# Patient Record
Sex: Male | Born: 1980 | Race: Black or African American | Hispanic: No | Marital: Single | Smoking: Former smoker
Health system: Southern US, Community
[De-identification: ages and names within clinical notes are randomized; demographics above are authoritative.]

## PROBLEM LIST (undated history)

## (undated) VITALS — BP 125/74 | HR 63 | Temp 98.1°F | Resp 18 | Ht 72.0 in | Wt 176.1 lb

## (undated) DIAGNOSIS — D72829 Elevated white blood cell count, unspecified: Secondary | ICD-10-CM

## (undated) DIAGNOSIS — R0902 Hypoxemia: Secondary | ICD-10-CM

## (undated) DIAGNOSIS — Z8619 Personal history of other infectious and parasitic diseases: Secondary | ICD-10-CM

## (undated) DIAGNOSIS — Z9114 Patient's other noncompliance with medication regimen: Secondary | ICD-10-CM

## (undated) DIAGNOSIS — F39 Unspecified mood [affective] disorder: Secondary | ICD-10-CM

## (undated) DIAGNOSIS — D5701 Hb-SS disease with acute chest syndrome: Secondary | ICD-10-CM

## (undated) DIAGNOSIS — I272 Pulmonary hypertension, unspecified: Secondary | ICD-10-CM

## (undated) DIAGNOSIS — E876 Hypokalemia: Secondary | ICD-10-CM

## (undated) DIAGNOSIS — F1290 Cannabis use, unspecified, uncomplicated: Secondary | ICD-10-CM

## (undated) DIAGNOSIS — D5700 Hb-SS disease with crisis, unspecified: Secondary | ICD-10-CM

## (undated) DIAGNOSIS — Z7722 Contact with and (suspected) exposure to environmental tobacco smoke (acute) (chronic): Secondary | ICD-10-CM

## (undated) DIAGNOSIS — Z87891 Personal history of nicotine dependence: Secondary | ICD-10-CM

## (undated) DIAGNOSIS — IMO0001 Reserved for inherently not codable concepts without codable children: Secondary | ICD-10-CM

## (undated) DIAGNOSIS — Q8901 Asplenia (congenital): Secondary | ICD-10-CM

## (undated) DIAGNOSIS — D730 Hyposplenism: Secondary | ICD-10-CM

## (undated) DIAGNOSIS — I10 Essential (primary) hypertension: Secondary | ICD-10-CM

## (undated) DIAGNOSIS — Z789 Other specified health status: Secondary | ICD-10-CM

## (undated) DIAGNOSIS — D571 Sickle-cell disease without crisis: Secondary | ICD-10-CM

## (undated) DIAGNOSIS — Z86711 Personal history of pulmonary embolism: Secondary | ICD-10-CM

## (undated) DIAGNOSIS — I82C11 Acute embolism and thrombosis of right internal jugular vein: Secondary | ICD-10-CM

## (undated) DIAGNOSIS — Z5189 Encounter for other specified aftercare: Secondary | ICD-10-CM

## (undated) DIAGNOSIS — D75839 Thrombocytosis, unspecified: Secondary | ICD-10-CM

## (undated) DIAGNOSIS — Z91148 Patient's other noncompliance with medication regimen for other reason: Secondary | ICD-10-CM

## (undated) DIAGNOSIS — I639 Cerebral infarction, unspecified: Secondary | ICD-10-CM

## (undated) DIAGNOSIS — I38 Endocarditis, valve unspecified: Secondary | ICD-10-CM

## (undated) DIAGNOSIS — D473 Essential (hemorrhagic) thrombocythemia: Secondary | ICD-10-CM

## (undated) DIAGNOSIS — Z7901 Long term (current) use of anticoagulants: Secondary | ICD-10-CM

## (undated) DIAGNOSIS — I248 Other forms of acute ischemic heart disease: Secondary | ICD-10-CM

## (undated) DIAGNOSIS — M8700 Idiopathic aseptic necrosis of unspecified bone: Secondary | ICD-10-CM

## (undated) HISTORY — PX: CHOLECYSTECTOMY: SHX55

## (undated) HISTORY — PX: UMBILICAL HERNIA REPAIR: SHX196

## (undated) HISTORY — DX: Other forms of acute ischemic heart disease: I24.8

## (undated) HISTORY — DX: Hb-SS disease with acute chest syndrome: D57.01

## (undated) HISTORY — PX: OTHER SURGICAL HISTORY: SHX169

---
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
## 1999-02-04 ENCOUNTER — Encounter: Payer: Self-pay | Admitting: Emergency Medicine

## 1999-02-05 ENCOUNTER — Encounter: Admission: RE | Admit: 1999-02-05 | Discharge: 1999-02-05 | Payer: Self-pay | Admitting: Family Medicine

## 1999-02-05 ENCOUNTER — Observation Stay (HOSPITAL_COMMUNITY): Admission: EM | Admit: 1999-02-05 | Discharge: 1999-02-05 | Payer: Self-pay | Admitting: Emergency Medicine

## 1999-02-08 ENCOUNTER — Encounter: Admission: RE | Admit: 1999-02-08 | Discharge: 1999-02-08 | Payer: Self-pay | Admitting: Family Medicine

## 1999-04-17 ENCOUNTER — Emergency Department (HOSPITAL_COMMUNITY): Admission: EM | Admit: 1999-04-17 | Discharge: 1999-04-17 | Payer: Self-pay | Admitting: *Deleted

## 1999-04-17 ENCOUNTER — Encounter: Payer: Self-pay | Admitting: *Deleted

## 1999-04-21 ENCOUNTER — Encounter: Admission: RE | Admit: 1999-04-21 | Discharge: 1999-04-21 | Payer: Self-pay | Admitting: Family Medicine

## 2000-04-09 ENCOUNTER — Emergency Department (HOSPITAL_COMMUNITY): Admission: EM | Admit: 2000-04-09 | Discharge: 2000-04-09 | Payer: Self-pay | Admitting: Emergency Medicine

## 2000-05-28 ENCOUNTER — Inpatient Hospital Stay (HOSPITAL_COMMUNITY): Admission: EM | Admit: 2000-05-28 | Discharge: 2000-05-30 | Payer: Self-pay

## 2002-04-28 ENCOUNTER — Encounter: Payer: Self-pay | Admitting: Emergency Medicine

## 2002-04-28 ENCOUNTER — Observation Stay (HOSPITAL_COMMUNITY): Admission: EM | Admit: 2002-04-28 | Discharge: 2002-04-29 | Payer: Self-pay

## 2002-05-09 ENCOUNTER — Encounter: Admission: RE | Admit: 2002-05-09 | Discharge: 2002-05-09 | Payer: Self-pay | Admitting: Family Medicine

## 2003-02-18 ENCOUNTER — Inpatient Hospital Stay (HOSPITAL_COMMUNITY): Admission: EM | Admit: 2003-02-18 | Discharge: 2003-02-19 | Payer: Self-pay

## 2003-03-08 ENCOUNTER — Emergency Department (HOSPITAL_COMMUNITY): Admission: AD | Admit: 2003-03-08 | Discharge: 2003-03-08 | Payer: Self-pay | Admitting: Internal Medicine

## 2003-06-16 ENCOUNTER — Emergency Department (HOSPITAL_COMMUNITY): Admission: EM | Admit: 2003-06-16 | Discharge: 2003-06-16 | Payer: Self-pay | Admitting: Emergency Medicine

## 2003-07-15 ENCOUNTER — Emergency Department (HOSPITAL_COMMUNITY): Admission: EM | Admit: 2003-07-15 | Discharge: 2003-07-15 | Payer: Self-pay | Admitting: Family Medicine

## 2003-07-17 ENCOUNTER — Emergency Department (HOSPITAL_COMMUNITY): Admission: EM | Admit: 2003-07-17 | Discharge: 2003-07-18 | Payer: Self-pay | Admitting: Emergency Medicine

## 2003-07-21 ENCOUNTER — Inpatient Hospital Stay (HOSPITAL_COMMUNITY): Admission: EM | Admit: 2003-07-21 | Discharge: 2003-07-22 | Payer: Self-pay | Admitting: *Deleted

## 2003-10-07 ENCOUNTER — Emergency Department (HOSPITAL_COMMUNITY): Admission: EM | Admit: 2003-10-07 | Discharge: 2003-10-08 | Payer: Self-pay | Admitting: Emergency Medicine

## 2003-10-09 ENCOUNTER — Emergency Department (HOSPITAL_COMMUNITY): Admission: EM | Admit: 2003-10-09 | Discharge: 2003-10-10 | Payer: Self-pay | Admitting: Emergency Medicine

## 2003-10-17 ENCOUNTER — Ambulatory Visit (HOSPITAL_COMMUNITY): Admission: RE | Admit: 2003-10-17 | Discharge: 2003-10-17 | Payer: Self-pay | Admitting: Emergency Medicine

## 2004-02-01 ENCOUNTER — Emergency Department (HOSPITAL_COMMUNITY): Admission: EM | Admit: 2004-02-01 | Discharge: 2004-02-02 | Payer: Self-pay | Admitting: Emergency Medicine

## 2004-02-07 ENCOUNTER — Emergency Department (HOSPITAL_COMMUNITY): Admission: EM | Admit: 2004-02-07 | Discharge: 2004-02-07 | Payer: Self-pay | Admitting: Emergency Medicine

## 2004-02-19 ENCOUNTER — Ambulatory Visit: Payer: Self-pay | Admitting: Family Medicine

## 2004-02-24 ENCOUNTER — Ambulatory Visit (HOSPITAL_COMMUNITY): Admission: RE | Admit: 2004-02-24 | Discharge: 2004-02-24 | Payer: Self-pay | Admitting: *Deleted

## 2004-03-18 ENCOUNTER — Inpatient Hospital Stay (HOSPITAL_COMMUNITY): Admission: EM | Admit: 2004-03-18 | Discharge: 2004-03-20 | Payer: Self-pay | Admitting: Emergency Medicine

## 2004-03-23 ENCOUNTER — Ambulatory Visit: Payer: Self-pay | Admitting: Family Medicine

## 2004-03-25 ENCOUNTER — Inpatient Hospital Stay (HOSPITAL_COMMUNITY): Admission: EM | Admit: 2004-03-25 | Discharge: 2004-03-30 | Payer: Self-pay | Admitting: Emergency Medicine

## 2004-04-28 ENCOUNTER — Emergency Department (HOSPITAL_COMMUNITY): Admission: EM | Admit: 2004-04-28 | Discharge: 2004-04-28 | Payer: Self-pay | Admitting: *Deleted

## 2004-04-28 ENCOUNTER — Emergency Department (HOSPITAL_COMMUNITY): Admission: EM | Admit: 2004-04-28 | Discharge: 2004-04-28 | Payer: Self-pay | Admitting: Emergency Medicine

## 2004-05-01 ENCOUNTER — Emergency Department (HOSPITAL_COMMUNITY): Admission: EM | Admit: 2004-05-01 | Discharge: 2004-05-01 | Payer: Self-pay | Admitting: Emergency Medicine

## 2004-05-26 ENCOUNTER — Ambulatory Visit: Payer: Self-pay | Admitting: Cardiology

## 2004-05-26 ENCOUNTER — Ambulatory Visit: Payer: Self-pay | Admitting: Internal Medicine

## 2004-05-26 ENCOUNTER — Encounter: Payer: Self-pay | Admitting: Cardiology

## 2004-05-26 ENCOUNTER — Inpatient Hospital Stay (HOSPITAL_COMMUNITY): Admission: EM | Admit: 2004-05-26 | Discharge: 2004-05-28 | Payer: Self-pay | Admitting: Emergency Medicine

## 2004-08-13 ENCOUNTER — Emergency Department (HOSPITAL_COMMUNITY): Admission: EM | Admit: 2004-08-13 | Discharge: 2004-08-13 | Payer: Self-pay | Admitting: Emergency Medicine

## 2004-08-18 ENCOUNTER — Inpatient Hospital Stay (HOSPITAL_COMMUNITY): Admission: AD | Admit: 2004-08-18 | Discharge: 2004-08-20 | Payer: Self-pay | Admitting: Family Medicine

## 2004-08-18 ENCOUNTER — Encounter: Payer: Self-pay | Admitting: Emergency Medicine

## 2004-09-23 ENCOUNTER — Encounter: Admission: RE | Admit: 2004-09-23 | Discharge: 2004-09-23 | Payer: Self-pay | Admitting: Orthopaedic Surgery

## 2004-09-29 ENCOUNTER — Ambulatory Visit: Payer: Self-pay | Admitting: Family Medicine

## 2004-10-05 ENCOUNTER — Encounter: Admission: RE | Admit: 2004-10-05 | Discharge: 2004-10-05 | Payer: Self-pay | Admitting: Orthopaedic Surgery

## 2004-10-24 ENCOUNTER — Emergency Department (HOSPITAL_COMMUNITY): Admission: EM | Admit: 2004-10-24 | Discharge: 2004-10-25 | Payer: Self-pay | Admitting: Emergency Medicine

## 2004-11-10 ENCOUNTER — Emergency Department (HOSPITAL_COMMUNITY): Admission: EM | Admit: 2004-11-10 | Discharge: 2004-11-10 | Payer: Self-pay | Admitting: Emergency Medicine

## 2004-11-20 ENCOUNTER — Emergency Department (HOSPITAL_COMMUNITY): Admission: EM | Admit: 2004-11-20 | Discharge: 2004-11-20 | Payer: Self-pay | Admitting: Emergency Medicine

## 2004-11-26 ENCOUNTER — Observation Stay (HOSPITAL_COMMUNITY): Admission: EM | Admit: 2004-11-26 | Discharge: 2004-11-27 | Payer: Self-pay | Admitting: Emergency Medicine

## 2004-11-28 ENCOUNTER — Inpatient Hospital Stay (HOSPITAL_COMMUNITY): Admission: EM | Admit: 2004-11-28 | Discharge: 2004-12-01 | Payer: Self-pay | Admitting: Emergency Medicine

## 2004-12-11 ENCOUNTER — Emergency Department (HOSPITAL_COMMUNITY): Admission: EM | Admit: 2004-12-11 | Discharge: 2004-12-11 | Payer: Self-pay | Admitting: Emergency Medicine

## 2004-12-28 ENCOUNTER — Emergency Department (HOSPITAL_COMMUNITY): Admission: EM | Admit: 2004-12-28 | Discharge: 2004-12-29 | Payer: Self-pay | Admitting: Emergency Medicine

## 2005-01-21 ENCOUNTER — Emergency Department (HOSPITAL_COMMUNITY): Admission: EM | Admit: 2005-01-21 | Discharge: 2005-01-21 | Payer: Self-pay | Admitting: Emergency Medicine

## 2005-02-11 ENCOUNTER — Emergency Department (HOSPITAL_COMMUNITY): Admission: EM | Admit: 2005-02-11 | Discharge: 2005-02-11 | Payer: Self-pay | Admitting: Emergency Medicine

## 2005-02-14 ENCOUNTER — Emergency Department (HOSPITAL_COMMUNITY): Admission: EM | Admit: 2005-02-14 | Discharge: 2005-02-14 | Payer: Self-pay | Admitting: Emergency Medicine

## 2005-03-11 ENCOUNTER — Emergency Department (HOSPITAL_COMMUNITY): Admission: EM | Admit: 2005-03-11 | Discharge: 2005-03-11 | Payer: Self-pay | Admitting: Emergency Medicine

## 2005-03-13 ENCOUNTER — Emergency Department (HOSPITAL_COMMUNITY): Admission: EM | Admit: 2005-03-13 | Discharge: 2005-03-13 | Payer: Self-pay | Admitting: Emergency Medicine

## 2005-03-16 ENCOUNTER — Emergency Department (HOSPITAL_COMMUNITY): Admission: EM | Admit: 2005-03-16 | Discharge: 2005-03-16 | Payer: Self-pay | Admitting: *Deleted

## 2005-03-28 ENCOUNTER — Emergency Department (HOSPITAL_COMMUNITY): Admission: EM | Admit: 2005-03-28 | Discharge: 2005-03-28 | Payer: Self-pay | Admitting: Emergency Medicine

## 2005-03-30 ENCOUNTER — Emergency Department (HOSPITAL_COMMUNITY): Admission: EM | Admit: 2005-03-30 | Discharge: 2005-03-30 | Payer: Self-pay | Admitting: Emergency Medicine

## 2005-04-15 ENCOUNTER — Emergency Department (HOSPITAL_COMMUNITY): Admission: EM | Admit: 2005-04-15 | Discharge: 2005-04-15 | Payer: Self-pay | Admitting: Emergency Medicine

## 2005-04-28 ENCOUNTER — Emergency Department (HOSPITAL_COMMUNITY): Admission: EM | Admit: 2005-04-28 | Discharge: 2005-04-28 | Payer: Self-pay | Admitting: Emergency Medicine

## 2005-05-16 ENCOUNTER — Emergency Department (HOSPITAL_COMMUNITY): Admission: EM | Admit: 2005-05-16 | Discharge: 2005-05-16 | Payer: Self-pay | Admitting: Emergency Medicine

## 2005-05-18 ENCOUNTER — Encounter: Admission: RE | Admit: 2005-05-18 | Discharge: 2005-05-18 | Payer: Self-pay | Admitting: Orthopaedic Surgery

## 2005-05-22 ENCOUNTER — Inpatient Hospital Stay (HOSPITAL_COMMUNITY): Admission: EM | Admit: 2005-05-22 | Discharge: 2005-05-28 | Payer: Self-pay | Admitting: Emergency Medicine

## 2005-05-26 ENCOUNTER — Encounter (INDEPENDENT_AMBULATORY_CARE_PROVIDER_SITE_OTHER): Payer: Self-pay | Admitting: *Deleted

## 2005-06-01 ENCOUNTER — Encounter: Payer: Self-pay | Admitting: Family Medicine

## 2005-06-11 ENCOUNTER — Emergency Department (HOSPITAL_COMMUNITY): Admission: EM | Admit: 2005-06-11 | Discharge: 2005-06-11 | Payer: Self-pay | Admitting: Emergency Medicine

## 2005-06-16 ENCOUNTER — Emergency Department (HOSPITAL_COMMUNITY): Admission: EM | Admit: 2005-06-16 | Discharge: 2005-06-16 | Payer: Self-pay | Admitting: Emergency Medicine

## 2005-06-30 ENCOUNTER — Emergency Department (HOSPITAL_COMMUNITY): Admission: EM | Admit: 2005-06-30 | Discharge: 2005-06-30 | Payer: Self-pay | Admitting: Emergency Medicine

## 2005-07-14 ENCOUNTER — Emergency Department (HOSPITAL_COMMUNITY): Admission: EM | Admit: 2005-07-14 | Discharge: 2005-07-14 | Payer: Self-pay | Admitting: Emergency Medicine

## 2005-08-02 ENCOUNTER — Emergency Department (HOSPITAL_COMMUNITY): Admission: EM | Admit: 2005-08-02 | Discharge: 2005-08-03 | Payer: Self-pay | Admitting: Emergency Medicine

## 2005-08-25 ENCOUNTER — Emergency Department (HOSPITAL_COMMUNITY): Admission: EM | Admit: 2005-08-25 | Discharge: 2005-08-25 | Payer: Self-pay | Admitting: Emergency Medicine

## 2005-08-30 ENCOUNTER — Emergency Department (HOSPITAL_COMMUNITY): Admission: EM | Admit: 2005-08-30 | Discharge: 2005-08-30 | Payer: Self-pay | Admitting: Emergency Medicine

## 2005-09-15 ENCOUNTER — Emergency Department (HOSPITAL_COMMUNITY): Admission: EM | Admit: 2005-09-15 | Discharge: 2005-09-15 | Payer: Self-pay | Admitting: Emergency Medicine

## 2005-09-28 ENCOUNTER — Emergency Department (HOSPITAL_COMMUNITY): Admission: EM | Admit: 2005-09-28 | Discharge: 2005-09-29 | Payer: Self-pay | Admitting: Emergency Medicine

## 2005-11-11 ENCOUNTER — Emergency Department (HOSPITAL_COMMUNITY): Admission: EM | Admit: 2005-11-11 | Discharge: 2005-11-12 | Payer: Self-pay | Admitting: Emergency Medicine

## 2005-12-05 ENCOUNTER — Emergency Department (HOSPITAL_COMMUNITY): Admission: EM | Admit: 2005-12-05 | Discharge: 2005-12-06 | Payer: Self-pay | Admitting: Emergency Medicine

## 2006-01-06 ENCOUNTER — Emergency Department (HOSPITAL_COMMUNITY): Admission: EM | Admit: 2006-01-06 | Discharge: 2006-01-06 | Payer: Self-pay | Admitting: Emergency Medicine

## 2006-01-07 ENCOUNTER — Emergency Department (HOSPITAL_COMMUNITY): Admission: EM | Admit: 2006-01-07 | Discharge: 2006-01-07 | Payer: Self-pay | Admitting: Emergency Medicine

## 2006-01-27 ENCOUNTER — Emergency Department (HOSPITAL_COMMUNITY): Admission: EM | Admit: 2006-01-27 | Discharge: 2006-01-28 | Payer: Self-pay | Admitting: Emergency Medicine

## 2006-02-24 ENCOUNTER — Emergency Department (HOSPITAL_COMMUNITY): Admission: EM | Admit: 2006-02-24 | Discharge: 2006-02-24 | Payer: Self-pay | Admitting: Emergency Medicine

## 2006-03-06 ENCOUNTER — Emergency Department (HOSPITAL_COMMUNITY): Admission: EM | Admit: 2006-03-06 | Discharge: 2006-03-06 | Payer: Self-pay | Admitting: Emergency Medicine

## 2006-03-19 ENCOUNTER — Emergency Department (HOSPITAL_COMMUNITY): Admission: EM | Admit: 2006-03-19 | Discharge: 2006-03-19 | Payer: Self-pay | Admitting: Emergency Medicine

## 2006-03-22 ENCOUNTER — Emergency Department (HOSPITAL_COMMUNITY): Admission: EM | Admit: 2006-03-22 | Discharge: 2006-03-22 | Payer: Self-pay | Admitting: Emergency Medicine

## 2006-04-24 ENCOUNTER — Emergency Department (HOSPITAL_COMMUNITY): Admission: EM | Admit: 2006-04-24 | Discharge: 2006-04-24 | Payer: Self-pay | Admitting: Emergency Medicine

## 2006-04-26 ENCOUNTER — Encounter: Admission: RE | Admit: 2006-04-26 | Discharge: 2006-04-26 | Payer: Self-pay | Admitting: Orthopedic Surgery

## 2006-04-29 ENCOUNTER — Emergency Department (HOSPITAL_COMMUNITY): Admission: EM | Admit: 2006-04-29 | Discharge: 2006-04-29 | Payer: Self-pay | Admitting: Emergency Medicine

## 2006-05-10 ENCOUNTER — Emergency Department (HOSPITAL_COMMUNITY): Admission: EM | Admit: 2006-05-10 | Discharge: 2006-05-11 | Payer: Self-pay | Admitting: Emergency Medicine

## 2006-05-18 ENCOUNTER — Emergency Department (HOSPITAL_COMMUNITY): Admission: EM | Admit: 2006-05-18 | Discharge: 2006-05-18 | Payer: Self-pay | Admitting: Emergency Medicine

## 2006-06-03 ENCOUNTER — Emergency Department (HOSPITAL_COMMUNITY): Admission: EM | Admit: 2006-06-03 | Discharge: 2006-06-03 | Payer: Self-pay | Admitting: Emergency Medicine

## 2006-06-11 ENCOUNTER — Emergency Department (HOSPITAL_COMMUNITY): Admission: EM | Admit: 2006-06-11 | Discharge: 2006-06-11 | Payer: Self-pay | Admitting: Emergency Medicine

## 2006-06-13 IMAGING — CR DG CHEST 2V
2 series · 2 of 2 positions shown · non-contrast
Comparison: none

CLINICAL DATA: Chest pain; sickle cell crisis
 CHEST TWO VIEWS 
 The heart is enlarged.  The vascularity is normal and the lungs are clear.  
 IMPRESSION
 No active disease.

[view not recorded (1 of 2)]
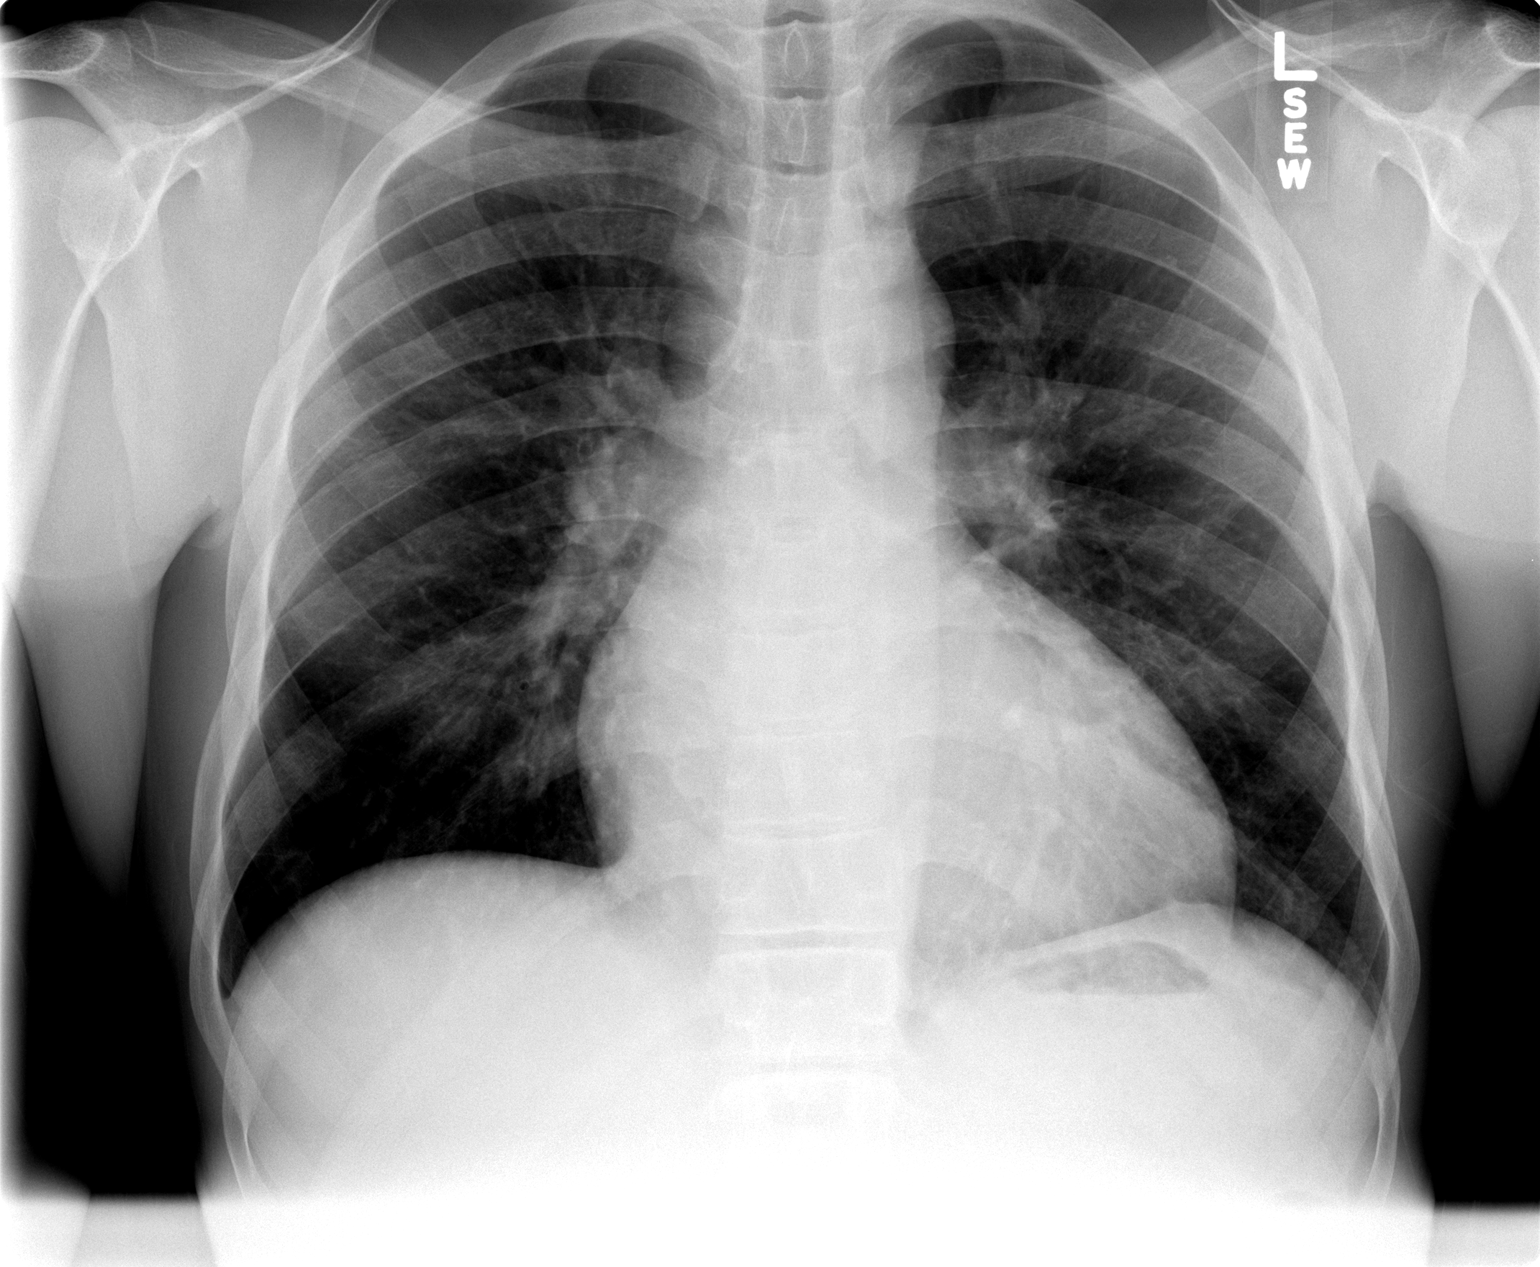

[view not recorded (2 of 2)]
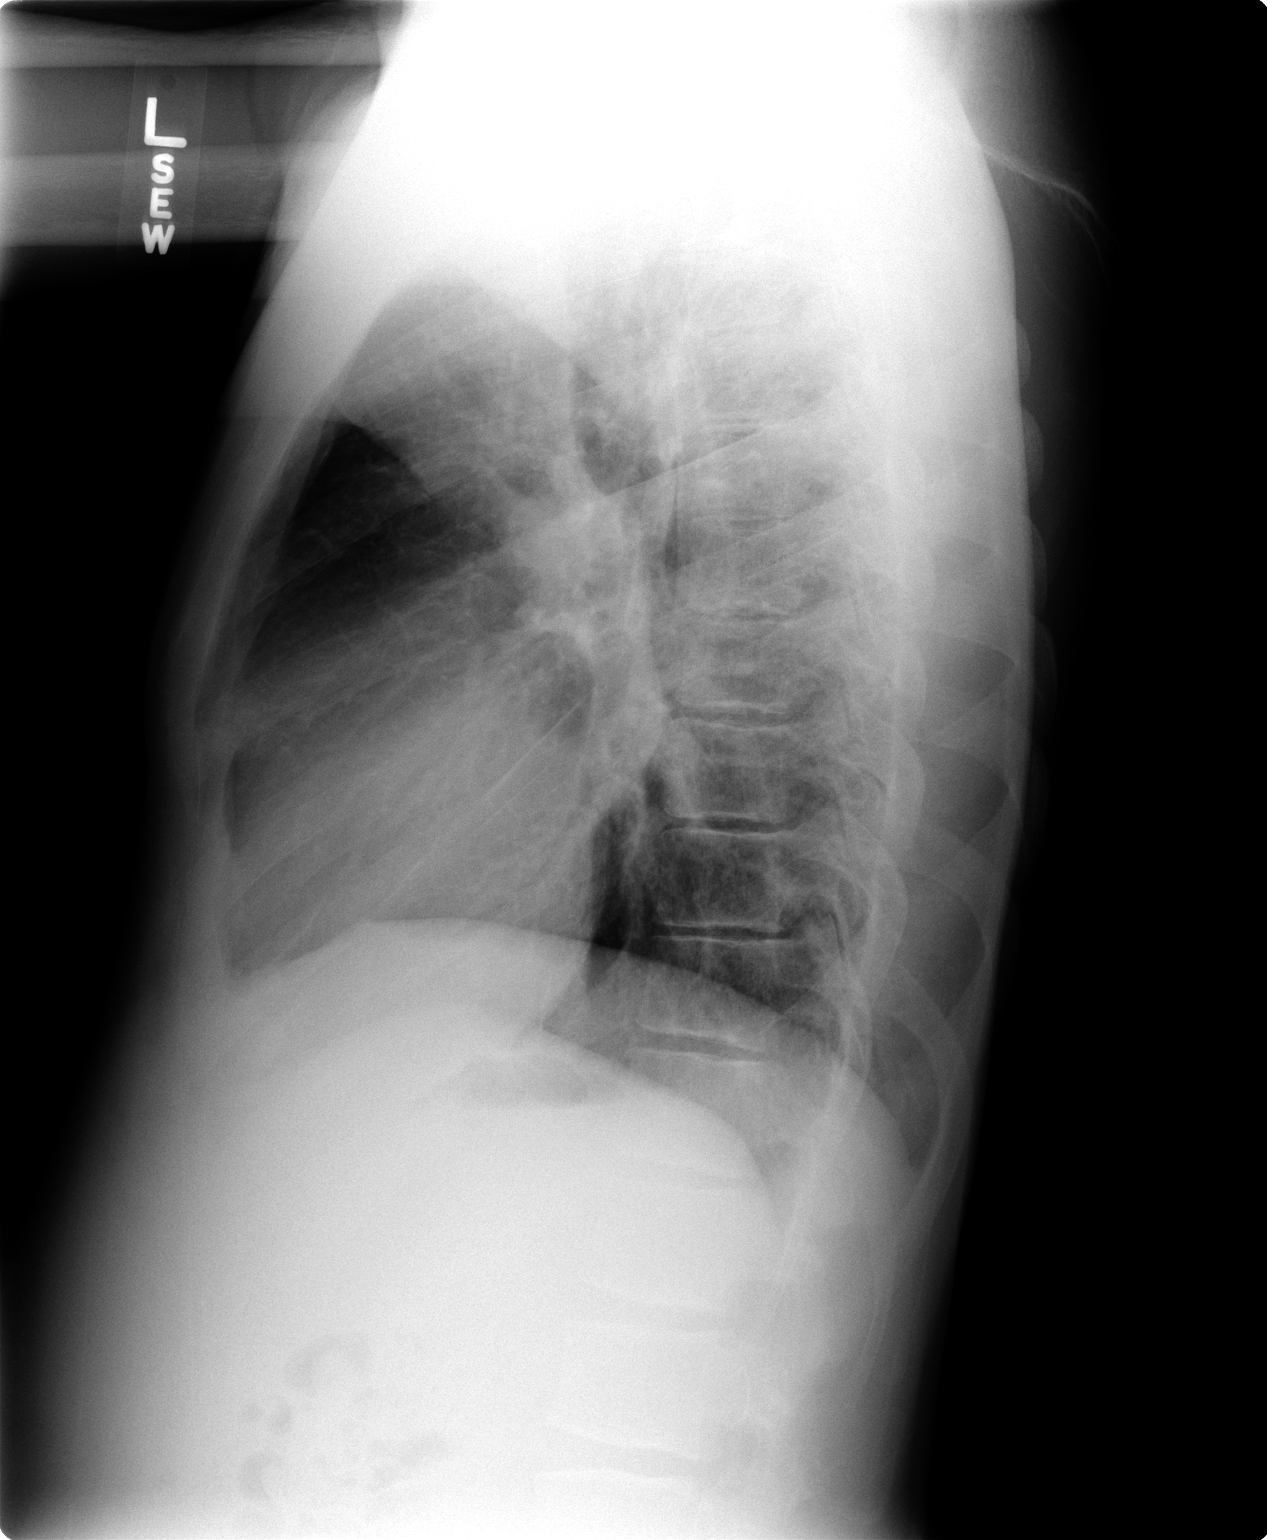

[2 of 2 positions shown; findings below may reference images not displayed]

## 2006-06-26 ENCOUNTER — Emergency Department (HOSPITAL_COMMUNITY): Admission: EM | Admit: 2006-06-26 | Discharge: 2006-06-26 | Payer: Self-pay | Admitting: *Deleted

## 2006-07-03 ENCOUNTER — Emergency Department (HOSPITAL_COMMUNITY): Admission: EM | Admit: 2006-07-03 | Discharge: 2006-07-03 | Payer: Self-pay | Admitting: Emergency Medicine

## 2006-07-14 ENCOUNTER — Emergency Department (HOSPITAL_COMMUNITY): Admission: EM | Admit: 2006-07-14 | Discharge: 2006-07-14 | Payer: Self-pay | Admitting: Emergency Medicine

## 2006-08-03 ENCOUNTER — Ambulatory Visit: Payer: Self-pay | Admitting: Family Medicine

## 2006-08-03 ENCOUNTER — Inpatient Hospital Stay (HOSPITAL_COMMUNITY): Admission: RE | Admit: 2006-08-03 | Discharge: 2006-08-06 | Payer: Self-pay | Admitting: Orthopaedic Surgery

## 2006-08-30 IMAGING — CR DG HIP COMPLETE 2+V*R*
3 series · 3 of 3 positions shown · non-contrast
Comparison: none

CLINICAL DATA: Right hip surgery.  Right hip pain.  No known injury. 
 COMPLETE RIGHT HIP

[view not recorded (1 of 3)]
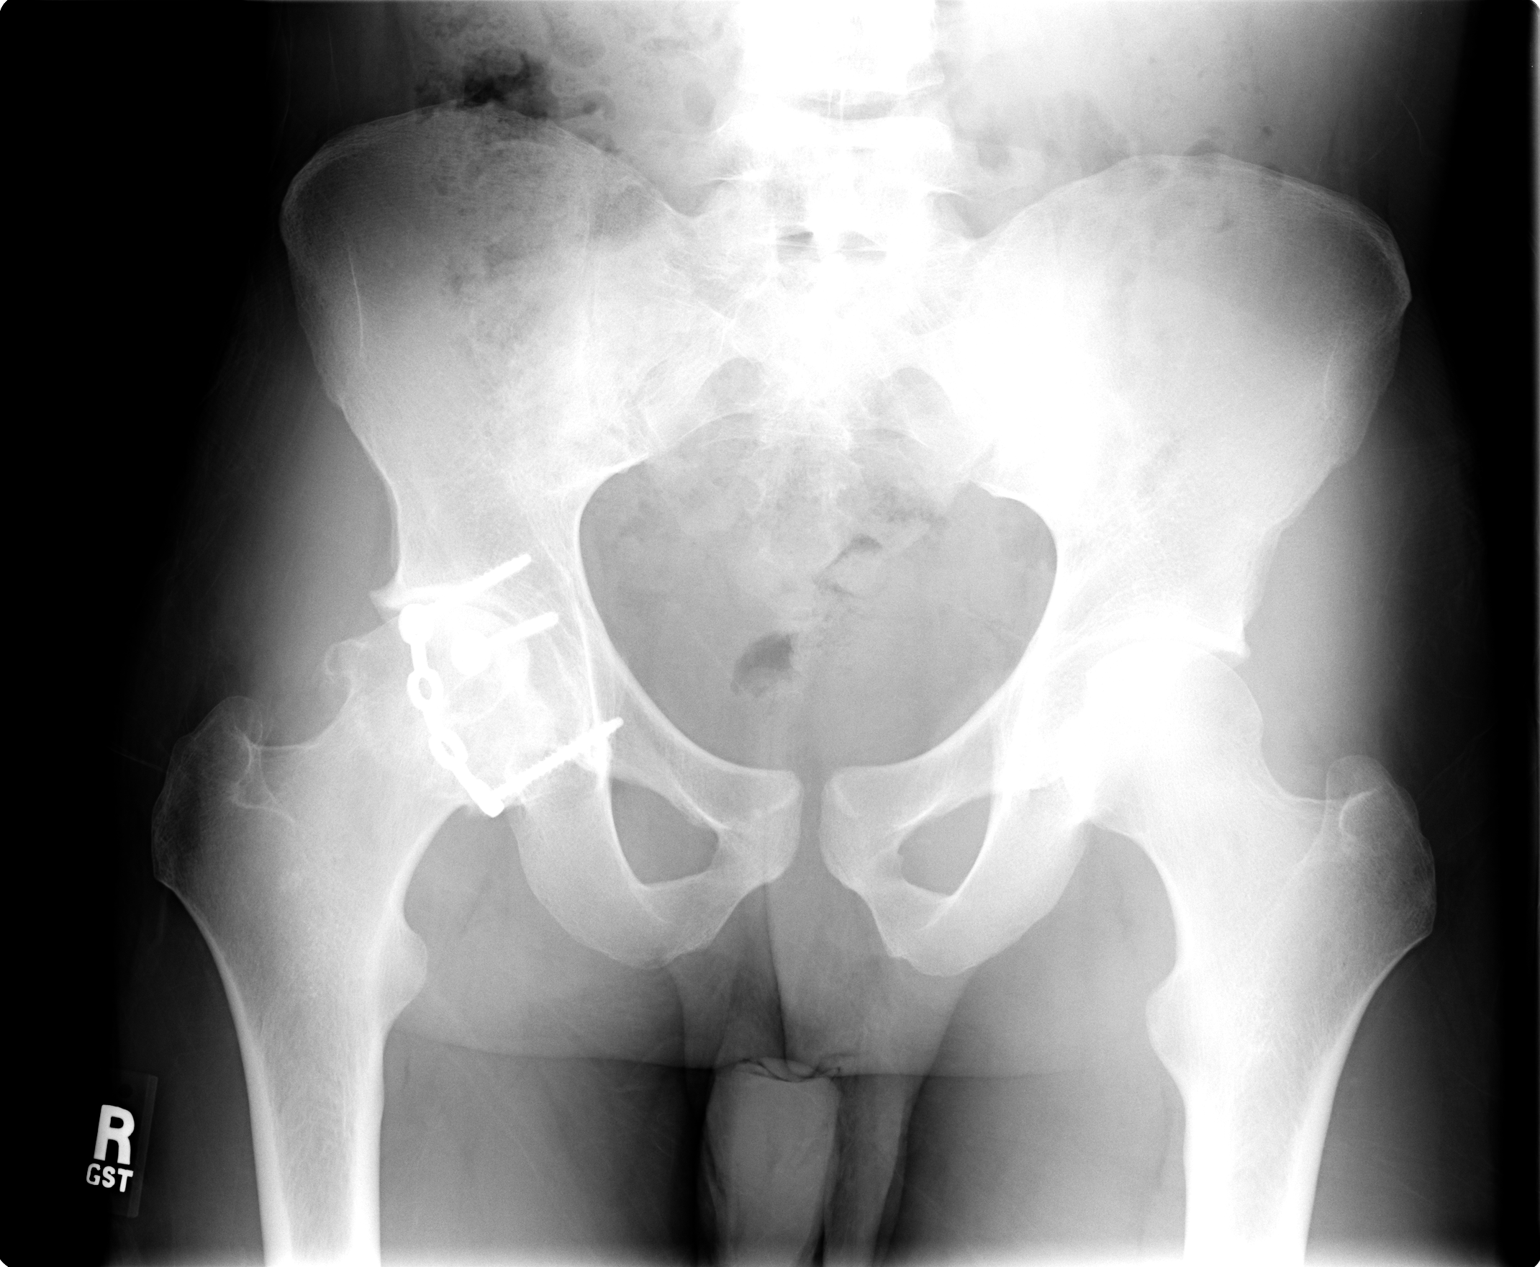

[view not recorded (2 of 3)]
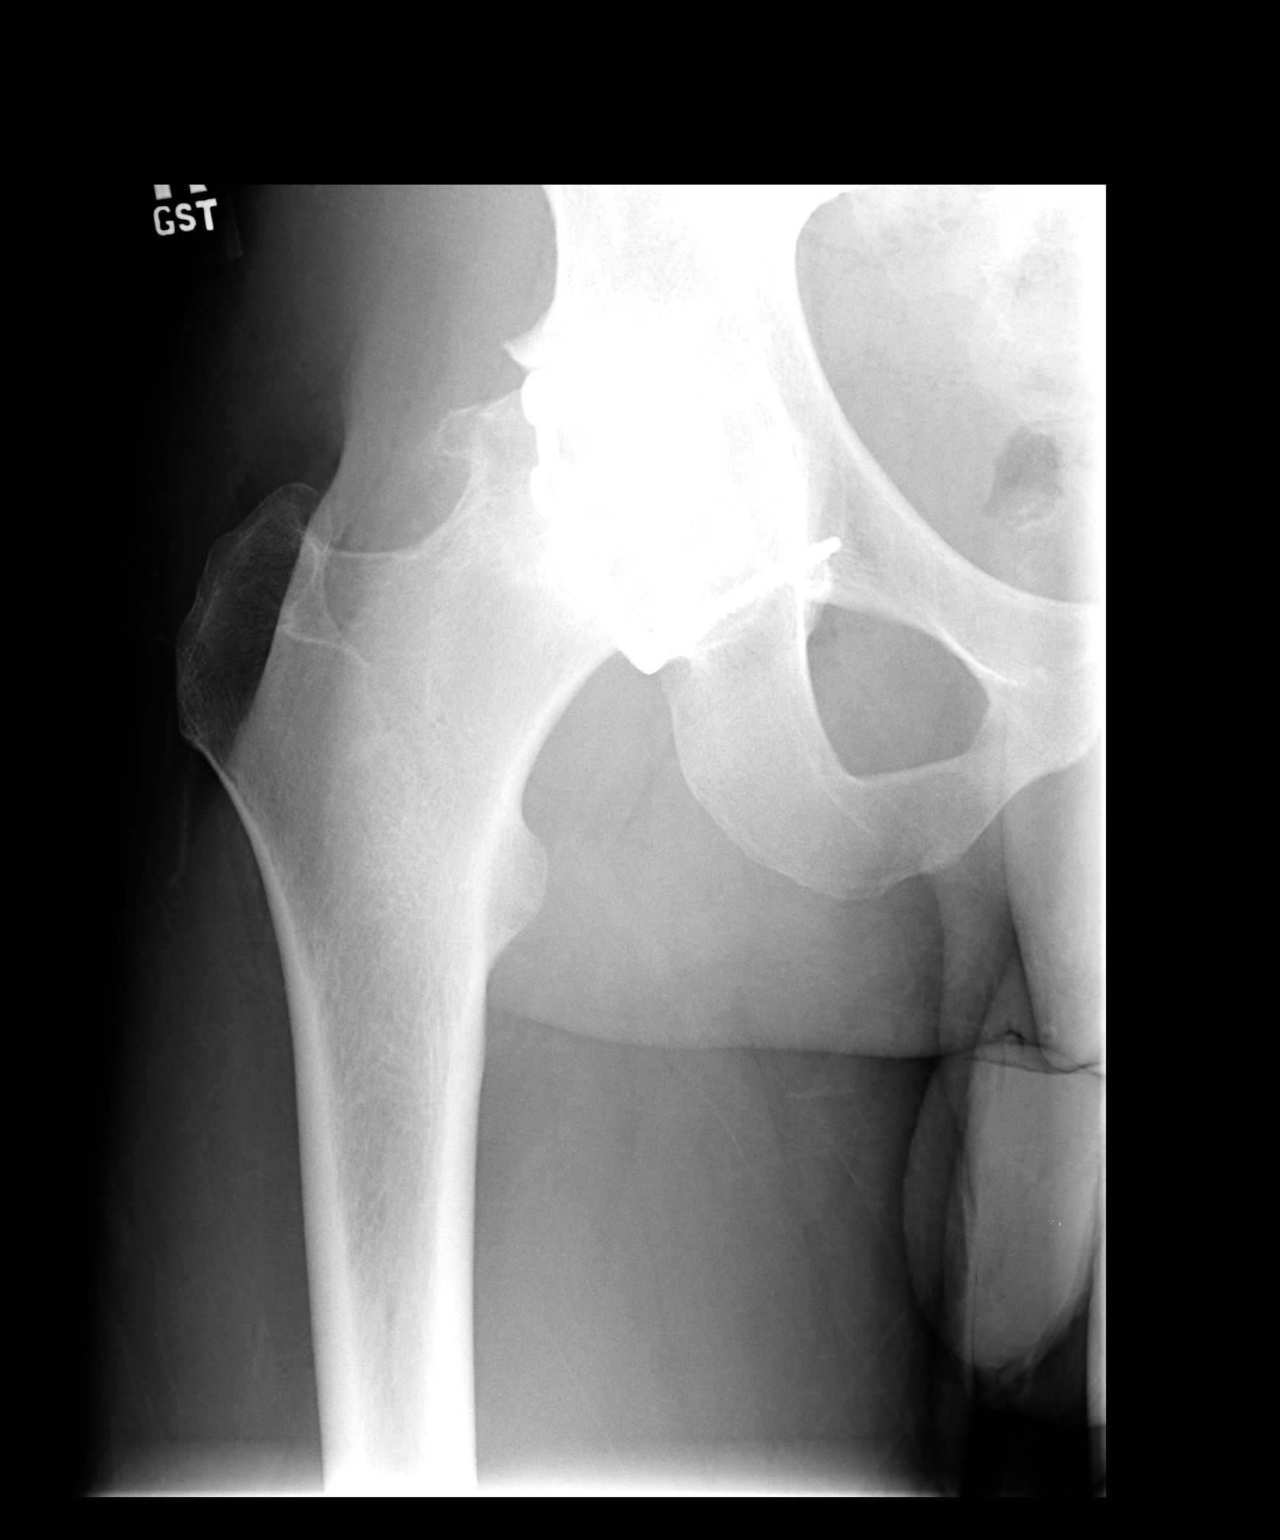

[view not recorded (3 of 3)]
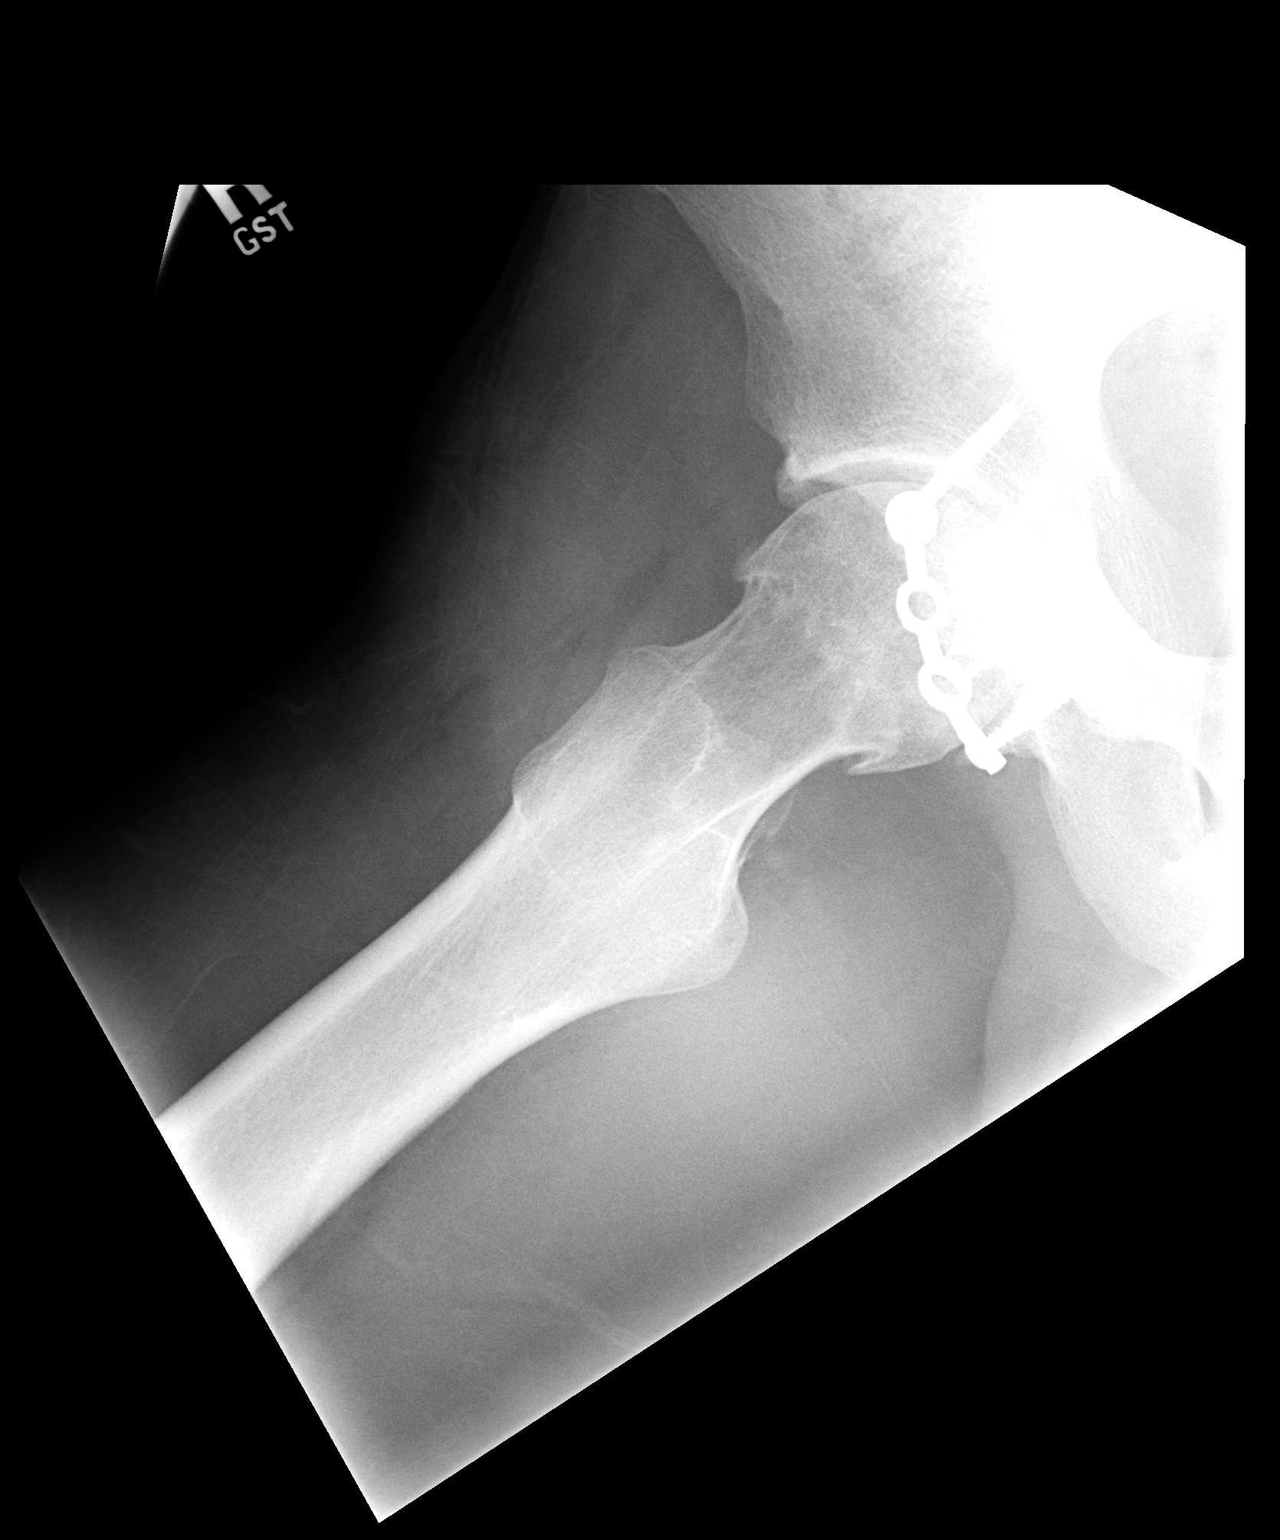

[3 of 3 positions shown; findings below may reference images not displayed]

FINDINGS: Internal fixation right acetabulum noted.  Moderate severe degenerative changes in the right hip present without definite acute abnormality. 
 IMPRESSION
 No definite acute abnormality. 
 Post-traumatic postoperative changes in the right hip with moderate to severe degenerative changes.

## 2006-09-09 IMAGING — CR DG HIP COMPLETE 2+V*R*
3 series · 3 of 3 positions shown · non-contrast
Comparison: none

CLINICAL DATA: Right hip pain for several days.  Prior fixation of fracture.
3 PHASE BONE SCAN
The patient was injected with 26.1 mCi 9c-99m MDP intravenously, and 3-phase bone scan of the pelvis and hips was obtained.  On the flow phase, no abnormality is seen.  On soft tissue phase, no abnormality is noted in either hip.  On the delayed images, there is minimal activity in the right hip, particularly in the inferior aspect of the right subcapital femur.  Plain films of the right hip will be obtained.
IMPRESSION
Minimal activity in the right hip may be related to prior fixation.  Plain films of the right hip to be obtained.
TWO VIEW RIGHT HIP
Two views of the right hip and a view of the pelvis were obtained.  Plate and screw fixation of the right acetabulum is noted with degenerative change in the right hip.  The degenerative change does account for the faintly increased activity in the right hip on 3 phase bone scan.
Degenerative change in right hip secondary to prior trauma and fixation does account for the faint increased activity on today?s bone scan.

[view not recorded (1 of 3)]
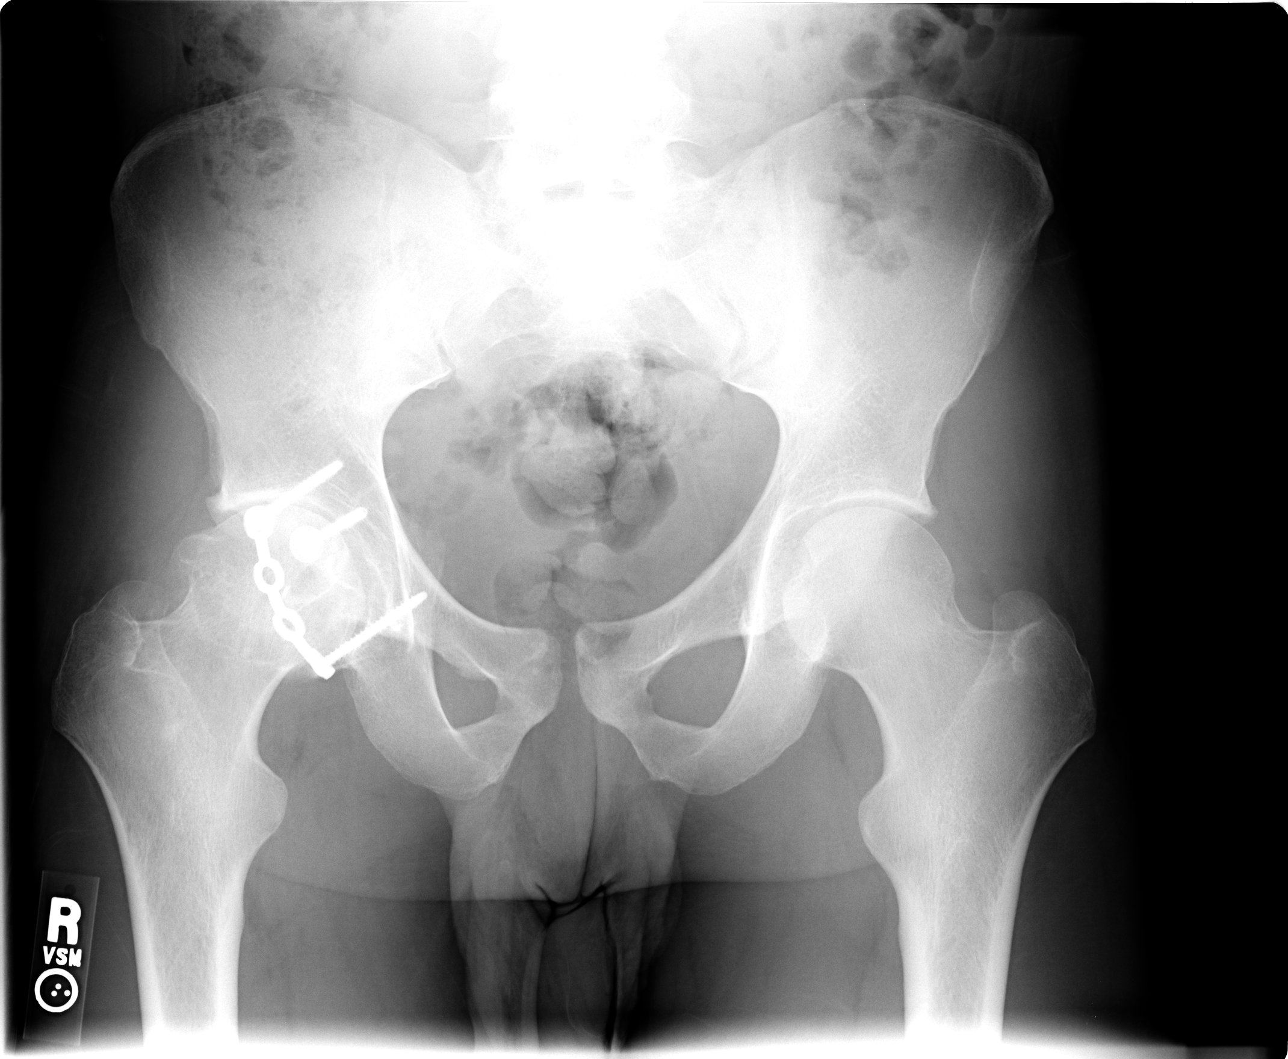

[view not recorded (2 of 3)]
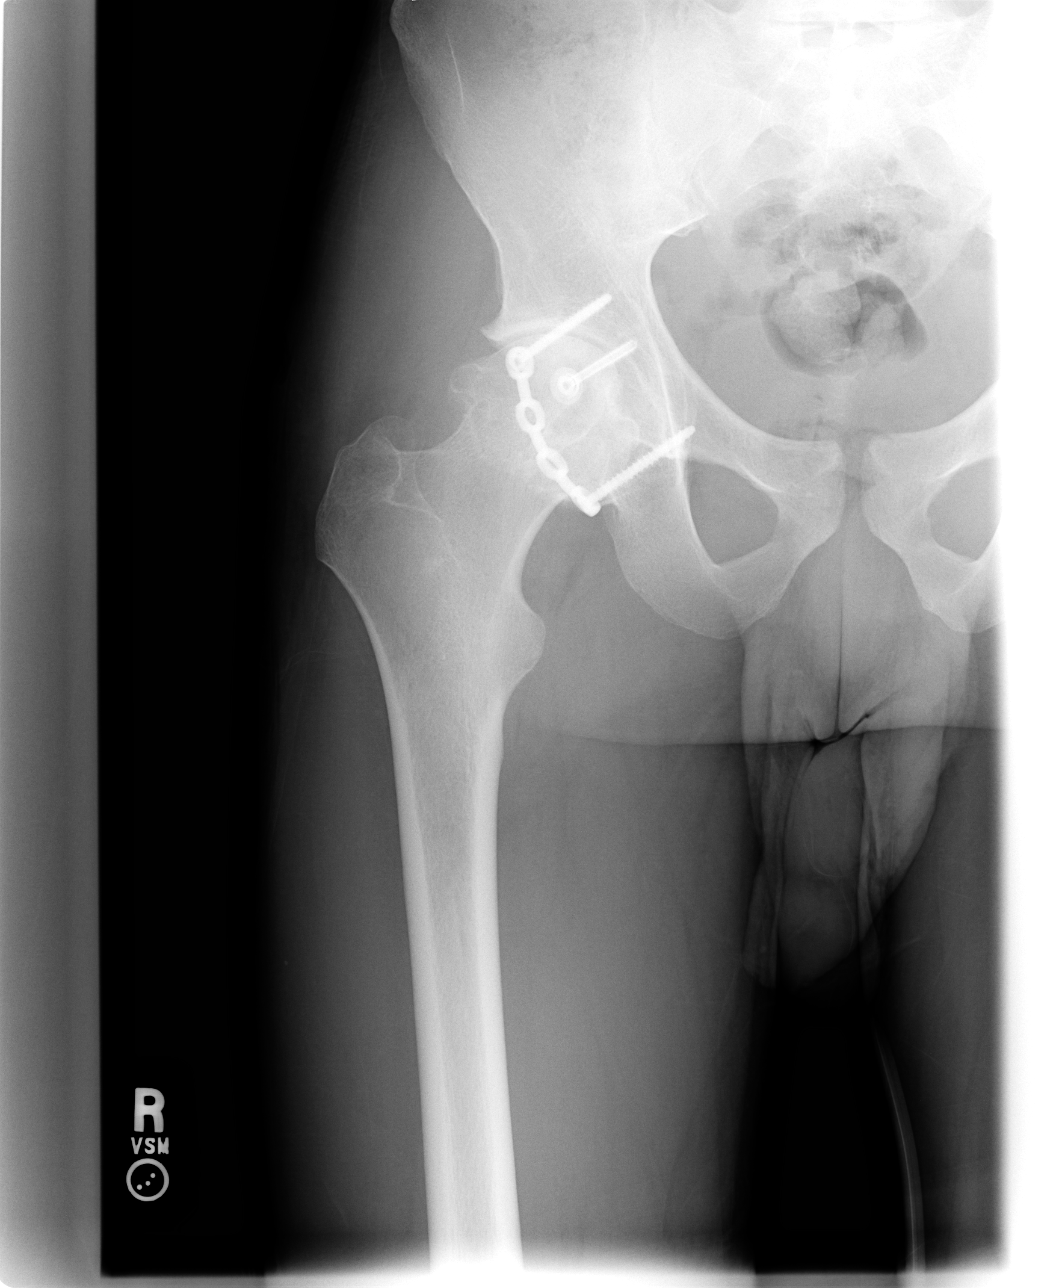

[view not recorded (3 of 3)]
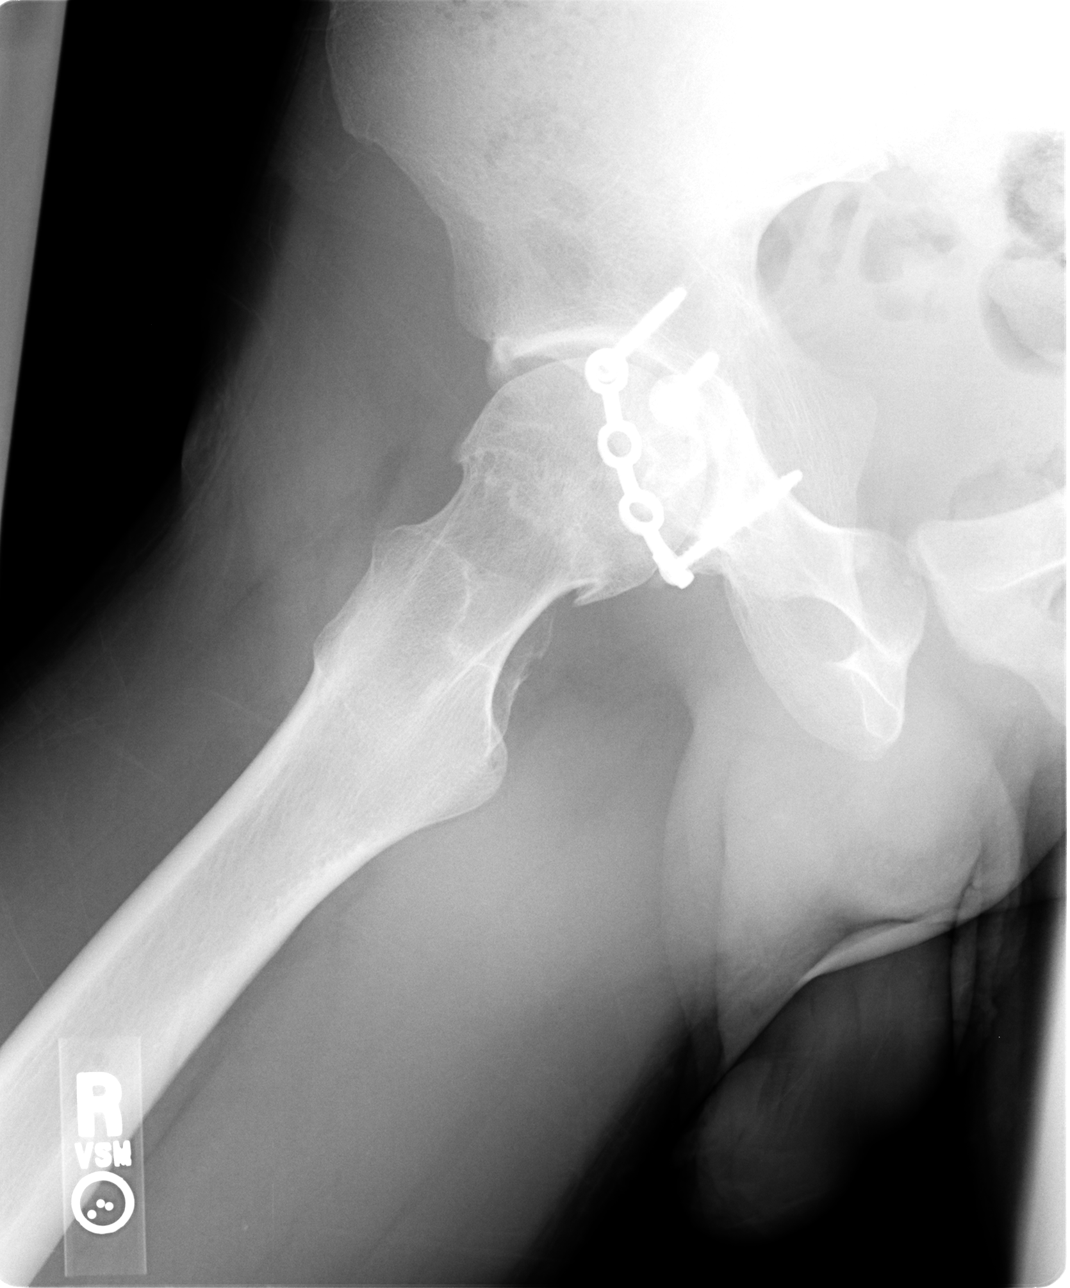

[3 of 3 positions shown; findings below may reference images not displayed]

## 2006-09-09 IMAGING — NM NM BONE 3 PHASE
2 series · 12 of 12 positions shown · non-contrast
Comparison: none

CLINICAL DATA: Right hip pain for several days.  Prior fixation of fracture.
3 PHASE BONE SCAN
The patient was injected with 26.1 mCi 9c-99m MDP intravenously, and 3-phase bone scan of the pelvis and hips was obtained.  On the flow phase, no abnormality is seen.  On soft tissue phase, no abnormality is noted in either hip.  On the delayed images, there is minimal activity in the right hip, particularly in the inferior aspect of the right subcapital femur.  Plain films of the right hip will be obtained.
IMPRESSION
Minimal activity in the right hip may be related to prior fixation.  Plain films of the right hip to be obtained.
TWO VIEW RIGHT HIP
Two views of the right hip and a view of the pelvis were obtained.  Plate and screw fixation of the right acetabulum is noted with degenerative change in the right hip.  The degenerative change does account for the faintly increased activity in the right hip on 3 phase bone scan.
Degenerative change in right hip secondary to prior trauma and fixation does account for the faint increased activity on today?s bone scan.

[Series 1: bf bone flow · 9.92mm/px · 6 of 24 frames shown (1 of 2)]
[frame 3/24]
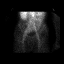
[frame 7/24]
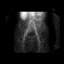
[frame 11/24]
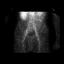
[frame 15/24]
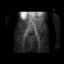
[frame 19/24]
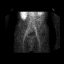
[frame 23/24]
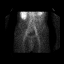

[Series 1: bf bone flow · 9.87mm/px · 6 of 24 frames shown (2 of 2)]
[frame 3/24]
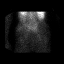
[frame 7/24]
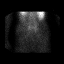
[frame 11/24]
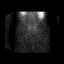
[frame 15/24]
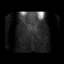
[frame 19/24]
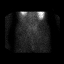
[frame 23/24]
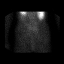

[12 of 12 positions shown; findings below may reference images not displayed]

## 2006-09-18 ENCOUNTER — Emergency Department (HOSPITAL_COMMUNITY): Admission: EM | Admit: 2006-09-18 | Discharge: 2006-09-18 | Payer: Self-pay | Admitting: Emergency Medicine

## 2006-10-03 ENCOUNTER — Emergency Department (HOSPITAL_COMMUNITY): Admission: EM | Admit: 2006-10-03 | Discharge: 2006-10-03 | Payer: Self-pay | Admitting: Emergency Medicine

## 2006-10-14 ENCOUNTER — Emergency Department (HOSPITAL_COMMUNITY): Admission: EM | Admit: 2006-10-14 | Discharge: 2006-10-14 | Payer: Self-pay | Admitting: Emergency Medicine

## 2006-11-06 ENCOUNTER — Emergency Department (HOSPITAL_COMMUNITY): Admission: EM | Admit: 2006-11-06 | Discharge: 2006-11-06 | Payer: Self-pay | Admitting: Emergency Medicine

## 2006-11-15 ENCOUNTER — Inpatient Hospital Stay (HOSPITAL_COMMUNITY): Admission: EM | Admit: 2006-11-15 | Discharge: 2006-11-15 | Payer: Self-pay | Admitting: Emergency Medicine

## 2006-12-11 ENCOUNTER — Emergency Department (HOSPITAL_COMMUNITY): Admission: EM | Admit: 2006-12-11 | Discharge: 2006-12-12 | Payer: Self-pay | Admitting: Emergency Medicine

## 2006-12-14 ENCOUNTER — Inpatient Hospital Stay (HOSPITAL_COMMUNITY): Admission: EM | Admit: 2006-12-14 | Discharge: 2006-12-21 | Payer: Self-pay | Admitting: Emergency Medicine

## 2006-12-26 IMAGING — CR DG HIP COMPLETE 2+V*R*
3 series · 3 of 3 positions shown · non-contrast
Comparison: none

HISTORY: Pain

RIGHT HIP 2 VIEWS:
Prior right acetabular surgery, stable since 10/17/2003.
Right hip joint space narrowing and spur formation femoral head.
No acute fracture or dislocation.
Mineralization normal.
No acute bone abnormality seen.

[view not recorded (1 of 3)]
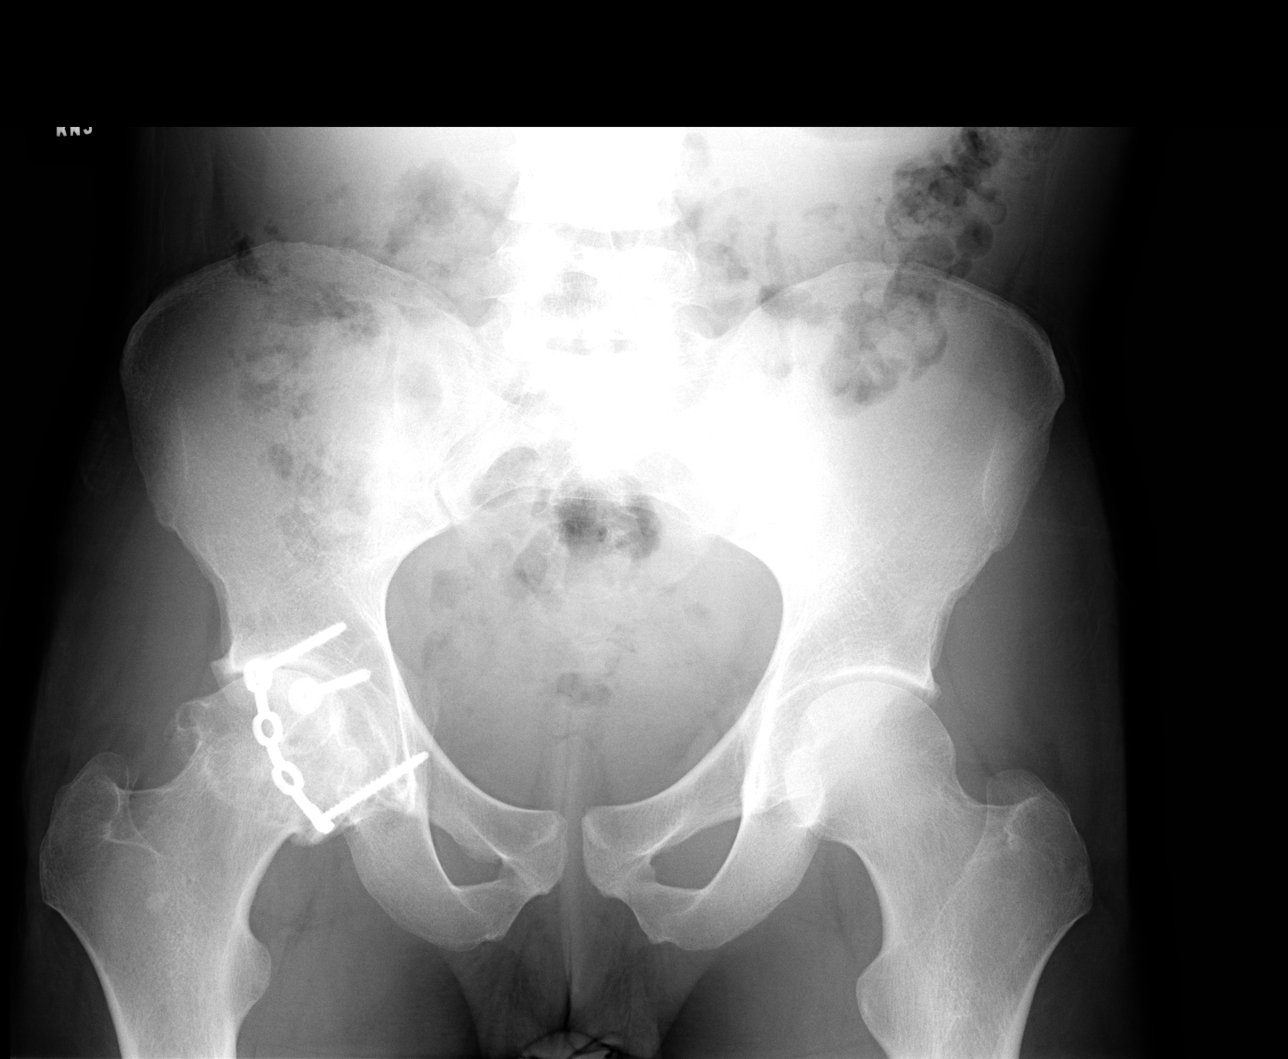

[view not recorded (2 of 3)]
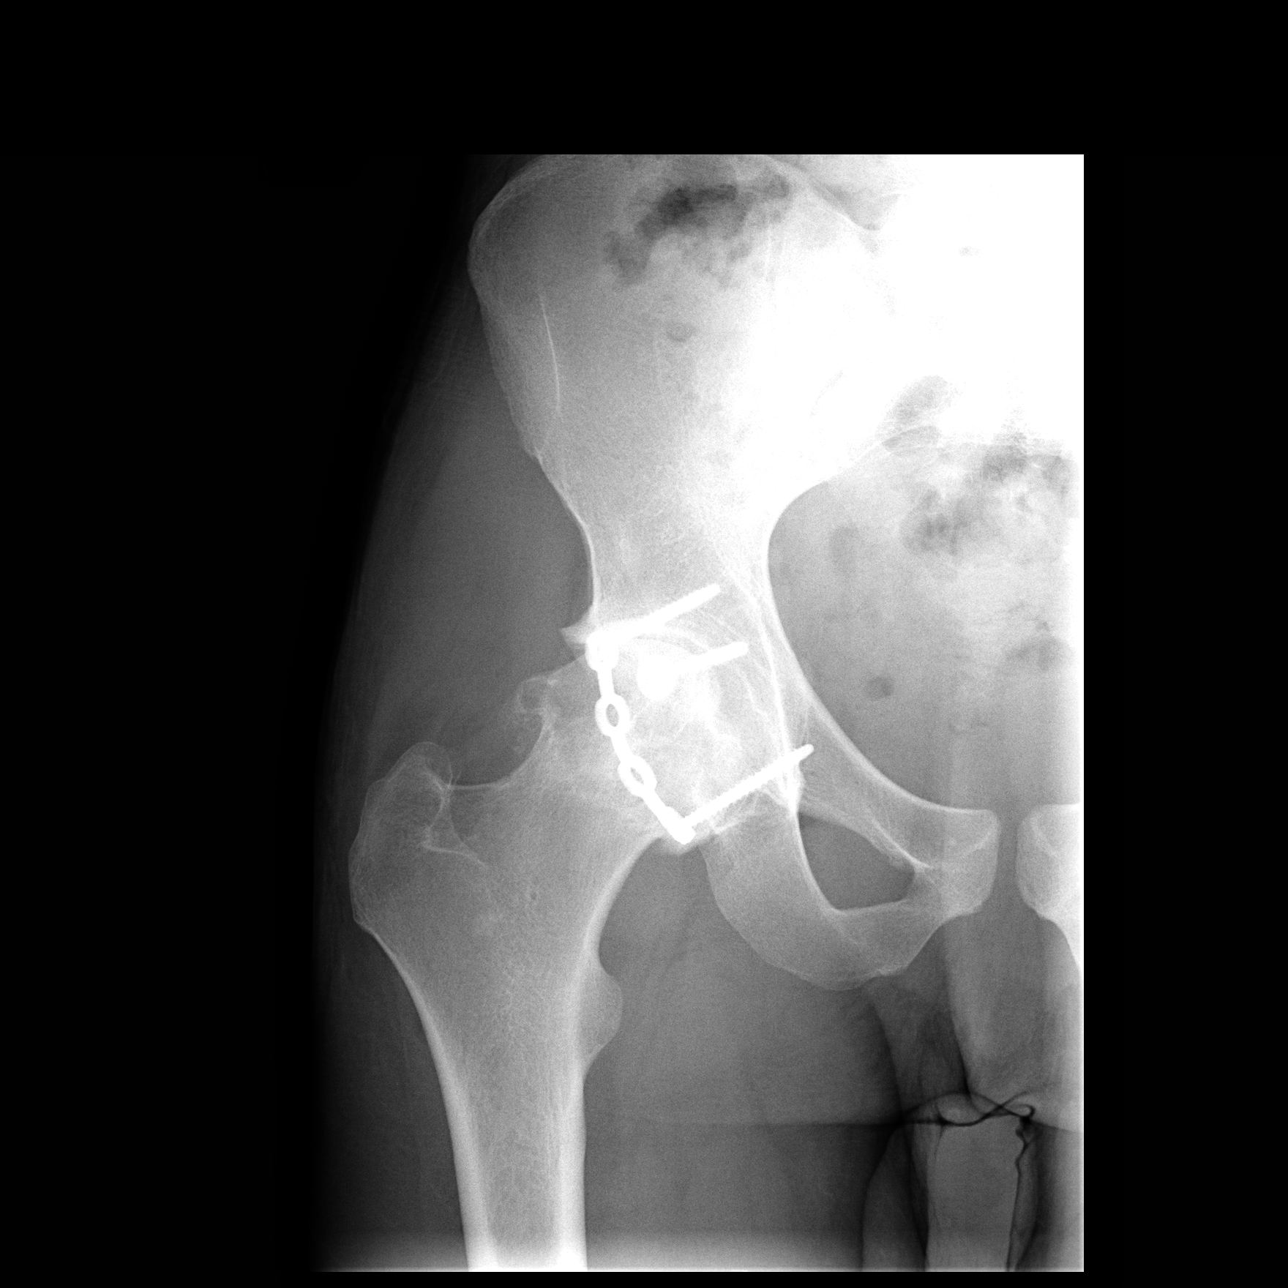

[view not recorded (3 of 3)]
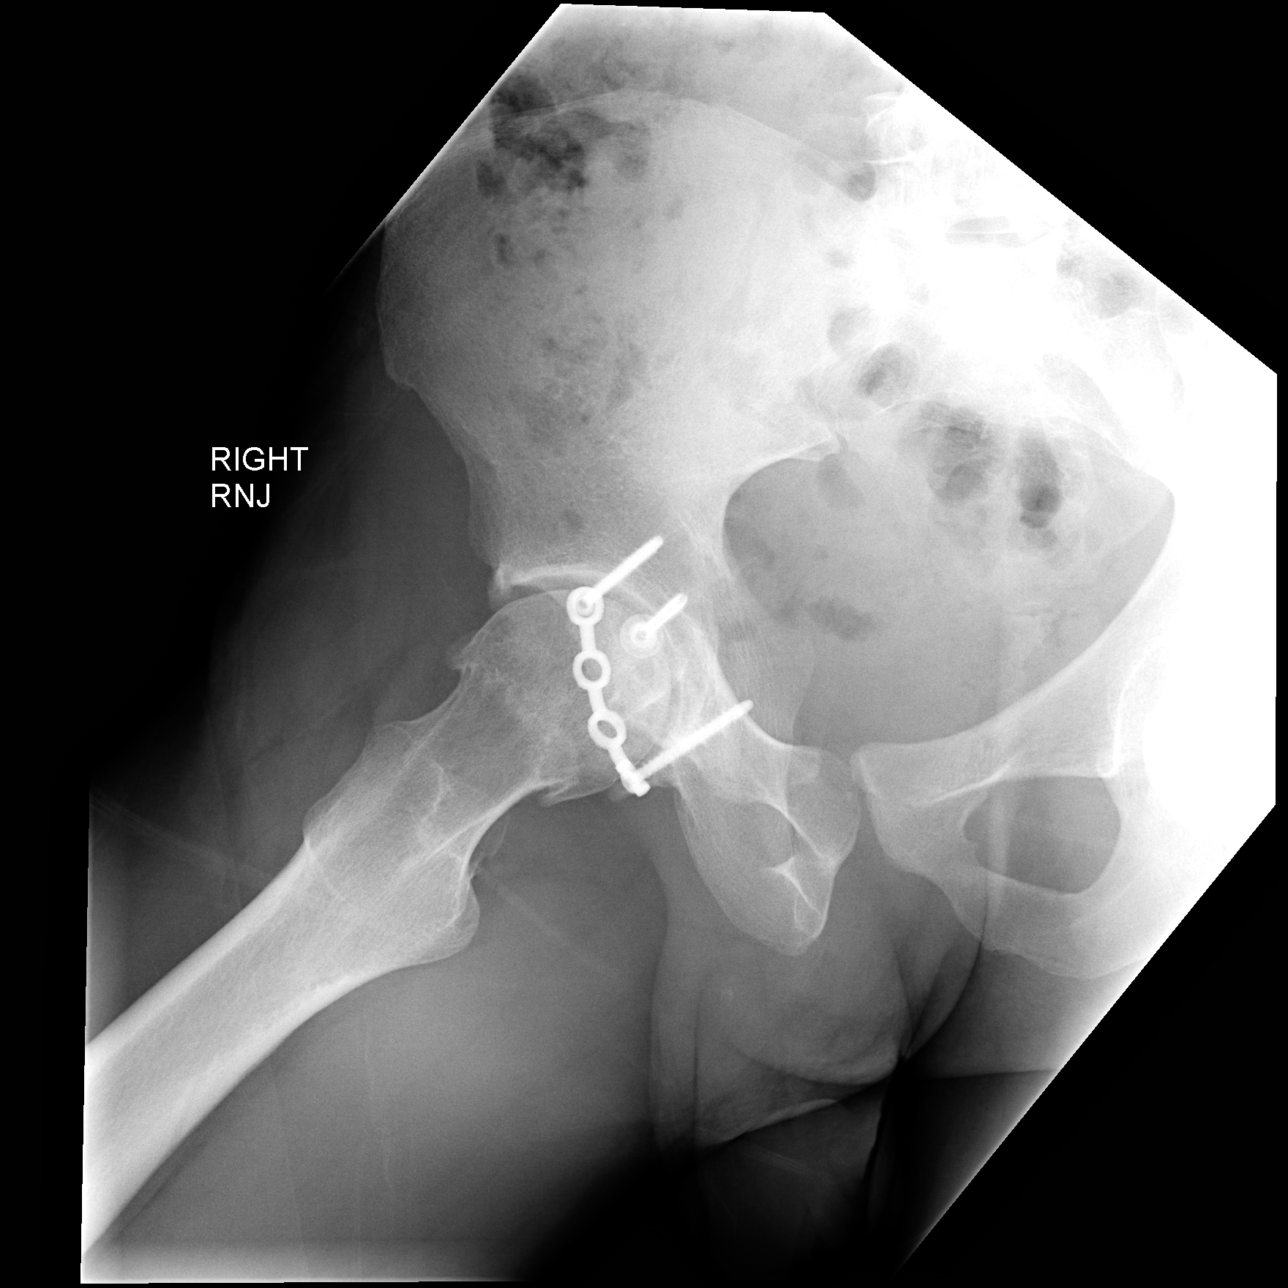

[3 of 3 positions shown; findings below may reference images not displayed]

IMPRESSION: Degenerative and postoperative changes right hip.
No acute abnormalities.

## 2006-12-31 IMAGING — CR DG CHEST 2V
2 series · 2 of 2 positions shown · non-contrast
Comparison: 07/21/2003.

CLINICAL DATA: Sickle cell crisis with mid chest pain and shortness of breath.

CHEST - 2 VIEW

[view not recorded (1 of 2)]
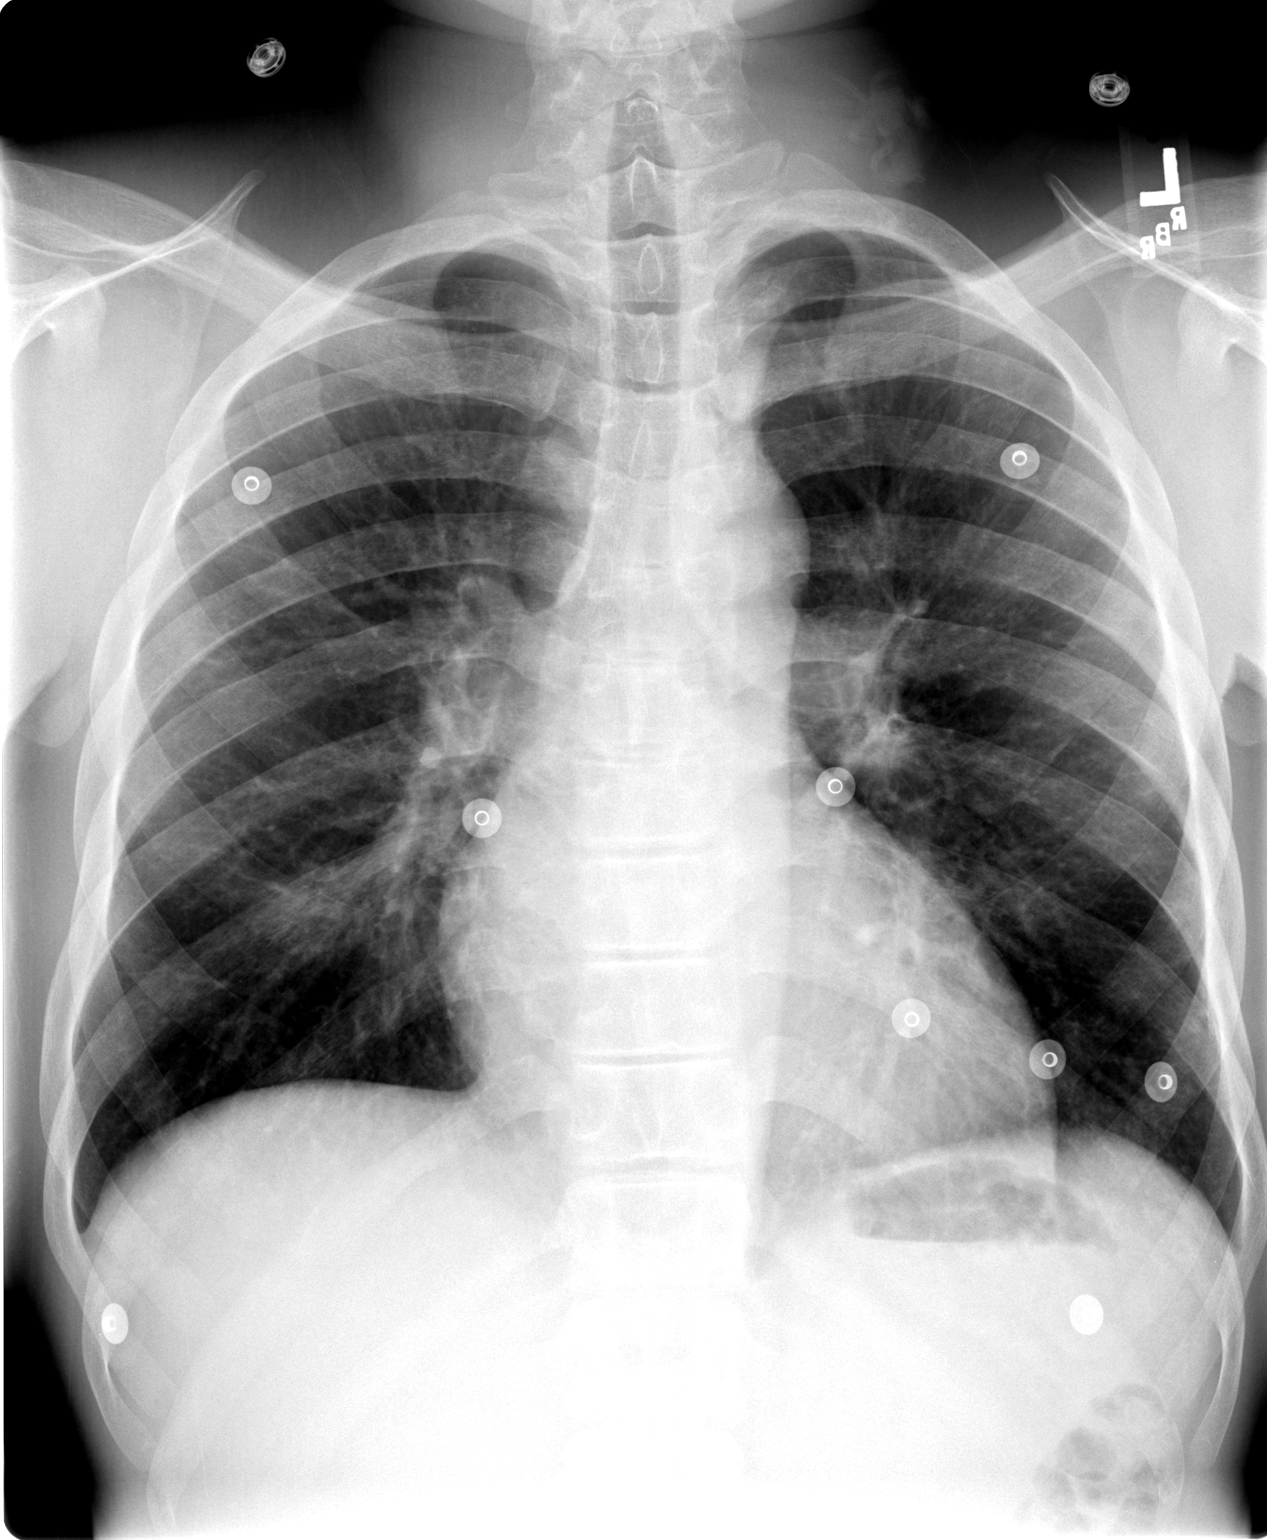

[view not recorded (2 of 2)]
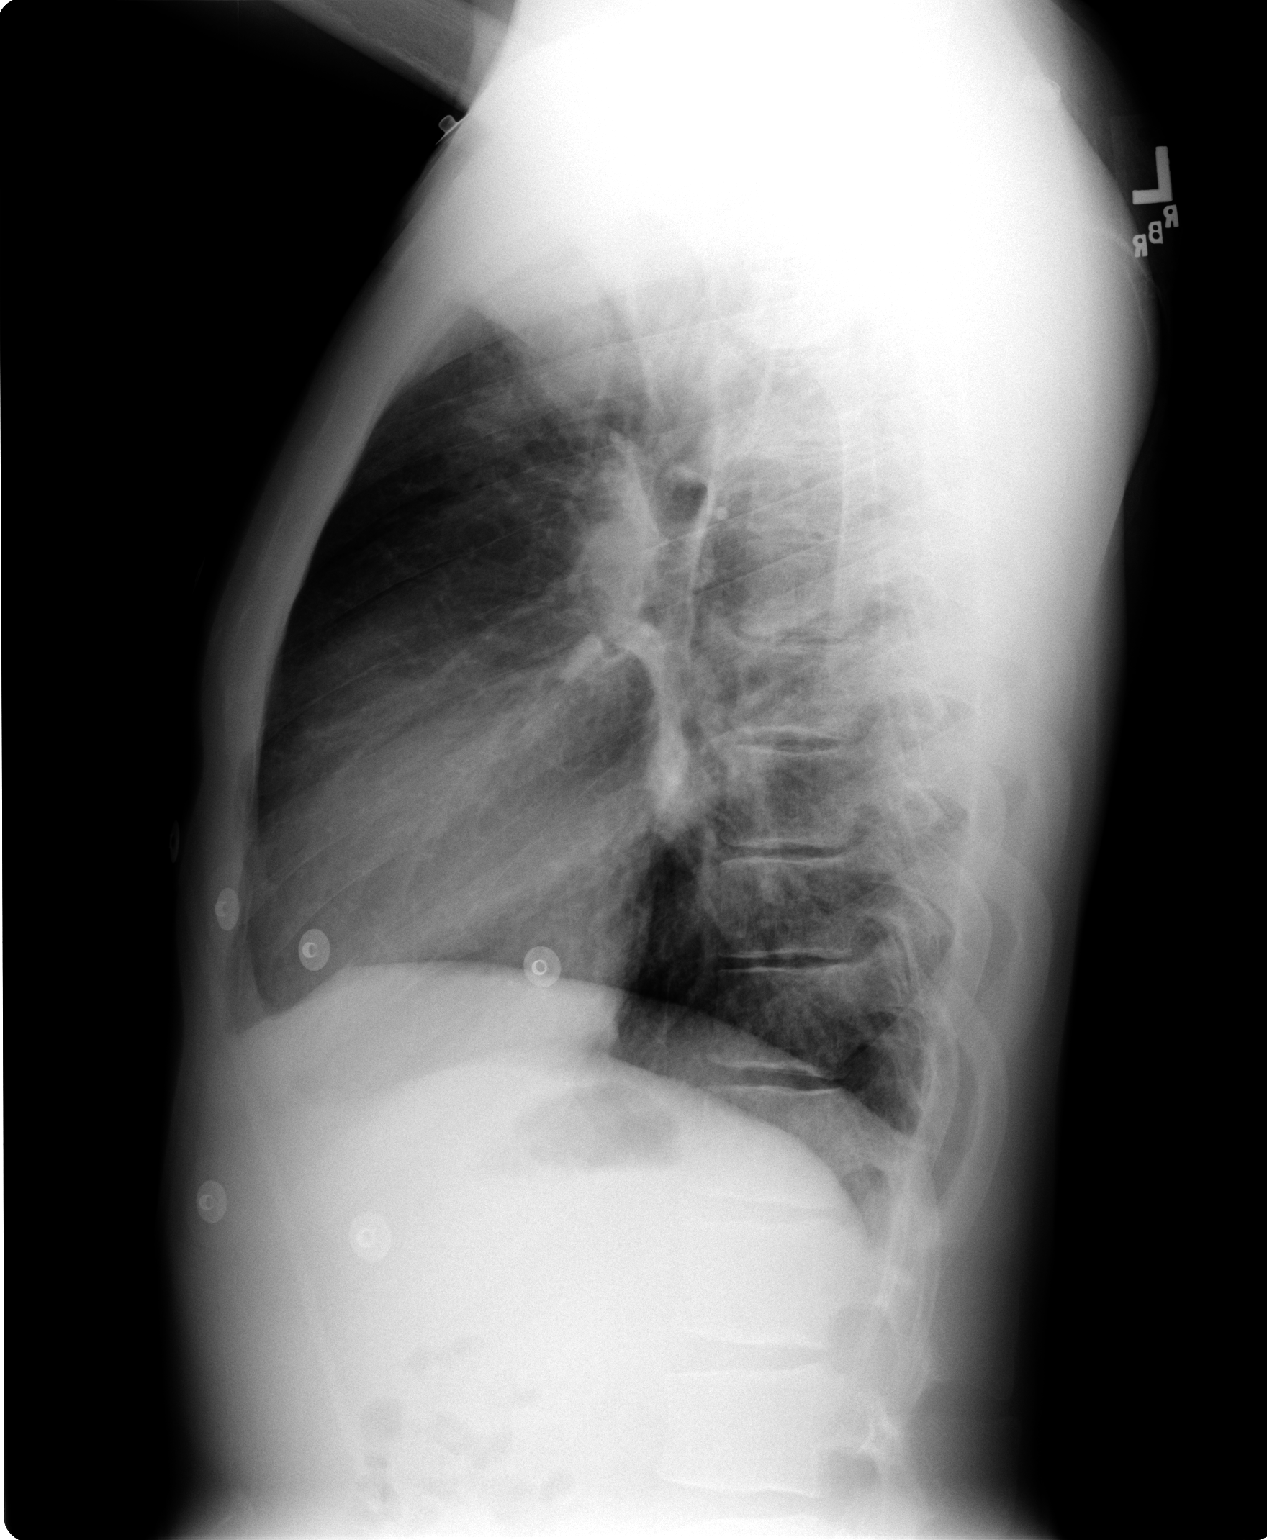

[2 of 2 positions shown; findings below may reference images not displayed]

FINDINGS: Borderline enlarged cardiac silhouette with improvement. Stable mild
diffuse peribronchial thickening. Unremarkable bones.

IMPRESSION

1. Borderline cardiomegaly with improvement.

2. Stable mild chronic bronchitic changes.

## 2007-01-14 ENCOUNTER — Emergency Department (HOSPITAL_COMMUNITY): Admission: EM | Admit: 2007-01-14 | Discharge: 2007-01-15 | Payer: Self-pay | Admitting: Emergency Medicine
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
## 2007-01-26 ENCOUNTER — Emergency Department (HOSPITAL_COMMUNITY): Admission: EM | Admit: 2007-01-26 | Discharge: 2007-01-26 | Payer: Self-pay | Admitting: Emergency Medicine

## 2007-02-03 ENCOUNTER — Emergency Department (HOSPITAL_COMMUNITY): Admission: EM | Admit: 2007-02-03 | Discharge: 2007-02-03 | Payer: Self-pay | Admitting: Emergency Medicine

## 2007-02-06 ENCOUNTER — Emergency Department (HOSPITAL_COMMUNITY): Admission: EM | Admit: 2007-02-06 | Discharge: 2007-02-06 | Payer: Self-pay | Admitting: Emergency Medicine

## 2007-02-08 IMAGING — CR DG CHEST 2V
2 series · 2 of 2 positions shown · non-contrast
Comparison: 02/07/04.

CLINICAL DATA: 23-year-old with left-sided chest pain.  Sickle cell disease. 
 CHEST ? TWO VIEWS:

[view not recorded (1 of 2)]
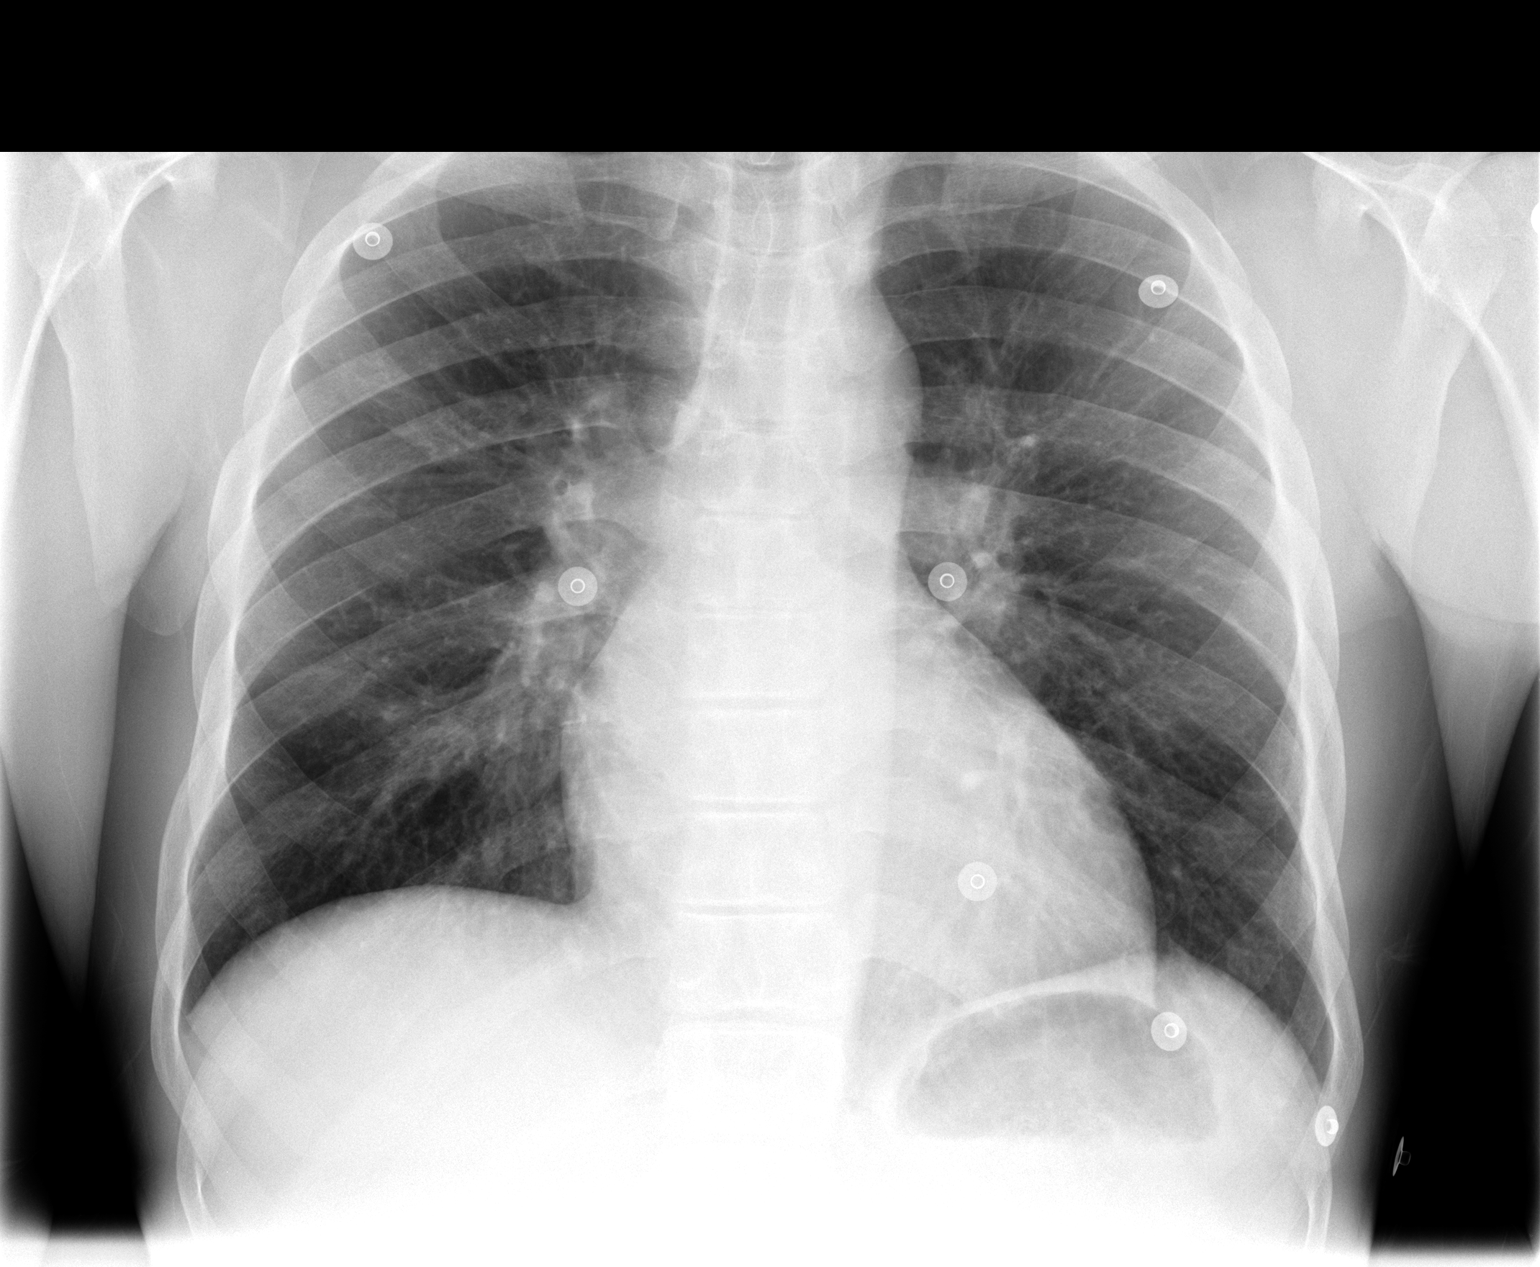

[view not recorded (2 of 2)]
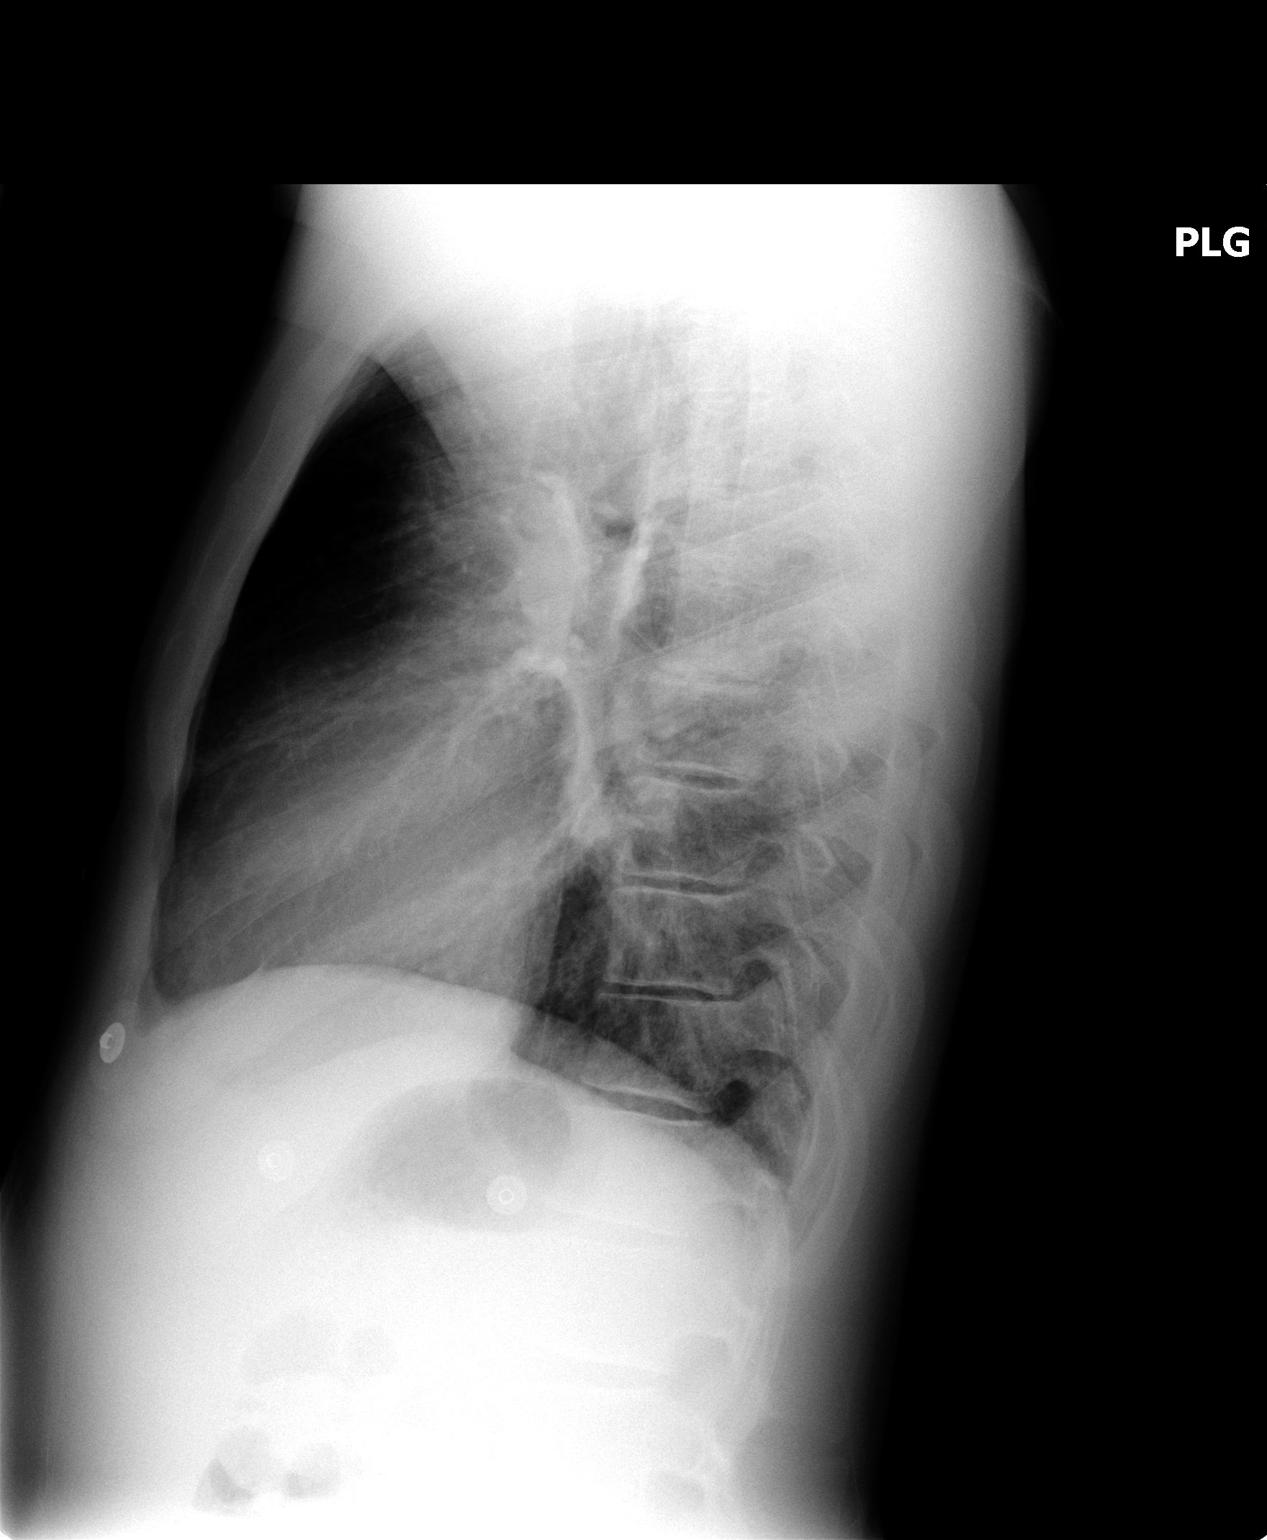

[2 of 2 positions shown; findings below may reference images not displayed]

There are mild bronchitic-type changes, which are stable.  No acute pulmonary findings.
IMPRESSION: Mild stable bronchitic changes.  No acute pulmonary infiltrates, edema or effusions.

## 2007-02-10 IMAGING — CR DG CHEST 1V PORT
1 series · 1 of 1 positions shown · non-contrast
Comparison: none

CLINICAL DATA: 23-year-old with chest pain. 
 PORTABLE CHEST:
 Single portable view of the chest compared to a two view chest from 03/17/04.  Cardiac silhouette, mediastinal and hilar contours are within normal limits.

[view not recorded]
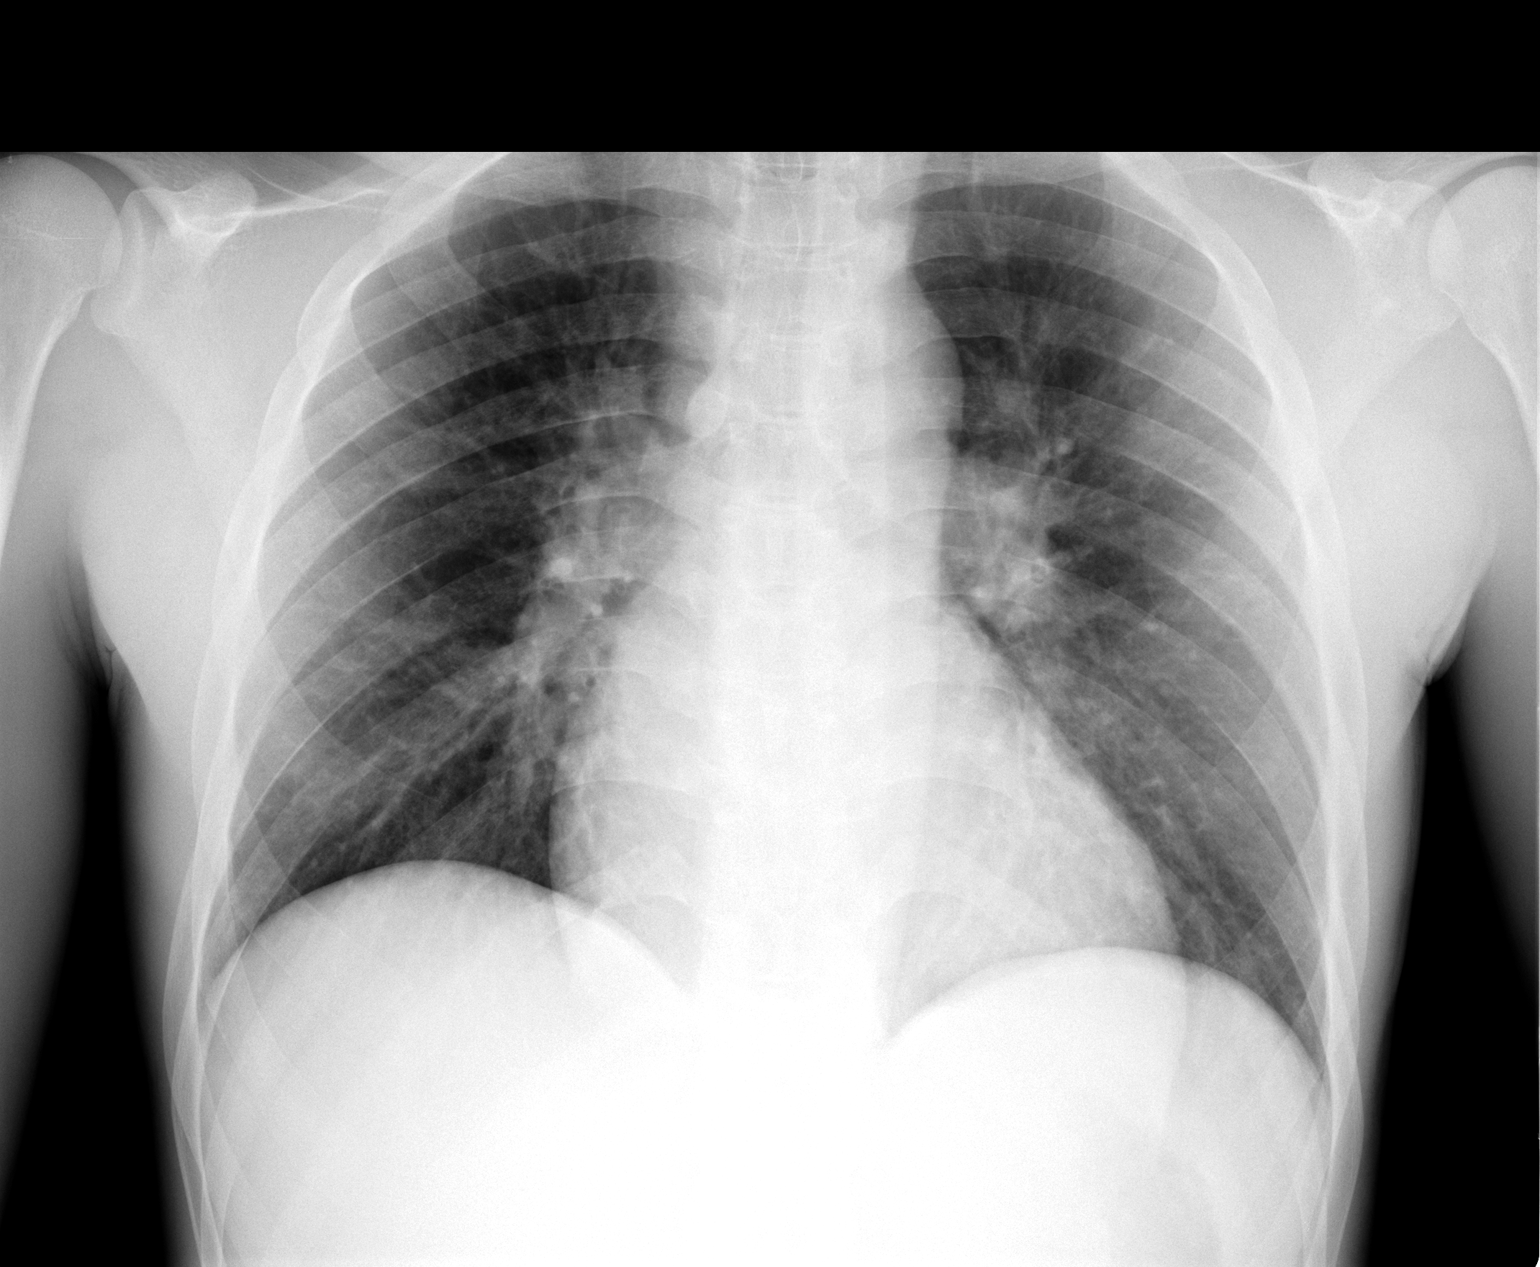

[1 of 1 positions shown; findings below may reference images not displayed]

IMPRESSION: No acute pulmonary findings.

## 2007-02-16 IMAGING — CR DG CHEST 2V
2 series · 2 of 2 positions shown · non-contrast
Comparison: 03/19/04.
 Heart size is upper normal.  There is no heart failure.  The lungs are clear.

CLINICAL DATA: Chest pain, cough.
 CHEST - 2 VIEW:

[view not recorded (1 of 2)]
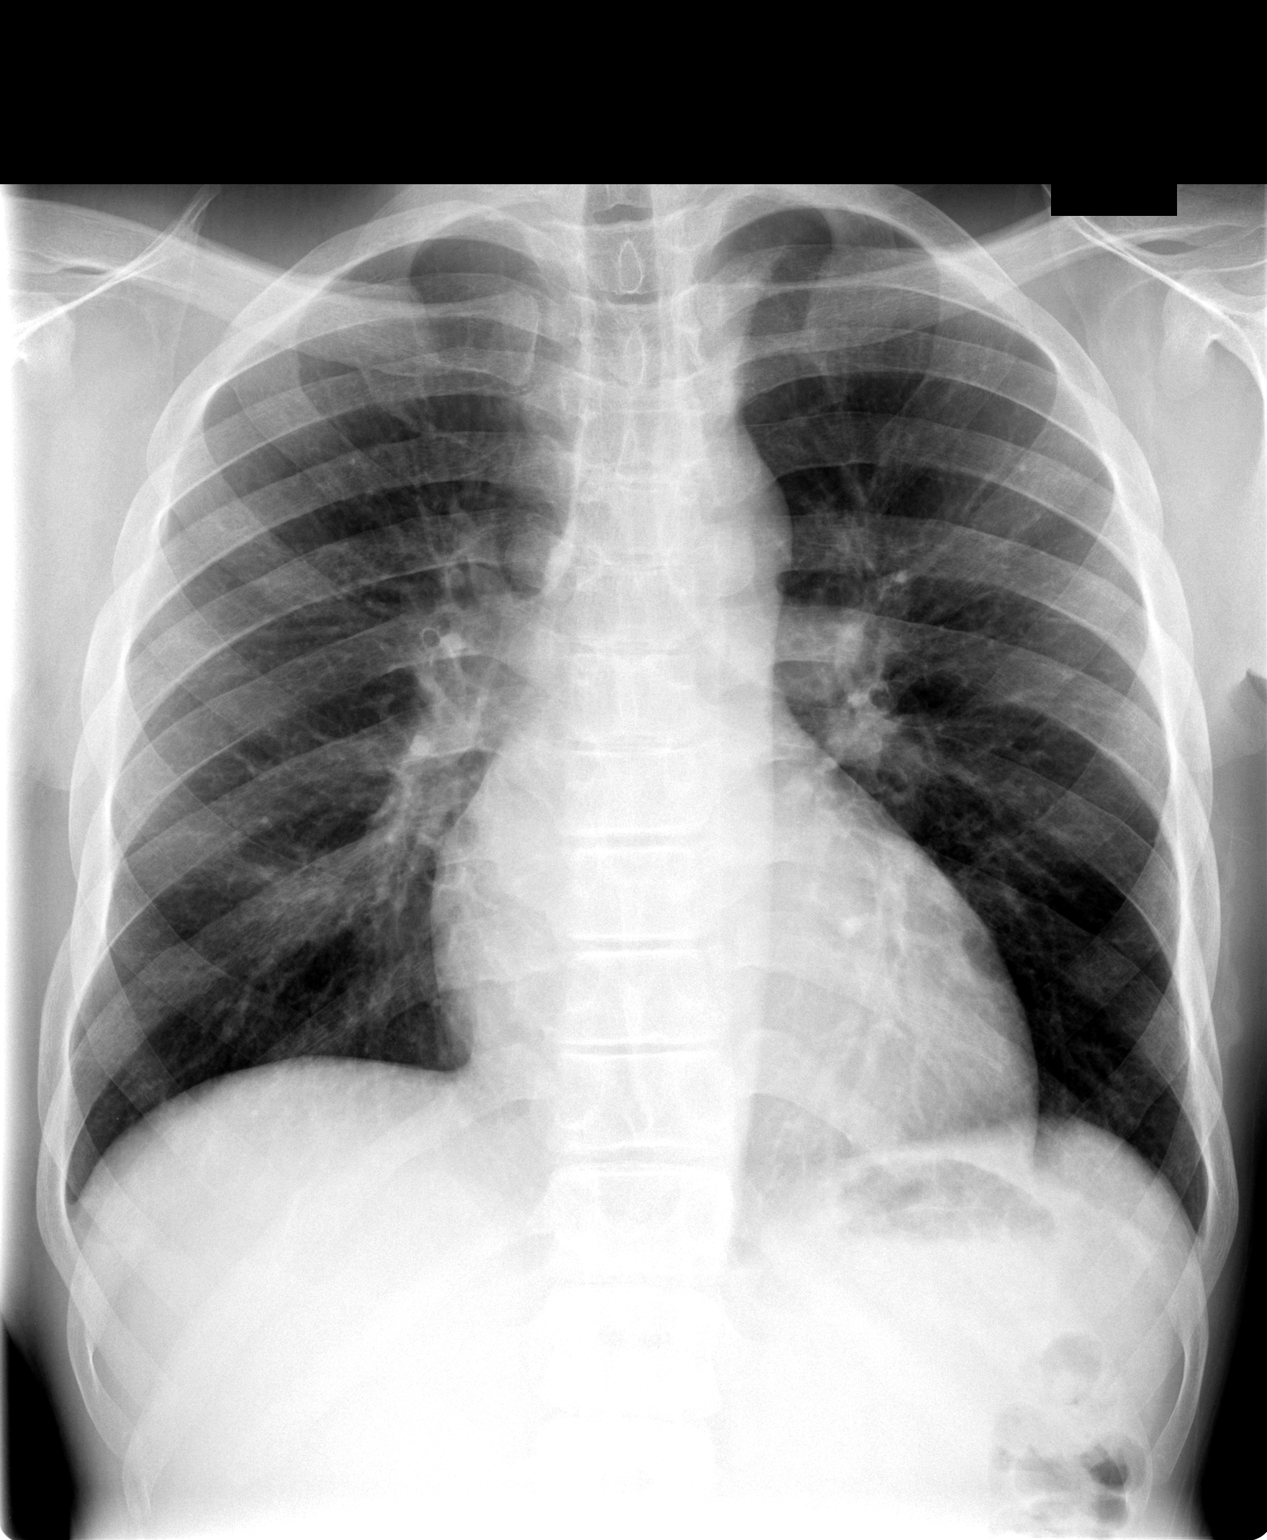

[view not recorded (2 of 2)]
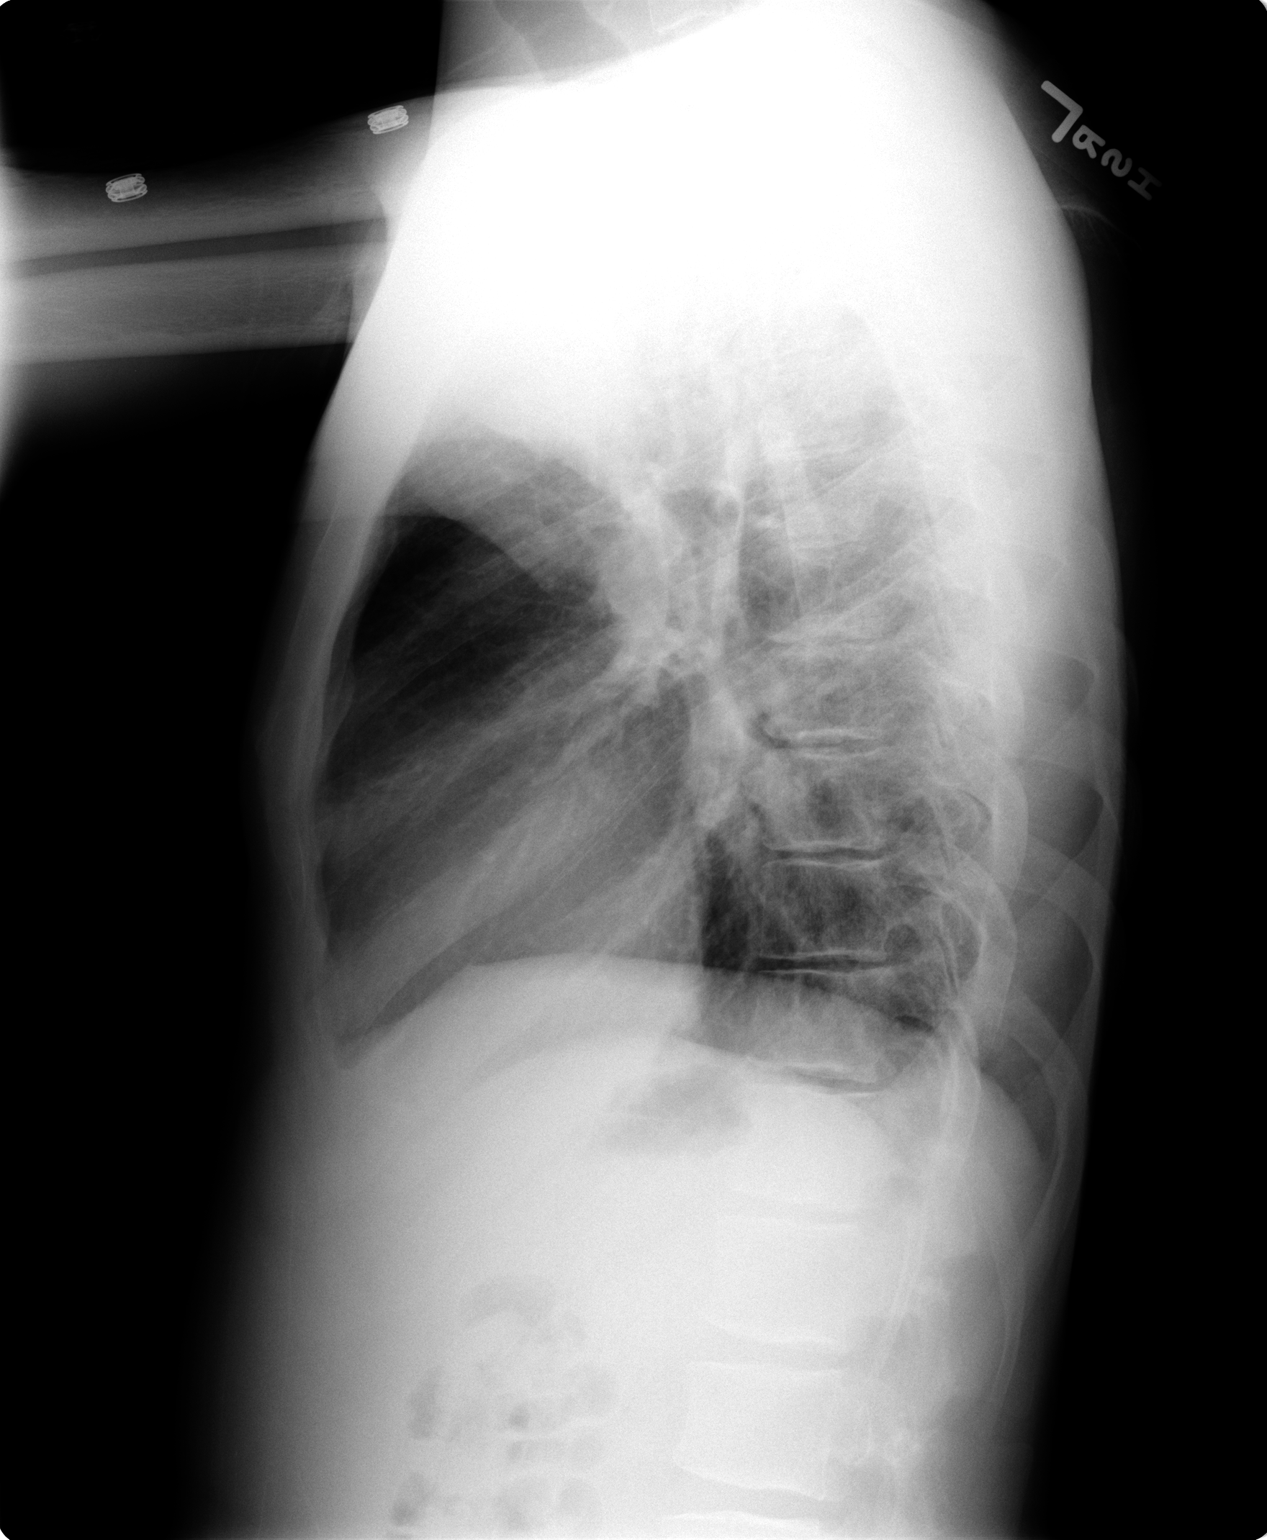

[2 of 2 positions shown; findings below may reference images not displayed]

IMPRESSION: No active disease.

## 2007-02-18 IMAGING — CR DG CHEST 1V PORT
1 series · 1 of 1 positions shown · non-contrast
Comparison: 03/25/04.

CLINICAL DATA: Sickle cell disease.  Bronchitis.  Chest pain.  
 PORTABLE CHEST ONE VIEW:

[view not recorded]
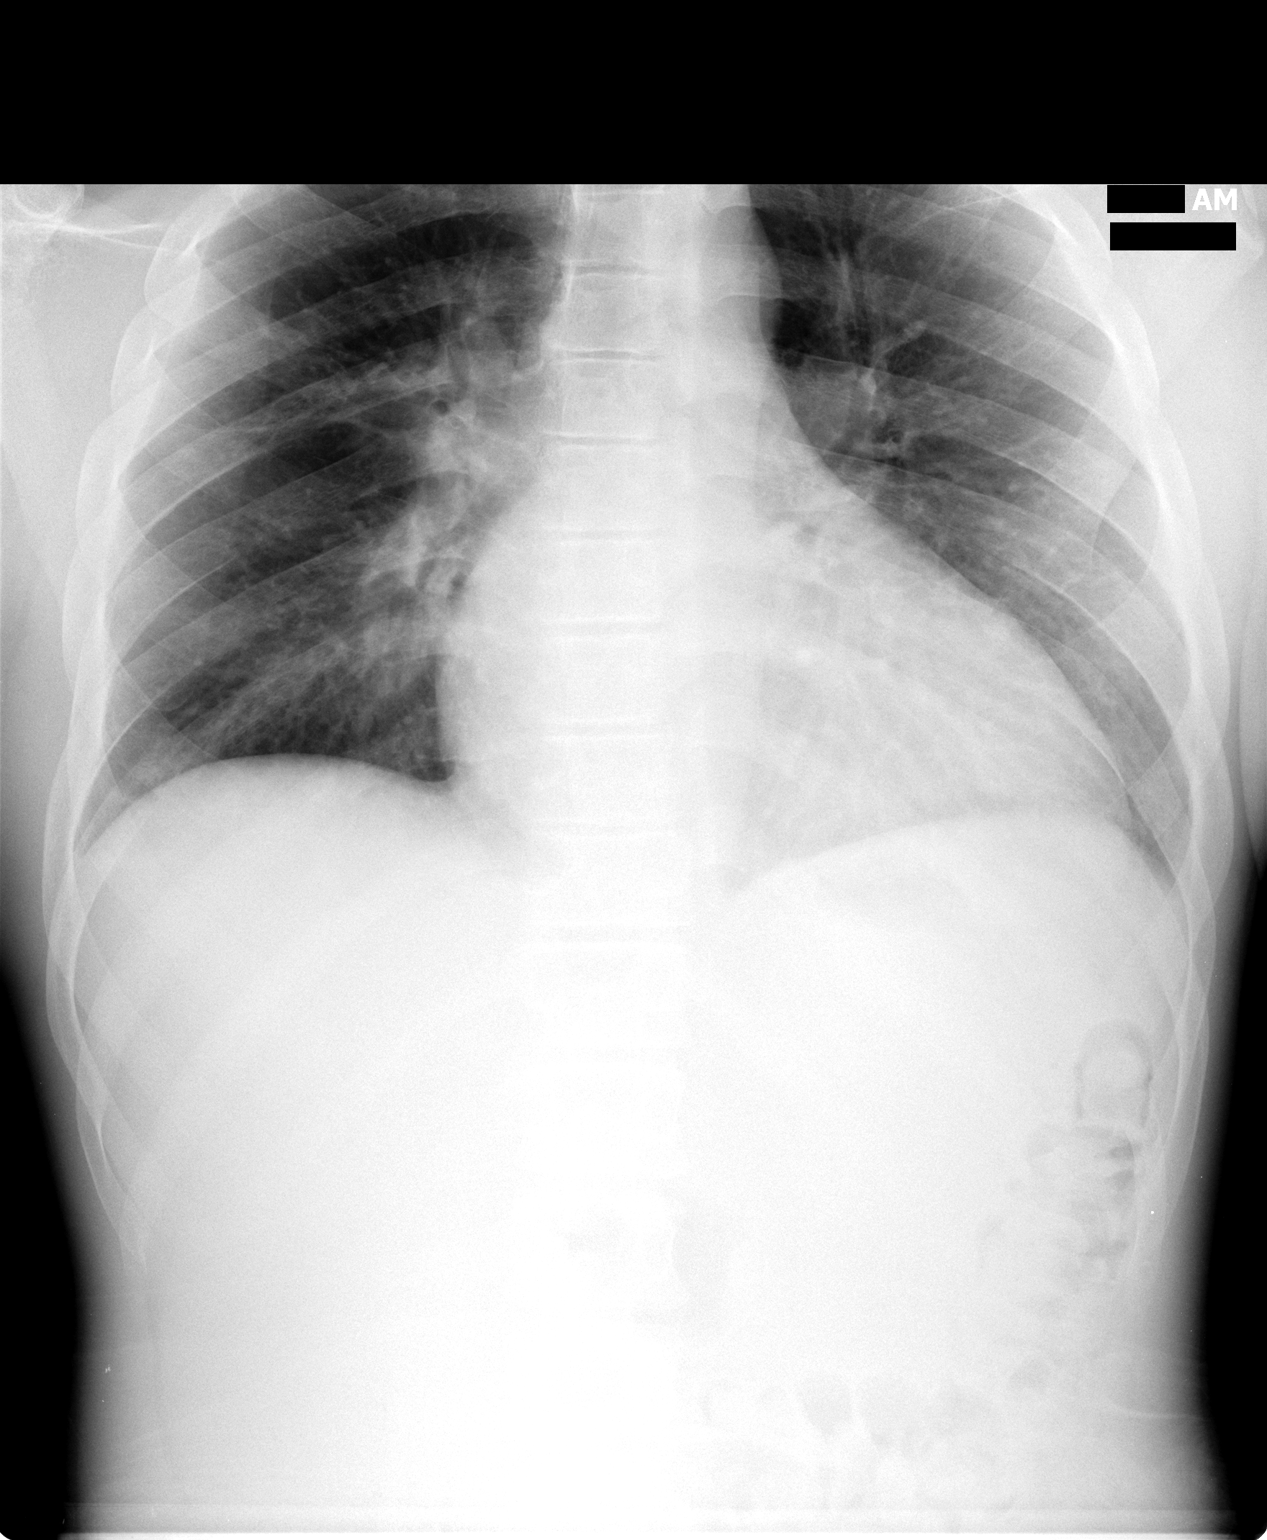

[1 of 1 positions shown; findings below may reference images not displayed]

FINDINGS: Bilateral peribronchial cuffing, which can be seen with lower respiratory tract viral infections or reactive airways disease.  No focal lung opacities are identified to suggest pneumonia at this point.  There are no pleural effusions.  Cardiomegaly.  Skeletal changes consistent with Sickle cell disease.
IMPRESSION: Peribronchial cuffing consistent with reactive airways disease or lower respiratory tract viral infection.

## 2007-03-08 ENCOUNTER — Inpatient Hospital Stay (HOSPITAL_COMMUNITY): Admission: EM | Admit: 2007-03-08 | Discharge: 2007-03-09 | Payer: Self-pay | Admitting: Emergency Medicine

## 2007-03-19 ENCOUNTER — Inpatient Hospital Stay (HOSPITAL_COMMUNITY): Admission: EM | Admit: 2007-03-19 | Discharge: 2007-03-23 | Payer: Self-pay | Admitting: Emergency Medicine

## 2007-03-22 IMAGING — CR DG CHEST 2V
2 series · 2 of 2 positions shown · non-contrast
Comparison: 03/27/04.

CLINICAL DATA: Shortness of breath.
 CHEST ? 2 VIEW:

[view not recorded (1 of 2)]
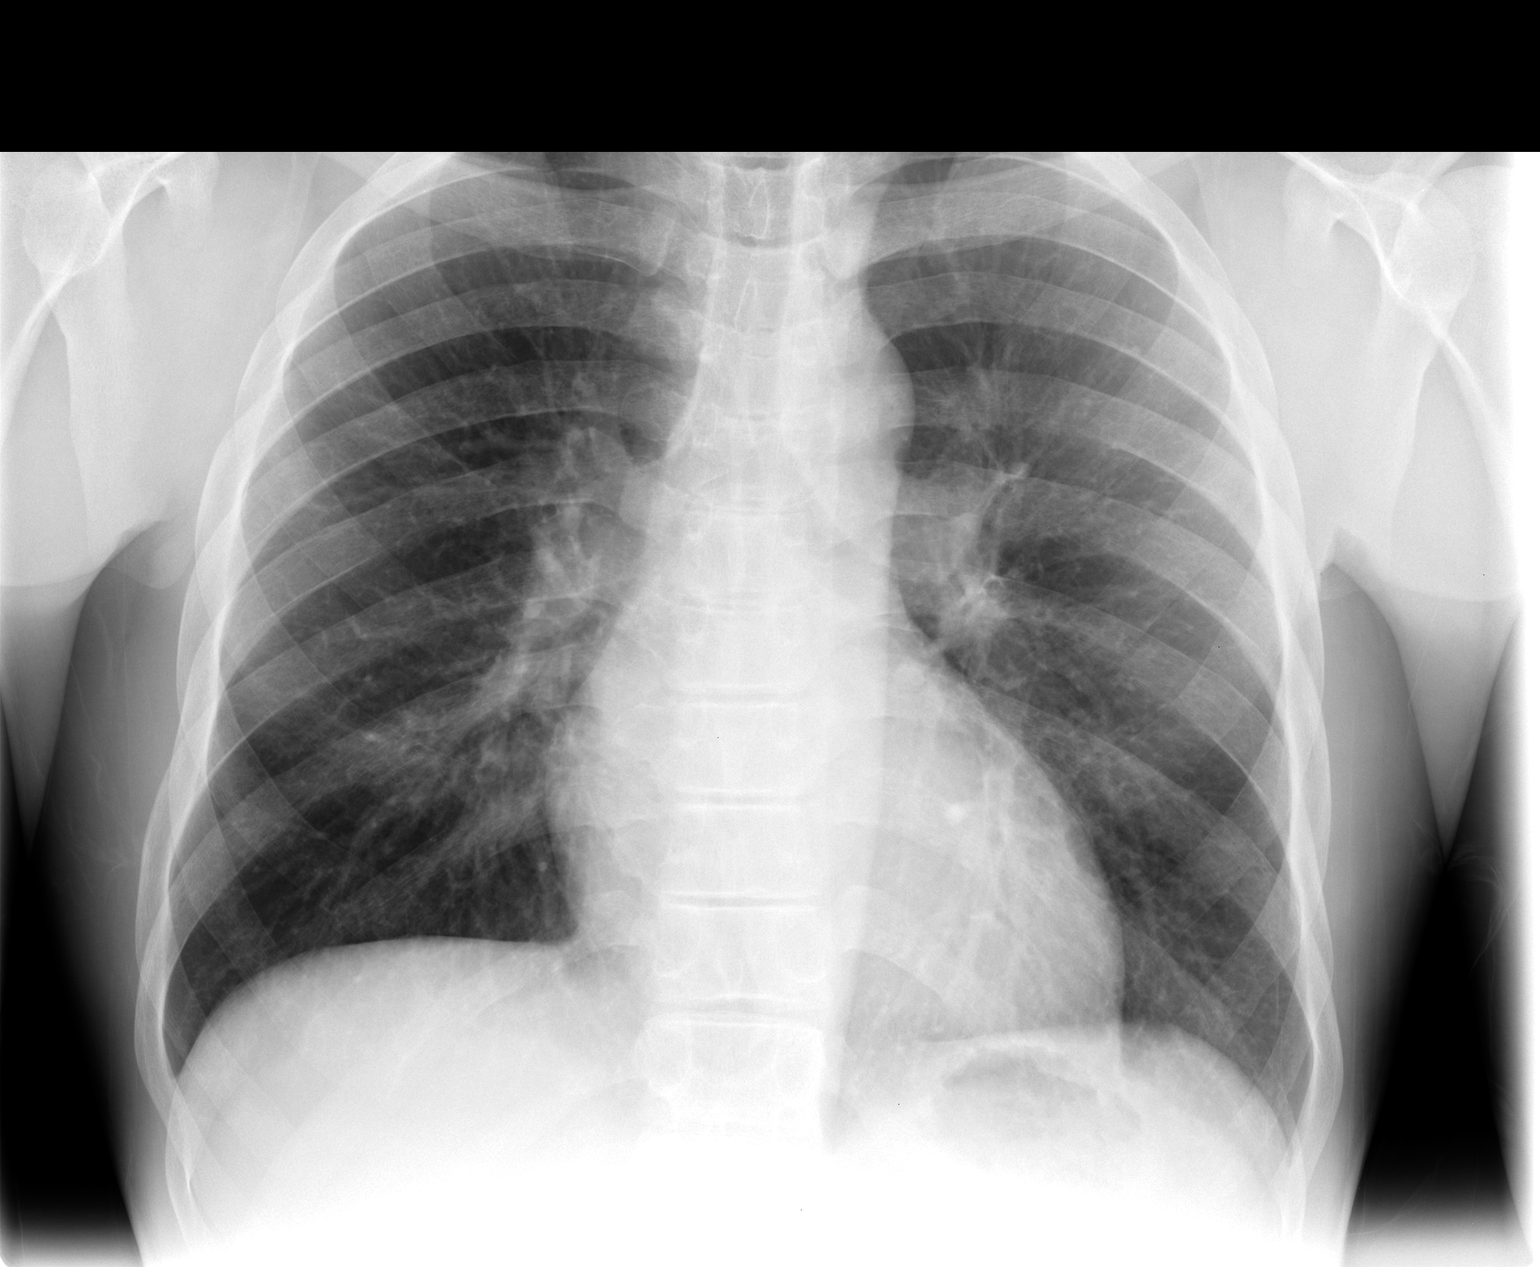

[view not recorded (2 of 2)]
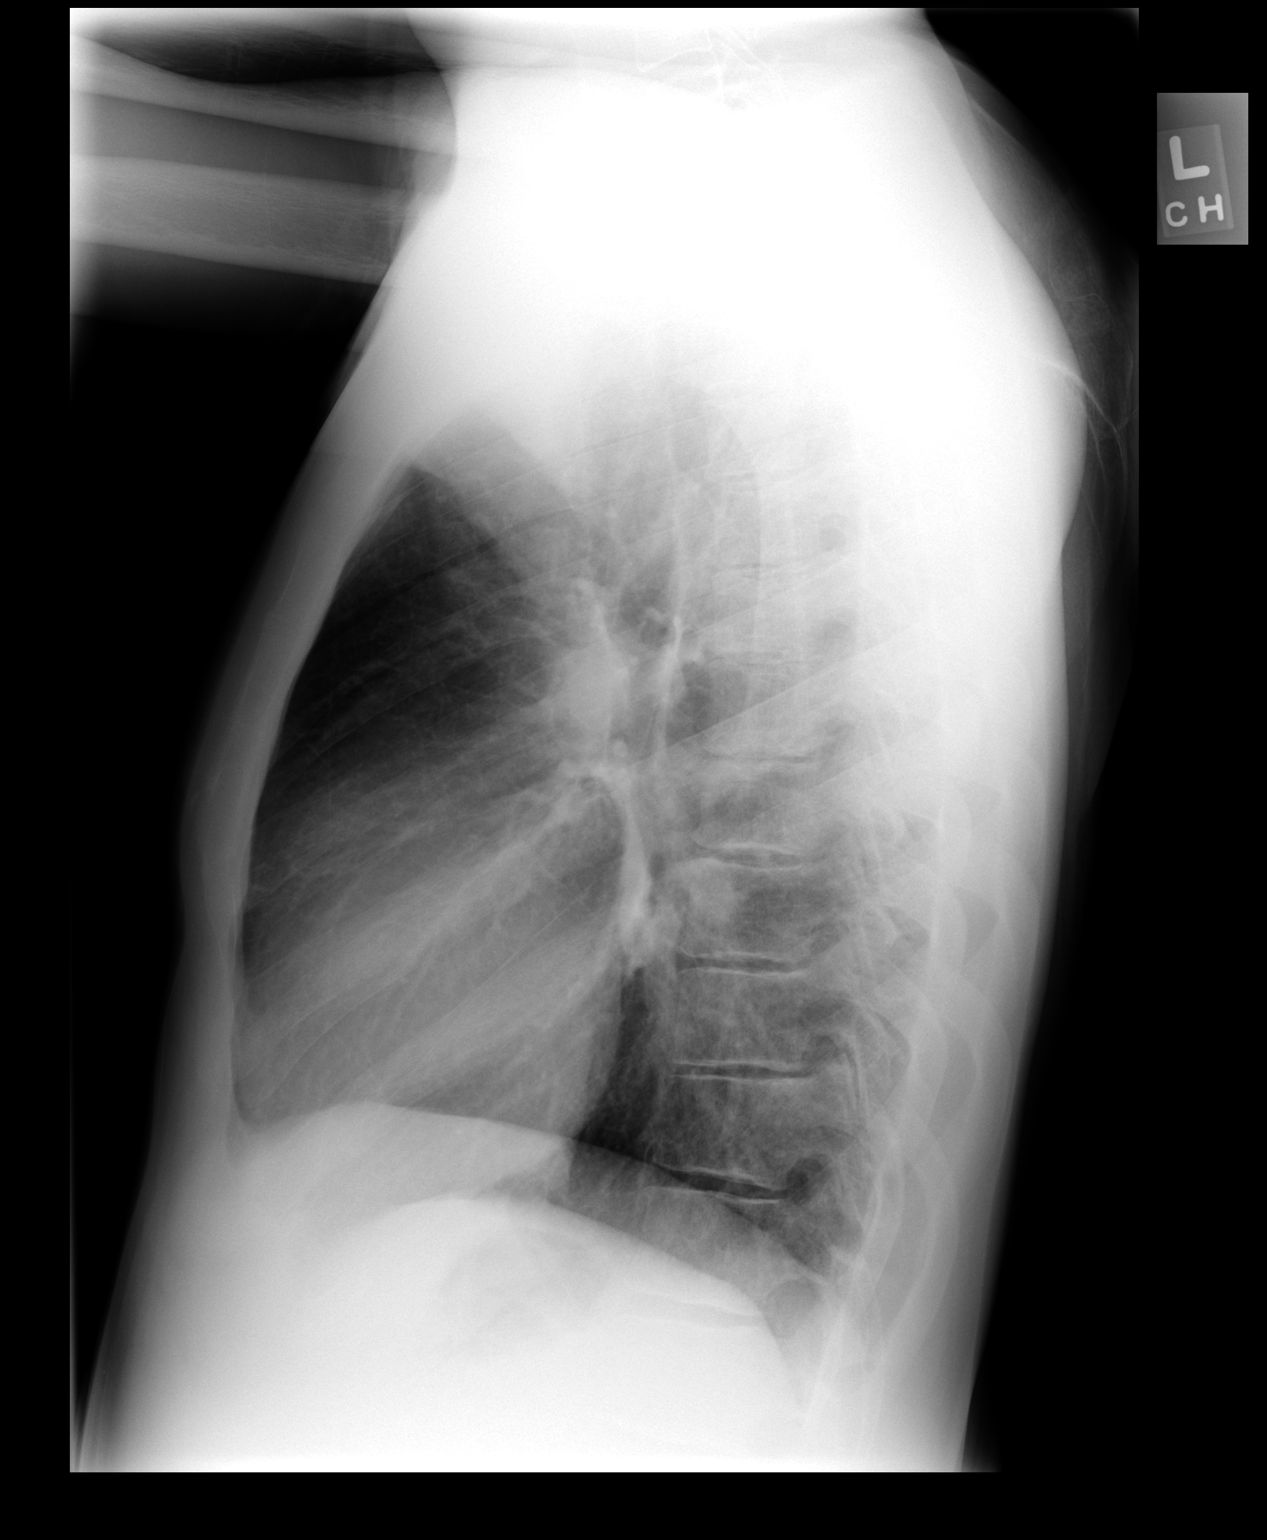

[2 of 2 positions shown; findings below may reference images not displayed]

The heart size and mediastinal contours are normal.  The lungs are clear.  The visualized skeleton is unremarkable.
IMPRESSION: No active disease.

## 2007-03-25 IMAGING — CR DG CHEST 2V
2 series · 2 of 2 positions shown · non-contrast
Comparison: 04/28/04 and 07/21/03.

CLINICAL DATA: Chest pain.  Sickle cell disease. 
 CHEST - 2 VIEWS:

[view not recorded (1 of 2)]
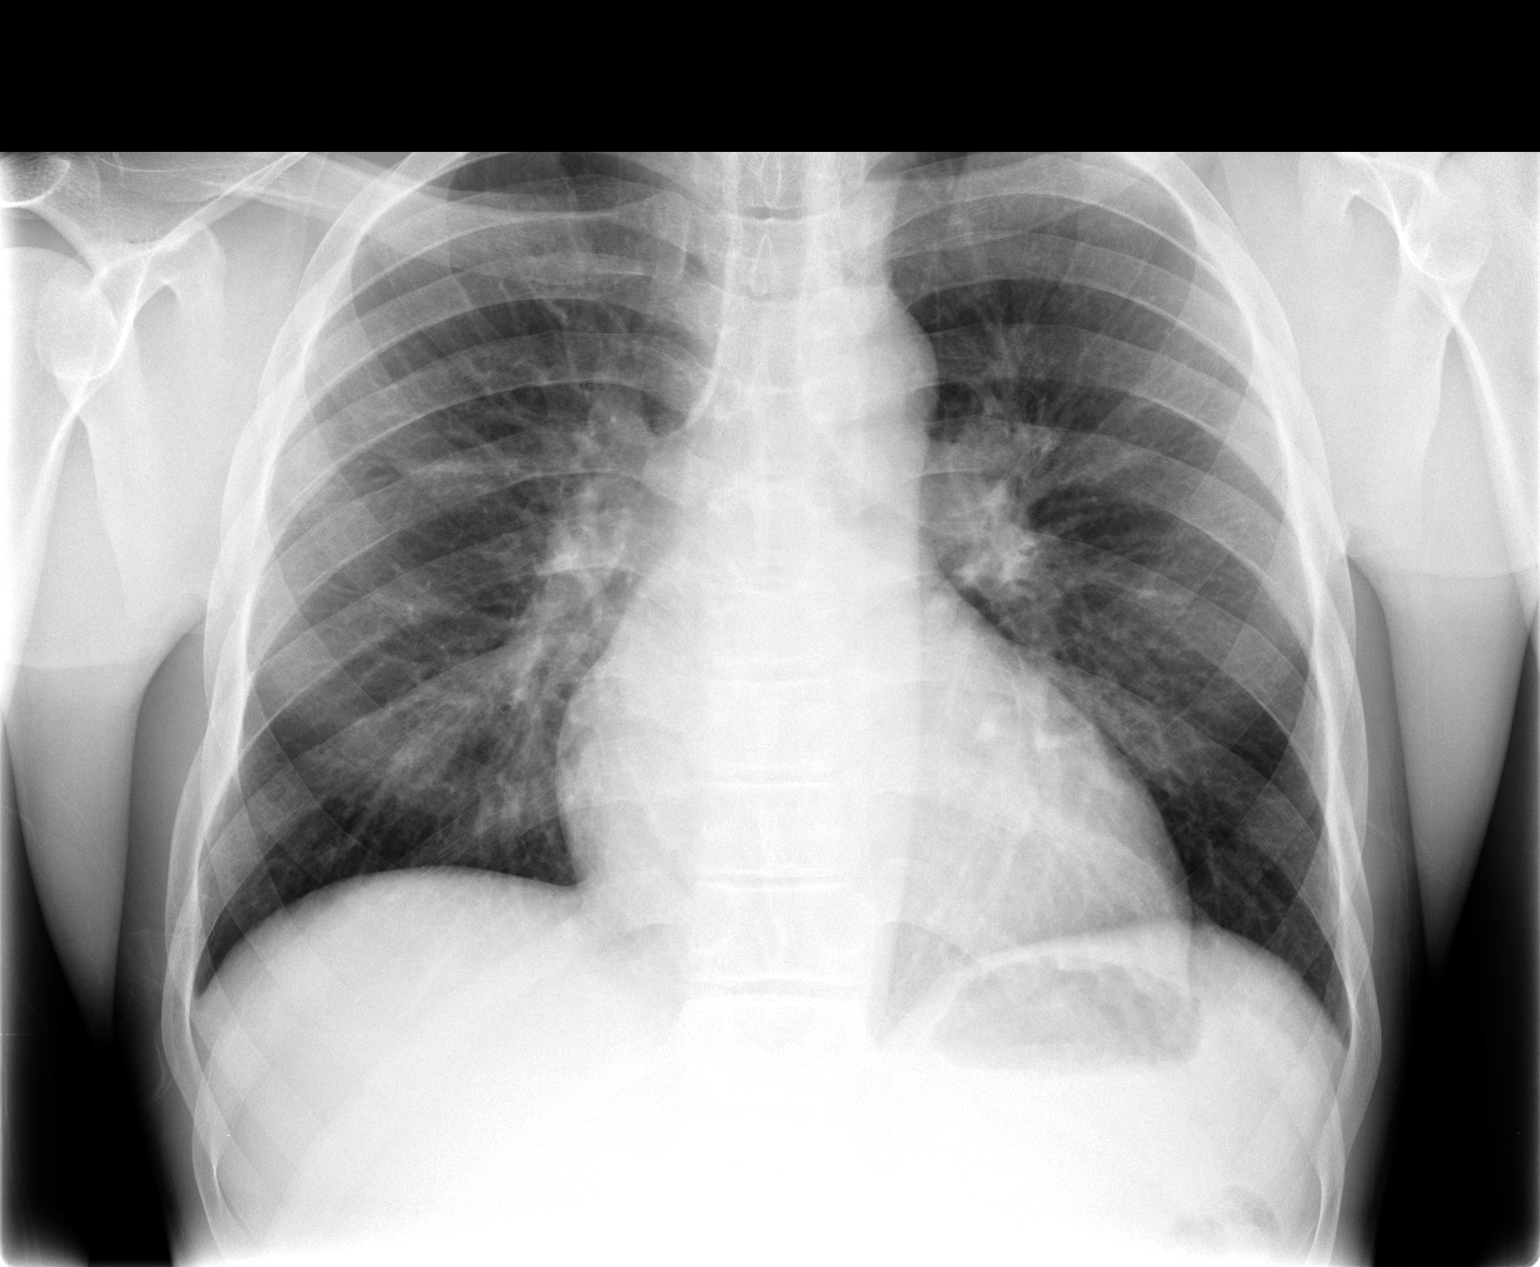

[view not recorded (2 of 2)]
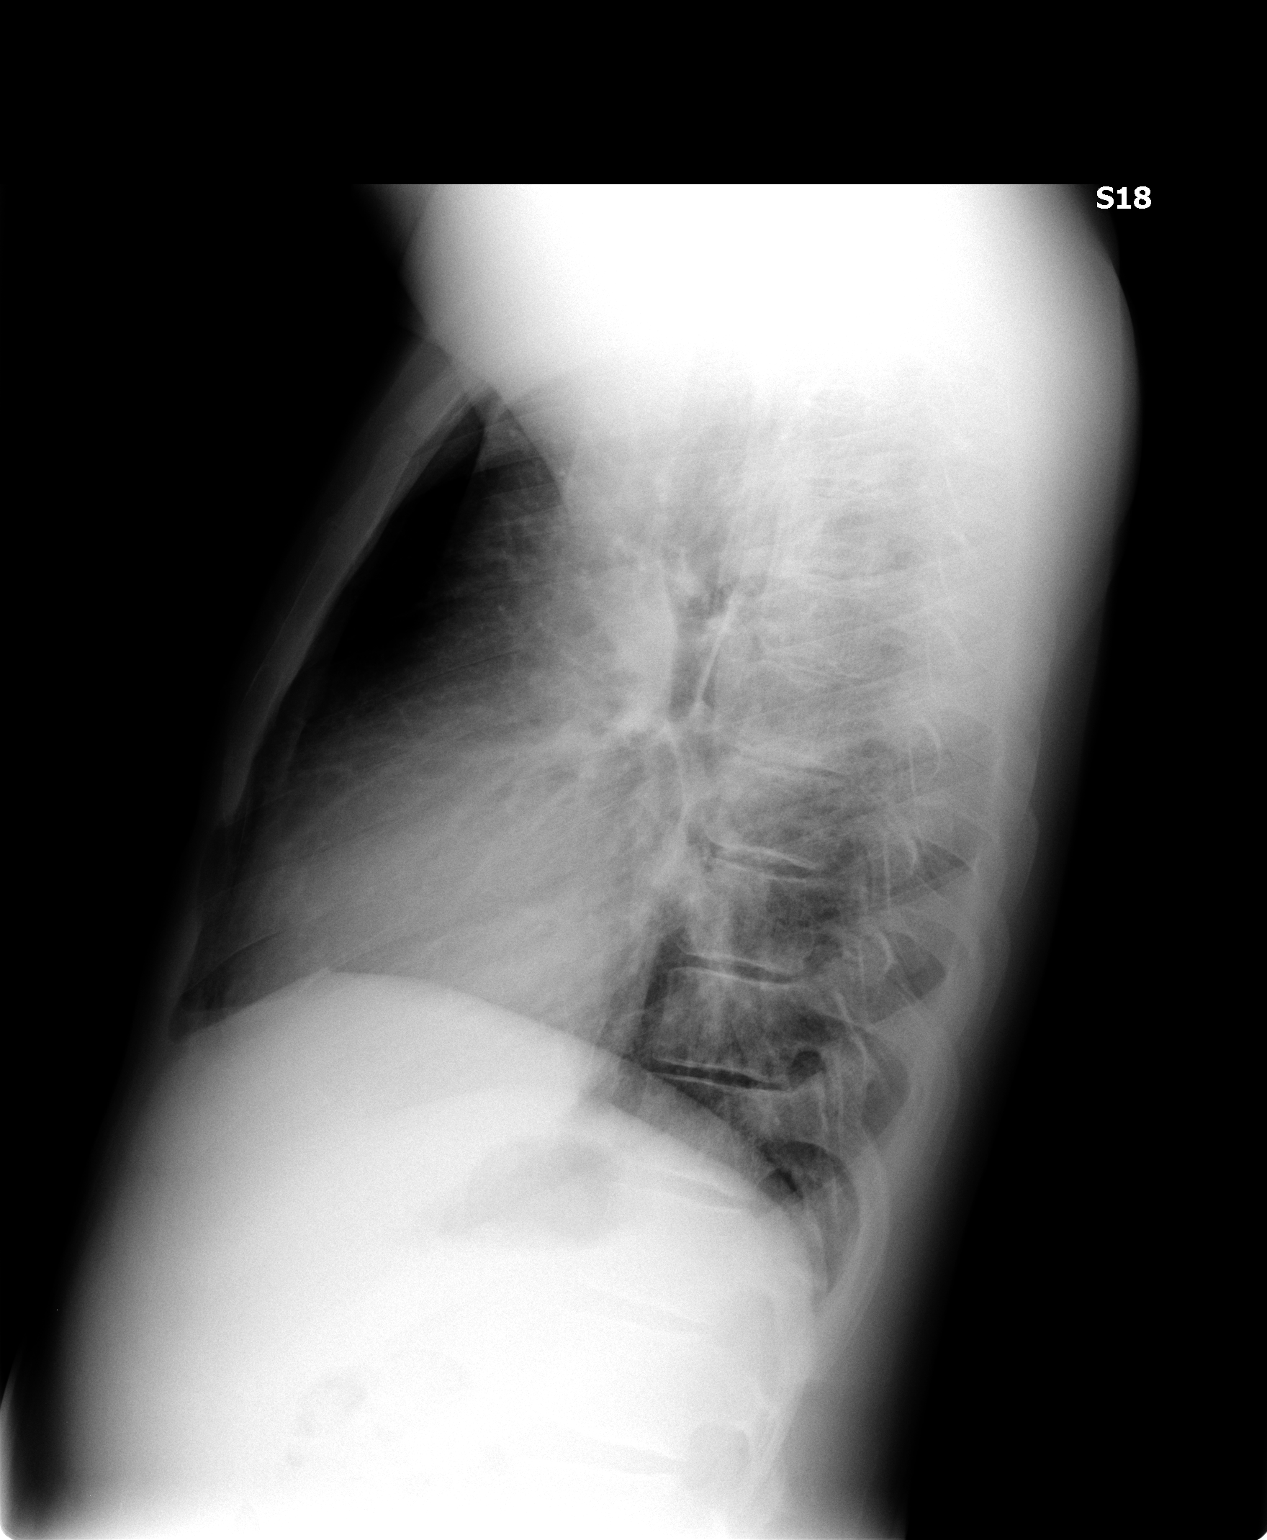

[2 of 2 positions shown; findings below may reference images not displayed]

FINDINGS: Cardiomegaly and mild peribronchial thickening are stable.  The lungs are otherwise clear.  No pleural effusions or pneumothorax.  The mediastinal silhouette is stable.  Visualized upper abdomen is unremarkable.
IMPRESSION: 1.  No evidence of acute cardiopulmonary disease.  
 2.  Stable cardiomegaly and mild peribronchial thickening.

## 2007-04-12 ENCOUNTER — Emergency Department (HOSPITAL_COMMUNITY): Admission: EM | Admit: 2007-04-12 | Discharge: 2007-04-12 | Payer: Self-pay | Admitting: Emergency Medicine

## 2007-04-16 ENCOUNTER — Emergency Department (HOSPITAL_COMMUNITY): Admission: EM | Admit: 2007-04-16 | Discharge: 2007-04-17 | Payer: Self-pay | Admitting: Emergency Medicine

## 2007-04-19 IMAGING — CR DG CHEST 2V
2 series · 2 of 2 positions shown · non-contrast
Comparison: 05/01/04.

CLINICAL DATA: Chest pain. 
 CHEST 2 VIEWS:

[w chest pa *]
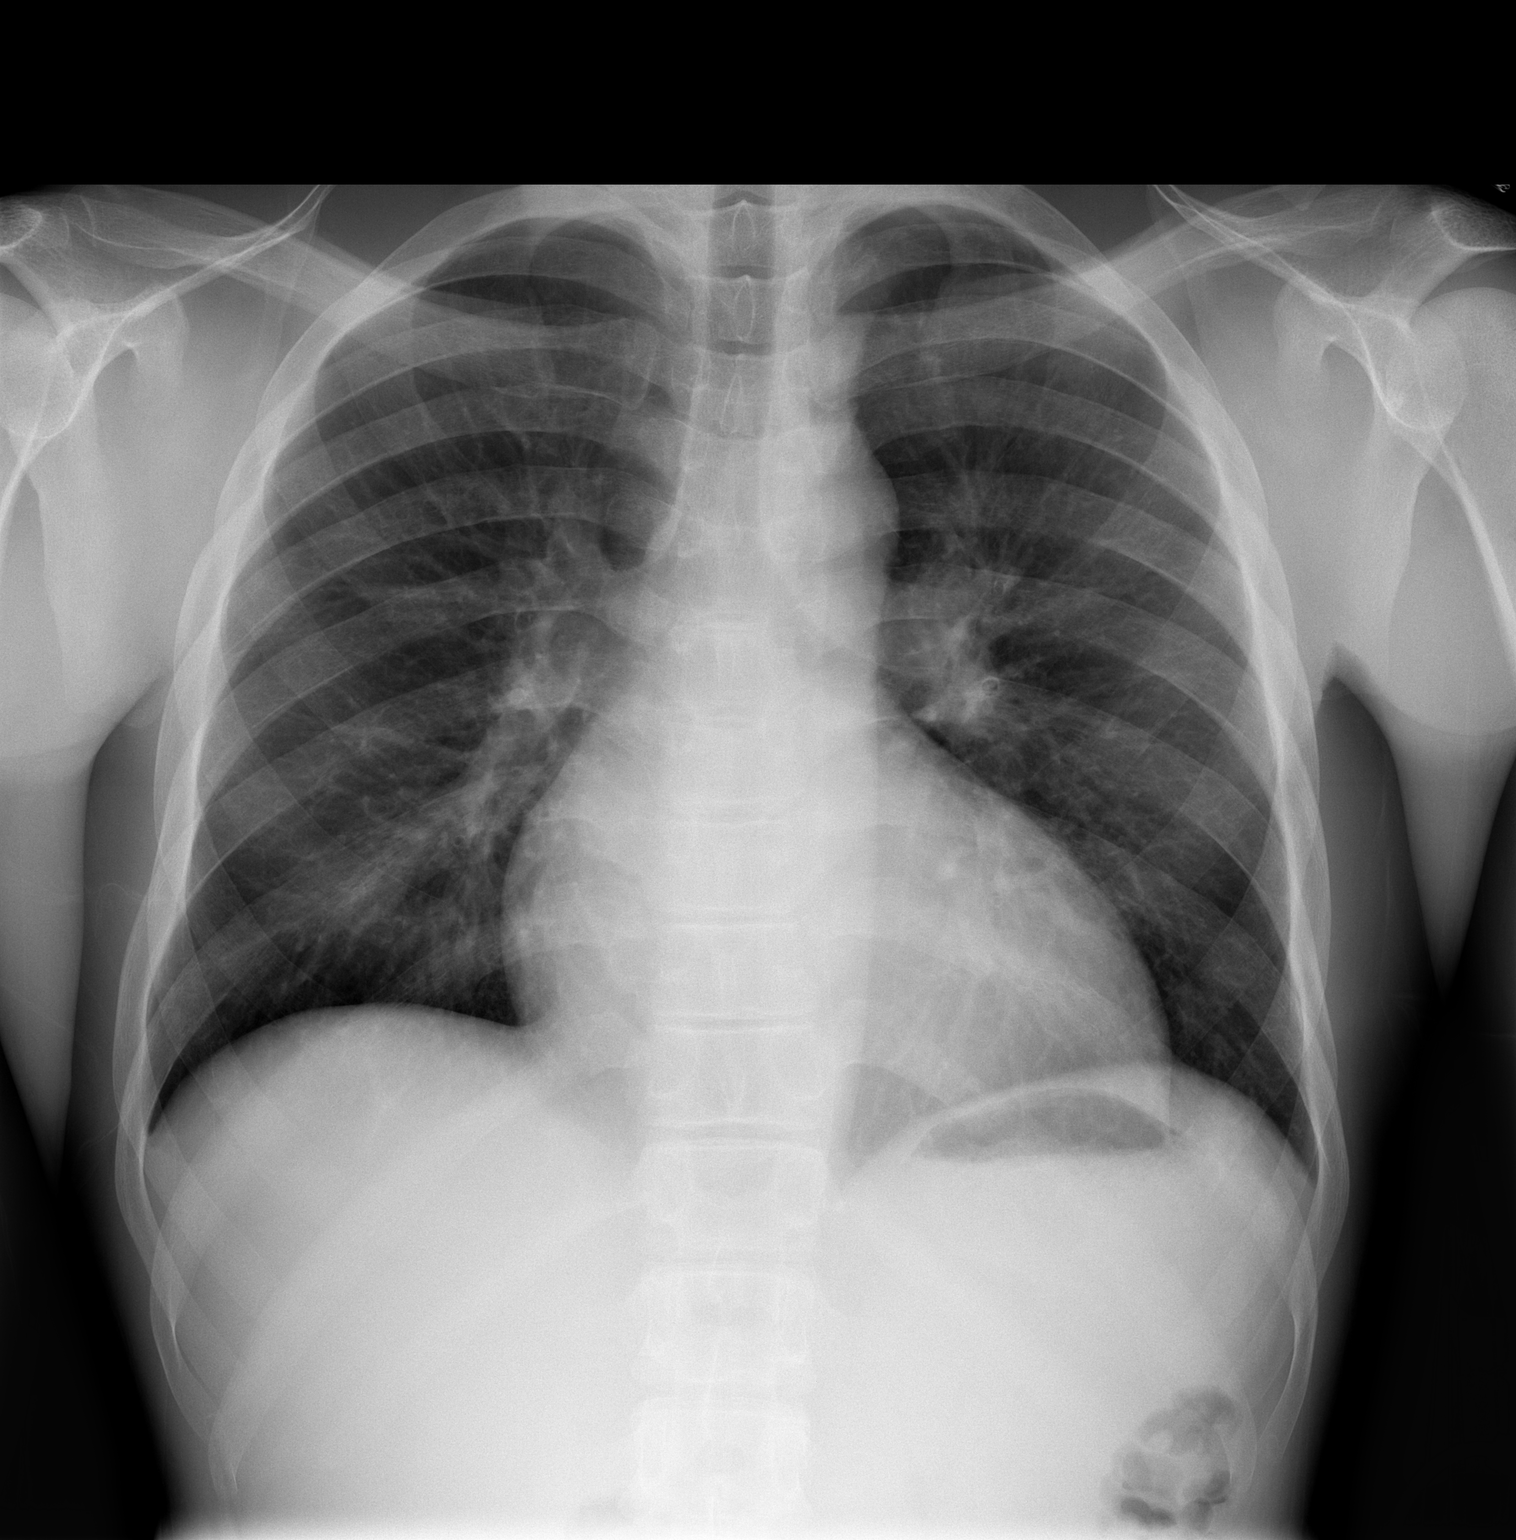

[w chest lat]
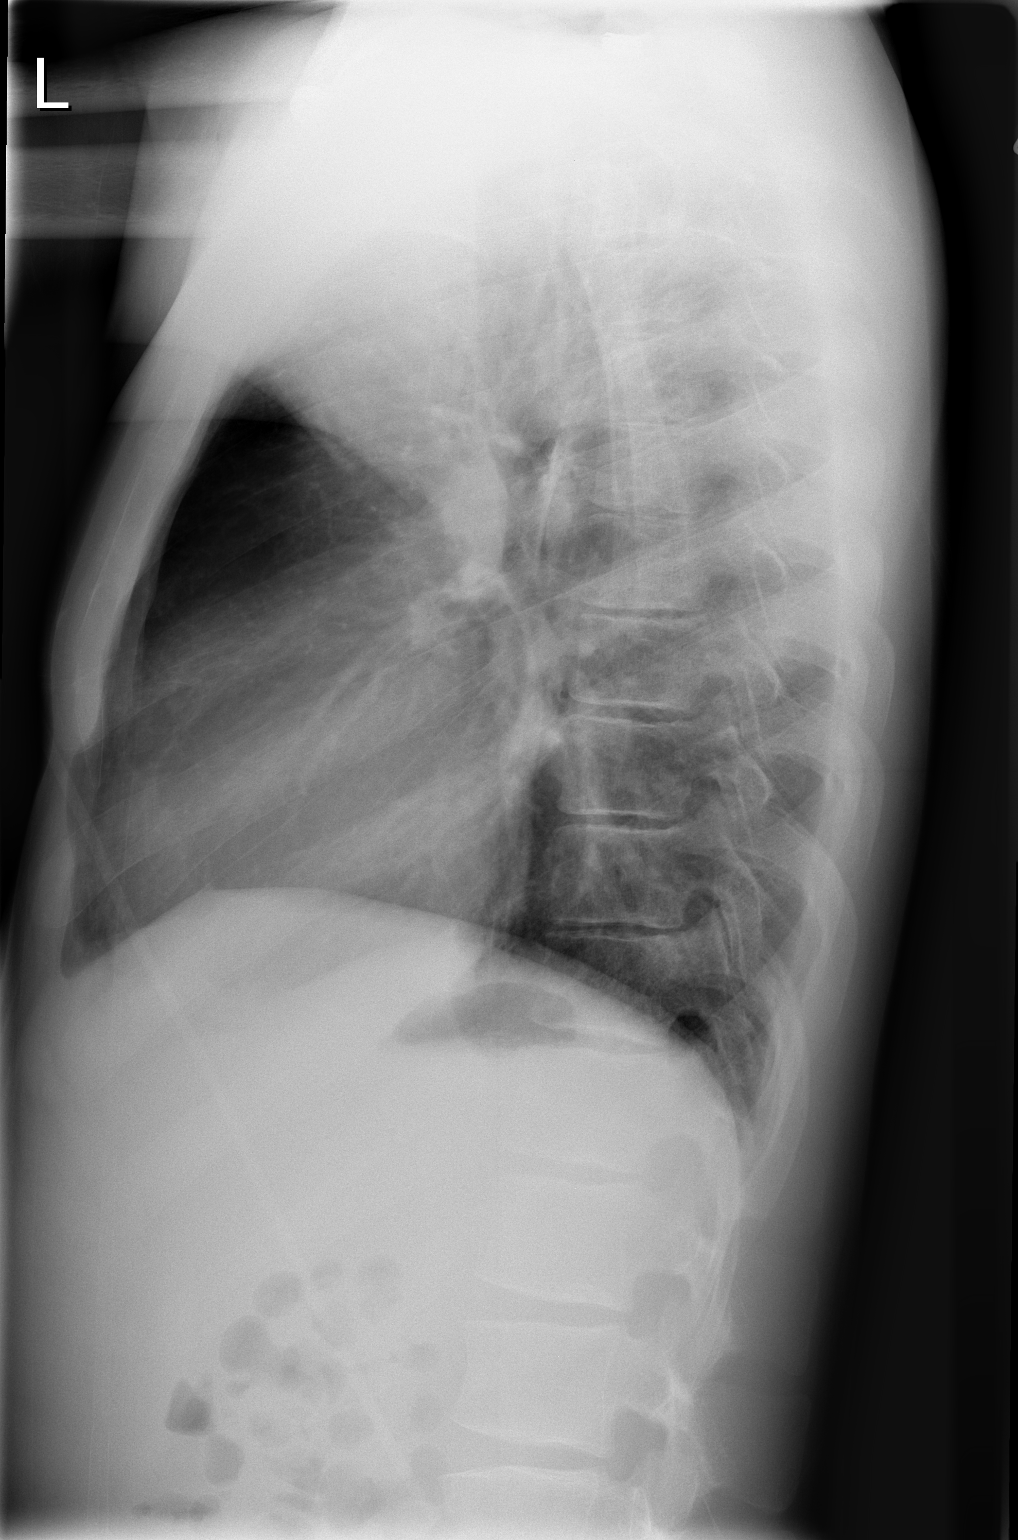

[2 of 2 positions shown; findings below may reference images not displayed]

The heart is mildly enlarged.  The pulmonary vasculature is within normal limits.  Peribronchial thickening is stable.  No focal consolidation is seen.  No pneumothoraces or effusions are seen.
IMPRESSION: Cardiomegaly without CHF.

## 2007-05-09 ENCOUNTER — Emergency Department (HOSPITAL_COMMUNITY): Admission: EM | Admit: 2007-05-09 | Discharge: 2007-05-09 | Payer: Self-pay | Admitting: Emergency Medicine

## 2007-05-10 ENCOUNTER — Inpatient Hospital Stay (HOSPITAL_COMMUNITY): Admission: EM | Admit: 2007-05-10 | Discharge: 2007-05-10 | Payer: Self-pay | Admitting: Emergency Medicine

## 2007-06-18 ENCOUNTER — Emergency Department (HOSPITAL_COMMUNITY): Admission: EM | Admit: 2007-06-18 | Discharge: 2007-06-19 | Payer: Self-pay | Admitting: Emergency Medicine

## 2007-07-02 ENCOUNTER — Emergency Department (HOSPITAL_COMMUNITY): Admission: EM | Admit: 2007-07-02 | Discharge: 2007-07-02 | Payer: Self-pay | Admitting: Emergency Medicine

## 2007-07-12 IMAGING — CR DG CHEST 2V
2 series · 2 of 2 positions shown · non-contrast
Comparison: 05/26/04

CLINICAL DATA: 24 year-old male with chest pain, sickle cell crisis.
 [id number] VIEWS:

[w chest pa]
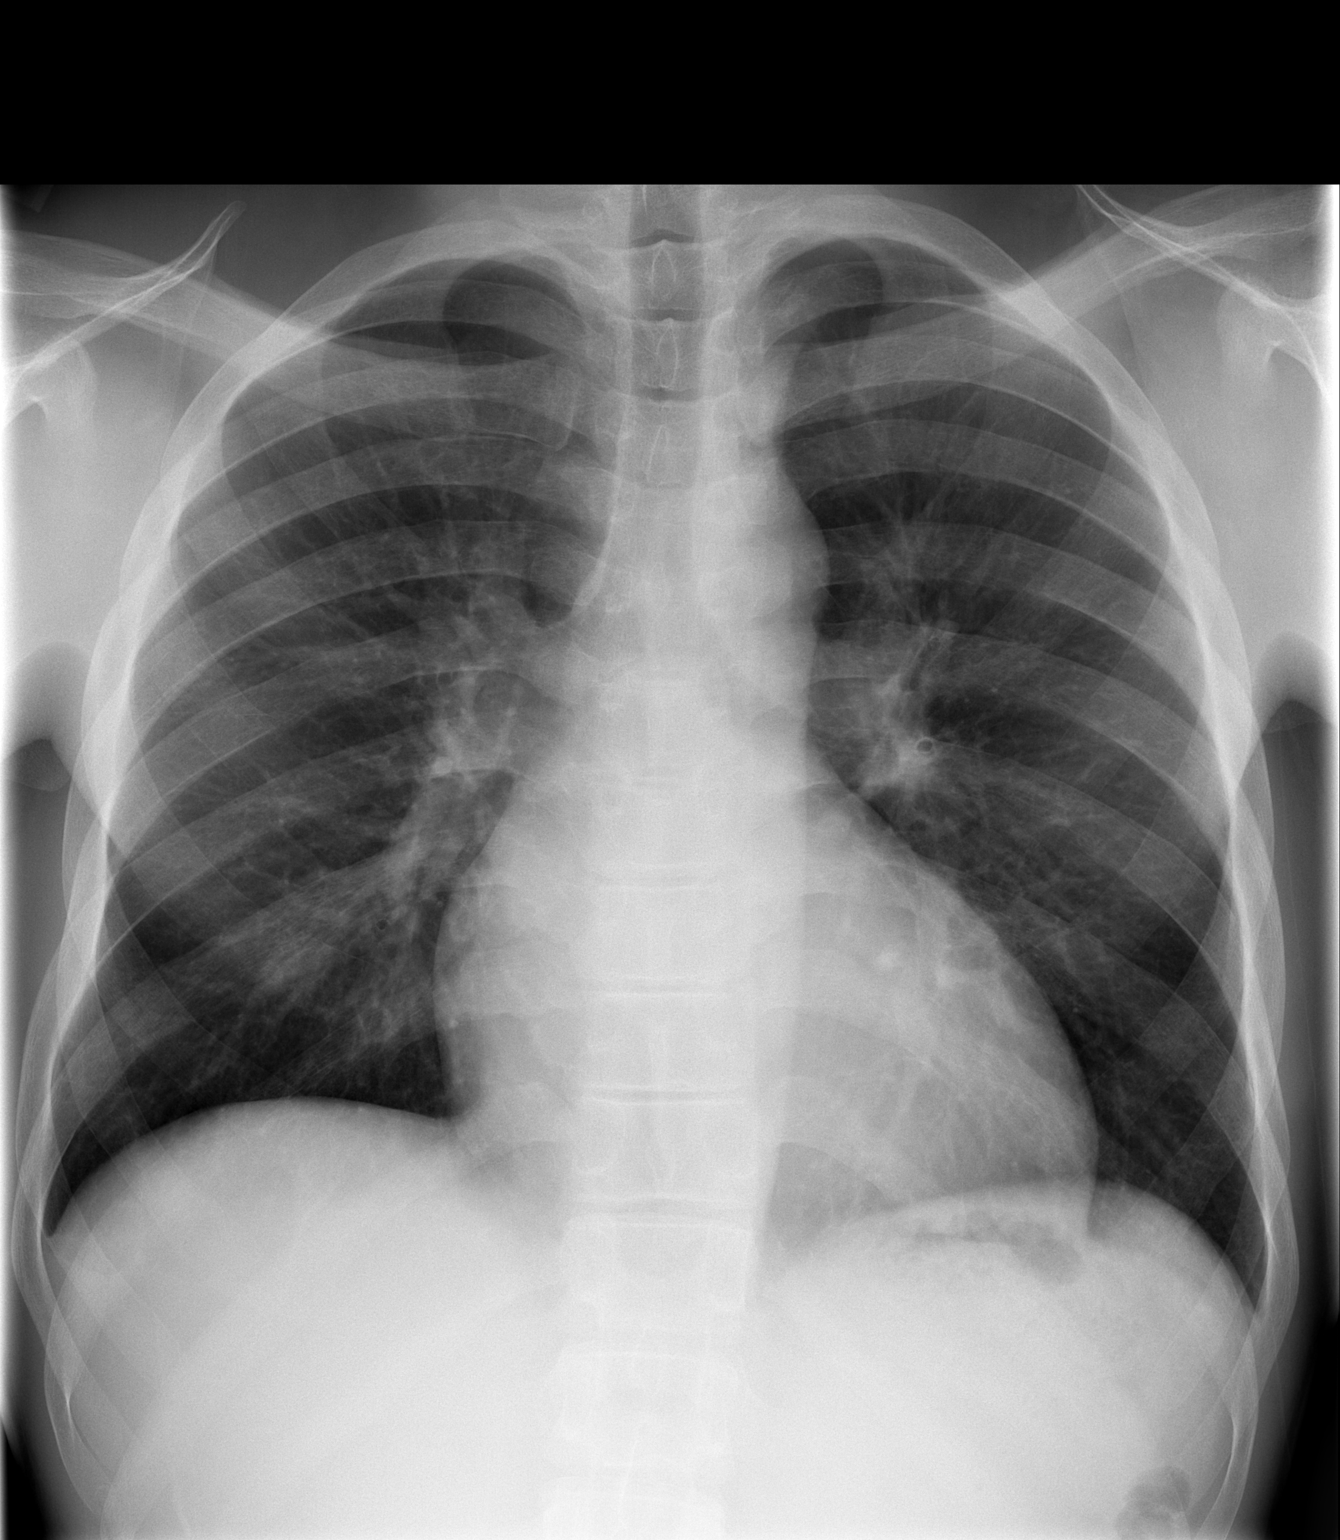

[w chest lat]
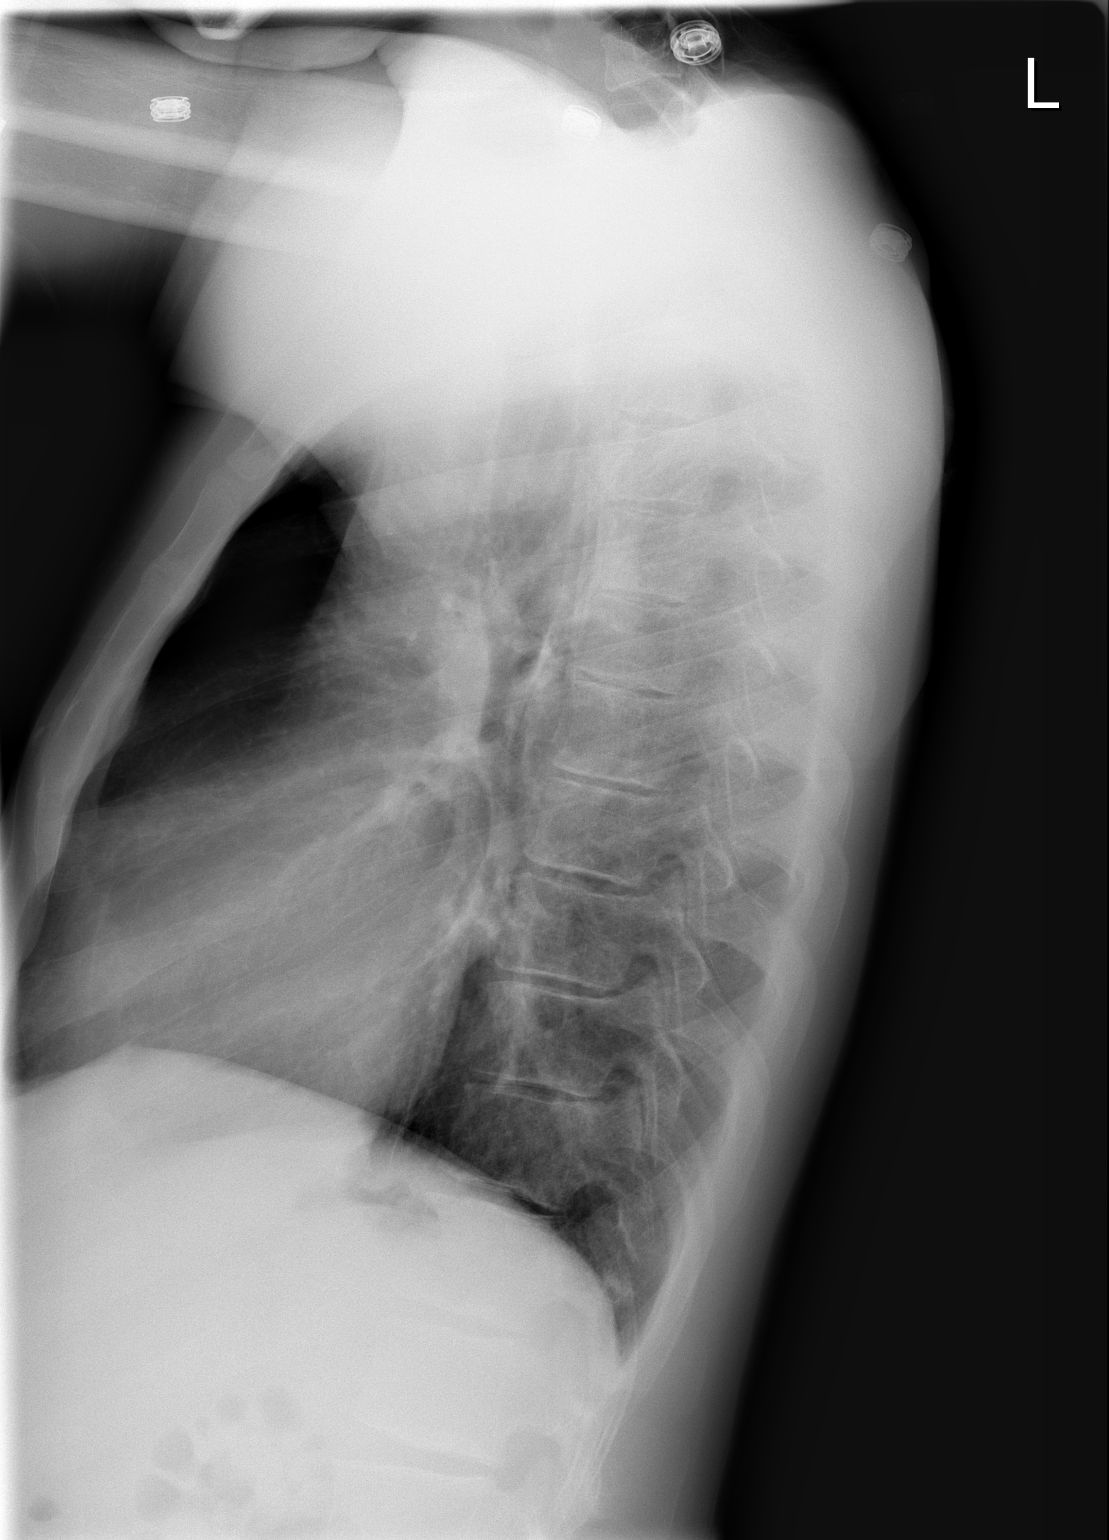

[2 of 2 positions shown; findings below may reference images not displayed]

FINDINGS: Mild cardiomegaly.  No acute consolidation, airspace disease, edema, effusion or pneumothorax.  Exam is stable.
IMPRESSION: Stable cardiomegaly.

## 2007-07-29 ENCOUNTER — Emergency Department (HOSPITAL_COMMUNITY): Admission: EM | Admit: 2007-07-29 | Discharge: 2007-07-29 | Payer: Self-pay | Admitting: Emergency Medicine

## 2007-08-03 ENCOUNTER — Emergency Department (HOSPITAL_COMMUNITY): Admission: EM | Admit: 2007-08-03 | Discharge: 2007-08-04 | Payer: Self-pay | Admitting: Emergency Medicine
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
## 2007-08-18 ENCOUNTER — Inpatient Hospital Stay (HOSPITAL_COMMUNITY): Admission: EM | Admit: 2007-08-18 | Discharge: 2007-08-21 | Payer: Self-pay | Admitting: Emergency Medicine

## 2007-09-17 IMAGING — CR DG CHEST 2V
2 series · 2 of 2 positions shown · non-contrast
Comparison: 08/18/2004

CLINICAL DATA: Sickle cell, chest and shoulder pain

RIGHT SHOULDER - 3  VIEW:

[w chest pa]
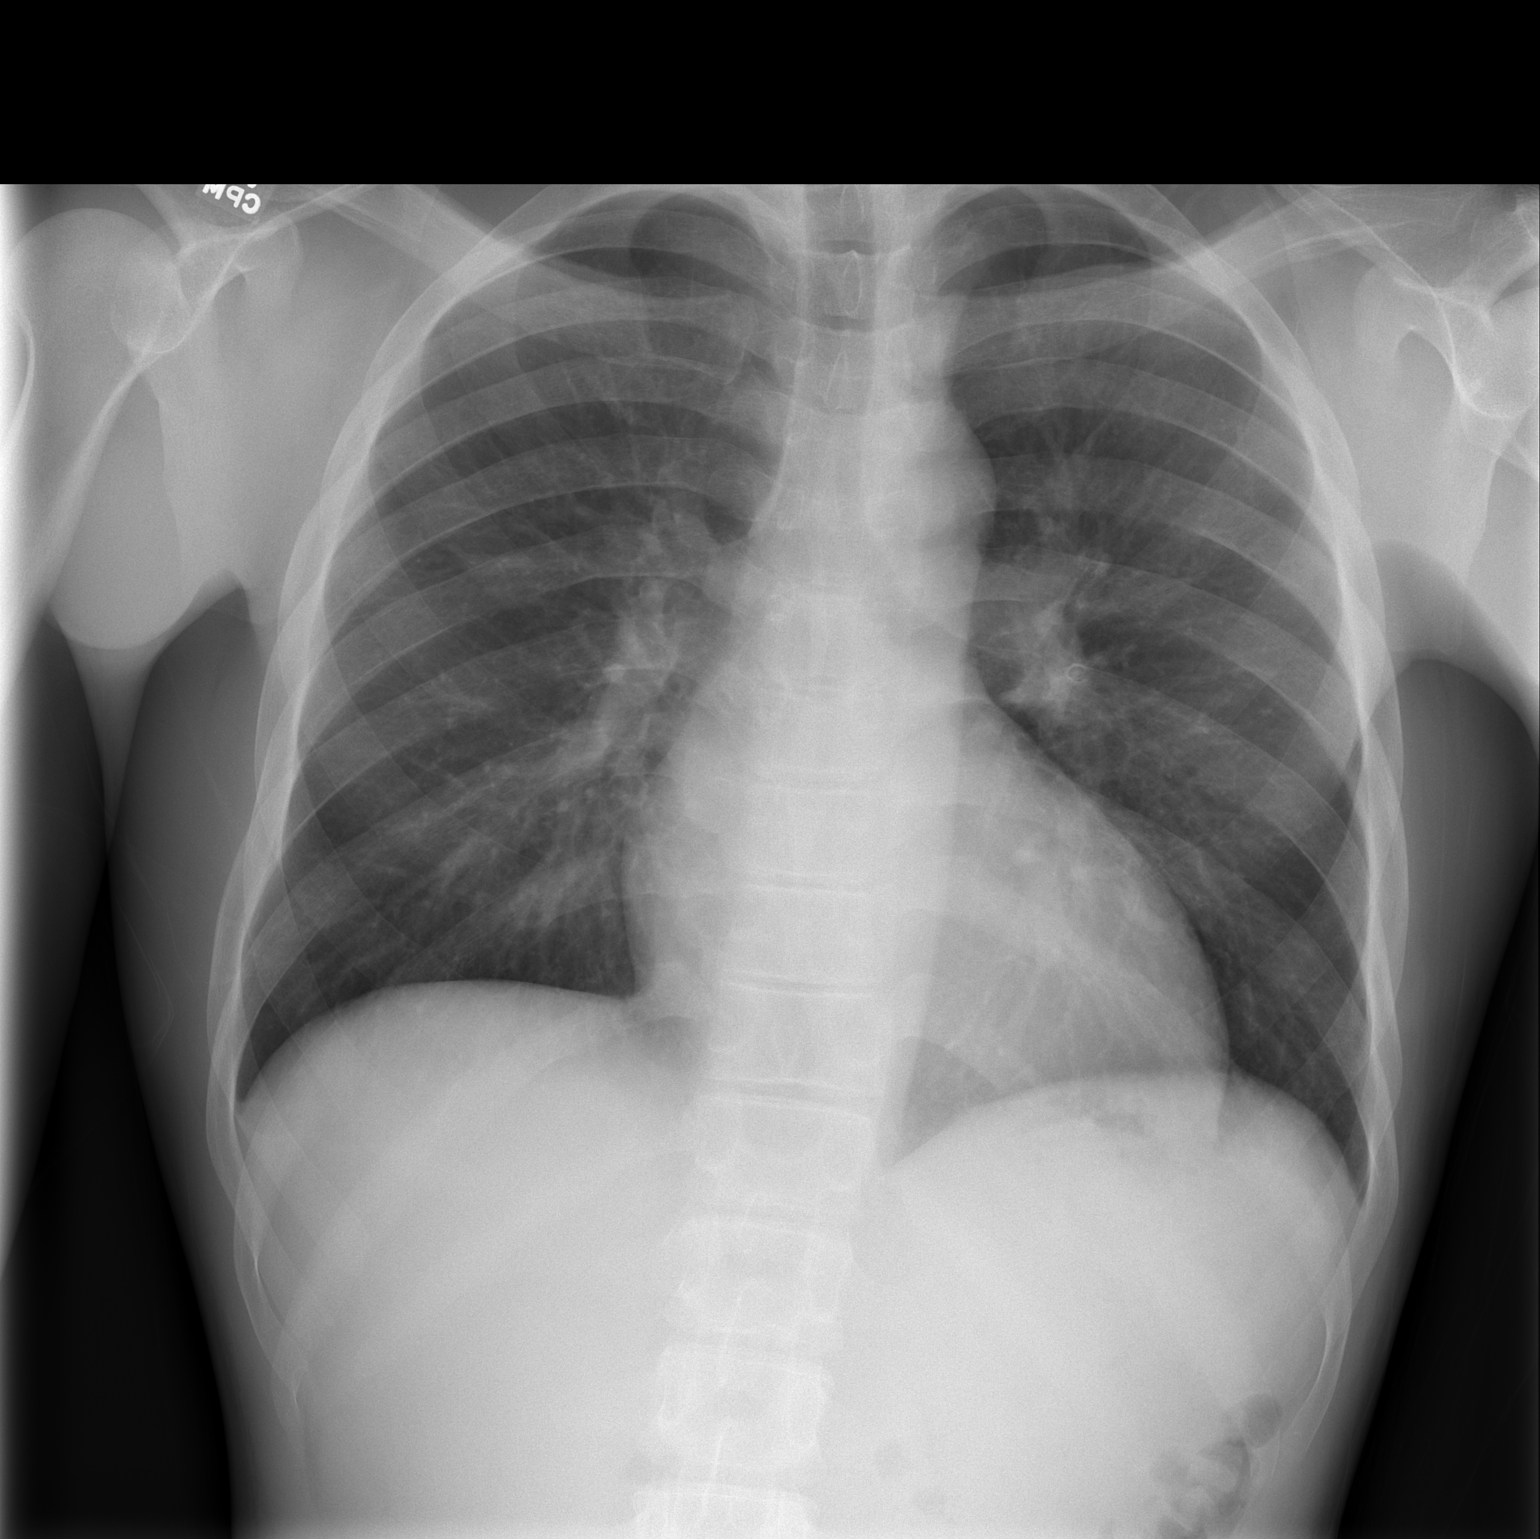

[w chest lat]
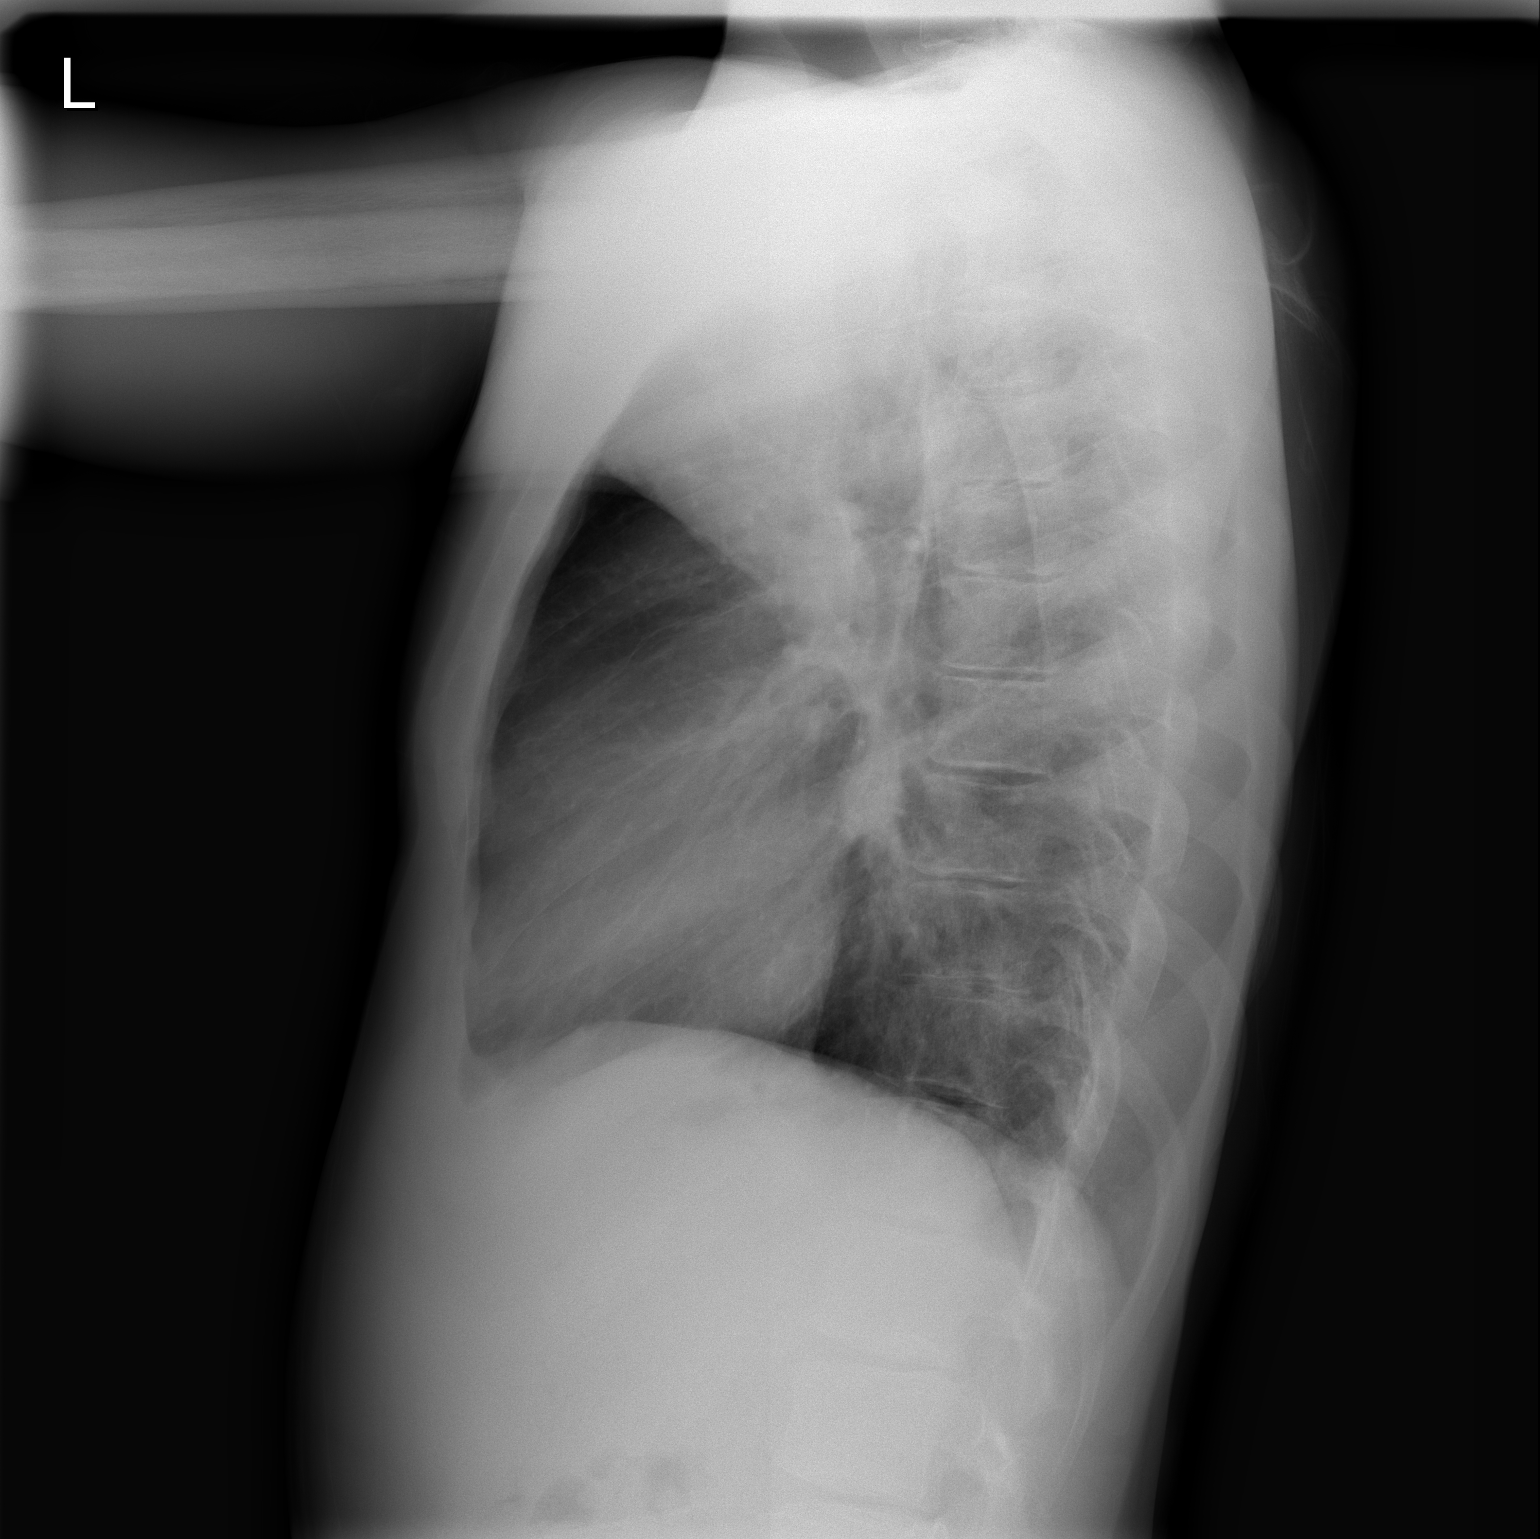

[2 of 2 positions shown; findings below may reference images not displayed]

FINDINGS: There is no evidence of fracture or dislocation.  There is no
evidence of arthropathy or other focal bone abnormality.  Soft tissues are
unremarkable.
IMPRESSION: Negative.

CHEST - 2 VIEW:
FINDINGS: Mild cardiomegaly, stable. No focal airspace opacities or effusions.
Visualized skeleton unremarkable.
IMPRESSION: Cardiomegaly. No active disease.

## 2007-09-17 IMAGING — CR DG SHOULDER 2+V*R*
3 series · 3 of 3 positions shown · non-contrast
Comparison: 08/18/2004

CLINICAL DATA: Sickle cell, chest and shoulder pain

RIGHT SHOULDER - 3  VIEW:

[w shoulder ap internal right *]
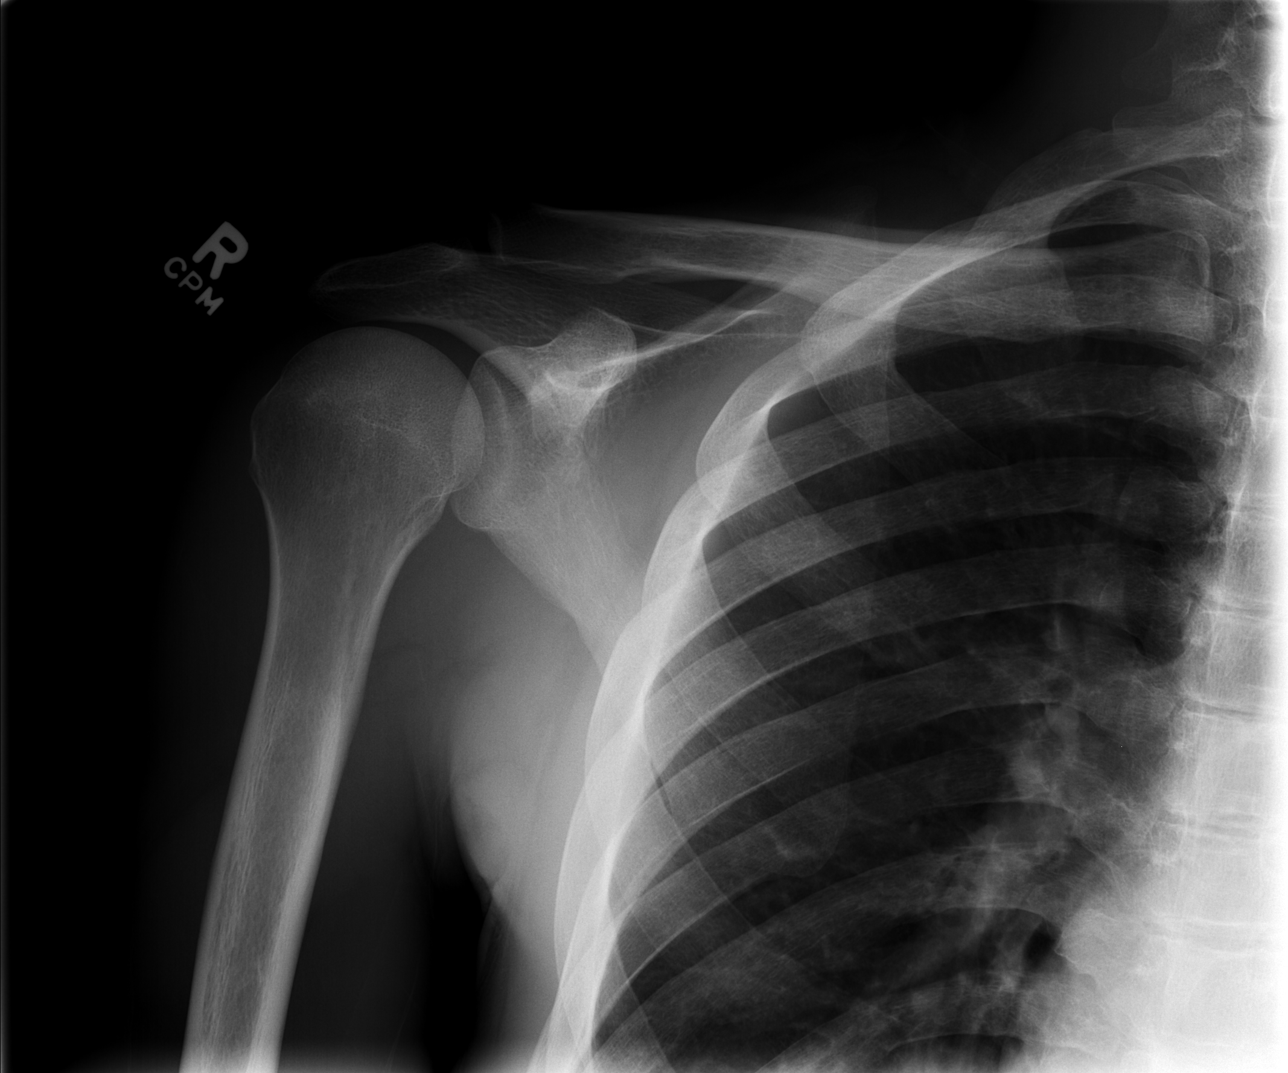

[w shoulder ap external right *]
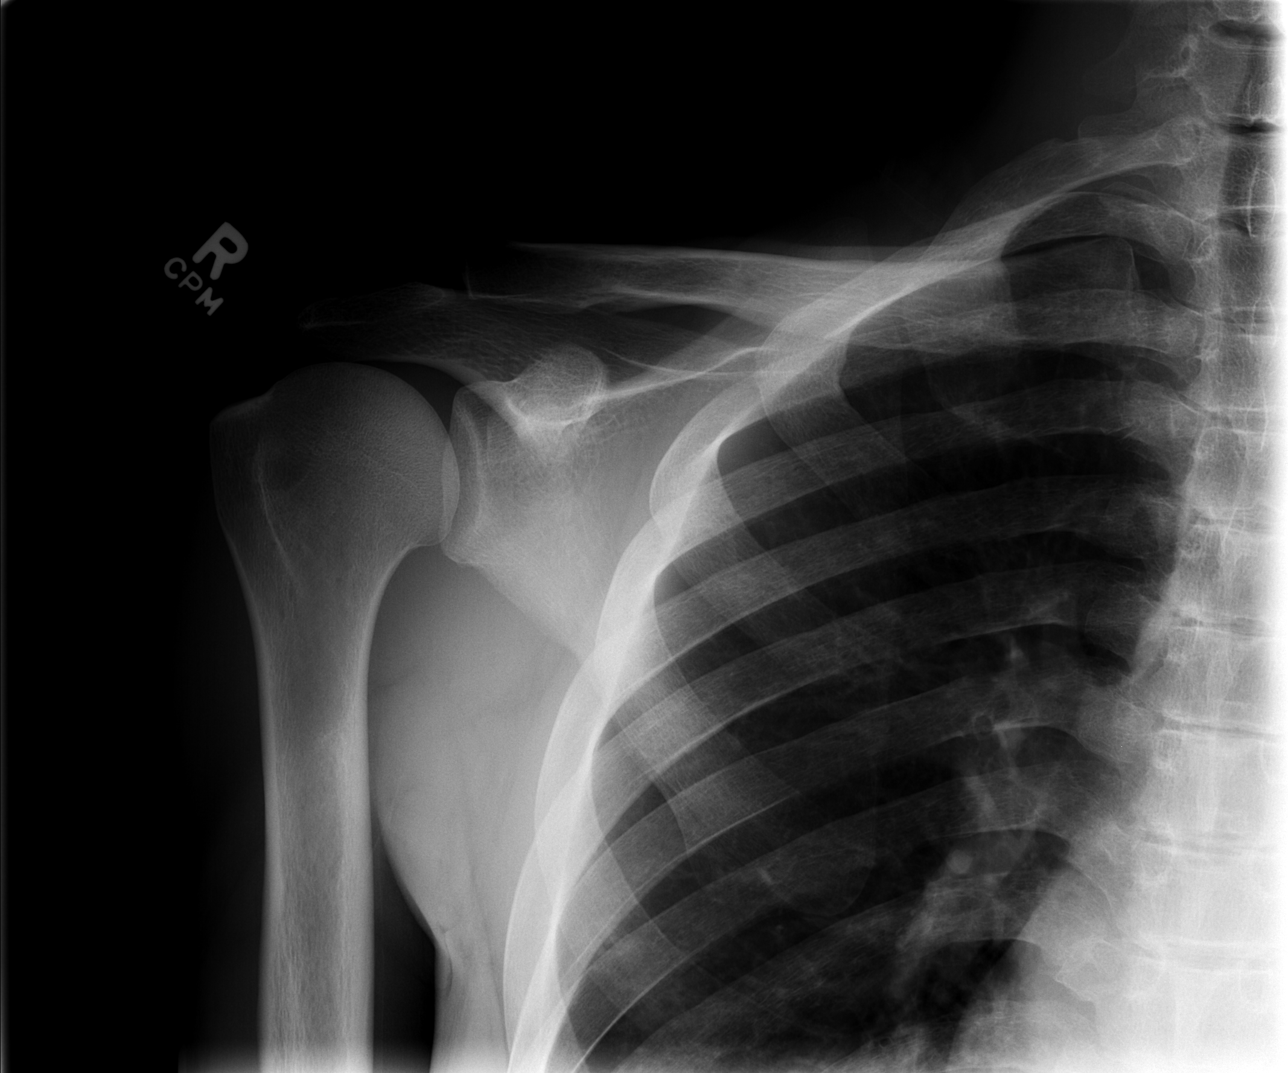

[w shoulder axillary right *]
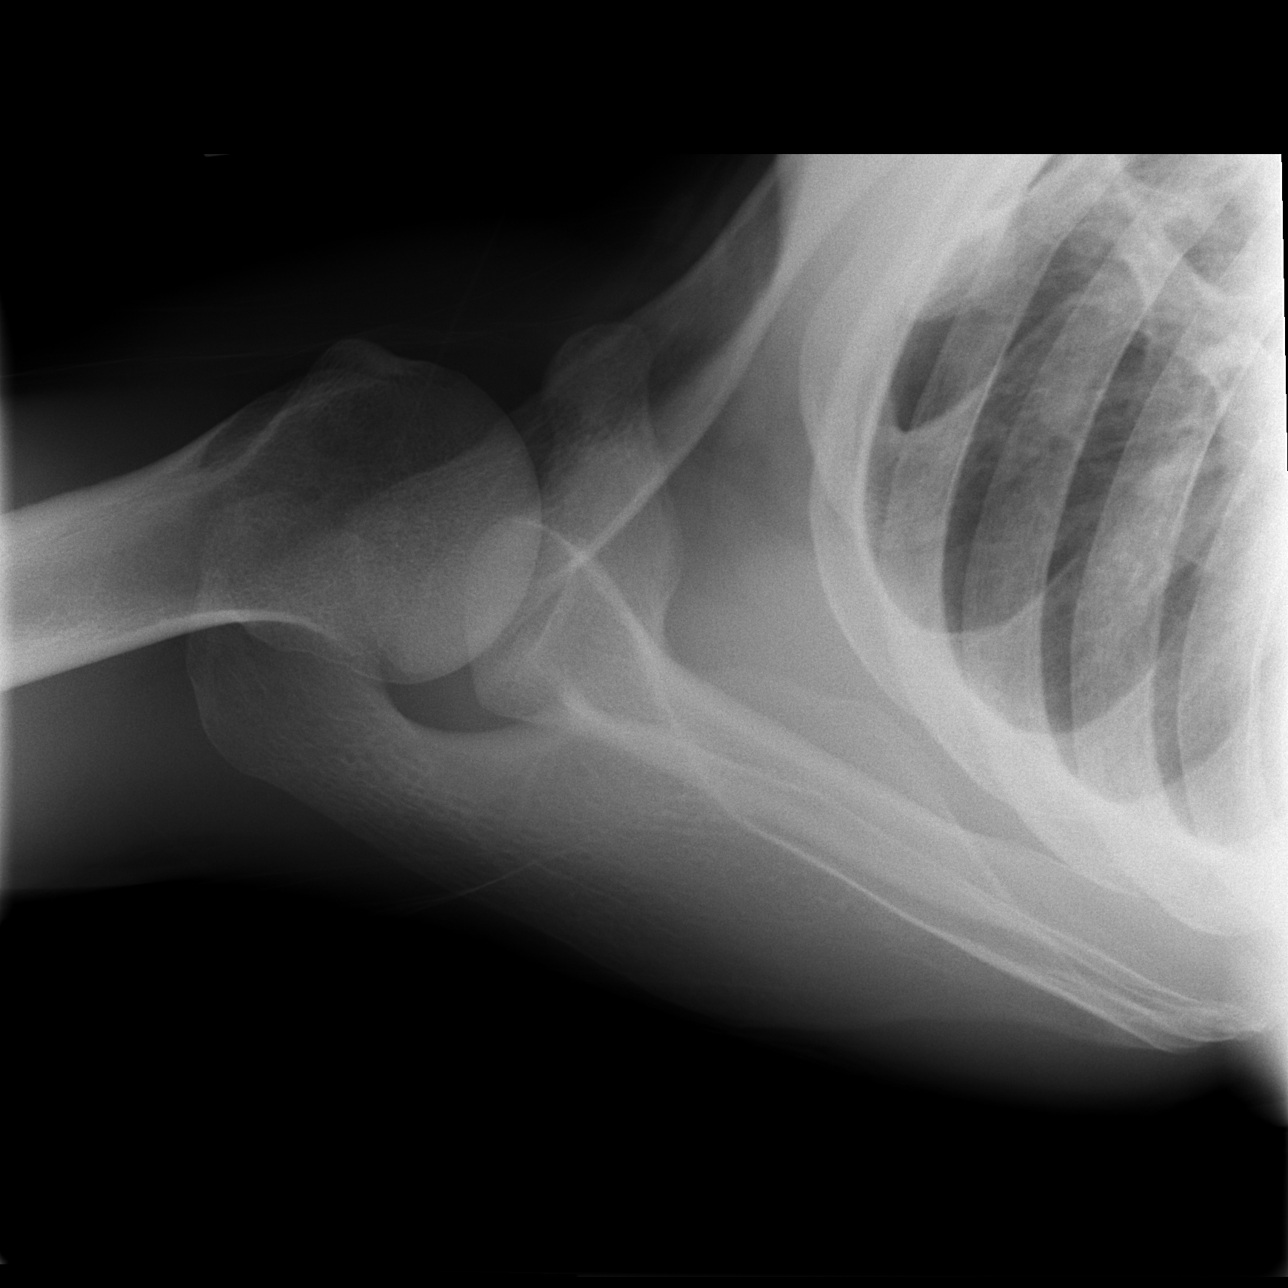

[3 of 3 positions shown; findings below may reference images not displayed]

FINDINGS: There is no evidence of fracture or dislocation.  There is no
evidence of arthropathy or other focal bone abnormality.  Soft tissues are
unremarkable.
IMPRESSION: Negative.

CHEST - 2 VIEW:
FINDINGS: Mild cardiomegaly, stable. No focal airspace opacities or effusions.
Visualized skeleton unremarkable.
IMPRESSION: Cardiomegaly. No active disease.

## 2007-09-22 ENCOUNTER — Emergency Department (HOSPITAL_COMMUNITY): Admission: EM | Admit: 2007-09-22 | Discharge: 2007-09-22 | Payer: Self-pay | Admitting: Emergency Medicine

## 2007-09-25 ENCOUNTER — Emergency Department (HOSPITAL_COMMUNITY): Admission: EM | Admit: 2007-09-25 | Discharge: 2007-09-25 | Payer: Self-pay | Admitting: Emergency Medicine

## 2007-09-25 ENCOUNTER — Inpatient Hospital Stay (HOSPITAL_COMMUNITY): Admission: EM | Admit: 2007-09-25 | Discharge: 2007-09-29 | Payer: Self-pay | Admitting: Emergency Medicine

## 2007-10-04 IMAGING — CR DG CHEST 2V
2 series · 2 of 2 positions shown · non-contrast
Comparison: none

CLINICAL DATA: Sickle cell crisis.  
 CHEST - 2 VIEW:
 Central bronchitic changes are unchanged.  The lungs are otherwise clear.  The heart is upper normal in size. No pneumothoraces or effusions are seen.

[view not recorded (1 of 2)]
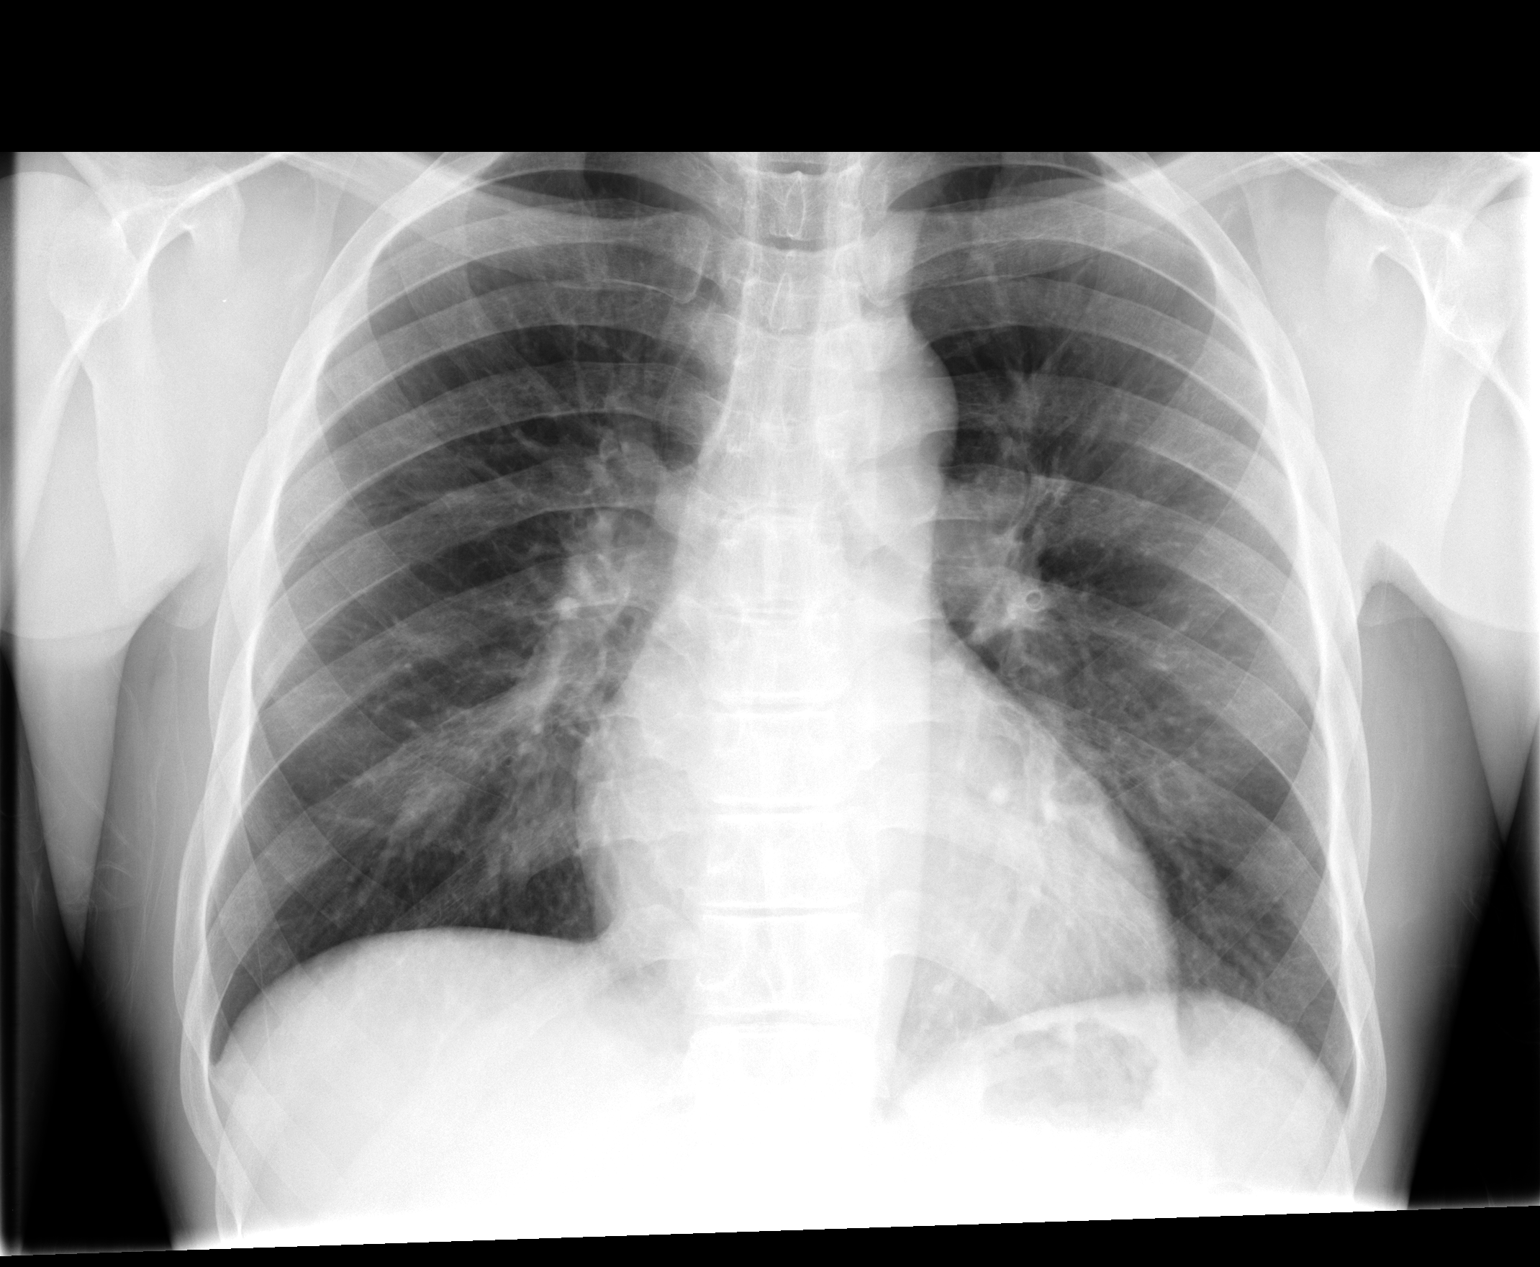

[view not recorded (2 of 2)]
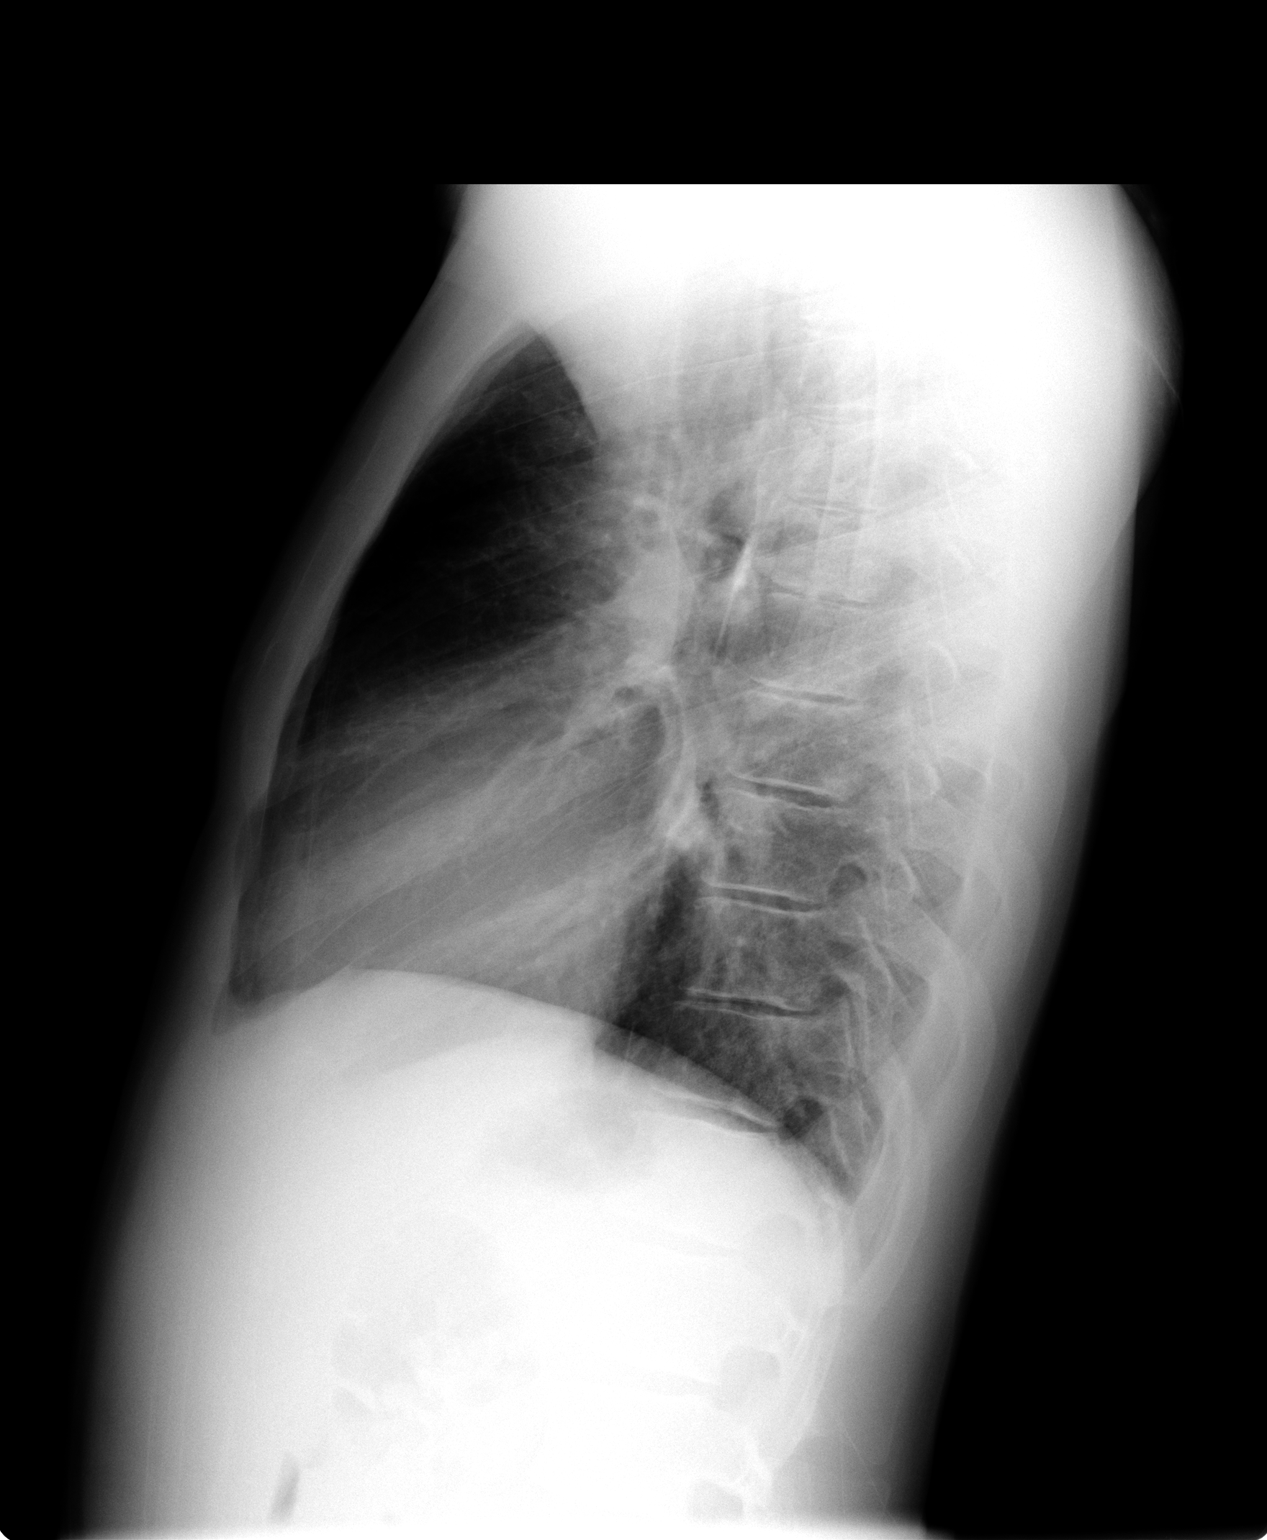

[2 of 2 positions shown; findings below may reference images not displayed]

IMPRESSION: Stable bronchitic changes.

## 2007-10-04 IMAGING — CT CT HEAD W/O CM
1 series · 16 of 30 positions shown, 20 images · IV contrast (agent unspecified)
Comparison: none

CLINICAL DATA: Sickle cell pain, crisis. Headache. 
 HEAD CT WITHOUT CONTRAST:
TECHNIQUE: Contiguous axial images were obtained from the base of the skull through the vertex according to standard protocol without contrast.
 There is no evidence of intracranial hemorrhage, brain edema, or mass effect.  No other intra-axial abnormalities are seen, and the ventricles are within normal limits.  No abnormal extra-axial fluid collections or masses are identified.  No skull abnormalities are noted.

[Series 2: head_seq 4.5 h42s st · axial · 0.43mm/px · z∈[-167,-23]mm · 16 of 36 slices shown, 20 images]
[im 2/36  brain]
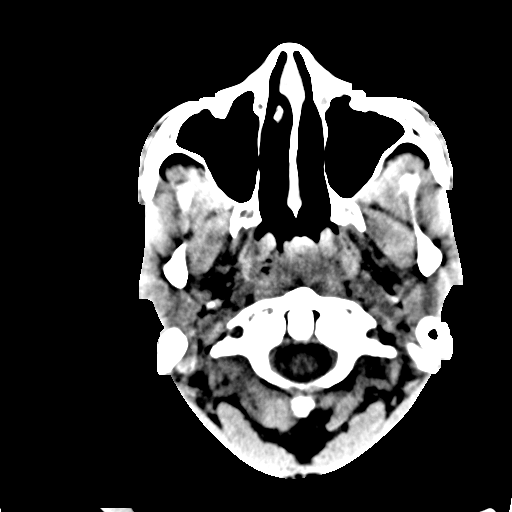
[im 2/36  bone]
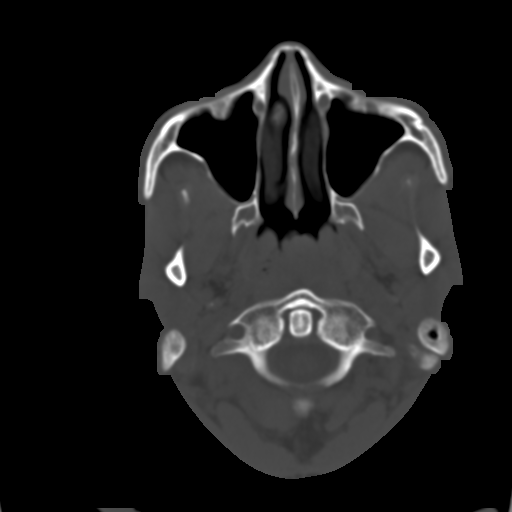
[im 4/36  brain]
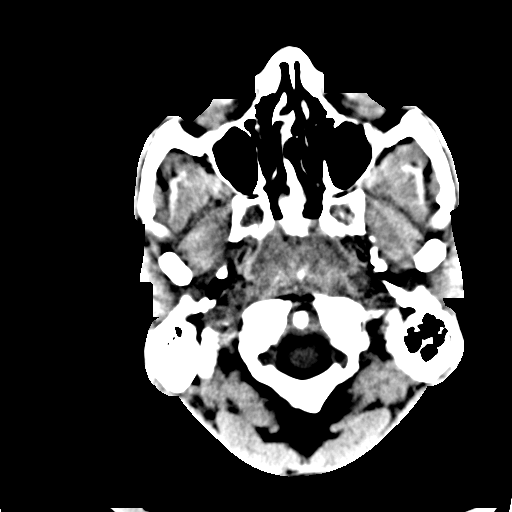
[im 7/36  brain]
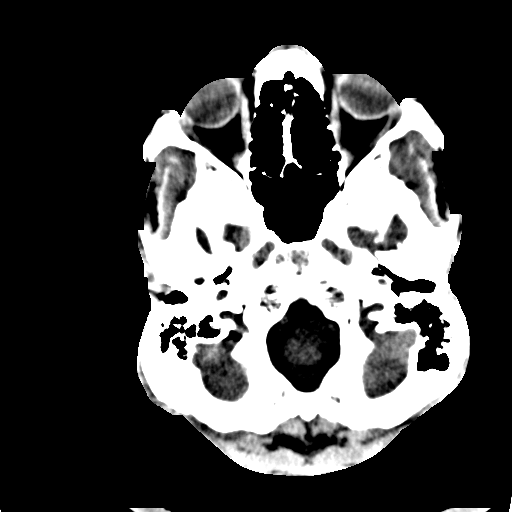
[im 9/36  brain]
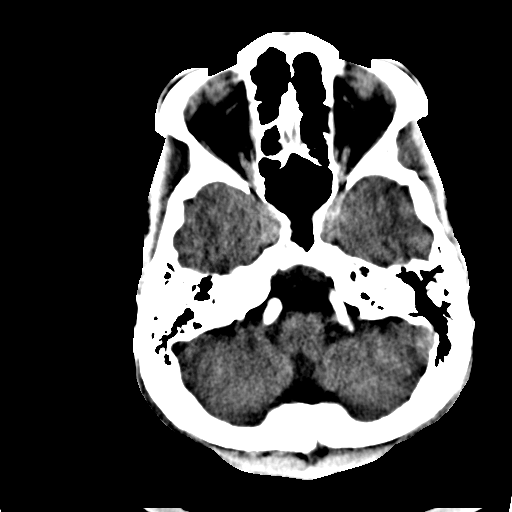
[im 10/36  brain]
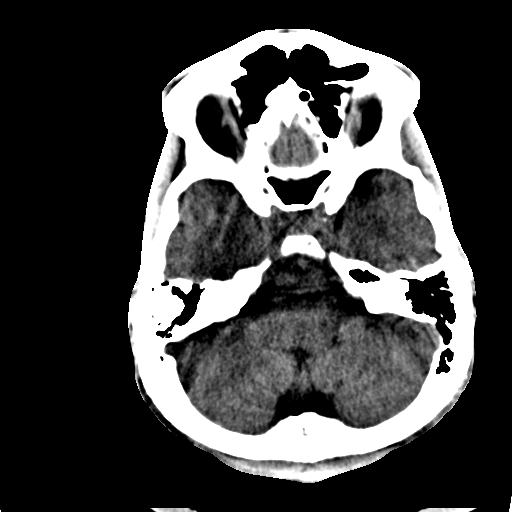
[im 10/36  bone]
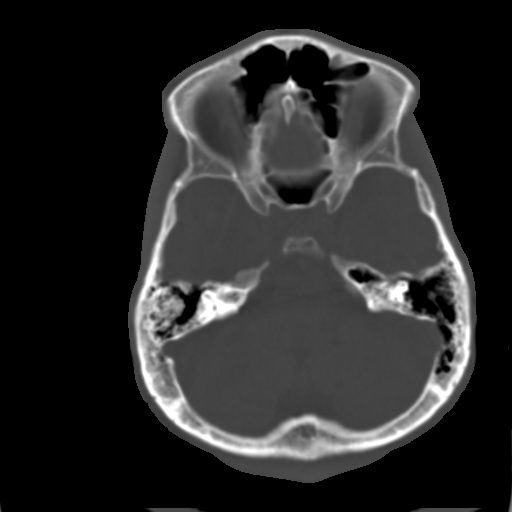
[im 13/36  brain]
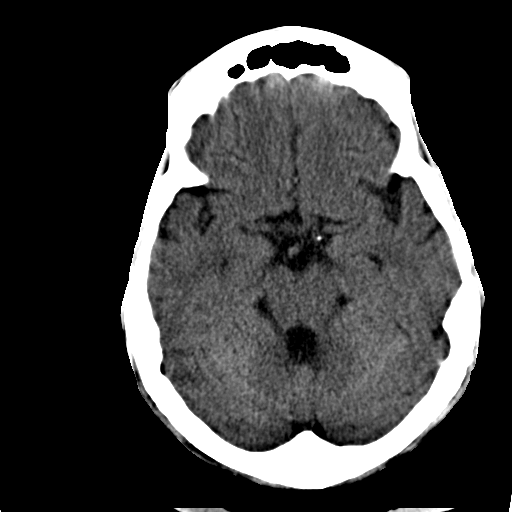
[im 15/36  brain]
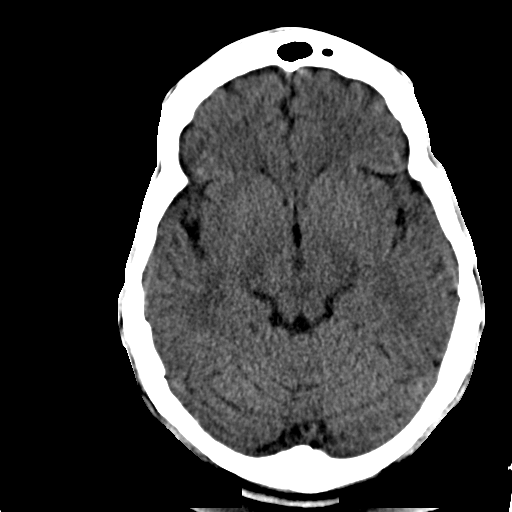
[im 17/36  brain]
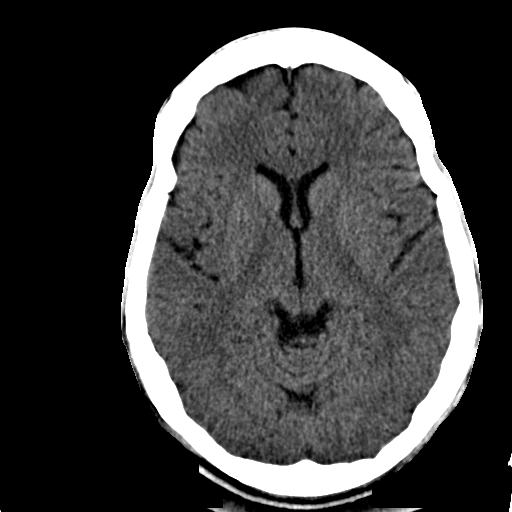
[im 19/36  brain]
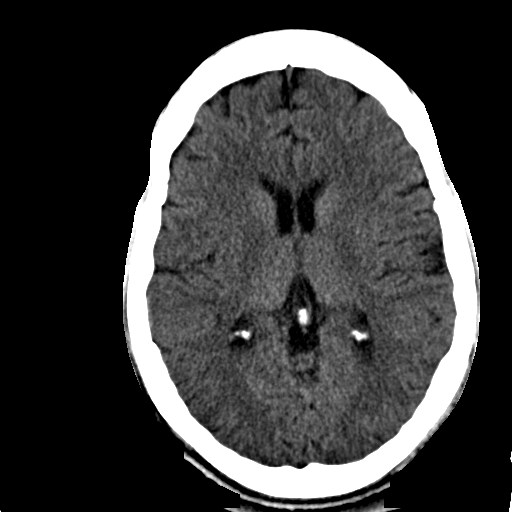
[im 19/36  bone]
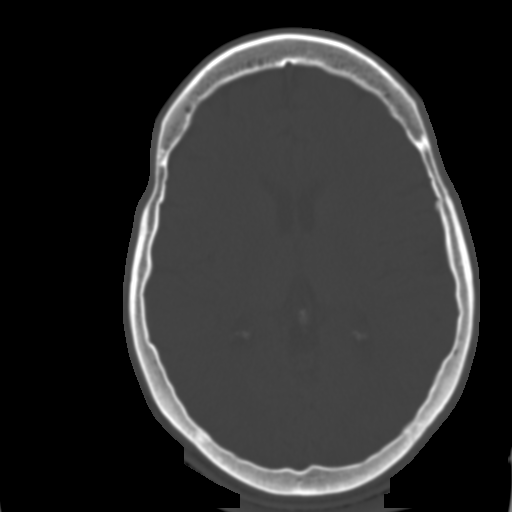
[im 21/36  brain]
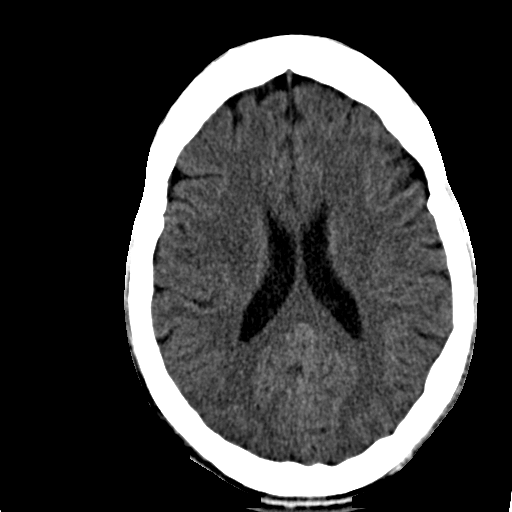
[im 23/36  brain]
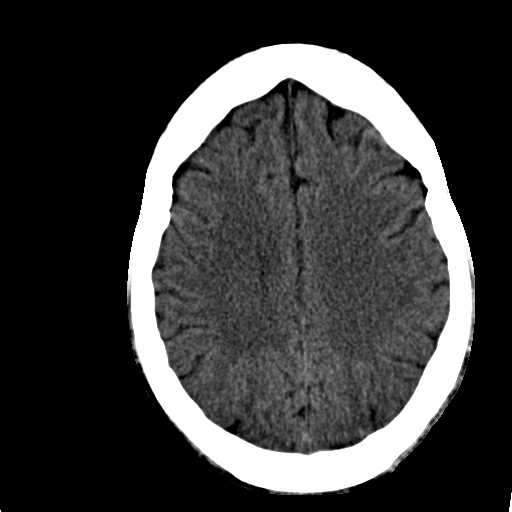
[im 26/36  brain]
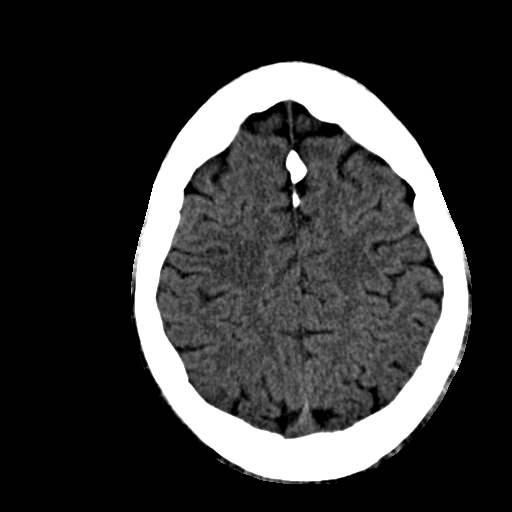
[im 27/36  brain]
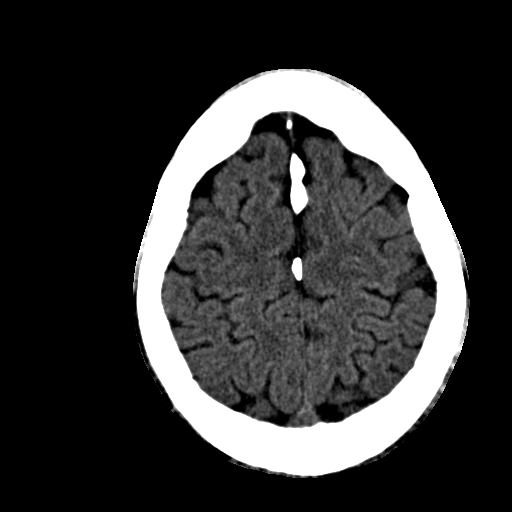
[im 27/36  bone]
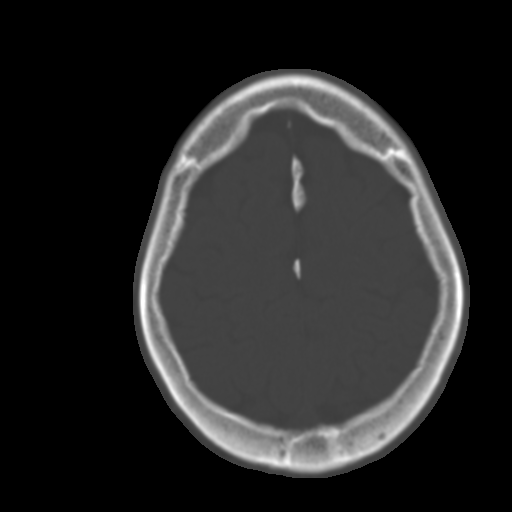
[im 29/36  brain]
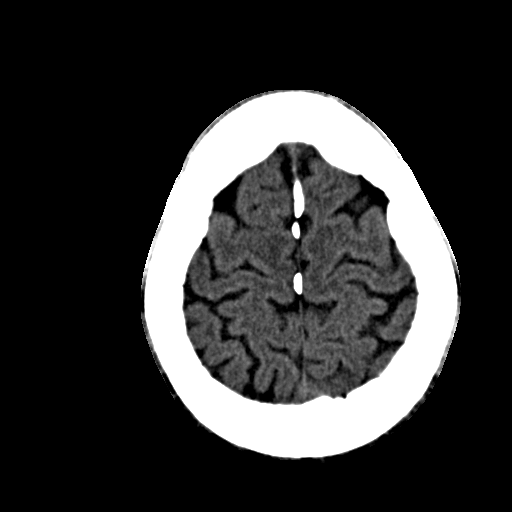
[im 32/36  brain]
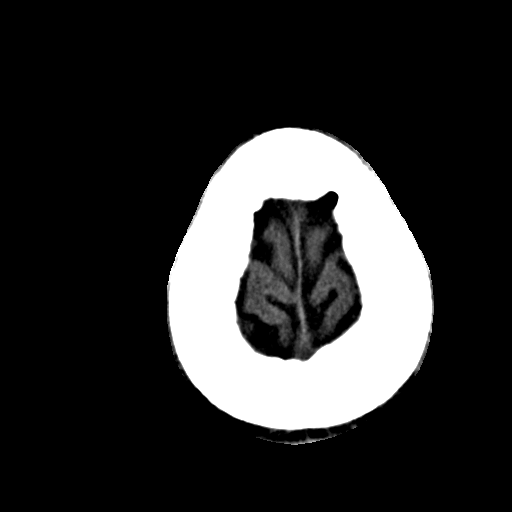
[im 34/36  brain]
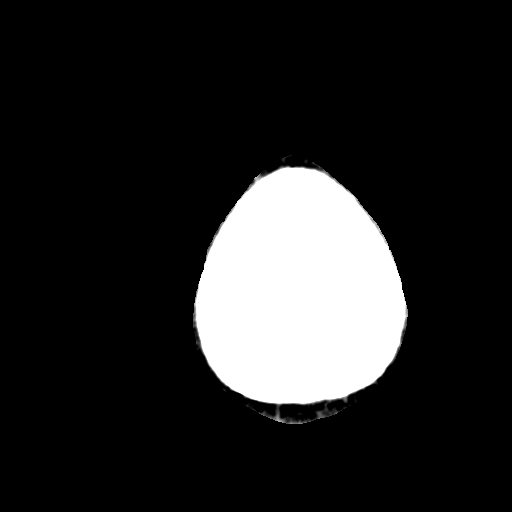

[16 of 30 positions shown; findings below may reference images not displayed]

IMPRESSION: Negative non-contrast head CT.

## 2007-10-14 IMAGING — CR DG CHEST 2V
2 series · 2 of 2 positions shown · non-contrast
Comparison: 11/10/04.

CLINICAL DATA: 24-year-old with sickle cell pain crisis.  Shortness of breath.
 CHEST ? TWO VIEW:

[view not recorded (1 of 2)]
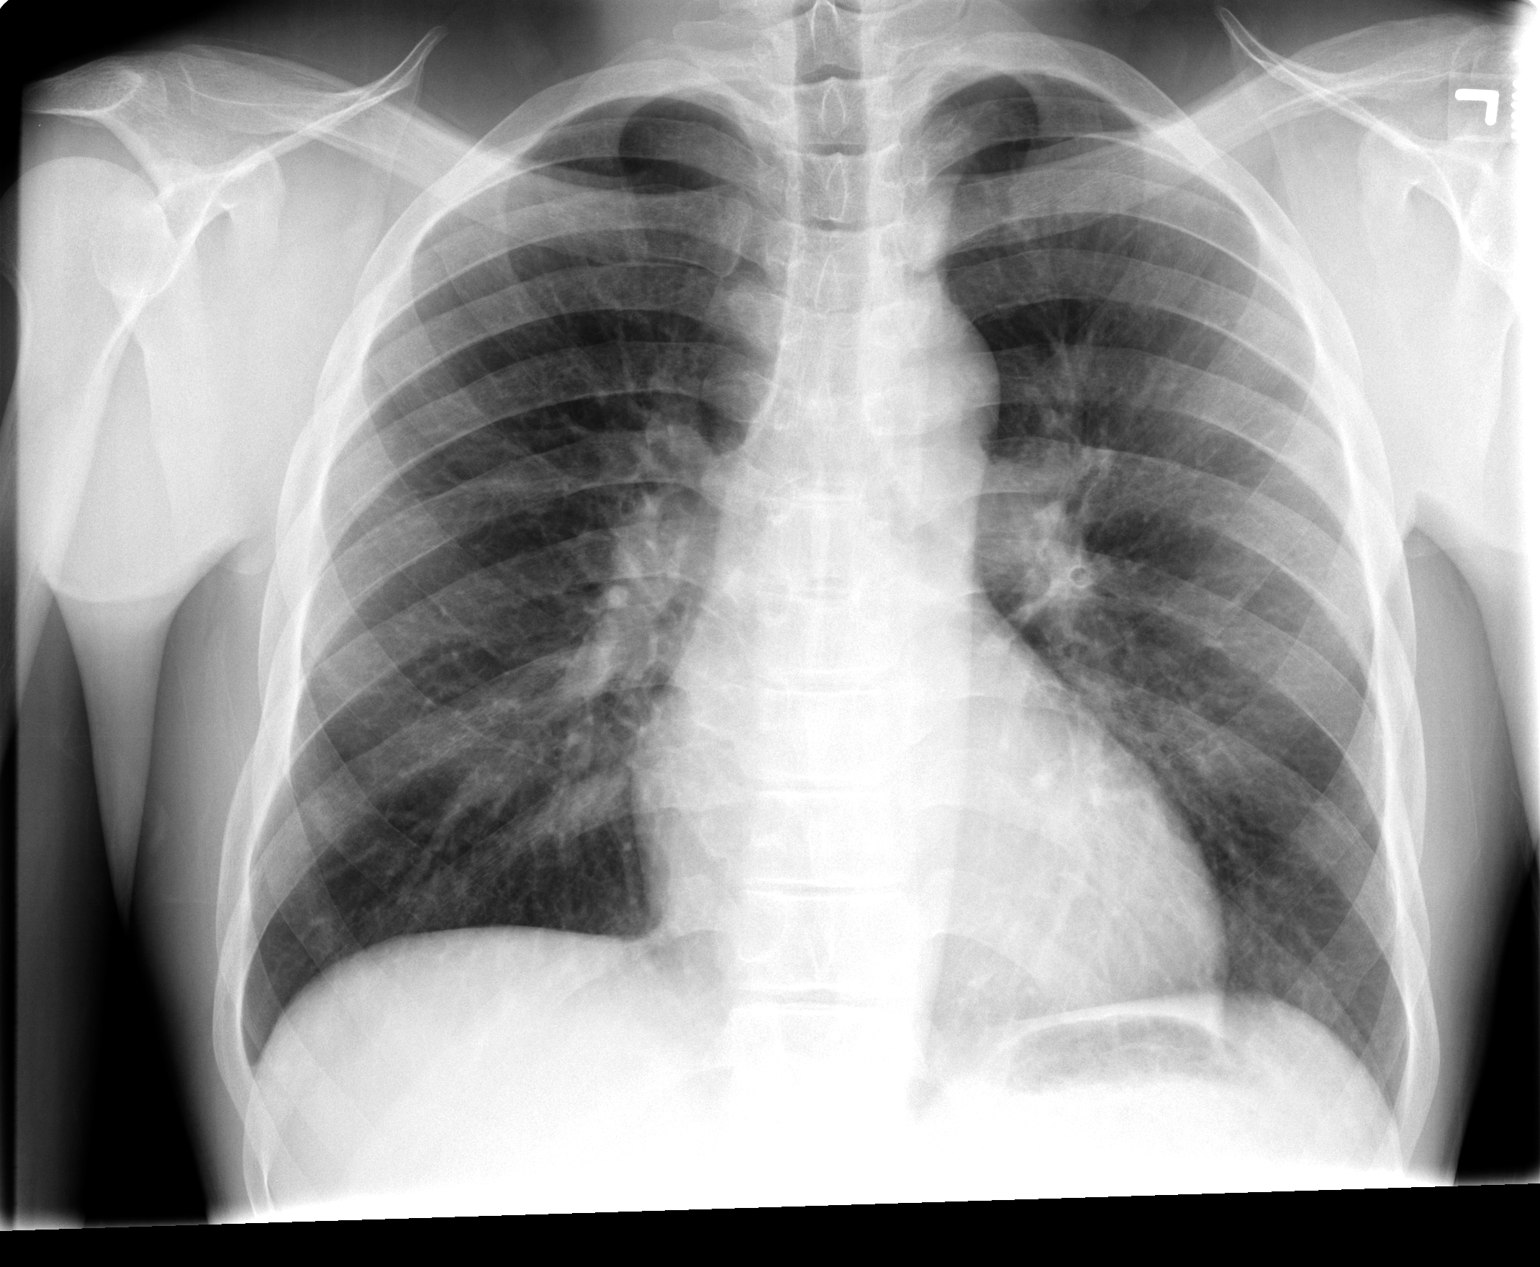

[view not recorded (2 of 2)]
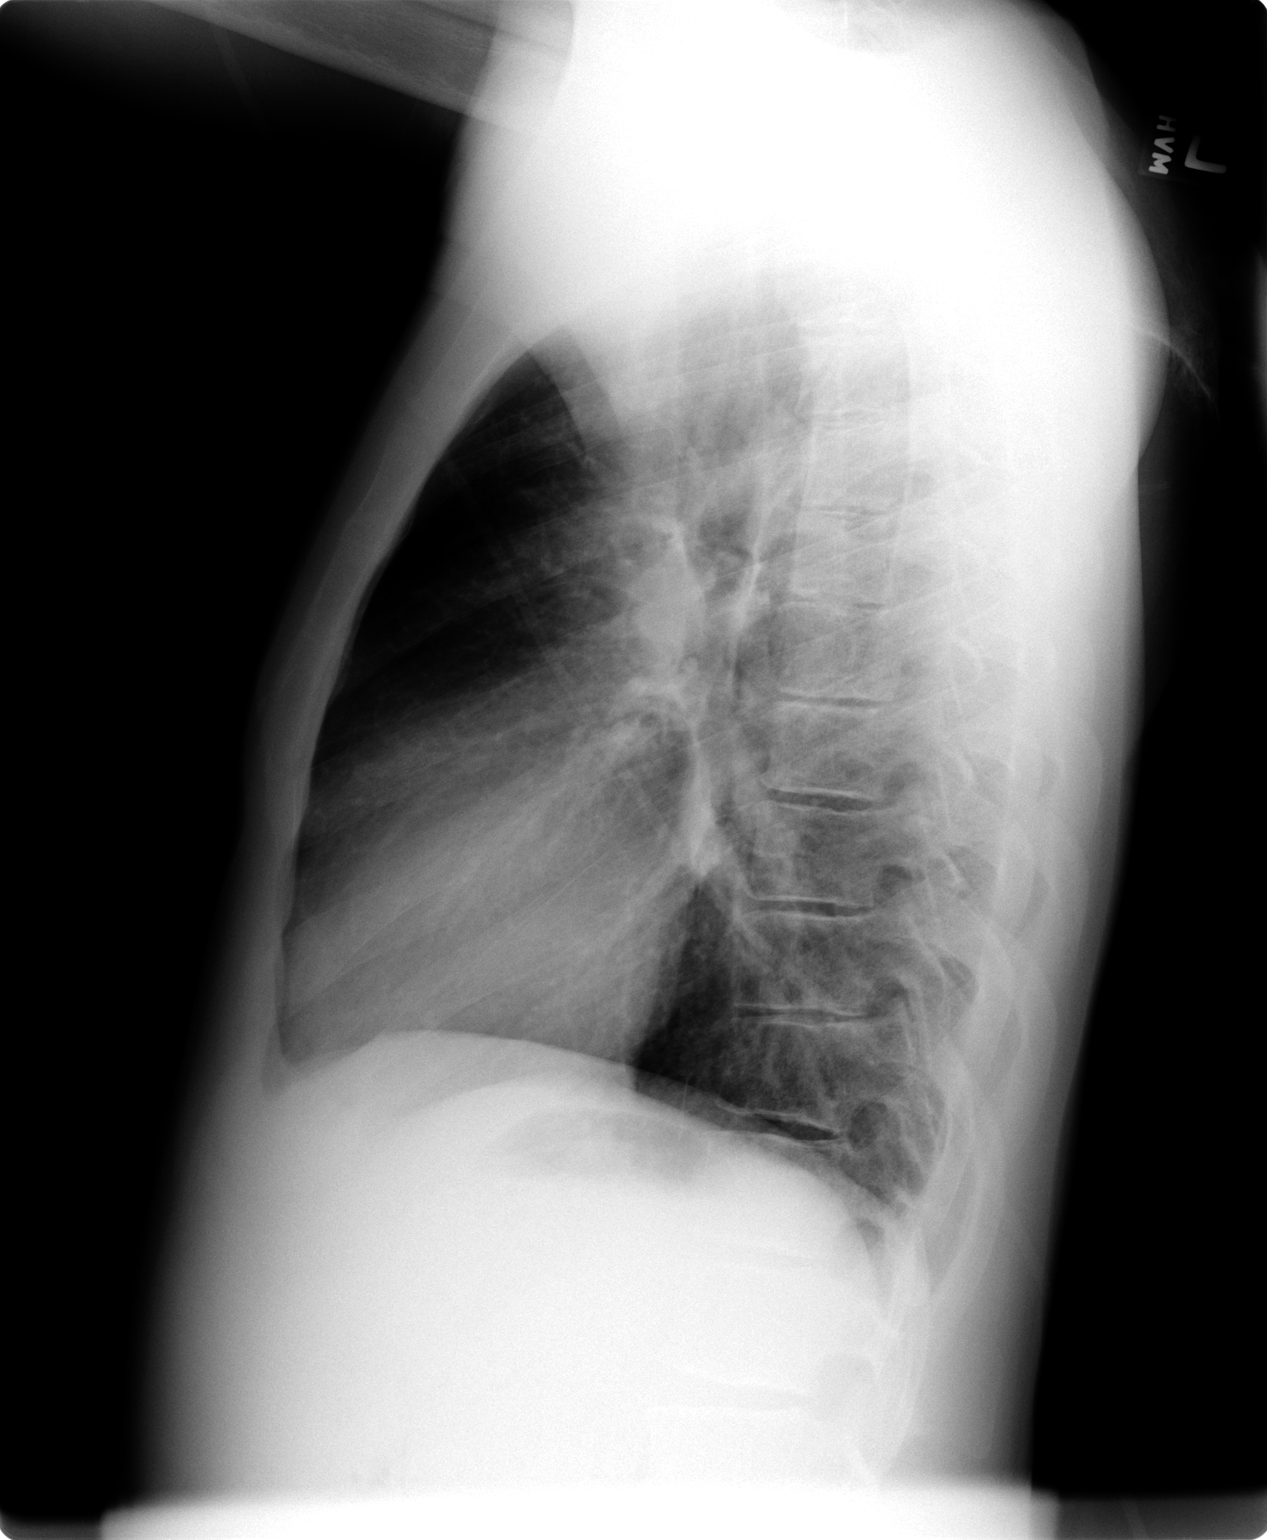

[2 of 2 positions shown; findings below may reference images not displayed]

Heart is normal in size.  Lungs are free of focal consolidations and pleural effusions.  Perihilar bronchitic changes are stable in appearance.
IMPRESSION: 1.  Bronchitic changes.
 2.  No focal pulmonary abnormality.

## 2007-10-20 IMAGING — CT CT ABDOMEN W/O CM
1 of 3 series · 15 of 32 positions shown, 19 images · IV contrast (agent unspecified)
Comparison: None. 
Contrast:  IV contrast was not given due to sickle cell crisis.

CLINICAL DATA: Sickle cell crisis.  Abdominal pain.  Rule out splenic infarct.
ABDOMEN CT WITHOUT CONTRAST:
TECHNIQUE: Multidetector CT imaging of the abdomen was performed following the standard protocol without IV contrast.
TECHNIQUE: Multidetector CT imaging of the pelvis was performed following the standard protocol without IV contrast.

[Series 4: abd_pel 2.0 b40f st · axial · 0.65mm/px · z∈[-446,-32]mm · 15 of 666 slices shown, 19 images]
[im 50/666  soft-tissue]
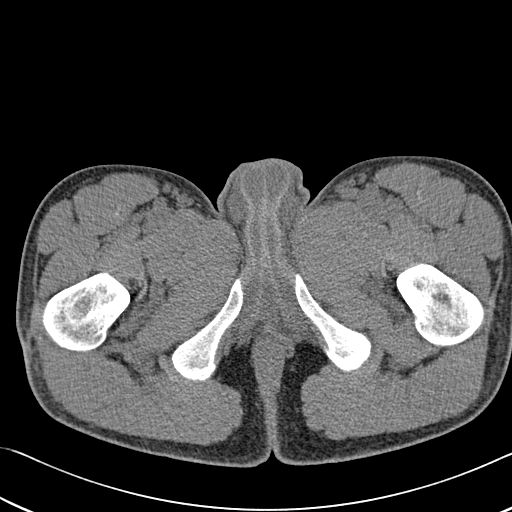
[im 50/666  bone]
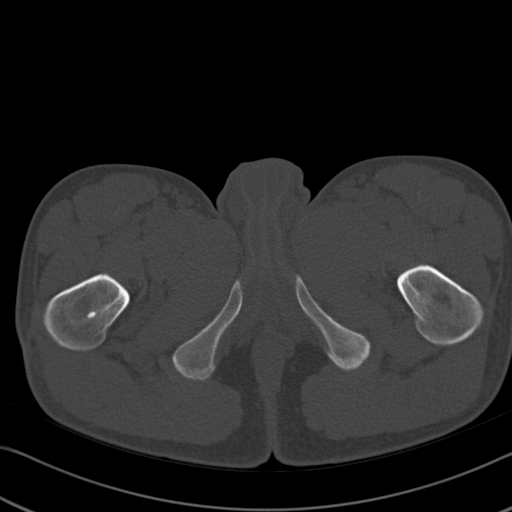
[im 99/666  soft-tissue]
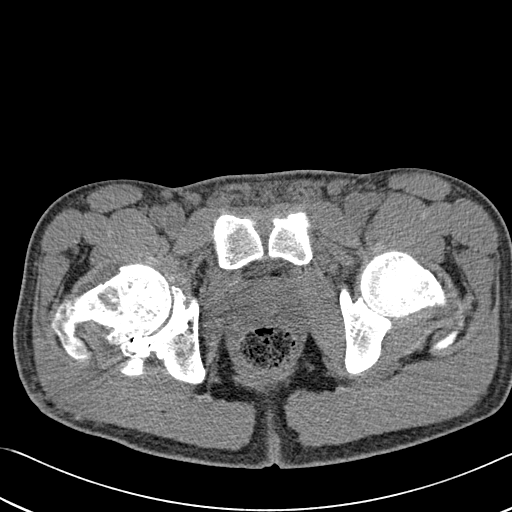
[im 148/666  soft-tissue]
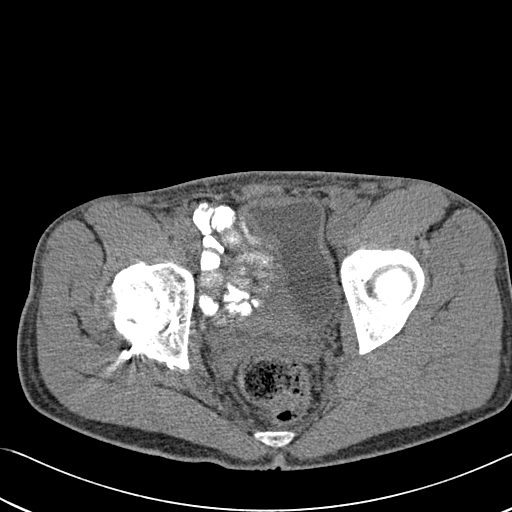
[im 198/666  soft-tissue]
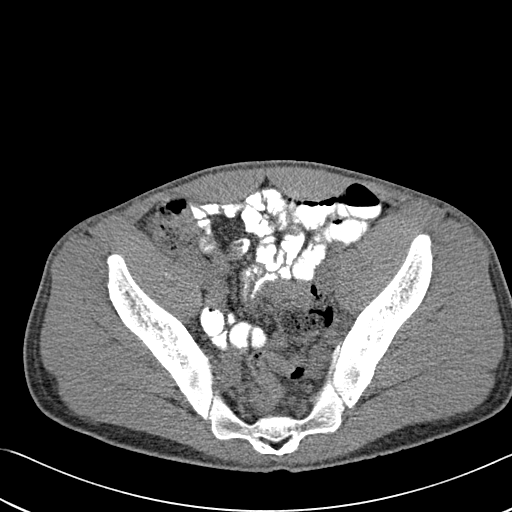
[im 247/666  soft-tissue]
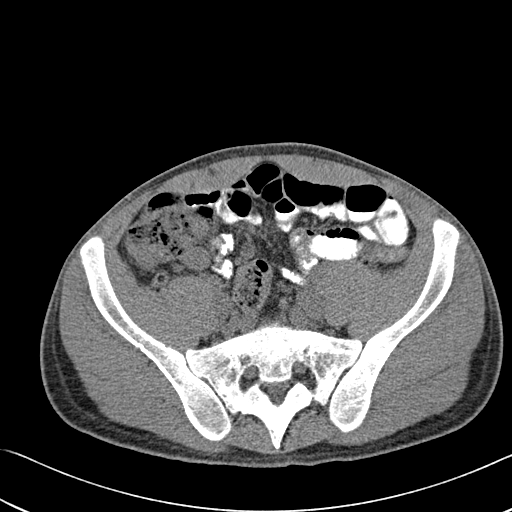
[im 296/666  soft-tissue]
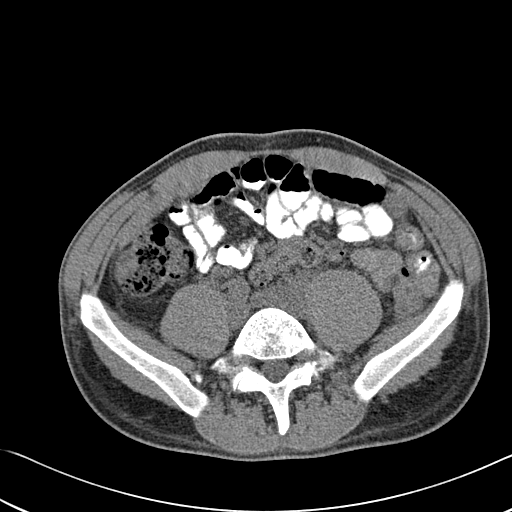
[im 345/666  soft-tissue]
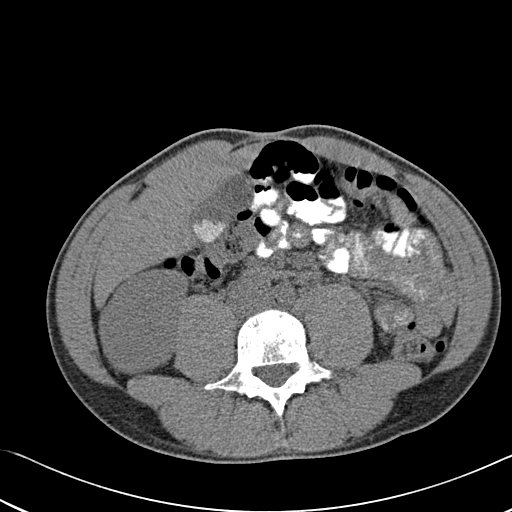
[im 395/666  soft-tissue]
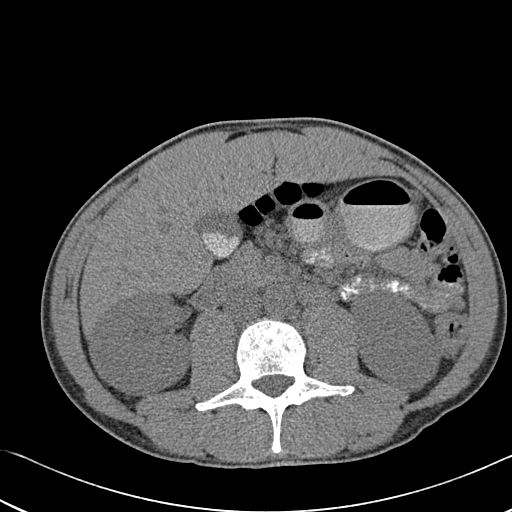
[im 444/666  soft-tissue]
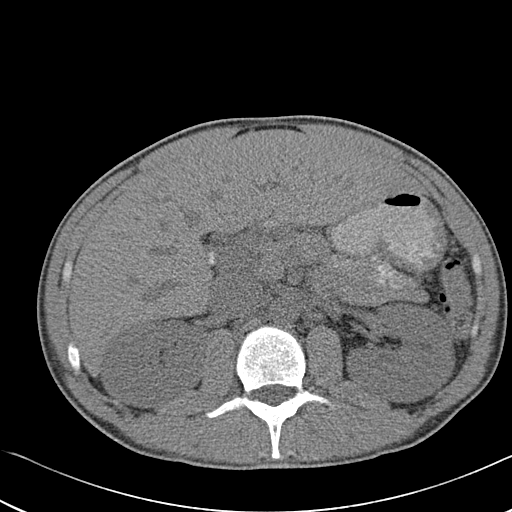
[im 444/666  bone]
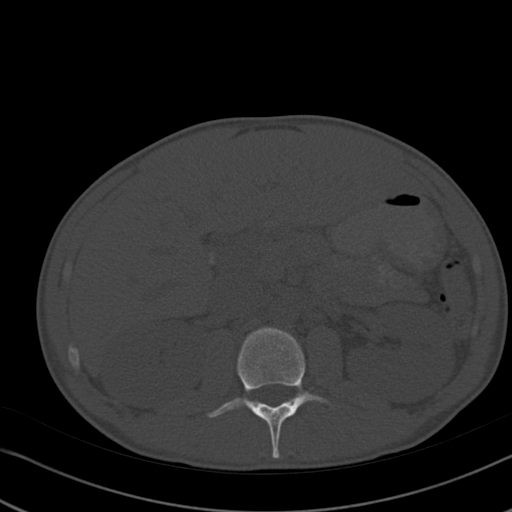
[im 493/666  soft-tissue]
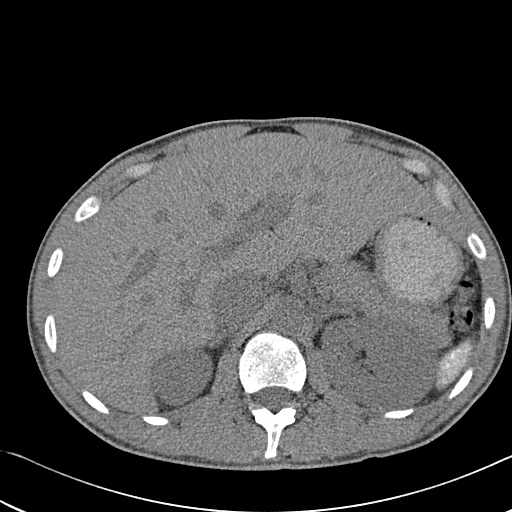
[im 542/666  soft-tissue]
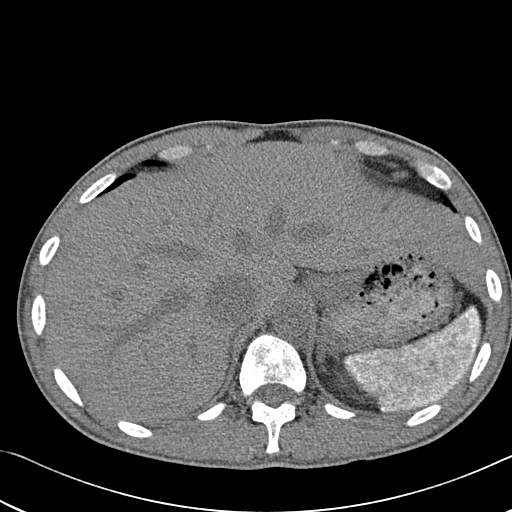
[im 567/666  lung]
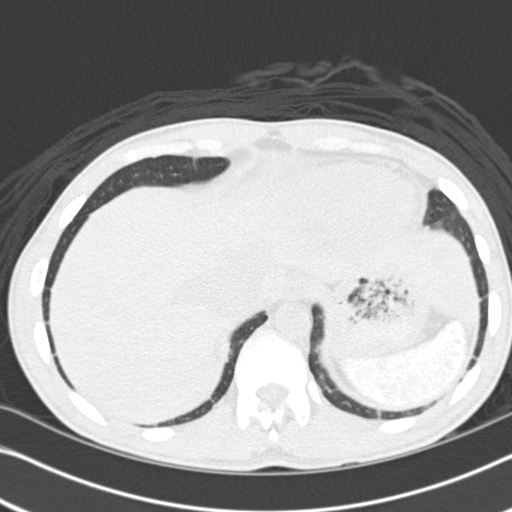
[im 592/666  soft-tissue]
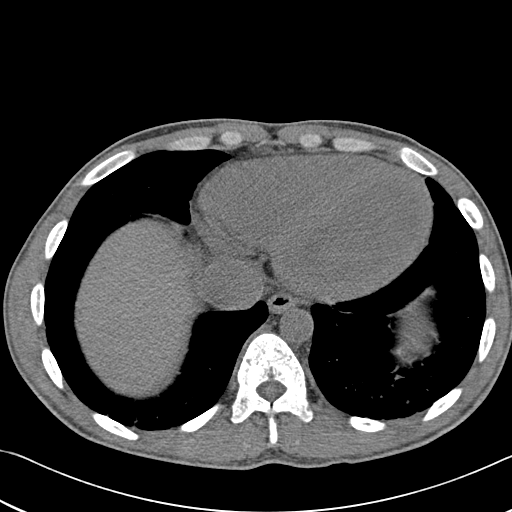
[im 592/666  lung]
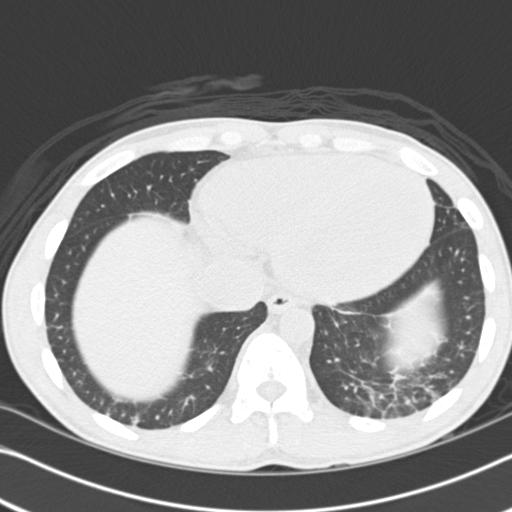
[im 616/666  lung]
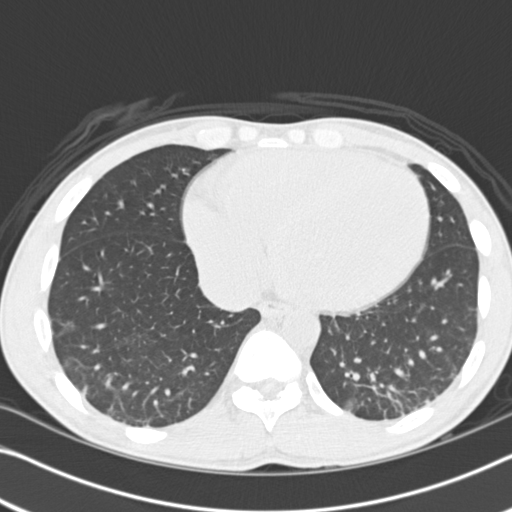
[im 641/666  soft-tissue]
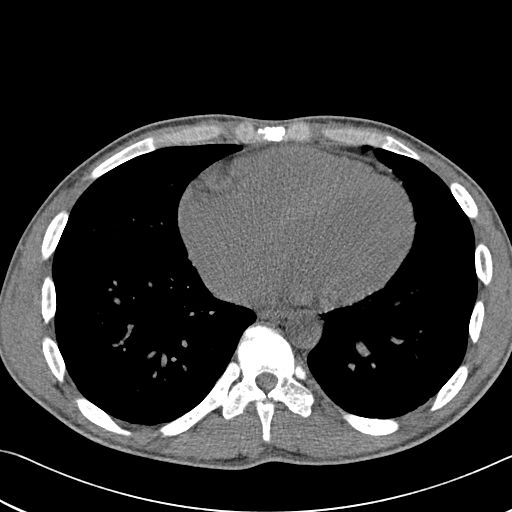
[im 641/666  lung]
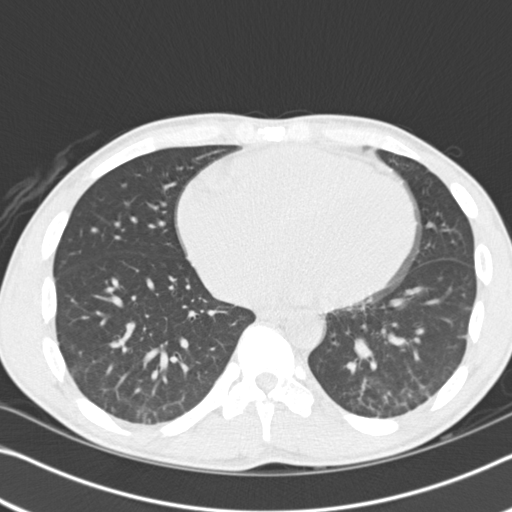

[15 of 32 positions shown; findings below may reference images not displayed]

FINDINGS: The heart is enlarged.  The spleen is hyperdense and may be partially calcified.  The spleen is normal in size and is mildly irregular.  No acute infarct is identified.  The liver is enlarged particularly in the left lobe which may be due to sickle cell disease or liver disease.  There are multiple gallstones present.  The kidneys are not obstructed.  There is no adenopathy.
IMPRESSION: 1.  Gallstones. 
2.  Hepatomegaly.
3.  No acute splenic infarct is identified.
PELVIS CT WITHOUT CONTRAST:
FINDINGS: There is no adenopathy or mass, and there is no free fluid.  The bowel is not dilated.  There are surgical screws in the posterior acetabulum on the right with secondary advanced degeneration in the right hip joint.
IMPRESSION: No acute abnormality.

## 2007-10-20 IMAGING — CR DG CHEST 2V
2 series · 2 of 2 positions shown · non-contrast
Comparison: 11/20/04.

CLINICAL DATA: Sickle cell crisis.  Pain in left lower lung region and upper abdomen. 
 CHEST - 2 VIEW:

[w chest pa]
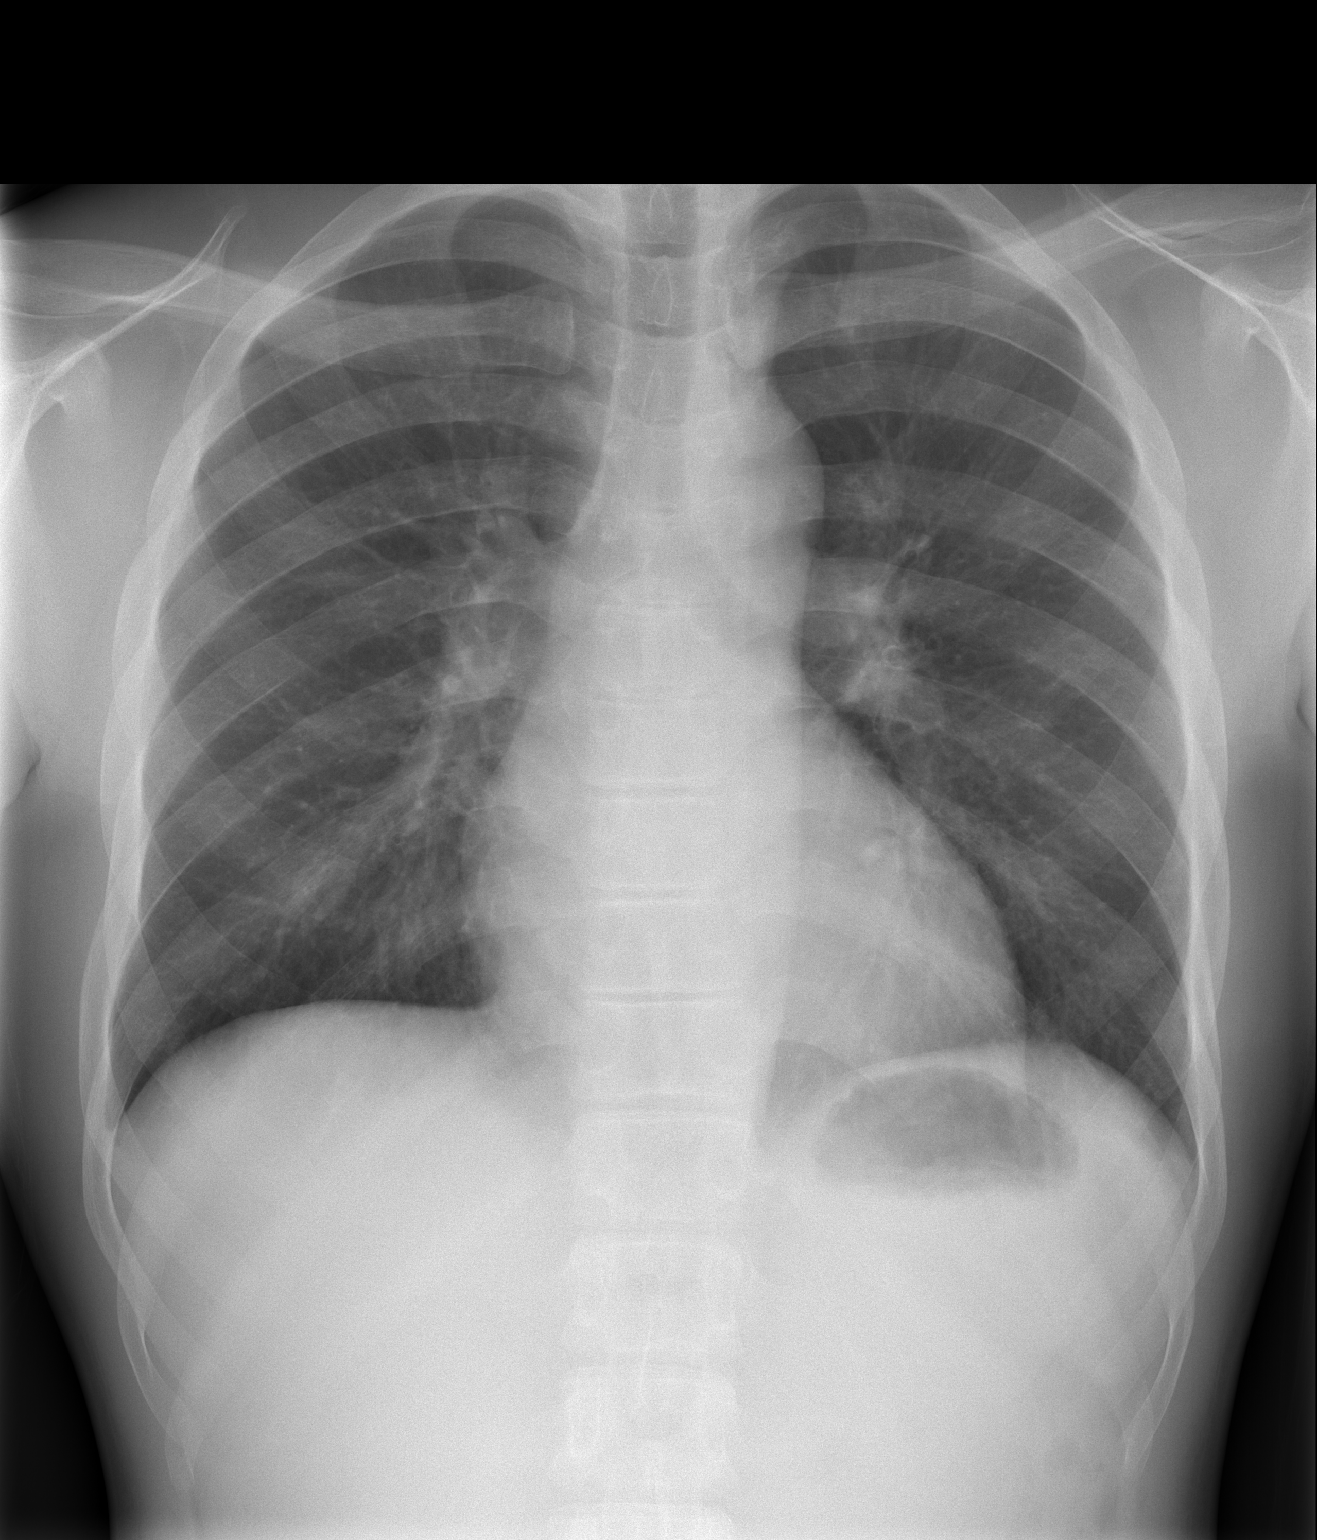

[w chest lat]
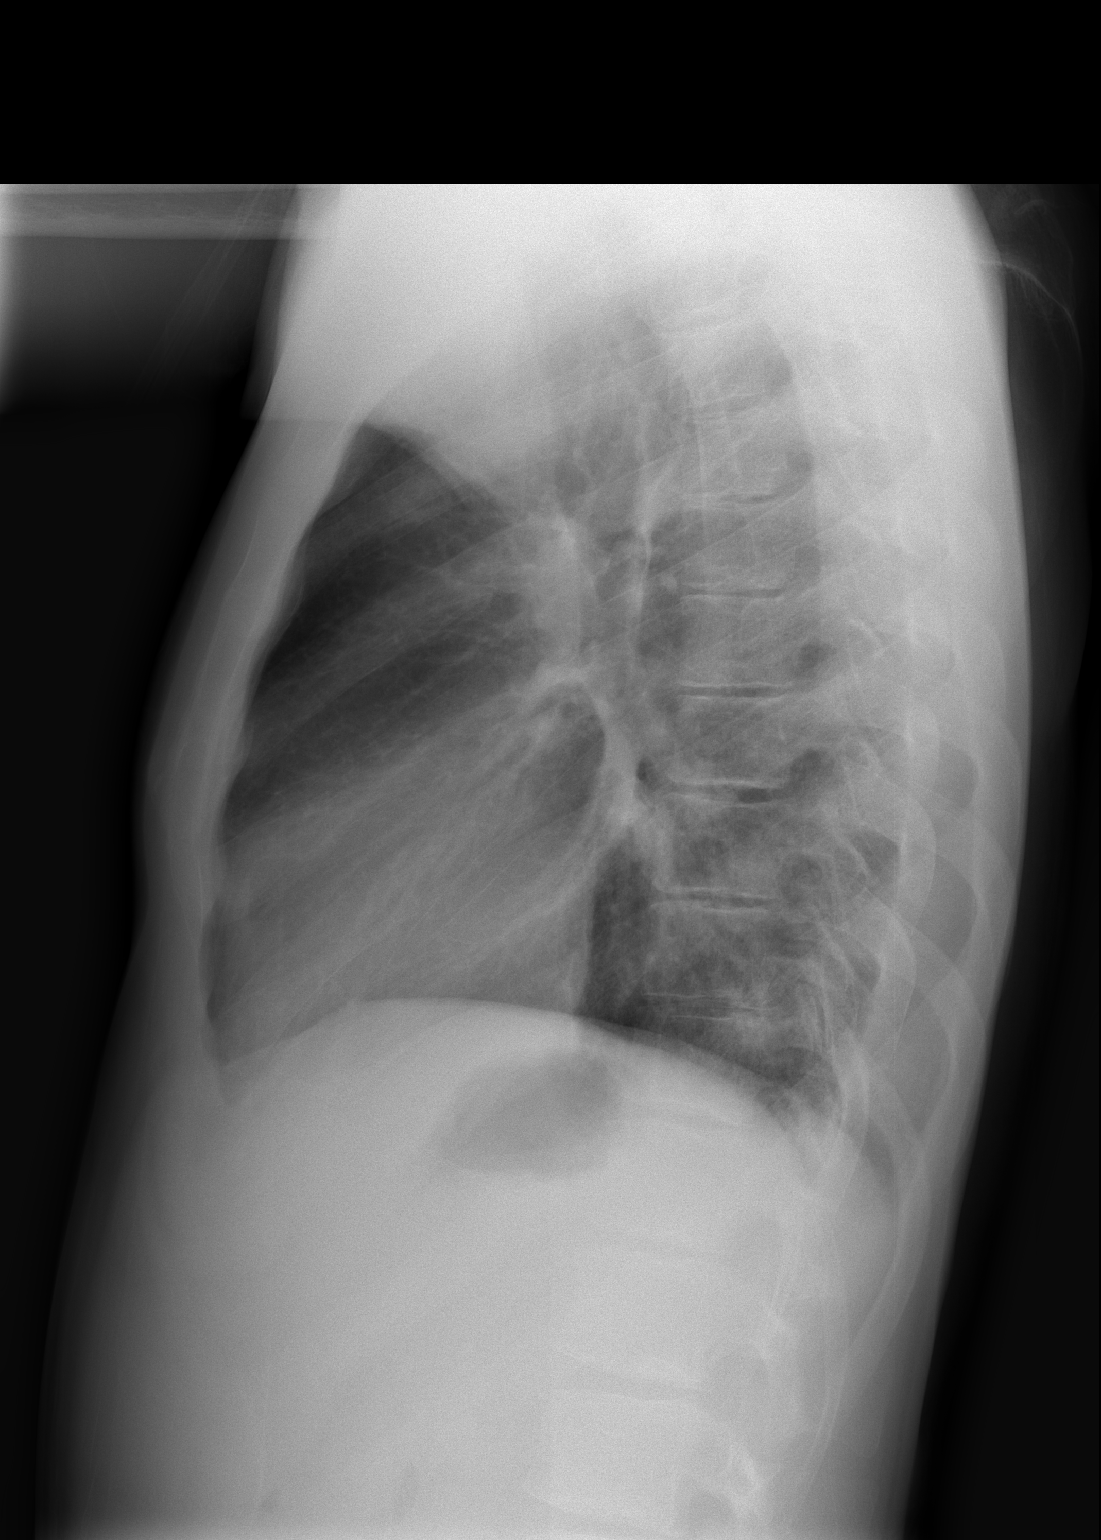

[2 of 2 positions shown; findings below may reference images not displayed]

FINDINGS: The heart size is mildly enlarged.  Skeletal changes of sickle cell disease are noted.  
 Mild bronchitic changes throughout both lungs with no focal infiltrate or pulmonary edema.
IMPRESSION: Mild bronchitic changes.   No focal infiltrate.

## 2007-10-21 IMAGING — CR DG CHEST 2V
2 series · 2 of 2 positions shown · non-contrast
Comparison: 11/26/04.

CLINICAL DATA: Questionable gallstones.  Pain.
 CHEST - 2 VIEW:

[view not recorded (1 of 2)]
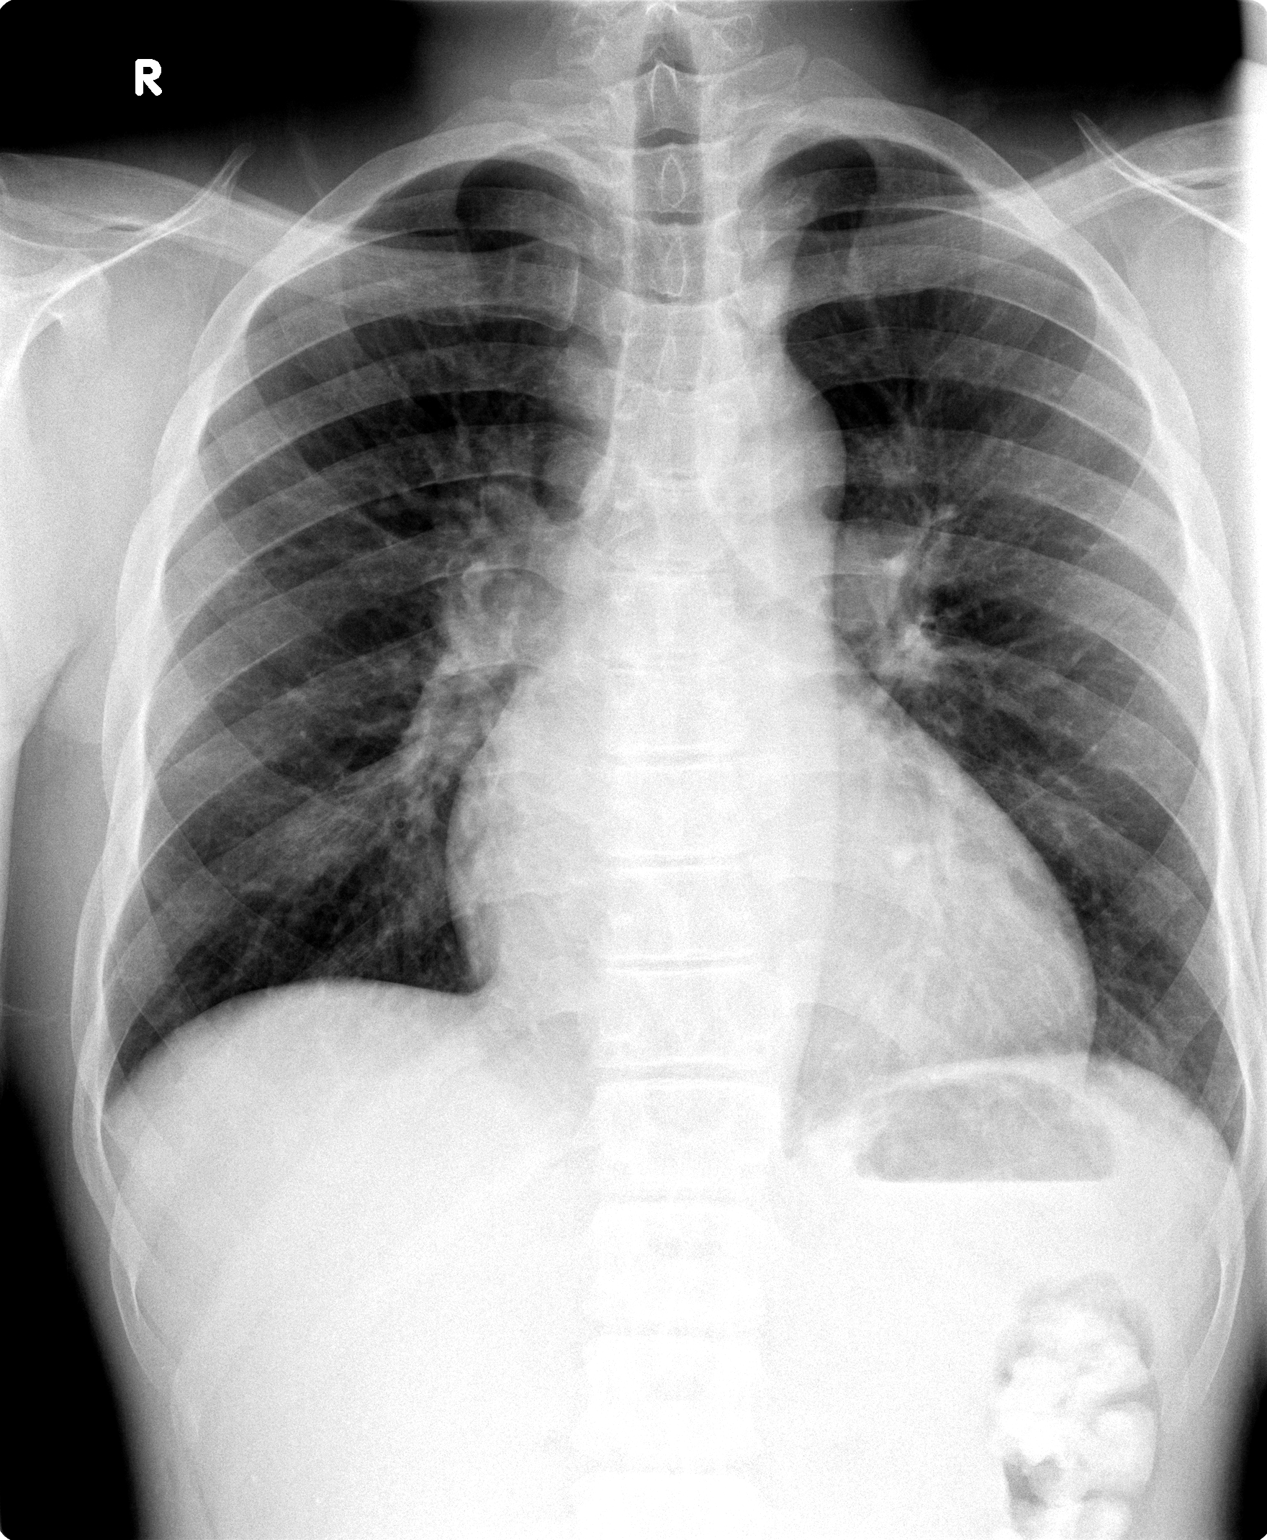

[view not recorded (2 of 2)]
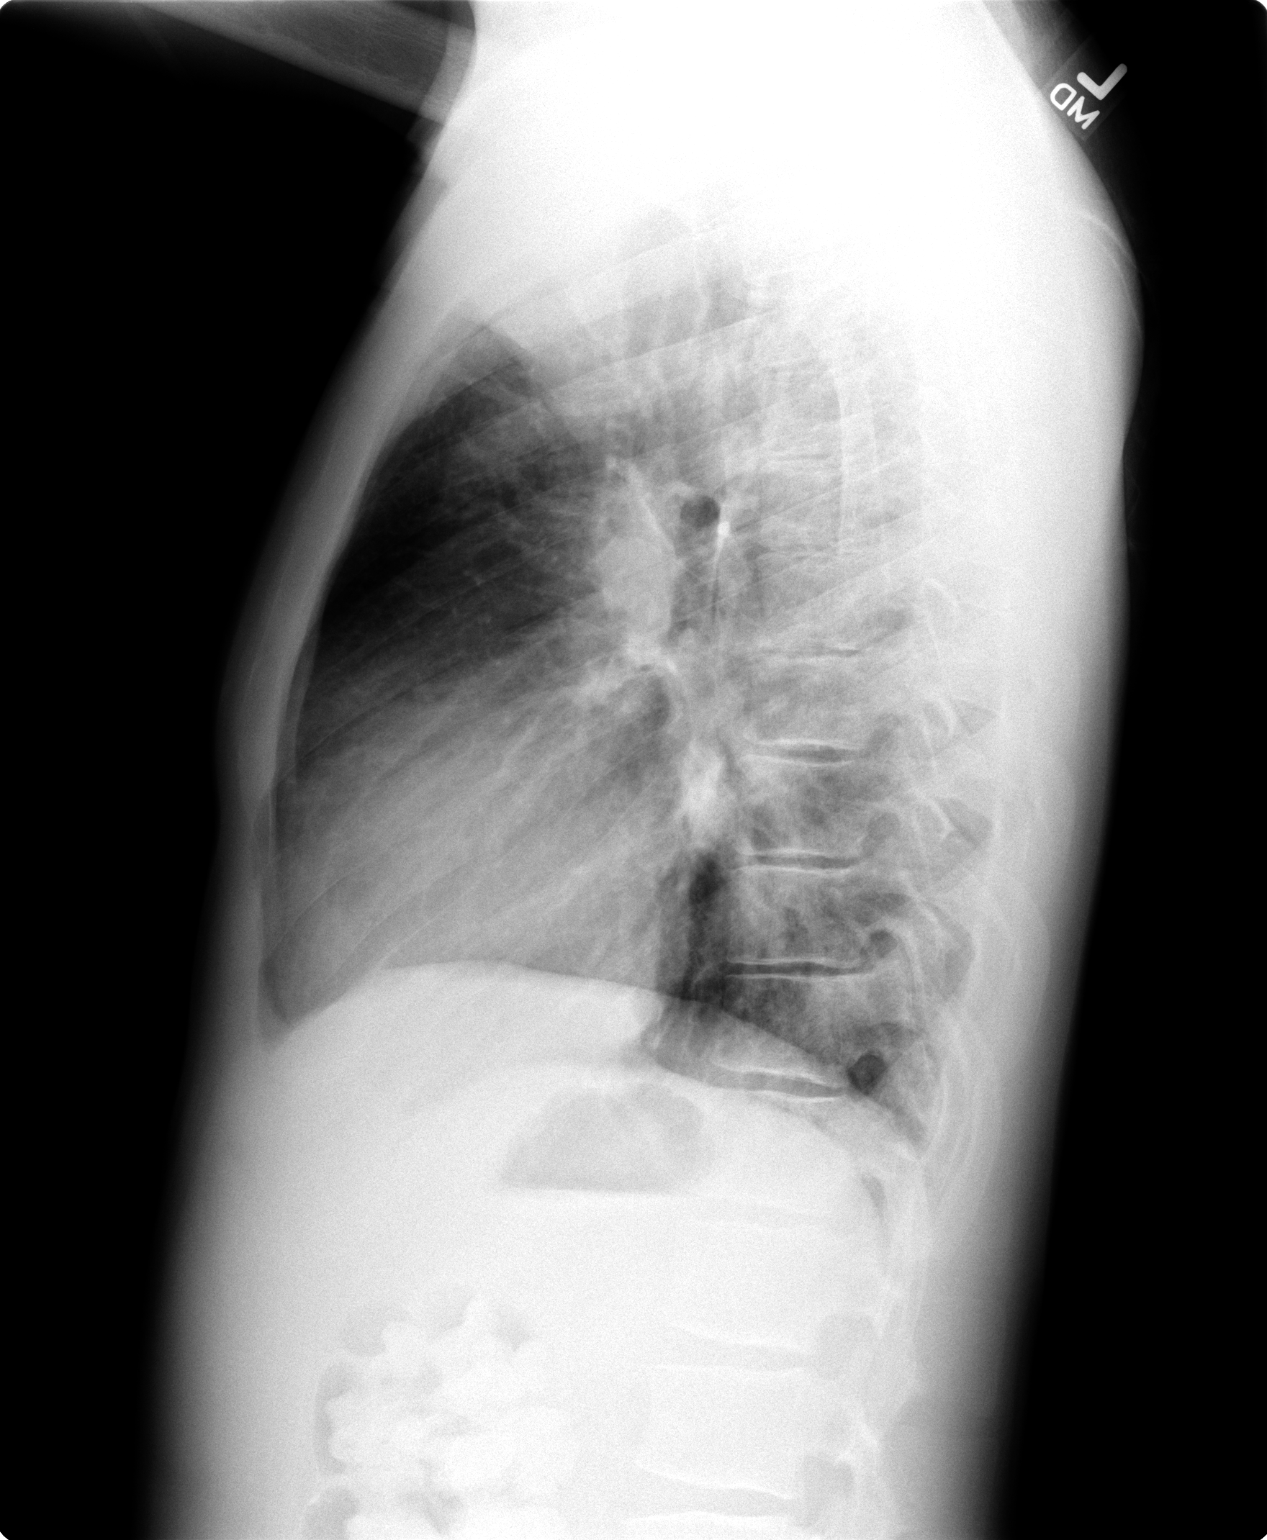

[2 of 2 positions shown; findings below may reference images not displayed]

FINDINGS: The heart is slightly enlarged.  Mild pulmonary vascular prominence.  No segmental infiltrate.  Mild peribronchial thickening, unchanged.  It is possible this represents mild bronchitis type changes.
IMPRESSION: 1.  Mild peribronchial thickening, possibly related to mild bronchitis type changes.
 2.  Mild cardiomegaly and pulmonary vascular prominence.
 3.  No segmental region of consolidation.

## 2007-10-23 IMAGING — US US ABDOMEN COMPLETE
1 series · 14 of 25 positions shown · non-contrast
Comparison: CT from 11/26/04.

CLINICAL DATA: Pain. Sickle cell disease.  Basilar occlusive crisis.
 ABDOMEN ULTRASOUND:
TECHNIQUE: Complete abdominal ultrasound examination was performed including evaluation of the liver, gallbladder, bile ducts, pancreas, kidneys, spleen, IVC, and abdominal aorta.

[Series 1: unknown · 0.30mm/px · 14 of 85 slices shown]
[im 1/85]
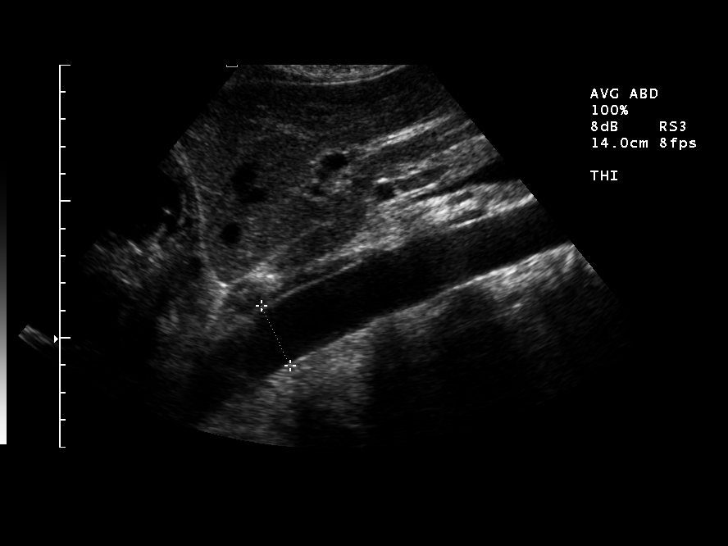
[im 8/85]
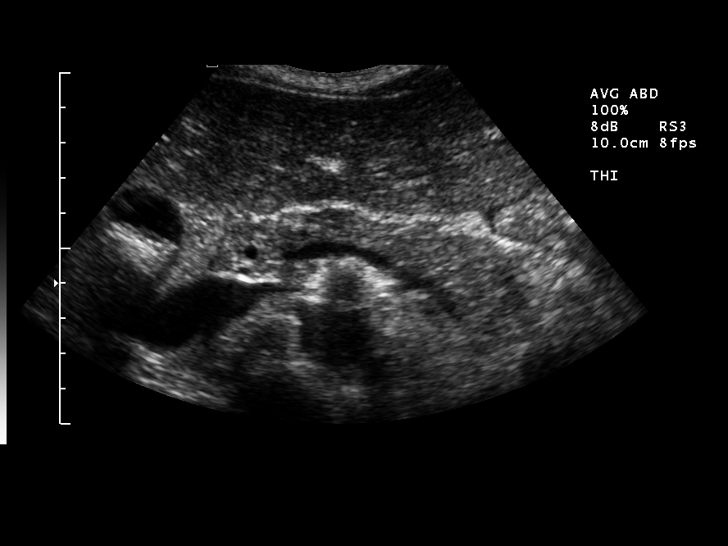
[im 15/85]
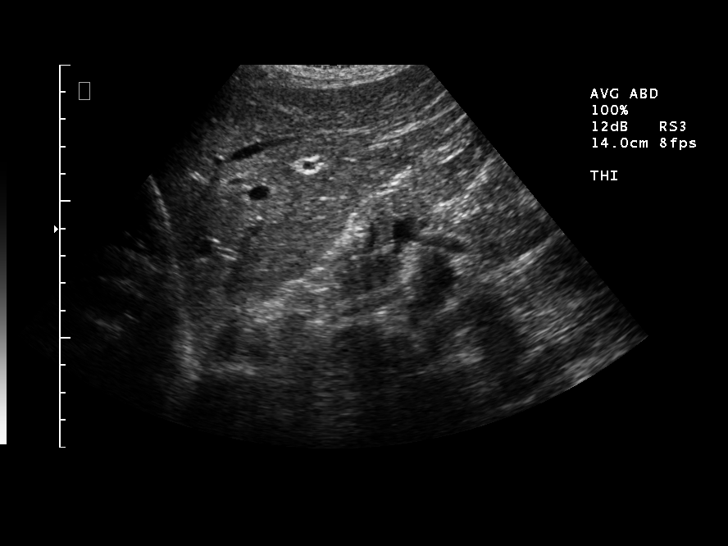
[im 22/85]
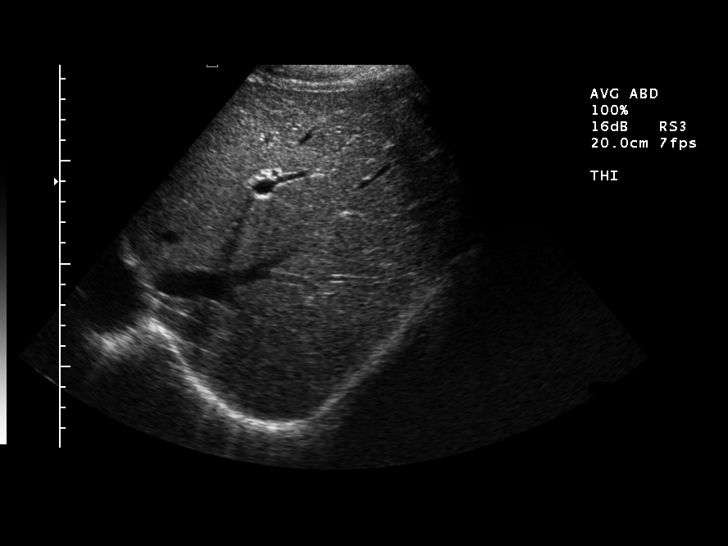
[im 29/85]
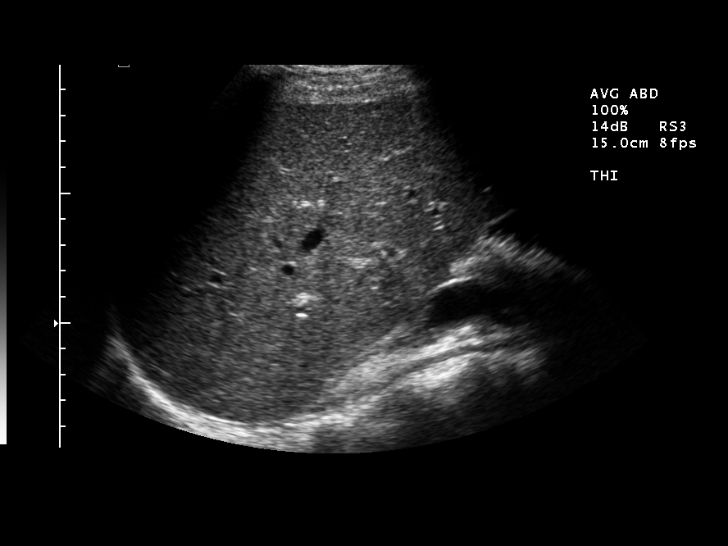
[im 32/85]
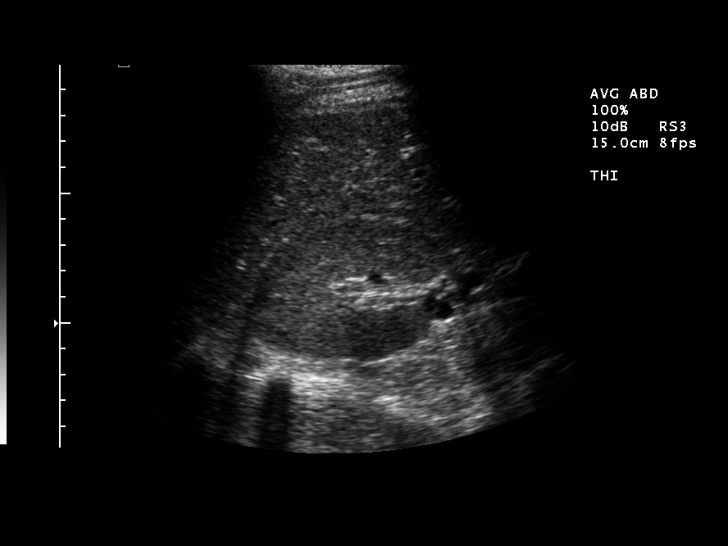
[im 39/85]
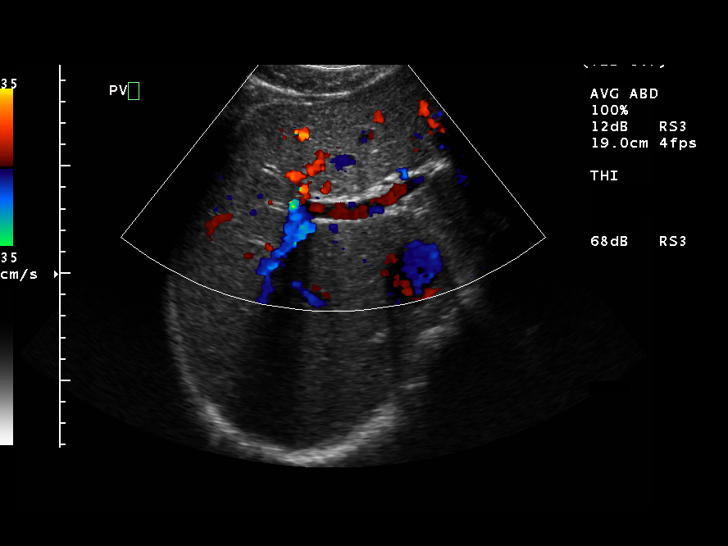
[im 46/85]
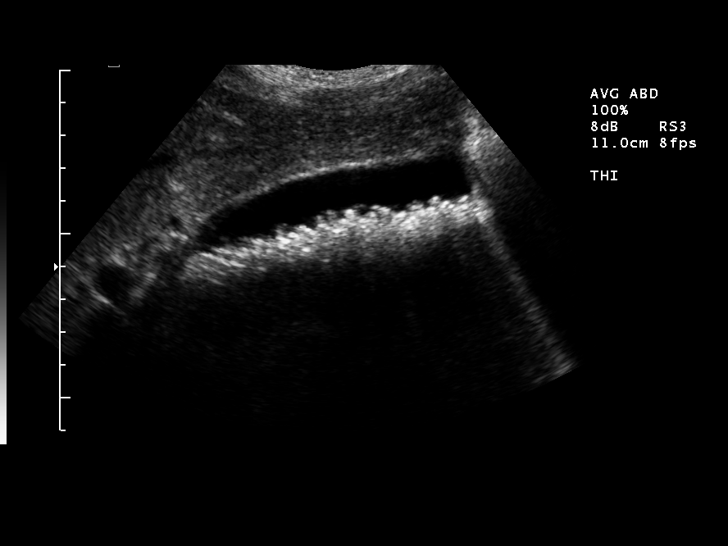
[im 53/85]
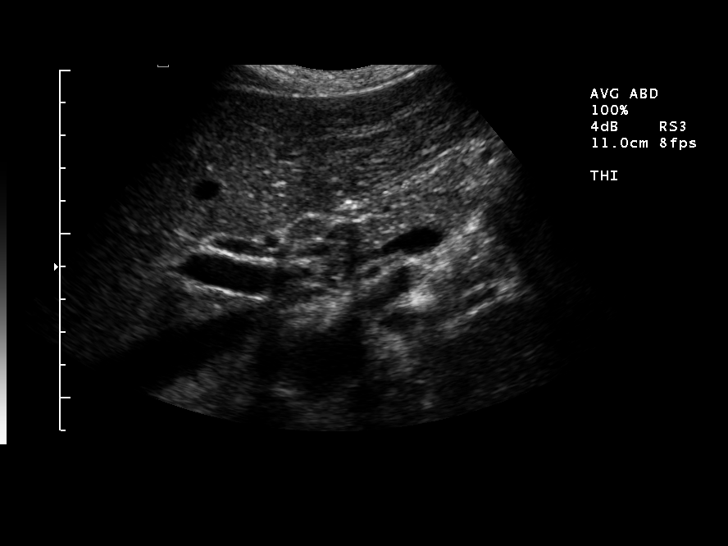
[im 57/85]
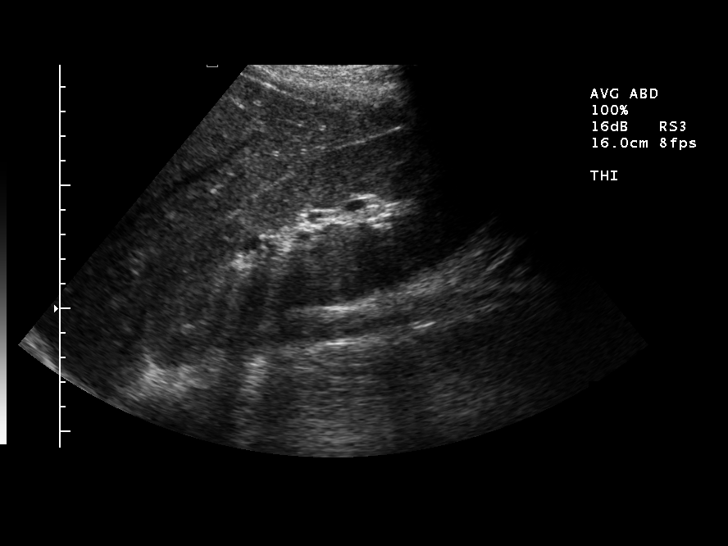
[im 64/85]
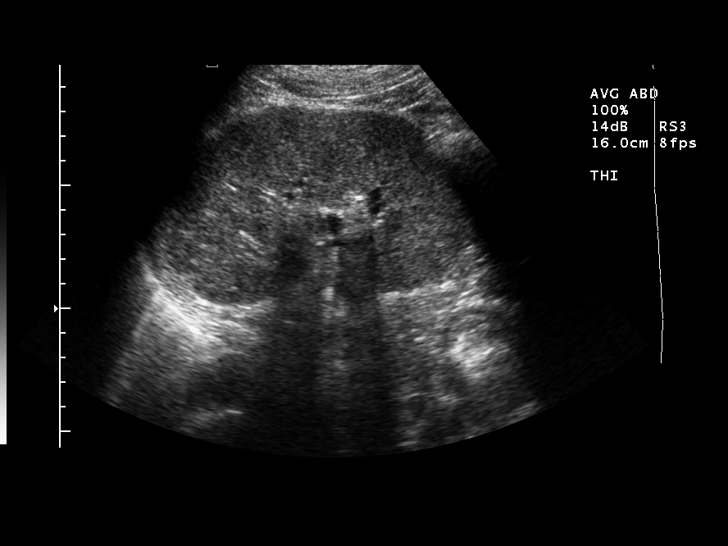
[im 71/85]
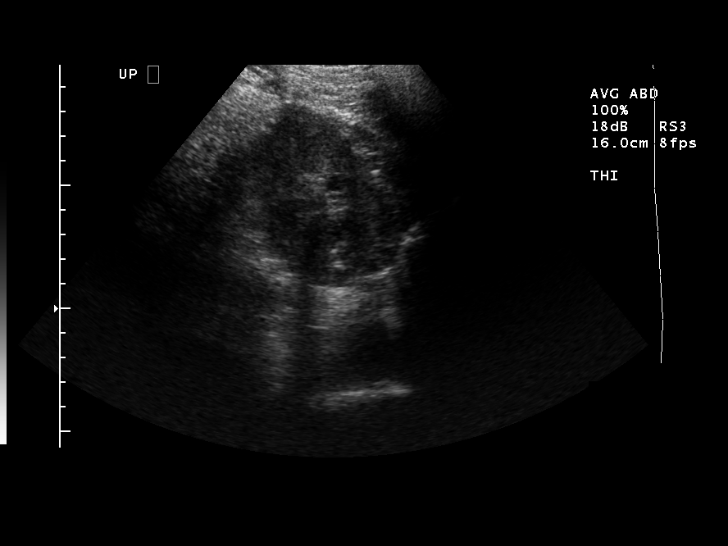
[im 78/85]
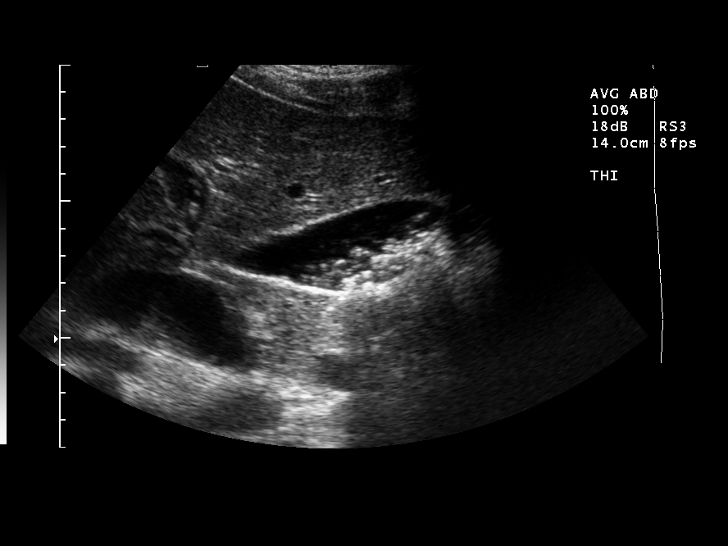
[im 85/85]
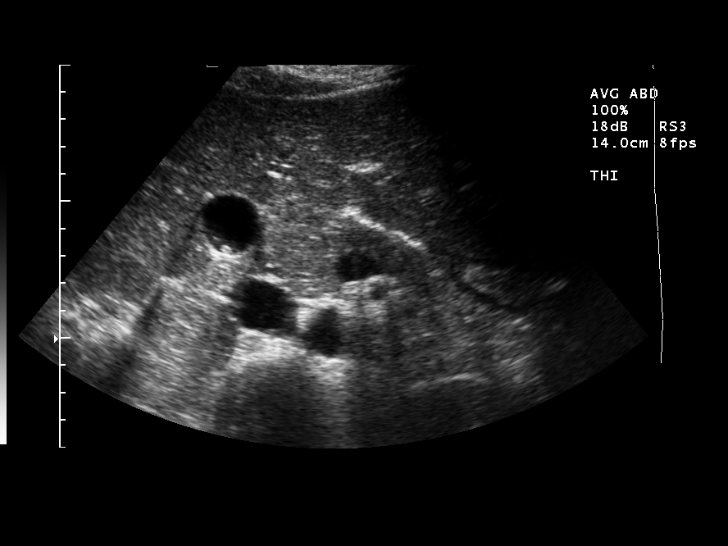

[14 of 25 positions shown; findings below may reference images not displayed]

FINDINGS: Gallbladder:  Gallbladder lumen shows numerous tiny echogenic stones.  No evidence for gallbladder wall thickening or pericholecystic fluid. The sonographer reports no sonographic Murphy?s sign.  Gallbladder wall thickness is measured at 2 mm. 
 Common bile duct:  Nondilated at 5 mm diameter. 
 Liver:  15 cm craniocaudal length is upper normal.  
 IVC:  Normal.
 Pancreas:  Normal.
 Spleen:  Diminutive and hyperechoic.  The spleen is seen to be diffusely calcified on the recent CT scan.
 Kidneys:  Unremarkable.
 Abdominal aorta:  No aneurysm.
IMPRESSION: 1.  Cholelithiasis without gallbladder wall thickening or pericholecystic fluid.  There is no biliary dilatation.
 2.  Small echogenic spleen in this patient with a history of sickle cell.

## 2007-11-04 IMAGING — CR DG CHEST 2V
2 series · 2 of 2 positions shown · non-contrast
Comparison: Two view chest x-ray 11/27/2004.

CLINICAL DATA: History of sickle cell disease, presenting with bilateral chest
pain

CHEST - 2 VIEW  12/11/2004:

[view not recorded (1 of 2)]
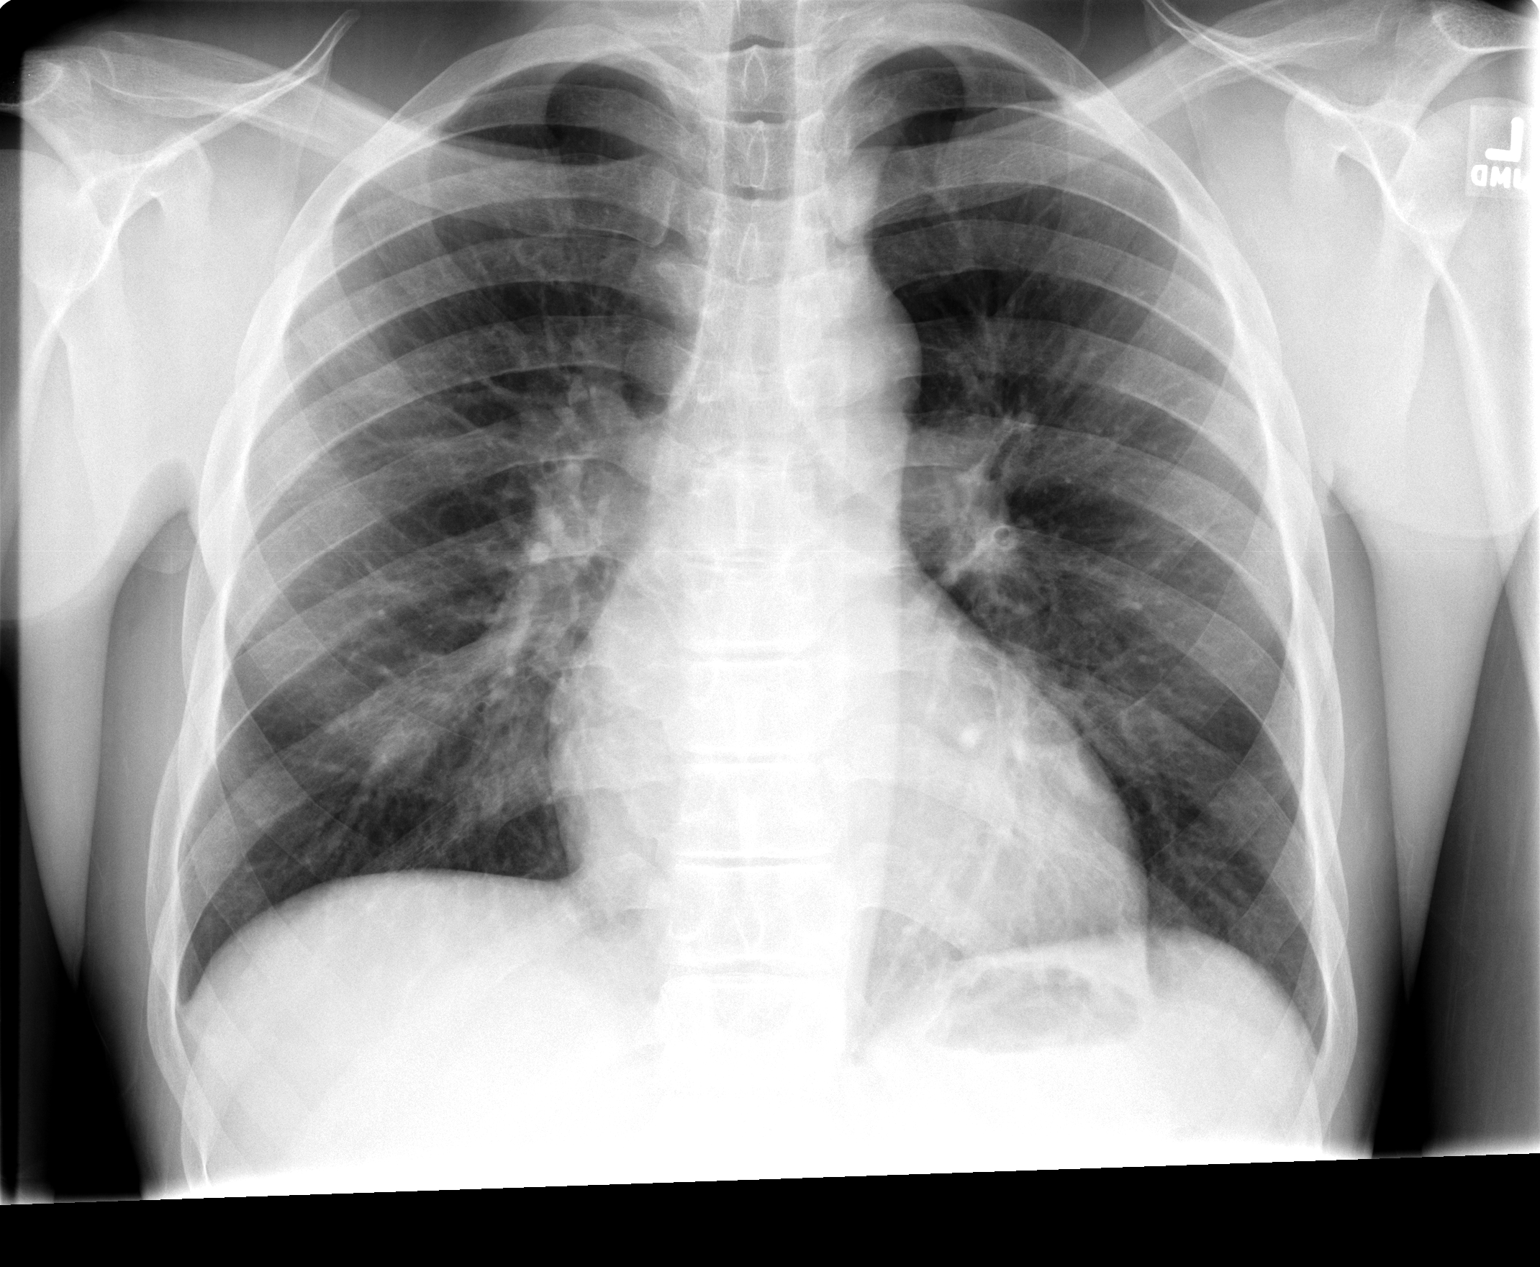

[view not recorded (2 of 2)]
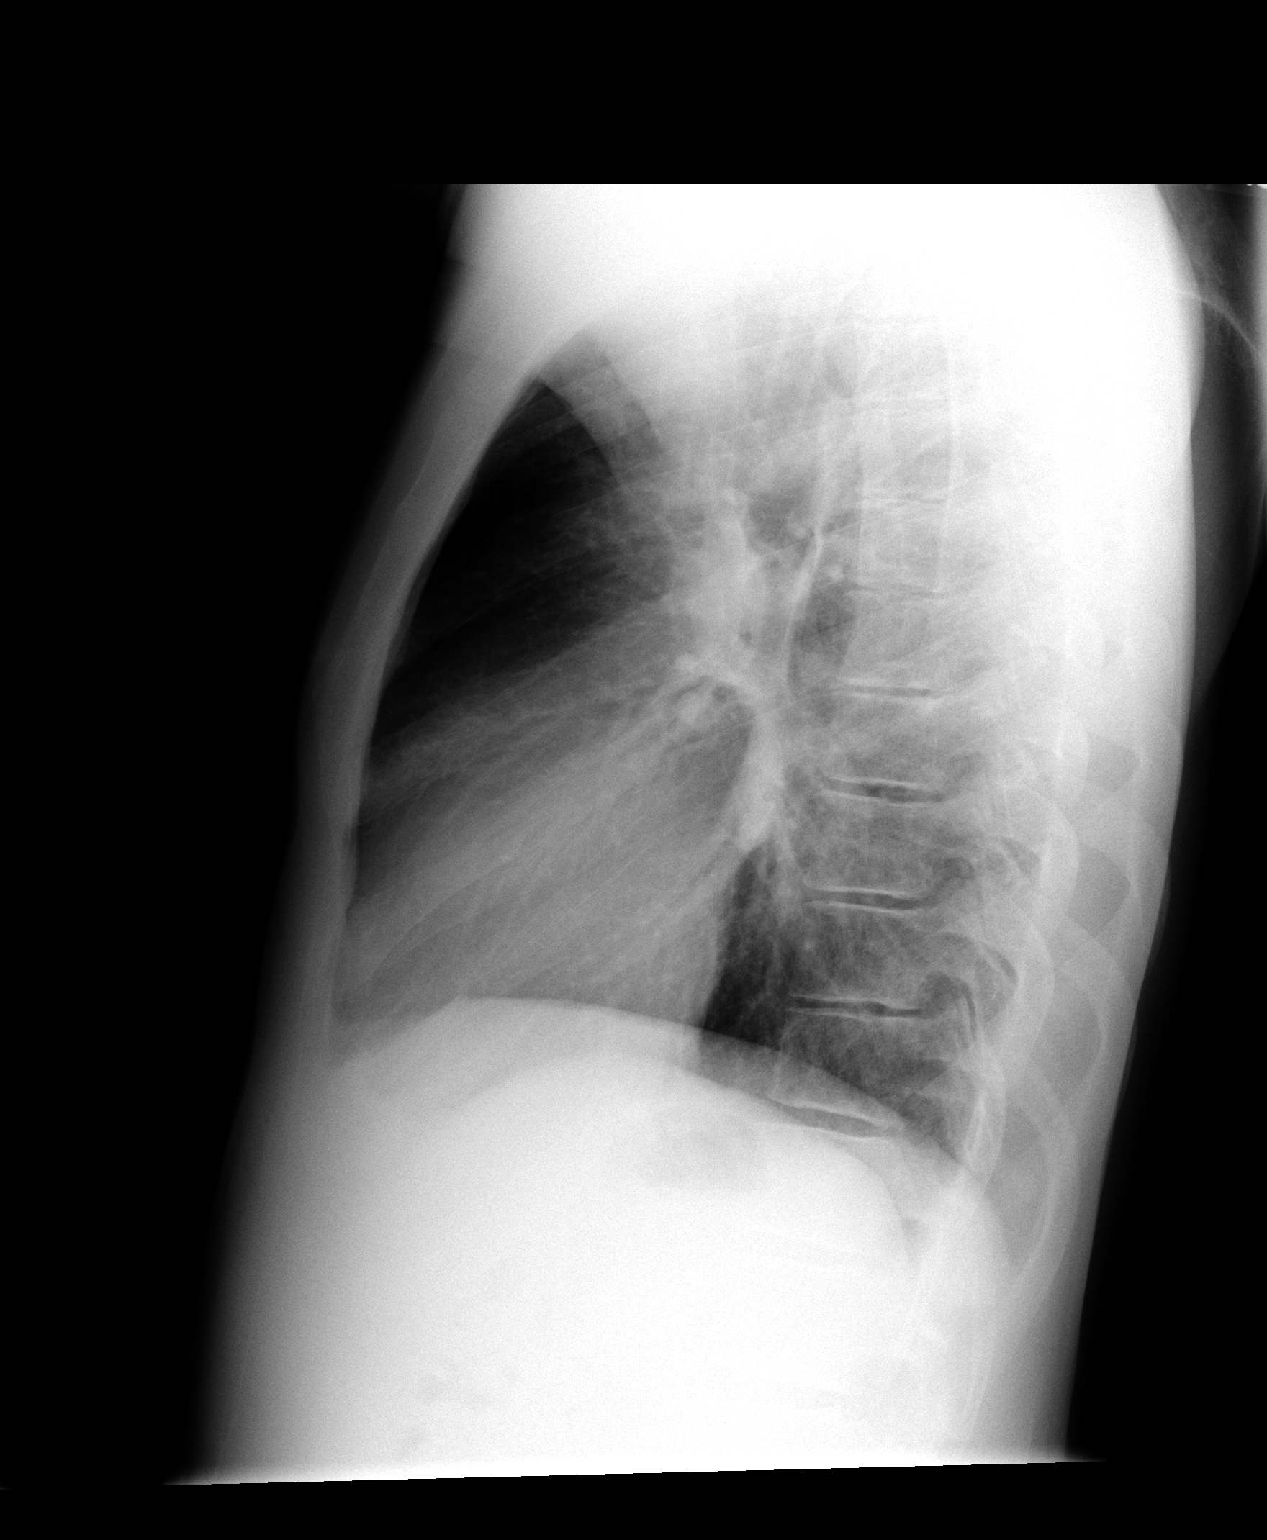

[2 of 2 positions shown; findings below may reference images not displayed]

FINDINGS: The heart size is upper normal and if anything, has decreased
slightly in size in the interval. Prominent central pulmonary arteries are
unchanged. Hilar and mediastinal contours are otherwise unremarkable. The lungs
appear clear. There are no pleural effusions. Degenerative changes are noted in
the thoracic spine.
IMPRESSION: Borderline heart size. No acute cardiopulmonary disease.

## 2007-11-12 ENCOUNTER — Encounter (INDEPENDENT_AMBULATORY_CARE_PROVIDER_SITE_OTHER): Payer: Self-pay | Admitting: Otolaryngology

## 2007-11-12 ENCOUNTER — Ambulatory Visit (HOSPITAL_BASED_OUTPATIENT_CLINIC_OR_DEPARTMENT_OTHER): Admission: RE | Admit: 2007-11-12 | Discharge: 2007-11-12 | Payer: Self-pay | Admitting: Otolaryngology

## 2007-11-14 ENCOUNTER — Inpatient Hospital Stay (HOSPITAL_COMMUNITY): Admission: EM | Admit: 2007-11-14 | Discharge: 2007-11-14 | Payer: Self-pay | Admitting: Emergency Medicine

## 2007-11-21 IMAGING — CR DG CHEST 2V
2 series · 2 of 2 positions shown · non-contrast
Comparison: 12/11/04.

CLINICAL DATA: 24-year-old male with sickle cell crisis.  Chest pain. 
 CHEST - 2 VIEW:

[w chest pa]
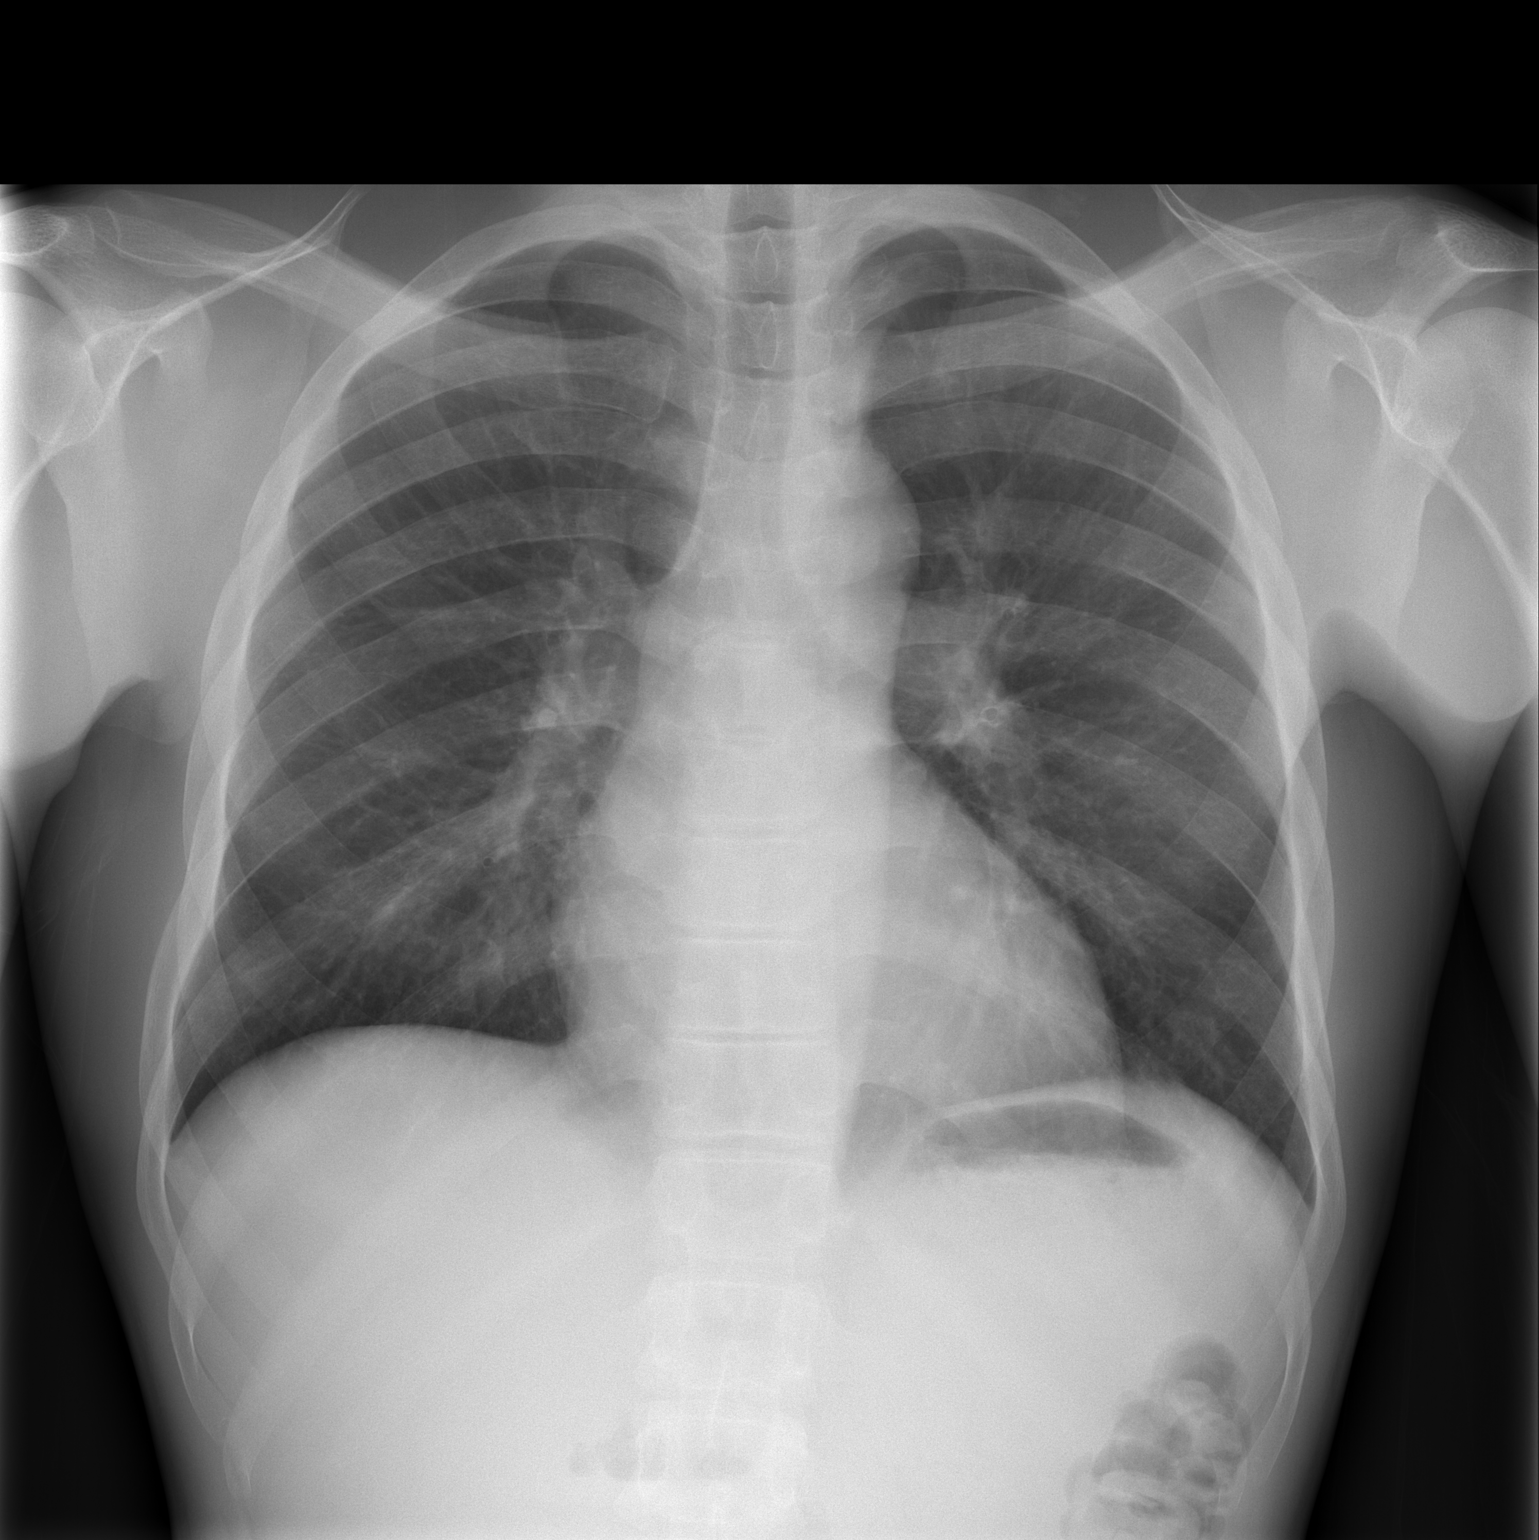

[w chest lat]
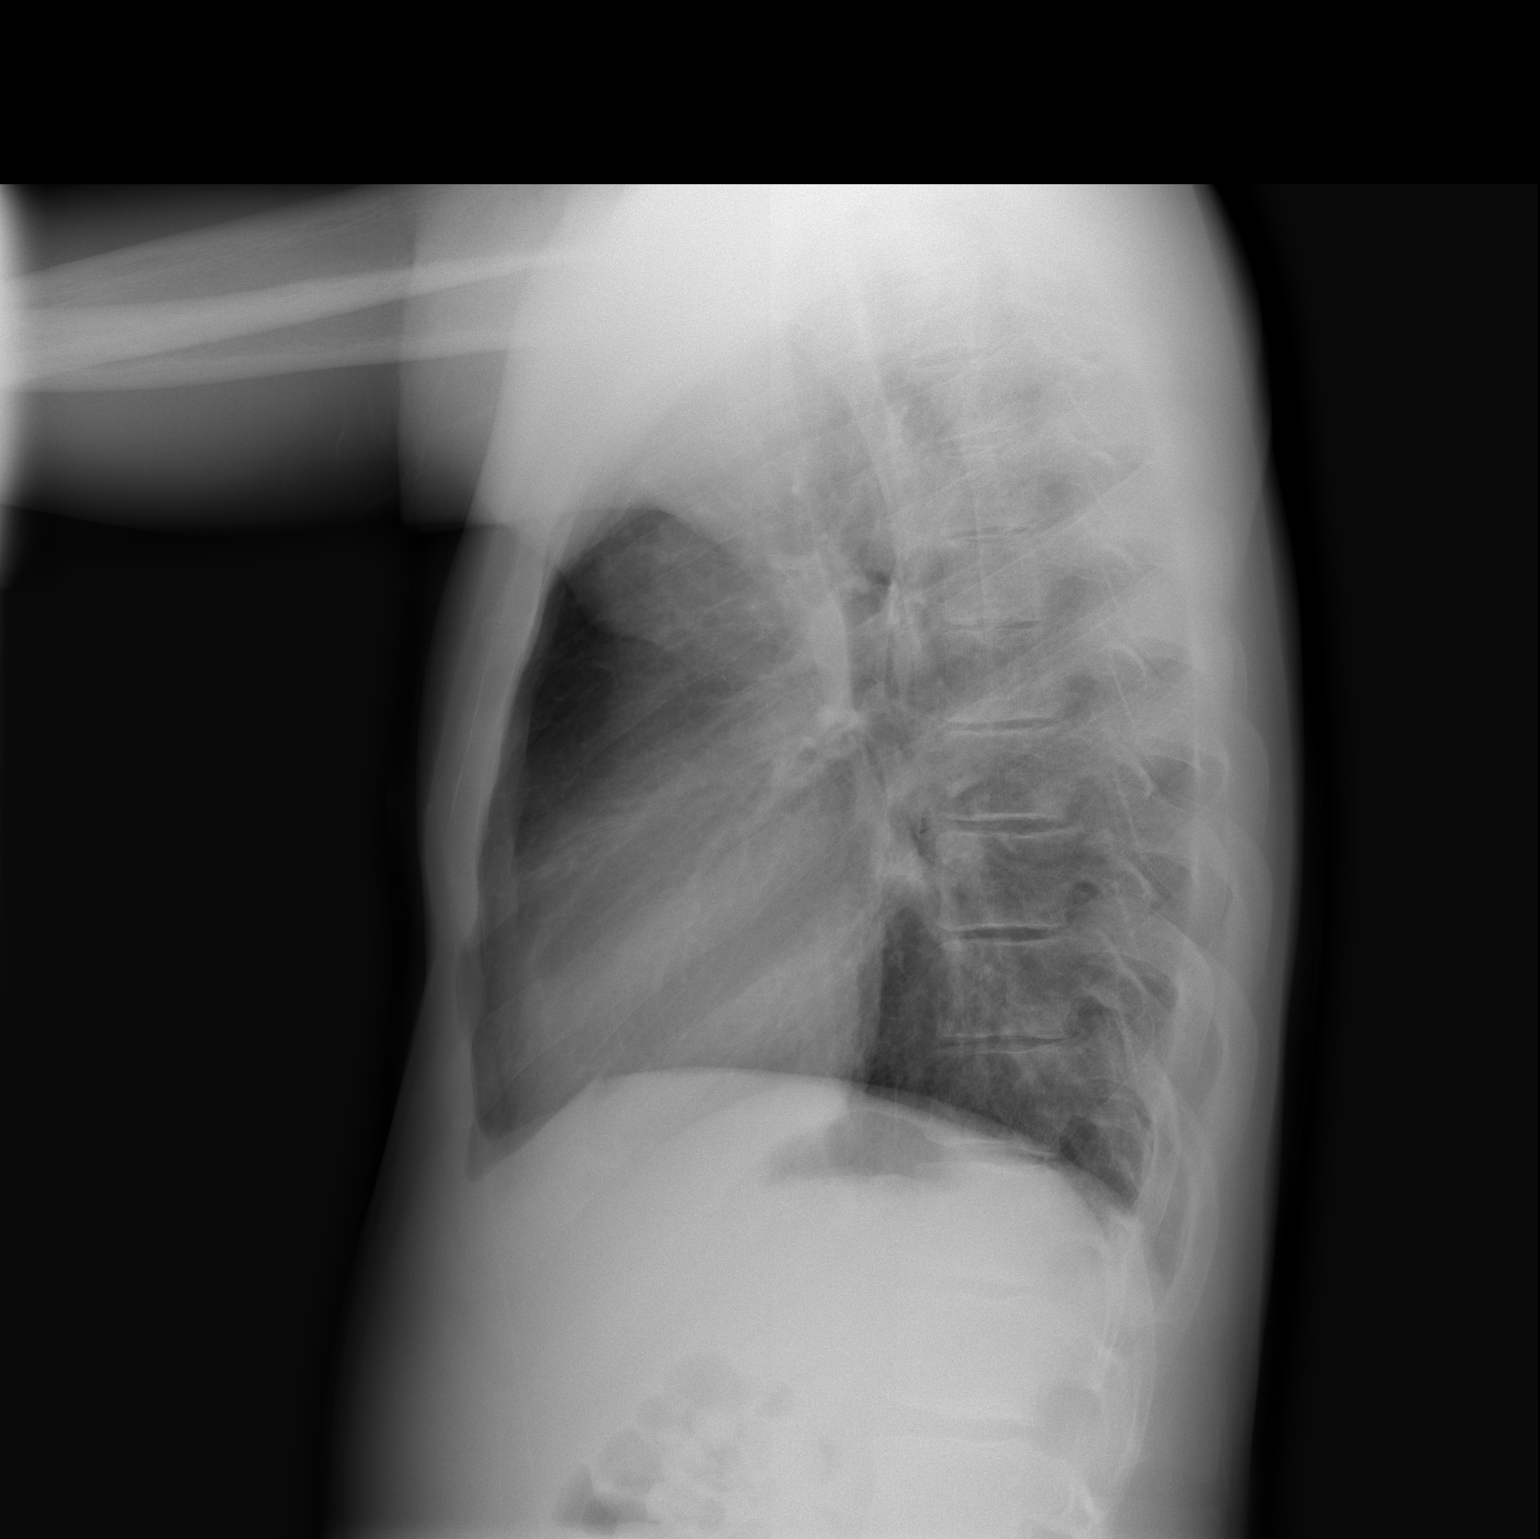

[2 of 2 positions shown; findings below may reference images not displayed]

FINDINGS: Borderline heart size without acute consolidation, pneumonia, edema, effusion, or pneumothorax.  Stable exam.
IMPRESSION: No acute finding.

## 2007-12-15 IMAGING — CR DG CHEST 2V
2 series · 2 of 2 positions shown · non-contrast
Comparison: 12/28/2004.

CLINICAL DATA: Chest pain. Sickle cell. 
 CHEST - 2 VIEW:

[view not recorded (1 of 2)]
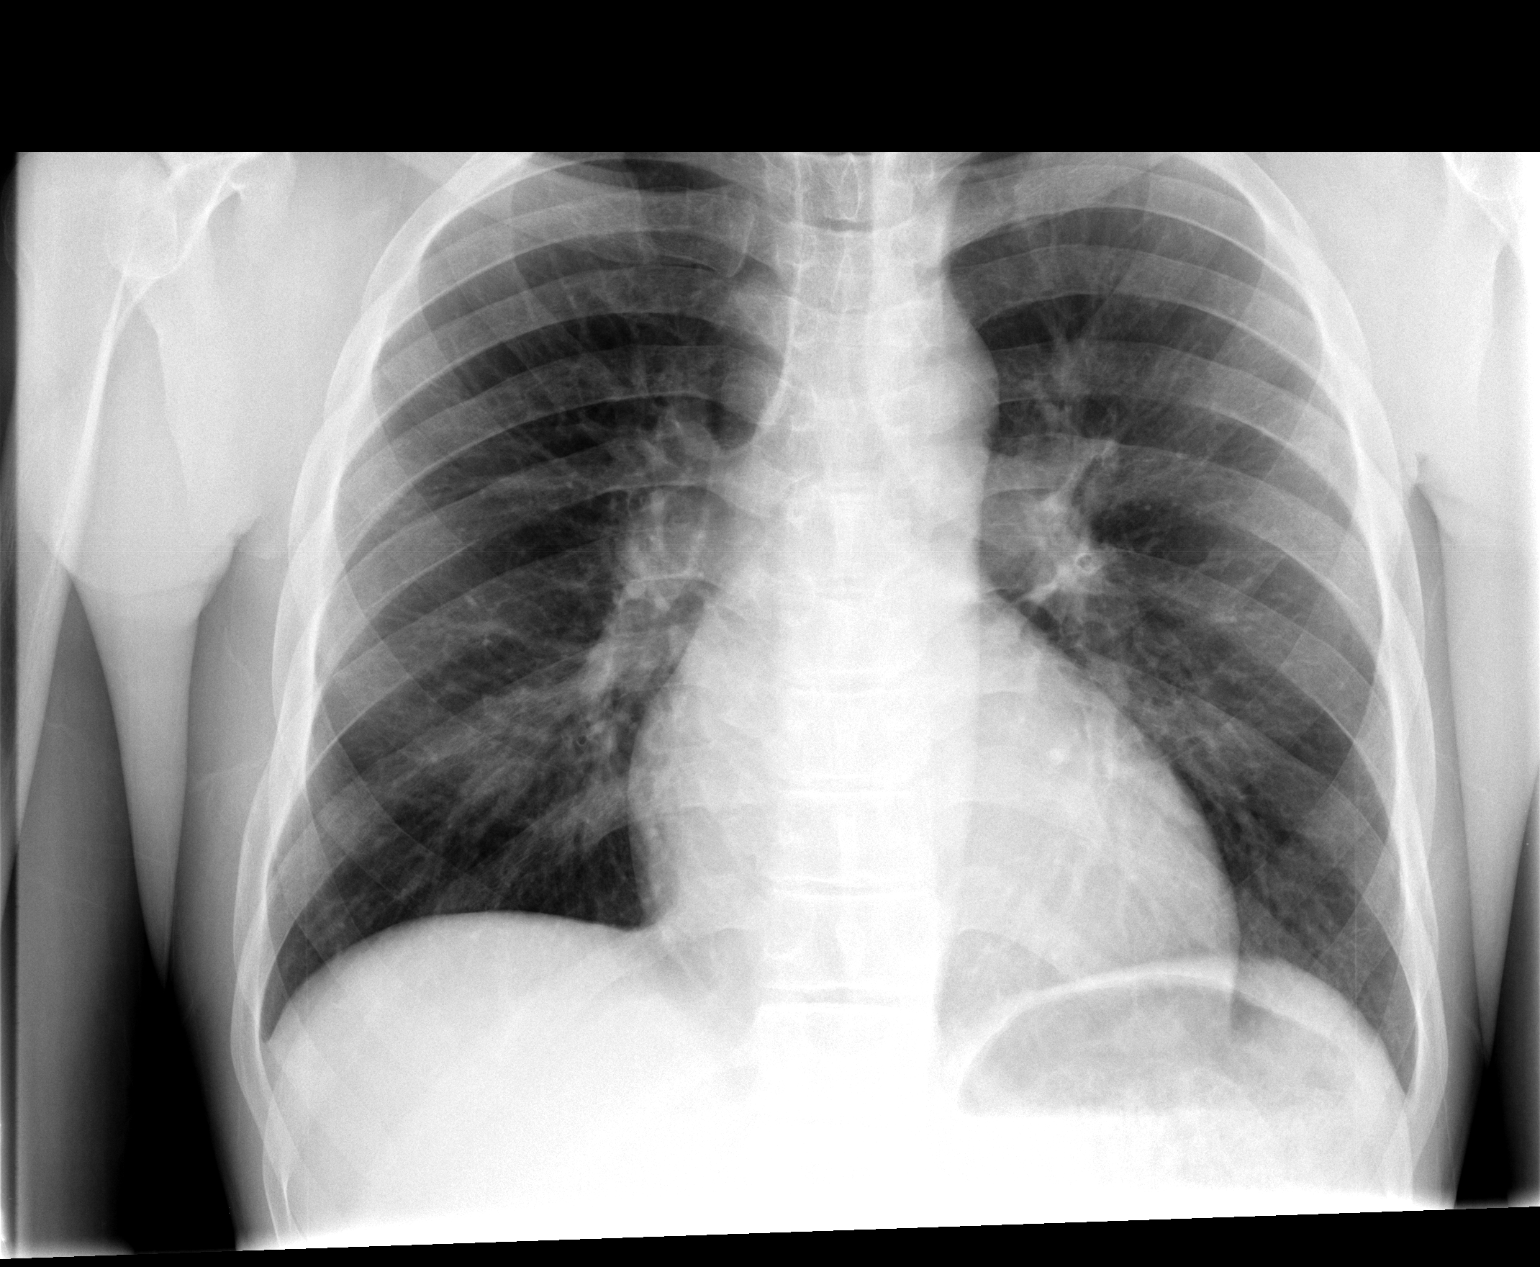

[view not recorded (2 of 2)]
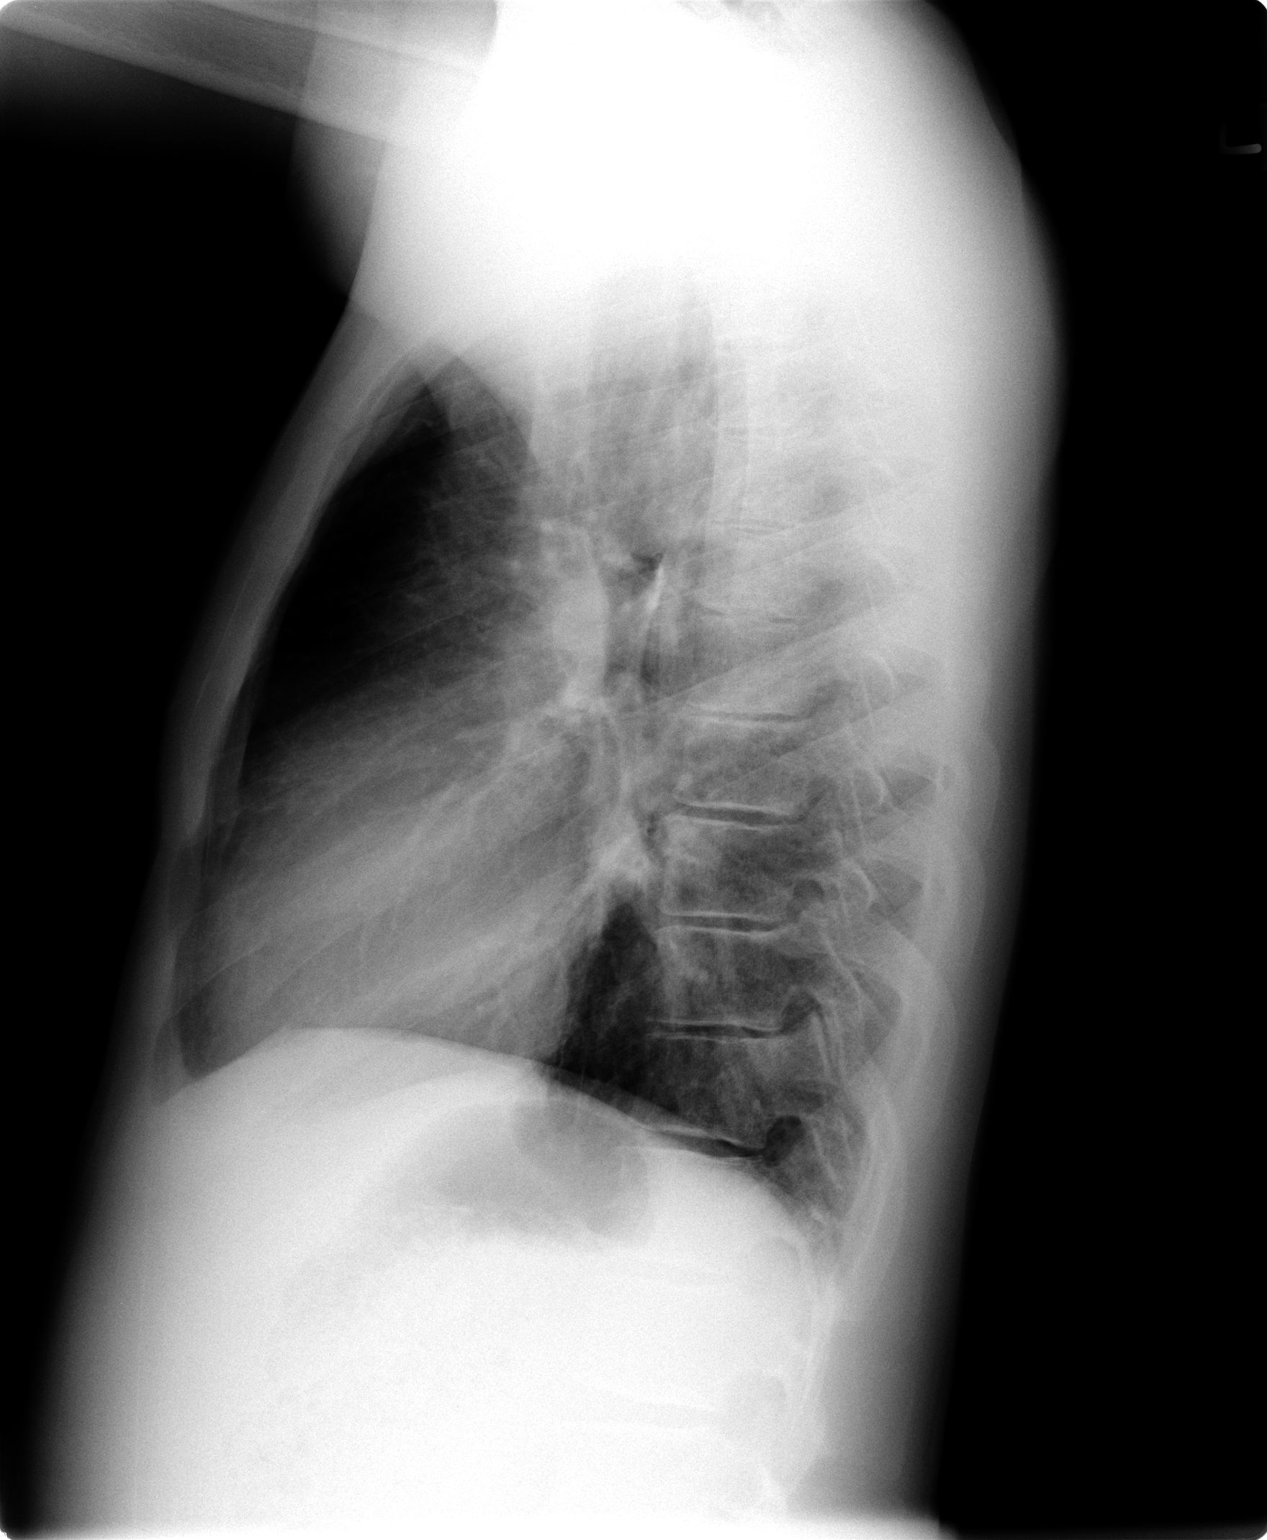

[2 of 2 positions shown; findings below may reference images not displayed]

FINDINGS: Two views of the chest show no active process.  The heart is within the upper limits of normal.  Mild peribronchial thickening is noted.
IMPRESSION: No pneumonia.  Mild peribronchial thickening.

## 2007-12-28 ENCOUNTER — Emergency Department (HOSPITAL_COMMUNITY): Admission: EM | Admit: 2007-12-28 | Discharge: 2007-12-28 | Payer: Self-pay | Admitting: Emergency Medicine

## 2007-12-31 ENCOUNTER — Emergency Department (HOSPITAL_COMMUNITY): Admission: EM | Admit: 2007-12-31 | Discharge: 2007-12-31 | Payer: Self-pay | Admitting: Emergency Medicine

## 2008-01-05 IMAGING — CR DG CHEST 2V
2 series · 2 of 2 positions shown · non-contrast
Comparison: 01/21/2005.

CLINICAL DATA: Chest pain and cough.  Sickle cell disease.  
 CHEST - 2 VIEW:

[w chest pa]
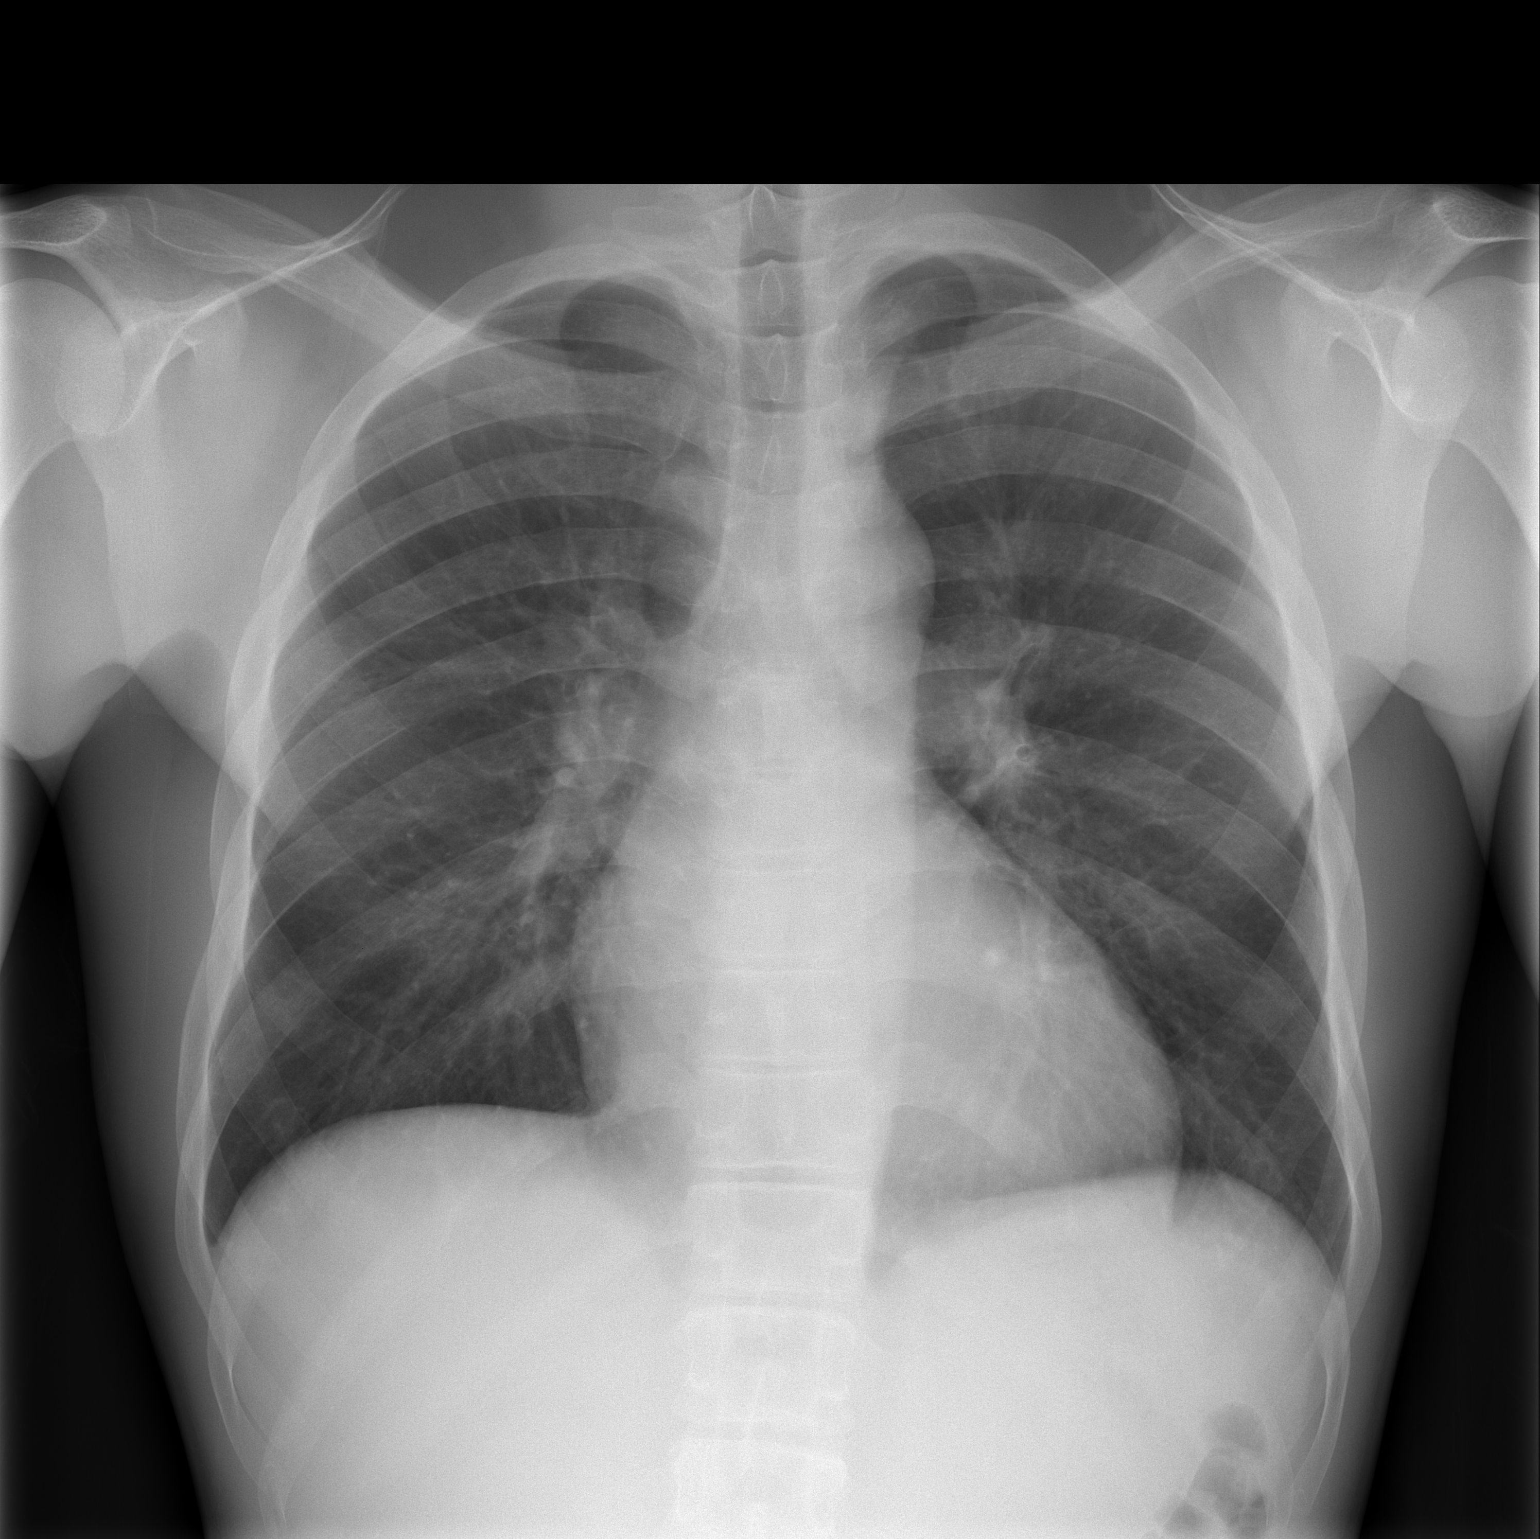

[w chest lat]
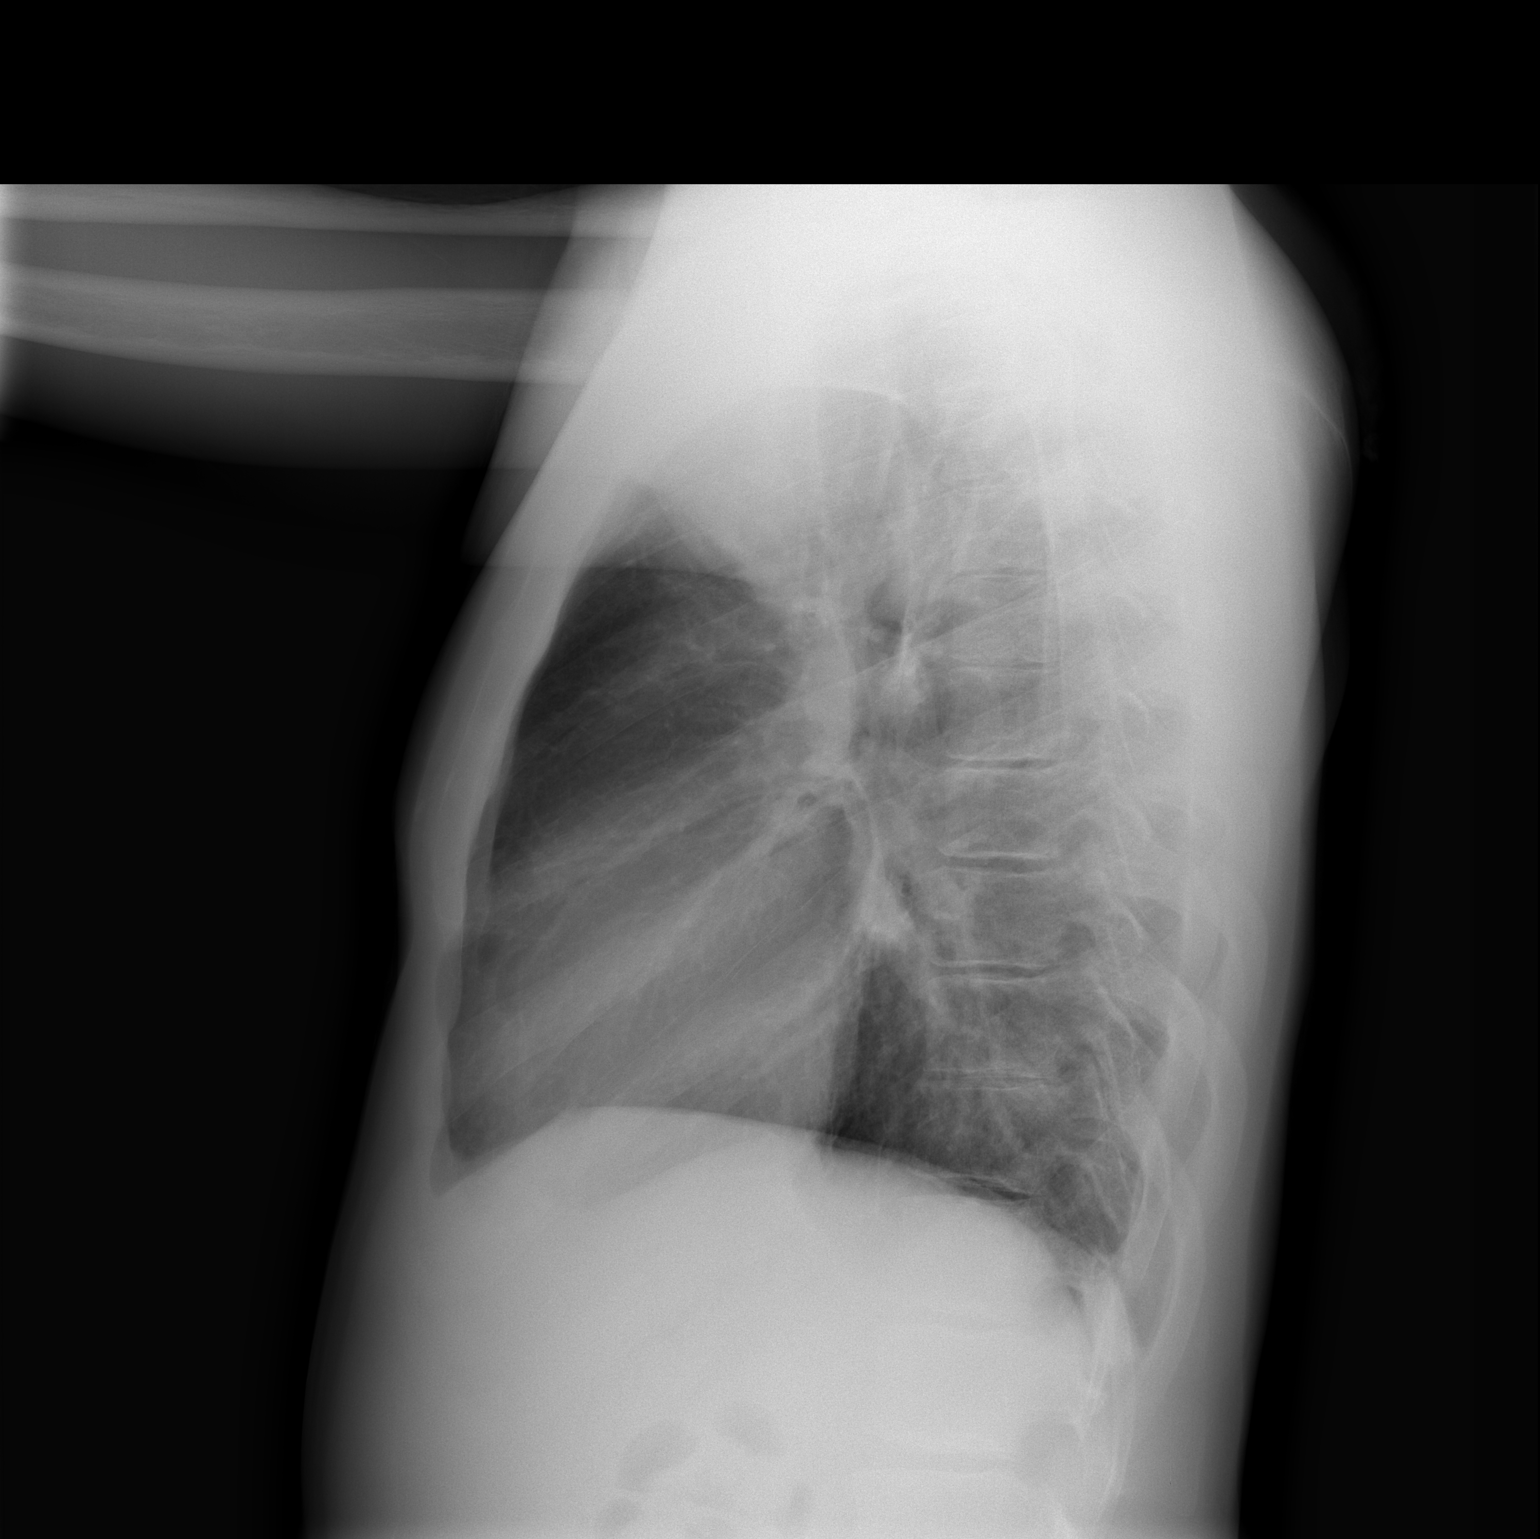

[2 of 2 positions shown; findings below may reference images not displayed]

Upper limits normal heart size and mild peribronchial thickening are stable.  The lungs are otherwise clear.  No evidence of pleural effusions or pneumothorax identified.  Visualized upper abdomen is unremarkable.
IMPRESSION: 1.   No evidence of acute cardiopulmonary disease.  
 2.   Stable upper limits normal heart size.  Mild peribronchial thickening.

## 2008-01-08 ENCOUNTER — Inpatient Hospital Stay (HOSPITAL_COMMUNITY): Admission: EM | Admit: 2008-01-08 | Discharge: 2008-01-15 | Payer: Self-pay | Admitting: Emergency Medicine
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
## 2008-01-11 ENCOUNTER — Encounter (INDEPENDENT_AMBULATORY_CARE_PROVIDER_SITE_OTHER): Payer: Self-pay | Admitting: Surgery

## 2008-01-18 ENCOUNTER — Inpatient Hospital Stay (HOSPITAL_COMMUNITY): Admission: EM | Admit: 2008-01-18 | Discharge: 2008-01-23 | Payer: Self-pay | Admitting: Emergency Medicine

## 2008-01-18 ENCOUNTER — Ambulatory Visit: Payer: Self-pay | Admitting: Family Medicine

## 2008-01-21 ENCOUNTER — Encounter: Payer: Self-pay | Admitting: Family Medicine

## 2008-02-07 IMAGING — CR DG CHEST 2V
2 series · 2 of 2 positions shown · non-contrast
Comparison: none

CLINICAL DATA: Sickle cell anemia.  Chest pain for one week.  Began vomiting last night.  
 CHEST - 2 VIEW:

[view not recorded (1 of 2)]
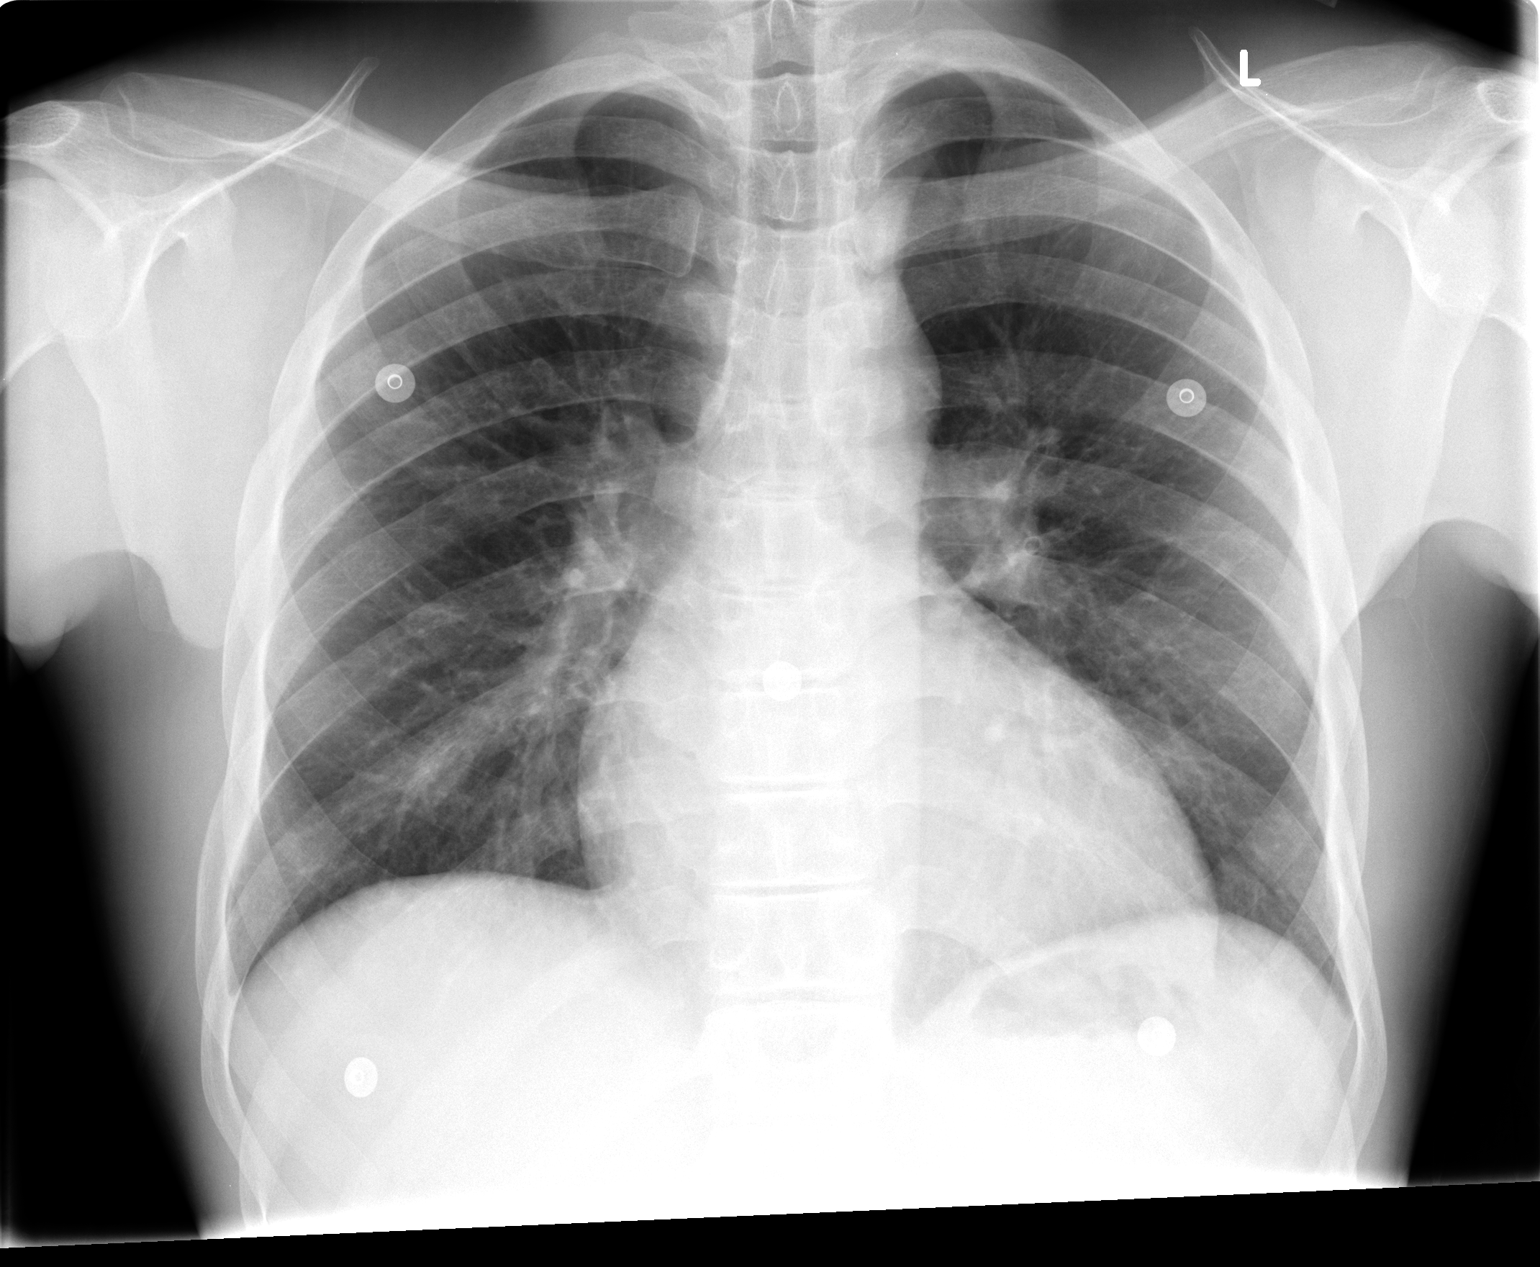

[view not recorded (2 of 2)]
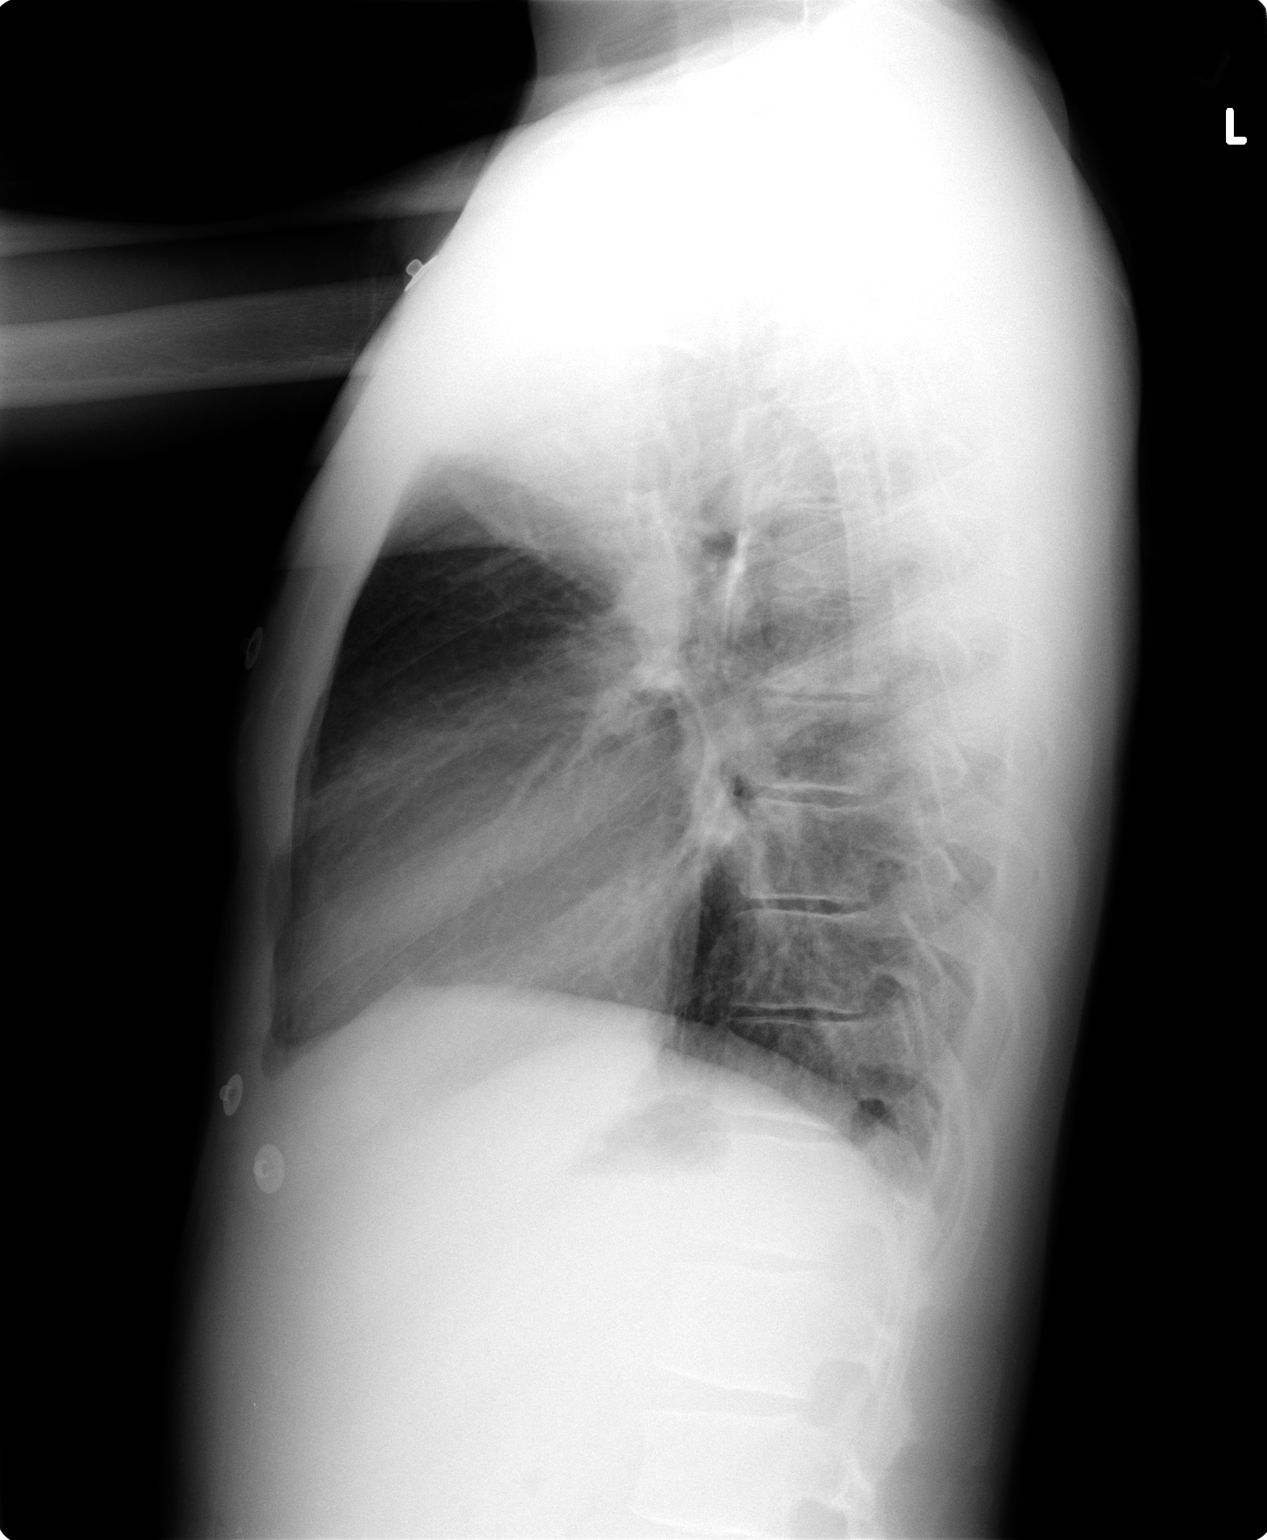

[2 of 2 positions shown; findings below may reference images not displayed]

FINDINGS: PA and lateral views of the chest are made and are compared to previous studies of 02/14/05 and again show mild stable cardiomegaly with mild diffuse peribronchial thickening which appears to be stable and unchanged.  There is no free air beneath the diaphragm.  There is an air fluid level within the stomach.  The bones are within the limits of normal.
IMPRESSION: Mild stable cardiomegaly.  Mild diffuse peribronchial thickening.  No acute change.

## 2008-02-15 ENCOUNTER — Emergency Department (HOSPITAL_COMMUNITY): Admission: EM | Admit: 2008-02-15 | Discharge: 2008-02-15 | Payer: Self-pay | Admitting: *Deleted

## 2008-04-01 ENCOUNTER — Emergency Department (HOSPITAL_COMMUNITY): Admission: EM | Admit: 2008-04-01 | Discharge: 2008-04-01 | Payer: Self-pay | Admitting: Emergency Medicine

## 2008-04-16 ENCOUNTER — Inpatient Hospital Stay (HOSPITAL_COMMUNITY): Admission: EM | Admit: 2008-04-16 | Discharge: 2008-04-16 | Payer: Self-pay | Admitting: Emergency Medicine

## 2008-04-17 ENCOUNTER — Inpatient Hospital Stay (HOSPITAL_COMMUNITY): Admission: EM | Admit: 2008-04-17 | Discharge: 2008-04-18 | Payer: Self-pay | Admitting: Emergency Medicine

## 2008-05-09 ENCOUNTER — Emergency Department (HOSPITAL_COMMUNITY): Admission: EM | Admit: 2008-05-09 | Discharge: 2008-05-09 | Payer: Self-pay | Admitting: Emergency Medicine
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
## 2008-05-30 ENCOUNTER — Emergency Department (HOSPITAL_COMMUNITY): Admission: EM | Admit: 2008-05-30 | Discharge: 2008-05-30 | Payer: Self-pay | Admitting: Emergency Medicine

## 2008-06-06 IMAGING — CR DG CHEST 2V
2 series · 2 of 2 positions shown · non-contrast
Comparison: [REDACTED] chest x-ray of 06/16/05.

CLINICAL DATA: Right-sided pain, sickle cell crisis. 
 CHEST - 2 VIEW ? 07/14/05:

[w chest pa]
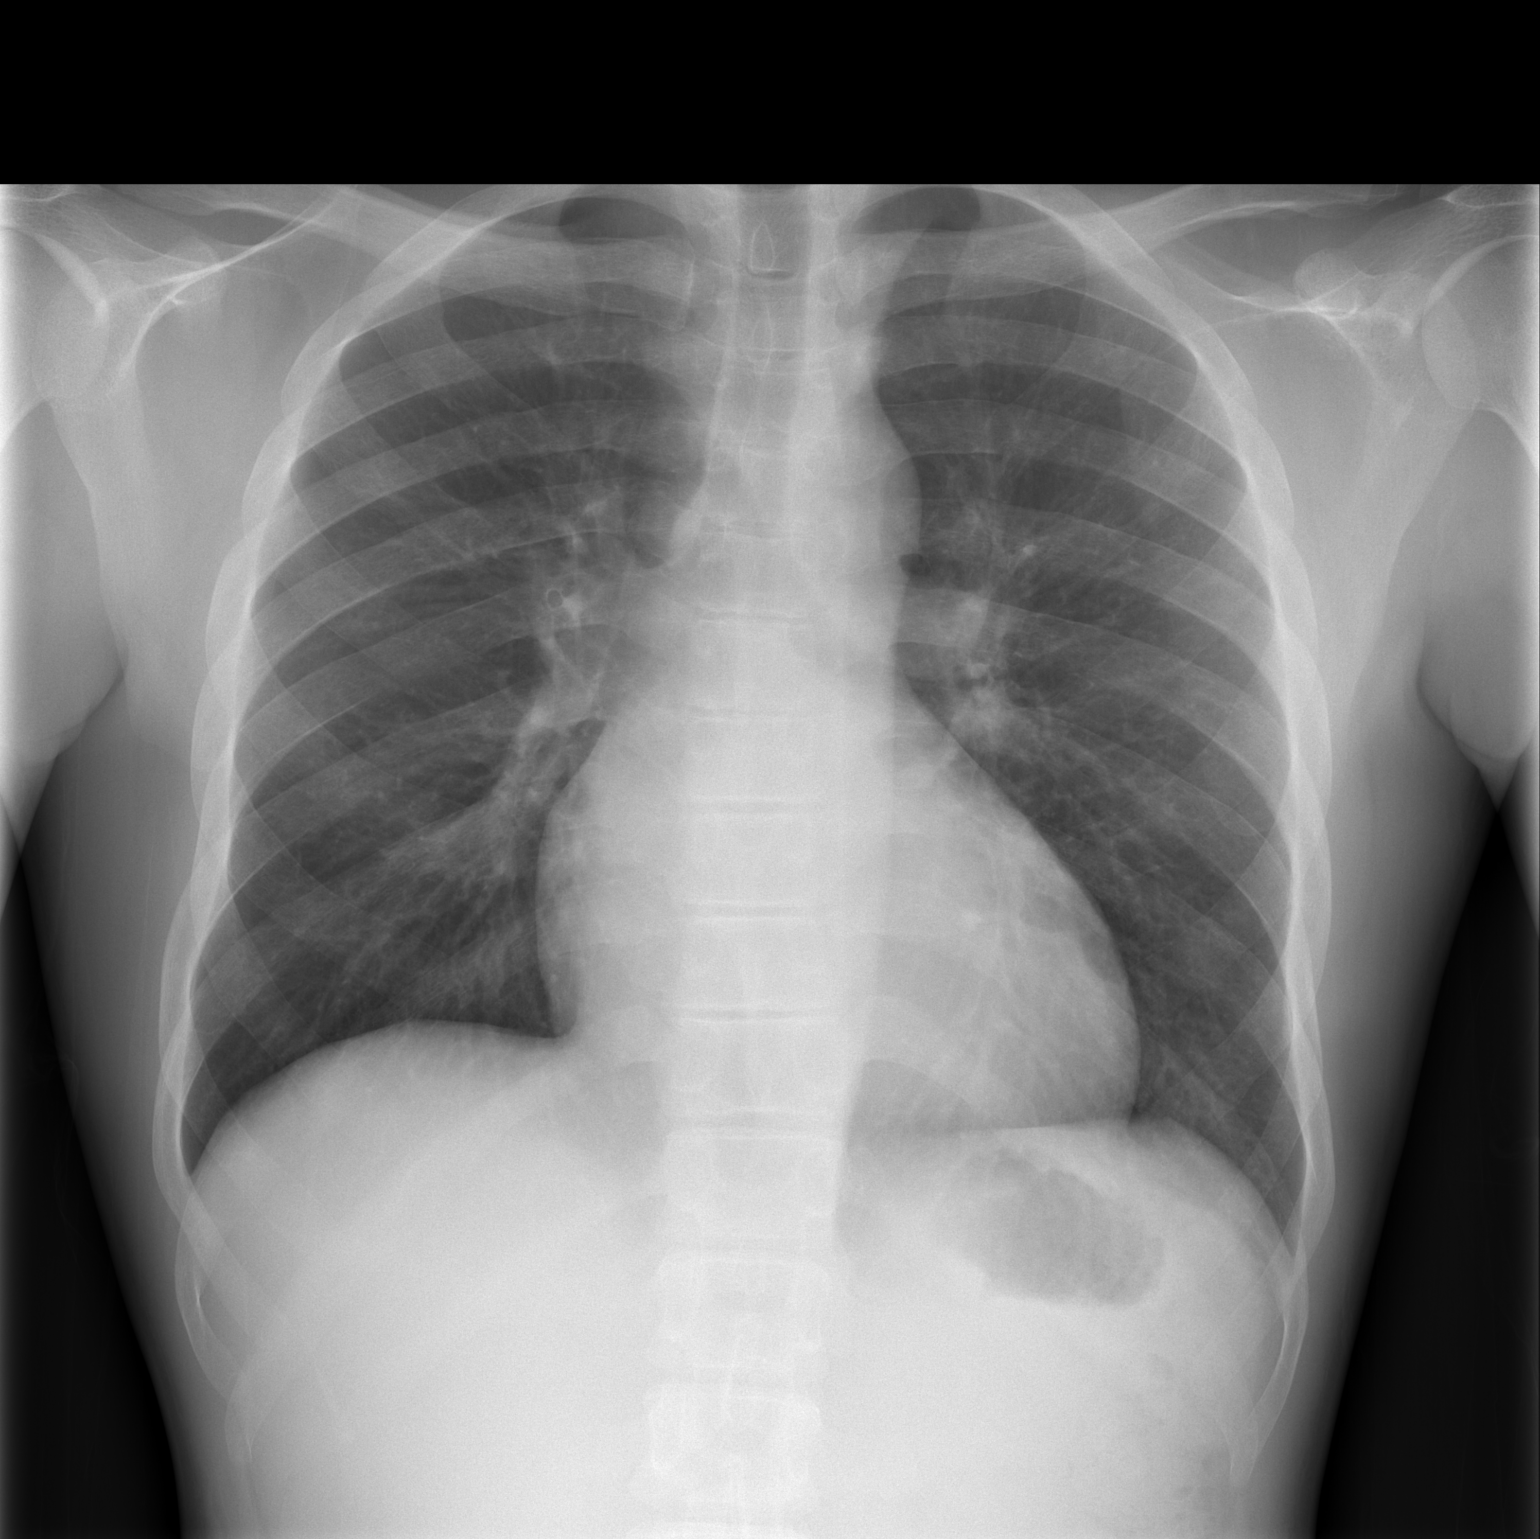

[w chest lat]
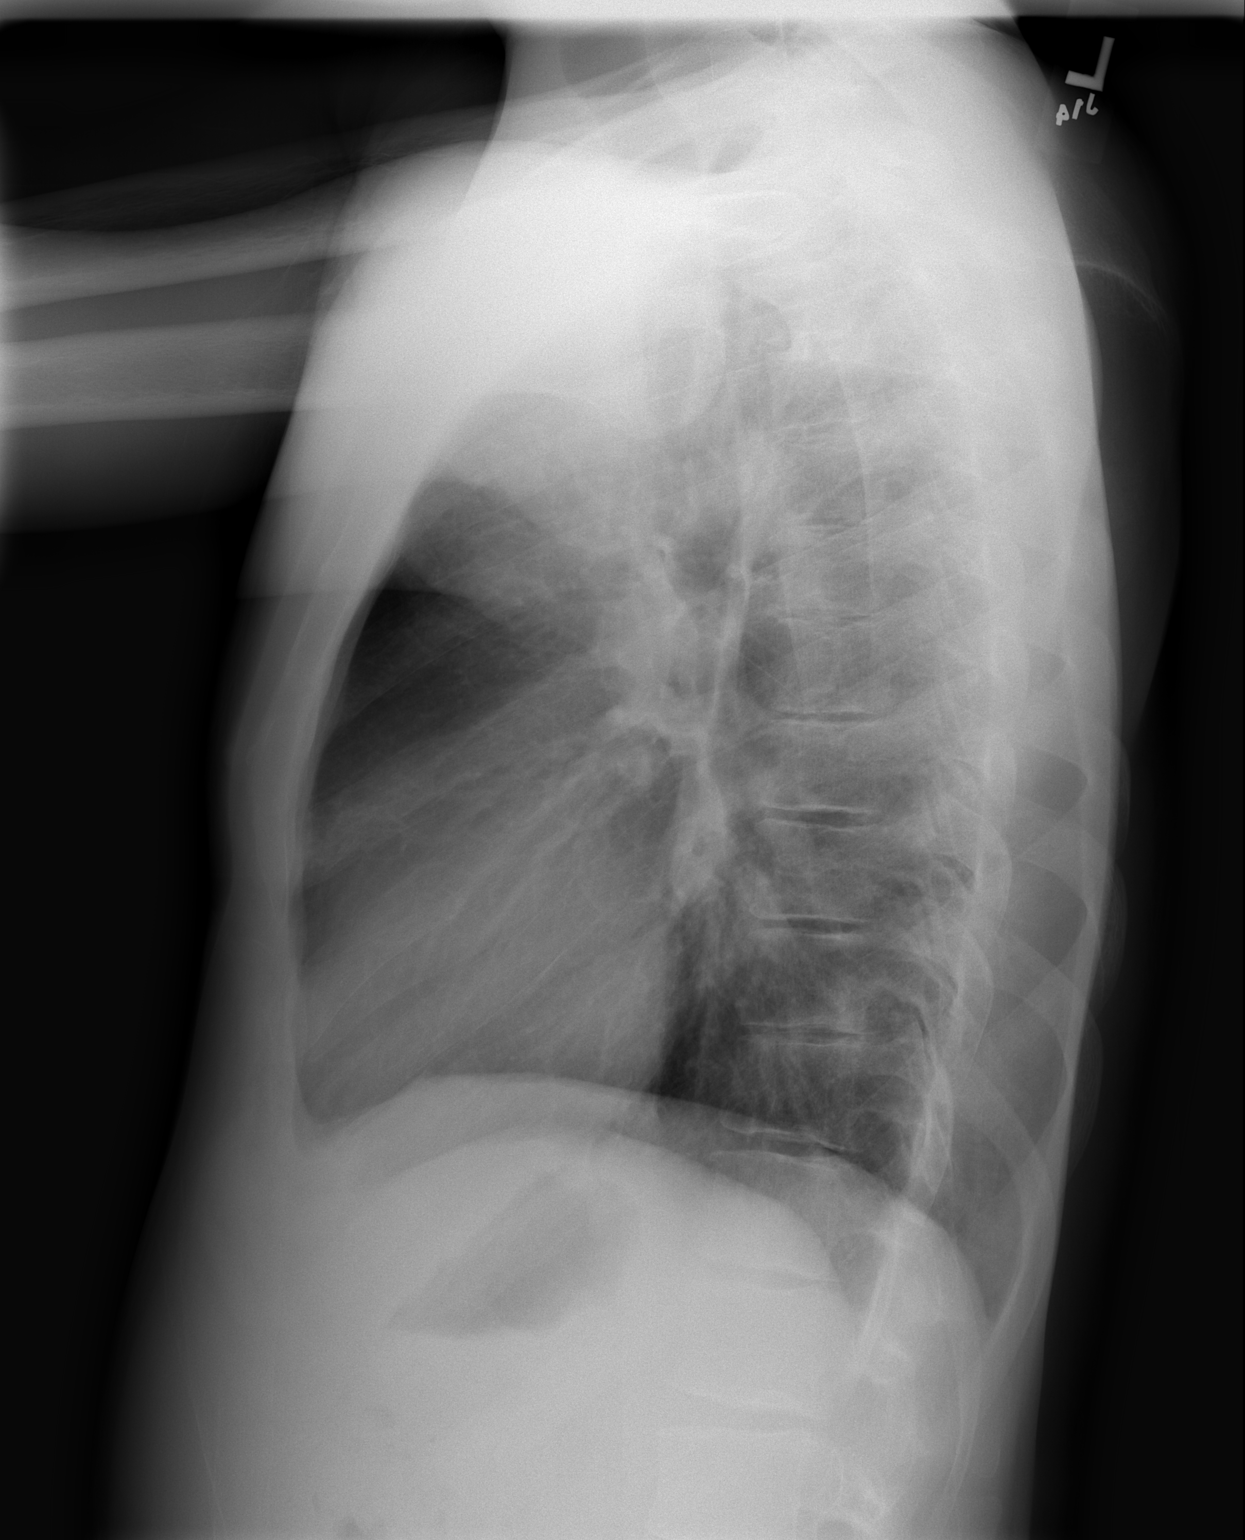

[2 of 2 positions shown; findings below may reference images not displayed]

FINDINGS: No change in slight cardiomegaly.  The lungs are clear.   Mediastinum, hila, pleura and osseous structures are stable.
IMPRESSION: 1. Stable slight cardiomegaly.
 2.  Otherwise no active disease.

## 2008-06-21 ENCOUNTER — Ambulatory Visit: Payer: Self-pay | Admitting: Cardiovascular Disease

## 2008-06-21 ENCOUNTER — Inpatient Hospital Stay (HOSPITAL_COMMUNITY): Admission: EM | Admit: 2008-06-21 | Discharge: 2008-06-23 | Payer: Self-pay | Admitting: Emergency Medicine

## 2008-06-22 ENCOUNTER — Encounter: Payer: Self-pay | Admitting: Cardiovascular Disease

## 2008-06-23 ENCOUNTER — Encounter (INDEPENDENT_AMBULATORY_CARE_PROVIDER_SITE_OTHER): Payer: Self-pay | Admitting: Internal Medicine

## 2008-06-25 IMAGING — CR DG CHEST 2V
2 series · 2 of 2 positions shown · non-contrast
Comparison: none

HISTORY: Chest pain, cough, sickle cell crisis

CHEST 2 VIEWS:
Comparison 07/14/2005
Mild stable cardiac enlargement.
Normal mediastinal contours and pulmonary vascularity.
Minimal chronic bronchitic changes.
No pulmonary infiltrate or pleural effusion.
Bones unremarkable.

[w chest pa]
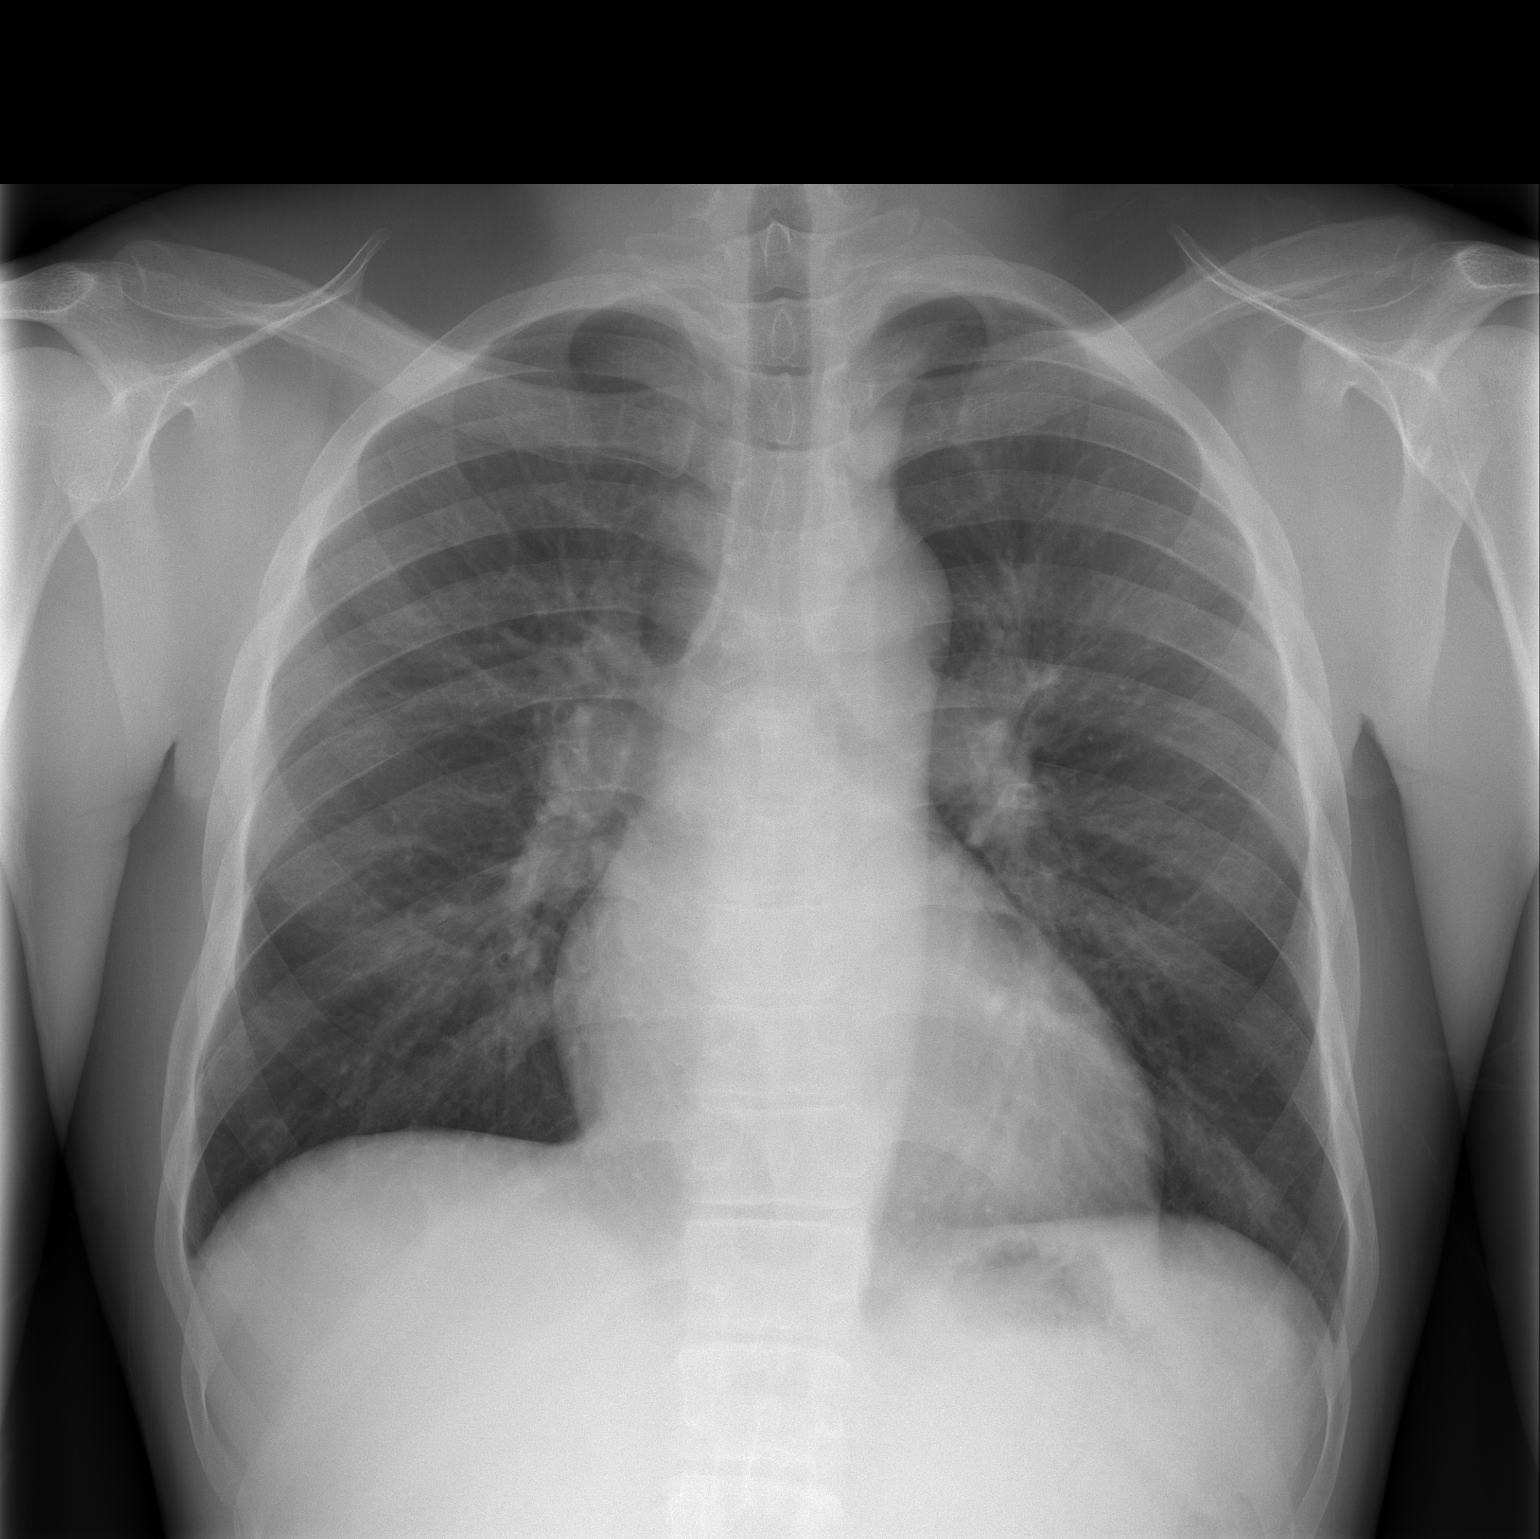

[w chest lat]
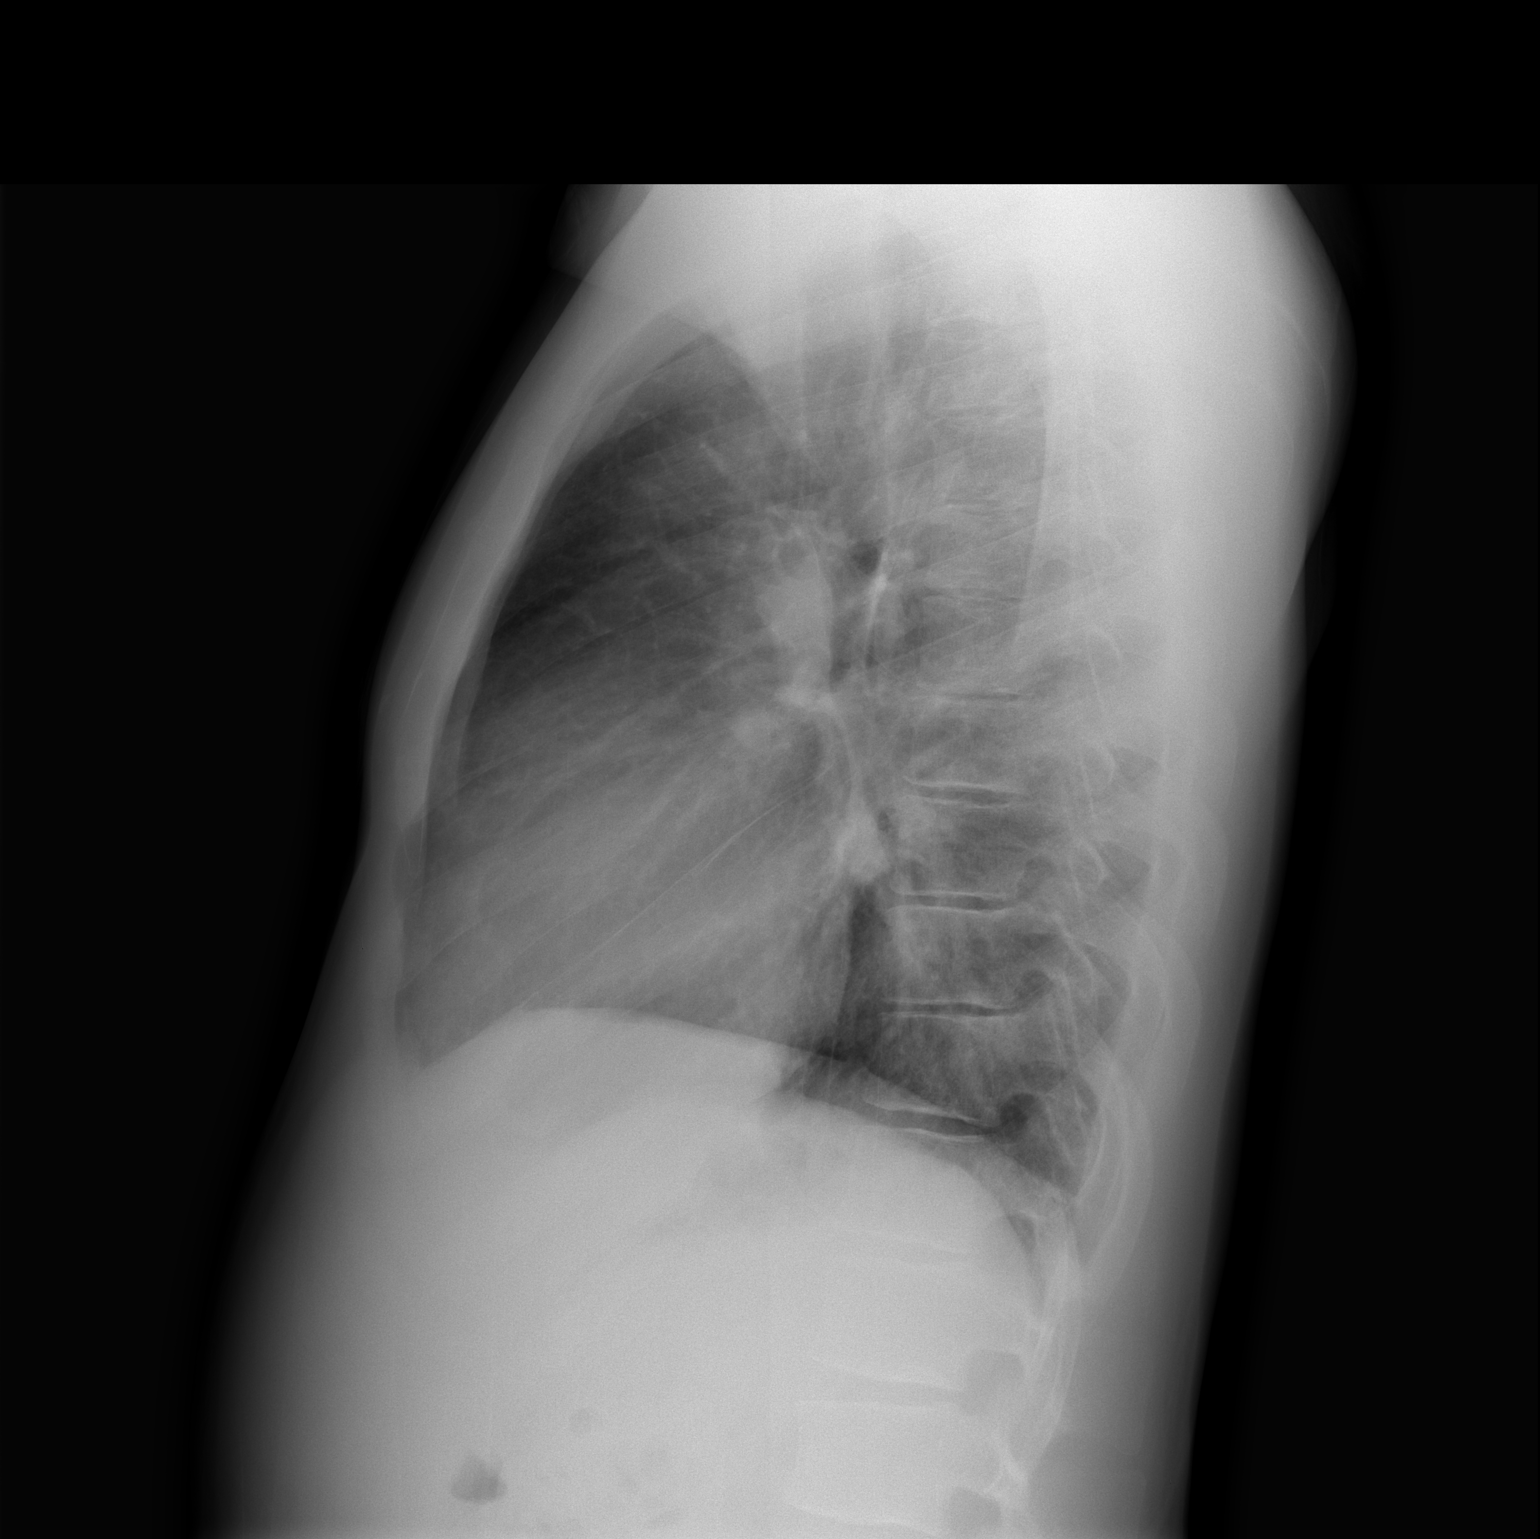

[2 of 2 positions shown; findings below may reference images not displayed]

IMPRESSION: Stable mild cardiac enlargement and chronic bronchitic changes.
No acute abnormalities.

## 2008-07-28 ENCOUNTER — Emergency Department (HOSPITAL_COMMUNITY): Admission: EM | Admit: 2008-07-28 | Discharge: 2008-07-28 | Payer: Self-pay | Admitting: Emergency Medicine

## 2008-09-26 ENCOUNTER — Emergency Department (HOSPITAL_COMMUNITY): Admission: EM | Admit: 2008-09-26 | Discharge: 2008-09-26 | Payer: Self-pay | Admitting: Emergency Medicine

## 2008-10-09 ENCOUNTER — Inpatient Hospital Stay (HOSPITAL_COMMUNITY): Admission: EM | Admit: 2008-10-09 | Discharge: 2008-10-14 | Payer: Self-pay | Admitting: Emergency Medicine

## 2008-10-15 ENCOUNTER — Emergency Department (HOSPITAL_COMMUNITY): Admission: EM | Admit: 2008-10-15 | Discharge: 2008-10-16 | Payer: Self-pay | Admitting: Emergency Medicine

## 2008-10-28 IMAGING — CR DG CHEST 2V
2 series · 2 of 2 positions shown · non-contrast
Comparison: 08/02/05.

CLINICAL DATA: Chest pain, shortness of breath. 
 CHEST - 2 VIEW:

[w chest pa]
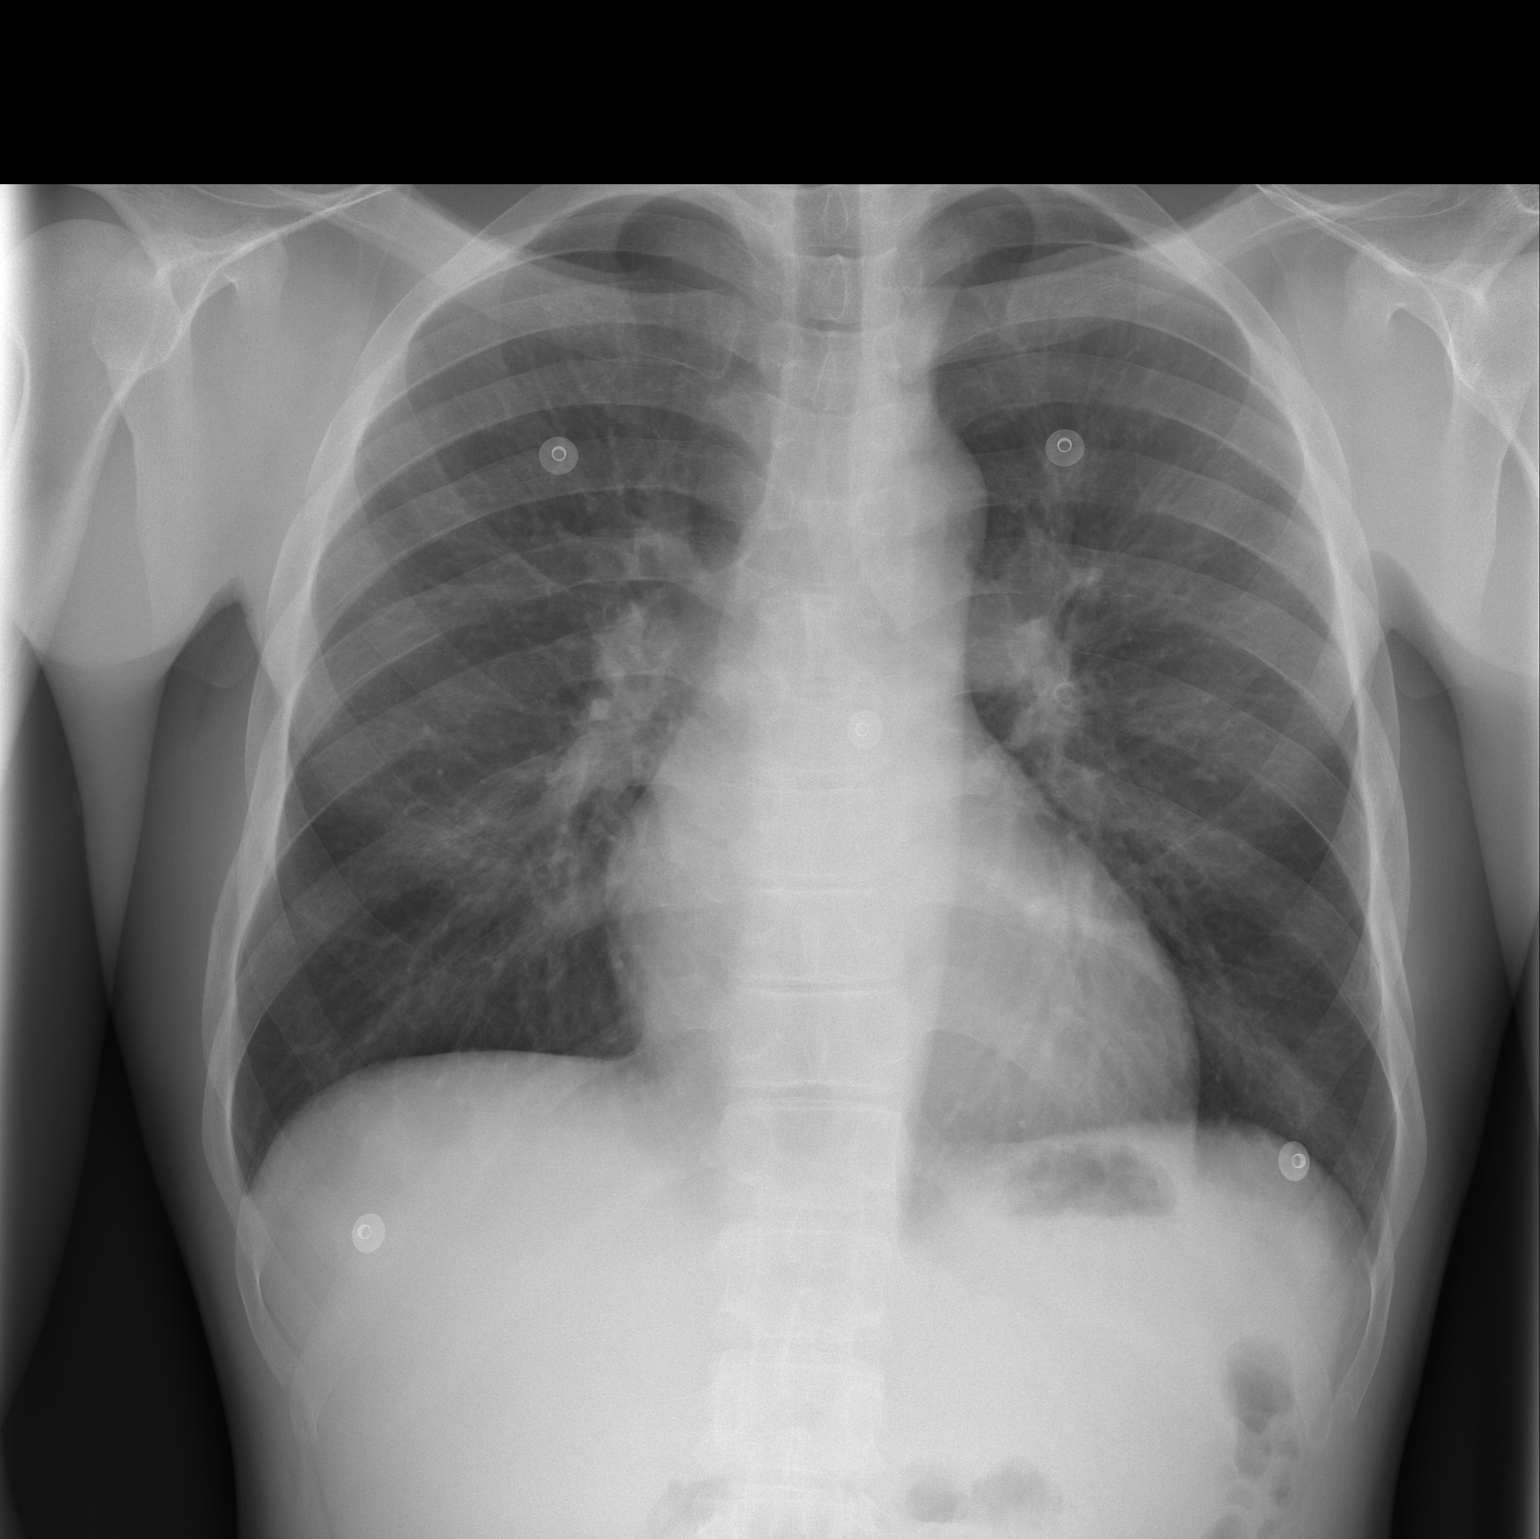

[w chest lat]
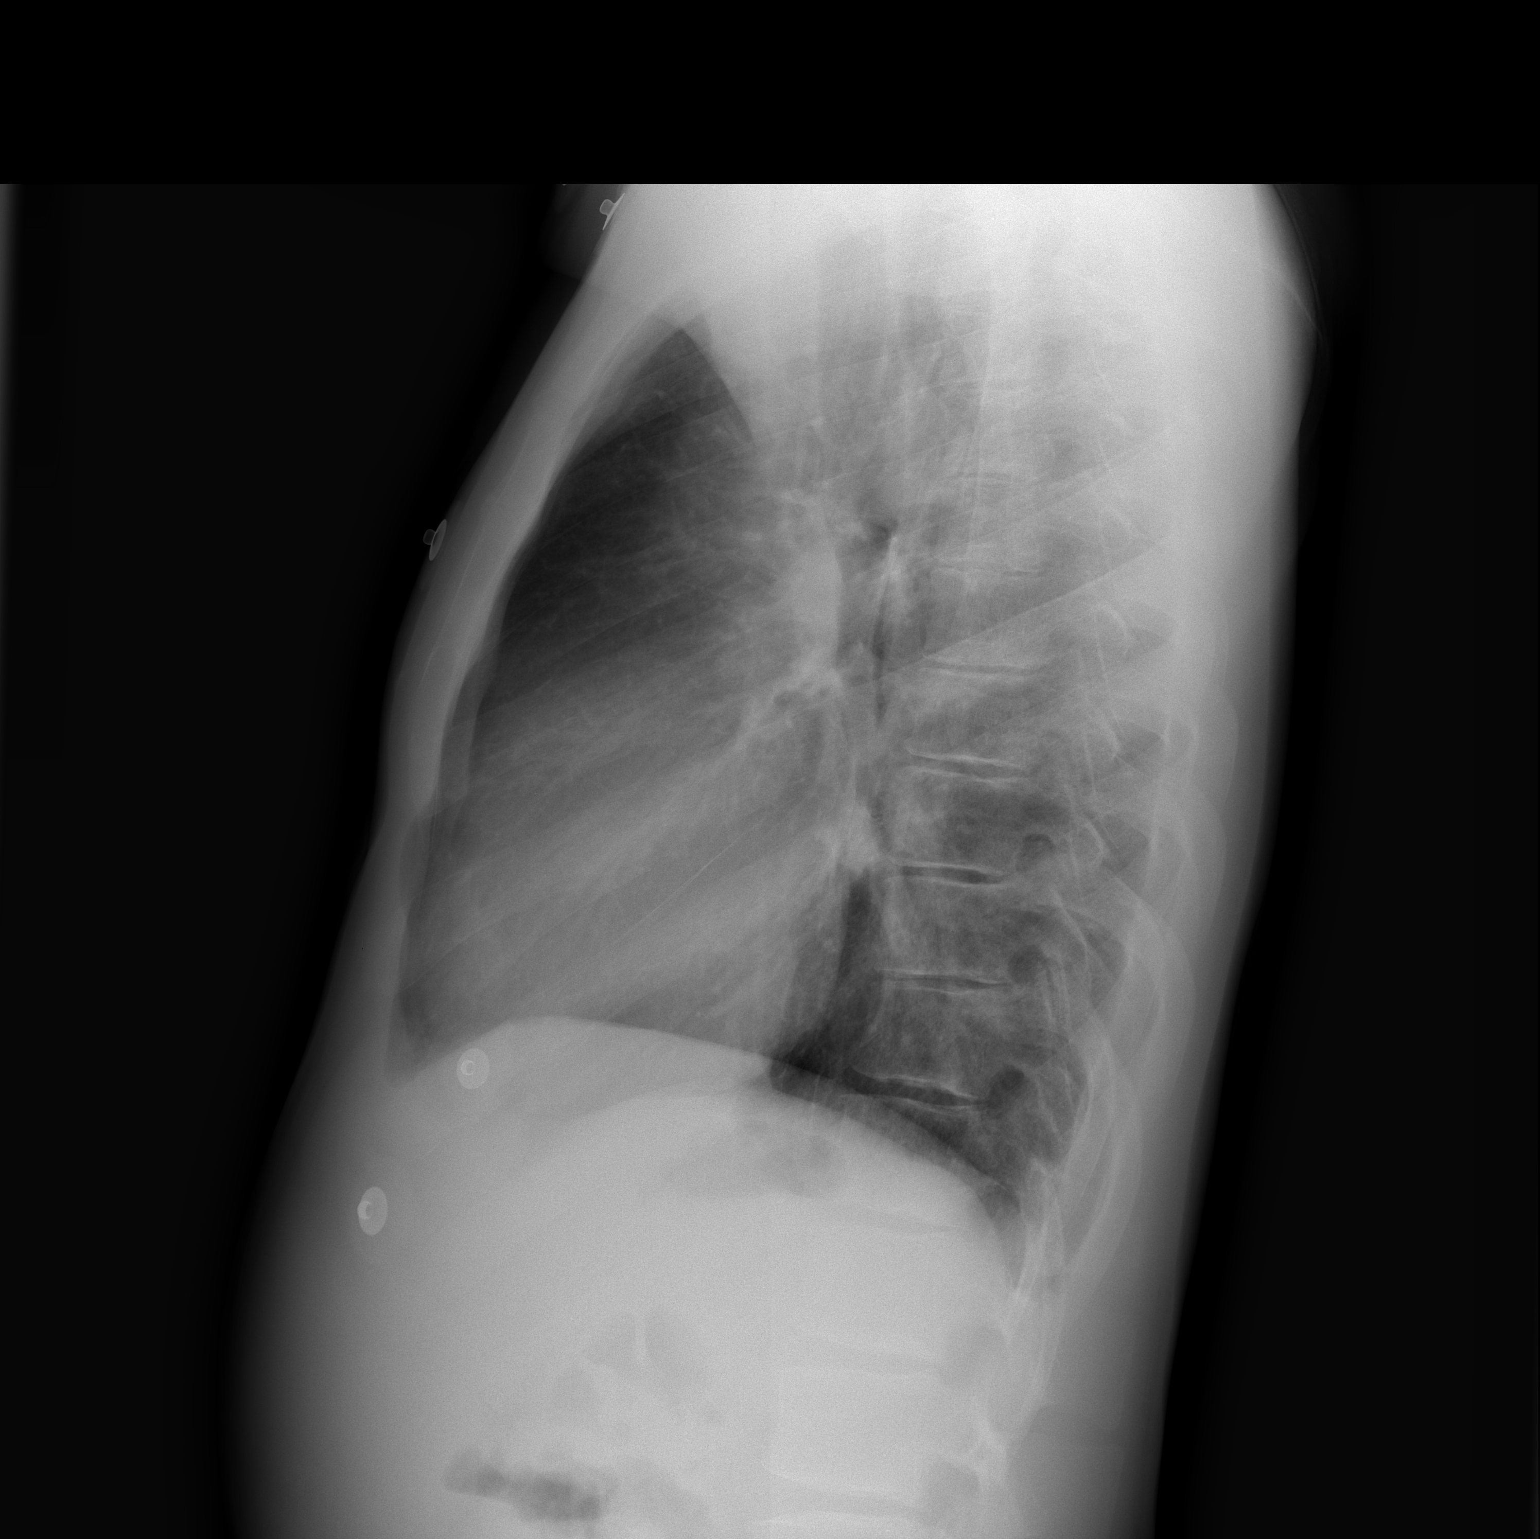

[2 of 2 positions shown; findings below may reference images not displayed]

FINDINGS: The heart is upper normal in size.  Bronchitic changes are stable.   The lungs are clear otherwise.  No pneumothoraces or effusions are seen.
IMPRESSION: Stable bronchitic changes.

## 2008-11-26 ENCOUNTER — Emergency Department (HOSPITAL_COMMUNITY): Admission: EM | Admit: 2008-11-26 | Discharge: 2008-11-26 | Payer: Self-pay | Admitting: Emergency Medicine

## 2008-12-23 ENCOUNTER — Emergency Department (HOSPITAL_COMMUNITY): Admission: EM | Admit: 2008-12-23 | Discharge: 2008-12-23 | Payer: Self-pay | Admitting: Emergency Medicine

## 2009-01-04 ENCOUNTER — Inpatient Hospital Stay (HOSPITAL_COMMUNITY): Admission: EM | Admit: 2009-01-04 | Discharge: 2009-01-14 | Payer: Self-pay | Admitting: Emergency Medicine

## 2009-01-27 IMAGING — CR DG CHEST 2V
2 series · 2 of 2 positions shown · non-contrast
Comparison: Comparison is made with same date at [DATE] a.m.

CLINICAL DATA: 25-year-old male.  Sickle cell. Chest pain.
 CHEST - 2 VIEW ? 03/06/06 AT [DATE] P.M.:

[w chest pa]
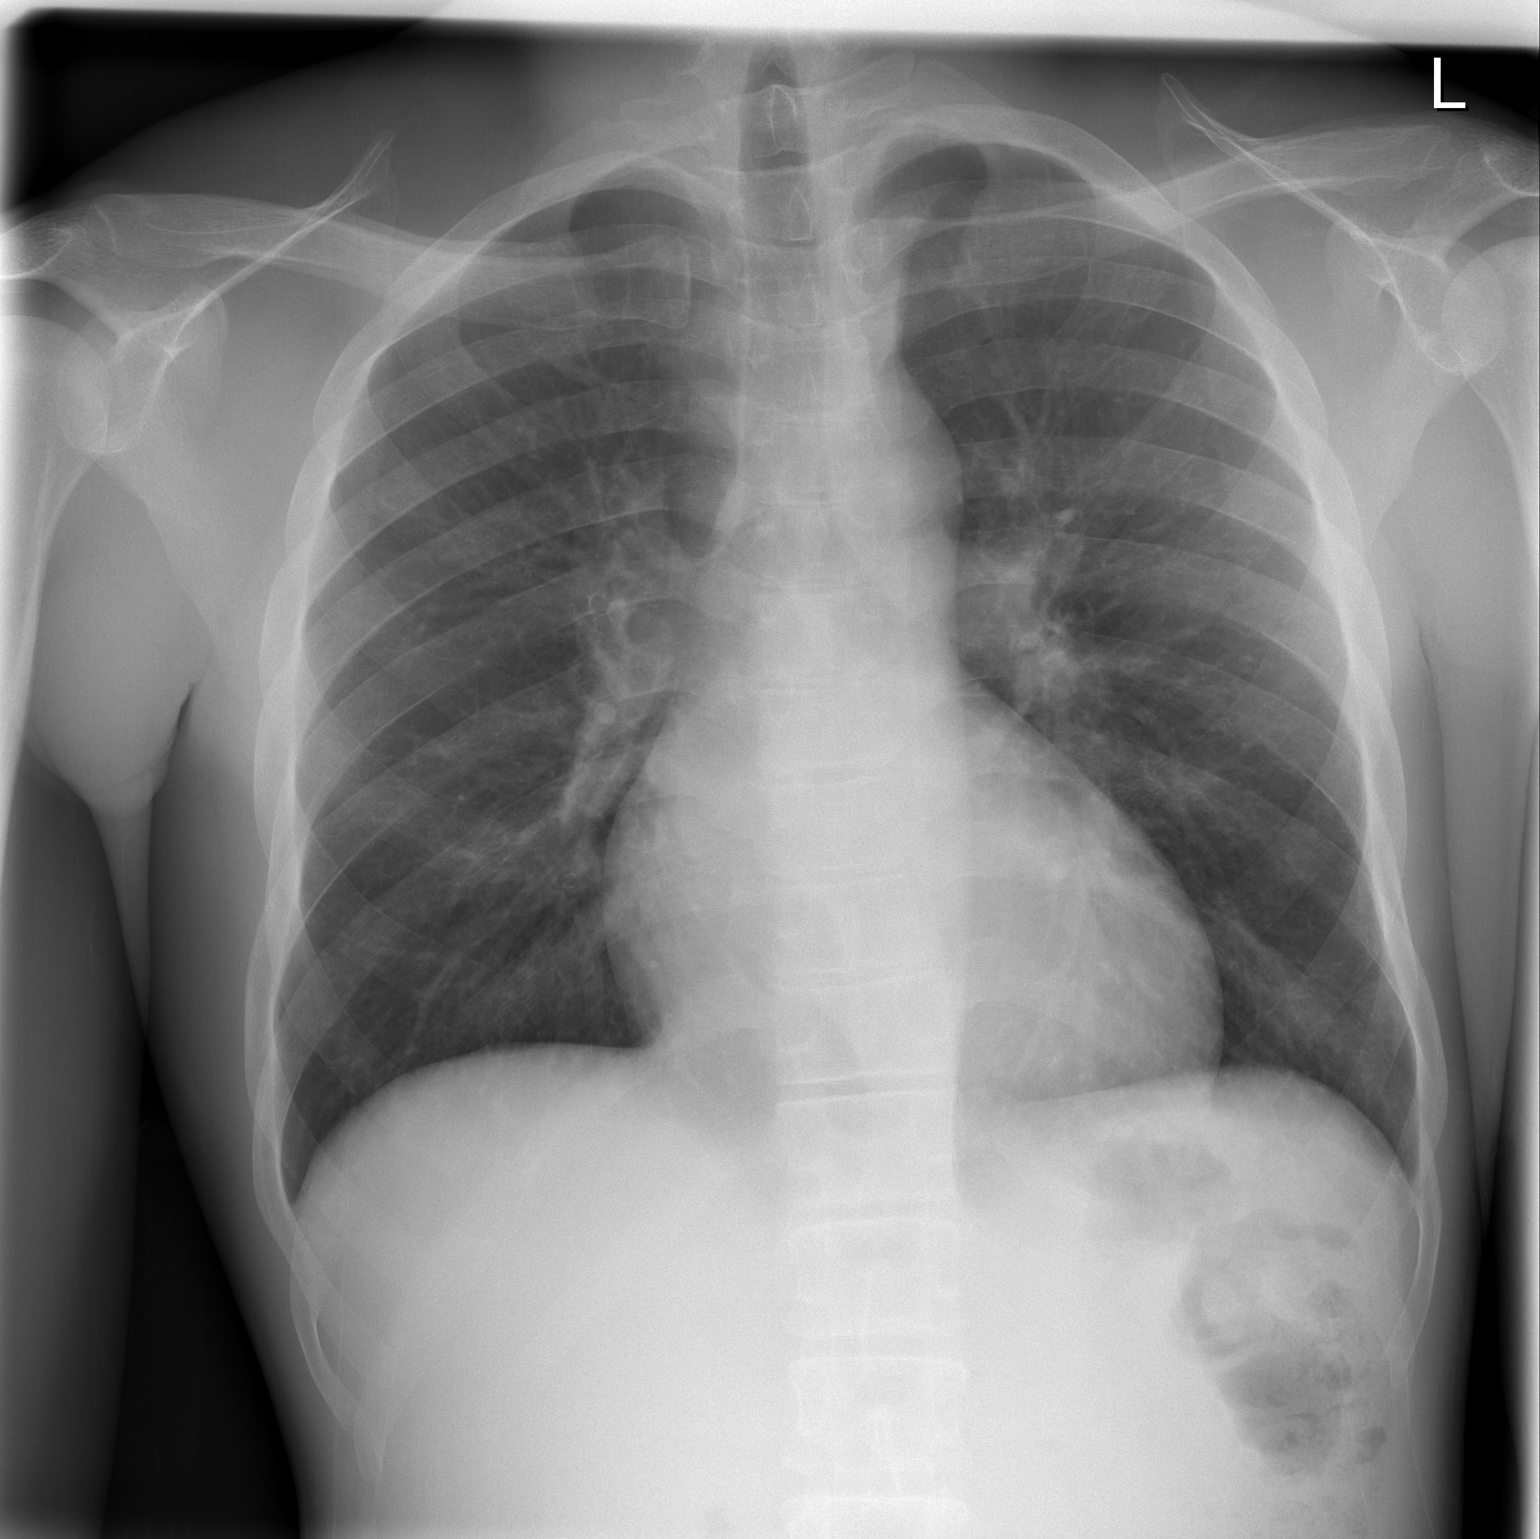

[w chest lat]
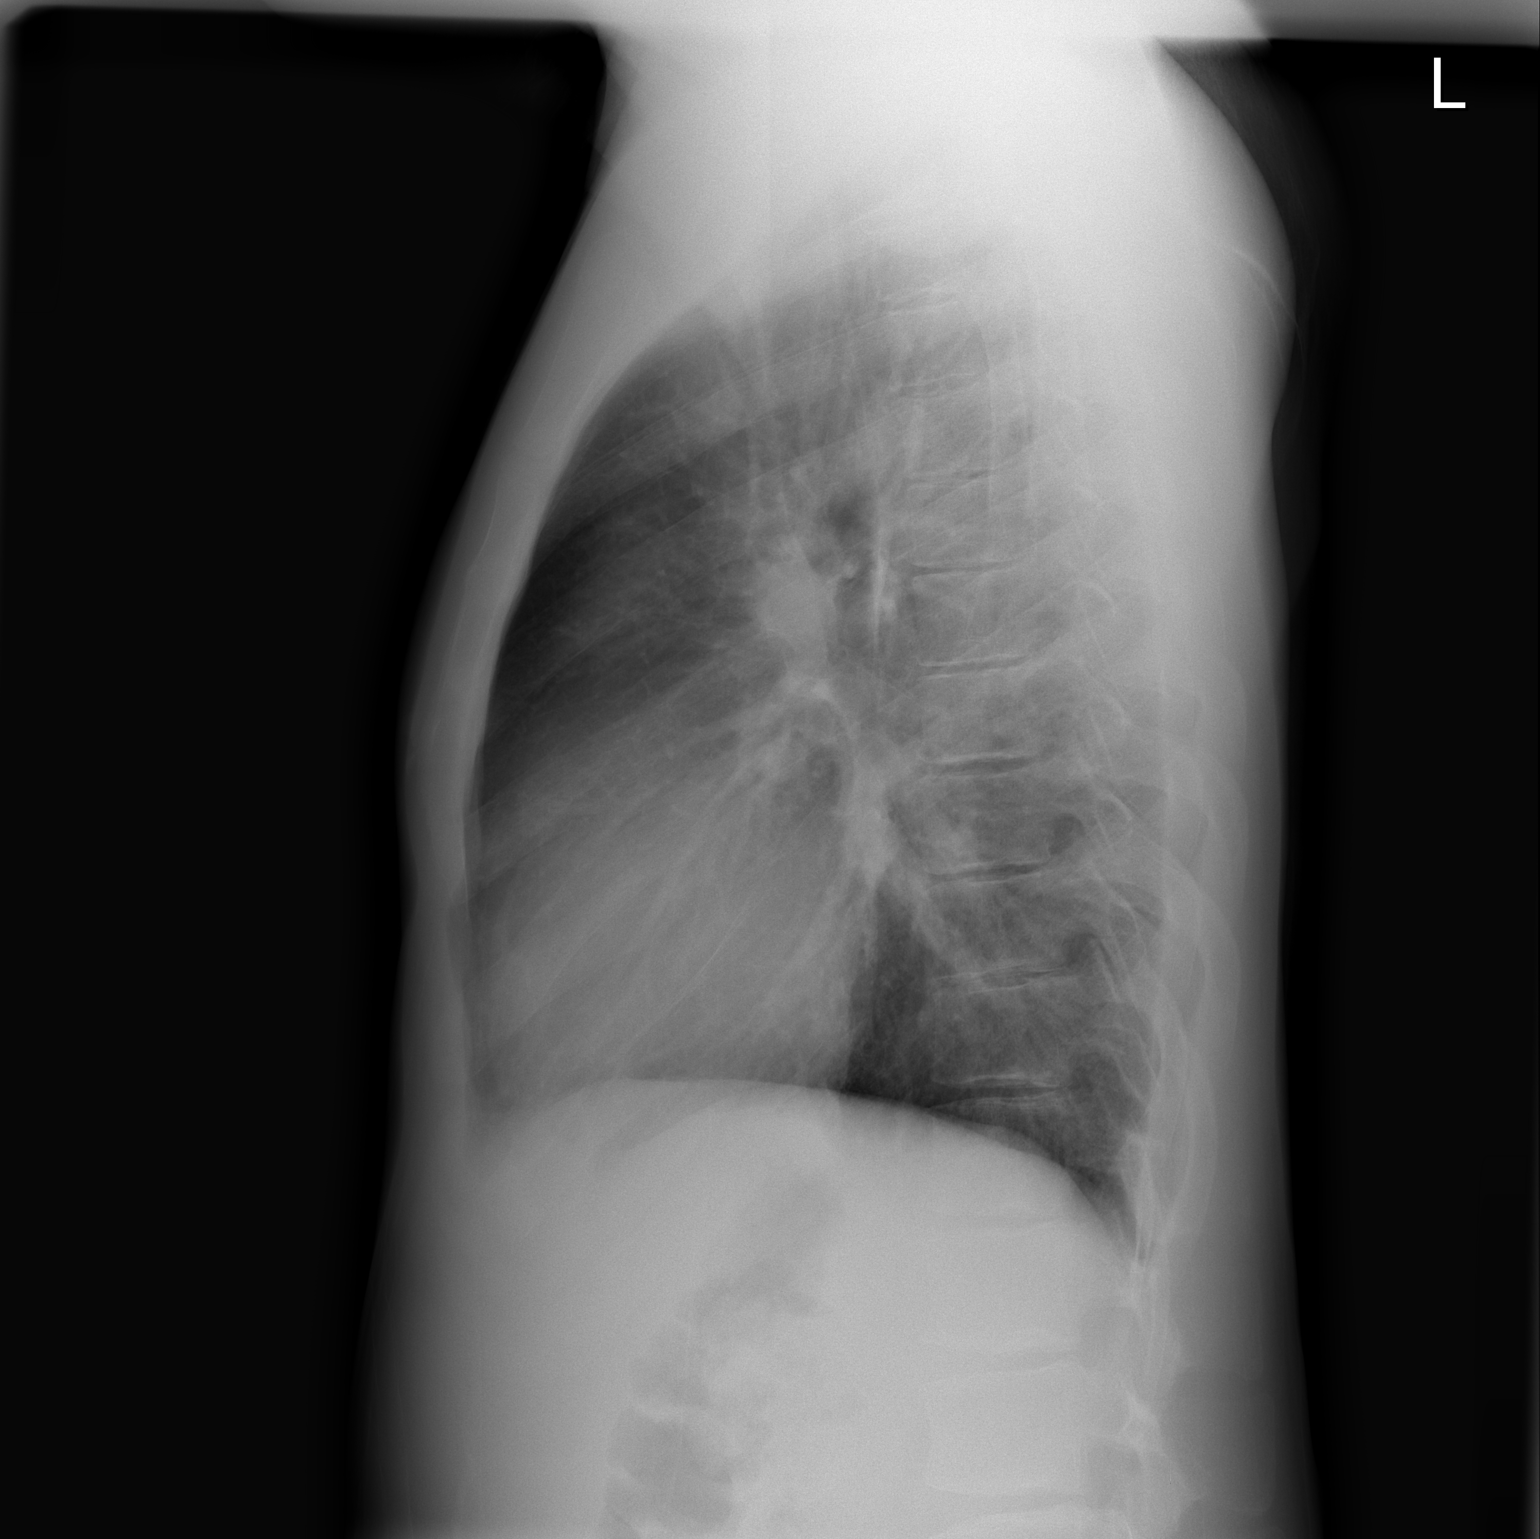

[2 of 2 positions shown; findings below may reference images not displayed]

FINDINGS: The cardiopericardial silhouette is within normal limits for size. The lungs remain clear. The visualized soft tissues and bony thorax are unremarkable.
IMPRESSION: No acute cardiopulmonary disease.

## 2009-01-27 IMAGING — CR DG CHEST 2V
2 series · 2 of 2 positions shown · non-contrast
Comparison: 12/05/05.

CLINICAL DATA: 25-year-old male, chest wall pain.  Sickle cell disease. 
 CHEST - 2 VIEW:

[w chest pa]
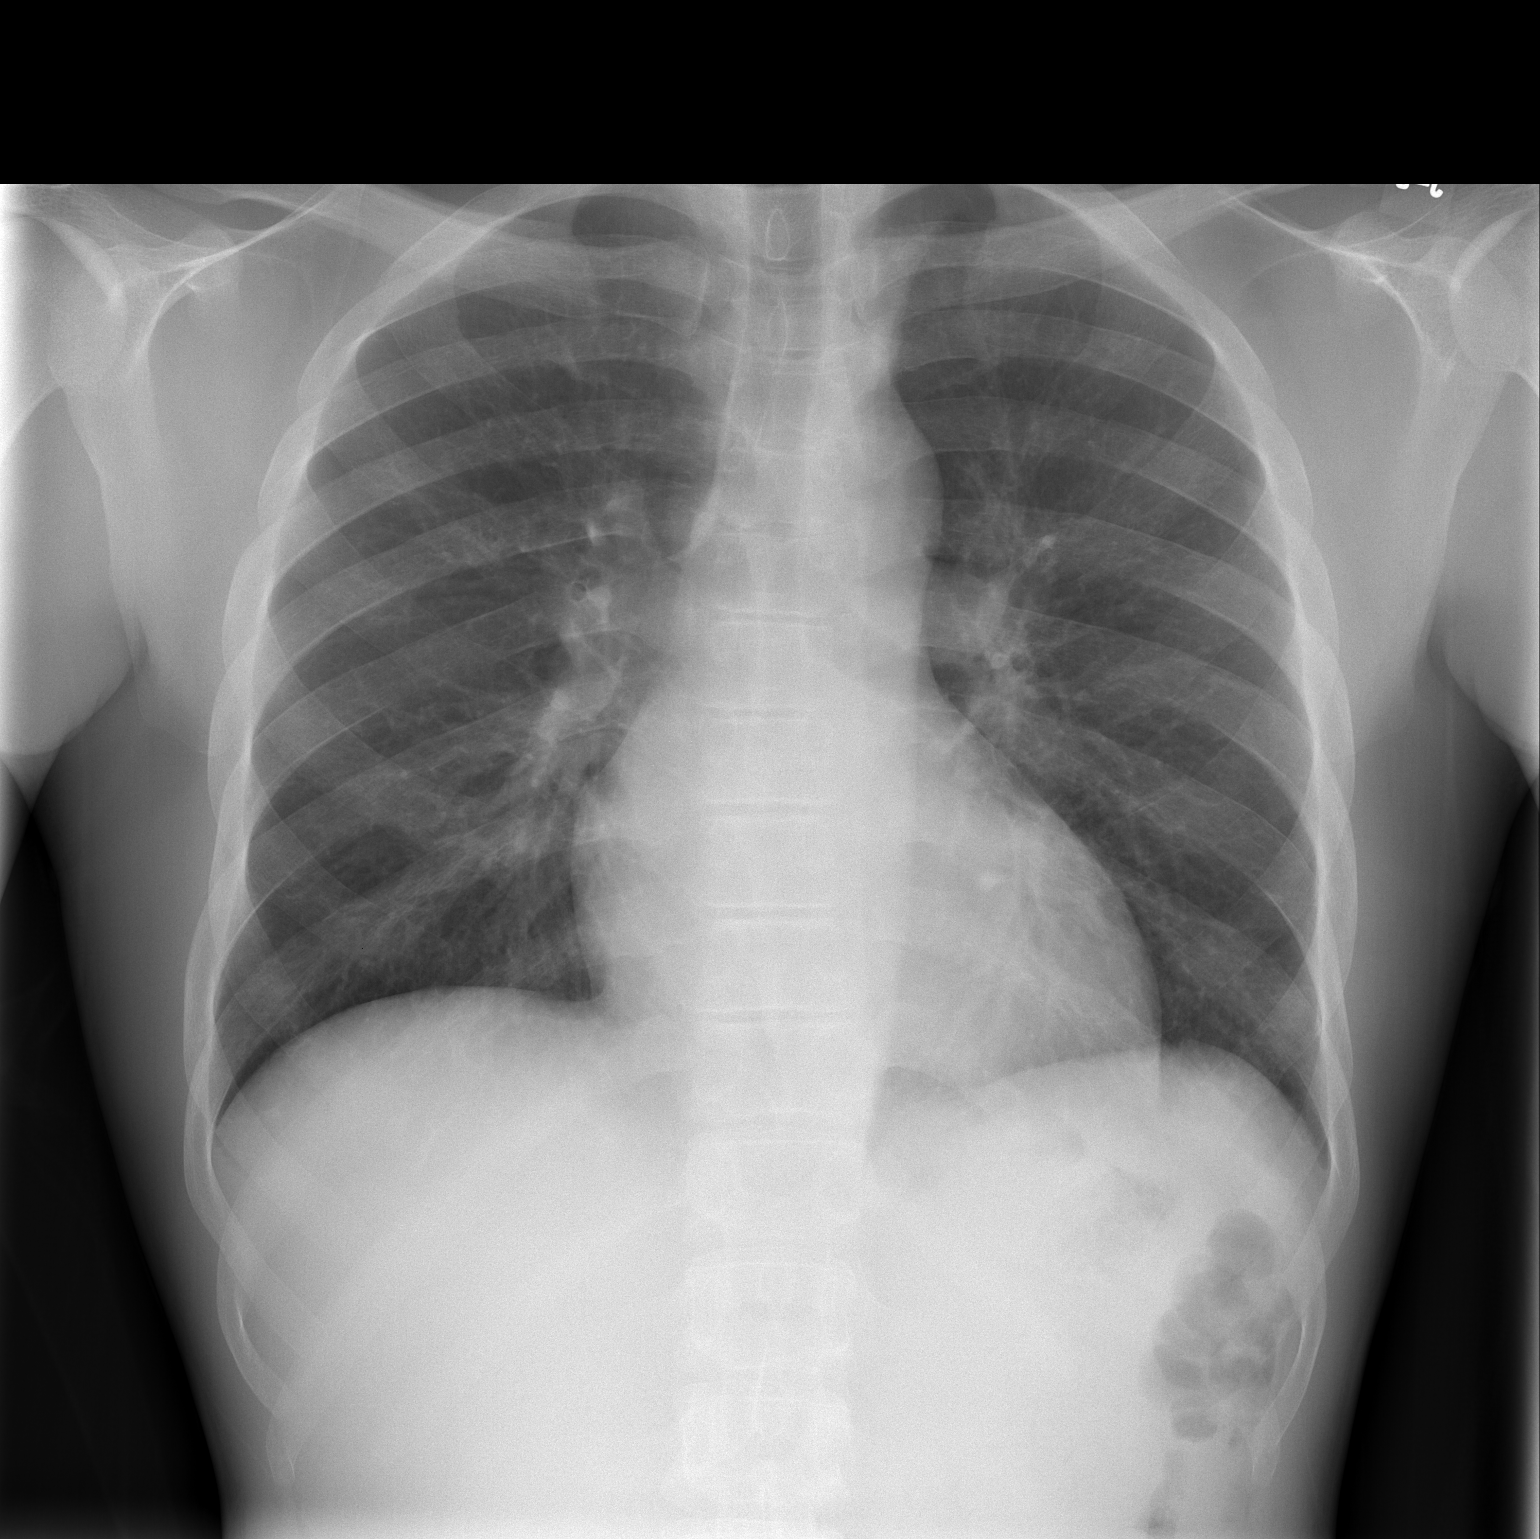

[w chest lat]
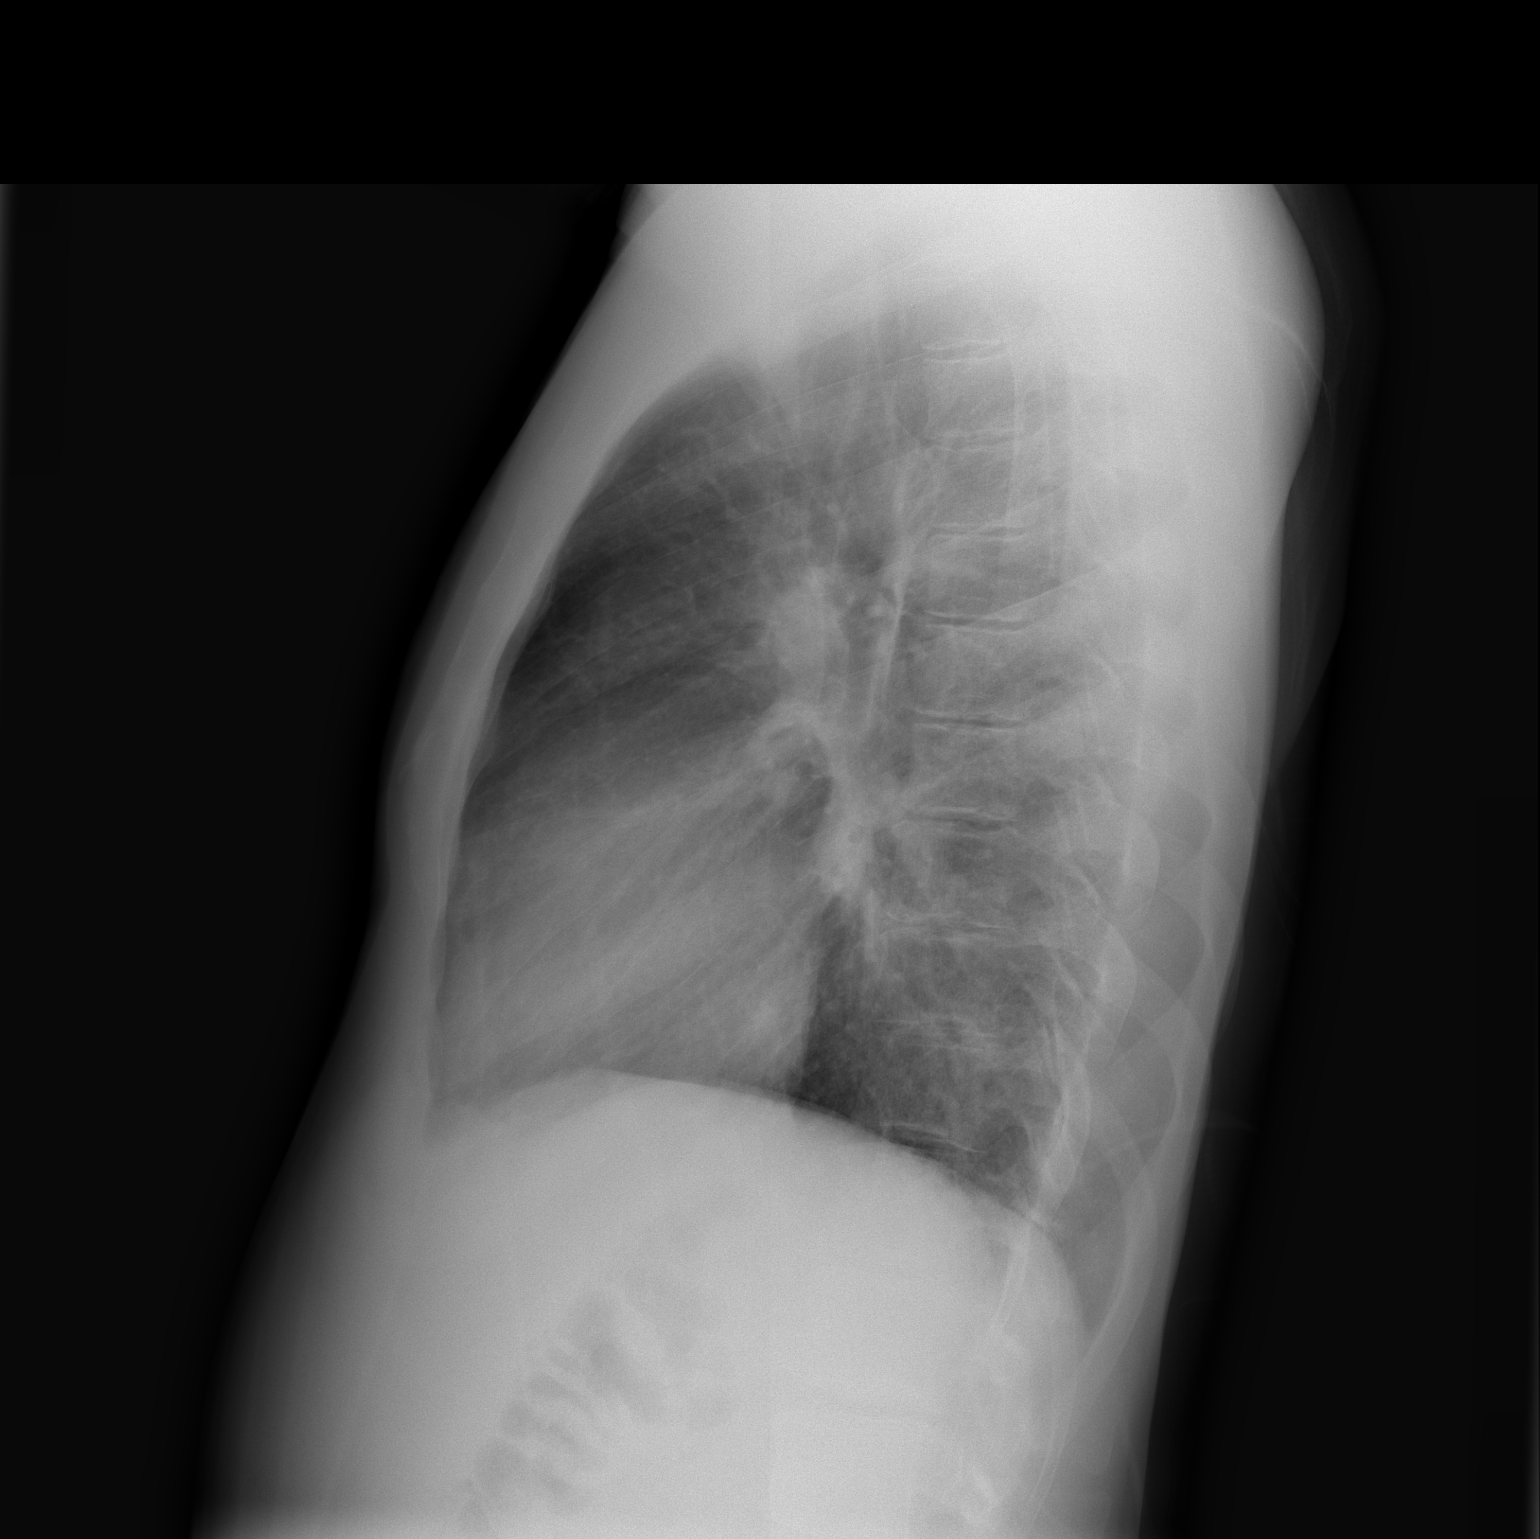

[2 of 2 positions shown; findings below may reference images not displayed]

FINDINGS: Cardiopericardial silhouette is upper limits of normal.  Lungs are clear.  Visualized soft tissues and bony thorax are unremarkable
IMPRESSION: No acute cardiopulmonary disease.

## 2009-01-31 ENCOUNTER — Emergency Department (HOSPITAL_COMMUNITY): Admission: EM | Admit: 2009-01-31 | Discharge: 2009-01-31 | Payer: Self-pay | Admitting: Emergency Medicine

## 2009-02-12 IMAGING — CR DG CHEST 2V
2 series · 2 of 2 positions shown · non-contrast
Comparison: 03/06/06.

CLINICAL DATA: Nonsmoker with one-day history of deep, dry cough and right-sided chest pain.  Mild temperature.  
 CHEST - 2 VIEW:

[w chest pa]
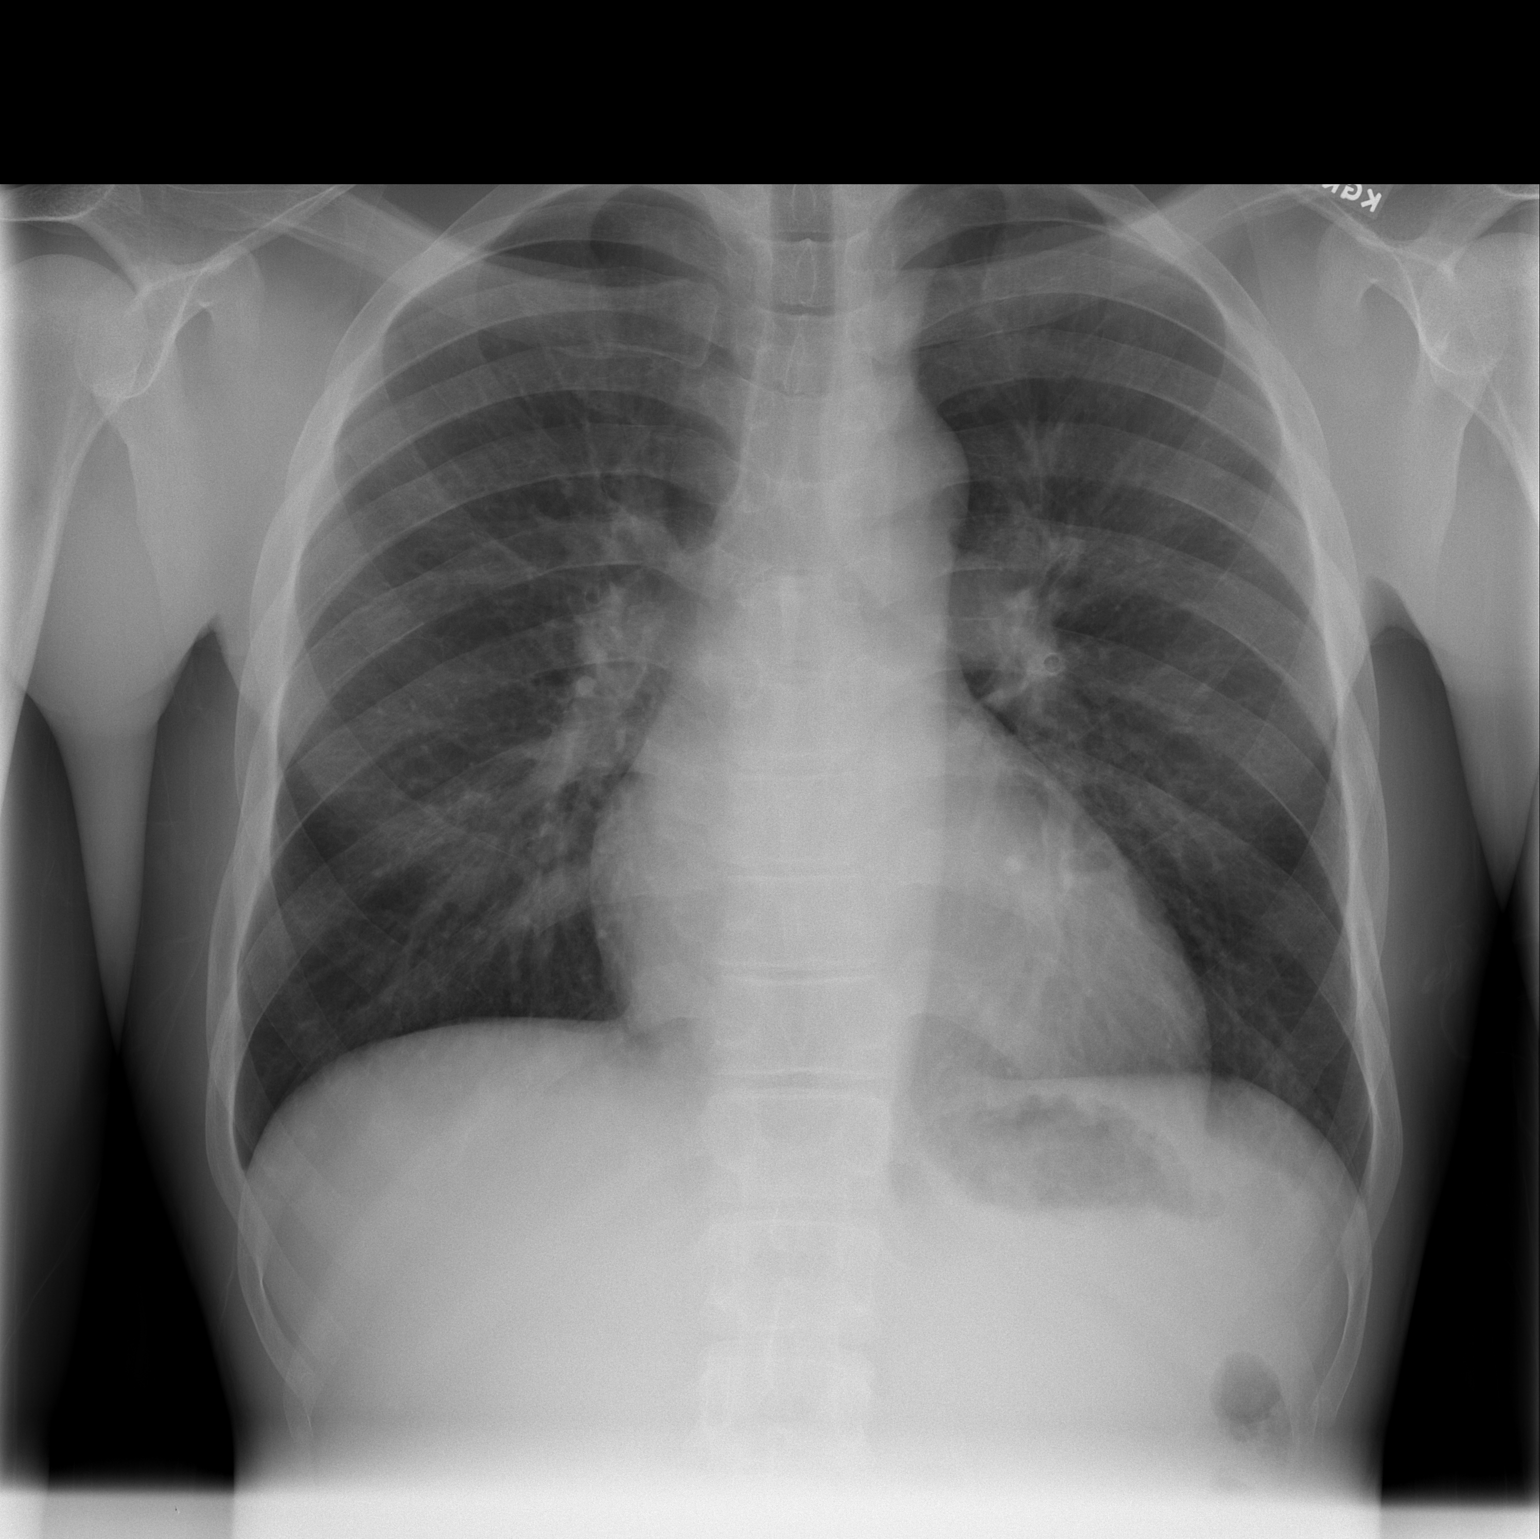

[w chest lat]
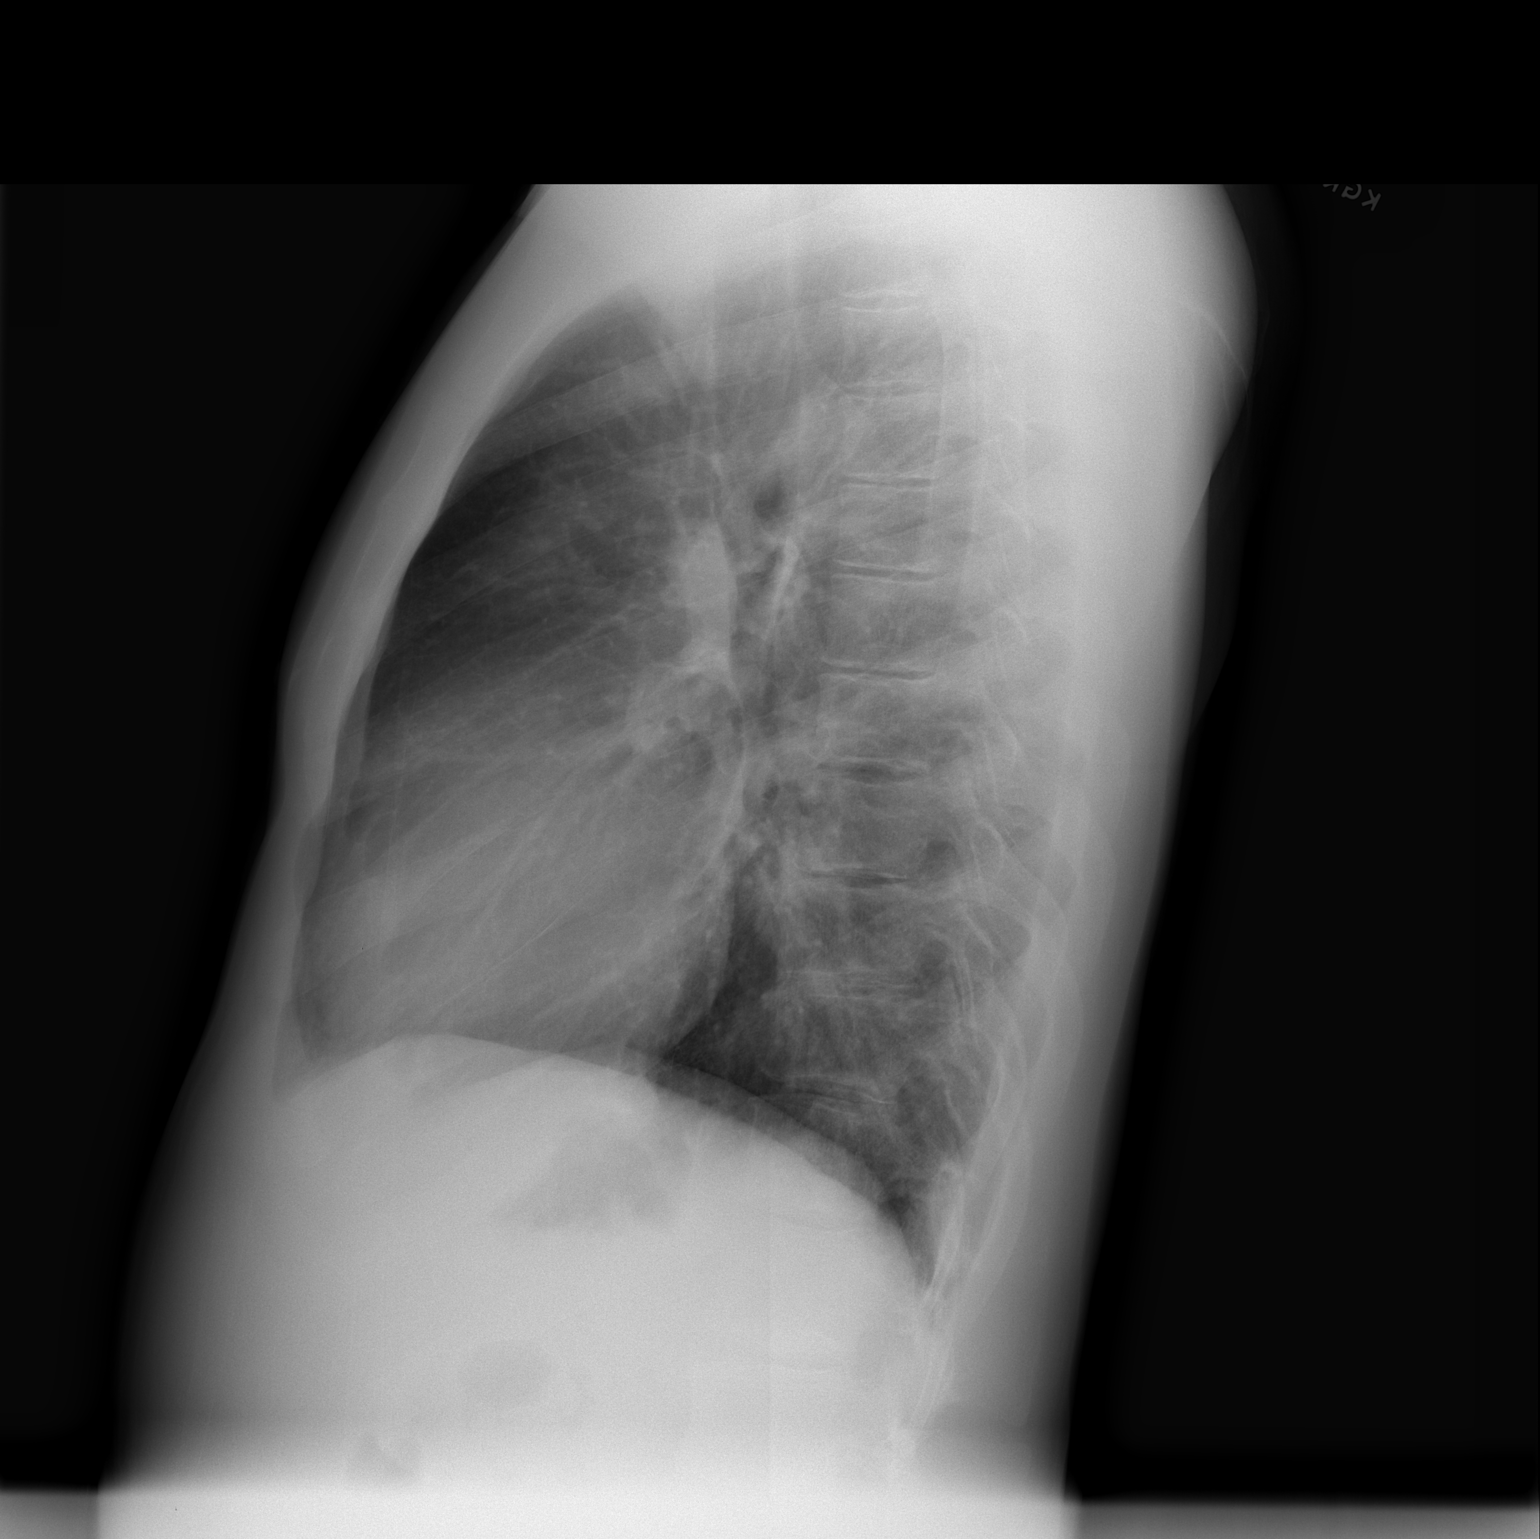

[2 of 2 positions shown; findings below may reference images not displayed]

FINDINGS: Heart size is mildly enlarged.  There are no effusions or edema.  There is a questionable developing opacity at the right medial lung base.  The remaining portions of the lungs are clear.  Review of the visualized osseous structures is unremarkable.
IMPRESSION: Equivocal area of developing opacification at the medial right lung base may represent early pneumonia.

## 2009-02-16 ENCOUNTER — Emergency Department (HOSPITAL_COMMUNITY): Admission: EM | Admit: 2009-02-16 | Discharge: 2009-02-16 | Payer: Self-pay | Admitting: Emergency Medicine

## 2009-03-17 IMAGING — CR DG KNEE COMPLETE 4+V*R*
4 series · 4 of 4 positions shown · non-contrast
Comparison: none

CLINICAL DATA: Right knee and ankle pain and decreased range of motion.  
 RIGHT KNEE ? 4 VIEW:

[t knee ap right]
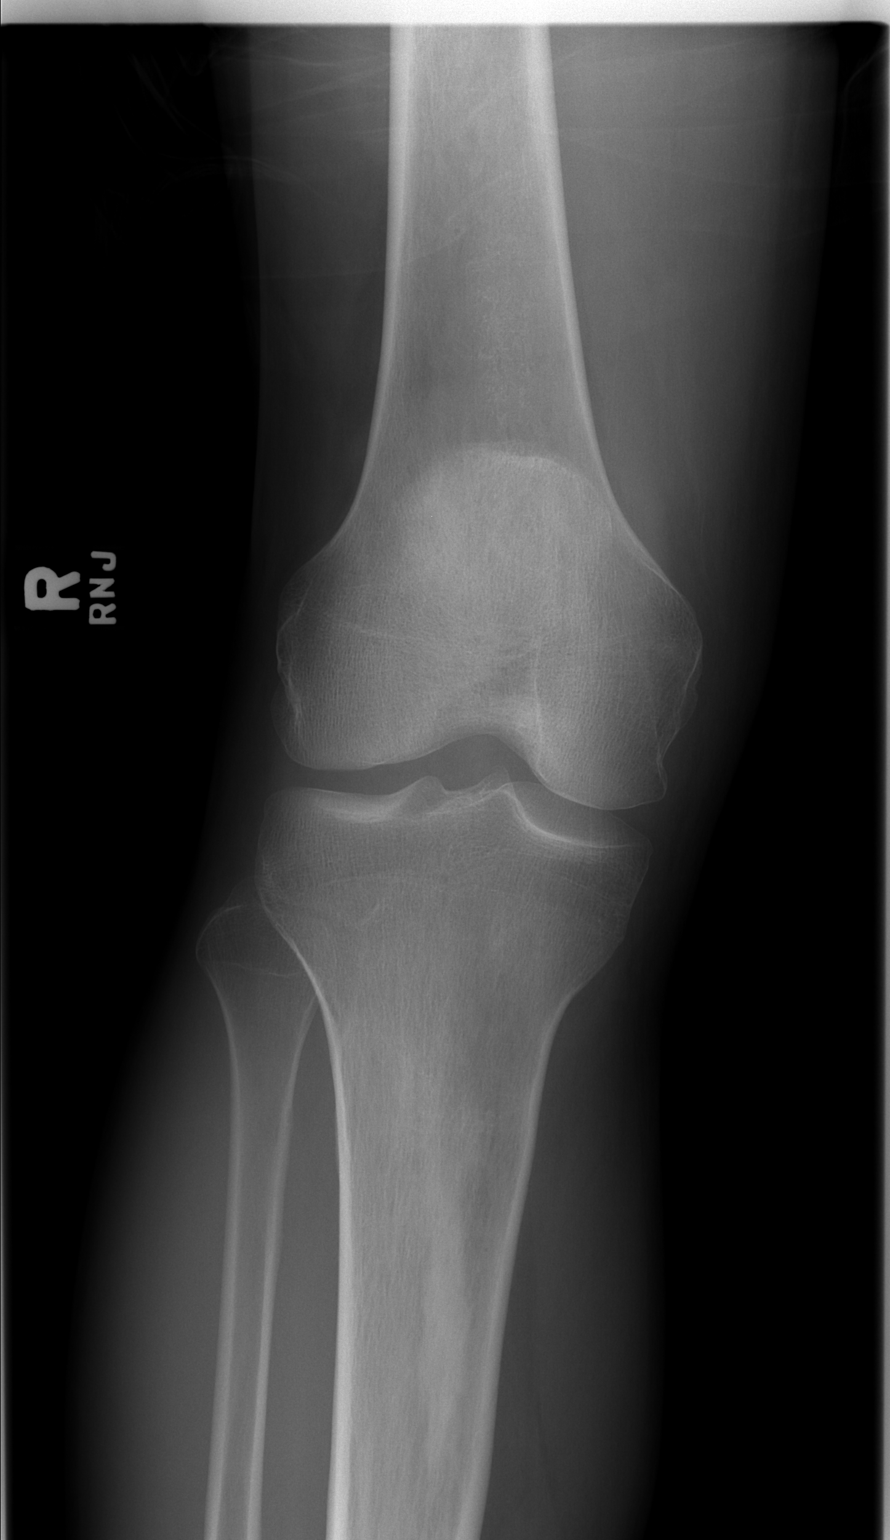

[t knee oblique right (1 of 2)]
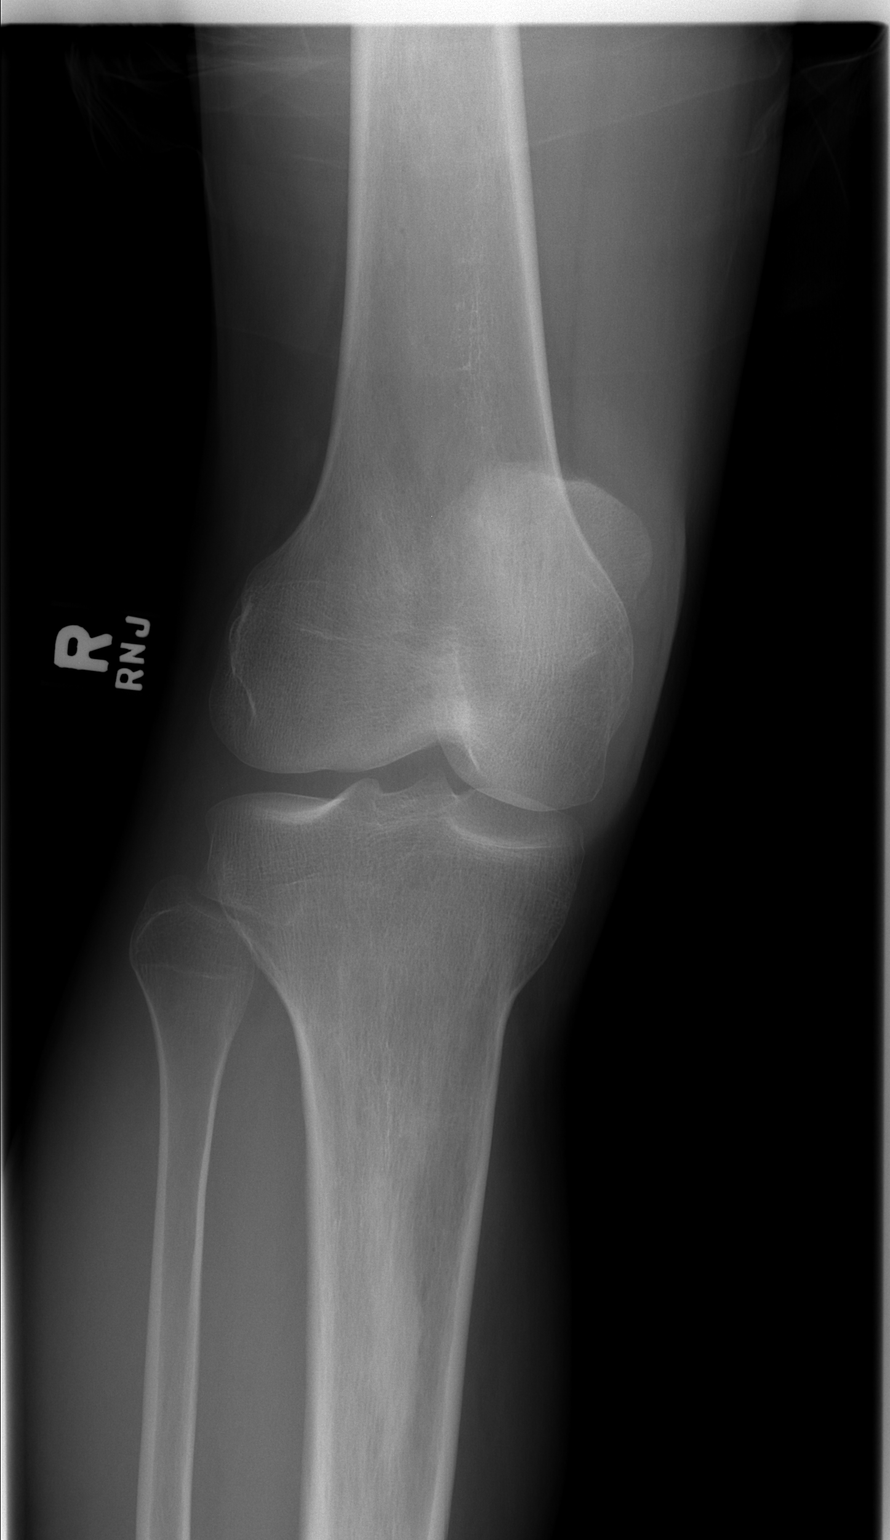

[t knee oblique right (2 of 2)]
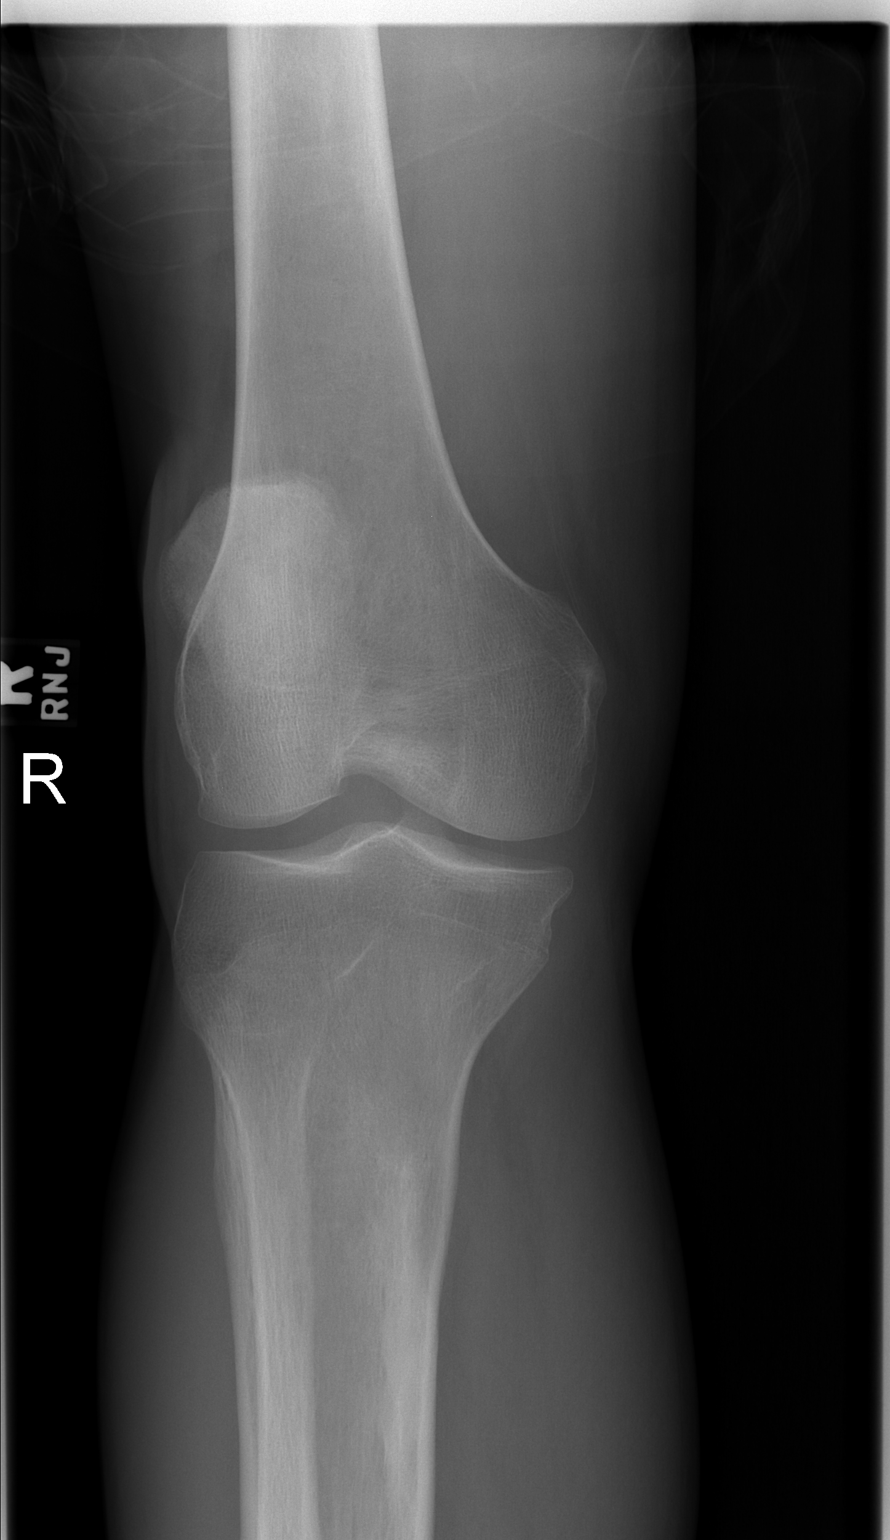

[t knee lat right]
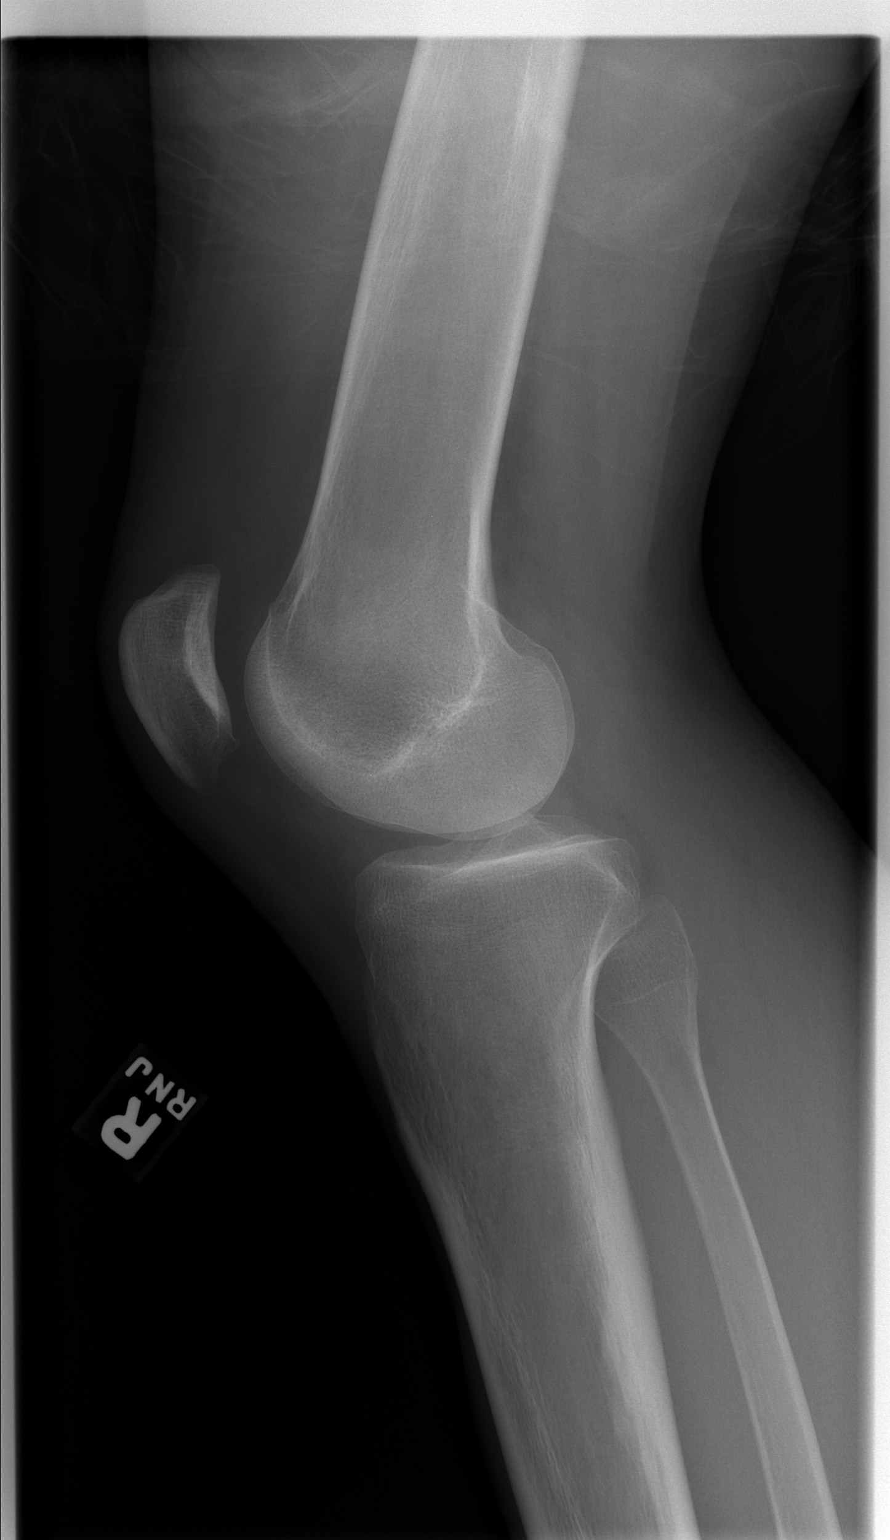

[4 of 4 positions shown; findings below may reference images not displayed]

FINDINGS: There is no evidence of fracture, dislocation, or joint effusion.  There is no evidence of arthropathy or other focal bone abnormality.  Soft tissues are unremarkable.
IMPRESSION: Negative.
 RIGHT ANKLE ? 3 VIEW:
FINDINGS: There is no evidence of fracture, dislocation, or joint effusion.  There is no evidence of arthropathy or other focal bone abnormality.  Soft tissues are unremarkable.
IMPRESSION: Negative.

## 2009-03-17 IMAGING — CR DG ANKLE COMPLETE 3+V*R*
3 series · 3 of 3 positions shown · non-contrast
Comparison: none

CLINICAL DATA: Right knee and ankle pain and decreased range of motion.  
 RIGHT KNEE ? 4 VIEW:

[t ankle joint lat right]
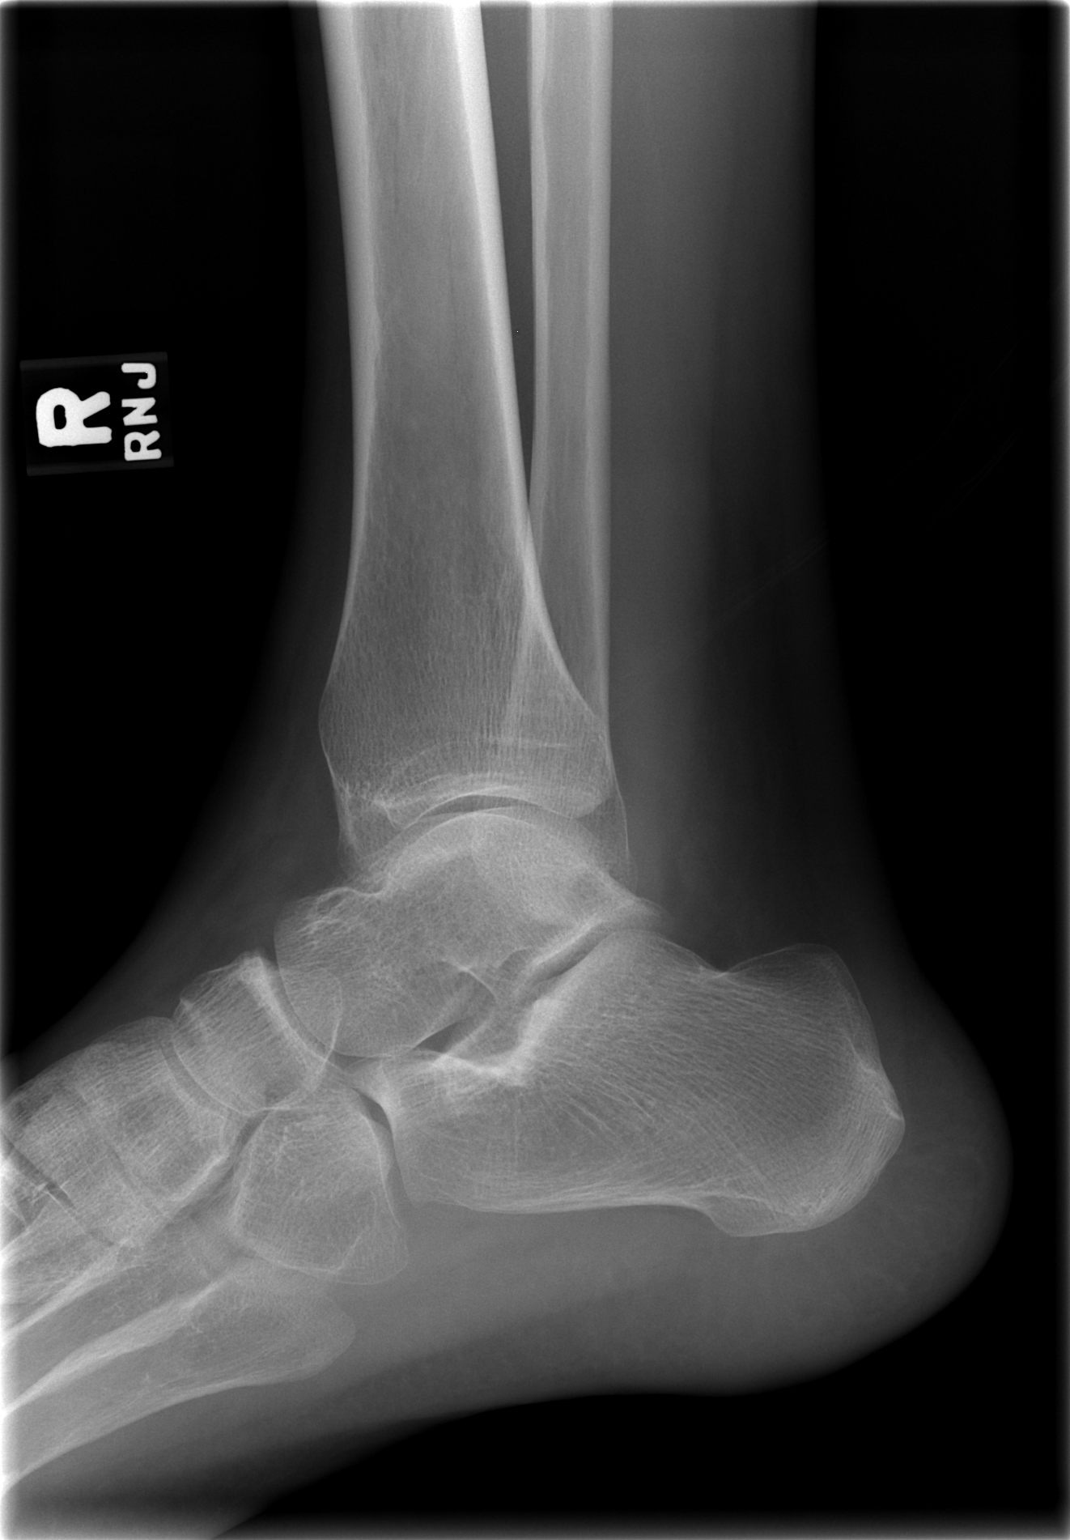

[t ankle joint ap right]
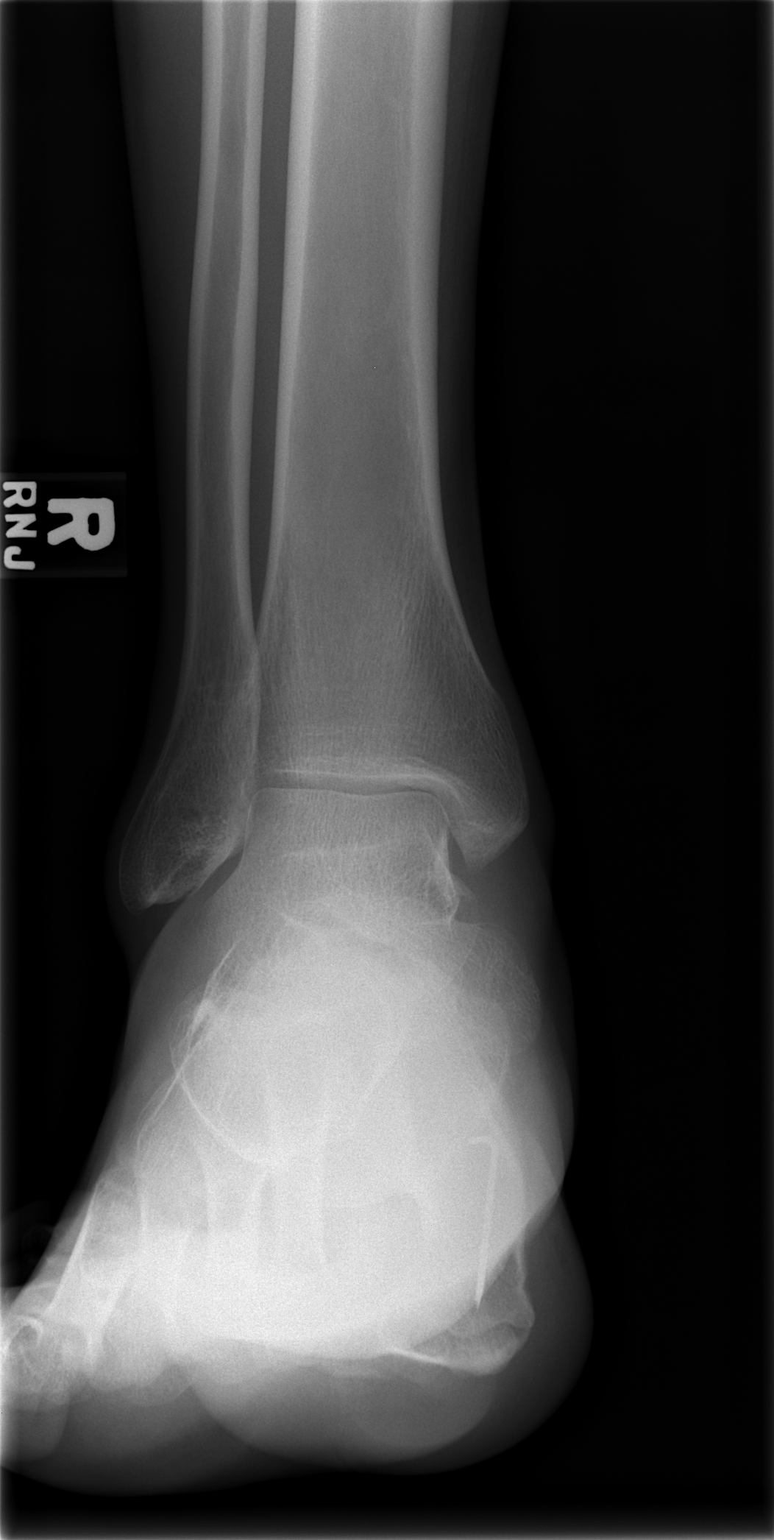

[t ankle joint oblique right]
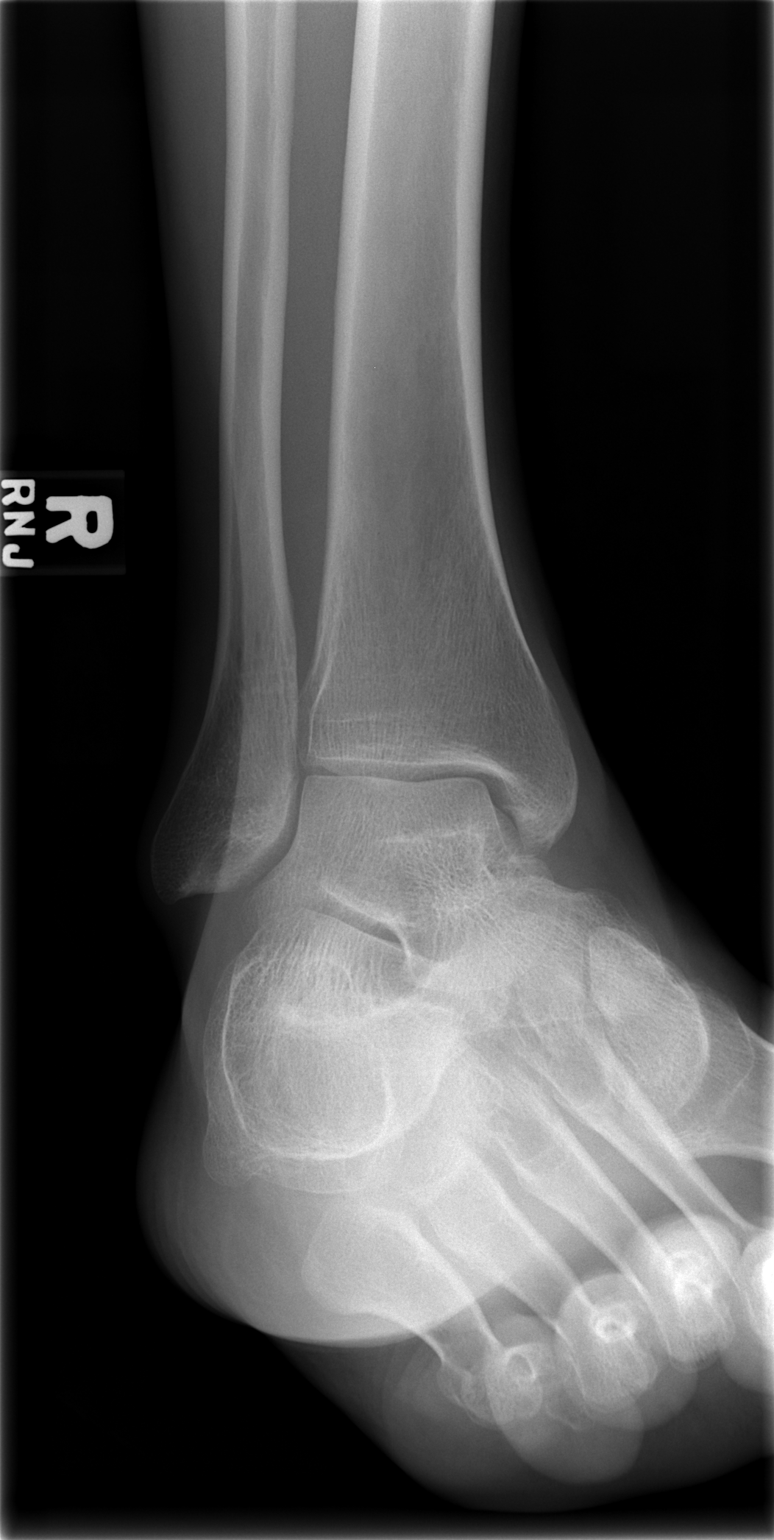

[3 of 3 positions shown; findings below may reference images not displayed]

FINDINGS: There is no evidence of fracture, dislocation, or joint effusion.  There is no evidence of arthropathy or other focal bone abnormality.  Soft tissues are unremarkable.
IMPRESSION: Negative.
 RIGHT ANKLE ? 3 VIEW:
FINDINGS: There is no evidence of fracture, dislocation, or joint effusion.  There is no evidence of arthropathy or other focal bone abnormality.  Soft tissues are unremarkable.
IMPRESSION: Negative.

## 2009-03-19 ENCOUNTER — Emergency Department (HOSPITAL_COMMUNITY): Admission: EM | Admit: 2009-03-19 | Discharge: 2009-03-19 | Payer: Self-pay | Admitting: Emergency Medicine

## 2009-04-10 IMAGING — CR DG CHEST 2V
2 series · 2 of 2 positions shown · non-contrast
Comparison: 03/22/2006.

Exam: Chest, 2 views.

HISTORY: Sickle cell crisis.

[w chest pa]
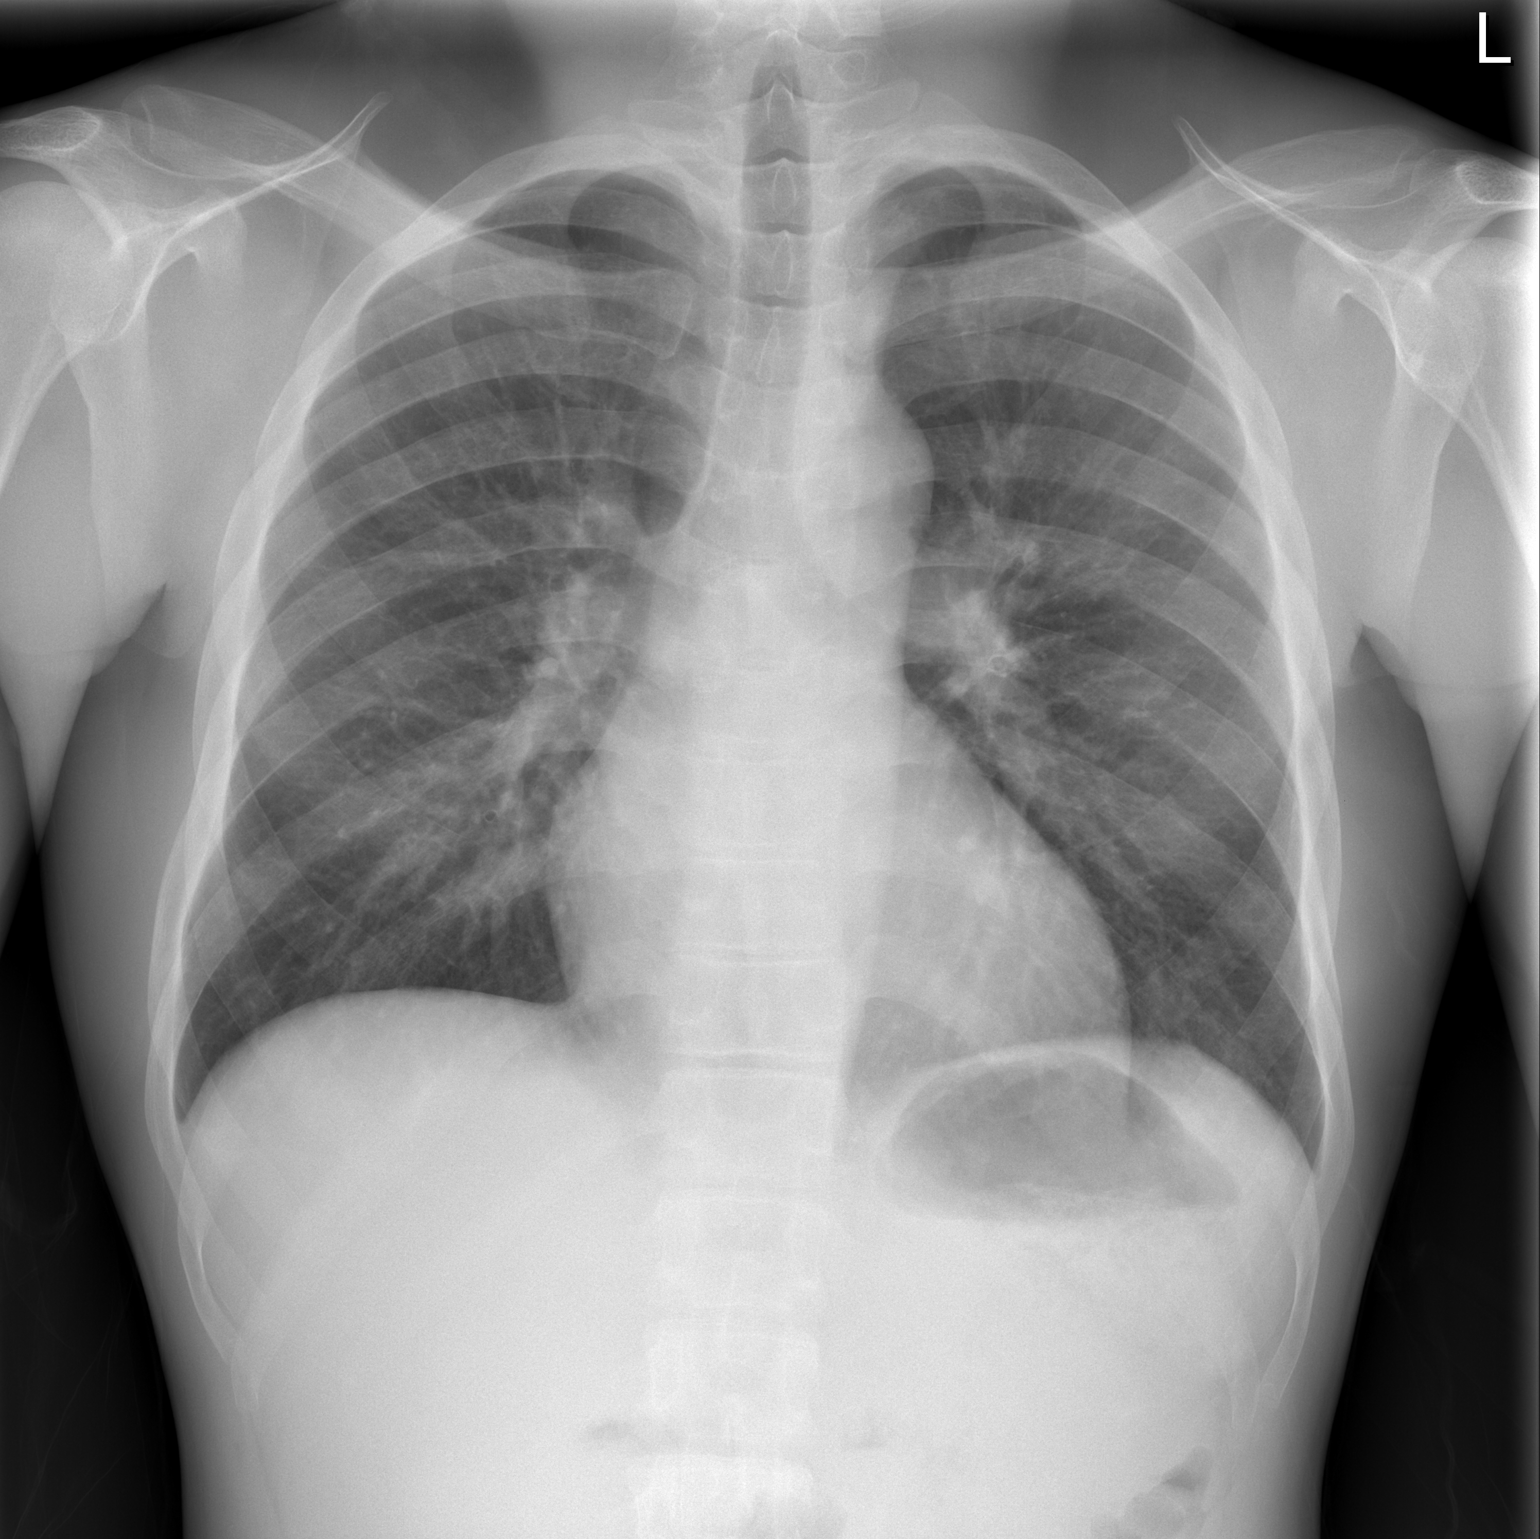

[w chest lat]
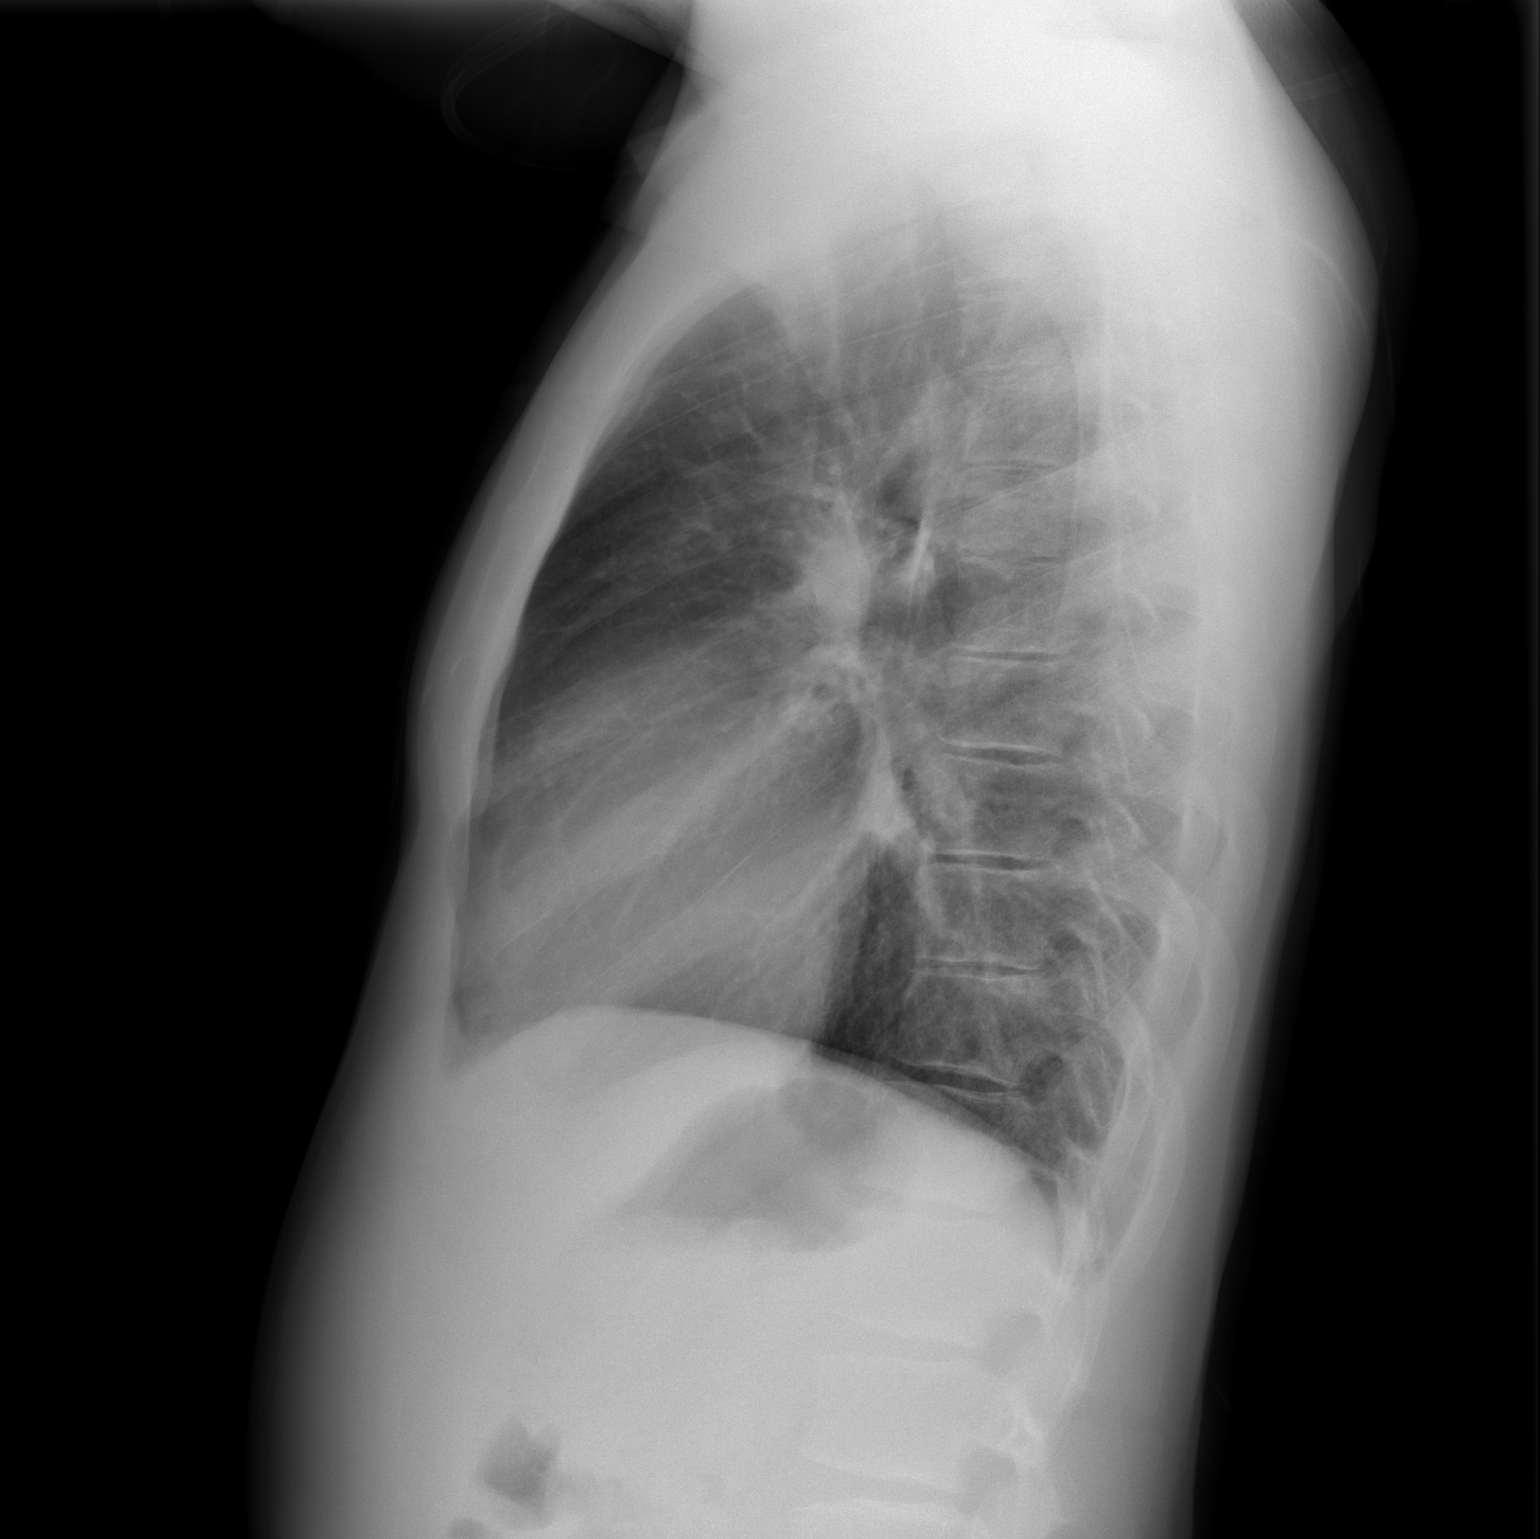

[2 of 2 positions shown; findings below may reference images not displayed]

FINDINGS: Heart size is mildly enlarged.

No effusions or edema.

Chronic interstitial markings in the left upper lobe and right base likely
represents scarring.

Review of the visualized osseous structures unremarkable
IMPRESSION: 1. Mild cardiac enlargement without failure.

2. Chronic scarring without acute superimposed airspace disease

## 2009-04-20 ENCOUNTER — Inpatient Hospital Stay (HOSPITAL_COMMUNITY): Admission: EM | Admit: 2009-04-20 | Discharge: 2009-05-01 | Payer: Self-pay | Admitting: Emergency Medicine

## 2009-05-19 IMAGING — CR DG CHEST 2V
2 series · 2 of 2 positions shown · non-contrast
Comparison: none

CLINICAL DATA: Sickle cell disease, shortness of breath, chest pain

Chest 2 view:
Comparison 05/18/2006. Mild enlargement of cardiac silhouette. Coarse linear
right perihilar opacities stable. No confluent airspace infiltrates or overt
edema. No effusion. Visualized bones unremarkable.

[w chest pa]
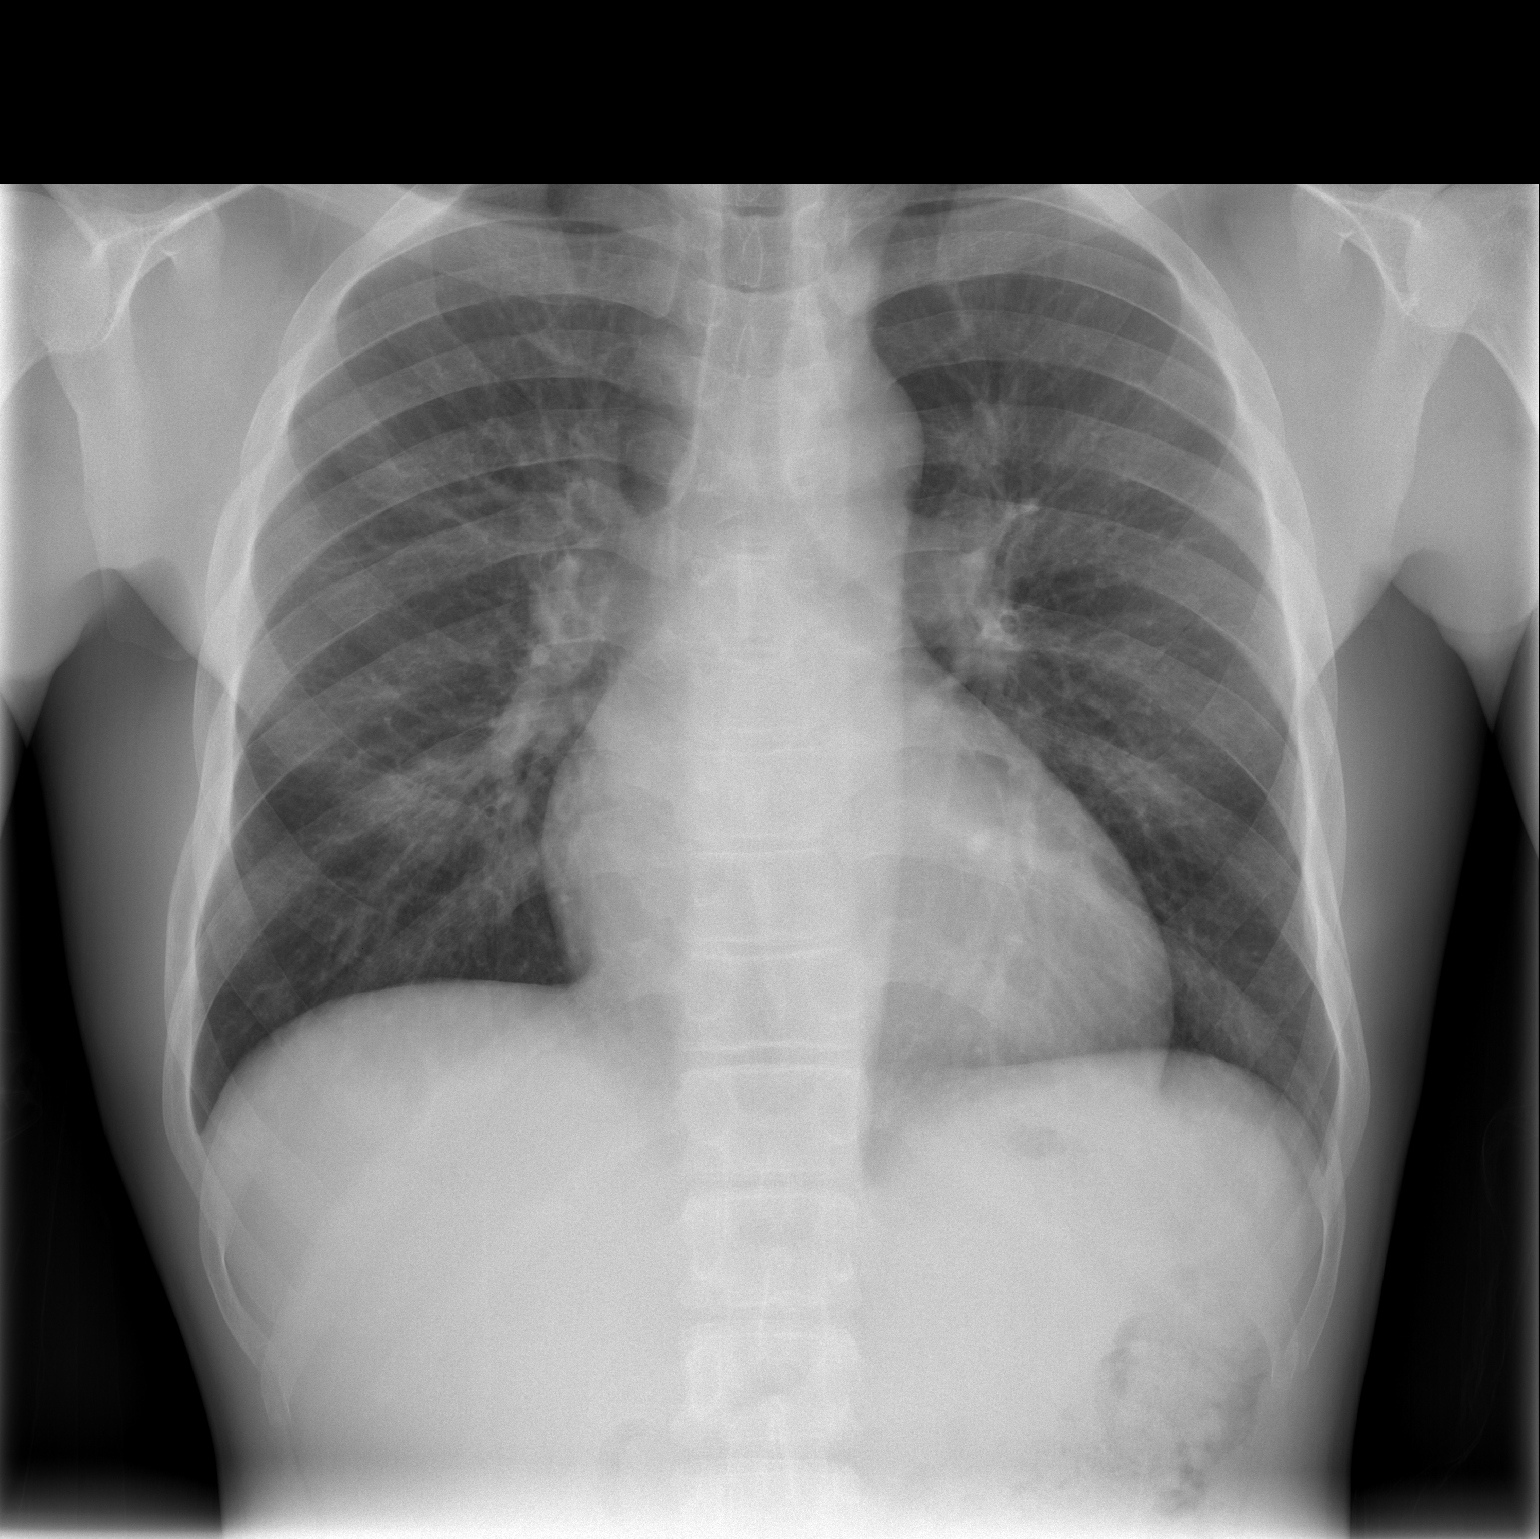

[w chest lat]
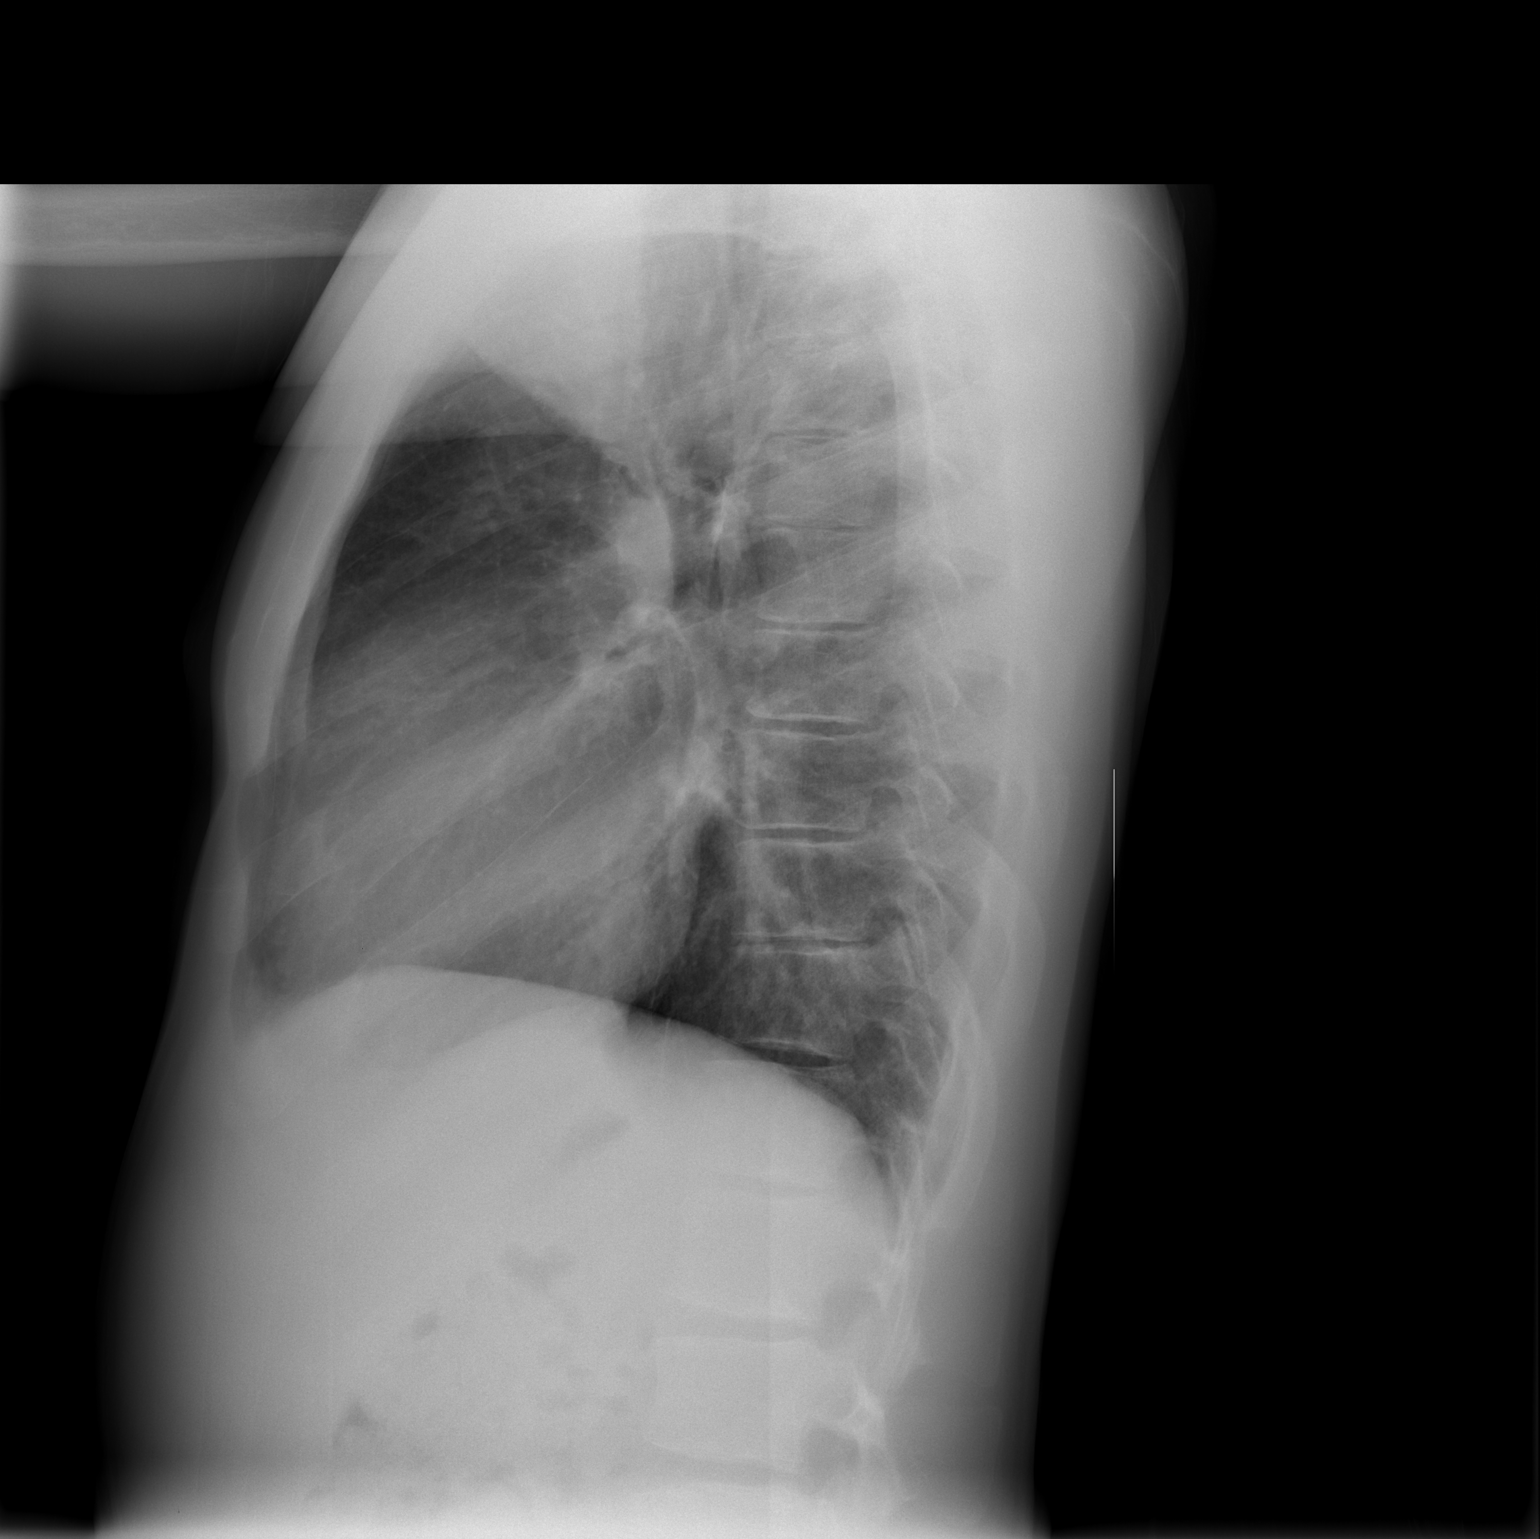

[2 of 2 positions shown; findings below may reference images not displayed]

IMPRESSION: 1. No acute  disease.
2. Stable mild cardiomegaly
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
## 2009-05-27 ENCOUNTER — Inpatient Hospital Stay (HOSPITAL_COMMUNITY): Admission: EM | Admit: 2009-05-27 | Discharge: 2009-06-08 | Payer: Self-pay | Admitting: Emergency Medicine

## 2009-06-06 IMAGING — CT CT PELVIS W/O CM
1 series · 15 of 32 positions shown, 19 images · IV contrast (agent unspecified)
Comparison: 11/26/04.

CLINICAL DATA: 26 year-old male, sickle cell pain. Right lower quadrant pain x several days.
 ABDOMEN CT WITHOUT CONTRAST:
TECHNIQUE: Multidetector CT imaging of the abdomen was performed following the standard protocol without IV contrast.
TECHNIQUE: Multidetector CT imaging of the pelvis was performed following the standard protocol without IV contrast.

[Series 2: stone_wo 5.0 b40f st · axial · 0.62mm/px · z∈[-421,-61]mm · 15 of 101 slices shown, 19 images]
[im 7/101  soft-tissue]
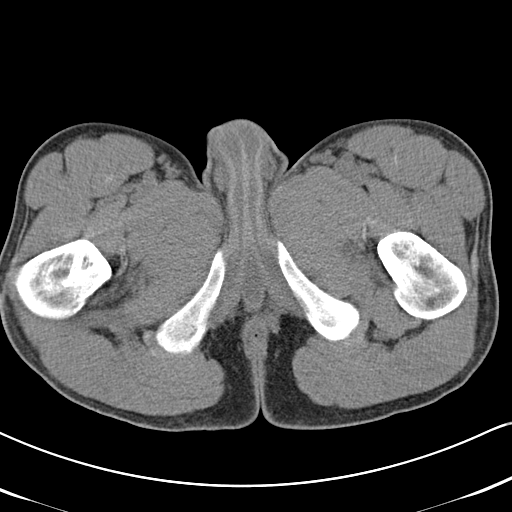
[im 7/101  bone]
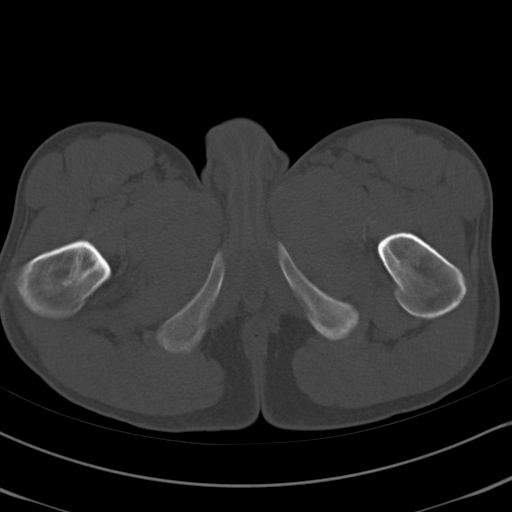
[im 13/101  soft-tissue]
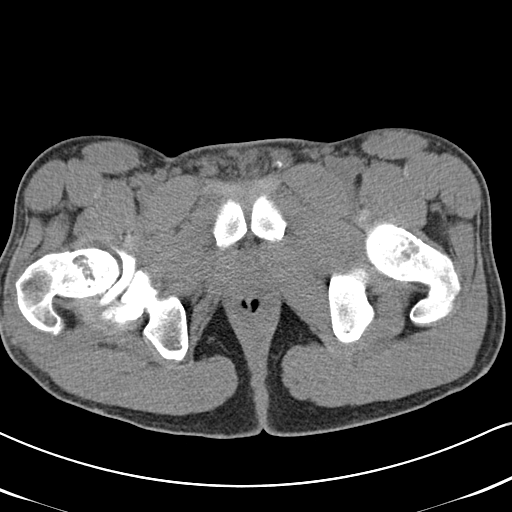
[im 20/101  soft-tissue]
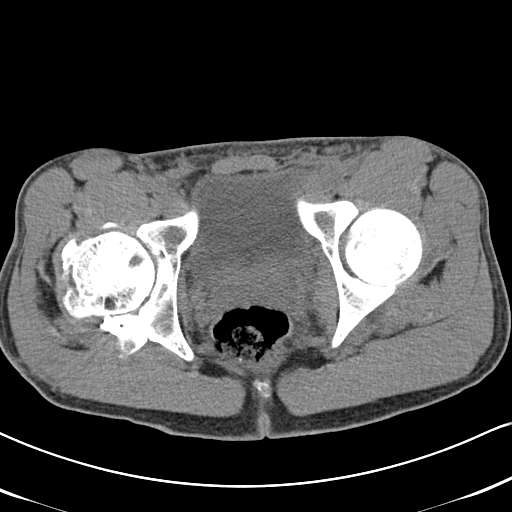
[im 30/101  soft-tissue]
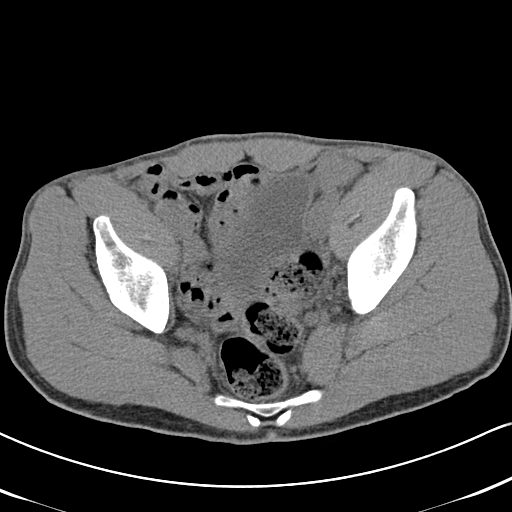
[im 36/101  soft-tissue]
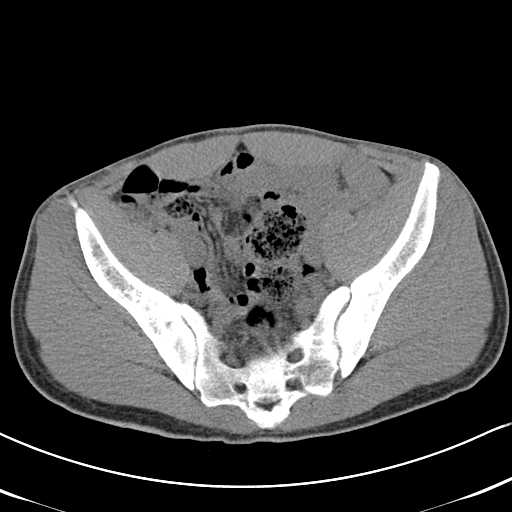
[im 42/101  soft-tissue]
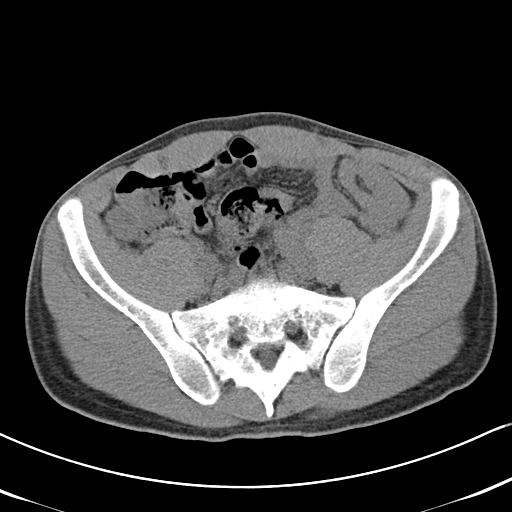
[im 52/101  soft-tissue]
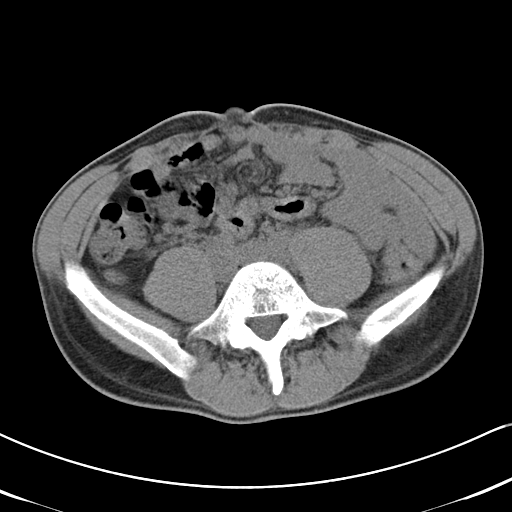
[im 59/101  soft-tissue]
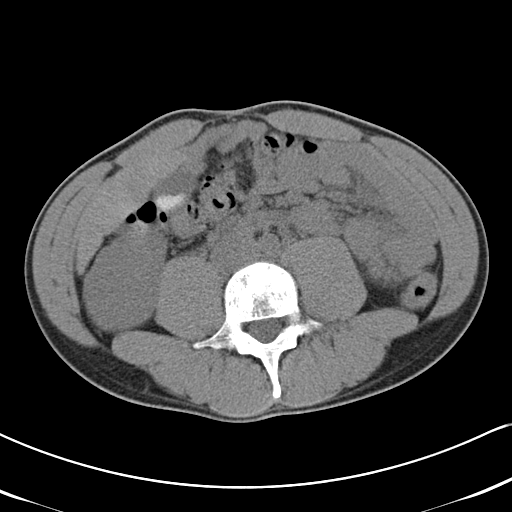
[im 65/101  soft-tissue]
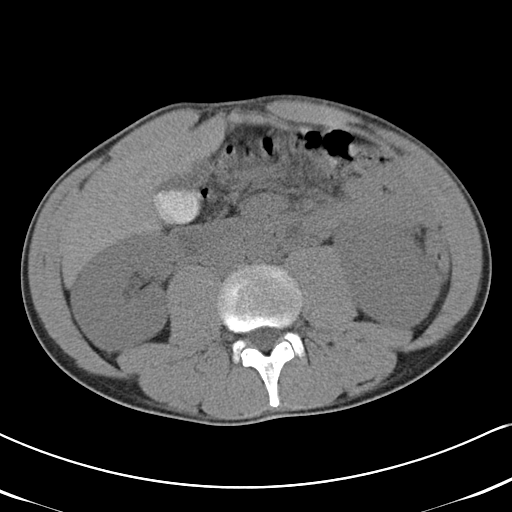
[im 65/101  bone]
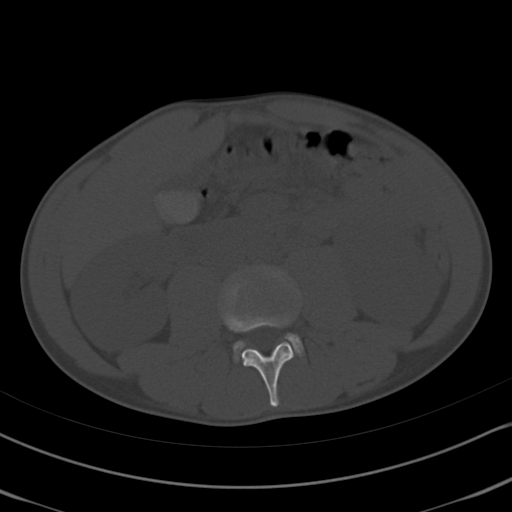
[im 71/101  soft-tissue]
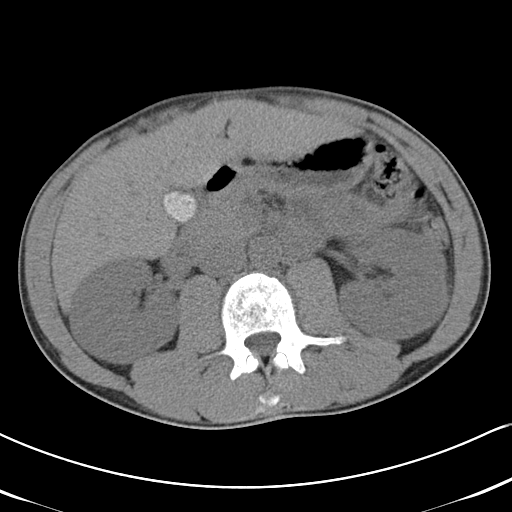
[im 81/101  soft-tissue]
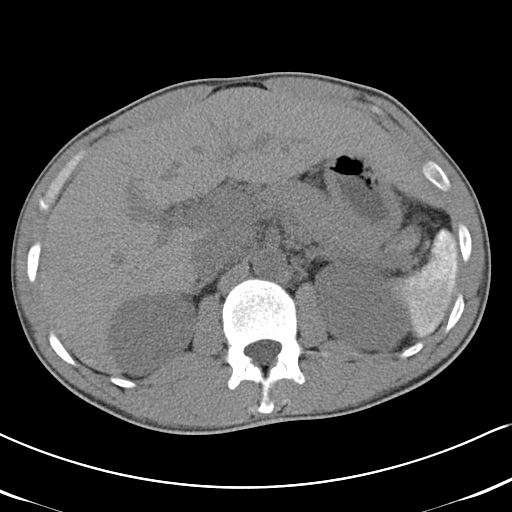
[im 88/101  soft-tissue]
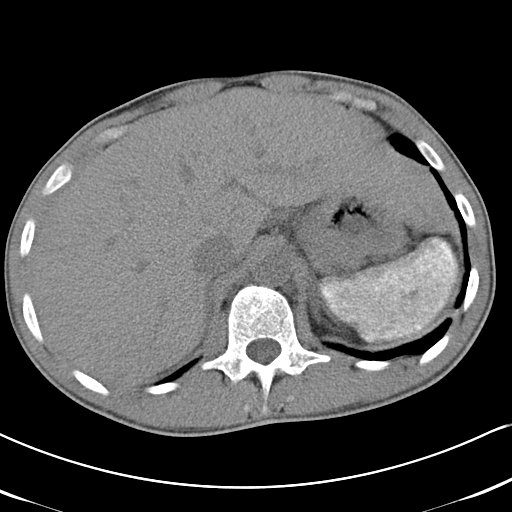
[im 88/101  lung]
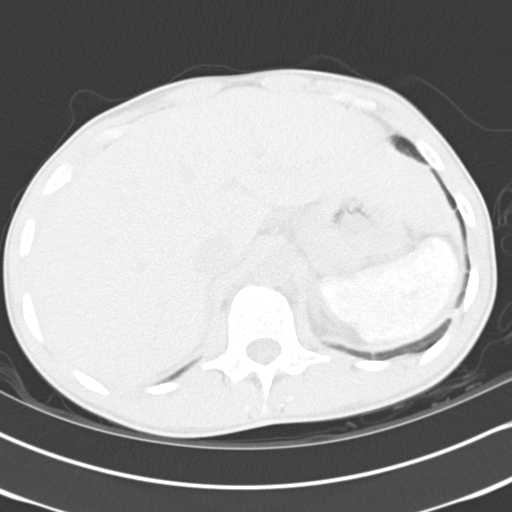
[im 91/101  lung]
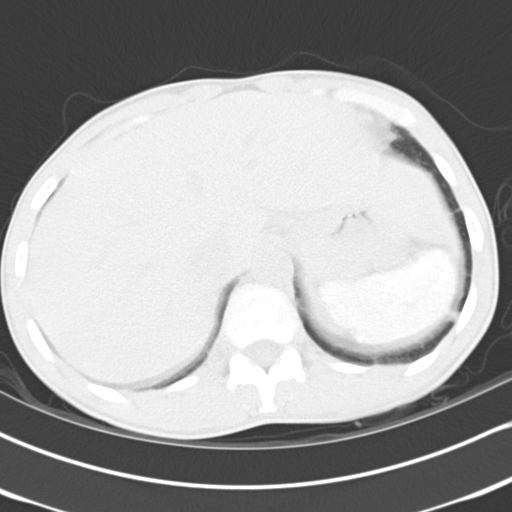
[im 94/101  soft-tissue]
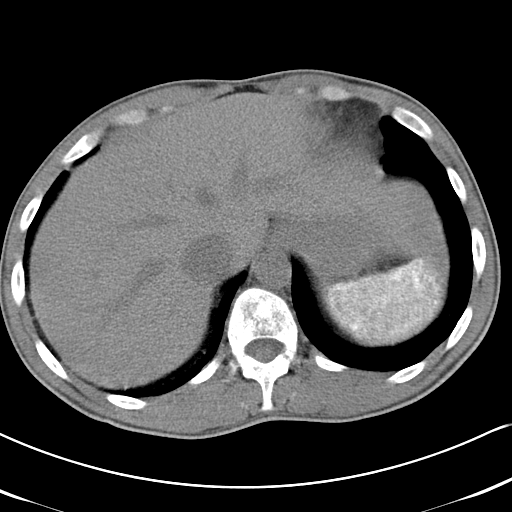
[im 94/101  lung]
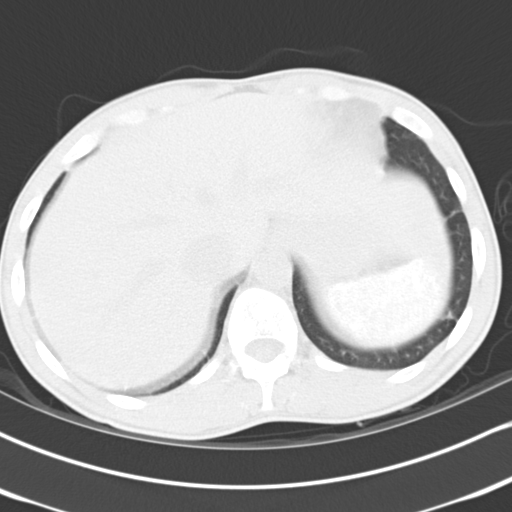
[im 97/101  lung]
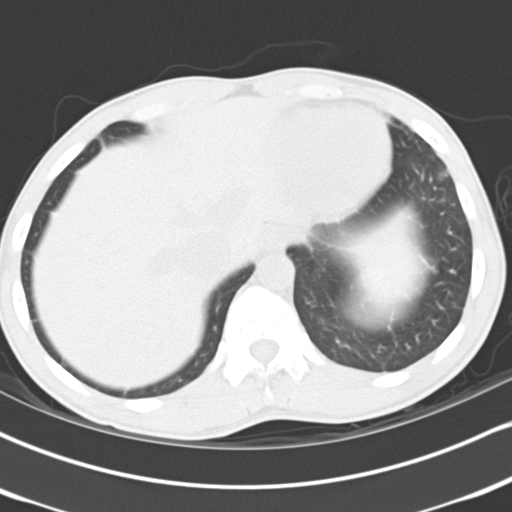

[15 of 32 positions shown; findings below may reference images not displayed]

FINDINGS: There is minimal nodular density in the left lower lobe likely reflecting atelectasis. This is not present on the prior exam. The uninfused appearance of the liver is unremarkable.  Multiple gallstones are redemonstrated without  inflammatory change to suggest cholecystitis. The spleen is dense compatible with chronic infarction. There is significant low density preservation.  Pancreas is unremarkable.  Adrenal glands within normal limits bilaterally.  The kidneys demonstrate no focal stones. There is no hydronephrosis. It is very difficult to follow ureters throughout their course due to relatively minimal body and peritoneal fat.  No calcifications are seen along the expected course of the ureters.  
 The small bowel is unremarkable. Bone windows demonstrate multiple areas of sclerosis compatible with bone infarcts.
IMPRESSION: 1.  No evidence for renal stones. 
 2.  Chronic calcification of the spleen.
 3.  Chronic cholelithiasis without evidence for cholecystitis.
 PELVIS CT WITHOUT CONTRAST:
FINDINGS: No distal ureteral stones are seen.  There are no stones within the bladder. The rectosigmoid colon is unremarkable.  The appendix is at the upper limits of normal for size but has no focal inflammatory changes.  The patient is status post plate and screw fixation of the posterior aspect of the right acetabulum.  There is extensive avascular necrosis of the right femoral head.
IMPRESSION: 1.  No distal ureteral stones.
 2.  Extensive AVN of the right femoral head with postoperative changes of the posterior acetabulum.
 3.  Chronic mild dilatation of the appendix is stable.

## 2009-06-23 IMAGING — CR DG CHEST 2V
2 series · 2 of 2 positions shown · non-contrast
Comparison: 07/03/2006

CLINICAL DATA: Osteoarthritis of right hip. 
 CHEST - 2 VIEW:

[view not recorded (1 of 2)]
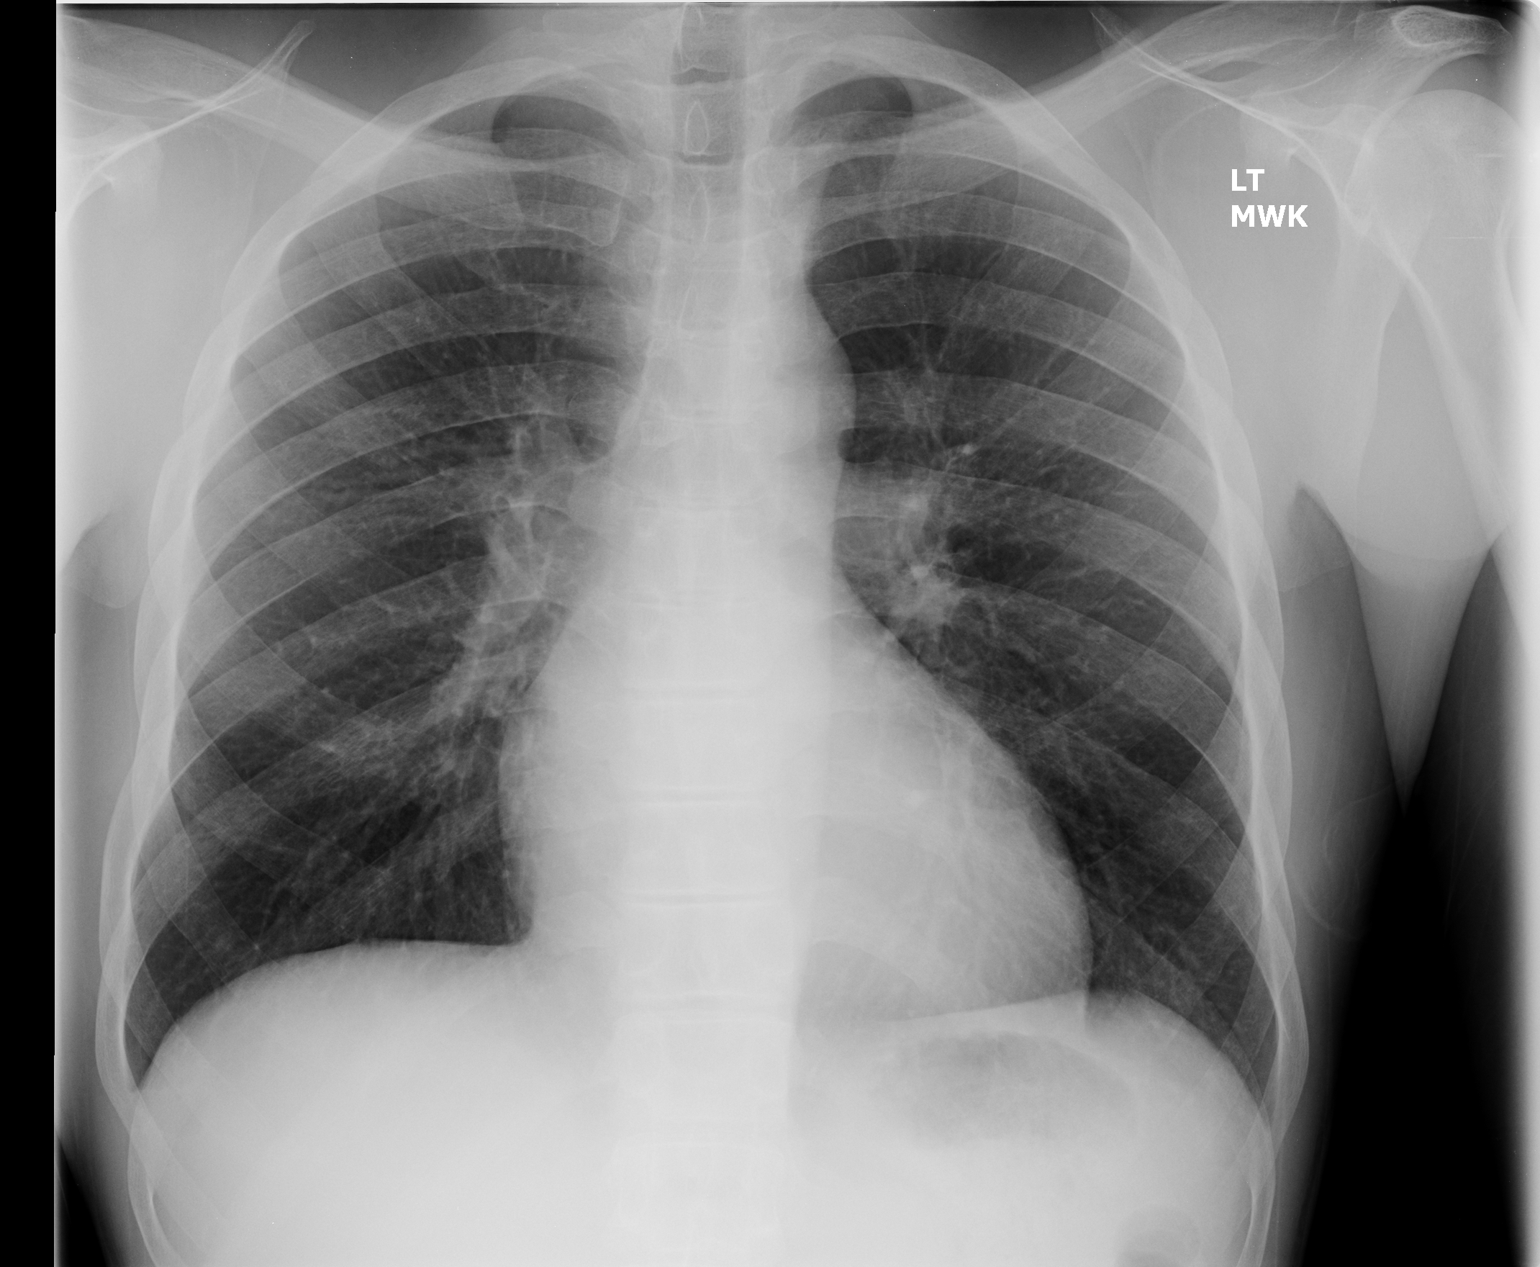

[view not recorded (2 of 2)]
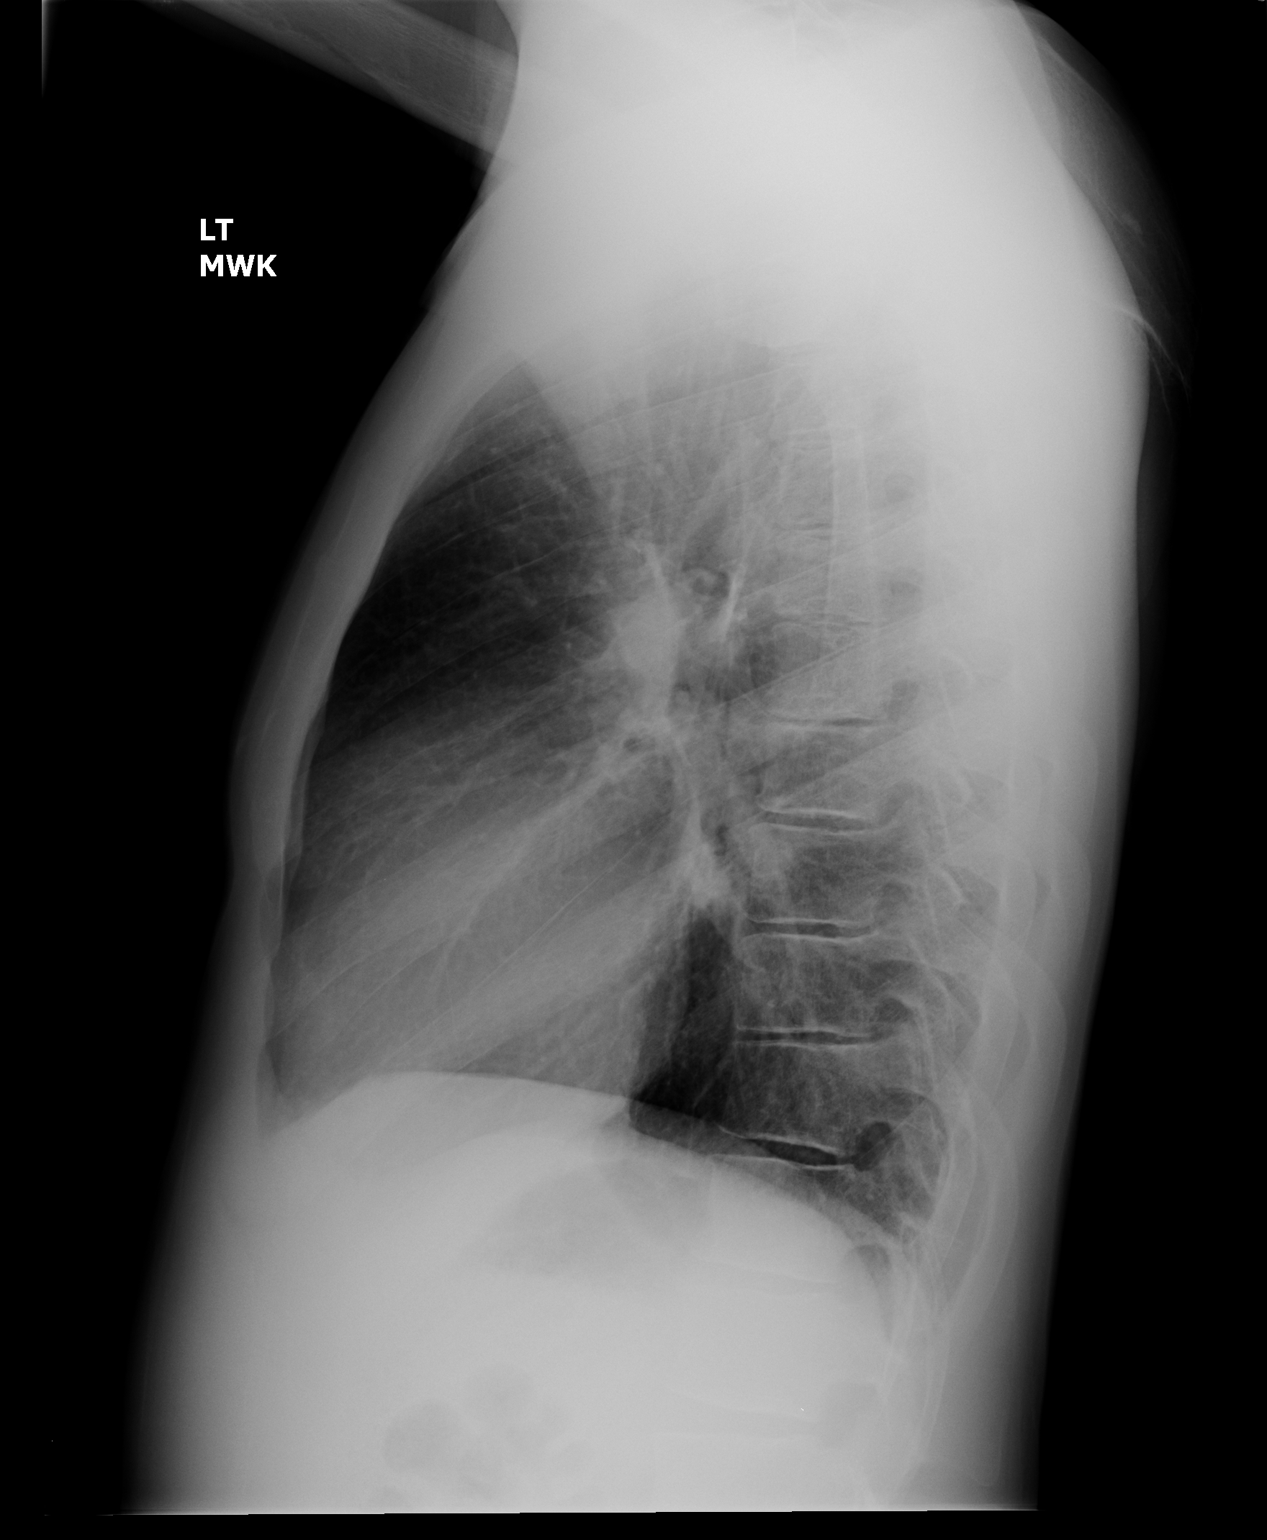

[2 of 2 positions shown; findings below may reference images not displayed]

FINDINGS: PA and lateral views reveal the heart size to remain prominent although there is a narrow AP diameter of the chest. Perihilar markings do remain accentuated without active findings.
IMPRESSION: Stable chest. No active disease.

## 2009-06-26 IMAGING — CR DG HIP COMPLETE 2+V*R*
2 series · 2 of 2 positions shown · non-contrast
Comparison: 02/24/2004.

Exam: Right hip, 2 views.

HISTORY: total hip replacement.

[view not recorded (1 of 2)]
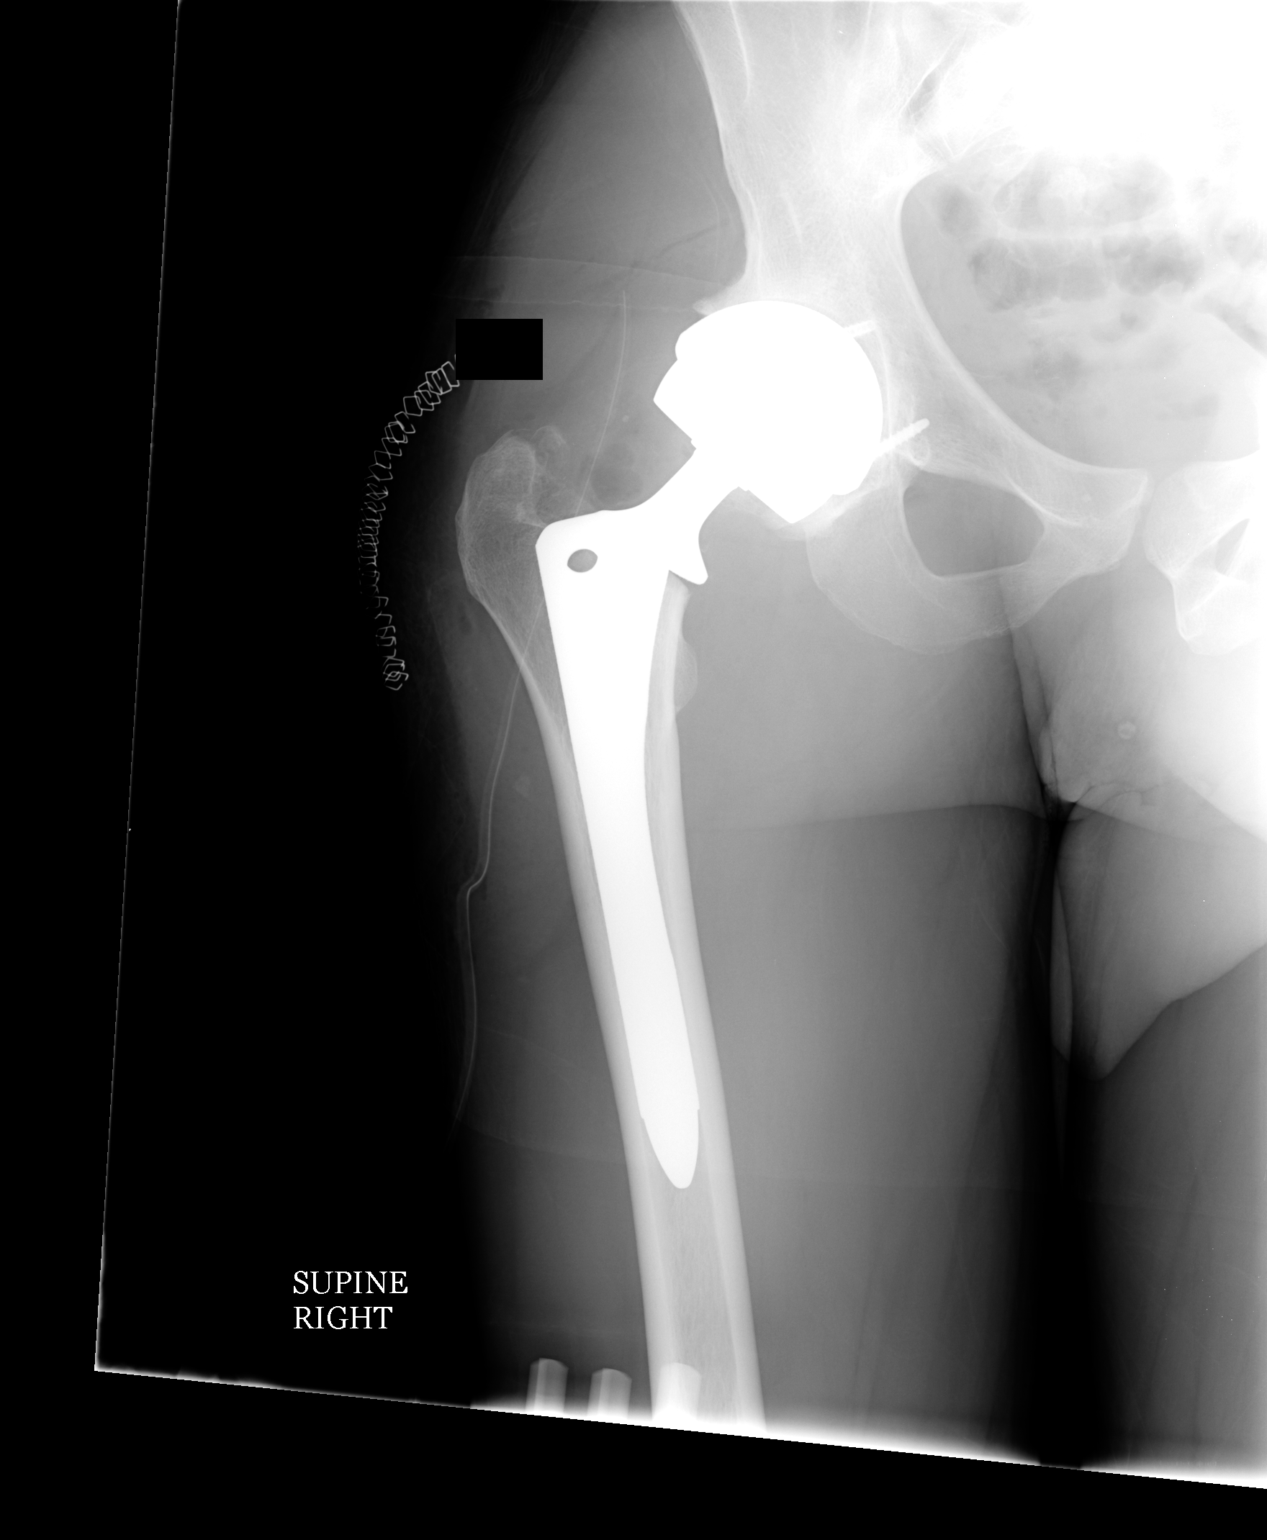

[view not recorded (2 of 2)]
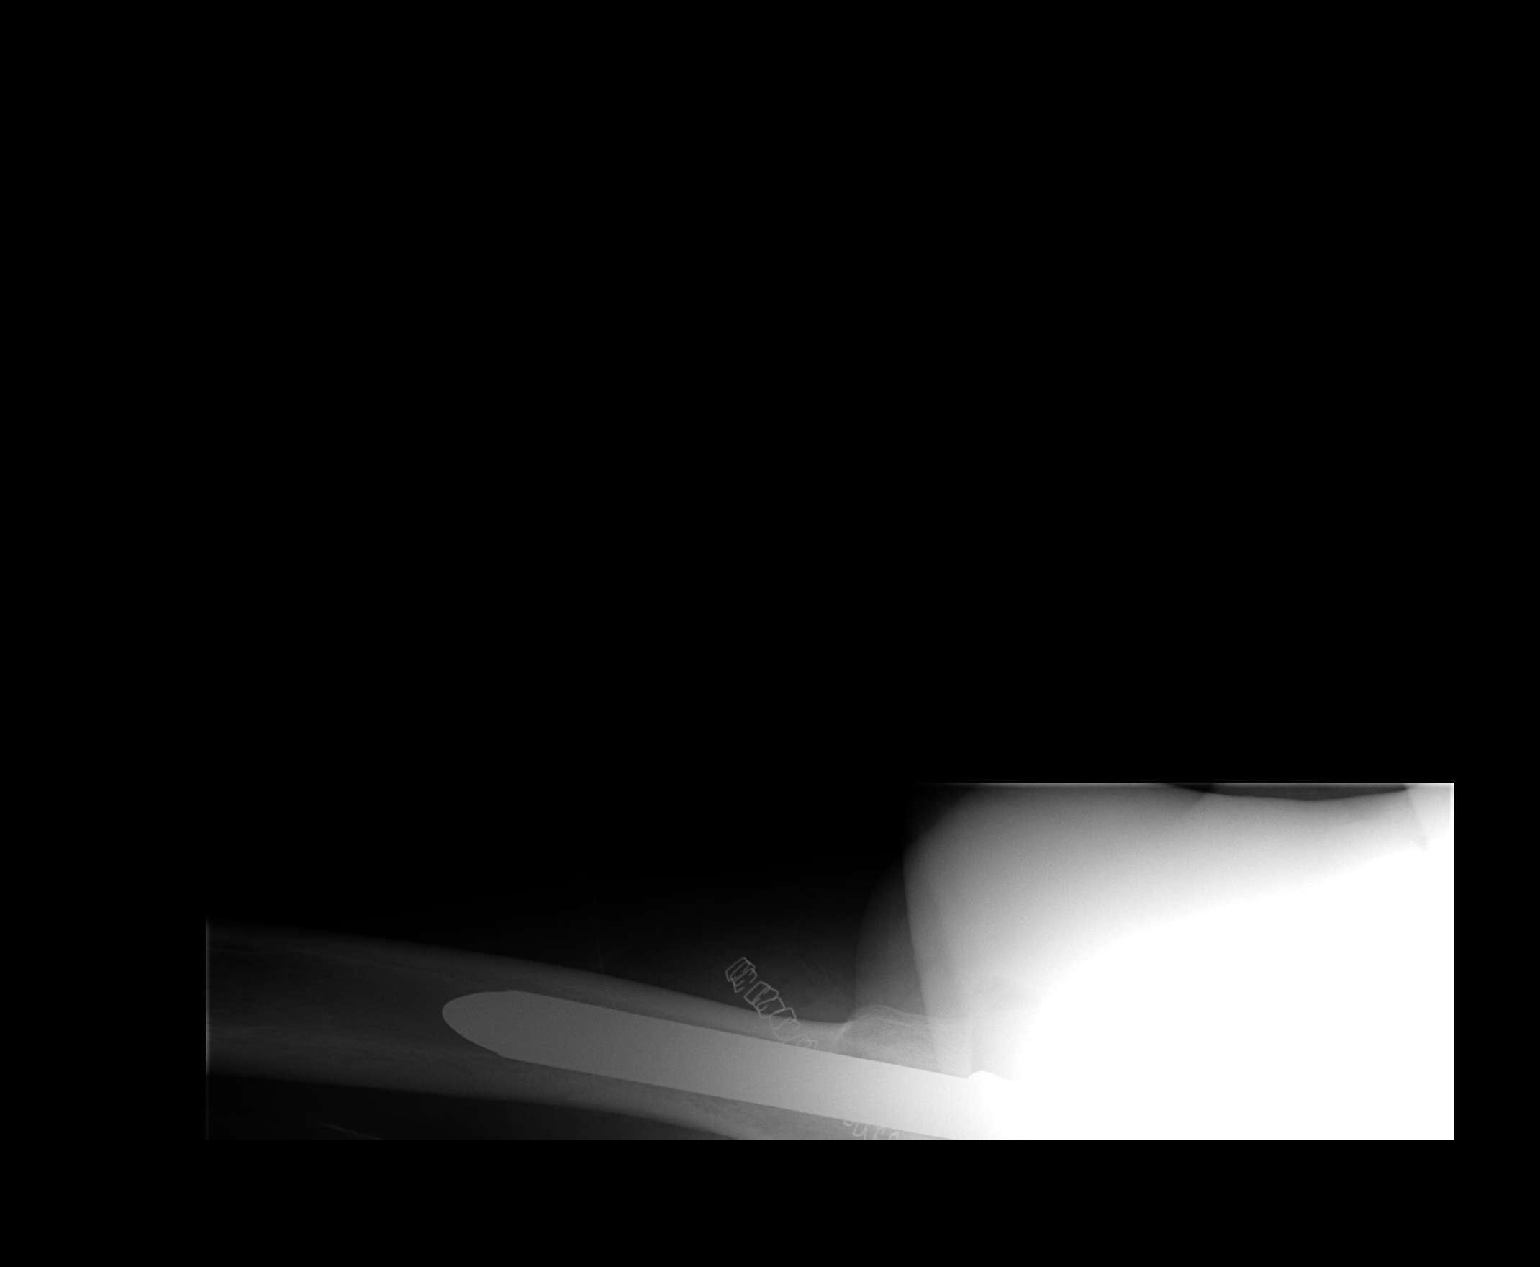

[2 of 2 positions shown; findings below may reference images not displayed]

FINDINGS: The right total hip arthroplasty device is noted.

The hardware components are in anatomic alignment. 

No complications are noted. Specifically no periprosthetic fracture.
IMPRESSION: 1. No complications status post right total hip arthroplasty.

## 2009-07-06 ENCOUNTER — Inpatient Hospital Stay (HOSPITAL_COMMUNITY): Admission: EM | Admit: 2009-07-06 | Discharge: 2009-07-09 | Payer: Self-pay | Admitting: Emergency Medicine

## 2009-08-04 ENCOUNTER — Emergency Department (HOSPITAL_COMMUNITY): Admission: EM | Admit: 2009-08-04 | Discharge: 2009-08-04 | Payer: Self-pay | Admitting: Emergency Medicine

## 2009-08-06 ENCOUNTER — Inpatient Hospital Stay (HOSPITAL_COMMUNITY): Admission: EM | Admit: 2009-08-06 | Discharge: 2009-08-15 | Payer: Self-pay | Admitting: Emergency Medicine

## 2009-08-11 IMAGING — CR DG CHEST 2V
2 series · 2 of 2 positions shown · non-contrast
Comparison: 07/31/2006

CLINICAL DATA: Sickle cell, smoker, chest pain

CHEST - 2 VIEW:

[w chest pa]
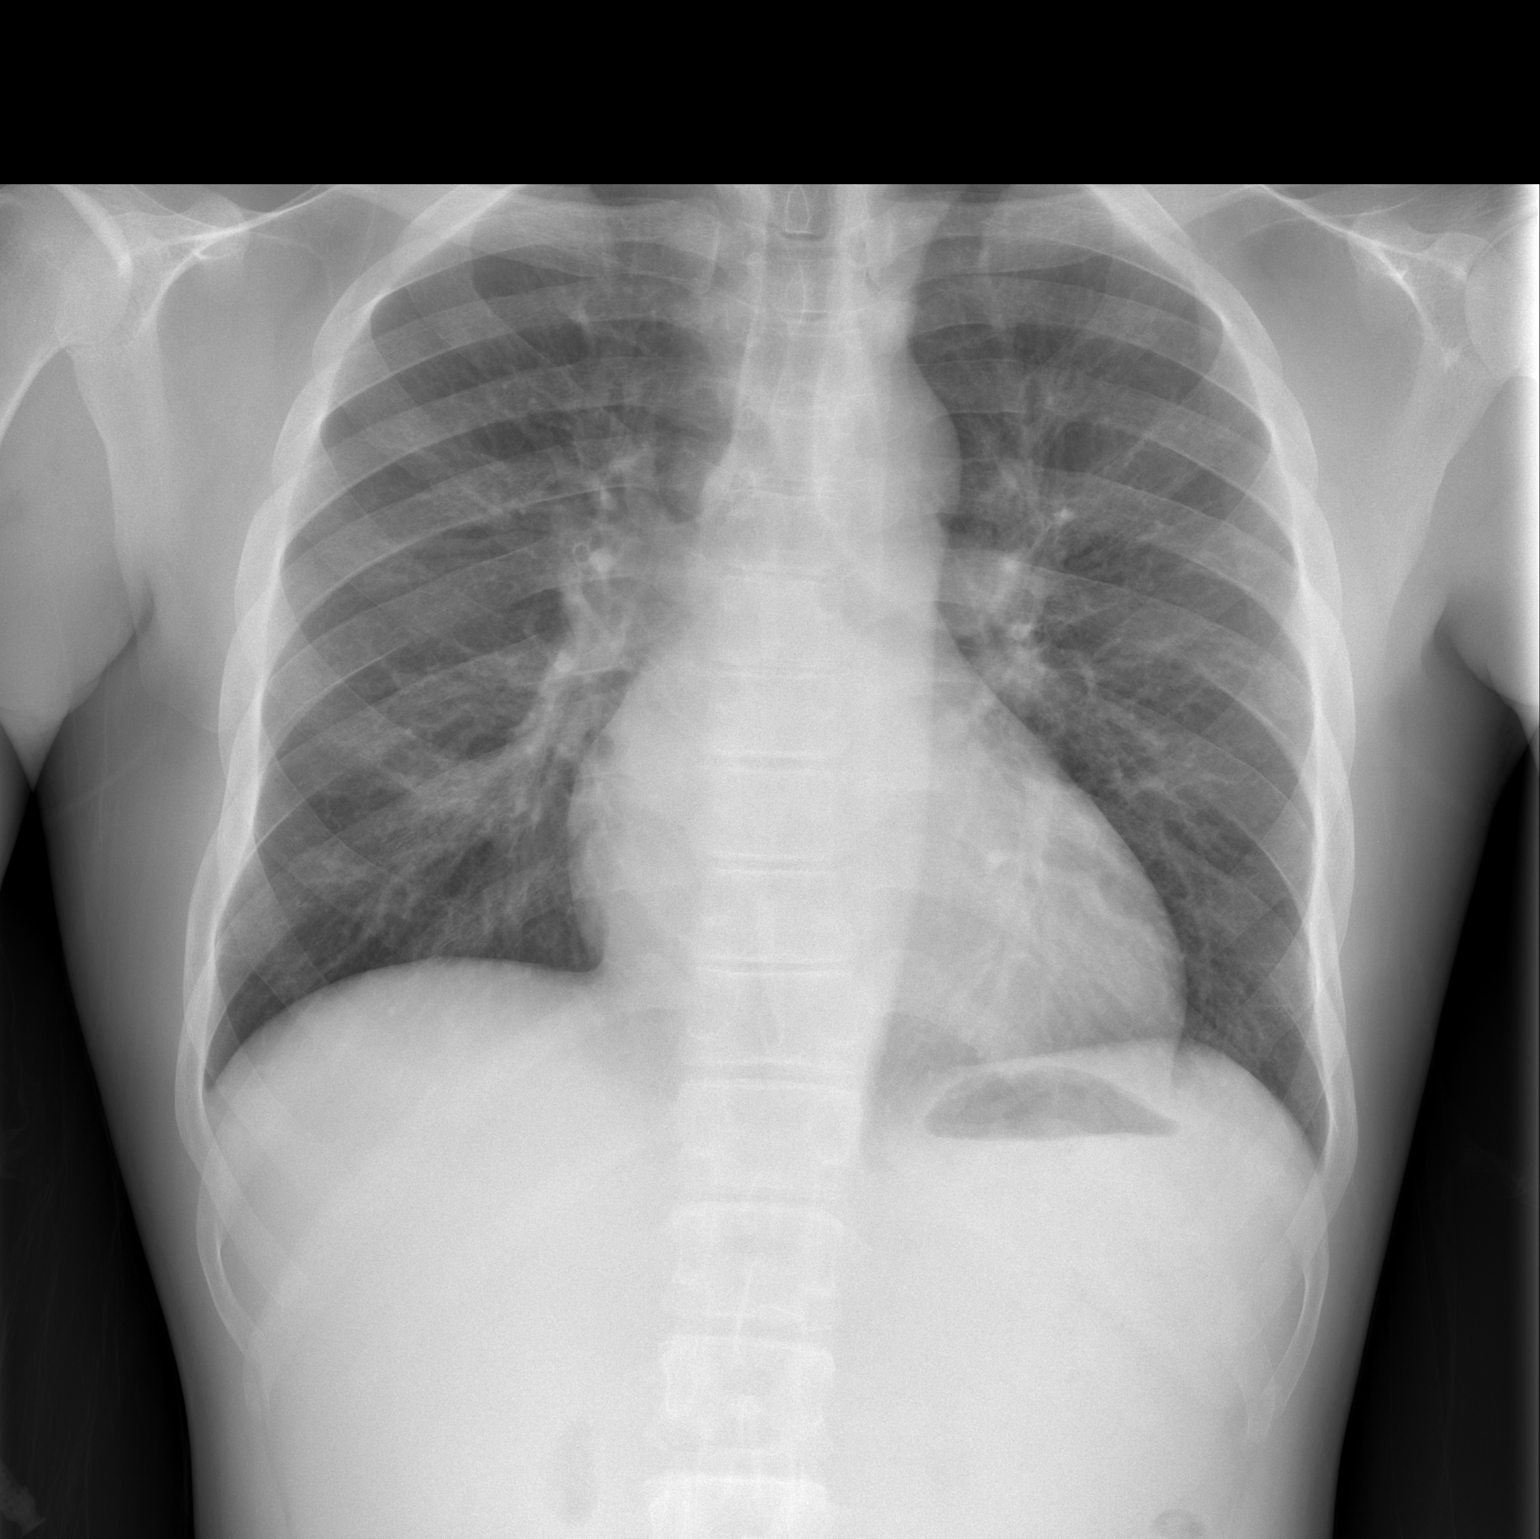

[w chest lat]
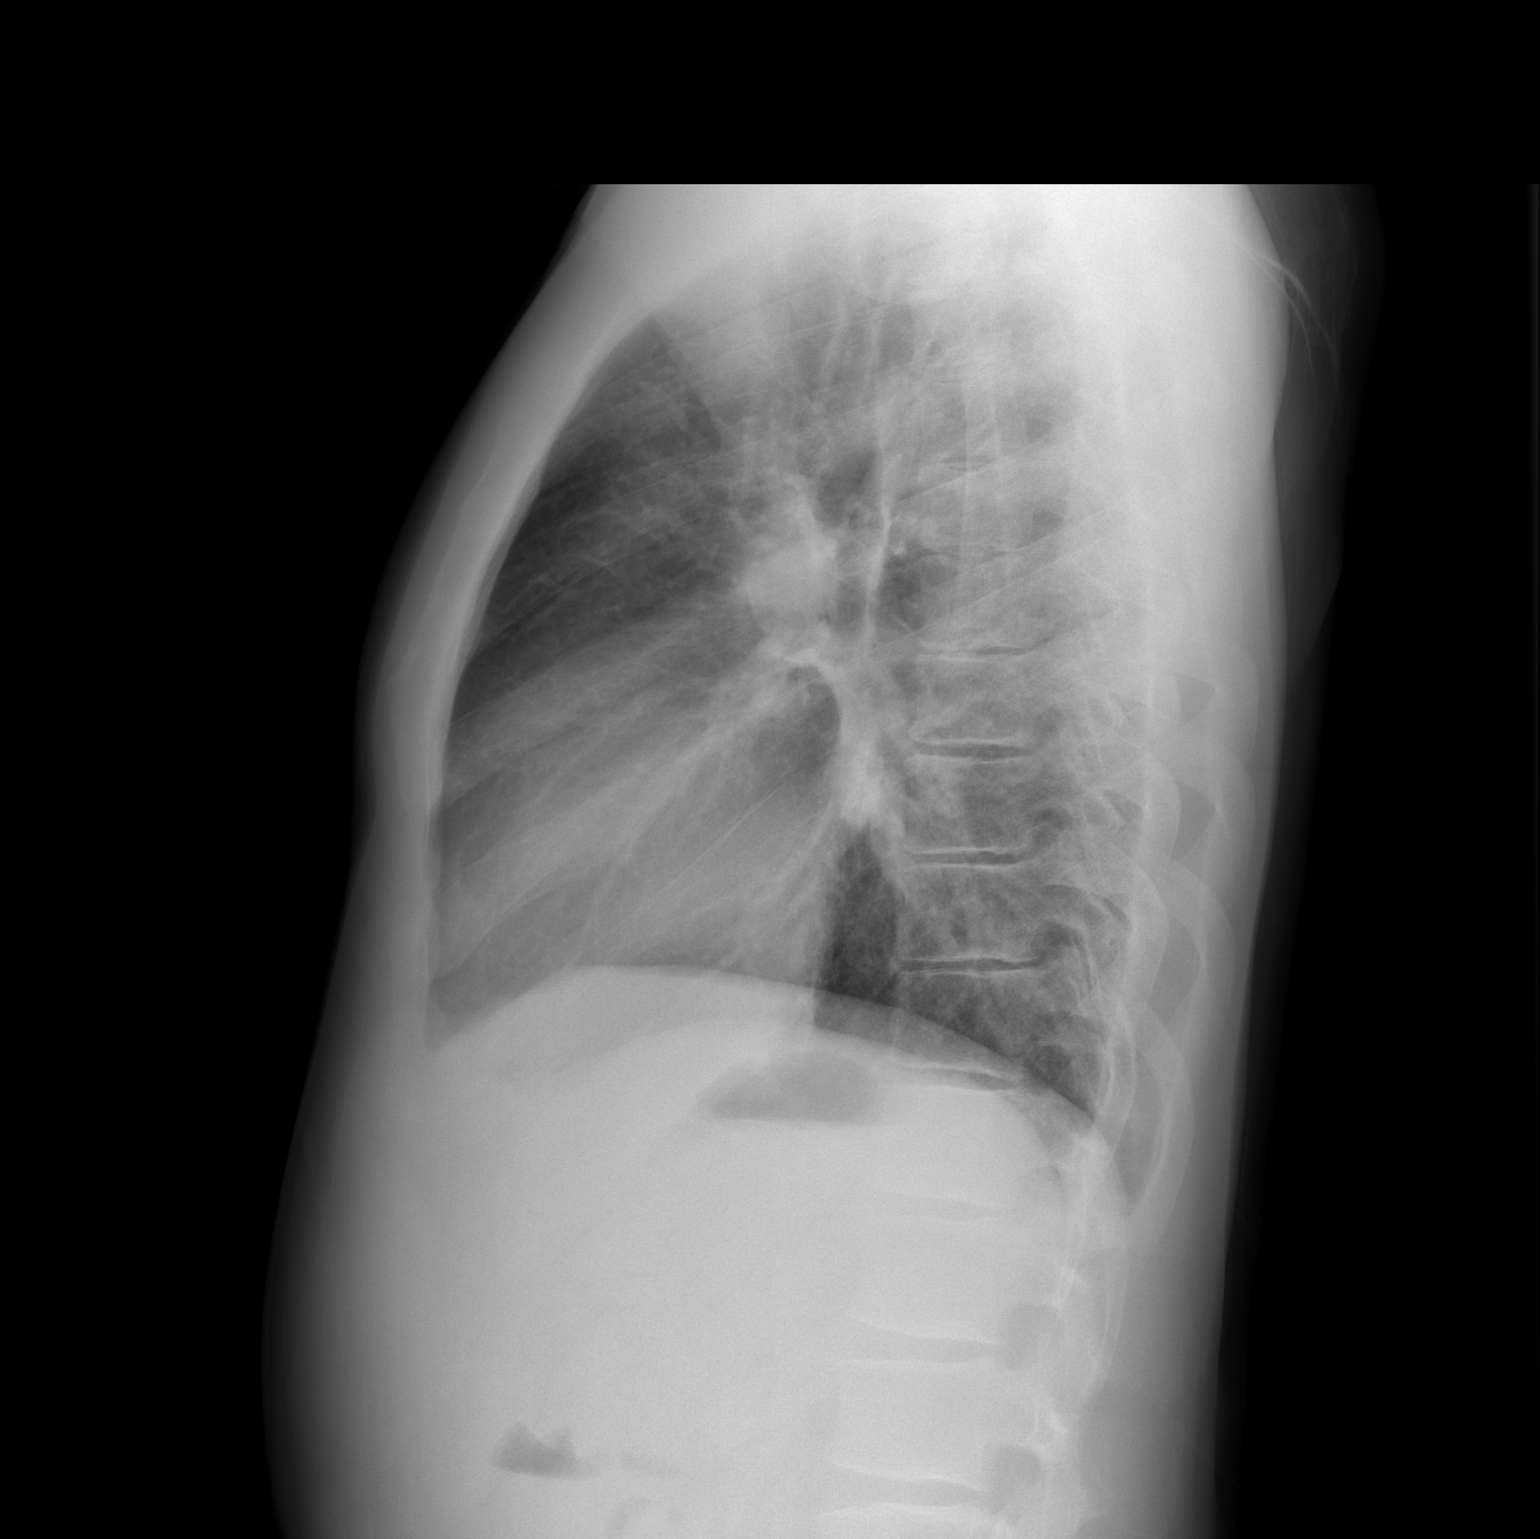

[2 of 2 positions shown; findings below may reference images not displayed]

FINDINGS: The heart size and mediastinal contours are within normal limits. 
Mild peribronchial thickening noted. No focal opacities. The visualized skeletal
structures are unremarkable.
IMPRESSION: Mild bronchitic changes.

## 2009-09-02 ENCOUNTER — Emergency Department (HOSPITAL_COMMUNITY): Admission: EM | Admit: 2009-09-02 | Discharge: 2009-09-02 | Payer: Self-pay | Admitting: Emergency Medicine

## 2009-09-06 IMAGING — CR DG CHEST 2V
2 series · 2 of 2 positions shown · non-contrast
Comparison: 09/18/06
 Heart is mildly enlarged.

CLINICAL DATA: 26 year-old with sickle cell pain.  History of smoking.
 [id number] VIEWS:

[w chest pa]
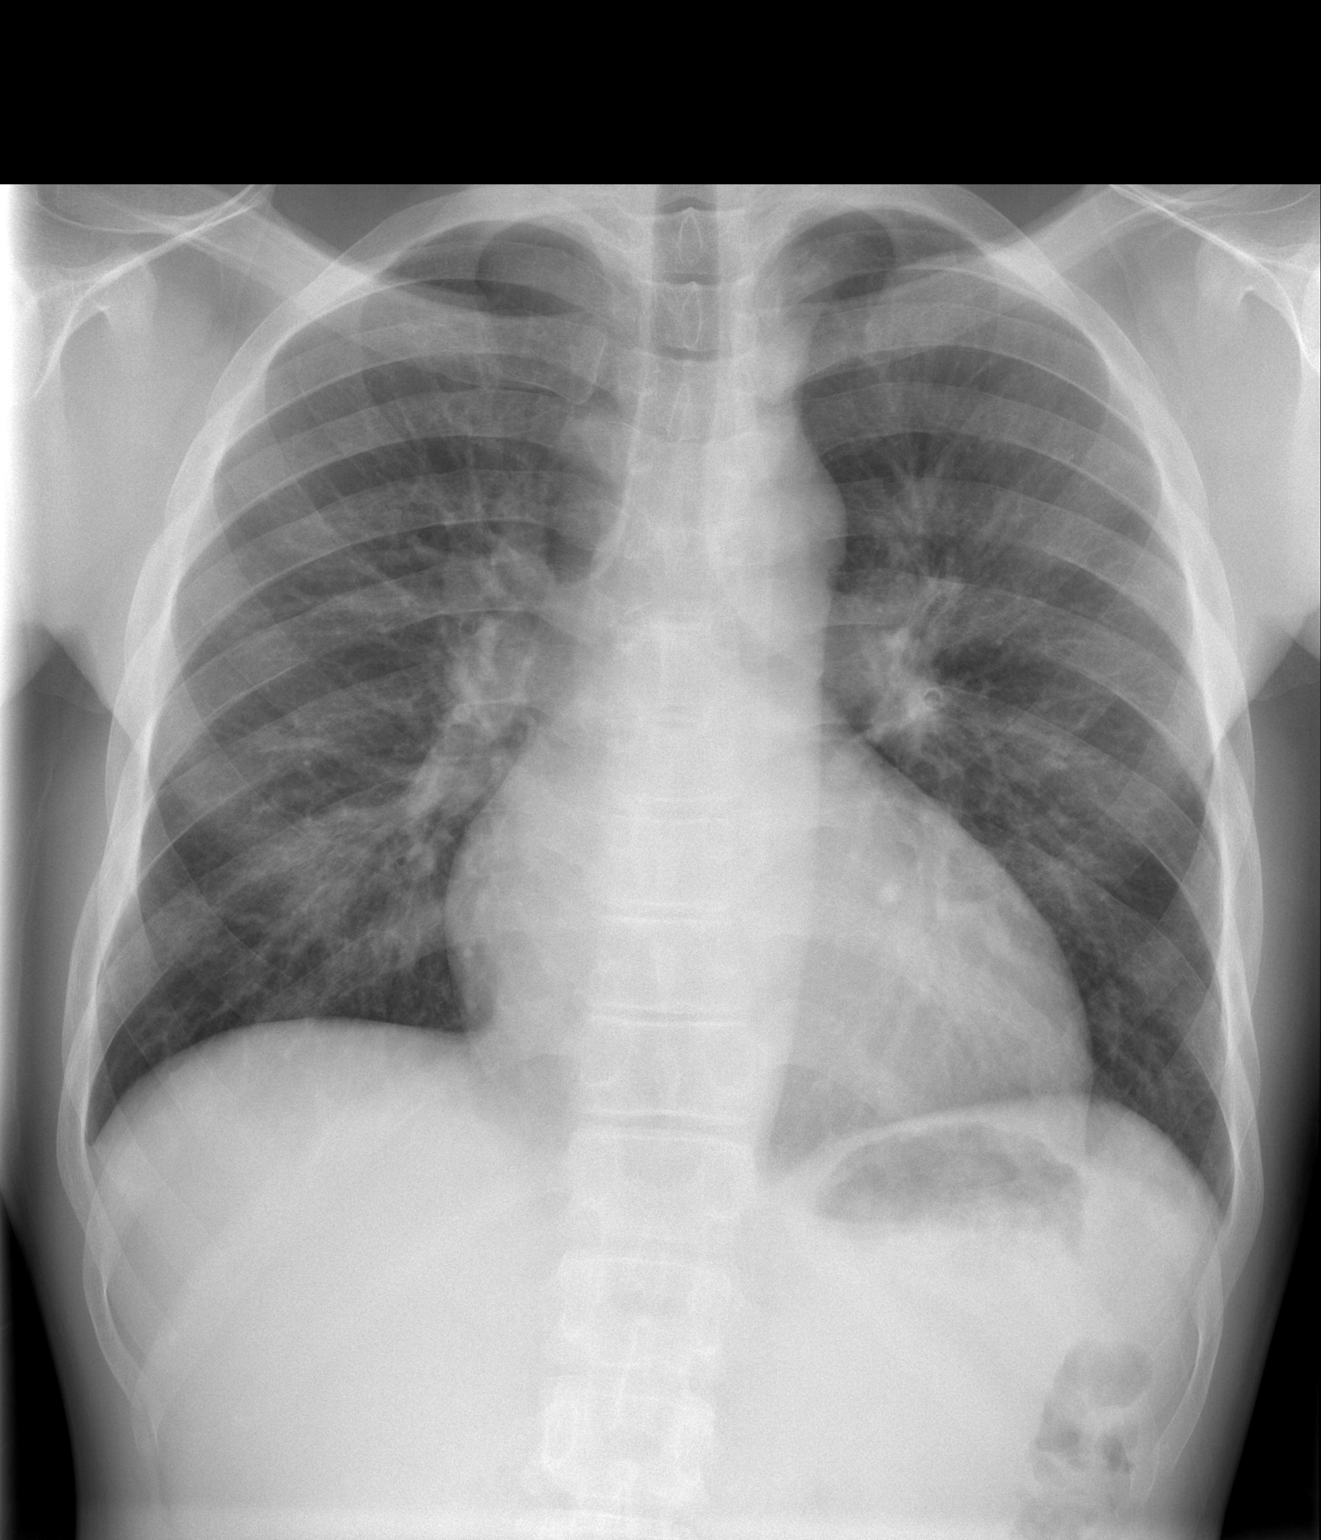

[w chest lat]
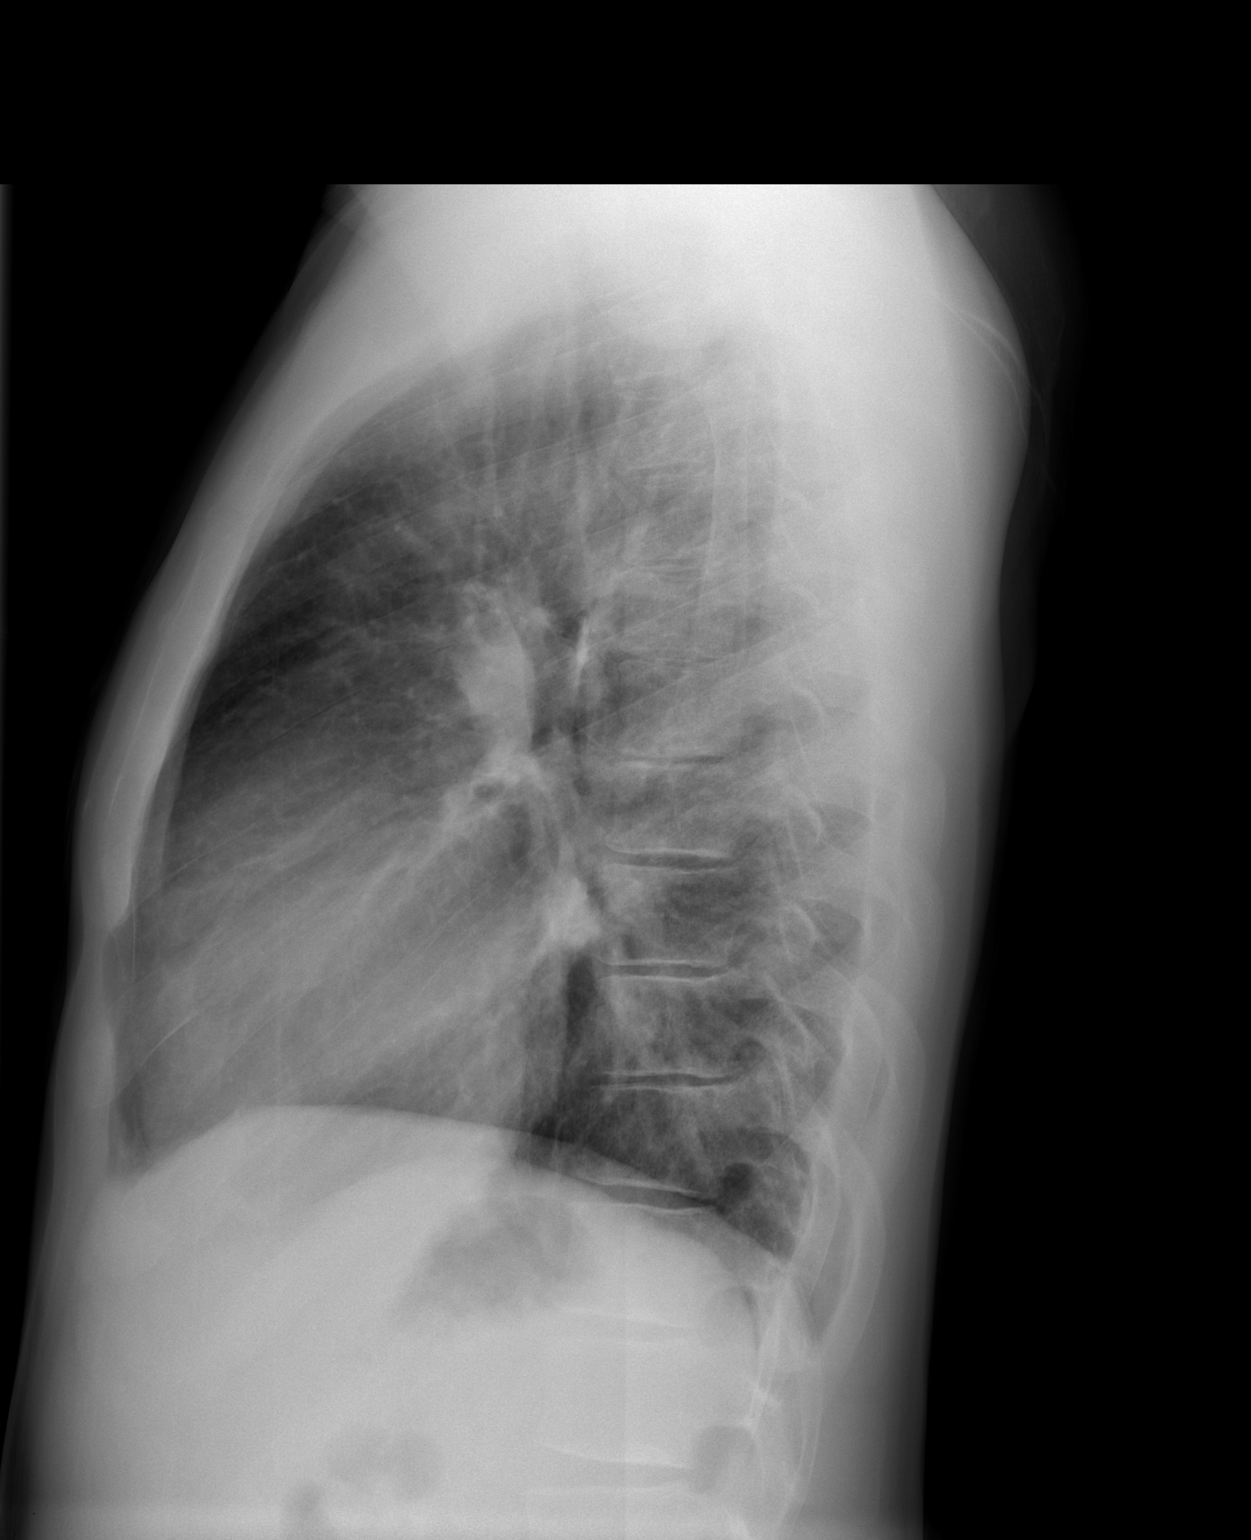

[2 of 2 positions shown; findings below may reference images not displayed]

There is perihilar bronchovascular congestion.  No focal consolidations or pleural effusions are identified.  There are no focal consolidations or pleural effusions.
IMPRESSION: 1.  Cardiomegaly and vascular congestion. 
 2.  Perihilar bronchitic changes.

## 2009-09-09 ENCOUNTER — Observation Stay (HOSPITAL_COMMUNITY): Admission: EM | Admit: 2009-09-09 | Discharge: 2009-09-09 | Payer: Self-pay | Admitting: Emergency Medicine

## 2009-09-21 ENCOUNTER — Emergency Department (HOSPITAL_COMMUNITY): Admission: EM | Admit: 2009-09-21 | Discharge: 2009-09-21 | Payer: Self-pay | Admitting: Emergency Medicine

## 2009-09-23 ENCOUNTER — Inpatient Hospital Stay (HOSPITAL_COMMUNITY): Admission: EM | Admit: 2009-09-23 | Discharge: 2009-10-07 | Payer: Self-pay | Admitting: Emergency Medicine

## 2009-09-24 ENCOUNTER — Encounter (INDEPENDENT_AMBULATORY_CARE_PROVIDER_SITE_OTHER): Payer: Self-pay | Admitting: Internal Medicine

## 2009-09-29 IMAGING — CR DG CHEST 2V
2 series · 2 of 2 positions shown · non-contrast
Comparison: 10/14/06.

CLINICAL DATA: Sickle cell crisis, left chest pain on deep inspiration.
 CHEST - 2 VIEW:

[w chest pa]
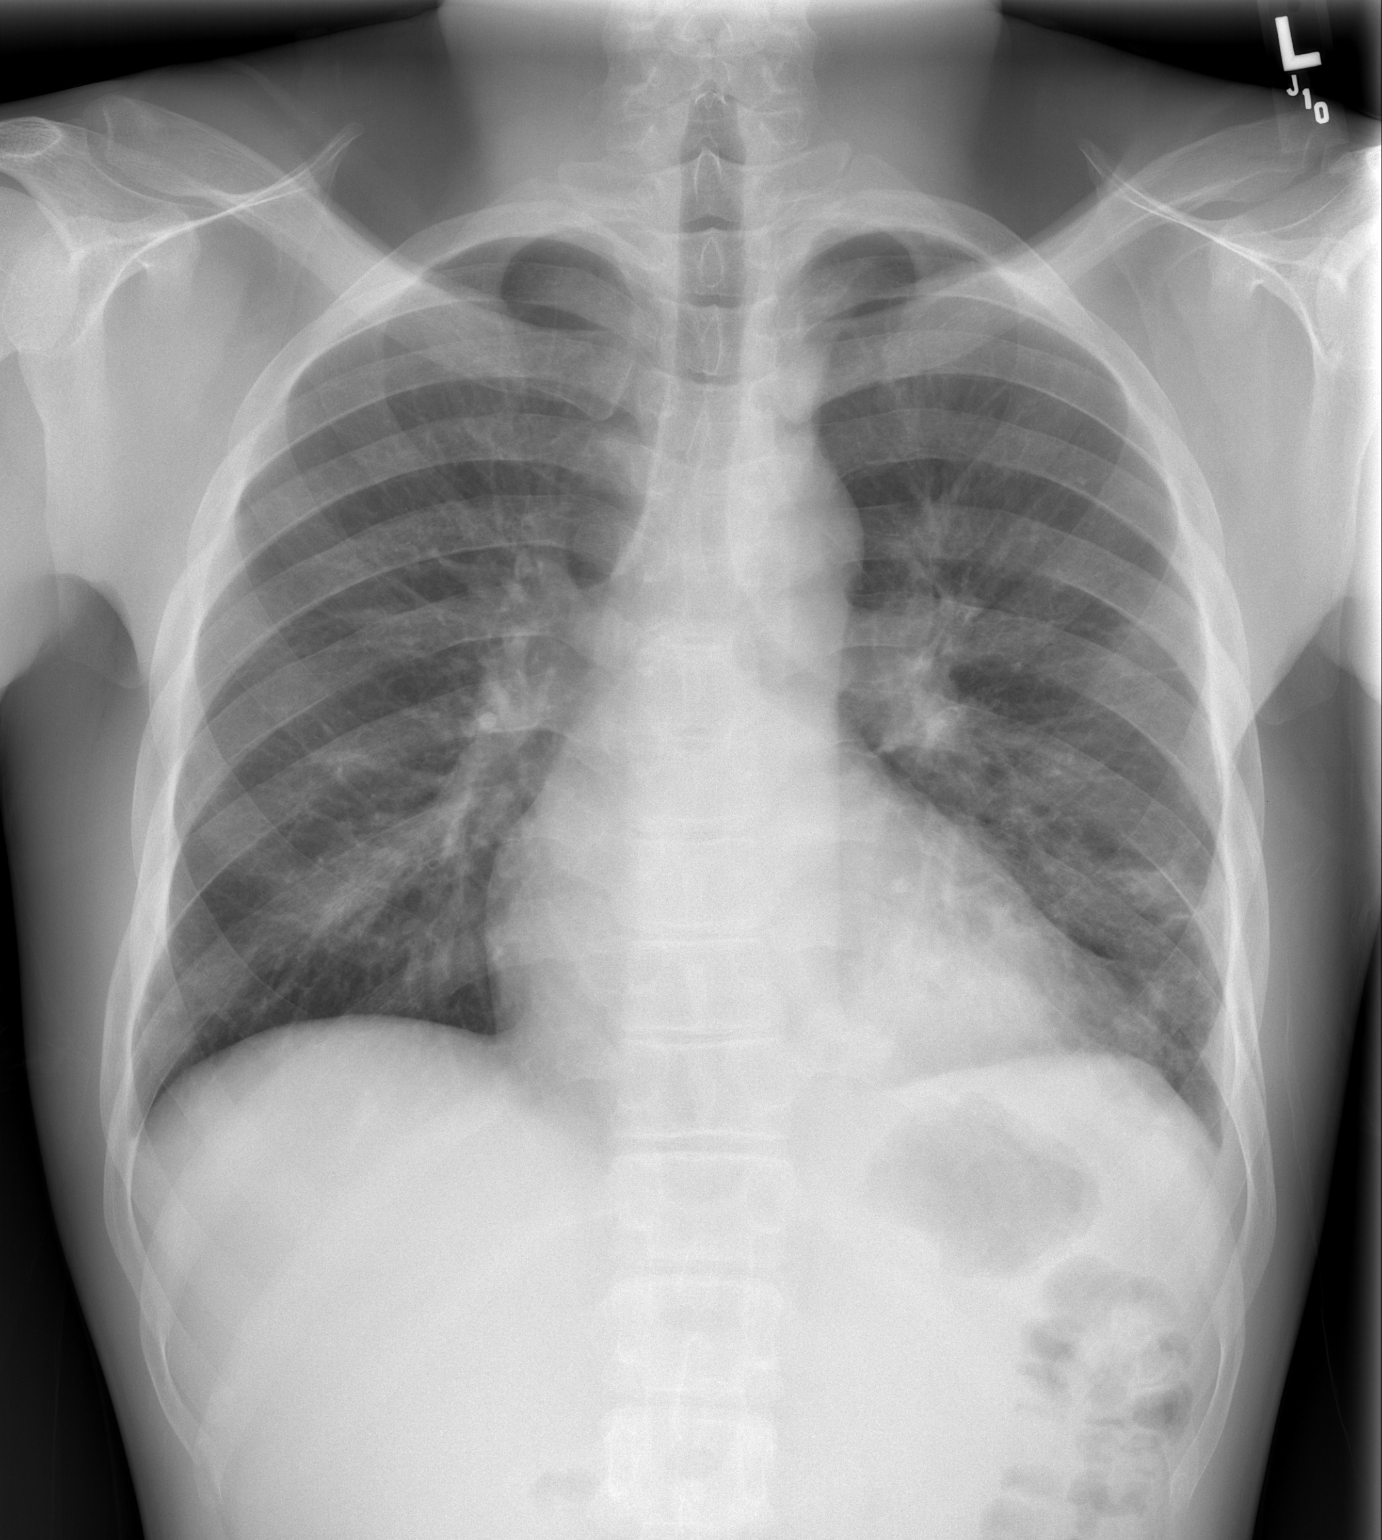

[w chest lat]
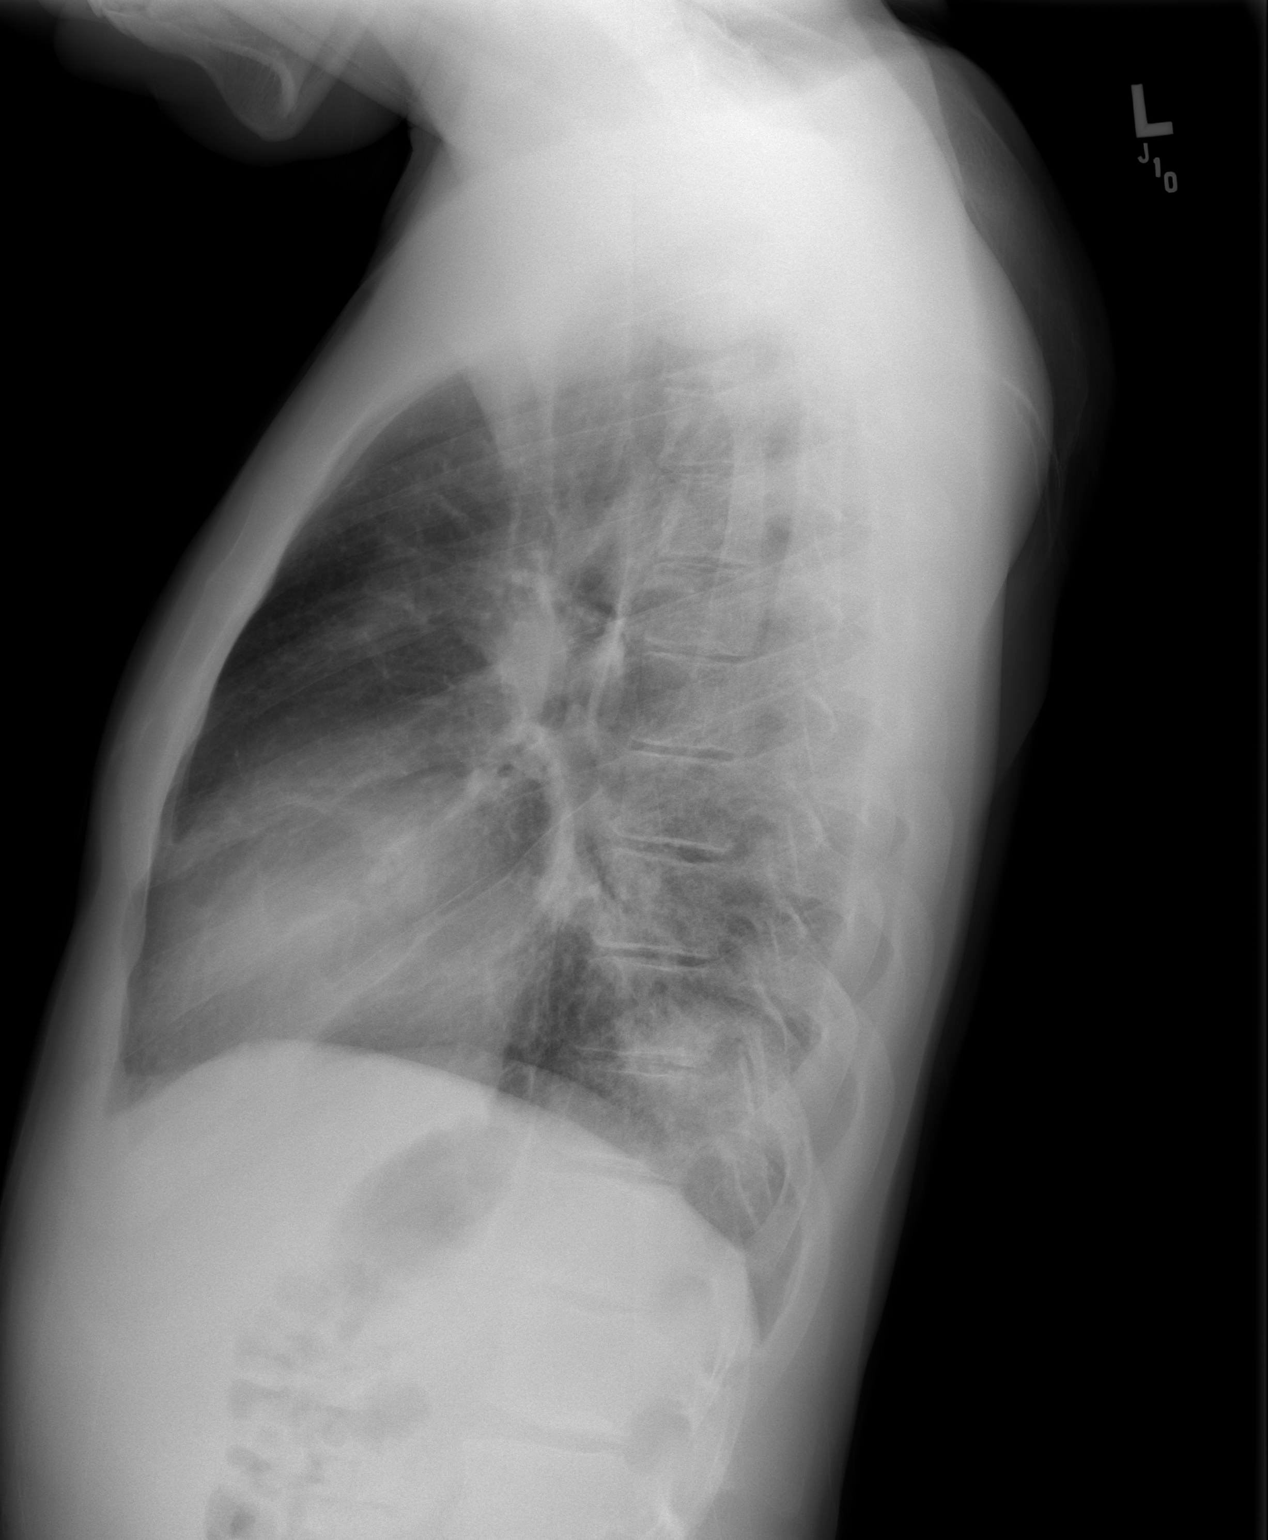

[2 of 2 positions shown; findings below may reference images not displayed]

FINDINGS: Trachea is midline.  Heart size mildly enlarged, stable.  There is new airspace disease in the lingula and left lower lobe.  Right lung clear.
IMPRESSION: Lingular and left lower lobe airspace disease is worrisome for pneumonia.

## 2009-10-08 IMAGING — CR DG CHEST 2V
2 series · 2 of 2 positions shown · non-contrast
Comparison: 11/06/06.

CLINICAL DATA: 26 year-old-male with sickle cell disease, chest pain, shortness of breath. 
 CHEST - 2 VIEW:

[w chest pa]
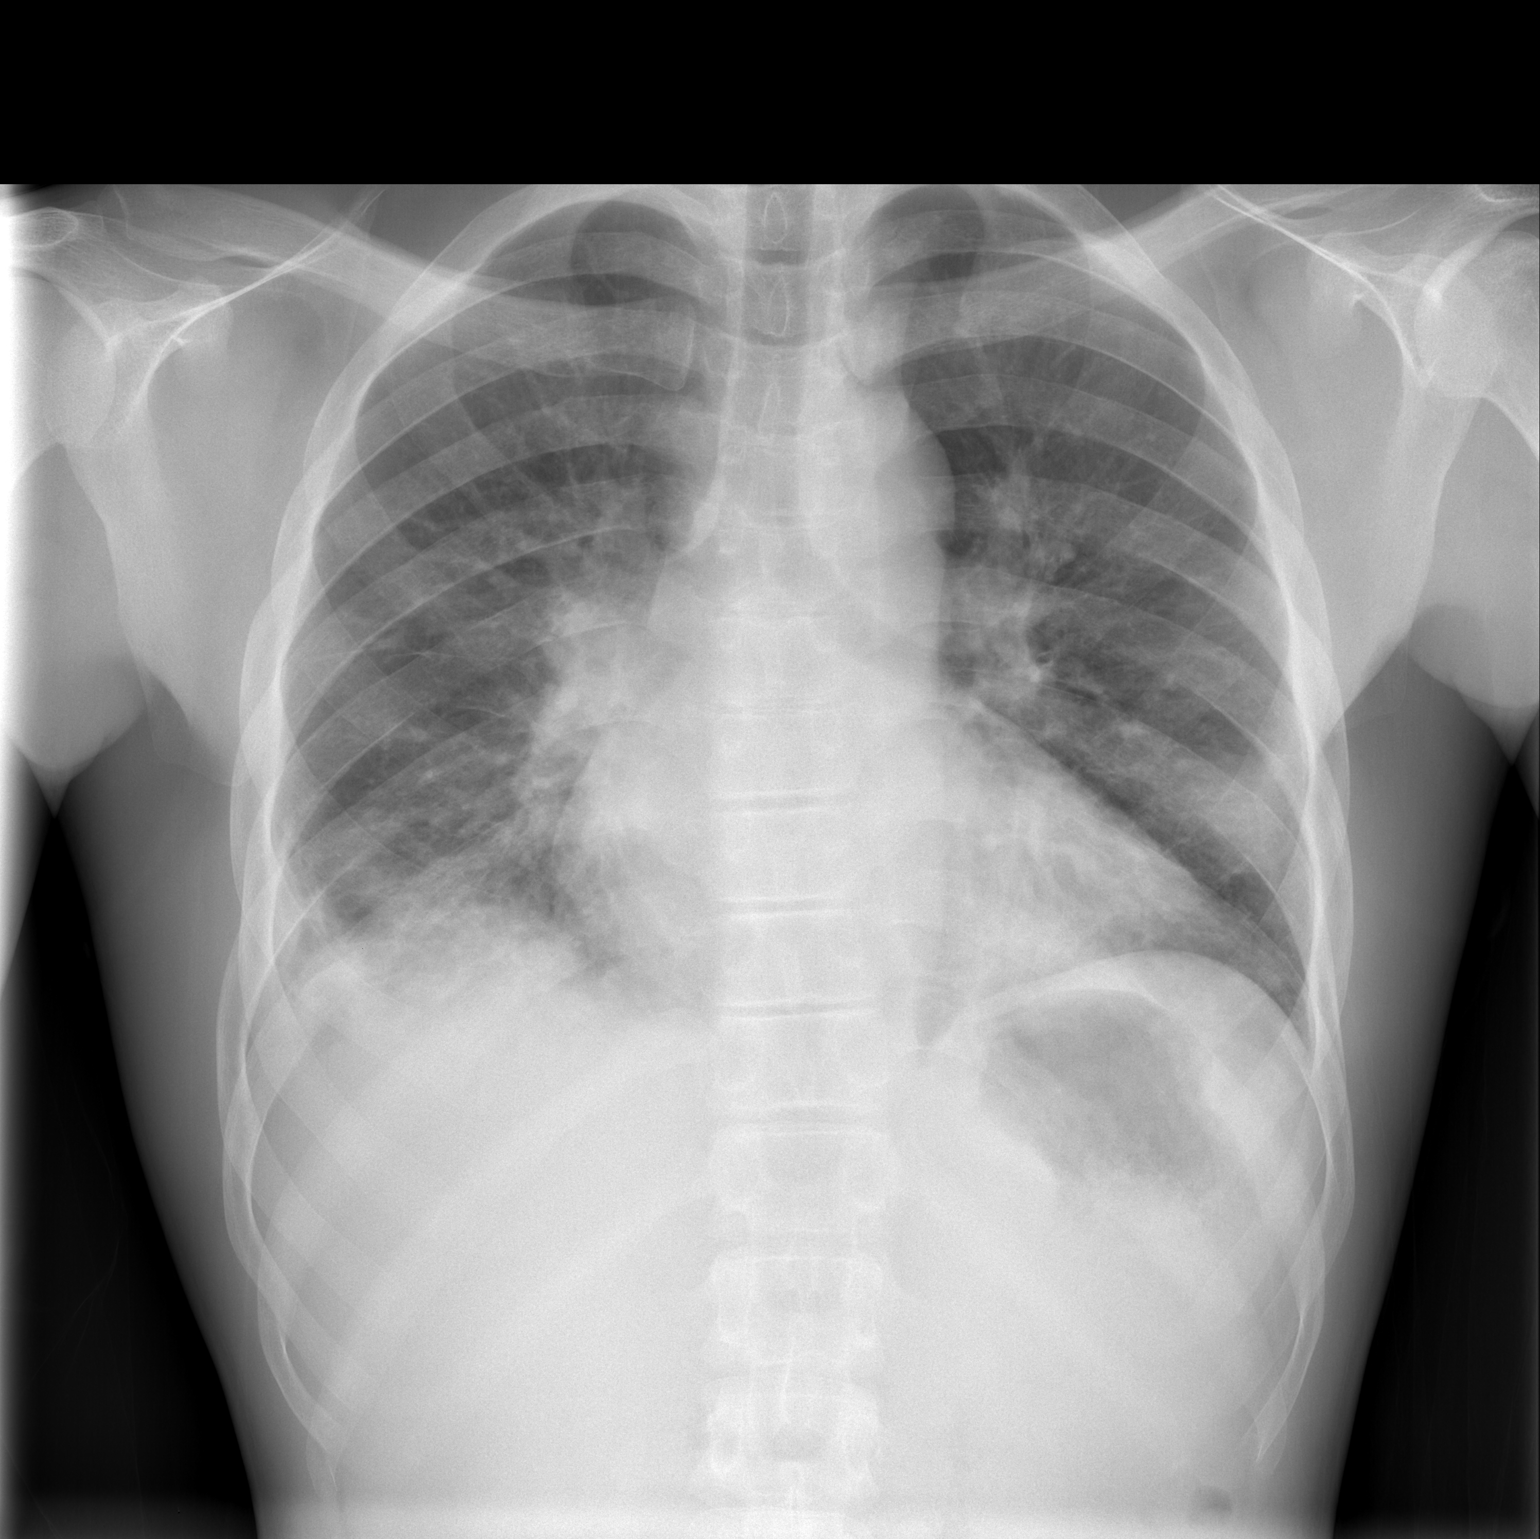

[w chest lat]
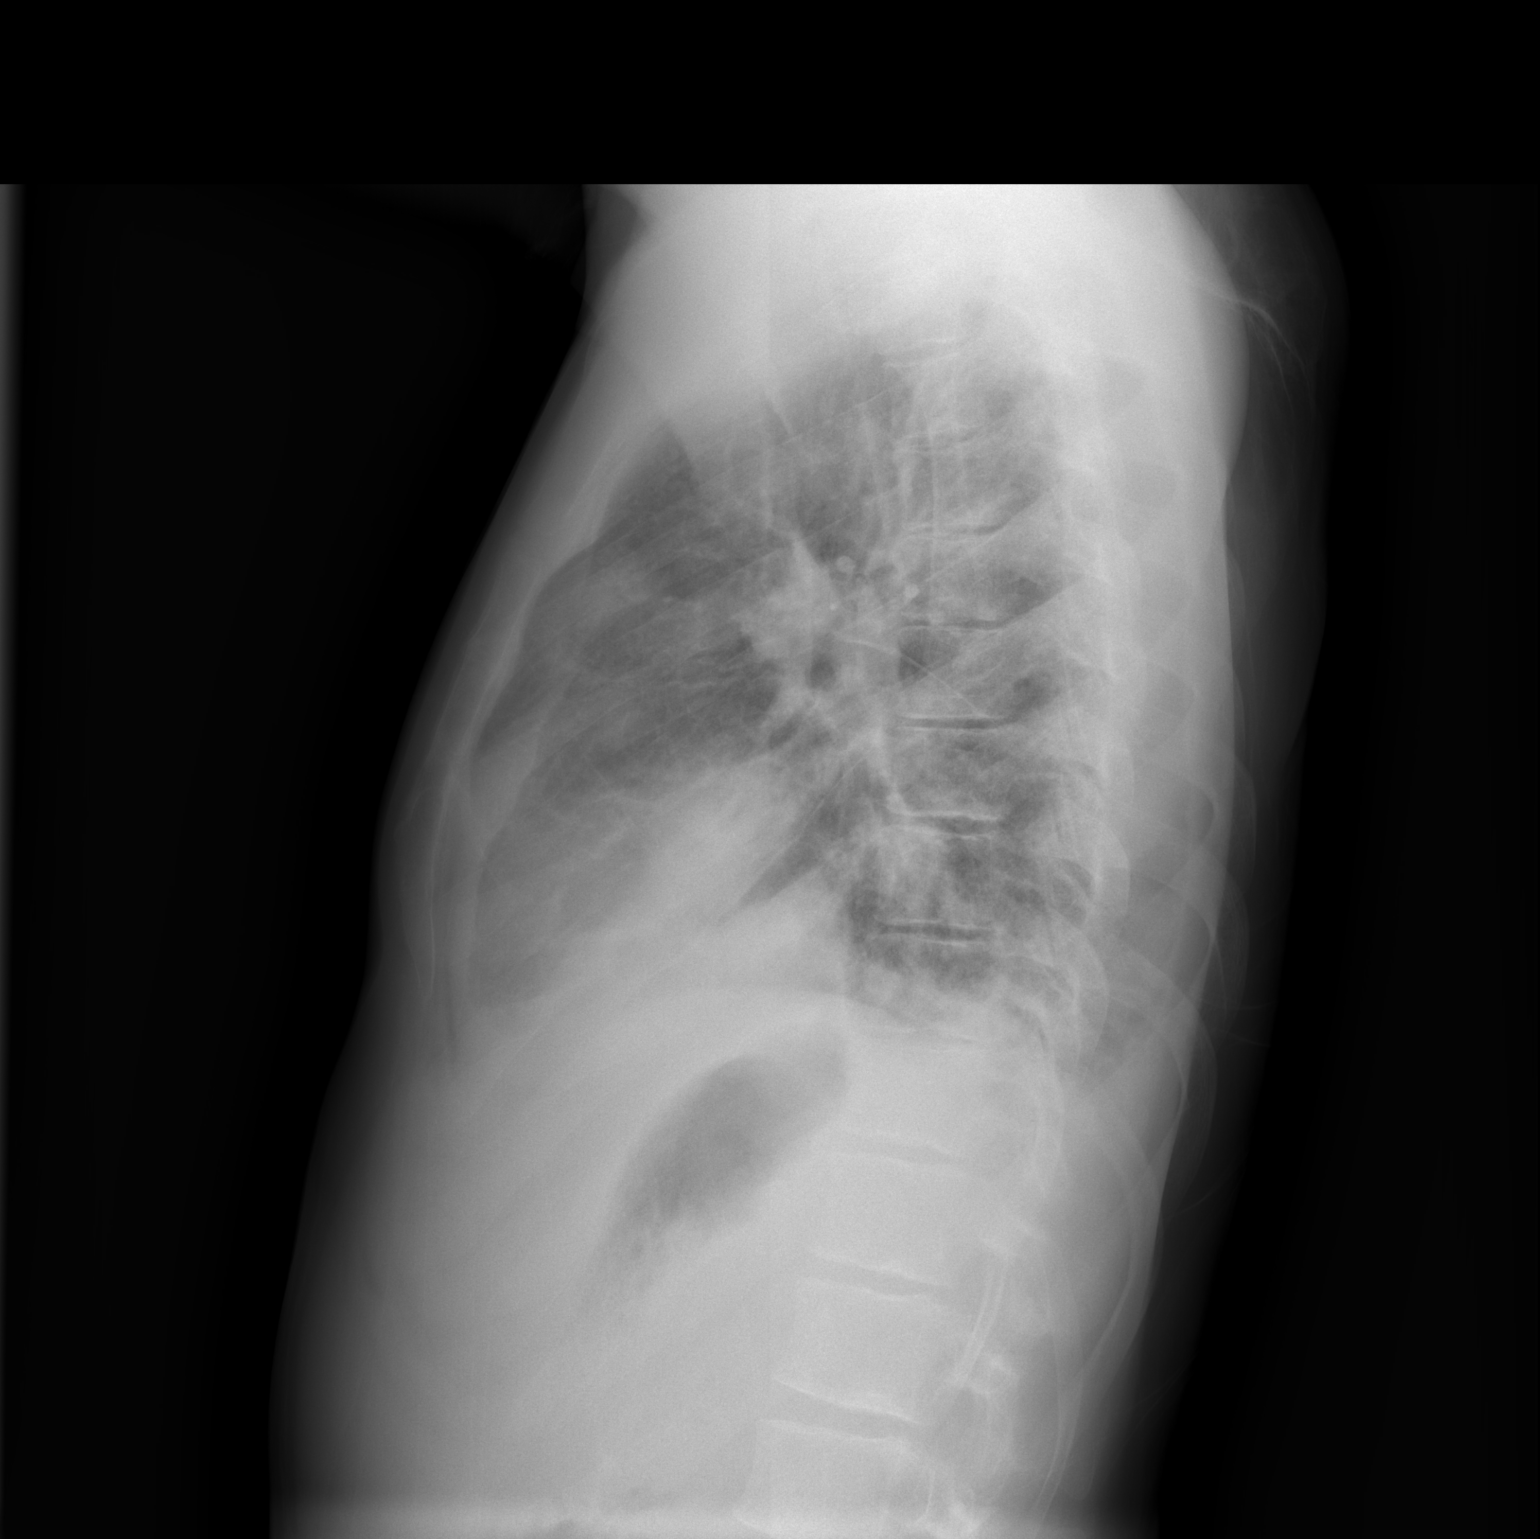

[2 of 2 positions shown; findings below may reference images not displayed]

FINDINGS: The heart is mildly enlarged but stable. Mediastinal and hilar contours are within normal limits and unchanged. There are patchy bilateral infiltrates.  There is also an associated right pleural effusion.
IMPRESSION: Bilateral infiltrates and right-sided pleural effusion.

## 2009-10-08 IMAGING — CT CT ANGIO CHEST
2 of 8 series · 10 of 36 positions shown · IV contrast (APPLIED)
Comparison: none

CLINICAL DATA: Sickle cell.  Shortness of breath. 
CT ANGIOGRAPHY OF CHEST:
TECHNIQUE: Multidetector CT imaging of the chest was performed during bolus injection of intravenous contrast.  Multiplanar CT angiographic image reconstructions were generated to evaluate the vascular anatomy.
Contrast:  100 cc Omnipaque 300

[Series 7: pe 1.0 b40f thins for pacs · axial · 0.66mm/px · z∈[-224,-8]mm · 9 of 270 slices shown]
[im 27/270  lung]
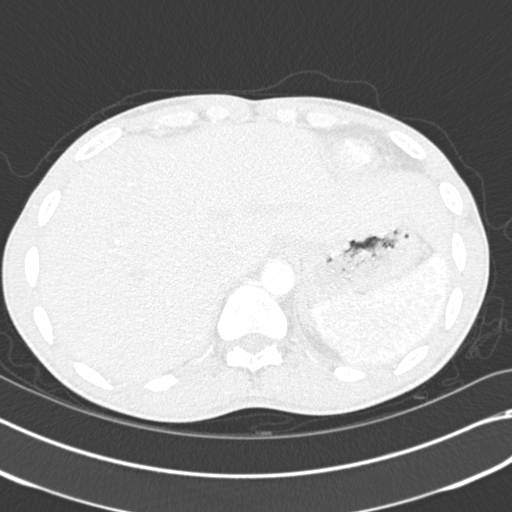
[im 54/270  mediastinal]
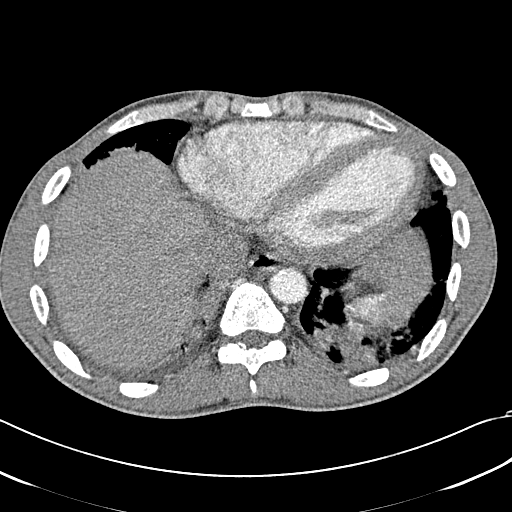
[im 81/270  lung]
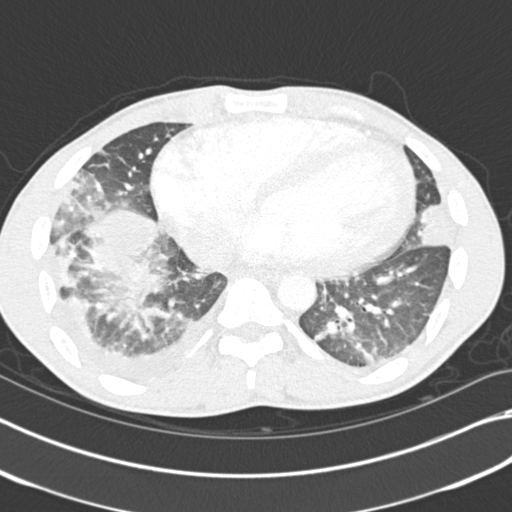
[im 108/270  mediastinal]
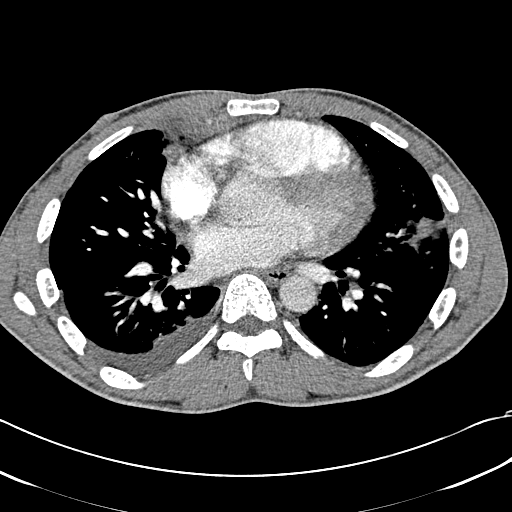
[im 135/270  lung]
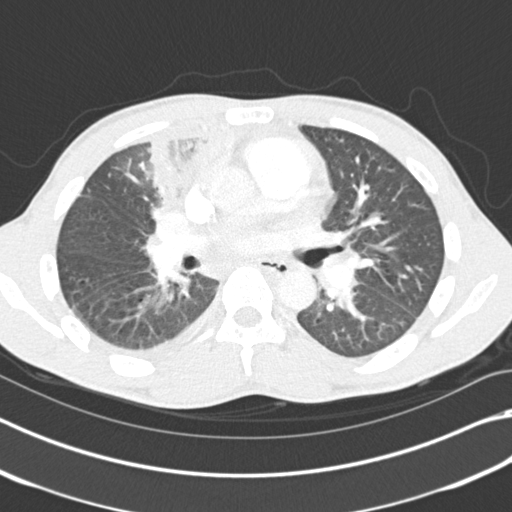
[im 162/270  mediastinal]
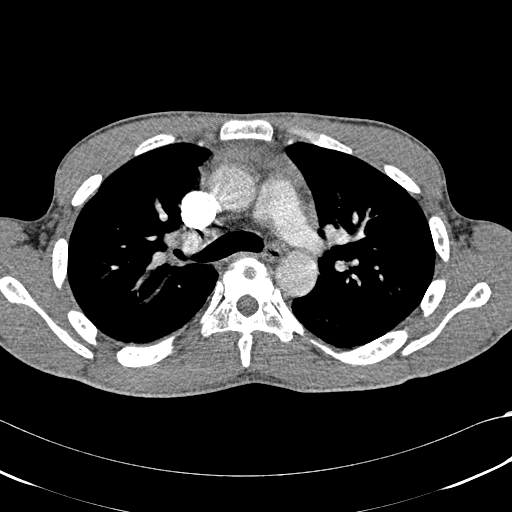
[im 189/270  lung]
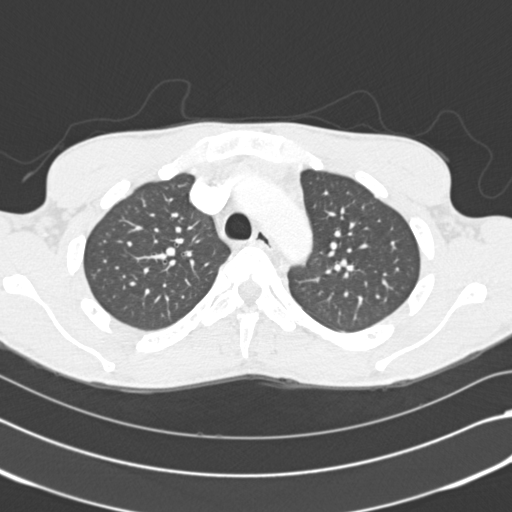
[im 216/270  mediastinal]
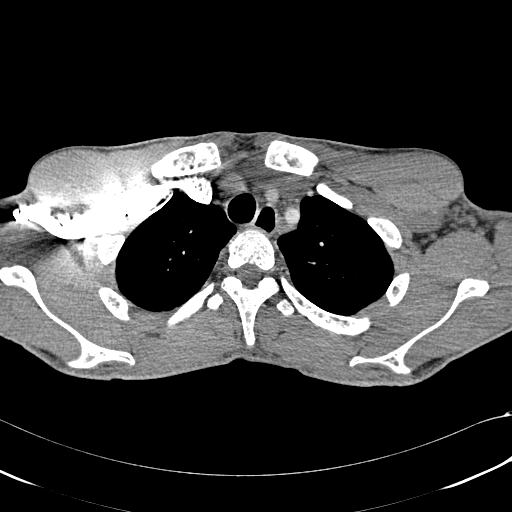
[im 243/270  lung]
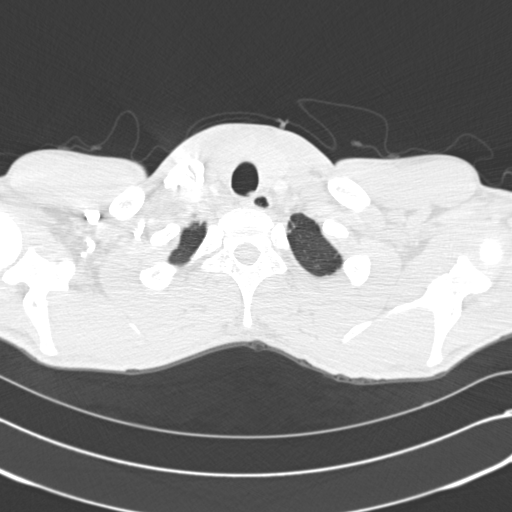

[Series 602: <mpr range> · coronal · 0.66mm/px · 1 of 101 slices shown]
[im 51/101  mediastinal]
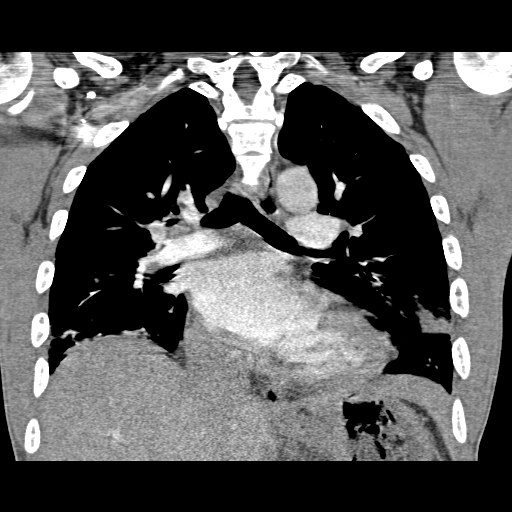

[10 of 36 positions shown; findings below may reference images not displayed]

FINDINGS: The study is suboptimal however diagnostic.  Pulmonary vasculature opacifies normally without filling defects to suggest embolus.  Bilateral pulmonary infiltrates identified involving the medial segment of the right middle lobe, lateral segment of the lingula, and posterior basilar segments bilaterally.  Enlarged right hilar lymph nodes 2 cm short axis.
IMPRESSION: 1.  Negative for pulmonary embolus. 
2.  Bilateral pulmonary infiltrates.

## 2009-10-19 ENCOUNTER — Ambulatory Visit (HOSPITAL_COMMUNITY): Admission: RE | Admit: 2009-10-19 | Discharge: 2009-10-19 | Payer: Self-pay | Admitting: Otolaryngology

## 2009-10-28 ENCOUNTER — Inpatient Hospital Stay (HOSPITAL_COMMUNITY): Admission: EM | Admit: 2009-10-28 | Discharge: 2009-11-06 | Payer: Self-pay | Admitting: Emergency Medicine

## 2009-11-03 IMAGING — CR DG CHEST 2V
2 series · 2 of 2 positions shown · non-contrast
Comparison: 11/15/06.

CLINICAL DATA: Sickle cell crisis.  Pain.  
 CHEST ? 2 VIEW:

[w chest pa]
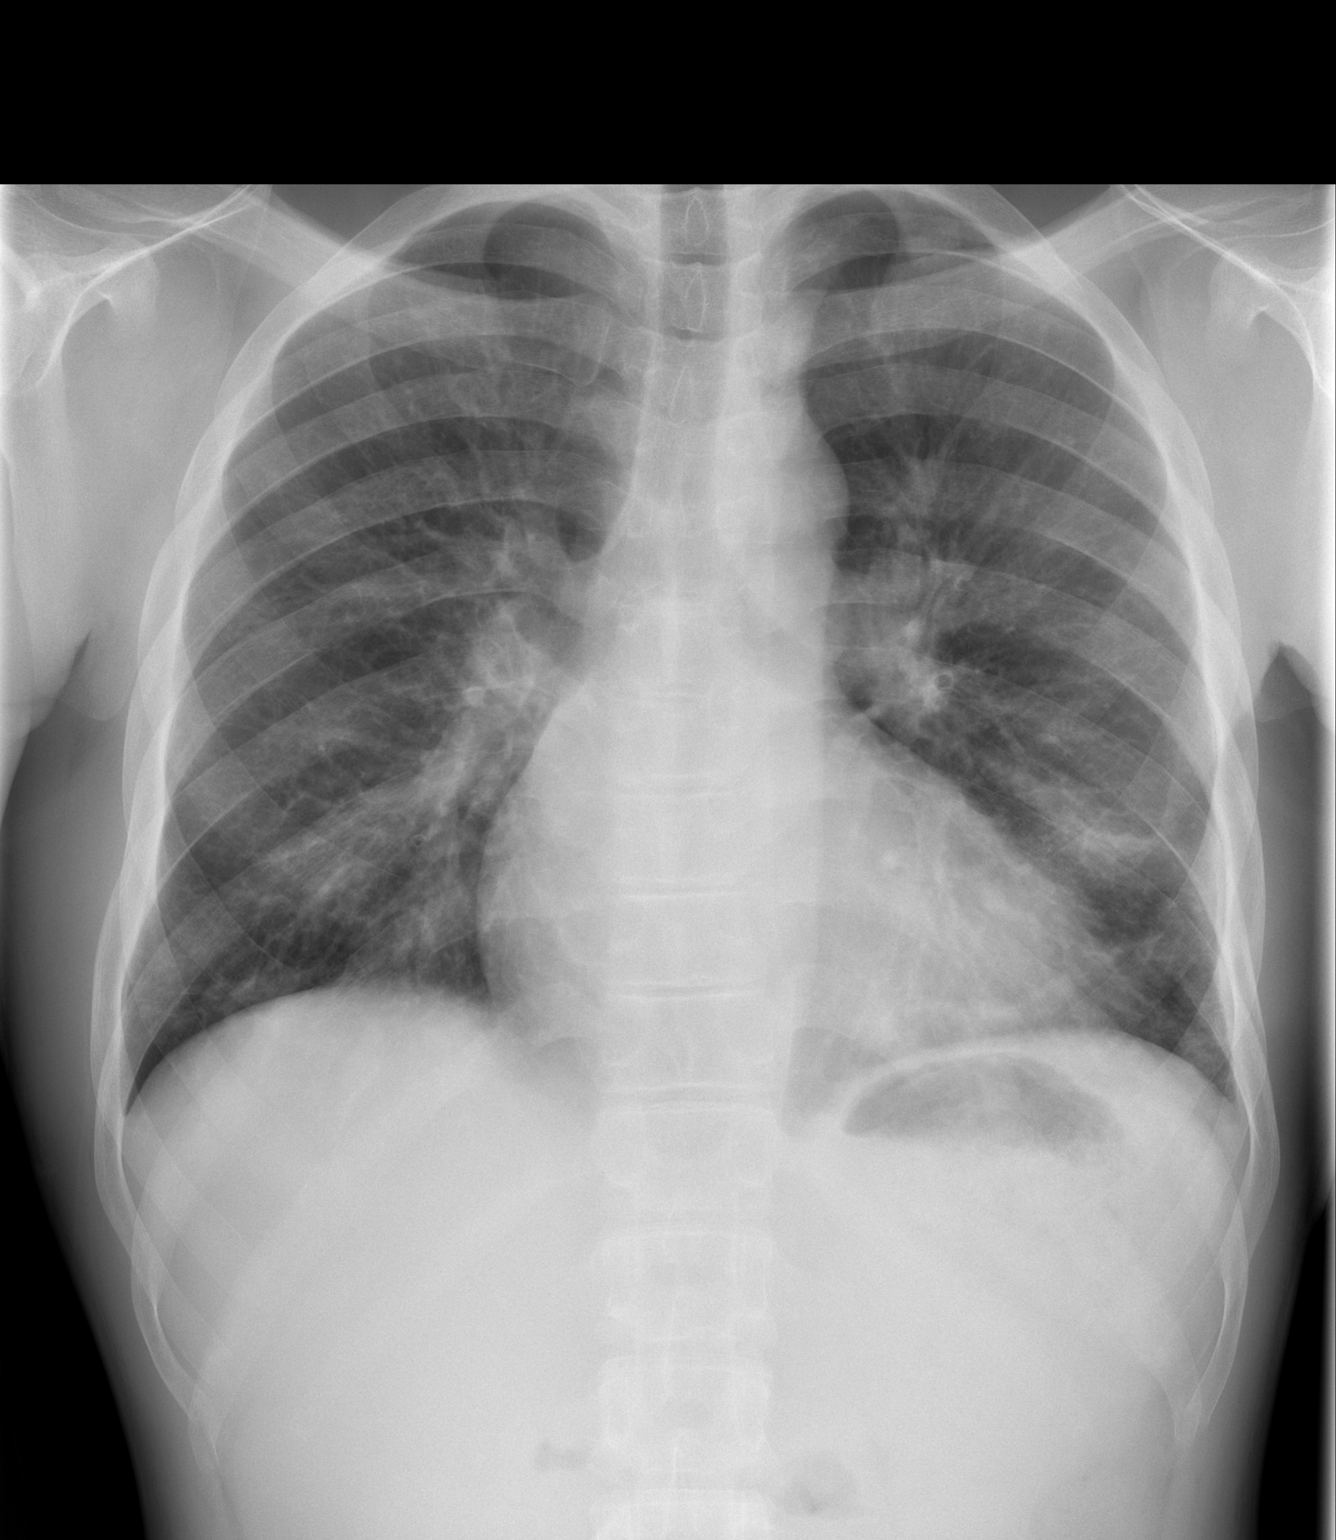

[w chest lat]
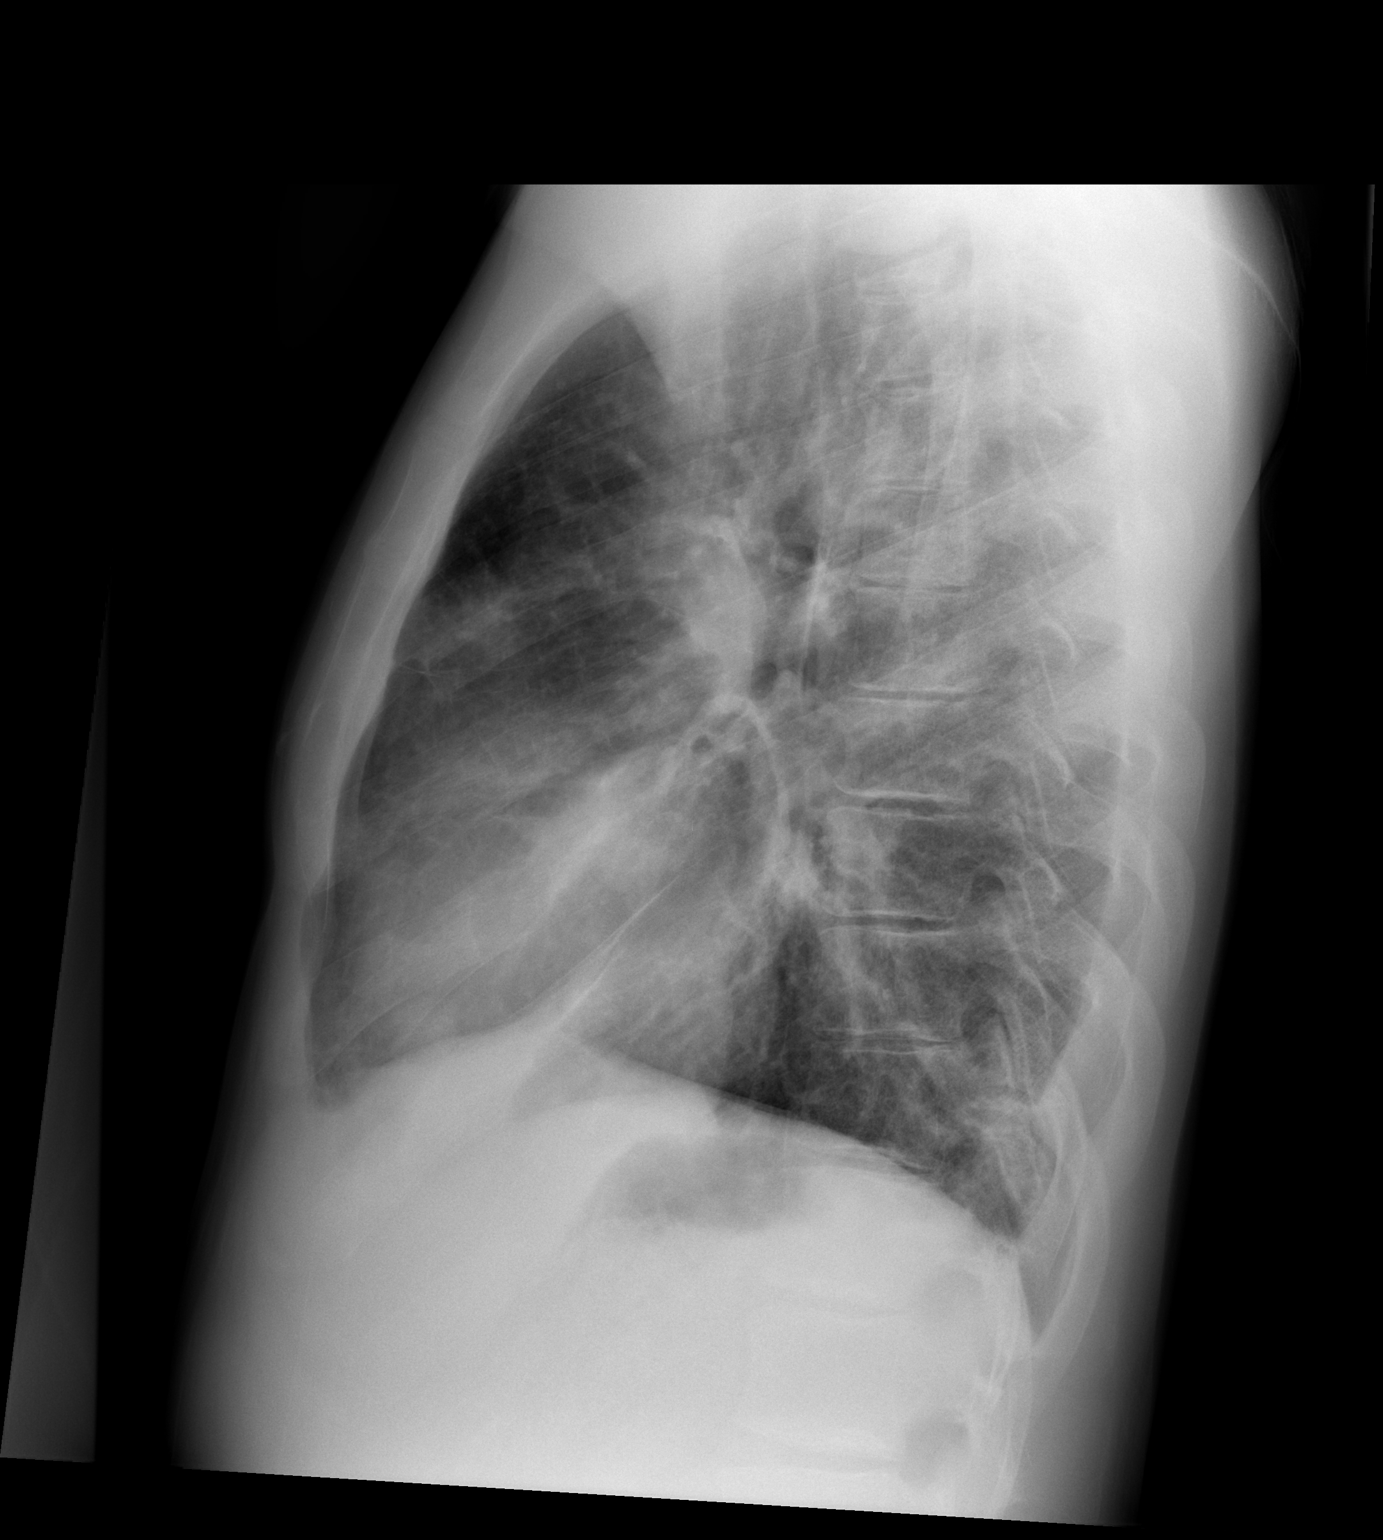

[2 of 2 positions shown; findings below may reference images not displayed]

FINDINGS: Clearing of previously noted infiltrates.  Some focal density lingular segment is most compatible with atelectasis.  Maybe a small focal density in the anterior segment of the right upper lobe also compatible with atelectasis.  Accentuated peribronchial markings.  Mild cardiomegaly.  Mild vascular congestion.  No CHF.
IMPRESSION: Mild focal atelectasis right upper lobe and lingula segment.  Mild cardiomegaly.

## 2009-11-07 IMAGING — CT CT ANGIO CHEST
1 of 4 series · 18 of 36 positions shown · IV contrast (APPLIED)
Comparison: 12/11/06 and 11/15/06.

CLINICAL DATA: 26 year-old male with sickle cell crisis, anemia, hypokalemia, chest pain. Dyspnea. Cough.
CT ANGIOGRAPHY OF CHEST:
TECHNIQUE: Multidetector CT imaging of the chest was performed during bolus injection of intravenous contrast.  Multiplanar CT angiographic image reconstructions were generated to evaluate the vascular anatomy.
Contrast:  80 cc Omnipaque 300.

[Series 6: pe 1.0 b40f thins for pacs · axial · 0.70mm/px · z∈[-188,+32]mm · 18 of 247 slices shown]
[im 13/247  lung]
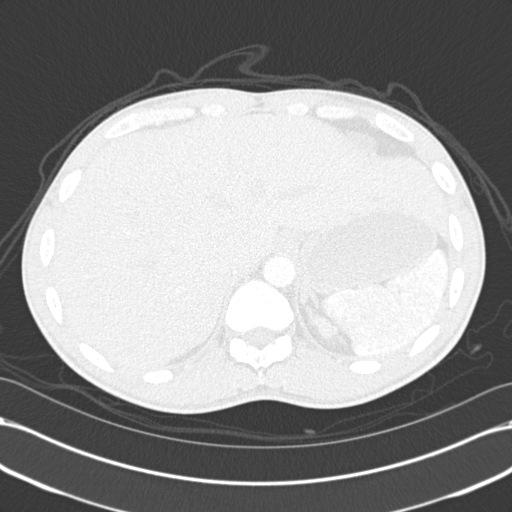
[im 25/247  mediastinal]
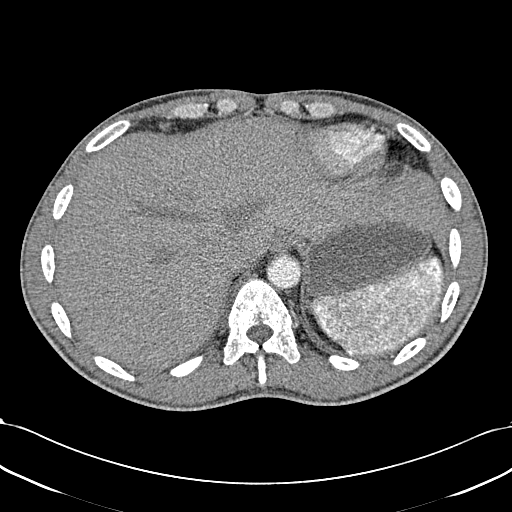
[im 37/247  lung]
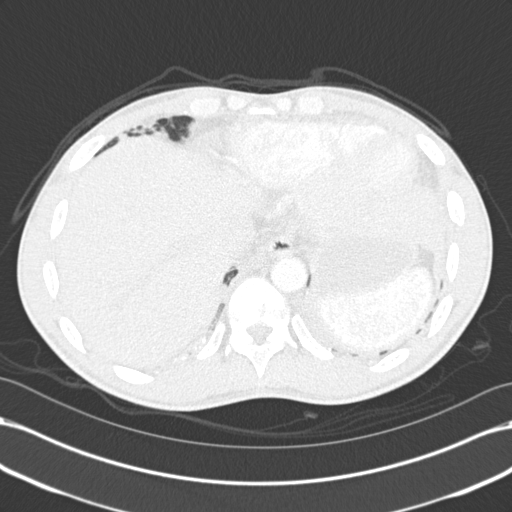
[im 50/247  mediastinal]
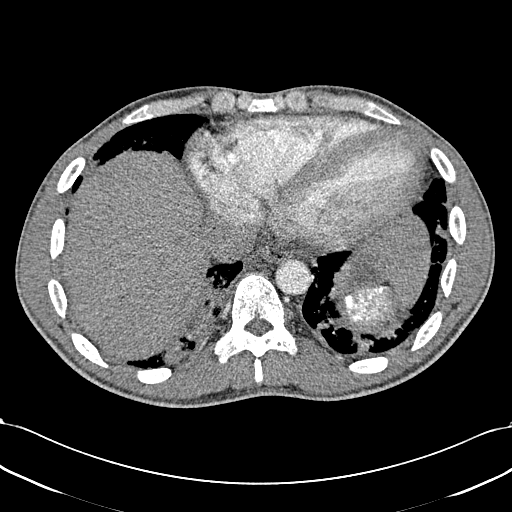
[im 62/247  lung]
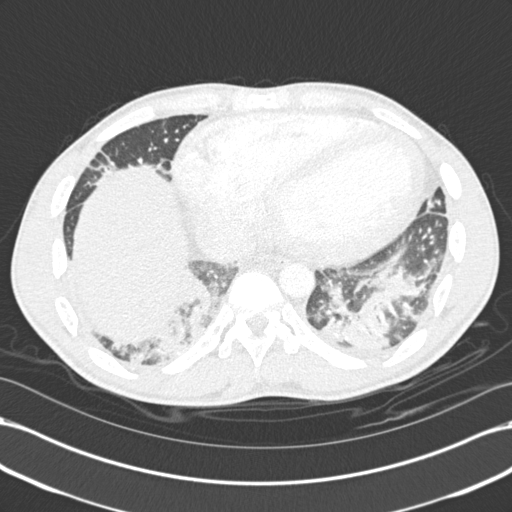
[im 74/247  mediastinal]
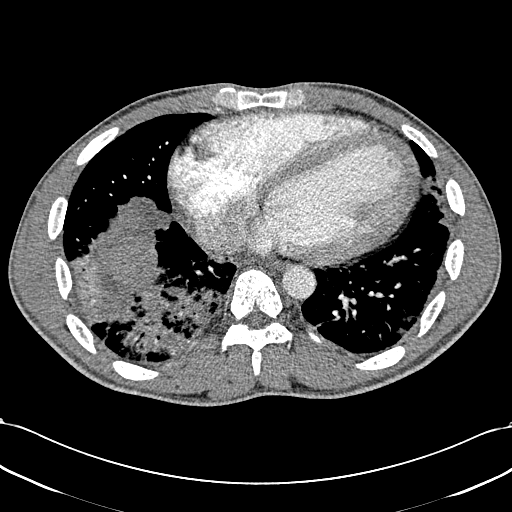
[im 87/247  lung]
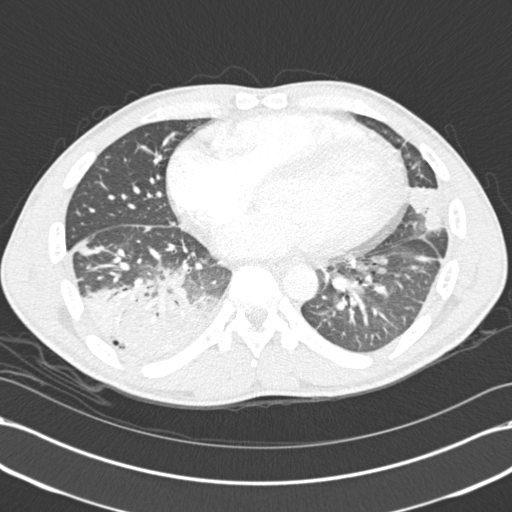
[im 99/247  mediastinal]
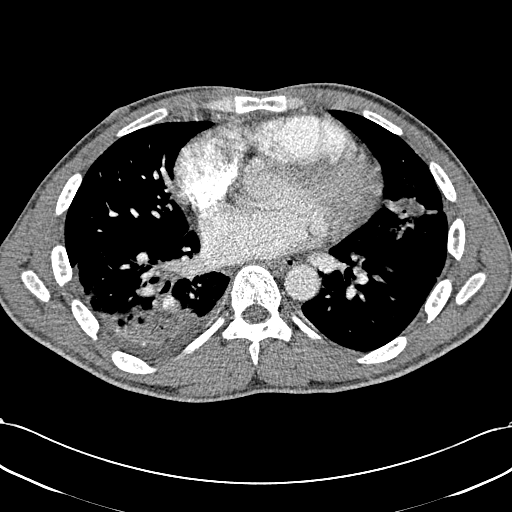
[im 111/247  lung]
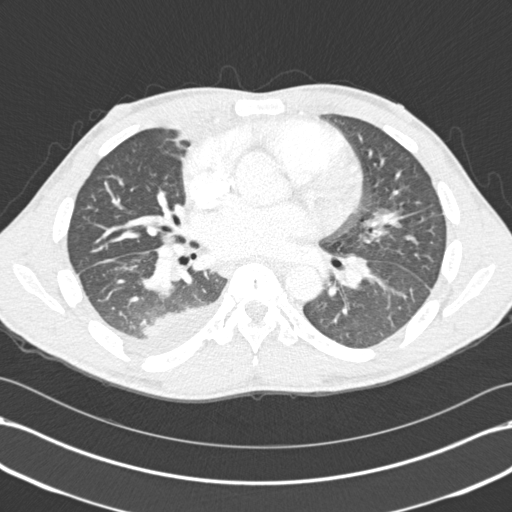
[im 136/247  mediastinal]
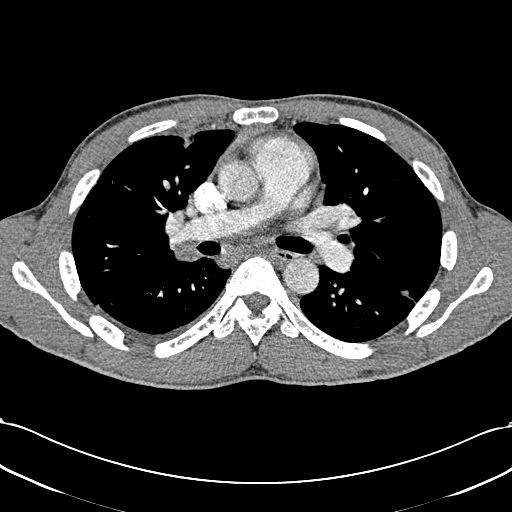
[im 148/247  lung]
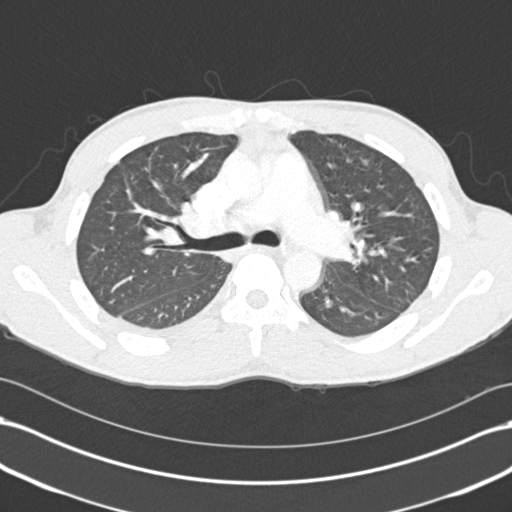
[im 160/247  mediastinal]
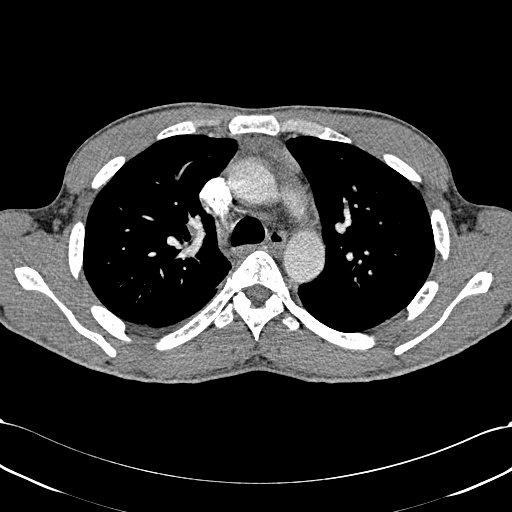
[im 173/247  lung]
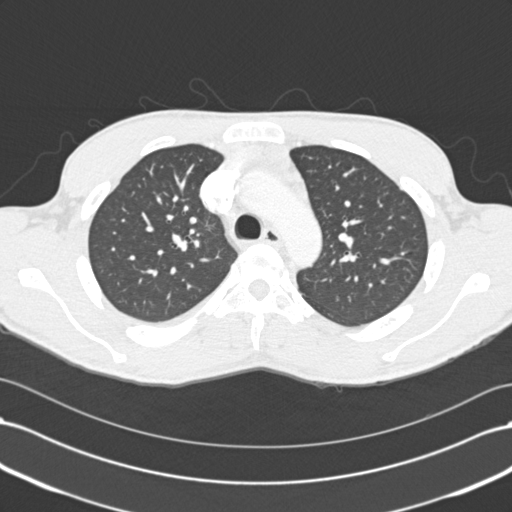
[im 185/247  mediastinal]
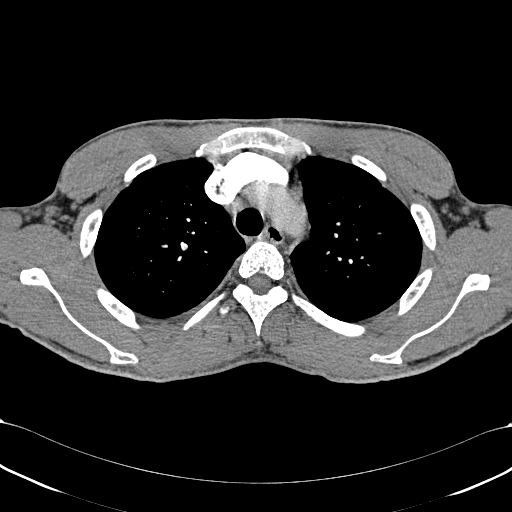
[im 197/247  lung]
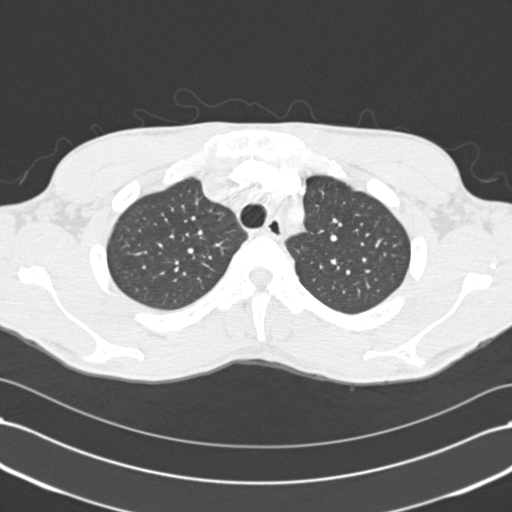
[im 210/247  mediastinal]
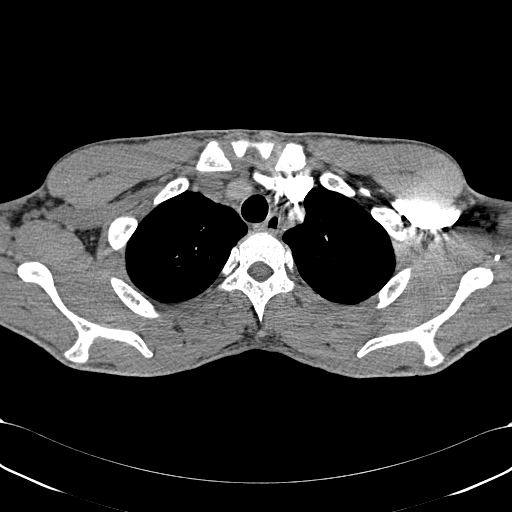
[im 222/247  lung]
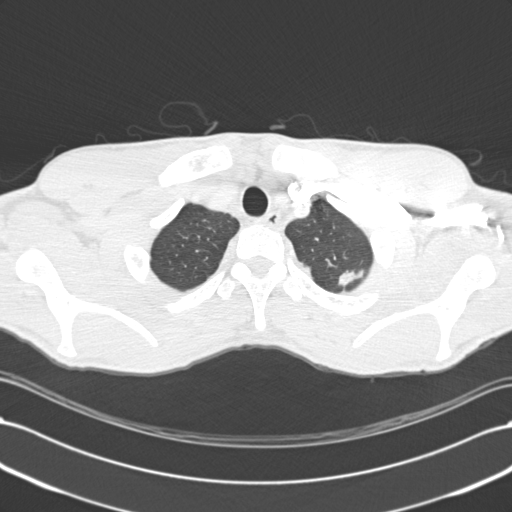
[im 234/247  mediastinal]
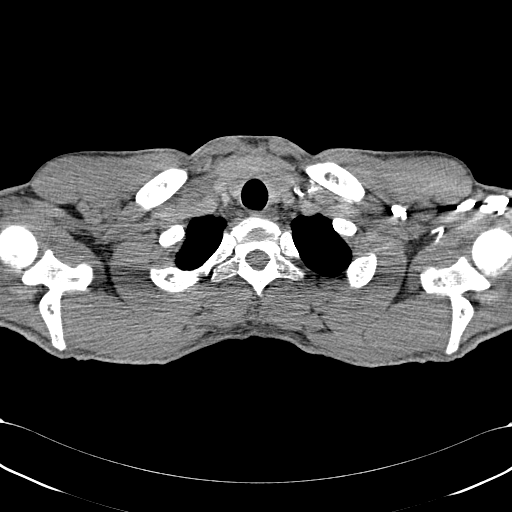

[18 of 36 positions shown; findings below may reference images not displayed]

FINDINGS: Enhancement of the pulmonary arteries is suboptimal. However, the study is diagnostic. Specifically, in the right lower lobe segmental branches at a bifurcation, there is a hypodense branching filling defect within the pulmonary arteries best visualized on the 1 mm axial views and confirmed on the sagittal and coronal reconstructions. These findings are consistent with a small nonocclusive right lower lobe acute pulmonary embolus.  No additional emboli identified.  Small right pleural effusion is noted.  No pericardial effusion.  The heart is enlarged.  No definite enlarged axillary or mediastinal lymph nodes. Residual thymic tissue is expected in the anterior mediastinum without mass effect. Thoracic aorta demonstrates no evidence of aneurysm.
Lung windows demonstrate worsening consolidation with central air bronchograms in the right lower lobe. Persistent patchy consolidative airspace disease is again noted in the left lower lobe, lingula, and to a lesser degree in the medial right upper lobe.  This right medial upper lobe area demonstrates some improvement.  Subpleural small nodular airspace density is also noted in the left upper lobe posteriorly.  Negative for edema or interstitial process. Central airways remain patent. 
Imaging of the upper abdomen demonstrates diffuse calcification of the spleen consistent with chronic autoinfarction. Skeletal changes of sickle cell disease are noted diffusely.
IMPRESSION: 1.  Suboptimal exam; however, nonocclusive right lower lobe pulmonary embolus is identified.
2.  Worsening consolidation with air bronchograms in the right lower lobe concerning for pneumonia and/or a component of pulmonary infarction.
3.  Bilateral patchy airspace disease and/or pneumonia in the left lower lobe, lingula and medial right upper lobe.

## 2009-11-07 IMAGING — CR DG CHEST 1V PORT
1 series · 1 of 1 positions shown · non-contrast
Comparison: 12/11/06.

CLINICAL DATA: Sickle cell crisis.
 PORTABLE CHEST - 1 VIEW:

[view not recorded]
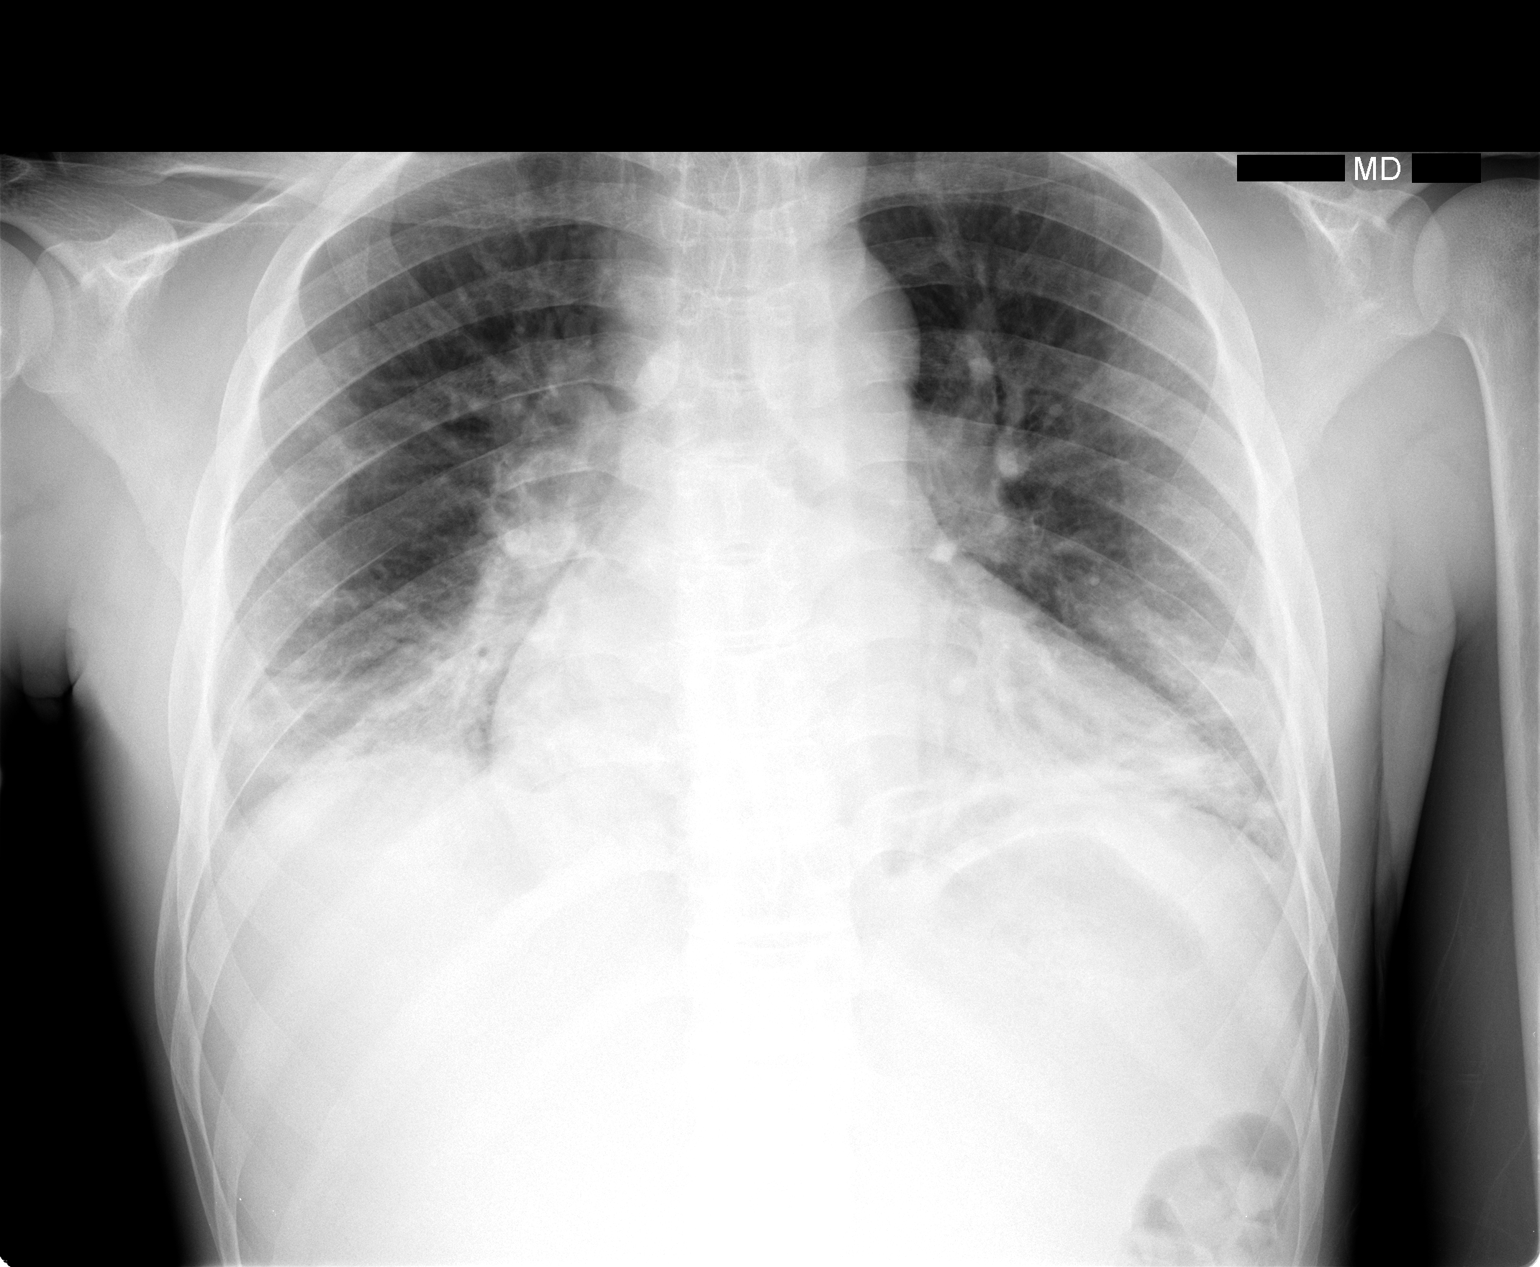

[1 of 1 positions shown; findings below may reference images not displayed]

FINDINGS: Interval worsening in bibasilar opacities.  Bibasilar atelectasis or infiltrates present.  There is cardiomegaly.  Low volumes.  Question small bilateral effusions.
IMPRESSION: 1.  Low lung volumes with interval development of bibasilar atelectasis or infiltrates.
 2.  Probable small bilateral effusions.
 3.  Cardiomegaly.

## 2009-11-13 IMAGING — CR DG ANKLE COMPLETE 3+V*L*
3 series · 3 of 3 positions shown · non-contrast
Comparison: None

CLINICAL DATA: Pain swelling. Sickle cell. No injury.

LEFT ANKLE - 3 VIEW

[t ankle joint ap left]
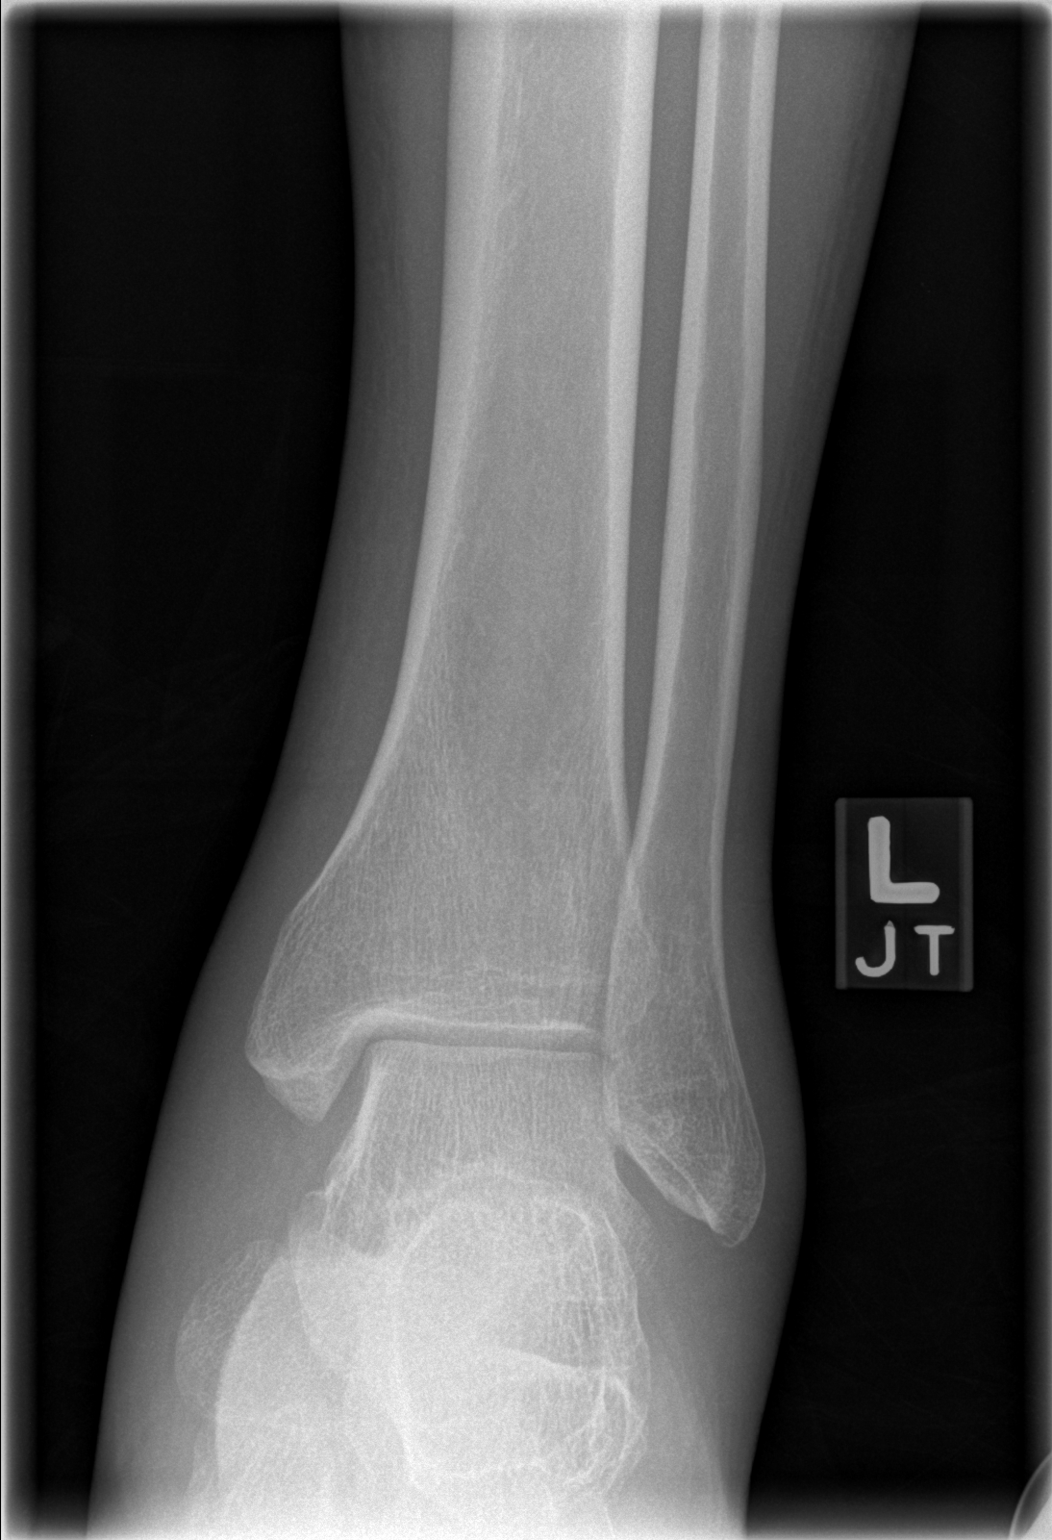

[t ankle joint oblique left]
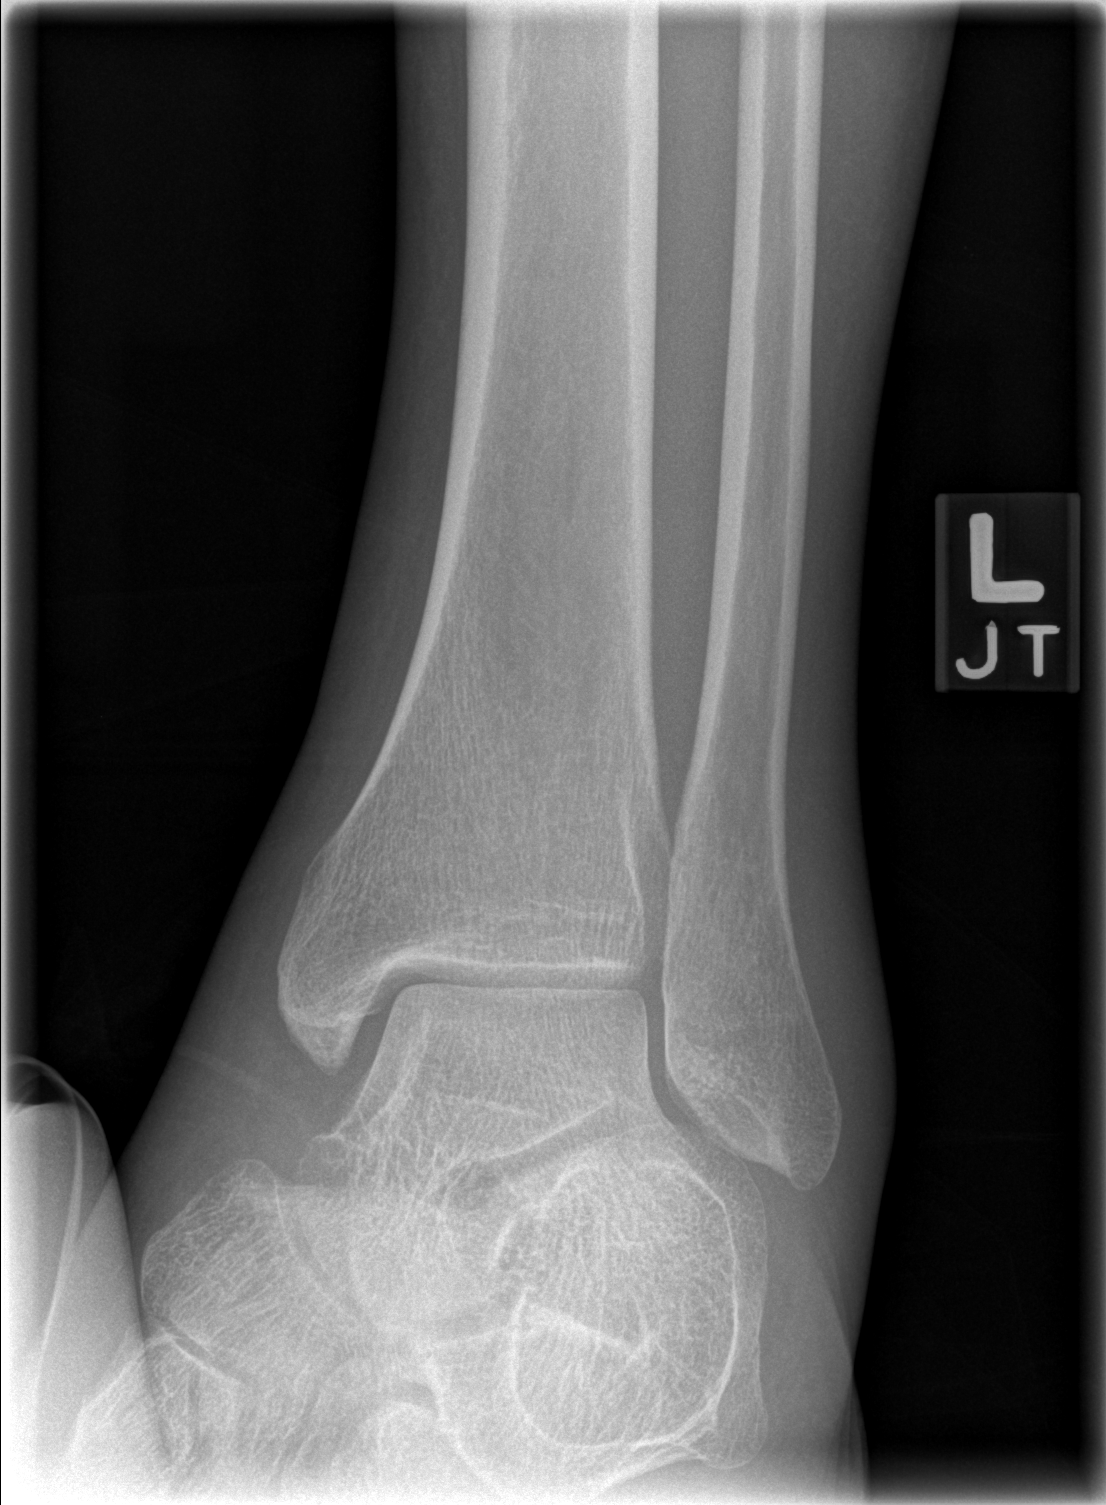

[t ankle joint lat left]
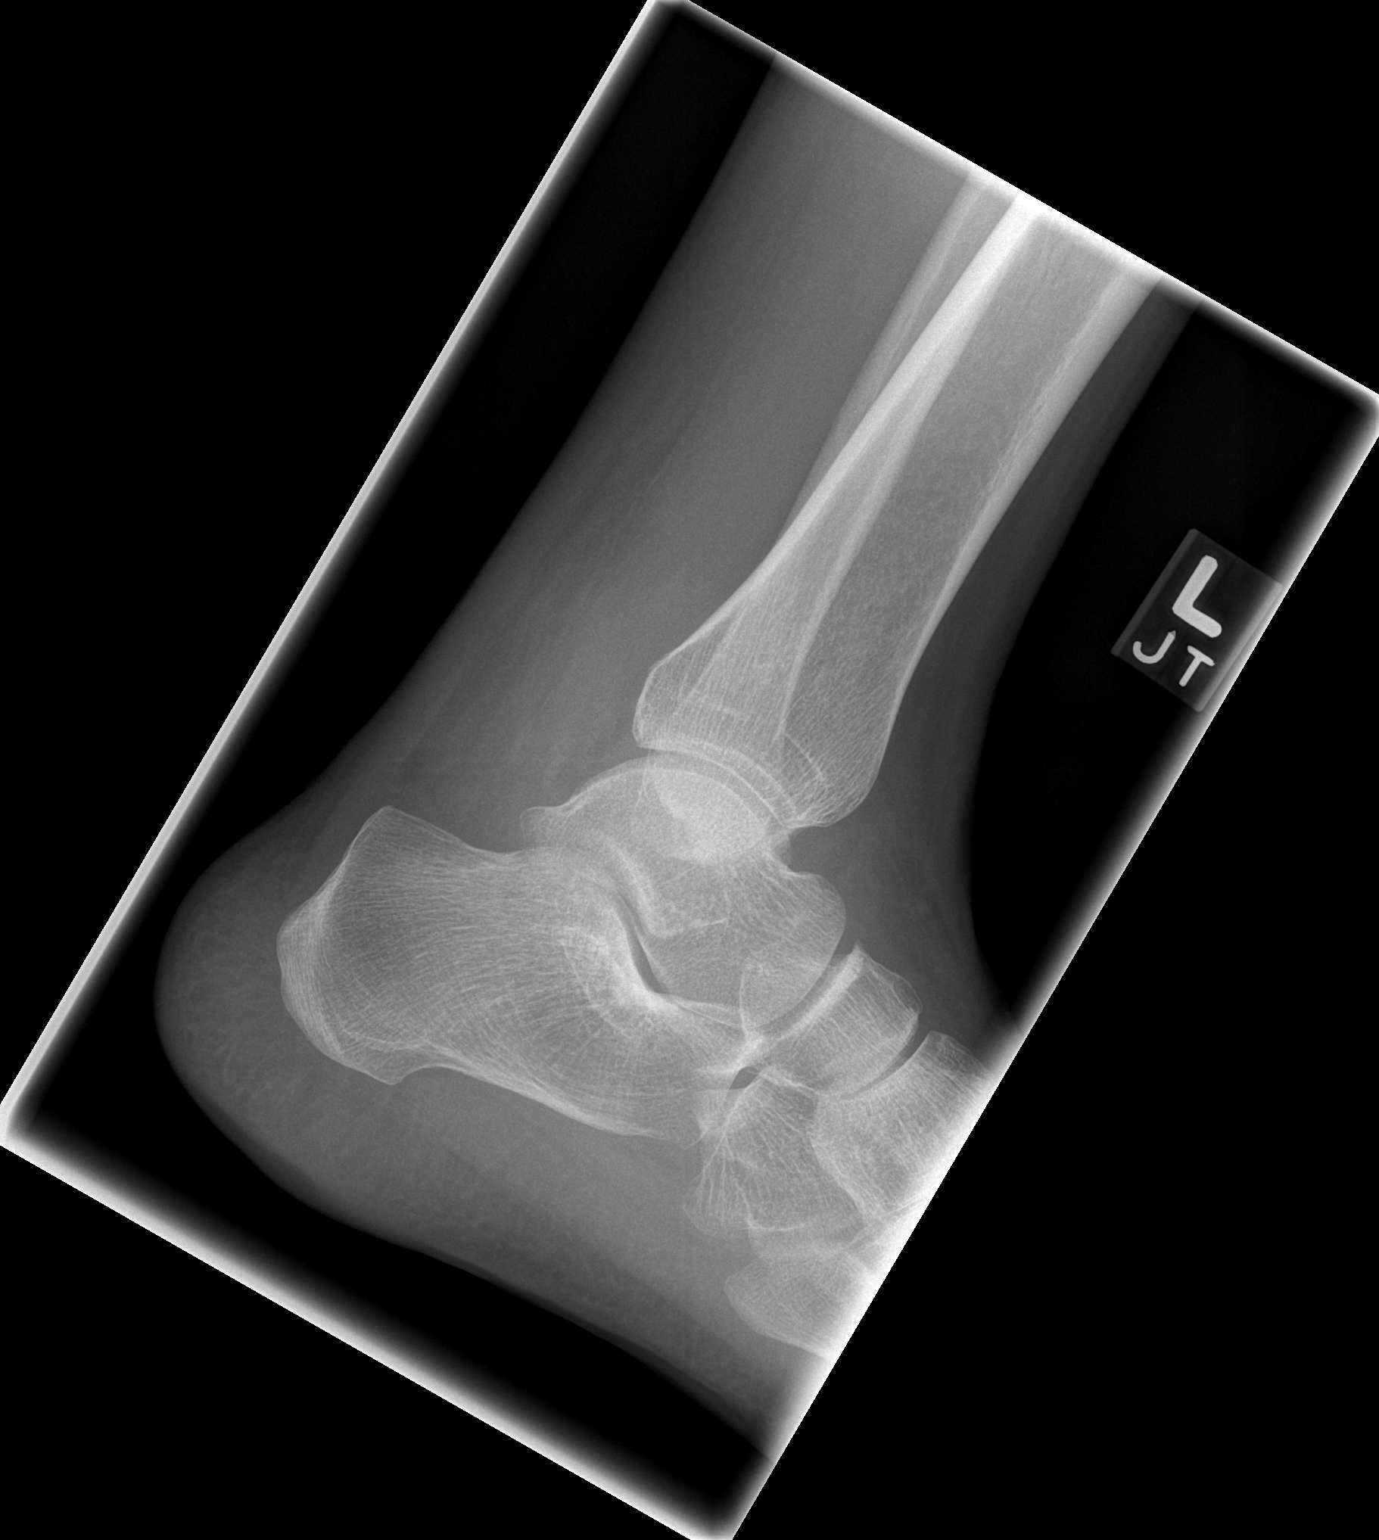

[3 of 3 positions shown; findings below may reference images not displayed]

FINDINGS: Mild diffuse soft tissue swelling greater on the medial than lateral
sides. No acute fracture or dislocation. No focal osseous lesion. Joint spaces
are well-maintained.

IMPRESSION

Mild diffuse soft tissue swelling without acute osseous abnormality.

## 2009-11-26 ENCOUNTER — Inpatient Hospital Stay (HOSPITAL_COMMUNITY): Admission: EM | Admit: 2009-11-26 | Discharge: 2009-12-08 | Payer: Self-pay | Admitting: Emergency Medicine

## 2009-12-07 IMAGING — CT CT ANGIO CHEST
2 of 4 series · 19 of 36 positions shown · IV contrast (APPLIED)
Comparison: 12/15/2006

CLINICAL DATA: Chest pain and positive d-dimer. Recent pulmonary embolism.  Not taking anticoagulants.   Sickle cell disease.  Evaluate for recurrent pulmonary embolism. 
 CT ANGIOGRAPHY OF CHEST:
TECHNIQUE: Multidetector CT imaging of the chest was performed during bolus injection of intravenous contrast.  Multiplanar CT angiographic image reconstructions were generated to evaluate the vascular anatomy.
 Contrast:  80 cc Omnipaque 300

[Series 7: thins for terarecon · axial · 0.61mm/px · z∈[+1243,+1463]mm · 16 of 246 slices shown]
[im 13/246  lung]
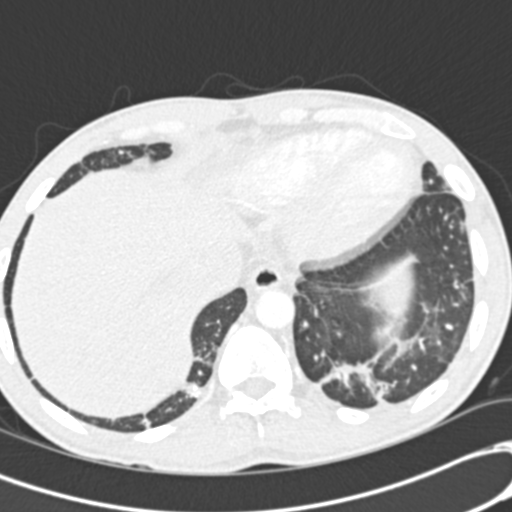
[im 25/246  mediastinal]
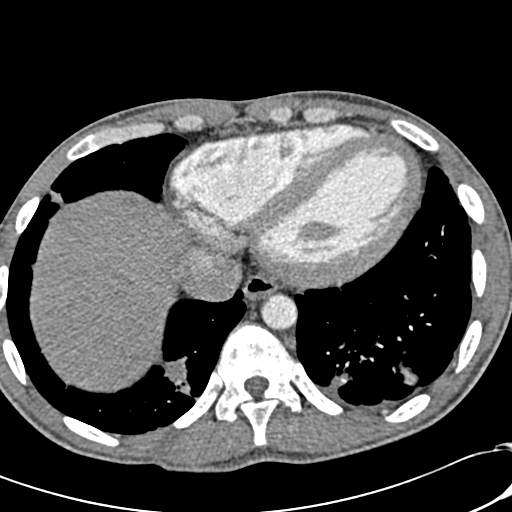
[im 37/246  lung]
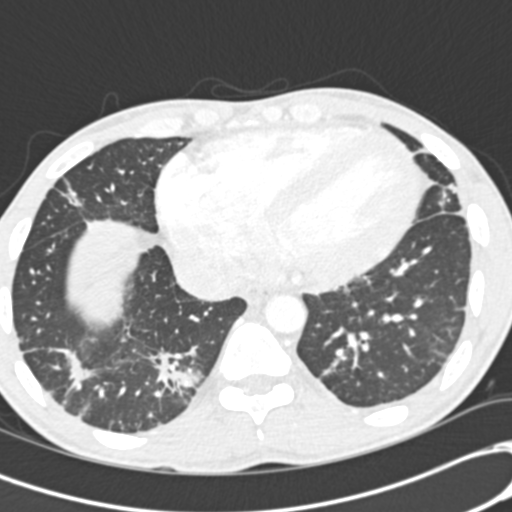
[im 62/246  mediastinal]
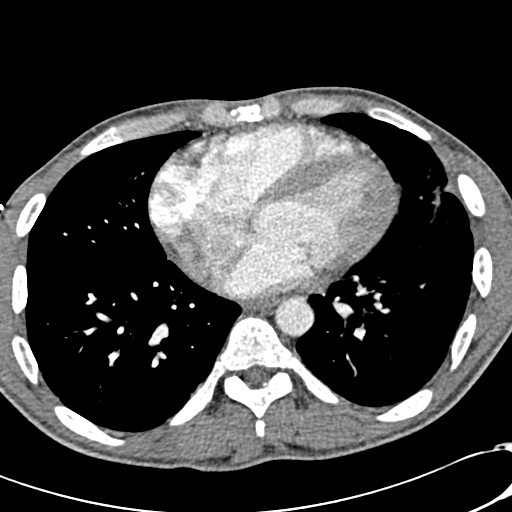
[im 74/246  lung]
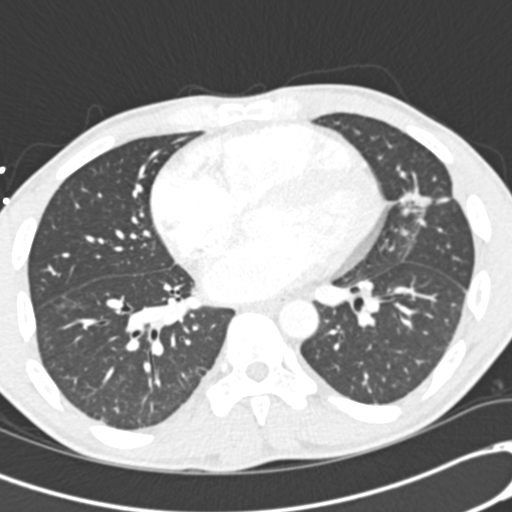
[im 86/246  mediastinal]
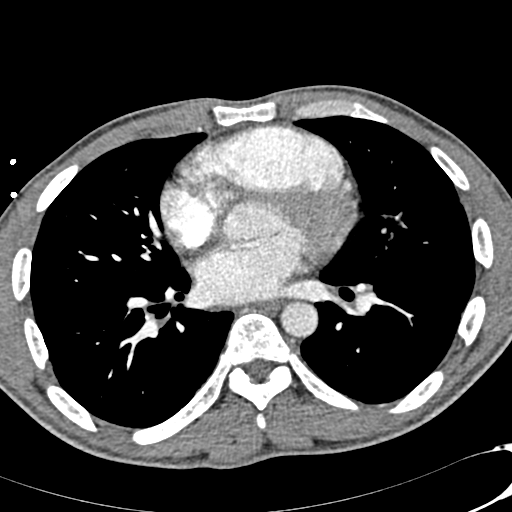
[im 99/246  lung]
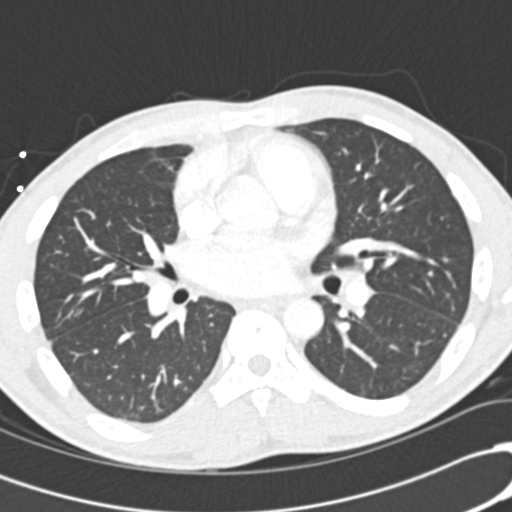
[im 111/246  mediastinal]
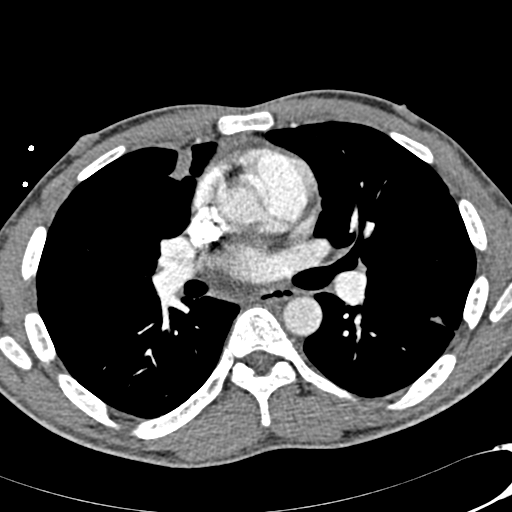
[im 135/246  lung]
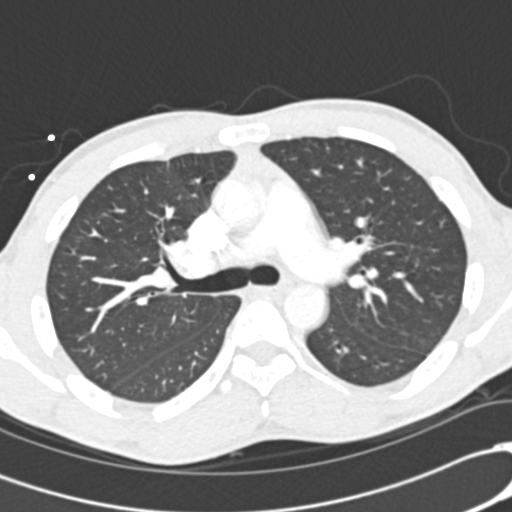
[im 148/246  mediastinal]
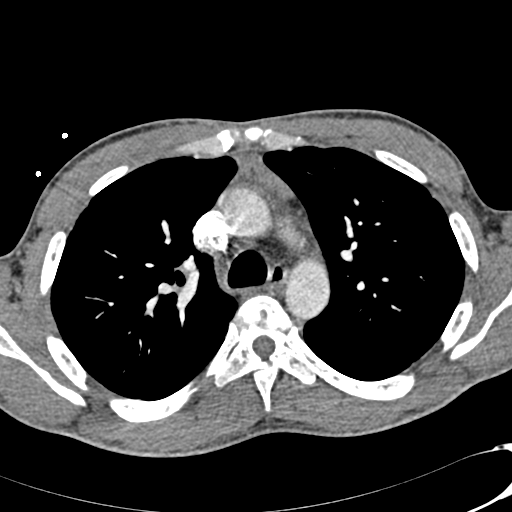
[im 160/246  lung]
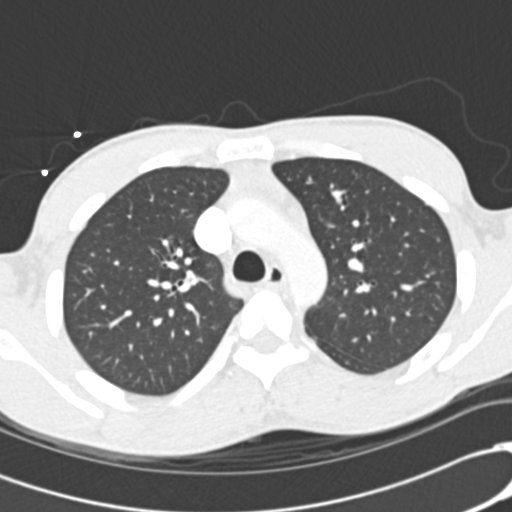
[im 172/246  mediastinal]
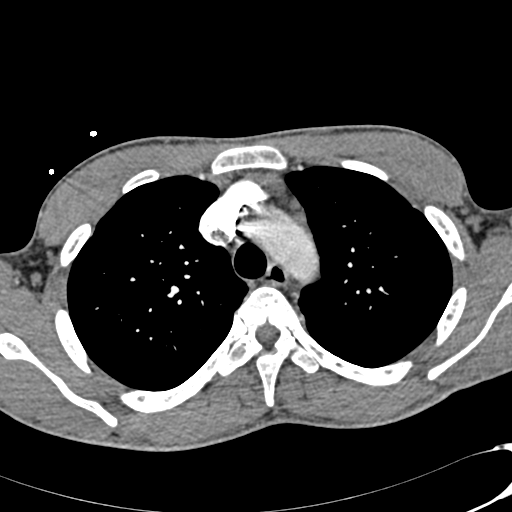
[im 184/246  lung]
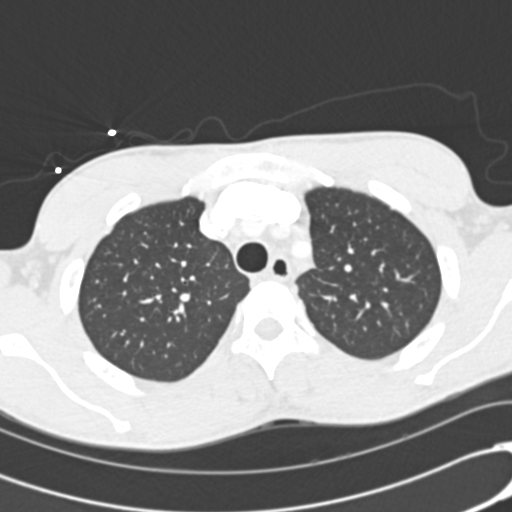
[im 209/246  mediastinal]
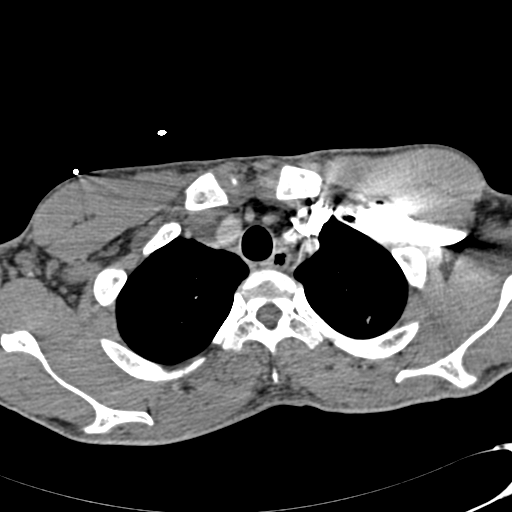
[im 221/246  lung]
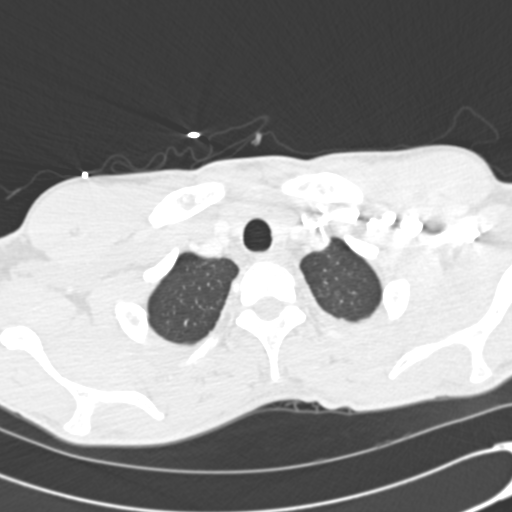
[im 233/246  mediastinal]
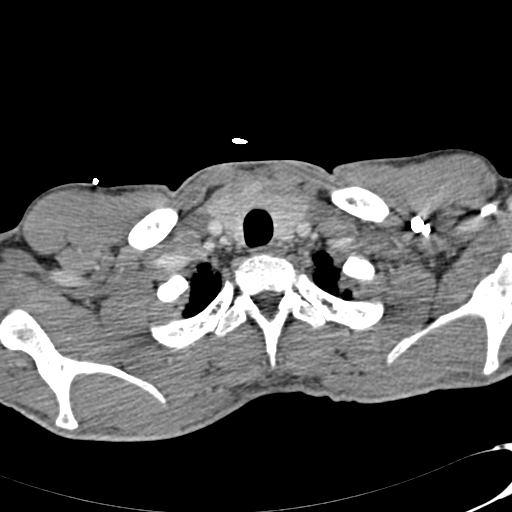

[Series 602: <mpr thick range> · coronal · 0.61mm/px · 3 of 190 slices shown]
[im 38/190  mediastinal]
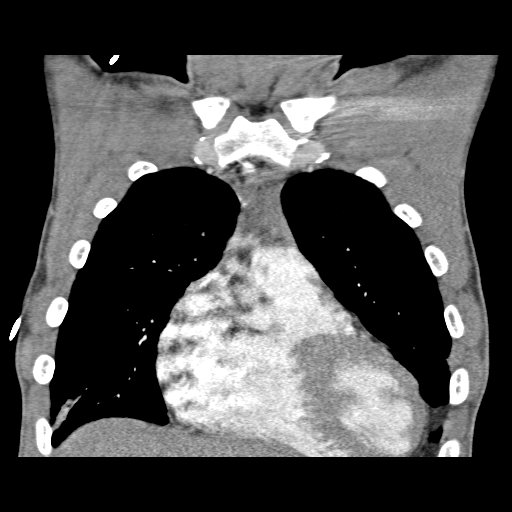
[im 76/190  mediastinal]
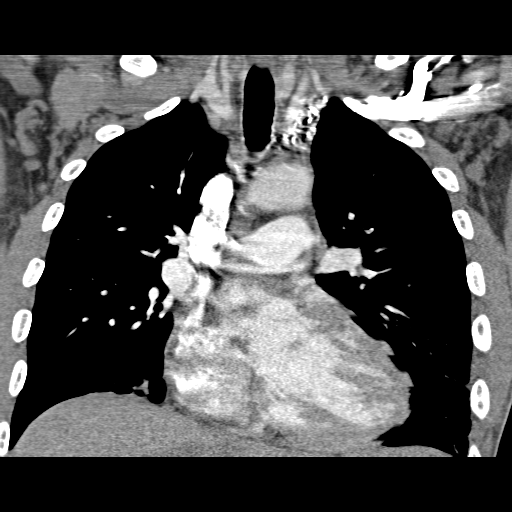
[im 114/190  mediastinal]
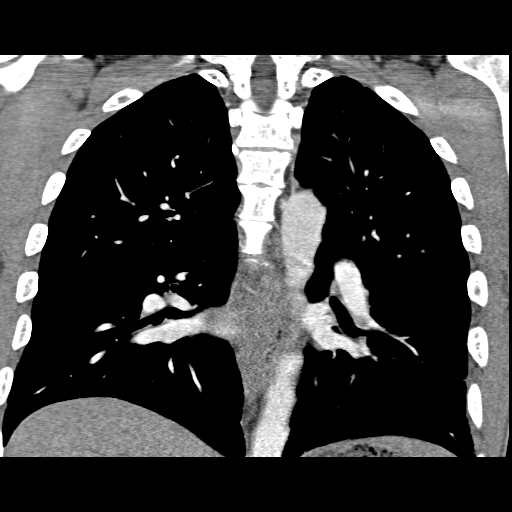

[19 of 36 positions shown; findings below may reference images not displayed]

FINDINGS: Satisfactory opacification of the pulmonary arteries is seen, and there is no evidence of acute pulmonary embolism.  There is no evidence of thoracic aortic aneurysm or dissection.  There is significant improvement in bilateral lower lung airspace disease since prior study.  No new or worsening areas of pulmonary infiltrate are seen.
IMPRESSION: 1.   No evidence of acute pulmonary embolism. 
 2.  Significant improvement in bilateral pulmonary infiltrates involving the lingula and both lower lobes since prior study.

## 2009-12-07 IMAGING — CR DG CHEST 2V
2 series · 2 of 2 positions shown · non-contrast
Comparison: Chest single view 12/15/06 and 11/15/06.

CLINICAL DATA: Sickle cell crisis.
 CHEST - 2 VIEW:

[w chest pa]
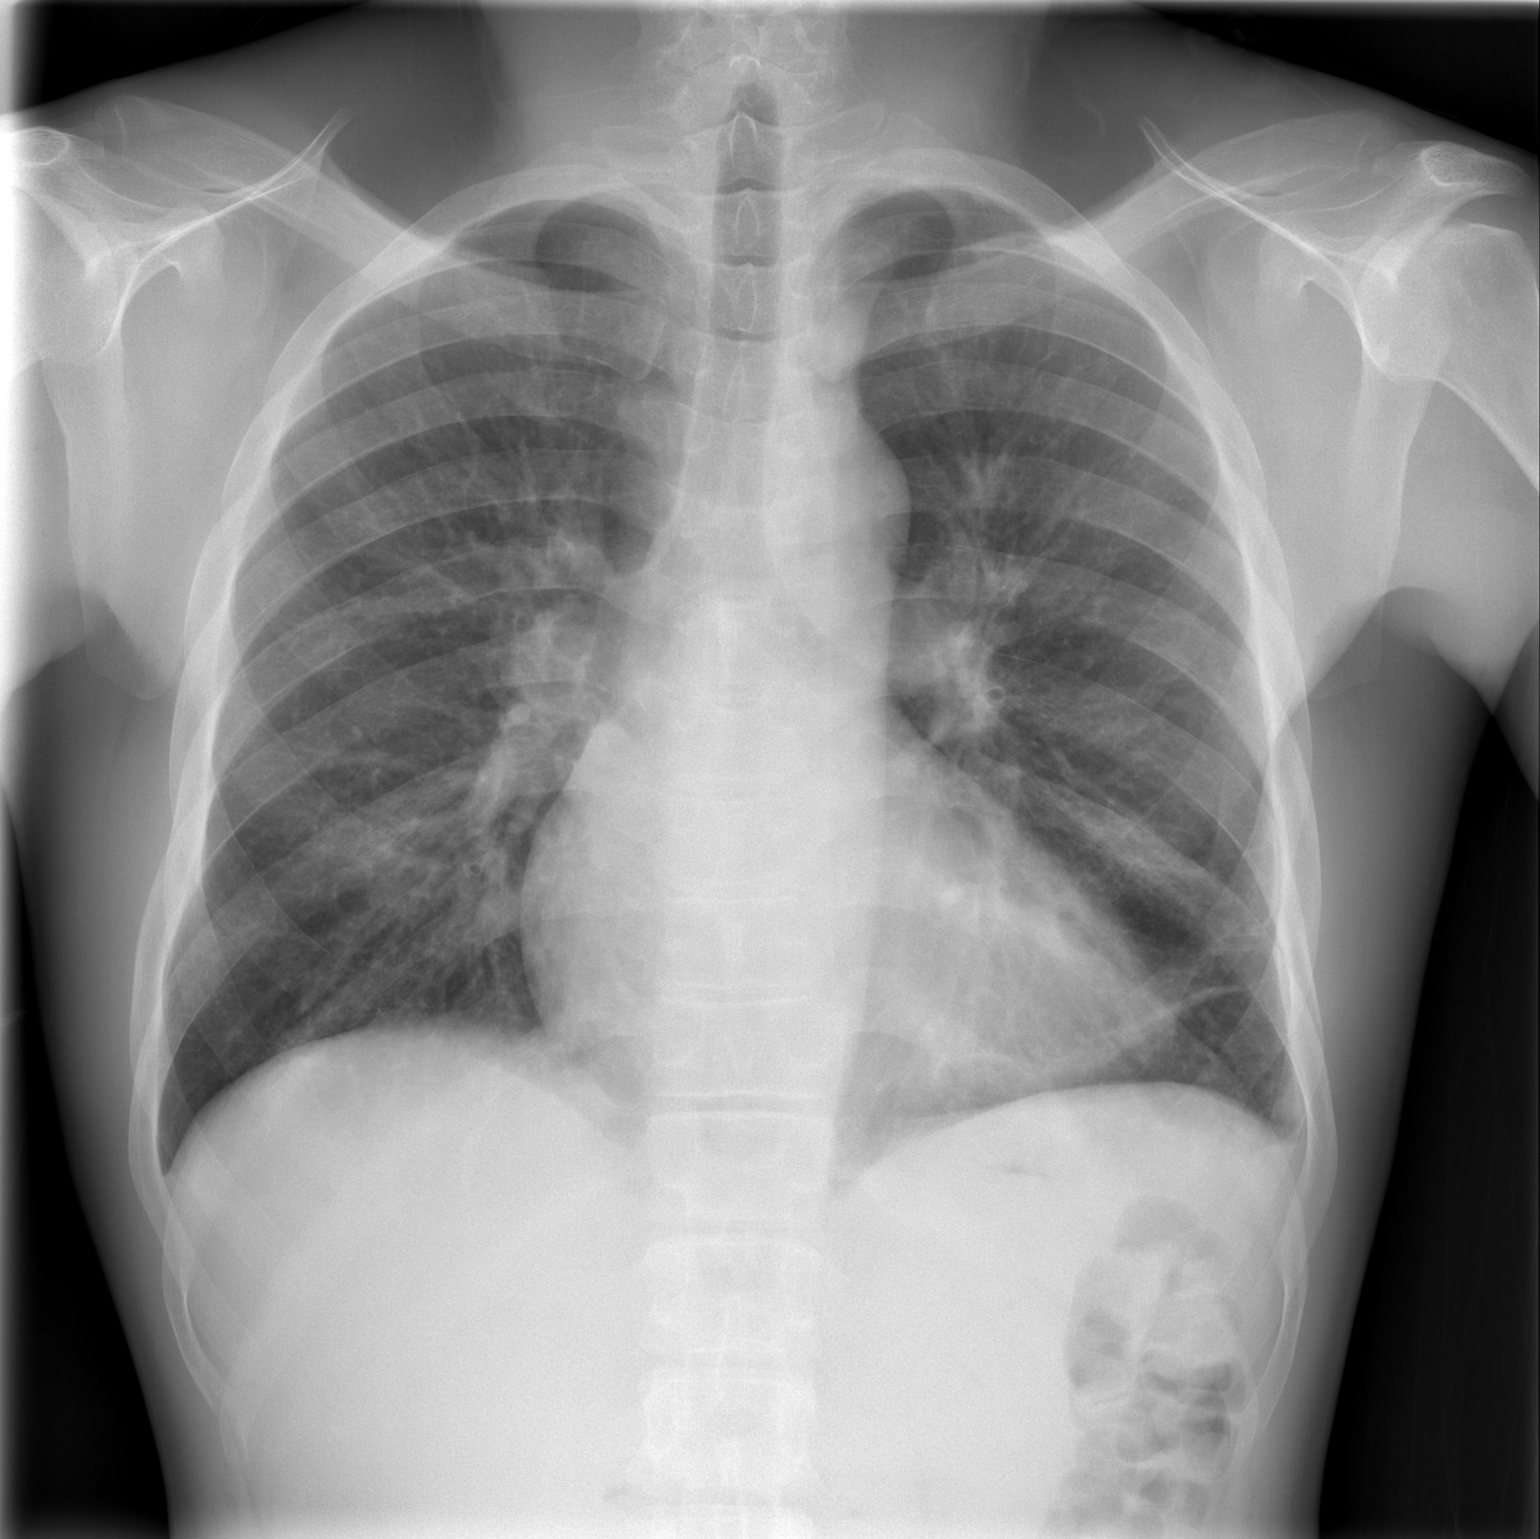

[w chest lat]
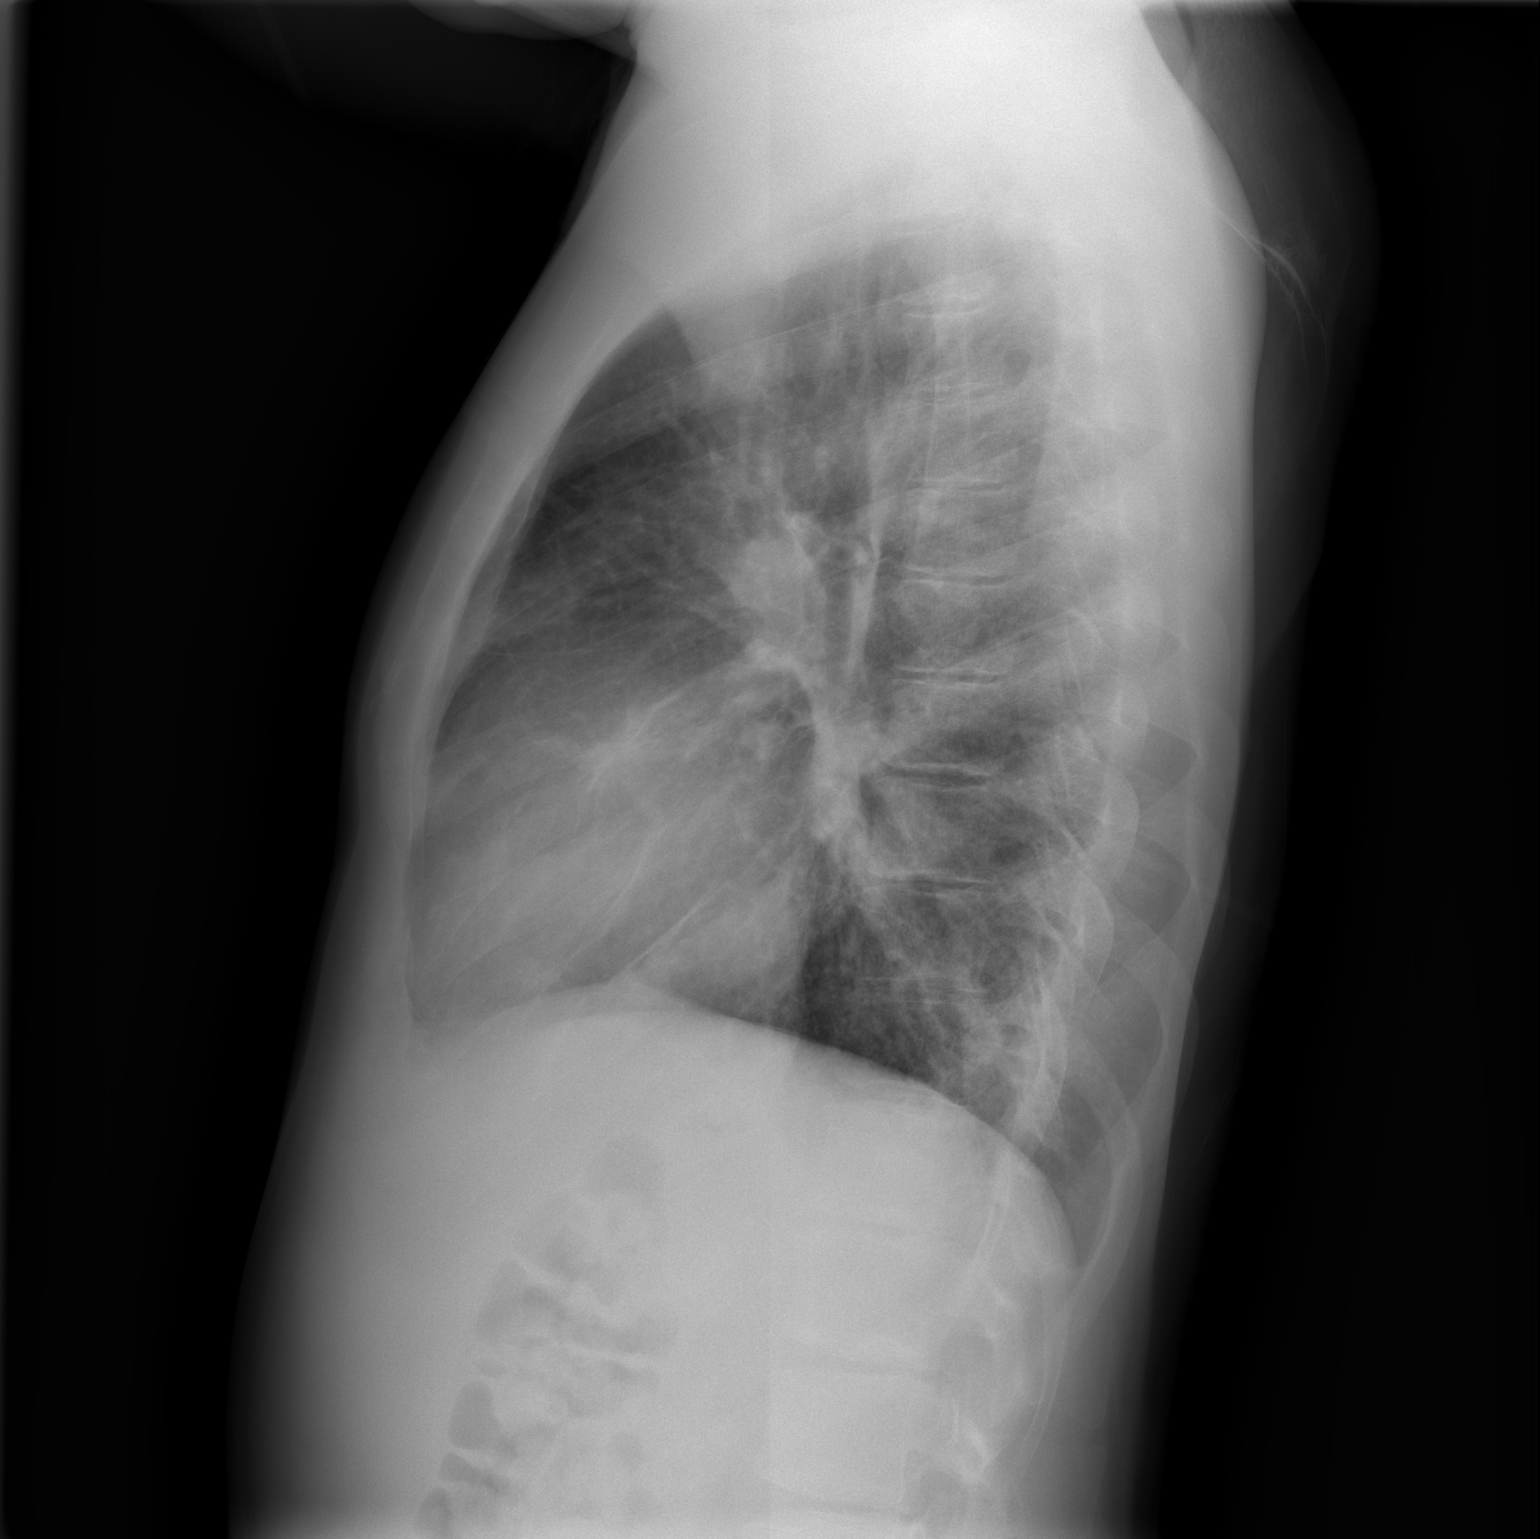

[2 of 2 positions shown; findings below may reference images not displayed]

FINDINGS: Mildly enlarged cardiac silhouette similar to prior. There is a band-like parenchymal density in the left mid lung as well as the right mid lung, which are similar to 12/11/06.  There is prominence of the central pulmonary vasculature and mild interstitial edema.  No evidence of pleural effusion.
IMPRESSION: 1.  Linear densities in both mid lungs are similar to prior and may represent chronic atelectasis; however, cannot rule [DATE].  Increased central venous pulmonary congestion as well as mild interstitial edema. 
 3.  Mild cardiomegaly, unchanged.

## 2009-12-21 ENCOUNTER — Inpatient Hospital Stay (HOSPITAL_COMMUNITY): Admission: EM | Admit: 2009-12-21 | Discharge: 2009-12-29 | Payer: Self-pay | Admitting: Emergency Medicine

## 2009-12-23 ENCOUNTER — Encounter: Payer: Self-pay | Admitting: Internal Medicine

## 2009-12-27 IMAGING — CR DG CHEST 2V
2 series · 2 of 2 positions shown · non-contrast
Comparison: 01/14/2007

CLINICAL DATA: Sickle cell crisis

[w chest pa]
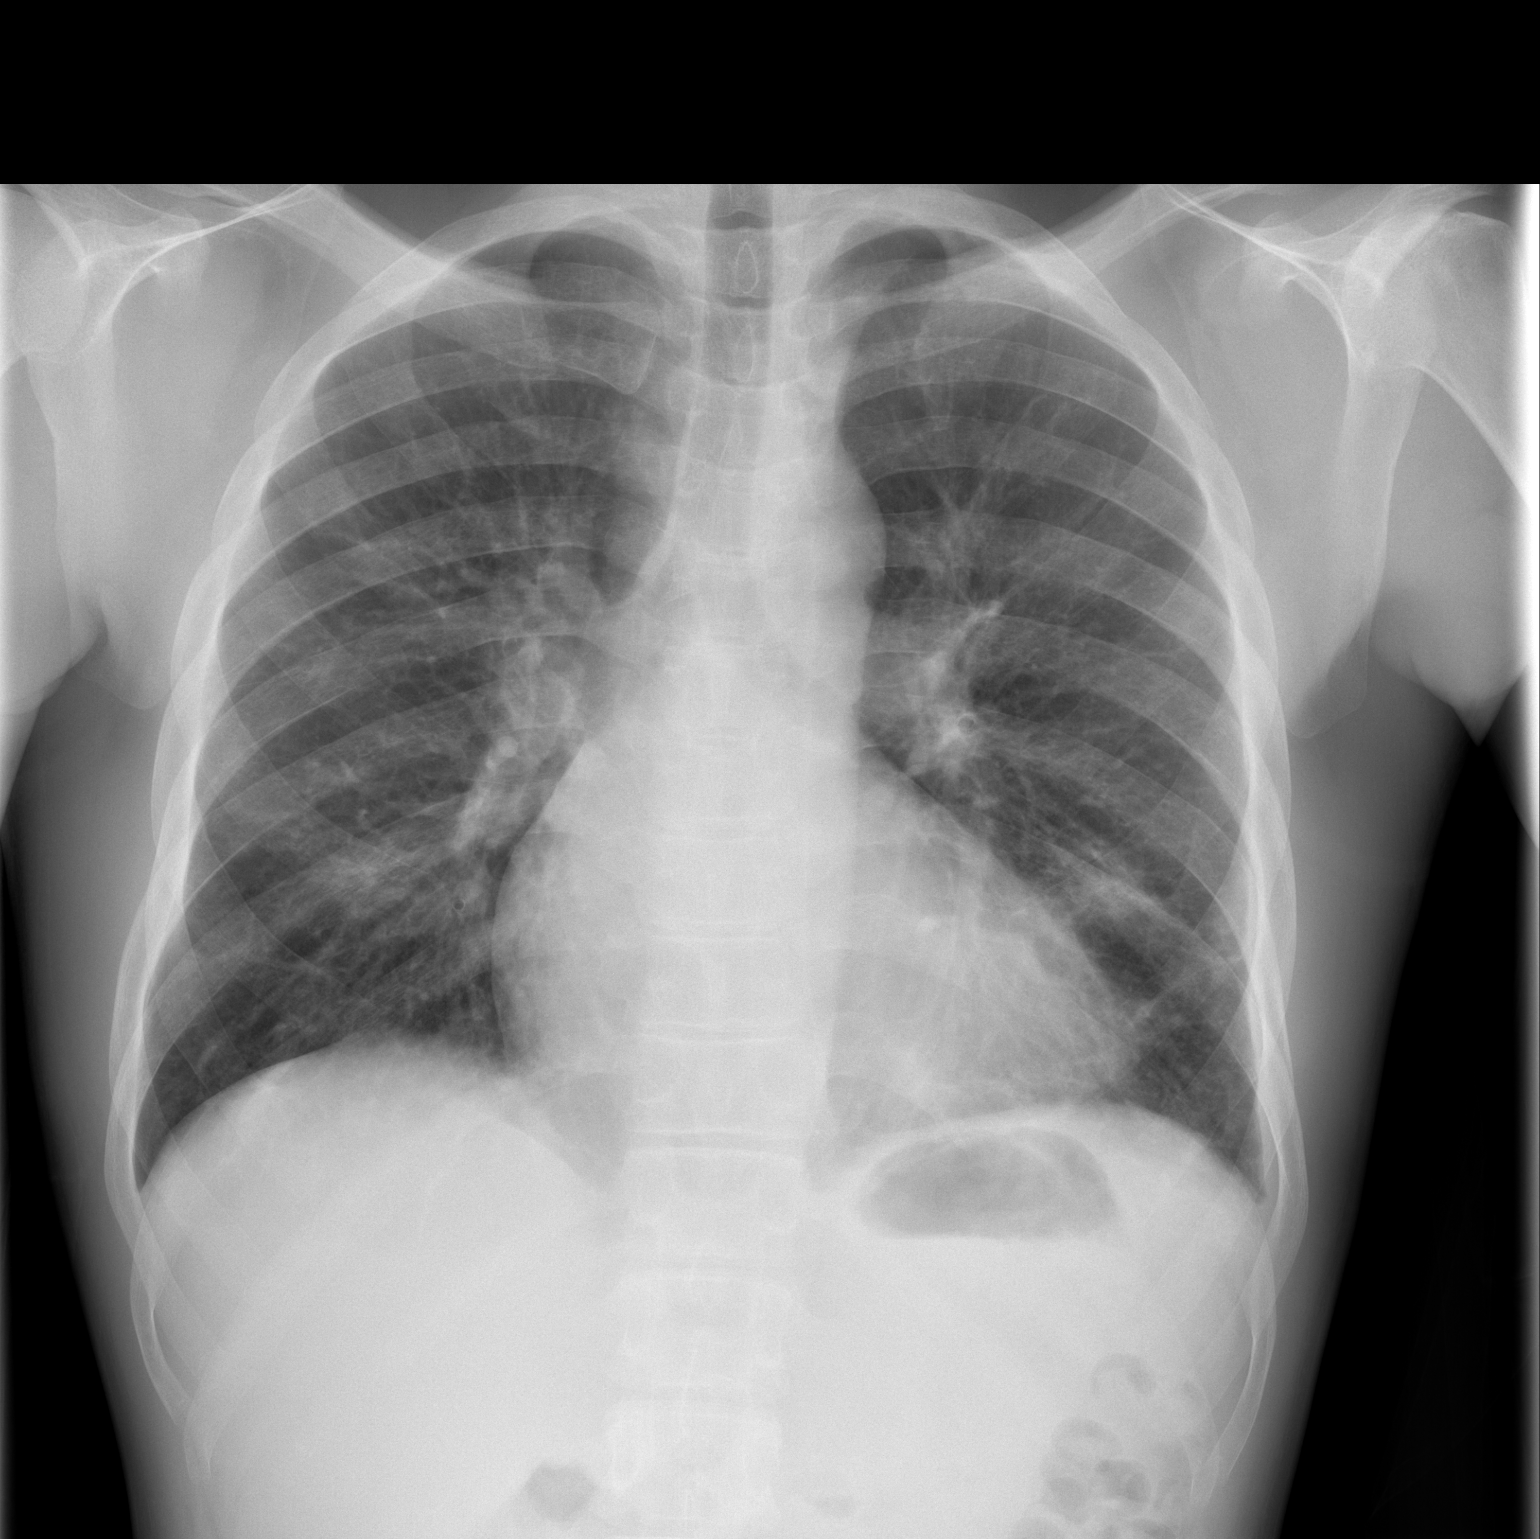

[w chest lat]
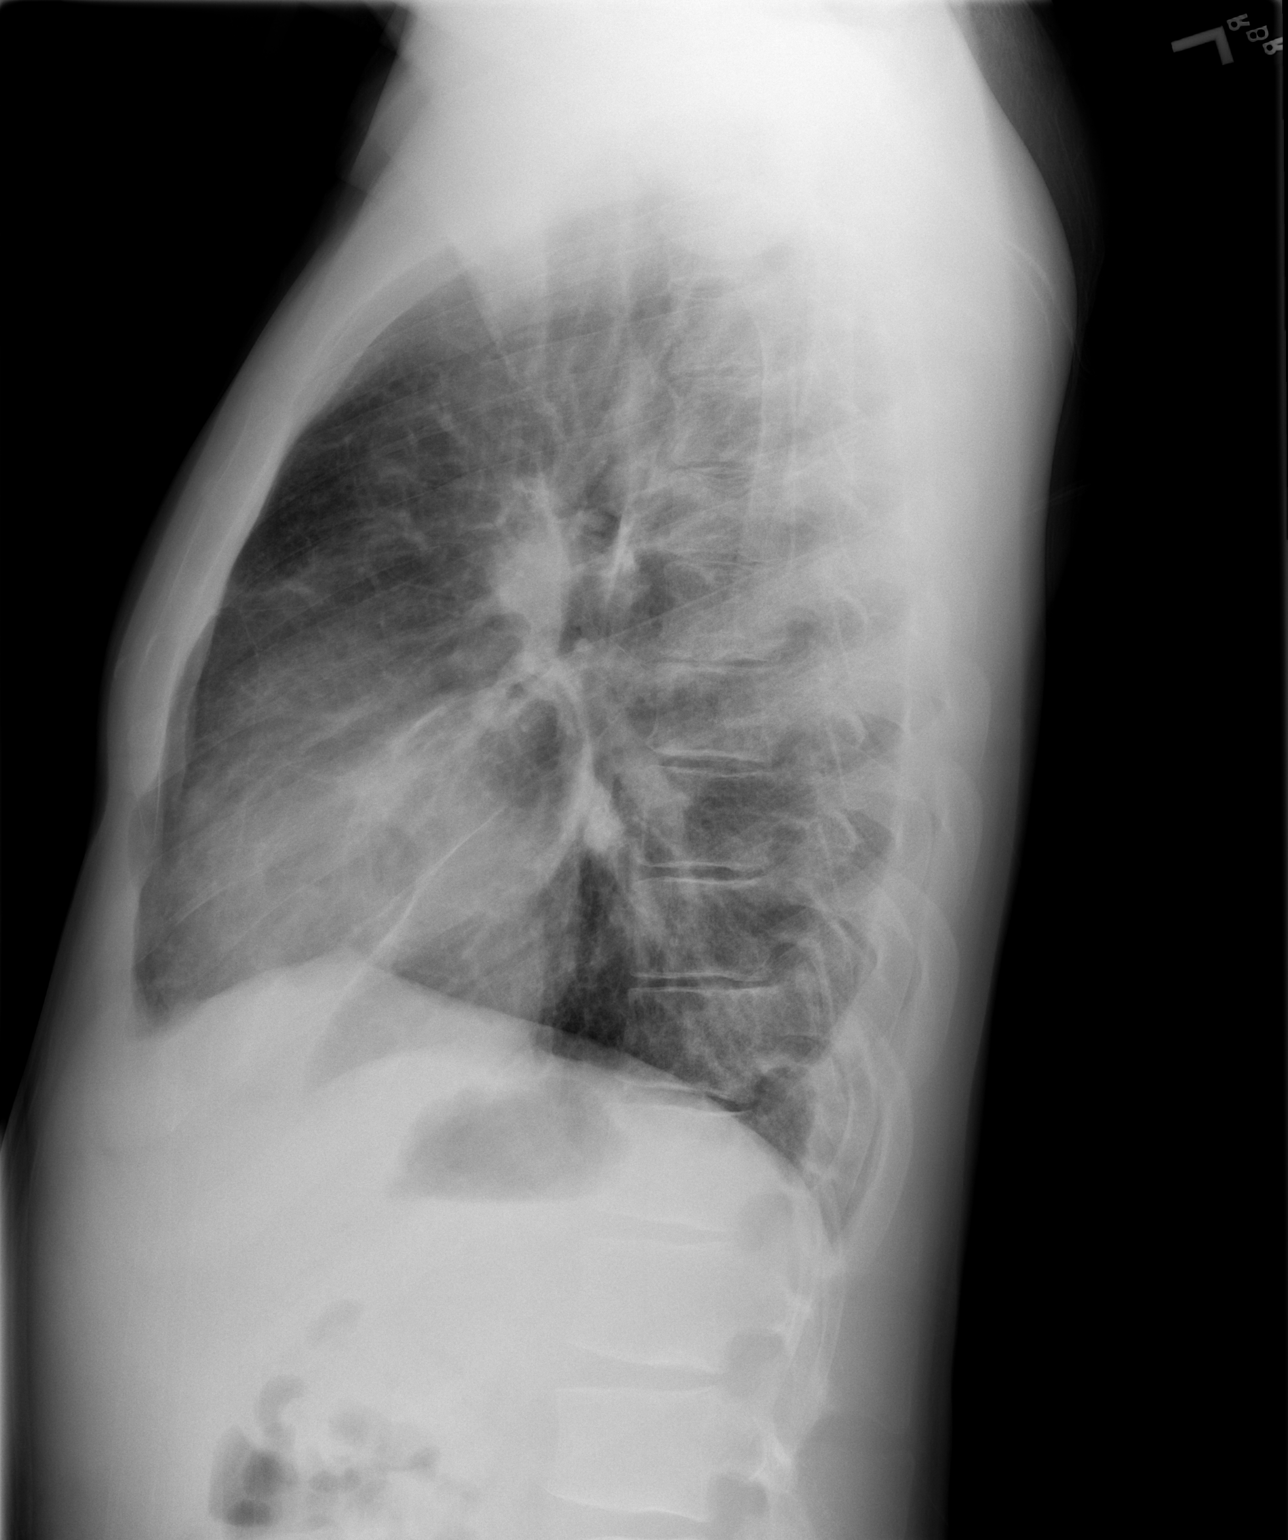

[2 of 2 positions shown; findings below may reference images not displayed]

CHEST - 2 VIEW:

Cardiopericardial silhouette is borderline enlarged. Parenchymal densities at
the lung bases are stable. No new atelectasis or infiltrate identified. No
evidence for pleural effusion. Visualized bony structures are intact.
IMPRESSION: Stable exam. Borderline cardiomegaly with interstitial coarsening and bibasilar
parenchymal densities, likely related to scarring.

## 2010-01-12 ENCOUNTER — Inpatient Hospital Stay (HOSPITAL_COMMUNITY)
Admission: EM | Admit: 2010-01-12 | Discharge: 2010-01-20 | Payer: Self-pay | Source: Home / Self Care | Attending: Internal Medicine | Admitting: Internal Medicine

## 2010-01-13 ENCOUNTER — Encounter: Payer: Self-pay | Admitting: Internal Medicine

## 2010-01-29 IMAGING — CT CT ANGIO CHEST
2 of 5 series · 19 of 36 positions shown · IV contrast (APPLIED)
Comparison: Plain films of the chest 03/08/07 and chest CT 01/14/07.

CLINICAL DATA: Right-sided chest pain inpatient with history of sickle cell disease.
 CT ANGIOGRAPHY OF CHEST (PULMONARY EMBOLISM PROTOCOL):
TECHNIQUE: Multidetector CT imaging of the chest was performed according to the protocol for detection of pulmonary embolism during bolus injection of intravenous contrast.  Coronal and sagittal plane CT angiographic image reconstructions were also generated.
 Contrast:  100 cc Omnipaque 300.

[Series 6: pulm embolism 2.0 cor · coronal · 0.64mm/px · 3 of 98 slices shown]
[im 20/98  mediastinal]
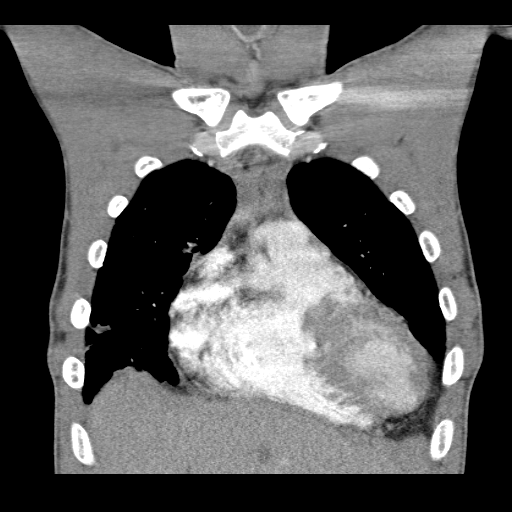
[im 39/98  mediastinal]
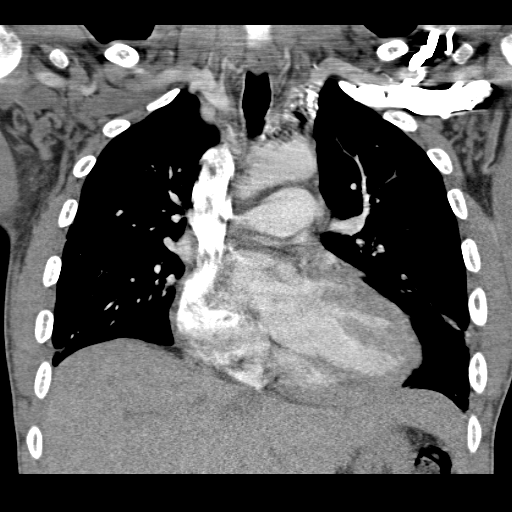
[im 59/98  mediastinal]
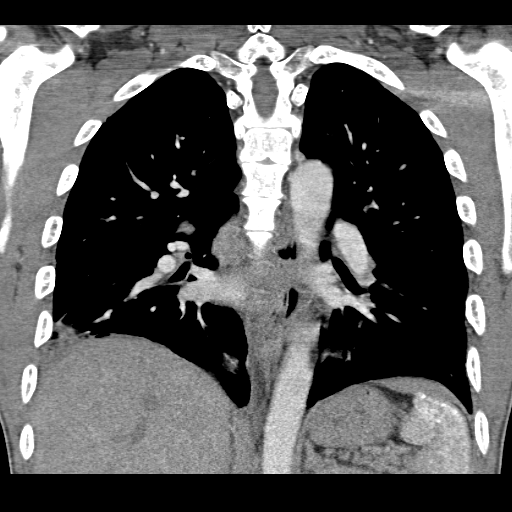

[Series 9: pulm embolism 1.0 thins · axial · 0.65mm/px · z∈[-268,-12]mm · 16 of 288 slices shown]
[im 16/288  lung]
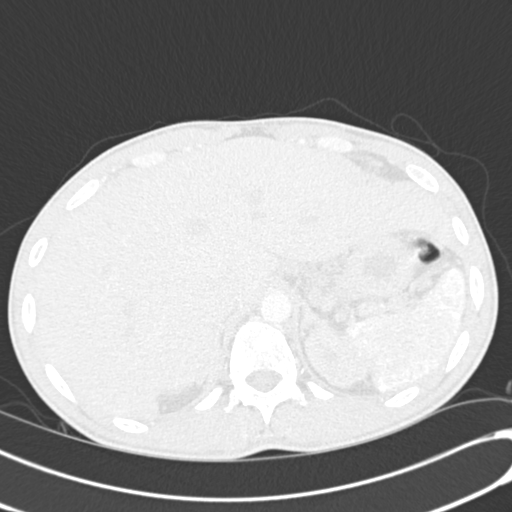
[im 31/288  mediastinal]
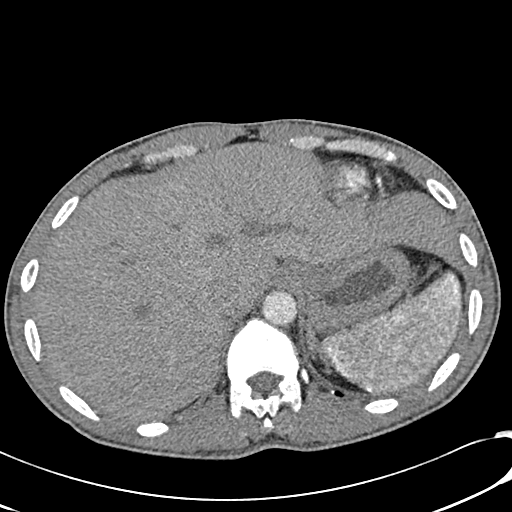
[im 46/288  lung]
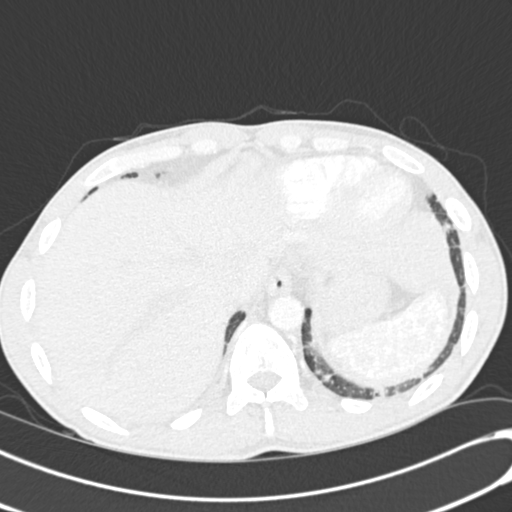
[im 61/288  mediastinal]
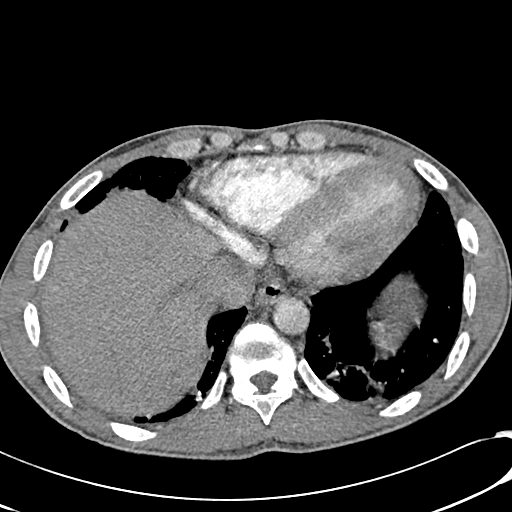
[im 91/288  lung]
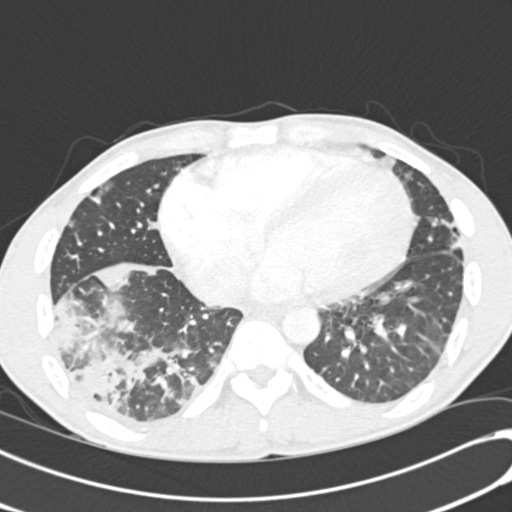
[im 106/288  mediastinal]
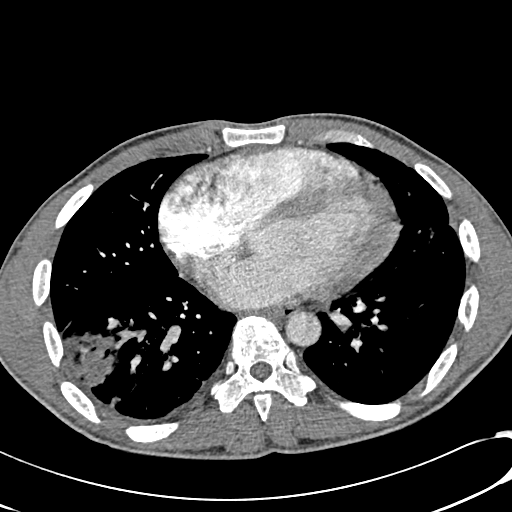
[im 121/288  lung]
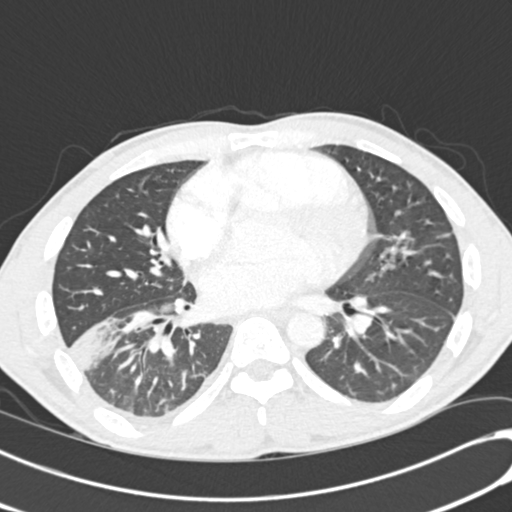
[im 136/288  mediastinal]
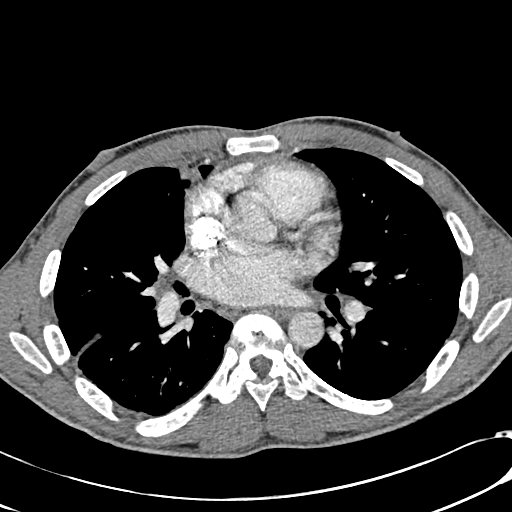
[im 152/288  lung]
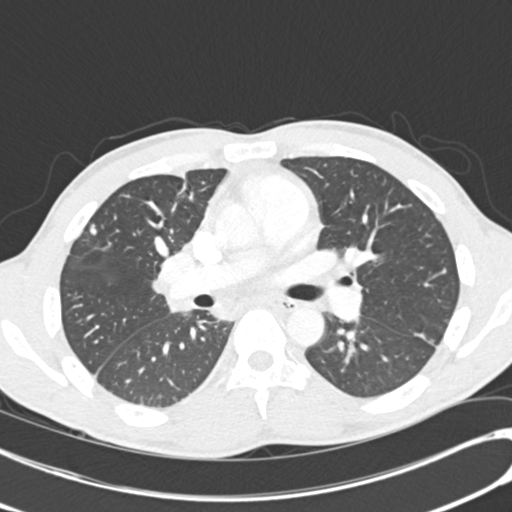
[im 167/288  mediastinal]
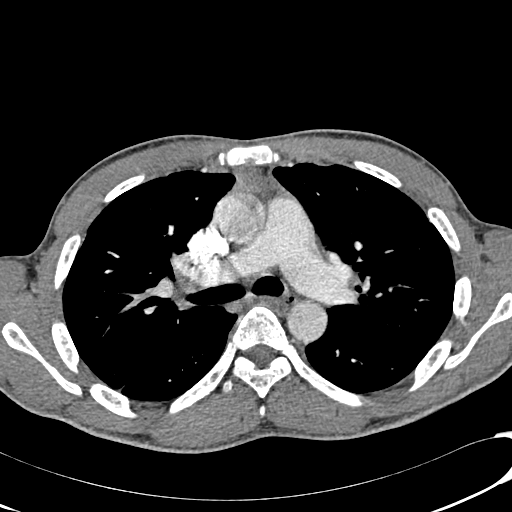
[im 182/288  lung]
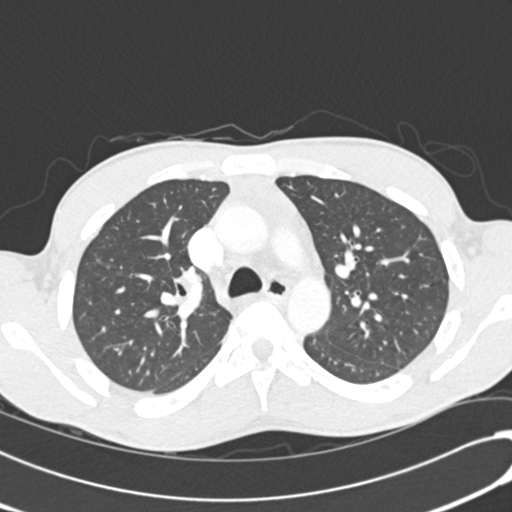
[im 197/288  mediastinal]
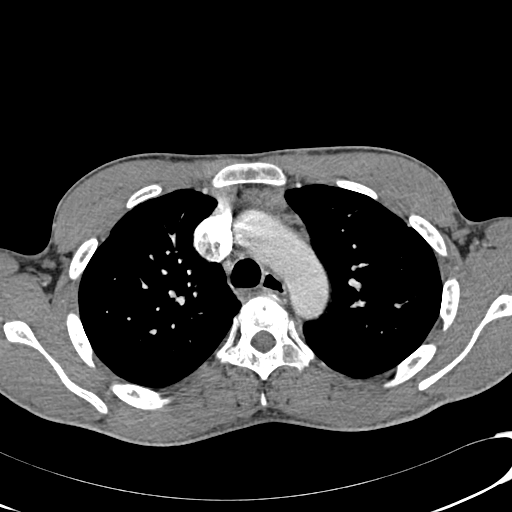
[im 227/288  lung]
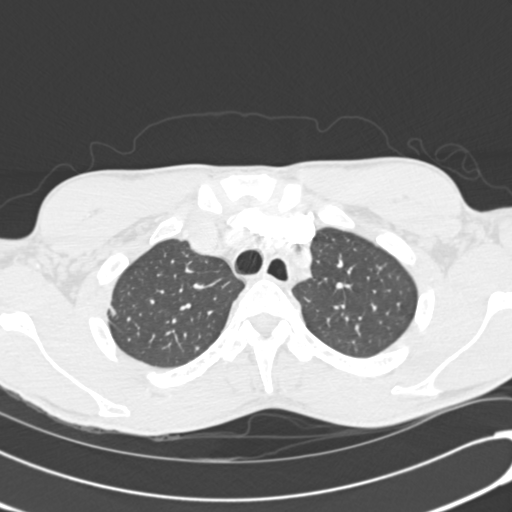
[im 242/288  mediastinal]
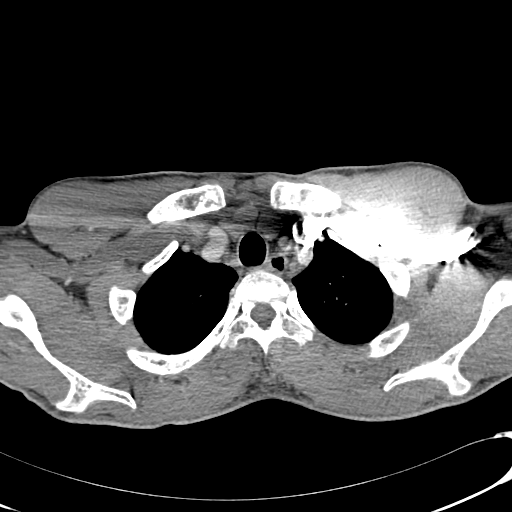
[im 257/288  lung]
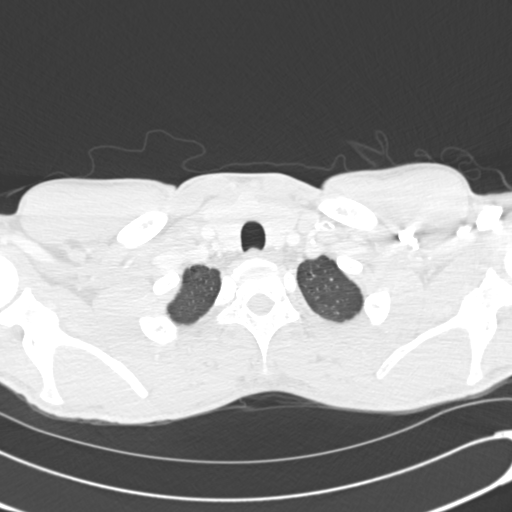
[im 272/288  mediastinal]
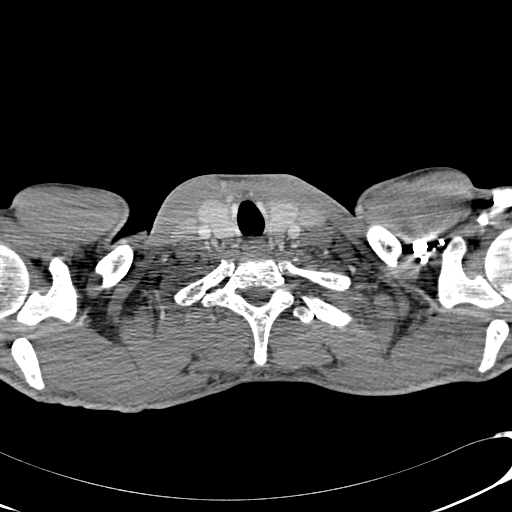

[19 of 36 positions shown; findings below may reference images not displayed]

FINDINGS: There is no CT evidence of pulmonary embolism.  There is some new right hilar lymphadenopathy with a node identified measuring 2.0 x 1.6 cm. No left hilar or mediastinal lymphadenopathy.  Thymus is somewhat prominent but unchanged.  No pleural or pericardial effusion.  There is cardiomegaly. The lungs demonstrate new airspace disease in the right lower lobe with air bronchograms.  Patchy opacity in the left base is not markedly changed and likely represents scar.  There is also small focus of airspace opacity in the right upper lobe which is new. Lungs are otherwise unremarkable.  Incidentally imaged upper abdomen is unremarkable. There is increased attenuation of the spleen compatible with calcification and evolving autoinfarction.   Multifocal areas of bony sclerosis are consistent with infarcts. Lungs are otherwise unremarkable.
IMPRESSION: 1.  Negative for pulmonary embolus.
 2.  New right lower lobe pneumonia with patchy airspace disease in the right upper lobe also identified.
 3.  Basilar pulmonary scarring more notable on the left.

## 2010-01-31 ENCOUNTER — Emergency Department (HOSPITAL_COMMUNITY)
Admission: EM | Admit: 2010-01-31 | Discharge: 2010-01-31 | Payer: Self-pay | Source: Home / Self Care | Admitting: Emergency Medicine

## 2010-02-02 ENCOUNTER — Inpatient Hospital Stay (HOSPITAL_COMMUNITY)
Admission: EM | Admit: 2010-02-02 | Discharge: 2010-02-09 | Payer: Self-pay | Source: Home / Self Care | Attending: Internal Medicine | Admitting: Internal Medicine

## 2010-02-03 LAB — BASIC METABOLIC PANEL
BUN: 3 mg/dL — ABNORMAL LOW (ref 6–23)
CO2: 20 mEq/L (ref 19–32)
Calcium: 8.4 mg/dL (ref 8.4–10.5)
Chloride: 113 mEq/L — ABNORMAL HIGH (ref 96–112)
Creatinine, Ser: 0.56 mg/dL (ref 0.4–1.5)
GFR calc Af Amer: >60 mL/min (ref >60)
GFR calc non Af Amer: >60 mL/min (ref >60)
Glucose, Bld: 118 mg/dL — ABNORMAL HIGH (ref 70–99)
Potassium: 3.4 mEq/L — ABNORMAL LOW (ref 3.5–5.1)
Sodium: 140 mEq/L (ref 135–145)

## 2010-02-03 LAB — CBC
HCT: 21.4 % — ABNORMAL LOW (ref 39.0–52.0)
Hemoglobin: 7 g/dL — ABNORMAL LOW (ref 13.0–17.0)
MCH: 31.3 pg (ref 26.0–34.0)
MCHC: 32.7 g/dL (ref 30.0–36.0)
MCV: 95.5 fL (ref 78.0–100.0)
Platelets: 500 10*3/uL — ABNORMAL HIGH (ref 150–400)
RBC: 2.24 MIL/uL — ABNORMAL LOW (ref 4.22–5.81)
RDW: 19 % — ABNORMAL HIGH (ref 11.5–15.5)
WBC: 20.4 10*3/uL — ABNORMAL HIGH (ref 4.0–10.5)

## 2010-02-03 LAB — MAGNESIUM: Magnesium: 1.7 mg/dL (ref 1.5–2.5)

## 2010-02-03 LAB — TECHNOLOGIST SMEAR REVIEW

## 2010-02-04 LAB — COMPREHENSIVE METABOLIC PANEL
ALT: 13 U/L (ref 0–53)
AST: 21 U/L (ref 0–37)
Albumin: 4 g/dL (ref 3.5–5.2)
Alkaline Phosphatase: 81 U/L (ref 39–117)
BUN: 4 mg/dL — ABNORMAL LOW (ref 6–23)
CO2: 21 mEq/L (ref 19–32)
Calcium: 9.4 mg/dL (ref 8.4–10.5)
Chloride: 114 mEq/L — ABNORMAL HIGH (ref 96–112)
Creatinine, Ser: 0.56 mg/dL (ref 0.4–1.5)
GFR calc Af Amer: >60 mL/min (ref >60)
GFR calc non Af Amer: >60 mL/min (ref >60)
Glucose, Bld: 101 mg/dL — ABNORMAL HIGH (ref 70–99)
Potassium: 4.1 mEq/L (ref 3.5–5.1)
Sodium: 141 mEq/L (ref 135–145)
Total Bilirubin: 3.5 mg/dL — ABNORMAL HIGH (ref 0.3–1.2)
Total Protein: 7.7 g/dL (ref 6.0–8.3)

## 2010-02-04 LAB — CBC
HCT: 23.7 % — ABNORMAL LOW (ref 39.0–52.0)
Hemoglobin: 7.8 g/dL — ABNORMAL LOW (ref 13.0–17.0)
MCH: 31.1 pg (ref 26.0–34.0)
MCHC: 32.9 g/dL (ref 30.0–36.0)
MCV: 94.4 fL (ref 78.0–100.0)
Platelets: 562 10*3/uL — ABNORMAL HIGH (ref 150–400)
RBC: 2.51 MIL/uL — ABNORMAL LOW (ref 4.22–5.81)
RDW: 18.4 % — ABNORMAL HIGH (ref 11.5–15.5)
WBC: 20.6 10*3/uL — ABNORMAL HIGH (ref 4.0–10.5)

## 2010-02-05 LAB — CBC
HCT: 21.7 % — ABNORMAL LOW (ref 39.0–52.0)
Hemoglobin: 7.2 g/dL — ABNORMAL LOW (ref 13.0–17.0)
MCH: 31.6 pg (ref 26.0–34.0)
MCHC: 33.2 g/dL (ref 30.0–36.0)
MCV: 95.2 fL (ref 78.0–100.0)
Platelets: 534 10*3/uL — ABNORMAL HIGH (ref 150–400)
RBC: 2.28 MIL/uL — ABNORMAL LOW (ref 4.22–5.81)
RDW: 17.8 % — ABNORMAL HIGH (ref 11.5–15.5)
WBC: 16.3 10*3/uL — ABNORMAL HIGH (ref 4.0–10.5)

## 2010-02-05 LAB — COMPREHENSIVE METABOLIC PANEL
ALT: 15 U/L (ref 0–53)
AST: 29 U/L (ref 0–37)
Albumin: 3.9 g/dL (ref 3.5–5.2)
Alkaline Phosphatase: 79 U/L (ref 39–117)
BUN: 4 mg/dL — ABNORMAL LOW (ref 6–23)
CO2: 22 mEq/L (ref 19–32)
Calcium: 9.1 mg/dL (ref 8.4–10.5)
Chloride: 112 mEq/L (ref 96–112)
Creatinine, Ser: 0.54 mg/dL (ref 0.4–1.5)
GFR calc Af Amer: >60 mL/min (ref >60)
GFR calc non Af Amer: >60 mL/min (ref >60)
Glucose, Bld: 99 mg/dL (ref 70–99)
Potassium: 4.2 mEq/L (ref 3.5–5.1)
Sodium: 141 mEq/L (ref 135–145)
Total Bilirubin: 3.2 mg/dL — ABNORMAL HIGH (ref 0.3–1.2)
Total Protein: 7.2 g/dL (ref 6.0–8.3)

## 2010-02-09 IMAGING — CT CT ANGIO CHEST
2 of 5 series · 19 of 36 positions shown · IV contrast (APPLIED)
Comparison: 03/08/07.

CLINICAL DATA: Right chest pain. 
 CT ANGIOGRAPHY OF CHEST:
TECHNIQUE: Multidetector CT imaging of the chest was performed during bolus injection of intravenous contrast.  Multiplanar CT angiographic image reconstructions were generated to evaluate the vascular anatomy.
 Contrast:  100 cc Omnipaque 300.

[Series 8: pulm embolism 1.0 b25f thins · axial · 0.66mm/px · z∈[-288,-28]mm · 16 of 291 slices shown]
[im 16/291  lung]
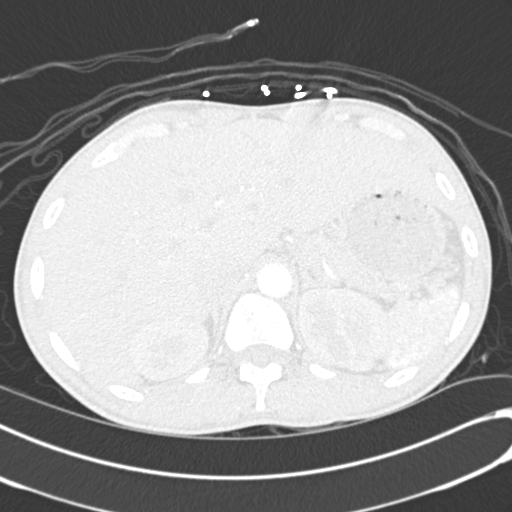
[im 31/291  mediastinal]
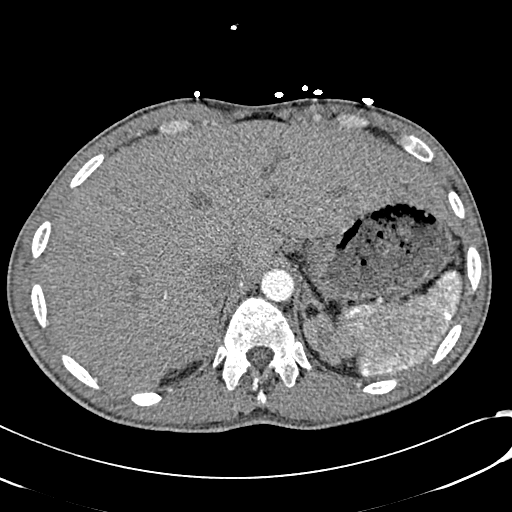
[im 46/291  lung]
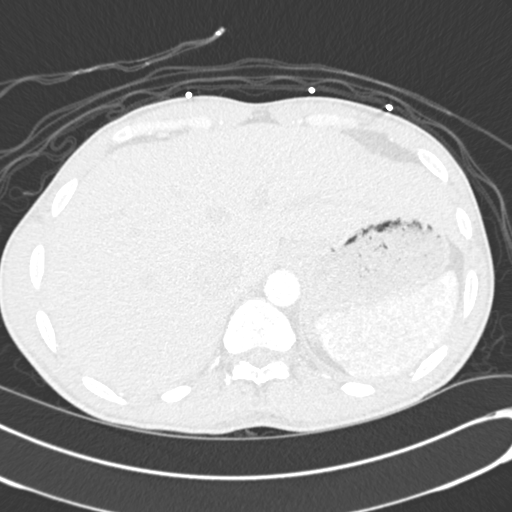
[im 62/291  mediastinal]
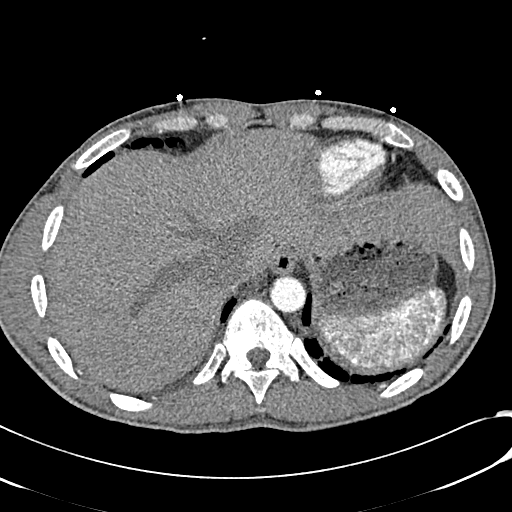
[im 92/291  lung]
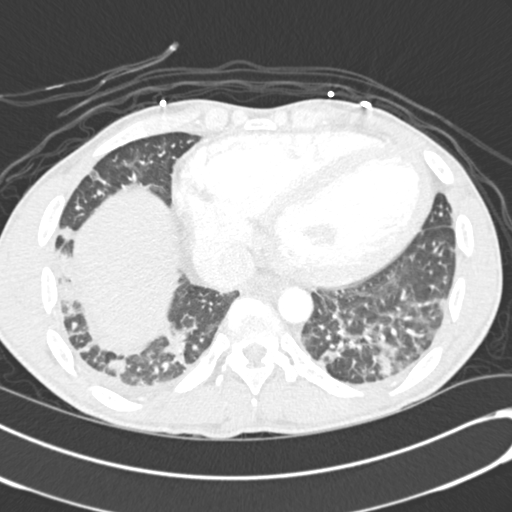
[im 107/291  mediastinal]
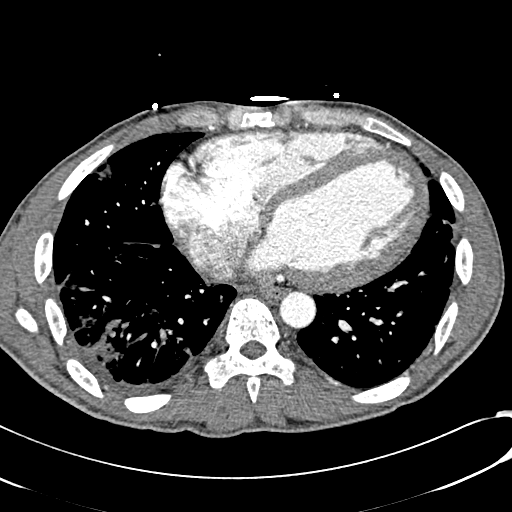
[im 123/291  lung]
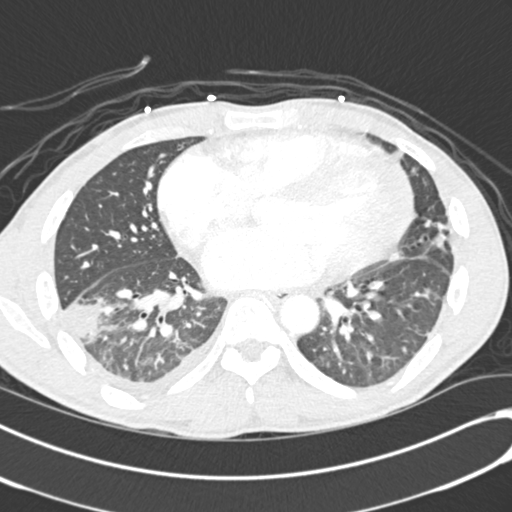
[im 138/291  mediastinal]
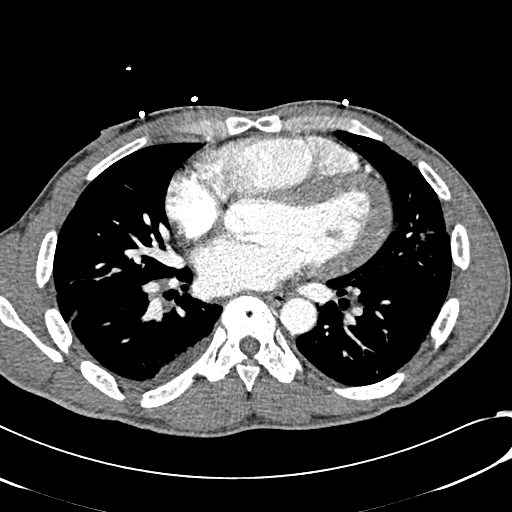
[im 153/291  lung]
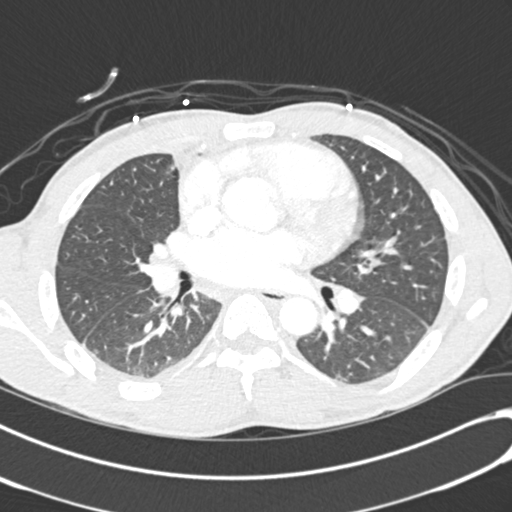
[im 168/291  mediastinal]
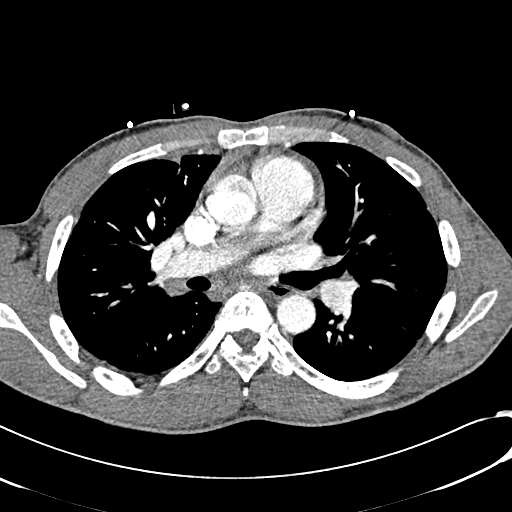
[im 184/291  lung]
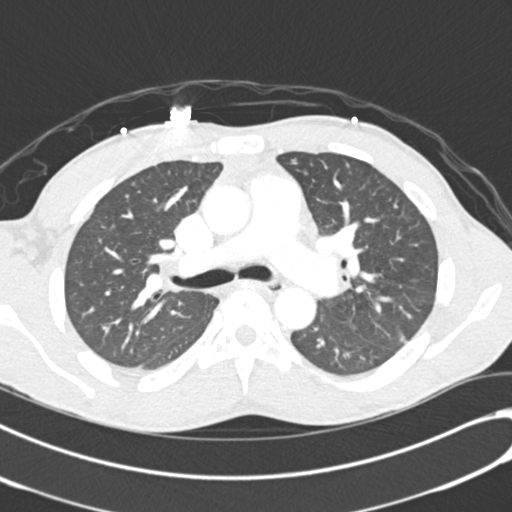
[im 199/291  mediastinal]
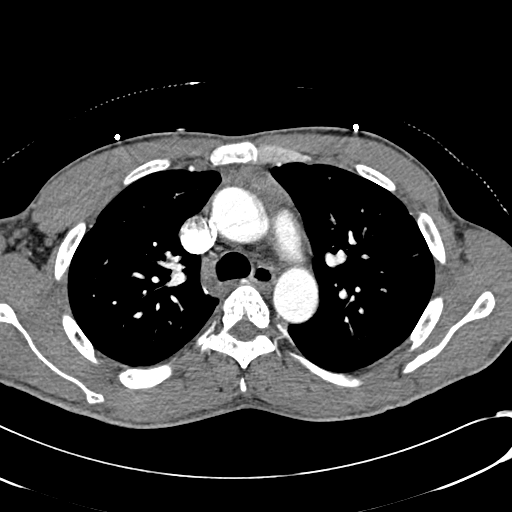
[im 229/291  lung]
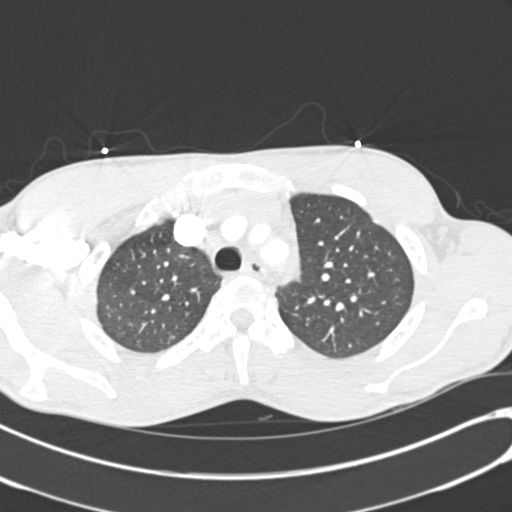
[im 245/291  mediastinal]
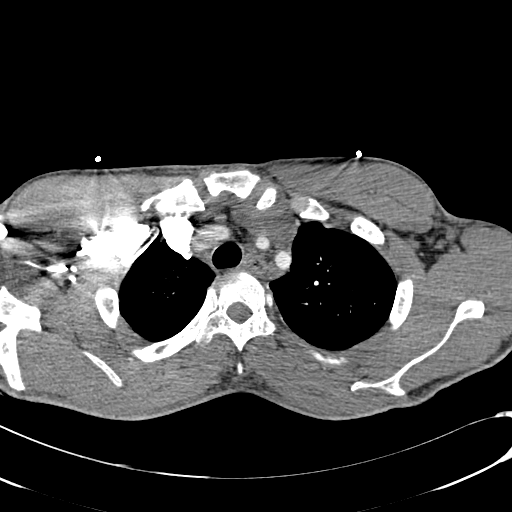
[im 260/291  lung]
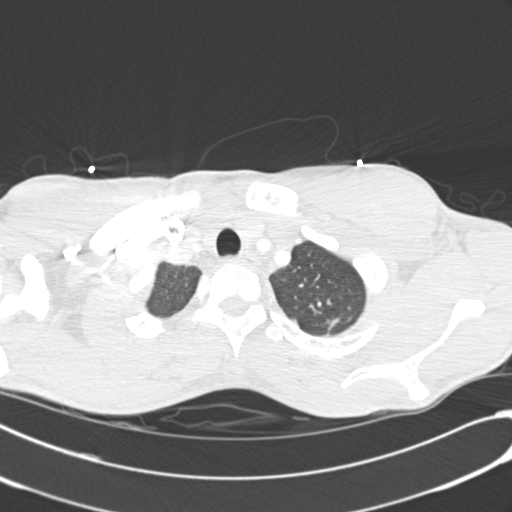
[im 275/291  mediastinal]
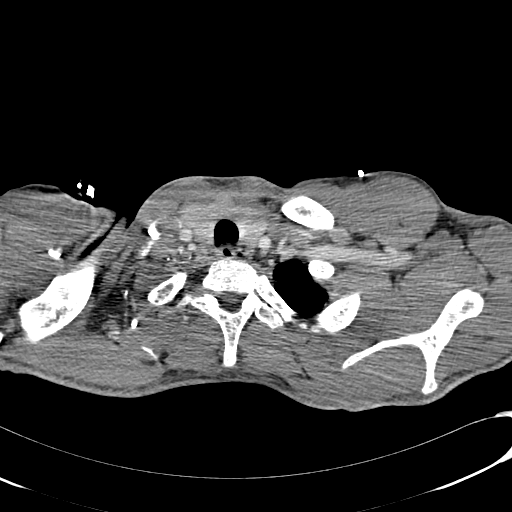

[Series 9: pulm embolism 2.0 spo cor thins · coronal · 0.66mm/px · 3 of 97 slices shown]
[im 20/97  mediastinal]
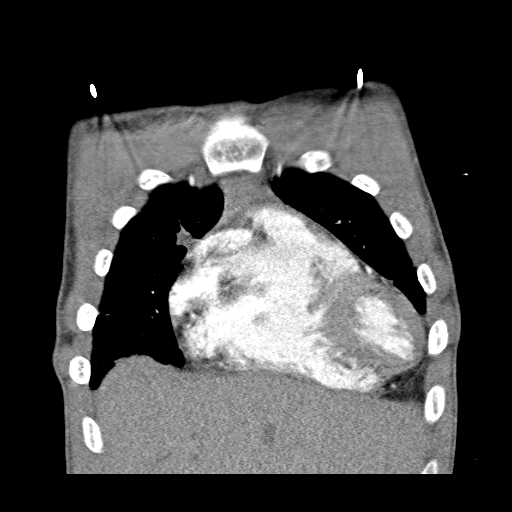
[im 39/97  mediastinal]
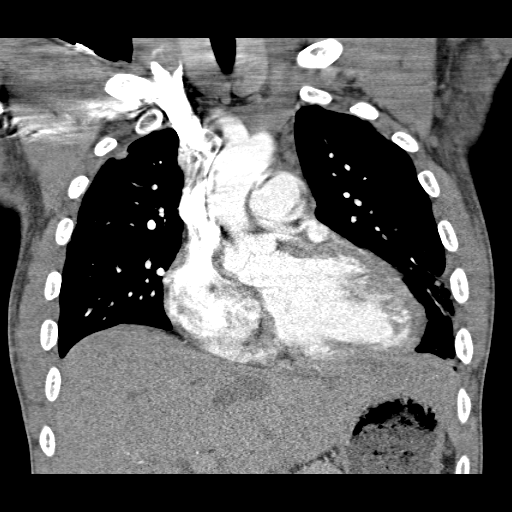
[im 58/97  mediastinal]
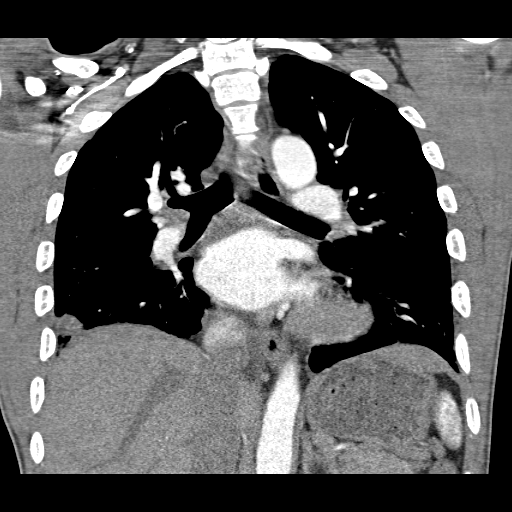

[19 of 36 positions shown; findings below may reference images not displayed]

FINDINGS: In 3 planes, there are no definite pulmonary emboli.  Small focus of airspace density in the right upper lobe stable to slightly less prominent consistent with a resolving infiltrate.  There is right lower lobe airspace disease which was present previously.  It has slightly improved.  There is some scar like density in the periphery of the left lower lobe that is unchanged.  There is a small right pleural effusion which is slightly larger.   
 The heart is enlarged as before.  The 2.0 x 1.6 cm right hilar node is stable.  There are no new enlarged lymph nodes.  There is no pericardial fluid.
IMPRESSION: 1.  No evidence for pulmonary emboli. 
 2.  Bilateral lung densities, some of which are scar.  Acute inflammatory densities were present on 03/08/07 and show some interval improvement.  No definite areas of new airspace disease. 
 3.  There is a small right pleural effusion which is slightly larger than before.

## 2010-02-09 IMAGING — CR DG CHEST 2V
2 series · 2 of 2 positions shown · non-contrast
Comparison: 03/08/07.

CLINICAL DATA: Right chest pain/history of sickle cell disease. 
 CHEST - 2 VIEW ? 03/19/07:

[w chest pa]
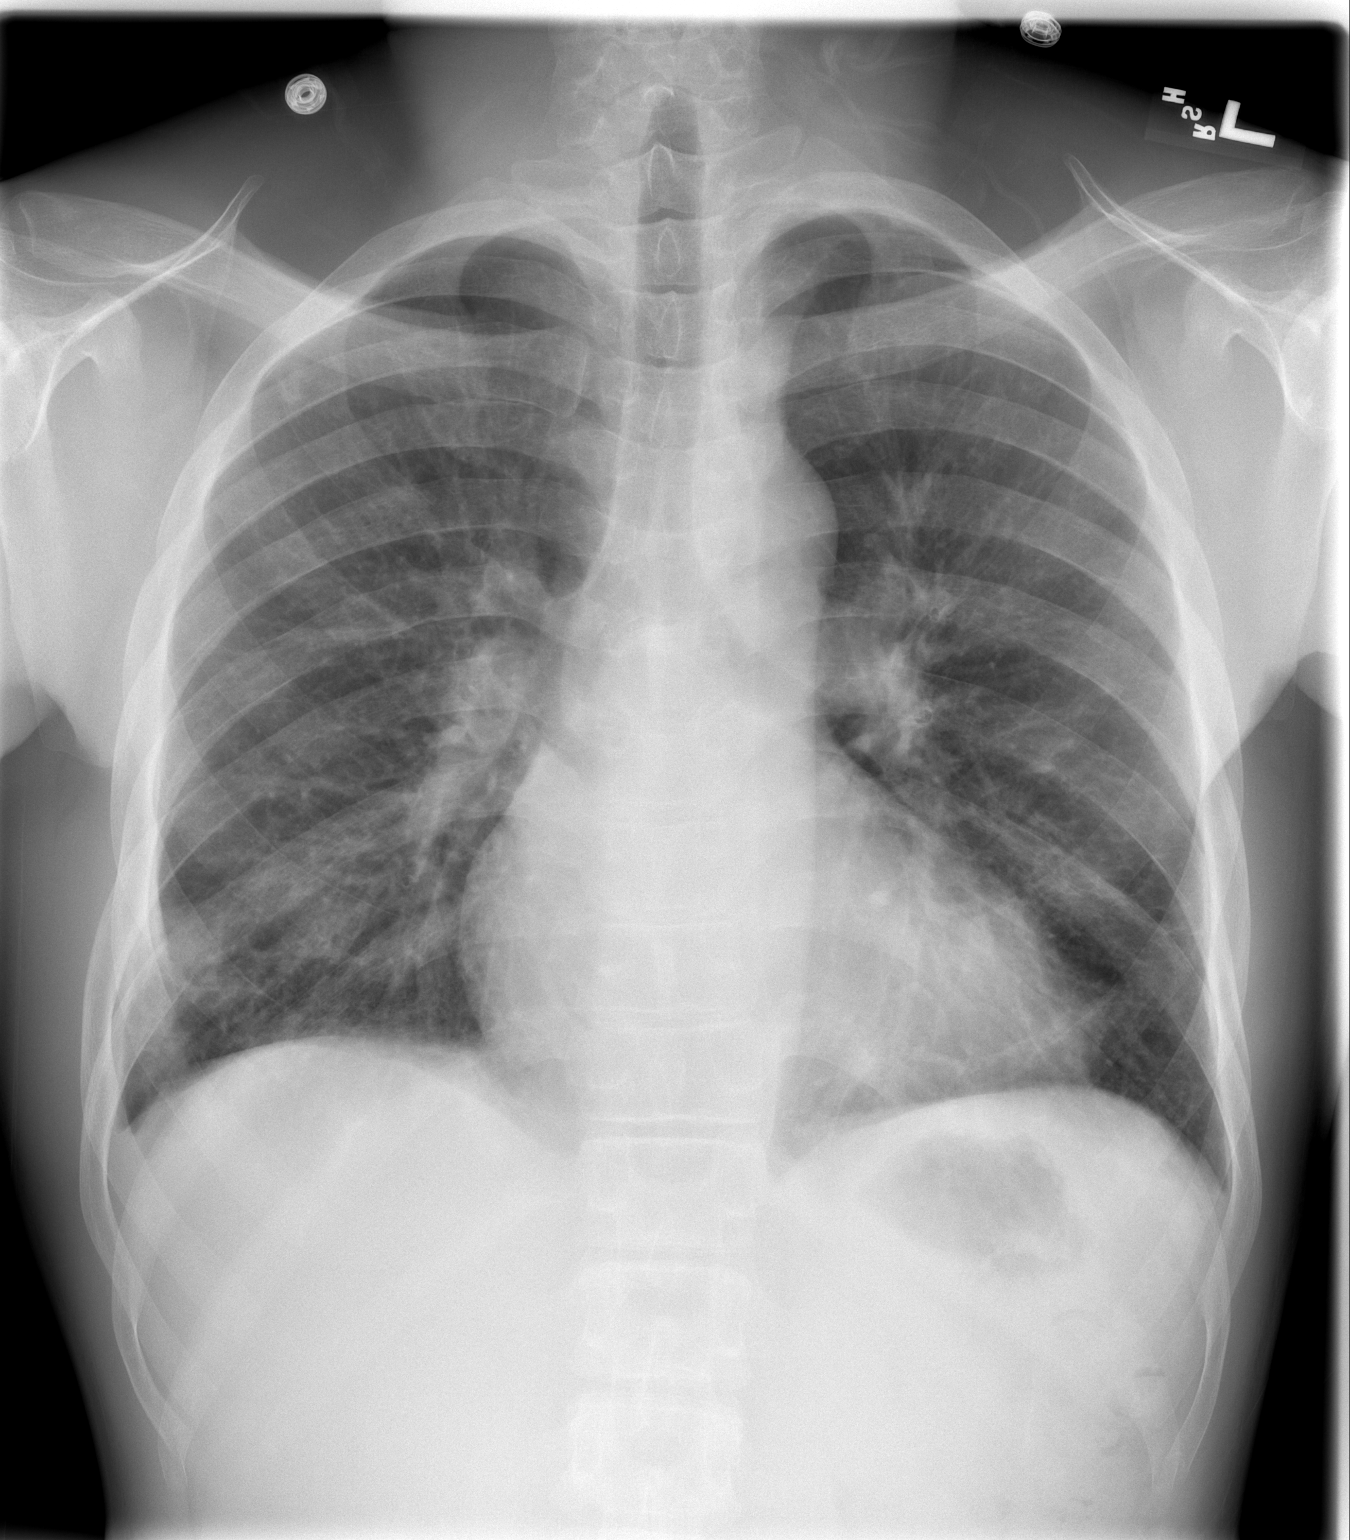

[w chest lat]
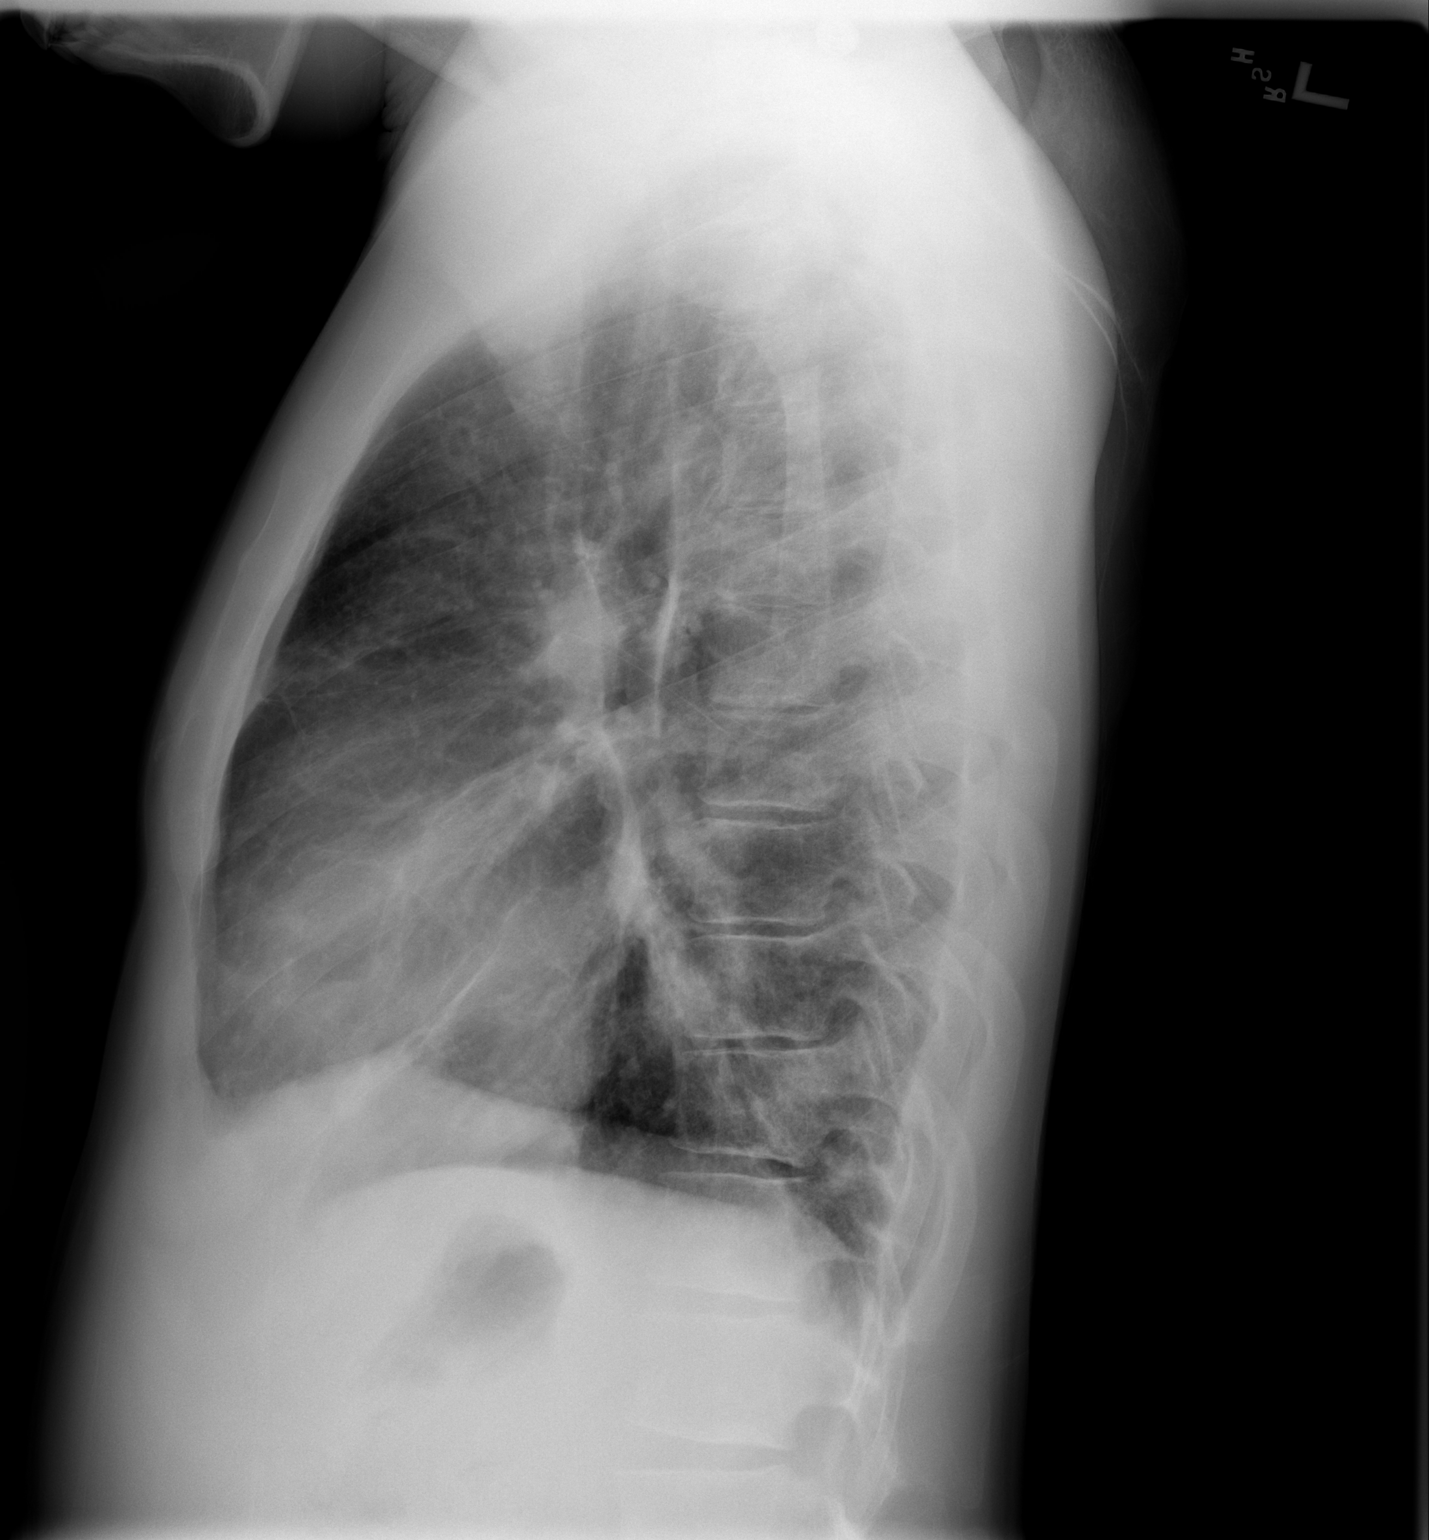

[2 of 2 positions shown; findings below may reference images not displayed]

FINDINGS: The heart is enlarged mildly without congestive heart failure.   Right lower lobe pneumonia has improved but not resolved completely.  No new findings.  No pleural fluid.
IMPRESSION: 1.  Cardiomegaly without failure. 
 2.  Right lower lobe pneumonia improved but not completely resolved.

## 2010-02-12 IMAGING — CR DG CHEST 2V
2 series · 2 of 2 positions shown · non-contrast
Comparison: none

CLINICAL DATA: Pneumonia

Chest 2 view:
Comparison 03/19/2007. Patchy airspace infiltrates in the right lower lobe and to
a lesser degree in the left lower lobe and lingula, without significant interval
change since previous study. No effusion. Mild cardiomegaly. Visualized bones
unremarkable.

[w chest pa]
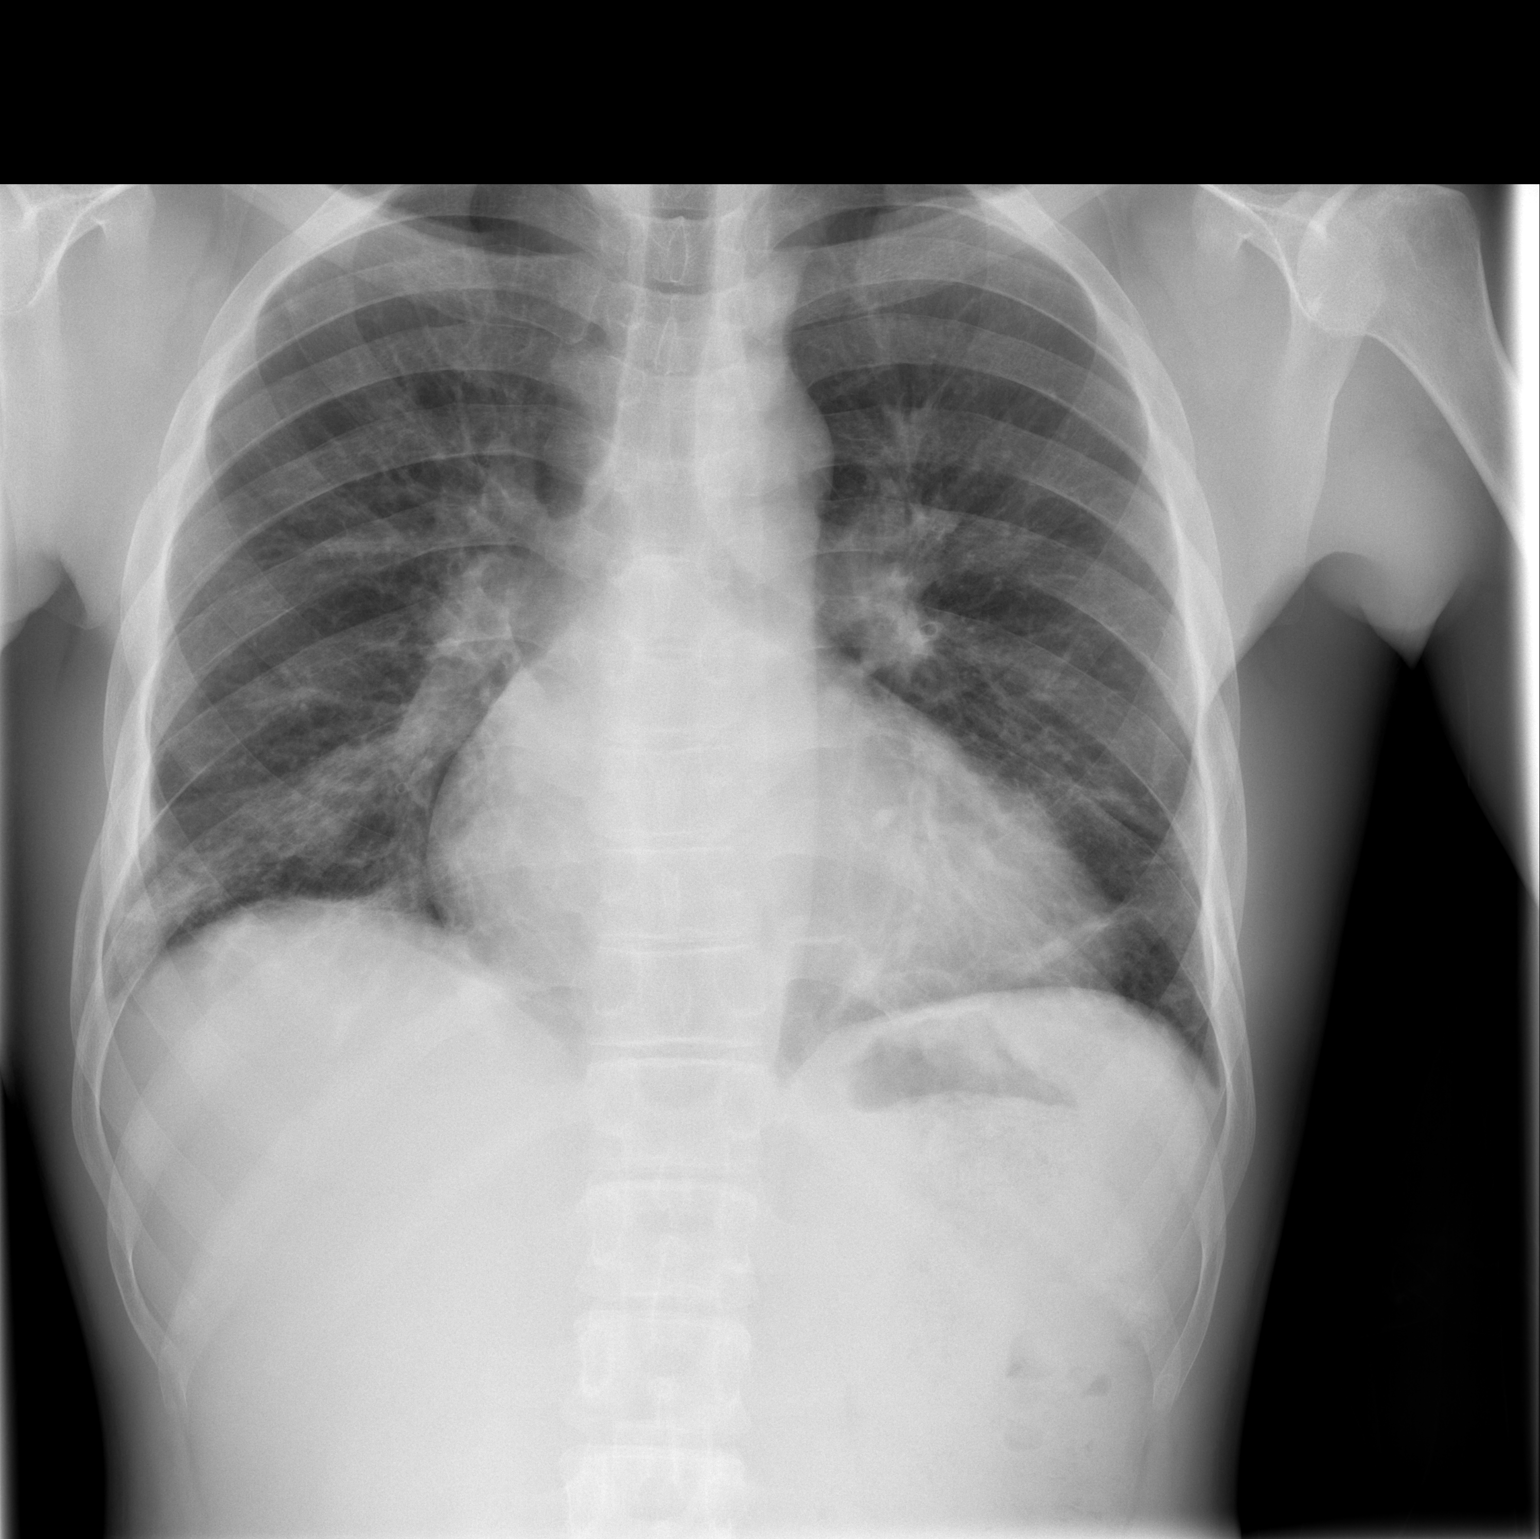

[w chest lat]
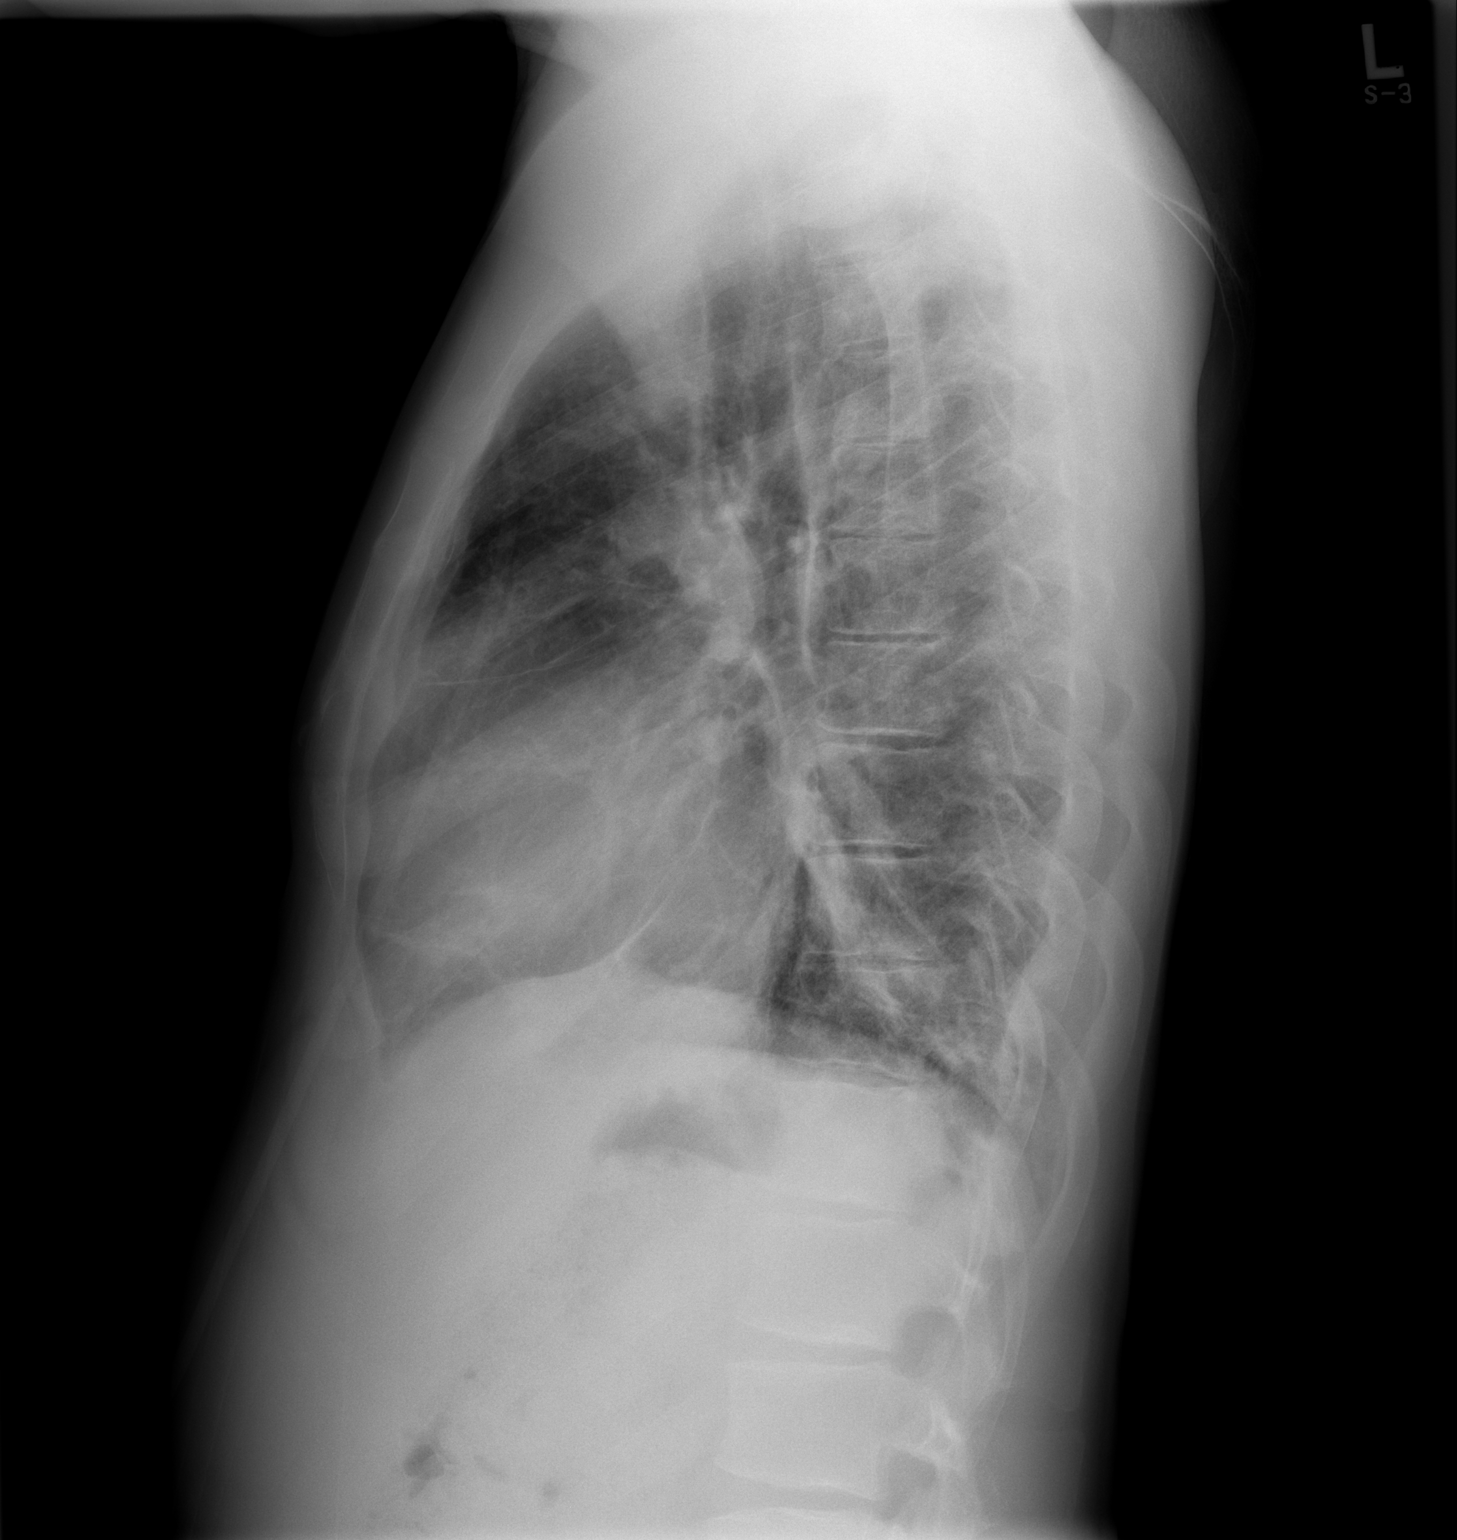

[2 of 2 positions shown; findings below may reference images not displayed]

IMPRESSION: 1. Stable bibasilar patchy air space infiltrates

## 2010-02-15 LAB — COMPREHENSIVE METABOLIC PANEL
ALT: 16 U/L (ref 0–53)
ALT: 25 U/L (ref 0–53)
AST: 34 U/L (ref 0–37)
AST: 41 U/L — ABNORMAL HIGH (ref 0–37)
Albumin: 4.1 g/dL (ref 3.5–5.2)
Albumin: 3.8 g/dL (ref 3.5–5.2)
Alkaline Phosphatase: 90 U/L (ref 39–117)
Alkaline Phosphatase: 83 U/L (ref 39–117)
BUN: 6 mg/dL (ref 6–23)
BUN: 7 mg/dL (ref 6–23)
CO2: 25 mEq/L (ref 19–32)
CO2: 23 mEq/L (ref 19–32)
Calcium: 9.3 mg/dL (ref 8.4–10.5)
Calcium: 9 mg/dL (ref 8.4–10.5)
Chloride: 108 mEq/L (ref 96–112)
Chloride: 112 mEq/L (ref 96–112)
Creatinine, Ser: 0.64 mg/dL (ref 0.4–1.5)
Creatinine, Ser: 0.57 mg/dL (ref 0.4–1.5)
GFR calc Af Amer: >60 mL/min (ref >60)
GFR calc Af Amer: >60 mL/min (ref >60)
GFR calc non Af Amer: >60 mL/min (ref >60)
GFR calc non Af Amer: >60 mL/min (ref >60)
Glucose, Bld: 97 mg/dL (ref 70–99)
Glucose, Bld: 104 mg/dL — ABNORMAL HIGH (ref 70–99)
Potassium: 4 mEq/L (ref 3.5–5.1)
Potassium: 4.1 mEq/L (ref 3.5–5.1)
Sodium: 138 mEq/L (ref 135–145)
Sodium: 141 mEq/L (ref 135–145)
Total Bilirubin: 3.3 mg/dL — ABNORMAL HIGH (ref 0.3–1.2)
Total Bilirubin: 2.9 mg/dL — ABNORMAL HIGH (ref 0.3–1.2)
Total Protein: 7.6 g/dL (ref 6.0–8.3)
Total Protein: 7 g/dL (ref 6.0–8.3)

## 2010-02-15 LAB — RETICULOCYTES
RBC.: 2.35 MIL/uL — ABNORMAL LOW (ref 4.22–5.81)
Retic Count, Absolute: 166.9 10*3/uL (ref 19.0–186.0)
Retic Ct Pct: 7.1 % — ABNORMAL HIGH (ref 0.4–3.1)

## 2010-02-15 LAB — TYPE AND SCREEN
ABO/RH(D): O POS
Antibody Screen: NEGATIVE
Unit division: 0

## 2010-02-15 LAB — CBC
HCT: 22 % — ABNORMAL LOW (ref 39.0–52.0)
HCT: 24.1 % — ABNORMAL LOW (ref 39.0–52.0)
Hemoglobin: 7.3 g/dL — ABNORMAL LOW (ref 13.0–17.0)
Hemoglobin: 7.9 g/dL — ABNORMAL LOW (ref 13.0–17.0)
MCH: 31.1 pg (ref 26.0–34.0)
MCH: 31 pg (ref 26.0–34.0)
MCHC: 33.2 g/dL (ref 30.0–36.0)
MCHC: 32.8 g/dL (ref 30.0–36.0)
MCV: 93.6 fL (ref 78.0–100.0)
MCV: 94.5 fL (ref 78.0–100.0)
Platelets: ADEQUATE 10*3/uL (ref 150–400)
Platelets: 490 10*3/uL — ABNORMAL HIGH (ref 150–400)
RBC: 2.35 MIL/uL — ABNORMAL LOW (ref 4.22–5.81)
RBC: 2.55 MIL/uL — ABNORMAL LOW (ref 4.22–5.81)
RDW: 17.8 % — ABNORMAL HIGH (ref 11.5–15.5)
RDW: 17.1 % — ABNORMAL HIGH (ref 11.5–15.5)
WBC: 14.8 10*3/uL — ABNORMAL HIGH (ref 4.0–10.5)
WBC: 13.8 10*3/uL — ABNORMAL HIGH (ref 4.0–10.5)

## 2010-02-15 LAB — PREPARE RBC (CROSSMATCH)

## 2010-02-21 ENCOUNTER — Encounter: Payer: Self-pay | Admitting: Orthopaedic Surgery

## 2010-02-22 ENCOUNTER — Emergency Department (HOSPITAL_COMMUNITY)
Admission: EM | Admit: 2010-02-22 | Discharge: 2010-02-22 | Payer: Self-pay | Source: Home / Self Care | Admitting: Emergency Medicine

## 2010-02-23 LAB — CBC
MCV: 94 fL (ref 78.0–100.0)
Platelets: 639 10*3/uL — ABNORMAL HIGH (ref 150–400)
RDW: 17.4 % — ABNORMAL HIGH (ref 11.5–15.5)
WBC: 24.2 10*3/uL — ABNORMAL HIGH (ref 4.0–10.5)

## 2010-02-23 LAB — DIFFERENTIAL
Basophils Absolute: 0.2 10*3/uL — ABNORMAL HIGH (ref 0.0–0.1)
Eosinophils Absolute: 2.4 10*3/uL — ABNORMAL HIGH (ref 0.0–0.7)
Eosinophils Relative: 10 % — ABNORMAL HIGH (ref 0–5)
Lymphs Abs: 3.6 10*3/uL (ref 0.7–4.0)
Monocytes Absolute: 2.2 10*3/uL — ABNORMAL HIGH (ref 0.1–1.0)

## 2010-02-23 LAB — BASIC METABOLIC PANEL
Calcium: 9 mg/dL (ref 8.4–10.5)
Creatinine, Ser: 0.59 mg/dL (ref 0.4–1.5)
GFR calc Af Amer: >60 mL/min (ref >60)
GFR calc non Af Amer: >60 mL/min (ref >60)

## 2010-02-23 LAB — RETICULOCYTES: Retic Count, Absolute: 290.7 10*3/uL — ABNORMAL HIGH (ref 19.0–186.0)

## 2010-02-27 ENCOUNTER — Emergency Department (HOSPITAL_COMMUNITY)
Admission: EM | Admit: 2010-02-27 | Discharge: 2010-02-27 | Payer: Self-pay | Source: Home / Self Care | Admitting: Emergency Medicine

## 2010-02-27 LAB — URINALYSIS, ROUTINE W REFLEX MICROSCOPIC
Bilirubin Urine: NEGATIVE
Ketones, ur: NEGATIVE mg/dL
Nitrite: NEGATIVE
pH: 6 (ref 5.0–8.0)

## 2010-02-27 LAB — CBC
HCT: 22.4 % — ABNORMAL LOW (ref 39.0–52.0)
Hemoglobin: 7.6 g/dL — ABNORMAL LOW (ref 13.0–17.0)
MCH: 31.4 pg (ref 26.0–34.0)
MCHC: 33.9 g/dL (ref 30.0–36.0)
MCV: 92.6 fL (ref 78.0–100.0)
RDW: 16.5 % — ABNORMAL HIGH (ref 11.5–15.5)

## 2010-02-27 LAB — URINE MICROSCOPIC-ADD ON

## 2010-02-27 LAB — DIFFERENTIAL
Eosinophils Relative: 12 % — ABNORMAL HIGH (ref 0–5)
Lymphs Abs: 4.4 10*3/uL — ABNORMAL HIGH (ref 0.7–4.0)
Neutro Abs: 12.2 10*3/uL — ABNORMAL HIGH (ref 1.7–7.7)
Neutrophils Relative %: 55 % (ref 43–77)

## 2010-02-27 LAB — RETICULOCYTES: RBC.: 2.42 MIL/uL — ABNORMAL LOW (ref 4.22–5.81)

## 2010-03-01 LAB — GC/CHLAMYDIA PROBE AMP, GENITAL: Chlamydia, DNA Probe: NEGATIVE

## 2010-03-04 ENCOUNTER — Emergency Department (HOSPITAL_COMMUNITY)
Admission: EM | Admit: 2010-03-04 | Discharge: 2010-03-04 | Disposition: A | Discharge status: Home / Self Care | Payer: Medicare Other | Attending: Emergency Medicine | Admitting: Emergency Medicine

## 2010-03-04 DIAGNOSIS — R079 Chest pain, unspecified: Secondary | ICD-10-CM | POA: Insufficient documentation

## 2010-03-04 DIAGNOSIS — M25519 Pain in unspecified shoulder: Secondary | ICD-10-CM | POA: Insufficient documentation

## 2010-03-04 DIAGNOSIS — D5700 Hb-SS disease with crisis, unspecified: Secondary | ICD-10-CM | POA: Insufficient documentation

## 2010-03-04 LAB — CBC
HCT: 22.4 % — ABNORMAL LOW (ref 39.0–52.0)
Hemoglobin: 7.5 g/dL — ABNORMAL LOW (ref 13.0–17.0)
MCH: 30.9 pg (ref 26.0–34.0)
MCHC: 33.5 g/dL (ref 30.0–36.0)
MCV: 92.2 fL (ref 78.0–100.0)
Platelets: 673 10*3/uL — ABNORMAL HIGH (ref 150–400)
RBC: 2.43 MIL/uL — ABNORMAL LOW (ref 4.22–5.81)
RDW: 17.7 % — ABNORMAL HIGH (ref 11.5–15.5)
WBC: 18.6 10*3/uL — ABNORMAL HIGH (ref 4.0–10.5)

## 2010-03-05 ENCOUNTER — Inpatient Hospital Stay (HOSPITAL_COMMUNITY)
Admission: EM | Admit: 2010-03-05 | Discharge: 2010-03-11 | DRG: 812 | Disposition: A | Discharge status: Home / Self Care | Payer: Medicare Other | Attending: Internal Medicine | Admitting: Internal Medicine

## 2010-03-05 ENCOUNTER — Emergency Department (HOSPITAL_COMMUNITY): Payer: Medicare Other

## 2010-03-05 DIAGNOSIS — E876 Hypokalemia: Secondary | ICD-10-CM | POA: Diagnosis present

## 2010-03-05 DIAGNOSIS — M87029 Idiopathic aseptic necrosis of unspecified humerus: Secondary | ICD-10-CM | POA: Diagnosis present

## 2010-03-05 DIAGNOSIS — Z96649 Presence of unspecified artificial hip joint: Secondary | ICD-10-CM

## 2010-03-05 DIAGNOSIS — D599 Acquired hemolytic anemia, unspecified: Secondary | ICD-10-CM | POA: Diagnosis present

## 2010-03-05 DIAGNOSIS — Z86711 Personal history of pulmonary embolism: Secondary | ICD-10-CM

## 2010-03-05 DIAGNOSIS — D5700 Hb-SS disease with crisis, unspecified: Principal | ICD-10-CM | POA: Diagnosis present

## 2010-03-05 DIAGNOSIS — D72829 Elevated white blood cell count, unspecified: Secondary | ICD-10-CM | POA: Diagnosis present

## 2010-03-05 LAB — DIFFERENTIAL
Basophils Absolute: 0.3 10*3/uL — ABNORMAL HIGH (ref 0.0–0.1)
Eosinophils Relative: 11 % — ABNORMAL HIGH (ref 0–5)
Lymphs Abs: 5.3 10*3/uL — ABNORMAL HIGH (ref 0.7–4.0)
Monocytes Relative: 10 % (ref 3–12)
Neutrophils Relative %: 59 % (ref 43–77)

## 2010-03-05 LAB — URINALYSIS, ROUTINE W REFLEX MICROSCOPIC
Bilirubin Urine: NEGATIVE
Ketones, ur: NEGATIVE mg/dL
Nitrite: NEGATIVE
Urobilinogen, UA: 1 mg/dL (ref 0.0–1.0)

## 2010-03-05 LAB — RETICULOCYTES
RBC.: 2.17 MIL/uL — ABNORMAL LOW (ref 4.22–5.81)
Retic Count, Absolute: 308.1 10*3/uL — ABNORMAL HIGH (ref 19.0–186.0)
Retic Ct Pct: 14.2 % — ABNORMAL HIGH (ref 0.4–3.1)

## 2010-03-05 LAB — POCT I-STAT, CHEM 8
Calcium, Ion: 1.27 mmol/L (ref 1.12–1.32)
Chloride: 107 mEq/L (ref 96–112)
Glucose, Bld: 97 mg/dL (ref 70–99)
HCT: 23 % — ABNORMAL LOW (ref 39.0–52.0)
Hemoglobin: 7.8 g/dL — ABNORMAL LOW (ref 13.0–17.0)
Potassium: 3.5 mEq/L (ref 3.5–5.1)

## 2010-03-05 LAB — CBC
HCT: 20 % — ABNORMAL LOW (ref 39.0–52.0)
Hemoglobin: 6.8 g/dL — CL (ref 13.0–17.0)
MCHC: 34 g/dL (ref 30.0–36.0)
RDW: 18.3 % — ABNORMAL HIGH (ref 11.5–15.5)
WBC: 28.1 10*3/uL — ABNORMAL HIGH (ref 4.0–10.5)

## 2010-03-06 LAB — CBC
HCT: 18.9 % — ABNORMAL LOW (ref 39.0–52.0)
Hemoglobin: 6.3 g/dL — CL (ref 13.0–17.0)
MCH: 31.2 pg (ref 26.0–34.0)
MCHC: 33.3 g/dL (ref 30.0–36.0)
MCV: 93.6 fL (ref 78.0–100.0)
Platelets: 510 10*3/uL — ABNORMAL HIGH (ref 150–400)
RBC: 2.02 MIL/uL — ABNORMAL LOW (ref 4.22–5.81)
RDW: 18.2 % — ABNORMAL HIGH (ref 11.5–15.5)
WBC: 26 10*3/uL — ABNORMAL HIGH (ref 4.0–10.5)

## 2010-03-06 LAB — COMPREHENSIVE METABOLIC PANEL WITH GFR
ALT: 10 U/L (ref 0–53)
AST: 22 U/L (ref 0–37)
Albumin: 3.4 g/dL — ABNORMAL LOW (ref 3.5–5.2)
Alkaline Phosphatase: 78 U/L (ref 39–117)
BUN: 4 mg/dL — ABNORMAL LOW (ref 6–23)
CO2: 22 meq/L (ref 19–32)
Calcium: 8.6 mg/dL (ref 8.4–10.5)
Chloride: 112 meq/L (ref 96–112)
Creatinine, Ser: 0.63 mg/dL (ref 0.4–1.5)
GFR calc non Af Amer: >60 mL/min
Glucose, Bld: 101 mg/dL — ABNORMAL HIGH (ref 70–99)
Potassium: 3.3 meq/L — ABNORMAL LOW (ref 3.5–5.1)
Sodium: 141 meq/L (ref 135–145)
Total Bilirubin: 2.2 mg/dL — ABNORMAL HIGH (ref 0.3–1.2)
Total Protein: 6.3 g/dL (ref 6.0–8.3)

## 2010-03-07 LAB — BASIC METABOLIC PANEL
BUN: 5 mg/dL — ABNORMAL LOW (ref 6–23)
Chloride: 112 mEq/L (ref 96–112)
Glucose, Bld: 98 mg/dL (ref 70–99)
Potassium: 3.8 mEq/L (ref 3.5–5.1)

## 2010-03-07 LAB — URINE CULTURE
Colony Count: NO GROWTH
Culture: NO GROWTH

## 2010-03-07 LAB — CBC
HCT: 22.9 % — ABNORMAL LOW (ref 39.0–52.0)
MCV: 92.7 fL (ref 78.0–100.0)
RBC: 2.47 MIL/uL — ABNORMAL LOW (ref 4.22–5.81)
RDW: 18.7 % — ABNORMAL HIGH (ref 11.5–15.5)
WBC: 22.8 10*3/uL — ABNORMAL HIGH (ref 4.0–10.5)

## 2010-03-08 ENCOUNTER — Inpatient Hospital Stay (HOSPITAL_COMMUNITY): Payer: Medicare Other

## 2010-03-08 LAB — CBC
Platelets: 410 10*3/uL — ABNORMAL HIGH (ref 150–400)
RBC: 2.54 MIL/uL — ABNORMAL LOW (ref 4.22–5.81)
WBC: 22.8 10*3/uL — ABNORMAL HIGH (ref 4.0–10.5)

## 2010-03-08 LAB — MRSA PCR SCREENING: MRSA by PCR: NEGATIVE

## 2010-03-08 LAB — RETICULOCYTES
Retic Count, Absolute: 340.4 10*3/uL — ABNORMAL HIGH (ref 19.0–186.0)
Retic Ct Pct: 13.4 % — ABNORMAL HIGH (ref 0.4–3.1)

## 2010-03-10 LAB — CROSSMATCH: ABO/RH(D): O POS

## 2010-03-10 LAB — CBC
MCH: 30.9 pg (ref 26.0–34.0)
MCV: 92.4 fL (ref 78.0–100.0)
Platelets: 503 10*3/uL — ABNORMAL HIGH (ref 150–400)
RDW: 17.6 % — ABNORMAL HIGH (ref 11.5–15.5)

## 2010-03-10 LAB — BASIC METABOLIC PANEL
BUN: 11 mg/dL (ref 6–23)
Chloride: 105 mEq/L (ref 96–112)
Creatinine, Ser: 0.78 mg/dL (ref 0.4–1.5)
GFR calc non Af Amer: >60 mL/min (ref >60)

## 2010-03-11 LAB — CBC
MCV: 91.4 fL (ref 78.0–100.0)
Platelets: 493 10*3/uL — ABNORMAL HIGH (ref 150–400)
RDW: 17.3 % — ABNORMAL HIGH (ref 11.5–15.5)
WBC: 19 10*3/uL — ABNORMAL HIGH (ref 4.0–10.5)

## 2010-03-14 NOTE — Consult Note (Signed)
[name]             ACCOUNT NO.:  [id number]  MEDICAL RECORD NO.:  [id number]           PATIENT TYPE:  I  LOCATION:  1304                         FACILITY:  [facility]  PHYSICIAN:  Burnard Bunting, M.D.    DATE OF BIRTH:  19-Aug-1980  DATE OF CONSULTATION:  03/09/2010 DATE OF DISCHARGE:                                CONSULTATION   REQUESTING PHYSICIAN:  [name], M.D.  CHIEF COMPLAINT:  Left shoulder pain.  HISTORY OF PRESENT ILLNESS:  [name] is a 30 year old patient admitted on March 05, 2010, with sickle crisis.  He has been admitted and treated with IV hydration and pain medicine.  He reports gradual recurrence of left shoulder pain.  His old records are reviewed. He does have a known history of left shoulder AVN.  I consulted on him in May 2011.  MRI scan was consistent with AVN without humeral head collapse.  Subacromial injection was performed, which actually helped his symptoms significantly.  He denies any fever and chills and he does report functional range of motion of the shoulder without mechanical symptoms but does localize pain in the deltoid region.  PAST MEDICAL HISTORY: 1. Notable for sickle cell anemia. 2. Pulmonary embolus. 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 5. History of chronic leukocytosis and thrombocytosis. 6. Cholecystectomy. 7. Umbilical hernia repair. 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  SOCIAL HISTORY:  The patient is a social drinker, quit smoking in February 2011.  The patient does live in [place].  FAMILY HISTORY:  Mother has diabetes.  Father died at age 66 of gunshot wound.  Has 3 brothers, one of whom has sickle cell trait.  ALLERGIES:  He is allergic to MORPHINE.  CURRENT MEDICATIONS: 1. OxyContin. 2. Ambien. 3. Potassium chloride. 4. Ibuprofen. 5. Hydroxyurea. 6. Dilaudid. 7. Folic acid.  REVIEW OF SYSTEMS:  All other systems reviewed are negative  except for the left shoulder.  PHYSICAL EXAMINATION:  GENERAL:  He is well developed, well nourished, and in no acute distress; alert and oriented. VITAL SIGNS:  Blood pressure 113/62, pulse 77, respiratory rate 20, and temperature 98.9. MUSCULOSKELETAL:  He does have pretty good active and passive range of motion of the shoulder on the left shoulder, positive impingement sign. Motor and sensory function in the left hand is intact.  There is no real effusion in the left shoulder region.  IMAGING STUDIES:  New radiographs were reviewed.  Two views of left shoulder do show AVN, but maintenance of shoulder morphology.  LABORATORY VALUES:  Include hemoglobin 8.0, platelets 410,000, and BUN and creatinine are 5 and 0.59.  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  PLAN:  I think we will try subacromial injection today or tomorrow, that has helped him in the past.  I think it is a good option to try to get some pain relief without having to undergo any kind of large procedure. All questions answered.  We will do that once the materials have been gathered.     G.  Dorene Grebe, M.D.     GSD/MEDQ  D:  03/09/2010  T:  03/09/2010  Job:  [id number]  Electronically Signed by Reece Agar.  DEAN M.D. on 03/11/2010 12:03:31 PM

## 2010-03-18 ENCOUNTER — Emergency Department (HOSPITAL_COMMUNITY)
Admission: EM | Admit: 2010-03-18 | Discharge: 2010-03-18 | Disposition: A | Discharge status: Home / Self Care | Payer: Medicare Other | Attending: Emergency Medicine | Admitting: Emergency Medicine

## 2010-03-18 DIAGNOSIS — Z86711 Personal history of pulmonary embolism: Secondary | ICD-10-CM | POA: Insufficient documentation

## 2010-03-18 DIAGNOSIS — R079 Chest pain, unspecified: Secondary | ICD-10-CM | POA: Insufficient documentation

## 2010-03-18 DIAGNOSIS — D5700 Hb-SS disease with crisis, unspecified: Secondary | ICD-10-CM | POA: Insufficient documentation

## 2010-03-18 DIAGNOSIS — M8700 Idiopathic aseptic necrosis of unspecified bone: Secondary | ICD-10-CM | POA: Insufficient documentation

## 2010-03-18 LAB — DIFFERENTIAL
Basophils Relative: 1 % (ref 0–1)
Eosinophils Relative: 2 % (ref 0–5)
Lymphocytes Relative: 11 % — ABNORMAL LOW (ref 12–46)
Monocytes Relative: 7 % (ref 3–12)
Neutro Abs: 14.7 10*3/uL — ABNORMAL HIGH (ref 1.7–7.7)

## 2010-03-18 LAB — CBC
HCT: 25 % — ABNORMAL LOW (ref 39.0–52.0)
Hemoglobin: 8.3 g/dL — ABNORMAL LOW (ref 13.0–17.0)
MCH: 32.2 pg (ref 26.0–34.0)
MCHC: 33.2 g/dL (ref 30.0–36.0)
MCV: 96.9 fL (ref 78.0–100.0)
Platelets: 692 10*3/uL — ABNORMAL HIGH (ref 150–400)
RBC: 2.58 MIL/uL — ABNORMAL LOW (ref 4.22–5.81)
RDW: 18 % — ABNORMAL HIGH (ref 11.5–15.5)
WBC: 18.6 10*3/uL — ABNORMAL HIGH (ref 4.0–10.5)

## 2010-03-18 LAB — POCT I-STAT, CHEM 8
BUN: <3 mg/dL — ABNORMAL LOW (ref 6–23)
Chloride: 108 mEq/L (ref 96–112)
HCT: 29 % — ABNORMAL LOW (ref 39.0–52.0)
Sodium: 141 mEq/L (ref 135–145)

## 2010-03-18 LAB — RETICULOCYTES
RBC.: 2.58 MIL/uL — ABNORMAL LOW (ref 4.22–5.81)
Retic Count, Absolute: 291.5 10*3/uL — ABNORMAL HIGH (ref 19.0–186.0)

## 2010-03-21 NOTE — Discharge Summary (Signed)
[name], SCHUBRING             ACCOUNT NO.:  [id number]  MEDICAL RECORD NO.:  [id number]           PATIENT TYPE:  I  LOCATION:  1304                         FACILITY:  [facility]  PHYSICIAN:  Marcellus Scott, MD     DATE OF BIRTH:  12-02-1980  DATE OF ADMISSION:  03/05/2010 DATE OF DISCHARGE:  03/11/2010                              DISCHARGE SUMMARY   PRIMARY CARE PHYSICIAN:  Lorelle Formosa, M.D.  DISCHARGE DIAGNOSES: 1. Sickle cell anemia with vasoocclusive crisis. 2. Left humeral avascular necrosis, status post local steroid     injection. 3. History of pulmonary embolism. 4. Chronic leukocytosis. 5. Chronic thrombocytosis. 6. Hyperbilirubinemia secondary to hemolytic anemia. 7. Hypokalemia, repleted.  DISCHARGE MEDICATIONS: 1. Ambien 10 mg p.o. q.h.s. p.r.n. for sleep. 2. Benadryl 50 mg p.o. daily p.r.n. for itching. 3. Folic acid 1 mg p.o. b.i.d. 4. Hydromorphone 4 mg p.o. q.4h. p.r.n. for pain. 5. Hydroxyurea 500 mg p.o. b.i.d. 6. Ibuprofen 800 mg p.o. q.8h. p.r.n. for pain. 7. Klor-Con 20 mEq p.o. daily. 8. OxyContin 40 mg p.o. b.i.d..  PROCEDURES:  A left shoulder intra-articular steroid injection by orthopedic MD on March 10, 2010.  IMAGING: 1. Portable left shoulder x-ray February 7.  Impression, diffuse     osteosclerosis of sickle cell disease with stable appearance of     avascular necrosis changes at the left humeral head.  No acute     abnormalities. 2. Chest x-ray on February 3.  Impression, chronic interstitial     changes without evidence for superimposed pneumonia.  Mild cardiac     enlargement.  PERTINENT LABORATORY DATA:  CBC today, hemoglobin 7.8, hematocrit 22, white blood cell 19,000 and platelet count 493,000.  Basic metabolic panel unremarkable except for glucose of 107.  BUN 11, creatinine 0.78. MRSA, PCR screening negative.  Absolute reticulocyte counts were 340,000.  Urine culture was negative.  Hepatic panel on February 4 was only  remarkable for total bilirubin of 2.2 and albumin of 3.4.  His admitting hemoglobin was 6.8.  CONSULTATIONS:  Orthopedics, Dr. Cammy Copa.  HISTORY:  Complains today decreasing pain in the left shoulder which is down from 6/10 to 4/10.  Pain is most when he abducts the left shoulder. Otherwise denies pain elsewhere and no other complaints.  He has had a bowel movement and is ambulant to the bathroom.  He is tolerating his diet.  PHYSICAL EXAMINATION:  GENERAL:  The patient is in no obvious distress. VITAL SIGNS:  Temperature 98.2 degrees Fahrenheit, pulse 68 per minute and regular, respirations 18 per minute, blood pressure 120/75 mmHg and saturating at 98% on room air. RESPIRATORY:  Respiratory system clear CARDIOVASCULAR:  First and second heart sounds heard, regular.  No JVD. ABDOMEN:  Soft and bowel sounds present.  Nondistended. CENTRAL NERVOUS SYSTEM:  Patient is awake, alert, oriented x 3 with no focal neurological deficits. EXTREMITIES:  Left shoulder, no swelling or warmth but is mild painful abduction.  Otherwise grade 5/5 power in all extremities.  DIET:  Regular diet.  ACTIVITY:  Ad lib.  HOSPITAL COURSE:  [name] is a 30 year old African-American male patient with history  of sickle cell anemia, who presented with pain in his legs, ribs, flanks and chest, suggestive of sickle cell vasoocclusive crisis.  He was referred to the hospitalist service for admission.  Please refer to the history and physical note by Dr. Pearson Grippe for admission details. 1. Sickle cell anemia with vasoocclusive crisis.  He was admitted and     placed on IV fluids, IV pain medications, folic acid and Hydrea.     He was also provided with heating pad to his left shoulder.  He was     transfused a unit of packed red blood cell.  With these measures,     his sickle cell vasoocclusive crisis pain has resolved. 2. Avascular necrosis of the left shoulder.  The patient had severe      pain in his left shoulder.  He has had steroid injections in the     past, which has resolved his pain.  Thereby, orthopedics MD was     consulted and consultation was kindly provided by Dr. [name].  He     indicated that the patient is not a great operative candidate and     the patient does have a history of total hip replacement.  He     provided a subacromial injection of steroid.  With this measure,     his pain seems to have been decreasing and the patient indicates     that he can manage this degree of pain at home.  We will check with     orthopedics if it is okay for him to be discharged and if he needs     any followup appointments with them. 3. Chronic leukocytosis and thrombocytosis.  The patient's counts     appear stable and consistent with his prior history. 4. Hypokalemia, repleted and will continue his home potassium supplements. 5. Hyperbilirubinemia which is probably secondary to his hemolytic     anemia.  DISPOSITION:  The patient is discharged home in stable condition.  FOLLOWUP RECOMMENDATIONS:  With Dr. [name] in the next 4 to 5 days with repeat CBC.  Time taken in coordinating this discharge is 35 minutes.     Marcellus Scott, MD     AH/MEDQ  D:  03/11/2010  T:  03/11/2010  Job:  [id number]  cc:   Lorelle Formosa, M.D. Fax: [phone]  G. Dorene Grebe, M.D. Fax: [phone]  Electronically Signed by Marcellus Scott MD on 03/21/2010 08:12:45 PM

## 2010-03-22 NOTE — Progress Notes (Signed)
[name], [name]             ACCOUNT NO.:  [id number]  MEDICAL RECORD NO.:  [id number]           PATIENT TYPE:  I  LOCATION:  1304                         FACILITY:  [facility]  PHYSICIAN:  [name], M.D.   DATE OF BIRTH:  October 01, 1980                                PROGRESS NOTE   PRIMARY CARE PHYSICIAN: Lorelle Formosa, M.D.  CURRENT DIAGNOSES: 1. Sickle cell anemia with vasoocclusive crisis. 2. Left humeral avascular necrosis. 3. History of pulmonary embolism. 4. Chronic leukocytosis 5. Chronic thrombocytosis. 6. Hyperbilirubinemia secondary to hemolytic anemia. 7. Hypokalemia, resolved.  DISCHARGE MEDICATIONS: To be dictated at the time of actual discharge.  CONSULTATIONS: Burnard Bunting, M.D. of Orthopedics.  BRIEF ADMISSION HISTORY OF PRESENT ILLNESS: The patient is a 30 year old male with a past medical history of sickle cell anemia who presented to the hospital with a sickle cell pain crisis.  The patient complained of pain involving his legs, ribs, flanks, and chest.  He was referred to the Hospitalist Service for pain control.  For the full details, please see the dictated report done by Dr. Pearson Grippe.  PROCEDURES AND DIAGNOSTIC STUDIES: 1. Chest x-ray on March 05, 2010, showed chronic interstitial     changes without evidence for superimposed pneumonia with mild     cardiac enlargement. 2. Left shoulder films on March 09, 2010, showed diffuse     osteosclerosis of sickle cell disease with stable appearance of     avascular necrosis changes at the left humeral head.  No acute     abnormalities.  DISCHARGE LABORATORY VALUES: Will be dictated at the time of actual discharge.  HOSPITAL COURSE BY PROBLEM: 1. Sickle cell anemia with vasoocclusive crisis:  The patient was     admitted and put on IV fluids, IV pain medications, folic acid, and     Hydrea.  He was also provided with a heating pad to his left     shoulder.  His pain is currently  controlled, though he still     requires IV pain medications to achieve this control.  He has     received 1 unit of packed red blood cells for a hemoglobin of 6.3.     We are continuing supportive care until his crisis resolves. 2. Avascular necrosis of the left shoulder:  The patient has had     severe pain in his left shoulder.  He has had steroid injections in     the past which seemed to have alleviated his pain and therefore we     have consulted Dr. [name] for consideration of a steroid shot. 3. Chronic leukocytosis and thrombocytosis:  The patient's counts     appear to be stable and consistent with his known history of this. 4. Hypokalemia:  The patient was repleted. 5. Hyperbilirubinemia:  The patient's hyperbilirubinemia is secondary     to hemolytic anemia.  This should improve as his vasoocclusive     crisis resolves.  DISPOSITION: The patient is not yet medically stable for discharge.  A discharge summary addendum will be dictated at the time of actual discharge.     [name]  [name], M.D.     CR/MEDQ  D:  03/09/2010  T:  03/09/2010  Job:  [id number]  cc:   Lorelle Formosa, M.D. Fax: [phone]  Electronically Signed by [name] M.D. on 03/22/2010 04:23:26 PM

## 2010-03-23 ENCOUNTER — Emergency Department (HOSPITAL_COMMUNITY)
Admission: EM | Admit: 2010-03-23 | Discharge: 2010-03-24 | Disposition: A | Discharge status: Home / Self Care | Payer: Medicare Other | Attending: Emergency Medicine | Admitting: Emergency Medicine

## 2010-03-23 DIAGNOSIS — IMO0001 Reserved for inherently not codable concepts without codable children: Secondary | ICD-10-CM | POA: Insufficient documentation

## 2010-03-23 DIAGNOSIS — D5700 Hb-SS disease with crisis, unspecified: Secondary | ICD-10-CM | POA: Insufficient documentation

## 2010-03-23 DIAGNOSIS — Z86718 Personal history of other venous thrombosis and embolism: Secondary | ICD-10-CM | POA: Insufficient documentation

## 2010-03-23 DIAGNOSIS — R197 Diarrhea, unspecified: Secondary | ICD-10-CM | POA: Insufficient documentation

## 2010-03-24 LAB — DIFFERENTIAL
Basophils Absolute: 0.2 10*3/uL — ABNORMAL HIGH (ref 0.0–0.1)
Eosinophils Relative: 3 % (ref 0–5)
Lymphs Abs: 3.2 10*3/uL (ref 0.7–4.0)
Monocytes Absolute: 1.9 10*3/uL — ABNORMAL HIGH (ref 0.1–1.0)
Neutrophils Relative %: 69 % (ref 43–77)

## 2010-03-24 LAB — CBC
MCV: 95.3 fL (ref 78.0–100.0)
Platelets: 630 10*3/uL — ABNORMAL HIGH (ref 150–400)
RBC: 3.01 MIL/uL — ABNORMAL LOW (ref 4.22–5.81)
RDW: 16.3 % — ABNORMAL HIGH (ref 11.5–15.5)
WBC: 18.6 10*3/uL — ABNORMAL HIGH (ref 4.0–10.5)

## 2010-03-24 LAB — RETICULOCYTES
RBC.: 3.01 MIL/uL — ABNORMAL LOW (ref 4.22–5.81)
Retic Count, Absolute: 219.7 10*3/uL — ABNORMAL HIGH (ref 19.0–186.0)
Retic Ct Pct: 7.3 % — ABNORMAL HIGH (ref 0.4–3.1)

## 2010-03-24 LAB — URINALYSIS, ROUTINE W REFLEX MICROSCOPIC
Bilirubin Urine: NEGATIVE
Ketones, ur: NEGATIVE mg/dL
Nitrite: NEGATIVE
Protein, ur: NEGATIVE mg/dL
Urobilinogen, UA: 1 mg/dL (ref 0.0–1.0)
pH: 6.5 (ref 5.0–8.0)

## 2010-03-24 LAB — POCT I-STAT, CHEM 8
BUN: <3 mg/dL — ABNORMAL LOW (ref 6–23)
Creatinine, Ser: 0.7 mg/dL (ref 0.4–1.5)
Hemoglobin: 11.2 g/dL — ABNORMAL LOW (ref 13.0–17.0)
Potassium: 3.5 mEq/L (ref 3.5–5.1)
Sodium: 143 mEq/L (ref 135–145)

## 2010-03-25 ENCOUNTER — Inpatient Hospital Stay (HOSPITAL_COMMUNITY)
Admission: EM | Admit: 2010-03-25 | Discharge: 2010-04-06 | DRG: 812 | Disposition: A | Discharge status: Home / Self Care | Payer: Medicare Other | Attending: Internal Medicine | Admitting: Internal Medicine

## 2010-03-25 ENCOUNTER — Emergency Department (HOSPITAL_COMMUNITY): Payer: Medicare Other

## 2010-03-25 DIAGNOSIS — M87029 Idiopathic aseptic necrosis of unspecified humerus: Secondary | ICD-10-CM | POA: Diagnosis present

## 2010-03-25 DIAGNOSIS — D5700 Hb-SS disease with crisis, unspecified: Principal | ICD-10-CM | POA: Diagnosis present

## 2010-03-25 DIAGNOSIS — J9819 Other pulmonary collapse: Secondary | ICD-10-CM | POA: Diagnosis present

## 2010-03-25 DIAGNOSIS — F411 Generalized anxiety disorder: Secondary | ICD-10-CM | POA: Diagnosis present

## 2010-03-25 DIAGNOSIS — D72829 Elevated white blood cell count, unspecified: Secondary | ICD-10-CM | POA: Diagnosis present

## 2010-03-25 DIAGNOSIS — Z86711 Personal history of pulmonary embolism: Secondary | ICD-10-CM

## 2010-03-25 LAB — DIFFERENTIAL
Basophils Absolute: 0.3 10*3/uL — ABNORMAL HIGH (ref 0.0–0.1)
Basophils Relative: 2 % — ABNORMAL HIGH (ref 0–1)
Eosinophils Absolute: 0.3 10*3/uL (ref 0.0–0.7)
Lymphocytes Relative: 17 % (ref 12–46)
Lymphs Abs: 2.3 10*3/uL (ref 0.7–4.0)
Monocytes Absolute: 1.9 10*3/uL — ABNORMAL HIGH (ref 0.1–1.0)
Neutro Abs: 9 10*3/uL — ABNORMAL HIGH (ref 1.7–7.7)

## 2010-03-25 LAB — CBC
MCH: 31 pg (ref 26.0–34.0)
MCHC: 32.8 g/dL (ref 30.0–36.0)
MCV: 94.5 fL (ref 78.0–100.0)
Platelets: 636 10*3/uL — ABNORMAL HIGH (ref 150–400)
RBC: 2.9 MIL/uL — ABNORMAL LOW (ref 4.22–5.81)

## 2010-03-25 LAB — RETICULOCYTES: Retic Ct Pct: 6.5 % — ABNORMAL HIGH (ref 0.4–3.1)

## 2010-03-25 LAB — MRSA PCR SCREENING: MRSA by PCR: NEGATIVE

## 2010-03-26 LAB — BASIC METABOLIC PANEL
BUN: 3 mg/dL — ABNORMAL LOW (ref 6–23)
CO2: 21 mEq/L (ref 19–32)
Calcium: 8.6 mg/dL (ref 8.4–10.5)
Glucose, Bld: 94 mg/dL (ref 70–99)
Potassium: 3.6 mEq/L (ref 3.5–5.1)
Sodium: 139 mEq/L (ref 135–145)

## 2010-03-26 LAB — CBC
HCT: 23.4 % — ABNORMAL LOW (ref 39.0–52.0)
Hemoglobin: 7.8 g/dL — ABNORMAL LOW (ref 13.0–17.0)
MCHC: 33.3 g/dL (ref 30.0–36.0)
MCV: 95.1 fL (ref 78.0–100.0)

## 2010-03-27 LAB — COMPREHENSIVE METABOLIC PANEL
Alkaline Phosphatase: 83 U/L (ref 39–117)
BUN: 6 mg/dL (ref 6–23)
Calcium: 8.6 mg/dL (ref 8.4–10.5)
Creatinine, Ser: 0.5 mg/dL (ref 0.4–1.5)
Glucose, Bld: 93 mg/dL (ref 70–99)
Total Protein: 7.1 g/dL (ref 6.0–8.3)

## 2010-03-27 LAB — CBC
HCT: 23.3 % — ABNORMAL LOW (ref 39.0–52.0)
MCH: 31.7 pg (ref 26.0–34.0)
MCHC: 33.9 g/dL (ref 30.0–36.0)
MCV: 93.6 fL (ref 78.0–100.0)
RDW: 16.8 % — ABNORMAL HIGH (ref 11.5–15.5)

## 2010-03-30 LAB — CBC
HCT: 21 % — ABNORMAL LOW (ref 39.0–52.0)
Hemoglobin: 7 g/dL — ABNORMAL LOW (ref 13.0–17.0)
MCV: 94.6 fL (ref 78.0–100.0)
Platelets: 523 10*3/uL — ABNORMAL HIGH (ref 150–400)
RBC: 2.22 MIL/uL — ABNORMAL LOW (ref 4.22–5.81)
WBC: 16 10*3/uL — ABNORMAL HIGH (ref 4.0–10.5)

## 2010-03-30 LAB — COMPREHENSIVE METABOLIC PANEL
Albumin: 4.1 g/dL (ref 3.5–5.2)
Alkaline Phosphatase: 121 U/L — ABNORMAL HIGH (ref 39–117)
BUN: 8 mg/dL (ref 6–23)
Chloride: 108 mEq/L (ref 96–112)
Potassium: 4.2 mEq/L (ref 3.5–5.1)
Total Bilirubin: 6.7 mg/dL — ABNORMAL HIGH (ref 0.3–1.2)

## 2010-03-31 ENCOUNTER — Inpatient Hospital Stay (HOSPITAL_COMMUNITY): Payer: Medicare Other

## 2010-03-31 LAB — COMPREHENSIVE METABOLIC PANEL
ALT: 53 U/L (ref 0–53)
AST: 57 U/L — ABNORMAL HIGH (ref 0–37)
Albumin: 4.5 g/dL (ref 3.5–5.2)
Calcium: 9.3 mg/dL (ref 8.4–10.5)
GFR calc Af Amer: >60 mL/min (ref >60)
Sodium: 139 mEq/L (ref 135–145)
Total Protein: 8.1 g/dL (ref 6.0–8.3)

## 2010-03-31 LAB — CROSSMATCH
Antibody Screen: NEGATIVE
Unit division: 0

## 2010-03-31 LAB — URINALYSIS, ROUTINE W REFLEX MICROSCOPIC
Hgb urine dipstick: NEGATIVE
Nitrite: NEGATIVE
Protein, ur: NEGATIVE mg/dL
Urobilinogen, UA: 1 mg/dL (ref 0.0–1.0)

## 2010-03-31 LAB — CBC
Hemoglobin: 8.3 g/dL — ABNORMAL LOW (ref 13.0–17.0)
MCHC: 32.7 g/dL (ref 30.0–36.0)
Platelets: 543 10*3/uL — ABNORMAL HIGH (ref 150–400)
RDW: 18.5 % — ABNORMAL HIGH (ref 11.5–15.5)

## 2010-04-01 LAB — CULTURE, BLOOD (ROUTINE X 2)
Culture  Setup Time: 201202240500
Culture  Setup Time: 201202240500
Culture: NO GROWTH

## 2010-04-01 LAB — COMPREHENSIVE METABOLIC PANEL
Albumin: 3.9 g/dL (ref 3.5–5.2)
Alkaline Phosphatase: 107 U/L (ref 39–117)
BUN: 7 mg/dL (ref 6–23)
Creatinine, Ser: 0.47 mg/dL (ref 0.4–1.5)
Potassium: 4.3 mEq/L (ref 3.5–5.1)
Total Protein: 7.8 g/dL (ref 6.0–8.3)

## 2010-04-01 LAB — URINE CULTURE
Culture  Setup Time: 201202292130
Special Requests: NEGATIVE

## 2010-04-01 LAB — CBC
MCV: 93.9 fL (ref 78.0–100.0)
RDW: 18.1 % — ABNORMAL HIGH (ref 11.5–15.5)
WBC: 15.6 10*3/uL — ABNORMAL HIGH (ref 4.0–10.5)

## 2010-04-02 LAB — COMPREHENSIVE METABOLIC PANEL
Albumin: 4 g/dL (ref 3.5–5.2)
BUN: 7 mg/dL (ref 6–23)
Creatinine, Ser: 0.6 mg/dL (ref 0.4–1.5)
Total Protein: 7.4 g/dL (ref 6.0–8.3)

## 2010-04-02 LAB — CBC
MCH: 31.4 pg (ref 26.0–34.0)
MCHC: 32.7 g/dL (ref 30.0–36.0)
MCV: 96.1 fL (ref 78.0–100.0)
Platelets: 506 10*3/uL — ABNORMAL HIGH (ref 150–400)
RDW: 18.2 % — ABNORMAL HIGH (ref 11.5–15.5)

## 2010-04-02 IMAGING — CR DG CHEST 1V PORT
1 series · 1 of 1 positions shown · non-contrast
Comparison: 03/22/2007

CLINICAL DATA: There is a history of sickle cell.  Chest pain.

PORTABLE CHEST - 1 VIEW

[AP]
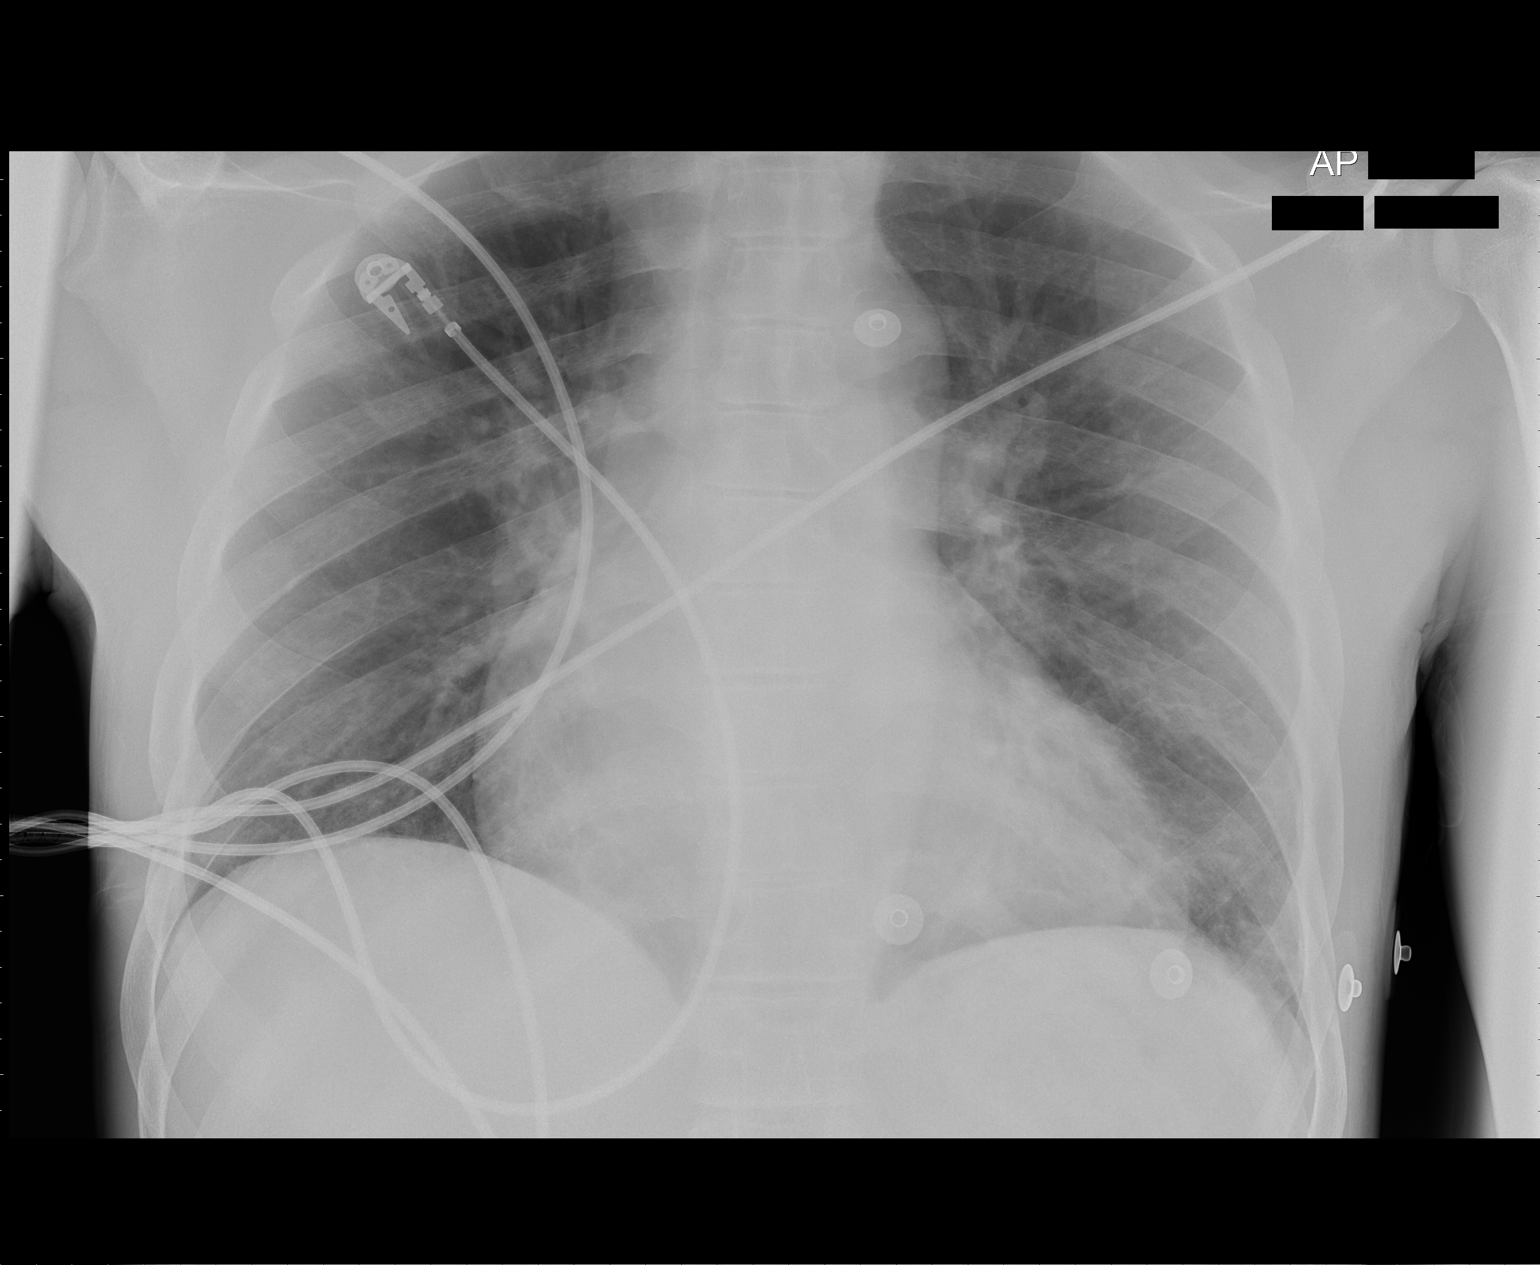

[1 of 1 positions shown; findings below may reference images not displayed]

FINDINGS: Scarring left lung base.  Negative for edema or
infiltrates.  Heart upper normal.  Mediastinum and hila are
negative for adenopathy
IMPRESSION: Stable chest

## 2010-04-02 IMAGING — CT CT ANGIO CHEST
2 of 5 series · 19 of 36 positions shown · IV contrast (APPLIED)
Comparison: CT angio chest 03/19/2007.

CLINICAL DATA: History of sickle cell disease.  Chest pain.

CT ANGIOGRAPHY CHEST 05/10/2007:
TECHNIQUE: Multidetector CT imaging of the chest using the
standard protocol during bolus administration of intravenous
contrast. Multiplanar reconstructed images obtained and reviewed to
evaluate the vascular anatomy.
For the purposes of cumulative lifetime radiation dose, this is the
4th CT examination of the chest since November 2006; this in
addition to innumerable chest x-rays dating back to 8555, which is
as far back in time as we have reports.
Contrast: 100 ml Gmnipaque-MFF IV.

[Series 7: pulm embolism 1.0 thins · axial · 0.69mm/px · z∈[-206,+45]mm · 16 of 281 slices shown]
[im 15/281  lung]
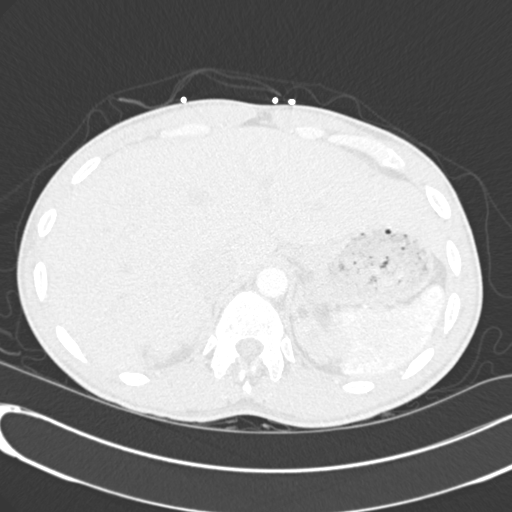
[im 30/281  mediastinal]
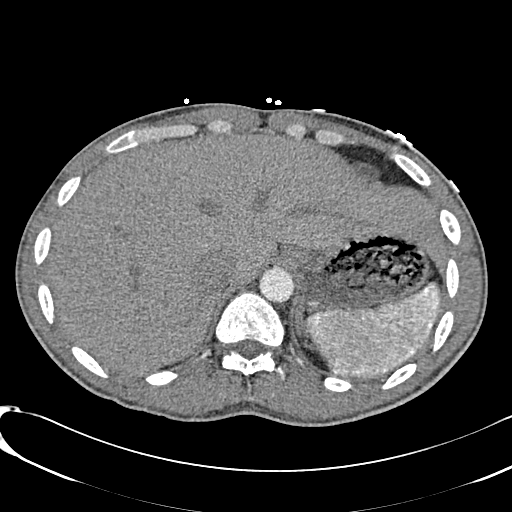
[im 45/281  lung]
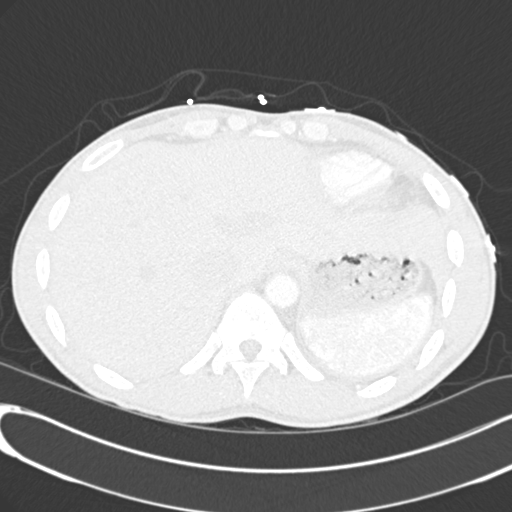
[im 59/281  mediastinal]
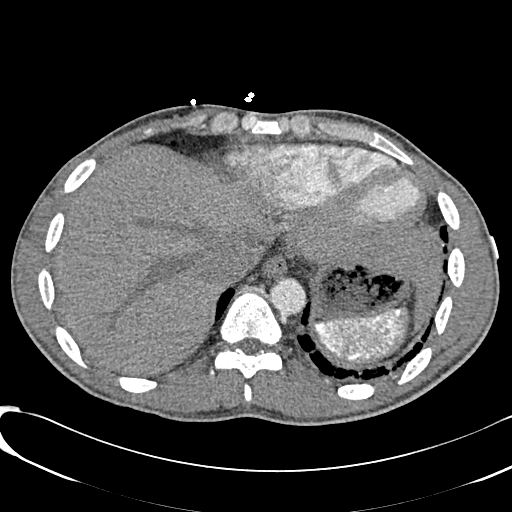
[im 89/281  lung]
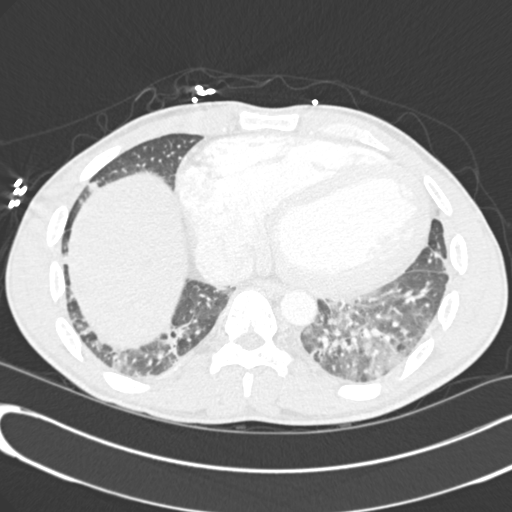
[im 104/281  mediastinal]
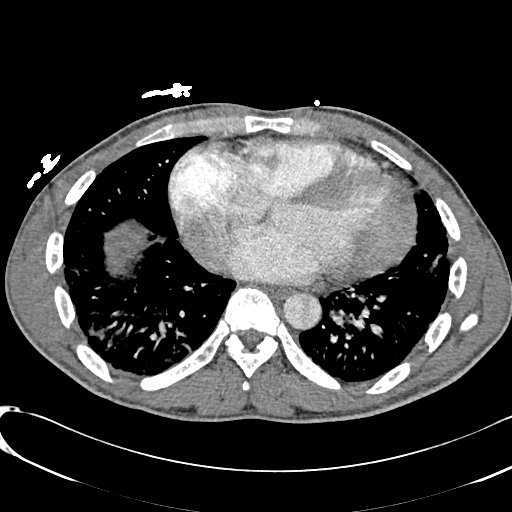
[im 118/281  lung]
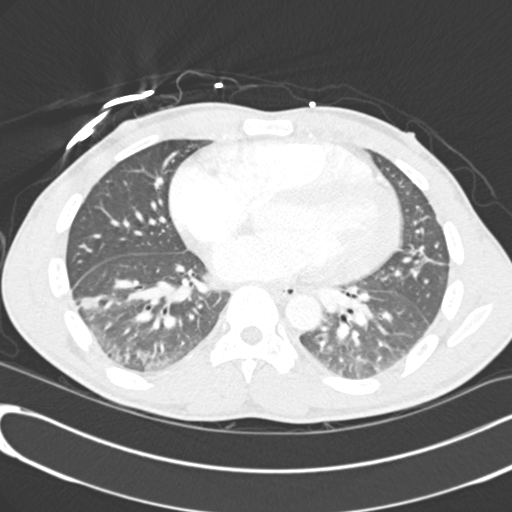
[im 133/281  mediastinal]
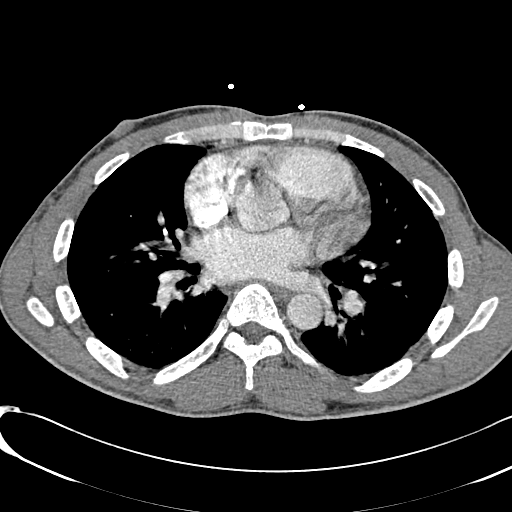
[im 148/281  lung]
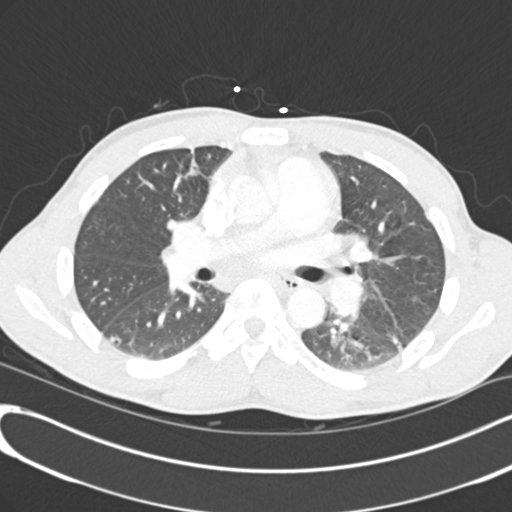
[im 163/281  mediastinal]
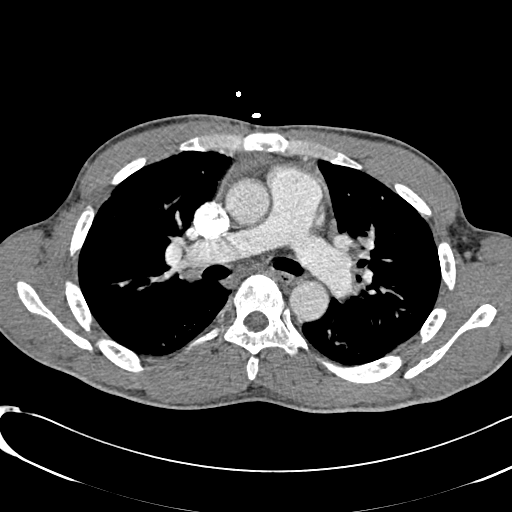
[im 177/281  lung]
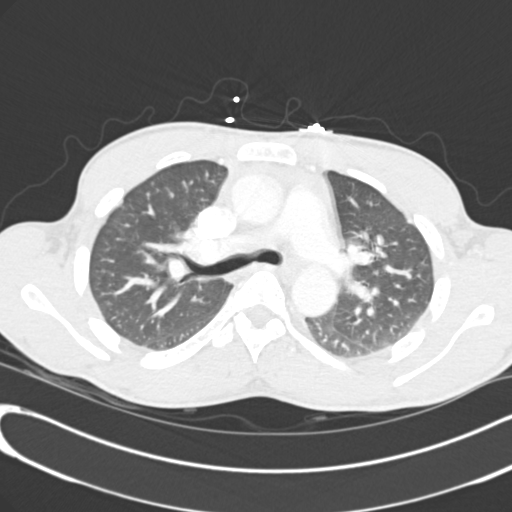
[im 192/281  mediastinal]
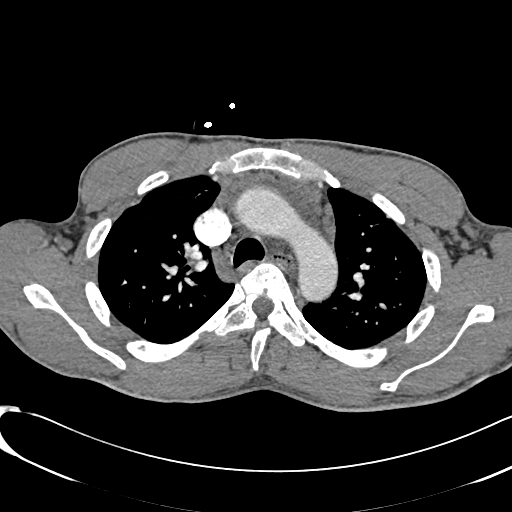
[im 222/281  lung]
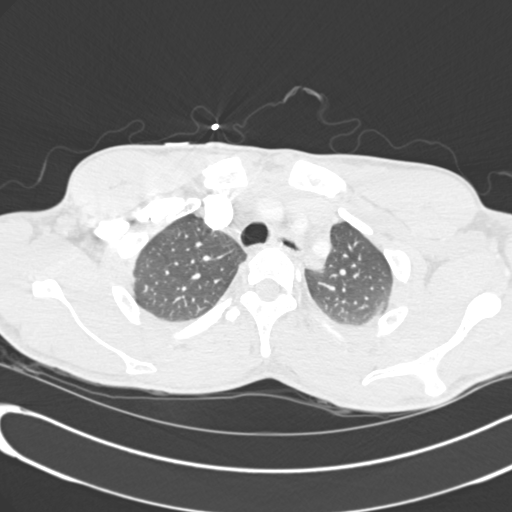
[im 236/281  mediastinal]
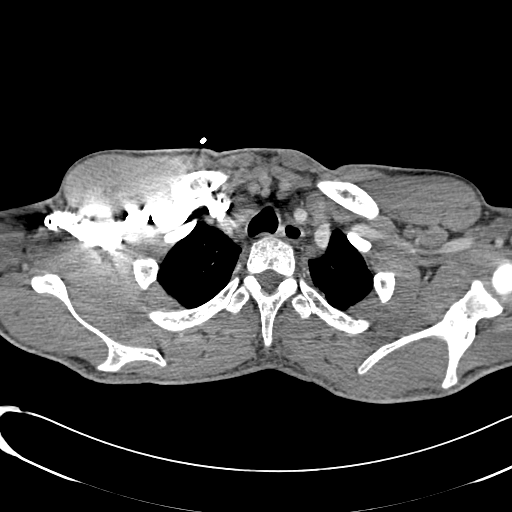
[im 251/281  lung]
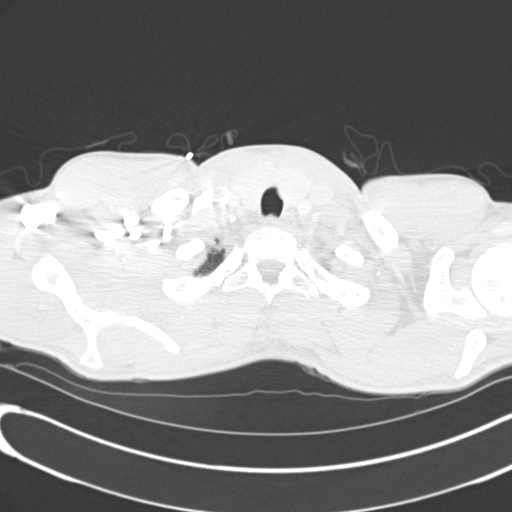
[im 266/281  mediastinal]
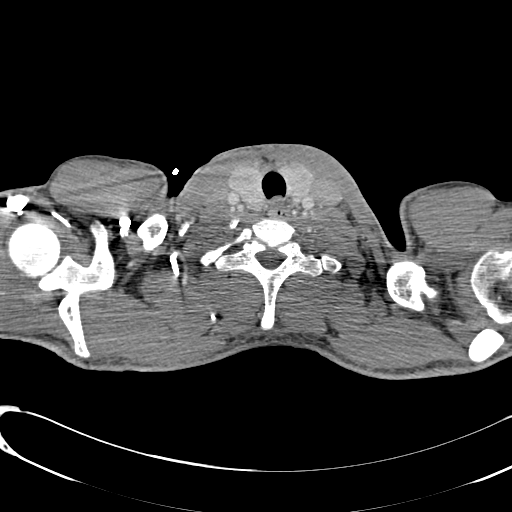

[Series 9: pulm embolism 2.0 cor · coronal · 0.71mm/px · 3 of 109 slices shown]
[im 22/109  mediastinal]
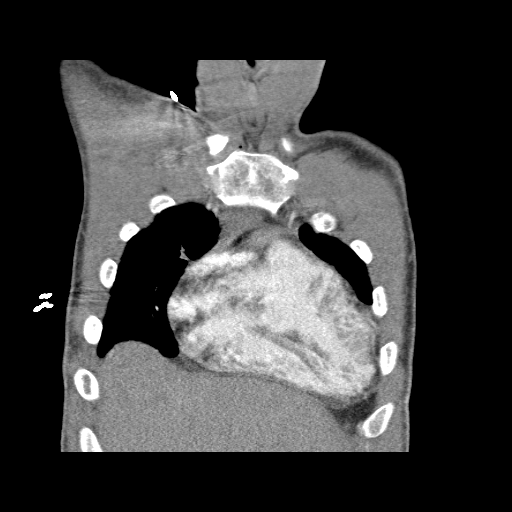
[im 44/109  mediastinal]
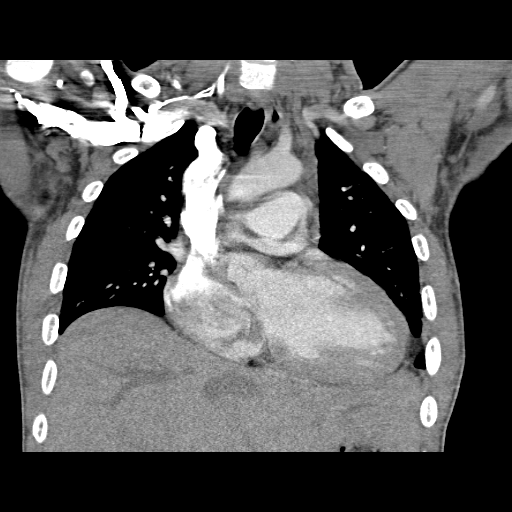
[im 65/109  mediastinal]
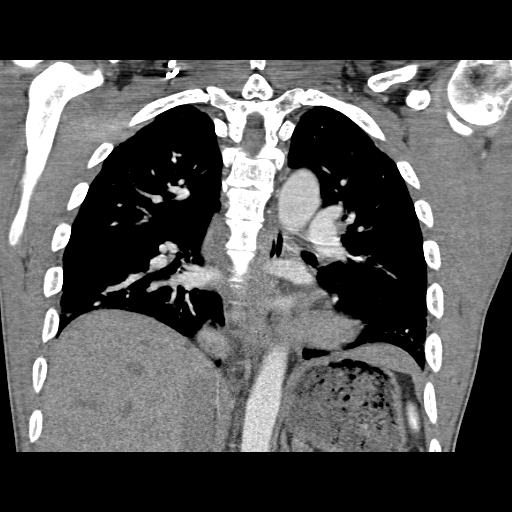

[19 of 36 positions shown; findings below may reference images not displayed]

FINDINGS: Contrast opacification of the pulmonary arteries is good,
yielding a good diagnostic quality study.  No filling defects
within either main pulmonary artery or their branches in either
lung to suggest pulmonary embolism.  Heart moderately enlarged with
left ventricular predominance, unchanged.  No visible coronary
artery calcification.  No pericardial effusion.  Thoracic and upper
abdominal aorta normal in appearance.  No significant
lymphadenopathy in the chest.  Residual thymic tissue in the
anterior mediastinum.

Airspace opacities in the lower lobes with associated crowding of
the bronchovascular markings.  Upper lobes clear.  No pleural
effusions.

Numerous calcifications throughout the spleen, consistent with
dystrophic calcification related to multiple infarcts.  Remaining
visualized upper abdomen unremarkable for the early arterial phase
of enhancement.  Bone window images demonstrating early changes of
sickle osteopathy.
IMPRESSION: 1.  No evidence of pulmonary embolism.
2.  Bilateral lower lobe atelectasis.
3.  Stable moderate cardiomegaly.

## 2010-04-03 LAB — CBC
Hemoglobin: 7.1 g/dL — ABNORMAL LOW (ref 13.0–17.0)
MCH: 31.6 pg (ref 26.0–34.0)
MCV: 97.3 fL (ref 78.0–100.0)
RBC: 2.25 MIL/uL — ABNORMAL LOW (ref 4.22–5.81)

## 2010-04-04 LAB — COMPREHENSIVE METABOLIC PANEL
Alkaline Phosphatase: 111 U/L (ref 39–117)
BUN: 5 mg/dL — ABNORMAL LOW (ref 6–23)
CO2: 26 mEq/L (ref 19–32)
GFR calc non Af Amer: >60 mL/min (ref >60)
Glucose, Bld: 106 mg/dL — ABNORMAL HIGH (ref 70–99)
Potassium: 3.7 mEq/L (ref 3.5–5.1)
Total Bilirubin: 2.7 mg/dL — ABNORMAL HIGH (ref 0.3–1.2)
Total Protein: 7.3 g/dL (ref 6.0–8.3)

## 2010-04-04 LAB — CBC
HCT: 23.5 % — ABNORMAL LOW (ref 39.0–52.0)
Hemoglobin: 7.4 g/dL — ABNORMAL LOW (ref 13.0–17.0)
MCHC: 31.5 g/dL (ref 30.0–36.0)
MCV: 99.2 fL (ref 78.0–100.0)
RDW: 18.5 % — ABNORMAL HIGH (ref 11.5–15.5)
WBC: 16.1 10*3/uL — ABNORMAL HIGH (ref 4.0–10.5)

## 2010-04-04 LAB — PREPARE RBC (CROSSMATCH)

## 2010-04-05 LAB — TYPE AND SCREEN
ABO/RH(D): O POS
Antibody Screen: NEGATIVE

## 2010-04-05 LAB — COMPREHENSIVE METABOLIC PANEL
AST: 49 U/L — ABNORMAL HIGH (ref 0–37)
Albumin: 3.7 g/dL (ref 3.5–5.2)
Alkaline Phosphatase: 120 U/L — ABNORMAL HIGH (ref 39–117)
Chloride: 104 mEq/L (ref 96–112)
GFR calc Af Amer: >60 mL/min (ref >60)
Potassium: 3.8 mEq/L (ref 3.5–5.1)
Sodium: 136 mEq/L (ref 135–145)
Total Bilirubin: 2.6 mg/dL — ABNORMAL HIGH (ref 0.3–1.2)
Total Protein: 7.3 g/dL (ref 6.0–8.3)

## 2010-04-05 LAB — CBC
Platelets: 498 10*3/uL — ABNORMAL HIGH (ref 150–400)
RBC: 2.6 MIL/uL — ABNORMAL LOW (ref 4.22–5.81)
RDW: 18.3 % — ABNORMAL HIGH (ref 11.5–15.5)
WBC: 15.1 10*3/uL — ABNORMAL HIGH (ref 4.0–10.5)

## 2010-04-12 LAB — CBC
HCT: 22.9 % — ABNORMAL LOW (ref 39.0–52.0)
HCT: 22.9 % — ABNORMAL LOW (ref 39.0–52.0)
HCT: 24.5 % — ABNORMAL LOW (ref 39.0–52.0)
HCT: 24.6 % — ABNORMAL LOW (ref 39.0–52.0)
Hemoglobin: 7.4 g/dL — ABNORMAL LOW (ref 13.0–17.0)
Hemoglobin: 8.1 g/dL — ABNORMAL LOW (ref 13.0–17.0)
MCH: 30.3 pg (ref 26.0–34.0)
MCH: 30.6 pg (ref 26.0–34.0)
MCH: 30.8 pg (ref 26.0–34.0)
MCH: 31.2 pg (ref 26.0–34.0)
MCH: 31.4 pg (ref 26.0–34.0)
MCHC: 32.6 g/dL (ref 30.0–36.0)
MCHC: 32.9 g/dL (ref 30.0–36.0)
MCHC: 33.1 g/dL (ref 30.0–36.0)
MCHC: 33.2 g/dL (ref 30.0–36.0)
MCHC: 33.8 g/dL (ref 30.0–36.0)
MCHC: 33.3 g/dL (ref 30.0–36.0)
MCV: 92.2 fL (ref 78.0–100.0)
MCV: 92.7 fL (ref 78.0–100.0)
MCV: 92.7 fL (ref 78.0–100.0)
MCV: 92.8 fL (ref 78.0–100.0)
MCV: 93.2 fL (ref 78.0–100.0)
MCV: 95 fL (ref 78.0–100.0)
MCV: 93.2 fL (ref 78.0–100.0)
Platelets: 107 10*3/uL — ABNORMAL LOW (ref 150–400)
Platelets: 490 10*3/uL — ABNORMAL HIGH (ref 150–400)
Platelets: 551 10*3/uL — ABNORMAL HIGH (ref 150–400)
Platelets: 558 10*3/uL — ABNORMAL HIGH (ref 150–400)
Platelets: 577 10*3/uL — ABNORMAL HIGH (ref 150–400)
Platelets: 637 10*3/uL — ABNORMAL HIGH (ref 150–400)
Platelets: 470 10*3/uL — ABNORMAL HIGH (ref 150–400)
Platelets: 536 10*3/uL — ABNORMAL HIGH (ref 150–400)
RBC: 2.32 MIL/uL — ABNORMAL LOW (ref 4.22–5.81)
RBC: 2.36 MIL/uL — ABNORMAL LOW (ref 4.22–5.81)
RBC: 2.58 MIL/uL — ABNORMAL LOW (ref 4.22–5.81)
RBC: 2.36 MIL/uL — ABNORMAL LOW (ref 4.22–5.81)
RDW: 16.8 % — ABNORMAL HIGH (ref 11.5–15.5)
RDW: 17.1 % — ABNORMAL HIGH (ref 11.5–15.5)
RDW: 17.4 % — ABNORMAL HIGH (ref 11.5–15.5)
RDW: 17.6 % — ABNORMAL HIGH (ref 11.5–15.5)
RDW: 17.9 % — ABNORMAL HIGH (ref 11.5–15.5)
RDW: 18.6 % — ABNORMAL HIGH (ref 11.5–15.5)
RDW: 18.9 % — ABNORMAL HIGH (ref 11.5–15.5)
RDW: 20.3 % — ABNORMAL HIGH (ref 11.5–15.5)
RDW: 20.6 % — ABNORMAL HIGH (ref 11.5–15.5)
WBC: 16.2 10*3/uL — ABNORMAL HIGH (ref 4.0–10.5)
WBC: 16.5 10*3/uL — ABNORMAL HIGH (ref 4.0–10.5)
WBC: 23 10*3/uL — ABNORMAL HIGH (ref 4.0–10.5)
WBC: 21.7 10*3/uL — ABNORMAL HIGH (ref 4.0–10.5)
WBC: 27.7 10*3/uL — ABNORMAL HIGH (ref 4.0–10.5)

## 2010-04-12 LAB — COMPREHENSIVE METABOLIC PANEL
ALT: 14 U/L (ref 0–53)
ALT: 17 U/L (ref 0–53)
ALT: 18 U/L (ref 0–53)
AST: 18 U/L (ref 0–37)
AST: 25 U/L (ref 0–37)
AST: 30 U/L (ref 0–37)
AST: 62 U/L — ABNORMAL HIGH (ref 0–37)
Albumin: 3.7 g/dL (ref 3.5–5.2)
Albumin: 3.9 g/dL (ref 3.5–5.2)
Albumin: 4.3 g/dL (ref 3.5–5.2)
Albumin: 4.3 g/dL (ref 3.5–5.2)
Albumin: 4 g/dL (ref 3.5–5.2)
Alkaline Phosphatase: 79 U/L (ref 39–117)
Alkaline Phosphatase: 83 U/L (ref 39–117)
Alkaline Phosphatase: 86 U/L (ref 39–117)
Alkaline Phosphatase: 78 U/L (ref 39–117)
BUN: 4 mg/dL — ABNORMAL LOW (ref 6–23)
BUN: 5 mg/dL — ABNORMAL LOW (ref 6–23)
BUN: 8 mg/dL (ref 6–23)
CO2: 22 mEq/L (ref 19–32)
CO2: 24 mEq/L (ref 19–32)
Calcium: 8.9 mg/dL (ref 8.4–10.5)
Calcium: 9 mg/dL (ref 8.4–10.5)
Calcium: 9.2 mg/dL (ref 8.4–10.5)
Calcium: 8.9 mg/dL (ref 8.4–10.5)
Chloride: 107 mEq/L (ref 96–112)
Chloride: 107 mEq/L (ref 96–112)
Chloride: 112 mEq/L (ref 96–112)
Creatinine, Ser: 0.57 mg/dL (ref 0.4–1.5)
Creatinine, Ser: 0.6 mg/dL (ref 0.4–1.5)
Creatinine, Ser: 0.72 mg/dL (ref 0.4–1.5)
GFR calc Af Amer: >60 mL/min (ref >60)
GFR calc Af Amer: >60 mL/min (ref >60)
GFR calc Af Amer: >60 mL/min (ref >60)
GFR calc Af Amer: >60 mL/min (ref >60)
GFR calc non Af Amer: >60 mL/min (ref >60)
GFR calc non Af Amer: >60 mL/min (ref >60)
Glucose, Bld: 101 mg/dL — ABNORMAL HIGH (ref 70–99)
Glucose, Bld: 107 mg/dL — ABNORMAL HIGH (ref 70–99)
Glucose, Bld: 111 mg/dL — ABNORMAL HIGH (ref 70–99)
Potassium: 2.9 mEq/L — ABNORMAL LOW (ref 3.5–5.1)
Potassium: 3.6 mEq/L (ref 3.5–5.1)
Potassium: 3.7 mEq/L (ref 3.5–5.1)
Potassium: 4.1 mEq/L (ref 3.5–5.1)
Sodium: 139 mEq/L (ref 135–145)
Sodium: 143 mEq/L (ref 135–145)
Sodium: 144 mEq/L (ref 135–145)
Sodium: 138 mEq/L (ref 135–145)
Total Bilirubin: 3.2 mg/dL — ABNORMAL HIGH (ref 0.3–1.2)
Total Bilirubin: 3.9 mg/dL — ABNORMAL HIGH (ref 0.3–1.2)
Total Bilirubin: 3.9 mg/dL — ABNORMAL HIGH (ref 0.3–1.2)
Total Protein: 7 g/dL (ref 6.0–8.3)
Total Protein: 7.3 g/dL (ref 6.0–8.3)
Total Protein: 7.5 g/dL (ref 6.0–8.3)
Total Protein: 7.7 g/dL (ref 6.0–8.3)
Total Protein: 7.8 g/dL (ref 6.0–8.3)
Total Protein: 7.1 g/dL (ref 6.0–8.3)

## 2010-04-12 LAB — HEPATIC FUNCTION PANEL
Albumin: 4.4 g/dL (ref 3.5–5.2)
Albumin: 4.4 g/dL (ref 3.5–5.2)
Bilirubin, Direct: 0.5 mg/dL — ABNORMAL HIGH (ref 0.0–0.3)
Indirect Bilirubin: 5.4 mg/dL — ABNORMAL HIGH (ref 0.3–0.9)
Total Bilirubin: 3.7 mg/dL — ABNORMAL HIGH (ref 0.3–1.2)
Total Protein: 8.1 g/dL (ref 6.0–8.3)

## 2010-04-12 LAB — RETICULOCYTES
RBC.: 2.58 MIL/uL — ABNORMAL LOW (ref 4.22–5.81)
RBC.: 2.06 MIL/uL — ABNORMAL LOW (ref 4.22–5.81)
RBC.: 2.36 MIL/uL — ABNORMAL LOW (ref 4.22–5.81)
Retic Count, Absolute: 319.9 10*3/uL — ABNORMAL HIGH (ref 19.0–186.0)
Retic Ct Pct: 11 % — ABNORMAL HIGH (ref 0.4–3.1)
Retic Ct Pct: 12.4 % — ABNORMAL HIGH (ref 0.4–3.1)
Retic Ct Pct: 7.1 % — ABNORMAL HIGH (ref 0.4–3.1)
Retic Ct Pct: 15.5 % — ABNORMAL HIGH (ref 0.4–3.1)

## 2010-04-12 LAB — URINALYSIS, ROUTINE W REFLEX MICROSCOPIC
Glucose, UA: NEGATIVE mg/dL
Protein, ur: NEGATIVE mg/dL
Protein, ur: NEGATIVE mg/dL
Specific Gravity, Urine: 1.009 (ref 1.005–1.030)
Urobilinogen, UA: 1 mg/dL (ref 0.0–1.0)

## 2010-04-12 LAB — OPIATE, QUANTITATIVE, URINE
Codeine Urine: NEGATIVE NG/ML
Hydrocodone: NEGATIVE NG/ML
Hydromorphone GC/MS Conf: 358 NG/ML — ABNORMAL HIGH
Morphine, Confirm: NEGATIVE NG/ML
Oxycodone, ur: 11322 NG/ML — ABNORMAL HIGH
Oxymorphone: 4611 NG/ML — ABNORMAL HIGH

## 2010-04-12 LAB — BASIC METABOLIC PANEL
Calcium: 9.3 mg/dL (ref 8.4–10.5)
Chloride: 108 mEq/L (ref 96–112)
Creatinine, Ser: 0.61 mg/dL (ref 0.4–1.5)
GFR calc Af Amer: >60 mL/min (ref >60)
GFR calc non Af Amer: >60 mL/min (ref >60)

## 2010-04-12 LAB — DIFFERENTIAL
Basophils Absolute: 0.1 10*3/uL (ref 0.0–0.1)
Basophils Relative: 1 % (ref 0–1)
Eosinophils Absolute: 0.4 10*3/uL (ref 0.0–0.7)
Eosinophils Absolute: 0.6 10*3/uL (ref 0.0–0.7)
Eosinophils Relative: 2 % (ref 0–5)
Eosinophils Relative: 2 % (ref 0–5)
Lymphocytes Relative: 15 % (ref 12–46)
Lymphs Abs: 2.7 10*3/uL (ref 0.7–4.0)
Lymphs Abs: 3.5 10*3/uL (ref 0.7–4.0)
Monocytes Absolute: 2.9 10*3/uL — ABNORMAL HIGH (ref 0.1–1.0)
Monocytes Absolute: 3.9 10*3/uL — ABNORMAL HIGH (ref 0.1–1.0)
Monocytes Relative: 11 % (ref 3–12)
Monocytes Relative: 13 % — ABNORMAL HIGH (ref 3–12)
Neutro Abs: 11.8 10*3/uL — ABNORMAL HIGH (ref 1.7–7.7)
Neutro Abs: 16 10*3/uL — ABNORMAL HIGH (ref 1.7–7.7)
Neutrophils Relative %: 70 % (ref 43–77)
Neutrophils Relative %: 70 % (ref 43–77)
Smear Review: INCREASED

## 2010-04-12 LAB — CROSSMATCH
ABO/RH(D): O POS
Antibody Screen: NEGATIVE
Unit division: 0
Unit division: 0

## 2010-04-12 LAB — URINE DRUGS OF ABUSE SCREEN W ALC, ROUTINE (REF LAB)
Amphetamine Screen, Ur: NEGATIVE
Barbiturate Quant, Ur: NEGATIVE
Benzodiazepines.: NEGATIVE
Cocaine Metabolites: NEGATIVE
Creatinine,U: 103.2 mg/dL
Ethyl Alcohol: <10 mg/dL (ref <10)
Marijuana Metabolite: NEGATIVE
Methadone: NEGATIVE
Opiate Screen, Urine: POSITIVE — AB
Phencyclidine (PCP): NEGATIVE
Propoxyphene: NEGATIVE

## 2010-04-12 LAB — SAMPLE TO BLOOD BANK

## 2010-04-12 LAB — ETHANOL: Alcohol, Ethyl (B): 45 mg/dL — ABNORMAL HIGH (ref 0–10)

## 2010-04-12 LAB — MRSA PCR SCREENING
MRSA by PCR: NEGATIVE
MRSA by PCR: POSITIVE — AB

## 2010-04-12 LAB — H. PYLORI ANTIBODY, IGG: H Pylori IgG: 0.4 {ISR}

## 2010-04-13 ENCOUNTER — Emergency Department (HOSPITAL_COMMUNITY): Payer: Medicare Other

## 2010-04-13 ENCOUNTER — Emergency Department (HOSPITAL_COMMUNITY)
Admission: EM | Admit: 2010-04-13 | Discharge: 2010-04-13 | Disposition: A | Discharge status: Home / Self Care | Payer: Medicare Other | Attending: Emergency Medicine | Admitting: Emergency Medicine

## 2010-04-13 DIAGNOSIS — R079 Chest pain, unspecified: Secondary | ICD-10-CM | POA: Insufficient documentation

## 2010-04-13 DIAGNOSIS — D571 Sickle-cell disease without crisis: Secondary | ICD-10-CM | POA: Insufficient documentation

## 2010-04-13 LAB — CBC
HCT: 29.6 % — ABNORMAL LOW (ref 39.0–52.0)
Hemoglobin: 10 g/dL — ABNORMAL LOW (ref 13.0–17.0)
MCH: 32.8 pg (ref 26.0–34.0)
MCHC: 33.8 g/dL (ref 30.0–36.0)
MCV: 97 fL (ref 78.0–100.0)
Platelets: 583 10*3/uL — ABNORMAL HIGH (ref 150–400)
RBC: 3.05 MIL/uL — ABNORMAL LOW (ref 4.22–5.81)
RDW: 16.6 % — ABNORMAL HIGH (ref 11.5–15.5)
WBC: 17.8 10*3/uL — ABNORMAL HIGH (ref 4.0–10.5)

## 2010-04-13 LAB — DIFFERENTIAL
Basophils Absolute: 0.2 10*3/uL — ABNORMAL HIGH (ref 0.0–0.1)
Basophils Relative: 1 % (ref 0–1)
Eosinophils Absolute: 0.3 10*3/uL (ref 0.0–0.7)
Eosinophils Relative: 2 % (ref 0–5)
Lymphocytes Relative: 15 % (ref 12–46)
Lymphs Abs: 2.7 10*3/uL (ref 0.7–4.0)
Monocytes Absolute: 1.5 10*3/uL — ABNORMAL HIGH (ref 0.1–1.0)
Monocytes Relative: 8 % (ref 3–12)
Neutro Abs: 13.1 10*3/uL — ABNORMAL HIGH (ref 1.7–7.7)
Neutrophils Relative %: 74 % (ref 43–77)

## 2010-04-13 LAB — RETICULOCYTES
RBC.: 3.05 MIL/uL — ABNORMAL LOW (ref 4.22–5.81)
Retic Count, Absolute: 222.7 10*3/uL — ABNORMAL HIGH (ref 19.0–186.0)
Retic Ct Pct: 7.3 % — ABNORMAL HIGH (ref 0.4–3.1)

## 2010-04-13 LAB — POCT I-STAT, CHEM 8
BUN: 4 mg/dL — ABNORMAL LOW (ref 6–23)
Calcium, Ion: 1.07 mmol/L — ABNORMAL LOW (ref 1.12–1.32)
Chloride: 110 mEq/L (ref 96–112)
Creatinine, Ser: 0.7 mg/dL (ref 0.4–1.5)
Glucose, Bld: 98 mg/dL (ref 70–99)
TCO2: 20 mmol/L (ref 0–100)

## 2010-04-14 LAB — CBC
HCT: 19.7 % — ABNORMAL LOW (ref 39.0–52.0)
HCT: 19.9 % — ABNORMAL LOW (ref 39.0–52.0)
HCT: 19.9 % — ABNORMAL LOW (ref 39.0–52.0)
HCT: 20.1 % — ABNORMAL LOW (ref 39.0–52.0)
HCT: 21.5 % — ABNORMAL LOW (ref 39.0–52.0)
HCT: 22.3 % — ABNORMAL LOW (ref 39.0–52.0)
HCT: 23 % — ABNORMAL LOW (ref 39.0–52.0)
HCT: 24.3 % — ABNORMAL LOW (ref 39.0–52.0)
Hemoglobin: 6.6 g/dL — CL (ref 13.0–17.0)
Hemoglobin: 6.6 g/dL — CL (ref 13.0–17.0)
Hemoglobin: 6.7 g/dL — CL (ref 13.0–17.0)
Hemoglobin: 6.9 g/dL — CL (ref 13.0–17.0)
Hemoglobin: 7 g/dL — ABNORMAL LOW (ref 13.0–17.0)
Hemoglobin: 7 g/dL — ABNORMAL LOW (ref 13.0–17.0)
Hemoglobin: 7.1 g/dL — ABNORMAL LOW (ref 13.0–17.0)
Hemoglobin: 7.2 g/dL — ABNORMAL LOW (ref 13.0–17.0)
Hemoglobin: 7.2 g/dL — ABNORMAL LOW (ref 13.0–17.0)
Hemoglobin: 7.4 g/dL — ABNORMAL LOW (ref 13.0–17.0)
Hemoglobin: 7.9 g/dL — ABNORMAL LOW (ref 13.0–17.0)
Hemoglobin: 7.9 g/dL — ABNORMAL LOW (ref 13.0–17.0)
Hemoglobin: 8.2 g/dL — ABNORMAL LOW (ref 13.0–17.0)
MCH: 29.9 pg (ref 26.0–34.0)
MCH: 30 pg (ref 26.0–34.0)
MCH: 30 pg (ref 26.0–34.0)
MCH: 30.1 pg (ref 26.0–34.0)
MCH: 30.2 pg (ref 26.0–34.0)
MCH: 30.3 pg (ref 26.0–34.0)
MCH: 30.5 pg (ref 26.0–34.0)
MCH: 30.6 pg (ref 26.0–34.0)
MCH: 30.8 pg (ref 26.0–34.0)
MCH: 30.9 pg (ref 26.0–34.0)
MCH: 31 pg (ref 26.0–34.0)
MCH: 31 pg (ref 26.0–34.0)
MCH: 31.1 pg (ref 26.0–34.0)
MCH: 31.7 pg (ref 26.0–34.0)
MCH: 32.2 pg (ref 26.0–34.0)
MCH: 33.2 pg (ref 26.0–34.0)
MCH: 33.5 pg (ref 26.0–34.0)
MCHC: 32.3 g/dL (ref 30.0–36.0)
MCHC: 32.6 g/dL (ref 30.0–36.0)
MCHC: 32.8 g/dL (ref 30.0–36.0)
MCHC: 32.9 g/dL (ref 30.0–36.0)
MCHC: 33.1 g/dL (ref 30.0–36.0)
MCHC: 33.2 g/dL (ref 30.0–36.0)
MCHC: 33.2 g/dL (ref 30.0–36.0)
MCHC: 33.2 g/dL (ref 30.0–36.0)
MCHC: 33.2 g/dL (ref 30.0–36.0)
MCHC: 33.3 g/dL (ref 30.0–36.0)
MCHC: 33.3 g/dL (ref 30.0–36.0)
MCHC: 33.5 g/dL (ref 30.0–36.0)
MCHC: 33.6 g/dL (ref 30.0–36.0)
MCHC: 33.7 g/dL (ref 30.0–36.0)
MCHC: 34.5 g/dL (ref 30.0–36.0)
MCV: 89.5 fL (ref 78.0–100.0)
MCV: 90 fL (ref 78.0–100.0)
MCV: 90.5 fL (ref 78.0–100.0)
MCV: 92.2 fL (ref 78.0–100.0)
MCV: 92.9 fL (ref 78.0–100.0)
MCV: 94 fL (ref 78.0–100.0)
MCV: 94.6 fL (ref 78.0–100.0)
MCV: 95.3 fL (ref 78.0–100.0)
MCV: 97.2 fL (ref 78.0–100.0)
MCV: 98.9 fL (ref 78.0–100.0)
MCV: 99.8 fL (ref 78.0–100.0)
Platelets: 372 10*3/uL (ref 150–400)
Platelets: 383 10*3/uL (ref 150–400)
Platelets: 395 10*3/uL (ref 150–400)
Platelets: 407 10*3/uL — ABNORMAL HIGH (ref 150–400)
Platelets: 415 K/uL — ABNORMAL HIGH (ref 150–400)
Platelets: 459 10*3/uL — ABNORMAL HIGH (ref 150–400)
Platelets: 466 10*3/uL — ABNORMAL HIGH (ref 150–400)
Platelets: 468 10*3/uL — ABNORMAL HIGH (ref 150–400)
Platelets: 471 10*3/uL — ABNORMAL HIGH (ref 150–400)
Platelets: 551 10*3/uL — ABNORMAL HIGH (ref 150–400)
Platelets: 560 10*3/uL — ABNORMAL HIGH (ref 150–400)
Platelets: 601 10*3/uL — ABNORMAL HIGH (ref 150–400)
Platelets: 631 10*3/uL — ABNORMAL HIGH (ref 150–400)
Platelets: 775 10*3/uL — ABNORMAL HIGH (ref 150–400)
Platelets: ADEQUATE 10*3/uL (ref 150–400)
RBC: 1.97 MIL/uL — ABNORMAL LOW (ref 4.22–5.81)
RBC: 2.06 MIL/uL — ABNORMAL LOW (ref 4.22–5.81)
RBC: 2.18 MIL/uL — ABNORMAL LOW (ref 4.22–5.81)
RBC: 2.21 MIL/uL — ABNORMAL LOW (ref 4.22–5.81)
RBC: 2.22 MIL/uL — ABNORMAL LOW (ref 4.22–5.81)
RBC: 2.23 MIL/uL — ABNORMAL LOW (ref 4.22–5.81)
RBC: 2.29 MIL/uL — ABNORMAL LOW (ref 4.22–5.81)
RBC: 2.29 MIL/uL — ABNORMAL LOW (ref 4.22–5.81)
RBC: 2.32 MIL/uL — ABNORMAL LOW (ref 4.22–5.81)
RBC: 2.33 MIL/uL — ABNORMAL LOW (ref 4.22–5.81)
RBC: 2.37 MIL/uL — ABNORMAL LOW (ref 4.22–5.81)
RBC: 2.4 MIL/uL — ABNORMAL LOW (ref 4.22–5.81)
RBC: 2.49 MIL/uL — ABNORMAL LOW (ref 4.22–5.81)
RBC: 2.55 MIL/uL — ABNORMAL LOW (ref 4.22–5.81)
RDW: 16.9 % — ABNORMAL HIGH (ref 11.5–15.5)
RDW: 17.7 % — ABNORMAL HIGH (ref 11.5–15.5)
RDW: 18 % — ABNORMAL HIGH (ref 11.5–15.5)
RDW: 18 % — ABNORMAL HIGH (ref 11.5–15.5)
RDW: 18.4 % — ABNORMAL HIGH (ref 11.5–15.5)
RDW: 18.5 % — ABNORMAL HIGH (ref 11.5–15.5)
RDW: 19 % — ABNORMAL HIGH (ref 11.5–15.5)
RDW: 19.5 % — ABNORMAL HIGH (ref 11.5–15.5)
RDW: 22.6 % — ABNORMAL HIGH (ref 11.5–15.5)
WBC: 12.2 10*3/uL — ABNORMAL HIGH (ref 4.0–10.5)
WBC: 13 10*3/uL — ABNORMAL HIGH (ref 4.0–10.5)
WBC: 13.3 10*3/uL — ABNORMAL HIGH (ref 4.0–10.5)
WBC: 13.4 10*3/uL — ABNORMAL HIGH (ref 4.0–10.5)
WBC: 13.8 10*3/uL — ABNORMAL HIGH (ref 4.0–10.5)
WBC: 15.4 10*3/uL — ABNORMAL HIGH (ref 4.0–10.5)
WBC: 15.8 10*3/uL — ABNORMAL HIGH (ref 4.0–10.5)
WBC: 16.1 10*3/uL — ABNORMAL HIGH (ref 4.0–10.5)
WBC: 16.7 10*3/uL — ABNORMAL HIGH (ref 4.0–10.5)
WBC: 17.9 K/uL — ABNORMAL HIGH (ref 4.0–10.5)
WBC: 20.9 10*3/uL — ABNORMAL HIGH (ref 4.0–10.5)

## 2010-04-14 LAB — RETICULOCYTES
RBC.: 2.02 MIL/uL — ABNORMAL LOW (ref 4.22–5.81)
RBC.: 2.46 MIL/uL — ABNORMAL LOW (ref 4.22–5.81)
RBC.: 2.52 MIL/uL — ABNORMAL LOW (ref 4.22–5.81)
Retic Count, Absolute: 177.1 10*3/uL (ref 19.0–186.0)
Retic Count, Absolute: 268.7 10*3/uL — ABNORMAL HIGH (ref 19.0–186.0)
Retic Count, Absolute: 289.1 10*3/uL — ABNORMAL HIGH (ref 19.0–186.0)
Retic Count, Absolute: 339.5 10*3/uL — ABNORMAL HIGH (ref 19.0–186.0)
Retic Count, Absolute: 345.1 10*3/uL — ABNORMAL HIGH (ref 19.0–186.0)
Retic Ct Pct: 12.2 % — ABNORMAL HIGH (ref 0.4–3.1)
Retic Ct Pct: 13.3 % — ABNORMAL HIGH (ref 0.4–3.1)
Retic Ct Pct: 13.8 % — ABNORMAL HIGH (ref 0.4–3.1)
Retic Ct Pct: 14.5 % — ABNORMAL HIGH (ref 0.4–3.1)
Retic Ct Pct: 8.6 % — ABNORMAL HIGH (ref 0.4–3.1)

## 2010-04-14 LAB — TYPE AND SCREEN
Antibody Screen: NEGATIVE
Antibody Screen: NEGATIVE

## 2010-04-14 LAB — COMPREHENSIVE METABOLIC PANEL
ALT: 11 U/L (ref 0–53)
ALT: 20 U/L (ref 0–53)
ALT: 23 U/L (ref 0–53)
AST: 24 U/L (ref 0–37)
AST: 28 U/L (ref 0–37)
AST: 32 U/L (ref 0–37)
AST: 48 U/L — ABNORMAL HIGH (ref 0–37)
AST: 60 U/L — ABNORMAL HIGH (ref 0–37)
Albumin: 3.8 g/dL (ref 3.5–5.2)
Albumin: 4 g/dL (ref 3.5–5.2)
Albumin: 4.2 g/dL (ref 3.5–5.2)
Albumin: 4.3 g/dL (ref 3.5–5.2)
Alkaline Phosphatase: 116 U/L (ref 39–117)
Alkaline Phosphatase: 123 U/L — ABNORMAL HIGH (ref 39–117)
Alkaline Phosphatase: 74 U/L (ref 39–117)
Alkaline Phosphatase: 93 U/L (ref 39–117)
BUN: 5 mg/dL — ABNORMAL LOW (ref 6–23)
BUN: 5 mg/dL — ABNORMAL LOW (ref 6–23)
BUN: 7 mg/dL (ref 6–23)
BUN: 8 mg/dL (ref 6–23)
BUN: 8 mg/dL (ref 6–23)
CO2: 20 mEq/L (ref 19–32)
CO2: 21 mEq/L (ref 19–32)
CO2: 21 mEq/L (ref 19–32)
CO2: 22 mEq/L (ref 19–32)
CO2: 26 mEq/L (ref 19–32)
Calcium: 8.7 mg/dL (ref 8.4–10.5)
Calcium: 9 mg/dL (ref 8.4–10.5)
Calcium: 9.1 mg/dL (ref 8.4–10.5)
Calcium: 9.3 mg/dL (ref 8.4–10.5)
Chloride: 105 mEq/L (ref 96–112)
Chloride: 107 mEq/L (ref 96–112)
Chloride: 110 mEq/L (ref 96–112)
Chloride: 111 mEq/L (ref 96–112)
Chloride: 111 mEq/L (ref 96–112)
Chloride: 112 mEq/L (ref 96–112)
Chloride: 112 mEq/L (ref 96–112)
Creatinine, Ser: 0.51 mg/dL (ref 0.4–1.5)
Creatinine, Ser: 0.51 mg/dL (ref 0.4–1.5)
Creatinine, Ser: 0.54 mg/dL (ref 0.4–1.5)
Creatinine, Ser: 0.6 mg/dL (ref 0.4–1.5)
Creatinine, Ser: 0.76 mg/dL (ref 0.4–1.5)
GFR calc Af Amer: >60 mL/min (ref >60)
GFR calc Af Amer: >60 mL/min (ref >60)
GFR calc Af Amer: >60 mL/min (ref >60)
GFR calc Af Amer: >60 mL/min (ref >60)
GFR calc Af Amer: >60 mL/min (ref >60)
GFR calc non Af Amer: >60 mL/min (ref >60)
GFR calc non Af Amer: >60 mL/min (ref >60)
GFR calc non Af Amer: >60 mL/min (ref >60)
GFR calc non Af Amer: >60 mL/min (ref >60)
GFR calc non Af Amer: >60 mL/min (ref >60)
GFR calc non Af Amer: >60 mL/min (ref >60)
Glucose, Bld: 86 mg/dL (ref 70–99)
Glucose, Bld: 89 mg/dL (ref 70–99)
Glucose, Bld: 90 mg/dL (ref 70–99)
Potassium: 3.4 mEq/L — ABNORMAL LOW (ref 3.5–5.1)
Potassium: 4.3 mEq/L (ref 3.5–5.1)
Potassium: 4.3 mEq/L (ref 3.5–5.1)
Sodium: 138 mEq/L (ref 135–145)
Sodium: 140 mEq/L (ref 135–145)
Sodium: 141 mEq/L (ref 135–145)
Total Bilirubin: 3 mg/dL — ABNORMAL HIGH (ref 0.3–1.2)
Total Bilirubin: 3.2 mg/dL — ABNORMAL HIGH (ref 0.3–1.2)
Total Bilirubin: 3.7 mg/dL — ABNORMAL HIGH (ref 0.3–1.2)
Total Bilirubin: 4.1 mg/dL — ABNORMAL HIGH (ref 0.3–1.2)
Total Bilirubin: 4.6 mg/dL — ABNORMAL HIGH (ref 0.3–1.2)
Total Bilirubin: 5.3 mg/dL — ABNORMAL HIGH (ref 0.3–1.2)
Total Protein: 6.7 g/dL (ref 6.0–8.3)
Total Protein: 7 g/dL (ref 6.0–8.3)
Total Protein: 7.3 g/dL (ref 6.0–8.3)

## 2010-04-14 LAB — BASIC METABOLIC PANEL
BUN: 4 mg/dL — ABNORMAL LOW (ref 6–23)
BUN: 4 mg/dL — ABNORMAL LOW (ref 6–23)
CO2: 22 mEq/L (ref 19–32)
CO2: 23 mEq/L (ref 19–32)
CO2: 24 mEq/L (ref 19–32)
Calcium: 8.5 mg/dL (ref 8.4–10.5)
Calcium: 8.5 mg/dL (ref 8.4–10.5)
Calcium: 8.9 mg/dL (ref 8.4–10.5)
Calcium: 8.9 mg/dL (ref 8.4–10.5)
Calcium: 9.2 mg/dL (ref 8.4–10.5)
Chloride: 109 mEq/L (ref 96–112)
Chloride: 113 mEq/L — ABNORMAL HIGH (ref 96–112)
Creatinine, Ser: 0.52 mg/dL (ref 0.4–1.5)
Creatinine, Ser: 0.53 mg/dL (ref 0.4–1.5)
Creatinine, Ser: 0.55 mg/dL (ref 0.4–1.5)
Creatinine, Ser: 0.55 mg/dL (ref 0.4–1.5)
Creatinine, Ser: 0.58 mg/dL (ref 0.4–1.5)
GFR calc Af Amer: >60 mL/min (ref >60)
GFR calc Af Amer: >60 mL/min (ref >60)
GFR calc Af Amer: >60 mL/min (ref >60)
GFR calc Af Amer: >60 mL/min (ref >60)
GFR calc non Af Amer: >60 mL/min (ref >60)
GFR calc non Af Amer: >60 mL/min (ref >60)
GFR calc non Af Amer: >60 mL/min (ref >60)
GFR calc non Af Amer: >60 mL/min (ref >60)
GFR calc non Af Amer: >60 mL/min (ref >60)
Glucose, Bld: 91 mg/dL (ref 70–99)
Glucose, Bld: 98 mg/dL (ref 70–99)
Potassium: 4.3 mEq/L (ref 3.5–5.1)
Sodium: 138 mEq/L (ref 135–145)
Sodium: 140 mEq/L (ref 135–145)
Sodium: 141 mEq/L (ref 135–145)

## 2010-04-14 LAB — URINE CULTURE
Colony Count: NO GROWTH
Culture  Setup Time: 201110281638
Culture: NO GROWTH

## 2010-04-14 LAB — COMPREHENSIVE METABOLIC PANEL WITH GFR
ALT: 27 U/L (ref 0–53)
AST: 36 U/L (ref 0–37)
Albumin: 4.2 g/dL (ref 3.5–5.2)
Alkaline Phosphatase: 97 U/L (ref 39–117)
BUN: 13 mg/dL (ref 6–23)
CO2: 22 meq/L (ref 19–32)
Calcium: 8.9 mg/dL (ref 8.4–10.5)
Chloride: 108 meq/L (ref 96–112)
Creatinine, Ser: 0.68 mg/dL (ref 0.4–1.5)
GFR calc non Af Amer: >60 mL/min
Glucose, Bld: 101 mg/dL — ABNORMAL HIGH (ref 70–99)
Potassium: 4.3 meq/L (ref 3.5–5.1)
Sodium: 137 meq/L (ref 135–145)
Total Bilirubin: 4.3 mg/dL — ABNORMAL HIGH (ref 0.3–1.2)
Total Protein: 7.5 g/dL (ref 6.0–8.3)

## 2010-04-14 LAB — PREPARE RBC (CROSSMATCH)

## 2010-04-14 LAB — DIFFERENTIAL
Basophils Absolute: 0.1 10*3/uL (ref 0.0–0.1)
Basophils Absolute: 0.2 10*3/uL — ABNORMAL HIGH (ref 0.0–0.1)
Basophils Relative: 1 % (ref 0–1)
Basophils Relative: 1 % (ref 0–1)
Eosinophils Absolute: 0.4 10*3/uL (ref 0.0–0.7)
Eosinophils Absolute: 0.5 10*3/uL (ref 0.0–0.7)
Eosinophils Absolute: 0.5 10*3/uL (ref 0.0–0.7)
Eosinophils Absolute: 0.8 10*3/uL — ABNORMAL HIGH (ref 0.0–0.7)
Eosinophils Relative: 3 % (ref 0–5)
Lymphocytes Relative: 14 % (ref 12–46)
Lymphocytes Relative: 16 % (ref 12–46)
Lymphs Abs: 2.2 10*3/uL (ref 0.7–4.0)
Lymphs Abs: 3 10*3/uL (ref 0.7–4.0)
Monocytes Absolute: 2.5 10*3/uL — ABNORMAL HIGH (ref 0.1–1.0)
Monocytes Relative: 11 % (ref 3–12)
Monocytes Relative: 12 % (ref 3–12)
Neutro Abs: 12.5 10*3/uL — ABNORMAL HIGH (ref 1.7–7.7)
Neutro Abs: 12.8 10*3/uL — ABNORMAL HIGH (ref 1.7–7.7)
Neutrophils Relative %: 68 % (ref 43–77)
Neutrophils Relative %: 68 % (ref 43–77)
Neutrophils Relative %: 71 % (ref 43–77)

## 2010-04-14 LAB — LIPID PANEL: VLDL: 15 mg/dL (ref 0–40)

## 2010-04-14 LAB — URINALYSIS, MICROSCOPIC ONLY
Bilirubin Urine: NEGATIVE
Glucose, UA: NEGATIVE mg/dL
Hgb urine dipstick: NEGATIVE
Ketones, ur: NEGATIVE mg/dL
Nitrite: NEGATIVE
Specific Gravity, Urine: 1.008 (ref 1.005–1.030)
pH: 7 (ref 5.0–8.0)

## 2010-04-14 LAB — CARDIAC PANEL(CRET KIN+CKTOT+MB+TROPI)
CK, MB: 1.5 ng/mL (ref 0.3–4.0)
Relative Index: INVALID (ref 0.0–2.5)
Relative Index: INVALID (ref 0.0–2.5)
Total CK: 26 U/L (ref 7–232)
Total CK: 34 U/L (ref 7–232)
Total CK: 42 U/L (ref 7–232)
Troponin I: 0.15 ng/mL — ABNORMAL HIGH (ref 0.00–0.06)

## 2010-04-14 LAB — URINALYSIS, ROUTINE W REFLEX MICROSCOPIC
Ketones, ur: NEGATIVE mg/dL
Nitrite: NEGATIVE
Protein, ur: NEGATIVE mg/dL
Urobilinogen, UA: 1 mg/dL (ref 0.0–1.0)

## 2010-04-14 LAB — D-DIMER, QUANTITATIVE: D-Dimer, Quant: 1.38 ug/mL-FEU — ABNORMAL HIGH (ref 0.00–0.48)

## 2010-04-14 LAB — CROSSMATCH
ABO/RH(D): O POS
ABO/RH(D): O POS
Antibody Screen: NEGATIVE
Antibody Screen: NEGATIVE

## 2010-04-14 LAB — POCT CARDIAC MARKERS
CKMB, poc: <1 ng/mL — ABNORMAL LOW (ref 1.0–8.0)
Myoglobin, poc: 29.7 ng/mL (ref 12–200)

## 2010-04-14 LAB — MRSA PCR SCREENING
MRSA by PCR: NEGATIVE
MRSA by PCR: NEGATIVE

## 2010-04-14 LAB — CORTISOL-AM, BLOOD: Cortisol - AM: 8.2 ug/dL (ref 4.3–22.4)

## 2010-04-14 LAB — ANGIOTENSIN CONVERTING ENZYME: Angiotensin-Converting Enzyme: 17 U/L (ref 8–52)

## 2010-04-14 LAB — TSH: TSH: 0.656 u[IU]/mL (ref 0.350–4.500)

## 2010-04-14 LAB — CK TOTAL AND CKMB (NOT AT ARMC): Relative Index: 1.4 (ref 0.0–2.5)

## 2010-04-14 NOTE — H&P (Signed)
NAMEAUDY, [name]             ACCOUNT NO.:  [id number]  MEDICAL RECORD NO.:  [id number]           PATIENT TYPE:  E  LOCATION:  WLED                         FACILITY:  [facility]  PHYSICIAN:  Thad Ranger, MD       DATE OF BIRTH:  [birth year]  DATE OF ADMISSION:  03/25/2010 DATE OF DISCHARGE:                             HISTORY & PHYSICAL   PRIMARY CARE PHYSICIAN:  Lorelle Formosa, MD  CHIEF COMPLAINT:  Pain in ribs and legs.  HISTORY OF PRESENT ILLNESS:  [name] is a 30 year old male with known history of sickle cell anemia, presented to the [facility] Emergency Room with pain in the ribs and the legs typical of his sickle cell crisis.  He describes the pain as sharp and constant, 7/10 to 8/10 intensity with some nausea and mild shortness of breath.  He states that he is not having any fevers or chills or any productive cough.  No vomiting, abdominal pain, or any diarrhea, constipation.  He states that his appetite is okay.  He states he ran out of this medications in the last 2 weeks, which most likely caused to his sickle cell crisis.  PAST MEDICAL HISTORY: 1. Sickle cell anemia. 2. History of pulmonary embolus.  Patient says that it was 4 years ago     and he is off Coumadin. 3. History of avascular necrosis of left hip. 4. History of avascular necrosis of left shoulder. 5. Chronic leukocytosis and thrombocytosis. 6. History of cholecystectomy. 7. History of umbilical hernia repair. 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  SOCIAL HISTORY:  Patient states that he smokes cigars occasionally and occasionally drinks on the weekends.  Last drink was 3 weeks ago. Denies any drug abuse.  ALLERGIES:  MORPHINE.  MEDICATIONS:  Medications prior to admission:  He states that he ran out of all his meds 2 weeks ago; however, he is on; 1. OxyContin CR 40 mg p.o. b.i.d. 2. Klor-Con 20 mEq p.o. daily. 3. Ibuprofen 800 mg p.o. every 8 hours as needed for pain. 4. Hydroxyurea 500  mg p.o. b.i.d. 5. Oral Dilaudid 4 mg p.o. every 4 hours as needed. 6. Folic acid 1 mg twice daily. 7. Benadryl 50 mg p.o. daily as needed. 8. Ambien 10 mg p.o. daily at bedtime as needed.  PHYSICAL EXAMINATION:  VITAL SIGNS:  Blood pressure 137/83, pulse rate 77, respiratory rate 18, temperature 98.4. GENERAL:  Patient is alert, awake, and oriented x3, not in any acute distress. HEENT:  Anicteric sclerae.  Pink conjunctivae.  Pupils reactive to light and accommodation.  EOMI. Neck:  Supple.  No lymphopathy.  No JVD. CVS:  S1, S2 clear.  Regular rate and rhythm. CHEST:  Clear to auscultation bilaterally. ABDOMEN:  Soft, nontender, nondistended.  Normal bowel sounds. EXTREMITIES:  No cyanosis, clubbing or edema on any joint effusion noted. NEUROLOGIC:  No focal neurological deficits noted.  LABORATORY DATA:  White count 13.8, hemoglobin 9.0, hematocrit 27.4, platelets 636, neutrophils 65%, retic count 6.5% with absolute reticulocytes 188.5.  UA negative for UTI.  CBC yesterday in the emergency room had shown white count of 18.6.  RADIOLOGICAL  DATA:  Chest x-ray two-view, mild cardiac enlargement with basilar interstitial changes.  No acute changes since prior study.  IMPRESSION AND PLAN:  Mr. Castrejon is a 30 year old male presenting with typical sickle cell crisis: 1. Acute sickle cell crisis:  Patient will be admitted and placed on     intravenous fluids, oxygen, hydroxyurea and folic acid.  He will be     treated with pain medications.  Patient was counseled strongly on     medication compliance. 2. Leukocytosis, which is actually improving from the labs done     yesterday.  Chest x-ray is negative for any pneumonia.  He is     afebrile.  Hold off on any antibiotics. 3. Sickle cell anemia:  Currently H and H stable.  We will continue to     monitor closely. 4. Lovenox for deep venous thrombosis prophylaxis.     Thad Ranger, MD     RR/MEDQ  D:  03/25/2010  T:   03/25/2010  Job:  [id number]  cc:   Lorelle Formosa, M.D. Fax: [phone]  Electronically Signed by RIPUDEEP RAI  on 04/14/2010 07:52:52 AM

## 2010-04-15 LAB — CBC
HCT: 21.2 % — ABNORMAL LOW (ref 39.0–52.0)
HCT: 22.6 % — ABNORMAL LOW (ref 39.0–52.0)
HCT: 22.6 % — ABNORMAL LOW (ref 39.0–52.0)
HCT: 22.6 % — ABNORMAL LOW (ref 39.0–52.0)
HCT: 22.8 % — ABNORMAL LOW (ref 39.0–52.0)
HCT: 22.8 % — ABNORMAL LOW (ref 39.0–52.0)
HCT: 23.1 % — ABNORMAL LOW (ref 39.0–52.0)
HCT: 23.1 % — ABNORMAL LOW (ref 39.0–52.0)
HCT: 23.4 % — ABNORMAL LOW (ref 39.0–52.0)
Hemoglobin: 6.3 g/dL — CL (ref 13.0–17.0)
Hemoglobin: 6.7 g/dL — CL (ref 13.0–17.0)
Hemoglobin: 6.8 g/dL — CL (ref 13.0–17.0)
Hemoglobin: 7.1 g/dL — ABNORMAL LOW (ref 13.0–17.0)
Hemoglobin: 7.2 g/dL — ABNORMAL LOW (ref 13.0–17.0)
Hemoglobin: 7.7 g/dL — ABNORMAL LOW (ref 13.0–17.0)
Hemoglobin: 7.8 g/dL — ABNORMAL LOW (ref 13.0–17.0)
Hemoglobin: 7.8 g/dL — ABNORMAL LOW (ref 13.0–17.0)
MCH: 32 pg (ref 26.0–34.0)
MCH: 32.9 pg (ref 26.0–34.0)
MCH: 33.1 pg (ref 26.0–34.0)
MCH: 33.6 pg (ref 26.0–34.0)
MCH: 33.7 pg (ref 26.0–34.0)
MCH: 34.4 pg — ABNORMAL HIGH (ref 26.0–34.0)
MCHC: 32.8 g/dL (ref 30.0–36.0)
MCHC: 33.7 g/dL (ref 30.0–36.0)
MCHC: 34 g/dL (ref 30.0–36.0)
MCV: 100.2 fL — ABNORMAL HIGH (ref 78.0–100.0)
MCV: 94.3 fL (ref 78.0–100.0)
MCV: 96.7 fL (ref 78.0–100.0)
MCV: 96.8 fL (ref 78.0–100.0)
MCV: 97.7 fL (ref 78.0–100.0)
MCV: 98.3 fL (ref 78.0–100.0)
MCV: 98.7 fL (ref 78.0–100.0)
Platelets: 299 10*3/uL (ref 150–400)
Platelets: 447 10*3/uL — ABNORMAL HIGH (ref 150–400)
Platelets: 509 10*3/uL — ABNORMAL HIGH (ref 150–400)
Platelets: 552 10*3/uL — ABNORMAL HIGH (ref 150–400)
Platelets: 569 10*3/uL — ABNORMAL HIGH (ref 150–400)
Platelets: 62 10*3/uL — ABNORMAL LOW (ref 150–400)
RBC: 1.89 MIL/uL — ABNORMAL LOW (ref 4.22–5.81)
RBC: 2.04 MIL/uL — ABNORMAL LOW (ref 4.22–5.81)
RBC: 2.06 MIL/uL — ABNORMAL LOW (ref 4.22–5.81)
RBC: 2.15 MIL/uL — ABNORMAL LOW (ref 4.22–5.81)
RBC: 2.22 MIL/uL — ABNORMAL LOW (ref 4.22–5.81)
RBC: 2.3 MIL/uL — ABNORMAL LOW (ref 4.22–5.81)
RBC: 2.33 MIL/uL — ABNORMAL LOW (ref 4.22–5.81)
RBC: 2.34 MIL/uL — ABNORMAL LOW (ref 4.22–5.81)
RBC: 2.35 MIL/uL — ABNORMAL LOW (ref 4.22–5.81)
RBC: 2.4 MIL/uL — ABNORMAL LOW (ref 4.22–5.81)
RDW: 17.2 % — ABNORMAL HIGH (ref 11.5–15.5)
RDW: 18.6 % — ABNORMAL HIGH (ref 11.5–15.5)
RDW: 20.1 % — ABNORMAL HIGH (ref 11.5–15.5)
WBC: 11.7 10*3/uL — ABNORMAL HIGH (ref 4.0–10.5)
WBC: 13.8 10*3/uL — ABNORMAL HIGH (ref 4.0–10.5)
WBC: 13.9 10*3/uL — ABNORMAL HIGH (ref 4.0–10.5)
WBC: 14.5 10*3/uL — ABNORMAL HIGH (ref 4.0–10.5)
WBC: 17.2 10*3/uL — ABNORMAL HIGH (ref 4.0–10.5)
WBC: 17.4 10*3/uL — ABNORMAL HIGH (ref 4.0–10.5)
WBC: 24.5 10*3/uL — ABNORMAL HIGH (ref 4.0–10.5)
WBC: 26.1 10*3/uL — ABNORMAL HIGH (ref 4.0–10.5)
WBC: 33.4 10*3/uL — ABNORMAL HIGH (ref 4.0–10.5)

## 2010-04-15 LAB — CROSSMATCH: ABO/RH(D): O POS

## 2010-04-15 LAB — COMPREHENSIVE METABOLIC PANEL
ALT: 27 U/L (ref 0–53)
AST: 21 U/L (ref 0–37)
AST: 22 U/L (ref 0–37)
AST: 38 U/L — ABNORMAL HIGH (ref 0–37)
AST: 52 U/L — ABNORMAL HIGH (ref 0–37)
Albumin: 3.8 g/dL (ref 3.5–5.2)
Albumin: 4.1 g/dL (ref 3.5–5.2)
Albumin: 4.7 g/dL (ref 3.5–5.2)
Albumin: 4.7 g/dL (ref 3.5–5.2)
Alkaline Phosphatase: 102 U/L (ref 39–117)
Alkaline Phosphatase: 108 U/L (ref 39–117)
Alkaline Phosphatase: 82 U/L (ref 39–117)
BUN: 3 mg/dL — ABNORMAL LOW (ref 6–23)
BUN: 6 mg/dL (ref 6–23)
BUN: 7 mg/dL (ref 6–23)
BUN: 9 mg/dL (ref 6–23)
CO2: 19 mEq/L (ref 19–32)
CO2: 27 mEq/L (ref 19–32)
Calcium: 8.9 mg/dL (ref 8.4–10.5)
Calcium: 9.1 mg/dL (ref 8.4–10.5)
Chloride: 110 mEq/L (ref 96–112)
Chloride: 111 mEq/L (ref 96–112)
Chloride: 112 mEq/L (ref 96–112)
Chloride: 114 mEq/L — ABNORMAL HIGH (ref 96–112)
Creatinine, Ser: 0.52 mg/dL (ref 0.4–1.5)
Creatinine, Ser: 0.58 mg/dL (ref 0.4–1.5)
Creatinine, Ser: 0.62 mg/dL (ref 0.4–1.5)
Creatinine, Ser: 0.93 mg/dL (ref 0.4–1.5)
GFR calc Af Amer: >60 mL/min (ref >60)
GFR calc Af Amer: >60 mL/min (ref >60)
GFR calc Af Amer: >60 mL/min (ref >60)
GFR calc Af Amer: >60 mL/min (ref >60)
GFR calc non Af Amer: >60 mL/min (ref >60)
GFR calc non Af Amer: >60 mL/min (ref >60)
Glucose, Bld: 104 mg/dL — ABNORMAL HIGH (ref 70–99)
Glucose, Bld: 111 mg/dL — ABNORMAL HIGH (ref 70–99)
Glucose, Bld: 112 mg/dL — ABNORMAL HIGH (ref 70–99)
Potassium: 3 mEq/L — ABNORMAL LOW (ref 3.5–5.1)
Potassium: 3.8 mEq/L (ref 3.5–5.1)
Potassium: 4.2 mEq/L (ref 3.5–5.1)
Sodium: 140 mEq/L (ref 135–145)
Sodium: 142 mEq/L (ref 135–145)
Total Bilirubin: 2.7 mg/dL — ABNORMAL HIGH (ref 0.3–1.2)
Total Bilirubin: 3.4 mg/dL — ABNORMAL HIGH (ref 0.3–1.2)
Total Bilirubin: 3.7 mg/dL — ABNORMAL HIGH (ref 0.3–1.2)
Total Bilirubin: 5.2 mg/dL — ABNORMAL HIGH (ref 0.3–1.2)
Total Protein: 7.2 g/dL (ref 6.0–8.3)
Total Protein: 8.4 g/dL — ABNORMAL HIGH (ref 6.0–8.3)

## 2010-04-15 LAB — DIFFERENTIAL
Basophils Absolute: 0 10*3/uL (ref 0.0–0.1)
Basophils Absolute: 0 10*3/uL (ref 0.0–0.1)
Basophils Absolute: 0.1 10*3/uL (ref 0.0–0.1)
Basophils Relative: 0 % (ref 0–1)
Basophils Relative: 1 % (ref 0–1)
Basophils Relative: 1 % (ref 0–1)
Eosinophils Absolute: 0.3 10*3/uL (ref 0.0–0.7)
Eosinophils Absolute: 0.3 10*3/uL (ref 0.0–0.7)
Eosinophils Absolute: 1 10*3/uL — ABNORMAL HIGH (ref 0.0–0.7)
Eosinophils Relative: 4 % (ref 0–5)
Eosinophils Relative: 6 % — ABNORMAL HIGH (ref 0–5)
Lymphocytes Relative: 24 % (ref 12–46)
Lymphocytes Relative: 5 % — ABNORMAL LOW (ref 12–46)
Lymphocytes Relative: 8 % — ABNORMAL LOW (ref 12–46)
Lymphs Abs: 1.3 10*3/uL (ref 0.7–4.0)
Lymphs Abs: 2.5 10*3/uL (ref 0.7–4.0)
Monocytes Absolute: 1.3 10*3/uL — ABNORMAL HIGH (ref 0.1–1.0)
Monocytes Relative: 12 % (ref 3–12)
Monocytes Relative: 7 % (ref 3–12)
Monocytes Relative: 9 % (ref 3–12)
Neutro Abs: 13.2 10*3/uL — ABNORMAL HIGH (ref 1.7–7.7)
Neutro Abs: 7.5 10*3/uL (ref 1.7–7.7)
Neutrophils Relative %: 57 % (ref 43–77)
Neutrophils Relative %: 89 % — ABNORMAL HIGH (ref 43–77)

## 2010-04-15 LAB — BASIC METABOLIC PANEL
BUN: 4 mg/dL — ABNORMAL LOW (ref 6–23)
BUN: 5 mg/dL — ABNORMAL LOW (ref 6–23)
BUN: 6 mg/dL (ref 6–23)
CO2: 19 mEq/L (ref 19–32)
Calcium: 8.5 mg/dL (ref 8.4–10.5)
Calcium: 8.7 mg/dL (ref 8.4–10.5)
Calcium: 8.8 mg/dL (ref 8.4–10.5)
Calcium: 8.9 mg/dL (ref 8.4–10.5)
Calcium: 9.3 mg/dL (ref 8.4–10.5)
Chloride: 112 mEq/L (ref 96–112)
Chloride: 104 mEq/L (ref 96–112)
Chloride: 104 mEq/L (ref 96–112)
Creatinine, Ser: 0.57 mg/dL (ref 0.4–1.5)
Creatinine, Ser: 0.64 mg/dL (ref 0.4–1.5)
GFR calc Af Amer: >60 mL/min (ref >60)
GFR calc Af Amer: >60 mL/min (ref >60)
GFR calc Af Amer: >60 mL/min (ref >60)
GFR calc Af Amer: >60 mL/min (ref >60)
GFR calc non Af Amer: >60 mL/min (ref >60)
GFR calc non Af Amer: >60 mL/min (ref >60)
GFR calc non Af Amer: >60 mL/min (ref >60)
GFR calc non Af Amer: >60 mL/min (ref >60)
GFR calc non Af Amer: >60 mL/min (ref >60)
GFR calc non Af Amer: >60 mL/min (ref >60)
Glucose, Bld: 107 mg/dL — ABNORMAL HIGH (ref 70–99)
Glucose, Bld: 114 mg/dL — ABNORMAL HIGH (ref 70–99)
Glucose, Bld: 99 mg/dL (ref 70–99)
Potassium: 3.1 mEq/L — ABNORMAL LOW (ref 3.5–5.1)
Potassium: 3.5 mEq/L (ref 3.5–5.1)
Potassium: 3.9 mEq/L (ref 3.5–5.1)
Potassium: 4 mEq/L (ref 3.5–5.1)
Sodium: 140 mEq/L (ref 135–145)
Sodium: 140 mEq/L (ref 135–145)
Sodium: 142 mEq/L (ref 135–145)
Sodium: 143 mEq/L (ref 135–145)
Sodium: 143 mEq/L (ref 135–145)

## 2010-04-15 LAB — URINE MICROSCOPIC-ADD ON

## 2010-04-15 LAB — RETICULOCYTES
RBC.: 2.25 MIL/uL — ABNORMAL LOW (ref 4.22–5.81)
RBC.: 2.29 MIL/uL — ABNORMAL LOW (ref 4.22–5.81)
RBC.: 2.35 MIL/uL — ABNORMAL LOW (ref 4.22–5.81)
Retic Count, Absolute: 103.1 10*3/uL (ref 19.0–186.0)
Retic Count, Absolute: 271.4 10*3/uL — ABNORMAL HIGH (ref 19.0–186.0)
Retic Count, Absolute: 305.9 10*3/uL — ABNORMAL HIGH (ref 19.0–186.0)
Retic Count, Absolute: 332.1 10*3/uL — ABNORMAL HIGH (ref 19.0–186.0)
Retic Count, Absolute: 335.3 10*3/uL — ABNORMAL HIGH (ref 19.0–186.0)
Retic Ct Pct: 11.7 % — ABNORMAL HIGH (ref 0.4–3.1)
Retic Ct Pct: 14.9 % — ABNORMAL HIGH (ref 0.4–3.1)
Retic Ct Pct: 4.5 % — ABNORMAL HIGH (ref 0.4–3.1)
Retic Ct Pct: 6.9 % — ABNORMAL HIGH (ref 0.4–3.1)

## 2010-04-15 LAB — TROPONIN I: Troponin I: 0.03 ng/mL (ref 0.00–0.06)

## 2010-04-15 LAB — URINALYSIS, ROUTINE W REFLEX MICROSCOPIC
Bilirubin Urine: NEGATIVE
Ketones, ur: NEGATIVE mg/dL
Leukocytes, UA: NEGATIVE
Nitrite: NEGATIVE
Nitrite: NEGATIVE
Protein, ur: NEGATIVE mg/dL
Specific Gravity, Urine: 1.01 (ref 1.005–1.030)
Urobilinogen, UA: 1 mg/dL (ref 0.0–1.0)
Urobilinogen, UA: 4 mg/dL — ABNORMAL HIGH (ref 0.0–1.0)
pH: 6 (ref 5.0–8.0)

## 2010-04-15 LAB — SAMPLE TO BLOOD BANK

## 2010-04-15 LAB — CARDIAC PANEL(CRET KIN+CKTOT+MB+TROPI)
CK, MB: 1.3 ng/mL (ref 0.3–4.0)
CK, MB: 1.4 ng/mL (ref 0.3–4.0)
Relative Index: INVALID (ref 0.0–2.5)
Relative Index: INVALID (ref 0.0–2.5)
Relative Index: INVALID (ref 0.0–2.5)
Total CK: 84 U/L (ref 7–232)
Troponin I: 0.01 ng/mL (ref 0.00–0.06)
Troponin I: 0.01 ng/mL (ref 0.00–0.06)

## 2010-04-15 LAB — GLUCOSE, CAPILLARY

## 2010-04-15 LAB — SURGICAL PCR SCREEN: MRSA, PCR: NEGATIVE

## 2010-04-15 LAB — RAPID URINE DRUG SCREEN, HOSP PERFORMED: Barbiturates: NOT DETECTED

## 2010-04-15 LAB — MAGNESIUM: Magnesium: 1.8 mg/dL (ref 1.5–2.5)

## 2010-04-15 LAB — URINE CULTURE
Culture  Setup Time: 201108250230
Culture: NO GROWTH

## 2010-04-15 LAB — MRSA PCR SCREENING: MRSA by PCR: NEGATIVE

## 2010-04-15 LAB — CK TOTAL AND CKMB (NOT AT ARMC)
CK, MB: 0.6 ng/mL (ref 0.3–4.0)
Relative Index: INVALID (ref 0.0–2.5)

## 2010-04-15 LAB — D-DIMER, QUANTITATIVE: D-Dimer, Quant: 0.91 ug/mL-FEU — ABNORMAL HIGH (ref 0.00–0.48)

## 2010-04-15 LAB — POTASSIUM: Potassium: 2.4 mEq/L — CL (ref 3.5–5.1)

## 2010-04-16 LAB — CBC
HCT: 24.5 % — ABNORMAL LOW (ref 39.0–52.0)
HCT: 26.6 % — ABNORMAL LOW (ref 39.0–52.0)
Hemoglobin: 8.1 g/dL — ABNORMAL LOW (ref 13.0–17.0)
Hemoglobin: 9.1 g/dL — ABNORMAL LOW (ref 13.0–17.0)
MCH: 32.3 pg (ref 26.0–34.0)
MCHC: 33.1 g/dL (ref 30.0–36.0)
MCV: 92 fL (ref 78.0–100.0)
RBC: 2.51 MIL/uL — ABNORMAL LOW (ref 4.22–5.81)
RDW: 17.7 % — ABNORMAL HIGH (ref 11.5–15.5)
WBC: 19 10*3/uL — ABNORMAL HIGH (ref 4.0–10.5)

## 2010-04-16 LAB — DIFFERENTIAL
Basophils Relative: 0 % (ref 0–1)
Eosinophils Relative: 6 % — ABNORMAL HIGH (ref 0–5)
Eosinophils Relative: 3 % (ref 0–5)
Lymphocytes Relative: 12 % (ref 12–46)
Lymphs Abs: 1.6 10*3/uL (ref 0.7–4.0)
Lymphs Abs: 2.3 10*3/uL (ref 0.7–4.0)
Monocytes Absolute: 1.3 10*3/uL — ABNORMAL HIGH (ref 0.1–1.0)
Monocytes Absolute: 2 10*3/uL — ABNORMAL HIGH (ref 0.1–1.0)
Monocytes Relative: 9 % (ref 3–12)
Monocytes Relative: 10 % (ref 3–12)
Neutro Abs: 14.1 10*3/uL — ABNORMAL HIGH (ref 1.7–7.7)
Neutrophils Relative %: 74 % (ref 43–77)

## 2010-04-16 LAB — BASIC METABOLIC PANEL
BUN: 2 mg/dL — ABNORMAL LOW (ref 6–23)
Chloride: 110 mEq/L (ref 96–112)
GFR calc non Af Amer: >60 mL/min (ref >60)
Potassium: 3.2 mEq/L — ABNORMAL LOW (ref 3.5–5.1)
Sodium: 141 mEq/L (ref 135–145)

## 2010-04-16 LAB — RETICULOCYTES: Retic Count, Absolute: 284.3 10*3/uL — ABNORMAL HIGH (ref 19.0–186.0)

## 2010-04-17 LAB — CBC
HCT: 24.2 % — ABNORMAL LOW (ref 39.0–52.0)
Hemoglobin: 8.1 g/dL — ABNORMAL LOW (ref 13.0–17.0)
MCV: 100.8 fL — ABNORMAL HIGH (ref 78.0–100.0)
RBC: 2.4 MIL/uL — ABNORMAL LOW (ref 4.22–5.81)
RDW: 21.1 % — ABNORMAL HIGH (ref 11.5–15.5)
WBC: 15.5 10*3/uL — ABNORMAL HIGH (ref 4.0–10.5)

## 2010-04-17 LAB — COMPREHENSIVE METABOLIC PANEL
ALT: 45 U/L (ref 0–53)
Alkaline Phosphatase: 128 U/L — ABNORMAL HIGH (ref 39–117)
BUN: 7 mg/dL (ref 6–23)
Chloride: 107 mEq/L (ref 96–112)
Glucose, Bld: 101 mg/dL — ABNORMAL HIGH (ref 70–99)
Potassium: 3.9 mEq/L (ref 3.5–5.1)
Sodium: 140 mEq/L (ref 135–145)
Total Bilirubin: 4 mg/dL — ABNORMAL HIGH (ref 0.3–1.2)
Total Protein: 7.7 g/dL (ref 6.0–8.3)

## 2010-04-18 LAB — CBC
HCT: 20.6 % — ABNORMAL LOW (ref 39.0–52.0)
HCT: 22 % — ABNORMAL LOW (ref 39.0–52.0)
HCT: 22.3 % — ABNORMAL LOW (ref 39.0–52.0)
HCT: 23.6 % — ABNORMAL LOW (ref 39.0–52.0)
Hemoglobin: 6.9 g/dL — CL (ref 13.0–17.0)
Hemoglobin: 7.6 g/dL — ABNORMAL LOW (ref 13.0–17.0)
Hemoglobin: 7.9 g/dL — ABNORMAL LOW (ref 13.0–17.0)
Hemoglobin: 8 g/dL — ABNORMAL LOW (ref 13.0–17.0)
Hemoglobin: 8 g/dL — ABNORMAL LOW (ref 13.0–17.0)
Hemoglobin: 9.1 g/dL — ABNORMAL LOW (ref 13.0–17.0)
MCH: 32.7 pg (ref 26.0–34.0)
MCH: 33.3 pg (ref 26.0–34.0)
MCH: 33.4 pg (ref 26.0–34.0)
MCH: 33.7 pg (ref 26.0–34.0)
MCH: 34.3 pg — ABNORMAL HIGH (ref 26.0–34.0)
MCH: 34.9 pg — ABNORMAL HIGH (ref 26.0–34.0)
MCHC: 33.8 g/dL (ref 30.0–36.0)
MCHC: 35.4 g/dL (ref 30.0–36.0)
MCHC: 35.6 g/dL (ref 30.0–36.0)
MCV: 100.9 fL — ABNORMAL HIGH (ref 78.0–100.0)
MCV: 102.3 fL — ABNORMAL HIGH (ref 78.0–100.0)
MCV: 96.3 fL (ref 78.0–100.0)
MCV: 99 fL (ref 78.0–100.0)
MCV: 96.7 fL (ref 78.0–100.0)
Platelets: 428 10*3/uL — ABNORMAL HIGH (ref 150–400)
Platelets: 428 10*3/uL — ABNORMAL HIGH (ref 150–400)
Platelets: 436 10*3/uL — ABNORMAL HIGH (ref 150–400)
Platelets: 468 10*3/uL — ABNORMAL HIGH (ref 150–400)
RBC: 2.4 MIL/uL — ABNORMAL LOW (ref 4.22–5.81)
RBC: 2.46 MIL/uL — ABNORMAL LOW (ref 4.22–5.81)
RBC: 2.81 MIL/uL — ABNORMAL LOW (ref 4.22–5.81)
RDW: 17.6 % — ABNORMAL HIGH (ref 11.5–15.5)
RDW: 22.5 % — ABNORMAL HIGH (ref 11.5–15.5)
RDW: 23.4 % — ABNORMAL HIGH (ref 11.5–15.5)
WBC: 14 10*3/uL — ABNORMAL HIGH (ref 4.0–10.5)
WBC: 16.2 10*3/uL — ABNORMAL HIGH (ref 4.0–10.5)
WBC: 18.5 10*3/uL — ABNORMAL HIGH (ref 4.0–10.5)
WBC: 15.3 10*3/uL — ABNORMAL HIGH (ref 4.0–10.5)

## 2010-04-18 LAB — COMPREHENSIVE METABOLIC PANEL
ALT: 17 U/L (ref 0–53)
AST: 17 U/L (ref 0–37)
AST: 19 U/L (ref 0–37)
AST: 21 U/L (ref 0–37)
AST: 45 U/L — ABNORMAL HIGH (ref 0–37)
Albumin: 3.7 g/dL (ref 3.5–5.2)
Albumin: 3.8 g/dL (ref 3.5–5.2)
Albumin: 4.1 g/dL (ref 3.5–5.2)
Albumin: 4.1 g/dL (ref 3.5–5.2)
Alkaline Phosphatase: 107 U/L (ref 39–117)
Alkaline Phosphatase: 120 U/L — ABNORMAL HIGH (ref 39–117)
Alkaline Phosphatase: 81 U/L (ref 39–117)
Alkaline Phosphatase: 89 U/L (ref 39–117)
BUN: 4 mg/dL — ABNORMAL LOW (ref 6–23)
BUN: 8 mg/dL (ref 6–23)
CO2: 22 mEq/L (ref 19–32)
CO2: 24 mEq/L (ref 19–32)
CO2: 24 mEq/L (ref 19–32)
CO2: 25 mEq/L (ref 19–32)
Calcium: 8.5 mg/dL (ref 8.4–10.5)
Calcium: 8.7 mg/dL (ref 8.4–10.5)
Calcium: 9 mg/dL (ref 8.4–10.5)
Chloride: 108 mEq/L (ref 96–112)
Chloride: 111 mEq/L (ref 96–112)
Creatinine, Ser: 0.57 mg/dL (ref 0.4–1.5)
Creatinine, Ser: 0.66 mg/dL (ref 0.4–1.5)
Creatinine, Ser: 0.69 mg/dL (ref 0.4–1.5)
Creatinine, Ser: 0.7 mg/dL (ref 0.4–1.5)
Creatinine, Ser: 0.76 mg/dL (ref 0.4–1.5)
GFR calc Af Amer: >60 mL/min (ref >60)
GFR calc Af Amer: >60 mL/min (ref >60)
GFR calc Af Amer: >60 mL/min (ref >60)
GFR calc Af Amer: >60 mL/min (ref >60)
GFR calc non Af Amer: >60 mL/min (ref >60)
GFR calc non Af Amer: >60 mL/min (ref >60)
GFR calc non Af Amer: >60 mL/min (ref >60)
GFR calc non Af Amer: >60 mL/min (ref >60)
Glucose, Bld: 107 mg/dL — ABNORMAL HIGH (ref 70–99)
Glucose, Bld: 95 mg/dL (ref 70–99)
Glucose, Bld: 98 mg/dL (ref 70–99)
Glucose, Bld: 98 mg/dL (ref 70–99)
Potassium: 3.1 mEq/L — ABNORMAL LOW (ref 3.5–5.1)
Potassium: 3.6 mEq/L (ref 3.5–5.1)
Potassium: 3.8 mEq/L (ref 3.5–5.1)
Sodium: 140 mEq/L (ref 135–145)
Total Bilirubin: 3.2 mg/dL — ABNORMAL HIGH (ref 0.3–1.2)
Total Bilirubin: 3.4 mg/dL — ABNORMAL HIGH (ref 0.3–1.2)
Total Bilirubin: 3.8 mg/dL — ABNORMAL HIGH (ref 0.3–1.2)
Total Bilirubin: 3.9 mg/dL — ABNORMAL HIGH (ref 0.3–1.2)
Total Bilirubin: 4.2 mg/dL — ABNORMAL HIGH (ref 0.3–1.2)
Total Protein: 7.2 g/dL (ref 6.0–8.3)
Total Protein: 7.4 g/dL (ref 6.0–8.3)

## 2010-04-18 LAB — BASIC METABOLIC PANEL
CO2: 23 mEq/L (ref 19–32)
Chloride: 106 mEq/L (ref 96–112)
Creatinine, Ser: 0.57 mg/dL (ref 0.4–1.5)
GFR calc Af Amer: >60 mL/min (ref >60)
Potassium: 3.3 mEq/L — ABNORMAL LOW (ref 3.5–5.1)
Sodium: 136 mEq/L (ref 135–145)

## 2010-04-18 LAB — URINALYSIS, ROUTINE W REFLEX MICROSCOPIC
Bilirubin Urine: NEGATIVE
Glucose, UA: NEGATIVE mg/dL
Hgb urine dipstick: NEGATIVE
Hgb urine dipstick: NEGATIVE
Ketones, ur: NEGATIVE mg/dL
Ketones, ur: NEGATIVE mg/dL
Nitrite: NEGATIVE
Protein, ur: 30 mg/dL — AB
Protein, ur: NEGATIVE mg/dL
Urobilinogen, UA: 0.2 mg/dL (ref 0.0–1.0)
Urobilinogen, UA: 1 mg/dL (ref 0.0–1.0)

## 2010-04-18 LAB — DIFFERENTIAL
Basophils Absolute: 0 10*3/uL (ref 0.0–0.1)
Basophils Absolute: 0.2 10*3/uL — ABNORMAL HIGH (ref 0.0–0.1)
Basophils Absolute: 0 10*3/uL (ref 0.0–0.1)
Basophils Relative: 0 % (ref 0–1)
Basophils Relative: 0 % (ref 0–1)
Eosinophils Absolute: 0.2 10*3/uL (ref 0.0–0.7)
Eosinophils Absolute: 0.5 10*3/uL (ref 0.0–0.7)
Eosinophils Relative: 1 % (ref 0–5)
Eosinophils Relative: 4 % (ref 0–5)
Eosinophils Relative: 2 % (ref 0–5)
Lymphocytes Relative: 15 % (ref 12–46)
Lymphocytes Relative: 11 % — ABNORMAL LOW (ref 12–46)
Lymphs Abs: 3 10*3/uL (ref 0.7–4.0)
Lymphs Abs: 3.2 10*3/uL (ref 0.7–4.0)
Monocytes Absolute: 0 10*3/uL — ABNORMAL LOW (ref 0.1–1.0)
Monocytes Absolute: 1.3 10*3/uL — ABNORMAL HIGH (ref 0.1–1.0)
Monocytes Absolute: 1.8 10*3/uL — ABNORMAL HIGH (ref 0.1–1.0)
Monocytes Relative: 8 % (ref 3–12)
Monocytes Relative: 9 % (ref 3–12)
Neutro Abs: 11.9 10*3/uL — ABNORMAL HIGH (ref 1.7–7.7)
Neutrophils Relative %: 84 % — ABNORMAL HIGH (ref 43–77)

## 2010-04-18 LAB — MAGNESIUM: Magnesium: 1.7 mg/dL (ref 1.5–2.5)

## 2010-04-18 LAB — RETICULOCYTES
RBC.: 2.39 MIL/uL — ABNORMAL LOW (ref 4.22–5.81)
RBC.: 2.44 MIL/uL — ABNORMAL LOW (ref 4.22–5.81)
RBC.: 2.83 MIL/uL — ABNORMAL LOW (ref 4.22–5.81)
Retic Count, Absolute: 230.3 10*3/uL — ABNORMAL HIGH (ref 19.0–186.0)
Retic Count, Absolute: 337 10*3/uL — ABNORMAL HIGH (ref 19.0–186.0)
Retic Count, Absolute: 147.2 10*3/uL (ref 19.0–186.0)
Retic Ct Pct: 12.4 % — ABNORMAL HIGH (ref 0.4–3.1)

## 2010-04-18 LAB — CROSSMATCH
ABO/RH(D): O POS
Antibody Screen: NEGATIVE

## 2010-04-18 LAB — URINE MICROSCOPIC-ADD ON

## 2010-04-18 LAB — URINE CULTURE
Colony Count: NO GROWTH
Culture: NO GROWTH

## 2010-04-19 LAB — URINALYSIS, ROUTINE W REFLEX MICROSCOPIC
Ketones, ur: NEGATIVE mg/dL
Nitrite: NEGATIVE
Protein, ur: NEGATIVE mg/dL
pH: 6 (ref 5.0–8.0)

## 2010-04-19 LAB — CROSSMATCH
ABO/RH(D): O POS
Antibody Screen: NEGATIVE

## 2010-04-19 LAB — COMPREHENSIVE METABOLIC PANEL
ALT: 15 U/L (ref 0–53)
AST: 22 U/L (ref 0–37)
Alkaline Phosphatase: 83 U/L (ref 39–117)
Alkaline Phosphatase: 84 U/L (ref 39–117)
BUN: 6 mg/dL (ref 6–23)
CO2: 26 mEq/L (ref 19–32)
Chloride: 107 mEq/L (ref 96–112)
Creatinine, Ser: 0.56 mg/dL (ref 0.4–1.5)
GFR calc Af Amer: >60 mL/min (ref >60)
GFR calc non Af Amer: >60 mL/min (ref >60)
GFR calc non Af Amer: >60 mL/min (ref >60)
Glucose, Bld: 86 mg/dL (ref 70–99)
Potassium: 3.5 mEq/L (ref 3.5–5.1)
Potassium: 3.8 mEq/L (ref 3.5–5.1)
Sodium: 140 mEq/L (ref 135–145)
Total Bilirubin: 4.1 mg/dL — ABNORMAL HIGH (ref 0.3–1.2)
Total Protein: 7.2 g/dL (ref 6.0–8.3)

## 2010-04-19 LAB — RETICULOCYTES
RBC.: 2.13 MIL/uL — ABNORMAL LOW (ref 4.22–5.81)
Retic Count, Absolute: 196 10*3/uL — ABNORMAL HIGH (ref 19.0–186.0)
Retic Count, Absolute: 257.7 10*3/uL — ABNORMAL HIGH (ref 19.0–186.0)

## 2010-04-19 LAB — DIFFERENTIAL
Basophils Absolute: 0 10*3/uL (ref 0.0–0.1)
Basophils Relative: 1 % (ref 0–1)
Eosinophils Absolute: 0.4 10*3/uL (ref 0.0–0.7)
Lymphocytes Relative: 21 % (ref 12–46)
Lymphocytes Relative: 9 % — ABNORMAL LOW (ref 12–46)
Lymphs Abs: 1.8 10*3/uL (ref 0.7–4.0)
Monocytes Absolute: 1.7 10*3/uL — ABNORMAL HIGH (ref 0.1–1.0)
Monocytes Relative: 8 % (ref 3–12)
Neutro Abs: 17.1 10*3/uL — ABNORMAL HIGH (ref 1.7–7.7)
Neutrophils Relative %: 74 % (ref 43–77)
Neutrophils Relative %: 81 % — ABNORMAL HIGH (ref 43–77)

## 2010-04-19 LAB — CBC
HCT: 22.2 % — ABNORMAL LOW (ref 39.0–52.0)
Hemoglobin: 6.8 g/dL — CL (ref 13.0–17.0)
Hemoglobin: 7.3 g/dL — ABNORMAL LOW (ref 13.0–17.0)
Hemoglobin: 7.4 g/dL — ABNORMAL LOW (ref 13.0–17.0)
MCHC: 33.5 g/dL (ref 30.0–36.0)
MCV: 101.6 fL — ABNORMAL HIGH (ref 78.0–100.0)
Platelets: 455 10*3/uL — ABNORMAL HIGH (ref 150–400)
RBC: 2.15 MIL/uL — ABNORMAL LOW (ref 4.22–5.81)
RBC: 2.5 MIL/uL — ABNORMAL LOW (ref 4.22–5.81)
RDW: 16.7 % — ABNORMAL HIGH (ref 11.5–15.5)
RDW: 17.4 % — ABNORMAL HIGH (ref 11.5–15.5)
WBC: 21 10*3/uL — ABNORMAL HIGH (ref 4.0–10.5)
WBC: 25.4 10*3/uL — ABNORMAL HIGH (ref 4.0–10.5)

## 2010-04-19 LAB — IRON AND TIBC
Iron: 162 ug/dL — ABNORMAL HIGH (ref 42–135)
UIBC: <55 ug/dL

## 2010-04-19 LAB — RAPID URINE DRUG SCREEN, HOSP PERFORMED
Cocaine: NOT DETECTED
Tetrahydrocannabinol: NOT DETECTED

## 2010-04-19 LAB — HEPATIC FUNCTION PANEL
AST: 21 U/L (ref 0–37)
Albumin: 3.8 g/dL (ref 3.5–5.2)
Total Protein: 7.1 g/dL (ref 6.0–8.3)

## 2010-04-19 LAB — BASIC METABOLIC PANEL
BUN: 4 mg/dL — ABNORMAL LOW (ref 6–23)
Creatinine, Ser: 0.52 mg/dL (ref 0.4–1.5)
GFR calc Af Amer: >60 mL/min (ref >60)
GFR calc non Af Amer: >60 mL/min (ref >60)
Potassium: 3.3 mEq/L — ABNORMAL LOW (ref 3.5–5.1)

## 2010-04-19 LAB — FERRITIN: Ferritin: 591 ng/mL — ABNORMAL HIGH (ref 22–322)

## 2010-04-20 LAB — COMPREHENSIVE METABOLIC PANEL
ALT: 35 U/L (ref 0–53)
ALT: 35 U/L (ref 0–53)
ALT: 30 U/L (ref 0–53)
AST: 34 U/L (ref 0–37)
AST: 35 U/L (ref 0–37)
AST: 35 U/L (ref 0–37)
Albumin: 4.1 g/dL (ref 3.5–5.2)
Albumin: 4.2 g/dL (ref 3.5–5.2)
Albumin: 4.3 g/dL (ref 3.5–5.2)
Alkaline Phosphatase: 140 U/L — ABNORMAL HIGH (ref 39–117)
Alkaline Phosphatase: 161 U/L — ABNORMAL HIGH (ref 39–117)
Alkaline Phosphatase: 116 U/L (ref 39–117)
BUN: 7 mg/dL (ref 6–23)
BUN: 8 mg/dL (ref 6–23)
BUN: 9 mg/dL (ref 6–23)
CO2: 25 mEq/L (ref 19–32)
CO2: 23 mEq/L (ref 19–32)
Calcium: 8.9 mg/dL (ref 8.4–10.5)
Calcium: 9.2 mg/dL (ref 8.4–10.5)
Chloride: 107 mEq/L (ref 96–112)
Chloride: 109 mEq/L (ref 96–112)
Chloride: 109 mEq/L (ref 96–112)
Creatinine, Ser: 0.54 mg/dL (ref 0.4–1.5)
Creatinine, Ser: 0.56 mg/dL (ref 0.4–1.5)
GFR calc Af Amer: >60 mL/min (ref >60)
GFR calc Af Amer: >60 mL/min (ref >60)
GFR calc Af Amer: >60 mL/min (ref >60)
GFR calc non Af Amer: >60 mL/min (ref >60)
GFR calc non Af Amer: >60 mL/min (ref >60)
Glucose, Bld: 104 mg/dL — ABNORMAL HIGH (ref 70–99)
Glucose, Bld: 99 mg/dL (ref 70–99)
Potassium: 3.7 mEq/L (ref 3.5–5.1)
Potassium: 4.2 mEq/L (ref 3.5–5.1)
Potassium: 3.5 mEq/L (ref 3.5–5.1)
Sodium: 139 mEq/L (ref 135–145)
Sodium: 139 mEq/L (ref 135–145)
Sodium: 140 mEq/L (ref 135–145)
Total Bilirubin: 3.4 mg/dL — ABNORMAL HIGH (ref 0.3–1.2)
Total Bilirubin: 5.8 mg/dL — ABNORMAL HIGH (ref 0.3–1.2)
Total Bilirubin: 5.3 mg/dL — ABNORMAL HIGH (ref 0.3–1.2)
Total Protein: 7.7 g/dL (ref 6.0–8.3)
Total Protein: 7.7 g/dL (ref 6.0–8.3)
Total Protein: 8 g/dL (ref 6.0–8.3)

## 2010-04-20 LAB — CBC
HCT: 21.6 % — ABNORMAL LOW (ref 39.0–52.0)
HCT: 21.7 % — ABNORMAL LOW (ref 39.0–52.0)
HCT: 22.3 % — ABNORMAL LOW (ref 39.0–52.0)
HCT: 23.8 % — ABNORMAL LOW (ref 39.0–52.0)
HCT: 22.7 % — ABNORMAL LOW (ref 39.0–52.0)
Hemoglobin: 7.6 g/dL — ABNORMAL LOW (ref 13.0–17.0)
Hemoglobin: 7.6 g/dL — ABNORMAL LOW (ref 13.0–17.0)
Hemoglobin: 8.8 g/dL — ABNORMAL LOW (ref 13.0–17.0)
MCHC: 33.9 g/dL (ref 30.0–36.0)
MCHC: 34.2 g/dL (ref 30.0–36.0)
MCHC: 34.2 g/dL (ref 30.0–36.0)
MCHC: 32.6 g/dL (ref 30.0–36.0)
MCHC: 34 g/dL (ref 30.0–36.0)
MCV: 102 fL — ABNORMAL HIGH (ref 78.0–100.0)
MCV: 102.1 fL — ABNORMAL HIGH (ref 78.0–100.0)
MCV: 102.3 fL — ABNORMAL HIGH (ref 78.0–100.0)
MCV: 101.4 fL — ABNORMAL HIGH (ref 78.0–100.0)
Platelets: 392 10*3/uL (ref 150–400)
Platelets: 395 10*3/uL (ref 150–400)
Platelets: 414 10*3/uL — ABNORMAL HIGH (ref 150–400)
Platelets: 426 10*3/uL — ABNORMAL HIGH (ref 150–400)
Platelets: 518 10*3/uL — ABNORMAL HIGH (ref 150–400)
Platelets: 404 10*3/uL — ABNORMAL HIGH (ref 150–400)
Platelets: 427 10*3/uL — ABNORMAL HIGH (ref 150–400)
RBC: 2.11 MIL/uL — ABNORMAL LOW (ref 4.22–5.81)
RBC: 2.12 MIL/uL — ABNORMAL LOW (ref 4.22–5.81)
RBC: 2.17 MIL/uL — ABNORMAL LOW (ref 4.22–5.81)
RBC: 2.2 MIL/uL — ABNORMAL LOW (ref 4.22–5.81)
RBC: 2.67 MIL/uL — ABNORMAL LOW (ref 4.22–5.81)
RDW: 18.8 % — ABNORMAL HIGH (ref 11.5–15.5)
RDW: 19.9 % — ABNORMAL HIGH (ref 11.5–15.5)
RDW: 20.5 % — ABNORMAL HIGH (ref 11.5–15.5)
RDW: 21.3 % — ABNORMAL HIGH (ref 11.5–15.5)
RDW: 18.7 % — ABNORMAL HIGH (ref 11.5–15.5)
WBC: 15.6 10*3/uL — ABNORMAL HIGH (ref 4.0–10.5)
WBC: 16 10*3/uL — ABNORMAL HIGH (ref 4.0–10.5)
WBC: 17 10*3/uL — ABNORMAL HIGH (ref 4.0–10.5)
WBC: 22.7 10*3/uL — ABNORMAL HIGH (ref 4.0–10.5)
WBC: 22.5 10*3/uL — ABNORMAL HIGH (ref 4.0–10.5)
WBC: 22.6 10*3/uL — ABNORMAL HIGH (ref 4.0–10.5)

## 2010-04-20 LAB — BASIC METABOLIC PANEL
BUN: 10 mg/dL (ref 6–23)
BUN: 6 mg/dL (ref 6–23)
BUN: 4 mg/dL — ABNORMAL LOW (ref 6–23)
Chloride: 111 mEq/L (ref 96–112)
Creatinine, Ser: 0.61 mg/dL (ref 0.4–1.5)
GFR calc Af Amer: >60 mL/min (ref >60)
GFR calc non Af Amer: >60 mL/min (ref >60)
GFR calc non Af Amer: >60 mL/min (ref >60)
Glucose, Bld: 108 mg/dL — ABNORMAL HIGH (ref 70–99)
Potassium: 4.2 mEq/L (ref 3.5–5.1)
Potassium: 3.5 mEq/L (ref 3.5–5.1)
Sodium: 138 mEq/L (ref 135–145)
Sodium: 140 mEq/L (ref 135–145)

## 2010-04-20 LAB — CROSSMATCH: Antibody Screen: NEGATIVE

## 2010-04-20 LAB — POCT I-STAT, CHEM 8
HCT: 30 % — ABNORMAL LOW (ref 39.0–52.0)
Hemoglobin: 10.2 g/dL — ABNORMAL LOW (ref 13.0–17.0)
Potassium: 3.3 mEq/L — ABNORMAL LOW (ref 3.5–5.1)
Sodium: 143 mEq/L (ref 135–145)

## 2010-04-20 LAB — DIFFERENTIAL
Basophils Relative: 1 % (ref 0–1)
Eosinophils Relative: 3 % (ref 0–5)
Lymphocytes Relative: 14 % (ref 12–46)
Neutrophils Relative %: 73 % (ref 43–77)

## 2010-04-20 LAB — URINALYSIS, ROUTINE W REFLEX MICROSCOPIC
Bilirubin Urine: NEGATIVE
Nitrite: NEGATIVE
Specific Gravity, Urine: 1.012 (ref 1.005–1.030)
pH: 7 (ref 5.0–8.0)

## 2010-04-20 LAB — LACTATE DEHYDROGENASE: LDH: 241 U/L (ref 94–250)

## 2010-04-20 LAB — RETICULOCYTES
RBC.: 2.67 MIL/uL — ABNORMAL LOW (ref 4.22–5.81)
Retic Count, Absolute: 261.7 10*3/uL — ABNORMAL HIGH (ref 19.0–186.0)
Retic Ct Pct: 9.8 % — ABNORMAL HIGH (ref 0.4–3.1)

## 2010-04-20 NOTE — H&P (Signed)
[name], [name]             ACCOUNT NO.:  [id number]  MEDICAL RECORD NO.:  [id number]           PATIENT TYPE:  E  LOCATION:  WLED                         FACILITY:  [facility]  PHYSICIAN:  Massie Maroon, MD        DATE OF BIRTH:  [birth year]  DATE OF ADMISSION:  03/05/2010 DATE OF DISCHARGE:                             HISTORY & PHYSICAL   PRIMARY CARE DOCTOR:  Lorelle Formosa, M.D.  CHIEF COMPLAINT:  "I got pain in my legs,  ribs, side, and this feels like my sickle cell crisis."  HISTORY OF PRESENT ILLNESS:  A 30 year old male with sickle cell, history of pulmonary embolism, presents with side, rib pain, reproducible with palpation and bilateral leg, bony discomfort, but no swelling noted.  The patient denies any palpitation, shortness of breath, nausea, vomiting, diarrhea, bright red blood per rectum, or black stool.  He notes that this pain is consistent with his sickle cell crisis pain.  He recalls not taking his folic acid, not staying well- hydrated, and not taking any hydroxyurea, which he has taken in the past. Patient was given some pain medication in the ER, but his pain was not controlled, so he will be admitted for sickle cell crisis.  PAST MEDICAL HISTORY: 1. Sickle cell anemia. 2. Pulmonary embolus. 3. Avascular necrosis of the left hip. 4. Avascular necrosis of the left shoulder. 5. History of chronic leukocytosis and thrombocytosis. 6. Cholecystectomy. 7. Umbilical hernia repair. 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  SOCIAL HISTORY:  The patient is a social drinker.  He quit smoking about February 2011 and smoked one-pack per day times 10 years.  He is not working.  FAMILY HISTORY:  Mother is alive at age 67, has diabetes.  Father died at age 69 of a gunshot wound.  He has three brothers who are healthy and one of the three has sickle cell trait.  ALLERGIES:  MORPHINE.  MEDICATIONS: 1. OxyContin 40 mg P.o. b.i.d. 2.  Ambien 5 mg p.o. q.h.s. 3. Potassium chloride 20 mEq p.o. daily. 4. Ibuprofen 800 mg p.o. t.i.d. 5. Hydroxyurea 500 mg p.o. t.i.d. 6. Dilaudid 4 mg p.o. q.4 h p.r.n. 7. Folic acid 1 mg p.o. daily.  REVIEW OF SYSTEMS:  Negative for fever, chills, negative for all 10 organ systems except for pertinent positives stated above.  PHYSICAL EXAM:  VITAL SIGNS:  Temperature 97.8, pulse 85, blood pressure 135/83, pulse ox is 99% on room air. HEENT:  Anicteric, EOMI, no nystagmus, pupils 1.5 mm, symmetric direct consensual near reflexes intact.  Mucous membranes moist. NECK:  No JVD, no bruit, no thyromegaly, no adenopathy. NEURO EXAM:  Nonfocal, cranial nerves II-XII intact.  Reflexes 2+, symmetric, diffuse with downgoing toes bilaterally, motor strength 5/5 in all 4 extremities, pinprick intact.  LABS:  Urinalysis negative.  WBC 28.1, hemoglobin 6.8, platelet count 627, BUN 3, creatinine 0.6.  Sodium 142, potassium 3.5, retic count 14.2%, retic count absolute 308.1.  Chest x-ray, chronic interstitial changes without evidence for superimposed pneumonia, mild cardiac enlargement.  ASSESSMENT/PLAN: 1. Sickle cell crisis:  The patient be hydrated with normal saline  IV,     and treated with Dilaudid and Benadryl.  The patient will be     started back on folic acid as well as hydroxyurea. 2. Leukocytosis, thrombocytosis:  This appears to be stable and     reactive. 3. Anemia: Slightly worse, the patient will be transfused 1 unit     packed red blood cells.     Massie Maroon, MD     JYK/MEDQ  D:  03/05/2010  T:  03/05/2010  Job:  [id number]  cc:   Lorelle Formosa, M.D. Fax: [phone]  Electronically Signed by Pearson Grippe MD on 04/20/2010 11:91:47 PM

## 2010-04-20 NOTE — Progress Notes (Signed)
  [name], [name]             ACCOUNT NO.:  [id number]  MEDICAL RECORD NO.:  [id number]           PATIENT TYPE:  I  LOCATION:  1304                         FACILITY:  [facility]  PHYSICIAN:  Homero Fellers, MD   DATE OF BIRTH:  04-21-1980                                PROGRESS NOTE   SUBJECTIVE: He complained of some pain in his left shoulder.  He is awaiting steroid injection sometime today.  No shortness of breath or chest pain.  OBJECTIVE: VITAL SIGNS:  Blood pressure 114/69, pulse 80, respirations 18, temperature 98.5, O2 saturation 96% on room air. GENERAL:  The patient is comfortable, somewhat sleepy and drowsy, but is arousable. HEENT:  Pallor.  Extraocular muscles are intact. NECK:  Supple.  No JVD, adenopathy, or thyromegaly. LUNGS:  Clear to auscultation bilaterally. HEART:  S1 and S2.  No murmurs. ABDOMEN:  Full, soft, nontender. EXTREMITIES:  No edema.  LABORATORY DATA: Today, potassium 4.4, BUN 11, creatinine 0.78, glucose 107.  White count is 20,000, hemoglobin 7.3, platelet count is 503,000.  ASSESSMENT: 1. Sickle cell crisis, which is improving slowly (this is vaso-     occlusive crisis). 2. Left femur avascular necrosis, left shoulder pain. 3. Anemia, likely hemolytic secondary to sickle cell disease. 4. Leukocytosis which is chronic for this patient. 5. Chronic thrombocytosis. 6. Resolved hypokalemia.  PLAN: Continue pain management with steroid injection to left shoulder today. Continue other management.  Overall condition is improving.  Hopefully, he can be discharged in the next 1 to 2 days depending on his clinical response.     Homero Fellers, MD     FA/MEDQ  D:  03/10/2010  T:  03/10/2010  Job:  [id number]  Electronically Signed by Homero Fellers  on 04/20/2010 02:15:17 AM

## 2010-04-21 LAB — COMPREHENSIVE METABOLIC PANEL
ALT: 35 U/L (ref 0–53)
AST: 31 U/L (ref 0–37)
AST: 35 U/L (ref 0–37)
Albumin: 4.4 g/dL (ref 3.5–5.2)
Alkaline Phosphatase: 149 U/L — ABNORMAL HIGH (ref 39–117)
BUN: 6 mg/dL (ref 6–23)
CO2: 25 mEq/L (ref 19–32)
Chloride: 104 mEq/L (ref 96–112)
Chloride: 109 mEq/L (ref 96–112)
Creatinine, Ser: 0.58 mg/dL (ref 0.4–1.5)
GFR calc Af Amer: >60 mL/min (ref >60)
GFR calc Af Amer: >60 mL/min (ref >60)
GFR calc non Af Amer: >60 mL/min (ref >60)
Glucose, Bld: 85 mg/dL (ref 70–99)
Potassium: 3.7 mEq/L (ref 3.5–5.1)
Potassium: 3.7 mEq/L (ref 3.5–5.1)
Sodium: 136 mEq/L (ref 135–145)
Total Bilirubin: 3.9 mg/dL — ABNORMAL HIGH (ref 0.3–1.2)
Total Protein: 7.6 g/dL (ref 6.0–8.3)

## 2010-04-21 LAB — CBC
Hemoglobin: 8.4 g/dL — ABNORMAL LOW (ref 13.0–17.0)
Platelets: 552 10*3/uL — ABNORMAL HIGH (ref 150–400)
RBC: 2.47 MIL/uL — ABNORMAL LOW (ref 4.22–5.81)
RDW: 17.1 % — ABNORMAL HIGH (ref 11.5–15.5)
WBC: 13.6 10*3/uL — ABNORMAL HIGH (ref 4.0–10.5)
WBC: 16.7 10*3/uL — ABNORMAL HIGH (ref 4.0–10.5)

## 2010-04-21 LAB — URINALYSIS, ROUTINE W REFLEX MICROSCOPIC
Bilirubin Urine: NEGATIVE
Glucose, UA: NEGATIVE mg/dL
Hgb urine dipstick: NEGATIVE
Specific Gravity, Urine: 1.015 (ref 1.005–1.030)
pH: 6.5 (ref 5.0–8.0)

## 2010-04-21 LAB — DIFFERENTIAL
Basophils Absolute: 0 10*3/uL (ref 0.0–0.1)
Basophils Relative: 0 % (ref 0–1)
Lymphocytes Relative: 15 % (ref 12–46)
Lymphs Abs: 2 10*3/uL (ref 0.7–4.0)
Monocytes Relative: 9 % (ref 3–12)
Neutro Abs: 10 10*3/uL — ABNORMAL HIGH (ref 1.7–7.7)

## 2010-04-25 LAB — CBC
HCT: 18.2 % — ABNORMAL LOW (ref 39.0–52.0)
HCT: 21.2 % — ABNORMAL LOW (ref 39.0–52.0)
HCT: 21.5 % — ABNORMAL LOW (ref 39.0–52.0)
HCT: 21.9 % — ABNORMAL LOW (ref 39.0–52.0)
HCT: 22.7 % — ABNORMAL LOW (ref 39.0–52.0)
HCT: 22.9 % — ABNORMAL LOW (ref 39.0–52.0)
HCT: 23 % — ABNORMAL LOW (ref 39.0–52.0)
HCT: 25.3 % — ABNORMAL LOW (ref 39.0–52.0)
HCT: 25.7 % — ABNORMAL LOW (ref 39.0–52.0)
HCT: 26.2 % — ABNORMAL LOW (ref 39.0–52.0)
HCT: 26.2 % — ABNORMAL LOW (ref 39.0–52.0)
Hemoglobin: 6.2 g/dL — CL (ref 13.0–17.0)
Hemoglobin: 7.1 g/dL — ABNORMAL LOW (ref 13.0–17.0)
Hemoglobin: 7.3 g/dL — ABNORMAL LOW (ref 13.0–17.0)
Hemoglobin: 7.4 g/dL — ABNORMAL LOW (ref 13.0–17.0)
Hemoglobin: 7.5 g/dL — ABNORMAL LOW (ref 13.0–17.0)
Hemoglobin: 7.7 g/dL — ABNORMAL LOW (ref 13.0–17.0)
Hemoglobin: 7.7 g/dL — ABNORMAL LOW (ref 13.0–17.0)
Hemoglobin: 8.4 g/dL — ABNORMAL LOW (ref 13.0–17.0)
Hemoglobin: 8.5 g/dL — ABNORMAL LOW (ref 13.0–17.0)
Hemoglobin: 8.6 g/dL — ABNORMAL LOW (ref 13.0–17.0)
Hemoglobin: 8.7 g/dL — ABNORMAL LOW (ref 13.0–17.0)
MCHC: 32.5 g/dL (ref 30.0–36.0)
MCHC: 33.3 g/dL (ref 30.0–36.0)
MCHC: 33.4 g/dL (ref 30.0–36.0)
MCHC: 34.2 g/dL (ref 30.0–36.0)
MCHC: 34.3 g/dL (ref 30.0–36.0)
MCV: 100 fL (ref 78.0–100.0)
MCV: 101 fL — ABNORMAL HIGH (ref 78.0–100.0)
MCV: 101.3 fL — ABNORMAL HIGH (ref 78.0–100.0)
MCV: 102.1 fL — ABNORMAL HIGH (ref 78.0–100.0)
MCV: 103.1 fL — ABNORMAL HIGH (ref 78.0–100.0)
MCV: 104.6 fL — ABNORMAL HIGH (ref 78.0–100.0)
MCV: 104.8 fL — ABNORMAL HIGH (ref 78.0–100.0)
Platelets: 419 10*3/uL — ABNORMAL HIGH (ref 150–400)
Platelets: 489 10*3/uL — ABNORMAL HIGH (ref 150–400)
Platelets: 495 10*3/uL — ABNORMAL HIGH (ref 150–400)
Platelets: 499 10*3/uL — ABNORMAL HIGH (ref 150–400)
Platelets: 503 10*3/uL — ABNORMAL HIGH (ref 150–400)
Platelets: 510 10*3/uL — ABNORMAL HIGH (ref 150–400)
RBC: 2.14 MIL/uL — ABNORMAL LOW (ref 4.22–5.81)
RBC: 2.16 MIL/uL — ABNORMAL LOW (ref 4.22–5.81)
RBC: 2.19 MIL/uL — ABNORMAL LOW (ref 4.22–5.81)
RBC: 2.24 MIL/uL — ABNORMAL LOW (ref 4.22–5.81)
RBC: 2.28 MIL/uL — ABNORMAL LOW (ref 4.22–5.81)
RBC: 2.42 MIL/uL — ABNORMAL LOW (ref 4.22–5.81)
RBC: 2.5 MIL/uL — ABNORMAL LOW (ref 4.22–5.81)
RDW: 17.8 % — ABNORMAL HIGH (ref 11.5–15.5)
RDW: 18.4 % — ABNORMAL HIGH (ref 11.5–15.5)
RDW: 18.5 % — ABNORMAL HIGH (ref 11.5–15.5)
RDW: 19 % — ABNORMAL HIGH (ref 11.5–15.5)
RDW: 20 % — ABNORMAL HIGH (ref 11.5–15.5)
RDW: 21.1 % — ABNORMAL HIGH (ref 11.5–15.5)
RDW: 21.5 % — ABNORMAL HIGH (ref 11.5–15.5)
WBC: 15.9 10*3/uL — ABNORMAL HIGH (ref 4.0–10.5)
WBC: 16.9 10*3/uL — ABNORMAL HIGH (ref 4.0–10.5)
WBC: 17 10*3/uL — ABNORMAL HIGH (ref 4.0–10.5)
WBC: 17 10*3/uL — ABNORMAL HIGH (ref 4.0–10.5)
WBC: 18.6 10*3/uL — ABNORMAL HIGH (ref 4.0–10.5)
WBC: 19.7 10*3/uL — ABNORMAL HIGH (ref 4.0–10.5)
WBC: 22.4 10*3/uL — ABNORMAL HIGH (ref 4.0–10.5)

## 2010-04-25 LAB — COMPREHENSIVE METABOLIC PANEL
AST: 34 U/L (ref 0–37)
Albumin: 3.8 g/dL (ref 3.5–5.2)
Alkaline Phosphatase: 132 U/L — ABNORMAL HIGH (ref 39–117)
Alkaline Phosphatase: 134 U/L — ABNORMAL HIGH (ref 39–117)
BUN: 11 mg/dL (ref 6–23)
BUN: 9 mg/dL (ref 6–23)
CO2: 24 mEq/L (ref 19–32)
CO2: 25 mEq/L (ref 19–32)
Calcium: 8.7 mg/dL (ref 8.4–10.5)
Chloride: 106 mEq/L (ref 96–112)
Creatinine, Ser: 0.52 mg/dL (ref 0.4–1.5)
Creatinine, Ser: 0.71 mg/dL (ref 0.4–1.5)
GFR calc Af Amer: >60 mL/min (ref >60)
GFR calc non Af Amer: >60 mL/min (ref >60)
GFR calc non Af Amer: >60 mL/min (ref >60)
Glucose, Bld: 104 mg/dL — ABNORMAL HIGH (ref 70–99)
Glucose, Bld: 113 mg/dL — ABNORMAL HIGH (ref 70–99)
Glucose, Bld: 93 mg/dL (ref 70–99)
Potassium: 3.7 mEq/L (ref 3.5–5.1)
Total Bilirubin: 4.5 mg/dL — ABNORMAL HIGH (ref 0.3–1.2)
Total Bilirubin: 6.4 mg/dL — ABNORMAL HIGH (ref 0.3–1.2)
Total Protein: 7.2 g/dL (ref 6.0–8.3)
Total Protein: 7.7 g/dL (ref 6.0–8.3)

## 2010-04-25 LAB — RETICULOCYTES
RBC.: 2.04 MIL/uL — ABNORMAL LOW (ref 4.22–5.81)
RBC.: 2.5 MIL/uL — ABNORMAL LOW (ref 4.22–5.81)
Retic Count, Absolute: 320 10*3/uL — ABNORMAL HIGH (ref 19.0–186.0)
Retic Count, Absolute: 367.2 10*3/uL — ABNORMAL HIGH (ref 19.0–186.0)
Retic Count, Absolute: 380 10*3/uL — ABNORMAL HIGH (ref 19.0–186.0)
Retic Ct Pct: 12.8 % — ABNORMAL HIGH (ref 0.4–3.1)
Retic Ct Pct: 17.6 % — ABNORMAL HIGH (ref 0.4–3.1)

## 2010-04-25 LAB — CK TOTAL AND CKMB (NOT AT ARMC)
CK, MB: 0.7 ng/mL (ref 0.3–4.0)
CK, MB: 0.8 ng/mL (ref 0.3–4.0)

## 2010-04-25 LAB — BASIC METABOLIC PANEL
BUN: 12 mg/dL (ref 6–23)
BUN: 4 mg/dL — ABNORMAL LOW (ref 6–23)
Chloride: 105 mEq/L (ref 96–112)
Chloride: 107 mEq/L (ref 96–112)
GFR calc Af Amer: >60 mL/min (ref >60)
GFR calc non Af Amer: >60 mL/min (ref >60)
GFR calc non Af Amer: >60 mL/min (ref >60)
GFR calc non Af Amer: >60 mL/min (ref >60)
Glucose, Bld: 91 mg/dL (ref 70–99)
Glucose, Bld: 97 mg/dL (ref 70–99)
Potassium: 3.1 mEq/L — ABNORMAL LOW (ref 3.5–5.1)
Potassium: 4 mEq/L (ref 3.5–5.1)
Potassium: 4 mEq/L (ref 3.5–5.1)
Sodium: 137 mEq/L (ref 135–145)
Sodium: 137 mEq/L (ref 135–145)

## 2010-04-25 LAB — POCT CARDIAC MARKERS: Myoglobin, poc: 17.2 ng/mL (ref 12–200)

## 2010-04-25 LAB — DIFFERENTIAL
Basophils Relative: 0 % (ref 0–1)
Eosinophils Relative: 3 % (ref 0–5)
Lymphs Abs: 2.6 10*3/uL (ref 0.7–4.0)
Monocytes Absolute: 1.7 10*3/uL — ABNORMAL HIGH (ref 0.1–1.0)
Monocytes Relative: 9 % (ref 3–12)
Neutro Abs: 13.7 10*3/uL — ABNORMAL HIGH (ref 1.7–7.7)

## 2010-04-25 LAB — HEPATIC FUNCTION PANEL
ALT: 38 U/L (ref 0–53)
AST: 35 U/L (ref 0–37)
Albumin: 4.2 g/dL (ref 3.5–5.2)
Alkaline Phosphatase: 150 U/L — ABNORMAL HIGH (ref 39–117)
Alkaline Phosphatase: 161 U/L — ABNORMAL HIGH (ref 39–117)
Indirect Bilirubin: 3.9 mg/dL — ABNORMAL HIGH (ref 0.3–0.9)
Total Bilirubin: 4.2 mg/dL — ABNORMAL HIGH (ref 0.3–1.2)
Total Bilirubin: 4.6 mg/dL — ABNORMAL HIGH (ref 0.3–1.2)
Total Protein: 7.4 g/dL (ref 6.0–8.3)
Total Protein: 7.7 g/dL (ref 6.0–8.3)

## 2010-04-25 LAB — PREPARE RBC (CROSSMATCH)

## 2010-04-25 LAB — FERRITIN
Ferritin: 1162 ng/mL — ABNORMAL HIGH (ref 22–322)
Ferritin: 1391 ng/mL — ABNORMAL HIGH (ref 22–322)

## 2010-04-25 LAB — POCT I-STAT, CHEM 8
Calcium, Ion: 1.16 mmol/L (ref 1.12–1.32)
Creatinine, Ser: 0.7 mg/dL (ref 0.4–1.5)
Glucose, Bld: 97 mg/dL (ref 70–99)
Hemoglobin: 8.8 g/dL — ABNORMAL LOW (ref 13.0–17.0)
Potassium: 3.5 mEq/L (ref 3.5–5.1)

## 2010-04-25 LAB — IRON AND TIBC
Saturation Ratios: 57 % — ABNORMAL HIGH (ref 20–55)
UIBC: 113 ug/dL

## 2010-04-25 LAB — CROSSMATCH: ABO/RH(D): O POS

## 2010-04-25 LAB — LIPID PANEL
Cholesterol: 86 mg/dL (ref 0–200)
LDL Cholesterol: 39 mg/dL (ref 0–99)
Total CHOL/HDL Ratio: 2.5 RATIO

## 2010-04-25 LAB — VITAMIN D 1,25 DIHYDROXY: Vitamin D 1, 25 (OH)2 Total: 38 pg/mL (ref 18–72)

## 2010-04-25 LAB — TROPONIN I: Troponin I: 0.02 ng/mL (ref 0.00–0.06)

## 2010-04-29 NOTE — Discharge Summary (Signed)
[name], [name]             ACCOUNT NO.:  [id number]  MEDICAL RECORD NO.:  [id number]           PATIENT TYPE:  I  LOCATION:  1334                         FACILITY:  [facility]  PHYSICIAN:  Macaela Presas L. [name], M.D.     DATE OF BIRTH:  1980/02/14  DATE OF ADMISSION:  03/25/2010 DATE OF DISCHARGE:  04/06/2010                              DISCHARGE SUMMARY   FINAL DIAGNOSES: 1. Sickle cell crisis. 2. Avascular necrosis, left shoulder. 3. Leukocytosis, reactive in nature. 4. Mild bronchitis. 5. Anxiety disorder with situational stress.  OPERATIONS/PROCEDURES:  Transfusion of packed RBCs x2.  HISTORY OF PRESENT ILLNESS:  This is one of several [employer] [facility] admissions for this 30 year old single black male with a history of sickle cell anemia who presented to [facility] Emergency Room complaining of pain in ribs and legs typical for sickle cell crisis. The patient described the pain as sharp and constant, 7/10 to 8/10 intensity with some nausea and mild shortness of breath.  The patient stated that he had not been experiencing any fever or chills or productive cough.  There was no associated vomiting, abdominal pain, or diarrhea.  He states his appetite has been okay.  The patient notes that he did run out of medication in the past 2 weeks, which most likely caused him to have his sickle cell crisis.  The patient was seen initially the emergency room.  He did not respond to treatment.  He was subsequently admitted for further therapy.  PAST MEDICAL HISTORY:  Per admission H and P.  PHYSICAL EXAMINATION:  Per admission H and P.  HOSPITAL COURSE:  The patient was admitted for further treatment of sickle cell crisis.  At the time of admission, a chest x-ray demonstrated mild cardiac enlargement with basilar interstitial changes. His hemoglobin was 9.0 at the time with a white count of 13.8.  The patient was started on IV fluids.  He initially was not given antibiotics.  He was  placed on Lovenox for deep venous thrombosis prophylaxis.  The patient was subsequently transferred to my service on March 26, 2010 in view of his congestion.  He was also noted to have significant leukocytosis.  He was placed on empiric antibiotics.  He was also started on incentive spirometry.  He was placed on Benadryl as well.  Over the subsequent days, the patient made slow, but steady improvement. He continued to have intermittent left-sided chest pain.  His cough did become more productive of yellowish green phlegm while he was in the hospital.  He was continued on Zithromax.  He also complained of difficulty sleeping.  He had previously been treated with Zyprexa, which helped him a great deal.  This was restarted.  A followup hemoglobin on February 28th showed a drop to 7.0.  His bilirubin was still elevated at that time.  He was subsequently transfused 1 unit of packed RBCs.  A followup hemoglobin had revealed a value of 8.3  He continued to have an elevated total bilirubin at 8.4 consistent with extreme hemolysis.  The patient was maintained on supportive measures.  Because of his leukocytosis, he was placed on  Rocephin IV as well.  Followup chest x- rays showed basilar atelectasis without active infiltrates.  With continued supportive measures, the patient slowly resolve his crisis.  On March 4th, however, he complained of increasing left shoulder pain.  This notably was in the site of previous problem of avascular crosses.  With continued supportive measures, however, his pain did become more manageable.  By April 06, 2010, the patient was feeling much better.  He felt he could manage pain at home at that time.  Medications at the time of discharge consisted of, 1. Zyprexa 5 mg nightly. 2. Ambien 10 mg nightly. 3. Benadryl 50 mg q.4 h. p.r.n. 4. Folic acid 1 mg daily. 5. Dilaudid 4 mg q.4 h. p.r.n. pain. 6. Hydroxyurea 500 mg b.i.d. 7. Ibuprofen 800 mg t.i.d. 8.  Klor-Con 20 mEq daily. 9. OxyContin 40 mg b.i.d.  The patient will be maintained on a regular diet.  He has been instructed to follow up with Dr. [name] in 10 days' time.          ______________________________ Lind Guest [name], M.D.     ELD/MEDQ  D:  04/28/2010  T:  04/29/2010  Job:  [id number]  Electronically Signed by Willey Blade M.D. on 04/29/2010 12:08:31 PM

## 2010-05-04 LAB — BASIC METABOLIC PANEL
BUN: 3 mg/dL — ABNORMAL LOW (ref 6–23)
BUN: 5 mg/dL — ABNORMAL LOW (ref 6–23)
BUN: 9 mg/dL (ref 6–23)
BUN: 9 mg/dL (ref 6–23)
BUN: 9 mg/dL (ref 6–23)
CO2: 24 mEq/L (ref 19–32)
CO2: 24 mEq/L (ref 19–32)
CO2: 25 mEq/L (ref 19–32)
CO2: 25 mEq/L (ref 19–32)
CO2: 27 mEq/L (ref 19–32)
Calcium: 9 mg/dL (ref 8.4–10.5)
Calcium: 9.3 mg/dL (ref 8.4–10.5)
Calcium: 9.3 mg/dL (ref 8.4–10.5)
Chloride: 100 mEq/L (ref 96–112)
Chloride: 100 mEq/L (ref 96–112)
Chloride: 101 mEq/L (ref 96–112)
Chloride: 102 mEq/L (ref 96–112)
Chloride: 103 mEq/L (ref 96–112)
Chloride: 103 mEq/L (ref 96–112)
Chloride: 103 mEq/L (ref 96–112)
Chloride: 106 mEq/L (ref 96–112)
Creatinine, Ser: 0.54 mg/dL (ref 0.4–1.5)
Creatinine, Ser: 0.54 mg/dL (ref 0.4–1.5)
Creatinine, Ser: 0.55 mg/dL (ref 0.4–1.5)
Creatinine, Ser: 0.56 mg/dL (ref 0.4–1.5)
Creatinine, Ser: 0.56 mg/dL (ref 0.4–1.5)
GFR calc Af Amer: >60 mL/min (ref >60)
GFR calc Af Amer: >60 mL/min (ref >60)
GFR calc Af Amer: >60 mL/min (ref >60)
GFR calc Af Amer: >60 mL/min (ref >60)
GFR calc non Af Amer: >60 mL/min (ref >60)
GFR calc non Af Amer: >60 mL/min (ref >60)
GFR calc non Af Amer: >60 mL/min (ref >60)
Glucose, Bld: 101 mg/dL — ABNORMAL HIGH (ref 70–99)
Glucose, Bld: 101 mg/dL — ABNORMAL HIGH (ref 70–99)
Glucose, Bld: 103 mg/dL — ABNORMAL HIGH (ref 70–99)
Glucose, Bld: 98 mg/dL (ref 70–99)
Potassium: 3.4 mEq/L — ABNORMAL LOW (ref 3.5–5.1)
Potassium: 3.9 mEq/L (ref 3.5–5.1)
Potassium: 4.2 mEq/L (ref 3.5–5.1)
Potassium: 4.2 mEq/L (ref 3.5–5.1)
Potassium: 4.3 mEq/L (ref 3.5–5.1)
Sodium: 133 mEq/L — ABNORMAL LOW (ref 135–145)
Sodium: 134 mEq/L — ABNORMAL LOW (ref 135–145)
Sodium: 136 mEq/L (ref 135–145)
Sodium: 137 mEq/L (ref 135–145)

## 2010-05-04 LAB — CULTURE, BLOOD (ROUTINE X 2): Culture: NO GROWTH

## 2010-05-04 LAB — CROSSMATCH
ABO/RH(D): O POS
ABO/RH(D): O POS
Antibody Screen: NEGATIVE

## 2010-05-04 LAB — COMPREHENSIVE METABOLIC PANEL
ALT: 21 U/L (ref 0–53)
AST: 26 U/L (ref 0–37)
Alkaline Phosphatase: 96 U/L (ref 39–117)
CO2: 24 mEq/L (ref 19–32)
Calcium: 8.7 mg/dL (ref 8.4–10.5)
Chloride: 109 mEq/L (ref 96–112)
GFR calc Af Amer: >60 mL/min (ref >60)
GFR calc non Af Amer: >60 mL/min (ref >60)
Glucose, Bld: 101 mg/dL — ABNORMAL HIGH (ref 70–99)
Potassium: 3.3 mEq/L — ABNORMAL LOW (ref 3.5–5.1)
Sodium: 140 mEq/L (ref 135–145)

## 2010-05-04 LAB — URINALYSIS, ROUTINE W REFLEX MICROSCOPIC
Bilirubin Urine: NEGATIVE
Glucose, UA: NEGATIVE mg/dL
Hgb urine dipstick: NEGATIVE
Ketones, ur: NEGATIVE mg/dL
Nitrite: NEGATIVE
Protein, ur: NEGATIVE mg/dL
Specific Gravity, Urine: 1.016 (ref 1.005–1.030)
Urobilinogen, UA: 1 mg/dL (ref 0.0–1.0)
pH: 6 (ref 5.0–8.0)

## 2010-05-04 LAB — DIFFERENTIAL
Basophils Absolute: 0 K/uL (ref 0.0–0.1)
Basophils Relative: 0 % (ref 0–1)
Eosinophils Absolute: 0.5 K/uL (ref 0.0–0.7)
Eosinophils Relative: 3 % (ref 0–5)
Eosinophils Relative: 3 % (ref 0–5)
Lymphocytes Relative: 17 % (ref 12–46)
Lymphocytes Relative: 17 % (ref 12–46)
Lymphs Abs: 2.7 10*3/uL (ref 0.7–4.0)
Monocytes Absolute: 1.4 10*3/uL — ABNORMAL HIGH (ref 0.1–1.0)
Monocytes Relative: 7 % (ref 3–12)
Monocytes Relative: 9 % (ref 3–12)
Neutro Abs: 11 K/uL — ABNORMAL HIGH (ref 1.7–7.7)
Neutrophils Relative %: 71 % (ref 43–77)
Neutrophils Relative %: 73 % (ref 43–77)

## 2010-05-04 LAB — BASIC METABOLIC PANEL WITH GFR
BUN: 4 mg/dL — ABNORMAL LOW (ref 6–23)
CO2: 26 meq/L (ref 19–32)
Calcium: 8.7 mg/dL (ref 8.4–10.5)
Chloride: 105 meq/L (ref 96–112)
Creatinine, Ser: 0.52 mg/dL (ref 0.4–1.5)
GFR calc non Af Amer: >60 mL/min
Glucose, Bld: 91 mg/dL (ref 70–99)
Potassium: 3.2 meq/L — ABNORMAL LOW (ref 3.5–5.1)
Sodium: 138 meq/L (ref 135–145)

## 2010-05-04 LAB — RETICULOCYTES
RBC.: 2.14 MIL/uL — ABNORMAL LOW (ref 4.22–5.81)
RBC.: 2.33 MIL/uL — ABNORMAL LOW (ref 4.22–5.81)
Retic Count, Absolute: 167.8 K/uL (ref 19.0–186.0)
Retic Ct Pct: 7.2 % — ABNORMAL HIGH (ref 0.4–3.1)
Retic Ct Pct: 8.6 % — ABNORMAL HIGH (ref 0.4–3.1)

## 2010-05-04 LAB — CBC
HCT: 22.6 % — ABNORMAL LOW (ref 39.0–52.0)
HCT: 22.8 % — ABNORMAL LOW (ref 39.0–52.0)
HCT: 23.4 % — ABNORMAL LOW (ref 39.0–52.0)
HCT: 24.5 % — ABNORMAL LOW (ref 39.0–52.0)
HCT: 24.9 % — ABNORMAL LOW (ref 39.0–52.0)
HCT: 25.4 % — ABNORMAL LOW (ref 39.0–52.0)
HCT: 27.1 % — ABNORMAL LOW (ref 39.0–52.0)
Hemoglobin: 6.8 g/dL — CL (ref 13.0–17.0)
Hemoglobin: 7.5 g/dL — ABNORMAL LOW (ref 13.0–17.0)
Hemoglobin: 7.6 g/dL — ABNORMAL LOW (ref 13.0–17.0)
Hemoglobin: 8.3 g/dL — ABNORMAL LOW (ref 13.0–17.0)
Hemoglobin: 8.6 g/dL — ABNORMAL LOW (ref 13.0–17.0)
MCHC: 33 g/dL (ref 30.0–36.0)
MCHC: 33.1 g/dL (ref 30.0–36.0)
MCHC: 33.4 g/dL (ref 30.0–36.0)
MCHC: 33.9 g/dL (ref 30.0–36.0)
MCHC: 33.9 g/dL (ref 30.0–36.0)
MCHC: 34.1 g/dL (ref 30.0–36.0)
MCHC: 34.3 g/dL (ref 30.0–36.0)
MCV: 93.7 fL (ref 78.0–100.0)
MCV: 94.1 fL (ref 78.0–100.0)
MCV: 94.1 fL (ref 78.0–100.0)
MCV: 95.5 fL (ref 78.0–100.0)
MCV: 97.6 fL (ref 78.0–100.0)
MCV: 97.7 fL (ref 78.0–100.0)
MCV: 97.9 fL (ref 78.0–100.0)
MCV: 98.5 fL (ref 78.0–100.0)
MCV: 98.5 fL (ref 78.0–100.0)
Platelets: 400 10*3/uL (ref 150–400)
Platelets: 474 10*3/uL — ABNORMAL HIGH (ref 150–400)
Platelets: 478 10*3/uL — ABNORMAL HIGH (ref 150–400)
Platelets: 485 K/uL — ABNORMAL HIGH (ref 150–400)
Platelets: 486 10*3/uL — ABNORMAL HIGH (ref 150–400)
Platelets: 492 10*3/uL — ABNORMAL HIGH (ref 150–400)
Platelets: 506 10*3/uL — ABNORMAL HIGH (ref 150–400)
RBC: 2.08 MIL/uL — ABNORMAL LOW (ref 4.22–5.81)
RBC: 2.32 MIL/uL — ABNORMAL LOW (ref 4.22–5.81)
RBC: 2.45 MIL/uL — ABNORMAL LOW (ref 4.22–5.81)
RBC: 2.48 MIL/uL — ABNORMAL LOW (ref 4.22–5.81)
RBC: 2.53 MIL/uL — ABNORMAL LOW (ref 4.22–5.81)
RBC: 2.84 MIL/uL — ABNORMAL LOW (ref 4.22–5.81)
RDW: 16.5 % — ABNORMAL HIGH (ref 11.5–15.5)
RDW: 16.9 % — ABNORMAL HIGH (ref 11.5–15.5)
RDW: 17.3 % — ABNORMAL HIGH (ref 11.5–15.5)
RDW: 19.3 % — ABNORMAL HIGH (ref 11.5–15.5)
RDW: 19.7 % — ABNORMAL HIGH (ref 11.5–15.5)
WBC: 15.4 10*3/uL — ABNORMAL HIGH (ref 4.0–10.5)
WBC: 15.5 10*3/uL — ABNORMAL HIGH (ref 4.0–10.5)
WBC: 15.6 K/uL — ABNORMAL HIGH (ref 4.0–10.5)
WBC: 18.3 10*3/uL — ABNORMAL HIGH (ref 4.0–10.5)
WBC: 18.8 10*3/uL — ABNORMAL HIGH (ref 4.0–10.5)
WBC: 19 10*3/uL — ABNORMAL HIGH (ref 4.0–10.5)
WBC: 19.3 10*3/uL — ABNORMAL HIGH (ref 4.0–10.5)
WBC: 21.3 10*3/uL — ABNORMAL HIGH (ref 4.0–10.5)

## 2010-05-04 LAB — RAPID URINE DRUG SCREEN, HOSP PERFORMED
Barbiturates: NOT DETECTED
Opiates: POSITIVE — AB

## 2010-05-04 LAB — TYPE AND SCREEN
ABO/RH(D): O POS
Antibody Screen: NEGATIVE

## 2010-05-04 LAB — FERRITIN: Ferritin: 1370 ng/mL — ABNORMAL HIGH (ref 22–322)

## 2010-05-06 LAB — CBC
HCT: 27.6 % — ABNORMAL LOW (ref 39.0–52.0)
Hemoglobin: 9.4 g/dL — ABNORMAL LOW (ref 13.0–17.0)
MCV: 101.7 fL — ABNORMAL HIGH (ref 78.0–100.0)
RBC: 2.72 MIL/uL — ABNORMAL LOW (ref 4.22–5.81)
WBC: 17 10*3/uL — ABNORMAL HIGH (ref 4.0–10.5)

## 2010-05-06 LAB — DIFFERENTIAL
Basophils Absolute: 0 10*3/uL (ref 0.0–0.1)
Basophils Relative: 0 % (ref 0–1)
Eosinophils Absolute: 0.3 10*3/uL (ref 0.0–0.7)
Lymphocytes Relative: 16 % (ref 12–46)
Monocytes Absolute: 0.9 10*3/uL (ref 0.1–1.0)
Neutrophils Relative %: 77 % (ref 43–77)

## 2010-05-06 LAB — COMPREHENSIVE METABOLIC PANEL
Albumin: 4.5 g/dL (ref 3.5–5.2)
Alkaline Phosphatase: 121 U/L — ABNORMAL HIGH (ref 39–117)
BUN: 9 mg/dL (ref 6–23)
CO2: 24 mEq/L (ref 19–32)
Chloride: 106 mEq/L (ref 96–112)
Creatinine, Ser: 0.6 mg/dL (ref 0.4–1.5)
GFR calc non Af Amer: >60 mL/min (ref >60)
Glucose, Bld: 86 mg/dL (ref 70–99)
Potassium: 4 mEq/L (ref 3.5–5.1)
Total Bilirubin: 5.8 mg/dL — ABNORMAL HIGH (ref 0.3–1.2)

## 2010-05-06 LAB — LIPASE, BLOOD: Lipase: 26 U/L (ref 11–59)

## 2010-05-06 LAB — URINALYSIS, ROUTINE W REFLEX MICROSCOPIC
Glucose, UA: NEGATIVE mg/dL
Hgb urine dipstick: NEGATIVE
Ketones, ur: NEGATIVE mg/dL
pH: 6 (ref 5.0–8.0)

## 2010-05-06 LAB — RETICULOCYTES: Retic Ct Pct: 9.3 % — ABNORMAL HIGH (ref 0.4–3.1)

## 2010-05-07 ENCOUNTER — Emergency Department (HOSPITAL_COMMUNITY)
Admission: EM | Admit: 2010-05-07 | Discharge: 2010-05-07 | Disposition: A | Discharge status: Home / Self Care | Payer: Medicare Other | Attending: Emergency Medicine | Admitting: Emergency Medicine

## 2010-05-07 DIAGNOSIS — D5700 Hb-SS disease with crisis, unspecified: Secondary | ICD-10-CM | POA: Insufficient documentation

## 2010-05-07 DIAGNOSIS — R109 Unspecified abdominal pain: Secondary | ICD-10-CM | POA: Insufficient documentation

## 2010-05-07 LAB — CROSSMATCH: Antibody Screen: NEGATIVE

## 2010-05-07 LAB — CBC
HCT: 22.6 % — ABNORMAL LOW (ref 39.0–52.0)
HCT: 23.2 % — ABNORMAL LOW (ref 39.0–52.0)
HCT: 25.6 % — ABNORMAL LOW (ref 39.0–52.0)
HCT: 26.5 % — ABNORMAL LOW (ref 39.0–52.0)
Hemoglobin: 9 g/dL — ABNORMAL LOW (ref 13.0–17.0)
Hemoglobin: 7.6 g/dL — CL (ref 13.0–17.0)
Hemoglobin: 7.9 g/dL — CL (ref 13.0–17.0)
Hemoglobin: 8.5 g/dL — ABNORMAL LOW (ref 13.0–17.0)
Hemoglobin: 8.8 g/dL — ABNORMAL LOW (ref 13.0–17.0)
MCH: 31.4 pg (ref 26.0–34.0)
MCHC: 33.6 g/dL (ref 30.0–36.0)
MCHC: 33.1 g/dL (ref 30.0–36.0)
MCHC: 33.3 g/dL (ref 30.0–36.0)
MCHC: 33.4 g/dL (ref 30.0–36.0)
MCV: 93.5 fL (ref 78.0–100.0)
MCV: 101.7 fL — ABNORMAL HIGH (ref 78.0–100.0)
MCV: 101.7 fL — ABNORMAL HIGH (ref 78.0–100.0)
MCV: 101.9 fL — ABNORMAL HIGH (ref 78.0–100.0)
MCV: 99.3 fL (ref 78.0–100.0)
Platelets: 486 10*3/uL — ABNORMAL HIGH (ref 150–400)
Platelets: 449 10*3/uL — ABNORMAL HIGH (ref 150–400)
Platelets: 454 10*3/uL — ABNORMAL HIGH (ref 150–400)
RBC: 2.77 MIL/uL — ABNORMAL LOW (ref 4.22–5.81)
RBC: 2.28 MIL/uL — ABNORMAL LOW (ref 4.22–5.81)
RBC: 2.36 MIL/uL — ABNORMAL LOW (ref 4.22–5.81)
RBC: 2.5 MIL/uL — ABNORMAL LOW (ref 4.22–5.81)
RBC: 2.6 MIL/uL — ABNORMAL LOW (ref 4.22–5.81)
RDW: 16.5 % — ABNORMAL HIGH (ref 11.5–15.5)
RDW: 16.3 % — ABNORMAL HIGH (ref 11.5–15.5)
RDW: 16.3 % — ABNORMAL HIGH (ref 11.5–15.5)
RDW: 16.6 % — ABNORMAL HIGH (ref 11.5–15.5)
WBC: 24.3 10*3/uL — ABNORMAL HIGH (ref 4.0–10.5)
WBC: 19.5 10*3/uL — ABNORMAL HIGH (ref 4.0–10.5)
WBC: 19.7 10*3/uL — ABNORMAL HIGH (ref 4.0–10.5)
WBC: 21.4 10*3/uL — ABNORMAL HIGH (ref 4.0–10.5)

## 2010-05-07 LAB — DIFFERENTIAL
Band Neutrophils: 0 % (ref 0–10)
Basophils Absolute: 0.4 10*3/uL — ABNORMAL HIGH (ref 0.0–0.1)
Basophils Relative: 0 % (ref 0–1)
Basophils Relative: 1 % (ref 0–1)
Basophils Relative: 2 % — ABNORMAL HIGH (ref 0–1)
Eosinophils Absolute: 0.4 10*3/uL (ref 0.0–0.7)
Eosinophils Absolute: 0.3 10*3/uL (ref 0.0–0.7)
Eosinophils Absolute: 0.6 10*3/uL (ref 0.0–0.7)
Eosinophils Relative: 3 % (ref 0–5)
Eosinophils Relative: 3 % (ref 0–5)
Eosinophils Relative: 3 % (ref 0–5)
Lymphs Abs: 2.7 10*3/uL (ref 0.7–4.0)
Lymphs Abs: 5.9 10*3/uL — ABNORMAL HIGH (ref 0.7–4.0)
Metamyelocytes Relative: 0 %
Monocytes Absolute: 2 10*3/uL — ABNORMAL HIGH (ref 0.1–1.0)
Monocytes Absolute: 1.9 10*3/uL — ABNORMAL HIGH (ref 0.1–1.0)
Monocytes Absolute: 1.5 10*3/uL — ABNORMAL HIGH (ref 0.1–1.0)
Monocytes Relative: 12 % (ref 3–12)
Monocytes Relative: 8 % (ref 3–12)
Myelocytes: 0 %
Neutrophils Relative %: 84 % — ABNORMAL HIGH (ref 43–77)
Promyelocytes Absolute: 0 %

## 2010-05-07 LAB — COMPREHENSIVE METABOLIC PANEL
ALT: 45 U/L (ref 0–53)
ALT: 29 U/L (ref 0–53)
ALT: 30 U/L (ref 0–53)
ALT: 31 U/L (ref 0–53)
AST: 26 U/L (ref 0–37)
AST: 36 U/L (ref 0–37)
Albumin: 4 g/dL (ref 3.5–5.2)
Albumin: 4 g/dL (ref 3.5–5.2)
Albumin: 4.4 g/dL (ref 3.5–5.2)
Alkaline Phosphatase: 197 U/L — ABNORMAL HIGH (ref 39–117)
Alkaline Phosphatase: 154 U/L — ABNORMAL HIGH (ref 39–117)
Alkaline Phosphatase: 173 U/L — ABNORMAL HIGH (ref 39–117)
Alkaline Phosphatase: 181 U/L — ABNORMAL HIGH (ref 39–117)
BUN: 4 mg/dL — ABNORMAL LOW (ref 6–23)
BUN: 4 mg/dL — ABNORMAL LOW (ref 6–23)
CO2: 23 mEq/L (ref 19–32)
CO2: 25 mEq/L (ref 19–32)
CO2: 25 mEq/L (ref 19–32)
Calcium: 9 mg/dL (ref 8.4–10.5)
Chloride: 100 mEq/L (ref 96–112)
Chloride: 102 mEq/L (ref 96–112)
Chloride: 103 mEq/L (ref 96–112)
Chloride: 103 mEq/L (ref 96–112)
Chloride: 106 mEq/L (ref 96–112)
Creatinine, Ser: 0.45 mg/dL (ref 0.4–1.5)
Creatinine, Ser: 0.51 mg/dL (ref 0.4–1.5)
Creatinine, Ser: 0.58 mg/dL (ref 0.4–1.5)
GFR calc Af Amer: >60 mL/min (ref >60)
GFR calc Af Amer: >60 mL/min (ref >60)
GFR calc non Af Amer: >60 mL/min (ref >60)
GFR calc non Af Amer: >60 mL/min (ref >60)
GFR calc non Af Amer: >60 mL/min (ref >60)
Glucose, Bld: 132 mg/dL — ABNORMAL HIGH (ref 70–99)
Glucose, Bld: 105 mg/dL — ABNORMAL HIGH (ref 70–99)
Glucose, Bld: 90 mg/dL (ref 70–99)
Glucose, Bld: 94 mg/dL (ref 70–99)
Potassium: 3.2 mEq/L — ABNORMAL LOW (ref 3.5–5.1)
Potassium: 3.3 mEq/L — ABNORMAL LOW (ref 3.5–5.1)
Potassium: 3.7 mEq/L (ref 3.5–5.1)
Potassium: 3.8 mEq/L (ref 3.5–5.1)
Sodium: 135 mEq/L (ref 135–145)
Sodium: 135 mEq/L (ref 135–145)
Sodium: 135 mEq/L (ref 135–145)
Total Bilirubin: 5.3 mg/dL — ABNORMAL HIGH (ref 0.3–1.2)
Total Bilirubin: 6.2 mg/dL — ABNORMAL HIGH (ref 0.3–1.2)
Total Bilirubin: 6.8 mg/dL — ABNORMAL HIGH (ref 0.3–1.2)
Total Bilirubin: 8.1 mg/dL — ABNORMAL HIGH (ref 0.3–1.2)
Total Bilirubin: 9.9 mg/dL — ABNORMAL HIGH (ref 0.3–1.2)
Total Protein: 8.8 g/dL — ABNORMAL HIGH (ref 6.0–8.3)
Total Protein: 7.6 g/dL (ref 6.0–8.3)
Total Protein: 7.9 g/dL (ref 6.0–8.3)

## 2010-05-07 LAB — URINALYSIS, ROUTINE W REFLEX MICROSCOPIC
Glucose, UA: NEGATIVE mg/dL
Ketones, ur: NEGATIVE mg/dL
Leukocytes, UA: NEGATIVE
Nitrite: NEGATIVE
Nitrite: NEGATIVE
Specific Gravity, Urine: 1.014 (ref 1.005–1.030)
pH: 6 (ref 5.0–8.0)
pH: 7 (ref 5.0–8.0)

## 2010-05-07 LAB — URINE MICROSCOPIC-ADD ON

## 2010-05-07 LAB — POCT CARDIAC MARKERS
CKMB, poc: <1 ng/mL — ABNORMAL LOW (ref 1.0–8.0)
Troponin i, poc: <0.05 ng/mL (ref 0.00–0.09)

## 2010-05-07 LAB — POCT I-STAT, CHEM 8
Glucose, Bld: 112 mg/dL — ABNORMAL HIGH (ref 70–99)
HCT: 30 % — ABNORMAL LOW (ref 39.0–52.0)
Hemoglobin: 10.2 g/dL — ABNORMAL LOW (ref 13.0–17.0)
Potassium: 3 mEq/L — ABNORMAL LOW (ref 3.5–5.1)

## 2010-05-07 LAB — RETICULOCYTES
RBC.: 2.74 MIL/uL — ABNORMAL LOW (ref 4.22–5.81)
RBC.: 2.59 MIL/uL — ABNORMAL LOW (ref 4.22–5.81)
Retic Count, Absolute: 316 10*3/uL — ABNORMAL HIGH (ref 19.0–186.0)
Retic Ct Pct: 10.7 % — ABNORMAL HIGH (ref 0.4–3.1)
Retic Ct Pct: 12.2 % — ABNORMAL HIGH (ref 0.4–3.1)

## 2010-05-07 LAB — CULTURE, BLOOD (ROUTINE X 2): Culture: NO GROWTH

## 2010-05-07 LAB — URINE CULTURE: Culture: NO GROWTH

## 2010-05-07 LAB — RAPID URINE DRUG SCREEN, HOSP PERFORMED
Benzodiazepines: NOT DETECTED
Cocaine: NOT DETECTED
Opiates: POSITIVE — AB

## 2010-05-07 LAB — HEPATIC FUNCTION PANEL
ALT: 28 U/L (ref 0–53)
Alkaline Phosphatase: 155 U/L — ABNORMAL HIGH (ref 39–117)
Total Bilirubin: 3.7 mg/dL — ABNORMAL HIGH (ref 0.3–1.2)

## 2010-05-07 LAB — FOLATE: Folate: 6.8 ng/mL

## 2010-05-07 LAB — LIPASE, BLOOD: Lipase: 26 U/L (ref 11–59)

## 2010-05-07 LAB — MAGNESIUM: Magnesium: 1.8 mg/dL (ref 1.5–2.5)

## 2010-05-10 LAB — CBC
Hemoglobin: 8.6 g/dL — ABNORMAL LOW (ref 13.0–17.0)
RBC: 2.53 MIL/uL — ABNORMAL LOW (ref 4.22–5.81)
RDW: 16.5 % — ABNORMAL HIGH (ref 11.5–15.5)
WBC: 17.9 10*3/uL — ABNORMAL HIGH (ref 4.0–10.5)

## 2010-05-10 LAB — BASIC METABOLIC PANEL
Calcium: 8.8 mg/dL (ref 8.4–10.5)
GFR calc Af Amer: >60 mL/min (ref >60)
GFR calc non Af Amer: >60 mL/min (ref >60)
Glucose, Bld: 114 mg/dL — ABNORMAL HIGH (ref 70–99)
Potassium: 3.6 mEq/L (ref 3.5–5.1)
Sodium: 137 mEq/L (ref 135–145)

## 2010-05-10 LAB — DIFFERENTIAL
Basophils Absolute: 0.1 10*3/uL (ref 0.0–0.1)
Lymphocytes Relative: 20 % (ref 12–46)
Lymphs Abs: 3.5 10*3/uL (ref 0.7–4.0)
Monocytes Absolute: 1.4 10*3/uL — ABNORMAL HIGH (ref 0.1–1.0)
Neutro Abs: 12.4 10*3/uL — ABNORMAL HIGH (ref 1.7–7.7)

## 2010-05-10 LAB — RETICULOCYTES: Retic Count, Absolute: 206.6 10*3/uL — ABNORMAL HIGH (ref 19.0–186.0)

## 2010-05-11 LAB — CARDIAC PANEL(CRET KIN+CKTOT+MB+TROPI)
CK, MB: 2.2 ng/mL (ref 0.3–4.0)
CK, MB: 2.8 ng/mL (ref 0.3–4.0)
CK, MB: 3.1 ng/mL (ref 0.3–4.0)
Relative Index: 0.9 (ref 0.0–2.5)
Total CK: 250 U/L — ABNORMAL HIGH (ref 7–232)
Total CK: 290 U/L — ABNORMAL HIGH (ref 7–232)
Total CK: 295 U/L — ABNORMAL HIGH (ref 7–232)
Troponin I: 0.38 ng/mL — ABNORMAL HIGH (ref 0.00–0.06)

## 2010-05-11 LAB — DIFFERENTIAL
Lymphocytes Relative: 17 % (ref 12–46)
Lymphs Abs: 2.8 10*3/uL (ref 0.7–4.0)
Neutrophils Relative %: 74 % (ref 43–77)

## 2010-05-11 LAB — LIPID PANEL
Cholesterol: 79 mg/dL (ref 0–200)
HDL: 36 mg/dL — ABNORMAL LOW (ref >39)
LDL Cholesterol: 35 mg/dL (ref 0–99)
Total CHOL/HDL Ratio: 2.2 RATIO
Triglycerides: 40 mg/dL (ref <150)

## 2010-05-11 LAB — COMPREHENSIVE METABOLIC PANEL
AST: 37 U/L (ref 0–37)
BUN: 4 mg/dL — ABNORMAL LOW (ref 6–23)
CO2: 25 mEq/L (ref 19–32)
CO2: 25 mEq/L (ref 19–32)
Calcium: 8.5 mg/dL (ref 8.4–10.5)
Calcium: 8.9 mg/dL (ref 8.4–10.5)
Chloride: 107 mEq/L (ref 96–112)
Creatinine, Ser: 0.46 mg/dL (ref 0.4–1.5)
Creatinine, Ser: 0.53 mg/dL (ref 0.4–1.5)
GFR calc Af Amer: >60 mL/min (ref >60)
GFR calc non Af Amer: >60 mL/min (ref >60)
GFR calc non Af Amer: >60 mL/min (ref >60)
Glucose, Bld: 100 mg/dL — ABNORMAL HIGH (ref 70–99)
Glucose, Bld: 135 mg/dL — ABNORMAL HIGH (ref 70–99)
Total Bilirubin: 5 mg/dL — ABNORMAL HIGH (ref 0.3–1.2)

## 2010-05-11 LAB — CULTURE, BLOOD (ROUTINE X 2)
Culture: NO GROWTH
Culture: NO GROWTH
Culture: NO GROWTH

## 2010-05-11 LAB — CBC
HCT: 26.3 % — ABNORMAL LOW (ref 39.0–52.0)
HCT: 28.2 % — ABNORMAL LOW (ref 39.0–52.0)
Hemoglobin: 9.6 g/dL — ABNORMAL LOW (ref 13.0–17.0)
Platelets: 444 10*3/uL — ABNORMAL HIGH (ref 150–400)
RBC: 2.59 MIL/uL — ABNORMAL LOW (ref 4.22–5.81)
WBC: 17.1 10*3/uL — ABNORMAL HIGH (ref 4.0–10.5)
WBC: 29.1 10*3/uL — ABNORMAL HIGH (ref 4.0–10.5)

## 2010-05-11 LAB — RAPID URINE DRUG SCREEN, HOSP PERFORMED
Amphetamines: NOT DETECTED
Benzodiazepines: NOT DETECTED
Cocaine: NOT DETECTED
Tetrahydrocannabinol: POSITIVE — AB

## 2010-05-11 LAB — POCT I-STAT, CHEM 8
BUN: 4 mg/dL — ABNORMAL LOW (ref 6–23)
Creatinine, Ser: 0.6 mg/dL (ref 0.4–1.5)
Hemoglobin: 10.2 g/dL — ABNORMAL LOW (ref 13.0–17.0)
Potassium: 3.5 mEq/L (ref 3.5–5.1)
Sodium: 143 mEq/L (ref 135–145)
TCO2: 23 mmol/L (ref 0–100)

## 2010-05-11 LAB — URINALYSIS, ROUTINE W REFLEX MICROSCOPIC
Glucose, UA: NEGATIVE mg/dL
Hgb urine dipstick: NEGATIVE
Protein, ur: NEGATIVE mg/dL
Specific Gravity, Urine: 1.009 (ref 1.005–1.030)
Urobilinogen, UA: 1 mg/dL (ref 0.0–1.0)

## 2010-05-11 LAB — TSH: TSH: 1.824 u[IU]/mL (ref 0.350–4.500)

## 2010-05-11 LAB — URINE CULTURE

## 2010-05-11 LAB — ETHANOL: Alcohol, Ethyl (B): <5 mg/dL (ref 0–10)

## 2010-05-11 LAB — PHOSPHORUS: Phosphorus: 3.1 mg/dL (ref 2.3–4.6)

## 2010-05-11 LAB — PROTIME-INR
INR: 1.2 (ref 0.00–1.49)
Prothrombin Time: 15.2 seconds (ref 11.6–15.2)

## 2010-05-11 LAB — CK TOTAL AND CKMB (NOT AT ARMC): Total CK: 225 U/L (ref 7–232)

## 2010-05-11 LAB — MAGNESIUM: Magnesium: 2 mg/dL (ref 1.5–2.5)

## 2010-05-11 LAB — RETICULOCYTES: Retic Count, Absolute: 245.1 10*3/uL — ABNORMAL HIGH (ref 19.0–186.0)

## 2010-05-12 LAB — COMPREHENSIVE METABOLIC PANEL
Albumin: 4.4 g/dL (ref 3.5–5.2)
BUN: 6 mg/dL (ref 6–23)
Chloride: 105 mEq/L (ref 96–112)
Creatinine, Ser: 0.53 mg/dL (ref 0.4–1.5)
Total Bilirubin: 4.9 mg/dL — ABNORMAL HIGH (ref 0.3–1.2)

## 2010-05-12 LAB — URINALYSIS, ROUTINE W REFLEX MICROSCOPIC
Bilirubin Urine: NEGATIVE
Ketones, ur: NEGATIVE mg/dL
Nitrite: NEGATIVE
Protein, ur: NEGATIVE mg/dL
Specific Gravity, Urine: 1.014 (ref 1.005–1.030)
Urobilinogen, UA: 2 mg/dL — ABNORMAL HIGH (ref 0.0–1.0)

## 2010-05-12 LAB — RETICULOCYTES
RBC.: 2.74 MIL/uL — ABNORMAL LOW (ref 4.22–5.81)
Retic Count, Absolute: 268.5 10*3/uL — ABNORMAL HIGH (ref 19.0–186.0)

## 2010-05-12 LAB — CBC
HCT: 26.7 % — ABNORMAL LOW (ref 39.0–52.0)
MCV: 101.9 fL — ABNORMAL HIGH (ref 78.0–100.0)
Platelets: 439 10*3/uL — ABNORMAL HIGH (ref 150–400)
RDW: 14.9 % (ref 11.5–15.5)
WBC: 14.6 10*3/uL — ABNORMAL HIGH (ref 4.0–10.5)

## 2010-05-13 LAB — DIFFERENTIAL
Basophils Absolute: 0 10*3/uL (ref 0.0–0.1)
Basophils Relative: 0 % (ref 0–1)
Basophils Relative: 0 % (ref 0–1)
Basophils Relative: 1 % (ref 0–1)
Basophils Relative: 0 % (ref 0–1)
Eosinophils Absolute: 0.5 10*3/uL (ref 0.0–0.7)
Eosinophils Absolute: 0.4 10*3/uL (ref 0.0–0.7)
Eosinophils Relative: 1 % (ref 0–5)
Eosinophils Relative: 3 % (ref 0–5)
Eosinophils Relative: 2 % (ref 0–5)
Lymphocytes Relative: 9 % — ABNORMAL LOW (ref 12–46)
Lymphs Abs: 1.6 10*3/uL (ref 0.7–4.0)
Lymphs Abs: 2.1 10*3/uL (ref 0.7–4.0)
Lymphs Abs: 3 10*3/uL (ref 0.7–4.0)
Monocytes Absolute: 0.9 10*3/uL (ref 0.1–1.0)
Monocytes Absolute: 1.6 10*3/uL — ABNORMAL HIGH (ref 0.1–1.0)
Monocytes Absolute: 1.6 10*3/uL — ABNORMAL HIGH (ref 0.1–1.0)
Monocytes Relative: 5 % (ref 3–12)
Monocytes Relative: 7 % (ref 3–12)
Monocytes Relative: 9 % (ref 3–12)
Neutro Abs: 14.4 10*3/uL — ABNORMAL HIGH (ref 1.7–7.7)
Neutrophils Relative %: 83 % — ABNORMAL HIGH (ref 43–77)

## 2010-05-13 LAB — URINALYSIS, ROUTINE W REFLEX MICROSCOPIC
Bilirubin Urine: NEGATIVE
Bilirubin Urine: NEGATIVE
Glucose, UA: NEGATIVE mg/dL
Glucose, UA: NEGATIVE mg/dL
Hgb urine dipstick: NEGATIVE
Hgb urine dipstick: NEGATIVE
Ketones, ur: NEGATIVE mg/dL
Protein, ur: NEGATIVE mg/dL
Specific Gravity, Urine: 1.012 (ref 1.005–1.030)
Specific Gravity, Urine: 1.018 (ref 1.005–1.030)
Urobilinogen, UA: 1 mg/dL (ref 0.0–1.0)
Urobilinogen, UA: 1 mg/dL (ref 0.0–1.0)
pH: 7 (ref 5.0–8.0)
pH: 6 (ref 5.0–8.0)

## 2010-05-13 LAB — COMPREHENSIVE METABOLIC PANEL
ALT: 28 U/L (ref 0–53)
ALT: 31 U/L (ref 0–53)
ALT: 25 U/L (ref 0–53)
AST: 28 U/L (ref 0–37)
AST: 34 U/L (ref 0–37)
AST: 28 U/L (ref 0–37)
Albumin: 3.9 g/dL (ref 3.5–5.2)
Albumin: 4.1 g/dL (ref 3.5–5.2)
Albumin: 4.4 g/dL (ref 3.5–5.2)
Alkaline Phosphatase: 100 U/L (ref 39–117)
Alkaline Phosphatase: 108 U/L (ref 39–117)
BUN: 4 mg/dL — ABNORMAL LOW (ref 6–23)
CO2: 24 mEq/L (ref 19–32)
Calcium: 8.8 mg/dL (ref 8.4–10.5)
Calcium: 9 mg/dL (ref 8.4–10.5)
Chloride: 106 mEq/L (ref 96–112)
Chloride: 103 mEq/L (ref 96–112)
Creatinine, Ser: 0.49 mg/dL (ref 0.4–1.5)
GFR calc Af Amer: >60 mL/min (ref >60)
GFR calc Af Amer: >60 mL/min (ref >60)
GFR calc non Af Amer: >60 mL/min (ref >60)
Glucose, Bld: 86 mg/dL (ref 70–99)
Glucose, Bld: 92 mg/dL (ref 70–99)
Potassium: 3.4 mEq/L — ABNORMAL LOW (ref 3.5–5.1)
Potassium: 3.9 mEq/L (ref 3.5–5.1)
Potassium: 3.2 mEq/L — ABNORMAL LOW (ref 3.5–5.1)
Sodium: 136 mEq/L (ref 135–145)
Sodium: 138 mEq/L (ref 135–145)
Sodium: 137 mEq/L (ref 135–145)
Total Bilirubin: 3.8 mg/dL — ABNORMAL HIGH (ref 0.3–1.2)
Total Bilirubin: 4.1 mg/dL — ABNORMAL HIGH (ref 0.3–1.2)
Total Protein: 6.7 g/dL (ref 6.0–8.3)
Total Protein: 7.2 g/dL (ref 6.0–8.3)
Total Protein: 7.6 g/dL (ref 6.0–8.3)

## 2010-05-13 LAB — CULTURE, BLOOD (ROUTINE X 2)
Culture: NO GROWTH
Culture: NO GROWTH
Culture: NO GROWTH

## 2010-05-13 LAB — URINE CULTURE
Colony Count: NO GROWTH
Colony Count: 15000

## 2010-05-13 LAB — RETICULOCYTES
Retic Count, Absolute: 283.9 10*3/uL — ABNORMAL HIGH (ref 19.0–186.0)
Retic Count, Absolute: 230.5 10*3/uL — ABNORMAL HIGH (ref 19.0–186.0)

## 2010-05-13 LAB — CBC
HCT: 26.6 % — ABNORMAL LOW (ref 39.0–52.0)
Hemoglobin: 9 g/dL — ABNORMAL LOW (ref 13.0–17.0)
Hemoglobin: 9.1 g/dL — ABNORMAL LOW (ref 13.0–17.0)
MCHC: 33.6 g/dL (ref 30.0–36.0)
MCHC: 33.8 g/dL (ref 30.0–36.0)
MCV: 103.3 fL — ABNORMAL HIGH (ref 78.0–100.0)
Platelets: 512 10*3/uL — ABNORMAL HIGH (ref 150–400)
Platelets: 530 10*3/uL — ABNORMAL HIGH (ref 150–400)
Platelets: 540 10*3/uL — ABNORMAL HIGH (ref 150–400)
RBC: 2.57 MIL/uL — ABNORMAL LOW (ref 4.22–5.81)
RBC: 2.6 MIL/uL — ABNORMAL LOW (ref 4.22–5.81)
RDW: 16.3 % — ABNORMAL HIGH (ref 11.5–15.5)
RDW: 16.8 % — ABNORMAL HIGH (ref 11.5–15.5)
RDW: 17 % — ABNORMAL HIGH (ref 11.5–15.5)
WBC: 17.5 10*3/uL — ABNORMAL HIGH (ref 4.0–10.5)
WBC: 20 10*3/uL — ABNORMAL HIGH (ref 4.0–10.5)

## 2010-05-13 LAB — POCT I-STAT, CHEM 8
Calcium, Ion: 1.12 mmol/L (ref 1.12–1.32)
Chloride: 103 mEq/L (ref 96–112)
HCT: 30 % — ABNORMAL LOW (ref 39.0–52.0)
Sodium: 140 mEq/L (ref 135–145)
TCO2: 25 mmol/L (ref 0–100)

## 2010-05-13 LAB — PROTIME-INR: Prothrombin Time: 14.7 seconds (ref 11.6–15.2)

## 2010-05-13 LAB — TYPE AND SCREEN

## 2010-05-13 LAB — URINE MICROSCOPIC-ADD ON

## 2010-05-13 LAB — POCT CARDIAC MARKERS
CKMB, poc: <1 ng/mL — ABNORMAL LOW (ref 1.0–8.0)
Troponin i, poc: <0.05 ng/mL (ref 0.00–0.09)
Troponin i, poc: <0.05 ng/mL (ref 0.00–0.09)

## 2010-05-16 ENCOUNTER — Emergency Department (HOSPITAL_COMMUNITY)
Admission: EM | Admit: 2010-05-16 | Discharge: 2010-05-17 | Disposition: A | Discharge status: Home / Self Care | Payer: Medicare Other | Attending: Emergency Medicine | Admitting: Emergency Medicine

## 2010-05-16 ENCOUNTER — Emergency Department (HOSPITAL_COMMUNITY): Payer: Medicare Other

## 2010-05-16 DIAGNOSIS — R079 Chest pain, unspecified: Secondary | ICD-10-CM | POA: Insufficient documentation

## 2010-05-16 DIAGNOSIS — Z86718 Personal history of other venous thrombosis and embolism: Secondary | ICD-10-CM | POA: Insufficient documentation

## 2010-05-16 DIAGNOSIS — D5700 Hb-SS disease with crisis, unspecified: Secondary | ICD-10-CM | POA: Insufficient documentation

## 2010-05-16 LAB — RETICULOCYTES
RBC.: 3 MIL/uL — ABNORMAL LOW (ref 4.22–5.81)
Retic Count, Absolute: 360 10*3/uL — ABNORMAL HIGH (ref 19.0–186.0)

## 2010-05-16 LAB — DIFFERENTIAL
Basophils Absolute: 0.1 10*3/uL (ref 0.0–0.1)
Eosinophils Absolute: 0.7 10*3/uL (ref 0.0–0.7)
Eosinophils Relative: 3 % (ref 0–5)

## 2010-05-16 LAB — COMPREHENSIVE METABOLIC PANEL
Albumin: 4.3 g/dL (ref 3.5–5.2)
BUN: 4 mg/dL — ABNORMAL LOW (ref 6–23)
Creatinine, Ser: 0.73 mg/dL (ref 0.4–1.5)
Total Bilirubin: 3.5 mg/dL — ABNORMAL HIGH (ref 0.3–1.2)
Total Protein: 7.7 g/dL (ref 6.0–8.3)

## 2010-05-16 LAB — CBC
Platelets: 574 10*3/uL — ABNORMAL HIGH (ref 150–400)
RDW: 17.1 % — ABNORMAL HIGH (ref 11.5–15.5)
WBC: 21.9 10*3/uL — ABNORMAL HIGH (ref 4.0–10.5)

## 2010-05-17 LAB — COMPREHENSIVE METABOLIC PANEL
ALT: 26 U/L (ref 0–53)
Alkaline Phosphatase: 111 U/L (ref 39–117)
CO2: 25 mEq/L (ref 19–32)
Calcium: 8.8 mg/dL (ref 8.4–10.5)
Chloride: 99 mEq/L (ref 96–112)
GFR calc non Af Amer: >60 mL/min (ref >60)
Glucose, Bld: 106 mg/dL — ABNORMAL HIGH (ref 70–99)
Sodium: 137 mEq/L (ref 135–145)
Total Bilirubin: 4.6 mg/dL — ABNORMAL HIGH (ref 0.3–1.2)

## 2010-05-17 LAB — CBC
Hemoglobin: 9.6 g/dL — ABNORMAL LOW (ref 13.0–17.0)
MCHC: 34 g/dL (ref 30.0–36.0)
MCV: 98.6 fL (ref 78.0–100.0)
RBC: 2.85 MIL/uL — ABNORMAL LOW (ref 4.22–5.81)

## 2010-05-17 LAB — DIFFERENTIAL
Basophils Absolute: 0 10*3/uL (ref 0.0–0.1)
Basophils Relative: 0 % (ref 0–1)
Eosinophils Absolute: 0.5 10*3/uL (ref 0.0–0.7)
Lymphs Abs: 3.4 10*3/uL (ref 0.7–4.0)
Neutrophils Relative %: 75 % (ref 43–77)

## 2010-05-17 LAB — RETICULOCYTES
RBC.: 2.79 MIL/uL — ABNORMAL LOW (ref 4.22–5.81)
Retic Ct Pct: 7.4 % — ABNORMAL HIGH (ref 0.4–3.1)

## 2010-05-25 IMAGING — CR DG CHEST 2V
2 series · 2 of 2 positions shown · non-contrast
Comparison: 05/10/2007

CLINICAL DATA: Sickle cell crisis.  Chest pain.

CHEST - 2 VIEW

[w chest pa]
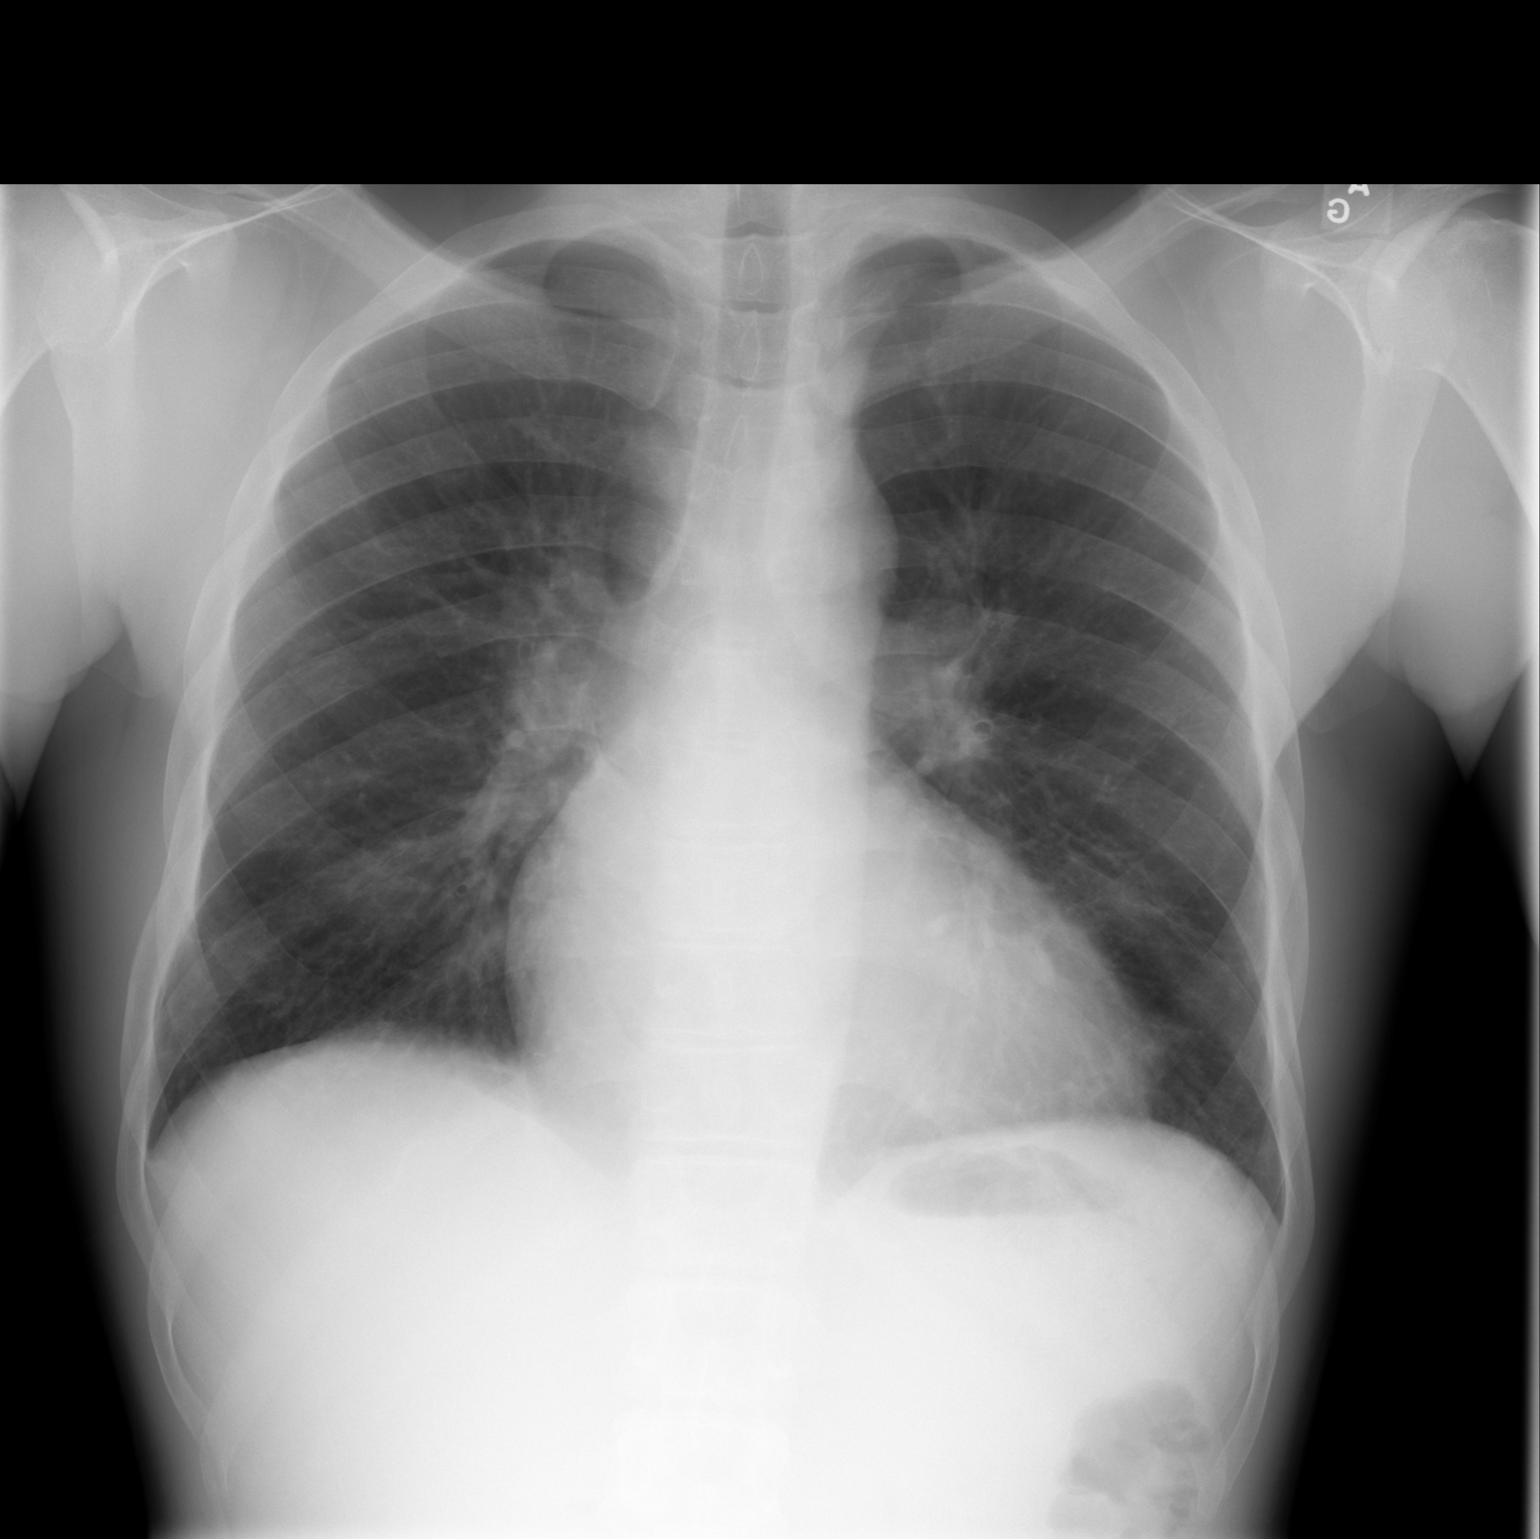

[w chest lat]
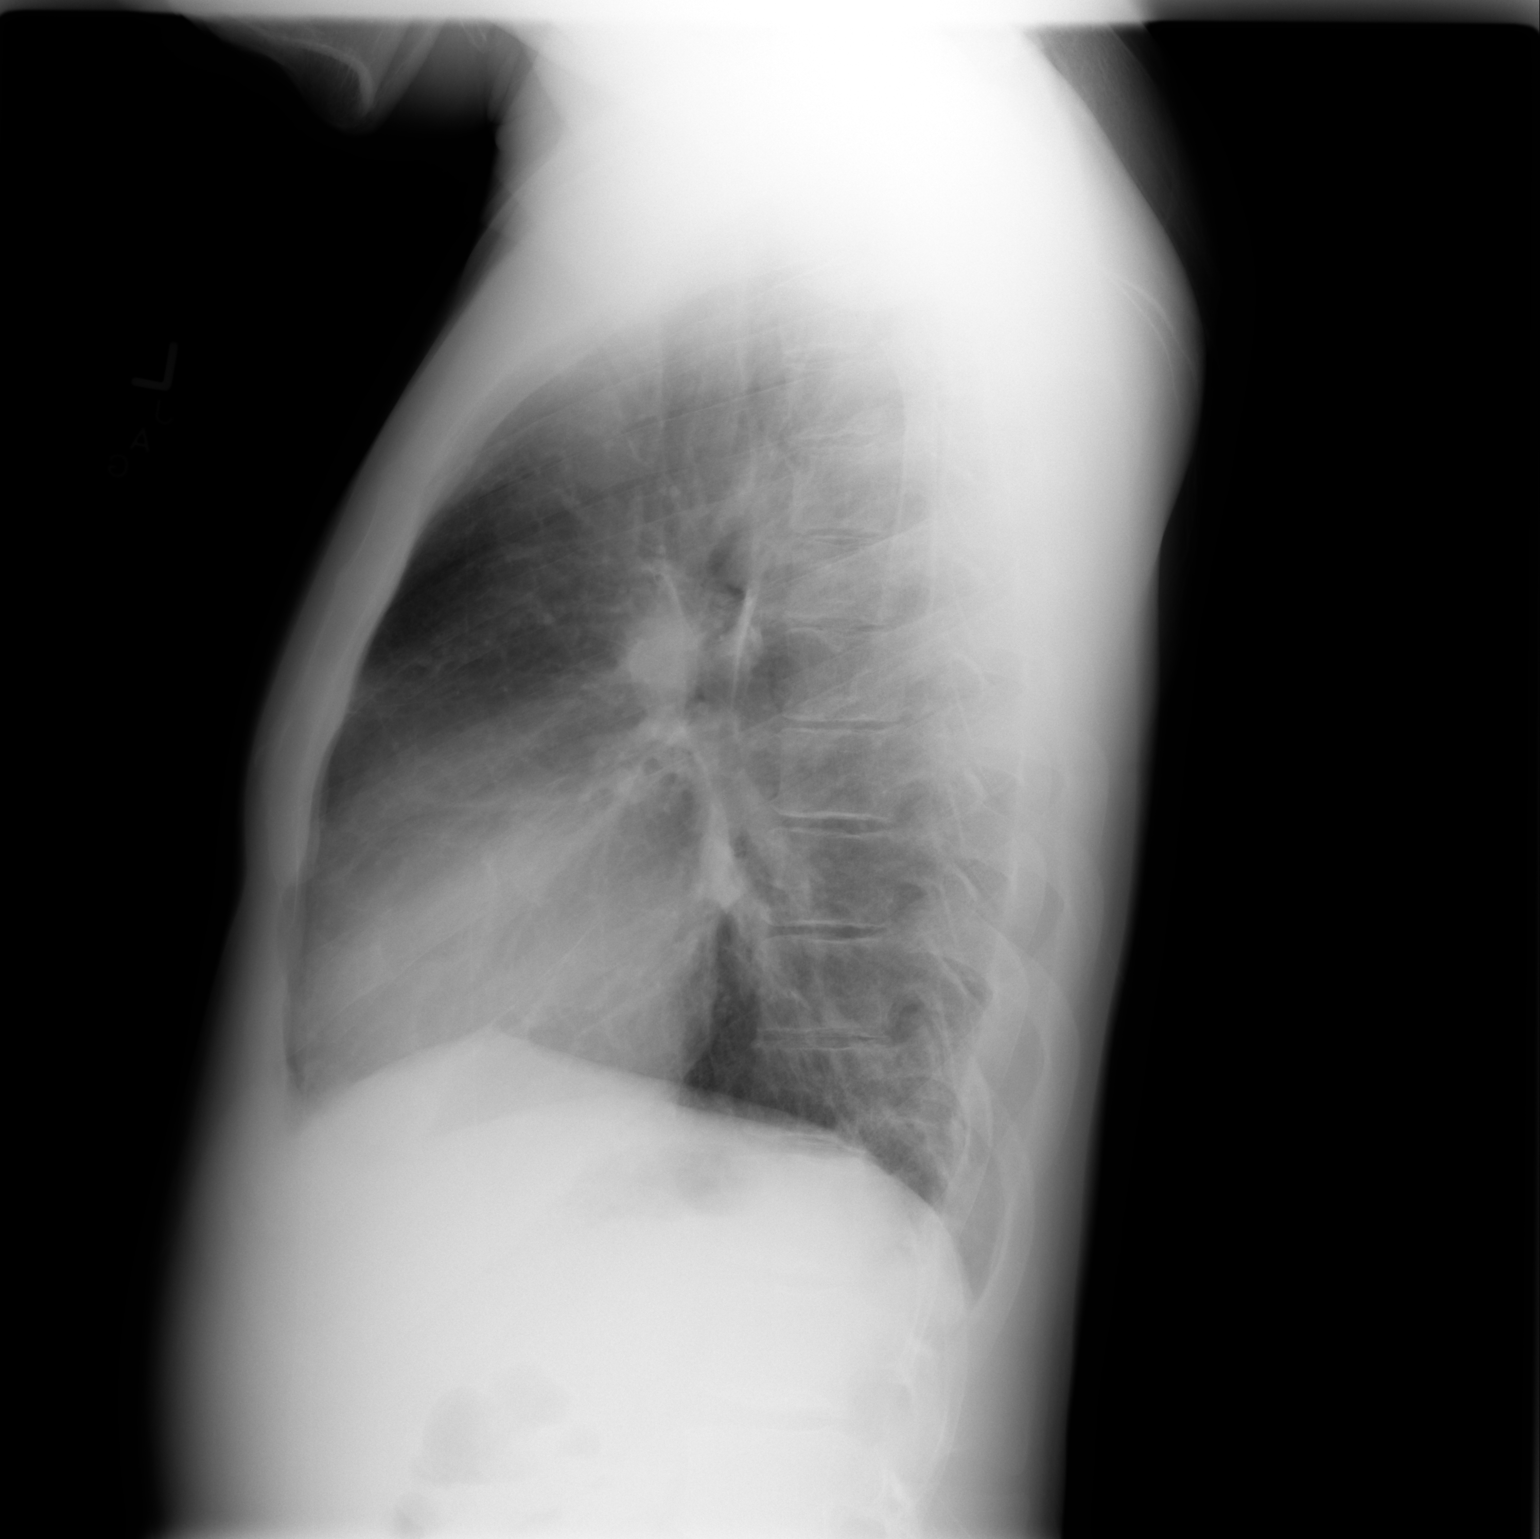

[2 of 2 positions shown; findings below may reference images not displayed]

FINDINGS: The heart is enlarged.  The pulmonary vascularity appears
within normal limits.  Lungs are clear.  No effusions.  No acute
bony finding.
IMPRESSION: Cardiomegaly.  No acute finding.

## 2010-05-31 ENCOUNTER — Inpatient Hospital Stay (HOSPITAL_COMMUNITY)
Admission: EM | Admit: 2010-05-31 | Discharge: 2010-06-11 | DRG: 812 | Disposition: A | Discharge status: Home / Self Care | Payer: Medicare Other | Attending: Internal Medicine | Admitting: Internal Medicine

## 2010-05-31 ENCOUNTER — Emergency Department (HOSPITAL_COMMUNITY): Payer: Medicare Other

## 2010-05-31 DIAGNOSIS — E876 Hypokalemia: Secondary | ICD-10-CM | POA: Diagnosis present

## 2010-05-31 DIAGNOSIS — Z8614 Personal history of Methicillin resistant Staphylococcus aureus infection: Secondary | ICD-10-CM

## 2010-05-31 DIAGNOSIS — F411 Generalized anxiety disorder: Secondary | ICD-10-CM | POA: Diagnosis present

## 2010-05-31 DIAGNOSIS — M87029 Idiopathic aseptic necrosis of unspecified humerus: Secondary | ICD-10-CM | POA: Diagnosis present

## 2010-05-31 DIAGNOSIS — J209 Acute bronchitis, unspecified: Secondary | ICD-10-CM | POA: Diagnosis present

## 2010-05-31 DIAGNOSIS — D5700 Hb-SS disease with crisis, unspecified: Principal | ICD-10-CM | POA: Diagnosis present

## 2010-05-31 DIAGNOSIS — Z86711 Personal history of pulmonary embolism: Secondary | ICD-10-CM

## 2010-05-31 DIAGNOSIS — G894 Chronic pain syndrome: Secondary | ICD-10-CM | POA: Diagnosis present

## 2010-05-31 LAB — RETICULOCYTES
RBC.: 2.73 MIL/uL — ABNORMAL LOW (ref 4.22–5.81)
Retic Ct Pct: 11.3 % — ABNORMAL HIGH (ref 0.4–3.1)

## 2010-05-31 LAB — DIFFERENTIAL
Band Neutrophils: 3 % (ref 0–10)
Basophils Absolute: 0.2 10*3/uL — ABNORMAL HIGH (ref 0.0–0.1)
Basophils Relative: 1 % (ref 0–1)
Eosinophils Absolute: 2.5 10*3/uL — ABNORMAL HIGH (ref 0.0–0.7)
Eosinophils Relative: 11 % — ABNORMAL HIGH (ref 0–5)
Metamyelocytes Relative: 0 %
Myelocytes: 0 %
Promyelocytes Absolute: 0 %

## 2010-05-31 LAB — BASIC METABOLIC PANEL
BUN: 5 mg/dL — ABNORMAL LOW (ref 6–23)
CO2: 21 mEq/L (ref 19–32)
Calcium: 9.1 mg/dL (ref 8.4–10.5)
Glucose, Bld: 102 mg/dL — ABNORMAL HIGH (ref 70–99)
Sodium: 139 mEq/L (ref 135–145)

## 2010-05-31 LAB — CBC
Hemoglobin: 8.1 g/dL — ABNORMAL LOW (ref 13.0–17.0)
Platelets: 672 10*3/uL — ABNORMAL HIGH (ref 150–400)
RBC: 2.73 MIL/uL — ABNORMAL LOW (ref 4.22–5.81)

## 2010-05-31 NOTE — H&P (Signed)
NAMEGLENROY, [name]             ACCOUNT NO.:  [id number]  MEDICAL RECORD NO.:  [id number]           PATIENT TYPE:  E  LOCATION:  WLED                         FACILITY:  [facility]  PHYSICIAN:  Lonia Blood, M.D.DATE OF BIRTH:  07/24/1980  DATE OF ADMISSION:  05/31/2010 DATE OF DISCHARGE:                             HISTORY & PHYSICAL   PRIMARY CARE PHYSICIAN:  Lorelle Formosa, M.D.  CHIEF COMPLAINT:  Severe pain in ribs and low back, consistent with previous sickle cell crises.  HISTORY OF PRESENT ILLNESS:  [name] is a 30 year old gentleman with known sickle cell disease, who has required numerous prior admissions for sickle cell crises.  He states that he began to develop severe pain in his low ribs and low back yesterday.  He attempts to control this at home with p.o. Dilaudid, but this failed.  As a result, he reported to the emergency room.  The emergency room physician has treated the patient at the [facility] ER for approximately six and half hours.  There have been times with the pain improved but then it quickly returned.  As a result, the patient is severely concerned about being discharged home and the hospitalist team is called for admission.  At the time of my evaluation, the patient is reporting severe uncontrolled pain in his low ribs and low back.  He states that this feels exactly like his previous pain.  It is notable, the patient has been evaluated in the emergency room 23 times since the beginning of 2012.  The patient denies any trouble with his breathing.  There is no shortness of breath.  There is no specific chest pain, but rather lower rib pain.  He denies hemoptysis or hematemesis.  He denies fevers or chills.  There has been no nausea or vomiting.  There has been hematochezia or melena.  The patient suffered no other joint aches and no acute visual changes.  There is no headache.  REVIEW OF SYSTEMS:  An 11-point review of systems  is complete and is unrevealing with the exception to the positive elements noted in the history of present illness above.  PAST MEDICAL HISTORY: 1. Hemoglobin SS sickle cell disease, with frequent painful crises,     usually involving ribs and low back. 2. History of pulmonary embolism, approximately 4 years ago -     completed a full course of Coumadin therapy. 3. Known avascular necrosis of the left hip and left shoulder. 4. Chronic leukocytosis and thrombocytosis. 5. Status post auto-infarct of the spleen. 6. Status post cholecystectomy. 7. Status post umbilical hernia repair. 8. Anxiety disorder.  OUTPATIENT MEDICATIONS: 1. Dilaudid 4 mg q.4 h p.r.n. 2. Ibuprofen 800 mg t.i.d. p.r.n. 3. Ambien 10 mg q.h.s. 4. Zyprexa 5 mg q.h.s. 5. Folic acid 1 mg daily. 6. Hydrea 500 mg b.i.d. 7. Potassium chloride 20 mEq daily. 8. OxyContin 40 mg b.i.d.  ALLERGIES:  MORPHINE.  FAMILY HISTORY:  The patient's mother has diabetes.  The patient's father died of a gunshot wound at the age of 63.  SOCIAL HISTORY:  The patient occasionally smokes a cigar.  He occasionally takes  alcohol but not regularly and not to excess.  He denies illicit drugs.  He is single.  LABORATORY DATA:  Data reviewed, actually white count is 23,000 which is elevated, platelet count is elevated at 672,000, hemoglobin is 8.1, potassium is low at 3.0.  Sodium, chloride, bicarb, BUN, creatinine, serum glucose, and calcium are normal.  Absolute reticulocyte count is markedly elevated at 309.  PHYSICAL EXAMINATION:  VITAL SIGNS:  Temperature 98.4, blood pressure 127/82, heart rate 80, respiratory rate 18, O2 sats 99% on room air. GENERAL:  Thin, frail-appearing gentleman, in no acute respiratory distress. HEENT:  Normocephalic, atraumatic.  Pupils equal, round, but constricted, consistent with narcotic pain medication administration and minimally reactive as a result.  Sclerae are icteric. NECK:  No JVD. LUNGS:   Clear to auscultation bilaterally, without wheezes or rhonchi. CARDIOVASCULAR:  Regular rate and rhythm, without murmur, gallop, or rub with normal S1 and S2. ABDOMEN:  Nontender, nondistended.  Soft bowel sounds present.  No organomegaly, no rebound, or no ascites. EXTREMITIES:  No significant cyanosis, clubbing, edema in bilateral lower extremities. NEUROLOGIC:  Nonfocal neurologic exam.  Alert and oriented x4.  Cranial nerves II through XII intact bilaterally.  IMPRESSION AND PLAN: 1. Vasoocclusive acute sickle cell crisis - the patient will be     admitted to the acute unit under observation status.  We will     hydrate him using hypotonic D5 saline solution.  We will administer     pain medication on as needed basis.  We will consult Dr. [name] in     the morning to consider assuming care of this patient.  We will     continue his Hydrea and other his supplemental medications to     include folic acid. 2. Hypokalemia - this is a recurring issue for this patient.  He has     been loaded in the emergency room.  We will double his usual dosage     and follow his potassium levels.  We will consider recheck in his     potassium in approximate 48 hours. 3. Anxiety disorder - we will continue the Zyprexa as initiated by Dr.     [name] during the patient's recent hospital stay. 4. Chronic leukocytosis and thrombocytosis - these appear to be stable     at the present time.     Lonia Blood, M.D.     JTM/MEDQ  D:  05/31/2010  T:  05/31/2010  Job:  [id number]  cc:   Lorelle Formosa, M.D. Fax: [phone]  Electronically Signed by Jetty Duhamel M.D. on 05/31/2010 06:53:03 PM

## 2010-06-01 ENCOUNTER — Other Ambulatory Visit (HOSPITAL_COMMUNITY): Payer: Medicare Other

## 2010-06-01 ENCOUNTER — Observation Stay (HOSPITAL_COMMUNITY): Payer: Medicare Other

## 2010-06-01 LAB — COMPREHENSIVE METABOLIC PANEL
BUN: 5 mg/dL — ABNORMAL LOW (ref 6–23)
Calcium: 9 mg/dL (ref 8.4–10.5)
Glucose, Bld: 99 mg/dL (ref 70–99)
Total Protein: 8.1 g/dL (ref 6.0–8.3)

## 2010-06-01 LAB — DIFFERENTIAL
Band Neutrophils: 4 % (ref 0–10)
Basophils Absolute: 0 10*3/uL (ref 0.0–0.1)
Basophils Relative: 0 % (ref 0–1)
Eosinophils Absolute: 0.7 10*3/uL (ref 0.0–0.7)
Eosinophils Relative: 3 % (ref 0–5)
Lymphs Abs: 4.3 10*3/uL — ABNORMAL HIGH (ref 0.7–4.0)
Myelocytes: 0 %
Neutro Abs: 18.3 10*3/uL — ABNORMAL HIGH (ref 1.7–7.7)
Neutrophils Relative %: 72 % (ref 43–77)

## 2010-06-01 LAB — RETICULOCYTES
RBC.: 2.5 MIL/uL — ABNORMAL LOW (ref 4.22–5.81)
Retic Ct Pct: 13.1 % — ABNORMAL HIGH (ref 0.4–3.1)

## 2010-06-01 LAB — BILIRUBIN, FRACTIONATED(TOT/DIR/INDIR)
Bilirubin, Direct: 0.4 mg/dL — ABNORMAL HIGH (ref 0.0–0.3)
Indirect Bilirubin: 4.3 mg/dL — ABNORMAL HIGH (ref 0.3–0.9)
Total Bilirubin: 4.7 mg/dL — ABNORMAL HIGH (ref 0.3–1.2)

## 2010-06-01 LAB — MAGNESIUM: Magnesium: 1.8 mg/dL (ref 1.5–2.5)

## 2010-06-01 LAB — URINALYSIS, ROUTINE W REFLEX MICROSCOPIC
Ketones, ur: NEGATIVE mg/dL
Nitrite: NEGATIVE
Protein, ur: NEGATIVE mg/dL

## 2010-06-01 LAB — CBC
Hemoglobin: 7.5 g/dL — ABNORMAL LOW (ref 13.0–17.0)
MCHC: 33.3 g/dL (ref 30.0–36.0)
RDW: 16.8 % — ABNORMAL HIGH (ref 11.5–15.5)
WBC: 24 10*3/uL — ABNORMAL HIGH (ref 4.0–10.5)

## 2010-06-02 ENCOUNTER — Observation Stay (HOSPITAL_COMMUNITY): Payer: Medicare Other

## 2010-06-02 LAB — CBC
MCV: 89.1 fL (ref 78.0–100.0)
Platelets: 586 10*3/uL — ABNORMAL HIGH (ref 150–400)
RDW: 17.7 % — ABNORMAL HIGH (ref 11.5–15.5)
WBC: 27.4 10*3/uL — ABNORMAL HIGH (ref 4.0–10.5)

## 2010-06-02 LAB — HEPATIC FUNCTION PANEL
ALT: 19 U/L (ref 0–53)
Alkaline Phosphatase: 114 U/L (ref 39–117)
Bilirubin, Direct: 0.7 mg/dL — ABNORMAL HIGH (ref 0.0–0.3)
Indirect Bilirubin: 4.2 mg/dL — ABNORMAL HIGH (ref 0.3–0.9)
Total Bilirubin: 4.9 mg/dL — ABNORMAL HIGH (ref 0.3–1.2)

## 2010-06-02 LAB — DIFFERENTIAL
Eosinophils Absolute: 1.8 10*3/uL — ABNORMAL HIGH (ref 0.0–0.7)
Eosinophils Relative: 6 % — ABNORMAL HIGH (ref 0–5)
Lymphs Abs: 4.4 10*3/uL — ABNORMAL HIGH (ref 0.7–4.0)

## 2010-06-02 LAB — RETICULOCYTES: Retic Count, Absolute: 347.2 10*3/uL — ABNORMAL HIGH (ref 19.0–186.0)

## 2010-06-03 LAB — COMPREHENSIVE METABOLIC PANEL
ALT: 22 U/L (ref 0–53)
Alkaline Phosphatase: 113 U/L (ref 39–117)
BUN: 8 mg/dL (ref 6–23)
Chloride: 107 mEq/L (ref 96–112)
Glucose, Bld: 108 mg/dL — ABNORMAL HIGH (ref 70–99)
Potassium: 4.2 mEq/L (ref 3.5–5.1)
Sodium: 138 mEq/L (ref 135–145)
Total Bilirubin: 5.8 mg/dL — ABNORMAL HIGH (ref 0.3–1.2)

## 2010-06-03 LAB — CBC
HCT: 19.7 % — ABNORMAL LOW (ref 39.0–52.0)
Hemoglobin: 6.8 g/dL — CL (ref 13.0–17.0)
MCV: 87.6 fL (ref 78.0–100.0)
RBC: 2.25 MIL/uL — ABNORMAL LOW (ref 4.22–5.81)
WBC: 25.8 10*3/uL — ABNORMAL HIGH (ref 4.0–10.5)

## 2010-06-04 LAB — CBC
HCT: 20.7 % — ABNORMAL LOW (ref 39.0–52.0)
Hemoglobin: 7.3 g/dL — ABNORMAL LOW (ref 13.0–17.0)
MCHC: 35.3 g/dL (ref 30.0–36.0)
RBC: 2.37 MIL/uL — ABNORMAL LOW (ref 4.22–5.81)

## 2010-06-04 LAB — COMPREHENSIVE METABOLIC PANEL
ALT: 26 U/L (ref 0–53)
AST: 41 U/L — ABNORMAL HIGH (ref 0–37)
Alkaline Phosphatase: 109 U/L (ref 39–117)
CO2: 21 mEq/L (ref 19–32)
Calcium: 8.9 mg/dL (ref 8.4–10.5)
Chloride: 107 mEq/L (ref 96–112)
Glucose, Bld: 111 mg/dL — ABNORMAL HIGH (ref 70–99)
Potassium: 4.3 mEq/L (ref 3.5–5.1)
Sodium: 139 mEq/L (ref 135–145)
Total Bilirubin: 4.2 mg/dL — ABNORMAL HIGH (ref 0.3–1.2)

## 2010-06-05 ENCOUNTER — Inpatient Hospital Stay (HOSPITAL_COMMUNITY): Payer: Medicare Other

## 2010-06-05 LAB — CBC
HCT: 21.1 % — ABNORMAL LOW (ref 39.0–52.0)
MCHC: 34.6 g/dL (ref 30.0–36.0)
Platelets: 530 10*3/uL — ABNORMAL HIGH (ref 150–400)
RDW: 21.5 % — ABNORMAL HIGH (ref 11.5–15.5)
WBC: 25.3 10*3/uL — ABNORMAL HIGH (ref 4.0–10.5)

## 2010-06-05 LAB — COMPREHENSIVE METABOLIC PANEL
ALT: 33 U/L (ref 0–53)
Albumin: 4.4 g/dL (ref 3.5–5.2)
BUN: 7 mg/dL (ref 6–23)
Calcium: 9.2 mg/dL (ref 8.4–10.5)
Glucose, Bld: 129 mg/dL — ABNORMAL HIGH (ref 70–99)
Sodium: 136 mEq/L (ref 135–145)
Total Protein: 8.3 g/dL (ref 6.0–8.3)

## 2010-06-05 LAB — FERRITIN: Ferritin: 1630 ng/mL — ABNORMAL HIGH (ref 22–322)

## 2010-06-05 LAB — AMMONIA: Ammonia: 66 umol/L — ABNORMAL HIGH (ref 11–60)

## 2010-06-06 LAB — CROSSMATCH
ABO/RH(D): O POS
Antibody Screen: NEGATIVE
Unit division: 0
Unit division: 0

## 2010-06-07 LAB — BASIC METABOLIC PANEL
BUN: 7 mg/dL (ref 6–23)
CO2: 25 mEq/L (ref 19–32)
Glucose, Bld: 112 mg/dL — ABNORMAL HIGH (ref 70–99)
Potassium: 4.2 mEq/L (ref 3.5–5.1)
Sodium: 135 mEq/L (ref 135–145)

## 2010-06-07 LAB — CULTURE, BLOOD (ROUTINE X 2)
Culture  Setup Time: 201205012303
Culture: NO GROWTH

## 2010-06-07 LAB — CBC
HCT: 22.4 % — ABNORMAL LOW (ref 39.0–52.0)
Hemoglobin: 7.6 g/dL — ABNORMAL LOW (ref 13.0–17.0)
MCV: 89.6 fL (ref 78.0–100.0)
WBC: 19.6 10*3/uL — ABNORMAL HIGH (ref 4.0–10.5)

## 2010-06-08 LAB — CBC
HCT: 21 % — ABNORMAL LOW (ref 39.0–52.0)
Hemoglobin: 7 g/dL — ABNORMAL LOW (ref 13.0–17.0)
MCH: 30.2 pg (ref 26.0–34.0)
MCHC: 33.3 g/dL (ref 30.0–36.0)
MCV: 90.5 fL (ref 78.0–100.0)

## 2010-06-08 LAB — COMPREHENSIVE METABOLIC PANEL
ALT: 38 U/L (ref 0–53)
BUN: 8 mg/dL (ref 6–23)
CO2: 24 mEq/L (ref 19–32)
Calcium: 8.8 mg/dL (ref 8.4–10.5)
Creatinine, Ser: 0.54 mg/dL (ref 0.4–1.5)
GFR calc non Af Amer: >60 mL/min (ref >60)
Glucose, Bld: 254 mg/dL — ABNORMAL HIGH (ref 70–99)
Sodium: 135 mEq/L (ref 135–145)

## 2010-06-08 LAB — AMMONIA: Ammonia: 68 umol/L — ABNORMAL HIGH (ref 11–60)

## 2010-06-09 ENCOUNTER — Inpatient Hospital Stay (HOSPITAL_COMMUNITY): Payer: Medicare Other

## 2010-06-09 LAB — COMPREHENSIVE METABOLIC PANEL
ALT: 37 U/L (ref 0–53)
AST: 45 U/L — ABNORMAL HIGH (ref 0–37)
CO2: 23 mEq/L (ref 19–32)
Calcium: 9.2 mg/dL (ref 8.4–10.5)
GFR calc Af Amer: >60 mL/min (ref >60)
GFR calc non Af Amer: >60 mL/min (ref >60)
Sodium: 136 mEq/L (ref 135–145)
Total Protein: 7.7 g/dL (ref 6.0–8.3)

## 2010-06-09 LAB — CBC
Hemoglobin: 7.1 g/dL — ABNORMAL LOW (ref 13.0–17.0)
MCHC: 34 g/dL (ref 30.0–36.0)
WBC: 22.5 10*3/uL — ABNORMAL HIGH (ref 4.0–10.5)

## 2010-06-09 LAB — CROSSMATCH
Antibody Screen: NEGATIVE
Unit division: 0

## 2010-06-10 LAB — COMPREHENSIVE METABOLIC PANEL
ALT: 42 U/L (ref 0–53)
AST: 52 U/L — ABNORMAL HIGH (ref 0–37)
Calcium: 9.4 mg/dL (ref 8.4–10.5)
GFR calc Af Amer: >60 mL/min (ref >60)
Sodium: 136 mEq/L (ref 135–145)
Total Protein: 8 g/dL (ref 6.0–8.3)

## 2010-06-10 LAB — PREPARE RBC (CROSSMATCH)

## 2010-06-10 LAB — CBC
HCT: 20 % — ABNORMAL LOW (ref 39.0–52.0)
MCHC: 33 g/dL (ref 30.0–36.0)
MCHC: 32.8 g/dL (ref 30.0–36.0)
MCV: 92.6 fL (ref 78.0–100.0)
RDW: 20.1 % — ABNORMAL HIGH (ref 11.5–15.5)
RDW: 19.8 % — ABNORMAL HIGH (ref 11.5–15.5)

## 2010-06-11 LAB — COMPREHENSIVE METABOLIC PANEL WITH GFR
ALT: 49 U/L (ref 0–53)
AST: 60 U/L — ABNORMAL HIGH (ref 0–37)
Albumin: 4.5 g/dL (ref 3.5–5.2)
Alkaline Phosphatase: 138 U/L — ABNORMAL HIGH (ref 39–117)
BUN: 7 mg/dL (ref 6–23)
CO2: 25 meq/L (ref 19–32)
Calcium: 8.9 mg/dL (ref 8.4–10.5)
Chloride: 102 meq/L (ref 96–112)
Creatinine, Ser: <0.47 mg/dL (ref 0.4–1.5)
Glucose, Bld: 115 mg/dL — ABNORMAL HIGH (ref 70–99)
Potassium: 4.2 meq/L (ref 3.5–5.1)
Sodium: 136 meq/L (ref 135–145)
Total Bilirubin: 3.3 mg/dL — ABNORMAL HIGH (ref 0.3–1.2)
Total Protein: 7.8 g/dL (ref 6.0–8.3)

## 2010-06-11 LAB — CBC
HCT: 24.5 % — ABNORMAL LOW (ref 39.0–52.0)
Hemoglobin: 7.9 g/dL — ABNORMAL LOW (ref 13.0–17.0)
MCH: 30.5 pg (ref 26.0–34.0)
MCHC: 33.1 g/dL (ref 30.0–36.0)
MCV: 92.1 fL (ref 78.0–100.0)
Platelets: 606 K/uL — ABNORMAL HIGH (ref 150–400)
RBC: 2.66 MIL/uL — ABNORMAL LOW (ref 4.22–5.81)
RDW: 19.8 % — ABNORMAL HIGH (ref 11.5–15.5)
WBC: 18.9 K/uL — ABNORMAL HIGH (ref 4.0–10.5)

## 2010-06-11 LAB — TYPE AND SCREEN: Unit division: 0

## 2010-06-15 NOTE — Discharge Summary (Signed)
[name], [name]             ACCOUNT NO.:  [id number]   MEDICAL RECORD NO.:  [id number]          PATIENT TYPE:  INP   LOCATION:  3025                         FACILITY:  [facility]   PHYSICIAN:  Ladell Pier, M.D.   DATE OF BIRTH:  08/23/1980   DATE OF ADMISSION:  03/08/2007  DATE OF DISCHARGE:  03/09/2007                               DISCHARGE SUMMARY   DISCHARGE DIAGNOSES:  1. Pneumonia.  2. Anemia.  3. Sickle cell crisis.  4. History of pulmonary embolism.   DISCHARGE MEDICATIONS:  1. Oxycodone p.r.n.  2. Ibuprofen p.r.n.  3. Avelox 400 mg daily for 7 days.   PROCEDURES:  None.   CONSULTANT:  None.   HISTORY OF PRESENT ILLNESS:  The patient is a 30 year old gentleman with  a past medical history significant for sickle cell disease, who states  that that morning he woke up short of breath with right flank pain.  The  pain is described as sharp and constant; he also complains of a cough,  but the cough is nonproductive.  He has had no fevers or chills, no  shortness of breath.  Please see admission note for remainder of  history.   PAST MEDICAL HISTORY:  Per admission H&P.   FAMILY HISTORY:  Per admission H&P.   SOCIAL HISTORY:  Per admission H&P.   MEDICATIONS:  Per admission H&P.   ALLERGIES:  Per admission H&P.   REVIEW OF SYSTEMS:  Per admission H&P.   PHYSICAL EXAMINATION ON DISCHARGE:  Temperature 98.5, pulse 75,  respirations of 18, blood pressure 140/71, pulse oximetry 93% on room  air.  HEENT:  Head is normocephalic, atraumatic.  Pupils are reactive to  light.  Throat without erythema.  CARDIOVASCULAR:  Regular rate and  rhythm.  LUNGS:  Clear bilaterally.  ABDOMEN:  Positive bowel sounds.  EXTREMITIES:  No edema.   HOSPITAL COURSE:  PROBLEM #1 - PNEUMONIA:  The patient was admitted for  pneumonia and started on IV antibiotics.  He had a spiral CT of the  chest that was negative for PE, but did show multilobar pneumonia.  The  patient states that he  was feeling fine and wanted to go home; he did  not want to stay.  He was able to tolerate the antibiotic for his  pneumonia.  He will follow up with his primary care physician next week.   PROBLEM #2 - ANEMIA:  The patient's baseline hemoglobin is 9.1.  Hemoglobin today is 7.7; he will be transfused 1 units of blood.   PROBLEM #3 - HYPOKALEMIA:  The patient was mildly hypokalemic.  He was  given potassium replacement.   LABORATORY DATA:  Iron 59, TIBC 192, percent saturation 31.  Sodium 136,  potassium 3.2, chloride 107, CO2 26, glucose 100, BUN 4, creatinine  0.67, calcium 8.4.  WBC 20.5, hemoglobin 7.7, platelets 466,000.   RADIOLOGIC FINDINGS:  Chest x-ray showed new right lower lobe  infiltrate.   Spiral CT was negative for PE, right lower lobe pneumonia, mild patchy  opacity in the right upper lobe consistent with pneumonia.      Ladell Pier,  M.D.  Electronically Signed     NJ/MEDQ  D:  03/09/2007  T:  03/11/2007  Job:  [id number]
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
## 2010-06-15 NOTE — Group Therapy Note (Signed)
NAMENOAH, [name]             ACCOUNT NO.:  [id number]   MEDICAL RECORD NO.:  [id number]          PATIENT TYPE:  INP   LOCATION:  3029                         FACILITY:  [facility]   PHYSICIAN:  Renee Ramus, MD       DATE OF BIRTH:  1980/11/21                                 PROGRESS NOTE   PRIMARY DIAGNOSIS:  Sickle cell crisis.   SECONDARY DIAGNOSES:  1. Skin abscess on the right ear.  2. Tobacco abuse history.  3. Anemia requiring blood transfusion.  4. Leukocytosis without signs of infection.   HOSPITAL COURSE BY PROBLEM:  1. Sickle cell crisis:  Patient is a 30 year old male who is admitted      secondary to severe abdominal and lower extremity pain and chest      pain.  Patient has a longstanding history of sickle cell anemia.      He was admitted to our service.  He received Dilaudid PCA, now been      converted to Dilaudid p.o. and hydroxyurea as well as folate.      Patient will be discharged in the a.m. for followup with his      primary care physician and adjustment to medications.  Patient has      been counseled in respect to the damage and danger that exists with      smoking and having this disease.  He is receiving a nicotine patch      in-house and has received tobacco cessation counseling.  2. Skin abscess:  The surgical consult is currently pending for      incision and drainage of this abscess.  3. Tobacco abuse:  As above.  4. Anemia:  Patient did require a blood transfusion of 1 units packed      RBCs when his hemoglobin dropped below 7.8.  Patient is currently      stable.  He received his CBC prior to discharge to assess his      stability for discharge in the a.m.   LABS OF NOTE:  Leukocytosis, initially 14.8, increasing to 21.2, decreasing to 16.8 and  finally 18 on the day prior to discharge.  Anemia with initial  hemoglobin of 9.2, decreasing to 7.8 and increasing to 8.8 after 1 unit  of packed red blood cells.  MCV somewhat macrocytic at 101.4.   After  transfusion decreased to 99.8.  Platelet count elevated at 440.  Reticulocyte percentage at 8.7 initially, increasing to 10.7.  ESR of 8.  INR 1.1.  Mild hypokalemia with a potassium of 3.2.  Serum iron of 70  with a ferritin of 376.  CRP of 0.3.  UA showing no evidence of  infection in studies.   Two view of the chest showing right basilar density with some residual  scarring, but no acute changes.  Patient has stable cardiomegaly and  chronic bronchitic changes but nothing that represented itself as acute,  especially with comparison with previous films.   DISCHARGE MEDICATIONS:  1. Dilaudid 4 mg p.o. q.6h. p.r.n. pain.  2. Folate 2 mg p.o. daily.  3. Hydroxyurea 500 mg p.o. b.i.d.  There will be labs and studies pending for tomorrow prior to discharge.  Patient is in stable condition and very anxious for discharge.      Renee Ramus, MD  Electronically Signed     JF/MEDQ  D:  09/28/2007  T:  09/28/2007  Job:  [id number]

## 2010-06-15 NOTE — Consult Note (Signed)
[name], [name]             ACCOUNT NO.:  [id number]   MEDICAL RECORD NO.:  [id number]          PATIENT TYPE:  INP   LOCATION:  4742                         FACILITY:  [facility]   PHYSICIAN:  Wilmon Arms. Corliss Skains, M.D. DATE OF BIRTH:  04-Dec-1980   DATE OF CONSULTATION:  01/09/2008  DATE OF DISCHARGE:                                 CONSULTATION   TIME OF CONSULTATION:  15:35 p.m.   REQUESTING PHYSICIAN:   Dictation ended at this point.      Letha Cape, PA      Wilmon Arms. Corliss Skains, M.D.     KEO/MEDQ  D:  01/09/2008  T:  01/10/2008  Job:  [id number]

## 2010-06-15 NOTE — Discharge Summary (Signed)
[name], [name]             ACCOUNT NO.:  [id number]   MEDICAL RECORD NO.:  [id number]          PATIENT TYPE:  INP   LOCATION:  5503                         FACILITY:  [facility]   PHYSICIAN:  Marcellus Scott, MD     DATE OF BIRTH:  11-29-1980   DATE OF ADMISSION:  01/08/2008  DATE OF DISCHARGE:  01/15/2008                               DISCHARGE SUMMARY   PRIMARY MEDICAL DOCTOR:  Lorelle Formosa, M.D.   This is an addendum to the interim discharge summary that was done by  Dr. Elliot Cousin on October 14, 2007.  This will update events since,  including discharge medications.   DISCHARGE DIAGNOSES:  1. 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  2. Leukocytosis secondary to sickle cell disease.  3. Tobacco abuse.  4. Hypokalemia, repleted.  5. Status post laparoscopic cholecystectomy.  6. Thrombocytosis, ? reactive.   DISCHARGE MEDICATIONS:  1. Ibuprofen 800 mg p.o. q.8 h p.r.n. with meals.  2. Hydroxyurea 500 mg p.o. daily.  3. Folic acid 1 mg p.o. daily.  4. Percocet 5/325 one p.o. q.4-6 h p.r.n.   PROCEDURES:  Since December 13, chest x-ray on December 14.  Impression:  1. No acute findings.  2. Mild linear atelectasis, right lower lobe.  3. Cardiomegaly.   PERTINENT LABORATORY DATA:  Comprehensive metabolic panel remarkable for  total bilirubin 6.6, alk-phos 120, BUN 6, creatinine 0.51.  CBCs today  with hemoglobin 7.8, hematocrit 22.7, white blood cell 18.6, platelets  578.  Urinalysis negative for features of urinary tract infection.  Magnesium 1.9.   HOSPITAL COURSE/DISPOSITION:  Since the evening of December 13, the  patient was started on Zosyn for his leukocytosis, athough the patient  was afebrile.  Apart from the improving mild abdominal pain secondary to  his recent surgery, the patient denied pain anywhere else or cough or  dyspnea or any symptoms suggesting a source of infection.  His  hemoglobin, however, on morning 01/14/2008, dropped to 7.2 following  which he was transfused a unit of packed red blood cells.  His further  infection workup with urinalysis and chest x-ray have been negative.  Today, the patient indicates that his abdominal pain is minimal and he  is tolerating his diet and has had a BM.  He indicates that he would  like to go home today.  I have recommended the patient have a repeat H&H  check either today or tomorrow and consider transfusing him if his  hemoglobin drops.  His baseline hemoglobin he says is anywhere between  8.5 and 9 g/dL.  The patient, however, declined this offer and says he  does not want to stay any further in the hospital.  I have recommended  he call his primary medical doctor for an appointment to be seen in the  next 3-4 days with a repeat CBC.  Also, he has to make an appointment  with Dr. Corliss Skains, phone [phone], to be seen  in the next 2-3 weeks.  I have discussed with Dr. Zachery Dakins over the  phone who indicated that the patient does not need to be on any  antibiotics any further from a surgical stand point.  Zosyn will be  promptly discontinued.  The patient will be discharged to follow up with  his primary medical doctor and Dr. Corliss Skains as an outpatient.      Marcellus Scott, MD  Electronically Signed     AH/MEDQ  D:  01/15/2008  T:  01/15/2008  Job:  [id number]   cc:   Wilmon Arms. Tsuei, M.D.  Anselm Pancoast. Zachery Dakins, M.D.  Lorelle Formosa, M.D.
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
## 2010-06-15 NOTE — H&P (Signed)
[name], [name]             ACCOUNT NO.:  [id number]   MEDICAL RECORD NO.:  [id number]          PATIENT TYPE:  INP   LOCATION:  3036                         FACILITY:  [facility]   PHYSICIAN:  Sabino Donovan, MD        DATE OF BIRTH:  [birth year]   DATE OF ADMISSION:  04/15/2008  DATE OF DISCHARGE:  04/16/2008                              HISTORY & PHYSICAL   CHIEF COMPLAINT:  Leg pain and headache.   PRIMARY CARE PHYSICIAN:  Lorelle Formosa, M.D.   HISTORY OF PRESENT ILLNESS:  The patient is a 30 year old African  American male with a history of sickle cell anemia and pulmonary  embolus, presented with complaint of bilateral leg pain, left greater  than right and headache.  The patient reports that it is similar to pain  from his sickle cell crisis, usually during his crisis his pain would be  in his flanks and the side, however at this time, his left leg and  headache is bothering him.  He also reports some cough, nonproductive  and reports sneezing; however, reports good food intake.  Denies any  chest pain or shortness of breath.  He reports compliance with his  medication including hydroxyurea.  Denies any sick contacts.  Denies any  fever, chills.   PAST MEDICAL HISTORY:  1. Pulmonary embolism  2. Sickle cell anemia.   FAMILY HISTORY:  Significant for sickle cell trait, but not anemia.   SOCIAL HISTORY:  Positive for marijuana and tobacco.   DRUG ALLERGIES:  He is allergic to MORPHINE.   MEDICATIONS:  1. He takes hydroxyurea 500 mg p.o. daily.  2. Vicodin 7.5/325 q.6 h. p.r.n.  3. Ibuprofen q.6 h. p.r.n.   PHYSICAL EXAMINATION:  VITAL SIGNS:  Temperature 98.6, pulse 86,  respiratory rate 16, and blood pressure 121/76.  GENERAL:  He is in mild distress.  HEENT:  PERRLA.  EOMI.  NECK:  No lymphadenopathy.  No thyromegaly.  No JVD.  CHEST:  Clear to auscultation bilaterally.  CARDIOVASCULAR:  Regular rate and rhythm.  He has a 2/6 systolic  ejection murmur  throughout his precordium.  ABDOMEN:  Soft, nontender, and nondistended.  Normoactive bowel sounds.  EXTREMITIES:  No clubbing, cyanosis, or edema.  NEUROLOGIC:  Focally intact.   LABORATORY DATA:  Sodium 136, potassium 3.9, BUN 7, and creatinine 0.6.  White count is 23.4, H&H 8.8 and 26.2, and platelets 530.  AST 34, ALT  28,  and albumin 4.4.  Chest x-ray shows no acute cardiopulmonary  process.  CT head was negative.  UA showed 15 ketones, otherwise small  leukocyte esterase, 0-2 white blood cells.   IMPRESSION:  The patient is a 30 year old African American male with a  sickle cell crisis:  1. Sickle cell crisis.  We will give aggressive hydration with normal      saline.  We will continue Dilaudid 2 mg IV q.1-2 h. p.r.n.  He is      requesting Dilaudid PCA.  At this time, I have instructed him that      we will consult pain management if he indeed  requires PCA pump.  We      will also initiate the patient on Motrin 800 mg p.o. q.8 h. and      continue him his hydroxyurea, given his low blood count we will      also transfuse him with 2 units of packed red blood cells.  2. Anemia likely secondary to sickle cell crisis.  We will transfuse      him with 2 units of blood and give oxygen as well as normal saline.  3. Prophylaxis.  Lovenox and Protonix.      Sabino Donovan, MD  Electronically Signed     MJ/MEDQ  D:  04/16/2008  T:  04/17/2008  Job:  [id number]   cc:   Lorelle Formosa, M.D.

## 2010-06-15 NOTE — H&P (Signed)
[name], [name]             ACCOUNT NO.:  [id number]   MEDICAL RECORD NO.:  [id number]          PATIENT TYPE:  INP   LOCATION:  1831                         FACILITY:  [facility]   PHYSICIAN:  Eduard Clos, MDDATE OF BIRTH:  09/27/1980   DATE OF ADMISSION:  03/19/2007  DATE OF DISCHARGE:                              HISTORY & PHYSICAL   CHIEF COMPLAINT:  Chest pain.   HISTORY OF PRESENT ILLNESS:  A 30 year old male with a known history of  sickle cell disease, history of pulmonary embolism, presently  complaining of chest pain.  His chest pain started last night which was  present in both lower aspects of his chest, not related to exertion.  The patient had no associated cough or shortness of breath.  He denies  any palpitations, dizziness, or loss of consciousness.  The chest pain  is more like a sharp catching type, no relation to breathing.  He denies  any fever or chills.  He denies any loss of consciousness, no dizziness,  abdominal pain, nausea, vomiting, or diarrhea.  The patient decided to  come to the ER today wherein his chest x-ray showed an infiltrate in the  right lingula area the left lower aspect.  The patient is admitted for  further workup to treat for his pneumonia.  The patient was recently  discharged two weeks for being treated for pneumonia.   PAST MEDICAL HISTORY:  1. Sickle cell disease.  2. History of pulmonary embolism 4-6 months ago wherein he was on      Coumadin.  The patient states he took the Coumadin for a few weeks      and stopped.   PAST SURGICAL HISTORY:  Right hip surgery.   MEDICATIONS PRIOR TO ADMISSION:  The patient takes oxycodone,  acetaminophen as needed, dose not known, ibuprofen as needed.  The  patient was supposed to be on hydroxyurea which he states he has not  taken for 3-4 months.   FAMILY HISTORY:  The patient's mother has diabetes.   SOCIAL HISTORY:  The patient smokes cigars and has been advised to quit  smoking  cigars.  Alcohol:  Occasional.  He denies any drug abuse.   ALLERGIES:  MORPHINE.   REVIEW OF SYSTEMS:  As per history of present illness, nothing else  significant.   PHYSICAL EXAMINATION:  GENERAL:  The patient was examined at the  bedside.  Not in acute distress having lunch.  VITAL SIGNS:  Blood pressure is 133/82, pulse 76 per minute, temperature  97.9, respirations 20 per minute.  HEENT:  Anicteric.  No pallor.  PULMONARY:  Bilateral air entry present.  No rhonchi.  No crepitation.  HEART:  S1 S2 heard.  ABDOMEN:  Soft, nontender.  Bowel sounds heard.  CNS:  Alert, awake, oriented to time, place, and person.  Moves upper  and lower extremities 5/5.  EXTREMITIES:  Peripheral pulses felt.  No edema.   LABORATORY:  Chest x-ray discussed with radiologist has right lingular  and left lower lobe infiltrates.  CMP:  Sodium 141, potassium 3.3,  chloride 106, glucose 94, creatinine 0.7.  CBC:  WBC 17.4, hemoglobin  10.9, hematocrit 32, platelets 503, neutrophils 9%.   ASSESSMENT:  1. Pneumonia.  2. History of sickle cell disease.  3. History of pulmonary embolism.   PLAN:  Admit the patient under [facility] to telemetry.  We  will start the patient on empiric antibiotics, obtain blood cultures,  and sputum cultures.  We will also get a CT angiogram of the chest as  the patient has a history of pulmonary embolism to rule out any  pulmonary embolism.  Further orders will be determined as the patient's  condition evolves.  The patient has been advised to quit smoking cigars.      Eduard Clos, MD  Electronically Signed     ANK/MEDQ  D:  03/19/2007  T:  03/19/2007  Job:  [id number]

## 2010-06-15 NOTE — Op Note (Signed)
NAMEMATHEU, PLOEGER             ACCOUNT NO.:  [id number]   MEDICAL RECORD NO.:  [id number]          PATIENT TYPE:  INP   LOCATION:  4742                         FACILITY:  [facility]   PHYSICIAN:  Wilmon Arms. Corliss Skains, M.D. DATE OF BIRTH:  18-Jun-1980   DATE OF PROCEDURE:  01/11/2008  DATE OF DISCHARGE:                               OPERATIVE REPORT   PREOPERATIVE DIAGNOSIS:  Chronic calculus cholecystitis.   POSTOPERATIVE DIAGNOSES:  1. Chronic calculus cholecystitis.  2. Umbilical hernia.   PROCEDURES:  1. Laparoscopic cholecystectomy with intraoperative cholangiogram.  2. Umbilical hernia repair.   SURGEON:  Wilmon Arms. Corliss Skains, MD, FACS   ASSISTANT:  Ollen Gross. Vernell Morgans, MD   ANESTHESIA:  General endotracheal.   INDICATIONS:  The patient is a 30 year old male with sickle cell disease  who presented with diffuse pain in his right upper quadrant and chest  associated with nausea.  He has had multiple episodes over the last year  with what we felt was sickle cell crisis, but many times his pain was on  his right side.  His medicine physician obtained an ultrasound which  showed multiple gallstones but no sign of wall thickening.  We were  consulted for evaluation for cholecystectomy.  A HIDA scan showed no  filling of the gallbladder.  Due to this constellation of findings and  these test results, the decision was made to proceed with laparoscopic  cholecystectomy.   DESCRIPTION OF PROCEDURE:  The patient was brought to the operating room  and placed in supine position on the operating room table.  After an  adequate level of general anesthesia was obtained, the patient's abdomen  was prepped with Betadine and draped in sterile fashion.  A time-out was  taken to ensure the proper patient and proper procedure.  The area below  the umbilicus was infiltrated with 0.25% Marcaine with epinephrine.  A  small umbilical hernia was palpated in the base of his umbilicus.  A  transverse incision  was made below umbilicus.  Dissection was carried  down to the subcutaneous tissues and into the hernia sac.  We entered  the peritoneal cavity bluntly.  There were no adhesions in the umbilical  hernia.  The opening was enlarged slightly to 1 cm.  A purse-string  suture of 0-Vicryl was placed around the fascial opening.  The Hasson  cannula was inserted and secured with stay suture.  Pneumoperitoneum was  obtained by insufflating CO2 and maintaining a maximal pressure of 15  mmHg.  An 11-mm port was placed in the subxiphoid position.  Two 5-mm  ports were placed in the right upper quadrant.  The patient has a very  large liver that extends down to the level of his umbilicus, so all of  our ports were placed rather low.  In the left upper quadrant, we did  not visualize the spleen.  The edge of the liver was elevated and a very  large gallbladder was identified.  It was grasped with a clamp and  elevated.  There were lot of adhesions to the surface of the gallbladder  and these were taken  down bluntly and with scissors.  The patient has  several large lymph nodes around the hilum of the gallbladder.  The  peritoneum around the hilum of gallbladder was opened with a hook  cautery.  We mobilized the lymph nodes superiorly to be taken with the  gallbladder.  A cystic artery was identified and was circumferentially  dissected.  We also identified the cystic duct.  There seemed to be  stones within the cystic duct.  We tried to milk these back, but we were  unable to do so.  A clip applier was used to ligate the cystic artery  which was then divided with scissors.  The cystic duct was ligated with  clip distally.  A small opening was created on the cystic duct.  Several  tiny stones were milked back out of the cystic duct opening.  We then  attempted to pass a Cook cholangiogram catheter through a stab incision  into the cystic duct.  This was threaded in for about a centimeter and  then hit an  obstruction.  We held in place with a clip and tried to  irrigate with saline, but the saline leaked back due to obstruction.  We  tried to pass a Fogarty catheter, but we were unable to get it past this  area of obstruction.  We then placed Reddick catheter and secured it  with a balloon as well as a clip.  This did not leak.  A cholangiogram  was then obtained which showed a very diminutive cystic duct which was  also very tortuous.  Contrast flowed into the duodenum and the normal-  sized biliary tree.  Contrast flowed proximally with no filling defects.  The catheter was removed and the cystic duct was ligated with clips and  divided.  Cautery was then used to remove the gallbladder from the liver  bed.  The gallbladder was placed in EndoCatch sac and removed the  umbilical port site.  We irrigated the right upper quadrant.  No  bleeding was noted.  Pneumoperitoneum was then released as we removed  the trocars.  The purse-string sutures were used to close the umbilical  fascia.  A 4-0 Monocryl was used to close the skin incisions.  The  gallbladder was opened on the back table and held hundreds of black  gallstones with a lot of sludge.  This was sent for pathologic  examination.  The wounds were then closed with 4-0 Monocryl and Steri-  Strips.  The patient was then extubated and brought to recovery room in  stable condition.  All sponge, instrument, and needle counts were  correct.      Wilmon Arms. Tsuei, M.D.  Electronically Signed     MKT/MEDQ  D:  01/11/2008  T:  01/11/2008  Job:  [id number]

## 2010-06-15 NOTE — Discharge Summary (Signed)
[name], [name]             ACCOUNT NO.:  [id number]   MEDICAL RECORD NO.:  [id number]          PATIENT TYPE:  INP   LOCATION:  4737                         FACILITY:  [facility]   PHYSICIAN:  Thomasenia Bottoms, MDDATE OF BIRTH:  09-02-1980   DATE OF ADMISSION:  03/19/2007  DATE OF DISCHARGE:  03/23/2007                               DISCHARGE SUMMARY   SUMMARY OF HOSPITAL COURSE:  [name] is a 30 year old gentleman who  presented to the hospital with pleuritic right side pain, as he puts  it,but it was lateral chest wall pain that hurt when he took a deep  breath.  The patient had been treated for pneumonia been hospitalized  February 5 through February 6.  He did well and was discharged.  He  presented again on February 16 with symptoms that were felt to be  related to pneumonia. The patient admits that he did not take his  antibiotics as directed when he left the hospital. He took a few of them  but did not take the full 7-day course. During this hospital stay, he  was treated for mild sickle cell crisis and pneumonia.  She was treated  with vancomycin and Zosyn for a total of 4 days inpatient. On the day of  discharge, the patient says he feels fine.  He did not require oxygen  during this hospital stay all. He was not febrile.  He was ambulating  without difficulty.  He had no further pleuritic chest pain by the time  of discharge. I had discussed at length the seriousness of his  multilobar pneumonia not being treated fully. He does understand that,  and he said he absolutely will take all of his antibiotics as directed  this time.  The patient did not receive treatment for atypicals during  this hospital stay, and we know he took some of his Avelox 2 weeks ago.  This time we will discharge him on Augmentin   DISCHARGE DIAGNOSES:  1. Bilateral pneumonia  2. Mild sickle cell crisis.  3. History of pulmonary embolus in 2008.  4. History of noncompliance with Coumadin.  The patient stopped taking      Coumadin himself a weeks after his PE when he felt better.  5. Persistent leukocytosis. In reviewing the patient's lab history, he      has not had a single white count below 16 since April 2007 which is      as far back as I can see on the computer screen today.  This was 20      when he was admitted;  it is 18 at the time of discharge.  6. Thrombocytosis. The patient seems to have had elevated platelets      since at least November 2008.  7. CT scan revealed some scarring of the lung in addition to active      chest x-ray and CT scan of the chest.  The CT scan revealed lung      scarring.  Some other studies done during this hospital stay      include chest x-ray and a CT scan  of the chest.  The CT revealed no      evidence of PE.  He had bilateral lung densities with some interval      improvement since his last chest CT which was March 08, 2007. He      has a very small right pleural effusion which was felt to be      slightly larger than before on his CT. The patient did have another      chest x-ray 3 days later which showed that the effusion seemed to      be slightly smaller and does not appear to be large enough to be      tapped.  8. Hypokalemia.  This was treated.  9. Tobacco abuse. He did receive a smoking cessation counseling.   DISCHARGE MEDICATIONS:  1. Augmentin 875 mg p.o. b.i.d.  x6 days which will complete a total      of 10 days of antibiotics this go round.  2. The patient says he takes oxycodone periodically as an outpatient      as needed for pain.      Thomasenia Bottoms, MD  Electronically Signed     CVC/MEDQ  D:  03/23/2007  T:  03/24/2007  Job:  [id number]   cc:   Lorelle Formosa, M.D.

## 2010-06-15 NOTE — Discharge Summary (Signed)
[name], [name]             ACCOUNT NO.:  [id number]   MEDICAL RECORD NO.:  [id number]          PATIENT TYPE:  INP   LOCATION:  5508                         FACILITY:  [facility]   PHYSICIAN:  Marcellus Scott, MD     DATE OF BIRTH:  03-05-1980   DATE OF ADMISSION:  08/18/2007  DATE OF DISCHARGE:  08/21/2007                               DISCHARGE SUMMARY   PRIMARY MEDICAL DOCTOR:  Lorelle Formosa, M.D.   DISCHARGE DIAGNOSES:  1. Sickle cell crisis.  2. Right middle lobe and lower lobe community-acquired pneumonia.  3. 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  4. Chronic leukocytosis.  5. Chronic thrombocytosis.  6. Macrocytosis.  7. Hypokalemia.  8. Pulmonary scarring.  9. History of single episode of pulmonary embolism.  10.Tobacco and substance abuse.   DISCHARGE MEDICATIONS:  1. Avelox 400 mg p.o. daily from August 22, 2007 for 4 days.  2. Folate 2 mg p.o. daily.  3. Hydroxyurea 500 mg p.o. b.i.d.  4. Ibuprofen 400 mg p.o. t.i.d. with meals for 3 days, then as needed.  5. Prilosec over-the-counter 20 mg p.o. daily.  6. Dilaudid 4 mg p.o. q.4 h. p.r.n., 20 tablets prescribed.   PROCEDURES:  1. CT angiogram of the chest on August 18, 2007 impression:      a.     Study is not technically adequate to evaluate for pulmonary       embolus.      b.     Scattered foci of right lung airspace disease in the right       lower lobe and right middle lobe.  Could represent infection or       infarction in a patient with sickle cell anemia.  Lung findings       are compatible with acute chest syndrome.  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      c.     Cardiomegaly.  2. Chest x-ray on August 18, 2007 impression:  Worsening aeration to the      right base which is concerning for pneumonia.   LABORATORY DATA:  CBC today with hemoglobin 9.1, hematocrit 27.7, MCV  101, white blood cell 18.7,  platelets 582.  Blood ammonia level 45.  Comprehensive metabolic panel unremarkable except for total bilirubin  3.2, alkaline phosphatase 129, BUN 5, creatinine 0.57.  RBC:  Folate  482, vitamin B-12 345, absolute reticulocyte count 242.6.   CONSULTATIONS:  Dr. Willey Blade of sickle cell.   HOSPITAL COURSE/DISPOSITION:  Please refer to the history and physical  on initial admission details.  In summary, [name] is a 30 year old  African American male patient with history of hemoglobin SS disease,  previous sickle cell crisis, history of a single episode of pulmonary  embolism when he was noncompliant with his Coumadin and stopped it a  week after starting.  He now presented with history of chest pain, lower  extremities pain which began on the morning of admission.  Further  evaluation  was suggestive of sickle cell crisis.  CT of the chest  suggested right middle lobe and lower lobe pneumonia versus infarction.  The patient was thereby admitted for further evaluation and management.  1. Sickle cell crisis.  The patient was admitted to a medical bed.  He      was provided IV hypotonic fluids, Motrin, hydroxyurea, frequent      doses of IV Dilaudid.  With these measures, patient made some      improvement with resolution of his chest pain and shoulder pains,      but continued to have lower extremity pain.  Sickle cell disease      consult was requested.  Dr. [name] who increased Dilaudid from 1-2 mg      every 3 hours to 2 mg every 2 hours.  Today, the patient says his      lower extremity pains are also much better and he has minimal pain      which he can manage at home.  He is eager to go home.  I have      discussed this with Dr. [name] and patient is cleared for discharge.  2. Sickle cell anemia.  The patient was transfused packed red blood      cells when his hemoglobin dropped to 7.7.  It has appropriately      come up and stayed stable.  3. Right middle lobe and lower lobe  pneumonia on CT of the chest which      may well be pulmonary infarct from his sickle cell crisis.  The      patient denied any history of coughing, fevers prior to the morning      of admission.  In any event, the patient was started empirically on      IV ceftriaxone and Zithromax of which he is day 3 today.  The      patient is to complete a total week course of antibiotics and is      being advised to follow up with Dr. [name] for a repeat chest x-      ray in a couple of weeks to ensure resolution.  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  4. History of a single episode of pulmonary embolism, question a year      ago.  The patient indicates that he just took the Coumadin for 1      week and then stopped, and has not followed up with anyone for INR      checks or taken the Coumadin.  He has been consistently      noncompliant at least on 2 recent admissions.  A large PE on this      CT was ruled out.  Unfortunately, the CT was not an adequate study      to pick up peripheral pulmonary embolisms.  In any event, the      patient was not hypoxic and hemodynamically stable with low index      of suspicion for pulmonary embolism.  Thereby, no anticoagulation      was resumed in hospital.  5. The patient has been counseled regarding tobacco cessation and      substance abuse cessation which I am not sure he will adhere to.   FOLLOW UP:  Also the patient is advised to follow up with [facility] in  the next 5 days with a repeat CBC.  Marcellus Scott, MD  Electronically Signed     AH/MEDQ  D:  08/21/2007  T:  08/21/2007  Job:  [id number]   cc:   Lorelle Formosa, M.D.  [name] L. [name], M.D.

## 2010-06-15 NOTE — Consult Note (Signed)
[name], [name]             ACCOUNT NO.:  [id number]   MEDICAL RECORD NO.:  [id number]          PATIENT TYPE:  INP   LOCATION:  4702                         FACILITY:  [facility]   PHYSICIAN:  Pearlean Brownie, M.D.DATE OF BIRTH:  1980-08-11   DATE OF CONSULTATION:  08/03/2006  DATE OF DISCHARGE:                                 CONSULTATION   REQUESTING PHYSICIAN:  Dr. Carman Ching with Orthopedic Surgery.   REASON FOR CONSULTATION:  Sickle cell crisis.   CONSULTING ATTENDING:  Dr. Pearlean Brownie [facility]  [facility].   PRIMARY CARE PHYSICIAN:  Dr. [name].  The patient is also seen  at [facility].   HISTORY OF PRESENT ILLNESS:  This is a 30 year old African American male  with a hemoglobin SS sickle cell disease and postop day 0, status post  right total hip replacement this afternoon.  The patient is now in  sickle cell pain crisis upon awakening after surgery.  The patient's  pain is greatest in his side and back, which is typical for his pain  crises, as well as at his surgery site.  The patient is currently on low-  dose Dilaudid PCA, but not getting adequate relief.  He received 100 mcg  of fentanyl in the PACU as well as 1.5 mg of Dilaudid, then 2 Percocets  on the floor.  The patient reports his typical PCA doses are a 4-mg  loading dose of Dilaudid followed by 2 mg subcu every 8 minutes.  Of  note, the patient underwent preoperative transfusion at [facility] last week and received 1 unit of packed red blood cells in the  PACU this afternoon.  The patient's baseline hemoglobin is around 7-8.   REVIEW OF SYSTEMS:  The patient denies fever, chills, headaches, runny  nose, cough, shortness of breath, chest pain, nausea, vomiting,  diarrhea, dysuria, hematuria, bloody stools, numbness or tingling or  vision changes.   ALLERGIES:  Patient is allergic to MORPHINE, which causes itching.   CURRENT MEDICATIONS:  1.  Dilaudid PCA with 0.5 mg loading dose and 0.3 mg every 8 minutes as      needed with a 5-mg, 4-hour lockout.  2. Heparin 3000 units subcu q.12 h.   HOME MEDICATIONS:  1. Folic acid 1 mg p.o. daily.  2. Hydroxyurea 500 mg p.o. b.i.d.  3. Oxycodone p.r.n.   PAST MEDICAL HISTORY:  1. Hemoglobin SS sickle cell disease with baseline hemoglobin of 7-8      and a history of hyposplenism as well as prior admissions for acute      chest syndrome.  2. History of heart murmur and cardiomegaly with episodes of      palpitations, but no heart failure or valvular disease.  3. No history of strokes related to sickle cell disease.  4. History of cholelithiasis.  5. 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  6. 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  7. History  of left foot ulcer, now healed.   FAMILY HISTORY:  The patient's father died secondary to a gunshot wound.  The patient's mother is still living and has diabetes.  The patient's  brother has sickle cell trait.   SOCIAL HISTORY:  The patient has a serious girlfriend.  He denies  tobacco or alcohol use.  He does admit to occasional marijuana use.   PHYSICAL EXAM:  VITAL SIGNS:  Temperature is 98.1, heart rate 95,  respiratory rate 17, blood pressure 136/77 and oxygen saturation 100% on  room air.  GENERAL:  The patient is awake, alert and visibly writhing in pain.  HEENT:  Head is normocephalic and atraumatic.  Pupils are pinpoint after  receiving narcotics.  The patient has moist mucous membranes and minimal  scleral icterus.  CARDIOVASCULAR:  The patient is mildly tachycardic with a 3/6 systolic  ejection murmur audible throughout precordium and 2+ dorsalis pedis  pulses.  LUNGS:  Clear to auscultation bilaterally with normal work of breathing  and no wheezes, rales or rhonchi.  ABDOMEN:  Hypoactive bowel sounds, is soft, nontender  and nondistended.  EXTREMITIES:  Have compression hose in place and have no clubbing,  cyanosis or edema and 2+ distal pulses.  Right hip incision is with  dressing clean, dry and intact and adequate hemostasis.  SKIN:  No rash or lesions.  NEUROLOGICAL:  The patient is alert and oriented x3 and moving all 4  extremities.  He has normal sensation throughout and neurological exam  is nonfocal and grossly intact.   LABORATORY DATA:  Preoperative CBC revealed a white blood cell count of  11.4, hemoglobin of 11, hematocrit of 33 and platelets of 345,000.  Postoperatively, and after receiving 1 unit of packed red blood cells,  the patient's hemoglobin was 8.6 and hematocrit 26.  Complete metabolic  panel revealed a sodium of 135, potassium 4, chloride of 102, bicarb of  27, BUN of 7, creatinine of 0.54, glucose of 86, total bilirubin of 2.3,  alkaline phosphatase of 96, AST of 25, ALT 17, total protein of 7.1,  albumin of 4 and calcium is 9.   ASSESSMENT AND PLAN:  This is a 30 year old African American male with  hemoglobin SS sickle cell disease in acute sickle cell pain crisis on  postoperative day #0, status post right total hip replacement by  Orthopedic Surgery Service.  We are asked to see the patient in  consultation of the sickle cell pain crisis and management.   1. Sickle cell pain crisis:  This is likely precipitated by the      patient's surgery.  The patient has no signs or symptoms of      infection and is afebrile.  The patient typically required very      high-dose Dilaudid patient-controlled analgesia for adequate      relief.  We will give 4 mg Dilaudid loading dose and increase as-      needed PCA dose to 0.5 mg.  Will also add basal rate of 0.5 mg per      hour.  We will start Toradol 30 mg intravenously q.6 h for 5 days      and Tylenol 1000 mg p.o. t.i.d. scheduled as well.  We will      continue remainder of PCA protocol orders.  Given the patient's      baseline  hemoglobin is 7-8 and current hemoglobin is 8.6, we will      hold on transfusing additional blood unit.  Will recheck CBC,      reticulocyte count and BMP in the morning.  We will have low      threshold for infection workup should the patient become febrile.  2. Respiratory status:  Is currently stable.  The patient is on      continuous pulse oximetry while on PCA.  Currently, he has no      oxygen requirement.  We will encourage incentive spirometry to      prevent atelectasis.  3. Cardiovascular:  Currently stable with a history of heart murmur      and cardiomegaly and palpitations.  Will transfer the patient to      telemetry bed.  4. Fluids, electrolytes and nutrition/gastrointestinal:  Diet per      Orthopedic Surgery.  We will increase intravenous fluids to 200 mL      per hour.  The patient's electrolytes are currently stable.  5. Prophylaxis:  The patient had compression hose and heparin per      Orthopedics.  We will start Colace 100 mg p.o. b.i.d. for      constipation prophylaxis and incentive spirometry as above.   DISPOSITION:  Is pending postoperative course and resolution of sickle  cell pain crisis.   We will continue to follow this patient as of current.  Please call with  questions.      Drue Dun, M.D.  Electronically Signed      Pearlean Brownie, M.D.  Electronically Signed    EE/MEDQ  D:  08/03/2006  T:  08/04/2006  Job:  [id number]

## 2010-06-15 NOTE — Discharge Summary (Signed)
[name], [name]             ACCOUNT NO.:  [id number]   MEDICAL RECORD NO.:  [id number]          PATIENT TYPE:  INP   LOCATION:  3036                         FACILITY:  [facility]   PHYSICIAN:  Beckey Rutter, MD  DATE OF BIRTH:  1981/01/19   DATE OF ADMISSION:  04/15/2008  DATE OF DISCHARGE:  04/16/2008                               DISCHARGE SUMMARY   PRIMARY CARE PHYSICIAN:  Lorelle Formosa, M.D.   ADDENDUM:  The patient signed against medical advice yesterday.  Nevertheless, I received a call today from the labs at the pager number  of [phone] in regards to positive blood culture.  As per the lab tests  the culture was gram positive cocci in clusters, no sensitivity is done  as of yet.  I was told two bottles were positive, nevertheless I am  looking at the computer now and I see only one bottle is positive.  At  this time we are not sure if this is a contaminant and of course we are  not sure if it is methicillin resistant.  In any event, I called Mr.  [name] at his home number which is [phone] and in fact I did talk  to him about the result of the culture and the need for immediate  evaluation.  I also stated that he might want to have somebody drive for  him or call [phone] if he feels fever or if he feels sick.  The patient  agreed to come back for further evaluation assessment to the emergency  room and I provided my full name and my pager number to him to call upon  arrival to the emergency department.  I suspect the blood culture will  need to be repeated and the patient will need to be monitored for fever  or sign of bacteremia/sepsis.  Please amend this to the previously  dictated discharge summary done yesterday when the patient signed  against medical advice.      Beckey Rutter, MD  Electronically Signed     EME/MEDQ  D:  04/17/2008  T:  04/17/2008  Job:  [id number]   cc:   Lorelle Formosa, M.D.

## 2010-06-15 NOTE — Consult Note (Signed)
[name], [name]             ACCOUNT NO.:  [id number]   MEDICAL RECORD NO.:  [id number]          PATIENT TYPE:  INP   LOCATION:  4742                         FACILITY:  [facility]   PHYSICIAN:  Wilmon Arms. Corliss Skains, M.D. DATE OF BIRTH:  12-24-1980   DATE OF CONSULTATION:  01/09/2008  DATE OF DISCHARGE:                                 CONSULTATION   TIME OF CONSULTATION:  1535.   REQUESTING PHYSICIAN:  Dr. Sherrie Mustache with the [facility].   CONSULTING SURGEON:  Wilmon Arms. Tsuei, MD   REASON FOR CONSULTATION:  Cholelithiasis/right upper quadrant pain.   HISTORY OF PRESENT ILLNESS:  [name] is a 30 year old black male who  has a history of sickle cell anemia who has also a history of pulmonary  embolus in the past who has had multiple admissions already this year  for sickle cell pain crises.  Currently, the patient presented to the  emergency department yesterday with complaint of left-sided chest pain.  At that time, the patient had a CT angio to rule out a pulmonary  embolus.  Currently, the patient did not have a pulmonary embolus.  Apparently looking through his history, the patient has on his multiple  admissions had epigastric/right upper quadrant abdominal pain with his  crisis.  Currently, the patient denies any abdominal or epigastric pain  and states with this crisis he has not had any abdominal or epigastric  pain.  Also, the patient denies any associated pain or nausea or  vomiting after eating.  He has had normal bowel movement with no  changes.  On this admission, he did have an ultrasound of the abdomen,  which showed multiple gallstones, however, no gallbladder wall  thickening, no pericholecystic fluid or ductal dilatation.  However, it  was believed that may be the patient was having biliary colic with his  sickle cells crisis since these crises were associated with right upper  quadrant abdominal pain.  Therefore at this time, we were consulted for  further  workup and possible cholecystectomy.   REVIEW OF SYSTEMS:  Please see HPI, otherwise, the patient admits to  some chest pain.  Otherwise, denies shortness of breath, abdominal pain,  nausea, vomiting, or diarrhea.  Otherwise, all other systems are  currently negative.   Family history is noncontributory.   Past medical history is significant for:  1. Sickle cell disease.  2. Numerous sickle cell crises.  3. History of pulmonary embolus.   PAST SURGICAL HISTORY:  1. Status post right hip replacement secondary to avascular necrosis.  2. Status post recent cyst removal from his right mandible.   SOCIAL HISTORY:  Currently, the patient smokes cigarettes.  He denies  any alcohol or drug abuse.   ALLERGIES:  NKDA.   MEDICATIONS:  1. Hydroxyurea 500 mg p.o. daily.  2. Folic acid 2 mg p.o. daily.  3. Vicodin 7.5 mg p.o. q.6 h. p.r.n. pain.   PHYSICAL EXAMINATION:  GENERAL:  [name] is a pleasant, well-  developed, well-nourished 30 year old black male who is currently lying  in bed in no acute distress.  VITAL SIGNS:  Temperature 98.6,  pulse 75, respirations 20, blood  pressure 129/87.  HEENT:  Head is normocephalic, atraumatic.  Sclerae are noninjected.  Pupils are equal, round, and reactive to light.  Ears and nose without  any obvious masses or lesions.  No rhinorrhea.  Mouth is pink and moist.  Throat shows no exudate.  NECK:  Supple.  Trachea is midline.  No thyromegaly.  HEART:  Regular rate and rhythm, normal S1 and S2.  No murmurs, gallops,  or rubs are noted.  A +2 carotid, radial, and pedal pulses bilaterally.  LUNGS:  Clear to auscultation bilaterally with no wheezes, rhonchi, or  rales noted.  Respiratory effort is unlabored.  ABDOMEN:  Soft, nontender, and nondistended with active bowel sounds.  He does have a umbilical hernia, which is reducible.  Otherwise, he does  not have any other scars, masses, or any other hernias.  MUSCULOSKELETAL:  All 4 extremities are  symmetrical with no cyanosis,  clubbing, or edema.  SKIN:  Warm and dry without any obvious masses, lesions, or rashes;  however, the patient does have multiple tattoos along his upper  extremities.  NEURO:  Cranial nerves II through XII appear to be grossly intact.  PSYCH:  The patient is alert and oriented x3 with an appropriate affect.   LABORATORY DATA AND DIAGNOSTICS:  Sodium 139, potassium 5.1, chloride  104, CO2 of 22, glucose 36, BUN 6, creatinine 0.59, total bilirubin 5.2.  Alkaline phosphatase 91, AST 73, ALT 21.  WBCs 15,700, hemoglobin 9.3,  hematocrit 27.1, platelet count is 532,000.  Ultrasound of the abdomen  shows gallbladder that is contracted and full of stones.  The common  duct is in the upper normal caliber; otherwise, he does not have any  pericholecystic fluid and no gallbladder wall thickness.   IMPRESSION:  1. Cholelithiasis.  2. Sickle cell anemia.  3. Sickle cell pain crisis.  4. History of pulmonary embolus.  5. Hyperbilirubinemia.   PLAN:  At this time, we will plan on getting a fractionated bilirubin to  determine if the patient's hyperbilirubinemia is coming from a direct or  indirect route.  Otherwise, we will also get a HIDA scan to determine if  the patient may have some sort of obstruction.  If this workup is  negative since the patient is currently not having any abdominal pain or  signs of acute cholecystitis, we will have the patient follow up in our  office and plan for an elective cholecystectomy at that time.  Otherwise  if the patient does have a positive HIDA scan, then we will probably  look  at removing the gallbladder while the patient is here.  In the meantime,  we will continue to follow the patient along with you.   Thank you for this consultation.      Letha Cape, PA      Wilmon Arms. Tsuei, M.D.  Electronically Signed    KEO/MEDQ  D:  01/09/2008  T:  01/10/2008  Job:  [id number]   cc:   Dr. Sherrie Mustache

## 2010-06-15 NOTE — Discharge Summary (Signed)
[name], [name]             ACCOUNT NO.:  [id number]   MEDICAL RECORD NO.:  [id number]          PATIENT TYPE:  INP   LOCATION:  1321                         FACILITY:  [facility]   PHYSICIAN:  Ladell Pier, M.D.   DATE OF BIRTH:  06/14/1980   DATE OF ADMISSION:  12/14/2006  DATE OF DISCHARGE:  12/21/2006                               DISCHARGE SUMMARY   DISCHARGE DIAGNOSES:  1. Pneumonia.  2. Pulmonary embolism.  3. Sickle cell crisis.  4. Left ankle pain.  5. Hyposplenism.  6. Cholelithiasis.  7. 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  8. 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  9. Leukocytosis and thrombocytosis.   DISCHARGE MEDICATIONS:  1. Percocet 10/325 b.i.d. p.r.n.  2. Lovenox 70 subcu twice daily for two days.  3. Coumadin 7.5 mg Monday, Wednesdays, Fridays, 10 mg all other days.  4. Levaquin 500 mg daily for five days.  5. Hydroxyurea daily.   FOLLOW-UP APPOINTMENTS:  The patient to follow up with Dr. Ronne [name]  tomorrow to follow up the results of x-ray of the ankle if not done  during hospitalization and follow up to get INR checked.   PROCEDURES:  None.   CONSULTANTS:  None.   HISTORY OF PRESENT ILLNESS:  The patient is a 30 year old man who has  sickle cell disease, came in with three to four-day history of bilateral  chest pain which is sharp and some pleuritic in nature.  He did not have  any cough or shortness of breath or any hemoptysis, feels like a painful  crisis pain that he has had before.  He says his hemoglobin is about 9.   Past medical history, family history, social history, medications,  allergies, review of systems are per admission H&P.   PHYSICAL EXAMINATION ON DISCHARGE:  VITAL SIGNS:  Temperature 98.7,  pulse 78, respirations 20, blood pressure 129/84, pulse oximeter 98% on  room air.  HEENT:  Normocephalic, atraumatic.  Pupils reactive to light.  Throat  without erythema.  CARDIOVASCULAR:  Regular rate and  rhythm.  LUNGS:  Clear bilaterally.  ABDOMEN:  Positive bowel sounds.  EXTREMITIES:  He has swelling with some tenderness in the left ankle but  good range of motion.   HOSPITAL COURSE:  PROBLEM #1 -  PNEUMONIA:  The patient was admitted to  the hospital, placed on IV antibiotics for pneumonia.  He did not have  any fever.  He does have some leukocytosis which we will continue to  monitor on an outpatient basis.   PROBLEM #2 -  PULMONARY EMBOLISM:  The patient had a D-dimer done that  was elevated.  Spiral CT showed PE.  The patient will be on Coumadin.  Coumadin will be followed by his primary care physician.  His INR on  discharge is 2.7.  The patient will follow up with Dr. Ronne [name] tomorrow  to recheck his INR.  As the patient is anxious to go home, will try to  get him set up with Lovenox as an outpatient.   PROBLEM #3 -  ANEMIA:  Hemoglobin stable.  He has been transfused 2  units while he was an inpatient.   PROBLEM #4 -  ANKLE PAIN:  As mentioned above, the patient will get x-  ray of the ankle and will follow up either outpatient or prior to  discharge with results of the x-ray for his ankle pain   DISCHARGE LABORATORY DATA:  PT 29.7, INR 2.7.  A wbc of 8.7, hemoglobin  8.8, platelets 582.  D-dimer, hepatitis C negative.   CT of the chest showed a suboptimal exam, however, nonocclusive right  lower lobe pulmonary embolism was identified. Worsened consolidation  with air bronchograms in the right lower lobe concerning for pneumonia  or question of pulmonary infarct, bilateral patchy air space disease in  the left lower lobe lingula and the medial right temporal lobe.      Ladell Pier, M.D.  Electronically Signed     NJ/MEDQ  D:  12/21/2006  T:  12/21/2006  Job:  [id number]   cc:   Lorelle Formosa, M.D.  Fax: [phone]

## 2010-06-15 NOTE — Discharge Summary (Signed)
[name], [name]             ACCOUNT NO.:  [id number]   MEDICAL RECORD NO.:  [id number]          PATIENT TYPE:  INP   LOCATION:  3036                         FACILITY:  [facility]   PHYSICIAN:  Beckey Rutter, MD  DATE OF BIRTH:  02-Feb-1980   DATE OF ADMISSION:  04/15/2008  DATE OF DISCHARGE:  04/16/2008                               DISCHARGE SUMMARY   PRIMARY CARE PHYSICIAN:  Lorelle Formosa, MD   REASON FOR ADMISSION:  Sickle cell pain.   HOSPITAL COURSE:  1. During hospital course, the patient was started on intravenous      fluid and intravenous narcotics, i.e., Dilaudid 2 mg IV every 1      hour.  The patient has stated that the narcotic is not enough and      he was requesting PCA when he had the cholecystectomy surgery back      in December 2009.   1. Upon evaluation of the patient's pain, the patient clinically seems      very stable with stable vital sign.  He is lying comfortably      arguing about the dose of the narcotic.  The patient stated that      the dose of Dilaudid alleviate the pain every time, but the pain      came back after 1 hour time.  I suggested adding oral narcotic to      his regimen especially with the fact that he is able to take p.o.      without any problem.  The patient did not want to continue      intravenous Dilaudid with the current dose and he threatened to      leave against medical advice.   Upon further discussion, the patient stated that he does not want any  prescription for his pain and he stated he wanted to go to [facility] for his  treatment.   The patient was put in for 2 units of blood transfusion upon admission,  which he refused.   1. The patient had hypokalemia 3.4, which is mild nevertheless,      prescription for K-Dur was put and the patient refused any      medication as long as he is not going to receive high dose of      intravenous Dilaudid.   DISCHARGE MEDICATIONS:  1. Sickle cell anemia with crisis.  2.  Chronic leukocytosis.  3. Chronic thrombocytosis.   DISCHARGE MEDICATIONS:  The patient refused a prescription and he signed  against medical advice.  No prescription was given, although he was  encouraged to continue on his medication hydromorphone, hydroxyurea,  folic acid, Ambien and Motrin as prior to the hospitalization.   LABORATORIES AND IMAGING DURING THIS HOSPITAL STAY:  The patient's lab  test is showing sodium 136, potassium 3.4, chloride 103, bicarb is 28,  glucose is 96, BUN is 7, creatinine 0.58, and reticulocyte is 9.4%.  Urinalysis is negative for nitrate and a small amount of leukocyte  esterase.  Microscopic urine showing white blood count of 0-2.  His  white blood counts as of  yesterday, April 15, 2008, is 23.4; hemoglobin  is 8.8; hematocrit is 26.2; and platelet count is 530.   CT head without contrast done on the first hour of April 16, 2008,  impression is no acute intracranial findings.  No change since prior CT.   On the first hour of April 16, 2008, the patient has chest x-ray with  impression showing no acute cardiopulmonary findings.   DISCHARGE PLAN:  As discussed above, the patient was starting to  increase his intravenous narcotic/Dilaudid and currently he wanted to  sign against medical advice.  The patient was advised to follow up with  his primary care physician as soon as possible and in fact today is a  business day, he could go to his primary physician.  The patient does  not seem to be agreeing to the follow up idea and it was very  belligerent and argumentative during the hospital stay and the discharge  encounter.      Beckey Rutter, MD  Electronically Signed     EME/MEDQ  D:  04/16/2008  T:  04/16/2008  Job:  [id number]

## 2010-06-15 NOTE — Discharge Summary (Signed)
[name], [name]             ACCOUNT NO.:  [id number]   MEDICAL RECORD NO.:  [id number]          PATIENT TYPE:  INP   LOCATION:  5040                         FACILITY:  [facility]   PHYSICIAN:  Claude Manges. Whitfield, M.D.DATE OF BIRTH:  October 01, 1980   DATE OF ADMISSION:  08/03/2006  DATE OF DISCHARGE:  08/06/2006                               DISCHARGE SUMMARY   ADMISSION DIAGNOSES:  1. Post-traumatic osteoarthritis of the right hip.  2. Sickle cell anemia.  3. Right olecranon bursitis.   DISCHARGE DIAGNOSES:  1. Post-traumatic osteoarthritis of the right hip, now status post      right total hip arthroplasty.  2. Acute sickle cell pain crisis.  3. Acute blood loss anemia secondary to surgery.  4. Hypokalemia.  5. Leukocytosis, now improving.  6. Sickle cell anemia.  7. Right olecranon bursitis.   SURGICAL PROCEDURES:  On August 03, 2006, Mr. Buice and underwent a right  total hip arthroplasty with removal of the acetabular screw by Dr. Claude Manges. Whitfield, assisted by Dr. Rinaldo Ratel and Rexene Edison PA-C.  He  had a DePuy ASR acetabular cup size 58 placed with a DePuy ASR  unifemoral implant size 51, a DePuy ASR taper sleeve adapter plus a  12/14 taper and a Prodigy hip stem with Redux Groove Porocoat 15-mm  diameter small stature right XL.   COMPLICATIONS:  None.   CONSULTANTS:  1. Family practice teaching service consult for medical management of      his sickle cell on August 03, 2006.  They followed him throughout the      hospitalization.  2. Physical therapy consult on August 04, 2006.   HISTORY OF PRESENT ILLNESS:  This 30 year old African American male with  history of sickle cell disease presented to Dr. Cleophas Dunker with history  of a right hip posterior fracture dislocation with grade 2 to 3  instability and bone fragments located in the acetabulum.  He had this  injury in 2000 and required ORIF of the acetabular fracture and removal  of the fragments.  He is now  presenting with right hip pain for the last  3 to 4 years that has gotten progressively worse.  He has failed intra-  articular injections of the right hip, and x-rays show arthritic  changes.  Because of this, he is presenting for a right hip replacement.   HOSPITAL COURSE:  Mr. Dill tolerated his surgical procedure well  without immediate postoperative complications.  He was transferred to  the floor.  His hemoglobin did drop initially to 7.5.  After surgery,  that was monitored.  On postop day #1, he was complaining of severe  pain.  Hemoglobin 7.5, hematocrit 21.8, white count 27.5.  His vitals  were stable.  Leg was neurovascularly intact.  His Foley was able to be  discontinued.  Medicine was not recommending transfusion at that time  and that was monitored.  He was started on therapy per protocol.  Unfortunately, he did discontinue his own drain that night.  No other  treatment was needed for that.   On postop day #2, he was still  complaining of pain but it had improved  somewhat.  He was voiding without difficulty but his hemoglobin had  dropped to 6.8 with hematocrit of 20.2.  White count was up at 34.2.  INR was 2.1.  He was subsequently transfused with 2 units of packed red  blood cells.  He tolerated that well.  He was switched off his heparin  and continued just on Coumadin for DVT prophylaxis.  His PCA was  continued at that time with Percocet p.r.n. and it was planned to  possibly switch him to p.o. meds the next day.  His potassium was a bit  low at 3.0 and that was supplemented orally.   On postop day #3, he was doing better.  He was able to be switched off  the PCA and controlled with p.o. pain meds.  His hemoglobin had improved  to 8 with hematocrit of 23.5.  White count was 26.8 and platelets 244.  He had a little bit a drainage from the wound but it otherwise looked  good and his leg was neurovascularly intact.  It was felt he was ready  for discharge home. and  was discharged home later that day.  He was  started on some Keflex due to the drainage from his hip.   DISCHARGE INSTRUCTIONS:  He can resume his regular prehospitalization  diet.   MEDICATIONS:  He may resume his preoperative meds with the exception of  adjustments of the pain med dose.  His preop meds were:  1. Hydroxyurea 500 mg 2 tablets orally every morning.  2. Folic acid 400 mcg 2 tablets orally every morning.  3. Oxycodone 5 p.r.n.   He is on some different dosages now.  New meds at this time include:  1. Coumadin 2 mg 1 tablet orally at 6 p.m. for 1 month with the dose      to be adjusted by home health pharmacy, 40, with no refill.  He is      to hold his dose tonight.  2. Percocet 5/325 mg 1 to 2 tablets orally every 4 hours p.r.n. for      pain, 60, with no refill.  3. Keflex 500 mg 1 tablet orally q.i.d., 20, with no refill.  4. Robaxin 500 mg 1 to 2 tablets orally every 6 hours p.r.n. for      spasms, 40, with no refill.   ACTIVITY:  He can be out of bed with partial weightbearing 50% or less  on the right leg with the use of the walker.  Please see the blue total  hip discharge sheet for further activity instructions.   WOUND CARE:  He may shower after no drainage from the wound for 2 days.  Please see the blue total hip discharge sheet for further wound care  instructions.   FOLLOWUP INSTRUCTIONS:  He is to follow up with Dr. Cleophas Dunker in our  office on Wednesday, August 16, 2006, and needs to call [phone] for that  appointment.  He is arranged for home health therapy per [facility].   LABORATORY DATA:  Hemoglobin/hematocrit ranged from 11 and 33 on June  30th to a low of 6.6 and 19.8 on the 5th, to 8 and 23.5 on July 6th.  White count ranged from 14.7 on the 30th to 11.4 on the 3rd to 34.2 on  the 5th to 26.8 on the 6th.  Platelets remained within normal limits.  Percent reticulocytes were 3.1.  RBCs were 2.42 million  per microliter  on July  4th.  PT and INR ranged from 14.5 and 1.1 on the 30th to 32 and  2.9 on July 6th.   Sodium dropped to a low of 134 on the 5th.  Potassium dropped to 3.0 on  the 5th and improved to 3.4 on the 6th.  Glucose ranged from 86 on the  3rd to 139 on the 4th.  BUN and creatinine ranged from 4 and 0.54 on the  30th to 3 and 0.51 on the 6th.  All other laboratory studies were within  normal limits.      Legrand Pitts Duffy, P.A.      Claude Manges. Cleophas Dunker, M.D.  Electronically Signed    KED/MEDQ  D:  10/03/2006  T:  10/03/2006  Job:  [id number]

## 2010-06-15 NOTE — Consult Note (Signed)
[name], [name]             ACCOUNT NO.:  [id number]   MEDICAL RECORD NO.:  [id number]          PATIENT TYPE:  INP   LOCATION:  4702                         FACILITY:  [facility]   PHYSICIAN:  Noralyn Pick. Eden Emms, MD, FACCDATE OF BIRTH:  Feb 23, 1980   DATE OF CONSULTATION:  06/22/2008  DATE OF DISCHARGE:                                 CONSULTATION   REASON FOR CONSULTATION:  We were asked to see for chest pain and  positive troponin.   HISTORY OF PRESENT ILLNESS:  [name] is a 30 year old patient with  sickle cell crisis.  He has had multiple episodes before.  He was  admitted to the hospital on Jun 21, 2008 with severe generalized body  aches and pains.  He has severe lower extremity pain.  He seems to be  drug seeking and actually request a certain dose of Dilaudid PCA pump.   He apparently has had previous sickle cell crises before.  He has no  previous history of coronary artery disease.  The patient did have a 2-D  echocardiogram performed on January 23, 2008 and there was no evidence  of pulmonary hypertension, normal LV function.  We were asked to see him  as his troponin was 0.15.  His EKG is nonacute with sinus rhythm and  LVH.   He currently is not complaining of chest pain to me, but primarily lower  extremity pain.  In general, his chest pain was sharp and central and  related to his diffuse myalgias and not necessarily exertional.  It has  been going on for the last 48 hours but again his pain is primarily  localized below the waist.   He indicates that only Dilaudid seems to help.   His review of systems is otherwise negative.   PAST MEDICAL HISTORY:  1. Sickle cell disease with a baseline hemoglobin of 8-9.  2. History of PE in 2007.  3. History of pneumonia.  4. History of thrombocytosis.  5. Previous alcohol and marijuana use.  6. Previous right hip replacement in June 2008.  7. History of cholecystectomy.  8. History of umbilical hernia repair.   MEDICATIONS:  Prior to admission included Vicodin, hydroxyurea,  ibuprofen, and folic acid.   ALLERGIES:  He indicates that morphine causes hives.   SOCIAL HISTORY:  The patient does not work.  He is on disability.  He  smokes both marijuana and cigarettes and has alcohol on a daily basis.   FAMILY HISTORY:  Mother has a history of sickle cell trait and diabetes.  Father had gunshot wound and died prematurely.   REVIEW OF SYSTEMS:  Four-point review of systems is otherwise negative.   PHYSICAL EXAMINATION:  GENERAL:  A somewhat belligerent thin black male  with multiple tattoos.  VITAL SIGNS:  His blood pressure is 160/88, respirations 20, pulse 105,  and temperature 100.3.  HEENT:  Unremarkable.  NECK:  Carotids are without bruit.  No lymphadenopathy, JVP elevation.  LUNGS:  Clear to diaphragmatic motion.  No wheezing.  S1-S2 with normal  heart sounds.  PMI normal.  ABDOMEN:  Benign.  Bowel sounds positive.  Status post hernia repair and  cholecystectomy.  No AAA.  EXTREMITIES:  Distal pulses were intact.  He has some muscular rigidity  in both lower extremities.  NEURO:  Nonfocal.  He has a ptosis of the right eye.  SKIN:  Warm and dry.   LABORATORY DATA:  EKG shows sinus rhythm with LVH.  Cardiac panel shows  negative CPKs and a troponin of 0.15 increasing to 0.27.  TSH is normal  at 1.8.  CBC shows a white count of 17.1 and hematocrit of 26.3.   IMPRESSION:  1. Atypical pain, nondescript troponin bump with negative CPK.  I do      not think this represents a coronary syndrome.  I would place the      patient on aspirin therapy.  He has had a 2-D echocardiogram fairly      recently that showed no evidence of pulmonary hypertension with a      sickle cell trait and normal left ventricular function with no      evidence of hypertrophic cardiomyopathy.  I do not think further      cardiac workup is indicated here in the hospital.  2. Sickle cell crisis.  The patient needs to  be hydrated, treated with      analgesics, and wear oxygen.  He seems to indicate he needs more      pain control and is rather belligerent about this.  I will leave it      up to the Primary Service as to whether or not he gets a Dilaudid      PCA pump.  3. Relative hypertension.  The patient may benefit from use of the      calcium channel blocker.  He is currently taking Norvasc 10 mg a      day.  I would continue this for his blood pressure.  I suspect it      will normalize when his pain is controlled.   Given his previous history of drug abuse, I would probably avoid beta-  blockers.      Noralyn Pick. Eden Emms, MD, [facility]  Electronically Signed     PCN/MEDQ  D:  06/22/2008  T:  06/23/2008  Job:  [id number]

## 2010-06-15 NOTE — H&P (Signed)
[name], [name]             ACCOUNT NO.:  [id number]   MEDICAL RECORD NO.:  [id number]          PATIENT TYPE:  INP   LOCATION:  6715                         FACILITY:  [facility]   PHYSICIAN:  Eduard Clos, MDDATE OF BIRTH:  07-16-1980   DATE OF ADMISSION:  11/13/2007  DATE OF DISCHARGE:                              HISTORY & PHYSICAL   PRIMARY CARE PHYSICIAN:  Lorelle Formosa, M.D.   CHIEF COMPLAINT:  Right sided chest pain.   HISTORY OF PRESENT ILLNESS:  A 30 year old male with a known history of  sickle cell disease, previous history of pulmonary embolism off  Coumadin, ongoing tobacco abuse, presented with chest pain.  The patient  has been having chest pain and mild right upper quadrant pain over the  last 24 hours since he woke up yesterday.  The patient had a CT of the  chest and sonogram which ruled out acute cholecystitis and any pulmonary  embolism.  The patient has been admitted for further management of his  acute painful crisis due to sickle cell disease.  The patient denies any  shortness of breath, weakness of limbs, dizziness, nausea, vomiting,  diarrhea, fever or chills.   PAST MEDICAL HISTORY:  1. History of sickle cell disease.  2. History of pulmonary embolism.  3. Ongoing tobacco abuse.   PAST SURGICAL HISTORY:  1. Right hip replacement.  2. Recent cyst removed from his right mandible region, that was      yesterday.   MEDICATIONS PRIOR TO ADMISSION:  1. Hydroxyurea 500 mg p.o. daily.  2. Folic acid 2 mg p.o. daily.  3. Vicodin 7.5 p.o. q.6 p.r.n.   SOCIAL HISTORY:  The patient smokes cigarettes and has been advised to  quit smoking.  Denies any alcohol or drug use.   ALLERGIES:  NO KNOWN DRUG ALLERGIES.   FAMILY HISTORY:  Nothing contributory.   REVIEW OF SYSTEMS:  As per history of present illness, nothing else  significant.   PHYSICAL EXAMINATION:  GENERAL:  The patient examined at bedside, no  acute distress.  VITAL SIGNS:   Blood pressure 117/76, pulse 68 per minute, temperature  98, respirations 18 per minute.  O2 sat 99%.  HEENT:  Anicteric, no pallor.  CHEST:  Bilateral air entry present.  No rhonchi, no crepitation.  HEART:  S1-S2 heard.  ABDOMEN:  Soft, nontender.  Bowel sounds heard.  No guarding, no  rigidity.  CNS:  The patient is alert and oriented to time, place and person.  Moves upper and lower extremities 5/5.  EXTREMITIES:  Peripheral pulses felt.  No edema.   LABORATORY DATA:  CBC - WBC 98.4, hemoglobin 8.3, hematocrit 25.1,  platelets 418, neutrophils 79%, retic count percentage is 9.2, PT/INR  14.8 and 1.1.  D-dimer 1.21.  Complete metabolic panel; sodium 139,  potassium 3.6, chloride 106, carbon dioxide 24, glucose 101, BUN 9,  creatinine 0.7, total bilirubin 3.8, indirect is 3.3.  AST 35, ALT 30,  total protein 6.5, albumin 4.1, calcium 8.5, lipase 19.  CT angio of the  chest negative for any large embolus.  Bilateral lower lobe  opacities,  cardiomegaly and  enlargement  gallstones.  Ultrasound of the abdomen:  Cholelithiasis likely, contracted gallbladder, no evidence of acute  cholecystitis.   ASSESSMENT:  1. Sickle cell pain crisis.  2. 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  3. History of pulmonary embolism, off Coumadin.  CT of the chest      negative.  4. Tobacco abuse.   PLAN:  Admit the patient to telemetry.  Cycle cardiac enzymes.  Place  the patient on IV fluids, pain medication.  Repeat CMET and CBC in the  a.m.  Further recommendations as the patient's condition evolves.      Eduard Clos, MD  Electronically Signed     ANK/MEDQ  D:  11/14/2007  T:  11/14/2007  Job:  [id number]

## 2010-06-15 NOTE — Op Note (Signed)
NAMECARTEZ, MOGLE             ACCOUNT NO.:  [id number]   MEDICAL RECORD NO.:  [id number]          PATIENT TYPE:  AMB   LOCATION:  DSC                          FACILITY:  [facility]   PHYSICIAN:  Jefry H. Pollyann Kennedy, MD     DATE OF BIRTH:  [birth year]   DATE OF PROCEDURE:  11/12/2007  DATE OF DISCHARGE:                               OPERATIVE REPORT   PREOPERATIVE DIAGNOSIS:  Right ear lobe cyst.   POSTOPERATIVE DIAGNOSIS:  Right ear lobe cyst.   PROCEDURE:  Excision of right ear lobe cyst with primary closure.   SURGEON:  Jefry H. Pollyann Kennedy, MD   ANESTHESIA:  Local anesthesia was used with monitored anesthesia care.   COMPLICATIONS:  No complications.   FINDINGS:  Skin line cyst arising in the skin just anterior to the ear  lobe with a secondary subcutaneous skin tract that was also identified  and was in continuity.   BLOOD LOSS:  No blood loss.   Specimen was sent for pathologic evaluation.   HISTORY:  A 30 year old with a history of sickle cell anemia has had a  slowly enlarging mass adjacent to the right ear lobe for several years.  Risks, benefits, alternatives, and complications of the procedure were  explained to the patient, he seemed to understand and agreed to surgery.   PROCEDURE:  The patient was taken to the operating room and placed on  the operating table in the supine position.  Following induction of  intravenous sedation, the face was prepped and draped in the standard  fashion on the right side.  A marking pen was used to outline an ellipse  of skin around the opening of the cyst.  Xylocaine with epinephrine was  infiltrated into the surrounding skin.  After adequate local anesthesia  was achieved, the ellipse of skin was incised using a 15 scalpel.  Sharp  dissection was continued down to the subcutaneous tissue until the wall  of the cyst was identified.  Using gentle retraction on the skin edges,  the cyst was dissected out of its surrounding tissue in its  entirety.  The secondary tract was identified and was also completely resected.  There was adherence to the surrounding fibrous tissue as well as to the  skin about 1.5 cm anterior and inferior to the skin opening.  Care was  taken not to damage any further skin and this was accomplished  successfully.  Needle point cautery was used on a low setting of 8 watts  for hemostasis.  Once the specimen was delivered, the wound was  irrigated with the local  anesthetic solution.  The wound was closed in 2 layers using 4-0 chromic  on the subcuticular layer and a running 5-0 nylon on the skin.  Bacitracin was applied.  Facial nerve was working normally.  The patient  was awakened and transferred to Recovery in stable condition.      Jefry H. Pollyann Kennedy, MD  Electronically Signed     JHR/MEDQ  D:  11/12/2007  T:  11/12/2007  Job:  [id number]

## 2010-06-15 NOTE — Op Note (Signed)
[name], [name]             ACCOUNT NO.:  [id number]   MEDICAL RECORD NO.:  [id number]          PATIENT TYPE:  INP   LOCATION:  4702                         FACILITY:  [facility]   PHYSICIAN:  Claude Manges. 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   PROCEDURE:  1. Right total hip replacement (metal-on-metal).  2. Removal of acetabular of screw.   SURGEON:  Claude Manges. Cleophas Dunker, M.D.   ASSISTANT:  Dr. Chaney Malling, Rexene Edison, [facility]   ANESTHESIA:  General.   BLOOD LOSS:  About 800 mL.  We transfused one unit of packed cells.   COMPLICATIONS:  None.   COMPONENTS:  DePuy prodigy 15 mm diameter small stature hip stem, a  DePuy ASR 58 mm outer diameter acetabular cup with a 51 mm diameter  DePuy ASR unifemoral head implant with a taper sleeve adaptor with a +8  neck length.   PROCEDURE:  The patient comfortable on operating table and under general  orotracheal anesthesia, the patient was placed in the lateral decubitus  position with the right side up and secured to the operating table with  the Innomed hip system.  The right hip was then prepped with Betadine  scrub from iliac crest to below the knee followed by DuraPrep.  Sterile  draping was performed.   The patient had a prior ORIF of an acetabular fracture.  A portion of  that incision had widened and was utilized and then extended over the  greater trochanter distally.  The widened scar was elliptically excised.  Small bleeders were Bovie coagulated.  The superficial fascia was  incised with the Bovie.  The iliotibial band was identified and incised  along length of the skin incision with the Bovie.  Self-retaining  retractors were  inserted.  Hemostasis was provided with Bovie  coagulation.   I did not see an obvious short external rotator as a result of the  previous surgery and scar I carefully incised the tissue from behind the  greater trochanter to the level of the capsule.  The capsule was quite  hypertrophic.  This was incised along the femoral neck and head.  There  was moderate amount of synovitis and very thick capsule.  There was a  small amount of joint fluid that was clear.  The head was then  visualized.  I removed synovial tissue circumferentially from about the  neck and head and then dislocated the head with a little difficulty  posteriorly.  The head was misshapen, large osteophytes, little if any  articular cartilage remaining on the head and there was obvious  avascular necrosis with a ping-pong effect of the head that I could  impact with my gloved finger.   There osteophytes along the posterior aspect of the acetabulum.  Some of  these were removed as they appeared to be loose.  A  portion of the  previous acetabular plate was visible.  There was one screw in the very  posterior aspect of the acetabulum that did not appear to interfere with  our reaming.  One screw did superiorly so we visualized the plate and  removed it with the small hexagonal head screwdriver.   Prior to this we had amputated the femoral head using the AML calcar  guide.  A starter hole was then made in piriformis fossa and reaming was  performed up to 14.5 to accept a 15 mm prosthesis which we had templated  preoperatively we had very nice purchase along the endosteal surface  with a 14.5.  Rasping was then performed sequentially to 15 with about  15-20 degrees of anteversion and fit perfectly flush on the calcar.  The  level of the cut was almost a fingerbreadth proximal to the lesser  trochanter.   Retractor was then placed about the acetabulum.  Abundant synovitis was  removed.  The redundant capsule was also  removed with the Bovie.  Reaming was performed sequentially to 57 mm to accept a 58 mm femoral  head.  We had circumferential bleeding and thought we had very nice  depth.   We elected to use the DePuy ASR system to allow a large femoral head.  We had templated a 58-mm component preoperatively and felt that this was  the optimal size.  We trialed a 56-mm component that had good rim fit  and would completely seat.  The 58-mm component would not completely  seat but had good rim fit, so accordingly we impacted the final 58 mm  component.   The femoral component was then inserted and we trialed several neck  lengths and even the Prodigy with an attempt to stabilize a range of  motion with a +8 neck, a 51 mm outer diameter femoral head with the  Prodigy stem.  We felt we had perfect stability and no toggling and the  patient was about 3/8 to half an inch short preoperatively and I felt  like the leg lengths were now symmetrical.   Trial components removed until we further impacted the femoral the  acetabular component, probably 1 or 2 mm and cleaned the acetabulum with  the irrigation.   The final 15 mm small stature Prodigy stem was then impacted into the  femoral canal.  We again trialed the +8 neck with a 51 head and had  perfect stability.   Trial component was removed.  The final 51 outer diameter ASR head with  a +8 neck taper was inserted, acetabulum was inspected without evidence  of loose material.  The final construct was reduced.  Again through full  range of motion we had excellent stability both flexion and extension.  Again felt the leg lengths were symmetrical.   The wound was again irrigated with saline solution.  The capsule was  closed anatomically with a #1 Ethibond.  Scar tissue incorporating short  external rotators were then closed with same material.  Iliotibial band  was closed with running 0-0 Vicryl, subcu closed in several layers with  0-0 and 2-0 Vicryl,  skin closed with skin clips.  I did use a  Hemovac.  The patient was transfused 1 unit of packed cells as we  checked the hematocrit at the end of the procedure and with a hemoglobin  of 9, we thought we lost about 800 mL.   The patient tolerated well without complications.  Claude Manges. Cleophas Dunker, M.D.  Electronically Signed     PWW/MEDQ  D:  08/03/2006  T:  08/04/2006  Job:  [id number]

## 2010-06-15 NOTE — H&P (Signed)
NAMEPOPE, BRUNTY             ACCOUNT NO.:  [id number]   MEDICAL RECORD NO.:  [id number]          PATIENT TYPE:  INP   LOCATION:                               FACILITY:  [facility]   PHYSICIAN:  Renee Ramus, MD       DATE OF BIRTH:  [birth year]   DATE OF ADMISSION:  09/25/2007  DATE OF DISCHARGE:                              HISTORY & PHYSICAL   HISTORY OF PRESENT ILLNESS:  The patient is a 30 year old male who is  admitted secondary to increasing pain in his lower extremities  bilaterally and chest.  The patient does have long-standing history of  sickle cell anemia.  He has had multiple admissions and ER visits  secondary to sickle cell crises.  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  The patient has been admitted for further evaluation and  treatment.  Currently, the patient denies fevers, chills, night sweats,  nausea, vomiting, chest pain, shortness of breath, PND, or orthopnea.   PAST MEDICAL HISTORY:  1. Sickle cell disease.  2. Status post THA.  3. History of pulmonary embolism approximately 1 year prior with 3      months of anticoagulation.  4. Chronic leukocytosis.  5. Cholelithiasis without evidence of cholecystitis.   MEDICATIONS:  1. Ibuprofen 800 mg p.o. q.8 h. p.r.n. pain.  2. Hydroxyurea 500 mg p.o. b.i.d.  3. Vicodin 7.5/325 1 p.o. q.6 h. p.r.n. pain.   ALLERGIES:  MORPHINE which causes hives.   SOCIAL HISTORY:  The patient reports smoking approximately 1/2-1 pack  per day, occasional alcohol use and marijuana use.   FAMILY HISTORY:  Not available.   REVIEW OF SYSTEMS:  All other comprehensive review of systems are  negative.   PHYSICAL EXAMINATION:  GENERAL:  He is a well-developed, well-nourished  black male, currently in no apparent distress.  VITAL SIGNS:  Temperature 97.8, blood pressure 129/82, heart rate 75,  and respiratory rate 22.  He is 97% on room air.  HEENT:  No jugular venous distention or lymphadenopathy.  Oropharynx is  clear.  Mucous membrane is pink and moist.  TMs are clear bilaterally.  Pupils equal, reactive to light and accommodation.  Extraocular muscles  are intact.  CARDIOVASCULAR:  Regular rate and rhythm without murmurs, rubs, or  gallops.  PULMONARY:  Lungs are clear to auscultation bilaterally.  ABDOMEN:  Soft, nontender, and nondistended without hepatosplenomegaly.  Bowel sounds are present.  He has no rebound or guarding.  EXTREMITIES:  He has no clubbing, cyanosis, or edema.  He has good  peripheral pulses in dorsalis pedis and radial arteries.  He is able to  move all extremities.  NEUROLOGIC:  Cranial nerves II through XII are grossly intact.  He has  no focal neurological deficits.   LABORATORY DATA:  White count 14.8, H and H 9.2 and 27, MCV 101, and  platelets 433.  Sodium 141,  potassium 3.2, chloride 106, bicarb 91, BUN  6, creatinine 0.7, and glucose 91.  His previous retic count was 8.7.  His UA shows no signs of infection.   STUDIES:  Chest x-ray shows cardiomegaly and chronic scars from  bronchitis, but no acute disease.   ASSESSMENT AND PLAN:  1. Sickle cell crisis.  We will place the patient on Dilaudid drip      given the intense amount of his reported pain.  We will also treat      with empiric Toradol and IV fluids.  We will check CBC with      peripheral smear to investigate for sickle cell population.  We      will also check  lactate, CRP, and INR as well as prophylaxis O2      and check LVH levels.  We will consider steroids as      antiinflammatory if the patient's pain does not resolve.  We will      also consider CTPA given his history of pulmonary embolism and      check a D-dimer.  2. Pulmonary embolism history as above.  3. Tobacco abuse.  The patient declines patch  at this time.  4. Disposition.  The patient is full code.   H&P was constructed by revealing past medical history, confirming with  emergency medical room physician revealing the emergency medical record.   TIME SPENT:  One hour.      Renee Ramus, MD  Electronically Signed     JF/MEDQ  D:  09/25/2007  T:  09/26/2007  Job:  [id number]   cc:   Lorelle Formosa, M.D.

## 2010-06-15 NOTE — Discharge Summary (Signed)
NAMEMARQUEST, [name]             ACCOUNT NO.:  [id number]   MEDICAL RECORD NO.:  [id number]          PATIENT TYPE:  INP   LOCATION:  5159                         FACILITY:  [facility]   PHYSICIAN:  Beckey Rutter, MD  DATE OF BIRTH:  1980/02/22   DATE OF ADMISSION:  04/17/2008  DATE OF DISCHARGE:  04/18/2008                               DISCHARGE SUMMARY   PRIMARY CARE PHYSICIAN:  Lorelle Formosa, MD   REASON FOR ADMISSION:  Positive blood culture.   HOSPITAL COURSE:  The patient was called because of positive blood  culture.  He had the blood culture repeated and he was observed for 23  hours in the hospital without evidence of fever.  The patient remained  to have leukocytosis, nevertheless, the leukocytosis is chronic to him.  The patient was complaining of lower extremity pain and he was started  on Dilaudid PCA and currently he is pain-free.  The patient is stable  for discharge.   The patient was admitted on April 16, 2008 for leg pain and headaches  and signed against medical advice on the same day.  That is when the  blood culture was drawn and currently it is concluded that the blood  culture is a contamination rather than a genuine culture.  The patient  had 1 set of blood culture drawn and we will continue to follow up this  culture.  He is aware and agreeable to discharge plan.   DISCHARGE MEDICATIONS:  1. Hydroxyurea 500 mg daily.  2. Vicodin 7.5/325 mg every 6 hours p.r.n.  3. Ibuprofen 400-800 mg daily p.r.n.  4. Folic acid 1 mg daily.   DISCHARGE DIAGNOSES:  1. Recurrent pain crisis for sickle cell anemia.  2. Sickle cell anemia.  3. History of pulmonary embolism.  4. Questionable drug-seeking behavior.   DISCHARGE PLAN:  The patient is stable for discharge today.  He was  advised to follow up with Dr. [name], his primary physician within 1  week.  No prescription was written at this time.      Beckey Rutter, MD  Electronically  Signed     EME/MEDQ  D:  04/18/2008  T:  04/19/2008  Job:  [id number]   cc:   Lorelle Formosa, M.D.

## 2010-06-15 NOTE — Discharge Summary (Signed)
[name], [name]             ACCOUNT NO.:  [id number]   MEDICAL RECORD NO.:  [id number]          PATIENT TYPE:  INP   LOCATION:  4742                         FACILITY:  [facility]   PHYSICIAN:  Elliot Cousin, M.D.    DATE OF BIRTH:  07-30-1980   DATE OF ADMISSION:  01/08/2008  DATE OF DISCHARGE:                               DISCHARGE SUMMARY   ANTICIPATED DATE OF DISCHARGE:  December 14 or January 15, 2008.   DISCHARGE DIAGNOSES:  1. Sickle cell disease with crisis.  2. Right upper quadrant abdominal pain secondary to chronic calculous      cholecystitis.  3. Chronic calculous cholecystitis and umbilical hernia, status post      laparoscopic cholecystectomy with intraoperative cholangiogram and      umbilical hernia repair per Dr. Corliss Skains on January 11, 2008.  4. Persistent leukocytosis.  5. Recurrent hypokalemia.  6. 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  7. Hyperbilirubinemia.  8. History of pulmonary embolism.   DISCHARGE MEDICATIONS:  1. Percocet 5 mg 1 tablet every 4-6 hours p.r.n. pain.  2. Ibuprofen 800 mg every 8 hours as needed.  3. Hydroxyurea 500 mg daily.  4. Potassium chloride 20 mEq daily.  5. Folic acid 1 mg daily.  6. Avelox 400 mg daily.   CONSULTATIONS:  Wilmon Arms. Corliss Skains, M.D. and colleagues.   PROCEDURE PERFORMED:  1. Status post laparoscopic cholecystectomy with intraoperative      cholangiogram and umbilical hernia repair on January 11, 2008 by      Dr. Corliss Skains.  2. HIDA scan on January 10, 2008.  The results revealed      nonvisualization of the gallbladder.  Demonstration of patency of      the common bile duct.  3. Ultrasound of the abdomen on January 09, 2008.  The results      revealed that the gallbladder was contracted and full of stones.      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  Bilateral nephromegaly.  4. CT angiogram of the chest on January 09, 2008.  The results      revealed no acute  pulmonary thrombosed embolism.  Stable      appearance of  the mediastinum and lungs.   HISTORY OF PRESENT ILLNESS:  The patient is a 30 year old man with a  past medical history significant for sickle cell disease and pulmonary  embolism, who presented to the emergency department on January 08, 2008  with a chief complaint of right upper quadrant pain and chest pain.  Per  his history, his symptoms were consistent with sickle cell pain crisis.  When he was evaluated in the emergency department, he was  hemodynamically stable and afebrile.  His cardiac markers were negative.  His hemoglobin was 9.3.  His white blood cell count was 15.7.  His  reticulocyte count was elevated at 254.  His total bilirubin was 5.2 and  his SGOT was 73.  The patient was admitted for further evaluation and  management.   For additional details please see the dictated history and physical.   HOSPITAL COURSE:  1. SICKLE CELL DISEASE WITH PAIN CRISIS.  The patient  was started on      IV fluid volume repletion with half-normal saline.  Pain management      was instituted with as-needed Dilaudid.  He was maintained on      hydroxyurea.  Folic acid was added at 2 mg daily.  Prophylactic      Protonix and heparin were started.  Because of the ongoing chest      pain and abdominal pain, a CT angiogram of the chest and an      abdominal ultrasound were ordered.  The CT angiogram of the chest      was negative for PE.  The ultrasound of the abdomen revealed a      contracted gallbladder that was full of stones.  Given these      findings, it was felt that the patient's abdominal pain was likely      secondary to biliary colic.  Therefore general surgeon Dr. Corliss Skains      was consulted.  (See below).  2. LAPAROSCOPIC CHOLECYSTECTOMY AND UMBILICAL HERNIA REPAIR.  Dr.      Corliss Skains evaluated the patient on January 09, 2008.  Initially, the      patient refused surgical intervention.  Dr. Corliss Skains however did order      a HIDA scan for further evaluation.  The HIDA scan revealed       nonvisualization of the gallbladder.  He discussed the need for a      laparoscopic cholecystectomy with the patient again and the patient      eventually agreed.  The laparoscopic cholecystectomy was performed      on January 11, 2008.  The patient also had an umbilical hernia,      which was repaired as well.  The intraoperative cholangiogram was      essentially negative.  From a surgical standpoint, the patient is      stable.  He will need to follow up with Dr. Corliss Skains in 2-3 weeks.  3. ANEMIA SECONDARY TO SICKLE CELL DISEASE.  At the time of the      initial hospital assessment, the patient's hemoglobin was 9.3.      Over the course of the hospitalization, it slowly fell to 7.3 as of      today.  The patient has not been transfused yet.  Packed red blood      cells have been typed and crossed and if his hemoglobin remains 7.5      or below, he will be transfused in the morning.  4. PERSISTENT LEUKOCYTOSIS.  At the time of the initial hospital      assessment, the patient's WBC was 15.7.  It slowly increased to      25.5 on January 12, 2008.  As of today, it is 27,000.  He was      started on antibiotic treatment with Avelox several days ago.  He      has remained completely afebrile during the hospitalization.  The      leukocytosis is thought to be secondary to the sickle cell pain      crisis and possibly from post-operation demargination. There are no      acute signs of pneumonia or an urinary tract infection.  A      consideration will be made to broaden the antibiotic therapy if the      patient's white blood cell count does not improve.  5. HYPOKALEMIA.  The patient's serum potassium has been ranging from  3.2-3.3 during the hospitalization.  He has been treated with      potassium chloride orally and in the IV fluids.  Blood magnesium      level was assessed and was found to be within normal limits at 1.9.      He will continue to be treated with potassium chloride as  needed.   DISCHARGE DISPOSITION:  The patient is approaching hospital discharge.  However, his white blood cell count continues to increase and his  hemoglobin fell to 7.3 today.  If his white blood cell count is still  elevated, antibiotic treatment may need to be modified.  If his  hemoglobin remains at 7.3 or below in the morning, he will be transfused  1 unit of packed red blood cells.  He will need to follow up with Dr.  Corliss Skains in 2-3 weeks and with Dr. [name] in 1-2 weeks.      Elliot Cousin, M.D.  Electronically Signed    DF/MEDQ  D:  01/13/2008  T:  01/13/2008  Job:  [id number]   cc:   Lorelle Formosa, M.D.  Wilmon Arms. Tsuei, M.D.

## 2010-06-15 NOTE — H&P (Signed)
[name], [name]             ACCOUNT NO.:  [id number]   MEDICAL RECORD NO.:  [id number]          PATIENT TYPE:  INP   LOCATION:  0108                         FACILITY:  [facility]   PHYSICIAN:  Hettie Holstein, D.O.    DATE OF BIRTH:  13-Jan-1981   DATE OF ADMISSION:  11/15/2006  DATE OF DISCHARGE:  11/15/2006                              HISTORY & PHYSICAL   PRIMARY CARE PHYSICIAN:  Lorelle Formosa, M.D.   CHIEF COMPLAINT:  Right-sided chest pain pleuritic in nature.   HISTORY OF PRESENT ILLNESS:  [name] is a 30 year old male with a  history of sickle cell disease with about four times per year  presentations with crises, generally followed at [facility].  He  was here in the emergency department on November 06, 2006 where he was  initiated on antibiotics for the same complaint where he had a left  lingular lobe airspace disease, and he was discharged on a Z-Pak.  In  any event, his pain returns today.  He underwent CT scan in the  emergency department that revealed no evidence of pulmonary emboli but  does reveal bilateral infiltrates.   PAST MEDICAL HISTORY:  1. Sickle cell disease as noted above.  2. History of acute chest syndrome in the past.  3. History of hyposplenism.  4. 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  5. History of cholelithiasis.  6. History of chronic mild dilation of the appendix per previous      imaging studies.   MEDICATIONS AT HOME:  1. Azithromycin.  2. Oxycodone.   ALLERGIES:  MORPHINE SULFATE; he develops hives and rash.   SOCIAL HISTORY:  He smokes tobacco.  He works intermittently.  Only  occasional weekend alcohol.   FAMILY HISTORY:  Significant for diabetes in his mother who is alive and  well.  The patient's brother has sickle cell trait.  His father died  secondary to a gunshot wound.   REVIEW OF SYSTEMS:  He had been in his usual state of health with the  exception of pain in  his chest.  No joint pains.  No nausea, vomiting or  diarrhea.  Otherwise unremarkable.   PHYSICAL EXAMINATION IN THE EMERGENCY DEPARTMENT:  VITAL SIGNS:  Blood  pressure was 127/73, temperature 99.3, heart rate 90, respirations 18,  O2 saturation 98%.  GENERAL:  The patient is alert, in no acute distress, nontoxic in  appearance.  HEENT:  Head is normocephalic, atraumatic.  Extraocular muscles are  intact.  NECK:  Supple, nontender, without thyromegaly or mass.  CARDIOVASCULAR:  Normal S1-S2.  LUNGS:  Clear.  The breath sounds are diminished, and some scattered  rhonchi in the bases.  ABDOMEN:  Soft, nontender.  No rebound or guarding.  Bowel sounds are  present.  EXTREMITIES:  Lower extremities reveal no calf tenderness or edema.  Peripheral pulses are symmetrical and palpable bilaterally.   LABORATORY DATA:  WBC of 24.5, hemoglobin 95, platelet count 577.  Sodium 137, potassium 34, BUN 5, creatinine 0.47 glucose of 110, AST and  ALT  of 35 and 17, albumin 3.4.  D-dimer is 1.98.  CT scan of the chest  reveals negative for PE.  Bilateral infiltrates as noted above.   ASSESSMENT:  1. Bilateral pneumonia.  2. Sickle cell pain crisis.  3. History of tobacco abuse.   PLAN AT THIS TIME:  The recommendation is to admit Mr. Supinski,  administer IV fluids and IV antibiotics, as well as administer narcotics  for pain.  He has exhibited the desire to leave despite medical  recommendations.  I have encouraged him to stay, and have written orders  for him to be admitted.      Hettie Holstein, D.O.  Electronically Signed     ESS/MEDQ  D:  11/15/2006  T:  11/16/2006  Job:  [id number]   cc:   Lorelle Formosa, M.D.  Fax: [phone]

## 2010-06-15 NOTE — H&P (Signed)
[name], [name]             ACCOUNT NO.:  [id number]   MEDICAL RECORD NO.:  [id number]          PATIENT TYPE:  EMS   LOCATION:  MAJO                         FACILITY:  [facility]   PHYSICIAN:  Marcellus Scott, MD     DATE OF BIRTH:  09-20-1980   DATE OF ADMISSION:  08/18/2007  DATE OF DISCHARGE:                              HISTORY & PHYSICAL   PRIMARY CARE PHYSICIAN:  Lorelle Formosa, M.D.   CHIEF COMPLAINT:  Chest pain and lower extremity pain.   HISTORY OF PRESENT ILLNESS:  [name] is a 30 year old African  American male patient with history of sickle cell disease, chronic  leukocytosis and thrombocytosis, history of pulmonary embolism who used  to be on chronic anticoagulation.  He indicates that he was well and in  his usual state of health with no complaints when he went to bed last  night.  This morning he woke up with pain across his anterior chest and  the sides of his chest and pain in his lower extremities.  It is a sharp  stabbing kind of pain which is 9/10 in severity.  He denies any  associated fever, chills, rigors, cough, sore throat or dyspnea. There  is no history of sickly contacts or travel. He was recently treated for  presumed pneumonia at the end of June 2009 with a course of empiric  Zithromax.  Initial chest x-ray in the ED revealed findings suggestive  of pneumonia.  However, given the history of pulmonary embolism a CT  angiogram was obtained which was inadequate study for PE.  In any event  the radiologist has indicated that there was no large central pulmonary  embolism.  He does have findings suggestive of right mid lobe and lower  lobe pneumonia and previous scarring.  Also, the patient has received  multiple doses of Dilaudid and 1 mg of Ativan.  The patient currently is  somnolent but arousable enough to answer questions.  He indicates that  he stopped his Coumadin voluntarily a few months ago.   PAST MEDICAL HISTORY:  1. Sickle cell  disease with multiple painful crises.  2. Chronic leukocytosis.  3. Chronic thrombocytosis.  4. History of pulmonary embolism.  He indicates that he had only one      episode of this in the past and denies history of DVTs.  5. History of chronic Coumadin in the past with noncompliance now.      Unclear why he was on the chronic Coumadin.  6. Hyposplenism.  7. Cholelithiasis.  8. Left AMA on recent admission.  9. Multiple pneumonias.   PAST SURGICAL HISTORY:  Right hip surgery.   ALLERGIES:  MORPHINE causes hives.   MEDICATIONS:  The patient is unable to recollect all and the doses also.  1. Hydroxyurea one tablet p.o. b.i.d.  2. Hydrocodone.   FAMILY HISTORY:  Father died of gunshot injury.  Mother has history of  diabetes.   SOCIAL HISTORY:  The patient lives alone.  He cuts hair for a living.  He smokes cigars, unclear amount.  He drinks 3-4 beers on weekends.  He  also volunteers smoking marijuana.  He denies history of any other drug  abuse.   REVIEW OF SYSTEMS:  Fourteen systems reviewed and apart from history of  presenting illness is noncontributory.   PHYSICAL EXAMINATION:  GENERAL:  [name] is a moderately built and  nourished male patient who currently is in no painful or respiratory  distress.  VITAL SIGNS:  Temperature is 97.7 degrees Fahrenheit, blood pressure is  141/83 mmHg, pulse 84 per minute, respirations 18 per minute, saturating  at 97% on room air.  HEENT:  Nontraumatic, normocephalic.  Pupils equal reacting to light and  accommodation.  Oral mucosa slightly dry but otherwise unremarkable.  NECK:  Supple.  No JVD or carotid bruit.  LYMPHATICS:  2-3 nontender less than 0.5 cm mobile anterior cervical  lymph nodes.  No other lymphadenopathy.  RESPIRATORY:  Good breath sounds bilaterally.  Few basilar crackles  bilaterally, right greater than the left.  No wheezing, no rhonchi.  Nontender chest wall.  CARDIOVASCULAR:  First and second heart sounds  heard.  No murmurs.  ABDOMEN:  Nondistended, nontender, soft.  No organomegaly or mass  appreciated.  Bowel sounds are normally heard.  CENTRAL NERVOUS SYSTEM:  The patient is somnolent but arousable easily.  No cranial nerves or focal neurological deficits.  EXTREMITIES:  With no clubbing, cyanosis or edema.  Peripheral pulses  are symmetrically felt.  SKIN:  Without any rashes.  MUSCULOSKELETAL:  Unremarkable.   LAB DATA:  PT and PTT normal.  Absolute reticulocyte count 242.6.  CBC  with hemoglobin 8.6, hematocrit 24.7, MCV 101, white blood cell 17.3,  platelets 655.  Point of care cardiac markers are negative.  Electrolytes unremarkable with BUN 4, creatinine 0.7.   CT angiogram of the chest.  Impression, study is not technically  adequate to evaluate for pulmonary embolus.  Scattered foci of right  lung air space disease in the right lower lobe and right middle lobe  could represent infection or infarction in a patient with sickle cell  anemia.  Lung findings are compatible with acute chest syndrome.  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  Cardiomegaly.  Of note, the radiologist did indicate that there were no  large central pulmonary emboli.  Chest x-ray.  Impression, worsening aeration to the right base which is  concerning for pneumonia.   ASSESSMENT AND PLAN:  1. Right middle lobe and right lower lobe pneumonia versus infarction      secondary to sickle cell disease.  We will admit the patient to      medical floor.  We will treat empirically for pneumonia with IV      ceftriaxone and Zithromax. Followup chest XRay to resolution.  2. Sickle cell crisis with ?acute chest syndrome (no fever, cough,      dyspnea).  Will provide hypotonic IV fluids, pain medications,      Motrin, oxygen.  Will continue hydroxyurea.  Will monitor.  3. 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  4. History of pulmonary embolism.  It is unclear as to the number of       times he has had it and the indication for long-term      anticoagulation.  In any event, the patient has been recurrently      noncompliant with his Coumadin.  CT angiogram of the chest on the      previous admission was negative for PE and on this visit we are      unable to confirm  if he has any peripheral PE.  In any event, the      patient is hemodynamically stable and not hypoxic with a low      clinical suspicion for PE at this time.  Will only place on DVT      prophylaxis and will discuss with his primary care doctor on Monday      regarding his reason for previous long-term anticoagulation.  5. Anemia which seems macrocytic in nature and is probably secondary      to sickle cell anemia but will check serum B12, folate and TSH and      monitor CBCs daily.  6. Chronic leukocytosis and thrombocytosis.  7. Tobacco abuse -for cessation counseling.  8. Substance abuse- for cessation counseling.      Marcellus Scott, MD  Electronically Signed     AH/MEDQ  D:  08/18/2007  T:  08/18/2007  Job:  [id number]   cc:   Lorelle Formosa, M.D.

## 2010-06-15 NOTE — Consult Note (Signed)
NAMECAPRICE, WASKO             ACCOUNT NO.:  [id number]   MEDICAL RECORD NO.:  [id number]          PATIENT TYPE:  EMS   LOCATION:  MAJO                         FACILITY:  [facility]   PHYSICIAN:  Jefry H. Pollyann Kennedy, MD     DATE OF BIRTH:  07-Jan-1981   DATE OF CONSULTATION:  09/28/2007  DATE OF DISCHARGE:  09/25/2007                                 CONSULTATION   REASON FOR CONSULTATION:  Infected cyst, right ear.   HISTORY:  This is a 30 year old with sickle cell who is in the hospital  for sickle cell crisis for the past 3 days.  He has a slowly enlarging  cystic-type mass just under the right ear lobe that has been present for  a little over a month.  It has never drained or gotten acutely infected.   Past medical and surgical history nicely outlined in the admission  history and physical.   He has no known drug allergies.   PHYSICAL EXAMINATION:  He is healthy-appearing young man in no distress.  There is a 1.5 cystic subcutaneous mass adjacent to the lobule of the  right ear.  On the contralateral side, there is a small skin pit  identified.  The ear canal was clear.  The remainder of the face, head  and neck is unremarkable.   IMPRESSION:  External ear cyst, possibly slightly infected.  Recommend  treatment with Keflex 500 mg q.i.d. for the next 10 days to see if we  can cool this down.  This will need to be excised surgically, which can  be performed as an outpatient under local anesthesia.  I have left my  contact number in case if he has any worsening problems before he gets  to see me.      Jefry H. Pollyann Kennedy, MD  Electronically Signed     JHR/MEDQ  D:  09/28/2007  T:  09/29/2007  Job:  [id number]

## 2010-06-15 NOTE — H&P (Signed)
NAMEALEXIE, [name]             ACCOUNT NO.:  [id number]   MEDICAL RECORD NO.:  [id number]          PATIENT TYPE:  INP   LOCATION:  4742                         FACILITY:  [facility]   PHYSICIAN:  Lucita Ferrara, MD         DATE OF BIRTH:  Sep 06, 1980   DATE OF ADMISSION:  01/08/2008  DATE OF DISCHARGE:                              HISTORY & PHYSICAL   PRIMARY CARE DOCTOR:  Dr. [name].   CHIEF COMPLAINT:  Sickle cell crisis again.   The patient is a 30 year old male with presentation of diffuse pain in  the right upper quadrant and chest accompanied by nausea.  The patient  also complains of exertional dyspnea.  He has a known history of sickle  cell anemia with similar presentations in the past.  He denies any  hematemesis, hematochezia, vomitus, nonbiliary.  The patient has had a  history of pulmonary embolism in the past.  He was on Coumadin.  Currently he is subtherapeutic, apparently he is not taking Coumadin.  He is on hydroxyurea.  He is status post splenectomy.   PAST MEDICAL HISTORY:  Significant for:  1. Sickle cell disease.  2. Numerous events of painful crisis.  3. History of pulmonary embolism.  4. History of ongoing tobacco abuse.   PAST SURGICAL HISTORY:  1. Status post right hip replacement.  2. Status post recent cyst removal from his right mandible region.   MEDICATIONS AT HOME INCLUDE:  Note medications have not been verified.  1. Hydroxyurea 500 mg p.o. daily.  2. Folic acid 2 mg p.o. daily.  3. Vicodin 7.5 mg p.o. q.6 h. p.r.n.   SOCIAL HISTORY:  The patient denies cigarettes, denies drugs or alcohol.   ALLERGIES:  No known drug allergies.   ROS otherwise negative.  EKG shows normal sinus rhythm with left  ventricular hypertrophy, normal ST-T waves.  Chest x-ray shows no  cardiopulmonary disease.   PHYSICAL EXAMINATION:  GENERAL:  The patient is in no acute distress.  He is a pleasant young [place] American male.  Blood pressure 125/72,  pulse 73,  respirations 16, temperature 98.1, pulse ox 98% on room air.  HEENT:  Normocephalic, atraumatic, sclerae are anicteric.  PERRLA.  Extraocular muscles intact.  NECK:  No JVD or carotid bruits.  CARDIOVASCULAR:  S1, S2, regular rate and rhythm, no murmurs, rubs or  clicks.  LUNGS:  Clear  to auscultation bilaterally,.  CARDIOVASCULAR:  No wheezes.  ABDOMEN:  Soft, nontender, nondistended.  Positive bowel sounds.  EXTREMITIES:  No clubbing, cyanosis or edema.   LABORATORY DATA:  Retic count 9.7, absolute retic 254.  White count  15.7, hemoglobin 9.3, hematocrit 27.1, MCV 100.4, platelets 532.  iStat  shows sodium 139, potassium 4.7.  Chest x-ray shows borderline  enlargement of pericardial silhouette, chronic underlying interstitial  changes, probably basilar atelectasis.   ASSESSMENT AND PLAN:  The patient is a 30 year old with known sickle  cell disease who presents with:  1. Presumed sickle cell crisis given reactive leukocytosis, high      reticulocyte count, hemoconcentration and dehydration.  2. History of pulmonary  embolism with active dyspnea.  3. Right upper quadrant abdominal pain with a history of      cholelithiasis.   DISCUSSION AND PLAN:  We will go ahead and admit the patient to the  telemetry unit.  We will initiate IV fluids, IV antibiotics with Avelox,  pain management with initiation of Dilaudid.  We may institute a pump.  Zofran IV.  Will proceed with right upper quadrant ultrasound and if  negative will proceed with a HIDA scan.  Will proceed with CT  angiography to rule out pulmonary embolism.  The rest of the plans will  depend on his progress.      Lucita Ferrara, MD  Electronically Signed     RR/MEDQ  D:  01/08/2008  T:  01/08/2008  Job:  [id number]

## 2010-06-15 NOTE — H&P (Signed)
[name], [name]             ACCOUNT NO.:  [id number]   MEDICAL RECORD NO.:  [id number]          PATIENT TYPE:  INP   LOCATION:  1415                         FACILITY:  [facility]   PHYSICIAN:  Wilson Singer, M.D.DATE OF BIRTH:  [birth year]   DATE OF ADMISSION:  12/14/2006  DATE OF DISCHARGE:                              HISTORY & PHYSICAL   HISTORY:  This is a 30 year old man who has sickle cell disease who  gives a 3 to 4-day history of bilateral chest pain which is sharp and  somewhat pleuritic in nature.  He denies any cough or shortness of  breath, or any hemoptysis.  It feels like the painful crisis pain that  he has had before.  He tells me that his hemoglobin is usually in the 9  range.   PAST SURGICAL HISTORY:  Right open reduction internal fixation for  avascular necrosis.   PAST MEDICAL HISTORY:  1. Sickle cell disease as mentioned above.  2. Hyposplenism.  3. Cholelithiasis.   SOCIAL HISTORY:  He is single and lives with a roommate.  He smokes 1  cigar a day.  He occasionally drinks alcohol.  He works as a Paediatric nurse.   MEDICATIONS:  1. Oxycodone 2 tablets per day.  2. Ibuprofen 800 mg as needed 3 times a day.   ALLERGIES:  Morphine.   FAMILY HISTORY:  Noncontributory.   REVIEW OF SYSTEMS:  Apart from the symptoms mentioned above, there are  no other symptoms referable to all systems reviewed except that he does  have tattoos on his body.   PHYSICAL EXAMINATION:  VITAL SIGNS:  Temperature 97.9, blood pressure  123/69, pulse 83, respiratory rate is 12 to 14, saturation 100%.  CARDIOVASCULAR:  Heart sounds are present and normal.  RESPIRATORY:  Lung fields are clear.  There is pleural rub.  ABDOMEN:  Soft, nontender, with no hepatosplenomegaly.  NEUROLOGICAL:  He is alert and orientated with no focal neurologic  signs.   INVESTIGATIONS:  Sodium 140, potassium 3.1, bicarbonate 23.  Glucose  106, BUN 8, creatinine 0.58.  Reticulocyte count 9.4%, clearly  elevated.  Absolute reticulocyte count also elevated at 227.5.  Hemoglobin 7.8,  white blood cell count 22.7, platelets 466.   IMPRESSION:  1. Sickle cell crisis.  2. Rule out pulmonary embolism as a source of his chest pain.   PLAN:  1. Admit.  2. Intravenous fluids, oxygen, analgesics.  3. Check D-dimer and if positive, will undergo a CT angio chest scan.  4. Further recommendations will depend on patient's hospital progress.      Wilson Singer, M.D.  Electronically Signed     NCG/MEDQ  D:  12/14/2006  T:  12/15/2006  Job:  [id number]   cc:   Thayer Headings, M.D.  Fax: [phone]
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
## 2010-06-15 NOTE — H&P (Signed)
NAMEERRICK, SALTS             ACCOUNT NO.:  [id number]   MEDICAL RECORD NO.:  [id number]          PATIENT TYPE:  INP   LOCATION:  1823                         FACILITY:  [facility]   PHYSICIAN:  Mobolaji B. Bakare, M.D.DATE OF BIRTH:  11-20-1980   DATE OF ADMISSION:  05/10/2007  DATE OF DISCHARGE:                              HISTORY & PHYSICAL   PRIMARY CARE PHYSICIAN:  Lorelle Formosa, M.D.   CHIEF COMPLAINT:  Body pains.   HISTORY OF PRESENT ILLNESS:  Mr. Deeg is a 30 year old African  American male with history of sickle cell disease.  He started  experiencing bone pain involving his chest, right arm, both extremities  a few days ago.  The p.r.n. pain medication he uses at home is not  helping.  Hence, he came to the emergency room for further management.  He has received IV Dilaudid in the emergency room despite his pain is  still uncontrolled.  He appears to have received a total of 6 mg.  The  patient will be admitted for further treatment.   REVIEW OF SYSTEMS:  He denies any shortness of breath, headache, fever,  chills, dizziness, nausea, vomiting or diarrhea.  He noticed a sty on  his left upper eyelids a few days ago.  It is painful.  There is no  changes in his vision.   PAST MEDICAL HISTORY:  1. Sickle cell crisis with multiple hospitalizations  2. Chronic leukocytosis.  3. Thrombocytosis.  4. Pulmonary embolism on chronic Coumadin.  5. History of hyposplenism.  6. Cholelithiasis.   PAST SURGICAL HISTORY:  Right leg surgery.   CURRENT MEDICATIONS:  1. Vicodin p.r.n.  2. Oxycodone.  3. APAP p.r.n.  4. Ibuprofen p.r.n.  5. Coumadin one tablet daily.  The patient could not tell dosage.  6. Hydroxyurea, dose unknown.   ALLERGIES:  MORPHINE.   FAMILY HISTORY:  Noncontributory.  Mother is alive and well.  Father  passed away from a gun shot accident.   SOCIAL HISTORY:  The patient denies tobacco abuse.  He admits to  drinking over the weekends beer,  liquor, half of a fifth.   PHYSICAL EXAMINATION:  VITAL SIGNS:  Initially, temperature 98.4, blood  pressure 155/90, pulse 76, respirations 20, O2 saturation 100%.  GENERAL:  The patient is uncomfortable, itching, no acute respiratory  distress.  HEENT:  Normocephalic, atraumatic.  Pupils are equal and reactive to  light.  He has a left upper eyelid scar.  There is no __________ to  suggest abscess.  LUNGS:  Clear clinically to auscultation.  CV:  S1, S2 regular.  No murmur or gallop.  ABDOMEN:  Not distended.  Soft, nontender.  Bowel sounds present.  EXTREMITIES:  No pedal edema or calf tenderness.  MUSCULOSKELETAL:  Tenderness in the lower extremities.  EXTREMITIES:  No evidence of cellulitis.   LABORATORY DATA:  Reticulocyte count 241.  Sodium 135, potassium 3.2,  chloride 102, CO2 25, glucose 105,  BUN 8, creatinine 0.65, calcium 8.4,  D. dimer 0.59, PT/INR 19.9/1.9.  White cell 18, hemoglobin 8, hematocrit  at 23.4.  MCV 102, platelets 575.  EKG normal sinus rhythm with sinus arrhythmia at rate of 72.  Chest x-  ray showed no acute abnormality.  CT angiogram of the chest was negative  for pulmonary embolism, bilateral lower lobe atelectasis was stable.  Moderate cardiomegaly.   ASSESSMENT/PLAN:  Mr. Doverspike is a 30 year old African American male  presenting with sickle cell crisis.  He will be admitted for pain  management.   ADMISSION DIAGNOSES:  1. Sickle cell crisis.  2. Sickle cell anemia.  3. Stye left upper lid.  4. History of pulmonary embolism, subtherapeutic INR.  5. Hypokalemia, will replete.  6. Alcohol use.   PLAN:  1. Will start Dilaudid PCA at a reduced dose for pain control, give      Dilaudid p.r.n. for itching.  Please see sickle cell admission      orders for more details.  2. Will start Unasyn 3 g IV q.6 h for 48 hours then change to p.o.      Augmentin 875 p.o. b.i.d.  3. Pharmacy to dose Coumadin.  4. Ativan 1-2 mg IV q.4 h p.r.n. for  agitation.  5. Potassium chloride 40 mEq times one hour, and will supplement an IV      fluid.      Mobolaji B. Corky Downs, M.D.  Electronically Signed     MBB/MEDQ  D:  05/10/2007  T:  05/10/2007  Job:  [id number]   cc:   Lorelle Formosa, M.D.

## 2010-06-15 NOTE — H&P (Signed)
NAMESELMER, ADDUCI             ACCOUNT NO.:  [id number]   MEDICAL RECORD NO.:  [id number]          PATIENT TYPE:  EMS   LOCATION:  MAJO                         FACILITY:  [facility]   PHYSICIAN:  Michaelyn Barter, M.D. DATE OF BIRTH:  April 10, 1980   DATE OF ADMISSION:  03/08/2007  DATE OF DISCHARGE:                              HISTORY & PHYSICAL   PRIMARY CARE PHYSICIAN:  Lorelle Formosa, MD   CHIEF COMPLAINT:  Right flank pain.   HISTORY OF PRESENT ILLNESS:  Mr. Dresch is a 30 year old gentleman with  a past medical history of sickle cell disease who states that his  morning shortly after waking up he developed right flank pain.  The pain  is described as sharp and constant.  He also complains of a cough, but  denies any production.  There have been no fevers or chills, no  shortness of breath, no nausea or vomiting.  He states that deep  inspiration aggravates the pain.  He attributes the pain to his sickle  cell disease.  He indicates that he took some oxycodone today.  However,  the pain relief was minimal and only short term.   PAST MEDICAL HISTORY:  1. Sickle Cell Disease  2. Pneumonia.  3. Pulmonary embolism.  4. Sickle cell crisis.  5. Left leg pain.  6. Hyposplenism.  7. Cholelithiasis.  8. Anemia.  9. Leukocytosis.  10.Thrombocytosis.   PAST SURGICAL HISTORY:  Right leg surgery.   CURRENT HOME MEDICATIONS:  1. Oxycodone.  2. Ibuprofen 800 mg.   ALLERGIES:  MORPHINE.   SOCIAL HISTORY:  Cigarettes, the patient denies.  Alcohol occasionally.   FAMILY HISTORY:  The patient does not provide.   REVIEW OF SYSTEMS:  As per HPI, otherwise all other systems are  negative.   PHYSICAL EXAMINATION:  GENERAL:  The patient is awake, he is  cooperative.  VITAL SIGNS:  His temperature is 100.0.  Blood pressure 126/70.  Heart  rate 98.  Respirations 20.  O2 sat 98%.  HEENT:  Normocephalic, atraumatic.  Positive scleral icterus.  Oral  mucosa is pink.  No thrush, no  exudates.  NECK:  No JVD, no lymphadenopathy, no thyromegaly.  CARDIAC:  S1 and S2 present.  Regular rate and rhythm.  RESPIRATORY:  No crackles or wheezes.  ABDOMEN:  Flat, soft, nontender, nondistended, positive bowel sounds.  EXTREMITIES:  No leg edema.  NEUROLOGIC:  The patient is alert and oriented x3.  Cranial nerves 2-12  are intact.  MUSCULOSKELETAL:  Approximately 4/5 right leg strain, 5/5 left leg  strain, 5/5 bilateral arm strain.   LABS:  The pH is 7.372, pCO2 is 42.0, bicarbonate 24.4.  Hemoglobin 9.5,  hematocrit 28.0, sodium 138, potassium 3.3, chloride 103, glucose 97,  BUN 9, creatinine is 0.7.  White blood cell count is 21.  Chest x-ray  reveals new right lower lobe infiltrate.   ASSESSMENT AND PLAN:  1. Right lower lobe pneumonia.  Will check blood cultures x2, as well      as sputum cultures, and start empiric intravenous antibiotics.  2. Sickle cell pain crisis.  This may have  been triggered by the      presence of the pneumonia.  Will provide the patient with pain      medication on a p.r.n. basis.  3. Anemia.  This may be secondary to the patient's history of sickle      cell disease.  Will monitor the patient's hemoglobin and      hematocrit.  4. Hypokalemia.  Will provide supplementation.  5. Gastrointestinal prophylaxis.  Will provide Protonix.      Michaelyn Barter, M.D.  Electronically Signed     OR/MEDQ  D:  03/08/2007  T:  03/08/2007  Job:  [id number]   cc:   Lorelle Formosa, M.D.
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
## 2010-06-18 NOTE — Discharge Summary (Signed)
[name], [name]             ACCOUNT NO.:  [id number]   MEDICAL RECORD NO.:  [id number]          PATIENT TYPE:  INP   LOCATION:  5005                         FACILITY:  [facility]   PHYSICIAN:  Leighton Roach McDiarmid, M.D.DATE OF BIRTH:  January 09, 1981   DATE OF ADMISSION:  01/18/2008  DATE OF DISCHARGE:  01/23/2008                               DISCHARGE SUMMARY   ATTENDING PHYSICIAN:  Leighton Roach McDiarmid, MD   PRIMARY CARE [name]:  Lorelle Formosa, MD   DISCHARGE DIAGNOSES:  1. Sickle cell crisis.  2. Chronic leukocytosis.  3. Chronic thrombocytosis.  4. Normal cardiac function, ejection fraction 60%.   DISCHARGE MEDICATIONS:  1. Hydromorphone 2-4 mg p.o. q.4 h. p.r.n. pain.  2. Hydroxyurea 500 mg p.o. daily.  3. Folic acid 1 mg p.o. daily.  4. Ambien 10 mg p.o. nightly p.r.n. insomnia.  5. Motrin 800 mg p.o. q.8 h. p.r.n. pain.   CONSULTANT:  None.   DISCONTINUE MEDICATIONS:  Vicodin.   PROCEDURES:  Echocardiogram ejection fraction 60%, mild to moderate left  ventricular hypertrophy.   LABORATORY DATA:  Blood cultures, no growth to date x2.  Cardiac enzymes  negative x2.  BMET, sodium 137, potassium 3.7, BUN 6, and creatinine  0.65. CBC, hemoglobin 8.4 and hematocrit 25.1, hemoglobin on admission  7.6 and hematocrit 23.3.   IMAGING:  1. Chest x-ray on January 18, 2008.  Impression:  Lingular and right      basilar scarring or subsegmental atelectasis along with mild      cardiomegaly and chronic cephalization of blood flow, likely      related to sickle cell disease.  No overt new opacity or pleural      effusion identified.  2. Chest x-ray on January 21, 2008.  Impression:      a.     Cardiomegaly with interval increase in heart size.      b.     Chronic scarring in the lungs.      c.     No congestive heart failure or active disease.   BRIEF HOSPITAL COURSE:  A 30 year old male with history of sickle cell  anemia, SS  disease, recently discharged from [facility] on  January 15, 2008, status post sickle cell crisis and laparoscopic  cholecystectomy.  Hemoglobin at previous discharge was 7.8, status post  transfusion of 1 unit.  The patient left hospital early due to family  emergency, was readmitted with sickle cell crisis on January 18, 2008.  1. Sickle cell crisis:  The patient is admitted with sickle cell      crisis, pain mostly located in epigastric area and bilateral      flanks.  Pain was initially controlled with Dilaudid 2 mg IV q.2 h.      and on hospital day #1 was spaced to q.4 h.  The patient was      switched to p.o. medication on hospital day #2.  Upon reevaluation,      the patient continued to have pain while on p.o. medications and      pain crisis had resolved in the epigastric  area, however, continued      in left flank and moved to left side of chest and left shoulder.      Pain was not being managed well with p.o. medications, therefore      decision was made by primary care team and the patient to restart      IV Dilaudid in form of PCA.  Of note, since pain had moved to left      side of chest and the patient had a history of recurrent      pneumonia/acute chest syndrome, repeat chest x-ray was done on      hospital day #3.  Chest x-ray did not show any evidence of active      disease and the patient remained afebrile, although white count was      initially elevated secondary to reactive leukocytosis which is      chronic for this patient.  There was no evidence of acute chest      during admission and the patient did not receive any IV      antibiotics. The patient had Dilaudid PCA for approximately 36      hours and was then transferred to equivalent p.o. Dilaudid dose      prior to discharge.  As on day of discharge, the patient expressed      that his pain was well controlled and he was ready to be      discharged.  Prior to discharge, pain score was 1-2/10.  2. Abnormal EKG.  On admission, the patient had a  routine EKG due to      pain in the left side of flank.  EKG showed Q-wave in lead 3 and      cardiac enzymes were negative x2.  With continued pain in the left      side of flank and left chest, repeat EKG showed T-wave inversions      in the lateral leads.  Therefore, echocardiogram along with      clinical examination which revealed a murmur was done.      Echocardiogram showed mild to moderate left ventricular hypertrophy      and ejection fraction was 60%.  There were no valvular      abnormalities.  No further intervention was needed.  3. Sickle cell anemia.  Baseline hemoglobin was 8-9.  After review of      recent discharge from Internal Medicine team, it was found the      patient was to be transfused one more unit prior to discharge.      However, the patient left prior to receiving second unit.  The      patient was transfused 1 unit upon admission to a baseline      hemoglobin of 8.4.  No further transfusions were needed during      admission.  The patient did not have any signs of transfusion      reaction.  4. Chronic leukocytosis.  The patient with history of chronic      leukocytosis which may be reactive as there was no evidence of      infection.  The patient was watched closely for any signs of acute      chest.  During admission, no antibiotics were necessary.  5. Chronic thrombocytosis, unchanged.  6. Status post lap cholecystectomy incision, remained dry, clean, and      intact.  There was no signs of infection.   DISPOSITION:  Of note, Pain  regimen which appears to work for this  patient is : Reduced dose of PCA Dilaudid with Motrin 800 mg q.8 h.  Transition to p.o. Dilaudid 2 mg p.o. q.4 h.   ISSUES FOR FOLLOWUP:  Final blood cultures   FOLLOWUP APPOINTMENTS:  The patient is to schedule followup appointment  with primary care Kaliann Coryell, Dr. [name] in the next 1-2 weeks.   DISCHARGE CONDITION:  Stable/improved.      Milinda Antis, MD   Electronically Signed      Leighton Roach McDiarmid, M.D.     KD/MEDQ  D:  01/26/2008  T:  01/27/2008  Job:  [id number]   cc:   Lorelle Formosa, M.D.

## 2010-06-18 NOTE — Discharge Summary (Signed)
NAMENETANEL, YANNUZZI             ACCOUNT NO.:  [id number]   MEDICAL RECORD NO.:  [id number]          PATIENT TYPE:  OBV   LOCATION:  1302                         [facility]ACILITY[facility]  [facility]   PHYSICIAN:  Lorelle Formosa, M.D.DATE OF BIRTH:  September 13, 1980   DATE OF ADMISSION:  08/18/2004  DATE OF DISCHARGE:  08/20/2004                                 DISCHARGE SUMMARY   ADMISSION DIAGNOSIS:  SS sickle cell vaso-occlusive crisis.   DISCHARGE DIAGNOSIS:  SS sickle cell vaso-occlusive crisis.   DISCHARGE CONDITION:  Stable.   DISPOSITION:  Follow up in the office in 1-2 weeks.   DISCHARGE MEDICINES:  1.  MS Contin 50 mg b.i.d.  2.  MSIR 50 mg daily p.r.n. breakthrough pain.  3.  Ibuprofen 800 mg t.i.d. p.r.n.   HISTORY:  This patient is a 30 year old single black male who has known SS  Sickle Cell disease.  He presented to the emergency room at [facility] for  evaluation of pain typical of Sickle Cell crisis.  He was referred for  inpatient care after not getting relief enough with IV fluids, Dilaudid IV,  and oxygen to return home.  The patient is currently employed and lives with  some family in the city.  He has been treated for pneumonia in the past and  has had blood transfusions.  The last consultation was several months ago.  The patient has never been treated with Hydrea and has no history of blood  clots.   PAST MEDICAL HISTORY:  The patient has been hospitalized for SS related  illness.   OPERATIONS:  1.  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  2.  He had ear surgery at approximately [age] years of age.   HABITS:  NO KNOWN ALLERGIES.   MEDICINES:  Are as listed in history of present illness.   FAMILY HISTORY:  Reveals the patient's mother is alive and well.  Dad  deceased from a gunshot wound.  He has three siblings, several with AS  sickle cell genotype.  He has no children.   REVIEW OF SYSTEMS:  Reveals the patient lately has had headaches  but  basically just aching in his head.  He has not had any further or specific  evaluation or special treatments for this.  His respirations have been  normal.  He has no history of asthma.  Review of systems is otherwise  unremarkable.   SOCIAL HISTORY:  Reveals the patient smokes marijuana presumably when he  runs out of pain medicines to ease his pain.  He does not use any other  elicit drugs.  He does socially drink alcohol.   PHYSICAL EXAMINATION:  VITAL SIGNS:  The patient's blood pressure was  124/69, pulse was 65, respirations were 20, temperature was 97.6 upon  admission.  GENERAL:  The patient was alert when aroused but was groggy from medicines  when left alone.  HEENT:  Revealed PERRLA.  There was slight scleral icterus.  CHEST:  Clear to auscultation.  HEART:  Regular rhythm and rate without murmur.  ABDOMEN:  Soft and flat.  EXTREMITIES:  Normal.  He was ambulatory without difficulty.  NEUROLOGIC:  Grossly appropriate.   HOSPITAL COURSE:  The patient was transported by coming to [facility] to the oncology unit.  Here he was continued on IV fluids for the  eight hour stay.  The fluids were D5 half normal saline at 100 cc/hour.  He  was given Dilaudid 2 mg with promethazine a total of 5 mg q.2h.  He was  given Benadryl 50 mg q.6h. for itching.  He was continued on nasal cannula  O2 and he was given Protonix 4 mg and K-Dur because of mild hypokalemia.  The patient had some difficulty with initiating urination and was given  Urecholine with resolution.  His urinalysis was normal.  A urine drug screen  was negative.  The patient's reticulocyte count was 8.6% and type and screen  revealed O positive blood.  A CBC revealed a white count of 15.1 with a  hemoglobin of 8.3 and a hematocrit of 24.9.  Platelets were 518,000.  Chemistries were normal except for a potassium of 3.4.  Total bilirubin of  3.7.  Glucose was 108.  The patient is at this time stable enough  for  discharge home.  He was held over past an eight hour stay due to persisting  pain and is now feeling much, much better.       WWM/MEDQ  D:  08/20/2004  T:  08/20/2004  Job:  [id number]

## 2010-06-18 NOTE — Discharge Summary (Signed)
[name], [name]             ACCOUNT NO.:  [id number]   MEDICAL RECORD NO.:  [id number]          PATIENT TYPE:  INP   LOCATION:  3016                         FACILITY:  [facility]   PHYSICIAN:  Marcellus Scott, MD     DATE OF BIRTH:  Mar 19, 1980   DATE OF ADMISSION:  05/10/2007  DATE OF DISCHARGE:  05/10/2007                               DISCHARGE SUMMARY   PRIMARY MEDICAL DOCTOR:  Lorelle Formosa, M.D.   This patient left the hospital against medical advice.   DISCHARGE DIAGNOSES:  1. Sickle cell vaso-occlusive crisis.  2. Hypokalemia.  3. Anemia, secondary to sickle cell disease.  4. History of pulmonary embolism on chronic anticoagulation.  No new      pulmonary embolism on computerized tomography.  5. History of alcohol use.  6. Left upper eyelid stye.  7. Leukocytosis and thrombocytosis, most likely secondary to sickle      cell crisis   PROCEDURES:  1. A CT angiogram of the chest, pulmonary embolism protocol.  Impression:  A.  No evidence of pulmonary embolism.  B.  Bilateral lower lobe atelectasis.  C.  Stable moderate cardiomegaly.  1. Chest x-ray.  Impression:  Stable chest.   PERTINENT LABORATORIES:  Comprehensive metabolic panel remarkable for  potassium 3.4, BUN 7, creatinine 0.76, bilirubin of 3.1, CBCs with  hemoglobin 8.1, hematocrit 24.4, white blood cell 19.7, platelets 576,  absolute reticulocyte count of 240.6, D-dimer elevated at 0.59, and INR  of 1.6.   PHONE CONSULTATION:  [name] L. [name], M.D. of sickle cell disease.   HOSPITAL COURSE:  [name] is a 30 year old African-American male  patient with history of sickle cell disease, pulmonary embolism on  chronic anticoagulation, and chronic leukocytosis and thrombocytosis who  presented with pain in his chest and his extremities.  He was evaluated  in the emergency room and assessed to have vaso-occlusive/painful crisis  and admitted for further evaluation and management.  He was admitted to  medical floor.  He was placed on hypotonic IV fluids and started on  Dilaudid high dose PCA.  He was also supplemented with folic acid,  multivitamins, and hydroxyurea.  He was provided Benadryl for itching  and Zofran as an antiemetic.  Despite this, the patient continued to be  in pain when he received aliquots of Dilaudid.  However, in the interim,  the patient became agitated, angry, using foul language, and cursing out  at the nursing staff and the physician, demanding pain medications.  He  indicated that he took 1-2 mg of Dilaudid every 8 minutes during his  crisis.  In any event, I discussed his case in detail with Dr. Willey Blade  who suggested adding a long-acting MS Contin and making adjustments to  the Dilaudid PCA, which were done.  The patient's potassium was also  being supplemented.  However, the patient, at this time, indicated that  he wanted to sign out against medical advice.  He had been counseled  even before when he threatened to leave AMA.  He had been advised that  it was best for him to undergo inpatient care to  control his overall  medical conditions.  The risks of his leaving against medical advise  were multiple which may include further worsening of his pain or dyspnea  and deterioration and even death. He stated understanding but still was  angry & wanted to leave against medical advice.  Following this, patient  signed himself out against medical advice.   Because of elevated D-dimer and subtherapeutic INR and previous history  of pulmonary embolism, CT of the chest was done and pulmonary embolism  was ruled out.      Marcellus Scott, MD  Electronically Signed     AH/MEDQ  D:  05/12/2007  T:  05/13/2007  Job:  [id number]   cc:   Lorelle Formosa, M.D.
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
## 2010-06-18 NOTE — H&P (Signed)
[name]. [name]  Patient:    Gerald, [name]                    MRN: [id number] Adm. Date:  [id number] Attending:  Tobin Chad Dictator:   Gwenlyn Perking, M.D.                         History and Physical  PROBLEM LIST: 1. Sickle cell pain crisis. 2. History of sickle cell SS disease with baseline hemoglobin of 9-10.5.  CHIEF COMPLAINT:  Side pain.  HISTORY OF PRESENT ILLNESS:  Patient is a 30 year old African-American male with a history of sickle cell disease (SS disease) with a three-hour history of bilateral side pain, which he tells me is usual of his typical sickle crisis pain.  Pain is continuous and sharp.  He tried [employer] with no relief.  The patient tells me that he has actually had this problem with bilateral side pain for about a week now.  He has taken [facility] Powders for that during this past week, and it seemed to have helped well thus far.  Patient denies any history of fevers or chills.  No upper respiratory symptoms.  No dysuria.  Patient denies any recent history of upper respiratory infection or urinary tract infections.  REVIEW OF SYSTEMS:  CONSTITUTIONAL:  No weight loss.  No night sweats. CARDIOVASCULAR:  No chest pain.  RESPIRATORY:  Positive shortness of breath tonight.  No cough.  GASTROINTESTINAL:  No diarrhea.  Patient has a bowel movement per day.  No hematochezia.  SKIN:  No rashes.  NEUROLOGIC:  No deficits.  No history of stroke.  EYES:  Patient has a history of scleral icterus, "my eyes stay yellow;" however, during pain crisis they usually get more intensely _____.  ENDOCRINOLOGIC:  No history of diabetes mellitus. ENT:  No problems.  GENITOURINARY:  No problems urinating.  HEMATOLOGIC:  The patient has had one blood transfusion in the past.  History of SS disease as above.  PAST MEDICAL HISTORY:  Sickle cell anemia.  Patient has only been hospitalized x 2 in the past three years for pain  crises.  SOCIAL HISTORY:  No tobacco, no alcohol, no drugs.  The patient lives with his mother.  He finished school.  He will be employed at [employer] in a few days.  ALLERGIES:  No known drug allergies.  FAMILY HISTORY:  Father was shot, is deceased.  Mother has diabetes mellitus. Aunt, unknown cancer.  Maternal grandfather, MI, age unknown.  PHYSICAL EXAMINATION:  VITAL SIGNS:  Temperature 98.4, heart rate 88, blood pressure 149/81, respiratory rate 26.  GENERAL:  Well-developed, well-nourished 30 year old African-American male, alert and oriented, in moderate distress secondary to side pain.  HEENT:  Normocephalic, atraumatic.  Pupils equally round and reactive to light and accommodation.  Extraocular muscles intact.  The conjunctivae appear icteric bilaterally.  The lids are without lesions. Tympanic membrane on the right is obstructed with cerumen; the left appears gray and shiny.  The nose and throat are both within normal limits.  The secretions are somewhat thick; however, lips are moist.  NECK:  There is bilateral submental lymphadenopathy, tender to palpation.  No lymphadenopathy otherwise, either anterior or posteriorly.  CHEST:  Clear to auscultation bilaterally.  No egophony.  No whispered pectoriloquy.  CARDIAC:  Regular rate and rhythm with a 2/6 systolic murmur best appreciated over Erbs point at the left lower sternal border.  ABDOMEN:  Soft, nontender, nondistended, with normoactive bowel sounds.  No hepatosplenomegaly.  EXTREMITIES:  No clubbing, cyanosis, or edema.  Capillary refill less than one second.  NEUROLOGIC:  Cranial nerves II-XII grossly intact.  No focal sensory or motor deficit.  No ataxia.  LABORATORY DATA:  Sodium 138, potassium 3.2, BUN 6, creatinine 0.7, glucose 93.  HDLT 27 and 16.  Bilirubin is 7.1, fractionated bili is pending.  H&H 8.5 and 24.8, white blood cell count is 17.4, 72% neutrophils.  There are 592 platelets.   Positive sickle cells on the smear.  Chest x-ray is pending. Blood cultures x 2 pending.  ASSESSMENT:  A 30 year old African-American male with a history of sickle cell disease with a one-week history of bilateral side pain, which is usual for the patients sickle crisis pain.  There are no clear precipitating infectious factors identified (no upper respiratory infection, no urinary tract infection symptoms).  The only thing that can be elicited by history is that the patient has been exerting himself a little bit more playing basketball nightly.  PLAN: 1. Admit the patient to the family practice teaching service. 2. Analgesia with scheduled ibuprofen and morphine as well as PCA morphine    pump for breakthrough pain. 3. Will investigate potential occult infectious sources with chest x-ray and    blood culture. 4. Will hold off on empiric antibiotics unless patient becomes febrile. 5. Will supplement folate. 6. Will restart hydroxyurea at 1000 mg p.o. q.d.  The patient ran out of this    approximately one year ago and has not been refilled. DD:  05/28/00 TD:  05/29/00 Job: [id number]

## 2010-06-18 NOTE — Discharge Summary (Signed)
[place]. [facility]  Patient:    [name], [name]                    MRN: [id number] Adm. Date:  [id number] Disc. Date: [id number] Attending:  Tobin Chad Dictator:   Ocie Doyne, M.D. CC:         Jacobo Forest, M.D.   Discharge Summary  DISCHARGE DIAGNOSES: 1. Sickle cell pain crisis. 2. History of sickle cell SS disease with a baseline hemoglobin of 9-10.5. 3. Leukocytosis.  DISCHARGE MEDICATIONS: 1. Hydroxyurea 1000 mg p.o. q.d. 2. Folate 1 mg p.o. q.d. 3. Ibuprofen 600 mg p.o. q.6h. p.r.n. pain. 4. Percocet one to two q.6h. p.r.n. pain, #10 given.  HISTORY OF PRESENT ILLNESS:  This 30 year old African-American male with a history of sickle cell SS disease presented with a three-hour history of bilateral side pain, which is typical of his sickle cell crisis pain.  He described the pain as continuous and sharp.  He tried [facility] Powders with no relief.  He had had this problem with bilateral side pain for approximately one week, but it has become acutely worse.  He denied a history of fever or chills.  No upper respiratory symptoms, dysuria, recent history of illness, or urinary tract infections.  PHYSICAL EXAMINATION:  He was afebrile.  Vital signs were stable.  Mildly tachypneic with a respiratory rate of 26.  HEENT:  Pertinent findings on physical exam include icteric conjunctivae.  LYMPHATICS:  Bilateral lymphadenopathy in a submental distribution.  LUNGS:  Clear to auscultation.  ABDOMEN:  Soft, nontender, and nondistended.  ADMISSION LABORATORY DATA:  Sodium 138, potassium 3.2, BUN 6, creatinine 0.7, glucose 93, AST 27, ALT 16, bilirubin 7.1.  White blood cell count 17.4, 72% neutrophils, hemoglobin 8.5, hematocrit 24.8, platelets 592.  Positive sickle cells on the smear.  Urinalysis negative.  Reticulocyte count 7.9%, absolute reticulocyte count 210.1.  The chest x-ray revealed cardiomegaly and prominence of vascular  structures consistent with sickle cell anemia and no infiltrate, consolidation, or congestive heart failure.  Blood cultures x 2 were drawn.  HOSPITAL COURSE: #1 - SICKLE CELL CRISIS:  The patient was admitted for analgesia.  He was placed on a morphine PCA, as well as scheduled ibuprofen and morphine.  He received hydroxyurea and folate.  The patient had previously been on hydroxyurea, but ran out one year ago and had no refilled his medications.  He remained afebrile during this hospitalization, so empiric antibiotics were not started.  Blood cultures at 48 hours were negative.  The evening prior to discharge, the patient reported that he felt well and desired to go home.  He was persuaded to stay overnight for a trial off morphine PCA and received only ibuprofen overnight for analgesia.  He tolerated this well.  The following morning, he denied any pain and stated that he felt better.  At discharge, the white count was 13.8, hemoglobin 7.8, and LDH 230.  On exam, the patient was denying any pain and did not appear to be in any distress.  #2 - URINARY TRACT INFECTION AND SEXUALLY TRANSMITTED DISEASES:  On reviewing the patients old records, a visit to the emergency department from April 09, 2000, was noted where a urinary tract infection was diagnosed with positive leukocytes and wbcs too numerous to count, as well as a culture positive for chlamydia.  I reviewed this information with the patient to ensure that he had received adequate treatment and understood the necessity of informing all  sexual contacts of the need for treatment.  The patient agreed to comply with this.  His to to follow up at [facility] with Jacobo Forest, M.D. DD:  05/30/00 TD:  05/30/00 Job: [id number]
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
## 2010-06-18 NOTE — Discharge Summary (Signed)
[name], [name]             ACCOUNT NO.:  [id number]   MEDICAL RECORD NO.:  [id number]          PATIENT TYPE:  OBV   LOCATION:  1331                         FACILITY:  [facility]   PHYSICIAN:  [name], M.D.   DATE OF BIRTH:  11-02-1980   DATE OF ADMISSION:  11/26/2004  DATE OF DISCHARGE:  11/27/2004                                 DISCHARGE SUMMARY   PRIMARY CARE PHYSICIAN:  Lorelle Formosa, M.D.   DISCHARGE DIAGNOSES:  1.  Sickle cell disease with vaso-occlusive crisis.  2.  Left upper quadrant abdominal pain secondary to #1.  3.  Asymptomatic cholelithiasis.  4.  History of right total hip replacement.  5.  History of hospitalizations for pneumonia.  6.  Left foot ulcer secondary to recent surgery with surgical wound      complicated by dehiscence, followed by [facility].   DISCHARGE MEDICATIONS:  1.  Hydroxyurea 500 mg b.i.d.  2.  Folic acid 1 mg daily.  3.  Dilaudid 2 mg q.4h. p.r.n. for 2 days, then Vicodin 5/500 q.4-6h. p.r.n.      pain.  4.  Keflex as before.   CONSULTATIONS:  None.   PROCEDURES AND DIAGNOSTIC STUDIES:  1.  Chest x-ray on November 26, 2004 showed mild bronchitic changes but no      focal infiltrates.  2.  CT scan of the abdomen and pelvis showed multiple gallstones. There was      some hepatomegaly. There were no splenic infarcts.   DISCHARGE LABORATORY DATA:  Vitamin B12 was normal at 397, amylase was 48,  reticulocyte count was appropriately elevated at 6.7 with an absolute  reticulocyte count of 201.7. Lipase was 23. Sodium 142, potassium 3.1  (repleted), chloride 107, bicarb 28, BUN 6, creatinine 0.6, glucose 103.  LFTs were all within normal limits. Hemoglobin was 9.9, hematocrit 29.9, MCV  104.8, white blood cell count 12.2, and platelets 566.   BRIEF ADMISSION HISTORY OF PRESENT ILLNESS:  The patient is a 30 year old  male with a history of sickle cell disease and vaso-occlusive pain crisis  who presented to the emergency  department on November 26, 2004 with left  sided chest wall and left upper quadrant abdominal pain that was progressive  over the previous week. He also complained of some mild right sided pain. No  associated nausea, vomiting, fever, chills, or diarrhea. He was admitted for  IV pain medications for his sickle cell crisis.   HOSPITAL COURSE:  1.  Left upper quadrant abdominal and chest wall pain secondary to sickle-      cell disease, vaso-occlusive crisis. The patient was admitted and      administered IV fluids and PCA morphine. He was also put on MS Contin      for better pain control. He reported that his pain was decreased to a      level 3 and was stable for discharge the following day. A CT scan was      obtained of the abdomen to rule out splenic infarct or other process      explaining his left upper quadrant abdominal  pain. He did have evidence      of gallstones on CT scan but no inflammation or elevation in his LFTs to      suggest active cholecystitis. He was deemed stable for discharge on the      above noted medications on hospital day #2. The patient felt he could      manage with p.o. pain medications at this point.  2.  Left foot ulcer. The patient has recent surgery on his left foot and had      a postoperative infection with surgical dehiscence of the wound. He has      been taking Keflex and is being followed by Dr. Charlsie Merles at the [facility]. He instructed to follow up with Dr. Charlsie Merles as previously      scheduled.  3.  Macrocytosis. The patient's B12 and RBC folate was checked. His B12 was      within normal limits. The RBC folate is pending at the time of this      dictation.  4.  Anemia. Secondary to #1. He is appropriately reticking. No indication      for blood transfusion this hospitalization.   DISPOSITION:  The patient will be discharged home. He has no restrictions to  his diet or activity. He should return to see Dr. [name], his primary care   physician, in one week's time. He has been instructed to call his primary  care physician for any worsening pain or changes in his foot.   CONDITION:  Improved.           ______________________________  [name], M.D.     CR/MEDQ  D:  11/27/2004  T:  11/27/2004  Job:  [id number]   cc:   Lorelle Formosa, M.D.  Fax: [phone]

## 2010-06-18 NOTE — Discharge Summary (Signed)
   [name], [name]                       ACCOUNT NO.:  [id number]   MEDICAL RECORD NO.:  [id number]                   PATIENT TYPE:  INP   LOCATION:  5037                                 FACILITY:  [facility]   PHYSICIAN:  Rodolph Bong, M.D.                  DATE OF BIRTH:  1980-09-26   DATE OF ADMISSION:  04/28/2002  DATE OF DISCHARGE:  04/29/2002                                 DISCHARGE SUMMARY   ADDENDUM:   CONSULTATIONS:  None.   FOLLOWUP LABORATORY DATA:  1. Urine culture pending.  2. CBC for a hemoglobin and white blood cell count.  3. Urine Chlamydia test.  4. Blood culture x2 pending.                                               Rodolph Bong, M.D.    AK/MEDQ  D:  04/29/2002  T:  04/29/2002  Job:  [id number]

## 2010-06-18 NOTE — Discharge Summary (Signed)
[name], DRAGAN                       ACCOUNT NO.:  [id number]   MEDICAL RECORD NO.:  [id number]                   PATIENT TYPE:  INP   LOCATION:  5037                                 FACILITY:  [facility]   PHYSICIAN:  Pearlean Brownie, M.D.            DATE OF BIRTH:  29-Oct-1980   DATE OF ADMISSION:  04/28/2002  DATE OF DISCHARGE:  04/29/2002                                 DISCHARGE SUMMARY   PRIMARY MEDICAL PHYSICIAN:  Maurice March, M.D.   DISCHARGE DIAGNOSES:  1. Sickle cell disease (hemoglobin SS).  2. Acute pain crisis.  3. Urinary tract infection.   DISCHARGE MEDICATIONS:  1. Ibuprofen 600 mg p.o. q.6h. p.r.n. pain, #20.  2. Vicodin 5/500 mg one to two tablets p.o. q.6h. p.r.n. pain, #15.   PROCEDURES:  Chest x-ray on April 28, 2002, which revealed cardiac  enlargement with vascular congestion.   HISTORY OF PRESENT ILLNESS:  This is a 30 year old African American male  with a history of hemoglobin SS disease and a baseline hemoglobin of 9-10.5.  He presents with bilateral side pain and chest discomfort.  He has had  multiple pain crises in the past and states this the exact of those and  moderate in terms of intensity.  He does state that this is in typical  locations for him.  It started abruptly at 5 a.m. on April 28, 2002.  The  only other symptoms he could report was a headache since Friday afternoon.  He tired some BC Powder without any relief.  While in the emergency  department, he received 2 mg of Dilaudid on two separate occasions and his  pain persisted at a 4/10 rating.  He began having emesis x 2 while in the  ED.  He denied URI symptoms.  He did report some minimal shortness of breath  with this episode and no dysuria.  He was not having any fever or chills.   HOSPITAL COURSE:  #1 - ACUTE PAIN CRISIS:  The patient presented with a  history as outlined above.  He was afebrile and had good O2 saturations  while in the emergency department.  He was  found to be mildly icteric with a  normal lung exam.  He did have a 2/6 systolic ejection murmur at the left  upper sternal border and a physiologic split S2.  His white blood cell count  was found to be elevated at 22.0 and his hemoglobin was 8.4.  He did have a  thrombocytosis at 728.  His total bilirubin was 2.5 and his reticulocyte  count was 7.7.  A chest x-ray was obtained which revealed cardiac  enlargement with vascular congestion.  Consequently he was admitted and  placed on ibuprofen and a morphine PCA.  He remained afebrile during the  length of his hospitalization and did not have any further respiratory  symptoms.  His pain control was rapidly weaned and he was placed on p.o.  medications on the morning of discharge.  He actually did not require any of  these medicines that morning.  Consequently he will be discharged home with  some p.o. pain medications if he needs them.   #2 - SICKLE CELL DISEASE:  The patient has a baseline hemoglobin of 9-10.5.  His final discharge hemoglobin was 8.2.  This can be further evaluated as an  outpatient.   #3 - URINARY TRACT INFECTION:  A urinalysis was obtained which revealed  positive leukocyte esterase and a white blood count of 11-20 with white  blood cell clumps.  Of note, this was a clean-catch urine.  The urine  culture is pending at the time of discharge.  It had been reintubated for  better growth.  Incidentally, the patient had also had a positive UTI in  2002, at which time chlamydia was isolated from his urine.  Consequently a  urine chlamydia probe was added to this urinalysis and is pending at the  time of discharge.  It was strongly advised for the patient to follow up  with Maurice March, M.D., in case his urine chlamydia continued to be  positive.   PAIN MANAGEMENT:  As instructed in the medication section.   ACTIVITY:  No restrictions.   SPECIAL INSTRUCTIONS:  The patient was advised to return to [facility] with any questions or concerns.  He was  advised to keep his follow-up appointments.   FOLLOW-UP:  Maurice March, M.D., at the family practice center on Monday,  May 06, 2002, at 8:30 a.m.     Rodolph Bong, M.D.                        Pearlean Brownie, M.D.    AK/MEDQ  D:  04/29/2002  T:  04/29/2002  Job:  [id number]   cc:   Maurice March, M.D.  [facility] - Resident - [place], [place] [address]  Fax: [phone]
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
## 2010-06-18 NOTE — Discharge Summary (Signed)
NAMESHAYMUS, EVELETH             ACCOUNT NO.:  [id number]   MEDICAL RECORD NO.:  [id number]          PATIENT TYPE:  INP   LOCATION:  1621                         FACILITY:  [facility]   PHYSICIAN:  [name], M.D.   DATE OF BIRTH:  1980-03-02   DATE OF ADMISSION:  11/27/2004  DATE OF DISCHARGE:  12/01/2004                                 DISCHARGE SUMMARY   DISCHARGE DIAGNOSES:  1.  Sickle cell anemia with vasoocclusive crisis.  2.  Cholelithiasis.  3.  Left foot ulcer.  4.  Leukocytosis.  5.  Hypokalemia.  6.  Bronchitis.   DISCHARGE MEDICATIONS:  1.  Vicodin 5/500 q.4-6h. p.r.n.  2.  Hydroxyurea 500 mg p.o. b.i.d.  3.  Folic acid 1 mg p.o. daily.  4.  Dilaudid 2 mg p.o. q.4h. p.r.n.  5.  Avelox 400 mg x6 more days.   CONSULTATIONS:  None.   PROCEDURES AND DIAGNOSTIC STUDIES:  1.  Chest x-ray on November 27, 2004, showed mild peribronchial thickening,      possibly related to mild bronchitis type changes. Mild cardiomegaly and      pulmonary vascular prominence. No segmental region of consolidation.  2.  Right upper quadrant ultrasound on November 29, 2004, showed      cholelithiasis without gallbladder wall thickening or pericholecystic      fluid. There was no biliary dilatation. Small echogenic spleen.   DISCHARGE LABORATORY VALUES:  White blood cell count was 13.0, hemoglobin  9.1, hematocrit 26.2, platelets 472.   BRIEF ADMISSION HISTORY OF PRESENT ILLNESS:  The patient is a 30 year old  African-American male who was recently admitted to [facility] on  November 26, 2004, and discharged on November 27, 2004, for treatment of a  vasoocclusive crisis. One hour after he returned home, he developed nausea  and vomiting and severe pain over his right-sided chest wall. Denied any  fever or chills, did have a cough, and returned to the emergency department.  Since he had run out of pain medications he was concern that his pain would  return and he would not be with  medicine.   HOSPITAL COURSE BY PROBLEM:  #1 - BRONCHITIS. The patient did have an  elevated white blood cell count and chest x-ray findings indicative of acute  bronchitis. He was therefore put on Avelox and tolerated this well. He  remained afebrile throughout the course of his hospitalization with a white  blood cell count that was trending down at the time of discharge. He will be  discharged to complete an additional 6 days of antibiotic therapy.   #2 - VASOOCCLUSIVE CRISIS. The patient responded well to IV pain medications  and by hospital day #4, was able to be controlled with oral medicines. He  was discharged on his usual home medicines.   #3 - FOOT ULCER. The patient's foot ulcer was mostly healed. He will follow  up as an outpatient with his primary care physician for ongoing management.   DISPOSITION:  The patient is discharged home. He is instructed to follow up  with his regular physician. He is to resume his home  medications. He was  advised to keep the healed wound on his foot clean and dry. He will see Dr.  [name] in follow-up.   CONDITION ON DISCHARGE:  Improved.           ______________________________  [name], M.D.     CR/MEDQ  D:  01/06/2005  T:  01/06/2005  Job:  [id number]

## 2010-06-18 NOTE — Discharge Summary (Signed)
[name], [name]             ACCOUNT NO.:  [id number]   MEDICAL RECORD NO.:  [id number]          PATIENT TYPE:  INP   LOCATION:  1331                         FACILITY:  [facility]   PHYSICIAN:  Elliot Cousin, M.D.    DATE OF BIRTH:  12-18-1980   DATE OF ADMISSION:  05/22/2005  DATE OF DISCHARGE:  05/28/2005                                 DISCHARGE SUMMARY   DISCHARGE DIAGNOSES:  1.  Sickle cell pain crisis.  2.  Sickle cell anemia, status post 2 units of packed red blood cell      transfusions during the hospital course.  3.  Leukocytosis.  4.  Probable acute bronchitis.  5.  Hyperbilirubinemia.   For secondary discharge diagnoses and past medical history, please see the  dictated history and physical.   DISCHARGE MEDICATIONS:  1.  Dilaudid 4 mg every 6 hours as needed for pain.  2.  OxyContin SR 40 mg every 12 hours.  3.  Hydroxyurea 500 mg b.i.d.  4.  Folic acid 1 mg daily.  5.  Motrin 400 mg to 600 mg every 6 hours as needed.  6.  Azithromycin 250 mg one tablet daily for an additional 5 days.   PROCEDURES PERFORMED:  Lower extremity venous Doppler study of the left  lower extremity.  The study was negative for DVT.   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  The patient was therefore admitted for  further evaluation and management of sickle cell pain crisis.   HOSPITAL COURSE:  Problem 1. SICKLE CELL PAIN CRISIS/SICKLE CELL  ANEMIA/LEUKOCYTOSIS.  On admission, the patient's WBC was 23.9, his  hemoglobin was 9.5, his hematocrit was 28.1, and his MCV was 98.5.  The  reticulocyte percentage was 7.4, the RBC was 2.8, and the absolute  reticulocyte count was elevated at 208.7  (19-186 within normal limits).  Because of the leukocytosis, an urinalysis and chest x-ray were ordered.  The urinalysis was essentially negative and the chest x-ray revealed  cardiomegaly and no acute active process.  The patient was started on  treatment with IV fluids with half-normal saline at 125 mL an hour.  He was  started on pain management with a Dilaudid PCA pump as well.  The following  day, the patient wanted the PCA pump discontinued and therefore he was  started on intravenous Dilaudid therapy 2-4 mg every 3 hours as needed for  pain.  In addition, OxyContin was started at 30 mg p.o. q.12 h as well as  ibuprofen 600 mg p.o. t.i.d.  The OxyContin was eventually increased to 40  mg q.12 h.  He was given a heating pad for symptomatic relief of low back  pain.  He also particularly complained of pain in his left leg greater than  the right leg.  A venous Doppler study was  ordered to rule out DVT.  The  Doppler study was negative.  The leukocytosis remained throughout the  hospitalization.  As above, on admission his white blood cell count was 23.9  and at the time of hospital discharge it was 18.9.  A follow-up chest x-ray  revealed no acute changes.  However, given his symptomatic cough, Rocephin  and azithromycin were started empirically.   The patient's hemoglobin and hematocrit were monitored throughout the  hospitalization.  The hemoglobin gradually fell from 9.5 to 6.4.  He was  typed and crossed 2 units of packed red blood cells and transfused both  units.  Following the transfusion, his hemoglobin improved to 8.0.  Following several days of treatment, the patient's symptoms subsided.  He  was therefore discharged to home on April28,2007.  The patient was advised  to follow up with his primary care physician Dr. [name] in 5-7 days.   Problem 2. PROBABLE ACUTE BRONCHITIS.  The patient complained of a cough  throughout the hospitalization.  On exam he did have a few  expiratory  wheezes and pulmonary crackles.  The chest x-rays as ordered, revealed no  acute active process.  However, given the leukocytosis and sickle cell  disease, the patient was started empirically on Rocephin and azithromycin.  At the time of hospital discharge, his symptoms had subsided.  He was  advised to continue antibiotic therapy with azithromycin for an additional  five more days.  The patient was also treated symptomatically with albuterol  nebulizers during hospital course.   Problem 3. HYPERBILIRUBINEMIA.  On admission, the patient's total bilirubin  was 6.4.  The AST and ALT were within normal limits.  The patient does have  a history of cholelithiasis however he experienced no abdominal pain during  the hospital course.  At the time of hospital discharge, his total bilirubin  improved to 4.1.  The hyperbilirubinemia is secondary to the sickle cell  disease.      Elliot Cousin, M.D.  Electronically Signed     DF/MEDQ  D:  05/30/2005  T:  05/30/2005  Job:  [id number]   cc:   Lorelle Formosa, M.D.  Fax: [phone]

## 2010-06-18 NOTE — Discharge Summary (Signed)
[name], [name]                       ACCOUNT NO.:  [id number]   MEDICAL RECORD NO.:  [id number]                   PATIENT TYPE:  INP   LOCATION:  3021                                 FACILITY:  [facility]   PHYSICIAN:  Jonna L. Robb Matar, M.D.            DATE OF BIRTH:  August 24, 1980   DATE OF ADMISSION:  07/20/2003  DATE OF DISCHARGE:  07/22/2003                                 DISCHARGE SUMMARY   PRIMARY CARE PHYSICIAN:  Unassigned.   FINAL DIAGNOSES:  1. Abscess, right groin.  2. Sickle cell crisis.  3. Hypokalemia.   Procedure was done pre-hospital, I&D of abscess.   ALLERGIES:  None.   CODE STATUS:  Full.   HISTORY:  This is a 30 year old African-American male who several days prior  had had an I&D of a right groin abscess.  He was started on Keflex on June  17, but he had some low-grade fever and chills and the night of admission he  awoke from sleep with pains in the left thigh, chest, legs, and his typical  sickle cell crisis.  He gets maybe every three to four months an episode of  this.   PHYSICAL EXAMINATION:  Most notable for wound in the groin that was draining  and generalized pain.   HOSPITAL COURSE:  The patient was put on IV fluids, Dilaudid, Percocet, and  restarted on Keflex.  The abscess opened up and drained the last little bit,  and after that the patient started to feel a lot better.  He was also put on  some potassium as well.  By the next day the pain was completely gone, he  remained afebrile.  There was no pain around the abscess, and the patient  felt like he was ready to go back to work.   LABORATORY DATA:  The patient is continuing to have some leukocytosis.  White count is 19.0-19.5, hemoglobin is at 8.8-9.0, which is his usual.  He  was HIV-negative.  His potassium at discharge was 3.2.   DISPOSITION:  He will be discharged on Keflex 500 mg q.i.d. for six more  days, Percocet p.r.n.  He is to avoid irritation to the abscess, clean it  with peroxide, Neosporin, and cover with a clean non-stick bandage.  I want  him to eat extra bananas, melon, and fruit to increase his potassium.  Apparently he has not been seen for a long time by a hematologist.  I am  going to arrange for him to go to an appointment with them.                                               Jonna L. Robb Matar, M.D.   Dorna Bloom  D:  07/22/2003  T:  07/24/2003  Job:  [id number]   cc:  [facility]
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
## 2010-06-18 NOTE — H&P (Signed)
NAMESHAVON, ZENZ                       ACCOUNT NO.:  [id number]   MEDICAL RECORD NO.:  [id number]                   PATIENT TYPE:  INP   LOCATION:  5037                                 FACILITY:  [facility]   PHYSICIAN:  Pearlean Brownie, M.D.            DATE OF BIRTH:  August 19, 1980   DATE OF ADMISSION:  04/28/2002  DATE OF DISCHARGE:                                HISTORY & PHYSICAL   PRIMARY CARE PHYSICIAN:  Maurice March, M.D.  [facility].   CHIEF COMPLAINT:  Bilateral side pain.   HISTORY OF PRESENT ILLNESS:  This is a 30 year old African-American male  with a history of sickle cell anemia (hemoglobin SS disease) and multiple  pain crises in the past who presents with bilateral side pain and chest  discomfort.  He states this is a moderate pain crisis for him and the pain  is in typical locations.  It started roughly at 5 a.m. this morning prior to  only having a headache since Friday.  Denied any other symptoms.  He tried  to a BC powder at home without any relief.  While in the emergency  department, he experienced some relief from the Dilaudid which decreased his  pain level to a 4/10.  However, he began having two separate episodes of  emesis while in the emergency department.  He denies any URI symptoms, but  does report some minimal shortness of breath with these episodes.  He denies  any dysuria.   REVIEW OF SYSTEMS:  No fevers, chills or weight changes.  He does report  some chest discomfort as per HPI.  RESPIRATORY:  Minor shortness of breath.  He does have a cough that is nonproductive. GI:  Does have some abdominal  pain last week for two days and decreased p.o. intake at that time.  SKIN:  He denies any rashes.  NEURO:  He reports a headache since Friday. No  weakness, tingling, numbness.  Denies any joint pains or vision changes.  Denies any sore throat or dysuria.   PAST MEDICAL HISTORY:  1. Sickle cell anemia, hemoglobin baseline at 9 to 10.5.  2. Multiple sickle cell pain crises per patient, greater than 10 to 15.   MEDICATIONS:  None currently.   ALLERGIES:  No known drug allergies.   SOCIAL HISTORY:  He denies any tobacco or alcohol or other drug use.  He  apparently lives in apartment by himself.  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   FAMILY HISTORY:  His father was shot and died at that time.  His mother has  diabetes mellitus.  He has an aunt with an unknown cancer.  He had a  maternal grandfather who died of an myocardial infarction. He does not know  the age.   PHYSICAL EXAMINATION:  VITAL SIGNS:  Temperature 97.6, blood pressure  136/69, heart rate 69, respiratory rate 20, oxygen saturation 98% on room  air, then slightly decreased to 96% on room air but responded to 99% on two  liters nasal cannula.  GENERAL:  He is a pleasant African-American male in mild discomfort.  HEENT:  Mildly icteric.  Pupils are equal, round and reactive to light and  accommodation. His nares are patent.  His right TM is blocked with cerumen.  He has decreased hearing secondary to a prior injury in that ear as well.  His oropharynx benign without any erythema or exudate.  NECK:  Supple.  No lymphadenopathy or thyromegaly.  LUNGS:  Respiratory exam is nonlabored.  Clear to auscultation bilaterally.  No crackles or wheezes.  CARDIOVASCULAR:  Regular  rate and rhythm.  He does have a 2/6 systolic  ejection murmur at the left upper sternal border.  There is a physiologic  split S2.  GI:  Soft.  Normoactive bowel sounds.  Nontender.  Nondistended.  No  palpable masses.  NEUROLOGIC:  His cranial nerves II-XII are grossly intact.  Strength is 5/5  in bilateral upper and lower extremities.  He has normal sensation  throughout.   LABORATORY DATA:  His white blood count is 22.0 with an ANC of 17.4.  His  hemoglobin is 8.4 with a hematocrit of 24.5.  Platelets are 728.  His RBC  morphology on his smear reveals sickle cells.   His basic metabolic panel  reveals sodium 136, potassium 3.6, chloride 106, bicarb 25, BUN 10,  creatinine 0.5, glucose 118.  His calcium is 9.0, total protein 7.4, albumin  3.8, AST 19, ALT 16, alk phos 70, total bilirubin 2.5. CK 70, retic count  7.7, RBC of 2.76, absolute retic count 212.5.  He had an EKG which revealed  sinus bradycardia at 59.  Flipped T-waves in V1, V2 with J point elevation  V1 through V3.  Chest x-ray reveals cardiac enlargement with vascular  congestion only.   ASSESSMENT/PLAN:  A 30 year old African-American male with a history of  hemoglobin SS disease who presents with acute pain crisis.  1. Sickle cell pain crisis developed acutely this a.m. typical of a pain     crisis.  It did not appear to be acute chest with no O2 requirement.     Afebrile.  Negative chest x-ray findings.  Will admit for pain control.     Schedule ibuprofen and morphine PCA.  We will hydrate with IV fluids.     Use Phenergan for nausea.  There is no need for antibiotics at this point     in time.  We will monitor temperature and check blood culture x2 sickle     cell disease.  Will add Folate and discuss hydroxyurea urea with team.   1. FEN.  Will use IV hydration and regular diet.     Rodolph Bong, M.D.                        Pearlean Brownie, M.D.    AK/MEDQ  D:  04/28/2002  T:  04/29/2002  Job:  [id number]   cc:   Maurice March, M.D.  [facility]. Med - Resident - [place], [place] [address]  Fax: [phone]

## 2010-06-26 IMAGING — CR DG CHEST 2V
2 series · 2 of 2 positions shown · non-contrast
Comparison: 07/29/2007

CLINICAL DATA: Right-sided chest pain.  No injury.  Sickle cell
crisis.

CHEST - 2 VIEW

[w chest pa]
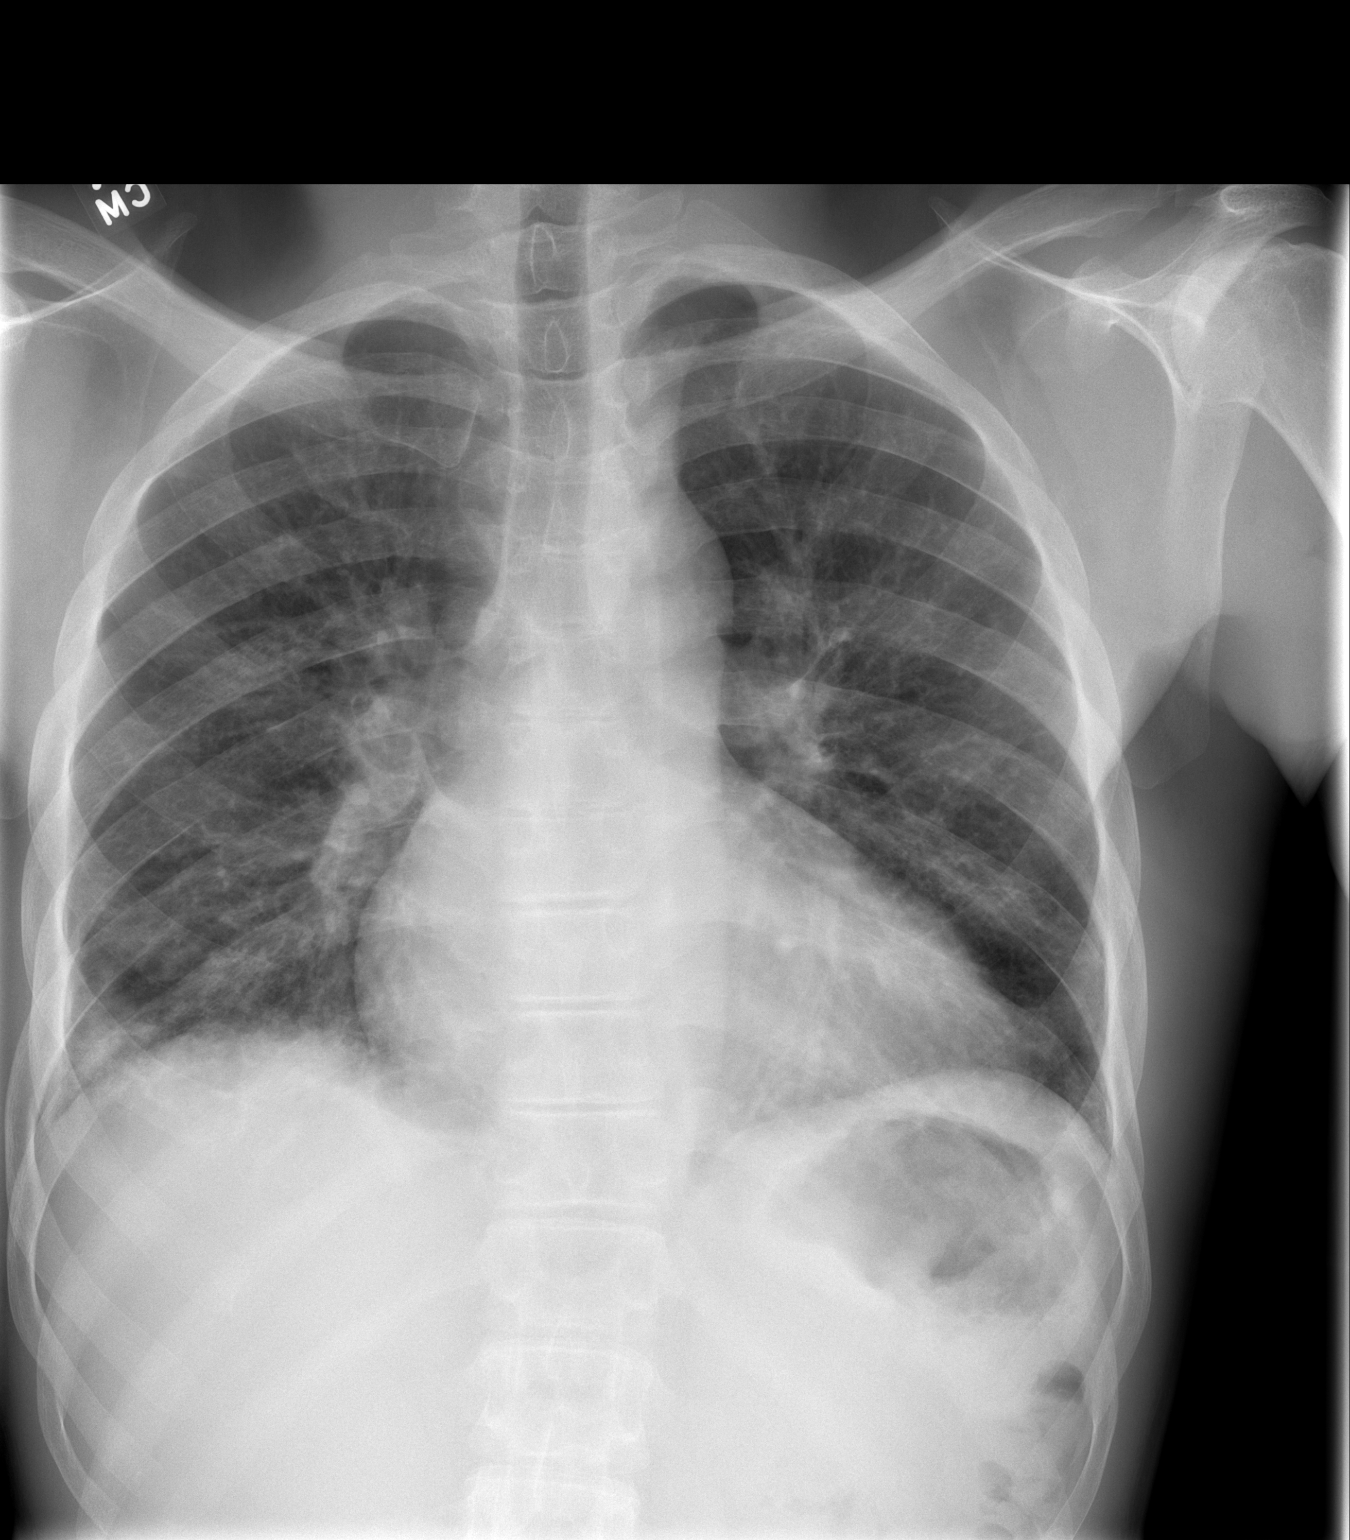

[w chest lat]
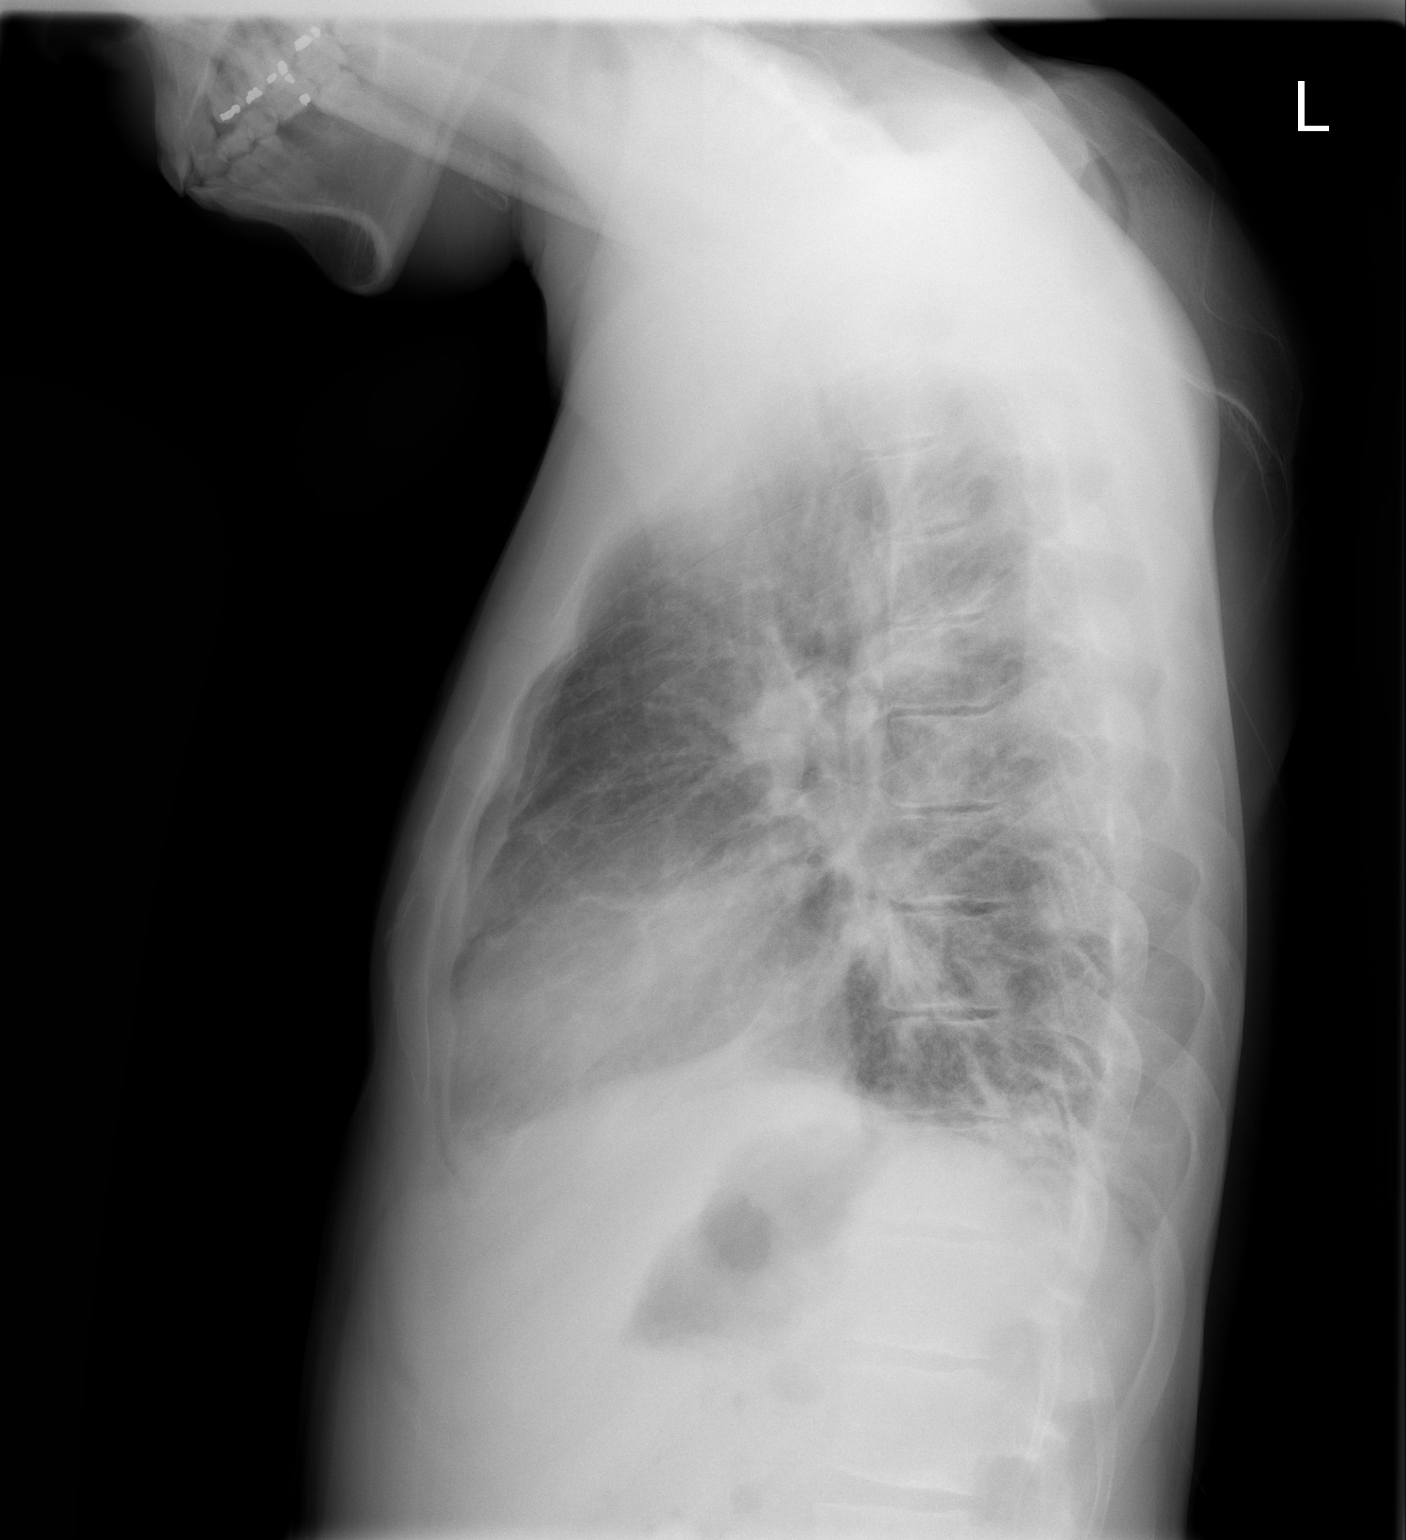

[2 of 2 positions shown; findings below may reference images not displayed]

FINDINGS: Heart is enlarged.  There is prominence of interstitial
markings.  Perihilar peribronchial changes are present.  The right
lung base, there is patchy infiltrate partially obscuring the
hemidiaphragm.  Findings are consistent with atelectasis or
infiltrate.
IMPRESSION: Cardiomegaly and chronic lung changes.  Right lower lobe
atelectasis or infiltrate.

## 2010-07-06 ENCOUNTER — Emergency Department (HOSPITAL_COMMUNITY): Payer: Medicare Other

## 2010-07-06 ENCOUNTER — Emergency Department (HOSPITAL_COMMUNITY)
Admission: EM | Admit: 2010-07-06 | Discharge: 2010-07-06 | Disposition: A | Discharge status: Home / Self Care | Payer: Medicare Other | Attending: Emergency Medicine | Admitting: Emergency Medicine

## 2010-07-06 DIAGNOSIS — R0602 Shortness of breath: Secondary | ICD-10-CM | POA: Insufficient documentation

## 2010-07-06 DIAGNOSIS — D5700 Hb-SS disease with crisis, unspecified: Secondary | ICD-10-CM | POA: Insufficient documentation

## 2010-07-06 DIAGNOSIS — R079 Chest pain, unspecified: Secondary | ICD-10-CM | POA: Insufficient documentation

## 2010-07-06 LAB — CBC
Hemoglobin: 8.5 g/dL — ABNORMAL LOW (ref 13.0–17.0)
MCH: 30.4 pg (ref 26.0–34.0)
MCHC: 33.6 g/dL (ref 30.0–36.0)
MCV: 90.4 fL (ref 78.0–100.0)

## 2010-07-06 LAB — COMPREHENSIVE METABOLIC PANEL
ALT: 14 U/L (ref 0–53)
Alkaline Phosphatase: 105 U/L (ref 39–117)
CO2: 18 mEq/L — ABNORMAL LOW (ref 19–32)
Calcium: 9.5 mg/dL (ref 8.4–10.5)
Chloride: 106 mEq/L (ref 96–112)
GFR calc non Af Amer: >60 mL/min (ref >60)
Glucose, Bld: 92 mg/dL (ref 70–99)
Potassium: 3.2 mEq/L — ABNORMAL LOW (ref 3.5–5.1)
Sodium: 137 mEq/L (ref 135–145)
Total Bilirubin: 3.3 mg/dL — ABNORMAL HIGH (ref 0.3–1.2)

## 2010-07-06 LAB — DIFFERENTIAL
Eosinophils Relative: 2 % (ref 0–5)
Lymphs Abs: 2 10*3/uL (ref 0.7–4.0)
Monocytes Absolute: 2.4 10*3/uL — ABNORMAL HIGH (ref 0.1–1.0)

## 2010-07-06 LAB — RETICULOCYTES: Retic Ct Pct: 8.1 % — ABNORMAL HIGH (ref 0.4–3.1)

## 2010-07-08 ENCOUNTER — Inpatient Hospital Stay (HOSPITAL_COMMUNITY)
Admission: EM | Admit: 2010-07-08 | Discharge: 2010-07-21 | DRG: 812 | Disposition: A | Discharge status: Home / Self Care | Payer: Medicare Other | Attending: Internal Medicine | Admitting: Internal Medicine

## 2010-07-08 DIAGNOSIS — D5700 Hb-SS disease with crisis, unspecified: Principal | ICD-10-CM | POA: Diagnosis present

## 2010-07-08 DIAGNOSIS — D72829 Elevated white blood cell count, unspecified: Secondary | ICD-10-CM | POA: Diagnosis present

## 2010-07-08 DIAGNOSIS — R82998 Other abnormal findings in urine: Secondary | ICD-10-CM | POA: Diagnosis present

## 2010-07-08 DIAGNOSIS — D473 Essential (hemorrhagic) thrombocythemia: Secondary | ICD-10-CM | POA: Diagnosis present

## 2010-07-08 DIAGNOSIS — Z86718 Personal history of other venous thrombosis and embolism: Secondary | ICD-10-CM

## 2010-07-08 DIAGNOSIS — M87059 Idiopathic aseptic necrosis of unspecified femur: Secondary | ICD-10-CM | POA: Diagnosis present

## 2010-07-08 DIAGNOSIS — F411 Generalized anxiety disorder: Secondary | ICD-10-CM | POA: Diagnosis present

## 2010-07-08 DIAGNOSIS — M87029 Idiopathic aseptic necrosis of unspecified humerus: Secondary | ICD-10-CM | POA: Diagnosis present

## 2010-07-08 DIAGNOSIS — J209 Acute bronchitis, unspecified: Secondary | ICD-10-CM | POA: Diagnosis present

## 2010-07-08 DIAGNOSIS — Z79899 Other long term (current) drug therapy: Secondary | ICD-10-CM

## 2010-07-08 DIAGNOSIS — D599 Acquired hemolytic anemia, unspecified: Secondary | ICD-10-CM | POA: Diagnosis present

## 2010-07-08 DIAGNOSIS — F1011 Alcohol abuse, in remission: Secondary | ICD-10-CM | POA: Diagnosis present

## 2010-07-08 DIAGNOSIS — I10 Essential (primary) hypertension: Secondary | ICD-10-CM | POA: Diagnosis present

## 2010-07-08 DIAGNOSIS — E876 Hypokalemia: Secondary | ICD-10-CM | POA: Diagnosis present

## 2010-07-09 ENCOUNTER — Inpatient Hospital Stay (HOSPITAL_COMMUNITY): Payer: Medicare Other

## 2010-07-09 ENCOUNTER — Emergency Department (HOSPITAL_COMMUNITY): Payer: Medicare Other

## 2010-07-09 LAB — DIFFERENTIAL
Basophils Absolute: 0.2 10*3/uL — ABNORMAL HIGH (ref 0.0–0.1)
Eosinophils Relative: 1 % (ref 0–5)
Lymphocytes Relative: 12 % (ref 12–46)
Lymphs Abs: 2.5 10*3/uL (ref 0.7–4.0)
Monocytes Absolute: 2.4 10*3/uL — ABNORMAL HIGH (ref 0.1–1.0)
Neutro Abs: 15.3 10*3/uL — ABNORMAL HIGH (ref 1.7–7.7)

## 2010-07-09 LAB — HEPATIC FUNCTION PANEL
ALT: 16 U/L (ref 0–53)
Albumin: 4 g/dL (ref 3.5–5.2)
Alkaline Phosphatase: 90 U/L (ref 39–117)
Total Bilirubin: 4.9 mg/dL — ABNORMAL HIGH (ref 0.3–1.2)
Total Protein: 6.8 g/dL (ref 6.0–8.3)

## 2010-07-09 LAB — CBC
HCT: 21 % — ABNORMAL LOW (ref 39.0–52.0)
Hemoglobin: 7 g/dL — ABNORMAL LOW (ref 13.0–17.0)
MCH: 30.2 pg (ref 26.0–34.0)
MCHC: 33.9 g/dL (ref 30.0–36.0)
MCV: 89.4 fL (ref 78.0–100.0)
MCV: 89.1 fL (ref 78.0–100.0)
Platelets: 594 10*3/uL — ABNORMAL HIGH (ref 150–400)
RDW: 16.6 % — ABNORMAL HIGH (ref 11.5–15.5)
RDW: 17.3 % — ABNORMAL HIGH (ref 11.5–15.5)
WBC: 20.7 10*3/uL — ABNORMAL HIGH (ref 4.0–10.5)

## 2010-07-09 LAB — BASIC METABOLIC PANEL
BUN: 5 mg/dL — ABNORMAL LOW (ref 6–23)
CO2: 20 mEq/L (ref 19–32)
Calcium: 8.7 mg/dL (ref 8.4–10.5)
Creatinine, Ser: <0.47 mg/dL (ref 0.4–1.5)
Glucose, Bld: 90 mg/dL (ref 70–99)
Sodium: 135 mEq/L (ref 135–145)

## 2010-07-09 LAB — POCT I-STAT, CHEM 8
BUN: 5 mg/dL — ABNORMAL LOW (ref 6–23)
Calcium, Ion: 1.16 mmol/L (ref 1.12–1.32)
Chloride: 110 mEq/L (ref 96–112)
Glucose, Bld: 100 mg/dL — ABNORMAL HIGH (ref 70–99)
Potassium: 2.7 mEq/L — CL (ref 3.5–5.1)

## 2010-07-09 LAB — URINALYSIS, ROUTINE W REFLEX MICROSCOPIC
Bilirubin Urine: NEGATIVE
Ketones, ur: NEGATIVE mg/dL
Nitrite: NEGATIVE
Protein, ur: NEGATIVE mg/dL
Urobilinogen, UA: 2 mg/dL — ABNORMAL HIGH (ref 0.0–1.0)

## 2010-07-09 LAB — RETICULOCYTES: Retic Ct Pct: 9.8 % — ABNORMAL HIGH (ref 0.4–3.1)

## 2010-07-10 LAB — CBC
Hemoglobin: 8 g/dL — ABNORMAL LOW (ref 13.0–17.0)
MCH: 31 pg (ref 26.0–34.0)
Platelets: 544 10*3/uL — ABNORMAL HIGH (ref 150–400)
RBC: 2.58 MIL/uL — ABNORMAL LOW (ref 4.22–5.81)

## 2010-07-10 LAB — BASIC METABOLIC PANEL
CO2: 21 mEq/L (ref 19–32)
Calcium: 8.7 mg/dL (ref 8.4–10.5)
Potassium: 3 mEq/L — ABNORMAL LOW (ref 3.5–5.1)
Sodium: 136 mEq/L (ref 135–145)

## 2010-07-10 LAB — MAGNESIUM: Magnesium: 1.6 mg/dL (ref 1.5–2.5)

## 2010-07-11 LAB — CBC
Platelets: 522 10*3/uL — ABNORMAL HIGH (ref 150–400)
RBC: 2.48 MIL/uL — ABNORMAL LOW (ref 4.22–5.81)
RDW: 18.2 % — ABNORMAL HIGH (ref 11.5–15.5)
WBC: 22.2 10*3/uL — ABNORMAL HIGH (ref 4.0–10.5)

## 2010-07-11 LAB — BASIC METABOLIC PANEL
BUN: 3 mg/dL — ABNORMAL LOW (ref 6–23)
CO2: 21 mEq/L (ref 19–32)
Calcium: 9 mg/dL (ref 8.4–10.5)
Glucose, Bld: 98 mg/dL (ref 70–99)
Potassium: 3.4 mEq/L — ABNORMAL LOW (ref 3.5–5.1)

## 2010-07-11 LAB — URINE CULTURE
Colony Count: NO GROWTH
Culture: NO GROWTH
Special Requests: NEGATIVE

## 2010-07-11 IMAGING — CR DG CHEST 2V
2 series · 2 of 2 positions shown · non-contrast
Comparison: 08/03/2007

CLINICAL DATA: Chest pain

CHEST - 2 VIEW

[w chest pa]
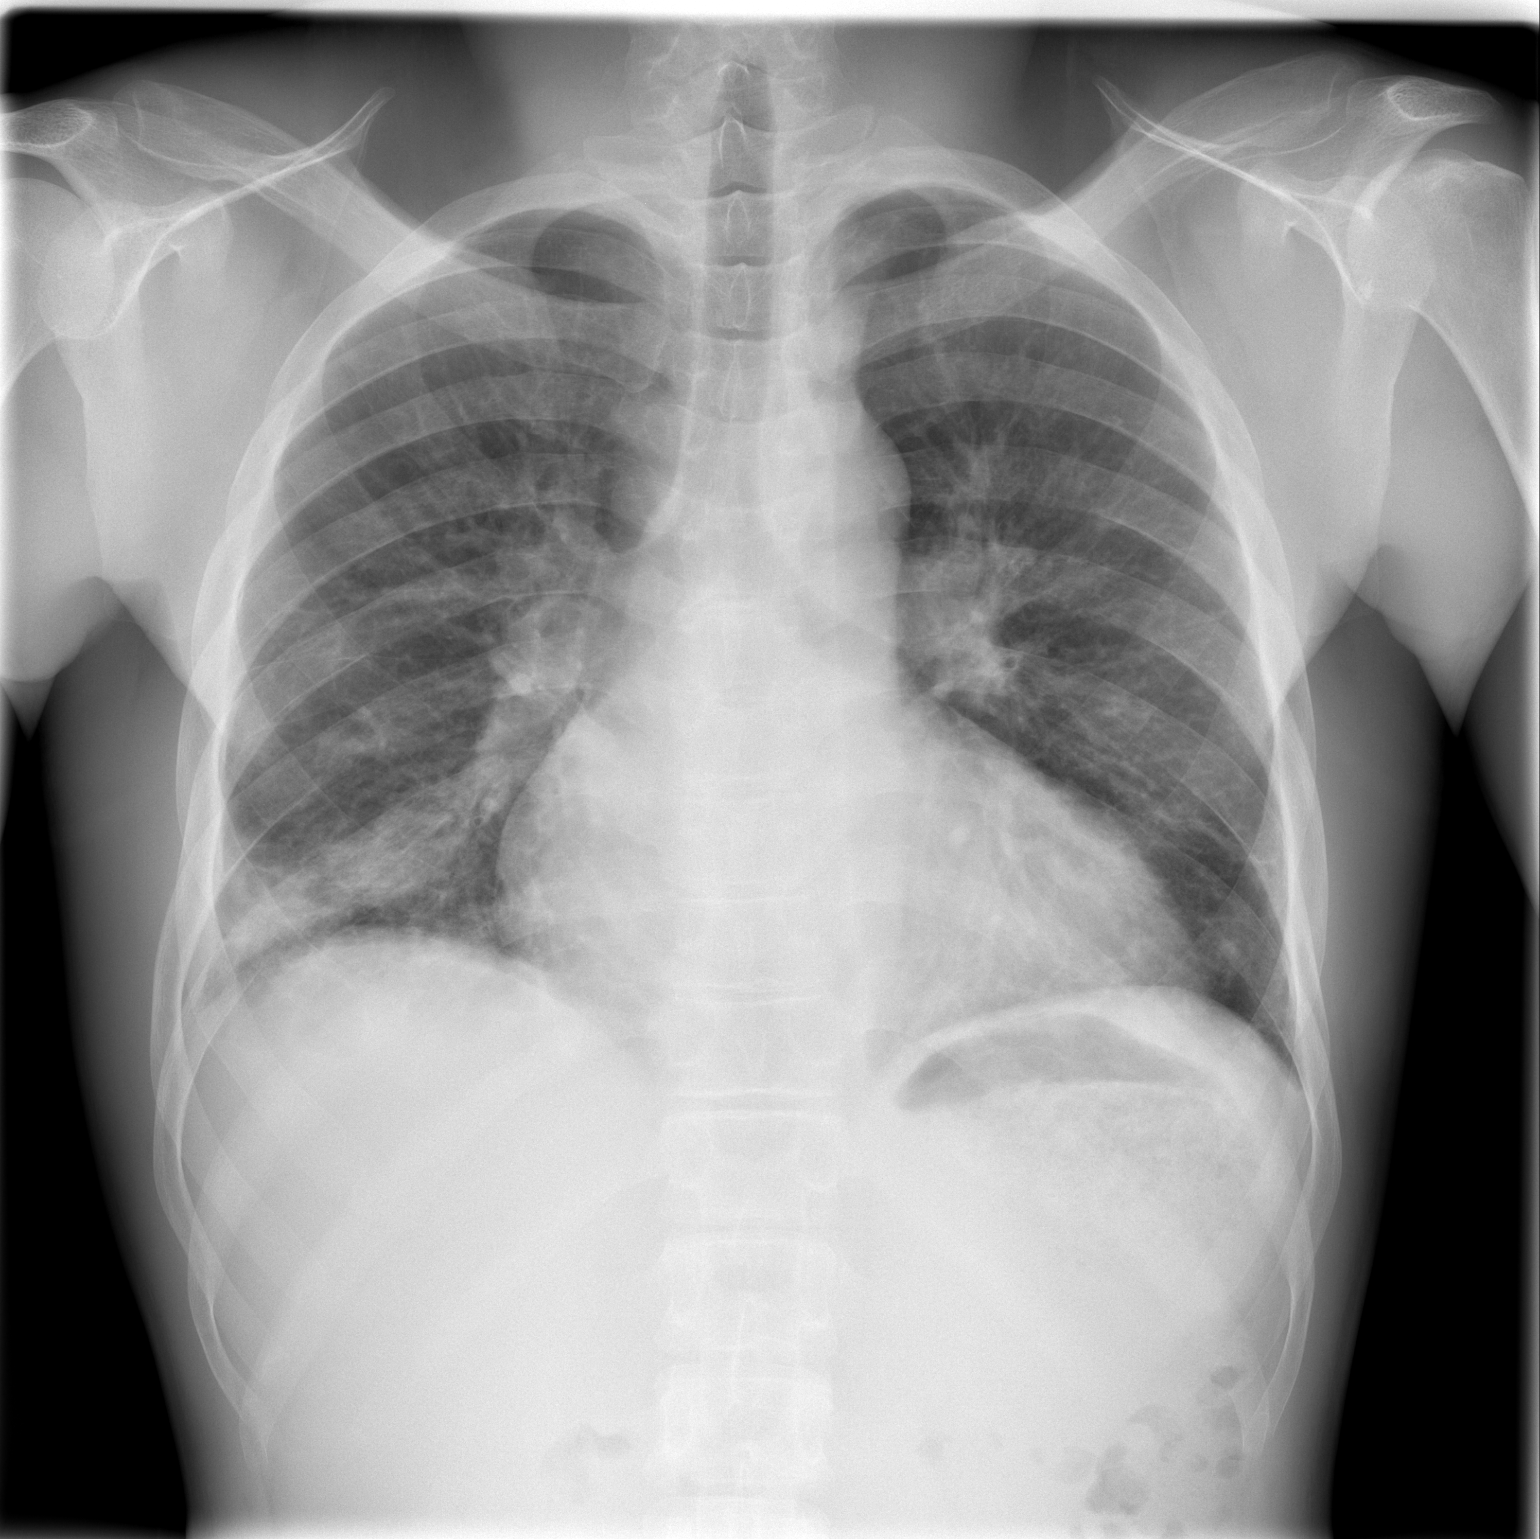

[w chest lat]
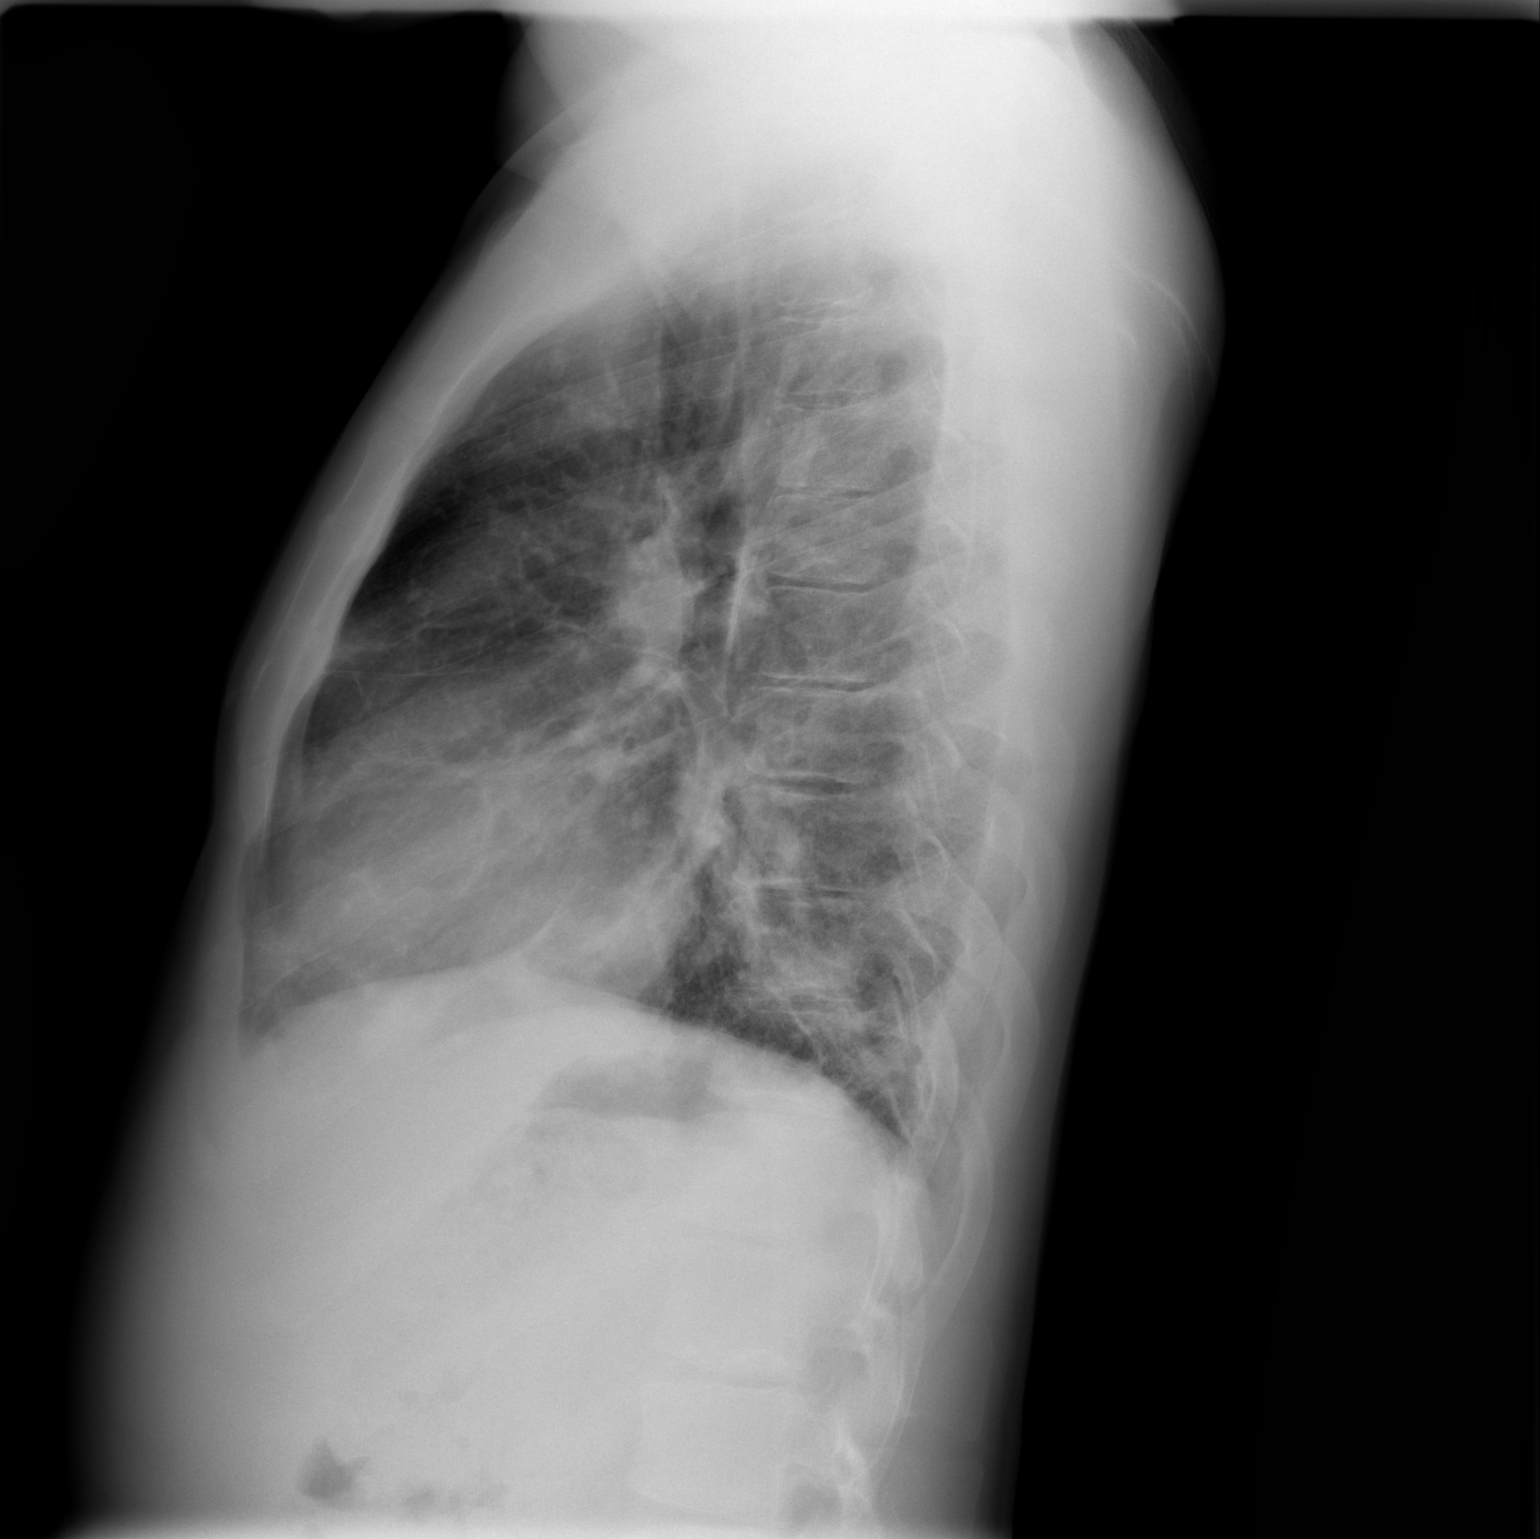

[2 of 2 positions shown; findings below may reference images not displayed]

FINDINGS: There is moderate cardiac enlargement, stable.

No pleural effusion is identified.

Increased airspace disease at the right base is noted.
IMPRESSION: 1.  Worsening aeration to the right base which is concerning for
pneumonia.

## 2010-07-11 IMAGING — CT CT ANGIO CHEST
2 of 5 series · 18 of 36 positions shown · IV contrast (APPLIED)
Comparison: 05/10/2007

CLINICAL DATA: Chest pain, shaking.  Pneumonia, sickle cell.

CT ANGIOGRAPHY CHEST
TECHNIQUE: Multidetector CT imaging of the chest using the
standard protocol during bolus administration of intravenous
contrast. Multiplanar reconstructed images obtained and reviewed to
evaluate the vascular anatomy.
Contrast: 100 ml [id number]

[Series 8: pulm embolism 1.0 b25f thins · axial · 0.71mm/px · z∈[-210,+104]mm · 15 of 353 slices shown]
[im 19/353  lung]
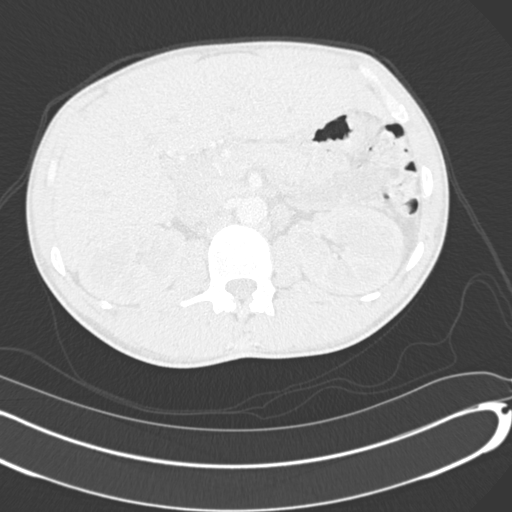
[im 38/353  mediastinal]
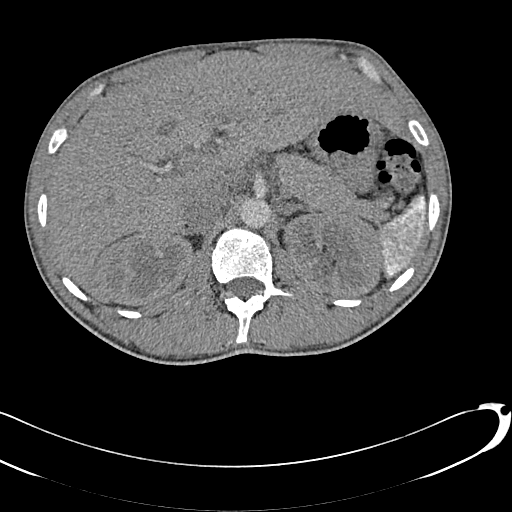
[im 75/353  lung]
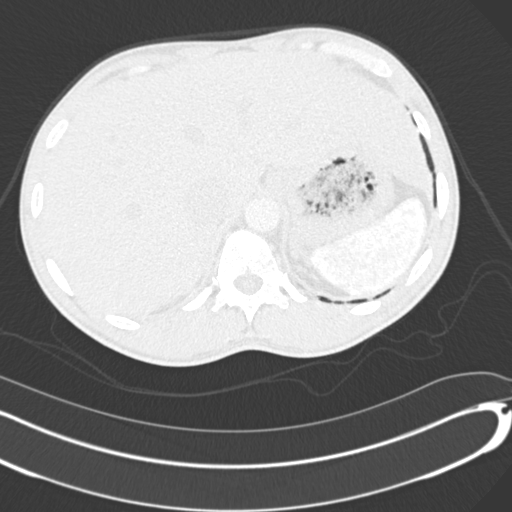
[im 93/353  mediastinal]
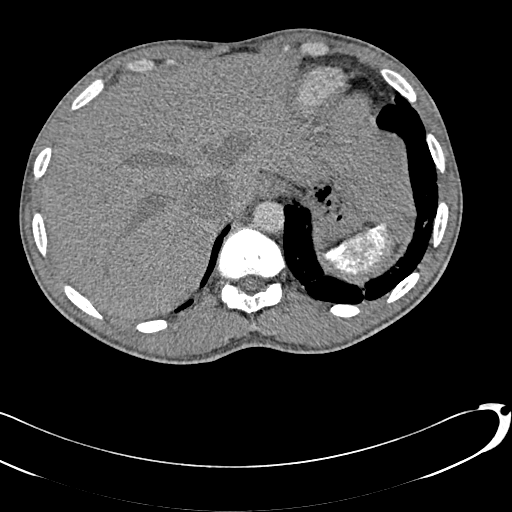
[im 112/353  lung]
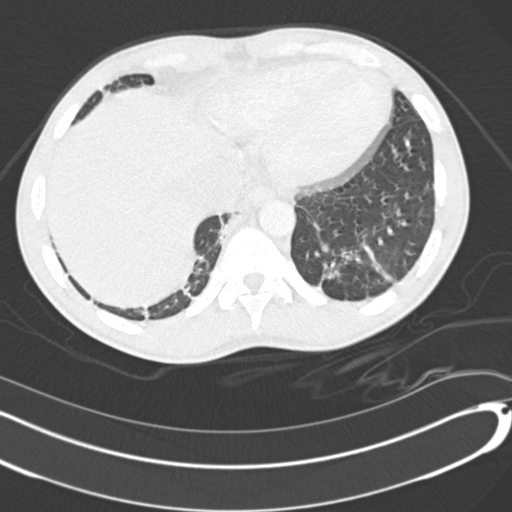
[im 130/353  mediastinal]
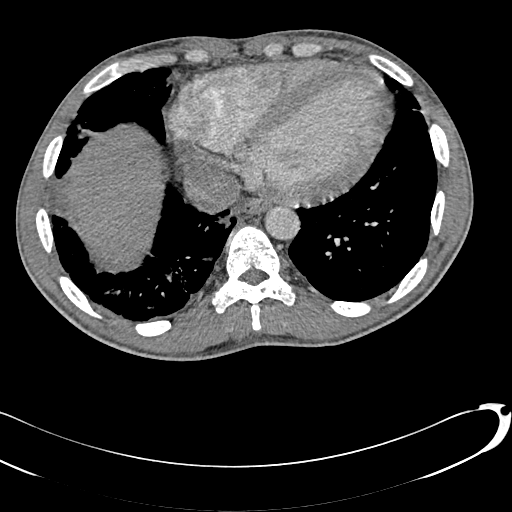
[im 149/353  lung]
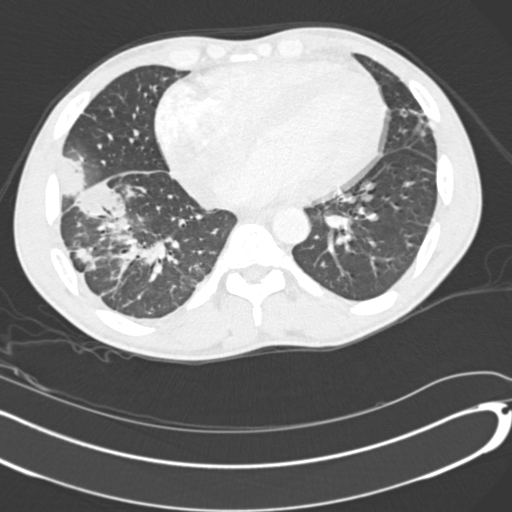
[im 186/353  mediastinal]
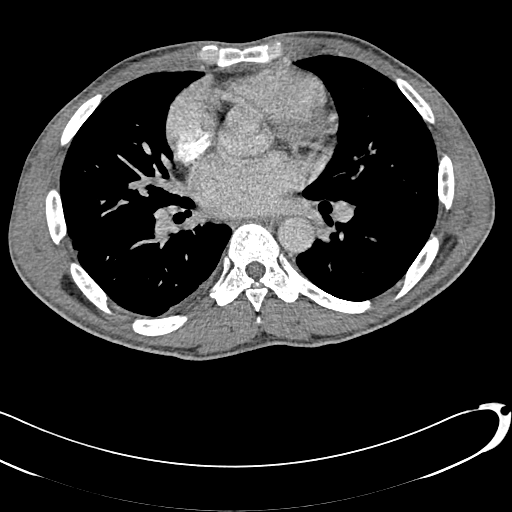
[im 204/353  lung]
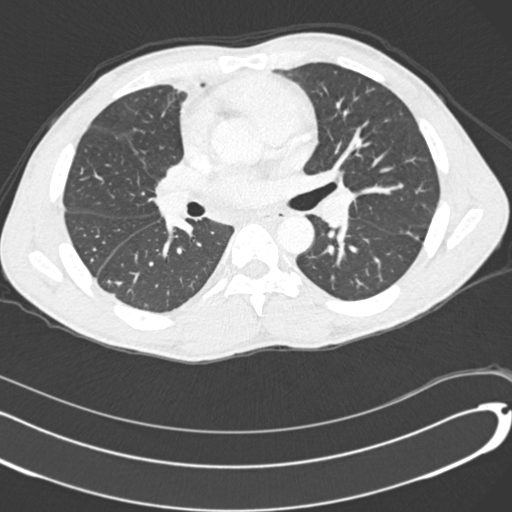
[im 223/353  mediastinal]
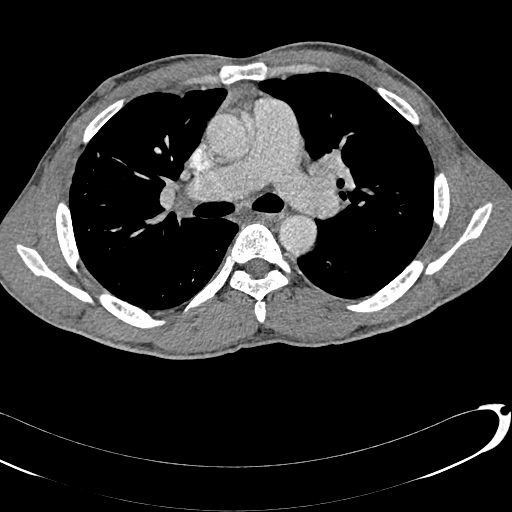
[im 241/353  lung]
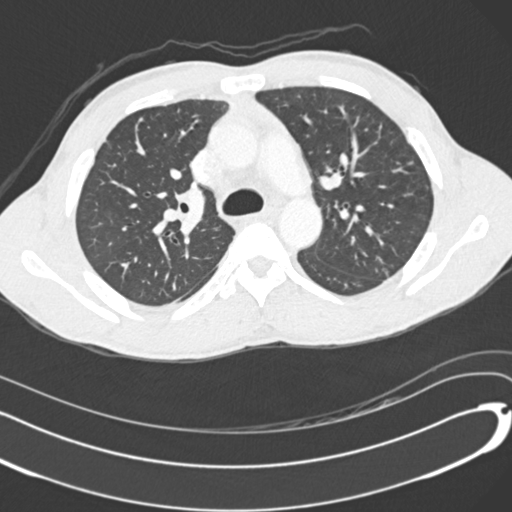
[im 260/353  mediastinal]
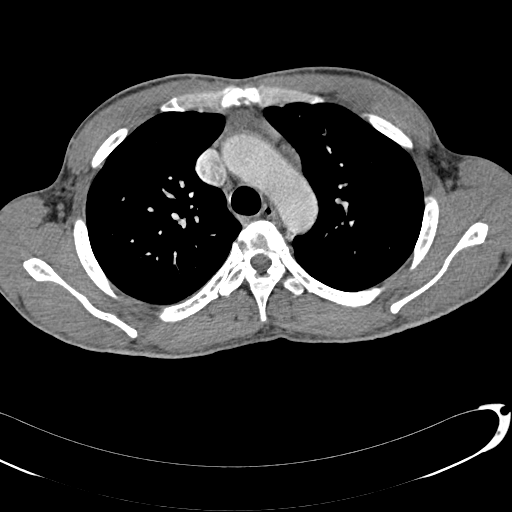
[im 297/353  lung]
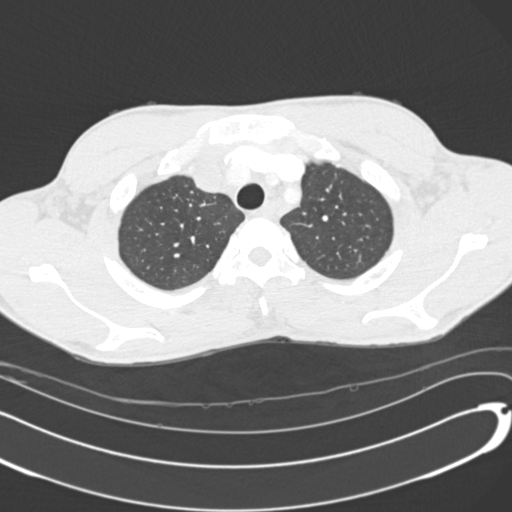
[im 315/353  mediastinal]
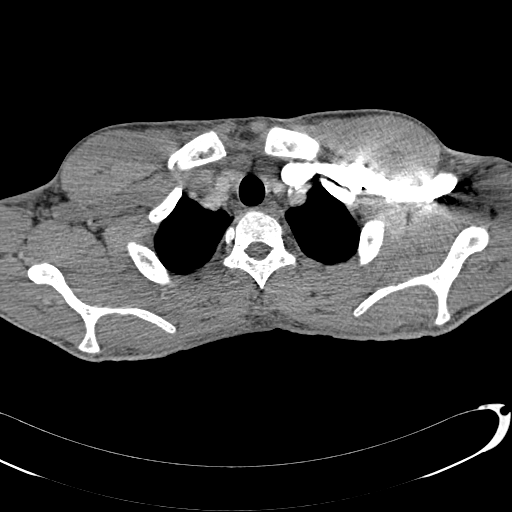
[im 334/353  lung]
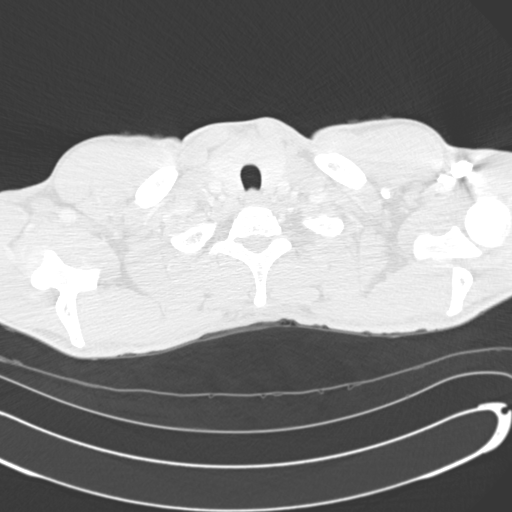

[Series 602: cor pe · coronal · 0.71mm/px · 3 of 102 slices shown]
[im 21/102  mediastinal]
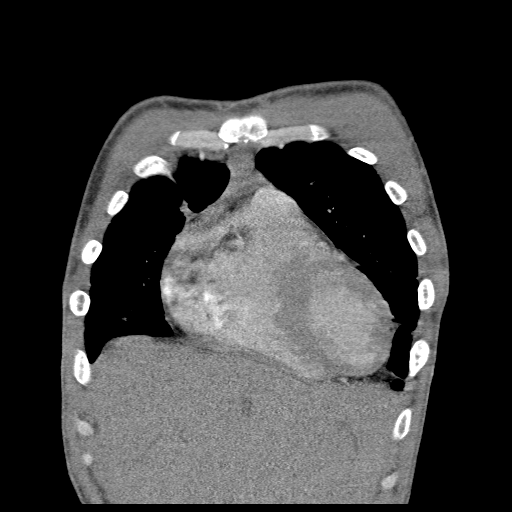
[im 41/102  mediastinal]
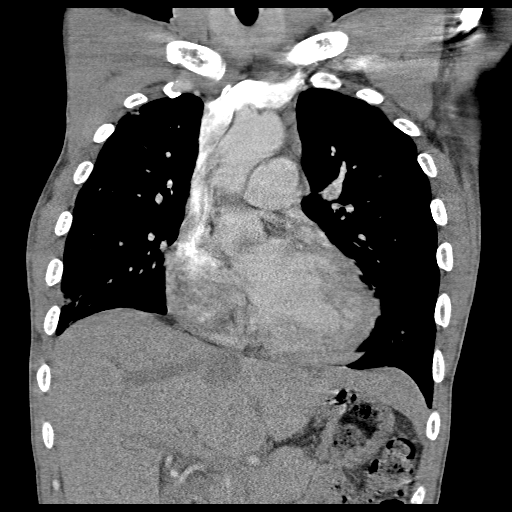
[im 61/102  mediastinal]
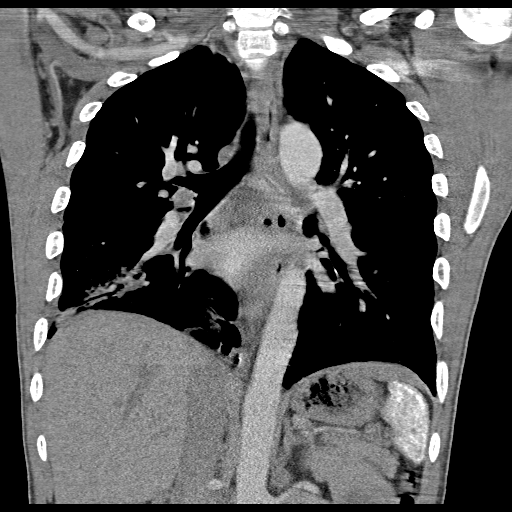

[18 of 36 positions shown; findings below may reference images not displayed]

FINDINGS: This study is not technically adequate to evaluate for
pulmonary embolus due to bolus dispersion.  Prominent anterior
mediastinal soft tissue is present consistent with residual thymus.
Incidental imaging of the upper abdomen is unremarkable.  Small
nonpathologically enlarged mediastinal lymph nodes.  Cardiomegaly.

Working within the limitations of the study, there are no large
central pulmonary emboli.  Smaller vessels are not evaluated.

Lung windows demonstrate airspace disease in the anterior aspect of
the superior segment of the right lower lobe, lateral right middle
lobe, and scattered areas of pulmonary parenchymal scarring.  Small
amount of airspace disease is present in the medial basilar segment
of the right lower lobe.

Typical bony changes in the thoracic spine of sickle cell anemia,
with "H" shaped vertebral bodies, consistent with endplate
infarction.  Partial visualization of the humeral heads shows no
definite evidence of AVN.  Vague sclerosis in the left humeral head
probably represents the physeal scar. Incidental imaging the
abdomen demonstrates partial calcification of the spleen,
consistent with autoinfarction.
IMPRESSION: 1.  Study is not technically adequate to evaluate for pulmonary
embolus.
2.  Scattered foci of right lung airspace disease in the right
lower lobe and right middle lobe could represent infection or
infarction in a patient with sickle cell anemia. Lung findings are
compatible with acute chest syndrome.  Scattered areas of pulmonary
scarring from sickle cell infarcts.
3.  Cardiomegaly.

## 2010-07-11 NOTE — H&P (Signed)
[name], THRUSH             ACCOUNT NO.:  [id number]  MEDICAL RECORD NO.:  [id number]  LOCATION:  WLED                         [facility]ACILITY[facility]  PHYSICIAN:  Michiel Cowboy, MDDATE OF BIRTH:  1980/06/25  DATE OF ADMISSION:  07/08/2010 DATE OF DISCHARGE:                             HISTORY & PHYSICAL   PRIMARY CARE PROVIDER:  Dr. [name], although he has been seen by Dr. [name] when he was in the hospital.  CHIEF COMPLAINT:  Flank pain and back pain, which is typical of his sickle cell crisis.  HISTORY:  It has been going on for past 3 days.  He has history of sickle cell anemia.  He also has nausea and generalized malaise and not feeling well, but main is the back pain and the flank pain that is bothering him.  He denies any shortness of breath or fevers or chills or chest pain per se.  Otherwise review of systems is negative 10 systems reviewed.  Past medical history is significant for sickle cell disease.  He has been evaluated for this at [facility] couple of days ago and returns again because it is not improving.  He has had multiple admissions and visits to emergency department.  Per records, he has had at least 24 visits to ER since 2012 beginning.  PAST MEDICAL HISTORY: 1. Auto-infarct of the spleen. 2. Cholecystectomy. 3. Umbilical hernia repair. 4. Anxiety. 5. Chronic leukocytosis. 6. 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 7. History of PE 4 years ago, status post Coumadin therapy. 8. Hemoglobin SS sickle cell disease.  SOCIAL HISTORY:  The patient has a past history of former alcohol abuse and tobacco abuse, but denies currently.  States his last alcoholic drink was about 3 months ago.  FAMILY HISTORY:  Significant for diabetes and hypertension and sickle cell disease.  ALLERGIES:  To MORPHINE.  MEDICATIONS:  He states that he takes: 1. Dilaudid 4 mg twice a day. 2. OxyContin 40 mg twice a day. 3. Ibuprofen 800 mg t.i.d. as  needed for pain. 4. Ambien 10 mg a daily at night. 5. Hydroxyurea 500 mg b.i.d. 6. He was also supposed to be taking potassium 20 mEq daily, but he     did not endorse taking this in the past. 7. He also was taking Zyprexa 5 mg q.h.s., also did not endorse taking     it currently.  PHYSICAL EXAMINATION:  VITAL SIGNS:  Temperature 98.5, blood pressure 145/89, pulse 74, respirations 20, satting 100% on room air. GENERAL:  The patient appears to be comfortable, in no acute distress. HEENT:  Head nontraumatic.  Moist mucous membranes. LUNGS:  Clear to auscultation bilaterally. HEART:  Regular rate and rhythm.  No murmurs appreciated. ABDOMEN:  Soft, nontender, nondistended. EXTREMITIES:  Without clubbing, cyanosis, or edema. NEUROLOGIC:  Grossly intact.  Strength 5/5 in all 4 extremities. SKIN:  No rashes or skin lesions noted.  LABORATORY DATA:  White blood cell count 20.7, hemoglobin 8.5.  Sodium 141, potassium 2.7, creatinine 0.7.  Chest x-ray from 2 days ago is unremarkable.  ASSESSMENT/PLAN:  This is a 30 year old gentleman with history of frequent admissions for sickle cell crisis, comes in and now  admission. 1. Sickle cell disease with sickle cell crisis.  We will admit per     sickle cell protocol.  We will continue his hydroxyurea.  Give     aggressive IV fluid resuscitation.  We will obtain CBC, retic     count, and check his CMET to see what his bilirubin is at.  Check     his magnesium level given his hypokalemia.  Control his pain with     Dilaudid as needed.  Continue his OxyContin. 2. Hypokalemia.  We will replace p.o. and IV and monitor him on     telemetry.  Check his magnesium level.  The plan is to him on long-     term potassium replacement. 3. Prophylaxis.  Protonix and Lovenox.  I have spent a total of 50 minutes on this admission.     Michiel Cowboy, MD     AVD/MEDQ  D:  07/09/2010  T:  07/09/2010  Job:  [id number]  cc:   Dr. [name]  Dr.  [name]  Electronically Signed by Therisa Doyne MD on 07/11/2010 02:45:49 AM

## 2010-07-12 LAB — CBC
HCT: 25 % — ABNORMAL LOW (ref 39.0–52.0)
MCH: 30.6 pg (ref 26.0–34.0)
MCV: 89.9 fL (ref 78.0–100.0)
Platelets: 518 10*3/uL — ABNORMAL HIGH (ref 150–400)
RBC: 2.78 MIL/uL — ABNORMAL LOW (ref 4.22–5.81)
WBC: 23.8 10*3/uL — ABNORMAL HIGH (ref 4.0–10.5)

## 2010-07-12 LAB — COMPREHENSIVE METABOLIC PANEL
ALT: 17 U/L (ref 0–53)
AST: 22 U/L (ref 0–37)
Alkaline Phosphatase: 95 U/L (ref 39–117)
CO2: 22 mEq/L (ref 19–32)
Calcium: 9.5 mg/dL (ref 8.4–10.5)
Chloride: 104 mEq/L (ref 96–112)
Glucose, Bld: 116 mg/dL — ABNORMAL HIGH (ref 70–99)
Potassium: 3.7 mEq/L (ref 3.5–5.1)
Sodium: 137 mEq/L (ref 135–145)
Total Bilirubin: 4 mg/dL — ABNORMAL HIGH (ref 0.3–1.2)

## 2010-07-12 LAB — CROSSMATCH
ABO/RH(D): O POS
Donor AG Type: NEGATIVE
Unit division: 0

## 2010-07-12 LAB — RETICULOCYTES: Retic Count, Absolute: 300.2 10*3/uL — ABNORMAL HIGH (ref 19.0–186.0)

## 2010-07-13 LAB — BASIC METABOLIC PANEL
Calcium: 9.6 mg/dL (ref 8.4–10.5)
Glucose, Bld: 94 mg/dL (ref 70–99)
Potassium: 4.1 mEq/L (ref 3.5–5.1)
Sodium: 134 mEq/L — ABNORMAL LOW (ref 135–145)

## 2010-07-13 LAB — CBC
Hemoglobin: 8.8 g/dL — ABNORMAL LOW (ref 13.0–17.0)
MCH: 30.8 pg (ref 26.0–34.0)
MCHC: 34.2 g/dL (ref 30.0–36.0)
Platelets: 517 10*3/uL — ABNORMAL HIGH (ref 150–400)
RDW: 18.7 % — ABNORMAL HIGH (ref 11.5–15.5)

## 2010-07-14 LAB — CBC
Hemoglobin: 8.2 g/dL — ABNORMAL LOW (ref 13.0–17.0)
MCH: 30.8 pg (ref 26.0–34.0)
Platelets: 502 10*3/uL — ABNORMAL HIGH (ref 150–400)
RBC: 2.66 MIL/uL — ABNORMAL LOW (ref 4.22–5.81)

## 2010-07-14 LAB — RETICULOCYTES
RBC.: 2.66 MIL/uL — ABNORMAL LOW (ref 4.22–5.81)
Retic Ct Pct: 7.1 % — ABNORMAL HIGH (ref 0.4–3.1)

## 2010-07-14 LAB — COMPREHENSIVE METABOLIC PANEL
Albumin: 3.8 g/dL (ref 3.5–5.2)
BUN: 8 mg/dL (ref 6–23)
Calcium: 9.4 mg/dL (ref 8.4–10.5)
GFR calc Af Amer: >60 mL/min (ref >60)
Glucose, Bld: 109 mg/dL — ABNORMAL HIGH (ref 70–99)
Sodium: 139 mEq/L (ref 135–145)
Total Protein: 7.4 g/dL (ref 6.0–8.3)

## 2010-07-14 LAB — CULTURE, RESPIRATORY W GRAM STAIN

## 2010-07-14 NOTE — Group Therapy Note (Signed)
NAMEALFONSE, [name]             ACCOUNT NO.:  [id number]  MEDICAL RECORD NO.:  [id number]  LOCATION:  1343                         FACILITY:  [facility]  PHYSICIAN:  [name], M.D.   DATE OF BIRTH:  03-15-1980                                PROGRESS NOTE   PRIMARY CARE PHYSICIAN: Lorelle Formosa, M.D.  CURRENT DIAGNOSES: 1. Sickle cell anemia. 2. Vasoocclusive crisis. 3. Acute bronchitis. 4. Hypokalemia, repleted. 5. Bacteriuria, urine cultures negative. 6. Chronic leukocytosis 7. Chronic thrombocytosis. 8. History of pulmonary embolism.  DISCHARGE MEDICATIONS: Will be dictated at time of actual discharge.  CONSULTATION: None.  BRIEF ADMISSION HISTORY OF PRESENT ILLNESS: The patient is a 30 year old male with past medical history of sickle cell anemia and recurrent admissions for vasoocclusive crisis who presented to the hospital with a chief complaint of flank pain, back pain, and rib pain which is typical of his usual crises.  The patient's pain could not adequately be controlled in the emergency department and he subsequently was referred to the hospitalist service for further evaluation and treatment.  For full details, please see the dictated report done by Dr. Adela Glimpse.  PROCEDURES AND DIAGNOSTIC STUDIES: 1. Chest x-ray on July 09, 2010, showed mild cardiac enlargement with     no evidence of active pulmonary disease. 2. Repeat chest x-ray status post placement of a peripherally inserted     central catheter showed the tip in satisfactory position within the     cavoatrial junction.  DISCHARGE LABORATORY VALUES: Will be dictated at time of actual discharge.  HOSPITAL COURSE BY PROBLEM: 1. Sickle cell anemia with vasoocclusive crisis:  The patient was     admitted and put on high flow IV fluids x24 hours.  He was given an     exchange transfusion x2 and his IV fluids have continued but at a     lower rate of infusion.  He has required IV narcotics for  ongoing     pain control.  His Hydrea and folic acid have been given throughout     his hospital stay but he is continuing to require IV narcotics for     pain control which is the barrier for discharge at this point.     Plan is to discharge him home once the pain is adequately     controlled. 2. Hyperbilirubinemia:  This is secondary to hemolysis. 3. Acute bronchitis:  The patient complained of a productive cough     with yellow sputum.  A culture was sent and grew mixed flora.  The     patient was put on azithromycin and has completed 3 days out of a     planned course of 5 days of therapy with improvement in his     symptoms.  He is also put on Mucinex. 4. Hypokalemia:  The patient was put on both potassium and magnesium     supplementation and at this point he is maintaining his potassium     levels. 5. Bacteriuria:  A urine culture was sent but was negative for urinary     tract infection. 6. Chronic leukocytosis/chronic thrombocytosis:  Stable. 7. History of pulmonary embolism:  The patient was  put on prophylactic     doses of Lovenox while in the hospital.  DISPOSITION: The patient is not yet medically stable for discharge due to ongoing need for IV pain medications.  A discharge summary addendum will be dictated at time of actual discharge.     [name], M.D.     CR/MEDQ  D:  07/13/2010  T:  07/13/2010  Job:  [id number]  cc:   Lorelle Formosa, M.D. Fax: [phone]  Electronically Signed by [name] M.D. on 07/14/2010 12:44:16 PM

## 2010-07-16 LAB — CBC
HCT: 24.5 % — ABNORMAL LOW (ref 39.0–52.0)
Hemoglobin: 8.1 g/dL — ABNORMAL LOW (ref 13.0–17.0)
MCV: 91.1 fL (ref 78.0–100.0)
RBC: 2.69 MIL/uL — ABNORMAL LOW (ref 4.22–5.81)
WBC: 17.3 10*3/uL — ABNORMAL HIGH (ref 4.0–10.5)

## 2010-07-16 LAB — DIFFERENTIAL
Lymphocytes Relative: 18 % (ref 12–46)
Lymphs Abs: 3.1 10*3/uL (ref 0.7–4.0)
Monocytes Relative: 12 % (ref 3–12)
Neutrophils Relative %: 65 % (ref 43–77)

## 2010-07-16 LAB — RETICULOCYTES: Retic Count, Absolute: 83.4 10*3/uL (ref 19.0–186.0)

## 2010-07-17 LAB — COMPREHENSIVE METABOLIC PANEL
ALT: 47 U/L (ref 0–53)
Calcium: 9.6 mg/dL (ref 8.4–10.5)
GFR calc Af Amer: >60 mL/min (ref >60)
Glucose, Bld: 102 mg/dL — ABNORMAL HIGH (ref 70–99)
Sodium: 137 mEq/L (ref 135–145)
Total Protein: 8.5 g/dL — ABNORMAL HIGH (ref 6.0–8.3)

## 2010-07-17 LAB — DIFFERENTIAL
Eosinophils Relative: 5 % (ref 0–5)
Lymphocytes Relative: 24 % (ref 12–46)
Monocytes Absolute: 2.3 10*3/uL — ABNORMAL HIGH (ref 0.1–1.0)
Monocytes Relative: 15 % — ABNORMAL HIGH (ref 3–12)
Neutrophils Relative %: 54 % (ref 43–77)

## 2010-07-17 LAB — CBC
MCH: 30.3 pg (ref 26.0–34.0)
Platelets: 467 10*3/uL — ABNORMAL HIGH (ref 150–400)
RBC: 2.51 MIL/uL — ABNORMAL LOW (ref 4.22–5.81)
WBC: 15.1 10*3/uL — ABNORMAL HIGH (ref 4.0–10.5)

## 2010-07-17 LAB — PREPARE RBC (CROSSMATCH)

## 2010-07-17 LAB — RETICULOCYTES: Retic Ct Pct: 2.7 % (ref 0.4–3.1)

## 2010-07-18 LAB — CROSSMATCH
Antibody Screen: NEGATIVE
Unit division: 0

## 2010-07-18 LAB — CBC
Hemoglobin: 8.6 g/dL — ABNORMAL LOW (ref 13.0–17.0)
MCH: 30.3 pg (ref 26.0–34.0)
MCV: 89.8 fL (ref 78.0–100.0)
RBC: 2.84 MIL/uL — ABNORMAL LOW (ref 4.22–5.81)

## 2010-07-19 LAB — DIFFERENTIAL
Basophils Relative: 2 % — ABNORMAL HIGH (ref 0–1)
Eosinophils Relative: 6 % — ABNORMAL HIGH (ref 0–5)
Lymphs Abs: 3.3 10*3/uL (ref 0.7–4.0)
Monocytes Absolute: 1.8 10*3/uL — ABNORMAL HIGH (ref 0.1–1.0)

## 2010-07-19 LAB — CBC
Hemoglobin: 8.5 g/dL — ABNORMAL LOW (ref 13.0–17.0)
MCH: 29.7 pg (ref 26.0–34.0)
MCV: 90.9 fL (ref 78.0–100.0)
RBC: 2.86 MIL/uL — ABNORMAL LOW (ref 4.22–5.81)

## 2010-07-19 LAB — RETICULOCYTES: Retic Count, Absolute: 71.5 10*3/uL (ref 19.0–186.0)

## 2010-07-20 LAB — CBC
HCT: 25.3 % — ABNORMAL LOW (ref 39.0–52.0)
Hemoglobin: 8.2 g/dL — ABNORMAL LOW (ref 13.0–17.0)
MCV: 91.7 fL (ref 78.0–100.0)
RDW: 17 % — ABNORMAL HIGH (ref 11.5–15.5)
WBC: 12.5 10*3/uL — ABNORMAL HIGH (ref 4.0–10.5)

## 2010-07-20 LAB — BASIC METABOLIC PANEL
CO2: 26 mEq/L (ref 19–32)
Chloride: 103 mEq/L (ref 96–112)
Creatinine, Ser: 0.54 mg/dL (ref 0.50–1.35)
GFR calc Af Amer: >60 mL/min (ref >60)
Potassium: 4.4 mEq/L (ref 3.5–5.1)
Sodium: 136 mEq/L (ref 135–145)

## 2010-07-20 LAB — RETICULOCYTES
RBC.: 2.76 MIL/uL — ABNORMAL LOW (ref 4.22–5.81)
Retic Count, Absolute: 60.7 10*3/uL (ref 19.0–186.0)

## 2010-07-21 LAB — CBC
HCT: 24.6 % — ABNORMAL LOW (ref 39.0–52.0)
Hemoglobin: 8 g/dL — ABNORMAL LOW (ref 13.0–17.0)
MCH: 30.2 pg (ref 26.0–34.0)
MCV: 92.8 fL (ref 78.0–100.0)
Platelets: 492 10*3/uL — ABNORMAL HIGH (ref 150–400)
RBC: 2.65 MIL/uL — ABNORMAL LOW (ref 4.22–5.81)
WBC: 12.2 10*3/uL — ABNORMAL HIGH (ref 4.0–10.5)

## 2010-07-21 LAB — DIFFERENTIAL
Eosinophils Absolute: 1.1 10*3/uL — ABNORMAL HIGH (ref 0.0–0.7)
Lymphocytes Relative: 26 % (ref 12–46)
Lymphs Abs: 3.2 10*3/uL (ref 0.7–4.0)
Monocytes Relative: 15 % — ABNORMAL HIGH (ref 3–12)
Neutro Abs: 5.9 10*3/uL (ref 1.7–7.7)
Neutrophils Relative %: 48 % (ref 43–77)

## 2010-07-21 LAB — RETICULOCYTES: Retic Count, Absolute: 45.1 10*3/uL (ref 19.0–186.0)

## 2010-08-01 NOTE — Discharge Summary (Signed)
[name], [name]             ACCOUNT NO.:  [id number]  MEDICAL RECORD NO.:  [id number]  LOCATION:  1320                         FACILITY:  [facility]  PHYSICIAN:  Nghia Mcentee L. [name], M.D.     DATE OF BIRTH:  May 16, 1980  DATE OF ADMISSION:  05/31/2010 DATE OF DISCHARGE:  06/11/2010                              DISCHARGE SUMMARY   FINAL DIAGNOSES: 1. Sickle cell crisis. 2. Avascular necrosis of the left shoulder. 3. Hypokalemia, resolved. 4. Leukocytosis secondary to #1, improved. 5. Anxiety disorder, stable.  OPERATIONS AND PROCEDURES:  Transfusion of 2 units of packed RBCs.  HISTORY OF PRESENT ILLNESS:  [name] is a 30 year old single black male with known sickle cell disease with previous multiple admissions for crisis.  The patient states he began to develop severe pain in his ribs and low back one day prior to admission.  He attempted to control his pain with p.o. Dilaudid but was unsuccessful.  He came to the emergency room and for which time he was treated initially for approximately 6 hours.  During this time, the pain improved but quickly returned.  The patient was subsequently admitted to the hospital by the hospitalist team for further therapy.  PAST MEDICAL HISTORY AND PHYSICAL EXAM:  Per admission H and P.  HOSPITAL COURSE:  The patient was admitted to the hospitalist service for treatment of his sickle cell crisis.  He was also noted to be hypokalemic at the time of admission as well.  His potassium was low at 3.0.  He notably had a white count of 23,000 with hemoglobin 8.1.  The patient was started on IV fluids initially.  He was also placed on IV Dilaudid for control of his pain.  This however was not successful initially.  He was subsequent transferred to my service on Jun 01, 2010. A PICC line was placed and he had poor access as well.  Over the subsequent days he was initially tried on scheduled Dilaudid q.2 h.  This however was not successful.  He  was subsequently switched to customized PCA Dilaudid pump.  This eventually did manage his to control his pain.  His IV fluids were adjusted as well.  He was noted to have problems with sinus congestion with secretions.  He was placed on Rocephin empirically as well.  With continued supportive measures, he did make slow but steady progress.  He did require total of two transfusions during that time which did help him overall improve.  The patient continued to have intermittent bouts of left shoulder pain. He had previously been known to have early avascular necrosis.  He was started on ibuprofen for this which helped to stabilize his pain.  A follow-up x-ray of the shoulder still showed changes consistent with avascular necrosis with otherwise no other new findings.  Eventually with supportive measures, the patient did stabilize to the point of being able to manage his pain at home.  He was subsequently discharged home improved on Jun 11, 2010.  DISCHARGE MEDICATIONS:  Medications at the time of discharge consisted of: 1. Benadryl 25 mg q.8 h p.r.n. 2. Famotidine 20 mg daily. 3. Folic acid 1 mg daily 4. Metoprolol 25 mg  b.i.d. 5. Zyprexa 5 mg q.h.s. 6. Promethazine 25 mg q.6 h p.r.n. 7. Ambien 10 mg q.h.s. 8. Dilaudid 4 mg q.3 h p.r.n. pain 9. Hydroxyurea 500 mg b.i.d. 10.Ibuprofen 800 mg q.8 h 11.Klor-Con 20 mEq daily 12.OxyContin 40 mg b.i.d.  He will be maintained on a regular diet.  He is to follow up with Dr. [name] in two weeks' time.          ______________________________ Lind Guest [name], M.D.     ELD/MEDQ  D:  08/01/2010  T:  08/01/2010  Job:  [id number]  Electronically Signed by Willey Blade M.D. on 08/01/2010 08:15:25 PM

## 2010-08-15 ENCOUNTER — Emergency Department (HOSPITAL_COMMUNITY)
Admission: EM | Admit: 2010-08-15 | Discharge: 2010-08-15 | Disposition: A | Discharge status: Home / Self Care | Payer: Medicare Other | Attending: Emergency Medicine | Admitting: Emergency Medicine

## 2010-08-15 DIAGNOSIS — L819 Disorder of pigmentation, unspecified: Secondary | ICD-10-CM | POA: Insufficient documentation

## 2010-08-15 DIAGNOSIS — R109 Unspecified abdominal pain: Secondary | ICD-10-CM | POA: Insufficient documentation

## 2010-08-15 DIAGNOSIS — D5700 Hb-SS disease with crisis, unspecified: Secondary | ICD-10-CM | POA: Insufficient documentation

## 2010-08-15 LAB — POCT I-STAT, CHEM 8
Calcium, Ion: 1.23 mmol/L (ref 1.12–1.32)
Chloride: 107 mEq/L (ref 96–112)
HCT: 32 % — ABNORMAL LOW (ref 39.0–52.0)
Sodium: 142 mEq/L (ref 135–145)
TCO2: 20 mmol/L (ref 0–100)

## 2010-08-15 LAB — RETICULOCYTES
RBC.: 3.26 MIL/uL — ABNORMAL LOW (ref 4.22–5.81)
Retic Count, Absolute: 286.9 10*3/uL — ABNORMAL HIGH (ref 19.0–186.0)

## 2010-08-15 LAB — CBC
HCT: 29.8 % — ABNORMAL LOW (ref 39.0–52.0)
MCV: 91.4 fL (ref 78.0–100.0)
RBC: 3.26 MIL/uL — ABNORMAL LOW (ref 4.22–5.81)
RDW: 16.5 % — ABNORMAL HIGH (ref 11.5–15.5)
WBC: 21.5 10*3/uL — ABNORMAL HIGH (ref 4.0–10.5)

## 2010-08-15 LAB — URINALYSIS, ROUTINE W REFLEX MICROSCOPIC
Ketones, ur: NEGATIVE mg/dL
Leukocytes, UA: NEGATIVE
Nitrite: NEGATIVE
Protein, ur: 30 mg/dL — AB
pH: 6.5 (ref 5.0–8.0)

## 2010-08-15 LAB — URINE MICROSCOPIC-ADD ON

## 2010-08-17 IMAGING — CR DG CHEST 2V
2 series · 2 of 2 positions shown · non-contrast
Comparison: 08/18/2007.

CLINICAL DATA: Bilateral chest pain.  Sickle cell disease.

CHEST - 2 VIEW

[w chest pa]
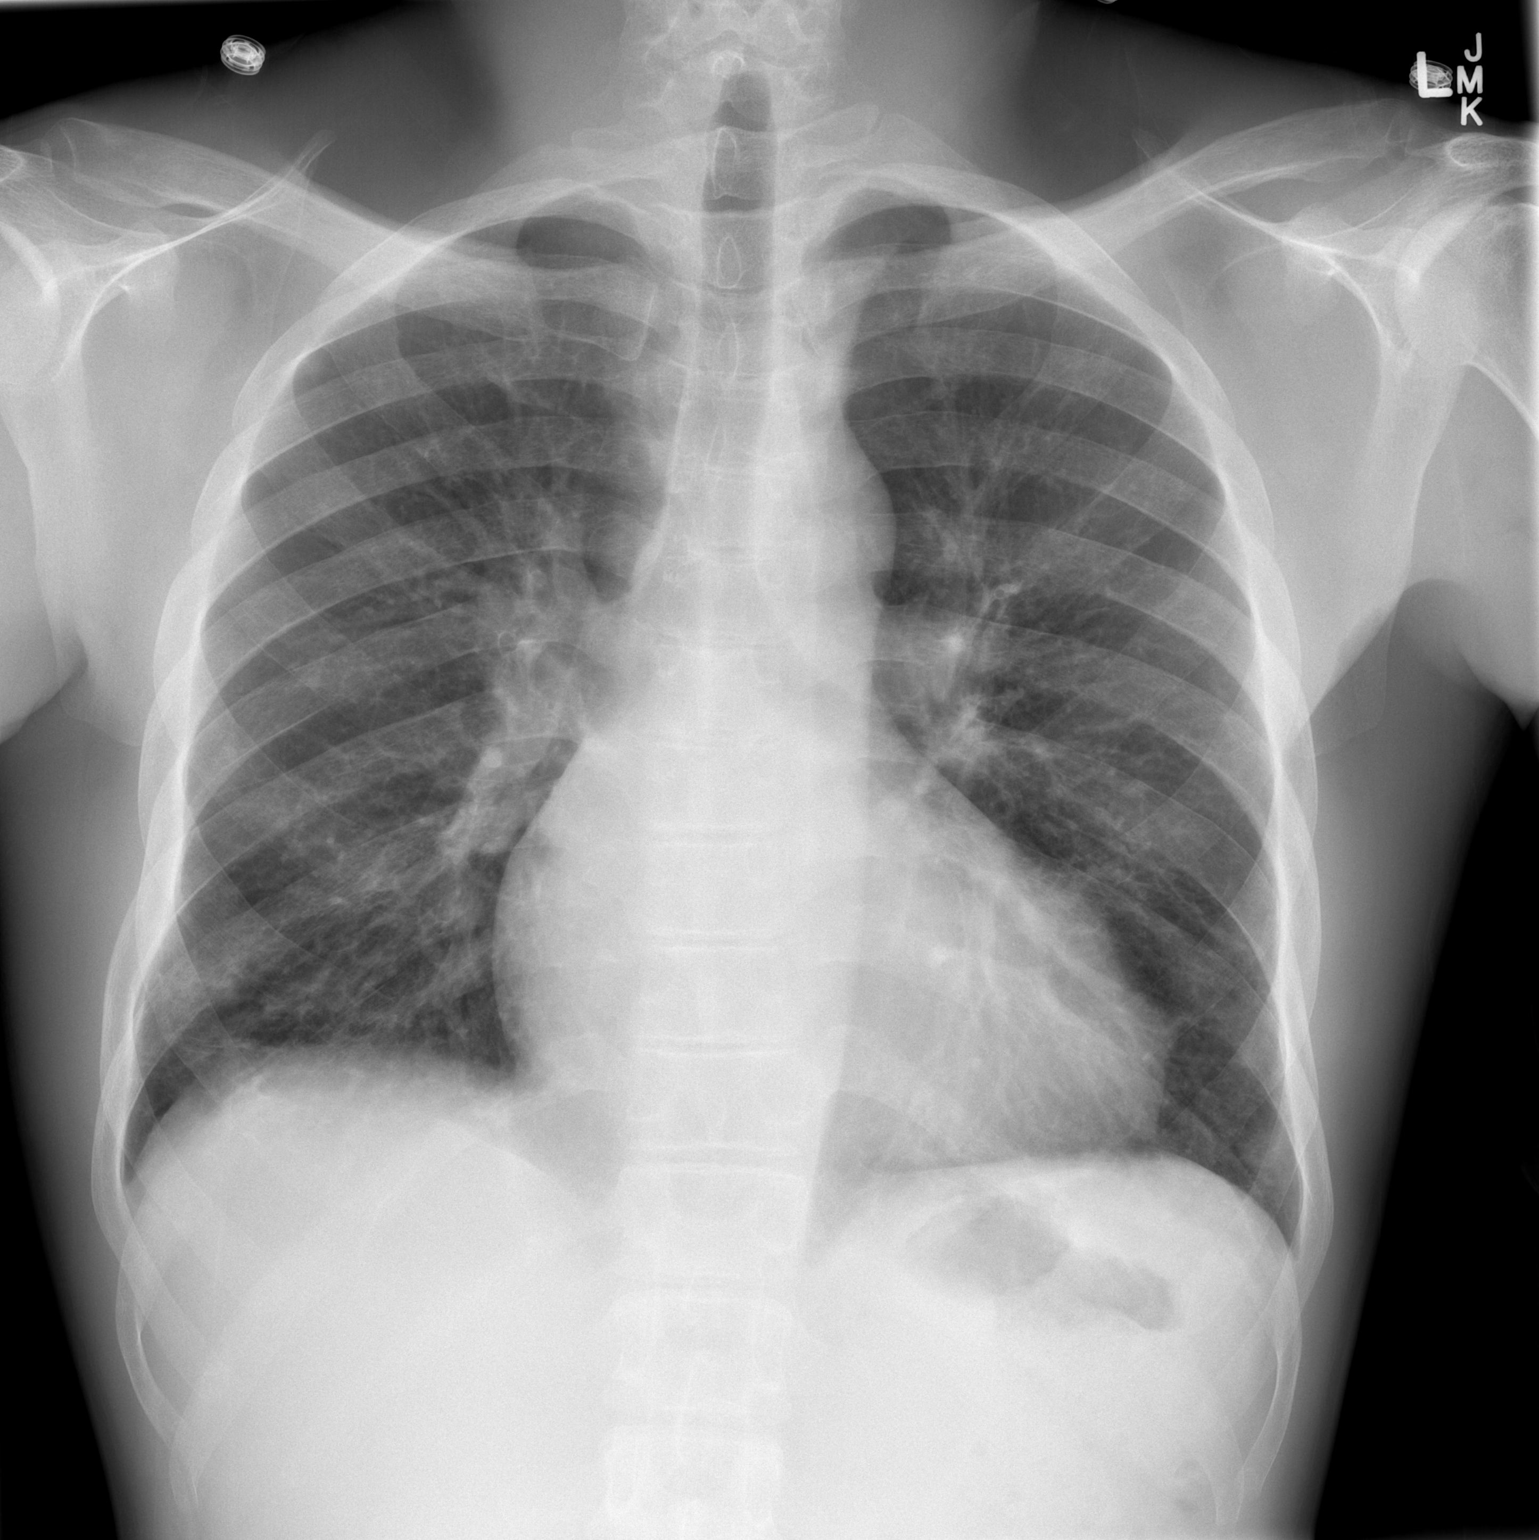

[w chest lat]
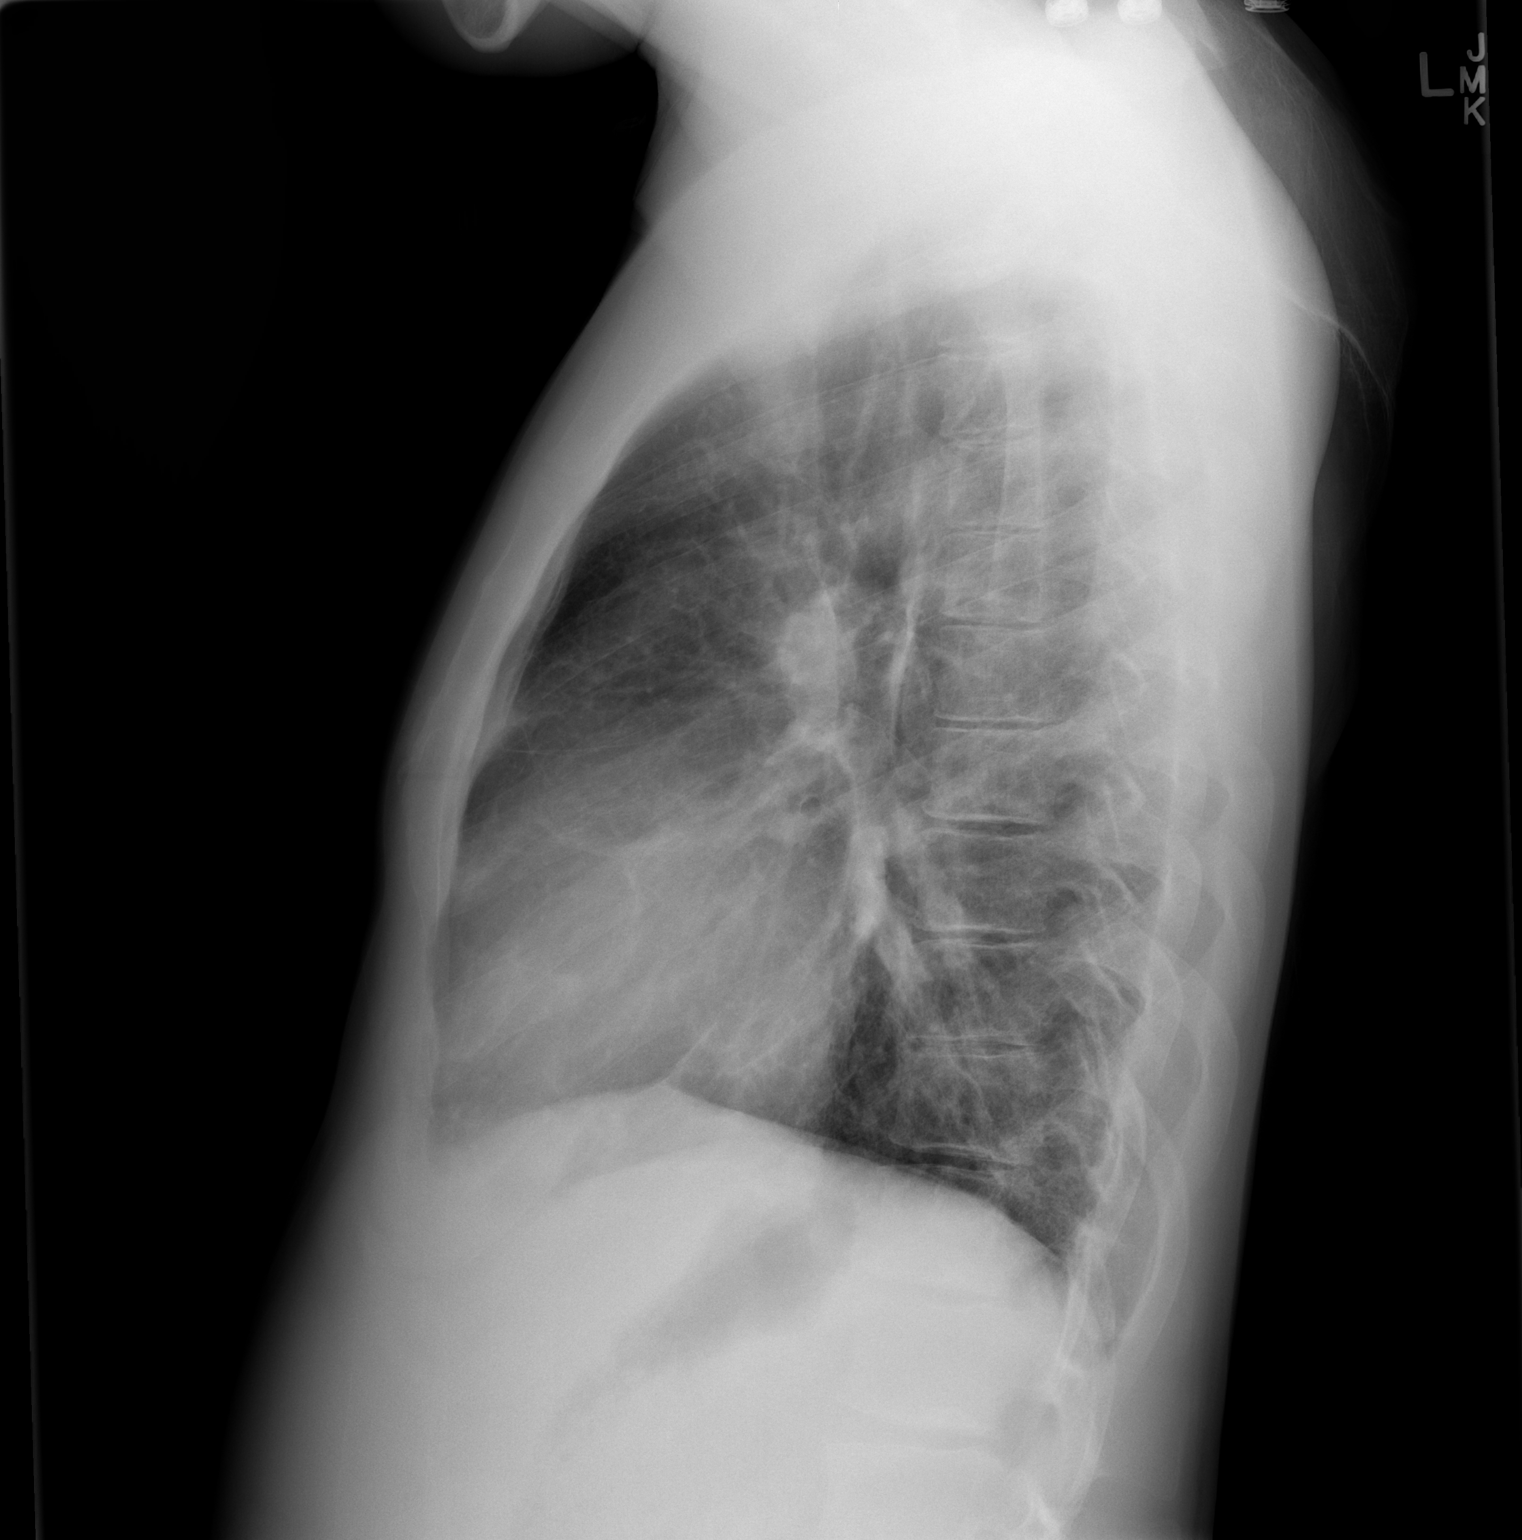

[2 of 2 positions shown; findings below may reference images not displayed]

FINDINGS: Stable enlarged cardiac silhouette.  Decreased density at
the right lung base.  Stable mild diffuse peribronchial thickening.
Mild scoliosis.
IMPRESSION: 1.  Decreased right basilar density with some residual scarring.
2.  Stable cardiomegaly and chronic bronchitic changes.

## 2010-08-19 NOTE — Discharge Summary (Signed)
NAMEBING, DUFFEY             ACCOUNT NO.:  [id number]  MEDICAL RECORD NO.:  [id number]  LOCATION:  1343                         FACILITY:  [facility]  PHYSICIAN:  Altha Harm, MDDATE OF BIRTH:  17-Dec-1980  DATE OF ADMISSION:  07/08/2010 DATE OF DISCHARGE:                              DISCHARGE SUMMARY   DATE OF DISCHARGE ANTICIPATED:  July 21, 2010  DISCHARGE DISPOSITION:  Home.  FINAL DISCHARGE DIAGNOSES: 1. Hemoglobin SS with vasoocclusive episode. 2. Pain crisis associated with vasoocclusive episode. 3. Acute bronchitis, will be treated. 4. Avascular necrosis, left shoulder. 5. Anemia associated with hemoglobin SS, requiring multiple     transfusions this hospitalization. 6. Hyperbilirubinemia associated with red cell destruction 7. Chronic narcotic use associated with chronic pain associated with     sickle cell.  DISCHARGE MEDICATIONS:  Discharge medications include the following: 1. Oxycodone SR 60 mg p.o. daily q.8 h.  The patient to wean down to     home dose of 40 mg p.o. q.12 h in 48 hrs as pain improves. 2. Senokot 1 to 2 tablets p.o. q.h.s. p.r.n. constipation. 3. Ambien 10 mg p.o. q.h.s. 4. Folic acid 1 mg p.o. daily. 5. Hydromorphone 4 mg p.o. q.4 h p.r.n. pain. 6. Hydroxyurea 500 mg p.o. b.i.d. 7. Ibuprofen 800 mg p.o. q.8 h p.r.n. pain. 8. Metoprolol 25 mg p.o. b.i.d..  CONSULTANTS:  None.  PROCEDURES: 1. The patient had one exchange transfusion of 300 cc and straight     transfusions of 3 units of packed red blood cells. 2. PICC line placement.  DIAGNOSTIC STUDIES:  Portable chest x-ray, two-view, done on July 09, 2010, which shows mild cardiac enlargement.  No evidence of active pulmonary disease.  PERTINENT LABORATORY STUDIES:  Urine culture negative.  Respiratory culture which shows few gram-positive rods and finally read as normal oropharyngeal flora.  At the time of discharge, CBC as follows:  WBC 12.5, hemoglobin 8.2, hematocrit  25.3, platelet count 497.  Reticulocyte percent 2.2, reticulocyte reduction index 0.65.  PRIMARY CARE PHYSICIAN:  Lorelle Formosa, M.D.  CODE STATUS:  Full code.  ALLERGIES:  No real allergies; however, the patient has hives and itching in response to both morphine sulfate and hydromorphone.  CHIEF COMPLAINT:  Flank pain, back pain which is typical for sickle cell vasoocclusive episode.  HISTORY OF PRESENT ILLNESS:  Please refer to the H and P by Dr. Adela Glimpse for details of the HPI.  However, in short, this is a patient with hemoglobin SS who presents to the emergency room with complaints of pain in the flank area and back occurring for 3 days.  The patient had his usual pain medications at home, however, got very little relief from this pain.  HOSPITAL COURSE: 1. Hemoglobin SS with vasoocclusive crisis leading to pain and anemia.     The patient received oral intermittent IV pain medication on     arrival here to the emergency room.  The patient continued with     this regimen while he received an exchange transfusion and one     transfusion of 1 unit of packed blood cells.  The patient had very     little relief from the pain  and this was transitioned over to [facility] [place] in     addition to his long-acting medications.  With very relief from     that regimen, the long-acting medications were increased initially     from 40 mg to 60 mg b.i.d. and then up to 60 mg t.i.d.  With this     regimen, the patient had significant relief.  Upon arrival, the     patient had a pain level of 6 to 8; at the time of discharge, the     patient rated pain at a level of 3 to 4.  The patient usually has a     pain level of 2 to 3 at home.  The patient has been given detailed       instructions on how to wean back down to his 40 mg b.i.d. as the pain      improved.  Follow up  with Dr. [name] for further treatment of his      sickle cell. 2. Acute bronchitis.  The patient had clinical signs and symptoms  of     bronchitis.  Sputum culture was sent and the patient was started on     a azithromycin particularly in light of the fact that he was     immunocompromised today and the risk of acute chest syndrome.  The     patient completed his course of azithromycin and has been without     any fever or cough since. 3. Hypertension.  The patient is on metoprolol and is continued on     that without any event.  Otherwise the patient has remained stable in the hospital.  At the time of discharge, the patient is stable.  The patient is expected to leave tomorrow as long as there is no change in his clinical condition.  At that time, I will defer to the discharging physician to document his clinical examination.  DIETARY RESTRICTIONS:  None.  PHYSICAL RESTRICTIONS:  Activity as tolerated.  FOLLOWUP:  The patient is to follow up with his primary care physician Dr. [name] within 3 to 5 days.  Total time spent on this discharge process, 37  minutes.     Altha Harm, MD     MAM/MEDQ  D:  07/20/2010  T:  07/20/2010  Job:  [id number]  cc:   Lorelle Formosa, M.D. Fax: [phone]  [name] [name], M.D. [address] [address] [place] [address]  Electronically Signed by Marthann Schiller MD on 08/19/2010 12:13:46 PM

## 2010-09-08 ENCOUNTER — Emergency Department (HOSPITAL_COMMUNITY): Payer: Medicare Other

## 2010-09-08 ENCOUNTER — Inpatient Hospital Stay (HOSPITAL_COMMUNITY)
Admission: EM | Admit: 2010-09-08 | Discharge: 2010-09-14 | DRG: 812 | Disposition: A | Discharge status: Home / Self Care | Payer: Medicare Other | Attending: Internal Medicine | Admitting: Internal Medicine

## 2010-09-08 DIAGNOSIS — D5700 Hb-SS disease with crisis, unspecified: Principal | ICD-10-CM | POA: Diagnosis present

## 2010-09-08 DIAGNOSIS — E876 Hypokalemia: Secondary | ICD-10-CM | POA: Diagnosis present

## 2010-09-08 DIAGNOSIS — Z86718 Personal history of other venous thrombosis and embolism: Secondary | ICD-10-CM

## 2010-09-08 DIAGNOSIS — D72829 Elevated white blood cell count, unspecified: Secondary | ICD-10-CM | POA: Diagnosis present

## 2010-09-08 DIAGNOSIS — Z79899 Other long term (current) drug therapy: Secondary | ICD-10-CM

## 2010-09-08 DIAGNOSIS — D473 Essential (hemorrhagic) thrombocythemia: Secondary | ICD-10-CM | POA: Diagnosis present

## 2010-09-08 DIAGNOSIS — M87059 Idiopathic aseptic necrosis of unspecified femur: Secondary | ICD-10-CM | POA: Diagnosis present

## 2010-09-08 DIAGNOSIS — M940 Chondrocostal junction syndrome [Tietze]: Secondary | ICD-10-CM | POA: Diagnosis present

## 2010-09-08 DIAGNOSIS — M87029 Idiopathic aseptic necrosis of unspecified humerus: Secondary | ICD-10-CM | POA: Diagnosis present

## 2010-09-08 DIAGNOSIS — F411 Generalized anxiety disorder: Secondary | ICD-10-CM | POA: Diagnosis present

## 2010-09-08 LAB — URINALYSIS, ROUTINE W REFLEX MICROSCOPIC
Glucose, UA: NEGATIVE mg/dL
Ketones, ur: NEGATIVE mg/dL
Leukocytes, UA: NEGATIVE
Protein, ur: NEGATIVE mg/dL

## 2010-09-08 LAB — COMPREHENSIVE METABOLIC PANEL
Albumin: 4.2 g/dL (ref 3.5–5.2)
Alkaline Phosphatase: 100 U/L (ref 39–117)
BUN: 5 mg/dL — ABNORMAL LOW (ref 6–23)
CO2: 22 mEq/L (ref 19–32)
Chloride: 107 mEq/L (ref 96–112)
Glucose, Bld: 108 mg/dL — ABNORMAL HIGH (ref 70–99)
Potassium: 3.1 mEq/L — ABNORMAL LOW (ref 3.5–5.1)
Total Bilirubin: 3.8 mg/dL — ABNORMAL HIGH (ref 0.3–1.2)

## 2010-09-08 LAB — CBC
HCT: 21.2 % — ABNORMAL LOW (ref 39.0–52.0)
HCT: 20.3 % — ABNORMAL LOW (ref 39.0–52.0)
Hemoglobin: 7.1 g/dL — ABNORMAL LOW (ref 13.0–17.0)
Hemoglobin: 6.7 g/dL — CL (ref 13.0–17.0)
MCH: 29.8 pg (ref 26.0–34.0)
MCHC: 33.5 g/dL (ref 30.0–36.0)
MCV: 89.1 fL (ref 78.0–100.0)
MCV: 89.8 fL (ref 78.0–100.0)
RBC: 2.26 MIL/uL — ABNORMAL LOW (ref 4.22–5.81)
WBC: 17.4 10*3/uL — ABNORMAL HIGH (ref 4.0–10.5)

## 2010-09-08 LAB — URINE MICROSCOPIC-ADD ON

## 2010-09-08 LAB — DIFFERENTIAL
Lymphocytes Relative: 12 % (ref 12–46)
Lymphs Abs: 2 10*3/uL (ref 0.7–4.0)
Monocytes Absolute: 1.9 10*3/uL — ABNORMAL HIGH (ref 0.1–1.0)
Monocytes Relative: 12 % (ref 3–12)
Neutro Abs: 11.8 10*3/uL — ABNORMAL HIGH (ref 1.7–7.7)

## 2010-09-08 LAB — RETICULOCYTES: RBC.: 2.38 MIL/uL — ABNORMAL LOW (ref 4.22–5.81)

## 2010-09-08 NOTE — H&P (Signed)
NAMEELIEZER, [name]             ACCOUNT NO.:  [id number]  MEDICAL RECORD NO.:  [id number]  LOCATION:  WLED                         FACILITY:  [facility]  PHYSICIAN:  Kathlen Mody, MD       DATE OF BIRTH:  06/02/1980  DATE OF ADMISSION:  09/08/2010 DATE OF DISCHARGE:                             HISTORY & PHYSICAL   PRIMARY CARE PHYSICIAN:  Dr. [name].  HEMATOLOGIST:  Dr. Willey Blade.  CHIEF COMPLAINT:  Bilateral chest pain since this morning.  HISTORY OF PRESENT ILLNESS:  This is a 30 year old gentleman with history of known sickle cell disease, history of previous admissions for sickle cell painful crisis, came in this morning for a complain of severe pain in his lower ribs and chest, started this morning.  Also, complains of bilateral lower extremity pain, not relieved with his oral pain medications.  The patient denies any shortness of breath, fever, or any upper respiratory symptoms.  He denies any nausea, vomiting, or abdominal pain.  Denies any diarrhea or urinary complaints.  He denies any hemoptysis or hematemesis.  His last admission was about a month ago.  The patient denies any headache or blurry vision.  Denies any tingling or numbness.  Denies any focal weakness.  Hospitalist service has been asked to admit the patient for pain control.  REVIEW OF SYSTEMS:  See HPI, otherwise negative.  PAST MEDICAL HISTORY: 1. History of sickle cell disease with prior painful crisis usually     involving the ribs and lower extremities. 2. 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 3. History of PE 4 years ago, completed anticoagulation therapy. 4. History of auto infarct of the spleen. 5. History of chronic leukocytosis. 6. History of thrombocytosis. 7. History of hypokalemia. 8. History of anxiety. 9. History of umbilical hernia repair.  HOME MEDICATIONS: 1. Hydromorphone 4 mg q.4 h. p.r.n. 2. Ibuprofen t.i.d. p.r.n. 3. Ambien 10 mg at bedtime. 4.  Folic acid 1 mg daily. 5. Hydroxyurea 500 mg b.i.d. 6. OxyContin 20 mg b.i.d.  ALLERGIES:  The patient is allergic to MORPHINE SULFATE.  FAMILY HISTORY:  History of diabetes in the patient's mother.  SOCIAL HISTORY:  Occasional EtOH and tobacco use.  PERTINENT LABORATORY DATA:  On admission, the patient had a CBC done, which was significant for WBC count of 16.1, hemoglobin of 7.1, hematocrit of 21.2, and platelets of 535 with absolute reticulocyte count of 323.  Comprehensive metabolic panel is significant for potassium of 3.1 and total bilirubin of 3.8.  Urinalysis, negative for nitrites and leukocytes with rare bacteria.  DIAGNOSTIC STUDIES:  The patient had a chest x-ray done, which showed minimal left basilar opacities similar to the examination on Jun 05, 2010, possibly atelectasis or scar.  No definite superimposed acute cardiopulmonary disease.  PHYSICAL EXAMINATION:  VITAL SIGNS:  On exam, the patient's vitals include temperature of 98.8, pulse of 89, respirations 18, blood pressure 130/80, and saturating 100% on room air. GENERAL:  On exam, he is alert, afebrile, and comfortable in no acute distress. HEENT:  Normocephalic and atraumatic.  Pupils reacting to light. Icteric sclerae. NECK:  No JVD. CARDIOVASCULAR:  S1 and S2 heard.  No rubs, murmurs,  or gallops. LUNGS:  Chest is clear to auscultation bilaterally.  No wheezing or rhonchi. ABDOMEN:  Soft, nontender, and nondistended.  Bowel sounds are heard. EXTREMITIES:  No pedal edema, cyanosis, or clubbing. NEUROLOGICAL:  Alert and oriented x4.  No focal deficits.  IMPRESSION AND PLAN: 1. Sickle cell painful crisis, will be admitted for pain control,     start the patient on half normal saline at 150 mL per hour, pain     control with IV Dilaudid 1 to 2 mg q.3 h., continue with his home     oral medications of hydromorphone, OxyContin, hydroxyurea, and     folic acid.  We will consult Dr. [name] later today to  consider     assuming care of the patient from tomorrow. 2. Anemia from sickle cell crisis.  The patient's baseline hemoglobin     runs between 8 to 8.5.  His hemoglobin today is 7.1.  We will     transfuse him with 1 unit of PRBCs and repeat his CBC in a.m. 3. Hypokalemia.  We will replete his potassium and check potassium     tomorrow morning. 4. Thrombocytosis, chronic, appear to be stable. 5. Leukocytosis most likely nucleated RBCs, chronic. 6. His chest x-ray and UA is negative.  Does not appear to be septic.  The patient is full code.          ______________________________ Kathlen Mody, MD     VA/MEDQ  D:  09/08/2010  T:  09/08/2010  Job:  [id number]  Electronically Signed by Kathlen Mody MD on 09/08/2010 11:41:47 PM

## 2010-09-09 ENCOUNTER — Inpatient Hospital Stay (HOSPITAL_COMMUNITY): Payer: Medicare Other

## 2010-09-09 LAB — COMPREHENSIVE METABOLIC PANEL
ALT: 19 U/L (ref 0–53)
AST: 34 U/L (ref 0–37)
Albumin: 4 g/dL (ref 3.5–5.2)
Alkaline Phosphatase: 100 U/L (ref 39–117)
CO2: 21 mEq/L (ref 19–32)
Chloride: 107 mEq/L (ref 96–112)
Potassium: 3.3 mEq/L — ABNORMAL LOW (ref 3.5–5.1)
Sodium: 138 mEq/L (ref 135–145)
Total Bilirubin: 4.5 mg/dL — ABNORMAL HIGH (ref 0.3–1.2)

## 2010-09-09 LAB — CBC
MCV: 89.7 fL (ref 78.0–100.0)
Platelets: 540 10*3/uL — ABNORMAL HIGH (ref 150–400)
RBC: 2.43 MIL/uL — ABNORMAL LOW (ref 4.22–5.81)
WBC: 20.5 10*3/uL — ABNORMAL HIGH (ref 4.0–10.5)

## 2010-09-09 LAB — DIFFERENTIAL
Eosinophils Relative: 3 % (ref 0–5)
Lymphocytes Relative: 27 % (ref 12–46)
Lymphs Abs: 5.5 10*3/uL — ABNORMAL HIGH (ref 0.7–4.0)
Monocytes Relative: 12 % (ref 3–12)

## 2010-09-10 LAB — CROSSMATCH: ABO/RH(D): O POS

## 2010-09-10 LAB — COMPREHENSIVE METABOLIC PANEL
ALT: 15 U/L (ref 0–53)
Alkaline Phosphatase: 90 U/L (ref 39–117)
BUN: 4 mg/dL — ABNORMAL LOW (ref 6–23)
CO2: 23 mEq/L (ref 19–32)
Calcium: 8.5 mg/dL (ref 8.4–10.5)
Glucose, Bld: 93 mg/dL (ref 70–99)
Total Protein: 7 g/dL (ref 6.0–8.3)

## 2010-09-10 LAB — CBC
HCT: 21.6 % — ABNORMAL LOW (ref 39.0–52.0)
Hemoglobin: 7.3 g/dL — ABNORMAL LOW (ref 13.0–17.0)
MCH: 29.9 pg (ref 26.0–34.0)
MCHC: 33.8 g/dL (ref 30.0–36.0)
RBC: 2.44 MIL/uL — ABNORMAL LOW (ref 4.22–5.81)

## 2010-09-11 LAB — COMPREHENSIVE METABOLIC PANEL
ALT: 15 U/L (ref 0–53)
Albumin: 3.7 g/dL (ref 3.5–5.2)
Alkaline Phosphatase: 91 U/L (ref 39–117)
BUN: 4 mg/dL — ABNORMAL LOW (ref 6–23)
Chloride: 110 mEq/L (ref 96–112)
Potassium: 3.2 mEq/L — ABNORMAL LOW (ref 3.5–5.1)
Sodium: 141 mEq/L (ref 135–145)
Total Bilirubin: 4.3 mg/dL — ABNORMAL HIGH (ref 0.3–1.2)
Total Protein: 7.1 g/dL (ref 6.0–8.3)

## 2010-09-11 LAB — CBC
HCT: 21.9 % — ABNORMAL LOW (ref 39.0–52.0)
Hemoglobin: 7.5 g/dL — ABNORMAL LOW (ref 13.0–17.0)
MCHC: 34.2 g/dL (ref 30.0–36.0)
RDW: 18.6 % — ABNORMAL HIGH (ref 11.5–15.5)
WBC: 16.1 10*3/uL — ABNORMAL HIGH (ref 4.0–10.5)

## 2010-09-12 LAB — BASIC METABOLIC PANEL
BUN: 4 mg/dL — ABNORMAL LOW (ref 6–23)
CO2: 24 mEq/L (ref 19–32)
Calcium: 8.9 mg/dL (ref 8.4–10.5)
Creatinine, Ser: <0.47 mg/dL — ABNORMAL LOW (ref 0.50–1.35)
Glucose, Bld: 94 mg/dL (ref 70–99)

## 2010-09-13 ENCOUNTER — Inpatient Hospital Stay (HOSPITAL_COMMUNITY): Payer: Medicare Other

## 2010-09-13 LAB — CBC
MCH: 31 pg (ref 26.0–34.0)
MCHC: 34.2 g/dL (ref 30.0–36.0)
MCV: 90.6 fL (ref 78.0–100.0)
Platelets: 553 10*3/uL — ABNORMAL HIGH (ref 150–400)
RBC: 2.45 MIL/uL — ABNORMAL LOW (ref 4.22–5.81)
RDW: 18.1 % — ABNORMAL HIGH (ref 11.5–15.5)

## 2010-09-13 LAB — PROTIME-INR: Prothrombin Time: 15.1 seconds (ref 11.6–15.2)

## 2010-09-14 LAB — CBC
MCH: 30.4 pg (ref 26.0–34.0)
MCHC: 34 g/dL (ref 30.0–36.0)
MCV: 89.6 fL (ref 78.0–100.0)
Platelets: 489 10*3/uL — ABNORMAL HIGH (ref 150–400)
RBC: 2.4 MIL/uL — ABNORMAL LOW (ref 4.22–5.81)

## 2010-09-29 ENCOUNTER — Inpatient Hospital Stay (HOSPITAL_COMMUNITY)
Admission: EM | Admit: 2010-09-29 | Discharge: 2010-10-07 | DRG: 812 | Disposition: A | Discharge status: Home / Self Care | Payer: Medicare Other | Attending: Internal Medicine | Admitting: Internal Medicine

## 2010-09-29 DIAGNOSIS — R071 Chest pain on breathing: Secondary | ICD-10-CM | POA: Diagnosis present

## 2010-09-29 DIAGNOSIS — D5700 Hb-SS disease with crisis, unspecified: Principal | ICD-10-CM | POA: Diagnosis present

## 2010-09-29 LAB — CBC
MCH: 30.3 pg (ref 26.0–34.0)
MCV: 90 fL (ref 78.0–100.0)
Platelets: 611 10*3/uL — ABNORMAL HIGH (ref 150–400)
RDW: 18.2 % — ABNORMAL HIGH (ref 11.5–15.5)
WBC: 17.7 10*3/uL — ABNORMAL HIGH (ref 4.0–10.5)

## 2010-09-29 LAB — COMPREHENSIVE METABOLIC PANEL
CO2: 20 mEq/L (ref 19–32)
Calcium: 8.4 mg/dL (ref 8.4–10.5)
Creatinine, Ser: <0.47 mg/dL — ABNORMAL LOW (ref 0.50–1.35)
Glucose, Bld: 92 mg/dL (ref 70–99)

## 2010-09-29 LAB — DIFFERENTIAL
Basophils Relative: 1 % (ref 0–1)
Eosinophils Absolute: 0.6 10*3/uL (ref 0.0–0.7)
Eosinophils Relative: 3 % (ref 0–5)
Lymphs Abs: 1.3 10*3/uL (ref 0.7–4.0)

## 2010-09-29 LAB — RETICULOCYTES
Retic Count, Absolute: 301.3 10*3/uL — ABNORMAL HIGH (ref 19.0–186.0)
Retic Ct Pct: 12.5 % — ABNORMAL HIGH (ref 0.4–3.1)

## 2010-09-29 NOTE — H&P (Signed)
NAMELEONA, [name]             ACCOUNT NO.:  [id number]  MEDICAL RECORD NO.:  [id number]  LOCATION:  WLED                         FACILITY:  [facility]  PHYSICIAN:  Erick Blinks, MD     DATE OF BIRTH:  Oct 11, 1980  DATE OF ADMISSION:  09/29/2010 DATE OF DISCHARGE:                             HISTORY & PHYSICAL   PRIMARY CARE PHYSICIAN:  Dr. [name].  Sickle cell MD is Dr. Willey Blade.  CHIEF COMPLAINT:  Bilateral rib pain and abdominal pain.  HISTORY OF PRESENT ILLNESS:  This is a 30 year old gentleman with sickle cell disease who had multiple admissions in the past for painful crisis. The patient reports that he was discharged from the 14th this month and was doing fairly well.  He reports that yesterday, he had onset of bilateral rib pain as well as abdominal pain.  He also had associated vomiting.  The patient was evaluated in the emergency room and was given IV pain medicine as well as IV fluids.  He reports that his current presentation is very similar to his regular sickle cell crisis presentation.  His pain was unrelieved with medications in the emergency room and therefore, he has been referred for admission.  He denies any recent fever, cough, diarrhea, dysuria, or any other complaints.  The patient will be admitted for further treatment of sickle cell crisis.  PAST MEDICAL HISTORY: 1. History of sickle cell disease. 2. 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 3. History of pulmonary embolus 4 years ago, completed     anticoagulation. 4. History of chronic leukocytosis. 5. History of thrombocytosis. 6. History of hypokalemia. 7. History of anxiety. 8. History of umbilical hernia, status post repair.  MEDICATIONS PRIOR TO ADMISSION: 1. Metoprolol 12.5 mg b.i.d. 2. OxyContin 40 mg every 12 hours. 3. Zyprexa 10 mg daily at bedtime. 4. Ibuprofen 800 mg every 8 hours as needed. 5. Hydroxyurea 500 mg 1 capsule twice daily. 6. Dilaudid 4 mg  every 4 hours as needed. 7. Folic acid 1 mg 1 tablet daily. 8. Ambien 10 mg daily at bedtime as needed.  FAMILY HISTORY:  The patient's mother has diabetes.  SOCIAL HISTORY:  The patient denies any tobacco.  He quit approximately a year ago.  He says he has not drank any alcohol in the past few months and denies any drug use.  ALLERGIES:  He reports allergies to MORPHINE, which causes hives.  REVIEW OF SYSTEMS:  All systems reviewed and pertinent positives in the HPI.  PHYSICAL EXAMINATION:  VITAL SIGNS:  Temperature 98.1, blood pressure 131/82, heart rate of 76, respiratory rate of 18, and pulse oximetry of 99% on room air. GENERAL:  The patient is in no acute distress, lying in bed. HEENT:  Normocephalic and atraumatic.  Pupils are equal, round, and reactive to light. NECK:  Supple. CHEST:  Clear to auscultation bilaterally. CARDIAC:  Shows S1 and S2 with regular rate and rhythm. ABDOMEN:  Soft and nontender.  Bowel sounds are active. EXTREMITIES:  Show no signs of cyanosis, clubbing, or edema. NEUROLOGIC:  The patient has equal strength bilaterally.  Cranial nerves II through XII are grossly intact. SKIN:  Warm.  LABORATORY DATA:  CBC shows a WBC of 17.7, hemoglobin of 7.3, MCV of 90, platelet count of 611, and neutrophils 75%.  Reticulocyte percentage is elevated at 12.5.  A chemistry panel has not been sent.  ASSESSMENT AND PLAN:  Sickle cell crisis.  The patient will be admitted to regular bed, be given pain control as well as IV fluids.  Regarding his leukocytosis, this is likely reactive in nature as I do not see any clear source of infection.  We will check urinalysis as well as chest x- ray.  He does not have any diarrhea.  Regarding the patient's abdominal pain, again this may be related to his sickle cell disease, but we will check liver function test as well as lipase.  The patient will be treated supportively with antiemetics for now and will be continued on  a regular diet as he tolerates.  He reports that his bilateral rib pain is typical of his sickle crisis.  He is not tachycardic or hypoxic, and does not have pleuritic pain, therefore my suspicion for PE would be low. For his anemia, we will transfuse him  1 unit of PRBCs and repeat a hemoglobin in the morning.  Further orders will be per the clinical course.  Patient will be admitted to the service of Dr. Willey Blade    Erick Blinks, MD     JM/MEDQ  D:  09/29/2010  T:  09/29/2010  Job:  [id number]  Electronically Signed by Durward Mallard MEMON  on 09/29/2010 07:21:12 PM

## 2010-09-30 ENCOUNTER — Inpatient Hospital Stay (HOSPITAL_COMMUNITY): Payer: Medicare Other

## 2010-09-30 LAB — BASIC METABOLIC PANEL
BUN: 4 mg/dL — ABNORMAL LOW (ref 6–23)
Chloride: 110 mEq/L (ref 96–112)
Glucose, Bld: 88 mg/dL (ref 70–99)
Potassium: 3.3 mEq/L — ABNORMAL LOW (ref 3.5–5.1)

## 2010-09-30 LAB — URINALYSIS, ROUTINE W REFLEX MICROSCOPIC
Bilirubin Urine: NEGATIVE
Hgb urine dipstick: NEGATIVE
Nitrite: NEGATIVE
Specific Gravity, Urine: 1.009 (ref 1.005–1.030)
pH: 6.5 (ref 5.0–8.0)

## 2010-09-30 LAB — DIFFERENTIAL
Basophils Absolute: 0.2 10*3/uL — ABNORMAL HIGH (ref 0.0–0.1)
Lymphocytes Relative: 20 % (ref 12–46)
Monocytes Relative: 15 % — ABNORMAL HIGH (ref 3–12)
Neutro Abs: 10.2 10*3/uL — ABNORMAL HIGH (ref 1.7–7.7)

## 2010-09-30 LAB — CROSSMATCH

## 2010-09-30 LAB — CBC
Hemoglobin: 7.5 g/dL — ABNORMAL LOW (ref 13.0–17.0)
MCH: 30 pg (ref 26.0–34.0)
RBC: 2.5 MIL/uL — ABNORMAL LOW (ref 4.22–5.81)
WBC: 17.1 10*3/uL — ABNORMAL HIGH (ref 4.0–10.5)

## 2010-10-01 LAB — CBC
HCT: 22.3 % — ABNORMAL LOW (ref 39.0–52.0)
Hemoglobin: 7.5 g/dL — ABNORMAL LOW (ref 13.0–17.0)
MCV: 87.8 fL (ref 78.0–100.0)
RBC: 2.54 MIL/uL — ABNORMAL LOW (ref 4.22–5.81)
WBC: 18.9 10*3/uL — ABNORMAL HIGH (ref 4.0–10.5)

## 2010-10-01 LAB — COMPREHENSIVE METABOLIC PANEL
Alkaline Phosphatase: 102 U/L (ref 39–117)
BUN: 4 mg/dL — ABNORMAL LOW (ref 6–23)
CO2: 21 mEq/L (ref 19–32)
Chloride: 107 mEq/L (ref 96–112)
Creatinine, Ser: <0.47 mg/dL — ABNORMAL LOW (ref 0.50–1.35)
Glucose, Bld: 96 mg/dL (ref 70–99)
Total Bilirubin: 3.4 mg/dL — ABNORMAL HIGH (ref 0.3–1.2)

## 2010-10-02 LAB — CBC
Platelets: 639 10*3/uL — ABNORMAL HIGH (ref 150–400)
RBC: 2.72 MIL/uL — ABNORMAL LOW (ref 4.22–5.81)
RDW: 18.1 % — ABNORMAL HIGH (ref 11.5–15.5)
WBC: 21.3 10*3/uL — ABNORMAL HIGH (ref 4.0–10.5)

## 2010-10-02 LAB — COMPREHENSIVE METABOLIC PANEL
ALT: 16 U/L (ref 0–53)
AST: 27 U/L (ref 0–37)
Albumin: 4.2 g/dL (ref 3.5–5.2)
CO2: 23 mEq/L (ref 19–32)
Calcium: 9.2 mg/dL (ref 8.4–10.5)
Chloride: 104 mEq/L (ref 96–112)
Sodium: 137 mEq/L (ref 135–145)
Total Bilirubin: 4.2 mg/dL — ABNORMAL HIGH (ref 0.3–1.2)

## 2010-10-05 LAB — COMPREHENSIVE METABOLIC PANEL
ALT: 30 U/L (ref 0–53)
CO2: 26 mEq/L (ref 19–32)
Calcium: 9 mg/dL (ref 8.4–10.5)
Creatinine, Ser: 0.49 mg/dL — ABNORMAL LOW (ref 0.50–1.35)
GFR calc Af Amer: >60 mL/min (ref >60)
GFR calc non Af Amer: >60 mL/min (ref >60)
Glucose, Bld: 116 mg/dL — ABNORMAL HIGH (ref 70–99)
Sodium: 135 mEq/L (ref 135–145)
Total Protein: 7.8 g/dL (ref 6.0–8.3)

## 2010-10-05 LAB — CBC
Hemoglobin: 7.2 g/dL — ABNORMAL LOW (ref 13.0–17.0)
MCH: 30.1 pg (ref 26.0–34.0)
MCHC: 34 g/dL (ref 30.0–36.0)
MCV: 88.7 fL (ref 78.0–100.0)
RBC: 2.39 MIL/uL — ABNORMAL LOW (ref 4.22–5.81)

## 2010-10-06 LAB — COMPREHENSIVE METABOLIC PANEL
ALT: 39 U/L (ref 0–53)
Alkaline Phosphatase: 141 U/L — ABNORMAL HIGH (ref 39–117)
BUN: 11 mg/dL (ref 6–23)
CO2: 25 mEq/L (ref 19–32)
Chloride: 102 mEq/L (ref 96–112)
GFR calc Af Amer: >60 mL/min (ref >60)
Glucose, Bld: 110 mg/dL — ABNORMAL HIGH (ref 70–99)
Potassium: 4.1 mEq/L (ref 3.5–5.1)
Sodium: 135 mEq/L (ref 135–145)
Total Bilirubin: 3.6 mg/dL — ABNORMAL HIGH (ref 0.3–1.2)
Total Protein: 7.4 g/dL (ref 6.0–8.3)

## 2010-10-06 LAB — CBC
HCT: 19 % — ABNORMAL LOW (ref 39.0–52.0)
Hemoglobin: 6.4 g/dL — CL (ref 13.0–17.0)
RBC: 2.13 MIL/uL — ABNORMAL LOW (ref 4.22–5.81)
WBC: 15.3 10*3/uL — ABNORMAL HIGH (ref 4.0–10.5)

## 2010-10-06 LAB — URIC ACID: Uric Acid, Serum: 5.6 mg/dL (ref 4.0–7.8)

## 2010-10-06 IMAGING — CR DG CHEST 2V
2 series · 2 of 2 positions shown · non-contrast
Comparison: 09/24/2007

CLINICAL DATA: Right-sided chest pain.  Sickle cell disease.
Headache.

CHEST - 2 VIEW

[w chest pa]
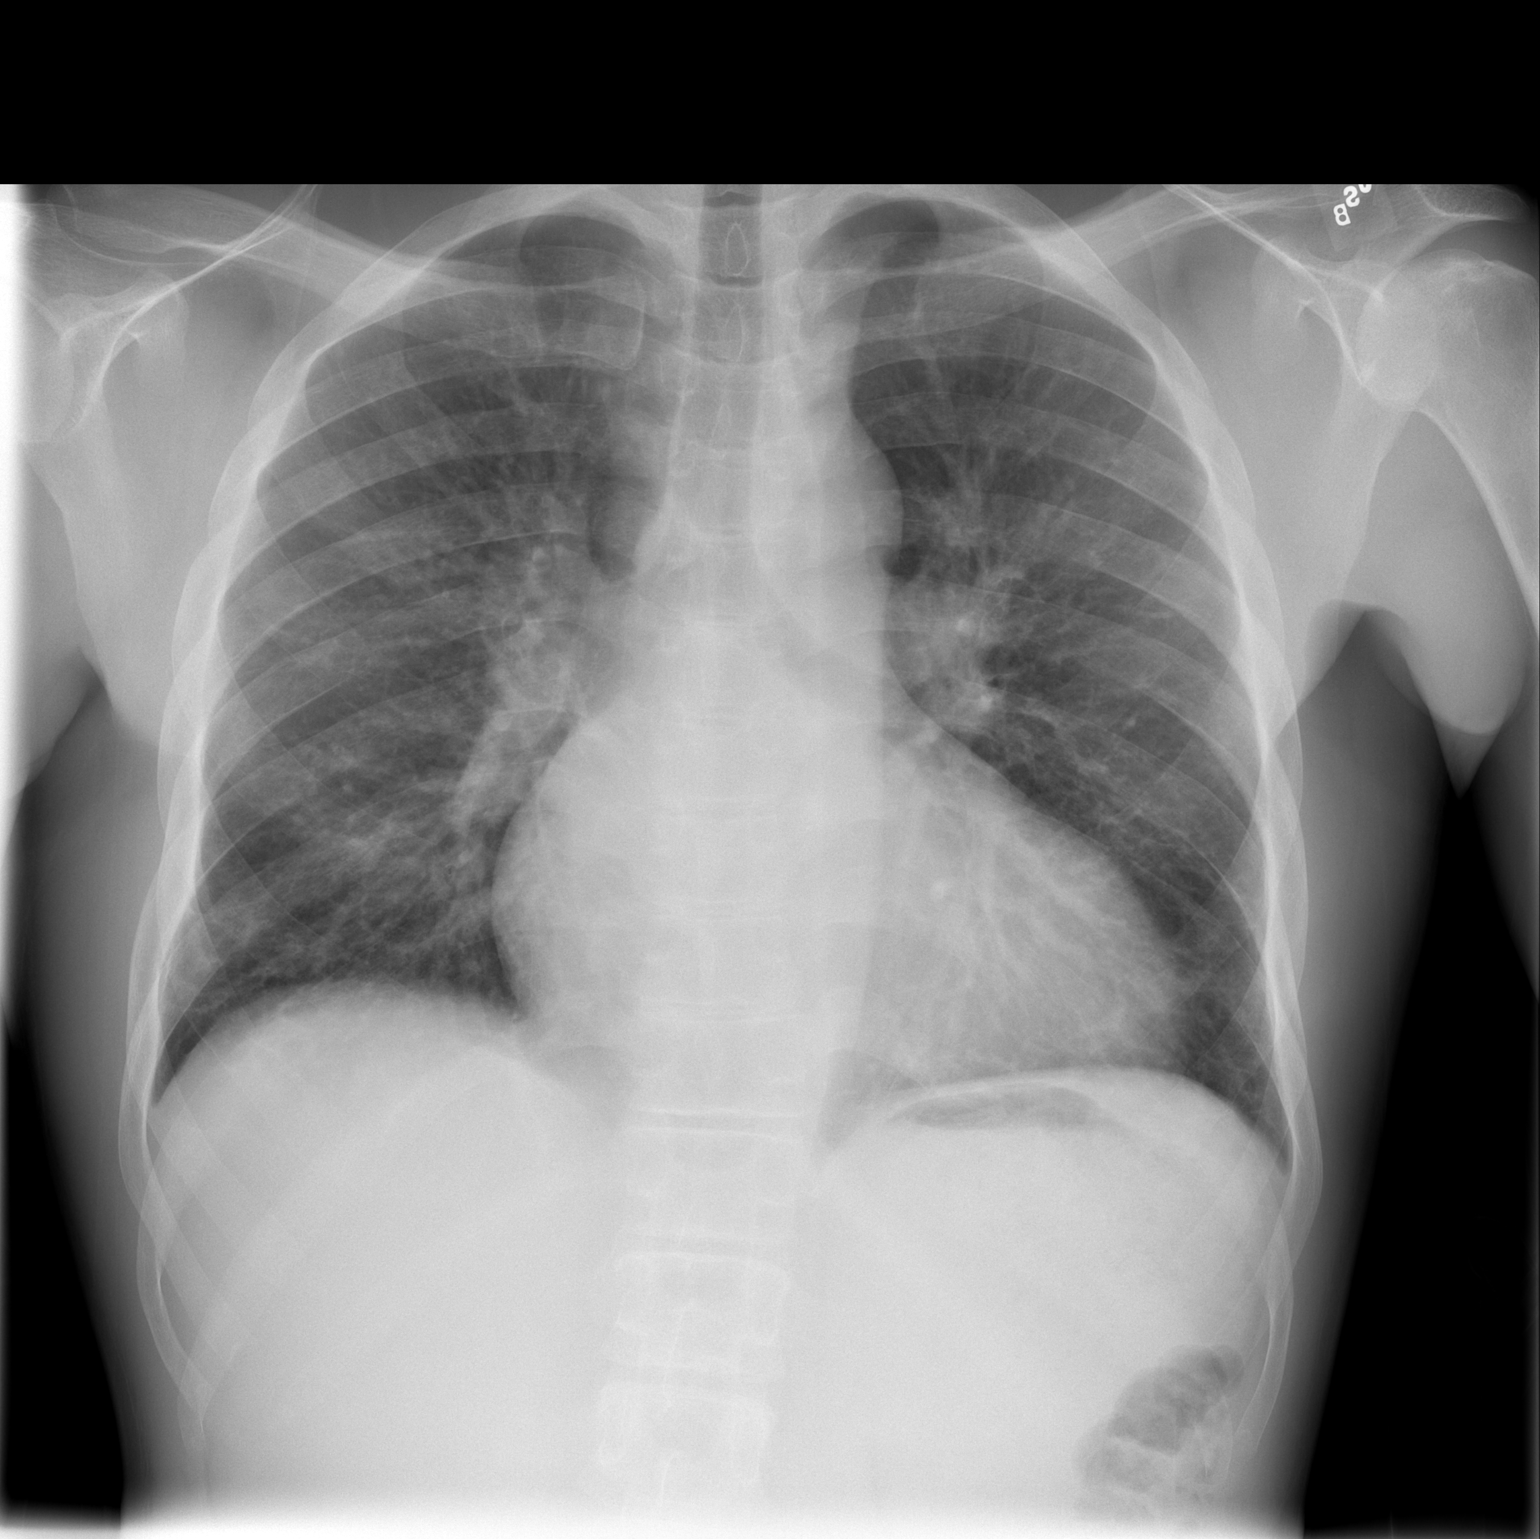

[w chest lat]
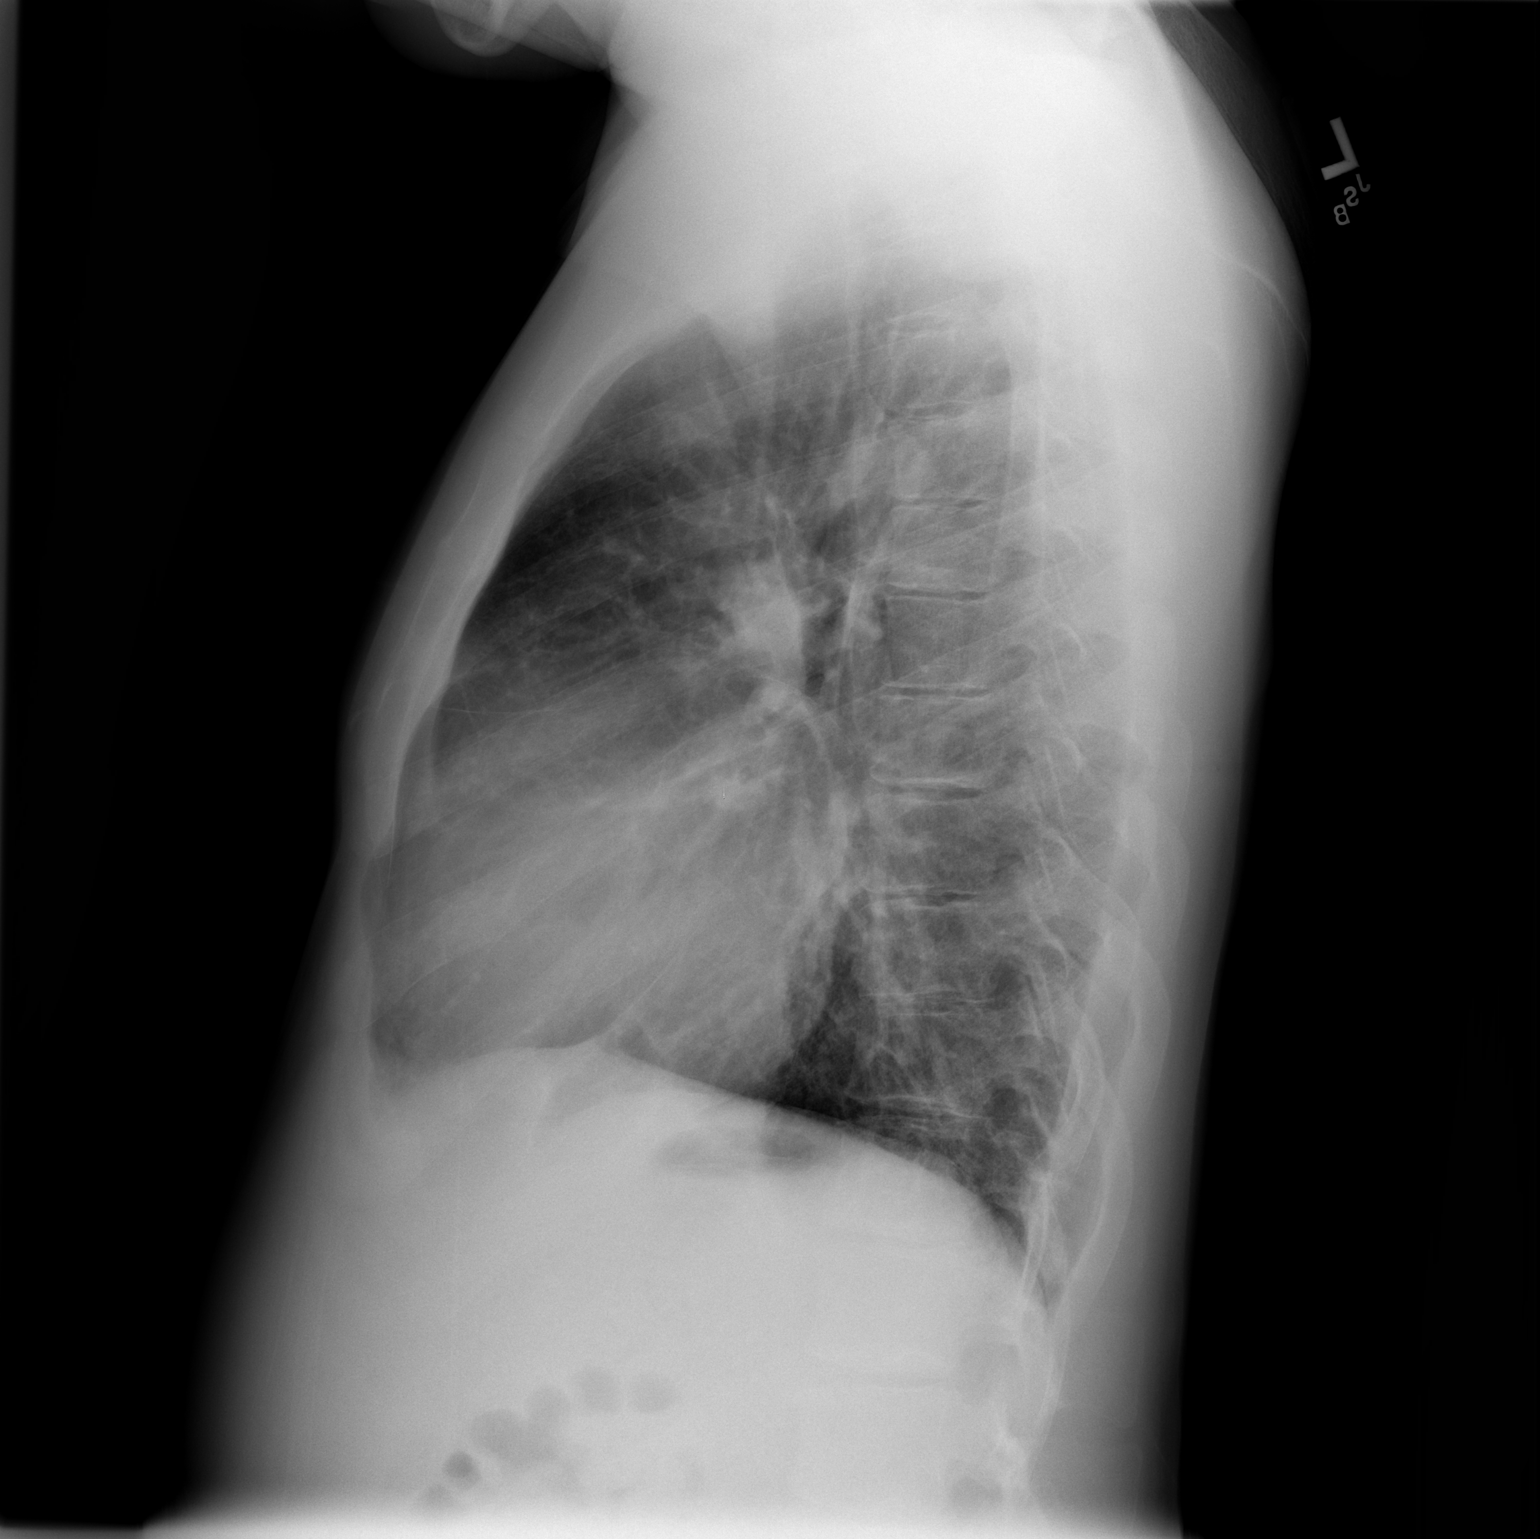

[2 of 2 positions shown; findings below may reference images not displayed]

FINDINGS: Mild cardiomegaly noted.  The mediastinum appears
otherwise unremarkable.

Vascular prominence compatible with pulmonary venous hypertension
noted.  Minimal scarring noted peripherally at the right lung base.
Mild interstitial prominence is present.
IMPRESSION: 1.  Cardiomegaly with pulmonary venous hypertension and mild
interstitial prominence.  Although the appearance is borderline for
interstitial edema, mild atypical pneumonia is a differential
diagnostic consideration.
2.  Mild scarring at the right lung base peripherally.

## 2010-10-07 LAB — TYPE AND SCREEN
ABO/RH(D): O POS
Unit division: 0

## 2010-10-07 LAB — CBC
HCT: 20.9 % — ABNORMAL LOW (ref 39.0–52.0)
MCH: 29.3 pg (ref 26.0–34.0)
MCV: 87.4 fL (ref 78.0–100.0)
RBC: 2.39 MIL/uL — ABNORMAL LOW (ref 4.22–5.81)
RDW: 19 % — ABNORMAL HIGH (ref 11.5–15.5)
WBC: 14.4 10*3/uL — ABNORMAL HIGH (ref 4.0–10.5)

## 2010-10-07 LAB — COMPREHENSIVE METABOLIC PANEL
BUN: 9 mg/dL (ref 6–23)
CO2: 24 mEq/L (ref 19–32)
Calcium: 9.1 mg/dL (ref 8.4–10.5)
Chloride: 103 mEq/L (ref 96–112)
Creatinine, Ser: 0.5 mg/dL (ref 0.50–1.35)
GFR calc non Af Amer: >60 mL/min (ref >60)
Total Bilirubin: 4.3 mg/dL — ABNORMAL HIGH (ref 0.3–1.2)

## 2010-10-07 IMAGING — US US ABDOMEN COMPLETE
1 series · 14 of 25 positions shown · non-contrast
Comparison: Abdominal CT 07/14/2006.

CLINICAL DATA: Sickle cell.  Pain.  Gallstones on CT.

ABDOMEN ULTRASOUND
TECHNIQUE: Complete abdominal ultrasound examination was performed
including evaluation of the liver, gallbladder, bile ducts,
pancreas, kidneys, spleen, IVC, and abdominal aorta.

[Series 1: unknown · 0.28mm/px · 14 of 61 slices shown]
[im 1/61]
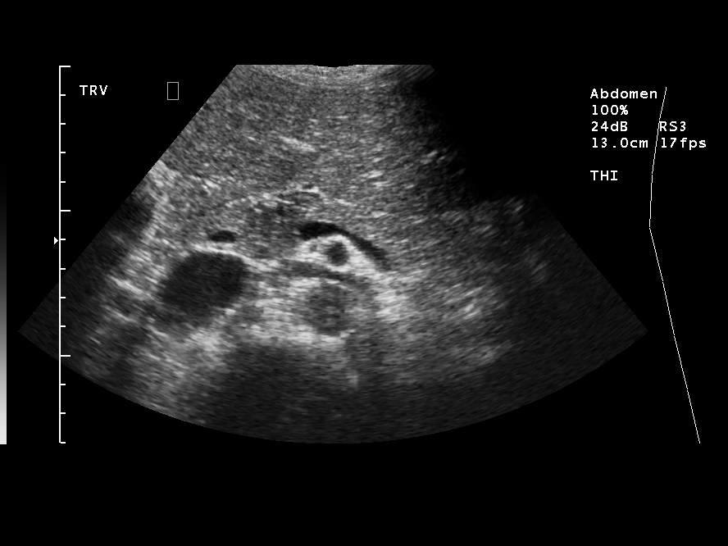
[im 6/61]
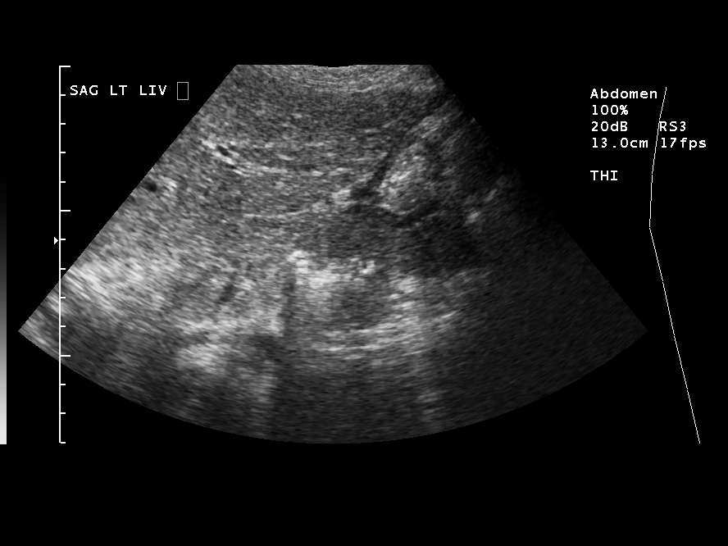
[im 11/61]
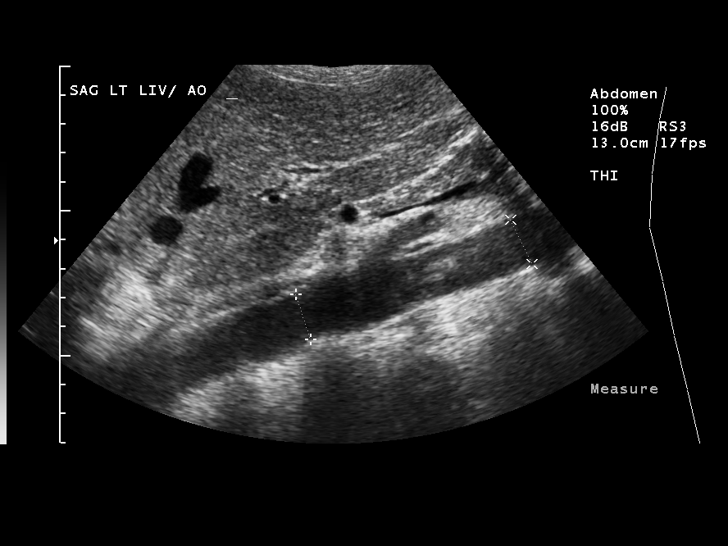
[im 16/61]
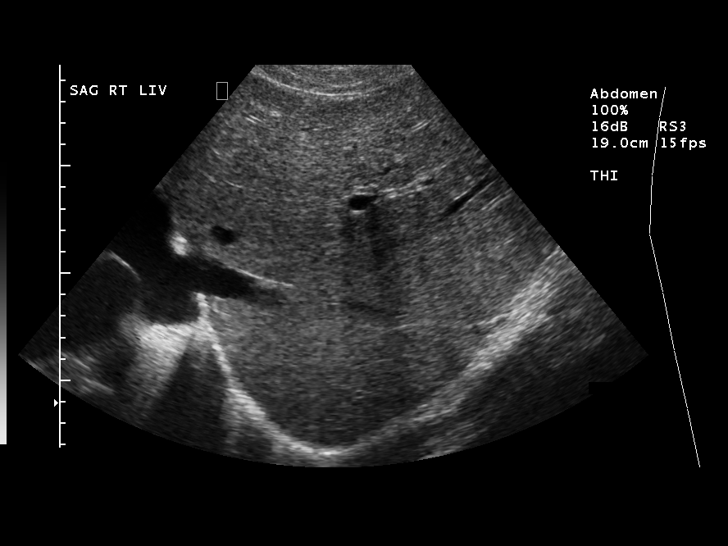
[im 21/61]
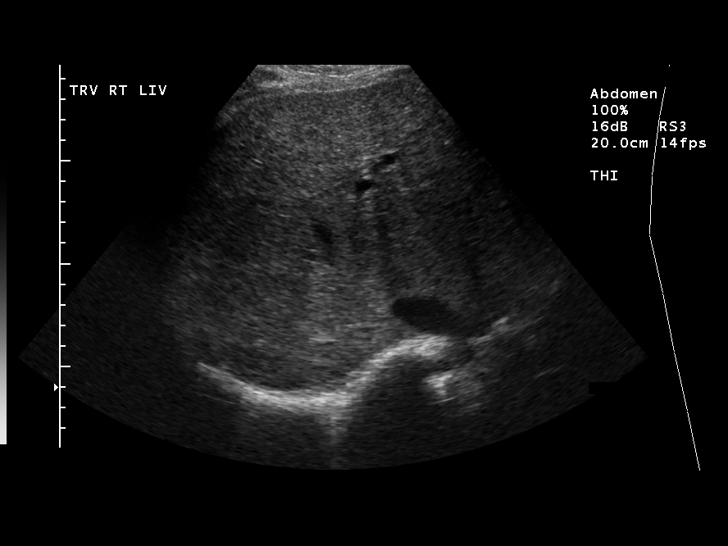
[im 23/61]
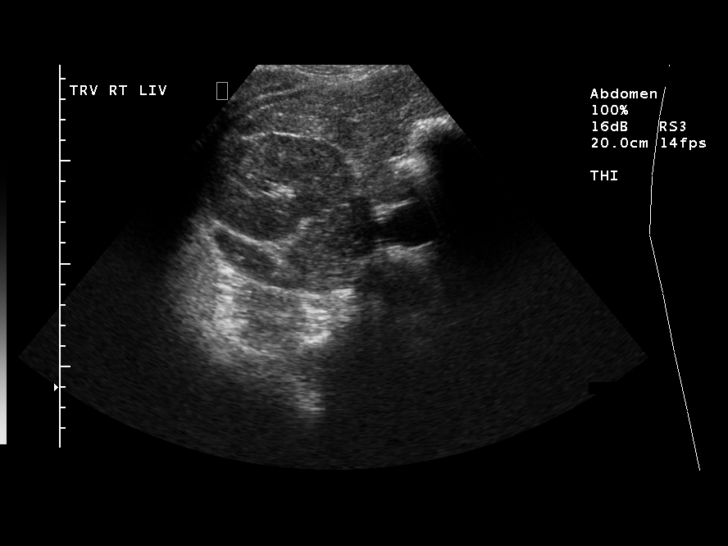
[im 28/61]
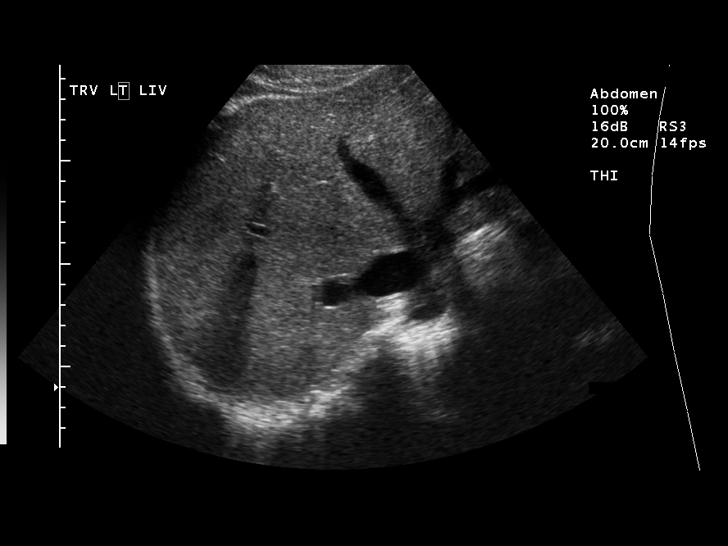
[im 33/61]
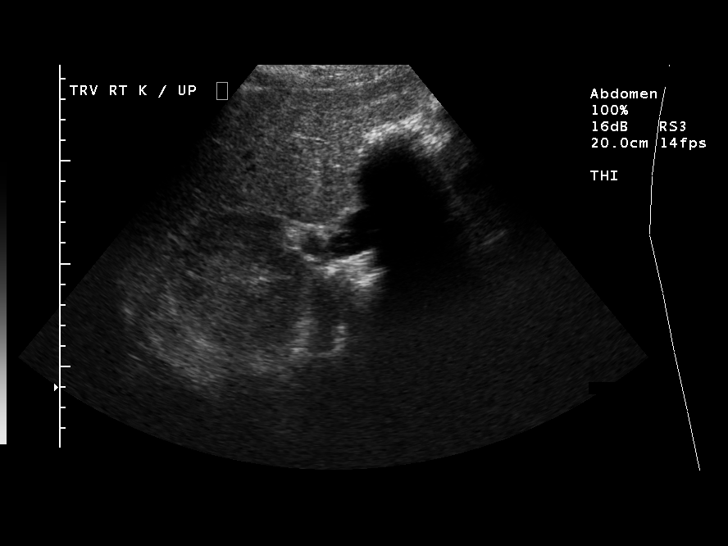
[im 38/61]
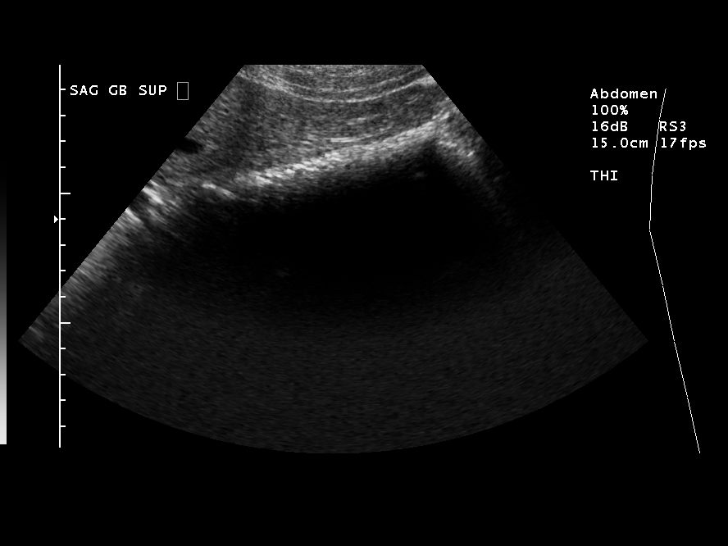
[im 41/61]
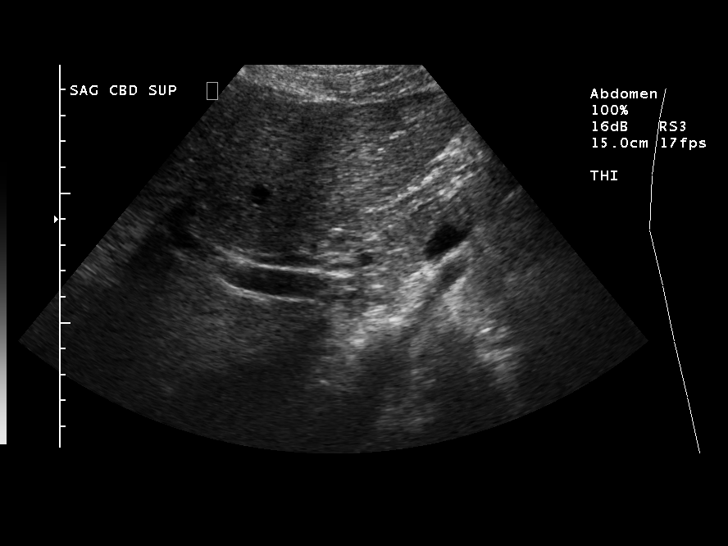
[im 46/61]
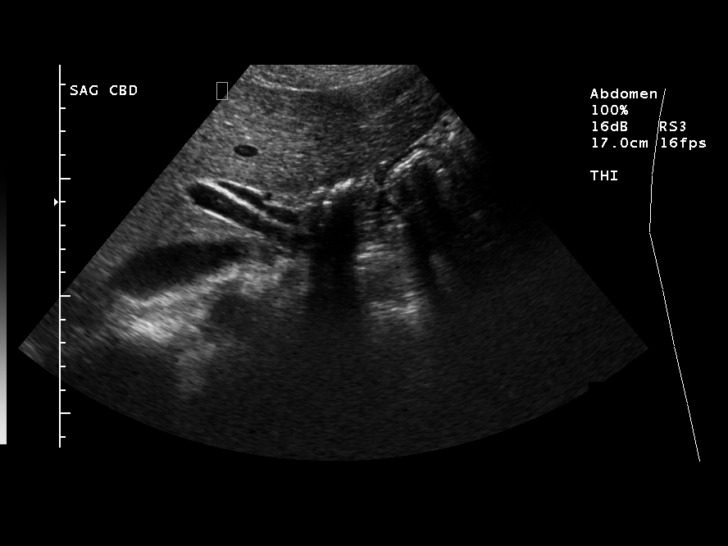
[im 51/61]
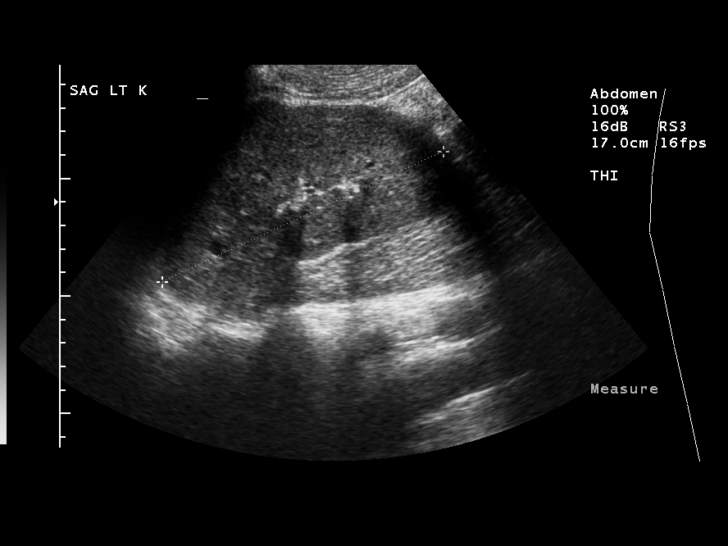
[im 56/61]
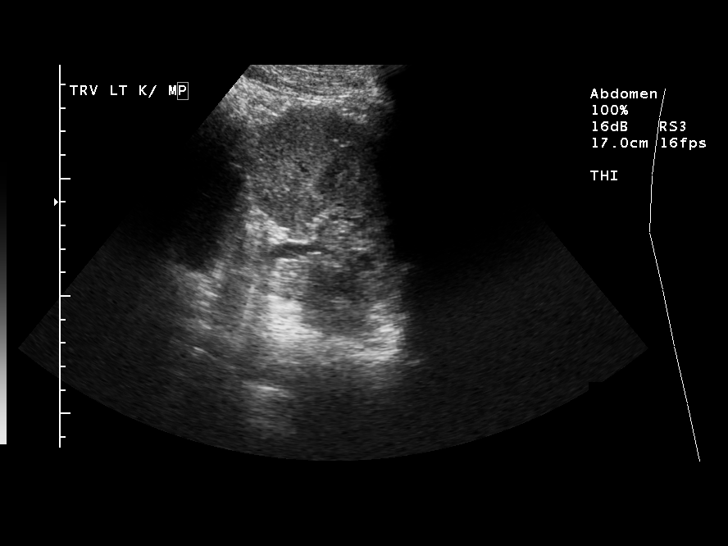
[im 61/61]
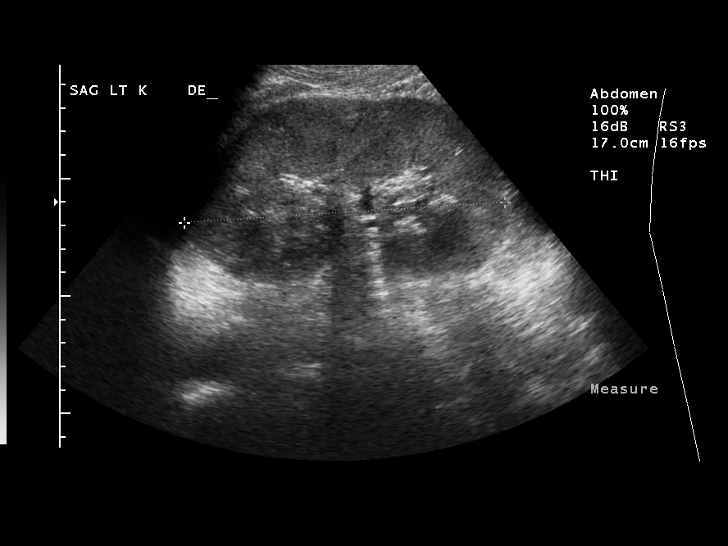

[14 of 25 positions shown; findings below may reference images not displayed]

FINDINGS: Stone filled likely contracted gallbladder.  No
pericholecystic fluid or sonographic Murphy's sign. common duct
normal at 6 mm.

Hepatomegaly at 21 cm.

The spleen is not well imaged.  Likely mildly atrophic due to
sickle cell disease.

Right kidney 16.0 and left kidney 13.7cm.  No hydronephrosis.
Abdominal aorta nonaneurysmal without ascites.
IMPRESSION: 1.  Cholelithiasis with likely contracted gallbladder.  No evidence
of acute cholecystitis.
2.  Hepatomegaly.

## 2010-10-20 LAB — BASIC METABOLIC PANEL
BUN: 5 — ABNORMAL LOW
CO2: 24
Calcium: 8 — ABNORMAL LOW
Chloride: 103
Creatinine, Ser: 0.52
GFR calc Af Amer: >60
GFR calc non Af Amer: >60
Glucose, Bld: 83
Potassium: 3 — ABNORMAL LOW
Sodium: 133 — ABNORMAL LOW

## 2010-10-20 LAB — DIFFERENTIAL
Basophils Absolute: 0
Lymphocytes Relative: 15
Lymphs Abs: 2.1
Monocytes Absolute: 1.5 — ABNORMAL HIGH
Monocytes Relative: 11
Neutro Abs: 9.6 — ABNORMAL HIGH

## 2010-10-20 LAB — CBC
Hemoglobin: 8.6 — ABNORMAL LOW
RBC: 2.66 — ABNORMAL LOW
WBC: 13.7 — ABNORMAL HIGH

## 2010-10-20 LAB — RETICULOCYTES: Retic Count, Absolute: 224.3 — ABNORMAL HIGH

## 2010-10-21 LAB — BASIC METABOLIC PANEL
Chloride: 101
Creatinine, Ser: 0.57
GFR calc Af Amer: >60
Potassium: 3.4 — ABNORMAL LOW
Sodium: 134 — ABNORMAL LOW

## 2010-10-21 LAB — DIFFERENTIAL
Basophils Relative: 1
Eosinophils Absolute: 0.4
Eosinophils Relative: 2
Monocytes Relative: 11
Neutrophils Relative %: 78 — ABNORMAL HIGH

## 2010-10-21 LAB — RETICULOCYTES
RBC.: 3.08 — ABNORMAL LOW
Retic Ct Pct: 9.4 — ABNORMAL HIGH

## 2010-10-21 LAB — CBC
HCT: 30.4 — ABNORMAL LOW
MCHC: 33.5
MCV: 97.5
Platelets: 526 — ABNORMAL HIGH

## 2010-10-22 ENCOUNTER — Emergency Department (HOSPITAL_COMMUNITY)
Admission: EM | Admit: 2010-10-22 | Discharge: 2010-10-22 | Disposition: A | Discharge status: Home / Self Care | Payer: Medicare Other | Attending: Emergency Medicine | Admitting: Emergency Medicine

## 2010-10-22 DIAGNOSIS — M549 Dorsalgia, unspecified: Secondary | ICD-10-CM | POA: Insufficient documentation

## 2010-10-22 DIAGNOSIS — D571 Sickle-cell disease without crisis: Secondary | ICD-10-CM | POA: Insufficient documentation

## 2010-10-22 LAB — CBC
HCT: 22.4 — ABNORMAL LOW
Hemoglobin: 7.7 — CL
Hemoglobin: 8.4 — ABNORMAL LOW
MCHC: 34.2
MCHC: 34.4
MCV: 93.4
RBC: 2.39 — ABNORMAL LOW
RDW: 19.6 — ABNORMAL HIGH
RDW: 20.3 — ABNORMAL HIGH

## 2010-10-22 LAB — BASIC METABOLIC PANEL
CO2: 26
Calcium: 8.4
Chloride: 107
GFR calc Af Amer: >60
Glucose, Bld: 100 — ABNORMAL HIGH
Potassium: 3.2 — ABNORMAL LOW
Sodium: 136

## 2010-10-22 LAB — CROSSMATCH: ABO/RH(D): O POS

## 2010-10-22 LAB — PROTIME-INR
INR: 1.3
Prothrombin Time: 16 — ABNORMAL HIGH

## 2010-10-22 LAB — DIFFERENTIAL
Basophils Absolute: 0
Basophils Relative: 0
Eosinophils Absolute: 0.5
Monocytes Absolute: 0.4
Neutro Abs: 18.8 — ABNORMAL HIGH

## 2010-10-22 LAB — CULTURE, BLOOD (ROUTINE X 2): Culture: NO GROWTH

## 2010-10-22 LAB — I-STAT 8, (EC8 V) (CONVERTED LAB)
BUN: 9
Bicarbonate: 24.4 — ABNORMAL HIGH
Chloride: 103
Glucose, Bld: 97
HCT: 28 — ABNORMAL LOW
Hemoglobin: 9.5 — ABNORMAL LOW
Operator id: 257131
Potassium: 3.3 — ABNORMAL LOW
Sodium: 138

## 2010-10-22 LAB — IRON AND TIBC: TIBC: 192 — ABNORMAL LOW

## 2010-10-22 LAB — RETICULOCYTES: Retic Count, Absolute: 208.6 — ABNORMAL HIGH

## 2010-10-23 ENCOUNTER — Inpatient Hospital Stay (HOSPITAL_COMMUNITY)
Admission: EM | Admit: 2010-10-23 | Discharge: 2010-10-28 | DRG: 812 | Disposition: A | Discharge status: Home / Self Care | Payer: Medicare Other | Attending: Internal Medicine | Admitting: Internal Medicine

## 2010-10-23 ENCOUNTER — Emergency Department (HOSPITAL_COMMUNITY): Payer: Medicare Other

## 2010-10-23 DIAGNOSIS — F17200 Nicotine dependence, unspecified, uncomplicated: Secondary | ICD-10-CM | POA: Diagnosis present

## 2010-10-23 DIAGNOSIS — F411 Generalized anxiety disorder: Secondary | ICD-10-CM | POA: Diagnosis present

## 2010-10-23 DIAGNOSIS — I808 Phlebitis and thrombophlebitis of other sites: Secondary | ICD-10-CM | POA: Diagnosis present

## 2010-10-23 DIAGNOSIS — Z86711 Personal history of pulmonary embolism: Secondary | ICD-10-CM

## 2010-10-23 DIAGNOSIS — T82598A Other mechanical complication of other cardiac and vascular devices and implants, initial encounter: Secondary | ICD-10-CM | POA: Diagnosis present

## 2010-10-23 DIAGNOSIS — J029 Acute pharyngitis, unspecified: Secondary | ICD-10-CM | POA: Diagnosis present

## 2010-10-23 DIAGNOSIS — Z96649 Presence of unspecified artificial hip joint: Secondary | ICD-10-CM

## 2010-10-23 DIAGNOSIS — D5700 Hb-SS disease with crisis, unspecified: Principal | ICD-10-CM | POA: Diagnosis present

## 2010-10-23 DIAGNOSIS — Z79899 Other long term (current) drug therapy: Secondary | ICD-10-CM

## 2010-10-23 DIAGNOSIS — D72829 Elevated white blood cell count, unspecified: Secondary | ICD-10-CM | POA: Diagnosis present

## 2010-10-23 DIAGNOSIS — Y849 Medical procedure, unspecified as the cause of abnormal reaction of the patient, or of later complication, without mention of misadventure at the time of the procedure: Secondary | ICD-10-CM | POA: Diagnosis present

## 2010-10-23 DIAGNOSIS — T80218A Other infection due to central venous catheter, initial encounter: Secondary | ICD-10-CM | POA: Diagnosis present

## 2010-10-23 DIAGNOSIS — E876 Hypokalemia: Secondary | ICD-10-CM | POA: Diagnosis present

## 2010-10-23 HISTORY — DX: Sickle-cell disease without crisis: D57.1

## 2010-10-23 LAB — BASIC METABOLIC PANEL
BUN: 4 mg/dL — ABNORMAL LOW (ref 6–23)
Calcium: 9.3 mg/dL (ref 8.4–10.5)
Creatinine, Ser: <0.47 mg/dL — ABNORMAL LOW (ref 0.50–1.35)

## 2010-10-23 LAB — CBC
MCHC: 34.3 g/dL (ref 30.0–36.0)
Platelets: 488 10*3/uL — ABNORMAL HIGH (ref 150–400)
RDW: 18.4 % — ABNORMAL HIGH (ref 11.5–15.5)
WBC: 23.4 10*3/uL — ABNORMAL HIGH (ref 4.0–10.5)

## 2010-10-23 LAB — RETICULOCYTES
RBC.: 2.45 MIL/uL — ABNORMAL LOW (ref 4.22–5.81)
Retic Count, Absolute: 245 10*3/uL — ABNORMAL HIGH (ref 19.0–186.0)
Retic Ct Pct: 10 % — ABNORMAL HIGH (ref 0.4–3.1)

## 2010-10-23 LAB — DIFFERENTIAL
Basophils Absolute: 0.2 10*3/uL — ABNORMAL HIGH (ref 0.0–0.1)
Lymphs Abs: 1.9 10*3/uL (ref 0.7–4.0)
Monocytes Absolute: 2.3 10*3/uL — ABNORMAL HIGH (ref 0.1–1.0)
Neutrophils Relative %: 79 % — ABNORMAL HIGH (ref 43–77)

## 2010-10-24 LAB — URINALYSIS, ROUTINE W REFLEX MICROSCOPIC
Bilirubin Urine: NEGATIVE
Glucose, UA: NEGATIVE mg/dL
Hgb urine dipstick: NEGATIVE
Ketones, ur: NEGATIVE mg/dL
Leukocytes, UA: NEGATIVE
Nitrite: NEGATIVE
Protein, ur: NEGATIVE mg/dL
Specific Gravity, Urine: 1.012 (ref 1.005–1.030)
Urobilinogen, UA: 1 mg/dL (ref 0.0–1.0)
pH: 6.5 (ref 5.0–8.0)

## 2010-10-24 LAB — BASIC METABOLIC PANEL
BUN: 4 mg/dL — ABNORMAL LOW (ref 6–23)
CO2: 20 mEq/L (ref 19–32)
Calcium: 8.6 mg/dL (ref 8.4–10.5)
Chloride: 110 mEq/L (ref 96–112)
Creatinine, Ser: <0.47 mg/dL — ABNORMAL LOW (ref 0.50–1.35)
Glucose, Bld: 90 mg/dL (ref 70–99)
Potassium: 3.4 mEq/L — ABNORMAL LOW (ref 3.5–5.1)
Sodium: 139 mEq/L (ref 135–145)

## 2010-10-24 LAB — CBC
HCT: 22.1 % — ABNORMAL LOW (ref 39.0–52.0)
Hemoglobin: 7.7 g/dL — ABNORMAL LOW (ref 13.0–17.0)
MCH: 30.4 pg (ref 26.0–34.0)
MCHC: 34.8 g/dL (ref 30.0–36.0)
MCV: 87.4 fL (ref 78.0–100.0)
Platelets: 452 10*3/uL — ABNORMAL HIGH (ref 150–400)
RBC: 2.53 MIL/uL — ABNORMAL LOW (ref 4.22–5.81)
RDW: 18.8 % — ABNORMAL HIGH (ref 11.5–15.5)
WBC: 23.6 10*3/uL — ABNORMAL HIGH (ref 4.0–10.5)

## 2010-10-24 LAB — MRSA PCR SCREENING: MRSA by PCR: NEGATIVE

## 2010-10-25 LAB — BASIC METABOLIC PANEL
BUN: 4 — ABNORMAL LOW
BUN: 5 — ABNORMAL LOW
CO2: 26
CO2: 27
CO2: 26
Calcium: 8.7
Chloride: 104
Chloride: 107
Creatinine, Ser: 0.58
Creatinine, Ser: 0.7
Glucose, Bld: 91
Potassium: 3.2 — ABNORMAL LOW
Sodium: 134 — ABNORMAL LOW

## 2010-10-25 LAB — DIFFERENTIAL
Basophils Absolute: 0
Basophils Absolute: 0.1
Basophils Relative: 0
Basophils Relative: 0
Eosinophils Absolute: 0.5
Eosinophils Relative: 3
Eosinophils Relative: 6 — ABNORMAL HIGH
Eosinophils Relative: 3
Lymphs Abs: 1.9
Lymphs Abs: 2.7
Monocytes Absolute: 0.7
Monocytes Absolute: 1.6 — ABNORMAL HIGH
Monocytes Absolute: 2.3 — ABNORMAL HIGH
Neutro Abs: 14.7 — ABNORMAL HIGH

## 2010-10-25 LAB — LIPID PANEL
LDL Cholesterol: 17
Triglycerides: 134
VLDL: 27

## 2010-10-25 LAB — CROSSMATCH: DAT, IgG: NEGATIVE

## 2010-10-25 LAB — CBC
HCT: 27.3 — ABNORMAL LOW
HCT: 31.1 — ABNORMAL LOW
HCT: 26 — ABNORMAL LOW
Hemoglobin: 9.2 — ABNORMAL LOW
Hemoglobin: 9.2 — ABNORMAL LOW
Hemoglobin: 8.6 — ABNORMAL LOW
MCHC: 32.9
MCHC: 33
MCHC: 33.6
MCHC: 33.7
MCHC: 33.2
MCV: 95.6
MCV: 97.3
MCV: 97.3
MCV: 97.5
Platelets: 453 — ABNORMAL HIGH
Platelets: 456 — ABNORMAL HIGH
Platelets: 468 — ABNORMAL HIGH
RBC: 2.86 — ABNORMAL LOW
RBC: 2.88 — ABNORMAL LOW
RDW: 19.8 — ABNORMAL HIGH
RDW: 21.3 — ABNORMAL HIGH
RDW: 21.6 — ABNORMAL HIGH
RDW: 17.1 — ABNORMAL HIGH
WBC: 18.4 — ABNORMAL HIGH
WBC: 20.7 — ABNORMAL HIGH

## 2010-10-25 LAB — I-STAT 8, (EC8 V) (CONVERTED LAB)
BUN: 5 — ABNORMAL LOW
Chloride: 106
Glucose, Bld: 94
Potassium: 3.3 — ABNORMAL LOW
Sodium: 141

## 2010-10-25 LAB — TSH: TSH: 1.321

## 2010-10-25 LAB — RETICULOCYTES
Retic Count, Absolute: 227 — ABNORMAL HIGH
Retic Ct Pct: 8.5 — ABNORMAL HIGH

## 2010-10-25 LAB — CK TOTAL AND CKMB (NOT AT ARMC)
CK, MB: 0.6
Relative Index: INVALID
Relative Index: INVALID
Total CK: 37

## 2010-10-25 LAB — CULTURE, BLOOD (ROUTINE X 2)
Culture: NO GROWTH
Culture: NO GROWTH

## 2010-10-25 LAB — POCT I-STAT CREATININE
Creatinine, Ser: 0.7
Operator id: 198171

## 2010-10-26 ENCOUNTER — Inpatient Hospital Stay (HOSPITAL_COMMUNITY): Payer: Medicare Other

## 2010-10-26 ENCOUNTER — Encounter (HOSPITAL_COMMUNITY): Payer: Self-pay | Admitting: Radiology

## 2010-10-26 LAB — CBC
HCT: 23.6 % — ABNORMAL LOW (ref 39.0–52.0)
Hemoglobin: 8 — ABNORMAL LOW
Hemoglobin: 8.1 — ABNORMAL LOW
MCHC: 34.7 g/dL (ref 30.0–36.0)
MCHC: 34.4
MCV: 85.8 fL (ref 78.0–100.0)
MCV: 101.7 — ABNORMAL HIGH
RBC: 2.3 — ABNORMAL LOW
RBC: 2.4 — ABNORMAL LOW
RDW: 17.9 % — ABNORMAL HIGH (ref 11.5–15.5)
RDW: 20.1 — ABNORMAL HIGH

## 2010-10-26 LAB — COMPREHENSIVE METABOLIC PANEL
ALT: 26
AST: 35
Albumin: 4.1 g/dL (ref 3.5–5.2)
Alkaline Phosphatase: 98
BUN: 7 mg/dL (ref 6–23)
CO2: 27
Creatinine, Ser: <0.47 mg/dL — ABNORMAL LOW (ref 0.50–1.35)
Glucose, Bld: 110 — ABNORMAL HIGH
Potassium: 3.8 mEq/L (ref 3.5–5.1)
Potassium: 3.4 — ABNORMAL LOW
Sodium: 141
Total Protein: 8.2 g/dL (ref 6.0–8.3)
Total Protein: 7.2

## 2010-10-26 LAB — RETICULOCYTES
RBC.: 2.27 — ABNORMAL LOW
Retic Count, Absolute: 240.6 — ABNORMAL HIGH

## 2010-10-26 LAB — BASIC METABOLIC PANEL
CO2: 25
Chloride: 103
GFR calc Af Amer: >60
Sodium: 135

## 2010-10-26 LAB — PROTIME-INR: Prothrombin Time: 16.7 seconds — ABNORMAL HIGH (ref 11.6–15.2)

## 2010-10-26 MED ORDER — IOHEXOL 300 MG/ML  SOLN
100.0000 mL | Freq: Once | INTRAMUSCULAR | Status: AC | PRN
  Administered 2010-10-26: 100 mL via INTRAVENOUS

## 2010-10-27 ENCOUNTER — Inpatient Hospital Stay (HOSPITAL_COMMUNITY): Payer: Medicare Other

## 2010-10-27 LAB — CROSSMATCH
ABO/RH(D): O POS
Antibody Screen: NEGATIVE
Unit division: 0
Unit division: 0

## 2010-10-27 LAB — URINE CULTURE

## 2010-10-27 LAB — POCT I-STAT, CHEM 8
Calcium, Ion: 1.25
Chloride: 102
HCT: 32 — ABNORMAL LOW
Hemoglobin: 10.9 — ABNORMAL LOW
Potassium: 3.7

## 2010-10-27 LAB — DIFFERENTIAL
Basophils Absolute: 0
Basophils Relative: 0
Eosinophils Absolute: 0.4
Eosinophils Relative: 2
Monocytes Absolute: 2.2 — ABNORMAL HIGH
Monocytes Relative: 12

## 2010-10-27 LAB — HEPATIC FUNCTION PANEL
ALT: 19
AST: 25
Bilirubin, Direct: 0.3
Indirect Bilirubin: 3.6 — ABNORMAL HIGH
Total Protein: 6.9

## 2010-10-27 LAB — URINALYSIS, ROUTINE W REFLEX MICROSCOPIC
Bilirubin Urine: NEGATIVE
Glucose, UA: NEGATIVE
Ketones, ur: NEGATIVE
Specific Gravity, Urine: 1.014
pH: 6.5

## 2010-10-27 LAB — URINE MICROSCOPIC-ADD ON

## 2010-10-27 LAB — CBC
HCT: 27.5 — ABNORMAL LOW
Hemoglobin: 9.5 — ABNORMAL LOW
MCHC: 34.5
MCV: 102.2 — ABNORMAL HIGH
RDW: 16 — ABNORMAL HIGH

## 2010-10-28 LAB — DIFFERENTIAL
Basophils Absolute: 0
Basophils Relative: 0
Eosinophils Relative: 2
Lymphocytes Relative: 14
Lymphocytes Relative: 9 — ABNORMAL LOW
Lymphocytes Relative: 8 — ABNORMAL LOW
Lymphs Abs: 1.6
Lymphs Abs: 1.8
Monocytes Relative: 9
Monocytes Relative: 12
Neutro Abs: 11.2 — ABNORMAL HIGH
Neutro Abs: 13.9 — ABNORMAL HIGH
Neutro Abs: 18.3 — ABNORMAL HIGH
Neutrophils Relative %: 78 — ABNORMAL HIGH

## 2010-10-28 LAB — COMPREHENSIVE METABOLIC PANEL
BUN: 8 mg/dL (ref 6–23)
Calcium: 9 mg/dL (ref 8.4–10.5)
Creatinine, Ser: <0.47 mg/dL — ABNORMAL LOW (ref 0.50–1.35)
Glucose, Bld: 127 mg/dL — ABNORMAL HIGH (ref 70–99)
Total Protein: 8.4 g/dL — ABNORMAL HIGH (ref 6.0–8.3)

## 2010-10-28 LAB — POCT I-STAT, CHEM 8
BUN: 6
Chloride: 101
HCT: 30 — ABNORMAL LOW
HCT: 31 — ABNORMAL LOW
Hemoglobin: 10.2 — ABNORMAL LOW
Hemoglobin: 10.5 — ABNORMAL LOW
Potassium: 3.4 — ABNORMAL LOW
Sodium: 141
TCO2: 26

## 2010-10-28 LAB — CBC
HCT: 20.1 % — ABNORMAL LOW (ref 39.0–52.0)
HCT: 26.4 — ABNORMAL LOW
HCT: 27.7 — ABNORMAL LOW
Hemoglobin: 7 g/dL — ABNORMAL LOW (ref 13.0–17.0)
Hemoglobin: 8.8 — ABNORMAL LOW
Hemoglobin: 9.4 — ABNORMAL LOW
MCH: 29.5 pg (ref 26.0–34.0)
MCHC: 34.8 g/dL (ref 30.0–36.0)
Platelets: 529 — ABNORMAL HIGH
Platelets: 540 — ABNORMAL HIGH
RBC: 2.78 — ABNORMAL LOW
WBC: 15.2 — ABNORMAL HIGH
WBC: 19.1 — ABNORMAL HIGH
WBC: 23.5 — ABNORMAL HIGH

## 2010-10-28 LAB — LACTATE DEHYDROGENASE: LDH: 308 — ABNORMAL HIGH

## 2010-10-28 LAB — RETICULOCYTES
RBC.: 2.6 — ABNORMAL LOW
RBC.: 2.72 — ABNORMAL LOW
RBC.: 2.78 — ABNORMAL LOW
Retic Count, Absolute: 239.2 — ABNORMAL HIGH
Retic Count, Absolute: 228.5 — ABNORMAL HIGH
Retic Count, Absolute: 219.6 — ABNORMAL HIGH
Retic Ct Pct: 7.9 — ABNORMAL HIGH

## 2010-10-29 LAB — COMPREHENSIVE METABOLIC PANEL
ALT: 18
ALT: 18
AST: 21
AST: 25
Alkaline Phosphatase: 113
CO2: 24
CO2: 27
Chloride: 102
Chloride: 105
Creatinine, Ser: 0.57
GFR calc Af Amer: >60
GFR calc Af Amer: >60
GFR calc non Af Amer: >60
Glucose, Bld: 88
Potassium: 3.2 — ABNORMAL LOW
Potassium: 4.1
Sodium: 138
Total Bilirubin: 3.6 — ABNORMAL HIGH

## 2010-10-29 LAB — DIFFERENTIAL
Basophils Absolute: 0
Basophils Relative: 0
Basophils Relative: 0
Eosinophils Absolute: 0.7
Eosinophils Relative: 6 — ABNORMAL HIGH
Eosinophils Relative: 6 — ABNORMAL HIGH
Lymphocytes Relative: 12
Monocytes Relative: 10
Monocytes Relative: 6
Monocytes Relative: 9
Neutro Abs: 12.8 — ABNORMAL HIGH
Neutrophils Relative %: 71
Neutrophils Relative %: 74
Neutrophils Relative %: 79 — ABNORMAL HIGH

## 2010-10-29 LAB — POCT I-STAT, CHEM 8
BUN: 4 — ABNORMAL LOW
Calcium, Ion: 1.16
Chloride: 106
Glucose, Bld: 98
TCO2: 25

## 2010-10-29 LAB — CBC
HCT: 22.6 — ABNORMAL LOW
HCT: 23.4 — ABNORMAL LOW
HCT: 24.7 — ABNORMAL LOW
HCT: 27.7 — ABNORMAL LOW
Hemoglobin: 8 — ABNORMAL LOW
Hemoglobin: 8.6 — ABNORMAL LOW
MCHC: 33.3
MCHC: 34.1
MCV: 101 — ABNORMAL HIGH
MCV: 101.2 — ABNORMAL HIGH
MCV: 101.3 — ABNORMAL HIGH
Platelets: 523 — ABNORMAL HIGH
Platelets: 569 — ABNORMAL HIGH
Platelets: 606 — ABNORMAL HIGH
Platelets: 655 — ABNORMAL HIGH
RBC: 2.31 — ABNORMAL LOW
RBC: 2.69 — ABNORMAL LOW
RBC: 2.74 — ABNORMAL LOW
RDW: 17.7 — ABNORMAL HIGH
RDW: 18.5 — ABNORMAL HIGH
RDW: 18.5 — ABNORMAL HIGH
WBC: 17.3 — ABNORMAL HIGH
WBC: 17.3 — ABNORMAL HIGH
WBC: 17.6 — ABNORMAL HIGH
WBC: 18.7 — ABNORMAL HIGH
WBC: 19.8 — ABNORMAL HIGH

## 2010-10-29 LAB — BASIC METABOLIC PANEL
BUN: 4 — ABNORMAL LOW
BUN: 4 — ABNORMAL LOW
BUN: 4 — ABNORMAL LOW
CO2: 24
CO2: 25
Calcium: 8.8
Chloride: 104
Chloride: 107
Creatinine, Ser: 0.46
Creatinine, Ser: 0.57
GFR calc Af Amer: >60
GFR calc non Af Amer: >60
GFR calc non Af Amer: >60
Glucose, Bld: 85
Glucose, Bld: 88
Potassium: 3.1 — ABNORMAL LOW
Potassium: 3.4 — ABNORMAL LOW
Potassium: 4
Sodium: 135

## 2010-10-29 LAB — CROSSMATCH
ABO/RH(D): O POS
Antibody Screen: NEGATIVE

## 2010-10-29 LAB — HEMOGLOBINOPATHY EVALUATION
Hgb A2 Quant: 3.2
Hgb A: 17.2 % — ABNORMAL LOW
Hgb S Quant: 72.2 % — ABNORMAL HIGH (ref 0.0–0.0)

## 2010-10-29 LAB — FERRITIN: Ferritin: 745 — ABNORMAL HIGH (ref 22–322)

## 2010-10-29 LAB — POCT CARDIAC MARKERS
Operator id: 284251
Troponin i, poc: <0.05

## 2010-10-29 LAB — RETICULOCYTES
RBC.: 2.45 — ABNORMAL LOW
Retic Count, Absolute: 242.6 — ABNORMAL HIGH

## 2010-10-29 LAB — VITAMIN B12: Vitamin B-12: 345 (ref 211–911)

## 2010-10-29 LAB — MAGNESIUM: Magnesium: 1.9

## 2010-10-29 LAB — TSH: TSH: 4.314

## 2010-10-30 NOTE — H&P (Signed)
[name], [name]             ACCOUNT NO.:  [id number]  MEDICAL RECORD NO.:  [id number]  LOCATION:  WLED                         FACILITY:  [facility]  PHYSICIAN:  Celso Amy, MD   DATE OF BIRTH:  [birth year]  DATE OF ADMISSION:  10/23/2010 DATE OF DISCHARGE:                             HISTORY & PHYSICAL   PRIMARY CARE DOCTOR:  Dr. [name], MD  HEMATOLOGIST:  [name] [name], MD  CHIEF COMPLAINT:  Pain in the ribs.  HISTORY OF PRESENT ILLNESS:  The patient is a 30 year old African American male with a past medical history of sickle cell disease, who presented to the ER with a chief complaint of pain in ribs.  History of present illness dates back to last night when the patient had sudden onset of pain in the chest.  It is bilateral in location, nonprogressive in nature, constant pain.  The patient described it as 7/10.  The patient says this is the same pain that he gets that his sickle cell crisis.  No complaint of shortness of breath.  No pain of cough.  No complaint of fever.  No complaint of recent travel.  No complaint of syncope.  No complaint of nausea, vomiting, or diarrhea.  No complaint of abdominal pain.  REVIEW OF SYSTEMS:  Positive for sneezing.  ALLERGIES:  The patient is allergic to MORPHINE.  SOCIAL HISTORY:  The patient continues to use cigar.  Drinks occasionally.  Denies any IV drug abuse.  FAMILY HISTORY:  The patient's brother has sickle cell trait.  The patient's mother has diabetes mellitus.  PAST MEDICAL HISTORY:  Positive for: 1. Sickle cell disease. 2. Multiple admissions for sickle cell crisis.  The patient had around     7 admissions in 2012. 3. PE 4 years ago. 4. Avascular necrosis. 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 7. Thrombocytosis. 8. Hypokalemia. 9. Anxiety. 10.The patient is status post umbilical hernia repair.  MEDICATIONS:  As an outpatient, the patient says he is on: 1. OxyContin 40 mg p.o.  b.i.d. 2. Dilantin 4 mg p.o. b.i.d. 3. Ibuprofen 800 mg p.o. q.6 h. 4. Ambien 10 mg p.o. daily. 5. The patient is not taking his hydroxyurea as he did not refill it.  PHYSICAL EXAMINATION:  VITAL SIGNS:  Blood pressure 126/95, pulse 75, respiratory rate 18, temperature is afebrile. GENERAL:  The patient is awake, alert, oriented to time, place and person.  He is resting comfortably at the bed.  He is well built. HEENT:  Sclerae are muddy.  Pupils equally reactive to light and accommodation.  Conjunctivae are pale.  Head is atraumatic, normocephalic. RESPIRATORY:  No acute respiratory distress. CHEST:  Clear to auscultation bilaterally. CARDIOVASCULAR:  S1 and S2 are regular in rate and rhythm.  No murmur appreciated. GI:  Bowel sounds are present.  Soft, nontender, nondistended. EXTREMITIES:  No lower extremity edema is seen or cyanosis is seen. CNS:  Cranial nerves II through XII are grossly intact.  The patient is moving all 4 extremities.  The patient has strength 5/5 in both upper and lower extremities. PSYCH:  The patient has normal mood and affect.  LABORATORY DATA:  Sodium 140, potassium 2.7, serum chloride  110, bicarbonate 19, BUN 4, serum creatinine 0.4, glucose 91.  The patient's WBC 23.4.  The patient's hemoglobin is 7.4.  The patient's platelets are 488.  RBC morphology did show sickle cell disease.  The patient's WBCs baseline at around 14-16 and is now 23.  The patient's hemoglobin baseline is 7.5 to 8.5.  Chest x-ray showed cardiac enlargement with vascular congestion.  It also showed that Port-A-Cath was pulled back as compared to previous chest x-ray.  IMPRESSION: 1. Sickle cell crisis.  The patient is being admitted for sickle cell     crisis.  The patient has had a similar admission in the past. 2. Anemia.  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  The patient's baseline hemoglobin is anywhere from 7.5 to     8.5. 3. Fluids,  electrolytes, and nutrition.  The patient is hypokalemic.     The patient has a history of hypokalemia.  This hypokalemia is most     likely because of sickle disease affect on renal concentrating     ability. 4. Leukocytosis.  The patient has reactive leukocytosis.  There are no     overt signs of infection.  The patient's WBCs are higher than the     baseline.  We will follow CBC.  In case the patient's chest x-ray     does not show any infection, we will do UA.  In case, the patient     develops fever, we will start the patient on IV antibiotics at this     time.  The patient does not appear to be infected.  There are no     signs or symptoms suggestive of infection. 5. Coiled Port-A-Cath.  The patient has coiled Port-A-Cath.  I have     tried to get in touch with Interventional Radiology, but so far I     have not been able to get in contact with Interventional Radiology.     The patient will need a consult in the morning to see if this can     be starighten this down with a wire. 6. Deep venous thrombosis.  The patient will need deep venous     thrombosis prophylaxis. 7. Cardiology.  The patient's chest x-ray showing some vascular     congestion.  The patient's last 2-D echocardiogram was in 2011.  We     will repeat a 2-D echocardiogram.  PLAN: 1. We will admit the patient to Dr. Diamantina Providence service. 2. We will start the patient on IV fluids. 3. We will use PCA pump for pain control. 4. We will continue the patient's outpatient of OxyContin and folic     acid. 5. The patient will need interventional radiology consult in the     morning. 6. We will get a 2-D echocardiogram to look at patient's EF. 7. We will replete the patient's potassium. 8. We will follow the patient's CBC to see if his WBCs are better and     to follow his hemoglobin. 9. We will follow the patient's BMET to see if his potassium is     better.  We will keep the patient on DVT prophylaxis.     Celso Amy, MD     MB/MEDQ  D:  10/23/2010  T:  10/23/2010  Job:  [id number]  Electronically Signed by Celso Amy M.D. on 10/30/2010 09:03:53 PM

## 2010-11-01 LAB — CULTURE, BLOOD (ROUTINE X 2)
Culture  Setup Time: 201209250836
Culture: NO GROWTH
Culture: NO GROWTH

## 2010-11-02 LAB — RETICULOCYTES
RBC.: 2.36 — ABNORMAL LOW
Retic Count, Absolute: 217.1 — ABNORMAL HIGH
Retic Ct Pct: 9.2 — ABNORMAL HIGH

## 2010-11-02 LAB — HEPATIC FUNCTION PANEL
AST: 35
Bilirubin, Direct: 0.5 — ABNORMAL HIGH
Total Bilirubin: 3.8 — ABNORMAL HIGH

## 2010-11-02 LAB — DIFFERENTIAL
Basophils Absolute: 0
Basophils Relative: 0
Eosinophils Absolute: 0.2
Eosinophils Relative: 1
Lymphocytes Relative: 9 — ABNORMAL LOW
Lymphs Abs: 1.7
Monocytes Absolute: 2.1 — ABNORMAL HIGH
Monocytes Relative: 11
Neutro Abs: 15.4 — ABNORMAL HIGH
Neutrophils Relative %: 79 — ABNORMAL HIGH

## 2010-11-02 LAB — CBC
HCT: 25.1 — ABNORMAL LOW
Hemoglobin: 8.3 — ABNORMAL LOW
MCHC: 33
MCHC: 34
MCV: 104.6 — ABNORMAL HIGH
Platelets: 418 — ABNORMAL HIGH
RBC: 2.25 — ABNORMAL LOW
RBC: 2.4 — ABNORMAL LOW
RDW: 18 — ABNORMAL HIGH
RDW: 18.3 — ABNORMAL HIGH
WBC: 19.4 — ABNORMAL HIGH

## 2010-11-02 LAB — BASIC METABOLIC PANEL
BUN: 9
CO2: 24
Calcium: 8.5
Chloride: 106
Creatinine, Ser: 0.79
GFR calc Af Amer: >60
GFR calc non Af Amer: >60
Glucose, Bld: 101 — ABNORMAL HIGH
Potassium: 3.6
Sodium: 139

## 2010-11-02 LAB — POCT I-STAT, CHEM 8
Calcium, Ion: 1.22
Creatinine, Ser: 0.6
Glucose, Bld: 94
Hemoglobin: 9.9 — ABNORMAL LOW
TCO2: 27

## 2010-11-02 LAB — LIPID PANEL
Cholesterol: 82
HDL: 26 — ABNORMAL LOW
LDL Cholesterol: 42
Total CHOL/HDL Ratio: 3.2

## 2010-11-02 LAB — COMPREHENSIVE METABOLIC PANEL
ALT: 32
AST: 27
Albumin: 3.8
CO2: 25
Calcium: 8.6
GFR calc Af Amer: >60
Sodium: 139
Total Protein: 6.5

## 2010-11-02 LAB — PROTIME-INR
INR: 1.1
Prothrombin Time: 14.8

## 2010-11-02 LAB — CULTURE, BLOOD (ROUTINE X 2)
Culture: NO GROWTH
Culture: NO GROWTH

## 2010-11-02 LAB — CARDIAC PANEL(CRET KIN+CKTOT+MB+TROPI): CK, MB: 0.6

## 2010-11-02 LAB — CK TOTAL AND CKMB (NOT AT ARMC): CK, MB: 0.7

## 2010-11-03 LAB — COMPREHENSIVE METABOLIC PANEL
ALT: 26
Albumin: 4.3
Alkaline Phosphatase: 105
BUN: 6
Chloride: 106
Glucose, Bld: 96
Potassium: 3.3 — ABNORMAL LOW
Sodium: 138
Total Bilirubin: 4.4 — ABNORMAL HIGH

## 2010-11-03 LAB — CBC
HCT: 25.3 — ABNORMAL LOW
HCT: 27.4 — ABNORMAL LOW
Hemoglobin: 8.6 — ABNORMAL LOW
Hemoglobin: 9.1 — ABNORMAL LOW
Platelets: 446 — ABNORMAL HIGH
RBC: 2.51 — ABNORMAL LOW
WBC: 13.9 — ABNORMAL HIGH
WBC: 16.7 — ABNORMAL HIGH

## 2010-11-03 LAB — DIFFERENTIAL
Basophils Absolute: 0
Basophils Relative: 0
Eosinophils Absolute: 0.4
Eosinophils Relative: 3
Lymphs Abs: 2.5
Monocytes Absolute: 1
Monocytes Relative: 12
Neutro Abs: 13.1 — ABNORMAL HIGH
Neutro Abs: 9.3 — ABNORMAL HIGH

## 2010-11-03 LAB — RETICULOCYTES
RBC.: 2.46 — ABNORMAL LOW
Retic Count, Absolute: 142.7
Retic Count, Absolute: 170.3

## 2010-11-05 LAB — CBC
HCT: 21.2 % — ABNORMAL LOW (ref 39.0–52.0)
HCT: 22.1 % — ABNORMAL LOW (ref 39.0–52.0)
HCT: 22.7 % — ABNORMAL LOW (ref 39.0–52.0)
HCT: 22.9 % — ABNORMAL LOW (ref 39.0–52.0)
HCT: 23 % — ABNORMAL LOW (ref 39.0–52.0)
HCT: 23.3 % — ABNORMAL LOW (ref 39.0–52.0)
HCT: 24.5 % — ABNORMAL LOW (ref 39.0–52.0)
HCT: 25.1 % — ABNORMAL LOW (ref 39.0–52.0)
HCT: 27.1 % — ABNORMAL LOW (ref 39.0–52.0)
Hemoglobin: 7 g/dL — CL (ref 13.0–17.0)
Hemoglobin: 7.5 g/dL — CL (ref 13.0–17.0)
Hemoglobin: 7.6 g/dL — CL (ref 13.0–17.0)
Hemoglobin: 7.6 g/dL — CL (ref 13.0–17.0)
MCHC: 33.8 g/dL (ref 30.0–36.0)
MCHC: 34.4 g/dL (ref 30.0–36.0)
MCHC: 34.8 g/dL (ref 30.0–36.0)
MCHC: 35.1 g/dL (ref 30.0–36.0)
MCV: 100.4 fL — ABNORMAL HIGH (ref 78.0–100.0)
MCV: 102.4 fL — ABNORMAL HIGH (ref 78.0–100.0)
MCV: 102.6 fL — ABNORMAL HIGH (ref 78.0–100.0)
MCV: 103.1 fL — ABNORMAL HIGH (ref 78.0–100.0)
MCV: 97.1 fL (ref 78.0–100.0)
MCV: 97.3 fL (ref 78.0–100.0)
MCV: 97.6 fL (ref 78.0–100.0)
MCV: 97.8 fL (ref 78.0–100.0)
MCV: 98 fL (ref 78.0–100.0)
MCV: 99.1 fL (ref 78.0–100.0)
Platelets: 486 10*3/uL — ABNORMAL HIGH (ref 150–400)
Platelets: 512 10*3/uL — ABNORMAL HIGH (ref 150–400)
Platelets: 532 10*3/uL — ABNORMAL HIGH (ref 150–400)
Platelets: 550 10*3/uL — ABNORMAL HIGH (ref 150–400)
Platelets: 559 10*3/uL — ABNORMAL HIGH (ref 150–400)
Platelets: 563 10*3/uL — ABNORMAL HIGH (ref 150–400)
RBC: 2.23 MIL/uL — ABNORMAL LOW (ref 4.22–5.81)
RBC: 2.3 MIL/uL — ABNORMAL LOW (ref 4.22–5.81)
RBC: 2.32 MIL/uL — ABNORMAL LOW (ref 4.22–5.81)
RBC: 2.35 MIL/uL — ABNORMAL LOW (ref 4.22–5.81)
RBC: 2.36 MIL/uL — ABNORMAL LOW (ref 4.22–5.81)
RBC: 2.39 MIL/uL — ABNORMAL LOW (ref 4.22–5.81)
RBC: 2.58 MIL/uL — ABNORMAL LOW (ref 4.22–5.81)
RDW: 14.8 % (ref 11.5–15.5)
RDW: 15 % (ref 11.5–15.5)
RDW: 15.7 % — ABNORMAL HIGH (ref 11.5–15.5)
RDW: 16.7 % — ABNORMAL HIGH (ref 11.5–15.5)
RDW: 17.5 % — ABNORMAL HIGH (ref 11.5–15.5)
WBC: 18.6 10*3/uL — ABNORMAL HIGH (ref 4.0–10.5)
WBC: 19 10*3/uL — ABNORMAL HIGH (ref 4.0–10.5)
WBC: 19.5 10*3/uL — ABNORMAL HIGH (ref 4.0–10.5)
WBC: 20.2 10*3/uL — ABNORMAL HIGH (ref 4.0–10.5)
WBC: 21.4 10*3/uL — ABNORMAL HIGH (ref 4.0–10.5)
WBC: 21.4 10*3/uL — ABNORMAL HIGH (ref 4.0–10.5)
WBC: 23.6 10*3/uL — ABNORMAL HIGH (ref 4.0–10.5)
WBC: 25.5 10*3/uL — ABNORMAL HIGH (ref 4.0–10.5)

## 2010-11-05 LAB — POCT I-STAT, CHEM 8
BUN: 5 mg/dL — ABNORMAL LOW (ref 6–23)
Calcium, Ion: 1.01 mmol/L — ABNORMAL LOW (ref 1.12–1.32)
Chloride: 107 mEq/L (ref 96–112)
Creatinine, Ser: 0.7 mg/dL (ref 0.4–1.5)
Glucose, Bld: 91 mg/dL (ref 70–99)
TCO2: 25 mmol/L (ref 0–100)

## 2010-11-05 LAB — CROSSMATCH: ABO/RH(D): O POS

## 2010-11-05 LAB — BASIC METABOLIC PANEL
BUN: 4 mg/dL — ABNORMAL LOW (ref 6–23)
BUN: 5 mg/dL — ABNORMAL LOW (ref 6–23)
BUN: 6 mg/dL (ref 6–23)
BUN: 6 mg/dL (ref 6–23)
CO2: 24 mEq/L (ref 19–32)
CO2: 26 mEq/L (ref 19–32)
CO2: 26 mEq/L (ref 19–32)
Calcium: 9.1 mg/dL (ref 8.4–10.5)
Calcium: 9.4 mg/dL (ref 8.4–10.5)
Chloride: 103 mEq/L (ref 96–112)
Chloride: 105 mEq/L (ref 96–112)
Chloride: 107 mEq/L (ref 96–112)
Chloride: 109 mEq/L (ref 96–112)
Creatinine, Ser: 0.5 mg/dL (ref 0.4–1.5)
Creatinine, Ser: 0.52 mg/dL (ref 0.4–1.5)
Creatinine, Ser: 0.65 mg/dL (ref 0.4–1.5)
GFR calc Af Amer: >60 mL/min (ref >60)
GFR calc non Af Amer: >60 mL/min (ref >60)
GFR calc non Af Amer: >60 mL/min (ref >60)
GFR calc non Af Amer: >60 mL/min (ref >60)
Glucose, Bld: 110 mg/dL — ABNORMAL HIGH (ref 70–99)
Glucose, Bld: 85 mg/dL (ref 70–99)
Glucose, Bld: 88 mg/dL (ref 70–99)
Potassium: 3.5 mEq/L (ref 3.5–5.1)
Potassium: 3.7 mEq/L (ref 3.5–5.1)
Sodium: 139 mEq/L (ref 135–145)
Sodium: 141 mEq/L (ref 135–145)

## 2010-11-05 LAB — HEPATIC FUNCTION PANEL
ALT: 16 U/L (ref 0–53)
ALT: 17 U/L (ref 0–53)
Albumin: 4.3 g/dL (ref 3.5–5.2)
Alkaline Phosphatase: 87 U/L (ref 39–117)
Bilirubin, Direct: 0.6 mg/dL — ABNORMAL HIGH (ref 0.0–0.3)
Indirect Bilirubin: 5.7 mg/dL — ABNORMAL HIGH (ref 0.3–0.9)
Indirect Bilirubin: 6.7 mg/dL — ABNORMAL HIGH (ref 0.3–0.9)
Total Bilirubin: 6.3 mg/dL — ABNORMAL HIGH (ref 0.3–1.2)
Total Protein: 7.7 g/dL (ref 6.0–8.3)

## 2010-11-05 LAB — COMPREHENSIVE METABOLIC PANEL
ALT: 21 U/L (ref 0–53)
ALT: 35 U/L (ref 0–53)
AST: 23 U/L (ref 0–37)
AST: 29 U/L (ref 0–37)
AST: 35 U/L (ref 0–37)
AST: 42 U/L — ABNORMAL HIGH (ref 0–37)
AST: 73 U/L — ABNORMAL HIGH (ref 0–37)
Albumin: 4.2 g/dL (ref 3.5–5.2)
Alkaline Phosphatase: 114 U/L (ref 39–117)
Alkaline Phosphatase: 120 U/L — ABNORMAL HIGH (ref 39–117)
Alkaline Phosphatase: 124 U/L — ABNORMAL HIGH (ref 39–117)
Alkaline Phosphatase: 91 U/L (ref 39–117)
Alkaline Phosphatase: 92 U/L (ref 39–117)
BUN: 4 mg/dL — ABNORMAL LOW (ref 6–23)
BUN: 5 mg/dL — ABNORMAL LOW (ref 6–23)
BUN: 6 mg/dL (ref 6–23)
CO2: 22 mEq/L (ref 19–32)
CO2: 25 mEq/L (ref 19–32)
CO2: 26 mEq/L (ref 19–32)
CO2: 27 mEq/L (ref 19–32)
Calcium: 8.8 mg/dL (ref 8.4–10.5)
Calcium: 9.2 mg/dL (ref 8.4–10.5)
Calcium: 9.2 mg/dL (ref 8.4–10.5)
Calcium: 9.3 mg/dL (ref 8.4–10.5)
Chloride: 100 mEq/L (ref 96–112)
Chloride: 104 mEq/L (ref 96–112)
Chloride: 105 mEq/L (ref 96–112)
Creatinine, Ser: 0.5 mg/dL (ref 0.4–1.5)
Creatinine, Ser: 0.51 mg/dL (ref 0.4–1.5)
GFR calc Af Amer: >60 mL/min (ref >60)
GFR calc Af Amer: >60 mL/min (ref >60)
GFR calc Af Amer: >60 mL/min (ref >60)
GFR calc Af Amer: >60 mL/min (ref >60)
GFR calc non Af Amer: >60 mL/min (ref >60)
GFR calc non Af Amer: >60 mL/min (ref >60)
GFR calc non Af Amer: >60 mL/min (ref >60)
Glucose, Bld: 106 mg/dL — ABNORMAL HIGH (ref 70–99)
Glucose, Bld: 36 mg/dL — CL (ref 70–99)
Glucose, Bld: 94 mg/dL (ref 70–99)
Potassium: 3.2 mEq/L — ABNORMAL LOW (ref 3.5–5.1)
Potassium: 3.8 mEq/L (ref 3.5–5.1)
Potassium: 4.1 mEq/L (ref 3.5–5.1)
Potassium: 4.2 mEq/L (ref 3.5–5.1)
Potassium: 5.1 mEq/L (ref 3.5–5.1)
Sodium: 137 mEq/L (ref 135–145)
Sodium: 139 mEq/L (ref 135–145)
Total Bilirubin: 6.6 mg/dL — ABNORMAL HIGH (ref 0.3–1.2)
Total Bilirubin: 6.9 mg/dL — ABNORMAL HIGH (ref 0.3–1.2)
Total Bilirubin: 7.3 mg/dL — ABNORMAL HIGH (ref 0.3–1.2)
Total Protein: 6.8 g/dL (ref 6.0–8.3)
Total Protein: 7.5 g/dL (ref 6.0–8.3)
Total Protein: 7.7 g/dL (ref 6.0–8.3)

## 2010-11-05 LAB — RETICULOCYTES
RBC.: 2.15 MIL/uL — ABNORMAL LOW (ref 4.22–5.81)
RBC.: 2.26 MIL/uL — ABNORMAL LOW (ref 4.22–5.81)
RBC.: 2.62 MIL/uL — ABNORMAL LOW (ref 4.22–5.81)
Retic Count, Absolute: 254.1 K/uL — ABNORMAL HIGH (ref 19.0–186.0)
Retic Count, Absolute: 260 10*3/uL — ABNORMAL HIGH (ref 19.0–186.0)
Retic Ct Pct: 10.4 % — ABNORMAL HIGH (ref 0.4–3.1)
Retic Ct Pct: 7.7 % — ABNORMAL HIGH (ref 0.4–3.1)
Retic Ct Pct: 9.7 % — ABNORMAL HIGH (ref 0.4–3.1)

## 2010-11-05 LAB — URINALYSIS, ROUTINE W REFLEX MICROSCOPIC
Bilirubin Urine: NEGATIVE
Bilirubin Urine: NEGATIVE
Bilirubin Urine: NEGATIVE
Glucose, UA: NEGATIVE mg/dL
Hgb urine dipstick: NEGATIVE
Ketones, ur: NEGATIVE mg/dL
Ketones, ur: NEGATIVE mg/dL
Nitrite: NEGATIVE
Protein, ur: NEGATIVE mg/dL
Protein, ur: NEGATIVE mg/dL
Specific Gravity, Urine: 1.014 (ref 1.005–1.030)
Urobilinogen, UA: 1 mg/dL (ref 0.0–1.0)
Urobilinogen, UA: 2 mg/dL — ABNORMAL HIGH (ref 0.0–1.0)
Urobilinogen, UA: 2 mg/dL — ABNORMAL HIGH (ref 0.0–1.0)
pH: 6 (ref 5.0–8.0)

## 2010-11-05 LAB — LIPASE, BLOOD: Lipase: 30 U/L (ref 11–59)

## 2010-11-05 LAB — URINE CULTURE
Colony Count: NO GROWTH
Culture: NO GROWTH
Special Requests: NEGATIVE

## 2010-11-05 LAB — CULTURE, BLOOD (ROUTINE X 2): Culture: NO GROWTH

## 2010-11-05 LAB — DIFFERENTIAL
Band Neutrophils: 0 % (ref 0–10)
Basophils Absolute: 0 10*3/uL (ref 0.0–0.1)
Basophils Relative: 0 % (ref 0–1)
Basophils Relative: 0 % (ref 0–1)
Eosinophils Absolute: 0 10*3/uL (ref 0.0–0.7)
Eosinophils Absolute: 0.5 10*3/uL (ref 0.0–0.7)
Eosinophils Absolute: 0.8 10*3/uL — ABNORMAL HIGH (ref 0.0–0.7)
Eosinophils Relative: 0 % (ref 0–5)
Eosinophils Relative: 3 % (ref 0–5)
Lymphs Abs: 4 10*3/uL (ref 0.7–4.0)
Metamyelocytes Relative: 0 %
Monocytes Absolute: 1.2 10*3/uL — ABNORMAL HIGH (ref 0.1–1.0)
Monocytes Relative: 12 % (ref 3–12)
Monocytes Relative: 6 % (ref 3–12)
Myelocytes: 0 %
Neutro Abs: 12.6 10*3/uL — ABNORMAL HIGH (ref 1.7–7.7)
Neutrophils Relative %: 63 % (ref 43–77)
Neutrophils Relative %: 74 % (ref 43–77)
nRBC: 0 /100 WBC

## 2010-11-05 LAB — POCT CARDIAC MARKERS
CKMB, poc: <1 ng/mL — ABNORMAL LOW (ref 1.0–8.0)
Myoglobin, poc: 23.9 ng/mL (ref 12–200)
Troponin i, poc: <0.05 ng/mL (ref 0.00–0.09)

## 2010-11-05 LAB — TROPONIN I: Troponin I: 0.02 ng/mL (ref 0.00–0.06)

## 2010-11-05 LAB — URINE MICROSCOPIC-ADD ON

## 2010-11-05 LAB — CARDIAC PANEL(CRET KIN+CKTOT+MB+TROPI)
CK, MB: 0.7 ng/mL (ref 0.3–4.0)
Relative Index: INVALID (ref 0.0–2.5)
Relative Index: INVALID (ref 0.0–2.5)
Troponin I: 0.02 ng/mL (ref 0.00–0.06)
Troponin I: 0.03 ng/mL (ref 0.00–0.06)

## 2010-11-05 LAB — GLUCOSE, CAPILLARY: Glucose-Capillary: 93 mg/dL (ref 70–99)

## 2010-11-05 LAB — AMYLASE: Amylase: 61 U/L (ref 27–131)

## 2010-11-05 LAB — CK TOTAL AND CKMB (NOT AT ARMC): CK, MB: 0.5 ng/mL (ref 0.3–4.0)

## 2010-11-05 LAB — MAGNESIUM: Magnesium: 1.9 mg/dL (ref 1.5–2.5)

## 2010-11-05 LAB — CULTURE, ROUTINE-ABSCESS

## 2010-11-07 ENCOUNTER — Emergency Department (HOSPITAL_COMMUNITY)
Admission: EM | Admit: 2010-11-07 | Discharge: 2010-11-07 | Disposition: A | Discharge status: Home / Self Care | Payer: Medicare Other | Source: Home / Self Care | Attending: Emergency Medicine | Admitting: Emergency Medicine

## 2010-11-07 ENCOUNTER — Ambulatory Visit (HOSPITAL_COMMUNITY)
Admission: RE | Admit: 2010-11-07 | Discharge: 2010-11-07 | Disposition: A | Discharge status: Home / Self Care | Payer: Medicare Other | Source: Ambulatory Visit | Attending: Emergency Medicine | Admitting: Emergency Medicine

## 2010-11-07 ENCOUNTER — Inpatient Hospital Stay (HOSPITAL_COMMUNITY)
Admission: EM | Admit: 2010-11-07 | Discharge: 2010-11-14 | DRG: 812 | Disposition: A | Discharge status: Home / Self Care | Payer: Medicare Other | Attending: Internal Medicine | Admitting: Internal Medicine

## 2010-11-07 DIAGNOSIS — M87029 Idiopathic aseptic necrosis of unspecified humerus: Secondary | ICD-10-CM | POA: Insufficient documentation

## 2010-11-07 DIAGNOSIS — D5700 Hb-SS disease with crisis, unspecified: Principal | ICD-10-CM | POA: Diagnosis present

## 2010-11-07 DIAGNOSIS — Z86718 Personal history of other venous thrombosis and embolism: Secondary | ICD-10-CM

## 2010-11-07 DIAGNOSIS — R791 Abnormal coagulation profile: Secondary | ICD-10-CM | POA: Diagnosis present

## 2010-11-07 DIAGNOSIS — E876 Hypokalemia: Secondary | ICD-10-CM | POA: Diagnosis present

## 2010-11-07 DIAGNOSIS — R079 Chest pain, unspecified: Secondary | ICD-10-CM | POA: Insufficient documentation

## 2010-11-07 DIAGNOSIS — Z79899 Other long term (current) drug therapy: Secondary | ICD-10-CM

## 2010-11-07 LAB — CBC
MCH: 30.4 pg (ref 26.0–34.0)
MCHC: 33 g/dL (ref 30.0–36.0)
Platelets: 780 10*3/uL — ABNORMAL HIGH (ref 150–400)
RBC: 2.4 MIL/uL — ABNORMAL LOW (ref 4.22–5.81)
RDW: 21.7 % — ABNORMAL HIGH (ref 11.5–15.5)

## 2010-11-07 LAB — DIFFERENTIAL
Basophils Absolute: 0.2 10*3/uL — ABNORMAL HIGH (ref 0.0–0.1)
Eosinophils Absolute: 0.7 10*3/uL (ref 0.0–0.7)
Lymphocytes Relative: 12 % (ref 12–46)
Monocytes Absolute: 2.6 10*3/uL — ABNORMAL HIGH (ref 0.1–1.0)
Neutro Abs: 15.6 10*3/uL — ABNORMAL HIGH (ref 1.7–7.7)
Neutrophils Relative %: 72 % (ref 43–77)

## 2010-11-07 LAB — RETICULOCYTES: Retic Ct Pct: 15.6 % — ABNORMAL HIGH (ref 0.4–3.1)

## 2010-11-08 LAB — CBC
MCH: 30.8 pg (ref 26.0–34.0)
MCHC: 33.5 g/dL (ref 30.0–36.0)
MCV: 92.1 fL (ref 78.0–100.0)
Platelets: 425 — ABNORMAL HIGH
Platelets: 682 10*3/uL — ABNORMAL HIGH (ref 150–400)
RBC: 2.84 — ABNORMAL LOW
RDW: 22.6 % — ABNORMAL HIGH (ref 11.5–15.5)
WBC: 16.4 — ABNORMAL HIGH
WBC: 20.7 10*3/uL — ABNORMAL HIGH (ref 4.0–10.5)

## 2010-11-08 LAB — BASIC METABOLIC PANEL
BUN: 3 mg/dL — ABNORMAL LOW (ref 6–23)
BUN: 7
Calcium: 8.3 mg/dL — ABNORMAL LOW (ref 8.4–10.5)
Calcium: 8.8
Creatinine, Ser: 0.51
Creatinine, Ser: <0.47 mg/dL — ABNORMAL LOW (ref 0.50–1.35)
GFR calc Af Amer: >60

## 2010-11-08 LAB — DIFFERENTIAL
Basophils Relative: 0
Lymphs Abs: 3.2
Monocytes Relative: 14 — ABNORMAL HIGH
Neutro Abs: 10.3 — ABNORMAL HIGH
Neutrophils Relative %: 63

## 2010-11-08 LAB — RETICULOCYTES
RBC.: 2.83 — ABNORMAL LOW
Retic Ct Pct: 10.1 — ABNORMAL HIGH

## 2010-11-08 LAB — ELECTROLYTE PANEL
Potassium: 3 mEq/L — ABNORMAL LOW (ref 3.5–5.1)
Sodium: 136 mEq/L (ref 135–145)

## 2010-11-08 LAB — D-DIMER, QUANTITATIVE: D-Dimer, Quant: 1.3 — ABNORMAL HIGH

## 2010-11-09 LAB — DIFFERENTIAL
Basophils Absolute: 0
Basophils Absolute: 0
Basophils Absolute: 0.1
Basophils Relative: 0
Eosinophils Absolute: 0.3
Eosinophils Absolute: 1 — ABNORMAL HIGH
Eosinophils Relative: 5
Lymphocytes Relative: 5 — ABNORMAL LOW
Lymphs Abs: 1.6
Monocytes Absolute: 1.4 — ABNORMAL HIGH
Monocytes Absolute: 2.4 — ABNORMAL HIGH
Monocytes Relative: 6
Neutro Abs: 22.8 — ABNORMAL HIGH

## 2010-11-09 LAB — BASIC METABOLIC PANEL
BUN: 5 — ABNORMAL LOW
BUN: 8
BUN: 8
CO2: 26
CO2: 23
CO2: 26
Calcium: 9.1
Calcium: 9.2
Calcium: 8.8
Chloride: 102
Chloride: 107
Creatinine, Ser: 0.66
Creatinine, Ser: 0.58
GFR calc Af Amer: >60
GFR calc Af Amer: >60
GFR calc Af Amer: >60
GFR calc non Af Amer: >60
GFR calc non Af Amer: >60
GFR calc non Af Amer: >60
GFR calc non Af Amer: >60
Glucose, Bld: 106 — ABNORMAL HIGH
Glucose, Bld: 97
Potassium: 3.9
Potassium: 4.3
Potassium: 3.3 — ABNORMAL LOW
Sodium: 135
Sodium: 136
Sodium: 134 — ABNORMAL LOW

## 2010-11-09 LAB — CBC
HCT: 23.6 % — ABNORMAL LOW (ref 39.0–52.0)
HCT: 23.4 — ABNORMAL LOW
HCT: 24 — ABNORMAL LOW
HCT: 24.3 — ABNORMAL LOW
HCT: 25.7 — ABNORMAL LOW
HCT: 27 — ABNORMAL LOW
HCT: 23.6 — ABNORMAL LOW
HCT: 26.3 — ABNORMAL LOW
HCT: 24.7 % — ABNORMAL LOW (ref 39.0–52.0)
Hemoglobin: 8.1 — ABNORMAL LOW
Hemoglobin: 8.3 — ABNORMAL LOW
Hemoglobin: 9.3 — ABNORMAL LOW
Hemoglobin: 8.2 — ABNORMAL LOW
Hemoglobin: 8.7 — ABNORMAL LOW
Hemoglobin: 8.4 g/dL — ABNORMAL LOW (ref 13.0–17.0)
MCHC: 33.1 g/dL (ref 30.0–36.0)
MCHC: 34.2
MCHC: 34.2
MCHC: 34.6
MCHC: 34.8
MCHC: 34
MCHC: 34.6
MCHC: 33.1
MCV: 89.8
MCV: 91.4
MCV: 93.7
MCV: 96.7
MCV: 96.7
Platelets: 679 10*3/uL — ABNORMAL HIGH (ref 150–400)
Platelets: 472 — ABNORMAL HIGH
Platelets: 582 — ABNORMAL HIGH
Platelets: 466 — ABNORMAL HIGH
Platelets: 537 — ABNORMAL HIGH
RBC: 2.26 — ABNORMAL LOW
RBC: 2.57 — ABNORMAL LOW
RBC: 2.62 — ABNORMAL LOW
RBC: 2.44 — ABNORMAL LOW
RBC: 2.67 MIL/uL — ABNORMAL LOW (ref 4.22–5.81)
RDW: 23.2 % — ABNORMAL HIGH (ref 11.5–15.5)
RDW: 21.7 — ABNORMAL HIGH
RDW: 22.2 — ABNORMAL HIGH
RDW: 20.5 — ABNORMAL HIGH
WBC: 15 10*3/uL — ABNORMAL HIGH (ref 4.0–10.5)
WBC: 18.6 — ABNORMAL HIGH
WBC: 19.2 — ABNORMAL HIGH
WBC: 20.6 — ABNORMAL HIGH
WBC: 26 — ABNORMAL HIGH
WBC: 15 10*3/uL — ABNORMAL HIGH (ref 4.0–10.5)

## 2010-11-09 LAB — COMPREHENSIVE METABOLIC PANEL
ALT: 15 U/L (ref 0–53)
Alkaline Phosphatase: 106 U/L (ref 39–117)
BUN: 3 mg/dL — ABNORMAL LOW (ref 6–23)
BUN: 4 — ABNORMAL LOW
CO2: 20 mEq/L (ref 19–32)
CO2: 26
Calcium: 8.8
Chloride: 104
Creatinine, Ser: 0.49
GFR calc non Af Amer: >60
Glucose, Bld: 91 mg/dL (ref 70–99)
Glucose, Bld: 111 — ABNORMAL HIGH
Potassium: 3.8 mEq/L (ref 3.5–5.1)
Sodium: 138 mEq/L (ref 135–145)
Total Bilirubin: 3.8 — ABNORMAL HIGH
Total Protein: 7.2 g/dL (ref 6.0–8.3)

## 2010-11-09 LAB — HEPATITIS C ANTIBODY: HCV Ab: NEGATIVE

## 2010-11-09 LAB — PROTIME-INR
INR: 1.1
INR: 1.5
INR: 1.5
INR: 1.91 — ABNORMAL HIGH (ref 0.00–1.49)
Prothrombin Time: 18.8 — ABNORMAL HIGH
Prothrombin Time: 14.8
Prothrombin Time: 22.2 seconds — ABNORMAL HIGH (ref 11.6–15.2)

## 2010-11-09 LAB — CROSSMATCH: ABO/RH(D): O POS

## 2010-11-09 LAB — POCT CARDIAC MARKERS
Operator id: 4295
Troponin i, poc: <0.05

## 2010-11-09 LAB — URINALYSIS, ROUTINE W REFLEX MICROSCOPIC
Bilirubin Urine: NEGATIVE
Glucose, UA: NEGATIVE
Hgb urine dipstick: NEGATIVE
Ketones, ur: NEGATIVE
Nitrite: NEGATIVE
pH: 6.5

## 2010-11-09 LAB — POCT I-STAT, CHEM 8
Chloride: 113 mEq/L — ABNORMAL HIGH (ref 96–112)
Glucose, Bld: 96 mg/dL (ref 70–99)
HCT: 25 % — ABNORMAL LOW (ref 39.0–52.0)
Hemoglobin: 8.5 g/dL — ABNORMAL LOW (ref 13.0–17.0)
Potassium: 3.1 mEq/L — ABNORMAL LOW (ref 3.5–5.1)
Sodium: 143 mEq/L (ref 135–145)

## 2010-11-09 LAB — RETICULOCYTES: Retic Count, Absolute: 228.8 — ABNORMAL HIGH

## 2010-11-10 LAB — CBC
HCT: 28 — ABNORMAL LOW
Hemoglobin: 7.8 g/dL — ABNORMAL LOW (ref 13.0–17.0)
MCH: 30.8 pg (ref 26.0–34.0)
MCV: 93.3 fL (ref 78.0–100.0)
MCV: 93.3
Platelets: 577 — ABNORMAL HIGH
RBC: 2.53 MIL/uL — ABNORMAL LOW (ref 4.22–5.81)
RDW: 16.9 — ABNORMAL HIGH
WBC: 15.7 10*3/uL — ABNORMAL HIGH (ref 4.0–10.5)
WBC: 24.5 — ABNORMAL HIGH

## 2010-11-10 LAB — DIFFERENTIAL
Basophils Absolute: 0
Basophils Relative: 0
Eosinophils Absolute: 0
Eosinophils Relative: 0
Lymphocytes Relative: 5 — ABNORMAL LOW
Lymphs Abs: 1.2
Monocytes Absolute: 2.2 — ABNORMAL HIGH
Monocytes Relative: 9
Neutro Abs: 21.1 — ABNORMAL HIGH
Neutrophils Relative %: 86 — ABNORMAL HIGH

## 2010-11-10 LAB — COMPREHENSIVE METABOLIC PANEL
ALT: 16 U/L (ref 0–53)
AST: 35
Albumin: 3.4 g/dL — ABNORMAL LOW (ref 3.5–5.2)
Albumin: 3.4 — ABNORMAL LOW
Alkaline Phosphatase: 108 U/L (ref 39–117)
BUN: 6 mg/dL (ref 6–23)
BUN: 5 — ABNORMAL LOW
Chloride: 108 mEq/L (ref 96–112)
Chloride: 102
Creatinine, Ser: 0.47
GFR calc Af Amer: >90 mL/min (ref >90)
GFR calc Af Amer: >60
Glucose, Bld: 83 mg/dL (ref 70–99)
Potassium: 4.1 mEq/L (ref 3.5–5.1)
Sodium: 138 mEq/L (ref 135–145)
Total Bilirubin: 1.9 mg/dL — ABNORMAL HIGH (ref 0.3–1.2)
Total Protein: 7.2 g/dL (ref 6.0–8.3)
Total Protein: 7.4

## 2010-11-10 LAB — CULTURE, BLOOD (ROUTINE X 2)
Culture: NO GROWTH
Culture: NO GROWTH

## 2010-11-10 NOTE — H&P (Signed)
[name], [name]             ACCOUNT NO.:  [id number]  MEDICAL RECORD NO.:  [id number]  LOCATION:  XRAY                         FACILITY:  [facility]  PHYSICIAN:  Tarry Kos, MD       DATE OF BIRTH:  1980/03/12  DATE OF ADMISSION:  11/07/2010 DATE OF DISCHARGE:                             HISTORY & PHYSICAL   CHIEF COMPLAINT:  Chest pain.  HISTORY OF PRESENT ILLNESS:  [name] is a 30 year old male with a longstanding history of sickle cell disease, who presents to the emergency department in sickle cell crisis.  He typically presents with rib pain and femur pain which is the same as it is now, it is 8/10.  He has received several doses of IV Dilaudid in the ED, has had some relief.  His threshold for transfusion is a hemoglobin of 7-7.5.  He denies any fever, nausea, vomiting, or diarrhea.  He denies any abdominal pain or shortness of breath.  We are being asked to admit him for sickle cell crisis.  He is normally followed by Dr. [name] in the hospital. PAST MEDICAL HISTORY: 1. Sickle cell disease. 2. Multiple admissions, this is the fourth admission since the     beginning of August.  There are no discharge summaries done. 3. He had pulmonary emboli 4 years ago. 4. Avascular necrosis. 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 6. Thrombocytosis. 7. Umbilical hernia repair. 8. Anxiety. 9. History of hypokalemia.  MEDICATIONS:  Unclear  ALLERGIES:  MORPHINE.  SOCIAL HISTORY:  He denies smoking.  No alcohol, no IV drug abuse.  REVIEW OF SYSTEMS:  Otherwise negative.  PHYSICAL EXAMINATION:  VITAL SIGNS:  Temperature is 97.9, blood pressure 133/85, pulse 85, respirations 18, 98% O2 sats on room air. GENERAL:  He is alert and oriented x4, no apparent distress, cooperative, friendly. HEENT:  Extraocular muscles intact.  Pupils equal, reactive to light. Oropharynx clear.  Mucous membranes moist. NECK:  No JVD.  No carotid bruits. CARDIAC:  Regular rate and rhythm without  murmurs, rubs, or gallops. CHEST:  Clear to auscultation bilaterally.  No wheezes, rhonchi, or rales. ABDOMEN:  Soft, nontender, nondistended.  Positive bowel sounds.  No hepatosplenomegaly. EXTREMITIES:  No clubbing, cyanosis, or edema. PSYCHIATRIC:  Normal affect. NEUROLOGIC:  No focal neurologic deficits. SKIN:  No rashes.  LABORATORY DATA:  His hemoglobin is 7.3, white count is 21.7.  Retic count is elevated.  Chest x-ray, humeral head AVN, no other acute issues.  ASSESSMENT AND PLAN:  A 30 year old male with sickle cell crisis.  Sickle cell crisis.  We will place him on some IV fluids aggressively. We will also monitor his hemoglobin.  If he drops any further, he probable will need a transfusion, place him on Dilaudid, clarify his home medication list and resume those.  The patient is normally seen by Dr. [name] who will likely pick him up tomorrow.  We will also place him on Lovenox for DVT prophylaxis.  Further recommendation pending overall hospital course.          ______________________________ Tarry Kos, MD     RD/MEDQ  D:  11/07/2010  T:  11/07/2010  Job:  [id number]  Electronically Signed by Terie Purser  Noris Kulinski MD on 11/10/2010 07:03:49 PM

## 2010-11-11 NOTE — Discharge Summary (Signed)
NAMETIGER, SPIEKER             ACCOUNT NO.:  [id number]  MEDICAL RECORD NO.:  [id number]  LOCATION:  1318                         FACILITY:  [facility]  PHYSICIAN:  Margues Filippini L. [name], M.D.     DATE OF BIRTH:  September 15, 1980  DATE OF ADMISSION:  10/23/2010 DATE OF DISCHARGE:  10/28/2010                              DISCHARGE SUMMARY   FINAL DIAGNOSES: 1. Sickle cell crisis. 2. Deep venous thrombosis in the right internal jugular. 3. Mild positioning of Port-a-Cath. 4. Pharyngitis.  OPERATION/PROCEDURES: 1. Removal of Port-a-Cath. 2. Transfusion of 1 unit of packed RBCs.  HISTORY OF PRESENT ILLNESS:  The patient is a 30 year old single black male with a past medical history of sickle cell disease who presented to emergency room with a chief complaint of pain in his ribs.  He knows he was doing well until one night prior to admission when he had a sudden onset of pain in his chest.  The pain was bilateral in nature, nonprogressive in nature.  The pain was constant, which he described as a 7/10.  The patient reports it is the same pain that he gets with the sickle cell crisis.  He subsequently came to the emergency room for further evaluation.  The patient was admitted thereafter.  Past medical history and physical exam as per admission H and P.  HOSPITAL COURSE:  The patient was initially admitted to the hospitalist service for further treatment of his sickle cell crisis.  He was placed on PCA Dilaudid for control of his pain, full dose.  He was given IV fluids initially consisting of normal saline.  He was also maintained on his OxyContin 40 mg b.i.d.  The patient was seen on September 23rd by Dr. Sharyn Lull in my absence.  He still was having significant pain in his chest.  He was also placed on Avelox for treatment of mild bronchitis as well.  The patient was seen by me on October 25, 2010.  He was changed to a PCA custom dose, which allowed higher dose of pain medication. This did  help a great deal as well.  On September 25th, the patient spiked a fever with increasing pain in his right side of his neck.  There was a concern for possible pharyngitis or abscess.  A CT scan was done for further evaluation.  At that time, he was found to have a thrombosis with associated thrombophlebitis in the right internal jugular.  It was noted that a portion of his catheter had looped back into the right subclavian vein, though the site of thrombosis was just above the level of the catheter. The patient was thereafter placed on Lovenox with dosing of purpura for DVT.  He was maintained on antibiotics for 24 hours.  He subsequently underwent another procedure per Interventional Radiology for removal of his Port-a-Cath, which he tolerated well.  The subsequent day, the patient felt much better.  He had marked reduction and swelling of the right side of his neck as well.  By September 27th, he was doing much better.  His pain was manageable.  He denied any chest pains or shortness of breath.  There was marked reduction and swelling of hisright internal  jugular.  The patient was eager to go home at that time. Arrangements were made for the patient to administer Lovenox as an outpatient as well as Coumadin therapy with close followup being recommended.  MEDICATIONS AT THE TIME OF DISCHARGE: 1. Lovenox 120 mg subcu q.24 hours. 2. Warfarin 5 mg daily. 3. Ambien 10 mg at bedtime. 4. Folic acid 1 mg daily. 5. Hydromorphone 4 mg q.4 h. p.r.n. pain. 6. Hydroxyurea 500 mg b.i.d. 7. Metoprolol 25 mg half tablet b.i.d. 8. OxyContin 40 mg q.12 hours. 9. Zyprexa 10 mg at bedtime. The patient was instructed on a low vitamin K diet.  He will need to be seen by Dr. [name] in 2 weeks time.  He has been given appointment to be seen by myself in 1 week's time.  He will need to have his PT/INR done on November 01, 2010.  A prescription has been given.           ______________________________ [name] [name], M.D.     ELD/MEDQ  D:  11/10/2010  T:  11/11/2010  Job:  [id number]  Electronically Signed by Willey Blade M.D. on 11/11/2010 11:49:44 AM

## 2010-11-11 NOTE — Discharge Summary (Signed)
  [name], [name]             ACCOUNT NO.:  [id number]  MEDICAL RECORD NO.:  [id number]  LOCATION:  1332                         FACILITY:  [facility]  PHYSICIAN:  Christin Mccreedy L. [name], M.D.     DATE OF BIRTH:  [birth year]  DATE OF ADMISSION:  09/29/2010 DATE OF DISCHARGE:  10/07/2010                              DISCHARGE SUMMARY   FINAL DIAGNOSES: 1. Sickle cell crisis. 2. Hypokalemia. 3. Bronchitis. 4. Chest wall pain secondary to sickle cell crisis. 5. Mood disorder.  OPERATION/PROCEDURES:  Transfusion of 1 unit of packed RBCs.  HISTORY OF PRESENT ILLNESS:  The patient is a 30 year old single black male with hemoglobin SS disease with multiple admissions in the past with painful crisis.  The patient was discharged on September 14, 2010, and was doing well initially.  On the day prior to admission, however, he noted acute onset of bilateral rib pain as well as abdominal pain.  He had associated vomiting as well.  The patient came to the emergency room for further evaluation.  He was given IV fluids as well as pain medication.  Despite this, his symptoms did not resolve completely.  He was subsequently admitted to the hospitalist service for further treatment.  Past medical history and physical exam is per admission H and P.  HOSPITAL COURSE:  The patient was admitted to the floor for further treatment of his sickle cell crisis.  He was started on IV fluids with normal saline initially.  He was placed on Dilaudid for control of his pain as well.  The patient was transferred to my service on the subsequent day.  Repeat electrolytes showed a potassium of 3.3.  This was subsequently repleted.  He also was noted to have early congestion in his lungs.  He had acknowledged some occasional cough as well.  It is felt that he had a component of bronchitis.  The patient was started on empiric Z-PAK as well.  He was also placed on incentive spirometry.  Over the subsequent days, the patient  made a slow, but steady recovery. His pain gradually decreased to 5/10, but at time he felt that he could manage his pain at home.  His lungs on October 07, 2010, was clear. Chest wall tenderness had markedly decreased.  It should be noted that he did require 1 transfusion during the hospital stay as well, which he tolerated.  By September 6th, he was subsequently discharged home, much improved.  MEDICATIONS AT THE TIME OF DISCHARGE: 1. OxyContin 40 mg q.12 hours. 2. Ambien 10 mg at bedtime p.r.n. 3. Folic acid 1 mg daily. 4. Hydromorphone 4 mg q.4 h. p.r.n. pain. 5. Hydroxyurea 500 mg b.i.d. 6. Ibuprofen 800 mg t.i.d. 7. Metoprolol 25 mg half tablet b.i.d. 8. Zyprexa 10 mg p.o. at bedtime.  The patient will be maintained on a regular diet.  He is to be followed up with Dr. [name] in 2 weeks time.          ______________________________ Lind Guest [name], M.D.     ELD/MEDQ  D:  11/10/2010  T:  11/11/2010  Job:  [id number]  Electronically Signed by Willey Blade M.D. on 11/11/2010 11:49:29 AM

## 2010-11-12 LAB — DIFFERENTIAL
Basophils Absolute: 0.1
Basophils Relative: 0
Eosinophils Absolute: 0.3
Eosinophils Relative: 2
Lymphocytes Relative: 10 — ABNORMAL LOW
Lymphs Abs: 0.8
Monocytes Absolute: 0.1 — ABNORMAL LOW
Neutro Abs: 13.2 — ABNORMAL HIGH
Neutrophils Relative %: 76

## 2010-11-12 LAB — BASIC METABOLIC PANEL
BUN: 8
Calcium: 9
Creatinine, Ser: 0.51
GFR calc non Af Amer: >60
Glucose, Bld: 97
Sodium: 140

## 2010-11-12 LAB — COMPREHENSIVE METABOLIC PANEL
AST: 36 U/L (ref 0–37)
Albumin: 4.2
Alkaline Phosphatase: 117 U/L (ref 39–117)
Alkaline Phosphatase: 85
BUN: 7
CO2: 19 mEq/L (ref 19–32)
Calcium: 8.5
Chloride: 104 mEq/L (ref 96–112)
Creatinine, Ser: 0.48 mg/dL — ABNORMAL LOW (ref 0.50–1.35)
GFR calc non Af Amer: >90 mL/min (ref >90)
Glucose, Bld: 88
Potassium: 4.8 mEq/L (ref 3.5–5.1)
Potassium: 3.4 — ABNORMAL LOW
Sodium: 132 — ABNORMAL LOW
Total Bilirubin: 2.1 mg/dL — ABNORMAL HIGH (ref 0.3–1.2)
Total Protein: 7.4

## 2010-11-12 LAB — CBC
HCT: 22.2 % — ABNORMAL LOW (ref 39.0–52.0)
HCT: 22.7 % — ABNORMAL LOW (ref 39.0–52.0)
Hemoglobin: 7.5 g/dL — ABNORMAL LOW (ref 13.0–17.0)
Hemoglobin: 9.6 — ABNORMAL LOW
Hemoglobin: 9.4 — ABNORMAL LOW
MCH: 30.5 pg (ref 26.0–34.0)
MCHC: 33.8 g/dL (ref 30.0–36.0)
MCHC: 34.1
MCHC: 33.4
MCHC: 33.5 g/dL (ref 30.0–36.0)
Platelets: 440 — ABNORMAL HIGH
Platelets: 421 — ABNORMAL HIGH
Platelets: 603 10*3/uL — ABNORMAL HIGH (ref 150–400)
RBC: 2.46 MIL/uL — ABNORMAL LOW (ref 4.22–5.81)
RDW: 17.7 — ABNORMAL HIGH
RDW: 16.1 — ABNORMAL HIGH
RDW: 19.5 % — ABNORMAL HIGH (ref 11.5–15.5)
WBC: 16.2 10*3/uL — ABNORMAL HIGH (ref 4.0–10.5)

## 2010-11-12 LAB — URINALYSIS, ROUTINE W REFLEX MICROSCOPIC
Bilirubin Urine: NEGATIVE
Ketones, ur: NEGATIVE
Nitrite: NEGATIVE
Protein, ur: NEGATIVE

## 2010-11-12 LAB — RAPID STREP SCREEN (MED CTR MEBANE ONLY): Streptococcus, Group A Screen (Direct): NEGATIVE

## 2010-11-12 LAB — RETICULOCYTES
RBC.: 2.86 — ABNORMAL LOW
Retic Count, Absolute: 185.9
Retic Ct Pct: 6.5 — ABNORMAL HIGH
Retic Ct Pct: 4.6 — ABNORMAL HIGH

## 2010-11-12 LAB — POCT CARDIAC MARKERS
CKMB, poc: <1 — ABNORMAL LOW
Operator id: 1192
Troponin i, poc: <0.05

## 2010-11-12 LAB — PROTIME-INR: INR: 2.01 — ABNORMAL HIGH (ref 0.00–1.49)

## 2010-11-13 ENCOUNTER — Inpatient Hospital Stay (HOSPITAL_COMMUNITY): Payer: Medicare Other

## 2010-11-13 LAB — PROTIME-INR
INR: 2.3 — ABNORMAL HIGH (ref 0.00–1.49)
Prothrombin Time: 25.7 seconds — ABNORMAL HIGH (ref 11.6–15.2)

## 2010-11-14 LAB — PROTIME-INR: Prothrombin Time: 22.3 seconds — ABNORMAL HIGH (ref 11.6–15.2)

## 2010-11-16 LAB — CBC
HCT: 20.2 — ABNORMAL LOW
HCT: 21.8 — ABNORMAL LOW
Hemoglobin: 6.8 — CL
MCHC: 33.4
MCHC: 33.5
MCHC: 34.2
MCV: 93.5
MCV: 94.2
MCV: 94.3
MCV: 95.9
Platelets: 244
Platelets: 264
Platelets: 345
RBC: 3.44 — ABNORMAL LOW
RDW: 18.1 — ABNORMAL HIGH
RDW: 18.6 — ABNORMAL HIGH
RDW: 19.1 — ABNORMAL HIGH
WBC: 26.8 — ABNORMAL HIGH

## 2010-11-16 LAB — COMPREHENSIVE METABOLIC PANEL
ALT: 17
AST: 25
Albumin: 4
CO2: 27
Calcium: 9
Creatinine, Ser: 0.54
GFR calc Af Amer: >60
GFR calc non Af Amer: >60
Sodium: 135
Total Protein: 7.1

## 2010-11-16 LAB — CROSSMATCH: DAT, IgG: NEGATIVE

## 2010-11-16 LAB — BASIC METABOLIC PANEL
BUN: 3 — ABNORMAL LOW
BUN: 4 — ABNORMAL LOW
BUN: 7
CO2: 28
Chloride: 103
Chloride: 103
Creatinine, Ser: 0.51
Creatinine, Ser: 0.59
GFR calc non Af Amer: >60
Glucose, Bld: 139 — ABNORMAL HIGH
Glucose, Bld: 140 — ABNORMAL HIGH
Potassium: 3 — ABNORMAL LOW
Potassium: 3.5
Sodium: 134 — ABNORMAL LOW

## 2010-11-16 LAB — PROTIME-INR
Prothrombin Time: 16.4 — ABNORMAL HIGH
Prothrombin Time: 32 — ABNORMAL HIGH

## 2010-11-16 LAB — POCT I-STAT 4, (NA,K, GLUC, HGB,HCT)
Glucose, Bld: 130 — ABNORMAL HIGH
HCT: 27 — ABNORMAL LOW
Potassium: 3.8

## 2010-11-16 LAB — HEMOGLOBIN AND HEMATOCRIT, BLOOD
HCT: 19.8 — ABNORMAL LOW
HCT: 26 — ABNORMAL LOW
Hemoglobin: 6.6 — CL
Hemoglobin: 8.6 — ABNORMAL LOW

## 2010-11-17 LAB — CBC
HCT: 33 — ABNORMAL LOW
Hemoglobin: 11 — ABNORMAL LOW
RDW: 20.2 — ABNORMAL HIGH
WBC: 14.7 — ABNORMAL HIGH

## 2010-11-17 LAB — URINE CULTURE

## 2010-11-17 LAB — URINALYSIS, ROUTINE W REFLEX MICROSCOPIC
Glucose, UA: NEGATIVE
Hgb urine dipstick: NEGATIVE
Ketones, ur: NEGATIVE
Protein, ur: NEGATIVE

## 2010-11-17 LAB — COMPREHENSIVE METABOLIC PANEL
Alkaline Phosphatase: 95
BUN: 4 — ABNORMAL LOW
Chloride: 104
GFR calc non Af Amer: >60
Glucose, Bld: 93
Potassium: 3.9
Total Bilirubin: 2.5 — ABNORMAL HIGH

## 2010-11-17 LAB — CROSSMATCH
Antibody Screen: NEGATIVE
Donor AG Type: NEGATIVE

## 2010-11-17 LAB — DIFFERENTIAL
Eosinophils Absolute: 0.4
Monocytes Absolute: 1 — ABNORMAL HIGH
Neutrophils Relative %: 72

## 2010-11-17 LAB — PROTIME-INR: Prothrombin Time: 14.5

## 2010-11-18 LAB — URINALYSIS, ROUTINE W REFLEX MICROSCOPIC
Bilirubin Urine: NEGATIVE
Nitrite: NEGATIVE
Protein, ur: NEGATIVE
Specific Gravity, Urine: 1.013
Urobilinogen, UA: 1

## 2010-11-18 LAB — DIFFERENTIAL
Band Neutrophils: 0
Basophils Absolute: 0
Blasts: 0
Eosinophils Relative: 3
Lymphs Abs: 1.7
Metamyelocytes Relative: 0
Monocytes Relative: 4
Myelocytes: 0
Smear Review: ADEQUATE

## 2010-11-18 LAB — COMPREHENSIVE METABOLIC PANEL
ALT: 22
AST: 29
Albumin: 4.4
CO2: 26
Calcium: 9.4
GFR calc Af Amer: >60
Sodium: 139
Total Protein: 8

## 2010-11-18 LAB — CBC
MCHC: 32.7
Platelets: 428 — ABNORMAL HIGH
RBC: 2.82 — ABNORMAL LOW

## 2010-11-20 IMAGING — CR DG CHEST 2V
2 series · 2 of 2 positions shown · non-contrast
Comparison: 11/13/2007

CLINICAL DATA: Weakness

CHEST - 2 VIEW

[w chest pa]
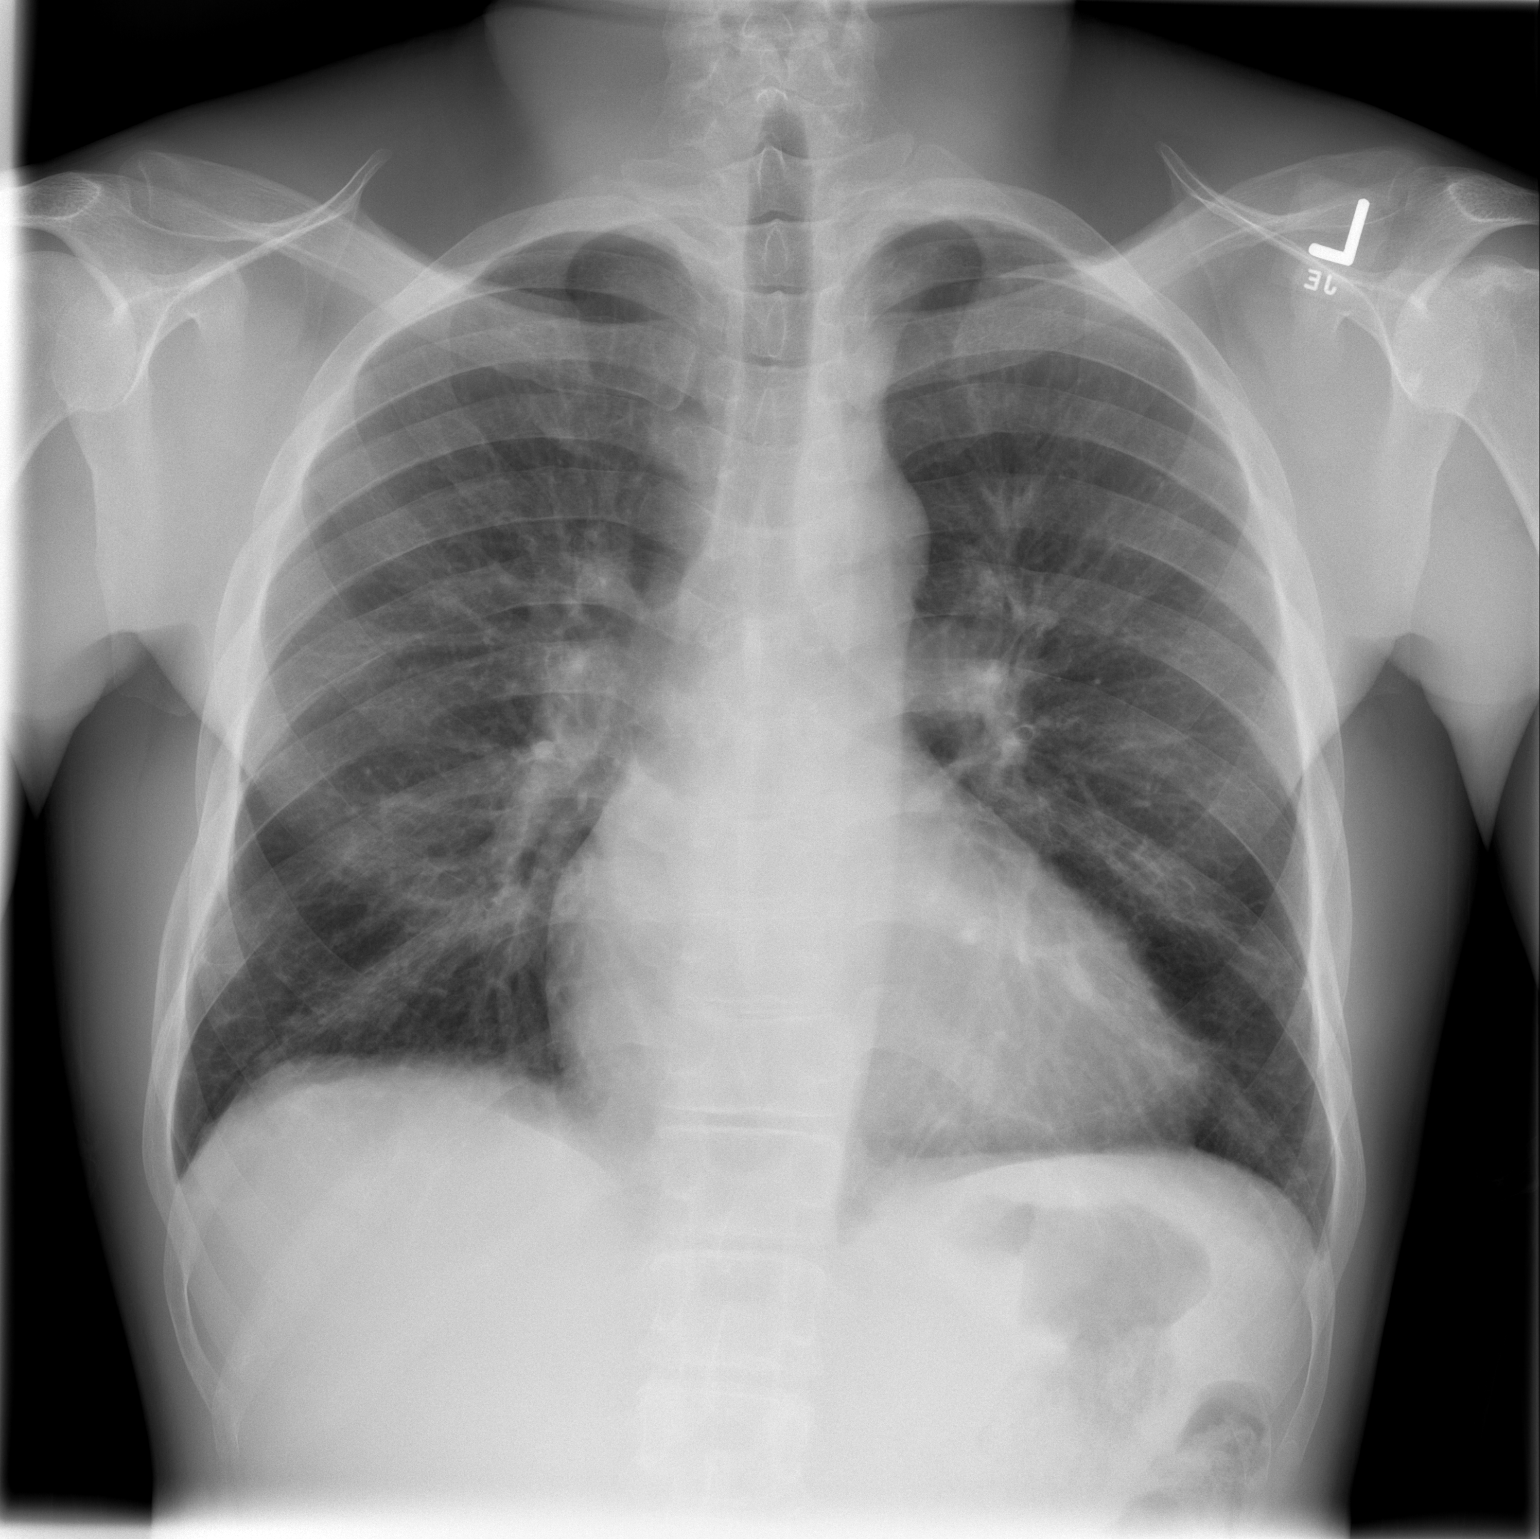

[w chest lat]
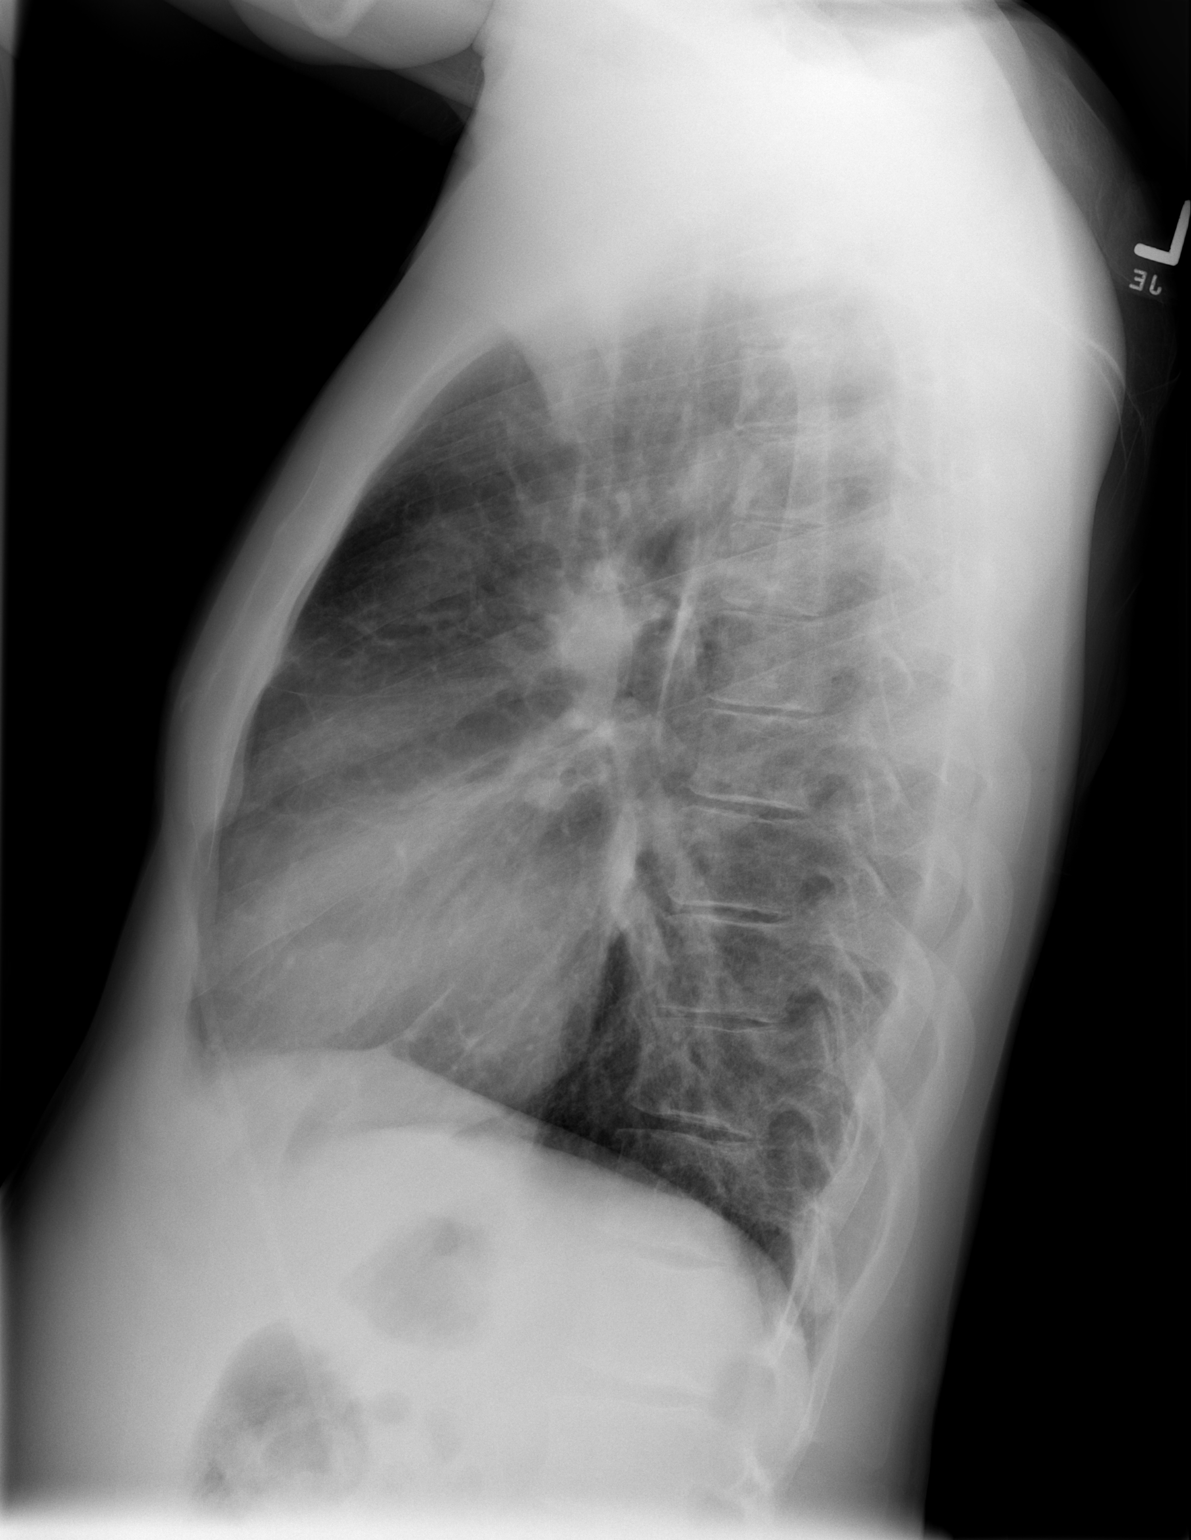

[2 of 2 positions shown; findings below may reference images not displayed]

FINDINGS: Borderline cardiomegaly again noted.  Chronic central
bronchitic changes again noted without significant change.  No
acute infiltrate or edema.  Bony thorax is stable.
IMPRESSION: No acute disease.  Borderline cardiomegaly.  Stable mild central
bronchitic changes.

## 2010-11-23 IMAGING — CR DG SHOULDER 2+V*L*
3 series · 3 of 3 positions shown · non-contrast
Comparison: No priors

CLINICAL DATA: Left shoulder pain - no known injury

LEFT SHOULDER - 2+ VIEW

[w shoulder ap internal left]
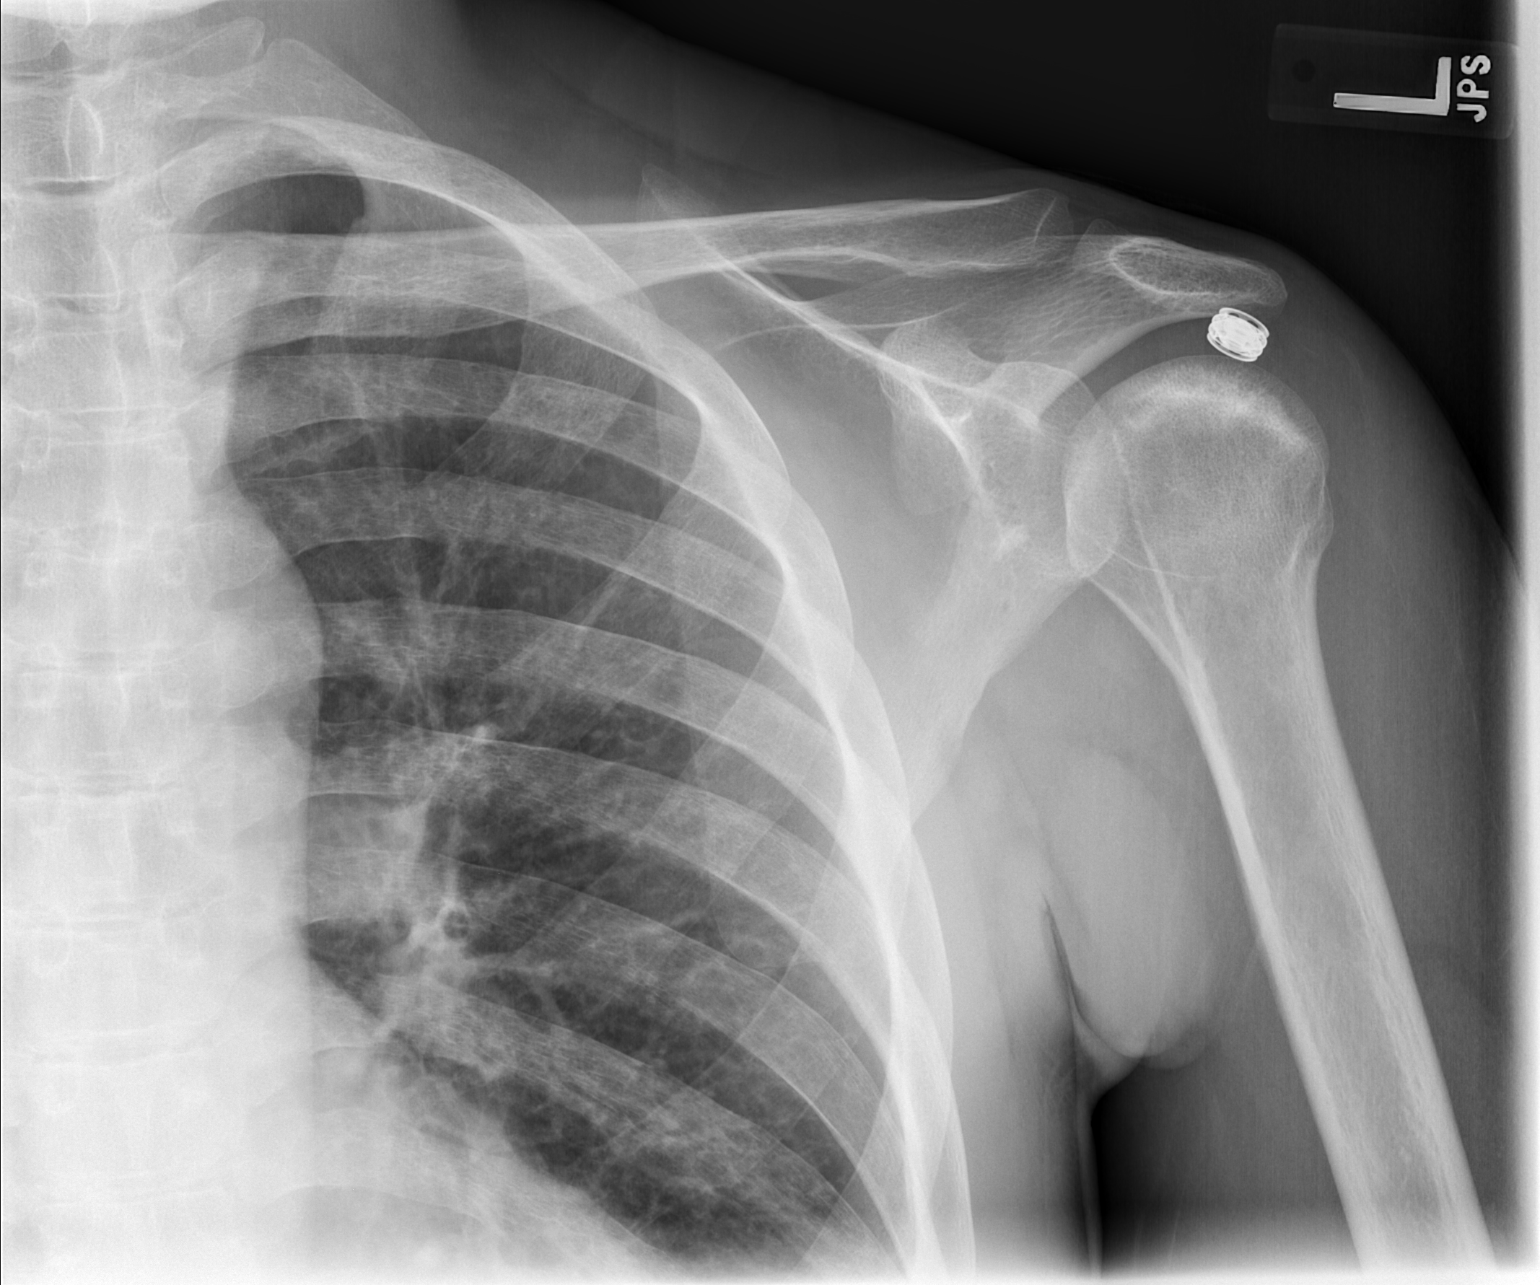

[w shoulder ap external left]
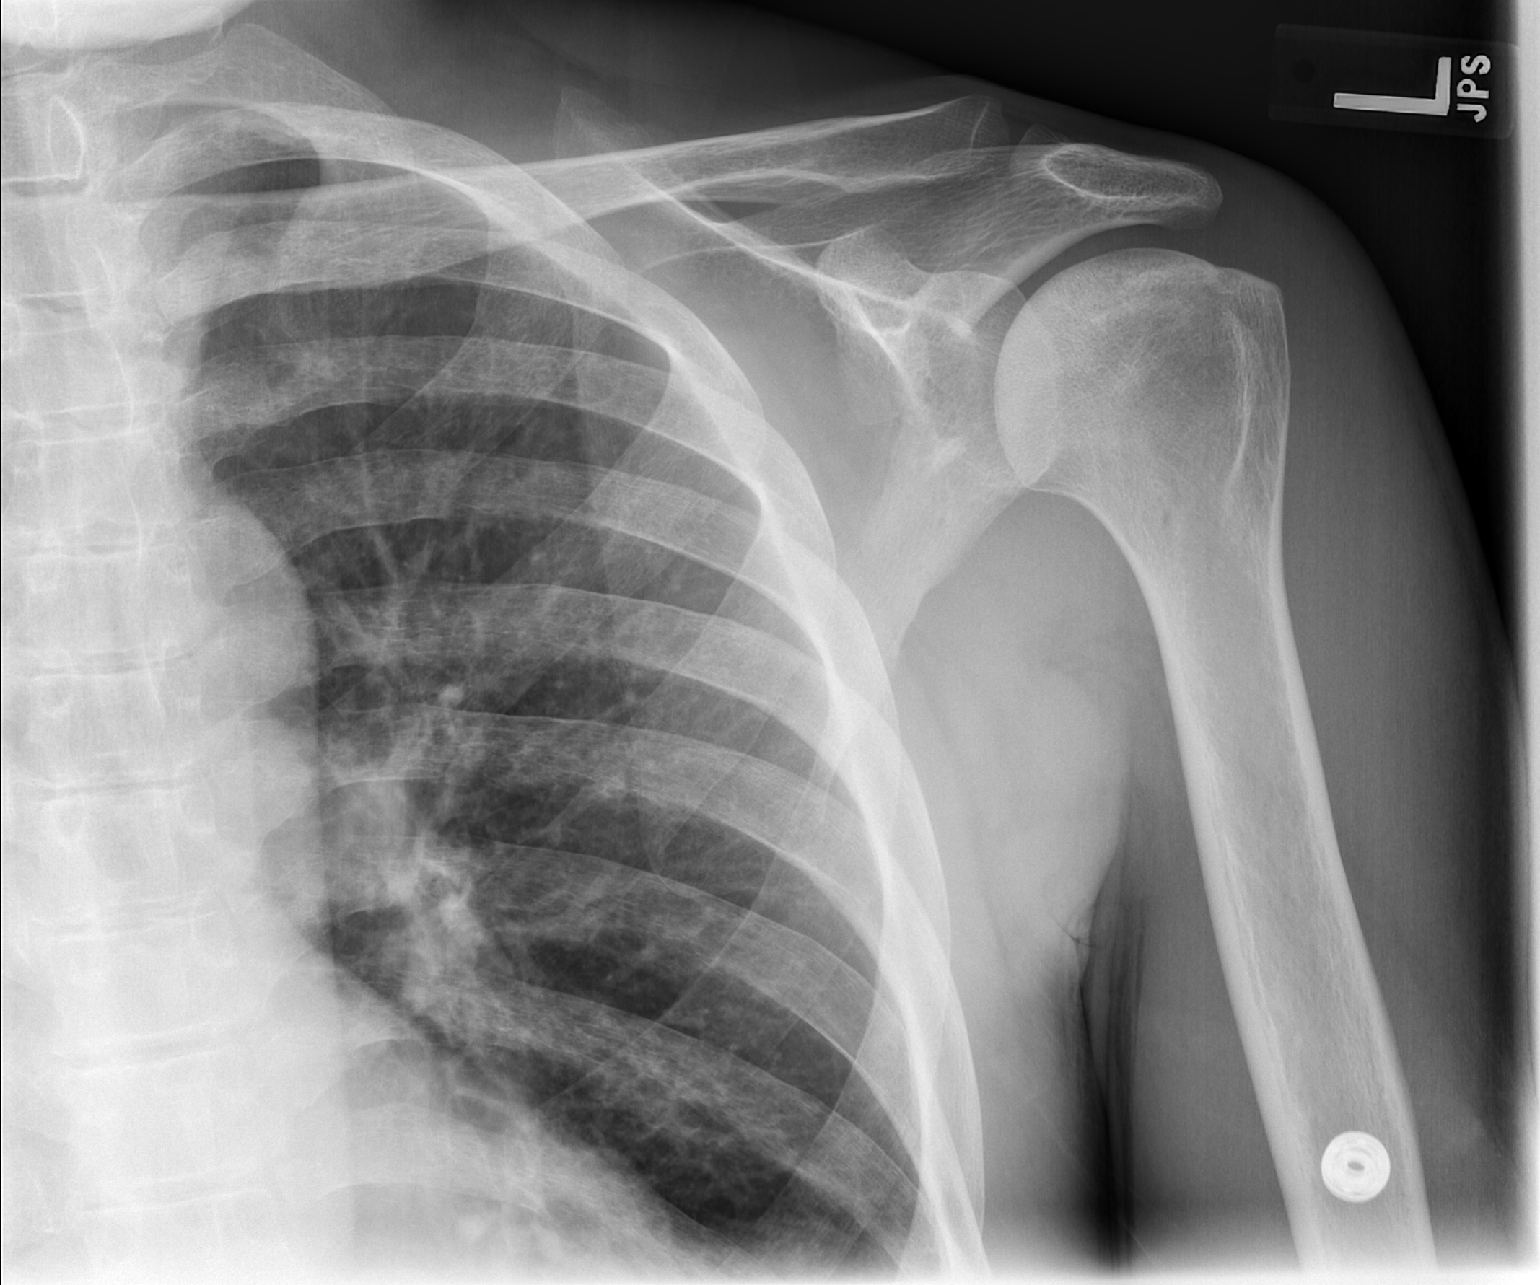

[w shoulder y view left]
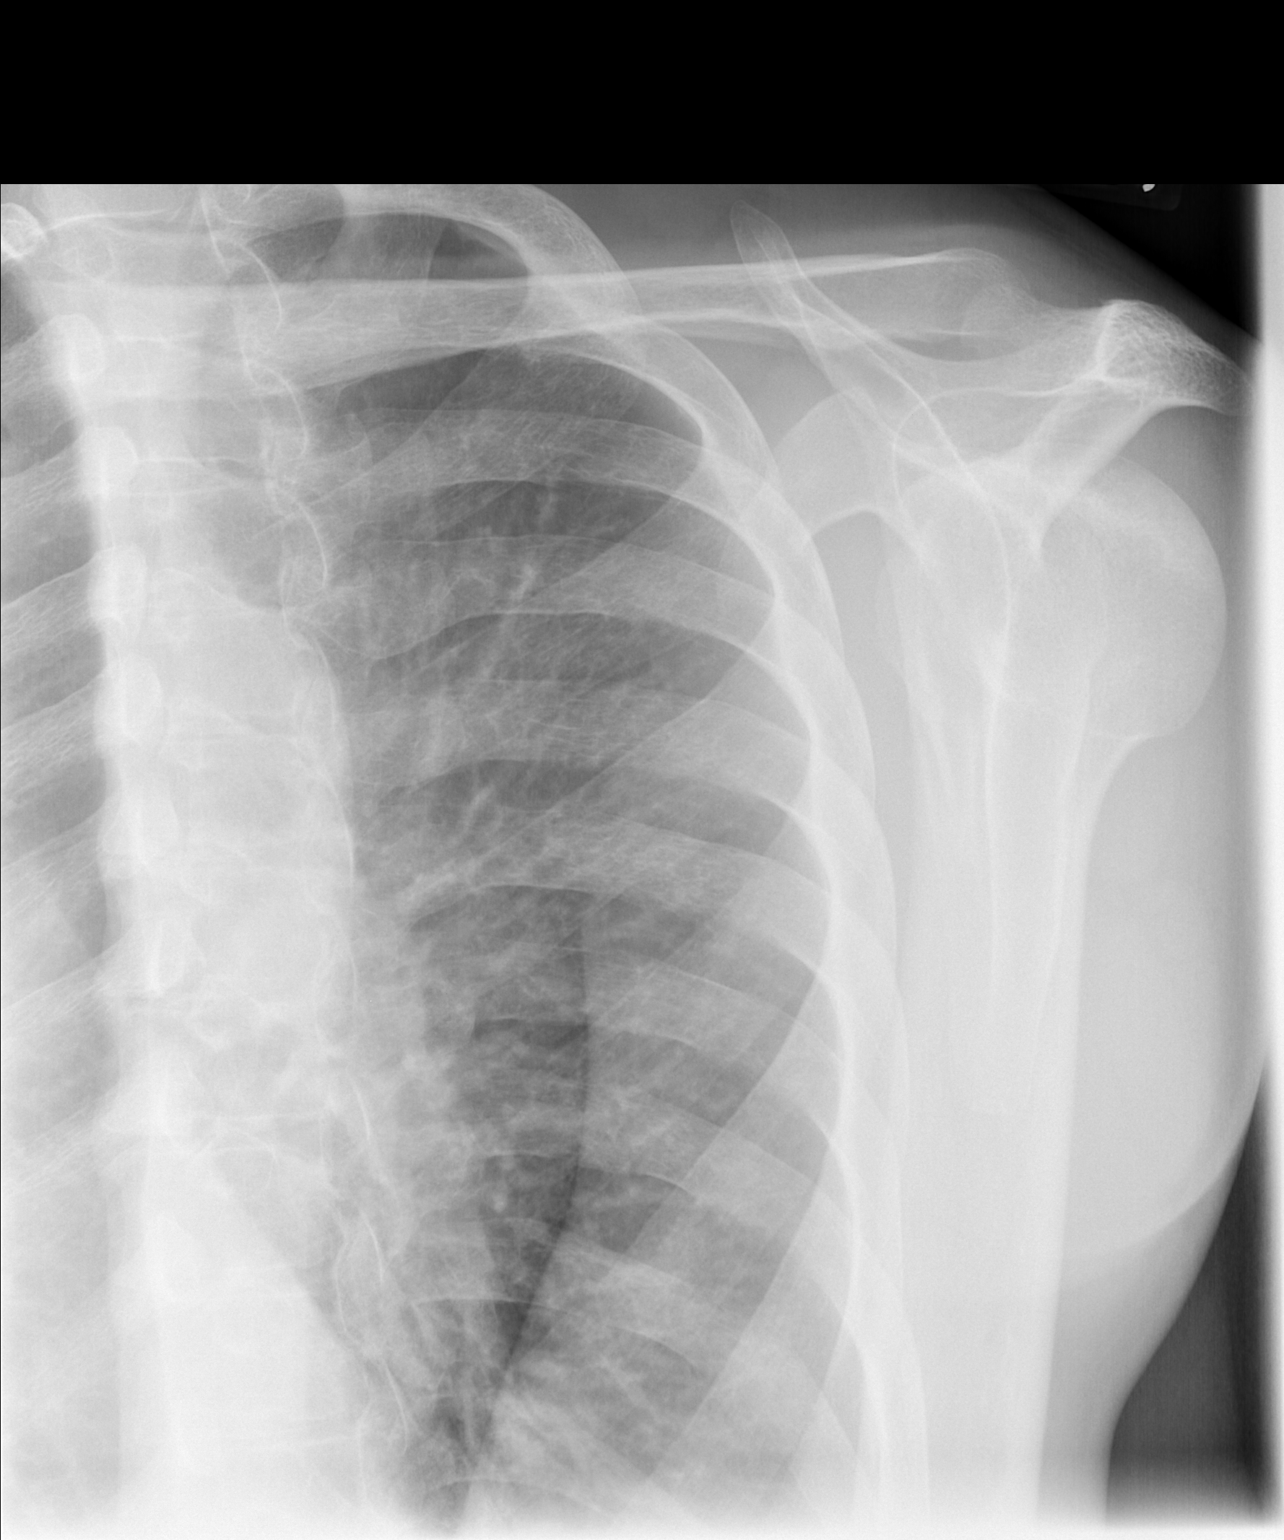

[3 of 3 positions shown; findings below may reference images not displayed]

FINDINGS: No fracture or dislocation.  No foreign body or other
abnormality of the soft tissues.
IMPRESSION: No acute or significant findings.

## 2010-12-01 IMAGING — CR DG CHEST 1V PORT
1 series · 1 of 1 positions shown · non-contrast
Comparison: 12/28/2007.

CLINICAL DATA: Chest pain

PORTABLE CHEST - 1 VIEW

[AP]
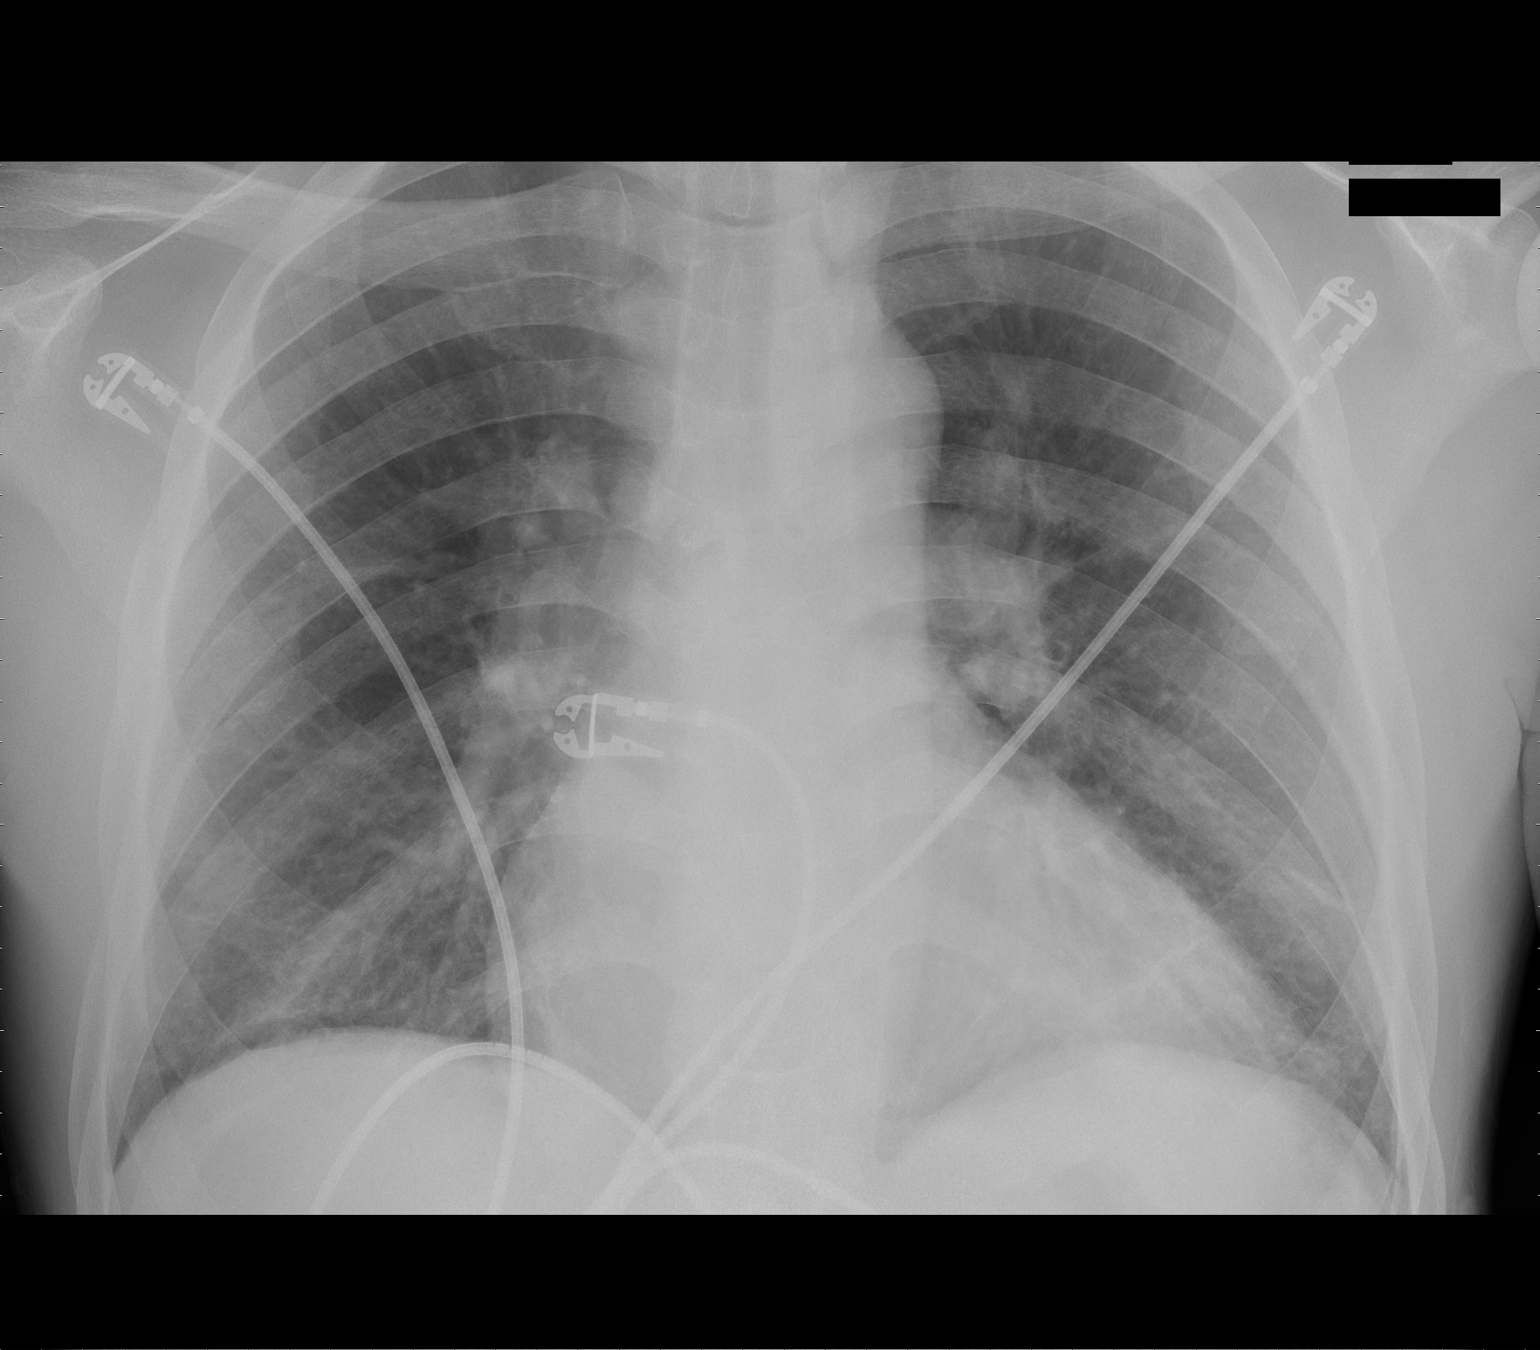

[1 of 1 positions shown; findings below may reference images not displayed]

FINDINGS: 2617 hours.  The cardiopericardial silhouette is
borderline to mildly enlarged.  There is low volumes with vascular
congestion.  Chronic underlying interstitial changes noted.  No
airspace pulmonary edema or focal lung consolidation.  Atelectasis
noted at both lung bases.
IMPRESSION: Borderline enlargement of the cardiopericardial silhouette.

Chronic underlying interstitial changes with bibasilar atelectasis.

## 2010-12-02 IMAGING — US US ABDOMEN COMPLETE
1 series · 14 of 25 positions shown · non-contrast
Comparison: 

CLINICAL DATA: Sickle cell crisis.  Chest pain.

ABDOMEN ULTRASOUND
TECHNIQUE: Complete abdominal ultrasound examination was performed
including evaluation of the liver, gallbladder, bile ducts,
pancreas, kidneys, spleen, IVC, and abdominal aorta.

[Series 1: unknown · 0.33mm/px · 14 of 59 slices shown]
[im 1/59]
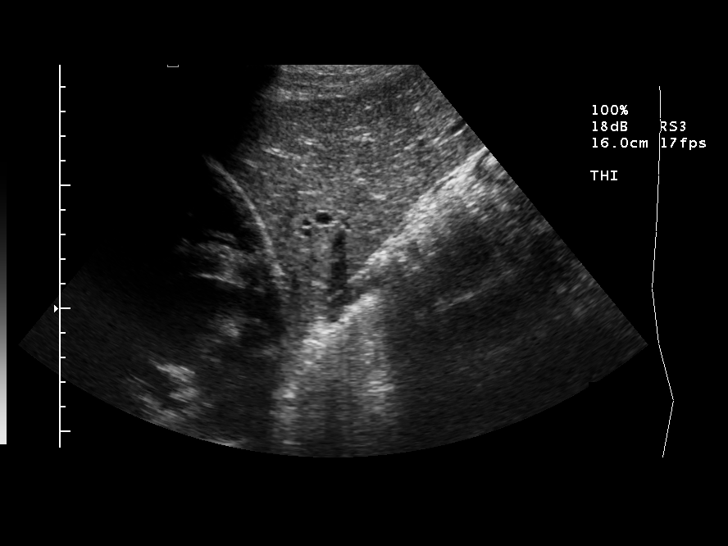
[im 5/59]
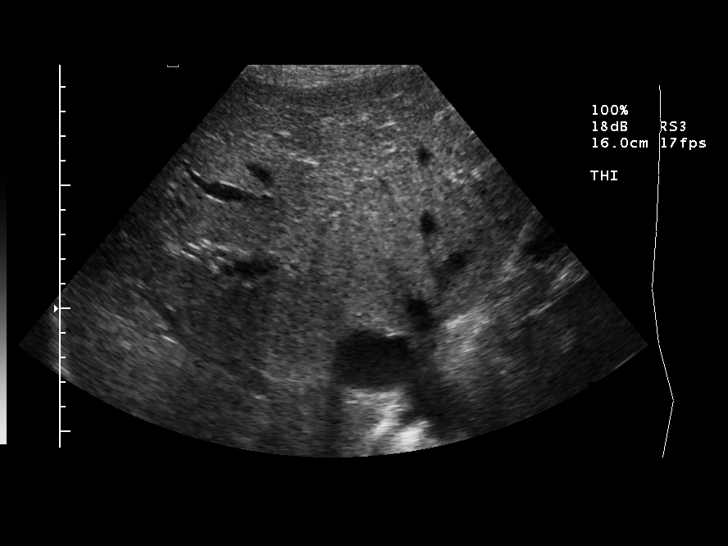
[im 10/59]
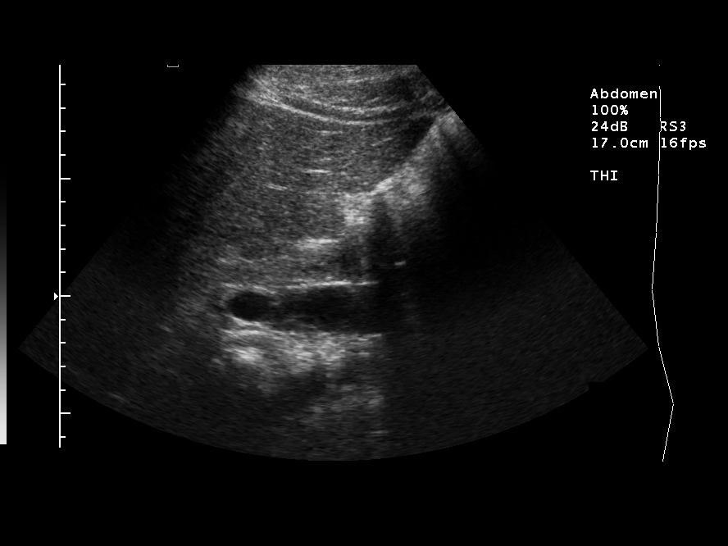
[im 15/59]
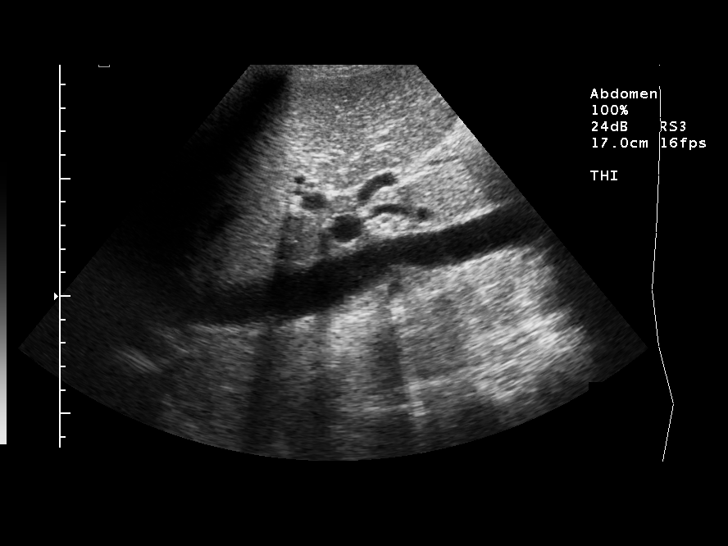
[im 20/59]
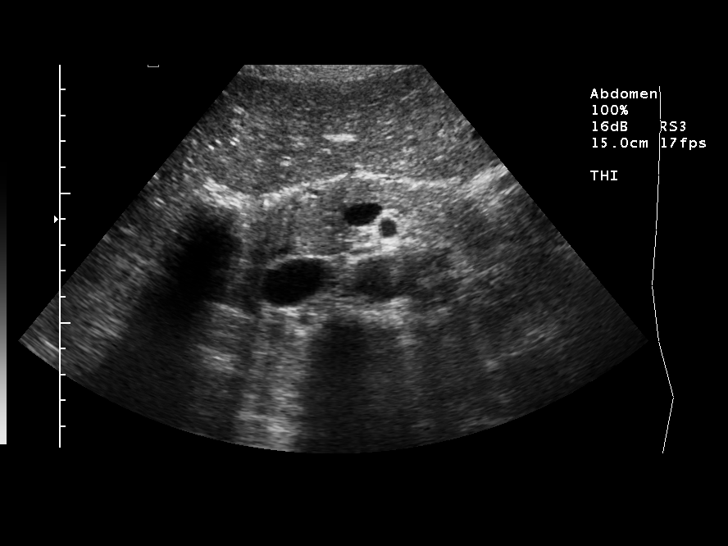
[im 22/59]
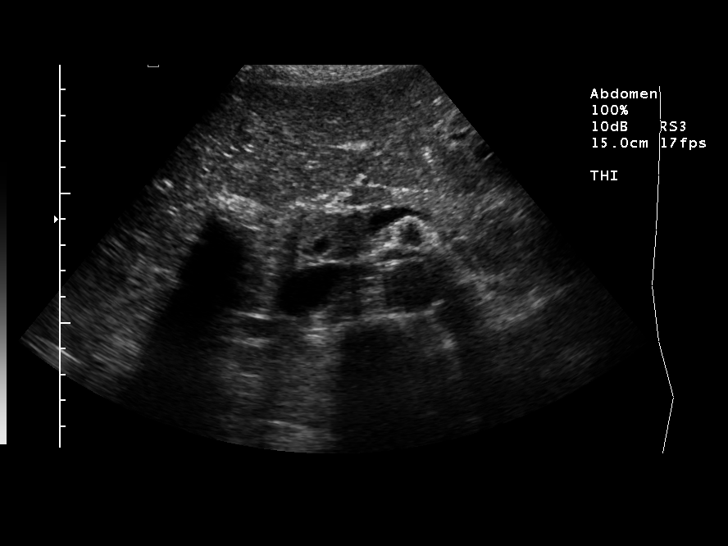
[im 27/59]
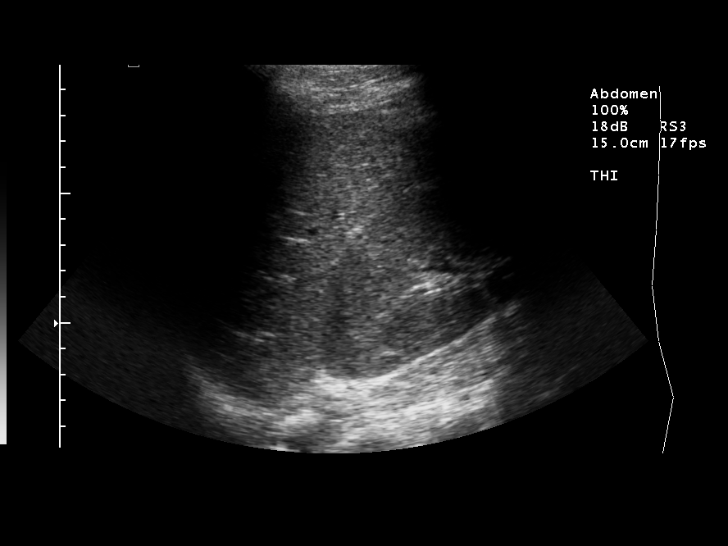
[im 32/59]
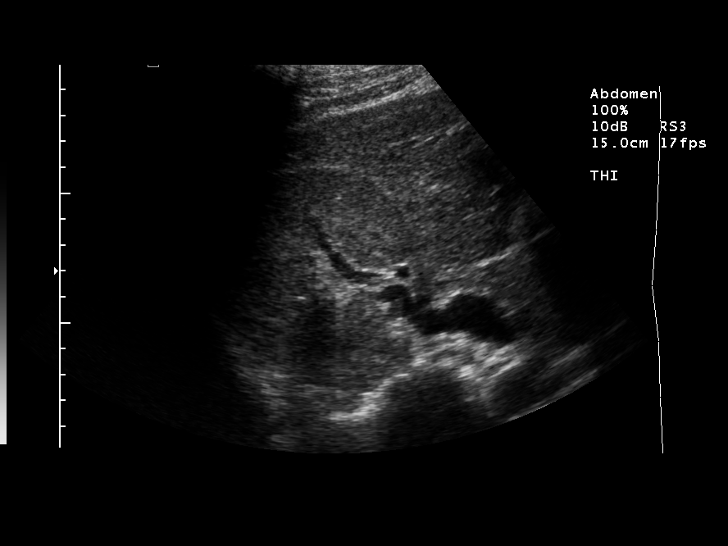
[im 37/59]
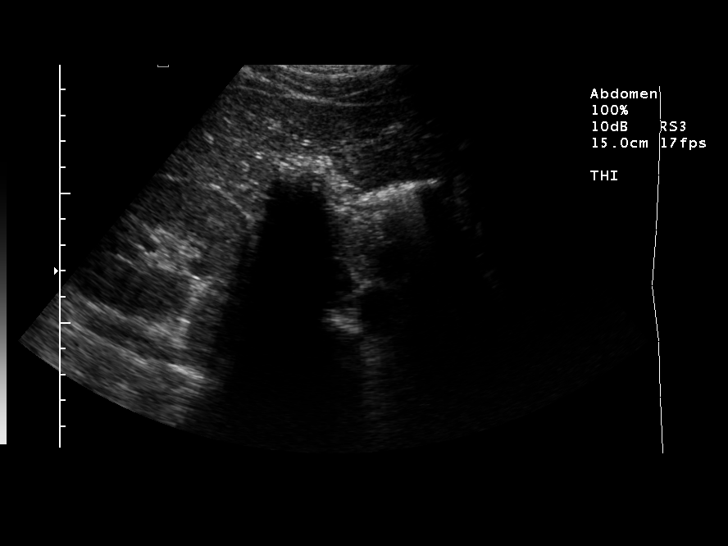
[im 39/59]
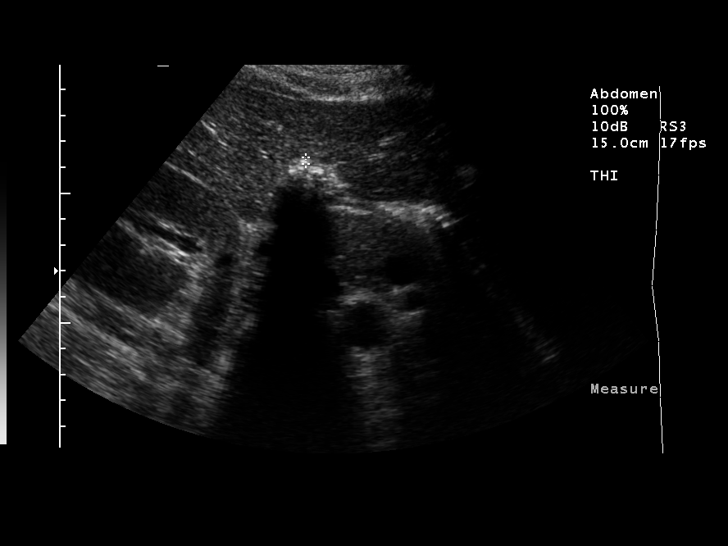
[im 44/59]
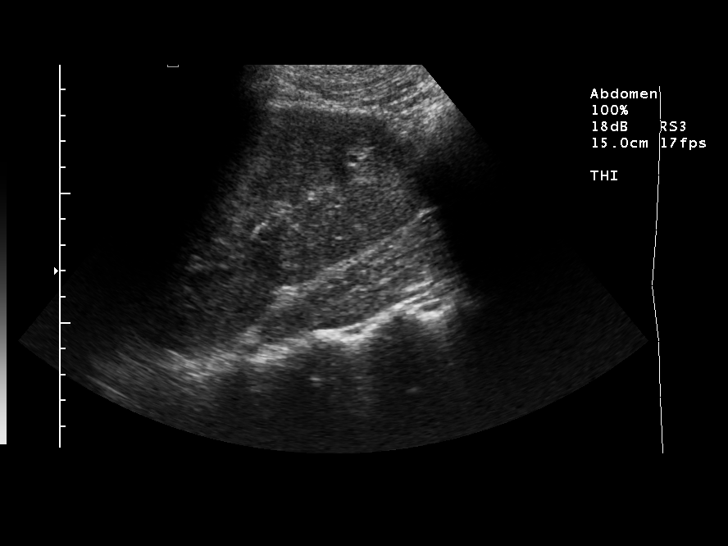
[im 49/59]
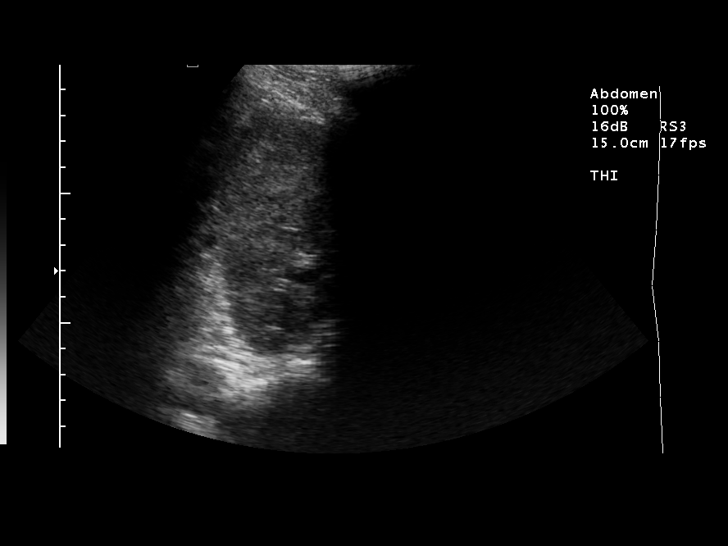
[im 54/59]
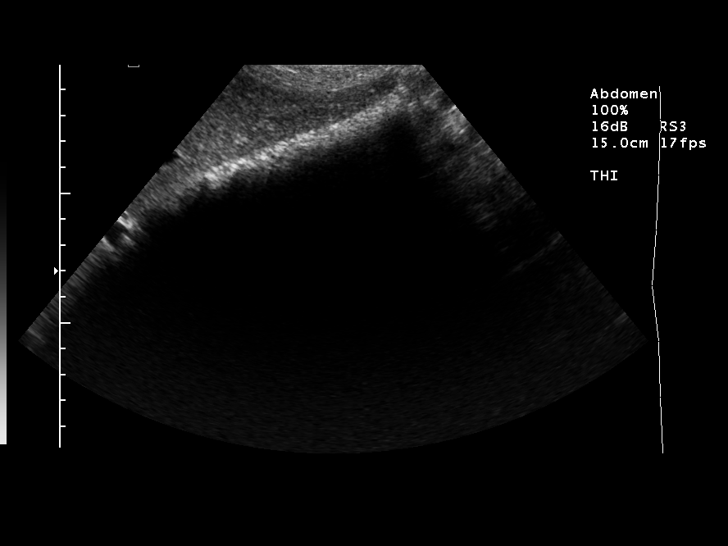
[im 59/59]
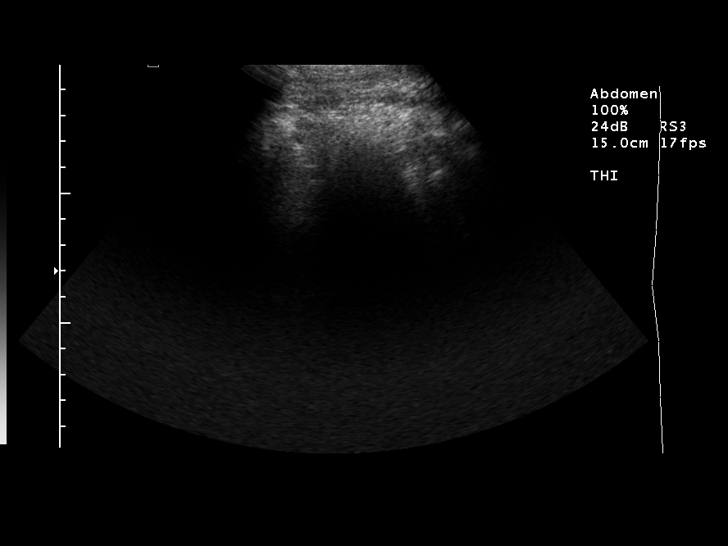

[14 of 25 positions shown; findings below may reference images not displayed]

FINDINGS: Gallbladder is contracted and full of stones.
Gallbladder wall thickness 2.5 mm.  No free pericholecystic fluid.
Negative ultrasound Murphy's sign. Common duct upper normal
measuring 6.5 mm in diameter.  No hepatic abnormality.  Patent IVC.
1.8 cm oval focus in the and are near the pancreatic neck to likely
represents a lymph node.  Spleen is not visualized, likely
atrophic/autosplenectomy.  Kidneys are enlarged with the right
measuring 16.8 cm in length and the left 14.3 cm in length.
Abdominal aorta maximal diameters 2.3 cm.
IMPRESSION: Gallbladder is contracted and full of stones.  Common duct upper
normal caliber.  Probable autosplenectomy. Bilateral nephromegaly.
This may be due to nephrotic syndrome.

## 2010-12-02 NOTE — Discharge Summary (Signed)
NAMEMILLIE, [name]             ACCOUNT NO.:  [id number]  MEDICAL RECORD NO.:  [id number]  LOCATION:  5029                         FACILITY:  [facility]  PHYSICIAN:  [place]i Goh L. [name], M.D.     DATE OF BIRTH:  Sep 05, 1980  DATE OF ADMISSION:  11/07/2010 DATE OF DISCHARGE:  11/14/2010                              DISCHARGE SUMMARY   FINAL DIAGNOSES: 1. Sickle cell crises. 2. History of a right internal jugular vein thrombosis. 3. Coumadin therapy secondary to number 2. 4. Recurrent hypokalemia rule out endocrine deficiency. 5. Right hip prosthesis.  OPERATIONS/PROCEDURES:  None.  HISTORY OF PRESENT ILLNESS:  This is one of multiple [facility] admissions for this 30 year old single black male with hemoglobin SS disease who presented to emergency room complaining of increasing chest pain and shoulder pain.  The patient had been doing well until several days prior to admission.  He states he had been taking his medication as directed.  He presented to emergency room and was evaluated.  His pain was not relieved with initial treatments in the emergency room and he subsequently admitted.  Past medical history and physical exam as per admission H and P per hospitalist.  HOSPITAL COURSE:  The patient was admitted for further treatment of his sickle cell crisis.  He was started on IV fluids initially with normal saline at 100 cc an hour.  He was also given Dilaudid 2 mg IV q.4 hours. This did not relieve his pain and this was subsequently changed to q.2 hours p.r.n.  The patient was subsequently transferred to my service on November 08, 2010.  His IV fluids was changed to D5 of 1/4 normal saline with 20 of KCl per L at 75 cc an hour.  He was noted at that time to be hypokalemic.  He was given additional potassium supplementation as well. He was placed on his usual home medicines of Zyprexa 10 mg at bedtime as well as folic acid 1 mg daily, and ibuprofen 600 mg 3 times a day.   The patient was switched to a PCA Dilaudid pump with custom dosing at 0.5 mg loading dose, 0.4 mg PCA dose with a lockout interval of 5 minutes and a 4 hour limit of 8 mg. Over the subsequent days, the patient made slow, but steady improvement. His pain did improve considerably.  He was restarted on his Coumadin therapy for his previously documented right internal jugular thrombosis from his last admission.  This was managed by the pharmacist as well. Eventually with supportive measures, the patient made steady progress. We did receive a request from his family regarding his right hip prosthesis.  There is question of whether he was having complications from chromium and cobalt being digged from the old prosthesis.  An x-ray of the right hip, which showed a stable prosthesis.  A chromium and cobalt level were drawn.  This will be followed up on as an outpatient. The patient showed no other evidence for heavy metal toxicity. The patient was subsequently discharged home, improved on November 14, 2010.  He had a therapeutic INR at that time with a pain level of 4/10.  MEDICATIONS AT THE TIME OF DISCHARGE:  Consisted of; 1. Zyprexa 10 mg at bedtime. 2. Potassium chloride 20 mEq b.i.d. 3. Ambien 10 mg nightly. 4. Folic acid 1 mg daily. 5. Dilaudid 4 mg q.4 hours p.r.n. 6. Hydroxyurea 500 mg b.i.d. 7. Ibuprofen 800 mg daily. 8. Oxycodone 40 mg SR twice a day. 9. Warfarin 5 mg 1-1/2 tablets daily. 10.The patient be maintained on a low-vitamin K diet. He will need followup with his INR in 3 days' time.          ______________________________ [name] [name], M.D.     ELD/MEDQ  D:  12/02/2010  T:  12/02/2010  Job:  [id number]  Electronically Signed by Willey Blade M.D. on 12/02/2010 09:52:40 AM

## 2010-12-03 IMAGING — NM NM LIVER FUNCTION STUDY
3 series · 13 of 13 positions shown · non-contrast
Comparison: Ultrasound 01/09/2008 study

CLINICAL DATA: History given of contracted gallbladder with
cholelithiasis.  History given of sickle cell crisis.

NM LIVER FUNCTION STUDY
TECHNIQUE: Sequential abdominal images were obtained for 120
minutes following intravenous injection of radiopharmaceutical.
Radiopharmaceutical: 5 mCi of Choletec

[Series 1: he hepatobiliary · 3.22mm/px · 6 of 60 frames shown (1 of 3)]
[frame 6/60]
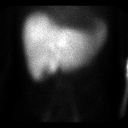
[frame 16/60]
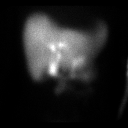
[frame 26/60]
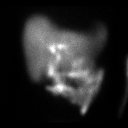
[frame 36/60]
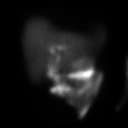
[frame 46/60]
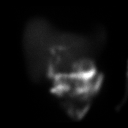
[frame 56/60]
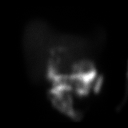

[Series 1: he hepatobiliary · 1 of 1 slices shown (2 of 3)]
[im 1/1]
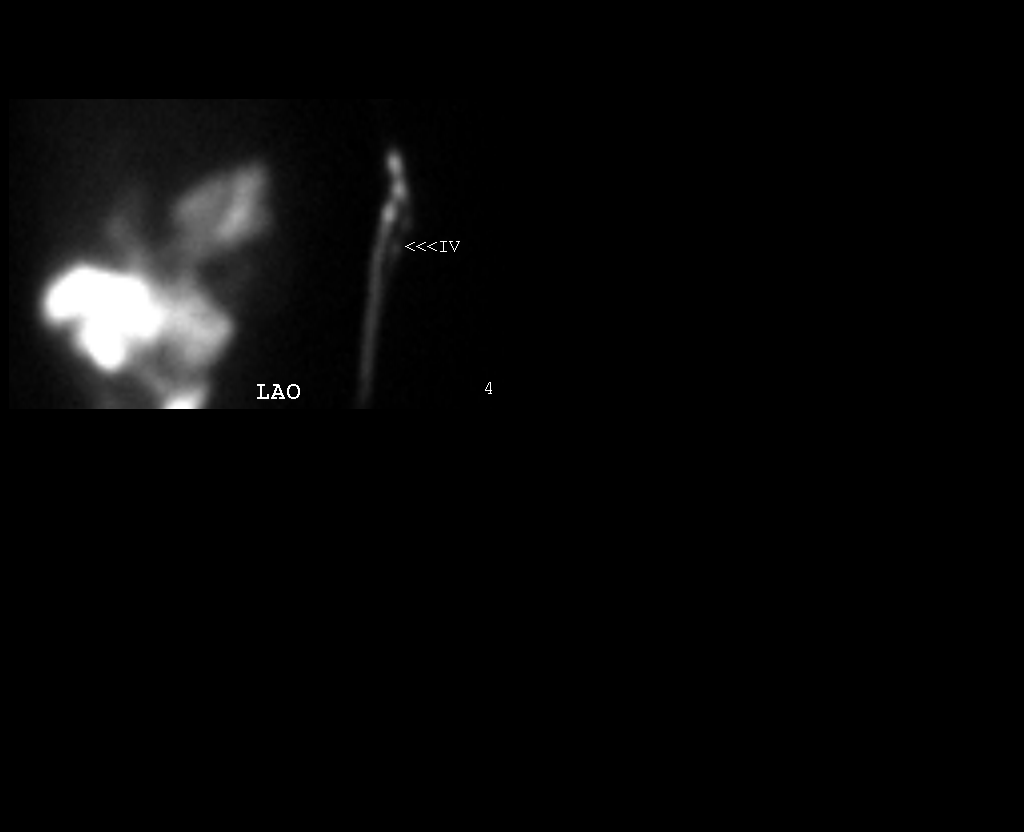

[Series 1: he hepatobiliary · 3.22mm/px · 6 of 60 frames shown (3 of 3)]
[frame 6/60]
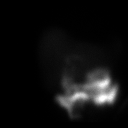
[frame 16/60]
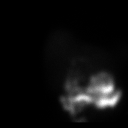
[frame 26/60]
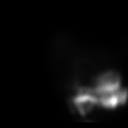
[frame 36/60]
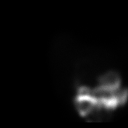
[frame 46/60]
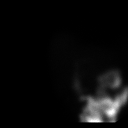
[frame 56/60]
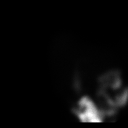

[13 of 13 positions shown; findings below may reference images not displayed]

FINDINGS: There is prompt visualization of hepatic activity, common
bile duct activity and activity within the duodenum and jejunum.
At the end of 1 hour no gallbladder activity could be identified.
History was given the patient is allergic to morphine.  I elected
to continue the imaging for another hour.  At the end of 120
minutes again the gallbladder had not visualized.
IMPRESSION: Nonvisualization of the gallbladder.  History given of contracted
gallbladder with cholelithiasis and sickle cell crisis.  If the
clinical setting is appropriate this must be considered high
probability for possible cystic duct obstruction and acute
cholecystitis.

Demonstration of patency of the common bile duct.

## 2010-12-04 IMAGING — RF DG CHOLANGIOGRAM OPERATIVE
1 series · 10 of 10 positions shown · non-contrast
Comparison: Hepatobiliary nuclear medicine scan 01/10/2008.

CLINICAL DATA: Status post microscopic cholecystectomy.

INTRAOPERATIVE CHOLANGIOGRAM
TECHNIQUE: Multiple fluoroscopic spot radiographs were obtained
during intraoperative cholangiogram and are submitted for
interpretation post-operatively.

[Series 1: run · 4 acquisitions, 10 frames shown]
[im 1/4]
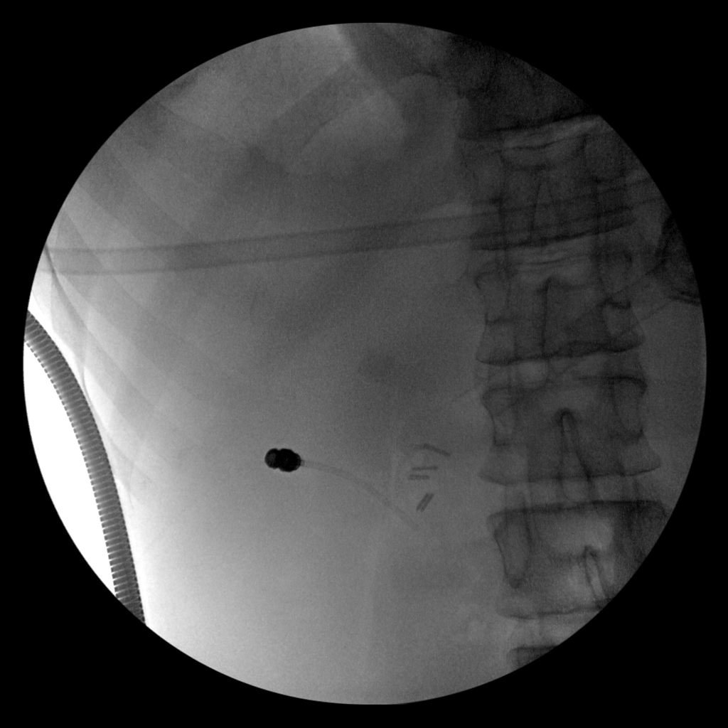
[im 1/4]
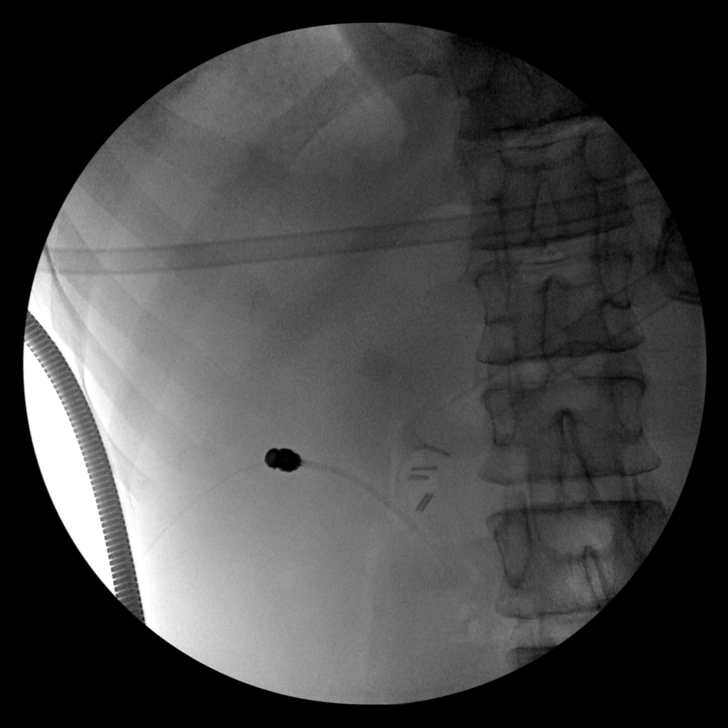
[im 1/4]
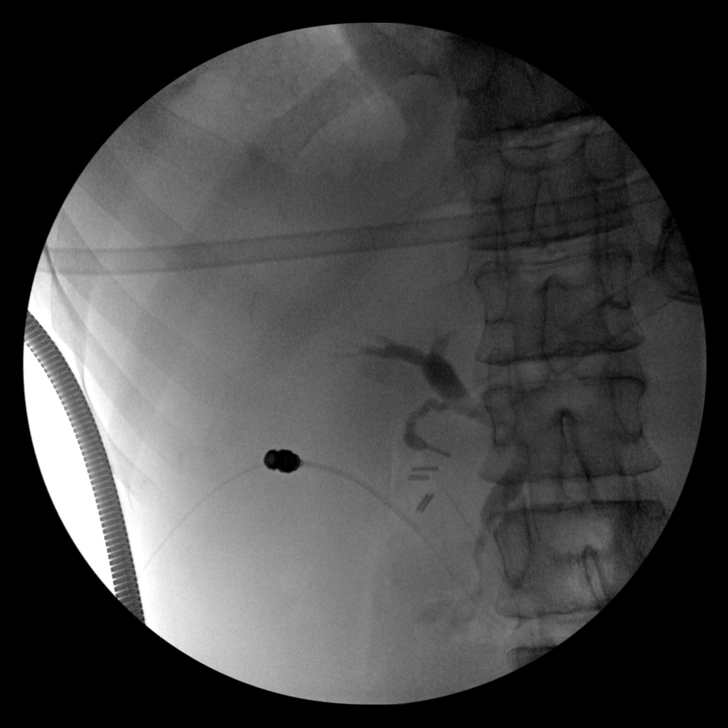
[im 1/4]
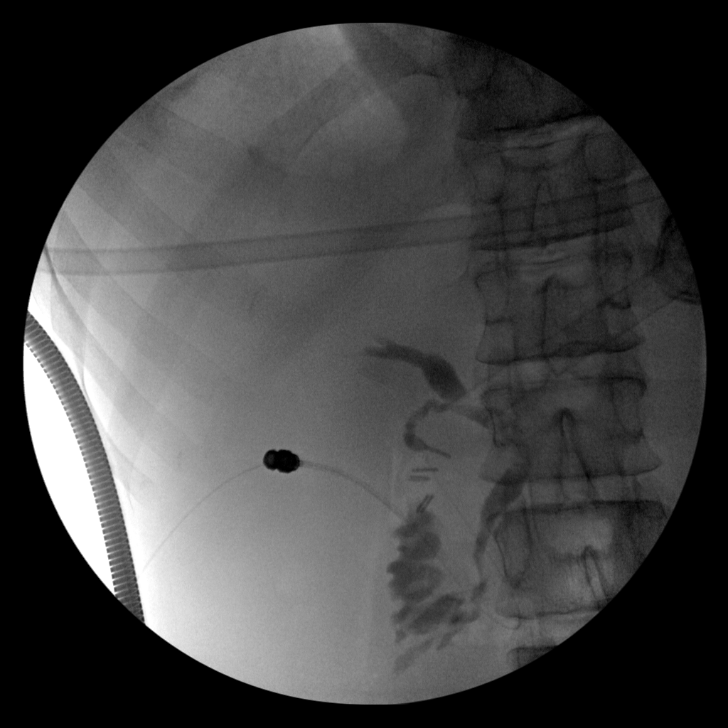
[im 2/4]
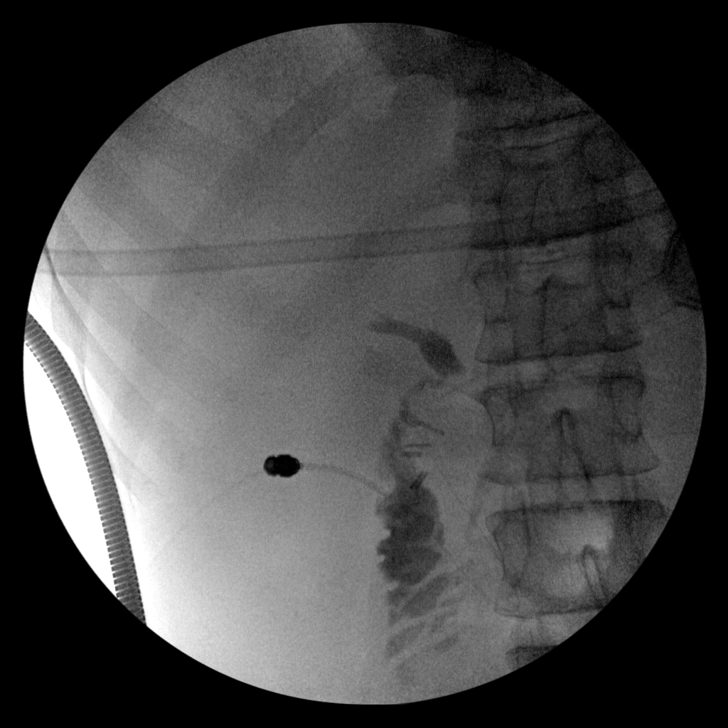
[im 2/4]
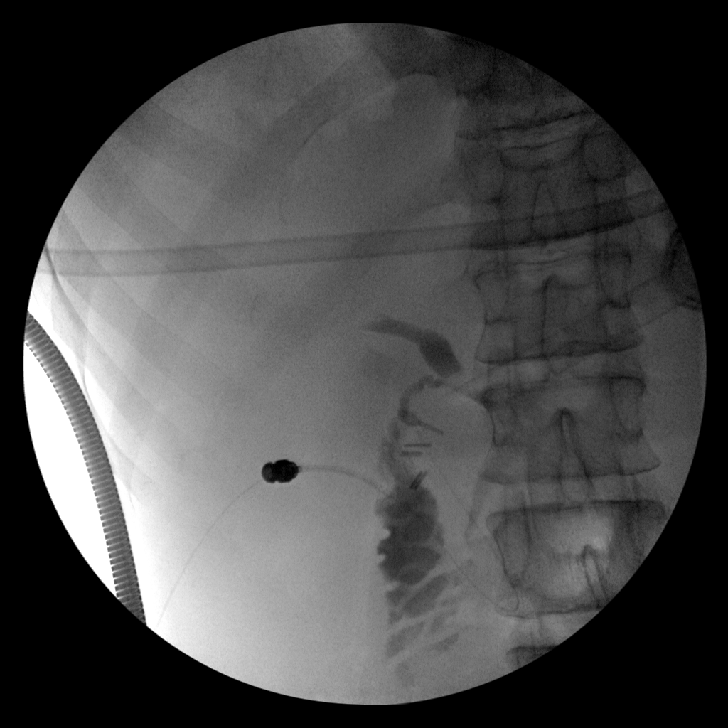
[im 2/4]
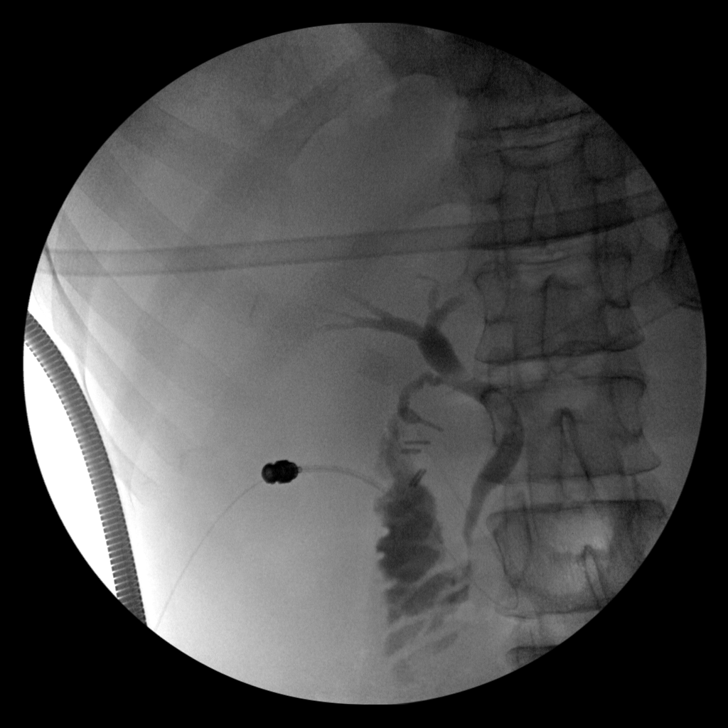
[im 2/4]
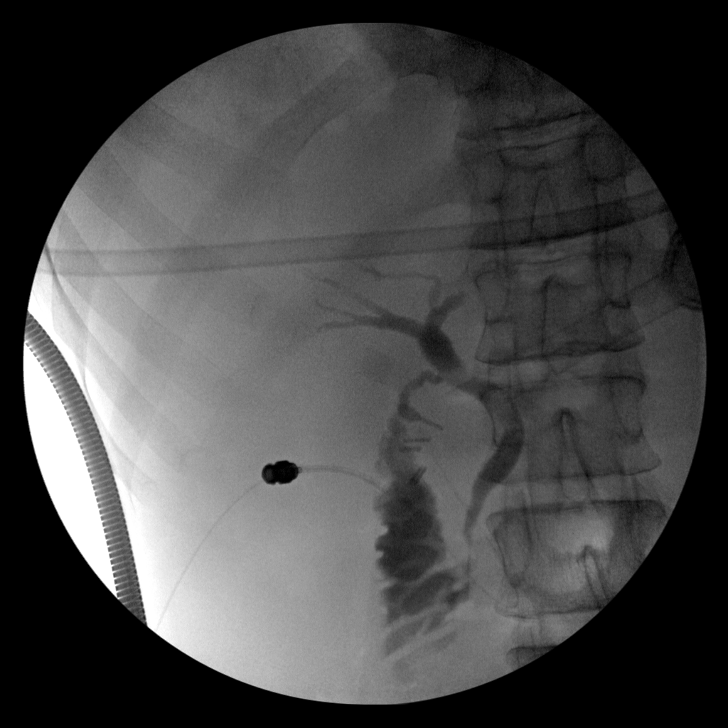
[im 3/4]
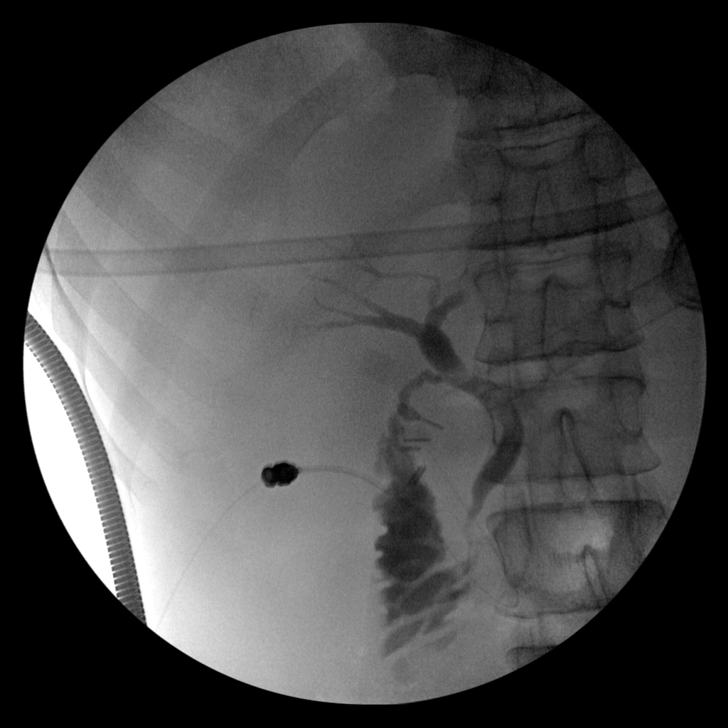
[im 4/4]
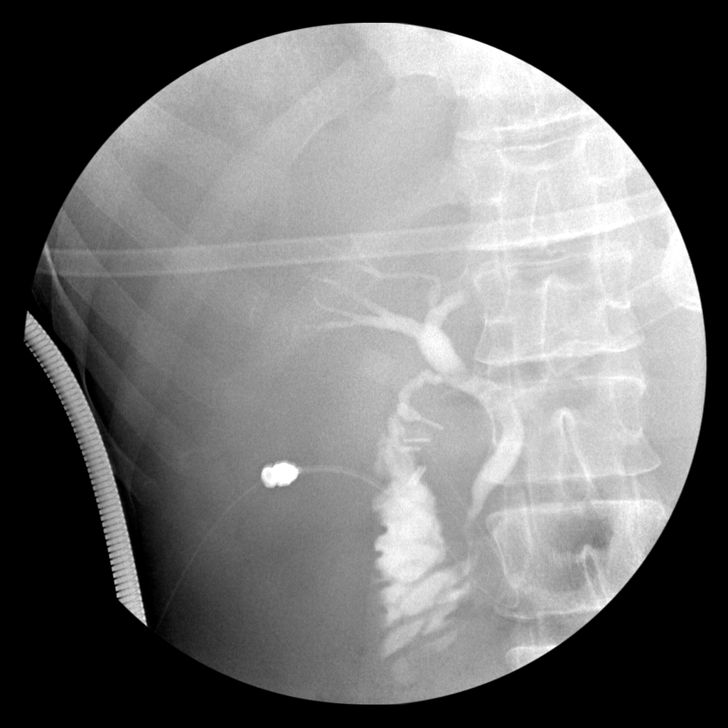

[10 of 10 positions shown; findings below may reference images not displayed]

FINDINGS: The cystic duct remnant is cannulated.  Contrast
opacifies the cystic duct and common bile duct to the bifurcation
at the porta hepatis.  There is filling of the common bile ducts
with spill of contrast into the duodenum.  There is a focal filling
defect within the midportion of the common bile duct that persists
on additional imaging.  This is worrisome for residual stones.
IMPRESSION: 1.  Filling defect in the common bile duct worrisome for residual
stones.
2.  No evidence for biliary leak.

## 2010-12-06 ENCOUNTER — Encounter (HOSPITAL_COMMUNITY): Payer: Self-pay

## 2010-12-06 ENCOUNTER — Emergency Department (HOSPITAL_COMMUNITY)
Admission: EM | Admit: 2010-12-06 | Discharge: 2010-12-06 | Disposition: A | Discharge status: Home / Self Care | Payer: Medicare Other | Attending: Emergency Medicine | Admitting: Emergency Medicine

## 2010-12-06 DIAGNOSIS — D5700 Hb-SS disease with crisis, unspecified: Secondary | ICD-10-CM | POA: Insufficient documentation

## 2010-12-06 LAB — CBC
HCT: 21.1 % — ABNORMAL LOW (ref 39.0–52.0)
Hemoglobin: 7 g/dL — ABNORMAL LOW (ref 13.0–17.0)
MCHC: 33.2 g/dL (ref 30.0–36.0)
MCV: 92.1 fL (ref 78.0–100.0)
RDW: 20 % — ABNORMAL HIGH (ref 11.5–15.5)

## 2010-12-06 MED ORDER — HYDROMORPHONE HCL PF 2 MG/ML IJ SOLN
INTRAMUSCULAR | Status: AC
  Filled 2010-12-06: qty 1

## 2010-12-06 MED ORDER — HYDROMORPHONE HCL PF 2 MG/ML IJ SOLN
2.0000 mg | Freq: Once | INTRAMUSCULAR | Status: AC
  Administered 2010-12-06: 2 mg via INTRAVENOUS

## 2010-12-06 NOTE — ED Notes (Signed)
Pt alert, oriented in room. sts pain is not improve after first pain medication. Pt re medicated for pain. Food and water provided

## 2010-12-06 NOTE — ED Notes (Signed)
Pt complains of sickle cell pain in his neck and sides since yesterday

## 2010-12-06 NOTE — ED Provider Notes (Signed)
History     CSN: [id number] Arrival date & time: 12/06/2010  6:19 AM   First MD Initiated Contact with Patient 12/06/10 [phone]      No chief complaint on file.   (Consider location/radiation/quality/duration/timing/severity/associated sxs/prior treatment) Patient is a 30 y.o. male presenting with sickle cell pain. The history is provided by the patient.  Sickle Cell Pain Crisis  This is a new problem.   reports typical sickle cell pain which is located in the left lateral chest.  His pain started this morning I will complete.  He also reports some discomfort in his left side of his neck.  The pain is 1010.  He has dilaudid and OxyContin at home however he is not taking these because he is concerned about depleting his home supply.  denies cough.  Denies congestion and fever.  Denies any weakness.  Nothing worsens the symptoms.  Nothing improves his symptoms.  His symptoms are severe and sharp and aching in nature  Past Medical History  Diagnosis Date  . Sickle cell anemia     History reviewed. No pertinent past surgical history.  History reviewed. No pertinent family history.  History  Substance Use Topics  . Smoking status: Not on file  . Smokeless tobacco: Not on file  . Alcohol Use: Not on file      Review of Systems  All other systems reviewed and are negative.    Allergies  Morphine and related  Home Medications   Current Outpatient Rx  Name Route Sig Dispense Refill  . HYDROMORPHONE HCL 4 MG PO TABS Oral Take 4 mg by mouth every 4 (four) hours as needed.      Marland Kitchen HYDROXYUREA 500 MG PO CAPS Oral Take 500 mg by mouth 2 (two) times daily. May take with food to minimize GI side effects.    . IBUPROFEN 800 MG PO TABS Oral Take 800 mg by mouth every 8 (eight) hours as needed. pain    . OXYCODONE HCL 40 MG PO TB12 Oral Take 40 mg by mouth every 12 (twelve) hours. pain    . ZOLPIDEM TARTRATE 10 MG PO TABS Oral Take 10 mg by mouth at bedtime as needed. sleep      BP  110/67  Pulse 63  Temp(Src) 98.4 F (36.9 C) (Oral)  Resp 20  SpO2 100%  Physical Exam  Nursing note and vitals reviewed. Constitutional: He is oriented to person, place, and time. He appears well-developed and well-nourished.  HENT:  Head: Normocephalic and atraumatic.  Eyes: EOM are normal.  Neck: Normal range of motion.  Cardiovascular: Normal rate, regular rhythm, normal heart sounds and intact distal pulses.   Pulmonary/Chest: Effort normal and breath sounds normal. No respiratory distress. He exhibits no tenderness.  Abdominal: Soft. He exhibits no distension. There is no tenderness.  Musculoskeletal: Normal range of motion. He exhibits no tenderness.  Neurological: He is alert and oriented to person, place, and time.  Skin: Skin is warm and dry.  Psychiatric: He has a normal mood and affect. Judgment normal.    ED Course  Procedures (including critical care time)   Labs Reviewed  CBC   No results found.   1. Sickle cell crisis       MDM  Typical sickle cell pain for the patient.  Pain will be treated at this time.  In some delays in treating his pain secondary to delays in obtaining IV access.  This is being worked on currently  9:40 AM Patient feels  much better at this time.  We'll discharge home.  Patient with pain medicine at home       Lyanne Co, MD 12/06/10 [phone]

## 2010-12-06 NOTE — ED Notes (Signed)
Secondary Assessment: pt c/o sickle cell crisis started yesterday, pain at both sides, 8/10, sts he get sickle cell crisis every month. Pt denied chest pain, sob, nausea, vomtting. Pt alert, oriented, breath sound clear. Airway intact.

## 2010-12-06 NOTE — ED Notes (Signed)
Discharge instruction to patient. IV discontinue. Pt denied any questions.

## 2010-12-07 IMAGING — CR DG CHEST 2V
2 series · 2 of 2 positions shown · non-contrast
Comparison: Portable chest 01/08/2008 and CT chest 01/09/2008.

CLINICAL DATA: Chest pain.

CHEST - 2 VIEW

[w chest pa]
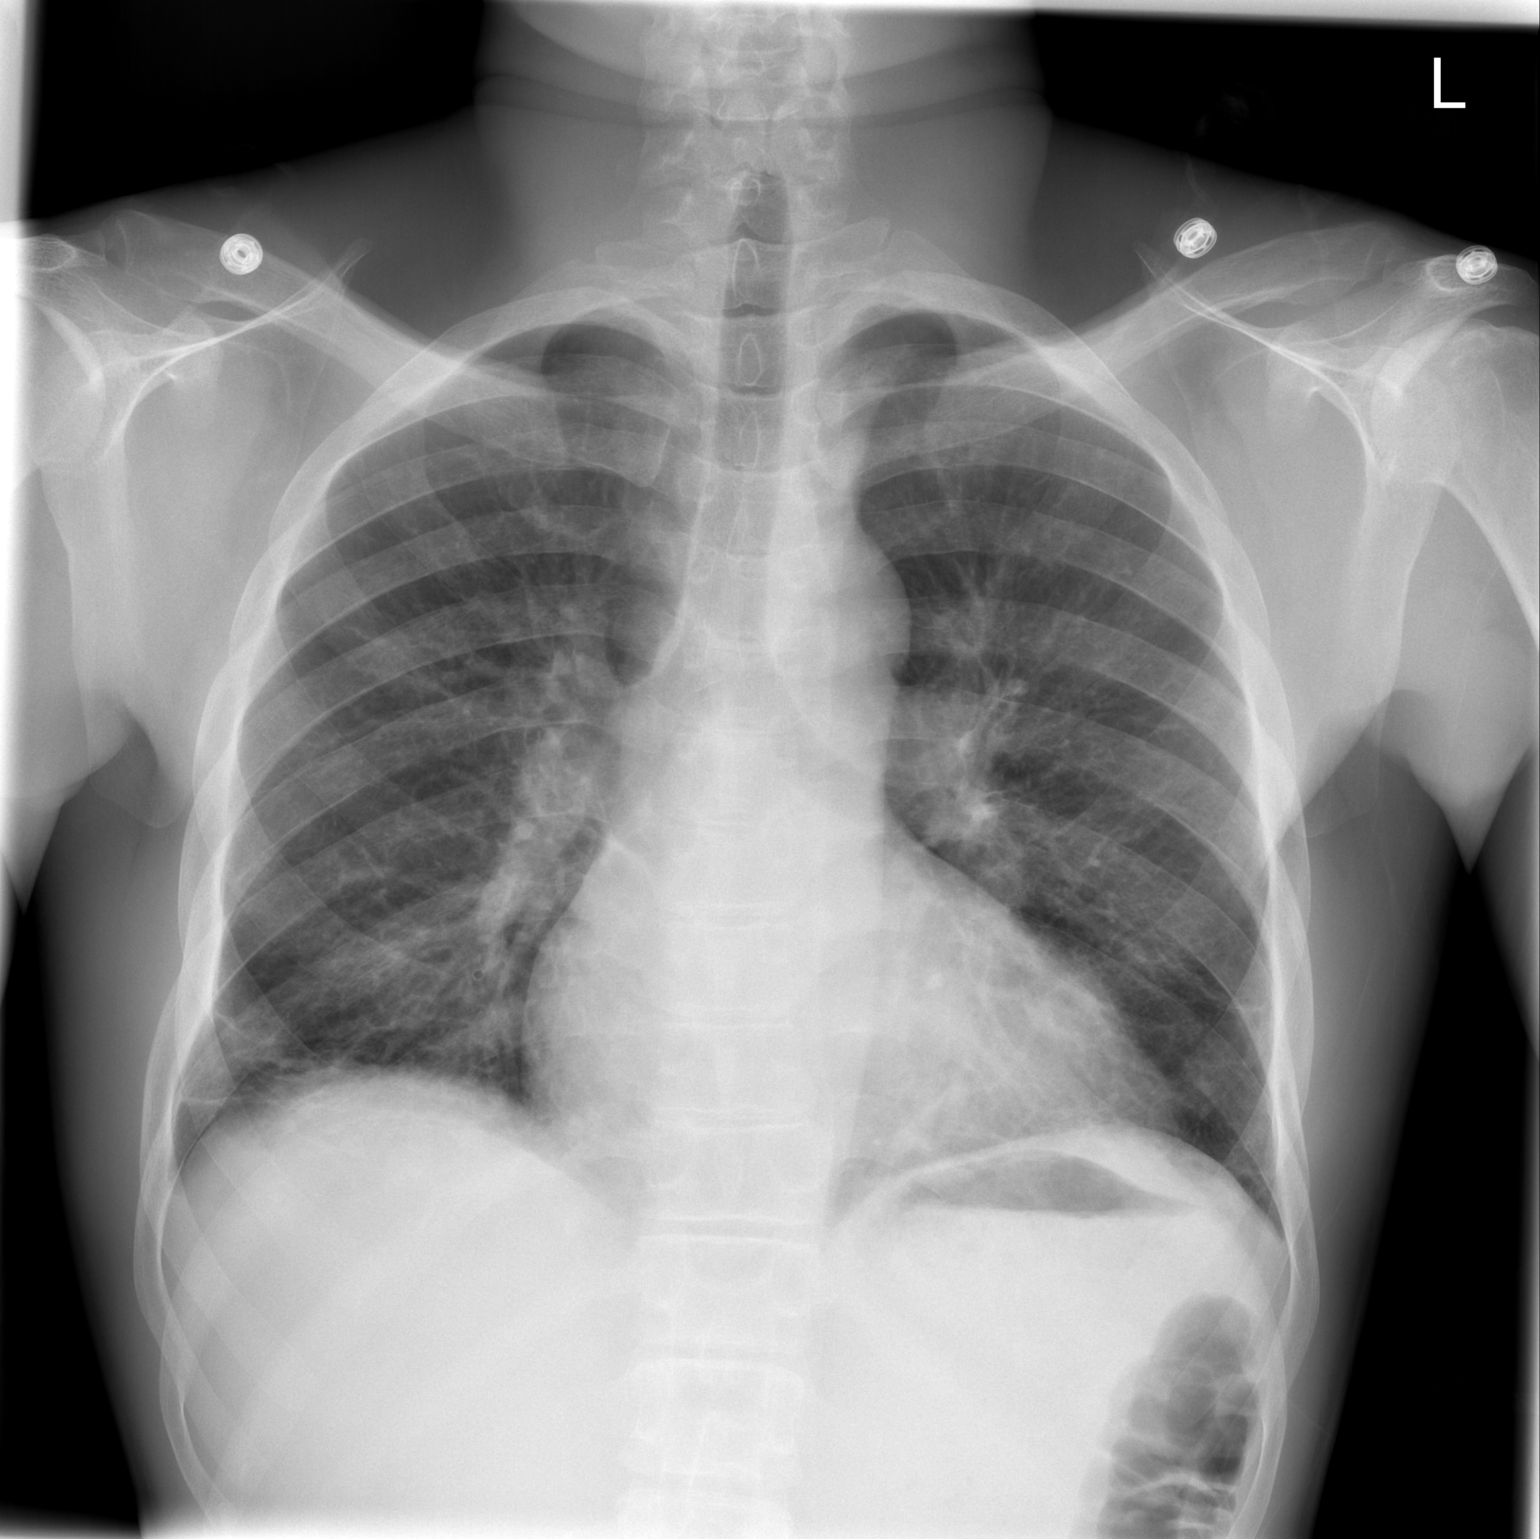

[w chest lat]
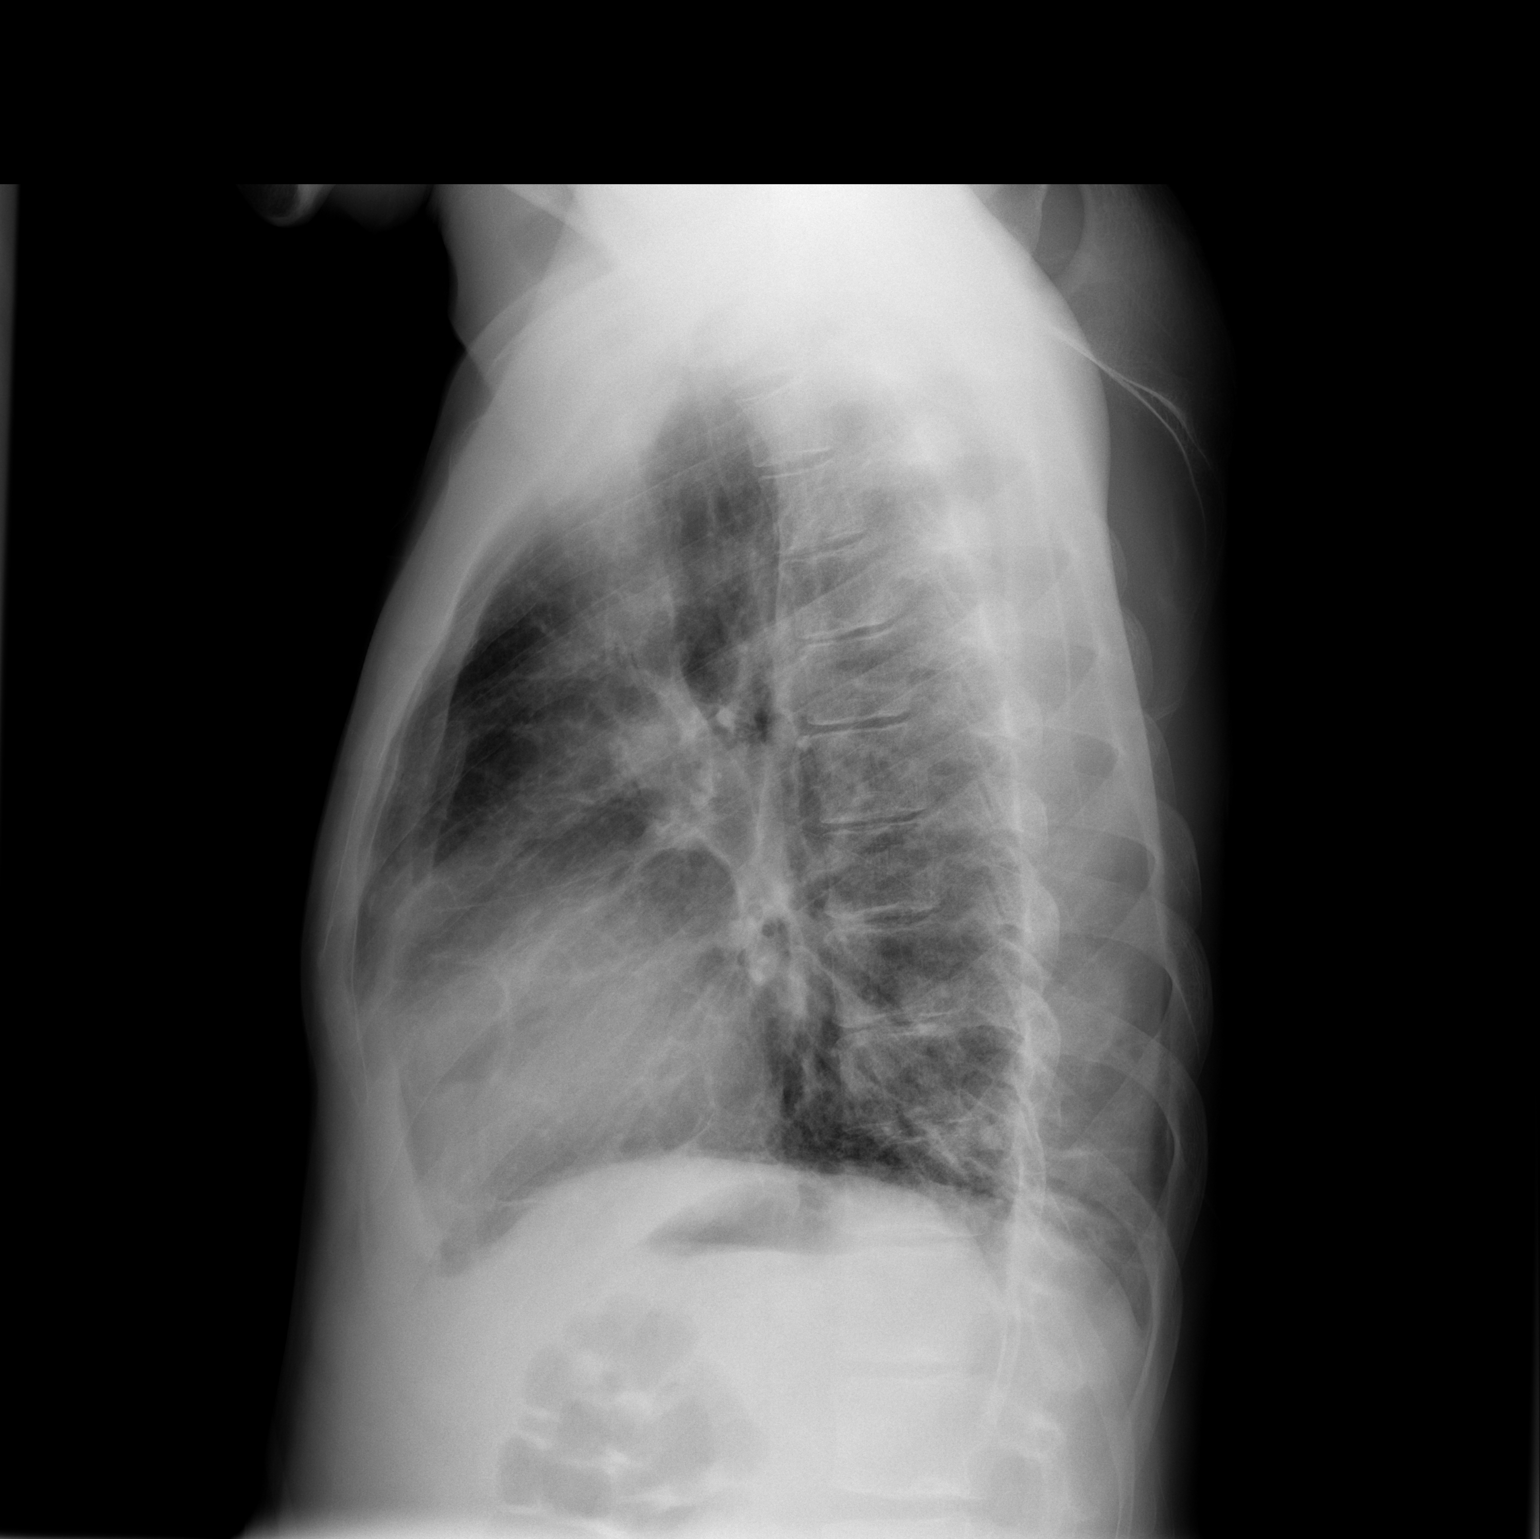

[2 of 2 positions shown; findings below may reference images not displayed]

FINDINGS: There is some linear atelectasis in the periphery the
right lung base.  Lungs are otherwise clear.  No pleural effusion.
Cardiomegaly noted.
IMPRESSION: 1.  No acute finding.
2.  Mild linear atelectasis right lower lobe.
3.  Cardiomegaly.

## 2010-12-08 ENCOUNTER — Emergency Department (HOSPITAL_COMMUNITY)
Admission: EM | Admit: 2010-12-08 | Discharge: 2010-12-08 | Disposition: A | Discharge status: Home / Self Care | Payer: Medicare Other | Attending: Emergency Medicine | Admitting: Emergency Medicine

## 2010-12-08 ENCOUNTER — Encounter (HOSPITAL_COMMUNITY): Payer: Self-pay | Admitting: *Deleted

## 2010-12-08 DIAGNOSIS — D5700 Hb-SS disease with crisis, unspecified: Secondary | ICD-10-CM | POA: Insufficient documentation

## 2010-12-08 LAB — CBC
Hemoglobin: 8.1 g/dL — ABNORMAL LOW (ref 13.0–17.0)
MCH: 30.6 pg (ref 26.0–34.0)
MCHC: 33.2 g/dL (ref 30.0–36.0)
MCV: 92.1 fL (ref 78.0–100.0)
Platelets: 648 10*3/uL — ABNORMAL HIGH (ref 150–400)
RBC: 2.65 MIL/uL — ABNORMAL LOW (ref 4.22–5.81)

## 2010-12-08 MED ORDER — HYDROMORPHONE HCL PF 2 MG/ML IJ SOLN
2.0000 mg | Freq: Once | INTRAMUSCULAR | Status: AC
  Administered 2010-12-08: 2 mg via INTRAVENOUS
  Filled 2010-12-08: qty 1

## 2010-12-08 MED ORDER — HYDROMORPHONE HCL PF 1 MG/ML IJ SOLN
1.0000 mg | Freq: Once | INTRAMUSCULAR | Status: AC
  Administered 2010-12-08: 1 mg via INTRAVENOUS
  Filled 2010-12-08: qty 1

## 2010-12-08 MED ORDER — ONDANSETRON HCL 4 MG/2ML IJ SOLN
4.0000 mg | Freq: Once | INTRAMUSCULAR | Status: AC
  Administered 2010-12-08: 4 mg via INTRAVENOUS
  Filled 2010-12-08: qty 2

## 2010-12-08 NOTE — ED Notes (Signed)
Pt instructed to continue sickle cell meds and to follow up with Md. [name] tomorrow pt verbalized understanding

## 2010-12-08 NOTE — ED Provider Notes (Signed)
History     CSN: [id number] Arrival date & time: 12/08/2010  9:19 AM   First MD Initiated Contact with Patient 12/08/10 [phone]      Chief Complaint  Patient presents with  . Sickle Cell Pain Crisis     The history is provided by the patient.   patient reports development of his typical sickle cell pain which involves pain in his bilateral rib cages.  This pain started several days ago.  She reports it began hurting more today.  His pain as a 10 out of 10.  He denies cough denies fever or chills nausea and vomiting.  His abdominal pain.  He reports he is compliant with his oxycodone and his hydroxyurea as well as his ibuprofen and Dilaudid at home.  His wife does report ongoing pain.  He was in the emergency department 2 days ago for the same was evaluated and treated and sent home with improvement in his pain after IV narcotic medicines.  Past Medical History  Diagnosis Date  . Sickle cell anemia     Past Surgical History  Procedure Date  . Joint replacement     right hip    History reviewed. No pertinent family history.  History  Substance Use Topics  . Smoking status: Former Games developer  . Smokeless tobacco: Not on file  . Alcohol Use: No      Review of Systems  All other systems reviewed and are negative.    Allergies  Morphine and related  Home Medications   Current Outpatient Rx  Name Route Sig Dispense Refill  . HYDROMORPHONE HCL 4 MG PO TABS Oral Take 4 mg by mouth every 4 (four) hours as needed. For pain     . HYDROXYUREA 500 MG PO CAPS Oral Take 500 mg by mouth 2 (two) times daily. May take with food to minimize GI side effects.    . IBUPROFEN 800 MG PO TABS Oral Take 800 mg by mouth every 8 (eight) hours as needed. pain    . OXYCODONE HCL 40 MG PO TB12 Oral Take 40 mg by mouth every 12 (twelve) hours. pain    . ZOLPIDEM TARTRATE 10 MG PO TABS Oral Take 10 mg by mouth at bedtime as needed. sleep      Wt 170 lb (77.111 kg)  Physical Exam  Nursing note and  vitals reviewed. Constitutional: He is oriented to person, place, and time. He appears well-developed and well-nourished.  HENT:  Head: Normocephalic and atraumatic.  Eyes: EOM are normal.  Neck: Normal range of motion.  Cardiovascular: Normal rate, regular rhythm, normal heart sounds and intact distal pulses.   Pulmonary/Chest: Effort normal and breath sounds normal. No respiratory distress. He exhibits no tenderness.  Abdominal: Soft. He exhibits no distension. There is no tenderness.  Musculoskeletal: Normal range of motion. He exhibits no tenderness.  Neurological: He is alert and oriented to person, place, and time.  Skin: Skin is warm and dry.  Psychiatric: He has a normal mood and affect. Judgment normal.    ED Course  Procedures (including critical care time)  Labs Reviewed  CBC - Abnormal; Notable for the following:    WBC 18.6 (*)    RBC 2.65 (*)    Hemoglobin 8.1 (*)    HCT 24.4 (*)    RDW 19.4 (*)    Platelets 648 (*)    All other components within normal limits   No results found.   1. Sickle cell crisis  MDM  Typical sickle cell pain.  We'll attempt to control the patient's pain at this time my suspicion for acute chest is low.  Doubt abdominal pathology.  5:27 PM Patient eating now. Reports improvement in his pain. Dc home in good condition        Lyanne Co, MD 12/08/10 1730

## 2010-12-08 NOTE — ED Notes (Signed)
Pt states "woke up this morning about 5 or 6 with my ribs and back hurting"; pt indicates left ribs & back area

## 2010-12-08 NOTE — ED Notes (Signed)
Pt medicated per md orders. Pt states pain is 6/10.

## 2010-12-08 NOTE — ED Notes (Signed)
Pt requesting pain med md [place] notified and aware

## 2010-12-08 NOTE — ED Notes (Signed)
Pt requesting nausea and pain meds md notified and aware

## 2010-12-11 IMAGING — CR DG CHEST 2V
2 series · 2 of 2 positions shown · non-contrast
Comparison: 01/14/2008

CLINICAL DATA: Chest pain and cough for 3 days.  Sickle cell
crisis.

CHEST - 2 VIEW

[w chest pa]
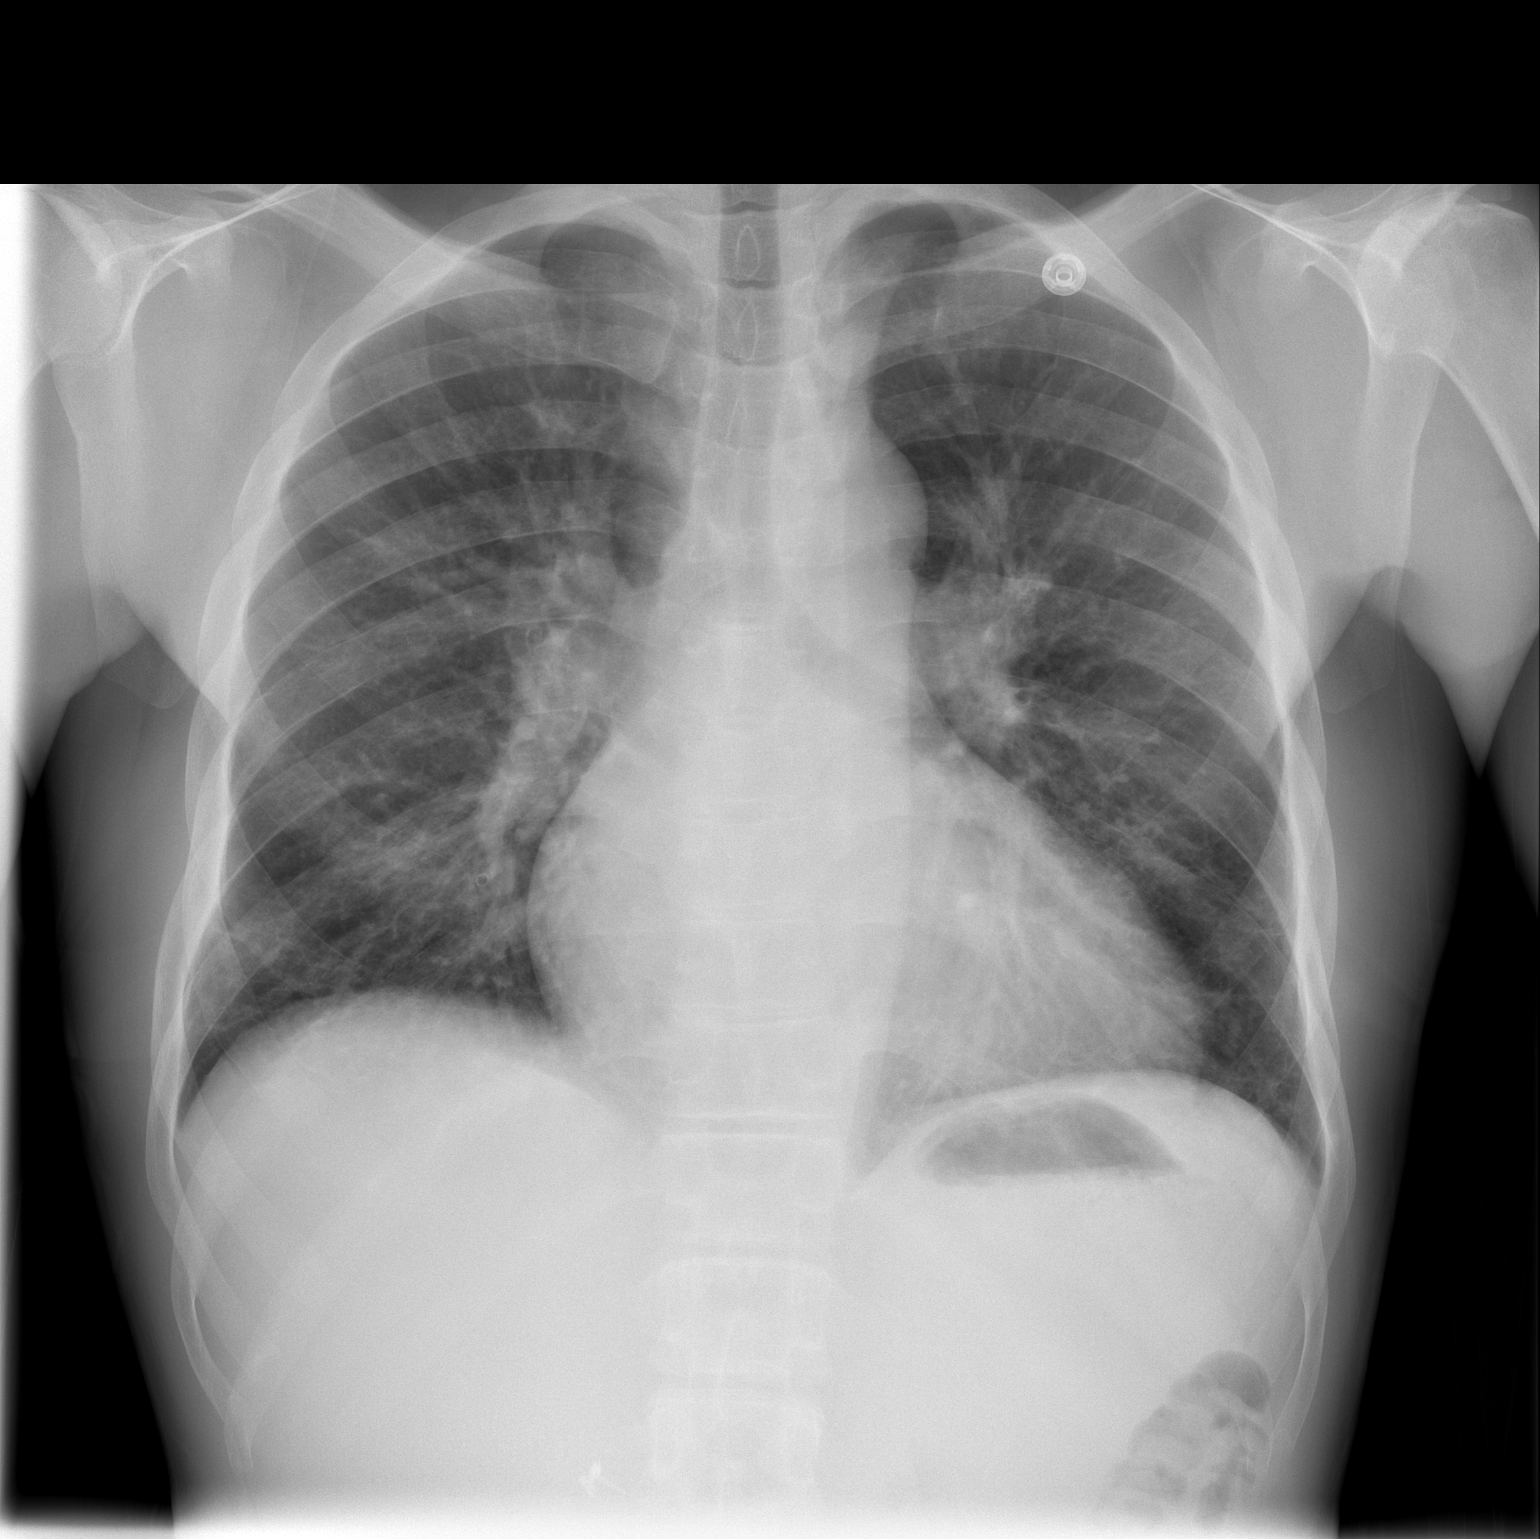

[w chest lat]
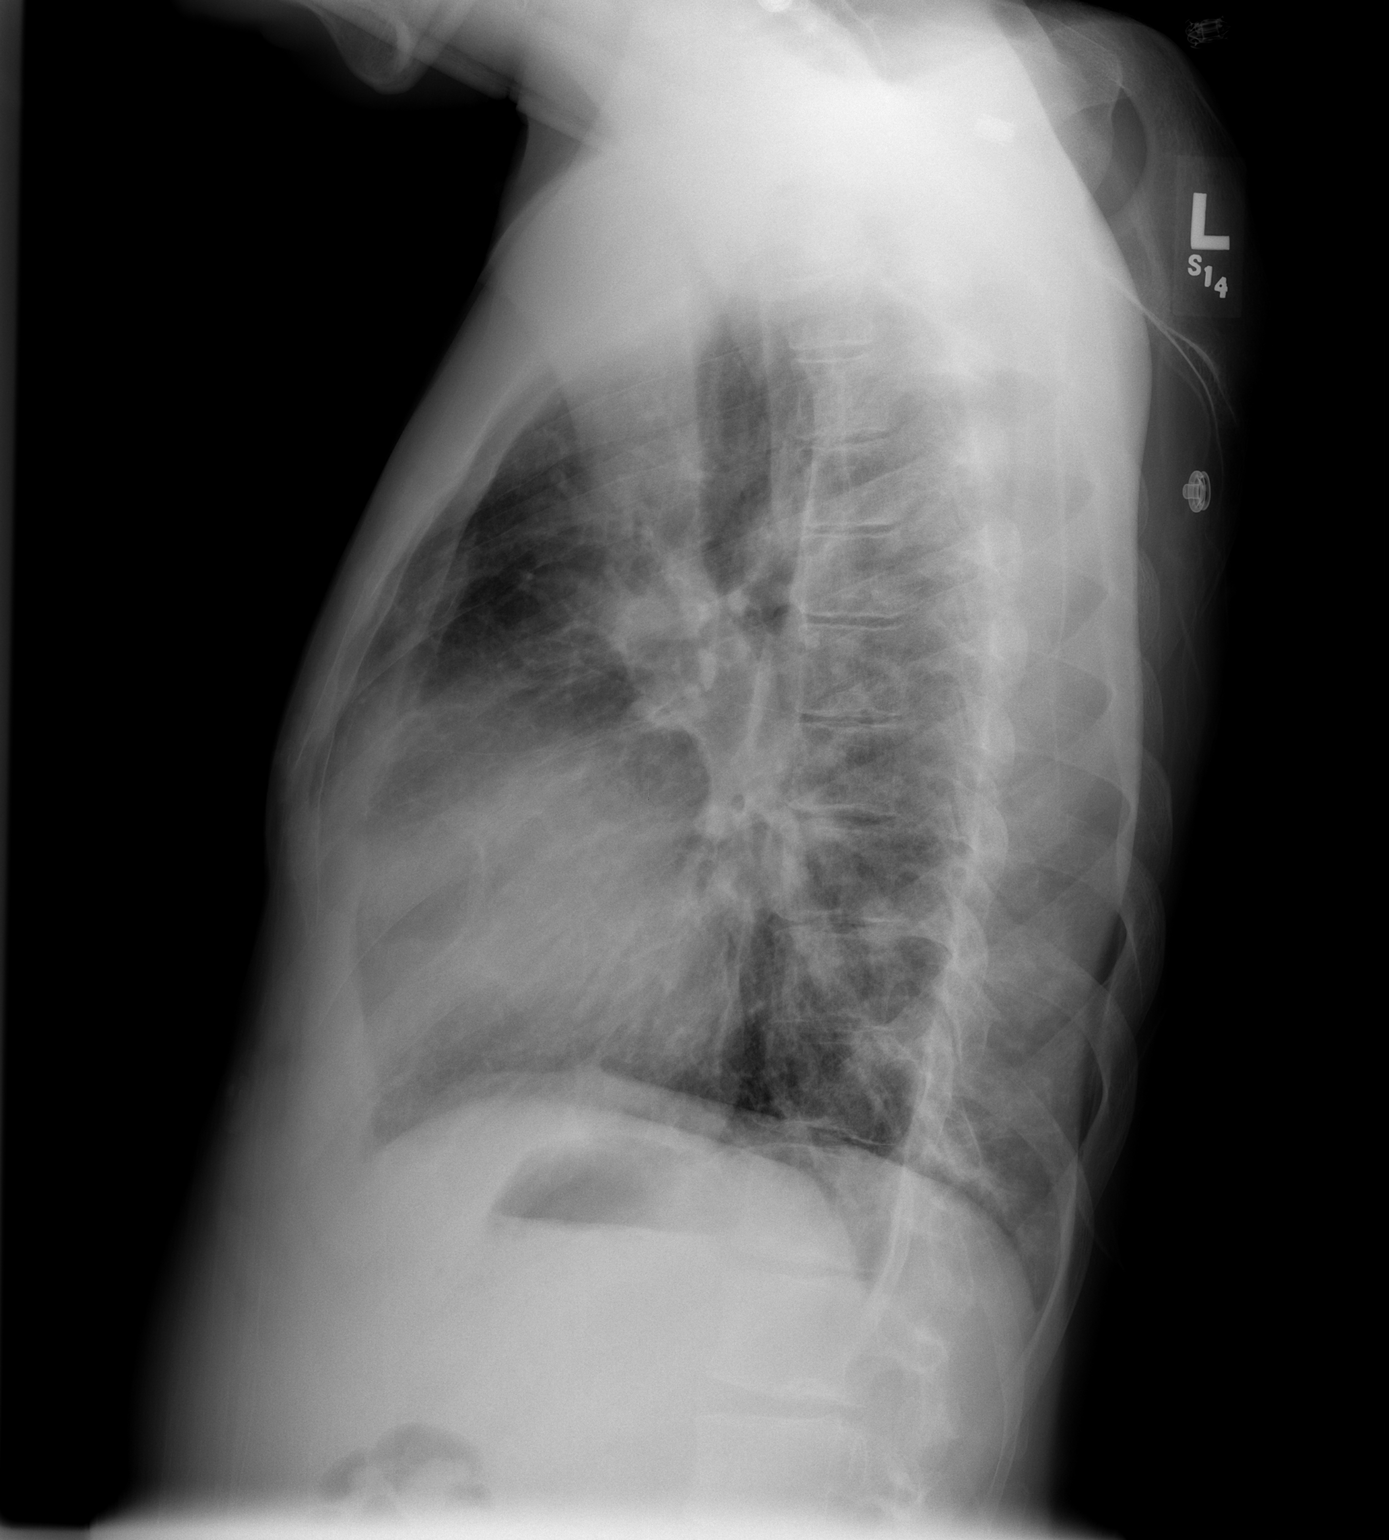

[2 of 2 positions shown; findings below may reference images not displayed]

FINDINGS: Cardiomegaly is present along with cephalization of blood
flow in the lungs.  There is stable scarring or subsegmental
atelectasis in the right lower lobe along with mild prominence of
the lung interstitium.  Mild lingular scarring or subsegmental
atelectasis is also present.

No pleural effusion noted.
IMPRESSION: 1.  Lingular and right basilar scarring or subsegmental atelectasis
along with mild cardiomegaly and chronic cephalization of blood
flow, likely related to sickle cell disease.  No overt new opacity
or pleural effusion identified.

## 2010-12-14 IMAGING — CR DG CHEST 2V
2 series · 2 of 2 positions shown · non-contrast
Comparison: 01/18/2008

CLINICAL DATA: Sickle cell disease/acute chest pain

CHEST - 2 VIEW

[w chest pa]
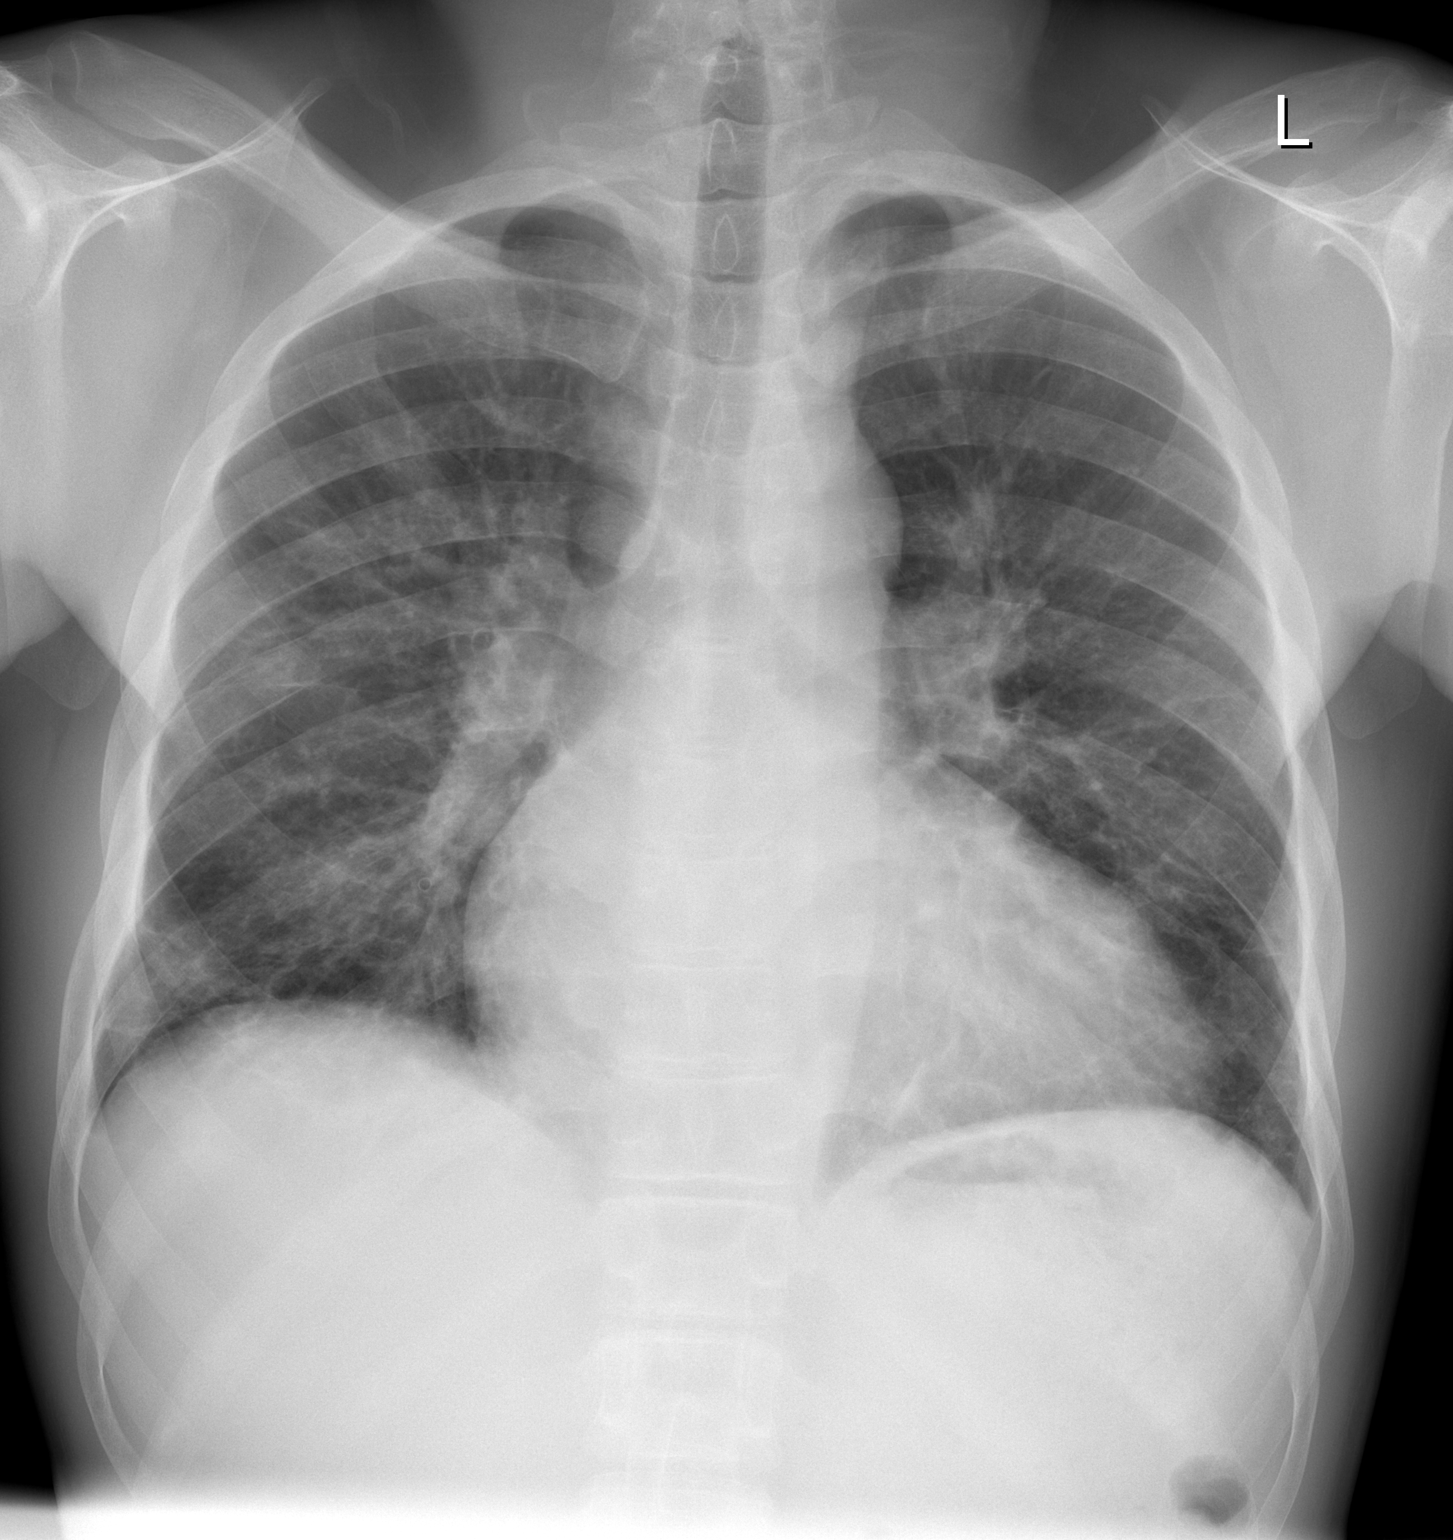

[w chest lat]
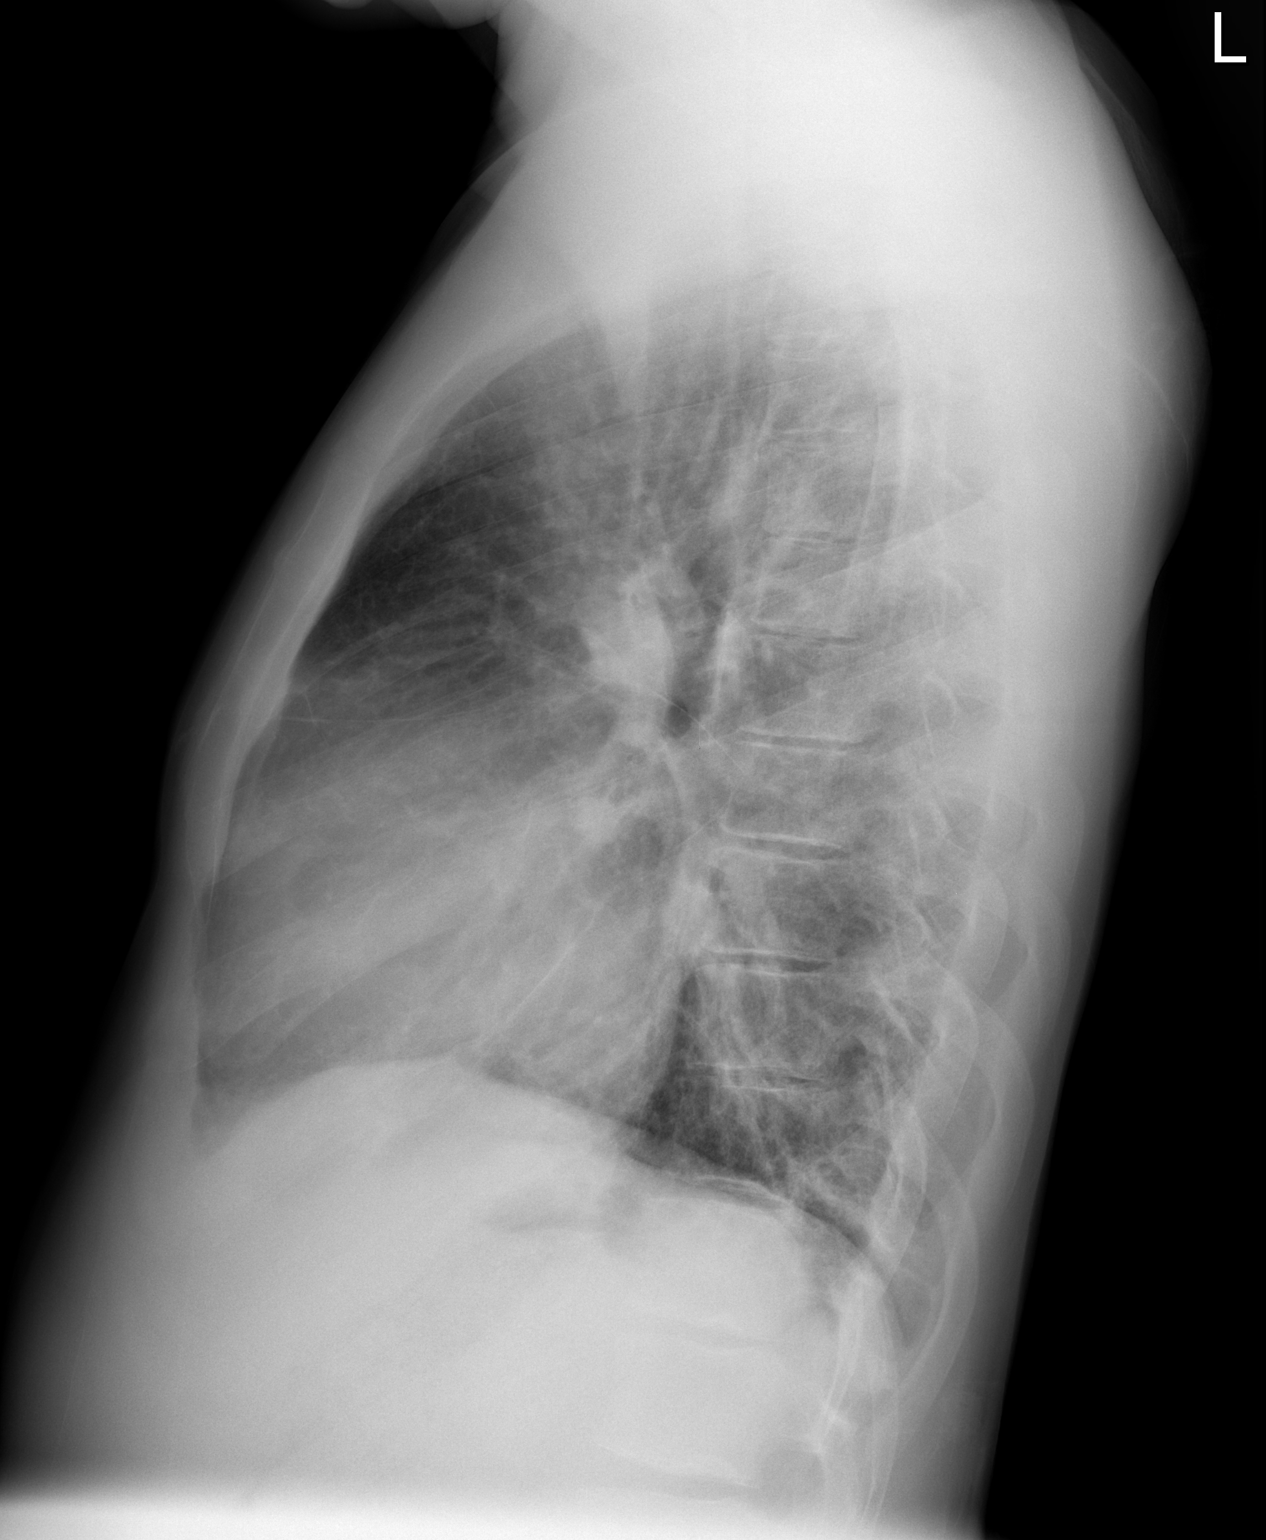

[2 of 2 positions shown; findings below may reference images not displayed]

FINDINGS: The heart is enlarged, and is significantly larger when
compared to the prior study.  There is no frank congestive heart
failure.  There is bilateral lung scarring without active airspace
disease or pleural fluid.
IMPRESSION: 1.  Cardiomegaly with interval increase in heart size.
2.  Chronic scarring in the lungs.
3.  No congestive heart failure or active disease.

## 2010-12-16 ENCOUNTER — Inpatient Hospital Stay (HOSPITAL_COMMUNITY)
Admission: EM | Admit: 2010-12-16 | Discharge: 2010-12-23 | DRG: 812 | Disposition: A | Discharge status: Home / Self Care | Payer: Medicare Other | Attending: Internal Medicine | Admitting: Internal Medicine

## 2010-12-16 ENCOUNTER — Encounter (HOSPITAL_COMMUNITY): Payer: Self-pay | Admitting: Emergency Medicine

## 2010-12-16 ENCOUNTER — Emergency Department (HOSPITAL_COMMUNITY): Payer: Medicare Other

## 2010-12-16 ENCOUNTER — Other Ambulatory Visit: Payer: Self-pay

## 2010-12-16 DIAGNOSIS — D649 Anemia, unspecified: Secondary | ICD-10-CM

## 2010-12-16 DIAGNOSIS — G8929 Other chronic pain: Secondary | ICD-10-CM | POA: Diagnosis present

## 2010-12-16 DIAGNOSIS — E876 Hypokalemia: Secondary | ICD-10-CM | POA: Diagnosis present

## 2010-12-16 DIAGNOSIS — Z96649 Presence of unspecified artificial hip joint: Secondary | ICD-10-CM

## 2010-12-16 DIAGNOSIS — D571 Sickle-cell disease without crisis: Secondary | ICD-10-CM

## 2010-12-16 DIAGNOSIS — Z86718 Personal history of other venous thrombosis and embolism: Secondary | ICD-10-CM

## 2010-12-16 DIAGNOSIS — D5700 Hb-SS disease with crisis, unspecified: Principal | ICD-10-CM | POA: Diagnosis present

## 2010-12-16 DIAGNOSIS — D72829 Elevated white blood cell count, unspecified: Secondary | ICD-10-CM | POA: Diagnosis present

## 2010-12-16 LAB — DIFFERENTIAL
Basophils Relative: 1 % (ref 0–1)
Eosinophils Relative: 0 % (ref 0–5)
Lymphs Abs: 2.1 10*3/uL (ref 0.7–4.0)
Monocytes Absolute: 2.3 10*3/uL — ABNORMAL HIGH (ref 0.1–1.0)

## 2010-12-16 LAB — COMPREHENSIVE METABOLIC PANEL
Alkaline Phosphatase: 79 U/L (ref 39–117)
BUN: 4 mg/dL — ABNORMAL LOW (ref 6–23)
Chloride: 105 mEq/L (ref 96–112)
GFR calc Af Amer: >90 mL/min (ref >90)
Glucose, Bld: 102 mg/dL — ABNORMAL HIGH (ref 70–99)
Potassium: 3.3 mEq/L — ABNORMAL LOW (ref 3.5–5.1)
Total Bilirubin: 5.6 mg/dL — ABNORMAL HIGH (ref 0.3–1.2)

## 2010-12-16 LAB — URINALYSIS, ROUTINE W REFLEX MICROSCOPIC
Bilirubin Urine: NEGATIVE
Ketones, ur: NEGATIVE mg/dL
Nitrite: NEGATIVE
Urobilinogen, UA: 1 mg/dL (ref 0.0–1.0)

## 2010-12-16 LAB — CARDIAC PANEL(CRET KIN+CKTOT+MB+TROPI)
Relative Index: INVALID (ref 0.0–2.5)
Troponin I: <0.3 ng/mL (ref <0.30)

## 2010-12-16 LAB — CBC
Hemoglobin: 7.9 g/dL — ABNORMAL LOW (ref 13.0–17.0)
MCH: 30.2 pg (ref 26.0–34.0)
MCV: 90.5 fL (ref 78.0–100.0)
Platelets: 549 10*3/uL — ABNORMAL HIGH (ref 150–400)
RBC: 2.62 MIL/uL — ABNORMAL LOW (ref 4.22–5.81)

## 2010-12-16 LAB — RETICULOCYTES: Retic Count, Absolute: 309.2 10*3/uL — ABNORMAL HIGH (ref 19.0–186.0)

## 2010-12-16 LAB — D-DIMER, QUANTITATIVE: D-Dimer, Quant: 2.39 ug/mL-FEU — ABNORMAL HIGH (ref 0.00–0.48)

## 2010-12-16 MED ORDER — SODIUM CHLORIDE 0.9 % IV BOLUS (SEPSIS): 1000.0000 mL | Freq: Once | INTRAVENOUS | Status: DC

## 2010-12-16 MED ORDER — SODIUM CHLORIDE 0.9 % IV BOLUS (SEPSIS)
1000.0000 mL | Freq: Once | INTRAVENOUS | Status: AC
  Administered 2010-12-16: 600 mL via INTRAVENOUS

## 2010-12-16 MED ORDER — IOHEXOL 300 MG/ML  SOLN
100.0000 mL | Freq: Once | INTRAMUSCULAR | Status: AC | PRN
  Administered 2010-12-16: 100 mL via INTRAVENOUS

## 2010-12-16 MED ORDER — HYDROMORPHONE HCL PF 2 MG/ML IJ SOLN
INTRAMUSCULAR | Status: AC
  Filled 2010-12-16: qty 1

## 2010-12-16 MED ORDER — HYDROMORPHONE HCL PF 2 MG/ML IJ SOLN
2.0000 mg | Freq: Once | INTRAMUSCULAR | Status: AC
  Administered 2010-12-16 – 2010-12-17 (×2): 2 mg via INTRAVENOUS
  Filled 2010-12-16: qty 1

## 2010-12-16 MED ORDER — HYDROMORPHONE HCL PF 1 MG/ML IJ SOLN
2.0000 mg | INTRAMUSCULAR | Status: DC | PRN
  Administered 2010-12-16: 2 mg via INTRAVENOUS

## 2010-12-16 MED ORDER — HYDROMORPHONE HCL PF 2 MG/ML IJ SOLN
2.0000 mg | Freq: Once | INTRAMUSCULAR | Status: AC
  Administered 2010-12-16: 2 mg via INTRAVENOUS
  Filled 2010-12-16: qty 1

## 2010-12-16 MED ORDER — ONDANSETRON HCL 4 MG/2ML IJ SOLN
4.0000 mg | Freq: Once | INTRAMUSCULAR | Status: AC
  Administered 2010-12-16: 4 mg via INTRAVENOUS
  Filled 2010-12-16: qty 2

## 2010-12-16 NOTE — ED Notes (Signed)
Pre Pt, while at work began feeling short of breath with 8/10 pain in sides and back. Pt c/o now feeling more winded than normal.

## 2010-12-16 NOTE — H&P (Signed)
PCP:   Gerald Cashing, MD   Chief Complaint:  Rib and thigh pain  HPI: 30 yo aam with h/o ss comes in with rib and thigh pain typical for his ss crisis.  Pt of dr. [name].  Denies any fever/n/v/d/cough.  Does have some mild sob.  Has h/o dvt in past completed course of coumadin.  Ct done shows no pe.  Denies any le swelling.  No rashes.    Review of Systems:  O/w neg  Past Medical History: Past Medical History  Diagnosis Date  . Sickle cell anemia    Past Surgical History  Procedure Date  . Joint replacement     right hip    Medications: Prior to Admission medications   Medication Sig Start Date End Date Taking? Authorizing Provider  HYDROmorphone (DILAUDID) 4 MG tablet Take 4 mg by mouth every 4 (four) hours as needed. For pain   Yes Historical Provider, MD  hydroxyurea (HYDREA) 500 MG capsule Take 500 mg by mouth 2 (two) times daily. May take with food to minimize GI side effects.   Yes Historical Provider, MD  ibuprofen (ADVIL,MOTRIN) 800 MG tablet Take 800 mg by mouth every 8 (eight) hours as needed. pain   Yes Historical Provider, MD  oxyCODONE (OXYCONTIN) 40 MG 12 hr tablet Take 40 mg by mouth every 12 (twelve) hours. pain   Yes Historical Provider, MD  zolpidem (AMBIEN) 10 MG tablet Take 10 mg by mouth at bedtime as needed. sleep   Yes Historical Provider, MD    Allergies:   Allergies  Allergen Reactions  . Morphine And Related Rash    Social History:  reports that he has quit smoking. He does not have any smokeless tobacco history on file. He reports that he does not drink alcohol or use illicit drugs.  Family History: No family history on file.  Physical Exam: Filed Vitals:   12/16/10 1533 12/16/10 1536 12/16/10 1538  BP:  127/74   Pulse:  92   Temp:  98.8 F (37.1 C)   TempSrc:  Oral   Resp:  16   Weight:  77.384 kg (170 lb 9.6 oz)   SpO2: 98% 98% 98%   General appearance: alert, cooperative and no distress Resp: clear to auscultation  bilaterally Cardio: regular rate and rhythm, S1, S2 normal, no murmur, click, rub or gallop GI: soft, non-tender; bowel sounds normal; no masses,  no organomegaly Extremities: extremities normal, atraumatic, no cyanosis or edema Pulses: 2+ and symmetric Skin: Skin color, texture, turgor normal. No rashes or lesions Neurologic: Grossly normal   Labs on Admission:   [facility] 12/16/10 1629  NA 138  K 3.3*  CL 105  CO2 20  GLUCOSE 102*  BUN 4*  CREATININE 0.57  CALCIUM 9.4  MG --  PHOS --    Basename 12/16/10 1629  AST 33  ALT 17  ALKPHOS 79  BILITOT 5.6*  PROT 8.0  ALBUMIN 4.5    Basename 12/16/10 1629  WBC 25.7*  NEUTROABS 21.0*  HGB 7.9*  HCT 23.7*  MCV 90.5  PLT 549*    Basename 12/16/10 1629  CKTOTAL 28  CKMB 0.9  CKMBINDEX --  TROPONINI <0.30    Basename 12/16/10 1629  VITAMINB12 --  FOLATE --  FERRITIN --  TIBC --  IRON --  RETICCTPCT 11.8*    Radiological Exams on Admission: Dg Chest 2 View  12/16/2010  *RADIOLOGY REPORT*  Clinical Data: Shortness of breath.  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  CHEST - 2 VIEW  Comparison: 11/07/2010  Findings: Chronic cardiomegaly.  Interstitial markings are accentuated with tiny Kerley B lines at the bases suggesting mild interstitial edema.  No effusions.  No osseous abnormality.  IMPRESSION: Mild cardiomegaly with probable slight interstitial edema.  Original Report Authenticated By: Gwynn Burly, M.D.   Ct Angio Chest W/cm &/or Wo Cm  12/16/2010  *RADIOLOGY REPORT*  Clinical Data:  Chest pain today.  Elevated D-dimer. Sickle cell disease.  CT ANGIOGRAPHY CHEST WITH CONTRAST  Technique:  Multidetector CT imaging of the chest was performed using the standard protocol during bolus administration of intravenous contrast.  Multiplanar CT image reconstructions including MIPs were obtained to evaluate the vascular anatomy.  Contrast: OMNIPAQUE IOHEXOL 300 MG/ML IV SOLN  Comparison:  Prior CT chest  12/12/2009.  Findings:  There is suboptimal opacification of the subsegmental pulmonary arteries.  Given this limitation, there are no filling defects seen in the central or segmental pulmonary branches to suggest pulmonary emboli.  Proximal pulmonary arteries remain enlarged, suggesting early pulmonary artery hypertension.  The heart is also enlarged.  There is no pericardial or pleural effusion. Unremarkable aorta and great vessels.  Note, pulmonary infiltrates or infarcts.  Mild atelectasis left base.  Mild mediastinal adenopathy similar to priors.  No hilar adenopathy or pulmonary masses.  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 Normal appearing kidneys and liver.  Osseous structures demonstrate central endplate depression consistent with infarction.  Coarsening of the vertebral body trabeculae reflects anemia.  Review of the MIP images confirms the above findings.  IMPRESSION: Suboptimal opacification of the distal pulmonary artery radicles, but no central pulmonary embolus is observed.  Early pulmonary arterial hypertension, with cardiomegaly.  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  Original Report Authenticated By: Elsie Stain, M.D.    Assessment/Plan Present on Admission:  30 yo male with ss crisis 1.  Sickle cell crisis iv pain meds, ivf.  Threshold for transfusion is 7-7.5.  Monitor h/h. 2.  acut on chronic pain  Cont po meds plus add iv dilaudid 3.  Pt is dr dean patient.  Can consider calling him in am to transfer care if wish. 4.  Full code.   [name] A 12/16/2010, 8:41 PM

## 2010-12-16 NOTE — ED Notes (Signed)
Away at chest xray

## 2010-12-16 NOTE — ED Provider Notes (Signed)
History    patient with history of sickle cell anemia presenting to the ED today complaining of shortness of breath, chest pain, and dizziness. His states he was getting up this morning and while walking he noticed increased shortness of breath with exertion. He also noticed sensation of dizziness and lightheadedness when he walks.  He also complaining of back pain and the pain consistent with his usual crisis. He has been taking his medication as usual. He does admit to having prior PE in the past and was on anticoagulants. He has not been on anticoagulants for the past 2 months. Patient denies fever, headache, cough, vomiting, diarrhea, abdominal pain, rash, dysuria. He does have some nausea but without vomiting.  CSN: [id number] Arrival date & time: 12/16/2010  3:35 PM   First MD Initiated Contact with Patient 12/16/10 1557      Chief Complaint  Patient presents with  . Sickle Cell Pain Crisis  . Shortness of Breath    (Consider location/radiation/quality/duration/timing/severity/associated sxs/prior treatment) Patient is a 30 y.o. male presenting with sickle cell pain. The history is provided by the patient. No language interpreter was used.  Sickle Cell Pain Crisis  This is a recurrent problem. The current episode started today. The onset was gradual. The problem has been unchanged. The pain is present in the midline region. Site of pain is localized in muscle. The pain is similar to prior episodes. The pain is moderate. Associated symptoms include chest pain, nausea and difficulty breathing. Pertinent negatives include no vomiting, no congestion, no headaches, no sore throat and no cough. There is no swelling present.    Past Medical History  Diagnosis Date  . Sickle cell anemia     Past Surgical History  Procedure Date  . Joint replacement     right hip    No family history on file.  History  Substance Use Topics  . Smoking status: Former Games developer  . Smokeless tobacco: Not  on file  . Alcohol Use: No      Review of Systems  HENT: Negative for congestion and sore throat.   Respiratory: Negative for cough.   Cardiovascular: Positive for chest pain.  Gastrointestinal: Positive for nausea. Negative for vomiting.  Neurological: Negative for headaches.  All other systems reviewed and are negative.    Allergies  Morphine and related  Home Medications   Current Outpatient Rx  Name Route Sig Dispense Refill  . HYDROMORPHONE HCL 4 MG PO TABS Oral Take 4 mg by mouth every 4 (four) hours as needed. For pain     . HYDROXYUREA 500 MG PO CAPS Oral Take 500 mg by mouth 2 (two) times daily. May take with food to minimize GI side effects.    . IBUPROFEN 800 MG PO TABS Oral Take 800 mg by mouth every 8 (eight) hours as needed. pain    . OXYCODONE HCL 40 MG PO TB12 Oral Take 40 mg by mouth every 12 (twelve) hours. pain    . ZOLPIDEM TARTRATE 10 MG PO TABS Oral Take 10 mg by mouth at bedtime as needed. sleep      BP 127/74  Pulse 92  Temp(Src) 98.8 F (37.1 C) (Oral)  Resp 16  Wt 170 lb 9.6 oz (77.384 kg)  SpO2 98%  Physical Exam  Nursing note and vitals reviewed. Constitutional: He appears well-developed and well-nourished.       Awake, alert, nontoxic appearance  HENT:  Head: Atraumatic.  Mouth/Throat: Oropharynx is clear and moist.  Eyes: Conjunctivae  are normal. Right eye exhibits no discharge. Left eye exhibits no discharge. Scleral icterus is present.  Neck: Neck supple.  Cardiovascular: Normal rate and regular rhythm.   Pulmonary/Chest: Effort normal. No respiratory distress. He has no wheezes. He has no rales. He exhibits no tenderness.  Abdominal: Soft. Bowel sounds are normal. There is no tenderness. There is no rebound.  Musculoskeletal: He exhibits no tenderness.       Thoracic back: Normal.       Lumbar back: Normal.       Baseline ROM, no obvious new focal weakness  Neurological:       Mental status and motor strength appears baseline for  patient and situation  Skin: No rash noted.  Psychiatric: He has a normal mood and affect.    ED Course  Procedures (including critical care time)   Labs Reviewed  CBC  DIFFERENTIAL  COMPREHENSIVE METABOLIC PANEL  D-DIMER, QUANTITATIVE  URINALYSIS, ROUTINE W REFLEX MICROSCOPIC  RETICULOCYTES  CARDIAC PANEL(CRET KIN+CKTOT+MB+TROPI)   No results found.   No diagnosis found.  Results for orders placed during the hospital encounter of 12/16/10  CBC      Component Value Range   WBC 25.7 (*) 4.0 - 10.5 (K/uL)   RBC 2.62 (*) 4.22 - 5.81 (MIL/uL)   Hemoglobin 7.9 (*) 13.0 - 17.0 (g/dL)   HCT 30.8 (*) 65.7 - 52.0 (%)   MCV 90.5  78.0 - 100.0 (fL)   MCH 30.2  26.0 - 34.0 (pg)   MCHC 33.3  30.0 - 36.0 (g/dL)   RDW 84.6 (*) 96.2 - 15.5 (%)   Platelets 549 (*) 150 - 400 (K/uL)  DIFFERENTIAL      Component Value Range   Neutrophils Relative 82 (*) 43 - 77 (%)   Lymphocytes Relative 8 (*) 12 - 46 (%)   Monocytes Relative 9  3 - 12 (%)   Eosinophils Relative 0  0 - 5 (%)   Basophils Relative 1  0 - 1 (%)   Neutro Abs 21.0 (*) 1.7 - 7.7 (K/uL)   Lymphs Abs 2.1  0.7 - 4.0 (K/uL)   Monocytes Absolute 2.3 (*) 0.1 - 1.0 (K/uL)   Eosinophils Absolute 0.0  0.0 - 0.7 (K/uL)   Basophils Absolute 0.3 (*) 0.0 - 0.1 (K/uL)   RBC Morphology SICKLE CELLS     WBC Morphology WHITE COUNT CONFIRMED ON SMEAR     Smear Review LARGE PLATELETS PRESENT    COMPREHENSIVE METABOLIC PANEL      Component Value Range   Sodium 138  135 - 145 (mEq/L)   Potassium 3.3 (*) 3.5 - 5.1 (mEq/L)   Chloride 105  96 - 112 (mEq/L)   CO2 20  19 - 32 (mEq/L)   Glucose, Bld 102 (*) 70 - 99 (mg/dL)   BUN 4 (*) 6 - 23 (mg/dL)   Creatinine, Ser 9.52  0.50 - 1.35 (mg/dL)   Calcium 9.4  8.4 - 84.1 (mg/dL)   Total Protein 8.0  6.0 - 8.3 (g/dL)   Albumin 4.5  3.5 - 5.2 (g/dL)   AST 33  0 - 37 (U/L)   ALT 17  0 - 53 (U/L)   Alkaline Phosphatase 79  39 - 117 (U/L)   Total Bilirubin 5.6 (*) 0.3 - 1.2 (mg/dL)   GFR calc  non Af Amer >90  >90 (mL/min)   GFR calc Af Amer >90  >90 (mL/min)  D-DIMER, QUANTITATIVE      Component Value Range   D-Dimer,  Quant 2.39 (*) 0.00 - 0.48 (ug/mL-FEU)  URINALYSIS, ROUTINE W REFLEX MICROSCOPIC      Component Value Range   Color, Urine AMBER (*) YELLOW    Appearance CLEAR  CLEAR    Specific Gravity, Urine 1.012  1.005 - 1.030    pH 7.0  5.0 - 8.0    Glucose, UA NEGATIVE  NEGATIVE (mg/dL)   Hgb urine dipstick NEGATIVE  NEGATIVE    Bilirubin Urine NEGATIVE  NEGATIVE    Ketones, ur NEGATIVE  NEGATIVE (mg/dL)   Protein, ur NEGATIVE  NEGATIVE (mg/dL)   Urobilinogen, UA 1.0  0.0 - 1.0 (mg/dL)   Nitrite NEGATIVE  NEGATIVE    Leukocytes, UA NEGATIVE  NEGATIVE   RETICULOCYTES      Component Value Range   Retic Ct Pct 11.8 (*) 0.4 - 3.1 (%)   RBC. 2.62 (*) 4.22 - 5.81 (MIL/uL)   Retic Count, Manual 309.2 (*) 19.0 - 186.0 (K/uL)  CARDIAC PANEL(CRET KIN+CKTOT+MB+TROPI)      Component Value Range   Total CK 28  7 - 232 (U/L)   CK, MB 0.9  0.3 - 4.0 (ng/mL)   Troponin I <0.30  <0.30 (ng/mL)   Relative Index RELATIVE INDEX IS INVALID  0.0 - 2.5    Dg Chest 2 View  12/16/2010  *RADIOLOGY REPORT*  Clinical Data: Shortness of breath.  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  CHEST - 2 VIEW  Comparison: 11/07/2010  Findings: Chronic cardiomegaly.  Interstitial markings are accentuated with tiny Kerley B lines at the bases suggesting mild interstitial edema.  No effusions.  No osseous abnormality.  IMPRESSION: Mild cardiomegaly with probable slight interstitial edema.  Original Report Authenticated By: Gwynn Burly, M.D.   Ct Angio Chest W/cm &/or Wo Cm  12/16/2010  *RADIOLOGY REPORT*  Clinical Data:  Chest pain today.  Elevated D-dimer. Sickle cell disease.  CT ANGIOGRAPHY CHEST WITH CONTRAST  Technique:  Multidetector CT imaging of the chest was performed using the standard protocol during bolus administration of intravenous contrast.  Multiplanar CT image reconstructions  including MIPs were obtained to evaluate the vascular anatomy.  Contrast: OMNIPAQUE IOHEXOL 300 MG/ML IV SOLN  Comparison:  Prior CT chest 12/12/2009.  Findings:  There is suboptimal opacification of the subsegmental pulmonary arteries.  Given this limitation, there are no filling defects seen in the central or segmental pulmonary branches to suggest pulmonary emboli.  Proximal pulmonary arteries remain enlarged, suggesting early pulmonary artery hypertension.  The heart is also enlarged.  There is no pericardial or pleural effusion. Unremarkable aorta and great vessels.  Note, pulmonary infiltrates or infarcts.  Mild atelectasis left base.  Mild mediastinal adenopathy similar to priors.  No hilar adenopathy or pulmonary masses.  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 Normal appearing kidneys and liver.  Osseous structures demonstrate central endplate depression consistent with infarction.  Coarsening of the vertebral body trabeculae reflects anemia.  Review of the MIP images confirms the above findings.  IMPRESSION: Suboptimal opacification of the distal pulmonary artery radicles, but no central pulmonary embolus is observed.  Early pulmonary arterial hypertension, with cardiomegaly.  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  Original Report Authenticated By: Elsie Stain, M.D.      MDM  Patient with sickle cell disease, presenting with symptoms worrisome for PE. He does have an elevated d-dimer. Therefore, we'll obtain chest CTA for further evaluation. We'll continue IV fluids, pain medication, and oxygen for symptomatic relief.   7:33 PM  Chest CTA shows no evidence of PE. Patient continues to endorse pain despite pain medication he indeed. Patient does not he is able to go home as his pain is not well controlled. Pt will likely need admission. 7:54 PM Patient usually gets his fusion if his hemoglobin is below 7. His hemoglobin today is  7.9. I have consulted with [facility]ad who has agreed to admit the patient.  Patient will be admitted to [facility], telemetry bed.    Fayrene Helper, PA 12/16/10 [phone]

## 2010-12-16 NOTE — ED Provider Notes (Signed)
Medical screening examination/treatment/procedure(s) were conducted as a shared visit with non-physician practitioner(s) and myself.  I personally evaluated the patient during the encounter 73 y male with ss dz c/o bilat flank pain and dizziness that began today. No n/v. + " a little" diarrhea.  No fever, cough, sob, cp.   Says this feels like past ss crisis.  Says he took his meds today before he went to work.    Nicholes Stairs, MD 12/16/10 1640

## 2010-12-17 DIAGNOSIS — G8929 Other chronic pain: Secondary | ICD-10-CM | POA: Diagnosis present

## 2010-12-17 DIAGNOSIS — D72829 Elevated white blood cell count, unspecified: Secondary | ICD-10-CM | POA: Diagnosis present

## 2010-12-17 DIAGNOSIS — E876 Hypokalemia: Secondary | ICD-10-CM | POA: Diagnosis present

## 2010-12-17 LAB — PRO B NATRIURETIC PEPTIDE: Pro B Natriuretic peptide (BNP): 483.8 pg/mL — ABNORMAL HIGH (ref 0–125)

## 2010-12-17 LAB — BASIC METABOLIC PANEL
Calcium: 8.3 mg/dL — ABNORMAL LOW (ref 8.4–10.5)
GFR calc Af Amer: >90 mL/min (ref >90)
GFR calc non Af Amer: >90 mL/min (ref >90)
Glucose, Bld: 92 mg/dL (ref 70–99)
Sodium: 139 mEq/L (ref 135–145)

## 2010-12-17 LAB — PREPARE RBC (CROSSMATCH)

## 2010-12-17 LAB — CBC
Hemoglobin: 6.8 g/dL — CL (ref 13.0–17.0)
MCH: 30.8 pg (ref 26.0–34.0)
MCHC: 34.2 g/dL (ref 30.0–36.0)
Platelets: 441 10*3/uL — ABNORMAL HIGH (ref 150–400)
RDW: 19.1 % — ABNORMAL HIGH (ref 11.5–15.5)

## 2010-12-17 MED ORDER — HYDROMORPHONE HCL PF 2 MG/ML IJ SOLN
INTRAMUSCULAR | Status: AC
  Administered 2010-12-17: 2 mg via INTRAVENOUS
  Filled 2010-12-17: qty 1

## 2010-12-17 MED ORDER — PANTOPRAZOLE SODIUM 40 MG PO TBEC
40.0000 mg | DELAYED_RELEASE_TABLET | Freq: Every day | ORAL | Status: DC
  Administered 2010-12-17 – 2010-12-23 (×7): 40 mg via ORAL
  Filled 2010-12-17 (×6): qty 1

## 2010-12-17 MED ORDER — DEXTROSE 5 % IV SOLN
500.0000 mg | INTRAVENOUS | Status: DC
  Administered 2010-12-17 – 2010-12-19 (×3): 500 mg via INTRAVENOUS
  Filled 2010-12-17 (×5): qty 500

## 2010-12-17 MED ORDER — HYDROMORPHONE HCL 4 MG PO TABS: 4.0000 mg | ORAL_TABLET | ORAL | Status: DC | PRN

## 2010-12-17 MED ORDER — NALOXONE HCL 0.4 MG/ML IJ SOLN
0.4000 mg | INTRAMUSCULAR | Status: DC | PRN
  Administered 2010-12-20: 0.4 mg via INTRAVENOUS
  Filled 2010-12-17: qty 1

## 2010-12-17 MED ORDER — HYDROMORPHONE HCL PF 2 MG/ML IJ SOLN: 2.0000 mg | INTRAMUSCULAR | Status: DC | PRN

## 2010-12-17 MED ORDER — OXYCODONE HCL 40 MG PO TB12
40.0000 mg | ORAL_TABLET | Freq: Two times a day (BID) | ORAL | Status: DC
  Administered 2010-12-17 – 2010-12-23 (×13): 40 mg via ORAL
  Filled 2010-12-17 (×13): qty 1

## 2010-12-17 MED ORDER — DIPHENHYDRAMINE HCL 50 MG/ML IJ SOLN
12.5000 mg | INTRAMUSCULAR | Status: DC | PRN
  Administered 2010-12-17 (×2): 12.5 mg via INTRAVENOUS
  Administered 2010-12-18 (×2): 25 mg via INTRAVENOUS
  Administered 2010-12-18 (×3): 12.5 mg via INTRAVENOUS
  Administered 2010-12-19: 25 mg via INTRAVENOUS
  Administered 2010-12-19: 12.5 mg via INTRAVENOUS
  Administered 2010-12-19 (×3): 25 mg via INTRAVENOUS
  Administered 2010-12-21 (×2): 12.5 mg via INTRAVENOUS
  Filled 2010-12-17 (×17): qty 1

## 2010-12-17 MED ORDER — ONDANSETRON HCL 4 MG PO TABS: 4.0000 mg | ORAL_TABLET | Freq: Four times a day (QID) | ORAL | Status: DC | PRN

## 2010-12-17 MED ORDER — ZOLPIDEM TARTRATE 10 MG PO TABS
10.0000 mg | ORAL_TABLET | Freq: Every evening | ORAL | Status: DC | PRN
  Administered 2010-12-17 – 2010-12-19 (×3): 10 mg via ORAL
  Filled 2010-12-17 (×3): qty 1

## 2010-12-17 MED ORDER — ONDANSETRON HCL 4 MG/2ML IJ SOLN: 4.0000 mg | Freq: Four times a day (QID) | INTRAMUSCULAR | Status: DC | PRN

## 2010-12-17 MED ORDER — HYDROMORPHONE 0.3 MG/ML IV SOLN
INTRAVENOUS | Status: DC
  Administered 2010-12-17 (×2): 7.5 mg via INTRAVENOUS
  Administered 2010-12-17: 0.5 mg via INTRAVENOUS
  Administered 2010-12-18: 4.49 mg via INTRAVENOUS
  Administered 2010-12-18: 7.5 mg via INTRAVENOUS
  Administered 2010-12-18: 6.99 mg via INTRAVENOUS
  Administered 2010-12-18: 7.5 mg via INTRAVENOUS
  Administered 2010-12-18: 2.48 mg via INTRAVENOUS
  Administered 2010-12-18: 4.98 mg via INTRAVENOUS
  Administered 2010-12-18: 4.84 mg via INTRAVENOUS
  Administered 2010-12-19: 7.5 mg via INTRAVENOUS
  Administered 2010-12-19: 5.49 mg via INTRAVENOUS
  Administered 2010-12-19: 7.5 mg via INTRAVENOUS
  Administered 2010-12-19: 2.97 mg via INTRAVENOUS
  Administered 2010-12-19: 7.5 mg via INTRAVENOUS
  Administered 2010-12-19: 3.49 mg via INTRAVENOUS
  Administered 2010-12-19 (×2): 7.5 mg via INTRAVENOUS
  Administered 2010-12-19: 10:00:00 via INTRAVENOUS
  Administered 2010-12-20: 4.99 mg via INTRAVENOUS
  Filled 2010-12-17 (×30): qty 25

## 2010-12-17 MED ORDER — OLANZAPINE 10 MG PO TABS
10.0000 mg | ORAL_TABLET | Freq: Every day | ORAL | Status: DC
  Administered 2010-12-17 – 2010-12-22 (×6): 10 mg via ORAL
  Filled 2010-12-17 (×7): qty 1

## 2010-12-17 MED ORDER — ONDANSETRON HCL 4 MG/2ML IJ SOLN: 4.0000 mg | INTRAMUSCULAR | Status: DC | PRN

## 2010-12-17 MED ORDER — SODIUM CHLORIDE 0.9 % IJ SOLN: 9.0000 mL | INTRAMUSCULAR | Status: DC | PRN

## 2010-12-17 MED ORDER — PIPERACILLIN-TAZOBACTAM 3.375 G IVPB: 3.3750 g | Freq: Four times a day (QID) | INTRAVENOUS | Status: DC

## 2010-12-17 MED ORDER — IBUPROFEN 800 MG PO TABS: 800.0000 mg | ORAL_TABLET | Freq: Three times a day (TID) | ORAL | Status: DC | PRN

## 2010-12-17 MED ORDER — HYDROMORPHONE HCL PF 2 MG/ML IJ SOLN
2.0000 mg | INTRAMUSCULAR | Status: DC | PRN
  Administered 2010-12-17 (×3): 2 mg via INTRAVENOUS
  Filled 2010-12-17 (×3): qty 1

## 2010-12-17 MED ORDER — HYDROXYUREA 500 MG PO CAPS
500.0000 mg | ORAL_CAPSULE | Freq: Two times a day (BID) | ORAL | Status: DC
  Administered 2010-12-17 – 2010-12-23 (×13): 500 mg via ORAL
  Filled 2010-12-17 (×15): qty 1

## 2010-12-17 MED ORDER — ENOXAPARIN SODIUM 40 MG/0.4ML ~~LOC~~ SOLN
40.0000 mg | SUBCUTANEOUS | Status: DC
  Administered 2010-12-17 – 2010-12-23 (×6): 40 mg via SUBCUTANEOUS
  Filled 2010-12-17 (×7): qty 0.4

## 2010-12-17 MED ORDER — PIPERACILLIN-TAZOBACTAM 3.375 G IVPB
3.3750 g | Freq: Three times a day (TID) | INTRAVENOUS | Status: DC
  Administered 2010-12-17 – 2010-12-20 (×9): 3.375 g via INTRAVENOUS
  Filled 2010-12-17 (×12): qty 50

## 2010-12-17 MED ORDER — FOLIC ACID 1 MG PO TABS
1.0000 mg | ORAL_TABLET | Freq: Every day | ORAL | Status: DC
  Administered 2010-12-17 – 2010-12-23 (×6): 1 mg via ORAL
  Filled 2010-12-17 (×7): qty 1

## 2010-12-17 MED ORDER — KCL IN DEXTROSE-NACL 20-5-0.45 MEQ/L-%-% IV SOLN
INTRAVENOUS | Status: DC
  Administered 2010-12-17 – 2010-12-21 (×4): via INTRAVENOUS
  Filled 2010-12-17 (×9): qty 1000

## 2010-12-17 MED ORDER — POTASSIUM CHLORIDE IN NACL 20-0.9 MEQ/L-% IV SOLN
INTRAVENOUS | Status: DC
  Administered 2010-12-17: 02:00:00 via INTRAVENOUS
  Filled 2010-12-17 (×2): qty 1000

## 2010-12-17 MED ORDER — SODIUM CHLORIDE 0.9 % IJ SOLN
10.0000 mL | INTRAMUSCULAR | Status: DC | PRN
  Administered 2010-12-17 – 2010-12-22 (×12): 10 mL

## 2010-12-17 MED ORDER — SENNA 8.6 MG PO TABS: 2.0000 | ORAL_TABLET | Freq: Every day | ORAL | Status: DC | PRN

## 2010-12-17 MED ORDER — DEFEROXAMINE MESYLATE 2 G IJ SOLR
2000.0000 mg | Freq: Once | INTRAMUSCULAR | Status: AC
  Administered 2010-12-17: 2000 mg via INTRAVENOUS
  Filled 2010-12-17: qty 2

## 2010-12-17 MED ORDER — DIPHENHYDRAMINE HCL 12.5 MG/5ML PO ELIX
12.5000 mg | ORAL_SOLUTION | ORAL | Status: DC | PRN
  Administered 2010-12-17: 12.5 mg via ORAL
  Filled 2010-12-17: qty 5

## 2010-12-17 NOTE — Progress Notes (Signed)
Subjective: The patient was seen on rounds today.  The patient was sitting in his bed in visible distress due to pain.  The patient is complaining of pain 7/10 in his bilateral CVA/flank area and in his lower back.  The patient stated he had bilateral LE joint pain last week.  The patient is also complaining of pruritis.  Explained to the patient his plan of care including a blood transfusion, O2 therapy, IV hydration, IS usage and a PCA pump.  The patient was agreeable to this.   No other patient or nursing concerns.   Objective: Vital signs in last 24 hours: Filed Vitals:   12/16/10 2054 12/17/10 0033 12/17/10 0056 12/17/10 0550  BP: 120/76  144/80 114/74  Pulse: 78 68 89 66  Temp:  98.7 F (37.1 C) 98.2 F (36.8 C) 98.5 F (36.9 C)  TempSrc:  Oral Oral Oral  Resp: 18  18 18   Height:   6' (1.829 m)   Weight:   171 lb 8.3 oz (77.8 kg)   SpO2: 98% 98% 98% 97%    Bowel Movement:   Last BM Date: 12/16/10  Physical Exam: GENERAL: alert and moderate distress HEAD: Normocephalic, without obvious abnormality, atraumatic EYES: conjunctivae/corneas clear. PERRL, EOM's intact, scleral icterus THROAT: lips, mucosa, and tongue normal; teeth and gums normal NECK: no adenopathy, supple, symmetrical, trachea midline and thyroid not enlarged, symmetric, no tenderness/mass/nodules RESP: diminished breath sounds bibasilar, CVA tenderness L > R CARDIO: regular rate and rhythm, S1, S2 normal, no murmur, click, rub or gallop GI: soft, non-tender; bowel sounds hypoactive; no masses,  no organomegaly GU: deferred, the patient denies priapism MS: extremities normal, atraumatic, no cyanosis or edema and Homans sign is negative, no sign of DVT, pulses 2+ and symmetric NEURO: Alert and oriented X 3, normal strength and tone, normal coordination and gait Mental status: Alert, oriented, thought content appropriate Cranial nerves: normal Motor: grossly normal PSYCH:  Appropriate affect at times, irritable  affect at other times  Lab Results: D-DIMER, QUANTITATIVE     Status: Abnormal   Collection Time   12/16/10  4:29 PM      Component Value Range   D-Dimer, Quant 2.39 (*) 0.00 - 0.48 (ug/mL-FEU)  RETICULOCYTES     Status: Abnormal   Collection Time   12/16/10  4:29 PM      Component Value Range   Retic Ct Pct 11.8 (*) 0.4 - 3.1 (%)   RBC. 2.62 (*) 4.22 - 5.81 (MIL/uL)   Retic Count, Manual 309.2 (*) 19.0 - 186.0 (K/uL)  CARDIAC PANEL(CRET KIN+CKTOT+MB+TROPI)     Status: Normal   Collection Time   12/16/10  4:29 PM      Component Value Range   Total CK 28  7 - 232 (U/L)   CK, MB 0.9  0.3 - 4.0 (ng/mL)   Troponin I <0.30  <0.30 (ng/mL)   Relative Index RELATIVE INDEX IS INVALID  0.0 - 2.5   URINALYSIS, ROUTINE W REFLEX MICROSCOPIC     Status: Abnormal   Collection Time   12/16/10  7:24 PM      Component Value Range   Color, Urine AMBER (*) YELLOW    Appearance CLEAR  CLEAR    Specific Gravity, Urine 1.012  1.005 - 1.030    pH 7.0  5.0 - 8.0    Glucose, UA NEGATIVE  NEGATIVE (mg/dL)   Hgb urine dipstick NEGATIVE  NEGATIVE    Bilirubin Urine NEGATIVE  NEGATIVE    Ketones, ur  NEGATIVE  NEGATIVE (mg/dL)   Protein, ur NEGATIVE  NEGATIVE (mg/dL)   Urobilinogen, UA 1.0  0.0 - 1.0 (mg/dL)   Nitrite NEGATIVE  NEGATIVE    Leukocytes, UA NEGATIVE  NEGATIVE   BASIC METABOLIC PANEL     Status: Abnormal   Collection Time   12/17/10  5:12 AM      Component Value Range   Sodium 139  135 - 145 (mEq/L)   Potassium 2.9 (*) 3.5 - 5.1 (mEq/L)   Chloride 109  96 - 112 (mEq/L)   CO2 18 (*) 19 - 32 (mEq/L)   Glucose, Bld 92  70 - 99 (mg/dL)   BUN 3 (*) 6 - 23 (mg/dL)   Creatinine, Ser 0.45  0.50 - 1.35 (mg/dL)   Calcium 8.3 (*) 8.4 - 10.5 (mg/dL)   GFR calc non Af Amer >90  >90 (mL/min)   GFR calc Af Amer >90  >90 (mL/min)  MRSA PCR SCREENING     Status: Normal   Collection Time   12/17/10  5:21 AM      Component Value Range   MRSA by PCR NEGATIVE  NEGATIVE      Diagnostic  Studies: Dg Chest 2 View  12/16/2010  *RADIOLOGY REPORT*  Clinical Data: Shortness of breath.  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  CHEST - 2 VIEW  Comparison: 11/07/2010  Findings: Chronic cardiomegaly.  Interstitial markings are accentuated with tiny Kerley B lines at the bases suggesting mild interstitial edema.  No effusions.  No osseous abnormality.  IMPRESSION: Mild cardiomegaly with probable slight interstitial edema.  Original Report Authenticated By: Gwynn Burly, M.D.   Ct Angio Chest W/cm &/or Wo Cm  12/16/2010  *RADIOLOGY REPORT*  Clinical Data:  Chest pain today.  Elevated D-dimer. Sickle cell disease.  CT ANGIOGRAPHY CHEST WITH CONTRAST  Technique:  Multidetector CT imaging of the chest was performed using the standard protocol during bolus administration of intravenous contrast.  Multiplanar CT image reconstructions including MIPs were obtained to evaluate the vascular anatomy.  Contrast: OMNIPAQUE IOHEXOL 300 MG/ML IV SOLN  Comparison:  Prior CT chest 12/12/2009.  Findings:  There is suboptimal opacification of the subsegmental pulmonary arteries.  Given this limitation, there are no filling defects seen in the central or segmental pulmonary branches to suggest pulmonary emboli.  Proximal pulmonary arteries remain enlarged, suggesting early pulmonary artery hypertension.  The heart is also enlarged.  There is no pericardial or pleural effusion. Unremarkable aorta and great vessels.  Note, pulmonary infiltrates or infarcts.  Mild atelectasis left base.  Mild mediastinal adenopathy similar to priors.  No hilar adenopathy or pulmonary masses.  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 Normal appearing kidneys and liver.  Osseous structures demonstrate central endplate depression consistent with infarction.  Coarsening of the vertebral body trabeculae reflects anemia.  Review of the MIP images confirms the above findings.  IMPRESSION: Suboptimal opacification of  the distal pulmonary artery radicles, but no central pulmonary embolus is observed.  Early pulmonary arterial hypertension, with cardiomegaly.  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  Original Report Authenticated By: Elsie Stain, M.D.    Allergies  Allergen Reactions  . Morphine And Related Rash     Medications: I have reviewed the patient's current medications.  Scheduled Meds Current Facility-Administered Medications  Medication Dose Route Frequency Provider Last Rate Last Dose  . azithromycin (ZITHROMAX) 500 mg in dextrose 5 % 250 mL IVPB  500 mg Intravenous Q24H Larina Bras,  NP      . deferoxamine (DESFERAL) 2,000 mg in dextrose 5 % 500 mL infusion  2,000 mg Intravenous Once Larina Bras, NP      . dextrose 5 % and 0.45 % NaCl with KCl 20 mEq/L infusion   Intravenous Continuous Larina Bras, NP      . diphenhydrAMINE (BENADRYL) injection 12.5 mg  12.5 mg Intravenous Q4H PRN Larina Bras, NP       Or  . diphenhydrAMINE (BENADRYL) 12.5 MG/5ML elixir 12.5 mg  12.5 mg Oral Q4H PRN Larina Bras, NP      . enoxaparin (LOVENOX) injection 40 mg  40 mg Subcutaneous Q24H Rachal A David   40 mg at 12/17/10 0846  . folic acid (FOLVITE) tablet 1 mg  1 mg Oral Daily Larina Bras, NP      . HYDROmorphone (DILAUDID) injection 2 mg  2 mg Intravenous Q3H PRN Larina Bras, NP      . HYDROmorphone (DILAUDID) PCA injection 0.3 mg/mL   Intravenous Q4H Larina Bras, NP      . hydroxyurea (HYDREA) capsule 500 mg  500 mg Oral BID Rachal A David   500 mg at 12/17/10 0152  . ibuprofen (ADVIL,MOTRIN) tablet 800 mg  800 mg Oral Q8H PRN Rachal A David      . iohexol (OMNIPAQUE) 300 MG/ML injection 100 mL  100 mL Intravenous Once PRN Medication Radiologist   100 mL at 12/16/10 1900  . naloxone [facility]) injection 0.4 mg  0.4 mg Intravenous PRN Larina Bras, NP       And  . sodium chloride 0.9 % injection 9 mL  9 mL Intravenous PRN  Larina Bras, NP      . ondansetron (ZOFRAN) injection 4 mg  4 mg Intravenous Q4H PRN Larina Bras, NP       Or  . ondansetron (ZOFRAN) tablet 4 mg  4 mg Oral Q6H PRN Larina Bras, NP      . oxyCODONE (OXYCONTIN) 12 hr tablet 40 mg  40 mg Oral BID Rachal A David   40 mg at 12/17/10 0152  . pantoprazole (PROTONIX) EC tablet 40 mg  40 mg Oral Q1200 Larina Bras, NP      . piperacillin-tazobactam (ZOSYN) IVPB 3.375 g  3.375 g Intravenous Q8H Larina Bras, NP      . senna (SENOKOT) tablet 17.2 mg  2 tablet Oral Daily PRN Rachal A David      . zolpidem (AMBIEN) tablet 10 mg  10 mg Oral QHS PRN Rachal A David          Assessment/Plan: Patient Active Problem List  Diagnoses  . Sickle cell anemia  . Leukocytosis  . Chronic pain  . Hypokalemia    The patient will be giving a transfusion of 1 unit of PRBC's today.  The patient will also receive a dose of Desferal after his transfusion.   The is receiving pain management, IV hydration, IV antibiotics, medication for pruritis, insomnia, nausea, potassium repletion, in addition to  GI and DVT prophylaxis. Discussed and agreed upon plan of care with the patient.  Will adjust plan of care based on the patient's clinical progress.   Larina Bras, NP-C 12/17/2010, 9:29 AM

## 2010-12-17 NOTE — Plan of Care (Signed)
Problem: Phase I Progression Outcomes Goal: Initial discharge plan identified Outcome: Completed/Met Date Met:  12/17/10 Plan to go Home

## 2010-12-17 NOTE — Progress Notes (Signed)
ANTIBIOTIC CONSULT NOTE - INITIAL  Pharmacy Consult for Zosyn Indication: Leukocytosis/flank pain  Allergies  Allergen Reactions  . Morphine And Related Rash   Patient Measurements: Height: 6' (182.9 cm) Weight: 171 lb 8.3 oz (77.8 kg) IBW/kg (Calculated) : 77.6   Vital Signs: Temp: 98.5 F (36.9 C) (11/16 1430) Temp src: Oral (11/16 1430) BP: 133/73 mmHg (11/16 1430) Pulse Rate: 87  (11/16 1430) Intake/Output from previous day: 11/15 0701 - 11/16 0700 In: 1908.3 [P.O.:240; I.V.:468.3; IV Piggyback:1200] Out: -  Intake/Output from this shift: Total I/O In: 480 [P.O.:480] Out: -   Labs:  Basename 12/17/10 0512 12/16/10 1629  WBC 20.0* 25.7*  HGB 6.8* 7.9*  PLT 441* 549*  LABCREA -- --  CREATININE 0.56 0.57   Estimated Creatinine Clearance: 146.8 ml/min (by C-G formula based on Cr of 0.56). No results found for this basename: VANCOTROUGH:2,VANCOPEAK:2,VANCORANDOM:2,GENTTROUGH:2,GENTPEAK:2,GENTRANDOM:2,TOBRATROUGH:2,TOBRAPEAK:2,TOBRARND:2,AMIKACINPEAK:2,AMIKACINTROU:2,AMIKACIN:2, in the last 72 hours   Microbiology: Recent Results (from the past 720 hour(s))  MRSA PCR SCREENING     Status: Normal   Collection Time   12/17/10  5:21 AM      Component Value Range Status Comment   MRSA by PCR NEGATIVE  NEGATIVE  Final     Medical History: Past Medical History  Diagnosis Date  . Sickle cell anemia     Medications:  Scheduled:    . azithromycin  500 mg Intravenous Q24H  . deferoxamine (DESFERAL) IV  2,000 mg Intravenous Once  . enoxaparin  40 mg Subcutaneous Q24H  . folic acid  1 mg Oral Daily  .  HYDROmorphone (DILAUDID) injection  2 mg Intravenous Once  .  HYDROmorphone (DILAUDID) injection  2 mg Intravenous Once  .  HYDROmorphone (DILAUDID) injection  2 mg Intravenous Once  . HYDROmorphone PCA 0.3 mg/mL   Intravenous Q4H  . hydroxyurea  500 mg Oral BID  . OLANZapine  10 mg Oral QHS  . ondansetron  4 mg Intravenous Once  . oxyCODONE  40 mg Oral BID  .  pantoprazole  40 mg Oral Q1200  . piperacillin-tazobactam (ZOSYN)  IV  3.375 g Intravenous Q8H  . sodium chloride  1,000 mL Intravenous Once  . sodium chloride  1,000 mL Intravenous Once  . DISCONTD: piperacillin-tazobactam (ZOSYN)  IV  3.375 g Intravenous Q6H  . DISCONTD: sodium chloride  1,000 mL Intravenous Once   Assessment: Adding Zosyn to Azithromycin empirically in Sickle-cell patient with leukoytosis and pain crisis. Good renal function.  Goal of Therapy:  Eradication of infection.  Plan:  Zosyn 3.375g IV Q8H infused over 4hrs. Follow up culture results.  Reece Packer 12/17/2010,3:42 PM

## 2010-12-17 NOTE — Progress Notes (Signed)
Chart reviewed and UR completed. 

## 2010-12-17 NOTE — Progress Notes (Signed)
CRITICAL VALUE ALERT  Critical value received:  Hgb - 6.8  Date of notification:  12/17/10  Time of notification:  0548  Critical value read back:yes  Nurse who received alert:  Adelfa Koh  MD notified (1st page):  Josephine Igo  Time of first page:  0550  MD notified (2nd page):  Time of second page:  Responding MD:  Josephine Igo  Time MD responded:  [phone]  No new orders received

## 2010-12-18 LAB — COMPREHENSIVE METABOLIC PANEL
ALT: 20 U/L (ref 0–53)
Alkaline Phosphatase: 83 U/L (ref 39–117)
Chloride: 107 mEq/L (ref 96–112)
GFR calc Af Amer: >90 mL/min (ref >90)
Glucose, Bld: 99 mg/dL (ref 70–99)
Potassium: 3.1 mEq/L — ABNORMAL LOW (ref 3.5–5.1)
Sodium: 138 mEq/L (ref 135–145)
Total Protein: 7 g/dL (ref 6.0–8.3)

## 2010-12-18 LAB — TYPE AND SCREEN: Unit division: 0

## 2010-12-18 LAB — CBC
HCT: 21.4 % — ABNORMAL LOW (ref 39.0–52.0)
MCHC: 35 g/dL (ref 30.0–36.0)
MCV: 90.3 fL (ref 78.0–100.0)
RDW: 19 % — ABNORMAL HIGH (ref 11.5–15.5)

## 2010-12-18 MED ORDER — POTASSIUM CHLORIDE CRYS ER 20 MEQ PO TBCR
20.0000 meq | EXTENDED_RELEASE_TABLET | Freq: Two times a day (BID) | ORAL | Status: DC
  Administered 2010-12-18 – 2010-12-21 (×7): 20 meq via ORAL
  Filled 2010-12-18 (×8): qty 1

## 2010-12-18 NOTE — Progress Notes (Signed)
Subjective: Feeling little better. Messing with IV pump per nurse. No distress.   Objective: Vital signs in last 24 hours: Filed Vitals:   12/18/10 0555 12/18/10 0800 12/18/10 1136 12/18/10 1139  BP:      Pulse:      Temp:      TempSrc:      Resp: 18 20 18 20   Height:      Weight:      SpO2: 98% 98%  100%    Bowel Movement:   Last BM Date: 12/17/10  Physical Exam: GENERAL: alert and no distress HEAD: Normocephalic, atraumatic EYES: conjunctivae/corneas clear. PERRL, EOM's intact, positive scleral icterus THROAT: lips, mucosa, and tongue pale pink. Teeth and gums normal NECK: no adenopathy, supple, symmetrical, trachea midline and thyroid not enlarged, symmetric, no tenderness/mass/nodules RESP: Clear, bilaterally. CARDIO: regular rate and rhythm, S1, S2 normal, II/VI systolic murmur right and left sternal border. GI: soft, non-tender; bowel sounds hypoactive;  MS: extremities normal, atraumatic, no cyanosis or edema and Homans sign is negative, no sign of DVT, pulses 2+ and symmetric NEURO: Alert and oriented X 3, normal strength and tone, normal coordination and gait Mental status: Alert, oriented, thought content appropriate Cranial nerves: normal Motor: grossly normal PSYCH:  Appropriate affect. At times, irritable.   BASIC METABOLIC PANEL     Status: Abnormal   Collection Time   12/17/10  5:12 AM      Component Value Range   Sodium 139  135 - 145 (mEq/L)   Potassium 2.9 (*) 3.5 - 5.1 (mEq/L)   Chloride 109  96 - 112 (mEq/L)   CO2 18 (*) 19 - 32 (mEq/L)   Glucose, Bld 92  70 - 99 (mg/dL)   BUN 3 (*) 6 - 23 (mg/dL)   Creatinine, Ser 0.45  0.50 - 1.35 (mg/dL)   Calcium 8.3 (*) 8.4 - 10.5 (mg/dL)   GFR calc non Af Amer >90  >90 (mL/min)   GFR calc Af Amer >90  >90 (mL/min)  MRSA PCR SCREENING     Status: Normal   Collection Time   12/17/10  5:21 AM      Component Value Range   MRSA by PCR NEGATIVE  NEGATIVE      Diagnostic Studies: Dg Chest 2  View  12/16/2010  *RADIOLOGY REPORT*  Clinical Data: Shortness of breath.  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  CHEST - 2 VIEW  Comparison: 11/07/2010  Findings: Chronic cardiomegaly.  Interstitial markings are accentuated with tiny Kerley B lines at the bases suggesting mild interstitial edema.  No effusions.  No osseous abnormality.  IMPRESSION: Mild cardiomegaly with probable slight interstitial edema.  Original Report Authenticated By: Gwynn Burly, M.D.   Ct Angio Chest W/cm &/or Wo Cm  12/16/2010  *RADIOLOGY REPORT*  Clinical Data:  Chest pain today.  Elevated D-dimer. Sickle cell disease.  CT ANGIOGRAPHY CHEST WITH CONTRAST  Technique:  Multidetector CT imaging of the chest was performed using the standard protocol during bolus administration of intravenous contrast.  Multiplanar CT image reconstructions including MIPs were obtained to evaluate the vascular anatomy.  Contrast: OMNIPAQUE IOHEXOL 300 MG/ML IV SOLN  Comparison:  Prior CT chest 12/12/2009.  Findings:  There is suboptimal opacification of the subsegmental pulmonary arteries.  Given this limitation, there are no filling defects seen in the central or segmental pulmonary branches to suggest pulmonary emboli.  Proximal pulmonary arteries remain enlarged, suggesting early pulmonary artery hypertension.  The heart is also enlarged.  There is no  pericardial or pleural effusion. Unremarkable aorta and great vessels.  Note, pulmonary infiltrates or infarcts.  Mild atelectasis left base.  Mild mediastinal adenopathy similar to priors.  No hilar adenopathy or pulmonary masses.  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 Normal appearing kidneys and liver.  Osseous structures demonstrate central endplate depression consistent with infarction.  Coarsening of the vertebral body trabeculae reflects anemia.  Review of the MIP images confirms the above findings.  IMPRESSION: Suboptimal opacification of the distal pulmonary  artery radicles, but no central pulmonary embolus is observed.  Early pulmonary arterial hypertension, with cardiomegaly.  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  Original Report Authenticated By: Elsie Stain, M.D.    Allergies  Allergen Reactions  . Morphine And Related Rash     Medications: I have reviewed the patient's current medications.  Scheduled Meds Current Facility-Administered Medications  Medication Dose Route Frequency Provider Last Rate Last Dose  . azithromycin (ZITHROMAX) 500 mg in dextrose 5 % 250 mL IVPB  500 mg Intravenous Q24H Larina Bras, NP      . deferoxamine (DESFERAL) 2,000 mg in dextrose 5 % 500 mL infusion  2,000 mg Intravenous Once Larina Bras, NP      . dextrose 5 % and 0.45 % NaCl with KCl 20 mEq/L infusion   Intravenous Continuous Larina Bras, NP      . diphenhydrAMINE (BENADRYL) injection 12.5 mg  12.5 mg Intravenous Q4H PRN Larina Bras, NP       Or  . diphenhydrAMINE (BENADRYL) 12.5 MG/5ML elixir 12.5 mg  12.5 mg Oral Q4H PRN Larina Bras, NP      . enoxaparin (LOVENOX) injection 40 mg  40 mg Subcutaneous Q24H Rachal A David   40 mg at 12/17/10 0846  . folic acid (FOLVITE) tablet 1 mg  1 mg Oral Daily Larina Bras, NP      . HYDROmorphone (DILAUDID) injection 2 mg  2 mg Intravenous Q3H PRN Larina Bras, NP      . HYDROmorphone (DILAUDID) PCA injection 0.3 mg/mL   Intravenous Q4H Larina Bras, NP      . hydroxyurea (HYDREA) capsule 500 mg  500 mg Oral BID Rachal A David   500 mg at 12/17/10 0152  . ibuprofen (ADVIL,MOTRIN) tablet 800 mg  800 mg Oral Q8H PRN Rachal A David      . iohexol (OMNIPAQUE) 300 MG/ML injection 100 mL  100 mL Intravenous Once PRN Medication Radiologist   100 mL at 12/16/10 1900  . naloxone [facility]) injection 0.4 mg  0.4 mg Intravenous PRN Larina Bras, NP       And  . sodium chloride 0.9 % injection 9 mL  9 mL Intravenous PRN Larina Bras, NP       . ondansetron (ZOFRAN) injection 4 mg  4 mg Intravenous Q4H PRN Larina Bras, NP       Or  . ondansetron (ZOFRAN) tablet 4 mg  4 mg Oral Q6H PRN Larina Bras, NP      . oxyCODONE (OXYCONTIN) 12 hr tablet 40 mg  40 mg Oral BID Rachal A David   40 mg at 12/17/10 0152  . pantoprazole (PROTONIX) EC tablet 40 mg  40 mg Oral Q1200 Larina Bras, NP      . piperacillin-tazobactam (ZOSYN) IVPB 3.375 g  3.375 g Intravenous Q8H Larina Bras, NP      . senna (SENOKOT) tablet 17.2 mg  2 tablet Oral Daily PRN Rachal A David      .  zolpidem (AMBIEN) tablet 10 mg  10 mg Oral QHS PRN Rachal A David          Assessment/Plan: Patient Active Problem List  Diagnoses  . Sickle cell anemia  . Leukocytosis  . Chronic pain  . Hypokalemia    The patient had a transfusion of 1 unit of packed RBC's.  The patient will also received a dose of Desferal after his transfusion.   Continue pain management, IV hydration, IV antibiotics, medication for pruritis, insomnia, nausea, potassium repletion, in addition to  GI and DVT prophylaxis. Discussed and agreed upon plan of care with the patient.  Will adjust plan of care based on the patient's clinical progress.   Ricki Rodriguez, MD 12/18/2010, 12:52 PM

## 2010-12-19 LAB — COMPREHENSIVE METABOLIC PANEL
CO2: 21 mEq/L (ref 19–32)
Calcium: 9.1 mg/dL (ref 8.4–10.5)
Creatinine, Ser: 0.63 mg/dL (ref 0.50–1.35)
GFR calc Af Amer: >90 mL/min (ref >90)
GFR calc non Af Amer: >90 mL/min (ref >90)
Glucose, Bld: 103 mg/dL — ABNORMAL HIGH (ref 70–99)

## 2010-12-19 LAB — CBC
Hemoglobin: 7.8 g/dL — ABNORMAL LOW (ref 13.0–17.0)
RBC: 2.5 MIL/uL — ABNORMAL LOW (ref 4.22–5.81)
WBC: 20.1 10*3/uL — ABNORMAL HIGH (ref 4.0–10.5)

## 2010-12-19 NOTE — Progress Notes (Signed)
Subjective: Feeling little better. Asking for more benadryl.  Objective: Vital signs in last 24 hours: Filed Vitals:   12/19/10 0624 12/19/10 0627 12/19/10 0933 12/19/10 1200  BP: 132/90 131/90    Pulse: 96 94    Temp:      TempSrc:      Resp:   18 20  Height:      Weight:      SpO2:   98% 98%    Bowel Movement:   Last BM Date: 12/17/10  Physical Exam: GENERAL: alert and no distress HEAD: Normocephalic, atraumatic EYES: conjunctivae/corneas clear. PERRL, EOM's intact, positive scleral icterus THROAT: lips, mucosa, and tongue pale pink.  NECK: no adenopathy, supple, symmetrical, trachea midline and thyroid not enlarged, symmetric. RESP: Clear, bilaterally. CARDIO: regular rate and rhythm, S1, S2 normal, II/VI systolic murmur right and left sternal border. GI: soft, non-tender; bowel sounds hypoactive;  MS: extremities normal, atraumatic, no cyanosis or edema and Homans sign is negative, no sign of DVT, pulses 2+ and symmetric NEURO: Alert and oriented X 3, normal strength and tone, thought content appropriate Cranial nerves: Grossly normal PSYCH:  Appropriate affect. At times, irritable.      Diagnostic Studies: Dg Chest 2 View  12/16/2010  *RADIOLOGY REPORT*  Clinical Data: Shortness of breath.  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  CHEST - 2 VIEW  Comparison: 11/07/2010  Findings: Chronic cardiomegaly.  Interstitial markings are accentuated with tiny Kerley B lines at the bases suggesting mild interstitial edema.  No effusions.  No osseous abnormality.  IMPRESSION: Mild cardiomegaly with probable slight interstitial edema.  Original Report Authenticated By: Gwynn Burly, M.D.   Ct Angio Chest W/cm &/or Wo Cm  12/16/2010  *RADIOLOGY REPORT*  Clinical Data:  Chest pain today.  Elevated D-dimer. Sickle cell disease.  CT ANGIOGRAPHY CHEST WITH CONTRAST  Technique:  Multidetector CT imaging of the chest was performed using the standard protocol during bolus  administration of intravenous contrast.  Multiplanar CT image reconstructions including MIPs were obtained to evaluate the vascular anatomy.  Contrast: OMNIPAQUE IOHEXOL 300 MG/ML IV SOLN  Comparison:  Prior CT chest 12/12/2009.  Findings:  There is suboptimal opacification of the subsegmental pulmonary arteries.  Given this limitation, there are no filling defects seen in the central or segmental pulmonary branches to suggest pulmonary emboli.  Proximal pulmonary arteries remain enlarged, suggesting early pulmonary artery hypertension.  The heart is also enlarged.  There is no pericardial or pleural effusion. Unremarkable aorta and great vessels.  Note, pulmonary infiltrates or infarcts.  Mild atelectasis left base.  Mild mediastinal adenopathy similar to priors.  No hilar adenopathy or pulmonary masses.  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 Normal appearing kidneys and liver.  Osseous structures demonstrate central endplate depression consistent with infarction.  Coarsening of the vertebral body trabeculae reflects anemia.  Review of the MIP images confirms the above findings.  IMPRESSION: Suboptimal opacification of the distal pulmonary artery radicles, but no central pulmonary embolus is observed.  Early pulmonary arterial hypertension, with cardiomegaly.  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  Original Report Authenticated By: Elsie Stain, M.D.    Allergies  Allergen Reactions  . Morphine And Related Rash     Medications: I have reviewed the patient's current medications.  Scheduled Meds Current Facility-Administered Medications  Medication Dose Route Frequency Provider Last Rate Last Dose  . azithromycin (ZITHROMAX) 500 mg in dextrose 5 % 250 mL IVPB  500 mg Intravenous Q24H Adela Lank  Hunter, NP      . deferoxamine (DESFERAL) 2,000 mg in dextrose 5 % 500 mL infusion  2,000 mg Intravenous Once Larina Bras, NP      .  dextrose 5 % and 0.45 % NaCl with KCl 20 mEq/L infusion   Intravenous Continuous Larina Bras, NP      . diphenhydrAMINE (BENADRYL) injection 12.5 mg  12.5 mg Intravenous Q4H PRN Larina Bras, NP       Or  . diphenhydrAMINE (BENADRYL) 12.5 MG/5ML elixir 12.5 mg  12.5 mg Oral Q4H PRN Larina Bras, NP      . enoxaparin (LOVENOX) injection 40 mg  40 mg Subcutaneous Q24H Rachal A David   40 mg at 12/17/10 0846  . folic acid (FOLVITE) tablet 1 mg  1 mg Oral Daily Larina Bras, NP      . HYDROmorphone (DILAUDID) injection 2 mg  2 mg Intravenous Q3H PRN Larina Bras, NP      . HYDROmorphone (DILAUDID) PCA injection 0.3 mg/mL   Intravenous Q4H Larina Bras, NP      . hydroxyurea (HYDREA) capsule 500 mg  500 mg Oral BID Rachal A David   500 mg at 12/17/10 0152  . ibuprofen (ADVIL,MOTRIN) tablet 800 mg  800 mg Oral Q8H PRN Rachal A David      . iohexol (OMNIPAQUE) 300 MG/ML injection 100 mL  100 mL Intravenous Once PRN Medication Radiologist   100 mL at 12/16/10 1900  . naloxone [facility]) injection 0.4 mg  0.4 mg Intravenous PRN Larina Bras, NP       And  . sodium chloride 0.9 % injection 9 mL  9 mL Intravenous PRN Larina Bras, NP      . ondansetron (ZOFRAN) injection 4 mg  4 mg Intravenous Q4H PRN Larina Bras, NP       Or  . ondansetron (ZOFRAN) tablet 4 mg  4 mg Oral Q6H PRN Larina Bras, NP      . oxyCODONE (OXYCONTIN) 12 hr tablet 40 mg  40 mg Oral BID Rachal A David   40 mg at 12/17/10 0152  . pantoprazole (PROTONIX) EC tablet 40 mg  40 mg Oral Q1200 Larina Bras, NP      . piperacillin-tazobactam (ZOSYN) IVPB 3.375 g  3.375 g Intravenous Q8H Larina Bras, NP      . senna (SENOKOT) tablet 17.2 mg  2 tablet Oral Daily PRN Rachal A David      . zolpidem (AMBIEN) tablet 10 mg  10 mg Oral QHS PRN Rachal A David          Assessment/Plan: Patient Active Problem List  Diagnoses  . Sickle cell anemia  . Leukocytosis  . Chronic pain  .  Hypokalemia    The patient had a transfusion of 1 unit of packed RBC's.  The patient will also received a dose of Desferal after his transfusion.   Continue pain management, IV hydration, IV antibiotics, medication for pruritis, insomnia, nausea, potassium repletion, in addition to  GI and DVT prophylaxis. Discussed and agreed upon plan of care with the patient.  Will adjust plan of care based on the patient's clinical progress.   Ricki Rodriguez, MD 12/19/2010, 2:16 PM

## 2010-12-20 LAB — COMPREHENSIVE METABOLIC PANEL
ALT: 24 U/L (ref 0–53)
Alkaline Phosphatase: 93 U/L (ref 39–117)
CO2: 22 mEq/L (ref 19–32)
GFR calc Af Amer: >90 mL/min (ref >90)
GFR calc non Af Amer: >90 mL/min (ref >90)
Glucose, Bld: 92 mg/dL (ref 70–99)
Potassium: 3.7 mEq/L (ref 3.5–5.1)
Sodium: 139 mEq/L (ref 135–145)
Total Protein: 7.8 g/dL (ref 6.0–8.3)

## 2010-12-20 LAB — CBC
HCT: 22.3 % — ABNORMAL LOW (ref 39.0–52.0)
MCH: 30.1 pg (ref 26.0–34.0)
MCHC: 33.2 g/dL (ref 30.0–36.0)
RDW: 18.6 % — ABNORMAL HIGH (ref 11.5–15.5)

## 2010-12-20 MED ORDER — MAGNESIUM OXIDE 400 MG PO TABS
400.0000 mg | ORAL_TABLET | Freq: Two times a day (BID) | ORAL | Status: AC
  Administered 2010-12-20 (×2): 400 mg via ORAL
  Filled 2010-12-20 (×2): qty 1

## 2010-12-20 MED ORDER — HYDROMORPHONE HCL PF 2 MG/ML IJ SOLN
2.0000 mg | INTRAMUSCULAR | Status: DC | PRN
  Administered 2010-12-20 (×3): 2 mg via INTRAVENOUS
  Filled 2010-12-20 (×3): qty 1

## 2010-12-20 MED ORDER — LEVALBUTEROL HCL 0.63 MG/3ML IN NEBU
0.6300 mg | INHALATION_SOLUTION | Freq: Four times a day (QID) | RESPIRATORY_TRACT | Status: DC | PRN
  Filled 2010-12-20: qty 3

## 2010-12-20 MED ORDER — DIPHENHYDRAMINE HCL 50 MG/ML IJ SOLN
12.5000 mg | Freq: Four times a day (QID) | INTRAMUSCULAR | Status: DC | PRN
  Administered 2010-12-20: 12.5 mg via INTRAVENOUS
  Filled 2010-12-20: qty 1

## 2010-12-20 MED ORDER — ONDANSETRON HCL 4 MG/2ML IJ SOLN: 4.0000 mg | Freq: Four times a day (QID) | INTRAMUSCULAR | Status: DC | PRN

## 2010-12-20 MED ORDER — HYDROMORPHONE 0.3 MG/ML IV SOLN
INTRAVENOUS | Status: DC
  Administered 2010-12-20 – 2010-12-21 (×2): 7.5 mg via INTRAVENOUS
  Administered 2010-12-21: 5.19 mg via INTRAVENOUS
  Administered 2010-12-21: 2.89 mg via INTRAVENOUS
  Administered 2010-12-21: 2.8 mg via INTRAVENOUS
  Administered 2010-12-21: 5.19 mg via INTRAVENOUS
  Administered 2010-12-21: 3.1 mg via INTRAVENOUS
  Administered 2010-12-21 – 2010-12-22 (×3): 7.5 mg via INTRAVENOUS
  Administered 2010-12-22: 4.29 mg via INTRAVENOUS
  Administered 2010-12-22: 5.93 mg via INTRAVENOUS
  Administered 2010-12-22: 0.4 mg via INTRAVENOUS
  Administered 2010-12-22: 6.31 mg via INTRAVENOUS
  Administered 2010-12-23: 5.99 mg via INTRAVENOUS
  Administered 2010-12-23: 01:00:00 via INTRAVENOUS
  Administered 2010-12-23: 2.79 mg via INTRAVENOUS
  Administered 2010-12-23: 2 mg via INTRAVENOUS
  Filled 2010-12-20 (×10): qty 25

## 2010-12-20 MED ORDER — IBUPROFEN 800 MG PO TABS
800.0000 mg | ORAL_TABLET | Freq: Three times a day (TID) | ORAL | Status: DC
  Administered 2010-12-20 – 2010-12-23 (×8): 800 mg via ORAL
  Filled 2010-12-20 (×10): qty 1

## 2010-12-20 MED ORDER — NALOXONE HCL 0.4 MG/ML IJ SOLN: 0.4000 mg | INTRAMUSCULAR | Status: DC | PRN

## 2010-12-20 MED ORDER — SODIUM CHLORIDE 0.9 % IJ SOLN: 9.0000 mL | INTRAMUSCULAR | Status: DC | PRN

## 2010-12-20 MED ORDER — DIPHENHYDRAMINE HCL 12.5 MG/5ML PO ELIX: 12.5000 mg | ORAL_SOLUTION | Freq: Four times a day (QID) | ORAL | Status: DC | PRN

## 2010-12-20 MED ORDER — MOXIFLOXACIN HCL IN NACL 400 MG/250ML IV SOLN
400.0000 mg | INTRAVENOUS | Status: DC
  Administered 2010-12-20 – 2010-12-22 (×3): 400 mg via INTRAVENOUS
  Filled 2010-12-20 (×4): qty 250

## 2010-12-20 NOTE — ED Provider Notes (Signed)
Medical screening examination/treatment/procedure(s) were conducted as a shared visit with non-physician practitioner(s) and myself.  I personally evaluated the patient during the encounter  Nicholes Stairs, MD 12/20/10 1129

## 2010-12-20 NOTE — Progress Notes (Signed)
Narcan given and PCA dc'd per MD order. See MAR. Maeola Harman

## 2010-12-20 NOTE — Progress Notes (Signed)
Pt up walking in room and had disconnect IV pump and IV lines.  Pt appeared to be disoriented.  When questioned pt about where he was, pt stated "[facility]".  Assisted pt back to bed and re-oriented him to room and equipment.  IV team RN called to assess PICC line.  Will continue to monitor/assess pt. Maeola Harman

## 2010-12-20 NOTE — Progress Notes (Signed)
Pt asleep at this time.  No c/o pain or discomfort. [name]

## 2010-12-20 NOTE — Progress Notes (Signed)
Pt up ambulating in hallway and began entering other patients rooms due to his disorientation of his room location.  Staff RN informed pt that he needed to return to his room at which time the pt became verbally loud with staff who were trying to assist him back to his room.  Hospital Administrative Coordinator and security notified of event and arrived to patient's bedside.  Neurosurgeon discussed with patient the rationale for asking him to return to his room to keep him (the pt) as well as other patients safe.  Pt continued to become loud and verbally aggressive and use profanity in speaking with Neurosurgeon and nursing staff. Upon RN assessment pt appeared sedated with the appearance of "glazed-over" eyes.    Dr. Algie Coffer (on call MD for Dr. [name]) notified and orders were received to give the patient Narcan and dc PCA until an assessment is made by Dr. [name] upon today's rounds.  Maeola Harman

## 2010-12-20 NOTE — Progress Notes (Signed)
Subjective: The patient was seen on rounds today.  The patient was laying in his bed resting quietly, but was easily aroused.  The patient is complaining of pain 6/10 in his bilateral CVA/flank, in his lower back and mild pain in bilateral LE (R > L).   The patient stated that his PCA was D/C by the Attending Physician yesterday because his nurse stated that he was walking into other patients' rooms.  The patient says he has been getting oral pain medications, but this is not affecting his pain.  Encouraged the patient to use an IS.  No other patient or nursing concerns.   Objective: Vital signs in last 24 hours: Filed Vitals:   12/20/10 0402 12/20/10 0534 12/20/10 0536 12/20/10 0537  BP:  133/77 118/70 120/80  Pulse:  89 99 110  Temp:  99.2 F (37.3 C)    TempSrc:  Oral    Resp: 18 18    Height:      Weight:      SpO2: 99% 97%      Bowel Movement:   Last BM Date: 12/17/10  Physical Exam: GENERAL: alert, no visible distress, well developed, well nourished.   HEAD: Normocephalic, without obvious abnormality, atraumatic EYES: conjunctivae/corneas clear. PERRL, EOM's intact, scleral icterus THROAT: lips, mucosa, and tongue normal; teeth and gums normal NECK: no adenopathy, supple, symmetrical, trachea midline and thyroid not enlarged, symmetric, no tenderness/mass/nodules RESP: diminished breath sounds bibasilar, CVA tenderness L > R CARDIO: regular rate and rhythm, S1, S2 normal, no murmur, click, rub or gallop GI: soft, non-tender; bowel sounds hypoactive; no masses,  no organomegaly GU: deferred, the patient denies priapism MS: extremities normal, atraumatic, no cyanosis or edema and Homans sign is negative, no sign of DVT, pulses 2+ and symmetric NEURO: Alert and oriented X 3, normal strength and tone, normal coordination and gait Mental status: Alert, oriented, thought content appropriate Cranial nerves: normal Motor: grossly normal PSYCH:  Appropriate affect at times, irritable  affect at other times  Lab Results: Results for orders placed during the hospital encounter of 12/16/10 (from the past 24 hour(s))  COMPREHENSIVE METABOLIC PANEL     Status: Abnormal   Collection Time   12/20/10  4:00 AM      Component Value Range   Sodium 139  135 - 145 (mEq/L)   Potassium 3.7  3.5 - 5.1 (mEq/L)   Chloride 108  96 - 112 (mEq/L)   CO2 22  19 - 32 (mEq/L)   Glucose, Bld 92  70 - 99 (mg/dL)   BUN 5 (*) 6 - 23 (mg/dL)   Creatinine, Ser 1.61  0.50 - 1.35 (mg/dL)   Calcium 9.3  8.4 - 09.6 (mg/dL)   Total Protein 7.8  6.0 - 8.3 (g/dL)   Albumin 4.4  3.5 - 5.2 (g/dL)   AST 25  0 - 37 (U/L)   ALT 24  0 - 53 (U/L)   Alkaline Phosphatase 93  39 - 117 (U/L)   Total Bilirubin 5.8 (*) 0.3 - 1.2 (mg/dL)   GFR calc non Af Amer >90  >90 (mL/min)   GFR calc Af Amer >90  >90 (mL/min)  CBC     Status: Abnormal   Collection Time   12/20/10  4:00 AM      Component Value Range   WBC 19.8 (*) 4.0 - 10.5 (K/uL)   RBC 2.46 (*) 4.22 - 5.81 (MIL/uL)   Hemoglobin 7.4 (*) 13.0 - 17.0 (g/dL)   HCT 04.5 (*) 40.9 -  52.0 (%)   MCV 90.7  78.0 - 100.0 (fL)   MCH 30.1  26.0 - 34.0 (pg)   MCHC 33.2  30.0 - 36.0 (g/dL)   RDW 40.9 (*) 81.1 - 15.5 (%)   Platelets 522 (*) 150 - 400 (K/uL)      Diagnostic Studies: No results found.  Allergies  Allergen Reactions  . Morphine And Related Rash     Medications: I have reviewed the patient's current medications.  Scheduled Meds Current Facility-Administered Medications  Medication Dose Route Frequency Provider Last Rate Last Dose  . dextrose 5 % and 0.45 % NaCl with KCl 20 mEq/L infusion   Intravenous Continuous Ricki Rodriguez, MD 50 mL/hr at 12/20/10 9147    . diphenhydrAMINE (BENADRYL) injection 12.5 mg  12.5 mg Intravenous Q4H PRN Larina Bras, NP   25 mg at 12/19/10 2011   Or  . diphenhydrAMINE (BENADRYL) 12.5 MG/5ML elixir 12.5 mg  12.5 mg Oral Q4H PRN Larina Bras, NP   12.5 mg at 12/17/10 2023  . enoxaparin (LOVENOX)  injection 40 mg  40 mg Subcutaneous Q24H Rachal A David   40 mg at 12/19/10 0941  . folic acid (FOLVITE) tablet 1 mg  1 mg Oral Daily Larina Bras, NP   1 mg at 12/19/10 0942  . HYDROmorphone (DILAUDID) injection 2 mg  2 mg Intravenous Q2H PRN Larina Bras, NP      . hydroxyurea (HYDREA) capsule 500 mg  500 mg Oral BID Rachal A David   500 mg at 12/19/10 2136  . ibuprofen (ADVIL,MOTRIN) tablet 800 mg  800 mg Oral Q8H PRN Rachal A David      . levalbuterol (XOPENEX) nebulizer solution 0.63 mg  0.63 mg Nebulization Q6H PRN Larina Bras, NP      . magnesium oxide (MAG-OX) tablet 400 mg  400 mg Oral BID Larina Bras, NP      . moxifloxacin (AVELOX) IVPB 400 mg  400 mg Intravenous Q24H Larina Bras, NP      . naloxone [facility]) injection 0.4 mg  0.4 mg Intravenous PRN Larina Bras, NP   0.4 mg at 12/20/10 8295   And  . sodium chloride 0.9 % injection 9 mL  9 mL Intravenous PRN Larina Bras, NP      . OLANZapine (ZYPREXA) tablet 10 mg  10 mg Oral QHS Larina Bras, NP   10 mg at 12/19/10 2137  . ondansetron (ZOFRAN) injection 4 mg  4 mg Intravenous Q4H PRN Larina Bras, NP       Or  . ondansetron (ZOFRAN) tablet 4 mg  4 mg Oral Q6H PRN Larina Bras, NP      . oxyCODONE (OXYCONTIN) 12 hr tablet 40 mg  40 mg Oral BID Rachal A David   40 mg at 12/19/10 2137  . pantoprazole (PROTONIX) EC tablet 40 mg  40 mg Oral Q1200 Larina Bras, NP   40 mg at 12/19/10 1250  . potassium chloride SA (K-DUR,KLOR-CON) CR tablet 20 mEq  20 mEq Oral BID Ricki Rodriguez, MD   20 mEq at 12/20/10 1023  . senna (SENOKOT) tablet 17.2 mg  2 tablet Oral Daily PRN Rachal A David      . sodium chloride 0.9 % injection 10 mL  10 mL Intracatheter PRN Willey Blade, MD   10 mL at 12/18/10 0452  . zolpidem (AMBIEN) tablet 10 mg  10 mg Oral QHS PRN Rachal A David   10 mg at 12/19/10 2206  . DISCONTD: azithromycin (ZITHROMAX)  500 mg in dextrose 5 % 250 mL IVPB  500 mg Intravenous Q24H Willey Blade, MD   500 mg at 12/19/10 1000  . DISCONTD: HYDROmorphone (DILAUDID) injection 2 mg  2 mg Intravenous Q3H PRN Larina Bras, NP      . DISCONTD: HYDROmorphone (DILAUDID) PCA injection 0.3 mg/mL   Intravenous Q4H Larina Bras, NP   4.99 mg at 12/20/10 0402  . DISCONTD: piperacillin-tazobactam (ZOSYN) IVPB 3.375 g  3.375 g Intravenous Q8H Willey Blade, MD   3.375 g at 12/20/10 1023       Assessment/Plan: Patient Active Problem List  Diagnoses  . Sickle cell anemia  . Leukocytosis  . Chronic pain  . Hypokalemia    The is receiving pain management, IV hydration, IV antibiotics, medication for pruritis, insomnia, nausea, potassium repletion, nebulizer treatments, IS,  in addition to  GI and DVT prophylaxis. Discussed and agreed upon plan of care with the patient.  Will adjust plan of care based on the patient's clinical progress.   Larina Bras, NP-C 12/20/2010, 12:05 PM

## 2010-12-21 LAB — COMPREHENSIVE METABOLIC PANEL
Albumin: 4.2 g/dL (ref 3.5–5.2)
Alkaline Phosphatase: 89 U/L (ref 39–117)
BUN: 7 mg/dL (ref 6–23)
Calcium: 9.1 mg/dL (ref 8.4–10.5)
Potassium: 3.8 mEq/L (ref 3.5–5.1)
Total Protein: 7.6 g/dL (ref 6.0–8.3)

## 2010-12-21 LAB — CBC
HCT: 21.3 % — ABNORMAL LOW (ref 39.0–52.0)
Hemoglobin: 7.3 g/dL — ABNORMAL LOW (ref 13.0–17.0)
MCV: 90.3 fL (ref 78.0–100.0)
RBC: 2.36 MIL/uL — ABNORMAL LOW (ref 4.22–5.81)
RDW: 18.5 % — ABNORMAL HIGH (ref 11.5–15.5)
WBC: 16.1 10*3/uL — ABNORMAL HIGH (ref 4.0–10.5)

## 2010-12-21 MED ORDER — ZOLPIDEM TARTRATE 5 MG PO TABS
5.0000 mg | ORAL_TABLET | Freq: Every evening | ORAL | Status: DC | PRN
  Administered 2010-12-22: 5 mg via ORAL
  Filled 2010-12-21 (×2): qty 1

## 2010-12-21 MED ORDER — DIPHENHYDRAMINE HCL 12.5 MG/5ML PO ELIX: 12.5000 mg | ORAL_SOLUTION | ORAL | Status: DC | PRN

## 2010-12-21 MED ORDER — DEXTROSE-NACL 5-0.45 % IV SOLN
INTRAVENOUS | Status: DC
  Administered 2010-12-21 – 2010-12-22 (×2): via INTRAVENOUS

## 2010-12-21 MED ORDER — POTASSIUM CHLORIDE CRYS ER 20 MEQ PO TBCR: 40.0000 meq | EXTENDED_RELEASE_TABLET | Freq: Two times a day (BID) | ORAL | Status: DC

## 2010-12-21 MED ORDER — DIPHENHYDRAMINE HCL 50 MG/ML IJ SOLN
25.0000 mg | INTRAMUSCULAR | Status: DC | PRN
  Administered 2010-12-21 – 2010-12-23 (×12): 25 mg via INTRAVENOUS
  Filled 2010-12-21 (×12): qty 1

## 2010-12-21 MED ORDER — DEXTROSE 5 % IV SOLN
2000.0000 mg | Freq: Once | INTRAVENOUS | Status: AC
  Administered 2010-12-21: 2000 mg via INTRAVENOUS
  Filled 2010-12-21 (×2): qty 2

## 2010-12-21 NOTE — Progress Notes (Signed)
Clinical Social Worker left brief psychosocial assessment in shadow chart. CSW will continue to follow as needed.  Kaizer Dissinger J Kaimana Lurz MSW, LCSW [phone] 

## 2010-12-21 NOTE — Progress Notes (Signed)
Subjective: The patient was seen on rounds today.  The patient was sitting in his bed, watching television and speaking with his nurse.  The patient is complaining of pain 7/10 in his bilateral CVA/flank, in his lower back and mild pain in RUQ area.   The patient is also complaining of increased pruritis.  The patient's nurses have noticed increased confusion when the patient receives Ambien.  Let the patient know that he will be getting an exchange transfusion today.  The patient's Ambien dose has been decreased and IV Benadryl dose has been increased.  I continue to encourage the patient to use his IS.  No other patient or nursing concerns.   Objective: Vital signs in last 24 hours: Filed Vitals:   12/21/10 0400 12/21/10 0635 12/21/10 0636 12/21/10 0637  BP:  124/74 121/75 122/83  Pulse:  73 82 94  Temp:  98.2 F (36.8 C)    TempSrc:  Oral    Resp: 16 16    Height:      Weight:      SpO2: 98% 94%      Bowel Movement:   Last BM Date: 12/19/10  Physical Exam: GENERAL: alert, no visible distress, well developed, well nourished.   HEAD: Normocephalic, without obvious abnormality, atraumatic EYES: conjunctivae/corneas clear. PERRL, EOM's intact, scleral icterus THROAT: lips, mucosa, and tongue normal; teeth and gums normal NECK: no adenopathy, supple, symmetrical, trachea midline and thyroid not enlarged, symmetric, no tenderness/mass/nodules RESP: diminished breath sounds bibasilar, CVA tenderness L > R, faint expiratory wheezing LUL CARDIO: regular rate and rhythm, S1, S2 normal, no murmur, click, rub or gallop GI: soft, tender RUQ; bowel sounds are hypoactive; no masses,  no organomegaly GU: deferred, the patient denies priapism MS: extremities normal, atraumatic, no cyanosis or edema and Homans sign is negative, no sign of DVT, pulses 2+ and symmetric NEURO: Alert and oriented X 3, normal strength and tone, normal coordination and gait Mental status: Alert, oriented, thought content  appropriate Cranial nerves: normal Motor: grossly normal PSYCH:  Appropriate affect at times, irritable affect at other times  Lab Results: Results for orders placed during the hospital encounter of 12/16/10 (from the past 24 hour(s))  COMPREHENSIVE METABOLIC PANEL     Status: Abnormal   Collection Time   12/21/10  5:20 AM      Component Value Range   Sodium 138  135 - 145 (mEq/L)   Potassium 3.8  3.5 - 5.1 (mEq/L)   Chloride 108  96 - 112 (mEq/L)   CO2 20  19 - 32 (mEq/L)   Glucose, Bld 106 (*) 70 - 99 (mg/dL)   BUN 7  6 - 23 (mg/dL)   Creatinine, Ser 4.09  0.50 - 1.35 (mg/dL)   Calcium 9.1  8.4 - 81.1 (mg/dL)   Total Protein 7.6  6.0 - 8.3 (g/dL)   Albumin 4.2  3.5 - 5.2 (g/dL)   AST 23  0 - 37 (U/L)   ALT 22  0 - 53 (U/L)   Alkaline Phosphatase 89  39 - 117 (U/L)   Total Bilirubin 4.0 (*) 0.3 - 1.2 (mg/dL)   GFR calc non Af Amer >90  >90 (mL/min)   GFR calc Af Amer >90  >90 (mL/min)  CBC     Status: Abnormal   Collection Time   12/21/10  5:20 AM      Component Value Range   WBC 16.1 (*) 4.0 - 10.5 (K/uL)   RBC 2.36 (*) 4.22 - 5.81 (MIL/uL)  Hemoglobin 7.3 (*) 13.0 - 17.0 (g/dL)   HCT 16.1 (*) 09.6 - 52.0 (%)   MCV 90.3  78.0 - 100.0 (fL)   MCH 30.9  26.0 - 34.0 (pg)   MCHC 34.3  30.0 - 36.0 (g/dL)   RDW 04.5 (*) 40.9 - 15.5 (%)   Platelets 467 (*) 150 - 400 (K/uL)  MAGNESIUM     Status: Normal   Collection Time   12/21/10  5:20 AM      Component Value Range   Magnesium 1.9  1.5 - 2.5 (mg/dL)      Diagnostic Studies: No results found.  Allergies  Allergen Reactions  . Morphine And Related Rash     Medications: I have reviewed the patient's current medications.  Scheduled Meds Current Facility-Administered Medications  Medication Dose Route Frequency Provider Last Rate Last Dose  . deferoxamine (DESFERAL) 2,000 mg in dextrose 5 % 500 mL infusion  2,000 mg Intravenous Once Larina Bras, NP      . dextrose 5 %-0.45 % sodium chloride infusion    Intravenous Continuous Larina Bras, NP      . diphenhydrAMINE (BENADRYL) injection 25 mg  25 mg Intravenous Q4H PRN Larina Bras, NP       Or  . diphenhydrAMINE (BENADRYL) 12.5 MG/5ML elixir 12.5 mg  12.5 mg Oral Q4H PRN Larina Bras, NP      . enoxaparin (LOVENOX) injection 40 mg  40 mg Subcutaneous Q24H Rachal A David   40 mg at 12/21/10 0819  . folic acid (FOLVITE) tablet 1 mg  1 mg Oral Daily Larina Bras, NP   1 mg at 12/21/10 0934  . HYDROmorphone (DILAUDID) PCA injection 0.3 mg/mL   Intravenous Q4H Willey Blade, MD   5.19 mg at 12/21/10 0400  . hydroxyurea (HYDREA) capsule 500 mg  500 mg Oral BID Rachal A David   500 mg at 12/21/10 0934  . ibuprofen (ADVIL,MOTRIN) tablet 800 mg  800 mg Oral TID Willey Blade, MD   800 mg at 12/21/10 0934  . levalbuterol (XOPENEX) nebulizer solution 0.63 mg  0.63 mg Nebulization Q6H PRN Larina Bras, NP      . magnesium oxide (MAG-OX) tablet 400 mg  400 mg Oral BID Larina Bras, NP   400 mg at 12/20/10 2223  . moxifloxacin (AVELOX) IVPB 400 mg  400 mg Intravenous Q24H Larina Bras, NP   400 mg at 12/20/10 1436  . naloxone [facility]) injection 0.4 mg  0.4 mg Intravenous PRN Larina Bras, NP   0.4 mg at 12/20/10 8119   And  . sodium chloride 0.9 % injection 9 mL  9 mL Intravenous PRN Larina Bras, NP      . naloxone [facility]) injection 0.4 mg  0.4 mg Intravenous PRN Willey Blade, MD       And  . sodium chloride 0.9 % injection 9 mL  9 mL Intravenous PRN Willey Blade, MD      . OLANZapine (ZYPREXA) tablet 10 mg  10 mg Oral QHS Larina Bras, NP   10 mg at 12/20/10 2223  . ondansetron (ZOFRAN) injection 4 mg  4 mg Intravenous Q4H PRN Larina Bras, NP       Or  . ondansetron (ZOFRAN) tablet 4 mg  4 mg Oral Q6H PRN Larina Bras, NP      . ondansetron (ZOFRAN) injection 4 mg  4 mg Intravenous Q6H PRN Willey Blade, MD      . oxyCODONE (OXYCONTIN) 12 hr tablet 40 mg  40 mg Oral  BID Rachal A David   40 mg at 12/20/10 2223    . pantoprazole (PROTONIX) EC tablet 40 mg  40 mg Oral Q1200 Larina Bras, NP   40 mg at 12/20/10 1223  . senna (SENOKOT) tablet 17.2 mg  2 tablet Oral Daily PRN Rachal A David      . sodium chloride 0.9 % injection 10 mL  10 mL Intracatheter PRN Willey Blade, MD   10 mL at 12/21/10 0813  . zolpidem (AMBIEN) tablet 5 mg  5 mg Oral QHS PRN Larina Bras, NP        Assessment/Plan: Patient Active Problem List  Diagnoses  . Sickle cell anemia  . Leukocytosis  . Chronic pain  . Hypokalemia    The is receiving pain management, IV hydration, IV antibiotics, medication for pruritis, insomnia, nausea, nebulizer treatments, IS,  in addition to  GI and DVT prophylaxis. The patient will receive an exchange transfusion today in addition to one dose of Desferal. Discussed and agreed upon plan of care with the patient.   Hypokalemia has resolved.   Plan of care will be adjusted based on the patient's clinical progress.   Larina Bras, NP-C 12/21/2010, 9:50 AM

## 2010-12-22 LAB — COMPREHENSIVE METABOLIC PANEL
Albumin: 4 g/dL (ref 3.5–5.2)
BUN: 8 mg/dL (ref 6–23)
Calcium: 9 mg/dL (ref 8.4–10.5)
Creatinine, Ser: 0.6 mg/dL (ref 0.50–1.35)
Potassium: 3.8 mEq/L (ref 3.5–5.1)
Total Protein: 7.2 g/dL (ref 6.0–8.3)

## 2010-12-22 LAB — CBC
HCT: 21.7 % — ABNORMAL LOW (ref 39.0–52.0)
Hemoglobin: 7.2 g/dL — ABNORMAL LOW (ref 13.0–17.0)
MCV: 90 fL (ref 78.0–100.0)
RBC: 2.41 MIL/uL — ABNORMAL LOW (ref 4.22–5.81)
WBC: 13.4 10*3/uL — ABNORMAL HIGH (ref 4.0–10.5)

## 2010-12-22 LAB — TYPE AND SCREEN
Antibody Screen: NEGATIVE
Unit division: 0

## 2010-12-22 LAB — RETICULOCYTES: Retic Count, Absolute: 142.2 10*3/uL (ref 19.0–186.0)

## 2010-12-23 LAB — COMPREHENSIVE METABOLIC PANEL
ALT: 27 U/L (ref 0–53)
AST: 41 U/L — ABNORMAL HIGH (ref 0–37)
Albumin: 4.3 g/dL (ref 3.5–5.2)
CO2: 20 mEq/L (ref 19–32)
Chloride: 107 mEq/L (ref 96–112)
GFR calc non Af Amer: >90 mL/min (ref >90)
Sodium: 137 mEq/L (ref 135–145)
Total Bilirubin: 4 mg/dL — ABNORMAL HIGH (ref 0.3–1.2)

## 2010-12-23 LAB — CULTURE, BLOOD (ROUTINE X 2)
Culture  Setup Time: 201211161411
Culture: NO GROWTH

## 2010-12-23 LAB — CBC
Hemoglobin: 7.2 g/dL — ABNORMAL LOW (ref 13.0–17.0)
MCH: 31.2 pg (ref 26.0–34.0)
MCHC: 35.1 g/dL (ref 30.0–36.0)
MCV: 88.7 fL (ref 78.0–100.0)
Platelets: 502 10*3/uL — ABNORMAL HIGH (ref 150–400)

## 2010-12-23 MED ORDER — OLANZAPINE 10 MG PO TABS: 10.0000 mg | ORAL_TABLET | Freq: Every day | ORAL | Status: DC

## 2010-12-23 MED ORDER — HYDROMORPHONE HCL 4 MG PO TABS: 4.0000 mg | ORAL_TABLET | ORAL | Status: DC | PRN

## 2010-12-23 MED ORDER — FOLIC ACID 1 MG PO TABS: 1.0000 mg | ORAL_TABLET | Freq: Every day | ORAL | Status: DC

## 2010-12-23 MED ORDER — OXYCODONE HCL 40 MG PO TB12: 40.0000 mg | ORAL_TABLET | Freq: Two times a day (BID) | ORAL | Status: DC

## 2010-12-23 NOTE — Progress Notes (Signed)
Per MD order, PICC line removed. Cath intact at 43cm. Vaseline pressure gauze to site, pressure held x . No bleeding to site. Pt instructed to keep dressing CDI x 24 hours. Avoid heavy lifting, pushing or pulling x 24 hours,  If bleeding occurs hold pressure, if bleeding does not stop contact MD or go to the ED. Pt does not have any questions. Dewain Penning RN

## 2010-12-24 LAB — CULTURE, BLOOD (ROUTINE X 2): Culture  Setup Time: 201211170128

## 2010-12-27 LAB — ALDOSTERONE: Aldosterone, Serum: <1 ng/dL

## 2011-01-03 ENCOUNTER — Emergency Department (HOSPITAL_COMMUNITY)
Admission: EM | Admit: 2011-01-03 | Discharge: 2011-01-03 | Discharge status: Against Medical Advice | Payer: Medicare Other | Attending: Emergency Medicine | Admitting: Emergency Medicine

## 2011-01-03 DIAGNOSIS — R197 Diarrhea, unspecified: Secondary | ICD-10-CM | POA: Insufficient documentation

## 2011-01-03 DIAGNOSIS — Z79899 Other long term (current) drug therapy: Secondary | ICD-10-CM | POA: Insufficient documentation

## 2011-01-03 DIAGNOSIS — R112 Nausea with vomiting, unspecified: Secondary | ICD-10-CM | POA: Insufficient documentation

## 2011-01-03 DIAGNOSIS — R1110 Vomiting, unspecified: Secondary | ICD-10-CM | POA: Insufficient documentation

## 2011-01-03 DIAGNOSIS — D571 Sickle-cell disease without crisis: Secondary | ICD-10-CM | POA: Insufficient documentation

## 2011-01-03 NOTE — ED Notes (Signed)
Pt. Called 2x with no response.

## 2011-01-03 NOTE — ED Notes (Signed)
Unable to locate pt. Pt called in triage and lobby, no answer x 1

## 2011-01-04 ENCOUNTER — Inpatient Hospital Stay (HOSPITAL_COMMUNITY)
Admission: EM | Admit: 2011-01-04 | Discharge: 2011-01-15 | DRG: 812 | Disposition: A | Discharge status: Home / Self Care | Payer: Medicare Other | Attending: Internal Medicine | Admitting: Internal Medicine

## 2011-01-04 ENCOUNTER — Emergency Department (HOSPITAL_COMMUNITY)
Admission: EM | Admit: 2011-01-04 | Discharge: 2011-01-04 | Disposition: A | Discharge status: Home / Self Care | Payer: Medicare Other | Source: Home / Self Care | Attending: Emergency Medicine | Admitting: Emergency Medicine

## 2011-01-04 ENCOUNTER — Other Ambulatory Visit: Payer: Self-pay

## 2011-01-04 ENCOUNTER — Encounter (HOSPITAL_COMMUNITY): Payer: Self-pay

## 2011-01-04 ENCOUNTER — Encounter (HOSPITAL_COMMUNITY): Payer: Self-pay | Admitting: Emergency Medicine

## 2011-01-04 ENCOUNTER — Emergency Department (HOSPITAL_COMMUNITY): Payer: Medicare Other

## 2011-01-04 DIAGNOSIS — G894 Chronic pain syndrome: Secondary | ICD-10-CM | POA: Diagnosis present

## 2011-01-04 DIAGNOSIS — D72829 Elevated white blood cell count, unspecified: Secondary | ICD-10-CM | POA: Diagnosis present

## 2011-01-04 DIAGNOSIS — R197 Diarrhea, unspecified: Secondary | ICD-10-CM

## 2011-01-04 DIAGNOSIS — M87029 Idiopathic aseptic necrosis of unspecified humerus: Secondary | ICD-10-CM | POA: Diagnosis present

## 2011-01-04 DIAGNOSIS — A088 Other specified intestinal infections: Secondary | ICD-10-CM | POA: Diagnosis present

## 2011-01-04 DIAGNOSIS — G8929 Other chronic pain: Secondary | ICD-10-CM | POA: Diagnosis present

## 2011-01-04 DIAGNOSIS — Z87891 Personal history of nicotine dependence: Secondary | ICD-10-CM

## 2011-01-04 DIAGNOSIS — D571 Sickle-cell disease without crisis: Secondary | ICD-10-CM

## 2011-01-04 DIAGNOSIS — D5700 Hb-SS disease with crisis, unspecified: Principal | ICD-10-CM | POA: Diagnosis present

## 2011-01-04 DIAGNOSIS — Z96649 Presence of unspecified artificial hip joint: Secondary | ICD-10-CM

## 2011-01-04 DIAGNOSIS — Z79899 Other long term (current) drug therapy: Secondary | ICD-10-CM

## 2011-01-04 DIAGNOSIS — E876 Hypokalemia: Secondary | ICD-10-CM | POA: Diagnosis present

## 2011-01-04 LAB — DIFFERENTIAL
Basophils Absolute: 0 10*3/uL (ref 0.0–0.1)
Basophils Relative: 0 % (ref 0–1)
Eosinophils Absolute: 0.2 10*3/uL (ref 0.0–0.7)
Lymphs Abs: 3.4 10*3/uL (ref 0.7–4.0)
Neutro Abs: 18.4 10*3/uL — ABNORMAL HIGH (ref 1.7–7.7)

## 2011-01-04 LAB — CBC
HCT: 25 % — ABNORMAL LOW (ref 39.0–52.0)
Hemoglobin: 8.2 g/dL — ABNORMAL LOW (ref 13.0–17.0)
MCHC: 32.8 g/dL (ref 30.0–36.0)
MCV: 91.6 fL (ref 78.0–100.0)
RDW: 17.9 % — ABNORMAL HIGH (ref 11.5–15.5)

## 2011-01-04 LAB — RETICULOCYTES
RBC.: 2.73 MIL/uL — ABNORMAL LOW (ref 4.22–5.81)
Retic Ct Pct: 6.6 % — ABNORMAL HIGH (ref 0.4–3.1)

## 2011-01-04 LAB — BASIC METABOLIC PANEL
BUN: 6 mg/dL (ref 6–23)
Chloride: 106 mEq/L (ref 96–112)
GFR calc Af Amer: >90 mL/min (ref >90)
GFR calc non Af Amer: >90 mL/min (ref >90)
Potassium: 2.9 mEq/L — ABNORMAL LOW (ref 3.5–5.1)
Sodium: 140 mEq/L (ref 135–145)

## 2011-01-04 MED ORDER — HYDROMORPHONE HCL PF 1 MG/ML IJ SOLN
1.0000 mg | Freq: Once | INTRAMUSCULAR | Status: AC
  Administered 2011-01-04: 1 mg via INTRAVENOUS
  Filled 2011-01-04: qty 1

## 2011-01-04 MED ORDER — ONDANSETRON HCL 4 MG/2ML IJ SOLN
4.0000 mg | Freq: Once | INTRAMUSCULAR | Status: AC
  Administered 2011-01-04: 4 mg via INTRAVENOUS
  Filled 2011-01-04: qty 2

## 2011-01-04 MED ORDER — HYDROMORPHONE HCL PF 1 MG/ML IJ SOLN: 1.0000 mg | Freq: Once | INTRAMUSCULAR | Status: DC

## 2011-01-04 MED ORDER — DIPHENHYDRAMINE HCL 50 MG/ML IJ SOLN
INTRAMUSCULAR | Status: AC
  Filled 2011-01-04: qty 1

## 2011-01-04 MED ORDER — DIPHENHYDRAMINE HCL 50 MG/ML IJ SOLN
12.5000 mg | Freq: Once | INTRAMUSCULAR | Status: AC
  Administered 2011-01-04: 12.5 mg via INTRAVENOUS
  Filled 2011-01-04: qty 1

## 2011-01-04 MED ORDER — DIPHENHYDRAMINE HCL 50 MG/ML IJ SOLN
25.0000 mg | Freq: Once | INTRAMUSCULAR | Status: AC
  Administered 2011-01-04: 25 mg via INTRAVENOUS

## 2011-01-04 MED ORDER — ONDANSETRON HCL 4 MG/2ML IJ SOLN
4.0000 mg | Freq: Four times a day (QID) | INTRAMUSCULAR | Status: DC | PRN
  Administered 2011-01-04 – 2011-01-14 (×10): 4 mg via INTRAVENOUS
  Filled 2011-01-04 (×27): qty 2

## 2011-01-04 MED ORDER — POTASSIUM CHLORIDE CRYS ER 20 MEQ PO TBCR
40.0000 meq | EXTENDED_RELEASE_TABLET | Freq: Once | ORAL | Status: AC
  Administered 2011-01-04: 40 meq via ORAL
  Filled 2011-01-04: qty 2

## 2011-01-04 MED ORDER — HYDROMORPHONE HCL PF 2 MG/ML IJ SOLN
INTRAMUSCULAR | Status: AC
  Filled 2011-01-04: qty 1

## 2011-01-04 MED ORDER — ONDANSETRON HCL 4 MG/2ML IJ SOLN
INTRAMUSCULAR | Status: AC
  Filled 2011-01-04: qty 2

## 2011-01-04 MED ORDER — SODIUM CHLORIDE 0.9 % IV SOLN
Freq: Once | INTRAVENOUS | Status: AC
  Administered 2011-01-04: 10:00:00 via INTRAVENOUS

## 2011-01-04 MED ORDER — PROMETHAZINE HCL 25 MG PO TABS: 25.0000 mg | ORAL_TABLET | Freq: Four times a day (QID) | ORAL | Status: AC | PRN

## 2011-01-04 MED ORDER — HYDROMORPHONE HCL PF 2 MG/ML IJ SOLN
2.0000 mg | Freq: Once | INTRAMUSCULAR | Status: AC
  Administered 2011-01-04: 1 mg via INTRAVENOUS

## 2011-01-04 MED ORDER — FENTANYL CITRATE 0.05 MG/ML IJ SOLN
INTRAMUSCULAR | Status: AC
  Filled 2011-01-04: qty 2

## 2011-01-04 MED ORDER — HYDROMORPHONE HCL PF 1 MG/ML IJ SOLN
1.0000 mg | INTRAMUSCULAR | Status: DC | PRN
  Administered 2011-01-04 – 2011-01-05 (×5): 1 mg via INTRAVENOUS
  Filled 2011-01-04 (×4): qty 1

## 2011-01-04 MED ORDER — HYDROMORPHONE HCL PF 2 MG/ML IJ SOLN
INTRAMUSCULAR | Status: AC
  Administered 2011-01-04: 1 mg
  Filled 2011-01-04: qty 1

## 2011-01-04 MED ORDER — HYDROMORPHONE HCL PF 1 MG/ML IJ SOLN
1.0000 mg | Freq: Once | INTRAMUSCULAR | Status: AC
  Administered 2011-01-04: 1 mg via INTRAVENOUS

## 2011-01-04 MED ORDER — DIPHENHYDRAMINE HCL 50 MG/ML IJ SOLN
12.5000 mg | Freq: Four times a day (QID) | INTRAMUSCULAR | Status: DC | PRN
  Administered 2011-01-04: 50 mg via INTRAVENOUS
  Administered 2011-01-05 – 2011-01-08 (×11): 12.5 mg via INTRAVENOUS
  Filled 2011-01-04 (×11): qty 1

## 2011-01-04 MED ORDER — HYDROMORPHONE HCL PF 2 MG/ML IJ SOLN
2.0000 mg | Freq: Once | INTRAMUSCULAR | Status: AC
  Administered 2011-01-04: 2 mg via INTRAVENOUS

## 2011-01-04 MED ORDER — ONDANSETRON HCL 4 MG/2ML IJ SOLN: 4.0000 mg | Freq: Three times a day (TID) | INTRAMUSCULAR | Status: DC | PRN

## 2011-01-04 MED ORDER — ONDANSETRON HCL 4 MG/2ML IJ SOLN
4.0000 mg | Freq: Once | INTRAMUSCULAR | Status: AC
  Administered 2011-01-04: 4 mg via INTRAVENOUS

## 2011-01-04 MED ORDER — SODIUM CHLORIDE 0.9 % IV BOLUS (SEPSIS)
2000.0000 mL | Freq: Once | INTRAVENOUS | Status: AC
  Administered 2011-01-04: 2000 mL via INTRAVENOUS

## 2011-01-04 MED ORDER — SODIUM CHLORIDE 0.9 % IV BOLUS (SEPSIS)
1000.0000 mL | Freq: Once | INTRAVENOUS | Status: AC
  Administered 2011-01-04: 1000 mL via INTRAVENOUS

## 2011-01-04 MED ORDER — FENTANYL CITRATE 0.05 MG/ML IJ SOLN
50.0000 ug | Freq: Once | INTRAMUSCULAR | Status: AC
  Administered 2011-01-04: 50 ug via INTRAVENOUS

## 2011-01-04 MED ORDER — HYDROMORPHONE HCL PF 1 MG/ML IJ SOLN: 1.0000 mg | INTRAMUSCULAR | Status: DC | PRN

## 2011-01-04 NOTE — ED Provider Notes (Signed)
12:03 PM Patient is in CDU under observation, sickle cell protocol.  This is a shared visit with Dr Charline Bills. Lynelle Doctor and Ebbie Ridge, PA-C.  Patient reports typical sickle cell pain in his left ribs and low back.  Earlier in the week, patient had N/V/D illness that is resolving.  Pain is currently an 8/10.  On exam, pt is A&Ox4, NAD, RRR, CTAB, chest and spine nontender.    3:23 PM Patient signed out to Thomasene Lot, PA-C, at change of shift.    Dillard Cannon [place], [place] 01/04/11 [phone]

## 2011-01-04 NOTE — ED Notes (Signed)
Pt presented to the ER with c/o abd and back pain, nausea and vomiting x6 in the last 24hr, pt further states that s/s started last night, pt reports taking OxyContin, Dx with sickle cell, but pt run out of his medications.

## 2011-01-04 NOTE — ED Provider Notes (Signed)
History     CSN: [id number] Arrival date & time: 01/04/2011  1:43 AM   None     Chief Complaint  Patient presents with  . Emesis  . Diarrhea  . Sickle Cell Pain Crisis    (Consider location/radiation/quality/duration/timing/severity/associated sxs/prior treatment) Patient is a 30 y.o. male presenting with vomiting, diarrhea, and sickle cell pain. The history is provided by the patient.  Emesis  This is a new problem. The current episode started yesterday. The problem occurs 2 to 4 times per day. The problem has been gradually improving. The emesis has an appearance of stomach contents. There has been no fever. Associated symptoms include diarrhea. Pertinent negatives include no abdominal pain, no chills, no fever and no headaches. Risk factors include ill contacts.  Diarrhea The primary symptoms include vomiting and diarrhea. Primary symptoms do not include fever, abdominal pain, dysuria or rash.  The illness does not include chills or back pain.  Sickle Cell Pain Crisis  Associated symptoms include diarrhea and vomiting. Pertinent negatives include no chest pain, no abdominal pain, no dysuria, no headaches, no back pain, no neck pain, no rash and no eye pain.   Last emesis was about 24 hours ago. Feeling better with medications provided in the emergency department. His aunt at home has similar symptoms. No headache, sore throat or fevers. No rash or abdominal pains. He had some cramping with diarrhea is intermittent not resolved.NB/ NB emesis MOD in severity.   Past Medical History  Diagnosis Date  . Sickle cell anemia     Past Surgical History  Procedure Date  . Joint replacement     right hip  . Hip surgery     History reviewed. No pertinent family history.  History  Substance Use Topics  . Smoking status: Former Games developer  . Smokeless tobacco: Never Used  . Alcohol Use: No      Review of Systems  Constitutional: Negative for fever and chills.  HENT: Negative for  neck pain and neck stiffness.   Eyes: Negative for pain.  Respiratory: Negative for shortness of breath.   Cardiovascular: Negative for chest pain.  Gastrointestinal: Positive for vomiting and diarrhea. Negative for abdominal pain, blood in stool and rectal pain.  Genitourinary: Negative for dysuria.  Musculoskeletal: Negative for back pain.  Skin: Negative for rash.  Neurological: Negative for headaches.  All other systems reviewed and are negative.    Allergies  Morphine and related  Home Medications   Current Outpatient Rx  Name Route Sig Dispense Refill  . FOLIC ACID 1 MG PO TABS Oral Take 1 tablet (1 mg total) by mouth daily. 100 tablet 2  . HYDROMORPHONE HCL 4 MG PO TABS Oral Take 1 tablet (4 mg total) by mouth every 4 (four) hours as needed. For pain 50 tablet 0  . HYDROXYUREA 500 MG PO CAPS Oral Take 500 mg by mouth 2 (two) times daily. May take with food to minimize GI side effects.    . IBUPROFEN 800 MG PO TABS Oral Take 800 mg by mouth every 8 (eight) hours as needed. pain    . OLANZAPINE 10 MG PO TABS Oral Take 1 tablet (10 mg total) by mouth at bedtime. 30 tablet 1  . OXYCODONE HCL ER 40 MG PO TB12 Oral Take 1 tablet (40 mg total) by mouth every 12 (twelve) hours. pain 30 tablet 0  . ZOLPIDEM TARTRATE 10 MG PO TABS Oral Take 10 mg by mouth at bedtime as needed. sleep  BP 122/81  Pulse 83  Temp(Src) 98.1 F (36.7 C) (Oral)  Resp 20  SpO2 99%  Physical Exam  Constitutional: He is oriented to person, place, and time. He appears well-developed and well-nourished.  HENT:  Head: Normocephalic and atraumatic.  Eyes: Conjunctivae and EOM are normal. Pupils are equal, round, and reactive to light.  Neck: Full passive range of motion without pain. Neck supple. No thyromegaly present.  Cardiovascular: Normal rate, regular rhythm, S1 normal, S2 normal and intact distal pulses.   Pulmonary/Chest: Effort normal and breath sounds normal.  Abdominal: Soft. Bowel sounds  are normal. There is no tenderness. There is no CVA tenderness.  Musculoskeletal: Normal range of motion.  Neurological: He is alert and oriented to person, place, and time. He has normal strength and normal reflexes. No cranial nerve deficit or sensory deficit. He displays a negative Romberg sign. GCS eye subscore is 4. GCS verbal subscore is 5. GCS motor subscore is 6.  Skin: Skin is warm and dry. No rash noted. No cyanosis. Nails show no clubbing.  Psychiatric: He has a normal mood and affect. His speech is normal and behavior is normal.    ED Course  Procedures (including critical care time)   IV fluids and antiemetics provided. Patient tolerates by mouth fluids no emesis in ED. No abdominal tenderness or peritonitis on serial abdominal exams.  MDM   Nausea vomiting and diarrhea with nausea and vomiting resolved. Pain medications provided for some of the sickle cell pains, but no abdominal pains. No acute abdomen.        Sunnie Nielsen, MD 01/04/11 [phone]

## 2011-01-04 NOTE — ED Notes (Signed)
Pt requesting something for nausea, Zofran 4mg . IV given

## 2011-01-04 NOTE — ED Notes (Signed)
Patient states that he has hx of sickle cell disease. Starting last night he states that he developed severe sickle cell pain around his entire chest. When asked to localize he points to the rib cage area. He has been taking his regular medications of dilaudid and hydroxyzine with no relief. He denies any fevers but states that he has also been having cold s/s with congestion and a non productive cough. Iv started and labs obtained. Bolus infusing. Protocol medications given. ekg obtained due to complaints of chest pain associated with his sickle cell. Breath sounds are clear and bowel sounds are present. Pt is alert and oriented. He was waiting at another facility but states that it was taking too long so left before being seen and came over for evaluation.

## 2011-01-04 NOTE — ED Notes (Signed)
Gave old and new ECG to Dr. Lynelle Doctor. 10:20 am JG

## 2011-01-04 NOTE — ED Provider Notes (Signed)
See prior note   Ward Givens, MD 01/04/11 [phone]

## 2011-01-04 NOTE — ED Provider Notes (Signed)
See CDU note  Ward Givens, MD 01/04/11 1911

## 2011-01-04 NOTE — ED Provider Notes (Signed)
Evaluation and management procedures were performed by the PA/NP under my supervision/collaboration.   Dione Booze, MD 01/04/11 2352

## 2011-01-04 NOTE — ED Notes (Signed)
Pt c/o lwer back pain radiating into mid back x1 day, pt also reports N/V/D 2 days ago, pt sts "I also have sickle cell, this is usually where my [place] pain starts."

## 2011-01-04 NOTE — ED Notes (Signed)
Pt requesting more pain meds, Benadryl and something to eat.  Explained to pt that it was not time for his meds at this time.  Pt voices understanding.  [place] Sandwich and snacks given to pt.

## 2011-01-04 NOTE — ED Provider Notes (Signed)
History     CSN: [id number] Arrival date & time: 01/04/2011  9:03 AM   First MD Initiated Contact with Patient 01/04/11 1027      Chief Complaint  Patient presents with  . Sickle Cell Pain Crisis    (Consider location/radiation/quality/duration/timing/severity/associated sxs/prior treatment) Patient is a 30 y.o. male presenting with sickle cell pain. The history is provided by the patient.  Sickle Cell Pain Crisis  This is a recurrent problem. The current episode started 2 days ago. The onset was gradual. The problem has been gradually worsening. The pain is associated with a recent illness. The pain is present in the right side and left side. The pain is similar to prior episodes. The pain is moderate. Associated symptoms include chest pain, abdominal pain, diarrhea, nausea, vomiting, headaches and back pain. Pertinent negatives include no blurred vision, no double vision, no constipation, no dysuria, no hematuria, no congestion, no swollen glands, no joint pain, no neck pain, no neck stiffness, no cough and no difficulty breathing. There is no swelling present. He has been behaving normally. He has been eating less than usual and drinking less than usual. Urine output has been normal. The last void occurred less than 6 hours ago. His past medical history does not include chronic back pain. He sickle cell type is SS. There have been frequent pain crises. There is no history of stroke. He has been treated with hydroxyurea. There were sick contacts at home. Recently, medical care has been given at another facility. Services received include medications given.   Pt presents to the emergency department for persistence of the current sickle cell crisis.  Pt states he had nausea, vomiting and diarrhea Saturday and Sunday with the upper abdominal and back pain occuring Sunday evening.  Pain is 7/10 and constant located in the upper abdominal and back pain (located under the lower 3 ribs).  Pt states this  feels like most of his previous sickle cell crisis episodes.  Pt was seen in the [facility] emergency room yesterday (01/04/11  1:43am), treated and released.  Pt continues to have pain but  Nausea, vomiting and diarrhea have resolved.     Past Medical History  Diagnosis Date  . Sickle cell anemia     Past Surgical History  Procedure Date  . Joint replacement     right hip  . Hip surgery     History reviewed. No pertinent family history.  History  Substance Use Topics  . Smoking status: Former Games developer  . Smokeless tobacco: Never Used  . Alcohol Use: No      Review of Systems  HENT: Negative for congestion and neck pain.   Eyes: Negative for blurred vision and double vision.  Respiratory: Negative for cough.   Cardiovascular: Positive for chest pain.  Gastrointestinal: Positive for nausea, vomiting, abdominal pain and diarrhea. Negative for constipation.  Genitourinary: Negative for dysuria and hematuria.  Musculoskeletal: Positive for back pain. Negative for joint pain.  Neurological: Positive for headaches.    Allergies  Morphine and related  Home Medications   Current Outpatient Rx  Name Route Sig Dispense Refill  . FOLIC ACID 1 MG PO TABS Oral Take 1 tablet (1 mg total) by mouth daily. 100 tablet 2  . HYDROMORPHONE HCL 4 MG PO TABS Oral Take 4 mg by mouth daily as needed. For pain     . HYDROXYUREA 500 MG PO CAPS Oral Take 500 mg by mouth 2 (two) times daily. May take with food to minimize  GI side effects.    . IBUPROFEN 800 MG PO TABS Oral Take 800 mg by mouth every 8 (eight) hours as needed. pain    . OLANZAPINE 10 MG PO TABS Oral Take 10 mg by mouth at bedtime as needed. for sleep.    . OXYCODONE HCL ER 40 MG PO TB12 Oral Take 1 tablet (40 mg total) by mouth every 12 (twelve) hours. pain 30 tablet 0  . ZOLPIDEM TARTRATE 10 MG PO TABS Oral Take 10 mg by mouth at bedtime as needed. sleep    . PROMETHAZINE HCL 25 MG PO TABS Oral Take 1 tablet (25 mg total) by mouth every 6  (six) hours as needed for nausea. 30 tablet 0    BP 129/87  Pulse 80  Temp 98.6 F (37 C)  Resp 18  SpO2 98%  Physical Exam  Constitutional: He is oriented to person, place, and time. He appears well-developed and well-nourished.  HENT:  Head: Normocephalic and atraumatic.  Eyes: Pupils are equal, round, and reactive to light.  Neck: Normal range of motion. Neck supple.  Cardiovascular: Normal rate and regular rhythm.   Pulmonary/Chest: Effort normal and breath sounds normal. No accessory muscle usage. Not tachypneic. No respiratory distress. He exhibits no tenderness.    Abdominal: Soft. Bowel sounds are normal. There is no hepatosplenomegaly. There is no tenderness.  Musculoskeletal: Normal range of motion.       Constant 7/10 pain radiating from the upper abdomen and lower ribs that extends around through the back  Neurological: He is alert and oriented to person, place, and time. He has normal reflexes.  Skin: Skin is warm and dry.  Psychiatric: He has a normal mood and affect. His behavior is normal. Judgment and thought content normal.    ED Course  Procedures (including critical care time)  Labs Reviewed  CBC - Abnormal; Notable for the following:    WBC 24.4 (*)    RBC 2.73 (*)    Hemoglobin 8.2 (*)    HCT 25.0 (*)    RDW 17.9 (*)    Platelets 632 (*)    All other components within normal limits  DIFFERENTIAL - Abnormal; Notable for the following:    Neutro Abs 18.4 (*)    Monocytes Absolute 2.4 (*)    All other components within normal limits  RETICULOCYTES - Abnormal; Notable for the following:    Retic Ct Pct 6.6 (*)    RBC. 2.73 (*)    All other components within normal limits  BASIC METABOLIC PANEL - Abnormal; Notable for the following:    Potassium 2.9 (*)    All other components within normal limits   No results found.   No diagnosis found.    MDM  Transferred to the CDU    Date: 01/04/2011  Rate: 66  Rhythm: normal sinus rhythm  QRS  Axis: normal  Intervals: normal  ST/T Wave abnormalities: normal  Conduction Disutrbances:nonspecific intraventricular conduction delay  Narrative Interpretation:   Old EKG Reviewed: unchanged       Carlyle Dolly, PA-C 01/04/11 1322  Carlyle Dolly, PA-C 01/04/11 1636

## 2011-01-04 NOTE — H&P (Signed)
[name] is an 30 y.o. male.   Chief Complaint: Painful crisis HPI: 30 YO with known history of Sickle cell disease and frequent admissions for such here with Sickle cell pain crisis. Has been on treatment in [facility] for 12 hours with no relief so is being admitted for further management. Pain is in left shoulder, Rib cage and other parts of the body. Currently at 10/10. No relieving factor aggravated by movement.  No NVD.  Past Medical History  Diagnosis Date  . Sickle cell anemia     Past Surgical History  Procedure Date  . Joint replacement     right hip  . Hip surgery     Family History  Problem Relation Age of Onset  . Sickle cell anemia Mother   . Sickle cell anemia Father    Social History:  reports that he has quit smoking. He has never used smokeless tobacco. He reports that he does not drink alcohol or use illicit drugs.  Allergies:  Allergies  Allergen Reactions  . Morphine And Related Rash    Medications Prior to Admission  Medication Dose Route Frequency Provider Last Rate Last Dose  . 0.9 %  sodium chloride infusion   Intravenous Once Ward Givens, MD 999 mL/hr at 01/04/11 1009    . diphenhydrAMINE (BENADRYL) 50 MG/ML injection           . diphenhydrAMINE (BENADRYL) injection 12.5 mg  12.5 mg Intravenous Once Rise Patience, PA   12.5 mg at 01/04/11 1212  . diphenhydrAMINE (BENADRYL) injection 12.5 mg  12.5 mg Intravenous Q6H PRN Jamesetta Orleans Lawyer, PA-C   50 mg at 01/04/11 1758  . diphenhydrAMINE (BENADRYL) injection 25 mg  25 mg Intravenous Once [employer], PA-C   25 mg at 01/04/11 1049  . fentaNYL (SUBLIMAZE) 0.05 MG/ML injection           . fentaNYL (SUBLIMAZE) injection 50 mcg  50 mcg Intravenous Once Ward Givens, MD   50 mcg at 01/04/11 1013  . HYDROmorphone (DILAUDID) 2 MG/ML injection        1 mg at 01/04/11 0620  . HYDROmorphone (DILAUDID) 2 MG/ML injection           . HYDROmorphone (DILAUDID) injection 1 mg  1 mg Intravenous Once  Jamesetta Orleans Lawyer, PA-C   1 mg at 01/04/11 1050  . HYDROmorphone (DILAUDID) injection 1 mg  1 mg Intravenous Once Rise Patience, PA   1 mg at 01/04/11 1213  . HYDROmorphone (DILAUDID) injection 1 mg  1 mg Intravenous Once Rise Patience, PA   1 mg at 01/04/11 1413  . HYDROmorphone (DILAUDID) injection 1 mg  1 mg Intravenous Q2H PRN Jamesetta Orleans Lawyer, PA-C   1 mg at 01/04/11 2036  . HYDROmorphone (DILAUDID) injection 1 mg  1 mg Intravenous Q4H PRN Thomasene Lot, PA      . HYDROmorphone (DILAUDID) injection 2 mg  2 mg Intravenous Once Sunnie Nielsen, MD   1 mg at 01/04/11 0651  . HYDROmorphone (DILAUDID) injection 2 mg  2 mg Intravenous Once Sunnie Nielsen, MD   2 mg at 01/04/11 0743  . ondansetron (ZOFRAN) 4 MG/2ML injection           . ondansetron (ZOFRAN) injection 4 mg  4 mg Intravenous Once Sunnie Nielsen, MD   4 mg at 01/04/11 1610  . ondansetron (ZOFRAN) injection 4 mg  4 mg Intravenous Once Ward Givens, MD   4 mg at  01/04/11 1013  . ondansetron (ZOFRAN) injection 4 mg  4 mg Intravenous Q6H PRN Jamesetta Orleans Lawyer, PA-C      . ondansetron [facility]) injection 4 mg  4 mg Intravenous Q8H PRN Thomasene Lot, PA      . potassium chloride SA (K-DUR,KLOR-CON) CR tablet 40 mEq  40 mEq Oral Once Rise Patience, PA   40 mEq at 01/04/11 1205  . sodium chloride 0.9 % bolus 1,000 mL  1,000 mL Intravenous Once Sunnie Nielsen, MD   1,000 mL at 01/04/11 0622  . sodium chloride 0.9 % bolus 2,000 mL  2,000 mL Intravenous Once Jamesetta Orleans Lawyer, PA-C   2,000 mL at 01/04/11 1110  . DISCONTD: HYDROmorphone (DILAUDID) injection 1 mg  1 mg Intravenous Once Sunnie Nielsen, MD       Medications Prior to Admission  Medication Sig Dispense Refill  . folic acid (FOLVITE) 1 MG tablet Take 1 tablet (1 mg total) by mouth daily.  100 tablet  2  . HYDROmorphone (DILAUDID) 4 MG tablet Take 4 mg by mouth daily as needed. For pain       . hydroxyurea (HYDREA) 500 MG capsule Take 500 mg by mouth 2 (two) times daily. May take with food  to minimize GI side effects.      Marland Kitchen ibuprofen (ADVIL,MOTRIN) 800 MG tablet Take 800 mg by mouth every 8 (eight) hours as needed. pain      . OLANZapine (ZYPREXA) 10 MG tablet Take 10 mg by mouth at bedtime as needed. for sleep.      Marland Kitchen oxyCODONE (OXYCONTIN) 40 MG 12 hr tablet Take 1 tablet (40 mg total) by mouth every 12 (twelve) hours. pain  30 tablet  0  . zolpidem (AMBIEN) 10 MG tablet Take 10 mg by mouth at bedtime as needed. sleep      . promethazine (PHENERGAN) 25 MG tablet Take 1 tablet (25 mg total) by mouth every 6 (six) hours as needed for nausea.  30 tablet  0    Results for orders placed during the hospital encounter of 01/04/11 (from the past 48 hour(s))  CBC     Status: Abnormal   Collection Time   01/04/11 10:16 AM      Component Value Range Comment   WBC 24.4 (*) 4.0 - 10.5 (K/uL)    RBC 2.73 (*) 4.22 - 5.81 (MIL/uL)    Hemoglobin 8.2 (*) 13.0 - 17.0 (g/dL)    HCT 16.1 (*) 09.6 - 52.0 (%)    MCV 91.6  78.0 - 100.0 (fL)    MCH 30.0  26.0 - 34.0 (pg)    MCHC 32.8  30.0 - 36.0 (g/dL)    RDW 04.5 (*) 40.9 - 15.5 (%)    Platelets 632 (*) 150 - 400 (K/uL)   DIFFERENTIAL     Status: Abnormal   Collection Time   01/04/11 10:16 AM      Component Value Range Comment   Neutrophils Relative 75  43 - 77 (%)    Lymphocytes Relative 14  12 - 46 (%)    Monocytes Relative 10  3 - 12 (%)    Eosinophils Relative 1  0 - 5 (%)    Basophils Relative 0  0 - 1 (%)    Neutro Abs 18.4 (*) 1.7 - 7.7 (K/uL)    Lymphs Abs 3.4  0.7 - 4.0 (K/uL)    Monocytes Absolute 2.4 (*) 0.1 - 1.0 (K/uL)    Eosinophils Absolute 0.2  0.0 -  0.7 (K/uL)    Basophils Absolute 0.0  0.0 - 0.1 (K/uL)    RBC Morphology POLYCHROMASIA PRESENT      Smear Review PLATELETS APPEAR INCREASED     RETICULOCYTES     Status: Abnormal   Collection Time   01/04/11 10:16 AM      Component Value Range Comment   Retic Ct Pct 6.6 (*) 0.4 - 3.1 (%)    RBC. 2.73 (*) 4.22 - 5.81 (MIL/uL)    Retic Count, Manual 180.2  19.0 - 186.0  (K/uL)   BASIC METABOLIC PANEL     Status: Abnormal   Collection Time   01/04/11 10:16 AM      Component Value Range Comment   Sodium 140  135 - 145 (mEq/L)    Potassium 2.9 (*) 3.5 - 5.1 (mEq/L)    Chloride 106  96 - 112 (mEq/L)    CO2 22  19 - 32 (mEq/L)    Glucose, Bld 86  70 - 99 (mg/dL)    BUN 6  6 - 23 (mg/dL)    Creatinine, Ser 1.61  0.50 - 1.35 (mg/dL)    Calcium 9.2  8.4 - 10.5 (mg/dL)    GFR calc non Af Amer >90  >90 (mL/min)    GFR calc Af Amer >90  >90 (mL/min)    Dg Chest 2 View  01/04/2011  *RADIOLOGY REPORT*  Clinical Data: Sickle cell crisis.  Chest pain.  CHEST - 2 VIEW 01/04/2011:  Comparison: Two-view chest x-ray and CTA chest [facility] 12/16/2010.  Two-view chest x-ray 11/07/2010 [facility].  Findings: Cardiac silhouette mildly enlarged but stable.  Hilar and mediastinal contours otherwise unremarkable.  Minimal linear scarring in the lower lobes, unchanged.  No new pulmonary parenchymal abnormalities.  Pulmonary vascularity normal.  No pleural effusions.  Visualized bony thorax intact.  IMPRESSION: Stable mild cardiomegaly without pulmonary edema.  Stable minimal scarring in the lower lobes.  No acute cardiopulmonary disease.  Original Report Authenticated By: Arnell Sieving, M.D.    Review of Systems  Constitutional: Positive for diaphoresis.  HENT: Positive for neck pain.   Eyes: Negative.   Respiratory: Negative.   Cardiovascular: Negative.   Gastrointestinal: Negative.   Genitourinary: Negative.   Musculoskeletal: Positive for myalgias, back pain and joint pain.  Skin: Negative.   Neurological: Positive for weakness.  Endo/Heme/Allergies: Negative.   Psychiatric/Behavioral: Positive for depression. Negative for suicidal ideas. The patient has insomnia.     Blood pressure 107/64, pulse 77, temperature 98.6 F (37 C), temperature source Oral, resp. rate 20, SpO2 99.00%. Physical Exam  Constitutional: He is oriented to person, place,  and time. He appears well-developed and well-nourished. He appears distressed.  HENT:  Head: Normocephalic and atraumatic.  Right Ear: External ear normal.  Left Ear: External ear normal.  Eyes: Conjunctivae and EOM are normal. Pupils are equal, round, and reactive to light.  Neck: Normal range of motion. Neck supple.  Cardiovascular: Normal rate and regular rhythm.   Respiratory: Effort normal and breath sounds normal.  GI: Soft. Bowel sounds are normal.  Musculoskeletal: He exhibits tenderness. He exhibits no edema.  Neurological: He is alert and oriented to person, place, and time. He has normal reflexes.  Skin: Skin is warm and dry.  Psychiatric: He has a normal mood and affect.     Assessment/Plan 1. Sickle Celll Pain crisis: Admit for IV pain control. Hydrate, consult Dr [name]. Continue with Hydroxyurea. 2. Anemia: H/H at Baseline. Continue to monitor 3.  Hypokalemia: Replete 4. Leucocytosis: Cause unclear. Probably due to Crisis.  Continue to monitor  [name],[name] 01/04/2011, 10:18 PM

## 2011-01-04 NOTE — ED Provider Notes (Signed)
  Physical Exam  BP 107/64  Pulse 77  Temp(Src) 98.6 F (37 C) (Oral)  Resp 20  SpO2 99%  Physical Exam  Constitutional: He is oriented to person, place, and time. He appears well-developed and well-nourished.  HENT:  Head: Normocephalic and atraumatic.  Eyes: Conjunctivae are normal. Pupils are equal, round, and reactive to light.  Neck: Normal range of motion. Neck supple.  Cardiovascular: Normal rate, regular rhythm and normal heart sounds.   Pulmonary/Chest: Effort normal and breath sounds normal.  Abdominal: Soft. Bowel sounds are normal.  Neurological: He is alert and oriented to person, place, and time.  Skin: Skin is warm and dry. No rash noted. No erythema. No pallor.  Psychiatric: He has a normal mood and affect. His behavior is normal.    ED Course  Procedures  MDM 9:11 PM Patient reassessed. Patient is a chronic pain patient to 2 sickle cell anemia. Has had multiple doses of Dilaudid as well as 50 mcg of fentanyl and chest x-ray was normal. Patient reports pain has not changed is still a 6 or 7/10. Reports pain feels like prior tonsils out to be admitted in the past. Will try to get patient admitted for pain management due  to  sickle cell crisis,      Thomasene Lot, [place] 01/04/11 2113  10:03 PM Spoke with Dr. Mikeal Hawthorne. He will come see the patient for admission. Patient will protein 6 to a regular bed.  Thomasene Lot, [place] 01/04/11 2203

## 2011-01-04 NOTE — ED Notes (Signed)
Pt given pain med and benadryl, resting at this time, watching TV.  Skin warm and dry, color appropriate.

## 2011-01-04 NOTE — ED Provider Notes (Signed)
Patient has sickle cell disease and is a frequent ER visitor. He states he was here last night however he left from the waiting room. Patient has started treatment in the ER and he states his pain is better but still there. Patient alert cooperative eating, and watching TV he does not appear to be in any distress  Medical screening examination/treatment/procedure(s) were conducted as a shared visit with non-physician practitioner(s) and myself.  I personally evaluated the patient during the encounter Devoria Albe, MD, Franz Dell, MD 01/04/11 [phone]

## 2011-01-04 NOTE — ED Notes (Signed)
Pt requesting more pain med.   Also given a coke.  Pt resting quietly watching TV

## 2011-01-04 NOTE — ED Notes (Signed)
D/C instructions reviewed, pt verbalized understanding, ambulatory, d/c'd home

## 2011-01-05 LAB — DIFFERENTIAL
Basophils Absolute: 0.2 10*3/uL — ABNORMAL HIGH (ref 0.0–0.1)
Basophils Relative: 1 % (ref 0–1)
Eosinophils Absolute: 0.4 10*3/uL (ref 0.0–0.7)
Lymphocytes Relative: 18 % (ref 12–46)
Monocytes Absolute: 2.4 10*3/uL — ABNORMAL HIGH (ref 0.1–1.0)
Neutro Abs: 12.3 10*3/uL — ABNORMAL HIGH (ref 1.7–7.7)
Neutrophils Relative %: 66 % (ref 43–77)

## 2011-01-05 LAB — CBC
MCH: 30 pg (ref 26.0–34.0)
MCHC: 33.3 g/dL (ref 30.0–36.0)
RDW: 18.1 % — ABNORMAL HIGH (ref 11.5–15.5)

## 2011-01-05 LAB — BASIC METABOLIC PANEL
BUN: 6 mg/dL (ref 6–23)
Creatinine, Ser: 0.56 mg/dL (ref 0.50–1.35)
GFR calc Af Amer: >90 mL/min (ref >90)
GFR calc non Af Amer: >90 mL/min (ref >90)
Glucose, Bld: 89 mg/dL (ref 70–99)

## 2011-01-05 MED ORDER — PROMETHAZINE HCL 25 MG RE SUPP: 12.5000 mg | RECTAL | Status: DC | PRN

## 2011-01-05 MED ORDER — HYDROXYUREA 500 MG PO CAPS
500.0000 mg | ORAL_CAPSULE | Freq: Two times a day (BID) | ORAL | Status: DC
  Administered 2011-01-05 – 2011-01-15 (×20): 500 mg via ORAL
  Filled 2011-01-05 (×24): qty 1

## 2011-01-05 MED ORDER — FOLIC ACID 1 MG PO TABS
1.0000 mg | ORAL_TABLET | Freq: Every day | ORAL | Status: DC
  Administered 2011-01-05 – 2011-01-15 (×11): 1 mg via ORAL
  Filled 2011-01-05 (×11): qty 1

## 2011-01-05 MED ORDER — LORAZEPAM 0.5 MG PO TABS
0.5000 mg | ORAL_TABLET | ORAL | Status: DC | PRN
  Administered 2011-01-06: 1 mg via ORAL
  Filled 2011-01-05: qty 2

## 2011-01-05 MED ORDER — OXYCODONE HCL 40 MG PO TB12
40.0000 mg | ORAL_TABLET | Freq: Two times a day (BID) | ORAL | Status: DC
  Administered 2011-01-05 – 2011-01-15 (×22): 40 mg via ORAL
  Filled 2011-01-05 (×22): qty 1

## 2011-01-05 MED ORDER — ENOXAPARIN SODIUM 40 MG/0.4ML ~~LOC~~ SOLN
40.0000 mg | Freq: Every day | SUBCUTANEOUS | Status: DC
  Administered 2011-01-05 – 2011-01-15 (×10): 40 mg via SUBCUTANEOUS
  Filled 2011-01-05 (×11): qty 0.4

## 2011-01-05 MED ORDER — PROMETHAZINE HCL 25 MG PO TABS: 25.0000 mg | ORAL_TABLET | Freq: Four times a day (QID) | ORAL | Status: DC | PRN

## 2011-01-05 MED ORDER — POTASSIUM CHLORIDE IN NACL 40-0.9 MEQ/L-% IV SOLN
INTRAVENOUS | Status: DC
  Administered 2011-01-05 (×3): via INTRAVENOUS
  Filled 2011-01-05 (×6): qty 1000

## 2011-01-05 MED ORDER — IBUPROFEN 800 MG PO TABS: 800.0000 mg | ORAL_TABLET | Freq: Three times a day (TID) | ORAL | Status: DC | PRN

## 2011-01-05 MED ORDER — OLANZAPINE 10 MG PO TABS: 10.0000 mg | ORAL_TABLET | Freq: Every evening | ORAL | Status: DC | PRN

## 2011-01-05 MED ORDER — HYDROMORPHONE HCL PF 1 MG/ML IJ SOLN
2.0000 mg | INTRAMUSCULAR | Status: DC | PRN
  Administered 2011-01-05 – 2011-01-14 (×15): 2 mg via INTRAVENOUS
  Filled 2011-01-05 (×17): qty 2

## 2011-01-05 MED ORDER — FOLIC ACID 1 MG PO TABS: 1.0000 mg | ORAL_TABLET | Freq: Every day | ORAL | Status: DC

## 2011-01-05 MED ORDER — ZOLPIDEM TARTRATE 10 MG PO TABS
10.0000 mg | ORAL_TABLET | Freq: Every evening | ORAL | Status: DC | PRN
  Administered 2011-01-05 – 2011-01-14 (×8): 10 mg via ORAL
  Filled 2011-01-05 (×10): qty 1

## 2011-01-05 MED ORDER — LORAZEPAM 2 MG/ML IJ SOLN
0.5000 mg | INTRAMUSCULAR | Status: DC | PRN
  Filled 2011-01-05: qty 1

## 2011-01-05 MED ORDER — PROMETHAZINE HCL 25 MG PO TABS: 12.5000 mg | ORAL_TABLET | ORAL | Status: DC | PRN

## 2011-01-05 NOTE — Plan of Care (Signed)
[name] is known to hospitalist service, admitted with sickle cell crisis. Discussed with Dr. Willey Blade, patient will be transferred to Dr. Diamantina Providence service. I will sign off.    Deondra Labrador, MD 01/05/2011, 8:05 AM

## 2011-01-05 NOTE — Progress Notes (Signed)
Subjective: Patient reports she's feeling better from yesterday. He describes having episodic nausea and vomiting associated with diarrhea or no great temperature. He had been exposed to a family member with similar symptoms. Patient was noted to be significantly hypokalemic when evaluated in the emergency room. States he has been taking his potassium supplements have her. He presently denies chest pains anteriorly. He still has bilateral thigh pain. He rates his pain as a 710.      Allergies  Allergen Reactions  . Morphine And Related Rash   Current Facility-Administered Medications  Medication Dose Route Frequency Provider Last Rate Last Dose  . 0.9 % NaCl with KCl 40 mEq / L  infusion   Intravenous Continuous [name] [name] 125 mL/hr at 01/05/11 2219    . diphenhydrAMINE (BENADRYL) injection 12.5 mg  12.5 mg Intravenous Q6H PRN Jamesetta Orleans Lawyer, PA-C   12.5 mg at 01/05/11 2017  . enoxaparin (LOVENOX) injection 40 mg  40 mg Subcutaneous Daily [name] [name]   40 mg at 01/05/11 1032  . folic acid (FOLVITE) tablet 1 mg  1 mg Oral Daily [name] [name]   1 mg at 01/05/11 1032  . HYDROmorphone (DILAUDID) injection 1 mg  1 mg Intravenous Q2H PRN Jamesetta Orleans Lawyer, PA-C   1 mg at 01/05/11 2011  . HYDROmorphone (DILAUDID) injection 2 mg  2 mg Intravenous Q2H PRN [name] [name]   2 mg at 01/05/11 2215  . hydroxyurea (HYDREA) capsule 500 mg  500 mg Oral BID [name] [name]   500 mg at 01/05/11 2222  . ibuprofen (ADVIL,MOTRIN) tablet 800 mg  800 mg Oral Q8H PRN [name] [name]      . LORazepam (ATIVAN) tablet 0.5-1 mg  0.5-1 mg Oral Q3H PRN [name] [name]       Or  . LORazepam (ATIVAN) injection 0.5-1 mg  0.5-1 mg Intravenous Q3H PRN [name] [name]      . OLANZapine (ZYPREXA) tablet 10 mg  10 mg Oral QHS PRN [name] [name]      . ondansetron (ZOFRAN) injection 4 mg  4 mg Intravenous Q6H PRN Jamesetta Orleans Lawyer, PA-C   4 mg at 01/05/11 1014  . oxyCODONE (OXYCONTIN) 12 hr tablet 40 mg  40 mg Oral BID [name] [name]   40 mg at  01/05/11 2223  . promethazine (PHENERGAN) tablet 12.5-25 mg  12.5-25 mg Oral Q4H PRN [name] [name]       Or  . promethazine (PHENERGAN) suppository 12.5-25 mg  12.5-25 mg Rectal Q4H PRN [name] [name]      . promethazine (PHENERGAN) tablet 25 mg  25 mg Oral Q6H PRN [name] [name]      . zolpidem (AMBIEN) tablet 10 mg  10 mg Oral QHS PRN [name] [name]   10 mg at 01/05/11 2216  . DISCONTD: folic acid (FOLVITE) tablet 1 mg  1 mg Oral Daily [name] [name]      . DISCONTD: HYDROmorphone (DILAUDID) injection 1 mg  1 mg Intravenous Q4H PRN Thomasene Lot, PA      . DISCONTD: ondansetron (ZOFRAN) injection 4 mg  4 mg Intravenous Q8H PRN Thomasene Lot, PA        Objective: Blood pressure 124/73, pulse 75, temperature 99.2 F (37.3 C), temperature source Oral, resp. rate 20, height 6' (1.829 m), weight 166 lb 3.6 oz (75.4 kg), SpO2 99.00%.  Well-developed well-nourished black male in no acute distress. HEENT:no sinus tenderness. NECK:no posterior cervical nodes. LUNGS:clear to auscultation. No vocal fremitus. No CVA tenderness. ZO:XWRUEA S1, S2 without S3. Left  greater than right costochondral wall tenderness. ABD:no epigastric tenderness. NWG:NFAOZHYQ Homans. No edema. NEURO:intact.  Lab results: Results for orders placed during the hospital encounter of 01/04/11 (from the past 48 hour(s))  CBC     Status: Abnormal   Collection Time   01/04/11 10:16 AM      Component Value Range Comment   WBC 24.4 (*) 4.0 - 10.5 (K/uL)    RBC 2.73 (*) 4.22 - 5.81 (MIL/uL)    Hemoglobin 8.2 (*) 13.0 - 17.0 (g/dL)    HCT 65.7 (*) 84.6 - 52.0 (%)    MCV 91.6  78.0 - 100.0 (fL)    MCH 30.0  26.0 - 34.0 (pg)    MCHC 32.8  30.0 - 36.0 (g/dL)    RDW 96.2 (*) 95.2 - 15.5 (%)    Platelets 632 (*) 150 - 400 (K/uL)   DIFFERENTIAL     Status: Abnormal   Collection Time   01/04/11 10:16 AM      Component Value Range Comment   Neutrophils Relative 75  43 - 77 (%)    Lymphocytes Relative 14  12 - 46 (%)    Monocytes  Relative 10  3 - 12 (%)    Eosinophils Relative 1  0 - 5 (%)    Basophils Relative 0  0 - 1 (%)    Neutro Abs 18.4 (*) 1.7 - 7.7 (K/uL)    Lymphs Abs 3.4  0.7 - 4.0 (K/uL)    Monocytes Absolute 2.4 (*) 0.1 - 1.0 (K/uL)    Eosinophils Absolute 0.2  0.0 - 0.7 (K/uL)    Basophils Absolute 0.0  0.0 - 0.1 (K/uL)    RBC Morphology POLYCHROMASIA PRESENT      Smear Review PLATELETS APPEAR INCREASED     RETICULOCYTES     Status: Abnormal   Collection Time   01/04/11 10:16 AM      Component Value Range Comment   Retic Ct Pct 6.6 (*) 0.4 - 3.1 (%)    RBC. 2.73 (*) 4.22 - 5.81 (MIL/uL)    Retic Count, Manual 180.2  19.0 - 186.0 (K/uL)   BASIC METABOLIC PANEL     Status: Abnormal   Collection Time   01/04/11 10:16 AM      Component Value Range Comment   Sodium 140  135 - 145 (mEq/L)    Potassium 2.9 (*) 3.5 - 5.1 (mEq/L)    Chloride 106  96 - 112 (mEq/L)    CO2 22  19 - 32 (mEq/L)    Glucose, Bld 86  70 - 99 (mg/dL)    BUN 6  6 - 23 (mg/dL)    Creatinine, Ser 8.41  0.50 - 1.35 (mg/dL)    Calcium 9.2  8.4 - 10.5 (mg/dL)    GFR calc non Af Amer >90  >90 (mL/min)    GFR calc Af Amer >90  >90 (mL/min)   BASIC METABOLIC PANEL     Status: Abnormal   Collection Time   01/05/11  3:05 AM      Component Value Range Comment   Sodium 139  135 - 145 (mEq/L)    Potassium 3.3 (*) 3.5 - 5.1 (mEq/L)    Chloride 110  96 - 112 (mEq/L)    CO2 16 (*) 19 - 32 (mEq/L)    Glucose, Bld 89  70 - 99 (mg/dL)    BUN 6  6 - 23 (mg/dL)    Creatinine, Ser 3.24  0.50 - 1.35 (mg/dL)  Calcium 8.9  8.4 - 10.5 (mg/dL)    GFR calc non Af Amer >90  >90 (mL/min)    GFR calc Af Amer >90  >90 (mL/min)   CBC     Status: Abnormal   Collection Time   01/05/11  3:05 AM      Component Value Range Comment   WBC 18.6 (*) 4.0 - 10.5 (K/uL)    RBC 2.23 (*) 4.22 - 5.81 (MIL/uL)    Hemoglobin 6.7 (*) 13.0 - 17.0 (g/dL)    HCT 40.9 (*) 81.1 - 52.0 (%)    MCV 90.1  78.0 - 100.0 (fL)    MCH 30.0  26.0 - 34.0 (pg)    MCHC 33.3  30.0  - 36.0 (g/dL)    RDW 91.4 (*) 78.2 - 15.5 (%)    Platelets    150 - 400 (K/uL)    Value: PLATELET CLUMPS NOTED ON SMEAR, COUNT APPEARS INCREASED  DIFFERENTIAL     Status: Abnormal   Collection Time   01/05/11  3:05 AM      Component Value Range Comment   Neutrophils Relative 66  43 - 77 (%)    Lymphocytes Relative 18  12 - 46 (%)    Monocytes Relative 13 (*) 3 - 12 (%)    Eosinophils Relative 2  0 - 5 (%)    Basophils Relative 1  0 - 1 (%)    Neutro Abs 12.3 (*) 1.7 - 7.7 (K/uL)    Lymphs Abs 3.3  0.7 - 4.0 (K/uL)    Monocytes Absolute 2.4 (*) 0.1 - 1.0 (K/uL)    Eosinophils Absolute 0.4  0.0 - 0.7 (K/uL)    Basophils Absolute 0.2 (*) 0.0 - 0.1 (K/uL)    RBC Morphology RARE NRBCs     MRSA PCR SCREENING     Status: Normal   Collection Time   01/05/11  5:54 AM      Component Value Range Comment   MRSA by PCR NEGATIVE  NEGATIVE    TYPE AND SCREEN     Status: Normal   Collection Time   01/05/11  7:10 AM      Component Value Range Comment   ABO/RH(D) O POS      Antibody Screen NEG      Sample Expiration 01/08/2011       Studies/Results: Dg Chest 2 View  01/04/2011  *RADIOLOGY REPORT*  Clinical Data: Sickle cell crisis.  Chest pain.  CHEST - 2 VIEW 01/04/2011:  Comparison: Two-view chest x-ray and CTA chest [facility] 12/16/2010.  Two-view chest x-ray 11/07/2010 [facility].  Findings: Cardiac silhouette mildly enlarged but stable.  Hilar and mediastinal contours otherwise unremarkable.  Minimal linear scarring in the lower lobes, unchanged.  No new pulmonary parenchymal abnormalities.  Pulmonary vascularity normal.  No pleural effusions.  Visualized bony thorax intact.  IMPRESSION: Stable mild cardiomegaly without pulmonary edema.  Stable minimal scarring in the lower lobes.  No acute cardiopulmonary disease.  Original Report Authenticated By: Arnell Sieving, M.D.    Patient Active Problem List  Diagnoses  . Sickle cell anemia  . Leukocytosis  . Chronic pain    . Hypokalemia    Impression: 1. Sickle cell crisis. 2. Probable viral gastroenteritis. 3. 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 4. Chronic pain syndrome.   Plan: 1. Continue present therapy. 2. Followup with [facility], CBC. 3. Continue to replace potassium stores. 4. Adjust his IV fluids.  [name], Sharonann Malbrough 01/05/2011 10:49 PM  ;

## 2011-01-06 ENCOUNTER — Inpatient Hospital Stay (HOSPITAL_COMMUNITY): Payer: Medicare Other

## 2011-01-06 LAB — CBC
HCT: 19.1 % — ABNORMAL LOW (ref 39.0–52.0)
MCH: 30.2 pg (ref 26.0–34.0)
MCHC: 33.5 g/dL (ref 30.0–36.0)
MCV: 90.1 fL (ref 78.0–100.0)
Platelets: 504 10*3/uL — ABNORMAL HIGH (ref 150–400)
RDW: 18 % — ABNORMAL HIGH (ref 11.5–15.5)
WBC: 20 10*3/uL — ABNORMAL HIGH (ref 4.0–10.5)

## 2011-01-06 LAB — COMPREHENSIVE METABOLIC PANEL
AST: 22 U/L (ref 0–37)
Albumin: 3.5 g/dL (ref 3.5–5.2)
BUN: 4 mg/dL — ABNORMAL LOW (ref 6–23)
Calcium: 8.2 mg/dL — ABNORMAL LOW (ref 8.4–10.5)
Creatinine, Ser: 0.58 mg/dL (ref 0.50–1.35)
Total Bilirubin: 4 mg/dL — ABNORMAL HIGH (ref 0.3–1.2)
Total Protein: 6.6 g/dL (ref 6.0–8.3)

## 2011-01-06 MED ORDER — SODIUM CHLORIDE 0.9 % IJ SOLN
10.0000 mL | INTRAMUSCULAR | Status: DC | PRN
  Administered 2011-01-06 – 2011-01-15 (×15): 10 mL

## 2011-01-06 MED ORDER — NALOXONE HCL 0.4 MG/ML IJ SOLN: 0.4000 mg | INTRAMUSCULAR | Status: DC | PRN

## 2011-01-06 MED ORDER — HYDROMORPHONE 0.3 MG/ML IV SOLN
INTRAVENOUS | Status: DC
  Administered 2011-01-06: 0.3 mg via INTRAVENOUS
  Administered 2011-01-06: 4.8 mg via INTRAVENOUS
  Administered 2011-01-06: 0.3 mg via INTRAVENOUS
  Filled 2011-01-06: qty 25

## 2011-01-06 MED ORDER — DIPHENHYDRAMINE HCL 50 MG/ML IJ SOLN
12.5000 mg | Freq: Four times a day (QID) | INTRAMUSCULAR | Status: DC | PRN
  Administered 2011-01-06: 12.5 mg via INTRAVENOUS
  Filled 2011-01-06 (×3): qty 1

## 2011-01-06 MED ORDER — HYDROMORPHONE 0.3 MG/ML IV SOLN
INTRAVENOUS | Status: DC
  Administered 2011-01-07: 2.47 mg via INTRAVENOUS
  Administered 2011-01-07: 6.49 mg via INTRAVENOUS
  Administered 2011-01-07: 12:00:00 via INTRAVENOUS
  Administered 2011-01-07: 25 mg via INTRAVENOUS
  Administered 2011-01-07: 4.26 mg via INTRAVENOUS
  Administered 2011-01-07: 4.99 mg via INTRAVENOUS
  Administered 2011-01-07: 0.3 mg via INTRAVENOUS
  Administered 2011-01-07: 4.16 mg via INTRAVENOUS
  Administered 2011-01-07: 3.51 mg via INTRAVENOUS
  Administered 2011-01-08: 3.49 mg via INTRAVENOUS
  Administered 2011-01-08: 7.5 mg via INTRAVENOUS
  Administered 2011-01-08: 8.02 mg via INTRAVENOUS
  Administered 2011-01-08: 17:00:00 via INTRAVENOUS
  Administered 2011-01-08: 3.66 mg via INTRAVENOUS
  Administered 2011-01-08: 4.99 mg via INTRAVENOUS
  Administered 2011-01-08: 5.12 mg via INTRAVENOUS
  Administered 2011-01-08: 11:00:00 via INTRAVENOUS
  Administered 2011-01-09: 6.99 mg via INTRAVENOUS
  Administered 2011-01-09: 21:00:00 via INTRAVENOUS
  Administered 2011-01-09: 2.18 mg via INTRAVENOUS
  Administered 2011-01-09: 2.99 mg via INTRAVENOUS
  Administered 2011-01-09: via INTRAVENOUS
  Administered 2011-01-09: 1.49 mg via INTRAVENOUS
  Administered 2011-01-10: 7.21 mg via INTRAVENOUS
  Administered 2011-01-10: 4.99 mg via INTRAVENOUS
  Administered 2011-01-10 (×2): via INTRAVENOUS
  Administered 2011-01-10: 5.99 mg via INTRAVENOUS
  Administered 2011-01-10: 3.65 mg via INTRAVENOUS
  Administered 2011-01-10: 1.49 mg via INTRAVENOUS
  Administered 2011-01-11: 6.44 mg via INTRAVENOUS
  Administered 2011-01-11 (×2): via INTRAVENOUS
  Administered 2011-01-11: 12.59 mg via INTRAVENOUS
  Administered 2011-01-11: 2.49 mg via INTRAVENOUS
  Administered 2011-01-11: 7.49 mg via INTRAVENOUS
  Administered 2011-01-11 (×2): via INTRAVENOUS
  Administered 2011-01-11: 7.65 mg via INTRAVENOUS
  Administered 2011-01-12: 5.65 mg via INTRAVENOUS
  Administered 2011-01-12 (×2): via INTRAVENOUS
  Administered 2011-01-12: 10.48 mg via INTRAVENOUS
  Administered 2011-01-12: 12:00:00 via INTRAVENOUS
  Administered 2011-01-12: 5.49 mg via INTRAVENOUS
  Administered 2011-01-13 (×2): via INTRAVENOUS
  Administered 2011-01-13: 2.3 mg via INTRAVENOUS
  Administered 2011-01-13: 5.98 mg via INTRAVENOUS
  Administered 2011-01-13: 21:00:00 via INTRAVENOUS
  Administered 2011-01-13: 6.47 mg via INTRAVENOUS
  Administered 2011-01-13: 4.47 mg via INTRAVENOUS
  Administered 2011-01-13: 3.49 mg via INTRAVENOUS
  Administered 2011-01-14: 3.19 mL via INTRAVENOUS
  Administered 2011-01-14: 19:00:00 via INTRAVENOUS
  Administered 2011-01-14: 4.86 mL via INTRAVENOUS
  Administered 2011-01-14 (×3): via INTRAVENOUS
  Administered 2011-01-14: 3.49 mg via INTRAVENOUS
  Administered 2011-01-14: 5.49 mL via INTRAVENOUS
  Administered 2011-01-14: 1.97 mL via INTRAVENOUS
  Administered 2011-01-14: 2.49 mL via INTRAVENOUS
  Administered 2011-01-14: 9 mL via INTRAVENOUS
  Filled 2011-01-06 (×27): qty 25

## 2011-01-06 MED ORDER — SODIUM CHLORIDE 0.9 % IJ SOLN: 9.0000 mL | INTRAMUSCULAR | Status: DC | PRN

## 2011-01-06 MED ORDER — POTASSIUM CHLORIDE 20 MEQ PO PACK
20.0000 meq | PACK | Freq: Three times a day (TID) | ORAL | Status: DC
  Filled 2011-01-06 (×3): qty 1

## 2011-01-06 MED ORDER — KCL IN DEXTROSE-NACL 20-5-0.2 MEQ/L-%-% IV SOLN
INTRAVENOUS | Status: DC
  Administered 2011-01-06: 11:00:00 via INTRAVENOUS
  Administered 2011-01-07: 75 mL/h via INTRAVENOUS
  Administered 2011-01-07 – 2011-01-12 (×7): via INTRAVENOUS
  Filled 2011-01-06 (×20): qty 1000

## 2011-01-06 MED ORDER — DIPHENHYDRAMINE HCL 12.5 MG/5ML PO ELIX: 12.5000 mg | ORAL_SOLUTION | Freq: Four times a day (QID) | ORAL | Status: DC | PRN

## 2011-01-06 MED ORDER — POTASSIUM CHLORIDE CRYS ER 20 MEQ PO TBCR
20.0000 meq | EXTENDED_RELEASE_TABLET | Freq: Three times a day (TID) | ORAL | Status: DC
  Administered 2011-01-06 – 2011-01-15 (×28): 20 meq via ORAL
  Filled 2011-01-06 (×32): qty 1

## 2011-01-06 MED ORDER — ONDANSETRON HCL 4 MG/2ML IJ SOLN
4.0000 mg | Freq: Four times a day (QID) | INTRAMUSCULAR | Status: DC | PRN
  Administered 2011-01-07 – 2011-01-14 (×17): 4 mg via INTRAVENOUS
  Filled 2011-01-06 (×3): qty 2

## 2011-01-06 NOTE — Progress Notes (Signed)
Subjective:  Patient continues to have bilateral chest wall pain. He rates the pain as a 7/10. There's been no significant cough. Pain is not pleuritic. He has had difficulty obtaining relief with his PCA pump. He has had difficulty with access issues earlier today. There are not able to obtain his blood work for further evaluation. He has a PICC line in at this time. No other new complaints. He has not received his incentive spirometer.      Allergies  Allergen Reactions  . Morphine And Related Rash   Current Facility-Administered Medications  Medication Dose Route Frequency Provider Last Rate Last Dose  . dextrose 5 % and 0.2 % NaCl with KCl 20 mEq infusion   Intravenous Continuous Willey Blade, MD 75 mL/hr at 01/06/11 1056    . diphenhydrAMINE (BENADRYL) injection 12.5 mg  12.5 mg Intravenous Q6H PRN Willey Blade, MD   12.5 mg at 01/06/11 1528   Or  . diphenhydrAMINE (BENADRYL) 12.5 MG/5ML elixir 12.5 mg  12.5 mg Oral Q6H PRN Willey Blade, MD      . diphenhydrAMINE (BENADRYL) injection 12.5 mg  12.5 mg Intravenous Q6H PRN Jamesetta Orleans Lawyer, PA-C   12.5 mg at 01/06/11 4696  . enoxaparin (LOVENOX) injection 40 mg  40 mg Subcutaneous Daily [name] [name]   40 mg at 01/06/11 1056  . folic acid (FOLVITE) tablet 1 mg  1 mg Oral Daily [name] [name]   1 mg at 01/06/11 1056  . HYDROmorphone (DILAUDID) injection 1 mg  1 mg Intravenous Q2H PRN Jamesetta Orleans Lawyer, PA-C   1 mg at 01/05/11 2011  . HYDROmorphone (DILAUDID) injection 2 mg  2 mg Intravenous Q2H PRN [name] [name]   2 mg at 01/06/11 1124  . HYDROmorphone (DILAUDID) PCA injection 0.3 mg/mL   Intravenous Q4H Willey Blade, MD   0.3 mg at 01/06/11 1911  . hydroxyurea (HYDREA) capsule 500 mg  500 mg Oral BID [name] [name]   500 mg at 01/06/11 1056  . ibuprofen (ADVIL,MOTRIN) tablet 800 mg  800 mg Oral Q8H PRN [name] [name]      . LORazepam (ATIVAN) tablet 0.5-1 mg  0.5-1 mg Oral Q3H PRN [name] [name]   1 mg at 01/06/11 0216   Or  . LORazepam (ATIVAN) injection  0.5-1 mg  0.5-1 mg Intravenous Q3H PRN [name] [name]      . naloxone (NARCAN) injection 0.4 mg  0.4 mg Intravenous PRN Willey Blade, MD       And  . sodium chloride 0.9 % injection 9 mL  9 mL Intravenous PRN Willey Blade, MD      . OLANZapine (ZYPREXA) tablet 10 mg  10 mg Oral QHS PRN [name] [name]      . ondansetron (ZOFRAN) injection 4 mg  4 mg Intravenous Q6H PRN Jamesetta Orleans Lawyer, PA-C   4 mg at 01/06/11 0014  . ondansetron (ZOFRAN) injection 4 mg  4 mg Intravenous Q6H PRN Willey Blade, MD      . oxyCODONE (OXYCONTIN) 12 hr tablet 40 mg  40 mg Oral BID [name] [name]   40 mg at 01/06/11 1056  . potassium chloride SA (K-DUR,KLOR-CON) CR tablet 20 mEq  20 mEq Oral TID Willey Blade, MD   20 mEq at 01/06/11 1704  . promethazine (PHENERGAN) tablet 12.5-25 mg  12.5-25 mg Oral Q4H PRN [name] [name]       Or  . promethazine (PHENERGAN) suppository 12.5-25 mg  12.5-25 mg Rectal Q4H PRN [name] [name]      .  promethazine (PHENERGAN) tablet 25 mg  25 mg Oral Q6H PRN [name] [name]      . sodium chloride 0.9 % injection 10 mL  10 mL Intracatheter PRN Willey Blade, MD   10 mL at 01/06/11 1951  . zolpidem (AMBIEN) tablet 10 mg  10 mg Oral QHS PRN [name] [name]   10 mg at 01/05/11 2216  . DISCONTD: 0.9 % NaCl with KCl 40 mEq / L  infusion   Intravenous Continuous [name] [name] 125 mL/hr at 01/05/11 2219    . DISCONTD: potassium chloride (KLOR-CON) packet 20 mEq  20 mEq Oral TID Willey Blade, MD        Objective: Blood pressure 114/75, pulse 79, temperature 98.6 F (37 C), temperature source Oral, resp. rate 16, height 6' (1.829 m), weight 166 lb 3.6 oz (75.4 kg), SpO2 98.00%.  Well-developed well-nourished black male in no acute distress. HEENT: No sinus tenderness. No posterior cervical nodes. No sclera icterus. NECK: No enlarged thyroid. LUNGS: Clear to auscultation. No vocal fremitus. He has bilateral costochondral tenderness to palpation. CV: Normal S1, S2 without S3. No ectopy. ABD: No epigastric tenderness. MSK: Negative  Homans no edema. NEURO: Intact.  Lab results: Results for orders placed during the hospital encounter of 01/04/11 (from the past 48 hour(s))  BASIC METABOLIC PANEL     Status: Abnormal   Collection Time   01/05/11  3:05 AM      Component Value Range Comment   Sodium 139  135 - 145 (mEq/L)    Potassium 3.3 (*) 3.5 - 5.1 (mEq/L)    Chloride 110  96 - 112 (mEq/L)    CO2 16 (*) 19 - 32 (mEq/L)    Glucose, Bld 89  70 - 99 (mg/dL)    BUN 6  6 - 23 (mg/dL)    Creatinine, Ser 1.61  0.50 - 1.35 (mg/dL)    Calcium 8.9  8.4 - 10.5 (mg/dL)    GFR calc non Af Amer >90  >90 (mL/min)    GFR calc Af Amer >90  >90 (mL/min)   CBC     Status: Abnormal   Collection Time   01/05/11  3:05 AM      Component Value Range Comment   WBC 18.6 (*) 4.0 - 10.5 (K/uL)    RBC 2.23 (*) 4.22 - 5.81 (MIL/uL)    Hemoglobin 6.7 (*) 13.0 - 17.0 (g/dL)    HCT 09.6 (*) 04.5 - 52.0 (%)    MCV 90.1  78.0 - 100.0 (fL)    MCH 30.0  26.0 - 34.0 (pg)    MCHC 33.3  30.0 - 36.0 (g/dL)    RDW 40.9 (*) 81.1 - 15.5 (%)    Platelets    150 - 400 (K/uL)    Value: PLATELET CLUMPS NOTED ON SMEAR, COUNT APPEARS INCREASED  DIFFERENTIAL     Status: Abnormal   Collection Time   01/05/11  3:05 AM      Component Value Range Comment   Neutrophils Relative 66  43 - 77 (%)    Lymphocytes Relative 18  12 - 46 (%)    Monocytes Relative 13 (*) 3 - 12 (%)    Eosinophils Relative 2  0 - 5 (%)    Basophils Relative 1  0 - 1 (%)    Neutro Abs 12.3 (*) 1.7 - 7.7 (K/uL)    Lymphs Abs 3.3  0.7 - 4.0 (K/uL)    Monocytes Absolute 2.4 (*) 0.1 - 1.0 (K/uL)  Eosinophils Absolute 0.4  0.0 - 0.7 (K/uL)    Basophils Absolute 0.2 (*) 0.0 - 0.1 (K/uL)    RBC Morphology RARE NRBCs     MRSA PCR SCREENING     Status: Normal   Collection Time   01/05/11  5:54 AM      Component Value Range Comment   MRSA by PCR NEGATIVE  NEGATIVE    TYPE AND SCREEN     Status: Normal   Collection Time   01/05/11  7:10 AM      Component Value Range Comment   ABO/RH(D)  O POS      Antibody Screen NEG      Sample Expiration 01/08/2011     CBC     Status: Abnormal   Collection Time   01/06/11  6:30 PM      Component Value Range Comment   WBC 20.0 (*) 4.0 - 10.5 (K/uL)    RBC 2.12 (*) 4.22 - 5.81 (MIL/uL)    Hemoglobin 6.4 (*) 13.0 - 17.0 (g/dL) CRITICAL VALUE NOTED.  VALUE IS CONSISTENT WITH PREVIOUSLY REPORTED AND CALLED VALUE.   HCT 19.1 (*) 39.0 - 52.0 (%)    MCV 90.1  78.0 - 100.0 (fL)    MCH 30.2  26.0 - 34.0 (pg)    MCHC 33.5  30.0 - 36.0 (g/dL)    RDW 40.9 (*) 81.1 - 15.5 (%)    Platelets 504 (*) 150 - 400 (K/uL)   COMPREHENSIVE METABOLIC PANEL     Status: Abnormal   Collection Time   01/06/11  6:30 PM      Component Value Range Comment   Sodium 130 (*) 135 - 145 (mEq/L) DELTA CHECK NOTED   Potassium 4.5  3.5 - 5.1 (mEq/L)    Chloride 102  96 - 112 (mEq/L)    CO2 21  19 - 32 (mEq/L)    Glucose, Bld 311 (*) 70 - 99 (mg/dL)    BUN 4 (*) 6 - 23 (mg/dL)    Creatinine, Ser 9.14  0.50 - 1.35 (mg/dL)    Calcium 8.2 (*) 8.4 - 10.5 (mg/dL)    Total Protein 6.6  6.0 - 8.3 (g/dL)    Albumin 3.5  3.5 - 5.2 (g/dL)    AST 22  0 - 37 (U/L)    ALT 10  0 - 53 (U/L)    Alkaline Phosphatase 71  39 - 117 (U/L)    Total Bilirubin 4.0 (*) 0.3 - 1.2 (mg/dL)    GFR calc non Af Amer >90  >90 (mL/min)    GFR calc Af Amer >90  >90 (mL/min)     Studies/Results: Chest Portable 1 View Post Insertion To Confirm Placement As Interpreted By Radiologist  01/06/2011  *RADIOLOGY REPORT*  Clinical Data:  PICC line placement  PORTABLE CHEST - 1 VIEW  Comparison: January 04, 2011  Findings: The right upper extremity PICC line tip is at the cavoatrial junction.  No pneumothorax.  Cardiomegaly persists.  The mediastinum and pulmonary vasculature are within normal limits. Both lungs are clear.  IMPRESSION: PICC line tip at cavoatrial junction.  No pneumothorax.  Cardiomegaly.  Original Report Authenticated By: Brandon Melnick, M.D.    Patient Active Problem List  Diagnoses  .  Sickle cell anemia  . Leukocytosis  . Chronic pain  . Hypokalemia    Impression: 1. Sickle cell crisis. Rule out symptomatic anemia. 2. Costochondral pain secondary to 1. 3. Hypokalemia. This been secondary to a aldosterone defect. 4. Chronic  pain syndrome. 5. Poor venous access. PICC line placed.   Plan: 1. Adjust his PCA Dilaudid. 2. Encourage incentive spirometry use. 3. Followup CBC in [facility] in a.m. Further treatment as appropriate.   [name], Kalden Wanke 01/06/2011 9:31 PM

## 2011-01-07 LAB — CBC
HCT: 21.1 % — ABNORMAL LOW (ref 39.0–52.0)
MCHC: 33.6 g/dL (ref 30.0–36.0)
MCV: 90.9 fL (ref 78.0–100.0)
Platelets: 558 10*3/uL — ABNORMAL HIGH (ref 150–400)
RDW: 18.1 % — ABNORMAL HIGH (ref 11.5–15.5)
WBC: 20.3 10*3/uL — ABNORMAL HIGH (ref 4.0–10.5)

## 2011-01-07 LAB — COMPREHENSIVE METABOLIC PANEL
ALT: 10 U/L (ref 0–53)
Alkaline Phosphatase: 82 U/L (ref 39–117)
BUN: 4 mg/dL — ABNORMAL LOW (ref 6–23)
Chloride: 106 mEq/L (ref 96–112)
GFR calc Af Amer: >90 mL/min (ref >90)
Glucose, Bld: 86 mg/dL (ref 70–99)
Potassium: 3.8 mEq/L (ref 3.5–5.1)
Sodium: 137 mEq/L (ref 135–145)
Total Bilirubin: 4.6 mg/dL — ABNORMAL HIGH (ref 0.3–1.2)
Total Protein: 7.9 g/dL (ref 6.0–8.3)

## 2011-01-08 LAB — COMPREHENSIVE METABOLIC PANEL
AST: 19 U/L (ref 0–37)
Albumin: 4.3 g/dL (ref 3.5–5.2)
BUN: 6 mg/dL (ref 6–23)
Creatinine, Ser: 0.54 mg/dL (ref 0.50–1.35)
Potassium: 4.2 mEq/L (ref 3.5–5.1)
Total Protein: 7.9 g/dL (ref 6.0–8.3)

## 2011-01-08 LAB — CBC
HCT: 22.1 % — ABNORMAL LOW (ref 39.0–52.0)
MCH: 30.3 pg (ref 26.0–34.0)
MCV: 90.6 fL (ref 78.0–100.0)
RBC: 2.44 MIL/uL — ABNORMAL LOW (ref 4.22–5.81)
WBC: 18.8 10*3/uL — ABNORMAL HIGH (ref 4.0–10.5)

## 2011-01-08 IMAGING — CR DG CHEST 2V
2 series · 2 of 2 positions shown · non-contrast
Comparison: 01/21/2008

CLINICAL DATA: Sickle cell

CHEST - 2 VIEW

[w chest pa]
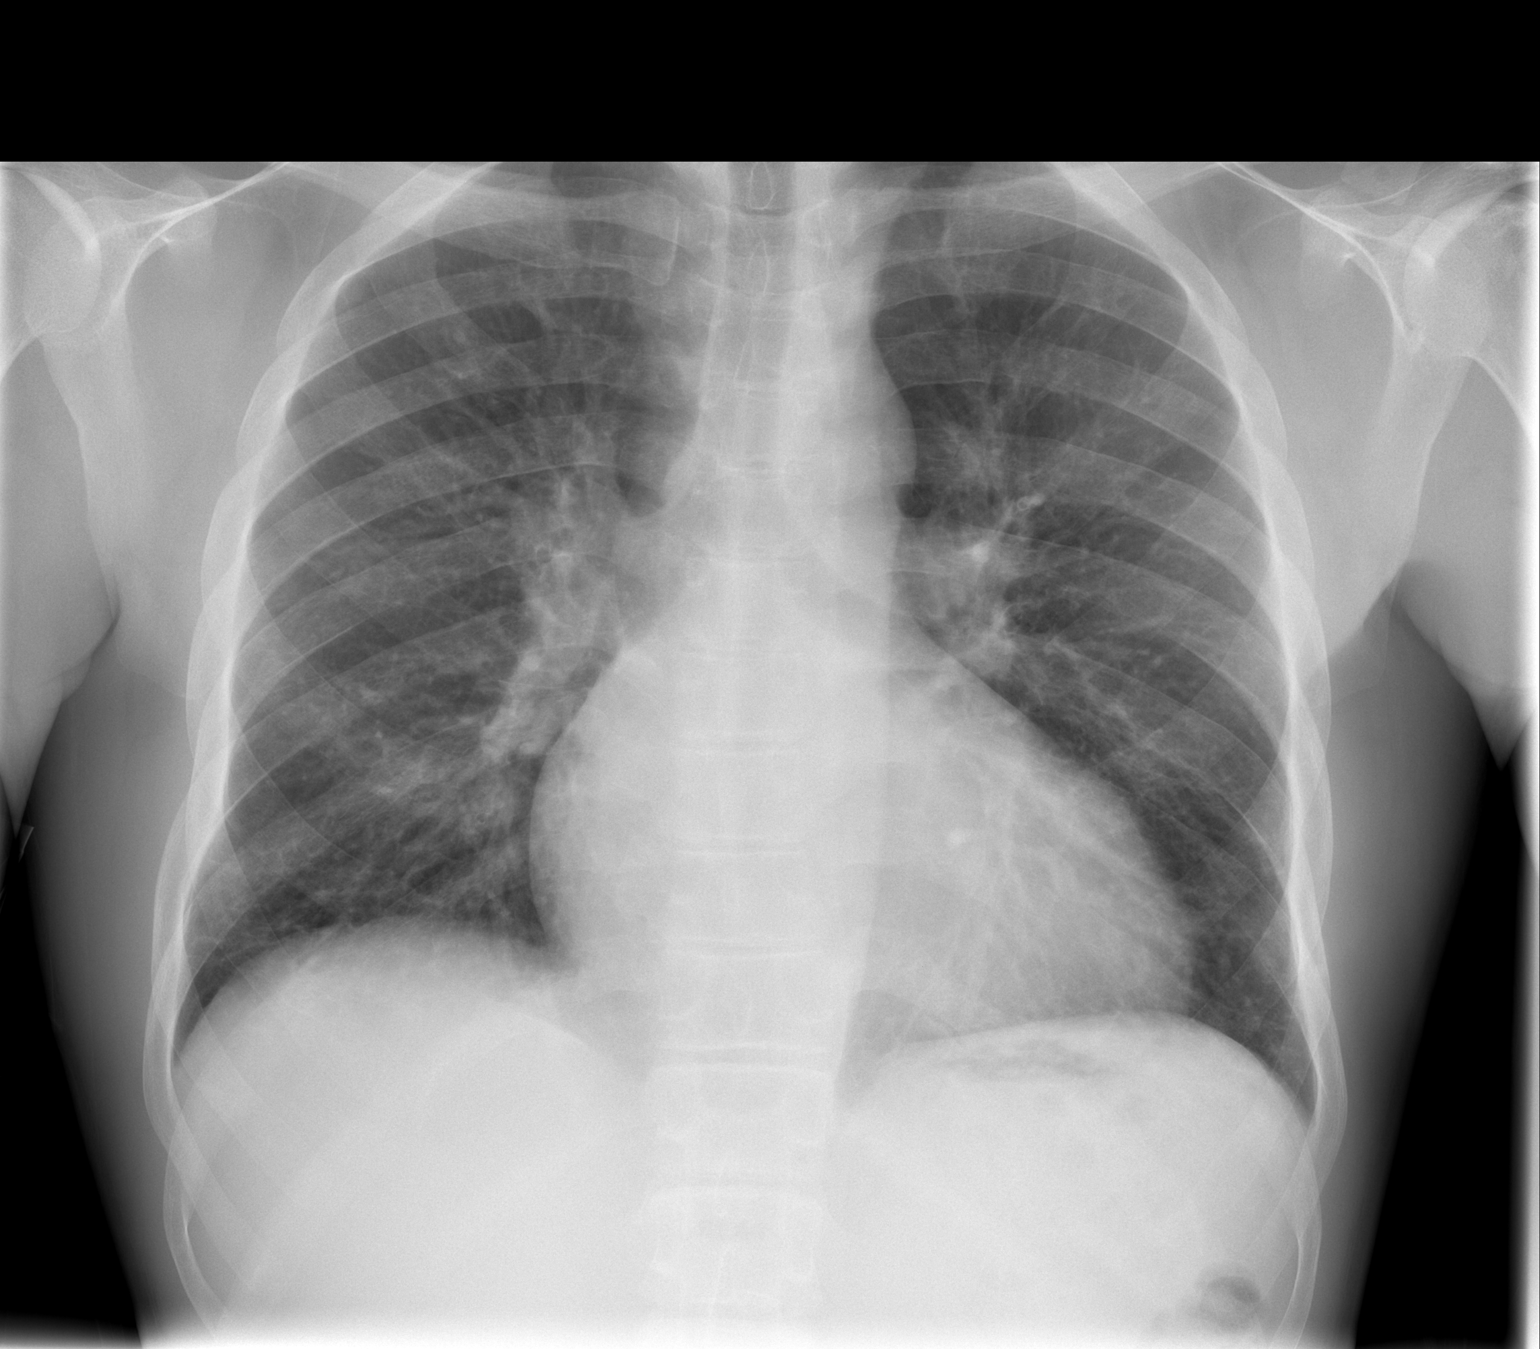

[w chest lat]
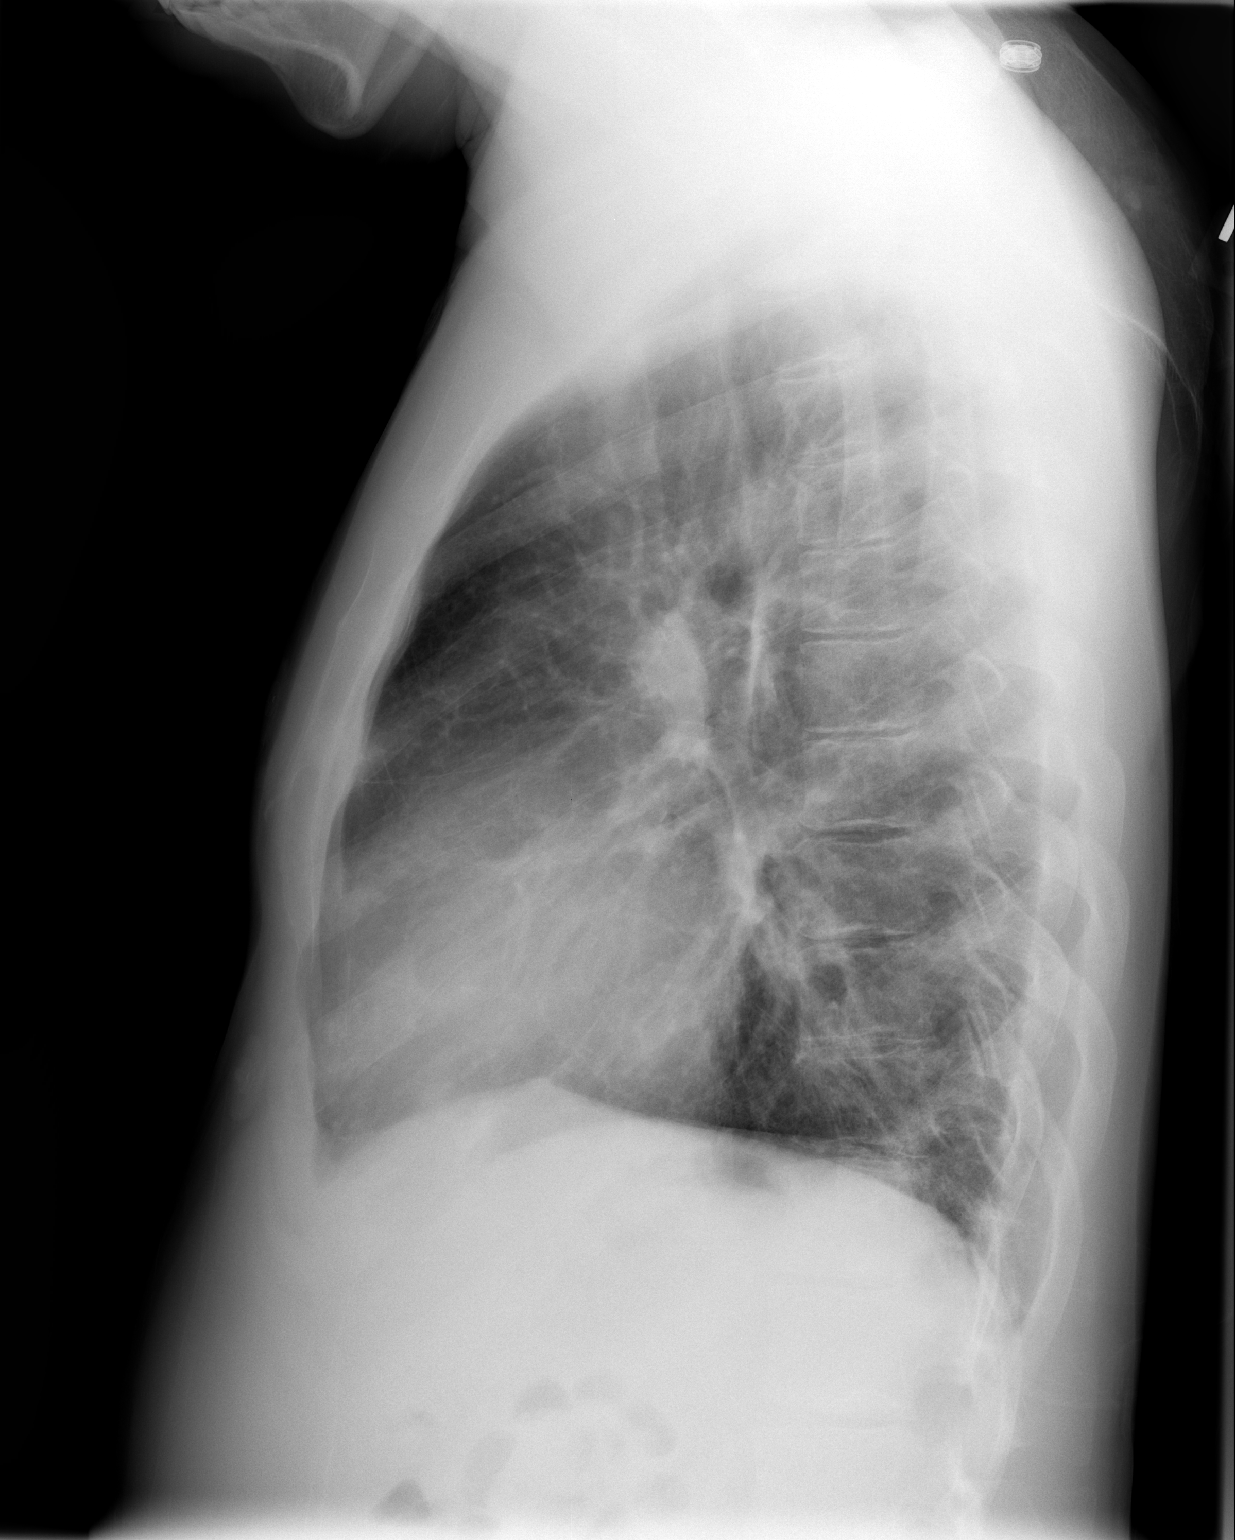

[2 of 2 positions shown; findings below may reference images not displayed]

FINDINGS: Cardiomegaly.  No CHF.  No infiltrate.
IMPRESSION: Cardiomegaly.

## 2011-01-08 MED ORDER — PANTOPRAZOLE SODIUM 40 MG PO TBEC
40.0000 mg | DELAYED_RELEASE_TABLET | Freq: Every day | ORAL | Status: DC
  Administered 2011-01-08 – 2011-01-14 (×7): 40 mg via ORAL
  Filled 2011-01-08 (×6): qty 1

## 2011-01-08 MED ORDER — DIPHENHYDRAMINE HCL 50 MG/ML IJ SOLN
12.5000 mg | INTRAMUSCULAR | Status: DC | PRN
  Administered 2011-01-08 (×2): 12.5 mg via INTRAVENOUS
  Filled 2011-01-08 (×2): qty 1

## 2011-01-08 MED ORDER — IBUPROFEN 800 MG PO TABS
800.0000 mg | ORAL_TABLET | Freq: Three times a day (TID) | ORAL | Status: DC
  Administered 2011-01-08 – 2011-01-15 (×21): 800 mg via ORAL
  Filled 2011-01-08 (×24): qty 1

## 2011-01-08 MED ORDER — DIPHENHYDRAMINE HCL 12.5 MG/5ML PO ELIX
12.5000 mg | ORAL_SOLUTION | Freq: Four times a day (QID) | ORAL | Status: DC | PRN
  Filled 2011-01-08: qty 10

## 2011-01-08 MED ORDER — HYDROMORPHONE 0.3 MG/ML IV SOLN
INTRAVENOUS | Status: AC
  Administered 2011-01-08: 7.5 mg via INTRAVENOUS
  Filled 2011-01-08: qty 25

## 2011-01-08 MED ORDER — DIPHENHYDRAMINE HCL 50 MG/ML IJ SOLN
25.0000 mg | INTRAMUSCULAR | Status: DC | PRN
  Administered 2011-01-08 – 2011-01-11 (×17): 25 mg via INTRAVENOUS
  Administered 2011-01-11: 19:00:00 via INTRAVENOUS
  Administered 2011-01-12 – 2011-01-13 (×6): 25 mg via INTRAVENOUS
  Administered 2011-01-13: 11:00:00 via INTRAVENOUS
  Administered 2011-01-13 – 2011-01-14 (×9): 25 mg via INTRAVENOUS
  Filled 2011-01-08 (×34): qty 1

## 2011-01-08 MED ORDER — DIPHENHYDRAMINE HCL 12.5 MG/5ML PO ELIX: 12.5000 mg | ORAL_SOLUTION | Freq: Four times a day (QID) | ORAL | Status: DC | PRN

## 2011-01-08 NOTE — Progress Notes (Signed)
Subjective:  Patient reports he is slowly feeling better. He continues to have bilateral rib pain which he rates as a 6/10 today. Pain is nonpleuritic. He has no anginal type pain. His present medication is helping. He denies abdominal pain nausea vomiting or leg pain.      Allergies  Allergen Reactions  . Morphine And Related Rash   Current Facility-Administered Medications  Medication Dose Route Frequency Provider Last Rate Last Dose  . dextrose 5 % and 0.2 % NaCl with KCl 20 mEq infusion   Intravenous Continuous Willey Blade, MD 75 mL/hr at 01/07/11 1413 75 mL/hr at 01/07/11 1413  . diphenhydrAMINE (BENADRYL) injection 12.5 mg  12.5 mg Intravenous Q6H PRN Willey Blade, MD   12.5 mg at 01/06/11 1528   Or  . diphenhydrAMINE (BENADRYL) 12.5 MG/5ML elixir 12.5 mg  12.5 mg Oral Q6H PRN Willey Blade, MD      . diphenhydrAMINE (BENADRYL) injection 12.5 mg  12.5 mg Intravenous Q6H PRN Jamesetta Orleans Lawyer, PA-C   12.5 mg at 01/07/11 2028  . enoxaparin (LOVENOX) injection 40 mg  40 mg Subcutaneous Daily [name] [name]   40 mg at 01/07/11 1124  . folic acid (FOLVITE) tablet 1 mg  1 mg Oral Daily [name] [name]   1 mg at 01/07/11 1125  . HYDROmorphone (DILAUDID) injection 1 mg  1 mg Intravenous Q2H PRN Jamesetta Orleans Lawyer, PA-C   1 mg at 01/05/11 2011  . HYDROmorphone (DILAUDID) injection 2 mg  2 mg Intravenous Q2H PRN [name] [name]   2 mg at 01/06/11 1124  . HYDROmorphone (DILAUDID) PCA injection 0.3 mg/mL   Intravenous Q4H Willey Blade, MD   4.26 mg at 01/07/11 1937  . hydroxyurea (HYDREA) capsule 500 mg  500 mg Oral BID [name] [name]   500 mg at 01/07/11 2248  . ibuprofen (ADVIL,MOTRIN) tablet 800 mg  800 mg Oral Q8H PRN [name] [name]      . LORazepam (ATIVAN) tablet 0.5-1 mg  0.5-1 mg Oral Q3H PRN [name] [name]   1 mg at 01/06/11 0216   Or  . LORazepam (ATIVAN) injection 0.5-1 mg  0.5-1 mg Intravenous Q3H PRN [name] [name]      . naloxone (NARCAN) injection 0.4 mg  0.4 mg Intravenous PRN Willey Blade, MD       And  .  sodium chloride 0.9 % injection 9 mL  9 mL Intravenous PRN Willey Blade, MD      . OLANZapine (ZYPREXA) tablet 10 mg  10 mg Oral QHS PRN [name] [name]      . ondansetron (ZOFRAN) injection 4 mg  4 mg Intravenous Q6H PRN Jamesetta Orleans Lawyer, PA-C   4 mg at 01/07/11 1419  . ondansetron (ZOFRAN) injection 4 mg  4 mg Intravenous Q6H PRN Willey Blade, MD   4 mg at 01/07/11 2028  . oxyCODONE (OXYCONTIN) 12 hr tablet 40 mg  40 mg Oral BID [name] [name]   40 mg at 01/07/11 2248  . potassium chloride SA (K-DUR,KLOR-CON) CR tablet 20 mEq  20 mEq Oral TID Willey Blade, MD   20 mEq at 01/07/11 2248  . promethazine (PHENERGAN) tablet 12.5-25 mg  12.5-25 mg Oral Q4H PRN [name] [name]       Or  . promethazine (PHENERGAN) suppository 12.5-25 mg  12.5-25 mg Rectal Q4H PRN [name] [name]      . promethazine (PHENERGAN) tablet 25 mg  25 mg Oral Q6H PRN [name] [name]      . sodium chloride 0.9 % injection 10  mL  10 mL Intracatheter PRN Willey Blade, MD   10 mL at 01/07/11 1614  . zolpidem (AMBIEN) tablet 10 mg  10 mg Oral QHS PRN [name] [name]   10 mg at 01/07/11 2248    Objective: Blood pressure 117/67, pulse 70, temperature 98.9 F (37.2 C), temperature source Oral, resp. rate 16, height 6' (1.829 m), weight 166 lb 3.6 oz (75.4 kg), SpO2 100.00%.  Well-developed well-nourished black male present in no acute distress. Using his PCA Dilaudid. HEENT:no sinus tenderness. No sclera icterus. NECK:no posterior cervical nodes. LUNGS:clear to auscultation. No vocal fremitus. No CVA tenderness. ZO:XWRUEA S1, S2 without S3. No rub appreciated. Bilateral chest wall tenderness along the costochondral region. VWU:JWJXBJYNWG tenderness. No masses appreciated. NFA:OZHYQMVH Homans no edema. NEURO:intact.  Lab results: Results for orders placed during the hospital encounter of 01/04/11 (from the past 48 hour(s))  CBC     Status: Abnormal   Collection Time   01/06/11  6:30 PM      Component Value Range Comment   WBC 20.0 (*) 4.0 - 10.5 (K/uL)      RBC 2.12 (*) 4.22 - 5.81 (MIL/uL)    Hemoglobin 6.4 (*) 13.0 - 17.0 (g/dL) CRITICAL VALUE NOTED.  VALUE IS CONSISTENT WITH PREVIOUSLY REPORTED AND CALLED VALUE.   HCT 19.1 (*) 39.0 - 52.0 (%)    MCV 90.1  78.0 - 100.0 (fL)    MCH 30.2  26.0 - 34.0 (pg)    MCHC 33.5  30.0 - 36.0 (g/dL)    RDW 84.6 (*) 96.2 - 15.5 (%)    Platelets 504 (*) 150 - 400 (K/uL)   COMPREHENSIVE METABOLIC PANEL     Status: Abnormal   Collection Time   01/06/11  6:30 PM      Component Value Range Comment   Sodium 130 (*) 135 - 145 (mEq/L) DELTA CHECK NOTED   Potassium 4.5  3.5 - 5.1 (mEq/L)    Chloride 102  96 - 112 (mEq/L)    CO2 21  19 - 32 (mEq/L)    Glucose, Bld 311 (*) 70 - 99 (mg/dL)    BUN 4 (*) 6 - 23 (mg/dL)    Creatinine, Ser 9.52  0.50 - 1.35 (mg/dL)    Calcium 8.2 (*) 8.4 - 10.5 (mg/dL)    Total Protein 6.6  6.0 - 8.3 (g/dL)    Albumin 3.5  3.5 - 5.2 (g/dL)    AST 22  0 - 37 (U/L)    ALT 10  0 - 53 (U/L)    Alkaline Phosphatase 71  39 - 117 (U/L)    Total Bilirubin 4.0 (*) 0.3 - 1.2 (mg/dL)    GFR calc non Af Amer >90  >90 (mL/min)    GFR calc Af Amer >90  >90 (mL/min)   COMPREHENSIVE METABOLIC PANEL     Status: Abnormal   Collection Time   01/07/11  6:00 AM      Component Value Range Comment   Sodium 137  135 - 145 (mEq/L)    Potassium 3.8  3.5 - 5.1 (mEq/L)    Chloride 106  96 - 112 (mEq/L)    CO2 21  19 - 32 (mEq/L)    Glucose, Bld 86  70 - 99 (mg/dL)    BUN 4 (*) 6 - 23 (mg/dL)    Creatinine, Ser 8.41  0.50 - 1.35 (mg/dL)    Calcium 9.0  8.4 - 10.5 (mg/dL)    Total Protein 7.9  6.0 - 8.3 (g/dL)  Albumin 4.0  3.5 - 5.2 (g/dL)    AST 22  0 - 37 (U/L)    ALT 10  0 - 53 (U/L)    Alkaline Phosphatase 82  39 - 117 (U/L)    Total Bilirubin 4.6 (*) 0.3 - 1.2 (mg/dL)    GFR calc non Af Amer >90  >90 (mL/min)    GFR calc Af Amer >90  >90 (mL/min)   CBC     Status: Abnormal   Collection Time   01/07/11  6:00 AM      Component Value Range Comment   WBC 20.3 (*) 4.0 - 10.5 (K/uL)    RBC  2.32 (*) 4.22 - 5.81 (MIL/uL)    Hemoglobin 7.1 (*) 13.0 - 17.0 (g/dL)    HCT 16.1 (*) 09.6 - 52.0 (%)    MCV 90.9  78.0 - 100.0 (fL)    MCH 30.6  26.0 - 34.0 (pg)    MCHC 33.6  30.0 - 36.0 (g/dL)    RDW 04.5 (*) 40.9 - 15.5 (%)    Platelets 558 (*) 150 - 400 (K/uL)     Studies/Results: Chest Portable 1 View Post Insertion To Confirm Placement As Interpreted By Radiologist  01/06/2011  *RADIOLOGY REPORT*  Clinical Data:  PICC line placement  PORTABLE CHEST - 1 VIEW  Comparison: January 04, 2011  Findings: The right upper extremity PICC line tip is at the cavoatrial junction.  No pneumothorax.  Cardiomegaly persists.  The mediastinum and pulmonary vasculature are within normal limits. Both lungs are clear.  IMPRESSION: PICC line tip at cavoatrial junction.  No pneumothorax.  Cardiomegaly.  Original Report Authenticated By: Brandon Melnick, M.D.    Patient Active Problem List  Diagnoses  . Sickle cell anemia  . Leukocytosis  . Chronic pain  . Hypokalemia    Impression: 1. Sickle cell anemia with ongoing crisis. 2. Chest wall pain secondary to #1. 3. Hypokalemia resolved. 4. Chronic pain syndrome. Plan: 1. Continue present dose of PCA Dilaudid. 2. Transfuse one unit packed RBCs in a.m. 3. Adjust Benadryl 2 q.4 hours for pruritus.   [name], Ranyia Witting 01/08/2011 12:44 AM

## 2011-01-08 NOTE — Progress Notes (Signed)
Subjective:  Patient continues to complain of bilateral chest wall pain. Pain is still rated as a 6/10. He's had no significant improvement. He has ibuprofen ordered but as a when necessary. Will adjust his to scheduled. There's been no new complaints. Denies increased cough. Denies abdominal pain or leg cramps. He has been having significant pruritus associated with medication. His of Benadryl at 12.5 does not control his symptoms. Patient is due to receive a blood transfusion as of today. No other new complaints.      Allergies  Allergen Reactions  . Morphine And Related Rash   Current Facility-Administered Medications  Medication Dose Route Frequency Provider Last Rate Last Dose  . dextrose 5 % and 0.2 % NaCl with KCl 20 mEq infusion   Intravenous Continuous Willey Blade, MD 75 mL/hr at 01/08/11 0345    . diphenhydrAMINE (BENADRYL) injection 25 mg  25 mg Intravenous Q4H PRN Willey Blade, MD       Or  . diphenhydrAMINE (BENADRYL) 12.5 MG/5ML elixir 12.5 mg  12.5 mg Oral Q6H PRN Willey Blade, MD      . enoxaparin (LOVENOX) injection 40 mg  40 mg Subcutaneous Daily [name] [name]   40 mg at 01/07/11 1124  . folic acid (FOLVITE) tablet 1 mg  1 mg Oral Daily [name] [name]   1 mg at 01/08/11 1041  . HYDROmorphone (DILAUDID) injection 1 mg  1 mg Intravenous Q2H PRN Jamesetta Orleans Lawyer, PA-C   1 mg at 01/05/11 2011  . HYDROmorphone (DILAUDID) injection 2 mg  2 mg Intravenous Q2H PRN [name] [name]   2 mg at 01/06/11 1124  . HYDROmorphone (DILAUDID) PCA injection 0.3 mg/mL   Intravenous Q4H Willey Blade, MD      . hydroxyurea (HYDREA) capsule 500 mg  500 mg Oral BID [name] [name]   500 mg at 01/08/11 1041  . ibuprofen (ADVIL,MOTRIN) tablet 800 mg  800 mg Oral TID Willey Blade, MD      . LORazepam (ATIVAN) tablet 0.5-1 mg  0.5-1 mg Oral Q3H PRN [name] [name]   1 mg at 01/06/11 0216   Or  . LORazepam (ATIVAN) injection 0.5-1 mg  0.5-1 mg Intravenous Q3H PRN [name] [name]      . naloxone (NARCAN) injection 0.4 mg  0.4 mg  Intravenous PRN Willey Blade, MD       And  . sodium chloride 0.9 % injection 9 mL  9 mL Intravenous PRN Willey Blade, MD      . OLANZapine (ZYPREXA) tablet 10 mg  10 mg Oral QHS PRN [name] [name]      . ondansetron (ZOFRAN) injection 4 mg  4 mg Intravenous Q6H PRN Jamesetta Orleans Lawyer, PA-C   4 mg at 01/08/11 5409  . ondansetron (ZOFRAN) injection 4 mg  4 mg Intravenous Q6H PRN Willey Blade, MD   4 mg at 01/08/11 0906  . oxyCODONE (OXYCONTIN) 12 hr tablet 40 mg  40 mg Oral BID [name] [name]   40 mg at 01/08/11 1041  . potassium chloride SA (K-DUR,KLOR-CON) CR tablet 20 mEq  20 mEq Oral TID Willey Blade, MD   20 mEq at 01/08/11 1041  . promethazine (PHENERGAN) tablet 12.5-25 mg  12.5-25 mg Oral Q4H PRN [name] [name]       Or  . promethazine (PHENERGAN) suppository 12.5-25 mg  12.5-25 mg Rectal Q4H PRN [name] [name]      . promethazine (PHENERGAN) tablet 25 mg  25 mg Oral Q6H PRN [name] [name]      .  sodium chloride 0.9 % injection 10 mL  10 mL Intracatheter PRN Willey Blade, MD   10 mL at 01/08/11 0438  . zolpidem (AMBIEN) tablet 10 mg  10 mg Oral QHS PRN [name] [name]   10 mg at 01/07/11 2248  . DISCONTD: diphenhydrAMINE (BENADRYL) 12.5 MG/5ML elixir 12.5 mg  12.5 mg Oral Q6H PRN Willey Blade, MD      . DISCONTD: diphenhydrAMINE (BENADRYL) 12.5 MG/5ML elixir 12.5 mg  12.5 mg Oral Q6H PRN Willey Blade, MD      . DISCONTD: diphenhydrAMINE (BENADRYL) injection 12.5 mg  12.5 mg Intravenous Q6H PRN Jamesetta Orleans Lawyer, PA-C   12.5 mg at 01/08/11 0228  . DISCONTD: diphenhydrAMINE (BENADRYL) injection 12.5 mg  12.5 mg Intravenous Q6H PRN Willey Blade, MD   12.5 mg at 01/06/11 1528  . DISCONTD: diphenhydrAMINE (BENADRYL) injection 12.5 mg  12.5 mg Intravenous Q4H PRN Willey Blade, MD   12.5 mg at 01/08/11 1306  . DISCONTD: ibuprofen (ADVIL,MOTRIN) tablet 800 mg  800 mg Oral Q8H PRN [name] [name]        Objective: Blood pressure 99/62, pulse 73, temperature 98.3 F (36.8 C), temperature source Oral, resp. rate 16, height 6' (1.829 m),  weight 166 lb 3.6 oz (75.4 kg), SpO2 99.00%.  Well-developed well-nourished black male in no acute distress. HEENT: Sclera icterus. No sinus tenderness. NECK: No posterior cervical nodes. LUNGS: Clear to auscultation. No vocal fremitus. Bilateral costochondral tenderness to palpation. CV: Normal S1, S2 without S3. ABD: Minimal epigastric tenderness. MSK: Negative Homans no edema. NEURO: Intact.  Lab results: Results for orders placed during the hospital encounter of 01/04/11 (from the past 48 hour(s))  CBC     Status: Abnormal   Collection Time   01/06/11  6:30 PM      Component Value Range Comment   WBC 20.0 (*) 4.0 - 10.5 (K/uL)    RBC 2.12 (*) 4.22 - 5.81 (MIL/uL)    Hemoglobin 6.4 (*) 13.0 - 17.0 (g/dL) CRITICAL VALUE NOTED.  VALUE IS CONSISTENT WITH PREVIOUSLY REPORTED AND CALLED VALUE.   HCT 19.1 (*) 39.0 - 52.0 (%)    MCV 90.1  78.0 - 100.0 (fL)    MCH 30.2  26.0 - 34.0 (pg)    MCHC 33.5  30.0 - 36.0 (g/dL)    RDW 16.1 (*) 09.6 - 15.5 (%)    Platelets 504 (*) 150 - 400 (K/uL)   COMPREHENSIVE METABOLIC PANEL     Status: Abnormal   Collection Time   01/06/11  6:30 PM      Component Value Range Comment   Sodium 130 (*) 135 - 145 (mEq/L) DELTA CHECK NOTED   Potassium 4.5  3.5 - 5.1 (mEq/L)    Chloride 102  96 - 112 (mEq/L)    CO2 21  19 - 32 (mEq/L)    Glucose, Bld 311 (*) 70 - 99 (mg/dL)    BUN 4 (*) 6 - 23 (mg/dL)    Creatinine, Ser 0.45  0.50 - 1.35 (mg/dL)    Calcium 8.2 (*) 8.4 - 10.5 (mg/dL)    Total Protein 6.6  6.0 - 8.3 (g/dL)    Albumin 3.5  3.5 - 5.2 (g/dL)    AST 22  0 - 37 (U/L)    ALT 10  0 - 53 (U/L)    Alkaline Phosphatase 71  39 - 117 (U/L)    Total Bilirubin 4.0 (*) 0.3 - 1.2 (mg/dL)    GFR calc non Af Amer >90  >90 (mL/min)  GFR calc Af Amer >90  >90 (mL/min)   COMPREHENSIVE METABOLIC PANEL     Status: Abnormal   Collection Time   01/07/11  6:00 AM      Component Value Range Comment   Sodium 137  135 - 145 (mEq/L)    Potassium 3.8  3.5 - 5.1  (mEq/L)    Chloride 106  96 - 112 (mEq/L)    CO2 21  19 - 32 (mEq/L)    Glucose, Bld 86  70 - 99 (mg/dL)    BUN 4 (*) 6 - 23 (mg/dL)    Creatinine, Ser 4.09  0.50 - 1.35 (mg/dL)    Calcium 9.0  8.4 - 10.5 (mg/dL)    Total Protein 7.9  6.0 - 8.3 (g/dL)    Albumin 4.0  3.5 - 5.2 (g/dL)    AST 22  0 - 37 (U/L)    ALT 10  0 - 53 (U/L)    Alkaline Phosphatase 82  39 - 117 (U/L)    Total Bilirubin 4.6 (*) 0.3 - 1.2 (mg/dL)    GFR calc non Af Amer >90  >90 (mL/min)    GFR calc Af Amer >90  >90 (mL/min)   CBC     Status: Abnormal   Collection Time   01/07/11  6:00 AM      Component Value Range Comment   WBC 20.3 (*) 4.0 - 10.5 (K/uL)    RBC 2.32 (*) 4.22 - 5.81 (MIL/uL)    Hemoglobin 7.1 (*) 13.0 - 17.0 (g/dL)    HCT 81.1 (*) 91.4 - 52.0 (%)    MCV 90.9  78.0 - 100.0 (fL)    MCH 30.6  26.0 - 34.0 (pg)    MCHC 33.6  30.0 - 36.0 (g/dL)    RDW 78.2 (*) 95.6 - 15.5 (%)    Platelets 558 (*) 150 - 400 (K/uL)   COMPREHENSIVE METABOLIC PANEL     Status: Abnormal   Collection Time   01/08/11  5:00 AM      Component Value Range Comment   Sodium 135  135 - 145 (mEq/L)    Potassium 4.2  3.5 - 5.1 (mEq/L)    Chloride 102  96 - 112 (mEq/L)    CO2 23  19 - 32 (mEq/L)    Glucose, Bld 100 (*) 70 - 99 (mg/dL)    BUN 6  6 - 23 (mg/dL)    Creatinine, Ser 2.13  0.50 - 1.35 (mg/dL)    Calcium 9.2  8.4 - 10.5 (mg/dL)    Total Protein 7.9  6.0 - 8.3 (g/dL)    Albumin 4.3  3.5 - 5.2 (g/dL)    AST 19  0 - 37 (U/L)    ALT 10  0 - 53 (U/L)    Alkaline Phosphatase 78  39 - 117 (U/L)    Total Bilirubin 4.3 (*) 0.3 - 1.2 (mg/dL)    GFR calc non Af Amer >90  >90 (mL/min)    GFR calc Af Amer >90  >90 (mL/min)   CBC     Status: Abnormal   Collection Time   01/08/11  5:00 AM      Component Value Range Comment   WBC 18.8 (*) 4.0 - 10.5 (K/uL)    RBC 2.44 (*) 4.22 - 5.81 (MIL/uL)    Hemoglobin 7.4 (*) 13.0 - 17.0 (g/dL)    HCT 08.6 (*) 57.8 - 52.0 (%)    MCV 90.6  78.0 - 100.0 (fL)  MCH 30.3  26.0 - 34.0  (pg)    MCHC 33.5  30.0 - 36.0 (g/dL)    RDW 16.1 (*) 09.6 - 15.5 (%)    Platelets 581 (*) 150 - 400 (K/uL)   PREPARE RBC (CROSSMATCH)     Status: Normal   Collection Time   01/08/11  9:00 AM      Component Value Range Comment   Order Confirmation ORDER PROCESSED BY BLOOD BANK       Studies/Results: No results found.  Patient Active Problem List  Diagnoses  . Sickle cell anemia  . Leukocytosis  . Chronic pain  . Hypokalemia    Impression: 1. Sickle cell crisis. Ongoing active hemolysis continues. 2. Costochondral pain secondary 1. 3. Hypokalemia resolved. 4. Leukocytosis secondary to #1. Rule out occult infection.  Plan: 1. Transfusion of one unit packed RBCs today. 2. Adjust his Benadryl dose IV fluids pruritus. 3. Change Motrin to scheduled versus when necessary. He has not been getting as consistently. 4. Protonix 40 mg daily for GI protection. 5. Empiric antibiotics with leukocytosis and ongoing crisis.  [name], Laterrica Libman 01/08/2011 1:15 PM

## 2011-01-09 LAB — COMPREHENSIVE METABOLIC PANEL
Albumin: 4.1 g/dL (ref 3.5–5.2)
Alkaline Phosphatase: 78 U/L (ref 39–117)
BUN: 9 mg/dL (ref 6–23)
CO2: 22 mEq/L (ref 19–32)
Chloride: 105 mEq/L (ref 96–112)
Creatinine, Ser: 0.6 mg/dL (ref 0.50–1.35)
GFR calc non Af Amer: >90 mL/min (ref >90)
Glucose, Bld: 103 mg/dL — ABNORMAL HIGH (ref 70–99)
Potassium: 4.2 mEq/L (ref 3.5–5.1)
Total Bilirubin: 3.9 mg/dL — ABNORMAL HIGH (ref 0.3–1.2)

## 2011-01-09 LAB — CBC
MCH: 30.3 pg (ref 26.0–34.0)
MCV: 89.4 fL (ref 78.0–100.0)
Platelets: 558 10*3/uL — ABNORMAL HIGH (ref 150–400)
RDW: 18.1 % — ABNORMAL HIGH (ref 11.5–15.5)
WBC: 16.7 10*3/uL — ABNORMAL HIGH (ref 4.0–10.5)

## 2011-01-09 LAB — TYPE AND SCREEN
ABO/RH(D): O POS
Antibody Screen: NEGATIVE
Unit division: 0

## 2011-01-09 MED ORDER — HYDROMORPHONE 0.3 MG/ML IV SOLN
INTRAVENOUS | Status: AC
  Filled 2011-01-09: qty 25

## 2011-01-09 NOTE — Progress Notes (Signed)
Subjective:  Patient reports he's feeling better today than yesterday. Presently rates his pain as a 4/10. He notes however the pain in his right knee. There is no associated swelling. He denies any increased chest pains or shortness of breath. Appetite has been good.      Allergies  Allergen Reactions  . Morphine And Related Rash   Current Facility-Administered Medications  Medication Dose Route Frequency Provider Last Rate Last Dose  . dextrose 5 % and 0.2 % NaCl with KCl 20 mEq infusion   Intravenous Continuous Willey Blade, MD 75 mL/hr at 01/08/11 1718    . diphenhydrAMINE (BENADRYL) injection 25 mg  25 mg Intravenous Q4H PRN Willey Blade, MD   25 mg at 01/09/11 0825   Or  . diphenhydrAMINE (BENADRYL) 12.5 MG/5ML elixir 12.5 mg  12.5 mg Oral Q6H PRN Willey Blade, MD      . enoxaparin (LOVENOX) injection 40 mg  40 mg Subcutaneous Daily [name] [name]   40 mg at 01/07/11 1124  . folic acid (FOLVITE) tablet 1 mg  1 mg Oral Daily [name] [name]   1 mg at 01/09/11 1109  . HYDROmorphone (DILAUDID) injection 1 mg  1 mg Intravenous Q2H PRN Jamesetta Orleans Lawyer, PA-C   1 mg at 01/05/11 2011  . HYDROmorphone (DILAUDID) injection 2 mg  2 mg Intravenous Q2H PRN [name] [name]   2 mg at 01/06/11 1124  . HYDROmorphone (DILAUDID) PCA injection 0.3 mg/mL   Intravenous Q4H Willey Blade, MD   2.99 mg at 01/09/11 0503  . HYDROmorphone PCA 0.3 mg/mL (DILAUDID) 0.3 mg/mL infusion           . hydroxyurea (HYDREA) capsule 500 mg  500 mg Oral BID [name] [name]   500 mg at 01/08/11 2314  . ibuprofen (ADVIL,MOTRIN) tablet 800 mg  800 mg Oral TID Willey Blade, MD   800 mg at 01/09/11 1109  . LORazepam (ATIVAN) tablet 0.5-1 mg  0.5-1 mg Oral Q3H PRN [name] [name]   1 mg at 01/06/11 0216   Or  . LORazepam (ATIVAN) injection 0.5-1 mg  0.5-1 mg Intravenous Q3H PRN [name] [name]      . naloxone (NARCAN) injection 0.4 mg  0.4 mg Intravenous PRN Willey Blade, MD       And  . sodium chloride 0.9 % injection 9 mL  9 mL Intravenous PRN Willey Blade, MD       . OLANZapine (ZYPREXA) tablet 10 mg  10 mg Oral QHS PRN [name] [name]      . ondansetron (ZOFRAN) injection 4 mg  4 mg Intravenous Q6H PRN Jamesetta Orleans Lawyer, PA-C   4 mg at 01/08/11 1704  . ondansetron (ZOFRAN) injection 4 mg  4 mg Intravenous Q6H PRN Willey Blade, MD   4 mg at 01/09/11 0825  . oxyCODONE (OXYCONTIN) 12 hr tablet 40 mg  40 mg Oral BID [name] [name]   40 mg at 01/09/11 1110  . pantoprazole (PROTONIX) EC tablet 40 mg  40 mg Oral Q1200 Willey Blade, MD   40 mg at 01/09/11 1109  . potassium chloride SA (K-DUR,KLOR-CON) CR tablet 20 mEq  20 mEq Oral TID Willey Blade, MD   20 mEq at 01/09/11 1108  . promethazine (PHENERGAN) tablet 12.5-25 mg  12.5-25 mg Oral Q4H PRN [name] [name]       Or  . promethazine (PHENERGAN) suppository 12.5-25 mg  12.5-25 mg Rectal Q4H PRN [name] [name]      . promethazine (PHENERGAN) tablet 25 mg  25 mg  Oral Q6H PRN [name] [name]      . sodium chloride 0.9 % injection 10 mL  10 mL Intracatheter PRN Willey Blade, MD   10 mL at 01/08/11 0438  . zolpidem (AMBIEN) tablet 10 mg  10 mg Oral QHS PRN [name] [name]   10 mg at 01/07/11 2248  . DISCONTD: diphenhydrAMINE (BENADRYL) 12.5 MG/5ML elixir 12.5 mg  12.5 mg Oral Q6H PRN Willey Blade, MD      . DISCONTD: diphenhydrAMINE (BENADRYL) injection 12.5 mg  12.5 mg Intravenous Q6H PRN Jamesetta Orleans Lawyer, PA-C   12.5 mg at 01/08/11 0228  . DISCONTD: diphenhydrAMINE (BENADRYL) injection 12.5 mg  12.5 mg Intravenous Q4H PRN Willey Blade, MD   12.5 mg at 01/08/11 1306  . DISCONTD: ibuprofen (ADVIL,MOTRIN) tablet 800 mg  800 mg Oral Q8H PRN [name] [name]        Objective: Blood pressure 118/75, pulse 63, temperature 98 F (36.7 C), temperature source Oral, resp. rate 20, height 6' (1.829 m), weight 166 lb 3.6 oz (75.4 kg), SpO2 98.00%.  Well-developed well-nourished black male in no acute distress. HEENT: No sinus tenderness. Minimal sclera icterus. NECK: No posterior cervical nodes. LUNGS: Clear to auscultation. No CVA tenderness. CV: Normal  S1, S2 without S3. Decreased chest wall tenderness. ABD: No epigastric tenderness. MSK: Mild swelling in the right knee. No increased warmth. Negative meniscus sign. NEURO: Intact.  Lab results: Results for orders placed during the hospital encounter of 01/04/11 (from the past 48 hour(s))  COMPREHENSIVE METABOLIC PANEL     Status: Abnormal   Collection Time   01/08/11  5:00 AM      Component Value Range Comment   Sodium 135  135 - 145 (mEq/L)    Potassium 4.2  3.5 - 5.1 (mEq/L)    Chloride 102  96 - 112 (mEq/L)    CO2 23  19 - 32 (mEq/L)    Glucose, Bld 100 (*) 70 - 99 (mg/dL)    BUN 6  6 - 23 (mg/dL)    Creatinine, Ser 7.82  0.50 - 1.35 (mg/dL)    Calcium 9.2  8.4 - 10.5 (mg/dL)    Total Protein 7.9  6.0 - 8.3 (g/dL)    Albumin 4.3  3.5 - 5.2 (g/dL)    AST 19  0 - 37 (U/L)    ALT 10  0 - 53 (U/L)    Alkaline Phosphatase 78  39 - 117 (U/L)    Total Bilirubin 4.3 (*) 0.3 - 1.2 (mg/dL)    GFR calc non Af Amer >90  >90 (mL/min)    GFR calc Af Amer >90  >90 (mL/min)   CBC     Status: Abnormal   Collection Time   01/08/11  5:00 AM      Component Value Range Comment   WBC 18.8 (*) 4.0 - 10.5 (K/uL)    RBC 2.44 (*) 4.22 - 5.81 (MIL/uL)    Hemoglobin 7.4 (*) 13.0 - 17.0 (g/dL)    HCT 95.6 (*) 21.3 - 52.0 (%)    MCV 90.6  78.0 - 100.0 (fL)    MCH 30.3  26.0 - 34.0 (pg)    MCHC 33.5  30.0 - 36.0 (g/dL)    RDW 08.6 (*) 57.8 - 15.5 (%)    Platelets 581 (*) 150 - 400 (K/uL)   PREPARE RBC (CROSSMATCH)     Status: Normal   Collection Time   01/08/11  9:00 AM      Component Value Range Comment  Order Confirmation ORDER PROCESSED BY BLOOD BANK     COMPREHENSIVE METABOLIC PANEL     Status: Abnormal   Collection Time   01/09/11  5:00 AM      Component Value Range Comment   Sodium 136  135 - 145 (mEq/L)    Potassium 4.2  3.5 - 5.1 (mEq/L)    Chloride 105  96 - 112 (mEq/L)    CO2 22  19 - 32 (mEq/L)    Glucose, Bld 103 (*) 70 - 99 (mg/dL)    BUN 9  6 - 23 (mg/dL)    Creatinine, Ser  1.61  0.50 - 1.35 (mg/dL)    Calcium 8.9  8.4 - 10.5 (mg/dL)    Total Protein 7.6  6.0 - 8.3 (g/dL)    Albumin 4.1  3.5 - 5.2 (g/dL)    AST 18  0 - 37 (U/L)    ALT 11  0 - 53 (U/L)    Alkaline Phosphatase 78  39 - 117 (U/L)    Total Bilirubin 3.9 (*) 0.3 - 1.2 (mg/dL)    GFR calc non Af Amer >90  >90 (mL/min)    GFR calc Af Amer >90  >90 (mL/min)   CBC     Status: Abnormal   Collection Time   01/09/11  5:00 AM      Component Value Range Comment   WBC 16.7 (*) 4.0 - 10.5 (K/uL)    RBC 2.54 (*) 4.22 - 5.81 (MIL/uL)    Hemoglobin 7.7 (*) 13.0 - 17.0 (g/dL)    HCT 09.6 (*) 04.5 - 52.0 (%)    MCV 89.4  78.0 - 100.0 (fL)    MCH 30.3  26.0 - 34.0 (pg)    MCHC 33.9  30.0 - 36.0 (g/dL)    RDW 40.9 (*) 81.1 - 15.5 (%)    Platelets 558 (*) 150 - 400 (K/uL)     Studies/Results: No results found.  Patient Active Problem List  Diagnoses  . Sickle cell anemia  . Leukocytosis  . Chronic pain  . Hypokalemia    Impression: 1. Resolving sickle cell crisis. 2. Chest wall pain is improved. 3. 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 4. Hypokalemia, resolved.    Plan: 1. Continue his present therapy. 2. Followup CBC, ferritin level in a.m. 3. Progress toward discharge.   [name], Cambria Osten 01/09/2011 12:07 PM

## 2011-01-10 LAB — COMPREHENSIVE METABOLIC PANEL
ALT: 13 U/L (ref 0–53)
AST: 23 U/L (ref 0–37)
Albumin: 4.1 g/dL (ref 3.5–5.2)
Calcium: 9.1 mg/dL (ref 8.4–10.5)
Chloride: 109 mEq/L (ref 96–112)
Creatinine, Ser: 0.72 mg/dL (ref 0.50–1.35)
Sodium: 139 mEq/L (ref 135–145)

## 2011-01-10 LAB — CBC
Hemoglobin: 7.4 g/dL — ABNORMAL LOW (ref 13.0–17.0)
MCH: 30.3 pg (ref 26.0–34.0)
MCV: 90.2 fL (ref 78.0–100.0)
RBC: 2.44 MIL/uL — ABNORMAL LOW (ref 4.22–5.81)

## 2011-01-10 MED ORDER — HYDROMORPHONE 0.3 MG/ML IV SOLN
INTRAVENOUS | Status: AC
  Administered 2011-01-10: 4.99 mg
  Filled 2011-01-10: qty 25

## 2011-01-10 MED ORDER — HYDROMORPHONE 0.3 MG/ML IV SOLN
INTRAVENOUS | Status: AC
  Administered 2011-01-11: 03:00:00
  Filled 2011-01-10: qty 25

## 2011-01-10 NOTE — Progress Notes (Signed)
Subjective:  Patient reports he's feeling better. Continues to have right sided abdommal pain. No nausea or vomiting. Pain is 5-6/10. He is not able to manage his pain at home yet.     Allergies  Allergen Reactions  . Morphine And Related Rash   Current Facility-Administered Medications  Medication Dose Route Frequency Provider Last Rate Last Dose  . dextrose 5 % and 0.2 % NaCl with KCl 20 mEq infusion   Intravenous Continuous Willey Blade, MD 75 mL/hr at 01/10/11 1030    . diphenhydrAMINE (BENADRYL) injection 25 mg  25 mg Intravenous Q4H PRN Willey Blade, MD   25 mg at 01/10/11 2211   Or  . diphenhydrAMINE (BENADRYL) 12.5 MG/5ML elixir 12.5 mg  12.5 mg Oral Q6H PRN Willey Blade, MD      . enoxaparin (LOVENOX) injection 40 mg  40 mg Subcutaneous Daily [name] [name]   40 mg at 01/10/11 0936  . folic acid (FOLVITE) tablet 1 mg  1 mg Oral Daily [name] [name]   1 mg at 01/10/11 0936  . HYDROmorphone (DILAUDID) injection 1 mg  1 mg Intravenous Q2H PRN Jamesetta Orleans Lawyer, PA-C   1 mg at 01/05/11 2011  . HYDROmorphone (DILAUDID) injection 2 mg  2 mg Intravenous Q2H PRN [name] [name]   2 mg at 01/06/11 1124  . HYDROmorphone (DILAUDID) PCA injection 0.3 mg/mL   Intravenous Q4H Willey Blade, MD      . HYDROmorphone PCA 0.3 mg/mL (DILAUDID) 0.3 mg/mL infusion           . HYDROmorphone PCA 0.3 mg/mL (DILAUDID) 0.3 mg/mL infusion           . HYDROmorphone PCA 0.3 mg/mL (DILAUDID) 0.3 mg/mL infusion        4.99 mg at 01/10/11 1345  . HYDROmorphone PCA 0.3 mg/mL (DILAUDID) 0.3 mg/mL infusion           . hydroxyurea (HYDREA) capsule 500 mg  500 mg Oral BID [name] [name]   500 mg at 01/10/11 0936  . ibuprofen (ADVIL,MOTRIN) tablet 800 mg  800 mg Oral TID Willey Blade, MD   800 mg at 01/10/11 1740  . LORazepam (ATIVAN) tablet 0.5-1 mg  0.5-1 mg Oral Q3H PRN [name] [name]   1 mg at 01/06/11 0216   Or  . LORazepam (ATIVAN) injection 0.5-1 mg  0.5-1 mg Intravenous Q3H PRN [name] [name]      . naloxone (NARCAN) injection 0.4 mg  0.4  mg Intravenous PRN Willey Blade, MD       And  . sodium chloride 0.9 % injection 9 mL  9 mL Intravenous PRN Willey Blade, MD      . OLANZapine (ZYPREXA) tablet 10 mg  10 mg Oral QHS PRN [name] [name]      . ondansetron (ZOFRAN) injection 4 mg  4 mg Intravenous Q6H PRN Jamesetta Orleans Lawyer, PA-C   4 mg at 01/08/11 1704  . ondansetron (ZOFRAN) injection 4 mg  4 mg Intravenous Q6H PRN Willey Blade, MD   4 mg at 01/10/11 2210  . oxyCODONE (OXYCONTIN) 12 hr tablet 40 mg  40 mg Oral BID [name] [name]   40 mg at 01/10/11 0936  . pantoprazole (PROTONIX) EC tablet 40 mg  40 mg Oral Q1200 Willey Blade, MD   40 mg at 01/10/11 1340  . potassium chloride SA (K-DUR,KLOR-CON) CR tablet 20 mEq  20 mEq Oral TID Willey Blade, MD   20 mEq at 01/10/11 1740  . promethazine (PHENERGAN) tablet 12.5-25 mg  12.5-25 mg Oral Q4H PRN [name] [name]       Or  . promethazine (PHENERGAN) suppository 12.5-25 mg  12.5-25 mg Rectal Q4H PRN [name] [name]      . promethazine (PHENERGAN) tablet 25 mg  25 mg Oral Q6H PRN [name] [name]      . sodium chloride 0.9 % injection 10 mL  10 mL Intracatheter PRN Willey Blade, MD   10 mL at 01/10/11 2059  . zolpidem (AMBIEN) tablet 10 mg  10 mg Oral QHS PRN [name] [name]   10 mg at 01/09/11 2139    Objective: Blood pressure 120/75, pulse 71, temperature 99 F (37.2 C), temperature source Oral, resp. rate 19, height 6' (1.829 m), weight 166 lb 3.6 oz (75.4 kg), SpO2 98.00%.  WDWN black male in no acute distress. HEENT:no sinus tenderness. No scleral icterus. NECK:no posterior cervical nodes. LUNGS:clear to auscultation. ZO:XWRUEA S1, S2 without S3.  VWU:JWJXB upper quadrant tenderness. MSK: negative homans. No edema. NEURO:intact.  Lab results: Results for orders placed during the hospital encounter of 01/04/11 (from the past 48 hour(s))  COMPREHENSIVE METABOLIC PANEL     Status: Abnormal   Collection Time   01/09/11  5:00 AM      Component Value Range Comment   Sodium 136  135 - 145 (mEq/L)    Potassium 4.2   3.5 - 5.1 (mEq/L)    Chloride 105  96 - 112 (mEq/L)    CO2 22  19 - 32 (mEq/L)    Glucose, Bld 103 (*) 70 - 99 (mg/dL)    BUN 9  6 - 23 (mg/dL)    Creatinine, Ser 1.47  0.50 - 1.35 (mg/dL)    Calcium 8.9  8.4 - 10.5 (mg/dL)    Total Protein 7.6  6.0 - 8.3 (g/dL)    Albumin 4.1  3.5 - 5.2 (g/dL)    AST 18  0 - 37 (U/L)    ALT 11  0 - 53 (U/L)    Alkaline Phosphatase 78  39 - 117 (U/L)    Total Bilirubin 3.9 (*) 0.3 - 1.2 (mg/dL)    GFR calc non Af Amer >90  >90 (mL/min)    GFR calc Af Amer >90  >90 (mL/min)   CBC     Status: Abnormal   Collection Time   01/09/11  5:00 AM      Component Value Range Comment   WBC 16.7 (*) 4.0 - 10.5 (K/uL)    RBC 2.54 (*) 4.22 - 5.81 (MIL/uL)    Hemoglobin 7.7 (*) 13.0 - 17.0 (g/dL)    HCT 82.9 (*) 56.2 - 52.0 (%)    MCV 89.4  78.0 - 100.0 (fL)    MCH 30.3  26.0 - 34.0 (pg)    MCHC 33.9  30.0 - 36.0 (g/dL)    RDW 13.0 (*) 86.5 - 15.5 (%)    Platelets 558 (*) 150 - 400 (K/uL)   COMPREHENSIVE METABOLIC PANEL     Status: Abnormal   Collection Time   01/10/11  5:45 AM      Component Value Range Comment   Sodium 139  135 - 145 (mEq/L)    Potassium 4.5  3.5 - 5.1 (mEq/L)    Chloride 109  96 - 112 (mEq/L)    CO2 21  19 - 32 (mEq/L)    Glucose, Bld 102 (*) 70 - 99 (mg/dL)    BUN 11  6 - 23 (mg/dL)    Creatinine, Ser 7.84  0.50 - 1.35 (  mg/dL)    Calcium 9.1  8.4 - 10.5 (mg/dL)    Total Protein 7.8  6.0 - 8.3 (g/dL)    Albumin 4.1  3.5 - 5.2 (g/dL)    AST 23  0 - 37 (U/L)    ALT 13  0 - 53 (U/L)    Alkaline Phosphatase 81  39 - 117 (U/L)    Total Bilirubin 3.4 (*) 0.3 - 1.2 (mg/dL)    GFR calc non Af Amer >90  >90 (mL/min)    GFR calc Af Amer >90  >90 (mL/min)   CBC     Status: Abnormal   Collection Time   01/10/11  5:45 AM      Component Value Range Comment   WBC 16.1 (*) 4.0 - 10.5 (K/uL)    RBC 2.44 (*) 4.22 - 5.81 (MIL/uL)    Hemoglobin 7.4 (*) 13.0 - 17.0 (g/dL)    HCT 11.9 (*) 14.7 - 52.0 (%)    MCV 90.2  78.0 - 100.0 (fL)    MCH 30.3   26.0 - 34.0 (pg)    MCHC 33.6  30.0 - 36.0 (g/dL)    RDW 82.9 (*) 56.2 - 15.5 (%)    Platelets 564 (*) 150 - 400 (K/uL)     Studies/Results: No results found.  Patient Active Problem List  Diagnoses  . Sickle cell anemia  . Leukocytosis  . Chronic pain  . Hypokalemia    Impression: 1. Resolving sickle cell crisis. 2. Right upper quadrant pain.   Plan: 1. Abdominal ultrasound in am. 2. Home soon if negative results. 3. Follow-up CBC, CMET in am.   [name], Brinn Westby 01/10/2011 10:45 PM

## 2011-01-11 ENCOUNTER — Inpatient Hospital Stay (HOSPITAL_COMMUNITY): Payer: Medicare Other

## 2011-01-11 LAB — CBC
Hemoglobin: 7 g/dL — ABNORMAL LOW (ref 13.0–17.0)
MCHC: 33.3 g/dL (ref 30.0–36.0)
RBC: 2.34 MIL/uL — ABNORMAL LOW (ref 4.22–5.81)
WBC: 15.9 10*3/uL — ABNORMAL HIGH (ref 4.0–10.5)

## 2011-01-11 LAB — COMPREHENSIVE METABOLIC PANEL
BUN: 10 mg/dL (ref 6–23)
CO2: 23 mEq/L (ref 19–32)
Calcium: 9.1 mg/dL (ref 8.4–10.5)
Creatinine, Ser: 0.72 mg/dL (ref 0.50–1.35)
GFR calc Af Amer: >90 mL/min (ref >90)
GFR calc non Af Amer: >90 mL/min (ref >90)
Glucose, Bld: 101 mg/dL — ABNORMAL HIGH (ref 70–99)

## 2011-01-11 MED ORDER — HYDROMORPHONE 0.3 MG/ML IV SOLN
INTRAVENOUS | Status: AC
  Filled 2011-01-11: qty 25

## 2011-01-11 NOTE — Progress Notes (Signed)
Subjective:  Patient reports he is gradually feeling better than before. He continues to feel fatigued but stronger. He continues to have bilateral side pain. He presently rates his pain as a 5/10. There is no associated nausea vomiting. He is slowly tapering down on his PCA Dilaudid pump. No other new complaints.      Allergies  Allergen Reactions  . Morphine And Related Rash   Current Facility-Administered Medications  Medication Dose Route Frequency Provider Last Rate Last Dose  . dextrose 5 % and 0.2 % NaCl with KCl 20 mEq infusion   Intravenous Continuous Willey Blade, MD 75 mL/hr at 01/11/11 1714    . diphenhydrAMINE (BENADRYL) injection 25 mg  25 mg Intravenous Q4H PRN Willey Blade, MD       Or  . diphenhydrAMINE (BENADRYL) 12.5 MG/5ML elixir 12.5 mg  12.5 mg Oral Q6H PRN Willey Blade, MD      . enoxaparin (LOVENOX) injection 40 mg  40 mg Subcutaneous Daily [name] [name]   40 mg at 01/11/11 0918  . folic acid (FOLVITE) tablet 1 mg  1 mg Oral Daily [name] [name]   1 mg at 01/11/11 0918  . HYDROmorphone (DILAUDID) injection 1 mg  1 mg Intravenous Q2H PRN Jamesetta Orleans Lawyer, PA-C   1 mg at 01/05/11 2011  . HYDROmorphone (DILAUDID) injection 2 mg  2 mg Intravenous Q2H PRN [name] [name]   2 mg at 01/06/11 1124  . HYDROmorphone (DILAUDID) PCA injection 0.3 mg/mL   Intravenous Q4H Willey Blade, MD      . HYDROmorphone PCA 0.3 mg/mL (DILAUDID) 0.3 mg/mL infusion        4.99 mg at 01/10/11 1345  . HYDROmorphone PCA 0.3 mg/mL (DILAUDID) 0.3 mg/mL infusion           . HYDROmorphone PCA 0.3 mg/mL (DILAUDID) 0.3 mg/mL infusion           . hydroxyurea (HYDREA) capsule 500 mg  500 mg Oral BID [name] [name]   500 mg at 01/11/11 0918  . ibuprofen (ADVIL,MOTRIN) tablet 800 mg  800 mg Oral TID Willey Blade, MD   800 mg at 01/11/11 1822  . LORazepam (ATIVAN) tablet 0.5-1 mg  0.5-1 mg Oral Q3H PRN [name] [name]   1 mg at 01/06/11 0216   Or  . LORazepam (ATIVAN) injection 0.5-1 mg  0.5-1 mg Intravenous Q3H PRN [name] [name]       . naloxone (NARCAN) injection 0.4 mg  0.4 mg Intravenous PRN Willey Blade, MD       And  . sodium chloride 0.9 % injection 9 mL  9 mL Intravenous PRN Willey Blade, MD      . OLANZapine (ZYPREXA) tablet 10 mg  10 mg Oral QHS PRN [name] [name]      . ondansetron (ZOFRAN) injection 4 mg  4 mg Intravenous Q6H PRN Jamesetta Orleans Lawyer, PA-C   4 mg at 01/11/11 1710  . ondansetron (ZOFRAN) injection 4 mg  4 mg Intravenous Q6H PRN Willey Blade, MD   4 mg at 01/11/11 0421  . oxyCODONE (OXYCONTIN) 12 hr tablet 40 mg  40 mg Oral BID [name] [name]   40 mg at 01/11/11 0918  . pantoprazole (PROTONIX) EC tablet 40 mg  40 mg Oral Q1200 Willey Blade, MD   40 mg at 01/11/11 1200  . potassium chloride SA (K-DUR,KLOR-CON) CR tablet 20 mEq  20 mEq Oral TID Willey Blade, MD   20 mEq at 01/11/11 1822  . promethazine (PHENERGAN) tablet 12.5-25 mg  12.5-25 mg Oral Q4H PRN [name] [name]       Or  . promethazine (PHENERGAN) suppository 12.5-25 mg  12.5-25 mg Rectal Q4H PRN [name] [name]      . promethazine (PHENERGAN) tablet 25 mg  25 mg Oral Q6H PRN [name] [name]      . sodium chloride 0.9 % injection 10 mL  10 mL Intracatheter PRN Willey Blade, MD   10 mL at 01/11/11 0551  . zolpidem (AMBIEN) tablet 10 mg  10 mg Oral QHS PRN [name] [name]   10 mg at 01/10/11 2252    Objective: Blood pressure 124/79, pulse 85, temperature 99 F (37.2 C), temperature source Oral, resp. rate 18, height 6' (1.829 m), weight 166 lb 3.6 oz (75.4 kg), SpO2 99.00%.  Well-developed well-nourished black male in no acute distress. HEENT: Minimal sclera icterus. No sinus tenderness. NECK: No posterior cervical nodes. LUNGS: Clear to auscultation. No vocal fremitus. No CVA tenderness. CV: Normal S1, S2 without S3. ABD: No masses or tenderness. MSK: Negative Homans. No edema. NEURO: Intact.  Lab results: Results for orders placed during the hospital encounter of 01/04/11 (from the past 48 hour(s))  COMPREHENSIVE METABOLIC PANEL     Status: Abnormal   Collection Time    01/10/11  5:45 AM      Component Value Range Comment   Sodium 139  135 - 145 (mEq/L)    Potassium 4.5  3.5 - 5.1 (mEq/L)    Chloride 109  96 - 112 (mEq/L)    CO2 21  19 - 32 (mEq/L)    Glucose, Bld 102 (*) 70 - 99 (mg/dL)    BUN 11  6 - 23 (mg/dL)    Creatinine, Ser 1.61  0.50 - 1.35 (mg/dL)    Calcium 9.1  8.4 - 10.5 (mg/dL)    Total Protein 7.8  6.0 - 8.3 (g/dL)    Albumin 4.1  3.5 - 5.2 (g/dL)    AST 23  0 - 37 (U/L)    ALT 13  0 - 53 (U/L)    Alkaline Phosphatase 81  39 - 117 (U/L)    Total Bilirubin 3.4 (*) 0.3 - 1.2 (mg/dL)    GFR calc non Af Amer >90  >90 (mL/min)    GFR calc Af Amer >90  >90 (mL/min)   CBC     Status: Abnormal   Collection Time   01/10/11  5:45 AM      Component Value Range Comment   WBC 16.1 (*) 4.0 - 10.5 (K/uL)    RBC 2.44 (*) 4.22 - 5.81 (MIL/uL)    Hemoglobin 7.4 (*) 13.0 - 17.0 (g/dL)    HCT 09.6 (*) 04.5 - 52.0 (%)    MCV 90.2  78.0 - 100.0 (fL)    MCH 30.3  26.0 - 34.0 (pg)    MCHC 33.6  30.0 - 36.0 (g/dL)    RDW 40.9 (*) 81.1 - 15.5 (%)    Platelets 564 (*) 150 - 400 (K/uL)   COMPREHENSIVE METABOLIC PANEL     Status: Abnormal   Collection Time   01/11/11  5:50 AM      Component Value Range Comment   Sodium 137  135 - 145 (mEq/L)    Potassium 4.4  3.5 - 5.1 (mEq/L)    Chloride 107  96 - 112 (mEq/L)    CO2 23  19 - 32 (mEq/L)    Glucose, Bld 101 (*) 70 - 99 (mg/dL)    BUN 10  6 -  23 (mg/dL)    Creatinine, Ser 4.09  0.50 - 1.35 (mg/dL)    Calcium 9.1  8.4 - 10.5 (mg/dL)    Total Protein 8.2  6.0 - 8.3 (g/dL)    Albumin 4.3  3.5 - 5.2 (g/dL)    AST 28  0 - 37 (U/L)    ALT 18  0 - 53 (U/L)    Alkaline Phosphatase 86  39 - 117 (U/L)    Total Bilirubin 3.4 (*) 0.3 - 1.2 (mg/dL)    GFR calc non Af Amer >90  >90 (mL/min)    GFR calc Af Amer >90  >90 (mL/min)   CBC     Status: Abnormal   Collection Time   01/11/11  5:50 AM      Component Value Range Comment   WBC 15.9 (*) 4.0 - 10.5 (K/uL)    RBC 2.34 (*) 4.22 - 5.81 (MIL/uL)     Hemoglobin 7.0 (*) 13.0 - 17.0 (g/dL)    HCT 81.1 (*) 91.4 - 52.0 (%)    MCV 89.7  78.0 - 100.0 (fL)    MCH 29.9  26.0 - 34.0 (pg)    MCHC 33.3  30.0 - 36.0 (g/dL)    RDW 78.2 (*) 95.6 - 15.5 (%)    Platelets 588 (*) 150 - 400 (K/uL)     Studies/Results: US Abdomen Complete  01/11/2011  *RADIOLOGY REPORT*  Clinical Data:  Right upper quadrant pain.  Sickle cell disease.  ABDOMINAL ULTRASOUND COMPLETE  Comparison:  Abdominal ultrasound 05/02/2012CT abdomen pelvis 06/01/2009  Findings:  Gallbladder:  Surgically absent.  Common Bile Duct:  Within normal limits in caliber. Measures 4.8 mm.  Liver: No focal mass lesion identified.  Within normal limits in parenchymal echogenicity.  IVC:  Appears normal.  Pancreas:  Although the pancreas is difficult to visualize in its entirety, no focal pancreatic abnormality is identified.  Spleen:  The spleen is normal in size, measuring 5.8 cm craniocaudal span.  The echotexture of the spleen is heterogeneous, with areas of increased echogenicity.  There are some associated areas of shadowing within the spleen.  Right kidney:  Normal in size and parenchymal echogenicity.  No evidence of suspicious mass or hydronephrosis.There is a 9 mm focal area of increased echogenicity within the upper pole of the right kidney, unchanged compared to ultrasound of 06/02/2010.This does not shadow, and is therefore not definitely a stone.  Left kidney:  Normal in size and parenchymal echogenicity.  No evidence of mass or hydronephrosis.  Abdominal Aorta:  No aneurysm identified.  An oval structure adjacent to the superior aspect of the pancreatic head measures 1.8 x 0.7 x 1.6 cm, most consistent with a periportal/peripancreatic lymph node, and is measures slightly smaller compared to ultrasound of 06/02/2010.  No ascites is identified.  IMPRESSION:  1.  No acute findings compared to prior studies. 2.  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 3.  Porta hepatis/peripancreatic lymph node is similar to slightly smaller compared to prior exam.  4.  Stable 9 mm focal echogenicity in the right kidney. Small stone not excluded, but not definite as it does not shadow.  Original Report Authenticated By: Britta Mccreedy, M.D.    Patient Active Problem List  Diagnoses  . Sickle cell anemia  . Leukocytosis  . Chronic pain  . Hypokalemia    Impression: 1. Slowly resolving sickle cell crisis. 2. Anemia secondary to 1. Hemoglobin has dropped  further. 3. Chest wall pain secondary to #1. 4. Hypokalemia resolved.   Plan: 1. Transfuse one unit of packed RBCs. 2. Followup serum ferritin CBC CMET. Chelation therapy as necessary. 3. Continue to taper his PCA Dilaudid. 4. Anticipate discharge 24-48 hours.   [name], Caleb Decock 01/11/2011 8:08 PM

## 2011-01-12 LAB — COMPREHENSIVE METABOLIC PANEL
ALT: 22 U/L (ref 0–53)
Alkaline Phosphatase: 88 U/L (ref 39–117)
CO2: 22 mEq/L (ref 19–32)
Chloride: 105 mEq/L (ref 96–112)
GFR calc Af Amer: >90 mL/min (ref >90)
Glucose, Bld: 87 mg/dL (ref 70–99)
Potassium: 4.4 mEq/L (ref 3.5–5.1)
Sodium: 137 mEq/L (ref 135–145)
Total Protein: 7.9 g/dL (ref 6.0–8.3)

## 2011-01-12 LAB — CBC
HCT: 19.2 % — ABNORMAL LOW (ref 39.0–52.0)
MCHC: 33.9 g/dL (ref 30.0–36.0)
RDW: 18 % — ABNORMAL HIGH (ref 11.5–15.5)

## 2011-01-12 MED ORDER — HYDROMORPHONE 0.3 MG/ML IV SOLN
INTRAVENOUS | Status: AC
  Filled 2011-01-12: qty 25

## 2011-01-12 MED ORDER — DOXYCYCLINE HYCLATE 100 MG PO TABS
100.0000 mg | ORAL_TABLET | Freq: Two times a day (BID) | ORAL | Status: DC
  Administered 2011-01-12 – 2011-01-15 (×7): 100 mg via ORAL
  Filled 2011-01-12 (×8): qty 1

## 2011-01-12 NOTE — Progress Notes (Signed)
Subjective:  Patient reports he continues to have pain in his left greater than right side. Pain is rated as a 6/10. He still feels more fatigue. This has not improved. Denies cough or shortness of breath. Appetite has been fair. He does note the changing weather affects pain as well.      Allergies  Allergen Reactions  . Morphine And Related Rash   Current Facility-Administered Medications  Medication Dose Route Frequency Provider Last Rate Last Dose  . dextrose 5 % and 0.2 % NaCl with KCl 20 mEq infusion   Intravenous Continuous Willey Blade, MD 75 mL/hr at 01/11/11 1714    . diphenhydrAMINE (BENADRYL) injection 25 mg  25 mg Intravenous Q4H PRN Willey Blade, MD   25 mg at 01/12/11 1217   Or  . diphenhydrAMINE (BENADRYL) 12.5 MG/5ML elixir 12.5 mg  12.5 mg Oral Q6H PRN Willey Blade, MD      . enoxaparin (LOVENOX) injection 40 mg  40 mg Subcutaneous Daily [name] [name]   40 mg at 01/12/11 1126  . folic acid (FOLVITE) tablet 1 mg  1 mg Oral Daily [name] [name]   1 mg at 01/12/11 1127  . HYDROmorphone (DILAUDID) injection 1 mg  1 mg Intravenous Q2H PRN Jamesetta Orleans Lawyer, PA-C   1 mg at 01/05/11 2011  . HYDROmorphone (DILAUDID) injection 2 mg  2 mg Intravenous Q2H PRN [name] [name]   2 mg at 01/06/11 1124  . HYDROmorphone (DILAUDID) PCA injection 0.3 mg/mL   Intravenous Q4H Willey Blade, MD      . HYDROmorphone PCA 0.3 mg/mL (DILAUDID) 0.3 mg/mL infusion           . HYDROmorphone PCA 0.3 mg/mL (DILAUDID) 0.3 mg/mL infusion           . hydroxyurea (HYDREA) capsule 500 mg  500 mg Oral BID [name] [name]   500 mg at 01/12/11 1127  . ibuprofen (ADVIL,MOTRIN) tablet 800 mg  800 mg Oral TID Willey Blade, MD   800 mg at 01/12/11 1127  . LORazepam (ATIVAN) tablet 0.5-1 mg  0.5-1 mg Oral Q3H PRN [name] [name]   1 mg at 01/06/11 0216   Or  . LORazepam (ATIVAN) injection 0.5-1 mg  0.5-1 mg Intravenous Q3H PRN [name] [name]      . naloxone (NARCAN) injection 0.4 mg  0.4 mg Intravenous PRN Willey Blade, MD       And  . sodium  chloride 0.9 % injection 9 mL  9 mL Intravenous PRN Willey Blade, MD      . OLANZapine (ZYPREXA) tablet 10 mg  10 mg Oral QHS PRN [name] [name]      . ondansetron (ZOFRAN) injection 4 mg  4 mg Intravenous Q6H PRN Jamesetta Orleans Lawyer, PA-C   4 mg at 01/11/11 1710  . ondansetron (ZOFRAN) injection 4 mg  4 mg Intravenous Q6H PRN Willey Blade, MD   4 mg at 01/12/11 1218  . oxyCODONE (OXYCONTIN) 12 hr tablet 40 mg  40 mg Oral BID [name] [name]   40 mg at 01/12/11 1127  . pantoprazole (PROTONIX) EC tablet 40 mg  40 mg Oral Q1200 Willey Blade, MD   40 mg at 01/11/11 1200  . potassium chloride SA (K-DUR,KLOR-CON) CR tablet 20 mEq  20 mEq Oral TID Willey Blade, MD   20 mEq at 01/12/11 1127  . promethazine (PHENERGAN) tablet 12.5-25 mg  12.5-25 mg Oral Q4H PRN [name] [name]       Or  . promethazine (PHENERGAN) suppository 12.5-25 mg  12.5-25 mg Rectal Q4H PRN [name] [name]      . promethazine (PHENERGAN) tablet 25 mg  25 mg Oral Q6H PRN [name] [name]      . sodium chloride 0.9 % injection 10 mL  10 mL Intracatheter PRN Willey Blade, MD   10 mL at 01/12/11 0944  . zolpidem (AMBIEN) tablet 10 mg  10 mg Oral QHS PRN [name] [name]   10 mg at 01/11/11 2220    Objective: Blood pressure 120/70, pulse 80, temperature 98.6 F (37 C), temperature source Oral, resp. rate 22, height 6' (1.829 m), weight 166 lb 3.6 oz (75.4 kg), SpO2 96.00%.  Well-developed well-nourished black male in no acute distress. HEENT: Positive sclera icterus. No sinus tenderness. NECK: No posterior cervical nodes. LUNGS: Clear to auscultation. No vocal fremitus. Tenderness to palpation in the lower left costal chondral margins versus right. CV: Normal S1, S2 without S3. ABD: No epigastric tenderness. MSK: Negative Homans. No edema. NEURO: Intact.  Lab results: Results for orders placed during the hospital encounter of 01/04/11 (from the past 48 hour(s))  COMPREHENSIVE METABOLIC PANEL     Status: Abnormal   Collection Time   01/11/11  5:50 AM       Component Value Range Comment   Sodium 137  135 - 145 (mEq/L)    Potassium 4.4  3.5 - 5.1 (mEq/L)    Chloride 107  96 - 112 (mEq/L)    CO2 23  19 - 32 (mEq/L)    Glucose, Bld 101 (*) 70 - 99 (mg/dL)    BUN 10  6 - 23 (mg/dL)    Creatinine, Ser 8.29  0.50 - 1.35 (mg/dL)    Calcium 9.1  8.4 - 10.5 (mg/dL)    Total Protein 8.2  6.0 - 8.3 (g/dL)    Albumin 4.3  3.5 - 5.2 (g/dL)    AST 28  0 - 37 (U/L)    ALT 18  0 - 53 (U/L)    Alkaline Phosphatase 86  39 - 117 (U/L)    Total Bilirubin 3.4 (*) 0.3 - 1.2 (mg/dL)    GFR calc non Af Amer >90  >90 (mL/min)    GFR calc Af Amer >90  >90 (mL/min)   CBC     Status: Abnormal   Collection Time   01/11/11  5:50 AM      Component Value Range Comment   WBC 15.9 (*) 4.0 - 10.5 (K/uL)    RBC 2.34 (*) 4.22 - 5.81 (MIL/uL)    Hemoglobin 7.0 (*) 13.0 - 17.0 (g/dL)    HCT 56.2 (*) 13.0 - 52.0 (%)    MCV 89.7  78.0 - 100.0 (fL)    MCH 29.9  26.0 - 34.0 (pg)    MCHC 33.3  30.0 - 36.0 (g/dL)    RDW 86.5 (*) 78.4 - 15.5 (%)    Platelets 588 (*) 150 - 400 (K/uL)   COMPREHENSIVE METABOLIC PANEL     Status: Abnormal   Collection Time   01/12/11  5:45 AM      Component Value Range Comment   Sodium 137  135 - 145 (mEq/L)    Potassium 4.4  3.5 - 5.1 (mEq/L)    Chloride 105  96 - 112 (mEq/L)    CO2 22  19 - 32 (mEq/L)    Glucose, Bld 87  70 - 99 (mg/dL)    BUN 11  6 - 23 (mg/dL)    Creatinine, Ser 6.96  0.50 - 1.35 (mg/dL)  Calcium 8.9  8.4 - 10.5 (mg/dL)    Total Protein 7.9  6.0 - 8.3 (g/dL)    Albumin 4.2  3.5 - 5.2 (g/dL)    AST 37  0 - 37 (U/L)    ALT 22  0 - 53 (U/L)    Alkaline Phosphatase 88  39 - 117 (U/L)    Total Bilirubin 3.5 (*) 0.3 - 1.2 (mg/dL)    GFR calc non Af Amer >90  >90 (mL/min)    GFR calc Af Amer >90  >90 (mL/min)   CBC     Status: Abnormal   Collection Time   01/12/11  5:45 AM      Component Value Range Comment   WBC 14.8 (*) 4.0 - 10.5 (K/uL)    RBC 2.11 (*) 4.22 - 5.81 (MIL/uL)    Hemoglobin 6.5 (*) 13.0 - 17.0  (g/dL)    HCT 09.6 (*) 04.5 - 52.0 (%)    MCV 91.0  78.0 - 100.0 (fL)    MCH 30.8  26.0 - 34.0 (pg)    MCHC 33.9  30.0 - 36.0 (g/dL)    RDW 40.9 (*) 81.1 - 15.5 (%)    Platelets 567 (*) 150 - 400 (K/uL)     Studies/Results: US Abdomen Complete  01/11/2011  *RADIOLOGY REPORT*  Clinical Data:  Right upper quadrant pain.  Sickle cell disease.  ABDOMINAL ULTRASOUND COMPLETE  Comparison:  Abdominal ultrasound 05/02/2012CT abdomen pelvis 06/01/2009  Findings:  Gallbladder:  Surgically absent.  Common Bile Duct:  Within normal limits in caliber. Measures 4.8 mm.  Liver: No focal mass lesion identified.  Within normal limits in parenchymal echogenicity.  IVC:  Appears normal.  Pancreas:  Although the pancreas is difficult to visualize in its entirety, no focal pancreatic abnormality is identified.  Spleen:  The spleen is normal in size, measuring 5.8 cm craniocaudal span.  The echotexture of the spleen is heterogeneous, with areas of increased echogenicity.  There are some associated areas of shadowing within the spleen.  Right kidney:  Normal in size and parenchymal echogenicity.  No evidence of suspicious mass or hydronephrosis.There is a 9 mm focal area of increased echogenicity within the upper pole of the right kidney, unchanged compared to ultrasound of 06/02/2010.This does not shadow, and is therefore not definitely a stone.  Left kidney:  Normal in size and parenchymal echogenicity.  No evidence of mass or hydronephrosis.  Abdominal Aorta:  No aneurysm identified.  An oval structure adjacent to the superior aspect of the pancreatic head measures 1.8 x 0.7 x 1.6 cm, most consistent with a periportal/peripancreatic lymph node, and is measures slightly smaller compared to ultrasound of 06/02/2010.  No ascites is identified.  IMPRESSION:  1.  No acute findings compared to prior studies. 2.  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 3.  Porta hepatis/peripancreatic lymph node is similar to slightly smaller compared to prior exam.  4.  Stable 9 mm focal echogenicity in the right kidney. Small stone not excluded, but not definite as it does not shadow.  Original Report Authenticated By: Britta Mccreedy, M.D.    Patient Active Problem List  Diagnoses  . Sickle cell anemia  . Leukocytosis  . Chronic pain  . Hypokalemia    Impression: 1. Sickle cell crisis. 2. Progressive anemia secondary to #1. Presently asymptomatic. 3. Chest wall pain secondary to #1. 4. Leukocytosis, persistent. Rule out occult infection. No definite foci found.  Probably secondary to #1.   Plan: 1. Transfuse one unit of packed RBCs. 2. Continues present therapy. 3. Followup CBC, CMET in a.m. 4. Empiric antibiotics, doxycycline 100 mg twice a day for 7 days.   [name], Aloma Boch 01/12/2011 1:10 PM

## 2011-01-13 LAB — PREPARE RBC (CROSSMATCH)

## 2011-01-13 LAB — CBC
HCT: 21 % — ABNORMAL LOW (ref 39.0–52.0)
MCH: 30.6 pg (ref 26.0–34.0)
MCH: 30.5 pg (ref 26.0–34.0)
MCHC: 33.8 g/dL (ref 30.0–36.0)
MCV: 90.1 fL (ref 78.0–100.0)
Platelets: 549 10*3/uL — ABNORMAL HIGH (ref 150–400)
RDW: 18.3 % — ABNORMAL HIGH (ref 11.5–15.5)
RDW: 18.1 % — ABNORMAL HIGH (ref 11.5–15.5)

## 2011-01-13 LAB — COMPREHENSIVE METABOLIC PANEL
AST: 40 U/L — ABNORMAL HIGH (ref 0–37)
Albumin: 4 g/dL (ref 3.5–5.2)
Alkaline Phosphatase: 85 U/L (ref 39–117)
Chloride: 106 mEq/L (ref 96–112)
Potassium: 4.2 mEq/L (ref 3.5–5.1)
Sodium: 136 mEq/L (ref 135–145)
Total Bilirubin: 3.5 mg/dL — ABNORMAL HIGH (ref 0.3–1.2)

## 2011-01-13 NOTE — Progress Notes (Signed)
Subjective:  Patient reports that he's feeling better overall compared to yesterday. He is however having more pain in the left shoulder. This is similar to previous pain he  had before. He rates his pain as a 6/10. He notes that his other pain is not as severe. He feels however that he can manage his pain at home. Review of systems otherwise unremarkable.      Allergies  Allergen Reactions  . Morphine And Related Rash   Current Facility-Administered Medications  Medication Dose Route Frequency Provider Last Rate Last Dose  . dextrose 5 % and 0.2 % NaCl with KCl 20 mEq infusion   Intravenous Continuous Willey Blade, MD 75 mL/hr at 01/12/11 2005    . diphenhydrAMINE (BENADRYL) injection 25 mg  25 mg Intravenous Q4H PRN Willey Blade, MD   25 mg at 01/13/11 1914   Or  . diphenhydrAMINE (BENADRYL) 12.5 MG/5ML elixir 12.5 mg  12.5 mg Oral Q6H PRN Willey Blade, MD      . doxycycline (VIBRA-TABS) tablet 100 mg  100 mg Oral Q12H Willey Blade, MD   100 mg at 01/13/11 1039  . enoxaparin (LOVENOX) injection 40 mg  40 mg Subcutaneous Daily [name] [name]   40 mg at 01/13/11 1039  . folic acid (FOLVITE) tablet 1 mg  1 mg Oral Daily [name] [name]   1 mg at 01/13/11 1039  . HYDROmorphone (DILAUDID) injection 1 mg  1 mg Intravenous Q2H PRN Jamesetta Orleans Lawyer, PA-C   1 mg at 01/05/11 2011  . HYDROmorphone (DILAUDID) injection 2 mg  2 mg Intravenous Q2H PRN [name] [name]   2 mg at 01/06/11 1124  . HYDROmorphone (DILAUDID) PCA injection 0.3 mg/mL   Intravenous Q4H Willey Blade, MD      . hydroxyurea (HYDREA) capsule 500 mg  500 mg Oral BID [name] [name]   500 mg at 01/13/11 1037  . ibuprofen (ADVIL,MOTRIN) tablet 800 mg  800 mg Oral TID Willey Blade, MD   800 mg at 01/13/11 1750  . LORazepam (ATIVAN) tablet 0.5-1 mg  0.5-1 mg Oral Q3H PRN [name] [name]   1 mg at 01/06/11 0216   Or  . LORazepam (ATIVAN) injection 0.5-1 mg  0.5-1 mg Intravenous Q3H PRN [name] [name]      . naloxone (NARCAN) injection 0.4 mg  0.4 mg Intravenous PRN Willey Blade, MD       And  . sodium chloride 0.9 % injection 9 mL  9 mL Intravenous PRN Willey Blade, MD      . OLANZapine (ZYPREXA) tablet 10 mg  10 mg Oral QHS PRN [name] [name]      . ondansetron (ZOFRAN) injection 4 mg  4 mg Intravenous Q6H PRN Jamesetta Orleans Lawyer, PA-C   4 mg at 01/11/11 1710  . ondansetron (ZOFRAN) injection 4 mg  4 mg Intravenous Q6H PRN Willey Blade, MD   4 mg at 01/13/11 1915  . oxyCODONE (OXYCONTIN) 12 hr tablet 40 mg  40 mg Oral BID [name] [name]   40 mg at 01/13/11 1043  . pantoprazole (PROTONIX) EC tablet 40 mg  40 mg Oral Q1200 Willey Blade, MD   40 mg at 01/13/11 1043  . potassium chloride SA (K-DUR,KLOR-CON) CR tablet 20 mEq  20 mEq Oral TID Willey Blade, MD   20 mEq at 01/13/11 1750  . promethazine (PHENERGAN) tablet 12.5-25 mg  12.5-25 mg Oral Q4H PRN [name] [name]       Or  . promethazine (PHENERGAN) suppository 12.5-25 mg  12.5-25  mg Rectal Q4H PRN [name] [name]      . promethazine (PHENERGAN) tablet 25 mg  25 mg Oral Q6H PRN [name] [name]      . sodium chloride 0.9 % injection 10 mL  10 mL Intracatheter PRN Willey Blade, MD   10 mL at 01/13/11 1211  . zolpidem (AMBIEN) tablet 10 mg  10 mg Oral QHS PRN [name] [name]   10 mg at 01/12/11 2117    Objective: Blood pressure 115/76, pulse 73, temperature 98.3 F (36.8 C), temperature source Oral, resp. rate 18, height 6' (1.829 m), weight 166 lb 3.6 oz (75.4 kg), SpO2 100.00%.  Well-developed well-nourished black male in no acute distress. HEENT: No sinus tenderness. Minimal sclera icterus. NECK: No posterior cervical nodes. LUNGS: Clear to auscultation. No vocal fremitus. CV: Normal S1, S2 without S3. ABD: No epigastric tenderness. MSK: Tenderness left a.c. joint. Decreased range of motion. No increased warmth or crepitation appreciated. No deltoid muscle tenderness. NEURO: Intact.  Lab results: Results for orders placed during the hospital encounter of 01/04/11 (from the past 48 hour(s))  COMPREHENSIVE METABOLIC PANEL     Status:  Abnormal   Collection Time   01/12/11  5:45 AM      Component Value Range Comment   Sodium 137  135 - 145 (mEq/L)    Potassium 4.4  3.5 - 5.1 (mEq/L)    Chloride 105  96 - 112 (mEq/L)    CO2 22  19 - 32 (mEq/L)    Glucose, Bld 87  70 - 99 (mg/dL)    BUN 11  6 - 23 (mg/dL)    Creatinine, Ser 1.61  0.50 - 1.35 (mg/dL)    Calcium 8.9  8.4 - 10.5 (mg/dL)    Total Protein 7.9  6.0 - 8.3 (g/dL)    Albumin 4.2  3.5 - 5.2 (g/dL)    AST 37  0 - 37 (U/L)    ALT 22  0 - 53 (U/L)    Alkaline Phosphatase 88  39 - 117 (U/L)    Total Bilirubin 3.5 (*) 0.3 - 1.2 (mg/dL)    GFR calc non Af Amer >90  >90 (mL/min)    GFR calc Af Amer >90  >90 (mL/min)   CBC     Status: Abnormal   Collection Time   01/12/11  5:45 AM      Component Value Range Comment   WBC 14.8 (*) 4.0 - 10.5 (K/uL)    RBC 2.11 (*) 4.22 - 5.81 (MIL/uL)    Hemoglobin 6.5 (*) 13.0 - 17.0 (g/dL)    HCT 09.6 (*) 04.5 - 52.0 (%)    MCV 91.0  78.0 - 100.0 (fL)    MCH 30.8  26.0 - 34.0 (pg)    MCHC 33.9  30.0 - 36.0 (g/dL)    RDW 40.9 (*) 81.1 - 15.5 (%)    Platelets 567 (*) 150 - 400 (K/uL)   PREPARE RBC (CROSSMATCH)     Status: Normal   Collection Time   01/12/11  2:00 PM      Component Value Range Comment   Order Confirmation ORDER PROCESSED BY BLOOD BANK     TYPE AND SCREEN     Status: Normal (Preliminary result)   Collection Time   01/12/11  3:00 PM      Component Value Range Comment   ABO/RH(D) O POS      Antibody Screen NEG      Sample Expiration 01/15/2011      Unit Number  [id number]      Blood Component Type RED CELLS,LR      Unit division 00      Status of Unit ISSUED,FINAL      Donor AG Type        Value: NEGATIVE FOR C ANTIGEN NEGATIVE FOR E ANTIGEN NEGATIVE FOR KELL ANTIGEN   Transfusion Status OK TO TRANSFUSE      Crossmatch Result COMPATIBLE      Unit Number [id number]      Blood Component Type RED CELLS,LR      Unit division 00      Status of Unit ISSUED      Donor AG Type        Value: NEGATIVE FOR KELL  ANTIGEN NEGATIVE FOR E ANTIGEN NEGATIVE FOR C ANTIGEN   Transfusion Status OK TO TRANSFUSE      Crossmatch Result COMPATIBLE     COMPREHENSIVE METABOLIC PANEL     Status: Abnormal   Collection Time   01/13/11  6:19 AM      Component Value Range Comment   Sodium 136  135 - 145 (mEq/L)    Potassium 4.2  3.5 - 5.1 (mEq/L)    Chloride 106  96 - 112 (mEq/L)    CO2 23  19 - 32 (mEq/L)    Glucose, Bld 91  70 - 99 (mg/dL)    BUN 10  6 - 23 (mg/dL)    Creatinine, Ser 7.82  0.50 - 1.35 (mg/dL)    Calcium 8.8  8.4 - 10.5 (mg/dL)    Total Protein 7.5  6.0 - 8.3 (g/dL)    Albumin 4.0  3.5 - 5.2 (g/dL)    AST 40 (*) 0 - 37 (U/L)    ALT 27  0 - 53 (U/L)    Alkaline Phosphatase 85  39 - 117 (U/L)    Total Bilirubin 3.5 (*) 0.3 - 1.2 (mg/dL)    GFR calc non Af Amer >90  >90 (mL/min)    GFR calc Af Amer >90  >90 (mL/min)   CBC     Status: Abnormal   Collection Time   01/13/11  6:19 AM      Component Value Range Comment   WBC 14.8 (*) 4.0 - 10.5 (K/uL)    RBC 2.32 (*) 4.22 - 5.81 (MIL/uL)    Hemoglobin 7.1 (*) 13.0 - 17.0 (g/dL)    HCT 95.6 (*) 21.3 - 52.0 (%)    MCV 90.1  78.0 - 100.0 (fL)    MCH 30.6  26.0 - 34.0 (pg)    MCHC 34.0  30.0 - 36.0 (g/dL)    RDW 08.6 (*) 57.8 - 15.5 (%)    Platelets 549 (*) 150 - 400 (K/uL)   CBC     Status: Abnormal   Collection Time   01/13/11 12:10 PM      Component Value Range Comment   WBC 14.9 (*) 4.0 - 10.5 (K/uL)    RBC 2.33 (*) 4.22 - 5.81 (MIL/uL)    Hemoglobin 7.1 (*) 13.0 - 17.0 (g/dL)    HCT 46.9 (*) 62.9 - 52.0 (%)    MCV 90.1  78.0 - 100.0 (fL)    MCH 30.5  26.0 - 34.0 (pg)    MCHC 33.8  30.0 - 36.0 (g/dL)    RDW 52.8 (*) 41.3 - 15.5 (%)    Platelets 567 (*) 150 - 400 (K/uL)   PREPARE RBC (CROSSMATCH)     Status: Normal   Collection Time   01/13/11  4:30 PM      Component Value Range Comment   Order Confirmation ORDER PROCESSED BY BLOOD BANK       Studies/Results: No results found.  Patient Active Problem List  Diagnoses  .  Sickle cell anemia  . Leukocytosis  . Chronic pain  . Hypokalemia    Impression: 1. Slowly resolving sickle cell crisis. 2. Decreased hemoglobin secondary to #1. 3. 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 Presently asymptomatic. 4. Hypokalemia. Resolved.   Plan: 1. Patient transfuse one unit of packed RBCs today. 2. We'll followup CBC CMET in a.m. 3. Possible discharge in a.m.   [name], Codee Tutson 01/13/2011 8:59 PM

## 2011-01-13 NOTE — Plan of Care (Signed)
Problem: Phase II Progression Outcomes Goal: Pulmonary hygiene as indicated Outcome: Progressing incentive spirometer use encouraged

## 2011-01-13 NOTE — Progress Notes (Signed)
Pt was given one unit of PRBCs at 1800.  Pre transfusion, transfusion, and post transfusion vital signs were stable. Blood was verified by Patricia Pesa.  Unit [id number] was started at 1800 and ended at 2000.  Patient tolerated transfusion.

## 2011-01-14 LAB — COMPREHENSIVE METABOLIC PANEL
ALT: 35 U/L (ref 0–53)
Calcium: 9.2 mg/dL (ref 8.4–10.5)
Creatinine, Ser: 0.75 mg/dL (ref 0.50–1.35)
GFR calc Af Amer: >90 mL/min (ref >90)
Glucose, Bld: 104 mg/dL — ABNORMAL HIGH (ref 70–99)
Sodium: 135 mEq/L (ref 135–145)
Total Protein: 7.2 g/dL (ref 6.0–8.3)

## 2011-01-14 LAB — TYPE AND SCREEN
ABO/RH(D): O POS
Unit division: 0

## 2011-01-14 LAB — CBC
HCT: 25.2 % — ABNORMAL LOW (ref 39.0–52.0)
Hemoglobin: 8.3 g/dL — ABNORMAL LOW (ref 13.0–17.0)
MCHC: 32.9 g/dL (ref 30.0–36.0)
RBC: 2.78 MIL/uL — ABNORMAL LOW (ref 4.22–5.81)

## 2011-01-14 MED ORDER — OLANZAPINE 10 MG PO TABS: 10.0000 mg | ORAL_TABLET | Freq: Every day | ORAL | Status: DC

## 2011-01-14 MED ORDER — HYDROMORPHONE HCL 4 MG PO TABS: 4.0000 mg | ORAL_TABLET | ORAL | Status: DC | PRN

## 2011-01-14 MED ORDER — HYDROMORPHONE 0.3 MG/ML IV SOLN
INTRAVENOUS | Status: AC
  Filled 2011-01-14: qty 25

## 2011-01-14 MED ORDER — OXYCODONE HCL 40 MG PO TB12: 40.0000 mg | ORAL_TABLET | Freq: Two times a day (BID) | ORAL | Status: DC

## 2011-01-14 MED ORDER — PROMETHAZINE HCL 25 MG PO TABS: 25.0000 mg | ORAL_TABLET | Freq: Four times a day (QID) | ORAL | Status: AC | PRN

## 2011-01-14 MED ORDER — ZOLPIDEM TARTRATE 10 MG PO TABS: 10.0000 mg | ORAL_TABLET | Freq: Every evening | ORAL | Status: DC | PRN

## 2011-01-14 NOTE — Progress Notes (Signed)
Subjective:  Patient reports he's feeling better overall today. He still has pain in the left shoulder which he rates at most as a 5/10. There's been no increase chest pains or shortness of breath. He's ambulated some in his room. Denies any abdominal pain. He still has used his PCA pump. Believes however by tomorrow he will be able to manage his pain better. No other new complaints.      Allergies  Allergen Reactions  . Morphine And Related Rash   Current Facility-Administered Medications  Medication Dose Route Frequency Provider Last Rate Last Dose  . dextrose 5 % and 0.2 % NaCl with KCl 20 mEq infusion   Intravenous Continuous Willey Blade, MD 75 mL/hr at 01/12/11 2005    . diphenhydrAMINE (BENADRYL) injection 25 mg  25 mg Intravenous Q4H PRN Willey Blade, MD   25 mg at 01/14/11 2031   Or  . diphenhydrAMINE (BENADRYL) 12.5 MG/5ML elixir 12.5 mg  12.5 mg Oral Q6H PRN Willey Blade, MD      . doxycycline (VIBRA-TABS) tablet 100 mg  100 mg Oral Q12H Willey Blade, MD   100 mg at 01/14/11 1100  . enoxaparin (LOVENOX) injection 40 mg  40 mg Subcutaneous Daily [name] [name]   40 mg at 01/14/11 1100  . folic acid (FOLVITE) tablet 1 mg  1 mg Oral Daily [name] [name]   1 mg at 01/14/11 1100  . HYDROmorphone (DILAUDID) injection 1 mg  1 mg Intravenous Q2H PRN Jamesetta Orleans Lawyer, PA-C   1 mg at 01/05/11 2011  . HYDROmorphone (DILAUDID) injection 2 mg  2 mg Intravenous Q2H PRN [name] [name]   2 mg at 01/14/11 1830  . HYDROmorphone (DILAUDID) PCA injection 0.3 mg/mL   Intravenous Q4H Willey Blade, MD   4.86 mL at 01/14/11 2034  . hydroxyurea (HYDREA) capsule 500 mg  500 mg Oral BID [name] [name]   500 mg at 01/14/11 1145  . ibuprofen (ADVIL,MOTRIN) tablet 800 mg  800 mg Oral TID Willey Blade, MD   800 mg at 01/14/11 1635  . LORazepam (ATIVAN) tablet 0.5-1 mg  0.5-1 mg Oral Q3H PRN [name] [name]   1 mg at 01/06/11 0216   Or  . LORazepam (ATIVAN) injection 0.5-1 mg  0.5-1 mg Intravenous Q3H PRN [name] [name]      . naloxone  (NARCAN) injection 0.4 mg  0.4 mg Intravenous PRN Willey Blade, MD       And  . sodium chloride 0.9 % injection 9 mL  9 mL Intravenous PRN Willey Blade, MD      . OLANZapine (ZYPREXA) tablet 10 mg  10 mg Oral QHS PRN [name] [name]      . ondansetron (ZOFRAN) injection 4 mg  4 mg Intravenous Q6H PRN Jamesetta Orleans Lawyer, PA-C   4 mg at 01/14/11 2031  . ondansetron (ZOFRAN) injection 4 mg  4 mg Intravenous Q6H PRN Willey Blade, MD   4 mg at 01/14/11 1203  . oxyCODONE (OXYCONTIN) 12 hr tablet 40 mg  40 mg Oral BID [name] [name]   40 mg at 01/14/11 1100  . pantoprazole (PROTONIX) EC tablet 40 mg  40 mg Oral Q1200 Willey Blade, MD   40 mg at 01/14/11 1143  . potassium chloride SA (K-DUR,KLOR-CON) CR tablet 20 mEq  20 mEq Oral TID Willey Blade, MD   20 mEq at 01/14/11 1635  . promethazine (PHENERGAN) tablet 12.5-25 mg  12.5-25 mg Oral Q4H PRN [name] [name]       Or  .  promethazine (PHENERGAN) suppository 12.5-25 mg  12.5-25 mg Rectal Q4H PRN [name] [name]      . promethazine (PHENERGAN) tablet 25 mg  25 mg Oral Q6H PRN [name] [name]      . sodium chloride 0.9 % injection 10 mL  10 mL Intracatheter PRN Willey Blade, MD   10 mL at 01/14/11 2102  . zolpidem (AMBIEN) tablet 10 mg  10 mg Oral QHS PRN [name] [name]   10 mg at 01/13/11 2234    Objective: Blood pressure 117/72, pulse 73, temperature 98.4 F (36.9 C), temperature source Oral, resp. rate 18, height 6' (1.829 m), weight 166 lb 3.6 oz (75.4 kg), SpO2 100.00%.  Well-developed well-nourished black male in no acute distress. HEENT: No sinus tenderness. Minimal sclera icterus. NECK: No posterior cervical nodes. LUNGS: Clear to auscultation. No vocal fremitus. CV: Normal S1, S2 without S3. ABD: No epigastric tenderness. MSK: Marked tenderness and left a.c. joint. No tenderness in right a.c. joint. No deltoid muscle tenderness. NEURO: Intact.  Lab results: Results for orders placed during the hospital encounter of 01/04/11 (from the past 48 hour(s))  COMPREHENSIVE  METABOLIC PANEL     Status: Abnormal   Collection Time   01/13/11  6:19 AM      Component Value Range Comment   Sodium 136  135 - 145 (mEq/L)    Potassium 4.2  3.5 - 5.1 (mEq/L)    Chloride 106  96 - 112 (mEq/L)    CO2 23  19 - 32 (mEq/L)    Glucose, Bld 91  70 - 99 (mg/dL)    BUN 10  6 - 23 (mg/dL)    Creatinine, Ser 1.61  0.50 - 1.35 (mg/dL)    Calcium 8.8  8.4 - 10.5 (mg/dL)    Total Protein 7.5  6.0 - 8.3 (g/dL)    Albumin 4.0  3.5 - 5.2 (g/dL)    AST 40 (*) 0 - 37 (U/L)    ALT 27  0 - 53 (U/L)    Alkaline Phosphatase 85  39 - 117 (U/L)    Total Bilirubin 3.5 (*) 0.3 - 1.2 (mg/dL)    GFR calc non Af Amer >90  >90 (mL/min)    GFR calc Af Amer >90  >90 (mL/min)   CBC     Status: Abnormal   Collection Time   01/13/11  6:19 AM      Component Value Range Comment   WBC 14.8 (*) 4.0 - 10.5 (K/uL)    RBC 2.32 (*) 4.22 - 5.81 (MIL/uL)    Hemoglobin 7.1 (*) 13.0 - 17.0 (g/dL)    HCT 09.6 (*) 04.5 - 52.0 (%)    MCV 90.1  78.0 - 100.0 (fL)    MCH 30.6  26.0 - 34.0 (pg)    MCHC 34.0  30.0 - 36.0 (g/dL)    RDW 40.9 (*) 81.1 - 15.5 (%)    Platelets 549 (*) 150 - 400 (K/uL)   CBC     Status: Abnormal   Collection Time   01/13/11 12:10 PM      Component Value Range Comment   WBC 14.9 (*) 4.0 - 10.5 (K/uL)    RBC 2.33 (*) 4.22 - 5.81 (MIL/uL)    Hemoglobin 7.1 (*) 13.0 - 17.0 (g/dL)    HCT 91.4 (*) 78.2 - 52.0 (%)    MCV 90.1  78.0 - 100.0 (fL)    MCH 30.5  26.0 - 34.0 (pg)    MCHC 33.8  30.0 - 36.0 (g/dL)  RDW 18.1 (*) 11.5 - 15.5 (%)    Platelets 567 (*) 150 - 400 (K/uL)   PREPARE RBC (CROSSMATCH)     Status: Normal   Collection Time   01/13/11  4:30 PM      Component Value Range Comment   Order Confirmation ORDER PROCESSED BY BLOOD BANK     COMPREHENSIVE METABOLIC PANEL     Status: Abnormal   Collection Time   01/14/11  5:00 AM      Component Value Range Comment   Sodium 135  135 - 145 (mEq/L)    Potassium 4.7  3.5 - 5.1 (mEq/L)    Chloride 103  96 - 112 (mEq/L)     CO2 22  19 - 32 (mEq/L)    Glucose, Bld 104 (*) 70 - 99 (mg/dL)    BUN 13  6 - 23 (mg/dL)    Creatinine, Ser 1.61  0.50 - 1.35 (mg/dL)    Calcium 9.2  8.4 - 10.5 (mg/dL)    Total Protein 7.2  6.0 - 8.3 (g/dL)    Albumin 4.3  3.5 - 5.2 (g/dL)    AST 47 (*) 0 - 37 (U/L)    ALT 35  0 - 53 (U/L)    Alkaline Phosphatase 97  39 - 117 (U/L)    Total Bilirubin 3.6 (*) 0.3 - 1.2 (mg/dL)    GFR calc non Af Amer >90  >90 (mL/min)    GFR calc Af Amer >90  >90 (mL/min)   CBC     Status: Abnormal   Collection Time   01/14/11  5:00 AM      Component Value Range Comment   WBC 14.6 (*) 4.0 - 10.5 (K/uL)    RBC 2.78 (*) 4.22 - 5.81 (MIL/uL)    Hemoglobin 8.3 (*) 13.0 - 17.0 (g/dL)    HCT 09.6 (*) 04.5 - 52.0 (%)    MCV 90.6  78.0 - 100.0 (fL)    MCH 29.9  26.0 - 34.0 (pg)    MCHC 32.9  30.0 - 36.0 (g/dL)    RDW 40.9 (*) 81.1 - 15.5 (%)    Platelets 563 (*) 150 - 400 (K/uL)     Studies/Results: No results found.  Patient Active Problem List  Diagnoses  . Sickle cell anemia  . Leukocytosis  . Chronic pain  . Hypokalemia    Impression: 1. Resolving sickle cell crisis. 2. The shoulder pain secondary to osteonecrosis. 3. Hypokalemia resolved.   Plan: 1. Discharge patient a.m. 2. Followup as an outpatient. 3. Orthopedic evaluation should his pain persist. He's had a steroid injection in the shoulder previously.   [name], Uriyah Massimo 01/14/2011 9:39 PM

## 2011-01-15 LAB — CBC
HCT: 24.9 % — ABNORMAL LOW (ref 39.0–52.0)
Hemoglobin: 8 g/dL — ABNORMAL LOW (ref 13.0–17.0)
MCHC: 32.1 g/dL (ref 30.0–36.0)
WBC: 12.6 10*3/uL — ABNORMAL HIGH (ref 4.0–10.5)

## 2011-01-15 LAB — COMPREHENSIVE METABOLIC PANEL
ALT: 35 U/L (ref 0–53)
Calcium: 9.3 mg/dL (ref 8.4–10.5)
GFR calc Af Amer: >90 mL/min (ref >90)
Glucose, Bld: 103 mg/dL — ABNORMAL HIGH (ref 70–99)
Sodium: 138 mEq/L (ref 135–145)
Total Protein: 7.7 g/dL (ref 6.0–8.3)

## 2011-01-15 NOTE — Discharge Summary (Signed)
Physician Discharge Summary  Patient ID: [name] MRN: [id number] DOB/AGE: Aug 20, 1980 30 y.o.  Admit date: 01/04/2011 Discharge date: 01/15/2011  Admission Diagnoses: Sickle Cell Anemia Chronic pain Hypokalemia Leukocytosis  Discharge Diagnoses:  Active Problems:  Sickle cell anemia  Leukocytosis  Chronic pain  Hypokalemia   Discharged Condition: good  Hospital Course: Patient was admitted for pain control. He received PRBC transfusion for low Hgb. His condition improved slowly over 10-11 days and he was discharged home in satisfactory condition with follow up by primary care.  Consults: none  Treatments: IV hydration  Discharge Exam: Blood pressure 114/68, pulse 88, temperature 97 F (36.1 C), temperature source Oral, resp. rate 18, height 6' (1.829 m), weight 75.4 kg (166 lb 3.6 oz), SpO2 96.00%. Physical Exam  Constitutional: He is oriented to person, place, and time. He appears well-developed and well-nourished.  Head: Normocephalic and atraumatic.  Eyes: Conjunctivae and EOM are normal. Pupils are equal, round, and reactive to light.  Neck: Normal range of motion. Neck supple.  Cardiovascular: Normal rate and regular rhythm.  Respiratory: Effort normal and breath sounds normal.  GI: Soft. Bowel sounds are normal.  Musculoskeletal: He exhibits no edema.  Neurological: He is alert and oriented to person, place, and time. He has normal reflexes.  Skin: Skin is warm and dry.  Psychiatric: He has a normal mood and affect.    Disposition: Home or Self Care  Discharge Orders    Future Orders Please Complete By Expires   Diet - low sodium heart healthy      Increase activity slowly      No wound care      Call MD for:  severe uncontrolled pain        Current Discharge Medication List    CONTINUE these medications which have CHANGED   Details  HYDROmorphone (DILAUDID) 4 MG tablet Take 1 tablet (4 mg total) by mouth every 4 (four) hours as needed. For  pain Qty: 60 tablet, Refills: 0    !! OLANZapine (ZYPREXA) 10 MG tablet Take 1 tablet (10 mg total) by mouth at bedtime. Qty: 30 tablet, Refills: 1   Associated Diagnoses: Sickle cell anemia with crisis    oxyCODONE (OXYCONTIN) 40 MG 12 hr tablet Take 1 tablet (40 mg total) by mouth every 12 (twelve) hours. pain Qty: 30 tablet, Refills: 0    promethazine (PHENERGAN) 25 MG tablet Take 1 tablet (25 mg total) by mouth every 6 (six) hours as needed for nausea. Qty: 30 tablet, Refills: 0    zolpidem (AMBIEN) 10 MG tablet Take 1 tablet (10 mg total) by mouth at bedtime as needed. sleep Qty: 30 tablet, Refills: 0     !! - Potential duplicate medications found. Please discuss with provider.    CONTINUE these medications which have NOT CHANGED   Details  folic acid (FOLVITE) 1 MG tablet Take 1 tablet (1 mg total) by mouth daily. Qty: 100 tablet, Refills: 2   Associated Diagnoses: Sickle cell anemia with crisis    hydroxyurea (HYDREA) 500 MG capsule Take 500 mg by mouth 2 (two) times daily. May take with food to minimize GI side effects.    ibuprofen (ADVIL,MOTRIN) 800 MG tablet Take 800 mg by mouth every 8 (eight) hours as needed. pain    !! OLANZapine (ZYPREXA) 10 MG tablet Take 10 mg by mouth at bedtime as needed. for sleep.     !! - Potential duplicate medications found. Please discuss with provider.     Follow-up  Information    Follow up with [name] .         SignedOrpah Cobb S 01/15/2011, 9:44 AM

## 2011-02-07 ENCOUNTER — Encounter (HOSPITAL_COMMUNITY): Payer: Self-pay | Admitting: *Deleted

## 2011-02-07 ENCOUNTER — Inpatient Hospital Stay (HOSPITAL_COMMUNITY)
Admission: EM | Admit: 2011-02-07 | Discharge: 2011-02-21 | DRG: 812 | Disposition: A | Discharge status: Home / Self Care | Payer: Medicare Other | Attending: Internal Medicine | Admitting: Internal Medicine

## 2011-02-07 ENCOUNTER — Emergency Department (HOSPITAL_COMMUNITY): Payer: Medicare Other

## 2011-02-07 DIAGNOSIS — D5700 Hb-SS disease with crisis, unspecified: Secondary | ICD-10-CM | POA: Diagnosis not present

## 2011-02-07 DIAGNOSIS — E876 Hypokalemia: Secondary | ICD-10-CM | POA: Diagnosis not present

## 2011-02-07 DIAGNOSIS — D72829 Elevated white blood cell count, unspecified: Secondary | ICD-10-CM

## 2011-02-07 DIAGNOSIS — D473 Essential (hemorrhagic) thrombocythemia: Secondary | ICD-10-CM | POA: Diagnosis present

## 2011-02-07 DIAGNOSIS — D7289 Other specified disorders of white blood cells: Secondary | ICD-10-CM | POA: Diagnosis not present

## 2011-02-07 DIAGNOSIS — R079 Chest pain, unspecified: Secondary | ICD-10-CM | POA: Diagnosis not present

## 2011-02-07 DIAGNOSIS — E872 Acidosis, unspecified: Secondary | ICD-10-CM | POA: Diagnosis present

## 2011-02-07 DIAGNOSIS — D573 Sickle-cell trait: Secondary | ICD-10-CM | POA: Diagnosis not present

## 2011-02-07 DIAGNOSIS — D75839 Thrombocytosis, unspecified: Secondary | ICD-10-CM | POA: Diagnosis present

## 2011-02-07 DIAGNOSIS — R0789 Other chest pain: Secondary | ICD-10-CM | POA: Diagnosis not present

## 2011-02-07 DIAGNOSIS — D571 Sickle-cell disease without crisis: Secondary | ICD-10-CM | POA: Diagnosis not present

## 2011-02-07 DIAGNOSIS — Z96649 Presence of unspecified artificial hip joint: Secondary | ICD-10-CM | POA: Diagnosis not present

## 2011-02-07 DIAGNOSIS — D5701 Hb-SS disease with acute chest syndrome: Secondary | ICD-10-CM

## 2011-02-07 DIAGNOSIS — J984 Other disorders of lung: Secondary | ICD-10-CM | POA: Diagnosis not present

## 2011-02-07 HISTORY — DX: Encounter for other specified aftercare: Z51.89

## 2011-02-07 HISTORY — DX: Reserved for inherently not codable concepts without codable children: IMO0001

## 2011-02-07 LAB — COMPREHENSIVE METABOLIC PANEL
ALT: 27 U/L (ref 0–53)
Alkaline Phosphatase: 98 U/L (ref 39–117)
CO2: 17 mEq/L — ABNORMAL LOW (ref 19–32)
Chloride: 108 mEq/L (ref 96–112)
GFR calc Af Amer: >90 mL/min (ref >90)
GFR calc non Af Amer: >90 mL/min (ref >90)
Glucose, Bld: 101 mg/dL — ABNORMAL HIGH (ref 70–99)
Potassium: 3.2 mEq/L — ABNORMAL LOW (ref 3.5–5.1)
Sodium: 138 mEq/L (ref 135–145)

## 2011-02-07 LAB — DIFFERENTIAL
Basophils Absolute: 0.2 10*3/uL — ABNORMAL HIGH (ref 0.0–0.1)
Eosinophils Absolute: 0.5 10*3/uL (ref 0.0–0.7)
Lymphocytes Relative: 15 % (ref 12–46)
Monocytes Absolute: 2.7 10*3/uL — ABNORMAL HIGH (ref 0.1–1.0)
Neutro Abs: 17.3 10*3/uL — ABNORMAL HIGH (ref 1.7–7.7)
Neutrophils Relative %: 71 % (ref 43–77)

## 2011-02-07 LAB — CBC
MCH: 31.2 pg (ref 26.0–34.0)
MCHC: 34 g/dL (ref 30.0–36.0)
RDW: 18.2 % — ABNORMAL HIGH (ref 11.5–15.5)

## 2011-02-07 LAB — URINALYSIS, ROUTINE W REFLEX MICROSCOPIC
Glucose, UA: NEGATIVE mg/dL
Hgb urine dipstick: NEGATIVE
Specific Gravity, Urine: 1.012 (ref 1.005–1.030)
pH: 7 (ref 5.0–8.0)

## 2011-02-07 LAB — SAMPLE TO BLOOD BANK

## 2011-02-07 MED ORDER — DIPHENHYDRAMINE HCL 50 MG/ML IJ SOLN
25.0000 mg | Freq: Once | INTRAMUSCULAR | Status: AC
  Administered 2011-02-07: 25 mg via INTRAVENOUS
  Filled 2011-02-07: qty 1

## 2011-02-07 MED ORDER — POTASSIUM CHLORIDE CRYS ER 20 MEQ PO TBCR
20.0000 meq | EXTENDED_RELEASE_TABLET | Freq: Every day | ORAL | Status: DC
  Administered 2011-02-07 – 2011-02-09 (×3): 20 meq via ORAL
  Filled 2011-02-07 (×4): qty 1

## 2011-02-07 MED ORDER — HYDROCODONE-ACETAMINOPHEN 5-325 MG PO TABS
1.0000 | ORAL_TABLET | ORAL | Status: DC | PRN
  Administered 2011-02-08: 2 via ORAL
  Filled 2011-02-07 (×2): qty 2

## 2011-02-07 MED ORDER — HYDROMORPHONE HCL PF 1 MG/ML IJ SOLN
2.0000 mg | INTRAMUSCULAR | Status: DC | PRN
  Administered 2011-02-07 – 2011-02-08 (×5): 2 mg via INTRAVENOUS
  Filled 2011-02-07 (×5): qty 2

## 2011-02-07 MED ORDER — OLANZAPINE 10 MG PO TABS
10.0000 mg | ORAL_TABLET | Freq: Every day | ORAL | Status: DC
  Administered 2011-02-07 – 2011-02-20 (×14): 10 mg via ORAL
  Filled 2011-02-07 (×15): qty 1

## 2011-02-07 MED ORDER — FOLIC ACID 1 MG PO TABS
1.0000 mg | ORAL_TABLET | Freq: Every day | ORAL | Status: DC
  Administered 2011-02-07 – 2011-02-21 (×15): 1 mg via ORAL
  Filled 2011-02-07 (×15): qty 1

## 2011-02-07 MED ORDER — HYDROMORPHONE HCL PF 2 MG/ML IJ SOLN
2.0000 mg | Freq: Once | INTRAMUSCULAR | Status: AC
  Administered 2011-02-07: 2 mg via INTRAVENOUS
  Filled 2011-02-07: qty 1

## 2011-02-07 MED ORDER — HYDROMORPHONE HCL PF 1 MG/ML IJ SOLN
1.0000 mg | INTRAMUSCULAR | Status: DC | PRN
  Administered 2011-02-07: 1 mg via INTRAVENOUS
  Filled 2011-02-07 (×2): qty 1

## 2011-02-07 MED ORDER — HYDROXYUREA 500 MG PO CAPS
500.0000 mg | ORAL_CAPSULE | Freq: Two times a day (BID) | ORAL | Status: DC
  Administered 2011-02-07 – 2011-02-21 (×28): 500 mg via ORAL
  Filled 2011-02-07 (×29): qty 1

## 2011-02-07 MED ORDER — HYDROMORPHONE HCL PF 1 MG/ML IJ SOLN
1.0000 mg | INTRAMUSCULAR | Status: DC | PRN
  Administered 2011-02-07 (×2): 1 mg via INTRAVENOUS
  Filled 2011-02-07 (×2): qty 1

## 2011-02-07 MED ORDER — SODIUM CHLORIDE 0.9 % IV SOLN: INTRAVENOUS | Status: DC

## 2011-02-07 MED ORDER — HYDROMORPHONE HCL PF 1 MG/ML IJ SOLN
1.0000 mg | Freq: Once | INTRAMUSCULAR | Status: AC
  Administered 2011-02-07: 1 mg via INTRAVENOUS
  Filled 2011-02-07: qty 1

## 2011-02-07 MED ORDER — ENOXAPARIN SODIUM 40 MG/0.4ML ~~LOC~~ SOLN
40.0000 mg | SUBCUTANEOUS | Status: DC
  Administered 2011-02-07 – 2011-02-20 (×13): 40 mg via SUBCUTANEOUS
  Filled 2011-02-07 (×16): qty 0.4

## 2011-02-07 MED ORDER — KETOROLAC TROMETHAMINE 30 MG/ML IJ SOLN
30.0000 mg | Freq: Four times a day (QID) | INTRAMUSCULAR | Status: DC
  Administered 2011-02-08 (×2): 30 mg via INTRAVENOUS
  Filled 2011-02-07 (×6): qty 1

## 2011-02-07 MED ORDER — IBUPROFEN 800 MG PO TABS: 800.0000 mg | ORAL_TABLET | Freq: Three times a day (TID) | ORAL | Status: DC | PRN

## 2011-02-07 MED ORDER — SODIUM CHLORIDE 0.45 % IV SOLN: INTRAVENOUS | Status: DC

## 2011-02-07 MED ORDER — SODIUM CHLORIDE 0.9 % IV BOLUS (SEPSIS)
1000.0000 mL | Freq: Once | INTRAVENOUS | Status: AC
  Administered 2011-02-07: 1000 mL via INTRAVENOUS

## 2011-02-07 MED ORDER — DIPHENHYDRAMINE HCL 50 MG/ML IJ SOLN
25.0000 mg | Freq: Four times a day (QID) | INTRAMUSCULAR | Status: DC | PRN
  Administered 2011-02-07 – 2011-02-10 (×12): 25 mg via INTRAVENOUS
  Filled 2011-02-07 (×12): qty 1

## 2011-02-07 MED ORDER — ZOLPIDEM TARTRATE 10 MG PO TABS
10.0000 mg | ORAL_TABLET | Freq: Every evening | ORAL | Status: DC | PRN
  Administered 2011-02-09 – 2011-02-20 (×12): 10 mg via ORAL
  Filled 2011-02-07 (×13): qty 1

## 2011-02-07 MED ORDER — POTASSIUM CHLORIDE IN NACL 20-0.45 MEQ/L-% IV SOLN
INTRAVENOUS | Status: DC
  Administered 2011-02-07: 20:00:00 via INTRAVENOUS
  Administered 2011-02-08: 1000 mL via INTRAVENOUS
  Administered 2011-02-08 – 2011-02-09 (×3): via INTRAVENOUS
  Filled 2011-02-07 (×9): qty 1000

## 2011-02-07 MED ORDER — POTASSIUM CHLORIDE CRYS ER 20 MEQ PO TBCR
40.0000 meq | EXTENDED_RELEASE_TABLET | Freq: Once | ORAL | Status: AC
  Administered 2011-02-07: 40 meq via ORAL
  Filled 2011-02-07: qty 2

## 2011-02-07 MED ORDER — OXYCODONE HCL 40 MG PO TB12
40.0000 mg | ORAL_TABLET | Freq: Two times a day (BID) | ORAL | Status: DC
  Administered 2011-02-07 – 2011-02-21 (×28): 40 mg via ORAL
  Filled 2011-02-07 (×28): qty 1

## 2011-02-07 NOTE — H&P (Signed)
Hospital Admission Note Date: 02/07/2011  Patient name: [name] Medical record number: [id number] Date of birth: 1980/08/13 Age: 31 y.o. Gender: male PCP: [name], MD, MD  Attending physician: [name] Rama Emergency Contact:  [name] (mother) [phone] Code Status: Full  Chief Complaint: Sickle cell pain  History of Present Illness: [name] is an 31 y.o. 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  He denies any associated F/C, N/V.  Oral pain medications have not helped alleviate his pain.  Ambulation and movement aggravates the pain.  Pain is 7-8/10 at worst.   Past Medical History Past Medical History  Diagnosis Date  . Sickle cell anemia   . Blood transfusion     Past Surgical History Past Surgical History  Procedure Date  . Joint replacement     right hip  . Hip surgery     Meds: Prior to Admission medications   Medication Sig Start Date End Date Taking? Authorizing Provider  folic acid (FOLVITE) 1 MG tablet Take 1 tablet (1 mg total) by mouth daily. 12/23/10 12/23/11 Yes Willey Blade, MD  HYDROmorphone (DILAUDID) 4 MG tablet Take 1 tablet (4 mg total) by mouth every 4 (four) hours as needed. For pain 01/14/11  Yes Willey Blade, MD  hydroxyurea (HYDREA) 500 MG capsule Take 500 mg by mouth 2 (two) times daily. May take with food to minimize GI side effects.   Yes Historical Provider, MD  ibuprofen (ADVIL,MOTRIN) 800 MG tablet Take 800 mg by mouth every 8 (eight) hours as needed. pain   Yes Historical Provider, MD  OLANZapine (ZYPREXA) 10 MG tablet Take 1 tablet (10 mg total) by mouth at bedtime. 01/14/11 02/13/11 Yes Willey Blade, MD  oxyCODONE (OXYCONTIN) 40 MG 12 hr tablet Take 1 tablet (40 mg total) by mouth every 12 (twelve) hours. pain 01/14/11  Yes Willey Blade, MD  potassium chloride SA (K-DUR,KLOR-CON)  20 MEQ tablet Take 20 mEq by mouth daily.     Yes Historical Provider, MD  zolpidem (AMBIEN) 10 MG tablet Take 1 tablet (10 mg total) by mouth at bedtime as needed. sleep 01/14/11  Yes Willey Blade, MD    Allergies: Morphine and related  Social History: History   Social History  . Marital Status: Single    Spouse Name: N/A    Number of Children: N/A  . Years of Education: 13   Occupational History  . Unemployed    Social History Main Topics  . Smoking status: Former Smoker -- 13 years    Quit date: 07/08/2010  . Smokeless tobacco: Never Used  . Alcohol Use: No  . Drug Use: No     quit smoking 2011  . Sexually Active: Yes    Birth Control/ Protection: None   Other Topics Concern  . Not on file   Social History Narrative   Single.  Lives with brother.    Family History:  Family History  Problem Relation Age of Onset  . Sickle cell anemia Mother   . Sickle cell anemia Father   . Sickle cell trait Brother     Review of Systems: Constitutional: No fever or chills;  Appetite normal; No weight loss.  HEENT: No blurry vision or diplopia, no pharyngitis, dry cough; CV: No chest pain or arrhythmia.  Resp: No SOB, + dry cough. GI: No N/V, no diarrhea, no  melena or hematochezia.  GU: No dysuria or hematuria.  MSK: + myalgias/arthralgias.  Neuro:  No headache or focal neurological deficits.  Psych: No depression or anxiety.  Endo: No thyroid disease or DM.  Skin: No rashes or lesions.  Heme: + SSA Physical Exam: Blood pressure 130/89, pulse 83, temperature 98.1 F (36.7 C), temperature source Oral, resp. rate 18, SpO2 97.00%. BP 130/89  Pulse 83  Temp(Src) 98.1 F (36.7 C) (Oral)  Resp 18  SpO2 97%  General Appearance:    Alert, cooperative, no distress, appears stated age  Head:    Normocephalic, without obvious abnormality, atraumatic  Eyes:    PERRL, conjunctiva/corneas clear, EOM's intact     Ears:    Normal external ear canals, both ears  Nose:   Nares normal, septum  midline, mucosa normal, no drainage    or sinus tenderness  Throat:   Lips, mucosa, and tongue normal; teeth and gums normal  Neck:   Supple, symmetrical, trachea midline, no adenopathy;       thyroid:  No enlargement/tenderness/nodules; no carotid   bruit or JVD  Back:     Symmetric, no curvature, ROM normal, no CVA tenderness  Lungs:     Clear to auscultation bilaterally, respirations unlabored  Chest wall:    No tenderness or deformity  Heart:    Regular rate and rhythm, S1 and S2 normal, no murmur, rub   or gallop  Abdomen:     Soft, non-tender, bowel sounds active all four quadrants,    no masses, no organomegaly  Extremities:   Extremities normal, atraumatic, no cyanosis or edema  Pulses:   2+ and symmetric all extremities  Skin:   Skin color, texture, turgor normal, no rashes or lesions  Neurologic:   CNII-XII intact.   Lab results: Basic Metabolic Panel:  Lab 02/07/11 3086  NA 138  K 3.2*  CL 108  CO2 17*  GLUCOSE 101*  BUN 8  CREATININE 0.62  CALCIUM 8.6  MG --  PHOS --   GFR The CrCl is unknown because both a height and weight (above a minimum accepted value) are required for this calculation. Liver Function Tests:  Lab 02/07/11 0836  AST 47*  ALT 27  ALKPHOS 98  BILITOT 3.8*  PROT 7.3  ALBUMIN 4.1    CBC:  Lab 02/07/11 0836  WBC 24.3*  NEUTROABS 17.3*  HGB 7.3*  HCT 21.5*  MCV 91.9  PLT 481*   Anemia work up  [employer] 02/07/11 0836  VITAMINB12 --  FOLATE --  FERRITIN --  TIBC --  IRON --  RETICCTPCT 14.7*    Imaging results:  Dg Chest 2 View  02/07/2011  *RADIOLOGY REPORT*  Clinical Data: Left posterior lateral and lower chest pain. Sickle cell disease.  CHEST - 2 VIEW  Comparison: 12/06 and 01/04/2011  Findings: Stable cardiomegaly.  Pulmonary vascularity is normal. Stable slight scarring at the lung bases.  No acute infiltrates or effusions.  No osseous abnormality.  IMPRESSION: No acute disease.  Original Report Authenticated By: Gwynn Burly, M.D.    Assessment & Plan: Active Problems:  Sickle cell anemia We will admit the patient and start him on hypotonic IVF.  We will continue Hydrea and folic acid.  We will use dilaudid for pain control.  We will start ATC Toradol.  We will consult the sickle cell team in the morning.  Leukocytosis Chronic.  Likely secondary to sickle cell disease related inflammation.  Toradol for anti-inflammatory purposes.  Hypokalemia Replete.  Metabolic acidosis Check serum lactate and urinary ketones.  Thrombocytosis Reactive. Prophylaxis Lovenox for DVT prophylaxis  Time Spent On Admission: 50 minutes.  RAMA,CHRISTINA 02/07/2011, 5:56 PM Pager [phone]

## 2011-02-07 NOTE — ED Notes (Signed)
Prior to transporting to floor, pt asked for more pain meds. Pt stated it had been a couple hours before receiving meds Rn pulled Prn pain med to give pt. Primary RN informed RN that pt had pain meds approx 1 hr ago. Pt stated "i forgot about that". Pt transported to floor.

## 2011-02-07 NOTE — ED Provider Notes (Signed)
History     CSN: [id number]  Arrival date & time 02/07/11  9147   First MD Initiated Contact with Patient 02/07/11 [phone]      Chief Complaint  Patient presents with  . Sickle Cell Pain Crisis    (Consider location/radiation/quality/duration/timing/severity/associated sxs/prior treatment) Patient is a 31 y.o. male presenting with sickle cell pain. The history is provided by the patient and medical records.  Sickle Cell Pain Crisis  This is a recurrent problem. The current episode started 2 days ago. The onset was gradual. The problem has been gradually worsening. The pain is associated with an unknown factor. The pain is present in the right side and lower extremities. Site of pain is localized in bone and muscle. The pain is similar to prior episodes (multiple previous episodes associated with SSC). The pain is moderate. The symptoms are not relieved by one or more prescription drugs. The symptoms are aggravated by movement. Pertinent negatives include no chest pain, no abdominal pain, no diarrhea, no nausea, no vomiting, no dysuria, no hematuria, no headaches, no neck pain, no neck stiffness, no loss of sensation, no cough, no difficulty breathing and no rash. There is no swelling present. He has been behaving normally.    Past Medical History  Diagnosis Date  . Sickle cell anemia     Past Surgical History  Procedure Date  . Joint replacement     right hip  . Hip surgery     Family History  Problem Relation Age of Onset  . Sickle cell anemia Mother   . Sickle cell anemia Father     History  Substance Use Topics  . Smoking status: Former Games developer  . Smokeless tobacco: Never Used  . Alcohol Use: No      Review of Systems  Constitutional: Negative for fever and chills.  HENT: Negative for neck pain and neck stiffness.   Respiratory: Negative for cough, chest tightness and shortness of breath.   Cardiovascular: Negative for chest pain and leg swelling.  Gastrointestinal:  Negative for nausea, vomiting, abdominal pain and diarrhea.  Genitourinary: Negative for dysuria and hematuria.  Skin: Negative for rash.  Neurological: Negative for dizziness, numbness and headaches.    Allergies  Morphine and related  Home Medications   Current Outpatient Rx  Name Route Sig Dispense Refill  . FOLIC ACID 1 MG PO TABS Oral Take 1 tablet (1 mg total) by mouth daily. 100 tablet 2  . HYDROMORPHONE HCL 4 MG PO TABS Oral Take 1 tablet (4 mg total) by mouth every 4 (four) hours as needed. For pain 60 tablet 0  . HYDROXYUREA 500 MG PO CAPS Oral Take 500 mg by mouth 2 (two) times daily. May take with food to minimize GI side effects.    . IBUPROFEN 800 MG PO TABS Oral Take 800 mg by mouth every 8 (eight) hours as needed. pain    . OLANZAPINE 10 MG PO TABS Oral Take 1 tablet (10 mg total) by mouth at bedtime. 30 tablet 1  . OXYCODONE HCL ER 40 MG PO TB12 Oral Take 1 tablet (40 mg total) by mouth every 12 (twelve) hours. pain 30 tablet 0  . POTASSIUM CHLORIDE CRYS ER 20 MEQ PO TBCR Oral Take 20 mEq by mouth daily.      Marland Kitchen ZOLPIDEM TARTRATE 10 MG PO TABS Oral Take 1 tablet (10 mg total) by mouth at bedtime as needed. sleep 30 tablet 0    BP 125/72  Pulse 97  Temp(Src) 98.6  F (37 C) (Oral)  Resp 20  SpO2 97%  Physical Exam  Nursing note and vitals reviewed. Constitutional: He is oriented to person, place, and time. He appears well-developed and well-nourished. No distress.  HENT:  Head: Normocephalic and atraumatic.  Mouth/Throat: Oropharynx is clear and moist.  Eyes: EOM are normal. Pupils are equal, round, and reactive to light.  Neck: Normal range of motion. Neck supple.  Cardiovascular: Normal rate and regular rhythm.   Pulmonary/Chest: Effort normal and breath sounds normal. No respiratory distress. He has no wheezes. He has no rales.  Abdominal: Soft. Bowel sounds are normal. There is no tenderness. There is no rebound and no guarding.  Musculoskeletal: Normal  range of motion. He exhibits tenderness (mild ttp over proximal anterior thighs. No deformity or evidence of trauma). He exhibits no edema.  Neurological: He is alert and oriented to person, place, and time.  Skin: Skin is warm and dry. No rash noted. No erythema.  Psychiatric: He has a normal mood and affect. His behavior is normal.    ED Course  Procedures (including critical care time)  Labs Reviewed  COMPREHENSIVE METABOLIC PANEL - Abnormal; Notable for the following:    Potassium 3.2 (*)    CO2 17 (*)    Glucose, Bld 101 (*)    AST 47 (*)    Total Bilirubin 3.8 (*)    All other components within normal limits  CBC - Abnormal; Notable for the following:    WBC 24.3 (*)    RBC 2.34 (*)    Hemoglobin 7.3 (*)    HCT 21.5 (*)    RDW 18.2 (*)    Platelets 481 (*)    All other components within normal limits  DIFFERENTIAL - Abnormal; Notable for the following:    Neutro Abs 17.3 (*)    Monocytes Absolute 2.7 (*)    Basophils Absolute 0.2 (*)    All other components within normal limits  RETICULOCYTES - Abnormal; Notable for the following:    Retic Ct Pct 14.7 (*)    RBC. 2.34 (*)    Retic Count, Manual 344.0 (*)    All other components within normal limits  URINALYSIS, ROUTINE W REFLEX MICROSCOPIC  SAMPLE TO BLOOD BANK   Dg Chest 2 View  02/07/2011  *RADIOLOGY REPORT*  Clinical Data: Left posterior lateral and lower chest pain. Sickle cell disease.  CHEST - 2 VIEW  Comparison: 12/06 and 01/04/2011  Findings: Stable cardiomegaly.  Pulmonary vascularity is normal. Stable slight scarring at the lung bases.  No acute infiltrates or effusions.  No osseous abnormality.  IMPRESSION: No acute disease.  Original Report Authenticated By: Gwynn Burly, M.D.     No diagnosis found.    MDM  Will check basic labs, rehydrate and treat with narcotics. Will reassess for possible D/C  Pt still c/o pain despite IVF and medication. Will admit to [facility]        Loren Racer, MD 02/08/11 [phone]

## 2011-02-07 NOTE — ED Notes (Signed)
Pt c/o pain r/t sickle cell crisis starting x 3 days ago. Denies CP, SOB, N/V/D at this time. C/o pain in back and bilateral legs.

## 2011-02-07 NOTE — ED Notes (Signed)
Patient ate 100 % meal.

## 2011-02-07 NOTE — ED Notes (Signed)
Unable to obtain enough blood for cmp phelbotomy to attempt blood draw

## 2011-02-08 ENCOUNTER — Inpatient Hospital Stay (HOSPITAL_COMMUNITY): Payer: Medicare Other

## 2011-02-08 DIAGNOSIS — D473 Essential (hemorrhagic) thrombocythemia: Secondary | ICD-10-CM | POA: Diagnosis not present

## 2011-02-08 DIAGNOSIS — Z09 Encounter for follow-up examination after completed treatment for conditions other than malignant neoplasm: Secondary | ICD-10-CM | POA: Diagnosis not present

## 2011-02-08 DIAGNOSIS — J9819 Other pulmonary collapse: Secondary | ICD-10-CM | POA: Diagnosis not present

## 2011-02-08 DIAGNOSIS — E876 Hypokalemia: Secondary | ICD-10-CM | POA: Diagnosis not present

## 2011-02-08 DIAGNOSIS — D5701 Hb-SS disease with acute chest syndrome: Secondary | ICD-10-CM | POA: Diagnosis not present

## 2011-02-08 DIAGNOSIS — E872 Acidosis: Secondary | ICD-10-CM | POA: Diagnosis not present

## 2011-02-08 DIAGNOSIS — D5700 Hb-SS disease with crisis, unspecified: Secondary | ICD-10-CM | POA: Diagnosis not present

## 2011-02-08 DIAGNOSIS — D72829 Elevated white blood cell count, unspecified: Secondary | ICD-10-CM | POA: Diagnosis not present

## 2011-02-08 DIAGNOSIS — I517 Cardiomegaly: Secondary | ICD-10-CM | POA: Diagnosis not present

## 2011-02-08 LAB — BASIC METABOLIC PANEL
BUN: 6 mg/dL (ref 6–23)
CO2: 18 mEq/L — ABNORMAL LOW (ref 19–32)
Chloride: 109 mEq/L (ref 96–112)
Creatinine, Ser: 0.55 mg/dL (ref 0.50–1.35)
Glucose, Bld: 87 mg/dL (ref 70–99)
Potassium: 3.5 mEq/L (ref 3.5–5.1)

## 2011-02-08 LAB — TYPE AND SCREEN: Unit division: 0

## 2011-02-08 LAB — CBC
HCT: 20.8 % — ABNORMAL LOW (ref 39.0–52.0)
Hemoglobin: 7 g/dL — ABNORMAL LOW (ref 13.0–17.0)
MCH: 31 pg (ref 26.0–34.0)
MCHC: 33.7 g/dL (ref 30.0–36.0)
MCV: 92 fL (ref 78.0–100.0)
RDW: 18.3 % — ABNORMAL HIGH (ref 11.5–15.5)

## 2011-02-08 MED ORDER — ONDANSETRON HCL 4 MG/2ML IJ SOLN
4.0000 mg | INTRAMUSCULAR | Status: DC | PRN
  Administered 2011-02-08 – 2011-02-17 (×4): 4 mg via INTRAVENOUS
  Filled 2011-02-08 (×4): qty 2

## 2011-02-08 MED ORDER — DIPHENHYDRAMINE HCL 12.5 MG/5ML PO ELIX: 12.5000 mg | ORAL_SOLUTION | Freq: Four times a day (QID) | ORAL | Status: DC | PRN

## 2011-02-08 MED ORDER — IBUPROFEN 800 MG PO TABS: 800.0000 mg | ORAL_TABLET | Freq: Three times a day (TID) | ORAL | Status: DC | PRN

## 2011-02-08 MED ORDER — NALOXONE HCL 0.4 MG/ML IJ SOLN: 0.4000 mg | INTRAMUSCULAR | Status: DC | PRN

## 2011-02-08 MED ORDER — DIPHENHYDRAMINE HCL 50 MG/ML IJ SOLN: 12.5000 mg | Freq: Four times a day (QID) | INTRAMUSCULAR | Status: DC | PRN

## 2011-02-08 MED ORDER — ONDANSETRON HCL 4 MG/2ML IJ SOLN: 4.0000 mg | Freq: Four times a day (QID) | INTRAMUSCULAR | Status: DC | PRN

## 2011-02-08 MED ORDER — HYDROMORPHONE 0.3 MG/ML IV SOLN: INTRAVENOUS | Status: DC

## 2011-02-08 MED ORDER — HYDROMORPHONE HCL PF 1 MG/ML IJ SOLN
4.0000 mg | INTRAMUSCULAR | Status: DC | PRN
  Administered 2011-02-08 (×3): 4 mg via INTRAVENOUS
  Filled 2011-02-08 (×3): qty 4

## 2011-02-08 MED ORDER — SODIUM CHLORIDE 0.9 % IJ SOLN: 9.0000 mL | INTRAMUSCULAR | Status: DC | PRN

## 2011-02-08 MED ORDER — IBUPROFEN 800 MG PO TABS
800.0000 mg | ORAL_TABLET | Freq: Three times a day (TID) | ORAL | Status: AC
  Administered 2011-02-08 (×3): 800 mg via ORAL
  Filled 2011-02-08 (×3): qty 1

## 2011-02-08 MED ORDER — SODIUM CHLORIDE 0.9 % IJ SOLN: 10.0000 mL | INTRAMUSCULAR | Status: DC | PRN

## 2011-02-08 MED ORDER — HYDROMORPHONE HCL PF 1 MG/ML IJ SOLN
2.0000 mg | INTRAMUSCULAR | Status: DC | PRN
  Administered 2011-02-08 – 2011-02-09 (×2): 2 mg via INTRAVENOUS
  Filled 2011-02-08 (×2): qty 2

## 2011-02-08 MED ORDER — HYDROMORPHONE 0.3 MG/ML IV SOLN
INTRAVENOUS | Status: DC | PRN
  Administered 2011-02-08 – 2011-02-09 (×2): via INTRAVENOUS
  Administered 2011-02-09: 5.19 mg via INTRAVENOUS
  Administered 2011-02-09: 12.79 mg via INTRAVENOUS
  Administered 2011-02-09 (×2): via INTRAVENOUS
  Filled 2011-02-08 (×6): qty 25

## 2011-02-08 MED ORDER — PANTOPRAZOLE SODIUM 40 MG PO TBEC
40.0000 mg | DELAYED_RELEASE_TABLET | Freq: Every day | ORAL | Status: DC
  Administered 2011-02-08 – 2011-02-20 (×13): 40 mg via ORAL
  Filled 2011-02-08 (×15): qty 1

## 2011-02-08 NOTE — Progress Notes (Signed)
Clinical Social Worker completed brief psychosocial assessment.  Assessment in [facility] chart.  Yaret Hush J Charna Neeb MSW, LCSW [phone]  

## 2011-02-08 NOTE — Progress Notes (Signed)
Subjective: The patient was seen on rounds today.  The patient was sitting quietly in his bed watching television.  The patient is complaining of pain 7/10 in his back and his LLE.  The patient also stated that his current pain regimen is not working for him.  Explained to the patient that he will receive a blood transfusion today and I will adjust his pain medication.  No other nursing or patient concerns.   Objective: Vital signs in last 24 hours: Blood pressure 135/90, pulse 72, temperature 98.6 F (37 C), temperature source Oral, resp. rate 18, height 6' (1.829 m), weight 178 lb 2.1 oz (80.8 kg), SpO2 99.00%.  General Appearance: Alert, cooperative and moderate distress Head: Normocephalic, without obvious abnormality, atraumatic Eyes: PERRLA, EOMI, conjunctiva/cornea is clear, scleral icterus Nose: Nares normal, septum midline, mucosa normal, no drainage or sinus tenderness. Throat: Lips, mucosa, and tongue normal, teeth and gums normal Back: Symmetric, bilateral CVA tenderness, diffuse tenderness Resp: Diminished breath sounds bibasilar (LLL > RLL) Cardio: Mid systolic click present GI: Soft, non-tender, hypoactive bowel sounds, no masses, no organomegaly Male genitalia: Deferred, the patient denies priapism Extremities: Extremities normal, atraumatic, no cyanosis or edema, negative Homans sign, no sign of DVT Pulses: 2+ and symmetric Skin: Skin color, texture, turgor normal, no rashes or lesions Neurologic: Grossly normal, CN II-XII intact, no focal deficits Psych:  Appropriate affect  Lab Results: Results for orders placed during the hospital encounter of 02/07/11 (from the past 24 hour(s))  LACTIC ACID, PLASMA     Status: Normal   Collection Time   02/07/11  8:00 PM      Component Value Range   Lactic Acid, Venous 0.7  0.5 - 2.2 (mmol/L)  BASIC METABOLIC PANEL     Status: Abnormal   Collection Time   02/08/11  3:45 AM      Component Value Range   Sodium 139  135 - 145 (mEq/L)    Potassium 3.5  3.5 - 5.1 (mEq/L)   Chloride 109  96 - 112 (mEq/L)   CO2 18 (*) 19 - 32 (mEq/L)   Glucose, Bld 87  70 - 99 (mg/dL)   BUN 6  6 - 23 (mg/dL)   Creatinine, Ser 1.19  0.50 - 1.35 (mg/dL)   Calcium 8.4  8.4 - 14.7 (mg/dL)   GFR calc non Af Amer >90  >90 (mL/min)   GFR calc Af Amer >90  >90 (mL/min)  CBC     Status: Abnormal   Collection Time   02/08/11  3:45 AM      Component Value Range   WBC 17.0 (*) 4.0 - 10.5 (K/uL)   RBC 2.26 (*) 4.22 - 5.81 (MIL/uL)   Hemoglobin 7.0 (*) 13.0 - 17.0 (g/dL)   HCT 82.9 (*) 56.2 - 52.0 (%)   MCV 92.0  78.0 - 100.0 (fL)   MCH 31.0  26.0 - 34.0 (pg)   MCHC 33.7  30.0 - 36.0 (g/dL)   RDW 13.0 (*) 86.5 - 15.5 (%)   Platelets 495 (*) 150 - 400 (K/uL)    Studies/Results: Dg Chest 2 View  02/07/2011  *RADIOLOGY REPORT*  Clinical Data: Left posterior lateral and lower chest pain. Sickle cell disease.  CHEST - 2 VIEW  Comparison: 12/06 and 01/04/2011  Findings: Stable cardiomegaly.  Pulmonary vascularity is normal. Stable slight scarring at the lung bases.  No acute infiltrates or effusions.  No osseous abnormality.  IMPRESSION: No acute disease.  Original Report Authenticated By: Gwynn Burly, M.D.  Medications: I have reviewed the patient's current medications.  Allergies  Allergen Reactions  . Morphine And Related Rash     Current Facility-Administered Medications  Medication Dose Route Frequency Provider Last Rate Last Dose  . 0.45 % NaCl with KCl 20 mEq / L infusion   Intravenous Continuous Christina Rama 100 mL/hr at 02/08/11 0328    . diphenhydrAMINE (BENADRYL) injection 25 mg  25 mg Intravenous Q6H PRN Loren Racer, MD   25 mg at 02/08/11 0829  . enoxaparin (LOVENOX) injection 40 mg  40 mg Subcutaneous Q24H Christina Rama   40 mg at 02/07/11 2139  . folic acid (FOLVITE) tablet 1 mg  1 mg Oral Daily Christina Rama   1 mg at 02/07/11 2138  . HYDROcodone-acetaminophen (NORCO) 5-325 MG per tablet 1-2 tablet  1-2 tablet Oral  Q4H PRN Christina Rama   2 tablet at 02/08/11 0328  . HYDROmorphone (DILAUDID) injection 4 mg  4 mg Intravenous Q3H PRN Larina Bras, NP      . hydroxyurea (HYDREA) capsule 500 mg  500 mg Oral BID Christina Rama   500 mg at 02/07/11 2138  . ibuprofen (ADVIL,MOTRIN) tablet 800 mg  800 mg Oral TID Larina Bras, NP      . ibuprofen (ADVIL,MOTRIN) tablet 800 mg  800 mg Oral Q8H PRN Larina Bras, NP      . OLANZapine (ZYPREXA) tablet 10 mg  10 mg Oral QHS Christina Rama   10 mg at 02/07/11 2138  . ondansetron (ZOFRAN) injection 4 mg  4 mg Intravenous Q4H PRN Larina Bras, NP      . oxyCODONE (OXYCONTIN) 12 hr tablet 40 mg  40 mg Oral BID Christina Rama   40 mg at 02/07/11 2138  . pantoprazole (PROTONIX) EC tablet 40 mg  40 mg Oral Q1200 Larina Bras, NP      . potassium chloride SA (K-DUR,KLOR-CON) CR tablet 40 mEq  40 mEq Oral Once Christina Rama   40 mEq at 02/07/11 1919  . zolpidem (AMBIEN) tablet 10 mg  10 mg Oral QHS PRN Christina Rama        Assessment/Plan: Patient Active Problem List  Diagnoses  . Sickle cell anemia  . Leukocytosis  . Chronic pain  . Hypokalemia  . Metabolic acidosis  . Thrombocytosis    Sickle Cell Crisis/Chronic Pain:  The patient will be receiving a blood transfusion today.  The patient will continue pain management, IV hydration, home medications, GI and DVT prophylaxis.  CMP, CBC, Ferritin, Mg, Phos, ProBNP, Hemoglobinopathy in the am. Leukocytosis/Thrombocytocis:  Leukocytosis is trending downward and thrombocytosis is trending upward (both may be an acute reaction) - will continue to monitor. Hypokalemia:  The patient is receiving PO and IV supplementation - will continue to monitor  Discussed and agreed upon plan of care with the patient.   The plan of care will be adjusted based on the patient's clinical progress.   Larina Bras, NP-C 02/08/2011, 11:47 AM

## 2011-02-09 DIAGNOSIS — D72829 Elevated white blood cell count, unspecified: Secondary | ICD-10-CM | POA: Diagnosis not present

## 2011-02-09 DIAGNOSIS — D5700 Hb-SS disease with crisis, unspecified: Secondary | ICD-10-CM | POA: Diagnosis not present

## 2011-02-09 DIAGNOSIS — D473 Essential (hemorrhagic) thrombocythemia: Secondary | ICD-10-CM | POA: Diagnosis not present

## 2011-02-09 DIAGNOSIS — E872 Acidosis: Secondary | ICD-10-CM | POA: Diagnosis not present

## 2011-02-09 DIAGNOSIS — D5701 Hb-SS disease with acute chest syndrome: Secondary | ICD-10-CM | POA: Diagnosis not present

## 2011-02-09 DIAGNOSIS — E876 Hypokalemia: Secondary | ICD-10-CM | POA: Diagnosis not present

## 2011-02-09 LAB — CBC
HCT: 20.6 % — ABNORMAL LOW (ref 39.0–52.0)
Hemoglobin: 7 g/dL — ABNORMAL LOW (ref 13.0–17.0)
MCH: 31.1 pg (ref 26.0–34.0)
MCHC: 34 g/dL (ref 30.0–36.0)
MCV: 91.6 fL (ref 78.0–100.0)

## 2011-02-09 LAB — COMPREHENSIVE METABOLIC PANEL
Alkaline Phosphatase: 83 U/L (ref 39–117)
BUN: 6 mg/dL (ref 6–23)
Calcium: 8.1 mg/dL — ABNORMAL LOW (ref 8.4–10.5)
Creatinine, Ser: 0.55 mg/dL (ref 0.50–1.35)
GFR calc Af Amer: >90 mL/min (ref >90)
Glucose, Bld: 104 mg/dL — ABNORMAL HIGH (ref 70–99)
Potassium: 5.3 mEq/L — ABNORMAL HIGH (ref 3.5–5.1)
Total Protein: 6.7 g/dL (ref 6.0–8.3)

## 2011-02-09 LAB — PRO B NATRIURETIC PEPTIDE: Pro B Natriuretic peptide (BNP): 338.7 pg/mL — ABNORMAL HIGH (ref 0–125)

## 2011-02-09 MED ORDER — HYDROMORPHONE HCL PF 1 MG/ML IJ SOLN
3.0000 mg | INTRAMUSCULAR | Status: DC | PRN
  Administered 2011-02-09 – 2011-02-14 (×34): 3 mg via INTRAVENOUS
  Filled 2011-02-09 (×34): qty 3

## 2011-02-09 MED ORDER — SODIUM CHLORIDE 0.9 % IV SOLN
INTRAVENOUS | Status: DC
  Administered 2011-02-09 – 2011-02-10 (×3): via INTRAVENOUS
  Administered 2011-02-10: 1000 mL via INTRAVENOUS

## 2011-02-09 MED ORDER — HYDROMORPHONE 0.3 MG/ML IV SOLN
INTRAVENOUS | Status: DC | PRN
  Administered 2011-02-09 (×2): via INTRAVENOUS
  Administered 2011-02-09: 11.51 mg via INTRAVENOUS
  Administered 2011-02-10: 8.78 mg via INTRAVENOUS
  Administered 2011-02-10: 14:00:00 via INTRAVENOUS
  Administered 2011-02-10: 25 mg via INTRAVENOUS
  Administered 2011-02-10: 04:00:00 via INTRAVENOUS
  Administered 2011-02-10: 4.95 mg via INTRAVENOUS
  Administered 2011-02-10: 4.27 mg via INTRAVENOUS
  Administered 2011-02-10: 25 mg via INTRAVENOUS
  Filled 2011-02-09 (×8): qty 25

## 2011-02-09 NOTE — Progress Notes (Addendum)
IV team called with results of post picc cxr--picc needs to be advanced 6 cm to be in  "right spot" or it could be used "as is" if OK with primary md. Paged Dr [name] and md wants picc advanced to corrected position. Paged IV team Shanda Bumps and told her. She is going to call IV PICC RN at [facility] to come and "fix it".    2315 Zack from IV PICC team just finish with PICC. Order to use PICC. It is in correct position.

## 2011-02-09 NOTE — Progress Notes (Signed)
Subjective:  Patient having severe pain to day. Rates his pain as 7/10. He has not locked out on the PCA. He would like to have breakthrough medications as well.Lower extremity pain improved. No other new complaints.   Allergies  Allergen Reactions  . Morphine And Related Rash   Current Facility-Administered Medications  Medication Dose Route Frequency Provider Last Rate Last Dose  . 0.45 % NaCl with KCl 20 mEq / L infusion   Intravenous Continuous Christina Rama 100 mL/hr at 02/09/11 0809    . diphenhydrAMINE (BENADRYL) injection 12.5 mg  12.5 mg Intravenous Q6H PRN Willey Blade, MD       Or  . diphenhydrAMINE (BENADRYL) 12.5 MG/5ML elixir 12.5 mg  12.5 mg Oral Q6H PRN Willey Blade, MD      . diphenhydrAMINE (BENADRYL) injection 25 mg  25 mg Intravenous Q6H PRN Loren Racer, MD   25 mg at 02/09/11 1220  . enoxaparin (LOVENOX) injection 40 mg  40 mg Subcutaneous Q24H Christina Rama   40 mg at 02/07/11 2139  . folic acid (FOLVITE) tablet 1 mg  1 mg Oral Daily Christina Rama   1 mg at 02/09/11 1117  . HYDROcodone-acetaminophen (NORCO) 5-325 MG per tablet 1-2 tablet  1-2 tablet Oral Q4H PRN Christina Rama   2 tablet at 02/08/11 0328  . HYDROmorphone (DILAUDID) injection 2 mg  2 mg Intravenous Q3H PRN Willey Blade, MD   2 mg at 02/08/11 2335  . HYDROmorphone (DILAUDID) PCA injection 0.3 mg/mL   Intravenous PRN Willey Blade, MD      . hydroxyurea (HYDREA) capsule 500 mg  500 mg Oral BID Christina Rama   500 mg at 02/09/11 1117  . ibuprofen (ADVIL,MOTRIN) tablet 800 mg  800 mg Oral TID Larina Bras, NP   800 mg at 02/08/11 2124  . ibuprofen (ADVIL,MOTRIN) tablet 800 mg  800 mg Oral Q8H PRN Larina Bras, NP      . naloxone [facility]) injection 0.4 mg  0.4 mg Intravenous PRN Willey Blade, MD       And  . sodium chloride 0.9 % injection 9 mL  9 mL Intravenous PRN Willey Blade, MD      . OLANZapine (ZYPREXA) tablet 10 mg  10 mg Oral QHS Christina Rama   10 mg at 02/08/11 2124  . ondansetron (ZOFRAN)  injection 4 mg  4 mg Intravenous Q4H PRN Larina Bras, NP   4 mg at 02/08/11 1932  . ondansetron (ZOFRAN) injection 4 mg  4 mg Intravenous Q6H PRN Willey Blade, MD      . oxyCODONE (OXYCONTIN) 12 hr tablet 40 mg  40 mg Oral BID Christina Rama   40 mg at 02/09/11 1117  . pantoprazole (PROTONIX) EC tablet 40 mg  40 mg Oral Q1200 Larina Bras, NP   40 mg at 02/09/11 1117  . potassium chloride SA (K-DUR,KLOR-CON) CR tablet 20 mEq  20 mEq Oral Daily Christina Rama   20 mEq at 02/09/11 1117  . sodium chloride 0.9 % injection 10 mL  10 mL Intracatheter PRN Willey Blade, MD      . zolpidem [facility]) tablet 10 mg  10 mg Oral QHS PRN Christina Rama      . DISCONTD: HYDROmorphone (DILAUDID) injection 4 mg  4 mg Intravenous Q3H PRN Larina Bras, NP   4 mg at 02/08/11 1933  . DISCONTD: HYDROmorphone (DILAUDID) PCA injection 0.3 mg/mL   Intravenous Q4H Willey Blade, MD        Objective: Blood pressure 125/77, pulse  63, temperature 98.5 F (36.9 C), temperature source Oral, resp. rate 18, height 6' (1.829 m), weight 178 lb 2.1 oz (80.8 kg), SpO2 100.00%.  WDWN black male in mild distress. HEENT:no sinus tenderness NECK:no posterior cervical nodes LUNGS:clear without vocal fremitus. CV:nl S1, S2 without S3. ABD:no epigastric tenderness. WUJ:WJXBJYNW homan's. NEURO:intact.  Lab results: Results for orders placed during the hospital encounter of 02/07/11 (from the past 48 hour(s))  LACTIC ACID, PLASMA     Status: Normal   Collection Time   02/07/11  8:00 PM      Component Value Range Comment   Lactic Acid, Venous 0.7  0.5 - 2.2 (mmol/L)   BASIC METABOLIC PANEL     Status: Abnormal   Collection Time   02/08/11  3:45 AM      Component Value Range Comment   Sodium 139  135 - 145 (mEq/L)    Potassium 3.5  3.5 - 5.1 (mEq/L)    Chloride 109  96 - 112 (mEq/L)    CO2 18 (*) 19 - 32 (mEq/L)    Glucose, Bld 87  70 - 99 (mg/dL)    BUN 6  6 - 23 (mg/dL)    Creatinine, Ser 2.95  0.50 - 1.35 (mg/dL)     Calcium 8.4  8.4 - 10.5 (mg/dL)    GFR calc non Af Amer >90  >90 (mL/min)    GFR calc Af Amer >90  >90 (mL/min)   CBC     Status: Abnormal   Collection Time   02/08/11  3:45 AM      Component Value Range Comment   WBC 17.0 (*) 4.0 - 10.5 (K/uL)    RBC 2.26 (*) 4.22 - 5.81 (MIL/uL)    Hemoglobin 7.0 (*) 13.0 - 17.0 (g/dL)    HCT 62.1 (*) 30.8 - 52.0 (%)    MCV 92.0  78.0 - 100.0 (fL)    MCH 31.0  26.0 - 34.0 (pg)    MCHC 33.7  30.0 - 36.0 (g/dL)    RDW 65.7 (*) 84.6 - 15.5 (%)    Platelets 495 (*) 150 - 400 (K/uL)   MRSA PCR SCREENING     Status: Normal   Collection Time   02/08/11  4:52 PM      Component Value Range Comment   MRSA by PCR NEGATIVE  NEGATIVE    MAGNESIUM     Status: Normal   Collection Time   02/09/11 11:28 AM      Component Value Range Comment   Magnesium 1.6  1.5 - 2.5 (mg/dL)   PHOSPHORUS     Status: Normal   Collection Time   02/09/11 11:28 AM      Component Value Range Comment   Phosphorus 3.7  2.3 - 4.6 (mg/dL)   CBC     Status: Abnormal   Collection Time   02/09/11 11:28 AM      Component Value Range Comment   WBC 14.4 (*) 4.0 - 10.5 (K/uL)    RBC 2.25 (*) 4.22 - 5.81 (MIL/uL)    Hemoglobin 7.0 (*) 13.0 - 17.0 (g/dL)    HCT 96.2 (*) 95.2 - 52.0 (%)    MCV 91.6  78.0 - 100.0 (fL)    MCH 31.1  26.0 - 34.0 (pg)    MCHC 34.0  30.0 - 36.0 (g/dL)    RDW 84.1 (*) 32.4 - 15.5 (%)    Platelets 457 (*) 150 - 400 (K/uL)   COMPREHENSIVE METABOLIC PANEL  Status: Abnormal   Collection Time   02/09/11 11:28 AM      Component Value Range Comment   Sodium 133 (*) 135 - 145 (mEq/L)    Potassium 5.3 (*) 3.5 - 5.1 (mEq/L)    Chloride 108  96 - 112 (mEq/L)    CO2 20  19 - 32 (mEq/L)    Glucose, Bld 104 (*) 70 - 99 (mg/dL)    BUN 6  6 - 23 (mg/dL)    Creatinine, Ser 1.61  0.50 - 1.35 (mg/dL)    Calcium 8.1 (*) 8.4 - 10.5 (mg/dL)    Total Protein 6.7  6.0 - 8.3 (g/dL)    Albumin 3.5  3.5 - 5.2 (g/dL)    AST 27  0 - 37 (U/L)    ALT 25  0 - 53 (U/L)    Alkaline  Phosphatase 83  39 - 117 (U/L)    Total Bilirubin 2.4 (*) 0.3 - 1.2 (mg/dL)    GFR calc non Af Amer >90  >90 (mL/min)    GFR calc Af Amer >90  >90 (mL/min)   PRO B NATRIURETIC PEPTIDE     Status: Abnormal   Collection Time   02/09/11 11:28 AM      Component Value Range Comment   Pro B Natriuretic peptide (BNP) 338.7 (*) 0 - 125 (pg/mL)     Studies/Results: Chest Portable 1 View Post Insertion To Confirm Placement As Interpreted By Radiologist  02/08/2011  *RADIOLOGY REPORT*  Clinical Data: PICC line placement, sickle cell disease  PORTABLE CHEST - 1 VIEW  Comparison: 02/07/2011  Findings: Right PICC line tip is at the innominate SVC junction. This could be advanced 6 cm to the SVC RA junction.  Heart is enlarged with vascular congestion and basilar atelectasis.  No focal pneumonia, collapse, consolidation, effusion or pneumothorax. Trachea midline.  IMPRESSION: Right PICC line tip at the innominate SVC junction, and could be advanced 6 cm.  Cardiomegaly with vascular congestion  Original Report Authenticated By: Judie Petit. Ruel Favors, M.D.    Patient Active Problem List  Diagnoses  . Sickle cell anemia  . Leukocytosis  . Chronic pain  . Hypokalemia  . Metabolic acidosis  . Thrombocytosis    Impression: 1. Sickle cell crisis. 2. Uncontrolled pain. 3. Hypokalemia. Potassium is now above normal.   Plan: 1. Adjust PCA. 2. Exchange transfusion in am. 3. Add breakthrough medications. 4. Discontinue oral potassium.   [name], Kendalyn Cranfield 02/09/2011 2:53 PM

## 2011-02-10 DIAGNOSIS — D5701 Hb-SS disease with acute chest syndrome: Secondary | ICD-10-CM | POA: Diagnosis not present

## 2011-02-10 DIAGNOSIS — E872 Acidosis: Secondary | ICD-10-CM | POA: Diagnosis not present

## 2011-02-10 DIAGNOSIS — D72829 Elevated white blood cell count, unspecified: Secondary | ICD-10-CM | POA: Diagnosis not present

## 2011-02-10 DIAGNOSIS — D473 Essential (hemorrhagic) thrombocythemia: Secondary | ICD-10-CM | POA: Diagnosis not present

## 2011-02-10 DIAGNOSIS — D5700 Hb-SS disease with crisis, unspecified: Secondary | ICD-10-CM | POA: Diagnosis not present

## 2011-02-10 DIAGNOSIS — E876 Hypokalemia: Secondary | ICD-10-CM | POA: Diagnosis not present

## 2011-02-10 LAB — COMPREHENSIVE METABOLIC PANEL
AST: 27 U/L (ref 0–37)
CO2: 22 mEq/L (ref 19–32)
Calcium: 9 mg/dL (ref 8.4–10.5)
Creatinine, Ser: 0.58 mg/dL (ref 0.50–1.35)
GFR calc Af Amer: >90 mL/min (ref >90)
GFR calc non Af Amer: >90 mL/min (ref >90)
Glucose, Bld: 98 mg/dL (ref 70–99)

## 2011-02-10 LAB — CBC
MCH: 30.6 pg (ref 26.0–34.0)
MCV: 91.8 fL (ref 78.0–100.0)
Platelets: 512 10*3/uL — ABNORMAL HIGH (ref 150–400)
RBC: 2.55 MIL/uL — ABNORMAL LOW (ref 4.22–5.81)

## 2011-02-10 MED ORDER — DEFEROXAMINE MESYLATE 2 G IJ SOLR
2000.0000 mg | Freq: Every day | INTRAMUSCULAR | Status: DC
  Administered 2011-02-10: 2000 mg via INTRAVENOUS
  Filled 2011-02-10 (×2): qty 2

## 2011-02-10 MED ORDER — DIPHENHYDRAMINE HCL 12.5 MG/5ML PO ELIX: 12.5000 mg | ORAL_SOLUTION | ORAL | Status: DC | PRN

## 2011-02-10 MED ORDER — IBUPROFEN 800 MG PO TABS
800.0000 mg | ORAL_TABLET | Freq: Three times a day (TID) | ORAL | Status: AC
  Administered 2011-02-10 – 2011-02-12 (×6): 800 mg via ORAL
  Filled 2011-02-10 (×6): qty 1

## 2011-02-10 MED ORDER — DIPHENHYDRAMINE HCL 50 MG/ML IJ SOLN
25.0000 mg | INTRAMUSCULAR | Status: DC | PRN
  Administered 2011-02-11 – 2011-02-21 (×48): 25 mg via INTRAVENOUS
  Filled 2011-02-10 (×49): qty 1

## 2011-02-10 MED ORDER — DEXTROSE-NACL 5-0.45 % IV SOLN
INTRAVENOUS | Status: DC
  Administered 2011-02-10: 22:00:00 via INTRAVENOUS
  Administered 2011-02-11: 10000 mL via INTRAVENOUS
  Administered 2011-02-12 – 2011-02-14 (×2): 1000 mL via INTRAVENOUS
  Administered 2011-02-15 – 2011-02-17 (×4): via INTRAVENOUS
  Administered 2011-02-18: 1000 mL via INTRAVENOUS
  Administered 2011-02-19: 20:00:00 via INTRAVENOUS
  Administered 2011-02-20: 1000 mL via INTRAVENOUS
  Administered 2011-02-20: 09:00:00 via INTRAVENOUS

## 2011-02-10 MED ORDER — MAGNESIUM OXIDE 400 MG PO TABS
400.0000 mg | ORAL_TABLET | Freq: Two times a day (BID) | ORAL | Status: DC
  Administered 2011-02-10 – 2011-02-11 (×2): 400 mg via ORAL
  Filled 2011-02-10 (×3): qty 1

## 2011-02-10 MED ORDER — HYDROMORPHONE 0.3 MG/ML IV SOLN
INTRAVENOUS | Status: DC | PRN
  Administered 2011-02-10: 22:00:00 via INTRAVENOUS
  Administered 2011-02-10: 3.72 mg via INTRAVENOUS
  Administered 2011-02-11: 2.07 mg via INTRAVENOUS
  Administered 2011-02-11: 3.48 mg via INTRAVENOUS
  Administered 2011-02-11 (×3): via INTRAVENOUS
  Administered 2011-02-11: 6.65 mg via INTRAVENOUS
  Administered 2011-02-11: 7.56 mg via INTRAVENOUS
  Administered 2011-02-12 (×2): via INTRAVENOUS
  Administered 2011-02-12: 7.61 mL via INTRAVENOUS
  Administered 2011-02-12: 8.87 mL via INTRAVENOUS
  Administered 2011-02-12 (×2): via INTRAVENOUS
  Filled 2011-02-10 (×11): qty 25

## 2011-02-10 NOTE — Progress Notes (Signed)
Subjective: The patient was seen on rounds today.  The patient was sitting quietly in his bed watching television.  The patient is complaining of pain 8/10 in his back, left flank area and bilateral wrist area.  The patient also stated that his current pain regimen is not working for him.  Explained to the patient that  I will adjust his pain medication.   The patient also stated that he is severely pruritic.  I will adjust the patient's pruritis medication.   No other nursing or patient concerns.   Objective: Vital signs in last 24 hours: Blood pressure 129/73, pulse 81, temperature 98.6 F (37 C), temperature source Oral, resp. rate 18, height 6' (1.829 m), weight 178 lb 2.1 oz (80.8 kg), SpO2 99.00%.  General Appearance: Alert, cooperative and moderate distress Head: Normocephalic, without obvious abnormality, atraumatic Eyes: PERRLA, EOMI, the patient had slight strabismus, scleral icterus Nose: Nares normal, septum midline, mucosa normal, no drainage or sinus tenderness. Throat: Lips, mucosa, and tongue normal, teeth and gums normal Back: Symmetric, bilateral CVA tenderness (L >R), diffuse tenderness Resp: Diminished breath sounds bibasilar (LLL > RLL) Cardio: S1, S2 regular, 1/6 systolic murmur GI: Soft, non-tender, normoactive bowel sounds, no masses, no organomegaly Male genitalia: Deferred, the patient denies priapism Extremities: Extremities normal, atraumatic, no cyanosis or edema, negative Homans sign, no sign of DVT Pulses: 2+ and symmetric Skin: Skin color, texture, turgor normal, no rashes or lesions Neurologic: Grossly normal, CN II-XII intact, no focal deficits Psych:  Appropriate affect  Lab Results: Results for orders placed during the hospital encounter of 02/07/11 (from the past 24 hour(s))  CBC     Status: Abnormal   Collection Time   02/10/11  5:00 AM      Component Value Range   WBC 14.4 (*) 4.0 - 10.5 (K/uL)   RBC 2.55 (*) 4.22 - 5.81 (MIL/uL)   Hemoglobin 7.8  (*) 13.0 - 17.0 (g/dL)   HCT 14.7 (*) 82.9 - 52.0 (%)   MCV 91.8  78.0 - 100.0 (fL)   MCH 30.6  26.0 - 34.0 (pg)   MCHC 33.3  30.0 - 36.0 (g/dL)   RDW 56.2 (*) 13.0 - 15.5 (%)   Platelets 512 (*) 150 - 400 (K/uL)  COMPREHENSIVE METABOLIC PANEL     Status: Abnormal   Collection Time   02/10/11  5:00 AM      Component Value Range   Sodium 138  135 - 145 (mEq/L)   Potassium 3.9  3.5 - 5.1 (mEq/L)   Chloride 107  96 - 112 (mEq/L)   CO2 22  19 - 32 (mEq/L)   Glucose, Bld 98  70 - 99 (mg/dL)   BUN 7  6 - 23 (mg/dL)   Creatinine, Ser 8.65  0.50 - 1.35 (mg/dL)   Calcium 9.0  8.4 - 78.4 (mg/dL)   Total Protein 7.8  6.0 - 8.3 (g/dL)   Albumin 4.2  3.5 - 5.2 (g/dL)   AST 27  0 - 37 (U/L)   ALT 28  0 - 53 (U/L)   Alkaline Phosphatase 101  39 - 117 (U/L)   Total Bilirubin 2.6 (*) 0.3 - 1.2 (mg/dL)   GFR calc non Af Amer >90  >90 (mL/min)   GFR calc Af Amer >90  >90 (mL/min)    Studies/Results: No results found.  Medications: I have reviewed the patient's current medications.  Allergies  Allergen Reactions  . Morphine And Related Rash    Current Facility-Administered Medications  Medication  Dose Route Frequency Provider Last Rate Last Dose  . deferoxamine (DESFERAL) 2,000 mg in dextrose 5 % 500 mL infusion  2,000 mg Intravenous QHS Larina Bras, NP      . dextrose 5 %-0.45 % sodium chloride infusion   Intravenous Continuous Larina Bras, NP      . diphenhydrAMINE (BENADRYL) injection 25 mg  25 mg Intravenous Q4H PRN Larina Bras, NP       Or  . diphenhydrAMINE (BENADRYL) 12.5 MG/5ML elixir 12.5 mg  12.5 mg Oral Q4H PRN Larina Bras, NP      . enoxaparin (LOVENOX) injection 40 mg  40 mg Subcutaneous Q24H Christina Rama   40 mg at 02/10/11 2112  . folic acid (FOLVITE) tablet 1 mg  1 mg Oral Daily Christina Rama   1 mg at 02/10/11 1048  . HYDROcodone-acetaminophen (NORCO) 5-325 MG per tablet 1-2 tablet  1-2 tablet Oral Q4H PRN Christina Rama   2 tablet at 02/08/11  0328  . HYDROmorphone (DILAUDID) injection 3 mg  3 mg Intravenous Q3H PRN Willey Blade, MD   3 mg at 02/10/11 2112  . HYDROmorphone (DILAUDID) PCA injection 0.3 mg/mL   Intravenous PRN Larina Bras, NP      . hydroxyurea (HYDREA) capsule 500 mg  500 mg Oral BID Christina Rama   500 mg at 02/10/11 2113  . ibuprofen (ADVIL,MOTRIN) tablet 800 mg  800 mg Oral TID Larina Bras, NP      . magnesium oxide (MAG-OX) tablet 400 mg  400 mg Oral BID Larina Bras, NP      . naloxone [facility]) injection 0.4 mg  0.4 mg Intravenous PRN Willey Blade, MD       And  . sodium chloride 0.9 % injection 9 mL  9 mL Intravenous PRN Willey Blade, MD      . OLANZapine (ZYPREXA) tablet 10 mg  10 mg Oral QHS Christina Rama   10 mg at 02/10/11 2112  . ondansetron (ZOFRAN) injection 4 mg  4 mg Intravenous Q4H PRN Larina Bras, NP   4 mg at 02/08/11 1932  . oxyCODONE (OXYCONTIN) 12 hr tablet 40 mg  40 mg Oral BID Christina Rama   40 mg at 02/10/11 2112  . pantoprazole (PROTONIX) EC tablet 40 mg  40 mg Oral Q1200 Larina Bras, NP   40 mg at 02/10/11 1121  . sodium chloride 0.9 % injection 10 mL  10 mL Intracatheter PRN Willey Blade, MD      . zolpidem [facility]) tablet 10 mg  10 mg Oral QHS PRN Christina Rama   10 mg at 02/09/11 2240   Assessment/Plan: Patient Active Problem List  Diagnoses  . Sickle cell anemia  . Leukocytosis  . Chronic pain  . Hypokalemia  . Metabolic acidosis  . Thrombocytosis  . Hemochromatosis    Sickle Cell Crisis/Chronic Pain/Metabolic Acidosis: The patient will continue pain management, scheduled anti-inflammatory for the next 48 hours,  IV hydration, home medications, GI and DVT prophylaxis.  The patient's magnesium level was on the lower side of normal - will be repleted.    CMP and CBC in the am.  Hemoglobinopathy is pending. Leukocytosis/Thrombocytocis:  Leukocytosis is trending downward and thrombocytosis is trending upward (both may be an acute reaction) - will continue to  monitor. Hypokalemia:  Resolved - will continue to monitor Hemochromatosis:  The patient will receive a Desferal infusion for the next 2 nights and then recheck Ferritin level  Discussed and agreed upon plan of care with the patient.  The plan of care will be adjusted based on the patient's clinical progress.   Larina Bras, NP-C 02/10/2011, 9:43 PM

## 2011-02-11 DIAGNOSIS — D72829 Elevated white blood cell count, unspecified: Secondary | ICD-10-CM | POA: Diagnosis not present

## 2011-02-11 DIAGNOSIS — D5700 Hb-SS disease with crisis, unspecified: Secondary | ICD-10-CM | POA: Diagnosis not present

## 2011-02-11 DIAGNOSIS — D5701 Hb-SS disease with acute chest syndrome: Secondary | ICD-10-CM | POA: Diagnosis not present

## 2011-02-11 DIAGNOSIS — D473 Essential (hemorrhagic) thrombocythemia: Secondary | ICD-10-CM | POA: Diagnosis not present

## 2011-02-11 DIAGNOSIS — E872 Acidosis: Secondary | ICD-10-CM | POA: Diagnosis not present

## 2011-02-11 DIAGNOSIS — E876 Hypokalemia: Secondary | ICD-10-CM | POA: Diagnosis not present

## 2011-02-11 LAB — TYPE AND SCREEN

## 2011-02-11 LAB — COMPREHENSIVE METABOLIC PANEL
ALT: 27 U/L (ref 0–53)
Alkaline Phosphatase: 89 U/L (ref 39–117)
BUN: 7 mg/dL (ref 6–23)
Chloride: 106 mEq/L (ref 96–112)
Creatinine, Ser: 0.61 mg/dL (ref 0.50–1.35)
GFR calc Af Amer: >90 mL/min (ref >90)
GFR calc non Af Amer: >90 mL/min (ref >90)
Total Protein: 7.1 g/dL (ref 6.0–8.3)

## 2011-02-11 LAB — CBC
HCT: 20.9 % — ABNORMAL LOW (ref 39.0–52.0)
MCH: 31.3 pg (ref 26.0–34.0)
MCHC: 34.4 g/dL (ref 30.0–36.0)
MCV: 90.9 fL (ref 78.0–100.0)
Platelets: 464 10*3/uL — ABNORMAL HIGH (ref 150–400)
RDW: 17.3 % — ABNORMAL HIGH (ref 11.5–15.5)

## 2011-02-11 LAB — PREPARE RBC (CROSSMATCH)

## 2011-02-11 MED ORDER — IBUPROFEN 800 MG PO TABS
800.0000 mg | ORAL_TABLET | Freq: Three times a day (TID) | ORAL | Status: DC | PRN
  Administered 2011-02-13: 800 mg via ORAL
  Filled 2011-02-11: qty 1

## 2011-02-11 MED ORDER — DEXTROSE 5 % IV SOLN
2000.0000 mg | Freq: Every day | INTRAVENOUS | Status: AC
  Administered 2011-02-12 (×2): 2000 mg via INTRAVENOUS
  Filled 2011-02-11 (×2): qty 2

## 2011-02-11 MED ORDER — MAGNESIUM OXIDE 400 MG PO TABS
400.0000 mg | ORAL_TABLET | Freq: Two times a day (BID) | ORAL | Status: AC
  Administered 2011-02-11 – 2011-02-12 (×2): 400 mg via ORAL
  Filled 2011-02-11 (×2): qty 1

## 2011-02-11 MED ORDER — HYDROXYZINE HCL 25 MG PO TABS
25.0000 mg | ORAL_TABLET | Freq: Three times a day (TID) | ORAL | Status: DC
  Administered 2011-02-11 – 2011-02-14 (×9): 25 mg via ORAL
  Filled 2011-02-11 (×12): qty 1

## 2011-02-11 NOTE — Progress Notes (Signed)
Subjective: The patient was seen on rounds today.  The patient was sitting quietly in his bed watching television.  The patient is complaining of pain 7/10 in his back, left flank area and bilateral LE (knees).  The patient's bilateral wrist pain has resolved.   The patient continues to be severely pruritic. I will add Vistaril to his medication regimen.  No other nursing or patient concerns.   Objective: Vital signs in last 24 hours: Blood pressure 143/90, pulse 74, temperature 98.8 F (37.1 C), temperature source Oral, resp. rate 20, height 6' (1.829 m), weight 178 lb 2.1 oz (80.8 kg), SpO2 97.00%.  General Appearance: Alert, cooperative and moderate distress Head: Normocephalic, without obvious abnormality, atraumatic Eyes: PERRLA, EOMI, the patient had slight strabismus, scleral icterus Nose: Nares normal, septum midline, mucosa normal, no drainage or sinus tenderness. Throat: Lips, mucosa, and tongue normal, teeth and gums normal Back: Symmetric, bilateral CVA tenderness (L >R), diffuse tenderness Resp: Diminished breath sounds bibasilar (LLL > RLL) Cardio: S1, S2 regular, 1/6 systolic murmur GI: Soft, non-tender, normoactive bowel sounds, no masses, no organomegaly Male Genitalia: Deferred, the patient denies priapism Extremities: Extremities normal, atraumatic, no cyanosis or edema, negative Homans sign, no sign of DVT, painful ROM bilateral LEs Pulses: 2+ and symmetric Skin: Skin color, texture, turgor normal, no rashes or lesions Neurologic: Grossly normal, CN II-XII intact, no focal deficits Psych:  Appropriate affect  Lab Results: Results for orders placed during the hospital encounter of 02/07/11 (from the past 24 hour(s))  CBC     Status: Abnormal   Collection Time   02/11/11  6:00 AM      Component Value Range   WBC 11.0 (*) 4.0 - 10.5 (K/uL)   RBC 2.30 (*) 4.22 - 5.81 (MIL/uL)   Hemoglobin 7.2 (*) 13.0 - 17.0 (g/dL)   HCT 16.1 (*) 09.6 - 52.0 (%)   MCV 90.9  78.0 -  100.0 (fL)   MCH 31.3  26.0 - 34.0 (pg)   MCHC 34.4  30.0 - 36.0 (g/dL)   RDW 04.5 (*) 40.9 - 15.5 (%)   Platelets 464 (*) 150 - 400 (K/uL)  COMPREHENSIVE METABOLIC PANEL     Status: Abnormal   Collection Time   02/11/11  6:00 AM      Component Value Range   Sodium 137  135 - 145 (mEq/L)   Potassium 3.8  3.5 - 5.1 (mEq/L)   Chloride 106  96 - 112 (mEq/L)   CO2 21  19 - 32 (mEq/L)   Glucose, Bld 101 (*) 70 - 99 (mg/dL)   BUN 7  6 - 23 (mg/dL)   Creatinine, Ser 8.11  0.50 - 1.35 (mg/dL)   Calcium 8.6  8.4 - 91.4 (mg/dL)   Total Protein 7.1  6.0 - 8.3 (g/dL)   Albumin 3.9  3.5 - 5.2 (g/dL)   AST 33  0 - 37 (U/L)   ALT 27  0 - 53 (U/L)   Alkaline Phosphatase 89  39 - 117 (U/L)   Total Bilirubin 2.5 (*) 0.3 - 1.2 (mg/dL)   GFR calc non Af Amer >90  >90 (mL/min)   GFR calc Af Amer >90  >90 (mL/min)    Studies/Results: No results found.  Medications: I have reviewed the patient's current medications.  Allergies  Allergen Reactions  . Morphine And Related Rash    Current Facility-Administered Medications  Medication Dose Route Frequency Provider Last Rate Last Dose  . deferoxamine (DESFERAL) 2,000 mg in dextrose 5 % 500  mL infusion  2,000 mg Intravenous QHS Larina Bras, NP      . dextrose 5 %-0.45 % sodium chloride infusion   Intravenous Continuous Larina Bras, NP 75 mL/hr at 02/10/11 2229    . diphenhydrAMINE (BENADRYL) injection 25 mg  25 mg Intravenous Q4H PRN Larina Bras, NP   25 mg at 02/11/11 1427   Or  . diphenhydrAMINE (BENADRYL) 12.5 MG/5ML elixir 12.5 mg  12.5 mg Oral Q4H PRN Larina Bras, NP      . enoxaparin (LOVENOX) injection 40 mg  40 mg Subcutaneous Q24H Christina Rama   40 mg at 02/10/11 2112  . folic acid (FOLVITE) tablet 1 mg  1 mg Oral Daily Christina Rama   1 mg at 02/11/11 1027  . HYDROcodone-acetaminophen (NORCO) 5-325 MG per tablet 1-2 tablet  1-2 tablet Oral Q4H PRN Christina Rama   2 tablet at 02/08/11 0328  . HYDROmorphone  (DILAUDID) injection 3 mg  3 mg Intravenous Q3H PRN Willey Blade, MD   3 mg at 02/11/11 1423  . HYDROmorphone (DILAUDID) PCA injection 0.3 mg/mL   Intravenous PRN Larina Bras, NP      . hydroxyurea (HYDREA) capsule 500 mg  500 mg Oral BID Christina Rama   500 mg at 02/11/11 1027  . hydrOXYzine (ATARAX/VISTARIL) tablet 25 mg  25 mg Oral TID Larina Bras, NP      . ibuprofen (ADVIL,MOTRIN) tablet 800 mg  800 mg Oral TID Larina Bras, NP   800 mg at 02/11/11 1027  . ibuprofen (ADVIL,MOTRIN) tablet 800 mg  800 mg Oral Q8H PRN Larina Bras, NP      . magnesium oxide (MAG-OX) tablet 400 mg  400 mg Oral BID Larina Bras, NP      . naloxone [facility]) injection 0.4 mg  0.4 mg Intravenous PRN Willey Blade, MD       And  . sodium chloride 0.9 % injection 9 mL  9 mL Intravenous PRN Willey Blade, MD      . OLANZapine (ZYPREXA) tablet 10 mg  10 mg Oral QHS Christina Rama   10 mg at 02/10/11 2112  . ondansetron (ZOFRAN) injection 4 mg  4 mg Intravenous Q4H PRN Larina Bras, NP   4 mg at 02/08/11 1932  . oxyCODONE (OXYCONTIN) 12 hr tablet 40 mg  40 mg Oral BID Christina Rama   40 mg at 02/11/11 1027  . pantoprazole (PROTONIX) EC tablet 40 mg  40 mg Oral Q1200 Larina Bras, NP   40 mg at 02/11/11 1128  . sodium chloride 0.9 % injection 10 mL  10 mL Intracatheter PRN Willey Blade, MD      . zolpidem [facility]) tablet 10 mg  10 mg Oral QHS PRN Christina Rama   10 mg at 02/10/11 2240   Assessment/Plan: Patient Active Problem List  Diagnoses  . Sickle cell anemia  . Leukocytosis  . Chronic pain  . Hypokalemia  . Metabolic acidosis  . Thrombocytosis  . Hemochromatosis    Sickle Cell Crisis/Chronic Pain/Metabolic Acidosis: The patient will receive a blood transfusion today.  The patient is in active hemolysis related to his sickle cell crisis. The patient will continue pain management, has a scheduled anti-inflammatory for the next 24 hours and then PRN,  IV hydration, home medications, GI  and DVT prophylaxis.  The patient's magnesium level was on the lower side of normal - will continue to be repleted. CMP, CBC, ProBNP and Mg in the am.  Hemoglobinopathy is still pending. Leukocytosis/Thrombocytocis:  Leukocytosis and  thrombocytosis levels are trending downward.  Both may be an acute reaction - will continue to monitor. Hypokalemia:  Resolved - will continue to monitor Hemochromatosis:  The patient will receive a Desferal infusion for the next 2 nights and then recheck Ferritin level.  Discussed and agreed upon plan of care with the patient.   The plan of care will be adjusted based on the patient's clinical progress.   Larina Bras, NP-C 02/11/2011, 3:55 PM

## 2011-02-12 DIAGNOSIS — E872 Acidosis: Secondary | ICD-10-CM | POA: Diagnosis not present

## 2011-02-12 DIAGNOSIS — D473 Essential (hemorrhagic) thrombocythemia: Secondary | ICD-10-CM | POA: Diagnosis not present

## 2011-02-12 DIAGNOSIS — D5700 Hb-SS disease with crisis, unspecified: Secondary | ICD-10-CM | POA: Diagnosis not present

## 2011-02-12 DIAGNOSIS — D5701 Hb-SS disease with acute chest syndrome: Secondary | ICD-10-CM | POA: Diagnosis not present

## 2011-02-12 DIAGNOSIS — E876 Hypokalemia: Secondary | ICD-10-CM | POA: Diagnosis not present

## 2011-02-12 DIAGNOSIS — M2550 Pain in unspecified joint: Secondary | ICD-10-CM | POA: Diagnosis not present

## 2011-02-12 LAB — CBC
HCT: 23.5 % — ABNORMAL LOW (ref 39.0–52.0)
MCH: 30 pg (ref 26.0–34.0)
MCHC: 33.6 g/dL (ref 30.0–36.0)
RDW: 18.1 % — ABNORMAL HIGH (ref 11.5–15.5)

## 2011-02-12 LAB — COMPREHENSIVE METABOLIC PANEL
Albumin: 4.3 g/dL (ref 3.5–5.2)
Alkaline Phosphatase: 109 U/L (ref 39–117)
BUN: 5 mg/dL — ABNORMAL LOW (ref 6–23)
Calcium: 8.8 mg/dL (ref 8.4–10.5)
Creatinine, Ser: 0.61 mg/dL (ref 0.50–1.35)
Potassium: 3.6 mEq/L (ref 3.5–5.1)
Total Protein: 7.7 g/dL (ref 6.0–8.3)

## 2011-02-12 LAB — PRO B NATRIURETIC PEPTIDE: Pro B Natriuretic peptide (BNP): 51.9 pg/mL (ref 0–125)

## 2011-02-12 LAB — MAGNESIUM: Magnesium: 1.7 mg/dL (ref 1.5–2.5)

## 2011-02-12 MED ORDER — POTASSIUM CHLORIDE CRYS ER 10 MEQ PO TBCR
10.0000 meq | EXTENDED_RELEASE_TABLET | Freq: Two times a day (BID) | ORAL | Status: AC
  Administered 2011-02-12 – 2011-02-14 (×6): 10 meq via ORAL
  Filled 2011-02-12 (×7): qty 1

## 2011-02-12 MED ORDER — HYDROMORPHONE 0.3 MG/ML IV SOLN
INTRAVENOUS | Status: DC
  Administered 2011-02-13: 04:00:00 via INTRAVENOUS
  Administered 2011-02-13: 6.49 mg via INTRAVENOUS
  Administered 2011-02-13: 10.8 mg via INTRAVENOUS
  Administered 2011-02-13: 7.67 mg via INTRAVENOUS
  Administered 2011-02-13: 5.81 mg via INTRAVENOUS
  Administered 2011-02-13: 3.97 mg via INTRAVENOUS
  Administered 2011-02-13: 0.3 mg via INTRAVENOUS
  Administered 2011-02-14: 8.48 mg via INTRAVENOUS
  Administered 2011-02-14 (×2): 0.3 mg via INTRAVENOUS
  Administered 2011-02-14: 3.5 mg via INTRAVENOUS
  Administered 2011-02-14: 10 mg via INTRAVENOUS
  Administered 2011-02-14: 5.97 mg via INTRAVENOUS
  Filled 2011-02-12 (×7): qty 25

## 2011-02-12 NOTE — Progress Notes (Signed)
Subjective:  No new complaint.  Objective:  Vital Signs in the last 24 hours: Temp:  [97.9 F (36.6 C)-99.2 F (37.3 C)] 98.7 F (37.1 C) (01/12 0744) Pulse Rate:  [69-83] 69  (01/12 0744) Cardiac Rhythm:  [-]  Resp:  [16-20] 20  (01/12 0744) BP: (114-143)/(70-90) 114/70 mmHg (01/12 0744) SpO2:  [95 %-100 %] 95 % (01/12 0744)  Physical Exam: BP Readings from Last 1 Encounters:  02/12/11 114/70    Wt Readings from Last 1 Encounters:  02/07/11 80.8 kg (178 lb 2.1 oz)    Weight change:   HEENT: [place]/AT, Eyes-Brown, PERL, EOMI, Conjunctiva-Pale, Sclera-icteric Neck: No JVD, No bruit, Trachea midline. Lungs:  Clear, Bilateral. Cardiac:  Regular rhythm, normal S1 and S2, no S3.  Abdomen:  Soft, non-tender. Extremities:  No edema present. No cyanosis. No clubbing. CNS: AxOx3, Cranial nerves grossly intact, moves all 4 extremities. Right handed. Skin: Warm and dry.   Intake/Output from previous day: 01/11 0701 - 01/12 0700 In: 4902.2 [P.O.:2200; I.V.:1769.7; Blood:332.5; IV Piggyback:500] Out: 1900 [Urine:1900]    Lab Results: BMET    Component Value Date/Time   NA 137 02/12/2011 0500   K 3.6 02/12/2011 0500   CL 106 02/12/2011 0500   CO2 21 02/12/2011 0500   GLUCOSE 97 02/12/2011 0500   BUN 5* 02/12/2011 0500   CREATININE 0.61 02/12/2011 0500   CALCIUM 8.8 02/12/2011 0500   GFRNONAA >90 02/12/2011 0500   GFRAA >90 02/12/2011 0500   CBC    Component Value Date/Time   WBC 12.1* 02/12/2011 0500   RBC 2.63* 02/12/2011 0500   HGB 7.9* 02/12/2011 0500   HCT 23.5* 02/12/2011 0500   PLT 460* 02/12/2011 0500   MCV 89.4 02/12/2011 0500   MCH 30.0 02/12/2011 0500   MCHC 33.6 02/12/2011 0500   RDW 18.1* 02/12/2011 0500   LYMPHSABS 3.6 02/07/2011 0836   MONOABS 2.7* 02/07/2011 0836   EOSABS 0.5 02/07/2011 0836   BASOSABS 0.2* 02/07/2011 0836   CARDIAC ENZYMES Lab Results  Component Value Date   CKTOTAL 28 12/16/2010   CKMB 0.9 12/16/2010   TROPONINI <0.30 12/16/2010     Assessment/Plan:  Patient Active Hospital Problem List: Sickle cell anemia    Assessment: Stable   Plan: Continue medications. Leukocytosis (12/17/2010)   Assessment: Stable Hypokalemia (12/17/2010)   Assessment: Stable Thrombocytosis (02/07/2011)   Assessment: Persist Hemochromatosis (02/10/2011)   Assessment: Stable    LOS: 5 days    Orpah Cobb  MD  02/12/2011, 12:36 PM

## 2011-02-13 DIAGNOSIS — D5700 Hb-SS disease with crisis, unspecified: Secondary | ICD-10-CM | POA: Diagnosis not present

## 2011-02-13 DIAGNOSIS — M2550 Pain in unspecified joint: Secondary | ICD-10-CM | POA: Diagnosis not present

## 2011-02-13 DIAGNOSIS — E872 Acidosis: Secondary | ICD-10-CM | POA: Diagnosis not present

## 2011-02-13 DIAGNOSIS — D5701 Hb-SS disease with acute chest syndrome: Secondary | ICD-10-CM | POA: Diagnosis not present

## 2011-02-13 DIAGNOSIS — D473 Essential (hemorrhagic) thrombocythemia: Secondary | ICD-10-CM | POA: Diagnosis not present

## 2011-02-13 DIAGNOSIS — E876 Hypokalemia: Secondary | ICD-10-CM | POA: Diagnosis not present

## 2011-02-13 LAB — COMPREHENSIVE METABOLIC PANEL
ALT: 51 U/L (ref 0–53)
AST: 57 U/L — ABNORMAL HIGH (ref 0–37)
Alkaline Phosphatase: 104 U/L (ref 39–117)
CO2: 22 mEq/L (ref 19–32)
GFR calc Af Amer: >90 mL/min (ref >90)
GFR calc non Af Amer: >90 mL/min (ref >90)
Glucose, Bld: 124 mg/dL — ABNORMAL HIGH (ref 70–99)
Potassium: 3.8 mEq/L (ref 3.5–5.1)
Sodium: 136 mEq/L (ref 135–145)

## 2011-02-13 LAB — CBC
Hemoglobin: 7.6 g/dL — ABNORMAL LOW (ref 13.0–17.0)
Platelets: 419 10*3/uL — ABNORMAL HIGH (ref 150–400)
RBC: 2.52 MIL/uL — ABNORMAL LOW (ref 4.22–5.81)
WBC: 12 10*3/uL — ABNORMAL HIGH (ref 4.0–10.5)

## 2011-02-13 NOTE — Discharge Summary (Signed)
Physician Discharge Summary  Patient ID: [name] MRN: [id number] DOB/AGE: August 13, 1980 31 y.o.  Admit date: 12/16/2010 Discharge date: 12/23/2010   Discharge Diagnoses:  Principal Problem:  *Sickle cell anemia Active Problems:  Leukocytosis  Chronic pain  Hypokalemia Transient hallucination secondary to adverse medication reaction.  Discharged Condition: improvedl.  Operations/Procedues: exchange transfusion of one unit prbc.   Hospital Course: See admission H&P for details. Patient was initially further evaluated for right-sided chest pain. He did undergo a CT angiogram of his lungs which was negative for pulmonary embolus. He was subsequently admitted to the regular floor for continuing therapy. Patient was placed on IV fluids as well as IV Dilaudid for pain. He was also placed on his home medications well. Because of patient's history of sickle cell lung disease and chest pain he was placed on Zithromax empirically as well. His hypokalemia was corrected. With continued supportive therapy patient made steady progress. His joint pains and chest wall pain gradually became manageable with his IV medication. Patient did require exchange transfusion x1 which did appear to reduce his overall level of pain. Notably during his hospital stay patient was noted to become confused at times at night wandering into other patient's rooms. On review it was noted he did this mostly with high dose Ambien. This was subsequently decreased. Patient did not have recurrent events similar to this. By 11/21 patient was doing well. He was subsequently discharged with plans for further outpatient followup.     Significant Diagnostic Studies: CT angiogram of the chest.  Disposition: Home or Self Care  Discharge Orders    Future Orders Please Complete By Expires   Diet - low sodium heart healthy      Increase activity slowly      No wound care      Call MD for:  severe uncontrolled pain         Discharge Medication List as of 12/23/2010 12:49 PM    START taking these medications   Details  folic acid (FOLVITE) 1 MG tablet Take 1 tablet (1 mg total) by mouth daily., Starting 12/23/2010, Until Fri 12/23/11, Print    OLANZapine (ZYPREXA) 10 MG tablet Take 1 tablet (10 mg total) by mouth at bedtime., Starting 12/23/2010, Until Sat 01/22/11, Print      CONTINUE these medications which have CHANGED   Details  HYDROmorphone (DILAUDID) 4 MG tablet Take 1 tablet (4 mg total) by mouth every 4 (four) hours as needed. For pain, Starting 12/23/2010, Until Discontinued, Print    oxyCODONE (OXYCONTIN) 40 MG 12 hr tablet Take 1 tablet (40 mg total) by mouth every 12 (twelve) hours. pain, Starting 12/23/2010, Until Discontinued, Print      CONTINUE these medications which have NOT CHANGED   Details  hydroxyurea (HYDREA) 500 MG capsule Take 500 mg by mouth 2 (two) times daily. May take with food to minimize GI side effects., Until Discontinued, Historical Med    ibuprofen (ADVIL,MOTRIN) 800 MG tablet Take 800 mg by mouth every 8 (eight) hours as needed. pain, Until Discontinued, Historical Med    zolpidem (AMBIEN) 10 MG tablet Take 10 mg by mouth at bedtime as needed. sleep, Until Discontinued, Historical Med         Signed: [name], [name] 02/13/2011, 3:56 PM

## 2011-02-13 NOTE — Progress Notes (Signed)
Subjective:  Upset over PCA pump not working.  Objective:  Vital Signs in the last 24 hours: Temp:  [97.8 F (36.6 C)-98.2 F (36.8 C)] 98 F (36.7 C) (01/13 0510) Pulse Rate:  [77-87] 77  (01/13 0510) Cardiac Rhythm:  [-]  Resp:  [18-20] 20  (01/13 1213) BP: (124-131)/(77-81) 131/78 mmHg (01/13 0510) SpO2:  [94 %-99 %] 98 % (01/13 1213)  Physical Exam: BP Readings from Last 1 Encounters:  02/13/11 131/78    Wt Readings from Last 1 Encounters:  02/07/11 80.8 kg (178 lb 2.1 oz)    Weight change:   HEENT: [place]/AT, Eyes-Brown, PERL, EOMI, Conjunctiva-Pale pink, Sclera-icteric Neck: No JVD, No bruit, Trachea midline. Lungs:  Clear, Bilateral. Cardiac:  Regular rhythm, normal S1 and S2, no S3.  Abdomen:  Soft, non-tender. Extremities:  No edema present. No cyanosis. No clubbing. CNS: AxOx3, Cranial nerves grossly intact, moves all 4 extremities. Right handed. Skin: Warm and dry.   Intake/Output from previous day: 01/12 0701 - 01/13 0700 In: 3754.5 [P.O.:480; I.V.:2745.8; IV Piggyback:528.7] Out: 500 [Urine:500]    Lab Results: BMET    Component Value Date/Time   NA 136 02/13/2011 0455   K 3.8 02/13/2011 0455   CL 106 02/13/2011 0455   CO2 22 02/13/2011 0455   GLUCOSE 124* 02/13/2011 0455   BUN 5* 02/13/2011 0455   CREATININE 0.60 02/13/2011 0455   CALCIUM 9.0 02/13/2011 0455   GFRNONAA >90 02/13/2011 0455   GFRAA >90 02/13/2011 0455   CBC    Component Value Date/Time   WBC 12.0* 02/13/2011 0455   RBC 2.52* 02/13/2011 0455   HGB 7.6* 02/13/2011 0455   HCT 22.6* 02/13/2011 0455   PLT 419* 02/13/2011 0455   MCV 89.7 02/13/2011 0455   MCH 30.2 02/13/2011 0455   MCHC 33.6 02/13/2011 0455   RDW 18.0* 02/13/2011 0455   LYMPHSABS 3.6 02/07/2011 0836   MONOABS 2.7* 02/07/2011 0836   EOSABS 0.5 02/07/2011 0836   BASOSABS 0.2* 02/07/2011 0836   CARDIAC ENZYMES Lab Results  Component Value Date   CKTOTAL 28 12/16/2010   CKMB 0.9 12/16/2010   TROPONINI <0.30 12/16/2010     Assessment/Plan: Patient Active Hospital Problem List: Sickle cell anemia  Assessment: Stable Plan: Continue medications. Leukocytosis (12/17/2010) Assessment: Stable Hypokalemia (12/17/2010) Assessment: Stable Thrombocytosis (02/07/2011) Assessment: Persist Hemochromatosis (02/10/2011) Assessment: Stable    LOS: 6 days    Orpah Cobb  MD  02/13/2011, 12:16 PM

## 2011-02-14 DIAGNOSIS — D72829 Elevated white blood cell count, unspecified: Secondary | ICD-10-CM | POA: Diagnosis not present

## 2011-02-14 DIAGNOSIS — D5700 Hb-SS disease with crisis, unspecified: Secondary | ICD-10-CM | POA: Diagnosis not present

## 2011-02-14 DIAGNOSIS — E876 Hypokalemia: Secondary | ICD-10-CM | POA: Diagnosis not present

## 2011-02-14 DIAGNOSIS — D5701 Hb-SS disease with acute chest syndrome: Secondary | ICD-10-CM | POA: Diagnosis not present

## 2011-02-14 DIAGNOSIS — D473 Essential (hemorrhagic) thrombocythemia: Secondary | ICD-10-CM | POA: Diagnosis not present

## 2011-02-14 DIAGNOSIS — E872 Acidosis: Secondary | ICD-10-CM | POA: Diagnosis not present

## 2011-02-14 LAB — CBC
HCT: 24.6 % — ABNORMAL LOW (ref 39.0–52.0)
MCH: 29 pg (ref 26.0–34.0)
MCHC: 32.5 g/dL (ref 30.0–36.0)
RDW: 17.8 % — ABNORMAL HIGH (ref 11.5–15.5)

## 2011-02-14 LAB — COMPREHENSIVE METABOLIC PANEL
Albumin: 4.5 g/dL (ref 3.5–5.2)
Alkaline Phosphatase: 115 U/L (ref 39–117)
BUN: 5 mg/dL — ABNORMAL LOW (ref 6–23)
Calcium: 9.2 mg/dL (ref 8.4–10.5)
GFR calc Af Amer: >90 mL/min (ref >90)
Glucose, Bld: 119 mg/dL — ABNORMAL HIGH (ref 70–99)
Potassium: 3.8 mEq/L (ref 3.5–5.1)
Sodium: 137 mEq/L (ref 135–145)
Total Protein: 8.4 g/dL — ABNORMAL HIGH (ref 6.0–8.3)

## 2011-02-14 LAB — HEMOGLOBINOPATHY EVALUATION: Hgb S Quant: 42.4 % — ABNORMAL HIGH

## 2011-02-14 MED ORDER — HYDROMORPHONE HCL PF 1 MG/ML IJ SOLN: 3.0000 mg | INTRAMUSCULAR | Status: DC

## 2011-02-14 MED ORDER — HYDROMORPHONE HCL PF 2 MG/ML IJ SOLN
2.0000 mg | INTRAMUSCULAR | Status: DC | PRN
  Administered 2011-02-15 (×2): 4 mg via INTRAVENOUS
  Filled 2011-02-14 (×9): qty 2

## 2011-02-14 MED ORDER — HYDROMORPHONE HCL PF 2 MG/ML IJ SOLN
2.0000 mg | INTRAMUSCULAR | Status: AC
  Administered 2011-02-14: 2 mg via INTRAVENOUS
  Administered 2011-02-14: 4 mg via INTRAVENOUS
  Administered 2011-02-14: 2 mg via INTRAVENOUS
  Administered 2011-02-14: 4 mg via INTRAVENOUS
  Administered 2011-02-14 (×2): 2 mg via INTRAVENOUS
  Administered 2011-02-15 (×3): 4 mg via INTRAVENOUS
  Administered 2011-02-15: 2 mg via INTRAVENOUS
  Filled 2011-02-14 (×3): qty 2

## 2011-02-14 MED ORDER — LEVALBUTEROL HCL 0.63 MG/3ML IN NEBU
0.6300 mg | INHALATION_SOLUTION | Freq: Three times a day (TID) | RESPIRATORY_TRACT | Status: AC
  Administered 2011-02-14: 0.63 mg via RESPIRATORY_TRACT
  Filled 2011-02-14 (×5): qty 3

## 2011-02-14 MED ORDER — HYDROMORPHONE HCL PF 2 MG/ML IJ SOLN: 2.0000 mg | INTRAMUSCULAR | Status: DC | PRN

## 2011-02-14 MED ORDER — KETOROLAC TROMETHAMINE 30 MG/ML IJ SOLN
30.0000 mg | Freq: Four times a day (QID) | INTRAMUSCULAR | Status: AC
  Administered 2011-02-14 – 2011-02-15 (×4): 30 mg via INTRAVENOUS
  Filled 2011-02-14 (×4): qty 1

## 2011-02-14 NOTE — Progress Notes (Signed)
Subjective: The patient was seen on rounds today.  The patient was sitting quietly in his bed watching television.  The patient is complaining of pain 7/10 in his back and bilateral flank area.  The patient has had inconsistent pain medication through his PCA pump due to limited floor stock. The patient states that his pain has been so bad at times, it hurts to take a deep breath.  IS usage has suffered as a consequence.  PCA will be discontinued at this time as the stocking issues continue today.  Will schedule the patient's pain medication for the next 24 hours for better pain control.   No other nursing or patient concerns.   Objective: Vital signs in last 24 hours: Blood pressure 132/85, pulse 88, temperature 99 F (37.2 C), temperature source Oral, resp. rate 18, height 6' (1.829 m), weight 178 lb 2.1 oz (80.8 kg), SpO2 96.00%.  General Appearance: Alert, cooperative and moderate distress Head: Normocephalic, without obvious abnormality, atraumatic Eyes: PERRLA, EOMI, the patient had slight strabismus, scleral icterus Nose: Nares normal, septum midline, mucosa normal, no drainage or sinus tenderness. Throat: Lips, mucosa, and tongue normal, teeth and gums normal Back: Symmetric, bilateral CVA tenderness, diffuse tenderness Resp: Diminished breath sounds bibasilar (LLL > RLL) Cardio: S1, S2 regular, no murmurs/clicks/rubs/gallops GI: Soft, non-tender, normoactive bowel sounds, no masses, no organomegaly Male Genitalia: Deferred, the patient denies priapism Extremities: Extremities normal, atraumatic, no cyanosis or edema, negative Homans sign, no sign of DVT, painful ROM bilateral LEs Pulses: 2+ and symmetric Skin: Skin color, texture, turgor normal, no rashes or lesions Neurologic: Grossly normal, CN II-XII intact, no focal deficits Psych:  Appropriate affect  Lab Results: Results for orders placed during the hospital encounter of 02/07/11 (from the past 24 hour(s))  CBC     Status:  Abnormal   Collection Time   02/14/11  6:40 AM      Component Value Range   WBC 16.4 (*) 4.0 - 10.5 (K/uL)   RBC 2.76 (*) 4.22 - 5.81 (MIL/uL)   Hemoglobin 8.0 (*) 13.0 - 17.0 (g/dL)   HCT 40.9 (*) 81.1 - 52.0 (%)   MCV 89.1  78.0 - 100.0 (fL)   MCH 29.0  26.0 - 34.0 (pg)   MCHC 32.5  30.0 - 36.0 (g/dL)   RDW 91.4 (*) 78.2 - 15.5 (%)   Platelets 462 (*) 150 - 400 (K/uL)  COMPREHENSIVE METABOLIC PANEL     Status: Abnormal   Collection Time   02/14/11  6:40 AM      Component Value Range   Sodium 137  135 - 145 (mEq/L)   Potassium 3.8  3.5 - 5.1 (mEq/L)   Chloride 104  96 - 112 (mEq/L)   CO2 21  19 - 32 (mEq/L)   Glucose, Bld 119 (*) 70 - 99 (mg/dL)   BUN 5 (*) 6 - 23 (mg/dL)   Creatinine, Ser 9.56  0.50 - 1.35 (mg/dL)   Calcium 9.2  8.4 - 21.3 (mg/dL)   Total Protein 8.4 (*) 6.0 - 8.3 (g/dL)   Albumin 4.5  3.5 - 5.2 (g/dL)   AST 69 (*) 0 - 37 (U/L)   ALT 66 (*) 0 - 53 (U/L)   Alkaline Phosphatase 115  39 - 117 (U/L)   Total Bilirubin 3.3 (*) 0.3 - 1.2 (mg/dL)   GFR calc non Af Amer >90  >90 (mL/min)   GFR calc Af Amer >90  >90 (mL/min)    Studies/Results: No results found.  Medications: I  have reviewed the patient's current medications.  Allergies  Allergen Reactions  . Morphine And Related Rash    Current Facility-Administered Medications  Medication Dose Route Frequency Provider Last Rate Last Dose  . dextrose 5 %-0.45 % sodium chloride infusion   Intravenous Continuous Larina Bras, NP 75 mL/hr at 02/14/11 0111 1,000 mL at 02/14/11 0111  . diphenhydrAMINE (BENADRYL) injection 25 mg  25 mg Intravenous Q4H PRN Larina Bras, NP   25 mg at 02/14/11 1751   Or  . diphenhydrAMINE (BENADRYL) 12.5 MG/5ML elixir 12.5 mg  12.5 mg Oral Q4H PRN Larina Bras, NP      . enoxaparin (LOVENOX) injection 40 mg  40 mg Subcutaneous Q24H Christina Rama   40 mg at 02/13/11 2111  . folic acid (FOLVITE) tablet 1 mg  1 mg Oral Daily Christina Rama   1 mg at 02/14/11 1056  .  HYDROcodone-acetaminophen (NORCO) 5-325 MG per tablet 1-2 tablet  1-2 tablet Oral Q4H PRN Christina Rama   2 tablet at 02/08/11 0328  . HYDROmorphone (DILAUDID) injection 2-4 mg  2-4 mg Intravenous Q2H PRN Larina Bras, NP      . HYDROmorphone (DILAUDID) injection 2-4 mg  2-4 mg Intravenous Q2H Larina Bras, NP   4 mg at 02/14/11 1752  . hydroxyurea (HYDREA) capsule 500 mg  500 mg Oral BID Christina Rama   500 mg at 02/14/11 1057  . ibuprofen (ADVIL,MOTRIN) tablet 800 mg  800 mg Oral Q8H PRN Larina Bras, NP   800 mg at 02/13/11 1031  . ketorolac (TORADOL) 30 MG/ML injection 30 mg  30 mg Intravenous Q6H Larina Bras, NP   30 mg at 02/14/11 1756  . levalbuterol (XOPENEX) nebulizer solution 0.63 mg  0.63 mg Nebulization Q8H Larina Bras, NP   0.63 mg at 02/14/11 1520  . naloxone [facility]) injection 0.4 mg  0.4 mg Intravenous PRN Willey Blade, MD       And  . sodium chloride 0.9 % injection 9 mL  9 mL Intravenous PRN Willey Blade, MD      . OLANZapine (ZYPREXA) tablet 10 mg  10 mg Oral QHS Christina Rama   10 mg at 02/13/11 2109  . ondansetron (ZOFRAN) injection 4 mg  4 mg Intravenous Q4H PRN Larina Bras, NP   4 mg at 02/08/11 1932  . oxyCODONE (OXYCONTIN) 12 hr tablet 40 mg  40 mg Oral BID Christina Rama   40 mg at 02/14/11 1056  . pantoprazole (PROTONIX) EC tablet 40 mg  40 mg Oral Q1200 Larina Bras, NP   40 mg at 02/14/11 1509  . potassium chloride (K-DUR,KLOR-CON) CR tablet 10 mEq  10 mEq Oral BID Ricki Rodriguez, MD   10 mEq at 02/14/11 1056  . sodium chloride 0.9 % injection 10 mL  10 mL Intracatheter PRN Willey Blade, MD      . zolpidem [facility]) tablet 10 mg  10 mg Oral QHS PRN Christina Rama   10 mg at 02/13/11 2111    Assessment/Plan: Patient Active Problem List  Diagnoses  . Sickle cell anemia  . Leukocytosis  . Chronic pain  . Hypokalemia  . Metabolic acidosis  . Thrombocytosis  . Hemochromatosis    Sickle Cell Crisis/Chronic Pain/Metabolic Acidosis:   The patient continues to be in active hemolysis related to his sickle cell crisis. The patient will continue pain management, has a scheduled anti-inflammatory for the next 24 hours,  IV hydration, home medications, GI and DVT prophylaxis.   CMP, CBC, ProBNP and Mg  in the am.   Leukocytosis/Thrombocytocis:  Leukocytosis and thrombocytosis levels are elevated.  Both may be an acute reaction - will continue to monitor. Hypokalemia:  The patient has potassium repletion scheduled for 2 more doses - will continue to monitor Hemochromatosis:  Ferritin level is pending.  Discussed and agreed upon plan of care with the patient.   The plan of care will be adjusted based on the patient's clinical progress.   Larina Bras, NP-C 02/14/2011, 6:48 PM

## 2011-02-15 DIAGNOSIS — D5701 Hb-SS disease with acute chest syndrome: Secondary | ICD-10-CM | POA: Diagnosis not present

## 2011-02-15 DIAGNOSIS — D5700 Hb-SS disease with crisis, unspecified: Secondary | ICD-10-CM | POA: Diagnosis not present

## 2011-02-15 DIAGNOSIS — D473 Essential (hemorrhagic) thrombocythemia: Secondary | ICD-10-CM | POA: Diagnosis not present

## 2011-02-15 DIAGNOSIS — E876 Hypokalemia: Secondary | ICD-10-CM | POA: Diagnosis not present

## 2011-02-15 DIAGNOSIS — E872 Acidosis: Secondary | ICD-10-CM | POA: Diagnosis not present

## 2011-02-15 DIAGNOSIS — D72829 Elevated white blood cell count, unspecified: Secondary | ICD-10-CM | POA: Diagnosis not present

## 2011-02-15 LAB — TYPE AND SCREEN
ABO/RH(D): O POS
Antibody Screen: NEGATIVE
Unit division: 0

## 2011-02-15 LAB — CBC
MCH: 30.7 pg (ref 26.0–34.0)
MCHC: 34.3 g/dL (ref 30.0–36.0)
Platelets: 391 10*3/uL (ref 150–400)

## 2011-02-15 LAB — COMPREHENSIVE METABOLIC PANEL
ALT: 67 U/L — ABNORMAL HIGH (ref 0–53)
AST: 71 U/L — ABNORMAL HIGH (ref 0–37)
Calcium: 8.6 mg/dL (ref 8.4–10.5)
GFR calc Af Amer: >90 mL/min (ref >90)
Sodium: 138 mEq/L (ref 135–145)
Total Protein: 7 g/dL (ref 6.0–8.3)

## 2011-02-15 MED ORDER — KETOROLAC TROMETHAMINE 30 MG/ML IJ SOLN
30.0000 mg | Freq: Four times a day (QID) | INTRAMUSCULAR | Status: AC
  Administered 2011-02-15 – 2011-02-16 (×4): 30 mg via INTRAVENOUS
  Filled 2011-02-15 (×5): qty 1

## 2011-02-15 MED ORDER — HYDROMORPHONE HCL PF 2 MG/ML IJ SOLN
2.0000 mg | INTRAMUSCULAR | Status: DC
  Administered 2011-02-15 – 2011-02-17 (×21): 4 mg via INTRAVENOUS
  Administered 2011-02-17: 2 mg via INTRAVENOUS
  Administered 2011-02-17 (×3): 4 mg via INTRAVENOUS
  Administered 2011-02-18: 2 mg via INTRAVENOUS
  Administered 2011-02-18: 4 mg via INTRAVENOUS
  Administered 2011-02-18: 2 mg via INTRAVENOUS
  Administered 2011-02-18 (×3): 4 mg via INTRAVENOUS
  Administered 2011-02-18 (×3): 2 mg via INTRAVENOUS
  Administered 2011-02-18 – 2011-02-21 (×29): 4 mg via INTRAVENOUS
  Filled 2011-02-15 (×34): qty 2
  Filled 2011-02-15: qty 1
  Filled 2011-02-15 (×27): qty 2

## 2011-02-15 MED ORDER — DEXTROSE 5 % IV SOLN
2000.0000 mg | Freq: Every day | INTRAVENOUS | Status: AC
  Administered 2011-02-16: 2000 mg via INTRAVENOUS
  Filled 2011-02-15: qty 2

## 2011-02-15 MED ORDER — MAGNESIUM OXIDE 400 MG PO TABS
400.0000 mg | ORAL_TABLET | Freq: Two times a day (BID) | ORAL | Status: AC
  Administered 2011-02-15 – 2011-02-16 (×2): 400 mg via ORAL
  Filled 2011-02-15 (×2): qty 1

## 2011-02-15 NOTE — Progress Notes (Signed)
Subjective: The patient was seen on rounds today.  The patient was sitting quietly in his bed watching television and talking on the telephone.  The patient rates his pain as 5-6/10 today and states that his pain is much better when his medications are scheduled.  The patient is still experiencing pain in his back area.  Will continued scheduling the patient's pain medication for better pain control.  The patient will also receive a blood transfusion today.  No other nursing or patient concerns.   Objective: Vital signs in last 24 hours: Blood pressure 121/71, pulse 79, temperature 98.8 F (37.1 C), temperature source Oral, resp. rate 20, height 6' (1.829 m), weight 178 lb 2.1 oz (80.8 kg), SpO2 96.00%.  General Appearance: Alert, cooperative, mild distress Head: Normocephalic, without obvious abnormality, atraumatic Eyes: PERRLA, EOMI, the patient had slight strabismus, scleral icterus Nose: Nares normal, septum midline, mucosa normal, no drainage or sinus tenderness. Throat: Lips, mucosa, and tongue normal, teeth and gums normal Back: Symmetric, bilateral CVA tenderness, diffuse tenderness Resp: CTA, Diminished bibasilar breath sounds, no wheezes/rales/rhonchi Cardio: S1, S2 regular, no murmurs/clicks/rubs/gallops GI: Soft, non-tender, normoactive bowel sounds, no masses, no organomegaly Male Genitalia: Deferred, the patient denies priapism Extremities: Extremities normal, atraumatic, no cyanosis or edema, negative Homans sign, no sign of DVT Pulses: 2+ and symmetric Skin: Skin color, texture, turgor normal, no rashes or lesions Neurologic: Grossly normal, CN II-XII intact, no focal deficits Psych:  Appropriate affect  Lab Results: Results for orders placed during the hospital encounter of 02/07/11 (from the past 24 hour(s))  CBC     Status: Abnormal   Collection Time   02/15/11  6:00 AM      Component Value Range   WBC 12.6 (*) 4.0 - 10.5 (K/uL)   RBC 2.38 (*) 4.22 - 5.81 (MIL/uL)     Hemoglobin 7.3 (*) 13.0 - 17.0 (g/dL)   HCT 40.9 (*) 81.1 - 52.0 (%)   MCV 89.5  78.0 - 100.0 (fL)   MCH 30.7  26.0 - 34.0 (pg)   MCHC 34.3  30.0 - 36.0 (g/dL)   RDW 91.4 (*) 78.2 - 15.5 (%)   Platelets 391  150 - 400 (K/uL)  COMPREHENSIVE METABOLIC PANEL     Status: Abnormal   Collection Time   02/15/11  6:00 AM      Component Value Range   Sodium 138  135 - 145 (mEq/L)   Potassium 3.8  3.5 - 5.1 (mEq/L)   Chloride 107  96 - 112 (mEq/L)   CO2 22  19 - 32 (mEq/L)   Glucose, Bld 117 (*) 70 - 99 (mg/dL)   BUN 6  6 - 23 (mg/dL)   Creatinine, Ser 9.56  0.50 - 1.35 (mg/dL)   Calcium 8.6  8.4 - 21.3 (mg/dL)   Total Protein 7.0  6.0 - 8.3 (g/dL)   Albumin 3.7  3.5 - 5.2 (g/dL)   AST 71 (*) 0 - 37 (U/L)   ALT 67 (*) 0 - 53 (U/L)   Alkaline Phosphatase 113  39 - 117 (U/L)   Total Bilirubin 2.9 (*) 0.3 - 1.2 (mg/dL)   GFR calc non Af Amer >90  >90 (mL/min)   GFR calc Af Amer >90  >90 (mL/min)  MAGNESIUM     Status: Normal   Collection Time   02/15/11  6:00 AM      Component Value Range   Magnesium 1.7  1.5 - 2.5 (mg/dL)  PRO B NATRIURETIC PEPTIDE  Status: Normal   Collection Time   02/15/11  6:00 AM      Component Value Range   Pro B Natriuretic peptide (BNP) 14.1  0 - 125 (pg/mL)    Studies/Results: No results found.  Medications: I have reviewed the patient's current medications.  Allergies  Allergen Reactions  . Morphine And Related Rash    Current Facility-Administered Medications  Medication Dose Route Frequency Provider Last Rate Last Dose  . deferoxamine (DESFERAL) 2,000 mg in dextrose 5 % 500 mL infusion  2,000 mg Intravenous QHS Larina Bras, NP      . dextrose 5 %-0.45 % sodium chloride infusion   Intravenous Continuous Larina Bras, NP 75 mL/hr at 02/15/11 0800    . diphenhydrAMINE (BENADRYL) injection 25 mg  25 mg Intravenous Q4H PRN Larina Bras, NP   25 mg at 02/15/11 1504   Or  . diphenhydrAMINE (BENADRYL) 12.5 MG/5ML elixir 12.5 mg  12.5  mg Oral Q4H PRN Larina Bras, NP      . enoxaparin (LOVENOX) injection 40 mg  40 mg Subcutaneous Q24H Christina Rama   40 mg at 02/14/11 2135  . folic acid (FOLVITE) tablet 1 mg  1 mg Oral Daily Christina Rama   1 mg at 02/15/11 1005  . HYDROcodone-acetaminophen (NORCO) 5-325 MG per tablet 1-2 tablet  1-2 tablet Oral Q4H PRN Christina Rama   2 tablet at 02/08/11 0328  . HYDROmorphone (DILAUDID) injection 2-4 mg  2-4 mg Intravenous Q2H Larina Bras, NP   4 mg at 02/15/11 1056  . HYDROmorphone (DILAUDID) injection 2-4 mg  2-4 mg Intravenous Q2H Larina Bras, NP      . hydroxyurea (HYDREA) capsule 500 mg  500 mg Oral BID Christina Rama   500 mg at 02/15/11 1005  . ibuprofen (ADVIL,MOTRIN) tablet 800 mg  800 mg Oral Q8H PRN Larina Bras, NP   800 mg at 02/13/11 1031  . ketorolac (TORADOL) 30 MG/ML injection 30 mg  30 mg Intravenous Q6H Larina Bras, NP   30 mg at 02/15/11 0558  . levalbuterol (XOPENEX) nebulizer solution 0.63 mg  0.63 mg Nebulization Q8H Larina Bras, NP   0.63 mg at 02/14/11 1520  . magnesium oxide (MAG-OX) tablet 400 mg  400 mg Oral BID Larina Bras, NP      . naloxone [facility]) injection 0.4 mg  0.4 mg Intravenous PRN Willey Blade, MD       And  . sodium chloride 0.9 % injection 9 mL  9 mL Intravenous PRN Willey Blade, MD      . OLANZapine (ZYPREXA) tablet 10 mg  10 mg Oral QHS Christina Rama   10 mg at 02/14/11 2134  . ondansetron (ZOFRAN) injection 4 mg  4 mg Intravenous Q4H PRN Larina Bras, NP   4 mg at 02/08/11 1932  . oxyCODONE (OXYCONTIN) 12 hr tablet 40 mg  40 mg Oral BID Christina Rama   40 mg at 02/15/11 1005  . pantoprazole (PROTONIX) EC tablet 40 mg  40 mg Oral Q1200 Larina Bras, NP   40 mg at 02/15/11 1200  . potassium chloride (K-DUR,KLOR-CON) CR tablet 10 mEq  10 mEq Oral BID Ricki Rodriguez, MD   10 mEq at 02/14/11 2135  . sodium chloride 0.9 % injection 10 mL  10 mL Intracatheter PRN Willey Blade, MD      . zolpidem [facility])  tablet 10 mg  10 mg Oral QHS PRN Christina Rama   10 mg at 02/14/11 2340  . DISCONTD: HYDROmorphone (DILAUDID)  injection 2-4 mg  2-4 mg Intravenous Q2H PRN Larina Bras, NP   4 mg at 02/15/11 1504     Assessment/Plan: Patient Active Problem List  Diagnoses  . Sickle cell anemia  . Leukocytosis  . Chronic pain  . Hypokalemia  . Metabolic acidosis  . Thrombocytosis  . Hemochromatosis    Sickle Cell Crisis/Chronic Pain/Metabolic Acidosis:  The patient continues to be in active hemolysis related to his sickle cell crisis.  The patient will receive a blood transfusion today. The patient will continue pain management, has a scheduled anti-inflammatory for the next 24 hours,  IV hydration, home medications, GI and DVT prophylaxis.   CMP and CBC in the am.   Leukocytosis/Thrombocytocis:  Leukocytosis persists but trending downward.  Thrombocytosis is WNL - will continue to monitor. Hypokalemia:  Potassium level is WNL - will continue to monitor Hemochromatosis:  The patient will receive a Desferal infusion tonight after blood transfusion.  Recheck Ferritin level in the am.  Discussed and agreed upon plan of care with the patient.   The plan of care will be adjusted based on the patient's clinical progress.   Larina Bras, NP-C 02/15/2011, 4:21 PM

## 2011-02-16 DIAGNOSIS — D5700 Hb-SS disease with crisis, unspecified: Secondary | ICD-10-CM | POA: Diagnosis not present

## 2011-02-16 DIAGNOSIS — D5701 Hb-SS disease with acute chest syndrome: Secondary | ICD-10-CM | POA: Diagnosis not present

## 2011-02-16 DIAGNOSIS — D473 Essential (hemorrhagic) thrombocythemia: Secondary | ICD-10-CM | POA: Diagnosis not present

## 2011-02-16 DIAGNOSIS — E876 Hypokalemia: Secondary | ICD-10-CM | POA: Diagnosis not present

## 2011-02-16 DIAGNOSIS — D72829 Elevated white blood cell count, unspecified: Secondary | ICD-10-CM | POA: Diagnosis not present

## 2011-02-16 DIAGNOSIS — E872 Acidosis: Secondary | ICD-10-CM | POA: Diagnosis not present

## 2011-02-16 LAB — CBC
HCT: 19.2 % — ABNORMAL LOW (ref 39.0–52.0)
MCH: 30.6 pg (ref 26.0–34.0)
MCV: 88.9 fL (ref 78.0–100.0)
Platelets: 308 10*3/uL (ref 150–400)
RBC: 2.16 MIL/uL — ABNORMAL LOW (ref 4.22–5.81)

## 2011-02-16 LAB — HEMOGLOBIN AND HEMATOCRIT, BLOOD
HCT: 22.7 % — ABNORMAL LOW (ref 39.0–52.0)
Hemoglobin: 7.5 g/dL — ABNORMAL LOW (ref 13.0–17.0)

## 2011-02-16 LAB — COMPREHENSIVE METABOLIC PANEL
BUN: 6 mg/dL (ref 6–23)
CO2: 23 mEq/L (ref 19–32)
Calcium: 8.7 mg/dL (ref 8.4–10.5)
Creatinine, Ser: 0.69 mg/dL (ref 0.50–1.35)
GFR calc Af Amer: >90 mL/min (ref >90)
GFR calc non Af Amer: >90 mL/min (ref >90)
Glucose, Bld: 111 mg/dL — ABNORMAL HIGH (ref 70–99)

## 2011-02-16 LAB — FERRITIN: Ferritin: 2043 ng/mL — ABNORMAL HIGH (ref 22–322)

## 2011-02-16 NOTE — Progress Notes (Signed)
Subjective:  Patient reports he's feeling better today than yesterday. His pain has decreased down to 6/10. This is mainly in his left side more than the right. He has occasional nonproductive cough. Denies any fever or night sweats. His hemoglobin was low today. He felt a mild left-sided headache otherwise noted no increased fatigue or lightheadedness. Appetite is improving. Patient worried about the upcoming changing weather as to whether this may exacerbate his pain.   Allergies  Allergen Reactions  . Morphine And Related Rash   Current Facility-Administered Medications  Medication Dose Route Frequency Provider Last Rate Last Dose  . deferoxamine (DESFERAL) 2,000 mg in dextrose 5 % 500 mL infusion  2,000 mg Intravenous QHS Larina Bras, NP 125 mL/hr at 02/16/11 0037 2,000 mg at 02/16/11 0037  . dextrose 5 %-0.45 % sodium chloride infusion   Intravenous Continuous Larina Bras, NP 75 mL/hr at 02/16/11 1415    . diphenhydrAMINE (BENADRYL) injection 25 mg  25 mg Intravenous Q4H PRN Larina Bras, NP   25 mg at 02/16/11 1946   Or  . diphenhydrAMINE (BENADRYL) 12.5 MG/5ML elixir 12.5 mg  12.5 mg Oral Q4H PRN Larina Bras, NP      . enoxaparin (LOVENOX) injection 40 mg  40 mg Subcutaneous Q24H [name], MD   40 mg at 02/15/11 2244  . folic acid (FOLVITE) tablet 1 mg  1 mg Oral Daily [name], MD   1 mg at 02/16/11 1104  . HYDROcodone-acetaminophen (NORCO) 5-325 MG per tablet 1-2 tablet  1-2 tablet Oral Q4H PRN [name], MD   2 tablet at 02/08/11 0328  . HYDROmorphone (DILAUDID) injection 2-4 mg  2-4 mg Intravenous Q2H Larina Bras, NP   4 mg at 02/16/11 1943  . hydroxyurea (HYDREA) capsule 500 mg  500 mg Oral BID [name], MD   500 mg at 02/16/11 1103  . ibuprofen (ADVIL,MOTRIN) tablet 800 mg  800 mg Oral Q8H PRN Larina Bras, NP   800 mg at 02/13/11 1031  . ketorolac (TORADOL) 30 MG/ML injection 30 mg  30 mg Intravenous Q6H Larina Bras,  NP   30 mg at 02/16/11 1258  . magnesium oxide (MAG-OX) tablet 400 mg  400 mg Oral BID Larina Bras, NP   400 mg at 02/16/11 1104  . naloxone (NARCAN) injection 0.4 mg  0.4 mg Intravenous PRN Willey Blade, MD       And  . sodium chloride 0.9 % injection 9 mL  9 mL Intravenous PRN Willey Blade, MD      . OLANZapine (ZYPREXA) tablet 10 mg  10 mg Oral QHS [name], MD   10 mg at 02/15/11 2245  . ondansetron (ZOFRAN) injection 4 mg  4 mg Intravenous Q4H PRN Larina Bras, NP   4 mg at 02/16/11 1235  . oxyCODONE (OXYCONTIN) 12 hr tablet 40 mg  40 mg Oral BID [name], MD   40 mg at 02/16/11 1104  . pantoprazole (PROTONIX) EC tablet 40 mg  40 mg Oral Q1200 Larina Bras, NP   40 mg at 02/16/11 1258  . sodium chloride 0.9 % injection 10 mL  10 mL Intracatheter PRN Willey Blade, MD      . zolpidem [facility]) tablet 10 mg  10 mg Oral QHS PRN [name], MD   10 mg at 02/15/11 2243    Objective: Blood pressure 116/75, pulse 80, temperature 98.5 F (36.9 C), temperature source Oral, resp. rate 18, height 6' (1.829 m), weight 178 lb 2.1 oz (80.8 kg),  SpO2 99.00%.  Well-developed well-nourished black male in no acute distress. HEENT: No sinus tenderness. NECK: No posterior cervical nodes. LUNGS: Clear to auscultation. CV: Normal S1, S2 without S3. Mild costochondral tenderness on the left versus right. ABD: No epigastric tenderness. MSK: Negative Homans. No edema. NEURO: Intact.  Lab results: Results for orders placed during the hospital encounter of 02/07/11 (from the past 48 hour(s))  CBC     Status: Abnormal   Collection Time   02/15/11  6:00 AM      Component Value Range Comment   WBC 12.6 (*) 4.0 - 10.5 (K/uL)    RBC 2.38 (*) 4.22 - 5.81 (MIL/uL)    Hemoglobin 7.3 (*) 13.0 - 17.0 (g/dL)    HCT 81.1 (*) 91.4 - 52.0 (%)    MCV 89.5  78.0 - 100.0 (fL)    MCH 30.7  26.0 - 34.0 (pg)    MCHC 34.3  30.0 - 36.0 (g/dL)    RDW 78.2 (*) 95.6 - 15.5 (%)    Platelets 391  150 -  400 (K/uL)   COMPREHENSIVE METABOLIC PANEL     Status: Abnormal   Collection Time   02/15/11  6:00 AM      Component Value Range Comment   Sodium 138  135 - 145 (mEq/L)    Potassium 3.8  3.5 - 5.1 (mEq/L)    Chloride 107  96 - 112 (mEq/L)    CO2 22  19 - 32 (mEq/L)    Glucose, Bld 117 (*) 70 - 99 (mg/dL)    BUN 6  6 - 23 (mg/dL)    Creatinine, Ser 2.13  0.50 - 1.35 (mg/dL)    Calcium 8.6  8.4 - 10.5 (mg/dL)    Total Protein 7.0  6.0 - 8.3 (g/dL)    Albumin 3.7  3.5 - 5.2 (g/dL)    AST 71 (*) 0 - 37 (U/L)    ALT 67 (*) 0 - 53 (U/L)    Alkaline Phosphatase 113  39 - 117 (U/L)    Total Bilirubin 2.9 (*) 0.3 - 1.2 (mg/dL)    GFR calc non Af Amer >90  >90 (mL/min)    GFR calc Af Amer >90  >90 (mL/min)   MAGNESIUM     Status: Normal   Collection Time   02/15/11  6:00 AM      Component Value Range Comment   Magnesium 1.7  1.5 - 2.5 (mg/dL)   PRO B NATRIURETIC PEPTIDE     Status: Normal   Collection Time   02/15/11  6:00 AM      Component Value Range Comment   Pro B Natriuretic peptide (BNP) 14.1  0 - 125 (pg/mL)   TYPE AND SCREEN     Status: Normal   Collection Time   02/15/11  4:30 PM      Component Value Range Comment   ABO/RH(D) O POS      Antibody Screen NEG      Sample Expiration 02/18/2011      Unit Number [id number]      Blood Component Type RBC LR PHER1      Unit division 00      Status of Unit ISSUED,FINAL      Transfusion Status OK TO TRANSFUSE      Crossmatch Result COMPATIBLE      Donor AG Type        Value: NEGATIVE FOR C ANTIGEN NEGATIVE FOR E ANTIGEN NEGATIVE FOR KELL ANTIGEN  PREPARE RBC (CROSSMATCH)  Status: Normal   Collection Time   02/15/11  4:30 PM      Component Value Range Comment   Order Confirmation ORDER PROCESSED BY BLOOD BANK     CBC     Status: Abnormal   Collection Time   02/16/11  4:20 AM      Component Value Range Comment   WBC 10.7 (*) 4.0 - 10.5 (K/uL)    RBC 2.16 (*) 4.22 - 5.81 (MIL/uL)    Hemoglobin 6.6 (*) 13.0 - 17.0 (g/dL)    HCT  04.5 (*) 40.9 - 52.0 (%)    MCV 88.9  78.0 - 100.0 (fL)    MCH 30.6  26.0 - 34.0 (pg)    MCHC 34.4  30.0 - 36.0 (g/dL)    RDW 81.1 (*) 91.4 - 15.5 (%)    Platelets 308  150 - 400 (K/uL)   COMPREHENSIVE METABOLIC PANEL     Status: Abnormal   Collection Time   02/16/11  4:20 AM      Component Value Range Comment   Sodium 136  135 - 145 (mEq/L)    Potassium 3.6  3.5 - 5.1 (mEq/L)    Chloride 105  96 - 112 (mEq/L)    CO2 23  19 - 32 (mEq/L)    Glucose, Bld 111 (*) 70 - 99 (mg/dL)    BUN 6  6 - 23 (mg/dL)    Creatinine, Ser 7.82  0.50 - 1.35 (mg/dL)    Calcium 8.7  8.4 - 10.5 (mg/dL)    Total Protein 7.0  6.0 - 8.3 (g/dL)    Albumin 3.7  3.5 - 5.2 (g/dL)    AST 67 (*) 0 - 37 (U/L)    ALT 74 (*) 0 - 53 (U/L)    Alkaline Phosphatase 117  39 - 117 (U/L)    Total Bilirubin 2.8 (*) 0.3 - 1.2 (mg/dL)    GFR calc non Af Amer >90  >90 (mL/min)    GFR calc Af Amer >90  >90 (mL/min)   FERRITIN     Status: Abnormal   Collection Time   02/16/11  4:20 AM      Component Value Range Comment   Ferritin 2043 (*) 22 - 322 (ng/mL) Result confirmed by automatic dilution.  HEMOGLOBIN AND HEMATOCRIT, BLOOD     Status: Abnormal   Collection Time   02/16/11  5:15 AM      Component Value Range Comment   Hemoglobin 7.5 (*) 13.0 - 17.0 (g/dL) RESULTS VERIFIED VIA RECOLLECT   HCT 22.7 (*) 39.0 - 52.0 (%)     Studies/Results: No results found.  Patient Active Problem List  Diagnoses  . Sickle cell anemia  . Leukocytosis  . Chronic pain  . Hypokalemia  . Metabolic acidosis  . Thrombocytosis  . Hemochromatosis    Impression: Sickle cell crisis. Progressive anemia secondary to #1. Leukocytosis improved. Hypokalemia resolved. Hemachromatosis. Ferritin level at 2043.   Plan: Continue supportive therapy. Recheck his hemoglobin a.m. Transfuse as further decline. Anticipate discharge home 24-48 hours.   [name], Kairy Folsom 02/16/2011 9:33 PM

## 2011-02-16 NOTE — Progress Notes (Signed)
Patient discussed at the Long Length of Stay [name] 02/16/2011  

## 2011-02-17 DIAGNOSIS — D5700 Hb-SS disease with crisis, unspecified: Secondary | ICD-10-CM | POA: Diagnosis not present

## 2011-02-17 DIAGNOSIS — D72829 Elevated white blood cell count, unspecified: Secondary | ICD-10-CM | POA: Diagnosis not present

## 2011-02-17 DIAGNOSIS — E872 Acidosis: Secondary | ICD-10-CM | POA: Diagnosis not present

## 2011-02-17 DIAGNOSIS — E876 Hypokalemia: Secondary | ICD-10-CM | POA: Diagnosis not present

## 2011-02-17 DIAGNOSIS — D473 Essential (hemorrhagic) thrombocythemia: Secondary | ICD-10-CM | POA: Diagnosis not present

## 2011-02-17 DIAGNOSIS — D5701 Hb-SS disease with acute chest syndrome: Secondary | ICD-10-CM | POA: Diagnosis not present

## 2011-02-17 LAB — CBC
MCH: 29.6 pg (ref 26.0–34.0)
MCHC: 33.3 g/dL (ref 30.0–36.0)
RDW: 17.3 % — ABNORMAL HIGH (ref 11.5–15.5)

## 2011-02-17 LAB — COMPREHENSIVE METABOLIC PANEL
ALT: 83 U/L — ABNORMAL HIGH (ref 0–53)
Albumin: 4 g/dL (ref 3.5–5.2)
Alkaline Phosphatase: 117 U/L (ref 39–117)
Calcium: 8.9 mg/dL (ref 8.4–10.5)
Potassium: 3.9 mEq/L (ref 3.5–5.1)
Sodium: 137 mEq/L (ref 135–145)
Total Protein: 7.5 g/dL (ref 6.0–8.3)

## 2011-02-17 MED ORDER — LIDOCAINE 5 % EX PTCH
1.0000 | MEDICATED_PATCH | Freq: Every day | CUTANEOUS | Status: DC
  Administered 2011-02-17 – 2011-02-21 (×5): 1 via TRANSDERMAL
  Filled 2011-02-17 (×5): qty 1

## 2011-02-17 MED ORDER — DEXTROSE 5 % IV SOLN
2000.0000 mg | Freq: Every day | INTRAVENOUS | Status: AC
  Administered 2011-02-17 – 2011-02-19 (×2): 2000 mg via INTRAVENOUS
  Filled 2011-02-17 (×2): qty 2

## 2011-02-17 NOTE — Progress Notes (Signed)
Subjective: The patient was seen on rounds today.  The patient was sitting quietly in his bed watching television.  The patient rates his pain as 7/10 today, primarily in his back area.  The patient stated that his temporal headache has resolved. The patient states that he may be ready for discharge tomorrow.  No other nursing or patient concerns.   Objective: Vital signs in last 24 hours: Blood pressure 111/62, pulse 82, temperature 98.4 F (36.9 C), temperature source Oral, resp. rate 16, height 6' (1.829 m), weight 182 lb 6.4 oz (82.736 kg), SpO2 97.00%.  General Appearance: Alert, cooperative, mild distress Head: Normocephalic, without obvious abnormality, atraumatic Eyes: PERRLA, EOMI, the patient had slight strabismus, scleral icterus Nose: Nares normal, septum midline, mucosa normal, no drainage or sinus tenderness. Throat: Lips, mucosa, and tongue normal, teeth and gums normal Back: Symmetric, bilateral CVA tenderness, diffuse tenderness Resp: CTA, Diminished bibasilar breath sounds (RLL > LLL), no wheezes/rales/rhonchi Cardio: S1, S2 regular, no murmurs/clicks/rubs/gallops GI: Soft, non-tender, hypoactive bowel sounds, no masses, no organomegaly Male Genitalia: Deferred, the patient denies priapism Extremities: Extremities normal, atraumatic, no cyanosis or edema, negative Homans sign, no sign of DVT Pulses: 2+ and symmetric Skin: Skin color, texture, turgor normal, no rashes or lesions Neurologic: Grossly normal, CN II-XII intact, no focal deficits Psych:  Appropriate affect  Lab Results: Results for orders placed during the hospital encounter of 02/07/11 (from the past 24 hour(s))  CBC     Status: Abnormal   Collection Time   02/17/11  4:30 AM      Component Value Range   WBC 14.3 (*) 4.0 - 10.5 (K/uL)   RBC 2.60 (*) 4.22 - 5.81 (MIL/uL)   Hemoglobin 7.7 (*) 13.0 - 17.0 (g/dL)   HCT 16.1 (*) 09.6 - 52.0 (%)   MCV 88.8  78.0 - 100.0 (fL)   MCH 29.6  26.0 - 34.0 (pg)   MCHC 33.3  30.0 - 36.0 (g/dL)   RDW 04.5 (*) 40.9 - 15.5 (%)   Platelets 324  150 - 400 (K/uL)  COMPREHENSIVE METABOLIC PANEL     Status: Abnormal   Collection Time   02/17/11  4:30 AM      Component Value Range   Sodium 137  135 - 145 (mEq/L)   Potassium 3.9  3.5 - 5.1 (mEq/L)   Chloride 103  96 - 112 (mEq/L)   CO2 24  19 - 32 (mEq/L)   Glucose, Bld 101 (*) 70 - 99 (mg/dL)   BUN 5 (*) 6 - 23 (mg/dL)   Creatinine, Ser 8.11  0.50 - 1.35 (mg/dL)   Calcium 8.9  8.4 - 91.4 (mg/dL)   Total Protein 7.5  6.0 - 8.3 (g/dL)   Albumin 4.0  3.5 - 5.2 (g/dL)   AST 70 (*) 0 - 37 (U/L)   ALT 83 (*) 0 - 53 (U/L)   Alkaline Phosphatase 117  39 - 117 (U/L)   Total Bilirubin 2.8 (*) 0.3 - 1.2 (mg/dL)   GFR calc non Af Amer >90  >90 (mL/min)   GFR calc Af Amer >90  >90 (mL/min)    Studies/Results: No results found.  Medications: I have reviewed the patient's current medications.  Allergies  Allergen Reactions  . Morphine And Related Rash    Current Facility-Administered Medications  Medication Dose Route Frequency Provider Last Rate Last Dose  . deferoxamine (DESFERAL) 2,000 mg in dextrose 5 % 500 mL infusion  2,000 mg Intravenous QHS Larina Bras, NP      .  dextrose 5 %-0.45 % sodium chloride infusion   Intravenous Continuous Larina Bras, NP 75 mL/hr at 02/17/11 0030    . diphenhydrAMINE (BENADRYL) injection 25 mg  25 mg Intravenous Q4H PRN Larina Bras, NP   25 mg at 02/17/11 1147   Or  . diphenhydrAMINE (BENADRYL) 12.5 MG/5ML elixir 12.5 mg  12.5 mg Oral Q4H PRN Larina Bras, NP      . enoxaparin (LOVENOX) injection 40 mg  40 mg Subcutaneous Q24H [name], MD   40 mg at 02/16/11 2146  . folic acid (FOLVITE) tablet 1 mg  1 mg Oral Daily [name], MD   1 mg at 02/17/11 0937  . HYDROcodone-acetaminophen (NORCO) 5-325 MG per tablet 1-2 tablet  1-2 tablet Oral Q4H PRN [name], MD   2 tablet at 02/08/11 0328  . HYDROmorphone (DILAUDID) injection 2-4 mg  2-4  mg Intravenous Q2H Larina Bras, NP   4 mg at 02/17/11 1147  . hydroxyurea (HYDREA) capsule 500 mg  500 mg Oral BID [name], MD   500 mg at 02/17/11 0930  . ibuprofen (ADVIL,MOTRIN) tablet 800 mg  800 mg Oral Q8H PRN Larina Bras, NP   800 mg at 02/13/11 1031  . ketorolac (TORADOL) 30 MG/ML injection 30 mg  30 mg Intravenous Q6H Larina Bras, NP   30 mg at 02/16/11 1258  . lidocaine (LIDODERM) 5 % 1 patch  1 patch Transdermal Q24H Larina Bras, NP      . naloxone [facility]) injection 0.4 mg  0.4 mg Intravenous PRN Willey Blade, MD       And  . sodium chloride 0.9 % injection 9 mL  9 mL Intravenous PRN Willey Blade, MD      . OLANZapine (ZYPREXA) tablet 10 mg  10 mg Oral QHS [name], MD   10 mg at 02/16/11 2145  . ondansetron (ZOFRAN) injection 4 mg  4 mg Intravenous Q4H PRN Larina Bras, NP   4 mg at 02/17/11 0943  . oxyCODONE (OXYCONTIN) 12 hr tablet 40 mg  40 mg Oral BID [name], MD   40 mg at 02/17/11 0930  . pantoprazole (PROTONIX) EC tablet 40 mg  40 mg Oral Q1200 Larina Bras, NP   40 mg at 02/17/11 1150  . sodium chloride 0.9 % injection 10 mL  10 mL Intracatheter PRN Willey Blade, MD      . zolpidem [facility]) tablet 10 mg  10 mg Oral QHS PRN [name], MD   10 mg at 02/16/11 2309    Assessment/Plan: Patient Active Problem List  Diagnoses  . Sickle cell anemia  . Leukocytosis  . Chronic pain  . Hypokalemia  . Metabolic acidosis  . Thrombocytosis  . Hemochromatosis    Sickle Cell Crisis/Chronic Pain/Metabolic Acidosis:  The patient will continue pain management and will have a Lidoderm patch added to the pain regimen,  IV hydration, home medications, GI and DVT prophylaxis to continue.   CMP, Mg and CBC in the am.   Leukocytosis/Thrombocytocis:  Leukocytosis persists and appears to correlate with the patient's pain level.  Thrombocytosis is WNL - will continue to monitor. Hypokalemia:  Potassium level is WNL - will continue to  monitor Hemochromatosis:  The patient will receive a Desferal infusions for the next two nights - will monitor Ferritin level   Discussed and agreed upon plan of care with the patient.   The plan of care will be adjusted based on the patient's clinical progress.  Patient may be ready for  discharge within the next 24 - 48 hours.   Larina Bras, NP-C 02/17/2011, 12:34 PM

## 2011-02-18 DIAGNOSIS — D473 Essential (hemorrhagic) thrombocythemia: Secondary | ICD-10-CM | POA: Diagnosis not present

## 2011-02-18 DIAGNOSIS — D5700 Hb-SS disease with crisis, unspecified: Secondary | ICD-10-CM | POA: Diagnosis not present

## 2011-02-18 DIAGNOSIS — E876 Hypokalemia: Secondary | ICD-10-CM | POA: Diagnosis not present

## 2011-02-18 DIAGNOSIS — D5701 Hb-SS disease with acute chest syndrome: Secondary | ICD-10-CM

## 2011-02-18 DIAGNOSIS — D72829 Elevated white blood cell count, unspecified: Secondary | ICD-10-CM | POA: Diagnosis not present

## 2011-02-18 DIAGNOSIS — E872 Acidosis: Secondary | ICD-10-CM | POA: Diagnosis not present

## 2011-02-18 LAB — COMPREHENSIVE METABOLIC PANEL
ALT: 90 U/L — ABNORMAL HIGH (ref 0–53)
AST: 71 U/L — ABNORMAL HIGH (ref 0–37)
CO2: 24 mEq/L (ref 19–32)
Calcium: 8.9 mg/dL (ref 8.4–10.5)
Chloride: 102 mEq/L (ref 96–112)
Creatinine, Ser: 0.59 mg/dL (ref 0.50–1.35)
GFR calc Af Amer: >90 mL/min (ref >90)
GFR calc non Af Amer: >90 mL/min (ref >90)
Glucose, Bld: 96 mg/dL (ref 70–99)
Sodium: 135 mEq/L (ref 135–145)
Total Bilirubin: 2.8 mg/dL — ABNORMAL HIGH (ref 0.3–1.2)

## 2011-02-18 LAB — CBC
Hemoglobin: 7.6 g/dL — ABNORMAL LOW (ref 13.0–17.0)
MCH: 29.3 pg (ref 26.0–34.0)
MCHC: 32.6 g/dL (ref 30.0–36.0)
MCV: 90 fL (ref 78.0–100.0)

## 2011-02-18 LAB — PREPARE RBC (CROSSMATCH)

## 2011-02-18 MED ORDER — MAGNESIUM SULFATE 50 % IJ SOLN
Freq: Once | INTRAVENOUS | Status: AC
  Administered 2011-02-18: 12:00:00 via INTRAVENOUS
  Filled 2011-02-18 (×2): qty 50

## 2011-02-18 MED ORDER — LEVALBUTEROL HCL 0.63 MG/3ML IN NEBU
0.6300 mg | INHALATION_SOLUTION | Freq: Three times a day (TID) | RESPIRATORY_TRACT | Status: DC | PRN
  Filled 2011-02-18: qty 3

## 2011-02-18 MED ORDER — LEVOFLOXACIN IN D5W 750 MG/150ML IV SOLN
750.0000 mg | INTRAVENOUS | Status: DC
  Administered 2011-02-18 – 2011-02-20 (×3): 750 mg via INTRAVENOUS
  Filled 2011-02-18 (×4): qty 150

## 2011-02-18 MED ORDER — HYDROMORPHONE HCL PF 2 MG/ML IJ SOLN
4.0000 mg | INTRAMUSCULAR | Status: DC | PRN
  Administered 2011-02-18: 4 mg via INTRAMUSCULAR
  Filled 2011-02-18: qty 2

## 2011-02-18 MED ORDER — ALTEPLASE 100 MG IV SOLR
2.0000 mg | Freq: Once | INTRAVENOUS | Status: AC
  Administered 2011-02-18: 2 mg
  Filled 2011-02-18: qty 2

## 2011-02-18 MED ORDER — MAGNESIUM SULFATE 40 MG/ML IJ SOLN: 2.0000 g | Freq: Once | INTRAMUSCULAR | Status: DC

## 2011-02-18 MED ORDER — GUAIFENESIN ER 600 MG PO TB12
600.0000 mg | ORAL_TABLET | Freq: Two times a day (BID) | ORAL | Status: DC
  Administered 2011-02-18 – 2011-02-21 (×7): 600 mg via ORAL
  Filled 2011-02-18 (×9): qty 1

## 2011-02-18 MED ORDER — LEVALBUTEROL HCL 0.63 MG/3ML IN NEBU
0.6300 mg | INHALATION_SOLUTION | Freq: Three times a day (TID) | RESPIRATORY_TRACT | Status: DC
  Filled 2011-02-18 (×3): qty 3

## 2011-02-18 MED ORDER — ALTEPLASE 2 MG IJ SOLR
2.0000 mg | Freq: Once | INTRAMUSCULAR | Status: AC | PRN
  Administered 2011-02-18: 2 mg
  Filled 2011-02-18: qty 2

## 2011-02-18 NOTE — Progress Notes (Signed)
Attempted to do blood exchange at 1530, no blood return from red port and sluggish when flushed. Was able to withdraw 160cc  of blood from purple port with flushing with 10cc ns between draw offs. Then unable to flush or draw off blood from purple port. PA notified and wants TPA to each port then draw off the remaing 40cc of blood. TPA instilled to red port at 1605 and TPA to purple port at 1620. Will return in 1-2 hrs to withdraw.

## 2011-02-18 NOTE — Progress Notes (Signed)
Subjective: The patient was seen on rounds today.  The patient was sitting quietly in his bed watching television.  The patient rates his pain as 7/10 today, primarily in his back area.  The patient is also complaining of having a non-productive cough and chest congestion.  Reinforced IS usage with the patient.  The patient says the recent snowstorm has caused a flare-up in his pain and he is not ready to go home just yet.  No other nursing or patient concerns.   Objective: Vital signs in last 24 hours: Blood pressure 145/88, pulse 90, temperature 97.4 F (36.3 C), temperature source Oral, resp. rate 18, height 6' (1.829 m), weight 182 lb 6.4 oz (82.736 kg), SpO2 98.00%.  General Appearance: Alert, cooperative, moderate distress Head: Normocephalic, without obvious abnormality, atraumatic Eyes: PERRLA, EOMI, the patient had slight strabismus, scleral icterus Nose: Nares normal, septum midline, mucosa normal, no drainage or sinus tenderness. Throat:  Normal lips, mucosa, tongue and gums, dental caries present Back: Symmetric, bilateral CVA tenderness, diffuse tenderness Resp: CTA, Diminished bibasilar breath sounds (RLL > LLL), no wheezes/rales/rhonchi Cardio: S1, S2 regular, no murmurs/clicks/rubs/gallops GI: Soft, non-tender, normoactive bowel sounds, no masses, no organomegaly Male Genitalia: Deferred, the patient denies priapism Extremities: Extremities normal, atraumatic, no cyanosis or edema, negative Homans sign, no sign of DVT Pulses: 2+ and symmetric Skin: Skin color, texture, turgor normal, no rashes or lesions Neurologic: Grossly normal, CN II-XII intact, no focal deficits Psych:  Appropriate affect  Lab Results: Results for orders placed during the hospital encounter of 02/07/11 (from the past 24 hour(s))  COMPREHENSIVE METABOLIC PANEL     Status: Abnormal   Collection Time   02/18/11  5:00 AM      Component Value Range   Sodium 135  135 - 145 (mEq/L)   Potassium 3.6  3.5 -  5.1 (mEq/L)   Chloride 102  96 - 112 (mEq/L)   CO2 24  19 - 32 (mEq/L)   Glucose, Bld 96  70 - 99 (mg/dL)   BUN 6  6 - 23 (mg/dL)   Creatinine, Ser 6.64  0.50 - 1.35 (mg/dL)   Calcium 8.9  8.4 - 40.3 (mg/dL)   Total Protein 7.4  6.0 - 8.3 (g/dL)   Albumin 3.9  3.5 - 5.2 (g/dL)   AST 71 (*) 0 - 37 (U/L)   ALT 90 (*) 0 - 53 (U/L)   Alkaline Phosphatase 128 (*) 39 - 117 (U/L)   Total Bilirubin 2.8 (*) 0.3 - 1.2 (mg/dL)   GFR calc non Af Amer >90  >90 (mL/min)   GFR calc Af Amer >90  >90 (mL/min)  MAGNESIUM     Status: Normal   Collection Time   02/18/11  5:00 AM      Component Value Range   Magnesium 1.6  1.5 - 2.5 (mg/dL)  CBC     Status: Abnormal   Collection Time   02/18/11  7:00 AM      Component Value Range   WBC 13.5 (*) 4.0 - 10.5 (K/uL)   RBC 2.59 (*) 4.22 - 5.81 (MIL/uL)   Hemoglobin 7.6 (*) 13.0 - 17.0 (g/dL)   HCT 47.4 (*) 25.9 - 52.0 (%)   MCV 90.0  78.0 - 100.0 (fL)   MCH 29.3  26.0 - 34.0 (pg)   MCHC 32.6  30.0 - 36.0 (g/dL)   RDW 56.3 (*) 87.5 - 15.5 (%)   Platelets 475 (*) 150 - 400 (K/uL)    Studies/Results: No results found.  Medications: I have reviewed the patient's current medications.  Allergies  Allergen Reactions  . Morphine And Related Rash    Current Facility-Administered Medications  Medication Dose Route Frequency Provider Last Rate Last Dose  . alteplase (CATHFLO ACTIVASE) injection 2 mg  2 mg Intracatheter Once PRN Larina Bras, NP      . deferoxamine (DESFERAL) 2,000 mg in dextrose 5 % 500 mL infusion  2,000 mg Intravenous QHS Larina Bras, NP 125 mL/hr at 02/17/11 2154 2,000 mg at 02/17/11 2154  . dextrose 5 % 50 mL with magnesium sulfate 2 g infusion   Intravenous Once Willey Blade, MD 50 mL/hr at 02/18/11 1208    . dextrose 5 %-0.45 % sodium chloride infusion   Intravenous Continuous Larina Bras, NP 75 mL/hr at 02/17/11 1317    . diphenhydrAMINE (BENADRYL) injection 25 mg  25 mg Intravenous Q4H PRN Larina Bras, NP   25  mg at 02/18/11 0656   Or  . diphenhydrAMINE (BENADRYL) 12.5 MG/5ML elixir 12.5 mg  12.5 mg Oral Q4H PRN Larina Bras, NP      . enoxaparin (LOVENOX) injection 40 mg  40 mg Subcutaneous Q24H [name], MD   40 mg at 02/17/11 2155  . folic acid (FOLVITE) tablet 1 mg  1 mg Oral Daily [name], MD   1 mg at 02/18/11 0908  . guaiFENesin (MUCINEX) 12 hr tablet 600 mg  600 mg Oral BID Larina Bras, NP   600 mg at 02/18/11 1125  . HYDROcodone-acetaminophen (NORCO) 5-325 MG per tablet 1-2 tablet  1-2 tablet Oral Q4H PRN [name], MD   2 tablet at 02/08/11 0328  . HYDROmorphone (DILAUDID) injection 2-4 mg  2-4 mg Intravenous Q2H Larina Bras, NP   4 mg at 02/18/11 1312  . hydroxyurea (HYDREA) capsule 500 mg  500 mg Oral BID [name], MD   500 mg at 02/18/11 0909  . ibuprofen (ADVIL,MOTRIN) tablet 800 mg  800 mg Oral Q8H PRN Larina Bras, NP   800 mg at 02/13/11 1031  . levalbuterol (XOPENEX) nebulizer solution 0.63 mg  0.63 mg Nebulization Q8H Larina Bras, NP      . Levofloxacin (LEVAQUIN) IVPB 750 mg  750 mg Intravenous Q24H Larina Bras, NP   750 mg at 02/18/11 1126  . lidocaine (LIDODERM) 5 % 1 patch  1 patch Transdermal Daily Larina Bras, NP   1 patch at 02/18/11 0908  . naloxone [facility]) injection 0.4 mg  0.4 mg Intravenous PRN Willey Blade, MD       And  . sodium chloride 0.9 % injection 9 mL  9 mL Intravenous PRN Willey Blade, MD      . OLANZapine (ZYPREXA) tablet 10 mg  10 mg Oral QHS [name], MD   10 mg at 02/17/11 2154  . ondansetron (ZOFRAN) injection 4 mg  4 mg Intravenous Q4H PRN Larina Bras, NP   4 mg at 02/17/11 0943  . oxyCODONE (OXYCONTIN) 12 hr tablet 40 mg  40 mg Oral BID [name], MD   40 mg at 02/18/11 0908  . pantoprazole (PROTONIX) EC tablet 40 mg  40 mg Oral Q1200 Larina Bras, NP   40 mg at 02/18/11 1114  . sodium chloride 0.9 % injection 10 mL  10 mL Intracatheter PRN Willey Blade, MD      . zolpidem  [facility]) tablet 10 mg  10 mg Oral QHS PRN [name], MD   10 mg at 02/17/11 2154  . DISCONTD: magnesium sulfate IVPB 2  g 50 mL  2 g Intravenous Once Larina Bras, NP        Assessment/Plan: Patient Active Problem List  Diagnoses  . Sickle cell anemia  . Leukocytosis  . Chronic pain  . Hypokalemia  . Metabolic acidosis  . Thrombocytosis  . Hemochromatosis  . Hypomagnesemia  . Acute chest syndrome due to sickle cell crisis    Sickle Cell Crisis/Chronic Pain/Metabolic Acidosis:  The patient will have a blood exchange today.   The patient will continue pain management,  IV hydration, home medications, GI and DVT prophylaxis to continue.  CMP and CBC in the am.   Leukocytosis/Thrombocytocis/Acute Chest Syndrome:  The patient will be started on IV antibiotics today as acute chest syndrome as related to sickle cell crisis appears to be developing.  The patient will also be given Guaifensin and scheduled nebulizer treatments for the next 48 hours. Hypokalemia:  Potassium level is WNL - will continue to monitor Hemochromatosis:  The patient will receive a Desferal infusion tonight after the blood exchange.  Ferritin level will be checked in the am. Hypomagnesemia:   The patient will receive a magnesium sulfate infusion today.   Will check magnesium level in the am.  Larina Bras, NP-C 02/18/2011, 11:07 AM

## 2011-02-18 NOTE — Progress Notes (Signed)
At 1820 withdrew 5ml  blood from red port then flushed with 10cc ns without difficulty. Withdrew remaining 40ml of blood then flushed with 10cc ns. Unable to withdraw from purple port will leave TPA in purple port and come to ck later. Primary nurse notified and port taped off.

## 2011-02-19 DIAGNOSIS — D473 Essential (hemorrhagic) thrombocythemia: Secondary | ICD-10-CM | POA: Diagnosis not present

## 2011-02-19 DIAGNOSIS — E876 Hypokalemia: Secondary | ICD-10-CM | POA: Diagnosis not present

## 2011-02-19 DIAGNOSIS — E872 Acidosis: Secondary | ICD-10-CM | POA: Diagnosis not present

## 2011-02-19 DIAGNOSIS — R079 Chest pain, unspecified: Secondary | ICD-10-CM | POA: Diagnosis not present

## 2011-02-19 DIAGNOSIS — D5701 Hb-SS disease with acute chest syndrome: Secondary | ICD-10-CM | POA: Diagnosis not present

## 2011-02-19 DIAGNOSIS — D5700 Hb-SS disease with crisis, unspecified: Secondary | ICD-10-CM | POA: Diagnosis not present

## 2011-02-19 LAB — MAGNESIUM: Magnesium: 1.7 mg/dL (ref 1.5–2.5)

## 2011-02-19 LAB — FERRITIN: Ferritin: 2579 ng/mL — ABNORMAL HIGH (ref 22–322)

## 2011-02-19 LAB — CBC
HCT: 24.6 % — ABNORMAL LOW (ref 39.0–52.0)
MCH: 30.3 pg (ref 26.0–34.0)
MCHC: 33.3 g/dL (ref 30.0–36.0)
RDW: 17 % — ABNORMAL HIGH (ref 11.5–15.5)

## 2011-02-19 LAB — COMPREHENSIVE METABOLIC PANEL
Albumin: 3.9 g/dL (ref 3.5–5.2)
Alkaline Phosphatase: 133 U/L — ABNORMAL HIGH (ref 39–117)
BUN: 6 mg/dL (ref 6–23)
Calcium: 9.2 mg/dL (ref 8.4–10.5)
Creatinine, Ser: 0.61 mg/dL (ref 0.50–1.35)
GFR calc Af Amer: >90 mL/min (ref >90)
Potassium: 3.6 mEq/L (ref 3.5–5.1)
Total Protein: 7.6 g/dL (ref 6.0–8.3)

## 2011-02-19 LAB — PRO B NATRIURETIC PEPTIDE: Pro B Natriuretic peptide (BNP): 8.2 pg/mL (ref 0–125)

## 2011-02-19 NOTE — Progress Notes (Signed)
Subjective:  Patient complains of back and rib and shoulder pain grade 8/10 Denies any chest pain or shortness of breath Denies any fever or chills  Objective:  Vital Signs in the last 24 hours: Temp:  [98.2 F (36.8 C)-99.3 F (37.4 C)] 98.9 F (37.2 C) (01/19 0532) Pulse Rate:  [82-96] 96  (01/19 0532) Resp:  [16-18] 16  (01/19 0532) BP: (112-135)/(70-86) 126/78 mmHg (01/19 0532) SpO2:  [97 %-98 %] 98 % (01/19 0532) Weight:  [81.647 kg (180 lb)] 81.647 kg (180 lb) (01/19 0532)  Intake/Output from previous day: 01/18 0701 - 01/19 0700 In: 2714.4 [P.O.:240; I.V.:1581.9; Blood:292.5; IV Piggyback:500] Out: 2050 [Urine:2050] Intake/Output from this shift:    Physical Exam: General appearance: alert and cooperative Neck: no adenopathy, no carotid bruit, no JVD and supple, symmetrical, trachea midline Lungs: clear to auscultation bilaterally Heart: regular rate and rhythm, S1, S2 normal, no murmur, click, rub or gallop Abdomen: soft, non-tender; bowel sounds normal; no masses,  no organomegaly Extremities: extremities normal, atraumatic, no cyanosis or edema Pulses: 2+ and symmetric  Lab Results:  Basename 02/19/11 0530 02/18/11 0700  WBC 16.5* 13.5*  HGB 8.2* 7.6*  PLT 460* 475*    Basename 02/19/11 0530 02/18/11 0500  NA 134* 135  K 3.6 3.6  CL 101 102  CO2 25 24  GLUCOSE 107* 96  BUN 6 6  CREATININE 0.61 0.59   No results found for this basename: TROPONINI:2,CK,MB:2 in the last 72 hours Hepatic Function Panel  Basename 02/19/11 0530  PROT 7.6  ALBUMIN 3.9  AST 69*  ALT 95*  ALKPHOS 133*  BILITOT 2.9*  BILIDIR --  IBILI --   No results found for this basename: CHOL in the last 72 hours No results found for this basename: PROTIME in the last 72 hours  Imaging: No results found.  Cardiac Studies:  Assessment/Plan:  Sickle cell crisis Chronic pain syndrome Anemia Leukocytosis Hemochromatosis Plan Continue present management  LOS: 12 days     Saryn Cherry N 02/19/2011, 2:36 PM

## 2011-02-20 DIAGNOSIS — D5700 Hb-SS disease with crisis, unspecified: Secondary | ICD-10-CM | POA: Diagnosis not present

## 2011-02-20 DIAGNOSIS — R079 Chest pain, unspecified: Secondary | ICD-10-CM | POA: Diagnosis not present

## 2011-02-20 DIAGNOSIS — D5701 Hb-SS disease with acute chest syndrome: Secondary | ICD-10-CM | POA: Diagnosis not present

## 2011-02-20 DIAGNOSIS — D473 Essential (hemorrhagic) thrombocythemia: Secondary | ICD-10-CM | POA: Diagnosis not present

## 2011-02-20 DIAGNOSIS — E872 Acidosis: Secondary | ICD-10-CM | POA: Diagnosis not present

## 2011-02-20 DIAGNOSIS — E876 Hypokalemia: Secondary | ICD-10-CM | POA: Diagnosis not present

## 2011-02-20 LAB — COMPREHENSIVE METABOLIC PANEL
Alkaline Phosphatase: 136 U/L — ABNORMAL HIGH (ref 39–117)
BUN: 6 mg/dL (ref 6–23)
Calcium: 9.1 mg/dL (ref 8.4–10.5)
Creatinine, Ser: 0.59 mg/dL (ref 0.50–1.35)
GFR calc Af Amer: >90 mL/min (ref >90)
Glucose, Bld: 109 mg/dL — ABNORMAL HIGH (ref 70–99)
Total Protein: 7.8 g/dL (ref 6.0–8.3)

## 2011-02-20 LAB — CBC
HCT: 25.4 % — ABNORMAL LOW (ref 39.0–52.0)
Hemoglobin: 8.3 g/dL — ABNORMAL LOW (ref 13.0–17.0)
MCH: 29.6 pg (ref 26.0–34.0)
MCHC: 32.7 g/dL (ref 30.0–36.0)
MCV: 90.7 fL (ref 78.0–100.0)
WBC: 15.8 10*3/uL — ABNORMAL HIGH (ref 4.0–10.5)

## 2011-02-20 NOTE — Progress Notes (Signed)
Subjective:  Patient complains of back rib and shoulder pain grade 5/10 the pain slowly improving Denies any fever or chills  Objective:  Vital Signs in the last 24 hours: Temp:  [97.2 F (36.2 C)-99.2 F (37.3 C)] 98 F (36.7 C) (01/20 1300) Pulse Rate:  [88-96] 88  (01/20 1300) Resp:  [18-20] 20  (01/20 1300) BP: (119-129)/(73-79) 129/75 mmHg (01/20 1300) SpO2:  [97 %-100 %] 100 % (01/20 1300) Weight:  [82.192 kg (181 lb 3.2 oz)] 82.192 kg (181 lb 3.2 oz) (01/20 0507)  Intake/Output from previous day: 01/19 0701 - 01/20 0700 In: 1770.2 [P.O.:720; I.V.:1050.2] Out: 2251 [Urine:2250; Stool:1] Intake/Output from this shift: Total I/O In: 924 [I.V.:924] Out: 300 [Urine:300]  Physical Exam: Neck: no adenopathy, no carotid bruit, no JVD and supple, symmetrical, trachea midline Lungs: clear to auscultation bilaterally Heart: regular rate and rhythm, S1, S2 normal, no murmur, click, rub or gallop Abdomen: soft, non-tender; bowel sounds normal; no masses,  no organomegaly Extremities: extremities normal, atraumatic, no cyanosis or edema Pulses: 2+ and symmetric  Lab Results:  Basename 02/20/11 0508 02/19/11 0530  WBC 15.8* 16.5*  HGB 8.3* 8.2*  PLT 502* 460*    Basename 02/20/11 0508 02/19/11 0530  NA 137 134*  K 3.7 3.6  CL 102 101  CO2 24 25  GLUCOSE 109* 107*  BUN 6 6  CREATININE 0.59 0.61   No results found for this basename: TROPONINI:2,CK,MB:2 in the last 72 hours Hepatic Function Panel  Basename 02/20/11 0508  PROT 7.8  ALBUMIN 4.0  AST 70*  ALT 94*  ALKPHOS 136*  BILITOT 3.0*  BILIDIR --  IBILI --   No results found for this basename: CHOL in the last 72 hours No results found for this basename: PROTIME in the last 72 hours  Imaging: No results found.  Cardiac Studies:  Assessment/Plan:  Resolving sickle cell crisis  Chronic pain syndrome Hemachromatosis Leukocytosis Plan Continue present management  LOS: 13 days    [name]  N 02/20/2011, 5:37 PM

## 2011-02-21 DIAGNOSIS — E876 Hypokalemia: Secondary | ICD-10-CM | POA: Diagnosis not present

## 2011-02-21 DIAGNOSIS — E872 Acidosis: Secondary | ICD-10-CM | POA: Diagnosis not present

## 2011-02-21 DIAGNOSIS — D5701 Hb-SS disease with acute chest syndrome: Secondary | ICD-10-CM | POA: Diagnosis not present

## 2011-02-21 DIAGNOSIS — D5700 Hb-SS disease with crisis, unspecified: Secondary | ICD-10-CM | POA: Diagnosis not present

## 2011-02-21 DIAGNOSIS — D473 Essential (hemorrhagic) thrombocythemia: Secondary | ICD-10-CM | POA: Diagnosis not present

## 2011-02-21 DIAGNOSIS — D72829 Elevated white blood cell count, unspecified: Secondary | ICD-10-CM | POA: Diagnosis not present

## 2011-02-21 LAB — COMPREHENSIVE METABOLIC PANEL
AST: 73 U/L — ABNORMAL HIGH (ref 0–37)
Albumin: 3.8 g/dL (ref 3.5–5.2)
Alkaline Phosphatase: 138 U/L — ABNORMAL HIGH (ref 39–117)
BUN: 7 mg/dL (ref 6–23)
Chloride: 101 mEq/L (ref 96–112)
Potassium: 3.7 mEq/L (ref 3.5–5.1)
Total Bilirubin: 2.8 mg/dL — ABNORMAL HIGH (ref 0.3–1.2)

## 2011-02-21 LAB — CBC
HCT: 24.8 % — ABNORMAL LOW (ref 39.0–52.0)
Hemoglobin: 8.3 g/dL — ABNORMAL LOW (ref 13.0–17.0)
RDW: 17.3 % — ABNORMAL HIGH (ref 11.5–15.5)
WBC: 15.7 10*3/uL — ABNORMAL HIGH (ref 4.0–10.5)

## 2011-02-21 MED ORDER — OXYCODONE HCL 40 MG PO TB12: 40.0000 mg | ORAL_TABLET | Freq: Two times a day (BID) | ORAL | Status: DC

## 2011-02-21 MED ORDER — HYDROXYUREA 500 MG PO CAPS: 500.0000 mg | ORAL_CAPSULE | Freq: Two times a day (BID) | ORAL | Status: DC

## 2011-02-21 MED ORDER — LEVOFLOXACIN 750 MG PO TABS: 750.0000 mg | ORAL_TABLET | Freq: Every day | ORAL | Status: DC

## 2011-02-21 MED ORDER — ZOLPIDEM TARTRATE 10 MG PO TABS: 10.0000 mg | ORAL_TABLET | Freq: Every evening | ORAL | Status: DC | PRN

## 2011-02-21 MED ORDER — OLANZAPINE 10 MG PO TABS: 10.0000 mg | ORAL_TABLET | Freq: Every day | ORAL | Status: DC

## 2011-02-21 MED ORDER — HYDROMORPHONE HCL 4 MG PO TABS: 4.0000 mg | ORAL_TABLET | ORAL | Status: DC | PRN

## 2011-02-21 NOTE — Discharge Summary (Signed)
Physician Discharge Summary  Patient ID: [name] MRN: [id number] DOB/AGE: 32/20/1982 32 y.o.  Admit date: 02/07/2011 Discharge date: 02/21/2011  Admission Diagnoses: Sickle cell anemia  Leukocytosis  Hypokalemia  Metabolic acidosis  Thrombocytosis   Discharge Diagnoses:  Sickle cell anemia Leukocytosis Hypokalemia Metabolic acidosis Thrombocytosis Hemochromatosis Hypomagnesemia Acute chest syndrome due to sickle cell crisis   Discharged Condition: Stable  Hospital Course:   Please refer to the detailed History and Physical for information surrounding this patient's admission,  In short, [name] is an 31 year-old African-American male with a medical history of Sickle Cell Disease and frequent hospitalizations for sickle cell pain crises.  The patient presented to the ER complaining of a 2 day history of worsening back and leg pain similar to his usual vaso-occlusive crises. Upon admission, the patient receive a PICC line and pain management with a PCA pump.  The patient's pain was not controlled with a PCA pump and he was subsequently started on scheduled pain medications.  The patient received aggressive pain management, IV hydration, his home medications were continued, blood exchange/blood transfusion, electrolyte repletion and GI/DVT prophylaxis.  The patient developed acute chest syndrome subsequent to non-compliant IS usage and was started on antibiotic therapy in addition to nebulizer treatments.  The patient is in stable condition at this time and is ready to manage his pain at home (rated as 4/10 today).  The patient will be discharged to home at this time and is eagerly awaiting discharge.  Consults:  None  Significant Diagnostic Studies:  Dg Chest 2 View 02/07/2011  *RADIOLOGY REPORT*  Clinical Data: Left posterior lateral and lower chest pain. Sickle cell disease.  CHEST - 2 VIEW  Comparison: 12/06 and 01/04/2011  Findings: Stable cardiomegaly.  Pulmonary  vascularity is normal. Stable slight scarring at the lung bases.  No acute infiltrates or effusions.  No osseous abnormality.  IMPRESSION: No acute disease.  Original Report Authenticated By: Gwynn Burly, M.D.   Chest Portable 1 View Post Insertion To Confirm Placement As Interpreted By Radiologist 02/08/2011  *RADIOLOGY REPORT*  Clinical Data: PICC line placement, sickle cell disease  PORTABLE CHEST - 1 VIEW  Comparison: 02/07/2011  Findings: Right PICC line tip is at the innominate SVC junction. This could be advanced 6 cm to the SVC RA junction.  Heart is enlarged with vascular congestion and basilar atelectasis.  No focal pneumonia, collapse, consolidation, effusion or pneumothorax. Trachea midline.  IMPRESSION: Right PICC line tip at the innominate SVC junction, and could be advanced 6 cm.  Cardiomegaly with vascular congestion  Original Report Authenticated By: Judie Petit. Ruel Favors, M.D.   Treatments: IV hydration Antibiotics: Levaquin Analgesia: Dilaudid Anticoagulation: Lovenox Respiratory therapy: O2 and Nebulizer treatments Pprocedures: PICC line  Blood transfusion/blood exchange  Discharge Exam: Blood pressure 114/68, pulse 85, temperature 98.4 F (36.9 C), temperature source Oral, resp. rate 18, height 6' (1.829 m), weight 181 lb 3.2 oz (82.192 kg), SpO2 98.00%.  General Appearance: Alert, cooperative, no apparent distress  Head: Normocephalic, without obvious abnormality, atraumatic  Eyes: PERRLA, EOMI, the patient has slight right eye strabismus, scleral icterus  Nose: Nares normal, septum midline, mucosa normal, no drainage or sinus tenderness Throat: Normal lips, mucosa, tongue and gums, dental caries present  Back: Symmetric, bilateral CVA tenderness, diffuse tenderness  Resp: CTA, Diminished bibasilar breath sounds (RLL > LLL), no wheezes/rales/rhonchi  Cardio: S1, S2 regular, no murmurs/clicks/rubs/gallops  GI: Soft, non-tender, normoactive bowel sounds, no masses, no  organomegaly  Male Genitalia: Deferred, the patient  denies priapism  Extremities: Extremities normal, atraumatic, no cyanosis or edema, negative Homans sign, no sign of DVT  Pulses: 2+ and symmetric  Skin: Skin color, texture, turgor normal, no rashes or lesions  Neurologic: Grossly normal, CN II-XII intact, no focal deficits  Psych: Appropriate affect  Disposition: Home or Self Care  Discharge Orders    Future Orders Please Complete By Expires   Increase activity slowly      Discharge instructions      Comments:   Take all medications as prescribed.   Keep using incentive spirometer daily. Keep all follow up appointments. Get plenty of rest and hydration.   Call MD for:  severe uncontrolled pain        Medication List  As of 02/21/2011  9:23 AM   TAKE these medications         folic acid 1 MG tablet   Commonly known as: FOLVITE   Take 1 tablet (1 mg total) by mouth daily.      HYDROmorphone 4 MG tablet   Commonly known as: DILAUDID   Take 1 tablet (4 mg total) by mouth every 4 (four) hours as needed. For pain      hydroxyurea 500 MG capsule   Commonly known as: HYDREA   Take 1 capsule (500 mg total) by mouth 2 (two) times daily. May take with food to minimize GI side effects.      ibuprofen 800 MG tablet   Commonly known as: ADVIL,MOTRIN   Take 800 mg by mouth every 8 (eight) hours as needed. pain      levofloxacin 750 MG tablet   Commonly known as: LEVAQUIN   Take 1 tablet (750 mg total) by mouth daily.      OLANZapine 10 MG tablet   Commonly known as: ZYPREXA   Take 1 tablet (10 mg total) by mouth at bedtime.      oxyCODONE 40 MG 12 hr tablet   Commonly known as: OXYCONTIN   Take 1 tablet (40 mg total) by mouth every 12 (twelve) hours. pain      potassium chloride SA 20 MEQ tablet   Commonly known as: K-DUR,KLOR-CON   Take 20 mEq by mouth daily.      zolpidem 10 MG tablet   Commonly known as: AMBIEN   Take 1 tablet (10 mg total) by mouth at bedtime as  needed. sleep           Follow-up Information    Follow up with [name], MD. Schedule an appointment as soon as possible for a visit in 1 week. (Return to the emergency room if symptoms worsen)         The entire discharge summary, including medications and the importance of adherence to outpatient follow-up was discussed with the patient.  He stated he understood and agreed.  All of his questions were answered and his concerns addressed.  Discharge Time:  Greater than 30 minutes  Signed: Larina Bras 02/21/2011, 9:23 AM

## 2011-02-21 NOTE — Progress Notes (Signed)
Patient to discharge home.  IV nurse D/C'd Picc and prescriptions and discharge instructions were given.  Patient verbalized understanding.

## 2011-02-23 IMAGING — CR DG CHEST 2V
2 series · 2 of 2 positions shown · non-contrast
Comparison: PA and lateral chest 02/15/2008.

CLINICAL DATA: Chest pain.  Sickle cell disease.

CHEST - 2 VIEW

[w chest pa]
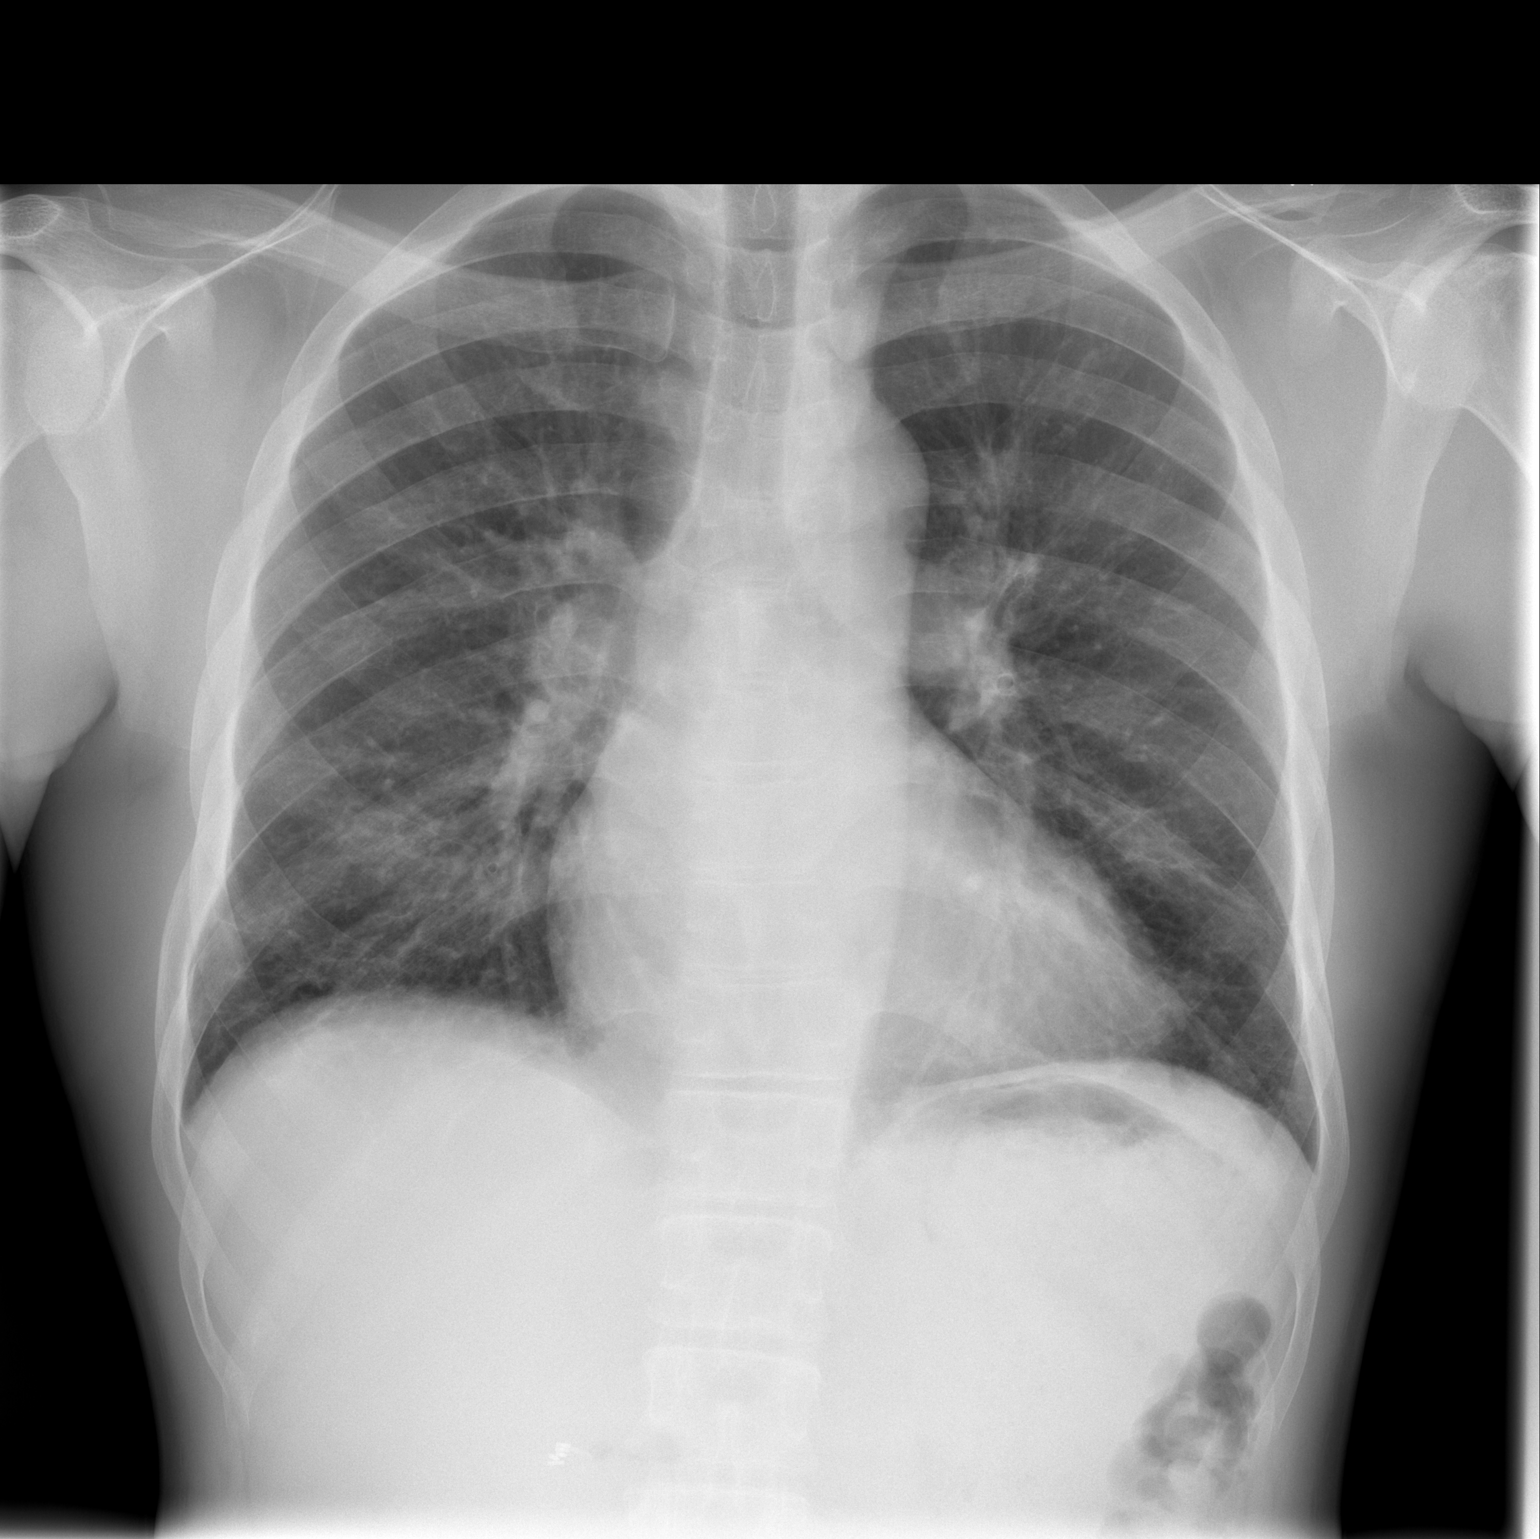

[w chest lat]
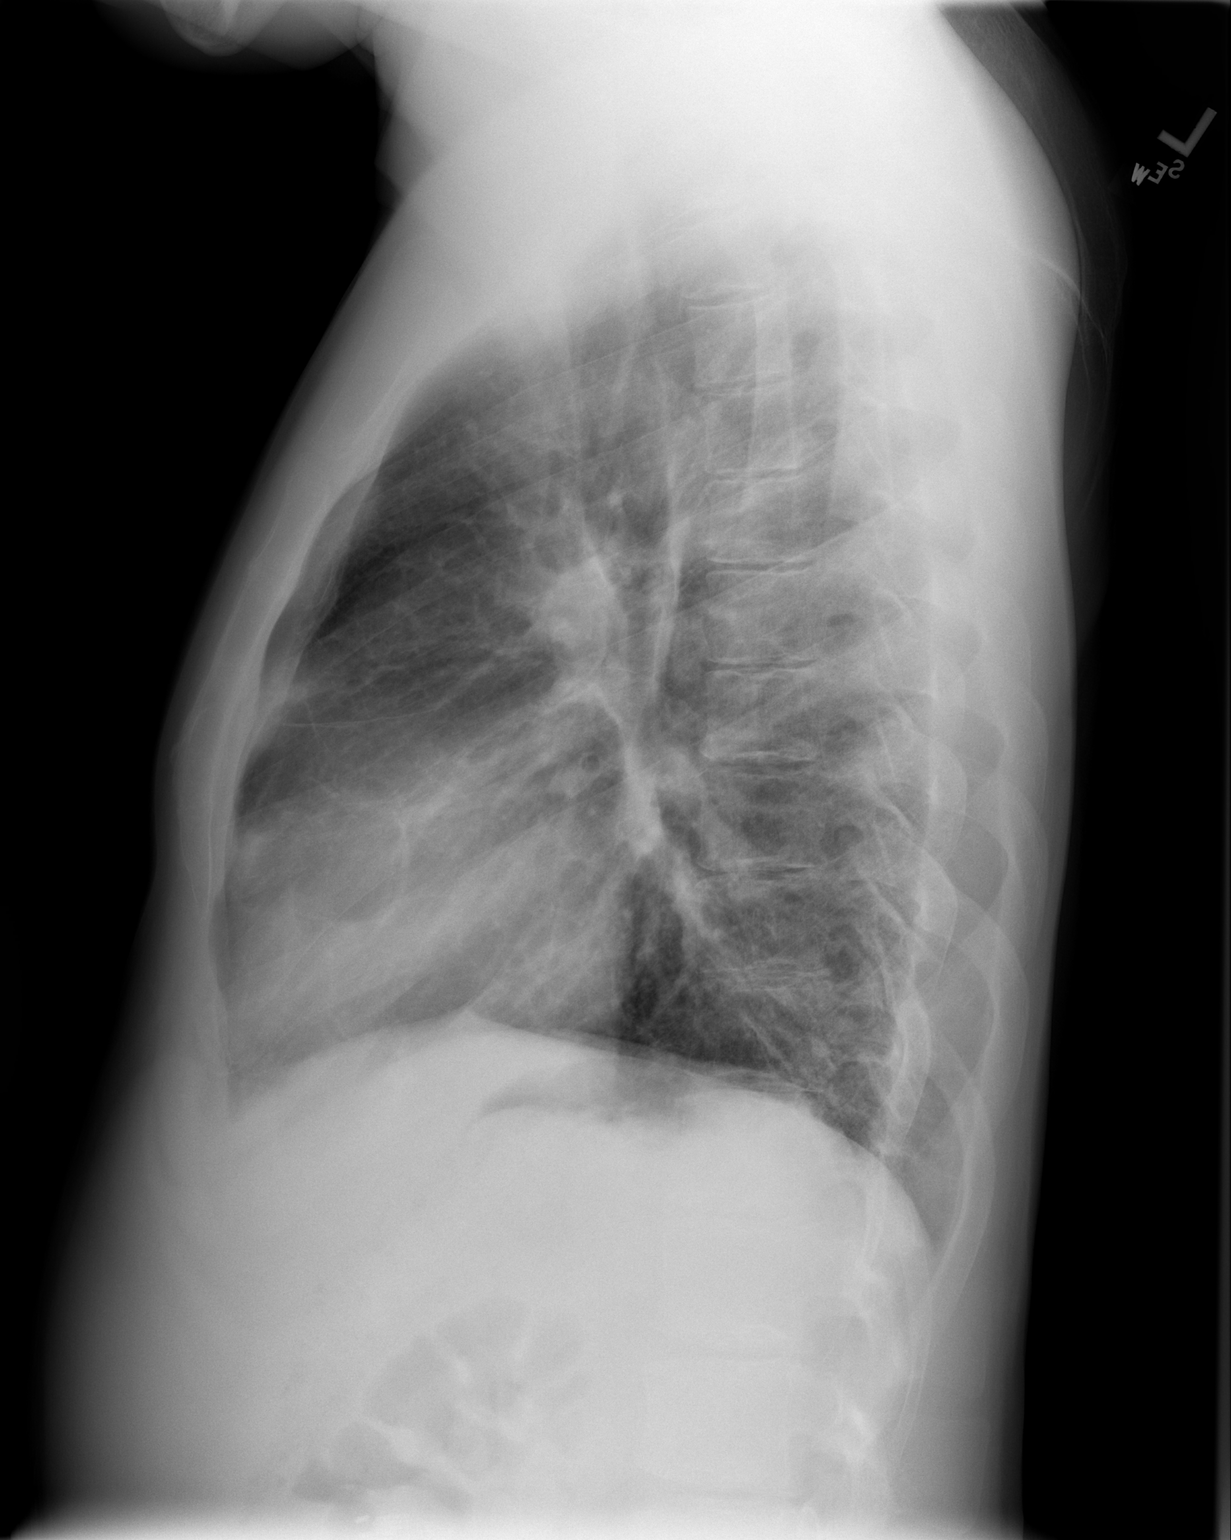

[2 of 2 positions shown; findings below may reference images not displayed]

FINDINGS: There is bibasilar linear scarring.  No focal airspace
disease or effusion.  Heart size upper normal.  The patient is
status post cholecystectomy.
IMPRESSION: No acute finding.  Stable compared to prior exam.

## 2011-02-23 IMAGING — CT CT ANGIO CHEST
3 of 8 series · 18 of 36 positions shown · IV contrast (APPLIED)
Comparison: CT chest 01/09/2008.

CLINICAL DATA: Chest pain and sent the patient is sickle cell
disease.

CT ANGIOGRAPHY CHEST
TECHNIQUE: Multidetector CT imaging of the chest using the
standard protocol during bolus administration of intravenous
contrast. Multiplanar reconstructed images including MIPs were
obtained and reviewed to evaluate the vascular anatomy.
Contrast: 100 ml [id number].

[Series 5: pulm embolism 3.0 b60f lung · axial · 0.63mm/px · z∈[-172,-90]mm · 2 of 82 slices shown]
[im 28/82  mediastinal]
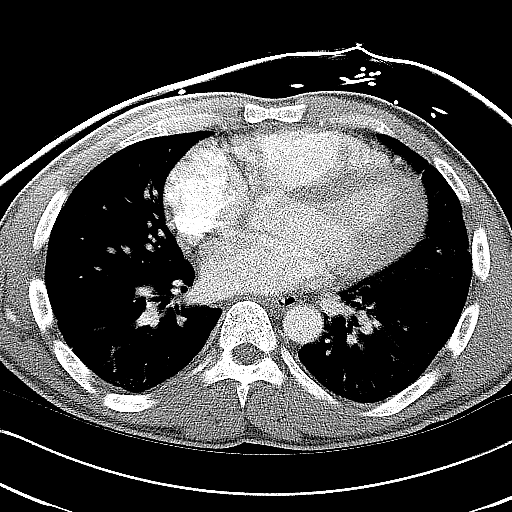
[im 55/82  mediastinal]
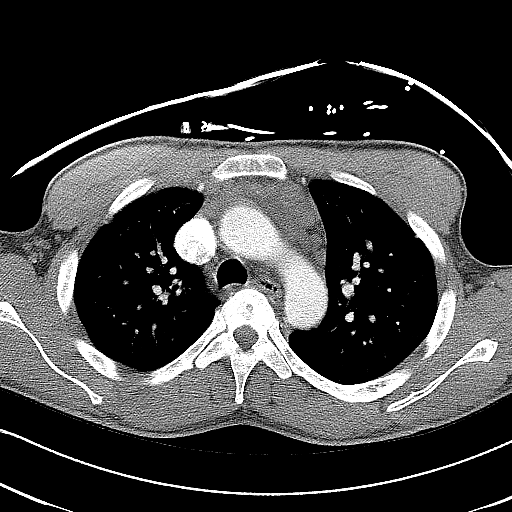

[Series 8: pulm embolism 1.0 b25f thins · axial · 0.63mm/px · z∈[-278,-28]mm · 15 of 286 slices shown]
[im 18/286  lung]
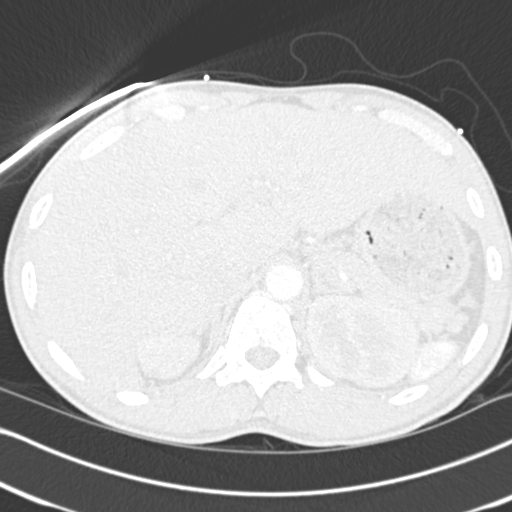
[im 36/286  mediastinal]
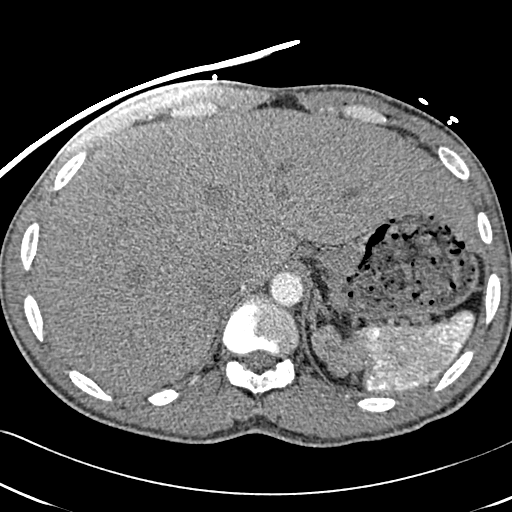
[im 54/286  lung]
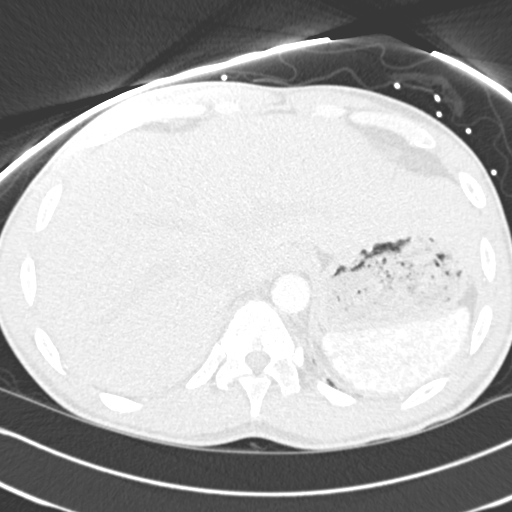
[im 72/286  mediastinal]
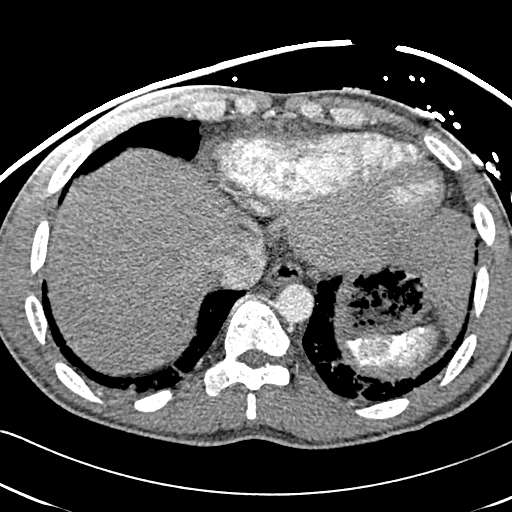
[im 90/286  lung]
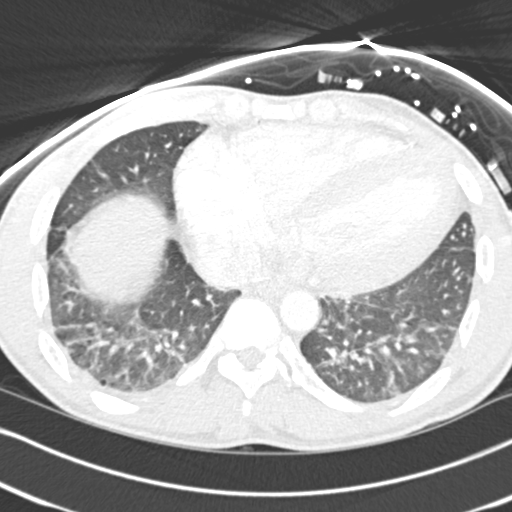
[im 107/286  mediastinal]
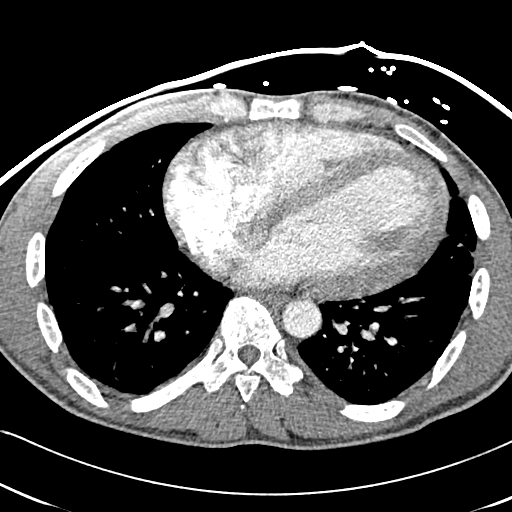
[im 125/286  lung]
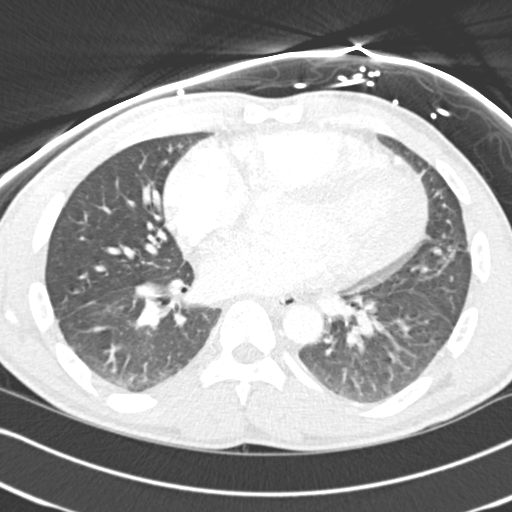
[im 143/286  mediastinal]
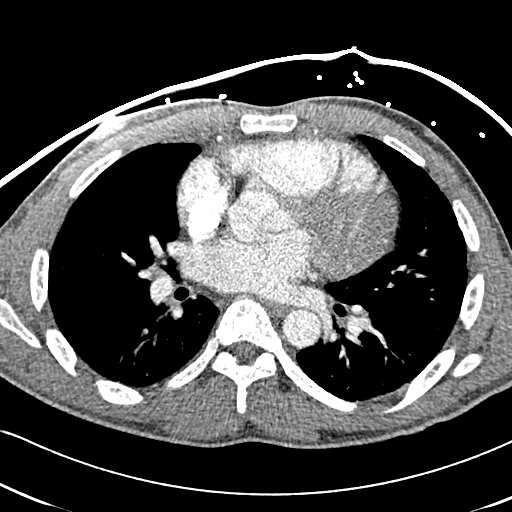
[im 161/286  lung]
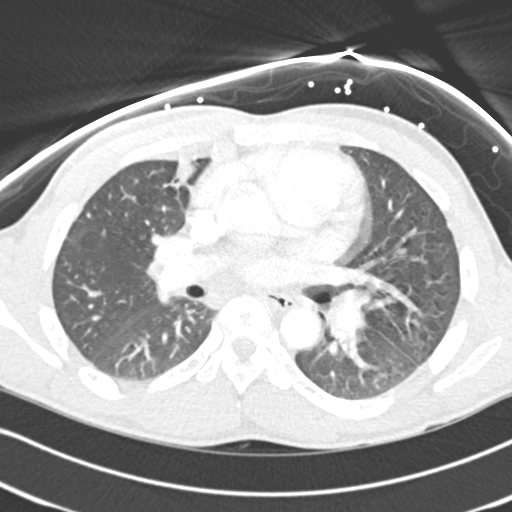
[im 179/286  mediastinal]
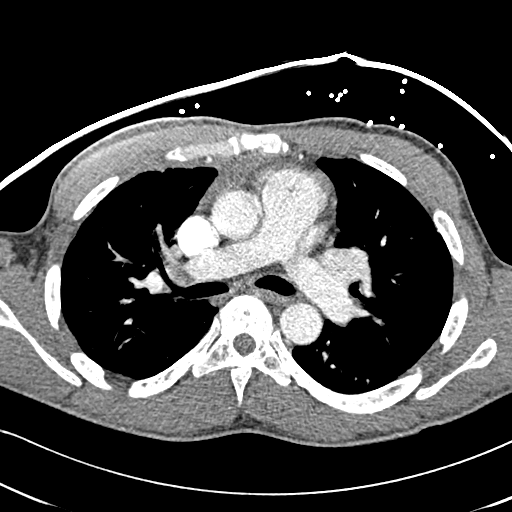
[im 196/286  lung]
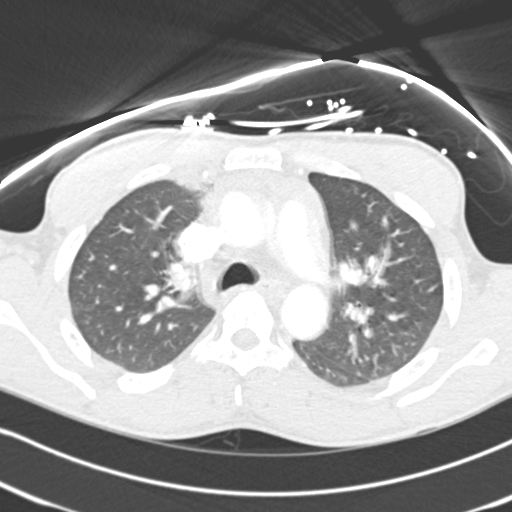
[im 214/286  mediastinal]
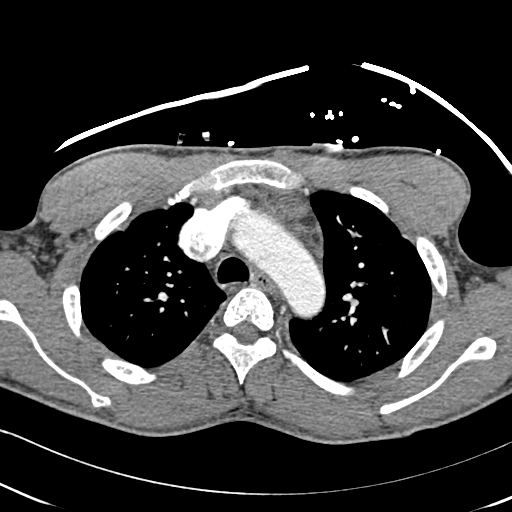
[im 232/286  lung]
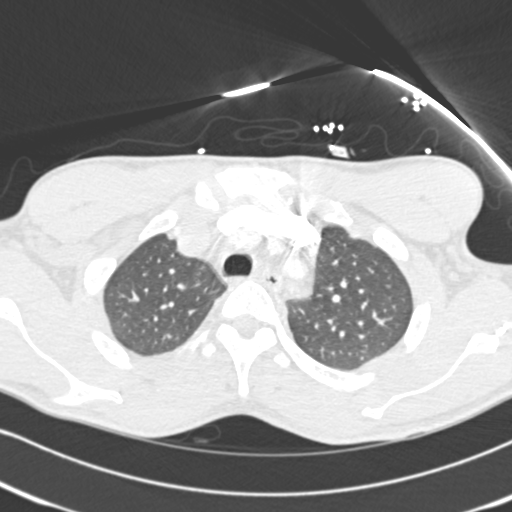
[im 250/286  mediastinal]
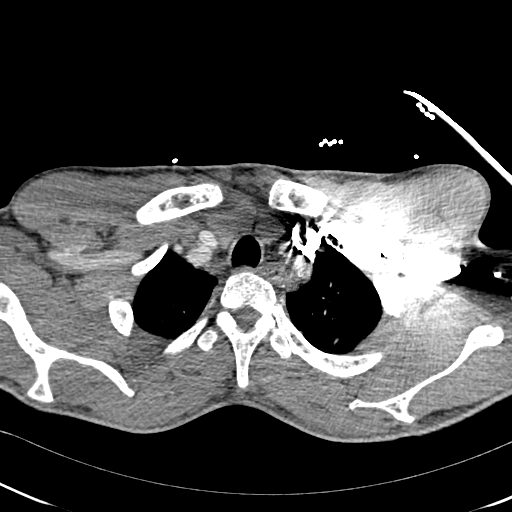
[im 268/286  lung]
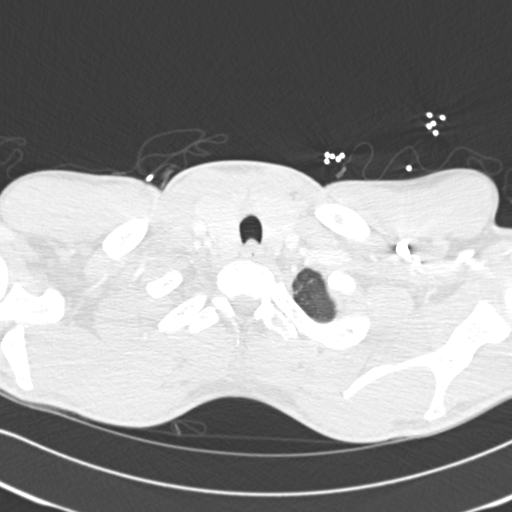

[Series 602: <mpr thick range> · coronal · 0.63mm/px · 1 of 116 slices shown]
[im 58/116  mediastinal]
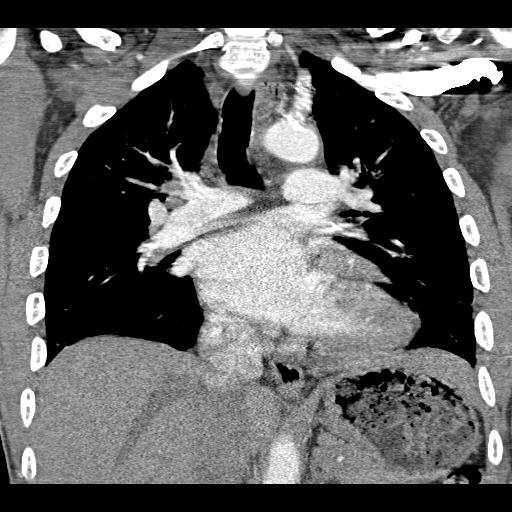

[18 of 36 positions shown; findings below may reference images not displayed]

FINDINGS: There is no CT evidence of pulmonary embolus.  Marked
cardiomegaly is again noted.  Small mediastinal lymph nodes are
unchanged.  No pleural or pericardial effusion.  Lungs demonstrate
some dependent atelectatic change but are otherwise unremarkable.
Incidentally imaged upper abdomen shows calcification the spleen
consistent with history of sickle cell disease.  Visualized upper
abdomen otherwise unremarkable.  Multi focal bony infarcts noted.
IMPRESSION: 1.  Negative for pulmonary embolus or acute cardiopulmonary
disease.
2.  Unchanged cardiomegaly.
3.  Auto- infarction in the spleen and bony infarcts consistent
with history of sickle cell disease again noted.
4.  For purposes of tracking radiation dose, note is made that this
is the 10th chest CT scan this patient has had at this institution
since November 2006.

## 2011-03-04 ENCOUNTER — Emergency Department (HOSPITAL_COMMUNITY)
Admission: EM | Admit: 2011-03-04 | Discharge: 2011-03-04 | Disposition: A | Discharge status: Home / Self Care | Payer: Medicare Other | Source: Home / Self Care | Attending: Emergency Medicine | Admitting: Emergency Medicine

## 2011-03-04 ENCOUNTER — Encounter (HOSPITAL_COMMUNITY): Payer: Self-pay | Admitting: *Deleted

## 2011-03-04 ENCOUNTER — Emergency Department (HOSPITAL_COMMUNITY): Payer: Medicare Other

## 2011-03-04 DIAGNOSIS — Z9089 Acquired absence of other organs: Secondary | ICD-10-CM | POA: Insufficient documentation

## 2011-03-04 DIAGNOSIS — Z885 Allergy status to narcotic agent status: Secondary | ICD-10-CM | POA: Diagnosis not present

## 2011-03-04 DIAGNOSIS — R110 Nausea: Secondary | ICD-10-CM | POA: Insufficient documentation

## 2011-03-04 DIAGNOSIS — M545 Low back pain, unspecified: Secondary | ICD-10-CM | POA: Diagnosis not present

## 2011-03-04 DIAGNOSIS — E876 Hypokalemia: Secondary | ICD-10-CM | POA: Diagnosis not present

## 2011-03-04 DIAGNOSIS — M79609 Pain in unspecified limb: Secondary | ICD-10-CM | POA: Insufficient documentation

## 2011-03-04 DIAGNOSIS — D571 Sickle-cell disease without crisis: Secondary | ICD-10-CM | POA: Diagnosis not present

## 2011-03-04 DIAGNOSIS — I517 Cardiomegaly: Secondary | ICD-10-CM | POA: Diagnosis not present

## 2011-03-04 DIAGNOSIS — J40 Bronchitis, not specified as acute or chronic: Secondary | ICD-10-CM | POA: Diagnosis not present

## 2011-03-04 DIAGNOSIS — Z832 Family history of diseases of the blood and blood-forming organs and certain disorders involving the immune mechanism: Secondary | ICD-10-CM | POA: Diagnosis not present

## 2011-03-04 DIAGNOSIS — D5700 Hb-SS disease with crisis, unspecified: Secondary | ICD-10-CM

## 2011-03-04 DIAGNOSIS — R079 Chest pain, unspecified: Secondary | ICD-10-CM | POA: Insufficient documentation

## 2011-03-04 DIAGNOSIS — Z87891 Personal history of nicotine dependence: Secondary | ICD-10-CM | POA: Diagnosis not present

## 2011-03-04 DIAGNOSIS — D473 Essential (hemorrhagic) thrombocythemia: Secondary | ICD-10-CM | POA: Diagnosis not present

## 2011-03-04 DIAGNOSIS — M87029 Idiopathic aseptic necrosis of unspecified humerus: Secondary | ICD-10-CM | POA: Diagnosis not present

## 2011-03-04 DIAGNOSIS — D57819 Other sickle-cell disorders with crisis, unspecified: Secondary | ICD-10-CM | POA: Insufficient documentation

## 2011-03-04 DIAGNOSIS — Z79899 Other long term (current) drug therapy: Secondary | ICD-10-CM | POA: Diagnosis not present

## 2011-03-04 DIAGNOSIS — R197 Diarrhea, unspecified: Secondary | ICD-10-CM | POA: Insufficient documentation

## 2011-03-04 DIAGNOSIS — F39 Unspecified mood [affective] disorder: Secondary | ICD-10-CM | POA: Diagnosis not present

## 2011-03-04 DIAGNOSIS — G8929 Other chronic pain: Secondary | ICD-10-CM | POA: Diagnosis not present

## 2011-03-04 DIAGNOSIS — D72829 Elevated white blood cell count, unspecified: Secondary | ICD-10-CM | POA: Diagnosis not present

## 2011-03-04 DIAGNOSIS — Z86718 Personal history of other venous thrombosis and embolism: Secondary | ICD-10-CM | POA: Diagnosis not present

## 2011-03-04 DIAGNOSIS — Z96649 Presence of unspecified artificial hip joint: Secondary | ICD-10-CM | POA: Diagnosis not present

## 2011-03-04 DIAGNOSIS — Z86711 Personal history of pulmonary embolism: Secondary | ICD-10-CM | POA: Diagnosis not present

## 2011-03-04 DIAGNOSIS — R748 Abnormal levels of other serum enzymes: Secondary | ICD-10-CM | POA: Diagnosis not present

## 2011-03-04 DIAGNOSIS — E739 Lactose intolerance, unspecified: Secondary | ICD-10-CM | POA: Diagnosis not present

## 2011-03-04 LAB — COMPREHENSIVE METABOLIC PANEL
AST: 22 U/L (ref 0–37)
Albumin: 4.1 g/dL (ref 3.5–5.2)
Alkaline Phosphatase: 101 U/L (ref 39–117)
BUN: 6 mg/dL (ref 6–23)
Creatinine, Ser: 0.52 mg/dL (ref 0.50–1.35)
Potassium: 3 mEq/L — ABNORMAL LOW (ref 3.5–5.1)
Total Protein: 7.9 g/dL (ref 6.0–8.3)

## 2011-03-04 LAB — URINALYSIS, ROUTINE W REFLEX MICROSCOPIC
Bilirubin Urine: NEGATIVE
Leukocytes, UA: NEGATIVE
Nitrite: NEGATIVE
Specific Gravity, Urine: 1.016 (ref 1.005–1.030)
Urobilinogen, UA: 1 mg/dL (ref 0.0–1.0)
pH: 6.5 (ref 5.0–8.0)

## 2011-03-04 LAB — CBC
HCT: 28.1 % — ABNORMAL LOW (ref 39.0–52.0)
Hemoglobin: 9.2 g/dL — ABNORMAL LOW (ref 13.0–17.0)
MCV: 92.4 fL (ref 78.0–100.0)
RBC: 3.04 MIL/uL — ABNORMAL LOW (ref 4.22–5.81)
WBC: 23.8 10*3/uL — ABNORMAL HIGH (ref 4.0–10.5)

## 2011-03-04 LAB — DIFFERENTIAL
Basophils Absolute: 0.2 10*3/uL — ABNORMAL HIGH (ref 0.0–0.1)
Basophils Relative: 1 % (ref 0–1)
Eosinophils Relative: 2 % (ref 0–5)
Lymphocytes Relative: 10 % — ABNORMAL LOW (ref 12–46)
Lymphs Abs: 2.4 10*3/uL (ref 0.7–4.0)
Monocytes Relative: 12 % (ref 3–12)
Neutro Abs: 17.8 10*3/uL — ABNORMAL HIGH (ref 1.7–7.7)

## 2011-03-04 LAB — RETICULOCYTES
RBC.: 3.04 MIL/uL — ABNORMAL LOW (ref 4.22–5.81)
Retic Count, Absolute: 279.7 10*3/uL — ABNORMAL HIGH (ref 19.0–186.0)

## 2011-03-04 MED ORDER — HYDROMORPHONE HCL PF 2 MG/ML IJ SOLN
2.0000 mg | Freq: Once | INTRAMUSCULAR | Status: AC
  Administered 2011-03-04: 2 mg via INTRAVENOUS
  Filled 2011-03-04: qty 1

## 2011-03-04 MED ORDER — ONDANSETRON HCL 4 MG/2ML IJ SOLN
4.0000 mg | Freq: Once | INTRAMUSCULAR | Status: AC
  Administered 2011-03-04: 4 mg via INTRAVENOUS
  Filled 2011-03-04: qty 2

## 2011-03-04 MED ORDER — SODIUM CHLORIDE 0.9 % IV SOLN: INTRAVENOUS | Status: DC

## 2011-03-04 MED ORDER — DIPHENHYDRAMINE HCL 50 MG/ML IJ SOLN
25.0000 mg | Freq: Once | INTRAMUSCULAR | Status: AC
  Administered 2011-03-04: 25 mg via INTRAVENOUS
  Filled 2011-03-04: qty 1

## 2011-03-04 MED ORDER — HYDROMORPHONE HCL PF 1 MG/ML IJ SOLN
1.0000 mg | Freq: Once | INTRAMUSCULAR | Status: AC
  Administered 2011-03-04: 1 mg via INTRAVENOUS
  Filled 2011-03-04: qty 1

## 2011-03-04 MED ORDER — SODIUM CHLORIDE 0.9 % IV BOLUS (SEPSIS)
1000.0000 mL | Freq: Once | INTRAVENOUS | Status: AC
  Administered 2011-03-04: 1000 mL via INTRAVENOUS

## 2011-03-04 NOTE — ED Notes (Signed)
Lab called to come draw blood. 2 RN attempted without success.

## 2011-03-04 NOTE — ED Notes (Signed)
Pt reports sickle cell crisis started yesterday. Pain 7/10 bilateral ribs and pain in bilateral thighs. Last sickle cell crisis was 1 month ago.

## 2011-03-04 NOTE — ED Notes (Signed)
Pt requested more pain medication. md at bedside to check on pt. md alerted of pt pain. More medication will be given.

## 2011-03-04 NOTE — ED Provider Notes (Signed)
History     CSN: [id number]  Arrival date & time 03/04/11  0804   First MD Initiated Contact with Patient 03/04/11 0813     8:21 AM HPI This reports began having sickle cell pain in his lower extremities yesterday. Reports pain is progressively worsened despite using PO hydromorphone, and oxycodone. Reports symptoms feel like prior sickle cell pain in the past. States pain is also associated with bilateral hip pain but denies chest pain or shortness of breath.  Patient is a 31 y.o. male presenting with sickle cell pain. The history is provided by the patient.  Sickle Cell Pain Crisis  This is a new problem. The current episode started yesterday. The onset was gradual. The problem occurs continuously. The problem has been gradually worsening. The pain is associated with an unknown factor. The pain is present in the lower extremities. The pain is similar to prior episodes. The pain is severe. The symptoms are relieved by nothing. The symptoms are not relieved by one or more prescription drugs. Associated symptoms include diarrhea and nausea. Pertinent negatives include no chest pain, no abdominal pain, no vomiting, no headaches, no back pain, no loss of sensation, no tingling, no weakness and no cough. There is no swelling present. There have been frequent pain crises. He has been treated with hydroxyurea.    Past Medical History  Diagnosis Date  . Sickle cell anemia   . Blood transfusion     Past Surgical History  Procedure Date  . Joint replacement     right hip  . Hip surgery     Family History  Problem Relation Age of Onset  . Sickle cell anemia Mother   . Sickle cell anemia Father   . Sickle cell trait Brother     History  Substance Use Topics  . Smoking status: Former Smoker -- 13 years    Quit date: 07/08/2010  . Smokeless tobacco: Never Used  . Alcohol Use: No      Review of Systems  Constitutional: Negative for fever and chills.  Respiratory: Negative for cough  and shortness of breath.   Cardiovascular: Negative for chest pain.  Gastrointestinal: Positive for nausea and diarrhea. Negative for vomiting and abdominal pain.  Musculoskeletal: Negative for back pain.       Lower extremity pain and bilateral rib pain.  Neurological: Negative for tingling, weakness and headaches.  All other systems reviewed and are negative.    Allergies  Morphine and related  Home Medications   Current Outpatient Rx  Name Route Sig Dispense Refill  . FOLIC ACID 1 MG PO TABS Oral Take 1 tablet (1 mg total) by mouth daily. 100 tablet 2  . HYDROMORPHONE HCL 4 MG PO TABS Oral Take 1 tablet (4 mg total) by mouth every 4 (four) hours as needed. For pain 60 tablet 0  . HYDROXYUREA 500 MG PO CAPS Oral Take 1 capsule (500 mg total) by mouth 2 (two) times daily. May take with food to minimize GI side effects. 60 capsule 1  . IBUPROFEN 800 MG PO TABS Oral Take 800 mg by mouth every 8 (eight) hours as needed. pain    . LEVOFLOXACIN 750 MG PO TABS Oral Take 1 tablet (750 mg total) by mouth daily. 10 tablet 0  . OLANZAPINE 10 MG PO TABS Oral Take 1 tablet (10 mg total) by mouth at bedtime. 30 tablet 1  . OXYCODONE HCL ER 40 MG PO TB12 Oral Take 1 tablet (40 mg total) by mouth  every 12 (twelve) hours. pain 40 tablet 0  . POTASSIUM CHLORIDE CRYS ER 20 MEQ PO TBCR Oral Take 20 mEq by mouth daily.      Marland Kitchen ZOLPIDEM TARTRATE 10 MG PO TABS Oral Take 1 tablet (10 mg total) by mouth at bedtime as needed. sleep 30 tablet 0    BP 126/78  Pulse 87  Temp(Src) 98.5 F (36.9 C) (Oral)  Resp 16  SpO2 98%  Physical Exam  Constitutional: He is oriented to person, place, and time. He appears well-developed and well-nourished.  HENT:  Head: Normocephalic and atraumatic.  Eyes: Conjunctivae are normal. Pupils are equal, round, and reactive to light.  Neck: Normal range of motion. Neck supple.  Cardiovascular: Normal rate, regular rhythm and normal heart sounds.  Exam reveals no gallop and  no friction rub.   No murmur heard. Pulmonary/Chest: Effort normal and breath sounds normal. No respiratory distress. He has no wheezes. He has no rales. He exhibits no tenderness.  Abdominal: Soft. Bowel sounds are normal. He exhibits no distension and no mass. There is no tenderness. There is no rebound and no guarding.  Musculoskeletal: Normal range of motion. He exhibits no edema and no tenderness.       Normal pedal and radial pulses. Normal distal sensation.  Neurological: He is alert and oriented to person, place, and time.  Skin: Skin is warm and dry. No rash noted. No erythema. No pallor.  Psychiatric: He has a normal mood and affect. His behavior is normal.    ED Course  Procedures   Results for orders placed during the hospital encounter of 03/04/11  CBC      Component Value Range   WBC 23.8 (*) 4.0 - 10.5 (K/uL)   RBC 3.04 (*) 4.22 - 5.81 (MIL/uL)   Hemoglobin 9.2 (*) 13.0 - 17.0 (g/dL)   HCT 16.1 (*) 09.6 - 52.0 (%)   MCV 92.4  78.0 - 100.0 (fL)   MCH 30.3  26.0 - 34.0 (pg)   MCHC 32.7  30.0 - 36.0 (g/dL)   RDW 04.5 (*) 40.9 - 15.5 (%)   Platelets 551 (*) 150 - 400 (K/uL)  DIFFERENTIAL      Component Value Range   Neutrophils Relative 75  43 - 77 (%)   Lymphocytes Relative 10 (*) 12 - 46 (%)   Monocytes Relative 12  3 - 12 (%)   Eosinophils Relative 2  0 - 5 (%)   Basophils Relative 1  0 - 1 (%)   Neutro Abs 17.8 (*) 1.7 - 7.7 (K/uL)   Lymphs Abs 2.4  0.7 - 4.0 (K/uL)   Monocytes Absolute 2.9 (*) 0.1 - 1.0 (K/uL)   Eosinophils Absolute 0.5  0.0 - 0.7 (K/uL)   Basophils Absolute 0.2 (*) 0.0 - 0.1 (K/uL)   WBC Morphology MILD LEFT SHIFT (1-5% METAS, OCC MYELO, OCC BANDS)    RETICULOCYTES      Component Value Range   Retic Ct Pct 9.2 (*) 0.4 - 3.1 (%)   RBC. 3.04 (*) 4.22 - 5.81 (MIL/uL)   Retic Count, Manual 279.7 (*) 19.0 - 186.0 (K/uL)  URINALYSIS, ROUTINE W REFLEX MICROSCOPIC      Component Value Range   Color, Urine YELLOW  YELLOW    APPearance CLEAR   CLEAR    Specific Gravity, Urine 1.016  1.005 - 1.030    pH 6.5  5.0 - 8.0    Glucose, UA NEGATIVE  NEGATIVE (mg/dL)   Hgb urine dipstick NEGATIVE  NEGATIVE    Bilirubin Urine NEGATIVE  NEGATIVE    Ketones, ur NEGATIVE  NEGATIVE (mg/dL)   Protein, ur NEGATIVE  NEGATIVE (mg/dL)   Urobilinogen, UA 1.0  0.0 - 1.0 (mg/dL)   Nitrite NEGATIVE  NEGATIVE    Leukocytes, UA NEGATIVE  NEGATIVE   COMPREHENSIVE METABOLIC PANEL      Component Value Range   Sodium 138  135 - 145 (mEq/L)   Potassium 3.0 (*) 3.5 - 5.1 (mEq/L)   Chloride 107  96 - 112 (mEq/L)   CO2 20  19 - 32 (mEq/L)   Glucose, Bld 100 (*) 70 - 99 (mg/dL)   BUN 6  6 - 23 (mg/dL)   Creatinine, Ser 1.61  0.50 - 1.35 (mg/dL)   Calcium 9.2  8.4 - 09.6 (mg/dL)   Total Protein 7.9  6.0 - 8.3 (g/dL)   Albumin 4.1  3.5 - 5.2 (g/dL)   AST 22  0 - 37 (U/L)   ALT 18  0 - 53 (U/L)   Alkaline Phosphatase 101  39 - 117 (U/L)   Total Bilirubin 2.8 (*) 0.3 - 1.2 (mg/dL)   GFR calc non Af Amer >90  >90 (mL/min)   GFR calc Af Amer >90  >90 (mL/min)   Dg Chest 2 View  03/04/2011  *RADIOLOGY REPORT*  Clinical Data: Bilateral rib pain.  History of sickle cell disease.  CHEST - 2 VIEW  Comparison: Multiple prior chest x-rays, most recently 02/08/2011.  Findings: Previously noted right upper extremity PICC has been removed.  Lung volumes are normal.  No consolidative airspace disease.  There is diffuse interstitial prominence, which appears similar to prior examinations, and likely reflects chronic lung changes related to underlying sickle cell disease.  No pleural effusions.  The heart size is mildly enlarged (unchanged). Mediastinal contours are unremarkable. Surgical clips in right upper quadrant abdomen.  Splenic contours is diminutive.  IMPRESSION:  1.  Diffuse interstitial prominence again noted, consistent with areas of chronic lung scarring related to underlying sickle cell disease.  No focal airspace opacities on today's examination to suggest acute  chest syndrome. 2.  Mild cardiomegaly unchanged. 3.  Status post cholecystectomy.  Original Report Authenticated By: Florencia Reasons, M.D.   Dg Chest 2 View  02/07/2011  *RADIOLOGY REPORT*  Clinical Data: Left posterior lateral and lower chest pain. Sickle cell disease.  CHEST - 2 VIEW  Comparison: 12/06 and 01/04/2011  Findings: Stable cardiomegaly.  Pulmonary vascularity is normal. Stable slight scarring at the lung bases.  No acute infiltrates or effusions.  No osseous abnormality.  IMPRESSION: No acute disease.  Original Report Authenticated By: Gwynn Burly, M.D.   Chest Portable 1 View Post Insertion To Confirm Placement As Interpreted By Radiologist  02/08/2011  *RADIOLOGY REPORT*  Clinical Data: PICC line placement, sickle cell disease  PORTABLE CHEST - 1 VIEW  Comparison: 02/07/2011  Findings: Right PICC line tip is at the innominate SVC junction. This could be advanced 6 cm to the SVC RA junction.  Heart is enlarged with vascular congestion and basilar atelectasis.  No focal pneumonia, collapse, consolidation, effusion or pneumothorax. Trachea midline.  IMPRESSION: Right PICC line tip at the innominate SVC junction, and could be advanced 6 cm.  Cardiomegaly with vascular congestion  Original Report Authenticated By: Judie Petit. Ruel Favors, M.D.     MDM    After 3 different doses of Dilaudid patient reports significant relief. Pain is now a 2/10. Ready for discharge. Labs do not show significant findings  chest x-ray does not indicate to test. Discussed this with patient patient agrees with plan and is ready for discharge   Thomasene Lot, PA-C 03/04/11 1320

## 2011-03-04 NOTE — ED Notes (Signed)
Lab in room drawing blood  

## 2011-03-04 NOTE — ED Notes (Signed)
Pt alert and oriented x4. Respirations even and unlabored, bilateral symmetrical rise and fall of chest. Skin warm and dry. In no acute distress. Denies needs.   

## 2011-03-04 NOTE — ED Provider Notes (Signed)
Medical screening examination/treatment/procedure(s) were conducted as a shared visit with non-physician practitioner(s) and myself.  I personally evaluated the patient during the encounter.  31 year old male with thigh and chest pain. Patient with a history of sickle cell. Says current symptoms feel very similar to previous. No fevers or chills. No shortness of breath. Reports compliance with his home medication. On exam patient is in no acute distress. He is afebrile. Lungs are clear he doesn't appear to be experiencing any respiratory distress. Patient is complaining of chest pain but says this is typical for him. Chest x-ray and clinical picture do not suggest acute chest syndrome. Abdominal exam benign. Plan symptomatic tx.  Raeford Razor, MD 03/16/11 [phone]

## 2011-03-05 ENCOUNTER — Emergency Department (HOSPITAL_COMMUNITY): Payer: Medicare Other

## 2011-03-05 ENCOUNTER — Other Ambulatory Visit: Payer: Self-pay

## 2011-03-05 ENCOUNTER — Inpatient Hospital Stay (HOSPITAL_COMMUNITY)
Admission: EM | Admit: 2011-03-05 | Discharge: 2011-03-11 | DRG: 812 | Disposition: A | Discharge status: Home / Self Care | Payer: Medicare Other | Attending: Internal Medicine | Admitting: Internal Medicine

## 2011-03-05 ENCOUNTER — Encounter (HOSPITAL_COMMUNITY): Payer: Self-pay | Admitting: *Deleted

## 2011-03-05 DIAGNOSIS — Z79899 Other long term (current) drug therapy: Secondary | ICD-10-CM | POA: Diagnosis not present

## 2011-03-05 DIAGNOSIS — E739 Lactose intolerance, unspecified: Secondary | ICD-10-CM | POA: Diagnosis present

## 2011-03-05 DIAGNOSIS — D5700 Hb-SS disease with crisis, unspecified: Secondary | ICD-10-CM | POA: Diagnosis not present

## 2011-03-05 DIAGNOSIS — R748 Abnormal levels of other serum enzymes: Secondary | ICD-10-CM | POA: Diagnosis not present

## 2011-03-05 DIAGNOSIS — M545 Low back pain, unspecified: Secondary | ICD-10-CM | POA: Diagnosis present

## 2011-03-05 DIAGNOSIS — R197 Diarrhea, unspecified: Secondary | ICD-10-CM | POA: Diagnosis present

## 2011-03-05 DIAGNOSIS — E876 Hypokalemia: Secondary | ICD-10-CM | POA: Diagnosis present

## 2011-03-05 DIAGNOSIS — R079 Chest pain, unspecified: Secondary | ICD-10-CM | POA: Diagnosis not present

## 2011-03-05 DIAGNOSIS — Z96649 Presence of unspecified artificial hip joint: Secondary | ICD-10-CM | POA: Diagnosis not present

## 2011-03-05 DIAGNOSIS — R000 Tachycardia, unspecified: Secondary | ICD-10-CM | POA: Diagnosis not present

## 2011-03-05 DIAGNOSIS — J40 Bronchitis, not specified as acute or chronic: Secondary | ICD-10-CM | POA: Diagnosis not present

## 2011-03-05 DIAGNOSIS — D571 Sickle-cell disease without crisis: Secondary | ICD-10-CM | POA: Diagnosis not present

## 2011-03-05 DIAGNOSIS — Z885 Allergy status to narcotic agent status: Secondary | ICD-10-CM

## 2011-03-05 DIAGNOSIS — Z86711 Personal history of pulmonary embolism: Secondary | ICD-10-CM | POA: Diagnosis not present

## 2011-03-05 DIAGNOSIS — Z86718 Personal history of other venous thrombosis and embolism: Secondary | ICD-10-CM | POA: Diagnosis not present

## 2011-03-05 DIAGNOSIS — G8929 Other chronic pain: Secondary | ICD-10-CM | POA: Diagnosis present

## 2011-03-05 DIAGNOSIS — Z832 Family history of diseases of the blood and blood-forming organs and certain disorders involving the immune mechanism: Secondary | ICD-10-CM

## 2011-03-05 DIAGNOSIS — D473 Essential (hemorrhagic) thrombocythemia: Secondary | ICD-10-CM | POA: Diagnosis present

## 2011-03-05 DIAGNOSIS — M87029 Idiopathic aseptic necrosis of unspecified humerus: Secondary | ICD-10-CM | POA: Diagnosis not present

## 2011-03-05 DIAGNOSIS — R7989 Other specified abnormal findings of blood chemistry: Secondary | ICD-10-CM

## 2011-03-05 DIAGNOSIS — F39 Unspecified mood [affective] disorder: Secondary | ICD-10-CM | POA: Diagnosis present

## 2011-03-05 DIAGNOSIS — I517 Cardiomegaly: Secondary | ICD-10-CM | POA: Diagnosis not present

## 2011-03-05 DIAGNOSIS — R9389 Abnormal findings on diagnostic imaging of other specified body structures: Secondary | ICD-10-CM | POA: Diagnosis not present

## 2011-03-05 DIAGNOSIS — D72829 Elevated white blood cell count, unspecified: Secondary | ICD-10-CM | POA: Diagnosis present

## 2011-03-05 DIAGNOSIS — Z87891 Personal history of nicotine dependence: Secondary | ICD-10-CM

## 2011-03-05 DIAGNOSIS — D75839 Thrombocytosis, unspecified: Secondary | ICD-10-CM | POA: Diagnosis present

## 2011-03-05 DIAGNOSIS — R110 Nausea: Secondary | ICD-10-CM | POA: Diagnosis not present

## 2011-03-05 DIAGNOSIS — D7289 Other specified disorders of white blood cells: Secondary | ICD-10-CM | POA: Diagnosis not present

## 2011-03-05 HISTORY — DX: Elevated white blood cell count, unspecified: D72.829

## 2011-03-05 HISTORY — DX: Unspecified mood (affective) disorder: F39

## 2011-03-05 HISTORY — DX: Idiopathic aseptic necrosis of unspecified bone: M87.00

## 2011-03-05 HISTORY — DX: Acute embolism and thrombosis of right internal jugular vein: I82.C11

## 2011-03-05 HISTORY — DX: Thrombocytosis, unspecified: D75.839

## 2011-03-05 HISTORY — DX: Hypokalemia: E87.6

## 2011-03-05 HISTORY — DX: Essential (hemorrhagic) thrombocythemia: D47.3

## 2011-03-05 LAB — BASIC METABOLIC PANEL
Chloride: 109 mEq/L (ref 96–112)
GFR calc Af Amer: >90 mL/min (ref >90)
Potassium: 3 mEq/L — ABNORMAL LOW (ref 3.5–5.1)
Sodium: 139 mEq/L (ref 135–145)

## 2011-03-05 LAB — RETICULOCYTES
RBC.: 2.91 MIL/uL — ABNORMAL LOW (ref 4.22–5.81)
Retic Count, Absolute: 285.2 10*3/uL — ABNORMAL HIGH (ref 19.0–186.0)

## 2011-03-05 LAB — CBC
HCT: 26.6 % — ABNORMAL LOW (ref 39.0–52.0)
MCH: 31.6 pg (ref 26.0–34.0)
MCV: 91.4 fL (ref 78.0–100.0)
RBC: 2.91 MIL/uL — ABNORMAL LOW (ref 4.22–5.81)
WBC: 24 10*3/uL — ABNORMAL HIGH (ref 4.0–10.5)

## 2011-03-05 LAB — DIFFERENTIAL
Eosinophils Relative: 3 % (ref 0–5)
Lymphs Abs: 3.1 10*3/uL (ref 0.7–4.0)
Monocytes Relative: 14 % — ABNORMAL HIGH (ref 3–12)
Neutro Abs: 16.6 10*3/uL — ABNORMAL HIGH (ref 1.7–7.7)

## 2011-03-05 MED ORDER — HYDROMORPHONE HCL PF 2 MG/ML IJ SOLN
2.0000 mg | Freq: Once | INTRAMUSCULAR | Status: AC
  Administered 2011-03-05: 2 mg via INTRAVENOUS
  Filled 2011-03-05: qty 1

## 2011-03-05 MED ORDER — HYDROMORPHONE HCL PF 2 MG/ML IJ SOLN
2.0000 mg | INTRAMUSCULAR | Status: DC | PRN
  Administered 2011-03-05 – 2011-03-06 (×11): 2 mg via INTRAVENOUS
  Filled 2011-03-05 (×3): qty 1
  Filled 2011-03-05: qty 2
  Filled 2011-03-05: qty 1
  Filled 2011-03-05 (×5): qty 2
  Filled 2011-03-05: qty 1

## 2011-03-05 MED ORDER — HYDROMORPHONE HCL PF 2 MG/ML IJ SOLN
INTRAMUSCULAR | Status: AC
  Filled 2011-03-05: qty 1

## 2011-03-05 MED ORDER — ONDANSETRON 8 MG PO TBDP
ORAL_TABLET | ORAL | Status: AC
  Administered 2011-03-05: 8 mg via ORAL
  Filled 2011-03-05: qty 1

## 2011-03-05 MED ORDER — ONDANSETRON 8 MG PO TBDP
8.0000 mg | ORAL_TABLET | Freq: Once | ORAL | Status: AC
  Administered 2011-03-05: 8 mg via ORAL

## 2011-03-05 MED ORDER — HYDROXYUREA 500 MG PO CAPS
500.0000 mg | ORAL_CAPSULE | Freq: Two times a day (BID) | ORAL | Status: DC
  Administered 2011-03-05 – 2011-03-11 (×12): 500 mg via ORAL
  Filled 2011-03-05 (×13): qty 1

## 2011-03-05 MED ORDER — ONDANSETRON HCL 4 MG/2ML IJ SOLN: 4.0000 mg | Freq: Once | INTRAMUSCULAR | Status: DC

## 2011-03-05 MED ORDER — HYDROMORPHONE HCL PF 2 MG/ML IJ SOLN
4.0000 mg | Freq: Once | INTRAMUSCULAR | Status: AC
  Administered 2011-03-05: 4 mg via INTRAMUSCULAR
  Filled 2011-03-05: qty 1

## 2011-03-05 MED ORDER — SODIUM CHLORIDE 0.9 % IJ SOLN
10.0000 mL | INTRAMUSCULAR | Status: DC | PRN
  Administered 2011-03-08: 10 mL

## 2011-03-05 MED ORDER — OXYCODONE HCL 40 MG PO TB12
40.0000 mg | ORAL_TABLET | Freq: Two times a day (BID) | ORAL | Status: DC
  Administered 2011-03-05 – 2011-03-11 (×12): 40 mg via ORAL
  Filled 2011-03-05 (×13): qty 1

## 2011-03-05 MED ORDER — POTASSIUM CHLORIDE IN NACL 20-0.45 MEQ/L-% IV SOLN
INTRAVENOUS | Status: DC
  Administered 2011-03-05 – 2011-03-10 (×8): via INTRAVENOUS
  Administered 2011-03-11: 50 mL via INTRAVENOUS
  Filled 2011-03-05 (×13): qty 1000

## 2011-03-05 MED ORDER — KETOROLAC TROMETHAMINE 60 MG/2ML IM SOLN
60.0000 mg | Freq: Once | INTRAMUSCULAR | Status: AC
  Administered 2011-03-05: 60 mg via INTRAMUSCULAR
  Filled 2011-03-05: qty 2

## 2011-03-05 MED ORDER — HYDROMORPHONE HCL PF 2 MG/ML IJ SOLN: 2.0000 mg | INTRAMUSCULAR | Status: DC | PRN

## 2011-03-05 MED ORDER — KETOROLAC TROMETHAMINE 15 MG/ML IJ SOLN
15.0000 mg | Freq: Four times a day (QID) | INTRAMUSCULAR | Status: AC
  Administered 2011-03-05 – 2011-03-10 (×18): 15 mg via INTRAVENOUS
  Filled 2011-03-05 (×22): qty 1

## 2011-03-05 MED ORDER — ONDANSETRON HCL 4 MG/2ML IJ SOLN
4.0000 mg | INTRAMUSCULAR | Status: DC | PRN
  Administered 2011-03-06: 4 mg via INTRAVENOUS
  Filled 2011-03-05: qty 2

## 2011-03-05 MED ORDER — OLANZAPINE 10 MG PO TABS
10.0000 mg | ORAL_TABLET | Freq: Every day | ORAL | Status: DC
  Administered 2011-03-05 – 2011-03-10 (×6): 10 mg via ORAL
  Filled 2011-03-05 (×7): qty 1

## 2011-03-05 MED ORDER — FOLIC ACID 1 MG PO TABS
1.0000 mg | ORAL_TABLET | Freq: Every day | ORAL | Status: DC
  Administered 2011-03-05 – 2011-03-11 (×7): 1 mg via ORAL
  Filled 2011-03-05 (×7): qty 1

## 2011-03-05 MED ORDER — POLYETHYLENE GLYCOL 3350 17 G PO PACK
17.0000 g | PACK | Freq: Every day | ORAL | Status: DC | PRN
  Filled 2011-03-05: qty 1

## 2011-03-05 MED ORDER — SODIUM CHLORIDE 0.9 % IV BOLUS (SEPSIS): 1000.0000 mL | Freq: Once | INTRAVENOUS | Status: DC

## 2011-03-05 MED ORDER — ONDANSETRON HCL 4 MG PO TABS: 4.0000 mg | ORAL_TABLET | ORAL | Status: DC | PRN

## 2011-03-05 MED ORDER — ZOLPIDEM TARTRATE 10 MG PO TABS
10.0000 mg | ORAL_TABLET | Freq: Every evening | ORAL | Status: DC | PRN
  Administered 2011-03-05 – 2011-03-10 (×6): 10 mg via ORAL
  Filled 2011-03-05 (×6): qty 1

## 2011-03-05 MED ORDER — POTASSIUM CHLORIDE CRYS ER 20 MEQ PO TBCR
20.0000 meq | EXTENDED_RELEASE_TABLET | Freq: Three times a day (TID) | ORAL | Status: DC
  Administered 2011-03-05 – 2011-03-10 (×16): 20 meq via ORAL
  Filled 2011-03-05 (×21): qty 1

## 2011-03-05 MED ORDER — ENOXAPARIN SODIUM 40 MG/0.4ML ~~LOC~~ SOLN
40.0000 mg | SUBCUTANEOUS | Status: DC
  Administered 2011-03-05 – 2011-03-10 (×6): 40 mg via SUBCUTANEOUS
  Filled 2011-03-05 (×7): qty 0.4

## 2011-03-05 MED ORDER — IBUPROFEN 800 MG PO TABS: 800.0000 mg | ORAL_TABLET | Freq: Three times a day (TID) | ORAL | Status: DC | PRN

## 2011-03-05 NOTE — ED Provider Notes (Signed)
History     CSN: [id number]  Arrival date & time 03/05/11  1133   First MD Initiated Contact with Patient 03/05/11 1151      Chief Complaint  Patient presents with  . Sickle Cell Pain Crisis    (Consider location/radiation/quality/duration/timing/severity/associated sxs/prior treatment) HPI Comments: Patient presenting with typical sickle cell pain in bilateral hips it woke him from sleep at 3 AM. It is not controlled with his home Dilaudid and oxycodone. He is seen in ED yesterday for the same problem and went home after 3 doses of IV pain medication. He says he felt better when he left. Denies any fever, vomiting, chest pain, shortness of breath or cough.  Patient is a 31 y.o. male presenting with sickle cell pain. The history is provided by the patient.  Sickle Cell Pain Crisis  The current episode started yesterday. The onset was gradual. The problem has been gradually worsening. The pain is present in the lower extremities. The pain is similar to prior episodes. The symptoms are not relieved by one or more OTC medications and one or more prescription drugs. The symptoms are aggravated by activity. Associated symptoms include back pain, joint pain and weakness. Pertinent negatives include no chest pain, no abdominal pain, no nausea, no vomiting, no dysuria, no hematuria, no congestion, no headaches, no rhinorrhea, no cough and no rash.    Past Medical History  Diagnosis Date  . Sickle cell anemia   . Blood transfusion     Past Surgical History  Procedure Date  . Joint replacement     right hip  . Hip surgery     Family History  Problem Relation Age of Onset  . Sickle cell anemia Mother   . Sickle cell anemia Father   . Sickle cell trait Brother     History  Substance Use Topics  . Smoking status: Former Smoker -- 13 years    Quit date: 07/08/2010  . Smokeless tobacco: Never Used  . Alcohol Use: No      Review of Systems  Constitutional: Negative for fever.    HENT: Negative for congestion and rhinorrhea.   Eyes: Negative for visual disturbance.  Respiratory: Negative for cough and shortness of breath.   Cardiovascular: Negative for chest pain.  Gastrointestinal: Negative for nausea, vomiting and abdominal pain.  Genitourinary: Negative for dysuria and hematuria.  Musculoskeletal: Positive for myalgias, back pain, joint pain and arthralgias.  Skin: Negative for rash.  Neurological: Positive for weakness. Negative for headaches.    Allergies  Morphine and related  Home Medications   Current Outpatient Rx  Name Route Sig Dispense Refill  . FOLIC ACID 1 MG PO TABS Oral Take 1 mg by mouth daily.    Marland Kitchen HYDROMORPHONE HCL 4 MG PO TABS Oral Take 4 mg by mouth every 4 (four) hours as needed. For pain    . HYDROXYUREA 500 MG PO CAPS Oral Take 500 mg by mouth 2 (two) times daily. May take with food to minimize GI side effects.    . IBUPROFEN 800 MG PO TABS Oral Take 800 mg by mouth every 8 (eight) hours as needed. For pain.    Marland Kitchen LEVOFLOXACIN 750 MG PO TABS Oral Take 750 mg by mouth daily. He took for 10 days. He started on 02/21/11 and completed on 03/02/11.    Marland Kitchen OLANZAPINE 10 MG PO TABS Oral Take 10 mg by mouth at bedtime.    . OXYCODONE HCL ER 40 MG PO TB12 Oral Take 40  mg by mouth every 12 (twelve) hours as needed. For pain.    Marland Kitchen POTASSIUM CHLORIDE CRYS ER 20 MEQ PO TBCR Oral Take 20 mEq by mouth daily.     Marland Kitchen ZOLPIDEM TARTRATE 10 MG PO TABS Oral Take 10 mg by mouth at bedtime as needed. For sleep.      BP 141/88  Pulse 102  Temp(Src) 97.9 F (36.6 C) (Oral)  Resp 16  Ht 6' (1.829 m)  Wt 175 lb (79.379 kg)  BMI 23.73 kg/m2  SpO2 98%  Physical Exam  Constitutional: He is oriented to person, place, and time. He appears well-developed and well-nourished. He appears distressed.  HENT:  Head: Normocephalic and atraumatic.  Mouth/Throat: Oropharynx is clear and moist. No oropharyngeal exudate.  Eyes: Conjunctivae and EOM are normal. Pupils are  equal, round, and reactive to light. Scleral icterus is present.       Pale conjunctiva  Neck: Neck supple.  Cardiovascular: Normal rate, regular rhythm and normal heart sounds.   No murmur heard.      Tachycardic  Pulmonary/Chest: Effort normal and breath sounds normal. No respiratory distress.  Abdominal: Soft. There is no tenderness. There is no rebound and no guarding.  Musculoskeletal: Normal range of motion. He exhibits no edema and no tenderness.       Intact DP and PT pulses, normal distal sensation  Neurological: He is alert and oriented to person, place, and time. No cranial nerve deficit.  Skin: Skin is warm.    ED Course  Procedures (including critical care time)  Labs Reviewed  CBC - Abnormal; Notable for the following:    WBC 24.0 (*)    RBC 2.91 (*)    Hemoglobin 9.2 (*)    HCT 26.6 (*)    RDW 18.0 (*)    Platelets 587 (*)    All other components within normal limits  DIFFERENTIAL - Abnormal; Notable for the following:    Monocytes Relative 14 (*)    Neutro Abs 16.6 (*)    Monocytes Absolute 3.4 (*)    Basophils Absolute 0.2 (*)    All other components within normal limits  RETICULOCYTES - Abnormal; Notable for the following:    Retic Ct Pct 9.8 (*)    RBC. 2.91 (*)    Retic Count, Manual 285.2 (*)    All other components within normal limits  BASIC METABOLIC PANEL - Abnormal; Notable for the following:    Potassium 3.0 (*)    Glucose, Bld 102 (*)    BUN 5 (*)    All other components within normal limits   Dg Chest 2 View  03/05/2011  *RADIOLOGY REPORT*  Clinical Data: Chest pain.  Sickle cell disease.  CHEST - 2 VIEW  Comparison: 03/04/2011  Findings: Heart size remains at upper limits of normal.  Diffuse pulmonary interstitial prominence is again seen.  Mild bibasilar scarring is stable.  There is no evidence of acute infiltrate or pleural effusion.  No mass or lymphadenopathy identified.  Surgical clips seen from prior cholecystectomy.  IMPRESSION: Stable  borderline cardiomegaly and diffuse pulmonary interstitial prominence.  No acute findings.  Original Report Authenticated By: Danae Orleans, M.D.   Dg Chest 2 View  03/04/2011  *RADIOLOGY REPORT*  Clinical Data: Bilateral rib pain.  History of sickle cell disease.  CHEST - 2 VIEW  Comparison: Multiple prior chest x-rays, most recently 02/08/2011.  Findings: Previously noted right upper extremity PICC has been removed.  Lung volumes are normal.  No consolidative  airspace disease.  There is diffuse interstitial prominence, which appears similar to prior examinations, and likely reflects chronic lung changes related to underlying sickle cell disease.  No pleural effusions.  The heart size is mildly enlarged (unchanged). Mediastinal contours are unremarkable. Surgical clips in right upper quadrant abdomen.  Splenic contours is diminutive.  IMPRESSION:  1.  Diffuse interstitial prominence again noted, consistent with areas of chronic lung scarring related to underlying sickle cell disease.  No focal airspace opacities on today's examination to suggest acute chest syndrome. 2.  Mild cardiomegaly unchanged. 3.  Status post cholecystectomy.  Original Report Authenticated By: Florencia Reasons, M.D.     1. Sickle cell crisis       MDM  Exacerbation of typical sickle cell pain similar to yesterday.  Pain still uncontrolled after multiple doses of narcotic pain medication. Given second visit in 2 days will admit. Hemoglobin stable reticulocyte count adequate. Difficult IV access, IM meds given.  Dr. Darnelle Catalan aware.    Angiocath insertion Performed by: Glynn Octave  Consent: Verbal consent obtained. Risks and benefits: risks, benefits and alternatives were discussed Time out: Immediately prior to procedure a "time out" was called to verify the correct patient, procedure, equipment, support staff and site/side marked as required.  Preparation: Patient was prepped and draped in the usual sterile  fashion.  Vein Location: R forearm  Y Ultrasound Guided  Gauge: 20.    Unsuccessful Patient tolerance: Patient tolerated the procedure well with no immediate complications.      Date: 03/05/2011  Rate: 80  Rhythm: normal sinus rhythm  QRS Axis: left  Intervals: normal  ST/T Wave abnormalities: nonspecific ST/T changes  Conduction Disutrbances:none  Narrative Interpretation:   Old EKG Reviewed: unchanged         Glynn Octave, MD 03/05/11 1521

## 2011-03-05 NOTE — H&P (Addendum)
Hospital Admission Note Date: 03/05/2011  Patient name: [name] Medical record number: [id number] Date of birth: Nov 24, 1980 Age: 31 y.o. Gender: male PCP: [name], MD, MD  Attending physician: Glynn Octave, MD Emergency Contact: [name] (mother) [phone] Code Status: Full  Chief Complaint: Back, leg and rib pain.  History of Present Illness: [name] is an 31 y.o. male with a PMH of sickle cell anemia who is well known to our service due to multiple hospital admissions for sickle cell anemia pain crisis.  He initially presented to the hospital yesterday, was treated with IV dilaudid and discharged home.  He returned today with ongoing pain in his back, legs and ribs which has not been alleviated by his home medications.  No associated nausea or vomiting.  No chest pain.  No fever or chills.  Past Medical History Past Medical History  Diagnosis Date  . Sickle cell anemia   . Blood transfusion   . 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 Hypokalemia   . Mood disorder   . Pulmonary embolism   . Avascular necrosis   . Leukocytosis     Chronic  . Thrombocytosis     Chronic    Past Surgical History Past Surgical History  Procedure Date  . Right hip replacement     08/2006  . Cholecystectomy     01/2008  . Porta cath placement   . Porta cath removal   . Umbilical hernia repair     01/2008  . Excision of left periauricular cyst     10/2009  . Excision of right ear lobe cyst with primary closur     11/2007    Meds: Prior to Admission medications   Medication Sig Start Date End Date Taking? Authorizing Provider  folic acid (FOLVITE) 1 MG tablet Take 1 mg by mouth daily.   Yes Willey Blade, MD  HYDROmorphone (DILAUDID) 4 MG tablet Take 4 mg by mouth every 4 (four) hours as needed. For pain   Yes Larina Bras, NP  hydroxyurea (HYDREA) 500 MG capsule Take 500 mg by mouth 2 (two) times daily. May take with food to  minimize GI side effects.   Yes Larina Bras, NP  ibuprofen (ADVIL,MOTRIN) 800 MG tablet Take 800 mg by mouth every 8 (eight) hours as needed. For pain.   Yes Historical Provider, MD  levofloxacin (LEVAQUIN) 750 MG tablet Take 750 mg by mouth daily. He took for 10 days. He started on 02/21/11 and completed on 03/02/11.   Yes Historical Provider, MD  OLANZapine (ZYPREXA) 10 MG tablet Take 10 mg by mouth at bedtime.   Yes Larina Bras, NP  oxyCODONE (OXYCONTIN) 40 MG 12 hr tablet Take 40 mg by mouth every 12 (twelve) hours as needed. For pain.   Yes Larina Bras, NP  potassium chloride SA (K-DUR,KLOR-CON) 20 MEQ tablet Take 20 mEq by mouth daily.    Yes Historical Provider, MD  zolpidem (AMBIEN) 10 MG tablet Take 10 mg by mouth at bedtime as needed. For sleep.   Yes Larina Bras, NP    Allergies: Morphine and related  Social History: History   Social History  . Marital Status: Single    Spouse Name: N/A    Number of Children: 0  . Years of Education: 13   Occupational History  . Unemployed    Social History Main Topics  . Smoking status: Former Smoker -- 13 years    Types: Cigarettes  Quit date: 07/08/2010  . Smokeless tobacco: Never Used  . Alcohol Use: No  . Drug Use: No     quit smoking 2011  . Sexually Active: Yes    Birth Control/ Protection: None   Other Topics Concern  . Not on file   Social History Narrative   Single.  Lives with girlfriend.      Family History:  Family History  Problem Relation Age of Onset  . Sickle cell anemia Mother   . Sickle cell anemia Father   . Sickle cell trait Brother     Review of Systems: Constitutional: No fever or chills;  Appetite normal until today; No weight loss.  HEENT: No blurry vision or diplopia, no pharyngitis or dysphagia CV: No chest pain or arrhythmia.  Resp: No SOB, no cough. GI: No N/V, + diarrhea x 1 week, no melena or hematochezia.  GU: No dysuria or hematuria.  MSK: no myalgias, +arthralgias.   Neuro:  No headache or focal neurological deficits.  Psych: No depression or anxiety.  Endo: No thyroid disease or DM.  Skin: No rashes or lesions.  Heme: No anemia or blood dyscrasia, + sickle cell disease.   Physical Exam: Blood pressure 141/88, pulse 102, temperature 97.9 F (36.6 C), temperature source Oral, resp. rate 16, height 6' (1.829 m), weight 79.379 kg (175 lb), SpO2 98.00%. BP 141/88  Pulse 102  Temp(Src) 97.9 F (36.6 C) (Oral)  Resp 16  Ht 6' (1.829 m)  Wt 79.379 kg (175 lb)  BMI 23.73 kg/m2  SpO2 98%  General Appearance:    Alert, cooperative, no distress, appears stated age  Head:    Normocephalic, without obvious abnormality, atraumatic  Eyes:    PERRL, conjunctiva/corneas with slight icterus, EOM's intact  Ears:    Normal TM's and external ear canals, both ears  Nose:   Nares normal, septum midline, mucosa normal, no drainage    or sinus tenderness  Throat:   Lips, mucosa, and tongue normal; teeth and gums normal  Neck:   Supple, symmetrical, trachea midline, no adenopathy;       thyroid:  No enlargement/tenderness/nodules; no carotid   bruit or JVD  Back:     Symmetric, no curvature, ROM normal, no CVA tenderness  Lungs:     Clear to auscultation bilaterally, respirations unlabored  Chest wall:    No tenderness or deformity  Heart:    Regular rate and rhythm, S1 and S2 normal, no murmur, rub   or gallop  Abdomen:     Soft, diffusely tender, bowel sounds active all four quadrants,    no masses, no organomegaly  Extremities:   Extremities normal, atraumatic, no cyanosis or edema  Pulses:   2+ and symmetric all extremities  Skin:   Skin color, texture, turgor normal, no rashes or lesions  Neurologic:   Non-focal   Lab results: Basic Metabolic Panel:  Lab 03/05/11 1610 03/04/11 0940  NA 139 138  K 3.0* 3.0*  CL 109 107  CO2 20 20  GLUCOSE 102* 100*  BUN 5* 6  CREATININE 0.63 0.52  CALCIUM 9.4 9.2  MG -- --  PHOS -- --   GFR Estimated Creatinine  Clearance: 146.8 ml/min (by C-G formula based on Cr of 0.63). Liver Function Tests:  Lab 03/04/11 0940  AST 22  ALT 18  ALKPHOS 101  BILITOT 2.8*  PROT 7.9  ALBUMIN 4.1    CBC:  Lab 03/05/11 1347 03/04/11 0940  WBC 24.0* 23.8*  NEUTROABS 16.6*  17.8*  HGB 9.2* 9.2*  HCT 26.6* 28.1*  MCV 91.4 92.4  PLT 587* 551*   Anemia work up  [employer] 03/05/11 1347 03/04/11 0940  VITAMINB12 -- --  FOLATE -- --  FERRITIN -- --  TIBC -- --  IRON -- --  RETICCTPCT 9.8* 9.2*    Imaging results:  Dg Chest 2 View  03/05/2011  *RADIOLOGY REPORT*  Clinical Data: Chest pain.  Sickle cell disease.  CHEST - 2 VIEW  Comparison: 03/04/2011  Findings: Heart size remains at upper limits of normal.  Diffuse pulmonary interstitial prominence is again seen.  Mild bibasilar scarring is stable.  There is no evidence of acute infiltrate or pleural effusion.  No mass or lymphadenopathy identified.  Surgical clips seen from prior cholecystectomy.  IMPRESSION: Stable borderline cardiomegaly and diffuse pulmonary interstitial prominence.  No acute findings.  Original Report Authenticated By: Danae Orleans, M.D.   Dg Chest 2 View  03/04/2011  *RADIOLOGY REPORT*  Clinical Data: Bilateral rib pain.  History of sickle cell disease.  CHEST - 2 VIEW  Comparison: Multiple prior chest x-rays, most recently 02/08/2011.  Findings: Previously noted right upper extremity PICC has been removed.  Lung volumes are normal.  No consolidative airspace disease.  There is diffuse interstitial prominence, which appears similar to prior examinations, and likely reflects chronic lung changes related to underlying sickle cell disease.  No pleural effusions.  The heart size is mildly enlarged (unchanged). Mediastinal contours are unremarkable. Surgical clips in right upper quadrant abdomen.  Splenic contours is diminutive.  IMPRESSION:  1.  Diffuse interstitial prominence again noted, consistent with areas of chronic lung scarring related to  underlying sickle cell disease.  No focal airspace opacities on today's examination to suggest acute chest syndrome. 2.  Mild cardiomegaly unchanged. 3.  Status post cholecystectomy.  Original Report Authenticated By: Florencia Reasons, M.D.    Assessment & Plan: Active Problems:  Sickle cell pain crisis We will admit the patient and start him on hypotonic IV fluids. We will start him on a pain medication regimen consisting of standing doses of Toradol as well as when necessary doses of Dilaudid-HP breakthrough pain. We will continue his OxyContin at 40 mg every 12 hours. His hemoglobin is stable and he does not require transfusion at this time. Will transfer the patient's care to Dr. [name] of the sickle cell team in the morning.  Leukocytosis Patient's leukocytosis is chronic. A chest x-ray does not show any evidence of pneumonia. He is not complaining of any symptoms suggestive of a urinary tract infection. He does have some diarrhea and we will therefore check a stool for Clostridium difficile PCR to rule out infectious colitis. He does report that he has been on antibiotics recently.  Hypokalemia Will increase his oral supplementation dose of potassium to 20 mEq 3 times daily and supplement him in his IV fluids as well. We'll check a magnesium level in the morning to ensure that he has not also hypomagnesemic.  Thrombocytosis This appears to be chronic. We'll monitor.  Diarrhea Check stools for C. Diff PCR given h/o recent antibiotics.  Hydrate and monitor.  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  Time Spent On Admission: One hour.  RAMA,CHRISTINA 03/05/2011, 3:51 PM Pager [phone]

## 2011-03-05 NOTE — ED Notes (Signed)
Patient is resting comfortably. 

## 2011-03-05 NOTE — ED Notes (Signed)
MD at bedside. 

## 2011-03-05 NOTE — ED Notes (Signed)
Pt from home c/o sickle cell pain in both sides and BLEs, worse in the left leg, pain woke pt up this morning at around 0300 and prescribed pain meds are not helping.

## 2011-03-05 NOTE — ED Notes (Signed)
Vital signs stable. 

## 2011-03-06 DIAGNOSIS — D72829 Elevated white blood cell count, unspecified: Secondary | ICD-10-CM | POA: Diagnosis not present

## 2011-03-06 DIAGNOSIS — D5700 Hb-SS disease with crisis, unspecified: Secondary | ICD-10-CM | POA: Diagnosis not present

## 2011-03-06 DIAGNOSIS — F39 Unspecified mood [affective] disorder: Secondary | ICD-10-CM | POA: Diagnosis not present

## 2011-03-06 DIAGNOSIS — E876 Hypokalemia: Secondary | ICD-10-CM | POA: Diagnosis not present

## 2011-03-06 DIAGNOSIS — D473 Essential (hemorrhagic) thrombocythemia: Secondary | ICD-10-CM | POA: Diagnosis not present

## 2011-03-06 DIAGNOSIS — M87029 Idiopathic aseptic necrosis of unspecified humerus: Secondary | ICD-10-CM | POA: Diagnosis not present

## 2011-03-06 LAB — MAGNESIUM: Magnesium: 1.7 mg/dL (ref 1.5–2.5)

## 2011-03-06 LAB — BASIC METABOLIC PANEL
BUN: 5 mg/dL — ABNORMAL LOW (ref 6–23)
CO2: 20 mEq/L (ref 19–32)
Calcium: 8.8 mg/dL (ref 8.4–10.5)
GFR calc non Af Amer: >90 mL/min (ref >90)
Glucose, Bld: 103 mg/dL — ABNORMAL HIGH (ref 70–99)
Potassium: 3.1 mEq/L — ABNORMAL LOW (ref 3.5–5.1)

## 2011-03-06 LAB — CBC
Hemoglobin: 8.4 g/dL — ABNORMAL LOW (ref 13.0–17.0)
MCH: 30.8 pg (ref 26.0–34.0)
MCHC: 33.7 g/dL (ref 30.0–36.0)
MCV: 91.2 fL (ref 78.0–100.0)

## 2011-03-06 MED ORDER — HYDROMORPHONE 0.3 MG/ML IV SOLN
INTRAVENOUS | Status: DC
  Administered 2011-03-06: 3.2 mg via INTRAVENOUS
  Administered 2011-03-06: 5.1 mg via INTRAVENOUS
  Administered 2011-03-06: 16:00:00 via INTRAVENOUS
  Administered 2011-03-07: 2.39 mg via INTRAVENOUS
  Administered 2011-03-07: 4.6 mg via INTRAVENOUS
  Administered 2011-03-07: 3.99 mg via INTRAVENOUS
  Administered 2011-03-07: 02:00:00 via INTRAVENOUS
  Filled 2011-03-06 (×3): qty 25

## 2011-03-06 MED ORDER — DIPHENHYDRAMINE HCL 50 MG/ML IJ SOLN: 12.5000 mg | Freq: Four times a day (QID) | INTRAMUSCULAR | Status: DC | PRN

## 2011-03-06 MED ORDER — ALIGN PO CAPS: 1.0000 | ORAL_CAPSULE | Freq: Every day | ORAL | Status: DC

## 2011-03-06 MED ORDER — DIPHENHYDRAMINE HCL 12.5 MG/5ML PO ELIX: 12.5000 mg | ORAL_SOLUTION | Freq: Four times a day (QID) | ORAL | Status: DC | PRN

## 2011-03-06 MED ORDER — HYDROMORPHONE HCL PF 2 MG/ML IJ SOLN: 2.0000 mg | INTRAMUSCULAR | Status: DC | PRN

## 2011-03-06 MED ORDER — SODIUM CHLORIDE 0.9 % IJ SOLN: 9.0000 mL | INTRAMUSCULAR | Status: DC | PRN

## 2011-03-06 MED ORDER — POTASSIUM CHLORIDE 10 MEQ/50ML IV SOLN
10.0000 meq | Freq: Once | INTRAVENOUS | Status: AC
  Administered 2011-03-06: 10 meq via INTRAVENOUS
  Filled 2011-03-06: qty 50

## 2011-03-06 MED ORDER — NALOXONE HCL 0.4 MG/ML IJ SOLN
0.4000 mg | INTRAMUSCULAR | Status: DC | PRN
  Filled 2011-03-06: qty 1

## 2011-03-06 MED ORDER — FLORA-Q PO CAPS
1.0000 | ORAL_CAPSULE | Freq: Every day | ORAL | Status: DC
  Administered 2011-03-06 – 2011-03-11 (×6): 1 via ORAL
  Filled 2011-03-06 (×6): qty 1

## 2011-03-06 MED ORDER — ONDANSETRON HCL 4 MG/2ML IJ SOLN: 4.0000 mg | Freq: Four times a day (QID) | INTRAMUSCULAR | Status: DC | PRN

## 2011-03-06 NOTE — Progress Notes (Signed)
Subjective:  Patient reports he's feeling better today than yesterday. He rates his pain as a 7/10. He is having bilateral side pains. He also had some cramps in his legs as well. He reports prior to coming to the hospital he had excessive diarrhea since coming out of the hospital the last time. He had occasional flushing episodes as well. There's been occasional night sweats. Denies significant cough or chest pains. He was taken his potassium supplement at home despite being extremely low when he came into the hospital. He reports his present pain regimen is not controlling him. He is willing to go on the PCA pump.   Allergies  Allergen Reactions  . Morphine And Related Rash   Current Facility-Administered Medications  Medication Dose Route Frequency Provider Last Rate Last Dose  . 0.45 % NaCl with KCl 20 mEq / L infusion   Intravenous Continuous [name], MD 75 mL/hr at 03/06/11 1825    . diphenhydrAMINE (BENADRYL) injection 12.5 mg  12.5 mg Intravenous Q6H PRN Willey Blade, MD       Or  . diphenhydrAMINE (BENADRYL) 12.5 MG/5ML elixir 12.5 mg  12.5 mg Oral Q6H PRN Willey Blade, MD      . enoxaparin (LOVENOX) injection 40 mg  40 mg Subcutaneous Q24H [name], MD   40 mg at 03/06/11 1822  . Flora-Q (FLORA-Q) Capsule 1 capsule  1 capsule Oral Daily Willey Blade, MD   1 capsule at 03/06/11 1821  . folic acid (FOLVITE) tablet 1 mg  1 mg Oral Daily [name], MD   1 mg at 03/06/11 0944  . HYDROmorphone (DILAUDID) injection 2 mg  2 mg Intravenous Q4H PRN Willey Blade, MD      . HYDROmorphone (DILAUDID) PCA injection 0.3 mg/mL   Intravenous Q4H Willey Blade, MD      . hydroxyurea (HYDREA) capsule 500 mg  500 mg Oral BID [name], MD   500 mg at 03/06/11 0944  . ibuprofen (ADVIL,MOTRIN) tablet 800 mg  800 mg Oral Q8H PRN [name], MD      . ketorolac (TORADOL) 15 MG/ML injection 15 mg  15 mg Intravenous Q6H Christina Rama, MD   15 mg at 03/06/11 1822  . naloxone [facility]) injection 0.4  mg  0.4 mg Intravenous PRN Willey Blade, MD       And  . sodium chloride 0.9 % injection 9 mL  9 mL Intravenous PRN Willey Blade, MD      . OLANZapine (ZYPREXA) tablet 10 mg  10 mg Oral QHS [name], MD   10 mg at 03/05/11 2142  . ondansetron (ZOFRAN) injection 4 mg  4 mg Intravenous Once Glynn Octave, MD      . ondansetron [facility]) tablet 4 mg  4 mg Oral Q4H PRN [name], MD       Or  . ondansetron (ZOFRAN) injection 4 mg  4 mg Intravenous Q4H PRN [name], MD   4 mg at 03/06/11 1035  . ondansetron (ZOFRAN) injection 4 mg  4 mg Intravenous Q6H PRN Willey Blade, MD      . oxyCODONE (OXYCONTIN) 12 hr tablet 40 mg  40 mg Oral Q12H [name], MD   40 mg at 03/06/11 0945  . polyethylene glycol (MIRALAX / GLYCOLAX) packet 17 g  17 g Oral Daily PRN Christina Rama, MD      . potassium chloride 10 mEq in 50 mL *CENTRAL LINE* IVPB  10 mEq Intravenous Once Willey Blade, MD   10  mEq at 03/06/11 1621  . potassium chloride SA (K-DUR,KLOR-CON) CR tablet 20 mEq  20 mEq Oral TID [name], MD   20 mEq at 03/06/11 1627  . sodium chloride 0.9 % bolus 1,000 mL  1,000 mL Intravenous Once Glynn Octave, MD      . sodium chloride 0.9 % injection 10-40 mL  10-40 mL Intracatheter PRN [name], MD      . zolpidem (AMBIEN) tablet 10 mg  10 mg Oral QHS PRN [name], MD   10 mg at 03/05/11 2152  . DISCONTD: bifidobacterium infantis (ALIGN) capsule 1 capsule  1 capsule Oral Daily Willey Blade, MD      . DISCONTD: HYDROmorphone (DILAUDID) injection 2 mg  2 mg Intravenous Q2H PRN [name], MD   2 mg at 03/06/11 1452    Objective: Blood pressure 117/82, pulse 78, temperature 97.9 F (36.6 C), temperature source Oral, resp. rate 20, height 6' (1.829 m), weight 179 lb 0.2 oz (81.2 kg), SpO2 97.00%.  Well-developed well-nourished black male in no acute distress. HEENT:no sinus tenderness. NECK:no posterior cervical nodes. LUNGS:clear to auscultation. No vocal fremitus. No CVA  tenderness. ZO:XWRUEA S1, S2 without S3. Bilateral chest wall tenderness along the costochondral region. ABD:no epigastric tenderness. VWU:JWJXBJYN Homans. No edema. NEURO:intact.  Lab results: Results for orders placed during the hospital encounter of 03/05/11 (from the past 48 hour(s))  CBC     Status: Abnormal   Collection Time   03/05/11  1:47 PM      Component Value Range Comment   WBC 24.0 (*) 4.0 - 10.5 (K/uL)    RBC 2.91 (*) 4.22 - 5.81 (MIL/uL)    Hemoglobin 9.2 (*) 13.0 - 17.0 (g/dL)    HCT 82.9 (*) 56.2 - 52.0 (%)    MCV 91.4  78.0 - 100.0 (fL)    MCH 31.6  26.0 - 34.0 (pg)    MCHC 34.6  30.0 - 36.0 (g/dL)    RDW 13.0 (*) 86.5 - 15.5 (%)    Platelets 587 (*) 150 - 400 (K/uL)   DIFFERENTIAL     Status: Abnormal   Collection Time   03/05/11  1:47 PM      Component Value Range Comment   Neutrophils Relative 69  43 - 77 (%)    Lymphocytes Relative 13  12 - 46 (%)    Monocytes Relative 14 (*) 3 - 12 (%)    Eosinophils Relative 3  0 - 5 (%)    Basophils Relative 1  0 - 1 (%)    Neutro Abs 16.6 (*) 1.7 - 7.7 (K/uL)    Lymphs Abs 3.1  0.7 - 4.0 (K/uL)    Monocytes Absolute 3.4 (*) 0.1 - 1.0 (K/uL)    Eosinophils Absolute 0.7  0.0 - 0.7 (K/uL)    Basophils Absolute 0.2 (*) 0.0 - 0.1 (K/uL)    RBC Morphology TARGET CELLS      WBC Morphology WHITE COUNT CONFIRMED ON SMEAR     RETICULOCYTES     Status: Abnormal   Collection Time   03/05/11  1:47 PM      Component Value Range Comment   Retic Ct Pct 9.8 (*) 0.4 - 3.1 (%)    RBC. 2.91 (*) 4.22 - 5.81 (MIL/uL)    Retic Count, Manual 285.2 (*) 19.0 - 186.0 (K/uL)   BASIC METABOLIC PANEL     Status: Abnormal   Collection Time   03/05/11  1:47 PM      Component Value  Range Comment   Sodium 139  135 - 145 (mEq/L)    Potassium 3.0 (*) 3.5 - 5.1 (mEq/L)    Chloride 109  96 - 112 (mEq/L)    CO2 20  19 - 32 (mEq/L)    Glucose, Bld 102 (*) 70 - 99 (mg/dL)    BUN 5 (*) 6 - 23 (mg/dL)    Creatinine, Ser 1.61  0.50 - 1.35 (mg/dL)     Calcium 9.4  8.4 - 10.5 (mg/dL)    GFR calc non Af Amer >90  >90 (mL/min)    GFR calc Af Amer >90  >90 (mL/min)   BASIC METABOLIC PANEL     Status: Abnormal   Collection Time   03/06/11  4:00 AM      Component Value Range Comment   Sodium 138  135 - 145 (mEq/L)    Potassium 3.1 (*) 3.5 - 5.1 (mEq/L)    Chloride 109  96 - 112 (mEq/L)    CO2 20  19 - 32 (mEq/L)    Glucose, Bld 103 (*) 70 - 99 (mg/dL)    BUN 5 (*) 6 - 23 (mg/dL)    Creatinine, Ser 0.96  0.50 - 1.35 (mg/dL)    Calcium 8.8  8.4 - 10.5 (mg/dL)    GFR calc non Af Amer >90  >90 (mL/min)    GFR calc Af Amer >90  >90 (mL/min)   CBC     Status: Abnormal   Collection Time   03/06/11  4:00 AM      Component Value Range Comment   WBC 24.2 (*) 4.0 - 10.5 (K/uL)    RBC 2.73 (*) 4.22 - 5.81 (MIL/uL)    Hemoglobin 8.4 (*) 13.0 - 17.0 (g/dL)    HCT 04.5 (*) 40.9 - 52.0 (%)    MCV 91.2  78.0 - 100.0 (fL)    MCH 30.8  26.0 - 34.0 (pg)    MCHC 33.7  30.0 - 36.0 (g/dL)    RDW 81.1 (*) 91.4 - 15.5 (%)    Platelets 551 (*) 150 - 400 (K/uL)   MAGNESIUM     Status: Normal   Collection Time   03/06/11  4:00 AM      Component Value Range Comment   Magnesium 1.7  1.5 - 2.5 (mg/dL)     Studies/Results: Dg Chest 2 View  03/05/2011  *RADIOLOGY REPORT*  Clinical Data: Chest pain.  Sickle cell disease.  CHEST - 2 VIEW  Comparison: 03/04/2011  Findings: Heart size remains at upper limits of normal.  Diffuse pulmonary interstitial prominence is again seen.  Mild bibasilar scarring is stable.  There is no evidence of acute infiltrate or pleural effusion.  No mass or lymphadenopathy identified.  Surgical clips seen from prior cholecystectomy.  IMPRESSION: Stable borderline cardiomegaly and diffuse pulmonary interstitial prominence.  No acute findings.  Original Report Authenticated By: Danae Orleans, M.D.    Patient Active Problem List  Diagnoses  . Sickle cell pain crisis  . Leukocytosis  . Chronic pain  . Hypokalemia  . Metabolic acidosis  .  Thrombocytosis  . Hemochromatosis  . Hypomagnesemia  . Acute chest syndrome due to sickle cell crisis  . Diarrhea    Impression: Sickle cell crisis. Hypokalemia, multifactorial. Diarrhea question etiology. This note has improved since coming into the hospital. Lactose intolerance. Sickle cell lung disease. History acute chest syndrome.   Plan: Change to PCA Dilaudid. KCl 10 mg IV. Continue or replacement. Place patient on Probiotic Align daily.  Stool specimens for C. Difficile if diarrhea returns. Followup CBC and CMET in a.m.   [name], [name] 03/06/2011 9:05 PM

## 2011-03-07 DIAGNOSIS — E876 Hypokalemia: Secondary | ICD-10-CM | POA: Diagnosis not present

## 2011-03-07 DIAGNOSIS — D72829 Elevated white blood cell count, unspecified: Secondary | ICD-10-CM | POA: Diagnosis not present

## 2011-03-07 DIAGNOSIS — D5700 Hb-SS disease with crisis, unspecified: Secondary | ICD-10-CM | POA: Diagnosis not present

## 2011-03-07 DIAGNOSIS — F39 Unspecified mood [affective] disorder: Secondary | ICD-10-CM | POA: Diagnosis not present

## 2011-03-07 DIAGNOSIS — D473 Essential (hemorrhagic) thrombocythemia: Secondary | ICD-10-CM | POA: Diagnosis not present

## 2011-03-07 DIAGNOSIS — M87029 Idiopathic aseptic necrosis of unspecified humerus: Secondary | ICD-10-CM | POA: Diagnosis not present

## 2011-03-07 LAB — DIFFERENTIAL
Eosinophils Relative: 7 % — ABNORMAL HIGH (ref 0–5)
Lymphs Abs: 5.1 10*3/uL — ABNORMAL HIGH (ref 0.7–4.0)
Monocytes Absolute: 2.7 10*3/uL — ABNORMAL HIGH (ref 0.1–1.0)

## 2011-03-07 LAB — CBC
MCH: 30.4 pg (ref 26.0–34.0)
MCV: 90.4 fL (ref 78.0–100.0)
Platelets: 532 10*3/uL — ABNORMAL HIGH (ref 150–400)
RBC: 2.6 MIL/uL — ABNORMAL LOW (ref 4.22–5.81)
RDW: 17.5 % — ABNORMAL HIGH (ref 11.5–15.5)

## 2011-03-07 LAB — COMPREHENSIVE METABOLIC PANEL
Alkaline Phosphatase: 87 U/L (ref 39–117)
BUN: 6 mg/dL (ref 6–23)
CO2: 21 mEq/L (ref 19–32)
Chloride: 108 mEq/L (ref 96–112)
GFR calc Af Amer: >90 mL/min (ref >90)
GFR calc non Af Amer: >90 mL/min (ref >90)
Glucose, Bld: 92 mg/dL (ref 70–99)
Potassium: 4.1 mEq/L (ref 3.5–5.1)
Total Bilirubin: 2.9 mg/dL — ABNORMAL HIGH (ref 0.3–1.2)
Total Protein: 7.1 g/dL (ref 6.0–8.3)

## 2011-03-07 LAB — PREPARE RBC (CROSSMATCH)

## 2011-03-07 MED ORDER — DIPHENHYDRAMINE HCL 12.5 MG/5ML PO ELIX: 12.5000 mg | ORAL_SOLUTION | Freq: Four times a day (QID) | ORAL | Status: DC | PRN

## 2011-03-07 MED ORDER — PANTOPRAZOLE SODIUM 40 MG PO TBEC
40.0000 mg | DELAYED_RELEASE_TABLET | Freq: Every day | ORAL | Status: DC
Start: 2011-03-08 — End: 2011-03-11
  Administered 2011-03-08 – 2011-03-11 (×4): 40 mg via ORAL
  Filled 2011-03-07 (×5): qty 1

## 2011-03-07 MED ORDER — HYDROMORPHONE HCL PF 2 MG/ML IJ SOLN
2.0000 mg | INTRAMUSCULAR | Status: DC
  Administered 2011-03-07 – 2011-03-08 (×7): 4 mg via INTRAVENOUS
  Administered 2011-03-08: 2 mg via INTRAVENOUS
  Administered 2011-03-08 – 2011-03-09 (×7): 4 mg via INTRAVENOUS
  Administered 2011-03-09: 2 mg via INTRAVENOUS
  Administered 2011-03-09 (×2): 4 mg via INTRAVENOUS
  Administered 2011-03-09 (×3): 2 mg via INTRAVENOUS
  Administered 2011-03-09: 4 mg via INTRAVENOUS
  Administered 2011-03-09 (×2): 2 mg via INTRAVENOUS
  Administered 2011-03-09 – 2011-03-10 (×4): 4 mg via INTRAVENOUS
  Administered 2011-03-10: 2 mg via INTRAVENOUS
  Administered 2011-03-10: 4 mg via INTRAVENOUS
  Administered 2011-03-10: 2 mg via INTRAVENOUS
  Administered 2011-03-10 (×2): 4 mg via INTRAVENOUS
  Administered 2011-03-10: 2 mg via INTRAVENOUS
  Administered 2011-03-10 (×5): 4 mg via INTRAVENOUS
  Administered 2011-03-11: 2 mg via INTRAVENOUS
  Administered 2011-03-11 (×3): 4 mg via INTRAVENOUS
  Administered 2011-03-11: 2 mg via INTRAVENOUS
  Administered 2011-03-11 (×3): 4 mg via INTRAVENOUS
  Filled 2011-03-07 (×2): qty 2
  Filled 2011-03-07: qty 1
  Filled 2011-03-07 (×2): qty 2
  Filled 2011-03-07: qty 1
  Filled 2011-03-07 (×2): qty 2
  Filled 2011-03-07: qty 1
  Filled 2011-03-07 (×5): qty 2
  Filled 2011-03-07: qty 1
  Filled 2011-03-07 (×4): qty 2
  Filled 2011-03-07 (×2): qty 1
  Filled 2011-03-07 (×5): qty 2
  Filled 2011-03-07: qty 1
  Filled 2011-03-07: qty 2
  Filled 2011-03-07: qty 1
  Filled 2011-03-07 (×9): qty 2
  Filled 2011-03-07: qty 1
  Filled 2011-03-07 (×4): qty 2
  Filled 2011-03-07: qty 1
  Filled 2011-03-07 (×2): qty 2

## 2011-03-07 MED ORDER — HYDROMORPHONE HCL PF 2 MG/ML IJ SOLN
2.0000 mg | Freq: Once | INTRAMUSCULAR | Status: AC
  Administered 2011-03-07: 4 mg via INTRAVENOUS
  Filled 2011-03-07: qty 2

## 2011-03-07 MED ORDER — HYDROMORPHONE HCL PF 2 MG/ML IJ SOLN: 4.0000 mg | INTRAMUSCULAR | Status: DC

## 2011-03-07 MED ORDER — HYDROMORPHONE 0.3 MG/ML IV SOLN
INTRAVENOUS | Status: AC
  Administered 2011-03-07: 11:00:00
  Filled 2011-03-07: qty 25

## 2011-03-07 MED ORDER — GUAIFENESIN-DM 100-10 MG/5ML PO SYRP: 5.0000 mL | ORAL_SOLUTION | ORAL | Status: DC | PRN

## 2011-03-07 MED ORDER — DIPHENHYDRAMINE HCL 50 MG/ML IJ SOLN
12.5000 mg | Freq: Four times a day (QID) | INTRAMUSCULAR | Status: DC | PRN
  Administered 2011-03-08: 25 mg via INTRAVENOUS
  Administered 2011-03-08: 50 mg via INTRAVENOUS
  Administered 2011-03-08 – 2011-03-11 (×8): 25 mg via INTRAVENOUS
  Filled 2011-03-07 (×10): qty 1

## 2011-03-07 MED ORDER — LEVOFLOXACIN 500 MG PO TABS
500.0000 mg | ORAL_TABLET | Freq: Every day | ORAL | Status: DC
  Administered 2011-03-07 – 2011-03-11 (×5): 500 mg via ORAL
  Filled 2011-03-07 (×5): qty 1

## 2011-03-07 NOTE — Progress Notes (Signed)
Subjective: The patient was seen on rounds today.  The patient was sitting quietly in his bed watching television.  The patient rates his pain as 7/10 today in his lower back, left flank and bilateral LEs  The patient is also complaining of having a productive cough and chest congestion.  The patient stated that he has been having night sweats lately.  The patient has also had intermittent spells of nausea and dizziness.  No other nursing or patient concerns.   Objective: Vital signs in last 24 hours: Blood pressure 142/95, pulse 78, temperature 98.2 F (36.8 C), temperature source Oral, resp. rate 20, height 6' (1.829 m), weight 184 lb 11.9 oz (83.8 kg), SpO2 100.00%.  General Appearance: Alert, cooperative, moderate distress Head: Normocephalic, without obvious abnormality, atraumatic Eyes: PERRLA, EOMI, the patient had slight strabismus, scleral icterus Nose: Nares normal, septum midline, mucosa normal, no drainage or sinus tenderness. Throat:  Normal lips, mucosa, tongue and gums, dental caries present Back: Symmetric, left-sided CVA tenderness, diffuse tenderness Resp: CTA, Diminished bibasilar breath sounds (LLL > RLL), no wheezes/rales/rhonchi Cardio: S1, S2 regular, no murmurs/clicks/rubs/gallops GI: Soft, non-tender, hypoactive bowel sounds, no masses, no organomegaly Male Genitalia: Deferred, the patient denies priapism Extremities: Extremities normal, atraumatic, no cyanosis or edema, negative Homans sign, no sign of DVT Pulses: 2+ and symmetric Skin: Skin color, texture, turgor normal, no rashes or lesions, tattoos Neurologic: Grossly normal, CN II-XII intact, no focal deficits Psych:  Appropriate affect  Lab Results: Results for orders placed during the hospital encounter of 03/05/11 (from the past 24 hour(s))  CBC     Status: Abnormal   Collection Time   03/07/11  5:35 AM      Component Value Range   WBC 20.5 (*) 4.0 - 10.5 (K/uL)   RBC 2.60 (*) 4.22 - 5.81 (MIL/uL)   Hemoglobin 7.9 (*) 13.0 - 17.0 (g/dL)   HCT 54.0 (*) 98.1 - 52.0 (%)   MCV 90.4  78.0 - 100.0 (fL)   MCH 30.4  26.0 - 34.0 (pg)   MCHC 33.6  30.0 - 36.0 (g/dL)   RDW 19.1 (*) 47.8 - 15.5 (%)   Platelets 532 (*) 150 - 400 (K/uL)  DIFFERENTIAL     Status: Abnormal   Collection Time   03/07/11  5:35 AM      Component Value Range   Neutrophils Relative 54  43 - 77 (%)   Lymphocytes Relative 25  12 - 46 (%)   Monocytes Relative 13 (*) 3 - 12 (%)   Eosinophils Relative 7 (*) 0 - 5 (%)   Basophils Relative 1  0 - 1 (%)   Neutro Abs 11.1 (*) 1.7 - 7.7 (K/uL)   Lymphs Abs 5.1 (*) 0.7 - 4.0 (K/uL)   Monocytes Absolute 2.7 (*) 0.1 - 1.0 (K/uL)   Eosinophils Absolute 1.4 (*) 0.0 - 0.7 (K/uL)   Basophils Absolute 0.2 (*) 0.0 - 0.1 (K/uL)   RBC Morphology RARE NRBCs     WBC Morphology WHITE COUNT CONFIRMED ON SMEAR     Smear Review PLATELET COUNT CONFIRMED BY SMEAR    COMPREHENSIVE METABOLIC PANEL     Status: Abnormal   Collection Time   03/07/11  5:35 AM      Component Value Range   Sodium 136  135 - 145 (mEq/L)   Potassium 4.1  3.5 - 5.1 (mEq/L)   Chloride 108  96 - 112 (mEq/L)   CO2 21  19 - 32 (mEq/L)   Glucose, Bld 92  70 - 99 (mg/dL)   BUN 6  6 - 23 (mg/dL)   Creatinine, Ser 1.61  0.50 - 1.35 (mg/dL)   Calcium 8.7  8.4 - 09.6 (mg/dL)   Total Protein 7.1  6.0 - 8.3 (g/dL)   Albumin 3.6  3.5 - 5.2 (g/dL)   AST 17  0 - 37 (U/L)   ALT 13  0 - 53 (U/L)   Alkaline Phosphatase 87  39 - 117 (U/L)   Total Bilirubin 2.9 (*) 0.3 - 1.2 (mg/dL)   GFR calc non Af Amer >90  >90 (mL/min)   GFR calc Af Amer >90  >90 (mL/min)    Studies/Results: No results found.  Medications: I have reviewed the patient's current medications.  Allergies  Allergen Reactions  . Morphine And Related Rash    Current Facility-Administered Medications  Medication Dose Route Frequency Provider Last Rate Last Dose  . 0.45 % NaCl with KCl 20 mEq / L infusion   Intravenous Continuous [name], MD 75 mL/hr at  03/07/11 1145    . diphenhydrAMINE (BENADRYL) injection 12.5-25 mg  12.5-25 mg Intravenous Q6H PRN Larina Bras, NP       Or  . diphenhydrAMINE (BENADRYL) 12.5 MG/5ML elixir 12.5-25 mg  12.5-25 mg Oral Q6H PRN Larina Bras, NP      . enoxaparin (LOVENOX) injection 40 mg  40 mg Subcutaneous Q24H [name], MD   40 mg at 03/06/11 1822  . Flora-Q (FLORA-Q) Capsule 1 capsule  1 capsule Oral Daily Willey Blade, MD   1 capsule at 03/07/11 1025  . folic acid (FOLVITE) tablet 1 mg  1 mg Oral Daily [name], MD   1 mg at 03/07/11 1026  . guaiFENesin-dextromethorphan (ROBITUSSIN DM) 100-10 MG/5ML syrup 5 mL  5 mL Oral Q4H PRN Larina Bras, NP      . HYDROmorphone (DILAUDID) injection 4 mg  2-4 mg Intravenous Q2H Larina Bras, NP      . HYDROmorphone PCA 0.3 mg/mL (DILAUDID) 0.3 mg/mL infusion           . hydroxyurea (HYDREA) capsule 500 mg  500 mg Oral BID [name], MD   500 mg at 03/07/11 1026  . ibuprofen (ADVIL,MOTRIN) tablet 800 mg  800 mg Oral Q8H PRN [name], MD      . ketorolac (TORADOL) 15 MG/ML injection 15 mg  15 mg Intravenous Q6H Christina Rama, MD   15 mg at 03/07/11 1148  . levofloxacin (LEVAQUIN) tablet 500 mg  500 mg Oral Daily Larina Bras, NP      . naloxone [facility]) injection 0.4 mg  0.4 mg Intravenous PRN Willey Blade, MD       And  . sodium chloride 0.9 % injection 9 mL  9 mL Intravenous PRN Willey Blade, MD      . OLANZapine [facility]) tablet 10 mg  10 mg Oral QHS [name], MD   10 mg at 03/06/11 2206  . ondansetron (ZOFRAN) tablet 4 mg  4 mg Oral Q4H PRN [name], MD       Or  . ondansetron (ZOFRAN) injection 4 mg  4 mg Intravenous Q4H PRN [name], MD   4 mg at 03/06/11 1035  . oxyCODONE (OXYCONTIN) 12 hr tablet 40 mg  40 mg Oral Q12H Christina Rama, MD   40 mg at 03/07/11 1025  . pantoprazole (PROTONIX) EC tablet 40 mg  40 mg Oral Q0600 Larina Bras, NP      . polyethylene glycol (MIRALAX / GLYCOLAX)  packet 17 g  17 g  Oral Daily PRN [name], MD      . potassium chloride 10 mEq in 50 mL *CENTRAL LINE* IVPB  10 mEq Intravenous Once Willey Blade, MD   10 mEq at 03/06/11 1621  . potassium chloride SA (K-DUR,KLOR-CON) CR tablet 20 mEq  20 mEq Oral TID [name], MD   20 mEq at 03/07/11 1026  . sodium chloride 0.9 % bolus 1,000 mL  1,000 mL Intravenous Once Glynn Octave, MD      . sodium chloride 0.9 % injection 10-40 mL  10-40 mL Intracatheter PRN [name], MD      . zolpidem (AMBIEN) tablet 10 mg  10 mg Oral QHS PRN [name], MD   10 mg at 03/06/11 2206    Assessment/Plan: Patient Active Problem List  Diagnoses  . Sickle cell pain crisis  . Leukocytosis  . Chronic pain  . Hypokalemia  . Thrombocytosis  . Diarrhea    Sickle Cell Crisis/Chronic Pain:  The patient will have a blood exchange today.  The patient is in active hemolysis.   The patient will continue pain management with scheduled pain medication,  IV hydration, home medications, GI and DVT prophylaxis. CMP, Mg, Phos, Ferritin, ProBNP, hemoglobinopathy,  TSH, Free T4 and CBC in the am.   Leukocytosis/Thrombocytocis/Productive Cough:  The patient will be started on PO antibiotics today and has Robitussin DM available PRN Hypokalemia:  Potassium level is WNL - will continue to monitor Diarrhea:  No episode of diarrhea since arrival - will continue to monitor  Larina Bras, NP-C 03/07/2011, 3:00 PM

## 2011-03-08 DIAGNOSIS — D473 Essential (hemorrhagic) thrombocythemia: Secondary | ICD-10-CM | POA: Diagnosis not present

## 2011-03-08 DIAGNOSIS — F39 Unspecified mood [affective] disorder: Secondary | ICD-10-CM | POA: Diagnosis not present

## 2011-03-08 DIAGNOSIS — D5700 Hb-SS disease with crisis, unspecified: Secondary | ICD-10-CM | POA: Diagnosis not present

## 2011-03-08 DIAGNOSIS — D72829 Elevated white blood cell count, unspecified: Secondary | ICD-10-CM | POA: Diagnosis not present

## 2011-03-08 DIAGNOSIS — M87029 Idiopathic aseptic necrosis of unspecified humerus: Secondary | ICD-10-CM | POA: Diagnosis not present

## 2011-03-08 DIAGNOSIS — E876 Hypokalemia: Secondary | ICD-10-CM | POA: Diagnosis not present

## 2011-03-08 LAB — CBC
HCT: 24.6 % — ABNORMAL LOW (ref 39.0–52.0)
MCH: 31.3 pg (ref 26.0–34.0)
MCHC: 35 g/dL (ref 30.0–36.0)
MCV: 89.5 fL (ref 78.0–100.0)
RDW: 17.6 % — ABNORMAL HIGH (ref 11.5–15.5)

## 2011-03-08 LAB — TYPE AND SCREEN: ABO/RH(D): O POS

## 2011-03-08 LAB — MAGNESIUM: Magnesium: 1.7 mg/dL (ref 1.5–2.5)

## 2011-03-08 LAB — DIFFERENTIAL
Basophils Absolute: 0.3 10*3/uL — ABNORMAL HIGH (ref 0.0–0.1)
Basophils Relative: 1 % (ref 0–1)
Eosinophils Relative: 8 % — ABNORMAL HIGH (ref 0–5)
Monocytes Absolute: 3.2 10*3/uL — ABNORMAL HIGH (ref 0.1–1.0)

## 2011-03-08 LAB — COMPREHENSIVE METABOLIC PANEL
AST: 23 U/L (ref 0–37)
Albumin: 3.7 g/dL (ref 3.5–5.2)
BUN: 7 mg/dL (ref 6–23)
Calcium: 8.8 mg/dL (ref 8.4–10.5)
Creatinine, Ser: 0.53 mg/dL (ref 0.50–1.35)
GFR calc non Af Amer: >90 mL/min (ref >90)

## 2011-03-08 LAB — T4, FREE: Free T4: 1.51 ng/dL (ref 0.80–1.80)

## 2011-03-08 MED ORDER — DEFEROXAMINE MESYLATE 2 G IJ SOLR
2000.0000 mg | Freq: Every day | INTRAMUSCULAR | Status: AC
Start: 2011-03-08 — End: 2011-03-09
  Administered 2011-03-08: 2000 mg via INTRAVENOUS
  Filled 2011-03-08: qty 2

## 2011-03-08 MED ORDER — FUROSEMIDE 10 MG/ML IJ SOLN
20.0000 mg | Freq: Once | INTRAMUSCULAR | Status: AC
  Administered 2011-03-08: 20 mg via INTRAVENOUS
  Filled 2011-03-08: qty 2

## 2011-03-08 MED ORDER — ACETAMINOPHEN 325 MG PO TABS: 650.0000 mg | ORAL_TABLET | ORAL | Status: DC | PRN

## 2011-03-08 NOTE — Progress Notes (Signed)
Clinical Social Worker completed brief psychosocial assessment.  Assessment in [facility] chart.  Eris Hannan J Jese Comella MSW, LCSW [phone]  

## 2011-03-08 NOTE — Progress Notes (Signed)
Subjective: The patient was seen on rounds today.  The patient was sitting quietly in his bed watching television.  The patient rates his pain as 6/10 today in his  left flank and bilateral LEs.  The patient states his lower back pain is better today.   The patient also has a productive cough.  The patient is encouraged to use the IS often and is reaching the 1500 level on the device.  The patient stated that he has been having night sweats lately but they are much worse when he is at home.  Some of the patient's lab results are pending, so I was not able to fully review all of his lab values with him today.  No other nursing or patient concerns.   Objective: Vital signs in last 24 hours: Blood pressure 143/92, pulse 68, temperature 98.5 F (36.9 C), temperature source Oral, resp. rate 20, height 6' (1.829 m), weight 184 lb 11.9 oz (83.8 kg), SpO2 96.00%.  General Appearance: Alert, cooperative, mild distress Head: Normocephalic, without obvious abnormality, atraumatic Eyes: PERRLA, EOMI, the patient had slight strabismus, scleral icterus Nose: Nares normal, septum midline, mucosa normal, no drainage or sinus tenderness. Throat:  Normal lips, mucosa, tongue and gums Back: Symmetric, left-sided CVA tenderness, mild diffuse tenderness Resp: CTA, Diminished bibasilar breath sounds, no wheezes/rales/rhonchi Cardio: S1, S2 regular, no murmurs/clicks/rubs/gallops GI: Soft, non-tender, hypoactive bowel sounds, no masses, no organomegaly Male Genitalia: Deferred, the patient denies priapism Extremities: Extremities normal, atraumatic, no cyanosis or edema, negative Homans sign, no sign of DVT Pulses: 2+ and symmetric Skin: Skin color, texture, turgor normal, no rashes or lesions, numerous tattoos Neurologic: Grossly normal, CN II-XII intact, no focal deficits Psych:  Appropriate affect  Lab Results: Results for orders placed during the hospital encounter of 03/05/11 (from the past 24 hour(s))    PREPARE RBC (CROSSMATCH)     Status: Normal   Collection Time   03/07/11  3:30 PM      Component Value Range   Order Confirmation ORDER PROCESSED BY BLOOD BANK    TYPE AND SCREEN     Status: Normal   Collection Time   03/07/11  4:20 PM      Component Value Range   ABO/RH(D) O POS     Antibody Screen NEG     Sample Expiration 03/10/2011     Unit Number [id number]     Blood Component Type RED CELLS,LR     Unit division 00     Status of Unit ISSUED,FINAL     Donor AG Type       Value: NEGATIVE FOR C ANTIGEN NEGATIVE FOR E ANTIGEN NEGATIVE FOR KELL ANTIGEN   Transfusion Status OK TO TRANSFUSE     Crossmatch Result COMPATIBLE    MAGNESIUM     Status: Normal   Collection Time   03/08/11  5:00 AM      Component Value Range   Magnesium 1.7  1.5 - 2.5 (mg/dL)  PHOSPHORUS     Status: Normal   Collection Time   03/08/11  5:00 AM      Component Value Range   Phosphorus 4.1  2.3 - 4.6 (mg/dL)  PRO B NATRIURETIC PEPTIDE     Status: Abnormal   Collection Time   03/08/11  5:00 AM      Component Value Range   Pro B Natriuretic peptide (BNP) 190.2 (*) 0 - 125 (pg/mL)  CBC     Status: Abnormal   Collection Time   03/08/11  5:00 AM      Component Value Range   WBC 20.4 (*) 4.0 - 10.5 (K/uL)   RBC 2.75 (*) 4.22 - 5.81 (MIL/uL)   Hemoglobin 8.6 (*) 13.0 - 17.0 (g/dL)   HCT 16.1 (*) 09.6 - 52.0 (%)   MCV 89.5  78.0 - 100.0 (fL)   MCH 31.3  26.0 - 34.0 (pg)   MCHC 35.0  30.0 - 36.0 (g/dL)   RDW 04.5 (*) 40.9 - 15.5 (%)   Platelets 483 (*) 150 - 400 (K/uL)  DIFFERENTIAL     Status: Abnormal   Collection Time   03/08/11  5:00 AM      Component Value Range   Neutrophils Relative 59  43 - 77 (%)   Neutro Abs 12.0 (*) 1.7 - 7.7 (K/uL)   Lymphocytes Relative 17  12 - 46 (%)   Lymphs Abs 3.4  0.7 - 4.0 (K/uL)   Monocytes Relative 15 (*) 3 - 12 (%)   Monocytes Absolute 3.2 (*) 0.1 - 1.0 (K/uL)   Eosinophils Relative 8 (*) 0 - 5 (%)   Eosinophils Absolute 1.5 (*) 0.0 - 0.7 (K/uL)   Basophils Relative 1   0 - 1 (%)   Basophils Absolute 0.3 (*) 0.0 - 0.1 (K/uL)  COMPREHENSIVE METABOLIC PANEL     Status: Abnormal   Collection Time   03/08/11  5:00 AM      Component Value Range   Sodium 137  135 - 145 (mEq/L)   Potassium 3.9  3.5 - 5.1 (mEq/L)   Chloride 107  96 - 112 (mEq/L)   CO2 20  19 - 32 (mEq/L)   Glucose, Bld 112 (*) 70 - 99 (mg/dL)   BUN 7  6 - 23 (mg/dL)   Creatinine, Ser 8.11  0.50 - 1.35 (mg/dL)   Calcium 8.8  8.4 - 91.4 (mg/dL)   Total Protein 7.2  6.0 - 8.3 (g/dL)   Albumin 3.7  3.5 - 5.2 (g/dL)   AST 23  0 - 37 (U/L)   ALT 15  0 - 53 (U/L)   Alkaline Phosphatase 98  39 - 117 (U/L)   Total Bilirubin 3.0 (*) 0.3 - 1.2 (mg/dL)   GFR calc non Af Amer >90  >90 (mL/min)   GFR calc Af Amer >90  >90 (mL/min)    Studies/Results: No results found.  Medications: I have reviewed the patient's current medications.  Allergies  Allergen Reactions  . Morphine And Related Rash     Current Facility-Administered Medications  Medication Dose Route Frequency Provider Last Rate Last Dose  . 0.45 % NaCl with KCl 20 mEq / L infusion   Intravenous Continuous Larina Bras, NP 50 mL/hr at 03/08/11 1030 50 mL at 03/08/11 1030  . acetaminophen (TYLENOL) tablet 650 mg  650 mg Oral Q4H PRN Larina Bras, NP      . deferoxamine (DESFERAL) 2,000 mg in dextrose 5 % 500 mL infusion  2,000 mg Intravenous QHS Larina Bras, NP      . diphenhydrAMINE (BENADRYL) injection 12.5-25 mg  12.5-25 mg Intravenous Q6H PRN Larina Bras, NP   25 mg at 03/08/11 1035   Or  . diphenhydrAMINE (BENADRYL) 12.5 MG/5ML elixir 12.5-25 mg  12.5-25 mg Oral Q6H PRN Larina Bras, NP      . enoxaparin (LOVENOX) injection 40 mg  40 mg Subcutaneous Q24H [name], MD   40 mg at 03/07/11 1837  . Flora-Q (FLORA-Q) Capsule 1 capsule  1 capsule Oral Daily Willey Blade,  MD   1 capsule at 03/08/11 1019  . folic acid (FOLVITE) tablet 1 mg  1 mg Oral Daily Christina Rama, MD   1 mg at 03/08/11 1018  .  furosemide (LASIX) injection 20 mg  20 mg Intravenous Once Larina Bras, NP   20 mg at 03/08/11 1108  . guaiFENesin-dextromethorphan (ROBITUSSIN DM) 100-10 MG/5ML syrup 5 mL  5 mL Oral Q4H PRN Larina Bras, NP      . HYDROmorphone (DILAUDID) injection 2-4 mg  2-4 mg Intravenous Q2H Larina Bras, NP   4 mg at 03/08/11 1614  . hydroxyurea (HYDREA) capsule 500 mg  500 mg Oral BID [name], MD   500 mg at 03/08/11 1019  . ibuprofen (ADVIL,MOTRIN) tablet 800 mg  800 mg Oral Q8H PRN [name], MD      . ketorolac (TORADOL) 15 MG/ML injection 15 mg  15 mg Intravenous Q6H [name], MD   15 mg at 03/08/11 1203  . levofloxacin (LEVAQUIN) tablet 500 mg  500 mg Oral Daily Larina Bras, NP   500 mg at 03/08/11 1019  . naloxone [facility]) injection 0.4 mg  0.4 mg Intravenous PRN Willey Blade, MD       And  . sodium chloride 0.9 % injection 9 mL  9 mL Intravenous PRN Willey Blade, MD      . OLANZapine (ZYPREXA) tablet 10 mg  10 mg Oral QHS [name], MD   10 mg at 03/07/11 2223  . ondansetron (ZOFRAN) tablet 4 mg  4 mg Oral Q4H PRN [name], MD       Or  . ondansetron (ZOFRAN) injection 4 mg  4 mg Intravenous Q4H PRN [name], MD   4 mg at 03/06/11 1035  . oxyCODONE (OXYCONTIN) 12 hr tablet 40 mg  40 mg Oral Q12H Christina Rama, MD   40 mg at 03/08/11 1019  . pantoprazole (PROTONIX) EC tablet 40 mg  40 mg Oral Q0600 Larina Bras, NP   40 mg at 03/08/11 0600  . polyethylene glycol (MIRALAX / GLYCOLAX) packet 17 g  17 g Oral Daily PRN [name], MD      . potassium chloride SA (K-DUR,KLOR-CON) CR tablet 20 mEq  20 mEq Oral TID [name], MD   20 mEq at 03/08/11 1615  . sodium chloride 0.9 % bolus 1,000 mL  1,000 mL Intravenous Once Glynn Octave, MD      . sodium chloride 0.9 % injection 10-40 mL  10-40 mL Intracatheter PRN [name], MD   10 mL at 03/08/11 1018  . zolpidem (AMBIEN) tablet 10 mg  10 mg Oral QHS PRN [name], MD   10 mg at  03/07/11 2223    Assessment/Plan: Patient Active Problem List  Diagnoses  . Sickle cell pain crisis  . Leukocytosis  . Chronic pain  . Hypokalemia  . Thrombocytosis  . Diarrhea  . Elevated BNP  . Hemochromatosis    Sickle Cell Crisis/Chronic Pain:  The patient continues to be in active hemolysis.   The patient will continue pain/nausea/pruritis management, IV hydration, home medications, GI and DVT prophylaxis. CMP and CBC in the am.  TSH and Free T4 are WNL.   Ferritin and hemoglobinopathy are pending.    Leukocytosis/Thrombocytocis/Productive Cough:  The patient is on PO antibiotics and has Robitussin DM available PRN, IS usage is encouraged Hypokalemia:  Potassium level is WNL - will continue to monitor Diarrhea:  No episode of diarrhea since arrival - will continue to monitor Elevated  BNP:  The patient will receive a dose of IV Lasix today - recheck ProBNP in am Hemochramtosis:  The patient will receive a Desferal infusion tonight - will continue to monitor  Larina Bras, NP-C 03/08/2011, 10:30 AM

## 2011-03-09 DIAGNOSIS — D72829 Elevated white blood cell count, unspecified: Secondary | ICD-10-CM | POA: Diagnosis not present

## 2011-03-09 DIAGNOSIS — E876 Hypokalemia: Secondary | ICD-10-CM | POA: Diagnosis not present

## 2011-03-09 DIAGNOSIS — D5700 Hb-SS disease with crisis, unspecified: Secondary | ICD-10-CM | POA: Diagnosis not present

## 2011-03-09 DIAGNOSIS — M87029 Idiopathic aseptic necrosis of unspecified humerus: Secondary | ICD-10-CM | POA: Diagnosis not present

## 2011-03-09 DIAGNOSIS — F39 Unspecified mood [affective] disorder: Secondary | ICD-10-CM | POA: Diagnosis not present

## 2011-03-09 DIAGNOSIS — D473 Essential (hemorrhagic) thrombocythemia: Secondary | ICD-10-CM | POA: Diagnosis not present

## 2011-03-09 LAB — CBC
Hemoglobin: 8.7 g/dL — ABNORMAL LOW (ref 13.0–17.0)
MCH: 30.9 pg (ref 26.0–34.0)
MCV: 89.4 fL (ref 78.0–100.0)
RBC: 2.82 MIL/uL — ABNORMAL LOW (ref 4.22–5.81)
WBC: 19.7 10*3/uL — ABNORMAL HIGH (ref 4.0–10.5)

## 2011-03-09 LAB — FERRITIN: Ferritin: 1558 ng/mL — ABNORMAL HIGH (ref 22–322)

## 2011-03-09 IMAGING — CR DG CHEST 2V
2 series · 2 of 2 positions shown · non-contrast
Comparison: 04/01/2008

CLINICAL DATA: Sickle cell crisis.

CHEST - 2 VIEW

[w chest pa]
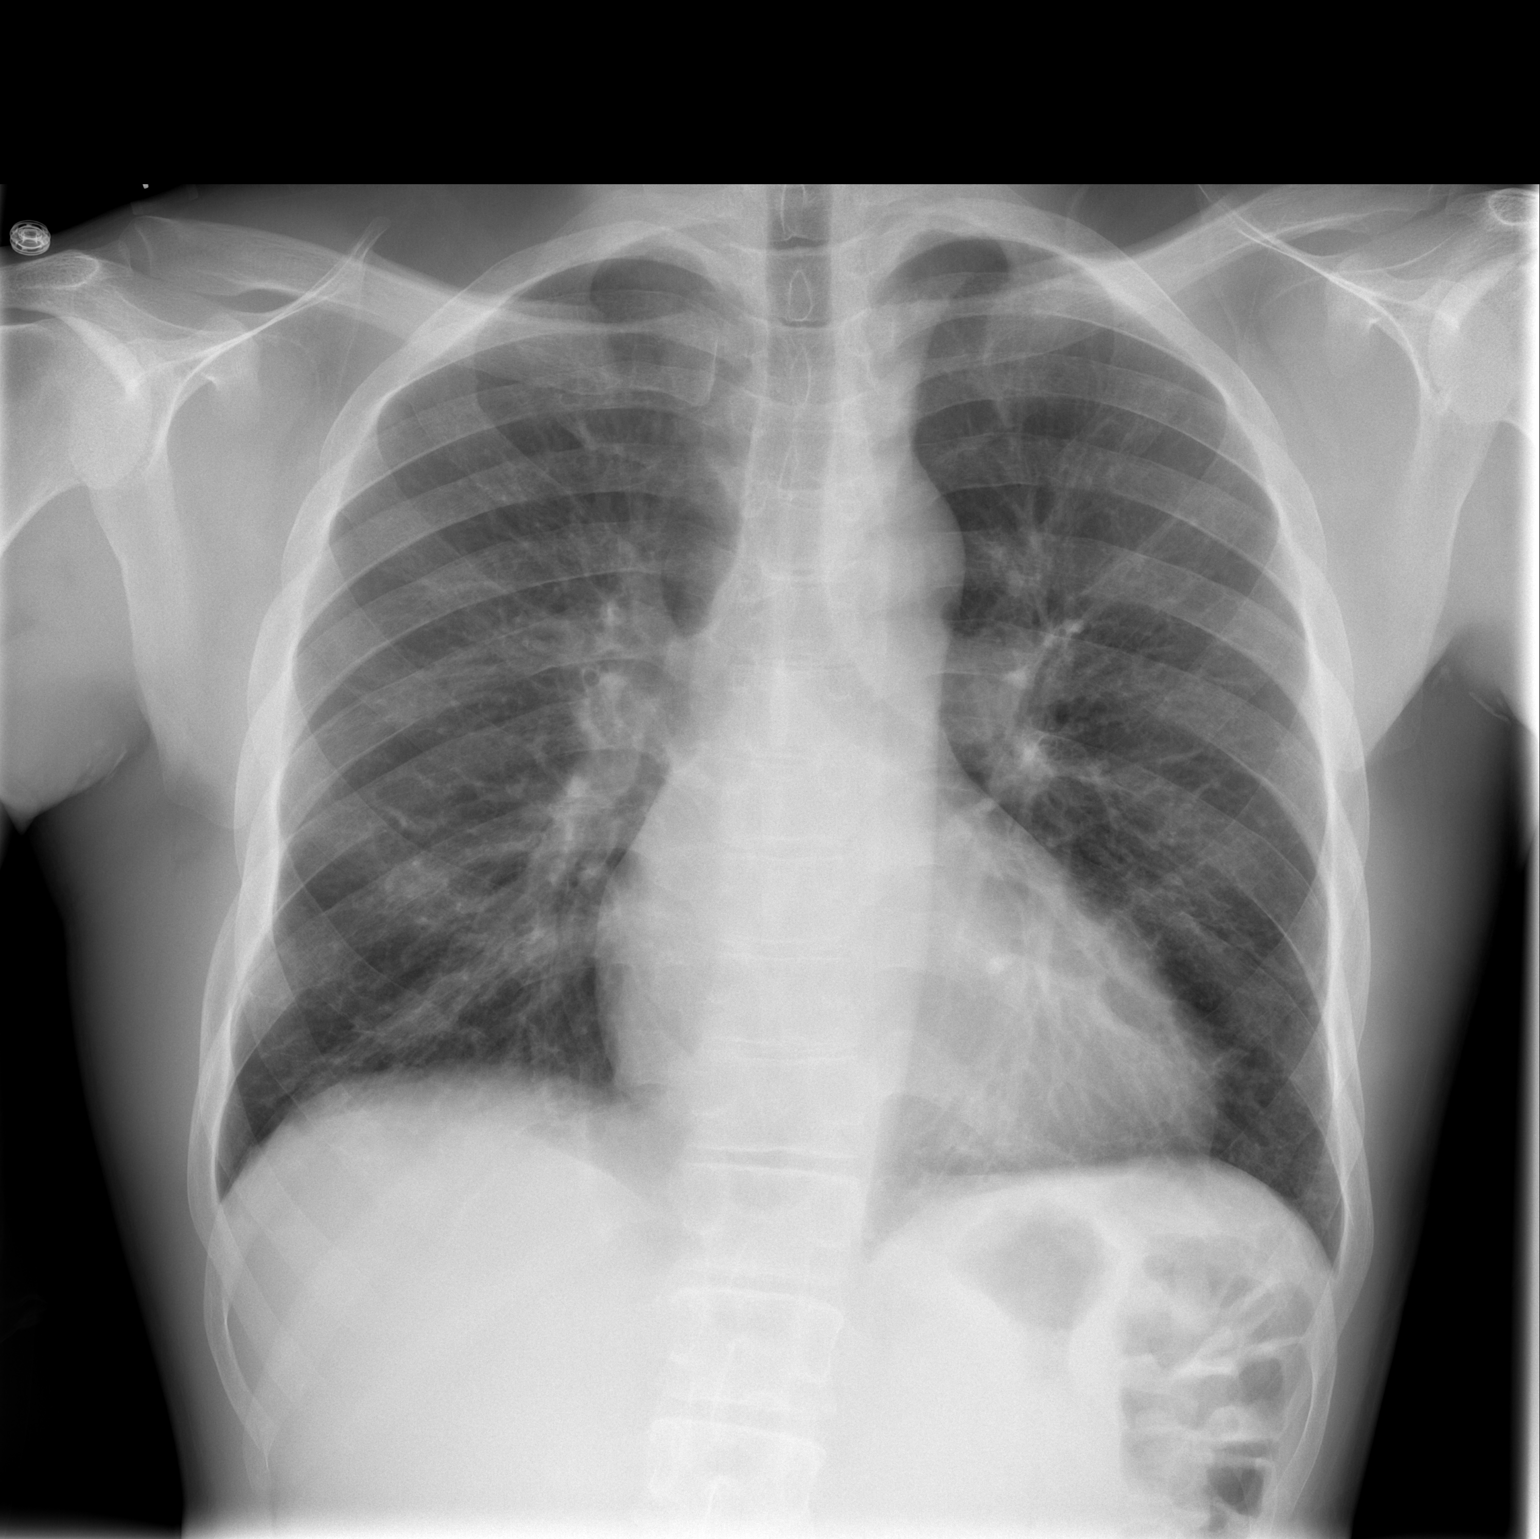

[w chest lat]
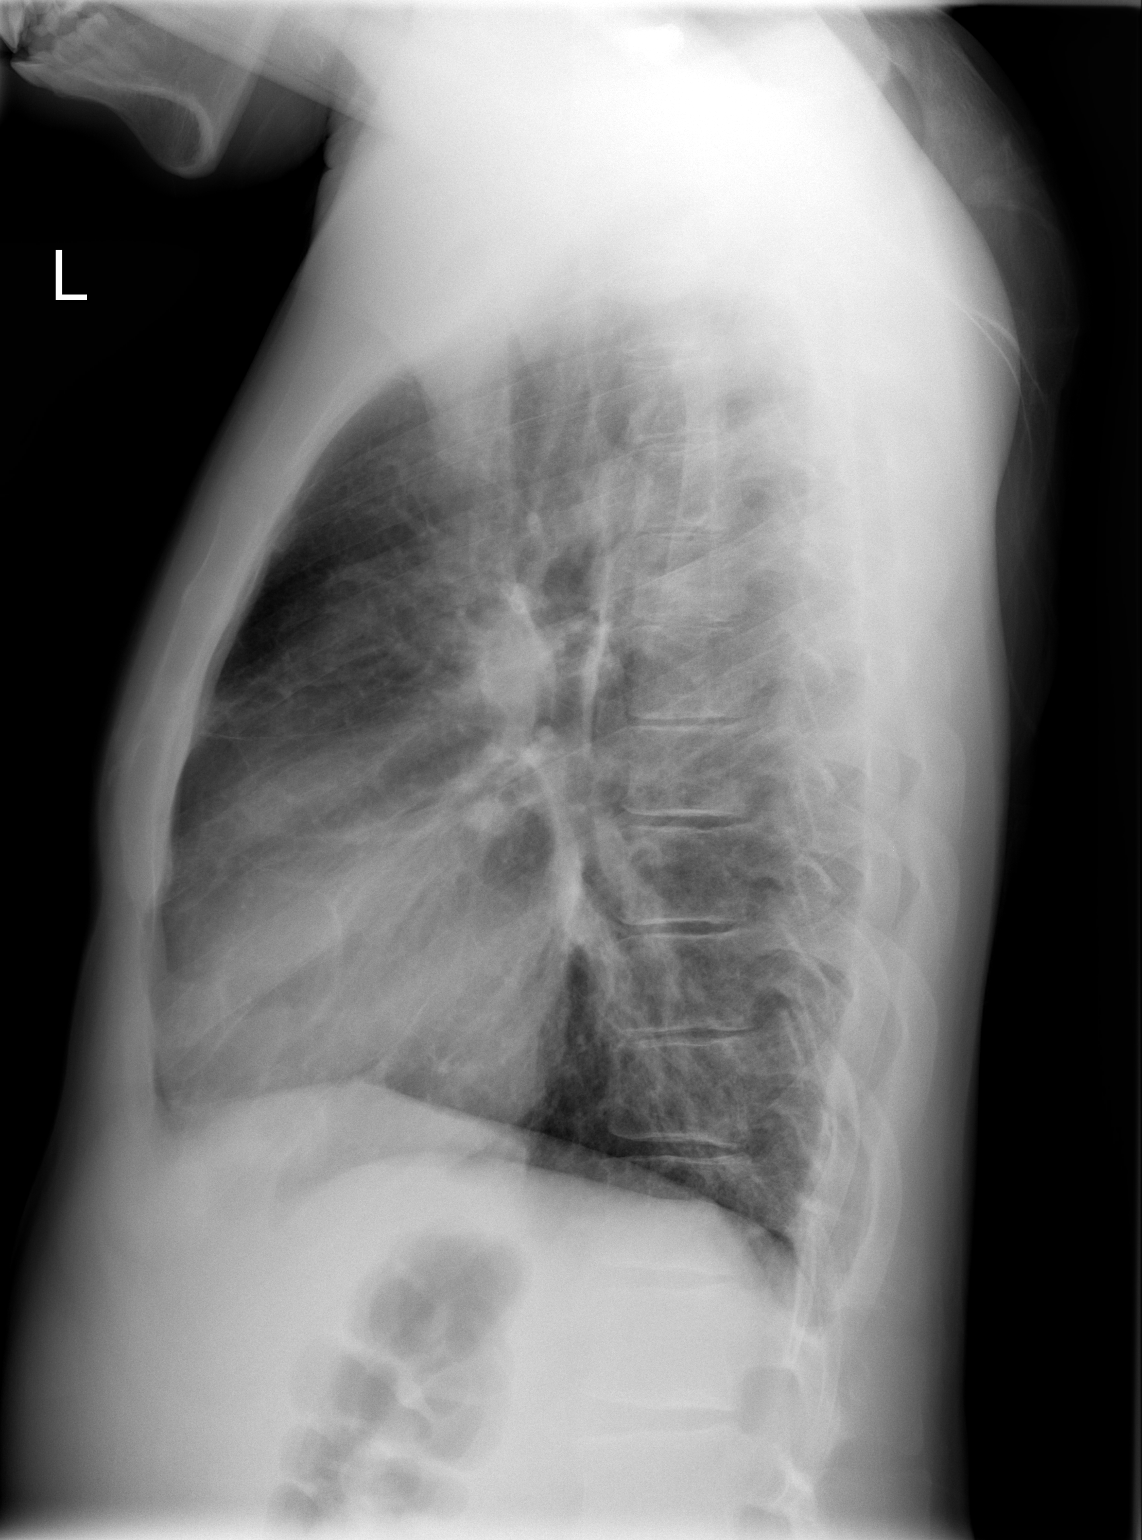

[2 of 2 positions shown; findings below may reference images not displayed]

FINDINGS: The cardiac silhouette, mediastinal and hilar contours
are stable.  Mild chronic bronchitic changes.  No acute pulmonary
findings.
IMPRESSION: No acute cardiopulmonary findings.

## 2011-03-09 NOTE — Consult Note (Addendum)
ORTHOPAEDIC CONSULTATION  REQUESTING PHYSICIAN: Willey Blade, MD  Chief Complaint: Low back pain and left shoulder pain  HPI: [name] is a 31 y.o. male who complains of  moderate low back pain that has been present for about a month. He denies any radicular symptoms. He has not had any treatment for it. Rest makes it better, along with pain medications. He localizes the pain at the low back, and has mild paraspinal pain as well.  He also complains of left shoulder pain with difficulty with overhead motion. He says he gets injections every year or so, and has not had one in a year and would like another injection. Pain is rated as moderate in that location over the left shoulder. Activity makes it worse. Injections make it better. He denies radiating symptoms down his arm.  He was admitted and sickle cell crisis and has pain around his left rib cage as well. He denies any recent infections.  Past Medical History  Diagnosis Date  . Sickle cell anemia   . Blood transfusion   . 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 Hypokalemia   . Mood disorder   . Pulmonary embolism   . Avascular necrosis   . Leukocytosis     Chronic  . Thrombocytosis     Chronic   Past Surgical History  Procedure Date  . Right hip replacement     08/2006  . Cholecystectomy     01/2008  . Porta cath placement   . Porta cath removal   . Umbilical hernia repair     01/2008  . Excision of left periauricular cyst     10/2009  . Excision of right ear lobe cyst with primary closur     11/2007   History   Social History  . Marital Status: Single    Spouse Name: N/A    Number of Children: 0  . Years of Education: 13   Occupational History  . Unemployed    Social History Main Topics  . Smoking status: Former Smoker -- 13 years    Types: Cigarettes    Quit date: 07/08/2010  . Smokeless tobacco: Never Used  . Alcohol Use: No  . Drug Use: No     quit smoking 2011  .  Sexually Active: Yes    Birth Control/ Protection: None   Other Topics Concern  . None   Social History Narrative   Single.  Lives with girlfriend.     Family History  Problem Relation Age of Onset  . Sickle cell anemia Mother   . Sickle cell anemia Father   . Sickle cell trait Brother    Allergies  Allergen Reactions  . Morphine And Related Rash   Prior to Admission medications   Medication Sig Start Date End Date Taking? Authorizing Provider  folic acid (FOLVITE) 1 MG tablet Take 1 mg by mouth daily.   Yes Willey Blade, MD  HYDROmorphone (DILAUDID) 4 MG tablet Take 4 mg by mouth every 4 (four) hours as needed. For pain   Yes Larina Bras, NP  hydroxyurea (HYDREA) 500 MG capsule Take 500 mg by mouth 2 (two) times daily. May take with food to minimize GI side effects.   Yes Larina Bras, NP  ibuprofen (ADVIL,MOTRIN) 800 MG tablet Take 800 mg by mouth every 8 (eight) hours as needed. For pain.   Yes Historical Provider, MD  levofloxacin (LEVAQUIN) 750 MG tablet Take 750 mg by mouth  daily. He took for 10 days. He started on 02/21/11 and completed on 03/02/11.   Yes Historical Provider, MD  OLANZapine (ZYPREXA) 10 MG tablet Take 10 mg by mouth at bedtime.   Yes Larina Bras, NP  oxyCODONE (OXYCONTIN) 40 MG 12 hr tablet Take 40 mg by mouth every 12 (twelve) hours as needed. For pain.   Yes Larina Bras, NP  potassium chloride SA (K-DUR,KLOR-CON) 20 MEQ tablet Take 20 mEq by mouth daily.    Yes Historical Provider, MD  zolpidem (AMBIEN) 10 MG tablet Take 10 mg by mouth at bedtime as needed. For sleep.   Yes Larina Bras, NP   No results found.  Positive ROS: All other systems have been reviewed and were otherwise negative with the exception of those mentioned in the HPI and as above.  Physical Exam: General: Alert, no acute distress Cardiovascular: No pedal edema Respiratory: No cyanosis, no use of accessory musculature GI: Abdomen is soft and nontender. Skin:  No lesions in the area of chief complaint Neurologic: Sensation intact distally Psychiatric: Patient is competent for consent with normal mood and affect Lymphatic: No axillary or cervical lymphadenopathy  MUSCULOSKELETAL: His low back he is minimally tender to palpation. He is sitting comfortably in bed. EHL and FHL are firing bilaterally and he has no neurologic deficits in his lower extremities I can appreciate.  His left shoulder has active forward flexion 0-90. External rotation is to 20. He has no significant pain to palpation, but has moderate impingement signs above 90. His rotator cuff strength seems to be intact.  He has indicated to me that he is at x-rays in the past that demonstrated avascular necrosis of the left shoulder, and he is treated periodically with injections for this.  Assessment: Mechanical left low back pain, and left shoulder avascular necrosis, sickle cell crisis  Plan: His back is an acute problem, which hopefully will respond to conservative measures including physical therapy. I would avoid any type of Medrol Dosepak or steroids, given his sickle cell disease and proclivity to avascular necrosis. Therefore I am ordering physical therapy for his back, and he can also take over-the-counter anti-inflammatories as tolerated given his other coexisting comorbidities, which I will leave to Dr. [name] to manage.  As far as his shoulder goes, we will plan to inject his shoulder later today. He can continue with conservative care for his shoulder as well. I am happy to see him as an outpatient in the next 2-3 weeks, or as needed.  Thank you for this consultation.  Addendum: After I had left, he asked [name], orthopedic PA-C, my assistant regarding the possibility of an evaluation of his hip replacement. He apparently has a metal-on-metal hip replacement, and is not sure anything done about it. I would recommend an outpatient referral to Dr. Iran Planas, who manages  our revision hip arthroplasty. When he follows up with me in the office I can arrange for this referral, or we can do it earlier if necessary. [name], MD 03/09/2011 1:04 PM

## 2011-03-09 NOTE — Procedures (Signed)
Aspiration/Injection Procedure Note [name] [id number] Dec 10, 1980  Procedure: Injection Indications: Left shoulder Pain  Procedure Details Consent: Risks of procedure as well as the alternatives and risks of each were explained to the (patient/caregiver).  Consent for procedure obtained. Time Out: Verified patient identification, verified procedure, site/side was marked, verified correct patient position, special equipment/implants available, medications/allergies/relevent history reviewed, required imaging and test results available.  Performed   Local Anesthesia Used:Marcaine 0.5% plain; 4mL along with 1 mL of Depomedrol. Injected into glenohumeral joint.  Patient did tolerate procedure well. Estimated blood loss: none  DOUGLAS PARRY, BRANDON 03/09/2011, 2:40 PM

## 2011-03-09 NOTE — Progress Notes (Signed)
Subjective:  Patient reports that he's feeling better in general. His overall pain is decreased to 5-6/10. He denies any new chest pains or shortness of breath. His shoulder pain is improved as well. He is ambulating in his room. No other new complaints.   Allergies  Allergen Reactions  . Morphine And Related Rash   Current Facility-Administered Medications  Medication Dose Route Frequency Provider Last Rate Last Dose  . 0.45 % NaCl with KCl 20 mEq / L infusion   Intravenous Continuous Larina Bras, NP 50 mL/hr at 03/09/11 1843    . acetaminophen (TYLENOL) tablet 650 mg  650 mg Oral Q4H PRN Larina Bras, NP      . deferoxamine (DESFERAL) 2,000 mg in dextrose 5 % 500 mL infusion  2,000 mg Intravenous QHS Larina Bras, NP 125 mL/hr at 03/08/11 2313 2,000 mg at 03/08/11 2313  . diphenhydrAMINE (BENADRYL) injection 12.5-25 mg  12.5-25 mg Intravenous Q6H PRN Larina Bras, NP   25 mg at 03/09/11 2030   Or  . diphenhydrAMINE (BENADRYL) 12.5 MG/5ML elixir 12.5-25 mg  12.5-25 mg Oral Q6H PRN Larina Bras, NP      . enoxaparin (LOVENOX) injection 40 mg  40 mg Subcutaneous Q24H [name], MD   40 mg at 03/09/11 1811  . Flora-Q (FLORA-Q) Capsule 1 capsule  1 capsule Oral Daily Willey Blade, MD   1 capsule at 03/09/11 0946  . folic acid (FOLVITE) tablet 1 mg  1 mg Oral Daily [name], MD   1 mg at 03/09/11 0946  . guaiFENesin-dextromethorphan (ROBITUSSIN DM) 100-10 MG/5ML syrup 5 mL  5 mL Oral Q4H PRN Larina Bras, NP      . HYDROmorphone (DILAUDID) injection 2-4 mg  2-4 mg Intravenous Q2H Larina Bras, NP   2 mg at 03/09/11 2030  . hydroxyurea (HYDREA) capsule 500 mg  500 mg Oral BID [name], MD   500 mg at 03/09/11 0949  . ibuprofen (ADVIL,MOTRIN) tablet 800 mg  800 mg Oral Q8H PRN [name], MD      . ketorolac (TORADOL) 15 MG/ML injection 15 mg  15 mg Intravenous Q6H [name], MD   15 mg at 03/09/11 1812  . levofloxacin (LEVAQUIN) tablet  500 mg  500 mg Oral Daily Larina Bras, NP   500 mg at 03/09/11 0946  . naloxone [facility]) injection 0.4 mg  0.4 mg Intravenous PRN Willey Blade, MD       And  . sodium chloride 0.9 % injection 9 mL  9 mL Intravenous PRN Willey Blade, MD      . OLANZapine (ZYPREXA) tablet 10 mg  10 mg Oral QHS [name], MD   10 mg at 03/08/11 2230  . ondansetron (ZOFRAN) tablet 4 mg  4 mg Oral Q4H PRN [name], MD       Or  . ondansetron (ZOFRAN) injection 4 mg  4 mg Intravenous Q4H PRN [name], MD   4 mg at 03/06/11 1035  . oxyCODONE (OXYCONTIN) 12 hr tablet 40 mg  40 mg Oral Q12H [name], MD   40 mg at 03/09/11 0946  . pantoprazole (PROTONIX) EC tablet 40 mg  40 mg Oral Q0600 Larina Bras, NP   40 mg at 03/09/11 0605  . polyethylene glycol (MIRALAX / GLYCOLAX) packet 17 g  17 g Oral Daily PRN [name], MD      . potassium chloride SA (K-DUR,KLOR-CON) CR tablet 20 mEq  20 mEq Oral TID [name], MD   20 mEq  at 03/09/11 1607  . sodium chloride 0.9 % bolus 1,000 mL  1,000 mL Intravenous Once Glynn Octave, MD      . sodium chloride 0.9 % injection 10-40 mL  10-40 mL Intracatheter PRN [name], MD   10 mL at 03/08/11 1018  . zolpidem (AMBIEN) tablet 10 mg  10 mg Oral QHS PRN [name], MD   10 mg at 03/08/11 2313    Objective: Blood pressure 145/88, pulse 87, temperature 99.2 F (37.3 C), temperature source Oral, resp. rate 20, height 6' (1.829 m), weight 182 lb 8.7 oz (82.8 kg), SpO2 96.00%.  Well-developed well-nourished black male in no acute distress. HEENT:no sinus tenderness. Mild scleral icterus. NECK: no posterior cervical nodes. LUNGS:clear to auscultation. No CVA tenderness. ZO:XWRUEA S1, S2 without S3. VWU:JWJX nontender. BJY:NWGNFA left a.c. Joint. Bilateral chest wall tenderness to palpation. NEURO:intact.  Lab results: Results for orders placed during the hospital encounter of 03/05/11 (from the past 48 hour(s))  MAGNESIUM     Status:  Normal   Collection Time   03/08/11  5:00 AM      Component Value Range Comment   Magnesium 1.7  1.5 - 2.5 (mg/dL)   PHOSPHORUS     Status: Normal   Collection Time   03/08/11  5:00 AM      Component Value Range Comment   Phosphorus 4.1  2.3 - 4.6 (mg/dL)   FERRITIN     Status: Abnormal   Collection Time   03/08/11  5:00 AM      Component Value Range Comment   Ferritin 1421 (*) 22 - 322 (ng/mL)   PRO B NATRIURETIC PEPTIDE     Status: Abnormal   Collection Time   03/08/11  5:00 AM      Component Value Range Comment   Pro B Natriuretic peptide (BNP) 190.2 (*) 0 - 125 (pg/mL)   TSH     Status: Normal   Collection Time   03/08/11  5:00 AM      Component Value Range Comment   TSH 1.769  0.350 - 4.500 (uIU/mL)   T4, FREE     Status: Normal   Collection Time   03/08/11  5:00 AM      Component Value Range Comment   Free T4 1.51  0.80 - 1.80 (ng/dL)   CBC     Status: Abnormal   Collection Time   03/08/11  5:00 AM      Component Value Range Comment   WBC 20.4 (*) 4.0 - 10.5 (K/uL)    RBC 2.75 (*) 4.22 - 5.81 (MIL/uL)    Hemoglobin 8.6 (*) 13.0 - 17.0 (g/dL)    HCT 21.3 (*) 08.6 - 52.0 (%)    MCV 89.5  78.0 - 100.0 (fL)    MCH 31.3  26.0 - 34.0 (pg)    MCHC 35.0  30.0 - 36.0 (g/dL)    RDW 57.8 (*) 46.9 - 15.5 (%)    Platelets 483 (*) 150 - 400 (K/uL)   DIFFERENTIAL     Status: Abnormal   Collection Time   03/08/11  5:00 AM      Component Value Range Comment   Neutrophils Relative 59  43 - 77 (%)    Neutro Abs 12.0 (*) 1.7 - 7.7 (K/uL)    Lymphocytes Relative 17  12 - 46 (%)    Lymphs Abs 3.4  0.7 - 4.0 (K/uL)    Monocytes Relative 15 (*) 3 - 12 (%)    Monocytes  Absolute 3.2 (*) 0.1 - 1.0 (K/uL)    Eosinophils Relative 8 (*) 0 - 5 (%)    Eosinophils Absolute 1.5 (*) 0.0 - 0.7 (K/uL)    Basophils Relative 1  0 - 1 (%)    Basophils Absolute 0.3 (*) 0.0 - 0.1 (K/uL)   COMPREHENSIVE METABOLIC PANEL     Status: Abnormal   Collection Time   03/08/11  5:00 AM      Component Value Range Comment    Sodium 137  135 - 145 (mEq/L)    Potassium 3.9  3.5 - 5.1 (mEq/L)    Chloride 107  96 - 112 (mEq/L)    CO2 20  19 - 32 (mEq/L)    Glucose, Bld 112 (*) 70 - 99 (mg/dL)    BUN 7  6 - 23 (mg/dL)    Creatinine, Ser 2.95  0.50 - 1.35 (mg/dL)    Calcium 8.8  8.4 - 10.5 (mg/dL)    Total Protein 7.2  6.0 - 8.3 (g/dL)    Albumin 3.7  3.5 - 5.2 (g/dL)    AST 23  0 - 37 (U/L)    ALT 15  0 - 53 (U/L)    Alkaline Phosphatase 98  39 - 117 (U/L)    Total Bilirubin 3.0 (*) 0.3 - 1.2 (mg/dL)    GFR calc non Af Amer >90  >90 (mL/min)    GFR calc Af Amer >90  >90 (mL/min)   CBC     Status: Abnormal   Collection Time   03/09/11  5:55 AM      Component Value Range Comment   WBC 19.7 (*) 4.0 - 10.5 (K/uL)    RBC 2.82 (*) 4.22 - 5.81 (MIL/uL)    Hemoglobin 8.7 (*) 13.0 - 17.0 (g/dL)    HCT 62.1 (*) 30.8 - 52.0 (%)    MCV 89.4  78.0 - 100.0 (fL)    MCH 30.9  26.0 - 34.0 (pg)    MCHC 34.5  30.0 - 36.0 (g/dL)    RDW 65.7 (*) 84.6 - 15.5 (%)    Platelets 524 (*) 150 - 400 (K/uL)   PRO B NATRIURETIC PEPTIDE     Status: Normal   Collection Time   03/09/11  5:55 AM      Component Value Range Comment   Pro B Natriuretic peptide (BNP) 81.2  0 - 125 (pg/mL)   FERRITIN     Status: Abnormal   Collection Time   03/09/11  5:55 AM      Component Value Range Comment   Ferritin 1558 (*) 22 - 322 (ng/mL)     Studies/Results: No results found.  Patient Active Problem List  Diagnoses  . Sickle cell pain crisis  . Leukocytosis  . Chronic pain  . Hypokalemia  . Metabolic acidosis  . Thrombocytosis  . Hemochromatosis  . Hypomagnesemia  . Acute chest syndrome due to sickle cell crisis  . Diarrhea    Impression: Sickle cell crisis. Secondary hemachromatosis. Sickle cell lung disease improving. 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 Hypokalemia improved. Hypomagnesemia improving.   Plan: Continue present therapy. Orthopedic followup his left shoulder. Increase his activity as  tolerated. Anticipate discharge home soon.   [name], Roseana Rhine 03/09/2011 9:37 PM

## 2011-03-10 DIAGNOSIS — E876 Hypokalemia: Secondary | ICD-10-CM | POA: Diagnosis not present

## 2011-03-10 DIAGNOSIS — M87029 Idiopathic aseptic necrosis of unspecified humerus: Secondary | ICD-10-CM | POA: Diagnosis not present

## 2011-03-10 DIAGNOSIS — D5700 Hb-SS disease with crisis, unspecified: Secondary | ICD-10-CM | POA: Diagnosis not present

## 2011-03-10 DIAGNOSIS — F39 Unspecified mood [affective] disorder: Secondary | ICD-10-CM | POA: Diagnosis not present

## 2011-03-10 DIAGNOSIS — D473 Essential (hemorrhagic) thrombocythemia: Secondary | ICD-10-CM | POA: Diagnosis not present

## 2011-03-10 DIAGNOSIS — D72829 Elevated white blood cell count, unspecified: Secondary | ICD-10-CM | POA: Diagnosis not present

## 2011-03-10 LAB — COMPREHENSIVE METABOLIC PANEL
Albumin: 4 g/dL (ref 3.5–5.2)
BUN: 14 mg/dL (ref 6–23)
Creatinine, Ser: 0.66 mg/dL (ref 0.50–1.35)
Total Protein: 7.7 g/dL (ref 6.0–8.3)

## 2011-03-10 LAB — CBC
HCT: 25.2 % — ABNORMAL LOW (ref 39.0–52.0)
MCHC: 34.1 g/dL (ref 30.0–36.0)
MCV: 88.4 fL (ref 78.0–100.0)
RDW: 17.4 % — ABNORMAL HIGH (ref 11.5–15.5)

## 2011-03-10 LAB — HEMOGLOBINOPATHY EVALUATION
Hgb A: 51.9 % — ABNORMAL LOW (ref 96.8–97.8)
Hgb F Quant: 2.6 % — ABNORMAL HIGH (ref 0.0–2.0)
Hgb S Quant: 42.4 % — ABNORMAL HIGH

## 2011-03-10 IMAGING — CT CT HEAD W/O CM
1 of 2 series · 13 of 30 positions shown, 17 images · non-contrast
Comparison: 11/10/2004

CLINICAL DATA: Sickle cell crisis.  Headache.

CT HEAD WITHOUT CONTRAST
TECHNIQUE: Contiguous axial images were obtained from the base of
the skull through the vertex without contrast.

[Series 2: brain · axial · 0.49mm/px · z∈[+166,+325]mm · 13 of 36 slices shown, 17 images]
[im 3/36  brain]
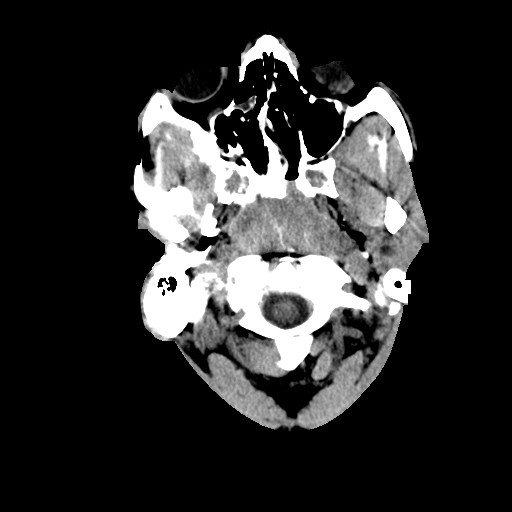
[im 3/36  bone]
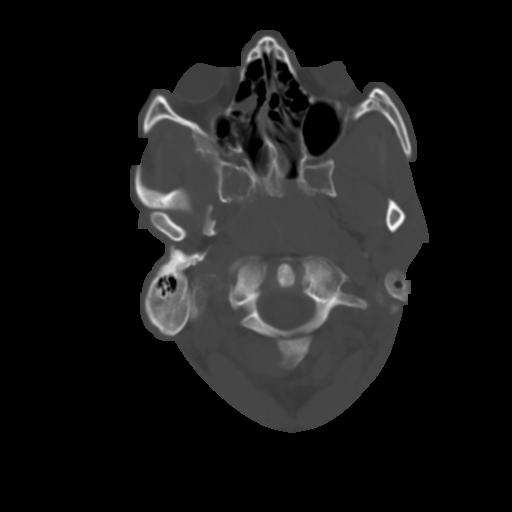
[im 6/36  brain]
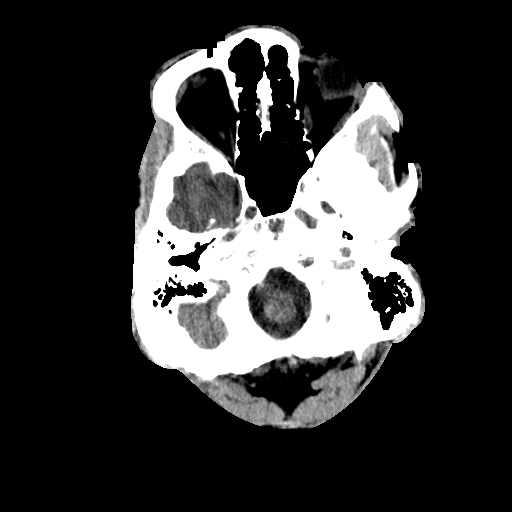
[im 8/36  brain]
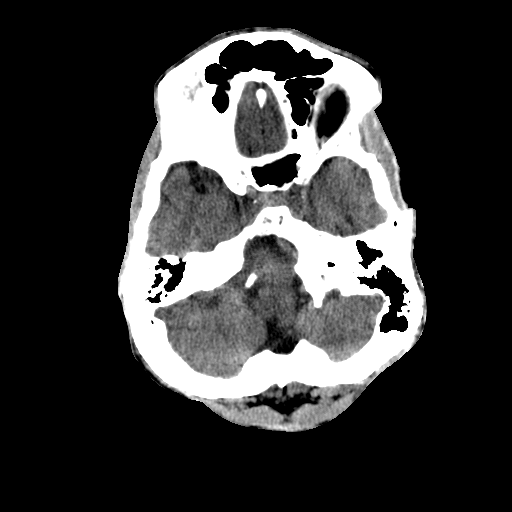
[im 11/36  brain]
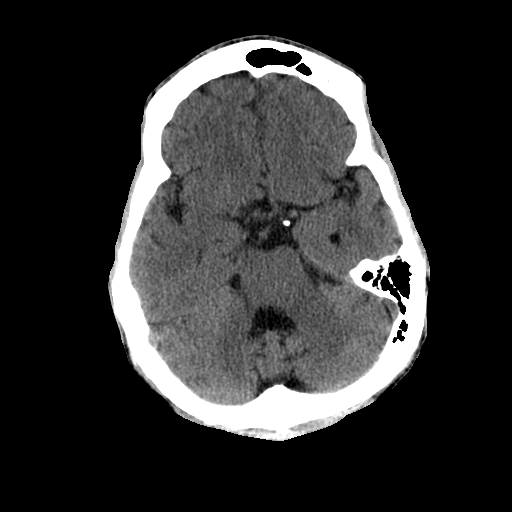
[im 13/36  brain]
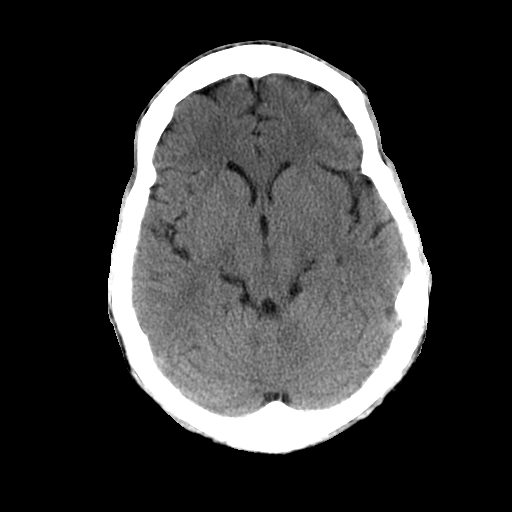
[im 13/36  bone]
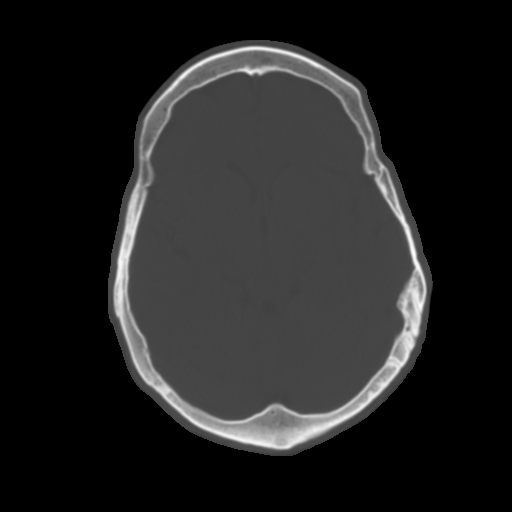
[im 16/36  brain]
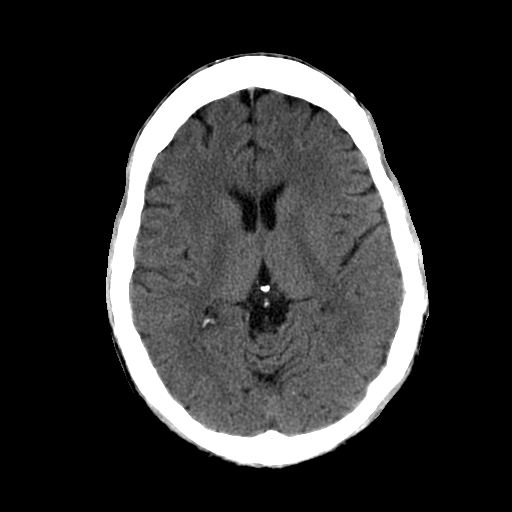
[im 18/36  brain]
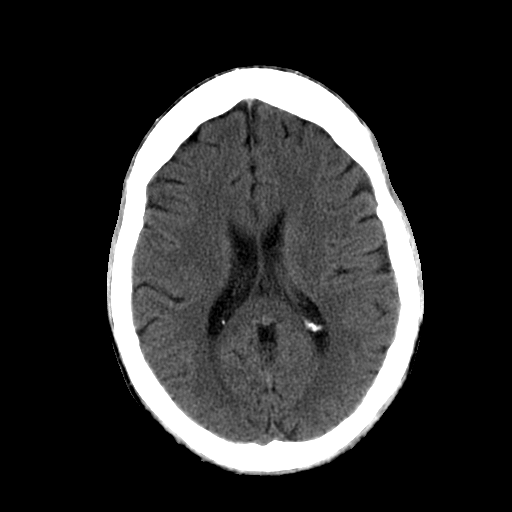
[im 21/36  brain]
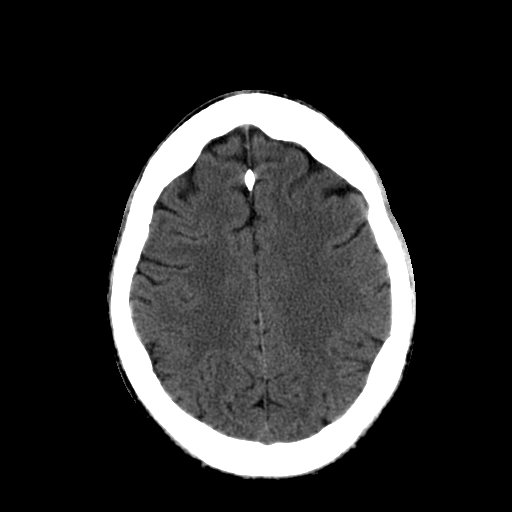
[im 23/36  brain]
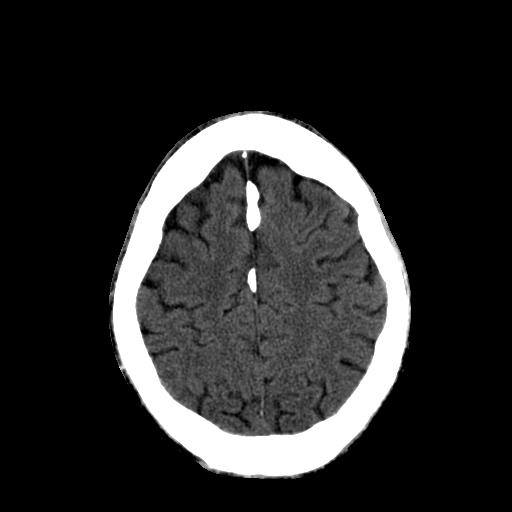
[im 23/36  bone]
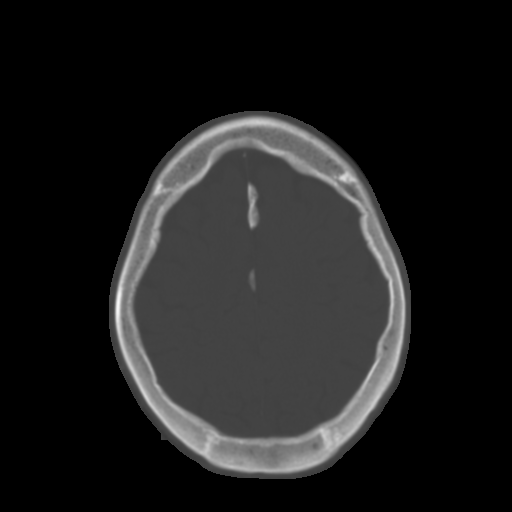
[im 26/36  brain]
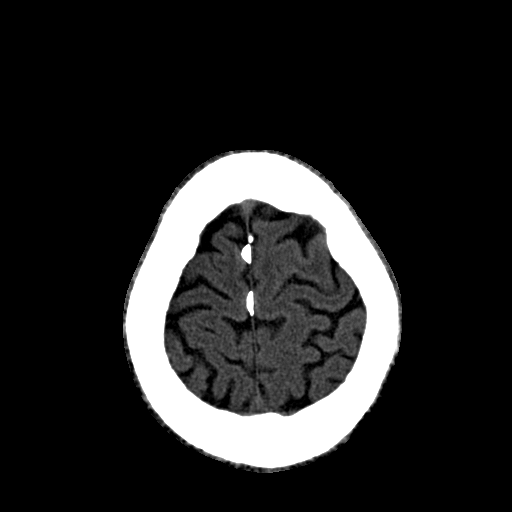
[im 28/36  brain]
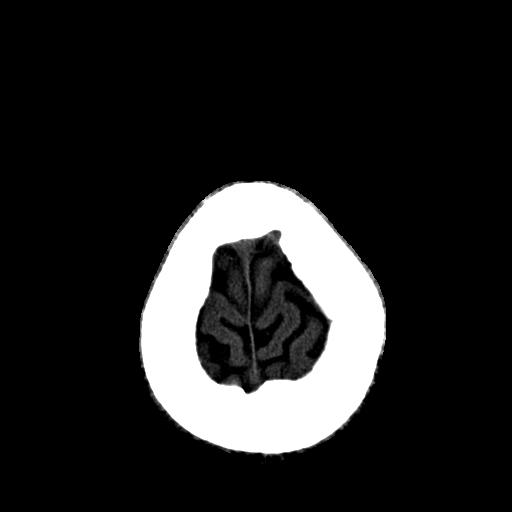
[im 31/36  brain]
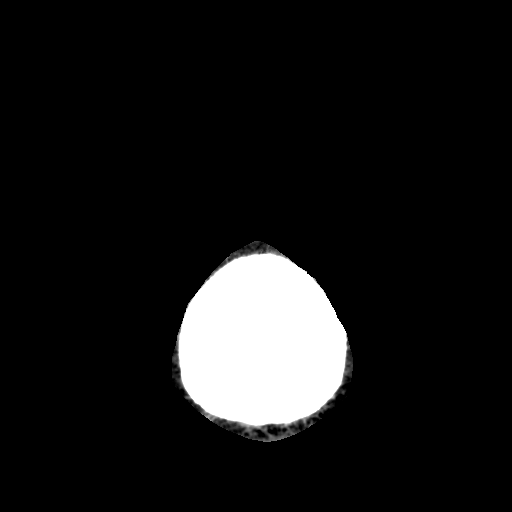
[im 33/36  brain]
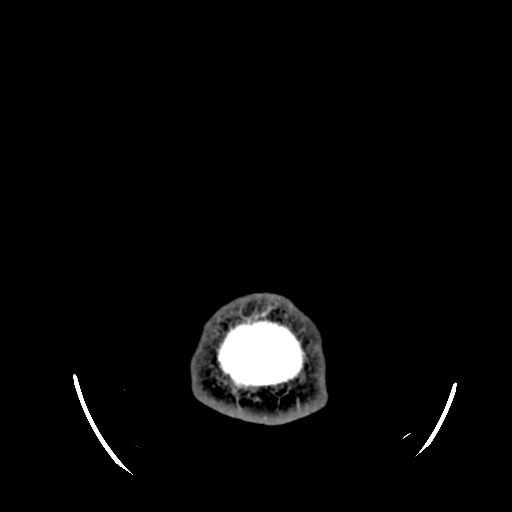
[im 33/36  bone]
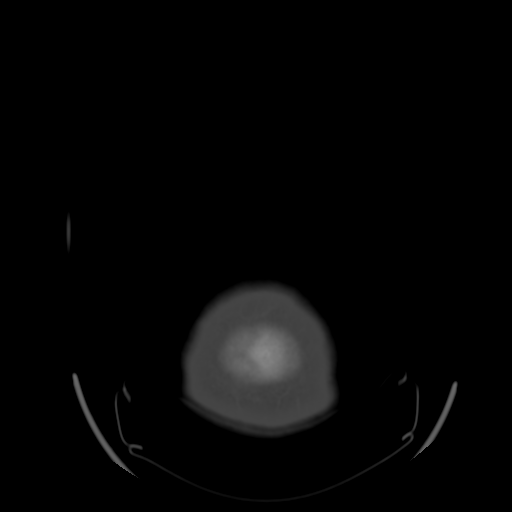

[13 of 30 positions shown; findings below may reference images not displayed]

FINDINGS: The ventricles are normal.  No extra-axial fluid
collections are seen.  The brainstem and cerebellum are
unremarkable.  No acute intracranial findings or mass lesions.

The bony calvarium is intact.  The visualized paranasal sinuses and
mastoid air cells are clear.
IMPRESSION: No acute intracranial findings.  No change since prior head CT.

## 2011-03-10 MED ORDER — DEXTROSE 5 % IV SOLN
2000.0000 mg | Freq: Every day | INTRAVENOUS | Status: DC
  Administered 2011-03-10: 2000 mg via INTRAVENOUS
  Filled 2011-03-10 (×2): qty 2

## 2011-03-10 NOTE — Progress Notes (Signed)
Subjective: The patient was seen on rounds today.  The patient was sitting quietly in his bed watching television.  The patient rates his pain as 5/10 today in his  left flank and LLE.    The patient has a productive cough that is getting better.  The patient is encouraged to use the IS often. The patient states that he might be ready for discharge tomorrow.  No other nursing or patient concerns.   Objective: Vital signs in last 24 hours: Blood pressure 128/83, pulse 74, temperature 98.5 F (36.9 C), temperature source Oral, resp. rate 18, height 6' (1.829 m), weight 182 lb 15.7 oz (83 kg), SpO2 96.00%.  General Appearance: Alert, cooperative, no apparent distress Head: Normocephalic, without obvious abnormality, atraumatic Eyes: PERRLA, EOMI, the patient had slight strabismus, scleral icterus Nose: Nares normal, septum midline, mucosa normal, no drainage or sinus tenderness. Throat:  Normal lips, mucosa, tongue and gums Back: Symmetric, left-sided CVA/flank tenderness, mild diffuse tenderness Resp: CTA, Diminished bibasilar breath sounds, no wheezes/rales/rhonchi Cardio: S1, S2 regular, no murmurs/clicks/rubs/gallops GI: Soft, non-tender, hypoactive bowel sounds, no masses, no organomegaly Male Genitalia: Deferred, the patient denies priapism Extremities: Extremities normal, atraumatic, no cyanosis or edema, negative Homans sign, no sign of DVT Pulses: 2+ and symmetric Skin: Skin color, texture, turgor normal, no rashes or lesions, numerous tattoos Neurologic: Grossly normal, CN II-XII intact, no focal deficits Psych:  Appropriate affect  Lab Results: Results for orders placed during the hospital encounter of 03/05/11 (from the past 24 hour(s))  CBC     Status: Abnormal   Collection Time   03/10/11  4:10 AM      Component Value Range   WBC 19.9 (*) 4.0 - 10.5 (K/uL)   RBC 2.85 (*) 4.22 - 5.81 (MIL/uL)   Hemoglobin 8.6 (*) 13.0 - 17.0 (g/dL)   HCT 40.9 (*) 81.1 - 52.0 (%)   MCV 88.4   78.0 - 100.0 (fL)   MCH 30.2  26.0 - 34.0 (pg)   MCHC 34.1  30.0 - 36.0 (g/dL)   RDW 91.4 (*) 78.2 - 15.5 (%)   Platelets 509 (*) 150 - 400 (K/uL)    Studies/Results: No results found.  Medications: I have reviewed the patient's current medications.  Allergies  Allergen Reactions  . Morphine And Related Rash     Current Facility-Administered Medications  Medication Dose Route Frequency Provider Last Rate Last Dose  . 0.45 % NaCl with KCl 20 mEq / L infusion   Intravenous Continuous Larina Bras, NP 50 mL/hr at 03/10/11 1806    . acetaminophen (TYLENOL) tablet 650 mg  650 mg Oral Q4H PRN Larina Bras, NP      . deferoxamine (DESFERAL) 2,000 mg in dextrose 5 % 500 mL infusion  2,000 mg Intravenous QHS Larina Bras, NP      . diphenhydrAMINE (BENADRYL) injection 12.5-25 mg  12.5-25 mg Intravenous Q6H PRN Larina Bras, NP   25 mg at 03/10/11 1822   Or  . diphenhydrAMINE (BENADRYL) 12.5 MG/5ML elixir 12.5-25 mg  12.5-25 mg Oral Q6H PRN Larina Bras, NP      . enoxaparin (LOVENOX) injection 40 mg  40 mg Subcutaneous Q24H [name], MD   40 mg at 03/10/11 1807  . Flora-Q (FLORA-Q) Capsule 1 capsule  1 capsule Oral Daily Willey Blade, MD   1 capsule at 03/10/11 1015  . folic acid (FOLVITE) tablet 1 mg  1 mg Oral Daily [name], MD   1 mg at 03/10/11 1016  . guaiFENesin-dextromethorphan (  ROBITUSSIN DM) 100-10 MG/5ML syrup 5 mL  5 mL Oral Q4H PRN Larina Bras, NP      . HYDROmorphone (DILAUDID) injection 2-4 mg  2-4 mg Intravenous Q2H Larina Bras, NP   4 mg at 03/10/11 1807  . hydroxyurea (HYDREA) capsule 500 mg  500 mg Oral BID [name], MD   500 mg at 03/10/11 1016  . ibuprofen (ADVIL,MOTRIN) tablet 800 mg  800 mg Oral Q8H PRN [name], MD      . ketorolac (TORADOL) 15 MG/ML injection 15 mg  15 mg Intravenous Q6H Christina Rama, MD   15 mg at 03/10/11 1239  . levofloxacin (LEVAQUIN) tablet 500 mg  500 mg Oral Daily Larina Bras, NP    500 mg at 03/10/11 1015  . naloxone [facility]) injection 0.4 mg  0.4 mg Intravenous PRN Willey Blade, MD       And  . sodium chloride 0.9 % injection 9 mL  9 mL Intravenous PRN Willey Blade, MD      . OLANZapine (ZYPREXA) tablet 10 mg  10 mg Oral QHS [name], MD   10 mg at 03/09/11 2240  . ondansetron (ZOFRAN) tablet 4 mg  4 mg Oral Q4H PRN [name], MD       Or  . ondansetron (ZOFRAN) injection 4 mg  4 mg Intravenous Q4H PRN [name], MD   4 mg at 03/06/11 1035  . oxyCODONE (OXYCONTIN) 12 hr tablet 40 mg  40 mg Oral Q12H Christina Rama, MD   40 mg at 03/10/11 1016  . pantoprazole (PROTONIX) EC tablet 40 mg  40 mg Oral Q0600 Larina Bras, NP   40 mg at 03/10/11 0719  . polyethylene glycol (MIRALAX / GLYCOLAX) packet 17 g  17 g Oral Daily PRN [name], MD      . sodium chloride 0.9 % bolus 1,000 mL  1,000 mL Intravenous Once Glynn Octave, MD      . sodium chloride 0.9 % injection 10-40 mL  10-40 mL Intracatheter PRN [name], MD   10 mL at 03/08/11 1018  . zolpidem (AMBIEN) tablet 10 mg  10 mg Oral QHS PRN [name], MD   10 mg at 03/09/11 2240  . DISCONTD: potassium chloride SA (K-DUR,KLOR-CON) CR tablet 20 mEq  20 mEq Oral TID [name], MD   20 mEq at 03/10/11 1639    Assessment/Plan: Patient Active Problem List  Diagnoses  . Sickle cell pain crisis  . Leukocytosis  . Chronic pain  . Hypokalemia  . Thrombocytosis  . Diarrhea  . Elevated BNP  . Hemochromatosis    Sickle Cell Crisis/Chronic Pain:  The patient continues to be in active hemolysis.   The patient will continue pain/nausea/pruritis management, IV hydration, home medications, GI and DVT prophylaxis. CMP and CBC in the am. Leukocytosis/Thrombocytocis/Productive Cough:  The patient is on PO antibiotics and has Robitussin DM available PRN, IS usage is encouraged Hypokalemia:  Potassium level is WNL - will continue to monitor Diarrhea:  No episode of diarrhea since arrival - will  continue to monitor Elevated BNP:  ProBNP is WNL - will continue to monitor Hemochramtosis:  The patient will receive a Desferal infusion tonight - will continue to monitor  The patient may be ready for discharge within the next 24- 48 hours.   Larina Bras, NP-C 03/10/2011, 3:17 PM

## 2011-03-10 NOTE — Evaluation (Signed)
Physical Therapy Evaluation Patient Details Name: [name] MRN: [id number] DOB: 03/16/1980 Today's Date: 03/10/2011  Problem List:  Patient Active Problem List  Diagnoses  . Sickle cell pain crisis  . Leukocytosis  . Chronic pain  . Hypokalemia  . Metabolic acidosis  . Thrombocytosis  . Hemochromatosis  . Hypomagnesemia  . Acute chest syndrome due to sickle cell crisis  . Diarrhea    Past Medical History:  Past Medical History  Diagnosis Date  . Sickle cell anemia   . Blood transfusion   . 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 Hypokalemia   . Mood disorder   . Pulmonary embolism   . Avascular necrosis   . Leukocytosis     Chronic  . Thrombocytosis     Chronic   Past Surgical History:  Past Surgical History  Procedure Date  . Right hip replacement     08/2006  . Cholecystectomy     01/2008  . Porta cath placement   . Porta cath removal   . Umbilical hernia repair     01/2008  . Excision of left periauricular cyst     10/2009  . Excision of right ear lobe cyst with primary closur     11/2007    PT Assessment/Plan/Recommendation PT Assessment Clinical Impression Statement: pt instructed in back precautions and beginning core stabalization exercises to assist with back pain management.  He acknowledged understanding and was able to perform the exercises No acute PT follow up is needed at this time PT Recommendation/Assessment: Patent does not need any further PT services No Skilled PT: All education completed PT Recommendation Follow Up Recommendations: No PT follow up PT Goals     PT Evaluation Precautions/Restrictions  Precautions Precautions: Posterior Hip Required Braces or Orthoses: No Restrictions Weight Bearing Restrictions: No Prior Functioning    Prior Function Level of Independence: Independent with gait;Independent with basic ADLs Able to Take Stairs?: Yes Cognition Cognition Arousal/Alertness:  Awake/alert Overall Cognitive Status: Appears within functional limits for tasks assessed Orientation Level: Oriented X4 Sensation/Coordination Coordination Gross Motor Movements are Fluid and Coordinated: Yes Fine Motor Movements are Fluid and Coordinated: Yes Extremity Assessment RLE Assessment RLE Assessment: Within Functional Limits LLE Assessment LLE Assessment: Within Functional Limits Mobility (including Balance) Bed Mobility Bed Mobility: Yes (pt independent in all moblity) Transfers Transfers: No Ambulation/Gait Ambulation/Gait: Yes (pt re) Ambulation/Gait Assistance: 7: Independent (pt reports he is independent in walking in the room)    Exercise  Other Exercises Other Exercises: pt instructed in core exercises with abdominal activation along with LE movement.  hard copy in ghost chart Other Exercises: pt issued instructions for back care and instructed in back precautions End of Session PT - End of Session Activity Tolerance: Patient tolerated treatment well Patient left: in bed General Behavior During Session: [facility] for tasks performed Cognition: [facility] for tasks performed  Donnetta Hail 03/10/2011, 2:16 PM

## 2011-03-10 NOTE — Plan of Care (Signed)
Problem: Phase II Progression Outcomes Goal: Discharge plan established Outcome: Progressing Potentially Friday, 2/8

## 2011-03-11 DIAGNOSIS — E876 Hypokalemia: Secondary | ICD-10-CM | POA: Diagnosis not present

## 2011-03-11 DIAGNOSIS — R7989 Other specified abnormal findings of blood chemistry: Secondary | ICD-10-CM

## 2011-03-11 DIAGNOSIS — D72829 Elevated white blood cell count, unspecified: Secondary | ICD-10-CM | POA: Diagnosis not present

## 2011-03-11 DIAGNOSIS — D5700 Hb-SS disease with crisis, unspecified: Secondary | ICD-10-CM | POA: Diagnosis not present

## 2011-03-11 DIAGNOSIS — D473 Essential (hemorrhagic) thrombocythemia: Secondary | ICD-10-CM | POA: Diagnosis not present

## 2011-03-11 DIAGNOSIS — J40 Bronchitis, not specified as acute or chronic: Secondary | ICD-10-CM | POA: Diagnosis present

## 2011-03-11 DIAGNOSIS — M87029 Idiopathic aseptic necrosis of unspecified humerus: Secondary | ICD-10-CM | POA: Diagnosis not present

## 2011-03-11 DIAGNOSIS — F39 Unspecified mood [affective] disorder: Secondary | ICD-10-CM | POA: Diagnosis not present

## 2011-03-11 LAB — CBC
HCT: 23.5 % — ABNORMAL LOW (ref 39.0–52.0)
Hemoglobin: 7.9 g/dL — ABNORMAL LOW (ref 13.0–17.0)
MCV: 89.7 fL (ref 78.0–100.0)
RBC: 2.62 MIL/uL — ABNORMAL LOW (ref 4.22–5.81)
WBC: 17.3 10*3/uL — ABNORMAL HIGH (ref 4.0–10.5)

## 2011-03-11 LAB — COMPREHENSIVE METABOLIC PANEL
AST: 51 U/L — ABNORMAL HIGH (ref 0–37)
BUN: 9 mg/dL (ref 6–23)
CO2: 22 mEq/L (ref 19–32)
Chloride: 103 mEq/L (ref 96–112)
Creatinine, Ser: 0.54 mg/dL (ref 0.50–1.35)
GFR calc non Af Amer: >90 mL/min (ref >90)
Glucose, Bld: 99 mg/dL (ref 70–99)
Total Bilirubin: 3.5 mg/dL — ABNORMAL HIGH (ref 0.3–1.2)

## 2011-03-11 IMAGING — CR DG CHEST 2V
2 series · 2 of 2 positions shown · non-contrast
Comparison: 04/15/2008

CLINICAL DATA: Shortness of breath.

CHEST - 2 VIEW

[w chest pa]
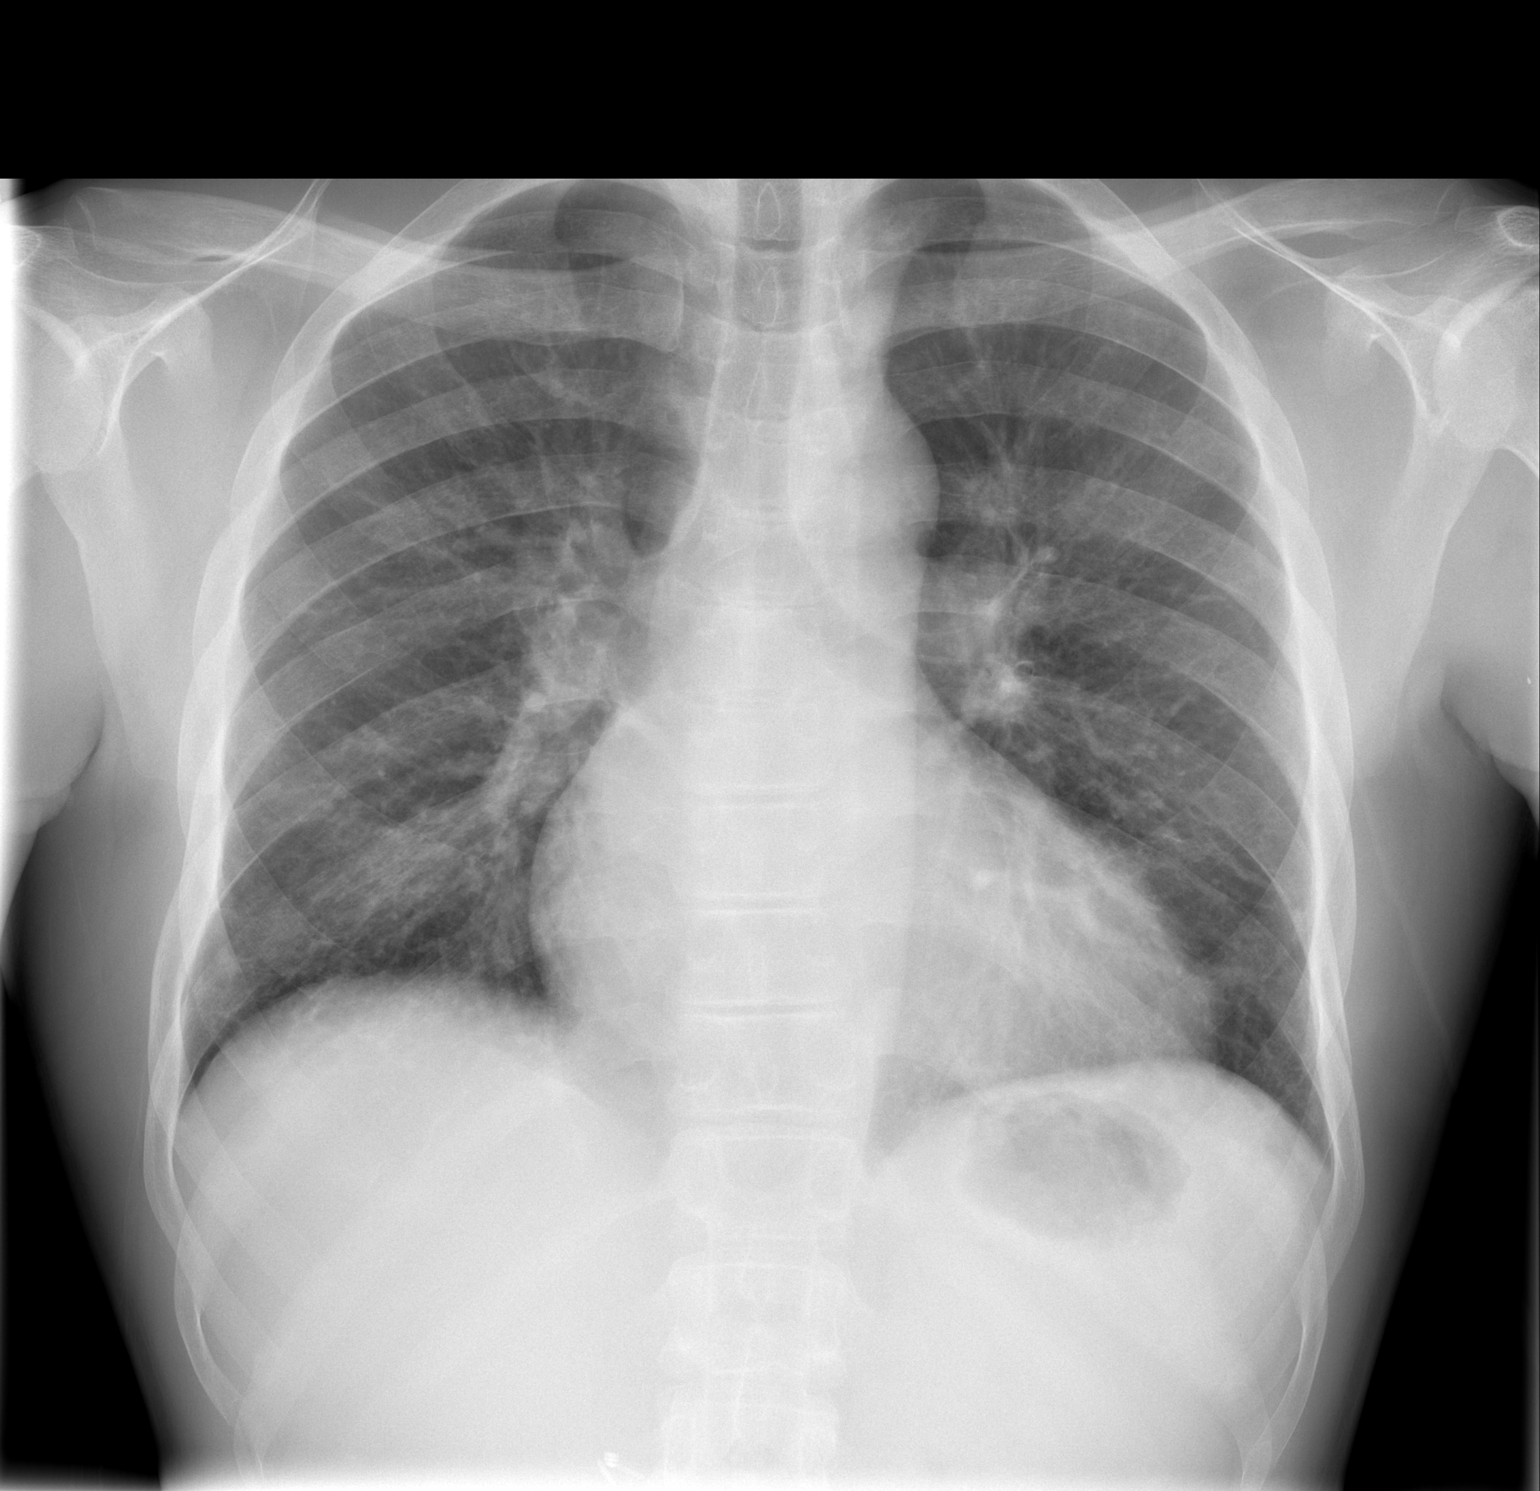

[w chest lat]
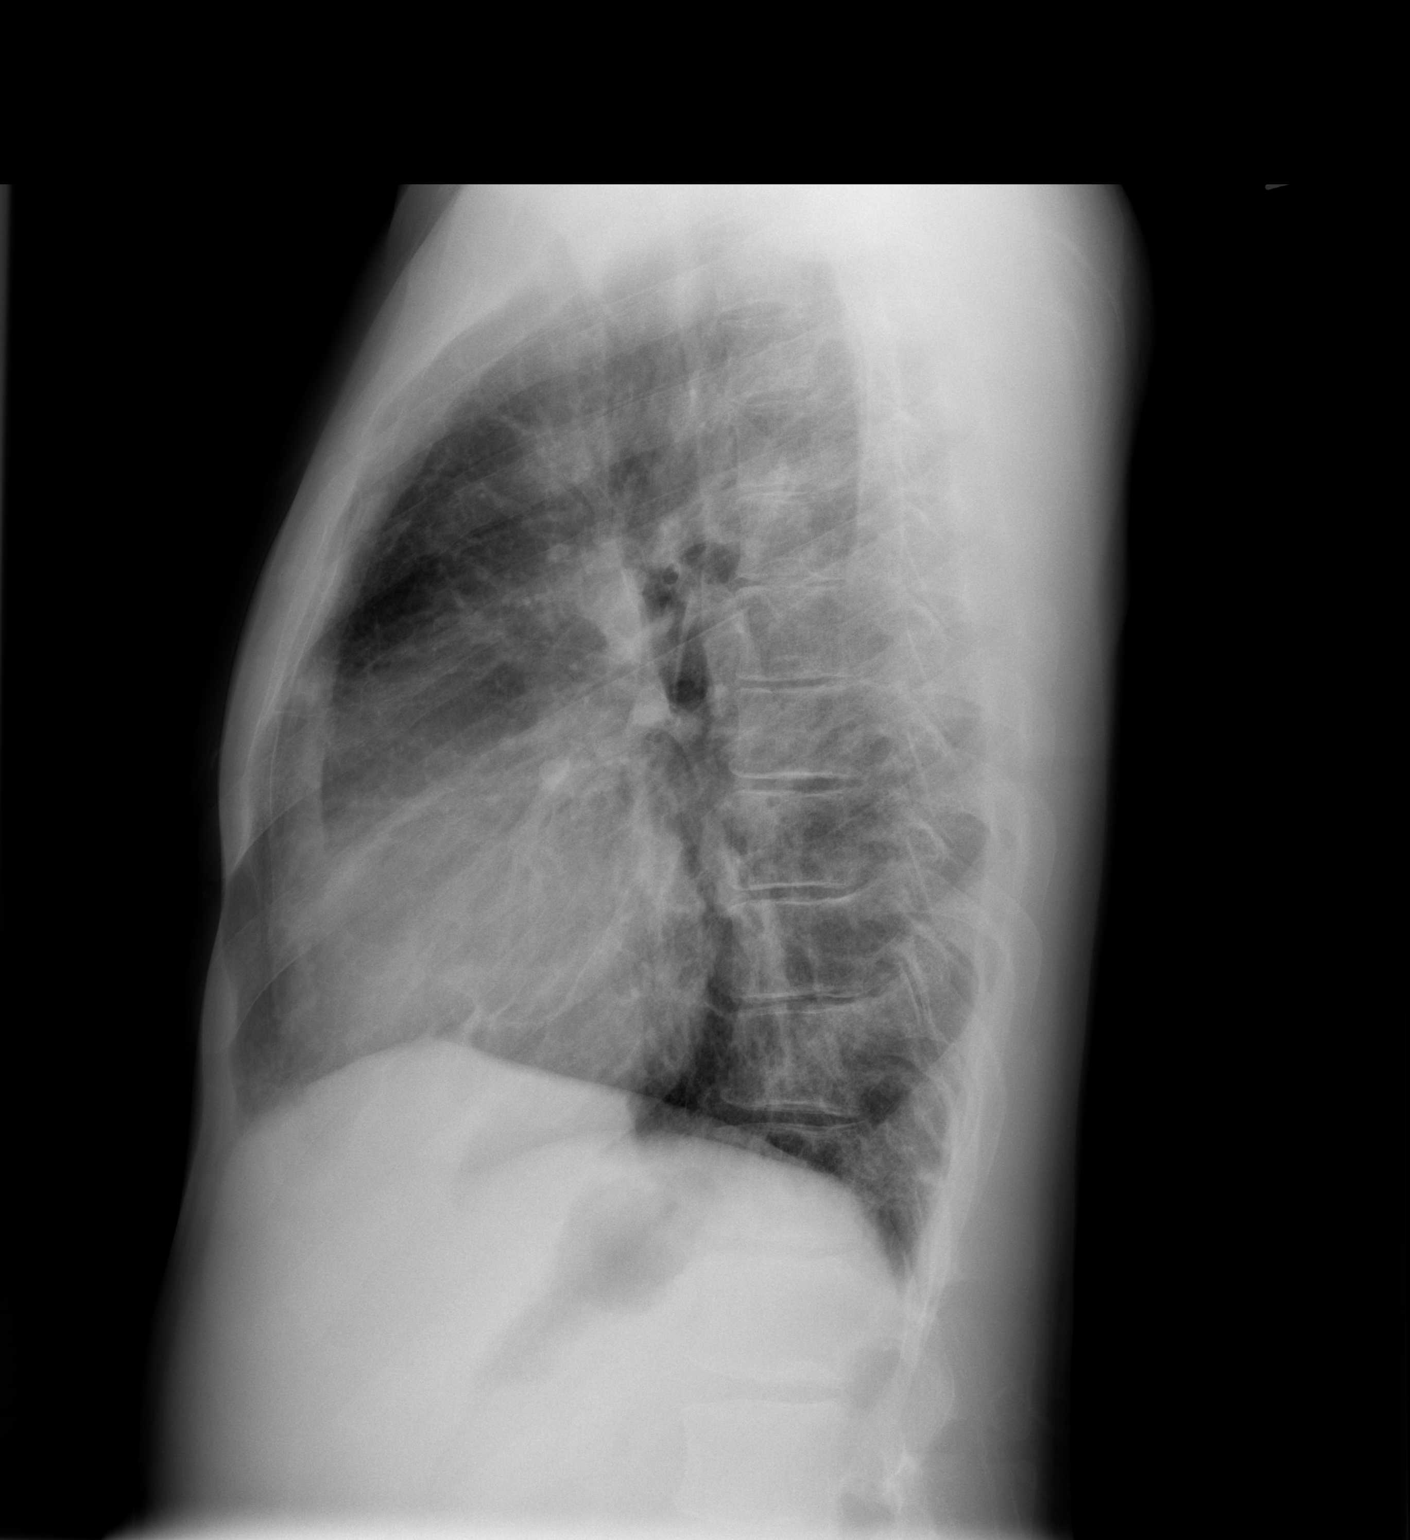

[2 of 2 positions shown; findings below may reference images not displayed]

FINDINGS: Heart is borderline enlarged.  There is mild
peribronchial thickening.  Bibasilar atelectasis or scarring.  No
effusions.  No acute bony abnormality.
IMPRESSION: Borderline heart size.  Mild bronchitic changes.

Bibasilar scar or atelectasis.

## 2011-03-11 MED ORDER — OXYCODONE HCL 40 MG PO TB12
40.0000 mg | ORAL_TABLET | Freq: Once | ORAL | Status: AC
  Administered 2011-03-11: 40 mg via ORAL

## 2011-03-11 MED ORDER — OXYCODONE HCL 80 MG PO TB12: 80.0000 mg | ORAL_TABLET | Freq: Two times a day (BID) | ORAL | Status: DC

## 2011-03-11 MED ORDER — LEVOFLOXACIN 750 MG PO TABS: 750.0000 mg | ORAL_TABLET | Freq: Every day | ORAL | Status: DC

## 2011-03-11 MED ORDER — ZOLPIDEM TARTRATE 10 MG PO TABS: 10.0000 mg | ORAL_TABLET | Freq: Every evening | ORAL | Status: DC | PRN

## 2011-03-11 MED ORDER — POTASSIUM CHLORIDE CRYS ER 20 MEQ PO TBCR: 20.0000 meq | EXTENDED_RELEASE_TABLET | Freq: Every day | ORAL | Status: DC

## 2011-03-11 MED ORDER — FOLIC ACID 1 MG PO TABS: 1.0000 mg | ORAL_TABLET | Freq: Every day | ORAL | Status: DC

## 2011-03-11 MED ORDER — LEVOFLOXACIN 750 MG PO TABS: 750.0000 mg | ORAL_TABLET | Freq: Every day | ORAL | Status: AC

## 2011-03-11 MED ORDER — HYDROMORPHONE HCL 4 MG PO TABS: 4.0000 mg | ORAL_TABLET | ORAL | Status: DC | PRN

## 2011-03-11 NOTE — ED Provider Notes (Signed)
Medical screening examination/treatment/procedure(s) were performed by non-physician practitioner and as supervising physician I was immediately available for consultation/collaboration.  Rokia Bosket, MD 03/11/11 0105 

## 2011-03-11 NOTE — Progress Notes (Signed)
Discharged prescriptions and instructions given to patient.  He verbalized understanding.

## 2011-03-11 NOTE — Discharge Summary (Signed)
Physician Discharge Summary  Patient ID: [name] MRN: [id number] DOB/AGE: February 19, 1980 31 y.o.  Admit date: 03/05/2011 Discharge date: 03/11/2011  Admission Diagnoses: Sickle cell pain crisis  Leukocytosis  Hypokalemia  Thrombocytosis  Diarrhea   Discharge Diagnoses:  Sickle cell pain crisis Chronic Leukocytosis Chronic Thrombocytosis Hypokalemia Elevated BNP Hemochromatosis Bronchitis   Discharged Condition: Stable  Hospital Course:  Please refer to the detailed history and physical for information regarding this patient's admission.  In short, [name] is an 31 year old African-American  male with a past medical history of sickle cell anemia.  He initially presented to the ER was treated with pain medication and discharged home.  The patient returned to the ER the next day complaining of ongoing pain in his back, legs and ribs which had not been alleviated by his home medications. Upon admission, the patient received aggressive pain/nausea/pruritis management, IV hydration, iron chelation therapy, antibiotic therapy, electrolyte repletion, diuresis, blood exchanges/transfusions and consultation with Dr. Dion Saucier, Orthopedic Surgeon for multiple ongoing orthopedic areas of pain.  The patient is currently in stable condition and states that he is ready to manage his pain at home.  The patient will be discharged to home at this time.     Consults:   Dr. Dion Saucier, Orthopedic Surgeon   Significant Diagnostic Studies:  Dg Chest 2 View 03/05/2011  *RADIOLOGY REPORT*  Clinical Data: Chest pain.  Sickle cell disease.  CHEST - 2 VIEW  Comparison: 03/04/2011  Findings: Heart size remains at upper limits of normal.  Diffuse pulmonary interstitial prominence is again seen.  Mild bibasilar scarring is stable.  There is no evidence of acute infiltrate or pleural effusion.  No mass or lymphadenopathy identified.  Surgical clips seen from prior cholecystectomy.  IMPRESSION: Stable  borderline cardiomegaly and diffuse pulmonary interstitial prominence.  No acute findings.  Original Report Authenticated By: Danae Orleans, M.D.   Dg Chest 2 View 03/04/2011  *RADIOLOGY REPORT*  Clinical Data: Bilateral rib pain.  History of sickle cell disease.  CHEST - 2 VIEW  Comparison: Multiple prior chest x-rays, most recently 02/08/2011.  Findings: Previously noted right upper extremity PICC has been removed.  Lung volumes are normal.  No consolidative airspace disease.  There is diffuse interstitial prominence, which appears similar to prior examinations, and likely reflects chronic lung changes related to underlying sickle cell disease.  No pleural effusions.  The heart size is mildly enlarged (unchanged). Mediastinal contours are unremarkable. Surgical clips in right upper quadrant abdomen.  Splenic contours is diminutive.  IMPRESSION:  1.  Diffuse interstitial prominence again noted, consistent with areas of chronic lung scarring related to underlying sickle cell disease.  No focal airspace opacities on today's examination to suggest acute chest syndrome. 2.  Mild cardiomegaly unchanged. 3.  Status post cholecystectomy.  Original Report Authenticated By: Florencia Reasons, M.D.   Treatments:  IV hydration  Antibiotics: Levaquin Analgesia: Dilaudid Anticoagulation: Lovenox Procedures: PICC line  Blood exchanges/transfusions  Discharge Exam: Blood pressure 124/82, pulse 105, temperature 98.5 F (36.9 C), temperature source Oral, resp. rate 20, height 6' (1.829 m), weight 183 lb 3.2 oz (83.1 kg), SpO2 96.00%.  General Appearance: Alert, cooperative, no apparent distress  Head: Normocephalic, without obvious abnormality, atraumatic  Eyes: PERRLA, EOMI, the patient had slight strabismus, scleral icterus  Nose: Nares normal, septum midline, mucosa normal, no drainage or sinus tenderness.  Throat: Normal lips, mucosa, tongue and gums  Back: Symmetric, left-sided CVA/flank tenderness, mild  diffuse tenderness  Resp: CTA, Diminished  bibasilar breath sounds, no wheezes/rales/rhonchi  Cardio: S1, S2 regular, no murmurs/clicks/rubs/gallops  GI: Soft, non-tender, hypoactive bowel sounds, no masses, no organomegaly  Male Genitalia: Deferred, the patient denies priapism  Extremities: Extremities normal, atraumatic, no cyanosis or edema, negative Homans sign, no sign of DVT  Pulses: 2+ and symmetric  Skin: Skin color, texture, turgor normal, no rashes or lesions, numerous tattoos  Neurologic: Grossly normal, CN II-XII intact, no focal deficits  Psych: Appropriate affect   Disposition: Home or Self Care  Discharge Orders    Future Orders Please Complete By Expires   Increase activity slowly      Call MD for:  severe uncontrolled pain      Discharge instructions      Comments:   Take all medications as prescribed Keep well hydrated, keep warm, get plenty of rest KEEP ALL FOLLOW-UP APPOINTMENTS Continue to use incentive spirometer daily Resume potassium on Monday 03/14/11 - Do not take until then      Medication List  As of 03/11/2011  1:27 PM   TAKE these medications         folic acid 1 MG tablet   Commonly known as: FOLVITE   Take 1 mg by mouth daily.      folic acid 1 MG tablet   Commonly known as: FOLVITE   Take 1 tablet (1 mg total) by mouth daily.      HYDROmorphone 4 MG tablet   Commonly known as: DILAUDID   Take 4 mg by mouth every 4 (four) hours as needed. For pain      HYDROmorphone 4 MG tablet   Commonly known as: DILAUDID   Take 1 tablet (4 mg total) by mouth every 4 (four) hours as needed. For pain      hydroxyurea 500 MG capsule   Commonly known as: HYDREA   Take 500 mg by mouth 2 (two) times daily. May take with food to minimize GI side effects.      ibuprofen 800 MG tablet   Commonly known as: ADVIL,MOTRIN   Take 800 mg by mouth every 8 (eight) hours as needed. For pain.      levofloxacin 750 MG tablet   Commonly known as: LEVAQUIN   Take 1  tablet (750 mg total) by mouth daily.      OLANZapine 10 MG tablet   Commonly known as: ZYPREXA   Take 10 mg by mouth at bedtime.      oxyCODONE 80 MG 12 hr tablet   Commonly known as: OXYCONTIN   Take 1 tablet (80 mg total) by mouth every 12 (twelve) hours. pain      potassium chloride SA 20 MEQ tablet   Commonly known as: K-DUR,KLOR-CON   Take 1 tablet (20 mEq total) by mouth daily.      zolpidem 10 MG tablet   Commonly known as: AMBIEN   Take 1 tablet (10 mg total) by mouth at bedtime as needed. For sleep.           Follow-up Information    Follow up with [name], MD.   Contact information:   [address] [place] [address] [phone]       Follow up with [name], [name], MD. Schedule an appointment as soon as possible for a visit in 1 week. (Return to the ER if symptoms worsen)    Contact information:   [address] [facility] [place] [address] [phone]  Follow up with [name], MD. Schedule an appointment as soon as possible for a visit in 2 weeks.   Contact information:   [facility] [address], [address] [place] [address] [phone]         The entire discharge summary including medications and the importance of adherence to outpatient follow-up was discussed with the patient.  He stated he agreed and understood.  All of his questions were answered and his concerns addressed.  Discharge Time:  Greater than 30 minutes   Signed: Larina Bras 03/11/2011, 1:27 PM

## 2011-03-17 DIAGNOSIS — M1610 Unilateral primary osteoarthritis, unspecified hip: Secondary | ICD-10-CM | POA: Diagnosis not present

## 2011-03-24 DIAGNOSIS — D5700 Hb-SS disease with crisis, unspecified: Secondary | ICD-10-CM | POA: Diagnosis not present

## 2011-03-24 DIAGNOSIS — R079 Chest pain, unspecified: Secondary | ICD-10-CM | POA: Diagnosis not present

## 2011-03-24 DIAGNOSIS — J301 Allergic rhinitis due to pollen: Secondary | ICD-10-CM | POA: Diagnosis not present

## 2011-04-13 ENCOUNTER — Encounter (HOSPITAL_COMMUNITY): Payer: Self-pay | Admitting: Emergency Medicine

## 2011-04-13 ENCOUNTER — Emergency Department (HOSPITAL_COMMUNITY): Payer: Medicare Other

## 2011-04-13 ENCOUNTER — Emergency Department (HOSPITAL_COMMUNITY)
Admission: EM | Admit: 2011-04-13 | Discharge: 2011-04-13 | Disposition: A | Discharge status: Home / Self Care | Payer: Medicare Other | Attending: Emergency Medicine | Admitting: Emergency Medicine

## 2011-04-13 DIAGNOSIS — Z86711 Personal history of pulmonary embolism: Secondary | ICD-10-CM | POA: Diagnosis not present

## 2011-04-13 DIAGNOSIS — R05 Cough: Secondary | ICD-10-CM | POA: Diagnosis not present

## 2011-04-13 DIAGNOSIS — Z86718 Personal history of other venous thrombosis and embolism: Secondary | ICD-10-CM | POA: Diagnosis not present

## 2011-04-13 DIAGNOSIS — D5700 Hb-SS disease with crisis, unspecified: Secondary | ICD-10-CM | POA: Diagnosis not present

## 2011-04-13 DIAGNOSIS — R079 Chest pain, unspecified: Secondary | ICD-10-CM | POA: Insufficient documentation

## 2011-04-13 DIAGNOSIS — Z96649 Presence of unspecified artificial hip joint: Secondary | ICD-10-CM | POA: Diagnosis not present

## 2011-04-13 DIAGNOSIS — D473 Essential (hemorrhagic) thrombocythemia: Secondary | ICD-10-CM | POA: Insufficient documentation

## 2011-04-13 DIAGNOSIS — D571 Sickle-cell disease without crisis: Secondary | ICD-10-CM | POA: Diagnosis not present

## 2011-04-13 DIAGNOSIS — R059 Cough, unspecified: Secondary | ICD-10-CM | POA: Diagnosis not present

## 2011-04-13 LAB — COMPREHENSIVE METABOLIC PANEL
ALT: 14 U/L (ref 0–53)
AST: 19 U/L (ref 0–37)
Albumin: 4.5 g/dL (ref 3.5–5.2)
Calcium: 9.5 mg/dL (ref 8.4–10.5)
Creatinine, Ser: 0.52 mg/dL (ref 0.50–1.35)
Sodium: 137 mEq/L (ref 135–145)
Total Protein: 8.7 g/dL — ABNORMAL HIGH (ref 6.0–8.3)

## 2011-04-13 LAB — RETICULOCYTES
RBC.: 3.23 MIL/uL — ABNORMAL LOW (ref 4.22–5.81)
Retic Count, Absolute: 400.5 10*3/uL — ABNORMAL HIGH (ref 19.0–186.0)
Retic Ct Pct: 12.4 % — ABNORMAL HIGH (ref 0.4–3.1)

## 2011-04-13 LAB — CBC
MCH: 30.3 pg (ref 26.0–34.0)
MCV: 89.8 fL (ref 78.0–100.0)
Platelets: 742 10*3/uL — ABNORMAL HIGH (ref 150–400)
RDW: 17.4 % — ABNORMAL HIGH (ref 11.5–15.5)

## 2011-04-13 MED ORDER — HYDROMORPHONE HCL PF 1 MG/ML IJ SOLN
INTRAMUSCULAR | Status: AC
  Administered 2011-04-13: 1 mg
  Filled 2011-04-13: qty 1

## 2011-04-13 MED ORDER — HYDROMORPHONE HCL PF 1 MG/ML IJ SOLN
2.0000 mg | Freq: Once | INTRAMUSCULAR | Status: AC
  Administered 2011-04-13: 1 mg via INTRAVENOUS
  Filled 2011-04-13: qty 1

## 2011-04-13 MED ORDER — DIPHENHYDRAMINE HCL 50 MG/ML IJ SOLN
25.0000 mg | Freq: Once | INTRAMUSCULAR | Status: AC
  Administered 2011-04-13: 09:00:00 via INTRAVENOUS
  Filled 2011-04-13: qty 1

## 2011-04-13 MED ORDER — SODIUM CHLORIDE 0.9 % IV BOLUS (SEPSIS)
1000.0000 mL | Freq: Once | INTRAVENOUS | Status: AC
  Administered 2011-04-13: 1000 mL via INTRAVENOUS

## 2011-04-13 MED ORDER — HYDROMORPHONE HCL PF 1 MG/ML IJ SOLN
2.0000 mg | Freq: Once | INTRAMUSCULAR | Status: AC
  Administered 2011-04-13: 2 mg via INTRAVENOUS
  Filled 2011-04-13: qty 2

## 2011-04-13 NOTE — ED Notes (Signed)
IV team called  For assitance with placing IV

## 2011-04-13 NOTE — ED Provider Notes (Signed)
History     CSN: [id number]  Arrival date & time 04/13/11  4098   First MD Initiated Contact with Patient 04/13/11 0708      No chief complaint on file.   (Consider location/radiation/quality/duration/timing/severity/associated sxs/prior treatment) HPI  H/o sickle cell Hgb SS pw pain. C/O pain waking him up just prior to arrival in b/l thighs and b/l anterior lower ribs. Typical pain for a sickle cell crisis. 7/10 pain at this time. No medications prior to arrival. Denies chest pain, SOB/fever/chills. Min cough x 1 week, improving. Denies numbness/tingling/weakness. Denies abdominal pain/N/V. Ambulatory.    Past Medical History  Diagnosis Date  . Sickle cell anemia   . Blood transfusion   . 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 Hypokalemia   . Mood disorder   . Pulmonary embolism   . Avascular necrosis   . Leukocytosis     Chronic  . Thrombocytosis     Chronic    Past Surgical History  Procedure Date  . Right hip replacement     08/2006  . Cholecystectomy     01/2008  . Porta cath placement   . Porta cath removal   . Umbilical hernia repair     01/2008  . Excision of left periauricular cyst     10/2009  . Excision of right ear lobe cyst with primary closur     11/2007    Family History  Problem Relation Age of Onset  . Sickle cell anemia Mother   . Sickle cell anemia Father   . Sickle cell trait Brother     History  Substance Use Topics  . Smoking status: Former Smoker -- 13 years    Types: Cigarettes    Quit date: 07/08/2010  . Smokeless tobacco: Never Used  . Alcohol Use: No      Review of Systems  All other systems reviewed and are negative.   except as noted HPI   Allergies  Morphine and related  Home Medications   Current Outpatient Rx  Name Route Sig Dispense Refill  . FOLIC ACID 1 MG PO TABS Oral Take 1 tablet (1 mg total) by mouth daily. 60 tablet 2  . HYDROMORPHONE HCL 4 MG PO TABS Oral Take 1 tablet  (4 mg total) by mouth every 4 (four) hours as needed. For pain 60 tablet 0  . HYDROXYUREA 500 MG PO CAPS Oral Take 500 mg by mouth 2 (two) times daily. May take with food to minimize GI side effects.    . IBUPROFEN 800 MG PO TABS Oral Take 800 mg by mouth every 8 (eight) hours as needed. For pain.    Marland Kitchen OLANZAPINE 10 MG PO TABS Oral Take 10 mg by mouth at bedtime.    . OXYCODONE HCL ER 80 MG PO TB12 Oral Take 1 tablet (80 mg total) by mouth every 12 (twelve) hours. pain 40 tablet 0  . POTASSIUM CHLORIDE CRYS ER 20 MEQ PO TBCR Oral Take 1 tablet (20 mEq total) by mouth daily. 30 tablet 1  . ZOLPIDEM TARTRATE 10 MG PO TABS Oral Take 1 tablet (10 mg total) by mouth at bedtime as needed. For sleep. 30 tablet 0    BP 117/76  Pulse 70  Temp(Src) 98.3 F (36.8 C) (Oral)  Resp 24  SpO2 99%  Physical Exam  Nursing note and vitals reviewed. Constitutional: He is oriented to person, place, and time. He appears well-developed and well-nourished. No distress.  HENT:  Head: Atraumatic.  Mouth/Throat: Oropharynx is clear and moist.  Eyes: Conjunctivae are normal. Pupils are equal, round, and reactive to light.  Neck: Neck supple.  Cardiovascular: Normal rate, regular rhythm, normal heart sounds and intact distal pulses.  Exam reveals no gallop and no friction rub.   No murmur heard. Pulmonary/Chest: Effort normal. No respiratory distress. He has no wheezes. He has no rales. He exhibits tenderness.       B/l anterior lower rib ttp  Abdominal: Soft. Bowel sounds are normal. There is tenderness. There is no rebound and no guarding.       Mild epigastric ttp  Musculoskeletal: Normal range of motion. He exhibits no edema and no tenderness.       B/l anterior thigh ttp Distal pulses intact Gross sensation intact  Neurological: He is alert and oriented to person, place, and time.  Skin: Skin is warm and dry.  Psychiatric: He has a normal mood and affect.    ED Course  Procedures (including critical  care time)  Labs Reviewed  CBC - Abnormal; Notable for the following:    WBC 19.8 (*)    RBC 3.23 (*)    Hemoglobin 9.8 (*)    HCT 29.0 (*)    RDW 17.4 (*)    Platelets 742 (*)    All other components within normal limits  COMPREHENSIVE METABOLIC PANEL - Abnormal; Notable for the following:    Potassium 3.0 (*)    Glucose, Bld 102 (*)    BUN 3 (*)    Total Protein 8.7 (*)    Total Bilirubin 2.7 (*)    All other components within normal limits  RETICULOCYTES - Abnormal; Notable for the following:    Retic Ct Pct 12.4 (*)    RBC. 3.23 (*)    Retic Count, Manual 400.5 (*)    All other components within normal limits  LIPASE, BLOOD   Dg Chest 2 View  04/13/2011  *RADIOLOGY REPORT*  Clinical Data: History of cough.  Sickle cell disease.  CHEST - 2 VIEW  Comparison: Multiple priors, most recently chest x-ray 03/05/2011.  Findings: Lung volumes are normal.  No consolidative airspace disease.  No pleural effusions.  Mild diffuse prominence of the interstitial markings is similar compared to prior examinations. Heart size is normal.  Mediastinal contours are within normal limits.  Surgical clips projecting over the right upper quadrant of the abdomen, consistent with prior cholecystectomy.  IMPRESSION: 1.  No radiographic evidence of acute cardiopulmonary disease. 2.  Mild diffuse interstitial prominence is similar compared to numerous prior examinations and likely reflects areas of chronic parenchymal scarring (better demonstrated on prior chest CT dated 12/16/2010). 3.  Status post cholecystectomy.  Original Report Authenticated By: Florencia Reasons, M.D.    1. Sickle cell crisis      MDM  30yoM h/o Sickle Cell HgbSS pw typical sickle cell crisis. Min cough x one week, improving. No hypoxia or shortness of breath, fever. VSS. CXR, pain control, reassess. Anticipate discharge home.   1032  Patient states pain improving. One more dose of dilaudid and likely home.  11:58 AM  Pain  controlled at this time. 4/10. Has prescription for dilaudid at home. Will f/u with his PMD as needed. Precautions for return.   Forbes Cellar, MD 04/13/11 1158

## 2011-04-13 NOTE — Discharge Instructions (Signed)
Sickle Cell Pain Crisis Sickle cell anemia requires regular medical attention by your healthcare provider and awareness about when to seek medical care. Pain is a common problem in children with sickle cell disease. This usually starts at less than 32 year of age. Pain can occur nearly anywhere in the body but most commonly occurs in the extremities, back, chest, or belly (abdomen). Pain episodes can start suddenly or may follow an illness. These attacks can appear as decreased activity, loss of appetite, change in behavior, or simply complaints of pain. DIAGNOSIS   Specialized blood and gene testing can help make this diagnosis early in the disease. Blood tests may then be done to watch blood levels.   Specialized brain scans are done when there are problems in the brain during a crisis.   Lung testing may be done later in the disease.  HOME CARE INSTRUCTIONS   Maintain good hydration. Increase you or your child's fluid intake in hot weather and during exercise.   Avoid smoking. Smoking lowers the oxygen in the blood and can cause the production of sickle-shaped cells (sickling).   Control pain. Only take over-the-counter or prescription medicines for pain, discomfort, or fever as directed by your caregiver. Do not give aspirin to children because of the association with Reye's syndrome.   Keep regular health care checks to keep a proper red blood cell (hemoglobin) level. A moderate anemia level protects against sickling crises.   You and your child should receive all the same immunizations and care as the people around you.   Mothers should breastfeed their babies if possible. Use formulas with iron added if breastfeeding is not possible. Additional iron should not be given unless there is a lack of it. People with sickle cell disease (SCD) build up iron faster than normal. Give folic acid and additional vitamins as directed.   If you or your child has been prescribed antibiotics or other  medications to prevent problems, take them as directed.   Summer camps are available for children with SCD. They may help young people deal with their disease. The camps introduce them to other children with the same problem.   Young people with SCD may become frustrated or angry at their disease. This can cause rebellion and refusal to follow medical care. Help groups or counseling may help with these problems.   Wear a medical alert bracelet. When traveling, keep medical information, caregiver's names, and the medications you or your child takes with you at all times.  SEEK IMMEDIATE MEDICAL CARE IF:   You or your child develops dizziness or fainting, numbness in or difficulty with movement of arms and legs, difficulty with speech, or is acting abnormally. This could be early signs of a stroke. Immediate treatment is necessary.   You or your child has an oral temperature above 102 F (38.9 C), not controlled by medicine.   You or your child has other signs of infection (chills, lethargy, irritability, poor eating, vomiting). The younger the child, the more you should be concerned.   With fevers, do not give medicine to lower the fever right away. This could cover up a problem that is developing. Notify your caregiver.   You or your child develops pain that is not helped with medicine.   You or your child develops shortness of breath or is coughing up pus-like or bloody sputum.   You or your child develops any problems that are new and are causing you to worry.   You or   your child develops a persistent, often uncomfortable and painful penile erection. This is called priapism. Always check young boys for this. It is often embarrassing for them and they may not bring it to your attention. This is a medical emergency and needs immediate treatment. If this is not treated it will lead to impotence.   You or your child develops a new onset of abdominal pain, especially on the left side near the  stomach area.   You or your child has any questions or has problems that are not getting better. Return immediately if you feel your child is getting worse, even if your child was seen only a short while ago.  Document Released: 10/27/2004 Document Revised: 01/06/2011 Document Reviewed: 03/18/2009 [facility] Patient Information 2012 [place], [place].  RESOURCE GUIDE  Dental Problems  Patients with Medicaid: [facility] [phone]                                           [phone] [address] Phone:  [phone]                                                   Phone:  [phone]  If unable to pay or uninsured, contact:  [facility] or [facility]. to become qualified for the adult dental clinic.  Chronic Pain Problems Contact [facility]  [phone] Patients need to be referred by their primary care doctor.  Insufficient Money for Medicine Contact United Way:  call "211" or [facility] [phone].  No Primary Care Doctor Call [facility]  [phone] Other agencies that provide inexpensive medical care    [facility]  [phone]    [facility]  [phone]    [facility]  [phone]    [facility]  [phone]    [facility]  [phone]    [facility]  [phone]  Psychological Services [facility] Behavioral Health  [phone] [facility]  [phone] [facility] Mental Health   [phone] (emergency services [phone])  Abuse/Neglect [facility] Child Abuse Hotline 5144688568 [facility] Child Abuse Hotline 318 815 6032 (After Hours)  Emergency Shelter [facility] Ministries [phone]  Maternity Homes [facility] [place] [facility] [place] 3648168105 [place] Alert Services 5744759584  MRSA Hotline #:   (732) 654-5240    [facility] Resources  [facility] [place]  United Way                            [facility] Dept. [address] [place]                     [address]         [address]  [address]  Cristobal Goldmann Phone:  [phone]                                  Phone:  [phone]                   Phone:  [phone]  [facility] Mental Health Phone:  [phone]  [facility] Child Abuse Hotline 712 794 5037 [phone] (After Hours)

## 2011-04-13 NOTE — ED Notes (Signed)
Iv team at bedside  

## 2011-04-13 NOTE — ED Notes (Signed)
Pt states he woke about an hour ago with pain in his legs and in his ribs

## 2011-04-15 ENCOUNTER — Encounter (HOSPITAL_COMMUNITY): Payer: Self-pay | Admitting: *Deleted

## 2011-04-15 ENCOUNTER — Other Ambulatory Visit: Payer: Self-pay

## 2011-04-15 ENCOUNTER — Emergency Department (HOSPITAL_COMMUNITY)
Admission: EM | Admit: 2011-04-15 | Discharge: 2011-04-15 | Disposition: A | Discharge status: Home / Self Care | Payer: Medicare Other | Attending: Emergency Medicine | Admitting: Emergency Medicine

## 2011-04-15 DIAGNOSIS — D5700 Hb-SS disease with crisis, unspecified: Secondary | ICD-10-CM

## 2011-04-15 DIAGNOSIS — Z86718 Personal history of other venous thrombosis and embolism: Secondary | ICD-10-CM | POA: Insufficient documentation

## 2011-04-15 DIAGNOSIS — R079 Chest pain, unspecified: Secondary | ICD-10-CM | POA: Insufficient documentation

## 2011-04-15 DIAGNOSIS — M25579 Pain in unspecified ankle and joints of unspecified foot: Secondary | ICD-10-CM | POA: Insufficient documentation

## 2011-04-15 LAB — CBC
HCT: 26.8 % — ABNORMAL LOW (ref 39.0–52.0)
Hemoglobin: 9.2 g/dL — ABNORMAL LOW (ref 13.0–17.0)
MCH: 30.7 pg (ref 26.0–34.0)
MCHC: 34.3 g/dL (ref 30.0–36.0)
MCV: 89.3 fL (ref 78.0–100.0)
Platelets: 695 10*3/uL — ABNORMAL HIGH (ref 150–400)
RBC: 3 MIL/uL — ABNORMAL LOW (ref 4.22–5.81)
RDW: 17.1 % — ABNORMAL HIGH (ref 11.5–15.5)
WBC: 17.7 10*3/uL — ABNORMAL HIGH (ref 4.0–10.5)

## 2011-04-15 LAB — RETICULOCYTES
RBC.: 3 MIL/uL — ABNORMAL LOW (ref 4.22–5.81)
Retic Count, Absolute: 276 10*3/uL — ABNORMAL HIGH (ref 19.0–186.0)
Retic Ct Pct: 9.2 % — ABNORMAL HIGH (ref 0.4–3.1)

## 2011-04-15 MED ORDER — SODIUM CHLORIDE 0.9 % IV SOLN
Freq: Once | INTRAVENOUS | Status: AC
  Administered 2011-04-15: 14:00:00 via INTRAVENOUS

## 2011-04-15 MED ORDER — ONDANSETRON HCL 4 MG/2ML IJ SOLN
4.0000 mg | Freq: Once | INTRAMUSCULAR | Status: AC
  Administered 2011-04-15: 4 mg via INTRAVENOUS
  Filled 2011-04-15: qty 2

## 2011-04-15 MED ORDER — HYDROMORPHONE HCL PF 2 MG/ML IJ SOLN
2.0000 mg | Freq: Once | INTRAMUSCULAR | Status: AC
  Administered 2011-04-15: 2 mg via INTRAVENOUS
  Filled 2011-04-15: qty 1

## 2011-04-15 MED ORDER — HYDROMORPHONE HCL PF 2 MG/ML IJ SOLN
2.0000 mg | Freq: Once | INTRAMUSCULAR | Status: AC
Start: 2011-04-15 — End: 2011-04-15
  Administered 2011-04-15: 2 mg via INTRAVENOUS
  Filled 2011-04-15: qty 1

## 2011-04-15 MED ORDER — DROPERIDOL 2.5 MG/ML IJ SOLN
2.5000 mg | Freq: Once | INTRAMUSCULAR | Status: AC
  Administered 2011-04-15: 2.5 mg via INTRAVENOUS
  Filled 2011-04-15: qty 1

## 2011-04-15 MED ORDER — KETOROLAC TROMETHAMINE 15 MG/ML IJ SOLN
15.0000 mg | Freq: Once | INTRAMUSCULAR | Status: AC
  Administered 2011-04-15: 15 mg via INTRAVENOUS
  Filled 2011-04-15: qty 1

## 2011-04-15 MED ORDER — HYDROMORPHONE HCL PF 1 MG/ML IJ SOLN
1.0000 mg | Freq: Once | INTRAMUSCULAR | Status: AC
  Administered 2011-04-15: 1 mg via INTRAVENOUS
  Filled 2011-04-15: qty 1

## 2011-04-15 MED ORDER — HYDROCODONE-ACETAMINOPHEN 5-500 MG PO TABS: 1.0000 | ORAL_TABLET | Freq: Four times a day (QID) | ORAL | Status: AC | PRN

## 2011-04-15 MED ORDER — SODIUM CHLORIDE 0.9 % IV BOLUS (SEPSIS)
1000.0000 mL | Freq: Once | INTRAVENOUS | Status: AC
  Administered 2011-04-15: 1000 mL via INTRAVENOUS

## 2011-04-15 NOTE — ED Notes (Signed)
Pt alert and oriented x4. Respirations even and unlabored, bilateral symmetrical rise and fall of chest. Skin warm and dry. In no acute distress. Denies needs.  Verbalized understanding discharge instructions.

## 2011-04-15 NOTE — Discharge Instructions (Signed)
Sickle Cell Pain Crisis Sickle cell anemia requires regular medical attention by your healthcare provider and awareness about when to seek medical care. Pain is a common problem in children with sickle cell disease. This usually starts at less than 32 year of age. Pain can occur nearly anywhere in the body but most commonly occurs in the extremities, back, chest, or belly (abdomen). Pain episodes can start suddenly or may follow an illness. These attacks can appear as decreased activity, loss of appetite, change in behavior, or simply complaints of pain. DIAGNOSIS   Specialized blood and gene testing can help make this diagnosis early in the disease. Blood tests may then be done to watch blood levels.   Specialized brain scans are done when there are problems in the brain during a crisis.   Lung testing may be done later in the disease.  HOME CARE INSTRUCTIONS   Maintain good hydration. Increase you or your child's fluid intake in hot weather and during exercise.   Avoid smoking. Smoking lowers the oxygen in the blood and can cause the production of sickle-shaped cells (sickling).   Control pain. Only take over-the-counter or prescription medicines for pain, discomfort, or fever as directed by your caregiver. Do not give aspirin to children because of the association with Reye's syndrome.   Keep regular health care checks to keep a proper red blood cell (hemoglobin) level. A moderate anemia level protects against sickling crises.   You and your child should receive all the same immunizations and care as the people around you.   Mothers should breastfeed their babies if possible. Use formulas with iron added if breastfeeding is not possible. Additional iron should not be given unless there is a lack of it. People with sickle cell disease (SCD) build up iron faster than normal. Give folic acid and additional vitamins as directed.   If you or your child has been prescribed antibiotics or other  medications to prevent problems, take them as directed.   Summer camps are available for children with SCD. They may help young people deal with their disease. The camps introduce them to other children with the same problem.   Young people with SCD may become frustrated or angry at their disease. This can cause rebellion and refusal to follow medical care. Help groups or counseling may help with these problems.   Wear a medical alert bracelet. When traveling, keep medical information, caregiver's names, and the medications you or your child takes with you at all times.  SEEK IMMEDIATE MEDICAL CARE IF:   You or your child develops dizziness or fainting, numbness in or difficulty with movement of arms and legs, difficulty with speech, or is acting abnormally. This could be early signs of a stroke. Immediate treatment is necessary.   You or your child has an oral temperature above 102 F (38.9 C), not controlled by medicine.   You or your child has other signs of infection (chills, lethargy, irritability, poor eating, vomiting). The younger the child, the more you should be concerned.   With fevers, do not give medicine to lower the fever right away. This could cover up a problem that is developing. Notify your caregiver.   You or your child develops pain that is not helped with medicine.   You or your child develops shortness of breath or is coughing up pus-like or bloody sputum.   You or your child develops any problems that are new and are causing you to worry.   You or   your child develops a persistent, often uncomfortable and painful penile erection. This is called priapism. Always check young boys for this. It is often embarrassing for them and they may not bring it to your attention. This is a medical emergency and needs immediate treatment. If this is not treated it will lead to impotence.   You or your child develops a new onset of abdominal pain, especially on the left side near the  stomach area.   You or your child has any questions or has problems that are not getting better. Return immediately if you feel your child is getting worse, even if your child was seen only a short while ago.  Document Released: 10/27/2004 Document Revised: 01/06/2011 Document Reviewed: 03/18/2009 [facility] Patient Information 2012 [place], [place].  RESOURCE GUIDE  Dental Problems  Patients with Medicaid: [facility] [phone] [address]                                           [phone] [address] Phone:  [phone]                                                  Phone:  [phone]  If unable to pay or uninsured, contact:  [facility] or [facility] to become qualified for the adult dental clinic.  Chronic Pain Problems Contact [facility]  [phone] Patients need to be referred by their primary care doctor.  Insufficient Money for Medicine Contact United Way:  call "211" or [facility] 5417577827.  No Primary Care Doctor Call [facility]  [phone] Other agencies that provide inexpensive medical care    [facility]  [phone]    [facility] Internal Medicine  [phone]    [facility]  [phone]    [facility]  [phone]    [facility]  [phone]    [facility] Child Clinic  [phone]  Psychological Services [facility] Behavioral Health  [phone] [facility] Services  [phone] [facility] Mental Health   6301735641 (emergency services [phone])  Substance Abuse Resources Alcohol and Drug Services  [phone] [facility] 9198526448 [facility] [place] 2497954207 Floydene Flock [phone] Residential & Outpatient Substance Abuse Program  [phone]  Abuse/Neglect [facility] Child Abuse Hotline 3604950171 [facility] Child Abuse Hotline 605-395-7985 (After Hours)  Emergency Shelter [facility] Ministries [phone]  Maternity Homes Room at the [place] [facility] [place] 6367064303 [place] Alert Services [phone]  MRSA Hotline #:   5752553531    [facility] Resources  [facility] [place]     United Way                          [facility] Dept. [address] [place]                       [address]      [address]  [address]  Sela Hua Phone:  [phone]                                   Phone:  [phone]                 Phone:  [place] Phone:  [place] [phone] (After Hours)

## 2011-04-15 NOTE — ED Notes (Signed)
md alerted pt still in pain

## 2011-04-15 NOTE — ED Notes (Signed)
Pt alert and oriented x4. Respirations even and unlabored, bilateral symmetrical rise and fall of chest. Skin warm and dry. In no acute distress. Denies needs.   

## 2011-04-15 NOTE — ED Notes (Addendum)
Pt reports hx of sickle cell, and in a sickle cell crisis. Pain bil sides of abdomen and bil ankles 7/10, this is pts normal crisis symptoms. Pain started at 0500 today 3/15. Last crisis a few months ago .Bowel sounds present. Lung sounds clear. Hx of blood clots, reports last blood clot 5 months ago. Was taking coumadin but was taken off a few months ago.

## 2011-04-15 NOTE — ED Provider Notes (Signed)
History    31 year old male with bilateral chest pain and ankle pain. Patient has a history of sickle cell anemia and says that this current pain is similar to previous crises. Denies trauma. No fever or chills. No cough or shortness of breath. No rash. No joint swelling. Has been taking Dilaudid at home without much relief. Patient is on folic acid and hydroxyurea which he reports compliance with.  CSN: [id number]  Arrival date & time 04/15/11  4098   First MD Initiated Contact with Patient 04/15/11 [phone]      Chief Complaint  Patient presents with  . Sickle Cell Pain Crisis    (Consider location/radiation/quality/duration/timing/severity/associated sxs/prior treatment) HPI  Past Medical History  Diagnosis Date  . Sickle cell anemia   . Blood transfusion   . 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 Hypokalemia   . Mood disorder   . Pulmonary embolism   . Avascular necrosis   . Leukocytosis     Chronic  . Thrombocytosis     Chronic    Past Surgical History  Procedure Date  . Right hip replacement     08/2006  . Cholecystectomy     01/2008  . Porta cath placement   . Porta cath removal   . Umbilical hernia repair     01/2008  . Excision of left periauricular cyst     10/2009  . Excision of right ear lobe cyst with primary closur     11/2007    Family History  Problem Relation Age of Onset  . Sickle cell anemia Mother   . Sickle cell anemia Father   . Sickle cell trait Brother     History  Substance Use Topics  . Smoking status: Former Smoker -- 13 years    Types: Cigarettes    Quit date: 07/08/2010  . Smokeless tobacco: Never Used  . Alcohol Use: No      Review of Systems   Review of symptoms negative unless otherwise noted in HPI.   Allergies  Morphine and related  Home Medications   Current Outpatient Rx  Name Route Sig Dispense Refill  . FOLIC ACID 1 MG PO TABS Oral Take 1 tablet (1 mg total) by mouth daily. 60  tablet 2  . HYDROMORPHONE HCL 4 MG PO TABS Oral Take 1 tablet (4 mg total) by mouth every 4 (four) hours as needed. For pain 60 tablet 0  . HYDROXYUREA 500 MG PO CAPS Oral Take 500 mg by mouth 2 (two) times daily. May take with food to minimize GI side effects.    . IBUPROFEN 800 MG PO TABS Oral Take 800 mg by mouth every 8 (eight) hours as needed. For pain.    Marland Kitchen OLANZAPINE 10 MG PO TABS Oral Take 10 mg by mouth at bedtime.    . OXYCODONE HCL ER 80 MG PO TB12 Oral Take 1 tablet (80 mg total) by mouth every 12 (twelve) hours. pain 40 tablet 0  . POTASSIUM CHLORIDE CRYS ER 20 MEQ PO TBCR Oral Take 1 tablet (20 mEq total) by mouth daily. 30 tablet 1  . ZOLPIDEM TARTRATE 10 MG PO TABS Oral Take 1 tablet (10 mg total) by mouth at bedtime as needed. For sleep. 30 tablet 0    BP 117/71  Pulse 76  Temp(Src) 97.2 F (36.2 C) (Oral)  Resp 16  SpO2 100%  Physical Exam  Nursing note and vitals reviewed. Constitutional: He is oriented to person, place, and  time. He appears well-developed and well-nourished. No distress.  HENT:  Head: Normocephalic and atraumatic.  Eyes: Conjunctivae are normal. Right eye exhibits no discharge. Left eye exhibits no discharge.  Neck: Normal range of motion. Neck supple.  Cardiovascular: Normal rate, regular rhythm and normal heart sounds.  Exam reveals no gallop and no friction rub.   No murmur heard. Pulmonary/Chest: Effort normal and breath sounds normal. No respiratory distress. He has no wheezes. He has no rales.  Abdominal: Soft. He exhibits no distension. There is no tenderness.  Musculoskeletal: He exhibits no edema and no tenderness.  Neurological: He is alert and oriented to person, place, and time. No cranial nerve deficit. He exhibits normal muscle tone. Coordination normal.       Large joints are without effusion or significant pain with range of motion.  Skin: Skin is warm and dry. He is not diaphoretic.  Psychiatric: He has a normal mood and affect.  His behavior is normal. Thought content normal.    ED Course  Procedures (including critical care time)  Labs Reviewed  CBC - Abnormal; Notable for the following:    WBC 17.7 (*)    RBC 3.00 (*)    Hemoglobin 9.2 (*)    HCT 26.8 (*)    RDW 17.1 (*)    Platelets 695 (*)    All other components within normal limits  RETICULOCYTES - Abnormal; Notable for the following:    Retic Ct Pct 9.2 (*)    RBC. 3.00 (*)    Retic Count, Manual 276.0 (*)    All other components within normal limits   No results found.   1. Sickle cell crisis       MDM  31 year old male with chest and bilateral ankle pain. He is not complaining of any dyspnea and is not hypoxic. No respiratory distress on examination. He is afebrile. Very low suspicion for acute chest. Patient is currently feeling much better. He says pain is well-controlled and has no complaints prior discharge. He was smiling and laughing with a friend at bedside upon discharge Return precautions were discussed. Pain medication prescription is provided. outpatient followup. Marland Kitchen       Raeford Razor, MD 04/17/11 [phone]

## 2011-04-15 NOTE — ED Notes (Signed)
Pt reports no hx of mrsa, that it was a mistake, unsure of correct hx.

## 2011-04-17 ENCOUNTER — Emergency Department (HOSPITAL_COMMUNITY): Payer: Medicare Other

## 2011-04-17 ENCOUNTER — Encounter (HOSPITAL_COMMUNITY): Payer: Self-pay | Admitting: *Deleted

## 2011-04-17 ENCOUNTER — Inpatient Hospital Stay (HOSPITAL_COMMUNITY)
Admission: EM | Admit: 2011-04-17 | Discharge: 2011-04-29 | DRG: 811 | Disposition: A | Discharge status: Home / Self Care | Payer: Medicare Other | Attending: Internal Medicine | Admitting: Internal Medicine

## 2011-04-17 DIAGNOSIS — D5700 Hb-SS disease with crisis, unspecified: Principal | ICD-10-CM | POA: Diagnosis present

## 2011-04-17 DIAGNOSIS — H5316 Psychophysical visual disturbances: Secondary | ICD-10-CM | POA: Diagnosis not present

## 2011-04-17 DIAGNOSIS — D72829 Elevated white blood cell count, unspecified: Secondary | ICD-10-CM | POA: Diagnosis not present

## 2011-04-17 DIAGNOSIS — Z86711 Personal history of pulmonary embolism: Secondary | ICD-10-CM | POA: Diagnosis not present

## 2011-04-17 DIAGNOSIS — IMO0002 Reserved for concepts with insufficient information to code with codable children: Secondary | ICD-10-CM | POA: Diagnosis not present

## 2011-04-17 DIAGNOSIS — I82C11 Acute embolism and thrombosis of right internal jugular vein: Secondary | ICD-10-CM

## 2011-04-17 DIAGNOSIS — F29 Unspecified psychosis not due to a substance or known physiological condition: Secondary | ICD-10-CM | POA: Diagnosis not present

## 2011-04-17 DIAGNOSIS — D7289 Other specified disorders of white blood cells: Secondary | ICD-10-CM | POA: Diagnosis not present

## 2011-04-17 DIAGNOSIS — D473 Essential (hemorrhagic) thrombocythemia: Secondary | ICD-10-CM | POA: Diagnosis present

## 2011-04-17 DIAGNOSIS — D571 Sickle-cell disease without crisis: Secondary | ICD-10-CM | POA: Diagnosis not present

## 2011-04-17 DIAGNOSIS — G9341 Metabolic encephalopathy: Secondary | ICD-10-CM

## 2011-04-17 DIAGNOSIS — I369 Nonrheumatic tricuspid valve disorder, unspecified: Secondary | ICD-10-CM | POA: Diagnosis not present

## 2011-04-17 DIAGNOSIS — Z86718 Personal history of other venous thrombosis and embolism: Secondary | ICD-10-CM | POA: Diagnosis not present

## 2011-04-17 DIAGNOSIS — R748 Abnormal levels of other serum enzymes: Secondary | ICD-10-CM | POA: Diagnosis not present

## 2011-04-17 DIAGNOSIS — R109 Unspecified abdominal pain: Secondary | ICD-10-CM | POA: Diagnosis not present

## 2011-04-17 DIAGNOSIS — M8700 Idiopathic aseptic necrosis of unspecified bone: Secondary | ICD-10-CM | POA: Diagnosis not present

## 2011-04-17 DIAGNOSIS — R110 Nausea: Secondary | ICD-10-CM | POA: Diagnosis not present

## 2011-04-17 DIAGNOSIS — E876 Hypokalemia: Secondary | ICD-10-CM | POA: Diagnosis not present

## 2011-04-17 DIAGNOSIS — Z79899 Other long term (current) drug therapy: Secondary | ICD-10-CM | POA: Diagnosis not present

## 2011-04-17 DIAGNOSIS — I82C19 Acute embolism and thrombosis of unspecified internal jugular vein: Secondary | ICD-10-CM | POA: Diagnosis not present

## 2011-04-17 DIAGNOSIS — J9819 Other pulmonary collapse: Secondary | ICD-10-CM | POA: Diagnosis not present

## 2011-04-17 DIAGNOSIS — Z96649 Presence of unspecified artificial hip joint: Secondary | ICD-10-CM | POA: Diagnosis not present

## 2011-04-17 DIAGNOSIS — Z87891 Personal history of nicotine dependence: Secondary | ICD-10-CM | POA: Diagnosis not present

## 2011-04-17 DIAGNOSIS — R000 Tachycardia, unspecified: Secondary | ICD-10-CM | POA: Diagnosis not present

## 2011-04-17 DIAGNOSIS — D573 Sickle-cell trait: Secondary | ICD-10-CM | POA: Diagnosis not present

## 2011-04-17 DIAGNOSIS — D5701 Hb-SS disease with acute chest syndrome: Secondary | ICD-10-CM | POA: Diagnosis not present

## 2011-04-17 DIAGNOSIS — Z452 Encounter for adjustment and management of vascular access device: Secondary | ICD-10-CM | POA: Diagnosis not present

## 2011-04-17 DIAGNOSIS — E8779 Other fluid overload: Secondary | ICD-10-CM | POA: Diagnosis not present

## 2011-04-17 DIAGNOSIS — I10 Essential (primary) hypertension: Secondary | ICD-10-CM | POA: Diagnosis not present

## 2011-04-17 DIAGNOSIS — R0602 Shortness of breath: Secondary | ICD-10-CM | POA: Diagnosis not present

## 2011-04-17 DIAGNOSIS — I471 Supraventricular tachycardia, unspecified: Secondary | ICD-10-CM | POA: Diagnosis not present

## 2011-04-17 DIAGNOSIS — G8929 Other chronic pain: Secondary | ICD-10-CM | POA: Diagnosis present

## 2011-04-17 DIAGNOSIS — R079 Chest pain, unspecified: Secondary | ICD-10-CM | POA: Diagnosis not present

## 2011-04-17 DIAGNOSIS — R002 Palpitations: Secondary | ICD-10-CM | POA: Diagnosis not present

## 2011-04-17 LAB — BASIC METABOLIC PANEL
Calcium: 8.4 mg/dL (ref 8.4–10.5)
Chloride: 109 mEq/L (ref 96–112)
Creatinine, Ser: 0.5 mg/dL (ref 0.50–1.35)
GFR calc Af Amer: >90 mL/min (ref >90)
GFR calc non Af Amer: >90 mL/min (ref >90)

## 2011-04-17 LAB — DIFFERENTIAL
Basophils Absolute: 0.3 10*3/uL — ABNORMAL HIGH (ref 0.0–0.1)
Eosinophils Absolute: 0.3 10*3/uL (ref 0.0–0.7)
Lymphs Abs: 4.3 10*3/uL — ABNORMAL HIGH (ref 0.7–4.0)
Monocytes Absolute: 2.5 10*3/uL — ABNORMAL HIGH (ref 0.1–1.0)
Monocytes Relative: 10 % (ref 3–12)

## 2011-04-17 LAB — URINALYSIS, ROUTINE W REFLEX MICROSCOPIC
Hgb urine dipstick: NEGATIVE
Protein, ur: NEGATIVE mg/dL
Urobilinogen, UA: 1 mg/dL (ref 0.0–1.0)

## 2011-04-17 LAB — MRSA PCR SCREENING: MRSA by PCR: INVALID — AB

## 2011-04-17 LAB — CBC
MCHC: 34.5 g/dL (ref 30.0–36.0)
MCV: 89 fL (ref 78.0–100.0)
Platelets: 611 10*3/uL — ABNORMAL HIGH (ref 150–400)
RDW: 17.8 % — ABNORMAL HIGH (ref 11.5–15.5)
WBC: 25.4 10*3/uL — ABNORMAL HIGH (ref 4.0–10.5)

## 2011-04-17 LAB — CARDIAC PANEL(CRET KIN+CKTOT+MB+TROPI): Relative Index: INVALID (ref 0.0–2.5)

## 2011-04-17 LAB — RETICULOCYTES: Retic Ct Pct: 10.3 % — ABNORMAL HIGH (ref 0.4–3.1)

## 2011-04-17 MED ORDER — OXYCODONE HCL 40 MG PO TB12
80.0000 mg | ORAL_TABLET | Freq: Once | ORAL | Status: AC
  Administered 2011-04-17: 80 mg via ORAL

## 2011-04-17 MED ORDER — HYDROMORPHONE HCL PF 2 MG/ML IJ SOLN
2.0000 mg | Freq: Once | INTRAMUSCULAR | Status: AC
  Administered 2011-04-17: 2 mg via INTRAVENOUS
  Filled 2011-04-17: qty 1

## 2011-04-17 MED ORDER — GUAIFENESIN-DM 100-10 MG/5ML PO SYRP: 5.0000 mL | ORAL_SOLUTION | ORAL | Status: DC | PRN

## 2011-04-17 MED ORDER — FOLIC ACID 1 MG PO TABS
1.0000 mg | ORAL_TABLET | Freq: Every day | ORAL | Status: DC
  Administered 2011-04-17 – 2011-04-29 (×13): 1 mg via ORAL
  Filled 2011-04-17 (×13): qty 1

## 2011-04-17 MED ORDER — OLANZAPINE 10 MG PO TABS
10.0000 mg | ORAL_TABLET | Freq: Every day | ORAL | Status: DC
  Administered 2011-04-17 – 2011-04-28 (×10): 10 mg via ORAL
  Filled 2011-04-17 (×13): qty 1

## 2011-04-17 MED ORDER — HYDROMORPHONE HCL PF 2 MG/ML IJ SOLN
2.0000 mg | INTRAMUSCULAR | Status: DC | PRN
  Administered 2011-04-17 (×5): 2 mg via INTRAVENOUS
  Filled 2011-04-17 (×5): qty 1

## 2011-04-17 MED ORDER — SODIUM CHLORIDE 0.9 % IJ SOLN
10.0000 mL | INTRAMUSCULAR | Status: DC | PRN
Start: 2011-04-17 — End: 2011-04-29
  Administered 2011-04-19 – 2011-04-29 (×11): 10 mL

## 2011-04-17 MED ORDER — DIPHENHYDRAMINE HCL 50 MG/ML IJ SOLN
25.0000 mg | Freq: Four times a day (QID) | INTRAMUSCULAR | Status: DC | PRN
  Administered 2011-04-17 – 2011-04-18 (×5): 25 mg via INTRAVENOUS
  Filled 2011-04-17 (×5): qty 1

## 2011-04-17 MED ORDER — HYDROMORPHONE HCL PF 2 MG/ML IJ SOLN
2.0000 mg | INTRAMUSCULAR | Status: AC | PRN
  Administered 2011-04-17: 2 mg via INTRAVENOUS
  Administered 2011-04-18: 4 mg via INTRAVENOUS
  Filled 2011-04-17: qty 2
  Filled 2011-04-17: qty 1

## 2011-04-17 MED ORDER — ZOLPIDEM TARTRATE 5 MG PO TABS
5.0000 mg | ORAL_TABLET | Freq: Every evening | ORAL | Status: DC | PRN
  Administered 2011-04-17 – 2011-04-28 (×10): 5 mg via ORAL
  Filled 2011-04-17 (×10): qty 1

## 2011-04-17 MED ORDER — ENOXAPARIN SODIUM 40 MG/0.4ML ~~LOC~~ SOLN
40.0000 mg | SUBCUTANEOUS | Status: DC
  Administered 2011-04-17 – 2011-04-28 (×11): 40 mg via SUBCUTANEOUS
  Filled 2011-04-17 (×13): qty 0.4

## 2011-04-17 MED ORDER — POTASSIUM CHLORIDE CRYS ER 20 MEQ PO TBCR: 20.0000 meq | EXTENDED_RELEASE_TABLET | Freq: Every day | ORAL | Status: DC

## 2011-04-17 MED ORDER — SODIUM CHLORIDE 0.9 % IJ SOLN
10.0000 mL | Freq: Two times a day (BID) | INTRAMUSCULAR | Status: DC
  Administered 2011-04-20: 10 mL
  Administered 2011-04-21: 20 mL
  Administered 2011-04-24 – 2011-04-25 (×2): 10 mL

## 2011-04-17 MED ORDER — SODIUM CHLORIDE 0.9 % IV SOLN
INTRAVENOUS | Status: DC
  Administered 2011-04-17 – 2011-04-18 (×3): via INTRAVENOUS

## 2011-04-17 MED ORDER — OXYCODONE HCL 40 MG PO TB12
80.0000 mg | ORAL_TABLET | Freq: Two times a day (BID) | ORAL | Status: DC
  Administered 2011-04-18 – 2011-04-29 (×23): 80 mg via ORAL
  Filled 2011-04-17 (×24): qty 2

## 2011-04-17 MED ORDER — ONDANSETRON HCL 4 MG PO TABS: 4.0000 mg | ORAL_TABLET | Freq: Four times a day (QID) | ORAL | Status: DC | PRN

## 2011-04-17 MED ORDER — SODIUM CHLORIDE 0.9 % IV SOLN
INTRAVENOUS | Status: DC
  Administered 2011-04-17: 10:00:00 via INTRAVENOUS

## 2011-04-17 MED ORDER — IBUPROFEN 800 MG PO TABS
800.0000 mg | ORAL_TABLET | Freq: Four times a day (QID) | ORAL | Status: DC | PRN
  Filled 2011-04-17 (×2): qty 1

## 2011-04-17 MED ORDER — ONDANSETRON HCL 4 MG/2ML IJ SOLN
4.0000 mg | Freq: Once | INTRAMUSCULAR | Status: AC
  Administered 2011-04-17: 4 mg via INTRAVENOUS
  Filled 2011-04-17: qty 2

## 2011-04-17 MED ORDER — ONDANSETRON HCL 4 MG/2ML IJ SOLN
4.0000 mg | Freq: Three times a day (TID) | INTRAMUSCULAR | Status: DC | PRN
  Administered 2011-04-17: 4 mg via INTRAVENOUS
  Filled 2011-04-17: qty 2

## 2011-04-17 MED ORDER — ENOXAPARIN SODIUM 40 MG/0.4ML ~~LOC~~ SOLN: 40.0000 mg | SUBCUTANEOUS | Status: DC

## 2011-04-17 MED ORDER — HYDROMORPHONE HCL PF 2 MG/ML IJ SOLN: 2.0000 mg | INTRAMUSCULAR | Status: DC | PRN

## 2011-04-17 MED ORDER — POTASSIUM CHLORIDE CRYS ER 20 MEQ PO TBCR
40.0000 meq | EXTENDED_RELEASE_TABLET | Freq: Two times a day (BID) | ORAL | Status: DC
  Administered 2011-04-17 – 2011-04-18 (×3): 40 meq via ORAL
  Filled 2011-04-17 (×6): qty 2

## 2011-04-17 MED ORDER — DIPHENHYDRAMINE HCL 50 MG/ML IJ SOLN: 12.5000 mg | Freq: Four times a day (QID) | INTRAMUSCULAR | Status: DC | PRN

## 2011-04-17 MED ORDER — ONDANSETRON HCL 4 MG/2ML IJ SOLN: 4.0000 mg | Freq: Four times a day (QID) | INTRAMUSCULAR | Status: DC | PRN

## 2011-04-17 MED ORDER — SODIUM CHLORIDE 0.9 % IV BOLUS (SEPSIS)
500.0000 mL | Freq: Once | INTRAVENOUS | Status: AC
  Administered 2011-04-17: 1000 mL via INTRAVENOUS

## 2011-04-17 MED ORDER — DEXTROSE 5 % IV SOLN
1.0000 g | INTRAVENOUS | Status: DC
  Administered 2011-04-17 – 2011-04-19 (×3): 1 g via INTRAVENOUS
  Filled 2011-04-17 (×3): qty 10

## 2011-04-17 MED ORDER — SODIUM CHLORIDE 0.9 % IJ SOLN
3.0000 mL | Freq: Two times a day (BID) | INTRAMUSCULAR | Status: DC
  Administered 2011-04-18 – 2011-04-25 (×4): 3 mL via INTRAVENOUS

## 2011-04-17 MED ORDER — HYDROMORPHONE HCL PF 2 MG/ML IJ SOLN
2.0000 mg | Freq: Once | INTRAMUSCULAR | Status: AC
  Administered 2011-04-17: 2 mg via INTRAMUSCULAR
  Filled 2011-04-17: qty 1

## 2011-04-17 MED ORDER — ALBUTEROL SULFATE (5 MG/ML) 0.5% IN NEBU: 2.5000 mg | INHALATION_SOLUTION | RESPIRATORY_TRACT | Status: DC | PRN

## 2011-04-17 MED ORDER — SODIUM CHLORIDE 0.9 % IV SOLN
INTRAVENOUS | Status: DC
  Administered 2011-04-17: 13:00:00 via INTRAVENOUS

## 2011-04-17 MED ORDER — HYDROXYUREA 500 MG PO CAPS
500.0000 mg | ORAL_CAPSULE | Freq: Two times a day (BID) | ORAL | Status: DC
  Administered 2011-04-17 – 2011-04-29 (×24): 500 mg via ORAL
  Filled 2011-04-17 (×25): qty 1

## 2011-04-17 NOTE — H&P (Addendum)
[name] CSN:[id number],MRN:[id number]  Outpatient Primary MD for the patient is Gerald Cashing, MD, MD  With History of -  Past Medical History  Diagnosis Date  . Sickle cell anemia   . Blood transfusion   . 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 Hypokalemia   . Mood disorder   . Pulmonary embolism   . Avascular necrosis   . Leukocytosis     Chronic  . Thrombocytosis     Chronic      Past Surgical History  Procedure Date  . Right hip replacement     08/2006  . Cholecystectomy     01/2008  . Porta cath placement   . Porta cath removal   . Umbilical hernia repair     01/2008  . Excision of left periauricular cyst     10/2009  . Excision of right ear lobe cyst with primary closur     11/2007    in for   Chief Complaint  Patient presents with  . Sickle Cell Pain Crisis     HPI  [name]  is a 31 y.o. male, with history of sickle cell disease who has had multiple admissions for sickle cell crisis comes in with an episode of bilateral rib cage pain in the flank area along with bilateral lower extremity pain, his rib cage pain is nonradiating and constant, he relates that at 7-8/10, it is sharp in nature, no aggravating or relieving factors, denies any cough he has no shortness of breath whatsoever, denies any fever chills denies any diarrhea, denies any exposure to sick contacts.    Review of Systems    In addition to the HPI above,  No Fever-chills, No Headache, No changes with Vision or hearing, No problems swallowing food or Liquids, No Chest pain, Cough or Shortness of Breath, positive rib cage pain in the flank area, No Abdominal pain, No Nausea or Vommitting, Bowel movements are regular, No Blood in stool or Urine, No dysuria, No new skin rashes or bruises, Positive bilateral lower extremity pain  No new weakness, tingling, numbness in any extremity, No recent weight gain or loss, No polyuria, polydypsia or  polyphagia, No significant Mental Stressors.  A full 10 point Review of Systems was done, except as stated above, all other Review of Systems were negative.   Social History History  Substance Use Topics  . Smoking status: Former Smoker -- 13 years    Types: Cigarettes    Quit date: 07/08/2010  . Smokeless tobacco: Never Used  . Alcohol Use: No     Family History Family History  Problem Relation Age of Onset  . Sickle cell anemia Mother   . Sickle cell anemia Father   . Sickle cell trait Brother      Prior to Admission medications   Medication Sig Start Date End Date Taking? Authorizing Provider  folic acid (FOLVITE) 1 MG tablet Take 1 tablet (1 mg total) by mouth daily. 03/11/11 03/10/12 Yes Keitha Butte, NP  HYDROcodone-acetaminophen (VICODIN) 5-500 MG per tablet Take 1-2 tablets by mouth every 6 (six) hours as needed for pain. 04/15/11 04/25/11 Yes Raeford Razor, MD  HYDROmorphone (DILAUDID) 4 MG tablet Take 1 tablet (4 mg total) by mouth every 4 (four) hours as needed. For pain 03/11/11  Yes Keitha Butte, NP  hydroxyurea (HYDREA) 500 MG capsule Take 500 mg by mouth 2 (two) times daily. May take with food to minimize GI side effects.  Yes Keitha Butte, NP  ibuprofen (ADVIL,MOTRIN) 800 MG tablet Take 800 mg by mouth every 8 (eight) hours as needed. For pain.   Yes Historical Provider, MD  OLANZapine (ZYPREXA) 10 MG tablet Take 10 mg by mouth at bedtime.   Yes Keitha Butte, NP  oxyCODONE (OXYCONTIN) 80 MG 12 hr tablet Take 1 tablet (80 mg total) by mouth every 12 (twelve) hours. pain 03/11/11  Yes Keitha Butte, NP  potassium chloride SA (K-DUR,KLOR-CON) 20 MEQ tablet Take 1 tablet (20 mEq total) by mouth daily. 03/11/11  Yes Keitha Butte, NP  zolpidem (AMBIEN) 10 MG tablet Take 1 tablet (10 mg total) by mouth at bedtime as needed. For sleep. 03/11/11  Yes Keitha Butte, NP    Allergies  Allergen Reactions  . Morphine And Related Hives and  Rash    Physical Exam  Vitals  Blood pressure 118/75, pulse 59, temperature 98.4 F (36.9 C), temperature source Oral, resp. rate 16, SpO2 100.00%.   1. General Young African American male lying in bed in some pain,  2. Normal affect and insight, Not Suicidal or Homicidal, Awake Alert, Oriented *3.  3. No F.N deficits, ALL C.Nerves Intact, Strength 5/5 all 4 extremities, Sensation intact all 4 extremities, Plantars down going.  4. Ears and Eyes appear Normal, Conjunctivae clear, PERRLA. Moist Oral Mucosa.  5. Supple Neck, No JVD, No cervical lymphadenopathy appriciated, No Carotid Bruits.  6. Symmetrical Chest wall movement, Good air movement bilaterally, CTAB.  7. RRR, No Gallops, Rubs or Murmurs, No Parasternal Heave.  8. Positive Bowel Sounds, Abdomen Soft, Non tender, No organomegaly appriciated,       No rebound -guarding or rigidity.  9.  No Cyanosis, Normal Skin Turgor, No Skin Rash or Bruise.  10. Good muscle tone,  joints appear normal , no effusions, Normal ROM.  11. No Palpable Lymph Nodes in Neck or Axillae     Data Review  CBC  Lab 04/17/11 1150 04/15/11 0900 04/13/11 0722  WBC 25.4* 17.7* 19.8*  HGB 8.1* 9.2* 9.8*  HCT 23.5* 26.8* 29.0*  PLT 611* 695* 742*  MCV 89.0 89.3 89.8  MCH 30.7 30.7 30.3  MCHC 34.5 34.3 33.8  RDW 17.8* 17.1* 17.4*  LYMPHSABS 4.3* -- --  MONOABS 2.5* -- --  EOSABS 0.3 -- --  BASOSABS 0.3* -- --  BANDABS -- -- --   ------------------------------------------------------------------------------------------------------------------ Chemistries   Lab 04/17/11 1150 04/13/11 0722  NA 137 137  K 3.1* 3.0*  CL 109 104  CO2 20 22  GLUCOSE 93 102*  BUN 3* 3*  CREATININE 0.50 0.52  CALCIUM 8.4 9.5  MG -- --  AST -- 19  ALT -- 14  ALKPHOS -- 108  BILITOT -- 2.7*   ------------------------------------------------------------------------------------------------------------------ CrCl is unknown because both a height and  weight (above a minimum accepted value) are required for this calculation. ------------------------------------------------------------------------------------------------------------------ No results found for this basename: TSH,T4TOTAL,FREET3,T3FREE,THYROIDAB in the last 72 hours  Coagulation profile No results found for this basename: INR:5,PROTIME:5 in the last 168 hours ------------------------------------------------------------------------------------------------------------------- No results found for this basename: DDIMER:2 in the last 72 hours ------------------------------------------------------------------------------------------------------------------- Cardiac Enzymes No results found for this basename: CK:3,CKMB:3,TROPONINI:3,MYOGLOBIN:3 in the last 168 hours ------------------------------------------------------------------------------------------------------------------ No components found with this basename: POCBNP:3 ------------------------------------------------------------------------------------------------------------------  Imaging results:   Dg Chest 2 View  04/17/2011  *RADIOLOGY REPORT*  Clinical Data: Sickle cell crisis  CHEST - 2 VIEW  Comparison: Chest radiograph 04/13/2011  Findings: Stable cardiac silhouette.  There is no focal  consolidation.  No pleural fluid.  There is a chronic bronchitic markings centrally unchanged from prior.  No osseous abnormality. Mild atelectasis at the right lung base is not changed.  IMPRESSION:  1.  No interval change. 2.  No evidence of pneumonia. 3.  chronic bronchitic change in atelectasis.  Original Report Authenticated By: Genevive Bi, M.D.   Dg Chest 2 View  04/13/2011  *RADIOLOGY REPORT*  Clinical Data: History of cough.  Sickle cell disease.  CHEST - 2 VIEW  Comparison: Multiple priors, most recently chest x-ray 03/05/2011.  Findings: Lung volumes are normal.  No consolidative airspace disease.  No pleural effusions.   Mild diffuse prominence of the interstitial markings is similar compared to prior examinations. Heart size is normal.  Mediastinal contours are within normal limits.  Surgical clips projecting over the right upper quadrant of the abdomen, consistent with prior cholecystectomy.  IMPRESSION: 1.  No radiographic evidence of acute cardiopulmonary disease. 2.  Mild diffuse interstitial prominence is similar compared to numerous prior examinations and likely reflects areas of chronic parenchymal scarring (better demonstrated on prior chest CT dated 12/16/2010). 3.  Status post cholecystectomy.  Original Report Authenticated By: Florencia Reasons, M.D.    My personal review of EKG: Rhythm NSR, Rate  63 /min, QTc 422 , old t wave inversions Ant leads     Assessment & Plan  1. Sickle cell crisis - apparently patient has been getting sick quite frequently now, we will admit him to telemetry bed, he does not have any shortness of breath and therefore I do not think this is a chest crisis, he had a CT and you few months ago negative for PE and I do not see another reason to expose him to radiation and IV dye, he is not requiring any oxygen and he is 100% on room air, although 2 L nasal cannula oxygen will be applied for sickle cell crisis.  We will give him IV fluids, IV pain medications, peripheral smear will be ordered, folic acid and Hydrea will be continued, I will type and screen blood for him in case he needs exchange transfusion, I have discussed the case with Dr. Arbutus Ped hematologist on call who agrees with the plan and will review patient's peripheral smear and give further advice.  Care will be possibly assumed by Dr. Willey Blade in the morning tomorrow was not available today.    2. Hypokalemia -potassium will be replaced.   3. History of smoking quit several months ago was encouraged to continue abstaining.   4. Leukocytosis with thrombocytosis - reactionary due to #1 above treatment as  above.D/W Dr Gwenyth Bouillon who recommends empiric ABX as WBC high, will do blood cultures, of note no fevers.    DVT Prophylaxis    Lovenox    AM Labs Ordered, also please review Full Orders  Admission, patients condition and plan of care including tests being ordered have been discussed with the patient   who indicates understanding and agree with the plan and Code Status.  Code Status Full  Condition fair  Leroy Sea M.D on 04/17/2011 at 1:30 PM  [facility] Office  [phone]

## 2011-04-17 NOTE — ED Notes (Signed)
IV team was unable to pull any blood off the line that they put in his foot.

## 2011-04-17 NOTE — ED Provider Notes (Signed)
History     CSN: [id number]  Arrival date & time 04/17/11  0603   First MD Initiated Contact with Patient 04/17/11 [phone]      Chief Complaint  Patient presents with  . Sickle Cell Pain Crisis    (Consider location/radiation/quality/duration/timing/severity/associated sxs/prior treatment) HPI Comments: Patient with history of sickle cell disease, seen recently in emergency department and discharged, presents with return of right lateral rib cage pain. Patient denies shortness of breath, chest pain, cough, or fever. Patient says this pain is typical for him. Patient states he woke from sleep approximately one hour ago with severe pain. No treatments prior to arrival. Nothing makes the pain better or worse. Patient reports pain is 10/10.  The history is provided by the patient.    Past Medical History  Diagnosis Date  . Sickle cell anemia   . Blood transfusion   . 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 Hypokalemia   . Mood disorder   . Pulmonary embolism   . Avascular necrosis   . Leukocytosis     Chronic  . Thrombocytosis     Chronic    Past Surgical History  Procedure Date  . Right hip replacement     08/2006  . Cholecystectomy     01/2008  . Porta cath placement   . Porta cath removal   . Umbilical hernia repair     01/2008  . Excision of left periauricular cyst     10/2009  . Excision of right ear lobe cyst with primary closur     11/2007    Family History  Problem Relation Age of Onset  . Sickle cell anemia Mother   . Sickle cell anemia Father   . Sickle cell trait Brother     History  Substance Use Topics  . Smoking status: Former Smoker -- 13 years    Types: Cigarettes    Quit date: 07/08/2010  . Smokeless tobacco: Never Used  . Alcohol Use: No      Review of Systems  Constitutional: Negative for fever.  HENT: Negative for sore throat and rhinorrhea.   Eyes: Negative for redness.  Respiratory: Negative for cough and  shortness of breath.   Cardiovascular: Negative for leg swelling.       Positive for right lateral rib pain  Gastrointestinal: Negative for nausea, vomiting, abdominal pain and diarrhea.  Genitourinary: Negative for dysuria.  Musculoskeletal: Positive for arthralgias. Negative for myalgias.  Skin: Negative for rash.  Neurological: Negative for headaches.    Allergies  Morphine and related  Home Medications   Current Outpatient Rx  Name Route Sig Dispense Refill  . FOLIC ACID 1 MG PO TABS Oral Take 1 tablet (1 mg total) by mouth daily. 60 tablet 2  . HYDROCODONE-ACETAMINOPHEN 5-500 MG PO TABS Oral Take 1-2 tablets by mouth every 6 (six) hours as needed for pain. 20 tablet 0  . HYDROMORPHONE HCL 4 MG PO TABS Oral Take 1 tablet (4 mg total) by mouth every 4 (four) hours as needed. For pain 60 tablet 0  . HYDROXYUREA 500 MG PO CAPS Oral Take 500 mg by mouth 2 (two) times daily. May take with food to minimize GI side effects.    . IBUPROFEN 800 MG PO TABS Oral Take 800 mg by mouth every 8 (eight) hours as needed. For pain.    Marland Kitchen OLANZAPINE 10 MG PO TABS Oral Take 10 mg by mouth at bedtime.    . OXYCODONE  HCL ER 80 MG PO TB12 Oral Take 1 tablet (80 mg total) by mouth every 12 (twelve) hours. pain 40 tablet 0  . POTASSIUM CHLORIDE CRYS ER 20 MEQ PO TBCR Oral Take 1 tablet (20 mEq total) by mouth daily. 30 tablet 1  . ZOLPIDEM TARTRATE 10 MG PO TABS Oral Take 1 tablet (10 mg total) by mouth at bedtime as needed. For sleep. 30 tablet 0    BP 158/91  Pulse 86  Temp 97.9 F (36.6 C)  Resp 18  SpO2 100%  Physical Exam  Nursing note and vitals reviewed. Constitutional: He is oriented to person, place, and time. He appears well-developed and well-nourished.  HENT:  Head: Normocephalic and atraumatic.  Eyes: Conjunctivae are normal. Right eye exhibits no discharge. Left eye exhibits no discharge.  Neck: Normal range of motion. Neck supple.  Cardiovascular: Normal rate, regular rhythm and  normal heart sounds.   Pulmonary/Chest: Effort normal and breath sounds normal. No respiratory distress. He has no wheezes. He has no rales.       Normal expansion, no skin signs of trauma over R lateral ribs in area of pain.   Abdominal: Soft. Bowel sounds are normal. There is no tenderness. There is no rebound and no guarding.  Neurological: He is alert and oriented to person, place, and time.  Skin: Skin is warm and dry.  Psychiatric: He has a normal mood and affect.    ED Course  Procedures (including critical care time)  Labs Reviewed  CBC - Abnormal; Notable for the following:    WBC 25.4 (*)    RBC 2.64 (*)    Hemoglobin 8.1 (*)    HCT 23.5 (*)    RDW 17.8 (*)    Platelets 611 (*)    All other components within normal limits  DIFFERENTIAL - Abnormal; Notable for the following:    Neutro Abs 18.0 (*)    Lymphs Abs 4.3 (*)    Monocytes Absolute 2.5 (*)    Basophils Absolute 0.3 (*)    All other components within normal limits  BASIC METABOLIC PANEL - Abnormal; Notable for the following:    Potassium 3.1 (*)    BUN 3 (*)    All other components within normal limits  RETICULOCYTES - Abnormal; Notable for the following:    Retic Ct Pct 10.3 (*)    RBC. 2.64 (*)    Retic Count, Manual 271.9 (*)    All other components within normal limits  URINALYSIS, ROUTINE W REFLEX MICROSCOPIC   Dg Chest 2 View  04/17/2011  *RADIOLOGY REPORT*  Clinical Data: Sickle cell crisis  CHEST - 2 VIEW  Comparison: Chest radiograph 04/13/2011  Findings: Stable cardiac silhouette.  There is no focal consolidation.  No pleural fluid.  There is a chronic bronchitic markings centrally unchanged from prior.  No osseous abnormality. Mild atelectasis at the right lung base is not changed.  IMPRESSION:  1.  No interval change. 2.  No evidence of pneumonia. 3.  chronic bronchitic change in atelectasis.  Original Report Authenticated By: Genevive Bi, M.D.     1. Vaso-occlusive sickle cell crisis      6:30 AM Patient seen and examined. Work-up initiated. Medications ordered. Previous ED visit note reviewed. Do not suspect acute chest, however x-ray ordered due to right lateral rib pain.   Vital signs reviewed and are as follows: Filed Vitals:   04/17/11 0607  BP: 158/91  Pulse: 86  Temp: 97.9 F (36.6 C)  Resp:  18   7:14 AM IV not yet started. IM dilaudid ordered.   9:12 AM IV in foot. Fluids going. Pt received 2mg  dilaudid 5 mins ago. Will continue to reassess. Pain 7/10.   11:47 AM Patient continues to have significant pain, appears uncomfortable after 8mg  IV dilaudid. Will admit. Will ask for PICC to be placed. Dr. Weldon Inches will see patient.   MDM  Admit for vaso-occlusive pain crisis. No concern for acute chest syndrome or PNA.         Renne Crigler, [place] 04/17/11 1247  Renne Crigler, [place] 04/17/11 1253  George West, [place] 04/17/11 1546

## 2011-04-17 NOTE — ED Notes (Addendum)
Pt alert and oriented x4. Respirations even and unlabored, bilateral symmetrical rise and fall of chest. Skin warm and dry. In no acute distress. Denies needs. Pt states pain stared an hr ago, pain mostly to the side and the back. Pain level 10/10

## 2011-04-17 NOTE — ED Notes (Signed)
IV team paged.  

## 2011-04-17 NOTE — ED Notes (Signed)
IV team repaged.  Will be here in 15 minutes.

## 2011-04-17 NOTE — ED Notes (Signed)
After several attempts and 2 nurses, still unable to obtain IV access.

## 2011-04-17 NOTE — ED Notes (Signed)
IV team called and said they are unable to come immediately and will be coming by this afternoon to do the PICC.

## 2011-04-17 NOTE — ED Notes (Signed)
When attempting to restart fluids after giving medications, the pt's IV blew.

## 2011-04-18 DIAGNOSIS — F29 Unspecified psychosis not due to a substance or known physiological condition: Secondary | ICD-10-CM | POA: Diagnosis not present

## 2011-04-18 DIAGNOSIS — D5700 Hb-SS disease with crisis, unspecified: Secondary | ICD-10-CM | POA: Diagnosis not present

## 2011-04-18 DIAGNOSIS — D571 Sickle-cell disease without crisis: Secondary | ICD-10-CM | POA: Diagnosis not present

## 2011-04-18 DIAGNOSIS — IMO0002 Reserved for concepts with insufficient information to code with codable children: Secondary | ICD-10-CM | POA: Diagnosis not present

## 2011-04-18 DIAGNOSIS — I471 Supraventricular tachycardia: Secondary | ICD-10-CM | POA: Diagnosis not present

## 2011-04-18 DIAGNOSIS — G9341 Metabolic encephalopathy: Secondary | ICD-10-CM | POA: Diagnosis not present

## 2011-04-18 DIAGNOSIS — D5701 Hb-SS disease with acute chest syndrome: Secondary | ICD-10-CM | POA: Diagnosis not present

## 2011-04-18 LAB — BASIC METABOLIC PANEL
Calcium: 8.6 mg/dL (ref 8.4–10.5)
GFR calc Af Amer: >90 mL/min (ref >90)
GFR calc non Af Amer: >90 mL/min (ref >90)
Glucose, Bld: 95 mg/dL (ref 70–99)
Potassium: 3.4 mEq/L — ABNORMAL LOW (ref 3.5–5.1)
Sodium: 137 mEq/L (ref 135–145)

## 2011-04-18 LAB — CBC
Hemoglobin: 7.5 g/dL — ABNORMAL LOW (ref 13.0–17.0)
MCH: 30.6 pg (ref 26.0–34.0)
Platelets: 622 10*3/uL — ABNORMAL HIGH (ref 150–400)
RBC: 2.45 MIL/uL — ABNORMAL LOW (ref 4.22–5.81)
WBC: 23.9 10*3/uL — ABNORMAL HIGH (ref 4.0–10.5)

## 2011-04-18 LAB — PROTIME-INR
INR: 1.23 (ref 0.00–1.49)
Prothrombin Time: 15.8 seconds — ABNORMAL HIGH (ref 11.6–15.2)

## 2011-04-18 LAB — PREPARE RBC (CROSSMATCH)

## 2011-04-18 MED ORDER — HYDROMORPHONE HCL PF 2 MG/ML IJ SOLN
2.0000 mg | INTRAMUSCULAR | Status: DC
  Administered 2011-04-18: 2 mg via INTRAVENOUS
  Administered 2011-04-18 – 2011-04-19 (×7): 4 mg via INTRAVENOUS
  Administered 2011-04-19: 2 mg via INTRAVENOUS
  Administered 2011-04-19 – 2011-04-21 (×20): 4 mg via INTRAVENOUS
  Administered 2011-04-21: 2 mg via INTRAVENOUS
  Administered 2011-04-21: 4 mg via INTRAVENOUS
  Administered 2011-04-21 (×2): 2 mg via INTRAVENOUS
  Administered 2011-04-21 (×5): 4 mg via INTRAVENOUS
  Administered 2011-04-22: 2 mg via INTRAVENOUS
  Administered 2011-04-22 (×2): 4 mg via INTRAVENOUS
  Administered 2011-04-22: 2 mg via INTRAVENOUS
  Administered 2011-04-22 (×8): 4 mg via INTRAVENOUS
  Administered 2011-04-23: 2 mg via INTRAVENOUS
  Administered 2011-04-23 (×3): 4 mg via INTRAVENOUS
  Administered 2011-04-23: 2 mg via INTRAVENOUS
  Administered 2011-04-23: 4 mg via INTRAVENOUS
  Administered 2011-04-23: 2 mg via INTRAVENOUS
  Administered 2011-04-23: 4 mg via INTRAVENOUS
  Administered 2011-04-23 – 2011-04-24 (×4): 2 mg via INTRAVENOUS
  Administered 2011-04-24: 4 mg via INTRAVENOUS
  Administered 2011-04-24: 2 mg via INTRAVENOUS
  Administered 2011-04-24: 4 mg via INTRAVENOUS
  Administered 2011-04-24: 2 mg via INTRAVENOUS
  Administered 2011-04-24: 4 mg via INTRAVENOUS
  Administered 2011-04-24: 2 mg via INTRAVENOUS
  Administered 2011-04-24 – 2011-04-25 (×8): 4 mg via INTRAVENOUS
  Filled 2011-04-18 (×4): qty 2
  Filled 2011-04-18: qty 1
  Filled 2011-04-18 (×3): qty 2
  Filled 2011-04-18 (×2): qty 1
  Filled 2011-04-18 (×5): qty 2
  Filled 2011-04-18: qty 1
  Filled 2011-04-18 (×2): qty 2
  Filled 2011-04-18: qty 1
  Filled 2011-04-18 (×3): qty 2
  Filled 2011-04-18 (×2): qty 1
  Filled 2011-04-18 (×11): qty 2
  Filled 2011-04-18: qty 1
  Filled 2011-04-18 (×8): qty 2
  Filled 2011-04-18: qty 1
  Filled 2011-04-18: qty 2
  Filled 2011-04-18: qty 1
  Filled 2011-04-18: qty 2
  Filled 2011-04-18: qty 1
  Filled 2011-04-18: qty 2
  Filled 2011-04-18: qty 1
  Filled 2011-04-18 (×2): qty 2
  Filled 2011-04-18 (×3): qty 1
  Filled 2011-04-18 (×4): qty 2
  Filled 2011-04-18: qty 1
  Filled 2011-04-18: qty 2
  Filled 2011-04-18: qty 1
  Filled 2011-04-18: qty 2
  Filled 2011-04-18: qty 1
  Filled 2011-04-18 (×7): qty 2
  Filled 2011-04-18: qty 1
  Filled 2011-04-18 (×7): qty 2

## 2011-04-18 MED ORDER — ONDANSETRON HCL 4 MG/2ML IJ SOLN
4.0000 mg | INTRAMUSCULAR | Status: DC | PRN
  Administered 2011-04-18 – 2011-04-21 (×15): 4 mg via INTRAVENOUS
  Filled 2011-04-18 (×14): qty 2

## 2011-04-18 MED ORDER — DIPHENHYDRAMINE HCL 50 MG/ML IJ SOLN
25.0000 mg | INTRAMUSCULAR | Status: DC | PRN
  Administered 2011-04-18 – 2011-04-29 (×51): 25 mg via INTRAVENOUS
  Filled 2011-04-18 (×51): qty 1

## 2011-04-18 MED ORDER — POTASSIUM CHLORIDE CRYS ER 10 MEQ PO TBCR
10.0000 meq | EXTENDED_RELEASE_TABLET | Freq: Two times a day (BID) | ORAL | Status: DC
  Administered 2011-04-18 – 2011-04-29 (×22): 10 meq via ORAL
  Filled 2011-04-18 (×23): qty 1

## 2011-04-18 MED ORDER — ONDANSETRON HCL 4 MG PO TABS: 4.0000 mg | ORAL_TABLET | ORAL | Status: DC | PRN

## 2011-04-18 MED ORDER — HYDROMORPHONE HCL PF 2 MG/ML IJ SOLN
2.0000 mg | INTRAMUSCULAR | Status: DC | PRN
  Administered 2011-04-18 (×7): 4 mg via INTRAVENOUS
  Filled 2011-04-18 (×7): qty 2

## 2011-04-18 MED ORDER — MUPIROCIN 2 % EX OINT
TOPICAL_OINTMENT | Freq: Two times a day (BID) | CUTANEOUS | Status: AC
  Administered 2011-04-20 – 2011-04-23 (×6): via NASAL
  Filled 2011-04-18: qty 22

## 2011-04-18 MED ORDER — KCL IN DEXTROSE-NACL 20-5-0.45 MEQ/L-%-% IV SOLN
INTRAVENOUS | Status: DC
  Administered 2011-04-18: 75 mL/h via INTRAVENOUS
  Administered 2011-04-19 – 2011-04-28 (×9): via INTRAVENOUS
  Filled 2011-04-18 (×17): qty 1000

## 2011-04-18 MED ORDER — LIDOCAINE 5 % EX PTCH
1.0000 | MEDICATED_PATCH | CUTANEOUS | Status: DC
  Administered 2011-04-18 – 2011-04-19 (×2): 1 via TRANSDERMAL
  Filled 2011-04-18 (×4): qty 1

## 2011-04-18 MED ORDER — POLYETHYLENE GLYCOL 3350 17 G PO PACK
17.0000 g | PACK | Freq: Every day | ORAL | Status: DC
  Administered 2011-04-18 – 2011-04-21 (×2): 17 g via ORAL
  Filled 2011-04-18 (×4): qty 1

## 2011-04-18 NOTE — Plan of Care (Signed)
Problem: Phase I Progression Outcomes Goal: Initial discharge plan identified Outcome: Completed/Met Date Met:  04/18/11 Pt to return Home

## 2011-04-18 NOTE — Progress Notes (Signed)
Subjective: The patient was seen on rounds today.  The patient was sitting quietly in his bed watching television.  The patient rates his pain as 8/10 today in his bilateral flank area that radiates to his back.   The patient stated that he had some chest and lower extremity discomfort on admission, but these pains have improved.  The patient denies any other symptomatology.  No other nursing or patient concerns.   Objective: Vital signs in last 24 hours: Blood pressure 144/93, pulse 91, temperature 99 F (37.2 C), temperature source Oral, resp. rate 18, height 6' (1.829 m), weight 184 lb 15.5 oz (83.9 kg), SpO2 96.00%.  General Appearance: Alert, cooperative, well developed, well nourished, moderate distress Head: Normocephalic, without obvious abnormality, atraumatic Eyes: PERRLA, EOMI, the patient has slight strabismus, scleral icterus Nose: Nares normal, septum midline, mucosa normal, no drainage or sinus tenderness. Throat:  Normal lips, mucosa, tongue and gums Back: Symmetric, bilateral CVA/flank tenderness, diffuse tenderness in back Resp: CTA, Diminished bibasilar breath sounds, no wheezes/rales/rhonchi Cardio: S1, S2 regular, no murmurs/clicks/rubs/gallops GI: Soft, non-tender, normoactive bowel sounds, no masses, no organomegaly Male Genitalia: Deferred, the patient denies priapism Extremities: Extremities normal, atraumatic, no cyanosis or edema, negative Homans sign, no sign of DVT Pulses: 2+ and symmetric Skin: Skin color, texture, turgor normal, no rashes or lesions, numerous tattoos Neurologic: Grossly normal, CN II-XII intact, no focal deficits Psych:  Appropriate affect  Lab Results: Results for orders placed during the hospital encounter of 04/17/11 (from the past 24 hour(s))  BASIC METABOLIC PANEL     Status: Abnormal   Collection Time   04/18/11  4:45 AM      Component Value Range   Sodium 137  135 - 145 (mEq/L)   Potassium 3.4 (*) 3.5 - 5.1 (mEq/L)   Chloride 107   96 - 112 (mEq/L)   CO2 19  19 - 32 (mEq/L)   Glucose, Bld 95  70 - 99 (mg/dL)   BUN 3 (*) 6 - 23 (mg/dL)   Creatinine, Ser 1.61  0.50 - 1.35 (mg/dL)   Calcium 8.6  8.4 - 09.6 (mg/dL)   GFR calc non Af Amer >90  >90 (mL/min)   GFR calc Af Amer >90  >90 (mL/min)  CBC     Status: Abnormal   Collection Time   04/18/11  4:45 AM      Component Value Range   WBC 23.9 (*) 4.0 - 10.5 (K/uL)   RBC 2.45 (*) 4.22 - 5.81 (MIL/uL)   Hemoglobin 7.5 (*) 13.0 - 17.0 (g/dL)   HCT 04.5 (*) 40.9 - 52.0 (%)   MCV 89.8  78.0 - 100.0 (fL)   MCH 30.6  26.0 - 34.0 (pg)   MCHC 34.1  30.0 - 36.0 (g/dL)   RDW 81.1 (*) 91.4 - 15.5 (%)   Platelets 622 (*) 150 - 400 (K/uL)  PROTIME-INR     Status: Abnormal   Collection Time   04/18/11  4:45 AM      Component Value Range   Prothrombin Time 15.8 (*) 11.6 - 15.2 (seconds)   INR 1.23  0.00 - 1.49   PREPARE RBC (CROSSMATCH)     Status: Normal   Collection Time   04/18/11  4:30 PM      Component Value Range   Order Confirmation ORDER PROCESSED BY BLOOD BANK      Studies/Results: Dg Chest 2 View  04/17/2011  *RADIOLOGY REPORT*  Clinical Data: Sickle cell crisis  CHEST - 2 VIEW  Comparison: Chest radiograph 04/13/2011  Findings: Stable cardiac silhouette.  There is no focal consolidation.  No pleural fluid.  There is a chronic bronchitic markings centrally unchanged from prior.  No osseous abnormality. Mild atelectasis at the right lung base is not changed.  IMPRESSION:  1.  No interval change. 2.  No evidence of pneumonia. 3.  chronic bronchitic change in atelectasis.  Original Report Authenticated By: Genevive Bi, M.D.   Dg Chest Port 1 View  04/17/2011  *RADIOLOGY REPORT*  Clinical Data: PICC line placement  PORTABLE CHEST - 1 VIEW  Comparison: Plain films 04/17/2011  Findings: Interval placement right PICC line tip in distal SVC. Heart is mildly enlarged.  There is mild central venous congestion. No focal consolidation.  No pneumothorax.  IMPRESSION: Interval  placement right PICC line good position.  Central venous congestion.  Original Report Authenticated By: Genevive Bi, M.D.    Medications: I have reviewed the patient's current medications.  Allergies  Allergen Reactions  . Morphine And Related Hives and Rash     Current Facility-Administered Medications  Medication Dose Route Frequency Provider Last Rate Last Dose  . albuterol (PROVENTIL) (5 MG/ML) 0.5% nebulizer solution 2.5 mg  2.5 mg Nebulization Q2H PRN Leroy Sea, MD      . cefTRIAXone (ROCEPHIN) 1 g in dextrose 5 % 50 mL IVPB  1 g Intravenous Q24H Leroy Sea, MD   1 g at 04/18/11 1553  . dextrose 5 % and 0.45 % NaCl with KCl 20 mEq/L infusion   Intravenous Continuous Keitha Butte, NP      . diphenhydrAMINE (BENADRYL) injection 25 mg  25 mg Intravenous Q4H PRN Keitha Butte, NP      . enoxaparin (LOVENOX) injection 40 mg  40 mg Subcutaneous Q24H Leroy Sea, MD   40 mg at 04/18/11 1800  . folic acid (FOLVITE) tablet 1 mg  1 mg Oral Daily Leroy Sea, MD   1 mg at 04/18/11 4098  . guaiFENesin-dextromethorphan (ROBITUSSIN DM) 100-10 MG/5ML syrup 5 mL  5 mL Oral Q4H PRN Leroy Sea, MD      . HYDROmorphone (DILAUDID) injection 2-4 mg  2-4 mg Intravenous Q2H PRN Jinger Neighbors, NP   4 mg at 04/18/11 0048  . HYDROmorphone (DILAUDID) injection 2-4 mg  2-4 mg Intravenous Q2H Keitha Butte, NP   4 mg at 04/18/11 1800  . hydroxyurea (HYDREA) capsule 500 mg  500 mg Oral BID Leroy Sea, MD   500 mg at 04/18/11 0937  . ibuprofen (ADVIL,MOTRIN) tablet 800 mg  800 mg Oral Q6H PRN Leroy Sea, MD      . lidocaine (LIDODERM) 5 % 1 patch  1 patch Transdermal Q24H Keitha Butte, NP      . mupirocin ointment (BACTROBAN) 2 %   Nasal BID Keitha Butte, NP      . OLANZapine (ZYPREXA) tablet 10 mg  10 mg Oral QHS Leroy Sea, MD   10 mg at 04/17/11 2211  . ondansetron (ZOFRAN) tablet 4 mg  4 mg Oral Q4H PRN Keitha Butte, NP        Or  . ondansetron (ZOFRAN) injection 4 mg  4 mg Intravenous Q4H PRN Keitha Butte, NP      . oxyCODONE (OXYCONTIN) 12 hr tablet 80 mg  80 mg Oral Q12H Leroy Sea, MD   80 mg at 04/18/11 0936  . polyethylene glycol (MIRALAX / GLYCOLAX) packet 17 g  17 g Oral Daily Keitha Butte, NP      . potassium chloride (K-DUR,KLOR-CON) CR tablet 10 mEq  10 mEq Oral BID Keitha Butte, NP      . sodium chloride 0.9 % injection 10-40 mL  10-40 mL Intracatheter Q12H Cheri Guppy, MD      . sodium chloride 0.9 % injection 10-40 mL  10-40 mL Intracatheter PRN Cheri Guppy, MD      . sodium chloride 0.9 % injection 3 mL  3 mL Intravenous Q12H Leroy Sea, MD   3 mL at 04/18/11 0937  . zolpidem (AMBIEN) tablet 5 mg  5 mg Oral QHS PRN Jinger Neighbors, NP   5 mg at 04/17/11 2243  . DISCONTD: 0.9 %  sodium chloride infusion   Intravenous Continuous Leroy Sea, MD 125 mL/hr at 04/18/11 1524    . DISCONTD: diphenhydrAMINE (BENADRYL) injection 25 mg  25 mg Intravenous Q6H PRN Leroy Sea, MD   25 mg at 04/18/11 1544  . DISCONTD: HYDROmorphone (DILAUDID) injection 2 mg  2 mg Intravenous Q2H PRN Renne Crigler, PA   2 mg at 04/17/11 2208  . DISCONTD: HYDROmorphone (DILAUDID) injection 2-4 mg  2-4 mg Intravenous Q2H PRN Jinger Neighbors, NP   4 mg at 04/18/11 1546  . DISCONTD: ondansetron (ZOFRAN) injection 4 mg  4 mg Intravenous Q6H PRN Leroy Sea, MD      . DISCONTD: ondansetron (ZOFRAN) tablet 4 mg  4 mg Oral Q6H PRN Leroy Sea, MD      . DISCONTD: potassium chloride SA (K-DUR,KLOR-CON) CR tablet 40 mEq  40 mEq Oral BID Leroy Sea, MD   40 mEq at 04/18/11 5621    Assessment/Plan: Patient Active Problem List  Diagnoses  . Sickle cell pain crisis  . Leukocytosis  . Chronic pain  . Hypokalemia  . Thrombocytosis    Sickle Cell Crisis/Chronic Pain:  The patient will receive a blood transfusion as soon as the blood is received. The patient may receive a  blood exchange tomorrow.  An additional unit of PRBCs is asked to be crossmatched tomorrow am.  The patient will continue pain/nausea/pruritis/bowel management, IV hydration, home medications,  DVT prophylaxis. CMP, CBC, Ferritin, Mg, Phos, ProBNP and Hemoglobinopathy in the am. Leukocytosis/Thrombocytocis:  The patient is on IV Rocephin, IS usage is encouraged, the patient will also receive a blood transfusion once blood is received and a possible blood exchange tomorrow if blood is available.  Hypokalemia:  The patient is receiving PO potassium supplementation daily and IVFs with 20 mEq KCL - will continue to monitor    Larina Bras, NP-C 04/18/2011, 5:52 PM

## 2011-04-19 DIAGNOSIS — D571 Sickle-cell disease without crisis: Secondary | ICD-10-CM | POA: Diagnosis not present

## 2011-04-19 DIAGNOSIS — IMO0002 Reserved for concepts with insufficient information to code with codable children: Secondary | ICD-10-CM | POA: Diagnosis not present

## 2011-04-19 DIAGNOSIS — G9341 Metabolic encephalopathy: Secondary | ICD-10-CM | POA: Diagnosis not present

## 2011-04-19 DIAGNOSIS — F29 Unspecified psychosis not due to a substance or known physiological condition: Secondary | ICD-10-CM | POA: Diagnosis not present

## 2011-04-19 DIAGNOSIS — D5700 Hb-SS disease with crisis, unspecified: Secondary | ICD-10-CM | POA: Diagnosis not present

## 2011-04-19 DIAGNOSIS — I471 Supraventricular tachycardia: Secondary | ICD-10-CM | POA: Diagnosis not present

## 2011-04-19 DIAGNOSIS — D5701 Hb-SS disease with acute chest syndrome: Secondary | ICD-10-CM | POA: Diagnosis not present

## 2011-04-19 LAB — COMPREHENSIVE METABOLIC PANEL
AST: 22 U/L (ref 0–37)
CO2: 20 mEq/L (ref 19–32)
Calcium: 9 mg/dL (ref 8.4–10.5)
Chloride: 106 mEq/L (ref 96–112)
Creatinine, Ser: 0.59 mg/dL (ref 0.50–1.35)
GFR calc Af Amer: >90 mL/min (ref >90)
GFR calc non Af Amer: >90 mL/min (ref >90)
Glucose, Bld: 103 mg/dL — ABNORMAL HIGH (ref 70–99)
Total Bilirubin: 3.3 mg/dL — ABNORMAL HIGH (ref 0.3–1.2)

## 2011-04-19 LAB — CBC
HCT: 25.3 % — ABNORMAL LOW (ref 39.0–52.0)
Hemoglobin: 8.5 g/dL — ABNORMAL LOW (ref 13.0–17.0)
MCH: 29.8 pg (ref 26.0–34.0)
MCV: 88.8 fL (ref 78.0–100.0)
Platelets: 641 10*3/uL — ABNORMAL HIGH (ref 150–400)
RBC: 2.85 MIL/uL — ABNORMAL LOW (ref 4.22–5.81)
WBC: 23.5 10*3/uL — ABNORMAL HIGH (ref 4.0–10.5)

## 2011-04-19 LAB — PREPARE RBC (CROSSMATCH)

## 2011-04-19 LAB — PRO B NATRIURETIC PEPTIDE: Pro B Natriuretic peptide (BNP): 580.9 pg/mL — ABNORMAL HIGH (ref 0–125)

## 2011-04-19 LAB — CARDIAC PANEL(CRET KIN+CKTOT+MB+TROPI)
Relative Index: INVALID (ref 0.0–2.5)
Total CK: 76 U/L (ref 7–232)
Troponin I: <0.3 ng/mL (ref <0.30)

## 2011-04-19 MED ORDER — MOXIFLOXACIN HCL IN NACL 400 MG/250ML IV SOLN
400.0000 mg | INTRAVENOUS | Status: DC
  Administered 2011-04-19 – 2011-04-24 (×6): 400 mg via INTRAVENOUS
  Filled 2011-04-19 (×9): qty 250

## 2011-04-19 MED ORDER — KETOROLAC TROMETHAMINE 30 MG/ML IJ SOLN
30.0000 mg | Freq: Three times a day (TID) | INTRAMUSCULAR | Status: DC
  Administered 2011-04-19 – 2011-04-20 (×4): 30 mg via INTRAVENOUS
  Filled 2011-04-19 (×6): qty 1

## 2011-04-19 MED ORDER — DEXTROSE 5 % IV SOLN
2000.0000 mg | Freq: Every day | INTRAVENOUS | Status: AC
Start: 2011-04-19 — End: 2011-04-21
  Administered 2011-04-19 – 2011-04-20 (×2): 2000 mg via INTRAVENOUS
  Filled 2011-04-19 (×3): qty 2

## 2011-04-19 NOTE — Progress Notes (Signed)
Blood exchange:  300 ml blood drawn from picc line as ordered, patient tolerated procedure well, patient's RN notified

## 2011-04-19 NOTE — Progress Notes (Signed)
Subjective: The patient was seen on rounds today.  The patient was in visible distress due to pain.  The patient rates his pain as 8/10 today in his bilateral flank area that radiates to his back.   The patient also states that he has pain in his ankles.  The patient states that he has been having to call for his scheduled pain medication.  Discussed the patient's concerns with the patient's nurse.  No other nursing or patient concerns.   Objective: Vital signs in last 24 hours: Blood pressure 146/95, pulse 94, temperature 98.8 F (37.1 C), temperature source Oral, resp. rate 16, height 6' (1.829 m), weight 182 lb 8 oz (82.781 kg), SpO2 96.00%.  General Appearance: Alert, cooperative, well developed, well nourished, moderate distress Head: Normocephalic, without obvious abnormality, atraumatic Eyes: PERRLA, EOMI, the patient has slight strabismus, scleral icterus Nose: Nares normal, septum midline, mucosa normal, no drainage or sinus tenderness. Throat:  Normal lips, mucosa, tongue and gums Back: Symmetric, bilateral CVA/flank tenderness, diffuse tenderness in back Resp: CTA, Diminished bibasilar breath sounds, no wheezes/rales/rhonchi Cardio: S1, S2 regular, no murmurs/clicks/rubs/gallops GI: Soft, non-tender, normoactive bowel sounds, no masses, no organomegaly Male Genitalia: Deferred, the patient denies priapism Extremities: Extremities normal, atraumatic, no cyanosis or edema, negative Homans sign, no sign of DVT Pulses: 2+ and symmetric Skin: Skin color, texture, turgor normal, no rashes or lesions, numerous tattoos Neurologic: Grossly normal, CN II-XII intact, no focal deficits Psych:  Appropriate affect  Lab Results: Results for orders placed during the hospital encounter of 04/17/11 (from the past 24 hour(s))  MAGNESIUM     Status: Normal   Collection Time   04/19/11  5:55 AM      Component Value Range   Magnesium 1.7  1.5 - 2.5 (mg/dL)  PHOSPHORUS     Status: Normal   Collection Time   04/19/11  5:55 AM      Component Value Range   Phosphorus 3.3  2.3 - 4.6 (mg/dL)  CBC     Status: Abnormal   Collection Time   04/19/11  5:55 AM      Component Value Range   WBC 23.5 (*) 4.0 - 10.5 (K/uL)   RBC 2.85 (*) 4.22 - 5.81 (MIL/uL)   Hemoglobin 8.5 (*) 13.0 - 17.0 (g/dL)   HCT 45.4 (*) 09.8 - 52.0 (%)   MCV 88.8  78.0 - 100.0 (fL)   MCH 29.8  26.0 - 34.0 (pg)   MCHC 33.6  30.0 - 36.0 (g/dL)   RDW 11.9 (*) 14.7 - 15.5 (%)   Platelets 641 (*) 150 - 400 (K/uL)  COMPREHENSIVE METABOLIC PANEL     Status: Abnormal   Collection Time   04/19/11  5:55 AM      Component Value Range   Sodium 135  135 - 145 (mEq/L)   Potassium 3.7  3.5 - 5.1 (mEq/L)   Chloride 106  96 - 112 (mEq/L)   CO2 20  19 - 32 (mEq/L)   Glucose, Bld 103 (*) 70 - 99 (mg/dL)   BUN 3 (*) 6 - 23 (mg/dL)   Creatinine, Ser 8.29  0.50 - 1.35 (mg/dL)   Calcium 9.0  8.4 - 56.2 (mg/dL)   Total Protein 8.2  6.0 - 8.3 (g/dL)   Albumin 4.5  3.5 - 5.2 (g/dL)   AST 22  0 - 37 (U/L)   ALT 13  0 - 53 (U/L)   Alkaline Phosphatase 102  39 - 117 (U/L)   Total Bilirubin 3.3 (*)  0.3 - 1.2 (mg/dL)   GFR calc non Af Amer >90  >90 (mL/min)   GFR calc Af Amer >90  >90 (mL/min)  FERRITIN     Status: Abnormal   Collection Time   04/19/11  5:55 AM      Component Value Range   Ferritin 1684 (*) 22 - 322 (ng/mL)  PRO B NATRIURETIC PEPTIDE     Status: Abnormal   Collection Time   04/19/11  5:55 AM      Component Value Range   Pro B Natriuretic peptide (BNP) 580.9 (*) 0 - 125 (pg/mL)  PREPARE RBC (CROSSMATCH)     Status: Normal   Collection Time   04/19/11  7:00 AM      Component Value Range   Order Confirmation ORDER PROCESSED BY BLOOD BANK    PREPARE RBC (CROSSMATCH)     Status: Normal   Collection Time   04/19/11  9:00 AM      Component Value Range   Order Confirmation ORDER PROCESSED BY BLOOD BANK      Studies/Results: No results found.  Medications: I have reviewed the patient's current  medications.  Allergies  Allergen Reactions  . Morphine And Related Hives and Rash     Current Facility-Administered Medications  Medication Dose Route Frequency Provider Last Rate Last Dose  . albuterol (PROVENTIL) (5 MG/ML) 0.5% nebulizer solution 2.5 mg  2.5 mg Nebulization Q2H PRN Leroy Sea, MD      . deferoxamine (DESFERAL) 2,000 mg in dextrose 5 % 500 mL infusion  2,000 mg Intravenous QHS Keitha Butte, NP      . dextrose 5 % and 0.45 % NaCl with KCl 20 mEq/L infusion   Intravenous Continuous Keitha Butte, NP 50 mL/hr at 04/19/11 1013    . diphenhydrAMINE (BENADRYL) injection 25 mg  25 mg Intravenous Q4H PRN Keitha Butte, NP   25 mg at 04/19/11 1439  . enoxaparin (LOVENOX) injection 40 mg  40 mg Subcutaneous Q24H Leroy Sea, MD   40 mg at 04/19/11 1727  . folic acid (FOLVITE) tablet 1 mg  1 mg Oral Daily Leroy Sea, MD   1 mg at 04/19/11 1010  . guaiFENesin-dextromethorphan (ROBITUSSIN DM) 100-10 MG/5ML syrup 5 mL  5 mL Oral Q4H PRN Leroy Sea, MD      . HYDROmorphone (DILAUDID) injection 2-4 mg  2-4 mg Intravenous Q2H Keitha Butte, NP   4 mg at 04/19/11 1727  . hydroxyurea (HYDREA) capsule 500 mg  500 mg Oral BID Leroy Sea, MD   500 mg at 04/19/11 1010  . ibuprofen (ADVIL,MOTRIN) tablet 800 mg  800 mg Oral Q6H PRN Leroy Sea, MD      . ketorolac (TORADOL) 30 MG/ML injection 30 mg  30 mg Intravenous Q8H Keitha Butte, NP      . moxifloxacin (AVELOX) IVPB 400 mg  400 mg Intravenous Q24H Keitha Butte, NP      . mupirocin ointment (BACTROBAN) 2 %   Nasal BID Keitha Butte, NP      . OLANZapine (ZYPREXA) tablet 10 mg  10 mg Oral QHS Leroy Sea, MD   10 mg at 04/18/11 2212  . ondansetron (ZOFRAN) tablet 4 mg  4 mg Oral Q4H PRN Keitha Butte, NP       Or  . ondansetron (ZOFRAN) injection 4 mg  4 mg Intravenous Q4H PRN Keitha Butte, NP   4 mg at 04/19/11  1438  . oxyCODONE (OXYCONTIN) 12 hr  tablet 80 mg  80 mg Oral Q12H Leroy Sea, MD   80 mg at 04/19/11 1009  . polyethylene glycol (MIRALAX / GLYCOLAX) packet 17 g  17 g Oral Daily Keitha Butte, NP   17 g at 04/18/11 1907  . potassium chloride (K-DUR,KLOR-CON) CR tablet 10 mEq  10 mEq Oral BID Keitha Butte, NP   10 mEq at 04/19/11 1009  . sodium chloride 0.9 % injection 10-40 mL  10-40 mL Intracatheter Q12H Cheri Guppy, MD      . sodium chloride 0.9 % injection 10-40 mL  10-40 mL Intracatheter PRN Cheri Guppy, MD   10 mL at 04/19/11 1655  . sodium chloride 0.9 % injection 3 mL  3 mL Intravenous Q12H Leroy Sea, MD   3 mL at 04/18/11 0937  . zolpidem (AMBIEN) tablet 5 mg  5 mg Oral QHS PRN Jinger Neighbors, NP   5 mg at 04/18/11 2213  . DISCONTD: 0.9 %  sodium chloride infusion   Intravenous Continuous Leroy Sea, MD 125 mL/hr at 04/18/11 1524    . DISCONTD: cefTRIAXone (ROCEPHIN) 1 g in dextrose 5 % 50 mL IVPB  1 g Intravenous Q24H Leroy Sea, MD   1 g at 04/19/11 1602  . DISCONTD: diphenhydrAMINE (BENADRYL) injection 25 mg  25 mg Intravenous Q6H PRN Leroy Sea, MD   25 mg at 04/18/11 1544  . DISCONTD: HYDROmorphone (DILAUDID) injection 2-4 mg  2-4 mg Intravenous Q2H PRN Jinger Neighbors, NP   4 mg at 04/18/11 1546  . DISCONTD: lidocaine (LIDODERM) 5 % 1 patch  1 patch Transdermal Q24H Keitha Butte, NP   1 patch at 04/19/11 [phone]  . DISCONTD: potassium chloride SA (K-DUR,KLOR-CON) CR tablet 40 mEq  40 mEq Oral BID Leroy Sea, MD   40 mEq at 04/18/11 1191    Assessment/Plan: Patient Active Problem List  Diagnoses  . Sickle cell pain crisis  . Leukocytosis  . Chronic pain  . Hypokalemia  . Thrombocytosis  . Hemochromatosis  . Elevated BNP    Sickle Cell Crisis/Chronic Pain:  The patient received a blood exchange today and a blood transfusion yesterday.  An additional unit of PRBCs is asked to be crossmatched tomorrow and on standby in case the patient needs  transfusion/exchange soon.   The patient will continue pain/nausea/pruritis/bowel management, IV hydration, home medications,  DVT prophylaxis. CMP and CBC in the am.  Hemoglobinopathy is pending.  Will check 3 sets of cardiac enzymes and Ddimer to r/o PE vs costochondritis vs pleuritic pain Leukocytosis/Thrombocytocis:  The patient will be changed to IV Avelox for antibiotic therapy, IS usage is encouraged - Will continue to monitor  Hypokalemia:  The patient is receiving PO potassium supplementation daily and IVFs with 20 mEq KCL - will continue to monitor Hemochromatosis:  The patient will receive Desferal infusions x 2 and will recheck Ferritin level on 04/21/11 Elevated BNP:  The patient's IVF's have been decreased and will recheck BNP in the am  Discussed and agreed upon plan of care with the patient.  Plan of care will be adjusted based on the patient's clinical progress.   Larina Bras, NP-C 04/19/2011, 5:16 PM

## 2011-04-20 DIAGNOSIS — D5701 Hb-SS disease with acute chest syndrome: Secondary | ICD-10-CM | POA: Diagnosis not present

## 2011-04-20 DIAGNOSIS — IMO0002 Reserved for concepts with insufficient information to code with codable children: Secondary | ICD-10-CM | POA: Diagnosis not present

## 2011-04-20 DIAGNOSIS — D571 Sickle-cell disease without crisis: Secondary | ICD-10-CM | POA: Diagnosis not present

## 2011-04-20 DIAGNOSIS — G9341 Metabolic encephalopathy: Secondary | ICD-10-CM | POA: Diagnosis not present

## 2011-04-20 DIAGNOSIS — D5700 Hb-SS disease with crisis, unspecified: Secondary | ICD-10-CM | POA: Diagnosis not present

## 2011-04-20 DIAGNOSIS — F29 Unspecified psychosis not due to a substance or known physiological condition: Secondary | ICD-10-CM | POA: Diagnosis not present

## 2011-04-20 DIAGNOSIS — I471 Supraventricular tachycardia: Secondary | ICD-10-CM | POA: Diagnosis not present

## 2011-04-20 LAB — COMPREHENSIVE METABOLIC PANEL
BUN: 6 mg/dL (ref 6–23)
CO2: 18 mEq/L — ABNORMAL LOW (ref 19–32)
Chloride: 106 mEq/L (ref 96–112)
Creatinine, Ser: 0.65 mg/dL (ref 0.50–1.35)
GFR calc non Af Amer: >90 mL/min (ref >90)
Glucose, Bld: 124 mg/dL — ABNORMAL HIGH (ref 70–99)
Total Bilirubin: 3.4 mg/dL — ABNORMAL HIGH (ref 0.3–1.2)

## 2011-04-20 LAB — CARDIAC PANEL(CRET KIN+CKTOT+MB+TROPI)
Total CK: 106 U/L (ref 7–232)
Troponin I: <0.3 ng/mL (ref <0.30)

## 2011-04-20 LAB — PRO B NATRIURETIC PEPTIDE: Pro B Natriuretic peptide (BNP): 177.3 pg/mL — ABNORMAL HIGH (ref 0–125)

## 2011-04-20 LAB — CBC
HCT: 25.5 % — ABNORMAL LOW (ref 39.0–52.0)
MCV: 86.7 fL (ref 78.0–100.0)
RBC: 2.94 MIL/uL — ABNORMAL LOW (ref 4.22–5.81)
WBC: 23.9 10*3/uL — ABNORMAL HIGH (ref 4.0–10.5)

## 2011-04-20 LAB — MRSA CULTURE

## 2011-04-20 MED ORDER — OLANZAPINE 10 MG PO TABS
10.0000 mg | ORAL_TABLET | Freq: Once | ORAL | Status: AC
  Administered 2011-04-20: 10 mg via ORAL
  Filled 2011-04-20: qty 1

## 2011-04-20 NOTE — Progress Notes (Signed)
Subjective:  Patient feeling better today than yesterday. He rates his pain as a 7-8/10. This is mostly around his sides. Pain is not pleuritic. He notes that the pain medication is helping. He is using his incentive barometer as well. He has a rare cough. No other new complaints.   Allergies  Allergen Reactions  . Morphine And Related Hives and Rash   Current Facility-Administered Medications  Medication Dose Route Frequency Provider Last Rate Last Dose  . albuterol (PROVENTIL) (5 MG/ML) 0.5% nebulizer solution 2.5 mg  2.5 mg Nebulization Q2H PRN Leroy Sea, MD      . deferoxamine (DESFERAL) 2,000 mg in dextrose 5 % 500 mL infusion  2,000 mg Intravenous QHS Keitha Butte, NP 125 mL/hr at 04/20/11 2125 2,000 mg at 04/20/11 2125  . dextrose 5 % and 0.45 % NaCl with KCl 20 mEq/L infusion   Intravenous Continuous Keitha Butte, NP 50 mL/hr at 04/20/11 0556    . diphenhydrAMINE (BENADRYL) injection 25 mg  25 mg Intravenous Q4H PRN Keitha Butte, NP   25 mg at 04/20/11 2002  . enoxaparin (LOVENOX) injection 40 mg  40 mg Subcutaneous Q24H Leroy Sea, MD   40 mg at 04/20/11 1729  . folic acid (FOLVITE) tablet 1 mg  1 mg Oral Daily Leroy Sea, MD   1 mg at 04/20/11 0959  . guaiFENesin-dextromethorphan (ROBITUSSIN DM) 100-10 MG/5ML syrup 5 mL  5 mL Oral Q4H PRN Leroy Sea, MD      . HYDROmorphone (DILAUDID) injection 2-4 mg  2-4 mg Intravenous Q2H Keitha Butte, NP   4 mg at 04/20/11 2003  . hydroxyurea (HYDREA) capsule 500 mg  500 mg Oral BID Leroy Sea, MD   500 mg at 04/20/11 2125  . ibuprofen (ADVIL,MOTRIN) tablet 800 mg  800 mg Oral Q6H PRN Leroy Sea, MD      . ketorolac (TORADOL) 30 MG/ML injection 30 mg  30 mg Intravenous Q8H Keitha Butte, NP   30 mg at 04/20/11 2124  . moxifloxacin (AVELOX) IVPB 400 mg  400 mg Intravenous Q24H Keitha Butte, NP   400 mg at 04/20/11 1729  . mupirocin ointment (BACTROBAN) 2 %   Nasal BID  Keitha Butte, NP      . OLANZapine (ZYPREXA) tablet 10 mg  10 mg Oral QHS Leroy Sea, MD   10 mg at 04/19/11 2119  . OLANZapine (ZYPREXA) tablet 10 mg  10 mg Oral Once Gwenyth Bender, MD   10 mg at 04/20/11 2125  . ondansetron (ZOFRAN) tablet 4 mg  4 mg Oral Q4H PRN Keitha Butte, NP       Or  . ondansetron (ZOFRAN) injection 4 mg  4 mg Intravenous Q4H PRN Keitha Butte, NP   4 mg at 04/20/11 2002  . oxyCODONE (OXYCONTIN) 12 hr tablet 80 mg  80 mg Oral Q12H Leroy Sea, MD   80 mg at 04/20/11 0959  . polyethylene glycol (MIRALAX / GLYCOLAX) packet 17 g  17 g Oral Daily Keitha Butte, NP   17 g at 04/18/11 1907  . potassium chloride (K-DUR,KLOR-CON) CR tablet 10 mEq  10 mEq Oral BID Keitha Butte, NP   10 mEq at 04/20/11 2125  . sodium chloride 0.9 % injection 10-40 mL  10-40 mL Intracatheter Q12H Cheri Guppy, MD      . sodium chloride 0.9 % injection 10-40 mL  10-40 mL Intracatheter PRN  Cheri Guppy, MD   10 mL at 04/20/11 0119  . sodium chloride 0.9 % injection 3 mL  3 mL Intravenous Q12H Leroy Sea, MD   3 mL at 04/18/11 0937  . zolpidem (AMBIEN) tablet 5 mg  5 mg Oral QHS PRN Jinger Neighbors, NP   5 mg at 04/19/11 2159    Objective: Blood pressure 135/82, pulse 91, temperature 98 F (36.7 C), temperature source Oral, resp. rate 16, height 6' (1.829 m), weight 183 lb 6.4 oz (83.19 kg), SpO2 98.00%.  Well-developed well-nourished black male in no acute distress. HEENT:no sinus tenderness. Mild sclera icterus. NECK:no posterior cervical nodes. LUNGS:clear to auscultation. No vocal fremitus. BM:WUXLKG S1, S2 without S3. ABD:no gastric tenderness. MWN:UUVOZDGUYQ in the lower ribs bilaterally. No knee tenderness. Negative Homans. NEURO:intact.  Lab results: Results for orders placed during the hospital encounter of 04/17/11 (from the past 48 hour(s))  MAGNESIUM     Status: Normal   Collection Time   04/19/11  5:55 AM      Component  Value Range Comment   Magnesium 1.7  1.5 - 2.5 (mg/dL)   PHOSPHORUS     Status: Normal   Collection Time   04/19/11  5:55 AM      Component Value Range Comment   Phosphorus 3.3  2.3 - 4.6 (mg/dL)   CBC     Status: Abnormal   Collection Time   04/19/11  5:55 AM      Component Value Range Comment   WBC 23.5 (*) 4.0 - 10.5 (K/uL)    RBC 2.85 (*) 4.22 - 5.81 (MIL/uL)    Hemoglobin 8.5 (*) 13.0 - 17.0 (g/dL)    HCT 03.4 (*) 74.2 - 52.0 (%)    MCV 88.8  78.0 - 100.0 (fL)    MCH 29.8  26.0 - 34.0 (pg)    MCHC 33.6  30.0 - 36.0 (g/dL)    RDW 59.5 (*) 63.8 - 15.5 (%)    Platelets 641 (*) 150 - 400 (K/uL)   COMPREHENSIVE METABOLIC PANEL     Status: Abnormal   Collection Time   04/19/11  5:55 AM      Component Value Range Comment   Sodium 135  135 - 145 (mEq/L)    Potassium 3.7  3.5 - 5.1 (mEq/L)    Chloride 106  96 - 112 (mEq/L)    CO2 20  19 - 32 (mEq/L)    Glucose, Bld 103 (*) 70 - 99 (mg/dL)    BUN 3 (*) 6 - 23 (mg/dL)    Creatinine, Ser 7.56  0.50 - 1.35 (mg/dL)    Calcium 9.0  8.4 - 10.5 (mg/dL)    Total Protein 8.2  6.0 - 8.3 (g/dL)    Albumin 4.5  3.5 - 5.2 (g/dL)    AST 22  0 - 37 (U/L)    ALT 13  0 - 53 (U/L)    Alkaline Phosphatase 102  39 - 117 (U/L)    Total Bilirubin 3.3 (*) 0.3 - 1.2 (mg/dL)    GFR calc non Af Amer >90  >90 (mL/min)    GFR calc Af Amer >90  >90 (mL/min)   FERRITIN     Status: Abnormal   Collection Time   04/19/11  5:55 AM      Component Value Range Comment   Ferritin 1684 (*) 22 - 322 (ng/mL) Result confirmed by automatic dilution.  PRO B NATRIURETIC PEPTIDE     Status: Abnormal   Collection Time  04/19/11  5:55 AM      Component Value Range Comment   Pro B Natriuretic peptide (BNP) 580.9 (*) 0 - 125 (pg/mL)   PREPARE RBC (CROSSMATCH)     Status: Normal   Collection Time   04/19/11  7:00 AM      Component Value Range Comment   Order Confirmation ORDER PROCESSED BY BLOOD BANK     PREPARE RBC (CROSSMATCH)     Status: Normal   Collection Time    04/19/11  9:00 AM      Component Value Range Comment   Order Confirmation ORDER PROCESSED BY BLOOD BANK     CARDIAC PANEL(CRET KIN+CKTOT+MB+TROPI)     Status: Normal   Collection Time   04/19/11  5:30 PM      Component Value Range Comment   Total CK 76  7 - 232 (U/L)    CK, MB 1.5  0.3 - 4.0 (ng/mL)    Troponin I <0.30  <0.30 (ng/mL)    Relative Index RELATIVE INDEX IS INVALID  0.0 - 2.5    PREPARE RBC (CROSSMATCH)     Status: Normal   Collection Time   04/19/11  6:00 PM      Component Value Range Comment   Order Confirmation ORDER PROCESSED BY BLOOD BANK     D-DIMER, QUANTITATIVE     Status: Abnormal   Collection Time   04/19/11  7:25 PM      Component Value Range Comment   D-Dimer, Quant 0.86 (*) 0.00 - 0.48 (ug/mL-FEU)   CARDIAC PANEL(CRET KIN+CKTOT+MB+TROPI)     Status: Normal   Collection Time   04/20/11 12:15 AM      Component Value Range Comment   Total CK 87  7 - 232 (U/L)    CK, MB 1.5  0.3 - 4.0 (ng/mL)    Troponin I <0.30  <0.30 (ng/mL)    Relative Index RELATIVE INDEX IS INVALID  0.0 - 2.5    CBC     Status: Abnormal   Collection Time   04/20/11  6:35 AM      Component Value Range Comment   WBC 23.9 (*) 4.0 - 10.5 (K/uL) CONSISTENT WITH PREVIOUS RESULT   RBC 2.94 (*) 4.22 - 5.81 (MIL/uL)    Hemoglobin 8.8 (*) 13.0 - 17.0 (g/dL)    HCT 40.9 (*) 81.1 - 52.0 (%)    MCV 86.7  78.0 - 100.0 (fL)    MCH 29.9  26.0 - 34.0 (pg)    MCHC 34.5  30.0 - 36.0 (g/dL)    RDW 91.4 (*) 78.2 - 15.5 (%)    Platelets 547 (*) 150 - 400 (K/uL)   COMPREHENSIVE METABOLIC PANEL     Status: Abnormal   Collection Time   04/20/11  6:35 AM      Component Value Range Comment   Sodium 136  135 - 145 (mEq/L)    Potassium 3.8  3.5 - 5.1 (mEq/L)    Chloride 106  96 - 112 (mEq/L)    CO2 18 (*) 19 - 32 (mEq/L)    Glucose, Bld 124 (*) 70 - 99 (mg/dL)    BUN 6  6 - 23 (mg/dL)    Creatinine, Ser 9.56  0.50 - 1.35 (mg/dL)    Calcium 8.9  8.4 - 10.5 (mg/dL)    Total Protein 8.1  6.0 - 8.3 (g/dL)     Albumin 4.5  3.5 - 5.2 (g/dL)    AST 28  0 - 37 (U/L)  ALT 18  0 - 53 (U/L)    Alkaline Phosphatase 98  39 - 117 (U/L)    Total Bilirubin 3.4 (*) 0.3 - 1.2 (mg/dL)    GFR calc non Af Amer >90  >90 (mL/min)    GFR calc Af Amer >90  >90 (mL/min)   PRO B NATRIURETIC PEPTIDE     Status: Abnormal   Collection Time   04/20/11  6:35 AM      Component Value Range Comment   Pro B Natriuretic peptide (BNP) 177.3 (*) 0 - 125 (pg/mL)   CARDIAC PANEL(CRET KIN+CKTOT+MB+TROPI)     Status: Normal   Collection Time   04/20/11  9:00 AM      Component Value Range Comment   Total CK 106  7 - 232 (U/L)    CK, MB 1.2  0.3 - 4.0 (ng/mL)    Troponin I <0.30  <0.30 (ng/mL)    Relative Index 1.1  0.0 - 2.5      Studies/Results: No results found.  Patient Active Problem List  Diagnoses  . Sickle cell pain crisis  . Leukocytosis  . Chronic pain  . Hypokalemia  . Metabolic acidosis  . Thrombocytosis  . Hemochromatosis  . Hypomagnesemia  . Acute chest syndrome due to sickle cell crisis  . Diarrhea  . Elevated brain natriuretic peptide (BNP) level  . Bronchitis  . Sickle cell anemia  . Acute embolism and thrombosis of right internal jugular vein  . 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 Hemochromatosis. Elevated proBNP secondary to above.This is decrease with decreased IV fluids. History of avascular necrosis. Noncardiac chest pain.   Plan: Continue present therapy. Consider exchange transfusion if the pain does not continue to subside. Continue pulmonary treatment.   [name], Milarose Savich 04/20/2011 9:59 PM

## 2011-04-20 NOTE — Progress Notes (Signed)
Dr. [name] also wrote new orders to give zyprexa now

## 2011-04-20 NOTE — Progress Notes (Signed)
Pt. Refuses to wear telemetry monitor.  He continues to pull off leads and removes batteries.  MD paged waiting on call back.

## 2011-04-20 NOTE — ED Provider Notes (Signed)
Medical screening examination/treatment/procedure(s) were performed by non-physician practitioner and as supervising physician I was immediately available for consultation/collaboration.   Gwyneth Sprout, MD 04/20/11 1525

## 2011-04-20 NOTE — Progress Notes (Signed)
Dr. [name] called back with new orders to  Discontinue telemetry and to triage to [facility].

## 2011-04-21 DIAGNOSIS — D5701 Hb-SS disease with acute chest syndrome: Secondary | ICD-10-CM | POA: Diagnosis not present

## 2011-04-21 DIAGNOSIS — IMO0002 Reserved for concepts with insufficient information to code with codable children: Secondary | ICD-10-CM | POA: Diagnosis not present

## 2011-04-21 DIAGNOSIS — I471 Supraventricular tachycardia: Secondary | ICD-10-CM | POA: Diagnosis not present

## 2011-04-21 DIAGNOSIS — F29 Unspecified psychosis not due to a substance or known physiological condition: Secondary | ICD-10-CM | POA: Diagnosis not present

## 2011-04-21 DIAGNOSIS — D571 Sickle-cell disease without crisis: Secondary | ICD-10-CM | POA: Diagnosis not present

## 2011-04-21 DIAGNOSIS — D5700 Hb-SS disease with crisis, unspecified: Secondary | ICD-10-CM | POA: Diagnosis not present

## 2011-04-21 DIAGNOSIS — G9341 Metabolic encephalopathy: Secondary | ICD-10-CM

## 2011-04-21 LAB — CBC
HCT: 24.6 % — ABNORMAL LOW (ref 39.0–52.0)
MCHC: 34.6 g/dL (ref 30.0–36.0)
MCV: 86.3 fL (ref 78.0–100.0)
RDW: 19.9 % — ABNORMAL HIGH (ref 11.5–15.5)

## 2011-04-21 LAB — TYPE AND SCREEN
Antibody Screen: NEGATIVE
Unit division: 0
Unit division: 0
Unit division: 0

## 2011-04-21 LAB — COMPREHENSIVE METABOLIC PANEL
Albumin: 4.5 g/dL (ref 3.5–5.2)
BUN: 7 mg/dL (ref 6–23)
Chloride: 107 mEq/L (ref 96–112)
Creatinine, Ser: 0.69 mg/dL (ref 0.50–1.35)
GFR calc Af Amer: >90 mL/min (ref >90)
Glucose, Bld: 107 mg/dL — ABNORMAL HIGH (ref 70–99)
Total Bilirubin: 4.4 mg/dL — ABNORMAL HIGH (ref 0.3–1.2)
Total Protein: 8.1 g/dL (ref 6.0–8.3)

## 2011-04-21 LAB — FERRITIN: Ferritin: 1930 ng/mL — ABNORMAL HIGH (ref 22–322)

## 2011-04-21 LAB — AMMONIA: Ammonia: 473 umol/L — ABNORMAL HIGH (ref 11–60)

## 2011-04-21 MED ORDER — LEVALBUTEROL HCL 0.63 MG/3ML IN NEBU
0.6300 mg | INHALATION_SOLUTION | Freq: Three times a day (TID) | RESPIRATORY_TRACT | Status: DC
  Administered 2011-04-21 (×2): 0.63 mg via RESPIRATORY_TRACT
  Filled 2011-04-21 (×3): qty 3

## 2011-04-21 MED ORDER — ONDANSETRON HCL 4 MG/2ML IJ SOLN
4.0000 mg | Freq: Four times a day (QID) | INTRAMUSCULAR | Status: DC
  Administered 2011-04-21: 4 mg via INTRAVENOUS
  Filled 2011-04-21: qty 2

## 2011-04-21 MED ORDER — ONDANSETRON HCL 4 MG/2ML IJ SOLN
4.0000 mg | INTRAMUSCULAR | Status: DC
  Administered 2011-04-21 – 2011-04-25 (×20): 4 mg via INTRAVENOUS
  Filled 2011-04-21 (×20): qty 2

## 2011-04-21 MED ORDER — ALTEPLASE 2 MG IJ SOLR
2.0000 mg | Freq: Once | INTRAMUSCULAR | Status: AC
  Administered 2011-04-21: 2 mg
  Filled 2011-04-21: qty 2

## 2011-04-21 MED ORDER — LACTULOSE 10 GM/15ML PO SOLN
20.0000 g | Freq: Three times a day (TID) | ORAL | Status: DC
  Administered 2011-04-21 – 2011-04-22 (×3): 20 g via ORAL
  Filled 2011-04-21 (×5): qty 30

## 2011-04-21 MED ORDER — LEVALBUTEROL HCL 0.63 MG/3ML IN NEBU
0.6300 mg | INHALATION_SOLUTION | Freq: Two times a day (BID) | RESPIRATORY_TRACT | Status: DC
  Administered 2011-04-22: 0.63 mg via RESPIRATORY_TRACT
  Filled 2011-04-21 (×4): qty 3

## 2011-04-21 MED ORDER — LEVALBUTEROL HCL 0.63 MG/3ML IN NEBU
0.6300 mg | INHALATION_SOLUTION | Freq: Three times a day (TID) | RESPIRATORY_TRACT | Status: DC | PRN
  Filled 2011-04-21: qty 3

## 2011-04-21 MED ORDER — KETOROLAC TROMETHAMINE 30 MG/ML IJ SOLN
30.0000 mg | Freq: Three times a day (TID) | INTRAMUSCULAR | Status: AC
  Administered 2011-04-21 – 2011-04-24 (×9): 30 mg via INTRAVENOUS
  Filled 2011-04-21 (×12): qty 1

## 2011-04-21 MED ORDER — DEXTROSE 5 % IV SOLN
2000.0000 mg | Freq: Every day | INTRAVENOUS | Status: DC
  Administered 2011-04-21 – 2011-04-28 (×8): 2000 mg via INTRAVENOUS
  Filled 2011-04-21 (×10): qty 2

## 2011-04-21 MED ORDER — HALOPERIDOL 0.5 MG PO TABS
0.5000 mg | ORAL_TABLET | Freq: Three times a day (TID) | ORAL | Status: DC
  Administered 2011-04-21 – 2011-04-22 (×4): 0.5 mg via ORAL
  Filled 2011-04-21 (×6): qty 1

## 2011-04-21 NOTE — Progress Notes (Signed)
Attempt blood removal right PICC red lumen - 50ml drawn and then unable to draw more or flush - tpa ordered for both lumens. VSandritter RN/VABC

## 2011-04-21 NOTE — Progress Notes (Signed)
Pt. Disconnected IV tubing from PICC, IV team nurse paged.  Pt seems confused at times stating that people are in his room.

## 2011-04-21 NOTE — Progress Notes (Signed)
Was unable to triage pt. No bed available on [facility]

## 2011-04-21 NOTE — Progress Notes (Signed)
Pt. Is currently hallucinating stating that tigers and girls are on his bed, 6 am dose of dilaudid not given due to pt hallucinationating, also since pt. Has been reconnected to IV fluids the pump has been alarming occluded, IV team notified again, also  MD paged  Again of new assessments, and day shift RN is expecting call from Dr. [name].

## 2011-04-21 NOTE — Progress Notes (Signed)
Pt is very agitated this a.m.  Pt is confused about events over night and when he is due to get medications.

## 2011-04-21 NOTE — Progress Notes (Signed)
Subjective: The patient was seen on rounds today.  The patient was agitated and hallucinating as he saw objects in his room (snakes) that were not there.  Spoke at length to the patient about the agitation and hallucinations until he visibly calmed down.  Explained to the patient that I will be starting him on Haldol PO TID for the hallucinations/agitation - the patient agreed to this.   The patient rates his pain as 8/10 today in his bilateral flank area that radiates to his back.  The patient states that he has had a bit of nausea lately and has an episode of emesis every morning.  Let the patient know that I will schedule his Zofran.  The patient states breathing treatments help his trunk discomfort - Nebulizer treatments scheduled for the next 24 hours.  Also spoke to the patient about medication compliance - the patient agreed to comply.  The patient states that he has been having regular bowel movements.  Let the patient know that I will be scheduling him for 2 blood exchanges once blood is available.  The patient's nurse explained the patient's hallucinatory and agitated behavior that she observed and that was reported to her by the night nurse.  No other nursing or patient concerns.   Objective: Vital signs in last 24 hours: Blood pressure 156/79, pulse 118, temperature 98.5 F (36.9 C), temperature source Oral, resp. rate 18, height 6' (1.829 m), weight 183 lb 3.2 oz (83.099 kg), SpO2 97.00%.  General Appearance: Alert, agitated but cooperative, well developed, well nourished, severe distress due to hallucinations Head: Normocephalic, without obvious abnormality, atraumatic Eyes: PERRLA, EOMI, the patient has slight strabismus, scleral icterus Nose: Nares normal, septum midline, mucosa normal, no drainage or sinus tenderness. Throat:  Normal lips, mucosa, tongue and gums Back: Symmetric, bilateral CVA/flank tenderness, diffuse tenderness in back Resp: CTA, Diminished bibasilar breath sounds,  no wheezes/rales/rhonchi Cardio: S1, S2 regular, no murmurs/clicks/rubs/gallops GI: Soft, non-tender, normoactive bowel sounds, no masses, no organomegaly Male Genitalia: Deferred, the patient denies priapism Extremities: Extremities normal, atraumatic, no cyanosis or edema, negative Homans sign, no sign of DVT Pulses: 2+ and symmetric Skin: Skin color, texture, turgor normal, no rashes or lesions, numerous tattoos Neurologic: Grossly normal, CN II-XII intact, no focal deficits Psych:  Agitation initially but then returned to an appropriate affect  Lab Results: Results for orders placed during the hospital encounter of 04/17/11 (from the past 24 hour(s))  CBC     Status: Abnormal   Collection Time   04/21/11  5:25 AM      Component Value Range   WBC 18.7 (*) 4.0 - 10.5 (K/uL)   RBC 2.85 (*) 4.22 - 5.81 (MIL/uL)   Hemoglobin 8.5 (*) 13.0 - 17.0 (g/dL)   HCT 16.1 (*) 09.6 - 52.0 (%)   MCV 86.3  78.0 - 100.0 (fL)   MCH 29.8  26.0 - 34.0 (pg)   MCHC 34.6  30.0 - 36.0 (g/dL)   RDW 04.5 (*) 40.9 - 15.5 (%)   Platelets 520 (*) 150 - 400 (K/uL)  COMPREHENSIVE METABOLIC PANEL     Status: Abnormal   Collection Time   04/21/11  5:25 AM      Component Value Range   Sodium 137  135 - 145 (mEq/L)   Potassium 4.2  3.5 - 5.1 (mEq/L)   Chloride 107  96 - 112 (mEq/L)   CO2 19  19 - 32 (mEq/L)   Glucose, Bld 107 (*) 70 - 99 (mg/dL)   BUN  7  6 - 23 (mg/dL)   Creatinine, Ser 1.61  0.50 - 1.35 (mg/dL)   Calcium 9.2  8.4 - 09.6 (mg/dL)   Total Protein 8.1  6.0 - 8.3 (g/dL)   Albumin 4.5  3.5 - 5.2 (g/dL)   AST 36  0 - 37 (U/L)   ALT 26  0 - 53 (U/L)   Alkaline Phosphatase 106  39 - 117 (U/L)   Total Bilirubin 4.4 (*) 0.3 - 1.2 (mg/dL)   GFR calc non Af Amer >90  >90 (mL/min)   GFR calc Af Amer >90  >90 (mL/min)    Studies/Results: No results found.  Medications: I have reviewed the patient's current medications.  Allergies  Allergen Reactions  . Morphine And Related Hives and Rash      Current Facility-Administered Medications  Medication Dose Route Frequency Provider Last Rate Last Dose  . alteplase (CATHFLO ACTIVASE) injection 2 mg  2 mg Intracatheter Once Gwenyth Bender, MD   2 mg at 04/21/11 1443  . alteplase (CATHFLO ACTIVASE) injection 2 mg  2 mg Intracatheter Once Gwenyth Bender, MD   2 mg at 04/21/11 1443  . deferoxamine (DESFERAL) 2,000 mg in dextrose 5 % 500 mL infusion  2,000 mg Intravenous QHS Keitha Butte, NP 125 mL/hr at 04/20/11 2125 2,000 mg at 04/20/11 2125  . deferoxamine (DESFERAL) 2,000 mg in dextrose 5 % 500 mL infusion  2,000 mg Intravenous QHS Keitha Butte, NP      . dextrose 5 % and 0.45 % NaCl with KCl 20 mEq/L infusion   Intravenous Continuous Keitha Butte, NP 50 mL/hr at 04/20/11 0556    . diphenhydrAMINE (BENADRYL) injection 25 mg  25 mg Intravenous Q4H PRN Keitha Butte, NP   25 mg at 04/21/11 1216  . enoxaparin (LOVENOX) injection 40 mg  40 mg Subcutaneous Q24H Leroy Sea, MD   40 mg at 04/20/11 1729  . folic acid (FOLVITE) tablet 1 mg  1 mg Oral Daily Leroy Sea, MD   1 mg at 04/21/11 1023  . guaiFENesin-dextromethorphan (ROBITUSSIN DM) 100-10 MG/5ML syrup 5 mL  5 mL Oral Q4H PRN Leroy Sea, MD      . haloperidol (HALDOL) tablet 0.5 mg  0.5 mg Oral TID Keitha Butte, NP   0.5 mg at 04/21/11 1551  . HYDROmorphone (DILAUDID) injection 2-4 mg  2-4 mg Intravenous Q2H Keitha Butte, NP   2 mg at 04/21/11 1647  . hydroxyurea (HYDREA) capsule 500 mg  500 mg Oral BID Leroy Sea, MD   500 mg at 04/21/11 1023  . ibuprofen (ADVIL,MOTRIN) tablet 800 mg  800 mg Oral Q6H PRN Leroy Sea, MD      . ketorolac (TORADOL) 30 MG/ML injection 30 mg  30 mg Intravenous Q8H Keitha Butte, NP   30 mg at 04/21/11 1406  . lactulose (CHRONULAC) 10 GM/15ML solution 20 g  20 g Oral TID Keitha Butte, NP   20 g at 04/21/11 1515  . levalbuterol (XOPENEX) nebulizer solution 0.63 mg  0.63 mg Nebulization  Q8H Keitha Butte, NP   0.63 mg at 04/21/11 1218  . moxifloxacin (AVELOX) IVPB 400 mg  400 mg Intravenous Q24H Keitha Butte, NP   400 mg at 04/20/11 1729  . mupirocin ointment (BACTROBAN) 2 %   Nasal BID Keitha Butte, NP      . OLANZapine (ZYPREXA) tablet 10 mg  10 mg Oral QHS Prashant  Curlene Labrum, MD   10 mg at 04/19/11 2119  . OLANZapine (ZYPREXA) tablet 10 mg  10 mg Oral Once Gwenyth Bender, MD   10 mg at 04/20/11 2125  . ondansetron (ZOFRAN) injection 4 mg  4 mg Intravenous Q4H Keitha Butte, NP      . oxyCODONE (OXYCONTIN) 12 hr tablet 80 mg  80 mg Oral Q12H Leroy Sea, MD   80 mg at 04/21/11 1022  . potassium chloride (K-DUR,KLOR-CON) CR tablet 10 mEq  10 mEq Oral BID Keitha Butte, NP   10 mEq at 04/21/11 1023  . sodium chloride 0.9 % injection 10-40 mL  10-40 mL Intracatheter Q12H Cheri Guppy, MD   20 mL at 04/21/11 0755  . sodium chloride 0.9 % injection 10-40 mL  10-40 mL Intracatheter PRN Cheri Guppy, MD   10 mL at 04/21/11 0500  . sodium chloride 0.9 % injection 3 mL  3 mL Intravenous Q12H Leroy Sea, MD   3 mL at 04/20/11 2200  . zolpidem (AMBIEN) tablet 5 mg  5 mg Oral QHS PRN Jinger Neighbors, NP   5 mg at 04/20/11 2159  . DISCONTD: albuterol (PROVENTIL) (5 MG/ML) 0.5% nebulizer solution 2.5 mg  2.5 mg Nebulization Q2H PRN Leroy Sea, MD      . DISCONTD: ketorolac (TORADOL) 30 MG/ML injection 30 mg  30 mg Intravenous Q8H Keitha Butte, NP   30 mg at 04/20/11 2124  . DISCONTD: ondansetron (ZOFRAN) injection 4 mg  4 mg Intravenous Q4H PRN Keitha Butte, NP   4 mg at 04/21/11 0814  . DISCONTD: ondansetron (ZOFRAN) injection 4 mg  4 mg Intravenous Q6H Keitha Butte, NP   4 mg at 04/21/11 1216  . DISCONTD: ondansetron (ZOFRAN) tablet 4 mg  4 mg Oral Q4H PRN Keitha Butte, NP      . DISCONTD: polyethylene glycol (MIRALAX / GLYCOLAX) packet 17 g  17 g Oral Daily Keitha Butte, NP   17 g at 04/21/11 1022     Assessment/Plan: Patient Active Problem List  Diagnoses  . Sickle cell pain crisis  . Leukocytosis  . Chronic pain  . Hypokalemia  . Thrombocytosis  . Hemochromatosis  . Elevated BNP  . Psychosis  . Metabolic Encephalopathy    Sickle Cell Crisis/Chronic Pain:  The patient is scheduled to receive a blood exchange x 2 once blood is available. The patient will continue pain/nausea/pruritis/bowel management, IV hydration, home medications,  DVT prophylaxis. CMP and CBC in the am.  Hemoglobinopathy is pending.  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  Plan of care will be adjusted based on the patient's clinical progress.   Larina Bras, NP-C 04/21/2011, 10:06 AM

## 2011-04-22 ENCOUNTER — Other Ambulatory Visit: Payer: Self-pay

## 2011-04-22 DIAGNOSIS — G9341 Metabolic encephalopathy: Secondary | ICD-10-CM | POA: Diagnosis not present

## 2011-04-22 DIAGNOSIS — I10 Essential (primary) hypertension: Secondary | ICD-10-CM | POA: Diagnosis not present

## 2011-04-22 DIAGNOSIS — I369 Nonrheumatic tricuspid valve disorder, unspecified: Secondary | ICD-10-CM | POA: Diagnosis not present

## 2011-04-22 DIAGNOSIS — R002 Palpitations: Secondary | ICD-10-CM | POA: Diagnosis not present

## 2011-04-22 DIAGNOSIS — R000 Tachycardia, unspecified: Secondary | ICD-10-CM | POA: Diagnosis not present

## 2011-04-22 DIAGNOSIS — D571 Sickle-cell disease without crisis: Secondary | ICD-10-CM | POA: Diagnosis not present

## 2011-04-22 DIAGNOSIS — I471 Supraventricular tachycardia: Secondary | ICD-10-CM | POA: Diagnosis not present

## 2011-04-22 DIAGNOSIS — F29 Unspecified psychosis not due to a substance or known physiological condition: Secondary | ICD-10-CM | POA: Diagnosis not present

## 2011-04-22 DIAGNOSIS — IMO0002 Reserved for concepts with insufficient information to code with codable children: Secondary | ICD-10-CM | POA: Diagnosis not present

## 2011-04-22 DIAGNOSIS — D5701 Hb-SS disease with acute chest syndrome: Secondary | ICD-10-CM | POA: Diagnosis not present

## 2011-04-22 DIAGNOSIS — D5700 Hb-SS disease with crisis, unspecified: Secondary | ICD-10-CM | POA: Diagnosis not present

## 2011-04-22 LAB — CARDIAC PANEL(CRET KIN+CKTOT+MB+TROPI)
CK, MB: 1.5 ng/mL (ref 0.3–4.0)
Relative Index: 1.1 (ref 0.0–2.5)
Total CK: 138 U/L (ref 7–232)
Total CK: 153 U/L (ref 7–232)

## 2011-04-22 LAB — COMPREHENSIVE METABOLIC PANEL
BUN: 10 mg/dL (ref 6–23)
CO2: 19 mEq/L (ref 19–32)
Calcium: 8.8 mg/dL (ref 8.4–10.5)
Chloride: 105 mEq/L (ref 96–112)
Creatinine, Ser: 0.87 mg/dL (ref 0.50–1.35)
GFR calc Af Amer: >90 mL/min (ref >90)
GFR calc non Af Amer: >90 mL/min (ref >90)
Glucose, Bld: 121 mg/dL — ABNORMAL HIGH (ref 70–99)
Total Bilirubin: 5.5 mg/dL — ABNORMAL HIGH (ref 0.3–1.2)

## 2011-04-22 LAB — CBC
HCT: 27 % — ABNORMAL LOW (ref 39.0–52.0)
MCH: 29.3 pg (ref 26.0–34.0)
MCV: 85.2 fL (ref 78.0–100.0)
Platelets: 461 10*3/uL — ABNORMAL HIGH (ref 150–400)
RBC: 3.17 MIL/uL — ABNORMAL LOW (ref 4.22–5.81)
WBC: 17.2 10*3/uL — ABNORMAL HIGH (ref 4.0–10.5)

## 2011-04-22 MED ORDER — PHENOL 1.4 % MT LIQD
1.0000 | OROMUCOSAL | Status: DC | PRN
  Administered 2011-04-22: 1 via OROMUCOSAL
  Filled 2011-04-22: qty 177

## 2011-04-22 MED ORDER — SENNOSIDES-DOCUSATE SODIUM 8.6-50 MG PO TABS
1.0000 | ORAL_TABLET | Freq: Every day | ORAL | Status: DC
  Administered 2011-04-23 – 2011-04-28 (×6): 1 via ORAL
  Filled 2011-04-22 (×8): qty 1

## 2011-04-22 MED ORDER — HALOPERIDOL 0.5 MG PO TABS
0.5000 mg | ORAL_TABLET | Freq: Three times a day (TID) | ORAL | Status: DC | PRN
  Administered 2011-04-22: 0.5 mg via ORAL
  Filled 2011-04-22: qty 1

## 2011-04-22 MED ORDER — LEVALBUTEROL HCL 0.63 MG/3ML IN NEBU
0.6300 mg | INHALATION_SOLUTION | Freq: Four times a day (QID) | RESPIRATORY_TRACT | Status: DC | PRN
  Filled 2011-04-22: qty 3

## 2011-04-22 MED ORDER — METOPROLOL TARTRATE 12.5 MG HALF TABLET
12.5000 mg | ORAL_TABLET | Freq: Two times a day (BID) | ORAL | Status: DC
  Administered 2011-04-22 – 2011-04-23 (×3): 12.5 mg via ORAL
  Filled 2011-04-22 (×4): qty 1

## 2011-04-22 MED ORDER — LIDOCAINE 5 % EX PTCH
1.0000 | MEDICATED_PATCH | CUTANEOUS | Status: DC
  Administered 2011-04-22 – 2011-04-28 (×6): 1 via TRANSDERMAL
  Filled 2011-04-22 (×9): qty 1

## 2011-04-22 NOTE — Consult Note (Signed)
Reason for Consult: Tachycardia Referring Physician: Dr. Isla Pence is an 31 y.o. male.  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 Cardiologic consultation is called as patient was noted to have marked tachycardia during vital signs check up. Patient denies any palpitation lightheadedness or syncope. Patient denies any anginal chest pain nausea vomiting diaphoresis. Patient denies any PND orthopnea or leg swelling. Subsequently EKG done showed sinus tachycardia with nondiagnostic Q waves in inferior leads LVH and nonspecific ST-T wave changes. Review of telemetry showed no evidence of SVT or V. tach on the monitor. Patient was started on low-dose beta blockers with a control of heart rate in 80s. Patient had 2-D echo done today showed normal LV systolic function with no wall motion abnormalities. Patient also had done nuclear stress test in the past the approximately 1-1/2 years ago which was negative for ischemia.  Past Medical History  Diagnosis Date  . Sickle cell anemia   . Blood transfusion   . 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 Hypokalemia   . Mood disorder   . Pulmonary embolism   . Avascular necrosis   . Leukocytosis     Chronic  . Thrombocytosis     Chronic    Past Surgical History  Procedure Date  . Right hip replacement     08/2006  . Cholecystectomy     01/2008  . Porta cath placement   . Porta cath removal   . Umbilical hernia repair     01/2008  . Excision of left periauricular cyst     10/2009  . Excision of right ear lobe cyst  with primary closur     11/2007    Family History  Problem Relation Age of Onset  . Sickle cell anemia Mother   . Sickle cell anemia Father   . Sickle cell trait Brother     Social History:  reports that he quit smoking about 9 months ago. His smoking use included Cigarettes. He quit after 13 years of use. He has never used smokeless tobacco. He reports that he does not drink alcohol or use illicit drugs.  Allergies:  Allergies  Allergen Reactions  . Morphine And Related Hives and Rash    Medications: I have reviewed the patient's current medications.  Results for orders placed during the hospital encounter of 04/17/11 (from the past 48 hour(s))  CBC     Status: Abnormal   Collection Time   04/21/11  5:25 AM      Component Value Range Comment   WBC 18.7 (*) 4.0 - 10.5 (K/uL)    RBC 2.85 (*) 4.22 - 5.81 (MIL/uL)    Hemoglobin 8.5 (*) 13.0 - 17.0 (g/dL)    HCT 47.8 (*) 29.5 - 52.0 (%)    MCV 86.3  78.0 - 100.0 (fL)    MCH 29.8  26.0 - 34.0 (pg)    MCHC 34.6  30.0 - 36.0 (g/dL)    RDW 62.1 (*) 30.8 - 15.5 (%)    Platelets 520 (*) 150 - 400 (K/uL)   COMPREHENSIVE METABOLIC PANEL  Status: Abnormal   Collection Time   04/21/11  5:25 AM      Component Value Range Comment   Sodium 137  135 - 145 (mEq/L)    Potassium 4.2  3.5 - 5.1 (mEq/L)    Chloride 107  96 - 112 (mEq/L)    CO2 19  19 - 32 (mEq/L)    Glucose, Bld 107 (*) 70 - 99 (mg/dL)    BUN 7  6 - 23 (mg/dL)    Creatinine, Ser 1.61  0.50 - 1.35 (mg/dL)    Calcium 9.2  8.4 - 10.5 (mg/dL)    Total Protein 8.1  6.0 - 8.3 (g/dL)    Albumin 4.5  3.5 - 5.2 (g/dL)    AST 36  0 - 37 (U/L)    ALT 26  0 - 53 (U/L)    Alkaline Phosphatase 106  39 - 117 (U/L)    Total Bilirubin 4.4 (*) 0.3 - 1.2 (mg/dL)    GFR calc non Af Amer >90  >90 (mL/min)    GFR calc Af Amer >90  >90 (mL/min)   FERRITIN     Status: Abnormal   Collection Time   04/21/11  5:25 AM      Component Value Range Comment   Ferritin 1930 (*) 22 - 322 (ng/mL)  Result confirmed by automatic dilution.  PREPARE RBC (CROSSMATCH)     Status: Normal   Collection Time   04/21/11 10:30 AM      Component Value Range Comment   Order Confirmation ORDER PROCESSED BY BLOOD BANK     TYPE AND SCREEN     Status: Normal (Preliminary result)   Collection Time   04/21/11 10:35 AM      Component Value Range Comment   ABO/RH(D) O POS      Antibody Screen NEG      Sample Expiration 04/24/2011      Unit Number [id number]      Blood Component Type RED CELLS,LR      Unit division 00      Status of Unit ISSUED,FINAL      Donor AG Type        Value: NEGATIVE FOR C ANTIGEN NEGATIVE FOR E ANTIGEN NEGATIVE FOR KELL ANTIGEN   Transfusion Status OK TO TRANSFUSE      Crossmatch Result COMPATIBLE      Unit Number [id number]      Blood Component Type RED CELLS,LR      Unit division 00      Status of Unit ISSUED      Donor AG Type        Value: NEGATIVE FOR C ANTIGEN NEGATIVE FOR E ANTIGEN NEGATIVE FOR KELL ANTIGEN   Transfusion Status OK TO TRANSFUSE      Crossmatch Result COMPATIBLE     AMMONIA     Status: Abnormal   Collection Time   04/21/11 10:35 AM      Component Value Range Comment   Ammonia 473 (*) 11 - 60 (umol/L)   CBC     Status: Abnormal   Collection Time   04/22/11  7:00 AM      Component Value Range Comment   WBC 17.2 (*) 4.0 - 10.5 (K/uL)    RBC 3.17 (*) 4.22 - 5.81 (MIL/uL)    Hemoglobin 9.3 (*) 13.0 - 17.0 (g/dL)    HCT 95.6 (*) 21.3 - 52.0 (%)    MCV 85.2  78.0 - 100.0 (fL)    MCH 29.3  26.0 -  34.0 (pg)    MCHC 34.4  30.0 - 36.0 (g/dL)    RDW 16.1 (*) 09.6 - 15.5 (%)    Platelets 461 (*) 150 - 400 (K/uL)   COMPREHENSIVE METABOLIC PANEL     Status: Abnormal   Collection Time   04/22/11  7:00 AM      Component Value Range Comment   Sodium 133 (*) 135 - 145 (mEq/L)    Potassium 4.4  3.5 - 5.1 (mEq/L)    Chloride 105  96 - 112 (mEq/L)    CO2 19  19 - 32 (mEq/L)    Glucose, Bld 121 (*) 70 - 99 (mg/dL)    BUN 10  6 - 23 (mg/dL)    Creatinine, Ser  0.45  0.50 - 1.35 (mg/dL)    Calcium 8.8  8.4 - 10.5 (mg/dL)    Total Protein 8.1  6.0 - 8.3 (g/dL)    Albumin 4.6  3.5 - 5.2 (g/dL)    AST 56 (*) 0 - 37 (U/L)    ALT 39  0 - 53 (U/L)    Alkaline Phosphatase 107  39 - 117 (U/L)    Total Bilirubin 5.5 (*) 0.3 - 1.2 (mg/dL)    GFR calc non Af Amer >90  >90 (mL/min)    GFR calc Af Amer >90  >90 (mL/min)   PRO B NATRIURETIC PEPTIDE     Status: Normal   Collection Time   04/22/11  7:00 AM      Component Value Range Comment   Pro B Natriuretic peptide (BNP) 66.6  0 - 125 (pg/mL)   AMMONIA     Status: Abnormal   Collection Time   04/22/11  7:00 AM      Component Value Range Comment   Ammonia 63 (*) 11 - 60 (umol/L)   GLUCOSE, CAPILLARY     Status: Abnormal   Collection Time   04/22/11 10:07 AM      Component Value Range Comment   Glucose-Capillary 139 (*) 70 - 99 (mg/dL)   CARDIAC PANEL(CRET KIN+CKTOT+MB+TROPI)     Status: Normal   Collection Time   04/22/11  3:12 PM      Component Value Range Comment   Total CK 138  7 - 232 (U/L)    CK, MB 1.5  0.3 - 4.0 (ng/mL)    Troponin I <0.30  <0.30 (ng/mL)    Relative Index 1.1  0.0 - 2.5      No results found.  Review of Systems  Constitutional: Negative for fever, chills and weight loss.  Eyes: Negative for blurred vision and double vision.  Respiratory: Negative for cough, hemoptysis and sputum production.   Cardiovascular: Negative for chest pain, palpitations and orthopnea.  Gastrointestinal: Negative for heartburn, nausea, vomiting and abdominal pain.  Musculoskeletal: Positive for myalgias, back pain and joint pain.  Neurological: Positive for dizziness. Negative for headaches.   Blood pressure 129/79, pulse 105, temperature 98.9 F (37.2 C), temperature source Oral, resp. rate 20, height 6' (1.829 m), weight 81.965 kg (180 lb 11.2 oz), SpO2 94.00%. Physical Exam  Constitutional: He is oriented to person, place, and time.  HENT:  Head: Normocephalic and atraumatic.  Eyes:  Conjunctivae are normal. Left eye exhibits no discharge. No scleral icterus.  Neck: Normal range of motion. Neck supple. No JVD present. No tracheal deviation present. No thyromegaly present.  Cardiovascular: Normal rate and regular rhythm.  Exam reveals no gallop and no friction rub.   Murmur (Soft systolic murmur noted  no S3 gallop) heard. Respiratory: Effort normal and breath sounds normal. No respiratory distress. He has no wheezes. He has no rales.  GI: Soft. Bowel sounds are normal. He exhibits no distension. There is no tenderness. There is no rebound and no guarding.  Musculoskeletal: He exhibits no edema and no tenderness.  Lymphadenopathy:    He has no cervical adenopathy.  Neurological: He is alert and oriented to person, place, and time.    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

## 2011-04-22 NOTE — Progress Notes (Signed)
Subjective: The patient was seen on rounds today.  The patient had recently had his vital signs taken by the NT and it was documented that his HR was 225.  I asked the NT to repeat a pulse ox for the patient's HR reading.  The pulse ox read the patient's HR at 145.  I then ordered and EKG which was abnormal and had changes from the EKG taken on 04/19/11.  The patient stated that he was feeling fine other than his sickle cell pain rated at a 7/10 in his back and bilateral flank area he did not feel anything else. The patient was however, complaining of a sore/scratchy throat - let the patient know that I will order him Chloraseptic spray to be kept at the bedside.  The patient state that he has had numerous bowel movements (the patient was given lactulose for metabolic encephalopathy).  Ammonia level was WNL today so I let the patient know that I would D/C the Lactulose for now, but will recheck ammonia in the am to make sure level remain acceptable.  Called Dr. Annitta Jersey office (Cardiologist)  and left a message with the staff that we were requesting cardiac consultation (Dr. Sharyn Lull was performing a cardiac catheterization).  Explained to the patient that I will be ordering a 2D Echocardiogram and will be transferring him back to the telemetry floor.  The patient agreed to this.  Spoke to the patient at length about all of his medications, their timing and why he is receiving them.  No other nursing or patient concerns.  Objective: Vital signs in last 24 hours: Blood pressure 134/87, pulse 104, temperature 99.3 F (37.4 C), temperature source Oral, resp. rate 20, height 6' (1.829 m), weight 180 lb 11.2 oz (81.965 kg), SpO2 94.00%.  General Appearance: Alert and oriented, cooperative, well developed, well nourished, mild distress Head: Normocephalic, without obvious abnormality, atraumatic Eyes: PERRLA, EOMI, the patient has slight strabismus, scleral icterus Nose: Nares normal, septum midline, mucosa  normal, no drainage or sinus tenderness. Throat:  Normal lips, mucosa, tongue and gums Back: Symmetric, bilateral CVA/flank tenderness, diffuse back tenderness Resp: CTA, Diminished bibasilar breath sounds, no wheezes/rales/rhonchi Cardio: S1, S2 regular, no murmurs/clicks/rubs/gallops GI: Soft, non-tender, normoactive bowel sounds, no masses, no organomegaly Male Genitalia: Deferred, the patient denies priapism Extremities: Extremities normal, atraumatic, no cyanosis or edema, negative Homans sign, no sign of DVT Pulses: 2+ and symmetric Skin: Skin color, texture, turgor normal, no rashes or lesions, numerous tattoos Neurologic: Grossly normal, CN II-XII intact, no focal deficits Psych:  Appropriate affect  Lab Results: Results for orders placed during the hospital encounter of 04/17/11 (from the past 24 hour(s))  CBC     Status: Abnormal   Collection Time   04/22/11  7:00 AM      Component Value Range   WBC 17.2 (*) 4.0 - 10.5 (K/uL)   RBC 3.17 (*) 4.22 - 5.81 (MIL/uL)   Hemoglobin 9.3 (*) 13.0 - 17.0 (g/dL)   HCT 16.1 (*) 09.6 - 52.0 (%)   MCV 85.2  78.0 - 100.0 (fL)   MCH 29.3  26.0 - 34.0 (pg)   MCHC 34.4  30.0 - 36.0 (g/dL)   RDW 04.5 (*) 40.9 - 15.5 (%)   Platelets 461 (*) 150 - 400 (K/uL)  COMPREHENSIVE METABOLIC PANEL     Status: Abnormal   Collection Time   04/22/11  7:00 AM      Component Value Range   Sodium 133 (*) 135 - 145 (mEq/L)  Potassium 4.4  3.5 - 5.1 (mEq/L)   Chloride 105  96 - 112 (mEq/L)   CO2 19  19 - 32 (mEq/L)   Glucose, Bld 121 (*) 70 - 99 (mg/dL)   BUN 10  6 - 23 (mg/dL)   Creatinine, Ser 1.61  0.50 - 1.35 (mg/dL)   Calcium 8.8  8.4 - 09.6 (mg/dL)   Total Protein 8.1  6.0 - 8.3 (g/dL)   Albumin 4.6  3.5 - 5.2 (g/dL)   AST 56 (*) 0 - 37 (U/L)   ALT 39  0 - 53 (U/L)   Alkaline Phosphatase 107  39 - 117 (U/L)   Total Bilirubin 5.5 (*) 0.3 - 1.2 (mg/dL)   GFR calc non Af Amer >90  >90 (mL/min)   GFR calc Af Amer >90  >90 (mL/min)  PRO B  NATRIURETIC PEPTIDE     Status: Normal   Collection Time   04/22/11  7:00 AM      Component Value Range   Pro B Natriuretic peptide (BNP) 66.6  0 - 125 (pg/mL)  AMMONIA     Status: Abnormal   Collection Time   04/22/11  7:00 AM      Component Value Range   Ammonia 63 (*) 11 - 60 (umol/L)  GLUCOSE, CAPILLARY     Status: Abnormal   Collection Time   04/22/11 10:07 AM      Component Value Range   Glucose-Capillary 139 (*) 70 - 99 (mg/dL)  CARDIAC PANEL(CRET KIN+CKTOT+MB+TROPI)     Status: Normal   Collection Time   04/22/11  3:12 PM      Component Value Range   Total CK 138  7 - 232 (U/L)   CK, MB 1.5  0.3 - 4.0 (ng/mL)   Troponin I <0.30  <0.30 (ng/mL)   Relative Index 1.1  0.0 - 2.5     Studies/Results: No results found.  Medications: I have reviewed the patient's current medications.  Allergies  Allergen Reactions  . Morphine And Related Hives and Rash     Current Facility-Administered Medications  Medication Dose Route Frequency Provider Last Rate Last Dose  . deferoxamine (DESFERAL) 2,000 mg in dextrose 5 % 500 mL infusion  2,000 mg Intravenous QHS Keitha Butte, NP 125 mL/hr at 04/21/11 2257 2,000 mg at 04/21/11 2257  . dextrose 5 % and 0.45 % NaCl with KCl 20 mEq/L infusion   Intravenous Continuous Keitha Butte, NP 50 mL/hr at 04/20/11 0556    . diphenhydrAMINE (BENADRYL) injection 25 mg  25 mg Intravenous Q4H PRN Keitha Butte, NP   25 mg at 04/22/11 1435  . enoxaparin (LOVENOX) injection 40 mg  40 mg Subcutaneous Q24H Leroy Sea, MD   40 mg at 04/21/11 1952  . folic acid (FOLVITE) tablet 1 mg  1 mg Oral Daily Leroy Sea, MD   1 mg at 04/22/11 1021  . guaiFENesin-dextromethorphan (ROBITUSSIN DM) 100-10 MG/5ML syrup 5 mL  5 mL Oral Q4H PRN Leroy Sea, MD      . haloperidol (HALDOL) tablet 0.5 mg  0.5 mg Oral Q8H PRN Keitha Butte, NP   0.5 mg at 04/22/11 1635  . HYDROmorphone (DILAUDID) injection 2-4 mg  2-4 mg Intravenous Q2H  Keitha Butte, NP   4 mg at 04/22/11 1636  . hydroxyurea (HYDREA) capsule 500 mg  500 mg Oral BID Leroy Sea, MD   500 mg at 04/22/11 1023  . ibuprofen (ADVIL,MOTRIN) tablet 800 mg  800 mg Oral Q6H PRN Leroy Sea, MD      . ketorolac (TORADOL) 30 MG/ML injection 30 mg  30 mg Intravenous Q8H Keitha Butte, NP   30 mg at 04/22/11 1434  . levalbuterol (XOPENEX) nebulizer solution 0.63 mg  0.63 mg Nebulization BID Gwenyth Bender, MD      . levalbuterol [facility]) nebulizer solution 0.63 mg  0.63 mg Nebulization Q8H PRN Gwenyth Bender, MD      . lidocaine (LIDODERM) 5 % 1 patch  1 patch Transdermal Q24H Keitha Butte, NP      . metoprolol tartrate (LOPRESSOR) tablet 12.5 mg  12.5 mg Oral BID Keitha Butte, NP   12.5 mg at 04/22/11 1450  . moxifloxacin (AVELOX) IVPB 400 mg  400 mg Intravenous Q24H Keitha Butte, NP   400 mg at 04/21/11 1953  . mupirocin ointment (BACTROBAN) 2 %   Nasal BID Keitha Butte, NP      . OLANZapine (ZYPREXA) tablet 10 mg  10 mg Oral QHS Leroy Sea, MD   10 mg at 04/21/11 2244  . ondansetron (ZOFRAN) injection 4 mg  4 mg Intravenous Q4H Keitha Butte, NP   4 mg at 04/22/11 1435  . oxyCODONE (OXYCONTIN) 12 hr tablet 80 mg  80 mg Oral Q12H Leroy Sea, MD   80 mg at 04/22/11 1021  . phenol (CHLORASEPTIC) mouth spray 1 spray  1 spray Mouth/Throat PRN Keitha Butte, NP   1 spray at 04/22/11 1636  . potassium chloride (K-DUR,KLOR-CON) CR tablet 10 mEq  10 mEq Oral BID Keitha Butte, NP   10 mEq at 04/22/11 1000  . senna-docusate (Senokot-S) tablet 1 tablet  1 tablet Oral QHS Keitha Butte, NP      . sodium chloride 0.9 % injection 10-40 mL  10-40 mL Intracatheter Q12H Cheri Guppy, MD   20 mL at 04/21/11 0755  . sodium chloride 0.9 % injection 10-40 mL  10-40 mL Intracatheter PRN Cheri Guppy, MD   10 mL at 04/21/11 0500  . sodium chloride 0.9 % injection 3 mL  3 mL Intravenous Q12H Leroy Sea, MD   3 mL at 04/22/11 1000  . zolpidem (AMBIEN) tablet 5 mg  5 mg Oral QHS PRN Jinger Neighbors, NP   5 mg at 04/21/11 2244  . DISCONTD: haloperidol (HALDOL) tablet 0.5 mg  0.5 mg Oral TID Keitha Butte, NP   0.5 mg at 04/22/11 1021  . DISCONTD: lactulose (CHRONULAC) 10 GM/15ML solution 20 g  20 g Oral TID Keitha Butte, NP   20 g at 04/22/11 1022  . DISCONTD: levalbuterol (XOPENEX) nebulizer solution 0.63 mg  0.63 mg Nebulization Q8H Keitha Butte, NP   0.63 mg at 04/21/11 2154    Assessment/Plan: Patient Active Problem List  Diagnoses  . Sickle cell pain crisis  . Leukocytosis  . Chronic pain  . Hypokalemia  . Thrombocytosis  . Hemochromatosis  . Elevated BNP  . Psychosis  . Metabolic Encephalopathy  . Tachycardia    Sickle Cell Crisis/Chronic Pain:  The patient had a blood exchange x 2 yesterday and tolerated the exchanges well.  The patient will continue pain/nausea/pruritis/bowel management, IV hydration, home medications,  DVT prophylaxis. CMP and CBC in the am.  Hemoglobinopathy is pending.  Leukocytosis/Thrombocytocis:  The patient is receiving IV Avelox for antibiotic therapy, IS usage is encouraged, nebulizer treatments scheduled for next 24 hours - Will continue to  monitor  Hypokalemia:  The patient is receiving PO potassium supplementation BID and IVFs with 20 mEq KCL - will continue to monitor Hemochromatosis:  Ferritin level is elevated - Patient will continue nightly Desferal infusions - Will recheck on 04/23/11 Elevated BNP:  BNP is WNL - Will recheck on 04/24/11 Psychosis:  The patient has low-dose Haldol available PRN for agitation - will continue to monitor Metablolic Encephalopathy:  Ammonia level is WNL - will recheck in the am to determine if Lactulose needs to be restarted.  Tachycardia:  The patient will have serial cardiac enzymes x3, a 2D Echo has been performed (2D Echo shows pulmonary HTN and right sided HF which is commonly seen in SCD  patients), the patient was started on Metoprolol BID (with holding parameters) and Dr. Sharyn Lull, Cardiologist has been constulted  Discussed and agreed upon plan of care with the patient.  Plan of care will be adjusted based on the patient's clinical progress.   Larina Bras, NP-C 04/22/2011, 4:22 PM

## 2011-04-22 NOTE — Progress Notes (Signed)
*  PRELIMINARY RESULTS* Echocardiogram 2D Echocardiogram has been performed.  Glean Salen [facility] 04/22/2011, 3:35 PM

## 2011-04-23 DIAGNOSIS — IMO0002 Reserved for concepts with insufficient information to code with codable children: Secondary | ICD-10-CM | POA: Diagnosis not present

## 2011-04-23 DIAGNOSIS — I471 Supraventricular tachycardia: Secondary | ICD-10-CM | POA: Diagnosis not present

## 2011-04-23 DIAGNOSIS — I10 Essential (primary) hypertension: Secondary | ICD-10-CM | POA: Diagnosis not present

## 2011-04-23 DIAGNOSIS — R002 Palpitations: Secondary | ICD-10-CM | POA: Diagnosis not present

## 2011-04-23 DIAGNOSIS — G9341 Metabolic encephalopathy: Secondary | ICD-10-CM | POA: Diagnosis not present

## 2011-04-23 DIAGNOSIS — F29 Unspecified psychosis not due to a substance or known physiological condition: Secondary | ICD-10-CM | POA: Diagnosis not present

## 2011-04-23 DIAGNOSIS — D5701 Hb-SS disease with acute chest syndrome: Secondary | ICD-10-CM | POA: Diagnosis not present

## 2011-04-23 DIAGNOSIS — R000 Tachycardia, unspecified: Secondary | ICD-10-CM | POA: Diagnosis not present

## 2011-04-23 DIAGNOSIS — D5700 Hb-SS disease with crisis, unspecified: Secondary | ICD-10-CM | POA: Diagnosis not present

## 2011-04-23 LAB — CBC
HCT: 23.4 % — ABNORMAL LOW (ref 39.0–52.0)
Hemoglobin: 8.2 g/dL — ABNORMAL LOW (ref 13.0–17.0)
RDW: 18.5 % — ABNORMAL HIGH (ref 11.5–15.5)
WBC: 18.5 10*3/uL — ABNORMAL HIGH (ref 4.0–10.5)

## 2011-04-23 LAB — TYPE AND SCREEN: ABO/RH(D): O POS

## 2011-04-23 LAB — COMPREHENSIVE METABOLIC PANEL
ALT: 35 U/L (ref 0–53)
Albumin: 4 g/dL (ref 3.5–5.2)
Alkaline Phosphatase: 97 U/L (ref 39–117)
BUN: 10 mg/dL (ref 6–23)
Chloride: 107 mEq/L (ref 96–112)
Potassium: 4.4 mEq/L (ref 3.5–5.1)
Total Bilirubin: 4.4 mg/dL — ABNORMAL HIGH (ref 0.3–1.2)

## 2011-04-23 LAB — CULTURE, BLOOD (ROUTINE X 2)
Culture  Setup Time: 201303171847
Culture: NO GROWTH

## 2011-04-23 LAB — AMMONIA: Ammonia: 62 umol/L — ABNORMAL HIGH (ref 11–60)

## 2011-04-23 LAB — CARDIAC PANEL(CRET KIN+CKTOT+MB+TROPI)
Relative Index: 1.1 (ref 0.0–2.5)
Total CK: 136 U/L (ref 7–232)

## 2011-04-23 MED ORDER — MUPIROCIN 2 % EX OINT
TOPICAL_OINTMENT | CUTANEOUS | Status: AC
  Administered 2011-04-23: 13:00:00
  Filled 2011-04-23: qty 22

## 2011-04-23 MED ORDER — METOPROLOL TARTRATE 25 MG PO TABS
25.0000 mg | ORAL_TABLET | Freq: Two times a day (BID) | ORAL | Status: DC
  Administered 2011-04-23 – 2011-04-27 (×9): 25 mg via ORAL
  Filled 2011-04-23 (×11): qty 1

## 2011-04-23 NOTE — Plan of Care (Signed)
Problem: Phase I Progression Outcomes Goal: EF % per last Echo/documented,Core Reminder form on chart Outcome: Completed/Met Date Met:  04/23/11 Echo completed 3/22.

## 2011-04-23 NOTE — Progress Notes (Signed)
Subjective: Patient complains of back and rib pain grade 7/10. Denies any palpitations. Had episode of nonsustained SVT earlier last night asymptomatic  Objective:  Vital Signs in the last 24 hours: Temp:  [97.4 F (36.3 C)-99.3 F (37.4 C)] 97.4 F (36.3 C) (03/23 0711) Pulse Rate:  [76-223] 81  (03/23 1025) Resp:  [20] 20  (03/23 0711) BP: (121-148)/(78-94) 121/78 mmHg (03/23 1025) SpO2:  [94 %-99 %] 99 % (03/23 1025) Weight:  [81.3 kg (179 lb 3.7 oz)] 81.3 kg (179 lb 3.7 oz) (03/23 0711)  Intake/Output from previous day: 03/22 0701 - 03/23 0700 In: 1340 [P.O.:240; I.V.:350; IV Piggyback:750] Out: -  Intake/Output from this shift:    Physical Exam: Neck: no adenopathy, no carotid bruit, no JVD and supple, symmetrical, trachea midline Lungs: clear to auscultation bilaterally Heart: regular rate and rhythm, S1, S2 normal and Soft systolic murmur noted Abdomen: soft, non-tender; bowel sounds normal; no masses,  no organomegaly Extremities: extremities normal, atraumatic, no cyanosis or edema  Lab Results:  Basename 04/23/11 0230 04/22/11 0700  WBC 18.5* 17.2*  HGB 8.2* 9.3*  PLT 454* 461*    Basename 04/23/11 0230 04/22/11 0700  NA 136 133*  K 4.4 4.4  CL 107 105  CO2 21 19  GLUCOSE 118* 121*  BUN 10 10  CREATININE 0.66 0.87    Basename 04/23/11 0230 04/22/11 2030  TROPONINI <0.30 <0.30   Hepatic Function Panel  Basename 04/23/11 0230  PROT 7.3  ALBUMIN 4.0  AST 48*  ALT 35  ALKPHOS 97  BILITOT 4.4*  BILIDIR --  IBILI --   No results found for this basename: CHOL in the last 72 hours No results found for this basename: PROTIME in the last 72 hours  Imaging: Imaging results have been reviewed and No results found.  Cardiac Studies:  Assessment/Plan:  Status post paroxysmal supraventricular tachycardia Resolving sickle cell crisis Hypertension History of PE in the past History of avascular necrosis of right hip status post right total hip  replacement Marked leukocytosis Hemochromatosis Status post metabolic encephalopathy Increase LFTs Plan Increase Lopressor as per orders  LOS: 6 days    Shari Natt N 04/23/2011, 1:33 PM

## 2011-04-23 NOTE — Progress Notes (Addendum)
0045 Pt found standing at bathroom door with PICC tubing completely severed and IV bags found on counter in bathroom. HR 150's to 160's. Pt grossly confused and states he is hearing and seeing men in his room. When told by this RN that it is probably too much medication on board, patient states "no, it's my ammonia level too high." Pt assisted back to bed and instructed not to get oob alone again. Bed alarm set. PICC line re-established and infusing well. Ginny Forth

## 2011-04-24 DIAGNOSIS — I10 Essential (primary) hypertension: Secondary | ICD-10-CM | POA: Diagnosis not present

## 2011-04-24 DIAGNOSIS — D5700 Hb-SS disease with crisis, unspecified: Secondary | ICD-10-CM | POA: Diagnosis not present

## 2011-04-24 DIAGNOSIS — R000 Tachycardia, unspecified: Secondary | ICD-10-CM | POA: Diagnosis not present

## 2011-04-24 DIAGNOSIS — F29 Unspecified psychosis not due to a substance or known physiological condition: Secondary | ICD-10-CM | POA: Diagnosis not present

## 2011-04-24 DIAGNOSIS — R002 Palpitations: Secondary | ICD-10-CM | POA: Diagnosis not present

## 2011-04-24 DIAGNOSIS — I471 Supraventricular tachycardia: Secondary | ICD-10-CM | POA: Diagnosis not present

## 2011-04-24 DIAGNOSIS — D5701 Hb-SS disease with acute chest syndrome: Secondary | ICD-10-CM | POA: Diagnosis not present

## 2011-04-24 DIAGNOSIS — G9341 Metabolic encephalopathy: Secondary | ICD-10-CM | POA: Diagnosis not present

## 2011-04-24 DIAGNOSIS — IMO0002 Reserved for concepts with insufficient information to code with codable children: Secondary | ICD-10-CM | POA: Diagnosis not present

## 2011-04-24 LAB — COMPREHENSIVE METABOLIC PANEL
BUN: 9 mg/dL (ref 6–23)
Calcium: 8.4 mg/dL (ref 8.4–10.5)
Creatinine, Ser: 0.62 mg/dL (ref 0.50–1.35)
GFR calc Af Amer: >90 mL/min (ref >90)
GFR calc non Af Amer: >90 mL/min (ref >90)
Glucose, Bld: 114 mg/dL — ABNORMAL HIGH (ref 70–99)
Sodium: 136 mEq/L (ref 135–145)
Total Protein: 6.9 g/dL (ref 6.0–8.3)

## 2011-04-24 LAB — CBC
HCT: 23 % — ABNORMAL LOW (ref 39.0–52.0)
Hemoglobin: 7.7 g/dL — ABNORMAL LOW (ref 13.0–17.0)
MCH: 28.7 pg (ref 26.0–34.0)
MCHC: 33.5 g/dL (ref 30.0–36.0)
MCV: 85.8 fL (ref 78.0–100.0)

## 2011-04-24 NOTE — Progress Notes (Signed)
Subjective:  Patient continues to have back and rib pains grade 7/10 Denies any palpitations no further episodes of SVT  Objective:  Vital Signs in the last 24 hours: Temp:  [97.6 F (36.4 C)-98.1 F (36.7 C)] 97.6 F (36.4 C) (03/24 0431) Pulse Rate:  [76-80] 76  (03/24 0431) Resp:  [20] 20  (03/24 0431) BP: (114-135)/(66-71) 135/66 mmHg (03/24 0431) SpO2:  [95 %] 95 % (03/24 0431) Weight:  [81.1 kg (178 lb 12.7 oz)] 81.1 kg (178 lb 12.7 oz) (03/24 0431)  Intake/Output from previous day: 03/23 0701 - 03/24 0700 In: 1968.8 [I.V.:1190.8; IV Piggyback:778] Out: -  Intake/Output from this shift:    Physical Exam: Neck: no adenopathy, no carotid bruit, no JVD and supple, symmetrical, trachea midline Lungs: clear to auscultation bilaterally Heart: regular rate and rhythm, S1, S2 normal and Soft systolic murmur noted Abdomen: soft, non-tender; bowel sounds normal; no masses,  no organomegaly Extremities: extremities normal, atraumatic, no cyanosis or edema  Lab Results:  Basename 04/24/11 0415 04/23/11 0230  WBC 12.9* 18.5*  HGB 7.7* 8.2*  PLT 427* 454*    Basename 04/24/11 0415 04/23/11 0230  NA 136 136  K 4.1 4.4  CL 108 107  CO2 22 21  GLUCOSE 114* 118*  BUN 9 10  CREATININE 0.62 0.66    Basename 04/23/11 0230 04/22/11 2030  TROPONINI <0.30 <0.30   Hepatic Function Panel  Basename 04/24/11 0415  PROT 6.9  ALBUMIN 3.7  AST 41*  ALT 29  ALKPHOS 94  BILITOT 3.5*  BILIDIR --  IBILI --   No results found for this basename: CHOL in the last 72 hours No results found for this basename: PROTIME in the last 72 hours  Imaging: Imaging results have been reviewed and No results found.  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  LOS: 7 days    Kaiah Hosea N 04/24/2011, 2:28 PM

## 2011-04-25 ENCOUNTER — Inpatient Hospital Stay (HOSPITAL_COMMUNITY): Payer: Medicare Other

## 2011-04-25 DIAGNOSIS — R000 Tachycardia, unspecified: Secondary | ICD-10-CM | POA: Diagnosis not present

## 2011-04-25 DIAGNOSIS — R0602 Shortness of breath: Secondary | ICD-10-CM | POA: Diagnosis not present

## 2011-04-25 DIAGNOSIS — J9819 Other pulmonary collapse: Secondary | ICD-10-CM | POA: Diagnosis not present

## 2011-04-25 DIAGNOSIS — D5701 Hb-SS disease with acute chest syndrome: Secondary | ICD-10-CM | POA: Diagnosis not present

## 2011-04-25 DIAGNOSIS — G9341 Metabolic encephalopathy: Secondary | ICD-10-CM | POA: Diagnosis not present

## 2011-04-25 DIAGNOSIS — IMO0002 Reserved for concepts with insufficient information to code with codable children: Secondary | ICD-10-CM | POA: Diagnosis not present

## 2011-04-25 DIAGNOSIS — I471 Supraventricular tachycardia: Secondary | ICD-10-CM | POA: Diagnosis not present

## 2011-04-25 DIAGNOSIS — R002 Palpitations: Secondary | ICD-10-CM | POA: Diagnosis not present

## 2011-04-25 DIAGNOSIS — F29 Unspecified psychosis not due to a substance or known physiological condition: Secondary | ICD-10-CM | POA: Diagnosis not present

## 2011-04-25 DIAGNOSIS — D571 Sickle-cell disease without crisis: Secondary | ICD-10-CM | POA: Diagnosis not present

## 2011-04-25 DIAGNOSIS — I10 Essential (primary) hypertension: Secondary | ICD-10-CM | POA: Diagnosis not present

## 2011-04-25 DIAGNOSIS — D5700 Hb-SS disease with crisis, unspecified: Secondary | ICD-10-CM | POA: Diagnosis not present

## 2011-04-25 DIAGNOSIS — R079 Chest pain, unspecified: Secondary | ICD-10-CM | POA: Diagnosis not present

## 2011-04-25 LAB — COMPREHENSIVE METABOLIC PANEL
ALT: 27 U/L (ref 0–53)
Albumin: 3.9 g/dL (ref 3.5–5.2)
Alkaline Phosphatase: 96 U/L (ref 39–117)
BUN: 8 mg/dL (ref 6–23)
Chloride: 105 mEq/L (ref 96–112)
GFR calc Af Amer: >90 mL/min (ref >90)
Glucose, Bld: 106 mg/dL — ABNORMAL HIGH (ref 70–99)
Potassium: 4.2 mEq/L (ref 3.5–5.1)
Sodium: 137 mEq/L (ref 135–145)
Total Bilirubin: 3.1 mg/dL — ABNORMAL HIGH (ref 0.3–1.2)

## 2011-04-25 LAB — CBC
HCT: 22.6 % — ABNORMAL LOW (ref 39.0–52.0)
Hemoglobin: 7.7 g/dL — ABNORMAL LOW (ref 13.0–17.0)
RBC: 2.65 MIL/uL — ABNORMAL LOW (ref 4.22–5.81)
WBC: 11.5 10*3/uL — ABNORMAL HIGH (ref 4.0–10.5)

## 2011-04-25 LAB — AMMONIA

## 2011-04-25 LAB — HEMOGLOBINOPATHY EVALUATION
Hemoglobin Other: 0 %
Hgb A: 38.4 % — ABNORMAL LOW (ref 96.8–97.8)

## 2011-04-25 LAB — PRO B NATRIURETIC PEPTIDE: Pro B Natriuretic peptide (BNP): 55.5 pg/mL (ref 0–125)

## 2011-04-25 LAB — PREPARE RBC (CROSSMATCH)

## 2011-04-25 MED ORDER — ALTEPLASE 2 MG IJ SOLR: 2.0000 mg | Freq: Once | INTRAMUSCULAR | Status: DC

## 2011-04-25 MED ORDER — HYDROMORPHONE HCL 4 MG PO TABS: 4.0000 mg | ORAL_TABLET | ORAL | Status: DC | PRN

## 2011-04-25 MED ORDER — ONDANSETRON HCL 4 MG/2ML IJ SOLN
4.0000 mg | INTRAMUSCULAR | Status: DC | PRN
  Administered 2011-04-25 – 2011-04-29 (×22): 4 mg via INTRAVENOUS
  Filled 2011-04-25 (×22): qty 2

## 2011-04-25 MED ORDER — ALTEPLASE 100 MG IV SOLR
2.0000 mg | Freq: Once | INTRAVENOUS | Status: AC
  Administered 2011-04-25: 2 mg
  Filled 2011-04-25: qty 2

## 2011-04-25 MED ORDER — ALTEPLASE 100 MG IV SOLR
2.0000 mg | Freq: Once | INTRAVENOUS | Status: AC
  Administered 2011-04-25: 2 mg
  Filled 2011-04-25 (×2): qty 2

## 2011-04-25 MED ORDER — HYDROMORPHONE HCL PF 2 MG/ML IJ SOLN
2.0000 mg | INTRAMUSCULAR | Status: DC | PRN
  Administered 2011-04-25: 2 mg via INTRAVENOUS
  Filled 2011-04-25: qty 2

## 2011-04-25 MED ORDER — HYDROMORPHONE 0.3 MG/ML IV SOLN
INTRAVENOUS | Status: AC
  Filled 2011-04-25: qty 25

## 2011-04-25 MED ORDER — HYDROMORPHONE HCL PF 2 MG/ML IJ SOLN
2.0000 mg | INTRAMUSCULAR | Status: DC | PRN
  Administered 2011-04-25: 4 mg via INTRAVENOUS
  Filled 2011-04-25: qty 2

## 2011-04-25 MED ORDER — MOXIFLOXACIN HCL 400 MG PO TABS
400.0000 mg | ORAL_TABLET | Freq: Every day | ORAL | Status: DC
  Administered 2011-04-26 – 2011-04-28 (×3): 400 mg via ORAL
  Filled 2011-04-25 (×5): qty 1

## 2011-04-25 MED ORDER — IOHEXOL 300 MG/ML  SOLN
100.0000 mL | Freq: Once | INTRAMUSCULAR | Status: AC | PRN
  Administered 2011-04-25: 100 mL via INTRAVENOUS

## 2011-04-25 MED ORDER — SODIUM CHLORIDE 0.9 % IJ SOLN: 9.0000 mL | INTRAMUSCULAR | Status: DC | PRN

## 2011-04-25 MED ORDER — NALOXONE HCL 0.4 MG/ML IJ SOLN: 0.4000 mg | INTRAMUSCULAR | Status: DC | PRN

## 2011-04-25 MED ORDER — HYDROMORPHONE HCL PF 2 MG/ML IJ SOLN
2.0000 mg | INTRAMUSCULAR | Status: DC
  Administered 2011-04-25 – 2011-04-26 (×8): 4 mg via INTRAVENOUS
  Administered 2011-04-26 (×2): 2 mg via INTRAVENOUS
  Administered 2011-04-26 – 2011-04-27 (×10): 4 mg via INTRAVENOUS
  Administered 2011-04-27: 2 mg via INTRAVENOUS
  Administered 2011-04-27 (×2): 4 mg via INTRAVENOUS
  Administered 2011-04-27: 2 mg via INTRAVENOUS
  Administered 2011-04-27 – 2011-04-28 (×5): 4 mg via INTRAVENOUS
  Administered 2011-04-28: 2 mg via INTRAVENOUS
  Administered 2011-04-28 (×4): 4 mg via INTRAVENOUS
  Administered 2011-04-28: 2 mg via INTRAVENOUS
  Administered 2011-04-28 – 2011-04-29 (×8): 4 mg via INTRAVENOUS
  Filled 2011-04-25 (×3): qty 2
  Filled 2011-04-25: qty 1
  Filled 2011-04-25 (×2): qty 2
  Filled 2011-04-25: qty 1
  Filled 2011-04-25 (×6): qty 2
  Filled 2011-04-25: qty 1
  Filled 2011-04-25: qty 2
  Filled 2011-04-25: qty 1
  Filled 2011-04-25 (×6): qty 2
  Filled 2011-04-25: qty 1
  Filled 2011-04-25 (×15): qty 2
  Filled 2011-04-25: qty 1
  Filled 2011-04-25 (×5): qty 2

## 2011-04-25 MED ORDER — HYDROMORPHONE 0.3 MG/ML IV SOLN: INTRAVENOUS | Status: DC

## 2011-04-25 NOTE — Progress Notes (Signed)
tPA was ordered and instilled for sluggish blood return and difficult to flush. tPA left to dwell for approximately 45 mins. Removed with good blood return both ports. Purple better than Red port. Completed TBE.

## 2011-04-25 NOTE — Progress Notes (Signed)
Subjective: The patient was seen on rounds today.  The patient was sitting on his bed, watching television and had just finished eating his meal tray.  The patient is rating his pain at a 7/10 in his back and bilateral flank (R flank pain> L flank pain). The patient agreed to receiving a PCA for pain management. The patient had another incident of confusion over the weekend that was noticed by his nurse and ammonia level was WNL.  Will order a CTA of the chest as the patient has a history of PE and will order a CT of the brain due to continued confusion - the patient was agreeable to this.  Dr. Sharyn Lull, Cardiologist evaluated the patient over the weekend and believes the patient's episode of SVT was transient in nature.  Will continue to telemetry monitor the patient.  The patient will also receive a blood exchange x 2 today.  Spoke to the patient at length about all of the upcoming studies and procedures today - the patient stated he understood. Spoke to the patient's nurse about all the items ordered for the patient also.  No other nursing or patient concerns.   **NOTE:  The patient was able to tolerated the PCA pump for only a few hours and requested to have his pain medication scheduled again - PCA discontinued and Dilaudid has been scheduled.  Objective: Vital signs in last 24 hours: Blood pressure 123/70, pulse 64, temperature 98 F (36.7 C), temperature source Oral, resp. rate 18, height 6' (1.829 m), weight 178 lb 2.1 oz (80.8 kg), SpO2 98.00%.  General Appearance: Alert and oriented, cooperative, well developed, well nourished, mild distress Head: Normocephalic, without obvious abnormality, atraumatic Eyes: PERRLA, EOMI, the patient has slight strabismus, scleral icterus Nose: Nares normal, septum midline, mucosa normal, no drainage or sinus tenderness. Throat:  Normal lips, mucosa, tongue and gums Back: Symmetric, bilateral CVA/flank tenderness, diffuse back tenderness Resp: CTA,  Diminished bibasilar breath sounds, no wheezes/rales/rhonchi Cardio: S1, S2 regular, no murmurs/clicks/rubs/gallops GI: Soft, non-tender, normoactive bowel sounds, no masses, no organomegaly Male Genitalia: Deferred, the patient denies priapism Extremities: Extremities normal, atraumatic, no cyanosis or edema, negative Homans sign, no sign of DVT Pulses: 2+ and symmetric Skin: Skin color, texture, turgor normal, no rashes or lesions, numerous tattoos Neurologic: Grossly normal, CN II-XII intact, no focal deficits Psych:  Appropriate affect  Lab Results: Results for orders placed during the hospital encounter of 04/17/11 (from the past 24 hour(s))  CBC     Status: Abnormal   Collection Time   04/25/11  6:50 AM      Component Value Range   WBC 11.5 (*) 4.0 - 10.5 (K/uL)   RBC 2.65 (*) 4.22 - 5.81 (MIL/uL)   Hemoglobin 7.7 (*) 13.0 - 17.0 (g/dL)   HCT 40.9 (*) 81.1 - 52.0 (%)   MCV 85.3  78.0 - 100.0 (fL)   MCH 29.1  26.0 - 34.0 (pg)   MCHC 34.1  30.0 - 36.0 (g/dL)   RDW 91.4 (*) 78.2 - 15.5 (%)   Platelets 480 (*) 150 - 400 (K/uL)  COMPREHENSIVE METABOLIC PANEL     Status: Abnormal   Collection Time   04/25/11  6:50 AM      Component Value Range   Sodium 137  135 - 145 (mEq/L)   Potassium 4.2  3.5 - 5.1 (mEq/L)   Chloride 105  96 - 112 (mEq/L)   CO2 24  19 - 32 (mEq/L)   Glucose, Bld 106 (*) 70 -  99 (mg/dL)   BUN 8  6 - 23 (mg/dL)   Creatinine, Ser 1.61  0.50 - 1.35 (mg/dL)   Calcium 8.8  8.4 - 09.6 (mg/dL)   Total Protein 7.5  6.0 - 8.3 (g/dL)   Albumin 3.9  3.5 - 5.2 (g/dL)   AST 38 (*) 0 - 37 (U/L)   ALT 27  0 - 53 (U/L)   Alkaline Phosphatase 96  39 - 117 (U/L)   Total Bilirubin 3.1 (*) 0.3 - 1.2 (mg/dL)   GFR calc non Af Amer >90  >90 (mL/min)   GFR calc Af Amer >90  >90 (mL/min)  PRO B NATRIURETIC PEPTIDE     Status: Normal   Collection Time   04/25/11  6:50 AM      Component Value Range   Pro B Natriuretic peptide (BNP) 55.5  0 - 125 (pg/mL)     Studies/Results: Ct Head Wo Contrast  04/25/2011  *RADIOLOGY REPORT*  Clinical Data: Confusion and agitation  CT HEAD WITHOUT CONTRAST  Technique:  Contiguous axial images were obtained from the base of the skull through the vertex without contrast.  Comparison: 12/23/2009  Findings: Ventricle size is normal.  Negative for intracranial hemorrhage. Negative for acute infarct or mass.  Small hypodensity left   caudate may represent chronic ischemia or benign cyst. Calvarium is intact.  IMPRESSION: Negative for acute abnormality.  Original Report Authenticated By: Camelia Phenes, M.D.   Ct Angio Chest W/cm &/or Wo Cm  04/25/2011  *RADIOLOGY REPORT*  Clinical Data: Chest pain, shortness of breath, headache, dizziness, evaluate for pulmonary embolism  CT ANGIOGRAPHY CHEST  Technique:  Multidetector CT imaging of the chest using the standard protocol during bolus administration of intravenous contrast. Multiplanar reconstructed images including MIPs were obtained and reviewed to evaluate the vascular anatomy.  Contrast:  100 ml Omnipaque-300  Comparison: Chest CTA - 12/16/2010  Vascular findings:  There is adequate opacification of the pulmonary arteries with the main pulmonary artery measuring 247 HU (image 47, series five). There is no discrete intraluminal filling defect within the pulmonary arteries to suggest acute pulmonary embolism, no evaluation of the distal subsegmental pulmonary arteries is limited secondary to patient motion artifact.  There is grossly unchanged mild enlargement of the main pulmonary artery, measuring approximately 3.1 cm in greatest transverse axial dimension (image 42).  Unchanged cardiomegaly.  No pericardial effusion.  Right upper extremity approach PICC line tip terminates within the superior cavoatrial junction.  Normal caliber of the thoracic aorta. Normal configuration of the aortic arch.  Visualized portions of the arch vessels are patent. No periaortic stranding.  Non  vascular findings:  Evaluation of the bilateral lung base is obscured secondary to patient motion artifact.  There is minimal bibasilar linear heterogeneous opacities favored to represent subsegmental atelectasis.  No focal airspace opacities.  No pleural effusion or pneumothorax.  Central airways are patent.  Shoddy prevascular lymph nodes are grossly unchanged with index prevascular node measuring approximate 6 mm in short axis diameter (image 32), not enlarged by CT criteria.  Right infrahilar lymph node is grossly unchanged measuring 9 mm in short axis diameter (image 45) also not enlarged by CT criteria.  Shoddy grossly symmetric bilateral axillary lymph nodes grossly unchanged favored to be reactive in etiology.  Evaluation of the upper abdomen is limited to the arterial phase of enhancement.  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  No acute aggressive osseous abnormalities.  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  IMPRESSION: 1.  No acute findings within the chest.  Specifically, no evidence of pulmonary embolism to the level of the bilateral subsegmental pulmonary arteries.  No focal airspace opacities to suggest pneumonia. 2.  Unchanged cardiomegaly and borderline enlargement of the main pulmonary artery, nonspecific but may be seen in the setting of pulmonary arterial hypertension.  Further evaluation may be obtained cardiac echo as clinically indicated. 3.  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  Original Report Authenticated By: Waynard Reeds, M.D.   Medications: I have reviewed the patient's current medications.  Allergies  Allergen Reactions  . Morphine And Related Hives and Rash     Current  Facility-Administered Medications  Medication Dose Route Frequency Provider Last Rate Last Dose  . deferoxamine (DESFERAL) 2,000 mg in dextrose 5 % 500 mL infusion  2,000 mg Intravenous QHS Keitha Butte, NP 125 mL/hr at 04/24/11 2058 2,000 mg at 04/24/11 2058  . dextrose 5 % and 0.45 % NaCl with KCl 20 mEq/L infusion   Intravenous Continuous Keitha Butte, NP 50 mL/hr at 04/25/11 0406    . diphenhydrAMINE (BENADRYL) injection 25 mg  25 mg Intravenous Q4H PRN Keitha Butte, NP   25 mg at 04/25/11 1009  . enoxaparin (LOVENOX) injection 40 mg  40 mg Subcutaneous Q24H Leroy Sea, MD   40 mg at 04/24/11 1854  . folic acid (FOLVITE) tablet 1 mg  1 mg Oral Daily Leroy Sea, MD   1 mg at 04/25/11 1055  . guaiFENesin-dextromethorphan (ROBITUSSIN DM) 100-10 MG/5ML syrup 5 mL  5 mL Oral Q4H PRN Leroy Sea, MD      . haloperidol (HALDOL) tablet 0.5 mg  0.5 mg Oral Q8H PRN Keitha Butte, NP   0.5 mg at 04/22/11 1635  . HYDROmorphone (DILAUDID) injection 2-4 mg  2-4 mg Intravenous Q4H PRN Keitha Butte, NP   2 mg at 04/25/11 1018  . HYDROmorphone (DILAUDID) injection 2-4 mg  2-4 mg Intravenous Q2H Keitha Butte, NP      . hydroxyurea (HYDREA) capsule 500 mg  500 mg Oral BID Leroy Sea, MD   500 mg at 04/25/11 1055  . ibuprofen (ADVIL,MOTRIN) tablet 800 mg  800 mg Oral Q6H PRN Leroy Sea, MD      . levalbuterol Pauline Aus) nebulizer solution 0.63 mg  0.63 mg Nebulization QID PRN Gwenyth Bender, MD      . lidocaine (LIDODERM) 5 % 1 patch  1 patch Transdermal Q24H Keitha Butte, NP   1 patch at 04/24/11 1853  . metoprolol tartrate (LOPRESSOR) tablet 25 mg  25 mg Oral BID Robynn Pane, MD   25 mg at 04/25/11 1055  . moxifloxacin (AVELOX) tablet 400 mg  400 mg Oral q1800 Keitha Butte, NP      . naloxone [facility]) injection 0.4 mg  0.4 mg Intravenous PRN Keitha Butte, NP       And  . sodium chloride 0.9 % injection 9 mL  9 mL  Intravenous PRN Keitha Butte, NP      . OLANZapine (ZYPREXA) tablet 10 mg  10 mg Oral QHS Leroy Sea, MD   10 mg at 04/24/11 2057  . ondansetron (ZOFRAN) injection 4 mg  4 mg Intravenous Q4H  PRN Keitha Butte, NP   4 mg at 04/25/11 1018  . oxyCODONE (OXYCONTIN) 12 hr tablet 80 mg  80 mg Oral Q12H Leroy Sea, MD   80 mg at 04/25/11 1018  . phenol (CHLORASEPTIC) mouth spray 1 spray  1 spray Mouth/Throat PRN Keitha Butte, NP   1 spray at 04/22/11 1636  . potassium chloride (K-DUR,KLOR-CON) CR tablet 10 mEq  10 mEq Oral BID Keitha Butte, NP   10 mEq at 04/25/11 1055  . senna-docusate (Senokot-S) tablet 1 tablet  1 tablet Oral QHS Keitha Butte, NP   1 tablet at 04/24/11 2056  . sodium chloride 0.9 % injection 10-40 mL  10-40 mL Intracatheter Q12H Cheri Guppy, MD   10 mL at 04/24/11 2105  . sodium chloride 0.9 % injection 10-40 mL  10-40 mL Intracatheter PRN Cheri Guppy, MD   10 mL at 04/25/11 0649  . sodium chloride 0.9 % injection 3 mL  3 mL Intravenous Q12H Leroy Sea, MD   3 mL at 04/25/11 1000  . zolpidem (AMBIEN) tablet 5 mg  5 mg Oral QHS PRN Jinger Neighbors, NP   5 mg at 04/24/11 2217  . DISCONTD: HYDROmorphone (DILAUDID) injection 2-4 mg  2-4 mg Intravenous Q2H Keitha Butte, NP   4 mg at 04/25/11 4098  . DISCONTD: HYDROmorphone (DILAUDID) PCA injection 0.3 mg/mL   Intravenous Q4H Keitha Butte, NP      . DISCONTD: moxifloxacin (AVELOX) IVPB 400 mg  400 mg Intravenous Q24H Keitha Butte, NP   400 mg at 04/24/11 1853  . DISCONTD: ondansetron (ZOFRAN) injection 4 mg  4 mg Intravenous Q4H Keitha Butte, NP   4 mg at 04/25/11 1191    Assessment/Plan: Patient Active Problem List  Diagnoses  . Sickle cell pain crisis  . Leukocytosis  . Chronic pain  . Hypokalemia  . Thrombocytosis  . Hemochromatosis  . Elevated BNP  . Psychosis  . Metabolic Encephalopathy  . Tachycardia    Sickle Cell Crisis/Chronic  Pain:  The patient is scheduled to have a blood exchange x 2 today.  The patient is also scheduled to receive a PCA.  The patient will continue pain/nausea/pruritis/bowel management, IV hydration, home medications,  DVT prophylaxis. CMP and CBC in the am.  Hemoglobinopathy is pending.   Leukocytosis/Thrombocytocis:  The patient has been converted to PO Avelox for antibiotic therapy, IS usage is encouraged, nebulizer treatments are available PRN - Will continue to monitor  Hypokalemia:  WNL - The patient is receiving PO potassium supplementation BID and IVFs with 20 mEq KCL - will continue to monitor Hemochromatosis:  Ferritin level will be rechecked in the am  - Patient will continue nightly Desferal infusions  Elevated BNP:  ProBNP is WNL - Will continue to monitor Psychosis:  The patient has low-dose Haldol available PRN for agitation - will continue to monitor Metablolic Encephalopathy:  Ammonia level will be rechecked today Tachycardia:  The patient is receiving Metoprolol BID (with holding parameters)  We thank Dr. Sharyn Lull for the consultation.  Discussed and agreed upon plan of care with the patient.  Plan of care will be adjusted based on the patient's clinical progress.   Larina Bras, NP-C 04/25/2011, 9:37 AM

## 2011-04-25 NOTE — Progress Notes (Signed)
Subjective:  Patient complains of back leg and flank pain Denies any chest pain shortness of breath or palpitations No further episodes of SVT on the monitor BP remained stable tolerating metoprolol tartrate. Patient had occasional episodes of confusion workup is in progress  Objective:  Vital Signs in the last 24 hours: Temp:  [97.9 F (36.6 C)-98.9 F (37.2 C)] 98.7 F (37.1 C) (03/25 1500) Pulse Rate:  [64-79] 79  (03/25 1500) Resp:  [12-18] 13  (03/25 1500) BP: (113-123)/(70-75) 123/75 mmHg (03/25 1500) SpO2:  [96 %-99 %] 99 % (03/25 1500) Weight:  [80.8 kg (178 lb 2.1 oz)] 80.8 kg (178 lb 2.1 oz) (03/25 0500)  Intake/Output from previous day: 03/24 0701 - 03/25 0700 In: 2182.7 [P.O.:240; I.V.:1186.7; IV Piggyback:756] Out: -  Intake/Output from this shift: Total I/O In: 120 [P.O.:120] Out: 70 [Other:70]  Physical Exam: Neck: no adenopathy, no carotid bruit, no JVD and supple, symmetrical, trachea midline Lungs: clear to auscultation bilaterally Heart: regular rate and rhythm, S1, S2 normal and Soft systolic murmur noted Abdomen: soft, non-tender; bowel sounds normal; no masses,  no organomegaly Extremities: extremities normal, atraumatic, no cyanosis or edema  Lab Results:  Basename 04/25/11 0650 04/24/11 0415  WBC 11.5* 12.9*  HGB 7.7* 7.7*  PLT 480* 427*    Basename 04/25/11 0650 04/24/11 0415  NA 137 136  K 4.2 4.1  CL 105 108  CO2 24 22  GLUCOSE 106* 114*  BUN 8 9  CREATININE 0.59 0.62    Basename 04/23/11 0230 04/22/11 2030  TROPONINI <0.30 <0.30   Hepatic Function Panel  Basename 04/25/11 0650  PROT 7.5  ALBUMIN 3.9  AST 38*  ALT 27  ALKPHOS 96  BILITOT 3.1*  BILIDIR --  IBILI --   No results found for this basename: CHOL in the last 72 hours No results found for this basename: PROTIME in the last 72 hours  Imaging: Imaging results have been reviewed and Ct Head Wo Contrast  04/25/2011  *RADIOLOGY REPORT*  Clinical Data: Confusion and  agitation  CT HEAD WITHOUT CONTRAST  Technique:  Contiguous axial images were obtained from the base of the skull through the vertex without contrast.  Comparison: 12/23/2009  Findings: Ventricle size is normal.  Negative for intracranial hemorrhage. Negative for acute infarct or mass.  Small hypodensity left   caudate may represent chronic ischemia or benign cyst. Calvarium is intact.  IMPRESSION: Negative for acute abnormality.  Original Report Authenticated By: Camelia Phenes, M.D.   Ct Angio Chest W/cm &/or Wo Cm  04/25/2011  *RADIOLOGY REPORT*  Clinical Data: Chest pain, shortness of breath, headache, dizziness, evaluate for pulmonary embolism  CT ANGIOGRAPHY CHEST  Technique:  Multidetector CT imaging of the chest using the standard protocol during bolus administration of intravenous contrast. Multiplanar reconstructed images including MIPs were obtained and reviewed to evaluate the vascular anatomy.  Contrast:  100 ml Omnipaque-300  Comparison: Chest CTA - 12/16/2010  Vascular findings:  There is adequate opacification of the pulmonary arteries with the main pulmonary artery measuring 247 HU (image 47, series five). There is no discrete intraluminal filling defect within the pulmonary arteries to suggest acute pulmonary embolism, no evaluation of the distal subsegmental pulmonary arteries is limited secondary to patient motion artifact.  There is grossly unchanged mild enlargement of the main pulmonary artery, measuring approximately 3.1 cm in greatest transverse axial dimension (image 42).  Unchanged cardiomegaly.  No pericardial effusion.  Right upper extremity approach PICC line tip terminates within the superior  cavoatrial junction.  Normal caliber of the thoracic aorta. Normal configuration of the aortic arch.  Visualized portions of the arch vessels are patent. No periaortic stranding.  Non vascular findings:  Evaluation of the bilateral lung base is obscured secondary to patient motion artifact.   There is minimal bibasilar linear heterogeneous opacities favored to represent subsegmental atelectasis.  No focal airspace opacities.  No pleural effusion or pneumothorax.  Central airways are patent.  Shoddy prevascular lymph nodes are grossly unchanged with index prevascular node measuring approximate 6 mm in short axis diameter (image 32), not enlarged by CT criteria.  Right infrahilar lymph node is grossly unchanged measuring 9 mm in short axis diameter (image 45) also not enlarged by CT criteria.  Shoddy grossly symmetric bilateral axillary lymph nodes grossly unchanged favored to be reactive in etiology.  Evaluation of the upper abdomen is limited to the arterial phase of enhancement.  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  No acute aggressive osseous abnormalities.  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  IMPRESSION: 1.  No acute findings within the chest.  Specifically, no evidence of pulmonary embolism to the level of the bilateral subsegmental pulmonary arteries.  No focal airspace opacities to suggest pneumonia. 2.  Unchanged cardiomegaly and borderline enlargement of the main pulmonary artery, nonspecific but may be seen in the setting of pulmonary arterial hypertension.  Further evaluation may be obtained cardiac echo as clinically indicated. 3.  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  Original Report Authenticated By: Waynard Reeds, M.D.    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

## 2011-04-26 DIAGNOSIS — D571 Sickle-cell disease without crisis: Secondary | ICD-10-CM | POA: Diagnosis not present

## 2011-04-26 DIAGNOSIS — IMO0002 Reserved for concepts with insufficient information to code with codable children: Secondary | ICD-10-CM | POA: Diagnosis not present

## 2011-04-26 DIAGNOSIS — G9341 Metabolic encephalopathy: Secondary | ICD-10-CM | POA: Diagnosis not present

## 2011-04-26 DIAGNOSIS — D5700 Hb-SS disease with crisis, unspecified: Secondary | ICD-10-CM | POA: Diagnosis not present

## 2011-04-26 DIAGNOSIS — D5701 Hb-SS disease with acute chest syndrome: Secondary | ICD-10-CM | POA: Diagnosis not present

## 2011-04-26 DIAGNOSIS — F29 Unspecified psychosis not due to a substance or known physiological condition: Secondary | ICD-10-CM | POA: Diagnosis not present

## 2011-04-26 DIAGNOSIS — I471 Supraventricular tachycardia: Secondary | ICD-10-CM | POA: Diagnosis not present

## 2011-04-26 LAB — COMPREHENSIVE METABOLIC PANEL
ALT: 24 U/L (ref 0–53)
AST: 36 U/L (ref 0–37)
Albumin: 3.9 g/dL (ref 3.5–5.2)
CO2: 24 mEq/L (ref 19–32)
Chloride: 104 mEq/L (ref 96–112)
GFR calc non Af Amer: >90 mL/min (ref >90)
Potassium: 3.8 mEq/L (ref 3.5–5.1)
Sodium: 135 mEq/L (ref 135–145)
Total Bilirubin: 2.4 mg/dL — ABNORMAL HIGH (ref 0.3–1.2)

## 2011-04-26 LAB — CBC
Platelets: 432 10*3/uL — ABNORMAL HIGH (ref 150–400)
RBC: 2.97 MIL/uL — ABNORMAL LOW (ref 4.22–5.81)
RDW: 17.6 % — ABNORMAL HIGH (ref 11.5–15.5)
WBC: 13.9 10*3/uL — ABNORMAL HIGH (ref 4.0–10.5)

## 2011-04-26 MED ORDER — GUAIFENESIN-DM 100-10 MG/5ML PO SYRP
5.0000 mL | ORAL_SOLUTION | Freq: Three times a day (TID) | ORAL | Status: DC
  Administered 2011-04-26 – 2011-04-29 (×9): 5 mL via ORAL
  Filled 2011-04-26 (×12): qty 5

## 2011-04-26 MED ORDER — LEVALBUTEROL HCL 0.63 MG/3ML IN NEBU
0.6300 mg | INHALATION_SOLUTION | Freq: Three times a day (TID) | RESPIRATORY_TRACT | Status: DC
  Administered 2011-04-26 (×2): 0.63 mg via RESPIRATORY_TRACT
  Filled 2011-04-26 (×3): qty 3

## 2011-04-26 NOTE — Progress Notes (Signed)
Subjective: The patient was seen on rounds today.  The patient was sitting on his bed, watching television and had just finished eating his meal tray.  The patient is rating his pain at a 6/10 in his back and left flank. The pain in the patient's right flank area has subsided. The patient still has a productive cough - encouraged the patient to increase the use of his IS.   Discussed the patient's CT results with him.  Will continue to telemetry monitor the patient.  The patient received a blood exchange x 2 yesterday and tolerated the exchanges well.  No other nursing or patient concerns.   Objective: Vital signs in last 24 hours: Blood pressure 117/72, pulse 85, temperature 97.7 F (36.5 C), temperature source Oral, resp. rate 16, height 6' (1.829 m), weight 180 lb 8.9 oz (81.9 kg), SpO2 98.00%.  General Appearance: Alert and oriented, cooperative, well developed, well nourished, mild distress Head: Normocephalic, without obvious abnormality, atraumatic Eyes: PERRLA, EOMI, the patient has slight strabismus, scleral icterus Nose: Nares normal, septum midline, mucosa normal, no drainage or sinus tenderness. Throat:  Normal lips, mucosa, tongue and gums Back: Symmetric, left CVA/flank tenderness, diffuse back tenderness Resp: CTA, Diminished bibasilar breath sounds, no wheezes/rales/rhonchi Cardio: S1, S2 regular, no murmurs/clicks/rubs/gallops GI: Soft, non-tender, normoactive bowel sounds, no masses, no organomegaly Male Genitalia: Deferred, the patient denies priapism Extremities: Extremities normal, atraumatic, no cyanosis or edema, negative Homans sign, no sign of DVT Pulses: 2+ and symmetric Skin: Skin color, texture, turgor normal, no rashes or lesions, numerous tattoos Neurologic: Grossly normal, CN II-XII intact, no focal deficits Psych:  Appropriate affect  Lab Results: Results for orders placed during the hospital encounter of 04/17/11 (from the past 24 hour(s))  CBC     Status:  Abnormal   Collection Time   04/26/11  6:20 AM      Component Value Range   WBC 13.9 (*) 4.0 - 10.5 (K/uL)   RBC 2.97 (*) 4.22 - 5.81 (MIL/uL)   Hemoglobin 8.8 (*) 13.0 - 17.0 (g/dL)   HCT 16.1 (*) 09.6 - 52.0 (%)   MCV 87.5  78.0 - 100.0 (fL)   MCH 29.6  26.0 - 34.0 (pg)   MCHC 33.8  30.0 - 36.0 (g/dL)   RDW 04.5 (*) 40.9 - 15.5 (%)   Platelets 432 (*) 150 - 400 (K/uL)  COMPREHENSIVE METABOLIC PANEL     Status: Abnormal   Collection Time   04/26/11  6:20 AM      Component Value Range   Sodium 135  135 - 145 (mEq/L)   Potassium 3.8  3.5 - 5.1 (mEq/L)   Chloride 104  96 - 112 (mEq/L)   CO2 24  19 - 32 (mEq/L)   Glucose, Bld 108 (*) 70 - 99 (mg/dL)   BUN 7  6 - 23 (mg/dL)   Creatinine, Ser 8.11  0.50 - 1.35 (mg/dL)   Calcium 8.7  8.4 - 91.4 (mg/dL)   Total Protein 7.1  6.0 - 8.3 (g/dL)   Albumin 3.9  3.5 - 5.2 (g/dL)   AST 36  0 - 37 (U/L)   ALT 24  0 - 53 (U/L)   Alkaline Phosphatase 94  39 - 117 (U/L)   Total Bilirubin 2.4 (*) 0.3 - 1.2 (mg/dL)   GFR calc non Af Amer >90  >90 (mL/min)   GFR calc Af Amer >90  >90 (mL/min)    Studies/Results: Ct Head Wo Contrast  04/25/2011  *RADIOLOGY REPORT*  Clinical  Data: Confusion and agitation  CT HEAD WITHOUT CONTRAST  Technique:  Contiguous axial images were obtained from the base of the skull through the vertex without contrast.  Comparison: 12/23/2009  Findings: Ventricle size is normal.  Negative for intracranial hemorrhage. Negative for acute infarct or mass.  Small hypodensity left   caudate may represent chronic ischemia or benign cyst. Calvarium is intact.  IMPRESSION: Negative for acute abnormality.  Original Report Authenticated By: Camelia Phenes, M.D.   Ct Angio Chest W/cm &/or Wo Cm  04/25/2011  *RADIOLOGY REPORT*  Clinical Data: Chest pain, shortness of breath, headache, dizziness, evaluate for pulmonary embolism  CT ANGIOGRAPHY CHEST  Technique:  Multidetector CT imaging of the chest using the standard protocol during bolus  administration of intravenous contrast. Multiplanar reconstructed images including MIPs were obtained and reviewed to evaluate the vascular anatomy.  Contrast:  100 ml Omnipaque-300  Comparison: Chest CTA - 12/16/2010  Vascular findings:  There is adequate opacification of the pulmonary arteries with the main pulmonary artery measuring 247 HU (image 47, series five). There is no discrete intraluminal filling defect within the pulmonary arteries to suggest acute pulmonary embolism, no evaluation of the distal subsegmental pulmonary arteries is limited secondary to patient motion artifact.  There is grossly unchanged mild enlargement of the main pulmonary artery, measuring approximately 3.1 cm in greatest transverse axial dimension (image 42).  Unchanged cardiomegaly.  No pericardial effusion.  Right upper extremity approach PICC line tip terminates within the superior cavoatrial junction.  Normal caliber of the thoracic aorta. Normal configuration of the aortic arch.  Visualized portions of the arch vessels are patent. No periaortic stranding.  Non vascular findings:  Evaluation of the bilateral lung base is obscured secondary to patient motion artifact.  There is minimal bibasilar linear heterogeneous opacities favored to represent subsegmental atelectasis.  No focal airspace opacities.  No pleural effusion or pneumothorax.  Central airways are patent.  Shoddy prevascular lymph nodes are grossly unchanged with index prevascular node measuring approximate 6 mm in short axis diameter (image 32), not enlarged by CT criteria.  Right infrahilar lymph node is grossly unchanged measuring 9 mm in short axis diameter (image 45) also not enlarged by CT criteria.  Shoddy grossly symmetric bilateral axillary lymph nodes grossly unchanged favored to be reactive in etiology.  Evaluation of the upper abdomen is limited to the arterial phase of enhancement.  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  No acute aggressive osseous abnormalities.  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  IMPRESSION: 1.  No acute findings within the chest.  Specifically, no evidence of pulmonary embolism to the level of the bilateral subsegmental pulmonary arteries.  No focal airspace opacities to suggest pneumonia. 2.  Unchanged cardiomegaly and borderline enlargement of the main pulmonary artery, nonspecific but may be seen in the setting of pulmonary arterial hypertension.  Further evaluation may be obtained cardiac echo as clinically indicated. 3.  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  Original Report Authenticated By: Waynard Reeds, M.D.   Medications: I have reviewed the patient's current medications.  Allergies  Allergen Reactions  . Morphine And Related Hives and Rash     Current Facility-Administered Medications  Medication Dose Route Frequency Provider Last Rate Last Dose  . alteplase (ACTIVASE) injection  2 mg  2 mg Intracatheter Once Dorena Bodo, RN   2 mg at 04/25/11 1738  . alteplase (ACTIVASE) injection 2 mg  2 mg Intracatheter Once Dorena Bodo, RN   2 mg at 04/25/11 1740  . deferoxamine (DESFERAL) 2,000 mg in dextrose 5 % 500 mL infusion  2,000 mg Intravenous QHS Keitha Butte, NP 125 mL/hr at 04/26/11 0030 2,000 mg at 04/26/11 0030  . dextrose 5 % and 0.45 % NaCl with KCl 20 mEq/L infusion   Intravenous Continuous Keitha Butte, NP 50 mL/hr at 04/26/11 0336    . diphenhydrAMINE (BENADRYL) injection 25 mg  25 mg Intravenous Q4H PRN Keitha Butte, NP   25 mg at 04/26/11 1050  . enoxaparin (LOVENOX) injection 40 mg  40 mg Subcutaneous Q24H Leroy Sea, MD   40 mg at 04/24/11 1854  . folic acid (FOLVITE) tablet  1 mg  1 mg Oral Daily Leroy Sea, MD   1 mg at 04/26/11 1043  . guaiFENesin-dextromethorphan (ROBITUSSIN DM) 100-10 MG/5ML syrup 5 mL  5 mL Oral Q8H Keitha Butte, NP      . haloperidol (HALDOL) tablet 0.5 mg  0.5 mg Oral Q8H PRN Keitha Butte, NP   0.5 mg at 04/22/11 1635  . HYDROmorphone (DILAUDID) injection 2-4 mg  2-4 mg Intravenous Q2H Keitha Butte, NP   2 mg at 04/26/11 1044  . HYDROmorphone (DILAUDID) tablet 4 mg  4 mg Oral Q2H PRN Keitha Butte, NP      . hydroxyurea (HYDREA) capsule 500 mg  500 mg Oral BID Leroy Sea, MD   500 mg at 04/26/11 1044  . ibuprofen (ADVIL,MOTRIN) tablet 800 mg  800 mg Oral Q6H PRN Leroy Sea, MD      . iohexol (OMNIPAQUE) 300 MG/ML solution 100 mL  100 mL Intravenous Once PRN Medication Radiologist, MD   100 mL at 04/25/11 1337  . levalbuterol (XOPENEX) nebulizer solution 0.63 mg  0.63 mg Nebulization QID PRN Gwenyth Bender, MD      . levalbuterol [facility]) nebulizer solution 0.63 mg  0.63 mg Nebulization Q8H Keitha Butte, NP      . lidocaine (LIDODERM) 5 % 1 patch  1 patch Transdermal Q24H Keitha Butte, NP   1 patch at 04/24/11 1853  . metoprolol tartrate (LOPRESSOR) tablet 25 mg  25 mg Oral BID Robynn Pane, MD   25 mg at 04/26/11 1043  . moxifloxacin (AVELOX) tablet 400 mg  400 mg Oral q1800 Keitha Butte, NP      . naloxone [facility]) injection 0.4 mg  0.4 mg Intravenous PRN Keitha Butte, NP       And  . sodium chloride 0.9 % injection 9 mL  9 mL Intravenous PRN Keitha Butte, NP      . OLANZapine (ZYPREXA) tablet 10 mg  10 mg Oral QHS Leroy Sea, MD   10 mg at 04/25/11 2217  . ondansetron (ZOFRAN) injection 4 mg  4 mg Intravenous Q4H PRN Keitha Butte, NP   4 mg at 04/26/11 1044  . oxyCODONE (OXYCONTIN) 12 hr tablet 80 mg  80 mg Oral Q12H Leroy Sea, MD   80 mg at 04/26/11 1042  . phenol (CHLORASEPTIC) mouth spray 1 spray  1 spray Mouth/Throat PRN Keitha Butte, NP   1 spray at 04/22/11 1636  . potassium chloride (K-DUR,KLOR-CON) CR tablet 10 mEq  10 mEq Oral  BID Keitha Butte, NP   10 mEq at 04/26/11 1043  . senna-docusate (Senokot-S) tablet 1 tablet  1 tablet Oral QHS Keitha Butte, NP   1 tablet at 04/25/11 2217  . sodium chloride 0.9 % injection 10-40 mL  10-40 mL Intracatheter Q12H Cheri Guppy, MD   10 mL at 04/25/11 2200  . sodium chloride 0.9 % injection 10-40 mL  10-40 mL Intracatheter PRN Cheri Guppy, MD   10 mL at 04/25/11 0649  . sodium chloride 0.9 % injection 3 mL  3 mL Intravenous Q12H Leroy Sea, MD   3 mL at 04/25/11 1000  . zolpidem (AMBIEN) tablet 5 mg  5 mg Oral QHS PRN Jinger Neighbors, NP   5 mg at 04/25/11 2217  . DISCONTD: alteplase (CATHFLO ACTIVASE) injection 2 mg  2 mg Intracatheter Once Gwenyth Bender, MD      . DISCONTD: alteplase (CATHFLO ACTIVASE) injection 2 mg  2 mg Intracatheter Once Gwenyth Bender, MD      . DISCONTD: guaiFENesin-dextromethorphan (ROBITUSSIN DM) 100-10 MG/5ML syrup 5 mL  5 mL Oral Q4H PRN Leroy Sea, MD      . DISCONTD: HYDROmorphone (DILAUDID) injection 2-4 mg  2-4 mg Intravenous Q4H PRN Keitha Butte, NP   2 mg at 04/25/11 1018  . DISCONTD: HYDROmorphone (DILAUDID) injection 2-4 mg  2-4 mg Intravenous Q2H PRN Keitha Butte, NP   4 mg at 04/25/11 1456  . DISCONTD: HYDROmorphone (DILAUDID) PCA injection 0.3 mg/mL   Intravenous Q4H Keitha Butte, NP        Assessment/Plan: Patient Active Problem List  Diagnoses  . Sickle cell pain crisis  . Leukocytosis  . Chronic pain  . Hypokalemia  . Thrombocytosis  . Sickle Chest Syndrome  . Hemochromatosis  . Elevated BNP  . Psychosis  . Metabolic Encephalopathy  . Tachycardia    Sickle Cell Crisis/Chronic Pain:  The patient will continue pain/nausea/pruritis/bowel management, IV hydration, home medications,  DVT prophylaxis. CMP and CBC in the am.  Hemoglobinopathy has been received.  Thrombocytocis:    Platelets are trending downward.  The patient is continuing to receive Hydroxyurea and Lovenox - Will continue to monitor Leukocytosis/Sickle Chest Syndrome/Elevated BNP:  The patient is receiving PO Avelox for antibiotic therapy, increased IS usage is encouraged, nebulizer treatments will be scheduled Q8 x 24 hours, ProBNP is WNL -   Will continue to monitor  Hypokalemia:  WNL - The patient is receiving PO potassium supplementation BID and IVFs with 20 mEq KCL - will continue to monitor Hemochromatosis:  Ferritin level is elevated, the patient will continue nightly Desferal infusions  Psychosis:  The patient has low-dose Haldol available PRN for agitation - will continue to monitor Metablolic Encephalopathy: Ammonia level is WNL - will continue to monitor Tachycardia:  The patient is receiving Metoprolol BID (with holding parameters)   Discussed and agreed upon plan of care with the patient.  Plan of care will be adjusted based on the patient's clinical progress.  The patient may be ready for discharge within the next 48 hours.  Larina Bras, NP-C 04/26/2011, 12:25 PM

## 2011-04-26 NOTE — Progress Notes (Signed)
Brief Nutrition Note  Reason: Length of Stay  Reviewed patient's chart. Patient on Regular diet with documented PO intake 75-100% at meals. No nutrition concerns at this time.  RD to follow for nutrition needs.  Iven Finn [facility] Pager [phone]

## 2011-04-27 DIAGNOSIS — D5700 Hb-SS disease with crisis, unspecified: Secondary | ICD-10-CM | POA: Diagnosis not present

## 2011-04-27 DIAGNOSIS — F29 Unspecified psychosis not due to a substance or known physiological condition: Secondary | ICD-10-CM | POA: Diagnosis not present

## 2011-04-27 DIAGNOSIS — D5701 Hb-SS disease with acute chest syndrome: Secondary | ICD-10-CM | POA: Diagnosis not present

## 2011-04-27 DIAGNOSIS — IMO0002 Reserved for concepts with insufficient information to code with codable children: Secondary | ICD-10-CM | POA: Diagnosis not present

## 2011-04-27 DIAGNOSIS — G9341 Metabolic encephalopathy: Secondary | ICD-10-CM | POA: Diagnosis not present

## 2011-04-27 DIAGNOSIS — I471 Supraventricular tachycardia: Secondary | ICD-10-CM | POA: Diagnosis not present

## 2011-04-27 DIAGNOSIS — D571 Sickle-cell disease without crisis: Secondary | ICD-10-CM | POA: Diagnosis not present

## 2011-04-27 LAB — TYPE AND SCREEN
Antibody Screen: NEGATIVE
Unit division: 0
Unit division: 0

## 2011-04-27 LAB — CBC
HCT: 25.8 % — ABNORMAL LOW (ref 39.0–52.0)
MCH: 29.9 pg (ref 26.0–34.0)
MCV: 87.8 fL (ref 78.0–100.0)
Platelets: 493 10*3/uL — ABNORMAL HIGH (ref 150–400)
RDW: 17.5 % — ABNORMAL HIGH (ref 11.5–15.5)

## 2011-04-27 LAB — COMPREHENSIVE METABOLIC PANEL
ALT: 25 U/L (ref 0–53)
AST: 32 U/L (ref 0–37)
Albumin: 3.8 g/dL (ref 3.5–5.2)
CO2: 26 mEq/L (ref 19–32)
Chloride: 105 mEq/L (ref 96–112)
GFR calc non Af Amer: >90 mL/min (ref >90)
Potassium: 3.9 mEq/L (ref 3.5–5.1)
Sodium: 138 mEq/L (ref 135–145)
Total Bilirubin: 1.9 mg/dL — ABNORMAL HIGH (ref 0.3–1.2)

## 2011-04-27 MED ORDER — LEVALBUTEROL TARTRATE 45 MCG/ACT IN AERO
2.0000 | INHALATION_SPRAY | Freq: Two times a day (BID) | RESPIRATORY_TRACT | Status: DC
  Administered 2011-04-27 – 2011-04-29 (×5): 2 via RESPIRATORY_TRACT
  Filled 2011-04-27: qty 15

## 2011-04-27 NOTE — Progress Notes (Signed)
Subjective:  Patient gradually feeling better. No new complaints. He rates his pain as a 6/10. This is mainly around his lower ribs. He's had no further nocturnal confusion. Denies significant itching of his lower extremities.   Allergies  Allergen Reactions  . Morphine And Related Hives and Rash   Current Facility-Administered Medications  Medication Dose Route Frequency Provider Last Rate Last Dose  . deferoxamine (DESFERAL) 2,000 mg in dextrose 5 % 500 mL infusion  2,000 mg Intravenous QHS Keitha Butte, NP 125 mL/hr at 04/27/11 2120 2,000 mg at 04/27/11 2120  . dextrose 5 % and 0.45 % NaCl with KCl 20 mEq/L infusion   Intravenous Continuous Keitha Butte, NP 50 mL/hr at 04/27/11 1800    . diphenhydrAMINE (BENADRYL) injection 25 mg  25 mg Intravenous Q4H PRN Keitha Butte, NP   25 mg at 04/27/11 1546  . enoxaparin (LOVENOX) injection 40 mg  40 mg Subcutaneous Q24H Leroy Sea, MD   40 mg at 04/27/11 1724  . folic acid (FOLVITE) tablet 1 mg  1 mg Oral Daily Leroy Sea, MD   1 mg at 04/27/11 1015  . guaiFENesin-dextromethorphan (ROBITUSSIN DM) 100-10 MG/5ML syrup 5 mL  5 mL Oral Q8H Keitha Butte, NP   5 mL at 04/27/11 2115  . haloperidol (HALDOL) tablet 0.5 mg  0.5 mg Oral Q8H PRN Keitha Butte, NP   0.5 mg at 04/22/11 1635  . HYDROmorphone (DILAUDID) injection 2-4 mg  2-4 mg Intravenous Q2H Keitha Butte, NP   4 mg at 04/27/11 2115  . HYDROmorphone (DILAUDID) tablet 4 mg  4 mg Oral Q2H PRN Keitha Butte, NP      . hydroxyurea (HYDREA) capsule 500 mg  500 mg Oral BID Leroy Sea, MD   500 mg at 04/27/11 2116  . ibuprofen (ADVIL,MOTRIN) tablet 800 mg  800 mg Oral Q6H PRN Leroy Sea, MD      . levalbuterol [facility] HFA) inhaler 2 puff  2 puff Inhalation BID Gwenyth Bender, MD   2 puff at 04/27/11 2112  . levalbuterol (XOPENEX) nebulizer solution 0.63 mg  0.63 mg Nebulization QID PRN Gwenyth Bender, MD      . lidocaine (LIDODERM) 5 % 1  patch  1 patch Transdermal Q24H Keitha Butte, NP   1 patch at 04/27/11 1724  . metoprolol tartrate (LOPRESSOR) tablet 25 mg  25 mg Oral BID Robynn Pane, MD   25 mg at 04/27/11 2115  . moxifloxacin (AVELOX) tablet 400 mg  400 mg Oral q1800 Keitha Butte, NP   400 mg at 04/27/11 1725  . naloxone [facility]) injection 0.4 mg  0.4 mg Intravenous PRN Keitha Butte, NP       And  . sodium chloride 0.9 % injection 9 mL  9 mL Intravenous PRN Keitha Butte, NP      . OLANZapine (ZYPREXA) tablet 10 mg  10 mg Oral QHS Leroy Sea, MD   10 mg at 04/26/11 2220  . ondansetron (ZOFRAN) injection 4 mg  4 mg Intravenous Q4H PRN Keitha Butte, NP   4 mg at 04/27/11 1730  . oxyCODONE (OXYCONTIN) 12 hr tablet 80 mg  80 mg Oral Q12H Leroy Sea, MD   80 mg at 04/27/11 2115  . phenol (CHLORASEPTIC) mouth spray 1 spray  1 spray Mouth/Throat PRN Keitha Butte, NP   1 spray at 04/22/11 1636  . potassium chloride (K-DUR,KLOR-CON) CR  tablet 10 mEq  10 mEq Oral BID Keitha Butte, NP   10 mEq at 04/27/11 2117  . senna-docusate (Senokot-S) tablet 1 tablet  1 tablet Oral QHS Keitha Butte, NP   1 tablet at 04/27/11 2117  . sodium chloride 0.9 % injection 10-40 mL  10-40 mL Intracatheter Q12H Cheri Guppy, MD   10 mL at 04/25/11 2200  . sodium chloride 0.9 % injection 10-40 mL  10-40 mL Intracatheter PRN Cheri Guppy, MD   10 mL at 04/26/11 1817  . sodium chloride 0.9 % injection 3 mL  3 mL Intravenous Q12H Leroy Sea, MD   3 mL at 04/25/11 1000  . zolpidem (AMBIEN) tablet 5 mg  5 mg Oral QHS PRN Jinger Neighbors, NP   5 mg at 04/26/11 2232  . DISCONTD: levalbuterol (XOPENEX) nebulizer solution 0.63 mg  0.63 mg Nebulization Q8H Keitha Butte, NP   0.63 mg at 04/26/11 2104    Objective: Blood pressure 112/73, pulse 75, temperature 98.3 F (36.8 C), temperature source Oral, resp. rate 14, height 6' (1.829 m), weight 180 lb 9.6 oz (81.92 kg), SpO2  98.00%.  Well-developed well-nourished black male in no acute distress. HEENT:no sinus tenderness. NECK:no posterior cervical nodes. LUNGS:clear to auscultation. ZO:XWRUEA S1, S2 without S3. Mild tenderness in lower costochondral rib area. ABD:no epigastric tenderness. VWU:JWJXBJYN Homans no edema. NEURO:intact.  Lab results: Results for orders placed during the hospital encounter of 04/17/11 (from the past 48 hour(s))  CBC     Status: Abnormal   Collection Time   04/26/11  6:20 AM      Component Value Range Comment   WBC 13.9 (*) 4.0 - 10.5 (K/uL)    RBC 2.97 (*) 4.22 - 5.81 (MIL/uL)    Hemoglobin 8.8 (*) 13.0 - 17.0 (g/dL)    HCT 82.9 (*) 56.2 - 52.0 (%)    MCV 87.5  78.0 - 100.0 (fL)    MCH 29.6  26.0 - 34.0 (pg)    MCHC 33.8  30.0 - 36.0 (g/dL)    RDW 13.0 (*) 86.5 - 15.5 (%)    Platelets 432 (*) 150 - 400 (K/uL)   COMPREHENSIVE METABOLIC PANEL     Status: Abnormal   Collection Time   04/26/11  6:20 AM      Component Value Range Comment   Sodium 135  135 - 145 (mEq/L)    Potassium 3.8  3.5 - 5.1 (mEq/L)    Chloride 104  96 - 112 (mEq/L)    CO2 24  19 - 32 (mEq/L)    Glucose, Bld 108 (*) 70 - 99 (mg/dL)    BUN 7  6 - 23 (mg/dL)    Creatinine, Ser 7.84  0.50 - 1.35 (mg/dL)    Calcium 8.7  8.4 - 10.5 (mg/dL)    Total Protein 7.1  6.0 - 8.3 (g/dL)    Albumin 3.9  3.5 - 5.2 (g/dL)    AST 36  0 - 37 (U/L)    ALT 24  0 - 53 (U/L)    Alkaline Phosphatase 94  39 - 117 (U/L)    Total Bilirubin 2.4 (*) 0.3 - 1.2 (mg/dL)    GFR calc non Af Amer >90  >90 (mL/min)    GFR calc Af Amer >90  >90 (mL/min)   FERRITIN     Status: Abnormal   Collection Time   04/26/11  6:20 AM      Component Value Range Comment   Ferritin 2469 (*)  22 - 322 (ng/mL) Result confirmed by automatic dilution.  CBC     Status: Abnormal   Collection Time   04/27/11  5:40 AM      Component Value Range Comment   WBC 12.4 (*) 4.0 - 10.5 (K/uL)    RBC 2.94 (*) 4.22 - 5.81 (MIL/uL)    Hemoglobin 8.8 (*) 13.0 -  17.0 (g/dL)    HCT 78.2 (*) 95.6 - 52.0 (%)    MCV 87.8  78.0 - 100.0 (fL)    MCH 29.9  26.0 - 34.0 (pg)    MCHC 34.1  30.0 - 36.0 (g/dL)    RDW 21.3 (*) 08.6 - 15.5 (%)    Platelets 493 (*) 150 - 400 (K/uL)   COMPREHENSIVE METABOLIC PANEL     Status: Abnormal   Collection Time   04/27/11  5:40 AM      Component Value Range Comment   Sodium 138  135 - 145 (mEq/L)    Potassium 3.9  3.5 - 5.1 (mEq/L)    Chloride 105  96 - 112 (mEq/L)    CO2 26  19 - 32 (mEq/L)    Glucose, Bld 102 (*) 70 - 99 (mg/dL)    BUN 6  6 - 23 (mg/dL)    Creatinine, Ser 5.78  0.50 - 1.35 (mg/dL)    Calcium 8.5  8.4 - 10.5 (mg/dL)    Total Protein 7.2  6.0 - 8.3 (g/dL)    Albumin 3.8  3.5 - 5.2 (g/dL)    AST 32  0 - 37 (U/L)    ALT 25  0 - 53 (U/L)    Alkaline Phosphatase 97  39 - 117 (U/L)    Total Bilirubin 1.9 (*) 0.3 - 1.2 (mg/dL)    GFR calc non Af Amer >90  >90 (mL/min)    GFR calc Af Amer >90  >90 (mL/min)     Studies/Results: No results found.  Patient Active Problem List  Diagnoses  . Sickle cell pain crisis  . Leukocytosis  . Chronic pain  . Hypokalemia  . Metabolic acidosis  . Thrombocytosis  . Hemochromatosis  . Hypomagnesemia  . Acute chest syndrome due to sickle cell crisis  . Diarrhea  . Elevated brain natriuretic peptide (BNP) level  . Bronchitis  . Sickle cell anemia  . Acute embolism and thrombosis of right internal jugular vein  . Avascular necrosis  . Metabolic encephalopathy    Impression: Sickle cell crisis. Active hemolysis slowly resolving. Secondary hemachromatosis. Hepatic encephalopathy improved.   Plan: Continue present therapy. Continue chelation therapy. Progress toward discharge.   [name], Nylene Inlow 04/27/2011 10:10 PM

## 2011-04-28 DIAGNOSIS — D571 Sickle-cell disease without crisis: Secondary | ICD-10-CM | POA: Diagnosis not present

## 2011-04-28 DIAGNOSIS — G9341 Metabolic encephalopathy: Secondary | ICD-10-CM | POA: Diagnosis not present

## 2011-04-28 DIAGNOSIS — D5700 Hb-SS disease with crisis, unspecified: Secondary | ICD-10-CM | POA: Diagnosis not present

## 2011-04-28 DIAGNOSIS — D5701 Hb-SS disease with acute chest syndrome: Secondary | ICD-10-CM | POA: Diagnosis not present

## 2011-04-28 DIAGNOSIS — F29 Unspecified psychosis not due to a substance or known physiological condition: Secondary | ICD-10-CM | POA: Diagnosis not present

## 2011-04-28 DIAGNOSIS — I471 Supraventricular tachycardia: Secondary | ICD-10-CM | POA: Diagnosis not present

## 2011-04-28 DIAGNOSIS — IMO0002 Reserved for concepts with insufficient information to code with codable children: Secondary | ICD-10-CM | POA: Diagnosis not present

## 2011-04-28 LAB — COMPREHENSIVE METABOLIC PANEL
ALT: 32 U/L (ref 0–53)
Calcium: 8.5 mg/dL (ref 8.4–10.5)
Creatinine, Ser: 0.57 mg/dL (ref 0.50–1.35)
GFR calc Af Amer: >90 mL/min (ref >90)
GFR calc non Af Amer: >90 mL/min (ref >90)
Glucose, Bld: 101 mg/dL — ABNORMAL HIGH (ref 70–99)
Sodium: 137 mEq/L (ref 135–145)
Total Protein: 7.3 g/dL (ref 6.0–8.3)

## 2011-04-28 LAB — CBC
Hemoglobin: 8.7 g/dL — ABNORMAL LOW (ref 13.0–17.0)
MCH: 29.6 pg (ref 26.0–34.0)
MCHC: 33.3 g/dL (ref 30.0–36.0)
MCV: 88.8 fL (ref 78.0–100.0)
Platelets: 544 10*3/uL — ABNORMAL HIGH (ref 150–400)

## 2011-04-28 MED ORDER — METOPROLOL TARTRATE 12.5 MG HALF TABLET
12.5000 mg | ORAL_TABLET | Freq: Two times a day (BID) | ORAL | Status: DC
  Administered 2011-04-28 – 2011-04-29 (×3): 12.5 mg via ORAL
  Filled 2011-04-28 (×4): qty 1

## 2011-04-28 MED ORDER — IBUPROFEN 800 MG PO TABS
800.0000 mg | ORAL_TABLET | Freq: Three times a day (TID) | ORAL | Status: AC
  Administered 2011-04-28 (×3): 800 mg via ORAL
  Filled 2011-04-28: qty 1

## 2011-04-28 NOTE — Progress Notes (Signed)
CARE MANAGEMENT NOTE 04/28/2011  Patient:  [name], [name]   Account Number:  [id number]  Date Initiated:  04/28/2011  Documentation initiated by:  Colleen Can  Subjective/Objective Assessment:   dx sickle cell crisis     Action/Plan:   home upon discharge   Anticipated DC Date:  04/30/2011   Anticipated DC Plan:  HOME/SELF CARE  In-house referral  Clinical Social Worker      DC Planning Services  CM consult      Choice offered to / List presented to:             Status of service:  In process, will continue to follow

## 2011-04-28 NOTE — Progress Notes (Signed)
Subjective: The patient was seen on rounds today.  The patient was laying in bed, watching television.  The patient is rating his pain at a 6/10 in his back and bilateral flank areas. The patient still has a productive cough - encouraged the patient to increase the use of his IS.  The patient states that he will be ready to manage his pain at home tomorrow.   No other nursing or patient concerns.   Objective: Vital signs in last 24 hours: Blood pressure 109/68, pulse 64, temperature 98.7 F (37.1 C), temperature source Oral, resp. rate 18, height 6' (1.829 m), weight 183 lb 3.2 oz (83.1 kg), SpO2 99.00%.  General Appearance: Alert and oriented, cooperative, well developed, well nourished, mild distress Head: Normocephalic, without obvious abnormality, atraumatic Eyes: PERRLA, EOMI, the patient has slight strabismus, scleral icterus Nose: Nares normal, septum midline, mucosa normal, no drainage or sinus tenderness. Throat:  Normal lips, mucosa, tongue and gums Back: Symmetric, bilateral CVA/flank tenderness, diffuse back tenderness is resolving Resp: CTA, Diminished bibasilar breath sounds, no wheezes/rales/rhonchi Cardio: S1, S2 regular, no murmurs/clicks/rubs/gallops GI: Soft, non-tender, normoactive bowel sounds, no masses, no organomegaly Male Genitalia: Deferred, the patient denies priapism Extremities: Extremities normal, atraumatic, no cyanosis or edema, negative Homans sign, no sign of DVT Pulses: 2+ and symmetric Skin: Skin color, texture, turgor normal, no rashes or lesions, numerous tattoos Neurologic: Grossly normal, CN II-XII intact, no focal deficits Psych:  Appropriate affect  Lab Results: Results for orders placed during the hospital encounter of 04/17/11 (from the past 24 hour(s))  CBC     Status: Abnormal   Collection Time   04/28/11  4:26 AM      Component Value Range   WBC 14.5 (*) 4.0 - 10.5 (K/uL)   RBC 2.94 (*) 4.22 - 5.81 (MIL/uL)   Hemoglobin 8.7 (*) 13.0 -  17.0 (g/dL)   HCT 16.1 (*) 09.6 - 52.0 (%)   MCV 88.8  78.0 - 100.0 (fL)   MCH 29.6  26.0 - 34.0 (pg)   MCHC 33.3  30.0 - 36.0 (g/dL)   RDW 04.5 (*) 40.9 - 15.5 (%)   Platelets 544 (*) 150 - 400 (K/uL)  COMPREHENSIVE METABOLIC PANEL     Status: Abnormal   Collection Time   04/28/11  4:26 AM      Component Value Range   Sodium 137  135 - 145 (mEq/L)   Potassium 3.6  3.5 - 5.1 (mEq/L)   Chloride 103  96 - 112 (mEq/L)   CO2 25  19 - 32 (mEq/L)   Glucose, Bld 101 (*) 70 - 99 (mg/dL)   BUN 6  6 - 23 (mg/dL)   Creatinine, Ser 8.11  0.50 - 1.35 (mg/dL)   Calcium 8.5  8.4 - 91.4 (mg/dL)   Total Protein 7.3  6.0 - 8.3 (g/dL)   Albumin 3.8  3.5 - 5.2 (g/dL)   AST 36  0 - 37 (U/L)   ALT 32  0 - 53 (U/L)   Alkaline Phosphatase 98  39 - 117 (U/L)   Total Bilirubin 1.7 (*) 0.3 - 1.2 (mg/dL)   GFR calc non Af Amer >90  >90 (mL/min)   GFR calc Af Amer >90  >90 (mL/min)    Studies/Results: No results found. Medications: I have reviewed the patient's current medications.  Allergies  Allergen Reactions  . Morphine And Related Hives and Rash     Current Facility-Administered Medications  Medication Dose Route Frequency Provider Last Rate Last Dose  .  deferoxamine (DESFERAL) 2,000 mg in dextrose 5 % 500 mL infusion  2,000 mg Intravenous QHS Keitha Butte, NP 125 mL/hr at 04/27/11 2120 2,000 mg at 04/27/11 2120  . dextrose 5 % and 0.45 % NaCl with KCl 20 mEq/L infusion   Intravenous Continuous Keitha Butte, NP 50 mL/hr at 04/27/11 2120    . diphenhydrAMINE (BENADRYL) injection 25 mg  25 mg Intravenous Q4H PRN Keitha Butte, NP   25 mg at 04/28/11 0410  . enoxaparin (LOVENOX) injection 40 mg  40 mg Subcutaneous Q24H Leroy Sea, MD   40 mg at 04/27/11 1724  . folic acid (FOLVITE) tablet 1 mg  1 mg Oral Daily Leroy Sea, MD   1 mg at 04/27/11 1015  . guaiFENesin-dextromethorphan (ROBITUSSIN DM) 100-10 MG/5ML syrup 5 mL  5 mL Oral Q8H Keitha Butte, NP   5 mL at  04/28/11 0602  . haloperidol (HALDOL) tablet 0.5 mg  0.5 mg Oral Q8H PRN Keitha Butte, NP   0.5 mg at 04/22/11 1635  . HYDROmorphone (DILAUDID) injection 2-4 mg  2-4 mg Intravenous Q2H Keitha Butte, NP   4 mg at 04/28/11 0810  . HYDROmorphone (DILAUDID) tablet 4 mg  4 mg Oral Q2H PRN Keitha Butte, NP      . hydroxyurea (HYDREA) capsule 500 mg  500 mg Oral BID Leroy Sea, MD   500 mg at 04/27/11 2116  . ibuprofen (ADVIL,MOTRIN) tablet 800 mg  800 mg Oral Q6H PRN Leroy Sea, MD      . ibuprofen (ADVIL,MOTRIN) tablet 800 mg  800 mg Oral TID Keitha Butte, NP      . levalbuterol Pauline Aus HFA) inhaler 2 puff  2 puff Inhalation BID Gwenyth Bender, MD   2 puff at 04/28/11 [phone]  . levalbuterol (XOPENEX) nebulizer solution 0.63 mg  0.63 mg Nebulization QID PRN Gwenyth Bender, MD      . lidocaine (LIDODERM) 5 % 1 patch  1 patch Transdermal Q24H Keitha Butte, NP   1 patch at 04/27/11 1724  . metoprolol tartrate (LOPRESSOR) tablet 12.5 mg  12.5 mg Oral BID Keitha Butte, NP      . moxifloxacin (AVELOX) tablet 400 mg  400 mg Oral q1800 Keitha Butte, NP   400 mg at 04/27/11 1725  . naloxone [facility]) injection 0.4 mg  0.4 mg Intravenous PRN Keitha Butte, NP       And  . sodium chloride 0.9 % injection 9 mL  9 mL Intravenous PRN Keitha Butte, NP      . OLANZapine (ZYPREXA) tablet 10 mg  10 mg Oral QHS Leroy Sea, MD   10 mg at 04/26/11 2220  . ondansetron (ZOFRAN) injection 4 mg  4 mg Intravenous Q4H PRN Keitha Butte, NP   4 mg at 04/28/11 0411  . oxyCODONE (OXYCONTIN) 12 hr tablet 80 mg  80 mg Oral Q12H Leroy Sea, MD   80 mg at 04/27/11 2115  . phenol (CHLORASEPTIC) mouth spray 1 spray  1 spray Mouth/Throat PRN Keitha Butte, NP   1 spray at 04/22/11 1636  . potassium chloride (K-DUR,KLOR-CON) CR tablet 10 mEq  10 mEq Oral BID Keitha Butte, NP   10 mEq at 04/27/11 2117  . senna-docusate (Senokot-S) tablet 1  tablet  1 tablet Oral QHS Keitha Butte, NP   1 tablet at 04/27/11 2117  . sodium chloride 0.9 % injection  10-40 mL  10-40 mL Intracatheter Q12H Cheri Guppy, MD   10 mL at 04/25/11 2200  . sodium chloride 0.9 % injection 10-40 mL  10-40 mL Intracatheter PRN Cheri Guppy, MD   10 mL at 04/28/11 0428  . sodium chloride 0.9 % injection 3 mL  3 mL Intravenous Q12H Leroy Sea, MD   3 mL at 04/25/11 1000  . zolpidem (AMBIEN) tablet 5 mg  5 mg Oral QHS PRN Jinger Neighbors, NP   5 mg at 04/26/11 2232  . DISCONTD: metoprolol tartrate (LOPRESSOR) tablet 25 mg  25 mg Oral BID Robynn Pane, MD   25 mg at 04/27/11 2115    Assessment/Plan: Patient Active Problem List  Diagnoses  . Sickle cell pain crisis  . Leukocytosis  . Chronic pain  . Hypokalemia  . Thrombocytosis  . Sickle Chest Syndrome  . Hemochromatosis  . Elevated BNP  . Psychosis  . Metabolic Encephalopathy  . Tachycardia    Sickle Cell Crisis/Chronic Pain:  The patient will continue pain/nausea/pruritis/bowel management, IV hydration, home medications,  DVT prophylaxis. CMP and CBC in the am.  The patient will have Ibuprofen added to pain regimen for the next 24 hours. Thrombocytocis:   The patient is continuing to receive Hydroxyurea and Lovenox - Will continue to monitor Leukocytosis/Sickle Chest Syndrome/Elevated BNP:  The patient is receiving PO Avelox for antibiotic therapy, increased IS usage is encouraged -   Will continue to monitor  Hypokalemia:  WNL - The patient is receiving PO potassium supplementation BID and IVFs with 20 mEq KCL - will continue to monitor Hemochromatosis:  The patient will continue nightly Desferal infusions  Psychosis:  The patient has low-dose Haldol available PRN for agitation - will continue to monitor Metablolic Encephalopathy: Ammonia level is WNL - will continue to monitor Tachycardia:  The patient is receiving Metoprolol BID (with holding parameters).  The patient's dose has  been decreased to 12.5 mg PO BID   Discussed and agreed upon plan of care with the patient.  Plan of care will be adjusted based on the patient's clinical progress.  The patient may be ready for discharge within the next 24 hours.  Gerald Bras, NP-C 04/28/2011, 10:00 AM

## 2011-04-29 DIAGNOSIS — F29 Unspecified psychosis not due to a substance or known physiological condition: Secondary | ICD-10-CM | POA: Diagnosis not present

## 2011-04-29 DIAGNOSIS — IMO0002 Reserved for concepts with insufficient information to code with codable children: Secondary | ICD-10-CM | POA: Diagnosis not present

## 2011-04-29 DIAGNOSIS — D5700 Hb-SS disease with crisis, unspecified: Secondary | ICD-10-CM | POA: Diagnosis not present

## 2011-04-29 DIAGNOSIS — G9341 Metabolic encephalopathy: Secondary | ICD-10-CM | POA: Diagnosis not present

## 2011-04-29 DIAGNOSIS — I471 Supraventricular tachycardia: Secondary | ICD-10-CM | POA: Diagnosis not present

## 2011-04-29 DIAGNOSIS — D571 Sickle-cell disease without crisis: Secondary | ICD-10-CM | POA: Diagnosis not present

## 2011-04-29 DIAGNOSIS — D5701 Hb-SS disease with acute chest syndrome: Secondary | ICD-10-CM | POA: Diagnosis not present

## 2011-04-29 LAB — COMPREHENSIVE METABOLIC PANEL
ALT: 35 U/L (ref 0–53)
Alkaline Phosphatase: 89 U/L (ref 39–117)
BUN: 6 mg/dL (ref 6–23)
CO2: 24 mEq/L (ref 19–32)
Chloride: 105 mEq/L (ref 96–112)
GFR calc Af Amer: >90 mL/min (ref >90)
GFR calc non Af Amer: >90 mL/min (ref >90)
Glucose, Bld: 105 mg/dL — ABNORMAL HIGH (ref 70–99)
Potassium: 3.8 mEq/L (ref 3.5–5.1)
Sodium: 137 mEq/L (ref 135–145)
Total Bilirubin: 1.5 mg/dL — ABNORMAL HIGH (ref 0.3–1.2)
Total Protein: 7 g/dL (ref 6.0–8.3)

## 2011-04-29 LAB — CBC
HCT: 25 % — ABNORMAL LOW (ref 39.0–52.0)
Hemoglobin: 8.3 g/dL — ABNORMAL LOW (ref 13.0–17.0)
MCHC: 33.2 g/dL (ref 30.0–36.0)
RBC: 2.8 MIL/uL — ABNORMAL LOW (ref 4.22–5.81)

## 2011-04-29 MED ORDER — ZOLPIDEM TARTRATE 10 MG PO TABS: 10.0000 mg | ORAL_TABLET | Freq: Every evening | ORAL | Status: DC | PRN

## 2011-04-29 MED ORDER — HYDROMORPHONE HCL 4 MG PO TABS: 4.0000 mg | ORAL_TABLET | ORAL | Status: DC | PRN

## 2011-04-29 MED ORDER — IBUPROFEN 800 MG PO TABS: 800.0000 mg | ORAL_TABLET | Freq: Three times a day (TID) | ORAL | Status: DC | PRN

## 2011-04-29 MED ORDER — MOXIFLOXACIN HCL 400 MG PO TABS: 400.0000 mg | ORAL_TABLET | Freq: Every day | ORAL | Status: AC

## 2011-04-29 MED ORDER — OXYCODONE HCL 80 MG PO TB12: 80.0000 mg | ORAL_TABLET | Freq: Two times a day (BID) | ORAL | Status: DC

## 2011-04-29 MED ORDER — MOXIFLOXACIN HCL 400 MG PO TABS: 400.0000 mg | ORAL_TABLET | Freq: Every day | ORAL | Status: DC

## 2011-04-29 NOTE — Discharge Summary (Signed)
[facility] Discharge Summary  Patient ID: [name] MRN: [id number] DOB/AGE: 1980/03/18 31 y.o.  Admit date: 04/17/2011 Discharge date: 04/29/2011  Admission Diagnoses: Sickle cell crisis  Hypokalemia  History of smoking  Leukocytosis with thrombocytosis   Discharge Diagnoses: Sickle Cell Crisis Chronic Pain Thrombocytocis Leukocytosis Sickle Chest Syndrome Elevated BNP Hemochromatosis Psychosis Metablolic Encephalopathy Tachycardia  Discharged Condition: Stable  Hospital Course:  Please refer to the detailed H&P for information surrounding this patient's admission.  In short, [name] is a 31 year-old African-American male, with history of sickle cell disease.  The patient presented to the ER with an episode of bilateral rib cage pain in the flank area along with bilateral lower extremity pain. Upon admission the patient received aggressive pain/nausea/pruritis/bowel management, IV hydration, PICC line insertion, blood exchange/transfusion, iron chelation therapy,  potassium repletion and antibiotic therapy.  The patient had a run of SVT and a subsequent EKG showed changes.  Dr. Sharyn Lull, Cardiologist was consulted.  The patient received a short course of Metoprolol and was cleared by Cardiologist.  The patient did not experience any chest pain during his hospitalization.  The patient also experienced several episodes of nocturnal confusion, agitation and hallucinations.  The patient's ammonia level was found to be elevated and Lactulose was administered. A CT of the patient's head was performed and was unremarkable. The patient returned to his baseline mental status. The patient also had unrelenting bilateral flank pain during his hospitalization and has a history of PE.  A CTA of the chest was performed and was relatively unremarkable.  The patient states that he is ready to manage his pain at home and is requesting discharge.  The patient will be  discharge to home at this time in stable condition.  Consults:  Dr. Sharyn Lull, Cardiology  Significant Diagnostic Studies:  Dg Chest 2 View 04/17/2011  *RADIOLOGY REPORT*  Clinical Data: Sickle cell crisis  CHEST - 2 VIEW  Comparison: Chest radiograph 04/13/2011  Findings: Stable cardiac silhouette.  There is no focal consolidation.  No pleural fluid.  There is a chronic bronchitic markings centrally unchanged from prior.  No osseous abnormality. Mild atelectasis at the right lung base is not changed.  IMPRESSION:  1.  No interval change. 2.  No evidence of pneumonia. 3.  chronic bronchitic change in atelectasis.  Original Report Authenticated By: Genevive Bi, M.D.   Ct Head Wo Contrast 04/25/2011  *RADIOLOGY REPORT*  Clinical Data: Confusion and agitation  CT HEAD WITHOUT CONTRAST  Technique:  Contiguous axial images were obtained from the base of the skull through the vertex without contrast.  Comparison: 12/23/2009  Findings: Ventricle size is normal.  Negative for intracranial hemorrhage. Negative for acute infarct or mass.  Small hypodensity left   caudate may represent chronic ischemia or benign cyst. Calvarium is intact.  IMPRESSION: Negative for acute abnormality.  Original Report Authenticated By: Camelia Phenes, M.D.   Ct Angio Chest W/cm &/or Wo Cm 04/25/2011  *RADIOLOGY REPORT*  Clinical Data: Chest pain, shortness of breath, headache, dizziness, evaluate for pulmonary embolism  CT ANGIOGRAPHY CHEST  Technique:  Multidetector CT imaging of the chest using the standard protocol during bolus administration of intravenous contrast. Multiplanar reconstructed images including MIPs were obtained and reviewed to evaluate the vascular anatomy.  Contrast:  100 ml Omnipaque-300  Comparison: Chest CTA - 12/16/2010  Vascular findings:  There is adequate opacification of the pulmonary arteries with the main pulmonary artery measuring 247 HU (image 47, series five). There  is no discrete intraluminal filling  defect within the pulmonary arteries to suggest acute pulmonary embolism, no evaluation of the distal subsegmental pulmonary arteries is limited secondary to patient motion artifact.  There is grossly unchanged mild enlargement of the main pulmonary artery, measuring approximately 3.1 cm in greatest transverse axial dimension (image 42).  Unchanged cardiomegaly.  No pericardial effusion.  Right upper extremity approach PICC line tip terminates within the superior cavoatrial junction.  Normal caliber of the thoracic aorta. Normal configuration of the aortic arch.  Visualized portions of the arch vessels are patent. No periaortic stranding.  Non vascular findings:  Evaluation of the bilateral lung base is obscured secondary to patient motion artifact.  There is minimal bibasilar linear heterogeneous opacities favored to represent subsegmental atelectasis.  No focal airspace opacities.  No pleural effusion or pneumothorax.  Central airways are patent.  Shoddy prevascular lymph nodes are grossly unchanged with index prevascular node measuring approximate 6 mm in short axis diameter (image 32), not enlarged by CT criteria.  Right infrahilar lymph node is grossly unchanged measuring 9 mm in short axis diameter (image 45) also not enlarged by CT criteria.  Shoddy grossly symmetric bilateral axillary lymph nodes grossly unchanged favored to be reactive in etiology.  Evaluation of the upper abdomen is limited to the arterial phase of enhancement.  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  No acute aggressive osseous abnormalities.  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  IMPRESSION: 1.  No acute findings within the chest.  Specifically, no evidence of pulmonary embolism to the level of the bilateral subsegmental pulmonary arteries.  No  focal airspace opacities to suggest pneumonia. 2.  Unchanged cardiomegaly and borderline enlargement of the main pulmonary artery, nonspecific but may be seen in the setting of pulmonary arterial hypertension.  Further evaluation may be obtained cardiac echo as clinically indicated. 3.  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  Original Report Authenticated By: Waynard Reeds, M.D.   Dg Chest Port 1 View 04/17/2011  *RADIOLOGY REPORT*  Clinical Data: PICC line placement  PORTABLE CHEST - 1 VIEW  Comparison: Plain films 04/17/2011  Findings: Interval placement right PICC line tip in distal SVC. Heart is mildly enlarged.  There is mild central venous congestion. No focal consolidation.  No pneumothorax.  IMPRESSION: Interval placement right PICC line good position.  Central venous congestion.  Original Report Authenticated By: Genevive Bi, M.D.    2D Echocardiogram without contrast      Result Narrative     ------------------------------------------------------------ Echocardiography  ----------------------------------------------------------- LV EF: 60% - 65%  ------------------------------------------------------------ Indications: Abnormal EKG 794.31.  ------------------------------------------------------------ History: Risk factors: Sickle Cell  ------------------------------------------------------------ Study Conclusions  - Left ventricle: The cavity size was normal. Wall thickness was increased in a pattern of mild LVH. Systolic function was normal. The estimated ejection fraction was in the range of 60% to 65%. Wall motion was normal; there were no regional wall motion abnormalities. Left ventricular diastolic function parameters were normal. - Mitral valve: Systolic bowing without prolapse. - Pulmonary arteries: PA peak pressure: 38mm Hg (S). Impressions:  - The right  ventricular systolic pressure was increased consistent with mild pulmonary hypertension. Echocardiography. M-mode, complete 2D, spectral Doppler, and color Doppler. Height: Height: 182.9cm. Height: 72in. Weight: Weight: 81.8kg. Weight: 180lb. Body mass  index: BMI: 24.5kg/m^2. Body surface area: BSA: 2.104m^2. Blood pressure: 134/87. Patient status: Inpatient. Location: Bedside.    Treatments:  IV hydration Antibiotics: Avelox Analgesia: Dilaudid Cardiac meds: Metoprolol Anticoagulation: Lovenox Respiratory therapy: O2 and Nebulizer treatments Procedures: PICC line  Blood transfusion/exchange Iron chelation  Discharge Exam: Blood pressure 112/69, pulse 66, temperature 98.6 F (37 C), temperature source Oral, resp. rate 18, height 6' (1.829 m), weight 184 lb 4.9 oz (83.6 kg), SpO2 99.00%.  General Appearance: Alert and oriented, cooperative, well developed, well nourished, no apparent distress  Head: Normocephalic, without obvious abnormality, atraumatic  Eyes: PERRLA, EOMI, the patient has slight strabismus, scleral icterus  Nose: Nares normal, septum midline, mucosa normal, no drainage or sinus tenderness.  Throat: Normal lips, mucosa, tongue and gums  Back: Symmetric, bilateral CVA/flank tenderness, diffuse back tenderness resolved  Resp: CTA, Diminished bibasilar breath sounds, no wheezes/rales/rhonchi  Cardio: S1, S2 regular, no murmurs/clicks/rubs/gallops  GI: Soft, non-tender, normoactive bowel sounds, no masses, no organomegaly  Male Genitalia: Deferred, the patient denies priapism  Extremities: Extremities normal, atraumatic, no cyanosis or edema, negative Homans sign, no sign of DVT  Pulses: 2+ and symmetric  Skin: Skin color, texture, turgor normal, no rashes or lesions, numerous tattoos  Neurologic: Grossly normal, CN II-XII intact, no focal deficits  Psych: Appropriate affect   Disposition: 01-Home or Self Care  Discharge Orders    Future Orders Please Complete  By Expires   Increase activity slowly      Discharge instructions      Comments:   Take all medications as prescribed Keep warm Drink plenty of fluids Get plenty of rest Use incentive spirometer daily Finish entire course of antibiotic therapy Keep all follow up appointments     Medication List  As of 04/29/2011  1:50 PM   STOP taking these medications         HYDROcodone-acetaminophen 5-500 MG per tablet         TAKE these medications         folic acid 1 MG tablet   Commonly known as: FOLVITE   Take 1 tablet (1 mg total) by mouth daily.      HYDROmorphone 4 MG tablet   Commonly known as: DILAUDID   Take 1 tablet (4 mg total) by mouth every 4 (four) hours as needed. For pain      hydroxyurea 500 MG capsule   Commonly known as: HYDREA   Take 500 mg by mouth 2 (two) times daily. May take with food to minimize GI side effects.      ibuprofen 800 MG tablet   Commonly known as: ADVIL,MOTRIN   Take 1 tablet (800 mg total) by mouth every 8 (eight) hours as needed. For pain.      moxifloxacin 400 MG tablet   Commonly known as: AVELOX   Take 1 tablet (400 mg total) by mouth daily at 6 PM.      OLANZapine 10 MG tablet   Commonly known as: ZYPREXA   Take 10 mg by mouth at bedtime.      oxyCODONE 80 MG 12 hr tablet   Commonly known as: OXYCONTIN   Take 1 tablet (80 mg total) by mouth every 12 (twelve) hours. pain      potassium chloride SA 20 MEQ tablet   Commonly known as: K-DUR,KLOR-CON   Take 1 tablet (20 mEq total) by mouth daily.      zolpidem 10 MG tablet   Commonly known as: AMBIEN   Take 1  tablet (10 mg total) by mouth at bedtime as needed. For sleep.           Follow-up Information    Follow up with [name], MD. Schedule an appointment as soon as possible for a visit in 1 month.   Contact information:   [address] [place] [address] [phone]       Follow up with [name], [name], MD. Schedule an appointment as soon as  possible for a visit in 1 week. (Go to the ER or the [facility] if symptoms worsen)    Contact information:   [address] [facility] [place] [address] [phone]         NOTE:  Avelox was changed to Levaquin 750 mg PO x 5 days due to insurance medication coverage issues encountered by the patient at the pharmacy.  Discharge Time:  Greater than 30 minutes  Signed: Larina Bras 04/29/2011, 1:50 PM

## 2011-05-15 IMAGING — CR DG CHEST 2V
2 series · 2 of 2 positions shown · non-contrast
Comparison: 04/17/2008

CLINICAL DATA: Sickle cell crisis, chest pain

CHEST - 2 VIEW

[w chest pa]
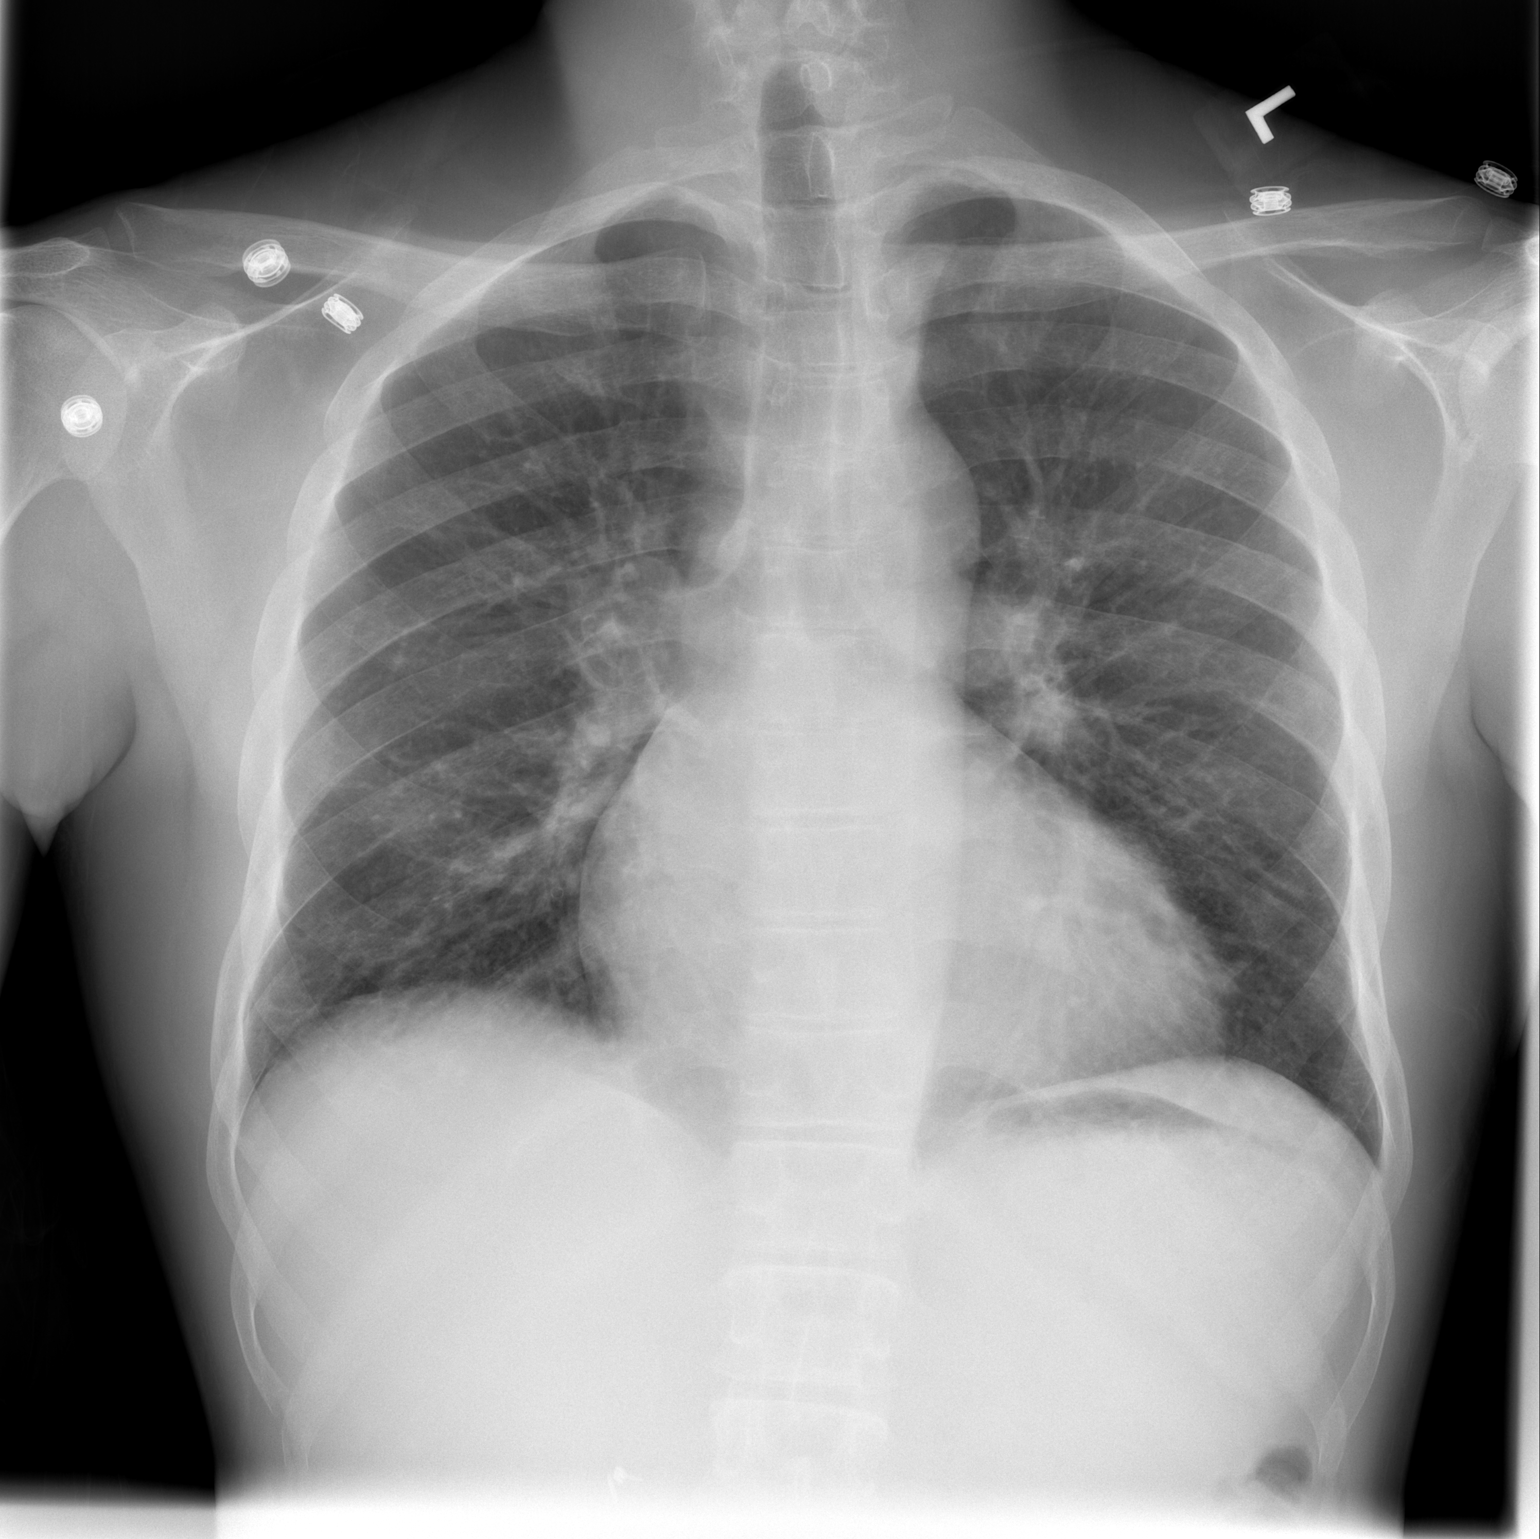

[w chest lat]
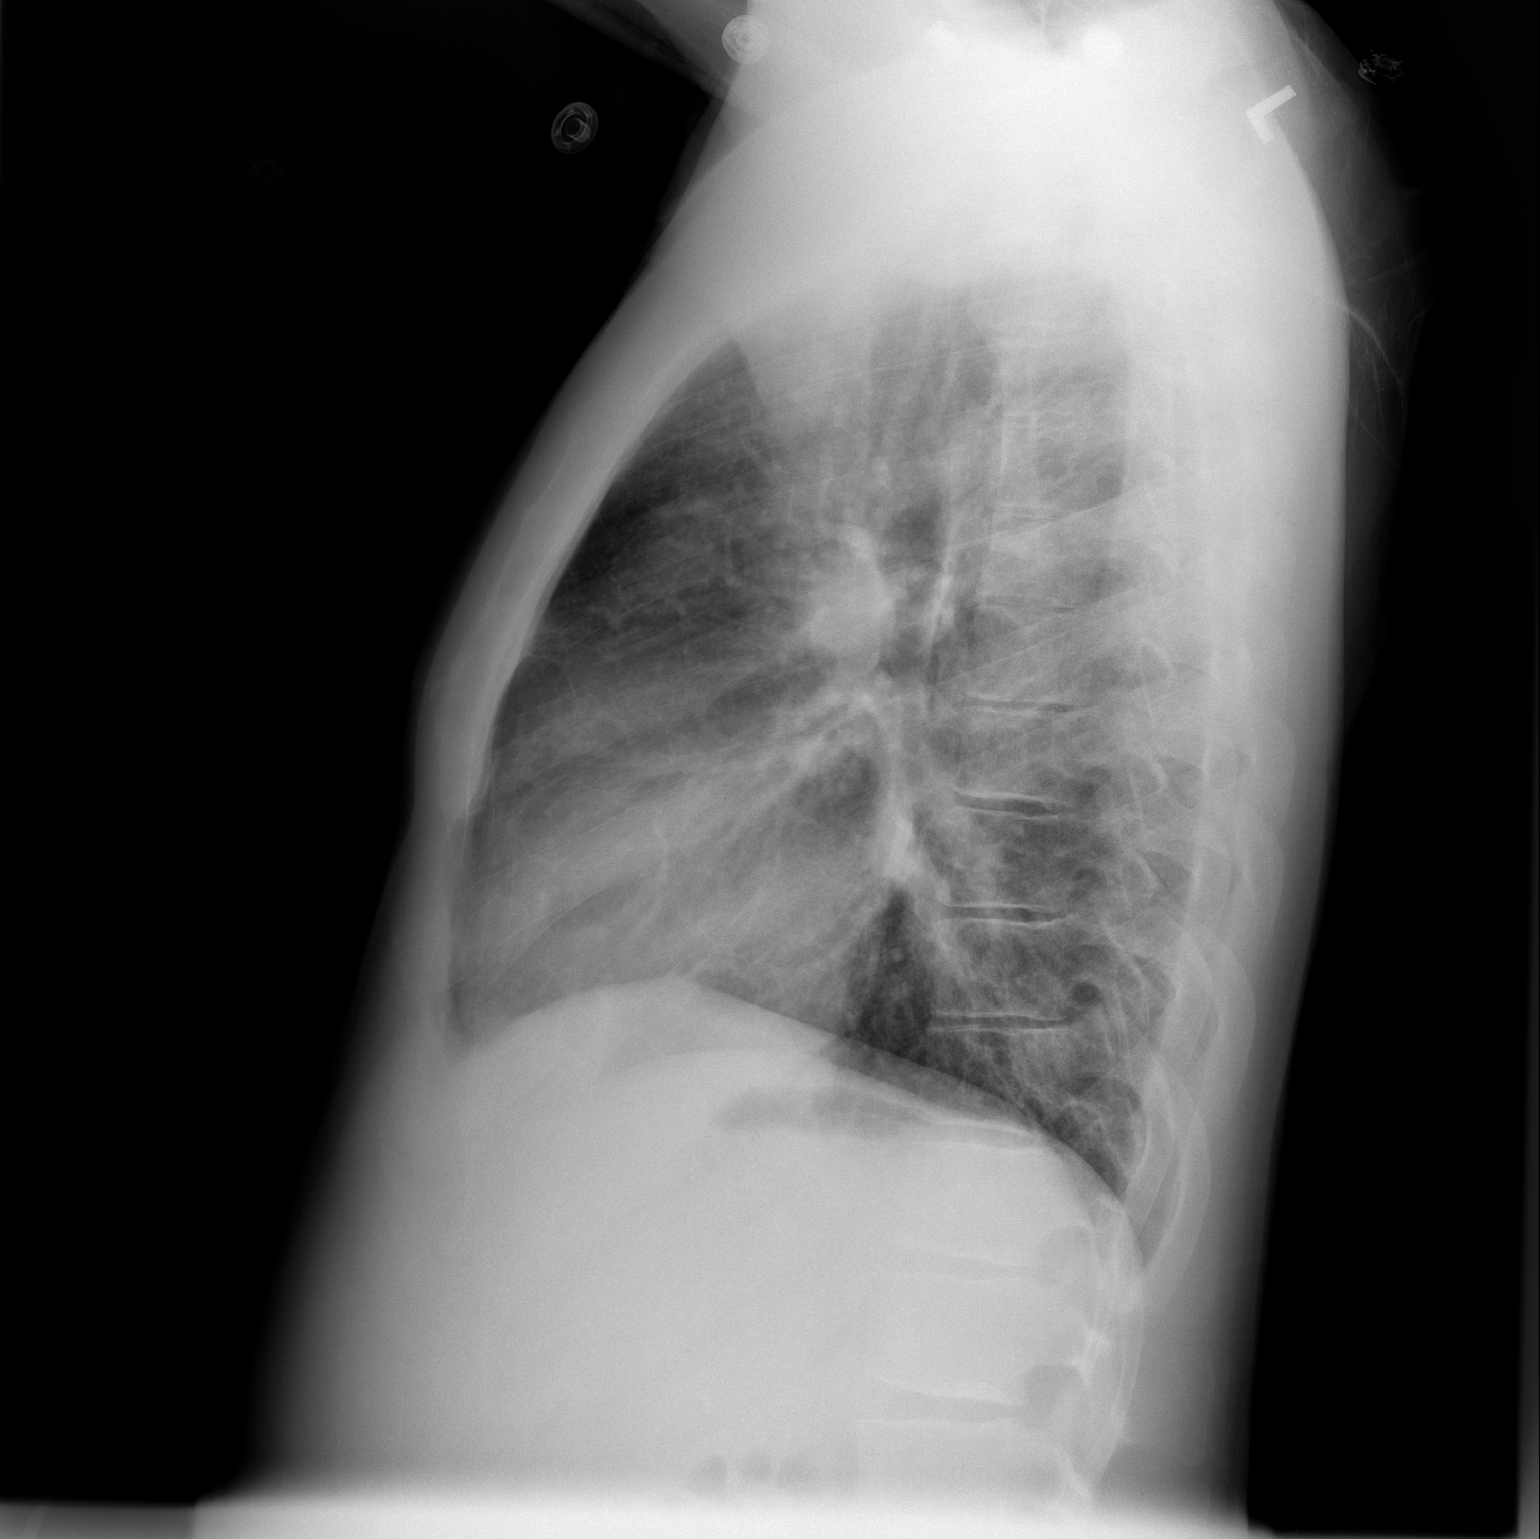

[2 of 2 positions shown; findings below may reference images not displayed]

FINDINGS: Stable cardiomegaly and basilar bronchitic changes versus
scarring.  No definite superimposed pneumonia, consolidation,
edema, effusion or pneumothorax.  Midline trachea.  Previous
cholecystectomy noted.
IMPRESSION: Stable mild cardiomegaly and basilar bronchitic changes versus
scarring

## 2011-05-17 ENCOUNTER — Encounter (HOSPITAL_COMMUNITY): Payer: Self-pay

## 2011-05-17 ENCOUNTER — Non-Acute Institutional Stay (HOSPITAL_COMMUNITY)
Admission: AD | Admit: 2011-05-17 | Discharge: 2011-05-17 | Disposition: A | Discharge status: Home / Self Care | Payer: Medicare Other | Attending: Internal Medicine | Admitting: Internal Medicine

## 2011-05-17 ENCOUNTER — Encounter (HOSPITAL_COMMUNITY): Payer: Self-pay | Admitting: Emergency Medicine

## 2011-05-17 ENCOUNTER — Emergency Department (HOSPITAL_COMMUNITY)
Admission: EM | Admit: 2011-05-17 | Discharge: 2011-05-17 | Disposition: A | Discharge status: Home / Self Care | Payer: Medicare Other | Attending: Emergency Medicine | Admitting: Emergency Medicine

## 2011-05-17 ENCOUNTER — Telehealth (HOSPITAL_COMMUNITY): Payer: Self-pay

## 2011-05-17 DIAGNOSIS — Z86718 Personal history of other venous thrombosis and embolism: Secondary | ICD-10-CM | POA: Insufficient documentation

## 2011-05-17 DIAGNOSIS — Z79899 Other long term (current) drug therapy: Secondary | ICD-10-CM | POA: Insufficient documentation

## 2011-05-17 DIAGNOSIS — D5700 Hb-SS disease with crisis, unspecified: Secondary | ICD-10-CM

## 2011-05-17 DIAGNOSIS — D473 Essential (hemorrhagic) thrombocythemia: Secondary | ICD-10-CM | POA: Insufficient documentation

## 2011-05-17 DIAGNOSIS — D72829 Elevated white blood cell count, unspecified: Secondary | ICD-10-CM | POA: Insufficient documentation

## 2011-05-17 DIAGNOSIS — Z86711 Personal history of pulmonary embolism: Secondary | ICD-10-CM | POA: Diagnosis not present

## 2011-05-17 DIAGNOSIS — E876 Hypokalemia: Secondary | ICD-10-CM | POA: Diagnosis not present

## 2011-05-17 LAB — CBC
HCT: 31.8 % — ABNORMAL LOW (ref 39.0–52.0)
MCHC: 33 g/dL (ref 30.0–36.0)
Platelets: 622 10*3/uL — ABNORMAL HIGH (ref 150–400)
RDW: 17.2 % — ABNORMAL HIGH (ref 11.5–15.5)
WBC: 23.5 10*3/uL — ABNORMAL HIGH (ref 4.0–10.5)

## 2011-05-17 LAB — LIPASE, BLOOD: Lipase: 46 U/L (ref 11–59)

## 2011-05-17 LAB — COMPREHENSIVE METABOLIC PANEL
AST: 21 U/L (ref 0–37)
Albumin: 4.8 g/dL (ref 3.5–5.2)
BUN: 5 mg/dL — ABNORMAL LOW (ref 6–23)
Calcium: 9.6 mg/dL (ref 8.4–10.5)
Chloride: 105 mEq/L (ref 96–112)
Creatinine, Ser: 0.72 mg/dL (ref 0.50–1.35)
Total Bilirubin: 2.6 mg/dL — ABNORMAL HIGH (ref 0.3–1.2)

## 2011-05-17 LAB — RETICULOCYTES
RBC.: 3.53 MIL/uL — ABNORMAL LOW (ref 4.22–5.81)
Retic Count, Absolute: 264.8 10*3/uL — ABNORMAL HIGH (ref 19.0–186.0)
Retic Ct Pct: 7.5 % — ABNORMAL HIGH (ref 0.4–3.1)

## 2011-05-17 MED ORDER — OXYCODONE HCL 80 MG PO TB12: 80.0000 mg | ORAL_TABLET | Freq: Two times a day (BID) | ORAL | Status: DC

## 2011-05-17 MED ORDER — ZOLPIDEM TARTRATE 10 MG PO TABS: 10.0000 mg | ORAL_TABLET | Freq: Every evening | ORAL | Status: DC | PRN

## 2011-05-17 MED ORDER — HYDROMORPHONE HCL 4 MG PO TABS: 4.0000 mg | ORAL_TABLET | ORAL | Status: DC | PRN

## 2011-05-17 MED ORDER — ONDANSETRON HCL 4 MG PO TABS: 4.0000 mg | ORAL_TABLET | ORAL | Status: DC | PRN

## 2011-05-17 MED ORDER — HYDROMORPHONE HCL PF 2 MG/ML IJ SOLN
2.0000 mg | Freq: Once | INTRAMUSCULAR | Status: AC
  Administered 2011-05-17: 2 mg via INTRAVENOUS
  Filled 2011-05-17: qty 1

## 2011-05-17 MED ORDER — ONDANSETRON HCL 4 MG/2ML IJ SOLN
4.0000 mg | Freq: Once | INTRAMUSCULAR | Status: AC
  Administered 2011-05-17: 4 mg via INTRAVENOUS
  Filled 2011-05-17: qty 2

## 2011-05-17 MED ORDER — HYDROMORPHONE HCL PF 2 MG/ML IJ SOLN
2.0000 mg | INTRAMUSCULAR | Status: DC | PRN
  Administered 2011-05-17 (×5): 4 mg via INTRAVENOUS
  Filled 2011-05-17 (×5): qty 2

## 2011-05-17 MED ORDER — ENOXAPARIN SODIUM 40 MG/0.4ML ~~LOC~~ SOLN
40.0000 mg | SUBCUTANEOUS | Status: DC
  Filled 2011-05-17: qty 0.4

## 2011-05-17 MED ORDER — SODIUM CHLORIDE 0.9 % IV BOLUS (SEPSIS)
1000.0000 mL | Freq: Once | INTRAVENOUS | Status: AC
  Administered 2011-05-17: 1000 mL via INTRAVENOUS

## 2011-05-17 MED ORDER — ONDANSETRON HCL 4 MG/2ML IJ SOLN: 4.0000 mg | INTRAMUSCULAR | Status: DC | PRN

## 2011-05-17 MED ORDER — AZITHROMYCIN 500 MG IV SOLR
500.0000 mg | Freq: Every day | INTRAVENOUS | Status: DC
  Administered 2011-05-17: 500 mg via INTRAVENOUS
  Filled 2011-05-17: qty 500

## 2011-05-17 MED ORDER — POTASSIUM CHLORIDE IN NACL 20-0.45 MEQ/L-% IV SOLN
INTRAVENOUS | Status: DC
  Administered 2011-05-17: 12:00:00 via INTRAVENOUS
  Filled 2011-05-17: qty 1000

## 2011-05-17 MED ORDER — FOLIC ACID 1 MG PO TABS
1.0000 mg | ORAL_TABLET | Freq: Every day | ORAL | Status: DC
  Administered 2011-05-17: 1 mg via ORAL
  Filled 2011-05-17: qty 1

## 2011-05-17 MED ORDER — DEXTROSE-NACL 5-0.45 % IV SOLN
INTRAVENOUS | Status: DC
  Administered 2011-05-17: 09:00:00 via INTRAVENOUS

## 2011-05-17 MED ORDER — SENNA 8.6 MG PO TABS: 1.0000 | ORAL_TABLET | Freq: Two times a day (BID) | ORAL | Status: DC

## 2011-05-17 MED ORDER — DIPHENHYDRAMINE HCL 50 MG/ML IJ SOLN
25.0000 mg | INTRAMUSCULAR | Status: DC | PRN
  Administered 2011-05-17 (×2): via INTRAVENOUS
  Filled 2011-05-17 (×2): qty 1

## 2011-05-17 MED ORDER — POTASSIUM CHLORIDE CRYS ER 20 MEQ PO TBCR
30.0000 meq | EXTENDED_RELEASE_TABLET | Freq: Three times a day (TID) | ORAL | Status: DC
  Administered 2011-05-17: 30 meq via ORAL
  Filled 2011-05-17: qty 2

## 2011-05-17 NOTE — ED Notes (Signed)
IV attempt without success, 2nd RN to attempt

## 2011-05-17 NOTE — ED Provider Notes (Signed)
History     CSN: [id number]  Arrival date & time 05/17/11  1914   First MD Initiated Contact with Patient 05/17/11 [phone]      Chief Complaint  Patient presents with  . Sickle Cell Pain Crisis    (Consider location/radiation/quality/duration/timing/severity/associated sxs/prior treatment) Patient is a 31 y.o. male presenting with sickle cell pain. The history is provided by the patient.  Sickle Cell Pain Crisis  This is a recurrent problem. The current episode started yesterday. The onset was gradual. The problem occurs continuously. The problem has been gradually worsening. The pain is associated with an unknown factor. Pain location: Bilateral inferior and lateral ribs, upper abd, bilateral lower back, bilateral thighs. The pain is similar to prior episodes. The pain is severe. The symptoms are relieved by nothing. The symptoms are aggravated by movement and activity. Associated symptoms include abdominal pain, nausea, vomiting and back pain. Pertinent negatives include no chest pain, no blurred vision, no constipation, no diarrhea, no dysuria, no hematuria, no ear pain, no headaches, no sore throat, no joint pain, no neck pain, no neck stiffness, no loss of sensation, no weakness, no cough, no difficulty breathing, no rash and no eye pain. The vomiting occurs rarely. The emesis has an appearance of stomach contents. There is no swelling present. There is a history of acute chest syndrome. There have been frequent pain crises. There were no sick contacts. Recent Medical Care: last ER visit approx 1 month ago with admission to hospital for pain control.  Pt reports he has run out of his home pain medications and did not take anything prior to ER arrival to relieve his pain. Planned on going to sickle cell clinic but could not wait until they opened.  Past Medical History  Diagnosis Date  . Sickle cell anemia   . Blood transfusion   . Acute embolism and thrombosis of right internal jugular vein    . Hypokalemia   . Mood disorder   . Pulmonary embolism   . Avascular necrosis   . Leukocytosis     Chronic  . Thrombocytosis     Chronic    Past Surgical History  Procedure Date  . Right hip replacement     08/2006  . Cholecystectomy     01/2008  . Porta cath placement   . Porta cath removal   . Umbilical hernia repair     01/2008  . Excision of left periauricular cyst     10/2009  . Excision of right ear lobe cyst with primary closur     11/2007    Family History  Problem Relation Age of Onset  . Sickle cell anemia Mother   . Sickle cell anemia Father   . Sickle cell trait Brother     History  Substance Use Topics  . Smoking status: Former Smoker -- 13 years    Types: Cigarettes    Quit date: 07/08/2010  . Smokeless tobacco: Never Used  . Alcohol Use: No      Review of Systems  Constitutional: Negative for fever and chills.  HENT: Negative for ear pain, sore throat, neck pain and neck stiffness.   Eyes: Negative for blurred vision, pain and visual disturbance.  Respiratory: Negative for cough, chest tightness and shortness of breath.   Cardiovascular: Negative for chest pain.  Gastrointestinal: Positive for nausea, vomiting and abdominal pain. Negative for diarrhea and constipation.  Genitourinary: Negative for dysuria and hematuria.  Musculoskeletal: Positive for back pain. Negative for joint pain.  See HPI, otherwise negative  Skin: Negative for rash.  Neurological: Negative for dizziness, speech difficulty, weakness and headaches.  Psychiatric/Behavioral: Negative for confusion.    Allergies  Morphine and related  Home Medications   Current Outpatient Rx  Name Route Sig Dispense Refill  . FOLIC ACID 1 MG PO TABS Oral Take 1 tablet (1 mg total) by mouth daily. 60 tablet 2  . HYDROMORPHONE HCL 4 MG PO TABS Oral Take 1 tablet (4 mg total) by mouth every 4 (four) hours as needed. For pain 60 tablet 0  . HYDROXYUREA 500 MG PO CAPS Oral Take 500  mg by mouth 2 (two) times daily. May take with food to minimize GI side effects.    . IBUPROFEN 800 MG PO TABS Oral Take 1 tablet (800 mg total) by mouth every 8 (eight) hours as needed. For pain. 30 tablet 0  . OXYCODONE HCL ER 80 MG PO TB12 Oral Take 1 tablet (80 mg total) by mouth every 12 (twelve) hours. pain 40 tablet 0  . POTASSIUM CHLORIDE CRYS ER 20 MEQ PO TBCR Oral Take 1 tablet (20 mEq total) by mouth daily. 30 tablet 1  . ZOLPIDEM TARTRATE 10 MG PO TABS Oral Take 1 tablet (10 mg total) by mouth at bedtime as needed. For sleep. 30 tablet 0  . OLANZAPINE 10 MG PO TABS Oral Take 10 mg by mouth at bedtime as needed. for sleep.    Marland Kitchen OLANZAPINE 10 MG PO TABS Oral Take 1 tablet (10 mg total) by mouth at bedtime. 30 tablet 1    BP 115/75  Pulse 67  Temp(Src) 97.9 F (36.6 C) (Oral)  Resp 19  Ht 6' (1.829 m)  Wt 170 lb (77.111 kg)  BMI 23.06 kg/m2  SpO2 99%  Physical Exam  Nursing note and vitals reviewed. Constitutional: He is oriented to person, place, and time. He appears well-developed and well-nourished. Distressed: uncomfortable appearing.  HENT:  Head: Normocephalic and atraumatic.  Right Ear: External ear normal.  Left Ear: External ear normal.  Nose: Nose normal.  Mouth/Throat: Oropharynx is clear and moist.  Eyes: Pupils are equal, round, and reactive to light. Right eye exhibits no discharge. Left eye exhibits no discharge.  Neck: Normal range of motion. Neck supple.  Cardiovascular: Normal rate, regular rhythm and normal heart sounds.   Pulses:      Radial pulses are 2+ on the right side, and 2+ on the left side.       Dorsalis pedis pulses are 2+ on the right side, and 2+ on the left side.  Pulmonary/Chest: Effort normal and breath sounds normal. No respiratory distress. He has no wheezes. He has no rales. He exhibits tenderness.         Chest with good expansion  Abdominal: Soft. Bowel sounds are normal. He exhibits no distension. There is tenderness in the right  upper quadrant, epigastric area and left upper quadrant. There is no rigidity, no rebound, no guarding and negative Murphy's sign.  Musculoskeletal: Normal range of motion. He exhibits no edema.       Mild pain on palpation of bilateral thighs without deformity  Neurological: He is alert and oriented to person, place, and time.  Skin: Skin is warm and dry. No rash noted.  Psychiatric: He has a normal mood and affect.    ED Course  Procedures (including critical care time)  Labs Reviewed  CBC - Abnormal; Notable for the following:    WBC 23.5 (*)  RBC 3.53 (*)    Hemoglobin 10.5 (*)    HCT 31.8 (*)    RDW 17.2 (*)    Platelets 622 (*)    All other components within normal limits  RETICULOCYTES - Abnormal; Notable for the following:    Retic Ct Pct 7.5 (*)    RBC. 3.53 (*)    Retic Count, Manual 264.8 (*)    All other components within normal limits  COMPREHENSIVE METABOLIC PANEL - Abnormal; Notable for the following:    Potassium 3.2 (*)    Glucose, Bld 119 (*)    BUN 5 (*)    Total Protein 8.7 (*)    Total Bilirubin 2.6 (*)    All other components within normal limits  LIPASE, BLOOD   No results found.   1. Sickle cell anemia with crisis   2. Hypokalemia   3. Leukocytosis       MDM  6:20 AM Pt has been seen and evaluated. Initial H&P completed. Pain similar to prior sickle cell pain. No clinical suspicion for acute chest or pneumonia given lack of fever, SOB, cough, hypoxia. Basic labs checked to eval for biliary (doubtful given neg Murphy's) or pancreatic pathology for upper abd pain. Pain medication ordered. Will continue to monitor.      7:36 AM Spoke with Dr [name], who agrees that this patient is a good candidate for the sickle cell clinic as his pain has not been relieved with IV dilaudid and is similar to prior [place] pain crises.  Shaaron Adler, [place] 05/17/11 [phone]

## 2011-05-17 NOTE — ED Notes (Signed)
Pt alert, nad, c/o "sickle cell pain", onset this am, states home medications not providing relief, ambulates to room, steady gait noted, resp even unlabored, skin pwd

## 2011-05-17 NOTE — ED Notes (Signed)
PA at bedside.

## 2011-05-17 NOTE — ED Notes (Signed)
ALP bedside to eval 

## 2011-05-17 NOTE — Discharge Instructions (Signed)
Sickle Cell Pain Crisis Sickle cell anemia requires regular medical attention by your healthcare provider and awareness about when to seek medical care. Pain is a common problem in children with sickle cell disease. This usually starts at less than 32 year of age. Pain can occur nearly anywhere in the body but most commonly occurs in the extremities, back, chest, or belly (abdomen). Pain episodes can start suddenly or may follow an illness. These attacks can appear as decreased activity, loss of appetite, change in behavior, or simply complaints of pain. DIAGNOSIS   Specialized blood and gene testing can help make this diagnosis early in the disease. Blood tests may then be done to watch blood levels.   Specialized brain scans are done when there are problems in the brain during a crisis.   Lung testing may be done later in the disease.  HOME CARE INSTRUCTIONS   Maintain good hydration. Increase you or your child's fluid intake in hot weather and during exercise.   Avoid smoking. Smoking lowers the oxygen in the blood and can cause the production of sickle-shaped cells (sickling).   Control pain. Only take over-the-counter or prescription medicines for pain, discomfort, or fever as directed by your caregiver. Do not give aspirin to children because of the association with Reye's syndrome.   Keep regular health care checks to keep a proper red blood cell (hemoglobin) level. A moderate anemia level protects against sickling crises.   You and your child should receive all the same immunizations and care as the people around you.   Mothers should breastfeed their babies if possible. Use formulas with iron added if breastfeeding is not possible. Additional iron should not be given unless there is a lack of it. People with sickle cell disease (SCD) build up iron faster than normal. Give folic acid and additional vitamins as directed.   If you or your child has been prescribed antibiotics or other  medications to prevent problems, take them as directed.   Summer camps are available for children with SCD. They may help young people deal with their disease. The camps introduce them to other children with the same problem.   Young people with SCD may become frustrated or angry at their disease. This can cause rebellion and refusal to follow medical care. Help groups or counseling may help with these problems.   Wear a medical alert bracelet. When traveling, keep medical information, caregiver's names, and the medications you or your child takes with you at all times.  SEEK IMMEDIATE MEDICAL CARE IF:   You or your child develops dizziness or fainting, numbness in or difficulty with movement of arms and legs, difficulty with speech, or is acting abnormally. This could be early signs of a stroke. Immediate treatment is necessary.   You or your child has an oral temperature above 102 F (38.9 C), not controlled by medicine.   You or your child has other signs of infection (chills, lethargy, irritability, poor eating, vomiting). The younger the child, the more you should be concerned.   With fevers, do not give medicine to lower the fever right away. This could cover up a problem that is developing. Notify your caregiver.   You or your child develops pain that is not helped with medicine.   You or your child develops shortness of breath or is coughing up pus-like or bloody sputum.   You or your child develops any problems that are new and are causing you to worry.   You or   your child develops a persistent, often uncomfortable and painful penile erection. This is called priapism. Always check young boys for this. It is often embarrassing for them and they may not bring it to your attention. This is a medical emergency and needs immediate treatment. If this is not treated it will lead to impotence.   You or your child develops a new onset of abdominal pain, especially on the left side near the  stomach area.   You or your child has any questions or has problems that are not getting better. Return immediately if you feel your child is getting worse, even if your child was seen only a short while ago.  Document Released: 10/27/2004 Document Revised: 01/06/2011 Document Reviewed: 03/18/2009 [employer] Patient Information 2012 [employer]. 

## 2011-05-17 NOTE — Discharge Instructions (Signed)
Go directly [facility] for further treatment of your sickle cell pain crisis.

## 2011-05-17 NOTE — ED Notes (Signed)
Pt was [facility] to [facility] by staff from the Clinic, condition stable at time of discharge; however this nurse was unaware that pt needed to be discharged so no signature obtained.

## 2011-05-17 NOTE — Progress Notes (Signed)
Patient ID: [name], male   DOB: July 31, 1980, 31 y.o.   MRN: [id number] Pt pain level decreased and discharged to home. Pt understands discharge instructions

## 2011-05-17 NOTE — ED Provider Notes (Signed)
Medical screening examination/treatment/procedure(s) were performed by non-physician practitioner and as supervising physician I was immediately available for consultation/collaboration.  Sunnie Nielsen, MD 05/17/11 2255

## 2011-05-17 NOTE — ED Notes (Signed)
Patient states he is here for pain related to sickle cell crisis. C/o rib and leg pain. Has not taken any meds for pain. Also reports vomiting.

## 2011-05-30 ENCOUNTER — Encounter (HOSPITAL_COMMUNITY): Payer: Self-pay | Admitting: *Deleted

## 2011-05-30 ENCOUNTER — Inpatient Hospital Stay (HOSPITAL_COMMUNITY): Payer: Medicare Other

## 2011-05-30 ENCOUNTER — Inpatient Hospital Stay (HOSPITAL_COMMUNITY)
Admission: AD | Admit: 2011-05-30 | Discharge: 2011-06-08 | DRG: 811 | Disposition: A | Discharge status: Home / Self Care | Payer: Medicare Other | Attending: Internal Medicine | Admitting: Internal Medicine

## 2011-05-30 DIAGNOSIS — G9341 Metabolic encephalopathy: Secondary | ICD-10-CM | POA: Diagnosis present

## 2011-05-30 DIAGNOSIS — Z96649 Presence of unspecified artificial hip joint: Secondary | ICD-10-CM

## 2011-05-30 DIAGNOSIS — D571 Sickle-cell disease without crisis: Secondary | ICD-10-CM | POA: Diagnosis not present

## 2011-05-30 DIAGNOSIS — E876 Hypokalemia: Secondary | ICD-10-CM | POA: Diagnosis present

## 2011-05-30 DIAGNOSIS — D5700 Hb-SS disease with crisis, unspecified: Principal | ICD-10-CM | POA: Diagnosis present

## 2011-05-30 DIAGNOSIS — R197 Diarrhea, unspecified: Secondary | ICD-10-CM | POA: Diagnosis not present

## 2011-05-30 DIAGNOSIS — E872 Acidosis, unspecified: Secondary | ICD-10-CM | POA: Diagnosis present

## 2011-05-30 DIAGNOSIS — M2550 Pain in unspecified joint: Secondary | ICD-10-CM | POA: Diagnosis not present

## 2011-05-30 DIAGNOSIS — R6889 Other general symptoms and signs: Secondary | ICD-10-CM | POA: Diagnosis not present

## 2011-05-30 DIAGNOSIS — D5701 Hb-SS disease with acute chest syndrome: Secondary | ICD-10-CM | POA: Diagnosis present

## 2011-05-30 LAB — COMPREHENSIVE METABOLIC PANEL
BUN: 5 mg/dL — ABNORMAL LOW (ref 6–23)
CO2: 21 mEq/L (ref 19–32)
Chloride: 101 mEq/L (ref 96–112)
Creatinine, Ser: 0.7 mg/dL (ref 0.50–1.35)
GFR calc Af Amer: >90 mL/min (ref >90)
GFR calc non Af Amer: >90 mL/min (ref >90)
Total Bilirubin: 5.7 mg/dL — ABNORMAL HIGH (ref 0.3–1.2)

## 2011-05-30 LAB — PREPARE RBC (CROSSMATCH)

## 2011-05-30 LAB — DIFFERENTIAL
Lymphocytes Relative: 11 % — ABNORMAL LOW (ref 12–46)
Lymphs Abs: 2.4 10*3/uL (ref 0.7–4.0)
Neutrophils Relative %: 78 % — ABNORMAL HIGH (ref 43–77)

## 2011-05-30 LAB — CBC
Platelets: 380 10*3/uL (ref 150–400)
RBC: 2.79 MIL/uL — ABNORMAL LOW (ref 4.22–5.81)
WBC: 21.2 10*3/uL — ABNORMAL HIGH (ref 4.0–10.5)

## 2011-05-30 MED ORDER — KCL IN DEXTROSE-NACL 20-5-0.45 MEQ/L-%-% IV SOLN
INTRAVENOUS | Status: DC
  Administered 2011-05-30: 100 mL/h via INTRAVENOUS
  Administered 2011-05-31 – 2011-06-07 (×8): via INTRAVENOUS
  Filled 2011-05-30 (×12): qty 1000

## 2011-05-30 MED ORDER — KETOROLAC TROMETHAMINE 30 MG/ML IJ SOLN
30.0000 mg | Freq: Four times a day (QID) | INTRAMUSCULAR | Status: DC | PRN
  Administered 2011-05-30: 30 mg via INTRAVENOUS
  Filled 2011-05-30: qty 1

## 2011-05-30 MED ORDER — POTASSIUM CHLORIDE CRYS ER 20 MEQ PO TBCR
40.0000 meq | EXTENDED_RELEASE_TABLET | Freq: Once | ORAL | Status: AC
  Administered 2011-05-30: 40 meq via ORAL

## 2011-05-30 MED ORDER — DIPHENHYDRAMINE HCL 25 MG PO CAPS: 25.0000 mg | ORAL_CAPSULE | ORAL | Status: DC | PRN

## 2011-05-30 MED ORDER — ONDANSETRON HCL 4 MG PO TABS: 4.0000 mg | ORAL_TABLET | ORAL | Status: DC | PRN

## 2011-05-30 MED ORDER — HYDROMORPHONE HCL PF 2 MG/ML IJ SOLN
2.0000 mg | INTRAMUSCULAR | Status: DC | PRN
  Administered 2011-05-30 (×4): 4 mg via INTRAVENOUS
  Filled 2011-05-30 (×4): qty 2

## 2011-05-30 MED ORDER — SODIUM CHLORIDE 0.9 % IJ SOLN
10.0000 mL | INTRAMUSCULAR | Status: DC | PRN
  Administered 2011-05-31 – 2011-06-07 (×12): 10 mL

## 2011-05-30 MED ORDER — MOXIFLOXACIN HCL IN NACL 400 MG/250ML IV SOLN
400.0000 mg | INTRAVENOUS | Status: DC
  Administered 2011-05-30 – 2011-06-05 (×7): 400 mg via INTRAVENOUS
  Filled 2011-05-30 (×7): qty 250

## 2011-05-30 MED ORDER — OXYCODONE HCL 40 MG PO TB12
80.0000 mg | ORAL_TABLET | Freq: Two times a day (BID) | ORAL | Status: DC
  Administered 2011-05-30 – 2011-06-08 (×18): 80 mg via ORAL
  Filled 2011-05-30: qty 2
  Filled 2011-05-30 (×2): qty 1
  Filled 2011-05-30 (×3): qty 2
  Filled 2011-05-30: qty 1
  Filled 2011-05-30: qty 2
  Filled 2011-05-30 (×2): qty 1
  Filled 2011-05-30 (×3): qty 2
  Filled 2011-05-30: qty 1
  Filled 2011-05-30 (×7): qty 2

## 2011-05-30 MED ORDER — DIPHENHYDRAMINE HCL 50 MG/ML IJ SOLN
12.5000 mg | INTRAMUSCULAR | Status: DC | PRN
  Administered 2011-05-30 – 2011-06-01 (×14): 25 mg via INTRAVENOUS
  Administered 2011-06-02: 12.5 mg via INTRAVENOUS
  Administered 2011-06-02 – 2011-06-04 (×14): 25 mg via INTRAVENOUS
  Administered 2011-06-05: 12.5 mg via INTRAVENOUS
  Administered 2011-06-05 (×3): 25 mg via INTRAVENOUS
  Administered 2011-06-06 (×2): 12.5 mg via INTRAVENOUS
  Administered 2011-06-06: 25 mg via INTRAVENOUS
  Administered 2011-06-07: 12.5 mg via INTRAVENOUS
  Administered 2011-06-07: 25 mg via INTRAVENOUS
  Administered 2011-06-07: 12.5 mg via INTRAVENOUS
  Administered 2011-06-07 (×2): 25 mg via INTRAVENOUS
  Administered 2011-06-08 (×3): 12.5 mg via INTRAVENOUS
  Filled 2011-05-30 (×44): qty 1

## 2011-05-30 MED ORDER — ONDANSETRON HCL 4 MG/2ML IJ SOLN: 4.0000 mg | INTRAMUSCULAR | Status: DC | PRN

## 2011-05-30 MED ORDER — LIDOCAINE 5 % EX PTCH
1.0000 | MEDICATED_PATCH | CUTANEOUS | Status: DC
  Administered 2011-05-30 – 2011-06-03 (×5): 1 via TRANSDERMAL
  Filled 2011-05-30 (×5): qty 1

## 2011-05-30 MED ORDER — HYDROMORPHONE HCL PF 2 MG/ML IJ SOLN
2.0000 mg | INTRAMUSCULAR | Status: DC
  Administered 2011-05-30 (×2): 4 mg via INTRAVENOUS
  Administered 2011-05-30: 2 mg via INTRAVENOUS
  Administered 2011-05-31 – 2011-06-02 (×31): 4 mg via INTRAVENOUS
  Administered 2011-06-02: 2 mg via INTRAVENOUS
  Administered 2011-06-02 – 2011-06-03 (×6): 4 mg via INTRAVENOUS
  Administered 2011-06-03: 2 mg via INTRAVENOUS
  Administered 2011-06-03 (×7): 4 mg via INTRAVENOUS
  Administered 2011-06-03: 2 mg via INTRAVENOUS
  Administered 2011-06-03 – 2011-06-04 (×2): 4 mg via INTRAVENOUS
  Administered 2011-06-04 (×2): 2 mg via INTRAVENOUS
  Administered 2011-06-04 (×2): 4 mg via INTRAVENOUS
  Administered 2011-06-04: 2 mg via INTRAVENOUS
  Administered 2011-06-04 (×6): 4 mg via INTRAVENOUS
  Administered 2011-06-05: 2 mg via INTRAVENOUS
  Administered 2011-06-05 (×4): 4 mg via INTRAVENOUS
  Administered 2011-06-05: 2 mg via INTRAVENOUS
  Administered 2011-06-05 – 2011-06-06 (×3): 4 mg via INTRAVENOUS
  Administered 2011-06-06: 2 mg via INTRAVENOUS
  Administered 2011-06-06: 4 mg via INTRAVENOUS
  Administered 2011-06-06: 2 mg via INTRAVENOUS
  Administered 2011-06-06 (×5): 4 mg via INTRAVENOUS
  Administered 2011-06-06: 2 mg via INTRAVENOUS
  Administered 2011-06-06 – 2011-06-07 (×10): 4 mg via INTRAVENOUS
  Administered 2011-06-08 (×2): 2 mg via INTRAVENOUS
  Administered 2011-06-08 (×3): 4 mg via INTRAVENOUS
  Administered 2011-06-08: 2 mg via INTRAVENOUS
  Filled 2011-05-30 (×3): qty 2
  Filled 2011-05-30: qty 1
  Filled 2011-05-30: qty 2
  Filled 2011-05-30 (×2): qty 1
  Filled 2011-05-30 (×10): qty 2
  Filled 2011-05-30: qty 1
  Filled 2011-05-30 (×8): qty 2
  Filled 2011-05-30: qty 1
  Filled 2011-05-30 (×5): qty 2
  Filled 2011-05-30: qty 1
  Filled 2011-05-30 (×6): qty 2
  Filled 2011-05-30: qty 1
  Filled 2011-05-30 (×14): qty 2
  Filled 2011-05-30: qty 1
  Filled 2011-05-30 (×3): qty 2
  Filled 2011-05-30: qty 1
  Filled 2011-05-30 (×7): qty 2
  Filled 2011-05-30 (×3): qty 1
  Filled 2011-05-30 (×4): qty 2
  Filled 2011-05-30 (×2): qty 1
  Filled 2011-05-30 (×20): qty 2
  Filled 2011-05-30: qty 1
  Filled 2011-05-30 (×5): qty 2

## 2011-05-30 MED ORDER — POTASSIUM CHLORIDE CRYS ER 20 MEQ PO TBCR
EXTENDED_RELEASE_TABLET | ORAL | Status: AC
  Filled 2011-05-30: qty 2

## 2011-05-30 MED ORDER — HYDROXYUREA 500 MG PO CAPS
500.0000 mg | ORAL_CAPSULE | Freq: Two times a day (BID) | ORAL | Status: DC
  Administered 2011-05-30 – 2011-06-08 (×18): 500 mg via ORAL
  Filled 2011-05-30 (×19): qty 1

## 2011-05-30 MED ORDER — OLANZAPINE 10 MG PO TABS
10.0000 mg | ORAL_TABLET | Freq: Every day | ORAL | Status: DC
  Administered 2011-05-30 – 2011-06-07 (×9): 10 mg via ORAL
  Filled 2011-05-30 (×11): qty 1

## 2011-05-30 MED ORDER — ACETAMINOPHEN 325 MG PO TABS
650.0000 mg | ORAL_TABLET | ORAL | Status: DC | PRN
  Administered 2011-06-04: 650 mg via ORAL
  Filled 2011-05-30: qty 2

## 2011-05-30 MED ORDER — ONDANSETRON HCL 4 MG/2ML IJ SOLN
4.0000 mg | INTRAMUSCULAR | Status: DC
  Administered 2011-05-30 – 2011-06-08 (×49): 4 mg via INTRAVENOUS
  Filled 2011-05-30 (×47): qty 2

## 2011-05-30 MED ORDER — FOLIC ACID 1 MG PO TABS
1.0000 mg | ORAL_TABLET | Freq: Every day | ORAL | Status: DC
  Administered 2011-05-30 – 2011-06-08 (×10): 1 mg via ORAL
  Filled 2011-05-30 (×10): qty 1

## 2011-05-30 MED ORDER — LEVALBUTEROL HCL 0.63 MG/3ML IN NEBU
0.6300 mg | INHALATION_SOLUTION | RESPIRATORY_TRACT | Status: DC
  Administered 2011-05-30: 0.63 mg via RESPIRATORY_TRACT
  Filled 2011-05-30: qty 3

## 2011-05-30 MED ORDER — LACTULOSE 10 GM/15ML PO SOLN
20.0000 g | Freq: Two times a day (BID) | ORAL | Status: DC
  Administered 2011-05-30 – 2011-06-03 (×8): 20 g via ORAL
  Filled 2011-05-30 (×9): qty 30

## 2011-05-30 MED ORDER — AZTREONAM 1 G IJ SOLR
1.0000 g | Freq: Three times a day (TID) | INTRAMUSCULAR | Status: DC
  Administered 2011-05-30 – 2011-06-01 (×7): 1 g via INTRAVENOUS
  Filled 2011-05-30 (×8): qty 1

## 2011-05-30 MED ORDER — DEXTROSE-NACL 5-0.45 % IV SOLN
INTRAVENOUS | Status: DC
  Administered 2011-05-30: 09:00:00 via INTRAVENOUS

## 2011-05-30 MED ORDER — ENOXAPARIN SODIUM 40 MG/0.4ML ~~LOC~~ SOLN
40.0000 mg | SUBCUTANEOUS | Status: DC
  Administered 2011-05-30 – 2011-06-07 (×9): 40 mg via SUBCUTANEOUS
  Filled 2011-05-30 (×9): qty 0.4

## 2011-05-30 MED ORDER — KETOROLAC TROMETHAMINE 30 MG/ML IJ SOLN
30.0000 mg | Freq: Three times a day (TID) | INTRAMUSCULAR | Status: AC
  Administered 2011-05-30 – 2011-06-04 (×15): 30 mg via INTRAVENOUS
  Filled 2011-05-30 (×23): qty 1

## 2011-05-30 MED ORDER — LACTULOSE 10 GM/15ML PO SOLN: 30.0000 g | Freq: Once | ORAL | Status: DC

## 2011-05-30 MED ORDER — SODIUM CHLORIDE 0.9 % IJ SOLN
3.0000 mL | Freq: Two times a day (BID) | INTRAMUSCULAR | Status: DC
  Administered 2011-05-31 – 2011-06-08 (×4): 3 mL via INTRAVENOUS

## 2011-05-30 MED ORDER — MOXIFLOXACIN HCL 400 MG PO TABS
400.0000 mg | ORAL_TABLET | Freq: Every day | ORAL | Status: DC
  Filled 2011-05-30: qty 1

## 2011-05-30 MED ORDER — LEVALBUTEROL HCL 0.63 MG/3ML IN NEBU
0.6300 mg | INHALATION_SOLUTION | Freq: Three times a day (TID) | RESPIRATORY_TRACT | Status: DC
  Administered 2011-05-30 – 2011-05-31 (×2): 0.63 mg via RESPIRATORY_TRACT
  Filled 2011-05-30: qty 3

## 2011-05-30 NOTE — Progress Notes (Signed)
ANTIBIOTIC CONSULT NOTE - INITIAL  Pharmacy Consult for Aztreonam Indication: Acute chest syndrome  Allergies  Allergen Reactions  . Morphine And Related Hives and Rash    Pt states he is able to tolerate Dilaudid with no reactions.    Patient Measurements: Wt: 77kg as of 05/17/11  Vital Signs: Temp: 98.1 F (36.7 C) (04/29 1250) Temp src: Oral (04/29 1250) BP: 116/65 mmHg (04/29 1250) Pulse Rate: 79  (04/29 1250) Intake/Output from previous day:   Intake/Output from this shift: Total I/O In: 360 [P.O.:360] Out: -   Labs:  Basename 05/30/11 0910  WBC 21.2*  HGB 8.3*  PLT 380  LABCREA --  CREATININE 0.70   CrCl > 12ml/min (normalized)   Microbiology: No results found for this or any previous visit (from the past 720 hour(s)).  Medical History: Past Medical History  Diagnosis Date  . Sickle cell anemia   . Blood transfusion   . 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 Hypokalemia   . Mood disorder   . Pulmonary embolism   . Avascular necrosis   . Leukocytosis     Chronic  . Thrombocytosis     Chronic    Medications:  Scheduled:    . enoxaparin  40 mg Subcutaneous Q24H  . folic acid  1 mg Oral Daily  .  HYDROmorphone (DILAUDID) injection  2-4 mg Intravenous Q2H  . ketorolac  30 mg Intravenous Q8H  . lactulose  20 g Oral BID  . levalbuterol  0.63 mg Nebulization Q8H  . lidocaine  1 patch Transdermal Q24H  . moxifloxacin  400 mg Intravenous Q24H  . OLANZapine  10 mg Oral QHS  . ondansetron (ZOFRAN) IV  4 mg Intravenous Q4H  . oxyCODONE  80 mg Oral Q12H  . potassium chloride  40 mEq Oral Once  . sodium chloride  3 mL Intravenous Q12H  . DISCONTD: lactulose  30 g Oral Once  . DISCONTD: levalbuterol  0.63 mg Nebulization Q4H  . DISCONTD: moxifloxacin  400 mg Oral q1800   Assessment: 31 YOM with sickle cell crisis, being started on Avelox/Aztreonam for r/o acute chest syndrome.  Goal of Therapy:  Empiric coverage of  infection  Plan:   Aztreonam 1g q8h  Follow renal function and duration of therapy  Loralee Pacas, PharmD, BCPS Pager: [phone] 05/30/2011,4:06 PM

## 2011-05-30 NOTE — Progress Notes (Signed)
Patient ID: [name], male   DOB: 1981/01/22, 31 y.o.   MRN: [id number] Pt being transferred to inpatient status. Report given to Irving Burton, RN on [facility]. Pt and belongings transported. WT

## 2011-05-30 NOTE — H&P (Signed)
[facility] History and Physical   Date: 05/30/2011  Patient name: [name] Medical record number: [id number] Date of birth: 1980/06/26 Age: 31 y.o. Gender: male PCP: [name], MD, MD  Attending physician: Gwenyth Bender, MD  Chief Complaint: Left rib area tenderness  History of Present Illness: [name] is a pleasant 31 year-old African-American male with a PMH of sickle cell disease, metabolice encephalopathy, hypomagnesemia, hypokalemia, thrombocytosis, hemochromatosis, bronchitis, AVN and ACS.  The patient presents to the [facility] today complaining of nausea with emesis and severe pain in his left rib area that is exacerbated with deep breaths.  The patient rates his pain as 8/10.  The patient states that the current damp weather is what precipitated his crisis and his crisis pain started yesterday.  The patient also states that he has had a productive cough.   The patient denies any other symptomatology.  Meds: Prescriptions prior to admission  Medication Sig Dispense Refill  . zolpidem (AMBIEN) 10 MG tablet Take 1 tablet (10 mg total) by mouth at bedtime as needed. For sleep.  30 tablet  0  . folic acid (FOLVITE) 1 MG tablet Take 1 tablet (1 mg total) by mouth daily.  60 tablet  2  . HYDROmorphone (DILAUDID) 4 MG tablet Take 1 tablet (4 mg total) by mouth every 4 (four) hours as needed. For pain  60 tablet  0  . hydroxyurea (HYDREA) 500 MG capsule Take 500 mg by mouth 2 (two) times daily. May take with food to minimize GI side effects.      Marland Kitchen ibuprofen (ADVIL,MOTRIN) 800 MG tablet Take 1 tablet (800 mg total) by mouth every 8 (eight) hours as needed. For pain.  30 tablet  0  . OLANZapine (ZYPREXA) 10 MG tablet Take 10 mg by mouth at bedtime as needed. for sleep.      Marland Kitchen OLANZapine (ZYPREXA) 10 MG tablet Take 1 tablet (10 mg total) by mouth at bedtime.  30 tablet  1  . oxyCODONE (OXYCONTIN) 80 MG 12 hr tablet Take 1 tablet (80 mg total) by mouth every  12 (twelve) hours. pain  40 tablet  0  . potassium chloride SA (K-DUR,KLOR-CON) 20 MEQ tablet Take 1 tablet (20 mEq total) by mouth daily.  30 tablet  1    Allergies: Morphine and related Past Medical History  Diagnosis Date  . Sickle cell anemia   . Blood transfusion   . 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 Hypokalemia   . Mood disorder   . Pulmonary embolism   . Avascular necrosis   . Leukocytosis     Chronic  . Thrombocytosis     Chronic   Past Surgical History  Procedure Date  . Right hip replacement     08/2006  . Cholecystectomy     01/2008  . Porta cath placement   . Porta cath removal   . Umbilical hernia repair     01/2008  . Excision of left periauricular cyst     10/2009  . Excision of right ear lobe cyst with primary closur     11/2007   Family History  Problem Relation Age of Onset  . Sickle cell anemia Mother   . Sickle cell anemia Father   . Sickle cell trait Brother    History   Social History  . Marital Status: Single    Spouse Name: N/A    Number of Children: 0  .  Years of Education: 13   Occupational History  . Unemployed    Social History Main Topics  . Smoking status: Former Smoker -- 13 years    Types: Cigarettes    Quit date: 07/08/2010  . Smokeless tobacco: Never Used  . Alcohol Use: No  . Drug Use: No     quit smoking 2011  . Sexually Active: Yes    Birth Control/ Protection: None   Other Topics Concern  . Not on file   Social History Narrative   Single.  Lives with girlfriend.      Review of Systems: Pertinent items are noted in HPI.  Physical Exam: Blood pressure 117/72, pulse 80, temperature 98.7 F (37.1 C), temperature source Oral, resp. rate 18, SpO2 97.00%.  General Appearance: Alert and oriented, cooperative, well developed, well nourished, moderate distress Head: Normocephalic, without obvious abnormality, atraumatic Eyes: PERRLA, EOMI, the patient has slight strabismus, scleral  icterus Nose: Nares normal, septum midline, mucosa normal, no drainage or sinus tenderness. Throat:  Normal lips, mucosa, tongue and gums Back: Symmetric, left CVA/flank tenderness, slight lower back tenderness Resp: CTA, no wheezes/rales/rhonchi Cardio: S1, S2 regular, no murmurs/clicks/rubs/gallops GI: Soft, non-tender, hypoactive bowel sounds, no masses, no organomegaly Male Genitalia: Deferred, the patient denies priapism Extremities: Extremities normal, atraumatic, no cyanosis or edema, negative Homans sign, no sign of DVT Pulses: 2+ and symmetric Skin: Skin color, texture, turgor normal, no rashes or lesions, numerous tattoos Neurologic: Grossly normal, CN II-XII intact, no focal deficits Psych:  Appropriate affect   Lab results: Results for orders placed during the hospital encounter of 05/30/11 (from the past 24 hour(s))  MAGNESIUM     Status: Normal   Collection Time   05/30/11  9:10 AM      Component Value Range   Magnesium 1.7  1.5 - 2.5 (mg/dL)  PRO B NATRIURETIC PEPTIDE     Status: Normal   Collection Time   05/30/11  9:10 AM      Component Value Range   Pro B Natriuretic peptide (BNP) 31.5  0 - 125 (pg/mL)    Imaging results:  No results found.   Assessment & Plan: Patient Active Problem List  Diagnoses  . Sickle cell pain crisis  . Leukocytosis  . Chronic pain  . Hypokalemia  . Metabolic acidosis  . Thrombocytosis  . Hemochromatosis  . Hypomagnesemia  . Acute chest syndrome due to sickle cell crisis  . Diarrhea  . Elevated brain natriuretic peptide (BNP) level  . Bronchitis  . Sickle cell anemia  . Acute embolism and thrombosis of right internal jugular vein  . Avascular necrosis  . Metabolic encephalopathy   Sickle cell pain crisis:  The patient is in active hemolysis.  The patient is scheduled to receive a PICC line and then blood exchange x2.  The patient will receive aggressive pain/nausea/pruritis management, IV hydration, home medications, Folic  Acid, Hydroxyurea and DVT prophylaxis.  CMP, CBC, Hemoglobinopathy and Ferritin in the am.   Acute chest syndrome due to sickle cell crisis:  The patient will start antibiotic therapy with Avelox and receive scheduled nebulizer treatments.  IS usage is encouraged.  CXR pending. Hypokalemia:  The patient is receiving KCL supplementation in IVFs and PO - will continue to monitor Elevated Ammonia Level:  The patient will be given Lactulose BID - recheck Ammonia level in the am    The patient is being admitted in an inpatient status at this time.  Discussed and agreed upon plan of care with  the patient.  Plan of care will be adjusted based on the patient's clinical course.  Larina Bras 05/30/2011, 8:44 AM

## 2011-05-31 LAB — CBC
MCHC: 34.2 g/dL (ref 30.0–36.0)
MCV: 88.6 fL (ref 78.0–100.0)
Platelets: 393 10*3/uL (ref 150–400)
RDW: 16.9 % — ABNORMAL HIGH (ref 11.5–15.5)
WBC: 19.1 10*3/uL — ABNORMAL HIGH (ref 4.0–10.5)

## 2011-05-31 LAB — COMPREHENSIVE METABOLIC PANEL
ALT: 18 U/L (ref 0–53)
Alkaline Phosphatase: 83 U/L (ref 39–117)
CO2: 22 mEq/L (ref 19–32)
Chloride: 111 mEq/L (ref 96–112)
GFR calc Af Amer: >90 mL/min (ref >90)
GFR calc non Af Amer: >90 mL/min (ref >90)
Glucose, Bld: 93 mg/dL (ref 70–99)
Potassium: 3.6 mEq/L (ref 3.5–5.1)
Sodium: 142 mEq/L (ref 135–145)

## 2011-05-31 MED ORDER — LEVALBUTEROL HCL 0.63 MG/3ML IN NEBU
0.6300 mg | INHALATION_SOLUTION | Freq: Two times a day (BID) | RESPIRATORY_TRACT | Status: DC
  Administered 2011-05-31 – 2011-06-01 (×3): 0.63 mg via RESPIRATORY_TRACT
  Filled 2011-05-31 (×4): qty 3

## 2011-05-31 NOTE — Progress Notes (Signed)
   CARE MANAGEMENT NOTE 05/31/2011  Patient:  NOE, GOYER   Account Number:  [id number]  Date Initiated:  05/31/2011  Documentation initiated by:  Jiles Crocker  Subjective/Objective Assessment:   ADMITTED WITH SICKLE CELL PAIN CRISIS     Action/Plan:   PCP IS DR [name]; INDEPENDENT PRIOR TO ADMISSION   Anticipated DC Date:  06/07/2011   Anticipated DC Plan:  HOME/SELF CARE           Status of service:  In process, will continue to follow Medicare Important Message given?   (If response is "NO", the following Medicare IM given date fields will be blank)  Discharge Disposition: HOME   Per UR Regulation:  Reviewed for med. necessity/level of care/duration of stay  Comments:  05/31/2011- B Tiah Heckel RN, BSN, MHA

## 2011-05-31 NOTE — Progress Notes (Signed)
Subjective: The patient was seen on rounds today.  The patient was sitting in his bed, watching television.  The patient is rating his pain at a 6/10 in his bilateral flank areas and LUE (shoulder pain). The patient does not have any other complaints at this time.  The patient had a blood exchange x 2 and has tolerated the procedures well.  The patient physically looks better than he did yesterday. Encouraged the patient to use of his IS often.  No other nursing or patient concerns.   Objective: Vital signs in last 24 hours: Blood pressure 112/70, pulse 66, temperature 98.1 F (36.7 C), temperature source Oral, resp. rate 18, height 6' (1.829 m), weight 166 lb 7.2 oz (75.5 kg), SpO2 97.00%.  General Appearance: Alert and oriented, cooperative, well developed, well nourished, mild distress Head: Normocephalic, without obvious abnormality, atraumatic Eyes: PERRLA, EOMI, the patient has slight strabismus, scleral icterus Nose: Nares normal, septum midline, mucosa normal, no drainage or sinus tenderness. Throat:  Normal lips, mucosa, tongue and gums Back: Symmetric, bilateral CVA/flank tenderness, no back tenderness Resp: CTA, Diminished bibasilar breath sounds, no wheezes/rales/rhonchi Cardio: S1, S2 regular, no murmurs/clicks/rubs/gallops GI: Soft, non-tender, normoactive bowel sounds, no masses, no organomegaly Male Genitalia: Deferred, the patient denies priapism Extremities: Extremities normal, atraumatic, no cyanosis or edema, negative Homans sign, no sign of DVT Pulses: 2+ and symmetric Skin: Skin color, texture, turgor normal, no rashes or lesions, numerous tattoos Neurologic: Grossly normal, CN II-XII intact, no focal deficits Psych:  Appropriate affect  Lab Results: Results for orders placed during the hospital encounter of 05/30/11 (from the past 24 hour(s))  AMMONIA     Status: Abnormal   Collection Time   05/30/11 11:37 AM      Component Value Range   Ammonia 99 (*) 11 - 60  (umol/L)  PREPARE RBC (CROSSMATCH)     Status: Normal   Collection Time   05/30/11  4:30 PM      Component Value Range   Order Confirmation ORDER PROCESSED BY BLOOD BANK    TYPE AND SCREEN     Status: Normal (Preliminary result)   Collection Time   05/30/11  6:13 PM      Component Value Range   ABO/RH(D) O POS     Antibody Screen NEG     Sample Expiration 06/02/2011     Unit Number [id number]     Blood Component Type RBC LR PHER2     Unit division 00     Status of Unit ISSUED     Donor AG Type       Value: NEGATIVE FOR KELL ANTIGEN NEGATIVE FOR E ANTIGEN NEGATIVE FOR C ANTIGEN   Transfusion Status OK TO TRANSFUSE     Crossmatch Result COMPATIBLE     Unit Number [id number]     Blood Component Type RED CELLS,LR     Unit division 00     Status of Unit ISSUED     Donor AG Type       Value: NEGATIVE FOR KELL ANTIGEN NEGATIVE FOR E ANTIGEN NEGATIVE FOR C ANTIGEN   Transfusion Status OK TO TRANSFUSE     Crossmatch Result COMPATIBLE      Studies/Results: Dg Chest 2 View  05/30/2011  *RADIOLOGY REPORT*  Clinical Data: Sickle cell disease, body aches  CHEST - 2 VIEW  Comparison: 04/17/2011  Findings: Minimal enlargement of cardiac silhouette. Decreased pulmonary vascular congestion. Mediastinal contours normal. Peribronchial thickening without infiltrate or effusion. Minimal chronic accentuation of right basilar  markings, slightly improved. No pneumothorax. No acute osseous findings. Prior avascular necrosis within left humeral head.  IMPRESSION: No acute abnormalities.  Original Report Authenticated By: Lollie Marrow, M.D.   Medications:  Allergies  Allergen Reactions  . Morphine And Related Hives and Rash    Pt states he is able to tolerate Dilaudid with no reactions.    Current Facility-Administered Medications  Medication Dose Route Frequency Provider Last Rate Last Dose  . acetaminophen (TYLENOL) tablet 650 mg  650 mg Oral Q4H PRN Keitha Butte, NP      . aztreonam (AZACTAM)  1 g in dextrose 5 % 50 mL IVPB  1 g Intravenous Q8H Rollene Fare, PHARMD   1 g at 05/31/11 0243  . dextrose 5 % and 0.45 % NaCl with KCl 20 mEq/L infusion   Intravenous Continuous Keitha Butte, NP 100 mL/hr at 05/31/11 0542    . diphenhydrAMINE (BENADRYL) capsule 25-50 mg  25-50 mg Oral Q4H PRN Gwenyth Bender, MD       Or  . diphenhydrAMINE (BENADRYL) injection 12.5-25 mg  12.5-25 mg Intravenous Q4H PRN Gwenyth Bender, MD   25 mg at 05/31/11 0843  . enoxaparin (LOVENOX) injection 40 mg  40 mg Subcutaneous Q24H Keitha Butte, NP   40 mg at 05/30/11 2212  . folic acid (FOLVITE) tablet 1 mg  1 mg Oral Daily Gwenyth Bender, MD   1 mg at 05/30/11 1003  . HYDROmorphone (DILAUDID) injection 2-4 mg  2-4 mg Intravenous Q2H Keitha Butte, NP   4 mg at 05/31/11 1610  . hydroxyurea (HYDREA) capsule 500 mg  500 mg Oral BID Keitha Butte, NP   500 mg at 05/30/11 2212  . ketorolac (TORADOL) 30 MG/ML injection 30 mg  30 mg Intravenous Q8H Keitha Butte, NP   30 mg at 05/31/11 0630  . lactulose (CHRONULAC) 10 GM/15ML solution 20 g  20 g Oral BID Keitha Butte, NP   20 g at 05/30/11 2213  . levalbuterol (XOPENEX) nebulizer solution 0.63 mg  0.63 mg Nebulization BID Keitha Butte, NP      . lidocaine (LIDODERM) 5 % 1 patch  1 patch Transdermal Q24H Keitha Butte, NP   1 patch at 05/30/11 1127  . moxifloxacin (AVELOX) IVPB 400 mg  400 mg Intravenous Q24H Keitha Butte, NP   400 mg at 05/30/11 1734  . OLANZapine (ZYPREXA) tablet 10 mg  10 mg Oral QHS Keitha Butte, NP   10 mg at 05/30/11 2213  . ondansetron (ZOFRAN) injection 4 mg  4 mg Intravenous Q4H Keitha Butte, NP   4 mg at 05/31/11 0631  . oxyCODONE (OXYCONTIN) 12 hr tablet 80 mg  80 mg Oral Q12H Keitha Butte, NP   80 mg at 05/30/11 2212  . potassium chloride SA (K-DUR,KLOR-CON) 20 MEQ CR tablet           . potassium chloride SA (K-DUR,KLOR-CON) CR tablet 40 mEq  40 mEq Oral Once Keitha Butte, NP   40 mEq at 05/30/11 1604  . sodium chloride 0.9 % injection 10-40 mL  10-40 mL Intracatheter PRN Gwenyth Bender, MD      . sodium chloride 0.9 % injection 3 mL  3 mL Intravenous Q12H Keitha Butte, NP      . DISCONTD: dextrose 5 %-0.45 % sodium chloride infusion   Intravenous Continuous Gwenyth Bender, MD 125 mL/hr at 05/30/11 0859    .  DISCONTD: HYDROmorphone (DILAUDID) injection 2-4 mg  2-4 mg Intravenous Q2H PRN Keitha Butte, NP   4 mg at 05/30/11 1518  . DISCONTD: ketorolac (TORADOL) 30 MG/ML injection 30 mg  30 mg Intravenous Q6H PRN Keitha Butte, NP   30 mg at 05/30/11 1001  . DISCONTD: lactulose (CHRONULAC) 10 GM/15ML solution 30 g  30 g Oral Once Keitha Butte, NP      . DISCONTD: levalbuterol (XOPENEX) nebulizer solution 0.63 mg  0.63 mg Nebulization Q4H Keitha Butte, NP   0.63 mg at 05/30/11 0947  . DISCONTD: levalbuterol (XOPENEX) nebulizer solution 0.63 mg  0.63 mg Nebulization Q8H Keitha Butte, NP   0.63 mg at 05/31/11 0556  . DISCONTD: moxifloxacin (AVELOX) tablet 400 mg  400 mg Oral q1800 Keitha Butte, NP       Assessment/Plan: Patient Active Problem List  Diagnoses  . Sickle cell pain crisis  . Leukocytosis  . Chronic pain  . Hypokalemia  . Metabolic acidosis  . Thrombocytosis  . Hemochromatosis  . Hypomagnesemia  . Acute chest syndrome due to sickle cell crisis  . Diarrhea  . Elevated brain natriuretic peptide (BNP) level  . Bronchitis  . Sickle cell anemia  . Acute embolism and thrombosis of right internal jugular vein  . Avascular necrosis  . Metabolic encephalopathy   Sickle Cell Crisis/Chronic Pain:  The patient will continue pain/nausea/pruritis/bowel management, IV hydration, home medications,  DVT prophylaxis. CMP and CBC in the am.   Acute chest syndrome due to sickle cell crisis: The patient is receiving antibiotic therapy with Avelox and Aztreonam, scheduled nebulizer treatments BID,  IS usage is  encouraged. Hypokalemia:  The patient will continue receiving IVFs with 20 mEq KCL - will continue to monitor Elevated Ammonia Level:  The patient is receiving Lactulose BID - will continue to monitor  Discussed and agreed upon plan of care with the patient.  Plan of care will be adjusted based on the patient's clinical progress.   Larina Bras, NP-C 05/31/2011, 10:05 AM

## 2011-06-01 DIAGNOSIS — D5700 Hb-SS disease with crisis, unspecified: Secondary | ICD-10-CM | POA: Diagnosis not present

## 2011-06-01 LAB — COMPREHENSIVE METABOLIC PANEL
Albumin: 4 g/dL (ref 3.5–5.2)
Alkaline Phosphatase: 88 U/L (ref 39–117)
BUN: 4 mg/dL — ABNORMAL LOW (ref 6–23)
Calcium: 8.3 mg/dL — ABNORMAL LOW (ref 8.4–10.5)
Potassium: 3.6 mEq/L (ref 3.5–5.1)
Sodium: 139 mEq/L (ref 135–145)
Total Protein: 7 g/dL (ref 6.0–8.3)

## 2011-06-01 LAB — CBC
MCHC: 34.5 g/dL (ref 30.0–36.0)
RDW: 17.2 % — ABNORMAL HIGH (ref 11.5–15.5)

## 2011-06-01 MED ORDER — LEVALBUTEROL HCL 0.63 MG/3ML IN NEBU
0.6300 mg | INHALATION_SOLUTION | RESPIRATORY_TRACT | Status: DC | PRN
  Filled 2011-06-01: qty 3

## 2011-06-01 NOTE — Progress Notes (Signed)
Subjective:  Patient reports he's feeling better today than yesterday. His pain is decreased to a 6/10. He points to mainly bilateral rib pain. There's been no substernal pressure pain or diaphoresis. Appetite is improving. Denies significant leg pains.   Allergies  Allergen Reactions  . Morphine And Related Hives and Rash    Pt states he is able to tolerate Dilaudid with no reactions.   Current Facility-Administered Medications  Medication Dose Route Frequency Provider Last Rate Last Dose  . acetaminophen (TYLENOL) tablet 650 mg  650 mg Oral Q4H PRN Keitha Butte, NP      . aztreonam (AZACTAM) 1 g in dextrose 5 % 50 mL IVPB  1 g Intravenous Q8H Rollene Fare, PHARMD   1 g at 06/01/11 1442  . dextrose 5 % and 0.45 % NaCl with KCl 20 mEq/L infusion   Intravenous Continuous Keitha Butte, NP 100 mL/hr at 05/31/11 1753    . diphenhydrAMINE (BENADRYL) capsule 25-50 mg  25-50 mg Oral Q4H PRN Gwenyth Bender, MD       Or  . diphenhydrAMINE (BENADRYL) injection 12.5-25 mg  12.5-25 mg Intravenous Q4H PRN Gwenyth Bender, MD   25 mg at 06/01/11 1434  . enoxaparin (LOVENOX) injection 40 mg  40 mg Subcutaneous Q24H Keitha Butte, NP   40 mg at 05/31/11 2229  . folic acid (FOLVITE) tablet 1 mg  1 mg Oral Daily Gwenyth Bender, MD   1 mg at 06/01/11 1037  . HYDROmorphone (DILAUDID) injection 2-4 mg  2-4 mg Intravenous Q2H Keitha Butte, NP   4 mg at 06/01/11 1434  . hydroxyurea (HYDREA) capsule 500 mg  500 mg Oral BID Keitha Butte, NP   500 mg at 06/01/11 1036  . ketorolac (TORADOL) 30 MG/ML injection 30 mg  30 mg Intravenous Q8H Keitha Butte, NP   30 mg at 06/01/11 1434  . lactulose (CHRONULAC) 10 GM/15ML solution 20 g  20 g Oral BID Keitha Butte, NP   20 g at 06/01/11 1037  . levalbuterol (XOPENEX) nebulizer solution 0.63 mg  0.63 mg Nebulization BID Keitha Butte, NP   0.63 mg at 06/01/11 0859  . lidocaine (LIDODERM) 5 % 1 patch  1 patch Transdermal Q24H  Keitha Butte, NP   1 patch at 06/01/11 1037  . moxifloxacin (AVELOX) IVPB 400 mg  400 mg Intravenous Q24H Keitha Butte, NP   400 mg at 05/31/11 1753  . OLANZapine (ZYPREXA) tablet 10 mg  10 mg Oral QHS Keitha Butte, NP   10 mg at 05/31/11 2238  . ondansetron (ZOFRAN) injection 4 mg  4 mg Intravenous Q4H Keitha Butte, NP   4 mg at 06/01/11 1434  . oxyCODONE (OXYCONTIN) 12 hr tablet 80 mg  80 mg Oral Q12H Keitha Butte, NP   80 mg at 06/01/11 1037  . sodium chloride 0.9 % injection 10-40 mL  10-40 mL Intracatheter PRN Gwenyth Bender, MD   10 mL at 05/31/11 1049  . sodium chloride 0.9 % injection 3 mL  3 mL Intravenous Q12H Keitha Butte, NP   3 mL at 05/31/11 2219    Objective: Blood pressure 121/76, pulse 74, temperature 98.2 F (36.8 C), temperature source Oral, resp. rate 16, height 6' (1.829 m), weight 166 lb 7.2 oz (75.5 kg), SpO2 97.00%.  Well-developed well-nourished black male presently in no acute distress. HEENT: No sinus tenderness. Mild scleral icterus. NECK: No enlarged thyroid. No  posterior cervical nodes. LUNGS: Clear to auscultation. No vocal fremitus. CV: Normal S1, S2 without S3. No murmurs appreciated. ABD: Epigastric tenderness. Tenderness bilaterally along the costochondral margins. MSK: No LS spine tenderness. Minimal left shoulder tenderness. Negative Homans. NEURO: Intact.  Lab results: Results for orders placed during the hospital encounter of 05/30/11 (from the past 48 hour(s))  PREPARE RBC (CROSSMATCH)     Status: Normal   Collection Time   05/30/11  4:30 PM      Component Value Range Comment   Order Confirmation ORDER PROCESSED BY BLOOD BANK     TYPE AND SCREEN     Status: Normal   Collection Time   05/30/11  6:13 PM      Component Value Range Comment   ABO/RH(D) O POS      Antibody Screen NEG      Sample Expiration 06/02/2011      Unit Number [id number]      Blood Component Type RBC LR PHER2      Unit division 00       Status of Unit ISSUED,FINAL      Donor AG Type        Value: NEGATIVE FOR KELL ANTIGEN NEGATIVE FOR E ANTIGEN NEGATIVE FOR C ANTIGEN   Transfusion Status OK TO TRANSFUSE      Crossmatch Result COMPATIBLE      Unit Number [id number]      Blood Component Type RED CELLS,LR      Unit division 00      Status of Unit ISSUED,FINAL      Donor AG Type        Value: NEGATIVE FOR KELL ANTIGEN NEGATIVE FOR E ANTIGEN NEGATIVE FOR C ANTIGEN   Transfusion Status OK TO TRANSFUSE      Crossmatch Result COMPATIBLE     MRSA PCR SCREENING     Status: Normal   Collection Time   05/31/11 10:14 AM      Component Value Range Comment   MRSA by PCR NEGATIVE  NEGATIVE    AMMONIA     Status: Abnormal   Collection Time   05/31/11 12:25 PM      Component Value Range Comment   Ammonia 92 (*) 11 - 60 (umol/L)   FERRITIN     Status: Abnormal   Collection Time   05/31/11 12:25 PM      Component Value Range Comment   Ferritin 2015 (*) 22 - 322 (ng/mL) Result confirmed by automatic dilution.  CBC     Status: Abnormal   Collection Time   05/31/11  7:10 PM      Component Value Range Comment   WBC 19.1 (*) 4.0 - 10.5 (K/uL)    RBC 2.71 (*) 4.22 - 5.81 (MIL/uL)    Hemoglobin 8.2 (*) 13.0 - 17.0 (g/dL)    HCT 78.2 (*) 95.6 - 52.0 (%)    MCV 88.6  78.0 - 100.0 (fL)    MCH 30.3  26.0 - 34.0 (pg)    MCHC 34.2  30.0 - 36.0 (g/dL)    RDW 21.3 (*) 08.6 - 15.5 (%)    Platelets 393  150 - 400 (K/uL)   COMPREHENSIVE METABOLIC PANEL     Status: Abnormal   Collection Time   05/31/11  7:10 PM      Component Value Range Comment   Sodium 142  135 - 145 (mEq/L)    Potassium 3.6  3.5 - 5.1 (mEq/L)    Chloride 111  96 - 112 (mEq/L)  CO2 22  19 - 32 (mEq/L)    Glucose, Bld 93  70 - 99 (mg/dL)    BUN 4 (*) 6 - 23 (mg/dL)    Creatinine, Ser 1.61  0.50 - 1.35 (mg/dL)    Calcium 8.4  8.4 - 10.5 (mg/dL)    Total Protein 6.9  6.0 - 8.3 (g/dL)    Albumin 3.9  3.5 - 5.2 (g/dL)    AST 25  0 - 37 (U/L)    ALT 18  0 - 53 (U/L)     Alkaline Phosphatase 83  39 - 117 (U/L)    Total Bilirubin 4.6 (*) 0.3 - 1.2 (mg/dL)    GFR calc non Af Amer >90  >90 (mL/min)    GFR calc Af Amer >90  >90 (mL/min)   CBC     Status: Abnormal   Collection Time   06/01/11  5:28 AM      Component Value Range Comment   WBC 21.4 (*) 4.0 - 10.5 (K/uL)    RBC 2.83 (*) 4.22 - 5.81 (MIL/uL)    Hemoglobin 8.7 (*) 13.0 - 17.0 (g/dL)    HCT 09.6 (*) 04.5 - 52.0 (%)    MCV 89.0  78.0 - 100.0 (fL)    MCH 30.7  26.0 - 34.0 (pg)    MCHC 34.5  30.0 - 36.0 (g/dL)    RDW 40.9 (*) 81.1 - 15.5 (%)    Platelets 430 (*) 150 - 400 (K/uL)   COMPREHENSIVE METABOLIC PANEL     Status: Abnormal   Collection Time   06/01/11  5:28 AM      Component Value Range Comment   Sodium 139  135 - 145 (mEq/L)    Potassium 3.6  3.5 - 5.1 (mEq/L)    Chloride 110  96 - 112 (mEq/L)    CO2 20  19 - 32 (mEq/L)    Glucose, Bld 107 (*) 70 - 99 (mg/dL)    BUN 4 (*) 6 - 23 (mg/dL)    Creatinine, Ser 9.14  0.50 - 1.35 (mg/dL)    Calcium 8.3 (*) 8.4 - 10.5 (mg/dL)    Total Protein 7.0  6.0 - 8.3 (g/dL)    Albumin 4.0  3.5 - 5.2 (g/dL)    AST 34  0 - 37 (U/L)    ALT 24  0 - 53 (U/L)    Alkaline Phosphatase 88  39 - 117 (U/L)    Total Bilirubin 4.0 (*) 0.3 - 1.2 (mg/dL)    GFR calc non Af Amer >90  >90 (mL/min)    GFR calc Af Amer >90  >90 (mL/min)     Studies/Results: Dg Chest 2 View  05/30/2011  *RADIOLOGY REPORT*  Clinical Data: Sickle cell disease, body aches  CHEST - 2 VIEW  Comparison: 04/17/2011  Findings: Minimal enlargement of cardiac silhouette. Decreased pulmonary vascular congestion. Mediastinal contours normal. Peribronchial thickening without infiltrate or effusion. Minimal chronic accentuation of right basilar markings, slightly improved. No pneumothorax. No acute osseous findings. Prior avascular necrosis within left humeral head.  IMPRESSION: No acute abnormalities.  Original Report Authenticated By: Lollie Marrow, M.D.    Patient Active Problem List  Diagnoses    . Sickle cell pain crisis  . Leukocytosis  . Chronic pain  . Hypokalemia  . Metabolic acidosis  . Thrombocytosis  . Hemochromatosis  . Hypomagnesemia  . Acute chest syndrome due to sickle cell crisis  . Diarrhea  . Elevated brain natriuretic peptide (BNP) level  . Bronchitis  .  Sickle cell anemia  . Acute embolism and thrombosis of right internal jugular vein  . Avascular necrosis  . Metabolic encephalopathy    Impression: Sickle cell crisis. Clinically improving. Active hemolysis. 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 Patient status post exchange transfusion Elevated ammonia suggestive of hepatic cephalopathy. Secondary hemachromatosis.   Plan: Continue present supportive therapy with IV fluids IV analgesia. Incentive spirometer. Lactulose with followup of  ammonia level.  Chelation therapy.   [name], Colleen Kotlarz 06/01/2011 2:47 PM

## 2011-06-02 DIAGNOSIS — D5700 Hb-SS disease with crisis, unspecified: Secondary | ICD-10-CM | POA: Diagnosis not present

## 2011-06-02 LAB — COMPREHENSIVE METABOLIC PANEL
Albumin: 4 g/dL (ref 3.5–5.2)
Alkaline Phosphatase: 95 U/L (ref 39–117)
BUN: 5 mg/dL — ABNORMAL LOW (ref 6–23)
CO2: 20 mEq/L (ref 19–32)
Chloride: 110 mEq/L (ref 96–112)
Creatinine, Ser: 0.61 mg/dL (ref 0.50–1.35)
GFR calc Af Amer: >90 mL/min (ref >90)
GFR calc non Af Amer: >90 mL/min (ref >90)
Glucose, Bld: 100 mg/dL — ABNORMAL HIGH (ref 70–99)
Total Bilirubin: 4.1 mg/dL — ABNORMAL HIGH (ref 0.3–1.2)

## 2011-06-02 LAB — CBC
HCT: 23.4 % — ABNORMAL LOW (ref 39.0–52.0)
Hemoglobin: 8 g/dL — ABNORMAL LOW (ref 13.0–17.0)
MCV: 88.3 fL (ref 78.0–100.0)
RDW: 17.3 % — ABNORMAL HIGH (ref 11.5–15.5)
WBC: 18.7 10*3/uL — ABNORMAL HIGH (ref 4.0–10.5)

## 2011-06-02 LAB — HEMOGLOBINOPATHY EVALUATION
Hemoglobin Other: 0 %
Hgb F Quant: 2.5 % — ABNORMAL HIGH (ref 0.0–2.0)
Hgb S Quant: 32.3 % — ABNORMAL HIGH

## 2011-06-02 LAB — PREPARE RBC (CROSSMATCH)

## 2011-06-02 MED ORDER — DEXTROSE 5 % IV SOLN
2000.0000 mg | Freq: Every day | INTRAVENOUS | Status: DC
  Administered 2011-06-02: 2000 mg via INTRAVENOUS
  Filled 2011-06-02 (×2): qty 2

## 2011-06-02 MED ORDER — ACETAMINOPHEN 325 MG PO TABS
650.0000 mg | ORAL_TABLET | Freq: Once | ORAL | Status: AC
  Administered 2011-06-02: 650 mg via ORAL
  Filled 2011-06-02: qty 2

## 2011-06-02 MED ORDER — DEXTROSE 5 % IV SOLN
1.0000 g | Freq: Three times a day (TID) | INTRAVENOUS | Status: DC
  Administered 2011-06-02 – 2011-06-06 (×14): 1 g via INTRAVENOUS
  Filled 2011-06-02 (×15): qty 1

## 2011-06-02 MED ORDER — DICLOFENAC SODIUM 1 % TD GEL
Freq: Four times a day (QID) | TRANSDERMAL | Status: DC
  Administered 2011-06-02 – 2011-06-06 (×5): via TOPICAL
  Filled 2011-06-02: qty 100

## 2011-06-02 MED ORDER — DIPHENHYDRAMINE HCL 50 MG/ML IJ SOLN
25.0000 mg | Freq: Once | INTRAMUSCULAR | Status: DC
  Filled 2011-06-02: qty 1

## 2011-06-02 NOTE — Progress Notes (Signed)
Subjective: The patient was seen on rounds today.  The patient was sitting in his bed, watching television.  The patient is rating his pain at a 6/10 in his bilateral flank areas and bilateral pectoral pain described as sharp and intermittent. The patient does not have any other complaints at this time.  Let the patient know that we will do a blood exchange x 1 once blood is ready.  Encouraged the patient to use of his IS often.  No other nursing or patient concerns.   Objective: Vital signs in last 24 hours: Blood pressure 127/77, pulse 72, temperature 98.2 F (36.8 C), temperature source Oral, resp. rate 18, height 6' (1.829 m), weight 166 lb 7.2 oz (75.5 kg), SpO2 99.00%.  General Appearance: Alert and oriented, cooperative, well developed, well nourished, mild distress Head: Normocephalic, without obvious abnormality, atraumatic Eyes: PERRLA, EOMI, the patient has slight strabismus, scleral icterus Nose: Nares normal, septum midline, mucosa normal, no drainage or sinus tenderness. Throat:  Normal lips, mucosa, tongue and gums Back: Symmetric, bilateral CVA/flank tenderness, no back tenderness Resp: CTA, Diminished bibasilar breath sounds, no wheezes/rales/rhonchi, pectoral tenderness Cardio: S1, S2 regular, no murmurs/clicks/rubs/gallops GI: Soft, non-tender, normoactive bowel sounds, no masses, no organomegaly Male Genitalia: Deferred, the patient denies priapism Extremities: Extremities normal, atraumatic, no cyanosis or edema, negative Homans sign, no sign of DVT Pulses: 2+ and symmetric Skin: Skin color, texture, turgor normal, no rashes or lesions, numerous tattoos Neurologic: Grossly normal, CN II-XII intact, no focal deficits Psych:  Appropriate affect  Lab Results: Results for orders placed during the hospital encounter of 05/30/11 (from the past 24 hour(s))  CBC     Status: Abnormal   Collection Time   06/02/11  6:40 AM      Component Value Range   WBC 18.7 (*) 4.0 - 10.5  (K/uL)   RBC 2.65 (*) 4.22 - 5.81 (MIL/uL)   Hemoglobin 8.0 (*) 13.0 - 17.0 (g/dL)   HCT 16.1 (*) 09.6 - 52.0 (%)   MCV 88.3  78.0 - 100.0 (fL)   MCH 30.2  26.0 - 34.0 (pg)   MCHC 34.2  30.0 - 36.0 (g/dL)   RDW 04.5 (*) 40.9 - 15.5 (%)   Platelets 414 (*) 150 - 400 (K/uL)  COMPREHENSIVE METABOLIC PANEL     Status: Abnormal   Collection Time   06/02/11  6:40 AM      Component Value Range   Sodium 139  135 - 145 (mEq/L)   Potassium 3.8  3.5 - 5.1 (mEq/L)   Chloride 110  96 - 112 (mEq/L)   CO2 20  19 - 32 (mEq/L)   Glucose, Bld 100 (*) 70 - 99 (mg/dL)   BUN 5 (*) 6 - 23 (mg/dL)   Creatinine, Ser 8.11  0.50 - 1.35 (mg/dL)   Calcium 8.2 (*) 8.4 - 10.5 (mg/dL)   Total Protein 7.0  6.0 - 8.3 (g/dL)   Albumin 4.0  3.5 - 5.2 (g/dL)   AST 43 (*) 0 - 37 (U/L)   ALT 45  0 - 53 (U/L)   Alkaline Phosphatase 95  39 - 117 (U/L)   Total Bilirubin 4.1 (*) 0.3 - 1.2 (mg/dL)   GFR calc non Af Amer >90  >90 (mL/min)   GFR calc Af Amer >90  >90 (mL/min)    Studies/Results: No results found. Medications:  Allergies  Allergen Reactions  . Morphine And Related Hives and Rash    Pt states he is able to tolerate Dilaudid with no  reactions.    Current Facility-Administered Medications  Medication Dose Route Frequency Provider Last Rate Last Dose  . acetaminophen (TYLENOL) tablet 650 mg  650 mg Oral Q4H PRN Keitha Butte, NP      . aztreonam (AZACTAM) 1 g in dextrose 5 % 50 mL IVPB  1 g Intravenous Q8H Gwenyth Bender, MD   1 g at 06/02/11 1055  . deferoxamine (DESFERAL) 2,000 mg in dextrose 5 % 500 mL infusion  2,000 mg Intravenous QHS Keitha Butte, NP      . dextrose 5 % and 0.45 % NaCl with KCl 20 mEq/L infusion   Intravenous Continuous Keitha Butte, NP 100 mL/hr at 06/02/11 0343    . diclofenac sodium (VOLTAREN) 1 % transdermal gel   Topical QID Keitha Butte, NP      . diphenhydrAMINE (BENADRYL) capsule 25-50 mg  25-50 mg Oral Q4H PRN Gwenyth Bender, MD       Or  .  diphenhydrAMINE (BENADRYL) injection 12.5-25 mg  12.5-25 mg Intravenous Q4H PRN Gwenyth Bender, MD   25 mg at 06/02/11 1048  . enoxaparin (LOVENOX) injection 40 mg  40 mg Subcutaneous Q24H Keitha Butte, NP   40 mg at 06/01/11 2243  . folic acid (FOLVITE) tablet 1 mg  1 mg Oral Daily Gwenyth Bender, MD   1 mg at 06/02/11 1048  . HYDROmorphone (DILAUDID) injection 2-4 mg  2-4 mg Intravenous Q2H Keitha Butte, NP   4 mg at 06/02/11 1235  . hydroxyurea (HYDREA) capsule 500 mg  500 mg Oral BID Keitha Butte, NP   500 mg at 06/02/11 1048  . ketorolac (TORADOL) 30 MG/ML injection 30 mg  30 mg Intravenous Q8H Keitha Butte, NP   30 mg at 06/02/11 0644  . lactulose (CHRONULAC) 10 GM/15ML solution 20 g  20 g Oral BID Keitha Butte, NP   20 g at 06/02/11 1048  . levalbuterol (XOPENEX) nebulizer solution 0.63 mg  0.63 mg Nebulization Q4H PRN Gwenyth Bender, MD      . lidocaine (LIDODERM) 5 % 1 patch  1 patch Transdermal Q24H Keitha Butte, NP   1 patch at 06/02/11 1059  . moxifloxacin (AVELOX) IVPB 400 mg  400 mg Intravenous Q24H Keitha Butte, NP   400 mg at 06/01/11 1837  . OLANZapine (ZYPREXA) tablet 10 mg  10 mg Oral QHS Keitha Butte, NP   10 mg at 06/01/11 2258  . ondansetron (ZOFRAN) injection 4 mg  4 mg Intravenous Q4H Keitha Butte, NP   4 mg at 06/02/11 1047  . oxyCODONE (OXYCONTIN) 12 hr tablet 80 mg  80 mg Oral Q12H Keitha Butte, NP   80 mg at 06/02/11 1048  . sodium chloride 0.9 % injection 10-40 mL  10-40 mL Intracatheter PRN Gwenyth Bender, MD   10 mL at 06/01/11 1728  . sodium chloride 0.9 % injection 3 mL  3 mL Intravenous Q12H Keitha Butte, NP   3 mL at 06/02/11 0850  . DISCONTD: aztreonam (AZACTAM) 1 g in dextrose 5 % 50 mL IVPB  1 g Intravenous Q8H Rollene Fare, PHARMD   1 g at 06/01/11 2057  . DISCONTD: levalbuterol (XOPENEX) nebulizer solution 0.63 mg  0.63 mg Nebulization BID Keitha Butte, NP   0.63 mg at 06/01/11 2107    Assessment/Plan: Patient Active Problem List  Diagnoses  . Sickle cell pain crisis  . Leukocytosis  .  Chronic pain  . Hypokalemia  . Metabolic acidosis  . Thrombocytosis  . Hemochromatosis  . Hypomagnesemia  . Acute chest syndrome due to sickle cell crisis  . Diarrhea  . Elevated brain natriuretic peptide (BNP) level  . Bronchitis  . Sickle cell anemia  . Acute embolism and thrombosis of right internal jugular vein  . Avascular necrosis  . Metabolic encephalopathy   Sickle Cell Crisis/Chronic Pain:  The patient will have a blood exchange today. The patient will continue pain/nausea/pruritis/bowel management, IV hydration, home medications,  DVT prophylaxis. CMP, Mg, ProBNP and CBC in the am.   Acute chest syndrome due to sickle cell crisis: The patient is receiving antibiotic therapy with Avelox and Aztreonam, nebulizer treatments are available PRN,  IS usage is encouraged. Hypokalemia:  WNL - The patient will continue receiving IVFs with 20 mEq KCL - will continue to monitor Elevated Ammonia Level:  The patient is receiving Lactulose BID - will recheck ammonia level in the am  Discussed and agreed upon plan of care with the patient.  Plan of care will be adjusted based on the patient's clinical progress.   Larina Bras, NP-C 06/02/2011, 1:02 PM

## 2011-06-02 NOTE — Progress Notes (Signed)
ANTIBIOTIC CONSULT NOTE - Follow Up  Pharmacy Consult for Aztreonam Indication: Acute Chest Syndrome  Allergies  Allergen Reactions  . Morphine And Related Hives and Rash    Pt states he is able to tolerate Dilaudid with no reactions.    Patient Measurements: Wt: 75.5kg as of 05/30/11  Vital Signs: Temp: 98.2 F (36.8 C) (05/02 1610) Temp src: Oral (05/02 0608) BP: 127/77 mmHg (05/02 9604) Pulse Rate: 72  (05/02 0608) Intake/Output from previous day: 05/01 0701 - 05/02 0700 In: 1770 [P.O.:720; I.V.:1000; IV Piggyback:50] Out: -  Intake/Output from this shift: Total I/O In: 240 [P.O.:240] Out: -   Labs:  Basename 06/02/11 0640 06/01/11 0528 05/31/11 1910  WBC 18.7* 21.4* 19.1*  HGB 8.0* 8.7* 8.2*  PLT 414* 430* 393  LABCREA -- -- --  CREATININE 0.61 0.64 0.69   CrCl > 150ml/min   Microbiology: Recent Results (from the past 720 hour(s))  MRSA PCR SCREENING     Status: Normal   Collection Time   05/31/11 10:14 AM      Component Value Range Status Comment   MRSA by PCR NEGATIVE  NEGATIVE  Final     Medical History: Past Medical History  Diagnosis Date  . Sickle cell anemia   . Blood transfusion   . 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 Hypokalemia   . Mood disorder   . Pulmonary embolism   . Avascular necrosis   . Leukocytosis     Chronic  . Thrombocytosis     Chronic    Medications:  Scheduled:     . acetaminophen  650 mg Oral Once  . aztreonam  1 g Intravenous Q8H  . deferoxamine (DESFERAL) IV  2,000 mg Intravenous QHS  . diclofenac sodium   Topical QID  . diphenhydrAMINE  25 mg Intravenous Once  . enoxaparin  40 mg Subcutaneous Q24H  . folic acid  1 mg Oral Daily  .  HYDROmorphone (DILAUDID) injection  2-4 mg Intravenous Q2H  . hydroxyurea  500 mg Oral BID  . ketorolac  30 mg Intravenous Q8H  . lactulose  20 g Oral BID  . lidocaine  1 patch Transdermal Q24H  . moxifloxacin  400 mg Intravenous Q24H  . OLANZapine   10 mg Oral QHS  . ondansetron (ZOFRAN) IV  4 mg Intravenous Q4H  . oxyCODONE  80 mg Oral Q12H  . sodium chloride  3 mL Intravenous Q12H  . DISCONTD: aztreonam  1 g Intravenous Q8H  . DISCONTD: levalbuterol  0.63 mg Nebulization BID   Assessment: 31 YOM with sickle cell crisis on Day # 4 Avelox/Aztreonam for acute chest syndrome. Renal function stable, WBC trending down, Tm = 99 in the last 24 hours. Aztreonam dose remains appropriate for renal function.  Goal of Therapy:  Empiric coverage of infection  Plan:   Continue Aztreonam 1g q8h  Follow renal function and duration of therapy  Darrol Angel, PharmD Pager: [phone] 06/02/2011,1:40 PM

## 2011-06-03 DIAGNOSIS — D5700 Hb-SS disease with crisis, unspecified: Secondary | ICD-10-CM | POA: Diagnosis not present

## 2011-06-03 LAB — TYPE AND SCREEN: ABO/RH(D): O POS

## 2011-06-03 LAB — COMPREHENSIVE METABOLIC PANEL
AST: 45 U/L — ABNORMAL HIGH (ref 0–37)
Albumin: 3.8 g/dL (ref 3.5–5.2)
Alkaline Phosphatase: 97 U/L (ref 39–117)
Chloride: 108 mEq/L (ref 96–112)
Potassium: 3.8 mEq/L (ref 3.5–5.1)
Sodium: 138 mEq/L (ref 135–145)
Total Bilirubin: 3.9 mg/dL — ABNORMAL HIGH (ref 0.3–1.2)
Total Protein: 6.8 g/dL (ref 6.0–8.3)

## 2011-06-03 LAB — PREPARE RBC (CROSSMATCH)

## 2011-06-03 LAB — MAGNESIUM: Magnesium: 1.8 mg/dL (ref 1.5–2.5)

## 2011-06-03 LAB — CBC
MCHC: 34.2 g/dL (ref 30.0–36.0)
Platelets: 356 10*3/uL (ref 150–400)
RDW: 17.2 % — ABNORMAL HIGH (ref 11.5–15.5)
WBC: 15.3 10*3/uL — ABNORMAL HIGH (ref 4.0–10.5)

## 2011-06-03 MED ORDER — DEXTROSE 5 % IV SOLN
2000.0000 mg | Freq: Every day | INTRAVENOUS | Status: AC
  Administered 2011-06-04 – 2011-06-05 (×2): 2000 mg via INTRAVENOUS
  Filled 2011-06-03 (×2): qty 2

## 2011-06-03 MED ORDER — ACETAMINOPHEN 325 MG PO TABS
650.0000 mg | ORAL_TABLET | Freq: Once | ORAL | Status: AC
  Administered 2011-06-03: 650 mg via ORAL
  Filled 2011-06-03: qty 2

## 2011-06-03 MED ORDER — LACTULOSE 10 GM/15ML PO SOLN
20.0000 g | Freq: Two times a day (BID) | ORAL | Status: AC
  Administered 2011-06-03: 20 g via ORAL
  Filled 2011-06-03: qty 30

## 2011-06-03 MED ORDER — POLYETHYLENE GLYCOL 3350 17 G PO PACK
17.0000 g | PACK | Freq: Every day | ORAL | Status: DC
  Administered 2011-06-06: 17 g via ORAL
  Filled 2011-06-03 (×5): qty 1

## 2011-06-03 MED ORDER — LIDOCAINE 5 % EX PTCH
3.0000 | MEDICATED_PATCH | CUTANEOUS | Status: DC
  Administered 2011-06-04 – 2011-06-08 (×4): 3 via TRANSDERMAL
  Filled 2011-06-03 (×5): qty 3

## 2011-06-03 MED ORDER — DIPHENHYDRAMINE HCL 50 MG/ML IJ SOLN
25.0000 mg | Freq: Once | INTRAMUSCULAR | Status: AC
  Administered 2011-06-03: 25 mg via INTRAVENOUS

## 2011-06-03 NOTE — Progress Notes (Signed)
UTILIZATION REVIEW COMPLETED. 

## 2011-06-03 NOTE — Progress Notes (Signed)
Subjective: The patient was seen on rounds today.  The patient was sitting in his bed, watching television.  The patient is rating his pain at a 6/10 in his bilateral flank areas, bilateral pectoral pain and bilateral LE pain (thigh area).  The patient also states that the Lactulose is giving him diarrhea.  Let the patient know that I will change his bowel regimen, but his ammonia level is still slightly elevated. The patient does not have any other complaints at this time.  Let the patient know that we will do a blood exchange x 2 once blood is ready.  Encouraged the patient to use of his IS often.  No other nursing or patient concerns.  Objective: Vital signs in last 24 hours: Blood pressure 117/78, pulse 62, temperature 98.7 F (37.1 C), temperature source Oral, resp. rate 16, height 6' (1.829 m), weight 166 lb 7.2 oz (75.5 kg), SpO2 100.00%.  General Appearance: Alert and oriented, cooperative, well developed, well nourished, mild distress Head: Normocephalic, without obvious abnormality, atraumatic Eyes: PERRLA, EOMI, the patient has slight strabismus, scleral icterus Nose: Nares normal, septum midline, mucosa normal, no drainage or sinus tenderness. Throat:  Normal lips, mucosa, tongue and gums Back: Symmetric, bilateral CVA/flank tenderness, no back tenderness Resp: CTA, Diminished bibasilar breath sounds, no wheezes/rales/rhonchi, pectoral tenderness Cardio: S1, S2 regular, no murmurs/clicks/rubs/gallops GI: Soft, non-tender, normoactive bowel sounds, no masses, no organomegaly Male Genitalia: Deferred, the patient denies priapism Extremities: Extremities normal, atraumatic, no cyanosis or edema, negative Homans sign, no sign of DVT, bilateral thigh tenderness Pulses: 2+ and symmetric Skin: Skin color, texture, turgor normal, no rashes or lesions, numerous tattoos Neurologic: Grossly normal, CN II-XII intact, no focal deficits Psych:  Appropriate affect  Lab Results: Results for  orders placed during the hospital encounter of 05/30/11 (from the past 24 hour(s))  CBC     Status: Abnormal   Collection Time   06/03/11  3:30 AM      Component Value Range   WBC 15.3 (*) 4.0 - 10.5 (K/uL)   RBC 2.72 (*) 4.22 - 5.81 (MIL/uL)   Hemoglobin 8.2 (*) 13.0 - 17.0 (g/dL)   HCT 45.4 (*) 09.8 - 52.0 (%)   MCV 88.2  78.0 - 100.0 (fL)   MCH 30.1  26.0 - 34.0 (pg)   MCHC 34.2  30.0 - 36.0 (g/dL)   RDW 11.9 (*) 14.7 - 15.5 (%)   Platelets 356  150 - 400 (K/uL)  COMPREHENSIVE METABOLIC PANEL     Status: Abnormal   Collection Time   06/03/11  3:30 AM      Component Value Range   Sodium 138  135 - 145 (mEq/L)   Potassium 3.8  3.5 - 5.1 (mEq/L)   Chloride 108  96 - 112 (mEq/L)   CO2 20  19 - 32 (mEq/L)   Glucose, Bld 104 (*) 70 - 99 (mg/dL)   BUN 5 (*) 6 - 23 (mg/dL)   Creatinine, Ser 8.29  0.50 - 1.35 (mg/dL)   Calcium 8.5  8.4 - 56.2 (mg/dL)   Total Protein 6.8  6.0 - 8.3 (g/dL)   Albumin 3.8  3.5 - 5.2 (g/dL)   AST 45 (*) 0 - 37 (U/L)   ALT 48  0 - 53 (U/L)   Alkaline Phosphatase 97  39 - 117 (U/L)   Total Bilirubin 3.9 (*) 0.3 - 1.2 (mg/dL)   GFR calc non Af Amer >90  >90 (mL/min)   GFR calc Af Amer >90  >90 (mL/min)  MAGNESIUM     Status: Normal   Collection Time   06/03/11  3:30 AM      Component Value Range   Magnesium 1.8  1.5 - 2.5 (mg/dL)  AMMONIA     Status: Abnormal   Collection Time   06/03/11  3:30 AM      Component Value Range   Ammonia 80 (*) 11 - 60 (umol/L)  PRO B NATRIURETIC PEPTIDE     Status: Abnormal   Collection Time   06/03/11  3:30 AM      Component Value Range   Pro B Natriuretic peptide (BNP) 399.7 (*) 0 - 125 (pg/mL)    Studies/Results: No results found. Medications:  Allergies  Allergen Reactions  . Morphine And Related Hives and Rash    Pt states he is able to tolerate Dilaudid with no reactions.    Current Facility-Administered Medications  Medication Dose Route Frequency Provider Last Rate Last Dose  . acetaminophen (TYLENOL) tablet  650 mg  650 mg Oral Q4H PRN Keitha Butte, NP      . acetaminophen (TYLENOL) tablet 650 mg  650 mg Oral Once Keitha Butte, NP      . aztreonam (AZACTAM) 1 g in dextrose 5 % 50 mL IVPB  1 g Intravenous Q8H Gwenyth Bender, MD   1 g at 06/03/11 1200  . deferoxamine (DESFERAL) 2,000 mg in dextrose 5 % 500 mL infusion  2,000 mg Intravenous QHS Keitha Butte, NP      . dextrose 5 % and 0.45 % NaCl with KCl 20 mEq/L infusion   Intravenous Continuous Keitha Butte, NP 75 mL/hr at 06/03/11 0500    . diclofenac sodium (VOLTAREN) 1 % transdermal gel   Topical QID Keitha Butte, NP      . diphenhydrAMINE (BENADRYL) capsule 25-50 mg  25-50 mg Oral Q4H PRN Gwenyth Bender, MD       Or  . diphenhydrAMINE (BENADRYL) injection 12.5-25 mg  12.5-25 mg Intravenous Q4H PRN Gwenyth Bender, MD   25 mg at 06/03/11 1438  . diphenhydrAMINE (BENADRYL) injection 25 mg  25 mg Intravenous Once Keitha Butte, NP      . diphenhydrAMINE (BENADRYL) injection 25 mg  25 mg Intravenous Once Keitha Butte, NP      . enoxaparin (LOVENOX) injection 40 mg  40 mg Subcutaneous Q24H Keitha Butte, NP   40 mg at 06/03/11 0020  . folic acid (FOLVITE) tablet 1 mg  1 mg Oral Daily Gwenyth Bender, MD   1 mg at 06/03/11 1038  . HYDROmorphone (DILAUDID) injection 2-4 mg  2-4 mg Intravenous Q2H Keitha Butte, NP   4 mg at 06/03/11 1437  . hydroxyurea (HYDREA) capsule 500 mg  500 mg Oral BID Keitha Butte, NP   500 mg at 06/03/11 1038  . ketorolac (TORADOL) 30 MG/ML injection 30 mg  30 mg Intravenous Q8H Keitha Butte, NP   30 mg at 06/03/11 1437  . lactulose (CHRONULAC) 10 GM/15ML solution 20 g  20 g Oral BID Keitha Butte, NP      . levalbuterol (XOPENEX) nebulizer solution 0.63 mg  0.63 mg Nebulization Q4H PRN Gwenyth Bender, MD      . lidocaine (LIDODERM) 5 % 3 patch  3 patch Transdermal Q24H Keitha Butte, NP      . moxifloxacin (AVELOX) IVPB 400 mg  400 mg Intravenous Q24H  Keitha Butte, NP   400  mg at 06/02/11 1842  . OLANZapine (ZYPREXA) tablet 10 mg  10 mg Oral QHS Keitha Butte, NP   10 mg at 06/03/11 0019  . ondansetron (ZOFRAN) injection 4 mg  4 mg Intravenous Q4H Keitha Butte, NP   4 mg at 06/03/11 1438  . oxyCODONE (OXYCONTIN) 12 hr tablet 80 mg  80 mg Oral Q12H Keitha Butte, NP   80 mg at 06/03/11 1038  . polyethylene glycol (MIRALAX / GLYCOLAX) packet 17 g  17 g Oral Daily Keitha Butte, NP      . sodium chloride 0.9 % injection 10-40 mL  10-40 mL Intracatheter PRN Gwenyth Bender, MD   10 mL at 06/03/11 0339  . sodium chloride 0.9 % injection 3 mL  3 mL Intravenous Q12H Keitha Butte, NP   3 mL at 06/02/11 0850  . DISCONTD: deferoxamine (DESFERAL) 2,000 mg in dextrose 5 % 500 mL infusion  2,000 mg Intravenous QHS Keitha Butte, NP 125 mL/hr at 06/02/11 2241 2,000 mg at 06/02/11 2241  . DISCONTD: lactulose (CHRONULAC) 10 GM/15ML solution 20 g  20 g Oral BID Keitha Butte, NP   20 g at 06/03/11 1038  . DISCONTD: lidocaine (LIDODERM) 5 % 1 patch  1 patch Transdermal Q24H Keitha Butte, NP   1 patch at 06/03/11 1038   Assessment/Plan: Patient Active Problem List  Diagnoses  . Sickle cell pain crisis  . Leukocytosis  . Chronic pain  . Hypokalemia  . Metabolic acidosis  . Thrombocytosis  . Hemochromatosis  . Hypomagnesemia  . Acute chest syndrome due to sickle cell crisis  . Diarrhea  . Elevated brain natriuretic peptide (BNP) level  . Bronchitis  . Sickle cell anemia  . Acute embolism and thrombosis of right internal jugular vein  . Avascular necrosis  . Metabolic encephalopathy   Sickle Cell Crisis/Chronic Pain:  The patient is in active hemolysis. The patient has blood exchange x 2 ordered. The patient will continue pain/nausea/pruritis/bowel management, IV hydration, home medications,  DVT prophylaxis. CMP and CBC in the am.   Acute chest syndrome due to sickle cell crisis: The patient is  receiving antibiotic therapy with Avelox and Aztreonam, nebulizer treatments are available PRN,  IS usage is encouraged. Hypokalemia:  WNL - The patient will continue receiving IVFs with 20 mEq KCL - will continue to monitor Elevated Ammonia Level:  Will recheck ammonia level on 06/06/11 Hemochromatosis:  The patient is receiving nightly Desferal infusions - will recheck Ferritin on 06/06/11 Elevated BNP:  The patient's IVFs have been decreased to 50 ml/hr - will recheck ProBNP in the am  Discussed and agreed upon plan of care with the patient.  Plan of care will be adjusted based on the patient's clinical progress.   Larina Bras, NP-C 06/03/2011, 3:55 PM

## 2011-06-04 DIAGNOSIS — M2550 Pain in unspecified joint: Secondary | ICD-10-CM | POA: Diagnosis not present

## 2011-06-04 DIAGNOSIS — D5700 Hb-SS disease with crisis, unspecified: Secondary | ICD-10-CM | POA: Diagnosis not present

## 2011-06-04 LAB — CBC
Hemoglobin: 9.4 g/dL — ABNORMAL LOW (ref 13.0–17.0)
MCH: 30.6 pg (ref 26.0–34.0)
MCHC: 35.5 g/dL (ref 30.0–36.0)
MCV: 86.3 fL (ref 78.0–100.0)
RBC: 3.07 MIL/uL — ABNORMAL LOW (ref 4.22–5.81)

## 2011-06-04 LAB — COMPREHENSIVE METABOLIC PANEL
BUN: 6 mg/dL (ref 6–23)
CO2: 22 mEq/L (ref 19–32)
Calcium: 8.2 mg/dL — ABNORMAL LOW (ref 8.4–10.5)
Creatinine, Ser: 0.58 mg/dL (ref 0.50–1.35)
GFR calc Af Amer: >90 mL/min (ref >90)
GFR calc non Af Amer: >90 mL/min (ref >90)
Glucose, Bld: 99 mg/dL (ref 70–99)
Total Protein: 6.9 g/dL (ref 6.0–8.3)

## 2011-06-04 LAB — PRO B NATRIURETIC PEPTIDE: Pro B Natriuretic peptide (BNP): 254 pg/mL — ABNORMAL HIGH (ref 0–125)

## 2011-06-04 NOTE — Progress Notes (Signed)
Subjective:  No new complaints.  Objective:  Vital Signs in the last 24 hours: Temp:  [97.9 F (36.6 C)-99 F (37.2 C)] 98.5 F (36.9 C) (05/04 0350) Pulse Rate:  [60-97] 97  (05/04 0350) Cardiac Rhythm:  [-]  Resp:  [16-20] 18  (05/04 0620) BP: (117-152)/(77-93) 148/93 mmHg (05/04 0350) SpO2:  [100 %] 100 % (05/03 2030)  Physical Exam: BP Readings from Last 1 Encounters:  06/04/11 148/93    Wt Readings from Last 1 Encounters:  05/30/11 75.5 kg (166 lb 7.2 oz)    Weight change:   HEENT: [place]/AT, Eyes-Brown, PERL, EOMI, Conjunctiva-Pale pink, Sclera-icteric Neck: No JVD, No bruit, Trachea midline. Lungs:  Clear, Bilateral. Cardiac:  Regular rhythm, normal S1 and S2, no S3.  Abdomen:  Soft, non-tender. Extremities:  No edema present. No cyanosis. No clubbing. CNS: AxOx3, Cranial nerves grossly intact, moves all 4 extremities. Right handed. Skin: Warm and dry.   Intake/Output from previous day: 05/03 0701 - 05/04 0700 In: 1460 [P.O.:960; I.V.:500] Out: 400 [Blood:200]    Lab Results: BMET    Component Value Date/Time   NA 139 06/04/2011 0800   K 4.0 06/04/2011 0800   CL 107 06/04/2011 0800   CO2 22 06/04/2011 0800   GLUCOSE 99 06/04/2011 0800   BUN 6 06/04/2011 0800   CREATININE 0.58 06/04/2011 0800   CALCIUM 8.2* 06/04/2011 0800   GFRNONAA >90 06/04/2011 0800   GFRAA >90 06/04/2011 0800   CBC    Component Value Date/Time   WBC 15.2* 06/04/2011 0800   RBC 3.07* 06/04/2011 0800   HGB 9.4* 06/04/2011 0800   HCT 26.5* 06/04/2011 0800   PLT 370 06/04/2011 0800   MCV 86.3 06/04/2011 0800   MCH 30.6 06/04/2011 0800   MCHC 35.5 06/04/2011 0800   RDW 17.5* 06/04/2011 0800   LYMPHSABS 2.4 05/30/2011 0910   MONOABS 2.1* 05/30/2011 0910   EOSABS 0.2 05/30/2011 0910   BASOSABS 0.1 05/30/2011 0910   CARDIAC ENZYMES Lab Results  Component Value Date   CKTOTAL 136 04/23/2011   CKMB 1.5 04/23/2011   TROPONINI <0.30 04/23/2011    Assessment/Plan:  Patient Active Hospital Problem List: Sickle cell pain  crisis  Acute chest syndrome due to sickle cell crisis (02/18/2011) Leukocytosis Hemochromatosis Sickle cell anemia Avascular necrosis  Continue medical treatment   LOS: 5 days    Orpah Cobb  MD  06/04/2011, 10:34 AM

## 2011-06-04 NOTE — Progress Notes (Signed)
Patient pleasant and cooperative all shift. Patient began getting confused and forgetful this morning, starting to verbally abuse staff and refusing blood vitals, stating that he has not been getting his pain medication on time when it is scheduled when in fact he has been. Have been in room multiple times every hour attending to patient's needs, blood transfusing, and giving pain and PRN medications. Patient talking to himself in his room, very agitated. Will continue to assess patient. MD on call for Dr. [name] already paged and informed. No orders given.

## 2011-06-05 DIAGNOSIS — M2550 Pain in unspecified joint: Secondary | ICD-10-CM | POA: Diagnosis not present

## 2011-06-05 DIAGNOSIS — D5700 Hb-SS disease with crisis, unspecified: Secondary | ICD-10-CM | POA: Diagnosis not present

## 2011-06-05 LAB — TYPE AND SCREEN
ABO/RH(D): O POS
Antibody Screen: NEGATIVE
Unit division: 0

## 2011-06-05 NOTE — Progress Notes (Signed)
ANTIBIOTIC CONSULT NOTE - Follow Up  Pharmacy Consult for Aztreonam Indication: Acute Chest Syndrome  Allergies  Allergen Reactions  . Morphine And Related Hives and Rash    Pt states he is able to tolerate Dilaudid with no reactions.    Patient Measurements: Wt: 75.5kg as of 05/30/11  Vital Signs: Temp: 98.4 F (36.9 C) (05/05 0513) Temp src: Oral (05/05 0513) BP: 111/71 mmHg (05/05 0513) Pulse Rate: 63  (05/05 0513) Intake/Output from previous day: 05/04 0701 - 05/05 0700 In: 5564.7 [P.O.:720; I.V.:2726.7; IV Piggyback:2118] Out: -  Intake/Output from this shift:    Labs:  Basename 06/04/11 0800 06/03/11 0330  WBC 15.2* 15.3*  HGB 9.4* 8.2*  PLT 370 356  LABCREA -- --  CREATININE 0.58 0.58   CrCl > 119ml/min   Microbiology: Recent Results (from the past 720 hour(s))  MRSA PCR SCREENING     Status: Normal   Collection Time   05/31/11 10:14 AM      Component Value Range Status Comment   MRSA by PCR NEGATIVE  NEGATIVE  Final     Medical History: Past Medical History  Diagnosis Date  . Sickle cell anemia   . Blood transfusion   . 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 Hypokalemia   . Mood disorder   . Pulmonary embolism   . Avascular necrosis   . Leukocytosis     Chronic  . Thrombocytosis     Chronic    Medications:  Scheduled:     . aztreonam  1 g Intravenous Q8H  . deferoxamine (DESFERAL) IV  2,000 mg Intravenous QHS  . diclofenac sodium   Topical QID  . diphenhydrAMINE  25 mg Intravenous Once  . enoxaparin  40 mg Subcutaneous Q24H  . folic acid  1 mg Oral Daily  .  HYDROmorphone (DILAUDID) injection  2-4 mg Intravenous Q2H  . hydroxyurea  500 mg Oral BID  . ketorolac  30 mg Intravenous Q8H  . lidocaine  3 patch Transdermal Q24H  . moxifloxacin  400 mg Intravenous Q24H  . OLANZapine  10 mg Oral QHS  . ondansetron (ZOFRAN) IV  4 mg Intravenous Q4H  . oxyCODONE  80 mg Oral Q12H  . polyethylene glycol  17 g Oral  Daily  . sodium chloride  3 mL Intravenous Q12H   Assessment:  31 YOM with sickle cell crisis on Day #7 Avelox/Aztreonam for acute chest syndrome.  Renal function stable  WBC slightly elevated  Tmax 99.1  Goal of Therapy:  Empiric coverage of infection  Plan:   Continue Aztreonam 1g q8h  F/u daily.  MD: Consider narrowing or stopping abx soon. If Avelox is to continue, consider changing to PO.  Charolotte Eke, PharmD, pager [phone]. 06/05/2011,9:53 AM.

## 2011-06-05 NOTE — Progress Notes (Signed)
Subjective:  Feeling better.  Objective:  Vital Signs in the last 24 hours: Temp:  [98.4 F (36.9 C)-99.1 F (37.3 C)] 98.4 F (36.9 C) (05/05 0513) Pulse Rate:  [63-78] 63  (05/05 0513) Cardiac Rhythm:  [-]  Resp:  [18] 18  (05/05 0513) BP: (111-148)/(71-89) 111/71 mmHg (05/05 0513) SpO2:  [98 %-100 %] 100 % (05/05 0513)  Physical Exam: BP Readings from Last 1 Encounters:  06/05/11 111/71    Wt Readings from Last 1 Encounters:  05/30/11 75.5 kg (166 lb 7.2 oz)    Weight change:   HEENT: [place]/AT, Eyes-Brown, PERL, EOMI, Conjunctiva-Pale pink, Sclera-icteric Neck: No JVD, No bruit, Trachea midline. Lungs:  Clear, Bilateral. Cardiac:  Regular rhythm, normal S1 and S2, no S3.  Abdomen:  Soft, non-tender. Extremities:  No edema present. No cyanosis. No clubbing. CNS: AxOx3, Cranial nerves grossly intact, moves all 4 extremities. Right handed. Skin: Warm and dry.   Intake/Output from previous day: 05/04 0701 - 05/05 0700 In: 5564.7 [P.O.:720; I.V.:2726.7; IV Piggyback:2118] Out: -     Lab Results: BMET    Component Value Date/Time   NA 139 06/04/2011 0800   K 4.0 06/04/2011 0800   CL 107 06/04/2011 0800   CO2 22 06/04/2011 0800   GLUCOSE 99 06/04/2011 0800   BUN 6 06/04/2011 0800   CREATININE 0.58 06/04/2011 0800   CALCIUM 8.2* 06/04/2011 0800   GFRNONAA >90 06/04/2011 0800   GFRAA >90 06/04/2011 0800   CBC    Component Value Date/Time   WBC 15.2* 06/04/2011 0800   RBC 3.07* 06/04/2011 0800   HGB 9.4* 06/04/2011 0800   HCT 26.5* 06/04/2011 0800   PLT 370 06/04/2011 0800   MCV 86.3 06/04/2011 0800   MCH 30.6 06/04/2011 0800   MCHC 35.5 06/04/2011 0800   RDW 17.5* 06/04/2011 0800   LYMPHSABS 2.4 05/30/2011 0910   MONOABS 2.1* 05/30/2011 0910   EOSABS 0.2 05/30/2011 0910   BASOSABS 0.1 05/30/2011 0910   CARDIAC ENZYMES Lab Results  Component Value Date   CKTOTAL 136 04/23/2011   CKMB 1.5 04/23/2011   TROPONINI <0.30 04/23/2011    Assessment/Plan:  Patient Active Hospital Problem  List:  Sickle cell pain crisis  Acute chest syndrome due to sickle cell crisis (02/18/2011) Leukocytosis  Hemochromatosis  Sickle cell anemia  Avascular necrosis   Continue medical treatment    LOS: 6 days    Orpah Cobb  MD  06/05/2011, 10:59 AM

## 2011-06-06 DIAGNOSIS — D5700 Hb-SS disease with crisis, unspecified: Secondary | ICD-10-CM | POA: Diagnosis not present

## 2011-06-06 LAB — CBC
Hemoglobin: 9.8 g/dL — ABNORMAL LOW (ref 13.0–17.0)
MCH: 30.2 pg (ref 26.0–34.0)
Platelets: 434 10*3/uL — ABNORMAL HIGH (ref 150–400)
RBC: 3.24 MIL/uL — ABNORMAL LOW (ref 4.22–5.81)
WBC: 12.6 10*3/uL — ABNORMAL HIGH (ref 4.0–10.5)

## 2011-06-06 LAB — COMPREHENSIVE METABOLIC PANEL
ALT: 52 U/L (ref 0–53)
AST: 50 U/L — ABNORMAL HIGH (ref 0–37)
Alkaline Phosphatase: 112 U/L (ref 39–117)
CO2: 23 mEq/L (ref 19–32)
Calcium: 9 mg/dL (ref 8.4–10.5)
Chloride: 104 mEq/L (ref 96–112)
GFR calc non Af Amer: >90 mL/min (ref >90)
Potassium: 4 mEq/L (ref 3.5–5.1)
Sodium: 136 mEq/L (ref 135–145)

## 2011-06-06 MED ORDER — HALOPERIDOL 5 MG PO TABS
5.0000 mg | ORAL_TABLET | Freq: Three times a day (TID) | ORAL | Status: DC
  Filled 2011-06-06 (×2): qty 1

## 2011-06-06 MED ORDER — ONDANSETRON HCL 4 MG/2ML IJ SOLN
4.0000 mg | INTRAMUSCULAR | Status: DC | PRN
  Administered 2011-06-06 – 2011-06-08 (×3): 4 mg via INTRAVENOUS
  Filled 2011-06-06 (×4): qty 2

## 2011-06-06 MED ORDER — DEXTROSE 5 % IV SOLN
2000.0000 mg | Freq: Every day | INTRAVENOUS | Status: DC
  Administered 2011-06-06 – 2011-06-07 (×2): 2000 mg via INTRAVENOUS
  Filled 2011-06-06 (×3): qty 2

## 2011-06-06 MED ORDER — HALOPERIDOL 0.5 MG PO TABS
0.5000 mg | ORAL_TABLET | Freq: Three times a day (TID) | ORAL | Status: DC
  Administered 2011-06-06 – 2011-06-08 (×6): 0.5 mg via ORAL
  Filled 2011-06-06 (×8): qty 1

## 2011-06-06 MED ORDER — MOXIFLOXACIN HCL 400 MG PO TABS
400.0000 mg | ORAL_TABLET | Freq: Every day | ORAL | Status: DC
  Administered 2011-06-06 – 2011-06-07 (×2): 400 mg via ORAL
  Filled 2011-06-06 (×3): qty 1

## 2011-06-06 NOTE — Progress Notes (Signed)
Subjective: The patient was seen on rounds today.  The patient was sitting in his bed, watching television.  The patient is rating his pain at a 5-6/10 in his bilateral flank areas, midsternal discomfort, bilateral LE pain (thigh area) and LUE (shoulder pain).  The patient stated that his bilateral rib pain is better. Received a note from pharmacy that the patient had been refusing laboratory draws and has become aggressive with some of the staff members.  Spoke to the patient about the concerns.  I will start the patient on low-dose Haldol as this has proven effective in the past for his behavioral issues.  Encouraged the patient to use of his IS often.  No other nursing or patient concerns.  Objective: Vital signs in last 24 hours: Blood pressure 128/85, pulse 79, temperature 97.6 F (36.4 C), temperature source Oral, resp. rate 18, height 6' (1.829 m), weight 166 lb 7.2 oz (75.5 kg), SpO2 99.00%.  General Appearance: Alert and oriented, cooperative, well developed, well nourished, mild distress Head: Normocephalic, without obvious abnormality, atraumatic Eyes: PERRLA, EOMI, the patient has slight strabismus, scleral icterus Nose: Nares normal, septum midline, mucosa normal, no drainage or sinus tenderness. Throat:  Normal lips, mucosa, tongue and gums Back: Symmetric, bilateral CVA/flank tenderness, no back tenderness Resp: CTA, Diminished bibasilar breath sounds, no wheezes/rales/rhonchi, midsternal tenderness Cardio: S1, S2 regular, no murmurs/clicks/rubs/gallops GI: Soft, non-tender, normoactive bowel sounds, no masses, no organomegaly Male Genitalia: Deferred, the patient denies priapism Extremities: Extremities normal, atraumatic, no cyanosis or edema, negative Homans sign, no sign of DVT, bilateral thigh tenderness, left shoulder tenderness Pulses: 2+ and symmetric Skin: Skin color, texture, turgor normal, no rashes or lesions, numerous tattoos Neurologic: Grossly normal, CN II-XII  intact, no focal deficits Psych:  Appropriate affect  Lab Results: Results for orders placed during the hospital encounter of 05/30/11 (from the past 24 hour(s))  CBC     Status: Abnormal   Collection Time   06/06/11  5:35 AM      Component Value Range   WBC 12.6 (*) 4.0 - 10.5 (K/uL)   RBC 3.24 (*) 4.22 - 5.81 (MIL/uL)   Hemoglobin 9.8 (*) 13.0 - 17.0 (g/dL)   HCT 40.9 (*) 81.1 - 52.0 (%)   MCV 87.7  78.0 - 100.0 (fL)   MCH 30.2  26.0 - 34.0 (pg)   MCHC 34.5  30.0 - 36.0 (g/dL)   RDW 91.4 (*) 78.2 - 15.5 (%)   Platelets 434 (*) 150 - 400 (K/uL)  COMPREHENSIVE METABOLIC PANEL     Status: Abnormal   Collection Time   06/06/11  5:35 AM      Component Value Range   Sodium 136  135 - 145 (mEq/L)   Potassium 4.0  3.5 - 5.1 (mEq/L)   Chloride 104  96 - 112 (mEq/L)   CO2 23  19 - 32 (mEq/L)   Glucose, Bld 121 (*) 70 - 99 (mg/dL)   BUN 10  6 - 23 (mg/dL)   Creatinine, Ser 9.56  0.50 - 1.35 (mg/dL)   Calcium 9.0  8.4 - 21.3 (mg/dL)   Total Protein 7.6  6.0 - 8.3 (g/dL)   Albumin 3.9  3.5 - 5.2 (g/dL)   AST 50 (*) 0 - 37 (U/L)   ALT 52  0 - 53 (U/L)   Alkaline Phosphatase 112  39 - 117 (U/L)   Total Bilirubin 3.4 (*) 0.3 - 1.2 (mg/dL)   GFR calc non Af Amer >90  >90 (mL/min)   GFR  calc Af Amer >90  >90 (mL/min)  FERRITIN     Status: Abnormal   Collection Time   06/06/11  5:35 AM      Component Value Range   Ferritin 2354 (*) 22 - 322 (ng/mL)  AMMONIA     Status: Abnormal   Collection Time   06/06/11  5:35 AM      Component Value Range   Ammonia 73 (*) 11 - 60 (umol/L)    Studies/Results: No results found. Medications:  Allergies  Allergen Reactions  . Morphine And Related Hives and Rash    Pt states he is able to tolerate Dilaudid with no reactions.    Current Facility-Administered Medications  Medication Dose Route Frequency Provider Last Rate Last Dose  . acetaminophen (TYLENOL) tablet 650 mg  650 mg Oral Q4H PRN Keitha Butte, NP   650 mg at 06/04/11 1191  .  deferoxamine (DESFERAL) 2,000 mg in dextrose 5 % 500 mL infusion  2,000 mg Intravenous QHS Keitha Butte, NP 125 mL/hr at 06/05/11 2217 2,000 mg at 06/05/11 2217  . deferoxamine (DESFERAL) 2,000 mg in dextrose 5 % 500 mL infusion  2,000 mg Intravenous QHS Keitha Butte, NP      . dextrose 5 % and 0.45 % NaCl with KCl 20 mEq/L infusion   Intravenous Continuous Keitha Butte, NP 50 mL/hr at 06/05/11 1845    . diclofenac sodium (VOLTAREN) 1 % transdermal gel   Topical QID Keitha Butte, NP      . diphenhydrAMINE (BENADRYL) capsule 25-50 mg  25-50 mg Oral Q4H PRN Gwenyth Bender, MD       Or  . diphenhydrAMINE (BENADRYL) injection 12.5-25 mg  12.5-25 mg Intravenous Q4H PRN Gwenyth Bender, MD   25 mg at 06/06/11 1244  . diphenhydrAMINE (BENADRYL) injection 25 mg  25 mg Intravenous Once Keitha Butte, NP      . enoxaparin (LOVENOX) injection 40 mg  40 mg Subcutaneous Q24H Keitha Butte, NP   40 mg at 06/05/11 2217  . folic acid (FOLVITE) tablet 1 mg  1 mg Oral Daily Gwenyth Bender, MD   1 mg at 06/06/11 1050  . haloperidol (HALDOL) tablet 0.5 mg  0.5 mg Oral TID Keitha Butte, NP      . HYDROmorphone (DILAUDID) injection 2-4 mg  2-4 mg Intravenous Q2H Keitha Butte, NP   2 mg at 06/06/11 1244  . hydroxyurea (HYDREA) capsule 500 mg  500 mg Oral BID Keitha Butte, NP   500 mg at 06/06/11 1052  . levalbuterol (XOPENEX) nebulizer solution 0.63 mg  0.63 mg Nebulization Q4H PRN Gwenyth Bender, MD      . lidocaine (LIDODERM) 5 % 3 patch  3 patch Transdermal Q24H Keitha Butte, NP   3 patch at 06/06/11 1051  . moxifloxacin (AVELOX) tablet 400 mg  400 mg Oral q1800 Keitha Butte, NP      . OLANZapine (ZYPREXA) tablet 10 mg  10 mg Oral QHS Keitha Butte, NP   10 mg at 06/05/11 2217  . ondansetron (ZOFRAN) injection 4 mg  4 mg Intravenous Q4H Keitha Butte, NP   4 mg at 06/06/11 1050  . ondansetron (ZOFRAN) injection 4 mg  4 mg Intravenous Q4H PRN  Keitha Butte, NP      . oxyCODONE (OXYCONTIN) 12 hr tablet 80 mg  80 mg Oral Q12H Keitha Butte, NP   80 mg at 06/06/11  1049  . polyethylene glycol (MIRALAX / GLYCOLAX) packet 17 g  17 g Oral Daily Keitha Butte, NP   17 g at 06/06/11 1050  . sodium chloride 0.9 % injection 10-40 mL  10-40 mL Intracatheter PRN Gwenyth Bender, MD   10 mL at 06/03/11 2045  . sodium chloride 0.9 % injection 3 mL  3 mL Intravenous Q12H Keitha Butte, NP   3 mL at 06/02/11 0850  . DISCONTD: aztreonam (AZACTAM) 1 g in dextrose 5 % 50 mL IVPB  1 g Intravenous Q8H Gwenyth Bender, MD   1 g at 06/06/11 1238  . DISCONTD: haloperidol (HALDOL) tablet 5 mg  5 mg Oral TID Keitha Butte, NP      . DISCONTD: moxifloxacin (AVELOX) IVPB 400 mg  400 mg Intravenous Q24H Keitha Butte, NP   400 mg at 06/05/11 1846   Assessment/Plan: Patient Active Problem List  Diagnoses  . Sickle cell pain crisis  . Leukocytosis  . Chronic pain  . Hypokalemia  . Metabolic acidosis  . Thrombocytosis  . Hemochromatosis  . Hypomagnesemia  . Acute chest syndrome due to sickle cell crisis  . Diarrhea  . Elevated brain natriuretic peptide (BNP) level  . Bronchitis  . Sickle cell anemia  . Acute embolism and thrombosis of right internal jugular vein  . Avascular necrosis  . Metabolic encephalopathy   Sickle Cell Crisis/Chronic Pain:  The patient is in active hemolysis but greatly improved. The patient will continue pain/nausea/pruritis/bowel management, IV hydration, home medications,  DVT prophylaxis. CMP and CBC in the am.   Acute chest syndrome due to sickle cell crisis: The patient is receiving antibiotic therapy with Avelox, nebulizer treatments are available PRN,  IS usage is encouraged.  WBC's are trending downward. Hypokalemia:  WNL - The patient will continue receiving IVFs with 20 mEq KCL - will continue to monitor Elevated Ammonia Level:  Ammonia level is still elevated but improved - will continue to  monitor Hemochromatosis:  The patient is receiving nightly Desferal infusions - will continue to monitor Elevated BNP:  The patient's IVFs have been decreased to 50 ml/hr - will recheck ProBNP in the am  Discussed and agreed upon plan of care with the patient.  Plan of care will be adjusted based on the patient's clinical progress.   Larina Bras, NP-C 06/06/2011, 1:44 PM

## 2011-06-07 DIAGNOSIS — D5700 Hb-SS disease with crisis, unspecified: Secondary | ICD-10-CM | POA: Diagnosis not present

## 2011-06-07 LAB — CBC
HCT: 28.1 % — ABNORMAL LOW (ref 39.0–52.0)
MCH: 29.7 pg (ref 26.0–34.0)
MCHC: 33.5 g/dL (ref 30.0–36.0)
MCV: 88.6 fL (ref 78.0–100.0)
Platelets: 418 10*3/uL — ABNORMAL HIGH (ref 150–400)
RDW: 17.4 % — ABNORMAL HIGH (ref 11.5–15.5)
WBC: 10.2 10*3/uL (ref 4.0–10.5)

## 2011-06-07 LAB — COMPREHENSIVE METABOLIC PANEL
AST: 51 U/L — ABNORMAL HIGH (ref 0–37)
Albumin: 3.8 g/dL (ref 3.5–5.2)
BUN: 8 mg/dL (ref 6–23)
Calcium: 8.9 mg/dL (ref 8.4–10.5)
Chloride: 103 mEq/L (ref 96–112)
Creatinine, Ser: 0.53 mg/dL (ref 0.50–1.35)
Total Bilirubin: 3.1 mg/dL — ABNORMAL HIGH (ref 0.3–1.2)
Total Protein: 7.3 g/dL (ref 6.0–8.3)

## 2011-06-07 LAB — PRO B NATRIURETIC PEPTIDE: Pro B Natriuretic peptide (BNP): 7.4 pg/mL (ref 0–125)

## 2011-06-07 MED ORDER — POTASSIUM CHLORIDE CRYS ER 20 MEQ PO TBCR
40.0000 meq | EXTENDED_RELEASE_TABLET | Freq: Once | ORAL | Status: AC
  Administered 2011-06-07: 40 meq via ORAL
  Filled 2011-06-07: qty 2

## 2011-06-07 NOTE — Progress Notes (Signed)
Subjective: The patient was seen on rounds today.  The patient was sitting in his bed, watching television.  The patient is rating his pain at a 4-5/10 in his bilateral flank areas and bilateral LE pain (thigh areas). The patient stated that he might be ready to manage his pain at home by tomorrow.  No other nursing or patient concerns.  Objective: Vital signs in last 24 hours: Blood pressure 114/74, pulse 76, temperature 98.6 F (37 C), temperature source Oral, resp. rate 18, height 6' (1.829 m), weight 166 lb 7.2 oz (75.5 kg), SpO2 98.00%.  General Appearance: Alert and oriented, cooperative, well developed, well nourished, no apparent distress Head: Normocephalic, without obvious abnormality, atraumatic Eyes: PERRLA, EOMI, the patient has slight strabismus, scleral icterus Nose: Nares normal, septum midline, mucosa normal, no drainage or sinus tenderness. Throat:  Normal lips, mucosa, tongue and gums Back: Symmetric, bilateral CVA/flank tenderness, no back tenderness Resp: CTA, Diminished bibasilar breath sounds, no wheezes/rales/rhonchi Cardio: S1, S2 regular, no murmurs/clicks/rubs/gallops GI: Soft, non-tender, normoactive bowel sounds, no masses, no organomegaly Male Genitalia: Deferred, the patient denies priapism Extremities: Extremities normal, atraumatic, no cyanosis or edema, negative Homans sign, no sign of DVT, bilateral thigh tenderness Pulses: 2+ and symmetric Skin: Skin color, texture, turgor normal, no rashes or lesions, numerous tattoos Neurologic: Grossly normal, CN II-XII intact, no focal deficits Psych:  Appropriate affect  Lab Results: Results for orders placed during the hospital encounter of 05/30/11 (from the past 24 hour(s))  CBC     Status: Abnormal   Collection Time   06/07/11  6:50 AM      Component Value Range   WBC 10.2  4.0 - 10.5 (K/uL)   RBC 3.17 (*) 4.22 - 5.81 (MIL/uL)   Hemoglobin 9.4 (*) 13.0 - 17.0 (g/dL)   HCT 16.1 (*) 09.6 - 52.0 (%)   MCV 88.6   78.0 - 100.0 (fL)   MCH 29.7  26.0 - 34.0 (pg)   MCHC 33.5  30.0 - 36.0 (g/dL)   RDW 04.5 (*) 40.9 - 15.5 (%)   Platelets 418 (*) 150 - 400 (K/uL)  COMPREHENSIVE METABOLIC PANEL     Status: Abnormal   Collection Time   06/07/11  6:50 AM      Component Value Range   Sodium 135  135 - 145 (mEq/L)   Potassium 3.5  3.5 - 5.1 (mEq/L)   Chloride 103  96 - 112 (mEq/L)   CO2 22  19 - 32 (mEq/L)   Glucose, Bld 111 (*) 70 - 99 (mg/dL)   BUN 8  6 - 23 (mg/dL)   Creatinine, Ser 8.11  0.50 - 1.35 (mg/dL)   Calcium 8.9  8.4 - 91.4 (mg/dL)   Total Protein 7.3  6.0 - 8.3 (g/dL)   Albumin 3.8  3.5 - 5.2 (g/dL)   AST 51 (*) 0 - 37 (U/L)   ALT 56 (*) 0 - 53 (U/L)   Alkaline Phosphatase 111  39 - 117 (U/L)   Total Bilirubin 3.1 (*) 0.3 - 1.2 (mg/dL)   GFR calc non Af Amer >90  >90 (mL/min)   GFR calc Af Amer >90  >90 (mL/min)  PRO B NATRIURETIC PEPTIDE     Status: Normal   Collection Time   06/07/11  6:50 AM      Component Value Range   Pro B Natriuretic peptide (BNP) 7.4  0 - 125 (pg/mL)    Studies/Results: No results found. Medications:  Allergies  Allergen Reactions  . Morphine And  Related Hives and Rash    Pt states he is able to tolerate Dilaudid with no reactions.    Current Facility-Administered Medications  Medication Dose Route Frequency Provider Last Rate Last Dose  . acetaminophen (TYLENOL) tablet 650 mg  650 mg Oral Q4H PRN Keitha Butte, NP   650 mg at 06/04/11 4098  . deferoxamine (DESFERAL) 2,000 mg in dextrose 5 % 500 mL infusion  2,000 mg Intravenous QHS Keitha Butte, NP 125 mL/hr at 06/06/11 2148 2,000 mg at 06/06/11 2148  . dextrose 5 % and 0.45 % NaCl with KCl 20 mEq/L infusion   Intravenous Continuous Keitha Butte, NP 50 mL/hr at 06/05/11 1845    . diclofenac sodium (VOLTAREN) 1 % transdermal gel   Topical QID Keitha Butte, NP      . diphenhydrAMINE (BENADRYL) capsule 25-50 mg  25-50 mg Oral Q4H PRN Gwenyth Bender, MD       Or  . diphenhydrAMINE  (BENADRYL) injection 12.5-25 mg  12.5-25 mg Intravenous Q4H PRN Gwenyth Bender, MD   25 mg at 06/07/11 1300  . diphenhydrAMINE (BENADRYL) injection 25 mg  25 mg Intravenous Once Keitha Butte, NP      . enoxaparin (LOVENOX) injection 40 mg  40 mg Subcutaneous Q24H Keitha Butte, NP   40 mg at 06/06/11 2147  . folic acid (FOLVITE) tablet 1 mg  1 mg Oral Daily Gwenyth Bender, MD   1 mg at 06/07/11 1115  . haloperidol (HALDOL) tablet 0.5 mg  0.5 mg Oral TID Keitha Butte, NP   0.5 mg at 06/07/11 1116  . HYDROmorphone (DILAUDID) injection 2-4 mg  2-4 mg Intravenous Q2H Keitha Butte, NP   4 mg at 06/07/11 1300  . hydroxyurea (HYDREA) capsule 500 mg  500 mg Oral BID Keitha Butte, NP   500 mg at 06/07/11 1116  . levalbuterol (XOPENEX) nebulizer solution 0.63 mg  0.63 mg Nebulization Q4H PRN Gwenyth Bender, MD      . lidocaine (LIDODERM) 5 % 3 patch  3 patch Transdermal Q24H Keitha Butte, NP   3 patch at 06/06/11 1051  . moxifloxacin (AVELOX) tablet 400 mg  400 mg Oral q1800 Keitha Butte, NP   400 mg at 06/06/11 1735  . OLANZapine (ZYPREXA) tablet 10 mg  10 mg Oral QHS Keitha Butte, NP   10 mg at 06/06/11 2148  . ondansetron (ZOFRAN) injection 4 mg  4 mg Intravenous Q4H Keitha Butte, NP   4 mg at 06/07/11 1300  . ondansetron (ZOFRAN) injection 4 mg  4 mg Intravenous Q4H PRN Keitha Butte, NP   4 mg at 06/07/11 0901  . oxyCODONE (OXYCONTIN) 12 hr tablet 80 mg  80 mg Oral Q12H Keitha Butte, NP   80 mg at 06/07/11 1115  . polyethylene glycol (MIRALAX / GLYCOLAX) packet 17 g  17 g Oral Daily Keitha Butte, NP   17 g at 06/06/11 1050  . potassium chloride SA (K-DUR,KLOR-CON) CR tablet 40 mEq  40 mEq Oral Once Keitha Butte, NP      . sodium chloride 0.9 % injection 10-40 mL  10-40 mL Intracatheter PRN Gwenyth Bender, MD   10 mL at 06/07/11 1221  . sodium chloride 0.9 % injection 3 mL  3 mL Intravenous Q12H Keitha Butte, NP   3 mL at  06/02/11 0850   Assessment/Plan: Patient Active Problem List  Diagnoses  .  Sickle cell pain crisis  . Leukocytosis  . Chronic pain  . Hypokalemia  . Metabolic acidosis  . Thrombocytosis  . Hemochromatosis  . Hypomagnesemia  . Acute chest syndrome due to sickle cell crisis  . Diarrhea  . Elevated brain natriuretic peptide (BNP) level  . Bronchitis  . Sickle cell anemia  . Acute embolism and thrombosis of right internal jugular vein  . Avascular necrosis  . Metabolic encephalopathy   Sickle Cell Crisis/Chronic Pain:  The patient is in active hemolysis but continues to trend downward. The patient will continue pain/nausea/pruritis/bowel management, IV hydration, home medications,  DVT prophylaxis. CMP and CBC in the am.   Acute chest syndrome due to sickle cell crisis: The patient is receiving antibiotic therapy with Avelox, nebulizer treatments are available PRN,  IS usage is encouraged.   Hypokalemia:  The patient's KCL level is on the lower side of normal - will give PO supplementation today and  continue IVFs with 20 mEq KCL  Elevated Ammonia Level:  Will recheck ammonia level in the am Hemochromatosis:  The patient is receiving nightly Desferal infusions - will continue to monitor Elevated BNP:  WNL - will continue to monitor  Discussed and agreed upon plan of care with the patient.  Plan of care will be adjusted based on the patient's clinical progress.   Larina Bras, NP-C 06/07/2011, 2:08 PM

## 2011-06-08 DIAGNOSIS — D5700 Hb-SS disease with crisis, unspecified: Secondary | ICD-10-CM | POA: Diagnosis not present

## 2011-06-08 LAB — COMPREHENSIVE METABOLIC PANEL
ALT: 68 U/L — ABNORMAL HIGH (ref 0–53)
AST: 61 U/L — ABNORMAL HIGH (ref 0–37)
Albumin: 4 g/dL (ref 3.5–5.2)
Alkaline Phosphatase: 123 U/L — ABNORMAL HIGH (ref 39–117)
BUN: 8 mg/dL (ref 6–23)
CO2: 22 mEq/L (ref 19–32)
Calcium: 9 mg/dL (ref 8.4–10.5)
Chloride: 104 mEq/L (ref 96–112)
Creatinine, Ser: 0.57 mg/dL (ref 0.50–1.35)
GFR calc Af Amer: >90 mL/min (ref >90)
GFR calc non Af Amer: >90 mL/min (ref >90)
Glucose, Bld: 127 mg/dL — ABNORMAL HIGH (ref 70–99)
Potassium: 3.7 mEq/L (ref 3.5–5.1)
Sodium: 135 mEq/L (ref 135–145)
Total Bilirubin: 3.3 mg/dL — ABNORMAL HIGH (ref 0.3–1.2)
Total Protein: 7.4 g/dL (ref 6.0–8.3)

## 2011-06-08 LAB — CBC
HCT: 26.7 % — ABNORMAL LOW (ref 39.0–52.0)
Hemoglobin: 9 g/dL — ABNORMAL LOW (ref 13.0–17.0)
MCV: 89 fL (ref 78.0–100.0)
RBC: 3 MIL/uL — ABNORMAL LOW (ref 4.22–5.81)
RDW: 17.3 % — ABNORMAL HIGH (ref 11.5–15.5)
WBC: 11.8 10*3/uL — ABNORMAL HIGH (ref 4.0–10.5)

## 2011-06-08 LAB — AMMONIA: Ammonia: 59 umol/L (ref 11–60)

## 2011-06-08 MED ORDER — HYDROMORPHONE HCL 4 MG PO TABS: 4.0000 mg | ORAL_TABLET | ORAL | Status: DC | PRN

## 2011-06-08 MED ORDER — OXYCODONE HCL 80 MG PO TB12: 80.0000 mg | ORAL_TABLET | Freq: Two times a day (BID) | ORAL | Status: DC

## 2011-06-08 MED ORDER — IBUPROFEN 800 MG PO TABS: 800.0000 mg | ORAL_TABLET | Freq: Three times a day (TID) | ORAL | Status: DC | PRN

## 2011-06-08 MED ORDER — OLANZAPINE 10 MG PO TABS: 10.0000 mg | ORAL_TABLET | Freq: Every day | ORAL | Status: DC

## 2011-06-21 IMAGING — CR DG CHEST 2V
2 series · 2 of 2 positions shown · non-contrast
Comparison: 06/21/2008 and CT chest 04/01/2008

CLINICAL DATA: Sickle cell crisis with chest pain.

CHEST - 2 VIEW

[w chest pa]
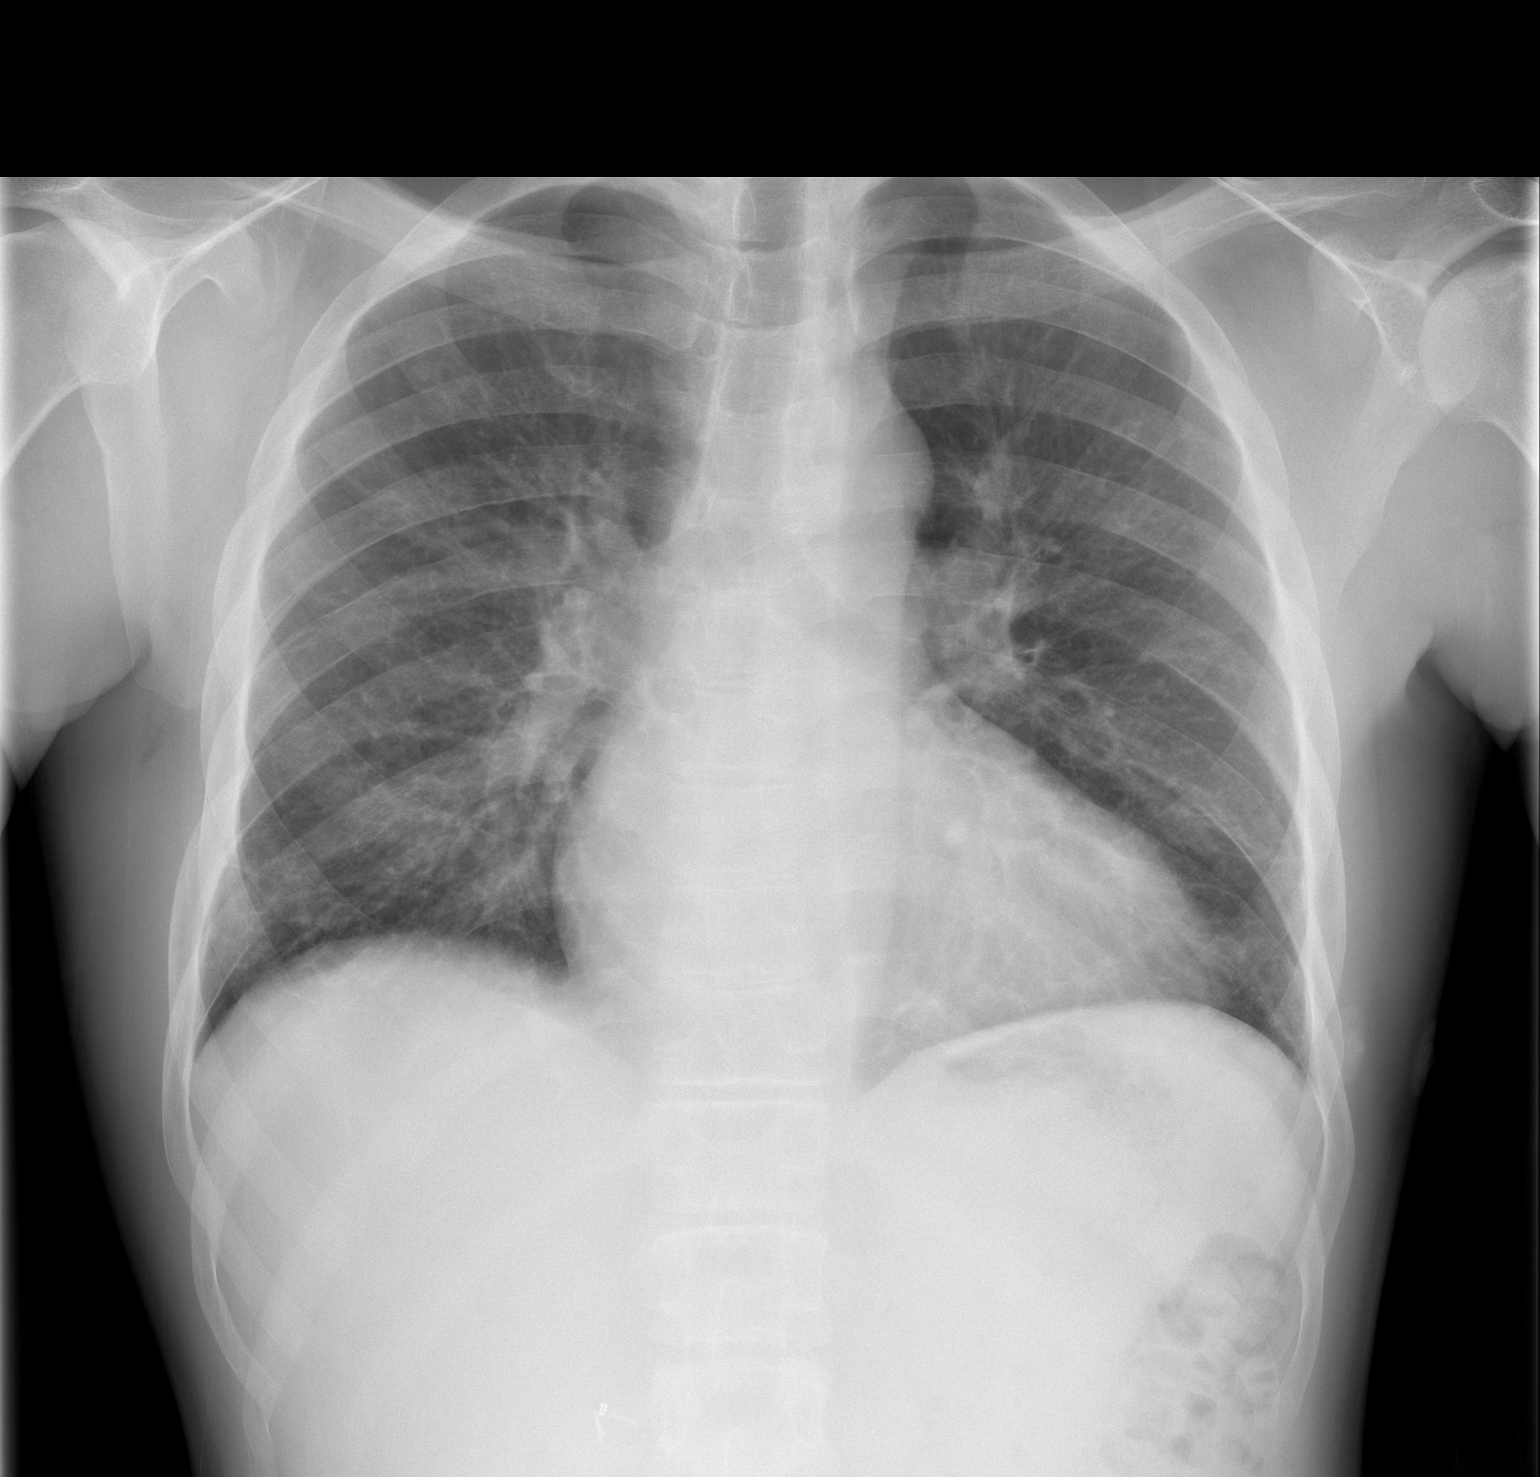

[w chest lat]
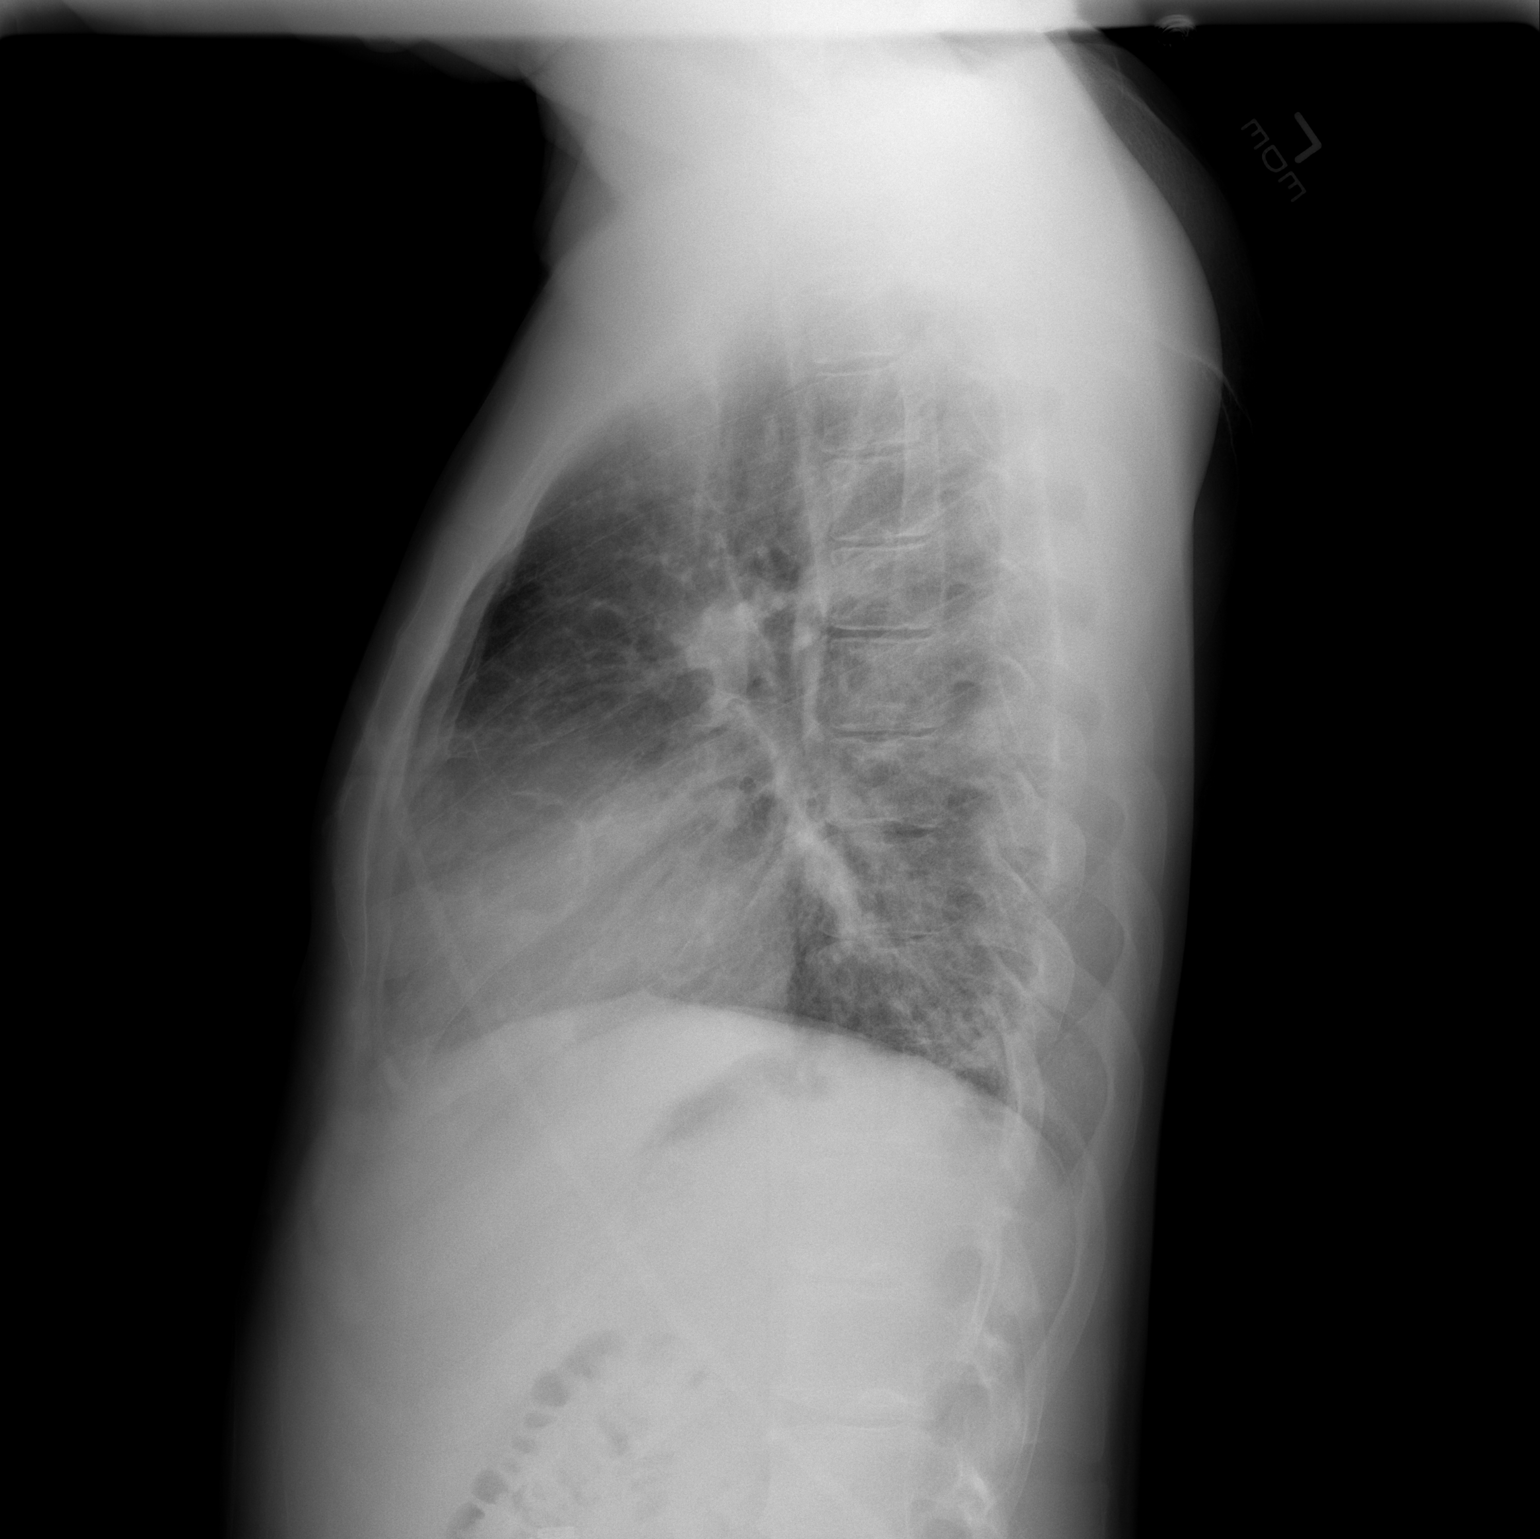

[2 of 2 positions shown; findings below may reference images not displayed]

FINDINGS: Trachea is midline.  Heart is enlarged.  There is mild
scattered interstitial thickening.  No pleural fluid.
IMPRESSION: Mild scattered interstitial thickening.  Early acute chest syndrome
is not excluded.

## 2011-06-22 ENCOUNTER — Encounter (HOSPITAL_COMMUNITY): Payer: Self-pay | Admitting: Hematology

## 2011-06-22 ENCOUNTER — Telehealth (HOSPITAL_COMMUNITY): Payer: Self-pay | Admitting: General Practice

## 2011-06-22 ENCOUNTER — Non-Acute Institutional Stay (HOSPITAL_COMMUNITY): Payer: Medicare Other

## 2011-06-22 ENCOUNTER — Non-Acute Institutional Stay (HOSPITAL_COMMUNITY)
Admission: AD | Admit: 2011-06-22 | Discharge: 2011-06-23 | Disposition: A | Discharge status: Home / Self Care | Payer: Medicare Other | Attending: Internal Medicine | Admitting: Internal Medicine

## 2011-06-22 DIAGNOSIS — R918 Other nonspecific abnormal finding of lung field: Secondary | ICD-10-CM | POA: Diagnosis not present

## 2011-06-22 DIAGNOSIS — R079 Chest pain, unspecified: Secondary | ICD-10-CM | POA: Diagnosis not present

## 2011-06-22 DIAGNOSIS — Z96649 Presence of unspecified artificial hip joint: Secondary | ICD-10-CM | POA: Insufficient documentation

## 2011-06-22 DIAGNOSIS — Z79899 Other long term (current) drug therapy: Secondary | ICD-10-CM | POA: Diagnosis not present

## 2011-06-22 DIAGNOSIS — D5700 Hb-SS disease with crisis, unspecified: Secondary | ICD-10-CM

## 2011-06-22 DIAGNOSIS — D72829 Elevated white blood cell count, unspecified: Secondary | ICD-10-CM | POA: Diagnosis not present

## 2011-06-22 DIAGNOSIS — R0789 Other chest pain: Secondary | ICD-10-CM | POA: Insufficient documentation

## 2011-06-22 DIAGNOSIS — Z86718 Personal history of other venous thrombosis and embolism: Secondary | ICD-10-CM | POA: Insufficient documentation

## 2011-06-22 DIAGNOSIS — D5701 Hb-SS disease with acute chest syndrome: Secondary | ICD-10-CM

## 2011-06-22 DIAGNOSIS — D571 Sickle-cell disease without crisis: Secondary | ICD-10-CM | POA: Diagnosis not present

## 2011-06-22 LAB — DIFFERENTIAL
Basophils Relative: 1 % (ref 0–1)
Eosinophils Relative: 2 % (ref 0–5)
Lymphocytes Relative: 10 % — ABNORMAL LOW (ref 12–46)
Monocytes Relative: 7 % (ref 3–12)
Neutrophils Relative %: 80 % — ABNORMAL HIGH (ref 43–77)

## 2011-06-22 LAB — COMPREHENSIVE METABOLIC PANEL
CO2: 23 mEq/L (ref 19–32)
Calcium: 9 mg/dL (ref 8.4–10.5)
Creatinine, Ser: 0.48 mg/dL — ABNORMAL LOW (ref 0.50–1.35)
GFR calc Af Amer: >90 mL/min (ref >90)
GFR calc non Af Amer: >90 mL/min (ref >90)
Glucose, Bld: 107 mg/dL — ABNORMAL HIGH (ref 70–99)
Total Protein: 7.8 g/dL (ref 6.0–8.3)

## 2011-06-22 LAB — CBC
HCT: 28.5 % — ABNORMAL LOW (ref 39.0–52.0)
Hemoglobin: 9.5 g/dL — ABNORMAL LOW (ref 13.0–17.0)
RBC: 3.15 MIL/uL — ABNORMAL LOW (ref 4.22–5.81)

## 2011-06-22 MED ORDER — DEXTROSE 5 % IV SOLN
500.0000 mg | Freq: Every day | INTRAVENOUS | Status: DC
  Administered 2011-06-22: 500 mg via INTRAVENOUS
  Filled 2011-06-22: qty 500

## 2011-06-22 MED ORDER — HYDROMORPHONE HCL PF 2 MG/ML IJ SOLN
2.0000 mg | INTRAMUSCULAR | Status: DC | PRN
  Administered 2011-06-22 – 2011-06-23 (×10): 4 mg via INTRAVENOUS
  Filled 2011-06-22 (×9): qty 2

## 2011-06-22 MED ORDER — HYDROMORPHONE HCL PF 2 MG/ML IJ SOLN
4.0000 mg | Freq: Once | INTRAMUSCULAR | Status: DC
  Filled 2011-06-22: qty 2

## 2011-06-22 MED ORDER — FOLIC ACID 1 MG PO TABS
1.0000 mg | ORAL_TABLET | Freq: Every day | ORAL | Status: DC
  Administered 2011-06-23: 1 mg via ORAL
  Filled 2011-06-22: qty 1

## 2011-06-22 MED ORDER — DIPHENHYDRAMINE HCL 25 MG PO CAPS: 25.0000 mg | ORAL_CAPSULE | ORAL | Status: DC | PRN

## 2011-06-22 MED ORDER — HYDROMORPHONE HCL PF 4 MG/ML IJ SOLN
INTRAMUSCULAR | Status: AC
  Administered 2011-06-22: 4 mg via INTRAMUSCULAR
  Filled 2011-06-22: qty 1

## 2011-06-22 MED ORDER — OLANZAPINE 10 MG PO TABS
10.0000 mg | ORAL_TABLET | Freq: Every day | ORAL | Status: DC
  Filled 2011-06-22: qty 1

## 2011-06-22 MED ORDER — IBUPROFEN 800 MG PO TABS
800.0000 mg | ORAL_TABLET | Freq: Three times a day (TID) | ORAL | Status: DC
  Administered 2011-06-22 – 2011-06-23 (×2): 800 mg via ORAL
  Filled 2011-06-22 (×2): qty 1

## 2011-06-22 MED ORDER — ONDANSETRON HCL 4 MG/2ML IJ SOLN
4.0000 mg | INTRAMUSCULAR | Status: DC | PRN
  Administered 2011-06-22 – 2011-06-23 (×5): 4 mg via INTRAVENOUS
  Filled 2011-06-22 (×5): qty 2

## 2011-06-22 MED ORDER — ONDANSETRON HCL 4 MG PO TABS: 4.0000 mg | ORAL_TABLET | ORAL | Status: DC | PRN

## 2011-06-22 MED ORDER — DIPHENHYDRAMINE HCL 50 MG/ML IJ SOLN
12.5000 mg | INTRAMUSCULAR | Status: DC | PRN
  Administered 2011-06-22 – 2011-06-23 (×5): 25 mg via INTRAVENOUS
  Filled 2011-06-22 (×5): qty 1

## 2011-06-22 MED ORDER — DEXTROSE-NACL 5-0.45 % IV SOLN
INTRAVENOUS | Status: DC
  Administered 2011-06-22: 16:00:00 via INTRAVENOUS

## 2011-06-22 MED ORDER — OXYCODONE HCL 80 MG PO TB12
80.0000 mg | ORAL_TABLET | Freq: Two times a day (BID) | ORAL | Status: DC
  Administered 2011-06-22 – 2011-06-23 (×2): 80 mg via ORAL
  Filled 2011-06-22 (×2): qty 1

## 2011-06-22 NOTE — H&P (Signed)
Patient ID: [name], male   DOB: 05/25/1980, 31 y.o.   MRN: [id number]  Chief Complaint  Patient presents with  . Sickle Cell Pain Crisis    HPI [name] is a 31 y.o. male.  Patient has hemoglobin SS disease who was doing well until 2 days ago. He began noting an increasing aching pain in his chest. There was no associated shortness of breath and diaphoresis. He did experience occasional nonproductive cough. Patient denied headaches. He noted transient night sweats as well. This morning his pain was much more severe. He did increase his pain medication usage during the night without significant relief. He called the unit for further treatment and comes in for further evaluation and therapy.   Past Medical History  Diagnosis Date  . Sickle cell anemia   . Blood transfusion   . 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 Hypokalemia   . Mood disorder   . Pulmonary embolism   . Avascular necrosis   . Leukocytosis     Chronic  . Thrombocytosis     Chronic    Past Surgical History  Procedure Date  . Right hip replacement     08/2006  . Cholecystectomy     01/2008  . Porta cath placement   . Porta cath removal   . Umbilical hernia repair     01/2008  . Excision of left periauricular cyst     10/2009  . Excision of right ear lobe cyst with primary closur     11/2007    Family History  Problem Relation Age of Onset  . Sickle cell anemia Mother   . Sickle cell anemia Father   . Sickle cell trait Brother     Social History History  Substance Use Topics  . Smoking status: Former Smoker -- 13 years    Types: Cigarettes    Quit date: 07/08/2010  . Smokeless tobacco: Never Used  . Alcohol Use: No    Allergies  Allergen Reactions  . Morphine And Related Hives and Rash    Pt states he is able to tolerate Dilaudid with no reactions.    Current Facility-Administered Medications  Medication Dose Route Frequency Provider Last Rate Last  Dose  . dextrose 5 %-0.45 % sodium chloride infusion   Intravenous Continuous Gwenyth Bender, MD 125 mL/hr at 06/22/11 1614    . diphenhydrAMINE (BENADRYL) capsule 25-50 mg  25-50 mg Oral Q4H PRN Gwenyth Bender, MD       Or  . diphenhydrAMINE (BENADRYL) injection 12.5-25 mg  12.5-25 mg Intravenous Q4H PRN Gwenyth Bender, MD   25 mg at 06/22/11 1936  . folic acid (FOLVITE) tablet 1 mg  1 mg Oral Daily Gwenyth Bender, MD      . HYDROmorphone (DILAUDID) 4 MG/ML injection        4 mg at 06/22/11 1545  . HYDROmorphone (DILAUDID) injection 2-4 mg  2-4 mg Intravenous Q2H PRN Gwenyth Bender, MD   4 mg at 06/22/11 2130  . HYDROmorphone (DILAUDID) injection 4 mg  4 mg Intramuscular Once Gwenyth Bender, MD      . ondansetron [facility]) tablet 4 mg  4 mg Oral Q4H PRN Gwenyth Bender, MD       Or  . ondansetron [facility]) injection 4 mg  4 mg Intravenous Q4H PRN Gwenyth Bender, MD   4 mg at 06/22/11 1946    Review of Systems As noted  above. Denies any new stressors. Patient presently not working.  Blood pressure 112/65, pulse 74, temperature 98.7 F (37.1 C), temperature source Oral, resp. rate 18, height 6' (1.829 m), weight 165 lb (74.844 kg), SpO2 100.00%.  Physical Exam Well-developed well-nourished black male in no moderate distress. HEENT: No sinus tenderness. No sclera icterus. Posterior pharynx is clear. NECK: No enlarged thyroid. No posterior cervical nodes. LUNGS: Clear to auscultation. No vocal fremitus. CV: Normal S1, S2 without S3. Anterior chest wall tenderness to palpation. No crepitation or increased warmth. ABDOMEN: Bowel sounds are present. No epigastric tenderness. No masses appreciated. MSK: Minimal tenderness lower lumbar sacral spine. No a.c. Joint tenderness. Negative Homans. NEURO: Intact.  Data Reviewed  Results for orders placed during the hospital encounter of 06/22/11 (from the past 48 hour(s))  COMPREHENSIVE METABOLIC PANEL     Status: Abnormal   Collection Time   06/22/11  4:27 PM       Component Value Range Comment   Sodium 136  135 - 145 (mEq/L)    Potassium 3.7  3.5 - 5.1 (mEq/L)    Chloride 104  96 - 112 (mEq/L)    CO2 23  19 - 32 (mEq/L)    Glucose, Bld 107 (*) 70 - 99 (mg/dL)    BUN 4 (*) 6 - 23 (mg/dL)    Creatinine, Ser 1.61 (*) 0.50 - 1.35 (mg/dL)    Calcium 9.0  8.4 - 10.5 (mg/dL)    Total Protein 7.8  6.0 - 8.3 (g/dL)    Albumin 4.2  3.5 - 5.2 (g/dL)    AST 26  0 - 37 (U/L)    ALT 21  0 - 53 (U/L)    Alkaline Phosphatase 120 (*) 39 - 117 (U/L)    Total Bilirubin 2.4 (*) 0.3 - 1.2 (mg/dL)    GFR calc non Af Amer >90  >90 (mL/min)    GFR calc Af Amer >90  >90 (mL/min)   CBC     Status: Abnormal   Collection Time   06/22/11  4:27 PM      Component Value Range Comment   WBC 19.2 (*) 4.0 - 10.5 (K/uL)    RBC 3.15 (*) 4.22 - 5.81 (MIL/uL)    Hemoglobin 9.5 (*) 13.0 - 17.0 (g/dL)    HCT 09.6 (*) 04.5 - 52.0 (%)    MCV 90.5  78.0 - 100.0 (fL)    MCH 30.2  26.0 - 34.0 (pg)    MCHC 33.3  30.0 - 36.0 (g/dL)    RDW 40.9 (*) 81.1 - 15.5 (%)    Platelets 604 (*) 150 - 400 (K/uL)   DIFFERENTIAL     Status: Abnormal   Collection Time   06/22/11  4:27 PM      Component Value Range Comment   Neutrophils Relative 80 (*) 43 - 77 (%)    Lymphocytes Relative 10 (*) 12 - 46 (%)    Monocytes Relative 7  3 - 12 (%)    Eosinophils Relative 2  0 - 5 (%)    Basophils Relative 1  0 - 1 (%)    Neutro Abs 15.4 (*) 1.7 - 7.7 (K/uL)    Lymphs Abs 1.9  0.7 - 4.0 (K/uL)    Monocytes Absolute 1.3 (*) 0.1 - 1.0 (K/uL)    Eosinophils Absolute 0.4  0.0 - 0.7 (K/uL)    Basophils Absolute 0.2 (*) 0.0 - 0.1 (K/uL)    WBC Morphology VACUOLATED NEUTROPHILS      Smear  Review LARGE PLATELETS PRESENT        Assessment    Acute sickle cell crisis. Patient presently with active hemolysis. Leukocytosis secondary to #1. Rule out occult infection. Noncardiac chest pain. History of sickle cell lung disease. History of acute chest syndrome.    Plan    We'll begin treatment with IV  fluids, IV Dilaudid. We'll place on. Antibiotics at this time as well. Followup chest x-ray in a.m. Continue home medications. Incentive inspirometer.       Skylan Lara 06/22/2011, 11:13 PM

## 2011-06-23 LAB — URINALYSIS, ROUTINE W REFLEX MICROSCOPIC
Bilirubin Urine: NEGATIVE
Glucose, UA: NEGATIVE mg/dL
Hgb urine dipstick: NEGATIVE
Ketones, ur: NEGATIVE mg/dL
Leukocytes, UA: NEGATIVE
Nitrite: NEGATIVE
Protein, ur: NEGATIVE mg/dL
Specific Gravity, Urine: 1.015 (ref 1.005–1.030)
Urobilinogen, UA: 1 mg/dL (ref 0.0–1.0)
pH: 7 (ref 5.0–8.0)

## 2011-06-23 MED ORDER — AZITHROMYCIN 500 MG PO TABS
500.0000 mg | ORAL_TABLET | Freq: Every day | ORAL | Status: DC
  Administered 2011-06-23: 500 mg via ORAL
  Filled 2011-06-23: qty 1

## 2011-06-23 MED ORDER — LEVALBUTEROL HCL 0.63 MG/3ML IN NEBU: 1.0000 | INHALATION_SOLUTION | RESPIRATORY_TRACT | Status: DC | PRN

## 2011-06-23 MED ORDER — ZOLPIDEM TARTRATE 5 MG PO TABS
10.0000 mg | ORAL_TABLET | Freq: Every evening | ORAL | Status: DC | PRN
  Administered 2011-06-23: 10 mg via ORAL
  Filled 2011-06-23: qty 2

## 2011-06-23 MED ORDER — HYDROMORPHONE HCL 4 MG PO TABS: 4.0000 mg | ORAL_TABLET | ORAL | Status: DC | PRN

## 2011-06-23 MED ORDER — DEXTROSE 5 % IV SOLN: 500.0000 mg | Freq: Every day | INTRAVENOUS | Status: DC

## 2011-06-23 MED ORDER — OLANZAPINE 10 MG PO TABS: 10.0000 mg | ORAL_TABLET | Freq: Every day | ORAL | Status: DC

## 2011-06-23 MED ORDER — AZITHROMYCIN 500 MG PO TABS: 500.0000 mg | ORAL_TABLET | Freq: Every day | ORAL | Status: AC

## 2011-06-23 MED ORDER — ZOLPIDEM TARTRATE 5 MG PO TABS: 5.0000 mg | ORAL_TABLET | Freq: Every evening | ORAL | Status: DC | PRN

## 2011-06-23 MED ORDER — DEXTROSE 5 % IV SOLN
500.0000 mg | INTRAVENOUS | Status: DC
  Filled 2011-06-23: qty 500

## 2011-06-23 MED ORDER — ZOLPIDEM TARTRATE 10 MG PO TABS: 10.0000 mg | ORAL_TABLET | Freq: Every evening | ORAL | Status: DC | PRN

## 2011-06-23 MED ORDER — AZITHROMYCIN 500 MG PO TABS: 500.0000 mg | ORAL_TABLET | Freq: Every day | ORAL | Status: DC

## 2011-06-23 MED ORDER — OXYCODONE HCL 80 MG PO TB12: 80.0000 mg | ORAL_TABLET | Freq: Two times a day (BID) | ORAL | Status: DC

## 2011-06-23 NOTE — Progress Notes (Signed)
Patient ID: [name], male   DOB: 02-Mar-1980, 31 y.o.   MRN: [id number] Pt left at 1150 ambulating by himself, friend was driving him home. Discharge planning done with patient, prescriptions given, will monitor

## 2011-06-23 NOTE — Discharge Summary (Signed)
Physician Discharge Summary  Patient ID: [name] MRN: [id number] DOB/AGE: Jan 07, 1981 31 y.o.  Admit date: 05/30/2011 Discharge date: 06/08/2011   Discharge Diagnoses:   Sickle cell crisis Acute chest syndrome Metabolic encephalopathy Hypokalemia Discharged Condition: improved  Operations/Procedues: exchange transfusions x3   Hospital Course: patient was [facility] the [facility] for further treatment of this chest pain with associated acute chest syndrome and sickle cell crisis. Patient was maintained on IV fluids. He was also placed on IV Avelox empirically for his sickle cell lung disease. He was encouraged to use incentive corroboratory as well. Because of his ongoing pain and associated chest symptoms patient underwent an exchange transfusions x3. Over the subsequent days with intensive pulmonary therapy, antibiotics and appropriate IV fluids, patient made steady improvement. He was noted during his hospital stay to have an elevated ammonia consistent with a hepatic encephalopathy. This was treated with lactulose with good response. He did  Alcohol periodically for aggressive behavior but this improved.  By 06/08/2011 the patient was feeling considerably better. His hemoglobin was stable at 9.0. Patient was ultimately discharged home on oral medications.   Significant Diagnostic Studies:  None.  Disposition: 01-Home or Self Care  Discharge Orders    Future Orders Please Complete By Expires   Diet - low sodium heart healthy      Increase activity slowly      No wound care      Call MD for:  severe uncontrolled pain      Discharge instructions      Comments:   Follow up with primary care physician in one week.     Medication List  As of 06/23/2011 12:01 AM   TAKE these medications         folic acid 1 MG tablet   Commonly known as: FOLVITE   Take 1 tablet (1 mg total) by mouth daily.      HYDROmorphone 4 MG tablet   Commonly known as:  DILAUDID   Take 1 tablet (4 mg total) by mouth every 4 (four) hours as needed. For pain      hydroxyurea 500 MG capsule   Commonly known as: HYDREA   Take 500 mg by mouth 2 (two) times daily. May take with food to minimize GI side effects.      ibuprofen 800 MG tablet   Commonly known as: ADVIL,MOTRIN   Take 1 tablet (800 mg total) by mouth every 8 (eight) hours as needed. For pain.      OLANZapine 10 MG tablet   Commonly known as: ZYPREXA   Take 10 mg by mouth at bedtime as needed. for sleep.      OLANZapine 10 MG tablet   Commonly known as: ZYPREXA   Take 1 tablet (10 mg total) by mouth at bedtime.      oxyCODONE 80 MG 12 hr tablet   Commonly known as: OXYCONTIN   Take 1 tablet (80 mg total) by mouth every 12 (twelve) hours. pain      potassium chloride SA 20 MEQ tablet   Commonly known as: K-DUR,KLOR-CON   Take 1 tablet (20 mEq total) by mouth daily.      zolpidem 10 MG tablet   Commonly known as: AMBIEN   Take 1 tablet (10 mg total) by mouth at bedtime as needed. For sleep.             SignedAugust Saucer, Yun Gutierrez 06/23/2011, 12:01 AM

## 2011-06-23 NOTE — Progress Notes (Signed)
Patient ID: [name], male   DOB: 24-Aug-1980, 31 y.o.   MRN: [id number] Patient admitted for sickle cell pain crisis, with level of 7 (0-10 scale), ongoing. Notified hospitalist due to [facility] 19.2. Tama Gander NP ordered chest xray and  Dr. [name] ordered antibiotic. Patient given treatment as ordered and pain level remains the same. Report given to oncoming RN staff.         WT

## 2011-06-30 ENCOUNTER — Non-Acute Institutional Stay (HOSPITAL_COMMUNITY): Payer: Medicare Other

## 2011-06-30 ENCOUNTER — Inpatient Hospital Stay (HOSPITAL_COMMUNITY)
Admission: AD | Admit: 2011-06-30 | Discharge: 2011-07-11 | DRG: 812 | Disposition: A | Discharge status: Home / Self Care | Payer: Medicare Other | Attending: Internal Medicine | Admitting: Internal Medicine

## 2011-06-30 ENCOUNTER — Encounter (HOSPITAL_COMMUNITY): Payer: Self-pay | Admitting: *Deleted

## 2011-06-30 DIAGNOSIS — M25579 Pain in unspecified ankle and joints of unspecified foot: Secondary | ICD-10-CM | POA: Diagnosis not present

## 2011-06-30 DIAGNOSIS — D5700 Hb-SS disease with crisis, unspecified: Secondary | ICD-10-CM | POA: Diagnosis not present

## 2011-06-30 DIAGNOSIS — E876 Hypokalemia: Secondary | ICD-10-CM | POA: Diagnosis present

## 2011-06-30 DIAGNOSIS — D5701 Hb-SS disease with acute chest syndrome: Secondary | ICD-10-CM | POA: Diagnosis present

## 2011-06-30 DIAGNOSIS — R0789 Other chest pain: Secondary | ICD-10-CM | POA: Diagnosis present

## 2011-06-30 DIAGNOSIS — Z09 Encounter for follow-up examination after completed treatment for conditions other than malignant neoplasm: Secondary | ICD-10-CM | POA: Diagnosis not present

## 2011-06-30 DIAGNOSIS — D72829 Elevated white blood cell count, unspecified: Secondary | ICD-10-CM | POA: Diagnosis present

## 2011-06-30 DIAGNOSIS — R079 Chest pain, unspecified: Secondary | ICD-10-CM | POA: Diagnosis not present

## 2011-06-30 LAB — COMPREHENSIVE METABOLIC PANEL
CO2: 21 mEq/L (ref 19–32)
Calcium: 9.2 mg/dL (ref 8.4–10.5)
Chloride: 102 mEq/L (ref 96–112)
Creatinine, Ser: 0.55 mg/dL (ref 0.50–1.35)
GFR calc Af Amer: >90 mL/min (ref >90)
GFR calc non Af Amer: >90 mL/min (ref >90)
Glucose, Bld: 108 mg/dL — ABNORMAL HIGH (ref 70–99)
Total Bilirubin: 3.1 mg/dL — ABNORMAL HIGH (ref 0.3–1.2)

## 2011-06-30 LAB — DIFFERENTIAL
Basophils Relative: 1 % (ref 0–1)
Eosinophils Absolute: 0.4 10*3/uL (ref 0.0–0.7)
Lymphocytes Relative: 13 % (ref 12–46)
Monocytes Absolute: 2.4 10*3/uL — ABNORMAL HIGH (ref 0.1–1.0)
Neutrophils Relative %: 72 % (ref 43–77)

## 2011-06-30 LAB — CBC
Hemoglobin: 8.4 g/dL — ABNORMAL LOW (ref 13.0–17.0)
MCHC: 33.9 g/dL (ref 30.0–36.0)
Platelets: 502 10*3/uL — ABNORMAL HIGH (ref 150–400)
RBC: 2.77 MIL/uL — ABNORMAL LOW (ref 4.22–5.81)

## 2011-06-30 LAB — PREPARE RBC (CROSSMATCH)

## 2011-06-30 MED ORDER — MOXIFLOXACIN HCL 400 MG PO TABS
400.0000 mg | ORAL_TABLET | Freq: Every day | ORAL | Status: DC
  Administered 2011-06-30 – 2011-07-10 (×11): 400 mg via ORAL
  Filled 2011-06-30 (×13): qty 1

## 2011-06-30 MED ORDER — OXYCODONE HCL 40 MG PO TB12
80.0000 mg | ORAL_TABLET | Freq: Two times a day (BID) | ORAL | Status: DC
  Administered 2011-06-30 – 2011-07-11 (×22): 80 mg via ORAL
  Filled 2011-06-30 (×2): qty 2
  Filled 2011-06-30: qty 1
  Filled 2011-06-30 (×3): qty 2
  Filled 2011-06-30: qty 1
  Filled 2011-06-30 (×2): qty 2
  Filled 2011-06-30: qty 1
  Filled 2011-06-30 (×2): qty 2
  Filled 2011-06-30: qty 1
  Filled 2011-06-30 (×6): qty 2
  Filled 2011-06-30: qty 1
  Filled 2011-06-30 (×4): qty 2

## 2011-06-30 MED ORDER — DIPHENHYDRAMINE HCL 25 MG PO CAPS
25.0000 mg | ORAL_CAPSULE | ORAL | Status: DC | PRN
  Administered 2011-07-01: 50 mg via ORAL
  Filled 2011-06-30: qty 1

## 2011-06-30 MED ORDER — MOXIFLOXACIN HCL 400 MG PO TABS: 400.0000 mg | ORAL_TABLET | Freq: Every day | ORAL | Status: DC

## 2011-06-30 MED ORDER — IBUPROFEN 600 MG PO TABS
600.0000 mg | ORAL_TABLET | Freq: Three times a day (TID) | ORAL | Status: DC
  Administered 2011-06-30 – 2011-07-07 (×19): 600 mg via ORAL
  Filled 2011-06-30 (×26): qty 1

## 2011-06-30 MED ORDER — POTASSIUM CHLORIDE CRYS ER 20 MEQ PO TBCR
20.0000 meq | EXTENDED_RELEASE_TABLET | Freq: Two times a day (BID) | ORAL | Status: DC
  Administered 2011-06-30 – 2011-07-11 (×22): 20 meq via ORAL
  Filled 2011-06-30 (×23): qty 1

## 2011-06-30 MED ORDER — HYDROMORPHONE HCL PF 2 MG/ML IJ SOLN
4.0000 mg | Freq: Once | INTRAMUSCULAR | Status: AC
  Administered 2011-06-30: 4 mg via INTRAMUSCULAR

## 2011-06-30 MED ORDER — LEVALBUTEROL HCL 0.63 MG/3ML IN NEBU
0.6300 mg | INHALATION_SOLUTION | Freq: Four times a day (QID) | RESPIRATORY_TRACT | Status: DC | PRN
  Administered 2011-06-30: 0.63 mg via RESPIRATORY_TRACT
  Filled 2011-06-30: qty 3

## 2011-06-30 MED ORDER — ONDANSETRON HCL 4 MG/2ML IJ SOLN
4.0000 mg | INTRAMUSCULAR | Status: DC | PRN
  Administered 2011-06-30 – 2011-07-11 (×58): 4 mg via INTRAVENOUS
  Filled 2011-06-30 (×58): qty 2

## 2011-06-30 MED ORDER — DIPHENHYDRAMINE HCL 50 MG/ML IJ SOLN
12.5000 mg | INTRAMUSCULAR | Status: DC | PRN
  Administered 2011-06-30 – 2011-07-01 (×5): 25 mg via INTRAVENOUS
  Filled 2011-06-30 (×5): qty 1

## 2011-06-30 MED ORDER — DEXTROSE-NACL 5-0.45 % IV SOLN
INTRAVENOUS | Status: DC
  Administered 2011-06-30 (×2): via INTRAVENOUS

## 2011-06-30 MED ORDER — PANTOPRAZOLE SODIUM 40 MG PO TBEC
40.0000 mg | DELAYED_RELEASE_TABLET | Freq: Every day | ORAL | Status: DC
  Administered 2011-06-30 – 2011-07-11 (×11): 40 mg via ORAL
  Filled 2011-06-30 (×11): qty 1

## 2011-06-30 MED ORDER — HYDROMORPHONE HCL PF 4 MG/ML IJ SOLN
INTRAMUSCULAR | Status: AC
  Filled 2011-06-30: qty 1

## 2011-06-30 MED ORDER — FOLIC ACID 1 MG PO TABS
1.0000 mg | ORAL_TABLET | Freq: Every day | ORAL | Status: DC
  Administered 2011-06-30 – 2011-07-11 (×12): 1 mg via ORAL
  Filled 2011-06-30 (×12): qty 1

## 2011-06-30 MED ORDER — ONDANSETRON HCL 4 MG PO TABS
4.0000 mg | ORAL_TABLET | ORAL | Status: DC | PRN
  Filled 2011-06-30: qty 1

## 2011-06-30 MED ORDER — HYDROMORPHONE HCL PF 2 MG/ML IJ SOLN
2.0000 mg | INTRAMUSCULAR | Status: DC | PRN
  Administered 2011-06-30 – 2011-07-01 (×9): 4 mg via INTRAVENOUS
  Filled 2011-06-30 (×9): qty 2
  Filled 2011-06-30: qty 1

## 2011-06-30 MED ORDER — OLANZAPINE 10 MG PO TABS
10.0000 mg | ORAL_TABLET | Freq: Every day | ORAL | Status: DC
  Administered 2011-06-30 – 2011-07-10 (×11): 10 mg via ORAL
  Filled 2011-06-30 (×13): qty 1

## 2011-06-30 NOTE — Progress Notes (Signed)
In Day hospital, 3 RNs Attempt to get IV access, and IV RN attempt to get IV access, Unable to get IV access at this time. MD made aware. Received order for PICC line. IR notified. Awaiting return call from IR to work pt into schedule. To be given pain medication via IM at this time and oral hydration as tolerated until obtain IV access.

## 2011-06-30 NOTE — H&P (Signed)
Patient ID: GABE GLACE, male   DOB: 1980-12-28, 31 y.o.   MRN: [id number]  Chief Complaint  Patient presents with  . Sickle Cell Pain Crisis    rib cage pain, intermittent chest pain w/o cough    HPI PHILLIPS GOULETTE is a 31 y.o. male.  Patient comes to the sickle cell unit complaining of increasing chest pain and arm pain. He is been experiencing intermittent pain for the past week. The pain is sharp aching in nature on both sides. Pain is not pleuritic. He rates the pain as 8/10. He had been taking his pain medication including OxyContin twice a day. Despite this however his pain became unmanageable. Patient does not smoke or drink. He has not been doing any physical activity. No other new complaints. Present pain is typical for sickle cell crisis.   Past Medical History  Diagnosis Date  . Sickle cell anemia   . Blood transfusion   . 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 Hypokalemia   . Mood disorder   . Pulmonary embolism   . Avascular necrosis   . Leukocytosis     Chronic  . Thrombocytosis     Chronic    Past Surgical History  Procedure Date  . Right hip replacement     08/2006  . Cholecystectomy     01/2008  . Porta cath placement   . Porta cath removal   . Umbilical hernia repair     01/2008  . Excision of left periauricular cyst     10/2009  . Excision of right ear lobe cyst with primary closur     11/2007    Family History  Problem Relation Age of Onset  . Sickle cell anemia Mother   . Sickle cell anemia Father   . Sickle cell trait Brother     Social History History  Substance Use Topics  . Smoking status: Former Smoker -- 13 years    Types: Cigarettes    Quit date: 07/08/2010  . Smokeless tobacco: Never Used  . Alcohol Use: No    Allergies  Allergen Reactions  . Morphine And Related Hives and Rash    Pt states he is able to tolerate Dilaudid with no reactions.    Current Facility-Administered Medications    Medication Dose Route Frequency Provider Last Rate Last Dose  . dextrose 5 %-0.45 % sodium chloride infusion   Intravenous Continuous Gwenyth Bender, MD 75 mL/hr at 06/30/11 2035    . diphenhydrAMINE (BENADRYL) capsule 25-50 mg  25-50 mg Oral Q4H PRN Gwenyth Bender, MD       Or  . diphenhydrAMINE (BENADRYL) injection 12.5-25 mg  12.5-25 mg Intravenous Q4H PRN Gwenyth Bender, MD   25 mg at 06/30/11 1711  . folic acid (FOLVITE) tablet 1 mg  1 mg Oral Daily Gwenyth Bender, MD   1 mg at 06/30/11 0946  . HYDROmorphone (DILAUDID) 4 MG/ML injection           . HYDROmorphone (DILAUDID) injection 2-4 mg  2-4 mg Intravenous Q2H PRN Gwenyth Bender, MD   4 mg at 06/30/11 1859  . HYDROmorphone (DILAUDID) injection 4 mg  4 mg Intramuscular Once Gwenyth Bender, MD   4 mg at 06/30/11 0951  . ibuprofen (ADVIL,MOTRIN) tablet 600 mg  600 mg Oral TID Gwenyth Bender, MD      . levalbuterol [facility]) nebulizer solution 0.63 mg  0.63 mg Nebulization Q6H PRN Minerva Areola  Shona Simpson, MD      . moxifloxacin (AVELOX) tablet 400 mg  400 mg Oral q1800 Gwenyth Bender, MD      . OLANZapine [facility] tablet 10 mg  10 mg Oral QHS Gwenyth Bender, MD      . ondansetron [facility]) tablet 4 mg  4 mg Oral Q4H PRN Gwenyth Bender, MD       Or  . ondansetron [facility]) injection 4 mg  4 mg Intravenous Q4H PRN Gwenyth Bender, MD   4 mg at 06/30/11 1711  . oxyCODONE (OXYCONTIN) 12 hr tablet 80 mg  80 mg Oral Q12H Gwenyth Bender, MD      . pantoprazole (PROTONIX) EC tablet 40 mg  40 mg Oral Q1200 Gwenyth Bender, MD      . potassium chloride SA (K-DUR,KLOR-CON) CR tablet 20 mEq  20 mEq Oral BID Gwenyth Bender, MD      . DISCONTD: moxifloxacin (AVELOX) tablet 400 mg  400 mg Oral q1800 Gwenyth Bender, MD        Review of Systems As noted above. He denies fever chills or night sweats.   Blood pressure 106/64, pulse 65, temperature 98.9 F (37.2 C), temperature source Oral, resp. rate 18, height 6' (1.829 m), weight 165 lb (74.844 kg), SpO2 100.00%.  Physical Exam Well-developed well-nourished  black male in mild distress. HEENT: Mild sclera icterus. No sinus tenderness. TMs are clear. Nose mouth turbinate edema. Posterior pharynx clear. NECK: No enlarged thyroid. No posterior cervical nodes. LUNGS: Clear to auscultation. No vocal fremitus. Mild tenderness in the lower costochondral rib region bilaterally to palpation. No crepitation. No anterior chest wall tenderness. CV: Normal S1, S2 without S3. No murmurs or rubs. ABDOMEN: Bowel sounds are present. No epigastric tenderness. No masses appreciated. MSK: Chest wall tenderness is noted. No tenderness in lower lumbar sacral spine. Negative Homans. NEURO: Intact. SKIN: No active skin lesions.  Data Reviewed  Results for orders placed during the hospital encounter of 06/30/11 (from the past 48 hour(s))  COMPREHENSIVE METABOLIC PANEL     Status: Abnormal   Collection Time   06/30/11  9:25 AM      Component Value Range Comment   Sodium 135  135 - 145 (mEq/L)    Potassium 3.4 (*) 3.5 - 5.1 (mEq/L)    Chloride 102  96 - 112 (mEq/L)    CO2 21  19 - 32 (mEq/L)    Glucose, Bld 108 (*) 70 - 99 (mg/dL)    BUN 6  6 - 23 (mg/dL)    Creatinine, Ser 5.95  0.50 - 1.35 (mg/dL)    Calcium 9.2  8.4 - 10.5 (mg/dL)    Total Protein 7.5  6.0 - 8.3 (g/dL)    Albumin 4.2  3.5 - 5.2 (g/dL)    AST 21  0 - 37 (U/L)    ALT 13  0 - 53 (U/L)    Alkaline Phosphatase 109  39 - 117 (U/L)    Total Bilirubin 3.1 (*) 0.3 - 1.2 (mg/dL)    GFR calc non Af Amer >90  >90 (mL/min)    GFR calc Af Amer >90  >90 (mL/min)   CBC     Status: Abnormal   Collection Time   06/30/11  9:25 AM      Component Value Range Comment   WBC 19.7 (*) 4.0 - 10.5 (K/uL)    RBC 2.77 (*) 4.22 - 5.81 (MIL/uL)    Hemoglobin 8.4 (*) 13.0 -  17.0 (g/dL)    HCT 16.1 (*) 09.6 - 52.0 (%)    MCV 89.5  78.0 - 100.0 (fL)    MCH 30.3  26.0 - 34.0 (pg)    MCHC 33.9  30.0 - 36.0 (g/dL)    RDW 04.5 (*) 40.9 - 15.5 (%)    Platelets 502 (*) 150 - 400 (K/uL)   DIFFERENTIAL     Status: Abnormal     Collection Time   06/30/11  9:25 AM      Component Value Range Comment   Neutrophils Relative 72  43 - 77 (%)    Lymphocytes Relative 13  12 - 46 (%)    Monocytes Relative 12  3 - 12 (%)    Eosinophils Relative 2  0 - 5 (%)    Basophils Relative 1  0 - 1 (%)    Neutro Abs 14.1 (*) 1.7 - 7.7 (K/uL)    Lymphs Abs 2.6  0.7 - 4.0 (K/uL)    Monocytes Absolute 2.4 (*) 0.1 - 1.0 (K/uL)    Eosinophils Absolute 0.4  0.0 - 0.7 (K/uL)    Basophils Absolute 0.2 (*) 0.0 - 0.1 (K/uL)    RBC Morphology POLYCHROMASIA PRESENT   HOWELL/JOLLY BODIES     Assessment    Sickle cell crisis. Noncardiac chest pain. Leukocytosis secondary to #1. Rule out other. Sickle cell lung disease. Hypokalemia. Avascular necrosis involving the left shoulder. Mood disorder stable.    Plan    IV fluids. IV Dilaudid for pain control. Continue OxyContin. Empiric antibiotics. Replenish potassium. Exchange transfusion pending response to the above measures. Incentive spirometry.       Geanna Divirgilio 06/30/2011, 8:45 PM

## 2011-06-30 NOTE — Procedures (Signed)
Procedure : RT DL PICC 42 cm in length Complications : none TIP at cavo-atrial junction ready for immediate use  Pt tolerated well

## 2011-07-01 DIAGNOSIS — M25579 Pain in unspecified ankle and joints of unspecified foot: Secondary | ICD-10-CM | POA: Diagnosis not present

## 2011-07-01 DIAGNOSIS — D571 Sickle-cell disease without crisis: Secondary | ICD-10-CM | POA: Diagnosis not present

## 2011-07-01 DIAGNOSIS — D72829 Elevated white blood cell count, unspecified: Secondary | ICD-10-CM | POA: Diagnosis present

## 2011-07-01 DIAGNOSIS — D5700 Hb-SS disease with crisis, unspecified: Secondary | ICD-10-CM | POA: Diagnosis not present

## 2011-07-01 DIAGNOSIS — D7289 Other specified disorders of white blood cells: Secondary | ICD-10-CM

## 2011-07-01 DIAGNOSIS — R0789 Other chest pain: Secondary | ICD-10-CM | POA: Diagnosis present

## 2011-07-01 DIAGNOSIS — R079 Chest pain, unspecified: Secondary | ICD-10-CM | POA: Diagnosis not present

## 2011-07-01 DIAGNOSIS — E876 Hypokalemia: Secondary | ICD-10-CM | POA: Diagnosis present

## 2011-07-01 DIAGNOSIS — M2550 Pain in unspecified joint: Secondary | ICD-10-CM | POA: Diagnosis not present

## 2011-07-01 DIAGNOSIS — D5701 Hb-SS disease with acute chest syndrome: Secondary | ICD-10-CM | POA: Diagnosis present

## 2011-07-01 LAB — CBC
MCV: 88.3 fL (ref 78.0–100.0)
Platelets: 459 10*3/uL — ABNORMAL HIGH (ref 150–400)
RBC: 2.74 MIL/uL — ABNORMAL LOW (ref 4.22–5.81)
RDW: 17.2 % — ABNORMAL HIGH (ref 11.5–15.5)
WBC: 18.5 10*3/uL — ABNORMAL HIGH (ref 4.0–10.5)

## 2011-07-01 LAB — COMPREHENSIVE METABOLIC PANEL
ALT: 12 U/L (ref 0–53)
AST: 19 U/L (ref 0–37)
Albumin: 3.8 g/dL (ref 3.5–5.2)
Alkaline Phosphatase: 98 U/L (ref 39–117)
CO2: 23 mEq/L (ref 19–32)
Chloride: 104 mEq/L (ref 96–112)
Creatinine, Ser: 0.62 mg/dL (ref 0.50–1.35)
GFR calc non Af Amer: >90 mL/min (ref >90)
Potassium: 3.4 mEq/L — ABNORMAL LOW (ref 3.5–5.1)
Sodium: 136 mEq/L (ref 135–145)
Total Bilirubin: 3 mg/dL — ABNORMAL HIGH (ref 0.3–1.2)

## 2011-07-01 LAB — RETICULOCYTES: RBC.: 2.74 MIL/uL — ABNORMAL LOW (ref 4.22–5.81)

## 2011-07-01 MED ORDER — HYDROXYUREA 500 MG PO CAPS
500.0000 mg | ORAL_CAPSULE | Freq: Two times a day (BID) | ORAL | Status: DC
  Administered 2011-07-01 – 2011-07-11 (×21): 500 mg via ORAL
  Filled 2011-07-01 (×22): qty 1

## 2011-07-01 MED ORDER — DEXTROSE-NACL 5-0.45 % IV SOLN
INTRAVENOUS | Status: DC
  Administered 2011-07-01: 23:00:00 via INTRAVENOUS
  Administered 2011-07-01: 1000 mL via INTRAVENOUS
  Administered 2011-07-02 – 2011-07-03 (×2): via INTRAVENOUS
  Administered 2011-07-03: 1000 mL via INTRAVENOUS
  Administered 2011-07-04 – 2011-07-05 (×4): via INTRAVENOUS
  Administered 2011-07-05: 1000 mL via INTRAVENOUS
  Administered 2011-07-06 – 2011-07-10 (×7): via INTRAVENOUS

## 2011-07-01 MED ORDER — HYDROMORPHONE HCL PF 2 MG/ML IJ SOLN
4.0000 mg | INTRAMUSCULAR | Status: DC | PRN
  Administered 2011-07-01 – 2011-07-11 (×103): 4 mg via INTRAVENOUS
  Filled 2011-07-01 (×32): qty 2
  Filled 2011-07-01: qty 1
  Filled 2011-07-01 (×35): qty 2
  Filled 2011-07-01: qty 1
  Filled 2011-07-01 (×10): qty 2
  Filled 2011-07-01: qty 1
  Filled 2011-07-01 (×27): qty 2

## 2011-07-01 MED ORDER — DIPHENHYDRAMINE HCL 50 MG/ML IJ SOLN
12.5000 mg | INTRAMUSCULAR | Status: DC | PRN
  Administered 2011-07-01 – 2011-07-11 (×54): 12.5 mg via INTRAVENOUS
  Filled 2011-07-01 (×27): qty 1
  Filled 2011-07-01: qty 0.25
  Filled 2011-07-01 (×27): qty 1

## 2011-07-01 MED ORDER — HYDROMORPHONE HCL PF 2 MG/ML IJ SOLN
2.0000 mg | INTRAMUSCULAR | Status: DC | PRN
  Administered 2011-07-01 (×3): 2 mg via INTRAVENOUS
  Filled 2011-07-01 (×3): qty 1

## 2011-07-01 NOTE — Progress Notes (Signed)
Utilization review completed.  

## 2011-07-01 NOTE — H&P (Signed)
Chief Complaint: chest pain HPI: [name] is a 31 y.o. male who was admitted to the sickle cell center for observation on 06/30/11 with acute hemolytic/painful sickle cell crisis. His main complaint was pain in the lower chest/upper abdomen that was not relieved by his home pain meds. He received a transfusion last evening. He is continuing to complain of pain this morning rating it as a 5/10 even after receiving multiple doses of IV dilaudid throughout the night. He denies any shortness of breath.  Past Medical History  Diagnosis Date  . Sickle cell anemia   . Blood transfusion   . 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 Hypokalemia   . Mood disorder   . Pulmonary embolism   . Avascular necrosis   . Leukocytosis     Chronic  . Thrombocytosis     Chronic    Past Surgical History  Procedure Date  . Right hip replacement     08/2006  . Cholecystectomy     01/2008  . Porta cath placement   . Porta cath removal   . Umbilical hernia repair     01/2008  . Excision of left periauricular cyst     10/2009  . Excision of right ear lobe cyst with primary closur     11/2007    Family History  Problem Relation Age of Onset  . Sickle cell anemia Mother   . Sickle cell anemia Father   . Sickle cell trait Brother    Social History:  reports that he quit smoking about a year ago. His smoking use included Cigarettes. He quit after 13 years of use. He has never used smokeless tobacco. He reports that he does not drink alcohol or use illicit drugs.  Allergies:  Allergies  Allergen Reactions  . Morphine And Related Hives and Rash    Pt states he is able to tolerate Dilaudid with no reactions.    Medications Prior to Admission  Medication Sig Dispense Refill  . folic acid (FOLVITE) 1 MG tablet Take 1 tablet (1 mg total) by mouth daily.  60 tablet  2  . HYDROmorphone (DILAUDID) 4 MG tablet Take 1 tablet (4 mg total) by mouth every 4 (four) hours as needed  for pain. For pain  60 tablet  0  . hydroxyurea (HYDREA) 500 MG capsule Take 500 mg by mouth 2 (two) times daily. May take with food to minimize GI side effects.      Marland Kitchen ibuprofen (ADVIL,MOTRIN) 800 MG tablet Take 1 tablet (800 mg total) by mouth every 8 (eight) hours as needed. For pain.  90 tablet  3  . levalbuterol (XOPENEX) 0.63 MG/3ML nebulizer solution Take 3 mLs (0.63 mg total) by nebulization every 4 (four) hours as needed for wheezing or shortness of breath (QS x 1 month).  3 mL  12  . OLANZapine (ZYPREXA) 10 MG tablet Take 1 tablet (10 mg total) by mouth at bedtime.  30 tablet  1  . oxyCODONE (OXYCONTIN) 80 MG 12 hr tablet Take 1 tablet (80 mg total) by mouth every 12 (twelve) hours. pain  60 tablet  0  . potassium chloride SA (K-DUR,KLOR-CON) 20 MEQ tablet Take 1 tablet (20 mEq total) by mouth daily.  30 tablet  1  . zolpidem (AMBIEN) 10 MG tablet Take 1 tablet (10 mg total) by mouth at bedtime as needed. For sleep.  30 tablet  0  . OLANZapine (ZYPREXA) 10 MG tablet Take 10  mg by mouth at bedtime as needed. for sleep.      Marland Kitchen OLANZapine (ZYPREXA) 10 MG tablet Take 1 tablet (10 mg total) by mouth at bedtime.  30 tablet  1    Results for orders placed during the hospital encounter of 06/30/11 (from the past 48 hour(s))  COMPREHENSIVE METABOLIC PANEL     Status: Abnormal   Collection Time   06/30/11  9:25 AM      Component Value Range Comment   Sodium 135  135 - 145 (mEq/L)    Potassium 3.4 (*) 3.5 - 5.1 (mEq/L)    Chloride 102  96 - 112 (mEq/L)    CO2 21  19 - 32 (mEq/L)    Glucose, Bld 108 (*) 70 - 99 (mg/dL)    BUN 6  6 - 23 (mg/dL)    Creatinine, Ser 4.74  0.50 - 1.35 (mg/dL)    Calcium 9.2  8.4 - 10.5 (mg/dL)    Total Protein 7.5  6.0 - 8.3 (g/dL)    Albumin 4.2  3.5 - 5.2 (g/dL)    AST 21  0 - 37 (U/L)    ALT 13  0 - 53 (U/L)    Alkaline Phosphatase 109  39 - 117 (U/L)    Total Bilirubin 3.1 (*) 0.3 - 1.2 (mg/dL)    GFR calc non Af Amer >90  >90 (mL/min)    GFR calc Af Amer  >90  >90 (mL/min)   CBC     Status: Abnormal   Collection Time   06/30/11  9:25 AM      Component Value Range Comment   WBC 19.7 (*) 4.0 - 10.5 (K/uL)    RBC 2.77 (*) 4.22 - 5.81 (MIL/uL)    Hemoglobin 8.4 (*) 13.0 - 17.0 (g/dL)    HCT 25.9 (*) 56.3 - 52.0 (%)    MCV 89.5  78.0 - 100.0 (fL)    MCH 30.3  26.0 - 34.0 (pg)    MCHC 33.9  30.0 - 36.0 (g/dL)    RDW 87.5 (*) 64.3 - 15.5 (%)    Platelets 502 (*) 150 - 400 (K/uL)   DIFFERENTIAL     Status: Abnormal   Collection Time   06/30/11  9:25 AM      Component Value Range Comment   Neutrophils Relative 72  43 - 77 (%)    Lymphocytes Relative 13  12 - 46 (%)    Monocytes Relative 12  3 - 12 (%)    Eosinophils Relative 2  0 - 5 (%)    Basophils Relative 1  0 - 1 (%)    Neutro Abs 14.1 (*) 1.7 - 7.7 (K/uL)    Lymphs Abs 2.6  0.7 - 4.0 (K/uL)    Monocytes Absolute 2.4 (*) 0.1 - 1.0 (K/uL)    Eosinophils Absolute 0.4  0.0 - 0.7 (K/uL)    Basophils Absolute 0.2 (*) 0.0 - 0.1 (K/uL)    RBC Morphology POLYCHROMASIA PRESENT   HOWELL/JOLLY BODIES  TYPE AND SCREEN     Status: Normal (Preliminary result)   Collection Time   06/30/11  8:24 PM      Component Value Range Comment   ABO/RH(D) O POS      Antibody Screen NEG      Sample Expiration 07/03/2011      Unit Number [id number]      Blood Component Type RED CELLS,LR      Unit division 00      Status of  Unit ISSUED      Donor AG Type        Value: NEGATIVE FOR C ANTIGEN NEGATIVE FOR E ANTIGEN NEGATIVE FOR KELL ANTIGEN   Transfusion Status OK TO TRANSFUSE      Crossmatch Result COMPATIBLE     PREPARE RBC (CROSSMATCH)     Status: Normal   Collection Time   06/30/11  9:00 PM      Component Value Range Comment   Order Confirmation ORDER PROCESSED BY BLOOD BANK      Ir Fluoro Guide Cv Line Right  06/30/2011  *RADIOLOGY REPORT*  Clinical Data: Sickle Cell crisis in need of IV access  PICC LINE PLACEMENT WITH ULTRASOUND AND FLUOROSCOPIC  GUIDANCE  Fluoroscopy Time: 0.1 minutes.  The right  arm was prepped with chlorhexidine, draped in the usual sterile fashion using maximum barrier technique (cap and mask, sterile gown, sterile gloves, large sterile sheet, hand hygiene and cutaneous antisepsis) and infiltrated locally with 1% Lidocaine.  Ultrasound demonstrated patency of the right brachial vein, and this was documented with an image.  Under real-time ultrasound guidance, this vein was accessed with a 21 gauge micropuncture needle and image documentation was performed.  The needle was exchanged over a guidewire for a peel-away sheath through which a 5 [place] dual lumen PICC trimmed to 42 cm was advanced, positioned with its tip at the lower SVC/right atrial junction.  Fluoroscopy during the procedure and fluoro spot radiograph confirms appropriate catheter position.  The catheter was flushed, secured to the skin with Prolene sutures, and covered with a sterile dressing.  Complications:  None immediate  IMPRESSION: Successful right arm PICC line placement with ultrasound and fluoroscopic guidance.  The catheter is ready for use.  Read by: Anselm Pancoast, P.A.-C  Original Report Authenticated By: Judie Petit. Ruel Favors, M.D.   Ir US Guide Vasc Access Right  06/30/2011  *RADIOLOGY REPORT*  Clinical Data: Sickle Cell crisis in need of IV access  PICC LINE PLACEMENT WITH ULTRASOUND AND FLUOROSCOPIC  GUIDANCE  Fluoroscopy Time: 0.1 minutes.  The right arm was prepped with chlorhexidine, draped in the usual sterile fashion using maximum barrier technique (cap and mask, sterile gown, sterile gloves, large sterile sheet, hand hygiene and cutaneous antisepsis) and infiltrated locally with 1% Lidocaine.  Ultrasound demonstrated patency of the right brachial vein, and this was documented with an image.  Under real-time ultrasound guidance, this vein was accessed with a 21 gauge micropuncture needle and image documentation was performed.  The needle was exchanged over a guidewire for a peel-away sheath through  which a 5 [place] dual lumen PICC trimmed to 42 cm was advanced, positioned with its tip at the lower SVC/right atrial junction.  Fluoroscopy during the procedure and fluoro spot radiograph confirms appropriate catheter position.  The catheter was flushed, secured to the skin with Prolene sutures, and covered with a sterile dressing.  Complications:  None immediate  IMPRESSION: Successful right arm PICC line placement with ultrasound and fluoroscopic guidance.  The catheter is ready for use.  Read by: Anselm Pancoast, P.A.-C  Original Report Authenticated By: Judie Petit. Ruel Favors, M.D.      Review of Systems  Constitutional: Negative for fever and chills.  HENT: Negative for ear pain, congestion, sore throat and neck pain.   Respiratory: Negative for cough, hemoptysis, sputum production and shortness of breath.   Cardiovascular: Positive for chest pain (lower anterior chest bilateral).  Gastrointestinal: Positive for abdominal pain (LUQ and RUQ). Negative for heartburn, nausea, vomiting  and diarrhea.  Genitourinary: Negative for dysuria, urgency, frequency, hematuria and flank pain.  Musculoskeletal: Negative for myalgias and joint pain.  Skin: Negative for rash.  Neurological: Negative for dizziness and headaches.  Endo/Heme/Allergies: Does not bruise/bleed easily.  All other systems reviewed and are negative.    Blood pressure 113/68, pulse 64, temperature 98.5 F (36.9 C), temperature source Oral, resp. rate 18, height 6' (1.829 m), weight 74.844 kg (165 lb), SpO2 99.00%. Physical Exam  Nursing note and vitals reviewed. Constitutional: He is oriented to person, place, and time. He appears well-developed and well-nourished.  HENT:  Head: Normocephalic.  Mouth/Throat: No oropharyngeal exudate.  Eyes: Pupils are equal, round, and reactive to light. No scleral icterus.  Neck: No thyromegaly present.  Cardiovascular: Normal rate, regular rhythm and normal heart sounds.   No murmur  heard. Respiratory: Effort normal and breath sounds normal. No respiratory distress. He has no wheezes. He has no rales. He exhibits tenderness.  GI: He exhibits no distension. There is tenderness (mild RUQ/LUQ). There is no rebound and no guarding.  Musculoskeletal: Normal range of motion.  Lymphadenopathy:    He has no cervical adenopathy.  Neurological: He is alert and oriented to person, place, and time.  Skin: Skin is warm and dry. No rash noted. No erythema.  Psychiatric: He has a normal mood and affect.                Assessment/Plan 1.) Sickle cell crisis as evidenced by hemolysis and lower chest/upper abdominal pain. Improving but still not at baseline. Admit for further treatment with IVF, pain control. Already received blood exchange, will recheck CBC/BMET this morning.  2.) Leukocytosis: possibly related to acute sickle cell crisis but will continue empiric Avelox. 3.) Otherwise, continue current home meds. Possible D/C home later today or tomorrow.  Kandas Oliveto A 07/01/2011, 5:40 AM

## 2011-07-01 NOTE — Progress Notes (Signed)
Patient ID: [name], male   DOB: [birth year], 31 y.o.   MRN: [id number] report called to Kathleene Hazel, RN on 3E. Pt transported via wheelchair to 1320 with admission services staff. Pt with no complaints at time of discharge. All belongings with pt.

## 2011-07-01 NOTE — Progress Notes (Signed)
PROGRESS NOTE  [name] [name] DOB: 1980/06/22 DOA: 06/30/2011 PCP: [name], MD, MD  Brief narrative: [name] is a 31 y.o.  African American male who was admitted to the [facility] cell center for observation on 06/30/11 with acute hemolytic/painful sickle cell crisis. His main complaint was pain in the lower chest/upper abdomen that was not relieved by his home pain meds. He received a transfusion last evening. Rates pain 6/10  Assessment/Plan: Active Problems:  Sickle cell pain crisis manage with IVF, pain medication , antihistamines and monitor laboratory data   Leukocytosis elevated WBC on a broad spectrum ABT and encourage IS   Code Status: Full  Family Communication: None Disposition Plan: Discharge to home when stable  Medical Consultants:  None  Other consultants:    Antibiotics:  Avelox   Subjective  [name] is lying in the bed in no acute distress states pain remains in shoulder and back denies priapism. Asked nursing for a IS encourage use.    Objective    Interim History: Stable over night    Objective: Filed Vitals:   07/01/11 0550 07/01/11 1005 07/01/11 1440 07/01/11 2244  BP: 115/76 101/62 113/64 112/66  Pulse: 79 62 73 72  Temp: 98 F (36.7 C) 98 F (36.7 C) 98.2 F (36.8 C) 98 F (36.7 C)  TempSrc: Oral Oral Oral Oral  Resp: 18 15 16 16   Height: 6' (1.829 m)     Weight: 80.468 kg (177 lb 6.4 oz)     SpO2: 98% 100% 100% 100%    Intake/Output Summary (Last 24 hours) at 07/01/11 2336 Last data filed at 07/01/11 2245  Gross per 24 hour  Intake 1247.5 ml  Output      0 ml  Net 1247.5 ml    Exam: Generall: He is oriented to person, place, and time. He appears well-developed and well-nourished.  HENT: Head: Normocephalic.  Mouth/Throat: No oropharyngeal exudate.  Eyes: Pupils are equal, round, and reactive to light. No scleral icterus.  Neck: No thyromegaly present.  Cardiovascular: Normal rate,  regular rhythm and normal heart sounds.  No murmur heard.  Respiratory: Effort normal and breath sounds normal. No respiratory distress. He has no wheezes. He has no rales.   GI: He exhibits no distension.. There is no rebound and no guarding.  Musculoskeletal: Normal range of motion.  Lymphadenopathy:  no cervical adenopathy.  Neurological: He is alert and oriented to person, place, and time.  Skin: Skin is warm and dry. No rash noted. No erythema.  Psychiatric: He has a normal mood and affect.   Data Reviewed: Basic Metabolic Panel:  Lab 07/01/11 8119 06/30/11 0925  NA 136 135  K 3.4* 3.4*  CL 104 102  CO2 23 21  GLUCOSE 115* 108*  BUN 5* 6  CREATININE 0.62 0.55  CALCIUM 8.6 9.2  MG -- --  PHOS -- --   GFR Estimated Creatinine Clearance: 145.5 ml/min (by C-G formula based on Cr of 0.62). Liver Function Tests:  Lab 07/01/11 0813 06/30/11 0925  AST 19 21  ALT 12 13  ALKPHOS 98 109  BILITOT 3.0* 3.1*  PROT 7.1 7.5  ALBUMIN 3.8 4.2   No results found for this basename: LIPASE:5,AMYLASE:5 in the last 168 hours No results found for this basename: AMMONIA:5 in the last 168 hours Coagulation profile No results found for this basename: INR:5,PROTIME:5 in the last 168 hours  CBC:  Lab 07/01/11 0813 06/30/11 0925  WBC 18.5* 19.7*  NEUTROABS --  14.1*  HGB 8.4* 8.4*  HCT 24.2* 24.8*  MCV 88.3 89.5  PLT 459* 502*   Cardiac Enzymes: No results found for this basename: CKTOTAL:5,CKMB:5,CKMBINDEX:5,TROPONINI:5 in the last 168 hours BNP: No components found with this basename: POCBNP:5 CBG: No results found for this basename: GLUCAP:5 in the last 168 hours D-Dimer No results found for this basename: DDIMER:2 in the last 72 hours Hgb A1c No results found for this basename: HGBA1C:2 in the last 72 hours Lipid Profile No results found for this basename: CHOL:2,HDL:2,LDLCALC:2,TRIG:2,CHOLHDL:2,LDLDIRECT:2 in the last 72 hours Thyroid function studies No results found for  this basename: TSH,T4TOTAL,FREET3,T3FREE,THYROIDAB in the last 72 hours Anemia work up  [employer] 07/01/11 0813  VITAMINB12 --  FOLATE --  FERRITIN --  TIBC --  IRON --  RETICCTPCT 9.4*   Microbiology No results found for this or any previous visit (from the past 240 hour(s)).  Procedures and Diagnostic Studies: Dg Chest 2 View  06/23/2011  *RADIOLOGY REPORT*  Clinical Data: Sickle cell, leukocytosis.  CHEST - 2 VIEW  Comparison: 05/30/2011  Findings: Mild cardiac enlargement and central vascular fullness is similar to prior.  There is a mild interstitial prominence / peribronchial thickening, which may be chronic, similar to prior. Increased markings at the lung bases right greater than left, are similar to prior.  No new focal areas of consolidation.  No pleural effusion or pneumothorax.  Surgical clips right upper quadrant.  No acute osseous finding.  IMPRESSION: Cardiomegaly with central vascular congestion.  Chronic interstitial / bronchitic changes.  Mild lung base opacities are similar to the prior, favored to be scarring or atelectasis.  Infiltrate not entirely excluded.  Original Report Authenticated By: Waneta Martins, M.D.   Ir Fluoro Guide Cv Line Right  06/30/2011  *RADIOLOGY REPORT*  Clinical Data: Sickle Cell crisis in need of IV access  PICC LINE PLACEMENT WITH ULTRASOUND AND FLUOROSCOPIC  GUIDANCE  Fluoroscopy Time: 0.1 minutes.  The right arm was prepped with chlorhexidine, draped in the usual sterile fashion using maximum barrier technique (cap and mask, sterile gown, sterile gloves, large sterile sheet, hand hygiene and cutaneous antisepsis) and infiltrated locally with 1% Lidocaine.  Ultrasound demonstrated patency of the right brachial vein, and this was documented with an image.  Under real-time ultrasound guidance, this vein was accessed with a 21 gauge micropuncture needle and image documentation was performed.  The needle was exchanged over a guidewire for a peel-away  sheath through which a 5 [place] dual lumen PICC trimmed to 42 cm was advanced, positioned with its tip at the lower SVC/right atrial junction.  Fluoroscopy during the procedure and fluoro spot radiograph confirms appropriate catheter position.  The catheter was flushed, secured to the skin with Prolene sutures, and covered with a sterile dressing.  Complications:  None immediate  IMPRESSION: Successful right arm PICC line placement with ultrasound and fluoroscopic guidance.  The catheter is ready for use.  Read by: Anselm Pancoast, P.A.-C  Original Report Authenticated By: Judie Petit. Ruel Favors, M.D.   Ir US Guide Vasc Access Right  06/30/2011  *RADIOLOGY REPORT*  Clinical Data: Sickle Cell crisis in need of IV access  PICC LINE PLACEMENT WITH ULTRASOUND AND FLUOROSCOPIC  GUIDANCE  Fluoroscopy Time: 0.1 minutes.  The right arm was prepped with chlorhexidine, draped in the usual sterile fashion using maximum barrier technique (cap and mask, sterile gown, sterile gloves, large sterile sheet, hand hygiene and cutaneous antisepsis) and infiltrated locally with 1% Lidocaine.  Ultrasound demonstrated patency of the right  brachial vein, and this was documented with an image.  Under real-time ultrasound guidance, this vein was accessed with a 21 gauge micropuncture needle and image documentation was performed.  The needle was exchanged over a guidewire for a peel-away sheath through which a 5 [place] dual lumen PICC trimmed to 42 cm was advanced, positioned with its tip at the lower SVC/right atrial junction.  Fluoroscopy during the procedure and fluoro spot radiograph confirms appropriate catheter position.  The catheter was flushed, secured to the skin with Prolene sutures, and covered with a sterile dressing.  Complications:  None immediate  IMPRESSION: Successful right arm PICC line placement with ultrasound and fluoroscopic guidance.  The catheter is ready for use.  Read by: Anselm Pancoast, P.A.-C  Original Report  Authenticated By: Judie Petit. Ruel Favors, M.D.    Scheduled Meds:   . folic acid  1 mg Oral Daily  . hydroxyurea  500 mg Oral BID  . ibuprofen  600 mg Oral TID  . moxifloxacin  400 mg Oral q1800  . OLANZapine  10 mg Oral QHS  . oxyCODONE  80 mg Oral Q12H  . pantoprazole  40 mg Oral Q1200  . potassium chloride  20 mEq Oral BID   Continuous Infusions:   . dextrose 5 % and 0.45% NaCl 1,000 mL (07/01/11 0923)  . DISCONTD: dextrose 5 % and 0.45% NaCl 75 mL/hr at 06/30/11 2035      LOS: 1 day  [name] NP [phone] 07/01/2011, 11:36 PM

## 2011-07-01 NOTE — Progress Notes (Addendum)
2340 initiated blood exchange. Removed 300 ml from right arm DL PICC as ordered without difficulty. Pt tolerated procedure with no complaints. 1 unit PRBCs to be transfused as ordered.

## 2011-07-01 NOTE — H&P (Signed)
I have seen and examined patient with Ms Georgia Dom and discussed plan of treatment as above.

## 2011-07-02 DIAGNOSIS — D5700 Hb-SS disease with crisis, unspecified: Secondary | ICD-10-CM | POA: Diagnosis not present

## 2011-07-02 LAB — TYPE AND SCREEN
ABO/RH(D): O POS
Antibody Screen: NEGATIVE

## 2011-07-02 NOTE — Progress Notes (Signed)
Subjective:  Patient complains of bilateral pain in the upper back and shoulders grade 8/10. Denies any cough fever chills. Denies any urinary complaints  Objective:  Vital Signs in the last 24 hours: Temp:  [97.7 F (36.5 C)-98.2 F (36.8 C)] 97.9 F (36.6 C) (06/01 0609) Pulse Rate:  [72-73] 73  (06/01 0609) Resp:  [16-17] 17  (06/01 0609) BP: (112-129)/(64-72) 129/71 mmHg (06/01 0609) SpO2:  [97 %-100 %] 97 % (06/01 0609) Weight:  [80.967 kg (178 lb 8 oz)] 80.967 kg (178 lb 8 oz) (06/01 0609)  Intake/Output from previous day: 05/31 0701 - 06/01 0700 In: 1000 [P.O.:1000] Out: -  Intake/Output from this shift:    Physical Exam: Neck: no adenopathy, no carotid bruit, no JVD and supple, symmetrical, trachea midline Lungs: clear to auscultation bilaterally Heart: regular rate and rhythm, S1, S2 normal, no murmur, click, rub or gallop Abdomen: soft, non-tender; bowel sounds normal; no masses,  no organomegaly Extremities: extremities normal, atraumatic, no cyanosis or edema  Lab Results:  Basename 07/01/11 0813 06/30/11 0925  WBC 18.5* 19.7*  HGB 8.4* 8.4*  PLT 459* 502*    Basename 07/01/11 0813 06/30/11 0925  NA 136 135  K 3.4* 3.4*  CL 104 102  CO2 23 21  GLUCOSE 115* 108*  BUN 5* 6  CREATININE 0.62 0.55   No results found for this basename: TROPONINI:2,CK,MB:2 in the last 72 hours Hepatic Function Panel  Basename 07/01/11 0813  PROT 7.1  ALBUMIN 3.8  AST 19  ALT 12  ALKPHOS 98  BILITOT 3.0*  BILIDIR --  IBILI --   No results found for this basename: CHOL in the last 72 hours No results found for this basename: PROTIME in the last 72 hours  Imaging: Imaging results have been reviewed and Ir Fluoro Guide Cv Line Right  06/30/2011  *RADIOLOGY REPORT*  Clinical Data: Sickle Cell crisis in need of IV access  PICC LINE PLACEMENT WITH ULTRASOUND AND FLUOROSCOPIC  GUIDANCE  Fluoroscopy Time: 0.1 minutes.  The right arm was prepped with chlorhexidine, draped in  the usual sterile fashion using maximum barrier technique (cap and mask, sterile gown, sterile gloves, large sterile sheet, hand hygiene and cutaneous antisepsis) and infiltrated locally with 1% Lidocaine.  Ultrasound demonstrated patency of the right brachial vein, and this was documented with an image.  Under real-time ultrasound guidance, this vein was accessed with a 21 gauge micropuncture needle and image documentation was performed.  The needle was exchanged over a guidewire for a peel-away sheath through which a 5 [place] dual lumen PICC trimmed to 42 cm was advanced, positioned with its tip at the lower SVC/right atrial junction.  Fluoroscopy during the procedure and fluoro spot radiograph confirms appropriate catheter position.  The catheter was flushed, secured to the skin with Prolene sutures, and covered with a sterile dressing.  Complications:  None immediate  IMPRESSION: Successful right arm PICC line placement with ultrasound and fluoroscopic guidance.  The catheter is ready for use.  Read by: Anselm Pancoast, P.A.-C  Original Report Authenticated By: Judie Petit. Ruel Favors, M.D.   Ir US Guide Vasc Access Right  06/30/2011  *RADIOLOGY REPORT*  Clinical Data: Sickle Cell crisis in need of IV access  PICC LINE PLACEMENT WITH ULTRASOUND AND FLUOROSCOPIC  GUIDANCE  Fluoroscopy Time: 0.1 minutes.  The right arm was prepped with chlorhexidine, draped in the usual sterile fashion using maximum barrier technique (cap and mask, sterile gown, sterile gloves, large sterile sheet, hand hygiene and cutaneous antisepsis)  and infiltrated locally with 1% Lidocaine.  Ultrasound demonstrated patency of the right brachial vein, and this was documented with an image.  Under real-time ultrasound guidance, this vein was accessed with a 21 gauge micropuncture needle and image documentation was performed.  The needle was exchanged over a guidewire for a peel-away sheath through which a 5 [place] dual lumen PICC trimmed to 42 cm  was advanced, positioned with its tip at the lower SVC/right atrial junction.  Fluoroscopy during the procedure and fluoro spot radiograph confirms appropriate catheter position.  The catheter was flushed, secured to the skin with Prolene sutures, and covered with a sterile dressing.  Complications:  None immediate  IMPRESSION: Successful right arm PICC line placement with ultrasound and fluoroscopic guidance.  The catheter is ready for use.  Read by: Anselm Pancoast, P.A.-C  Original Report Authenticated By: Judie Petit. Ruel Favors, M.D.    Cardiac Studies:  Assessment/Plan:  Sickle cell crisis Chronic pain syndrome Leukocytosis improved  Avascular neck doses involving left shoulder Mood disorder Plan Continue present management  LOS: 2 days    Ailea Rhatigan N 07/02/2011, 10:58 AM

## 2011-07-03 DIAGNOSIS — D5700 Hb-SS disease with crisis, unspecified: Secondary | ICD-10-CM | POA: Diagnosis not present

## 2011-07-03 NOTE — Progress Notes (Signed)
Subjective:  Patient complains of sharp chest pain off and on and back pain grade 8/10. No anginal chest pain  Objective:  Vital Signs in the last 24 hours: Temp:  [98.2 F (36.8 C)-98.5 F (36.9 C)] 98.5 F (36.9 C) (06/02 0512) Pulse Rate:  [67-79] 79  (06/02 0512) Resp:  [16-20] 20  (06/02 0512) BP: (115-129)/(70-81) 122/74 mmHg (06/02 0512) SpO2:  [97 %-99 %] 98 % (06/02 0512) Weight:  [83.7 kg (184 lb 8.4 oz)] 83.7 kg (184 lb 8.4 oz) (06/02 0512)  Intake/Output from previous day: 06/01 0701 - 06/02 0700 In: 2220 [P.O.:720; I.V.:1500] Out: -  Intake/Output from this shift: Total I/O In: 240 [P.O.:240] Out: -   Physical Exam: Neck: no adenopathy, no carotid bruit, no JVD and supple, symmetrical, trachea midline Lungs: clear to auscultation bilaterally Heart: regular rate and rhythm, S1, S2 normal, no murmur, click, rub or gallop Abdomen: soft, non-tender; bowel sounds normal; no masses,  no organomegaly Extremities: extremities normal, atraumatic, no cyanosis or edema  Lab Results:  Basename 07/01/11 0813  WBC 18.5*  HGB 8.4*  PLT 459*    Basename 07/01/11 0813  NA 136  K 3.4*  CL 104  CO2 23  GLUCOSE 115*  BUN 5*  CREATININE 0.62   No results found for this basename: TROPONINI:2,CK,MB:2 in the last 72 hours Hepatic Function Panel  Basename 07/01/11 0813  PROT 7.1  ALBUMIN 3.8  AST 19  ALT 12  ALKPHOS 98  BILITOT 3.0*  BILIDIR --  IBILI --   No results found for this basename: CHOL in the last 72 hours No results found for this basename: PROTIME in the last 72 hours  Imaging: Imaging results have been reviewed and No results found.  Cardiac Studies:  Assessment/Plan:  Sickle cell crisis  Chronic pain syndrome  Leukocytosis improved  Avascular neck doses involving left shoulder  Mood disorder Hypokalemia Plan Continue IV hydration and pain meds Check labs in a.m.  LOS: 3 days    Kaitlin Ardito N 07/03/2011, 10:34 AM

## 2011-07-04 DIAGNOSIS — D5700 Hb-SS disease with crisis, unspecified: Secondary | ICD-10-CM | POA: Diagnosis not present

## 2011-07-04 DIAGNOSIS — M25579 Pain in unspecified ankle and joints of unspecified foot: Secondary | ICD-10-CM | POA: Diagnosis not present

## 2011-07-04 DIAGNOSIS — R0789 Other chest pain: Secondary | ICD-10-CM | POA: Diagnosis not present

## 2011-07-04 DIAGNOSIS — D5701 Hb-SS disease with acute chest syndrome: Secondary | ICD-10-CM | POA: Diagnosis not present

## 2011-07-04 DIAGNOSIS — D72829 Elevated white blood cell count, unspecified: Secondary | ICD-10-CM | POA: Diagnosis not present

## 2011-07-04 DIAGNOSIS — E876 Hypokalemia: Secondary | ICD-10-CM | POA: Diagnosis not present

## 2011-07-04 LAB — BASIC METABOLIC PANEL
BUN: 6 mg/dL (ref 6–23)
CO2: 22 mEq/L (ref 19–32)
Chloride: 109 mEq/L (ref 96–112)
GFR calc Af Amer: >90 mL/min (ref >90)
Glucose, Bld: 100 mg/dL — ABNORMAL HIGH (ref 70–99)
Potassium: 3.9 mEq/L (ref 3.5–5.1)

## 2011-07-04 LAB — CBC
HCT: 22.3 % — ABNORMAL LOW (ref 39.0–52.0)
Hemoglobin: 7.5 g/dL — ABNORMAL LOW (ref 13.0–17.0)
MCHC: 33.6 g/dL (ref 30.0–36.0)
MCV: 89.6 fL (ref 78.0–100.0)

## 2011-07-04 MED ORDER — CYCLOBENZAPRINE HCL 5 MG PO TABS
5.0000 mg | ORAL_TABLET | Freq: Three times a day (TID) | ORAL | Status: DC | PRN
  Administered 2011-07-04 – 2011-07-05 (×2): 5 mg via ORAL
  Filled 2011-07-04 (×2): qty 1

## 2011-07-04 MED ORDER — ENOXAPARIN SODIUM 40 MG/0.4ML ~~LOC~~ SOLN
40.0000 mg | SUBCUTANEOUS | Status: DC
  Administered 2011-07-04 – 2011-07-10 (×7): 40 mg via SUBCUTANEOUS
  Filled 2011-07-04 (×8): qty 0.4

## 2011-07-04 NOTE — Progress Notes (Signed)
ANTICOAGULATION CONSULT NOTE - Initial Consult  Pharmacy Consult for Lovenox Indication: VTE prophylaxis  Allergies  Allergen Reactions  . Morphine And Related Hives and Rash    Pt states he is able to tolerate Dilaudid with no reactions.   Patient Measurements: Height: 6' (182.9 cm) Weight: 186 lb 6.4 oz (84.55 kg) IBW/kg (Calculated) : 77.6   Vital Signs: Temp: 98.5 F (36.9 C) (06/03 1039) Temp src: Oral (06/03 1039) BP: 129/80 mmHg (06/03 1039) Pulse Rate: 72  (06/03 1039)  Labs:  Basename 07/04/11 0414  HGB 7.5*  HCT 22.3*  PLT 406*  APTT --  LABPROT --  INR --  HEPARINUNFRC --  CREATININE 0.63  CKTOTAL --  CKMB --  TROPONINI --    Estimated Creatinine Clearance: 145.5 ml/min (by C-G formula based on Cr of 0.63).   Medical History: Past Medical History  Diagnosis Date  . Sickle cell anemia   . Blood transfusion   . 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 Hypokalemia   . Mood disorder   . Pulmonary embolism   . Avascular necrosis   . Leukocytosis     Chronic  . Thrombocytosis     Chronic    Medications:  Scheduled:    . enoxaparin (LOVENOX) injection  40 mg Subcutaneous Q24H  . folic acid  1 mg Oral Daily  . hydroxyurea  500 mg Oral BID  . ibuprofen  600 mg Oral TID  . moxifloxacin  400 mg Oral q1800  . OLANZapine  10 mg Oral QHS  . oxyCODONE  80 mg Oral Q12H  . pantoprazole  40 mg Oral Q1200  . potassium chloride  20 mEq Oral BID    Assessment:  31yo M admitted with sickle cell crisis.  Pharmacy asked to dose Lovenox for VTE prophylaxis.  Plan:   Will start Lovenox 40mg  SQ q24h.  Pharmacy will sign-off. Please re-consult if renal fxn changes.  Charolotte Eke, PharmD, pager [phone]. 07/04/2011,12:13 PM.

## 2011-07-04 NOTE — Progress Notes (Signed)
PROGRESS NOTE  [name] [name] DOB: [birth year] DOA: 06/30/2011 PCP: [name], MD, MD  Brief narrative: [name] is a 31 y.o.  African American male who was admitted to the [facility] cell center for observation on 06/30/11 with acute hemolytic/painful sickle cell crisis. His main complaint was pain in the lower chest/upper abdomen that was not relieved by his home pain meds. He received a transfusion last evening. Rates pain 7/10  Assessment/Plan: Active Problems:  Sickle cell pain crisis manage with IVF, pain medication , antihistamines and monitor laboratory data   Leukocytosis elevated WBC on a broad spectrum ABT and encourage IS   Code Status: Full  Family Communication: None Disposition Plan: Discharge to home when stable  Medical Consultants:  None  Other consultants:    Antibiotics:  Avelox   Subjective  [name] is complaining of sharp pain originates from side and moves to epigastric pain. Pain is 7/10 very uncomfortable .    Objective    Interim History: Stable over night    Objective: Filed Vitals:   07/03/11 2019 07/04/11 0136 07/04/11 0507 07/04/11 1039  BP: 114/68 121/76 119/80 129/80  Pulse: 78 78 79 72  Temp: 98.7 F (37.1 C) 98.2 F (36.8 C) 98.1 F (36.7 C) 98.5 F (36.9 C)  TempSrc: Oral Oral Oral Oral  Resp: 18 16 16 18   Height:      Weight:   84.55 kg (186 lb 6.4 oz)   SpO2: 97% 97% 96% 99%    Intake/Output Summary (Last 24 hours) at 07/04/11 1124 Last data filed at 07/03/11 1437  Gross per 24 hour  Intake 1192.08 ml  Output      0 ml  Net 1192.08 ml    Exam: Generall: He is oriented to person, place, and time. He appears well-developed and well-nourished.  HENT: Head: Normocephalic.  Mouth/Throat: No oropharyngeal exudate.  Eyes: Pupils are equal, round, and reactive to light. No scleral icterus.  Neck: No thyromegaly present.  Cardiovascular: Normal rate, regular rhythm and normal heart  sounds.  No murmur heard.  Respiratory: Effort normal and breath sounds normal. No respiratory distress. He has no wheezes. He has no rales.   GI: He exhibits no distension.. There is no rebound and no guarding.  Musculoskeletal: Normal range of motion. Muscle skeletal or spasm Lymphadenopathy:  no cervical adenopathy.  Neurological: He is alert and oriented to person, place, and time.  Skin: Skin is warm and dry. No rash noted. No erythema.  Psychiatric: He has a normal mood and affect.   Data Reviewed: Basic Metabolic Panel:  Lab 07/04/11 8119 07/01/11 0813 06/30/11 0925  NA 140 136 135  K 3.9 3.4* --  CL 109 104 102  CO2 22 23 21   GLUCOSE 100* 115* 108*  BUN 6 5* 6  CREATININE 0.63 0.62 0.55  CALCIUM 8.7 8.6 9.2  MG -- -- --  PHOS -- -- --   GFR Estimated Creatinine Clearance: 145.5 ml/min (by C-G formula based on Cr of 0.63). Liver Function Tests:  Lab 07/01/11 0813 06/30/11 0925  AST 19 21  ALT 12 13  ALKPHOS 98 109  BILITOT 3.0* 3.1*  PROT 7.1 7.5  ALBUMIN 3.8 4.2   No results found for this basename: LIPASE:5,AMYLASE:5 in the last 168 hours No results found for this basename: AMMONIA:5 in the last 168 hours Coagulation profile No results found for this basename: INR:5,PROTIME:5 in the last 168 hours  CBC:  Lab 07/04/11 0414 07/01/11 0813  06/30/11 0925  WBC 18.1* 18.5* 19.7*  NEUTROABS -- -- 14.1*  HGB 7.5* 8.4* 8.4*  HCT 22.3* 24.2* 24.8*  MCV 89.6 88.3 89.5  PLT 406* 459* 502*   Cardiac Enzymes: No results found for this basename: CKTOTAL:5,CKMB:5,CKMBINDEX:5,TROPONINI:5 in the last 168 hours BNP: No components found with this basename: POCBNP:5 CBG: No results found for this basename: GLUCAP:5 in the last 168 hours D-Dimer No results found for this basename: DDIMER:2 in the last 72 hours Hgb A1c No results found for this basename: HGBA1C:2 in the last 72 hours Lipid Profile No results found for this basename:  CHOL:2,HDL:2,LDLCALC:2,TRIG:2,CHOLHDL:2,LDLDIRECT:2 in the last 72 hours Thyroid function studies No results found for this basename: TSH,T4TOTAL,FREET3,T3FREE,THYROIDAB in the last 72 hours Anemia work up No results found for this basename: VITAMINB12:2,FOLATE:2,FERRITIN:2,TIBC:2,IRON:2,RETICCTPCT:2 in the last 72 hours Microbiology No results found for this or any previous visit (from the past 240 hour(s)).  Procedures and Diagnostic Studies: Dg Chest 2 View  06/23/2011  *RADIOLOGY REPORT*  Clinical Data: Sickle cell, leukocytosis.  CHEST - 2 VIEW  Comparison: 05/30/2011  Findings: Mild cardiac enlargement and central vascular fullness is similar to prior.  IMPRESSION: Cardiomegaly with central vascular congestion.  Chronic interstitial / bronchitic changes.  Mild lung base opacities are similar to the prior, favored to be scarring or atelectasis.  Infiltrate not entirely excluded.  Original Report Authenticated By: Waneta Martins, M.D.   Ir Fluoro Guide Cv Line Right  06/30/2011  *RADIOLOGY REPORT*  Clinical Data: Sickle Cell crisis in need of IV access  PICC LINE PLACEMENT WITH ULTRASOUND AND FLUOROSCOPIC  GUIDANCE  Fluoroscopy Time: 0.1 minutes.  Complications:  None immediate   IMPRESSION: Successful right arm PICC line placement with ultrasound and fluoroscopic guidance.  The catheter is ready for use.  Read by: Anselm Pancoast, P.A.-C  Original Report Authenticated By: Judie Petit. Ruel Favors, M.D.   Ir US Guide Vasc Access Right  06/30/2011  *RADIOLOGY REPORT*  Clinical Data: Sickle Cell crisis in need of IV access  PICC LINE PLACEMENT WITH ULTRASOUND AND FLUOROSCOPIC  GUIDANCE  Fluoroscopy Time: 0.1 minutes.   IMPRESSION: Successful right arm PICC line placement with ultrasound and fluoroscopic guidance.  The catheter is ready for use.  Read by: Anselm Pancoast, P.A.-C  Original Report Authenticated By: Judie Petit. Ruel Favors, M.D.    Scheduled Meds:    . folic acid  1 mg Oral Daily  .  hydroxyurea  500 mg Oral BID  . ibuprofen  600 mg Oral TID  . moxifloxacin  400 mg Oral q1800  . OLANZapine  10 mg Oral QHS  . oxyCODONE  80 mg Oral Q12H  . pantoprazole  40 mg Oral Q1200  . potassium chloride  20 mEq Oral BID   Continuous Infusions:    . dextrose 5 % and 0.45% NaCl 125 mL/hr at 07/04/11 0752      LOS: 4 days  [name] NP [phone] 07/04/2011, 11:24 AM

## 2011-07-05 DIAGNOSIS — D72829 Elevated white blood cell count, unspecified: Secondary | ICD-10-CM | POA: Diagnosis not present

## 2011-07-05 DIAGNOSIS — R0789 Other chest pain: Secondary | ICD-10-CM | POA: Diagnosis not present

## 2011-07-05 DIAGNOSIS — E876 Hypokalemia: Secondary | ICD-10-CM | POA: Diagnosis not present

## 2011-07-05 DIAGNOSIS — D5700 Hb-SS disease with crisis, unspecified: Secondary | ICD-10-CM | POA: Diagnosis not present

## 2011-07-05 DIAGNOSIS — M25579 Pain in unspecified ankle and joints of unspecified foot: Secondary | ICD-10-CM | POA: Diagnosis not present

## 2011-07-05 DIAGNOSIS — D5701 Hb-SS disease with acute chest syndrome: Secondary | ICD-10-CM | POA: Diagnosis not present

## 2011-07-05 LAB — COMPREHENSIVE METABOLIC PANEL
Alkaline Phosphatase: 105 U/L (ref 39–117)
BUN: 7 mg/dL (ref 6–23)
Chloride: 108 mEq/L (ref 96–112)
Creatinine, Ser: 0.63 mg/dL (ref 0.50–1.35)
GFR calc Af Amer: >90 mL/min (ref >90)
Glucose, Bld: 96 mg/dL (ref 70–99)
Potassium: 4 mEq/L (ref 3.5–5.1)
Total Bilirubin: 3 mg/dL — ABNORMAL HIGH (ref 0.3–1.2)
Total Protein: 6.9 g/dL (ref 6.0–8.3)

## 2011-07-05 LAB — FERRITIN: Ferritin: 1738 ng/mL — ABNORMAL HIGH (ref 22–322)

## 2011-07-05 LAB — PHOSPHORUS: Phosphorus: 4.2 mg/dL (ref 2.3–4.6)

## 2011-07-05 LAB — CBC
Platelets: 364 10*3/uL (ref 150–400)
RDW: 17.8 % — ABNORMAL HIGH (ref 11.5–15.5)
WBC: 16.4 10*3/uL — ABNORMAL HIGH (ref 4.0–10.5)

## 2011-07-05 LAB — MRSA PCR SCREENING: MRSA by PCR: NEGATIVE

## 2011-07-05 MED ORDER — CYCLOBENZAPRINE HCL 5 MG PO TABS
7.5000 mg | ORAL_TABLET | Freq: Three times a day (TID) | ORAL | Status: DC | PRN
  Administered 2011-07-05 – 2011-07-11 (×14): 7.5 mg via ORAL
  Filled 2011-07-05 (×11): qty 1.5

## 2011-07-05 NOTE — Progress Notes (Signed)
PROGRESS NOTE  CHRYSTOPHER STANGL BMW:[id number] DOB: 1980/10/04 DOA: 06/30/2011 PCP: [name], MD, MD  Assessment/Plan: Active Problems:  Sickle cell pain crisis manage with IVF, pain medication , antihistamines and monitor laboratory data   Leukocytosis elevated WBC on a broad spectrum ABT and encourage IS   Code Status: Full  Family Communication: None Disposition Plan: Discharge to home when stable  Medical Consultants:  None  Other consultants:    Antibiotics:  Avelox   Subjective  Mr. Rusher is  continuing to complaining of sharp pain originates from side and moves to epigastric pain. Pain is 8/10 states the new pill eases the pain but wears off (flexiril)  . Discussed I would increase dose not frequency . Not able to control pain at home at this time.    Objective    Interim History: Stable over night    Objective: Filed Vitals:   07/04/11 1313 07/04/11 2015 07/05/11 0218 07/05/11 0603  BP: 126/78 129/82 132/82 110/68  Pulse: 79 90 90 76  Temp: 98.3 F (36.8 C) 97.3 F (36.3 C) 98.1 F (36.7 C) 98.1 F (36.7 C)  TempSrc: Oral Oral Oral Oral  Resp: 18 18 18 18   Height:      Weight:    84.687 kg (186 lb 11.2 oz)  SpO2: 100% 97% 96% 98%    Intake/Output Summary (Last 24 hours) at 07/05/11 1216 Last data filed at 07/05/11 0530  Gross per 24 hour  Intake    600 ml  Output      0 ml  Net    600 ml    Exam: Generall: He is oriented to person, place, and time. He appears well-developed and well-nourished.  HENT: Head: Normocephalic.  Mouth/Throat: No oropharyngeal exudate.  Eyes: Pupils are equal, round, and reactive to light. No scleral icterus.  Neck: No thyromegaly present.  Cardiovascular: Normal rate, regular rhythm and normal heart sounds.  No murmur heard.  Respiratory: Effort normal and breath sounds normal. No respiratory distress. He has no wheezes. He has no rales.   GI: He exhibits no distension.. There is no rebound and no  guarding.  Musculoskeletal: Normal range of motion. Muscle skeletal or spasm Lymphadenopathy:  no cervical adenopathy.  Neurological: He is alert and oriented to person, place, and time.  Skin: Skin is warm and dry.  Psychiatric: He has a normal mood and affect. (irritated)   Data Reviewed: Basic Metabolic Panel:  Lab 07/05/11 2725 07/04/11 0414 07/01/11 0813 06/30/11 0925  NA 139 140 136 135  K 4.0 3.9 -- --  CL 108 109 104 102  CO2 24 22 23 21   GLUCOSE 96 100* 115* 108*  BUN 7 6 5* 6  CREATININE 0.63 0.63 0.62 0.55  CALCIUM 8.7 8.7 8.6 9.2  MG 1.8 -- -- --  PHOS 4.2 -- -- --   GFR Estimated Creatinine Clearance: 145.5 ml/min (by C-G formula based on Cr of 0.63). Liver Function Tests:  Lab 07/05/11 0620 07/01/11 0813 06/30/11 0925  AST 54* 19 21  ALT 46 12 13  ALKPHOS 105 98 109  BILITOT 3.0* 3.0* 3.1*  PROT 6.9 7.1 7.5  ALBUMIN 3.8 3.8 4.2   No results found for this basename: LIPASE:5,AMYLASE:5 in the last 168 hours No results found for this basename: AMMONIA:5 in the last 168 hours Coagulation profile No results found for this basename: INR:5,PROTIME:5 in the last 168 hours  CBC:  Lab 07/05/11 0620 07/04/11 0414 07/01/11 0813 06/30/11 0925  WBC 16.4* 18.1*  18.5* 19.7*  NEUTROABS -- -- -- 14.1*  HGB 7.1* 7.5* 8.4* 8.4*  HCT 21.0* 22.3* 24.2* 24.8*  MCV 89.7 89.6 88.3 89.5  PLT 364 406* 459* 502*   Cardiac Enzymes: No results found for this basename: CKTOTAL:5,CKMB:5,CKMBINDEX:5,TROPONINI:5 in the last 168 hours BNP: No components found with this basename: POCBNP:5 CBG: No results found for this basename: GLUCAP:5 in the last 168 hours D-Dimer No results found for this basename: DDIMER:2 in the last 72 hours Hgb A1c No results found for this basename: HGBA1C:2 in the last 72 hours Lipid Profile No results found for this basename: CHOL:2,HDL:2,LDLCALC:2,TRIG:2,CHOLHDL:2,LDLDIRECT:2 in the last 72 hours Thyroid function studies No results found for this  basename: TSH,T4TOTAL,FREET3,T3FREE,THYROIDAB in the last 72 hours Anemia work up No results found for this basename: VITAMINB12:2,FOLATE:2,FERRITIN:2,TIBC:2,IRON:2,RETICCTPCT:2 in the last 72 hours Microbiology No results found for this or any previous visit (from the past 240 hour(s)).  Procedures and Diagnostic Studies: Dg Chest 2 View  06/23/2011  *RADIOLOGY REPORT*  Clinical Data: Sickle cell, leukocytosis.  CHEST - 2 VIEW  Comparison: 05/30/2011  Findings: Mild cardiac enlargement and central vascular fullness is similar to prior.  IMPRESSION: Cardiomegaly with central vascular congestion.  Chronic interstitial / bronchitic changes.  Mild lung base opacities are similar to the prior, favored to be scarring or atelectasis.  Infiltrate not entirely excluded.  Original Report Authenticated By: Waneta Martins, M.D.   Ir Fluoro Guide Cv Line Right  06/30/2011  *RADIOLOGY REPORT*  Clinical Data: Sickle Cell crisis in need of IV access  PICC LINE PLACEMENT WITH ULTRASOUND AND FLUOROSCOPIC  GUIDANCE  Fluoroscopy Time: 0.1 minutes.  Complications:  None immediate   IMPRESSION: Successful right arm PICC line placement with ultrasound and fluoroscopic guidance.  The catheter is ready for use.  Read by: Anselm Pancoast, P.A.-C  Original Report Authenticated By: Judie Petit. Ruel Favors, M.D.   Ir US Guide Vasc Access Right  06/30/2011  *RADIOLOGY REPORT*  Clinical Data: Sickle Cell crisis in need of IV access  PICC LINE PLACEMENT WITH ULTRASOUND AND FLUOROSCOPIC  GUIDANCE  Fluoroscopy Time: 0.1 minutes.   IMPRESSION: Successful right arm PICC line placement with ultrasound and fluoroscopic guidance.  The catheter is ready for use.  Read by: Anselm Pancoast, P.A.-C  Original Report Authenticated By: Judie Petit. Ruel Favors, M.D.    Scheduled Meds:    . enoxaparin (LOVENOX) injection  40 mg Subcutaneous Q24H  . folic acid  1 mg Oral Daily  . hydroxyurea  500 mg Oral BID  . ibuprofen  600 mg Oral TID  .  moxifloxacin  400 mg Oral q1800  . OLANZapine  10 mg Oral QHS  . oxyCODONE  80 mg Oral Q12H  . pantoprazole  40 mg Oral Q1200  . potassium chloride  20 mEq Oral BID   Continuous Infusions:    . dextrose 5 % and 0.45% NaCl 75 mL/hr at 07/05/11 0855      LOS: 5 days  [name] NP [phone] 07/05/2011, 12:16 PM

## 2011-07-06 DIAGNOSIS — D5701 Hb-SS disease with acute chest syndrome: Secondary | ICD-10-CM | POA: Diagnosis not present

## 2011-07-06 DIAGNOSIS — D5700 Hb-SS disease with crisis, unspecified: Secondary | ICD-10-CM | POA: Diagnosis not present

## 2011-07-06 DIAGNOSIS — M25579 Pain in unspecified ankle and joints of unspecified foot: Secondary | ICD-10-CM | POA: Diagnosis not present

## 2011-07-06 DIAGNOSIS — R0789 Other chest pain: Secondary | ICD-10-CM | POA: Diagnosis not present

## 2011-07-06 DIAGNOSIS — D72829 Elevated white blood cell count, unspecified: Secondary | ICD-10-CM | POA: Diagnosis not present

## 2011-07-06 DIAGNOSIS — E876 Hypokalemia: Secondary | ICD-10-CM | POA: Diagnosis not present

## 2011-07-06 LAB — COMPREHENSIVE METABOLIC PANEL
ALT: 59 U/L — ABNORMAL HIGH (ref 0–53)
AST: 59 U/L — ABNORMAL HIGH (ref 0–37)
CO2: 25 mEq/L (ref 19–32)
Chloride: 102 mEq/L (ref 96–112)
GFR calc non Af Amer: >90 mL/min (ref >90)
Potassium: 3.9 mEq/L (ref 3.5–5.1)
Sodium: 136 mEq/L (ref 135–145)
Total Bilirubin: 3.3 mg/dL — ABNORMAL HIGH (ref 0.3–1.2)

## 2011-07-06 LAB — CBC
Hemoglobin: 7.3 g/dL — ABNORMAL LOW (ref 13.0–17.0)
MCH: 30.5 pg (ref 26.0–34.0)
MCHC: 34 g/dL (ref 30.0–36.0)
Platelets: 387 10*3/uL (ref 150–400)

## 2011-07-06 NOTE — Progress Notes (Addendum)
PROGRESS NOTE  [name] AVW:[id number] DOB: 1980-11-28 DOA: 06/30/2011 PCP: [name], MD, MD  Assessment/Plan: Active Problems:  Sickle cell pain crisis manage with IVF, pain medication , antihistamines and monitor laboratory data   Leukocytosis elevated WBC on a broad spectrum ABT and encourage IS   Code Status: Full  Family Communication: None Disposition Plan: Discharge to home when stable  Medical Consultants:  None  Other consultants:    Antibiotics:  Avelox   Subjective  [name] is  continuing to complaining of sharp pain originates from side and moves to epigastric pain. Pain is 7/10 not ready to transition from IV to po .Not able to control pain at home at this time.    Objective    Interim History: Stable over night    Objective: Filed Vitals:   07/05/11 2244 07/06/11 0155 07/06/11 0557 07/06/11 1105  BP: 153/90 123/69 136/76 115/71  Pulse: 80 67 76 71  Temp: 98.2 F (36.8 C) 98.2 F (36.8 C) 98.3 F (36.8 C) 98.8 F (37.1 C)  TempSrc: Oral Oral Oral Oral  Resp: 17 18 17 18   Height:      Weight:   83.961 kg (185 lb 1.6 oz)   SpO2: 100% 100% 100% 99%    Intake/Output Summary (Last 24 hours) at 07/06/11 1428 Last data filed at 07/06/11 1339  Gross per 24 hour  Intake 7265.83 ml  Output      0 ml  Net 7265.83 ml    Exam: Generall: He is oriented to person, place, and time. He appears well-developed and well-nourished.  HENT: Head: Normocephalic.  Mouth/Throat: No oropharyngeal exudate.  Eyes: Pupils are equal, round, and reactive to light. No scleral icterus.  Neck: No thyromegaly present.  Cardiovascular: Normal rate, regular rhythm and normal heart sounds.  No murmur heard.  Respiratory: Effort normal and breath sounds normal. No respiratory distress. He has no wheezes. He has no rales.   GI: He exhibits no distension.. There is no rebound and no guarding.  Musculoskeletal: Normal range of motion. Muscle skeletal  pain v/s spasm Lymphadenopathy:  no cervical adenopathy.  Neurological: He is alert and oriented to person, place, and time.  Skin: Skin is warm and dry.  Psychiatric: He has a normal mood and affect. ( very irritated)   Data Reviewed: Basic Metabolic Panel:  Lab 07/06/11 8295 07/05/11 0620 07/04/11 0414 07/01/11 0813 06/30/11 0925  NA 136 139 140 136 135  K 3.9 4.0 -- -- --  CL 102 108 109 104 102  CO2 25 24 22 23 21   GLUCOSE 109* 96 100* 115* 108*  BUN 7 7 6  5* 6  CREATININE 0.64 0.63 0.63 0.62 0.55  CALCIUM 8.8 8.7 8.7 8.6 9.2  MG -- 1.8 -- -- --  PHOS -- 4.2 -- -- --   GFR Estimated Creatinine Clearance: 145.5 ml/min (by C-G formula based on Cr of 0.64). Liver Function Tests:  Lab 07/06/11 0604 07/05/11 0620 07/01/11 0813 06/30/11 0925  AST 59* 54* 19 21  ALT 59* 46 12 13  ALKPHOS 102 105 98 109  BILITOT 3.3* 3.0* 3.0* 3.1*  PROT 6.8 6.9 7.1 7.5  ALBUMIN 3.7 3.8 3.8 4.2   No results found for this basename: LIPASE:5,AMYLASE:5 in the last 168 hours No results found for this basename: AMMONIA:5 in the last 168 hours Coagulation profile No results found for this basename: INR:5,PROTIME:5 in the last 168 hours  CBC:  Lab 07/06/11 0604 07/05/11 6213 07/04/11 0414 07/01/11 0813  06/30/11 0925  WBC 13.8* 16.4* 18.1* 18.5* 19.7*  NEUTROABS -- -- -- -- 14.1*  HGB 7.3* 7.1* 7.5* 8.4* 8.4*  HCT 21.5* 21.0* 22.3* 24.2* 24.8*  MCV 90.0 89.7 89.6 88.3 89.5  PLT 387 364 406* 459* 502*   Cardiac Enzymes: No results found for this basename: CKTOTAL:5,CKMB:5,CKMBINDEX:5,TROPONINI:5 in the last 168 hours BNP: No components found with this basename: POCBNP:5 CBG: No results found for this basename: GLUCAP:5 in the last 168 hours D-Dimer No results found for this basename: DDIMER:2 in the last 72 hours Hgb A1c No results found for this basename: HGBA1C:2 in the last 72 hours Lipid Profile No results found for this basename: CHOL:2,HDL:2,LDLCALC:2,TRIG:2,CHOLHDL:2,LDLDIRECT:2  in the last 72 hours Thyroid function studies No results found for this basename: TSH,T4TOTAL,FREET3,T3FREE,THYROIDAB in the last 72 hours Anemia work up  [employer] 07/05/11 0620  VITAMINB12 --  FOLATE --  FERRITIN 1738*  TIBC --  IRON --  RETICCTPCT --   Microbiology Recent Results (from the past 240 hour(s))  MRSA PCR SCREENING     Status: Normal   Collection Time   07/05/11 11:45 AM      Component Value Range Status Comment   MRSA by PCR NEGATIVE  NEGATIVE  Final     Procedures and Diagnostic Studies: Dg Chest 2 View  06/23/2011  *RADIOLOGY REPORT*  Clinical Data: Sickle cell, leukocytosis.  CHEST - 2 VIEW  Comparison: 05/30/2011  Findings: Mild cardiac enlargement and central vascular fullness is similar to prior.  IMPRESSION: Cardiomegaly with central vascular congestion.  Chronic interstitial / bronchitic changes.  Mild lung base opacities are similar to the prior, favored to be scarring or atelectasis.  Infiltrate not entirely excluded.  Original Report Authenticated By: Waneta Martins, M.D.   Ir Fluoro Guide Cv Line Right  06/30/2011  *RADIOLOGY REPORT*  Clinical Data: Sickle Cell crisis in need of IV access  PICC LINE PLACEMENT WITH ULTRASOUND AND FLUOROSCOPIC  GUIDANCE  Fluoroscopy Time: 0.1 minutes.  Complications:  None immediate   IMPRESSION: Successful right arm PICC line placement with ultrasound and fluoroscopic guidance.  The catheter is ready for use.  Read by: Anselm Pancoast, P.A.-C  Original Report Authenticated By: Judie Petit. Ruel Favors, M.D.   Ir US Guide Vasc Access Right  06/30/2011  *RADIOLOGY REPORT*  Clinical Data: Sickle Cell crisis in need of IV access  PICC LINE PLACEMENT WITH ULTRASOUND AND FLUOROSCOPIC  GUIDANCE  Fluoroscopy Time: 0.1 minutes.   IMPRESSION: Successful right arm PICC line placement with ultrasound and fluoroscopic guidance.  The catheter is ready for use.  Read by: Anselm Pancoast, P.A.-C  Original Report Authenticated By: Judie Petit. Ruel Favors, M.D.    Scheduled Meds:    . enoxaparin (LOVENOX) injection  40 mg Subcutaneous Q24H  . folic acid  1 mg Oral Daily  . hydroxyurea  500 mg Oral BID  . ibuprofen  600 mg Oral TID  . moxifloxacin  400 mg Oral q1800  . OLANZapine  10 mg Oral QHS  . oxyCODONE  80 mg Oral Q12H  . pantoprazole  40 mg Oral Q1200  . potassium chloride  20 mEq Oral BID   Continuous Infusions:    . dextrose 5 % and 0.45% NaCl 1,000 mL (07/05/11 2146)      LOS: 6 days  [name] NP [phone] 07/06/2011, 2:28 PM  Patient continues with chest pain as noted. His hematologic picture still consistent with active hemolysis. We'll followup in a.m. We will consider exchange transfusion if  no improvement. We will continue on IV pain medication until transition to home.  Purvi Ruehl L. [name], M.D.

## 2011-07-07 ENCOUNTER — Inpatient Hospital Stay (HOSPITAL_COMMUNITY): Payer: Medicare Other

## 2011-07-07 DIAGNOSIS — D72829 Elevated white blood cell count, unspecified: Secondary | ICD-10-CM | POA: Diagnosis not present

## 2011-07-07 DIAGNOSIS — R079 Chest pain, unspecified: Secondary | ICD-10-CM | POA: Diagnosis not present

## 2011-07-07 DIAGNOSIS — D5700 Hb-SS disease with crisis, unspecified: Secondary | ICD-10-CM | POA: Diagnosis not present

## 2011-07-07 DIAGNOSIS — R0789 Other chest pain: Secondary | ICD-10-CM | POA: Diagnosis not present

## 2011-07-07 DIAGNOSIS — M25579 Pain in unspecified ankle and joints of unspecified foot: Secondary | ICD-10-CM | POA: Diagnosis not present

## 2011-07-07 DIAGNOSIS — E876 Hypokalemia: Secondary | ICD-10-CM | POA: Diagnosis not present

## 2011-07-07 DIAGNOSIS — D571 Sickle-cell disease without crisis: Secondary | ICD-10-CM | POA: Diagnosis not present

## 2011-07-07 DIAGNOSIS — D5701 Hb-SS disease with acute chest syndrome: Secondary | ICD-10-CM | POA: Diagnosis not present

## 2011-07-07 LAB — COMPREHENSIVE METABOLIC PANEL
ALT: 61 U/L — ABNORMAL HIGH (ref 0–53)
Calcium: 8.7 mg/dL (ref 8.4–10.5)
GFR calc Af Amer: >90 mL/min (ref >90)
Glucose, Bld: 113 mg/dL — ABNORMAL HIGH (ref 70–99)
Sodium: 138 mEq/L (ref 135–145)
Total Protein: 6.8 g/dL (ref 6.0–8.3)

## 2011-07-07 LAB — CBC
HCT: 21.5 % — ABNORMAL LOW (ref 39.0–52.0)
Hemoglobin: 7.2 g/dL — ABNORMAL LOW (ref 13.0–17.0)
MCHC: 33.5 g/dL (ref 30.0–36.0)

## 2011-07-07 MED ORDER — KETOROLAC TROMETHAMINE 30 MG/ML IJ SOLN
30.0000 mg | Freq: Four times a day (QID) | INTRAMUSCULAR | Status: AC
  Administered 2011-07-07 – 2011-07-08 (×5): 30 mg via INTRAVENOUS
  Filled 2011-07-07 (×5): qty 1

## 2011-07-07 MED ORDER — ALTEPLASE 2 MG IJ SOLR
2.0000 mg | Freq: Once | INTRAMUSCULAR | Status: AC
  Administered 2011-07-07: 2 mg
  Filled 2011-07-07: qty 2

## 2011-07-07 MED ORDER — SODIUM CHLORIDE 0.9 % IJ SOLN
10.0000 mL | INTRAMUSCULAR | Status: DC | PRN
  Administered 2011-07-07: 10 mL

## 2011-07-07 NOTE — Progress Notes (Signed)
tPA to dwell in purple port for 2 hours. [name]

## 2011-07-07 NOTE — Progress Notes (Signed)
Withdrew 200cc blood from RT DL PICC per order.  Pt. Tolerated procedure without difficulty.  Primary RN aware and hanging unit of blood.

## 2011-07-07 NOTE — Progress Notes (Deleted)
Pt declined RW when [facility] delivered to room.  Kathi Der RNC-MNN, BSN

## 2011-07-08 DIAGNOSIS — E876 Hypokalemia: Secondary | ICD-10-CM | POA: Diagnosis not present

## 2011-07-08 DIAGNOSIS — M25579 Pain in unspecified ankle and joints of unspecified foot: Secondary | ICD-10-CM | POA: Diagnosis not present

## 2011-07-08 DIAGNOSIS — R0789 Other chest pain: Secondary | ICD-10-CM | POA: Diagnosis not present

## 2011-07-08 DIAGNOSIS — D72829 Elevated white blood cell count, unspecified: Secondary | ICD-10-CM | POA: Diagnosis not present

## 2011-07-08 DIAGNOSIS — D5701 Hb-SS disease with acute chest syndrome: Secondary | ICD-10-CM | POA: Diagnosis not present

## 2011-07-08 DIAGNOSIS — D5700 Hb-SS disease with crisis, unspecified: Secondary | ICD-10-CM | POA: Diagnosis not present

## 2011-07-08 LAB — COMPREHENSIVE METABOLIC PANEL
Alkaline Phosphatase: 112 U/L (ref 39–117)
BUN: 6 mg/dL (ref 6–23)
CO2: 24 mEq/L (ref 19–32)
Chloride: 104 mEq/L (ref 96–112)
GFR calc Af Amer: >90 mL/min (ref >90)
Glucose, Bld: 102 mg/dL — ABNORMAL HIGH (ref 70–99)
Potassium: 4 mEq/L (ref 3.5–5.1)
Total Bilirubin: 2.5 mg/dL — ABNORMAL HIGH (ref 0.3–1.2)

## 2011-07-08 LAB — TYPE AND SCREEN
Antibody Screen: NEGATIVE
Unit division: 0

## 2011-07-08 LAB — CBC
MCHC: 33.5 g/dL (ref 30.0–36.0)
Platelets: 379 10*3/uL (ref 150–400)
RDW: 20.2 % — ABNORMAL HIGH (ref 11.5–15.5)
WBC: 13.7 10*3/uL — ABNORMAL HIGH (ref 4.0–10.5)

## 2011-07-08 NOTE — Progress Notes (Signed)
Subjective:  Patient gradually feeling better.. Pain in anterior chest now 7/10. No signifcant cough. Patient developed sharp pain in the right ankle today. He notes problems with tightness and occasional wheezing in his chest. No pleuritic pain.   Allergies  Allergen Reactions  . Morphine And Related Hives and Rash    Pt states he is able to tolerate Dilaudid with no reactions.   Current Facility-Administered Medications  Medication Dose Route Frequency Provider Last Rate Last Dose  . cyclobenzaprine (FLEXERIL) tablet 7.5 mg  7.5 mg Oral TID PRN Gwenyth Bender, MD   7.5 mg at 07/08/11 1146  . dextrose 5 %-0.45 % sodium chloride infusion   Intravenous Continuous Keitha Butte, NP 75 mL/hr at 07/08/11 1623    . diphenhydrAMINE (BENADRYL) injection 12.5 mg  12.5 mg Intravenous Q4H PRN Gwenyth Bender, MD   12.5 mg at 07/08/11 1927  . enoxaparin (LOVENOX) injection 40 mg  40 mg Subcutaneous Q24H Gwenyth Bender, MD   40 mg at 07/08/11 1400  . folic acid (FOLVITE) tablet 1 mg  1 mg Oral Daily Gwenyth Bender, MD   1 mg at 07/08/11 0946  . HYDROmorphone (DILAUDID) injection 4 mg  4 mg Intravenous Q2H PRN Gwenyth Bender, MD   4 mg at 07/08/11 1927  . hydroxyurea (HYDREA) capsule 500 mg  500 mg Oral BID Gerome Apley [place], PA   500 mg at 07/08/11 0946  . ketorolac (TORADOL) 30 MG/ML injection 30 mg  30 mg Intravenous Q6H Gwenyth Bender, MD   30 mg at 07/08/11 1146  . levalbuterol (XOPENEX) nebulizer solution 0.63 mg  0.63 mg Nebulization Q6H PRN Gwenyth Bender, MD   0.63 mg at 06/30/11 2113  . moxifloxacin (AVELOX) tablet 400 mg  400 mg Oral q1800 Gwenyth Bender, MD   400 mg at 07/08/11 1735  . OLANZapine (ZYPREXA) tablet 10 mg  10 mg Oral QHS Gwenyth Bender, MD   10 mg at 07/07/11 2154  . ondansetron (ZOFRAN) tablet 4 mg  4 mg Oral Q4H PRN Gwenyth Bender, MD       Or  . ondansetron [facility]) injection 4 mg  4 mg Intravenous Q4H PRN Gwenyth Bender, MD   4 mg at 07/08/11 1927  . oxyCODONE (OXYCONTIN) 12 hr tablet 80 mg  80 mg Oral  Q12H Gwenyth Bender, MD   80 mg at 07/08/11 0946  . pantoprazole (PROTONIX) EC tablet 40 mg  40 mg Oral Q1200 Gwenyth Bender, MD   40 mg at 07/08/11 1146  . potassium chloride SA (K-DUR,KLOR-CON) CR tablet 20 mEq  20 mEq Oral BID Gwenyth Bender, MD   20 mEq at 07/08/11 0946  . sodium chloride 0.9 % injection 10-40 mL  10-40 mL Intracatheter PRN Gwenyth Bender, MD   10 mL at 07/07/11 2004    Objective: Blood pressure 127/83, pulse 79, temperature 98.3 F (36.8 C), temperature source Oral, resp. rate 18, height 6' (1.829 m), weight 186 lb 6.4 oz (84.55 kg), SpO2 98.00%.  Well-developed well-nourished black male in mild distress.Marland Kitchen HEENT:no sinus tenderness . Mild sclera icterus. NECK:no posterior cervical nodes. LUNGS:diminished breath sounds. No wheezes of vocal fremitus appreciated. ZO:XWRUEA S1, S2 witout S3. ABD:no tenderness. VWU:JWJXBJ right ankle medially. Without swelling or increased warmth. NEURO:intact.  Lab results: Results for orders placed during the hospital encounter of 06/30/11 (from the past 48 hour(s))  COMPREHENSIVE METABOLIC PANEL     Status: Abnormal  Collection Time   07/07/11  5:45 AM      Component Value Range Comment   Sodium 138  135 - 145 (mEq/L)    Potassium 3.9  3.5 - 5.1 (mEq/L)    Chloride 105  96 - 112 (mEq/L)    CO2 25  19 - 32 (mEq/L)    Glucose, Bld 113 (*) 70 - 99 (mg/dL)    BUN 7  6 - 23 (mg/dL)    Creatinine, Ser 9.60  0.50 - 1.35 (mg/dL)    Calcium 8.7  8.4 - 10.5 (mg/dL)    Total Protein 6.8  6.0 - 8.3 (g/dL)    Albumin 3.8  3.5 - 5.2 (g/dL)    AST 51 (*) 0 - 37 (U/L)    ALT 61 (*) 0 - 53 (U/L)    Alkaline Phosphatase 110  39 - 117 (U/L)    Total Bilirubin 2.8 (*) 0.3 - 1.2 (mg/dL)    GFR calc non Af Amer >90  >90 (mL/min)    GFR calc Af Amer >90  >90 (mL/min)   CBC     Status: Abnormal   Collection Time   07/07/11  5:45 AM      Component Value Range Comment   WBC 16.0 (*) 4.0 - 10.5 (K/uL)    RBC 2.36 (*) 4.22 - 5.81 (MIL/uL)    Hemoglobin 7.2  (*) 13.0 - 17.0 (g/dL)    HCT 45.4 (*) 09.8 - 52.0 (%)    MCV 91.1  78.0 - 100.0 (fL)    MCH 30.5  26.0 - 34.0 (pg)    MCHC 33.5  30.0 - 36.0 (g/dL)    RDW 11.9 (*) 14.7 - 15.5 (%)    Platelets 378  150 - 400 (K/uL)   PREPARE RBC (CROSSMATCH)     Status: Normal   Collection Time   07/07/11 11:00 AM      Component Value Range Comment   Order Confirmation ORDER PROCESSED BY BLOOD BANK     TYPE AND SCREEN     Status: Normal   Collection Time   07/07/11 12:16 PM      Component Value Range Comment   ABO/RH(D) O POS      Antibody Screen NEG      Sample Expiration 07/10/2011      Unit Number [id number]      Blood Component Type RBC LR PHER2      Unit division 00      Status of Unit ISSUED,FINAL      Donor AG Type        Value: NEGATIVE FOR C ANTIGEN NEGATIVE FOR E ANTIGEN NEGATIVE FOR KELL ANTIGEN   Transfusion Status OK TO TRANSFUSE      Crossmatch Result COMPATIBLE     COMPREHENSIVE METABOLIC PANEL     Status: Abnormal   Collection Time   07/08/11  7:00 AM      Component Value Range Comment   Sodium 138  135 - 145 (mEq/L)    Potassium 4.0  3.5 - 5.1 (mEq/L)    Chloride 104  96 - 112 (mEq/L)    CO2 24  19 - 32 (mEq/L)    Glucose, Bld 102 (*) 70 - 99 (mg/dL)    BUN 6  6 - 23 (mg/dL)    Creatinine, Ser 8.65  0.50 - 1.35 (mg/dL)    Calcium 8.6  8.4 - 10.5 (mg/dL)    Total Protein 6.8  6.0 - 8.3 (g/dL)    Albumin 3.7  3.5 - 5.2 (g/dL)    AST 48 (*) 0 - 37 (U/L)    ALT 63 (*) 0 - 53 (U/L)    Alkaline Phosphatase 112  39 - 117 (U/L)    Total Bilirubin 2.5 (*) 0.3 - 1.2 (mg/dL)    GFR calc non Af Amer >90  >90 (mL/min)    GFR calc Af Amer >90  >90 (mL/min)   CBC     Status: Abnormal   Collection Time   07/08/11  7:00 AM      Component Value Range Comment   WBC 13.7 (*) 4.0 - 10.5 (K/uL)    RBC 2.58 (*) 4.22 - 5.81 (MIL/uL)    Hemoglobin 7.7 (*) 13.0 - 17.0 (g/dL)    HCT 16.1 (*) 09.6 - 52.0 (%)    MCV 89.1  78.0 - 100.0 (fL)    MCH 29.8  26.0 - 34.0 (pg)    MCHC 33.5  30.0 - 36.0  (g/dL)    RDW 04.5 (*) 40.9 - 15.5 (%)    Platelets 379  150 - 400 (K/uL)     Studies/Results: Dg Chest 2 View  07/07/2011  *RADIOLOGY REPORT*  Clinical Data: 31 year old male with left side chest pain.  History of sickle cell disease.  CHEST - 2 VIEW  Comparison: 06/22/2011 and earlier.  Findings: Right PICC line in place. Stable cardiomegaly and mediastinal contours.  Visualized tracheal air column is within normal limits.  Chronic increased interstitial markings are stable. No pneumothorax, overt edema, pleural effusion or acute pulmonary opacity. No acute osseous abnormality identified.  IMPRESSION: No acute cardiopulmonary abnormality.  Original Report Authenticated By: Harley Hallmark, M.D.    Patient Active Problem List  Diagnoses  . Sickle cell pain crisis  . Leukocytosis  . Chronic pain  . Hypokalemia  . Metabolic acidosis  . Thrombocytosis  . Hemochromatosis  . Hypomagnesemia  . Acute chest syndrome due to sickle cell crisis  . Diarrhea  . Elevated brain natriuretic peptide (BNP) level  . Bronchitis  . Sickle cell anemia  . Acute embolism and thrombosis of right internal jugular vein  . Avascular necrosis  . Metabolic encephalopathy    Impression: Slowly resolving sickle cell crisis. Sickle cell lung disease, acute chest syndrome improved. Chest x-ray unremarkable. Right ankle pain secondary to sacral osteotomy. History of avascular necrosis.   Plan: Continue therapy. Nebulizer therapy for bronchospasm. Anticipate discharge home within the next 24-48 hours.   [name], Tyeson Tanimoto 07/08/2011 8:19 PM

## 2011-07-09 DIAGNOSIS — D5700 Hb-SS disease with crisis, unspecified: Secondary | ICD-10-CM | POA: Diagnosis not present

## 2011-07-09 DIAGNOSIS — R0789 Other chest pain: Secondary | ICD-10-CM | POA: Diagnosis not present

## 2011-07-09 DIAGNOSIS — D5701 Hb-SS disease with acute chest syndrome: Secondary | ICD-10-CM | POA: Diagnosis not present

## 2011-07-09 DIAGNOSIS — M2550 Pain in unspecified joint: Secondary | ICD-10-CM | POA: Diagnosis not present

## 2011-07-09 DIAGNOSIS — E876 Hypokalemia: Secondary | ICD-10-CM | POA: Diagnosis not present

## 2011-07-09 DIAGNOSIS — M25579 Pain in unspecified ankle and joints of unspecified foot: Secondary | ICD-10-CM | POA: Diagnosis not present

## 2011-07-09 DIAGNOSIS — D72829 Elevated white blood cell count, unspecified: Secondary | ICD-10-CM | POA: Diagnosis not present

## 2011-07-09 LAB — COMPREHENSIVE METABOLIC PANEL
ALT: 76 U/L — ABNORMAL HIGH (ref 0–53)
AST: 61 U/L — ABNORMAL HIGH (ref 0–37)
Albumin: 3.7 g/dL (ref 3.5–5.2)
Calcium: 9 mg/dL (ref 8.4–10.5)
GFR calc Af Amer: >90 mL/min (ref >90)
Glucose, Bld: 106 mg/dL — ABNORMAL HIGH (ref 70–99)
Sodium: 137 mEq/L (ref 135–145)
Total Protein: 7 g/dL (ref 6.0–8.3)

## 2011-07-09 LAB — CBC
HCT: 23.6 % — ABNORMAL LOW (ref 39.0–52.0)
Hemoglobin: 7.9 g/dL — ABNORMAL LOW (ref 13.0–17.0)
MCHC: 33.5 g/dL (ref 30.0–36.0)

## 2011-07-09 NOTE — Progress Notes (Signed)
Subjective:  No new complaints. Decreasing chest   Objective:  Vital Signs in the last 24 hours: Temp:  [98.3 F (36.8 C)-99.4 F (37.4 C)] 98.4 F (36.9 C) (06/08 1023) Pulse Rate:  [79-88] 81  (06/08 1023) Cardiac Rhythm:  [-]  Resp:  [18-20] 18  (06/08 1023) BP: (116-143)/(68-84) 143/83 mmHg (06/08 1023) SpO2:  [97 %-100 %] 100 % (06/08 1023) Weight:  [84.913 kg (187 lb 3.2 oz)] 84.913 kg (187 lb 3.2 oz) (06/08 0645)  Physical Exam: BP Readings from Last 1 Encounters:  07/09/11 143/83    Wt Readings from Last 1 Encounters:  07/09/11 84.913 kg (187 lb 3.2 oz)    Weight change: 0.363 kg (12.8 oz)  HEENT: [place]/AT, Eyes-Brown, PERL, EOMI, Conjunctiva-Pale, Sclera-icteric Neck: No JVD, No bruit, Trachea midline. Lungs:  Clear, Bilateral. Cardiac:  Regular rhythm, normal S1 and S2, no S3.  Abdomen:  Soft, non-tender. Extremities:  No edema present. No cyanosis. No clubbing. CNS: AxOx3, Cranial nerves grossly intact, moves all 4 extremities. Right handed. Skin: Warm and dry.   Intake/Output from previous day: 06/07 0701 - 06/08 0700 In: 1560 [P.O.:960; I.V.:600] Out: -     Lab Results: BMET    Component Value Date/Time   NA 137 07/09/2011 0356   K 4.0 07/09/2011 0356   CL 103 07/09/2011 0356   CO2 25 07/09/2011 0356   GLUCOSE 106* 07/09/2011 0356   BUN 5* 07/09/2011 0356   CREATININE 0.63 07/09/2011 0356   CALCIUM 9.0 07/09/2011 0356   GFRNONAA >90 07/09/2011 0356   GFRAA >90 07/09/2011 0356   CBC    Component Value Date/Time   WBC 15.9* 07/09/2011 0356   RBC 2.62* 07/09/2011 0356   HGB 7.9* 07/09/2011 0356   HCT 23.6* 07/09/2011 0356   PLT 409* 07/09/2011 0356   MCV 90.1 07/09/2011 0356   MCH 30.2 07/09/2011 0356   MCHC 33.5 07/09/2011 0356   RDW 20.1* 07/09/2011 0356   LYMPHSABS 2.6 06/30/2011 0925   MONOABS 2.4* 06/30/2011 0925   EOSABS 0.4 06/30/2011 0925   BASOSABS 0.2* 06/30/2011 0925   CARDIAC ENZYMES Lab Results  Component Value Date   CKTOTAL 136 04/23/2011   CKMB 1.5 04/23/2011   TROPONINI <0.30 04/23/2011    Assessment/Plan:  Patient Active Hospital Problem List: Sickle cell pain crisis () Leukocytosis (12/17/2010) Hemochromatosis Avascular necrosis  Continue medical treatment.   LOS: 9 days    Orpah Cobb  MD  07/09/2011, 11:56 AM

## 2011-07-10 DIAGNOSIS — M2550 Pain in unspecified joint: Secondary | ICD-10-CM | POA: Diagnosis not present

## 2011-07-10 DIAGNOSIS — D72829 Elevated white blood cell count, unspecified: Secondary | ICD-10-CM | POA: Diagnosis not present

## 2011-07-10 DIAGNOSIS — R0789 Other chest pain: Secondary | ICD-10-CM | POA: Diagnosis not present

## 2011-07-10 DIAGNOSIS — E876 Hypokalemia: Secondary | ICD-10-CM | POA: Diagnosis not present

## 2011-07-10 DIAGNOSIS — D5700 Hb-SS disease with crisis, unspecified: Secondary | ICD-10-CM | POA: Diagnosis not present

## 2011-07-10 DIAGNOSIS — D5701 Hb-SS disease with acute chest syndrome: Secondary | ICD-10-CM | POA: Diagnosis not present

## 2011-07-10 DIAGNOSIS — M25579 Pain in unspecified ankle and joints of unspecified foot: Secondary | ICD-10-CM | POA: Diagnosis not present

## 2011-07-10 LAB — COMPREHENSIVE METABOLIC PANEL
Alkaline Phosphatase: 142 U/L — ABNORMAL HIGH (ref 39–117)
BUN: 5 mg/dL — ABNORMAL LOW (ref 6–23)
GFR calc Af Amer: >90 mL/min (ref >90)
Glucose, Bld: 114 mg/dL — ABNORMAL HIGH (ref 70–99)
Potassium: 4 mEq/L (ref 3.5–5.1)
Total Bilirubin: 2.5 mg/dL — ABNORMAL HIGH (ref 0.3–1.2)
Total Protein: 7.5 g/dL (ref 6.0–8.3)

## 2011-07-10 LAB — CBC
Platelets: 430 10*3/uL — ABNORMAL HIGH (ref 150–400)
RDW: 19.7 % — ABNORMAL HIGH (ref 11.5–15.5)
WBC: 16.2 10*3/uL — ABNORMAL HIGH (ref 4.0–10.5)

## 2011-07-10 NOTE — Progress Notes (Signed)
Subjective:  Improving chest pain.  Objective:  Vital Signs in the last 24 hours: Temp:  [97.9 F (36.6 C)-99 F (37.2 C)] 98.5 F (36.9 C) (06/09 0942) Pulse Rate:  [76-93] 84  (06/09 0942) Cardiac Rhythm:  [-]  Resp:  [16-18] 18  (06/09 0942) BP: (106-122)/(65-75) 106/65 mmHg (06/09 0942) SpO2:  [97 %-100 %] 98 % (06/09 0942) Weight:  [83.643 kg (184 lb 6.4 oz)] 83.643 kg (184 lb 6.4 oz) (06/09 0514)  Physical Exam: BP Readings from Last 1 Encounters:  07/10/11 106/65    Wt Readings from Last 1 Encounters:  07/10/11 83.643 kg (184 lb 6.4 oz)    Weight change: -1.27 kg (-2 lb 12.8 oz)  HEENT: [place]/AT, Eyes-Brown, PERL, EOMI, Conjunctiva-Pale, Sclera-icteric Neck: No JVD, No bruit, Trachea midline. Lungs:  Clear, Bilateral. Cardiac:  Regular rhythm, normal S1 and S2, no S3.  Abdomen:  Soft, non-tender. Extremities:  No edema present. No cyanosis. No clubbing. CNS: AxOx3, Cranial nerves grossly intact, moves all 4 extremities. Right handed. Skin: Warm and dry.   Intake/Output from previous day: 06/08 0701 - 06/09 0700 In: 3305 [P.O.:480; I.V.:2825] Out: -     Lab Results: BMET    Component Value Date/Time   NA 135 07/10/2011 0410   K 4.0 07/10/2011 0410   CL 101 07/10/2011 0410   CO2 24 07/10/2011 0410   GLUCOSE 114* 07/10/2011 0410   BUN 5* 07/10/2011 0410   CREATININE 0.64 07/10/2011 0410   CALCIUM 8.8 07/10/2011 0410   GFRNONAA >90 07/10/2011 0410   GFRAA >90 07/10/2011 0410   CBC    Component Value Date/Time   WBC 16.2* 07/10/2011 0410   RBC 2.84* 07/10/2011 0410   HGB 8.6* 07/10/2011 0410   HCT 25.4* 07/10/2011 0410   PLT 430* 07/10/2011 0410   MCV 89.4 07/10/2011 0410   MCH 30.3 07/10/2011 0410   MCHC 33.9 07/10/2011 0410   RDW 19.7* 07/10/2011 0410   LYMPHSABS 2.6 06/30/2011 0925   MONOABS 2.4* 06/30/2011 0925   EOSABS 0.4 06/30/2011 0925   BASOSABS 0.2* 06/30/2011 0925   CARDIAC ENZYMES Lab Results  Component Value Date   CKTOTAL 136 04/23/2011   CKMB 1.5 04/23/2011   TROPONINI  <0.30 04/23/2011    Assessment/Plan:  Patient Active Hospital Problem List: Sickle cell pain crisis () Leukocytosis (12/17/2010) Hemochromatosis  Avascular necrosis   Continue medical treatment.     LOS: 10 days    Orpah Cobb  MD  07/10/2011, 12:25 PM

## 2011-07-11 DIAGNOSIS — R0789 Other chest pain: Secondary | ICD-10-CM | POA: Diagnosis not present

## 2011-07-11 DIAGNOSIS — D72829 Elevated white blood cell count, unspecified: Secondary | ICD-10-CM | POA: Diagnosis not present

## 2011-07-11 DIAGNOSIS — M25579 Pain in unspecified ankle and joints of unspecified foot: Secondary | ICD-10-CM | POA: Diagnosis not present

## 2011-07-11 DIAGNOSIS — D5701 Hb-SS disease with acute chest syndrome: Secondary | ICD-10-CM | POA: Diagnosis not present

## 2011-07-11 DIAGNOSIS — E876 Hypokalemia: Secondary | ICD-10-CM | POA: Diagnosis not present

## 2011-07-11 DIAGNOSIS — D5700 Hb-SS disease with crisis, unspecified: Secondary | ICD-10-CM | POA: Diagnosis not present

## 2011-07-11 LAB — COMPREHENSIVE METABOLIC PANEL
AST: 59 U/L — ABNORMAL HIGH (ref 0–37)
Albumin: 3.8 g/dL (ref 3.5–5.2)
Alkaline Phosphatase: 144 U/L — ABNORMAL HIGH (ref 39–117)
Chloride: 102 mEq/L (ref 96–112)
Potassium: 4.3 mEq/L (ref 3.5–5.1)
Sodium: 136 mEq/L (ref 135–145)
Total Bilirubin: 2.4 mg/dL — ABNORMAL HIGH (ref 0.3–1.2)

## 2011-07-11 LAB — CBC
MCH: 29.4 pg (ref 26.0–34.0)
MCV: 90.1 fL (ref 78.0–100.0)
Platelets: 404 10*3/uL — ABNORMAL HIGH (ref 150–400)
RDW: 19.7 % — ABNORMAL HIGH (ref 11.5–15.5)

## 2011-07-11 MED ORDER — OXYCODONE HCL 80 MG PO TB12: 80.0000 mg | ORAL_TABLET | Freq: Two times a day (BID) | ORAL | Status: DC

## 2011-07-11 MED ORDER — OLANZAPINE 10 MG PO TABS: 10.0000 mg | ORAL_TABLET | Freq: Every day | ORAL | Status: DC

## 2011-07-11 MED ORDER — HYDROMORPHONE HCL 4 MG PO TABS: 4.0000 mg | ORAL_TABLET | ORAL | Status: DC | PRN

## 2011-07-11 MED ORDER — ZOLPIDEM TARTRATE 10 MG PO TABS: 10.0000 mg | ORAL_TABLET | Freq: Every evening | ORAL | Status: DC | PRN

## 2011-07-11 NOTE — Discharge Summary (Signed)
[facility] Discharge Summary  Patient ID: [name] MRN: [id number] DOB/AGE: [birth year] 31 y.o.  Admit date: 06/30/2011 Discharge date: 07/11/2011  Admission Diagnoses: Sickle cell crisis Leukocytosis  Discharge Diagnoses:  Sickle cell pain crisis Leukocytosis  Discharged Condition: Stable  Hospital Course:  In short, [name] is a 31 year-old African-American male, with history of sickle cell disease.  [name] was admitted to the sickle cell center for observation on 06/30/11 with acute hemolytic/painful sickle cell crisis. His main complaint was pain in the lower chest/upper abdomen that was not relieved by his home pain meds. The patient received a blood transfusion, but continued to complain of pain.  Upon admission the patient received aggressive pain/nausea/pruritis/bowel management, IV hydration, PICC line insertion, blood exchange/transfusion and antibiotic therapy (Avelox for 10 days).  The patient states that he is ready to manage his pain at home and is requesting discharge.  The patient will be discharge to home at this time in stable condition.  Consults: None  Significant Diagnostic Studies:  Dg Chest 2 View 07/07/2011  *RADIOLOGY REPORT*  Clinical Data: 31 year old male with left side chest pain.  History of sickle cell disease.  CHEST - 2 VIEW  Comparison: 06/22/2011 and earlier.  Findings: Right PICC line in place. Stable cardiomegaly and mediastinal contours.  Visualized tracheal air column is within normal limits.  Chronic increased interstitial markings are stable. No pneumothorax, overt edema, pleural effusion or acute pulmonary opacity. No acute osseous abnormality identified.  IMPRESSION: No acute cardiopulmonary abnormality.  Original Report Authenticated By: Harley Hallmark, M.D.   Ir Fluoro Guide Cv Line Right 06/30/2011  *RADIOLOGY REPORT*  Clinical Data: Sickle Cell crisis in need of IV access  PICC LINE PLACEMENT WITH ULTRASOUND  AND FLUOROSCOPIC  GUIDANCE  Fluoroscopy Time: 0.1 minutes.  The right arm was prepped with chlorhexidine, draped in the usual sterile fashion using maximum barrier technique (cap and mask, sterile gown, sterile gloves, large sterile sheet, hand hygiene and cutaneous antisepsis) and infiltrated locally with 1% Lidocaine.  Ultrasound demonstrated patency of the right brachial vein, and this was documented with an image.  Under real-time ultrasound guidance, this vein was accessed with a 21 gauge micropuncture needle and image documentation was performed.  The needle was exchanged over a guidewire for a peel-away sheath through which a 5 [place] dual lumen PICC trimmed to 42 cm was advanced, positioned with its tip at the lower SVC/right atrial junction.  Fluoroscopy during the procedure and fluoro spot radiograph confirms appropriate catheter position.  The catheter was flushed, secured to the skin with Prolene sutures, and covered with a sterile dressing.  Complications:  None immediate  IMPRESSION: Successful right arm PICC line placement with ultrasound and fluoroscopic guidance.  The catheter is ready for use.  Read by: Anselm Pancoast, P.A.-C  Original Report Authenticated By: Judie Petit. Ruel Favors, M.D.   Treatments: IV hydration, antibiotics: Avelox, analgesia: Dilaudid, anticoagulation: Lovenox, procedures: PICC line and Blood transfusion/exchange, Oxygen and nebulizer therapy  Discharge Exam: Blood pressure 121/70, pulse 86, temperature 98.7 F (37.1 C), temperature source Oral, resp. rate 16, height 6' (1.829 m), weight 185 lb 4.8 oz (84.052 kg), SpO2 100.00%.  General Appearance: Alert and oriented, cooperative, well developed, well nourished, no apparent distress Head: Normocephalic, without obvious abnormality, atraumatic Eyes: PERRLA, EOMI, the patient has slight strabismus, scleral icterus Nose: Nares normal, septum midline, mucosa normal, no drainage or sinus tenderness. Throat:  Normal lips,  mucosa, tongue and gums Back: Symmetric,  bilateral CVA/flank tenderness, mild diffuse back tenderness Resp: CTA, Diminished bibasilar breath sounds, no wheezes/rales/rhonchi Cardio: S1, S2 regular, no murmurs/clicks/rubs/gallops GI: Soft, non-tender, normoactive bowel sounds, no masses, no organomegaly Male Genitalia: Deferred, the patient denies priapism Extremities: Extremities normal, atraumatic, no cyanosis or edema, negative Homans sign, no sign of DVT, no tenderness Pulses: 2+ and symmetric Skin: Skin color, texture, turgor normal, no rashes or lesions, numerous tattoos Neurologic: Grossly normal, CN II-XII intact, no focal deficits Psych:  Appropriate affect  Disposition: 01-Home or Self Care  Discharge Orders    Future Orders Please Complete By Expires   Increase activity slowly      Discharge instructions      Comments:   Take all medications as prescribed Keep all follow-up appointments Continue to use incentive spirometer daily Rest Drink plenty of fluids Keep warm    Call MD for:  severe uncontrolled pain        Medication List  As of 07/11/2011 10:20 AM   TAKE these medications         folic acid 1 MG tablet   Commonly known as: FOLVITE   Take 1 tablet (1 mg total) by mouth daily.      HYDROmorphone 4 MG tablet   Commonly known as: DILAUDID   Take 1 tablet (4 mg total) by mouth every 4 (four) hours as needed for pain. For pain      hydroxyurea 500 MG capsule   Commonly known as: HYDREA   Take 500 mg by mouth 2 (two) times daily. May take with food to minimize GI side effects.      ibuprofen 800 MG tablet   Commonly known as: ADVIL,MOTRIN   Take 1 tablet (800 mg total) by mouth every 8 (eight) hours as needed. For pain.      levalbuterol 0.63 MG/3ML nebulizer solution   Commonly known as: XOPENEX   Take 3 mLs (0.63 mg total) by nebulization every 4 (four) hours as needed for wheezing or shortness of breath (QS x 1 month).      OLANZapine 10 MG tablet    Commonly known as: ZYPREXA   Take 1 tablet (10 mg total) by mouth at bedtime.      oxyCODONE 80 MG 12 hr tablet   Commonly known as: OXYCONTIN   Take 1 tablet (80 mg total) by mouth every 12 (twelve) hours. pain      potassium chloride SA 20 MEQ tablet   Commonly known as: K-DUR,KLOR-CON   Take 1 tablet (20 mEq total) by mouth daily.      zolpidem 10 MG tablet   Commonly known as: AMBIEN   Take 1 tablet (10 mg total) by mouth at bedtime as needed. For sleep.           Follow-up Information    Follow up with [name], [name], MD. Schedule an appointment as soon as possible for a visit in 1 week. ( Go to [facility] if symptoms worsen)    Contact information:   [address] [facility] [place] [address] [phone]       Follow up with [name], MD. Schedule an appointment as soon as possible for a visit in 1 month.   Contact information:   [address] [place] [address] [phone]         Discharge Time:  Greater than 30 minutes  Signed: Larina Bras NP-C 07/11/2011, 10:20 AM

## 2011-07-11 NOTE — Progress Notes (Signed)
PICC dc'd per order for discharge - pressure to site x3 minutes - instructed to keep dsg on x 24 hours then can remove and replace with Bandaid as needed. VSandritter RN/VABC

## 2011-07-11 NOTE — Progress Notes (Signed)
Pt is D/C home, pt  denies pain and is stable . D/C instructions done, medication instructions done. Pt verbalizes  Understanding.

## 2011-07-12 DIAGNOSIS — D5700 Hb-SS disease with crisis, unspecified: Secondary | ICD-10-CM | POA: Diagnosis not present

## 2011-07-13 DIAGNOSIS — D5700 Hb-SS disease with crisis, unspecified: Secondary | ICD-10-CM | POA: Diagnosis not present

## 2011-07-13 NOTE — H&P (Signed)
Patient ID: [name], male   DOB: 01-12-1981, 31 y.o.   MRN: [id number]  Chief Complaint  Patient presents with  . Sickle Cell Pain Crisis    HPI [name] is a 31 y.o. male with sickle cell disease who was transferred.  From the emergency room to the sickle cell unit for continuing care. He had been doing well up until the past couple days when he noted increasing chest pain mainly in her around the rib cage bilaterally. This associated with increasing aching pain as well. There is no fever chills or night sweats. No significant cough. He had run out of his short acting medication. His pain became severe and subsequently presented to the emergency room for further treatment.   Past Medical History  Diagnosis Date  . Sickle cell anemia   . Blood transfusion   . 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 Hypokalemia   . Mood disorder   . Pulmonary embolism   . Avascular necrosis   . Leukocytosis     Chronic  . Thrombocytosis     Chronic    Past Surgical History  Procedure Date  . Right hip replacement     08/2006  . Cholecystectomy     01/2008  . Porta cath placement   . Porta cath removal   . Umbilical hernia repair     01/2008  . Excision of left periauricular cyst     10/2009  . Excision of right ear lobe cyst with primary closur     11/2007    Family History  Problem Relation Age of Onset  . Sickle cell anemia Mother   . Sickle cell anemia Father   . Sickle cell trait Brother     Social History History  Substance Use Topics  . Smoking status: Former Smoker -- 13 years    Types: Cigarettes    Quit date: 07/08/2010  . Smokeless tobacco: Never Used  . Alcohol Use: No    Allergies  Allergen Reactions  . Morphine And Related Hives and Rash    Pt states he is able to tolerate Dilaudid with no reactions.    No current facility-administered medications for this encounter.   Current Outpatient Prescriptions  Medication  Sig Dispense Refill  . folic acid (FOLVITE) 1 MG tablet Take 1 tablet (1 mg total) by mouth daily.  60 tablet  2  . HYDROmorphone (DILAUDID) 4 MG tablet Take 1 tablet (4 mg total) by mouth every 4 (four) hours as needed for pain. For pain  60 tablet  0  . hydroxyurea (HYDREA) 500 MG capsule Take 500 mg by mouth 2 (two) times daily. May take with food to minimize GI side effects.      Marland Kitchen ibuprofen (ADVIL,MOTRIN) 800 MG tablet Take 1 tablet (800 mg total) by mouth every 8 (eight) hours as needed. For pain.  90 tablet  3  . levalbuterol (XOPENEX) 0.63 MG/3ML nebulizer solution Take 3 mLs (0.63 mg total) by nebulization every 4 (four) hours as needed for wheezing or shortness of breath (QS x 1 month).  3 mL  12  . OLANZapine (ZYPREXA) 10 MG tablet Take 1 tablet (10 mg total) by mouth at bedtime.  30 tablet  0  . oxyCODONE (OXYCONTIN) 80 MG 12 hr tablet Take 1 tablet (80 mg total) by mouth every 12 (twelve) hours. pain  60 tablet  0  . potassium chloride SA (K-DUR,KLOR-CON) 20 MEQ tablet Take 1  tablet (20 mEq total) by mouth daily.  30 tablet  1  . zolpidem (AMBIEN) 10 MG tablet Take 1 tablet (10 mg total) by mouth at bedtime as needed. For sleep.  30 tablet  0    Review of Systems As noted above. Otherwise unremarkable.  Blood pressure 105/60, pulse 68, temperature 98.7 F (37.1 C), temperature source Oral, resp. rate 16, SpO2 100.00%.  Physical Exam Well-developed well-nourished black male in moderate distress. HEENT: no sinus tenderness. Mild scleral icterus. Posterior pharynx clear. NECK: No enlarged thyroid. No posterior cervical nodes. LUNGS: Clear to auscultation. Decreased breath sounds at bases. Bilateral costochondral tenderness. CV: Normal S1, S2 without S3. No murmurs or rubs. ABDOMEN: Bowel sounds are present. No masses or tenderness. MSK: Negative Homans. No edema. Minimal left shoulder tenderness. NEURO: Intact.  Data Reviewed   Laboratory data review from the emergency  room.  Assessment    Sickle cell crisis. Uncontrolled pain. Sickle cell lung disease with history of acute chest syndrome. Mood disorder stable. Avascular necrosis of left shoulder.    Plan    Admit to the sickle cell unit. IV fluids with half-normal saline at 125 cc an hour. Dilaudid  2-4 mg every 2 hours when necessary. Reevaluate after treatment.       [name], Diago Haik 05/17/2011 9:10 AM

## 2011-07-13 NOTE — Discharge Summary (Signed)
[facility] Discharge Summary  Patient ID: [name] MRN: [id number] DOB/AGE: [birth year] 31 y.o.  Admit date: 05/17/2011 Discharge date: 05/17/2011  Admission Diagnoses: Sickle cell crisis Sickle cell lung disease. Musculoskeletal chest pain. Mood disorder stable  Discharge Diagnoses:  Sickle cell crisis Chest wall pain Sickle cell lung disease  Discharged Condition: improved  Clinic Course: patient was treated with IV fluids of half-normal saline with 20 of KCl per liter at 125 cc an hour. He was given Dilaudid 4 mg q.2 hours as needed for pain. He was maintained on IV Zofran and Benadryl as needed for nausea and pruritus respectively. After 23 hours of therapy patient felt much better. He felt that he could manage his pain regimen at home. He was given a prescription for his short acting pain medicine as he a run out of this prior to coming into the sickle cell unit. He was encouraged to followup in one week's time for regular care in the medicine clinic.   Discharge Vital Signs: Blood pressure 105/60, pulse 68, temperature 98.7 F (37.1 C), temperature source Oral, resp. rate 16, SpO2 100.00%.   Disposition: 01-Home or Self Care Return to office in one week's time.  Discharge Orders    Future Orders Please Complete By Expires   Diet - low sodium heart healthy      Increase activity slowly      No wound care      Call MD for:  severe uncontrolled pain        MEDICATIONS: Folic acid 1 mg daily Hydroxyurea 500 mg, 2 tablets daily Potassium chloride 20 mg daily Hydromorphone 4 mg q.4 hours p.r.n. Pain Oxycodone 80 mg q.12 hours Ibuprofen 800 mg t.i.d. Zyprexa 10 mg q.h.s.   SignedAugust [name], Eros Montour 05/17/2011, 08:00 PM

## 2011-07-14 ENCOUNTER — Telehealth (HOSPITAL_COMMUNITY): Payer: Self-pay | Admitting: *Deleted

## 2011-07-14 DIAGNOSIS — D5700 Hb-SS disease with crisis, unspecified: Secondary | ICD-10-CM | POA: Diagnosis not present

## 2011-07-15 DIAGNOSIS — D5700 Hb-SS disease with crisis, unspecified: Secondary | ICD-10-CM | POA: Diagnosis not present

## 2011-07-18 ENCOUNTER — Encounter (HOSPITAL_COMMUNITY): Payer: Self-pay | Admitting: *Deleted

## 2011-07-18 ENCOUNTER — Inpatient Hospital Stay (HOSPITAL_COMMUNITY)
Admission: AD | Admit: 2011-07-18 | Discharge: 2011-07-23 | DRG: 811 | Disposition: A | Discharge status: Home / Self Care | Payer: Medicare Other | Source: Ambulatory Visit | Attending: Internal Medicine | Admitting: Internal Medicine

## 2011-07-18 DIAGNOSIS — K7290 Hepatic failure, unspecified without coma: Secondary | ICD-10-CM | POA: Diagnosis present

## 2011-07-18 DIAGNOSIS — F39 Unspecified mood [affective] disorder: Secondary | ICD-10-CM | POA: Diagnosis present

## 2011-07-18 DIAGNOSIS — D5701 Hb-SS disease with acute chest syndrome: Secondary | ICD-10-CM | POA: Diagnosis present

## 2011-07-18 DIAGNOSIS — D5700 Hb-SS disease with crisis, unspecified: Principal | ICD-10-CM | POA: Diagnosis present

## 2011-07-18 DIAGNOSIS — Z96649 Presence of unspecified artificial hip joint: Secondary | ICD-10-CM

## 2011-07-18 DIAGNOSIS — K7682 Hepatic encephalopathy: Secondary | ICD-10-CM | POA: Diagnosis present

## 2011-07-18 DIAGNOSIS — R7989 Other specified abnormal findings of blood chemistry: Secondary | ICD-10-CM

## 2011-07-18 LAB — MRSA PCR SCREENING: MRSA by PCR: NEGATIVE

## 2011-07-18 LAB — DIFFERENTIAL
Basophils Relative: 1 % (ref 0–1)
Eosinophils Relative: 2 % (ref 0–5)
Monocytes Absolute: 1.9 10*3/uL — ABNORMAL HIGH (ref 0.1–1.0)
Monocytes Relative: 10 % (ref 3–12)
Neutrophils Relative %: 73 % (ref 43–77)

## 2011-07-18 LAB — CBC
Hemoglobin: 9.2 g/dL — ABNORMAL LOW (ref 13.0–17.0)
MCH: 31.1 pg (ref 26.0–34.0)
RBC: 2.96 MIL/uL — ABNORMAL LOW (ref 4.22–5.81)
WBC: 18.5 10*3/uL — ABNORMAL HIGH (ref 4.0–10.5)

## 2011-07-18 LAB — COMPREHENSIVE METABOLIC PANEL
AST: 20 U/L (ref 0–37)
Albumin: 4.1 g/dL (ref 3.5–5.2)
BUN: 7 mg/dL (ref 6–23)
CO2: 23 mEq/L (ref 19–32)
Calcium: 9.2 mg/dL (ref 8.4–10.5)
Creatinine, Ser: 0.62 mg/dL (ref 0.50–1.35)
GFR calc non Af Amer: >90 mL/min (ref >90)

## 2011-07-18 LAB — RETICULOCYTES: RBC.: 2.96 MIL/uL — ABNORMAL LOW (ref 4.22–5.81)

## 2011-07-18 MED ORDER — SODIUM CHLORIDE 0.9 % IJ SOLN
10.0000 mL | INTRAMUSCULAR | Status: DC | PRN
  Administered 2011-07-21: 70 mL
  Administered 2011-07-22: 10 mL

## 2011-07-18 MED ORDER — DIPHENHYDRAMINE HCL 50 MG/ML IJ SOLN
12.5000 mg | INTRAMUSCULAR | Status: DC | PRN
  Administered 2011-07-18 – 2011-07-19 (×5): 12.5 mg via INTRAVENOUS
  Administered 2011-07-20: 25 mg via INTRAVENOUS
  Administered 2011-07-20 (×4): 12.5 mg via INTRAVENOUS
  Administered 2011-07-21 – 2011-07-23 (×14): 25 mg via INTRAVENOUS
  Filled 2011-07-18 (×24): qty 1

## 2011-07-18 MED ORDER — OLANZAPINE 10 MG PO TABS
10.0000 mg | ORAL_TABLET | Freq: Every day | ORAL | Status: DC
  Administered 2011-07-19 – 2011-07-22 (×4): 10 mg via ORAL
  Filled 2011-07-18 (×5): qty 1

## 2011-07-18 MED ORDER — ENOXAPARIN SODIUM 40 MG/0.4ML ~~LOC~~ SOLN
40.0000 mg | SUBCUTANEOUS | Status: DC
  Administered 2011-07-18 – 2011-07-22 (×5): 40 mg via SUBCUTANEOUS
  Filled 2011-07-18 (×6): qty 0.4

## 2011-07-18 MED ORDER — HYDROMORPHONE HCL PF 2 MG/ML IJ SOLN
4.0000 mg | Freq: Once | INTRAMUSCULAR | Status: AC
  Administered 2011-07-18: 4 mg via INTRAMUSCULAR
  Filled 2011-07-18: qty 2

## 2011-07-18 MED ORDER — ONDANSETRON HCL 4 MG PO TABS
4.0000 mg | ORAL_TABLET | ORAL | Status: DC | PRN
  Administered 2011-07-18: 4 mg via ORAL
  Filled 2011-07-18: qty 1

## 2011-07-18 MED ORDER — ONDANSETRON HCL 4 MG/2ML IJ SOLN
4.0000 mg | INTRAMUSCULAR | Status: DC | PRN
  Administered 2011-07-18 – 2011-07-23 (×26): 4 mg via INTRAVENOUS
  Filled 2011-07-18 (×27): qty 2

## 2011-07-18 MED ORDER — FOLIC ACID 1 MG PO TABS: 1.0000 mg | ORAL_TABLET | Freq: Every day | ORAL | Status: DC

## 2011-07-18 MED ORDER — POTASSIUM CHLORIDE IN NACL 20-0.45 MEQ/L-% IV SOLN
INTRAVENOUS | Status: DC
  Administered 2011-07-19 – 2011-07-23 (×6): via INTRAVENOUS
  Filled 2011-07-18 (×10): qty 1000

## 2011-07-18 MED ORDER — HYDROMORPHONE HCL PF 2 MG/ML IJ SOLN
2.0000 mg | INTRAMUSCULAR | Status: DC | PRN
  Administered 2011-07-18 (×2): 2 mg via INTRAVENOUS
  Filled 2011-07-18 (×2): qty 1

## 2011-07-18 MED ORDER — POTASSIUM CHLORIDE CRYS ER 20 MEQ PO TBCR
20.0000 meq | EXTENDED_RELEASE_TABLET | Freq: Two times a day (BID) | ORAL | Status: DC
  Administered 2011-07-19 – 2011-07-21 (×6): 20 meq via ORAL
  Filled 2011-07-18 (×7): qty 1

## 2011-07-18 MED ORDER — SODIUM CHLORIDE 0.9 % IJ SOLN
10.0000 mL | Freq: Two times a day (BID) | INTRAMUSCULAR | Status: DC
  Administered 2011-07-18 – 2011-07-19 (×3): 10 mL
  Administered 2011-07-20: 30 mL
  Administered 2011-07-20 – 2011-07-22 (×4): 10 mL
  Administered 2011-07-22: 40 mL
  Administered 2011-07-23: 10 mL

## 2011-07-18 MED ORDER — HYDROMORPHONE HCL PF 4 MG/ML IJ SOLN
4.0000 mg | INTRAMUSCULAR | Status: DC | PRN
  Administered 2011-07-19 (×5): 4 mg via INTRAVENOUS
  Filled 2011-07-18 (×5): qty 1

## 2011-07-18 MED ORDER — DIPHENHYDRAMINE HCL 25 MG PO CAPS
25.0000 mg | ORAL_CAPSULE | ORAL | Status: DC | PRN
  Administered 2011-07-18: 25 mg via ORAL
  Filled 2011-07-18: qty 1

## 2011-07-18 MED ORDER — FOLIC ACID 1 MG PO TABS
1.0000 mg | ORAL_TABLET | Freq: Every day | ORAL | Status: DC
  Administered 2011-07-19 – 2011-07-23 (×5): 1 mg via ORAL
  Filled 2011-07-18 (×5): qty 1

## 2011-07-18 MED ORDER — ENOXAPARIN SODIUM 30 MG/0.3ML ~~LOC~~ SOLN: 30.0000 mg | SUBCUTANEOUS | Status: DC

## 2011-07-18 MED ORDER — DEXTROSE-NACL 5-0.45 % IV SOLN
INTRAVENOUS | Status: DC
  Administered 2011-07-18: 19:00:00 via INTRAVENOUS

## 2011-07-18 MED ORDER — DEXTROSE 5 % IV SOLN
500.0000 mg | Freq: Every day | INTRAVENOUS | Status: DC
  Administered 2011-07-19: 500 mg via INTRAVENOUS
  Filled 2011-07-18: qty 500

## 2011-07-18 MED ORDER — OXYCODONE HCL 40 MG PO TB12
40.0000 mg | ORAL_TABLET | Freq: Two times a day (BID) | ORAL | Status: DC
  Administered 2011-07-19 (×2): 40 mg via ORAL
  Filled 2011-07-18 (×2): qty 1

## 2011-07-18 NOTE — Progress Notes (Signed)
ANTICOAGULATION CONSULT NOTE - Initial Consult  Pharmacy Consult for Lovenox Indication: VTE prophylaxis  Allergies  Allergen Reactions  . Morphine And Related Hives and Rash    Pt states he is able to tolerate Dilaudid with no reactions.    Patient Measurements: Height: 6' (182.9 cm) Weight: 165 lb (74.844 kg) (stated weight) IBW/kg (Calculated) : 77.6   Labs:  Basename 07/18/11 1505  HGB 9.2*  HCT 27.1*  PLT 498*  APTT --  LABPROT --  INR --  HEPARINUNFRC --  CREATININE 0.62  CKTOTAL --  CKMB --  TROPONINI --    Estimated Creatinine Clearance: 140.3 ml/min (by C-G formula based on Cr of 0.62).  Assessment:  30 yo M with sickle cell disease to be admitted and requires VTE prophylaxis while inpatient  Pharmacy asked to dose Lovenox  Renal function normal with CrCl > 100 ml/min and platelets wnl  Plan:  Lovenox 40mg  SQ q24h Pharmacy will sign off  Lynann Beaver PharmD, BCPS Pager [phone] 07/18/2011 4:41 PM

## 2011-07-18 NOTE — Progress Notes (Signed)
Peripherally Inserted Central Catheter/Midline Placement  The IV Nurse has discussed with the patient and/or persons authorized to consent for the patient, the purpose of this procedure and the potential benefits and risks involved with this procedure.  The benefits include less needle sticks, lab draws from the catheter and patient may be discharged home with the catheter.  Risks include, but not limited to, infection, bleeding, blood clot (thrombus formation), and puncture of an artery; nerve damage and irregular heat beat.  Alternatives to this procedure were also discussed.  PICC/Midline Placement Documentation        [name] 07/18/2011, 6:39 PM

## 2011-07-18 NOTE — H&P (Signed)
[name] is an 31 y.o. male.   Chief Complaint: sickle cell crisis HPI:  This is one of multiple [place] admission was for this 31 year old single black male with sickle cell disease who [facility] to the [facility] complaining of diffuse pain. He had not been feeling well for the past 4 days. He has been experiencing increasing lower chest pain and back pain. He's had occasional cough nonproductive. He has been experiencing intermittent night sweats recently. He denies abdominal pain. Patient does not smoke or drink. This morning his pain was 8/10. He experienced increased weakness as well. He subsequently presented for further evaluation.  Past Medical History  Diagnosis Date  . Sickle cell anemia   . Blood transfusion   . 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 Hypokalemia   . Mood disorder   . Pulmonary embolism   . Avascular necrosis   . Leukocytosis     Chronic  . Thrombocytosis     Chronic    Past Surgical History  Procedure Date  . Right hip replacement     08/2006  . Cholecystectomy     01/2008  . Porta cath placement   . Porta cath removal   . Umbilical hernia repair     01/2008  . Excision of left periauricular cyst     10/2009  . Excision of right ear lobe cyst with primary closur     11/2007    Family History  Problem Relation Age of Onset  . Sickle cell anemia Mother   . Sickle cell anemia Father   . Sickle cell trait Brother    Social History:  reports that he quit smoking about a year ago. His smoking use included Cigarettes. He quit after 13 years of use. He has never used smokeless tobacco. He reports that he does not drink alcohol or use illicit drugs.  Allergies:  Allergies  Allergen Reactions  . Morphine And Related Hives and Rash    Pt states he is able to tolerate Dilaudid with no reactions.    Medications Prior to Admission  Medication Sig Dispense Refill  . folic acid (FOLVITE) 1  MG tablet Take 1 tablet (1 mg total) by mouth daily.  60 tablet  2  . HYDROmorphone (DILAUDID) 4 MG tablet Take 1 tablet (4 mg total) by mouth every 4 (four) hours as needed for pain. For pain  60 tablet  0  . hydroxyurea (HYDREA) 500 MG capsule Take 500 mg by mouth 2 (two) times daily. May take with food to minimize GI side effects.      Marland Kitchen ibuprofen (ADVIL,MOTRIN) 800 MG tablet Take 1 tablet (800 mg total) by mouth every 8 (eight) hours as needed. For pain.  90 tablet  3  . OLANZapine (ZYPREXA) 10 MG tablet Take 1 tablet (10 mg total) by mouth at bedtime.  30 tablet  0  . oxyCODONE (OXYCONTIN) 80 MG 12 hr tablet Take 1 tablet (80 mg total) by mouth every 12 (twelve) hours. pain  60 tablet  0  . potassium chloride SA (K-DUR,KLOR-CON) 20 MEQ tablet Take 1 tablet (20 mEq total) by mouth daily.  30 tablet  1  . zolpidem (AMBIEN) 10 MG tablet Take 1 tablet (10 mg total) by mouth at bedtime as needed. For sleep.  30 tablet  0    Results for orders placed during the hospital encounter of 07/18/11 (from the past 48 hour(s))  RETICULOCYTES  Status: Abnormal   Collection Time   07/18/11  3:00 PM      Component Value Range Comment   Retic Ct Pct 13.0 (*) 0.4 - 3.1 %    RBC. 2.96 (*) 4.22 - 5.81 MIL/uL    Retic Count, Manual 384.8 (*) 19.0 - 186.0 K/uL   PRO B NATRIURETIC PEPTIDE     Status: Normal   Collection Time   07/18/11  3:00 PM      Component Value Range Comment   Pro B Natriuretic peptide (BNP) 13.5  0 - 125 pg/mL   COMPREHENSIVE METABOLIC PANEL     Status: Abnormal   Collection Time   07/18/11  3:05 PM      Component Value Range Comment   Sodium 137  135 - 145 mEq/L    Potassium 3.3 (*) 3.5 - 5.1 mEq/L    Chloride 103  96 - 112 mEq/L    CO2 23  19 - 32 mEq/L    Glucose, Bld 101 (*) 70 - 99 mg/dL    BUN 7  6 - 23 mg/dL    Creatinine, Ser 4.01  0.50 - 1.35 mg/dL    Calcium 9.2  8.4 - 02.7 mg/dL    Total Protein 7.8  6.0 - 8.3 g/dL    Albumin 4.1  3.5 - 5.2 g/dL    AST 20  0 - 37 U/L     ALT 30  0 - 53 U/L    Alkaline Phosphatase 124 (*) 39 - 117 U/L    Total Bilirubin 3.3 (*) 0.3 - 1.2 mg/dL    GFR calc non Af Amer >90  >90 mL/min    GFR calc Af Amer >90  >90 mL/min   CBC     Status: Abnormal   Collection Time   07/18/11  3:05 PM      Component Value Range Comment   WBC 18.5 (*) 4.0 - 10.5 K/uL    RBC 2.96 (*) 4.22 - 5.81 MIL/uL    Hemoglobin 9.2 (*) 13.0 - 17.0 g/dL    HCT 25.3 (*) 66.4 - 52.0 %    MCV 91.6  78.0 - 100.0 fL    MCH 31.1  26.0 - 34.0 pg    MCHC 33.9  30.0 - 36.0 g/dL    RDW 40.3 (*) 47.4 - 15.5 %    Platelets 498 (*) 150 - 400 K/uL   DIFFERENTIAL     Status: Abnormal   Collection Time   07/18/11  3:05 PM      Component Value Range Comment   Neutrophils Relative 73  43 - 77 %    Lymphocytes Relative 14  12 - 46 %    Monocytes Relative 10  3 - 12 %    Eosinophils Relative 2  0 - 5 %    Basophils Relative 1  0 - 1 %    Neutro Abs 13.4 (*) 1.7 - 7.7 K/uL    Lymphs Abs 2.6  0.7 - 4.0 K/uL    Monocytes Absolute 1.9 (*) 0.1 - 1.0 K/uL    Eosinophils Absolute 0.4  0.0 - 0.7 K/uL    Basophils Absolute 0.2 (*) 0.0 - 0.1 K/uL    RBC Morphology SICKLE CELLS      No results found.  Review of systems as noted above. Blood pressure 130/80, pulse 84, temperature 98.8 F (37.1 C), temperature source Oral, resp. rate 18, height 6' (1.829 m), weight 165 lb (74.844 kg), SpO2 97.00%. Well-developed  ill-appearing black male in no acute distress. HEENT: Head normocephalic atraumatic. Positive scleral icterus. No sinus tenderness. TMs are clear. Posterior pharynx is clear. NECK: No enlarged thyroid. No posterior cervical nodes. LUNGS: Decreased breath sounds in the left base. No wheezes or rales appreciated. Mild vocal fremitus noted. Tenderness in the left CVA region. CV: Normal S1, S2 without S3. No murmurs or rubs. ABDOMEN: No masses. Tenderness along the lower costochondral region. MSK: No a.c. Joint tenderness. Negative Homans. No edema. NEURO:  Intact. Assessment/Plan Sickle cell crisis. Leukocytosis. Rule out secondary to #1 versus occult infection. Abnormal chest exam. Rule out infection versus early acute chest syndrome. Avascular necrosis. Mood disorder.  PLAN: IV fluids for hydration. Empiric antibiotics. IV Dilaudid for pain control. Incentive spirometry. Continue home medications. Baseline chest x-ray PA and lateral.  Averil Digman 07/18/2011, 7:51 PM

## 2011-07-18 NOTE — Progress Notes (Signed)
Mr Carlile aware that he is being admitted for insertion of PICC line, but may not occur till later this evening or tomorrow morning. Dr [name] aware and left decision to patient. Mr Defelice states will be admitted and wait for PICC insertion. Transferred to 1305 via wheelchair.

## 2011-07-19 ENCOUNTER — Inpatient Hospital Stay (HOSPITAL_COMMUNITY): Payer: Medicare Other

## 2011-07-19 DIAGNOSIS — D5700 Hb-SS disease with crisis, unspecified: Secondary | ICD-10-CM | POA: Diagnosis not present

## 2011-07-19 LAB — PREPARE RBC (CROSSMATCH)

## 2011-07-19 LAB — AMMONIA: Ammonia: 73 umol/L — ABNORMAL HIGH (ref 11–60)

## 2011-07-19 LAB — COMPREHENSIVE METABOLIC PANEL
Alkaline Phosphatase: 119 U/L — ABNORMAL HIGH (ref 39–117)
BUN: 6 mg/dL (ref 6–23)
CO2: 24 mEq/L (ref 19–32)
Chloride: 102 mEq/L (ref 96–112)
Creatinine, Ser: 0.5 mg/dL (ref 0.50–1.35)
GFR calc Af Amer: >90 mL/min (ref >90)
GFR calc non Af Amer: >90 mL/min (ref >90)
Glucose, Bld: 109 mg/dL — ABNORMAL HIGH (ref 70–99)
Potassium: 3 mEq/L — ABNORMAL LOW (ref 3.5–5.1)
Total Bilirubin: 2.7 mg/dL — ABNORMAL HIGH (ref 0.3–1.2)

## 2011-07-19 LAB — CBC
HCT: 25.2 % — ABNORMAL LOW (ref 39.0–52.0)
Hemoglobin: 8.6 g/dL — ABNORMAL LOW (ref 13.0–17.0)
MCV: 91.6 fL (ref 78.0–100.0)
RBC: 2.75 MIL/uL — ABNORMAL LOW (ref 4.22–5.81)
RDW: 19.7 % — ABNORMAL HIGH (ref 11.5–15.5)
WBC: 18.9 10*3/uL — ABNORMAL HIGH (ref 4.0–10.5)

## 2011-07-19 LAB — FERRITIN: Ferritin: 2552 ng/mL — ABNORMAL HIGH (ref 22–322)

## 2011-07-19 MED ORDER — ACETAMINOPHEN 325 MG PO TABS: 650.0000 mg | ORAL_TABLET | ORAL | Status: DC | PRN

## 2011-07-19 MED ORDER — OXYCODONE HCL 40 MG PO TB12
80.0000 mg | ORAL_TABLET | Freq: Two times a day (BID) | ORAL | Status: DC
  Administered 2011-07-19 – 2011-07-23 (×8): 80 mg via ORAL
  Filled 2011-07-19 (×8): qty 2

## 2011-07-19 MED ORDER — HYDROXYUREA 500 MG PO CAPS
500.0000 mg | ORAL_CAPSULE | Freq: Two times a day (BID) | ORAL | Status: DC
  Administered 2011-07-19 – 2011-07-23 (×8): 500 mg via ORAL
  Filled 2011-07-19 (×10): qty 1

## 2011-07-19 MED ORDER — PANTOPRAZOLE SODIUM 40 MG PO TBEC
40.0000 mg | DELAYED_RELEASE_TABLET | Freq: Every day | ORAL | Status: DC
  Administered 2011-07-19 – 2011-07-23 (×5): 40 mg via ORAL
  Filled 2011-07-19 (×7): qty 1

## 2011-07-19 MED ORDER — LIDOCAINE 5 % EX PTCH
2.0000 | MEDICATED_PATCH | CUTANEOUS | Status: DC
  Administered 2011-07-19 – 2011-07-23 (×5): 2 via TRANSDERMAL
  Filled 2011-07-19 (×5): qty 2

## 2011-07-19 MED ORDER — ACETAMINOPHEN 325 MG PO TABS
650.0000 mg | ORAL_TABLET | Freq: Once | ORAL | Status: AC
  Administered 2011-07-19: 650 mg via ORAL
  Filled 2011-07-19: qty 2

## 2011-07-19 MED ORDER — DIPHENHYDRAMINE HCL 50 MG/ML IJ SOLN
25.0000 mg | Freq: Once | INTRAMUSCULAR | Status: AC
  Administered 2011-07-19: 25 mg via INTRAVENOUS
  Filled 2011-07-19: qty 1

## 2011-07-19 MED ORDER — LEVOFLOXACIN IN D5W 750 MG/150ML IV SOLN
750.0000 mg | INTRAVENOUS | Status: DC
  Administered 2011-07-19 – 2011-07-21 (×3): 750 mg via INTRAVENOUS
  Filled 2011-07-19 (×4): qty 150

## 2011-07-19 MED ORDER — DEXTROSE 5 % IV SOLN
2000.0000 mg | Freq: Every day | INTRAVENOUS | Status: DC
  Administered 2011-07-19 – 2011-07-23 (×4): 2000 mg via INTRAVENOUS
  Filled 2011-07-19 (×5): qty 2

## 2011-07-19 MED ORDER — RISAQUAD PO CAPS
2.0000 | ORAL_CAPSULE | Freq: Every day | ORAL | Status: DC
  Administered 2011-07-19 – 2011-07-23 (×5): 2 via ORAL
  Filled 2011-07-19 (×5): qty 2

## 2011-07-19 MED ORDER — LEVALBUTEROL HCL 0.63 MG/3ML IN NEBU
0.6300 mg | INHALATION_SOLUTION | Freq: Two times a day (BID) | RESPIRATORY_TRACT | Status: DC
  Administered 2011-07-19 – 2011-07-23 (×7): 0.63 mg via RESPIRATORY_TRACT
  Filled 2011-07-19 (×11): qty 3

## 2011-07-19 MED ORDER — KETOROLAC TROMETHAMINE 30 MG/ML IJ SOLN
30.0000 mg | Freq: Three times a day (TID) | INTRAMUSCULAR | Status: DC
  Administered 2011-07-19 – 2011-07-23 (×13): 30 mg via INTRAVENOUS
  Filled 2011-07-19 (×17): qty 1

## 2011-07-19 MED ORDER — POLYETHYLENE GLYCOL 3350 17 G PO PACK
17.0000 g | PACK | Freq: Every day | ORAL | Status: DC
  Filled 2011-07-19 (×5): qty 1

## 2011-07-19 MED ORDER — HYDROMORPHONE HCL PF 2 MG/ML IJ SOLN
2.0000 mg | INTRAMUSCULAR | Status: DC
Start: 2011-07-19 — End: 2011-07-23
  Administered 2011-07-19: 2 mg via INTRAVENOUS
  Administered 2011-07-19 (×5): 4 mg via INTRAVENOUS
  Administered 2011-07-20: 2 mg via INTRAVENOUS
  Administered 2011-07-20 (×7): 4 mg via INTRAVENOUS
  Administered 2011-07-20: 2 mg via INTRAVENOUS
  Administered 2011-07-20: 4 mg via INTRAVENOUS
  Administered 2011-07-20: 2 mg via INTRAVENOUS
  Administered 2011-07-21 (×3): 4 mg via INTRAVENOUS
  Administered 2011-07-21: 2 mg via INTRAVENOUS
  Administered 2011-07-21: 4 mg via INTRAVENOUS
  Administered 2011-07-21: 2 mg via INTRAVENOUS
  Administered 2011-07-21 (×5): 4 mg via INTRAVENOUS
  Administered 2011-07-21: 2 mg via INTRAVENOUS
  Administered 2011-07-22 – 2011-07-23 (×14): 4 mg via INTRAVENOUS
  Administered 2011-07-23: 2 mg via INTRAVENOUS
  Administered 2011-07-23: 4 mg via INTRAVENOUS
  Administered 2011-07-23 (×3): 2 mg via INTRAVENOUS
  Filled 2011-07-19 (×28): qty 2
  Filled 2011-07-19: qty 1
  Filled 2011-07-19 (×19): qty 2

## 2011-07-19 NOTE — Progress Notes (Signed)
Subjective: The patient was seen on rounds today.  The patient was sitting in his bed, watching television and having laboratories drawn by his nurse.  The patient is rating his pain at a 6/10 in his bilateral flank areas and diffuse back pain. The patient stated that his bilateral LE pain has resolved.  The patient is also complaining of intermittent episodes of diaphoresis that happen periodically all throughout the day.  The patient received a PICC line and tolerated the procedure well.  No other nursing or patient concerns.  Objective: Vital signs in last 24 hours: Blood pressure 107/67, pulse 72, temperature 98.4 F (36.9 C), temperature source Oral, resp. rate 16, height 6' (1.829 m), weight 165 lb 3 oz (74.929 kg), SpO2 99.00%.  General Appearance: Alert and oriented, cooperative, well developed, well nourished, mild distress Head: Normocephalic, without obvious abnormality, atraumatic Eyes: PERRLA, EOMI, the patient has slight strabismus, scleral icterus Nose: Nares normal, septum midline, mucosa normal, no drainage or sinus tenderness. Throat:  Normal lips, mucosa, tongue and gums Back: Symmetric, bilateral CVA/flank tenderness, lower back tenderness Resp: CTA, no wheezes/rales/rhonchi Cardio: S1, S2 regular, no murmurs/clicks/rubs/gallops GI: Soft, non-tender, hypoactive bowel sounds, no masses, no organomegaly Male Genitalia: Deferred, the patient denies priapism Extremities: Extremities normal, atraumatic, no cyanosis or edema, negative Homans sign, no sign of DVT, no tenderness Pulses: 2+ and symmetric Skin: Skin color, texture, turgor normal, no rashes or lesions, numerous tattoos Neurologic: Grossly normal, CN II-XII intact, no focal deficits Psych:  Appropriate affect  Lab Results: Results for orders placed during the hospital encounter of 07/18/11 (from the past 24 hour(s))  RETICULOCYTES     Status: Abnormal   Collection Time   07/18/11  3:00 PM      Component Value  Range   Retic Ct Pct 13.0 (*) 0.4 - 3.1 %   RBC. 2.96 (*) 4.22 - 5.81 MIL/uL   Retic Count, Manual 384.8 (*) 19.0 - 186.0 K/uL  PRO B NATRIURETIC PEPTIDE     Status: Normal   Collection Time   07/18/11  3:00 PM      Component Value Range   Pro B Natriuretic peptide (BNP) 13.5  0 - 125 pg/mL  FERRITIN     Status: Abnormal   Collection Time   07/18/11  3:00 PM      Component Value Range   Ferritin 2552 (*) 22 - 322 ng/mL  COMPREHENSIVE METABOLIC PANEL     Status: Abnormal   Collection Time   07/18/11  3:05 PM      Component Value Range   Sodium 137  135 - 145 mEq/L   Potassium 3.3 (*) 3.5 - 5.1 mEq/L   Chloride 103  96 - 112 mEq/L   CO2 23  19 - 32 mEq/L   Glucose, Bld 101 (*) 70 - 99 mg/dL   BUN 7  6 - 23 mg/dL   Creatinine, Ser 9.60  0.50 - 1.35 mg/dL   Calcium 9.2  8.4 - 45.4 mg/dL   Total Protein 7.8  6.0 - 8.3 g/dL   Albumin 4.1  3.5 - 5.2 g/dL   AST 20  0 - 37 U/L   ALT 30  0 - 53 U/L   Alkaline Phosphatase 124 (*) 39 - 117 U/L   Total Bilirubin 3.3 (*) 0.3 - 1.2 mg/dL   GFR calc non Af Amer >90  >90 mL/min   GFR calc Af Amer >90  >90 mL/min  CBC     Status: Abnormal  Collection Time   07/18/11  3:05 PM      Component Value Range   WBC 18.5 (*) 4.0 - 10.5 K/uL   RBC 2.96 (*) 4.22 - 5.81 MIL/uL   Hemoglobin 9.2 (*) 13.0 - 17.0 g/dL   HCT 57.8 (*) 46.9 - 62.9 %   MCV 91.6  78.0 - 100.0 fL   MCH 31.1  26.0 - 34.0 pg   MCHC 33.9  30.0 - 36.0 g/dL   RDW 52.8 (*) 41.3 - 24.4 %   Platelets 498 (*) 150 - 400 K/uL  DIFFERENTIAL     Status: Abnormal   Collection Time   07/18/11  3:05 PM      Component Value Range   Neutrophils Relative 73  43 - 77 %   Lymphocytes Relative 14  12 - 46 %   Monocytes Relative 10  3 - 12 %   Eosinophils Relative 2  0 - 5 %   Basophils Relative 1  0 - 1 %   Neutro Abs 13.4 (*) 1.7 - 7.7 K/uL   Lymphs Abs 2.6  0.7 - 4.0 K/uL   Monocytes Absolute 1.9 (*) 0.1 - 1.0 K/uL   Eosinophils Absolute 0.4  0.0 - 0.7 K/uL   Basophils Absolute 0.2 (*)  0.0 - 0.1 K/uL   RBC Morphology SICKLE CELLS    MRSA PCR SCREENING     Status: Normal   Collection Time   07/18/11  9:13 PM      Component Value Range   MRSA by PCR NEGATIVE  NEGATIVE  CBC     Status: Abnormal   Collection Time   07/19/11  6:30 AM      Component Value Range   WBC 18.9 (*) 4.0 - 10.5 K/uL   RBC 2.75 (*) 4.22 - 5.81 MIL/uL   Hemoglobin 8.6 (*) 13.0 - 17.0 g/dL   HCT 01.0 (*) 27.2 - 53.6 %   MCV 91.6  78.0 - 100.0 fL   MCH 31.3  26.0 - 34.0 pg   MCHC 34.1  30.0 - 36.0 g/dL   RDW 64.4 (*) 03.4 - 74.2 %   Platelets 453 (*) 150 - 400 K/uL  COMPREHENSIVE METABOLIC PANEL     Status: Abnormal   Collection Time   07/19/11  6:30 AM      Component Value Range   Sodium 134 (*) 135 - 145 mEq/L   Potassium 3.0 (*) 3.5 - 5.1 mEq/L   Chloride 102  96 - 112 mEq/L   CO2 24  19 - 32 mEq/L   Glucose, Bld 109 (*) 70 - 99 mg/dL   BUN 6  6 - 23 mg/dL   Creatinine, Ser 5.95  0.50 - 1.35 mg/dL   Calcium 8.8  8.4 - 63.8 mg/dL   Total Protein 7.3  6.0 - 8.3 g/dL   Albumin 3.8  3.5 - 5.2 g/dL   AST 21  0 - 37 U/L   ALT 25  0 - 53 U/L   Alkaline Phosphatase 119 (*) 39 - 117 U/L   Total Bilirubin 2.7 (*) 0.3 - 1.2 mg/dL   GFR calc non Af Amer >90  >90 mL/min   GFR calc Af Amer >90  >90 mL/min    Studies/Results: Dg Chest 2 View  07/19/2011  *RADIOLOGY REPORT*  Clinical Data: Sickle cell crisis  CHEST - 2 VIEW  Comparison: 07/07/2011; 06/22/2011; 05/30/2011; chest CT - 04/25/2011  Findings: Grossly unchanged cardiac silhouette and mediastinal contours.  Stable positioning  of support apparatus.  There is mild diffuse increased conspicuity of the pulmonary interstitium.  No new focal airspace opacities.  No pleural effusion or pneumothorax. Grossly unchanged bones.  Post cholecystectomy.  IMPRESSION: Chronic interstitial/bronchitic change without acute cardiopulmonary disease.  Original Report Authenticated By: Waynard Reeds, M.D.   Medications:  Allergies  Allergen Reactions  .  Morphine And Related Hives and Rash    Pt states he is able to tolerate Dilaudid with no reactions.    Current Facility-Administered Medications  Medication Dose Route Frequency Provider Last Rate Last Dose  . 0.45 % NaCl with KCl 20 mEq / L infusion   Intravenous Continuous Keitha Butte, NP 75 mL/hr at 07/19/11 1208    . acetaminophen (TYLENOL) tablet 650 mg  650 mg Oral Once Keitha Butte, NP   650 mg at 07/19/11 1550  . acetaminophen (TYLENOL) tablet 650 mg  650 mg Oral Q4H PRN Keitha Butte, NP      . acidophilus (RISAQUAD) capsule 2 capsule  2 capsule Oral Daily Keitha Butte, NP   2 capsule at 07/19/11 1212  . deferoxamine (DESFERAL) 2,000 mg in dextrose 5 % 500 mL infusion  2,000 mg Intravenous QHS Keitha Butte, NP      . diphenhydrAMINE (BENADRYL) capsule 25-50 mg  25-50 mg Oral Q4H PRN Gwenyth Bender, MD   25 mg at 07/18/11 1641   Or  . diphenhydrAMINE (BENADRYL) injection 12.5-25 mg  12.5-25 mg Intravenous Q4H PRN Gwenyth Bender, MD   12.5 mg at 07/19/11 1101  . diphenhydrAMINE (BENADRYL) injection 25 mg  25 mg Intravenous Once Keitha Butte, NP   25 mg at 07/19/11 1550  . enoxaparin (LOVENOX) injection 40 mg  40 mg Subcutaneous Q24H Winfield Rast, PHARMD   40 mg at 07/18/11 2007  . folic acid (FOLVITE) tablet 1 mg  1 mg Oral Daily Gwenyth Bender, MD   1 mg at 07/19/11 6962  . HYDROmorphone (DILAUDID) injection 2-4 mg  2-4 mg Intravenous Q2H Keitha Butte, NP   4 mg at 07/19/11 1549  . hydroxyurea (HYDREA) capsule 500 mg  500 mg Oral BID Keitha Butte, NP      . ketorolac (TORADOL) 30 MG/ML injection 30 mg  30 mg Intravenous Q8H Keitha Butte, NP   30 mg at 07/19/11 1343  . levalbuterol (XOPENEX) nebulizer solution 0.63 mg  0.63 mg Nebulization BID Keitha Butte, NP   0.63 mg at 07/19/11 1239  . levofloxacin (LEVAQUIN) IVPB 750 mg  750 mg Intravenous Q24H Keitha Butte, NP   750 mg at 07/19/11 1213  . lidocaine (LIDODERM) 5  % 2 patch  2 patch Transdermal Q24H Keitha Butte, NP   2 patch at 07/19/11 1210  . OLANZapine (ZYPREXA) tablet 10 mg  10 mg Oral QHS Gwenyth Bender, MD      . ondansetron [facility]) tablet 4 mg  4 mg Oral Q4H PRN Gwenyth Bender, MD   4 mg at 07/18/11 1641   Or  . ondansetron (ZOFRAN) injection 4 mg  4 mg Intravenous Q4H PRN Gwenyth Bender, MD   4 mg at 07/19/11 1101  . oxyCODONE (OXYCONTIN) 12 hr tablet 80 mg  80 mg Oral BID Keitha Butte, NP      . pantoprazole (PROTONIX) EC tablet 40 mg  40 mg Oral Q0600 Keitha Butte, NP   40 mg at 07/19/11 1343  . polyethylene glycol (  MIRALAX / GLYCOLAX) packet 17 g  17 g Oral Daily Keitha Butte, NP      . potassium chloride SA (K-DUR,KLOR-CON) CR tablet 20 mEq  20 mEq Oral BID Gwenyth Bender, MD   20 mEq at 07/19/11 0921  . sodium chloride 0.9 % injection 10-40 mL  10-40 mL Intracatheter Q12H Gwenyth Bender, MD   10 mL at 07/19/11 0923  . sodium chloride 0.9 % injection 10-40 mL  10-40 mL Intracatheter PRN Gwenyth Bender, MD      . DISCONTD: azithromycin (ZITHROMAX) 500 mg in dextrose 5 % 250 mL IVPB  500 mg Intravenous QHS Gwenyth Bender, MD   500 mg at 07/19/11 0019  . DISCONTD: dextrose 5 %-0.45 % sodium chloride infusion   Intravenous Continuous Gwenyth Bender, MD 125 mL/hr at 07/18/11 1858    . DISCONTD: HYDROmorphone (DILAUDID) injection 2-4 mg  2-4 mg Intravenous Q2H PRN Gwenyth Bender, MD   2 mg at 07/18/11 2116  . DISCONTD: HYDROmorphone (DILAUDID) injection 4 mg  4 mg Intravenous Q2H PRN Gwenyth Bender, MD   4 mg at 07/19/11 1610  . DISCONTD: oxyCODONE (OXYCONTIN) 12 hr tablet 40 mg  40 mg Oral Q12H Gwenyth Bender, MD   40 mg at 07/19/11 1125   Assessment/Plan: Patient Active Problem List  Diagnosis  . Sickle cell pain crisis  . Leukocytosis  . Chronic pain  . Hypokalemia  . Metabolic acidosis  . Thrombocytosis  . Hemochromatosis  . Hypomagnesemia  . Acute chest syndrome due to sickle cell crisis  . Diarrhea  . Elevated brain natriuretic peptide  (BNP) level  . Bronchitis  . Sickle cell anemia  . Acute embolism and thrombosis of right internal jugular vein  . Avascular necrosis  . Metabolic encephalopathy   Sickle Cell Crisis/Chronic Pain: The patient is in active hemolysis and blood exchange x 2 has been ordered. The patient is receiving pain/nausea/pruritis/bowel management, IV hydration, home medications and GI/DVT prophylaxis. CMP and CBC in the am.  Mg, Phos, TSH, Free T4 and Hemoglobinopathy are pending.  PT asked to evaluate and treat the patient's back pain. (Possible) Acute chest syndrome: The patient is receiving antibiotic therapy with IV Levaquin, nebulizer are scheduled,  IS usage is encouraged.   Hypokalemia:  The patient is receiving KCL PO supplementation BID today and IVFs with 20 mEq KCL  - Will continue to monitor Hemochromatosis:  The patient will begin receiving nightly Desferal infusions - will continue to monitor  Discussed and agreed upon plan of care with the patient.  Plan of care will be adjusted based on the patient's clinical progress.   Larina Bras, NP-C 07/19/2011, 11:07 AM

## 2011-07-20 DIAGNOSIS — D5700 Hb-SS disease with crisis, unspecified: Secondary | ICD-10-CM | POA: Diagnosis not present

## 2011-07-20 LAB — COMPREHENSIVE METABOLIC PANEL
ALT: 21 U/L (ref 0–53)
AST: 23 U/L (ref 0–37)
Albumin: 3.6 g/dL (ref 3.5–5.2)
Alkaline Phosphatase: 104 U/L (ref 39–117)
BUN: 6 mg/dL (ref 6–23)
Chloride: 106 mEq/L (ref 96–112)
Potassium: 3.6 mEq/L (ref 3.5–5.1)
Sodium: 138 mEq/L (ref 135–145)
Total Bilirubin: 3.5 mg/dL — ABNORMAL HIGH (ref 0.3–1.2)
Total Protein: 6.9 g/dL (ref 6.0–8.3)

## 2011-07-20 LAB — CBC
HCT: 26.4 % — ABNORMAL LOW (ref 39.0–52.0)
MCHC: 33.7 g/dL (ref 30.0–36.0)
Platelets: 402 10*3/uL — ABNORMAL HIGH (ref 150–400)
RDW: 19.5 % — ABNORMAL HIGH (ref 11.5–15.5)
WBC: 13.4 10*3/uL — ABNORMAL HIGH (ref 4.0–10.5)

## 2011-07-20 LAB — TSH: TSH: 0.88 u[IU]/mL (ref 0.350–4.500)

## 2011-07-20 MED ORDER — LACTULOSE 10 GM/15ML PO SOLN
30.0000 g | Freq: Every day | ORAL | Status: DC
  Administered 2011-07-20 – 2011-07-21 (×2): 30 g via ORAL
  Filled 2011-07-20 (×2): qty 45

## 2011-07-20 NOTE — Progress Notes (Signed)
Subjective:  Patient is gradually feeling better. He continues to have left-sided pain though has decreased. He rates his pain as a 6/10. He denies fever chills or night sweats. His right eye was noted deviated to the right when he first awakens. He reports having this intermittently over the past year. There has been no eye pain. No other new complaints.   Allergies  Allergen Reactions  . Morphine And Related Hives and Rash    Pt states he is able to tolerate Dilaudid with no reactions.   Current Facility-Administered Medications  Medication Dose Route Frequency Provider Last Rate Last Dose  . 0.45 % NaCl with KCl 20 mEq / L infusion   Intravenous Continuous Keitha Butte, NP 75 mL/hr at 07/20/11 0715    . acetaminophen (TYLENOL) tablet 650 mg  650 mg Oral Once Keitha Butte, NP   650 mg at 07/19/11 1550  . acetaminophen (TYLENOL) tablet 650 mg  650 mg Oral Q4H PRN Keitha Butte, NP      . acidophilus (RISAQUAD) capsule 2 capsule  2 capsule Oral Daily Keitha Butte, NP   2 capsule at 07/20/11 1037  . deferoxamine (DESFERAL) 2,000 mg in dextrose 5 % 500 mL infusion  2,000 mg Intravenous QHS Keitha Butte, NP 125 mL/hr at 07/19/11 2233 2,000 mg at 07/19/11 2233  . diphenhydrAMINE (BENADRYL) capsule 25-50 mg  25-50 mg Oral Q4H PRN Gwenyth Bender, MD   25 mg at 07/18/11 1641   Or  . diphenhydrAMINE (BENADRYL) injection 12.5-25 mg  12.5-25 mg Intravenous Q4H PRN Gwenyth Bender, MD   12.5 mg at 07/20/11 1038  . diphenhydrAMINE (BENADRYL) injection 25 mg  25 mg Intravenous Once Keitha Butte, NP   25 mg at 07/19/11 1550  . enoxaparin (LOVENOX) injection 40 mg  40 mg Subcutaneous Q24H Winfield Rast, PHARMD   40 mg at 07/19/11 2101  . folic acid (FOLVITE) tablet 1 mg  1 mg Oral Daily Gwenyth Bender, MD   1 mg at 07/20/11 1037  . HYDROmorphone (DILAUDID) injection 2-4 mg  2-4 mg Intravenous Q2H Keitha Butte, NP   4 mg at 07/20/11 1038  . hydroxyurea (HYDREA)  capsule 500 mg  500 mg Oral BID Keitha Butte, NP   500 mg at 07/20/11 1039  . ketorolac (TORADOL) 30 MG/ML injection 30 mg  30 mg Intravenous Q8H Keitha Butte, NP   30 mg at 07/20/11 1610  . levalbuterol (XOPENEX) nebulizer solution 0.63 mg  0.63 mg Nebulization BID Keitha Butte, NP   0.63 mg at 07/19/11 2148  . levofloxacin (LEVAQUIN) IVPB 750 mg  750 mg Intravenous Q24H Keitha Butte, NP   750 mg at 07/19/11 1213  . lidocaine (LIDODERM) 5 % 2 patch  2 patch Transdermal Q24H Keitha Butte, NP   2 patch at 07/19/11 1210  . OLANZapine (ZYPREXA) tablet 10 mg  10 mg Oral QHS Gwenyth Bender, MD   10 mg at 07/19/11 2233  . ondansetron (ZOFRAN) tablet 4 mg  4 mg Oral Q4H PRN Gwenyth Bender, MD   4 mg at 07/18/11 1641   Or  . ondansetron (ZOFRAN) injection 4 mg  4 mg Intravenous Q4H PRN Gwenyth Bender, MD   4 mg at 07/20/11 1038  . oxyCODONE (OXYCONTIN) 12 hr tablet 80 mg  80 mg Oral BID Keitha Butte, NP   80 mg at 07/20/11 1037  . pantoprazole (PROTONIX) EC tablet  40 mg  40 mg Oral Q0600 Keitha Butte, NP   40 mg at 07/20/11 1610  . polyethylene glycol (MIRALAX / GLYCOLAX) packet 17 g  17 g Oral Daily Keitha Butte, NP      . potassium chloride SA (K-DUR,KLOR-CON) CR tablet 20 mEq  20 mEq Oral BID Gwenyth Bender, MD   20 mEq at 07/20/11 1037  . sodium chloride 0.9 % injection 10-40 mL  10-40 mL Intracatheter Q12H Gwenyth Bender, MD   10 mL at 07/20/11 1039  . sodium chloride 0.9 % injection 10-40 mL  10-40 mL Intracatheter PRN Gwenyth Bender, MD      . DISCONTD: oxyCODONE (OXYCONTIN) 12 hr tablet 40 mg  40 mg Oral Q12H Gwenyth Bender, MD   40 mg at 07/19/11 1125    Objective: Blood pressure 117/76, pulse 73, temperature 98.5 F (36.9 C), temperature source Oral, resp. rate 18, height 6' (1.829 m), weight 165 lb 3 oz (74.929 kg), SpO2 97.00%.  Well-developed nourished black male in no acute distress. HEENT: No sinus tenderness. Sclera icterus process. NECK: No enlarged  thyroid. No posterior cervical nodes. LUNGS: Clear to auscultation. No CVA tenderness. Tenderness along the lower costochondral region left greater than right. CV: Normal S1, S2 without S3. ABD: Soft nontender. MSK: Tenderness on anterior tibial regions bilaterally. Negative Homans. NEURO: Neurologically intact. Right eye occasionally deviates laterally.  Lab results: Results for orders placed during the hospital encounter of 07/18/11 (from the past 48 hour(s))  RETICULOCYTES     Status: Abnormal   Collection Time   07/18/11  3:00 PM      Component Value Range Comment   Retic Ct Pct 13.0 (*) 0.4 - 3.1 %    RBC. 2.96 (*) 4.22 - 5.81 MIL/uL    Retic Count, Manual 384.8 (*) 19.0 - 186.0 K/uL   PRO B NATRIURETIC PEPTIDE     Status: Normal   Collection Time   07/18/11  3:00 PM      Component Value Range Comment   Pro B Natriuretic peptide (BNP) 13.5  0 - 125 pg/mL   FERRITIN     Status: Abnormal   Collection Time   07/18/11  3:00 PM      Component Value Range Comment   Ferritin 2552 (*) 22 - 322 ng/mL Result confirmed by automatic dilution.  COMPREHENSIVE METABOLIC PANEL     Status: Abnormal   Collection Time   07/18/11  3:05 PM      Component Value Range Comment   Sodium 137  135 - 145 mEq/L    Potassium 3.3 (*) 3.5 - 5.1 mEq/L    Chloride 103  96 - 112 mEq/L    CO2 23  19 - 32 mEq/L    Glucose, Bld 101 (*) 70 - 99 mg/dL    BUN 7  6 - 23 mg/dL    Creatinine, Ser 9.60  0.50 - 1.35 mg/dL    Calcium 9.2  8.4 - 45.4 mg/dL    Total Protein 7.8  6.0 - 8.3 g/dL    Albumin 4.1  3.5 - 5.2 g/dL    AST 20  0 - 37 U/L    ALT 30  0 - 53 U/L    Alkaline Phosphatase 124 (*) 39 - 117 U/L    Total Bilirubin 3.3 (*) 0.3 - 1.2 mg/dL    GFR calc non Af Amer >90  >90 mL/min    GFR calc Af Amer >90  >90  mL/min   CBC     Status: Abnormal   Collection Time   07/18/11  3:05 PM      Component Value Range Comment   WBC 18.5 (*) 4.0 - 10.5 K/uL    RBC 2.96 (*) 4.22 - 5.81 MIL/uL    Hemoglobin 9.2 (*)  13.0 - 17.0 g/dL    HCT 16.1 (*) 09.6 - 52.0 %    MCV 91.6  78.0 - 100.0 fL    MCH 31.1  26.0 - 34.0 pg    MCHC 33.9  30.0 - 36.0 g/dL    RDW 04.5 (*) 40.9 - 15.5 %    Platelets 498 (*) 150 - 400 K/uL   DIFFERENTIAL     Status: Abnormal   Collection Time   07/18/11  3:05 PM      Component Value Range Comment   Neutrophils Relative 73  43 - 77 %    Lymphocytes Relative 14  12 - 46 %    Monocytes Relative 10  3 - 12 %    Eosinophils Relative 2  0 - 5 %    Basophils Relative 1  0 - 1 %    Neutro Abs 13.4 (*) 1.7 - 7.7 K/uL    Lymphs Abs 2.6  0.7 - 4.0 K/uL    Monocytes Absolute 1.9 (*) 0.1 - 1.0 K/uL    Eosinophils Absolute 0.4  0.0 - 0.7 K/uL    Basophils Absolute 0.2 (*) 0.0 - 0.1 K/uL    RBC Morphology SICKLE CELLS     MRSA PCR SCREENING     Status: Normal   Collection Time   07/18/11  9:13 PM      Component Value Range Comment   MRSA by PCR NEGATIVE  NEGATIVE   CBC     Status: Abnormal   Collection Time   07/19/11  6:30 AM      Component Value Range Comment   WBC 18.9 (*) 4.0 - 10.5 K/uL    RBC 2.75 (*) 4.22 - 5.81 MIL/uL    Hemoglobin 8.6 (*) 13.0 - 17.0 g/dL    HCT 81.1 (*) 91.4 - 52.0 %    MCV 91.6  78.0 - 100.0 fL    MCH 31.3  26.0 - 34.0 pg    MCHC 34.1  30.0 - 36.0 g/dL    RDW 78.2 (*) 95.6 - 15.5 %    Platelets 453 (*) 150 - 400 K/uL   COMPREHENSIVE METABOLIC PANEL     Status: Abnormal   Collection Time   07/19/11  6:30 AM      Component Value Range Comment   Sodium 134 (*) 135 - 145 mEq/L    Potassium 3.0 (*) 3.5 - 5.1 mEq/L    Chloride 102  96 - 112 mEq/L    CO2 24  19 - 32 mEq/L    Glucose, Bld 109 (*) 70 - 99 mg/dL    BUN 6  6 - 23 mg/dL    Creatinine, Ser 2.13  0.50 - 1.35 mg/dL    Calcium 8.8  8.4 - 08.6 mg/dL    Total Protein 7.3  6.0 - 8.3 g/dL    Albumin 3.8  3.5 - 5.2 g/dL    AST 21  0 - 37 U/L    ALT 25  0 - 53 U/L    Alkaline Phosphatase 119 (*) 39 - 117 U/L    Total Bilirubin 2.7 (*) 0.3 - 1.2 mg/dL    GFR calc non  Af Amer >90  >90 mL/min     GFR calc Af Amer >90  >90 mL/min   PREPARE RBC (CROSSMATCH)     Status: Normal   Collection Time   07/19/11  9:38 AM      Component Value Range Comment   Order Confirmation ORDER PROCESSED BY BLOOD BANK     MAGNESIUM     Status: Normal   Collection Time   07/19/11 10:50 AM      Component Value Range Comment   Magnesium 1.8  1.5 - 2.5 mg/dL   PHOSPHORUS     Status: Normal   Collection Time   07/19/11 10:50 AM      Component Value Range Comment   Phosphorus 3.8  2.3 - 4.6 mg/dL   AMMONIA     Status: Abnormal   Collection Time   07/19/11 10:50 AM      Component Value Range Comment   Ammonia 73 (*) 11 - 60 umol/L   TSH     Status: Normal   Collection Time   07/19/11 12:30 PM      Component Value Range Comment   TSH 0.880  0.350 - 4.500 uIU/mL   T4, FREE     Status: Normal   Collection Time   07/19/11 12:30 PM      Component Value Range Comment   Free T4 1.40  0.80 - 1.80 ng/dL   TYPE AND SCREEN     Status: Normal   Collection Time   07/19/11 12:33 PM      Component Value Range Comment   ABO/RH(D) O POS      Antibody Screen NEG      Sample Expiration 07/22/2011      Unit Number [id number]      Blood Component Type RED CELLS,LR      Unit division 00      Status of Unit ISSUED,FINAL      Donor AG Type        Value: NEGATIVE FOR C ANTIGEN NEGATIVE FOR E ANTIGEN NEGATIVE FOR KELL ANTIGEN   Transfusion Status OK TO TRANSFUSE      Crossmatch Result COMPATIBLE      Unit Number [id number]      Blood Component Type RED CELLS,LR      Unit division 00      Status of Unit ISSUED,FINAL      Donor AG Type        Value: NEGATIVE FOR C ANTIGEN NEGATIVE FOR E ANTIGEN NEGATIVE FOR KELL ANTIGEN   Transfusion Status OK TO TRANSFUSE      Crossmatch Result COMPATIBLE     CBC     Status: Abnormal   Collection Time   07/20/11  5:20 AM      Component Value Range Comment   WBC 13.4 (*) 4.0 - 10.5 K/uL    RBC 2.95 (*) 4.22 - 5.81 MIL/uL    Hemoglobin 8.9 (*) 13.0 - 17.0 g/dL    HCT 47.8 (*) 29.5 -  52.0 %    MCV 89.5  78.0 - 100.0 fL    MCH 30.2  26.0 - 34.0 pg    MCHC 33.7  30.0 - 36.0 g/dL    RDW 62.1 (*) 30.8 - 15.5 %    Platelets 402 (*) 150 - 400 K/uL   COMPREHENSIVE METABOLIC PANEL     Status: Abnormal   Collection Time   07/20/11  5:20 AM      Component Value Range Comment   Sodium  138  135 - 145 mEq/L    Potassium 3.6  3.5 - 5.1 mEq/L    Chloride 106  96 - 112 mEq/L    CO2 22  19 - 32 mEq/L    Glucose, Bld 122 (*) 70 - 99 mg/dL    BUN 6  6 - 23 mg/dL    Creatinine, Ser 2.13  0.50 - 1.35 mg/dL    Calcium 8.8  8.4 - 08.6 mg/dL    Total Protein 6.9  6.0 - 8.3 g/dL    Albumin 3.6  3.5 - 5.2 g/dL    AST 23  0 - 37 U/L    ALT 21  0 - 53 U/L    Alkaline Phosphatase 104  39 - 117 U/L    Total Bilirubin 3.5 (*) 0.3 - 1.2 mg/dL    GFR calc non Af Amer >90  >90 mL/min    GFR calc Af Amer >90  >90 mL/min     Studies/Results: Dg Chest 2 View  07/19/2011  *RADIOLOGY REPORT*  Clinical Data: Sickle cell crisis  CHEST - 2 VIEW  Comparison: 07/07/2011; 06/22/2011; 05/30/2011; chest CT - 04/25/2011  Findings: Grossly unchanged cardiac silhouette and mediastinal contours.  Stable positioning of support apparatus.  There is mild diffuse increased conspicuity of the pulmonary interstitium.  No new focal airspace opacities.  No pleural effusion or pneumothorax. Grossly unchanged bones.  Post cholecystectomy.  IMPRESSION: Chronic interstitial/bronchitic change without acute cardiopulmonary disease.  Original Report Authenticated By: Waynard Reeds, M.D.    Patient Active Problem List  Diagnosis  . Sickle cell pain crisis  . Leukocytosis  . Chronic pain  . Hypokalemia  . Metabolic acidosis  . Thrombocytosis  . Hemochromatosis  . Hypomagnesemia  . Acute chest syndrome due to sickle cell crisis  . Diarrhea  . Elevated brain natriuretic peptide (BNP) level  . Bronchitis  . Sickle cell anemia  . Acute embolism and thrombosis of right internal jugular vein  . Avascular necrosis  .  Metabolic encephalopathy    Impression: Sickle cell crisis. Pain gradually improving. Secondary hemachromatosis. Elevated ammonia with early hepatic encephalopathy. Sickle cell lung disease. Resolving acute chest syndrome. Leukocytosis secondary 1. Gradually resolving.   Plan: Continue present therapy. At lactulose 30 cc daily. Continue empiric antibiotic, incentive spirometry. Home some when pain is manageable.   [name], Kilea Mccarey 07/20/2011 11:13 AM

## 2011-07-20 NOTE — Evaluation (Signed)
Physical Therapy Evaluation Patient Details Name: [name] MRN: [id number] DOB: 1980/08/10 Today's Date: 07/20/2011 Time: 1015-1024 PT Time Calculation (min): 9 min  PT Assessment / Plan / Recommendation Clinical Impression  31 y.o. male admitted with sicle cell crisis.  Pt is independent with mobility, no further PT needed. Pt ambulated 300' without assistance device, no LOB. PT signing off.     PT Assessment  Patent does not need any further PT services    Follow Up Recommendations  No PT follow up    Barriers to Discharge        lEquipment Recommendations  None recommended by PT    Recommendations for Other Services     Frequency      Precautions / Restrictions Precautions Precaution Comments: h/o R THA and revision Restrictions Weight Bearing Restrictions: No   Pertinent Vitals/Pain *6/10 rib pain with activity and at rest Pt premedicated**      Mobility  Bed Mobility Bed Mobility: Supine to Sit Supine to Sit: 7: Independent Transfers Transfers: Sit to Stand;Stand to Sit Sit to Stand: 7: Independent Stand to Sit: 7: Independent Ambulation/Gait Ambulation/Gait Assistance: 7: Independent Ambulation Distance (Feet): 300 Feet Assistive device: None Gait Pattern: Within Functional Limits    Exercises     PT Diagnosis:    PT Problem List:   PT Treatment Interventions:     PT Goals    Visit Information  Last PT Received On: 07/20/11 Assistance Needed: +1    Subjective Data  Subjective: I can walk around fine.  Patient Stated Goal: DC home   Prior Functioning  Home Living Lives With: Significant other Available Help at Discharge: Friend(s) Type of Home: Apartment Home Access: Stairs to enter [employer] of Steps: 14 Entrance Stairs-Rails: Right Home Layout: One level Prior Function Level of Independence: Independent Able to Take Stairs?: Yes Driving: Yes Communication Communication: No difficulties    Cognition  Overall  Cognitive Status: Appears within functional limits for tasks assessed/performed Arousal/Alertness: Awake/alert Orientation Level: Appears intact for tasks assessed Behavior During Session: [facility] for tasks performed    Extremity/Trunk Assessment Right Upper Extremity Assessment RUE ROM/Strength/Tone: [facility] for tasks assessed Left Upper Extremity Assessment LUE ROM/Strength/Tone: [facility] for tasks assessed Right Lower Extremity Assessment RLE ROM/Strength/Tone: [facility] for tasks assessed Left Lower Extremity Assessment LLE ROM/Strength/Tone: [facility] for tasks assessed Trunk Assessment Trunk Assessment: Normal   Balance    End of Session PT - End of Session Activity Tolerance: Patient tolerated treatment well Patient left: in bed Nurse Communication: Mobility status   Tamala Ser 07/20/2011, 10:29 AM [phone]

## 2011-07-21 DIAGNOSIS — D5700 Hb-SS disease with crisis, unspecified: Secondary | ICD-10-CM | POA: Diagnosis not present

## 2011-07-21 DIAGNOSIS — R7989 Other specified abnormal findings of blood chemistry: Secondary | ICD-10-CM

## 2011-07-21 LAB — COMPREHENSIVE METABOLIC PANEL
Alkaline Phosphatase: 108 U/L (ref 39–117)
BUN: 6 mg/dL (ref 6–23)
Creatinine, Ser: 0.61 mg/dL (ref 0.50–1.35)
GFR calc Af Amer: >90 mL/min (ref >90)
Glucose, Bld: 99 mg/dL (ref 70–99)
Potassium: 4.1 mEq/L (ref 3.5–5.1)
Total Bilirubin: 3.6 mg/dL — ABNORMAL HIGH (ref 0.3–1.2)
Total Protein: 7 g/dL (ref 6.0–8.3)

## 2011-07-21 LAB — HEMOGLOBINOPATHY EVALUATION
Hemoglobin Other: 0 %
Hgb A2 Quant: 3.1 % (ref 2.2–3.2)
Hgb F Quant: 3.8 % — ABNORMAL HIGH (ref 0.0–2.0)

## 2011-07-21 LAB — CBC
HCT: 25.8 % — ABNORMAL LOW (ref 39.0–52.0)
Hemoglobin: 8.8 g/dL — ABNORMAL LOW (ref 13.0–17.0)
MCHC: 34.1 g/dL (ref 30.0–36.0)
MCV: 89.6 fL (ref 78.0–100.0)
RDW: 19.6 % — ABNORMAL HIGH (ref 11.5–15.5)

## 2011-07-21 LAB — PREPARE RBC (CROSSMATCH)

## 2011-07-21 LAB — AMMONIA: Ammonia: 65 umol/L — ABNORMAL HIGH (ref 11–60)

## 2011-07-21 MED ORDER — LACTULOSE 10 GM/15ML PO SOLN
10.0000 g | Freq: Every day | ORAL | Status: DC
  Administered 2011-07-22 – 2011-07-23 (×2): 10 g via ORAL
  Filled 2011-07-21 (×2): qty 15

## 2011-07-21 MED ORDER — ZOLPIDEM TARTRATE 5 MG PO TABS
5.0000 mg | ORAL_TABLET | Freq: Every evening | ORAL | Status: DC | PRN
  Administered 2011-07-22 (×2): 5 mg via ORAL
  Filled 2011-07-21 (×2): qty 1

## 2011-07-21 MED ORDER — POTASSIUM CHLORIDE CRYS ER 20 MEQ PO TBCR
20.0000 meq | EXTENDED_RELEASE_TABLET | Freq: Every day | ORAL | Status: DC
Start: 2011-07-22 — End: 2011-07-23
  Administered 2011-07-22 – 2011-07-23 (×2): 20 meq via ORAL
  Filled 2011-07-21 (×2): qty 1

## 2011-07-21 NOTE — Progress Notes (Signed)
Subjective: The patient was seen on rounds today.  The patient was sitting in his bed quietly and watching television.  The patient is rating his pain at a 6/10 in his left flank area and diffuse back pain (worse in his lower back). Let the patient know that since he is active hemolysis, a blood exchange x 2 has been ordered.  Also let the patient know that lactulose will continue for now.  The patient has a wedding that he would like to attend next week and wants to make sure he is discharged in enough time to attend the wedding.  PT has seen and evaluated the patient.  No other nursing or patient concerns.  Objective: Vital signs in last 24 hours: Blood pressure 128/81, pulse 75, temperature 98.8 F (37.1 C), temperature source Oral, resp. rate 18, height 6' (1.829 m), weight 188 lb 7.9 oz (85.5 kg), SpO2 99.00%.  General Appearance: Alert and oriented, cooperative, well developed, well nourished, mild distress Head: Normocephalic, without obvious abnormality, atraumatic Eyes: PERRLA, EOMI, the patient has slight strabismus, scleral icterus Nose: Nares normal, septum midline, mucosa normal, no drainage or sinus tenderness. Throat:  Normal lips, mucosa, tongue and gums Back: Symmetric, left CVA/flank tenderness, lower back tenderness Resp: CTA, no wheezes/rales/rhonchi Cardio: S1, S2 regular, no murmurs/clicks/rubs/gallops GI: Soft, non-tender, normoactive bowel sounds, no masses, no organomegaly Male Genitalia: Deferred, the patient denies priapism Extremities: Extremities normal, atraumatic, no cyanosis or edema, negative Homans sign, no sign of DVT, no tenderness Pulses: 2+ and symmetric Skin: Skin color, texture, turgor normal, no rashes or lesions, numerous tattoos Neurologic: Grossly normal, CN II-XII intact, no focal deficits Psych:  Appropriate affect  Lab Results: Results for orders placed during the hospital encounter of 07/18/11 (from the past 24 hour(s))  AMMONIA     Status:  Abnormal   Collection Time   07/21/11  5:00 AM      Component Value Range   Ammonia 65 (*) 11 - 60 umol/L  CBC     Status: Abnormal   Collection Time   07/21/11  6:25 AM      Component Value Range   WBC 14.5 (*) 4.0 - 10.5 K/uL   RBC 2.88 (*) 4.22 - 5.81 MIL/uL   Hemoglobin 8.8 (*) 13.0 - 17.0 g/dL   HCT 16.1 (*) 09.6 - 04.5 %   MCV 89.6  78.0 - 100.0 fL   MCH 30.6  26.0 - 34.0 pg   MCHC 34.1  30.0 - 36.0 g/dL   RDW 40.9 (*) 81.1 - 91.4 %   Platelets 383  150 - 400 K/uL  COMPREHENSIVE METABOLIC PANEL     Status: Abnormal   Collection Time   07/21/11  6:25 AM      Component Value Range   Sodium 137  135 - 145 mEq/L   Potassium 4.1  3.5 - 5.1 mEq/L   Chloride 108  96 - 112 mEq/L   CO2 23  19 - 32 mEq/L   Glucose, Bld 99  70 - 99 mg/dL   BUN 6  6 - 23 mg/dL   Creatinine, Ser 7.82  0.50 - 1.35 mg/dL   Calcium 8.8  8.4 - 95.6 mg/dL   Total Protein 7.0  6.0 - 8.3 g/dL   Albumin 3.7  3.5 - 5.2 g/dL   AST 33  0 - 37 U/L   ALT 24  0 - 53 U/L   Alkaline Phosphatase 108  39 - 117 U/L   Total Bilirubin 3.6 (*)  0.3 - 1.2 mg/dL   GFR calc non Af Amer >90  >90 mL/min   GFR calc Af Amer >90  >90 mL/min  PREPARE RBC (CROSSMATCH)     Status: Normal   Collection Time   07/21/11  2:30 PM      Component Value Range   Order Confirmation ORDER PROCESSED BY BLOOD BANK      Studies/Results: No results found. Medications:  Allergies  Allergen Reactions  . Morphine And Related Hives and Rash    Pt states he is able to tolerate Dilaudid with no reactions.    Current Facility-Administered Medications  Medication Dose Route Frequency Provider Last Rate Last Dose  . 0.45 % NaCl with KCl 20 mEq / L infusion   Intravenous Continuous Keitha Butte, NP 75 mL/hr at 07/21/11 1036    . acetaminophen (TYLENOL) tablet 650 mg  650 mg Oral Q4H PRN Keitha Butte, NP      . acidophilus (RISAQUAD) capsule 2 capsule  2 capsule Oral Daily Keitha Butte, NP   2 capsule at 07/21/11 0933  .  deferoxamine (DESFERAL) 2,000 mg in dextrose 5 % 500 mL infusion  2,000 mg Intravenous QHS Keitha Butte, NP 125 mL/hr at 07/20/11 2146 2,000 mg at 07/20/11 2146  . diphenhydrAMINE (BENADRYL) capsule 25-50 mg  25-50 mg Oral Q4H PRN Gwenyth Bender, MD   25 mg at 07/18/11 1641   Or  . diphenhydrAMINE (BENADRYL) injection 12.5-25 mg  12.5-25 mg Intravenous Q4H PRN Gwenyth Bender, MD   25 mg at 07/21/11 1036  . enoxaparin (LOVENOX) injection 40 mg  40 mg Subcutaneous Q24H Winfield Rast, PHARMD   40 mg at 07/20/11 2145  . folic acid (FOLVITE) tablet 1 mg  1 mg Oral Daily Gwenyth Bender, MD   1 mg at 07/21/11 0934  . HYDROmorphone (DILAUDID) injection 2-4 mg  2-4 mg Intravenous Q2H Keitha Butte, NP   4 mg at 07/21/11 1229  . hydroxyurea (HYDREA) capsule 500 mg  500 mg Oral BID Keitha Butte, NP   500 mg at 07/21/11 1311  . ketorolac (TORADOL) 30 MG/ML injection 30 mg  30 mg Intravenous Q8H Keitha Butte, NP   30 mg at 07/21/11 0502  . lactulose (CHRONULAC) 10 GM/15ML solution 10 g  10 g Oral Daily Keitha Butte, NP      . levalbuterol (XOPENEX) nebulizer solution 0.63 mg  0.63 mg Nebulization BID Keitha Butte, NP   0.63 mg at 07/21/11 0810  . levofloxacin (LEVAQUIN) IVPB 750 mg  750 mg Intravenous Q24H Keitha Butte, NP   750 mg at 07/21/11 1228  . lidocaine (LIDODERM) 5 % 2 patch  2 patch Transdermal Q24H Keitha Butte, NP   2 patch at 07/21/11 1228  . OLANZapine (ZYPREXA) tablet 10 mg  10 mg Oral QHS Gwenyth Bender, MD   10 mg at 07/20/11 2145  . ondansetron (ZOFRAN) tablet 4 mg  4 mg Oral Q4H PRN Gwenyth Bender, MD   4 mg at 07/18/11 1641   Or  . ondansetron (ZOFRAN) injection 4 mg  4 mg Intravenous Q4H PRN Gwenyth Bender, MD   4 mg at 07/21/11 1035  . oxyCODONE (OXYCONTIN) 12 hr tablet 80 mg  80 mg Oral BID Keitha Butte, NP   80 mg at 07/21/11 0933  . pantoprazole (PROTONIX) EC tablet 40 mg  40 mg Oral Q0600 Keitha Butte, NP   40  mg at 07/21/11 0502    . polyethylene glycol (MIRALAX / GLYCOLAX) packet 17 g  17 g Oral Daily Keitha Butte, NP      . potassium chloride SA (K-DUR,KLOR-CON) CR tablet 20 mEq  20 mEq Oral Daily Keitha Butte, NP      . sodium chloride 0.9 % injection 10-40 mL  10-40 mL Intracatheter Q12H Gwenyth Bender, MD   30 mL at 07/20/11 2148  . sodium chloride 0.9 % injection 10-40 mL  10-40 mL Intracatheter PRN Gwenyth Bender, MD      . DISCONTD: lactulose (CHRONULAC) 10 GM/15ML solution 30 g  30 g Oral Daily Gwenyth Bender, MD   30 g at 07/21/11 0933  . DISCONTD: potassium chloride SA (K-DUR,KLOR-CON) CR tablet 20 mEq  20 mEq Oral BID Gwenyth Bender, MD   20 mEq at 07/21/11 1610   Assessment/Plan: Patient Active Problem List  Diagnosis  . Sickle cell pain crisis  . Leukocytosis  . Chronic pain  . Hypokalemia  . Metabolic acidosis  . Thrombocytosis  . Hemochromatosis  . Hypomagnesemia  . Acute chest syndrome due to sickle cell crisis  . Diarrhea  . Elevated brain natriuretic peptide (BNP) level  . Bronchitis  . Sickle cell anemia  . Acute embolism and thrombosis of right internal jugular vein  . Avascular necrosis  . Metabolic encephalopathy  . Increased ammonia level   Sickle Cell Crisis/Chronic Pain: The patient is in active hemolysis and blood exchange x 2 has been ordered. The patient is receiving pain/nausea/pruritis/bowel management, IV hydration, home medications and GI/DVT prophylaxis. CMP and CBC in the am.  Hemoglobinopathy has been received. (Possible) Acute chest syndrome: The patient is receiving antibiotic therapy with IV Levaquin, nebulizer are scheduled,  IS usage is encouraged.   Hypokalemia:  The patient is receiving KCL PO supplementation and IVFs with  - Will continue to monitor Hemochromatosis:  The patient will begin receiving nightly Desferal infusions - Check Ferritin in the am Elevated Ammonia Level:  The patient will continue receiving Lactulose daily - Will recheck ammonia level in the  am  Discussed and agreed upon plan of care with the patient.  Plan of care will be adjusted based on the patient's clinical progress.   Larina Bras, NP-C 07/21/2011, 2:15 PM

## 2011-07-22 DIAGNOSIS — D5700 Hb-SS disease with crisis, unspecified: Secondary | ICD-10-CM | POA: Diagnosis not present

## 2011-07-22 LAB — AMMONIA: Ammonia: 54 umol/L (ref 11–60)

## 2011-07-22 LAB — COMPREHENSIVE METABOLIC PANEL
ALT: 33 U/L (ref 0–53)
Alkaline Phosphatase: 117 U/L (ref 39–117)
BUN: 8 mg/dL (ref 6–23)
CO2: 23 mEq/L (ref 19–32)
Chloride: 106 mEq/L (ref 96–112)
GFR calc Af Amer: >90 mL/min (ref >90)
GFR calc non Af Amer: >90 mL/min (ref >90)
Glucose, Bld: 93 mg/dL (ref 70–99)
Potassium: 3.8 mEq/L (ref 3.5–5.1)
Total Bilirubin: 3 mg/dL — ABNORMAL HIGH (ref 0.3–1.2)
Total Protein: 6.9 g/dL (ref 6.0–8.3)

## 2011-07-22 LAB — CBC
HCT: 28.5 % — ABNORMAL LOW (ref 39.0–52.0)
Hemoglobin: 9.5 g/dL — ABNORMAL LOW (ref 13.0–17.0)
MCHC: 33.3 g/dL (ref 30.0–36.0)
WBC: 15.5 10*3/uL — ABNORMAL HIGH (ref 4.0–10.5)

## 2011-07-22 MED ORDER — ACETAMINOPHEN 325 MG PO TABS
650.0000 mg | ORAL_TABLET | Freq: Once | ORAL | Status: AC
  Administered 2011-07-22: 650 mg via ORAL
  Filled 2011-07-22 (×2): qty 2

## 2011-07-22 MED ORDER — DIPHENHYDRAMINE HCL 50 MG/ML IJ SOLN
25.0000 mg | Freq: Once | INTRAMUSCULAR | Status: AC
  Administered 2011-07-22: 25 mg via INTRAVENOUS
  Filled 2011-07-22: qty 1

## 2011-07-22 MED ORDER — DEXTROSE 5 % IV SOLN
500.0000 mg | INTRAVENOUS | Status: DC
  Administered 2011-07-23: 500 mg via INTRAVENOUS
  Filled 2011-07-22 (×2): qty 500

## 2011-07-22 MED ORDER — FLUCONAZOLE 200 MG PO TABS
200.0000 mg | ORAL_TABLET | Freq: Every day | ORAL | Status: AC
  Administered 2011-07-22 – 2011-07-23 (×2): 200 mg via ORAL
  Filled 2011-07-22 (×2): qty 1

## 2011-07-22 MED ORDER — ALTEPLASE 100 MG IV SOLR
2.0000 mg | Freq: Once | INTRAVENOUS | Status: AC
  Administered 2011-07-22: 2 mg
  Filled 2011-07-22: qty 2

## 2011-07-22 NOTE — Progress Notes (Signed)
Subjective: The patient was seen on rounds today.  The patient was sitting in his bed quietly, watching television and had just finished his meal tray.  The patient is rating his pain at a 6/10 in his left flank area, left shoulder and diffuse back pain (worse in his lower back). Discussed with the patient about receiving another blood exchange today.  The patient had blood exchange x 2  yesterday and states that he feels better. Also let the patient know that his ammonia level is normal but we will continue the lactulose for now at a lower dose.  The patient has a wedding that he would like to attend next week and wants to make sure he is discharged in enough time to attend the wedding.  No other nursing or patient concerns.  Objective: Vital signs in last 24 hours: Blood pressure 135/75, pulse 59, temperature 97.6 F (36.4 C), temperature source Oral, resp. rate 18, height 6' (1.829 m), weight 187 lb 6.3 oz (85 kg), SpO2 100.00%.  General Appearance: Alert and oriented, cooperative, well developed, well nourished, mild distress Head: Normocephalic, without obvious abnormality, atraumatic Eyes: PERRLA, EOMI, the patient has slight strabismus, scleral icterus Nose: Nares normal, septum midline, mucosa normal, no drainage or sinus tenderness. Throat:  Normal lips, mucosa, tongue and gums Back: Symmetric, left CVA/flank tenderness, lower back tenderness Resp: CTA, no wheezes/rales/rhonchi Cardio: S1, S2 regular, no murmurs/clicks/rubs/gallops GI: Soft, non-tender, normoactive bowel sounds, no masses, no organomegaly Male Genitalia: Deferred, the patient denies priapism Extremities: Extremities normal, atraumatic, no cyanosis or edema, negative Homans sign, no sign of DVT, LUE tenderness Pulses: 2+ and symmetric Skin: Skin color, texture, turgor normal, no rashes or lesions, numerous tattoos Neurologic: Grossly normal, CN II-XII intact, no focal deficits Psych:  Appropriate affect  Lab  Results: Results for orders placed during the hospital encounter of 07/18/11 (from the past 24 hour(s))  PREPARE RBC (CROSSMATCH)     Status: Normal   Collection Time   07/21/11  2:30 PM      Component Value Range   Order Confirmation ORDER PROCESSED BY BLOOD BANK    AMMONIA     Status: Normal   Collection Time   07/22/11  7:00 AM      Component Value Range   Ammonia 54  11 - 60 umol/L  CBC     Status: Abnormal   Collection Time   07/22/11  7:00 AM      Component Value Range   WBC 15.5 (*) 4.0 - 10.5 K/uL   RBC 3.22 (*) 4.22 - 5.81 MIL/uL   Hemoglobin 9.5 (*) 13.0 - 17.0 g/dL   HCT 96.0 (*) 45.4 - 09.8 %   MCV 88.5  78.0 - 100.0 fL   MCH 29.5  26.0 - 34.0 pg   MCHC 33.3  30.0 - 36.0 g/dL   RDW 11.9 (*) 14.7 - 82.9 %   Platelets 364  150 - 400 K/uL  COMPREHENSIVE METABOLIC PANEL     Status: Abnormal   Collection Time   07/22/11  7:00 AM      Component Value Range   Sodium 138  135 - 145 mEq/L   Potassium 3.8  3.5 - 5.1 mEq/L   Chloride 106  96 - 112 mEq/L   CO2 23  19 - 32 mEq/L   Glucose, Bld 93  70 - 99 mg/dL   BUN 8  6 - 23 mg/dL   Creatinine, Ser 5.62  0.50 - 1.35 mg/dL   Calcium  8.9  8.4 - 10.5 mg/dL   Total Protein 6.9  6.0 - 8.3 g/dL   Albumin 3.7  3.5 - 5.2 g/dL   AST 44 (*) 0 - 37 U/L   ALT 33  0 - 53 U/L   Alkaline Phosphatase 117  39 - 117 U/L   Total Bilirubin 3.0 (*) 0.3 - 1.2 mg/dL   GFR calc non Af Amer >90  >90 mL/min   GFR calc Af Amer >90  >90 mL/min    Studies/Results: No results found. Medications:  Allergies  Allergen Reactions  . Morphine And Related Hives and Rash    Pt states he is able to tolerate Dilaudid with no reactions.    Current Facility-Administered Medications  Medication Dose Route Frequency Provider Last Rate Last Dose  . 0.45 % NaCl with KCl 20 mEq / L infusion   Intravenous Continuous Keitha Butte, NP 75 mL/hr at 07/21/11 1036    . acetaminophen (TYLENOL) tablet 650 mg  650 mg Oral Q4H PRN Keitha Butte, NP       . acetaminophen (TYLENOL) tablet 650 mg  650 mg Oral Once Keitha Butte, NP      . acidophilus (RISAQUAD) capsule 2 capsule  2 capsule Oral Daily Keitha Butte, NP   2 capsule at 07/22/11 1009  . azithromycin (ZITHROMAX) 500 mg in dextrose 5 % 250 mL IVPB  500 mg Intravenous Q24H Keitha Butte, NP      . deferoxamine (DESFERAL) 2,000 mg in dextrose 5 % 500 mL infusion  2,000 mg Intravenous QHS Keitha Butte, NP 125 mL/hr at 07/22/11 0042 2,000 mg at 07/22/11 0042  . diphenhydrAMINE (BENADRYL) capsule 25-50 mg  25-50 mg Oral Q4H PRN Gwenyth Bender, MD   25 mg at 07/18/11 1641   Or  . diphenhydrAMINE (BENADRYL) injection 12.5-25 mg  12.5-25 mg Intravenous Q4H PRN Gwenyth Bender, MD   25 mg at 07/22/11 1202  . diphenhydrAMINE (BENADRYL) injection 25 mg  25 mg Intravenous Once Keitha Butte, NP      . enoxaparin (LOVENOX) injection 40 mg  40 mg Subcutaneous Q24H Christine E Shade, PHARMD   40 mg at 07/22/11 0042  . fluconazole (DIFLUCAN) tablet 200 mg  200 mg Oral Daily Keitha Butte, NP   200 mg at 07/22/11 1010  . folic acid (FOLVITE) tablet 1 mg  1 mg Oral Daily Gwenyth Bender, MD   1 mg at 07/22/11 1010  . HYDROmorphone (DILAUDID) injection 2-4 mg  2-4 mg Intravenous Q2H Keitha Butte, NP   4 mg at 07/22/11 1202  . hydroxyurea (HYDREA) capsule 500 mg  500 mg Oral BID Keitha Butte, NP   500 mg at 07/22/11 1009  . ketorolac (TORADOL) 30 MG/ML injection 30 mg  30 mg Intravenous Q8H Keitha Butte, NP   30 mg at 07/22/11 0656  . lactulose (CHRONULAC) 10 GM/15ML solution 10 g  10 g Oral Daily Keitha Butte, NP   10 g at 07/22/11 1010  . levalbuterol (XOPENEX) nebulizer solution 0.63 mg  0.63 mg Nebulization BID Keitha Butte, NP   0.63 mg at 07/22/11 0803  . lidocaine (LIDODERM) 5 % 2 patch  2 patch Transdermal Q24H Keitha Butte, NP   2 patch at 07/22/11 1203  . OLANZapine (ZYPREXA) tablet 10 mg  10 mg Oral QHS Gwenyth Bender, MD   10 mg at  07/22/11 0043  . ondansetron (ZOFRAN) tablet 4  mg  4 mg Oral Q4H PRN Gwenyth Bender, MD   4 mg at 07/18/11 1641   Or  . ondansetron (ZOFRAN) injection 4 mg  4 mg Intravenous Q4H PRN Gwenyth Bender, MD   4 mg at 07/22/11 1203  . oxyCODONE (OXYCONTIN) 12 hr tablet 80 mg  80 mg Oral BID Keitha Butte, NP   80 mg at 07/22/11 1009  . pantoprazole (PROTONIX) EC tablet 40 mg  40 mg Oral Q0600 Keitha Butte, NP   40 mg at 07/22/11 0657  . polyethylene glycol (MIRALAX / GLYCOLAX) packet 17 g  17 g Oral Daily Keitha Butte, NP      . potassium chloride SA (K-DUR,KLOR-CON) CR tablet 20 mEq  20 mEq Oral Daily Keitha Butte, NP   20 mEq at 07/22/11 1009  . sodium chloride 0.9 % injection 10-40 mL  10-40 mL Intracatheter Q12H Gwenyth Bender, MD   10 mL at 07/22/11 1011  . sodium chloride 0.9 % injection 10-40 mL  10-40 mL Intracatheter PRN Gwenyth Bender, MD   70 mL at 07/21/11 1922  . zolpidem (AMBIEN) tablet 5 mg  5 mg Oral QHS PRN Keitha Butte, NP   5 mg at 07/22/11 0043  . DISCONTD: lactulose (CHRONULAC) 10 GM/15ML solution 30 g  30 g Oral Daily Gwenyth Bender, MD   30 g at 07/21/11 0933  . DISCONTD: levofloxacin (LEVAQUIN) IVPB 750 mg  750 mg Intravenous Q24H Keitha Butte, NP   750 mg at 07/21/11 1228  . DISCONTD: potassium chloride SA (K-DUR,KLOR-CON) CR tablet 20 mEq  20 mEq Oral BID Gwenyth Bender, MD   20 mEq at 07/21/11 1308   Assessment/Plan: Patient Active Problem List  Diagnosis  . Sickle cell pain crisis  . Leukocytosis  . Chronic pain  . Hypokalemia  . Metabolic acidosis  . Thrombocytosis  . Hemochromatosis  . Hypomagnesemia  . Acute chest syndrome due to sickle cell crisis  . Diarrhea  . Elevated brain natriuretic peptide (BNP) level  . Bronchitis  . Sickle cell anemia  . Acute embolism and thrombosis of right internal jugular vein  . Avascular necrosis  . Metabolic encephalopathy  . Increased ammonia level   Sickle Cell Crisis/Chronic Pain: The patient is in  active hemolysis and a blood exchange has been ordered. The patient is receiving pain/nausea/pruritis/bowel management, IV hydration, home medications and GI/DVT prophylaxis. CMP and CBC in the am.   (Possible) Acute chest syndrome: The patient is receiving antibiotic therapy with IV Azithromycin, nebulizer treatments are scheduled,  IS usage is encouraged.   Hypokalemia:  The patient is receiving KCL PO supplementation and IVFs with  - Will continue to monitor Hemochromatosis:  The patient will begin receiving nightly Desferal infusions - Ferritin level is pending Elevated Ammonia Level:  WNL - The patient will continue receiving a low dose of Lactulose daily   Discussed and agreed upon plan of care with the patient.  Plan of care will be adjusted based on the patient's clinical progress.   Larina Bras, NP-C 07/22/2011, 12:58 PM

## 2011-07-23 DIAGNOSIS — D5700 Hb-SS disease with crisis, unspecified: Secondary | ICD-10-CM | POA: Diagnosis not present

## 2011-07-23 LAB — TYPE AND SCREEN
Unit division: 0
Unit division: 0
Unit division: 0
Unit division: 0
Unit division: 0

## 2011-07-23 LAB — CBC
MCH: 29.5 pg (ref 26.0–34.0)
MCHC: 33.6 g/dL (ref 30.0–36.0)
RDW: 18.4 % — ABNORMAL HIGH (ref 11.5–15.5)

## 2011-07-23 LAB — TESTOSTERONE: Testosterone: 93.38 ng/dL — ABNORMAL LOW (ref 300–890)

## 2011-07-23 LAB — COMPREHENSIVE METABOLIC PANEL
ALT: 40 U/L (ref 0–53)
Albumin: 3.6 g/dL (ref 3.5–5.2)
Alkaline Phosphatase: 112 U/L (ref 39–117)
Calcium: 8.7 mg/dL (ref 8.4–10.5)
GFR calc Af Amer: >90 mL/min (ref >90)
Potassium: 4.1 mEq/L (ref 3.5–5.1)
Sodium: 137 mEq/L (ref 135–145)
Total Protein: 6.6 g/dL (ref 6.0–8.3)

## 2011-07-23 LAB — PREPARE RBC (CROSSMATCH)

## 2011-07-23 MED ORDER — HYDROMORPHONE HCL 4 MG PO TABS: 4.0000 mg | ORAL_TABLET | ORAL | Status: DC | PRN

## 2011-07-23 MED ORDER — OXYCODONE HCL 80 MG PO TB12: 80.0000 mg | ORAL_TABLET | Freq: Two times a day (BID) | ORAL | Status: DC

## 2011-07-23 MED ORDER — ZOLPIDEM TARTRATE 10 MG PO TABS: 10.0000 mg | ORAL_TABLET | Freq: Every evening | ORAL | Status: DC | PRN

## 2011-07-23 MED ORDER — AZITHROMYCIN 250 MG PO TABS: ORAL_TABLET | ORAL | Status: DC

## 2011-07-23 NOTE — Progress Notes (Signed)
Subjective:  Patient feeling better. Pain now at 5/10. He feels he can manage his pain at home. Would like to go home today.   Allergies  Allergen Reactions  . Morphine And Related Hives and Rash    Pt states he is able to tolerate Dilaudid with no reactions.   Current Facility-Administered Medications  Medication Dose Route Frequency Provider Last Rate Last Dose  . 0.45 % NaCl with KCl 20 mEq / L infusion   Intravenous Continuous Keitha Butte, NP 75 mL/hr at 07/23/11 6213    . acetaminophen (TYLENOL) tablet 650 mg  650 mg Oral Q4H PRN Keitha Butte, NP      . acetaminophen (TYLENOL) tablet 650 mg  650 mg Oral Once Keitha Butte, NP   650 mg at 07/22/11 2053  . acidophilus (RISAQUAD) capsule 2 capsule  2 capsule Oral Daily Keitha Butte, NP   2 capsule at 07/23/11 1004  . alteplase (ACTIVASE) injection 2 mg  2 mg Intracatheter Once Gwenyth Bender, MD   2 mg at 07/22/11 1747  . azithromycin (ZITHROMAX) 500 mg in dextrose 5 % 250 mL IVPB  500 mg Intravenous Q24H Keitha Butte, NP   500 mg at 07/23/11 0018  . deferoxamine (DESFERAL) 2,000 mg in dextrose 5 % 500 mL infusion  2,000 mg Intravenous QHS Keitha Butte, NP 125 mL/hr at 07/23/11 0018 2,000 mg at 07/23/11 0018  . diphenhydrAMINE (BENADRYL) capsule 25-50 mg  25-50 mg Oral Q4H PRN Gwenyth Bender, MD   25 mg at 07/18/11 1641   Or  . diphenhydrAMINE (BENADRYL) injection 12.5-25 mg  12.5-25 mg Intravenous Q4H PRN Gwenyth Bender, MD   25 mg at 07/23/11 1006  . diphenhydrAMINE (BENADRYL) injection 25 mg  25 mg Intravenous Once Keitha Butte, NP   25 mg at 07/22/11 2054  . enoxaparin (LOVENOX) injection 40 mg  40 mg Subcutaneous Q24H Winfield Rast, PHARMD   40 mg at 07/22/11 2312  . fluconazole (DIFLUCAN) tablet 200 mg  200 mg Oral Daily Keitha Butte, NP   200 mg at 07/23/11 1005  . folic acid (FOLVITE) tablet 1 mg  1 mg Oral Daily Gwenyth Bender, MD   1 mg at 07/23/11 1005  . HYDROmorphone (DILAUDID)  injection 2-4 mg  2-4 mg Intravenous Q2H Keitha Butte, NP   2 mg at 07/23/11 1007  . hydroxyurea (HYDREA) capsule 500 mg  500 mg Oral BID Keitha Butte, NP   500 mg at 07/23/11 1012  . ketorolac (TORADOL) 30 MG/ML injection 30 mg  30 mg Intravenous Q8H Keitha Butte, NP   30 mg at 07/23/11 0865  . lactulose (CHRONULAC) 10 GM/15ML solution 10 g  10 g Oral Daily Keitha Butte, NP   10 g at 07/23/11 1006  . levalbuterol (XOPENEX) nebulizer solution 0.63 mg  0.63 mg Nebulization BID Keitha Butte, NP   0.63 mg at 07/23/11 0756  . lidocaine (LIDODERM) 5 % 2 patch  2 patch Transdermal Q24H Keitha Butte, NP   2 patch at 07/22/11 1203  . OLANZapine (ZYPREXA) tablet 10 mg  10 mg Oral QHS Gwenyth Bender, MD   10 mg at 07/22/11 2312  . ondansetron (ZOFRAN) tablet 4 mg  4 mg Oral Q4H PRN Gwenyth Bender, MD   4 mg at 07/18/11 1641   Or  . ondansetron (ZOFRAN) injection 4 mg  4 mg Intravenous Q4H PRN Gwenyth Bender,  MD   4 mg at 07/23/11 1006  . oxyCODONE (OXYCONTIN) 12 hr tablet 80 mg  80 mg Oral BID Keitha Butte, NP   80 mg at 07/23/11 1005  . pantoprazole (PROTONIX) EC tablet 40 mg  40 mg Oral Q0600 Keitha Butte, NP   40 mg at 07/23/11 1610  . polyethylene glycol (MIRALAX / GLYCOLAX) packet 17 g  17 g Oral Daily Keitha Butte, NP      . potassium chloride SA (K-DUR,KLOR-CON) CR tablet 20 mEq  20 mEq Oral Daily Keitha Butte, NP   20 mEq at 07/23/11 1005  . sodium chloride 0.9 % injection 10-40 mL  10-40 mL Intracatheter Q12H Gwenyth Bender, MD   10 mL at 07/22/11 2200  . sodium chloride 0.9 % injection 10-40 mL  10-40 mL Intracatheter PRN Gwenyth Bender, MD   10 mL at 07/22/11 1745  . zolpidem (AMBIEN) tablet 5 mg  5 mg Oral QHS PRN Keitha Butte, NP   5 mg at 07/22/11 2317    Objective: Blood pressure 132/89, pulse 75, temperature 98.2 F (36.8 C), temperature source Oral, resp. rate 18, height 6' (1.829 m), weight 190 lb 14.7 oz (86.6 kg), SpO2  97.00%.  WDWN black male in no acue distress. HEENT:no sinus tenderness. NECK:no nodes. LUNGS:clear to auscultation. Tenderness in lower left rib margins, RU:EAVWUJ S1,S2. ABD:no tenderness. WJX:BJYNWGN right ankle tendernes. NEURO:intact.  Lab results: Results for orders placed during the hospital encounter of 07/18/11 (from the past 48 hour(s))  PREPARE RBC (CROSSMATCH)     Status: Normal   Collection Time   07/21/11  2:30 PM      Component Value Range Comment   Order Confirmation ORDER PROCESSED BY BLOOD BANK     AMMONIA     Status: Normal   Collection Time   07/22/11  7:00 AM      Component Value Range Comment   Ammonia 54  11 - 60 umol/L   FERRITIN     Status: Abnormal   Collection Time   07/22/11  7:00 AM      Component Value Range Comment   Ferritin 1982 (*) 22 - 322 ng/mL Result confirmed by automatic dilution.  CBC     Status: Abnormal   Collection Time   07/22/11  7:00 AM      Component Value Range Comment   WBC 15.5 (*) 4.0 - 10.5 K/uL    RBC 3.22 (*) 4.22 - 5.81 MIL/uL    Hemoglobin 9.5 (*) 13.0 - 17.0 g/dL    HCT 56.2 (*) 13.0 - 52.0 %    MCV 88.5  78.0 - 100.0 fL    MCH 29.5  26.0 - 34.0 pg    MCHC 33.3  30.0 - 36.0 g/dL    RDW 86.5 (*) 78.4 - 15.5 %    Platelets 364  150 - 400 K/uL   COMPREHENSIVE METABOLIC PANEL     Status: Abnormal   Collection Time   07/22/11  7:00 AM      Component Value Range Comment   Sodium 138  135 - 145 mEq/L    Potassium 3.8  3.5 - 5.1 mEq/L    Chloride 106  96 - 112 mEq/L    CO2 23  19 - 32 mEq/L    Glucose, Bld 93  70 - 99 mg/dL    BUN 8  6 - 23 mg/dL    Creatinine, Ser 6.96  0.50 - 1.35 mg/dL  Calcium 8.9  8.4 - 10.5 mg/dL    Total Protein 6.9  6.0 - 8.3 g/dL    Albumin 3.7  3.5 - 5.2 g/dL    AST 44 (*) 0 - 37 U/L    ALT 33  0 - 53 U/L    Alkaline Phosphatase 117  39 - 117 U/L    Total Bilirubin 3.0 (*) 0.3 - 1.2 mg/dL    GFR calc non Af Amer >90  >90 mL/min    GFR calc Af Amer >90  >90 mL/min   PREPARE RBC  (CROSSMATCH)     Status: Normal   Collection Time   07/22/11  1:30 PM      Component Value Range Comment   Order Confirmation ORDER PROCESSED BY BLOOD BANK     CBC     Status: Abnormal   Collection Time   07/23/11 10:00 AM      Component Value Range Comment   WBC 13.4 (*) 4.0 - 10.5 K/uL    RBC 3.22 (*) 4.22 - 5.81 MIL/uL    Hemoglobin 9.5 (*) 13.0 - 17.0 g/dL    HCT 45.4 (*) 09.8 - 52.0 %    MCV 87.9  78.0 - 100.0 fL    MCH 29.5  26.0 - 34.0 pg    MCHC 33.6  30.0 - 36.0 g/dL    RDW 11.9 (*) 14.7 - 15.5 %    Platelets 361  150 - 400 K/uL   COMPREHENSIVE METABOLIC PANEL     Status: Abnormal   Collection Time   07/23/11 10:00 AM      Component Value Range Comment   Sodium 137  135 - 145 mEq/L    Potassium 4.1  3.5 - 5.1 mEq/L    Chloride 105  96 - 112 mEq/L    CO2 22  19 - 32 mEq/L    Glucose, Bld 113 (*) 70 - 99 mg/dL    BUN 8  6 - 23 mg/dL    Creatinine, Ser 8.29  0.50 - 1.35 mg/dL    Calcium 8.7  8.4 - 56.2 mg/dL    Total Protein 6.6  6.0 - 8.3 g/dL    Albumin 3.6  3.5 - 5.2 g/dL    AST 46 (*) 0 - 37 U/L NO VISIBLE HEMOLYSIS   ALT 40  0 - 53 U/L    Alkaline Phosphatase 112  39 - 117 U/L    Total Bilirubin 2.4 (*) 0.3 - 1.2 mg/dL    GFR calc non Af Amer >90  >90 mL/min    GFR calc Af Amer >90  >90 mL/min     Studies/Results: No results found.  Patient Active Problem List  Diagnosis  . Sickle cell pain crisis  . Leukocytosis  . Chronic pain  . Hypokalemia  . Metabolic acidosis  . Thrombocytosis  . Hemochromatosis  . Hypomagnesemia  . Acute chest syndrome due to sickle cell crisis  . Diarrhea  . Elevated brain natriuretic peptide (BNP) level  . Bronchitis  . Sickle cell anemia  . Acute embolism and thrombosis of right internal jugular vein  . Avascular necrosis  . Metabolic encephalopathy  . Increased ammonia level    Impression: Resolving crisis. Acute chest syndrome. Erectile dysfunction. Patient mentions decreased drive for the past several  months.   Plan: Testosterone panel. Oral Zithromax. Discharge home with outpatient followup.   [name], Lilly Gasser 07/23/2011 10:56 AM

## 2011-07-25 LAB — TESTOSTERONE, % FREE: Testosterone-% Free: 1.7 % — ABNORMAL HIGH (ref 1.6–2.9)

## 2011-07-27 ENCOUNTER — Emergency Department (HOSPITAL_COMMUNITY): Payer: Medicare Other

## 2011-07-27 ENCOUNTER — Encounter (HOSPITAL_COMMUNITY): Payer: Self-pay | Admitting: *Deleted

## 2011-07-27 ENCOUNTER — Inpatient Hospital Stay (HOSPITAL_COMMUNITY)
Admission: EM | Admit: 2011-07-27 | Discharge: 2011-07-27 | DRG: 948 | Discharge status: Against Medical Advice | Payer: Medicare Other | Source: Ambulatory Visit | Attending: Emergency Medicine | Admitting: Emergency Medicine

## 2011-07-27 DIAGNOSIS — Z79899 Other long term (current) drug therapy: Secondary | ICD-10-CM

## 2011-07-27 DIAGNOSIS — M8700 Idiopathic aseptic necrosis of unspecified bone: Secondary | ICD-10-CM | POA: Diagnosis present

## 2011-07-27 DIAGNOSIS — R05 Cough: Secondary | ICD-10-CM | POA: Diagnosis not present

## 2011-07-27 DIAGNOSIS — D571 Sickle-cell disease without crisis: Secondary | ICD-10-CM | POA: Diagnosis present

## 2011-07-27 DIAGNOSIS — R509 Fever, unspecified: Secondary | ICD-10-CM

## 2011-07-27 DIAGNOSIS — I499 Cardiac arrhythmia, unspecified: Secondary | ICD-10-CM | POA: Diagnosis not present

## 2011-07-27 DIAGNOSIS — Z86711 Personal history of pulmonary embolism: Secondary | ICD-10-CM

## 2011-07-27 DIAGNOSIS — F39 Unspecified mood [affective] disorder: Secondary | ICD-10-CM | POA: Diagnosis present

## 2011-07-27 DIAGNOSIS — D72829 Elevated white blood cell count, unspecified: Secondary | ICD-10-CM | POA: Diagnosis present

## 2011-07-27 DIAGNOSIS — Z96649 Presence of unspecified artificial hip joint: Secondary | ICD-10-CM

## 2011-07-27 DIAGNOSIS — Z86718 Personal history of other venous thrombosis and embolism: Secondary | ICD-10-CM | POA: Diagnosis not present

## 2011-07-27 DIAGNOSIS — E876 Hypokalemia: Secondary | ICD-10-CM | POA: Diagnosis present

## 2011-07-27 DIAGNOSIS — R059 Cough, unspecified: Secondary | ICD-10-CM | POA: Diagnosis not present

## 2011-07-27 DIAGNOSIS — R079 Chest pain, unspecified: Secondary | ICD-10-CM | POA: Diagnosis not present

## 2011-07-27 DIAGNOSIS — R4182 Altered mental status, unspecified: Principal | ICD-10-CM | POA: Diagnosis present

## 2011-07-27 DIAGNOSIS — D473 Essential (hemorrhagic) thrombocythemia: Secondary | ICD-10-CM | POA: Diagnosis present

## 2011-07-27 DIAGNOSIS — Z87891 Personal history of nicotine dependence: Secondary | ICD-10-CM | POA: Diagnosis not present

## 2011-07-27 DIAGNOSIS — I6789 Other cerebrovascular disease: Secondary | ICD-10-CM | POA: Diagnosis not present

## 2011-07-27 LAB — CBC WITH DIFFERENTIAL/PLATELET
Basophils Absolute: 0.2 10*3/uL — ABNORMAL HIGH (ref 0.0–0.1)
Basophils Relative: 1 % (ref 0–1)
Eosinophils Absolute: 0.1 10*3/uL (ref 0.0–0.7)
Lymphs Abs: 1.7 10*3/uL (ref 0.7–4.0)
MCH: 29.7 pg (ref 26.0–34.0)
Neutrophils Relative %: 69 % (ref 43–77)
Platelets: 331 10*3/uL (ref 150–400)
RBC: 2.93 MIL/uL — ABNORMAL LOW (ref 4.22–5.81)

## 2011-07-27 LAB — BASIC METABOLIC PANEL
CO2: 18 mEq/L — ABNORMAL LOW (ref 19–32)
Glucose, Bld: 126 mg/dL — ABNORMAL HIGH (ref 70–99)
Potassium: 3.4 mEq/L — ABNORMAL LOW (ref 3.5–5.1)
Sodium: 137 mEq/L (ref 135–145)

## 2011-07-27 LAB — RAPID URINE DRUG SCREEN, HOSP PERFORMED
Amphetamines: NOT DETECTED
Barbiturates: NOT DETECTED
Benzodiazepines: NOT DETECTED
Cocaine: NOT DETECTED
Tetrahydrocannabinol: POSITIVE — AB

## 2011-07-27 LAB — AMMONIA: Ammonia: 75 umol/L — ABNORMAL HIGH (ref 11–60)

## 2011-07-27 LAB — URINALYSIS, ROUTINE W REFLEX MICROSCOPIC
Ketones, ur: NEGATIVE mg/dL
Leukocytes, UA: NEGATIVE
Nitrite: NEGATIVE
Specific Gravity, Urine: 1.009 (ref 1.005–1.030)
pH: 7.5 (ref 5.0–8.0)

## 2011-07-27 LAB — HEPATIC FUNCTION PANEL
AST: 59 U/L — ABNORMAL HIGH (ref 0–37)
Bilirubin, Direct: 0.4 mg/dL — ABNORMAL HIGH (ref 0.0–0.3)

## 2011-07-27 LAB — LACTIC ACID, PLASMA: Lactic Acid, Venous: 1.7 mmol/L (ref 0.5–2.2)

## 2011-07-27 MED ORDER — SODIUM CHLORIDE 0.9 % IV SOLN: INTRAVENOUS | Status: DC

## 2011-07-27 MED ORDER — SODIUM CHLORIDE 0.9 % IV BOLUS (SEPSIS)
1000.0000 mL | Freq: Once | INTRAVENOUS | Status: AC
  Administered 2011-07-27: 1000 mL via INTRAVENOUS

## 2011-07-27 MED ORDER — NALOXONE HCL 0.4 MG/ML IJ SOLN
0.4000 mg | Freq: Once | INTRAMUSCULAR | Status: AC
  Administered 2011-07-27: 0.4 mg via INTRAVENOUS
  Filled 2011-07-27: qty 1

## 2011-07-27 MED ORDER — ACETAMINOPHEN 500 MG PO TABS
1000.0000 mg | ORAL_TABLET | Freq: Once | ORAL | Status: AC
  Administered 2011-07-27: 975 mg via ORAL

## 2011-07-27 MED ORDER — DEXTROSE 5 % IV SOLN
1.0000 g | Freq: Once | INTRAVENOUS | Status: AC
  Administered 2011-07-27: 1 g via INTRAVENOUS
  Filled 2011-07-27: qty 10

## 2011-07-27 MED ORDER — MOXIFLOXACIN HCL 400 MG PO TABS: 400.0000 mg | ORAL_TABLET | Freq: Every day | ORAL | Status: AC

## 2011-07-27 MED ORDER — ACETAMINOPHEN 325 MG PO TABS
ORAL_TABLET | ORAL | Status: AC
  Filled 2011-07-27: qty 3

## 2011-07-27 MED ORDER — HYDROMORPHONE HCL PF 1 MG/ML IJ SOLN: 1.0000 mg | INTRAMUSCULAR | Status: DC | PRN

## 2011-07-27 MED ORDER — ONDANSETRON HCL 4 MG/2ML IJ SOLN: 4.0000 mg | Freq: Three times a day (TID) | INTRAMUSCULAR | Status: DC | PRN

## 2011-07-27 NOTE — ED Notes (Signed)
Pt left AMA; pt signed AMA for electronically and paper sheet; pt given discharge instructions; pt acknowledges risks of leaving against medical advice, up to and including death.

## 2011-07-27 NOTE — ED Notes (Signed)
Pt currently alert, confused as to why he is in the hospital. Pt resting, no complaints at this time.

## 2011-07-27 NOTE — ED Notes (Signed)
Pt transported to xray 

## 2011-07-27 NOTE — ED Provider Notes (Signed)
Medical screening examination/treatment/procedure(s) were conducted as a shared visit with non-physician practitioner(s) and myself.  I personally evaluated the patient during the encounter  See prior note, and attending note written by me  Ethelda Chick, MD 07/27/11 1430

## 2011-07-27 NOTE — ED Provider Notes (Addendum)
History     CSN: [id number]  Arrival date & time 07/27/11  1308   First MD Initiated Contact with Patient 07/27/11 [phone]      Chief Complaint  Patient presents with  . Altered Mental Status    (Consider location/radiation/quality/duration/timing/severity/associated sxs/prior treatment) HPI  31 year old male with history of sickle cell and history of PE was brought to the ED due to altered mental status. Per EMS note, this was found on the sidewalk stumbling and having slurred speech. Patient was not following commands initially. He was brought to ED for further evaluation. History initially did go to obtain due to patient appears obtunded. Due to his history of sickle cell anemia and currently taking multiple narcotic medication, patient was given narcan which he appears to become more alert afterward.  Per patient, he has been having pain to both side of his chest for the past 1-2 weeks. Pain is throbbing, constant, similar to prior sickle cell pain. He was seen by his doctor for the same and was given increase pain medication.  Sts he has taken two 80mg  oxycontin and 4mg  of dilaudid this AM prior to walking his dog.  Sts he does not know why he is in the hospital.  Pt endorse feeling feverish, but sts he did not have a fever prior to being in ER.  Does endorse occasional cough, that is non productive.  Denies hemoptysis.  Denies recent alcohol use.  Has been eating and drinking as usual.  Denies SOB.    Past Medical History  Diagnosis Date  . Sickle cell anemia   . Blood transfusion   . 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 Hypokalemia   . Mood disorder   . Pulmonary embolism   . Avascular necrosis   . Leukocytosis     Chronic  . Thrombocytosis     Chronic    Past Surgical History  Procedure Date  . Right hip replacement     08/2006  . Cholecystectomy     01/2008  . Porta cath placement   . Porta cath removal   . Umbilical hernia repair    01/2008  . Excision of left periauricular cyst     10/2009  . Excision of right ear lobe cyst with primary closur     11/2007    Family History  Problem Relation Age of Onset  . Sickle cell anemia Mother   . Sickle cell anemia Father   . Sickle cell trait Brother     History  Substance Use Topics  . Smoking status: Former Smoker -- 13 years    Types: Cigarettes    Quit date: 07/08/2010  . Smokeless tobacco: Never Used  . Alcohol Use: No      Review of Systems  Unable to perform ROS: Mental status change  Neurological: Positive for facial asymmetry.    Allergies  Morphine and related  Home Medications   Current Outpatient Rx  Name Route Sig Dispense Refill  . AZITHROMYCIN 250 MG PO TABS  One daily for 4 days 4 each 0  . FOLIC ACID 1 MG PO TABS Oral Take 1 tablet (1 mg total) by mouth daily. 60 tablet 2  . HYDROMORPHONE HCL 4 MG PO TABS Oral Take 1 tablet (4 mg total) by mouth every 4 (four) hours as needed for pain. For pain 60 tablet 0  . HYDROXYUREA 500 MG PO CAPS Oral Take 500 mg by mouth 2 (two) times daily. May  take with food to minimize GI side effects.    . IBUPROFEN 800 MG PO TABS Oral Take 1 tablet (800 mg total) by mouth every 8 (eight) hours as needed. For pain. 90 tablet 3  . OLANZAPINE 10 MG PO TABS Oral Take 1 tablet (10 mg total) by mouth at bedtime. 30 tablet 0  . OXYCODONE HCL ER 80 MG PO TB12 Oral Take 1 tablet (80 mg total) by mouth every 12 (twelve) hours. pain 30 tablet 0  . POTASSIUM CHLORIDE CRYS ER 20 MEQ PO TBCR Oral Take 1 tablet (20 mEq total) by mouth daily. 30 tablet 1  . ZOLPIDEM TARTRATE 10 MG PO TABS Oral Take 1 tablet (10 mg total) by mouth at bedtime as needed. For sleep. 30 tablet 1    BP 132/80  Pulse 132  Temp 101.2 F (38.4 C) (Rectal)  Resp 20  SpO2 95%  Physical Exam  Nursing note and vitals reviewed. Constitutional: He appears well-developed and well-nourished. He appears lethargic. No distress.       Diaphoretic,mildly  obtunded, responding to simple questions and to stimulant.    HENT:  Head: Atraumatic.  Eyes: Conjunctivae and EOM are normal. Pupils are equal, round, and reactive to light. Right eye exhibits no discharge. Left eye exhibits no discharge.       Pupil's reaction is sluggish, but reactive  Neck: Normal range of motion. Neck supple.  Cardiovascular: Regular rhythm.  Tachycardia present.   Pulmonary/Chest: Effort normal. No respiratory distress. He exhibits tenderness. He exhibits no crepitus, no edema, no deformity and no swelling.  Abdominal: Soft. There is no tenderness. There is no rebound.  Musculoskeletal: He exhibits no tenderness.       ROM appears intact, no obvious focal weakness  Neurological: He has normal strength. He appears lethargic. No cranial nerve deficit or sensory deficit. GCS eye subscore is 3. GCS verbal subscore is 5. GCS motor subscore is 6.  Skin: Skin is warm and dry. No rash noted.  Psychiatric: He has a normal mood and affect.    ED Course  Procedures (including critical care time)  Labs Reviewed - No data to display No results found.  CRITICAL CARE Performed by: Fayrene Helper   Total critical care time: 30 min  Critical care time was exclusive of separately billable procedures and treating other patients.  Critical care was necessary to treat or prevent imminent or life-threatening deterioration.  Critical care was time spent personally by me on the following activities: development of treatment plan with patient and/or surrogate as well as nursing, discussions with consultants, evaluation of patient's response to treatment, examination of patient, obtaining history from patient or surrogate, ordering and performing treatments and interventions, ordering and review of laboratory studies, ordering and review of radiographic studies, pulse oximetry and re-evaluation of patient's condition.   Results for orders placed during the hospital encounter of 07/27/11    BASIC METABOLIC PANEL      Component Value Range   Sodium 137  135 - 145 mEq/L   Potassium 3.4 (*) 3.5 - 5.1 mEq/L   Chloride 105  96 - 112 mEq/L   CO2 18 (*) 19 - 32 mEq/L   Glucose, Bld 126 (*) 70 - 99 mg/dL   BUN 11  6 - 23 mg/dL   Creatinine, Ser 1.61  0.50 - 1.35 mg/dL   Calcium 9.2  8.4 - 09.6 mg/dL   GFR calc non Af Amer >90  >90 mL/min   GFR calc Af Amer >90  >  90 mL/min  GLUCOSE, CAPILLARY      Component Value Range   Glucose-Capillary 132 (*) 70 - 99 mg/dL  LACTIC ACID, PLASMA      Component Value Range   Lactic Acid, Venous 1.7  0.5 - 2.2 mmol/L  PROCALCITONIN      Component Value Range   Procalcitonin <0.10    CBC WITH DIFFERENTIAL      Component Value Range   WBC 16.5 (*) 4.0 - 10.5 K/uL   RBC 2.93 (*) 4.22 - 5.81 MIL/uL   Hemoglobin 8.7 (*) 13.0 - 17.0 g/dL   HCT 60.4 (*) 54.0 - 98.1 %   MCV 88.1  78.0 - 100.0 fL   MCH 29.7  26.0 - 34.0 pg   MCHC 33.7  30.0 - 36.0 g/dL   RDW 19.1 (*) 47.8 - 29.5 %   Platelets 331  150 - 400 K/uL   Neutrophils Relative 69  43 - 77 %   Neutro Abs 11.4 (*) 1.7 - 7.7 K/uL   Lymphocytes Relative 10 (*) 12 - 46 %   Lymphs Abs 1.7  0.7 - 4.0 K/uL   Monocytes Relative 19 (*) 3 - 12 %   Monocytes Absolute 3.1 (*) 0.1 - 1.0 K/uL   Eosinophils Relative 1  0 - 5 %   Eosinophils Absolute 0.1  0.0 - 0.7 K/uL   Basophils Relative 1  0 - 1 %   Basophils Absolute 0.2 (*) 0.0 - 0.1 K/uL   Dg Chest 2 View  07/27/2011  *RADIOLOGY REPORT*  Clinical Data: Sickle cell disease.  Chest pain, fever and cough.  CHEST - 2 VIEW  Comparison: Chest x-ray 07/19/2011.  Findings: Lung volumes are normal.  Previously noted right upper extremity PICC has been removed.  No acute consolidative air space disease.  No definite pleural effusions.  Mild pulmonary venous congestion without frank pulmonary edema.  Heart size is borderline enlarged.  Mediastinal contours are unremarkable.  IMPRESSION: 1.  Mild pulmonary venous congestion without frank pulmonary edema.  No other radiographic evidence of acute cardiopulmonary disease.  Original Report Authenticated By: Florencia Reasons, M.D.   Dg Chest 2 View  07/19/2011  *RADIOLOGY REPORT*  Clinical Data: Sickle cell crisis  CHEST - 2 VIEW  Comparison: 07/07/2011; 06/22/2011; 05/30/2011; chest CT - 04/25/2011  Findings: Grossly unchanged cardiac silhouette and mediastinal contours.  Stable positioning of support apparatus.  There is mild diffuse increased conspicuity of the pulmonary interstitium.  No new focal airspace opacities.  No pleural effusion or pneumothorax. Grossly unchanged bones.  Post cholecystectomy.  IMPRESSION: Chronic interstitial/bronchitic change without acute cardiopulmonary disease.  Original Report Authenticated By: Waynard Reeds, M.D.   Dg Chest 2 View  07/07/2011  *RADIOLOGY REPORT*  Clinical Data: 31 year old male with left side chest pain.  History of sickle cell disease.  CHEST - 2 VIEW  Comparison: 06/22/2011 and earlier.  Findings: Right PICC line in place. Stable cardiomegaly and mediastinal contours.  Visualized tracheal air column is within normal limits.  Chronic increased interstitial markings are stable. No pneumothorax, overt edema, pleural effusion or acute pulmonary opacity. No acute osseous abnormality identified.  IMPRESSION: No acute cardiopulmonary abnormality.  Original Report Authenticated By: Ulla Potash III, M.D.    1. AMS 2. Leukocytosis 3. Fever 4. Elevated ammonia level   MDM  Sickle cell pt presents with AMS.  Pt becomes more alert after given narcan.  He is tachycardic and diaphoretic.  Has a fever of 101.  Acetaminophen given.  Work up initiated.  Discussed with my attending.    12:41 PM This is a sickle cell patient with fever, leukocytosis, and altered mental status. Rocephin given via IV.  Will have pt admitted for further care.  12:55 PM I have consulted with [facility], who agrees to admit pt to Floor [address], Team 3.    2:15 PM Patient  requests to be discharged. Both the [facility] and myself has tried multiple attempts to convince him to stay however he uses Tuesday. Patient agrees to sign AMA form. He is alert and oriented and appropriate to sign AMA form. I recommend for patient to return if anything changes. I will prescribe Avelox. Patient voiced understanding and agrees with plan.  Fayrene Helper, PA-C 07/27/11 1417

## 2011-07-27 NOTE — ED Notes (Signed)
Pt states that he has to go home at 2pm today. Archie Endo informed and is currently at bedside with pt.

## 2011-07-27 NOTE — ED Notes (Signed)
Patient is still in xray.  Will request urine sample on their return.

## 2011-07-27 NOTE — ED Provider Notes (Signed)
Medical screening examination/treatment/procedure(s) were conducted as a shared visit with non-physician practitioner(s) and myself.  I personally evaluated the patient during the encounter  Pt seen and examined- decreased level of consciousness- improved after narcan administration.  Pt with sickle cell, fever, leukocytosis.  Pt treated with IV hydration, started on rocephin.  [facility] consulted for admission- they have seen patient in the ED.  Despite multiple attempts to convince him otherwise, pt has decided to leave AMA.  After initial dose of narcan he is awake/alert/answering questions appropriately.  Adamantly does not want admission to the hospital.    Ethelda Chick, MD 07/27/11 [phone]

## 2011-07-27 NOTE — Discharge Instructions (Signed)
Date: 07/27/2011 Patient: [name] Admitted: 07/27/2011  9:36 AM Attending Provider: Ethelda Chick, MD  [name] or his authorized caregiver has made the decision for the patient to leave the emergency department against the advice of Ethelda Chick, MD.  He or his authorized caregiver has been informed and understands the inherent risks, including death.  He or his authorized caregiver has decided to accept the responsibility for this decision. [name] and all necessary parties have been advised that he may return for further evaluation or treatment. His condition at time of discharge was Stable.  [name] had current vital signs as follows:  Blood pressure 141/91, pulse 95, temperature 98.1 F (36.7 C), temperature source Oral, resp. rate 19, SpO2 99.00%.   [name] or his authorized caregiver has signed the Leaving Against Medical Advice form prior to leaving the department.  [name] 07/27/2011     Altered Mental Status Altered mental status most often refers to an abnormal change in your responsiveness and awareness. It can affect your speech, thought, mobility, memory, attention span, or alertness. It can range from slight confusion to complete unresponsiveness (coma). Altered mental status can be a sign of a serious underlying medical condition. Rapid evaluation and medical treatment is necessary for patients having an altered mental status. CAUSES   Low blood sugar (hypoglycemia) or diabetes.   Severe loss of body fluids (dehydration) or a body salt (electrolyte) imbalance.   A stroke or other neurologic problem, such as dementia or delirium.   A head injury or tumor.   A drug or alcohol overdose.   Exposure to toxins or poisons.   Depression, anxiety, and stress.   A low oxygen level (hypoxia).   An infection.   Blood loss.   Twitching or shaking (seizure).   Heart problems, such as heart attack or heart rhythm  problems (arrhythmias).   A body temperature that is too low or too high (hypothermia or hyperthermia).  DIAGNOSIS  A diagnosis is based on your history, symptoms, physical and neurologic examinations, and diagnostic tests. Diagnostic tests may include:  Measurement of your blood pressure, pulse, breathing, and oxygen levels (vital signs).   Blood tests.   Urine tests.   X-ray exams.   A computerized magnetic scan (magnetic resonance imaging, MRI).   A computerized X-ray scan (computed tomography, CT scan).  TREATMENT  Treatment will depend on the cause. Treatment may include:  Management of an underlying medical or mental health condition.   Critical care or support in the hospital.  HOME CARE INSTRUCTIONS   Only take over-the-counter or prescription medicines for pain, discomfort, or fever as directed by your caregiver.   Manage underlying conditions as directed by your caregiver.   Eat a healthy, well-balanced diet to maintain strength.   Join a support group or prevention program to cope with the condition or trauma that caused the altered mental status. Ask your caregiver to help choose a program that works for you.   Follow up with your caregiver for further examination, therapy, or testing as directed.  SEEK MEDICAL CARE IF:   You feel unwell or have chills.   You or your family notice a change in your behavior or your alertness.   You have trouble following your caregiver's treatment plan.   You have questions or concerns.  SEEK IMMEDIATE MEDICAL CARE IF:   You have a rapid heartbeat or have chest pain.   You have difficulty breathing.  You have a fever.   You have a headache with a stiff neck.   You cough up blood.   You have blood in your urine or stool.   You have severe agitation or confusion.  MAKE SURE YOU:   Understand these instructions.   Will watch your condition.   Will get help right away if you are not doing well or get worse.    Document Released: 07/07/2009 Document Revised: 01/06/2011 Document Reviewed: 07/07/2009 [facility] [employer] Patient Information 2012 [place], [place].

## 2011-07-27 NOTE — ED Notes (Signed)
Pt requesting pain medication. Archie Endo, PA notified and stated that he wanted labs and tests to get back before ordering anything else. Pt notified.

## 2011-07-27 NOTE — ED Notes (Signed)
Per EMS pt found out on sidewalk, stumbling while walking dog. Pt has slurred speech. Pupils 3, sluggish. No droop noted. Pt not following commands. BG 157. Vital signs 121/82 HR 130.

## 2011-07-29 LAB — URINE CULTURE: Colony Count: NO GROWTH

## 2011-08-02 LAB — CULTURE, BLOOD (ROUTINE X 2)
Culture: NO GROWTH
Culture: NO GROWTH

## 2011-08-13 ENCOUNTER — Encounter (HOSPITAL_COMMUNITY): Payer: Self-pay | Admitting: Hematology

## 2011-08-13 ENCOUNTER — Inpatient Hospital Stay (HOSPITAL_COMMUNITY)
Admission: AD | Admit: 2011-08-13 | Discharge: 2011-08-21 | DRG: 811 | Disposition: A | Discharge status: Home / Self Care | Payer: Medicare Other | Attending: Internal Medicine | Admitting: Internal Medicine

## 2011-08-13 DIAGNOSIS — J189 Pneumonia, unspecified organism: Secondary | ICD-10-CM | POA: Diagnosis present

## 2011-08-13 DIAGNOSIS — M25519 Pain in unspecified shoulder: Secondary | ICD-10-CM | POA: Diagnosis not present

## 2011-08-13 DIAGNOSIS — D72829 Elevated white blood cell count, unspecified: Secondary | ICD-10-CM | POA: Diagnosis present

## 2011-08-13 DIAGNOSIS — Z86711 Personal history of pulmonary embolism: Secondary | ICD-10-CM | POA: Diagnosis not present

## 2011-08-13 DIAGNOSIS — J9819 Other pulmonary collapse: Secondary | ICD-10-CM | POA: Diagnosis present

## 2011-08-13 DIAGNOSIS — M87029 Idiopathic aseptic necrosis of unspecified humerus: Secondary | ICD-10-CM | POA: Diagnosis present

## 2011-08-13 DIAGNOSIS — E876 Hypokalemia: Secondary | ICD-10-CM | POA: Diagnosis not present

## 2011-08-13 DIAGNOSIS — E291 Testicular hypofunction: Secondary | ICD-10-CM | POA: Diagnosis present

## 2011-08-13 DIAGNOSIS — D5701 Hb-SS disease with acute chest syndrome: Secondary | ICD-10-CM | POA: Diagnosis present

## 2011-08-13 DIAGNOSIS — D5700 Hb-SS disease with crisis, unspecified: Secondary | ICD-10-CM | POA: Diagnosis not present

## 2011-08-13 DIAGNOSIS — J40 Bronchitis, not specified as acute or chronic: Secondary | ICD-10-CM

## 2011-08-13 DIAGNOSIS — I82C11 Acute embolism and thrombosis of right internal jugular vein: Secondary | ICD-10-CM

## 2011-08-13 DIAGNOSIS — M8700 Idiopathic aseptic necrosis of unspecified bone: Secondary | ICD-10-CM

## 2011-08-13 DIAGNOSIS — D571 Sickle-cell disease without crisis: Secondary | ICD-10-CM

## 2011-08-13 DIAGNOSIS — J984 Other disorders of lung: Secondary | ICD-10-CM | POA: Diagnosis not present

## 2011-08-13 LAB — DIFFERENTIAL
Basophils Absolute: 0.1 10*3/uL (ref 0.0–0.1)
Basophils Relative: 0 % (ref 0–1)
Eosinophils Absolute: 0.2 10*3/uL (ref 0.0–0.7)
Eosinophils Relative: 1 % (ref 0–5)
Monocytes Absolute: 1.7 10*3/uL — ABNORMAL HIGH (ref 0.1–1.0)

## 2011-08-13 LAB — COMPREHENSIVE METABOLIC PANEL
ALT: 34 U/L (ref 0–53)
AST: 30 U/L (ref 0–37)
Albumin: 4 g/dL (ref 3.5–5.2)
Alkaline Phosphatase: 118 U/L — ABNORMAL HIGH (ref 39–117)
BUN: 4 mg/dL — ABNORMAL LOW (ref 6–23)
Chloride: 102 mEq/L (ref 96–112)
Potassium: 3.4 mEq/L — ABNORMAL LOW (ref 3.5–5.1)
Sodium: 135 mEq/L (ref 135–145)
Total Bilirubin: 3.1 mg/dL — ABNORMAL HIGH (ref 0.3–1.2)

## 2011-08-13 LAB — CBC
HCT: 30.9 % — ABNORMAL LOW (ref 39.0–52.0)
MCH: 30 pg (ref 26.0–34.0)
MCHC: 34.3 g/dL (ref 30.0–36.0)
MCV: 87.5 fL (ref 78.0–100.0)
Platelets: 542 10*3/uL — ABNORMAL HIGH (ref 150–400)
RDW: 18.2 % — ABNORMAL HIGH (ref 11.5–15.5)
WBC: 23.1 10*3/uL — ABNORMAL HIGH (ref 4.0–10.5)

## 2011-08-13 MED ORDER — ZOLPIDEM TARTRATE 10 MG PO TABS
10.0000 mg | ORAL_TABLET | Freq: Every evening | ORAL | Status: DC | PRN
  Administered 2011-08-14 – 2011-08-17 (×4): 10 mg via ORAL
  Filled 2011-08-13 (×4): qty 1

## 2011-08-13 MED ORDER — ONDANSETRON HCL 4 MG/2ML IJ SOLN
4.0000 mg | INTRAMUSCULAR | Status: DC | PRN
  Administered 2011-08-13 – 2011-08-21 (×46): 4 mg via INTRAVENOUS
  Filled 2011-08-13 (×47): qty 2

## 2011-08-13 MED ORDER — ONDANSETRON HCL 4 MG PO TABS: 4.0000 mg | ORAL_TABLET | ORAL | Status: DC | PRN

## 2011-08-13 MED ORDER — FOLIC ACID 1 MG PO TABS
1.0000 mg | ORAL_TABLET | Freq: Every day | ORAL | Status: DC
  Administered 2011-08-13 – 2011-08-21 (×9): 1 mg via ORAL
  Filled 2011-08-13 (×9): qty 1

## 2011-08-13 MED ORDER — OXYCODONE HCL 40 MG PO TB12
80.0000 mg | ORAL_TABLET | Freq: Two times a day (BID) | ORAL | Status: DC
  Administered 2011-08-13 – 2011-08-21 (×16): 80 mg via ORAL
  Filled 2011-08-13 (×16): qty 2

## 2011-08-13 MED ORDER — DEXTROSE 5 % IV SOLN
1.0000 g | INTRAVENOUS | Status: DC
  Administered 2011-08-13 – 2011-08-20 (×8): 1 g via INTRAVENOUS
  Filled 2011-08-13 (×9): qty 10

## 2011-08-13 MED ORDER — HYDROXYUREA 500 MG PO CAPS
500.0000 mg | ORAL_CAPSULE | Freq: Two times a day (BID) | ORAL | Status: DC
  Administered 2011-08-13 – 2011-08-21 (×16): 500 mg via ORAL
  Filled 2011-08-13 (×17): qty 1

## 2011-08-13 MED ORDER — DIPHENHYDRAMINE HCL 25 MG PO CAPS: 25.0000 mg | ORAL_CAPSULE | ORAL | Status: DC | PRN

## 2011-08-13 MED ORDER — POTASSIUM CHLORIDE IN NACL 20-0.45 MEQ/L-% IV SOLN
INTRAVENOUS | Status: DC
  Administered 2011-08-13 – 2011-08-21 (×16): via INTRAVENOUS
  Filled 2011-08-13 (×22): qty 1000

## 2011-08-13 MED ORDER — DEXTROSE-NACL 5-0.45 % IV SOLN
INTRAVENOUS | Status: DC
  Administered 2011-08-13: 14:00:00 via INTRAVENOUS

## 2011-08-13 MED ORDER — HYDROMORPHONE HCL PF 2 MG/ML IJ SOLN
2.0000 mg | INTRAMUSCULAR | Status: DC | PRN
  Administered 2011-08-13 – 2011-08-14 (×6): 4 mg via INTRAVENOUS
  Filled 2011-08-13 (×6): qty 2

## 2011-08-13 MED ORDER — IBUPROFEN 800 MG PO TABS
800.0000 mg | ORAL_TABLET | Freq: Three times a day (TID) | ORAL | Status: DC
  Administered 2011-08-13 – 2011-08-21 (×24): 800 mg via ORAL
  Filled 2011-08-13 (×30): qty 1

## 2011-08-13 MED ORDER — POTASSIUM CHLORIDE CRYS ER 20 MEQ PO TBCR
20.0000 meq | EXTENDED_RELEASE_TABLET | Freq: Every day | ORAL | Status: DC
  Administered 2011-08-13 – 2011-08-21 (×9): 20 meq via ORAL
  Filled 2011-08-13 (×9): qty 1

## 2011-08-13 MED ORDER — DIPHENHYDRAMINE HCL 50 MG/ML IJ SOLN
12.5000 mg | INTRAMUSCULAR | Status: DC | PRN
Start: 2011-08-13 — End: 2011-08-21
  Administered 2011-08-13 (×2): 25 mg via INTRAVENOUS
  Administered 2011-08-13 – 2011-08-14 (×7): 12.5 mg via INTRAVENOUS
  Administered 2011-08-15: 25 mg via INTRAVENOUS
  Administered 2011-08-15 (×5): 12.5 mg via INTRAVENOUS
  Administered 2011-08-16 – 2011-08-20 (×25): 25 mg via INTRAVENOUS
  Administered 2011-08-20 – 2011-08-21 (×5): 12.5 mg via INTRAVENOUS
  Filled 2011-08-13 (×47): qty 1

## 2011-08-13 MED ORDER — OLANZAPINE 10 MG PO TABS
10.0000 mg | ORAL_TABLET | Freq: Every day | ORAL | Status: DC
  Administered 2011-08-13 – 2011-08-20 (×8): 10 mg via ORAL
  Filled 2011-08-13 (×9): qty 1

## 2011-08-13 NOTE — H&P (Signed)
Patient ID: [name], male   DOB: Sep 09, 1980, 31 y.o.   MRN: [id number]  No chief complaint on file.   HPI [name] is a 31 y.o. male.  Patient comes to the [facility] complaining of increasing arthralgias for the past two days. Pain started in his left lower ribs then spread to the right. He noted a right sided sore throat one day prior to this. He denies fever,chills or night sweats. He continues to take his medications consistently. Patient is not smoking or drinking. Pain today is an 8/10.   Past Medical History  Diagnosis Date  . Sickle cell anemia   . Blood transfusion   . 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 Hypokalemia   . Mood disorder   . Pulmonary embolism   . Avascular necrosis   . Leukocytosis     Chronic  . Thrombocytosis     Chronic    Past Surgical History  Procedure Date  . Right hip replacement     08/2006  . Cholecystectomy     01/2008  . Porta cath placement   . Porta cath removal   . Umbilical hernia repair     01/2008  . Excision of left periauricular cyst     10/2009  . Excision of right ear lobe cyst with primary closur     11/2007    Family History  Problem Relation Age of Onset  . Sickle cell anemia Mother   . Sickle cell anemia Father   . Sickle cell trait Brother     Social History History  Substance Use Topics  . Smoking status: Former Smoker -- 13 years    Types: Cigarettes    Quit date: 07/08/2010  . Smokeless tobacco: Never Used  . Alcohol Use: No    Allergies  Allergen Reactions  . Morphine And Related Hives and Rash    Pt states he is able to tolerate Dilaudid with no reactions.    Current Facility-Administered Medications  Medication Dose Route Frequency Provider Last Rate Last Dose  . dextrose 5 %-0.45 % sodium chloride infusion   Intravenous Continuous Gwenyth Bender, MD      . diphenhydrAMINE (BENADRYL) capsule 25-50 mg  25-50 mg Oral Q4H PRN Gwenyth Bender, MD       Or  .  diphenhydrAMINE (BENADRYL) injection 12.5-25 mg  12.5-25 mg Intravenous Q4H PRN Gwenyth Bender, MD      . folic acid (FOLVITE) tablet 1 mg  1 mg Oral Daily Gwenyth Bender, MD      . HYDROmorphone (DILAUDID) injection 2-4 mg  2-4 mg Intravenous Q2H PRN Gwenyth Bender, MD      . ondansetron [facility]) tablet 4 mg  4 mg Oral Q4H PRN Gwenyth Bender, MD       Or  . ondansetron [facility]) injection 4 mg  4 mg Intravenous Q4H PRN Gwenyth Bender, MD        Review of Systems Occasional diplopia with drifting of his right eye. No other new complaints.  Blood pressure 120/73, pulse 86, temperature 98.5 F (36.9 C), temperature source Oral, resp. rate 18, SpO2 100.00%.  Physical Exam WDWN black male in mild distress. HEENT: no sinus tenderness. Scleral icterus. Right TM with cerumen. Left is clear. Mild posterior pharyngeal erythema. NECK: no posteriorcervical nodes. LUNGS. Clear with decreased breath sounds in left base. No vocal fremitus or CVAT. CV: normal S1, S2 without S3. Tender lower  costal margins. ABD: no epigastric tenderness. MSK : tender lower ribcage bilaterally. No crepitation. NEURO: nofocal, intact.   Data Reviewed K+ 3.4. Total bilirubin 3.1.  Assessment    Sickle cell crisis with active hemolysis. Leukocytosis. Left sided chest pain. Rule out early Acute chest Syndrome. Mild hypokalemia. History of pulmonary embolism Avascular necrosis of the shoulders.    Plan    IV fluids with potassium replacement. IV dilaudid for pain control. IV zofran, IV benadryl as needed. Empiric antibiotics. Transfer to hospital if no significant improvement.       [name] 08/13/2011, 12:22 PM

## 2011-08-14 DIAGNOSIS — D5700 Hb-SS disease with crisis, unspecified: Secondary | ICD-10-CM | POA: Diagnosis not present

## 2011-08-14 MED ORDER — POTASSIUM CHLORIDE CRYS ER 20 MEQ PO TBCR
40.0000 meq | EXTENDED_RELEASE_TABLET | Freq: Once | ORAL | Status: AC
  Administered 2011-08-14: 40 meq via ORAL
  Filled 2011-08-14: qty 2

## 2011-08-14 MED ORDER — HYDROMORPHONE HCL PF 4 MG/ML IJ SOLN
2.0000 mg | INTRAMUSCULAR | Status: DC | PRN
  Administered 2011-08-14 – 2011-08-21 (×82): 4 mg via INTRAVENOUS
  Filled 2011-08-14 (×83): qty 1

## 2011-08-14 NOTE — Discharge Summary (Signed)
[facility] Discharge Summary  Patient ID: [name] MRN: [id number] DOB/AGE: Mar 30, 1980 31 y.o.  Admit date: 06/22/2011 Discharge date: 06/23/2011  Admission Diagnoses:  Discharge Diagnoses:  Acute sickle cell crisis Leukocytosis Noncardiac chest pain History of acute chest syndrome  Discharged Condition: improved Clinic Course: patient admitted to the sickle cell unit for further treatment. He was placed on IV fluids with half-normal saline at 125 cc an hour. Patient was given Dilaudid 4 mg q.2 hours p.r.n. Severe pain. He was treated for an nausea and pruritus as needed with Zofran and Benadryl respectively. He was also given empiric antibiotics as well. Patient was maintained on incentive spirometry. His home medications was continued. All the subsequent 23 hours patient improved considerably. At end of his stay he felt he could manage his pain at home.   Discharge Vital Signs: Blood pressure 114/63, pulse 71, temperature 98.2 F (36.8 C), temperature source Oral, resp. rate 18, height 6' (1.829 m), weight 165 lb (74.844 kg), SpO2 99.00%.   Disposition:   Discharge Orders    Future Orders Please Complete By Expires   DME Nebulizer machine      Increase activity slowly      Discharge instructions      Comments:   Take all medications as prescribed Finish entire course of antibiotic therapy Keep warm Drink plenty of water Get plenty of rest Use nebulizer treatments when you feel chest/rib pain or shortness of breath Use incentive spirometer daily Keep all follow-up appointments   Call MD for:  severe uncontrolled pain         Follow-up Information    Follow up with [name], MD. Schedule an appointment as soon as possible for a visit in 1 week. (Go to ER or return to [facility] if symptoms worsen)    Contact information:   [address] [place] [address] [phone]        Discharge  Medications: Dilaudid 4 mg q.4 hours p.r.n. Pain Zyprexa 10 mg q.h.s. Oxycodone 80 mg q.12 hours Zolpidem 10 mg q.h.s. P.r.n. Sleep Hydroxyurea 1000 mg daily Potassium chloride 20 mg daily     Signed: [name], Ashantae Pangallo 06/23/2011, 8:30 PM

## 2011-08-14 NOTE — Progress Notes (Signed)
Subjective:  Patient complains of back rib and leg pains grade 8/10. Denies any cough fever chills denies any urinary complaints  Objective:  Vital Signs in the last 24 hours: Temp:  [98.3 F (36.8 C)-98.6 F (37 C)] 98.3 F (36.8 C) (07/14 1000) Pulse Rate:  [63-74] 74  (07/14 1000) Resp:  [16-18] 18  (07/14 1000) BP: (106-118)/(66-69) 118/66 mmHg (07/14 1000) SpO2:  [98 %-100 %] 99 % (07/14 1000) Weight:  [83.5 kg (184 lb 1.4 oz)-84.4 kg (186 lb 1.1 oz)] 84.4 kg (186 lb 1.1 oz) (07/14 0534)  Intake/Output from previous day: 07/13 0701 - 07/14 0700 In: 716.7 [I.V.:716.7] Out: -  Intake/Output from this shift:    Physical Exam: Neck: no adenopathy, no carotid bruit, no JVD and supple, symmetrical, trachea midline Lungs: clear to auscultation bilaterally Heart: regular rate and rhythm, S1, S2 normal, no murmur, click, rub or gallop Abdomen: soft, non-tender; bowel sounds normal; no masses,  no organomegaly Extremities: extremities normal, atraumatic, no cyanosis or edema  Lab Results:  Basename 08/13/11 1122  WBC 23.1*  HGB 10.6*  PLT 542*    Basename 08/13/11 1122  NA 135  K 3.4*  CL 102  CO2 22  GLUCOSE 109*  BUN 4*  CREATININE 0.45*   No results found for this basename: TROPONINI:2,CK,MB:2 in the last 72 hours Hepatic Function Panel  Basename 08/13/11 1122  PROT 7.8  ALBUMIN 4.0  AST 30  ALT 34  ALKPHOS 118*  BILITOT 3.1*  BILIDIR --  IBILI --   No results found for this basename: CHOL in the last 72 hours No results found for this basename: PROTIME in the last 72 hours  Imaging: Imaging results have been reviewed and No results found.  Cardiac Studies:  Assessment/Plan:  Acute sickle cell crisis with active moniliasis Marked leukocytosis Hypokalemia History of PE Avascular necrosis of shoulders Plan Continue present management Replace K. Check labs in a.m.  LOS: 1 day    [name] N 08/14/2011, 12:22 PM

## 2011-08-14 NOTE — Plan of Care (Signed)
Problem: Phase I Progression Outcomes Goal: Pt. aware of pain management plan Outcome: Progressing Discussed pain control medications available prn. Admitted understanding

## 2011-08-15 ENCOUNTER — Inpatient Hospital Stay (HOSPITAL_COMMUNITY): Payer: Medicare Other

## 2011-08-15 DIAGNOSIS — D5700 Hb-SS disease with crisis, unspecified: Secondary | ICD-10-CM | POA: Diagnosis not present

## 2011-08-15 DIAGNOSIS — D5701 Hb-SS disease with acute chest syndrome: Secondary | ICD-10-CM | POA: Diagnosis not present

## 2011-08-15 DIAGNOSIS — J189 Pneumonia, unspecified organism: Secondary | ICD-10-CM | POA: Diagnosis not present

## 2011-08-15 DIAGNOSIS — J9819 Other pulmonary collapse: Secondary | ICD-10-CM | POA: Diagnosis not present

## 2011-08-15 DIAGNOSIS — D72829 Elevated white blood cell count, unspecified: Secondary | ICD-10-CM | POA: Diagnosis not present

## 2011-08-15 DIAGNOSIS — M87029 Idiopathic aseptic necrosis of unspecified humerus: Secondary | ICD-10-CM | POA: Diagnosis not present

## 2011-08-15 LAB — CBC
HCT: 24.5 % — ABNORMAL LOW (ref 39.0–52.0)
MCH: 29.9 pg (ref 26.0–34.0)
MCV: 88.1 fL (ref 78.0–100.0)
Platelets: 490 10*3/uL — ABNORMAL HIGH (ref 150–400)
RBC: 2.78 MIL/uL — ABNORMAL LOW (ref 4.22–5.81)

## 2011-08-15 LAB — COMPREHENSIVE METABOLIC PANEL
BUN: 7 mg/dL (ref 6–23)
CO2: 22 mEq/L (ref 19–32)
Calcium: 8.9 mg/dL (ref 8.4–10.5)
Chloride: 105 mEq/L (ref 96–112)
Creatinine, Ser: 0.64 mg/dL (ref 0.50–1.35)
GFR calc Af Amer: >90 mL/min (ref >90)
GFR calc non Af Amer: >90 mL/min (ref >90)
Glucose, Bld: 105 mg/dL — ABNORMAL HIGH (ref 70–99)
Total Bilirubin: 3.3 mg/dL — ABNORMAL HIGH (ref 0.3–1.2)

## 2011-08-15 NOTE — Progress Notes (Signed)
UR done. 

## 2011-08-15 NOTE — Procedures (Signed)
R arm PICC No complication No blood loss. See complete dictation in [facility] PACS.  

## 2011-08-15 NOTE — Progress Notes (Signed)
Subjective:   [name]  Was seen on rounds today.  He was sitting up in bed watching TV in no acute distress. He complains of pain in his  back rib and leg pains grade 8/10. Denies any cough fever chills denies any urinary complaints or priapism   Objective:  Vital Signs in the last 24 hours: Temp:  [98.2 F (36.8 C)-98.9 F (37.2 C)] 98.9 F (37.2 C) (07/15 1002) Pulse Rate:  [71-88] 72  (07/15 1002) Resp:  [16-18] 18  (07/15 1002) BP: (115-133)/(70-84) 123/75 mmHg (07/15 1002) SpO2:  [96 %-100 %] 97 % (07/15 1002) Weight:  [86.4 kg (190 lb 7.6 oz)] 86.4 kg (190 lb 7.6 oz) (07/15 0501)  Intake/Output from previous day: 07/14 0701 - 07/15 0700 In: 480 [P.O.:480] Out: -  Intake/Output from this shift: Total I/O In: 240 [P.O.:240] Out: -   Physical Exam: General Appearance: Alert and oriented, cooperative, well developed, well nourished, mild distress  Head: Normocephalic, atraumatic  Eyes: PERRLA, EOMI, the patient has slight strabismus, scleral icterus  Nose: Nares normal, septum midline, mucosa normal, no drainage or sinus tenderness.  Throat: Normal lips, mucosa, tongue and gums  Back: Symmetric, lower back tenderness  Resp: CTA, no wheezes/rales/rhonchi  Cardio: S1, S2 regular, no murmurs/clicks/rubs/gallops  GI: Soft, non-tender, normoactive bowel sounds, no masses, no organomegaly  Male Genitalia: Deferred, the patient denies priapism  Extremities: Extremities normal, atraumatic, no cyanosis or edema, negative Homans sign, no sign of DVT, LE tenderness  Pulses: 2+ and symmetric  Skin: Skin color, texture, turgor normal, no rashes or lesions, numerous tattoos  Neurologic: Grossly normal, CN II-XII intact, no focal deficits  Psych: Appropriate affect  Lab Results:  Basename 08/13/11 1122  WBC 23.1*  HGB 10.6*  PLT 542*    Basename 08/13/11 1122  NA 135  K 3.4*  CL 102  CO2 22  GLUCOSE 109*  BUN 4*  CREATININE 0.45*   No results found for this basename:  TROPONINI:2,CK,MB:2 in the last 72 hours Hepatic Function Panel  Basename 08/13/11 1122  PROT 7.8  ALBUMIN 4.0  AST 30  ALT 34  ALKPHOS 118*  BILITOT 3.1*  BILIDIR --  IBILI --   No results found for this basename: CHOL in the last 72 hours No results found for this basename: PROTIME in the last 72 hours  Imaging: Imaging results have been reviewed and No results found.  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 pain management IVF, bowel and pruritis management   Marked leukocytosis Hypokalemia labs unable to be drawn AM reinitiated order review results when available  History of PE DVT prophylaxis   Avascular necrosis of shoulders consult orthopedist    LOS: 2 days    [name] P 08/15/2011, 12:58 PM

## 2011-08-16 DIAGNOSIS — D5701 Hb-SS disease with acute chest syndrome: Secondary | ICD-10-CM | POA: Diagnosis not present

## 2011-08-16 DIAGNOSIS — D5700 Hb-SS disease with crisis, unspecified: Secondary | ICD-10-CM | POA: Diagnosis not present

## 2011-08-16 DIAGNOSIS — D72829 Elevated white blood cell count, unspecified: Secondary | ICD-10-CM | POA: Diagnosis not present

## 2011-08-16 DIAGNOSIS — J189 Pneumonia, unspecified organism: Secondary | ICD-10-CM | POA: Diagnosis not present

## 2011-08-16 DIAGNOSIS — M87029 Idiopathic aseptic necrosis of unspecified humerus: Secondary | ICD-10-CM | POA: Diagnosis not present

## 2011-08-16 DIAGNOSIS — J9819 Other pulmonary collapse: Secondary | ICD-10-CM | POA: Diagnosis not present

## 2011-08-16 LAB — COMPREHENSIVE METABOLIC PANEL
ALT: 34 U/L (ref 0–53)
AST: 42 U/L — ABNORMAL HIGH (ref 0–37)
Alkaline Phosphatase: 127 U/L — ABNORMAL HIGH (ref 39–117)
CO2: 21 mEq/L (ref 19–32)
GFR calc Af Amer: >90 mL/min (ref >90)
GFR calc non Af Amer: >90 mL/min (ref >90)
Glucose, Bld: 115 mg/dL — ABNORMAL HIGH (ref 70–99)
Potassium: 4.2 mEq/L (ref 3.5–5.1)
Sodium: 137 mEq/L (ref 135–145)

## 2011-08-16 LAB — CBC
Hemoglobin: 8.2 g/dL — ABNORMAL LOW (ref 13.0–17.0)
Platelets: 474 10*3/uL — ABNORMAL HIGH (ref 150–400)
RBC: 2.78 MIL/uL — ABNORMAL LOW (ref 4.22–5.81)
WBC: 21.6 10*3/uL — ABNORMAL HIGH (ref 4.0–10.5)

## 2011-08-16 MED ORDER — CYCLOBENZAPRINE HCL 5 MG PO TABS
5.0000 mg | ORAL_TABLET | Freq: Three times a day (TID) | ORAL | Status: DC | PRN
  Administered 2011-08-16 – 2011-08-20 (×6): 5 mg via ORAL
  Filled 2011-08-16 (×6): qty 1

## 2011-08-16 NOTE — Progress Notes (Signed)
Subjective:   [name]  Was seen on rounds today.  He was sitting up in bed watching TV in no acute distress. He complains of pain in his ribs and thighs/leg pains grade 6/10. Denies any cough fever chills denies any urinary complaints or priapism   Objective:  Vital Signs in the last 24 hours: Temp:  [98 F (36.7 C)-98.9 F (37.2 C)] 98.8 F (37.1 C) (07/16 1047) Pulse Rate:  [70-91] 74  (07/16 1047) Resp:  [16-18] 18  (07/16 1047) BP: (118-135)/(74-85) 118/76 mmHg (07/16 1047) SpO2:  [96 %-99 %] 99 % (07/16 1047) Weight:  [86.9 kg (191 lb 9.3 oz)] 86.9 kg (191 lb 9.3 oz) (07/16 0700)  Intake/Output from previous day: 07/15 0701 - 07/16 0700 In: 2128 [P.O.:480; I.V.:1648] Out: -  Intake/Output from this shift:    Physical Exam: General Appearance: Alert and oriented, cooperative, well developed, well nourished, mild distress  Head: Normocephalic, atraumatic  Eyes: PERRLA, EOMI, the patient has slight strabismus, scleral icterus  Nose: Nares normal, septum midline, mucosa normal, no drainage or sinus tenderness.  Throat: Normal lips, mucosa, tongue and gums  Back: Symmetric, lower back tenderness  Resp: CTA, no wheezes/rales/rhonchi  Cardio: S1, S2 regular, no murmurs/clicks/rubs/gallops  GI: Soft, non-tender, normoactive bowel sounds, no masses, no organomegaly  Male Genitalia: Deferred, the patient denies priapism  Extremities: Extremities normal, atraumatic, no cyanosis or edema, negative Homans sign, no sign of DVT, LE tenderness  Pulses: 2+ and symmetric  Skin: Skin color, texture, turgor normal, no rashes or lesions, numerous tattoos  Neurologic: Grossly normal, CN II-XII intact, no focal deficits  Psych: Appropriate affect  Lab Results:  Basename 08/16/11 0430 08/15/11 1330  WBC 21.6* 18.2*  HGB 8.2* 8.3*  PLT 474* 490*    Basename 08/16/11 0430 08/15/11 1330  NA 137 136  K 4.2 4.4  CL 105 105  CO2 21 22  GLUCOSE 115* 105*  BUN 9 7  CREATININE 0.72 0.64    No results found for this basename: TROPONINI:2,CK,MB:2 in the last 72 hours Hepatic Function Panel  Basename 08/16/11 0430  PROT 7.4  ALBUMIN 4.0  AST 42*  ALT 34  ALKPHOS 127*  BILITOT 3.0*  BILIDIR --  IBILI --   No results found for this basename: CHOL in the last 72 hours No results found for this basename: PROTIME in the last 72 hours  Imaging: Imaging results have been reviewed and Ir Fluoro Guide Cv Line Right  08/15/2011  * RADIOLOGY REPORT *  Clinical data: Sickle cell crisis, needs venous access  PICC PLACEMENT WITH ULTRASOUND AND FLUOROSCOPY  Technique: After written informed consent was obtained, patient was placed in the supine position on angiographic table. Patency of the right brachial vein was confirmed with ultrasound with image documentation. An appropriate skin site was determined. Skin site was marked. Region was prepped using maximum barrier technique including cap and mask, sterile gown, sterile gloves, large sterile sheet, and Chlorhexidine   as cutaneous antisepsis.  The region was infiltrated locally with 1% lidocaine.   Under real-time ultrasound guidance, the right brachial vein was accessed with a 21 gauge micropuncture needle; the needle tip within the vein was confirmed with ultrasound image documentation.   Needle exchanged over a 018 guidewire for a peel-away sheath, through which a 5-French single- lumen power injectable PICC trimmed to 43cm was advanced, positioned with its tip near the cavoatrial junction.  Spot chest radiograph confirms appropriate catheter position.  Catheter was flushed per protocol and  secured externally with 0-Prolene sutures. The patient tolerated procedure well, with no immediate complication.  IMPRESSION: Technically successful five [place] single lumen power injectable PICC placement  Original Report Authenticated By: Osa Craver, M.D.   Ir US Guide Vasc Access Right  08/15/2011  * RADIOLOGY REPORT *  Clinical data:  Sickle cell crisis, needs venous access  PICC PLACEMENT WITH ULTRASOUND AND FLUOROSCOPY  Technique: After written informed consent was obtained, patient was placed in the supine position on angiographic table. Patency of the right brachial vein was confirmed with ultrasound with image documentation. An appropriate skin site was determined. Skin site was marked. Region was prepped using maximum barrier technique including cap and mask, sterile gown, sterile gloves, large sterile sheet, and Chlorhexidine   as cutaneous antisepsis.  The region was infiltrated locally with 1% lidocaine.   Under real-time ultrasound guidance, the right brachial vein was accessed with a 21 gauge micropuncture needle; the needle tip within the vein was confirmed with ultrasound image documentation.   Needle exchanged over a 018 guidewire for a peel-away sheath, through which a 5-French single- lumen power injectable PICC trimmed to 43cm was advanced, positioned with its tip near the cavoatrial junction.  Spot chest radiograph confirms appropriate catheter position.  Catheter was flushed per protocol and secured externally with 0-Prolene sutures. The patient tolerated procedure well, with no immediate complication.  IMPRESSION: Technically successful five [place] single lumen power injectable PICC placement  Original Report Authenticated By: Osa Craver, M.D.    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 pain management IVF, bowel and pruritis management   Marked leukocytosis Hypokalemia resolved History of PE DVT prophylaxis  Avascular necrosis of shoulders consult orthopedist will d/w Dr. [name]    LOS: 3 days    [name], [name] 08/16/2011, 12:53 PM

## 2011-08-16 NOTE — Discharge Summary (Signed)
Physician Discharge Summary  Patient ID: [name] MRN: [id number] DOB/AGE: 05/13/1980 31 y.o.  Admit date: 07/18/2011 Discharge date: 07/23/2011   Discharge Diagnoses:  Active Problems:  Increased ammonia level Acute sickle cell crisis, improved Acute chest syndrome. Leukocytosis improving. Hypokalemia, resolved. Hemachromatosis.   Discharged Condition: Improved  Operations/Procedues: Exchange transfusions x3   Hospital Course: Patient was started on IV fluids with half-normal saline with 20 of KCl per liter. He also required supplemental oral potassium for document hypokalemia. Patient was placed on empiric antibiotics with IV Levaquin. He is also given a nebulizer therapy and incentive spirometry. Over the subsequent days he continued to have significant pain. His picture was consistent with active hemolysis and early acute chest syndrome. He was thereafter given exchange transfusions x3 times. Patient eventually had improvement of his pain to the point that he can manage this at home.  Patient also was seen by physical therapy for muscle skeletal back pain which helped as well. The blood work was also notable for him having elevated ammonia levels consistent with early hepatic encephalopathy. This was treated with lactulose as needed with eventual stabilization of his ammonia to level of 54. Potassium was normal at 3.8 at the time of discharge.  Disposition: Followup in office in 1 week's time.  Discharge Orders    Future Orders Please Complete By Expires   Diet - low sodium heart healthy      Increase activity slowly      No wound care      Call MD for:  severe uncontrolled pain      Discharge instructions      Comments:   Follow up with primary care physician in two weeks.     Medication List  As of 08/16/2011  3:12 PM   STOP taking these medications         HYDROmorphone 4 MG tablet      oxyCODONE 80 MG 12 hr tablet         TAKE these medications        hydroxyurea 500 MG capsule   Commonly known as: HYDREA   Take 500 mg by mouth 2 (two) times daily. May take with food to minimize GI side effects.      OLANZapine 10 MG tablet   Commonly known as: ZYPREXA   Take 1 tablet (10 mg total) by mouth at bedtime.      potassium chloride SA 20 MEQ tablet   Commonly known as: K-DUR,KLOR-CON   Take 1 tablet (20 mEq total) by mouth daily.      zolpidem 10 MG tablet   Commonly known as: AMBIEN   Take 1 tablet (10 mg total) by mouth at bedtime as needed. For sleep.             SignedAugust Saucer, Acie Custis 07/23/2011, 6:00 p.m.

## 2011-08-17 ENCOUNTER — Inpatient Hospital Stay (HOSPITAL_COMMUNITY): Payer: Medicare Other

## 2011-08-17 DIAGNOSIS — J189 Pneumonia, unspecified organism: Secondary | ICD-10-CM | POA: Diagnosis not present

## 2011-08-17 DIAGNOSIS — J9819 Other pulmonary collapse: Secondary | ICD-10-CM | POA: Diagnosis not present

## 2011-08-17 DIAGNOSIS — D5701 Hb-SS disease with acute chest syndrome: Secondary | ICD-10-CM | POA: Diagnosis not present

## 2011-08-17 DIAGNOSIS — D5700 Hb-SS disease with crisis, unspecified: Secondary | ICD-10-CM | POA: Diagnosis not present

## 2011-08-17 DIAGNOSIS — M87029 Idiopathic aseptic necrosis of unspecified humerus: Secondary | ICD-10-CM | POA: Diagnosis not present

## 2011-08-17 DIAGNOSIS — M25519 Pain in unspecified shoulder: Secondary | ICD-10-CM | POA: Diagnosis not present

## 2011-08-17 DIAGNOSIS — D72829 Elevated white blood cell count, unspecified: Secondary | ICD-10-CM | POA: Diagnosis not present

## 2011-08-17 LAB — COMPREHENSIVE METABOLIC PANEL
Albumin: 4 g/dL (ref 3.5–5.2)
Alkaline Phosphatase: 116 U/L (ref 39–117)
BUN: 7 mg/dL (ref 6–23)
Calcium: 9 mg/dL (ref 8.4–10.5)
GFR calc Af Amer: >90 mL/min (ref >90)
Glucose, Bld: 123 mg/dL — ABNORMAL HIGH (ref 70–99)
Potassium: 3.9 mEq/L (ref 3.5–5.1)
Sodium: 137 mEq/L (ref 135–145)
Total Protein: 7.6 g/dL (ref 6.0–8.3)

## 2011-08-17 LAB — CBC
HCT: 24.2 % — ABNORMAL LOW (ref 39.0–52.0)
Hemoglobin: 8.1 g/dL — ABNORMAL LOW (ref 13.0–17.0)
MCH: 29.5 pg (ref 26.0–34.0)
MCHC: 33.5 g/dL (ref 30.0–36.0)
RDW: 18.2 % — ABNORMAL HIGH (ref 11.5–15.5)

## 2011-08-17 NOTE — Progress Notes (Signed)
Subjective:   [name]  Was seen on rounds today.  He was sitting up in bed watching TV in no acute distress. He complains of pain in his left shoulder , ribs and thighs/leg pains grade 5/10. He drifts off to sleep during this visit. We discussed needing new x-rays and Dr. Dion Saucier will be in tomorrow to evaluate him and possible injection patient in agreement .Denies any cough fever chills denies any urinary complaints or priapism   Objective:  Vital Signs in the last 24 hours: Temp:  [98.2 F (36.8 C)-98.8 F (37.1 C)] 98.4 F (36.9 C) (07/17 0612) Pulse Rate:  [74-84] 84  (07/17 0612) Resp:  [16-18] 18  (07/17 0612) BP: (117-142)/(73-86) 121/80 mmHg (07/17 0612) SpO2:  [78 %-99 %] 95 % (07/17 0612)  Intake/Output from previous day: 07/16 0701 - 07/17 0700 In: 2300 [I.V.:2300] Out: -  Intake/Output from this shift:    Physical Exam: General Appearance: Alert and oriented, cooperative, well developed, well nourished, mild distress  Head: Normocephalic, atraumatic  Eyes: PERRLA, EOMI, the patient has slight strabismus, scleral icterus  Nose: Nares normal, septum midline, mucosa normal, no drainage or sinus tenderness.  Throat: Normal lips, mucosa, tongue and gums  Back: Symmetric, lower back tenderness  Resp: CTA, no wheezes/rales/rhonchi  Cardio: S1, S2 regular, no murmurs/clicks/rubs/gallops  GI: Soft, non-tender, normoactive bowel sounds, no masses, no organomegaly  Male Genitalia: Deferred, the patient denies priapism  Extremities: Extremities normal, atraumatic, no cyanosis or edema, negative Homans sign, no sign of DVT, LE tenderness decrease ROM left shoulder  Pulses: 2+ and symmetric  Skin: Skin color, texture, turgor normal, no rashes or lesions, numerous tattoos  Neurologic: Grossly normal, CN II-XII intact, no focal deficits  Psych: Appropriate affect  Lab Results:  Basename 08/17/11 0500 08/16/11 0430  WBC 17.3* 21.6*  HGB 8.1* 8.2*  PLT 474* 474*     Basename 08/17/11 0500 08/16/11 0430  NA 137 137  K 3.9 4.2  CL 105 105  CO2 22 21  GLUCOSE 123* 115*  BUN 7 9  CREATININE 0.65 0.72   No results found for this basename: TROPONINI:2,CK,MB:2 in the last 72 hours Hepatic Function Panel  Basename 08/17/11 0500  PROT 7.6  ALBUMIN 4.0  AST 57*  ALT 45  ALKPHOS 116  BILITOT 3.0*  BILIDIR --  IBILI --   No results found for this basename: CHOL in the last 72 hours No results found for this basename: PROTIME in the last 72 hours  Imaging: Imaging results have been reviewed and Ir Fluoro Guide Cv Line Right  08/15/2011  * RADIOLOGY REPORT *  Clinical data: Sickle cell crisis, needs venous access  PICC PLACEMENT WITH ULTRASOUND AND FLUOROSCOPY  Technique: After written informed consent was obtained, patient was placed in the supine position on angiographic table. Patency of the right brachial vein was confirmed with ultrasound with image documentation. An appropriate skin site was determined. Skin site was marked. Region was prepped using maximum barrier technique including cap and mask, sterile gown, sterile gloves, large sterile sheet, and Chlorhexidine   as cutaneous antisepsis.  The region was infiltrated locally with 1% lidocaine.   Under real-time ultrasound guidance, the right brachial vein was accessed with a 21 gauge micropuncture needle; the needle tip within the vein was confirmed with ultrasound image documentation.   Needle exchanged over a 018 guidewire for a peel-away sheath, through which a 5-French single- lumen power injectable PICC trimmed to 43cm was advanced, positioned with its tip  near the cavoatrial junction.  Spot chest radiograph confirms appropriate catheter position.  Catheter was flushed per protocol and secured externally with 0-Prolene sutures. The patient tolerated procedure well, with no immediate complication.  IMPRESSION: Technically successful five [place] single lumen power injectable PICC placement   Original Report Authenticated By: Osa Craver, M.D.   Ir US Guide Vasc Access Right  08/15/2011  * RADIOLOGY REPORT *  Clinical data: Sickle cell crisis, needs venous access  PICC PLACEMENT WITH ULTRASOUND AND FLUOROSCOPY  Technique: After written informed consent was obtained, patient was placed in the supine position on angiographic table. Patency of the right brachial vein was confirmed with ultrasound with image documentation. An appropriate skin site was determined. Skin site was marked. Region was prepped using maximum barrier technique including cap and mask, sterile gown, sterile gloves, large sterile sheet, and Chlorhexidine   as cutaneous antisepsis.  The region was infiltrated locally with 1% lidocaine.   Under real-time ultrasound guidance, the right brachial vein was accessed with a 21 gauge micropuncture needle; the needle tip within the vein was confirmed with ultrasound image documentation.   Needle exchanged over a 018 guidewire for a peel-away sheath, through which a 5-French single- lumen power injectable PICC trimmed to 43cm was advanced, positioned with its tip near the cavoatrial junction.  Spot chest radiograph confirms appropriate catheter position.  Catheter was flushed per protocol and secured externally with 0-Prolene sutures. The patient tolerated procedure well, with no immediate complication.  IMPRESSION: Technically successful five [place] single lumen power injectable PICC placement  Original Report Authenticated By: Osa Craver, M.D.    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    LOS: 4 days     [name] 08/17/2011, 9:22 AM

## 2011-08-18 DIAGNOSIS — M87029 Idiopathic aseptic necrosis of unspecified humerus: Secondary | ICD-10-CM | POA: Diagnosis not present

## 2011-08-18 DIAGNOSIS — J189 Pneumonia, unspecified organism: Secondary | ICD-10-CM | POA: Diagnosis not present

## 2011-08-18 DIAGNOSIS — D72829 Elevated white blood cell count, unspecified: Secondary | ICD-10-CM | POA: Diagnosis not present

## 2011-08-18 DIAGNOSIS — D5701 Hb-SS disease with acute chest syndrome: Secondary | ICD-10-CM | POA: Diagnosis not present

## 2011-08-18 DIAGNOSIS — D5700 Hb-SS disease with crisis, unspecified: Secondary | ICD-10-CM | POA: Diagnosis not present

## 2011-08-18 DIAGNOSIS — J9819 Other pulmonary collapse: Secondary | ICD-10-CM | POA: Diagnosis not present

## 2011-08-18 LAB — CBC
HCT: 22.7 % — ABNORMAL LOW (ref 39.0–52.0)
Hemoglobin: 7.8 g/dL — ABNORMAL LOW (ref 13.0–17.0)
MCHC: 34.4 g/dL (ref 30.0–36.0)
RBC: 2.63 MIL/uL — ABNORMAL LOW (ref 4.22–5.81)

## 2011-08-18 LAB — COMPREHENSIVE METABOLIC PANEL
ALT: 67 U/L — ABNORMAL HIGH (ref 0–53)
Alkaline Phosphatase: 131 U/L — ABNORMAL HIGH (ref 39–117)
BUN: 7 mg/dL (ref 6–23)
CO2: 23 mEq/L (ref 19–32)
Chloride: 104 mEq/L (ref 96–112)
GFR calc Af Amer: >90 mL/min (ref >90)
GFR calc non Af Amer: >90 mL/min (ref >90)
Glucose, Bld: 135 mg/dL — ABNORMAL HIGH (ref 70–99)
Potassium: 4 mEq/L (ref 3.5–5.1)
Sodium: 135 mEq/L (ref 135–145)
Total Bilirubin: 3.5 mg/dL — ABNORMAL HIGH (ref 0.3–1.2)

## 2011-08-18 MED ORDER — ENOXAPARIN SODIUM 40 MG/0.4ML ~~LOC~~ SOLN
40.0000 mg | SUBCUTANEOUS | Status: DC
  Administered 2011-08-18 – 2011-08-21 (×4): 40 mg via SUBCUTANEOUS
  Filled 2011-08-18 (×4): qty 0.4

## 2011-08-18 NOTE — Consult Note (Signed)
ORTHOPAEDIC CONSULTATION  REQUESTING PHYSICIAN: Gwenyth Bender, MD  Chief Complaint: Left shoulder pain  HPI: [name] is a 31 y.o. male who complains of  chronic left shoulder pain. He has had injections before that benefited him to some degree. The pain as moderate to severe. Located directly around the left shoulder. Worse with activity. Better with rest. He has known avascular necrosis, and so far we are managing him with conservative treatment.  Past Medical History  Diagnosis Date  . Sickle cell anemia   . Blood transfusion   . 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 Hypokalemia   . Mood disorder   . Pulmonary embolism   . Avascular necrosis   . Leukocytosis     Chronic  . Thrombocytosis     Chronic   Past Surgical History  Procedure Date  . Right hip replacement     08/2006  . Cholecystectomy     01/2008  . Porta cath placement   . Porta cath removal   . Umbilical hernia repair     01/2008  . Excision of left periauricular cyst     10/2009  . Excision of right ear lobe cyst with primary closur     11/2007   History   Social History  . Marital Status: Single    Spouse Name: N/A    Number of Children: 0  . Years of Education: 13   Occupational History  . Unemployed    Social History Main Topics  . Smoking status: Former Smoker -- 13 years    Types: Cigarettes    Quit date: 07/08/2010  . Smokeless tobacco: Never Used  . Alcohol Use: No  . Drug Use: No     quit smoking 2011  . Sexually Active: Yes    Birth Control/ Protection: None   Other Topics Concern  . None   Social History Narrative   Single.  Lives with girlfriend.     Family History  Problem Relation Age of Onset  . Sickle cell anemia Mother   . Sickle cell anemia Father   . Sickle cell trait Brother    Allergies  Allergen Reactions  . Morphine And Related Hives and Rash    Pt states he is able to tolerate Dilaudid with no reactions.   Prior to  Admission medications   Medication Sig Start Date End Date Taking? Authorizing Provider  folic acid (FOLVITE) 1 MG tablet Take 1 mg by mouth daily.   Yes Historical Provider, MD  HYDROmorphone (DILAUDID) 4 MG tablet Take 4 mg by mouth every 4 (four) hours as needed. For pain.   Yes Historical Provider, MD  hydroxyurea (HYDREA) 500 MG capsule Take 500 mg by mouth 2 (two) times daily. May take with food to minimize GI side effects.   Yes Keitha Butte, NP  ibuprofen (ADVIL,MOTRIN) 800 MG tablet Take 800 mg by mouth every 8 (eight) hours as needed. For pain.   Yes Historical Provider, MD  oxyCODONE (OXYCONTIN) 80 MG 12 hr tablet Take 80 mg by mouth every 12 (twelve) hours.   Yes Historical Provider, MD  potassium chloride SA (K-DUR,KLOR-CON) 20 MEQ tablet Take 1 tablet (20 mEq total) by mouth daily. 03/11/11  Yes Keitha Butte, NP  zolpidem (AMBIEN) 10 MG tablet Take 1 tablet (10 mg total) by mouth at bedtime as needed. For sleep. 07/23/11  Yes Gwenyth Bender, MD  OLANZapine (ZYPREXA) 10 MG tablet Take 1  tablet (10 mg total) by mouth at bedtime. 07/11/11 08/10/11  Keitha Butte, NP   Dg Chest 2 View  08/17/2011  *RADIOLOGY REPORT*  Clinical Data: Left shoulder pain.  CHEST - 2 VIEW  Comparison: 07/27/2011  Findings: Right-sided PICC line tip projects over the SVC.  Mild cardiomegaly noted without edema.  Scarring or subsegmental atelectasis noted at the right lung base.  IMPRESSION:  1.  Scarring or subsegmental atelectasis at the right lung base. 2.  Right-sided PICC line tip:  SVC. 3.  Stable cardiomegaly.  Original Report Authenticated By: Dellia Cloud, M.D.   Dg Shoulder Left  08/17/2011  *RADIOLOGY REPORT*  Clinical Data: Left shoulder pain.  LEFT SHOULDER - 2+ VIEW  Comparison: 06/09/2010  Findings: Stable sclerosis in the left humeral head is compatible with avascular necrosis.  No collapse/fracture is observed.  The overall appearance is stable.  The scapula and clavicle appear  unremarkable.  IMPRESSION:  1.  Stable sclerosis in the left humeral head compatible with avascular necrosis, without collapse or complicating feature.  Original Report Authenticated By: Dellia Cloud, M.D.    Positive ROS: All other systems have been reviewed and were otherwise negative with the exception of those mentioned in the HPI and as above.  Physical Exam: General: Alert, no acute distress Cardiovascular: No pedal edema Respiratory: No cyanosis, no use of accessory musculature GI: No organomegaly, abdomen is soft and non-tender Skin: No lesions in the area of chief complaint Neurologic: Sensation intact distally Psychiatric: Patient is competent for consent with normal mood and affect Lymphatic: No axillary or cervical lymphadenopathy  MUSCULOSKELETAL: Left shoulder active forward flexion is 0-95, but he does this with some pain. External rotation to 20. Rotator cuff strength is intact, but limited by pain.  Assessment: Left shoulder avascular necrosis, sickle cell disease.  Plan: This is a chronic condition as moderate to severe in intensity. I discussed the options with him, and he is currently admitted for sickle cell crisis, but we will plan to inject her shoulder today, and hopefully this will provide him with some symptom improvement. He may elects for a shoulder hemiarthroplasty at some point down the line, and I am happy to see him in the office in the next 2-3 weeks to continue the surgical discussion if he so desires.  Thank you for this consultation.  08/18/2011  9:42 AM  PATIENT:  [name]    PRE-PROCEDURE DIAGNOSIS:  Left shoulder avascular necrosis  POST-OPERATIVE DIAGNOSIS:  Same  PROCEDURE:  Intra-articular injection left shoulder  PROCEDURE DETAILS:  After informed verbal consent was obtained the posterior portal was prepped with chlorhexidine and topical culture use of an anesthetic and 4 cc of Xylocaine and 1 cc of Depo-Medrol was  injected into the glenohumeral joint.  He tolerated this well and a Band-Aid was placed.    Mordche Hedglin P, MD Cell [phone] Pager [phone]  08/18/2011 9:39 AM

## 2011-08-18 NOTE — Progress Notes (Signed)
UR done. 

## 2011-08-18 NOTE — Progress Notes (Signed)
Subjective:   [name]  Was seen on rounds today.  He was sitting up in bed watching TV in no acute distress. He complains of pain in his left shoulder ,ribs and thighs/leg pains grade 6/10. Dr. Dion Saucier saw patient today tolerated injection procedure well. He demonstrated how he could raise his arm better/higher now. Denies any cough fever chills denies any urinary complaints or priapism   Objective:  Vital Signs in the last 24 hours: Temp:  [97.6 F (36.4 C)-99.2 F (37.3 C)] 98.9 F (37.2 C) (07/18 0539) Pulse Rate:  [80-98] 98  (07/18 0539) Resp:  [16-20] 18  (07/18 0539) BP: (118-138)/(72-88) 125/81 mmHg (07/18 0539) SpO2:  [95 %-99 %] 95 % (07/18 0539)  Intake/Output from previous day: 07/17 0701 - 07/18 0700 In: 2480 [I.V.:2480] Out: -  Intake/Output from this shift:    Physical Exam: General Appearance: Alert and oriented, cooperative, well developed, well nourished, mild distress  Head: Normocephalic, atraumatic  Eyes: PERRLA, EOMI, the patient has slight strabismus, scleral icterus  Nose: Nares normal, septum midline, mucosa normal, no drainage or sinus tenderness.  Throat: Normal lips, mucosa, tongue and gums  Back: Symmetric, lower back tenderness  Resp: CTA, no wheezes/rales/rhonchi  Cardio: S1, S2 regular, no murmurs/clicks/rubs/gallops  GI: Soft, non-tender, normoactive bowel sounds, no masses, no organomegaly  Male Genitalia: Deferred, the patient denies priapism  Extremities: Extremities normal, atraumatic, no cyanosis or edema, negative Homans sign, no sign of DVT, LE tenderness decrease ROM left shoulder  Pulses: 2+ and symmetric  Skin: Skin color, texture, turgor normal, no rashes or lesions, numerous tattoos  Neurologic: Grossly normal, CN II-XII intact, no focal deficits  Psych: Appropriate affect  Lab Results:  Basename 08/18/11 0442 08/17/11 0500  WBC 16.7* 17.3*  HGB 7.8* 8.1*  PLT 440* 474*    Basename 08/18/11 0442 08/17/11 0500  NA 135 137    K 4.0 3.9  CL 104 105  CO2 23 22  GLUCOSE 135* 123*  BUN 7 7  CREATININE 0.60 0.65   No results found for this basename: TROPONINI:2,CK,MB:2 in the last 72 hours Hepatic Function Panel  Basename 08/18/11 0442  PROT 7.4  ALBUMIN 4.0  AST 72*  ALT 67*  ALKPHOS 131*  BILITOT 3.5*  BILIDIR --  IBILI --   No results found for this basename: CHOL in the last 72 hours No results found for this basename: PROTIME in the last 72 hours  Imaging: Imaging results have been reviewed and Dg Chest 2 View  08/17/2011  *RADIOLOGY REPORT*  Clinical Data: Left shoulder pain.  CHEST - 2 VIEW  Comparison: 07/27/2011  Findings: Right-sided PICC line tip projects over the SVC.  Mild cardiomegaly noted without edema.  Scarring or subsegmental atelectasis noted at the right lung base.  IMPRESSION:  1.  Scarring or subsegmental atelectasis at the right lung base. 2.  Right-sided PICC line tip:  SVC. 3.  Stable cardiomegaly.  Original Report Authenticated By: Dellia Cloud, M.D.   Dg Shoulder Left  08/17/2011  *RADIOLOGY REPORT*  Clinical Data: Left shoulder pain.  LEFT SHOULDER - 2+ VIEW  Comparison: 06/09/2010  Findings: Stable sclerosis in the left humeral head is compatible with avascular necrosis.  No collapse/fracture is observed.  The overall appearance is stable.  The scapula and clavicle appear unremarkable.  IMPRESSION:  1.  Stable sclerosis in the left humeral head compatible with avascular necrosis, without collapse or complicating feature.  Original Report Authenticated By: Dellia Cloud, M.D.  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

## 2011-08-19 DIAGNOSIS — J9819 Other pulmonary collapse: Secondary | ICD-10-CM | POA: Diagnosis not present

## 2011-08-19 DIAGNOSIS — D5700 Hb-SS disease with crisis, unspecified: Secondary | ICD-10-CM | POA: Diagnosis not present

## 2011-08-19 DIAGNOSIS — D72829 Elevated white blood cell count, unspecified: Secondary | ICD-10-CM | POA: Diagnosis not present

## 2011-08-19 DIAGNOSIS — M87029 Idiopathic aseptic necrosis of unspecified humerus: Secondary | ICD-10-CM | POA: Diagnosis not present

## 2011-08-19 DIAGNOSIS — D5701 Hb-SS disease with acute chest syndrome: Secondary | ICD-10-CM | POA: Diagnosis not present

## 2011-08-19 DIAGNOSIS — J189 Pneumonia, unspecified organism: Secondary | ICD-10-CM | POA: Diagnosis not present

## 2011-08-19 LAB — COMPREHENSIVE METABOLIC PANEL
AST: 82 U/L — ABNORMAL HIGH (ref 0–37)
Albumin: 3.9 g/dL (ref 3.5–5.2)
Alkaline Phosphatase: 129 U/L — ABNORMAL HIGH (ref 39–117)
BUN: 12 mg/dL (ref 6–23)
Chloride: 100 mEq/L (ref 96–112)
Potassium: 4.4 mEq/L (ref 3.5–5.1)
Sodium: 135 mEq/L (ref 135–145)
Total Protein: 7.6 g/dL (ref 6.0–8.3)

## 2011-08-19 LAB — CBC
HCT: 22.1 % — ABNORMAL LOW (ref 39.0–52.0)
MCHC: 34.4 g/dL (ref 30.0–36.0)
Platelets: 443 10*3/uL — ABNORMAL HIGH (ref 150–400)
RDW: 18.2 % — ABNORMAL HIGH (ref 11.5–15.5)
WBC: 19.2 10*3/uL — ABNORMAL HIGH (ref 4.0–10.5)

## 2011-08-19 MED ORDER — KETOROLAC TROMETHAMINE 30 MG/ML IJ SOLN
30.0000 mg | Freq: Four times a day (QID) | INTRAMUSCULAR | Status: DC | PRN
  Administered 2011-08-19 – 2011-08-20 (×3): 30 mg via INTRAVENOUS
  Filled 2011-08-19 (×3): qty 1

## 2011-08-19 NOTE — Progress Notes (Signed)
Subjective:   [name]  Was seen on rounds today.  He was sitting up in bed watching TV in mild distress.  Distress is contributed to his shoulder injection done yesterday more tender sore today but tomorrow hopefully it will be/feel better. He still complains of pain in his left shoulder but understands why , and his ribs and thighs/leg pains grade hurt less but overall pain is  8/10. Dr. Dion Saucier saw patient today tolerated injection procedure well. Denies any cough fever chills denies any urinary complaints or priapism   Objective:  Vital Signs in the last 24 hours: Temp:  [98 F (36.7 C)-98.6 F (37 C)] 98.4 F (36.9 C) (07/19 1058) Pulse Rate:  [75-85] 83  (07/19 1058) Resp:  [18-20] 18  (07/19 1058) BP: (115-134)/(72-83) 122/75 mmHg (07/19 1058) SpO2:  [96 %-99 %] 96 % (07/19 1058)  Intake/Output from previous day: 07/18 0701 - 07/19 0700 In: 3011 [P.O.:480; I.V.:2531] Out: -  Intake/Output from this shift: Total I/O In: 240 [P.O.:240] Out: -   Physical Exam: General Appearance: Alert and oriented, cooperative, well developed, well nourished, mild distress  Head: Normocephalic, atraumatic  Eyes: PERRLA, EOMI, the patient has slight strabismus, scleral icterus  Nose: Nares normal, septum midline, mucosa normal, no drainage or sinus tenderness.  Throat: Normal lips, mucosa, tongue and gums  Back: Symmetric, lower back tenderness  Resp: CTA, no wheezes/rales/rhonchi  Cardio: S1, S2 regular, no murmurs/clicks/rubs/gallops  GI: Soft, non-tender, normoactive bowel sounds, no masses, no organomegaly  Male Genitalia: Deferred, the patient denies priapism  Extremities: Extremities normal, atraumatic, no cyanosis or edema, negative Homans sign, no sign of DVT, LE tenderness decrease ROM left shoulder increase pain  Pulses: 2+ and symmetric  Skin: Skin color, texture, turgor normal, no rashes or lesions, numerous tattoos  Neurologic: Grossly normal, CN II-XII intact, no focal  deficits  Psych: Appropriate affect  Lab Results:  Basename 08/19/11 0351 08/18/11 0442  WBC 19.2* 16.7*  HGB 7.6* 7.8*  PLT 443* 440*    Basename 08/19/11 0351 08/18/11 0442  NA 135 135  K 4.4 4.0  CL 100 104  CO2 25 23  GLUCOSE 109* 135*  BUN 12 7  CREATININE 0.68 0.60   No results found for this basename: TROPONINI:2,CK,MB:2 in the last 72 hours Hepatic Function Panel  Basename 08/19/11 0351  PROT 7.6  ALBUMIN 3.9  AST 82*  ALT 74*  ALKPHOS 129*  BILITOT 3.8*  BILIDIR --  IBILI --   No results found for this basename: CHOL in the last 72 hours No results found for this basename: PROTIME in the last 72 hours  Imaging: Imaging results have been reviewed and Dg Chest 2 View  08/17/2011  *RADIOLOGY REPORT*  Clinical Data: Left shoulder pain.  CHEST - 2 VIEW  Comparison: 07/27/2011  Findings: Right-sided PICC line tip projects over the SVC.  Mild cardiomegaly noted without edema.  Scarring or subsegmental atelectasis noted at the right lung base.  IMPRESSION:  1.  Scarring or subsegmental atelectasis at the right lung base. 2.  Right-sided PICC line tip:  SVC. 3.  Stable cardiomegaly.  Original Report Authenticated By: Dellia Cloud, M.D.   Dg Shoulder Left  08/17/2011  *RADIOLOGY REPORT*  Clinical Data: Left shoulder pain.  LEFT SHOULDER - 2+ VIEW  Comparison: 06/09/2010  Findings: Stable sclerosis in the left humeral head is compatible with avascular necrosis.  No collapse/fracture is observed.  The overall appearance is stable.  The scapula and clavicle appear unremarkable.  IMPRESSION:  1.  Stable sclerosis in the left humeral head compatible with avascular necrosis, without collapse or complicating feature.  Original Report Authenticated By: Dellia Cloud, M.D.    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

## 2011-08-19 NOTE — Evaluation (Signed)
Physical Therapy Evaluation Patient Details Name: [name] MRN: [id number] DOB: 1981-01-25 Today's Date: 08/19/2011 Time: 8657-8469 PT Time Calculation (min): 18 min  PT Assessment / Plan / Recommendation Clinical Impression  31 y.o. male admitted with sickle cell crisis, and pain in L ribs and shoulder. Pt reports L shoulder pain started approx. 2 yrs. ago and is painful with elevation above 90*. Instructed pt in pendulum exercises and shoulder rolls. Encouraged pt to f/u with ortho MD on outpt basis.     PT Assessment  Patient needs continued PT services    Follow Up Recommendations  Outpatient PT    Barriers to Discharge None      Equipment Recommendations  None recommended by PT    Recommendations for Other Services     Frequency Min 3X/week    Precautions / Restrictions Precautions Precaution Comments: h/o R THA and revision Restrictions Weight Bearing Restrictions: No   Pertinent Vitals/Pain *6/10 L shoulder Pt premedicated Pt uses hot pack, but states it provides little relief**      Mobility  Bed Mobility Supine to Sit: With rails;HOB elevated;6: Modified independent (Device/Increase time) Transfers Transfers: Sit to Stand;Stand to Sit Sit to Stand: 6: Modified independent (Device/Increase time);From bed;With upper extremity assist Stand to Sit: 6: Modified independent (Device/Increase time);To bed;With upper extremity assist Ambulation/Gait Ambulation/Gait Assistance: 6: Modified independent (Device/Increase time) Ambulation Distance (Feet): 200 Feet Assistive device: Other (Comment) (pushing IV pole) Gait Pattern: Within Functional Limits    Exercises Other Exercises Other Exercises: Pendulum exercises for L shoulder (circles, flexion/ext, horizontal flex/extension) Other Exercises: Shoulder rolls   PT Diagnosis: Acute pain  PT Problem List: Decreased range of motion;Pain PT Treatment Interventions: Patient/family education;Therapeutic exercise    PT Goals Acute Rehab PT Goals PT Goal Formulation: With patient Time For Goal Achievement: 08/26/11 Potential to Achieve Goals: Good Pt will Perform Home Exercise Program: Independently  Visit Information  Last PT Received On: 08/19/11 Assistance Needed: +1    Subjective Data  Subjective: My left shoulder is killing me.  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 Living Lives With: Significant other Available Help at Discharge: Friend(s) Type of Home: Apartment Home Access: Stairs to enter Secretary/administrator of Steps: 14 Entrance Stairs-Rails: Right Home Layout: One level Prior Function Level of Independence: Independent Able to Take Stairs?: Yes Driving: Yes Communication Communication: No difficulties    Cognition  Overall Cognitive Status: Appears within functional limits for tasks assessed/performed Arousal/Alertness: Awake/alert Orientation Level: Appears intact for tasks assessed Behavior During Session: [name] for tasks performed    Extremity/Trunk Assessment Right Upper Extremity Assessment RUE ROM/Strength/Tone: [facility] for tasks assessed Left Upper Extremity Assessment LUE ROM/Strength/Tone: Deficits;Due to pain LUE ROM/Strength/Tone Deficits: L shoulder elevation limted by pain above 90*; L elbow/wrist/hand strength/ROM WFL LUE Sensation: WFL - Light Touch LUE Coordination: WFL - gross/fine motor Right Lower Extremity Assessment RLE ROM/Strength/Tone: WFL for tasks assessed Left Lower Extremity Assessment LLE ROM/Strength/Tone: WFL for tasks assessed Trunk Assessment Trunk Assessment: Normal   Balance    End of Session PT - End of Session Activity Tolerance: Patient tolerated treatment well Patient left: in bed;with call bell/phone within reach Nurse Communication: Mobility status  GP     Ralene Bathe Kistler 08/19/2011, 3:12 PM [phone]

## 2011-08-20 DIAGNOSIS — J9819 Other pulmonary collapse: Secondary | ICD-10-CM | POA: Diagnosis not present

## 2011-08-20 DIAGNOSIS — M87029 Idiopathic aseptic necrosis of unspecified humerus: Secondary | ICD-10-CM | POA: Diagnosis not present

## 2011-08-20 DIAGNOSIS — J189 Pneumonia, unspecified organism: Secondary | ICD-10-CM | POA: Diagnosis not present

## 2011-08-20 DIAGNOSIS — D5701 Hb-SS disease with acute chest syndrome: Secondary | ICD-10-CM | POA: Diagnosis not present

## 2011-08-20 DIAGNOSIS — D72829 Elevated white blood cell count, unspecified: Secondary | ICD-10-CM | POA: Diagnosis not present

## 2011-08-20 DIAGNOSIS — D5700 Hb-SS disease with crisis, unspecified: Secondary | ICD-10-CM | POA: Diagnosis not present

## 2011-08-20 LAB — COMPREHENSIVE METABOLIC PANEL
Albumin: 3.8 g/dL (ref 3.5–5.2)
BUN: 11 mg/dL (ref 6–23)
CO2: 25 mEq/L (ref 19–32)
Calcium: 8.7 mg/dL (ref 8.4–10.5)
Chloride: 100 mEq/L (ref 96–112)
Creatinine, Ser: 0.61 mg/dL (ref 0.50–1.35)
GFR calc non Af Amer: >90 mL/min (ref >90)
Total Bilirubin: 2.7 mg/dL — ABNORMAL HIGH (ref 0.3–1.2)

## 2011-08-20 LAB — CBC
HCT: 21.4 % — ABNORMAL LOW (ref 39.0–52.0)
MCH: 29.9 pg (ref 26.0–34.0)
MCV: 87.7 fL (ref 78.0–100.0)
RDW: 18.4 % — ABNORMAL HIGH (ref 11.5–15.5)
WBC: 17.3 10*3/uL — ABNORMAL HIGH (ref 4.0–10.5)

## 2011-08-20 MED ORDER — PANTOPRAZOLE SODIUM 40 MG PO TBEC
40.0000 mg | DELAYED_RELEASE_TABLET | Freq: Two times a day (BID) | ORAL | Status: DC
  Administered 2011-08-20 – 2011-08-21 (×2): 40 mg via ORAL
  Filled 2011-08-20 (×4): qty 1

## 2011-08-20 NOTE — Progress Notes (Signed)
Subjective:  Patient feeling better today. He rates his pain as a 6/10. He continues to have shoulder pain which did not change with the injections. No other new complaints. No nausea vomiting. He feels he may be able to manage his pain at home by tomorrow.   Allergies  Allergen Reactions  . Morphine And Related Hives and Rash    Pt states he is able to tolerate Dilaudid with no reactions.   Current Facility-Administered Medications  Medication Dose Route Frequency Provider Last Rate Last Dose  . 0.45 % NaCl with KCl 20 mEq / L infusion   Intravenous Continuous Gwenyth Bender, MD 100 mL/hr at 08/20/11 1200    . cefTRIAXone (ROCEPHIN) 1 g in dextrose 5 % 50 mL IVPB  1 g Intravenous Q24H Gwenyth Bender, MD   1 g at 08/19/11 1948  . cyclobenzaprine (FLEXERIL) tablet 5 mg  5 mg Oral TID PRN Grayce Sessions, NP   5 mg at 08/20/11 0127  . diphenhydrAMINE (BENADRYL) capsule 25-50 mg  25-50 mg Oral Q4H PRN Gwenyth Bender, MD       Or  . diphenhydrAMINE (BENADRYL) injection 12.5-25 mg  12.5-25 mg Intravenous Q4H PRN Gwenyth Bender, MD   25 mg at 08/20/11 1405  . enoxaparin (LOVENOX) injection 40 mg  40 mg Subcutaneous Q24H Grayce Sessions, NP   40 mg at 08/20/11 1611  . folic acid (FOLVITE) tablet 1 mg  1 mg Oral Daily Gwenyth Bender, MD   1 mg at 08/20/11 0955  . HYDROmorphone (DILAUDID) injection 2-4 mg  2-4 mg Intravenous Q2H PRN Gwenyth Bender, MD   4 mg at 08/20/11 1611  . hydroxyurea (HYDREA) capsule 500 mg  500 mg Oral BID Gwenyth Bender, MD   500 mg at 08/20/11 0956  . ibuprofen (ADVIL,MOTRIN) tablet 800 mg  800 mg Oral TID Gwenyth Bender, MD   800 mg at 08/20/11 1610  . ketorolac (TORADOL) 30 MG/ML injection 30 mg  30 mg Intravenous Q6H PRN Grayce Sessions, NP   30 mg at 08/20/11 1200  . OLANZapine (ZYPREXA) tablet 10 mg  10 mg Oral QHS Gwenyth Bender, MD   10 mg at 08/19/11 2159  . ondansetron (ZOFRAN) tablet 4 mg  4 mg Oral Q4H PRN Gwenyth Bender, MD       Or  . ondansetron [facility]) injection 4 mg  4 mg  Intravenous Q4H PRN Gwenyth Bender, MD   4 mg at 08/20/11 1405  . oxyCODONE (OXYCONTIN) 12 hr tablet 80 mg  80 mg Oral Q12H Gwenyth Bender, MD   80 mg at 08/20/11 0745  . potassium chloride SA (K-DUR,KLOR-CON) CR tablet 20 mEq  20 mEq Oral Daily Gwenyth Bender, MD   20 mEq at 08/20/11 0956  . zolpidem (AMBIEN) tablet 10 mg  10 mg Oral QHS PRN Gwenyth Bender, MD   10 mg at 08/17/11 2329    Objective: Blood pressure 127/80, pulse 80, temperature 98.5 F (36.9 C), temperature source Oral, resp. rate 18, height 6' (1.829 m), weight 191 lb 9.3 oz (86.9 kg), SpO2 97.00%.  Well-developed well-nourished black male in no acute distress. HEENT: No sinus tenderness. Minimal sclera icterus. NECK: No enlarged thyroid. No posterior cervical nodes. LUNGS: Clear to auscultation. No vocal fremitus. CV: Normal S1, S2 without S3. ABD: Positive epigastric tenderness. Tenderness along the lower costochondral margins bilaterally. MSK: Tenderness in shoulders to palpation left greater than right. Negative  Homans. NEURO: Intact.  Lab results: Results for orders placed during the hospital encounter of 08/13/11 (from the past 48 hour(s))  CBC     Status: Abnormal   Collection Time   08/19/11  3:51 AM      Component Value Range Comment   WBC 19.2 (*) 4.0 - 10.5 K/uL    RBC 2.54 (*) 4.22 - 5.81 MIL/uL    Hemoglobin 7.6 (*) 13.0 - 17.0 g/dL    HCT 86.5 (*) 78.4 - 52.0 %    MCV 87.0  78.0 - 100.0 fL    MCH 29.9  26.0 - 34.0 pg    MCHC 34.4  30.0 - 36.0 g/dL    RDW 69.6 (*) 29.5 - 15.5 %    Platelets 443 (*) 150 - 400 K/uL   COMPREHENSIVE METABOLIC PANEL     Status: Abnormal   Collection Time   08/19/11  3:51 AM      Component Value Range Comment   Sodium 135  135 - 145 mEq/L    Potassium 4.4  3.5 - 5.1 mEq/L    Chloride 100  96 - 112 mEq/L    CO2 25  19 - 32 mEq/L    Glucose, Bld 109 (*) 70 - 99 mg/dL    BUN 12  6 - 23 mg/dL    Creatinine, Ser 2.84  0.50 - 1.35 mg/dL    Calcium 8.9  8.4 - 13.2 mg/dL    Total  Protein 7.6  6.0 - 8.3 g/dL    Albumin 3.9  3.5 - 5.2 g/dL    AST 82 (*) 0 - 37 U/L    ALT 74 (*) 0 - 53 U/L    Alkaline Phosphatase 129 (*) 39 - 117 U/L    Total Bilirubin 3.8 (*) 0.3 - 1.2 mg/dL    GFR calc non Af Amer >90  >90 mL/min    GFR calc Af Amer >90  >90 mL/min   CBC     Status: Abnormal   Collection Time   08/20/11  5:00 AM      Component Value Range Comment   WBC 17.3 (*) 4.0 - 10.5 K/uL    RBC 2.44 (*) 4.22 - 5.81 MIL/uL    Hemoglobin 7.3 (*) 13.0 - 17.0 g/dL    HCT 44.0 (*) 10.2 - 52.0 %    MCV 87.7  78.0 - 100.0 fL    MCH 29.9  26.0 - 34.0 pg    MCHC 34.1  30.0 - 36.0 g/dL    RDW 72.5 (*) 36.6 - 15.5 %    Platelets 451 (*) 150 - 400 K/uL   COMPREHENSIVE METABOLIC PANEL     Status: Abnormal   Collection Time   08/20/11  5:00 AM      Component Value Range Comment   Sodium 134 (*) 135 - 145 mEq/L    Potassium 4.0  3.5 - 5.1 mEq/L    Chloride 100  96 - 112 mEq/L    CO2 25  19 - 32 mEq/L    Glucose, Bld 129 (*) 70 - 99 mg/dL    BUN 11  6 - 23 mg/dL    Creatinine, Ser 4.40  0.50 - 1.35 mg/dL    Calcium 8.7  8.4 - 34.7 mg/dL    Total Protein 7.3  6.0 - 8.3 g/dL    Albumin 3.8  3.5 - 5.2 g/dL    AST 67 (*) 0 - 37 U/L    ALT 70 (*)  0 - 53 U/L    Alkaline Phosphatase 128 (*) 39 - 117 U/L    Total Bilirubin 2.7 (*) 0.3 - 1.2 mg/dL    GFR calc non Af Amer >90  >90 mL/min    GFR calc Af Amer >90  >90 mL/min     Studies/Results: No results found.  Patient Active Problem List  Diagnosis  . Sickle cell pain crisis  . Leukocytosis  . Chronic pain  . Hypokalemia  . Metabolic acidosis  . Thrombocytosis  . Hemochromatosis  . Hypomagnesemia  . Acute chest syndrome due to sickle cell crisis  . Diarrhea  . Elevated brain natriuretic peptide (BNP) level  . Bronchitis  . Sickle cell anemia  . Acute embolism and thrombosis of right internal jugular vein  . Avascular necrosis  . Metabolic encephalopathy  . Increased ammonia level    Impression: Slowly resolving  crisis. Muscle skeletal chest pain secondary 1. Avascular necrosis of the shoulders. Status post intra-articular steroid injection. Abdominal pain. Rule out mild gastritis. Patient on Toradol and ibuprofen.   Plan: Place patient on Protonix 40 mg twice a day. DC Toradol. Continue ibuprofen. Followup CBC in [facility] in a.m. Home within 24-48 hours if stable.   [name], Lareina Espino 08/20/2011 4:44 PM

## 2011-08-21 ENCOUNTER — Inpatient Hospital Stay (HOSPITAL_COMMUNITY): Payer: Medicare Other

## 2011-08-21 DIAGNOSIS — D72829 Elevated white blood cell count, unspecified: Secondary | ICD-10-CM | POA: Diagnosis not present

## 2011-08-21 DIAGNOSIS — D5701 Hb-SS disease with acute chest syndrome: Secondary | ICD-10-CM | POA: Diagnosis not present

## 2011-08-21 DIAGNOSIS — M87029 Idiopathic aseptic necrosis of unspecified humerus: Secondary | ICD-10-CM | POA: Diagnosis not present

## 2011-08-21 DIAGNOSIS — J189 Pneumonia, unspecified organism: Secondary | ICD-10-CM | POA: Diagnosis not present

## 2011-08-21 DIAGNOSIS — D5700 Hb-SS disease with crisis, unspecified: Secondary | ICD-10-CM | POA: Diagnosis not present

## 2011-08-21 DIAGNOSIS — J984 Other disorders of lung: Secondary | ICD-10-CM | POA: Diagnosis not present

## 2011-08-21 DIAGNOSIS — J9819 Other pulmonary collapse: Secondary | ICD-10-CM | POA: Diagnosis not present

## 2011-08-21 LAB — COMPREHENSIVE METABOLIC PANEL
ALT: 67 U/L — ABNORMAL HIGH (ref 0–53)
AST: 57 U/L — ABNORMAL HIGH (ref 0–37)
Alkaline Phosphatase: 126 U/L — ABNORMAL HIGH (ref 39–117)
CO2: 25 mEq/L (ref 19–32)
Chloride: 102 mEq/L (ref 96–112)
GFR calc non Af Amer: >90 mL/min (ref >90)
Sodium: 136 mEq/L (ref 135–145)
Total Bilirubin: 2.6 mg/dL — ABNORMAL HIGH (ref 0.3–1.2)

## 2011-08-21 LAB — CBC
Platelets: 442 10*3/uL — ABNORMAL HIGH (ref 150–400)
RBC: 2.55 MIL/uL — ABNORMAL LOW (ref 4.22–5.81)
WBC: 15.9 10*3/uL — ABNORMAL HIGH (ref 4.0–10.5)

## 2011-08-21 MED ORDER — PANTOPRAZOLE SODIUM 40 MG PO TBEC: 40.0000 mg | DELAYED_RELEASE_TABLET | Freq: Two times a day (BID) | ORAL | Status: DC

## 2011-08-21 MED ORDER — HYDROMORPHONE HCL 4 MG PO TABS: 4.0000 mg | ORAL_TABLET | ORAL | Status: DC | PRN

## 2011-08-21 MED ORDER — ZOLPIDEM TARTRATE 10 MG PO TABS
10.0000 mg | ORAL_TABLET | Freq: Every evening | ORAL | Status: DC | PRN
Start: 2011-08-21 — End: 2011-09-04

## 2011-08-21 MED ORDER — TESTOSTERONE 5 MG/24HR TD PT24: 1.0000 | MEDICATED_PATCH | Freq: Every day | TRANSDERMAL | Status: DC

## 2011-08-21 MED ORDER — CYCLOBENZAPRINE HCL 5 MG PO TABS: 5.0000 mg | ORAL_TABLET | Freq: Three times a day (TID) | ORAL | Status: AC | PRN

## 2011-08-21 MED ORDER — OLANZAPINE 10 MG PO TABS: 10.0000 mg | ORAL_TABLET | Freq: Every day | ORAL | Status: DC

## 2011-08-21 MED ORDER — OXYCODONE HCL 80 MG PO TB12: 80.0000 mg | ORAL_TABLET | Freq: Two times a day (BID) | ORAL | Status: DC

## 2011-08-21 NOTE — Progress Notes (Signed)
Subjective:  Patient feeling better today. He still has left-sided chest pain. Pain however is 6/10. He has occasional nonproductive cough. He feels he can manage his pain at home. No other new complaints. On review his old blood tests from his last admission in June did reveal a low testosterone level. This was discussed with patient as well. He would like to have hormonal replacement if it is covered by Medicaid. No other new complaints.   Allergies  Allergen Reactions  . Morphine And Related Hives and Rash    Pt states he is able to tolerate Dilaudid with no reactions.   Current Facility-Administered Medications  Medication Dose Route Frequency Provider Last Rate Last Dose  . 0.45 % NaCl with KCl 20 mEq / L infusion   Intravenous Continuous Gwenyth Bender, MD 75 mL/hr at 08/21/11 0906    . cefTRIAXone (ROCEPHIN) 1 g in dextrose 5 % 50 mL IVPB  1 g Intravenous Q24H Gwenyth Bender, MD   1 g at 08/20/11 2019  . cyclobenzaprine (FLEXERIL) tablet 5 mg  5 mg Oral TID PRN Grayce Sessions, NP   5 mg at 08/20/11 0127  . diphenhydrAMINE (BENADRYL) capsule 25-50 mg  25-50 mg Oral Q4H PRN Gwenyth Bender, MD       Or  . diphenhydrAMINE (BENADRYL) injection 12.5-25 mg  12.5-25 mg Intravenous Q4H PRN Gwenyth Bender, MD   12.5 mg at 08/21/11 1129  . enoxaparin (LOVENOX) injection 40 mg  40 mg Subcutaneous Q24H Grayce Sessions, NP   40 mg at 08/20/11 1611  . folic acid (FOLVITE) tablet 1 mg  1 mg Oral Daily Gwenyth Bender, MD   1 mg at 08/21/11 1007  . HYDROmorphone (DILAUDID) injection 2-4 mg  2-4 mg Intravenous Q2H PRN Gwenyth Bender, MD   4 mg at 08/21/11 1332  . hydroxyurea (HYDREA) capsule 500 mg  500 mg Oral BID Gwenyth Bender, MD   500 mg at 08/21/11 1007  . ibuprofen (ADVIL,MOTRIN) tablet 800 mg  800 mg Oral TID Gwenyth Bender, MD   800 mg at 08/21/11 1007  . OLANZapine (ZYPREXA) tablet 10 mg  10 mg Oral QHS Gwenyth Bender, MD   10 mg at 08/20/11 2211  . ondansetron (ZOFRAN) tablet 4 mg  4 mg Oral Q4H PRN Gwenyth Bender,  MD       Or  . ondansetron [facility]) injection 4 mg  4 mg Intravenous Q4H PRN Gwenyth Bender, MD   4 mg at 08/21/11 1129  . oxyCODONE (OXYCONTIN) 12 hr tablet 80 mg  80 mg Oral Q12H Gwenyth Bender, MD   80 mg at 08/21/11 0857  . pantoprazole (PROTONIX) EC tablet 40 mg  40 mg Oral BID AC Gwenyth Bender, MD   40 mg at 08/21/11 0857  . potassium chloride SA (K-DUR,KLOR-CON) CR tablet 20 mEq  20 mEq Oral Daily Gwenyth Bender, MD   20 mEq at 08/21/11 1007  . zolpidem (AMBIEN) tablet 10 mg  10 mg Oral QHS PRN Gwenyth Bender, MD   10 mg at 08/17/11 2329  . DISCONTD: ketorolac (TORADOL) 30 MG/ML injection 30 mg  30 mg Intravenous Q6H PRN Grayce Sessions, NP   30 mg at 08/20/11 1200    Objective: Blood pressure 123/78, pulse 71, temperature 97.8 F (36.6 C), temperature source Oral, resp. rate 16, height 6' (1.829 m), weight 193 lb 12.6 oz (87.9 kg), SpO2 98.00%.  Well-developed well-nourished black  male in no acute distress. HEENT: No sinus tenderness. Minimal sclera icterus. NECK: No enlarged thyroid. No posterior cervical nodes. LUNGS: Clear except for decreased breath sounds at left base. Early vocal fremitus. CV: Normal S1, S2 without S3. ABD: No epigastric tenderness. Tenderness along the left costochondral margin process. MSK: Negative Homans. No edema. NEURO: Intact.  Lab results: Results for orders placed during the hospital encounter of 08/13/11 (from the past 48 hour(s))  CBC     Status: Abnormal   Collection Time   08/20/11  5:00 AM      Component Value Range Comment   WBC 17.3 (*) 4.0 - 10.5 K/uL    RBC 2.44 (*) 4.22 - 5.81 MIL/uL    Hemoglobin 7.3 (*) 13.0 - 17.0 g/dL    HCT 64.4 (*) 03.4 - 52.0 %    MCV 87.7  78.0 - 100.0 fL    MCH 29.9  26.0 - 34.0 pg    MCHC 34.1  30.0 - 36.0 g/dL    RDW 74.2 (*) 59.5 - 15.5 %    Platelets 451 (*) 150 - 400 K/uL   COMPREHENSIVE METABOLIC PANEL     Status: Abnormal   Collection Time   08/20/11  5:00 AM      Component Value Range Comment   Sodium 134  (*) 135 - 145 mEq/L    Potassium 4.0  3.5 - 5.1 mEq/L    Chloride 100  96 - 112 mEq/L    CO2 25  19 - 32 mEq/L    Glucose, Bld 129 (*) 70 - 99 mg/dL    BUN 11  6 - 23 mg/dL    Creatinine, Ser 6.38  0.50 - 1.35 mg/dL    Calcium 8.7  8.4 - 75.6 mg/dL    Total Protein 7.3  6.0 - 8.3 g/dL    Albumin 3.8  3.5 - 5.2 g/dL    AST 67 (*) 0 - 37 U/L    ALT 70 (*) 0 - 53 U/L    Alkaline Phosphatase 128 (*) 39 - 117 U/L    Total Bilirubin 2.7 (*) 0.3 - 1.2 mg/dL    GFR calc non Af Amer >90  >90 mL/min    GFR calc Af Amer >90  >90 mL/min   CBC     Status: Abnormal   Collection Time   08/21/11  6:25 AM      Component Value Range Comment   WBC 15.9 (*) 4.0 - 10.5 K/uL    RBC 2.55 (*) 4.22 - 5.81 MIL/uL    Hemoglobin 7.4 (*) 13.0 - 17.0 g/dL    HCT 43.3 (*) 29.5 - 52.0 %    MCV 87.8  78.0 - 100.0 fL    MCH 29.0  26.0 - 34.0 pg    MCHC 33.0  30.0 - 36.0 g/dL    RDW 18.8 (*) 41.6 - 15.5 %    Platelets 442 (*) 150 - 400 K/uL   COMPREHENSIVE METABOLIC PANEL     Status: Abnormal   Collection Time   08/21/11  6:25 AM      Component Value Range Comment   Sodium 136  135 - 145 mEq/L    Potassium 4.4  3.5 - 5.1 mEq/L    Chloride 102  96 - 112 mEq/L    CO2 25  19 - 32 mEq/L    Glucose, Bld 106 (*) 70 - 99 mg/dL    BUN 9  6 - 23 mg/dL    Creatinine,  Ser 0.63  0.50 - 1.35 mg/dL    Calcium 8.9  8.4 - 40.9 mg/dL    Total Protein 7.5  6.0 - 8.3 g/dL    Albumin 3.8  3.5 - 5.2 g/dL    AST 57 (*) 0 - 37 U/L    ALT 67 (*) 0 - 53 U/L    Alkaline Phosphatase 126 (*) 39 - 117 U/L    Total Bilirubin 2.6 (*) 0.3 - 1.2 mg/dL    GFR calc non Af Amer >90  >90 mL/min    GFR calc Af Amer >90  >90 mL/min     Studies/Results: Dg Chest 2 View  08/21/2011  *RADIOLOGY REPORT*  Clinical Data: Sickle cell crisis, shortness of breath, left chest pain  CHEST - 2 VIEW  Comparison: 08/17/2011  Findings: Right PICC line tip SVC RA junction.  Stable mild cardiomegaly with vascular prominence.  Slight increased basilar  streaky airspace disease/atelectasis.  No consolidation, edema, effusion or pneumothorax.  Trachea midline.  Prior cholecystectomy evident.  IMPRESSION: Slight increased basilar atelectasis / streaky airspace disease.  Original Report Authenticated By: Judie Petit. Ruel Favors, M.D.    Patient Active Problem List  Diagnosis  . Sickle cell pain crisis  . Leukocytosis  . Chronic pain  . Hypokalemia  . Metabolic acidosis  . Thrombocytosis  . Hemochromatosis  . Hypomagnesemia  . Acute chest syndrome due to sickle cell crisis  . Diarrhea  . Elevated brain natriuretic peptide (BNP) level  . Bronchitis  . Sickle cell anemia  . Acute embolism and thrombosis of right internal jugular vein  . Avascular necrosis  . Metabolic encephalopathy  . Increased ammonia level    Impression: Resolving sickle cell crisis. Sickle cell lung disease. Basilar atelectasis. This  remains a potential site for infection. Hypogonadism. This may be secondary to orchitis secondary to sickle cell crisis in the past. Avascular necrosis. Shoulder pain continue but is improving.   Plan Discharge home today. DC PICC line.  Empiric Zithromax. Topical testosterone replacement. Followup in office in 1 week's time.   [name], Shantella Blubaugh 08/21/2011 2:34 PM

## 2011-08-21 NOTE — Progress Notes (Signed)
PICC line d/c'ed per order for d/c to home.  PICC intact.  Vaseline pressure guaze dressing applied to site.  Pt verbalizes understanding of instructions to remain in bed for 30 minutes, signs of infection and when to remove dressing/ call MD- using teach back method.  No ADN.

## 2011-08-21 NOTE — Discharge Summary (Signed)
Physician Discharge Summary  Patient ID: [name] MRN: [id number] DOB/AGE: 07/12/1980 31 y.o.  Admit date: 08/13/2011 Discharge date: 08/21/2011   Discharge Diagnoses:   Sickle cell crisis Acute chest syndrome Hypokalemia Avascular necrosis of the shoulders Basilar atelectasis rule out early pneumonia. Hypogonadism.  Discharged Condition: Improved  Operations/Procedues: Intra-articular corticosteroid injections per [name] of the shoulders.   Hospital Course:  See admission H&P for details. Patient was started on IV fluids of half-normal saline  with KCl 20 mEq per liter at 125 cc an hour for rehydration. He  was  placed on IV Dilaudid 4 mg every 2 hours as needed for pain. He was given IV Zofran and Benadryl as needed for nausea and pruritus respectively. Due to his history of sickle cell lung disease he was placed on incentive spirometry and empiric antibiotics. Over the  subsequent  days he made slow but steady improvement. His shoulder pains however persisted. He was subsequently seen by [name] for further evaluation. X-rays did still confirm evidence for avascular necrosis with  no other associated complications. He underwent intra-articular steroid injections.  Patient was also seen by physical therapy and given specific shoulder exercises.  Patient noted this helped some but did not relieve his pain is completely. He is continued to be managed conservatively as well.  Patient's pain eventually decreased to the point that he felt he can manage his pain. He rated it  as a 6/10. A followup chest x-ray was obtained today due to mild persistent left basilar changes.  Followup x-ray of his chest revealed evidence for basilar atelectasis. No definite evidence for pneumonia as of 7/21.  Patient also notably on his last  hospitalization had a testosterone level drawn due to erectile dysfunction. He did  have a markedly low testosterone level.  A prescription for  replacement was written, if covered by Medicaid.  Patient discharged home improved. He would need followup in office  and with [name].    Disposition: Patient to be seen in office in 1 week's time.  Discharge Orders    Future Orders Please Complete By Expires   Diet - low sodium heart healthy      Increase activity slowly      No wound care      Call MD for:  severe uncontrolled pain      Discharge instructions      Comments:   Follow up with primary care physician in one week.     Medication List  As of 08/21/2011  3:05 PM   TAKE these medications         cyclobenzaprine 5 MG tablet   Commonly known as: FLEXERIL   Take 1 tablet (5 mg total) by mouth 3 (three) times daily as needed for muscle spasms.      folic acid 1 MG tablet   Commonly known as: FOLVITE   Take 1 mg by mouth daily.      HYDROmorphone 4 MG tablet   Commonly known as: DILAUDID   Take 1 tablet (4 mg total) by mouth every 4 (four) hours as needed. For pain.      hydroxyurea 500 MG capsule   Commonly known as: HYDREA   Take 500 mg by mouth 2 (two) times daily. May take with food to minimize GI side effects.      ibuprofen 800 MG tablet   Commonly known as: ADVIL,MOTRIN   Take 800 mg by mouth every 8 (eight) hours as needed. For  pain.      OLANZapine 10 MG tablet   Commonly known as: ZYPREXA   Take 1 tablet (10 mg total) by mouth at bedtime.      oxyCODONE 80 MG 12 hr tablet   Commonly known as: OXYCONTIN   Take 1 tablet (80 mg total) by mouth every 12 (twelve) hours.      pantoprazole 40 MG tablet   Commonly known as: PROTONIX   Take 1 tablet (40 mg total) by mouth 2 (two) times daily before a meal.      potassium chloride SA 20 MEQ tablet   Commonly known as: K-DUR,KLOR-CON   Take 1 tablet (20 mEq total) by mouth daily.      testosterone 5 MG/24HR   Commonly known as: ANDRODERM   Place 1 patch onto the skin daily.      zolpidem 10 MG tablet   Commonly known as: AMBIEN   Take 1 tablet (10  mg total) by mouth at bedtime as needed. For sleep.           Follow-up Information    Follow up with Gerald Post, MD. Schedule an appointment as soon as possible for a visit in 2 weeks.   Contact information:   [facility] [address] [place] [address] [phone]         Total time for discharge and coordination of care greater than 35 minutes.  SignedAugust [name], [name] 08/21/2011, 3:05 PM

## 2011-09-04 ENCOUNTER — Emergency Department (HOSPITAL_COMMUNITY)
Admission: EM | Admit: 2011-09-04 | Discharge: 2011-09-04 | Disposition: A | Discharge status: Other Acute Inpatient Hospital | Payer: Medicare Other | Source: Home / Self Care | Attending: Emergency Medicine | Admitting: Emergency Medicine

## 2011-09-04 ENCOUNTER — Encounter (HOSPITAL_COMMUNITY): Payer: Self-pay

## 2011-09-04 ENCOUNTER — Emergency Department (HOSPITAL_COMMUNITY): Payer: Medicare Other

## 2011-09-04 ENCOUNTER — Encounter (HOSPITAL_COMMUNITY): Payer: Self-pay | Admitting: *Deleted

## 2011-09-04 ENCOUNTER — Inpatient Hospital Stay (HOSPITAL_COMMUNITY)
Admission: AD | Admit: 2011-09-04 | Discharge: 2011-09-14 | DRG: 812 | Disposition: A | Discharge status: Home / Self Care | Payer: Medicare Other | Attending: Internal Medicine | Admitting: Internal Medicine

## 2011-09-04 DIAGNOSIS — Z79899 Other long term (current) drug therapy: Secondary | ICD-10-CM | POA: Insufficient documentation

## 2011-09-04 DIAGNOSIS — G4700 Insomnia, unspecified: Secondary | ICD-10-CM | POA: Diagnosis present

## 2011-09-04 DIAGNOSIS — R079 Chest pain, unspecified: Secondary | ICD-10-CM | POA: Insufficient documentation

## 2011-09-04 DIAGNOSIS — I2782 Chronic pulmonary embolism: Secondary | ICD-10-CM | POA: Diagnosis present

## 2011-09-04 DIAGNOSIS — D5700 Hb-SS disease with crisis, unspecified: Principal | ICD-10-CM | POA: Diagnosis present

## 2011-09-04 DIAGNOSIS — D5701 Hb-SS disease with acute chest syndrome: Secondary | ICD-10-CM

## 2011-09-04 DIAGNOSIS — R7401 Elevation of levels of liver transaminase levels: Secondary | ICD-10-CM | POA: Diagnosis not present

## 2011-09-04 DIAGNOSIS — E876 Hypokalemia: Secondary | ICD-10-CM | POA: Diagnosis present

## 2011-09-04 DIAGNOSIS — M7989 Other specified soft tissue disorders: Secondary | ICD-10-CM

## 2011-09-04 DIAGNOSIS — D72829 Elevated white blood cell count, unspecified: Secondary | ICD-10-CM | POA: Diagnosis not present

## 2011-09-04 DIAGNOSIS — Z7901 Long term (current) use of anticoagulants: Secondary | ICD-10-CM

## 2011-09-04 DIAGNOSIS — Z9089 Acquired absence of other organs: Secondary | ICD-10-CM | POA: Insufficient documentation

## 2011-09-04 DIAGNOSIS — Z87891 Personal history of nicotine dependence: Secondary | ICD-10-CM | POA: Insufficient documentation

## 2011-09-04 DIAGNOSIS — D571 Sickle-cell disease without crisis: Secondary | ICD-10-CM | POA: Diagnosis not present

## 2011-09-04 DIAGNOSIS — M2550 Pain in unspecified joint: Secondary | ICD-10-CM | POA: Diagnosis not present

## 2011-09-04 DIAGNOSIS — M729 Fibroblastic disorder, unspecified: Secondary | ICD-10-CM | POA: Diagnosis not present

## 2011-09-04 DIAGNOSIS — Z5189 Encounter for other specified aftercare: Secondary | ICD-10-CM | POA: Diagnosis not present

## 2011-09-04 DIAGNOSIS — D7289 Other specified disorders of white blood cells: Secondary | ICD-10-CM | POA: Diagnosis not present

## 2011-09-04 DIAGNOSIS — R17 Unspecified jaundice: Secondary | ICD-10-CM | POA: Diagnosis not present

## 2011-09-04 DIAGNOSIS — M87029 Idiopathic aseptic necrosis of unspecified humerus: Secondary | ICD-10-CM | POA: Diagnosis present

## 2011-09-04 DIAGNOSIS — Z96649 Presence of unspecified artificial hip joint: Secondary | ICD-10-CM

## 2011-09-04 LAB — CBC WITH DIFFERENTIAL/PLATELET
Basophils Relative: 1 % (ref 0–1)
Eosinophils Absolute: 0.4 10*3/uL (ref 0.0–0.7)
Eosinophils Relative: 2 % (ref 0–5)
HCT: 27.4 % — ABNORMAL LOW (ref 39.0–52.0)
Hemoglobin: 9.2 g/dL — ABNORMAL LOW (ref 13.0–17.0)
Lymphocytes Relative: 15 % (ref 12–46)
Monocytes Relative: 11 % (ref 3–12)
Neutro Abs: 15.5 10*3/uL — ABNORMAL HIGH (ref 1.7–7.7)
Neutrophils Relative %: 71 % (ref 43–77)
RBC: 3.04 MIL/uL — ABNORMAL LOW (ref 4.22–5.81)
WBC: 21.8 10*3/uL — ABNORMAL HIGH (ref 4.0–10.5)

## 2011-09-04 LAB — COMPREHENSIVE METABOLIC PANEL
ALT: 25 U/L (ref 0–53)
Alkaline Phosphatase: 131 U/L — ABNORMAL HIGH (ref 39–117)
BUN: 5 mg/dL — ABNORMAL LOW (ref 6–23)
Chloride: 103 mEq/L (ref 96–112)
GFR calc Af Amer: >90 mL/min (ref >90)
Glucose, Bld: 93 mg/dL (ref 70–99)
Potassium: 3 mEq/L — ABNORMAL LOW (ref 3.5–5.1)
Sodium: 137 mEq/L (ref 135–145)
Total Bilirubin: 3.2 mg/dL — ABNORMAL HIGH (ref 0.3–1.2)
Total Protein: 8.2 g/dL (ref 6.0–8.3)

## 2011-09-04 LAB — RETICULOCYTES: Retic Ct Pct: 13.8 % — ABNORMAL HIGH (ref 0.4–3.1)

## 2011-09-04 IMAGING — CR DG CHEST 1V
1 series · 1 of 1 positions shown · non-contrast
Comparison: 10/09/2008

CLINICAL DATA: Sickle cell crisis

CHEST - 1 VIEW

[w chest pa]
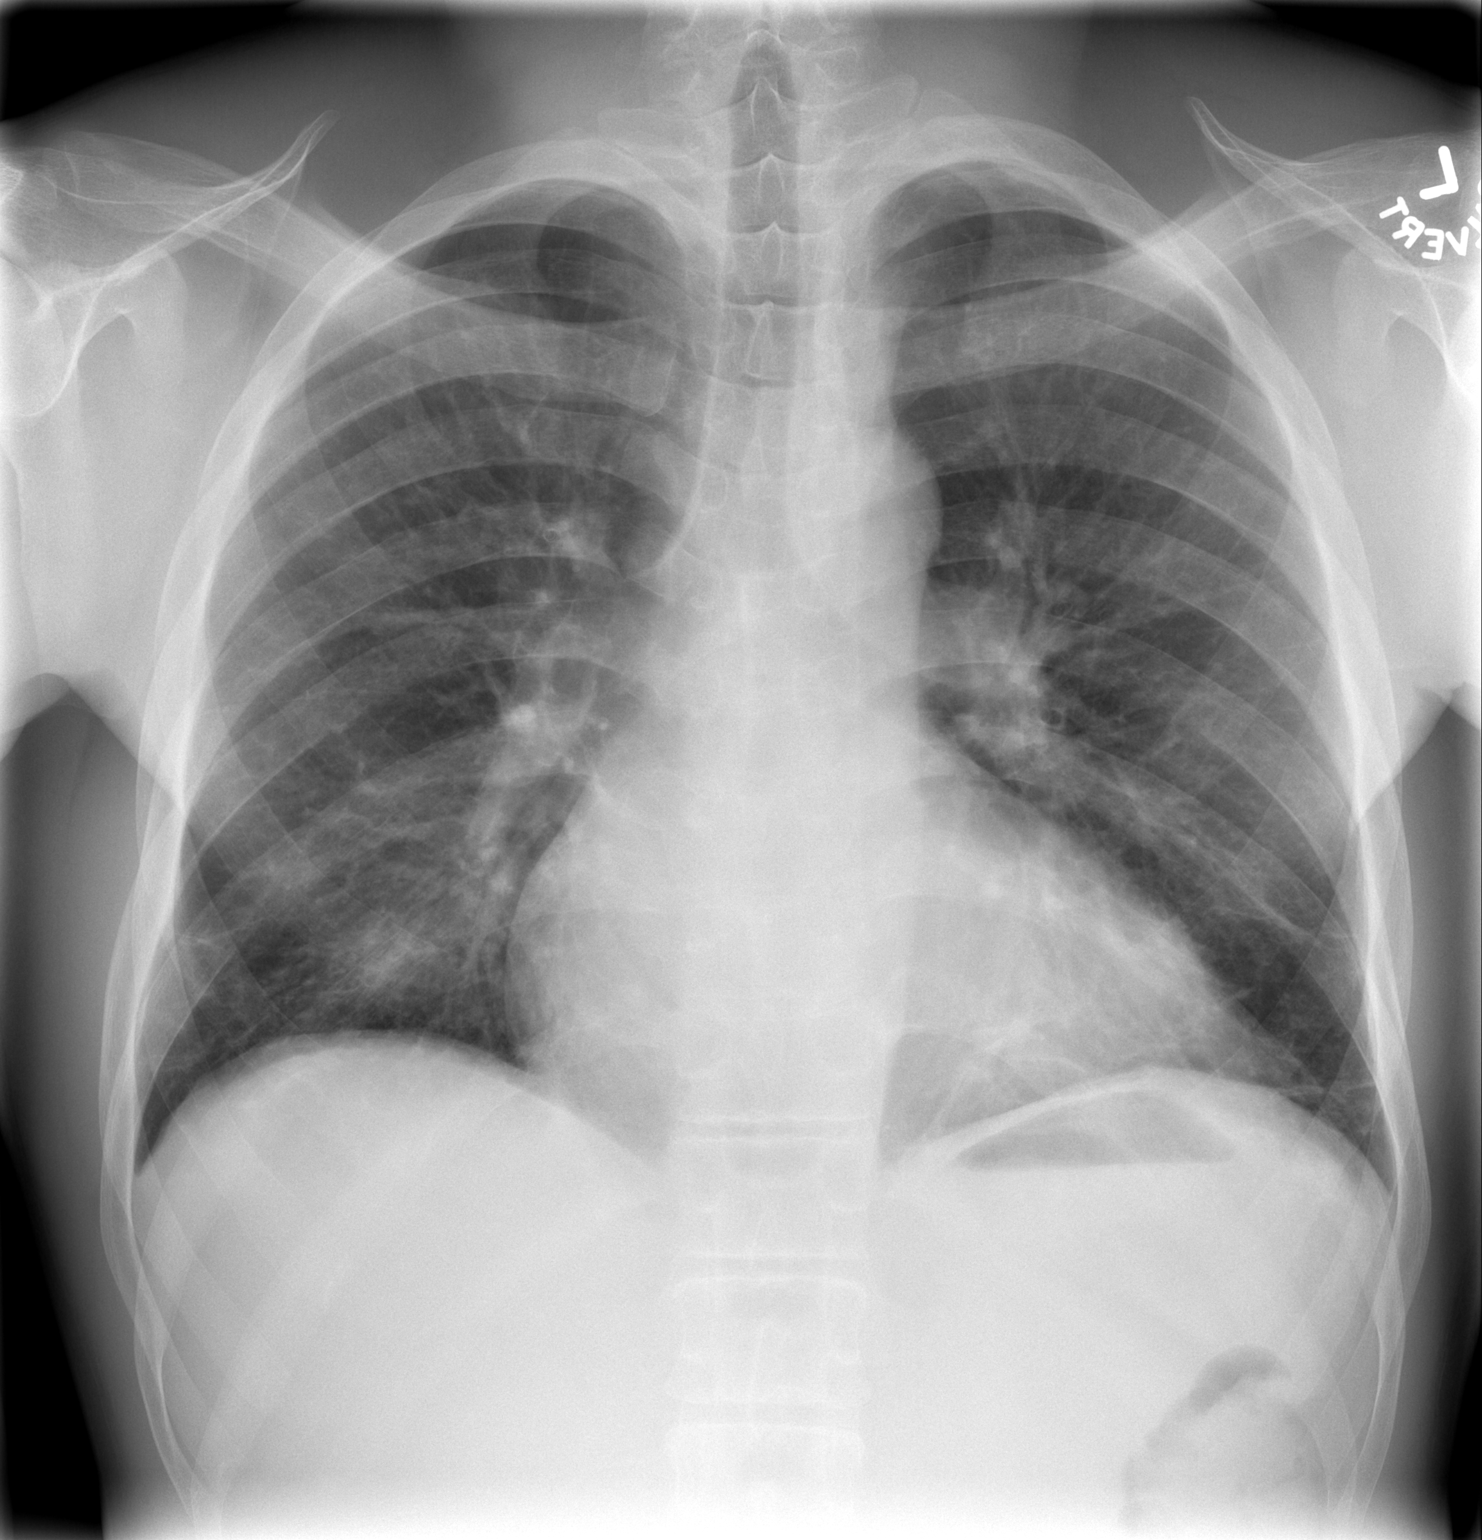

[1 of 1 positions shown; findings below may reference images not displayed]

FINDINGS: Cardiac enlargement.

No pleural effusions or pulmonary edema.

Bibasilar densities are noted.

The upper lobes are clear.

Skeletal changes of sickle cell disease noted.
IMPRESSION: 1. Cardiac enlargement and skeletal changes of sickle cell disease
2.  Bibasilar densities which may represent scarring, atelectasis
or areas of pulmonary infarction.

## 2011-09-04 IMAGING — CR DG SHOULDER 2+V*L*
3 series · 3 of 3 positions shown · non-contrast
Comparison: 12/31/2007

CLINICAL DATA: evaluate for avascular necrosis

LEFT SHOULDER - 2+ VIEW

[w shoulder ap internal left]
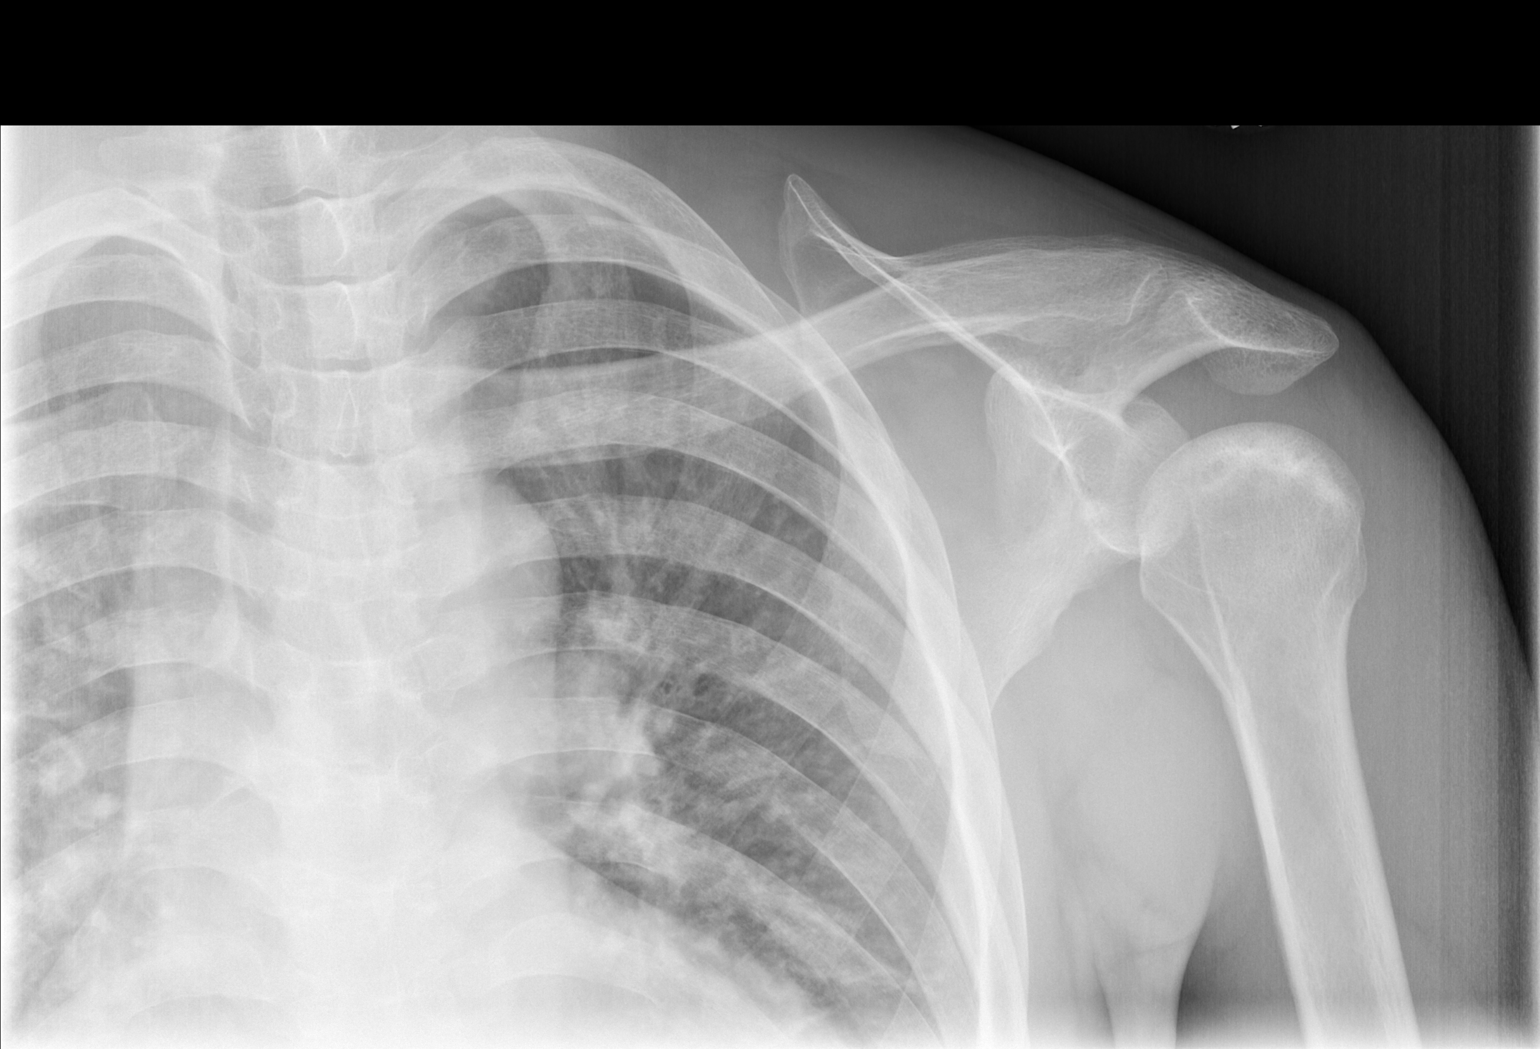

[w shoulder ap external left]
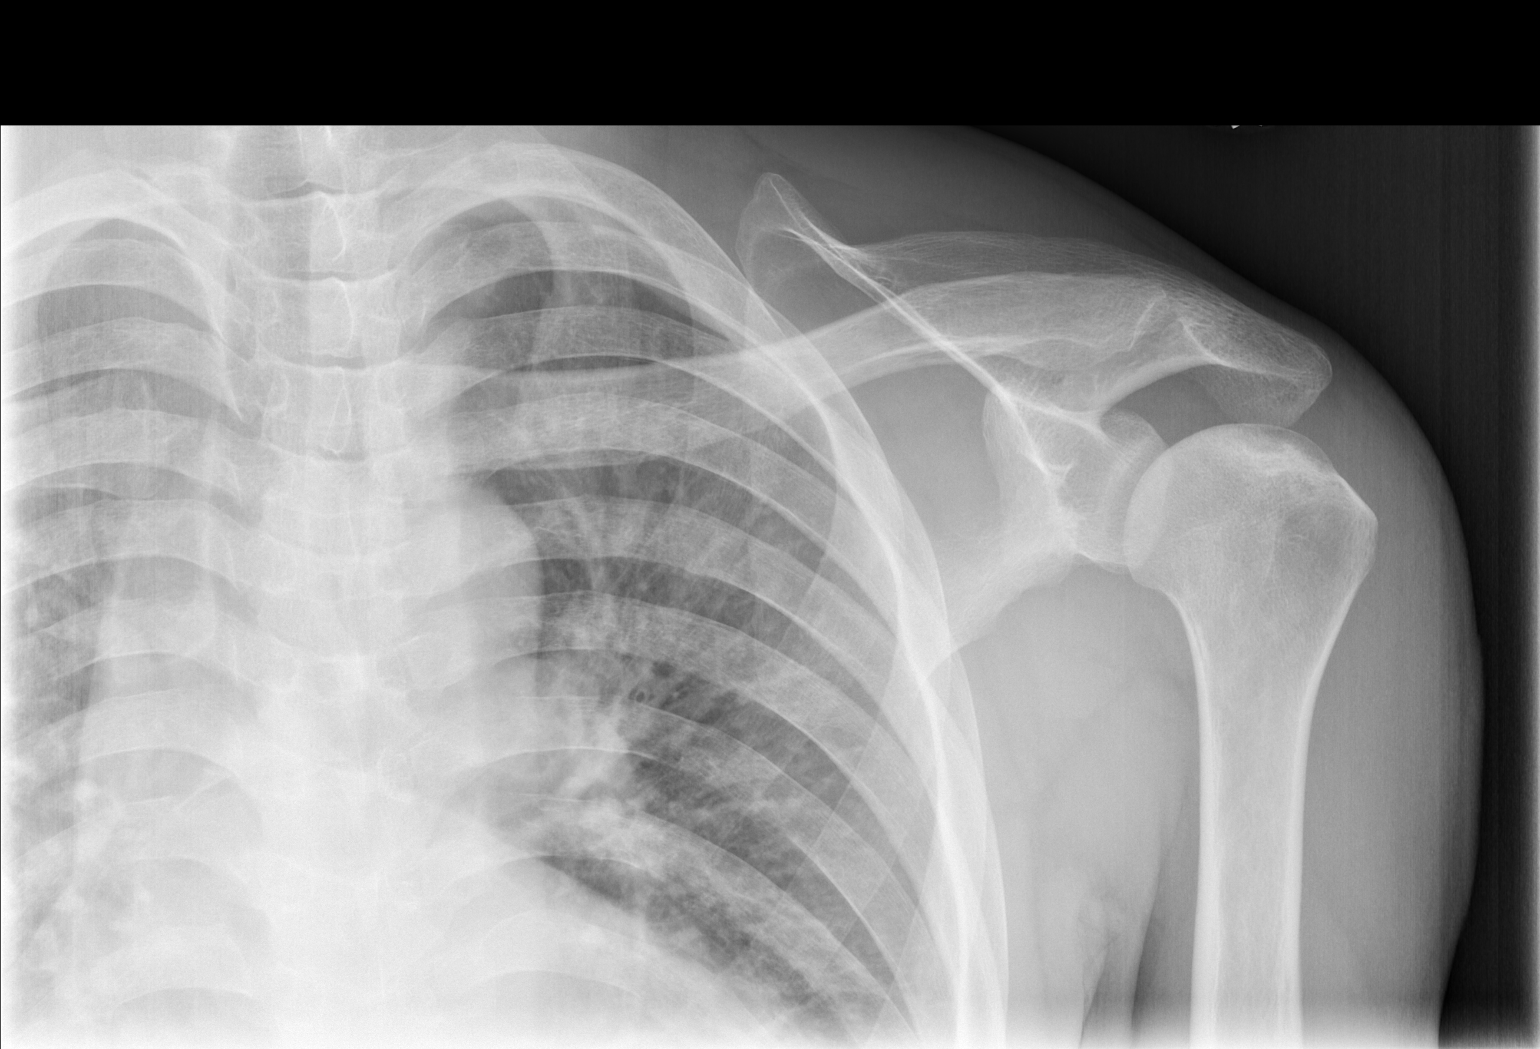

[w shoulder y view left]
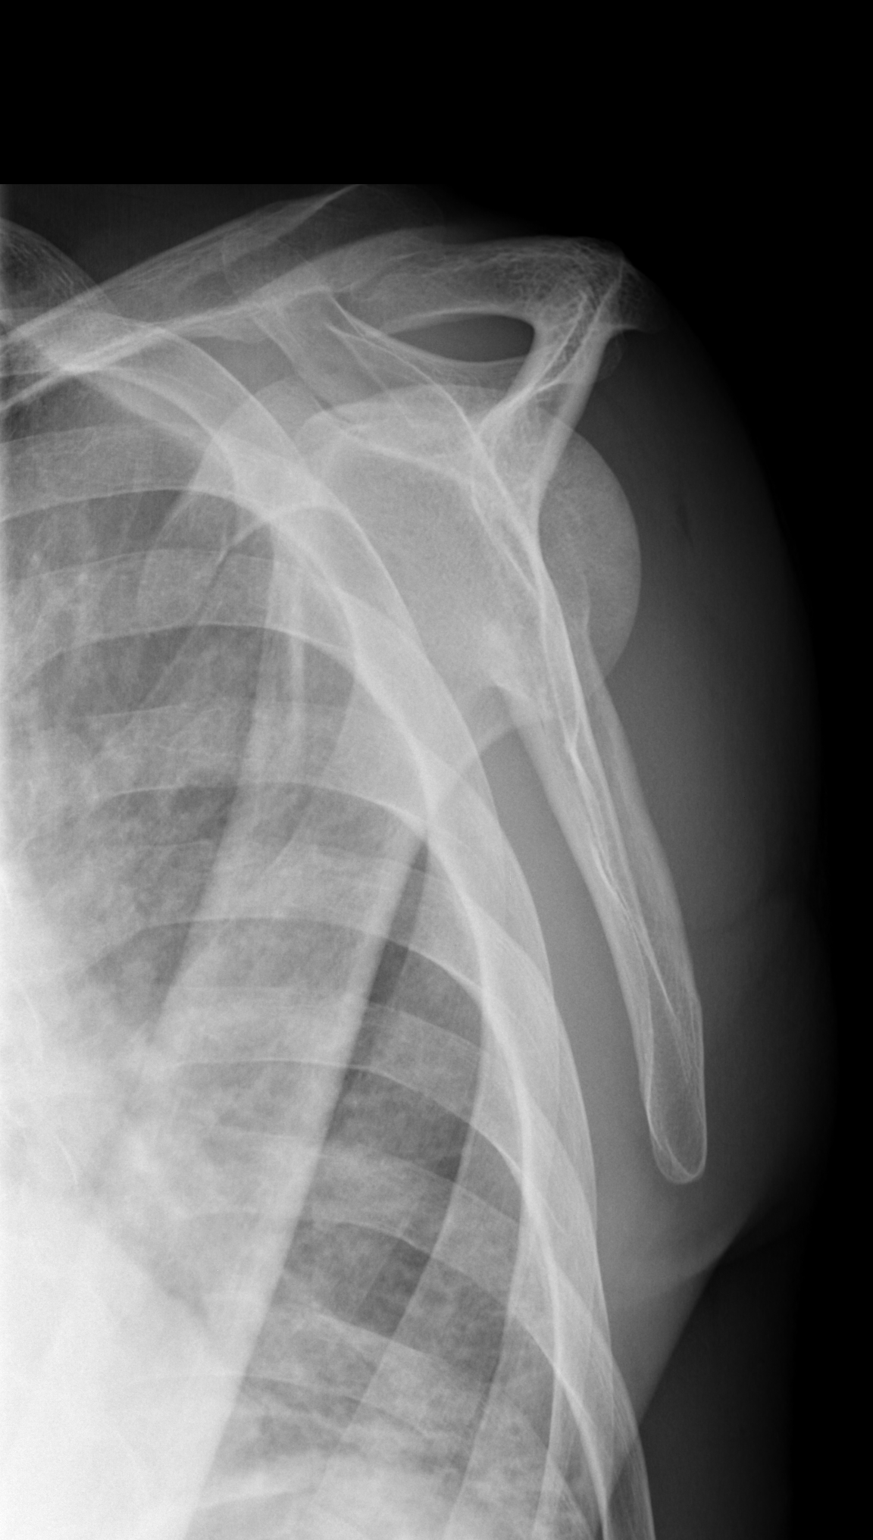

[3 of 3 positions shown; findings below may reference images not displayed]

FINDINGS: There is a band of sclerosis which extends along the
undersurface of the humeral head consistent with avascular
necrosis.  There is no evidence for bony collapsed. The appearance
is similar to previous exam. No acute fractures or dislocations
identified.
IMPRESSION: 1.  Findings consistent with AVN affecting the left humeral head.

## 2011-09-04 MED ORDER — ZOLPIDEM TARTRATE 10 MG PO TABS
10.0000 mg | ORAL_TABLET | Freq: Every evening | ORAL | Status: DC | PRN
  Administered 2011-09-06 – 2011-09-07 (×2): 10 mg via ORAL
  Filled 2011-09-04 (×2): qty 1

## 2011-09-04 MED ORDER — POTASSIUM CHLORIDE CRYS ER 20 MEQ PO TBCR: 20.0000 meq | EXTENDED_RELEASE_TABLET | Freq: Every day | ORAL | Status: DC

## 2011-09-04 MED ORDER — OXYCODONE HCL 40 MG PO TB12
80.0000 mg | ORAL_TABLET | Freq: Two times a day (BID) | ORAL | Status: DC
  Administered 2011-09-04 – 2011-09-14 (×20): 80 mg via ORAL
  Filled 2011-09-04: qty 1
  Filled 2011-09-04 (×5): qty 2
  Filled 2011-09-04: qty 1
  Filled 2011-09-04 (×14): qty 2

## 2011-09-04 MED ORDER — DIPHENHYDRAMINE HCL 50 MG/ML IJ SOLN
12.5000 mg | INTRAMUSCULAR | Status: DC | PRN
  Administered 2011-09-04 (×2): 25 mg via INTRAVENOUS
  Administered 2011-09-05: 12.5 mg via INTRAVENOUS
  Administered 2011-09-05: 25 mg via INTRAVENOUS
  Administered 2011-09-05: 12.5 mg via INTRAVENOUS
  Administered 2011-09-05: 25 mg via INTRAVENOUS
  Administered 2011-09-05 – 2011-09-06 (×7): 12.5 mg via INTRAVENOUS
  Administered 2011-09-06: 25 mg via INTRAVENOUS
  Administered 2011-09-07 (×3): 12.5 mg via INTRAVENOUS
  Administered 2011-09-07: 25 mg via INTRAVENOUS
  Administered 2011-09-07 – 2011-09-08 (×3): 12.5 mg via INTRAVENOUS
  Administered 2011-09-08 (×3): 25 mg via INTRAVENOUS
  Administered 2011-09-08: 12.5 mg via INTRAVENOUS
  Administered 2011-09-09 – 2011-09-14 (×31): 25 mg via INTRAVENOUS
  Filled 2011-09-04 (×58): qty 1

## 2011-09-04 MED ORDER — DEXTROSE-NACL 5-0.45 % IV SOLN
INTRAVENOUS | Status: DC
  Administered 2011-09-04 – 2011-09-06 (×4): via INTRAVENOUS
  Administered 2011-09-07: 1000 mL via INTRAVENOUS
  Administered 2011-09-07: 14:00:00 via INTRAVENOUS
  Administered 2011-09-08: 1000 mL via INTRAVENOUS
  Administered 2011-09-08 (×2): via INTRAVENOUS
  Administered 2011-09-09: 1000 mL via INTRAVENOUS
  Administered 2011-09-09 – 2011-09-10 (×3): via INTRAVENOUS
  Administered 2011-09-10: 1000 mL via INTRAVENOUS
  Administered 2011-09-11 – 2011-09-12 (×5): via INTRAVENOUS
  Administered 2011-09-12: 125 mL/h via INTRAVENOUS
  Administered 2011-09-13 (×2): via INTRAVENOUS

## 2011-09-04 MED ORDER — ONDANSETRON HCL 4 MG/2ML IJ SOLN
4.0000 mg | INTRAMUSCULAR | Status: DC | PRN
  Administered 2011-09-04 – 2011-09-14 (×58): 4 mg via INTRAVENOUS
  Filled 2011-09-04 (×59): qty 2

## 2011-09-04 MED ORDER — HYDROMORPHONE HCL PF 2 MG/ML IJ SOLN
2.0000 mg | Freq: Once | INTRAMUSCULAR | Status: AC
  Administered 2011-09-04: 2 mg via INTRAMUSCULAR
  Filled 2011-09-04: qty 1

## 2011-09-04 MED ORDER — ENOXAPARIN SODIUM 40 MG/0.4ML ~~LOC~~ SOLN
40.0000 mg | SUBCUTANEOUS | Status: DC
  Administered 2011-09-04 – 2011-09-13 (×10): 40 mg via SUBCUTANEOUS
  Filled 2011-09-04 (×11): qty 0.4

## 2011-09-04 MED ORDER — HYDROMORPHONE HCL PF 2 MG/ML IJ SOLN
1.0000 mg | INTRAMUSCULAR | Status: DC | PRN
  Administered 2011-09-04 – 2011-09-06 (×17): 2 mg via INTRAVENOUS
  Administered 2011-09-06 (×3): 1 mg via INTRAVENOUS
  Administered 2011-09-06 (×7): 2 mg via INTRAVENOUS
  Administered 2011-09-07 (×4): 1 mg via INTRAVENOUS
  Administered 2011-09-07 (×5): 2 mg via INTRAVENOUS
  Filled 2011-09-04 (×36): qty 1

## 2011-09-04 MED ORDER — POTASSIUM CHLORIDE CRYS ER 10 MEQ PO TBCR
10.0000 meq | EXTENDED_RELEASE_TABLET | Freq: Two times a day (BID) | ORAL | Status: DC
  Administered 2011-09-05 – 2011-09-14 (×19): 10 meq via ORAL
  Filled 2011-09-04 (×21): qty 1

## 2011-09-04 MED ORDER — FOLIC ACID 1 MG PO TABS
1.0000 mg | ORAL_TABLET | Freq: Every day | ORAL | Status: DC
  Administered 2011-09-04: 1 mg via ORAL
  Filled 2011-09-04: qty 1

## 2011-09-04 MED ORDER — FOLIC ACID 1 MG PO TABS
1.0000 mg | ORAL_TABLET | Freq: Every day | ORAL | Status: DC
  Administered 2011-09-05 – 2011-09-14 (×10): 1 mg via ORAL
  Filled 2011-09-04 (×10): qty 1

## 2011-09-04 MED ORDER — ONDANSETRON HCL 4 MG PO TABS: 4.0000 mg | ORAL_TABLET | ORAL | Status: DC | PRN

## 2011-09-04 MED ORDER — POTASSIUM CHLORIDE 10 MEQ/100ML IV SOLN: 10.0000 meq | INTRAVENOUS | Status: DC

## 2011-09-04 MED ORDER — PANTOPRAZOLE SODIUM 40 MG PO TBEC
40.0000 mg | DELAYED_RELEASE_TABLET | Freq: Two times a day (BID) | ORAL | Status: DC
  Administered 2011-09-05 – 2011-09-14 (×19): 40 mg via ORAL
  Filled 2011-09-04 (×25): qty 1

## 2011-09-04 MED ORDER — DEFEROXAMINE MESYLATE 2 G IJ SOLR
2000.0000 mg | Freq: Every day | INTRAMUSCULAR | Status: DC
  Administered 2011-09-04 – 2011-09-13 (×10): 2000 mg via INTRAVENOUS
  Filled 2011-09-04 (×11): qty 2

## 2011-09-04 MED ORDER — POTASSIUM CHLORIDE 10 MEQ/100ML IV SOLN
10.0000 meq | INTRAVENOUS | Status: AC
  Administered 2011-09-04 (×2): 10 meq via INTRAVENOUS
  Filled 2011-09-04 (×2): qty 100

## 2011-09-04 MED ORDER — IBUPROFEN 800 MG PO TABS: 800.0000 mg | ORAL_TABLET | Freq: Three times a day (TID) | ORAL | Status: DC | PRN

## 2011-09-04 MED ORDER — POTASSIUM CHLORIDE CRYS ER 20 MEQ PO TBCR
40.0000 meq | EXTENDED_RELEASE_TABLET | Freq: Once | ORAL | Status: AC
  Administered 2011-09-04: 40 meq via ORAL
  Filled 2011-09-04: qty 2

## 2011-09-04 MED ORDER — DIPHENHYDRAMINE HCL 25 MG PO CAPS: 25.0000 mg | ORAL_CAPSULE | ORAL | Status: DC | PRN

## 2011-09-04 NOTE — Progress Notes (Signed)
Report called to Cyndy, RN on 3 east. Pt is to be admitted to floor for IV hydration and  IV treatment for  Low K+ levels. Pt escorted to floor via wheelchair by associate.

## 2011-09-04 NOTE — ED Provider Notes (Signed)
History     CSN: [id number]  Arrival date & time 09/04/11  7846   First MD Initiated Contact with Patient 09/04/11 1016      Chief Complaint  Patient presents with  . Sickle Cell Pain Crisis    (Consider location/radiation/quality/duration/timing/severity/associated sxs/prior treatment) HPI Comments: [name] is a 31 y.o. Male hx of sickle cell, PE and DVTs here with R calf pain. R calf pain x 1 week. He took motrin, oxycontin, not improving. No CP or SOB, no fevers. Its worse than his usual sickle pain. No recent travels no leg swelling.     The history is provided by the patient.    Past Medical History  Diagnosis Date  . Sickle cell anemia   . Blood transfusion   . 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 Hypokalemia   . Mood disorder   . Pulmonary embolism   . Avascular necrosis   . Leukocytosis     Chronic  . Thrombocytosis     Chronic    Past Surgical History  Procedure Date  . Right hip replacement     08/2006  . Cholecystectomy     01/2008  . Porta cath placement   . Porta cath removal   . Umbilical hernia repair     01/2008  . Excision of left periauricular cyst     10/2009  . Excision of right ear lobe cyst with primary closur     11/2007    Family History  Problem Relation Age of Onset  . Sickle cell anemia Mother   . Sickle cell anemia Father   . Sickle cell trait Brother     History  Substance Use Topics  . Smoking status: Former Smoker -- 13 years    Types: Cigarettes    Quit date: 07/08/2010  . Smokeless tobacco: Never Used  . Alcohol Use: No      Review of Systems  Musculoskeletal:       Calf pain  All other systems reviewed and are negative.    Allergies  Morphine and related  Home Medications   Current Outpatient Rx  Name Route Sig Dispense Refill  . FOLIC ACID 1 MG PO TABS Oral Take 1 mg by mouth daily.    Marland Kitchen HYDROMORPHONE HCL 4 MG PO TABS Oral Take 4 mg by mouth every 4 (four) hours  as needed. For pain.    Marland Kitchen HYDROXYUREA 500 MG PO CAPS Oral Take 500 mg by mouth 2 (two) times daily. May take with food to minimize GI side effects.    . IBUPROFEN 800 MG PO TABS Oral Take 800 mg by mouth every 8 (eight) hours as needed. For pain.    . OXYCODONE HCL ER 80 MG PO TB12 Oral Take 80 mg by mouth every 12 (twelve) hours.    Marland Kitchen PANTOPRAZOLE SODIUM 40 MG PO TBEC Oral Take 40 mg by mouth 2 (two) times daily before a meal.    . POTASSIUM CHLORIDE CRYS ER 20 MEQ PO TBCR Oral Take 1 tablet (20 mEq total) by mouth daily. 30 tablet 1  . ZOLPIDEM TARTRATE 10 MG PO TABS Oral Take 10 mg by mouth at bedtime as needed. For sleep.      BP 117/70  Pulse 73  Temp 98.4 F (36.9 C) (Oral)  Resp 16  SpO2 98%  Physical Exam  Constitutional: He is oriented to person, place, and time.       Slightly uncomfortable.  HENT:  Head: Normocephalic and atraumatic.  Eyes: Conjunctivae and EOM are normal. Pupils are equal, round, and reactive to light.  Neck: Normal range of motion.  Cardiovascular: Normal rate and regular rhythm.   Pulmonary/Chest: Effort normal and breath sounds normal.  Abdominal: Soft.  Musculoskeletal:       Mild R calf tenderness no edema.   Neurological: He is alert and oriented to person, place, and time.  Skin: Skin is warm.  Psychiatric: He has a normal mood and affect. His behavior is normal. Judgment and thought content normal.    ED Course  Procedures (including critical care time)  Labs Reviewed  CBC WITH DIFFERENTIAL - Abnormal; Notable for the following:    WBC 21.8 (*)     RBC 3.04 (*)     Hemoglobin 9.2 (*)     HCT 27.4 (*)     RDW 19.7 (*)     Platelets 558 (*)     Neutro Abs 15.5 (*)     Monocytes Absolute 2.4 (*)     Basophils Absolute 0.2 (*)     All other components within normal limits  COMPREHENSIVE METABOLIC PANEL - Abnormal; Notable for the following:    Potassium 3.0 (*)     BUN 5 (*)     Creatinine, Ser 0.45 (*)     Alkaline Phosphatase 131  (*)     Total Bilirubin 3.2 (*)     All other components within normal limits  RETICULOCYTES - Abnormal; Notable for the following:    Retic Ct Pct 13.8 (*)     RBC. 3.04 (*)     Retic Count, Manual 419.5 (*)     All other components within normal limits   Dg Chest 2 View  09/04/2011  *RADIOLOGY REPORT*  Clinical Data: Sickle cell crisis.  Chest pain.  CHEST - 2 VIEW  Comparison: Chest x-ray 08/21/2011.  Findings: Lung volumes are normal.  There is mild diffuse coarsening of the interstitium, similar to prior examinations (this appears be chronic in this patient).  No definite acute consolidative airspace disease.  No pleural effusions.  No evidence of pulmonary edema.  Heart size is borderline enlarged. Mediastinal contours are unremarkable.  Surgical clips project over the right upper quadrant of the abdomen, consistent with prior cholecystectomy.  IMPRESSION: 1.  No definite radiographic evidence of acute cardiopulmonary disease.  Original Report Authenticated By: Florencia Reasons, M.D.     1. Sickle cell crisis       MDM  [name] is a 31 y.o. male hx of DVT/PE, sickle cell here with R calf pain and tenderness. Will get US duplex R calf to r/o dvt. If negative can send to sickle clinic for pain control.   3:34 PM Patient doesn't have DVT on R calf. Discussed with sickle cell nurse, Elon Jester, who requested labs and cxr on the patient.   3:34 PM Labs around baseline, cxr nl. Discussed with Marcelino Duster, who accepted the patient to the sickle cell clinic. Patient still has some pain after 4 mg dilaudid IM.        Richardean Canal, MD 09/04/11 1535

## 2011-09-04 NOTE — ED Notes (Signed)
Patient is alert and oriented x3.  He is complaining of sickle cell crisis pain. He states his pain is in his right leg.  Currently he rates his pain at a 8 of 10.

## 2011-09-04 NOTE — Progress Notes (Signed)
VASCULAR LAB PRELIMINARY  PRELIMINARY  PRELIMINARY  PRELIMINARY  Right lower extremity venous Doppler completed.    Preliminary report:  There is no DVT or SVT noted in the right lower extremity. Enlarged lymph node noted in the right groin.  [name], 09/04/2011, 12:04 PM

## 2011-09-04 NOTE — H&P (Signed)
[facility] History and Physical   Date: 09/04/2011  Patient name: [name] Medical record number: [id number] Date of birth: 1980-11-11 Age: 31 y.o. Gender: male PCP: [name], MD  Attending physician: Gwenyth Bender, MD  Chief Complaint: Muscles cramps in legs esp right for 1 week    History of Present Illness:  [name] is a 31 y.o. African American male who was admitted to the sickle cell center for observation on with acute hemolytic/painful sickle cell crisis and hypokalemia. His main complaint was pain in legs that where continuing to ache this was not relieved by his home pain meds. Rates pain 9/10. Due to the time restraints in tx it was determine to admit to in patient for aggressive treatment.     Meds: Prescriptions prior to admission  Medication Sig Dispense Refill  . folic acid (FOLVITE) 1 MG tablet Take 1 mg by mouth daily.      . hydroxyurea (HYDREA) 500 MG capsule Take 500 mg by mouth 2 (two) times daily. May take with food to minimize GI side effects.      Marland Kitchen ibuprofen (ADVIL,MOTRIN) 800 MG tablet Take 800 mg by mouth every 8 (eight) hours as needed. For pain.      Marland Kitchen oxyCODONE (OXYCONTIN) 80 MG 12 hr tablet Take 80 mg by mouth every 12 (twelve) hours.      . pantoprazole (PROTONIX) 40 MG tablet Take 40 mg by mouth 2 (two) times daily before a meal.       . potassium chloride SA (K-DUR,KLOR-CON) 20 MEQ tablet Take 1 tablet (20 mEq total) by mouth daily.  30 tablet  1  . HYDROmorphone (DILAUDID) 4 MG tablet Take 4 mg by mouth every 4 (four) hours as needed. For pain.      Marland Kitchen zolpidem (AMBIEN) 10 MG tablet Take 10 mg by mouth at bedtime as needed. For sleep.         Allergies: Morphine and related Past Medical History  Diagnosis Date  . Sickle cell anemia   . Blood transfusion   . 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 Hypokalemia   . Mood disorder   . Pulmonary embolism   . Avascular necrosis   .  Leukocytosis     Chronic  . Thrombocytosis     Chronic   Past Surgical History  Procedure Date  . Right hip replacement     08/2006  . Cholecystectomy     01/2008  . Porta cath placement   . Porta cath removal   . Umbilical hernia repair     01/2008  . Excision of left periauricular cyst     10/2009  . Excision of right ear lobe cyst with primary closur     11/2007   Family History  Problem Relation Age of Onset  . Sickle cell anemia Mother   . Sickle cell anemia Father   . Sickle cell trait Brother    History   Social History  . Marital Status: Single    Spouse Name: N/A    Number of Children: 0  . Years of Education: 13   Occupational History  . Unemployed    Social History Main Topics  . Smoking status: Former Smoker -- 13 years    Types: Cigarettes    Quit date: 07/08/2010  . Smokeless tobacco: Never Used  . Alcohol Use: No  . Drug Use: No     quit smoking  2011  . Sexually Active: Yes    Birth Control/ Protection: None   Other Topics Concern  . Not on file   Social History Narrative   Single.  Lives with girlfriend.      Review of Systems: Pertinent items are noted in HPI.  Physical Exam: Blood pressure 127/78, pulse 79, temperature 98.8 F (37.1 C), temperature source Oral, resp. rate 16, SpO2 98.00%. General Appearance: Alert and oriented, cooperative, well developed, well nourished, mild distress  Head: Normocephalic, without obvious abnormality, atraumatic  Eyes: PERRLA, EOMI, the patient has slight strabismus, scleral icterus  Nose: Nares normal, septum midline, mucosa normal, no drainage or sinus tenderness.  Throat: Normal lips, mucosa, tongue and gums  Back: Symmetric, bilateral CVA/flank tenderness, lower back tenderness  Resp: CTA, no wheezes/rales/rhonchi  Cardio: S1, S2 regular, no murmurs/clicks/rubs/gallops  GI: Soft, non-tender, hypoactive bowel sounds, no masses, no organomegaly  Male Genitalia: Deferred, the patient denies  priapism  Extremities: Extremities normal, atraumatic, no cyanosis or edema, negative Homans sign, no sign of DVT, no tenderness  Pulses: 2+ and symmetric  Skin: Skin color, texture, turgor normal, no rashes or lesions, numerous tattoos  Neurologic: Grossly normal, CN II-XII intact, no focal deficits  Psych: Appropriate affect   Lab results: Results for orders placed during the hospital encounter of 09/04/11 (from the past 24 hour(s))  CBC WITH DIFFERENTIAL     Status: Abnormal   Collection Time   09/04/11  2:24 PM      Component Value Range   WBC 21.8 (*) 4.0 - 10.5 K/uL   RBC 3.04 (*) 4.22 - 5.81 MIL/uL   Hemoglobin 9.2 (*) 13.0 - 17.0 g/dL   HCT 16.1 (*) 09.6 - 04.5 %   MCV 90.1  78.0 - 100.0 fL   MCH 30.3  26.0 - 34.0 pg   MCHC 33.6  30.0 - 36.0 g/dL   RDW 40.9 (*) 81.1 - 91.4 %   Platelets 558 (*) 150 - 400 K/uL   Neutrophils Relative 71  43 - 77 %   Lymphocytes Relative 15  12 - 46 %   Monocytes Relative 11  3 - 12 %   Eosinophils Relative 2  0 - 5 %   Basophils Relative 1  0 - 1 %   Neutro Abs 15.5 (*) 1.7 - 7.7 K/uL   Lymphs Abs 3.3  0.7 - 4.0 K/uL   Monocytes Absolute 2.4 (*) 0.1 - 1.0 K/uL   Eosinophils Absolute 0.4  0.0 - 0.7 K/uL   Basophils Absolute 0.2 (*) 0.0 - 0.1 K/uL   RBC Morphology TARGET CELLS    COMPREHENSIVE METABOLIC PANEL     Status: Abnormal   Collection Time   09/04/11  2:24 PM      Component Value Range   Sodium 137  135 - 145 mEq/L   Potassium 3.0 (*) 3.5 - 5.1 mEq/L   Chloride 103  96 - 112 mEq/L   CO2 21  19 - 32 mEq/L   Glucose, Bld 93  70 - 99 mg/dL   BUN 5 (*) 6 - 23 mg/dL   Creatinine, Ser 7.82 (*) 0.50 - 1.35 mg/dL   Calcium 9.4  8.4 - 95.6 mg/dL   Total Protein 8.2  6.0 - 8.3 g/dL   Albumin 4.1  3.5 - 5.2 g/dL   AST 32  0 - 37 U/L   ALT 25  0 - 53 U/L   Alkaline Phosphatase 131 (*) 39 - 117 U/L   Total  Bilirubin 3.2 (*) 0.3 - 1.2 mg/dL   GFR calc non Af Amer >90  >90 mL/min   GFR calc Af Amer >90  >90 mL/min  RETICULOCYTES      Status: Abnormal   Collection Time   09/04/11  2:24 PM      Component Value Range   Retic Ct Pct 13.8 (*) 0.4 - 3.1 %   RBC. 3.04 (*) 4.22 - 5.81 MIL/uL   Retic Count, Manual 419.5 (*) 19.0 - 186.0 K/uL    Imaging results:  Dg Chest 2 View  09/04/2011  *RADIOLOGY REPORT*  Clinical Data: Sickle cell crisis.  Chest pain.  CHEST - 2 VIEW  Comparison: Chest x-ray 08/21/2011.  Findings: Lung volumes are normal.  There is mild diffuse coarsening of the interstitium, similar to prior examinations (this appears be chronic in this patient).  No definite acute consolidative airspace disease.  No pleural effusions.  No evidence of pulmonary edema.  Heart size is borderline enlarged. Mediastinal contours are unremarkable.  Surgical clips project over the right upper quadrant of the abdomen, consistent with prior cholecystectomy.  IMPRESSION: 1.  No definite radiographic evidence of acute cardiopulmonary disease.  Original Report Authenticated By: Florencia Reasons, M.D.     Assessment & Plan: Patient Active Hospital Problem List:  Sickle cell crisis :with active hemolysis will tx aggressively with IVF, pain, pruritus , bowel and antimeric management (DVT prophylaxis ) Hypokalemia: potassium replacement  Hemochromatosis(hx) start Desferal   Asani Deniston P 09/04/2011, 6:09 PM

## 2011-09-05 DIAGNOSIS — G4700 Insomnia, unspecified: Secondary | ICD-10-CM | POA: Diagnosis not present

## 2011-09-05 DIAGNOSIS — I2782 Chronic pulmonary embolism: Secondary | ICD-10-CM | POA: Diagnosis not present

## 2011-09-05 DIAGNOSIS — D5700 Hb-SS disease with crisis, unspecified: Secondary | ICD-10-CM | POA: Diagnosis not present

## 2011-09-05 DIAGNOSIS — D571 Sickle-cell disease without crisis: Secondary | ICD-10-CM | POA: Diagnosis not present

## 2011-09-05 DIAGNOSIS — M87029 Idiopathic aseptic necrosis of unspecified humerus: Secondary | ICD-10-CM | POA: Diagnosis not present

## 2011-09-05 DIAGNOSIS — M729 Fibroblastic disorder, unspecified: Secondary | ICD-10-CM | POA: Diagnosis not present

## 2011-09-05 DIAGNOSIS — E876 Hypokalemia: Secondary | ICD-10-CM | POA: Diagnosis not present

## 2011-09-05 DIAGNOSIS — Z96649 Presence of unspecified artificial hip joint: Secondary | ICD-10-CM | POA: Diagnosis not present

## 2011-09-05 LAB — CBC WITH DIFFERENTIAL/PLATELET
Eosinophils Relative: 4 % (ref 0–5)
HCT: 27 % — ABNORMAL LOW (ref 39.0–52.0)
Lymphs Abs: 5 10*3/uL — ABNORMAL HIGH (ref 0.7–4.0)
MCH: 30.8 pg (ref 26.0–34.0)
MCV: 90.3 fL (ref 78.0–100.0)
Monocytes Absolute: 3.2 10*3/uL — ABNORMAL HIGH (ref 0.1–1.0)
Monocytes Relative: 16 % — ABNORMAL HIGH (ref 3–12)
Neutro Abs: 10.8 10*3/uL — ABNORMAL HIGH (ref 1.7–7.7)
RBC: 2.99 MIL/uL — ABNORMAL LOW (ref 4.22–5.81)
RDW: 19.8 % — ABNORMAL HIGH (ref 11.5–15.5)
WBC: 20 10*3/uL — ABNORMAL HIGH (ref 4.0–10.5)

## 2011-09-05 LAB — COMPREHENSIVE METABOLIC PANEL
ALT: 24 U/L (ref 0–53)
Alkaline Phosphatase: 125 U/L — ABNORMAL HIGH (ref 39–117)
BUN: 4 mg/dL — ABNORMAL LOW (ref 6–23)
CO2: 22 mEq/L (ref 19–32)
GFR calc Af Amer: >90 mL/min (ref >90)
GFR calc non Af Amer: >90 mL/min (ref >90)
Glucose, Bld: 113 mg/dL — ABNORMAL HIGH (ref 70–99)
Potassium: 3.6 mEq/L (ref 3.5–5.1)
Sodium: 132 mEq/L — ABNORMAL LOW (ref 135–145)
Total Bilirubin: 3.6 mg/dL — ABNORMAL HIGH (ref 0.3–1.2)

## 2011-09-05 LAB — MRSA PCR SCREENING: MRSA by PCR: NEGATIVE

## 2011-09-05 NOTE — Progress Notes (Signed)
UR done. 

## 2011-09-05 NOTE — Progress Notes (Signed)
Subjective:   [name]  was seen on rounds today.  He was sitting up in bed watching TV in no acute distress. He complains of pain in  leg pains grade 6/10. (ESP  Right calf)  Denies any cough fever chills denies any urinary complaints or priapism.  Objective:  Vital Signs in the last 24 hours: Temp:  [97.8 F (36.6 C)-99 F (37.2 C)] 98.1 F (36.7 C) (08/05 1321) Pulse Rate:  [75-86] 80  (08/05 1321) Resp:  [16-18] 18  (08/05 1321) BP: (111-127)/(73-79) 118/74 mmHg (08/05 1321) SpO2:  [96 %-98 %] 97 % (08/05 1321) Weight:  [85.2 kg (187 lb 13.3 oz)-86.9 kg (191 lb 9.3 oz)] 86.9 kg (191 lb 9.3 oz) (08/05 0602)  Intake/Output from previous day: 08/04 0701 - 08/05 0700 In: 480 [P.O.:480] Out: -  Intake/Output from this shift: Total I/O In: 2081 [P.O.:480; I.V.:1601] Out: -   Physical Exam: General Appearance: Alert and oriented, cooperative, well developed, well nourished, mild distress  Head: Normocephalic, atraumatic  Eyes: PERRLA, EOMI, the patient has slight strabismus, scleral icterus  Nose: Nares normal, septum midline, mucosa normal, no drainage or sinus tenderness.  Throat: Normal lips, mucosa, tongue and gums  Back: Symmetric, lower back tenderness  Resp: CTA, no wheezes/rales/rhonchi  Cardio: S1, S2 regular, no murmurs/clicks/rubs/gallops  GI: Soft, non-tender, normoactive bowel sounds, no masses, no organomegaly  Male Genitalia: Deferred, the patient denies priapism  Extremities: Extremities normal, atraumatic, no cyanosis or edema, negative Homans sign, no sign of DVT, LE tenderness  Pulses: 2+ and symmetric  Skin: Skin color, texture, turgor normal, no rashes or lesions, numerous tattoos  Neurologic: Grossly normal, CN II-XII intact, no focal deficits  Psych: Appropriate affect  Lab Results:  Basename 09/05/11 0730 09/04/11 1424  WBC 20.0* 21.8*  HGB 9.2* 9.2*  PLT 533* 558*    Basename 09/05/11 0730 09/04/11 1424  NA 132* 137  K 3.6 3.0*  CL 99 103    CO2 22 21  GLUCOSE 113* 93  BUN 4* 5*  CREATININE 0.55 0.45*   No results found for this basename: TROPONINI:2,CK,MB:2 in the last 72 hours Hepatic Function Panel  Basename 09/05/11 0730  PROT 7.7  ALBUMIN 3.9  AST 35  ALT 24  ALKPHOS 125*  BILITOT 3.6*  BILIDIR --  IBILI --   No results found for this basename: CHOL in the last 72 hours No results found for this basename: PROTIME in the last 72 hours  Imaging: Imaging results have been reviewed and Dg Chest 2 View  09/04/2011  *RADIOLOGY REPORT*  Clinical Data: Sickle cell crisis.  Chest pain.  CHEST - 2 VIEW  Comparison: Chest x-ray 08/21/2011.  Findings: Lung volumes are normal.  There is mild diffuse coarsening of the interstitium, similar to prior examinations (this appears be chronic in this patient).  No definite acute consolidative airspace disease.  No pleural effusions.  No evidence of pulmonary edema.  Heart size is borderline enlarged. Mediastinal contours are unremarkable.  Surgical clips project over the right upper quadrant of the abdomen, consistent with prior cholecystectomy.  IMPRESSION: 1.  No definite radiographic evidence of acute cardiopulmonary disease.  Original Report Authenticated By: Florencia Reasons, M.D.    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 pain management IVF, bowel and pruritis management   Marked leukocytosis Hypokalemia:  resolve restarted po K+ supplement  History of PE:  DVT prophylaxis ( doppler negative)  Avascular necrosis of shoulders: stable  LOS: 1 day    [name], [name] 09/05/2011, 2:36 PM

## 2011-09-06 DIAGNOSIS — M87029 Idiopathic aseptic necrosis of unspecified humerus: Secondary | ICD-10-CM | POA: Diagnosis not present

## 2011-09-06 DIAGNOSIS — D571 Sickle-cell disease without crisis: Secondary | ICD-10-CM | POA: Diagnosis not present

## 2011-09-06 DIAGNOSIS — Z96649 Presence of unspecified artificial hip joint: Secondary | ICD-10-CM | POA: Diagnosis not present

## 2011-09-06 DIAGNOSIS — M729 Fibroblastic disorder, unspecified: Secondary | ICD-10-CM | POA: Diagnosis not present

## 2011-09-06 DIAGNOSIS — G4700 Insomnia, unspecified: Secondary | ICD-10-CM | POA: Diagnosis not present

## 2011-09-06 DIAGNOSIS — I2782 Chronic pulmonary embolism: Secondary | ICD-10-CM | POA: Diagnosis not present

## 2011-09-06 DIAGNOSIS — E876 Hypokalemia: Secondary | ICD-10-CM | POA: Diagnosis not present

## 2011-09-06 DIAGNOSIS — D5700 Hb-SS disease with crisis, unspecified: Secondary | ICD-10-CM | POA: Diagnosis not present

## 2011-09-06 LAB — CBC WITH DIFFERENTIAL/PLATELET
HCT: 24.2 % — ABNORMAL LOW (ref 39.0–52.0)
Hemoglobin: 8.4 g/dL — ABNORMAL LOW (ref 13.0–17.0)
Lymphocytes Relative: 26 % (ref 12–46)
Lymphs Abs: 4.2 10*3/uL — ABNORMAL HIGH (ref 0.7–4.0)
Monocytes Absolute: 2.7 10*3/uL — ABNORMAL HIGH (ref 0.1–1.0)
Monocytes Relative: 17 % — ABNORMAL HIGH (ref 3–12)
Neutro Abs: 8.4 10*3/uL — ABNORMAL HIGH (ref 1.7–7.7)
Neutrophils Relative %: 51 % (ref 43–77)
RBC: 2.71 MIL/uL — ABNORMAL LOW (ref 4.22–5.81)

## 2011-09-06 LAB — COMPREHENSIVE METABOLIC PANEL
Albumin: 3.5 g/dL (ref 3.5–5.2)
Alkaline Phosphatase: 116 U/L (ref 39–117)
BUN: 5 mg/dL — ABNORMAL LOW (ref 6–23)
CO2: 20 mEq/L (ref 19–32)
Chloride: 104 mEq/L (ref 96–112)
Creatinine, Ser: 0.6 mg/dL (ref 0.50–1.35)
GFR calc Af Amer: >90 mL/min (ref >90)
GFR calc non Af Amer: >90 mL/min (ref >90)
Glucose, Bld: 111 mg/dL — ABNORMAL HIGH (ref 70–99)
Potassium: 3.7 mEq/L (ref 3.5–5.1)
Total Bilirubin: 4.3 mg/dL — ABNORMAL HIGH (ref 0.3–1.2)

## 2011-09-06 LAB — FERRITIN: Ferritin: 2055 ng/mL — ABNORMAL HIGH (ref 22–322)

## 2011-09-06 MED ORDER — DIPHENHYDRAMINE HCL 50 MG/ML IJ SOLN
25.0000 mg | Freq: Once | INTRAMUSCULAR | Status: AC
  Administered 2011-09-07: 25 mg via INTRAVENOUS
  Filled 2011-09-06: qty 1

## 2011-09-06 MED ORDER — ACETAMINOPHEN 325 MG PO TABS
650.0000 mg | ORAL_TABLET | Freq: Once | ORAL | Status: AC
  Administered 2011-09-07: 650 mg via ORAL

## 2011-09-06 NOTE — Progress Notes (Signed)
Subjective:   Mr. Tassin was seen on rounds today.  He  Has been up out of bed ambulating in the halls without difficulty. He is currently lying in bed watching TV  Without distress. Appetite good and having good BM's. Some intermittent dull pain right rib rated 4/10 (since adm, but not voiced until today) and ongoing pain in right lower leg (esp the calf area) now rated 5-6/10. He continues to require prn pain med every two hours with relief. He denies cough, fever, chills, urinary complaints or priapism, but has complaints insomnia.  Objective:  Vital Signs in the last 24 hours: Temp:  [97.6 F (36.4 C)-98.7 F (37.1 C)] 98 F (36.7 C) (08/06 1537) Pulse Rate:  [78-88] 88  (08/06 1537) Resp:  [16-18] 18  (08/06 1537) BP: (120-130)/(76-89) 130/80 mmHg (08/06 1537) SpO2:  [96 %-100 %] 98 % (08/06 1537) Weight:  [88.2 kg (194 lb 7.1 oz)] 88.2 kg (194 lb 7.1 oz) (08/06 0600)  Intake/Output from previous day: 08/05 0701 - 08/06 0700 In: 4858 [P.O.:1310; I.V.:3041; IV Piggyback:507] Out: -  Intake/Output from this shift: Total I/O In: 1322.9 [P.O.:350; I.V.:972.9] Out: -   Physical Exam: General Appearance: Alert and oriented, cooperative, well developed, well nourished, mild distress  Head: Normocephalic, atraumatic  Eyes: PERRLA, EOMI, the patient has slight strabismus, scleral icterus  Nose: Nares normal, septum midline, mucosa normal, no drainage or sinus tenderness.  Throat: Normal lips, mucosa, tongue and gums  Back: Symmetric, lower back tenderness  Resp: CTA, no wheezes/rales/rhonchi  Cardio: S1, S2 regular, no murmurs/clicks/rubs/gallops  GI: Soft, non-tender, normoactive bowel sounds, no masses, no organomegaly  Male Genitalia: Deferred, the patient denies priapism  Extremities: Extremities normal, atraumatic, no cyanosis or edema, negative Homans sign, no sign of DVT, LE tenderness Pulses: 2+ and symmetric  Skin: Skin color, texture, turgor normal, no rashes or lesions,  numerous tattoos  Neurologic: Grossly normal, CN II-XII intact, no focal deficits  Psych: Appropriate affect  Lab Results:  Basename 09/06/11 0646 09/05/11 0730  WBC 16.3* 20.0*  HGB 8.4* 9.2*  PLT 519* 533*    Basename 09/06/11 0646 09/05/11 0730  NA 134* 132*  K 3.7 3.6  CL 104 99  CO2 20 22  GLUCOSE 111* 113*  BUN 5* 4*  CREATININE 0.60 0.55   No results found for this basename: TROPONINI:2,CK,MB:2 in the last 72 hours Hepatic Function Panel  Basename 09/06/11 0646  PROT 7.1  ALBUMIN 3.5  AST 29  ALT 21  ALKPHOS 116  BILITOT 4.3*  BILIDIR --  IBILI --   No results found for this basename: CHOL in the last 72 hours No results found for this basename: PROTIME in the last 72 hours  Imaging: Imaging results have been reviewed and No results found.  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; T.Bili increasing (^4.3) with decreasing Hgb (9.2 to 8.4) will cont IVF, pain management,  and an exchange/transfusion (remove 300cc and give 1Unit PRBC's). Will also cont bowel and pruritis management   Marked leukocytosis- afebrile; trending down Hypokalemia:  Resolved with restarting po K+ supplement. Currently BID and will changed to daily dosing at time of discharge  History of PE:  DVT prophylaxis ( doppler negative); less pain; able to ambulate Avascular necrosis of shoulders: stable  Insomnia- takes Ambien prn at home and will use prn while here. Disposition- discharge within next 24-48 hours. Hopefully  LOS: 2 days    Beverley Allender P 09/06/2011, 4:53 PM

## 2011-09-07 ENCOUNTER — Inpatient Hospital Stay (HOSPITAL_COMMUNITY): Payer: Medicare Other

## 2011-09-07 DIAGNOSIS — D5700 Hb-SS disease with crisis, unspecified: Secondary | ICD-10-CM | POA: Diagnosis not present

## 2011-09-07 DIAGNOSIS — D571 Sickle-cell disease without crisis: Secondary | ICD-10-CM | POA: Diagnosis not present

## 2011-09-07 DIAGNOSIS — E876 Hypokalemia: Secondary | ICD-10-CM | POA: Diagnosis not present

## 2011-09-07 DIAGNOSIS — Z5189 Encounter for other specified aftercare: Secondary | ICD-10-CM | POA: Diagnosis not present

## 2011-09-07 DIAGNOSIS — M87029 Idiopathic aseptic necrosis of unspecified humerus: Secondary | ICD-10-CM | POA: Diagnosis not present

## 2011-09-07 DIAGNOSIS — Z96649 Presence of unspecified artificial hip joint: Secondary | ICD-10-CM | POA: Diagnosis not present

## 2011-09-07 DIAGNOSIS — M729 Fibroblastic disorder, unspecified: Secondary | ICD-10-CM | POA: Diagnosis not present

## 2011-09-07 DIAGNOSIS — G4700 Insomnia, unspecified: Secondary | ICD-10-CM | POA: Diagnosis not present

## 2011-09-07 DIAGNOSIS — I2782 Chronic pulmonary embolism: Secondary | ICD-10-CM | POA: Diagnosis not present

## 2011-09-07 LAB — COMPREHENSIVE METABOLIC PANEL
ALT: 24 U/L (ref 0–53)
AST: 32 U/L (ref 0–37)
Alkaline Phosphatase: 127 U/L — ABNORMAL HIGH (ref 39–117)
CO2: 21 mEq/L (ref 19–32)
Calcium: 9 mg/dL (ref 8.4–10.5)
Potassium: 3.8 mEq/L (ref 3.5–5.1)
Sodium: 132 mEq/L — ABNORMAL LOW (ref 135–145)
Total Protein: 7.9 g/dL (ref 6.0–8.3)

## 2011-09-07 LAB — CBC WITH DIFFERENTIAL/PLATELET
Basophils Absolute: 0.2 10*3/uL — ABNORMAL HIGH (ref 0.0–0.1)
Eosinophils Relative: 8 % — ABNORMAL HIGH (ref 0–5)
Lymphocytes Relative: 23 % (ref 12–46)
MCV: 88.5 fL (ref 78.0–100.0)
Neutrophils Relative %: 50 % (ref 43–77)
Platelets: 583 10*3/uL — ABNORMAL HIGH (ref 150–400)
RDW: 18.8 % — ABNORMAL HIGH (ref 11.5–15.5)
WBC: 15.7 10*3/uL — ABNORMAL HIGH (ref 4.0–10.5)

## 2011-09-07 MED ORDER — LACTULOSE 10 GM/15ML PO SOLN
20.0000 g | Freq: Two times a day (BID) | ORAL | Status: DC
  Administered 2011-09-07 – 2011-09-14 (×10): 20 g via ORAL
  Filled 2011-09-07 (×15): qty 30

## 2011-09-07 MED ORDER — HYDROMORPHONE HCL PF 2 MG/ML IJ SOLN
2.0000 mg | INTRAMUSCULAR | Status: DC | PRN
  Administered 2011-09-07 – 2011-09-08 (×8): 4 mg via INTRAVENOUS
  Administered 2011-09-08: 3 mg via INTRAVENOUS
  Administered 2011-09-08: 4 mg via INTRAVENOUS
  Administered 2011-09-08: 2 mg via INTRAVENOUS
  Administered 2011-09-08 – 2011-09-14 (×67): 4 mg via INTRAVENOUS
  Filled 2011-09-07 (×77): qty 2

## 2011-09-07 MED ORDER — IBUPROFEN 800 MG PO TABS
800.0000 mg | ORAL_TABLET | Freq: Three times a day (TID) | ORAL | Status: DC
  Administered 2011-09-07 – 2011-09-14 (×22): 800 mg via ORAL
  Filled 2011-09-07 (×28): qty 1

## 2011-09-07 NOTE — Procedures (Signed)
R DL PICC placement No complication No blood loss. See complete dictation in [facility] PACS.  

## 2011-09-07 NOTE — Progress Notes (Signed)
Subjective:   [name] was seen on rounds today.  He  continues to  ambulate in the halls without difficulty. However he has had little relief from current pain regimen and asks that  his Dilaudid dose be increased. Appetite good but now reports no BM for 2 days.  Some intermittent dull pain right rib rated 6/10  With brief episode of mid sternal chest pain x's 1 during the night  And currently pain free without SOB He denies cough, fever, chills, urinary complaints or priapism. Objective:  Vital Signs in the last 24 hours: Temp:  [98.1 F (36.7 C)-99.2 F (37.3 C)] 98.1 F (36.7 C) (08/07 1910) Pulse Rate:  [76-89] 89  (08/07 1910) Resp:  [16-20] 18  (08/07 1910) BP: (116-138)/(78-85) 133/82 mmHg (08/07 1910) SpO2:  [94 %-98 %] 97 % (08/07 1840) Weight:  [87.6 kg (193 lb 2 oz)] 87.6 kg (193 lb 2 oz) (08/07 1478)  Intake/Output from previous day: 08/06 0701 - 08/07 0700 In: 1912.9 [P.O.:940; I.V.:972.9] Out: -  Intake/Output from this shift:    Physical Exam: General Appearance: Alert and oriented, cooperative, well developed, well nourished, mild distress  Head: Normocephalic, atraumatic  Eyes: PERRLA, EOMI, the patient has slight strabismus, scleral icterus  Nose: Nares normal, septum midline, mucosa normal, no drainage or sinus tenderness.  Throat: Normal lips, mucosa, tongue and gums  Back: Symmetric, lower back tenderness  Resp: CTA, no wheezes/rales/rhonchi  Cardio: S1, S2 regular, no murmurs/clicks/rubs/gallops  GI: Soft, non-tender, normoactive bowel sounds, no masses, no organomegaly  Male Genitalia: Deferred, the patient denies priapism  Extremities: Extremities normal, atraumatic, no cyanosis or edema, negative Homans sign, no sign of DVT, LE tenderness Pulses: 2+ and symmetric  Skin: Skin color, texture, turgor normal, no rashes or lesions, numerous tattoos  Neurologic: Grossly normal, CN II-XII intact, no focal deficits  Psych: Appropriate affect  Lab  Results:  Basename 09/07/11 0755 09/06/11 0646  WBC 15.7* 16.3*  HGB 8.7* 8.4*  PLT 583* 519*    Basename 09/07/11 0755 09/06/11 0646  NA 132* 134*  K 3.8 3.7  CL 101 104  CO2 21 20  GLUCOSE 106* 111*  BUN 5* 5*  CREATININE 0.58 0.60   No results found for this basename: TROPONINI:2,CK,MB:2 in the last 72 hours Hepatic Function Panel  Basename 09/07/11 0755  PROT 7.9  ALBUMIN 4.0  AST 32  ALT 24  ALKPHOS 127*  BILITOT 4.8*  BILIDIR --  IBILI --   No results found for this basename: CHOL in the last 72 hours No results found for this basename: PROTIME in the last 72 hours  Imaging: Imaging results have been reviewed and Ir Fluoro Guide Cv Line Right  09/07/2011  * RADIOLOGY REPORT *  Clinical data: Sickle cell anemia, needs venous access for transfusion  PICC PLACEMENT WITH ULTRASOUND AND FLUOROSCOPY  Technique: After written informed consent was obtained, patient was placed in the supine position on angiographic table. Patency of the right brachial vein was confirmed with ultrasound with image documentation. An appropriate skin site was determined. Skin site was marked. Region was prepped using maximum barrier technique including cap and mask, sterile gown, sterile gloves, large sterile sheet, and Chlorhexidine   as cutaneous antisepsis.  The region was infiltrated locally with 1% lidocaine.   Under real-time ultrasound guidance, the right brachial vein was accessed with a 21 gauge micropuncture needle; the needle tip within the vein was confirmed with ultrasound image documentation.   Needle exchanged over a 018  guidewire for a peel-away sheath, through which a 5-French double- lumen power injectable PICC trimmed to 41cm was advanced, positioned with its tip near the cavoatrial junction.  Spot chest radiograph confirms appropriate catheter position.  Catheter was flushed per protocol and secured externally with 0-Prolene sutures. The patient tolerated procedure well, with no  immediate complication.  IMPRESSION: Technically successful five [place] double lumen power injectable PICC placement  Original Report Authenticated By: Osa Craver, M.D.   Ir US Guide Vasc Access Right  09/07/2011  * RADIOLOGY REPORT *  Clinical data: Sickle cell anemia, needs venous access for transfusion  PICC PLACEMENT WITH ULTRASOUND AND FLUOROSCOPY  Technique: After written informed consent was obtained, patient was placed in the supine position on angiographic table. Patency of the right brachial vein was confirmed with ultrasound with image documentation. An appropriate skin site was determined. Skin site was marked. Region was prepped using maximum barrier technique including cap and mask, sterile gown, sterile gloves, large sterile sheet, and Chlorhexidine   as cutaneous antisepsis.  The region was infiltrated locally with 1% lidocaine.   Under real-time ultrasound guidance, the right brachial vein was accessed with a 21 gauge micropuncture needle; the needle tip within the vein was confirmed with ultrasound image documentation.   Needle exchanged over a 018 guidewire for a peel-away sheath, through which a 5-French double- lumen power injectable PICC trimmed to 41cm was advanced, positioned with its tip near the cavoatrial junction.  Spot chest radiograph confirms appropriate catheter position.  Catheter was flushed per protocol and secured externally with 0-Prolene sutures. The patient tolerated procedure well, with no immediate complication.  IMPRESSION: Technically successful five [place] double lumen power injectable PICC placement  Original Report Authenticated By: Osa Craver, M.D.    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; T.Bili increasing (^4.8) with  Hgb currently  8.7 and 8.4 yesterday will cont IVF, pain management, with exchange/transfusion (remove 300cc and  1Unit PRBC's),  Pending secondary to IV assess, we will also   cont bowel and pruritis management   Marked leukocytosis- afebrile; ongoing  Hypokalemia:  Resolved History of PE:  DVT prophylaxis ( doppler negative); less pain; able to ambulate Avascular necrosis of shoulders: stable  Insomnia- improved with Ambien prn Constipation: suspect  secondary to narcotic use add lactulose 30cc bid  Hemochromatosis : on celation therapy  since 09/04/11 with Ferritin 2055 on 09/06/11 will need  On going tx with Ex jade at time of d/c  Disposition- plans unclear at this time due increase pain and evidence of increased hemolysis   LOS: 3 days    [name], [name] 09/07/2011, 8:04 PM

## 2011-09-08 DIAGNOSIS — M729 Fibroblastic disorder, unspecified: Secondary | ICD-10-CM | POA: Diagnosis not present

## 2011-09-08 DIAGNOSIS — D571 Sickle-cell disease without crisis: Secondary | ICD-10-CM | POA: Diagnosis not present

## 2011-09-08 DIAGNOSIS — Z96649 Presence of unspecified artificial hip joint: Secondary | ICD-10-CM | POA: Diagnosis not present

## 2011-09-08 DIAGNOSIS — G4700 Insomnia, unspecified: Secondary | ICD-10-CM | POA: Diagnosis not present

## 2011-09-08 DIAGNOSIS — D5700 Hb-SS disease with crisis, unspecified: Secondary | ICD-10-CM | POA: Diagnosis not present

## 2011-09-08 DIAGNOSIS — E876 Hypokalemia: Secondary | ICD-10-CM | POA: Diagnosis not present

## 2011-09-08 DIAGNOSIS — M87029 Idiopathic aseptic necrosis of unspecified humerus: Secondary | ICD-10-CM | POA: Diagnosis not present

## 2011-09-08 DIAGNOSIS — I2782 Chronic pulmonary embolism: Secondary | ICD-10-CM | POA: Diagnosis not present

## 2011-09-08 LAB — CBC WITH DIFFERENTIAL/PLATELET
Basophils Absolute: 0.3 10*3/uL — ABNORMAL HIGH (ref 0.0–0.1)
Eosinophils Relative: 8 % — ABNORMAL HIGH (ref 0–5)
Lymphs Abs: 3.7 10*3/uL (ref 0.7–4.0)
MCH: 30.5 pg (ref 26.0–34.0)
Monocytes Absolute: 2.8 10*3/uL — ABNORMAL HIGH (ref 0.1–1.0)
Neutrophils Relative %: 50 % (ref 43–77)
Platelets: 514 10*3/uL — ABNORMAL HIGH (ref 150–400)
RBC: 2.69 MIL/uL — ABNORMAL LOW (ref 4.22–5.81)
RDW: 18.2 % — ABNORMAL HIGH (ref 11.5–15.5)
WBC: 16.3 10*3/uL — ABNORMAL HIGH (ref 4.0–10.5)

## 2011-09-08 LAB — COMPREHENSIVE METABOLIC PANEL
Albumin: 3.8 g/dL (ref 3.5–5.2)
Alkaline Phosphatase: 132 U/L — ABNORMAL HIGH (ref 39–117)
BUN: 6 mg/dL (ref 6–23)
Calcium: 8.6 mg/dL (ref 8.4–10.5)
Creatinine, Ser: 0.59 mg/dL (ref 0.50–1.35)
GFR calc Af Amer: >90 mL/min (ref >90)
Glucose, Bld: 93 mg/dL (ref 70–99)
Potassium: 3.6 mEq/L (ref 3.5–5.1)
Total Protein: 7.3 g/dL (ref 6.0–8.3)

## 2011-09-08 LAB — PREPARE RBC (CROSSMATCH)

## 2011-09-08 IMAGING — CR DG CHEST 2V
2 series · 2 of 2 positions shown · non-contrast
Comparison: 10/11/2008

CLINICAL DATA: Chest pain.  Fever.

CHEST - 2 VIEW

[w chest pa]
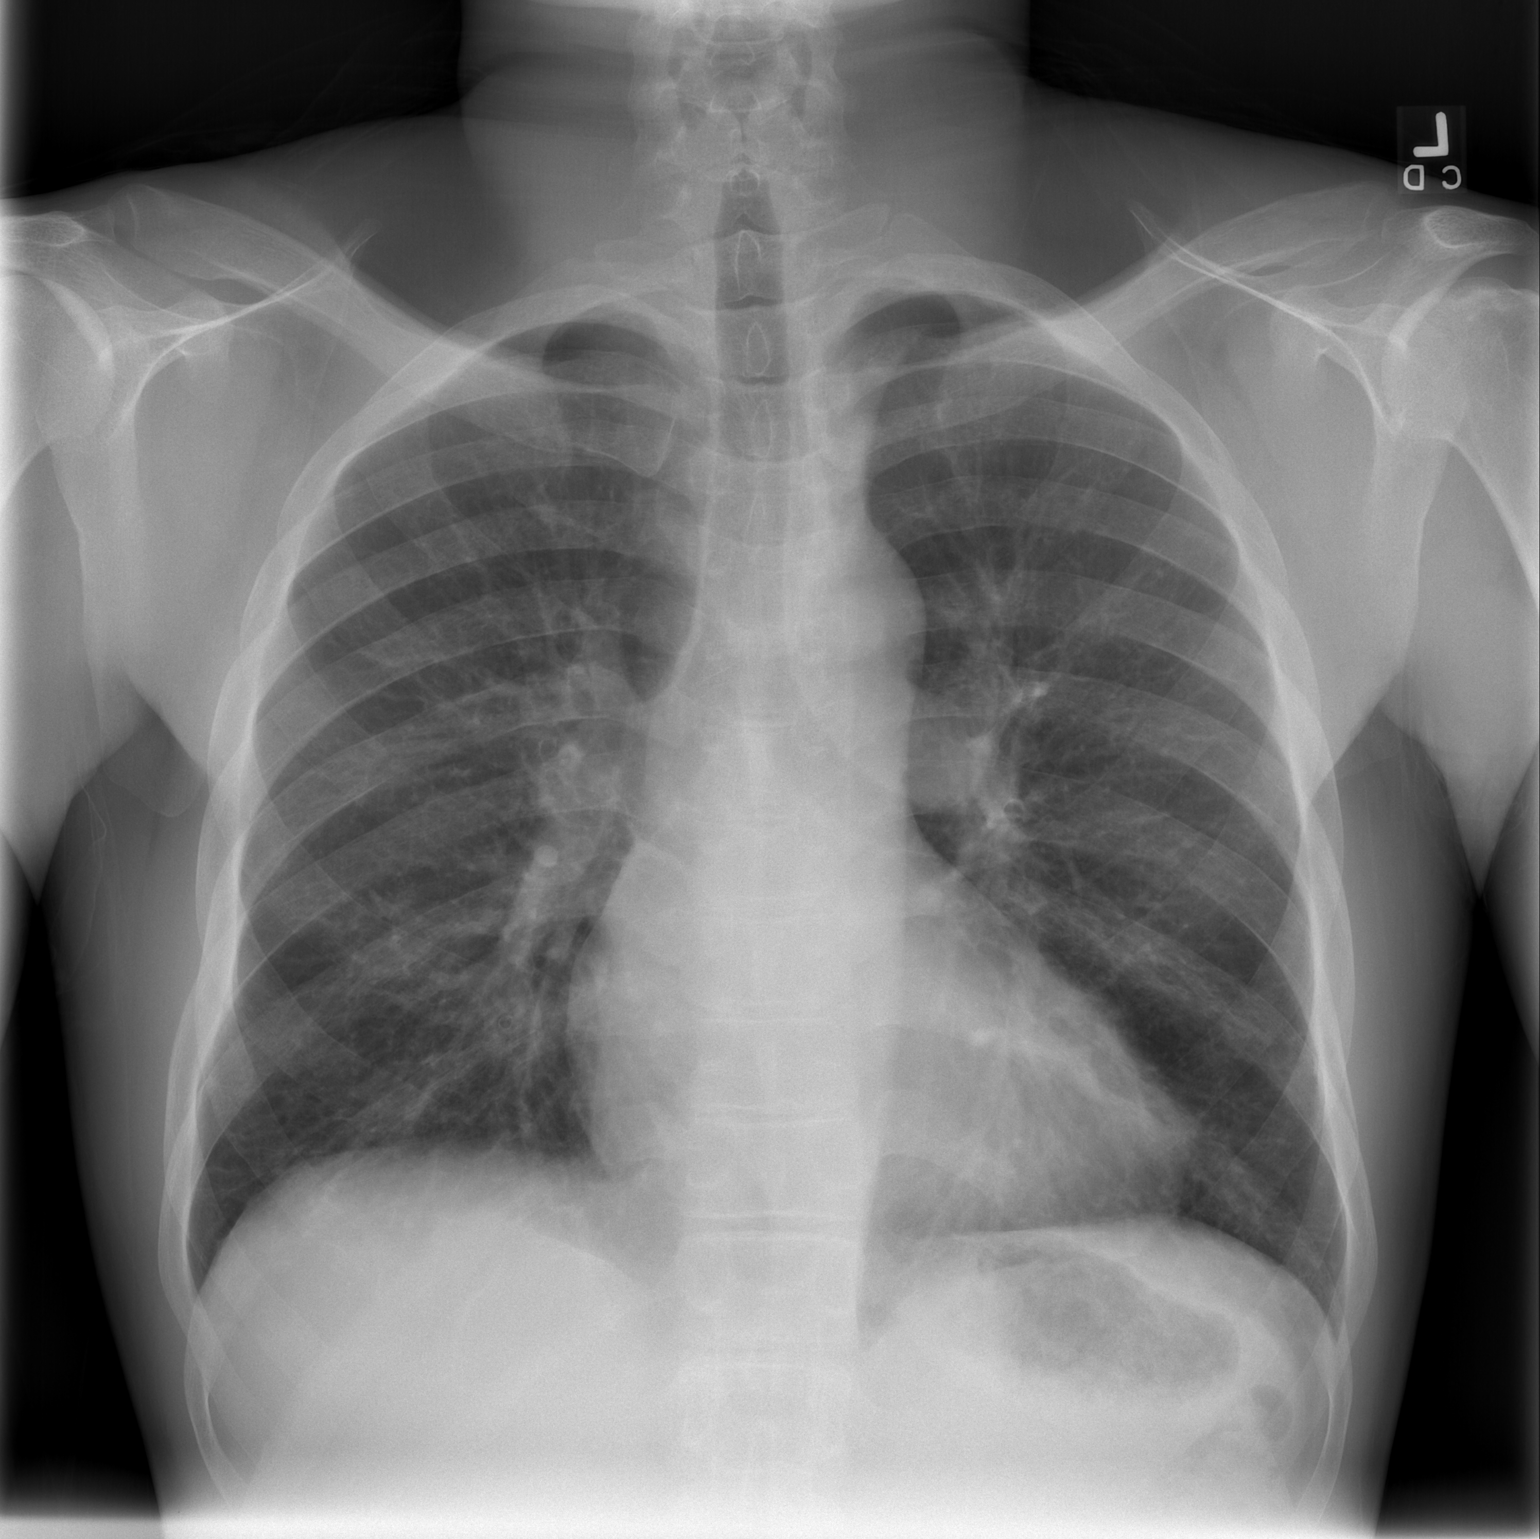

[w chest lat]
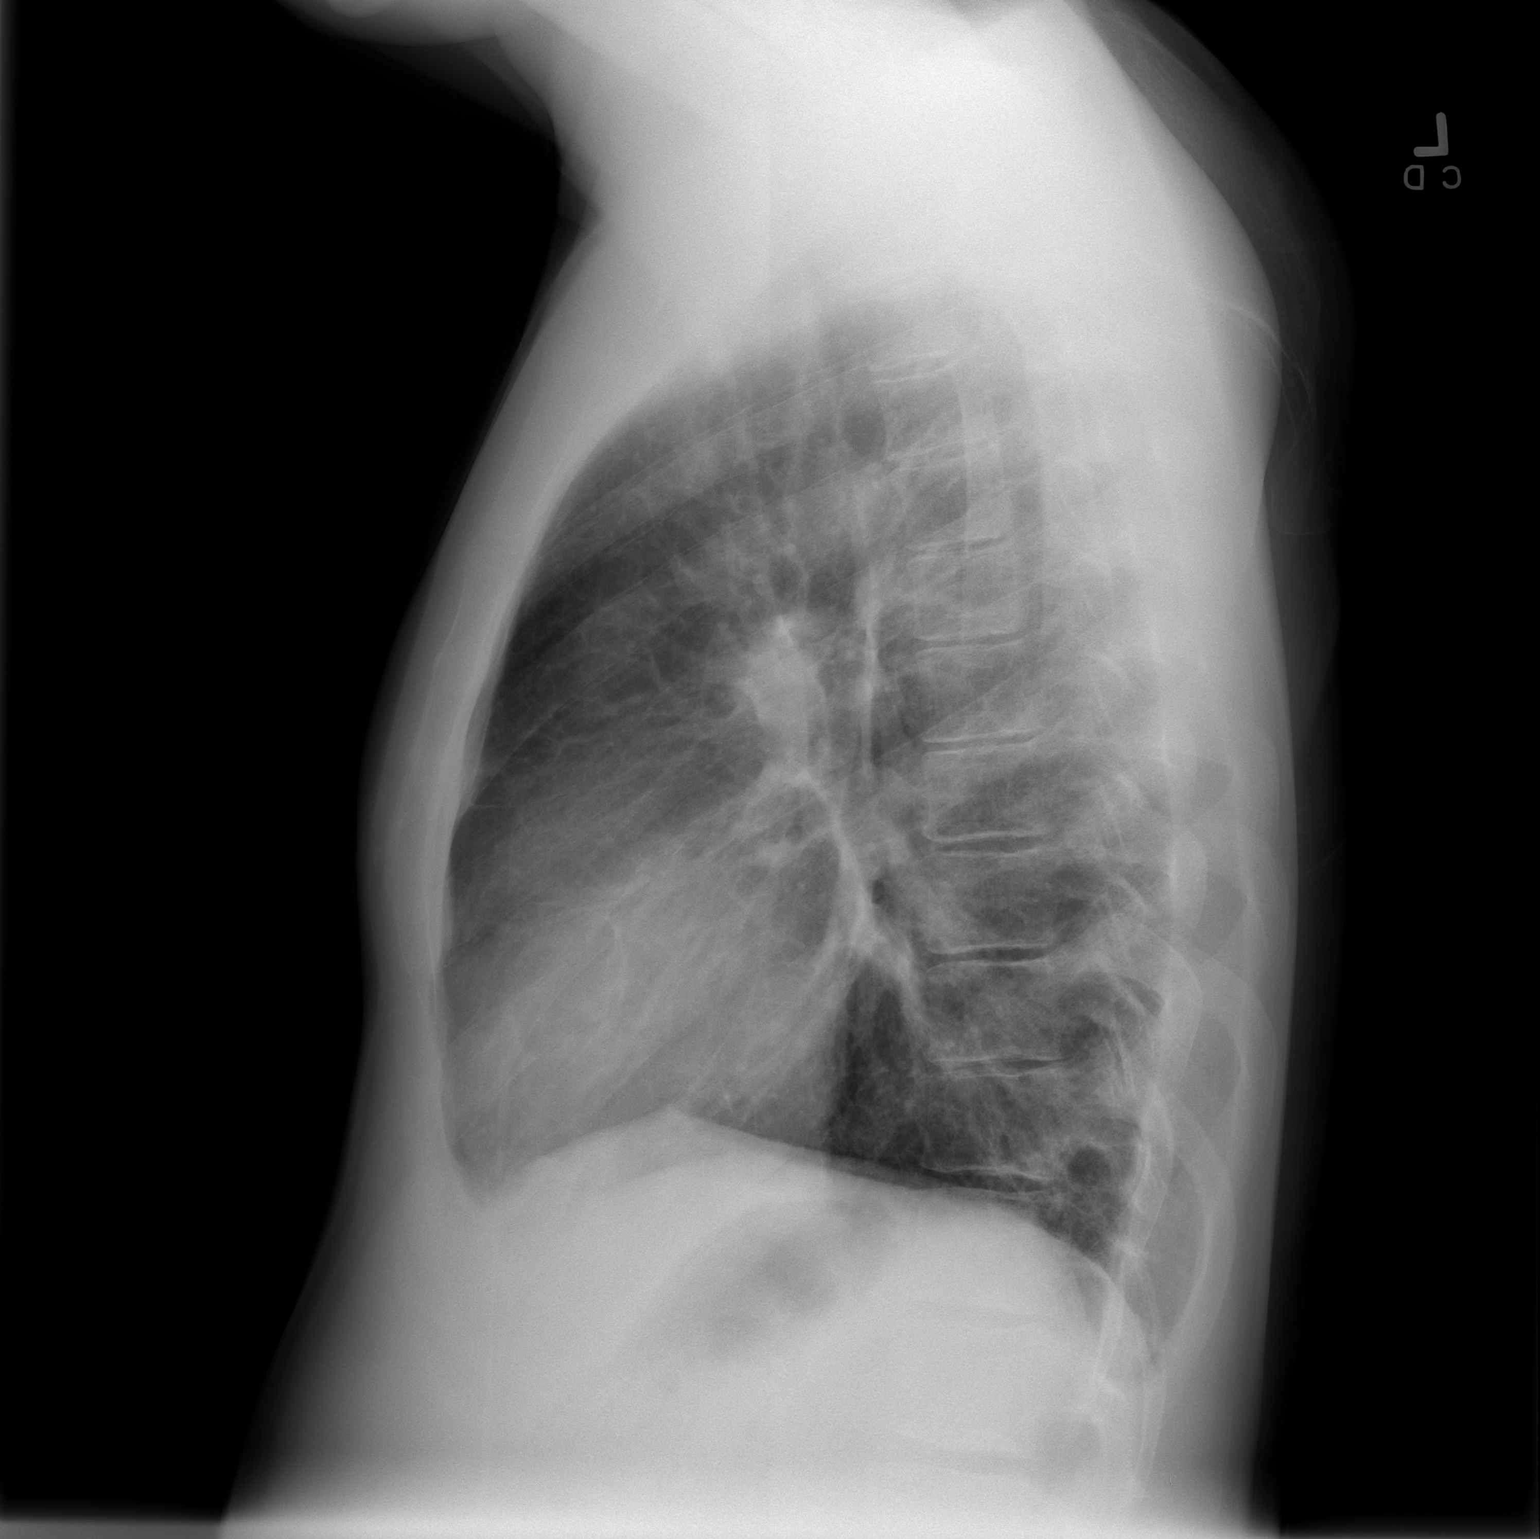

[2 of 2 positions shown; findings below may reference images not displayed]

FINDINGS: The basilar densities are less striking than on the prior
exam.

Mild cardiomegaly is present.  There is a suggestion of pulmonary
venous hypertension with mild cephalization of blood flow.

No pleural effusion is identified.  There is minimal bibasilar
scarring or subsegmental atelectasis.
IMPRESSION: 1.  Improved basilar opacities compared the prior exam.
2.  Mild cardiomegaly and pulmonary venous hypertension.

## 2011-09-08 MED ORDER — ACETAMINOPHEN 325 MG PO TABS
650.0000 mg | ORAL_TABLET | Freq: Once | ORAL | Status: AC
  Administered 2011-09-09: 650 mg via ORAL
  Filled 2011-09-08: qty 2

## 2011-09-08 MED ORDER — TEMAZEPAM 15 MG PO CAPS
15.0000 mg | ORAL_CAPSULE | Freq: Every evening | ORAL | Status: DC | PRN
  Administered 2011-09-08 – 2011-09-12 (×4): 15 mg via ORAL
  Filled 2011-09-08 (×4): qty 1

## 2011-09-08 MED ORDER — DIPHENHYDRAMINE HCL 50 MG/ML IJ SOLN: 25.0000 mg | Freq: Once | INTRAMUSCULAR | Status: AC

## 2011-09-08 NOTE — Progress Notes (Signed)
Subjective:   [name] was seen on rounds today.  He  continues to  ambulate in the halls without difficulty.Pain is more manageable with the increase in dosage.  Appetite good. Bowels moved today since lactulose was started .Pain is located in  Ribs, lower back and bilateral extremeties rates 6/10  Denies  SOB ,cough, fever, chills, urinary complaints or priapism. Objective:  Vital Signs in the last 24 hours: Temp:  [97.9 F (36.6 C)-99.6 F (37.6 C)] 97.9 F (36.6 C) (08/08 1446) Pulse Rate:  [68-90] 90  (08/08 1446) Resp:  [16-19] 18  (08/08 1446) BP: (110-148)/(73-90) 136/75 mmHg (08/08 1446) SpO2:  [96 %-98 %] 96 % (08/08 1446)  Intake/Output from previous day: 08/07 0701 - 08/08 0700 In: 3716.2 [P.O.:840; I.V.:1963.7; Blood:362.5; IV Piggyback:550] Out: 300  Intake/Output from this shift:    Physical Exam: General Appearance: Alert and oriented, cooperative, well developed, well nourished, mild distress  Head: Normocephalic, atraumatic  Eyes: PERRLA, EOMI, the patient has slight strabismus, scleral icterus  Nose: Nares normal, septum midline, mucosa normal, no drainage or sinus tenderness.  Throat: Normal lips, mucosa, tongue and gums  Back: Symmetric, lower back tenderness  Resp: CTA, no wheezes/rales/rhonchi  Cardio: S1, S2 regular, no murmurs/clicks/rubs/gallops  GI: Soft, non-tender, normoactive bowel sounds, no masses, no organomegaly  Male Genitalia: Deferred, the patient denies priapism  Extremities: Extremities normal, atraumatic, no cyanosis or edema, negative Homans sign, no sign of DVT, LE tenderness Pulses: 2+ and symmetric  Skin: Skin color, texture, turgor normal, no rashes or lesions, numerous tattoos  Neurologic: Grossly normal, CN II-XII intact, no focal deficits  Psych: Appropriate affect  Lab Results:  Basename 09/08/11 0433 09/07/11 0755  WBC 16.3* 15.7*  HGB 8.2* 8.7*  PLT 514* 583*    Basename 09/08/11 0433 09/07/11 0755  NA 136 132*  K  3.6 3.8  CL 105 101  CO2 21 21  GLUCOSE 93 106*  BUN 6 5*  CREATININE 0.59 0.58   No results found for this basename: TROPONINI:2,CK,MB:2 in the last 72 hours Hepatic Function Panel  Basename 09/08/11 0433  PROT 7.3  ALBUMIN 3.8  AST 35  ALT 27  ALKPHOS 132*  BILITOT 5.3*  BILIDIR --  IBILI --   No results found for this basename: CHOL in the last 72 hours No results found for this basename: PROTIME in the last 72 hours  Imaging: Imaging results have been reviewed and Ir Fluoro Guide Cv Line Right  09/07/2011  * RADIOLOGY REPORT *  Clinical data: Sickle cell anemia, needs venous access for transfusion  PICC PLACEMENT WITH ULTRASOUND AND FLUOROSCOPY  Technique: After written informed consent was obtained, patient was placed in the supine position on angiographic table. Patency of the right brachial vein was confirmed with ultrasound with image documentation. An appropriate skin site was determined. Skin site was marked. Region was prepped using maximum barrier technique including cap and mask, sterile gown, sterile gloves, large sterile sheet, and Chlorhexidine   as cutaneous antisepsis.  The region was infiltrated locally with 1% lidocaine.   Under real-time ultrasound guidance, the right brachial vein was accessed with a 21 gauge micropuncture needle; the needle tip within the vein was confirmed with ultrasound image documentation.   Needle exchanged over a 018 guidewire for a peel-away sheath, through which a 5-French double- lumen power injectable PICC trimmed to 41cm was advanced, positioned with its tip near the cavoatrial junction.  Spot chest radiograph confirms appropriate catheter position.  Catheter was flushed  per protocol and secured externally with 0-Prolene sutures. The patient tolerated procedure well, with no immediate complication.  IMPRESSION: Technically successful five [place] double lumen power injectable PICC placement  Original Report Authenticated By: [name], M.D.   Ir US Guide Vasc Access Right  09/07/2011  * RADIOLOGY REPORT *  Clinical data: Sickle cell anemia, needs venous access for transfusion  PICC PLACEMENT WITH ULTRASOUND AND FLUOROSCOPY  Technique: After written informed consent was obtained, patient was placed in the supine position on angiographic table. Patency of the right brachial vein was confirmed with ultrasound with image documentation. An appropriate skin site was determined. Skin site was marked. Region was prepped using maximum barrier technique including cap and mask, sterile gown, sterile gloves, large sterile sheet, and Chlorhexidine   as cutaneous antisepsis.  The region was infiltrated locally with 1% lidocaine.   Under real-time ultrasound guidance, the right brachial vein was accessed with a 21 gauge micropuncture needle; the needle tip within the vein was confirmed with ultrasound image documentation.   Needle exchanged over a 018 guidewire for a peel-away sheath, through which a 5-French double- lumen power injectable PICC trimmed to 41cm was advanced, positioned with its tip near the cavoatrial junction.  Spot chest radiograph confirms appropriate catheter position.  Catheter was flushed per protocol and secured externally with 0-Prolene sutures. The patient tolerated procedure well, with no immediate complication.  IMPRESSION: Technically successful five [place] double lumen power injectable PICC placement  Original Report Authenticated By: [name], M.D.    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 cont IVF, pain management, with exchange/transfusion ( will perform another exchange and remove 300cc and  1Unit PRBC's),  cont bowel and pruritis management   Marked leukocytosis- afebrile; ongoing  Hypokalemia:  Resolved History of PE:  DVT prophylaxis ( doppler negative); less pain; able to  ambulate Avascular necrosis of shoulders: stable  Insomnia-  Ambien prn Constipation: suspect   lactulose 30cc bid good relief  Hemochromatosis : on celation therapy  since 09/04/11 with Ferritin 2055 on 09/06/11 will need  On going tx with Ex jade at time of d/c   Disposition- plans unclear at this time due increase pain and evidence of increased hemolysis   LOS: 4 days    [name], [name] 09/08/2011, 5:30 PM

## 2011-09-08 NOTE — Progress Notes (Signed)
Patient has an order for an exchange transfusion.  Paged IV team.  They are aware of the order and will begin the exchange transfusion sometime this shift.

## 2011-09-09 DIAGNOSIS — D5700 Hb-SS disease with crisis, unspecified: Secondary | ICD-10-CM | POA: Diagnosis not present

## 2011-09-09 DIAGNOSIS — E876 Hypokalemia: Secondary | ICD-10-CM | POA: Diagnosis not present

## 2011-09-09 DIAGNOSIS — M729 Fibroblastic disorder, unspecified: Secondary | ICD-10-CM | POA: Diagnosis not present

## 2011-09-09 DIAGNOSIS — Z96649 Presence of unspecified artificial hip joint: Secondary | ICD-10-CM | POA: Diagnosis not present

## 2011-09-09 DIAGNOSIS — I2782 Chronic pulmonary embolism: Secondary | ICD-10-CM | POA: Diagnosis not present

## 2011-09-09 DIAGNOSIS — M87029 Idiopathic aseptic necrosis of unspecified humerus: Secondary | ICD-10-CM | POA: Diagnosis not present

## 2011-09-09 DIAGNOSIS — D571 Sickle-cell disease without crisis: Secondary | ICD-10-CM | POA: Diagnosis not present

## 2011-09-09 DIAGNOSIS — G4700 Insomnia, unspecified: Secondary | ICD-10-CM | POA: Diagnosis not present

## 2011-09-09 LAB — CBC WITH DIFFERENTIAL/PLATELET
Basophils Relative: 1 % (ref 0–1)
HCT: 21.1 % — ABNORMAL LOW (ref 39.0–52.0)
Hemoglobin: 7.4 g/dL — ABNORMAL LOW (ref 13.0–17.0)
Lymphocytes Relative: 19 % (ref 12–46)
Lymphs Abs: 3.1 10*3/uL (ref 0.7–4.0)
MCHC: 35.1 g/dL (ref 30.0–36.0)
Monocytes Absolute: 2.9 10*3/uL — ABNORMAL HIGH (ref 0.1–1.0)
Monocytes Relative: 18 % — ABNORMAL HIGH (ref 3–12)
Neutro Abs: 8.4 10*3/uL — ABNORMAL HIGH (ref 1.7–7.7)
Neutrophils Relative %: 52 % (ref 43–77)
RBC: 2.43 MIL/uL — ABNORMAL LOW (ref 4.22–5.81)
WBC: 16 10*3/uL — ABNORMAL HIGH (ref 4.0–10.5)

## 2011-09-09 LAB — COMPREHENSIVE METABOLIC PANEL
AST: 50 U/L — ABNORMAL HIGH (ref 0–37)
Albumin: 3.7 g/dL (ref 3.5–5.2)
Alkaline Phosphatase: 130 U/L — ABNORMAL HIGH (ref 39–117)
BUN: 5 mg/dL — ABNORMAL LOW (ref 6–23)
CO2: 23 mEq/L (ref 19–32)
Chloride: 103 mEq/L (ref 96–112)
Creatinine, Ser: 0.57 mg/dL (ref 0.50–1.35)
GFR calc non Af Amer: >90 mL/min (ref >90)
Potassium: 3.7 mEq/L (ref 3.5–5.1)
Total Bilirubin: 6.2 mg/dL — ABNORMAL HIGH (ref 0.3–1.2)

## 2011-09-09 LAB — LACTATE DEHYDROGENASE: LDH: 395 U/L — ABNORMAL HIGH (ref 94–250)

## 2011-09-09 LAB — RETICULOCYTES
RBC.: 2.43 MIL/uL — ABNORMAL LOW (ref 4.22–5.81)
Retic Ct Pct: 8.5 % — ABNORMAL HIGH (ref 0.4–3.1)

## 2011-09-09 NOTE — Progress Notes (Signed)
Exchange transfusion not yet performed.  IV team aware it is ordered.

## 2011-09-09 NOTE — Progress Notes (Signed)
Subjective:   [name] was seen on rounds today.  He continues to  ambulate in the halls without difficulty and his pain is improving. However, he noticed his eyes becoming "more yellow" and voiced concern. Appetite remains good and his bowels continue to move daily. His pain remains in the ribs, lower back and bilateral extremities, now rated 5/10. He continues to deny SOB ,cough, fever, chills, urinary complaints or priapism.  Objective:  Vital Signs in the last 24 hours: Temp:  [97.9 F (36.6 C)-98.5 F (36.9 C)] 98.2 F (36.8 C) (08/09 1130) Pulse Rate:  [72-90] 78  (08/09 1130) Resp:  [16-20] 20  (08/09 1130) BP: (119-139)/(75-87) 139/87 mmHg (08/09 1130) SpO2:  [96 %-98 %] 98 % (08/09 0614) Weight:  [88.3 kg (194 lb 10.7 oz)] 88.3 kg (194 lb 10.7 oz) (08/09 0981)  Intake/Output from previous day: 08/08 0701 - 08/09 0700 In: 2250 [P.O.:240; I.V.:1500; IV Piggyback:510] Out: -  Intake/Output from this shift: Total I/O In: 807.5 [P.O.:420; I.V.:250; Blood:137.5] Out: 300 [Other:300]  Physical Exam: General Appearance: Alert and oriented, cooperative, well developed, well nourished, mild distress  Eyes: PERRL, slight strabismus, with slightly increasingly icteric sclera  Back: Symmetric, lower back tenderness  Resp: CTA, no wheezes/rales/rhonchi  Cardio: S1, S2 regular, no murmurs/clicks/rubs/gallops  GI: Soft, non-tender, slightly distended with normoactive bowel sounds. Non palpable masses or organomegaly  Male Genitalia: Deferred, the patient denies priapism  Extremities: Extremities normal, atraumatic, no cyanosis or edema, negative Homans sign, no sign of DVT, LE tenderness Pulses: 2+ and symmetric  Skin: Skin color, texture, turgor normal, no rashes or lesions, numerous tattoos  Neurologic: Grossly intact, no focal deficits  Psych: Appropriate affect  Lab Results:  Basename 09/09/11 0631 09/08/11 0433  WBC 16.0* 16.3*  HGB 7.4* 8.2*  PLT 478* 514*    Basename  09/09/11 0631 09/08/11 0433  NA 134* 136  K 3.7 3.6  CL 103 105  CO2 23 21  GLUCOSE 104* 93  BUN 5* 6  CREATININE 0.57 0.59   No results found for this basename: TROPONINI:2,CK,MB:2 in the last 72 hours Hepatic Function Panel  Basename 09/09/11 0631  PROT 6.8  ALBUMIN 3.7  AST 50*  ALT 35  ALKPHOS 130*  BILITOT 6.2*  BILIDIR --  IBILI --   No results found for this basename: CHOL in the last 72 hours No results found for this basename: PROTIME in the last 72 hours  Imaging: Imaging results have been reviewed and Ir Fluoro Guide Cv Line Right  09/07/2011  * RADIOLOGY REPORT *  Clinical data: Sickle cell anemia, needs venous access for transfusion  PICC PLACEMENT WITH ULTRASOUND AND FLUOROSCOPY  Technique: After written informed consent was obtained, patient was placed in the supine position on angiographic table. Patency of the right brachial vein was confirmed with ultrasound with image documentation. An appropriate skin site was determined. Skin site was marked. Region was prepped using maximum barrier technique including cap and mask, sterile gown, sterile gloves, large sterile sheet, and Chlorhexidine   as cutaneous antisepsis.  The region was infiltrated locally with 1% lidocaine.   Under real-time ultrasound guidance, the right brachial vein was accessed with a 21 gauge micropuncture needle; the needle tip within the vein was confirmed with ultrasound image documentation.   Needle exchanged over a 018 guidewire for a peel-away sheath, through which a 5-French double- lumen power injectable PICC trimmed to 41cm was advanced, positioned with its tip near the cavoatrial junction.  Spot chest radiograph  confirms appropriate catheter position.  Catheter was flushed per protocol and secured externally with 0-Prolene sutures. The patient tolerated procedure well, with no immediate complication.  IMPRESSION: Technically successful five [place] double lumen power injectable PICC placement   Original Report Authenticated By: Osa Craver, M.D.   Ir US Guide Vasc Access Right  09/07/2011  * RADIOLOGY REPORT *  Clinical data: Sickle cell anemia, needs venous access for transfusion  PICC PLACEMENT WITH ULTRASOUND AND FLUOROSCOPY  Technique: After written informed consent was obtained, patient was placed in the supine position on angiographic table. Patency of the right brachial vein was confirmed with ultrasound with image documentation. An appropriate skin site was determined. Skin site was marked. Region was prepped using maximum barrier technique including cap and mask, sterile gown, sterile gloves, large sterile sheet, and Chlorhexidine   as cutaneous antisepsis.  The region was infiltrated locally with 1% lidocaine.   Under real-time ultrasound guidance, the right brachial vein was accessed with a 21 gauge micropuncture needle; the needle tip within the vein was confirmed with ultrasound image documentation.   Needle exchanged over a 018 guidewire for a peel-away sheath, through which a 5-French double- lumen power injectable PICC trimmed to 41cm was advanced, positioned with its tip near the cavoatrial junction.  Spot chest radiograph confirms appropriate catheter position.  Catheter was flushed per protocol and secured externally with 0-Prolene sutures. The patient tolerated procedure well, with no immediate complication.  IMPRESSION: Technically successful five [place] double lumen power injectable PICC placement  Original Report Authenticated By: Osa Craver, M.D.    Cardiac Studies:  Assessment/Plan:  Acute sickle cell crisis with ongoing active hemolysis; T.Bili still increasing (^6.2) today and sclera more icteric. Hgb now down 7.4; exchange transfusion currently in progress. Will check Fractionated Bili, LDH, Retic as well as get abdominal US to look closer at the liver. cont current pain, bowel and pruritis management.  Marked leukocytosis- WBC 16.0, remains  afebrile; continue to follow History of PE:  DVT prophylaxis with Lovenox ( doppler negative); less pain; able to ambulate Avascular necrosis of shoulders: stable  Insomnia-  Ambien prn Constipation: resolved Hemochromatosis : on celation therapy  since 09/04/11 with Ferritin 2055 on 09/06/11 will need  On going tx with Ex jade at time of d/c  Disposition- plans unclear at this time due increase pain and evidence of increased hemolysis. We have confered with Dr. [name] regarding the plan of care above.    LOS: 5 days    [name], [name] 09/09/2011, 11:48 AM

## 2011-09-10 ENCOUNTER — Inpatient Hospital Stay (HOSPITAL_COMMUNITY): Payer: Medicare Other

## 2011-09-10 DIAGNOSIS — D7289 Other specified disorders of white blood cells: Secondary | ICD-10-CM | POA: Diagnosis not present

## 2011-09-10 DIAGNOSIS — G4700 Insomnia, unspecified: Secondary | ICD-10-CM | POA: Diagnosis not present

## 2011-09-10 DIAGNOSIS — I2782 Chronic pulmonary embolism: Secondary | ICD-10-CM | POA: Diagnosis not present

## 2011-09-10 DIAGNOSIS — M87029 Idiopathic aseptic necrosis of unspecified humerus: Secondary | ICD-10-CM | POA: Diagnosis not present

## 2011-09-10 DIAGNOSIS — M2550 Pain in unspecified joint: Secondary | ICD-10-CM | POA: Diagnosis not present

## 2011-09-10 DIAGNOSIS — R17 Unspecified jaundice: Secondary | ICD-10-CM | POA: Diagnosis not present

## 2011-09-10 DIAGNOSIS — Z96649 Presence of unspecified artificial hip joint: Secondary | ICD-10-CM | POA: Diagnosis not present

## 2011-09-10 DIAGNOSIS — D5700 Hb-SS disease with crisis, unspecified: Secondary | ICD-10-CM | POA: Diagnosis not present

## 2011-09-10 DIAGNOSIS — E876 Hypokalemia: Secondary | ICD-10-CM | POA: Diagnosis not present

## 2011-09-10 LAB — COMPREHENSIVE METABOLIC PANEL
ALT: 42 U/L (ref 0–53)
AST: 49 U/L — ABNORMAL HIGH (ref 0–37)
Alkaline Phosphatase: 132 U/L — ABNORMAL HIGH (ref 39–117)
CO2: 23 mEq/L (ref 19–32)
Chloride: 105 mEq/L (ref 96–112)
GFR calc non Af Amer: >90 mL/min (ref >90)
Glucose, Bld: 116 mg/dL — ABNORMAL HIGH (ref 70–99)
Potassium: 3.9 mEq/L (ref 3.5–5.1)
Sodium: 136 mEq/L (ref 135–145)
Total Bilirubin: 4.2 mg/dL — ABNORMAL HIGH (ref 0.3–1.2)

## 2011-09-10 LAB — TYPE AND SCREEN: Unit division: 0

## 2011-09-10 LAB — CBC WITH DIFFERENTIAL/PLATELET
Basophils Relative: 2 % — ABNORMAL HIGH (ref 0–1)
Eosinophils Relative: 9 % — ABNORMAL HIGH (ref 0–5)
Hemoglobin: 8 g/dL — ABNORMAL LOW (ref 13.0–17.0)
Monocytes Relative: 16 % — ABNORMAL HIGH (ref 3–12)
Neutrophils Relative %: 47 % (ref 43–77)
Platelets: 499 10*3/uL — ABNORMAL HIGH (ref 150–400)
RBC: 2.66 MIL/uL — ABNORMAL LOW (ref 4.22–5.81)
WBC: 14.9 10*3/uL — ABNORMAL HIGH (ref 4.0–10.5)

## 2011-09-10 NOTE — Progress Notes (Signed)
Subjective:  Feeling better post exchange transfusion.  Objective:  Vital Signs in the last 24 hours: Temp:  [97.8 F (36.6 C)-98.3 F (36.8 C)] 98 F (36.7 C) (08/10 1007) Pulse Rate:  [71-85] 79  (08/10 1007) Cardiac Rhythm:  [-]  Resp:  [16-18] 18  (08/10 1007) BP: (112-129)/(74-83) 112/74 mmHg (08/10 1007) SpO2:  [97 %-98 %] 98 % (08/10 1007) Weight:  [86.637 kg (191 lb)] 86.637 kg (191 lb) (08/10 0537)  Physical Exam: BP Readings from Last 1 Encounters:  09/10/11 112/74    Wt Readings from Last 1 Encounters:  09/10/11 86.637 kg (191 lb)    Weight change: -1.663 kg (-3 lb 10.7 oz)  HEENT: [place]/AT, Eyes-Brown, PERL, EOMI, Conjunctiva-Pale, Sclera-icteric Neck: No JVD, No bruit, Trachea midline. Lungs:  Clear, Bilateral. Cardiac:  Regular rhythm, normal S1 and S2, no S3.  Abdomen:  Soft, non-tender. Extremities:  No edema present. No cyanosis. No clubbing. CNS: AxOx3, Cranial nerves grossly intact, moves all 4 extremities. Right handed. Skin: Warm and dry.   Intake/Output from previous day: 08/09 0701 - 08/10 0700 In: 7074.1 [P.O.:1500; I.V.:4216.5; Blood:335.4; IV Piggyback:1022.1] Out: 300     Lab Results: BMET    Component Value Date/Time   NA 136 09/10/2011 0600   K 3.9 09/10/2011 0600   CL 105 09/10/2011 0600   CO2 23 09/10/2011 0600   GLUCOSE 116* 09/10/2011 0600   BUN 6 09/10/2011 0600   CREATININE 0.59 09/10/2011 0600   CALCIUM 8.6 09/10/2011 0600   GFRNONAA >90 09/10/2011 0600   GFRAA >90 09/10/2011 0600   CBC    Component Value Date/Time   WBC 14.9* 09/10/2011 0600   RBC 2.66* 09/10/2011 0600   HGB 8.0* 09/10/2011 0600   HCT 23.0* 09/10/2011 0600   PLT 499* 09/10/2011 0600   MCV 86.5 09/10/2011 0600   MCH 30.1 09/10/2011 0600   MCHC 34.8 09/10/2011 0600   RDW 18.6* 09/10/2011 0600   LYMPHSABS 3.9 09/10/2011 0600   MONOABS 2.4* 09/10/2011 0600   EOSABS 1.3* 09/10/2011 0600   BASOSABS 0.3* 09/10/2011 0600   CARDIAC ENZYMES Lab Results  Component Value Date   CKTOTAL 136 04/23/2011   CKMB 1.5 04/23/2011   TROPONINI <0.30 04/23/2011    Assessment/Plan:  Patient Active Hospital Problem List: Acute sickle cell crisis with ongoing active hemolysis. Marked leukocytosis- WBC 16.0, remains afebrile; continue to follow  History of PE: DVT prophylaxis with Lovenox ( doppler negative); less pain; able to ambulate  Avascular necrosis of shoulders: stable  Insomnia- Ambien prn  Constipation: resolved  Hemochromatosis : on celation therapy since 09/04/11.     LOS: 6 days    Orpah Cobb  MD  09/10/2011, 2:47 PM

## 2011-09-11 DIAGNOSIS — E876 Hypokalemia: Secondary | ICD-10-CM | POA: Diagnosis not present

## 2011-09-11 DIAGNOSIS — D7289 Other specified disorders of white blood cells: Secondary | ICD-10-CM | POA: Diagnosis not present

## 2011-09-11 DIAGNOSIS — M87029 Idiopathic aseptic necrosis of unspecified humerus: Secondary | ICD-10-CM | POA: Diagnosis not present

## 2011-09-11 DIAGNOSIS — I2782 Chronic pulmonary embolism: Secondary | ICD-10-CM | POA: Diagnosis not present

## 2011-09-11 DIAGNOSIS — G4700 Insomnia, unspecified: Secondary | ICD-10-CM | POA: Diagnosis not present

## 2011-09-11 DIAGNOSIS — M2550 Pain in unspecified joint: Secondary | ICD-10-CM | POA: Diagnosis not present

## 2011-09-11 DIAGNOSIS — D5700 Hb-SS disease with crisis, unspecified: Secondary | ICD-10-CM | POA: Diagnosis not present

## 2011-09-11 DIAGNOSIS — Z96649 Presence of unspecified artificial hip joint: Secondary | ICD-10-CM | POA: Diagnosis not present

## 2011-09-11 LAB — COMPREHENSIVE METABOLIC PANEL
Albumin: 3.8 g/dL (ref 3.5–5.2)
BUN: 5 mg/dL — ABNORMAL LOW (ref 6–23)
Chloride: 102 mEq/L (ref 96–112)
Creatinine, Ser: 0.59 mg/dL (ref 0.50–1.35)
GFR calc non Af Amer: >90 mL/min (ref >90)
Total Bilirubin: 3.6 mg/dL — ABNORMAL HIGH (ref 0.3–1.2)

## 2011-09-11 LAB — CBC WITH DIFFERENTIAL/PLATELET
Basophils Absolute: 0.3 10*3/uL — ABNORMAL HIGH (ref 0.0–0.1)
Basophils Relative: 2 % — ABNORMAL HIGH (ref 0–1)
Eosinophils Absolute: 1.3 10*3/uL — ABNORMAL HIGH (ref 0.0–0.7)
Hemoglobin: 8.3 g/dL — ABNORMAL LOW (ref 13.0–17.0)
MCH: 30 pg (ref 26.0–34.0)
MCHC: 35 g/dL (ref 30.0–36.0)
Monocytes Absolute: 2.4 10*3/uL — ABNORMAL HIGH (ref 0.1–1.0)
Neutrophils Relative %: 43 % (ref 43–77)
Platelets: 528 10*3/uL — ABNORMAL HIGH (ref 150–400)
RDW: 18.5 % — ABNORMAL HIGH (ref 11.5–15.5)

## 2011-09-11 NOTE — Progress Notes (Signed)
Subjective:  No new complaint. T max 99.5 F  Objective:  Vital Signs in the last 24 hours: Temp:  [97.8 F (36.6 C)-99.5 F (37.5 C)] 98 F (36.7 C) (08/11 0928) Pulse Rate:  [72-78] 75  (08/11 0928) Cardiac Rhythm:  [-]  Resp:  [16-18] 16  (08/11 0928) BP: (117-145)/(72-86) 145/85 mmHg (08/11 0928) SpO2:  [97 %-100 %] 100 % (08/11 0928) Weight:  [88 kg (194 lb 0.1 oz)] 88 kg (194 lb 0.1 oz) (08/11 0521)  Physical Exam: BP Readings from Last 1 Encounters:  09/11/11 145/85    Wt Readings from Last 1 Encounters:  09/11/11 88 kg (194 lb 0.1 oz)    Weight change: 1.363 kg (3 lb 0.1 oz)  HEENT: [place]/AT, Eyes-Brown, PERL, EOMI, Conjunctiva-Pale, Sclera-icteric Neck: No JVD, No bruit, Trachea midline. Lungs:  Clear, Bilateral. Cardiac:  Regular rhythm, normal S1 and S2, no S3.  Abdomen:  Soft, non-tender. Extremities:  No edema present. No cyanosis. No clubbing. CNS: AxOx3, Cranial nerves grossly intact, moves all 4 extremities. Right handed. Skin: Warm and dry.   Intake/Output from previous day: 08/10 0701 - 08/11 0700 In: 3312.9 [P.O.:480; I.V.:2832.9] Out: -     Lab Results: BMET    Component Value Date/Time   NA 134* 09/11/2011 0545   K 3.7 09/11/2011 0545   CL 102 09/11/2011 0545   CO2 23 09/11/2011 0545   GLUCOSE 113* 09/11/2011 0545   BUN 5* 09/11/2011 0545   CREATININE 0.59 09/11/2011 0545   CALCIUM 8.8 09/11/2011 0545   GFRNONAA >90 09/11/2011 0545   GFRAA >90 09/11/2011 0545   CBC    Component Value Date/Time   WBC 14.3* 09/11/2011 0545   RBC 2.77* 09/11/2011 0545   HGB 8.3* 09/11/2011 0545   HCT 23.7* 09/11/2011 0545   PLT 528* 09/11/2011 0545   MCV 85.6 09/11/2011 0545   MCH 30.0 09/11/2011 0545   MCHC 35.0 09/11/2011 0545   RDW 18.5* 09/11/2011 0545   LYMPHSABS 4.1* 09/11/2011 0545   MONOABS 2.4* 09/11/2011 0545   EOSABS 1.3* 09/11/2011 0545   BASOSABS 0.3* 09/11/2011 0545   CARDIAC ENZYMES Lab Results  Component Value Date   CKTOTAL 136 04/23/2011   CKMB 1.5  04/23/2011   TROPONINI <0.30 04/23/2011    Assessment/Plan:  Patient Active Hospital Problem List:  Acute sickle cell crisis  Marked leukocytosis- WBC 14.3, remains afebrile; continue to follow  History of PE: DVT prophylaxis with Lovenox ( doppler negative); less pain; able to ambulate  Avascular necrosis of shoulders: stable  Insomnia- Ambien prn  Hemochromatosis : on celation therapy since 09/04/11.     LOS: 7 days    Orpah Cobb  MD  09/11/2011, 11:46 AM

## 2011-09-12 DIAGNOSIS — I2782 Chronic pulmonary embolism: Secondary | ICD-10-CM | POA: Diagnosis not present

## 2011-09-12 DIAGNOSIS — D5700 Hb-SS disease with crisis, unspecified: Secondary | ICD-10-CM | POA: Diagnosis not present

## 2011-09-12 DIAGNOSIS — E876 Hypokalemia: Secondary | ICD-10-CM | POA: Diagnosis not present

## 2011-09-12 DIAGNOSIS — Z96649 Presence of unspecified artificial hip joint: Secondary | ICD-10-CM | POA: Diagnosis not present

## 2011-09-12 DIAGNOSIS — D571 Sickle-cell disease without crisis: Secondary | ICD-10-CM | POA: Diagnosis not present

## 2011-09-12 DIAGNOSIS — M87029 Idiopathic aseptic necrosis of unspecified humerus: Secondary | ICD-10-CM | POA: Diagnosis not present

## 2011-09-12 DIAGNOSIS — G4700 Insomnia, unspecified: Secondary | ICD-10-CM | POA: Diagnosis not present

## 2011-09-12 DIAGNOSIS — M729 Fibroblastic disorder, unspecified: Secondary | ICD-10-CM | POA: Diagnosis not present

## 2011-09-12 LAB — COMPREHENSIVE METABOLIC PANEL
ALT: 42 U/L (ref 0–53)
Albumin: 4 g/dL (ref 3.5–5.2)
Alkaline Phosphatase: 133 U/L — ABNORMAL HIGH (ref 39–117)
Potassium: 3.6 mEq/L (ref 3.5–5.1)
Sodium: 135 mEq/L (ref 135–145)
Total Protein: 7.5 g/dL (ref 6.0–8.3)

## 2011-09-12 LAB — CBC WITH DIFFERENTIAL/PLATELET
Eosinophils Relative: 9 % — ABNORMAL HIGH (ref 0–5)
Monocytes Relative: 15 % — ABNORMAL HIGH (ref 3–12)
Neutrophils Relative %: 42 % — ABNORMAL LOW (ref 43–77)
Platelets: 531 10*3/uL — ABNORMAL HIGH (ref 150–400)
RBC: 2.76 MIL/uL — ABNORMAL LOW (ref 4.22–5.81)
WBC: 13.7 10*3/uL — ABNORMAL HIGH (ref 4.0–10.5)

## 2011-09-12 NOTE — Progress Notes (Signed)
Subjective:  Patient feeling better. Still with left side pain. Using incentive spirometer up to 2300. He rates his pain at 5/10. Feels he can soon manage pain at home. No other new complaints.   Allergies  Allergen Reactions  . Morphine And Related Hives and Rash    Pt states he is able to tolerate Dilaudid with no reactions.   Current Facility-Administered Medications  Medication Dose Route Frequency Provider Last Rate Last Dose  . deferoxamine (DESFERAL) 2,000 mg in dextrose 5 % 500 mL infusion  2,000 mg Intravenous QHS Grayce Sessions, NP 125 mL/hr at 09/11/11 2245 2,000 mg at 09/11/11 2245  . dextrose 5 %-0.45 % sodium chloride infusion   Intravenous Continuous Gwenyth Bender, MD 125 mL/hr at 09/12/11 1850    . diphenhydrAMINE (BENADRYL) capsule 25-50 mg  25-50 mg Oral Q4H PRN Gwenyth Bender, MD       Or  . diphenhydrAMINE (BENADRYL) injection 12.5-25 mg  12.5-25 mg Intravenous Q4H PRN Gwenyth Bender, MD   25 mg at 09/12/11 1618  . enoxaparin (LOVENOX) injection 40 mg  40 mg Subcutaneous Q24H Grayce Sessions, NP   40 mg at 09/11/11 2205  . folic acid (FOLVITE) tablet 1 mg  1 mg Oral Daily Grayce Sessions, NP   1 mg at 09/12/11 0929  . HYDROmorphone (DILAUDID) injection 2-4 mg  2-4 mg Intravenous Q2H PRN Grayce Sessions, NP   4 mg at 09/12/11 2014  . ibuprofen (ADVIL,MOTRIN) tablet 800 mg  800 mg Oral TID Grayce Sessions, NP   800 mg at 09/12/11 1626  . lactulose (CHRONULAC) 10 GM/15ML solution 20 g  20 g Oral BID Grayce Sessions, NP   20 g at 09/12/11 0929  . ondansetron (ZOFRAN) tablet 4 mg  4 mg Oral Q4H PRN Gwenyth Bender, MD       Or  . ondansetron [facility]) injection 4 mg  4 mg Intravenous Q4H PRN Gwenyth Bender, MD   4 mg at 09/12/11 2014  . oxyCODONE (OXYCONTIN) 12 hr tablet 80 mg  80 mg Oral Q12H Grayce Sessions, NP   80 mg at 09/12/11 0928  . pantoprazole (PROTONIX) EC tablet 40 mg  40 mg Oral BID AC Grayce Sessions, NP   40 mg at 09/12/11 1745  . potassium chloride  SA (K-DUR,KLOR-CON) CR tablet 10 mEq  10 mEq Oral BID Grayce Sessions, NP   10 mEq at 09/12/11 0928  . temazepam (RESTORIL) capsule 15 mg  15 mg Oral QHS PRN Grayce Sessions, NP   15 mg at 09/12/11 0047    Objective: Blood pressure 116/75, pulse 83, temperature 98.3 F (36.8 C), temperature source Oral, resp. rate 18, height 6' (1.829 m), weight 194 lb 7.1 oz (88.2 kg), SpO2 98.00%.  WDWN black male in no acute distress. HEENT:no sinus tenderness. Mouth sclera icterus. NECK:no enlarged thyroid. No posterior cervical nodes. LUNGS:decreased breath sounds left base. MIld left CVAT. NW:GNFAOZ S1, S2 without S3. HYQ:MVHQ left upper quadrant tenderness. Minimal epigastric tenderness. ION:GEXBMWUX Homans. No edema. NEURO:nonfocal.  Lab results: Results for orders placed during the hospital encounter of 09/04/11 (from the past 48 hour(s))  CBC WITH DIFFERENTIAL     Status: Abnormal   Collection Time   09/11/11  5:45 AM      Component Value Range Comment   WBC 14.3 (*) 4.0 - 10.5 K/uL WHITE COUNT CONFIRMED ON SMEAR   RBC 2.77 (*) 4.22 - 5.81 MIL/uL  Hemoglobin 8.3 (*) 13.0 - 17.0 g/dL    HCT 16.1 (*) 09.6 - 52.0 %    MCV 85.6  78.0 - 100.0 fL    MCH 30.0  26.0 - 34.0 pg    MCHC 35.0  30.0 - 36.0 g/dL    RDW 04.5 (*) 40.9 - 15.5 %    Platelets 528 (*) 150 - 400 K/uL PLATELET COUNT CONFIRMED BY SMEAR   Neutrophils Relative 43  43 - 77 %    Lymphocytes Relative 29  12 - 46 %    Monocytes Relative 17 (*) 3 - 12 %    Eosinophils Relative 9 (*) 0 - 5 %    Basophils Relative 2 (*) 0 - 1 %    Neutro Abs 6.2  1.7 - 7.7 K/uL    Lymphs Abs 4.1 (*) 0.7 - 4.0 K/uL    Monocytes Absolute 2.4 (*) 0.1 - 1.0 K/uL    Eosinophils Absolute 1.3 (*) 0.0 - 0.7 K/uL    Basophils Absolute 0.3 (*) 0.0 - 0.1 K/uL    RBC Morphology SICKLE CELLS      WBC Morphology TOXIC GRANULATION      Smear Review LARGE PLATELETS PRESENT     COMPREHENSIVE METABOLIC PANEL     Status: Abnormal   Collection Time    09/11/11  5:45 AM      Component Value Range Comment   Sodium 134 (*) 135 - 145 mEq/L    Potassium 3.7  3.5 - 5.1 mEq/L    Chloride 102  96 - 112 mEq/L    CO2 23  19 - 32 mEq/L    Glucose, Bld 113 (*) 70 - 99 mg/dL    BUN 5 (*) 6 - 23 mg/dL    Creatinine, Ser 8.11  0.50 - 1.35 mg/dL    Calcium 8.8  8.4 - 91.4 mg/dL    Total Protein 7.2  6.0 - 8.3 g/dL    Albumin 3.8  3.5 - 5.2 g/dL    AST 45 (*) 0 - 37 U/L    ALT 40  0 - 53 U/L    Alkaline Phosphatase 130 (*) 39 - 117 U/L    Total Bilirubin 3.6 (*) 0.3 - 1.2 mg/dL    GFR calc non Af Amer >90  >90 mL/min    GFR calc Af Amer >90  >90 mL/min   CBC WITH DIFFERENTIAL     Status: Abnormal   Collection Time   09/12/11  6:00 AM      Component Value Range Comment   WBC 13.7 (*) 4.0 - 10.5 K/uL    RBC 2.76 (*) 4.22 - 5.81 MIL/uL    Hemoglobin 8.2 (*) 13.0 - 17.0 g/dL    HCT 78.2 (*) 95.6 - 52.0 %    MCV 87.3  78.0 - 100.0 fL    MCH 29.7  26.0 - 34.0 pg    MCHC 34.0  30.0 - 36.0 g/dL    RDW 21.3 (*) 08.6 - 15.5 %    Platelets 531 (*) 150 - 400 K/uL    Neutrophils Relative 42 (*) 43 - 77 %    Lymphocytes Relative 32  12 - 46 %    Monocytes Relative 15 (*) 3 - 12 %    Eosinophils Relative 9 (*) 0 - 5 %    Basophils Relative 2 (*) 0 - 1 %    Neutro Abs 5.7  1.7 - 7.7 K/uL    Lymphs Abs 4.4 (*) 0.7 -  4.0 K/uL    Monocytes Absolute 2.1 (*) 0.1 - 1.0 K/uL    Eosinophils Absolute 1.2 (*) 0.0 - 0.7 K/uL    Basophils Absolute 0.3 (*) 0.0 - 0.1 K/uL    RBC Morphology RARE NRBCs      WBC Morphology WHITE COUNT CONFIRMED ON SMEAR   ATYPICAL LYMPHOCYTES   Smear Review PLATELET COUNT CONFIRMED BY SMEAR   LARGE PLATELETS PRESENT  COMPREHENSIVE METABOLIC PANEL     Status: Abnormal   Collection Time   09/12/11  6:00 AM      Component Value Range Comment   Sodium 135  135 - 145 mEq/L    Potassium 3.6  3.5 - 5.1 mEq/L    Chloride 101  96 - 112 mEq/L    CO2 24  19 - 32 mEq/L    Glucose, Bld 125 (*) 70 - 99 mg/dL    BUN 5 (*) 6 - 23 mg/dL     Creatinine, Ser 1.61  0.50 - 1.35 mg/dL    Calcium 8.8  8.4 - 09.6 mg/dL    Total Protein 7.5  6.0 - 8.3 g/dL    Albumin 4.0  3.5 - 5.2 g/dL    AST 41 (*) 0 - 37 U/L    ALT 42  0 - 53 U/L    Alkaline Phosphatase 133 (*) 39 - 117 U/L    Total Bilirubin 3.7 (*) 0.3 - 1.2 mg/dL    GFR calc non Af Amer >90  >90 mL/min    GFR calc Af Amer >90  >90 mL/min     Studies/Results: No results found.  Patient Active Problem List  Diagnosis  . Sickle cell pain crisis  . Leukocytosis  . Chronic pain  . Hypokalemia  . Metabolic acidosis  . Thrombocytosis  . Hemochromatosis  . Hypomagnesemia  . Acute chest syndrome due to sickle cell crisis  . Diarrhea  . Elevated brain natriuretic peptide (BNP) level  . Bronchitis  . Sickle cell anemia  . Acute embolism and thrombosis of right internal jugular vein  . Avascular necrosis  . Metabolic encephalopathy  . Increased ammonia level    Impression: Sickle cell crisis slowly resolving. Acute on chronic pain improved. Sickle cell lung disease. Avascular necrosis Secondary hemachromatosis.  Plan: Continue present therapy.Gradual taper. Progress toward discharge home. Chelation therapy continues.   [name], [name] 09/12/2011 9:43 PM

## 2011-09-13 DIAGNOSIS — I2782 Chronic pulmonary embolism: Secondary | ICD-10-CM | POA: Diagnosis not present

## 2011-09-13 DIAGNOSIS — D5700 Hb-SS disease with crisis, unspecified: Secondary | ICD-10-CM | POA: Diagnosis not present

## 2011-09-13 DIAGNOSIS — D571 Sickle-cell disease without crisis: Secondary | ICD-10-CM | POA: Diagnosis not present

## 2011-09-13 DIAGNOSIS — M729 Fibroblastic disorder, unspecified: Secondary | ICD-10-CM | POA: Diagnosis not present

## 2011-09-13 DIAGNOSIS — M87029 Idiopathic aseptic necrosis of unspecified humerus: Secondary | ICD-10-CM | POA: Diagnosis not present

## 2011-09-13 DIAGNOSIS — E876 Hypokalemia: Secondary | ICD-10-CM | POA: Diagnosis not present

## 2011-09-13 DIAGNOSIS — Z96649 Presence of unspecified artificial hip joint: Secondary | ICD-10-CM | POA: Diagnosis not present

## 2011-09-13 DIAGNOSIS — G4700 Insomnia, unspecified: Secondary | ICD-10-CM | POA: Diagnosis not present

## 2011-09-13 LAB — CBC WITH DIFFERENTIAL/PLATELET
Basophils Absolute: 0.3 10*3/uL — ABNORMAL HIGH (ref 0.0–0.1)
Basophils Relative: 2 % — ABNORMAL HIGH (ref 0–1)
HCT: 20.7 % — ABNORMAL LOW (ref 39.0–52.0)
Lymphs Abs: 4.4 10*3/uL — ABNORMAL HIGH (ref 0.7–4.0)
MCHC: 33.8 g/dL (ref 30.0–36.0)
MCV: 87 fL (ref 78.0–100.0)
Monocytes Relative: 16 % — ABNORMAL HIGH (ref 3–12)
Neutro Abs: 6 10*3/uL (ref 1.7–7.7)
RDW: 18.6 % — ABNORMAL HIGH (ref 11.5–15.5)
WBC: 14.1 10*3/uL — ABNORMAL HIGH (ref 4.0–10.5)

## 2011-09-13 LAB — COMPREHENSIVE METABOLIC PANEL
ALT: 43 U/L (ref 0–53)
Alkaline Phosphatase: 120 U/L — ABNORMAL HIGH (ref 39–117)
CO2: 21 mEq/L (ref 19–32)
Calcium: 8 mg/dL — ABNORMAL LOW (ref 8.4–10.5)
Chloride: 100 mEq/L (ref 96–112)
GFR calc Af Amer: >90 mL/min (ref >90)
GFR calc non Af Amer: >90 mL/min (ref >90)
Glucose, Bld: 448 mg/dL — ABNORMAL HIGH (ref 70–99)
Sodium: 130 mEq/L — ABNORMAL LOW (ref 135–145)
Total Bilirubin: 3.2 mg/dL — ABNORMAL HIGH (ref 0.3–1.2)

## 2011-09-13 LAB — PREPARE RBC (CROSSMATCH)

## 2011-09-13 MED ORDER — ACETAMINOPHEN 325 MG PO TABS
650.0000 mg | ORAL_TABLET | Freq: Once | ORAL | Status: AC
  Administered 2011-09-13: 650 mg via ORAL
  Filled 2011-09-13: qty 2

## 2011-09-13 MED ORDER — DIPHENHYDRAMINE HCL 50 MG/ML IJ SOLN: 25.0000 mg | Freq: Once | INTRAMUSCULAR | Status: AC

## 2011-09-13 NOTE — Progress Notes (Signed)
Subjective:  [name] was seen on rounds today states he is  feeling better. Continuing to ambulate in the hall way and using the incentive spirometry. Pain remains on the left side.  He rates his pain at 4/10. Feels he can manage his pain at home probably tomorrow plan discharging. Discussed tapering pain medications down but he feels he will benefit more with continuing at the current dose. No other new complaints.   Allergies  Allergen Reactions  . Morphine And Related Hives and Rash    Pt states he is able to tolerate Dilaudid with no reactions.   Current Facility-Administered Medications  Medication Dose Route Frequency Provider Last Rate Last Dose  . deferoxamine (DESFERAL) 2,000 mg in dextrose 5 % 500 mL infusion  2,000 mg Intravenous QHS Grayce Sessions, NP 125 mL/hr at 09/12/11 2230 2,000 mg at 09/12/11 2230  . dextrose 5 %-0.45 % sodium chloride infusion   Intravenous Continuous Gwenyth Bender, MD 125 mL/hr at 09/13/11 1026    . diphenhydrAMINE (BENADRYL) capsule 25-50 mg  25-50 mg Oral Q4H PRN Gwenyth Bender, MD       Or  . diphenhydrAMINE (BENADRYL) injection 12.5-25 mg  12.5-25 mg Intravenous Q4H PRN Gwenyth Bender, MD   25 mg at 09/13/11 1315  . enoxaparin (LOVENOX) injection 40 mg  40 mg Subcutaneous Q24H Grayce Sessions, NP   40 mg at 09/12/11 2229  . folic acid (FOLVITE) tablet 1 mg  1 mg Oral Daily Grayce Sessions, NP   1 mg at 09/13/11 0953  . HYDROmorphone (DILAUDID) injection 2-4 mg  2-4 mg Intravenous Q2H PRN Grayce Sessions, NP   4 mg at 09/13/11 1315  . ibuprofen (ADVIL,MOTRIN) tablet 800 mg  800 mg Oral TID Grayce Sessions, NP   800 mg at 09/13/11 0953  . lactulose (CHRONULAC) 10 GM/15ML solution 20 g  20 g Oral BID Grayce Sessions, NP   20 g at 09/13/11 0953  . ondansetron (ZOFRAN) tablet 4 mg  4 mg Oral Q4H PRN Gwenyth Bender, MD       Or  . ondansetron [facility]) injection 4 mg  4 mg Intravenous Q4H PRN Gwenyth Bender, MD   4 mg at 09/13/11 1315  . oxyCODONE  (OXYCONTIN) 12 hr tablet 80 mg  80 mg Oral Q12H Grayce Sessions, NP   80 mg at 09/13/11 0953  . pantoprazole (PROTONIX) EC tablet 40 mg  40 mg Oral BID AC Grayce Sessions, NP   40 mg at 09/13/11 0953  . potassium chloride SA (K-DUR,KLOR-CON) CR tablet 10 mEq  10 mEq Oral BID Grayce Sessions, NP   10 mEq at 09/13/11 0954  . temazepam (RESTORIL) capsule 15 mg  15 mg Oral QHS PRN Grayce Sessions, NP   15 mg at 09/12/11 0047    Objective: Blood pressure 117/81, pulse 74, temperature 98.2 F (36.8 C), temperature source Oral, resp. rate 14, height 6' (1.829 m), weight 88.2 kg (194 lb 7.1 oz), SpO2 95.00%.  General : Alert and oriented, cooperative, well developed, well nourished, mild distress  Eyes:  slight strabismus, mild icteric sclera (resolving) Back: Symmetric, lower back tenderness  Resp: CTA, no wheezes/rales/rhonchi  Cardio: S1, S2 regular, no murmurs/clicks/rubs/gallops  GI: Soft, non-tender, slightly distended with normoactive bowel sounds. Non palpable masses or organomegaly  Male Genitalia: Deferred, the patient denies priapism  Extremities: Extremities normal, atraumatic, no cyanosis or edema, negative Homans sign, no sign of DVT,  LE tenderness  Pulses: 2+ and symmetric  Skin: Skin color, texture, turgor normal, no rashes or lesions, numerous tattoos  Neurologic: Grossly intact, no focal deficits  Psych: Appropriate affect   Lab results: Results for orders placed during the hospital encounter of 09/04/11 (from the past 48 hour(s))  CBC WITH DIFFERENTIAL     Status: Abnormal   Collection Time   09/12/11  6:00 AM      Component Value Range Comment   WBC 13.7 (*) 4.0 - 10.5 K/uL    RBC 2.76 (*) 4.22 - 5.81 MIL/uL    Hemoglobin 8.2 (*) 13.0 - 17.0 g/dL    HCT 40.9 (*) 81.1 - 52.0 %    MCV 87.3  78.0 - 100.0 fL    MCH 29.7  26.0 - 34.0 pg    MCHC 34.0  30.0 - 36.0 g/dL    RDW 91.4 (*) 78.2 - 15.5 %    Platelets 531 (*) 150 - 400 K/uL    Neutrophils Relative 42  (*) 43 - 77 %    Lymphocytes Relative 32  12 - 46 %    Monocytes Relative 15 (*) 3 - 12 %    Eosinophils Relative 9 (*) 0 - 5 %    Basophils Relative 2 (*) 0 - 1 %    Neutro Abs 5.7  1.7 - 7.7 K/uL    Lymphs Abs 4.4 (*) 0.7 - 4.0 K/uL    Monocytes Absolute 2.1 (*) 0.1 - 1.0 K/uL    Eosinophils Absolute 1.2 (*) 0.0 - 0.7 K/uL    Basophils Absolute 0.3 (*) 0.0 - 0.1 K/uL    RBC Morphology RARE NRBCs      WBC Morphology WHITE COUNT CONFIRMED ON SMEAR   ATYPICAL LYMPHOCYTES   Smear Review PLATELET COUNT CONFIRMED BY SMEAR   LARGE PLATELETS PRESENT  COMPREHENSIVE METABOLIC PANEL     Status: Abnormal   Collection Time   09/12/11  6:00 AM      Component Value Range Comment   Sodium 135  135 - 145 mEq/L    Potassium 3.6  3.5 - 5.1 mEq/L    Chloride 101  96 - 112 mEq/L    CO2 24  19 - 32 mEq/L    Glucose, Bld 125 (*) 70 - 99 mg/dL    BUN 5 (*) 6 - 23 mg/dL    Creatinine, Ser 9.56  0.50 - 1.35 mg/dL    Calcium 8.8  8.4 - 21.3 mg/dL    Total Protein 7.5  6.0 - 8.3 g/dL    Albumin 4.0  3.5 - 5.2 g/dL    AST 41 (*) 0 - 37 U/L    ALT 42  0 - 53 U/L    Alkaline Phosphatase 133 (*) 39 - 117 U/L    Total Bilirubin 3.7 (*) 0.3 - 1.2 mg/dL    GFR calc non Af Amer >90  >90 mL/min    GFR calc Af Amer >90  >90 mL/min   CBC WITH DIFFERENTIAL     Status: Abnormal   Collection Time   09/13/11  7:02 AM      Component Value Range Comment   WBC 14.1 (*) 4.0 - 10.5 K/uL    RBC 2.38 (*) 4.22 - 5.81 MIL/uL    Hemoglobin 7.0 (*) 13.0 - 17.0 g/dL    HCT 08.6 (*) 57.8 - 52.0 %    MCV 87.0  78.0 - 100.0 fL    MCH 29.4  26.0 - 34.0  pg    MCHC 33.8  30.0 - 36.0 g/dL    RDW 14.7 (*) 82.9 - 15.5 %    Platelets 453 (*) 150 - 400 K/uL    Neutrophils Relative 43  43 - 77 %    Lymphocytes Relative 31  12 - 46 %    Monocytes Relative 16 (*) 3 - 12 %    Eosinophils Relative 8 (*) 0 - 5 %    Basophils Relative 2 (*) 0 - 1 %    Neutro Abs 6.0  1.7 - 7.7 K/uL    Lymphs Abs 4.4 (*) 0.7 - 4.0 K/uL    Monocytes  Absolute 2.3 (*) 0.1 - 1.0 K/uL    Eosinophils Absolute 1.1 (*) 0.0 - 0.7 K/uL    Basophils Absolute 0.3 (*) 0.0 - 0.1 K/uL    RBC Morphology POLYCHROMASIA PRESENT      WBC Morphology WHITE COUNT CONFIRMED ON SMEAR   ATYPICAL LYMPHOCYTES   Smear Review PLATELET COUNT CONFIRMED BY SMEAR   LARGE PLATELETS PRESENT  COMPREHENSIVE METABOLIC PANEL     Status: Abnormal   Collection Time   09/13/11  7:02 AM      Component Value Range Comment   Sodium 130 (*) 135 - 145 mEq/L    Potassium 3.5  3.5 - 5.1 mEq/L    Chloride 100  96 - 112 mEq/L    CO2 21  19 - 32 mEq/L    Glucose, Bld 448 (*) 70 - 99 mg/dL    BUN 5 (*) 6 - 23 mg/dL    Creatinine, Ser 5.62  0.50 - 1.35 mg/dL    Calcium 8.0 (*) 8.4 - 10.5 mg/dL    Total Protein 6.5  6.0 - 8.3 g/dL    Albumin 3.4 (*) 3.5 - 5.2 g/dL    AST 42 (*) 0 - 37 U/L    ALT 43  0 - 53 U/L    Alkaline Phosphatase 120 (*) 39 - 117 U/L    Total Bilirubin 3.2 (*) 0.3 - 1.2 mg/dL    GFR calc non Af Amer >90  >90 mL/min    GFR calc Af Amer >90  >90 mL/min     Studies/Results: No results found.  Patient Active Problem List  Diagnosis  . Sickle cell pain crisis  . Leukocytosis  . Chronic pain  . Hypokalemia  . Metabolic acidosis  . Thrombocytosis  . Hemochromatosis  . Hypomagnesemia  . Acute chest syndrome due to sickle cell crisis  . Diarrhea  . Elevated brain natriuretic peptide (BNP) level  . Bronchitis  . Sickle cell anemia  . Acute embolism and thrombosis of right internal jugular vein  . Avascular necrosis  . Metabolic encephalopathy  . Increased ammonia level    Impression/Plan: Acute sickle cell crisis with ongoing active hemolysis slowly resolving  T.Bili still decreasing now(3.2) mild sclera icteric. Hgb now down 7.0   Will transfuse today.  Cont current pain, bowel and pruritis management.  Anemia : secondary to SCD will type /cross transfuse 1 unit of PRBC repeat labs in AM  Leukocytosis- WBC 14.1, remains afebrile; continue to follow    Hyponatremia: will reduce IVF ?? Dilutional  History of PE: DVT prophylaxis with Lovenox ( doppler negative); pain resolved in LE able to ambulate  Avascular necrosis of shoulders: stable  Insomnia-Restoril prn seemed to work better than the previous used Ambien   Hemochromatosis : on celation therapy since 09/04/11 with Ferritin 2055 on 09/06/11 will need On  going tx with Ex jade at time of d/c   Disposition- 24 hour discharge plans   [name] P 09/13/2011 2:35 PM

## 2011-09-14 DIAGNOSIS — G4700 Insomnia, unspecified: Secondary | ICD-10-CM | POA: Diagnosis not present

## 2011-09-14 DIAGNOSIS — M87029 Idiopathic aseptic necrosis of unspecified humerus: Secondary | ICD-10-CM | POA: Diagnosis not present

## 2011-09-14 DIAGNOSIS — D5700 Hb-SS disease with crisis, unspecified: Secondary | ICD-10-CM | POA: Diagnosis not present

## 2011-09-14 DIAGNOSIS — E876 Hypokalemia: Secondary | ICD-10-CM | POA: Diagnosis not present

## 2011-09-14 DIAGNOSIS — Z96649 Presence of unspecified artificial hip joint: Secondary | ICD-10-CM | POA: Diagnosis not present

## 2011-09-14 DIAGNOSIS — I2782 Chronic pulmonary embolism: Secondary | ICD-10-CM | POA: Diagnosis not present

## 2011-09-14 LAB — COMPREHENSIVE METABOLIC PANEL
AST: 53 U/L — ABNORMAL HIGH (ref 0–37)
Albumin: 3.8 g/dL (ref 3.5–5.2)
BUN: 6 mg/dL (ref 6–23)
CO2: 24 mEq/L (ref 19–32)
Calcium: 8.7 mg/dL (ref 8.4–10.5)
Creatinine, Ser: 0.61 mg/dL (ref 0.50–1.35)
GFR calc non Af Amer: >90 mL/min (ref >90)
Total Bilirubin: 3.2 mg/dL — ABNORMAL HIGH (ref 0.3–1.2)

## 2011-09-14 LAB — CBC WITH DIFFERENTIAL/PLATELET
Basophils Absolute: 0.3 10*3/uL — ABNORMAL HIGH (ref 0.0–0.1)
Eosinophils Absolute: 1.5 10*3/uL — ABNORMAL HIGH (ref 0.0–0.7)
Lymphocytes Relative: 28 % (ref 12–46)
MCH: 29.7 pg (ref 26.0–34.0)
MCHC: 34.3 g/dL (ref 30.0–36.0)
Monocytes Absolute: 2.7 10*3/uL — ABNORMAL HIGH (ref 0.1–1.0)
Neutrophils Relative %: 42 % — ABNORMAL LOW (ref 43–77)
RDW: 18 % — ABNORMAL HIGH (ref 11.5–15.5)

## 2011-09-14 MED ORDER — ZOLPIDEM TARTRATE 10 MG PO TABS: 10.0000 mg | ORAL_TABLET | Freq: Every evening | ORAL | Status: DC | PRN

## 2011-09-14 MED ORDER — HYDROMORPHONE HCL 4 MG PO TABS: 4.0000 mg | ORAL_TABLET | ORAL | Status: DC | PRN

## 2011-09-14 MED ORDER — PANTOPRAZOLE SODIUM 40 MG PO TBEC: 40.0000 mg | DELAYED_RELEASE_TABLET | Freq: Two times a day (BID) | ORAL | Status: AC

## 2011-09-14 MED ORDER — POTASSIUM CHLORIDE CRYS ER 20 MEQ PO TBCR: 20.0000 meq | EXTENDED_RELEASE_TABLET | Freq: Two times a day (BID) | ORAL | Status: DC

## 2011-09-14 MED ORDER — OXYCODONE HCL 80 MG PO TB12: 80.0000 mg | ORAL_TABLET | Freq: Two times a day (BID) | ORAL | Status: DC

## 2011-09-14 NOTE — Plan of Care (Signed)
Problem: Phase III Progression Outcomes Goal: IV fluids wean to po Outcome: Completed/Met Date Met:  09/14/11 IV at [facility] for iv pushes of pain and other intravenous meds

## 2011-09-14 NOTE — Progress Notes (Signed)
Patient discharged to home. DC instructions given using teachback. No concerns voiced. Prescriptions x 5 given . Left unit ambulating to catch ride downstairs. Refused to be transported by wheelchair. Left in good condition. piccline removed by IV team prior to DC. Pt waited 30 minutes with no bleeding noted on dsg prior to leaving.

## 2011-09-14 NOTE — Progress Notes (Signed)
R PICC line d/c'ed per order for d/c to home.  SIte without signs of infection.  Verbalizes understanding via teach back method of site care, signs of infection and interventions of bleeding at the site.  No ADN.

## 2011-09-15 LAB — TYPE AND SCREEN: Unit division: 0

## 2011-10-01 ENCOUNTER — Non-Acute Institutional Stay (HOSPITAL_COMMUNITY)
Admission: AD | Admit: 2011-10-01 | Discharge: 2011-10-02 | Disposition: A | Discharge status: Home / Self Care | Payer: Medicare Other | Attending: Internal Medicine | Admitting: Internal Medicine

## 2011-10-01 ENCOUNTER — Telehealth (HOSPITAL_COMMUNITY): Payer: Self-pay

## 2011-10-01 DIAGNOSIS — D72829 Elevated white blood cell count, unspecified: Secondary | ICD-10-CM | POA: Diagnosis not present

## 2011-10-01 DIAGNOSIS — R52 Pain, unspecified: Secondary | ICD-10-CM | POA: Insufficient documentation

## 2011-10-01 DIAGNOSIS — I517 Cardiomegaly: Secondary | ICD-10-CM | POA: Insufficient documentation

## 2011-10-01 DIAGNOSIS — G8929 Other chronic pain: Secondary | ICD-10-CM | POA: Insufficient documentation

## 2011-10-01 DIAGNOSIS — D5700 Hb-SS disease with crisis, unspecified: Secondary | ICD-10-CM | POA: Diagnosis not present

## 2011-10-01 DIAGNOSIS — E876 Hypokalemia: Secondary | ICD-10-CM | POA: Insufficient documentation

## 2011-10-01 DIAGNOSIS — Z79899 Other long term (current) drug therapy: Secondary | ICD-10-CM | POA: Diagnosis not present

## 2011-10-01 LAB — COMPREHENSIVE METABOLIC PANEL
Alkaline Phosphatase: 94 U/L (ref 39–117)
BUN: <3 mg/dL — ABNORMAL LOW (ref 6–23)
CO2: 22 mEq/L (ref 19–32)
Chloride: 102 mEq/L (ref 96–112)
GFR calc Af Amer: >90 mL/min (ref >90)
Glucose, Bld: 121 mg/dL — ABNORMAL HIGH (ref 70–99)
Potassium: 2.5 mEq/L — CL (ref 3.5–5.1)
Total Bilirubin: 2.5 mg/dL — ABNORMAL HIGH (ref 0.3–1.2)

## 2011-10-01 LAB — RETICULOCYTES: Retic Ct Pct: 10.5 % — ABNORMAL HIGH (ref 0.4–3.1)

## 2011-10-01 LAB — CBC WITH DIFFERENTIAL/PLATELET
Eosinophils Relative: 1 % (ref 0–5)
Lymphs Abs: 2.3 10*3/uL (ref 0.7–4.0)
MCH: 29.6 pg (ref 26.0–34.0)
MCV: 87.8 fL (ref 78.0–100.0)
Monocytes Absolute: 2.1 10*3/uL — ABNORMAL HIGH (ref 0.1–1.0)
Platelets: 427 10*3/uL — ABNORMAL HIGH (ref 150–400)
RBC: 3.11 MIL/uL — ABNORMAL LOW (ref 4.22–5.81)
RDW: 17.1 % — ABNORMAL HIGH (ref 11.5–15.5)

## 2011-10-01 MED ORDER — HYDROXYUREA 500 MG PO CAPS
500.0000 mg | ORAL_CAPSULE | Freq: Every day | ORAL | Status: DC
  Administered 2011-10-01 – 2011-10-02 (×2): 500 mg via ORAL
  Filled 2011-10-01 (×2): qty 1

## 2011-10-01 MED ORDER — DIPHENHYDRAMINE HCL 50 MG/ML IJ SOLN
12.5000 mg | INTRAMUSCULAR | Status: DC | PRN
  Administered 2011-10-01 – 2011-10-02 (×6): 25 mg via INTRAVENOUS
  Filled 2011-10-01 (×6): qty 1

## 2011-10-01 MED ORDER — DIPHENHYDRAMINE HCL 25 MG PO CAPS: 25.0000 mg | ORAL_CAPSULE | ORAL | Status: DC | PRN

## 2011-10-01 MED ORDER — DEXTROSE-NACL 5-0.45 % IV SOLN
INTRAVENOUS | Status: DC
  Administered 2011-10-01 (×2): via INTRAVENOUS

## 2011-10-01 MED ORDER — POTASSIUM CHLORIDE 10 MEQ/100ML IV SOLN
10.0000 meq | INTRAVENOUS | Status: AC
  Administered 2011-10-01 (×3): 10 meq via INTRAVENOUS
  Filled 2011-10-01 (×3): qty 100

## 2011-10-01 MED ORDER — ONDANSETRON HCL 4 MG/2ML IJ SOLN
4.0000 mg | INTRAMUSCULAR | Status: DC | PRN
  Administered 2011-10-01 – 2011-10-02 (×6): 4 mg via INTRAVENOUS
  Filled 2011-10-01 (×6): qty 2

## 2011-10-01 MED ORDER — HYDROMORPHONE HCL PF 2 MG/ML IJ SOLN
2.0000 mg | INTRAMUSCULAR | Status: DC | PRN
  Administered 2011-10-01 – 2011-10-02 (×11): 4 mg via INTRAVENOUS
  Filled 2011-10-01 (×11): qty 2

## 2011-10-01 MED ORDER — ONDANSETRON HCL 4 MG PO TABS: 4.0000 mg | ORAL_TABLET | ORAL | Status: DC | PRN

## 2011-10-01 MED ORDER — DEXTROSE 5 % IV SOLN
2000.0000 mg | Freq: Every day | INTRAVENOUS | Status: DC
  Administered 2011-10-01: 2000 mg via INTRAVENOUS
  Filled 2011-10-01: qty 2

## 2011-10-01 MED ORDER — FOLIC ACID 1 MG PO TABS
1.0000 mg | ORAL_TABLET | Freq: Every day | ORAL | Status: DC
  Administered 2011-10-01 – 2011-10-02 (×2): 1 mg via ORAL
  Filled 2011-10-01 (×2): qty 1

## 2011-10-01 NOTE — H&P (Signed)
[facility] History and Physical   Date: 10/01/2011  Patient name: [name] Medical record number: [id number] Date of birth: 01/20/1981 Age: 31 y.o. Gender: male PCP: [name], MD  Attending physician: Gwenyth Bender, MD  Chief Complaint:   History of Present Illness:  [name] is a 31 y.o. African American male who was admitted to the sickle cell center for observation on with sickle cell crisis and hypokalemia. His main complaint is pain in legs that where continuing to ache this was not relieved by his home pain meds. Rates pain 8/10.     Meds: Prescriptions prior to admission  Medication Sig Dispense Refill  . folic acid (FOLVITE) 1 MG tablet Take 1 mg by mouth daily.      Marland Kitchen HYDROmorphone (DILAUDID) 4 MG tablet Take 1 tablet (4 mg total) by mouth every 4 (four) hours as needed. For pain.  60 tablet  0  . hydroxyurea (HYDREA) 500 MG capsule Take 500 mg by mouth 2 (two) times daily. May take with food to minimize GI side effects.      Marland Kitchen ibuprofen (ADVIL,MOTRIN) 800 MG tablet Take 800 mg by mouth every 8 (eight) hours as needed. For pain.      Marland Kitchen oxyCODONE (OXYCONTIN) 80 MG 12 hr tablet Take 1 tablet (80 mg total) by mouth every 12 (twelve) hours.  30 tablet  0  . potassium chloride SA (K-DUR,KLOR-CON) 20 MEQ tablet Take 1 tablet (20 mEq total) by mouth 2 (two) times daily.  60 tablet  1  . zolpidem (AMBIEN) 10 MG tablet Take 1 tablet (10 mg total) by mouth at bedtime as needed for sleep. For sleep.  30 tablet  1    Allergies: Morphine and related Past Medical History  Diagnosis Date  . Sickle cell anemia   . Blood transfusion   . 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 Hypokalemia   . Mood disorder   . Pulmonary embolism   . Avascular necrosis   . Leukocytosis     Chronic  . Thrombocytosis     Chronic   Past Surgical History  Procedure Date  . Right hip replacement     08/2006  . Cholecystectomy    01/2008  . Porta cath placement   . Porta cath removal   . Umbilical hernia repair     01/2008  . Excision of left periauricular cyst     10/2009  . Excision of right ear lobe cyst with primary closur     11/2007   Family History  Problem Relation Age of Onset  . Sickle cell anemia Mother   . Sickle cell anemia Father   . Sickle cell trait Brother    History   Social History  . Marital Status: Single    Spouse Name: N/A    Number of Children: 0  . Years of Education: 13   Occupational History  . Unemployed    Social History Main Topics  . Smoking status: Former Smoker -- 13 years    Types: Cigarettes    Quit date: 07/08/2010  . Smokeless tobacco: Never Used  . Alcohol Use: No  . Drug Use: No     quit smoking 2011  . Sexually Active: Yes    Birth Control/ Protection: None   Other Topics Concern  . Not on file   Social History Narrative   Single.  Lives with girlfriend.  Review of Systems: Pertinent items are noted in HPI.  Physical Exam: Blood pressure 127/78, pulse 79, temperature 98.8 F (37.1 C), temperature source Oral, resp. rate 16, SpO2 98.00%. General Appearance: Alert and oriented, cooperative, well developed, well nourished, mild distress  Head: Normocephalic, without obvious abnormality, atraumatic  Eyes: PERRLA, EOMI, the patient has slight strabismus, scleral icterus  Nose: Nares normal, septum midline, mucosa normal, no drainage or sinus tenderness.  Throat: Normal lips, mucosa, tongue and gums  Back: Symmetric, bilateral CVA/flank tenderness, lower back tenderness  Resp: CTA, no wheezes/rales/rhonchi  Cardio: S1, S2 regular, no murmurs/clicks/rubs/gallops  GI: Soft, non-tender, hypoactive bowel sounds, no masses, no organomegaly  Male Genitalia: Deferred, the patient denies priapism  Extremities: Extremities normal, atraumatic, no cyanosis or edema, negative Homans sign, no sign of DVT, no tenderness  Pulses: 2+ and symmetric  Skin: Skin  color, texture, turgor normal, no rashes or lesions, numerous tattoos  Neurologic: Grossly normal, CN II-XII intact, no focal deficits  Psych: Appropriate affect   Lab results: Results for orders placed during the hospital encounter of 10/01/11 (from the past 24 hour(s))  COMPREHENSIVE METABOLIC PANEL     Status: Abnormal   Collection Time   10/01/11 12:40 PM      Component Value Range   Sodium 137  135 - 145 mEq/L   Potassium 2.5 (*) 3.5 - 5.1 mEq/L   Chloride 102  96 - 112 mEq/L   CO2 22  19 - 32 mEq/L   Glucose, Bld 121 (*) 70 - 99 mg/dL   BUN <3 (*) 6 - 23 mg/dL   Creatinine, Ser 2.13 (*) 0.50 - 1.35 mg/dL   Calcium 8.9  8.4 - 08.6 mg/dL   Total Protein 7.5  6.0 - 8.3 g/dL   Albumin 3.9  3.5 - 5.2 g/dL   AST 23  0 - 37 U/L   ALT 19  0 - 53 U/L   Alkaline Phosphatase 94  39 - 117 U/L   Total Bilirubin 2.5 (*) 0.3 - 1.2 mg/dL   GFR calc non Af Amer >90  >90 mL/min   GFR calc Af Amer >90  >90 mL/min  RETICULOCYTES     Status: Abnormal   Collection Time   10/01/11 12:40 PM      Component Value Range   Retic Ct Pct 10.5 (*) 0.4 - 3.1 %   RBC. 3.11 (*) 4.22 - 5.81 MIL/uL   Retic Count, Manual 326.6 (*) 19.0 - 186.0 K/uL  CBC WITH DIFFERENTIAL     Status: Abnormal   Collection Time   10/01/11 12:40 PM      Component Value Range   WBC 19.4 (*) 4.0 - 10.5 K/uL   RBC 3.11 (*) 4.22 - 5.81 MIL/uL   Hemoglobin 9.2 (*) 13.0 - 17.0 g/dL   HCT 57.8 (*) 46.9 - 62.9 %   MCV 87.8  78.0 - 100.0 fL   MCH 29.6  26.0 - 34.0 pg   MCHC 33.7  30.0 - 36.0 g/dL   RDW 52.8 (*) 41.3 - 24.4 %   Platelets 427 (*) 150 - 400 K/uL   Neutrophils Relative 76  43 - 77 %   Lymphocytes Relative 12  12 - 46 %   Monocytes Relative 11  3 - 12 %   Eosinophils Relative 1  0 - 5 %   Basophils Relative 0  0 - 1 %   Neutro Abs 14.8 (*) 1.7 - 7.7 K/uL   Lymphs Abs 2.3  0.7 - 4.0 K/uL   Monocytes Absolute 2.1 (*) 0.1 - 1.0 K/uL   Eosinophils Absolute 0.2  0.0 - 0.7 K/uL   Basophils Absolute 0.0  0.0 - 0.1 K/uL    RBC Morphology TARGET CELLS      Imaging results:  No results found.   Assessment & Plan: Patient Active Hospital Problem List:  Sickle cell crisis : with active hemolysis will tx aggressively with IVF, pain, pruritus , bowel and antimeric management  Hypokalemia: potassium replacement ( 3 runs) repeat labs in AM  Hemochromatosis(hx) start Desferal   [name], [name] 10/01/2011, 2:34 PM

## 2011-10-02 ENCOUNTER — Non-Acute Institutional Stay (HOSPITAL_COMMUNITY): Payer: Medicare Other

## 2011-10-02 DIAGNOSIS — J9819 Other pulmonary collapse: Secondary | ICD-10-CM | POA: Diagnosis not present

## 2011-10-02 LAB — COMPREHENSIVE METABOLIC PANEL
BUN: 4 mg/dL — ABNORMAL LOW (ref 6–23)
Calcium: 8.8 mg/dL (ref 8.4–10.5)
Creatinine, Ser: 0.58 mg/dL (ref 0.50–1.35)
GFR calc Af Amer: >90 mL/min (ref >90)
Glucose, Bld: 98 mg/dL (ref 70–99)
Total Protein: 7 g/dL (ref 6.0–8.3)

## 2011-10-02 LAB — CBC WITH DIFFERENTIAL/PLATELET
Eosinophils Absolute: 0.7 10*3/uL (ref 0.0–0.7)
Eosinophils Relative: 3 % (ref 0–5)
Hemoglobin: 8.8 g/dL — ABNORMAL LOW (ref 13.0–17.0)
Lymphs Abs: 5 10*3/uL — ABNORMAL HIGH (ref 0.7–4.0)
MCH: 29.4 pg (ref 26.0–34.0)
MCV: 87 fL (ref 78.0–100.0)
Monocytes Absolute: 3.3 10*3/uL — ABNORMAL HIGH (ref 0.1–1.0)
Monocytes Relative: 13 % — ABNORMAL HIGH (ref 3–12)
Platelets: 570 10*3/uL — ABNORMAL HIGH (ref 150–400)
RBC: 2.99 MIL/uL — ABNORMAL LOW (ref 4.22–5.81)

## 2011-10-02 LAB — TYPE AND SCREEN: ABO/RH(D): O POS

## 2011-10-02 LAB — URINALYSIS, ROUTINE W REFLEX MICROSCOPIC
Leukocytes, UA: NEGATIVE
Nitrite: NEGATIVE
Protein, ur: NEGATIVE mg/dL
Urobilinogen, UA: 1 mg/dL (ref 0.0–1.0)

## 2011-10-02 MED ORDER — TEMAZEPAM 7.5 MG PO CAPS: 7.5000 mg | ORAL_CAPSULE | Freq: Every evening | ORAL | Status: DC | PRN

## 2011-10-02 MED ORDER — HYDROMORPHONE HCL 4 MG PO TABS: 4.0000 mg | ORAL_TABLET | ORAL | Status: DC | PRN

## 2011-10-02 MED ORDER — POTASSIUM CHLORIDE CRYS ER 20 MEQ PO TBCR
40.0000 meq | EXTENDED_RELEASE_TABLET | Freq: Once | ORAL | Status: AC
  Administered 2011-10-02: 40 meq via ORAL

## 2011-10-02 MED ORDER — DEXTROSE-NACL 5-0.45 % IV SOLN: INTRAVENOUS | Status: DC

## 2011-10-02 MED ORDER — POTASSIUM CHLORIDE CRYS ER 20 MEQ PO TBCR
EXTENDED_RELEASE_TABLET | ORAL | Status: AC
  Filled 2011-10-02: qty 2

## 2011-10-02 NOTE — Discharge Summary (Signed)
[facility] Discharge Summary   Patient ID: [name] MRN: [id number] DOB/AGE: Mar 28, 1980 31 y.o.  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      HYDROmorphone 4 MG tablet   Commonly known as: DILAUDID   Take 1 tablet (4 mg total) by mouth every 4 (four) hours as needed. For pain.      hydroxyurea 500 MG capsule   Commonly known as: HYDREA   Take 500 mg by mouth 2 (two) times daily. May take with food to minimize GI side effects.      ibuprofen 800 MG tablet   Commonly known as: ADVIL,MOTRIN   Take 800 mg by mouth every 8 (eight) hours as needed. For pain.      oxyCODONE 80 MG 12 hr tablet   Commonly known as: OXYCONTIN   Take 1 tablet (80 mg total) by mouth every 12 (twelve) hours.      potassium chloride SA 20 MEQ tablet   Commonly known as: K-DUR,KLOR-CON   Take 1 tablet (20 mEq total) by mouth 2 (two) times daily.      temazepam 7.5 MG capsule   Commonly known as: RESTORIL   Take 1 capsule (7.5 mg total) by mouth at bedtime as needed for sleep.             Consults: None Significant Diagnostic Studies:  Dg Chest 2 View  10/02/2011  *RADIOLOGY REPORT*  Clinical Data: Fever.  Leukocytosis.  Sickle cell crisis.  CHEST - 2 VIEW  Comparison: 09/04/2011.  Findings: Enlarged cardiac silhouette with a mild increase in size. Increased linear density at the lung bases.  Unremarkable bones. Cholecystectomy clips.  IMPRESSION:  1.  Cardiomegaly with  mild progression. 2.  Mild bibasilar atelectasis.   Original Report Authenticated By: Darrol Angel, M.D.    Dg Chest 2 View  09/04/2011  *RADIOLOGY REPORT*  Clinical Data: Sickle cell crisis.  Chest pain.  CHEST - 2 VIEW  Comparison: Chest x-ray 08/21/2011.  Findings: Lung volumes are normal.  There is mild diffuse coarsening of the interstitium, similar to prior examinations (this appears be chronic in this patient).  No definite acute consolidative airspace disease.  No pleural effusions.  No evidence of pulmonary edema.  Heart size is borderline enlarged. Mediastinal contours are unremarkable.  Surgical clips project over the right upper quadrant of the abdomen, consistent with prior cholecystectomy.  IMPRESSION: 1.  No definite radiographic evidence of acute cardiopulmonary disease.  Original Report Authenticated By: Florencia Reasons, M.D.   US Abdomen Complete  09/10/2011  *RADIOLOGY REPORT*  Clinical Data:  Jaundice, elevated bilirubin, acute sickle cell crisis  COMPLETE ABDOMINAL ULTRASOUND  Comparison:  01/11/2011  Findings:  Gallbladder:  Surgically absent.  Common bile duct:  Measures 5 mm.  Liver:  No focal lesion identified.  Within normal limits in parenchymal echogenicity.  IVC:  Appears normal.  Pancreas:  Incompletely visualized but grossly unremarkable.  Spleen:  Not visualized, possibly surgically absent.  Right Kidney:  Enlarged, measuring 15.0 cm.  9 mm hyperechoic lesion in the right upper pole.  No hydronephrosis.  Left Kidney:  Enlarged, measuring 13.8 cm.  No mass or hydronephrosis.  Abdominal aorta:  No aneurysm identified.  IMPRESSION: Status post cholecystectomy.  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  9 mm hyperechoic lesion in the right upper kidney, nonspecific, possibly reflecting focal fat or a small angiomyolipoma.  Otherwise negative abdominal ultrasound.  Original Report Authenticated By: Charline Bills, M.D.   Ir Fluoro Guide Cv Line Right  09/07/2011  * RADIOLOGY REPORT *   Clinical data: Sickle cell anemia, needs venous access for transfusion  PICC PLACEMENT WITH ULTRASOUND AND FLUOROSCOPY  Technique: After written informed consent was obtained, patient was placed in the supine position on angiographic table. Patency of the right brachial vein was confirmed with ultrasound with image documentation. An appropriate skin site was determined. Skin site was marked. Region was prepped using maximum barrier technique including cap and mask, sterile gown, sterile gloves, large sterile sheet, and Chlorhexidine   as cutaneous antisepsis.  The region was infiltrated locally with 1% lidocaine.   Under real-time ultrasound guidance, the right brachial vein was accessed with a 21 gauge micropuncture needle; the needle tip within the vein was confirmed with ultrasound image documentation.   Needle exchanged over a 018 guidewire for a peel-away sheath, through which a 5-French double- lumen power injectable PICC trimmed to 41cm was advanced, positioned with its tip near the cavoatrial junction.  Spot chest radiograph confirms appropriate catheter position.  Catheter was flushed per protocol and secured externally with 0-Prolene sutures. The patient tolerated procedure well, with no immediate complication.  IMPRESSION: Technically successful five [place] double lumen power injectable PICC placement  Original Report Authenticated By: Osa Craver, M.D.   Ir US Guide Vasc Access Right  09/07/2011  * RADIOLOGY REPORT *  Clinical data: Sickle cell anemia, needs venous access for transfusion  PICC PLACEMENT WITH ULTRASOUND AND FLUOROSCOPY  Technique: After written informed consent was obtained, patient was placed in the supine position on angiographic table. Patency of the right brachial vein was confirmed with ultrasound with image documentation. An appropriate skin site was determined. Skin site was marked. Region was prepped using maximum barrier technique including cap and mask, sterile gown,  sterile gloves, large sterile sheet, and Chlorhexidine   as cutaneous antisepsis.  The region was infiltrated locally with 1% lidocaine.   Under real-time ultrasound guidance, the right brachial vein was accessed with a 21 gauge micropuncture needle; the needle tip within the vein was confirmed with ultrasound image documentation.   Needle exchanged over a 018 guidewire for a peel-away sheath, through which a 5-French double- lumen power injectable PICC trimmed to 41cm was advanced, positioned with its tip near the cavoatrial junction.  Spot chest radiograph confirms appropriate catheter position.  Catheter was flushed per protocol and secured externally with 0-Prolene sutures. The patient tolerated procedure well, with no immediate complication.  IMPRESSION: Technically successful five [place] double lumen power injectable PICC placement  Original Report Authenticated By: Osa Craver, M.D.     [facility] Course:  For complete details please refer to admission H and P, but in brief, Mr. Jelani was admitted to [facility] for 23 hr observation for muscle leg cramping, pain not relieved by home medications and requested to be seen. Laboratory data confirmed hypokalemia  replacement with 3 runs  but never reached normal limits prior to d/c he received KCL.  Maintained a low grade fever chest x-ray order and showed cardiomegaly with mild progression and  mild bibasilar atelectasis encouraged home use of IS. U/A pending. WBC also climbed over night.  Other medications received was IVF  Hydration, pain, antiemetic and antipruitics management. He was d/c with a pain rating of 4/10 verbalized able to manage pain at home.   Physical Exam at Discharge:  BP 117/71  Pulse 80  Temp 99 F (37.2 C) (Oral)  Resp 18  SpO2 95% GENERAL: oriented, cooperative, well developed, well nourished, mild distress  Resp: CTA, no wheezes/rales/rhonchi  Cardio: S1, S2 regular, no  murmurs/clicks/rubs/gallops  GI: Soft, non-tender, hypoactive bowel sounds, no masses, no organomegaly  Male Genitalia: Deferred, the patient denies priapism  Extremities: Extremities normal, atraumatic, no cyanosis or edema, negative Homans sign, no sign of DVT, no tenderness  Psych: Appropriate affect   Disposition at Discharge: 01-Home or Self Care  Discharge Orders: Change insomnia medication from Ambien to Restoril, a prescription for  2 week supply of dilaudid given and appointment made for follow up with Dr. [name]  September 9 at 3:45 pm  Condition at Discharge:   Stable  Time spent on Discharge:  Greater than 30 minutes.  Signed: [name], [name] 10/02/2011, 10:35 AM

## 2011-10-03 NOTE — Discharge Summary (Signed)
[facility] Discharge Summary  Patient ID: [name] MRN: [id number] DOB/AGE: 1980/12/21 31 y.o.  Admit date: 09/04/2011 Discharge date: 09/04/2011  Admission Diagnoses: Sickle cell crisis  Discharge Diagnoses:  Sickle cell crisis. This continues. Hypokalemia. Sickle cell lung disease. Avascular necrosis of the shoulders.  Discharged Condition: Improved but still requiring IV pain medication.  Clinic Course: Patient medical unit for further treatment of a sickle cell crisis. He was also noted to be hypokalemic with potassium of 3.0 at of presentation. Patient was placed on IV fluids. He was given IV Dilaudid at 4 mg every 2 hours when necessary pain. His hypokalemia was aggressively repleted as well. Despite these measures patient continued to have significant pain. He is subsequently being discharged from the unit to be admitted to the hospital for further therapy.   Disposition: Patient being admitted to the hospital for further treatment.  Medication list reviewed.      SignedAugust Saucer, Alesa Echevarria 09/04/2011, 7:52 PM

## 2011-10-10 DIAGNOSIS — J301 Allergic rhinitis due to pollen: Secondary | ICD-10-CM | POA: Diagnosis not present

## 2011-10-10 DIAGNOSIS — D5720 Sickle-cell/Hb-C disease without crisis: Secondary | ICD-10-CM | POA: Diagnosis not present

## 2011-10-16 ENCOUNTER — Non-Acute Institutional Stay (HOSPITAL_COMMUNITY)
Admission: AD | Admit: 2011-10-16 | Discharge: 2011-10-17 | Disposition: A | Discharge status: Home / Self Care | Payer: Medicare Other | Attending: Internal Medicine | Admitting: Internal Medicine

## 2011-10-16 ENCOUNTER — Encounter (HOSPITAL_COMMUNITY): Payer: Self-pay

## 2011-10-16 DIAGNOSIS — Z86711 Personal history of pulmonary embolism: Secondary | ICD-10-CM | POA: Diagnosis not present

## 2011-10-16 DIAGNOSIS — D5700 Hb-SS disease with crisis, unspecified: Secondary | ICD-10-CM | POA: Diagnosis not present

## 2011-10-16 DIAGNOSIS — D57819 Other sickle-cell disorders with crisis, unspecified: Secondary | ICD-10-CM | POA: Diagnosis not present

## 2011-10-16 DIAGNOSIS — E876 Hypokalemia: Secondary | ICD-10-CM | POA: Diagnosis not present

## 2011-10-16 DIAGNOSIS — G8929 Other chronic pain: Secondary | ICD-10-CM | POA: Insufficient documentation

## 2011-10-16 DIAGNOSIS — Z79899 Other long term (current) drug therapy: Secondary | ICD-10-CM | POA: Diagnosis not present

## 2011-10-16 DIAGNOSIS — Z86718 Personal history of other venous thrombosis and embolism: Secondary | ICD-10-CM | POA: Diagnosis not present

## 2011-10-16 DIAGNOSIS — M79609 Pain in unspecified limb: Secondary | ICD-10-CM | POA: Diagnosis not present

## 2011-10-16 DIAGNOSIS — R52 Pain, unspecified: Secondary | ICD-10-CM | POA: Insufficient documentation

## 2011-10-16 DIAGNOSIS — Z5181 Encounter for therapeutic drug level monitoring: Secondary | ICD-10-CM | POA: Diagnosis not present

## 2011-10-16 LAB — CBC WITH DIFFERENTIAL/PLATELET
Basophils Absolute: 0.1 10*3/uL (ref 0.0–0.1)
Eosinophils Relative: 2 % (ref 0–5)
Lymphocytes Relative: 17 % (ref 12–46)
MCV: 90.2 fL (ref 78.0–100.0)
Neutro Abs: 13.8 10*3/uL — ABNORMAL HIGH (ref 1.7–7.7)
Neutrophils Relative %: 72 % (ref 43–77)
Platelets: 518 10*3/uL — ABNORMAL HIGH (ref 150–400)
RDW: 17.9 % — ABNORMAL HIGH (ref 11.5–15.5)
WBC: 19.2 10*3/uL — ABNORMAL HIGH (ref 4.0–10.5)

## 2011-10-16 LAB — COMPREHENSIVE METABOLIC PANEL
ALT: 21 U/L (ref 0–53)
Calcium: 8.8 mg/dL (ref 8.4–10.5)
GFR calc Af Amer: >90 mL/min (ref >90)
Glucose, Bld: 116 mg/dL — ABNORMAL HIGH (ref 70–99)
Sodium: 136 mEq/L (ref 135–145)
Total Protein: 7.4 g/dL (ref 6.0–8.3)

## 2011-10-16 LAB — RETICULOCYTES: Retic Ct Pct: 10.8 % — ABNORMAL HIGH (ref 0.4–3.1)

## 2011-10-16 MED ORDER — DEXTROSE-NACL 5-0.45 % IV SOLN
INTRAVENOUS | Status: DC
  Administered 2011-10-16: 10:00:00 via INTRAVENOUS

## 2011-10-16 MED ORDER — DEXTROSE 5 % IV SOLN
2000.0000 mg | Freq: Every day | INTRAVENOUS | Status: DC
  Administered 2011-10-16: 2000 mg via INTRAVENOUS
  Filled 2011-10-16: qty 2

## 2011-10-16 MED ORDER — FOLIC ACID 1 MG PO TABS
1.0000 mg | ORAL_TABLET | Freq: Every day | ORAL | Status: DC
  Administered 2011-10-16 – 2011-10-17 (×2): 1 mg via ORAL
  Filled 2011-10-16 (×2): qty 1

## 2011-10-16 MED ORDER — OXYCODONE HCL 80 MG PO TB12
80.0000 mg | ORAL_TABLET | Freq: Two times a day (BID) | ORAL | Status: DC
  Administered 2011-10-16 – 2011-10-17 (×2): 80 mg via ORAL
  Filled 2011-10-16 (×2): qty 1

## 2011-10-16 MED ORDER — DIPHENHYDRAMINE HCL 50 MG/ML IJ SOLN
12.5000 mg | INTRAMUSCULAR | Status: DC | PRN
  Administered 2011-10-16 (×4): 25 mg via INTRAVENOUS
  Administered 2011-10-17: 50 mg via INTRAVENOUS
  Administered 2011-10-17: 25 mg via INTRAVENOUS
  Filled 2011-10-16 (×6): qty 1

## 2011-10-16 MED ORDER — DEXTROSE-NACL 5-0.45 % IV SOLN
INTRAVENOUS | Status: DC
  Administered 2011-10-16 (×2): via INTRAVENOUS

## 2011-10-16 MED ORDER — HYDROMORPHONE HCL PF 2 MG/ML IJ SOLN
2.0000 mg | INTRAMUSCULAR | Status: DC | PRN
  Administered 2011-10-16 – 2011-10-17 (×10): 4 mg via INTRAVENOUS
  Administered 2011-10-17: 2 mg via INTRAVENOUS
  Filled 2011-10-16: qty 2
  Filled 2011-10-16: qty 1
  Filled 2011-10-16 (×9): qty 2

## 2011-10-16 MED ORDER — DIPHENHYDRAMINE HCL 25 MG PO CAPS: 25.0000 mg | ORAL_CAPSULE | ORAL | Status: DC | PRN

## 2011-10-16 MED ORDER — POTASSIUM CHLORIDE CRYS ER 20 MEQ PO TBCR
40.0000 meq | EXTENDED_RELEASE_TABLET | Freq: Once | ORAL | Status: AC
  Administered 2011-10-16: 40 meq via ORAL
  Filled 2011-10-16: qty 2

## 2011-10-16 MED ORDER — ONDANSETRON HCL 4 MG/2ML IJ SOLN
4.0000 mg | INTRAMUSCULAR | Status: DC | PRN
  Administered 2011-10-16 – 2011-10-17 (×5): 4 mg via INTRAVENOUS
  Filled 2011-10-16 (×6): qty 2

## 2011-10-16 MED ORDER — ONDANSETRON HCL 4 MG PO TABS: 4.0000 mg | ORAL_TABLET | ORAL | Status: DC | PRN

## 2011-10-16 MED ORDER — TEMAZEPAM 7.5 MG PO CAPS: 7.5000 mg | ORAL_CAPSULE | Freq: Every evening | ORAL | Status: DC | PRN

## 2011-10-16 NOTE — Progress Notes (Signed)
Urine specimen for drug monitoring collected

## 2011-10-16 NOTE — H&P (Signed)
[facility] History and Physical   Date: 10/16/2011  Patient name: [name] Medical record number: [id number] Date of birth: 07/02/1980 Age: 30 y.o. Gender: male PCP: [name], MD  Attending physician: Gwenyth Bender, MD  Chief Complaint: Left side and back pain    History of Present Illness:  [name] is a 31 y.o. African American male who was admitted to the sickle cell center for observation on with sickle cell crisis with active hemolysis , hypokalemia back and leg pain which is a typical  Indication of crisis for him.  This pain was not relieved by his home pain meds. Rates pain 7/10.     Meds: Prescriptions prior to admission  Medication Sig Dispense Refill  . folic acid (FOLVITE) 1 MG tablet Take 1 mg by mouth daily.      Marland Kitchen HYDROmorphone (DILAUDID) 4 MG tablet Take 1 tablet (4 mg total) by mouth every 4 (four) hours as needed. For pain.  60 tablet  0  . hydroxyurea (HYDREA) 500 MG capsule Take 500 mg by mouth 2 (two) times daily. May take with food to minimize GI side effects.      Marland Kitchen ibuprofen (ADVIL,MOTRIN) 800 MG tablet Take 800 mg by mouth every 8 (eight) hours as needed. For pain.      Marland Kitchen oxyCODONE (OXYCONTIN) 80 MG 12 hr tablet Take 1 tablet (80 mg total) by mouth every 12 (twelve) hours.  30 tablet  0  . potassium chloride SA (K-DUR,KLOR-CON) 20 MEQ tablet Take 1 tablet (20 mEq total) by mouth 2 (two) times daily.  60 tablet  1  . temazepam (RESTORIL) 7.5 MG capsule Take 1 capsule (7.5 mg total) by mouth at bedtime as needed for sleep.  30 capsule  0    Allergies: Morphine and related Past Medical History  Diagnosis Date  . Sickle cell anemia   . Blood transfusion   . 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 Hypokalemia   . Mood disorder   . Pulmonary embolism   . Avascular necrosis   . Leukocytosis     Chronic  . Thrombocytosis     Chronic   Past Surgical History  Procedure Date  . Right hip  replacement     08/2006  . Cholecystectomy     01/2008  . Porta cath placement   . Porta cath removal   . Umbilical hernia repair     01/2008  . Excision of left periauricular cyst     10/2009  . Excision of right ear lobe cyst with primary closur     11/2007   Family History  Problem Relation Age of Onset  . Sickle cell anemia Mother   . Sickle cell anemia Father   . Sickle cell trait Brother    History   Social History  . Marital Status: Single    Spouse Name: N/A    Number of Children: 0  . Years of Education: 13   Occupational History  . Unemployed    Social History Main Topics  . Smoking status: Former Smoker -- 13 years    Types: Cigarettes    Quit date: 07/08/2010  . Smokeless tobacco: Never Used  . Alcohol Use: No  . Drug Use: No     quit smoking 2011  . Sexually Active: Yes    Birth Control/ Protection: None   Other Topics Concern  . Not on file   Social History  Narrative   Single.  Lives with girlfriend.      Review of Systems: Pertinent items are noted in HPI.  Physical Exam: Blood pressure 127/78, pulse 79, temperature 98.8 F (37.1 C), temperature source Oral, resp. rate 16, SpO2 98.00%. General Appearance: Alert and oriented, cooperative, well developed, well nourished, mild distress  Head: Normocephalic, without obvious abnormality, atraumatic  Eyes: PERRLA, EOMI, the patient has slight strabismus, scleral icterus  Nose: Nares normal, septum midline, mucosa normal, no drainage or sinus tenderness.  Throat: Normal lips, mucosa, tongue and gums  Back: Symmetric, bilateral CVA/flank tenderness, lower back tenderness  Resp: CTA, no wheezes/rales/rhonchi  Cardio: S1, S2 regular, no murmurs/clicks/rubs/gallops  GI: Soft, non-tender, hypoactive bowel sounds, no masses, no organomegaly  Male Genitalia: Deferred, the patient denies priapism  Extremities: Extremities normal, atraumatic, no cyanosis or edema, negative Homans sign, no sign of DVT, no  tenderness  Pulses: 2+ and symmetric  Skin: Skin color, texture, turgor normal, no rashes or lesions, numerous tattoos  Neurologic: Grossly normal, CN II-XII intact, no focal deficits  Psych: Appropriate affect   Lab results: Results for orders placed during the hospital encounter of 10/16/11 (from the past 24 hour(s))  CBC WITH DIFFERENTIAL     Status: Abnormal   Collection Time   10/16/11  1:15 PM      Component Value Range   WBC 19.2 (*) 4.0 - 10.5 K/uL   RBC 2.87 (*) 4.22 - 5.81 MIL/uL   Hemoglobin 8.7 (*) 13.0 - 17.0 g/dL   HCT 40.9 (*) 81.1 - 91.4 %   MCV 90.2  78.0 - 100.0 fL   MCH 30.3  26.0 - 34.0 pg   MCHC 33.6  30.0 - 36.0 g/dL   RDW 78.2 (*) 95.6 - 21.3 %   Platelets 518 (*) 150 - 400 K/uL   Neutrophils Relative 72  43 - 77 %   Neutro Abs 13.8 (*) 1.7 - 7.7 K/uL   Lymphocytes Relative 17  12 - 46 %   Lymphs Abs 3.2  0.7 - 4.0 K/uL   Monocytes Relative 8  3 - 12 %   Monocytes Absolute 1.6 (*) 0.1 - 1.0 K/uL   Eosinophils Relative 2  0 - 5 %   Eosinophils Absolute 0.5  0.0 - 0.7 K/uL   Basophils Relative 1  0 - 1 %   Basophils Absolute 0.1  0.0 - 0.1 K/uL  RETICULOCYTES     Status: Abnormal   Collection Time   10/16/11  1:15 PM      Component Value Range   Retic Ct Pct 10.8 (*) 0.4 - 3.1 %   RBC. 2.87 (*) 4.22 - 5.81 MIL/uL   Retic Count, Manual 310.0 (*) 19.0 - 186.0 K/uL  COMPREHENSIVE METABOLIC PANEL     Status: Abnormal   Collection Time   10/16/11  1:15 PM      Component Value Range   Sodium 136  135 - 145 mEq/L   Potassium 3.1 (*) 3.5 - 5.1 mEq/L   Chloride 101  96 - 112 mEq/L   CO2 23  19 - 32 mEq/L   Glucose, Bld 116 (*) 70 - 99 mg/dL   BUN 3 (*) 6 - 23 mg/dL   Creatinine, Ser 0.86  0.50 - 1.35 mg/dL   Calcium 8.8  8.4 - 57.8 mg/dL   Total Protein 7.4  6.0 - 8.3 g/dL   Albumin 4.0  3.5 - 5.2 g/dL   AST 31  0 - 37 U/L  ALT 21  0 - 53 U/L   Alkaline Phosphatase 91  39 - 117 U/L   Total Bilirubin 3.9 (*) 0.3 - 1.2 mg/dL   GFR calc non Af Amer >90   >90 mL/min   GFR calc Af Amer >90  >90 mL/min    Imaging results:  No results found.   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

## 2011-10-17 LAB — CBC WITH DIFFERENTIAL/PLATELET
HCT: 25.6 % — ABNORMAL LOW (ref 39.0–52.0)
Hemoglobin: 8.7 g/dL — ABNORMAL LOW (ref 13.0–17.0)
Lymphocytes Relative: 22 % (ref 12–46)
Lymphs Abs: 4.5 10*3/uL — ABNORMAL HIGH (ref 0.7–4.0)
Monocytes Absolute: 2.4 10*3/uL — ABNORMAL HIGH (ref 0.1–1.0)
Monocytes Relative: 12 % (ref 3–12)
Neutro Abs: 13.2 10*3/uL — ABNORMAL HIGH (ref 1.7–7.7)
WBC: 20.8 10*3/uL — ABNORMAL HIGH (ref 4.0–10.5)

## 2011-10-17 LAB — COMPREHENSIVE METABOLIC PANEL
BUN: 3 mg/dL — ABNORMAL LOW (ref 6–23)
CO2: 22 mEq/L (ref 19–32)
Chloride: 102 mEq/L (ref 96–112)
Creatinine, Ser: 0.61 mg/dL (ref 0.50–1.35)
GFR calc non Af Amer: >90 mL/min (ref >90)
Total Bilirubin: 4.1 mg/dL — ABNORMAL HIGH (ref 0.3–1.2)

## 2011-10-17 MED ORDER — TEMAZEPAM 7.5 MG PO CAPS: 7.5000 mg | ORAL_CAPSULE | Freq: Every evening | ORAL | Status: DC | PRN

## 2011-10-17 MED ORDER — HYDROMORPHONE HCL 4 MG PO TABS: 4.0000 mg | ORAL_TABLET | ORAL | Status: DC | PRN

## 2011-10-17 MED ORDER — POTASSIUM CHLORIDE CRYS ER 20 MEQ PO TBCR
40.0000 meq | EXTENDED_RELEASE_TABLET | Freq: Once | ORAL | Status: AC
  Administered 2011-10-17: 40 meq via ORAL
  Filled 2011-10-17: qty 2

## 2011-10-17 MED ORDER — OXYCODONE HCL 80 MG PO TB12: 80.0000 mg | ORAL_TABLET | Freq: Two times a day (BID) | ORAL | Status: DC

## 2011-10-17 NOTE — Progress Notes (Signed)
Patient ID: [name], male   DOB: 1980/02/18, 31 y.o.   MRN: [id number] Patient d/c from sickle cell unit. No complications, vitals stable, patient ambulatory from unit, discharge orders given and follow-up appt made.

## 2011-10-17 NOTE — Discharge Summary (Signed)
[facility] Discharge Summary   Patient ID: [name] MRN: [id number] DOB/AGE: [birth year] 31 y.o.  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      HYDROmorphone 4 MG tablet   Commonly known as: DILAUDID   Take 1 tablet (4 mg total) by mouth every 4 (four) hours as needed. For pain.      hydroxyurea 500 MG capsule   Commonly known as: HYDREA   Take 500 mg by mouth 2 (two) times daily. May take with food to minimize GI side effects.      ibuprofen 800 MG tablet   Commonly known as: ADVIL,MOTRIN   Take 800 mg by mouth every 8 (eight) hours as needed. For pain.      oxyCODONE 80 MG 12 hr tablet   Commonly known as: OXYCONTIN   Take 1 tablet (80 mg total) by mouth every 12 (twelve) hours.      potassium chloride SA 20 MEQ tablet   Commonly known as: K-DUR,KLOR-CON   Take 1 tablet (20 mEq total) by mouth 2 (two) times daily.      temazepam 7.5 MG capsule   Commonly known as: RESTORIL   Take 1 capsule (7.5 mg total) by mouth at bedtime as needed for sleep.          Consults: None Significant Diagnostic Studies:  Dg Chest 2 View  10/02/2011  *RADIOLOGY REPORT*  Clinical Data: Fever.  Leukocytosis.  Sickle cell crisis.  CHEST - 2 VIEW  Comparison: 09/04/2011.  Findings: Enlarged cardiac silhouette with a mild increase in size. Increased linear density at the lung bases.  Unremarkable bones. Cholecystectomy clips.  IMPRESSION:  1.  Cardiomegaly with mild progression. 2.  Mild bibasilar atelectasis.    Original Report Authenticated By: Darrol Angel, M.D.    Dg Chest 2 View  09/04/2011  *RADIOLOGY REPORT*  Clinical Data: Sickle cell crisis.  Chest pain.  CHEST - 2 VIEW  Comparison: Chest x-ray 08/21/2011.  Findings: Lung volumes are normal.  There is mild diffuse coarsening of the interstitium, similar to prior examinations (this appears be chronic in this patient).  No definite acute consolidative airspace disease.  No pleural effusions.  No evidence of pulmonary edema.  Heart size is borderline enlarged. Mediastinal contours are unremarkable.  Surgical clips project over the right upper quadrant of the abdomen, consistent with prior cholecystectomy.  IMPRESSION: 1.  No definite radiographic evidence of acute cardiopulmonary disease.  Original Report Authenticated By: Florencia Reasons, M.D.   US Abdomen Complete  09/10/2011  *RADIOLOGY REPORT*  Clinical Data:  Jaundice, elevated bilirubin, acute sickle cell crisis  COMPLETE ABDOMINAL ULTRASOUND  Comparison:  01/11/2011  Findings:  Gallbladder:  Surgically absent.  Common bile duct:  Measures 5 mm.  Liver:  No focal lesion identified.  Within normal limits in parenchymal echogenicity.  IVC:  Appears normal.  Pancreas:  Incompletely visualized but grossly unremarkable.  Spleen:  Not visualized, possibly  surgically absent.  Right Kidney:  Enlarged, measuring 15.0 cm.  9 mm hyperechoic lesion in the right upper pole.  No hydronephrosis.  Left Kidney:  Enlarged, measuring 13.8 cm.  No mass or hydronephrosis.  Abdominal aorta:  No aneurysm identified.  IMPRESSION: Status post cholecystectomy.  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  9 mm hyperechoic lesion in the right upper kidney, nonspecific, possibly reflecting focal fat or a small angiomyolipoma.  Otherwise negative abdominal ultrasound.  Original Report Authenticated By: Charline Bills, M.D.   Ir Fluoro Guide Cv Line Right  09/07/2011  * RADIOLOGY REPORT *  Clinical data: Sickle cell anemia, needs venous  access for transfusion  PICC PLACEMENT WITH ULTRASOUND AND FLUOROSCOPY  Technique: After written informed consent was obtained, patient was placed in the supine position on angiographic table. Patency of the right brachial vein was confirmed with ultrasound with image documentation. An appropriate skin site was determined. Skin site was marked. Region was prepped using maximum barrier technique including cap and mask, sterile gown, sterile gloves, large sterile sheet, and Chlorhexidine   as cutaneous antisepsis.  The region was infiltrated locally with 1% lidocaine.   Under real-time ultrasound guidance, the right brachial vein was accessed with a 21 gauge micropuncture needle; the needle tip within the vein was confirmed with ultrasound image documentation.   Needle exchanged over a 018 guidewire for a peel-away sheath, through which a 5-French double- lumen power injectable PICC trimmed to 41cm was advanced, positioned with its tip near the cavoatrial junction.  Spot chest radiograph confirms appropriate catheter position.  Catheter was flushed per protocol and secured externally with 0-Prolene sutures. The patient tolerated procedure well, with no immediate complication.  IMPRESSION: Technically successful five [place] double lumen power injectable PICC placement  Original Report Authenticated By: Osa Craver, M.D.   Ir US Guide Vasc Access Right  09/07/2011  * RADIOLOGY REPORT *  Clinical data: Sickle cell anemia, needs venous access for transfusion  PICC PLACEMENT WITH ULTRASOUND AND FLUOROSCOPY  Technique: After written informed consent was obtained, patient was placed in the supine position on angiographic table. Patency of the right brachial vein was confirmed with ultrasound with image documentation. An appropriate skin site was determined. Skin site was marked. Region was prepped using maximum barrier technique including cap and mask, sterile gown, sterile gloves, large sterile sheet, and  Chlorhexidine   as cutaneous antisepsis.  The region was infiltrated locally with 1% lidocaine.   Under real-time ultrasound guidance, the right brachial vein was accessed with a 21 gauge micropuncture needle; the needle tip within the vein was confirmed with ultrasound image documentation.   Needle exchanged over a 018 guidewire for a peel-away sheath, through which a 5-French double- lumen power injectable PICC trimmed to 41cm was advanced, positioned with its tip near the cavoatrial junction.  Spot chest radiograph confirms appropriate catheter position.  Catheter was flushed per protocol and secured externally with 0-Prolene sutures. The patient tolerated procedure well, with no immediate complication.  IMPRESSION: Technically successful five [place] double lumen power injectable PICC placement  Original Report Authenticated By: Osa Craver, M.D.     [facility] Course:  For complete details please refer to admission H and P, but in brief, Mr. Aidrian was admitted to [facility] for 23 hr observation for left side and back pain not relieved by home medications and requested to be seen. Laboratory data confirmed hypokalemia replacement given PO 40 meq K+ x's 1 but never reached normal limits prior to  d/c he received KCL. Hx of hemochromatosis received a dose of Desferal IV last night.  Other medications received was IVF  Hydration, pain, antiemetic and antipruitics management. He was d/c with a pain rating of 6/10 verbalized able to manage pain at home.   Physical Exam at Discharge:  BP 119/76  Pulse 82  Temp 98.1 F (36.7 C) (Oral)  Resp 16  SpO2 96% GENERAL: oriented, cooperative, well developed, well nourished, mild distress  Resp: CTA, no wheezes/rales/rhonchi  Cardio: S1, S2 regular, no murmurs/clicks/rubs/gallops  GI: Soft, non-tender, hypoactive bowel sounds, no masses, no organomegaly  Male Genitalia: Deferred, the patient denies priapism  Extremities:  Extremities normal, atraumatic, no cyanosis or edema, negative Homans sign, no sign of DVT, no tenderness  Psych: Appropriate affect   Disposition at Discharge: 01-Home and Self Care  Discharge Orders: Medication prescription for  1 month supply of dilaudid 4mg  Q 4hrs disp # 120  , Oxycontin 80mg  Q 12hrs  Disp # 60 and  Temazepam 7.5mg   QHS prn # 30  appointment  Previously made for follow up with Dr. [name]  On November 08, 2011.  Condition at Discharge:   Stable  Time spent on Discharge:  Greater than 30 minutes.  Signed: Rohail Klees P 10/17/2011, 8:23 AM

## 2011-11-03 ENCOUNTER — Inpatient Hospital Stay (HOSPITAL_COMMUNITY)
Admission: AD | Admit: 2011-11-03 | Discharge: 2011-11-07 | DRG: 812 | Disposition: A | Discharge status: Other Acute Inpatient Hospital | Payer: Medicare Other | Attending: Internal Medicine | Admitting: Internal Medicine

## 2011-11-03 ENCOUNTER — Encounter (HOSPITAL_COMMUNITY): Payer: Self-pay | Admitting: Hematology

## 2011-11-03 ENCOUNTER — Non-Acute Institutional Stay (HOSPITAL_COMMUNITY): Payer: Medicare Other

## 2011-11-03 ENCOUNTER — Telehealth (HOSPITAL_COMMUNITY): Payer: Self-pay | Admitting: Hematology

## 2011-11-03 DIAGNOSIS — Z79899 Other long term (current) drug therapy: Secondary | ICD-10-CM | POA: Diagnosis not present

## 2011-11-03 DIAGNOSIS — G4700 Insomnia, unspecified: Secondary | ICD-10-CM | POA: Diagnosis present

## 2011-11-03 DIAGNOSIS — R059 Cough, unspecified: Secondary | ICD-10-CM | POA: Diagnosis present

## 2011-11-03 DIAGNOSIS — Z86711 Personal history of pulmonary embolism: Secondary | ICD-10-CM | POA: Diagnosis not present

## 2011-11-03 DIAGNOSIS — E876 Hypokalemia: Secondary | ICD-10-CM | POA: Diagnosis present

## 2011-11-03 DIAGNOSIS — F39 Unspecified mood [affective] disorder: Secondary | ICD-10-CM | POA: Diagnosis present

## 2011-11-03 DIAGNOSIS — R05 Cough: Secondary | ICD-10-CM | POA: Diagnosis present

## 2011-11-03 DIAGNOSIS — G8929 Other chronic pain: Secondary | ICD-10-CM | POA: Diagnosis present

## 2011-11-03 DIAGNOSIS — R52 Pain, unspecified: Secondary | ICD-10-CM | POA: Diagnosis present

## 2011-11-03 DIAGNOSIS — Z87891 Personal history of nicotine dependence: Secondary | ICD-10-CM | POA: Diagnosis not present

## 2011-11-03 DIAGNOSIS — M87029 Idiopathic aseptic necrosis of unspecified humerus: Secondary | ICD-10-CM | POA: Diagnosis present

## 2011-11-03 DIAGNOSIS — D5700 Hb-SS disease with crisis, unspecified: Principal | ICD-10-CM | POA: Diagnosis present

## 2011-11-03 DIAGNOSIS — D571 Sickle-cell disease without crisis: Secondary | ICD-10-CM | POA: Diagnosis not present

## 2011-11-03 DIAGNOSIS — R7401 Elevation of levels of liver transaminase levels: Secondary | ICD-10-CM | POA: Diagnosis not present

## 2011-11-03 DIAGNOSIS — M2550 Pain in unspecified joint: Secondary | ICD-10-CM | POA: Diagnosis not present

## 2011-11-03 DIAGNOSIS — D7289 Other specified disorders of white blood cells: Secondary | ICD-10-CM | POA: Diagnosis not present

## 2011-11-03 DIAGNOSIS — D72829 Elevated white blood cell count, unspecified: Secondary | ICD-10-CM | POA: Diagnosis present

## 2011-11-03 DIAGNOSIS — R112 Nausea with vomiting, unspecified: Secondary | ICD-10-CM | POA: Diagnosis not present

## 2011-11-03 DIAGNOSIS — Z452 Encounter for adjustment and management of vascular access device: Secondary | ICD-10-CM | POA: Diagnosis not present

## 2011-11-03 DIAGNOSIS — D5701 Hb-SS disease with acute chest syndrome: Secondary | ICD-10-CM

## 2011-11-03 LAB — CBC WITH DIFFERENTIAL/PLATELET
Basophils Relative: 1 % (ref 0–1)
Eosinophils Absolute: 0.4 10*3/uL (ref 0.0–0.7)
Eosinophils Relative: 2 % (ref 0–5)
Lymphs Abs: 2.8 10*3/uL (ref 0.7–4.0)
MCH: 30.6 pg (ref 26.0–34.0)
MCHC: 34.3 g/dL (ref 30.0–36.0)
MCV: 89.3 fL (ref 78.0–100.0)
Monocytes Relative: 12 % (ref 3–12)
Platelets: 375 10*3/uL (ref 150–400)
RBC: 2.81 MIL/uL — ABNORMAL LOW (ref 4.22–5.81)

## 2011-11-03 LAB — COMPREHENSIVE METABOLIC PANEL
Albumin: 4.3 g/dL (ref 3.5–5.2)
Alkaline Phosphatase: 92 U/L (ref 39–117)
BUN: 3 mg/dL — ABNORMAL LOW (ref 6–23)
CO2: 22 mEq/L (ref 19–32)
Chloride: 103 mEq/L (ref 96–112)
Creatinine, Ser: 0.63 mg/dL (ref 0.50–1.35)
GFR calc non Af Amer: >90 mL/min (ref >90)
Glucose, Bld: 93 mg/dL (ref 70–99)
Potassium: 3.1 mEq/L — ABNORMAL LOW (ref 3.5–5.1)
Total Bilirubin: 4.8 mg/dL — ABNORMAL HIGH (ref 0.3–1.2)

## 2011-11-03 LAB — RETICULOCYTES: Retic Ct Pct: 14.9 % — ABNORMAL HIGH (ref 0.4–3.1)

## 2011-11-03 MED ORDER — ONDANSETRON HCL 4 MG/2ML IJ SOLN
4.0000 mg | INTRAMUSCULAR | Status: DC | PRN
Start: 2011-11-03 — End: 2011-11-09
  Administered 2011-11-03 – 2011-11-08 (×29): 4 mg via INTRAVENOUS
  Filled 2011-11-03 (×29): qty 2

## 2011-11-03 MED ORDER — OXYCODONE HCL 80 MG PO TB12
80.0000 mg | ORAL_TABLET | Freq: Two times a day (BID) | ORAL | Status: DC
  Administered 2011-11-03 – 2011-11-08 (×11): 80 mg via ORAL
  Filled 2011-11-03: qty 2
  Filled 2011-11-03 (×2): qty 1
  Filled 2011-11-03 (×4): qty 2
  Filled 2011-11-03: qty 1
  Filled 2011-11-03 (×2): qty 2
  Filled 2011-11-03: qty 1

## 2011-11-03 MED ORDER — IBUPROFEN 800 MG PO TABS: 800.0000 mg | ORAL_TABLET | Freq: Three times a day (TID) | ORAL | Status: DC | PRN

## 2011-11-03 MED ORDER — ONDANSETRON HCL 4 MG PO TABS: 4.0000 mg | ORAL_TABLET | ORAL | Status: DC | PRN

## 2011-11-03 MED ORDER — DEFEROXAMINE MESYLATE 2 G IJ SOLR
2000.0000 mg | Freq: Every day | INTRAMUSCULAR | Status: DC
  Administered 2011-11-03 – 2011-11-07 (×5): 2000 mg via INTRAVENOUS
  Filled 2011-11-03 (×7): qty 2

## 2011-11-03 MED ORDER — HYDROMORPHONE HCL PF 2 MG/ML IJ SOLN
2.0000 mg | INTRAMUSCULAR | Status: DC | PRN
  Administered 2011-11-03: 4 mg via INTRAVENOUS
  Administered 2011-11-03: 2 mg via INTRAVENOUS
  Administered 2011-11-03 – 2011-11-04 (×6): 4 mg via INTRAVENOUS
  Administered 2011-11-04: 3 mg via INTRAVENOUS
  Administered 2011-11-04 (×4): 4 mg via INTRAVENOUS
  Administered 2011-11-04: 3 mg via INTRAVENOUS
  Administered 2011-11-04 – 2011-11-08 (×37): 4 mg via INTRAVENOUS
  Administered 2011-11-08 (×2): 2 mg via INTRAVENOUS
  Administered 2011-11-08 (×5): 3 mg via INTRAVENOUS
  Filled 2011-11-03 (×7): qty 2
  Filled 2011-11-03: qty 1
  Filled 2011-11-03 (×30): qty 2
  Filled 2011-11-03: qty 1
  Filled 2011-11-03 (×19): qty 2

## 2011-11-03 MED ORDER — FOLIC ACID 1 MG PO TABS: 1.0000 mg | ORAL_TABLET | Freq: Every day | ORAL | Status: DC

## 2011-11-03 MED ORDER — HYDROXYUREA 500 MG PO CAPS
500.0000 mg | ORAL_CAPSULE | Freq: Two times a day (BID) | ORAL | Status: DC
  Administered 2011-11-03 – 2011-11-08 (×11): 500 mg via ORAL
  Filled 2011-11-03 (×11): qty 1

## 2011-11-03 MED ORDER — DEXTROSE-NACL 5-0.45 % IV SOLN
INTRAVENOUS | Status: DC
  Administered 2011-11-03 – 2011-11-07 (×10): via INTRAVENOUS
  Administered 2011-11-07: 1000 mL via INTRAVENOUS
  Administered 2011-11-08 (×2): via INTRAVENOUS

## 2011-11-03 MED ORDER — FOLIC ACID 1 MG PO TABS
1.0000 mg | ORAL_TABLET | Freq: Every day | ORAL | Status: DC
  Administered 2011-11-04 – 2011-11-08 (×5): 1 mg via ORAL
  Filled 2011-11-03 (×5): qty 1

## 2011-11-03 MED ORDER — POTASSIUM CHLORIDE CRYS ER 20 MEQ PO TBCR
20.0000 meq | EXTENDED_RELEASE_TABLET | Freq: Two times a day (BID) | ORAL | Status: DC
  Administered 2011-11-03 – 2011-11-08 (×11): 20 meq via ORAL
  Filled 2011-11-03 (×13): qty 1

## 2011-11-03 MED ORDER — DIPHENHYDRAMINE HCL 50 MG/ML IJ SOLN
12.5000 mg | INTRAMUSCULAR | Status: DC | PRN
  Administered 2011-11-03 – 2011-11-07 (×20): 25 mg via INTRAVENOUS
  Administered 2011-11-07: 12.5 mg via INTRAVENOUS
  Administered 2011-11-07 – 2011-11-08 (×6): 25 mg via INTRAVENOUS
  Filled 2011-11-03 (×27): qty 1

## 2011-11-03 MED ORDER — DIPHENHYDRAMINE HCL 25 MG PO CAPS
25.0000 mg | ORAL_CAPSULE | ORAL | Status: DC | PRN
  Administered 2011-11-04: 50 mg via ORAL
  Administered 2011-11-04: 25 mg via ORAL
  Filled 2011-11-03: qty 1
  Filled 2011-11-03 (×2): qty 2

## 2011-11-03 MED ORDER — TEMAZEPAM 7.5 MG PO CAPS
7.5000 mg | ORAL_CAPSULE | Freq: Every evening | ORAL | Status: DC | PRN
  Administered 2011-11-05 – 2011-11-06 (×2): 7.5 mg via ORAL
  Filled 2011-11-03 (×2): qty 1

## 2011-11-03 NOTE — H&P (Signed)
[facility] History and Physical   Date: 11/03/2011  Patient name: [name] Medical record number: [id number] Date of birth: November 09, 1980 Age: 31 y.o. Gender: male PCP: [name], MD  Attending physician: Gwenyth Bender, MD  Chief Complaint: Sickle Cell Pain, possible Crisis; Nausea, Vomiting  History of Present Illness: [name] is a 31 y.o. African American male with SS Sickle Cell Disease who presented to the [facility] today with complaints nausea, vomiting, back pain, bilateral rib pain, and diffuse bilateral upper quadrant abdominal pain since Monday this week that did not improve with his oral pain medications. He reports having nausea and vomiting only early in the mornings with abdominal pain immediately following and resolving spontaneously. This pain is not associated with abdominal cramping or diarrhea. He reports all of his pain to be constant, sharp and rated 7-8/10. Of note, he was recently admitted for 23 hr observation on 10/16/11 with evidence active hemolysis and discharged able to manage his pain at home.  He currently denies fever, chills, chest pain, shortness of breath, LUTS, or priapism. He has evidence ongoing active hemolysis with initial labs (^ Retic, and T. Bili 4.8) and will be admitted for 23 hours observation for further evaluation and treatment of Sickle Cell Crisis.     Meds: Prescriptions prior to admission  Medication Sig Dispense Refill  . folic acid (FOLVITE) 1 MG tablet Take 1 mg by mouth daily.      Marland Kitchen HYDROmorphone (DILAUDID) 4 MG tablet Take 1 tablet (4 mg total) by mouth every 4 (four) hours as needed. For pain.  120 tablet  0  . hydroxyurea (HYDREA) 500 MG capsule Take 500 mg by mouth 2 (two) times daily. May take with food to minimize GI side effects.      Marland Kitchen ibuprofen (ADVIL,MOTRIN) 800 MG tablet Take 800 mg by mouth every 8 (eight) hours as needed. For pain.      Marland Kitchen oxyCODONE (OXYCONTIN) 80 MG 12 hr tablet Take 1  tablet (80 mg total) by mouth every 12 (twelve) hours.  60 tablet  0  . potassium chloride SA (K-DUR,KLOR-CON) 20 MEQ tablet Take 1 tablet (20 mEq total) by mouth 2 (two) times daily.  60 tablet  1  . temazepam (RESTORIL) 7.5 MG capsule Take 1 capsule (7.5 mg total) by mouth at bedtime as needed for sleep.  30 capsule  0    Allergies: Morphine and related Past Medical History  Diagnosis Date  . Sickle cell anemia   . Blood transfusion   . 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 Hypokalemia   . Mood disorder   . Pulmonary embolism   . Avascular necrosis   . Leukocytosis     Chronic  . Thrombocytosis     Chronic   Past Surgical History  Procedure Date  . Right hip replacement     08/2006  . Cholecystectomy     01/2008  . Porta cath placement   . Porta cath removal   . Umbilical hernia repair     01/2008  . Excision of left periauricular cyst     10/2009  . Excision of right ear lobe cyst with primary closur     11/2007   Family History  Problem Relation Age of Onset  . Sickle cell anemia Mother   . Sickle cell anemia Father   . Sickle cell trait Brother    History   Social History  . Marital  Status: Single    Spouse Name: N/A    Number of Children: 0  . Years of Education: 13   Occupational History  . Unemployed    Social History Main Topics  . Smoking status: Former Smoker -- 13 years    Types: Cigarettes    Quit date: 07/08/2010  . Smokeless tobacco: Never Used  . Alcohol Use: No  . Drug Use: No     quit smoking 2011  . Sexually Active: Yes    Birth Control/ Protection: None   Other Topics Concern  . Not on file   Social History Narrative   Single.  Lives with girlfriend.      Review of Systems: As noted in the HPI  Physical Exam: Blood pressure 106/63, pulse 73, temperature 98.9 F (37.2 C), temperature source Oral, resp. rate 18, height 6' (1.829 m), weight 77.111 kg (170 lb), SpO2 96.00%.  General Appearance:  Alert and oriented, cooperative, well developed, well nourished, mild distress  Head: Normocephalic, without obvious abnormality, atraumatic  Eyes: PERRL, slight strabismus, mild icteric sclera  Nose: Nares normal, septum midline, mucosa normal, no drainage or sinus tenderness.  Throat: Normal lips, mucosa, tongue and gums  Back: Symmetric, mild CVA/flank tenderness, lower back tenderness  Resp: even and nonlabored, no wheezes/rales/rhonchi  Cardio: S1, S2 regular, no murmurs/clicks/rubs/gallops  GI: Soft, ND, Diffuse upper abdominal tenderness, active bowel sounds, no masses, no organomegaly appreciated  Male Genitalia: Deferred, the patient denies priapism  Extremities: Extremities normal, atraumatic, no cyanosis or edema, negative Homans sign, no sign of DVT, no tenderness  Pulses: 2+ and symmetric  Skin: Skin color, texture, turgor normal, no rashes or lesions, numerous tattoos  Neurologic: Grossly normal, CN II-XII intact, no focal deficits  Psych: Appropriate affect    Lab results: Results for orders placed during the hospital encounter of 11/03/11 (from the past 24 hour(s))  COMPREHENSIVE METABOLIC PANEL     Status: Abnormal   Collection Time   11/03/11  2:40 PM      Component Value Range   Sodium 137  135 - 145 mEq/L   Potassium 3.1 (*) 3.5 - 5.1 mEq/L   Chloride 103  96 - 112 mEq/L   CO2 22  19 - 32 mEq/L   Glucose, Bld 93  70 - 99 mg/dL   BUN 3 (*) 6 - 23 mg/dL   Creatinine, Ser 0.45  0.50 - 1.35 mg/dL   Calcium 9.1  8.4 - 40.9 mg/dL   Total Protein 7.8  6.0 - 8.3 g/dL   Albumin 4.3  3.5 - 5.2 g/dL   AST 41 (*) 0 - 37 U/L   ALT 28  0 - 53 U/L   Alkaline Phosphatase 92  39 - 117 U/L   Total Bilirubin 4.8 (*) 0.3 - 1.2 mg/dL   GFR calc non Af Amer >90  >90 mL/min   GFR calc Af Amer >90  >90 mL/min  RETICULOCYTES     Status: Abnormal   Collection Time   11/03/11  2:40 PM      Component Value Range   Retic Ct Pct 14.9 (*) 0.4 - 3.1 %   RBC. 2.81 (*) 4.22 - 5.81 MIL/uL     Retic Count, Manual 418.7 (*) 19.0 - 186.0 K/uL  CBC WITH DIFFERENTIAL     Status: Abnormal   Collection Time   11/03/11  2:40 PM      Component Value Range   WBC 18.4 (*) 4.0 - 10.5 K/uL  RBC 2.81 (*) 4.22 - 5.81 MIL/uL   Hemoglobin 8.6 (*) 13.0 - 17.0 g/dL   HCT 62.1 (*) 30.8 - 65.7 %   MCV 89.3  78.0 - 100.0 fL   MCH 30.6  26.0 - 34.0 pg   MCHC 34.3  30.0 - 36.0 g/dL   RDW 84.6 (*) 96.2 - 95.2 %   Platelets 375  150 - 400 K/uL   Neutrophils Relative 70  43 - 77 %   Neutro Abs 12.9 (*) 1.7 - 7.7 K/uL   Lymphocytes Relative 15  12 - 46 %   Lymphs Abs 2.8  0.7 - 4.0 K/uL   Monocytes Relative 12  3 - 12 %   Monocytes Absolute 2.3 (*) 0.1 - 1.0 K/uL   Eosinophils Relative 2  0 - 5 %   Eosinophils Absolute 0.4  0.0 - 0.7 K/uL   Basophils Relative 1  0 - 1 %   Basophils Absolute 0.1  0.0 - 0.1 K/uL    Imaging results:  No results found.  Assessment & Plan: 1) Sickle cell crisis- SS Disease with Retic ^ 14.9 (prev 10.8 on 9/15) and T. Bili ^ 4.8 (prev 4.1) and evidence active hemolysis on adm. Give gentle IV hydration, IV Dilaudid for severe pain; prn antiemetic, and anti-pruritic. Most recent Hgb electrophoresis on 07/19/11 with Hgb A 47.1 (s/p transfusions), Hgb F 3.8%, and Hgb S 46.0%; continue daily Hydrea and Folic Acid; repeat Hgb electrophoresis in am; If admitted, will need to add Lovenox DVT prophylaxis 2) Leukocytosis- WBC 18.4; (previous WBC 20.8 on 10/17/11); currently afebrile; Will get CXR; Bld Cx and urine Cx during the night if temp > 101.5; repeat labs in am  3) Abdominal Pain- nonfocal and diffuse, occuring only in early am with PMH s/p cholecystectomy and umbilical hermia repair. If persists, Abdominal ultrasound. 4) Acute/Chronic pain syndrome: resume home long-acting Oxycontin BID, and prn Motrin; use IV Dilaudid severe breakthrough pain  5) Anemia of SCD- Hgb 8.6, re-evaluate in am following IV hydration  6) Secondary Hemochromatosis- most recent Ferritin 2055 on  09/06/11; not on po chelation therapy at home; give IV Desferal in am; repeat Ferritin in am to re-evaluate the need to continue therapy 7) Hypokalemia- K 3.1; chronic low on po supplements BID; resume home meds and follow  8) Disposition- discharge after 23 hours observation if able to manage his pain at home vs. Admit inpatient for additional management sickle cell pain with crisis   Lizbeth Bark 11/03/2011, 5:48 PM

## 2011-11-04 ENCOUNTER — Encounter (HOSPITAL_COMMUNITY): Payer: Self-pay

## 2011-11-04 DIAGNOSIS — G8929 Other chronic pain: Secondary | ICD-10-CM | POA: Diagnosis not present

## 2011-11-04 DIAGNOSIS — R52 Pain, unspecified: Secondary | ICD-10-CM | POA: Diagnosis not present

## 2011-11-04 DIAGNOSIS — M87029 Idiopathic aseptic necrosis of unspecified humerus: Secondary | ICD-10-CM | POA: Diagnosis not present

## 2011-11-04 DIAGNOSIS — D5700 Hb-SS disease with crisis, unspecified: Secondary | ICD-10-CM | POA: Diagnosis not present

## 2011-11-04 DIAGNOSIS — D72829 Elevated white blood cell count, unspecified: Secondary | ICD-10-CM | POA: Diagnosis not present

## 2011-11-04 LAB — CBC WITH DIFFERENTIAL/PLATELET
Basophils Absolute: 0.2 10*3/uL — ABNORMAL HIGH (ref 0.0–0.1)
Basophils Relative: 1 % (ref 0–1)
Eosinophils Absolute: 0.8 10*3/uL — ABNORMAL HIGH (ref 0.0–0.7)
Eosinophils Relative: 4 % (ref 0–5)
HCT: 22 % — ABNORMAL LOW (ref 39.0–52.0)
Hemoglobin: 7.9 g/dL — ABNORMAL LOW (ref 13.0–17.0)
Lymphocytes Relative: 22 % (ref 12–46)
Lymphs Abs: 4.2 10*3/uL — ABNORMAL HIGH (ref 0.7–4.0)
MCH: 31.7 pg (ref 26.0–34.0)
MCHC: 35.9 g/dL (ref 30.0–36.0)
MCV: 88.4 fL (ref 78.0–100.0)
Monocytes Absolute: 3.2 10*3/uL — ABNORMAL HIGH (ref 0.1–1.0)
Monocytes Relative: 17 % — ABNORMAL HIGH (ref 3–12)
Neutro Abs: 10.5 10*3/uL — ABNORMAL HIGH (ref 1.7–7.7)
Neutrophils Relative %: 56 % (ref 43–77)
Platelets: 364 10*3/uL (ref 150–400)
RBC: 2.49 MIL/uL — ABNORMAL LOW (ref 4.22–5.81)
RDW: 19.4 % — ABNORMAL HIGH (ref 11.5–15.5)
WBC: 18.9 10*3/uL — ABNORMAL HIGH (ref 4.0–10.5)

## 2011-11-04 LAB — COMPREHENSIVE METABOLIC PANEL
Albumin: 4 g/dL (ref 3.5–5.2)
BUN: 3 mg/dL — ABNORMAL LOW (ref 6–23)
Calcium: 9 mg/dL (ref 8.4–10.5)
Creatinine, Ser: 0.63 mg/dL (ref 0.50–1.35)
Total Protein: 7 g/dL (ref 6.0–8.3)

## 2011-11-04 LAB — FERRITIN: Ferritin: 2095 ng/mL — ABNORMAL HIGH (ref 22–322)

## 2011-11-04 MED ORDER — ENOXAPARIN SODIUM 40 MG/0.4ML ~~LOC~~ SOLN
40.0000 mg | SUBCUTANEOUS | Status: DC
  Administered 2011-11-04 – 2011-11-08 (×5): 40 mg via SUBCUTANEOUS
  Filled 2011-11-04 (×5): qty 0.4

## 2011-11-04 MED ORDER — POTASSIUM CHLORIDE CRYS ER 20 MEQ PO TBCR
40.0000 meq | EXTENDED_RELEASE_TABLET | Freq: Once | ORAL | Status: AC
  Administered 2011-11-04: 40 meq via ORAL
  Filled 2011-11-04: qty 2

## 2011-11-04 NOTE — Progress Notes (Signed)
In patient room for shift assessment. Patient rating pain at 7/10. Patient able to have pain medicine as ordered q2 hours for pain. Patient also asked for benadryl and zofran. Patient assessed for nausea, and states that he is a "lil nauseous". Patient was asked he thought that he could take benadryl by mouth and patient states that he usually gets his benadryl, zofran and dilaudid together by IV.  Patient informed that the benadryl is to be given by mouth unless alternative route is warranted. Patient states that he is a lil nauseous and needs his benadryl by IV.

## 2011-11-04 NOTE — Progress Notes (Signed)
ANTICOAGULATION CONSULT NOTE - Initial Consult  Pharmacy Consult for Lovenox Indication: VTE prophylaxis  Allergies  Allergen Reactions  . Morphine And Related Hives and Rash    Pt states he is able to tolerate Dilaudid with no reactions.    Patient Measurements: Height: 6' (182.9 cm) Weight: 170 lb (77.111 kg) IBW/kg (Calculated) : 77.6   Vital Signs: Temp: 98.5 F (36.9 C) (10/04 1147) Temp src: Oral (10/04 1147) BP: 112/79 mmHg (10/04 1147) Pulse Rate: 85  (10/04 1147)  Labs:  Basename 11/04/11 0850 11/03/11 1440  HGB 7.9* 8.6*  HCT 22.0* 25.1*  PLT 364 375  APTT -- --  LABPROT -- --  INR -- --  HEPARINUNFRC -- --  CREATININE 0.63 0.63  CKTOTAL -- --  CKMB -- --  TROPONINI -- --    Estimated Creatinine Clearance: 144.6 ml/min (by C-G formula based on Cr of 0.63).   Medical History: Past Medical History  Diagnosis Date  . Sickle cell anemia   . Blood transfusion   . 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 Hypokalemia   . Mood disorder   . Pulmonary embolism   . Avascular necrosis   . Leukocytosis     Chronic  . Thrombocytosis     Chronic    Medications:  Scheduled:    . deferoxamine (DESFERAL) IV  2,000 mg Intravenous QHS  . enoxaparin (LOVENOX) injection  40 mg Subcutaneous Q24H  . folic acid  1 mg Oral Daily  . hydroxyurea  500 mg Oral BID  . oxyCODONE  80 mg Oral Q12H  . potassium chloride SA  20 mEq Oral BID  . potassium chloride  40 mEq Oral Once  . DISCONTD: folic acid  1 mg Oral Daily    Assessment: 31 YOM w/ [place].  Pharmacy asked to dose Lovenox for VTE PPX Scr wnl, low h/h ([place]), pltc wnl  Goal of Therapy:  Appropriate dose of lovenox    Plan:  Lovenox 40mg  sq q24h Will s/o - reconsult prn  Gwen Her 11/04/2011,2:28 PM

## 2011-11-05 ENCOUNTER — Inpatient Hospital Stay (HOSPITAL_COMMUNITY): Payer: Medicare Other

## 2011-11-05 DIAGNOSIS — M2550 Pain in unspecified joint: Secondary | ICD-10-CM | POA: Diagnosis not present

## 2011-11-05 DIAGNOSIS — D5700 Hb-SS disease with crisis, unspecified: Secondary | ICD-10-CM | POA: Diagnosis not present

## 2011-11-05 DIAGNOSIS — D72829 Elevated white blood cell count, unspecified: Secondary | ICD-10-CM | POA: Diagnosis not present

## 2011-11-05 DIAGNOSIS — M87029 Idiopathic aseptic necrosis of unspecified humerus: Secondary | ICD-10-CM | POA: Diagnosis not present

## 2011-11-05 DIAGNOSIS — D7289 Other specified disorders of white blood cells: Secondary | ICD-10-CM | POA: Diagnosis not present

## 2011-11-05 DIAGNOSIS — G8929 Other chronic pain: Secondary | ICD-10-CM | POA: Diagnosis not present

## 2011-11-05 DIAGNOSIS — D571 Sickle-cell disease without crisis: Secondary | ICD-10-CM | POA: Diagnosis not present

## 2011-11-05 DIAGNOSIS — Z452 Encounter for adjustment and management of vascular access device: Secondary | ICD-10-CM | POA: Diagnosis not present

## 2011-11-05 DIAGNOSIS — R52 Pain, unspecified: Secondary | ICD-10-CM | POA: Diagnosis not present

## 2011-11-05 LAB — CBC WITH DIFFERENTIAL/PLATELET
Basophils Absolute: 0.2 10*3/uL — ABNORMAL HIGH (ref 0.0–0.1)
Eosinophils Relative: 5 % (ref 0–5)
HCT: 21.5 % — ABNORMAL LOW (ref 39.0–52.0)
Lymphs Abs: 3.6 10*3/uL (ref 0.7–4.0)
MCH: 29.8 pg (ref 26.0–34.0)
MCV: 86.7 fL (ref 78.0–100.0)
Monocytes Absolute: 2.9 10*3/uL — ABNORMAL HIGH (ref 0.1–1.0)
Monocytes Relative: 16 % — ABNORMAL HIGH (ref 3–12)
Neutro Abs: 10.5 10*3/uL — ABNORMAL HIGH (ref 1.7–7.7)
RDW: 20.2 % — ABNORMAL HIGH (ref 11.5–15.5)
WBC: 18.1 10*3/uL — ABNORMAL HIGH (ref 4.0–10.5)

## 2011-11-05 LAB — COMPREHENSIVE METABOLIC PANEL
BUN: 3 mg/dL — ABNORMAL LOW (ref 6–23)
CO2: 19 mEq/L (ref 19–32)
Calcium: 9 mg/dL (ref 8.4–10.5)
Chloride: 106 mEq/L (ref 96–112)
Creatinine, Ser: 0.62 mg/dL (ref 0.50–1.35)
GFR calc non Af Amer: >90 mL/min (ref >90)
Total Bilirubin: 6.3 mg/dL — ABNORMAL HIGH (ref 0.3–1.2)

## 2011-11-05 MED ORDER — LIDOCAINE HCL 1 % IJ SOLN
INTRAMUSCULAR | Status: AC
  Administered 2011-11-05: 10:00:00
  Filled 2011-11-05: qty 20

## 2011-11-05 NOTE — Procedures (Signed)
Procedure:  Right PICC line Access:  Right brachial v. 36 cm DL Power PICC placed.  Tip at cavoatrial junction.

## 2011-11-05 NOTE — Progress Notes (Signed)
Subjective:  Feeling better. No distress. T max 99.4 F  Objective:  Vital Signs in the last 24 hours: Temp:  [98.7 F (37.1 C)-99.4 F (37.4 C)] 98.8 F (37.1 C) (10/05 1500) Pulse Rate:  [75-86] 86  (10/05 1500) Cardiac Rhythm:  [-]  Resp:  [16-20] 18  (10/05 1500) BP: (124-131)/(78-89) 127/79 mmHg (10/05 1500) SpO2:  [96 %-100 %] 96 % (10/05 1500) Weight:  [81.8 kg (180 lb 5.4 oz)] 81.8 kg (180 lb 5.4 oz) (10/05 0645)  Physical Exam: BP Readings from Last 1 Encounters:  11/05/11 127/79    Wt Readings from Last 1 Encounters:  11/05/11 81.8 kg (180 lb 5.4 oz)    Weight change: 0 kg (0 lb)  HEENT: [place]/AT, Eyes-Brown, PERL, EOMI, Conjunctiva-Pale, Sclera-Non-icteric Neck: No JVD, No bruit, Trachea midline. Lungs:  Clear, Bilateral. Cardiac:  Regular rhythm, normal S1 and S2, no S3.  Abdomen:  Soft, non-tender. Extremities:  No edema present. No cyanosis. No clubbing. CNS: AxOx3, Cranial nerves grossly intact, moves all 4 extremities. Right handed. Skin: Warm and dry.   Intake/Output from previous day: 10/04 0701 - 10/05 0700 In: 4702.1 [I.V.:4202.1; IV Piggyback:500] Out: -     Lab Results: BMET    Component Value Date/Time   NA 137 11/05/2011 0727   K 3.9 11/05/2011 0727   CL 106 11/05/2011 0727   CO2 19 11/05/2011 0727   GLUCOSE 113* 11/05/2011 0727   BUN 3* 11/05/2011 0727   CREATININE 0.62 11/05/2011 0727   CALCIUM 9.0 11/05/2011 0727   GFRNONAA >90 11/05/2011 0727   GFRAA >90 11/05/2011 0727   CBC    Component Value Date/Time   WBC 18.1* 11/05/2011 0727   RBC 2.48* 11/05/2011 0727   HGB 7.4* 11/05/2011 0727   HCT 21.5* 11/05/2011 0727   PLT 356 11/05/2011 0727   MCV 86.7 11/05/2011 0727   MCH 29.8 11/05/2011 0727   MCHC 34.4 11/05/2011 0727   RDW 20.2* 11/05/2011 0727   LYMPHSABS 3.6 11/05/2011 0727   MONOABS 2.9* 11/05/2011 0727   EOSABS 0.9* 11/05/2011 0727   BASOSABS 0.2* 11/05/2011 0727   CARDIAC ENZYMES Lab Results  Component Value Date   CKTOTAL 136  04/23/2011   CKMB 1.5 04/23/2011   TROPONINI <0.30 04/23/2011    Assessment/Plan:  Patient Active Hospital Problem List: Acute sickle cell crisis  Marked leukocytosis- History of PE: Avascular necrosis of shoulders: stable somnia- Ambien prn  Hemochromatosis   Medical treatment   LOS: 2 days    Orpah Cobb  MD  11/05/2011, 4:20 PM

## 2011-11-05 NOTE — Progress Notes (Signed)
MRSA PCR Negative. Removed from contact precautions.Hartley Barefoot

## 2011-11-06 DIAGNOSIS — D7289 Other specified disorders of white blood cells: Secondary | ICD-10-CM | POA: Diagnosis not present

## 2011-11-06 DIAGNOSIS — D72829 Elevated white blood cell count, unspecified: Secondary | ICD-10-CM | POA: Diagnosis not present

## 2011-11-06 DIAGNOSIS — M87029 Idiopathic aseptic necrosis of unspecified humerus: Secondary | ICD-10-CM | POA: Diagnosis not present

## 2011-11-06 DIAGNOSIS — G8929 Other chronic pain: Secondary | ICD-10-CM | POA: Diagnosis not present

## 2011-11-06 DIAGNOSIS — R52 Pain, unspecified: Secondary | ICD-10-CM | POA: Diagnosis not present

## 2011-11-06 DIAGNOSIS — D5700 Hb-SS disease with crisis, unspecified: Secondary | ICD-10-CM | POA: Diagnosis not present

## 2011-11-06 DIAGNOSIS — M2550 Pain in unspecified joint: Secondary | ICD-10-CM | POA: Diagnosis not present

## 2011-11-06 LAB — COMPREHENSIVE METABOLIC PANEL
ALT: 35 U/L (ref 0–53)
Alkaline Phosphatase: 86 U/L (ref 39–117)
CO2: 23 mEq/L (ref 19–32)
Chloride: 104 mEq/L (ref 96–112)
GFR calc Af Amer: >90 mL/min (ref >90)
GFR calc non Af Amer: >90 mL/min (ref >90)
Glucose, Bld: 106 mg/dL — ABNORMAL HIGH (ref 70–99)
Potassium: 3.2 mEq/L — ABNORMAL LOW (ref 3.5–5.1)
Sodium: 136 mEq/L (ref 135–145)
Total Protein: 6.7 g/dL (ref 6.0–8.3)

## 2011-11-06 LAB — CBC WITH DIFFERENTIAL/PLATELET
Lymphocytes Relative: 19 % (ref 12–46)
Lymphs Abs: 3.2 10*3/uL (ref 0.7–4.0)
Neutro Abs: 9.7 10*3/uL — ABNORMAL HIGH (ref 1.7–7.7)
Neutrophils Relative %: 58 % (ref 43–77)
Platelets: 416 10*3/uL — ABNORMAL HIGH (ref 150–400)
RBC: 2.3 MIL/uL — ABNORMAL LOW (ref 4.22–5.81)
WBC: 16.6 10*3/uL — ABNORMAL HIGH (ref 4.0–10.5)

## 2011-11-06 NOTE — Progress Notes (Signed)
Subjective:  No new complaints. Mild cough. T max 99.3 F  Objective:  Vital Signs in the last 24 hours: Temp:  [98.6 F (37 C)-99.3 F (37.4 C)] 98.8 F (37.1 C) (10/06 1500) Pulse Rate:  [74-83] 82  (10/06 1500) Cardiac Rhythm:  [-]  Resp:  [16-20] 20  (10/06 1500) BP: (122-133)/(68-88) 122/75 mmHg (10/06 1500) SpO2:  [95 %-99 %] 96 % (10/06 1500) Weight:  [82.1 kg (181 lb)] 82.1 kg (181 lb) (10/06 0126)  Physical Exam: BP Readings from Last 1 Encounters:  11/06/11 122/75    Wt Readings from Last 1 Encounters:  11/06/11 82.1 kg (181 lb)    Weight change: 4.988 kg (11 lb)  HEENT: [place]/AT, Eyes-Brown, PERL, EOMI, Conjunctiva-Pale, Sclera-Non-icteric Neck: No JVD, No bruit, Trachea midline. Lungs:  Clear, Bilateral. Cardiac:  Regular rhythm, normal S1 and S2, no S3.  Abdomen:  Soft, non-tender. Extremities:  No edema present. No cyanosis. No clubbing. CNS: AxOx3, Cranial nerves grossly intact, moves all 4 extremities. Right handed. Skin: Warm and dry.   Intake/Output from previous day: 10/05 0701 - 10/06 0700 In: 3277.9 [P.O.:480; I.V.:2297.9; IV Piggyback:500] Out: -     Lab Results: BMET    Component Value Date/Time   NA 136 11/06/2011 0700   K 3.2* 11/06/2011 0700   CL 104 11/06/2011 0700   CO2 23 11/06/2011 0700   GLUCOSE 106* 11/06/2011 0700   BUN 4* 11/06/2011 0700   CREATININE 0.60 11/06/2011 0700   CALCIUM 8.6 11/06/2011 0700   GFRNONAA >90 11/06/2011 0700   GFRAA >90 11/06/2011 0700   CBC    Component Value Date/Time   WBC 16.6* 11/06/2011 0700   RBC 2.30* 11/06/2011 0700   HGB 7.2* 11/06/2011 0700   HCT 20.2* 11/06/2011 0700   PLT 416* 11/06/2011 0700   MCV 87.8 11/06/2011 0700   MCH 31.3 11/06/2011 0700   MCHC 35.6 11/06/2011 0700   RDW 20.8* 11/06/2011 0700   LYMPHSABS 3.2 11/06/2011 0700   MONOABS 2.7* 11/06/2011 0700   EOSABS 0.9* 11/06/2011 0700   BASOSABS 0.2* 11/06/2011 0700   CARDIAC ENZYMES Lab Results  Component Value Date   CKTOTAL 136 04/23/2011   CKMB 1.5 04/23/2011   TROPONINI <0.30 04/23/2011    Assessment/Plan:  Patient Active Hospital Problem List: Acute sickle cell crisis  Leukocytosis History of PE Avascular necrosis of shoulders: stable  Insomnia- Ambien prn  Hemochromatosis   Continue Medical treatment        LOS: 3 days    Orpah Cobb  MD  11/06/2011, 4:42 PM

## 2011-11-07 ENCOUNTER — Non-Acute Institutional Stay (HOSPITAL_COMMUNITY)
Admission: AD | Admit: 2011-11-07 | Discharge: 2011-11-08 | Disposition: A | Discharge status: Home / Self Care | Payer: Medicare Other | Source: Ambulatory Visit | Attending: Internal Medicine | Admitting: Internal Medicine

## 2011-11-07 DIAGNOSIS — D649 Anemia, unspecified: Secondary | ICD-10-CM | POA: Insufficient documentation

## 2011-11-07 DIAGNOSIS — G8929 Other chronic pain: Secondary | ICD-10-CM | POA: Diagnosis not present

## 2011-11-07 DIAGNOSIS — D5700 Hb-SS disease with crisis, unspecified: Secondary | ICD-10-CM | POA: Insufficient documentation

## 2011-11-07 DIAGNOSIS — R52 Pain, unspecified: Secondary | ICD-10-CM | POA: Insufficient documentation

## 2011-11-07 DIAGNOSIS — M87029 Idiopathic aseptic necrosis of unspecified humerus: Secondary | ICD-10-CM | POA: Diagnosis not present

## 2011-11-07 DIAGNOSIS — D72829 Elevated white blood cell count, unspecified: Secondary | ICD-10-CM | POA: Diagnosis not present

## 2011-11-07 LAB — COMPREHENSIVE METABOLIC PANEL
BUN: 4 mg/dL — ABNORMAL LOW (ref 6–23)
Calcium: 8.5 mg/dL (ref 8.4–10.5)
Creatinine, Ser: 0.59 mg/dL (ref 0.50–1.35)
GFR calc Af Amer: >90 mL/min (ref >90)
Glucose, Bld: 106 mg/dL — ABNORMAL HIGH (ref 70–99)
Sodium: 136 mEq/L (ref 135–145)
Total Protein: 7 g/dL (ref 6.0–8.3)

## 2011-11-07 LAB — CBC WITH DIFFERENTIAL/PLATELET
Basophils Absolute: 0.1 10*3/uL (ref 0.0–0.1)
Eosinophils Absolute: 1 10*3/uL — ABNORMAL HIGH (ref 0.0–0.7)
HCT: 20 % — ABNORMAL LOW (ref 39.0–52.0)
Lymphocytes Relative: 26 % (ref 12–46)
Lymphs Abs: 3.8 10*3/uL (ref 0.7–4.0)
MCHC: 35 g/dL (ref 30.0–36.0)
Neutro Abs: 7.4 10*3/uL (ref 1.7–7.7)
RDW: 21.4 % — ABNORMAL HIGH (ref 11.5–15.5)

## 2011-11-07 LAB — PREPARE RBC (CROSSMATCH)

## 2011-11-07 MED ORDER — OLANZAPINE 10 MG PO TABS
10.0000 mg | ORAL_TABLET | Freq: Every day | ORAL | Status: DC
  Filled 2011-11-07: qty 1

## 2011-11-07 MED ORDER — HALOPERIDOL LACTATE 5 MG/ML IJ SOLN: 5.0000 mg | Freq: Four times a day (QID) | INTRAMUSCULAR | Status: DC | PRN

## 2011-11-07 MED ORDER — OLANZAPINE 10 MG PO TABS
10.0000 mg | ORAL_TABLET | Freq: Every day | ORAL | Status: DC
  Administered 2011-11-07 – 2011-11-08 (×2): 10 mg via ORAL
  Filled 2011-11-07 (×2): qty 1

## 2011-11-07 MED ORDER — OLANZAPINE 7.5 MG PO TABS: 7.5000 mg | ORAL_TABLET | Freq: Every day | ORAL | Status: DC

## 2011-11-07 NOTE — Progress Notes (Signed)
The patient has escalated even further with his hostile and aggressive behavior.  [name] NP at bedside to help calm patient down and explain policy on this floor in regard to administering sedating medication.  Toma Copier, RN/Charge RN in room as well.  Security called to the floor to ensure staff safety.  [facility] for [facility] being.

## 2011-11-07 NOTE — Progress Notes (Signed)
[facility] Progress Note    Date: 11/07/2011  Patient name: [name] Medical record number: [id number] Date of birth: Apr 22, 1980 Age: 31 y.o. Gender: male PCP: [name], MD  Attending physician: Gwenyth Bender, MD  SUBJECTIVE:  [name] was seen on rounds today. Sitting up in the bed in no acute or apparent distress.  He complains of  nausea, vomiting but no emesis during this hospitalization control by/on Zofran also has  back pain, bilateral rib pain, and diffuse bilateral upper quadrant abdominal pain  He reports all of his pain to be constant, sharp and rated 610.  He currently denies fever, chills, chest pain, shortness of breath, or priapism.    Meds: Prescriptions prior to admission  Medication Sig Dispense Refill  . folic acid (FOLVITE) 1 MG tablet Take 1 mg by mouth daily.      Marland Kitchen HYDROmorphone (DILAUDID) 4 MG tablet Take 1 tablet (4 mg total) by mouth every 4 (four) hours as needed. For pain.  120 tablet  0  . hydroxyurea (HYDREA) 500 MG capsule Take 500 mg by mouth 2 (two) times daily. May take with food to minimize GI side effects.      Marland Kitchen ibuprofen (ADVIL,MOTRIN) 800 MG tablet Take 800 mg by mouth every 8 (eight) hours as needed. For pain.      Marland Kitchen oxyCODONE (OXYCONTIN) 80 MG 12 hr tablet Take 1 tablet (80 mg total) by mouth every 12 (twelve) hours.  60 tablet  0  . potassium chloride SA (K-DUR,KLOR-CON) 20 MEQ tablet Take 1 tablet (20 mEq total) by mouth 2 (two) times daily.  60 tablet  1  . temazepam (RESTORIL) 7.5 MG capsule Take 1 capsule (7.5 mg total) by mouth at bedtime as needed for sleep.  30 capsule  0    Allergies: Morphine and related Past Medical History  Diagnosis Date  . Sickle cell anemia   . Blood transfusion   . 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 Hypokalemia   . Mood disorder   . Pulmonary embolism   . Avascular necrosis   . Leukocytosis     Chronic  . Thrombocytosis     Chronic   Past  Surgical History  Procedure Date  . Right hip replacement     08/2006  . Cholecystectomy     01/2008  . Porta cath placement   . Porta cath removal   . Umbilical hernia repair     01/2008  . Excision of left periauricular cyst     10/2009  . Excision of right ear lobe cyst with primary closur     11/2007   Family History  Problem Relation Age of Onset  . Sickle cell anemia Mother   . Sickle cell anemia Father   . Sickle cell trait Brother    History   Social History  . Marital Status: Single    Spouse Name: N/A    Number of Children: 0  . Years of Education: 13   Occupational History  . Unemployed    Social History Main Topics  . Smoking status: Former Smoker -- 13 years    Types: Cigarettes    Quit date: 07/08/2010  . Smokeless tobacco: Never Used  . Alcohol Use: No  . Drug Use: No     quit smoking 2011  . Sexually Active: Yes    Birth Control/ Protection: None   Other Topics Concern  . Not on file  Social History Narrative   Single.  Lives with girlfriend.      Review of Systems: As noted in the HPI  Physical Exam: Blood pressure 129/76, pulse 88, temperature 99.2 F (37.3 C), temperature source Oral, resp. rate 18, height 6' (1.829 m), weight 80.3 kg (177 lb 0.5 oz), SpO2 100.00%.  General Appearance: Alert and oriented, cooperative, well developed, well nourished, mild distress  Head: Normocephalic, without obvious abnormality, atraumatic  Eyes: PERRL, slight strabismus, mild icteric sclera  Nose: Nares normal, septum midline, mucosa normal, no drainage or sinus tenderness.  Throat: Normal lips, mucosa, tongue and gums  Back: Symmetric, mild CVA/flank tenderness, lower back tenderness  Resp: even and nonlabored, no wheezes/rales/rhonchi  Cardio: S1, S2 regular, no murmurs/clicks/rubs/gallops  GI: Soft, ND, Diffuse upper abdominal tenderness, active bowel sounds, no masses, no organomegaly appreciated  Male Genitalia: Deferred, the patient denies  priapism  Extremities: Extremities normal, atraumatic, no cyanosis or edema, negative Homans sign, no sign of DVT, no tenderness  Pulses: 2+ and symmetric  Skin: Skin color, texture, turgor normal, no rashes or lesions, numerous tattoos  Neurologic: Grossly normal, CN II-XII intact, no focal deficits  Psych: Appropriate affect    Lab results: Results for orders placed during the hospital encounter of 11/03/11 (from the past 24 hour(s))  CBC WITH DIFFERENTIAL     Status: Abnormal   Collection Time   11/07/11  6:30 AM      Component Value Range   WBC 14.5 (*) 4.0 - 10.5 K/uL   RBC 2.27 (*) 4.22 - 5.81 MIL/uL   Hemoglobin 7.0 (*) 13.0 - 17.0 g/dL   HCT 16.1 (*) 09.6 - 04.5 %   MCV 88.1  78.0 - 100.0 fL   MCH 30.8  26.0 - 34.0 pg   MCHC 35.0  30.0 - 36.0 g/dL   RDW 40.9 (*) 81.1 - 91.4 %   Platelets 426 (*) 150 - 400 K/uL   Neutrophils Relative 51  43 - 77 %   Lymphocytes Relative 26  12 - 46 %   Monocytes Relative 15 (*) 3 - 12 %   Eosinophils Relative 7 (*) 0 - 5 %   Basophils Relative 1  0 - 1 %   Neutro Abs 7.4  1.7 - 7.7 K/uL   Lymphs Abs 3.8  0.7 - 4.0 K/uL   Monocytes Absolute 2.2 (*) 0.1 - 1.0 K/uL   Eosinophils Absolute 1.0 (*) 0.0 - 0.7 K/uL   Basophils Absolute 0.1  0.0 - 0.1 K/uL   RBC Morphology POLYCHROMASIA PRESENT     Smear Review LARGE PLATELETS PRESENT    COMPREHENSIVE METABOLIC PANEL     Status: Abnormal   Collection Time   11/07/11  6:30 AM      Component Value Range   Sodium 136  135 - 145 mEq/L   Potassium 3.6  3.5 - 5.1 mEq/L   Chloride 105  96 - 112 mEq/L   CO2 22  19 - 32 mEq/L   Glucose, Bld 106 (*) 70 - 99 mg/dL   BUN 4 (*) 6 - 23 mg/dL   Creatinine, Ser 7.82  0.50 - 1.35 mg/dL   Calcium 8.5  8.4 - 95.6 mg/dL   Total Protein 7.0  6.0 - 8.3 g/dL   Albumin 4.0  3.5 - 5.2 g/dL   AST 54 (*) 0 - 37 U/L   ALT 41  0 - 53 U/L   Alkaline Phosphatase 91  39 - 117 U/L   Total  Bilirubin 5.5 (*) 0.3 - 1.2 mg/dL   GFR calc non Af Amer >90  >90 mL/min   GFR  calc Af Amer >90  >90 mL/min  PREPARE RBC (CROSSMATCH)     Status: Normal   Collection Time   11/07/11 10:30 AM      Component Value Range   Order Confirmation ORDER PROCESSED BY BLOOD BANK    TYPE AND SCREEN     Status: Normal   Collection Time   11/07/11 11:25 AM      Component Value Range   ABO/RH(D) O POS     Antibody Screen NEG     Sample Expiration 11/10/2011     Unit Number [id number]     Blood Component Type RED CELLS,LR     Unit division 00     Status of Unit REL FROM [facility]     Donor AG Type       Value: NEGATIVE FOR E ANTIGEN NEGATIVE FOR C ANTIGEN NEGATIVE FOR KELL ANTIGEN   Transfusion Status OK TO TRANSFUSE     Crossmatch Result COMPATIBLE      Imaging results:  No results found.  Assessment & Plan:  Sickle cell crisis- SS Disease  T. 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

## 2011-11-07 NOTE — Progress Notes (Signed)
The patient became verbally aggressive during administration of his 80mg  oxycontin because this nurse explained to the patient that per policy we can only give two sedating medications at a time and must wait 30 minutes to reassess sedation level.  The patient insisted on receiving IV Dilaudid 4mg , IV Benadryl and IV Zofran right then. Patient's verbal aggression quickly escalated [facility] Nodal paged to floor and Dr. [name] notified. Upon contacting Dr. [name] this nurse informed him of patient's behavior, zyprexa ordered.  Also discussed with Dr. [name] the need for PO Benadryl and Zofran to be used first as pt is eating and drinking w/o complication.  Dr. [name] was agreeable with PO being used first.  Before pt could have his PO Benadryl and PO Zofran the order was changed to make the route of these medications to IV.

## 2011-11-08 DIAGNOSIS — G8929 Other chronic pain: Secondary | ICD-10-CM | POA: Diagnosis not present

## 2011-11-08 DIAGNOSIS — D649 Anemia, unspecified: Secondary | ICD-10-CM | POA: Diagnosis not present

## 2011-11-08 DIAGNOSIS — R52 Pain, unspecified: Secondary | ICD-10-CM | POA: Diagnosis not present

## 2011-11-08 DIAGNOSIS — D5700 Hb-SS disease with crisis, unspecified: Secondary | ICD-10-CM | POA: Diagnosis not present

## 2011-11-08 LAB — CBC WITH DIFFERENTIAL/PLATELET
Basophils Relative: 1 % (ref 0–1)
Eosinophils Absolute: 0.7 10*3/uL (ref 0.0–0.7)
Eosinophils Relative: 5 % (ref 0–5)
Hemoglobin: 7.8 g/dL — ABNORMAL LOW (ref 13.0–17.0)
Lymphocytes Relative: 26 % (ref 12–46)
MCHC: 34.2 g/dL (ref 30.0–36.0)
Neutrophils Relative %: 54 % (ref 43–77)
Platelets: 478 10*3/uL — ABNORMAL HIGH (ref 150–400)
RBC: 2.65 MIL/uL — ABNORMAL LOW (ref 4.22–5.81)

## 2011-11-08 LAB — COMPREHENSIVE METABOLIC PANEL
ALT: 37 U/L (ref 0–53)
AST: 44 U/L — ABNORMAL HIGH (ref 0–37)
Albumin: 3.9 g/dL (ref 3.5–5.2)
CO2: 23 mEq/L (ref 19–32)
Calcium: 8.6 mg/dL (ref 8.4–10.5)
Creatinine, Ser: 0.56 mg/dL (ref 0.50–1.35)
GFR calc non Af Amer: >90 mL/min (ref >90)
Sodium: 136 mEq/L (ref 135–145)
Total Protein: 6.9 g/dL (ref 6.0–8.3)

## 2011-11-08 LAB — HEMOGLOBINOPATHY EVALUATION
Hgb A2 Quant: 3.3 % — ABNORMAL HIGH (ref 2.2–3.2)
Hgb A: 23.8 % — ABNORMAL LOW (ref 96.8–97.8)
Hgb S Quant: 67.1 % — ABNORMAL HIGH

## 2011-11-08 LAB — TYPE AND SCREEN: Unit division: 0

## 2011-11-08 MED ORDER — OLANZAPINE 10 MG PO TABS: 10.0000 mg | ORAL_TABLET | Freq: Every day | ORAL | Status: DC

## 2011-11-08 MED ORDER — OXYCODONE HCL 80 MG PO TB12: 80.0000 mg | ORAL_TABLET | Freq: Two times a day (BID) | ORAL | Status: DC

## 2011-11-08 MED ORDER — TEMAZEPAM 7.5 MG PO CAPS: 7.5000 mg | ORAL_CAPSULE | Freq: Every evening | ORAL | Status: DC | PRN

## 2011-11-08 MED ORDER — HYDROMORPHONE HCL 4 MG PO TABS: 4.0000 mg | ORAL_TABLET | ORAL | Status: DC | PRN

## 2011-11-08 NOTE — Progress Notes (Signed)
Late entry  RN staff Toma Copier, approached me regarding patients " hostile and aggressive behavior" in which he was upset and yelling at the nurse due to his current medication administration regimen.  I attempted to deescalate the situation, which included conferring with Dr. [name]. His recommendation was to d/c oral medications to allow administration of IV only. Of note, he had not been taking his Zyprexa since admission and this was felt to be a contributing factor to his behavior. The need for security intervention was felt necessary by the staff and due to the votallity of this situation, the decision was made to transfer him to the [facility] for further evaluation and treatment.

## 2011-11-08 NOTE — Progress Notes (Signed)
Pt discharged to home per order. PIC line was removed by Danny from the IV team at 2315. Here for 30 minutes after [facility] line removed for observation of site. At time of discharge the  Site where [facility] line removed is clean, dry and intact, no bleeding at site. Dressing at site was placed by IV team member Danny. Pt has a friend that will pick him up from this [facility] and driven to his home. Discharge instructions given. Pt verbalizes understanding. Pt does state he was told by Dr [name] that he would be given a prescription for Ambien, but instead given restoril. Advised that I will leave a note about this matter to Dr [name] and Nurse relieving me in the am. Pt has no other complaints or concerns. Alert oriented and ambulating without difficulty.

## 2011-11-08 NOTE — Progress Notes (Signed)
Received call from Marcelino Duster, NP stating that pt will be transferred to [facility] after the inpatient nurse calls and gives report. Pt can be heard in background being irate and yelling. Inquired as to why patient was being transferred. No explanation given until Marcelino Duster, NP arrived to [facility]. Received report from nurse on 3W stating that pt is very angry and irate with the staff due to inpatient nurse trying to enforce policy as stated on [place] website regarding giving two or more sedating medications at one time.  Due to patients aggressive behavior security as well as [employer] has been called for their safety. Nurse on 3W apologized for transferring patient in such an angry state but she has to follow the orders given. NOC ASA will go to 3W with wheelchair to transfer pt to [facility].

## 2011-11-09 LAB — HEMOGLOBINOPATHY EVALUATION
Hgb A2 Quant: 3.4 % — ABNORMAL HIGH (ref 2.2–3.2)
Hgb A: 23.4 % — ABNORMAL LOW (ref 96.8–97.8)
Hgb F Quant: 5.5 % — ABNORMAL HIGH (ref 0.0–2.0)
Hgb S Quant: 67.7 % — ABNORMAL HIGH

## 2011-11-12 ENCOUNTER — Encounter (HOSPITAL_COMMUNITY): Payer: Self-pay

## 2011-11-12 ENCOUNTER — Non-Acute Institutional Stay (HOSPITAL_COMMUNITY)
Admission: AD | Admit: 2011-11-12 | Discharge: 2011-11-13 | Disposition: A | Discharge status: Home / Self Care | Payer: Medicare Other | Attending: Internal Medicine | Admitting: Internal Medicine

## 2011-11-12 ENCOUNTER — Telehealth (HOSPITAL_COMMUNITY): Payer: Self-pay

## 2011-11-12 DIAGNOSIS — D5700 Hb-SS disease with crisis, unspecified: Secondary | ICD-10-CM | POA: Insufficient documentation

## 2011-11-12 DIAGNOSIS — G894 Chronic pain syndrome: Secondary | ICD-10-CM | POA: Insufficient documentation

## 2011-11-12 DIAGNOSIS — M79609 Pain in unspecified limb: Secondary | ICD-10-CM | POA: Diagnosis not present

## 2011-11-12 DIAGNOSIS — R52 Pain, unspecified: Secondary | ICD-10-CM | POA: Diagnosis not present

## 2011-11-12 DIAGNOSIS — R109 Unspecified abdominal pain: Secondary | ICD-10-CM | POA: Diagnosis not present

## 2011-11-12 DIAGNOSIS — D57819 Other sickle-cell disorders with crisis, unspecified: Secondary | ICD-10-CM | POA: Diagnosis not present

## 2011-11-12 MED ORDER — DEXTROSE-NACL 5-0.45 % IV SOLN
INTRAVENOUS | Status: DC
  Administered 2011-11-12: via INTRAVENOUS

## 2011-11-12 MED ORDER — POTASSIUM CHLORIDE CRYS ER 20 MEQ PO TBCR
20.0000 meq | EXTENDED_RELEASE_TABLET | Freq: Two times a day (BID) | ORAL | Status: DC
Start: 2011-11-12 — End: 2011-11-13
  Administered 2011-11-12 – 2011-11-13 (×2): 20 meq via ORAL
  Filled 2011-11-12 (×2): qty 1

## 2011-11-12 MED ORDER — HYDROMORPHONE HCL PF 2 MG/ML IJ SOLN
2.0000 mg | INTRAMUSCULAR | Status: DC | PRN
  Administered 2011-11-12 – 2011-11-13 (×3): 4 mg via INTRAVENOUS
  Administered 2011-11-13: 2 mg via INTRAVENOUS
  Administered 2011-11-13: 3 mg via INTRAVENOUS
  Administered 2011-11-13 (×2): 4 mg via INTRAVENOUS
  Filled 2011-11-12 (×3): qty 2
  Filled 2011-11-12 (×2): qty 1

## 2011-11-12 MED ORDER — ONDANSETRON HCL 4 MG/2ML IJ SOLN
4.0000 mg | INTRAMUSCULAR | Status: DC | PRN
  Administered 2011-11-12 – 2011-11-13 (×4): 4 mg via INTRAVENOUS
  Filled 2011-11-12 (×4): qty 2

## 2011-11-12 MED ORDER — HYDROMORPHONE HCL 4 MG PO TABS
4.0000 mg | ORAL_TABLET | ORAL | Status: DC | PRN
  Filled 2011-11-12: qty 1

## 2011-11-12 MED ORDER — OXYCODONE HCL 80 MG PO TB12
80.0000 mg | ORAL_TABLET | Freq: Two times a day (BID) | ORAL | Status: DC
  Administered 2011-11-12 – 2011-11-13 (×2): 80 mg via ORAL
  Filled 2011-11-12 (×2): qty 1

## 2011-11-12 MED ORDER — ONDANSETRON HCL 4 MG PO TABS
4.0000 mg | ORAL_TABLET | ORAL | Status: DC | PRN
  Administered 2011-11-12: 4 mg via ORAL
  Filled 2011-11-12: qty 1

## 2011-11-12 MED ORDER — HYDROMORPHONE HCL PF 2 MG/ML IJ SOLN
4.0000 mg | Freq: Once | INTRAMUSCULAR | Status: AC
  Administered 2011-11-12: 4 mg via INTRAMUSCULAR

## 2011-11-12 MED ORDER — FOLIC ACID 1 MG PO TABS
1.0000 mg | ORAL_TABLET | Freq: Every day | ORAL | Status: DC
  Administered 2011-11-12 – 2011-11-13 (×2): 1 mg via ORAL
  Filled 2011-11-12 (×2): qty 1

## 2011-11-12 MED ORDER — HYDROMORPHONE HCL PF 2 MG/ML IJ SOLN
4.0000 mg | INTRAMUSCULAR | Status: DC | PRN
  Administered 2011-11-12 (×2): 4 mg via INTRAMUSCULAR
  Filled 2011-11-12 (×6): qty 2
  Filled 2011-11-12: qty 1

## 2011-11-12 MED ORDER — DIPHENHYDRAMINE HCL 25 MG PO CAPS
25.0000 mg | ORAL_CAPSULE | ORAL | Status: DC | PRN
  Administered 2011-11-12: 25 mg via ORAL
  Filled 2011-11-12: qty 1

## 2011-11-12 MED ORDER — IBUPROFEN 800 MG PO TABS: 800.0000 mg | ORAL_TABLET | Freq: Three times a day (TID) | ORAL | Status: DC | PRN

## 2011-11-12 MED ORDER — OLANZAPINE 10 MG PO TABS
10.0000 mg | ORAL_TABLET | Freq: Every day | ORAL | Status: DC
  Administered 2011-11-12: 10 mg via ORAL
  Filled 2011-11-12 (×2): qty 1

## 2011-11-12 MED ORDER — DIPHENHYDRAMINE HCL 50 MG/ML IJ SOLN
12.5000 mg | INTRAMUSCULAR | Status: DC | PRN
  Administered 2011-11-12 – 2011-11-13 (×4): 25 mg via INTRAVENOUS
  Filled 2011-11-12 (×4): qty 1

## 2011-11-12 MED ORDER — TEMAZEPAM 7.5 MG PO CAPS: 7.5000 mg | ORAL_CAPSULE | Freq: Every evening | ORAL | Status: DC | PRN

## 2011-11-12 NOTE — H&P (Signed)
[facility] History and Physical   Date: 11/12/2011  Patient name: [name] Medical record number: [id number] Date of birth: 1980-12-24 Age: 31 y.o. Gender: male PCP: [name], MD  Attending physician: Gwenyth Bender, MD  Chief Complaint: right arm and right rib cage pain  History of Present Illness: [name] is a 31 y.o. African American male with SS Sickle Cell Disease called to the [facility] for treatment because he was not getting relief from his home medications. Today he presented to the [facility]  with complaints of right rib cage and right arm pain.  He had no relief from his oral pain medications. Reports all of his pain to be constant, sharp and rated as 9/10 . He e was admitted for 23 hr observation.  He currently denies fever, chills, shortness of breath,  or priapism. Meds: Prescriptions prior to admission  Medication Sig Dispense Refill  . folic acid (FOLVITE) 1 MG tablet Take 1 mg by mouth daily.      Marland Kitchen HYDROmorphone (DILAUDID) 4 MG tablet Take 1 tablet (4 mg total) by mouth every 4 (four) hours as needed. For pain.  90 tablet  0  . hydroxyurea (HYDREA) 500 MG capsule Take 500 mg by mouth 2 (two) times daily. May take with food to minimize GI side effects.      Marland Kitchen ibuprofen (ADVIL,MOTRIN) 800 MG tablet Take 800 mg by mouth every 8 (eight) hours as needed. For pain.      Marland Kitchen OLANZapine (ZYPREXA) 10 MG tablet Take 1 tablet (10 mg total) by mouth at bedtime.  30 tablet  2  . oxyCODONE (OXYCONTIN) 80 MG 12 hr tablet Take 1 tablet (80 mg total) by mouth every 12 (twelve) hours.  30 tablet  0  . potassium chloride SA (K-DUR,KLOR-CON) 20 MEQ tablet Take 1 tablet (20 mEq total) by mouth 2 (two) times daily.  60 tablet  1  . temazepam (RESTORIL) 7.5 MG capsule Take 1 capsule (7.5 mg total) by mouth at bedtime as needed for sleep.  30 capsule  0    Allergies: Morphine and related Past Medical History  Diagnosis Date  . Sickle cell anemia   . Blood  transfusion   . 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 Hypokalemia   . Mood disorder   . Pulmonary embolism   . Avascular necrosis   . Leukocytosis     Chronic  . Thrombocytosis     Chronic   Past Surgical History  Procedure Date  . Right hip replacement     08/2006  . Cholecystectomy     01/2008  . Porta cath placement   . Porta cath removal   . Umbilical hernia repair     01/2008  . Excision of left periauricular cyst     10/2009  . Excision of right ear lobe cyst with primary closur     11/2007   Family History  Problem Relation Age of Onset  . Sickle cell anemia Mother   . Sickle cell anemia Father   . Sickle cell trait Brother    History   Social History  . Marital Status: Single    Spouse Name: N/A    Number of Children: 0  . Years of Education: 13   Occupational History  . Unemployed    Social History Main Topics  . Smoking status: Former Smoker -- 13 years    Types: Cigarettes    Quit  date: 07/08/2010  . Smokeless tobacco: Never Used  . Alcohol Use: No  . Drug Use: No     quit smoking 2011  . Sexually Active: Yes    Birth Control/ Protection: None   Other Topics Concern  . Not on file   Social History Narrative   Single.  Lives with girlfriend.      Review of Systems: As noted in the HPI  Physical Exam: Blood pressure 127/76, pulse 88, temperature 99.3 F (37.4 C), temperature source Oral, resp. rate 22, weight 74.844 kg (165 lb), SpO2 96.00%.  General Appearance: Alert and oriented, cooperative, well developed, well nourished, in no acute distress  Head: Normocephalic, without obvious abnormality, atraumatic  Eyes: PERRL, slight strabismus, mild icteric sclera  Nose: Nares normal, septum midline, mucosa normal, no drainage or sinus tenderness.  Throat: Normal lips, mucosa, tongue and gums  Back: Symmetric, mild CVA/  tenderness, lower back tenderness  Resp: even and nonlabored, no wheezes/rales/rhonchi    Cardio: S1, S2 regular, no murmurs/clicks/rubs/gallops  GI: Soft, non tender, active bowel sounds, no masses, no organomegaly appreciated  Male Genitalia: Deferred, the patient denies priapism  Extremities: Extremities normal, atraumatic, no cyanosis or edema, negative Homans sign, no sign of DVT, no tenderness  Pulses: 2+ and symmetric  Skin: Skin color, texture, turgor normal, no rashes or lesions, numerous tattoos  Neurologic: Grossly normal, CN II-XII intact, no focal deficits  Psych: Appropriate affect    Lab results: No results found for this or any previous visit (from the past 24 hour(s)).  Imaging results:  No results found.  Assessment & Plan:  Sickle cell crisis- SS Disease: No  Hydrea and Folic Acid;  Abdominal Pain- nonfocal and diffuse, occuring only in early am with PMH s/p cholecystectomy and umbilical hermia repair. If persists, Abdominal ultrasound.  Acute/Chronic pain syndrome: resume home long-acting Oxycontin BID, and prn Motrin; use IV Dilaudid severe breakthrough pain  Hx Hypokalemia-will evaluate when labatory data resume home meds and follow    8) Disposition- discharge after 23 hours observation if able to manage his pain at home  [name] P 11/12/2011, 10:42 PM

## 2011-11-12 NOTE — Progress Notes (Signed)
Unable to access IV with 2 nurse attempts and 2 IV team attempts; NP notified; PO and IM medications ordered; will continue to monitor

## 2011-11-12 NOTE — Progress Notes (Addendum)
Pt states still has pain of 9/10 in right arm and right rib cage; denies chest pain; tolerating IM injections well; will continue to monitor Pt refuses PO Dilaudid for pain control

## 2011-11-13 ENCOUNTER — Non-Acute Institutional Stay (HOSPITAL_COMMUNITY)
Admission: AD | Admit: 2011-11-13 | Discharge: 2011-11-14 | Disposition: A | Discharge status: Home / Self Care | Payer: Medicare Other | Attending: Internal Medicine | Admitting: Internal Medicine

## 2011-11-13 ENCOUNTER — Encounter (HOSPITAL_COMMUNITY): Payer: Self-pay

## 2011-11-13 DIAGNOSIS — G894 Chronic pain syndrome: Secondary | ICD-10-CM | POA: Insufficient documentation

## 2011-11-13 DIAGNOSIS — F39 Unspecified mood [affective] disorder: Secondary | ICD-10-CM | POA: Insufficient documentation

## 2011-11-13 DIAGNOSIS — D5700 Hb-SS disease with crisis, unspecified: Secondary | ICD-10-CM | POA: Insufficient documentation

## 2011-11-13 DIAGNOSIS — Z79899 Other long term (current) drug therapy: Secondary | ICD-10-CM | POA: Diagnosis not present

## 2011-11-13 DIAGNOSIS — R109 Unspecified abdominal pain: Secondary | ICD-10-CM | POA: Diagnosis not present

## 2011-11-13 DIAGNOSIS — D57819 Other sickle-cell disorders with crisis, unspecified: Secondary | ICD-10-CM | POA: Diagnosis not present

## 2011-11-13 DIAGNOSIS — Z86711 Personal history of pulmonary embolism: Secondary | ICD-10-CM | POA: Insufficient documentation

## 2011-11-13 DIAGNOSIS — Z96649 Presence of unspecified artificial hip joint: Secondary | ICD-10-CM | POA: Diagnosis not present

## 2011-11-13 DIAGNOSIS — M79609 Pain in unspecified limb: Secondary | ICD-10-CM | POA: Diagnosis not present

## 2011-11-13 DIAGNOSIS — R52 Pain, unspecified: Secondary | ICD-10-CM | POA: Insufficient documentation

## 2011-11-13 LAB — CBC WITH DIFFERENTIAL/PLATELET
Eosinophils Relative: 4 % (ref 0–5)
HCT: 23.1 % — ABNORMAL LOW (ref 39.0–52.0)
Lymphs Abs: 3.7 10*3/uL (ref 0.7–4.0)
MCV: 89.5 fL (ref 78.0–100.0)
Monocytes Relative: 15 % — ABNORMAL HIGH (ref 3–12)
Neutro Abs: 9.7 10*3/uL — ABNORMAL HIGH (ref 1.7–7.7)
Platelets: 557 10*3/uL — ABNORMAL HIGH (ref 150–400)
RBC: 2.58 MIL/uL — ABNORMAL LOW (ref 4.22–5.81)
WBC: 16.8 10*3/uL — ABNORMAL HIGH (ref 4.0–10.5)

## 2011-11-13 LAB — COMPREHENSIVE METABOLIC PANEL
ALT: 27 U/L (ref 0–53)
Albumin: 3.9 g/dL (ref 3.5–5.2)
Alkaline Phosphatase: 89 U/L (ref 39–117)
Potassium: 3.2 mEq/L — ABNORMAL LOW (ref 3.5–5.1)
Sodium: 138 mEq/L (ref 135–145)
Total Protein: 7.3 g/dL (ref 6.0–8.3)

## 2011-11-13 LAB — CK: Total CK: 23 U/L (ref 7–232)

## 2011-11-13 LAB — AMMONIA: Ammonia: 76 umol/L — ABNORMAL HIGH (ref 11–60)

## 2011-11-13 MED ORDER — OXYCODONE HCL 80 MG PO TB12
80.0000 mg | ORAL_TABLET | Freq: Two times a day (BID) | ORAL | Status: DC
  Administered 2011-11-13 – 2011-11-14 (×2): 80 mg via ORAL
  Filled 2011-11-13 (×2): qty 1

## 2011-11-13 MED ORDER — HYDROMORPHONE HCL PF 2 MG/ML IJ SOLN
2.0000 mg | INTRAMUSCULAR | Status: DC | PRN
  Administered 2011-11-13 – 2011-11-14 (×7): 4 mg via INTRAVENOUS
  Administered 2011-11-14: 2 mg via INTRAVENOUS
  Administered 2011-11-14: 4 mg via INTRAVENOUS
  Administered 2011-11-14: 2 mg via INTRAVENOUS
  Administered 2011-11-14: 4 mg via INTRAVENOUS
  Administered 2011-11-14: 2 mg via INTRAVENOUS
  Filled 2011-11-13: qty 1
  Filled 2011-11-13 (×3): qty 2
  Filled 2011-11-13: qty 1
  Filled 2011-11-13 (×3): qty 2
  Filled 2011-11-13: qty 1
  Filled 2011-11-13 (×3): qty 2

## 2011-11-13 MED ORDER — DEXTROSE-NACL 5-0.45 % IV SOLN: INTRAVENOUS | Status: DC

## 2011-11-13 MED ORDER — POTASSIUM CHLORIDE CRYS ER 20 MEQ PO TBCR
40.0000 meq | EXTENDED_RELEASE_TABLET | Freq: Once | ORAL | Status: AC
  Administered 2011-11-13: 23:00:00 via ORAL
  Filled 2011-11-13: qty 1

## 2011-11-13 MED ORDER — DIPHENHYDRAMINE HCL 25 MG PO CAPS
25.0000 mg | ORAL_CAPSULE | ORAL | Status: DC | PRN
  Filled 2011-11-13: qty 1

## 2011-11-13 MED ORDER — ONDANSETRON HCL 4 MG PO TABS
4.0000 mg | ORAL_TABLET | ORAL | Status: DC | PRN
  Filled 2011-11-13: qty 1

## 2011-11-13 MED ORDER — FOLIC ACID 1 MG PO TABS
1.0000 mg | ORAL_TABLET | Freq: Every day | ORAL | Status: DC
  Administered 2011-11-14: 1 mg via ORAL
  Filled 2011-11-13: qty 1

## 2011-11-13 MED ORDER — ONDANSETRON HCL 4 MG/2ML IJ SOLN
4.0000 mg | INTRAMUSCULAR | Status: DC | PRN
  Administered 2011-11-13 – 2011-11-14 (×6): 4 mg via INTRAVENOUS
  Filled 2011-11-13 (×6): qty 2

## 2011-11-13 MED ORDER — KCL IN DEXTROSE-NACL 20-5-0.45 MEQ/L-%-% IV SOLN
INTRAVENOUS | Status: DC
  Administered 2011-11-13 – 2011-11-14 (×3): via INTRAVENOUS
  Filled 2011-11-13 (×5): qty 1000

## 2011-11-13 MED ORDER — HYDROXYUREA 500 MG PO CAPS
500.0000 mg | ORAL_CAPSULE | Freq: Two times a day (BID) | ORAL | Status: DC
  Administered 2011-11-13 – 2011-11-14 (×2): 500 mg via ORAL
  Filled 2011-11-13 (×2): qty 1

## 2011-11-13 MED ORDER — DIPHENHYDRAMINE HCL 50 MG/ML IJ SOLN
12.5000 mg | INTRAMUSCULAR | Status: DC | PRN
  Administered 2011-11-13 – 2011-11-14 (×6): 25 mg via INTRAVENOUS
  Filled 2011-11-13 (×6): qty 1

## 2011-11-13 MED ORDER — POTASSIUM CHLORIDE CRYS ER 20 MEQ PO TBCR
20.0000 meq | EXTENDED_RELEASE_TABLET | Freq: Two times a day (BID) | ORAL | Status: DC
  Administered 2011-11-13 – 2011-11-14 (×2): 20 meq via ORAL
  Filled 2011-11-13 (×2): qty 1

## 2011-11-13 MED ORDER — DOCUSATE SODIUM 100 MG PO CAPS
100.0000 mg | ORAL_CAPSULE | Freq: Two times a day (BID) | ORAL | Status: DC
  Administered 2011-11-13 – 2011-11-14 (×2): 100 mg via ORAL
  Filled 2011-11-13 (×2): qty 1

## 2011-11-13 MED ORDER — OLANZAPINE 10 MG PO TABS
10.0000 mg | ORAL_TABLET | Freq: Every day | ORAL | Status: DC
  Administered 2011-11-13: 10 mg via ORAL
  Filled 2011-11-13 (×2): qty 1

## 2011-11-13 MED ORDER — TEMAZEPAM 7.5 MG PO CAPS
7.5000 mg | ORAL_CAPSULE | Freq: Every evening | ORAL | Status: DC | PRN
  Administered 2011-11-13: 7.5 mg via ORAL
  Filled 2011-11-13: qty 1

## 2011-11-13 MED ORDER — IBUPROFEN 800 MG PO TABS: 800.0000 mg | ORAL_TABLET | Freq: Three times a day (TID) | ORAL | Status: DC | PRN

## 2011-11-13 NOTE — Progress Notes (Signed)
Patient ID: [name], male   DOB: 12-09-1980, 31 y.o.   MRN: [id number] Pt given discharge instructions; all questions answered; NP ordered to leave peripheral IV in place; IV saline locked; no complications noted

## 2011-11-13 NOTE — Progress Notes (Signed)
Pt admitted to [facility] cell center; IV intact; flushed with no complications noted; pain level 7 out of 10; pain in right arm and rib cage; will continue to monitor

## 2011-11-13 NOTE — Discharge Planning (Cosign Needed)
[facility] Discharge Summary   Patient ID: [name] MRN: [id number] DOB/AGE: Jan 27, 1981 31 y.o.  Admit date: 11/12/2011 Discharge date: 11/13/2011  Primary Care Physician:  [name], MD  Admission Diagnoses:  Sickle cell crisis Acute/chronic pain Hypokalemia   Discharge Diagnoses:  Sickle cell crisis Acute/chronic pain Hypokalemia  Sickle cell crisis  Discharge Medications:    Medication List     As of 11/13/2011 10:34 AM    ASK your doctor about these medications         folic acid 1 MG tablet   Commonly known as: FOLVITE   Take 1 mg by mouth daily.      HYDROmorphone 4 MG tablet   Commonly known as: DILAUDID   Take 1 tablet (4 mg total) by mouth every 4 (four) hours as needed. For pain.      hydroxyurea 500 MG capsule   Commonly known as: HYDREA   Take 500 mg by mouth 2 (two) times daily. May take with food to minimize GI side effects.      ibuprofen 800 MG tablet   Commonly known as: ADVIL,MOTRIN   Take 800 mg by mouth every 8 (eight) hours as needed. For pain.      OLANZapine 10 MG tablet   Commonly known as: ZYPREXA   Take 1 tablet (10 mg total) by mouth at bedtime.      oxyCODONE 80 MG 12 hr tablet   Commonly known as: OXYCONTIN   Take 1 tablet (80 mg total) by mouth every 12 (twelve) hours.      potassium chloride SA 20 MEQ tablet   Commonly known as: K-DUR,KLOR-CON   Take 1 tablet (20 mEq total) by mouth 2 (two) times daily.      temazepam 7.5 MG capsule   Commonly known as: RESTORIL   Take 1 capsule (7.5 mg total) by mouth at bedtime as needed for sleep.       Consults:  None  Significant Diagnostic Studies:  Dg Chest 2 View  11/03/2011  *RADIOLOGY REPORT*  Clinical Data: 31 year old male rib pain and sickle cell disease.  CHEST - 2 VIEW  Comparison: 10/02/2011 and earlier.  Findings: Decreased cardiac size. Other mediastinal contours are within normal limits.  Visualized tracheal air column is within normal  limits.  Decreased pulmonary interstitial markings/vascularity.  No pleural effusion.  No confluent or acute pulmonary opacity.  No pneumothorax or edema. Stable visualized osseous structures.  Right upper quadrant surgical clips.  IMPRESSION: No acute cardiopulmonary abnormality.   Original Report Authenticated By: Harley Hallmark, M.D.    Ir Fluoro Guide Cv Line Right  11/05/2011  *RADIOLOGY REPORT*  Clinical Data: Sickle cell crisis and need for central venous access.  PICC LINE PLACEMENT WITH ULTRASOUND AND FLUOROSCOPIC  GUIDANCE  Fluoroscopy Time: 0.1 minutes.  The right arm was prepped with chlorhexidine, draped in the usual sterile fashion using maximum barrier technique (cap and mask, sterile gown, sterile gloves, large sterile sheet, hand hygiene and cutaneous antisepsis) and infiltrated locally with 1% Lidocaine.  Ultrasound demonstrated patency of the right basilic vein, and this was documented with an image.  Under real-time ultrasound guidance, this vein was accessed with a 21 gauge micropuncture needle and image documentation was performed.  The needle was exchanged over a guidewire for a peel-away sheath through which a 5 [place] dual lumen PICC trimmed to 36 cm was advanced, positioned with its tip at the lower SVC/right atrial junction.  Fluoroscopy during the procedure  and fluoro spot radiograph confirms appropriate catheter position.  The catheter was flushed, secured to the skin with Prolene sutures, and covered with a sterile dressing.  Complications:  None  IMPRESSION: Successful right arm PICC line placement with ultrasound and fluoroscopic guidance.  The catheter is ready for use.   Original Report Authenticated By: Reola Calkins, M.D.    Ir US Guide Vasc Access Right  11/05/2011  *RADIOLOGY REPORT*  Clinical Data: Sickle cell crisis and need for central venous access.  PICC LINE PLACEMENT WITH ULTRASOUND AND FLUOROSCOPIC  GUIDANCE  Fluoroscopy Time: 0.1 minutes.  The right arm was  prepped with chlorhexidine, draped in the usual sterile fashion using maximum barrier technique (cap and mask, sterile gown, sterile gloves, large sterile sheet, hand hygiene and cutaneous antisepsis) and infiltrated locally with 1% Lidocaine.  Ultrasound demonstrated patency of the right basilic vein, and this was documented with an image.  Under real-time ultrasound guidance, this vein was accessed with a 21 gauge micropuncture needle and image documentation was performed.  The needle was exchanged over a guidewire for a peel-away sheath through which a 5 [place] dual lumen PICC trimmed to 36 cm was advanced, positioned with its tip at the lower SVC/right atrial junction.  Fluoroscopy during the procedure and fluoro spot radiograph confirms appropriate catheter position.  The catheter was flushed, secured to the skin with Prolene sutures, and covered with a sterile dressing.  Complications:  None  IMPRESSION: Successful right arm PICC line placement with ultrasound and fluoroscopic guidance.  The catheter is ready for use.   Original Report Authenticated By: Reola Calkins, M.D.      [facility] Course: For complete details please refer to admission H and P, but in brief, Mr. [name] is a 31 y.o. African American male continues to complain  of right rib cage and right arm pain but will try to manage pain at him.  He does not want to be admitted to in patient service. Initially, he was given po medication and IM later a floor nurse came over and was able to obtain IV access labs were drawn and determine he was in active crisis and was hypokalemic replacement was give and d/c wnl. Pain management was debatable but willing to try. He was treated IVF for hydration pain/antiemetic and antipruitics during observation at time of d/c he rated his pain as 5/10.  Physical Exam at Discharge:  BP 118/67  Pulse 83  Temp 99 F (37.2 C) (Oral)  Resp 18  Wt 74.844 kg (165 lb)  SpO2  95%  General Appearance: Alert and oriented, cooperative, well developed, well nourished, in no acute distress  Resp: even and nonlabored, no wheezes/rales/rhonchi  Cardio: S1, S2 regular, no murmurs/clicks/rubs/gallops  GI: Soft, non tender, active bowel sounds, no masses, no organomegaly appreciated  Male Genitalia: Deferred, the patient denies priapism  Extremities: Extremities normal, atraumatic, no cyanosis or edema, negative Homans sign, no sign of DVT, no tenderness  Psych: Appropriate affect   Disposition at Discharge: 01-Home and  Self Care  Discharge Orders:   Condition at Discharge:  Stable   Time spent on Discharge:  Greater than 30 minutes.  Signed: Shervin Cypert P 11/13/2011, 10:34 AM

## 2011-11-14 LAB — CBC
HCT: 23.9 % — ABNORMAL LOW (ref 39.0–52.0)
Platelets: 549 10*3/uL — ABNORMAL HIGH (ref 150–400)
RDW: 25.1 % — ABNORMAL HIGH (ref 11.5–15.5)
WBC: 18.3 10*3/uL — ABNORMAL HIGH (ref 4.0–10.5)

## 2011-11-14 LAB — COMPREHENSIVE METABOLIC PANEL
ALT: 26 U/L (ref 0–53)
AST: 38 U/L — ABNORMAL HIGH (ref 0–37)
Albumin: 3.8 g/dL (ref 3.5–5.2)
Alkaline Phosphatase: 90 U/L (ref 39–117)
BUN: 3 mg/dL — ABNORMAL LOW (ref 6–23)
Chloride: 106 mEq/L (ref 96–112)
Potassium: 3.8 mEq/L (ref 3.5–5.1)
Sodium: 138 mEq/L (ref 135–145)
Total Bilirubin: 3.6 mg/dL — ABNORMAL HIGH (ref 0.3–1.2)

## 2011-11-14 MED ORDER — AZITHROMYCIN 250 MG PO TABS: ORAL_TABLET | ORAL | Status: AC

## 2011-11-14 MED ORDER — AZITHROMYCIN 250 MG PO TABS: ORAL_TABLET | ORAL | Status: DC

## 2011-11-14 NOTE — Discharge Summary (Signed)
[facility] Discharge Summary   Patient ID: [name] MRN: [id number] DOB/AGE: 1980-05-20 31 y.o.  Admit date: 11/13/2011 Discharge date: 11/14/2011  Primary Care Physician:  [name], MD  Admission Diagnoses:  Sickle cell crisis  Acute on chronic pain  Mood disorder   Discharge Diagnoses:   Sickle cell crisis  Acute on chronic pain  Mood disorder   Discharge Medications:    Medication List     As of 11/14/2011 11:07 AM    ASK your doctor about these medications         folic acid 1 MG tablet   Commonly known as: FOLVITE   Take 1 mg by mouth daily.      HYDROmorphone 4 MG tablet   Commonly known as: DILAUDID   Take 1 tablet (4 mg total) by mouth every 4 (four) hours as needed. For pain.      hydroxyurea 500 MG capsule   Commonly known as: HYDREA   Take 500 mg by mouth 2 (two) times daily. May take with food to minimize GI side effects.      ibuprofen 800 MG tablet   Commonly known as: ADVIL,MOTRIN   Take 800 mg by mouth every 8 (eight) hours as needed. For pain.      OLANZapine 10 MG tablet   Commonly known as: ZYPREXA   Take 1 tablet (10 mg total) by mouth at bedtime.      oxyCODONE 80 MG 12 hr tablet   Commonly known as: OXYCONTIN   Take 1 tablet (80 mg total) by mouth every 12 (twelve) hours.      potassium chloride SA 20 MEQ tablet   Commonly known as: K-DUR,KLOR-CON   Take 1 tablet (20 mEq total) by mouth 2 (two) times daily.      temazepam 7.5 MG capsule   Commonly known as: RESTORIL   Take 1 capsule (7.5 mg total) by mouth at bedtime as needed for sleep.         Consults:  None   Significant Diagnostic Studies:  Dg Chest 2 View  11/03/2011  *RADIOLOGY REPORT*  Clinical Data: 31 year old male rib pain and sickle cell disease.  CHEST - 2 VIEW  Comparison: 10/02/2011 and earlier.  Findings: Decreased cardiac size. Other mediastinal contours are within normal limits.  Visualized tracheal air column is within  normal limits.  Decreased pulmonary interstitial markings/vascularity.  No pleural effusion.  No confluent or acute pulmonary opacity.  No pneumothorax or edema. Stable visualized osseous structures.  Right upper quadrant surgical clips.  IMPRESSION: No acute cardiopulmonary abnormality.   Original Report Authenticated By: Harley Hallmark, M.D.    Ir Fluoro Guide Cv Line Right  11/05/2011  *RADIOLOGY REPORT*  Clinical Data: Sickle cell crisis and need for central venous access.  PICC LINE PLACEMENT WITH ULTRASOUND AND FLUOROSCOPIC  GUIDANCE  Fluoroscopy Time: 0.1 minutes.  The right arm was prepped with chlorhexidine, draped in the usual sterile fashion using maximum barrier technique (cap and mask, sterile gown, sterile gloves, large sterile sheet, hand hygiene and cutaneous antisepsis) and infiltrated locally with 1% Lidocaine.  Ultrasound demonstrated patency of the right basilic vein, and this was documented with an image.  Under real-time ultrasound guidance, this vein was accessed with a 21 gauge micropuncture needle and image documentation was performed.  The needle was exchanged over a guidewire for a peel-away sheath through which a 5 [place] dual lumen PICC trimmed to 36 cm was advanced, positioned with its tip  at the lower SVC/right atrial junction.  Fluoroscopy during the procedure and fluoro spot radiograph confirms appropriate catheter position.  The catheter was flushed, secured to the skin with Prolene sutures, and covered with a sterile dressing.  Complications:  None  IMPRESSION: Successful right arm PICC line placement with ultrasound and fluoroscopic guidance.  The catheter is ready for use.   Original Report Authenticated By: Reola Calkins, M.D.    Ir US Guide Vasc Access Right  11/05/2011  *RADIOLOGY REPORT*  Clinical Data: Sickle cell crisis and need for central venous access.  PICC LINE PLACEMENT WITH ULTRASOUND AND FLUOROSCOPIC  GUIDANCE  Fluoroscopy Time: 0.1 minutes.  The right arm  was prepped with chlorhexidine, draped in the usual sterile fashion using maximum barrier technique (cap and mask, sterile gown, sterile gloves, large sterile sheet, hand hygiene and cutaneous antisepsis) and infiltrated locally with 1% Lidocaine.  Ultrasound demonstrated patency of the right basilic vein, and this was documented with an image.  Under real-time ultrasound guidance, this vein was accessed with a 21 gauge micropuncture needle and image documentation was performed.  The needle was exchanged over a guidewire for a peel-away sheath through which a 5 [place] dual lumen PICC trimmed to 36 cm was advanced, positioned with its tip at the lower SVC/right atrial junction.  Fluoroscopy during the procedure and fluoro spot radiograph confirms appropriate catheter position.  The catheter was flushed, secured to the skin with Prolene sutures, and covered with a sterile dressing.  Complications:  None  IMPRESSION: Successful right arm PICC line placement with ultrasound and fluoroscopic guidance.  The catheter is ready for use.   Original Report Authenticated By: Reola Calkins, M.D.      [facility] Course:  For complete details please refer to admission H and P, but in brief, [name] is a 31 y.o. African American male with SS Sickle Cell Disease was admitted to [facility] for treatment he found his self unable to manage his pain with his home medication and his pain became progressively worst. He returned later in the day for ongoing treatment. He was treated aggressively with IV fluids for hydration, pain, antiemetic and antineuritis management Discharge with a pain rating of 3/10 feels he will try to manage his pain at home on his current regiment.   Physical Exam at Discharge:  BP 134/85  Pulse 87  Temp 98.9 F (37.2 C) (Oral)  Resp 16  SpO2 98%  General Appearance: Alert and oriented, cooperative, well developed, well nourished, in no acute distress  Resp: even and  nonlabored, no wheezes/rales/rhonchi  Cardio: S1, S2 regular, no murmurs/clicks/rubs/gallops  GI: Soft, non tender, active bowel sounds, no masses, no organomegaly appreciated  Male Genitalia: Deferred, the patient denies priapism  Extremities: Extremities normal, atraumatic, no cyanosis or edema, negative Homans sign, no sign of DVT, no tenderness  Psych: Appropriate affect    Disposition at Discharge: 01-Home or Self Care  Discharge Orders:   Condition at Discharge:   Stable  Time spent on Discharge:  Greater than 30 minutes.  Signed: Zoelle Markus P 11/14/2011, 11:07 AM

## 2011-11-14 NOTE — Progress Notes (Signed)
Patient ID: [name], male   DOB: Sep 13, 1980, 31 y.o.   MRN: [id number] Patient D/C from sickle cell unit via ambulatory, vitals stable, no complications, discharge instructions given, follow-up appt. Made.

## 2011-11-21 ENCOUNTER — Emergency Department (HOSPITAL_COMMUNITY): Payer: Medicare Other

## 2011-11-21 ENCOUNTER — Encounter (HOSPITAL_COMMUNITY): Payer: Self-pay | Admitting: *Deleted

## 2011-11-21 ENCOUNTER — Inpatient Hospital Stay (HOSPITAL_COMMUNITY)
Admission: EM | Admit: 2011-11-21 | Discharge: 2011-11-29 | DRG: 812 | Disposition: A | Discharge status: Home / Self Care | Payer: Medicare Other | Attending: Internal Medicine | Admitting: Internal Medicine

## 2011-11-21 ENCOUNTER — Other Ambulatory Visit: Payer: Self-pay

## 2011-11-21 DIAGNOSIS — D72829 Elevated white blood cell count, unspecified: Secondary | ICD-10-CM | POA: Diagnosis not present

## 2011-11-21 DIAGNOSIS — M8700 Idiopathic aseptic necrosis of unspecified bone: Secondary | ICD-10-CM | POA: Diagnosis not present

## 2011-11-21 DIAGNOSIS — I10 Essential (primary) hypertension: Secondary | ICD-10-CM | POA: Diagnosis not present

## 2011-11-21 DIAGNOSIS — I82C19 Acute embolism and thrombosis of unspecified internal jugular vein: Secondary | ICD-10-CM

## 2011-11-21 DIAGNOSIS — I82C11 Acute embolism and thrombosis of right internal jugular vein: Secondary | ICD-10-CM

## 2011-11-21 DIAGNOSIS — G4700 Insomnia, unspecified: Secondary | ICD-10-CM | POA: Diagnosis not present

## 2011-11-21 DIAGNOSIS — D5700 Hb-SS disease with crisis, unspecified: Secondary | ICD-10-CM | POA: Diagnosis not present

## 2011-11-21 DIAGNOSIS — G8929 Other chronic pain: Secondary | ICD-10-CM | POA: Diagnosis not present

## 2011-11-21 DIAGNOSIS — R079 Chest pain, unspecified: Secondary | ICD-10-CM | POA: Diagnosis not present

## 2011-11-21 DIAGNOSIS — Z452 Encounter for adjustment and management of vascular access device: Secondary | ICD-10-CM | POA: Diagnosis not present

## 2011-11-21 DIAGNOSIS — D5701 Hb-SS disease with acute chest syndrome: Secondary | ICD-10-CM

## 2011-11-21 DIAGNOSIS — R0602 Shortness of breath: Secondary | ICD-10-CM | POA: Diagnosis not present

## 2011-11-21 DIAGNOSIS — R0789 Other chest pain: Secondary | ICD-10-CM | POA: Diagnosis not present

## 2011-11-21 DIAGNOSIS — E876 Hypokalemia: Secondary | ICD-10-CM | POA: Diagnosis present

## 2011-11-21 DIAGNOSIS — D571 Sickle-cell disease without crisis: Secondary | ICD-10-CM

## 2011-11-21 DIAGNOSIS — F39 Unspecified mood [affective] disorder: Secondary | ICD-10-CM | POA: Diagnosis not present

## 2011-11-21 LAB — RAPID URINE DRUG SCREEN, HOSP PERFORMED
Barbiturates: NOT DETECTED
Benzodiazepines: NOT DETECTED
Cocaine: NOT DETECTED
Tetrahydrocannabinol: POSITIVE — AB

## 2011-11-21 LAB — CBC WITH DIFFERENTIAL/PLATELET
Basophils Absolute: 0.3 10*3/uL — ABNORMAL HIGH (ref 0.0–0.1)
Eosinophils Absolute: 0 10*3/uL (ref 0.0–0.7)
HCT: 25.9 % — ABNORMAL LOW (ref 39.0–52.0)
Lymphocytes Relative: 4 % — ABNORMAL LOW (ref 12–46)
Lymphs Abs: 1 10*3/uL (ref 0.7–4.0)
MCH: 30.2 pg (ref 26.0–34.0)
MCHC: 34.4 g/dL (ref 30.0–36.0)
MCV: 87.8 fL (ref 78.0–100.0)
Monocytes Absolute: 3.3 10*3/uL — ABNORMAL HIGH (ref 0.1–1.0)
Neutro Abs: 21 10*3/uL — ABNORMAL HIGH (ref 1.7–7.7)
RDW: 22.7 % — ABNORMAL HIGH (ref 11.5–15.5)

## 2011-11-21 LAB — COMPREHENSIVE METABOLIC PANEL
BUN: 5 mg/dL — ABNORMAL LOW (ref 6–23)
CO2: 20 mEq/L (ref 19–32)
Calcium: 9.3 mg/dL (ref 8.4–10.5)
Creatinine, Ser: 0.61 mg/dL (ref 0.50–1.35)
GFR calc Af Amer: >90 mL/min (ref >90)
GFR calc non Af Amer: >90 mL/min (ref >90)
Glucose, Bld: 115 mg/dL — ABNORMAL HIGH (ref 70–99)
Total Protein: 8.6 g/dL — ABNORMAL HIGH (ref 6.0–8.3)

## 2011-11-21 LAB — URINALYSIS, ROUTINE W REFLEX MICROSCOPIC
Leukocytes, UA: NEGATIVE
Nitrite: NEGATIVE
Specific Gravity, Urine: >1.046 — ABNORMAL HIGH (ref 1.005–1.030)
Urobilinogen, UA: 2 mg/dL — ABNORMAL HIGH (ref 0.0–1.0)
pH: 7.5 (ref 5.0–8.0)

## 2011-11-21 LAB — BASIC METABOLIC PANEL
Calcium: 8.9 mg/dL (ref 8.4–10.5)
Chloride: 106 mEq/L (ref 96–112)
Creatinine, Ser: 0.55 mg/dL (ref 0.50–1.35)
GFR calc Af Amer: >90 mL/min (ref >90)

## 2011-11-21 LAB — MAGNESIUM: Magnesium: 1.7 mg/dL (ref 1.5–2.5)

## 2011-11-21 MED ORDER — IOHEXOL 350 MG/ML SOLN
80.0000 mL | Freq: Once | INTRAVENOUS | Status: AC | PRN
  Administered 2011-11-21: 80 mL via INTRAVENOUS

## 2011-11-21 MED ORDER — HYDROMORPHONE HCL PF 2 MG/ML IJ SOLN
2.0000 mg | INTRAMUSCULAR | Status: DC | PRN
  Administered 2011-11-21: 2 mg via INTRAVENOUS
  Filled 2011-11-21 (×2): qty 1

## 2011-11-21 MED ORDER — IOHEXOL 300 MG/ML  SOLN
100.0000 mL | Freq: Once | INTRAMUSCULAR | Status: AC | PRN
  Administered 2011-11-21: 100 mL via INTRAVENOUS

## 2011-11-21 MED ORDER — SODIUM CHLORIDE 0.45 % IV SOLN
INTRAVENOUS | Status: DC
  Administered 2011-11-21: 23:00:00 via INTRAVENOUS
  Administered 2011-11-22: 100 mL/h via INTRAVENOUS

## 2011-11-21 MED ORDER — PROMETHAZINE HCL 12.5 MG RE SUPP
12.5000 mg | RECTAL | Status: DC | PRN
  Filled 2011-11-21: qty 2

## 2011-11-21 MED ORDER — HYDROMORPHONE HCL PF 1 MG/ML IJ SOLN: 1.0000 mg | Freq: Once | INTRAMUSCULAR | Status: DC

## 2011-11-21 MED ORDER — HYDROMORPHONE HCL PF 2 MG/ML IJ SOLN
2.0000 mg | Freq: Once | INTRAMUSCULAR | Status: AC
  Administered 2011-11-21: 2 mg via INTRAMUSCULAR
  Filled 2011-11-21: qty 1

## 2011-11-21 MED ORDER — SODIUM CHLORIDE 0.9 % IV SOLN
INTRAVENOUS | Status: DC
  Administered 2011-11-21: 18:00:00 via INTRAVENOUS

## 2011-11-21 MED ORDER — TEMAZEPAM 7.5 MG PO CAPS: 7.5000 mg | ORAL_CAPSULE | Freq: Every evening | ORAL | Status: DC | PRN

## 2011-11-21 MED ORDER — HYDROXYUREA 500 MG PO CAPS
500.0000 mg | ORAL_CAPSULE | Freq: Two times a day (BID) | ORAL | Status: DC
  Administered 2011-11-21 – 2011-11-29 (×16): 500 mg via ORAL
  Filled 2011-11-21 (×17): qty 1

## 2011-11-21 MED ORDER — PROMETHAZINE HCL 25 MG PO TABS
12.5000 mg | ORAL_TABLET | ORAL | Status: DC | PRN
  Administered 2011-11-22: 25 mg via ORAL
  Filled 2011-11-21 (×2): qty 1

## 2011-11-21 MED ORDER — POTASSIUM CHLORIDE CRYS ER 20 MEQ PO TBCR
40.0000 meq | EXTENDED_RELEASE_TABLET | Freq: Once | ORAL | Status: AC
  Administered 2011-11-21: 40 meq via ORAL
  Filled 2011-11-21: qty 2

## 2011-11-21 MED ORDER — DIPHENHYDRAMINE HCL 50 MG/ML IJ SOLN
12.5000 mg | INTRAMUSCULAR | Status: DC | PRN
  Administered 2011-11-21 – 2011-11-29 (×43): 25 mg via INTRAVENOUS
  Filled 2011-11-21 (×43): qty 1

## 2011-11-21 MED ORDER — HYDROMORPHONE HCL PF 1 MG/ML IJ SOLN
2.0000 mg | Freq: Once | INTRAMUSCULAR | Status: AC
  Administered 2011-11-21: 2 mg via INTRAVENOUS
  Filled 2011-11-21: qty 2

## 2011-11-21 MED ORDER — ENOXAPARIN SODIUM 30 MG/0.3ML ~~LOC~~ SOLN
30.0000 mg | Freq: Two times a day (BID) | SUBCUTANEOUS | Status: DC
  Administered 2011-11-21 – 2011-11-29 (×16): 30 mg via SUBCUTANEOUS
  Filled 2011-11-21 (×18): qty 0.3

## 2011-11-21 MED ORDER — HYDROMORPHONE HCL PF 2 MG/ML IJ SOLN: 2.0000 mg | Freq: Once | INTRAMUSCULAR | Status: DC

## 2011-11-21 MED ORDER — HYDROMORPHONE HCL PF 2 MG/ML IJ SOLN
2.0000 mg | INTRAMUSCULAR | Status: DC | PRN
  Administered 2011-11-21 – 2011-11-22 (×4): 4 mg via INTRAVENOUS
  Administered 2011-11-22: 2 mg via INTRAVENOUS
  Administered 2011-11-22 (×3): 4 mg via INTRAVENOUS
  Filled 2011-11-21: qty 2
  Filled 2011-11-21: qty 4
  Filled 2011-11-21 (×2): qty 2
  Filled 2011-11-21: qty 1
  Filled 2011-11-21: qty 2
  Filled 2011-11-21 (×2): qty 1
  Filled 2011-11-21 (×2): qty 2

## 2011-11-21 MED ORDER — OXYCODONE HCL ER 10 MG PO T12A
80.0000 mg | EXTENDED_RELEASE_TABLET | Freq: Two times a day (BID) | ORAL | Status: DC
  Administered 2011-11-21 – 2011-11-29 (×16): 80 mg via ORAL
  Filled 2011-11-21 (×5): qty 8
  Filled 2011-11-21: qty 7
  Filled 2011-11-21 (×3): qty 8
  Filled 2011-11-21: qty 1
  Filled 2011-11-21: qty 8
  Filled 2011-11-21: qty 1
  Filled 2011-11-21 (×6): qty 8

## 2011-11-21 MED ORDER — FOLIC ACID 1 MG PO TABS: 1.0000 mg | ORAL_TABLET | Freq: Every day | ORAL | Status: DC

## 2011-11-21 MED ORDER — DIPHENHYDRAMINE HCL 25 MG PO CAPS
25.0000 mg | ORAL_CAPSULE | ORAL | Status: DC | PRN
  Administered 2011-11-22: 25 mg via ORAL
  Filled 2011-11-21: qty 1

## 2011-11-21 MED ORDER — HYDROMORPHONE HCL PF 2 MG/ML IJ SOLN
2.0000 mg | Freq: Once | INTRAMUSCULAR | Status: AC
  Administered 2011-11-21: 2 mg via INTRAVENOUS
  Filled 2011-11-21: qty 1

## 2011-11-21 MED ORDER — FOLIC ACID 1 MG PO TABS
1.0000 mg | ORAL_TABLET | Freq: Every day | ORAL | Status: DC
  Administered 2011-11-21 – 2011-11-29 (×9): 1 mg via ORAL
  Filled 2011-11-21 (×9): qty 1

## 2011-11-21 MED ORDER — LIDOCAINE HCL 2 % IJ SOLN
INTRAMUSCULAR | Status: AC
  Filled 2011-11-21: qty 20

## 2011-11-21 MED ORDER — OLANZAPINE 10 MG PO TABS
10.0000 mg | ORAL_TABLET | Freq: Every day | ORAL | Status: DC
  Administered 2011-11-21 – 2011-11-28 (×8): 10 mg via ORAL
  Filled 2011-11-21 (×9): qty 1

## 2011-11-21 NOTE — ED Notes (Signed)
IV team at bedside 

## 2011-11-21 NOTE — ED Provider Notes (Signed)
Patient signed out to me by Dr. Rennis Chris, and his pain is now controlled at this time. He will be admitted to the hospitalist  Toy Baker, MD 11/21/11 1658

## 2011-11-21 NOTE — ED Notes (Signed)
Patient went to the bathroom and forgot to give urine sample.

## 2011-11-21 NOTE — ED Notes (Signed)
Gave patient a urinal to give urine sample

## 2011-11-21 NOTE — H&P (Signed)
Patient ID: [name], male   DOB: 06/03/1980, 30 y.o.   MRN: [id number]  Chief Complaint  Patient presents with  . Sickle Cell Pain Crisis    HPI [name] is a 31 y.o. male with hemoglobin SS disease who represents after being discharged. He notably was not able to manage his pain at home. He subsequently admitted for further treatment.He denies any new complaints at this time. Rates his pain as a 7/10. No other new complaints.   Past Medical History  Diagnosis Date  . Sickle cell anemia   . Blood transfusion   . 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 Hypokalemia   . Mood disorder   . Pulmonary embolism   . Avascular necrosis   . Leukocytosis     Chronic  . Thrombocytosis     Chronic    Past Surgical History  Procedure Date  . Right hip replacement     08/2006  . Cholecystectomy     01/2008  . Porta cath placement   . Porta cath removal   . Umbilical hernia repair     01/2008  . Excision of left periauricular cyst     10/2009  . Excision of right ear lobe cyst with primary closur     11/2007    Family History  Problem Relation Age of Onset  . Sickle cell anemia Mother   . Sickle cell anemia Father   . Sickle cell trait Brother     Social History History  Substance Use Topics  . Smoking status: Former Smoker -- 13 years    Types: Cigarettes    Quit date: 07/08/2010  . Smokeless tobacco: Never Used  . Alcohol Use: No    Allergies  Allergen Reactions  . Morphine And Related Hives and Rash    Pt states he is able to tolerate Dilaudid with no reactions.   MEDICATIONS: Medications reviewed. No current facility-administered medications for this encounter.   Current Outpatient Prescriptions  Medication Sig Dispense Refill  . folic acid (FOLVITE) 1 MG tablet Take 1 mg by mouth daily.      . hydroxyurea (HYDREA) 500 MG capsule Take 500 mg by mouth 2 (two) times daily. May take with food to minimize GI side effects.       Marland Kitchen ibuprofen (ADVIL,MOTRIN) 800 MG tablet Take 800 mg by mouth every 8 (eight) hours as needed. For pain.      . potassium chloride SA (K-DUR,KLOR-CON) 20 MEQ tablet Take 1 tablet (20 mEq total) by mouth 2 (two) times daily.  60 tablet  1  . HYDROmorphone (DILAUDID) 4 MG tablet Take 4 mg by mouth every 4 (four) hours as needed. For pain.      Marland Kitchen OLANZapine (ZYPREXA) 10 MG tablet Take 10 mg by mouth at bedtime.      Marland Kitchen oxyCODONE (OXYCONTIN) 80 MG 12 hr tablet Take 80 mg by mouth every 12 (twelve) hours.      . temazepam (RESTORIL) 7.5 MG capsule Take 7.5 mg by mouth at bedtime as needed. Insomnia      . DISCONTD: temazepam (RESTORIL) 7.5 MG capsule Take 1 capsule (7.5 mg total) by mouth at bedtime as needed for sleep.  30 capsule  0   Facility-Administered Medications Ordered in Other Encounters  Medication Dose Route Frequency Provider Last Rate Last Dose  . HYDROmorphone (DILAUDID) injection 2 mg  2 mg Intramuscular Once Doug Sou, MD   2 mg at 11/21/11  1320  . DISCONTD: HYDROmorphone (DILAUDID) injection 1 mg  1 mg Intravenous Once Doug Sou, MD      . DISCONTD: HYDROmorphone (DILAUDID) injection 2 mg  2 mg Intravenous Once Doug Sou, MD      . DISCONTD: lidocaine (XYLOCAINE) 2 % (with pres) injection             Review of Systems As noted above. Blood pressure 118/76, pulse 90, temperature 98.3 F (36.8 C), temperature source Oral, resp. rate 18, height 6' (1.829 m), weight 177 lb 0.5 oz (80.3 kg), SpO2 100.00%.  Physical Exam Well-developed well-nourished black male in mild distress. HEENT: No sinus tenderness. Mild sclericterus. TMs are clear. NECK: No enlarged thyroid. No posterior cervical nodes. LUNGS: Clear to auscultation. Tenderness in the left lower base. Anterior chest wall tenderness. CV: Normal S1, S2 without S3. ABDOMEN: No epigastric tenderness. MSK: Negative Homans no edema. NEURO: Intact.  Data Reviewed  No results found for this or any previous visit  (from the past 48 hour(s)). Results reviewed.  Assessment    Sickle cell crisis. Slowly resolving. Muscle skeletal chest pain. Sickle cell lung disease a history of acute chest syndrome. Mood disorder. History of hypokalemia.    Plan    IV fluids with hypo-tonic saline. We'll supplement his potassium. IV Dilaudid q.2 hours p.r.n. Severe pain. Incentive spirometry. Reevaluate in 23 hours.       [name] 11/21/2011, 3:24 PM

## 2011-11-21 NOTE — Procedures (Signed)
Procedure:  Right arm PICC line placement Access:  Right brachial vein 5 Fr SL Power PICC, 42 cm.  Tip at cavoatrial junction.

## 2011-11-21 NOTE — ED Notes (Signed)
Pt transported to IR 

## 2011-11-21 NOTE — ED Notes (Signed)
Patient in IR for picc placement

## 2011-11-21 NOTE — ED Notes (Signed)
Pt returned from IR. CT completed

## 2011-11-21 NOTE — ED Provider Notes (Signed)
History     CSN: [id number]  Arrival date & time 11/21/11  1038   First MD Initiated Contact with Patient 11/21/11 1108      Chief Complaint  Patient presents with  . Shortness of Breath  . Flank Pain    (Consider location/radiation/quality/duration/timing/severity/associated sxs/prior treatment) HPI Complains of right-sided flank pain and thoracic back pain, right side greater than left onset last night accompanied by shortness of breath no cough no fever. You have toxic content and without adequate pain relief. Symptoms feel like sickle crisis although he has not had associated shortness of breath before with his sickle cell crises. This is worse with exertion and improved with rest. No other associated symptoms Past Medical History  Diagnosis Date  . Sickle cell anemia   . Blood transfusion   . 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 Hypokalemia   . Mood disorder   . Pulmonary embolism   . Avascular necrosis   . Leukocytosis     Chronic  . Thrombocytosis     Chronic    Past Surgical History  Procedure Date  . Right hip replacement     08/2006  . Cholecystectomy     01/2008  . Porta cath placement   . Porta cath removal   . Umbilical hernia repair     01/2008  . Excision of left periauricular cyst     10/2009  . Excision of right ear lobe cyst with primary closur     11/2007    Family History  Problem Relation Age of Onset  . Sickle cell anemia Mother   . Sickle cell anemia Father   . Sickle cell trait Brother     History  Substance Use Topics  . Smoking status: Former Smoker -- 13 years    Types: Cigarettes    Quit date: 07/08/2010  . Smokeless tobacco: Never Used  . Alcohol Use: No      Review of Systems  Constitutional: Negative.   HENT: Negative.   Respiratory: Positive for shortness of breath.   Cardiovascular: Negative.   Gastrointestinal: Negative.   Genitourinary: Positive for flank pain.  Musculoskeletal:  Positive for back pain.  Skin: Negative.   Neurological: Negative.   Hematological: Negative.   Psychiatric/Behavioral: Negative.   All other systems reviewed and are negative.    Allergies  Morphine and related  Home Medications   Current Outpatient Rx  Name Route Sig Dispense Refill  . FOLIC ACID 1 MG PO TABS Oral Take 1 mg by mouth daily.    Marland Kitchen HYDROMORPHONE HCL 4 MG PO TABS Oral Take 4 mg by mouth every 4 (four) hours as needed. For pain.    Marland Kitchen HYDROXYUREA 500 MG PO CAPS Oral Take 500 mg by mouth 2 (two) times daily. May take with food to minimize GI side effects.    . IBUPROFEN 800 MG PO TABS Oral Take 800 mg by mouth every 8 (eight) hours as needed. For pain.    Marland Kitchen OLANZAPINE 10 MG PO TABS Oral Take 10 mg by mouth at bedtime.    . OXYCODONE HCL ER 80 MG PO TB12 Oral Take 80 mg by mouth every 12 (twelve) hours.    Marland Kitchen POTASSIUM CHLORIDE CRYS ER 20 MEQ PO TBCR Oral Take 1 tablet (20 mEq total) by mouth 2 (two) times daily. 60 tablet 1  . TEMAZEPAM 7.5 MG PO CAPS Oral Take 7.5 mg by mouth at bedtime as needed. Insomnia  BP 120/71  Pulse 111  Temp 99.4 F (37.4 C) (Oral)  Resp 26  SpO2 95%  Physical Exam  Nursing note and vitals reviewed. Constitutional: He appears well-developed and well-nourished.  HENT:  Head: Normocephalic and atraumatic.  Eyes: Pupils are equal, round, and reactive to light. Scleral icterus is present.  Neck: Neck supple. No tracheal deviation present. No thyromegaly present.  Cardiovascular: Regular rhythm.   No murmur heard. Pulmonary/Chest: Effort normal and breath sounds normal.  Abdominal: Soft. Bowel sounds are normal. He exhibits no distension. There is no tenderness.  Musculoskeletal: Normal range of motion. He exhibits no edema and no tenderness.  Neurological: He is alert. Coordination normal.  Skin: Skin is warm and dry. No rash noted.  Psychiatric: He has a normal mood and affect.    ED Course  Procedures (including critical care  time)  Labs Reviewed - No data to display No results found.   No diagnosis found. Patient received a PICC line via interventional radiology as he had no peripheral IV access. At 4:15 PM he requested more pain medicine. Hydromorphone IV ordered  Results for orders placed during the hospital encounter of 11/21/11  CBC WITH DIFFERENTIAL      Component Value Range   WBC 25.6 (*) 4.0 - 10.5 K/uL   RBC 2.95 (*) 4.22 - 5.81 MIL/uL   Hemoglobin 8.9 (*) 13.0 - 17.0 g/dL   HCT 16.1 (*) 09.6 - 04.5 %   MCV 87.8  78.0 - 100.0 fL   MCH 30.2  26.0 - 34.0 pg   MCHC 34.4  30.0 - 36.0 g/dL   RDW 40.9 (*) 81.1 - 91.4 %   Platelets 399  150 - 400 K/uL   Neutrophils Relative 82 (*) 43 - 77 %   Lymphocytes Relative 4 (*) 12 - 46 %   Monocytes Relative 13 (*) 3 - 12 %   Eosinophils Relative 0  0 - 5 %   Basophils Relative 1  0 - 1 %   Neutro Abs 21.0 (*) 1.7 - 7.7 K/uL   Lymphs Abs 1.0  0.7 - 4.0 K/uL   Monocytes Absolute 3.3 (*) 0.1 - 1.0 K/uL   Eosinophils Absolute 0.0  0.0 - 0.7 K/uL   Basophils Absolute 0.3 (*) 0.0 - 0.1 K/uL   RBC Morphology MARKED POLYCHROMASIA     Smear Review PLATELET COUNT CONFIRMED BY SMEAR    COMPREHENSIVE METABOLIC PANEL      Component Value Range   Sodium 140  135 - 145 mEq/L   Potassium 2.9 (*) 3.5 - 5.1 mEq/L   Chloride 104  96 - 112 mEq/L   CO2 20  19 - 32 mEq/L   Glucose, Bld 115 (*) 70 - 99 mg/dL   BUN 5 (*) 6 - 23 mg/dL   Creatinine, Ser 7.82  0.50 - 1.35 mg/dL   Calcium 9.3  8.4 - 95.6 mg/dL   Total Protein 8.6 (*) 6.0 - 8.3 g/dL   Albumin 4.2  3.5 - 5.2 g/dL   AST 40 (*) 0 - 37 U/L   ALT 31  0 - 53 U/L   Alkaline Phosphatase 108  39 - 117 U/L   Total Bilirubin 4.7 (*) 0.3 - 1.2 mg/dL   GFR calc non Af Amer >90  >90 mL/min   GFR calc Af Amer >90  >90 mL/min   Dg Chest 2 View  11/21/2011  *RADIOLOGY REPORT*  Clinical Data: Shortness of breath.  History of sickle cell disease.  CHEST - 2 VIEW  Comparison: 11/03/2011 and 10/02/2011.  Findings: There  is stable mild cardiac enlargement.  Mediastinal contours are stable.  There is stable chronic central airway thickening without evidence of superimposed airspace disease, edema or pleural effusion.  The osseous structures appear unchanged. Telemetry leads overlie the chest.  IMPRESSION: Stable mild cardiomegaly and chronic lung disease.  No acute cardiopulmonary process.   Original Report Authenticated By: Gerrianne Scale, M.D.    Dg Chest 2 View  11/03/2011  *RADIOLOGY REPORT*  Clinical Data: 31 year old male rib pain and sickle cell disease.  CHEST - 2 VIEW  Comparison: 10/02/2011 and earlier.  Findings: Decreased cardiac size. Other mediastinal contours are within normal limits.  Visualized tracheal air column is within normal limits.  Decreased pulmonary interstitial markings/vascularity.  No pleural effusion.  No confluent or acute pulmonary opacity.  No pneumothorax or edema. Stable visualized osseous structures.  Right upper quadrant surgical clips.  IMPRESSION: No acute cardiopulmonary abnormality.   Original Report Authenticated By: Harley Hallmark, M.D.    Ct Angio Chest Pe W/cm &/or Wo Cm  11/21/2011  *RADIOLOGY REPORT*  Clinical Data: Pleuritic chest pain.  Shortness of breath.  History of sickle cell.  CT ANGIOGRAPHY CHEST  Technique:  Multidetector CT imaging of the chest using the standard protocol during bolus administration of intravenous contrast. Multiplanar reconstructed images including MIPs were obtained and reviewed to evaluate the vascular anatomy.  Contrast: 80mL OMNIPAQUE IOHEXOL 350 MG/ML SOLN  Comparison: Chest CTA 04/25/2011.  Findings: The pulmonary arteries are adequately opacified with contrast.  There is no evidence of acute pulmonary embolism.  Mild central enlargement of the pulmonary arteries is stable.  There are stable mildly prominent hilar and prevascular lymph nodes bilaterally.  There is no pleural or pericardial effusion.  Right arm PICC remains in place.   Scattered patchy opacities in both lungs are stable and most consistent with atelectasis or scarring.  There is no endobronchial lesion or confluent airspace opacity.  The visualized upper abdomen has a stable appearance with autoinfarction of the spleen.  Chronic endplate deformities throughout the thoracic spine are stable.  IMPRESSION:  1.  No evidence of acute pulmonary embolism. 2.  Stable scattered pulmonary parenchymal scarring or atelectasis. 3.  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   Original Report Authenticated By: Gerrianne Scale, M.D.    Ir Fluoro Guide Cv Line Right  11/05/2011  *RADIOLOGY REPORT*  Clinical Data: Sickle cell crisis and need for central venous access.  PICC LINE PLACEMENT WITH ULTRASOUND AND FLUOROSCOPIC  GUIDANCE  Fluoroscopy Time: 0.1 minutes.  The right arm was prepped with chlorhexidine, draped in the usual sterile fashion using maximum barrier technique (cap and mask, sterile gown, sterile gloves, large sterile sheet, hand hygiene and cutaneous antisepsis) and infiltrated locally with 1% Lidocaine.  Ultrasound demonstrated patency of the right basilic vein, and this was documented with an image.  Under real-time ultrasound guidance, this vein was accessed with a 21 gauge micropuncture needle and image documentation was performed.  The needle was exchanged over a guidewire for a peel-away sheath through which a 5 [place] dual lumen PICC trimmed to 36 cm was advanced, positioned with its tip at the lower SVC/right atrial junction.  Fluoroscopy during the procedure and fluoro spot radiograph confirms appropriate catheter position.  The catheter was flushed, secured to the skin with Prolene sutures, and covered with a sterile dressing.  Complications:  None  IMPRESSION: Successful right arm PICC line placement with ultrasound and fluoroscopic guidance.  The catheter is ready for use.    Original Report Authenticated By: Reola Calkins, M.D.    Ir US Guide Vasc Access Right  11/05/2011  *RADIOLOGY REPORT*  Clinical Data: Sickle cell crisis and need for central venous access.  PICC LINE PLACEMENT WITH ULTRASOUND AND FLUOROSCOPIC  GUIDANCE  Fluoroscopy Time: 0.1 minutes.  The right arm was prepped with chlorhexidine, draped in the usual sterile fashion using maximum barrier technique (cap and mask, sterile gown, sterile gloves, large sterile sheet, hand hygiene and cutaneous antisepsis) and infiltrated locally with 1% Lidocaine.  Ultrasound demonstrated patency of the right basilic vein, and this was documented with an image.  Under real-time ultrasound guidance, this vein was accessed with a 21 gauge micropuncture needle and image documentation was performed.  The needle was exchanged over a guidewire for a peel-away sheath through which a 5 [place] dual lumen PICC trimmed to 36 cm was advanced, positioned with its tip at the lower SVC/right atrial junction.  Fluoroscopy during the procedure and fluoro spot radiograph confirms appropriate catheter position.  The catheter was flushed, secured to the skin with Prolene sutures, and covered with a sterile dressing.  Complications:  None  IMPRESSION: Successful right arm PICC line placement with ultrasound and fluoroscopic guidance.  The catheter is ready for use.   Original Report Authenticated By: Reola Calkins, M.D.     MDM  pt signed out to Dr. Freida Busman 4 20 p.m. who will reevaluate the patient and make disposition Diagnoses #1 sickle cell crisis #2 dyspnea #3 hypokalemia       Doug Sou, MD 11/21/11 1630

## 2011-11-21 NOTE — ED Notes (Signed)
Pt c/o SOB with activity, lower right back pain and flank pain that started this am when he woke up. Pt has hx of sickle cell and feels this may be related. Reports SOB unusual with sickle cell crisis.

## 2011-11-21 NOTE — ED Notes (Signed)
Attempted IV access x 4, IV team called, per Dr. Shela Commons wants another RN in ED to try

## 2011-11-21 NOTE — ED Notes (Signed)
Patient returned to ED

## 2011-11-21 NOTE — ED Notes (Signed)
Called floor to give report. RN in another pt's room at this time. Waiting call back for report.

## 2011-11-21 NOTE — H&P (Signed)
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 Pt reports similar episodes in the past and resembling his typical SS crisis. He denies any specific aggravating or alleviating factors, no relief of symptoms with pain medications. He denies any cough or sputum production, no headaches or visual changes.   Assessment and Plan: Sickle Cell Crisis - pt's symptoms consistent with SSC - will alert Dr. [name] of pt's arrival - I will admit the pt to telemetry for now for further evaluation and management - I will continue IVF NS, analgesia as needed, antiemetics, oxygen as needed - will obtain UA, urine culture, blood cultures - given rather significant leukocytosis will cover broadly for tonight and regimen can be changed and narrowed in the morning if no infectious etiology found - transfuse 1 unit PRBC  Leukocytosis - unclear etiology but pt also has low grade fever - CXR is not suggestive of pneumonia but no UA obtained in ED - I will go ahead and order UA, urine cultures, blood cultures - will cover broadly tonight and ABX can be discontinued if no infectious etiology found  Hypokalemia - will supplement and will repeat BMP tonight - BMP in AM  Code Status: Full Family Communication: Pt at bedside Disposition Plan: Admit to telemetry  Review of Systems:  Constitutional: Positive for fever, chills and malaise/fatigue. Negative for diaphoresis.  HENT: Negative for hearing  loss, ear pain, nosebleeds, congestion, sore throat, neck pain, tinnitus and ear discharge.   Eyes: Negative for blurred vision, double vision, photophobia, pain, discharge and redness.  Respiratory: Negative for cough, hemoptysis, sputum production, positive for shortness of breath   Cardiovascular: Negative for chest pain, palpitations, orthopnea, claudication and leg swelling.  Gastrointestinal: Positive for nausea,  and abdominal pain. Negative for heartburn, constipation, blood in stool and melena.  Genitourinary: Negative for dysuria, urgency, frequency, hematuria and flank pain.  Musculoskeletal: Negative for myalgias, positive back pain, negative for joint pain and falls.  Skin: Negative for itching and rash.  Neurological: Negative for dizziness and weakness. Negative for tingling, tremors, sensory change, speech change, focal weakness, loss of consciousness and headaches.  Endo/Heme/Allergies: Negative for environmental allergies and polydipsia. Does not bruise/bleed easily.  Psychiatric/Behavioral: Negative for suicidal ideas. The patient is not nervous/anxious.      Past Medical History  Diagnosis Date  . Sickle cell anemia   . Blood transfusion   . 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 Hypokalemia   . Mood disorder   . Pulmonary embolism   . Avascular necrosis   . Leukocytosis     Chronic  . Thrombocytosis     Chronic    Past Surgical History  Procedure Date  . Right hip replacement     08/2006  . Cholecystectomy     01/2008  . Porta cath placement   . Porta cath removal   . Umbilical hernia repair     01/2008  . Excision of left periauricular cyst     10/2009  . Excision of right  ear lobe cyst with primary closur     11/2007    Social History:  reports that he quit smoking about 16 months ago. His smoking use included Cigarettes. He quit after 13 years of use. He has never used smokeless tobacco. He reports that he does not drink  alcohol or use illicit drugs.  Allergies  Allergen Reactions  . Morphine And Related Hives and Rash    Pt states he is able to tolerate Dilaudid with no reactions.    Family History  Problem Relation Age of Onset  . Sickle cell anemia Mother   . Sickle cell anemia Father   . Sickle cell trait Brother     Prior to Admission medications   Medication Sig Start Date End Date Taking? Authorizing Provider  folic acid (FOLVITE) 1 MG tablet Take 1 mg by mouth daily.   Yes Historical Provider, MD  HYDROmorphone (DILAUDID) 4 MG tablet Take 4 mg by mouth every 4 (four) hours as needed. For pain. 11/08/11  Yes Gwenyth Bender, MD  hydroxyurea (HYDREA) 500 MG capsule Take 500 mg by mouth 2 (two) times daily. May take with food to minimize GI side effects.   Yes Keitha Butte, NP  ibuprofen (ADVIL,MOTRIN) 800 MG tablet Take 800 mg by mouth every 8 (eight) hours as needed. For pain.   Yes Historical Provider, MD  OLANZapine (ZYPREXA) 10 MG tablet Take 10 mg by mouth at bedtime. 11/08/11  Yes Gwenyth Bender, MD  oxyCODONE (OXYCONTIN) 80 MG 12 hr tablet Take 80 mg by mouth every 12 (twelve) hours. 11/08/11  Yes Gwenyth Bender, MD  potassium chloride SA (K-DUR,KLOR-CON) 20 MEQ tablet Take 1 tablet (20 mEq total) by mouth 2 (two) times daily. 09/14/11  Yes Gwenyth Bender, MD  temazepam (RESTORIL) 7.5 MG capsule Take 7.5 mg by mouth at bedtime as needed. Insomnia 11/08/11 12/08/11 Yes Gwenyth Bender, MD    Physical Exam: Filed Vitals:   11/21/11 1105 11/21/11 1107 11/21/11 1124 11/21/11 1628  BP: 120/71   128/82  Pulse: 111   100  Temp: 99.4 F (37.4 C)     TempSrc: Oral     Resp: 26   17  SpO2: 88% 98% 95% 100%    Physical Exam  Constitutional: Appears well-developed and well-nourished. In mild distress due to pain  HENT: Normocephalic. External right and left ear normal. Dry MM. Eyes: Conjunctivae and EOM are normal. PERRLA, no scleral icterus.  Neck: Normal ROM. Neck supple. No JVD. No tracheal deviation.  No thyromegaly.  CVS: Regular rhythm, tachycardic, S1/S2 +, no murmurs, no gallops, no carotid bruit.  Pulmonary: Effort and breath sounds normal but decreased sounds at bases, no stridor, rhonchi, wheezes, rales.  Abdominal: Soft. BS +,  no distension, tenderness in epigastric area, rebound or guarding. Bilateral flank area tenderness. Musculoskeletal: Normal range of motion. No edema and no tenderness.  Lymphadenopathy: No lymphadenopathy noted, cervical, inguinal. Neuro: Alert. Normal reflexes, muscle tone coordination. No cranial nerve deficit. Skin: Skin is warm and dry. No rash noted. Not diaphoretic. No erythema. No pallor.  Psychiatric: Normal mood and affect. Behavior, judgment, thought content normal.   Labs on Admission:  Basic Metabolic Panel:  Lab 11/21/11 0981  NA 140  K 2.9*  CL 104  CO2 20  GLUCOSE 115*  BUN 5*  CREATININE 0.61  CALCIUM 9.3  MG --  PHOS --   Liver Function Tests:  Lab 11/21/11 1214  AST 40*  ALT 31  ALKPHOS 108  BILITOT 4.7*  PROT 8.6*  ALBUMIN 4.2   No results found for this basename: LIPASE:5,AMYLASE:5 in the last 168 hours No results found for this basename: AMMONIA:5 in the last 168 hours CBC:  Lab 11/21/11 1214  WBC 25.6*  NEUTROABS 21.0*  HGB 8.9*  HCT 25.9*  MCV 87.8  PLT 399   Radiological Exams on Admission: Dg Chest 2 View  11/21/2011  *RADIOLOGY REPORT*  Clinical Data: Shortness of breath.  History of sickle cell disease.  CHEST - 2 VIEW  Comparison: 11/03/2011 and 10/02/2011.  Findings: There is stable mild cardiac enlargement.  Mediastinal contours are stable.  There is stable chronic central airway thickening without evidence of superimposed airspace disease, edema or pleural effusion.  The osseous structures appear unchanged. Telemetry leads overlie the chest.  IMPRESSION: Stable mild cardiomegaly and chronic lung disease.  No acute cardiopulmonary process.   Original Report Authenticated By: Gerrianne Scale, M.D.     Ct Angio Chest Pe W/cm &/or Wo Cm  11/21/2011  *RADIOLOGY REPORT*  Clinical Data: Pleuritic chest pain.  Shortness of breath.  History of sickle cell.  CT ANGIOGRAPHY CHEST  Technique:  Multidetector CT imaging of the chest using the standard protocol during bolus administration of intravenous contrast. Multiplanar reconstructed images including MIPs were obtained and reviewed to evaluate the vascular anatomy.  Contrast: 80mL OMNIPAQUE IOHEXOL 350 MG/ML SOLN  Comparison: Chest CTA 04/25/2011.  Findings: The pulmonary arteries are adequately opacified with contrast.  There is no evidence of acute pulmonary embolism.  Mild central enlargement of the pulmonary arteries is stable.  There are stable mildly prominent hilar and prevascular lymph nodes bilaterally.  There is no pleural or pericardial effusion.  Right arm PICC remains in place.  Scattered patchy opacities in both lungs are stable and most consistent with atelectasis or scarring.  There is no endobronchial lesion or confluent airspace opacity.  The visualized upper abdomen has a stable appearance with autoinfarction of the spleen.  Chronic endplate deformities throughout the thoracic spine are stable.  IMPRESSION:  1.  No evidence of acute pulmonary embolism. 2.  Stable scattered pulmonary parenchymal scarring or atelectasis. 3.  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   Original Report Authenticated By: Gerrianne Scale, M.D.    Ir Fluoro Guide Cv Line Right  11/21/2011  *RADIOLOGY REPORT*  Clinical Data: Chest pain, sickle cell anemia and lack of IV access.  PICC LINE PLACEMENT WITH ULTRASOUND AND FLUOROSCOPIC GUIDANCE  Fluoroscopy Time: 0.2 minutes.  The right arm was prepped with chlorhexidine, draped in the usual sterile fashion using maximum barrier technique (cap and mask, sterile gown, sterile gloves, large sterile sheet, hand hygiene and cutaneous  antisepsis) and infiltrated locally with 1% Lidocaine.  Ultrasound demonstrated patency of the right brachial vein, and this was documented with an image.  Under real-time ultrasound guidance, this vein was accessed with a 21 gauge micropuncture needle and image documentation was performed.  The needle was exchanged over a guidewire for a peel-away sheath through which a 5 [place] single lumen PICC trimmed to 42 cm was advanced, positioned with its tip at the lower SVC/right atrial junction.  Fluoroscopy during the procedure and fluoro spot radiograph confirms appropriate catheter position.  The catheter was flushed, secured to the skin with Prolene sutures, and covered with a sterile dressing.  Complications:  None  IMPRESSION: Successful right arm PICC line placement with ultrasound and  fluoroscopic guidance.  The catheter is ready for use. A power injectable PICC line was placed.   Original Report Authenticated By: Reola Calkins, M.D.    Ir US Guide Vasc Access Right  11/21/2011  *RADIOLOGY REPORT*  Clinical Data: Chest pain, sickle cell anemia and lack of IV access.  PICC LINE PLACEMENT WITH ULTRASOUND AND FLUOROSCOPIC GUIDANCE  Fluoroscopy Time: 0.2 minutes.  The right arm was prepped with chlorhexidine, draped in the usual sterile fashion using maximum barrier technique (cap and mask, sterile gown, sterile gloves, large sterile sheet, hand hygiene and cutaneous antisepsis) and infiltrated locally with 1% Lidocaine.  Ultrasound demonstrated patency of the right brachial vein, and this was documented with an image.  Under real-time ultrasound guidance, this vein was accessed with a 21 gauge micropuncture needle and image documentation was performed.  The needle was exchanged over a guidewire for a peel-away sheath through which a 5 [place] single lumen PICC trimmed to 42 cm was advanced, positioned with its tip at the lower SVC/right atrial junction.  Fluoroscopy during the procedure and fluoro spot  radiograph confirms appropriate catheter position.  The catheter was flushed, secured to the skin with Prolene sutures, and covered with a sterile dressing.  Complications:  None  IMPRESSION: Successful right arm PICC line placement with ultrasound and fluoroscopic guidance.  The catheter is ready for use. A power injectable PICC line was placed.   Original Report Authenticated By: Reola Calkins, M.D.     EKG: Normal sinus rhythm, no ST/T wave changes  Debbora Presto, MD  [facility] Pager [phone]  If 7PM-7AM, please contact night-coverage [email or web address] Password [email or web address] 11/21/2011, 5:35 PM

## 2011-11-22 DIAGNOSIS — I10 Essential (primary) hypertension: Secondary | ICD-10-CM | POA: Diagnosis not present

## 2011-11-22 DIAGNOSIS — F39 Unspecified mood [affective] disorder: Secondary | ICD-10-CM | POA: Diagnosis not present

## 2011-11-22 DIAGNOSIS — D5700 Hb-SS disease with crisis, unspecified: Secondary | ICD-10-CM | POA: Diagnosis not present

## 2011-11-22 DIAGNOSIS — E876 Hypokalemia: Secondary | ICD-10-CM | POA: Diagnosis not present

## 2011-11-22 DIAGNOSIS — M545 Low back pain: Secondary | ICD-10-CM | POA: Diagnosis not present

## 2011-11-22 DIAGNOSIS — G4700 Insomnia, unspecified: Secondary | ICD-10-CM | POA: Diagnosis not present

## 2011-11-22 DIAGNOSIS — R0789 Other chest pain: Secondary | ICD-10-CM | POA: Diagnosis not present

## 2011-11-22 LAB — URINE CULTURE

## 2011-11-22 LAB — BASIC METABOLIC PANEL
CO2: 21 mEq/L (ref 19–32)
Calcium: 8.3 mg/dL — ABNORMAL LOW (ref 8.4–10.5)
Glucose, Bld: 98 mg/dL (ref 70–99)
Sodium: 139 mEq/L (ref 135–145)

## 2011-11-22 LAB — COMPREHENSIVE METABOLIC PANEL
ALT: 20 U/L (ref 0–53)
Alkaline Phosphatase: 89 U/L (ref 39–117)
BUN: 3 mg/dL — ABNORMAL LOW (ref 6–23)
CO2: 22 mEq/L (ref 19–32)
Calcium: 8.1 mg/dL — ABNORMAL LOW (ref 8.4–10.5)
GFR calc Af Amer: >90 mL/min (ref >90)
GFR calc non Af Amer: >90 mL/min (ref >90)
Glucose, Bld: 98 mg/dL (ref 70–99)
Potassium: 3 mEq/L — ABNORMAL LOW (ref 3.5–5.1)
Sodium: 136 mEq/L (ref 135–145)

## 2011-11-22 LAB — CBC
HCT: 23.2 % — ABNORMAL LOW (ref 39.0–52.0)
Hemoglobin: 7.8 g/dL — ABNORMAL LOW (ref 13.0–17.0)
MCH: 29.8 pg (ref 26.0–34.0)
RBC: 2.62 MIL/uL — ABNORMAL LOW (ref 4.22–5.81)

## 2011-11-22 MED ORDER — HYDROMORPHONE HCL PF 2 MG/ML IJ SOLN
2.0000 mg | INTRAMUSCULAR | Status: DC | PRN
  Administered 2011-11-25: 4 mg via INTRAVENOUS
  Filled 2011-11-22 (×31): qty 2

## 2011-11-22 MED ORDER — DOCUSATE SODIUM 100 MG PO CAPS
100.0000 mg | ORAL_CAPSULE | Freq: Two times a day (BID) | ORAL | Status: DC
  Administered 2011-11-22 – 2011-11-29 (×14): 100 mg via ORAL
  Filled 2011-11-22 (×15): qty 1

## 2011-11-22 MED ORDER — POTASSIUM CHLORIDE CRYS ER 20 MEQ PO TBCR
20.0000 meq | EXTENDED_RELEASE_TABLET | Freq: Two times a day (BID) | ORAL | Status: DC
  Administered 2011-11-22 – 2011-11-29 (×14): 20 meq via ORAL
  Filled 2011-11-22 (×15): qty 1

## 2011-11-22 MED ORDER — DEXTROSE 5 % IV SOLN
2000.0000 mg | Freq: Every day | INTRAVENOUS | Status: DC
  Administered 2011-11-22 – 2011-11-28 (×7): 2000 mg via INTRAVENOUS
  Filled 2011-11-22 (×9): qty 2

## 2011-11-22 MED ORDER — HYDROMORPHONE HCL PF 2 MG/ML IJ SOLN
4.0000 mg | INTRAMUSCULAR | Status: DC
  Administered 2011-11-22 – 2011-11-29 (×79): 4 mg via INTRAVENOUS
  Filled 2011-11-22 (×49): qty 2

## 2011-11-22 MED ORDER — LACTULOSE 10 GM/15ML PO SOLN
20.0000 g | Freq: Every day | ORAL | Status: DC | PRN
  Filled 2011-11-22: qty 30

## 2011-11-22 MED ORDER — PROMETHAZINE HCL 25 MG/ML IJ SOLN
12.5000 mg | INTRAMUSCULAR | Status: DC | PRN
  Administered 2011-11-22: 12.5 mg via INTRAVENOUS
  Administered 2011-11-22 – 2011-11-29 (×39): 25 mg via INTRAVENOUS
  Filled 2011-11-22 (×41): qty 1

## 2011-11-22 MED ORDER — ZOLPIDEM TARTRATE 10 MG PO TABS
10.0000 mg | ORAL_TABLET | Freq: Every evening | ORAL | Status: DC | PRN
  Administered 2011-11-22 – 2011-11-23 (×2): 10 mg via ORAL
  Filled 2011-11-22 (×2): qty 1

## 2011-11-22 MED ORDER — DEXTROSE-NACL 5-0.45 % IV SOLN
INTRAVENOUS | Status: DC
  Administered 2011-11-22: 100 mL/h via INTRAVENOUS
  Administered 2011-11-23: 100 mL via INTRAVENOUS
  Administered 2011-11-23 – 2011-11-24 (×2): via INTRAVENOUS
  Administered 2011-11-24: 100 mL/h via INTRAVENOUS
  Administered 2011-11-25: 02:00:00 via INTRAVENOUS
  Administered 2011-11-25: 100 mL via INTRAVENOUS
  Administered 2011-11-25: 100 mL/h via INTRAVENOUS
  Administered 2011-11-26 – 2011-11-27 (×4): via INTRAVENOUS
  Administered 2011-11-28: 100 mL/h via INTRAVENOUS
  Administered 2011-11-29: 03:00:00 via INTRAVENOUS

## 2011-11-22 NOTE — Progress Notes (Signed)
Brief HPI: [name] is a 31 yo AA male with SS genotype Sickle Cell Disease who presented to the ED with progressively worsening bilateral flank pain (right > Left) and low back back pain. This pain was associated shortness of breath and rated 8/10 and not relieved with his oral pain medications. He was also complaining of subjective fevers and chills since Saturday. He began having N/V Saturday night and began having dizziness Monday morning. He also was SOB with DOE while attempting to get ready to come to the hospital. reported Pt reports similar episodes in the past and resembling his typical SS crisis. He denies any specific aggravating or alleviating factors, no relief of symptoms with pain medications. He denies any cough or sputum production, no headaches or visual changes.   Subjective: Patient seen on rounds today. He continues to have bilateral flank and low back pain rated 6-7/10 today. He reports having difficulty getting his pain medications every 2hrs when asked, which aggravates and intensifies his pain. Hie is no longer SOB/DOE but complains of an occasional dry nonproductive cough. He currently denies fever, chills, nausea, vomiting, abdominal pain, headache, dizziness, chest pain, LUTS, priapism, or changes to his bowel  Or bladder functioning. Appetite good.     Allergies  Allergen Reactions  . Morphine And Related Hives and Rash    Pt states he is able to tolerate Dilaudid with no reactions.   Current Facility-Administered Medications  Medication Dose Route Frequency Provider Last Rate Last Dose  . 0.45 % sodium chloride infusion   Intravenous Continuous Gwenyth Bender, MD 100 mL/hr at 11/22/11 1007 100 mL/hr at 11/22/11 1007  . diphenhydrAMINE (BENADRYL) capsule 25-50 mg  25-50 mg Oral Q4H PRN Dorothea Ogle, MD   25 mg at 11/22/11 0132   Or  . diphenhydrAMINE (BENADRYL) injection 12.5-25 mg  12.5-25 mg Intravenous Q4H PRN Dorothea Ogle, MD   25 mg at 11/22/11 1212  .  enoxaparin (LOVENOX) injection 30 mg  30 mg Subcutaneous Q12H Dorothea Ogle, MD   30 mg at 11/22/11 1006  . folic acid (FOLVITE) tablet 1 mg  1 mg Oral Daily Dorothea Ogle, MD   1 mg at 11/22/11 1007  . HYDROmorphone (DILAUDID) injection 2 mg  2 mg Intravenous Once Doug Sou, MD   2 mg at 11/21/11 1624  . HYDROmorphone (DILAUDID) injection 2 mg  2 mg Intravenous Once Toy Baker, MD   2 mg at 11/21/11 1723  . HYDROmorphone (DILAUDID) injection 2-4 mg  2-4 mg Intravenous Q2H PRN Gwenyth Bender, MD   2 mg at 11/22/11 1211  . hydroxyurea (HYDREA) capsule 500 mg  500 mg Oral BID Dorothea Ogle, MD   500 mg at 11/22/11 1007  . iohexol (OMNIPAQUE) 300 MG/ML solution 100 mL  100 mL Intravenous Once PRN Medication Radiologist, MD   100 mL at 11/21/11 1550  . iohexol (OMNIPAQUE) 350 MG/ML injection 80 mL  80 mL Intravenous Once PRN Medication Radiologist, MD   80 mL at 11/21/11 1602  . OLANZapine (ZYPREXA) tablet 10 mg  10 mg Oral QHS Dorothea Ogle, MD   10 mg at 11/21/11 2244  . oxyCODONE (OXYCONTIN) 12 hr tablet 80 mg  80 mg Oral Q12H Dorothea Ogle, MD   80 mg at 11/22/11 1007  . potassium chloride SA (K-DUR,KLOR-CON) CR tablet 40 mEq  40 mEq Oral Once Doug Sou, MD   40 mEq at 11/21/11 1624  . potassium chloride  SA (K-DUR,KLOR-CON) CR tablet 40 mEq  40 mEq Oral Once Dorothea Ogle, MD   40 mEq at 11/21/11 1759  . promethazine (PHENERGAN) injection 12.5-25 mg  12.5-25 mg Intravenous Q4H PRN Gwenyth Bender, MD   12.5 mg at 11/22/11 1212  . temazepam (RESTORIL) capsule 7.5 mg  7.5 mg Oral QHS PRN Dorothea Ogle, MD      . DISCONTD: 0.9 %  sodium chloride infusion   Intravenous Continuous Dorothea Ogle, MD 75 mL/hr at 11/21/11 1800    . DISCONTD: folic acid (FOLVITE) tablet 1 mg  1 mg Oral Daily Dorothea Ogle, MD      . DISCONTD: HYDROmorphone (DILAUDID) injection 2 mg  2 mg Intravenous Once Doug Sou, MD      . DISCONTD: HYDROmorphone (DILAUDID) injection 2 mg  2 mg Intravenous Q2H PRN Dorothea Ogle, MD   2 mg at 11/21/11 2006  . DISCONTD: promethazine (PHENERGAN) suppository 12.5-25 mg  12.5-25 mg Rectal Q4H PRN Dorothea Ogle, MD      . DISCONTD: promethazine (PHENERGAN) tablet 12.5-25 mg  12.5-25 mg Oral Q4H PRN Dorothea Ogle, MD   25 mg at 11/22/11 0132    Objective: Blood pressure 146/87, pulse 88, temperature 98.7 F (37.1 C), temperature source Oral, resp. rate 16, height 6' (1.829 m), weight 79.062 kg (174 lb 4.8 oz), SpO2 95.00%.  General Appearance: Alert and oriented, cooperative, well developed, well nourished, mild distress  Head: Normocephalic, without obvious abnormality, atraumatic  Eyes: PERRL, slight strabismus, mild icteric sclera  Nose: Nares normal, septum midline, mucosa normal, no drainage or sinus tenderness.  Throat: Normal lips, mucosa, tongue and gums  Back: Symmetric, mild CVA/flank tenderness, lower back tenderness  Resp: even and nonlabored, diminished bilateral bases, no vocal fremitus; no wheezes/rales/rhonchi  Cardio: S1, S2 regular, no murmurs/clicks/rubs/gallops  GI: Soft, ND, active bowel sounds, no masses, no organomegaly appreciated  Male Genitalia: Deferred, the patient denies priapism  Extremities: Extremities normal, atraumatic, no cyanosis or edema, negative Homans sign, no sign of DVT, no tenderness  Pulses: 2+ and symmetric  Skin: Skin color, texture, turgor normal, no rashes or lesions, numerous tattoos  Neurologic: Grossly normal, CN II-XII intact, no focal deficits  Psych: Appropriate affect   Lab results: Results for orders placed during the hospital encounter of 11/21/11 (from the past 48 hour(s))  CBC WITH DIFFERENTIAL     Status: Abnormal   Collection Time   11/21/11 12:14 PM      Component Value Range Comment   WBC 25.6 (*) 4.0 - 10.5 K/uL    RBC 2.95 (*) 4.22 - 5.81 MIL/uL    Hemoglobin 8.9 (*) 13.0 - 17.0 g/dL    HCT 30.8 (*) 65.7 - 52.0 %    MCV 87.8  78.0 - 100.0 fL    MCH 30.2  26.0 - 34.0 pg    MCHC 34.4  30.0 -  36.0 g/dL    RDW 84.6 (*) 96.2 - 15.5 %    Platelets 399  150 - 400 K/uL    Neutrophils Relative 82 (*) 43 - 77 %    Lymphocytes Relative 4 (*) 12 - 46 %    Monocytes Relative 13 (*) 3 - 12 %    Eosinophils Relative 0  0 - 5 %    Basophils Relative 1  0 - 1 %    Neutro Abs 21.0 (*) 1.7 - 7.7 K/uL    Lymphs Abs 1.0  0.7 - 4.0  K/uL    Monocytes Absolute 3.3 (*) 0.1 - 1.0 K/uL    Eosinophils Absolute 0.0  0.0 - 0.7 K/uL    Basophils Absolute 0.3 (*) 0.0 - 0.1 K/uL    RBC Morphology MARKED POLYCHROMASIA      Smear Review PLATELET COUNT CONFIRMED BY SMEAR     COMPREHENSIVE METABOLIC PANEL     Status: Abnormal   Collection Time   11/21/11 12:14 PM      Component Value Range Comment   Sodium 140  135 - 145 mEq/L    Potassium 2.9 (*) 3.5 - 5.1 mEq/L    Chloride 104  96 - 112 mEq/L    CO2 20  19 - 32 mEq/L    Glucose, Bld 115 (*) 70 - 99 mg/dL    BUN 5 (*) 6 - 23 mg/dL    Creatinine, Ser 0.45  0.50 - 1.35 mg/dL    Calcium 9.3  8.4 - 40.9 mg/dL    Total Protein 8.6 (*) 6.0 - 8.3 g/dL    Albumin 4.2  3.5 - 5.2 g/dL    AST 40 (*) 0 - 37 U/L    ALT 31  0 - 53 U/L    Alkaline Phosphatase 108  39 - 117 U/L    Total Bilirubin 4.7 (*) 0.3 - 1.2 mg/dL    GFR calc non Af Amer >90  >90 mL/min    GFR calc Af Amer >90  >90 mL/min   MAGNESIUM     Status: Normal   Collection Time   11/21/11 12:14 PM      Component Value Range Comment   Magnesium 1.7  1.5 - 2.5 mg/dL   PHOSPHORUS     Status: Normal   Collection Time   11/21/11 12:14 PM      Component Value Range Comment   Phosphorus 3.2  2.3 - 4.6 mg/dL   BASIC METABOLIC PANEL     Status: Abnormal   Collection Time   11/21/11  6:03 PM      Component Value Range Comment   Sodium 140  135 - 145 mEq/L    Potassium 2.9 (*) 3.5 - 5.1 mEq/L    Chloride 106  96 - 112 mEq/L    CO2 22  19 - 32 mEq/L    Glucose, Bld 113 (*) 70 - 99 mg/dL    BUN 4 (*) 6 - 23 mg/dL    Creatinine, Ser 8.11  0.50 - 1.35 mg/dL    Calcium 8.9  8.4 - 91.4 mg/dL    GFR  calc non Af Amer >90  >90 mL/min    GFR calc Af Amer >90  >90 mL/min   URINALYSIS, ROUTINE W REFLEX MICROSCOPIC     Status: Abnormal   Collection Time   11/21/11  6:13 PM      Component Value Range Comment   Color, Urine YELLOW  YELLOW    APPearance CLEAR  CLEAR    Specific Gravity, Urine >1.046 (*) 1.005 - 1.030    pH 7.5  5.0 - 8.0    Glucose, UA NEGATIVE  NEGATIVE mg/dL    Hgb urine dipstick NEGATIVE  NEGATIVE    Bilirubin Urine NEGATIVE  NEGATIVE    Ketones, ur NEGATIVE  NEGATIVE mg/dL    Protein, ur NEGATIVE  NEGATIVE mg/dL    Urobilinogen, UA 2.0 (*) 0.0 - 1.0 mg/dL    Nitrite NEGATIVE  NEGATIVE    Leukocytes, UA NEGATIVE  NEGATIVE MICROSCOPIC NOT DONE ON URINES WITH  NEGATIVE PROTEIN, BLOOD, LEUKOCYTES, NITRITE, OR GLUCOSE <1000 mg/dL.  URINE RAPID DRUG SCREEN (HOSP PERFORMED)     Status: Abnormal   Collection Time   11/21/11  6:13 PM      Component Value Range Comment   Opiates POSITIVE (*) NONE DETECTED    Cocaine NONE DETECTED  NONE DETECTED    Benzodiazepines NONE DETECTED  NONE DETECTED    Amphetamines NONE DETECTED  NONE DETECTED    Tetrahydrocannabinol POSITIVE (*) NONE DETECTED    Barbiturates NONE DETECTED  NONE DETECTED   TYPE AND SCREEN     Status: Normal (Preliminary result)   Collection Time   11/21/11  6:52 PM      Component Value Range Comment   ABO/RH(D) O POS      Antibody Screen NEG      Sample Expiration 11/24/2011      Unit Number [id number]      Blood Component Type RBC LR PHER1      Unit division 00      Status of Unit ISSUED      Donor AG Type        Value: NEGATIVE FOR C ANTIGEN NEGATIVE FOR E ANTIGEN NEGATIVE FOR KELL ANTIGEN   Transfusion Status OK TO TRANSFUSE      Crossmatch Result COMPATIBLE     PREPARE RBC (CROSSMATCH)     Status: Normal   Collection Time   11/21/11  7:00 PM      Component Value Range Comment   Order Confirmation ORDER PROCESSED BY BLOOD BANK     MRSA PCR SCREENING     Status: Normal   Collection Time   11/21/11  11:02 PM      Component Value Range Comment   MRSA by PCR NEGATIVE  NEGATIVE   CBC     Status: Abnormal   Collection Time   11/22/11  9:35 AM      Component Value Range Comment   WBC 18.1 (*) 4.0 - 10.5 K/uL    RBC 2.62 (*) 4.22 - 5.81 MIL/uL    Hemoglobin 7.8 (*) 13.0 - 17.0 g/dL    HCT 47.8 (*) 29.5 - 52.0 %    MCV 88.5  78.0 - 100.0 fL    MCH 29.8  26.0 - 34.0 pg    MCHC 33.6  30.0 - 36.0 g/dL    RDW 62.1 (*) 30.8 - 15.5 %    Platelets 406 (*) 150 - 400 K/uL   BASIC METABOLIC PANEL     Status: Abnormal   Collection Time   11/22/11  9:35 AM      Component Value Range Comment   Sodium 139  135 - 145 mEq/L    Potassium 3.2 (*) 3.5 - 5.1 mEq/L    Chloride 107  96 - 112 mEq/L    CO2 21  19 - 32 mEq/L    Glucose, Bld 98  70 - 99 mg/dL    BUN 3 (*) 6 - 23 mg/dL    Creatinine, Ser 6.57  0.50 - 1.35 mg/dL    Calcium 8.3 (*) 8.4 - 10.5 mg/dL    GFR calc non Af Amer >90  >90 mL/min    GFR calc Af Amer >90  >90 mL/min     Medications:    . sodium chloride 100 mL/hr (11/22/11 1007)  . DISCONTD: sodium chloride 75 mL/hr at 11/21/11 1800      . enoxaparin  30 mg Subcutaneous Q12H  . folic acid  1 mg  Oral Daily  .  HYDROmorphone (DILAUDID) injection  2 mg Intravenous Once  . HYDROmorphone  2 mg Intravenous Once  . hydroxyurea  500 mg Oral BID  . OLANZapine  10 mg Oral QHS  . OxyCODONE  80 mg Oral Q12H  . potassium chloride  40 mEq Oral Once  . potassium chloride  40 mEq Oral Once  . DISCONTD: folic acid  1 mg Oral Daily  . DISCONTD:  HYDROmorphone (DILAUDID) injection  2 mg Intravenous Once    I  have reviewed scheduled and prn medications.   Studies/Results: Dg Chest 2 View  11/21/2011  *RADIOLOGY REPORT*  Clinical Data: Shortness of breath.  History of sickle cell disease.  CHEST - 2 VIEW  Comparison: 11/03/2011 and 10/02/2011.  Findings: There is stable mild cardiac enlargement.  Mediastinal contours are stable.  There is stable chronic central airway thickening  without evidence of superimposed airspace disease, edema or pleural effusion.  The osseous structures appear unchanged. Telemetry leads overlie the chest.  IMPRESSION: Stable mild cardiomegaly and chronic lung disease.  No acute cardiopulmonary process.   Original Report Authenticated By: Gerrianne Scale, M.D.    Ct Angio Chest Pe W/cm &/or Wo Cm  11/21/2011  *RADIOLOGY REPORT*  Clinical Data: Pleuritic chest pain.  Shortness of breath.  History of sickle cell.  CT ANGIOGRAPHY CHEST  Technique:  Multidetector CT imaging of the chest using the standard protocol during bolus administration of intravenous contrast. Multiplanar reconstructed images including MIPs were obtained and reviewed to evaluate the vascular anatomy.  Contrast: 80mL OMNIPAQUE IOHEXOL 350 MG/ML SOLN  Comparison: Chest CTA 04/25/2011.  Findings: The pulmonary arteries are adequately opacified with contrast.  There is no evidence of acute pulmonary embolism.  Mild central enlargement of the pulmonary arteries is stable.  There are stable mildly prominent hilar and prevascular lymph nodes bilaterally.  There is no pleural or pericardial effusion.  Right arm PICC remains in place.  Scattered patchy opacities in both lungs are stable and most consistent with atelectasis or scarring.  There is no endobronchial lesion or confluent airspace opacity.  The visualized upper abdomen has a stable appearance with autoinfarction of the spleen.  Chronic endplate deformities throughout the thoracic spine are stable.  IMPRESSION:  1.  No evidence of acute pulmonary embolism. 2.  Stable scattered pulmonary parenchymal scarring or atelectasis. 3.  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   Original Report Authenticated By: Gerrianne Scale, M.D.    Ir Fluoro Guide Cv Line Right  11/21/2011  *RADIOLOGY REPORT*  Clinical Data: Chest pain, sickle cell anemia and lack of  IV access.  PICC LINE PLACEMENT WITH ULTRASOUND AND FLUOROSCOPIC GUIDANCE  Fluoroscopy Time: 0.2 minutes.  The right arm was prepped with chlorhexidine, draped in the usual sterile fashion using maximum barrier technique (cap and mask, sterile gown, sterile gloves, large sterile sheet, hand hygiene and cutaneous antisepsis) and infiltrated locally with 1% Lidocaine.  Ultrasound demonstrated patency of the right brachial vein, and this was documented with an image.  Under real-time ultrasound guidance, this vein was accessed with a 21 gauge micropuncture needle and image documentation was performed.  The needle was exchanged over a guidewire for a peel-away sheath through which a 5 [place] single lumen PICC trimmed to 42 cm was advanced, positioned with its tip at the lower SVC/right atrial junction.  Fluoroscopy during the procedure and fluoro spot radiograph confirms appropriate catheter position.  The catheter was flushed, secured to the skin with Prolene sutures, and covered with a sterile dressing.  Complications:  None  IMPRESSION: Successful right arm PICC line placement with ultrasound and fluoroscopic guidance.  The catheter is ready for use. A power injectable PICC line was placed.   Original Report Authenticated By: Reola Calkins, M.D.    Ir US Guide Vasc Access Right  11/21/2011  *RADIOLOGY REPORT*  Clinical Data: Chest pain, sickle cell anemia and lack of IV access.  PICC LINE PLACEMENT WITH ULTRASOUND AND FLUOROSCOPIC GUIDANCE  Fluoroscopy Time: 0.2 minutes.  The right arm was prepped with chlorhexidine, draped in the usual sterile fashion using maximum barrier technique (cap and mask, sterile gown, sterile gloves, large sterile sheet, hand hygiene and cutaneous antisepsis) and infiltrated locally with 1% Lidocaine.  Ultrasound demonstrated patency of the right brachial vein, and this was documented with an image.  Under real-time ultrasound guidance, this vein was accessed with a 21 gauge  micropuncture needle and image documentation was performed.  The needle was exchanged over a guidewire for a peel-away sheath through which a 5 [place] single lumen PICC trimmed to 42 cm was advanced, positioned with its tip at the lower SVC/right atrial junction.  Fluoroscopy during the procedure and fluoro spot radiograph confirms appropriate catheter position.  The catheter was flushed, secured to the skin with Prolene sutures, and covered with a sterile dressing.  Complications:  None  IMPRESSION: Successful right arm PICC line placement with ultrasound and fluoroscopic guidance.  The catheter is ready for use. A power injectable PICC line was placed.   Original Report Authenticated By: Reola Calkins, M.D.     Patient Active Problem List  Diagnosis  . Sickle cell pain crisis  . Leukocytosis  . Chronic pain  . Hypokalemia  . Metabolic acidosis  . Thrombocytosis  . Hemochromatosis  . Hypomagnesemia  . Acute chest syndrome due to sickle cell crisis  . Diarrhea  . Elevated brain natriuretic peptide (BNP) level  . Bronchitis  . Sickle cell anemia  . Acute embolism and thrombosis of right internal jugular vein  . Avascular necrosis  . Metabolic encephalopathy  . Increased ammonia level   Assessment/Plan: 1) Sickle Cell Crisis- T. Bili 4.7 yesterday on adm with Retic 19.4% on 10/13 (not checked on adm); get CMET now and follow daily; give gentle IVF; anti-emetic prn, anti-pruretic prn; and bowel management regimen; most recent Hgb electrophoresis on 11/07/11 with Hgb A 23.4, Hgb F 5.5, and Hgb S 67.7; on Hydrea and folic Acid daily; Lovenox DVT; follow 2) Acute on Chronic Pain- schedule Dilaudid 4mg  IV every 2 hrs x 24 hrs then resume prn; continue long-acting Oxycontin BID; consider short-acting after scheduled pain med complete; follow 3) CP/DOE/history ACS- CXR on adm stable mild cardiomegaly and chronic lung disease; negative acute; CTA chest negative Pulmonary Emboli;  scarring/atelectasis;  Currently cp free; add Incentive Spirometry; follow 4) Anemia of SCD- Hgb 7.8 this am; (8.9 on adm 11/21/11 and s/p transfusion 1 unit on adm); suspect low Hgb this am due to active hemolysis; will follow daily 5) Leukocytosis- WBC 18.1 this am (25.6 on adm); suspect secondary to inflammatory response with SCD; preliminary Bld Cx and urine Cx pending; afebrile today with T-max 99.4 on yesterday  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; 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; on Lovenox VTE prophylaxis; IV Desferal while inpatient; Will need Exjade 500mg  po daily at discharge; follow 7) Hypokalemia- K level 3.2 this am (  2.9 on adm); chronic low on supplements 8) Insomnia- takes Restoril 7.5mg  at home but reports does better with Ambien 10mg  while inpatient; wants Rx Ambien at discharge 9) Elevated BP's, without history HTN- BP's 142/94 and 146/87 since adm; noted cardiomegaly on CXR; quite smoking ~36yr ago with 15 yr 1/4-1/2 PPD history (smoking 2 black and mild cigars/day); will need to follow closely  10) Mood Disorder- stable on Zyprexa 11) Vascular Access- requiring PICC line placement; has had infected Port-A-Cath in the past aprox 2 yrs ago; needs surgical re-evaluation new port in outpatient setting    12) Disposition- discharge when able to manage his pain at home  The plan of care for this patient has been discussed with Dr. Renea Ee, Chan Sheahan S 11/22/2011 3:00 PM  LOS: 1 day

## 2011-11-23 DIAGNOSIS — D5700 Hb-SS disease with crisis, unspecified: Secondary | ICD-10-CM | POA: Diagnosis not present

## 2011-11-23 DIAGNOSIS — M545 Low back pain: Secondary | ICD-10-CM | POA: Diagnosis not present

## 2011-11-23 DIAGNOSIS — G4700 Insomnia, unspecified: Secondary | ICD-10-CM | POA: Diagnosis not present

## 2011-11-23 DIAGNOSIS — F39 Unspecified mood [affective] disorder: Secondary | ICD-10-CM | POA: Diagnosis not present

## 2011-11-23 DIAGNOSIS — E876 Hypokalemia: Secondary | ICD-10-CM | POA: Diagnosis not present

## 2011-11-23 DIAGNOSIS — R0789 Other chest pain: Secondary | ICD-10-CM | POA: Diagnosis not present

## 2011-11-23 DIAGNOSIS — I10 Essential (primary) hypertension: Secondary | ICD-10-CM | POA: Diagnosis not present

## 2011-11-23 LAB — COMPREHENSIVE METABOLIC PANEL
ALT: 18 U/L (ref 0–53)
AST: 24 U/L (ref 0–37)
Albumin: 3.4 g/dL — ABNORMAL LOW (ref 3.5–5.2)
Alkaline Phosphatase: 89 U/L (ref 39–117)
Chloride: 105 mEq/L (ref 96–112)
Creatinine, Ser: 0.55 mg/dL (ref 0.50–1.35)
Potassium: 3 mEq/L — ABNORMAL LOW (ref 3.5–5.1)
Sodium: 136 mEq/L (ref 135–145)
Total Bilirubin: 3.9 mg/dL — ABNORMAL HIGH (ref 0.3–1.2)

## 2011-11-23 LAB — CBC WITH DIFFERENTIAL/PLATELET
Basophils Absolute: 0.2 10*3/uL — ABNORMAL HIGH (ref 0.0–0.1)
Basophils Relative: 1 % (ref 0–1)
MCHC: 34.5 g/dL (ref 30.0–36.0)
Neutro Abs: 9.4 10*3/uL — ABNORMAL HIGH (ref 1.7–7.7)
Neutrophils Relative %: 58 % (ref 43–77)
Platelets: 474 10*3/uL — ABNORMAL HIGH (ref 150–400)
RDW: 20.6 % — ABNORMAL HIGH (ref 11.5–15.5)
WBC: 16.4 10*3/uL — ABNORMAL HIGH (ref 4.0–10.5)

## 2011-11-23 MED ORDER — POTASSIUM CHLORIDE CRYS ER 20 MEQ PO TBCR
40.0000 meq | EXTENDED_RELEASE_TABLET | Freq: Once | ORAL | Status: AC
  Administered 2011-11-23: 40 meq via ORAL
  Filled 2011-11-23: qty 2

## 2011-11-23 MED ORDER — HYDROMORPHONE HCL 4 MG PO TABS: 4.0000 mg | ORAL_TABLET | ORAL | Status: DC | PRN

## 2011-11-23 MED ORDER — SODIUM CHLORIDE 0.9 % IJ SOLN
10.0000 mL | INTRAMUSCULAR | Status: DC | PRN
  Administered 2011-11-23 – 2011-11-28 (×5): 10 mL

## 2011-11-23 MED ORDER — SPIRONOLACTONE 25 MG PO TABS
25.0000 mg | ORAL_TABLET | Freq: Every day | ORAL | Status: DC
  Administered 2011-11-24 – 2011-11-28 (×6): 25 mg via ORAL
  Filled 2011-11-23 (×6): qty 1

## 2011-11-23 NOTE — Progress Notes (Signed)
Subjective: Patient seen on rounds today. He continues to have bilateral flank and low back pain. he continues to rate  rated 6-7/10 today. He reports having difficulty getting his pain medications every 2hrs when asked, which aggravates and intensifies his pain. Hie is no longer SOB/DOE but continues to complain of an occasional dry nonproductive cough. He currently denies fever, chills, nausea, vomiting, abdominal pain, headache, dizziness, chest pain, LUTS, priapism, or changes to his bowel or bladder functioning. Appetite remains good.     Allergies  Allergen Reactions  . Morphine And Related Hives and Rash    Pt states he is able to tolerate Dilaudid with no reactions.   Current Facility-Administered Medications  Medication Dose Route Frequency Provider Last Rate Last Dose  . deferoxamine (DESFERAL) 2,000 mg in dextrose 5 % 500 mL infusion  2,000 mg Intravenous QHS Lizbeth Bark, FNP 125 mL/hr at 11/22/11 2229 2,000 mg at 11/22/11 2229  . dextrose 5 %-0.45 % sodium chloride infusion   Intravenous Continuous Lizbeth Bark, FNP 100 mL/hr at 11/23/11 1547 100 mL at 11/23/11 1547  . diphenhydrAMINE (BENADRYL) injection 12.5-25 mg  12.5-25 mg Intravenous Q4H PRN Dorothea Ogle, MD   25 mg at 11/23/11 1539  . docusate sodium (COLACE) capsule 100 mg  100 mg Oral BID Lizbeth Bark, FNP   100 mg at 11/23/11 0931  . enoxaparin (LOVENOX) injection 30 mg  30 mg Subcutaneous Q12H Dorothea Ogle, MD   30 mg at 11/23/11 0930  . folic acid (FOLVITE) tablet 1 mg  1 mg Oral Daily Dorothea Ogle, MD   1 mg at 11/23/11 0931  . HYDROmorphone (DILAUDID) injection 2-4 mg  2-4 mg Intravenous Q2H PRN Lizbeth Bark, FNP      . HYDROmorphone (DILAUDID) injection 4 mg  4 mg Intravenous Q2H Lizbeth Bark, FNP   4 mg at 11/23/11 1538  . hydroxyurea (HYDREA) capsule 500 mg  500 mg Oral BID Dorothea Ogle, MD   500 mg at 11/23/11 0930  . lactulose (CHRONULAC) 10 GM/15ML solution 20 g  20 g Oral Daily PRN Lizbeth Bark, FNP      .  OLANZapine (ZYPREXA) tablet 10 mg  10 mg Oral QHS Dorothea Ogle, MD   10 mg at 11/22/11 2156  . oxyCODONE (OXYCONTIN) 12 hr tablet 80 mg  80 mg Oral Q12H Dorothea Ogle, MD   80 mg at 11/23/11 0930  . potassium chloride SA (K-DUR,KLOR-CON) CR tablet 20 mEq  20 mEq Oral BID Lizbeth Bark, FNP   20 mEq at 11/23/11 0931  . potassium chloride SA (K-DUR,KLOR-CON) CR tablet 40 mEq  40 mEq Oral Once Lizbeth Bark, FNP      . promethazine (PHENERGAN) injection 12.5-25 mg  12.5-25 mg Intravenous Q4H PRN Gwenyth Bender, MD   25 mg at 11/23/11 1539  . sodium chloride 0.9 % injection 10-40 mL  10-40 mL Intracatheter PRN Gwenyth Bender, MD   10 mL at 11/23/11 0539  . zolpidem (AMBIEN) tablet 10 mg  10 mg Oral QHS PRN Lizbeth Bark, FNP   10 mg at 11/22/11 2221    Objective: Blood pressure 157/93, pulse 93, temperature 98.9 F (37.2 C), temperature source Oral, resp. rate 18, height 6' (1.829 m), weight 79.062 kg (174 lb 4.8 oz), SpO2 96.00%.  General Appearance: Alert and oriented, cooperative, well developed, well nourished, mild distress  Head: Normocephalic, without obvious abnormality, atraumatic  Eyes: PERRL, slight  strabismus, mild icteric sclera  Nose: Nares normal, septum midline, mucosa normal, no drainage or sinus tenderness.  Throat: Normal lips, mucosa, tongue and gums  Back: Symmetric, mild CVA/flank tenderness, lower back tenderness  Resp: even and nonlabored, diminished bilateral bases, no vocal fremitus; no wheezes/rales/rhonchi  Cardio: S1, S2 regular, no murmurs/clicks/rubs/gallops  GI: Soft, ND, active bowel sounds, no masses, no organomegaly appreciated  Male Genitalia: Deferred, the patient denies priapism  Extremities: Extremities normal, atraumatic, no cyanosis or edema, negative Homans sign, no sign of DVT, no tenderness  Pulses: 2+ and symmetric  Skin: Skin color, texture, turgor normal, no rashes or lesions, numerous tattoos  Neurologic: Grossly normal, CN II-XII intact, no focal deficits    Psych: Appropriate affect   Lab results: Results for orders placed during the hospital encounter of 11/21/11 (from the past 48 hour(s))  BASIC METABOLIC PANEL     Status: Abnormal   Collection Time   11/21/11  6:03 PM      Component Value Range Comment   Sodium 140  135 - 145 mEq/L    Potassium 2.9 (*) 3.5 - 5.1 mEq/L    Chloride 106  96 - 112 mEq/L    CO2 22  19 - 32 mEq/L    Glucose, Bld 113 (*) 70 - 99 mg/dL    BUN 4 (*) 6 - 23 mg/dL    Creatinine, Ser 1.30  0.50 - 1.35 mg/dL    Calcium 8.9  8.4 - 86.5 mg/dL    GFR calc non Af Amer >90  >90 mL/min    GFR calc Af Amer >90  >90 mL/min   URINALYSIS, ROUTINE W REFLEX MICROSCOPIC     Status: Abnormal   Collection Time   11/21/11  6:13 PM      Component Value Range Comment   Color, Urine YELLOW  YELLOW    APPearance CLEAR  CLEAR    Specific Gravity, Urine >1.046 (*) 1.005 - 1.030    pH 7.5  5.0 - 8.0    Glucose, UA NEGATIVE  NEGATIVE mg/dL    Hgb urine dipstick NEGATIVE  NEGATIVE    Bilirubin Urine NEGATIVE  NEGATIVE    Ketones, ur NEGATIVE  NEGATIVE mg/dL    Protein, ur NEGATIVE  NEGATIVE mg/dL    Urobilinogen, UA 2.0 (*) 0.0 - 1.0 mg/dL    Nitrite NEGATIVE  NEGATIVE    Leukocytes, UA NEGATIVE  NEGATIVE MICROSCOPIC NOT DONE ON URINES WITH NEGATIVE PROTEIN, BLOOD, LEUKOCYTES, NITRITE, OR GLUCOSE <1000 mg/dL.  URINE RAPID DRUG SCREEN (HOSP PERFORMED)     Status: Abnormal   Collection Time   11/21/11  6:13 PM      Component Value Range Comment   Opiates POSITIVE (*) NONE DETECTED    Cocaine NONE DETECTED  NONE DETECTED    Benzodiazepines NONE DETECTED  NONE DETECTED    Amphetamines NONE DETECTED  NONE DETECTED    Tetrahydrocannabinol POSITIVE (*) NONE DETECTED    Barbiturates NONE DETECTED  NONE DETECTED   URINE CULTURE     Status: Normal   Collection Time   11/21/11  6:13 PM      Component Value Range Comment   Specimen Description URINE, CLEAN CATCH      Special Requests NONE      Culture  Setup Time 11/22/2011 03:06       Colony Count 6,000 COLONIES/ML      Culture INSIGNIFICANT GROWTH      Report Status 11/22/2011 FINAL     CULTURE, BLOOD (  ROUTINE X 2)     Status: Normal (Preliminary result)   Collection Time   11/21/11  6:40 PM      Component Value Range Comment   Specimen Description BLOOD RIGHT HAND      Special Requests BOTTLES DRAWN AEROBIC AND ANAEROBIC 5CC      Culture  Setup Time 11/22/2011 02:18      Culture        Value:        BLOOD CULTURE RECEIVED NO GROWTH TO DATE CULTURE WILL BE HELD FOR 5 DAYS BEFORE ISSUING A FINAL NEGATIVE REPORT   Report Status PENDING     CULTURE, BLOOD (ROUTINE X 2)     Status: Normal (Preliminary result)   Collection Time   11/21/11  6:52 PM      Component Value Range Comment   Specimen Description BLOOD LEFT HAND      Special Requests BOTTLES DRAWN AEROBIC AND ANAEROBIC 4 CC EACH      Culture  Setup Time 11/22/2011 02:18      Culture        Value:        BLOOD CULTURE RECEIVED NO GROWTH TO DATE CULTURE WILL BE HELD FOR 5 DAYS BEFORE ISSUING A FINAL NEGATIVE REPORT   Report Status PENDING     TYPE AND SCREEN     Status: Normal   Collection Time   11/21/11  6:52 PM      Component Value Range Comment   ABO/RH(D) O POS      Antibody Screen NEG      Sample Expiration 11/24/2011      Unit Number [id number]      Blood Component Type RBC LR PHER1      Unit division 00      Status of Unit ISSUED,FINAL      Donor AG Type        Value: NEGATIVE FOR C ANTIGEN NEGATIVE FOR E ANTIGEN NEGATIVE FOR KELL ANTIGEN   Transfusion Status OK TO TRANSFUSE      Crossmatch Result COMPATIBLE     PREPARE RBC (CROSSMATCH)     Status: Normal   Collection Time   11/21/11  7:00 PM      Component Value Range Comment   Order Confirmation ORDER PROCESSED BY BLOOD BANK     MRSA PCR SCREENING     Status: Normal   Collection Time   11/21/11 11:02 PM      Component Value Range Comment   MRSA by PCR NEGATIVE  NEGATIVE   CBC     Status: Abnormal   Collection Time   11/22/11   9:35 AM      Component Value Range Comment   WBC 18.1 (*) 4.0 - 10.5 K/uL    RBC 2.62 (*) 4.22 - 5.81 MIL/uL    Hemoglobin 7.8 (*) 13.0 - 17.0 g/dL    HCT 78.4 (*) 69.6 - 52.0 %    MCV 88.5  78.0 - 100.0 fL    MCH 29.8  26.0 - 34.0 pg    MCHC 33.6  30.0 - 36.0 g/dL    RDW 29.5 (*) 28.4 - 15.5 %    Platelets 406 (*) 150 - 400 K/uL   BASIC METABOLIC PANEL     Status: Abnormal   Collection Time   11/22/11  9:35 AM      Component Value Range Comment   Sodium 139  135 - 145 mEq/L    Potassium 3.2 (*) 3.5 - 5.1  mEq/L    Chloride 107  96 - 112 mEq/L    CO2 21  19 - 32 mEq/L    Glucose, Bld 98  70 - 99 mg/dL    BUN 3 (*) 6 - 23 mg/dL    Creatinine, Ser 1.61  0.50 - 1.35 mg/dL    Calcium 8.3 (*) 8.4 - 10.5 mg/dL    GFR calc non Af Amer >90  >90 mL/min    GFR calc Af Amer >90  >90 mL/min   COMPREHENSIVE METABOLIC PANEL     Status: Abnormal   Collection Time   11/22/11  5:48 PM      Component Value Range Comment   Sodium 136  135 - 145 mEq/L    Potassium 3.0 (*) 3.5 - 5.1 mEq/L    Chloride 105  96 - 112 mEq/L    CO2 22  19 - 32 mEq/L    Glucose, Bld 98  70 - 99 mg/dL    BUN 3 (*) 6 - 23 mg/dL    Creatinine, Ser 0.96  0.50 - 1.35 mg/dL    Calcium 8.1 (*) 8.4 - 10.5 mg/dL    Total Protein 6.7  6.0 - 8.3 g/dL    Albumin 3.3 (*) 3.5 - 5.2 g/dL    AST 25  0 - 37 U/L    ALT 20  0 - 53 U/L    Alkaline Phosphatase 89  39 - 117 U/L    Total Bilirubin 3.3 (*) 0.3 - 1.2 mg/dL    GFR calc non Af Amer >90  >90 mL/min    GFR calc Af Amer >90  >90 mL/min   CBC WITH DIFFERENTIAL     Status: Abnormal   Collection Time   11/23/11  5:35 AM      Component Value Range Comment   WBC 16.4 (*) 4.0 - 10.5 K/uL    RBC 2.52 (*) 4.22 - 5.81 MIL/uL    Hemoglobin 7.6 (*) 13.0 - 17.0 g/dL    HCT 04.5 (*) 40.9 - 52.0 %    MCV 87.3  78.0 - 100.0 fL    MCH 30.2  26.0 - 34.0 pg    MCHC 34.5  30.0 - 36.0 g/dL    RDW 81.1 (*) 91.4 - 15.5 %    Platelets 474 (*) 150 - 400 K/uL    Neutrophils Relative 58  43  - 77 %    Neutro Abs 9.4 (*) 1.7 - 7.7 K/uL    Lymphocytes Relative 19  12 - 46 %    Lymphs Abs 3.2  0.7 - 4.0 K/uL    Monocytes Relative 14 (*) 3 - 12 %    Monocytes Absolute 2.3 (*) 0.1 - 1.0 K/uL    Eosinophils Relative 8 (*) 0 - 5 %    Eosinophils Absolute 1.3 (*) 0.0 - 0.7 K/uL    Basophils Relative 1  0 - 1 %    Basophils Absolute 0.2 (*) 0.0 - 0.1 K/uL   COMPREHENSIVE METABOLIC PANEL     Status: Abnormal   Collection Time   11/23/11  5:35 AM      Component Value Range Comment   Sodium 136  135 - 145 mEq/L    Potassium 3.0 (*) 3.5 - 5.1 mEq/L    Chloride 105  96 - 112 mEq/L    CO2 22  19 - 32 mEq/L    Glucose, Bld 95  70 - 99 mg/dL    BUN 3 (*) 6 -  23 mg/dL    Creatinine, Ser 7.82  0.50 - 1.35 mg/dL    Calcium 8.3 (*) 8.4 - 10.5 mg/dL    Total Protein 6.6  6.0 - 8.3 g/dL    Albumin 3.4 (*) 3.5 - 5.2 g/dL    AST 24  0 - 37 U/L    ALT 18  0 - 53 U/L    Alkaline Phosphatase 89  39 - 117 U/L    Total Bilirubin 3.9 (*) 0.3 - 1.2 mg/dL    GFR calc non Af Amer >90  >90 mL/min    GFR calc Af Amer >90  >90 mL/min     Medications:    . dextrose 5 % and 0.45% NaCl 100 mL (11/23/11 1547)      . deferoxamine (DESFERAL) IV  2,000 mg Intravenous QHS  . docusate sodium  100 mg Oral BID  . enoxaparin  30 mg Subcutaneous Q12H  . folic acid  1 mg Oral Daily  .  HYDROmorphone (DILAUDID) injection  4 mg Intravenous Q2H  . hydroxyurea  500 mg Oral BID  . OLANZapine  10 mg Oral QHS  . OxyCODONE  80 mg Oral Q12H  . potassium chloride SA  20 mEq Oral BID  . potassium chloride  40 mEq Oral Once    I  have reviewed scheduled and prn medications.   Studies/Results: No results found.  Patient Active Problem List  Diagnosis  . Sickle cell pain crisis  . Leukocytosis  . Chronic pain  . Hypokalemia  . Metabolic acidosis  . Thrombocytosis  . Hemochromatosis  . Hypomagnesemia  . Acute chest syndrome due to sickle cell crisis  . Diarrhea  . Elevated brain natriuretic peptide  (BNP) level  . Bronchitis  . Sickle cell anemia  . Acute embolism and thrombosis of right internal jugular vein  . Avascular necrosis  . Metabolic encephalopathy  . Increased ammonia level   Assessment/Plan: 1) Sickle Cell Crisis- T. Bili 3.9 this am (was 3.3 yesterday and 4.7 on adm); pain has improved somewhat with scheduled IV Dilaudid; will T&S 2 units PRBC's only (re-evaluate need for possible exchange with level of pain and review am labs); cont gentle IVF; anti-emetic prn, anti-pruretic prn; and bowel management regimen; most recent Hgb electrophoresis on 11/07/11 with Hgb A 23.4, Hgb F 5.5, and Hgb S 67.7; on Hydrea and folic Acid daily; Lovenox DVT; follow 2) Acute on Chronic Pain- improving; continue Dilaudid 4mg  IV every 2 hrs prn severe breakthrough pain; continue long-acting Oxycontin BID; will need to resume po Dilaudid at least 24 hours prior to impending discharge;  follow 3) CP/DOE/history ACS- CXR on adm stable mild cardiomegaly and chronic lung disease; negative acute; CTA chest negative Pulmonary Emboli; scarring/atelectasis;  Currently cp free; add Incentive Spirometry; follow 4) Anemia of SCD- Hgb 7.8 this am; (8.9 on adm 11/21/11 and s/p transfusion 1 unit on adm); suspect lsecondary active hemolysis; will follow daily 5) Leukocytosis- WBC 16.4 this am (25.6 on adm); suspect secondary to inflammatory response with SCD; preliminary Bld Cx and urine Cx pending; remains afebrile; follow 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; 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; on Lovenox VTE prophylaxis; remains on IV Desferal while inpatient; Will need Exjade 500mg  po daily at discharge; follow 7) Hypokalemia- K level 3.0 this am (2.9 on adm) despite resuming home dose KCl 20 meq po BID; chronic low on supplements; give extra 40 meq KCl po now; follow 8) Insomnia- takes Restoril 7.5mg  at  home but reports does better with Ambien 10mg  while inpatient; wants Rx Ambien at  discharge 9) HTN-  BP remains elevated (157/93 today) with cardiomegaly on CXR and 15 yr history tobacco abuse; noting history chronic hypokalemia, suspect underlying nephrotic damage from SCD; add aldactone 25mg  po daily; titrate dose and wean off K supplements; must follow K levels and BP's closely  10) Mood Disorder- stable on Zyprexa 11) Vascular Access- requiring PICC line placement; has had infected Port-A-Cath in the past aprox 2 yrs ago; needs surgical re-evaluation new port in outpatient setting    12) Disposition- discharge when able to manage his pain at home  The plan of care for this patient has been discussed with Dr. Renea Ee, Arren Laminack S 11/23/2011 5:19 PM  LOS: 2 days

## 2011-11-23 NOTE — Plan of Care (Signed)
Problem: Phase I Progression Outcomes Goal: Pulmonary hygiene as indicated Outcome: Progressing Incentive spirometer provided to patient, teaching completed with teachback. Patient able to obtain up to 1500. Encouraged pt to continue IS every hour while awake. Pt voiced understanding.

## 2011-11-24 DIAGNOSIS — R0789 Other chest pain: Secondary | ICD-10-CM | POA: Diagnosis not present

## 2011-11-24 DIAGNOSIS — D5700 Hb-SS disease with crisis, unspecified: Secondary | ICD-10-CM | POA: Diagnosis not present

## 2011-11-24 DIAGNOSIS — I10 Essential (primary) hypertension: Secondary | ICD-10-CM | POA: Diagnosis not present

## 2011-11-24 DIAGNOSIS — F39 Unspecified mood [affective] disorder: Secondary | ICD-10-CM | POA: Diagnosis not present

## 2011-11-24 DIAGNOSIS — E876 Hypokalemia: Secondary | ICD-10-CM | POA: Diagnosis not present

## 2011-11-24 DIAGNOSIS — G4700 Insomnia, unspecified: Secondary | ICD-10-CM | POA: Diagnosis not present

## 2011-11-24 LAB — COMPREHENSIVE METABOLIC PANEL
ALT: 19 U/L (ref 0–53)
AST: 25 U/L (ref 0–37)
Albumin: 3.7 g/dL (ref 3.5–5.2)
Alkaline Phosphatase: 96 U/L (ref 39–117)
BUN: 4 mg/dL — ABNORMAL LOW (ref 6–23)
CO2: 21 mEq/L (ref 19–32)
Calcium: 8.8 mg/dL (ref 8.4–10.5)
Chloride: 106 mEq/L (ref 96–112)
Creatinine, Ser: 0.58 mg/dL (ref 0.50–1.35)
GFR calc Af Amer: >90 mL/min (ref >90)
GFR calc non Af Amer: >90 mL/min (ref >90)
Glucose, Bld: 108 mg/dL — ABNORMAL HIGH (ref 70–99)
Potassium: 3.7 mEq/L (ref 3.5–5.1)
Sodium: 137 mEq/L (ref 135–145)
Total Bilirubin: 4.2 mg/dL — ABNORMAL HIGH (ref 0.3–1.2)
Total Protein: 7.3 g/dL (ref 6.0–8.3)

## 2011-11-24 LAB — CBC WITH DIFFERENTIAL/PLATELET
Basophils Absolute: 0.2 10*3/uL — ABNORMAL HIGH (ref 0.0–0.1)
Basophils Relative: 1 % (ref 0–1)
Eosinophils Absolute: 1.2 10*3/uL — ABNORMAL HIGH (ref 0.0–0.7)
Eosinophils Relative: 8 % — ABNORMAL HIGH (ref 0–5)
HCT: 22.4 % — ABNORMAL LOW (ref 39.0–52.0)
Hemoglobin: 7.7 g/dL — ABNORMAL LOW (ref 13.0–17.0)
Lymphocytes Relative: 23 % (ref 12–46)
Lymphs Abs: 3.5 10*3/uL (ref 0.7–4.0)
MCH: 29.7 pg (ref 26.0–34.0)
MCHC: 34.4 g/dL (ref 30.0–36.0)
MCV: 86.5 fL (ref 78.0–100.0)
Monocytes Absolute: 2.1 10*3/uL — ABNORMAL HIGH (ref 0.1–1.0)
Monocytes Relative: 14 % — ABNORMAL HIGH (ref 3–12)
Neutro Abs: 8.1 10*3/uL — ABNORMAL HIGH (ref 1.7–7.7)
Neutrophils Relative %: 54 % (ref 43–77)
Platelets: 538 10*3/uL — ABNORMAL HIGH (ref 150–400)
RBC: 2.59 MIL/uL — ABNORMAL LOW (ref 4.22–5.81)
RDW: 20.4 % — ABNORMAL HIGH (ref 11.5–15.5)
WBC: 15 10*3/uL — ABNORMAL HIGH (ref 4.0–10.5)

## 2011-11-25 DIAGNOSIS — R0789 Other chest pain: Secondary | ICD-10-CM | POA: Diagnosis not present

## 2011-11-25 DIAGNOSIS — I10 Essential (primary) hypertension: Secondary | ICD-10-CM | POA: Diagnosis not present

## 2011-11-25 DIAGNOSIS — D5700 Hb-SS disease with crisis, unspecified: Secondary | ICD-10-CM | POA: Diagnosis not present

## 2011-11-25 DIAGNOSIS — E876 Hypokalemia: Secondary | ICD-10-CM | POA: Diagnosis not present

## 2011-11-25 DIAGNOSIS — F39 Unspecified mood [affective] disorder: Secondary | ICD-10-CM | POA: Diagnosis not present

## 2011-11-25 DIAGNOSIS — G4700 Insomnia, unspecified: Secondary | ICD-10-CM | POA: Diagnosis not present

## 2011-11-25 LAB — TYPE AND SCREEN
ABO/RH(D): O POS
Antibody Screen: NEGATIVE
Unit division: 0
Unit division: 0

## 2011-11-25 LAB — CBC WITH DIFFERENTIAL/PLATELET
Basophils Absolute: 0.3 10*3/uL — ABNORMAL HIGH (ref 0.0–0.1)
Basophils Relative: 2 % — ABNORMAL HIGH (ref 0–1)
Eosinophils Absolute: 1.3 10*3/uL — ABNORMAL HIGH (ref 0.0–0.7)
HCT: 22.2 % — ABNORMAL LOW (ref 39.0–52.0)
Hemoglobin: 7.6 g/dL — ABNORMAL LOW (ref 13.0–17.0)
Lymphocytes Relative: 20 % (ref 12–46)
MCHC: 34.2 g/dL (ref 30.0–36.0)
Monocytes Relative: 17 % — ABNORMAL HIGH (ref 3–12)
Neutro Abs: 7.4 10*3/uL (ref 1.7–7.7)
Neutrophils Relative %: 52 % (ref 43–77)
RDW: 20.2 % — ABNORMAL HIGH (ref 11.5–15.5)
WBC: 14.3 10*3/uL — ABNORMAL HIGH (ref 4.0–10.5)

## 2011-11-25 LAB — COMPREHENSIVE METABOLIC PANEL
ALT: 17 U/L (ref 0–53)
AST: 26 U/L (ref 0–37)
Alkaline Phosphatase: 90 U/L (ref 39–117)
Calcium: 8.7 mg/dL (ref 8.4–10.5)
GFR calc Af Amer: >90 mL/min (ref >90)
Glucose, Bld: 99 mg/dL (ref 70–99)
Potassium: 3.8 mEq/L (ref 3.5–5.1)
Sodium: 136 mEq/L (ref 135–145)
Total Protein: 7.3 g/dL (ref 6.0–8.3)

## 2011-11-25 NOTE — Progress Notes (Signed)
Subjective: Patient seen on rounds today. He continues to have low back pain. he continues to rate 8/10 today. He currently denies fever, chills, nausea, vomiting, abdominal pain, headache, dizziness, chest pain, priapism, or changes to his bowel or bladder functioning. Appetite remains good.     Allergies  Allergen Reactions  . Morphine And Related Hives and Rash    Pt states he is able to tolerate Dilaudid with no reactions.   Current Facility-Administered Medications  Medication Dose Route Frequency Provider Last Rate Last Dose  . deferoxamine (DESFERAL) 2,000 mg in dextrose 5 % 500 mL infusion  2,000 mg Intravenous QHS Lizbeth Bark, FNP 125 mL/hr at 11/24/11 2211 2,000 mg at 11/24/11 2211  . dextrose 5 %-0.45 % sodium chloride infusion   Intravenous Continuous Lizbeth Bark, FNP 100 mL/hr at 11/25/11 0200    . diphenhydrAMINE (BENADRYL) injection 12.5-25 mg  12.5-25 mg Intravenous Q4H PRN Dorothea Ogle, MD   25 mg at 11/25/11 0945  . docusate sodium (COLACE) capsule 100 mg  100 mg Oral BID Lizbeth Bark, FNP   100 mg at 11/25/11 0946  . enoxaparin (LOVENOX) injection 30 mg  30 mg Subcutaneous Q12H Dorothea Ogle, MD   30 mg at 11/25/11 0946  . folic acid (FOLVITE) tablet 1 mg  1 mg Oral Daily Dorothea Ogle, MD   1 mg at 11/25/11 0946  . HYDROmorphone (DILAUDID) injection 2-4 mg  2-4 mg Intravenous Q2H PRN Lizbeth Bark, FNP      . HYDROmorphone (DILAUDID) injection 4 mg  4 mg Intravenous Q2H Lizbeth Bark, FNP   4 mg at 11/25/11 1209  . hydroxyurea (HYDREA) capsule 500 mg  500 mg Oral BID Dorothea Ogle, MD   500 mg at 11/25/11 0946  . lactulose (CHRONULAC) 10 GM/15ML solution 20 g  20 g Oral Daily PRN Lizbeth Bark, FNP      . OLANZapine (ZYPREXA) tablet 10 mg  10 mg Oral QHS Dorothea Ogle, MD   10 mg at 11/24/11 2206  . oxyCODONE (OXYCONTIN) 12 hr tablet 80 mg  80 mg Oral Q12H Dorothea Ogle, MD   80 mg at 11/25/11 0945  . potassium chloride SA (K-DUR,KLOR-CON) CR tablet 20 mEq  20 mEq Oral BID Lizbeth Bark, FNP   20 mEq at 11/25/11 0945  . promethazine (PHENERGAN) injection 12.5-25 mg  12.5-25 mg Intravenous Q4H PRN Gwenyth Bender, MD   25 mg at 11/25/11 0944  . sodium chloride 0.9 % injection 10-40 mL  10-40 mL Intracatheter PRN Gwenyth Bender, MD   10 mL at 11/25/11 0516  . spironolactone (ALDACTONE) tablet 25 mg  25 mg Oral Daily Lizbeth Bark, FNP   25 mg at 11/25/11 0946  . zolpidem (AMBIEN) tablet 10 mg  10 mg Oral QHS PRN Lizbeth Bark, FNP   10 mg at 11/23/11 2221    Objective: Blood pressure 134/86, pulse 82, temperature 98.6 F (37 C), temperature source Oral, resp. rate 18, height 6' (1.829 m), weight 79.062 kg (174 lb 4.8 oz), SpO2 95.00%.  General Appearance: Alert and oriented, cooperative, well developed, well nourished, mild distress  Head: Normocephalic, without obvious abnormality, atraumatic  Eyes: PERRL, slight strabismus, mild icteric sclera  Nose: Nares normal, septum midline, mucosa normal, no drainage or sinus tenderness.  Throat: Normal lips, mucosa, tongue and gums  Back: Symmetric, mild CVA/flank tenderness, lower back tenderness  Resp: even and nonlabored, diminished bilateral bases,  no vocal fremitus; no wheezes/rales/rhonchi  Cardio: S1, S2 regular, no murmurs/clicks/rubs/gallops  GI: Soft, ND, active bowel sounds, no masses, no organomegaly appreciated  Male Genitalia: Deferred, the patient denies priapism  Extremities: Extremities normal, atraumatic, no cyanosis or edema, negative Homans sign, no sign of DVT, no tenderness  Pulses: 2+ and symmetric  Skin: Skin color, texture, turgor normal, no rashes or lesions, numerous tattoos  Neurologic: Grossly normal, CN II-XII intact, no focal deficits  Psych: Appropriate affect   Lab results: Results for orders placed during the hospital encounter of 11/21/11 (from the past 48 hour(s))  PREPARE RBC (CROSSMATCH)     Status: Normal   Collection Time   11/23/11  6:30 PM      Component Value Range Comment   Order  Confirmation ORDER PROCESSED BY BLOOD BANK     CBC WITH DIFFERENTIAL     Status: Abnormal   Collection Time   11/24/11  4:50 AM      Component Value Range Comment   WBC 15.0 (*) 4.0 - 10.5 K/uL    RBC 2.59 (*) 4.22 - 5.81 MIL/uL    Hemoglobin 7.7 (*) 13.0 - 17.0 g/dL    HCT 16.1 (*) 09.6 - 52.0 %    MCV 86.5  78.0 - 100.0 fL    MCH 29.7  26.0 - 34.0 pg    MCHC 34.4  30.0 - 36.0 g/dL    RDW 04.5 (*) 40.9 - 15.5 %    Platelets 538 (*) 150 - 400 K/uL    Neutrophils Relative 54  43 - 77 %    Neutro Abs 8.1 (*) 1.7 - 7.7 K/uL    Lymphocytes Relative 23  12 - 46 %    Lymphs Abs 3.5  0.7 - 4.0 K/uL    Monocytes Relative 14 (*) 3 - 12 %    Monocytes Absolute 2.1 (*) 0.1 - 1.0 K/uL    Eosinophils Relative 8 (*) 0 - 5 %    Eosinophils Absolute 1.2 (*) 0.0 - 0.7 K/uL    Basophils Relative 1  0 - 1 %    Basophils Absolute 0.2 (*) 0.0 - 0.1 K/uL   COMPREHENSIVE METABOLIC PANEL     Status: Abnormal   Collection Time   11/24/11  4:50 AM      Component Value Range Comment   Sodium 137  135 - 145 mEq/L    Potassium 3.7  3.5 - 5.1 mEq/L    Chloride 106  96 - 112 mEq/L    CO2 21  19 - 32 mEq/L    Glucose, Bld 108 (*) 70 - 99 mg/dL    BUN 4 (*) 6 - 23 mg/dL    Creatinine, Ser 8.11  0.50 - 1.35 mg/dL    Calcium 8.8  8.4 - 91.4 mg/dL    Total Protein 7.3  6.0 - 8.3 g/dL    Albumin 3.7  3.5 - 5.2 g/dL    AST 25  0 - 37 U/L    ALT 19  0 - 53 U/L    Alkaline Phosphatase 96  39 - 117 U/L    Total Bilirubin 4.2 (*) 0.3 - 1.2 mg/dL    GFR calc non Af Amer >90  >90 mL/min    GFR calc Af Amer >90  >90 mL/min   CBC WITH DIFFERENTIAL     Status: Abnormal   Collection Time   11/25/11  5:16 AM      Component Value Range Comment  WBC 14.3 (*) 4.0 - 10.5 K/uL    RBC 2.57 (*) 4.22 - 5.81 MIL/uL    Hemoglobin 7.6 (*) 13.0 - 17.0 g/dL    HCT 84.1 (*) 32.4 - 52.0 %    MCV 86.4  78.0 - 100.0 fL    MCH 29.6  26.0 - 34.0 pg    MCHC 34.2  30.0 - 36.0 g/dL    RDW 40.1 (*) 02.7 - 15.5 %    Platelets 534  (*) 150 - 400 K/uL    Neutrophils Relative 52  43 - 77 %    Lymphocytes Relative 20  12 - 46 %    Monocytes Relative 17 (*) 3 - 12 %    Eosinophils Relative 9 (*) 0 - 5 %    Basophils Relative 2 (*) 0 - 1 %    Neutro Abs 7.4  1.7 - 7.7 K/uL    Lymphs Abs 2.9  0.7 - 4.0 K/uL    Monocytes Absolute 2.4 (*) 0.1 - 1.0 K/uL    Eosinophils Absolute 1.3 (*) 0.0 - 0.7 K/uL    Basophils Absolute 0.3 (*) 0.0 - 0.1 K/uL    RBC Morphology POLYCHROMASIA PRESENT      Smear Review LARGE PLATELETS PRESENT     COMPREHENSIVE METABOLIC PANEL     Status: Abnormal   Collection Time   11/25/11  5:16 AM      Component Value Range Comment   Sodium 136  135 - 145 mEq/L    Potassium 3.8  3.5 - 5.1 mEq/L    Chloride 105  96 - 112 mEq/L    CO2 23  19 - 32 mEq/L    Glucose, Bld 99  70 - 99 mg/dL    BUN 5 (*) 6 - 23 mg/dL    Creatinine, Ser 2.53  0.50 - 1.35 mg/dL    Calcium 8.7  8.4 - 66.4 mg/dL    Total Protein 7.3  6.0 - 8.3 g/dL    Albumin 3.6  3.5 - 5.2 g/dL    AST 26  0 - 37 U/L    ALT 17  0 - 53 U/L    Alkaline Phosphatase 90  39 - 117 U/L    Total Bilirubin 3.5 (*) 0.3 - 1.2 mg/dL    GFR calc non Af Amer >90  >90 mL/min    GFR calc Af Amer >90  >90 mL/min     Medications:    . dextrose 5 % and 0.45% NaCl 100 mL/hr at 11/25/11 0200      . deferoxamine (DESFERAL) IV  2,000 mg Intravenous QHS  . docusate sodium  100 mg Oral BID  . enoxaparin  30 mg Subcutaneous Q12H  . folic acid  1 mg Oral Daily  .  HYDROmorphone (DILAUDID) injection  4 mg Intravenous Q2H  . hydroxyurea  500 mg Oral BID  . OLANZapine  10 mg Oral QHS  . OxyCODONE  80 mg Oral Q12H  . potassium chloride SA  20 mEq Oral BID  . spironolactone  25 mg Oral Daily     Studies/Results: No results found.  Patient Active Problem List  Diagnosis  . Sickle cell pain crisis  . Leukocytosis  . Chronic pain  . Hypokalemia  . Metabolic acidosis  . Thrombocytosis  . Hemochromatosis  . Hypomagnesemia  . Acute chest syndrome  due to sickle cell crisis  . Diarrhea  . Elevated brain natriuretic peptide (BNP) level  . Bronchitis  . Sickle cell  anemia  . Acute embolism and thrombosis of right internal jugular vein  . Avascular necrosis  . Metabolic encephalopathy  . Increased ammonia level   Assessment/Plan: Sickle Cell Crisis- T. Bili 3.5 this am, on Hydrea and folic Acid daily; Lovenox DVT; follow  Acute on Chronic Pain-  Slowly improving; continue Dilaudid 4mg  IV every 2 hrs prn severe breakthrough pain; continue long-acting Oxycontin BID  History ACS- CXR on  11/21/11 (Scattered patchy opacities in both lungs are stable and most  consistent with atelectasis or scarring. There is no endobronchial lesion or confluent airspace opacity. )  Encourage Incentive Spirometry  Anemia of SCD- Hgb 7.6 this am; probable active hemolysis Leukocytosis- WBC  14.3 this am probable secondary to inflammatory/reactive response with SCD ; remains afebrile Hemochromatosis- on IV Desferal  Mood Disorder- stable on Zyprexa   Zackory Pudlo P 11/25/2011 12:13 PM  LOS: 4 days

## 2011-11-26 DIAGNOSIS — D7289 Other specified disorders of white blood cells: Secondary | ICD-10-CM | POA: Diagnosis not present

## 2011-11-26 DIAGNOSIS — F39 Unspecified mood [affective] disorder: Secondary | ICD-10-CM | POA: Diagnosis not present

## 2011-11-26 DIAGNOSIS — M2550 Pain in unspecified joint: Secondary | ICD-10-CM | POA: Diagnosis not present

## 2011-11-26 DIAGNOSIS — D5700 Hb-SS disease with crisis, unspecified: Secondary | ICD-10-CM | POA: Diagnosis not present

## 2011-11-26 DIAGNOSIS — G4700 Insomnia, unspecified: Secondary | ICD-10-CM | POA: Diagnosis not present

## 2011-11-26 DIAGNOSIS — E876 Hypokalemia: Secondary | ICD-10-CM | POA: Diagnosis not present

## 2011-11-26 DIAGNOSIS — I10 Essential (primary) hypertension: Secondary | ICD-10-CM | POA: Diagnosis not present

## 2011-11-26 DIAGNOSIS — R0789 Other chest pain: Secondary | ICD-10-CM | POA: Diagnosis not present

## 2011-11-26 LAB — CBC WITH DIFFERENTIAL/PLATELET
Basophils Absolute: 0.3 10*3/uL — ABNORMAL HIGH (ref 0.0–0.1)
Basophils Relative: 2 % — ABNORMAL HIGH (ref 0–1)
Eosinophils Absolute: 1.1 10*3/uL — ABNORMAL HIGH (ref 0.0–0.7)
Eosinophils Relative: 7 % — ABNORMAL HIGH (ref 0–5)
HCT: 23.1 % — ABNORMAL LOW (ref 39.0–52.0)
Hemoglobin: 7.9 g/dL — ABNORMAL LOW (ref 13.0–17.0)
MCH: 29.6 pg (ref 26.0–34.0)
MCHC: 34.2 g/dL (ref 30.0–36.0)
MCV: 86.5 fL (ref 78.0–100.0)
Monocytes Absolute: 2.3 10*3/uL — ABNORMAL HIGH (ref 0.1–1.0)
Monocytes Relative: 14 % — ABNORMAL HIGH (ref 3–12)
RDW: 20.5 % — ABNORMAL HIGH (ref 11.5–15.5)

## 2011-11-26 LAB — COMPREHENSIVE METABOLIC PANEL
AST: 31 U/L (ref 0–37)
Albumin: 3.7 g/dL (ref 3.5–5.2)
BUN: 7 mg/dL (ref 6–23)
Calcium: 8.7 mg/dL (ref 8.4–10.5)
Chloride: 100 mEq/L (ref 96–112)
Creatinine, Ser: 0.61 mg/dL (ref 0.50–1.35)
Total Bilirubin: 3.3 mg/dL — ABNORMAL HIGH (ref 0.3–1.2)

## 2011-11-26 MED ORDER — ALUM & MAG HYDROXIDE-SIMETH 200-200-20 MG/5ML PO SUSP
15.0000 mL | ORAL | Status: DC | PRN
  Administered 2011-11-26: 15 mL via ORAL
  Filled 2011-11-26: qty 30

## 2011-11-26 NOTE — Progress Notes (Signed)
Patient states his chest pain is much improved after Dilaudid.  MD on floor.

## 2011-11-26 NOTE — Progress Notes (Signed)
Subjective:  Chest pain, retrosternal without shortness of breath, sweating or fever or cough. T max 99.5 F  Objective:  Vital Signs in the last 24 hours: Temp:  [98.4 F (36.9 C)-99.5 F (37.5 C)] 98.8 F (37.1 C) (10/26 0600) Pulse Rate:  [74-89] 74  (10/26 0600) Cardiac Rhythm:  [-]  Resp:  [20] 20  (10/26 0600) BP: (116-131)/(75-92) 116/75 mmHg (10/26 0600) SpO2:  [97 %-100 %] 97 % (10/26 0600)  Physical Exam: BP Readings from Last 1 Encounters:  11/26/11 116/75    Wt Readings from Last 1 Encounters:  11/21/11 79.062 kg (174 lb 4.8 oz)    Weight change:   HEENT: [place]/AT, Eyes-Brown, PERL, EOMI, Conjunctiva-Pale, Sclera-icteric. Neck: No JVD, No bruit, Trachea midline. Lungs:  Clear, Bilateral. Chest wall non-tender. Cardiac:  Regular rhythm, normal S1 and S2, no S3.  Abdomen:  Soft, non-tender. Extremities:  No edema present. No cyanosis. No clubbing. CNS: AxOx3, Cranial nerves grossly intact, moves all 4 extremities. Right handed. Skin: Warm and dry.   Intake/Output from previous day: 10/25 0701 - 10/26 0700 In: 4667 [P.O.:480; I.V.:4187] Out: -     Lab Results: BMET    Component Value Date/Time   NA 133* 11/26/2011 0505   K 3.8 11/26/2011 0505   CL 100 11/26/2011 0505   CO2 22 11/26/2011 0505   GLUCOSE 116* 11/26/2011 0505   BUN 7 11/26/2011 0505   CREATININE 0.61 11/26/2011 0505   CALCIUM 8.7 11/26/2011 0505   GFRNONAA >90 11/26/2011 0505   GFRAA >90 11/26/2011 0505   CBC    Component Value Date/Time   WBC 16.4* 11/26/2011 0505   RBC 2.67* 11/26/2011 0505   HGB 7.9* 11/26/2011 0505   HCT 23.1* 11/26/2011 0505   PLT 590* 11/26/2011 0505   MCV 86.5 11/26/2011 0505   MCH 29.6 11/26/2011 0505   MCHC 34.2 11/26/2011 0505   RDW 20.5* 11/26/2011 0505   LYMPHSABS 3.8 11/26/2011 0505   MONOABS 2.3* 11/26/2011 0505   EOSABS 1.1* 11/26/2011 0505   BASOSABS 0.3* 11/26/2011 0505   CARDIAC ENZYMES Lab Results  Component Value Date   CKTOTAL 23  11/13/2011   CKMB 1.5 04/23/2011   TROPONINI <0.30 04/23/2011    Assessment/Plan:  Patient Active Hospital Problem List: Sickle cell pain crisis Leukocytosis Acute Chest pain syndrome Avascular necrosis  Continue medical treatment. Add Maalox prn   LOS: 5 days    Orpah Cobb  MD  11/26/2011, 12:36 PM

## 2011-11-26 NOTE — Progress Notes (Signed)
Patient complaining of sudden burning chest pain that starts at throat and radiates down to chest.  VSS, EKG NSR.  Pain medicagtion given on schedule.  Paged MD on call for Dr. [name]

## 2011-11-27 DIAGNOSIS — I10 Essential (primary) hypertension: Secondary | ICD-10-CM | POA: Diagnosis not present

## 2011-11-27 DIAGNOSIS — D5700 Hb-SS disease with crisis, unspecified: Secondary | ICD-10-CM | POA: Diagnosis not present

## 2011-11-27 DIAGNOSIS — R0789 Other chest pain: Secondary | ICD-10-CM | POA: Diagnosis not present

## 2011-11-27 DIAGNOSIS — M2550 Pain in unspecified joint: Secondary | ICD-10-CM | POA: Diagnosis not present

## 2011-11-27 DIAGNOSIS — F39 Unspecified mood [affective] disorder: Secondary | ICD-10-CM | POA: Diagnosis not present

## 2011-11-27 DIAGNOSIS — G4700 Insomnia, unspecified: Secondary | ICD-10-CM | POA: Diagnosis not present

## 2011-11-27 DIAGNOSIS — E876 Hypokalemia: Secondary | ICD-10-CM | POA: Diagnosis not present

## 2011-11-27 DIAGNOSIS — D7289 Other specified disorders of white blood cells: Secondary | ICD-10-CM | POA: Diagnosis not present

## 2011-11-27 LAB — COMPREHENSIVE METABOLIC PANEL
ALT: 23 U/L (ref 0–53)
Alkaline Phosphatase: 101 U/L (ref 39–117)
BUN: 8 mg/dL (ref 6–23)
CO2: 22 mEq/L (ref 19–32)
GFR calc Af Amer: >90 mL/min (ref >90)
GFR calc non Af Amer: >90 mL/min (ref >90)
Glucose, Bld: 103 mg/dL — ABNORMAL HIGH (ref 70–99)
Potassium: 4.1 mEq/L (ref 3.5–5.1)
Sodium: 131 mEq/L — ABNORMAL LOW (ref 135–145)

## 2011-11-27 LAB — CBC WITH DIFFERENTIAL/PLATELET
Eosinophils Relative: 9 % — ABNORMAL HIGH (ref 0–5)
Hemoglobin: 7.5 g/dL — ABNORMAL LOW (ref 13.0–17.0)
Lymphocytes Relative: 23 % (ref 12–46)
Lymphs Abs: 3.6 10*3/uL (ref 0.7–4.0)
MCV: 86.5 fL (ref 78.0–100.0)
Monocytes Relative: 16 % — ABNORMAL HIGH (ref 3–12)
Neutrophils Relative %: 50 % (ref 43–77)
Platelets: 558 10*3/uL — ABNORMAL HIGH (ref 150–400)
RBC: 2.51 MIL/uL — ABNORMAL LOW (ref 4.22–5.81)
WBC: 15.4 10*3/uL — ABNORMAL HIGH (ref 4.0–10.5)

## 2011-11-27 NOTE — Progress Notes (Signed)
Subjective:  Feels better. T max 98.6 F  Objective:  Vital Signs in the last 24 hours: Temp:  [98 F (36.7 C)-98.6 F (37 C)] 98.6 F (37 C) (10/27 0640) Pulse Rate:  [82-92] 92  (10/27 0640) Cardiac Rhythm:  [-]  Resp:  [18-20] 18  (10/27 0640) BP: (127-136)/(51-93) 135/51 mmHg (10/27 0640) SpO2:  [98 %-99 %] 99 % (10/27 0640)  Physical Exam: BP Readings from Last 1 Encounters:  11/27/11 135/51    Wt Readings from Last 1 Encounters:  11/21/11 79.062 kg (174 lb 4.8 oz)    Weight change:   HEENT: [place]/AT, Eyes-Brown, PERL, EOMI, Conjunctiva-Pale, Sclera-icteric Neck: No JVD, No bruit, Trachea midline. Lungs:  Clear, Bilateral. Cardiac:  Regular rhythm, normal S1 and S2, no S3.  Abdomen:  Soft, non-tender. Extremities:  No edema present. No cyanosis. No clubbing. CNS: AxOx3, Cranial nerves grossly intact, moves all 4 extremities. Right handed. Skin: Warm and dry.   Intake/Output from previous day: 10/26 0701 - 10/27 0700 In: 2668.3 [I.V.:2668.3] Out: -     Lab Results: BMET    Component Value Date/Time   NA 131* 11/27/2011 0345   K 4.1 11/27/2011 0345   CL 99 11/27/2011 0345   CO2 22 11/27/2011 0345   GLUCOSE 103* 11/27/2011 0345   BUN 8 11/27/2011 0345   CREATININE 0.60 11/27/2011 0345   CALCIUM 8.8 11/27/2011 0345   GFRNONAA >90 11/27/2011 0345   GFRAA >90 11/27/2011 0345   CBC    Component Value Date/Time   WBC 15.4* 11/27/2011 0345   RBC 2.51* 11/27/2011 0345   HGB 7.5* 11/27/2011 0345   HCT 21.7* 11/27/2011 0345   PLT 558* 11/27/2011 0345   MCV 86.5 11/27/2011 0345   MCH 29.9 11/27/2011 0345   MCHC 34.6 11/27/2011 0345   RDW 20.5* 11/27/2011 0345   LYMPHSABS 3.6 11/27/2011 0345   MONOABS 2.4* 11/27/2011 0345   EOSABS 1.4* 11/27/2011 0345   BASOSABS 0.3* 11/27/2011 0345   CARDIAC ENZYMES Lab Results  Component Value Date   CKTOTAL 23 11/13/2011   CKMB 1.5 04/23/2011   TROPONINI <0.30 04/23/2011    Assessment/Plan:  Patient Active Hospital  Problem List: Sickle cell pain crisis  Leukocytosis  Acute Chest pain syndrome  Avascular necrosis  Medical treatment   LOS: 6 days    Orpah Cobb  MD  11/27/2011, 12:56 PM

## 2011-11-27 NOTE — Discharge Summary (Signed)
[facility] Discharge Summary   Patient ID: [name] MRN: [id number] DOB/AGE: Jan 27, 1981 31 y.o.  Admit date: 11/12/2011 Discharge date: 11/13/2011  Primary Care Physician:  Gerald Cashing, MD  Admission Diagnoses:  Sickle cell crisis Acute/chronic pain Hypokalemia   Discharge Diagnoses:  Sickle cell crisis Acute/chronic pain Hypokalemia  Sickle cell crisis  Discharge Medications:    Medication List     As of 11/13/2011 10:34 AM    ASK your doctor about these medications         folic acid 1 MG tablet   Commonly known as: FOLVITE   Take 1 mg by mouth daily.      HYDROmorphone 4 MG tablet   Commonly known as: DILAUDID   Take 1 tablet (4 mg total) by mouth every 4 (four) hours as needed. For pain.      hydroxyurea 500 MG capsule   Commonly known as: HYDREA   Take 500 mg by mouth 2 (two) times daily. May take with food to minimize GI side effects.      ibuprofen 800 MG tablet   Commonly known as: ADVIL,MOTRIN   Take 800 mg by mouth every 8 (eight) hours as needed. For pain.      OLANZapine 10 MG tablet   Commonly known as: ZYPREXA   Take 1 tablet (10 mg total) by mouth at bedtime.      oxyCODONE 80 MG 12 hr tablet   Commonly known as: OXYCONTIN   Take 1 tablet (80 mg total) by mouth every 12 (twelve) hours.      potassium chloride SA 20 MEQ tablet   Commonly known as: K-DUR,KLOR-CON   Take 1 tablet (20 mEq total) by mouth 2 (two) times daily.      temazepam 7.5 MG capsule   Commonly known as: RESTORIL   Take 1 capsule (7.5 mg total) by mouth at bedtime as needed for sleep.       Consults:  None  Significant Diagnostic Studies:  Dg Chest 2 View  11/03/2011  *RADIOLOGY REPORT*  Clinical Data: 31 year old male rib pain and sickle cell disease.  CHEST - 2 VIEW  Comparison: 10/02/2011 and earlier.  Findings: Decreased cardiac size. Other mediastinal contours are within normal limits.  Visualized tracheal air column is within normal  limits.  Decreased pulmonary interstitial markings/vascularity.  No pleural effusion.  No confluent or acute pulmonary opacity.  No pneumothorax or edema. Stable visualized osseous structures.  Right upper quadrant surgical clips.  IMPRESSION: No acute cardiopulmonary abnormality.   Original Report Authenticated By: Harley Hallmark, M.D.    Ir Fluoro Guide Cv Line Right  11/05/2011  *RADIOLOGY REPORT*  Clinical Data: Sickle cell crisis and need for central venous access.  PICC LINE PLACEMENT WITH ULTRASOUND AND FLUOROSCOPIC  GUIDANCE  Fluoroscopy Time: 0.1 minutes.  The right arm was prepped with chlorhexidine, draped in the usual sterile fashion using maximum barrier technique (cap and mask, sterile gown, sterile gloves, large sterile sheet, hand hygiene and cutaneous antisepsis) and infiltrated locally with 1% Lidocaine.  Ultrasound demonstrated patency of the right basilic vein, and this was documented with an image.  Under real-time ultrasound guidance, this vein was accessed with a 21 gauge micropuncture needle and image documentation was performed.  The needle was exchanged over a guidewire for a peel-away sheath through which a 5 [place] dual lumen PICC trimmed to 36 cm was advanced, positioned with its tip at the lower SVC/right atrial junction.  Fluoroscopy during the procedure  and fluoro spot radiograph confirms appropriate catheter position.  The catheter was flushed, secured to the skin with Prolene sutures, and covered with a sterile dressing.  Complications:  None  IMPRESSION: Successful right arm PICC line placement with ultrasound and fluoroscopic guidance.  The catheter is ready for use.   Original Report Authenticated By: Reola Calkins, M.D.    Ir US Guide Vasc Access Right  11/05/2011  *RADIOLOGY REPORT*  Clinical Data: Sickle cell crisis and need for central venous access.  PICC LINE PLACEMENT WITH ULTRASOUND AND FLUOROSCOPIC  GUIDANCE  Fluoroscopy Time: 0.1 minutes.  The right arm was  prepped with chlorhexidine, draped in the usual sterile fashion using maximum barrier technique (cap and mask, sterile gown, sterile gloves, large sterile sheet, hand hygiene and cutaneous antisepsis) and infiltrated locally with 1% Lidocaine.  Ultrasound demonstrated patency of the right basilic vein, and this was documented with an image.  Under real-time ultrasound guidance, this vein was accessed with a 21 gauge micropuncture needle and image documentation was performed.  The needle was exchanged over a guidewire for a peel-away sheath through which a 5 [place] dual lumen PICC trimmed to 36 cm was advanced, positioned with its tip at the lower SVC/right atrial junction.  Fluoroscopy during the procedure and fluoro spot radiograph confirms appropriate catheter position.  The catheter was flushed, secured to the skin with Prolene sutures, and covered with a sterile dressing.  Complications:  None  IMPRESSION: Successful right arm PICC line placement with ultrasound and fluoroscopic guidance.  The catheter is ready for use.   Original Report Authenticated By: Reola Calkins, M.D.   [facility] Course:      For complete details please refer to admission H and P, but in brief, [name] is a 31 y.o. African American male continues to complain  of right rib cage and right arm pain but believes he can manage his pain at home. He does not want to be admitted to in patient service. Initially, he was given po medication and IM. Later a floor nurse came over and was able to obtain IV access. Labs were drawn and determined he was in active crisis and was hypokalemic. Replacement was give and d/c wnl. Pain management was marginal but he wanted to try to manage at home. He was treated with IVF for hydration, pain/antiemetic and antipruitics during observation.  At time of d/c he rated his pain as 5/10.  Physical Exam at Discharge:  BP 118/67  Pulse 83  Temp 99 F (37.2 C) (Oral)   Resp 18  Wt 165 lb (74.844 kg)  SpO2 95%  General Appearance: Alert and oriented, cooperative, well developed, well nourished, in no acute distress  Resp: even and nonlabored, no wheezes/rales/rhonchi  Cardio: S1, S2 regular, no murmurs/clicks/rubs/gallops  GI: Soft, non tender, active bowel sounds, no masses, no organomegaly appreciated  Male Genitalia: Deferred, the patient denies priapism  Extremities: Extremities normal, atraumatic, no cyanosis or edema, negative Homans sign, no sign of DVT, no tenderness  Psych: Appropriate affect   Disposition at Discharge: 01-Home and  Self Care  Discharge Orders:   Condition at Discharge:  Stable   Time spent on Discharge:  Greater than 30 minutes.  Signed: [name], NP  [name], Tenelle Andreason 11/13/2011, 10:34 AM

## 2011-11-28 ENCOUNTER — Inpatient Hospital Stay (HOSPITAL_COMMUNITY): Payer: Medicare Other

## 2011-11-28 DIAGNOSIS — M545 Low back pain: Secondary | ICD-10-CM | POA: Diagnosis not present

## 2011-11-28 DIAGNOSIS — D5700 Hb-SS disease with crisis, unspecified: Secondary | ICD-10-CM | POA: Diagnosis not present

## 2011-11-28 DIAGNOSIS — I10 Essential (primary) hypertension: Secondary | ICD-10-CM | POA: Diagnosis not present

## 2011-11-28 DIAGNOSIS — E876 Hypokalemia: Secondary | ICD-10-CM | POA: Diagnosis not present

## 2011-11-28 DIAGNOSIS — G4700 Insomnia, unspecified: Secondary | ICD-10-CM | POA: Diagnosis not present

## 2011-11-28 DIAGNOSIS — D571 Sickle-cell disease without crisis: Secondary | ICD-10-CM | POA: Diagnosis not present

## 2011-11-28 DIAGNOSIS — F39 Unspecified mood [affective] disorder: Secondary | ICD-10-CM | POA: Diagnosis not present

## 2011-11-28 DIAGNOSIS — R0789 Other chest pain: Secondary | ICD-10-CM | POA: Diagnosis not present

## 2011-11-28 LAB — CULTURE, BLOOD (ROUTINE X 2): Culture: NO GROWTH

## 2011-11-28 LAB — CBC WITH DIFFERENTIAL/PLATELET
Basophils Absolute: 0.3 10*3/uL — ABNORMAL HIGH (ref 0.0–0.1)
Eosinophils Absolute: 1.4 10*3/uL — ABNORMAL HIGH (ref 0.0–0.7)
Lymphocytes Relative: 21 % (ref 12–46)
MCHC: 33.5 g/dL (ref 30.0–36.0)
Neutrophils Relative %: 50 % (ref 43–77)
Platelets: 624 10*3/uL — ABNORMAL HIGH (ref 150–400)
RDW: 20.6 % — ABNORMAL HIGH (ref 11.5–15.5)

## 2011-11-28 LAB — COMPREHENSIVE METABOLIC PANEL
AST: 49 U/L — ABNORMAL HIGH (ref 0–37)
Albumin: 3.8 g/dL (ref 3.5–5.2)
CO2: 21 mEq/L (ref 19–32)
Calcium: 8.7 mg/dL (ref 8.4–10.5)
Creatinine, Ser: 0.61 mg/dL (ref 0.50–1.35)
GFR calc non Af Amer: >90 mL/min (ref >90)

## 2011-11-28 IMAGING — CR DG CHEST 2V
2 series · 2 of 2 positions shown · non-contrast
Comparison: Chest x-ray of 10/15/2008

CLINICAL DATA: Chest pain, cough, sickle cell disease

CHEST - 2 VIEW

[w chest pa]
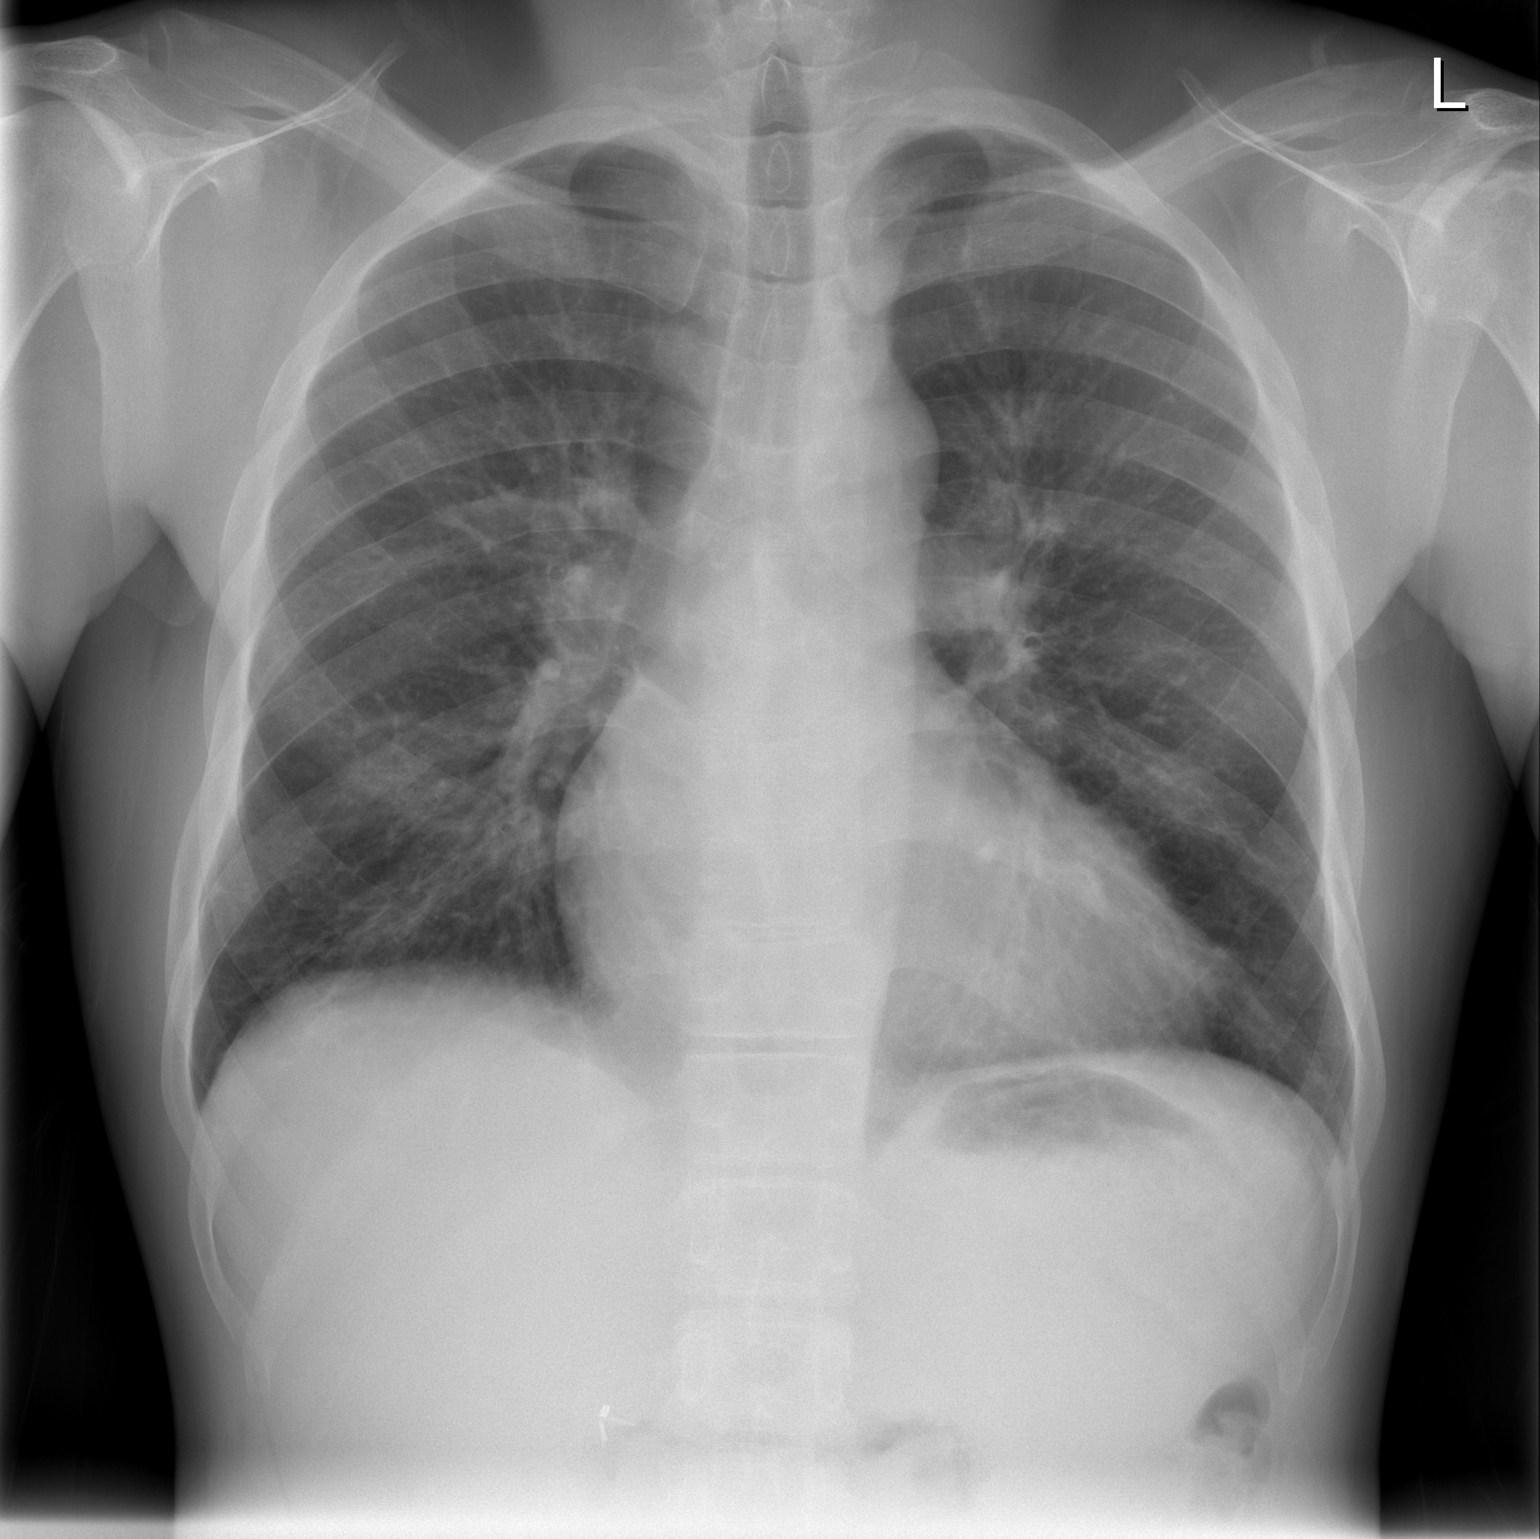

[w chest lat]
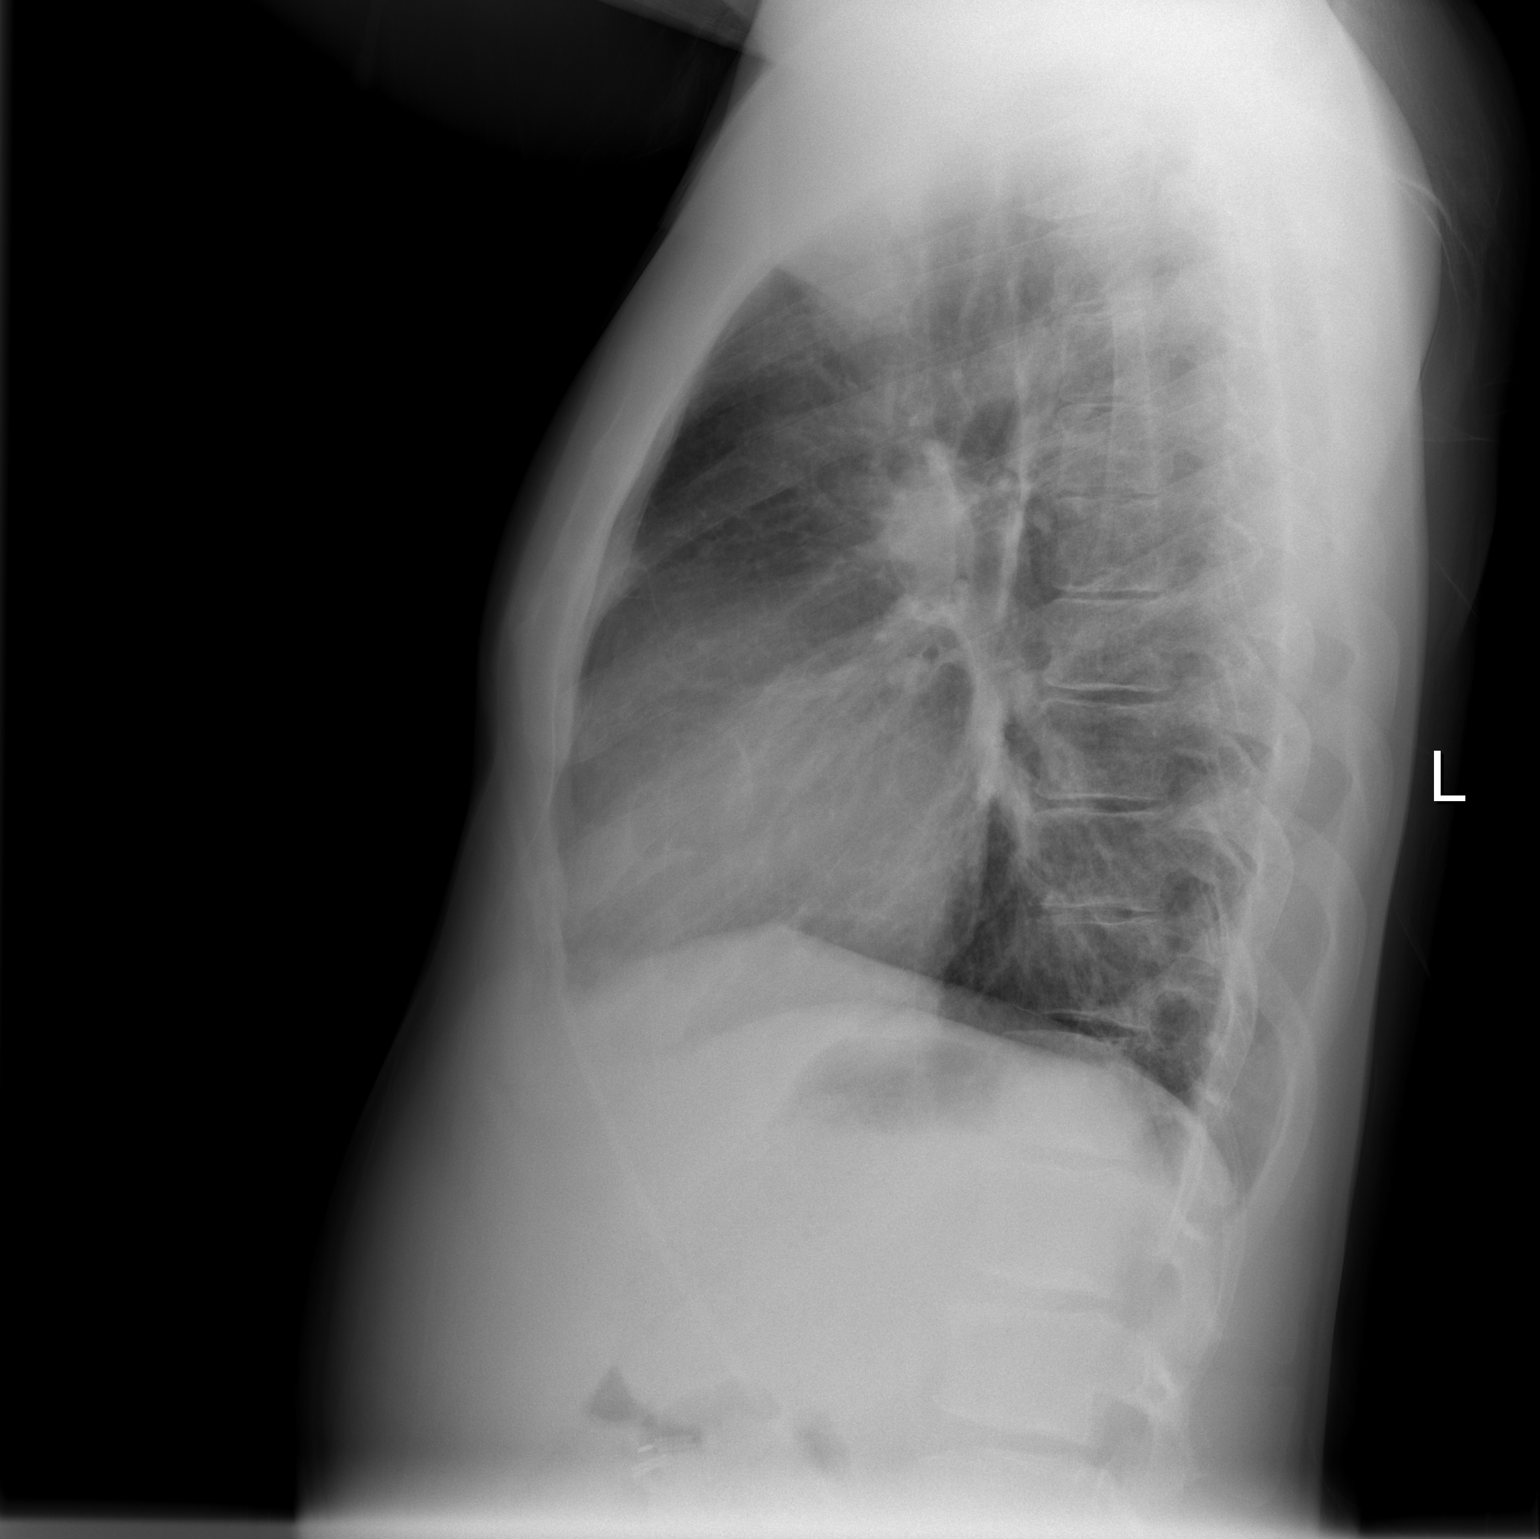

[2 of 2 positions shown; findings below may reference images not displayed]

FINDINGS: No active infiltrate or effusion is seen.  The lungs are
slightly hyperaerated.  There is peribronchial thickening
consistent with bronchitis.  The heart is within upper limits of
normal.  No acute bony abnormality is seen.
IMPRESSION: No pneumonia.  Peribronchial thickening consistent with bronchitis.

## 2011-11-28 MED ORDER — LISINOPRIL 10 MG PO TABS
10.0000 mg | ORAL_TABLET | Freq: Every day | ORAL | Status: DC
  Administered 2011-11-28: 10 mg via ORAL
  Filled 2011-11-28 (×2): qty 1

## 2011-11-28 NOTE — Progress Notes (Signed)
Subjective: Patient seen on rounds today. He continues to have bilateral flank, low back and occasional left breast/chest pain. He currently rates his pain 4-5/10 and thinks that he will be able to manage his pain at home tomorrow. He currently denies SOB/DOE with occasional nonproductive cough (not associated with specific time/activity). He currently denies fever, chills, nausea, vomiting, abdominal pain, headache, dizziness, chest pain, LUTS, priapism, or changes to his bowel or bladder functioning. Last BM yesterday. Appetite remains good.     Allergies  Allergen Reactions  . Morphine And Related Hives and Rash    Pt states he is able to tolerate Dilaudid with no reactions.   Current Facility-Administered Medications  Medication Dose Route Frequency Provider Last Rate Last Dose  . alum & mag hydroxide-simeth (MAALOX/MYLANTA) 200-200-20 MG/5ML suspension 15 mL  15 mL Oral Q4H PRN Ricki Rodriguez, MD   15 mL at 11/26/11 1329  . deferoxamine (DESFERAL) 2,000 mg in dextrose 5 % 500 mL infusion  2,000 mg Intravenous QHS Lizbeth Bark, FNP 125 mL/hr at 11/27/11 2222 2,000 mg at 11/27/11 2222  . dextrose 5 %-0.45 % sodium chloride infusion   Intravenous Continuous Lizbeth Bark, FNP 100 mL/hr at 11/28/11 1112 100 mL/hr at 11/28/11 1112  . diphenhydrAMINE (BENADRYL) injection 12.5-25 mg  12.5-25 mg Intravenous Q4H PRN Dorothea Ogle, MD   25 mg at 11/28/11 1115  . docusate sodium (COLACE) capsule 100 mg  100 mg Oral BID Lizbeth Bark, FNP   100 mg at 11/28/11 0950  . enoxaparin (LOVENOX) injection 30 mg  30 mg Subcutaneous Q12H Dorothea Ogle, MD   30 mg at 11/28/11 0950  . folic acid (FOLVITE) tablet 1 mg  1 mg Oral Daily Dorothea Ogle, MD   1 mg at 11/28/11 0950  . HYDROmorphone (DILAUDID) injection 2-4 mg  2-4 mg Intravenous Q2H PRN Lizbeth Bark, FNP   4 mg at 11/25/11 1310  . HYDROmorphone (DILAUDID) injection 4 mg  4 mg Intravenous Q2H Lizbeth Bark, FNP   4 mg at 11/28/11 1115  . hydroxyurea (HYDREA)  capsule 500 mg  500 mg Oral BID Dorothea Ogle, MD   500 mg at 11/28/11 0950  . lactulose (CHRONULAC) 10 GM/15ML solution 20 g  20 g Oral Daily PRN Lizbeth Bark, FNP      . OLANZapine (ZYPREXA) tablet 10 mg  10 mg Oral QHS Dorothea Ogle, MD   10 mg at 11/27/11 2102  . oxyCODONE (OXYCONTIN) 12 hr tablet 80 mg  80 mg Oral Q12H Dorothea Ogle, MD   80 mg at 11/28/11 0950  . potassium chloride SA (K-DUR,KLOR-CON) CR tablet 20 mEq  20 mEq Oral BID Lizbeth Bark, FNP   20 mEq at 11/28/11 0950  . promethazine (PHENERGAN) injection 12.5-25 mg  12.5-25 mg Intravenous Q4H PRN Gwenyth Bender, MD   25 mg at 11/28/11 1115  . sodium chloride 0.9 % injection 10-40 mL  10-40 mL Intracatheter PRN Gwenyth Bender, MD   10 mL at 11/28/11 0504  . spironolactone (ALDACTONE) tablet 25 mg  25 mg Oral Daily Lizbeth Bark, FNP   25 mg at 11/28/11 0950  . zolpidem (AMBIEN) tablet 10 mg  10 mg Oral QHS PRN Lizbeth Bark, FNP   10 mg at 11/23/11 2221    Objective: Blood pressure 128/99, pulse 88, temperature 98 F (36.7 C), temperature source Oral, resp. rate 18, height 6' (1.829 m), weight 79.062 kg (  174 lb 4.8 oz), SpO2 99.00%.  General Appearance: Alert and oriented, cooperative, well developed, well nourished, mild distress  Head: Normocephalic, without obvious abnormality, atraumatic  Eyes: PERRL, slight strabismus, mild icteric sclera  Nose: Nares normal, septum midline, mucosa normal, no drainage or sinus tenderness.  Throat: Normal lips, mucosa, tongue and gums  Back: Symmetric, mild CVA/flank tenderness, lower back tenderness  Resp: even and nonlabored, diminished bilateral bases, no vocal fremitus; no wheezes/rales/rhonchi  Cardio: S1, S2 regular, no murmurs/clicks/rubs/gallops  GI: Soft, ND, active bowel sounds, no masses, no organomegaly appreciated  Male Genitalia: Deferred, the patient denies priapism  Extremities: Extremities normal, atraumatic, no cyanosis or edema, negative Homans sign, no sign of DVT, no tenderness    Pulses: 2+ and symmetric  Skin: Skin color, texture, turgor normal, no rashes or lesions, numerous tattoos  Neurologic: Grossly normal, CN II-XII intact, no focal deficits  Psych: Appropriate affect   Lab results: Results for orders placed during the hospital encounter of 11/21/11 (from the past 48 hour(s))  CBC WITH DIFFERENTIAL     Status: Abnormal   Collection Time   11/27/11  3:45 AM      Component Value Range Comment   WBC 15.4 (*) 4.0 - 10.5 K/uL    RBC 2.51 (*) 4.22 - 5.81 MIL/uL    Hemoglobin 7.5 (*) 13.0 - 17.0 g/dL    HCT 16.1 (*) 09.6 - 52.0 %    MCV 86.5  78.0 - 100.0 fL    MCH 29.9  26.0 - 34.0 pg    MCHC 34.6  30.0 - 36.0 g/dL    RDW 04.5 (*) 40.9 - 15.5 %    Platelets 558 (*) 150 - 400 K/uL    Neutrophils Relative 50  43 - 77 %    Neutro Abs 7.8 (*) 1.7 - 7.7 K/uL    Lymphocytes Relative 23  12 - 46 %    Lymphs Abs 3.6  0.7 - 4.0 K/uL    Monocytes Relative 16 (*) 3 - 12 %    Monocytes Absolute 2.4 (*) 0.1 - 1.0 K/uL    Eosinophils Relative 9 (*) 0 - 5 %    Eosinophils Absolute 1.4 (*) 0.0 - 0.7 K/uL    Basophils Relative 2 (*) 0 - 1 %    Basophils Absolute 0.3 (*) 0.0 - 0.1 K/uL   COMPREHENSIVE METABOLIC PANEL     Status: Abnormal   Collection Time   11/27/11  3:45 AM      Component Value Range Comment   Sodium 131 (*) 135 - 145 mEq/L    Potassium 4.1  3.5 - 5.1 mEq/L    Chloride 99  96 - 112 mEq/L    CO2 22  19 - 32 mEq/L    Glucose, Bld 103 (*) 70 - 99 mg/dL    BUN 8  6 - 23 mg/dL    Creatinine, Ser 8.11  0.50 - 1.35 mg/dL    Calcium 8.8  8.4 - 91.4 mg/dL    Total Protein 7.5  6.0 - 8.3 g/dL    Albumin 3.7  3.5 - 5.2 g/dL    AST 40 (*) 0 - 37 U/L    ALT 23  0 - 53 U/L    Alkaline Phosphatase 101  39 - 117 U/L    Total Bilirubin 3.3 (*) 0.3 - 1.2 mg/dL    GFR calc non Af Amer >90  >90 mL/min    GFR calc Af Amer >90  >90 mL/min  CBC WITH DIFFERENTIAL     Status: Abnormal   Collection Time   11/28/11  5:10 AM      Component Value Range Comment    WBC 15.2 (*) 4.0 - 10.5 K/uL    RBC 2.41 (*) 4.22 - 5.81 MIL/uL    Hemoglobin 7.0 (*) 13.0 - 17.0 g/dL    HCT 40.9 (*) 81.1 - 52.0 %    MCV 86.7  78.0 - 100.0 fL    MCH 29.0  26.0 - 34.0 pg    MCHC 33.5  30.0 - 36.0 g/dL    RDW 91.4 (*) 78.2 - 15.5 %    Platelets 624 (*) 150 - 400 K/uL    Neutrophils Relative 50  43 - 77 %    Lymphocytes Relative 21  12 - 46 %    Monocytes Relative 18 (*) 3 - 12 %    Eosinophils Relative 9 (*) 0 - 5 %    Basophils Relative 2 (*) 0 - 1 %    Neutro Abs 7.6  1.7 - 7.7 K/uL    Lymphs Abs 3.2  0.7 - 4.0 K/uL    Monocytes Absolute 2.7 (*) 0.1 - 1.0 K/uL    Eosinophils Absolute 1.4 (*) 0.0 - 0.7 K/uL    Basophils Absolute 0.3 (*) 0.0 - 0.1 K/uL    RBC Morphology SICKLE CELLS      WBC Morphology ATYPICAL LYMPHOCYTES      Smear Review LARGE PLATELETS PRESENT     COMPREHENSIVE METABOLIC PANEL     Status: Abnormal   Collection Time   11/28/11  5:10 AM      Component Value Range Comment   Sodium 133 (*) 135 - 145 mEq/L    Potassium 3.9  3.5 - 5.1 mEq/L    Chloride 101  96 - 112 mEq/L    CO2 21  19 - 32 mEq/L    Glucose, Bld 95  70 - 99 mg/dL    BUN 8  6 - 23 mg/dL    Creatinine, Ser 9.56  0.50 - 1.35 mg/dL    Calcium 8.7  8.4 - 21.3 mg/dL    Total Protein 7.6  6.0 - 8.3 g/dL    Albumin 3.8  3.5 - 5.2 g/dL    AST 49 (*) 0 - 37 U/L    ALT 28  0 - 53 U/L    Alkaline Phosphatase 98  39 - 117 U/L    Total Bilirubin 2.9 (*) 0.3 - 1.2 mg/dL    GFR calc non Af Amer >90  >90 mL/min    GFR calc Af Amer >90  >90 mL/min     Medications:    . dextrose 5 % and 0.45% NaCl 100 mL/hr (11/28/11 1112)      . deferoxamine (DESFERAL) IV  2,000 mg Intravenous QHS  . docusate sodium  100 mg Oral BID  . enoxaparin  30 mg Subcutaneous Q12H  . folic acid  1 mg Oral Daily  .  HYDROmorphone (DILAUDID) injection  4 mg Intravenous Q2H  . hydroxyurea  500 mg Oral BID  . OLANZapine  10 mg Oral QHS  . OxyCODONE  80 mg Oral Q12H  . potassium chloride SA  20 mEq Oral BID   . spironolactone  25 mg Oral Daily    I  have reviewed scheduled and prn medications.   Studies/Results: No results found.  Patient Active Problem List  Diagnosis  . Sickle cell pain crisis  . Leukocytosis  .  Chronic pain  . Hypokalemia  . Metabolic acidosis  . Thrombocytosis  . Hemochromatosis  . Hypomagnesemia  . Acute chest syndrome due to sickle cell crisis  . Diarrhea  . Elevated brain natriuretic peptide (BNP) level  . Bronchitis  . Sickle cell anemia  . Acute embolism and thrombosis of right internal jugular vein  . Avascular necrosis  . Metabolic encephalopathy  . Increased ammonia level   Assessment/Plan: 1) Sickle Cell Crisis- resolving with T. Bili 2.9 this am ( 4.7 on adm); pain continues to improve with discharge planned for am; cont gentle IV hydration at decreased rate; anti-emetic prn, anti-pruretic prn; and bowel management regimen; most recent Hgb electrophoresis on 11/07/11 with Hgb A 23.4, Hgb F 5.5%, and Hgb S 67.7%; on Hydrea and folic Acid daily; Lovenox DVT; follow 2) Acute on Chronic Pain- improving; continue Dilaudid 4mg  IV every 2 hrs prn severe breakthrough pain; continue long-acting Oxycontin BID; follow 3) CP/DOE/history ACS- resolving; CXR on adm stable mild cardiomegaly and chronic lung disease; negative acute; CTA chest negative Pulmonary Emboli; scarring/atelectasis; continue Incentive Spirometry; follow 4) Anemia of SCD- Hgb 7.0 this am; (8.9 on adm 11/21/11 and s/p transfusion 1 unit on adm); suspect secondary active hemolysis; follow am labs closely prior to discharge pending; transfuse 1 unit if <7.0 5) Leukocytosis- WBC continues to trend down and 15.2 this am (25.6 on adm); BLD and urine Cx all negative); suspect secondary to inflammatory response with SCD; follow 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; 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; on Lovenox VTE prophylaxis; remains on IV Desferal while inpatient; Will need  Exjade 500mg  po daily at discharge; follow 7) Hypokalemia- K level 3.9 this am despite resuming home dose KCl 20 meq po BID and adding aldactone to his medication regimen; get Renal Ultrasound to rule out RTA nephrosclerosis; follow 8) Insomnia- takes Restoril 7.5mg  at home but reports does better with Ambien 10mg  while inpatient; wants Rx Ambien at discharge 9) HTN-  SBP significantly improved with adding aldactone 25mg  daily (114/80); however, still requiring po KCl BID;  with cardiomegaly on CXR and 15 yr history tobacco abuse; noting history chronic hypokalemia, suspect underlying nephrotic damage from SCD; discontinue aldactone 25mg  po daily; add Lisinopril 10mg  po daily for cardiorenal protection; will need Rx at discharge and f/u labs in outpatient clinic 1 wk  10) Mood Disorder- stable on Zyprexa 11) Vascular Access- requiring PICC line placement; has had infected Port-A-Cath in the past aprox 2 yrs ago; needs surgical re-evaluation new port in outpatient setting    12) Disposition- discharge home tomorrow ~2 pm; reports able to discharge home off IV Dilaudid; therefore, will continue current regimen; resume po Dilaudid at home; will need new Rx Dilaudid, Oxycotin as well.   The plan of care for this patient has been discussed with Dr. Renea Ee, Alyssah Algeo S 11/28/2011 11:36 AM  LOS: 7 days

## 2011-11-29 DIAGNOSIS — R0789 Other chest pain: Secondary | ICD-10-CM | POA: Diagnosis not present

## 2011-11-29 DIAGNOSIS — G4700 Insomnia, unspecified: Secondary | ICD-10-CM | POA: Diagnosis not present

## 2011-11-29 DIAGNOSIS — E876 Hypokalemia: Secondary | ICD-10-CM | POA: Diagnosis not present

## 2011-11-29 DIAGNOSIS — I10 Essential (primary) hypertension: Secondary | ICD-10-CM | POA: Diagnosis not present

## 2011-11-29 DIAGNOSIS — D5700 Hb-SS disease with crisis, unspecified: Secondary | ICD-10-CM | POA: Diagnosis not present

## 2011-11-29 DIAGNOSIS — M545 Low back pain: Secondary | ICD-10-CM | POA: Diagnosis not present

## 2011-11-29 DIAGNOSIS — F39 Unspecified mood [affective] disorder: Secondary | ICD-10-CM | POA: Diagnosis not present

## 2011-11-29 LAB — CBC WITH DIFFERENTIAL/PLATELET
Basophils Relative: 2 % — ABNORMAL HIGH (ref 0–1)
Eosinophils Relative: 8 % — ABNORMAL HIGH (ref 0–5)
Hemoglobin: 6.8 g/dL — CL (ref 13.0–17.0)
Lymphocytes Relative: 26 % (ref 12–46)
MCH: 29.2 pg (ref 26.0–34.0)
Monocytes Relative: 17 % — ABNORMAL HIGH (ref 3–12)
Neutrophils Relative %: 47 % (ref 43–77)
RBC: 2.33 MIL/uL — ABNORMAL LOW (ref 4.22–5.81)

## 2011-11-29 LAB — COMPREHENSIVE METABOLIC PANEL
ALT: 42 U/L (ref 0–53)
AST: 63 U/L — ABNORMAL HIGH (ref 0–37)
CO2: 22 mEq/L (ref 19–32)
Chloride: 101 mEq/L (ref 96–112)
GFR calc non Af Amer: >90 mL/min (ref >90)
Potassium: 4 mEq/L (ref 3.5–5.1)
Sodium: 133 mEq/L — ABNORMAL LOW (ref 135–145)
Total Bilirubin: 3.2 mg/dL — ABNORMAL HIGH (ref 0.3–1.2)

## 2011-11-29 MED ORDER — OXYCODONE HCL 80 MG PO TB12: 80.0000 mg | ORAL_TABLET | Freq: Two times a day (BID) | ORAL | Status: DC

## 2011-11-29 MED ORDER — DEFERASIROX 500 MG PO TBSO: 500.0000 mg | ORAL_TABLET | Freq: Every day | ORAL | Status: DC

## 2011-11-29 MED ORDER — ZOLPIDEM TARTRATE 10 MG PO TABS: 10.0000 mg | ORAL_TABLET | Freq: Every evening | ORAL | Status: DC | PRN

## 2011-11-29 MED ORDER — ACETAMINOPHEN 325 MG PO TABS
650.0000 mg | ORAL_TABLET | Freq: Once | ORAL | Status: AC
  Administered 2011-11-29: 650 mg via ORAL
  Filled 2011-11-29: qty 2

## 2011-11-29 MED ORDER — HYDROMORPHONE HCL 4 MG PO TABS: 4.0000 mg | ORAL_TABLET | ORAL | Status: DC | PRN

## 2011-11-29 MED ORDER — DIPHENHYDRAMINE HCL 50 MG/ML IJ SOLN: 25.0000 mg | Freq: Once | INTRAMUSCULAR | Status: DC

## 2011-11-29 MED ORDER — LISINOPRIL 10 MG PO TABS: 10.0000 mg | ORAL_TABLET | Freq: Every day | ORAL | Status: DC

## 2011-11-29 NOTE — Progress Notes (Signed)
I have notified Dr. [name] of pt critical lab value of hgb 6.8.

## 2011-11-29 NOTE — Discharge Summary (Signed)
[facility] Discharge Summary   Patient ID: [name] MRN: [id number] DOB/AGE: 1980-03-04 31 y.o.  Admit date: 11/21/2011 Discharge date: 11/29/2011  Primary Care Physician:  [name], MD  Admission Diagnoses:  Sickle Cell Crisis  Discharge Diagnoses:   Sickle Cell Crisis Acute on Chronic Pain Chest pain, atypical, resolving  Anemia of SCD Leukocytosis, improved Secondary Hemochromatosis Hypokalemia, chronic Insomnia, chronic, improved HTN Mood Disorder, stable Vascular Access  Discharge Medications:    Medication List     As of 11/29/2011 10:30 AM    STOP taking these medications         ibuprofen 800 MG tablet   Commonly known as: ADVIL,MOTRIN      temazepam 7.5 MG capsule   Commonly known as: RESTORIL      TAKE these medications         deferasirox 500 MG disintegrating tablet   Commonly known as: EXJADE   Take 1 tablet (500 mg total) by mouth daily before breakfast.      folic acid 1 MG tablet   Commonly known as: FOLVITE   Take 1 mg by mouth daily.      HYDROmorphone 4 MG tablet   Commonly known as: DILAUDID   Take 1 tablet (4 mg total) by mouth [name] 4 (four) hours as needed for pain. For pain.      hydroxyurea 500 MG capsule   Commonly known as: HYDREA   Take 500 mg by mouth 2 (two) times daily. May take with food to minimize GI side effects.      lisinopril 10 MG tablet   Commonly known as: PRINIVIL,ZESTRIL   Take 1 tablet (10 mg total) by mouth daily at 10 pm.      OLANZapine 10 MG tablet   Commonly known as: ZYPREXA   Take 10 mg by mouth at bedtime.      oxyCODONE 80 MG 12 hr tablet   Commonly known as: OXYCONTIN   Take 1 tablet (80 mg total) by mouth [name] 12 (twelve) hours.      potassium chloride SA 20 MEQ tablet   Commonly known as: K-DUR,KLOR-CON   Take 1 tablet (20 mEq total) by mouth 2 (two) times daily.      zolpidem 10 MG tablet   Commonly known as: AMBIEN   Take 1 tablet (10 mg total) by  mouth at bedtime as needed for sleep.         Consults:  None  Significant Diagnostic Studies:  Dg Chest 2 View  11/21/2011  *RADIOLOGY REPORT*  Clinical Data: Shortness of breath.  History of sickle cell disease.  CHEST - 2 VIEW  Comparison: 11/03/2011 and 10/02/2011.  Findings: There is stable mild cardiac enlargement.  Mediastinal contours are stable.  There is stable chronic central airway thickening without evidence of superimposed airspace disease, edema or pleural effusion.  The osseous structures appear unchanged. Telemetry leads overlie the chest.  IMPRESSION: Stable mild cardiomegaly and chronic lung disease.  No acute cardiopulmonary process.   Original Report Authenticated By: Gerrianne Scale, M.D.    Dg Chest 2 View  11/03/2011  *RADIOLOGY REPORT*  Clinical Data: 31 year old male rib pain and sickle cell disease.  CHEST - 2 VIEW  Comparison: 10/02/2011 and earlier.  Findings: Decreased cardiac size. Other mediastinal contours are within normal limits.  Visualized tracheal air column is within normal limits.  Decreased pulmonary interstitial markings/vascularity.  No pleural effusion.  No confluent or acute pulmonary opacity.  No pneumothorax  or edema. Stable visualized osseous structures.  Right upper quadrant surgical clips.  IMPRESSION: No acute cardiopulmonary abnormality.   Original Report Authenticated By: Harley Hallmark, M.D.    Ct Angio Chest Pe W/cm &/or Wo Cm  11/21/2011  *RADIOLOGY REPORT*  Clinical Data: Pleuritic chest pain.  Shortness of breath.  History of sickle cell.  CT ANGIOGRAPHY CHEST  Technique:  Multidetector CT imaging of the chest using the standard protocol during bolus administration of intravenous contrast. Multiplanar reconstructed images including MIPs were obtained and reviewed to evaluate the vascular anatomy.  Contrast: 80mL OMNIPAQUE IOHEXOL 350 MG/ML SOLN  Comparison: Chest CTA 04/25/2011.  Findings: The pulmonary arteries are adequately opacified  with contrast.  There is no evidence of acute pulmonary embolism.  Mild central enlargement of the pulmonary arteries is stable.  There are stable mildly prominent hilar and prevascular lymph nodes bilaterally.  There is no pleural or pericardial effusion.  Right arm PICC remains in place.  Scattered patchy opacities in both lungs are stable and most consistent with atelectasis or scarring.  There is no endobronchial lesion or confluent airspace opacity.  The visualized upper abdomen has a stable appearance with autoinfarction of the spleen.  Chronic endplate deformities throughout the thoracic spine are stable.  IMPRESSION:  1.  No evidence of acute pulmonary embolism. 2.  Stable scattered pulmonary parenchymal scarring or atelectasis. 3.  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   Original Report Authenticated By: Gerrianne Scale, M.D.    US Renal  11/28/2011  *RADIOLOGY REPORT*  Clinical Data: Sickle cell disease.  Hypertension.  RENAL/URINARY TRACT ULTRASOUND COMPLETE  Comparison:  Abdominal ultrasound 01/11/2011 and 09/10/2011.  Findings:  Right Kidney:  Measures 14.8 cm.  No stone, mass or hydronephrosis. 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  Left Kidney:  Measures 12.4 cm.  No stone, mass or hydronephrosis.  Bladder:  Bilateral ureteral jets are identified.  Urinary bladder is otherwise unremarkable.  IMPRESSION: No acute finding.  Stable compared to prior exam.   Original Report Authenticated By: Bernadene Bell. Maricela Curet, M.D.    Ir Fluoro Guide Cv Line Right  11/21/2011  *RADIOLOGY REPORT*  Clinical Data: Chest pain, sickle cell anemia and lack of IV access.  PICC LINE PLACEMENT WITH ULTRASOUND AND FLUOROSCOPIC GUIDANCE  Fluoroscopy Time: 0.2 minutes.  The right arm was prepped with chlorhexidine, draped in the usual sterile fashion using maximum barrier technique  (cap and mask, sterile gown, sterile gloves, large sterile sheet, hand hygiene and cutaneous antisepsis) and infiltrated locally with 1% Lidocaine.  Ultrasound demonstrated patency of the right brachial vein, and this was documented with an image.  Under real-time ultrasound guidance, this vein was accessed with a 21 gauge micropuncture needle and image documentation was performed.  The needle was exchanged over a guidewire for a peel-away sheath through which a 5 [place] single lumen PICC trimmed to 42 cm was advanced, positioned with its tip at the lower SVC/right atrial junction.  Fluoroscopy during the procedure and fluoro spot radiograph confirms appropriate catheter position.  The catheter was flushed, secured to the skin with Prolene sutures, and covered with a sterile dressing.  Complications:  None  IMPRESSION: Successful right arm PICC line placement with ultrasound and fluoroscopic guidance.  The catheter is ready for use. A power injectable PICC line was placed.   Original Report Authenticated By: Reola Calkins, M.D.  Ir Fluoro Guide Cv Line Right  11/05/2011  *RADIOLOGY REPORT*  Clinical Data: Sickle cell crisis and need for central venous access.  PICC LINE PLACEMENT WITH ULTRASOUND AND FLUOROSCOPIC  GUIDANCE  Fluoroscopy Time: 0.1 minutes.  The right arm was prepped with chlorhexidine, draped in the usual sterile fashion using maximum barrier technique (cap and mask, sterile gown, sterile gloves, large sterile sheet, hand hygiene and cutaneous antisepsis) and infiltrated locally with 1% Lidocaine.  Ultrasound demonstrated patency of the right basilic vein, and this was documented with an image.  Under real-time ultrasound guidance, this vein was accessed with a 21 gauge micropuncture needle and image documentation was performed.  The needle was exchanged over a guidewire for a peel-away sheath through which a 5 [place] dual lumen PICC trimmed to 36 cm was advanced, positioned with its tip at  the lower SVC/right atrial junction.  Fluoroscopy during the procedure and fluoro spot radiograph confirms appropriate catheter position.  The catheter was flushed, secured to the skin with Prolene sutures, and covered with a sterile dressing.  Complications:  None  IMPRESSION: Successful right arm PICC line placement with ultrasound and fluoroscopic guidance.  The catheter is ready for use.   Original Report Authenticated By: Reola Calkins, M.D.    Ir US Guide Vasc Access Right  11/21/2011  *RADIOLOGY REPORT*  Clinical Data: Chest pain, sickle cell anemia and lack of IV access.  PICC LINE PLACEMENT WITH ULTRASOUND AND FLUOROSCOPIC GUIDANCE  Fluoroscopy Time: 0.2 minutes.  The right arm was prepped with chlorhexidine, draped in the usual sterile fashion using maximum barrier technique (cap and mask, sterile gown, sterile gloves, large sterile sheet, hand hygiene and cutaneous antisepsis) and infiltrated locally with 1% Lidocaine.  Ultrasound demonstrated patency of the right brachial vein, and this was documented with an image.  Under real-time ultrasound guidance, this vein was accessed with a 21 gauge micropuncture needle and image documentation was performed.  The needle was exchanged over a guidewire for a peel-away sheath through which a 5 [place] single lumen PICC trimmed to 42 cm was advanced, positioned with its tip at the lower SVC/right atrial junction.  Fluoroscopy during the procedure and fluoro spot radiograph confirms appropriate catheter position.  The catheter was flushed, secured to the skin with Prolene sutures, and covered with a sterile dressing.  Complications:  None  IMPRESSION: Successful right arm PICC line placement with ultrasound and fluoroscopic guidance.  The catheter is ready for use. A power injectable PICC line was placed.   Original Report Authenticated By: Reola Calkins, M.D.    Ir US Guide Vasc Access Right  11/05/2011  *RADIOLOGY REPORT*  Clinical Data: Sickle cell  crisis and need for central venous access.  PICC LINE PLACEMENT WITH ULTRASOUND AND FLUOROSCOPIC  GUIDANCE  Fluoroscopy Time: 0.1 minutes.  The right arm was prepped with chlorhexidine, draped in the usual sterile fashion using maximum barrier technique (cap and mask, sterile gown, sterile gloves, large sterile sheet, hand hygiene and cutaneous antisepsis) and infiltrated locally with 1% Lidocaine.  Ultrasound demonstrated patency of the right basilic vein, and this was documented with an image.  Under real-time ultrasound guidance, this vein was accessed with a 21 gauge micropuncture needle and image documentation was performed.  The needle was exchanged over a guidewire for a peel-away sheath through which a 5 [place] dual lumen PICC trimmed to 36 cm was advanced, positioned with its tip at the lower SVC/right atrial junction.  Fluoroscopy during the procedure and fluoro spot  radiograph confirms appropriate catheter position.  The catheter was flushed, secured to the skin with Prolene sutures, and covered with a sterile dressing.  Complications:  None  IMPRESSION: Successful right arm PICC line placement with ultrasound and fluoroscopic guidance.  The catheter is ready for use.   Original Report Authenticated By: Reola Calkins, M.D.      [facility] Course:  For complete details please refer to admission H and P, but in brief, he is a 31 yo AA male with SS genotype, Sickle Cell Disease who on presentation complained of progressively worsening right-sided flank and thoracic back pain with associated with shortness of breath and admitted for further evaluation and treatment. Sickle Cell Crisis was determined based on a previously elevated Retic 19.4% on 11/13/11 and a T. Bili of 4.7 on admission. He was treated with gentle IV hydration, IV Dilaudid 4mg  IV [name] 2 hrs prn for severe breakthrough pain, long-acting pain medications (Oxycontin BID) in addition to anti-emetic, anti-pruretic and  bowel management regimen'[name] as needed.  His most recent Hgb electrophoresis was on 11/07/11 with Hgb A 23.4, Hgb F 5.5%, and Hgb [name] 67.7% and he was maintained on Hydrea and folic Acid daily. He was also given Lovenox for DVT prophylaxis. He was noted to have a T. Bili of 3.2 prior to discharge and evidence ongoing crisis resolution. Acute on Chronic Pain slowly improved with medication regimen noted above. Despite evidence of ongoing hemolysis, he felt that he could manage his pain at home and wanted to be discharged. He was given Rx refills for Dilaudid 4mg  tabs (#30,0R), and Oxycontin 80mg  (#30,0R). Chest Pain/DOE/history ACS was further evaluated with CXR on admission noting stable mild cardiomegaly and chronic lung disease, but negative for acute findings. He also underwent a CTA chest negative Pulmonary Emboli but identified scarring and atelectasis. He was encouraged to use an Incentive Spirometry [name] 1-2 hours while awake and his chest pain continued to resolve with improving respiratory status prior to discharge. Anemia of SCD was followed closely during this admission and felt suspect secondary active hemolysis. He was transfused 1 units PRBC'[name] on admission and with a Hgb of 6.8 the am of impending discharge. He was then transfused another unit of PRBC'[name] and had a repeat Hgb of 7.8 prior to discharge. Leukocytosis was identified on admission with a WBC of 25.6 and further evaluated with a negative CXR, blood, and urine Cx and felt secondary to inflammatory response with SCD. He was afebrile with a WBC of 14.2 at the time of discharge. Secondary Hemochromatosis was further evaluated and treated with a Ferritin level of 2095 on 11/04/11. 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 He remained on IV Desferal daily while inpatient and was given a Rx for Exjade 500mg  po daily at discharge (# 30, 2R). Hypokalemia was chronic but followed closely. He has been on KCl 20 meq po BID at home. 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 His potassium levels did not significantly improve and he was taken off aldactone and maintained on his previous dose po potassium supplement. He had a K level of 4.0 at the time of discharge. Insomnia was chronic and reportedly no longer improved with taking his home dose of Restoril 7.5mg . He reported doing better with Ambien 10mg  prn while inpatient. He denies ongoing insomnia with it'[name] use during this admission, and was given a Rx for ongoing use (30,  0R). HTN was identified with repeatedly elevated DBP 80-95 during this adm. He also had cardiomegaly noted on his adm CXR and a 15 yr history tobacco abuse. Due to these findings, the decision was made to start him on a low dose ACEI for cardiorenal protection. He was started on Lisinopril 10mg  po daily and a Rx given at the time of discharge (#30, 2R). He also underwent a renal ultrasound prior to discharge. Findings of this study indenified no stones, masses or hydronephrosis but incidental findings noted the left kidney 2.4 cm less than the right kidney and can be further evaluated in the outpatient setting. Since he has been placed on an ACEI for BP control, he will have follow up labs (CMET) in the outpatient clinic 1 wk. Mood Disorder was not problematic this admission and he remained stable on his home dose of Zyprexa. Vascular Access issues were identified as he has been frequently requiring PICC line placement with each subsequent admission. He has had an infected Port-A-Cath in the past, aproximately 2 yrs ago according to the patient. He has an appointment with Dr. Avel Peace on January 03, 2012 at 9:20 am. [facility] week for labs and will return to see  Dr [name] in follow up within 2 weeks of discharge. He currently denies any new complaints and is in no acute distress at the time of this dictation.   Physical Exam at  Discharge:  BP 115/69  Pulse 88  Temp 97.8 F (36.6 C) (Oral)  Resp 18  Ht 6' (1.829 m)  Wt 79.062 kg (174 lb 4.8 oz)  BMI 23.64 kg/m2  SpO2 97%  General Appearance: Alert and oriented, cooperative, well developed, well nourished, mild distress  Head: Normocephalic, without obvious abnormality, atraumatic  Eyes: PERRL, slight strabismus, mild icteric sclera  Nose: Nares normal, septum midline, mucosa normal, no drainage or sinus tenderness.  Throat: Normal lips, mucosa, tongue and gums  Back: Symmetric, mild CVA/flank tenderness, lower back tenderness  Resp: even and nonlabored, diminished bilateral bases, no vocal fremitus; no wheezes/rales/rhonchi  Cardio: S1, S2 regular, no murmurs/clicks/rubs/gallops  GI: Soft, ND, active bowel sounds, no masses, no organomegaly appreciated  Male Genitalia: Deferred, the patient denies priapism  Extremities: Extremities normal, atraumatic, no cyanosis or edema, negative Homans sign, no sign of DVT, no tenderness  Pulses: 2+ and symmetric  Skin: Skin color, texture, turgor normal, no rashes or lesions, numerous tattoos  Neurologic: Grossly normal, CN II-XII intact, no focal deficits  Psych: Appropriate affect   Disposition at Discharge: 01-Home or Self Care  Discharge Orders:     Discharge Orders    Future Appointments: Provider: Department: Dept Phone: Center:   01/03/2012 9:20 AM Adolph Pollack, MD [facility] [phone] None     Future Orders Please Complete By Expires   PICC line removal  11/29/11 11/30/11   Scheduling Instructions:   PICC line removal prior to discharge (following blood transfusion and f/u labs)   Discharge patient      Comments:   Discharge this evening after blood completed and repeat H/H resulted. Inform on call clinician if Hgb <7.5 and cancel discharge if Hgb remains <7.0      Condition at Discharge:   Stable  Time spent on Discharge:  Greater than 30 minutes.  SignedLyda Perone, Gerald Sara  [name] 11/29/2011, 10:30 AM

## 2011-11-29 NOTE — Progress Notes (Signed)
Patient discharged to home.  Reviewed discharge instructions and medications with patient.  No further questions at this time.  Patient received 1 unit of PRBC's today without any complications.  Hemoglobin at discharge 7.8.  PICC line removed by IV team.  Dressing to right upper arm intact, no bleeding, no hematoma.  Patient remained in bed for 1/2 hour post picc line removal.  Vital signs following picc line removal stable.  Patient refused mychart.  Patient escorted to lobby by nursing tech.  Patient discharged.

## 2011-11-30 LAB — TYPE AND SCREEN
ABO/RH(D): O POS
Antibody Screen: NEGATIVE

## 2011-12-02 IMAGING — CR DG SHOULDER 2+V*L*
2 series · 2 of 2 positions shown · non-contrast
Comparison: 10/11/2008.

CLINICAL DATA: Sickle cell crisis.  Rule out progression of AVN.

LEFT SHOULDER - 2+ VIEW

[view not recorded (1 of 2)]
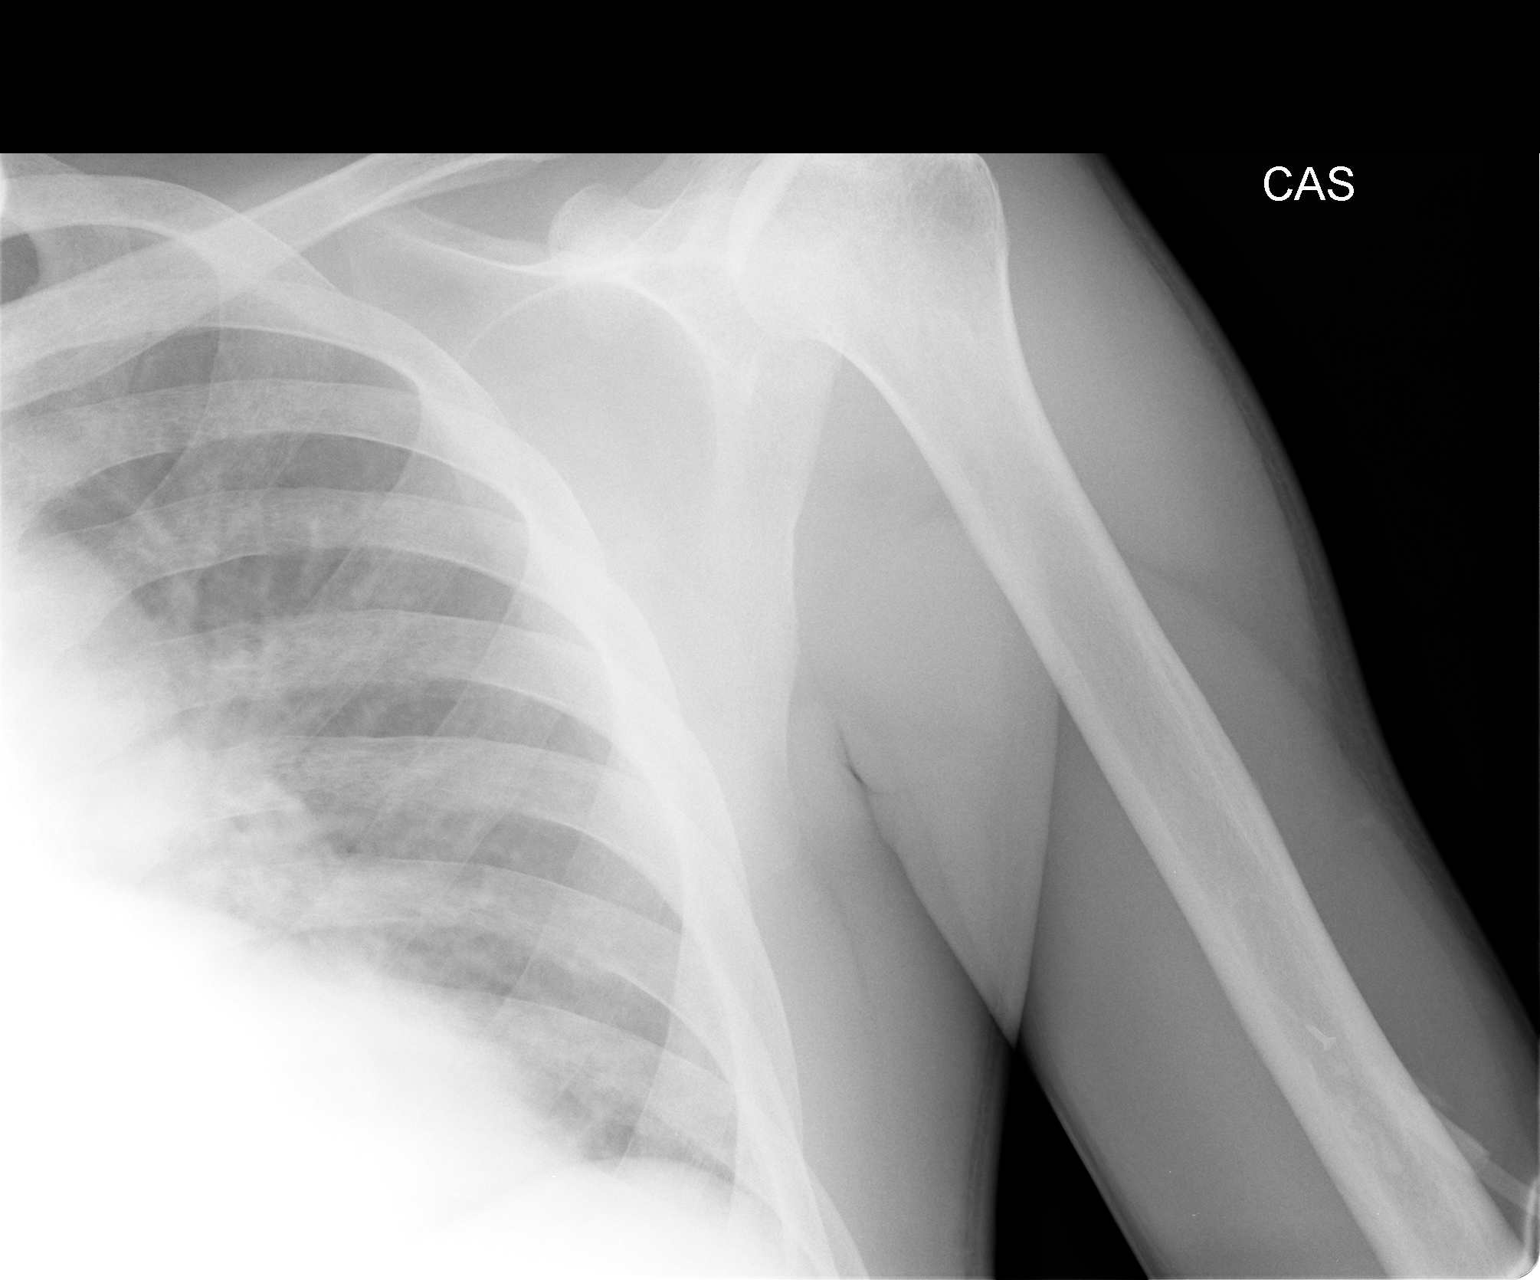

[view not recorded (2 of 2)]
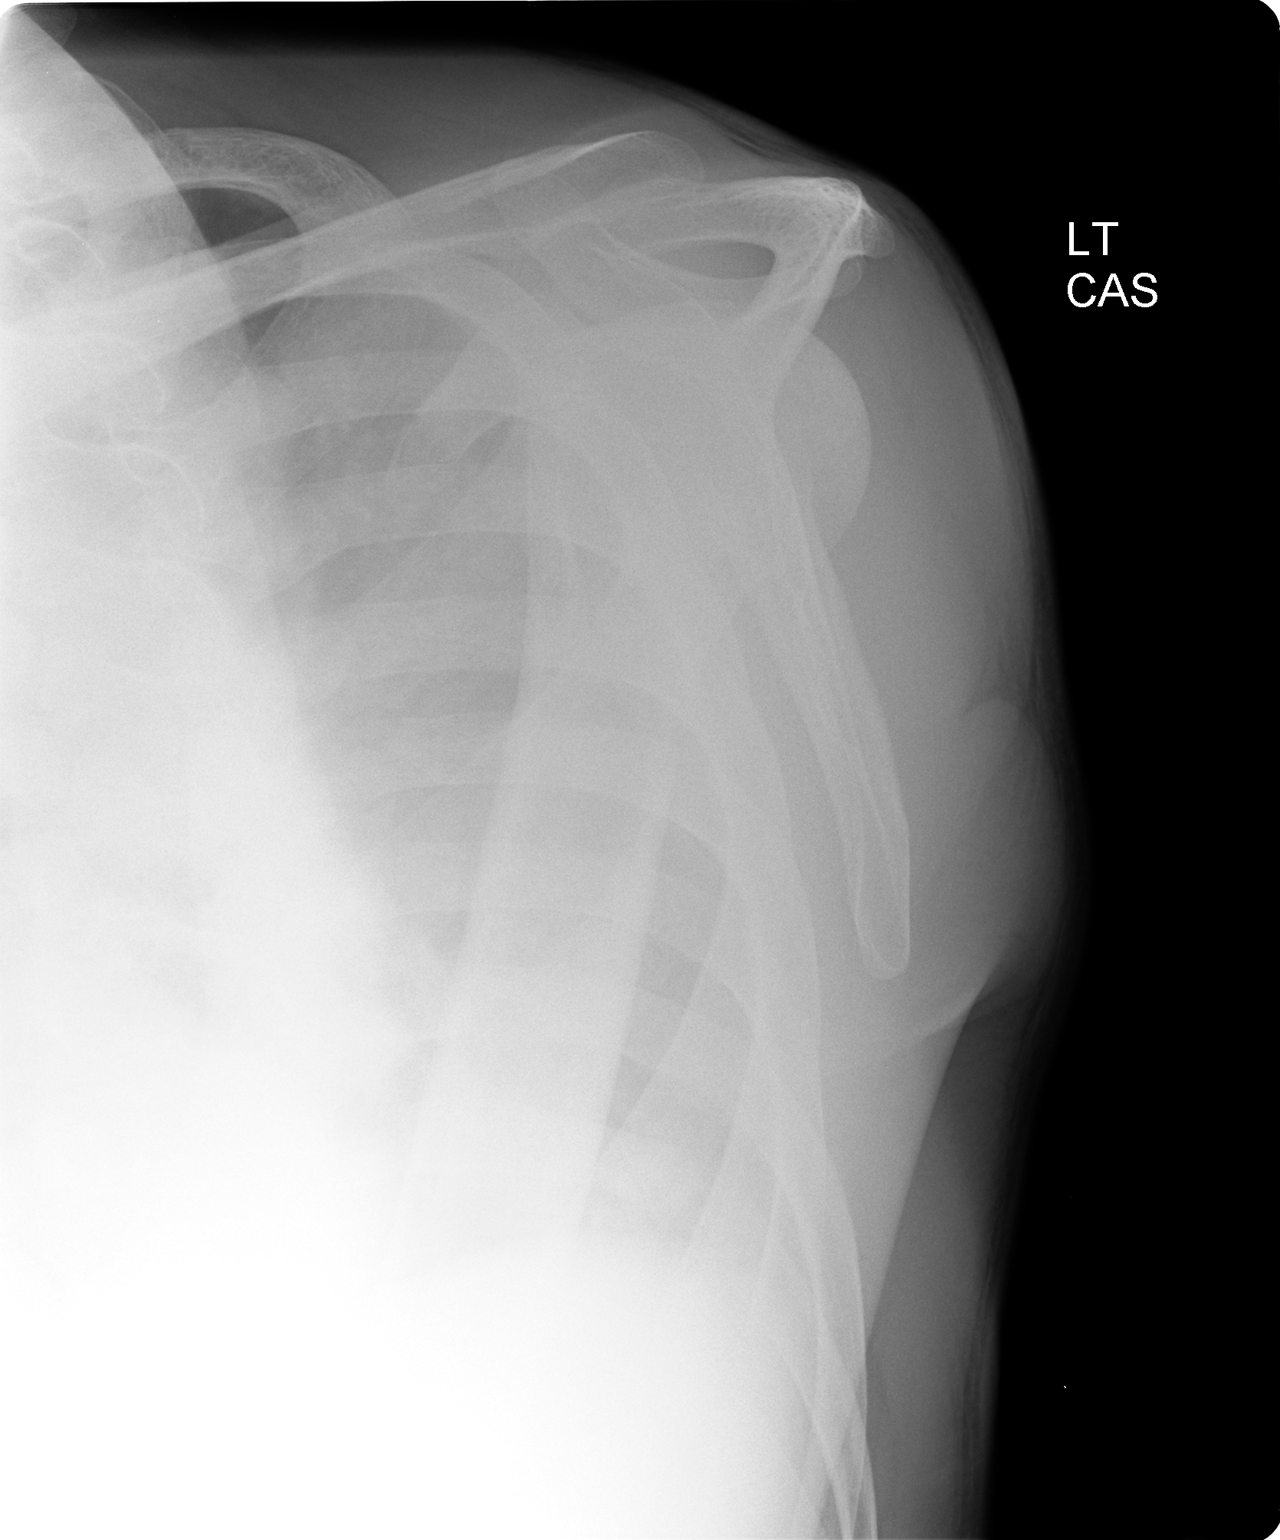

[2 of 2 positions shown; findings below may reference images not displayed]

FINDINGS: There is lucency and sclerosis in the humeral head
compatible with AVN.  No definite progression since the prior
study.  Direct comparison is difficult due to different
projections.  The alignment is normal and there is no fracture.
IMPRESSION: AVN of the humeral head appears similar to the prior study.

## 2011-12-07 IMAGING — CR DG CHEST 2V
2 series · 2 of 2 positions shown · non-contrast
Comparison: 01/04/2009

CLINICAL DATA: Sickle cell crisis.

CHEST - 2 VIEW

[w chest pa]
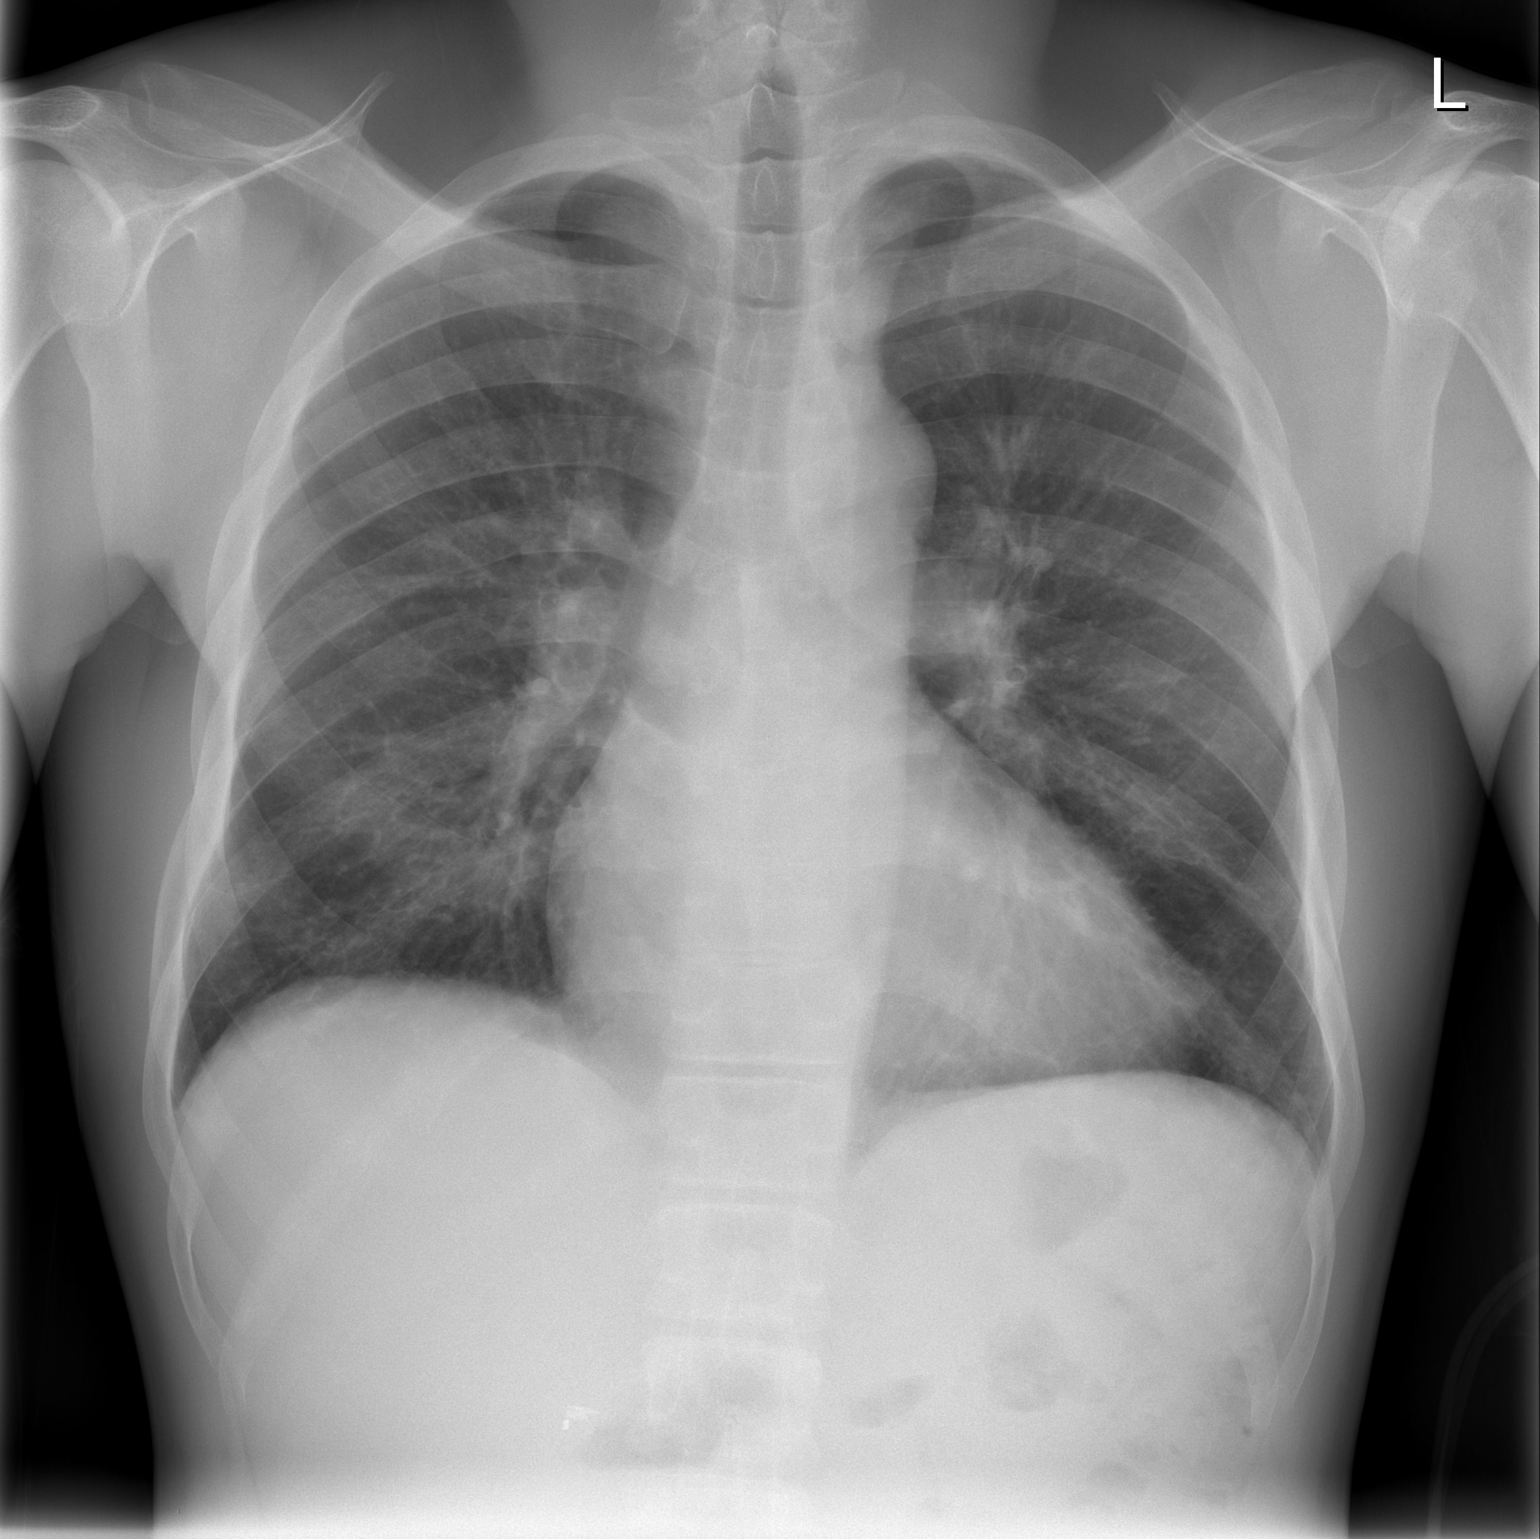

[w chest lat]
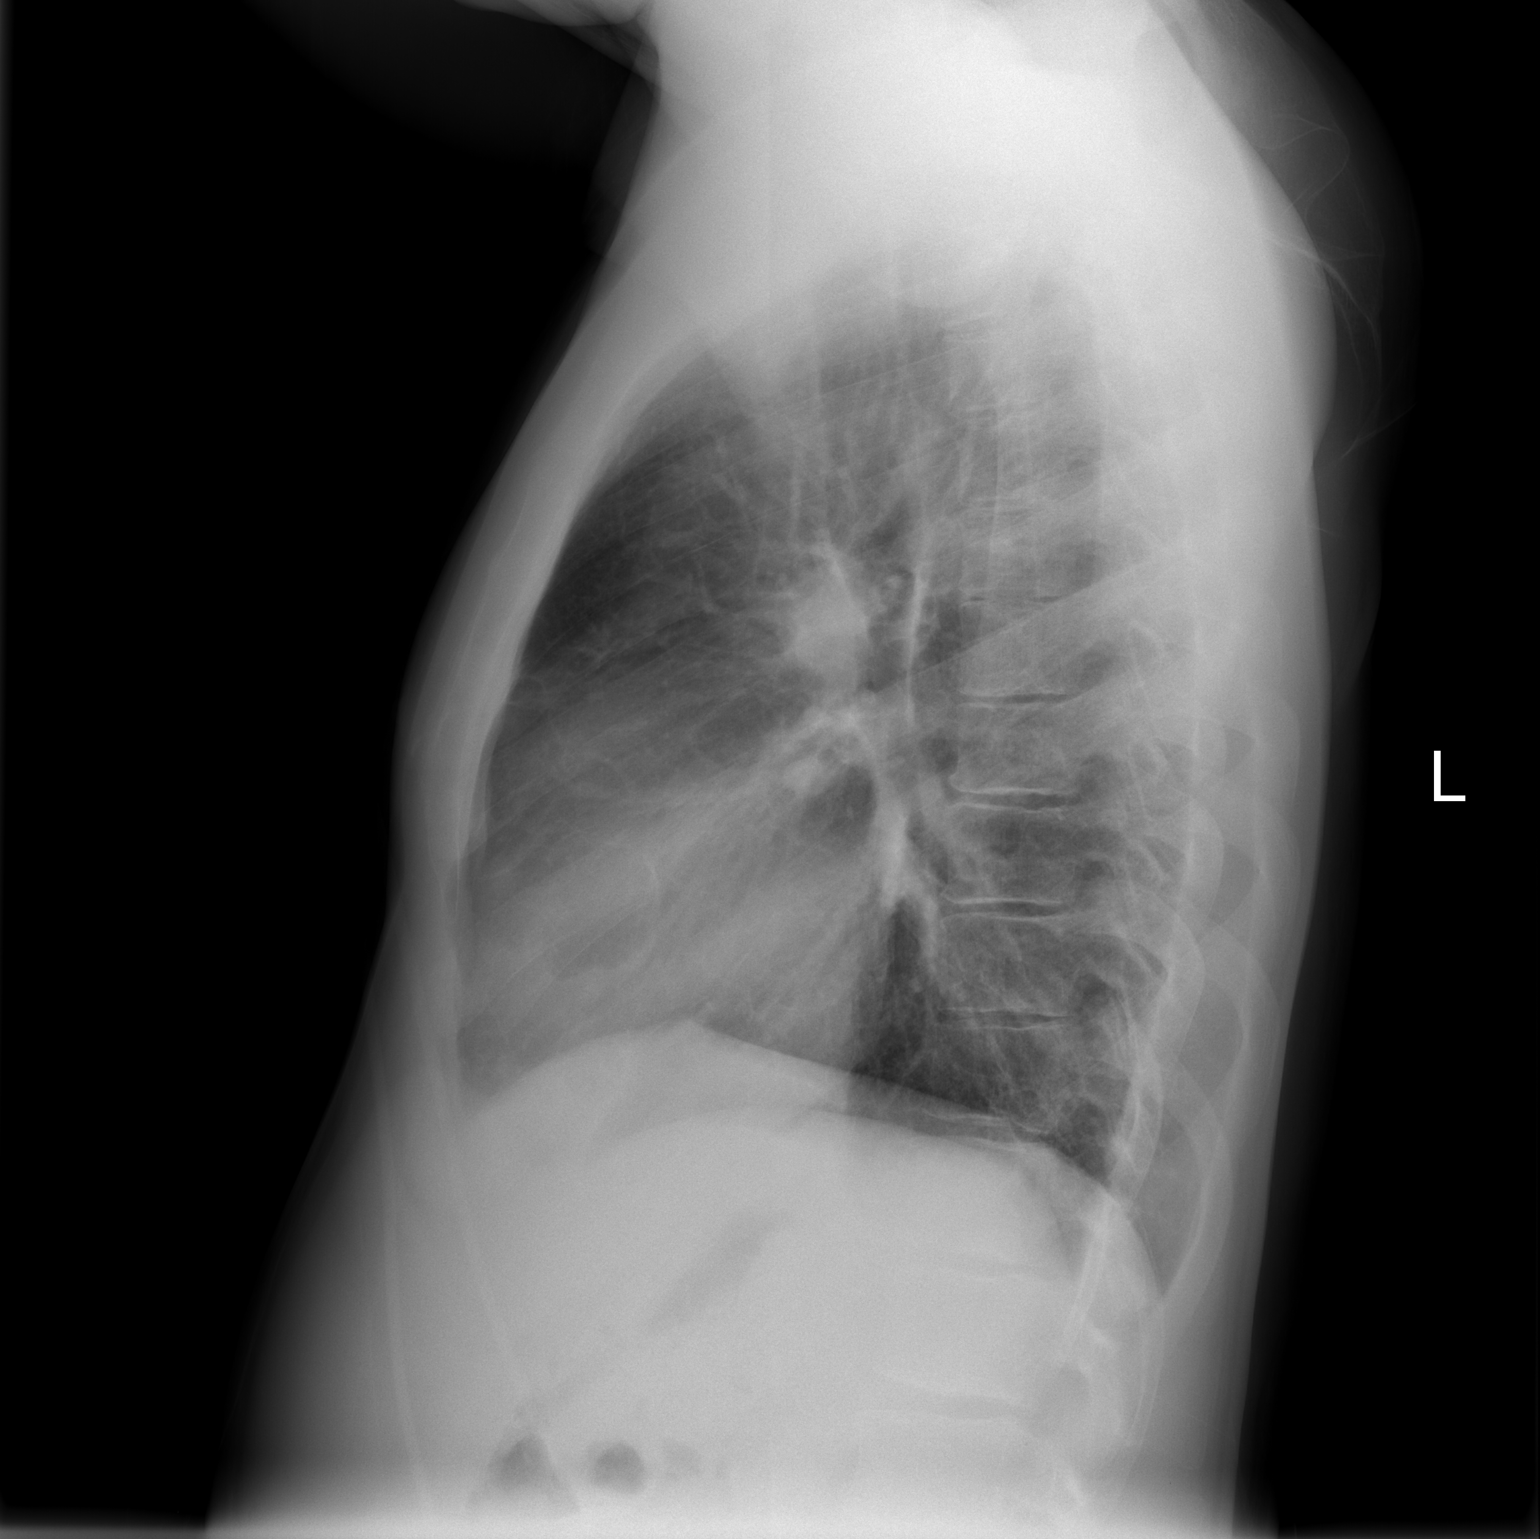

[2 of 2 positions shown; findings below may reference images not displayed]

FINDINGS: The heart is borderline enlarged but stable.  Persistent
vascular congestion and bronchitic type lung changes without
definite infiltrate, edema or effusion.  Bony thorax is intact.
IMPRESSION: Stable mild vascular congestion and bronchitic type lung changes
without definite infiltrate, edema or effusion.

## 2011-12-10 ENCOUNTER — Emergency Department (HOSPITAL_COMMUNITY)
Admission: EM | Admit: 2011-12-10 | Discharge: 2011-12-10 | Disposition: A | Discharge status: Home / Self Care | Payer: Medicare Other | Attending: Emergency Medicine | Admitting: Emergency Medicine

## 2011-12-10 ENCOUNTER — Encounter (HOSPITAL_COMMUNITY): Payer: Self-pay | Admitting: *Deleted

## 2011-12-10 ENCOUNTER — Inpatient Hospital Stay (HOSPITAL_COMMUNITY)
Admission: AD | Admit: 2011-12-10 | Discharge: 2011-12-19 | DRG: 812 | Disposition: A | Discharge status: Home / Self Care | Payer: Medicare Other | Source: Ambulatory Visit | Attending: Internal Medicine | Admitting: Internal Medicine

## 2011-12-10 ENCOUNTER — Telehealth (HOSPITAL_COMMUNITY): Payer: Self-pay | Admitting: General Practice

## 2011-12-10 ENCOUNTER — Emergency Department (HOSPITAL_COMMUNITY): Payer: Medicare Other

## 2011-12-10 DIAGNOSIS — D473 Essential (hemorrhagic) thrombocythemia: Secondary | ICD-10-CM | POA: Insufficient documentation

## 2011-12-10 DIAGNOSIS — D5700 Hb-SS disease with crisis, unspecified: Principal | ICD-10-CM

## 2011-12-10 DIAGNOSIS — M25559 Pain in unspecified hip: Secondary | ICD-10-CM | POA: Diagnosis present

## 2011-12-10 DIAGNOSIS — G4700 Insomnia, unspecified: Secondary | ICD-10-CM | POA: Diagnosis present

## 2011-12-10 DIAGNOSIS — R079 Chest pain, unspecified: Secondary | ICD-10-CM | POA: Diagnosis not present

## 2011-12-10 DIAGNOSIS — R17 Unspecified jaundice: Secondary | ICD-10-CM

## 2011-12-10 DIAGNOSIS — E876 Hypokalemia: Secondary | ICD-10-CM | POA: Diagnosis not present

## 2011-12-10 DIAGNOSIS — D72829 Elevated white blood cell count, unspecified: Secondary | ICD-10-CM

## 2011-12-10 DIAGNOSIS — R110 Nausea: Secondary | ICD-10-CM | POA: Insufficient documentation

## 2011-12-10 DIAGNOSIS — Z79899 Other long term (current) drug therapy: Secondary | ICD-10-CM | POA: Insufficient documentation

## 2011-12-10 DIAGNOSIS — Z86718 Personal history of other venous thrombosis and embolism: Secondary | ICD-10-CM | POA: Insufficient documentation

## 2011-12-10 DIAGNOSIS — Z96649 Presence of unspecified artificial hip joint: Secondary | ICD-10-CM | POA: Insufficient documentation

## 2011-12-10 DIAGNOSIS — J8410 Pulmonary fibrosis, unspecified: Secondary | ICD-10-CM | POA: Diagnosis not present

## 2011-12-10 DIAGNOSIS — D571 Sickle-cell disease without crisis: Secondary | ICD-10-CM

## 2011-12-10 DIAGNOSIS — Z8739 Personal history of other diseases of the musculoskeletal system and connective tissue: Secondary | ICD-10-CM | POA: Insufficient documentation

## 2011-12-10 DIAGNOSIS — F39 Unspecified mood [affective] disorder: Secondary | ICD-10-CM | POA: Insufficient documentation

## 2011-12-10 DIAGNOSIS — Z86711 Personal history of pulmonary embolism: Secondary | ICD-10-CM | POA: Insufficient documentation

## 2011-12-10 DIAGNOSIS — I10 Essential (primary) hypertension: Secondary | ICD-10-CM | POA: Diagnosis not present

## 2011-12-10 DIAGNOSIS — Z87891 Personal history of nicotine dependence: Secondary | ICD-10-CM | POA: Insufficient documentation

## 2011-12-10 DIAGNOSIS — R0602 Shortness of breath: Secondary | ICD-10-CM | POA: Diagnosis not present

## 2011-12-10 DIAGNOSIS — G8929 Other chronic pain: Secondary | ICD-10-CM

## 2011-12-10 LAB — CBC WITH DIFFERENTIAL/PLATELET
Basophils Absolute: 0.3 10*3/uL — ABNORMAL HIGH (ref 0.0–0.1)
Eosinophils Absolute: 0.3 10*3/uL (ref 0.0–0.7)
HCT: 23.5 % — ABNORMAL LOW (ref 39.0–52.0)
Lymphocytes Relative: 11 % — ABNORMAL LOW (ref 12–46)
MCH: 29.2 pg (ref 26.0–34.0)
MCHC: 33.6 g/dL (ref 30.0–36.0)
Monocytes Absolute: 3.3 10*3/uL — ABNORMAL HIGH (ref 0.1–1.0)
Neutro Abs: 20.7 10*3/uL — ABNORMAL HIGH (ref 1.7–7.7)
RDW: 20.1 % — ABNORMAL HIGH (ref 11.5–15.5)

## 2011-12-10 LAB — COMPREHENSIVE METABOLIC PANEL
ALT: 24 U/L (ref 0–53)
Albumin: 3.6 g/dL (ref 3.5–5.2)
Alkaline Phosphatase: 89 U/L (ref 39–117)
Chloride: 103 mEq/L (ref 96–112)
Potassium: 3.2 mEq/L — ABNORMAL LOW (ref 3.5–5.1)
Sodium: 137 mEq/L (ref 135–145)
Total Bilirubin: 3.1 mg/dL — ABNORMAL HIGH (ref 0.3–1.2)
Total Protein: 7.3 g/dL (ref 6.0–8.3)

## 2011-12-10 LAB — RETICULOCYTES
RBC.: 2.71 MIL/uL — ABNORMAL LOW (ref 4.22–5.81)
Retic Ct Pct: 11 % — ABNORMAL HIGH (ref 0.4–3.1)

## 2011-12-10 MED ORDER — ZOLPIDEM TARTRATE 10 MG PO TABS
10.0000 mg | ORAL_TABLET | Freq: Every evening | ORAL | Status: DC | PRN
  Administered 2011-12-10 – 2011-12-15 (×6): 10 mg via ORAL
  Filled 2011-12-10: qty 1
  Filled 2011-12-10: qty 2
  Filled 2011-12-10 (×4): qty 1

## 2011-12-10 MED ORDER — FOLIC ACID 1 MG PO TABS
1.0000 mg | ORAL_TABLET | Freq: Every day | ORAL | Status: DC
  Administered 2011-12-10 – 2011-12-19 (×10): 1 mg via ORAL
  Filled 2011-12-10 (×10): qty 1

## 2011-12-10 MED ORDER — MOXIFLOXACIN HCL IN NACL 400 MG/250ML IV SOLN
400.0000 mg | INTRAVENOUS | Status: DC
  Administered 2011-12-10 – 2011-12-11 (×2): 400 mg via INTRAVENOUS
  Filled 2011-12-10 (×3): qty 250

## 2011-12-10 MED ORDER — ONDANSETRON HCL 4 MG/2ML IJ SOLN
4.0000 mg | INTRAMUSCULAR | Status: DC | PRN
  Administered 2011-12-10 – 2011-12-19 (×34): 4 mg via INTRAVENOUS
  Filled 2011-12-10 (×34): qty 2

## 2011-12-10 MED ORDER — DIPHENHYDRAMINE HCL 50 MG/ML IJ SOLN
12.5000 mg | INTRAMUSCULAR | Status: DC | PRN
  Administered 2011-12-10 – 2011-12-11 (×4): 25 mg via INTRAVENOUS
  Administered 2011-12-11: 12.5 mg via INTRAVENOUS
  Administered 2011-12-11 (×4): 25 mg via INTRAVENOUS
  Administered 2011-12-12: 12.5 mg via INTRAVENOUS
  Administered 2011-12-12 – 2011-12-18 (×25): 25 mg via INTRAVENOUS
  Administered 2011-12-18: 12.5 mg via INTRAVENOUS
  Administered 2011-12-18 (×2): 25 mg via INTRAVENOUS
  Administered 2011-12-19: 12.5 mg via INTRAVENOUS
  Administered 2011-12-19: 25 mg via INTRAVENOUS
  Filled 2011-12-10 (×41): qty 1

## 2011-12-10 MED ORDER — HYDROMORPHONE HCL PF 1 MG/ML IJ SOLN
1.0000 mg | Freq: Once | INTRAMUSCULAR | Status: AC
  Administered 2011-12-10: 1 mg via INTRAMUSCULAR
  Filled 2011-12-10: qty 1

## 2011-12-10 MED ORDER — HYDROXYUREA 500 MG PO CAPS
500.0000 mg | ORAL_CAPSULE | Freq: Two times a day (BID) | ORAL | Status: DC
  Administered 2011-12-10 – 2011-12-19 (×19): 500 mg via ORAL
  Filled 2011-12-10 (×21): qty 1

## 2011-12-10 MED ORDER — LISINOPRIL 10 MG PO TABS
10.0000 mg | ORAL_TABLET | Freq: Every day | ORAL | Status: DC
  Administered 2011-12-10 – 2011-12-18 (×9): 10 mg via ORAL
  Filled 2011-12-10 (×11): qty 1

## 2011-12-10 MED ORDER — ONDANSETRON 8 MG PO TBDP
8.0000 mg | ORAL_TABLET | Freq: Once | ORAL | Status: AC
  Administered 2011-12-10: 8 mg via ORAL
  Filled 2011-12-10: qty 1

## 2011-12-10 MED ORDER — OXYCODONE HCL ER 80 MG PO T12A: 80.0000 mg | EXTENDED_RELEASE_TABLET | Freq: Two times a day (BID) | ORAL | Status: DC

## 2011-12-10 MED ORDER — HYDROMORPHONE HCL PF 2 MG/ML IJ SOLN: 2.0000 mg | Freq: Once | INTRAMUSCULAR | Status: DC

## 2011-12-10 MED ORDER — ONDANSETRON HCL 4 MG PO TABS: 4.0000 mg | ORAL_TABLET | ORAL | Status: DC | PRN

## 2011-12-10 MED ORDER — POTASSIUM CHLORIDE CRYS ER 20 MEQ PO TBCR: 20.0000 meq | EXTENDED_RELEASE_TABLET | Freq: Two times a day (BID) | ORAL | Status: DC

## 2011-12-10 MED ORDER — POTASSIUM CHLORIDE CRYS ER 20 MEQ PO TBCR
30.0000 meq | EXTENDED_RELEASE_TABLET | Freq: Three times a day (TID) | ORAL | Status: DC
  Administered 2011-12-10 – 2011-12-11 (×3): 30 meq via ORAL
  Filled 2011-12-10: qty 1
  Filled 2011-12-10 (×2): qty 2
  Filled 2011-12-10: qty 1

## 2011-12-10 MED ORDER — OXYCODONE HCL ER 40 MG PO T12A
80.0000 mg | EXTENDED_RELEASE_TABLET | Freq: Two times a day (BID) | ORAL | Status: DC
Start: 2011-12-10 — End: 2011-12-19
  Administered 2011-12-10 – 2011-12-19 (×18): 80 mg via ORAL
  Filled 2011-12-10 (×6): qty 2
  Filled 2011-12-10: qty 1
  Filled 2011-12-10: qty 2
  Filled 2011-12-10: qty 1
  Filled 2011-12-10 (×2): qty 2
  Filled 2011-12-10: qty 1
  Filled 2011-12-10: qty 2
  Filled 2011-12-10: qty 1
  Filled 2011-12-10: qty 2
  Filled 2011-12-10: qty 1
  Filled 2011-12-10 (×3): qty 2
  Filled 2011-12-10: qty 1

## 2011-12-10 MED ORDER — POTASSIUM CHLORIDE IN NACL 20-0.45 MEQ/L-% IV SOLN
INTRAVENOUS | Status: DC
  Administered 2011-12-10 – 2011-12-11 (×3): via INTRAVENOUS
  Filled 2011-12-10 (×7): qty 1000

## 2011-12-10 MED ORDER — DEXTROSE-NACL 5-0.45 % IV SOLN
INTRAVENOUS | Status: DC
  Administered 2011-12-10: 13:00:00 via INTRAVENOUS

## 2011-12-10 MED ORDER — HYDROMORPHONE HCL PF 2 MG/ML IJ SOLN
2.0000 mg | INTRAMUSCULAR | Status: DC | PRN
  Administered 2011-12-10 – 2011-12-11 (×7): 4 mg via INTRAVENOUS
  Administered 2011-12-11: 2 mg via INTRAVENOUS
  Administered 2011-12-11: 4 mg via INTRAVENOUS
  Administered 2011-12-11 (×3): 2 mg via INTRAVENOUS
  Administered 2011-12-11 – 2011-12-12 (×8): 4 mg via INTRAVENOUS
  Administered 2011-12-12: 2 mg via INTRAVENOUS
  Administered 2011-12-12 – 2011-12-16 (×32): 4 mg via INTRAVENOUS
  Filled 2011-12-10: qty 2
  Filled 2011-12-10 (×2): qty 1
  Filled 2011-12-10 (×26): qty 2
  Filled 2011-12-10: qty 1
  Filled 2011-12-10 (×7): qty 2
  Filled 2011-12-10 (×2): qty 1
  Filled 2011-12-10 (×15): qty 2
  Filled 2011-12-10: qty 1

## 2011-12-10 MED ORDER — HYDROXYUREA 500 MG PO CAPS: 500.0000 mg | ORAL_CAPSULE | Freq: Two times a day (BID) | ORAL | Status: DC

## 2011-12-10 MED ORDER — DIPHENHYDRAMINE HCL 25 MG PO CAPS: 25.0000 mg | ORAL_CAPSULE | ORAL | Status: DC | PRN

## 2011-12-10 MED ORDER — OLANZAPINE 10 MG PO TABS
10.0000 mg | ORAL_TABLET | Freq: Every day | ORAL | Status: DC
Start: 2011-12-10 — End: 2011-12-19
  Administered 2011-12-10 – 2011-12-18 (×9): 10 mg via ORAL
  Filled 2011-12-10 (×11): qty 1

## 2011-12-10 MED ORDER — HYDROMORPHONE HCL PF 2 MG/ML IJ SOLN
2.0000 mg | Freq: Once | INTRAMUSCULAR | Status: AC
  Administered 2011-12-10: 2 mg via INTRAVENOUS
  Filled 2011-12-10: qty 1

## 2011-12-10 MED ORDER — OXYCODONE HCL 80 MG PO TB12: 80.0000 mg | ORAL_TABLET | Freq: Two times a day (BID) | ORAL | Status: DC

## 2011-12-10 NOTE — H&P (Signed)
[facility]  Patient ID: [name], male   DOB: Jul 20, 1980, 31 y.o.   MRN: [id number]  Chief Complaint  Patient presents with  . Sickle Cell Pain Crisis    HPI [name] is a 31 y.o. 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 He noted problems with shortness of breath with activity early in the morning. This was associated with transient wheezing. He had some blurred vision as well. This morning he woke up with left-sided aching pain. No pleuritic. There was no associated increased shortness of breath. He's had some sinus congestion with postnasal drainage. Pain was severe enough to come to emerge from for further evaluation. Chest x-ray was obtained which was unremarkable. He is now having pain in his left hip as well. He states prior to this he was doing well. He was occasionally around smoke. He notes that his brother was sick recently with a cough as well.   Past Medical History  Diagnosis Date  . Sickle cell anemia   . Blood transfusion   . 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 Hypokalemia   . Mood disorder   . Pulmonary embolism   . Avascular necrosis   . Leukocytosis     Chronic  . Thrombocytosis     Chronic    Past Surgical History  Procedure Date  . Right hip replacement     08/2006  . Cholecystectomy     01/2008  . Porta cath placement   . Porta cath removal   . Umbilical hernia repair     01/2008  . Excision of left periauricular cyst     10/2009  . Excision of right ear lobe cyst with primary closur     11/2007    Family History  Problem Relation Age of Onset  . Sickle cell anemia Mother   . Sickle cell anemia Father   . Sickle cell trait Brother     Social History History  Substance Use Topics  . Smoking status: Former Smoker -- 13 years    Types: Cigarettes    Quit date: 07/08/2010  . Smokeless tobacco: Never Used  . Alcohol Use: No    Allergies    Allergen Reactions  . Morphine And Related Hives and Rash    Pt states he is able to tolerate Dilaudid with no reactions.    Current Facility-Administered Medications  Medication Dose Route Frequency Provider Last Rate Last Dose  . dextrose 5 %-0.45 % sodium chloride infusion   Intravenous Continuous Gwenyth Bender, MD 125 mL/hr at 12/10/11 1246    . diphenhydrAMINE (BENADRYL) capsule 25-50 mg  25-50 mg Oral Q4H PRN Gwenyth Bender, MD       Or  . diphenhydrAMINE (BENADRYL) injection 12.5-25 mg  12.5-25 mg Intravenous Q4H PRN Gwenyth Bender, MD   25 mg at 12/10/11 1333  . folic acid (FOLVITE) tablet 1 mg  1 mg Oral Daily Gwenyth Bender, MD      . HYDROmorphone (DILAUDID) injection 2-4 mg  2-4 mg Intravenous Q2H PRN Gwenyth Bender, MD   4 mg at 12/10/11 1333  . ondansetron (ZOFRAN) tablet 4 mg  4 mg Oral Q4H PRN Gwenyth Bender, MD       Or  . ondansetron [facility]) injection 4 mg  4 mg Intravenous Q4H PRN Gwenyth Bender, MD   4 mg at 12/10/11 1334   Facility-Administered Medications Ordered  in Other Encounters  Medication Dose Route Frequency Provider Last Rate Last Dose  . [COMPLETED] HYDROmorphone (DILAUDID) injection 1 mg  1 mg Intramuscular Once Lyanne Co, MD   1 mg at 12/10/11 0915  . [COMPLETED] HYDROmorphone (DILAUDID) injection 2 mg  2 mg Intravenous Once Lyanne Co, MD   2 mg at 12/10/11 1131  . [COMPLETED] ondansetron (ZOFRAN-ODT) disintegrating tablet 8 mg  8 mg Oral Once Lyanne Co, MD   8 mg at 12/10/11 0913  . [DISCONTINUED] HYDROmorphone (DILAUDID) injection 2 mg  2 mg Intravenous Once Lyanne Co, MD        Review of Systems As noted above. He also remarks that 2 days ago he had a transient throbbing headache. This was associated with transient blurred vision. No nausea vomiting. Subsided completely.  Blood pressure 123/70, pulse 91, temperature 98.2 F (36.8 C), temperature source Oral, resp. rate 20, height 6' (1.829 m), weight 170 lb (77.111 kg), SpO2 94.00%.  Physical  Exam Well-developed well-nourished black male present in no acute distress. HEENT: Minimal left frontal sinus tenderness. Mild sclera icterus. TMs are clear. Posterior pharynx clear. NECK: No enlarged thyroid. No posterior cervical nodes. LUNGS: Clear to auscultation. No CVA tenderness. No vocal fremitus. No wheezes presently. CV: Normal S1, S2 without S3. ABDOMEN: No masses or tenderness. MSK: Left flank tenderness. Tenderness in the left hip and lateral thigh. No knee tenderness cycle tenderness. Negative Homans. No edema. NEURO: Intact.  Data Reviewed  Results for orders placed during the hospital encounter of 12/10/11 (from the past 48 hour(s))  RETICULOCYTES     Status: Abnormal   Collection Time   12/10/11  1:25 PM      Component Value Range Comment   Retic Ct Pct 11.0 (*) 0.4 - 3.1 %    RBC. 2.71 (*) 4.22 - 5.81 MIL/uL    Retic Count, Manual 298.1 (*) 19.0 - 186.0 K/uL    Remaining laboratory data pending. chest x-ray demonstrated mild increased perihilar bronchial markings.   Assessment    Sickle cell crisis. Left-sided chest pain. Noncardiac. No pleuritic. History of sickle cell lung disease. Rule out early asthmatic bronchitis. Patient described transient bronchospasm as well.  Transient headache. Rule out migraine. History of pneumonia and acute chest syndrome. History of tobacco abuse. Continues to abstain. History of hypokalemia.  Avascular necrosis. History of internal jugular vein thrombosis. Poor venous access. Patient will need to be reevaluated for a Port-A-Cath in the near future.      Plan    IV fluids with hypotonic saline and potassium replacement. IV Dilaudid for acute pain control. Incentive spirometry, anti-pruritic and antiemetics agents as necessary. Albuterol inhaler via nebulizer 4 times a day.  Continue home medication as well. Followup CMET, CBC with differential. Baseline CRP in a.m.        Delta Deshmukh 12/10/2011, 1:58 PM

## 2011-12-10 NOTE — Progress Notes (Signed)
Patient ID: [name], male   DOB: 02-Dec-1980, 31 y.o.   MRN: [id number] Orders given concerning high WBC and low Kt..per Dr. [name]

## 2011-12-10 NOTE — Progress Notes (Signed)
Verified with Dr. [name] to continue Klor-Con order of 30 mEq 3x a day and discontinue Klor-Con 20 mEq 2x a day.

## 2011-12-10 NOTE — Progress Notes (Signed)
Patient ID: [name], male   DOB: December 11, 1980, 31 y.o.   MRN: [id number] Notified Dr.Dean concerning Naol Esh WBC, 27.6,, waiting on orders.

## 2011-12-10 NOTE — ED Provider Notes (Signed)
History     CSN: [id number]  Arrival date & time 12/10/11  1478   First MD Initiated Contact with Patient 12/10/11 [phone]       chief complaint: Left chest and hip pain/sickle cell pain crisis   The history is provided by the patient.   patient reports worsening pain in his left chest and left hip over the past 24-48 hours.  He has tried his home pain medications which include OxyContin Dilaudid without improvement in his symptoms.  He denies fevers or chills.  He has no shortness of breath.  He denies cough or congestion.  He has no weakness of his legs.  He denies abdominal pain but does report he had some nausea this morning.  His had no vomiting.  Denies diarrhea.  His pain is moderate to severe at this time.  Nothing worsens or improves his symptoms.  His pain is not improved by his home pain medications.  Past Medical History  Diagnosis Date  . Sickle cell anemia   . Blood transfusion   . 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 Hypokalemia   . Mood disorder   . Pulmonary embolism   . Avascular necrosis   . Leukocytosis     Chronic  . Thrombocytosis     Chronic    Past Surgical History  Procedure Date  . Right hip replacement     08/2006  . Cholecystectomy     01/2008  . Porta cath placement   . Porta cath removal   . Umbilical hernia repair     01/2008  . Excision of left periauricular cyst     10/2009  . Excision of right ear lobe cyst with primary closur     11/2007    Family History  Problem Relation Age of Onset  . Sickle cell anemia Mother   . Sickle cell anemia Father   . Sickle cell trait Brother     History  Substance Use Topics  . Smoking status: Former Smoker -- 13 years    Types: Cigarettes    Quit date: 07/08/2010  . Smokeless tobacco: Never Used  . Alcohol Use: No      Review of Systems  All other systems reviewed and are negative.    Allergies  Morphine and related  Home Medications   Current Outpatient  Rx  Name  Route  Sig  Dispense  Refill  . DEFERASIROX 500 MG PO TBSO   Oral   Take 1 tablet (500 mg total) by mouth daily before breakfast.   30 tablet   2   . FOLIC ACID 1 MG PO TABS   Oral   Take 1 mg by mouth daily.         Marland Kitchen HYDROMORPHONE HCL 4 MG PO TABS   Oral   Take 1 tablet (4 mg total) by mouth every 4 (four) hours as needed for pain. For pain.   30 tablet   0   . HYDROXYUREA 500 MG PO CAPS   Oral   Take 500 mg by mouth 2 (two) times daily. May take with food to minimize GI side effects.         Marland Kitchen LISINOPRIL 10 MG PO TABS   Oral   Take 1 tablet (10 mg total) by mouth daily at 10 pm.   30 tablet   2   . OLANZAPINE 10 MG PO TABS   Oral   Take 10 mg by mouth  at bedtime.         . OXYCODONE HCL ER 80 MG PO TB12   Oral   Take 1 tablet (80 mg total) by mouth every 12 (twelve) hours.   30 tablet   0   . POTASSIUM CHLORIDE CRYS ER 20 MEQ PO TBCR   Oral   Take 1 tablet (20 mEq total) by mouth 2 (two) times daily.   60 tablet   1   . ZOLPIDEM TARTRATE 10 MG PO TABS   Oral   Take 1 tablet (10 mg total) by mouth at bedtime as needed for sleep.   30 tablet   0     BP 123/76  Pulse 100  Temp 97.9 F (36.6 C) (Oral)  Resp 19  SpO2 96%  Physical Exam  Nursing note and vitals reviewed. Constitutional: He is oriented to person, place, and time. He appears well-developed and well-nourished.  HENT:  Head: Normocephalic and atraumatic.  Eyes: EOM are normal.  Neck: Normal range of motion.  Cardiovascular: Normal rate, regular rhythm, normal heart sounds and intact distal pulses.   Pulmonary/Chest: Effort normal and breath sounds normal. No respiratory distress. He exhibits no tenderness.  Abdominal: Soft. He exhibits no distension. There is no tenderness.  Musculoskeletal: Normal range of motion.  Neurological: He is alert and oriented to person, place, and time.  Skin: Skin is warm and dry.  Psychiatric: He has a normal mood and affect. Judgment  normal.    ED Course  Procedures (including critical care time)  Labs Reviewed - No data to display Dg Chest 2 View  12/10/2011  *RADIOLOGY REPORT*  Clinical Data: Shortness of breath  CHEST - 2 VIEW  Comparison: 11/21/2011  Findings: Cardiomediastinal silhouette is stable.  Again noted perihilar mild increase bronchial markings.  Stable bilateral mild interstitial prominence.  No segmental infiltrate or pulmonary edema.  Bony thorax is stable.  IMPRESSION:  Again noted perihilar mild increase bronchial markings.  Stable bilateral mild interstitial prominence.  No segmental infiltrate or pulmonary edema.  Bony thorax is stable.   Original Report Authenticated By: Natasha Mead, M.D.    I personally reviewed the imaging tests through PACS system I reviewed available ER/hospitalization records through the EMR    1. Sickle cell crisis       MDM  discharge sickle cell clinic.  The patient is stable.  No signs of acute chest.  Vital signs are normal.  The patient be transported with his IV        Lyanne Co, MD 12/10/11 1218

## 2011-12-10 NOTE — ED Notes (Signed)
Patient states he woke up this morning with pain in his left side and left hip rating pain 8/10. Patient also states he has been short of breath for the past 2 days. Called sickle cell clinic and was advised to come to ED and be evaluated.

## 2011-12-10 NOTE — Progress Notes (Signed)
Received report from [employer]. Concerning patient [name], [name], vitals stable, patient comfortable, pain medications given, iv in right foot. Patient will come to the sickle cell.

## 2011-12-10 NOTE — Progress Notes (Signed)
Notified Dr. Patria Mane that [name] can come to the clinic per Dr. [name]. Requested that patient receive a assess due to hard stick. Spoke with his nurse Selena Batten, she will inform IV team.

## 2011-12-11 ENCOUNTER — Encounter (HOSPITAL_COMMUNITY): Payer: Self-pay | Admitting: *Deleted

## 2011-12-11 DIAGNOSIS — D5700 Hb-SS disease with crisis, unspecified: Secondary | ICD-10-CM | POA: Diagnosis not present

## 2011-12-11 DIAGNOSIS — D571 Sickle-cell disease without crisis: Secondary | ICD-10-CM | POA: Diagnosis not present

## 2011-12-11 DIAGNOSIS — E876 Hypokalemia: Secondary | ICD-10-CM | POA: Diagnosis present

## 2011-12-11 DIAGNOSIS — M79609 Pain in unspecified limb: Secondary | ICD-10-CM | POA: Diagnosis not present

## 2011-12-11 DIAGNOSIS — R079 Chest pain, unspecified: Secondary | ICD-10-CM | POA: Diagnosis present

## 2011-12-11 DIAGNOSIS — G8929 Other chronic pain: Secondary | ICD-10-CM | POA: Diagnosis not present

## 2011-12-11 DIAGNOSIS — Z452 Encounter for adjustment and management of vascular access device: Secondary | ICD-10-CM | POA: Diagnosis not present

## 2011-12-11 DIAGNOSIS — G4700 Insomnia, unspecified: Secondary | ICD-10-CM | POA: Diagnosis present

## 2011-12-11 DIAGNOSIS — F39 Unspecified mood [affective] disorder: Secondary | ICD-10-CM | POA: Diagnosis present

## 2011-12-11 DIAGNOSIS — I10 Essential (primary) hypertension: Secondary | ICD-10-CM | POA: Diagnosis present

## 2011-12-11 DIAGNOSIS — M25559 Pain in unspecified hip: Secondary | ICD-10-CM | POA: Diagnosis present

## 2011-12-11 DIAGNOSIS — R0602 Shortness of breath: Secondary | ICD-10-CM | POA: Diagnosis not present

## 2011-12-11 DIAGNOSIS — Z96649 Presence of unspecified artificial hip joint: Secondary | ICD-10-CM | POA: Diagnosis not present

## 2011-12-11 DIAGNOSIS — D72829 Elevated white blood cell count, unspecified: Secondary | ICD-10-CM | POA: Diagnosis present

## 2011-12-11 LAB — COMPREHENSIVE METABOLIC PANEL
Albumin: 3.6 g/dL (ref 3.5–5.2)
Albumin: 3.4 g/dL — ABNORMAL LOW (ref 3.5–5.2)
Alkaline Phosphatase: 96 U/L (ref 39–117)
Alkaline Phosphatase: 93 U/L (ref 39–117)
BUN: 3 mg/dL — ABNORMAL LOW (ref 6–23)
BUN: <3 mg/dL — ABNORMAL LOW (ref 6–23)
CO2: 23 mEq/L (ref 19–32)
Calcium: 8.5 mg/dL (ref 8.4–10.5)
Chloride: 105 mEq/L (ref 96–112)
Creatinine, Ser: 0.61 mg/dL (ref 0.50–1.35)
Creatinine, Ser: 0.59 mg/dL (ref 0.50–1.35)
GFR calc Af Amer: >90 mL/min (ref >90)
GFR calc non Af Amer: >90 mL/min (ref >90)
Glucose, Bld: 97 mg/dL (ref 70–99)
Potassium: 3 mEq/L — ABNORMAL LOW (ref 3.5–5.1)
Potassium: 3.3 mEq/L — ABNORMAL LOW (ref 3.5–5.1)
Total Bilirubin: 2.8 mg/dL — ABNORMAL HIGH (ref 0.3–1.2)
Total Protein: 7.2 g/dL (ref 6.0–8.3)

## 2011-12-11 LAB — CBC WITH DIFFERENTIAL/PLATELET
Basophils Absolute: 0.3 10*3/uL — ABNORMAL HIGH (ref 0.0–0.1)
Basophils Relative: 1 % (ref 0–1)
Eosinophils Relative: 4 % (ref 0–5)
HCT: 23.4 % — ABNORMAL LOW (ref 39.0–52.0)
Hemoglobin: 7.9 g/dL — ABNORMAL LOW (ref 13.0–17.0)
Lymphocytes Relative: 16 % (ref 12–46)
Lymphs Abs: 4.3 10*3/uL — ABNORMAL HIGH (ref 0.7–4.0)
MCV: 85.1 fL (ref 78.0–100.0)
Monocytes Relative: 14 % — ABNORMAL HIGH (ref 3–12)
Neutro Abs: 17.4 10*3/uL — ABNORMAL HIGH (ref 1.7–7.7)
RBC: 2.75 MIL/uL — ABNORMAL LOW (ref 4.22–5.81)
WBC: 26.9 10*3/uL — ABNORMAL HIGH (ref 4.0–10.5)

## 2011-12-11 MED ORDER — POTASSIUM CHLORIDE CRYS ER 20 MEQ PO TBCR
40.0000 meq | EXTENDED_RELEASE_TABLET | Freq: Three times a day (TID) | ORAL | Status: DC
  Administered 2011-12-11 – 2011-12-12 (×3): 40 meq via ORAL
  Filled 2011-12-11 (×5): qty 2

## 2011-12-11 NOTE — Progress Notes (Signed)
Report called to RN on [facility]; all questions answered

## 2011-12-11 NOTE — Progress Notes (Signed)
Pt. Arrived from [facility] for admission. Alert and oriented x 4. No resp. Distress noted. Continue with admission process.

## 2011-12-12 LAB — CBC WITH DIFFERENTIAL/PLATELET
Basophils Relative: 0 % (ref 0–1)
Eosinophils Relative: 4 % (ref 0–5)
HCT: 24.7 % — ABNORMAL LOW (ref 39.0–52.0)
Hemoglobin: 8.5 g/dL — ABNORMAL LOW (ref 13.0–17.0)
Lymphocytes Relative: 13 % (ref 12–46)
Lymphs Abs: 3.5 10*3/uL (ref 0.7–4.0)
MCV: 87 fL (ref 78.0–100.0)
Monocytes Relative: 12 % (ref 3–12)
Neutro Abs: 19.2 10*3/uL — ABNORMAL HIGH (ref 1.7–7.7)
WBC: 27.1 10*3/uL — ABNORMAL HIGH (ref 4.0–10.5)

## 2011-12-12 LAB — COMPREHENSIVE METABOLIC PANEL
ALT: 23 U/L (ref 0–53)
Albumin: 3.7 g/dL (ref 3.5–5.2)
BUN: 3 mg/dL — ABNORMAL LOW (ref 6–23)
Calcium: 8.6 mg/dL (ref 8.4–10.5)
GFR calc Af Amer: >90 mL/min (ref >90)
Glucose, Bld: 102 mg/dL — ABNORMAL HIGH (ref 70–99)
Potassium: 4.3 mEq/L (ref 3.5–5.1)
Sodium: 136 mEq/L (ref 135–145)
Total Protein: 7.6 g/dL (ref 6.0–8.3)

## 2011-12-12 MED ORDER — POTASSIUM CHLORIDE CRYS ER 20 MEQ PO TBCR
40.0000 meq | EXTENDED_RELEASE_TABLET | Freq: Two times a day (BID) | ORAL | Status: DC
  Administered 2011-12-13 – 2011-12-15 (×4): 40 meq via ORAL
  Filled 2011-12-12 (×7): qty 2

## 2011-12-12 MED ORDER — ALBUTEROL SULFATE HFA 108 (90 BASE) MCG/ACT IN AERS
2.0000 | INHALATION_SPRAY | RESPIRATORY_TRACT | Status: DC | PRN
  Filled 2011-12-12: qty 6.7

## 2011-12-12 MED ORDER — HYDROMORPHONE HCL 4 MG PO TABS
4.0000 mg | ORAL_TABLET | ORAL | Status: DC | PRN
  Administered 2011-12-12: 4 mg via ORAL
  Filled 2011-12-12: qty 1

## 2011-12-12 MED ORDER — MOXIFLOXACIN HCL 400 MG PO TABS
400.0000 mg | ORAL_TABLET | ORAL | Status: AC
  Administered 2011-12-12 – 2011-12-18 (×6): 400 mg via ORAL
  Filled 2011-12-12 (×8): qty 1

## 2011-12-12 MED ORDER — MOXIFLOXACIN HCL IN NACL 400 MG/250ML IV SOLN
400.0000 mg | INTRAVENOUS | Status: DC
  Filled 2011-12-12 (×3): qty 250

## 2011-12-12 MED ORDER — ENOXAPARIN SODIUM 40 MG/0.4ML ~~LOC~~ SOLN
40.0000 mg | Freq: Every day | SUBCUTANEOUS | Status: DC
  Administered 2011-12-12 – 2011-12-19 (×8): 40 mg via SUBCUTANEOUS
  Filled 2011-12-12 (×8): qty 0.4

## 2011-12-12 MED ORDER — SODIUM CHLORIDE 0.45 % IV SOLN
INTRAVENOUS | Status: DC
  Administered 2011-12-12 – 2011-12-14 (×3): via INTRAVENOUS
  Administered 2011-12-14 – 2011-12-15 (×2): 1000 mL via INTRAVENOUS
  Administered 2011-12-15 – 2011-12-16 (×2): via INTRAVENOUS
  Administered 2011-12-16 – 2011-12-17 (×3): 100 mL/h via INTRAVENOUS
  Administered 2011-12-17 – 2011-12-19 (×5): via INTRAVENOUS

## 2011-12-12 MED ORDER — HYDROMORPHONE HCL PF 2 MG/ML IJ SOLN
4.0000 mg | INTRAMUSCULAR | Status: DC | PRN
  Administered 2011-12-12 – 2011-12-13 (×5): 4 mg via INTRAMUSCULAR
  Filled 2011-12-12 (×6): qty 2

## 2011-12-12 MED ORDER — DEFEROXAMINE MESYLATE 2 G IJ SOLR
2000.0000 mg | Freq: Every day | INTRAMUSCULAR | Status: DC
  Administered 2011-12-13 – 2011-12-18 (×6): 2000 mg via INTRAVENOUS
  Filled 2011-12-12 (×8): qty 2

## 2011-12-12 NOTE — Progress Notes (Signed)
Pharmacy: VTE prophylaxis   Lovenox 40 mg SQ Q24h started for VTE px per MD request.  Scr stable and WNL, Weight 78 kg  Will sign off, please re consult if needed  Thanks Clydene Fake PharmD Pager #: [phone] 1:27 PM 12/12/2011

## 2011-12-12 NOTE — H&P (Signed)
[name] is an 31 y.o. male.   Chief Complaint: left-sided chest pain for sickle cell crisis.  HPI: This is one of several: Health admissions for this 31 year old black male with hemoglobin SS disease who is being transferred from the sickle cell unit for further therapy. Patient presented 24 ours ago with increasing limb pain and left-sided aching chest pain. He had suddenly awakened with this after having done well for several days. Patient was treated in the day hospital with IV fluids, IV Dilaudid, incentive spirometry. He was maintained on supplemental potassium as well. Despite this his followup potassium was 3.0. Patient's pain had dropped from a 9/10 to a 6/10.  Patient was given extra potassium orally with continued IV potassium. Repeat value was noted to be 3.3. Patient is he is being admitted for an additional 23 hour observation for correction of severe hypokalemia. He has been noted to be a potassium waster in the past.  Past Medical History  Diagnosis Date  . Sickle cell anemia   . Blood transfusion   . 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 Hypokalemia   . Mood disorder   . Pulmonary embolism   . Avascular necrosis   . Leukocytosis     Chronic  . Thrombocytosis     Chronic    Past Surgical History  Procedure Date  . Right hip replacement     08/2006  . Cholecystectomy     01/2008  . Porta cath placement   . Porta cath removal   . Umbilical hernia repair     01/2008  . Excision of left periauricular cyst     10/2009  . Excision of right ear lobe cyst with primary closur     11/2007    Family History  Problem Relation Age of Onset  . Sickle cell anemia Mother   . Sickle cell anemia Father   . Sickle cell trait Brother    Social History:  reports that he quit smoking about 17 months ago. His smoking use included Cigarettes. He quit after 13 years of use. He has never used smokeless tobacco. He reports that he does not drink  alcohol or use illicit drugs.  Allergies:  Allergies  Allergen Reactions  . Morphine And Related Hives and Rash    Pt states he is able to tolerate Dilaudid with no reactions.    Medications Prior to Admission  Medication Sig Dispense Refill  . folic acid (FOLVITE) 1 MG tablet Take 1 mg by mouth daily.      Marland Kitchen HYDROmorphone (DILAUDID) 4 MG tablet Take 1 tablet (4 mg total) by mouth every 4 (four) hours as needed for pain. For pain.  30 tablet  0  . hydroxyurea (HYDREA) 500 MG capsule Take 500 mg by mouth 2 (two) times daily. May take with food to minimize GI side effects.      Marland Kitchen lisinopril (PRINIVIL,ZESTRIL) 10 MG tablet Take 1 tablet (10 mg total) by mouth daily at 10 pm.  30 tablet  2  . OLANZapine (ZYPREXA) 10 MG tablet Take 10 mg by mouth at bedtime.      Marland Kitchen oxyCODONE (OXYCONTIN) 80 MG 12 hr tablet Take 1 tablet (80 mg total) by mouth every 12 (twelve) hours.  30 tablet  0  . potassium chloride SA (K-DUR,KLOR-CON) 20 MEQ tablet Take 1 tablet (20 mEq total) by mouth 2 (two) times daily.  60 tablet  1  . zolpidem (AMBIEN) 10 MG tablet  Take 1 tablet (10 mg total) by mouth at bedtime as needed for sleep.  30 tablet  0    Laboratory data reviewed. Dg Chest 2 View  12/10/2011  *RADIOLOGY REPORT*  Clinical Data: Shortness of breath  CHEST - 2 VIEW  Comparison: 11/21/2011  Findings: Cardiomediastinal silhouette is stable.  Again noted perihilar mild increase bronchial markings.  Stable bilateral mild interstitial prominence.  No segmental infiltrate or pulmonary edema.  Bony thorax is stable.  IMPRESSION:  Again noted perihilar mild increase bronchial markings.  Stable bilateral mild interstitial prominence.  No segmental infiltrate or pulmonary edema.  Bony thorax is stable.   Original Report Authenticated By: Natasha Mead, M.D.     Review of systems as noted above. Patient does not smoke or drink. He denies any recent diarrhea prior to coming into units. He's been taking his oral potassium at home  on a regular basis.   BP: 113/77, pulse 91, temperature 98.7, height 6 feet, weight 171 pounds, O2 sat 96%.  Well-developed well-nourished black male in mild distress HEENT: No sinus tenderness. Sclera icterus. TMs are clear. Throat posterior pharynx clear NECK: No enlarged thyroid. No posterior cervical nodes. LUNGS: Clear to auscultation. No vocal fremitus left base. Left flank CVA tenderness. No rub. CV: Normal S1, S2 without S3. No murmurs or rubs presently. ABDOMEN: No masses or tenderness. MSK: Minimal tenderness lower lumbar sacral spine. No need tenderness ankle tenderness. He has an IV in his left foot. NEURO: Intact.  Assessment Hemoglobin SS disease with crisis. Left-sided chest pain. Noncardiac. Hypokalemia. Rule out potassium wasting nephropathy. Sickle cell lung disease. Poor venous access. History of acute chest syndrome. History of pulmonary embolism. Avascular necrosis of the left shoulder. Status post right hip replacement.  PLAN: Admit to hospital for for the correction evaluation of his hypokalemia. Continue IV fluids with KCl supplementation p.o. Potassium. Obtain spot urine for potassium, cortisol and aldosterone level. Continue IV Dilaudid for pain control. PICC line if prolonged hospital stay. Continue his home medication as before. Followup CBC, CMET in a.m.   Margueritte Guthridge 12/11/2011,4:00 PM

## 2011-12-12 NOTE — Progress Notes (Signed)
Initial review for inpatient status is complete. 

## 2011-12-12 NOTE — Progress Notes (Signed)
Brief HPI: [name] is a 31 y.o. AA male with SS genotype Sickle Cell Disease who presented to the [facility] on 12/10/11 with Left side non-pleuritic chest pain, DOE and transient wheezing accompanied by blurred vision, sinus congestion and post nasal drip. Initial labs revealed elevated Retic 11.0%, WBC 26.9, and T. Bili 3.1 indicative of active hemolysis. He also acknowledged a recent sick exposure but was afebrile with negative chest x-ray for acute infiltrate or effusions.  After 23 hours observation, he was noted to having increasing limb pain and in light of severe hypokalemia (K level 3.0 despite po/IV potassium), the decision was made to admit him for an additional 23 hours observation. Unfortunately, this am he lost his IV access, had ongoing pain with an increasing WBC 27.1, T. Bili 3.6 and the need for additional evaluation and treatment Sickle Cell Crisis and Leukocytosis was warranted.  Subjective: [name] was seen on rounds this am. He was found to have beads of sweat on his forehead when I entered the room but Temp check remained afebrile at 98.5. He complained of constant dull with intermittent sharp pain to his left side and ribs currently rated 8/10. He denies this pain to be pleuritic or associated with SOB. He denies having any further limb pain. He also denies fever, chills, nausea, vomiting, abdominal pain, headache, dizziness, LUTS, priapism, or changes to his bowel or bladder functioning. He reports his last BM yesterday. Appetite fair.      Allergies  Allergen Reactions  . Morphine And Related Hives and Rash    Pt states he is able to tolerate Dilaudid with no reactions.   Current Facility-Administered Medications  Medication Dose Route Frequency Provider Last Rate Last Dose  . 0.45 % NaCl with KCl 20 mEq / L infusion   Intravenous Continuous Gwenyth Bender, MD 100 mL/hr at 12/11/11 1556    . diphenhydrAMINE (BENADRYL) capsule 25-50 mg  25-50 mg Oral Q4H PRN Gwenyth Bender,  MD       Or  . diphenhydrAMINE (BENADRYL) injection 12.5-25 mg  12.5-25 mg Intravenous Q4H PRN Gwenyth Bender, MD   25 mg at 12/12/11 3086  . folic acid (FOLVITE) tablet 1 mg  1 mg Oral Daily Gwenyth Bender, MD   1 mg at 12/12/11 0926  . HYDROmorphone (DILAUDID) injection 2-4 mg  2-4 mg Intravenous Q2H PRN Gwenyth Bender, MD   2 mg at 12/12/11 [phone]  . HYDROmorphone (DILAUDID) tablet 4 mg  4 mg Oral Q4H PRN Gwenyth Bender, MD   4 mg at 12/12/11 0926  . hydroxyurea (HYDREA) capsule 500 mg  500 mg Oral BID Gwenyth Bender, MD   500 mg at 12/12/11 0926  . lisinopril (PRINIVIL,ZESTRIL) tablet 10 mg  10 mg Oral Q2200 Gwenyth Bender, MD   10 mg at 12/11/11 2221  . moxifloxacin (AVELOX) IVPB 400 mg  400 mg Intravenous Q24H Gwenyth Bender, MD   400 mg at 12/11/11 1819  . OLANZapine (ZYPREXA) tablet 10 mg  10 mg Oral QHS Gwenyth Bender, MD   10 mg at 12/11/11 2221  . ondansetron (ZOFRAN) tablet 4 mg  4 mg Oral Q4H PRN Gwenyth Bender, MD       Or  . ondansetron [facility]) injection 4 mg  4 mg Intravenous Q4H PRN Gwenyth Bender, MD   4 mg at 12/12/11 0643  . OxyCODONE (OXYCONTIN) 12 hr tablet 80 mg  80 mg Oral Q12H Gwenyth Bender,  MD   80 mg at 12/12/11 0926  . potassium chloride SA (K-DUR,KLOR-CON) CR tablet 40 mEq  40 mEq Oral TID Gwenyth Bender, MD   40 mEq at 12/12/11 0926  . zolpidem (AMBIEN) tablet 10 mg  10 mg Oral QHS PRN Gwenyth Bender, MD   10 mg at 12/12/11 0110  . [DISCONTINUED] potassium chloride SA (K-DUR,KLOR-CON) CR tablet 30 mEq  30 mEq Oral TID Gwenyth Bender, MD   30 mEq at 12/11/11 0951    Objective: Blood pressure 114/67, pulse 91, temperature 98.6 F (37 C), temperature source Oral, resp. rate 17, height 6' (1.829 m), weight 77.792 kg (171 lb 8 oz), SpO2 97.00%.  General Appearance: Alert and oriented, cooperative, well developed, well nourished, mild distress  Head: Normocephalic, without obvious abnormality, atraumatic  Eyes: PERRL, slight strabismus, mild icteric sclera  Nose: Nares normal, septum midline, mucosa normal,  no drainage or sinus tenderness.  Throat: Normal lips, mucosa, tongue and gums  Back: Symmetric, mild CVA/flank tenderness, lower back tenderness  Resp: even and nonlabored, diminished bilateral bases, no vocal fremitus; no wheezes/rales/rhonchi  Cardio: S1, S2 regular, no murmurs/clicks/rubs/gallops  GI: Soft, ND, active bowel sounds, no masses, no organomegaly appreciated  Male Genitalia: Deferred, the patient denies priapism  Extremities: Extremities normal, atraumatic, no cyanosis or edema, negative Homans sign, no sign of DVT, no tenderness  Pulses: 2+ and symmetric  Skin: Skin color, texture, turgor normal, no rashes or lesions, numerous tattoos  Neurologic: Grossly normal, CN II-XII intact, no focal deficits  Psych: Appropriate affect   Lab results: Results for orders placed during the hospital encounter of 12/10/11 (from the past 48 hour(s))  RETICULOCYTES     Status: Abnormal   Collection Time   12/10/11  1:25 PM      Component Value Range Comment   Retic Ct Pct 11.0 (*) 0.4 - 3.1 %    RBC. 2.71 (*) 4.22 - 5.81 MIL/uL    Retic Count, Manual 298.1 (*) 19.0 - 186.0 K/uL   COMPREHENSIVE METABOLIC PANEL     Status: Abnormal   Collection Time   12/10/11  1:25 PM      Component Value Range Comment   Sodium 137  135 - 145 mEq/L    Potassium 3.2 (*) 3.5 - 5.1 mEq/L    Chloride 103  96 - 112 mEq/L    CO2 21  19 - 32 mEq/L    Glucose, Bld 109 (*) 70 - 99 mg/dL    BUN 4 (*) 6 - 23 mg/dL    Creatinine, Ser 4.09  0.50 - 1.35 mg/dL    Calcium 8.6  8.4 - 81.1 mg/dL    Total Protein 7.3  6.0 - 8.3 g/dL    Albumin 3.6  3.5 - 5.2 g/dL    AST 36  0 - 37 U/L    ALT 24  0 - 53 U/L    Alkaline Phosphatase 89  39 - 117 U/L    Total Bilirubin 3.1 (*) 0.3 - 1.2 mg/dL    GFR calc non Af Amer >90  >90 mL/min    GFR calc Af Amer >90  >90 mL/min   CBC WITH DIFFERENTIAL     Status: Abnormal   Collection Time   12/10/11  1:25 PM      Component Value Range Comment   WBC 27.6 (*) 4.0 - 10.5 K/uL      RBC 2.71 (*) 4.22 - 5.81 MIL/uL    Hemoglobin 7.9 (*)  13.0 - 17.0 g/dL    HCT 09.8 (*) 11.9 - 52.0 %    MCV 86.7  78.0 - 100.0 fL    MCH 29.2  26.0 - 34.0 pg    MCHC 33.6  30.0 - 36.0 g/dL    RDW 14.7 (*) 82.9 - 15.5 %    Platelets 322  150 - 400 K/uL    Neutrophils Relative 75  43 - 77 %    Lymphocytes Relative 11 (*) 12 - 46 %    Monocytes Relative 12  3 - 12 %    Eosinophils Relative 1  0 - 5 %    Basophils Relative 1  0 - 1 %    Neutro Abs 20.7 (*) 1.7 - 7.7 K/uL    Lymphs Abs 3.0  0.7 - 4.0 K/uL    Monocytes Absolute 3.3 (*) 0.1 - 1.0 K/uL    Eosinophils Absolute 0.3  0.0 - 0.7 K/uL    Basophils Absolute 0.3 (*) 0.0 - 0.1 K/uL    RBC Morphology POLYCHROMASIA PRESENT     COMPREHENSIVE METABOLIC PANEL     Status: Abnormal   Collection Time   12/11/11  4:15 AM      Component Value Range Comment   Sodium 137  135 - 145 mEq/L    Potassium 3.0 (*) 3.5 - 5.1 mEq/L    Chloride 104  96 - 112 mEq/L    CO2 23  19 - 32 mEq/L    Glucose, Bld 97  70 - 99 mg/dL    BUN 3 (*) 6 - 23 mg/dL    Creatinine, Ser 5.62  0.50 - 1.35 mg/dL    Calcium 8.5  8.4 - 13.0 mg/dL    Total Protein 7.2  6.0 - 8.3 g/dL    Albumin 3.6  3.5 - 5.2 g/dL    AST 33  0 - 37 U/L    ALT 24  0 - 53 U/L    Alkaline Phosphatase 96  39 - 117 U/L    Total Bilirubin 3.0 (*) 0.3 - 1.2 mg/dL    GFR calc non Af Amer >90  >90 mL/min    GFR calc Af Amer >90  >90 mL/min   CBC WITH DIFFERENTIAL     Status: Abnormal   Collection Time   12/11/11  4:15 AM      Component Value Range Comment   WBC 26.9 (*) 4.0 - 10.5 K/uL WHITE COUNT CONFIRMED ON SMEAR   RBC 2.75 (*) 4.22 - 5.81 MIL/uL    Hemoglobin 7.9 (*) 13.0 - 17.0 g/dL    HCT 86.5 (*) 78.4 - 52.0 %    MCV 85.1  78.0 - 100.0 fL    MCH 28.7  26.0 - 34.0 pg    MCHC 33.8  30.0 - 36.0 g/dL    RDW 69.6 (*) 29.5 - 15.5 %    Platelets 347  150 - 400 K/uL LARGE PLATELETS PRESENT   Neutrophils Relative 65  43 - 77 %    Lymphocytes Relative 16  12 - 46 %    Monocytes  Relative 14 (*) 3 - 12 %    Eosinophils Relative 4  0 - 5 %    Basophils Relative 1  0 - 1 %    Neutro Abs 17.4 (*) 1.7 - 7.7 K/uL    Lymphs Abs 4.3 (*) 0.7 - 4.0 K/uL    Monocytes Absolute 3.8 (*) 0.1 - 1.0 K/uL    Eosinophils Absolute 1.1 (*)  0.0 - 0.7 K/uL    Basophils Absolute 0.3 (*) 0.0 - 0.1 K/uL    RBC Morphology SICKLE CELLS     COMPREHENSIVE METABOLIC PANEL     Status: Abnormal   Collection Time   12/11/11 12:00 PM      Component Value Range Comment   Sodium 136  135 - 145 mEq/L    Potassium 3.3 (*) 3.5 - 5.1 mEq/L    Chloride 105  96 - 112 mEq/L    CO2 23  19 - 32 mEq/L    Glucose, Bld 100 (*) 70 - 99 mg/dL    BUN <3 (*) 6 - 23 mg/dL REPEATED TO VERIFY   Creatinine, Ser 0.59  0.50 - 1.35 mg/dL    Calcium 8.5  8.4 - 78.2 mg/dL    Total Protein 7.0  6.0 - 8.3 g/dL    Albumin 3.4 (*) 3.5 - 5.2 g/dL    AST 31  0 - 37 U/L    ALT 22  0 - 53 U/L    Alkaline Phosphatase 93  39 - 117 U/L    Total Bilirubin 2.8 (*) 0.3 - 1.2 mg/dL    GFR calc non Af Amer >90  >90 mL/min    GFR calc Af Amer >90  >90 mL/min   COMPREHENSIVE METABOLIC PANEL     Status: Abnormal   Collection Time   12/12/11  5:30 AM      Component Value Range Comment   Sodium 136  135 - 145 mEq/L    Potassium 4.3  3.5 - 5.1 mEq/L    Chloride 107  96 - 112 mEq/L    CO2 19  19 - 32 mEq/L    Glucose, Bld 102 (*) 70 - 99 mg/dL    BUN 3 (*) 6 - 23 mg/dL    Creatinine, Ser 9.56  0.50 - 1.35 mg/dL    Calcium 8.6  8.4 - 21.3 mg/dL    Total Protein 7.6  6.0 - 8.3 g/dL    Albumin 3.7  3.5 - 5.2 g/dL    AST 32  0 - 37 U/L    ALT 23  0 - 53 U/L    Alkaline Phosphatase 94  39 - 117 U/L    Total Bilirubin 3.6 (*) 0.3 - 1.2 mg/dL    GFR calc non Af Amer >90  >90 mL/min    GFR calc Af Amer >90  >90 mL/min   CBC WITH DIFFERENTIAL     Status: Abnormal   Collection Time   12/12/11  5:30 AM      Component Value Range Comment   WBC 27.1 (*) 4.0 - 10.5 K/uL    RBC 2.84 (*) 4.22 - 5.81 MIL/uL    Hemoglobin 8.5 (*) 13.0 -  17.0 g/dL    HCT 08.6 (*) 57.8 - 52.0 %    MCV 87.0  78.0 - 100.0 fL    MCH 29.9  26.0 - 34.0 pg    MCHC 34.4  30.0 - 36.0 g/dL    RDW 46.9 (*) 62.9 - 15.5 %    Platelets 391  150 - 400 K/uL    Neutrophils Relative 71  43 - 77 %    Lymphocytes Relative 13  12 - 46 %    Monocytes Relative 12  3 - 12 %    Eosinophils Relative 4  0 - 5 %    Basophils Relative 0  0 - 1 %    Neutro Abs 19.2 (*)  1.7 - 7.7 K/uL    Lymphs Abs 3.5  0.7 - 4.0 K/uL    Monocytes Absolute 3.3 (*) 0.1 - 1.0 K/uL    Eosinophils Absolute 1.1 (*) 0.0 - 0.7 K/uL    Basophils Absolute 0.0  0.0 - 0.1 K/uL    RBC Morphology MARKED POLYCHROMASIA      WBC Morphology MILD LEFT SHIFT (1-5% METAS, OCC MYELO, OCC BANDS)   VACUOLATED NEUTROPHILS    Medications:    . 0.45 % NaCl with KCl 20 mEq / L 100 mL/hr at 12/11/11 1556      . folic acid  1 mg Oral Daily  . hydroxyurea  500 mg Oral BID  . lisinopril  10 mg Oral Q2200  . moxifloxacin  400 mg Intravenous Q24H  . OLANZapine  10 mg Oral QHS  . OxyCODONE  80 mg Oral Q12H  . potassium chloride  40 mEq Oral TID  . [DISCONTINUED] potassium chloride  30 mEq Oral TID    I  have reviewed scheduled and prn medications.   Studies/Results: No results found.  Patient Active Problem List  Diagnosis  . Sickle cell pain crisis  . Leukocytosis  . Chronic pain  . Hypokalemia  . Metabolic acidosis  . Thrombocytosis  . Hemochromatosis  . Hypomagnesemia  . Acute chest syndrome due to sickle cell crisis  . Diarrhea  . Elevated brain natriuretic peptide (BNP) level  . Bronchitis  . Sickle cell anemia  . Acute embolism and thrombosis of right internal jugular vein  . Avascular necrosis  . Metabolic encephalopathy  . Increased ammonia level     Assessment/Plan:  1) Sickle Cell Crisis- elevated Retic 11.0% on adm; T. Bili increased to 3.6 this am; (T. Bili 3.1 on adm); currently has no IV access; resume gentle IV hydration following PICC placement; anti-emetic prn,  anti-pruretic prn; and bowel management regimen; most recent Hgb electrophoresis on 11/07/11 with Hgb A 23.4, Hgb F 5.5%, and Hgb S 67.7%; on Hydrea and folic Acid daily; start Lovenox DVT; follow  2) Acute on Chronic Pain- with no IV access, will continue dilaudid po and give   Dilaudid 4mg  IM every 3 hrs prn severe breakthrough pain; will also continue long-acting Oxycontin 80 mg BID; follow  3) Left Side CP/DOE/history ACS- CXR on adm negative acute findings (mild increased bronchial markings); currently denies SOB/DOE with recent negative CTA chest Pulmonary Emboli (11/21/11); continue Incentive Spirometry; add prn Albuterol MDI; follow  4) Anemia of SCD- Hgb 8.5 this am; (7.9 on adm [facility]); noting active hemolysis, suspect will decline; continue to follow closely and transfuse 1 unit if <7.0  5) Leukocytosis with Mild Left Shift- WBC has increased to 27.1 this adm; empirically placed on IV Avelox; last dose last pm; resume when has IV access or change to po; will complete work-up and get Urine Cx, Bld Cx (recent sick exposure); remains afebrile and therefore suspect secondary to inflammatory response with SCD; follow  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; repeat Ferritin level; resume IV Desferal while inpatient; continue Exjade 500mg  po daily at discharge; follow  7) Hypokalemia- K level 4.3 this am; currently on po TID and 20 meq in IVF; normally takes BID at home; will discontinue K in IVF and decrease 40 meq po to BID; will also repeat K level 1800 today; follow 8) Insomnia- has Ambien 10mg  po qhs prn; follow  9) HTN- controlled on Lisinopril 10mg  po daily for cardiorenal protection; add parameters (hold  for SBP <110); follow closely   10) Mood Disorder- stable on Zyprexa  11) Vascular Access- awaiting PICC line placement; has appointment with Dr. Avel Peace on January 03, 2012 at 9:20 am to evaluate for Port-A-Cath placement. 12) Disposition- discharge when  able to manage his pain at home.  The plan of care for this patient has been discussed with Dr. Renea Ee, Sonali Wivell S 12/12/2011 11:28 AM  LOS: 2 days

## 2011-12-13 ENCOUNTER — Inpatient Hospital Stay (HOSPITAL_COMMUNITY): Payer: Medicare Other

## 2011-12-13 LAB — CBC WITH DIFFERENTIAL/PLATELET
Eosinophils Relative: 5 % (ref 0–5)
Lymphs Abs: 2.7 10*3/uL (ref 0.7–4.0)
MCV: 86.8 fL (ref 78.0–100.0)
Monocytes Relative: 13 % — ABNORMAL HIGH (ref 3–12)
Neutrophils Relative %: 67 % (ref 43–77)
Platelets: 321 10*3/uL (ref 150–400)
RBC: 2.65 MIL/uL — ABNORMAL LOW (ref 4.22–5.81)
WBC: 19.6 10*3/uL — ABNORMAL HIGH (ref 4.0–10.5)

## 2011-12-13 LAB — COMPREHENSIVE METABOLIC PANEL
Albumin: 3.6 g/dL (ref 3.5–5.2)
BUN: 6 mg/dL (ref 6–23)
Calcium: 8.7 mg/dL (ref 8.4–10.5)
Chloride: 106 mEq/L (ref 96–112)
Creatinine, Ser: 0.64 mg/dL (ref 0.50–1.35)
Total Bilirubin: 3.1 mg/dL — ABNORMAL HIGH (ref 0.3–1.2)

## 2011-12-13 MED ORDER — PROMETHAZINE HCL 25 MG/ML IJ SOLN
25.0000 mg | INTRAMUSCULAR | Status: DC | PRN
  Administered 2011-12-13 – 2011-12-19 (×25): 25 mg via INTRAVENOUS
  Filled 2011-12-13 (×25): qty 1

## 2011-12-13 NOTE — Progress Notes (Signed)
Subjective: Mr. Paar was seen on rounds today. He just returned from IR getting his PICC line placed. He remains in a significant amount of pain but reports better today. He continues to have a constant dull with intermittent sharp pain to his left side, ribs, and now left  Hip since yesterday evening. All of his pain is currently rated 6-7/10.  He currently denies fever, chills, nausea, vomiting, abdominal pain, headache, dizziness, LUTS, priapism, or changes to his bowel or bladder functioning. His last BM was yesterday. Appetite remains fair.      Allergies  Allergen Reactions  . Morphine And Related Hives and Rash    Pt states he is able to tolerate Dilaudid with no reactions.   Current Facility-Administered Medications  Medication Dose Route Frequency Provider Last Rate Last Dose  . 0.45 % sodium chloride infusion   Intravenous Continuous Lizbeth Bark, FNP 100 mL/hr at 12/12/11 1858    . albuterol (PROVENTIL HFA;VENTOLIN HFA) 108 (90 BASE) MCG/ACT inhaler 2 puff  2 puff Inhalation Q4H PRN Lizbeth Bark, FNP      . deferoxamine (DESFERAL) 2,000 mg in dextrose 5 % 500 mL infusion  2,000 mg Intravenous QHS Lizbeth Bark, FNP      . diphenhydrAMINE (BENADRYL) capsule 25-50 mg  25-50 mg Oral Q4H PRN Gwenyth Bender, MD       Or  . diphenhydrAMINE (BENADRYL) injection 12.5-25 mg  12.5-25 mg Intravenous Q4H PRN Gwenyth Bender, MD   12.5 mg at 12/12/11 1859  . enoxaparin (LOVENOX) injection 40 mg  40 mg Subcutaneous Daily Gwenyth Bender, MD   40 mg at 12/12/11 1335  . folic acid (FOLVITE) tablet 1 mg  1 mg Oral Daily Gwenyth Bender, MD   1 mg at 12/12/11 0926  . HYDROmorphone (DILAUDID) injection 2-4 mg  2-4 mg Intravenous Q2H PRN Gwenyth Bender, MD   4 mg at 12/12/11 1859  . HYDROmorphone (DILAUDID) injection 4 mg  4 mg Intramuscular Q3H PRN Lizbeth Bark, FNP   4 mg at 12/13/11 4540  . HYDROmorphone (DILAUDID) tablet 4 mg  4 mg Oral Q4H PRN Gwenyth Bender, MD   4 mg at 12/12/11 0926  . hydroxyurea (HYDREA) capsule 500  mg  500 mg Oral BID Gwenyth Bender, MD   500 mg at 12/12/11 2121  . lisinopril (PRINIVIL,ZESTRIL) tablet 10 mg  10 mg Oral Q2200 Lizbeth Bark, FNP   10 mg at 12/12/11 2121  . moxifloxacin (AVELOX) IVPB 400 mg  400 mg Intravenous Q24H Gwen Her, PHARMD       Or  . moxifloxacin (AVELOX) tablet 400 mg  400 mg Oral Q24H Gwen Her, PHARMD   400 mg at 12/12/11 1948  . OLANZapine (ZYPREXA) tablet 10 mg  10 mg Oral QHS Gwenyth Bender, MD   10 mg at 12/12/11 2121  . ondansetron (ZOFRAN) tablet 4 mg  4 mg Oral Q4H PRN Gwenyth Bender, MD       Or  . ondansetron [facility]) injection 4 mg  4 mg Intravenous Q4H PRN Gwenyth Bender, MD   4 mg at 12/12/11 0643  . OxyCODONE (OXYCONTIN) 12 hr tablet 80 mg  80 mg Oral Q12H Gwenyth Bender, MD   80 mg at 12/12/11 2121  . potassium chloride SA (K-DUR,KLOR-CON) CR tablet 40 mEq  40 mEq Oral BID Lizbeth Bark, FNP      . zolpidem [facility]) tablet 10 mg  10 mg  Oral QHS PRN Gwenyth Bender, MD   10 mg at 12/12/11 2307  . [DISCONTINUED] 0.45 % NaCl with KCl 20 mEq / L infusion   Intravenous Continuous Gwenyth Bender, MD 100 mL/hr at 12/11/11 1556    . [DISCONTINUED] moxifloxacin (AVELOX) IVPB 400 mg  400 mg Intravenous Q24H Lizbeth Bark, FNP   400 mg at 12/11/11 1819  . [DISCONTINUED] potassium chloride SA (K-DUR,KLOR-CON) CR tablet 40 mEq  40 mEq Oral TID Gwenyth Bender, MD   40 mEq at 12/12/11 0926    Objective: Blood pressure 106/58, pulse 89, temperature 98.7 F (37.1 C), temperature source Oral, resp. rate 18, height 6' (1.829 m), weight 78.019 kg (172 lb), SpO2 94.00%.  General Appearance: Alert and oriented, cooperative, well developed, well nourished, mild distress  Head: Normocephalic, without obvious abnormality, atraumatic  Eyes: PERRL, slight strabismus, mild icteric sclera  Nose: Nares normal, septum midline, mucosa normal, no drainage or sinus tenderness.  Throat: Normal lips, mucosa, tongue and gums  Back: Symmetric, mild CVA/flank tenderness, lower back tenderness    Resp: even and nonlabored, diminished bilateral bases, no vocal fremitus; no wheezes/rales/rhonchi  Cardio: S1, S2 regular, no murmurs/clicks/rubs/gallops  GI: Soft, ND, active bowel sounds, no masses, no organomegaly appreciated  Male Genitalia: Deferred, the patient denies priapism  Extremities: Extremities normal, atraumatic, no cyanosis or edema, negative Homans sign, no sign of DVT, no tenderness; R upper arm PICC  Pulses: 2+ and symmetric  Skin: Skin color, texture, turgor normal, no rashes or lesions, numerous tattoos  Neurologic: Grossly normal, CN II-XII intact, no focal deficits  Psych: Appropriate affect   Lab results: Results for orders placed during the hospital encounter of 12/10/11 (from the past 48 hour(s))  COMPREHENSIVE METABOLIC PANEL     Status: Abnormal   Collection Time   12/11/11 12:00 PM      Component Value Range Comment   Sodium 136  135 - 145 mEq/L    Potassium 3.3 (*) 3.5 - 5.1 mEq/L    Chloride 105  96 - 112 mEq/L    CO2 23  19 - 32 mEq/L    Glucose, Bld 100 (*) 70 - 99 mg/dL    BUN <3 (*) 6 - 23 mg/dL REPEATED TO VERIFY   Creatinine, Ser 0.59  0.50 - 1.35 mg/dL    Calcium 8.5  8.4 - 16.1 mg/dL    Total Protein 7.0  6.0 - 8.3 g/dL    Albumin 3.4 (*) 3.5 - 5.2 g/dL    AST 31  0 - 37 U/L    ALT 22  0 - 53 U/L    Alkaline Phosphatase 93  39 - 117 U/L    Total Bilirubin 2.8 (*) 0.3 - 1.2 mg/dL    GFR calc non Af Amer >90  >90 mL/min    GFR calc Af Amer >90  >90 mL/min   COMPREHENSIVE METABOLIC PANEL     Status: Abnormal   Collection Time   12/12/11  5:30 AM      Component Value Range Comment   Sodium 136  135 - 145 mEq/L    Potassium 4.3  3.5 - 5.1 mEq/L    Chloride 107  96 - 112 mEq/L    CO2 19  19 - 32 mEq/L    Glucose, Bld 102 (*) 70 - 99 mg/dL    BUN 3 (*) 6 - 23 mg/dL    Creatinine, Ser 0.96  0.50 - 1.35 mg/dL    Calcium 8.6  8.4 - 10.5 mg/dL    Total Protein 7.6  6.0 - 8.3 g/dL    Albumin 3.7  3.5 - 5.2 g/dL    AST 32  0 - 37 U/L    ALT  23  0 - 53 U/L    Alkaline Phosphatase 94  39 - 117 U/L    Total Bilirubin 3.6 (*) 0.3 - 1.2 mg/dL    GFR calc non Af Amer >90  >90 mL/min    GFR calc Af Amer >90  >90 mL/min   CBC WITH DIFFERENTIAL     Status: Abnormal   Collection Time   12/12/11  5:30 AM      Component Value Range Comment   WBC 27.1 (*) 4.0 - 10.5 K/uL    RBC 2.84 (*) 4.22 - 5.81 MIL/uL    Hemoglobin 8.5 (*) 13.0 - 17.0 g/dL    HCT 16.1 (*) 09.6 - 52.0 %    MCV 87.0  78.0 - 100.0 fL    MCH 29.9  26.0 - 34.0 pg    MCHC 34.4  30.0 - 36.0 g/dL    RDW 04.5 (*) 40.9 - 15.5 %    Platelets 391  150 - 400 K/uL    Neutrophils Relative 71  43 - 77 %    Lymphocytes Relative 13  12 - 46 %    Monocytes Relative 12  3 - 12 %    Eosinophils Relative 4  0 - 5 %    Basophils Relative 0  0 - 1 %    Neutro Abs 19.2 (*) 1.7 - 7.7 K/uL    Lymphs Abs 3.5  0.7 - 4.0 K/uL    Monocytes Absolute 3.3 (*) 0.1 - 1.0 K/uL    Eosinophils Absolute 1.1 (*) 0.0 - 0.7 K/uL    Basophils Absolute 0.0  0.0 - 0.1 K/uL    RBC Morphology MARKED POLYCHROMASIA      WBC Morphology MILD LEFT SHIFT (1-5% METAS, OCC MYELO, OCC BANDS)   VACUOLATED NEUTROPHILS  CULTURE, BLOOD (ROUTINE X 2)     Status: Normal (Preliminary result)   Collection Time   12/12/11  1:45 PM      Component Value Range Comment   Specimen Description BLOOD RIGHT HAND      Special Requests BOTTLES DRAWN AEROBIC ONLY 4CC      Culture  Setup Time 12/12/2011 22:23      Culture        Value:        BLOOD CULTURE RECEIVED NO GROWTH TO DATE CULTURE WILL BE HELD FOR 5 DAYS BEFORE ISSUING A FINAL NEGATIVE REPORT   Report Status PENDING     CULTURE, BLOOD (ROUTINE X 2)     Status: Normal (Preliminary result)   Collection Time   12/12/11  1:52 PM      Component Value Range Comment   Specimen Description BLOOD LEFT HAND      Special Requests BOTTLES DRAWN AEROBIC ONLY 1CC      Culture  Setup Time 12/12/2011 22:23      Culture        Value:        BLOOD CULTURE RECEIVED NO GROWTH TO DATE  CULTURE WILL BE HELD FOR 5 DAYS BEFORE ISSUING A FINAL NEGATIVE REPORT   Report Status PENDING     POTASSIUM     Status: Abnormal   Collection Time   12/12/11  6:48 PM      Component Value Range Comment  Potassium 5.3 (*) 3.5 - 5.1 mEq/L   COMPREHENSIVE METABOLIC PANEL     Status: Abnormal   Collection Time   12/13/11  7:05 AM      Component Value Range Comment   Sodium 137  135 - 145 mEq/L    Potassium 4.2  3.5 - 5.1 mEq/L    Chloride 106  96 - 112 mEq/L    CO2 21  19 - 32 mEq/L    Glucose, Bld 113 (*) 70 - 99 mg/dL    BUN 6  6 - 23 mg/dL    Creatinine, Ser 2.95  0.50 - 1.35 mg/dL    Calcium 8.7  8.4 - 62.1 mg/dL    Total Protein 7.4  6.0 - 8.3 g/dL    Albumin 3.6  3.5 - 5.2 g/dL    AST 30  0 - 37 U/L    ALT 23  0 - 53 U/L    Alkaline Phosphatase 91  39 - 117 U/L    Total Bilirubin 3.1 (*) 0.3 - 1.2 mg/dL    GFR calc non Af Amer >90  >90 mL/min    GFR calc Af Amer >90  >90 mL/min   CBC WITH DIFFERENTIAL     Status: Abnormal   Collection Time   12/13/11  7:05 AM      Component Value Range Comment   WBC 19.6 (*) 4.0 - 10.5 K/uL    RBC 2.65 (*) 4.22 - 5.81 MIL/uL    Hemoglobin 7.8 (*) 13.0 - 17.0 g/dL    HCT 30.8 (*) 65.7 - 52.0 %    MCV 86.8  78.0 - 100.0 fL    MCH 29.4  26.0 - 34.0 pg    MCHC 33.9  30.0 - 36.0 g/dL    RDW 84.6 (*) 96.2 - 15.5 %    Platelets 321  150 - 400 K/uL    Neutrophils Relative 67  43 - 77 %    Lymphocytes Relative 14  12 - 46 %    Monocytes Relative 13 (*) 3 - 12 %    Eosinophils Relative 5  0 - 5 %    Basophils Relative 1  0 - 1 %    Neutro Abs 13.2 (*) 1.7 - 7.7 K/uL    Lymphs Abs 2.7  0.7 - 4.0 K/uL    Monocytes Absolute 2.5 (*) 0.1 - 1.0 K/uL    Eosinophils Absolute 1.0 (*) 0.0 - 0.7 K/uL    Basophils Absolute 0.2 (*) 0.0 - 0.1 K/uL    RBC Morphology POLYCHROMASIA PRESENT      WBC Morphology TOXIC GRANULATION   VACUOLATED NEUTROPHILS    Medications:    . sodium chloride 100 mL/hr at 12/12/11 1858  . [DISCONTINUED] 0.45 % NaCl with  KCl 20 mEq / L 100 mL/hr at 12/11/11 1556      . deferoxamine (DESFERAL) IV  2,000 mg Intravenous QHS  . enoxaparin (LOVENOX) injection  40 mg Subcutaneous Daily  . folic acid  1 mg Oral Daily  . hydroxyurea  500 mg Oral BID  . lisinopril  10 mg Oral Q2200  . moxifloxacin  400 mg Intravenous Q24H   Or  . moxifloxacin  400 mg Oral Q24H  . OLANZapine  10 mg Oral QHS  . OxyCODONE  80 mg Oral Q12H  . potassium chloride  40 mEq Oral BID  . [DISCONTINUED] moxifloxacin  400 mg Intravenous Q24H  . [DISCONTINUED] potassium chloride  40 mEq Oral TID  I  have reviewed scheduled and prn medications.   Studies/Results: No results found.  Patient Active Problem List  Diagnosis  . Sickle cell pain crisis  . Leukocytosis  . Chronic pain  . Hypokalemia  . Metabolic acidosis  . Thrombocytosis  . Hemochromatosis  . Hypomagnesemia  . Acute chest syndrome due to sickle cell crisis  . Diarrhea  . Elevated brain natriuretic peptide (BNP) level  . Bronchitis  . Sickle cell anemia  . Acute embolism and thrombosis of right internal jugular vein  . Avascular necrosis  . Metabolic encephalopathy  . Increased ammonia level     Assessment/Plan:  1) Sickle Cell Crisis- elevated Retic 11.0% on adm; T. Bili  3.1 this am; continue gentle IV hydration, anti-emetic prn, anti-pruretic prn; and bowel management regimen; most recent Hgb electrophoresis on 11/07/11 with Hgb A 23.4, Hgb F 5.5%, and Hgb S 67.7%; on Hydrea and folic Acid daily; continue Lovenox DVT; follow  2) Acute on Chronic Pain- resume Dilaudid IV prn severe pain and continue long-acting Oxycontin 80 mg BID; follow  3) Left Side CP/DOE/history ACS- CXR on adm negative acute findings (mild increased bronchial markings); currently denies SOB/DOE with recent negative CTA chest Pulmonary Emboli (11/21/11); continue Incentive Spirometry and prn Albuterol MDI; follow  4) Anemia of SCD- Hgb 7.8 this am; (7.9 on adm [facility]); suspect secondary to  active hemolysis, continue to follow closely and transfuse 1 unit if <7.0  5) Leukocytosis with Mild Left Shift- WBC now 19.6 this am; was given po Avelox  Yesterday; resume IV today and complete course; Urine Cx, Bld Cx  All NTD; remains afebrile; suspect secondary to inflammatory response with SCD; follow  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 Avel Peace on January 03, 2012 at 9:20 am to evaluate for Port-A-Cath placement. 12) Disposition- discharge when able to manage his pain at home.  The plan of care for this patient has been discussed with Dr. Renea Ee, Ante Arredondo S 12/13/2011 9:30 AM  LOS: 3 days

## 2011-12-13 NOTE — Procedures (Signed)
US/fluoroscopic guided right DL brachial vein PICC placed. Length 42 cm. Tip SVC/RA junction. No immediate complications.

## 2011-12-14 DIAGNOSIS — D5700 Hb-SS disease with crisis, unspecified: Secondary | ICD-10-CM | POA: Diagnosis present

## 2011-12-14 DIAGNOSIS — D72829 Elevated white blood cell count, unspecified: Secondary | ICD-10-CM

## 2011-12-14 DIAGNOSIS — R17 Unspecified jaundice: Secondary | ICD-10-CM | POA: Diagnosis present

## 2011-12-14 DIAGNOSIS — D571 Sickle-cell disease without crisis: Secondary | ICD-10-CM | POA: Diagnosis present

## 2011-12-14 LAB — COMPREHENSIVE METABOLIC PANEL
ALT: 19 U/L (ref 0–53)
AST: 24 U/L (ref 0–37)
Albumin: 3.4 g/dL — ABNORMAL LOW (ref 3.5–5.2)
Alkaline Phosphatase: 89 U/L (ref 39–117)
BUN: 6 mg/dL (ref 6–23)
Chloride: 107 mEq/L (ref 96–112)
Potassium: 4.4 mEq/L (ref 3.5–5.1)
Sodium: 137 mEq/L (ref 135–145)
Total Bilirubin: 2.3 mg/dL — ABNORMAL HIGH (ref 0.3–1.2)
Total Protein: 7.3 g/dL (ref 6.0–8.3)

## 2011-12-14 LAB — CBC WITH DIFFERENTIAL/PLATELET
Basophils Absolute: 0.2 10*3/uL — ABNORMAL HIGH (ref 0.0–0.1)
HCT: 20.1 % — ABNORMAL LOW (ref 39.0–52.0)
Lymphs Abs: 3 10*3/uL (ref 0.7–4.0)
MCH: 29.5 pg (ref 26.0–34.0)
MCHC: 34.8 g/dL (ref 30.0–36.0)
MCV: 84.8 fL (ref 78.0–100.0)
Monocytes Absolute: 2 10*3/uL — ABNORMAL HIGH (ref 0.1–1.0)
Monocytes Relative: 13 % — ABNORMAL HIGH (ref 3–12)
Neutro Abs: 8.7 10*3/uL — ABNORMAL HIGH (ref 1.7–7.7)
Platelets: 425 10*3/uL — ABNORMAL HIGH (ref 150–400)
RDW: 20.7 % — ABNORMAL HIGH (ref 11.5–15.5)

## 2011-12-14 LAB — URINE CULTURE
Colony Count: NO GROWTH
Culture: NO GROWTH

## 2011-12-14 LAB — PREPARE RBC (CROSSMATCH)

## 2011-12-14 MED ORDER — DIPHENHYDRAMINE HCL 50 MG/ML IJ SOLN
25.0000 mg | Freq: Once | INTRAMUSCULAR | Status: AC
  Administered 2011-12-14: 25 mg via INTRAVENOUS

## 2011-12-14 MED ORDER — LACTULOSE 10 GM/15ML PO SOLN
20.0000 g | Freq: Every day | ORAL | Status: DC | PRN
  Administered 2011-12-15 – 2011-12-16 (×2): 20 g via ORAL
  Filled 2011-12-14 (×2): qty 30

## 2011-12-14 MED ORDER — ACETAMINOPHEN 325 MG PO TABS: 650.0000 mg | ORAL_TABLET | Freq: Once | ORAL | Status: DC

## 2011-12-14 NOTE — Progress Notes (Signed)
Pt seen and examined on rounds today. Pt had a delay in initiation of IV analgesics secondary to lack of IV access. Case discussedwith NP  Vilinda Blanks. and I am in agreement with plan to continue Hydrea and Folic acid to a goal Hgb F of 10%. Pt also has elevated Ferritin levels thus agree with continuing Deferoxamine while hospitalized and transition to Ex jade as out patient. Appreciate Anna's efforts to arrange Port placement with Dr. Abbey Chatters on 01/03/2012.

## 2011-12-14 NOTE — Progress Notes (Signed)
Subjective: [name] was seen on rounds today. He continues to have pain to his left hip that has now began to radiate to his thigh, and lower leg without associated paresthesia. He continues to have pain in his left side, and ribs without associated SOB. His pain remains rated 6-7/10.  He continues to deny fever, chills, nausea, vomiting, abdominal pain, headache, dizziness, dysuria, hematuria, urinary urgency, frequency, priapism, or changes to his bowel or bladder functioning. His last BM was Monday. Appetite remains fair.      Allergies  Allergen Reactions  . Morphine And Related Hives and Rash    Pt states he is able to tolerate Dilaudid with no reactions.   Current Facility-Administered Medications  Medication Dose Route Frequency Provider Last Rate Last Dose  . 0.45 % sodium chloride infusion   Intravenous Continuous Lizbeth Bark, FNP 100 mL/hr at 12/14/11 0358    . albuterol (PROVENTIL HFA;VENTOLIN HFA) 108 (90 BASE) MCG/ACT inhaler 2 puff  2 puff Inhalation Q4H PRN Lizbeth Bark, FNP      . deferoxamine (DESFERAL) 2,000 mg in dextrose 5 % 500 mL infusion  2,000 mg Intravenous QHS Lizbeth Bark, FNP 125 mL/hr at 12/13/11 2229 2,000 mg at 12/13/11 2229  . diphenhydrAMINE (BENADRYL) capsule 25-50 mg  25-50 mg Oral Q4H PRN Gwenyth Bender, MD       Or  . diphenhydrAMINE (BENADRYL) injection 12.5-25 mg  12.5-25 mg Intravenous Q4H PRN Gwenyth Bender, MD   25 mg at 12/14/11 0940  . enoxaparin (LOVENOX) injection 40 mg  40 mg Subcutaneous Daily Gwenyth Bender, MD   40 mg at 12/14/11 0939  . folic acid (FOLVITE) tablet 1 mg  1 mg Oral Daily Gwenyth Bender, MD   1 mg at 12/14/11 0940  . HYDROmorphone (DILAUDID) injection 2-4 mg  2-4 mg Intravenous Q2H PRN Gwenyth Bender, MD   4 mg at 12/14/11 1131  . HYDROmorphone (DILAUDID) injection 4 mg  4 mg Intramuscular Q3H PRN Lizbeth Bark, FNP   4 mg at 12/13/11 0934  . HYDROmorphone (DILAUDID) tablet 4 mg  4 mg Oral Q4H PRN Gwenyth Bender, MD   4 mg at 12/12/11 0926  .  hydroxyurea (HYDREA) capsule 500 mg  500 mg Oral BID Gwenyth Bender, MD   500 mg at 12/14/11 0940  . lisinopril (PRINIVIL,ZESTRIL) tablet 10 mg  10 mg Oral Q2200 Lizbeth Bark, FNP   10 mg at 12/13/11 2228  . moxifloxacin (AVELOX) IVPB 400 mg  400 mg Intravenous Q24H Gwen Her, PHARMD       Or  . moxifloxacin (AVELOX) tablet 400 mg  400 mg Oral Q24H Gwen Her, PHARMD   400 mg at 12/13/11 1718  . OLANZapine (ZYPREXA) tablet 10 mg  10 mg Oral QHS Gwenyth Bender, MD   10 mg at 12/13/11 2228  . ondansetron (ZOFRAN) tablet 4 mg  4 mg Oral Q4H PRN Gwenyth Bender, MD       Or  . ondansetron [facility]) injection 4 mg  4 mg Intravenous Q4H PRN Gwenyth Bender, MD   4 mg at 12/14/11 0940  . OxyCODONE (OXYCONTIN) 12 hr tablet 80 mg  80 mg Oral Q12H Gwenyth Bender, MD   80 mg at 12/14/11 0940  . potassium chloride SA (K-DUR,KLOR-CON) CR tablet 40 mEq  40 mEq Oral BID Lizbeth Bark, FNP   40 mEq at 12/14/11 0940  . promethazine (PHENERGAN) injection 25 mg  25 mg Intravenous Q4H PRN Lizbeth Bark, FNP   25 mg at 12/14/11 1610  . zolpidem (AMBIEN) tablet 10 mg  10 mg Oral QHS PRN Gwenyth Bender, MD   10 mg at 12/13/11 2228    Objective: Blood pressure 112/68, pulse 91, temperature 97.9 F (36.6 C), temperature source Oral, resp. rate 16, height 6' (1.829 m), weight 80.468 kg (177 lb 6.4 oz), SpO2 99.00%.  General Appearance: Alert and oriented, cooperative, well developed, well nourished, mild distress  Head: Normocephalic, atraumatic  Eyes: PERRL, slight strabismus, mild icteric sclera  Back: Symmetric, no CVA tenderness Resp: even and nonlabored, diminished bilateral bases, no vocal fremitus; no wheezes/rales/rhonchi  Cardio: S1, S2 regular, no murmurs/clicks/rubs/gallops  GI: Soft, ND, active bowel sounds, no masses, no organomegaly appreciated  Male Genitalia: Deferred, the patient denies priapism  Extremities: Extremities normal, atraumatic, no cyanosis or edema, some inner lower leg tenderness with negative  Homans; R upper arm PICC  Pulses: 2+ and symmetric  Skin: Skin color, texture, turgor normal, no rashes or lesions, numerous tattoos  Neurologic: Grossly  intact, no focal deficits  Psych: Appropriate affect   Lab results: Results for orders placed during the hospital encounter of 12/10/11 (from the past 48 hour(s))  CULTURE, BLOOD (ROUTINE X 2)     Status: Normal (Preliminary result)   Collection Time   12/12/11  1:45 PM      Component Value Range Comment   Specimen Description BLOOD RIGHT HAND      Special Requests BOTTLES DRAWN AEROBIC ONLY 4CC      Culture  Setup Time 12/12/2011 22:23      Culture        Value:        BLOOD CULTURE RECEIVED NO GROWTH TO DATE CULTURE WILL BE HELD FOR 5 DAYS BEFORE ISSUING A FINAL NEGATIVE REPORT   Report Status PENDING     CULTURE, BLOOD (ROUTINE X 2)     Status: Normal (Preliminary result)   Collection Time   12/12/11  1:52 PM      Component Value Range Comment   Specimen Description BLOOD LEFT HAND      Special Requests BOTTLES DRAWN AEROBIC ONLY 1CC      Culture  Setup Time 12/12/2011 22:23      Culture        Value:        BLOOD CULTURE RECEIVED NO GROWTH TO DATE CULTURE WILL BE HELD FOR 5 DAYS BEFORE ISSUING A FINAL NEGATIVE REPORT   Report Status PENDING     URINE CULTURE     Status: Normal   Collection Time   12/12/11  3:22 PM      Component Value Range Comment   Specimen Description URINE, CLEAN CATCH      Special Requests avelox      Culture  Setup Time 12/13/2011 03:09      Colony Count NO GROWTH      Culture NO GROWTH      Report Status 12/14/2011 FINAL     POTASSIUM     Status: Abnormal   Collection Time   12/12/11  6:48 PM      Component Value Range Comment   Potassium 5.3 (*) 3.5 - 5.1 mEq/L   COMPREHENSIVE METABOLIC PANEL     Status: Abnormal   Collection Time   12/13/11  7:05 AM      Component Value Range Comment   Sodium 137  135 - 145 mEq/L    Potassium  4.2  3.5 - 5.1 mEq/L    Chloride 106  96 - 112 mEq/L    CO2  21  19 - 32 mEq/L    Glucose, Bld 113 (*) 70 - 99 mg/dL    BUN 6  6 - 23 mg/dL    Creatinine, Ser 1.61  0.50 - 1.35 mg/dL    Calcium 8.7  8.4 - 09.6 mg/dL    Total Protein 7.4  6.0 - 8.3 g/dL    Albumin 3.6  3.5 - 5.2 g/dL    AST 30  0 - 37 U/L    ALT 23  0 - 53 U/L    Alkaline Phosphatase 91  39 - 117 U/L    Total Bilirubin 3.1 (*) 0.3 - 1.2 mg/dL    GFR calc non Af Amer >90  >90 mL/min    GFR calc Af Amer >90  >90 mL/min   CBC WITH DIFFERENTIAL     Status: Abnormal   Collection Time   12/13/11  7:05 AM      Component Value Range Comment   WBC 19.6 (*) 4.0 - 10.5 K/uL    RBC 2.65 (*) 4.22 - 5.81 MIL/uL    Hemoglobin 7.8 (*) 13.0 - 17.0 g/dL    HCT 04.5 (*) 40.9 - 52.0 %    MCV 86.8  78.0 - 100.0 fL    MCH 29.4  26.0 - 34.0 pg    MCHC 33.9  30.0 - 36.0 g/dL    RDW 81.1 (*) 91.4 - 15.5 %    Platelets 321  150 - 400 K/uL    Neutrophils Relative 67  43 - 77 %    Lymphocytes Relative 14  12 - 46 %    Monocytes Relative 13 (*) 3 - 12 %    Eosinophils Relative 5  0 - 5 %    Basophils Relative 1  0 - 1 %    Neutro Abs 13.2 (*) 1.7 - 7.7 K/uL    Lymphs Abs 2.7  0.7 - 4.0 K/uL    Monocytes Absolute 2.5 (*) 0.1 - 1.0 K/uL    Eosinophils Absolute 1.0 (*) 0.0 - 0.7 K/uL    Basophils Absolute 0.2 (*) 0.0 - 0.1 K/uL    RBC Morphology POLYCHROMASIA PRESENT      WBC Morphology TOXIC GRANULATION   VACUOLATED NEUTROPHILS  FERRITIN     Status: Abnormal   Collection Time   12/13/11  8:25 AM      Component Value Range Comment   Ferritin 2022 (*) 22 - 322 ng/mL Result confirmed by automatic dilution.  COMPREHENSIVE METABOLIC PANEL     Status: Abnormal   Collection Time   12/14/11  5:00 AM      Component Value Range Comment   Sodium 137  135 - 145 mEq/L    Potassium 4.4  3.5 - 5.1 mEq/L    Chloride 107  96 - 112 mEq/L    CO2 23  19 - 32 mEq/L    Glucose, Bld 103 (*) 70 - 99 mg/dL    BUN 6  6 - 23 mg/dL    Creatinine, Ser 7.82  0.50 - 1.35 mg/dL    Calcium 8.4  8.4 - 95.6 mg/dL     Total Protein 7.3  6.0 - 8.3 g/dL    Albumin 3.4 (*) 3.5 - 5.2 g/dL    AST 24  0 - 37 U/L    ALT 19  0 - 53 U/L  Alkaline Phosphatase 89  39 - 117 U/L    Total Bilirubin 2.3 (*) 0.3 - 1.2 mg/dL    GFR calc non Af Amer >90  >90 mL/min    GFR calc Af Amer >90  >90 mL/min   CBC WITH DIFFERENTIAL     Status: Abnormal   Collection Time   12/14/11  5:00 AM      Component Value Range Comment   WBC 15.1 (*) 4.0 - 10.5 K/uL WHITE COUNT CONFIRMED ON SMEAR   RBC 2.37 (*) 4.22 - 5.81 MIL/uL    Hemoglobin 7.0 (*) 13.0 - 17.0 g/dL    HCT 16.1 (*) 09.6 - 52.0 %    MCV 84.8  78.0 - 100.0 fL    MCH 29.5  26.0 - 34.0 pg    MCHC 34.8  30.0 - 36.0 g/dL    RDW 04.5 (*) 40.9 - 15.5 %    Platelets 425 (*) 150 - 400 K/uL    Neutrophils Relative 58  43 - 77 %    Lymphocytes Relative 20  12 - 46 %    Monocytes Relative 13 (*) 3 - 12 %    Eosinophils Relative 8 (*) 0 - 5 %    Basophils Relative 1  0 - 1 %    Neutro Abs 8.7 (*) 1.7 - 7.7 K/uL    Lymphs Abs 3.0  0.7 - 4.0 K/uL    Monocytes Absolute 2.0 (*) 0.1 - 1.0 K/uL    Eosinophils Absolute 1.2 (*) 0.0 - 0.7 K/uL    Basophils Absolute 0.2 (*) 0.0 - 0.1 K/uL    RBC Morphology TARGET CELLS      WBC Morphology VACUOLATED NEUTROPHILS   HYPERSEGMENTED NEUT    Medications:    . sodium chloride 100 mL/hr at 12/14/11 0358      . deferoxamine (DESFERAL) IV  2,000 mg Intravenous QHS  . enoxaparin (LOVENOX) injection  40 mg Subcutaneous Daily  . folic acid  1 mg Oral Daily  . hydroxyurea  500 mg Oral BID  . lisinopril  10 mg Oral Q2200  . moxifloxacin  400 mg Intravenous Q24H   Or  . moxifloxacin  400 mg Oral Q24H  . OLANZapine  10 mg Oral QHS  . OxyCODONE  80 mg Oral Q12H  . potassium chloride  40 mEq Oral BID    I  have reviewed scheduled and prn medications.   Studies/Results: Ir Fluoro Guide Cv Line Right  12/13/2011  *RADIOLOGY REPORT*  Clinical Data: Sickle cell crisis, poor venous access.  Request is made for central venous access  for fluids and medications.  PICC LINE PLACEMENT WITH ULTRASOUND AND FLUOROSCOPIC  GUIDANCE  Fluoroscopy Time: 0.3 minutes.  The right arm was prepped with chlorhexidine, draped in the usual sterile fashion using maximum barrier technique (cap and mask, sterile gown, sterile gloves, large sterile sheet, hand hygiene and cutaneous antisepsis) and infiltrated locally with 1% Lidocaine.  Ultrasound demonstrated patency of the right brachial vein, and this was documented with an image.  Under real-time ultrasound guidance, this vein was accessed with a 21 gauge micropuncture needle and image documentation was performed.  The needle was exchanged over a guidewire for a peel-away sheath through which a five [place] double lumen PICC trimmed to 42 cm was advanced, positioned with its tip at the lower SVC/right atrial junction. Fluoroscopy during the procedure and fluoro spot radiograph confirms appropriate catheter position.  The catheter was flushed, secured to the skin with Prolene sutures,  and covered with a sterile dressing.  Complications:  None  IMPRESSION: Successful right arm PICC line placement with ultrasound and fluoroscopic guidance.  The catheter is ready for use.  Read by: Jeananne Rama, P.A.-C   Original Report Authenticated By: Richarda Overlie, M.D.    Ir US Guide Vasc Access Right  12/13/2011  *RADIOLOGY REPORT*  Clinical Data: Sickle cell crisis, poor venous access.  Request is made for central venous access for fluids and medications.  PICC LINE PLACEMENT WITH ULTRASOUND AND FLUOROSCOPIC  GUIDANCE  Fluoroscopy Time: 0.3 minutes.  The right arm was prepped with chlorhexidine, draped in the usual sterile fashion using maximum barrier technique (cap and mask, sterile gown, sterile gloves, large sterile sheet, hand hygiene and cutaneous antisepsis) and infiltrated locally with 1% Lidocaine.  Ultrasound demonstrated patency of the right brachial vein, and this was documented with an image.  Under real-time  ultrasound guidance, this vein was accessed with a 21 gauge micropuncture needle and image documentation was performed.  The needle was exchanged over a guidewire for a peel-away sheath through which a five [place] double lumen PICC trimmed to 42 cm was advanced, positioned with its tip at the lower SVC/right atrial junction. Fluoroscopy during the procedure and fluoro spot radiograph confirms appropriate catheter position.  The catheter was flushed, secured to the skin with Prolene sutures, and covered with a sterile dressing.  Complications:  None  IMPRESSION: Successful right arm PICC line placement with ultrasound and fluoroscopic guidance.  The catheter is ready for use.  Read by: Jeananne Rama, P.A.-C   Original Report Authenticated By: Richarda Overlie, M.D.     Patient Active Problem List  Diagnosis  . Sickle cell pain crisis  . Leukocytosis  . Chronic pain  . Hypokalemia  . Metabolic acidosis  . Thrombocytosis  . Hemochromatosis  . Hypomagnesemia  . Acute chest syndrome due to sickle cell crisis  . Diarrhea  . Elevated brain natriuretic peptide (BNP) level  . Bronchitis  . Sickle cell anemia  . Acute embolism and thrombosis of right internal jugular vein  . Avascular necrosis  . Metabolic encephalopathy  . Increased ammonia level     Assessment/Plan:  1) Sickle Cell Crisis- resolving with T. Bili  2.3 this am (trending down from 3.6 on adm); continue gentle IV hydration, anti-emetic prn, anti-pruretic prn; and bowel management regimen (added Lactulose prn); most recent Hgb electrophoresis on 11/07/11 with Hgb A 23.4, Hgb F 5.5%, and Hgb S 67.7%; on Hydrea and folic Acid daily; continue Lovenox DVT; follow  2) Acute on Chronic Pain- continue Dilaudid IV prn severe pain and long-acting Oxycontin 80 mg BID; follow  3) Left Side CP/DOE/history ACS- resolved; continue to encourage Incentive Spirometry and prn Albuterol MDI; follow  4) Anemia of SCD- Hgb 7.0 this am; trending down from 7.9 on  adm [facility]; suspect secondary to active hemolysis, continue to follow closely and transfuse 1 unit if <7.0  5) Leukocytosis with Mild Left Shift- WBC 15.1 this am and continues to trend down; work-up NTD; remains afebrile (97.9); suspect secondary to inflammatory response with SCD; change IV Avelox to po to complete 7 day course; follow  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 Avel Peace on January 03, 2012 at 9:20 am to evaluate for Port-A-Cath placement. 12) Disposition- discharge when able to manage his pain at home.  The plan of care for this patient has been discussed with Dr. Renea Ee, [place] Junio S 12/14/2011 12:27 PM  LOS: 4 days

## 2011-12-15 ENCOUNTER — Inpatient Hospital Stay (HOSPITAL_COMMUNITY): Payer: Medicare Other

## 2011-12-15 LAB — COMPREHENSIVE METABOLIC PANEL
AST: 28 U/L (ref 0–37)
Albumin: 3.6 g/dL (ref 3.5–5.2)
BUN: 6 mg/dL (ref 6–23)
Chloride: 105 mEq/L (ref 96–112)
Creatinine, Ser: 0.61 mg/dL (ref 0.50–1.35)
Potassium: 4.5 mEq/L (ref 3.5–5.1)
Total Protein: 7.5 g/dL (ref 6.0–8.3)

## 2011-12-15 LAB — CBC WITH DIFFERENTIAL/PLATELET
Basophils Relative: 1 % (ref 0–1)
Eosinophils Relative: 9 % — ABNORMAL HIGH (ref 0–5)
HCT: 20.4 % — ABNORMAL LOW (ref 39.0–52.0)
Lymphs Abs: 4.2 10*3/uL — ABNORMAL HIGH (ref 0.7–4.0)
MCH: 29.7 pg (ref 26.0–34.0)
MCV: 85.4 fL (ref 78.0–100.0)
Monocytes Absolute: 2.2 10*3/uL — ABNORMAL HIGH (ref 0.1–1.0)
Monocytes Relative: 14 % — ABNORMAL HIGH (ref 3–12)
Neutro Abs: 7.6 10*3/uL (ref 1.7–7.7)
RBC: 2.39 MIL/uL — ABNORMAL LOW (ref 4.22–5.81)
WBC: 15.6 10*3/uL — ABNORMAL HIGH (ref 4.0–10.5)

## 2011-12-15 MED ORDER — POTASSIUM CHLORIDE CRYS ER 20 MEQ PO TBCR
20.0000 meq | EXTENDED_RELEASE_TABLET | Freq: Two times a day (BID) | ORAL | Status: DC
  Administered 2011-12-15 – 2011-12-19 (×8): 20 meq via ORAL
  Filled 2011-12-15 (×9): qty 1

## 2011-12-15 NOTE — Progress Notes (Signed)
Patient seen and examined and discussed with nurse practitioner and occur. Agree with assessment and plan.

## 2011-12-15 NOTE — Progress Notes (Signed)
Subjective: [name] was seen on rounds today. He continues to have pain to his left hip that continues to radiate to the thigh and knee without associated paresthesia. He also continues to have pain in his left side, and ribs without associated SOB. He continues to report all of his pain rated 6-7/10.  He also continues to deny fever, chills, nausea, vomiting, abdominal pain, constipation, headache, dizziness, dysuria, hematuria, urinary urgency, frequency, priapism, or changes to his bowel or bladder functioning. Appetite remains fair.      Allergies  Allergen Reactions  . Morphine And Related Hives and Rash    Pt states he is able to tolerate Dilaudid with no reactions.   Current Facility-Administered Medications  Medication Dose Route Frequency Provider Last Rate Last Dose  . 0.45 % sodium chloride infusion   Intravenous Continuous Lizbeth Bark, FNP 100 mL/hr at 12/15/11 0525    . acetaminophen (TYLENOL) tablet 650 mg  650 mg Oral Once Lizbeth Bark, FNP      . albuterol (PROVENTIL HFA;VENTOLIN HFA) 108 (90 BASE) MCG/ACT inhaler 2 puff  2 puff Inhalation Q4H PRN Lizbeth Bark, FNP      . deferoxamine (DESFERAL) 2,000 mg in dextrose 5 % 500 mL infusion  2,000 mg Intravenous QHS Lizbeth Bark, FNP 125 mL/hr at 12/14/11 2215 2,000 mg at 12/14/11 2215  . diphenhydrAMINE (BENADRYL) capsule 25-50 mg  25-50 mg Oral Q4H PRN Gwenyth Bender, MD       Or  . diphenhydrAMINE (BENADRYL) injection 12.5-25 mg  12.5-25 mg Intravenous Q4H PRN Gwenyth Bender, MD   25 mg at 12/15/11 0746  . [COMPLETED] diphenhydrAMINE (BENADRYL) injection 25 mg  25 mg Intravenous Once Lizbeth Bark, FNP   25 mg at 12/14/11 1801  . enoxaparin (LOVENOX) injection 40 mg  40 mg Subcutaneous Daily Gwenyth Bender, MD   40 mg at 12/15/11 1015  . folic acid (FOLVITE) tablet 1 mg  1 mg Oral Daily Gwenyth Bender, MD   1 mg at 12/15/11 1015  . HYDROmorphone (DILAUDID) injection 2-4 mg  2-4 mg Intravenous Q2H PRN Gwenyth Bender, MD   4 mg at 12/15/11 1014  .  HYDROmorphone (DILAUDID) injection 4 mg  4 mg Intramuscular Q3H PRN Lizbeth Bark, FNP   4 mg at 12/13/11 0934  . HYDROmorphone (DILAUDID) tablet 4 mg  4 mg Oral Q4H PRN Gwenyth Bender, MD   4 mg at 12/12/11 0926  . hydroxyurea (HYDREA) capsule 500 mg  500 mg Oral BID Gwenyth Bender, MD   500 mg at 12/15/11 1015  . lactulose (CHRONULAC) 10 GM/15ML solution 20 g  20 g Oral Daily PRN Lizbeth Bark, FNP      . lisinopril (PRINIVIL,ZESTRIL) tablet 10 mg  10 mg Oral Q2200 Lizbeth Bark, FNP   10 mg at 12/14/11 2216  . moxifloxacin (AVELOX) tablet 400 mg  400 mg Oral Q24H Lizbeth Bark, FNP   400 mg at 12/13/11 1718  . OLANZapine (ZYPREXA) tablet 10 mg  10 mg Oral QHS Gwenyth Bender, MD   10 mg at 12/14/11 2216  . ondansetron (ZOFRAN) tablet 4 mg  4 mg Oral Q4H PRN Gwenyth Bender, MD       Or  . ondansetron [facility]) injection 4 mg  4 mg Intravenous Q4H PRN Gwenyth Bender, MD   4 mg at 12/15/11 0746  . OxyCODONE (OXYCONTIN) 12 hr tablet 80 mg  80 mg  Oral Q12H Gwenyth Bender, MD   80 mg at 12/15/11 1015  . potassium chloride SA (K-DUR,KLOR-CON) CR tablet 40 mEq  40 mEq Oral BID Lizbeth Bark, FNP   40 mEq at 12/15/11 1015  . promethazine (PHENERGAN) injection 25 mg  25 mg Intravenous Q4H PRN Lizbeth Bark, FNP   25 mg at 12/15/11 1015  . zolpidem (AMBIEN) tablet 10 mg  10 mg Oral QHS PRN Gwenyth Bender, MD   10 mg at 12/14/11 2216  . [DISCONTINUED] moxifloxacin (AVELOX) IVPB 400 mg  400 mg Intravenous Q24H Gwen Her, PHARMD        Objective: Blood pressure 106/76, pulse 86, temperature 98.3 F (36.8 C), temperature source Oral, resp. rate 15, height 6' (1.829 m), weight 78.608 kg (173 lb 4.8 oz), SpO2 99.00%.  General Appearance: Alert and oriented, cooperative, well developed, well nourished, mild distress  Head: Normocephalic, atraumatic  Eyes: PERRL, slight strabismus, mild icteric sclera  Back: Symmetric, no CVA tenderness Resp: even and nonlabored, diminished bilateral bases, no vocal fremitus; no wheezes/rales/rhonchi   Cardio: S1, S2 regular, no murmurs/clicks/rubs/gallops  GI: Soft, ND, active bowel sounds, no masses, no organomegaly appreciated  Male Genitalia: Deferred, the patient denies priapism  Extremities/MSK: Extremities normal, atraumatic, no cyanosis or edema, some right hip, right thigh, right knee, and inner lower leg tenderness with negative Homans; R upper arm PICC  Pulses: 2+ and symmetric  Skin: Skin color, texture, turgor normal, no rashes or lesions, numerous tattoos  Neurologic: Grossly  intact, no focal deficits  Psych: Appropriate affect   Lab results: Results for orders placed during the hospital encounter of 12/10/11 (from the past 48 hour(s))  COMPREHENSIVE METABOLIC PANEL     Status: Abnormal   Collection Time   12/14/11  5:00 AM      Component Value Range Comment   Sodium 137  135 - 145 mEq/L    Potassium 4.4  3.5 - 5.1 mEq/L    Chloride 107  96 - 112 mEq/L    CO2 23  19 - 32 mEq/L    Glucose, Bld 103 (*) 70 - 99 mg/dL    BUN 6  6 - 23 mg/dL    Creatinine, Ser 6.96  0.50 - 1.35 mg/dL    Calcium 8.4  8.4 - 29.5 mg/dL    Total Protein 7.3  6.0 - 8.3 g/dL    Albumin 3.4 (*) 3.5 - 5.2 g/dL    AST 24  0 - 37 U/L    ALT 19  0 - 53 U/L    Alkaline Phosphatase 89  39 - 117 U/L    Total Bilirubin 2.3 (*) 0.3 - 1.2 mg/dL    GFR calc non Af Amer >90  >90 mL/min    GFR calc Af Amer >90  >90 mL/min   CBC WITH DIFFERENTIAL     Status: Abnormal   Collection Time   12/14/11  5:00 AM      Component Value Range Comment   WBC 15.1 (*) 4.0 - 10.5 K/uL WHITE COUNT CONFIRMED ON SMEAR   RBC 2.37 (*) 4.22 - 5.81 MIL/uL    Hemoglobin 7.0 (*) 13.0 - 17.0 g/dL    HCT 28.4 (*) 13.2 - 52.0 %    MCV 84.8  78.0 - 100.0 fL    MCH 29.5  26.0 - 34.0 pg    MCHC 34.8  30.0 - 36.0 g/dL    RDW 44.0 (*) 10.2 - 15.5 %  Platelets 425 (*) 150 - 400 K/uL    Neutrophils Relative 58  43 - 77 %    Lymphocytes Relative 20  12 - 46 %    Monocytes Relative 13 (*) 3 - 12 %    Eosinophils Relative 8 (*)  0 - 5 %    Basophils Relative 1  0 - 1 %    Neutro Abs 8.7 (*) 1.7 - 7.7 K/uL    Lymphs Abs 3.0  0.7 - 4.0 K/uL    Monocytes Absolute 2.0 (*) 0.1 - 1.0 K/uL    Eosinophils Absolute 1.2 (*) 0.0 - 0.7 K/uL    Basophils Absolute 0.2 (*) 0.0 - 0.1 K/uL    RBC Morphology TARGET CELLS      WBC Morphology VACUOLATED NEUTROPHILS   HYPERSEGMENTED NEUT  PREPARE RBC (CROSSMATCH)     Status: Normal   Collection Time   12/14/11  1:00 PM      Component Value Range Comment   Order Confirmation ORDER PROCESSED BY BLOOD BANK     TYPE AND SCREEN     Status: Normal (Preliminary result)   Collection Time   12/14/11  2:35 PM      Component Value Range Comment   ABO/RH(D) O POS      Antibody Screen NEG      Sample Expiration 12/17/2011      Unit Number [id number]      Blood Component Type RED CELLS,LR      Unit division 00      Status of Unit ALLOCATED      Donor AG Type        Value: NEGATIVE FOR C ANTIGEN NEGATIVE FOR E ANTIGEN NEGATIVE FOR KELL ANTIGEN   Transfusion Status OK TO TRANSFUSE      Crossmatch Result COMPATIBLE      Unit Number [id number]      Blood Component Type RED CELLS,LR      Unit division 00      Status of Unit ALLOCATED      Donor AG Type        Value: NEGATIVE FOR C ANTIGEN NEGATIVE FOR E ANTIGEN NEGATIVE FOR KELL ANTIGEN   Transfusion Status OK TO TRANSFUSE      Crossmatch Result COMPATIBLE     COMPREHENSIVE METABOLIC PANEL     Status: Abnormal   Collection Time   12/15/11  5:34 AM      Component Value Range Comment   Sodium 135  135 - 145 mEq/L    Potassium 4.5  3.5 - 5.1 mEq/L    Chloride 105  96 - 112 mEq/L    CO2 20  19 - 32 mEq/L    Glucose, Bld 110 (*) 70 - 99 mg/dL    BUN 6  6 - 23 mg/dL    Creatinine, Ser 6.29  0.50 - 1.35 mg/dL    Calcium 8.9  8.4 - 52.8 mg/dL    Total Protein 7.5  6.0 - 8.3 g/dL    Albumin 3.6  3.5 - 5.2 g/dL    AST 28  0 - 37 U/L    ALT 19  0 - 53 U/L    Alkaline Phosphatase 92  39 - 117 U/L    Total Bilirubin 2.6 (*) 0.3 - 1.2  mg/dL    GFR calc non Af Amer >90  >90 mL/min    GFR calc Af Amer >90  >90 mL/min   CBC WITH DIFFERENTIAL     Status: Abnormal  Collection Time   12/15/11  5:34 AM      Component Value Range Comment   WBC 15.6 (*) 4.0 - 10.5 K/uL    RBC 2.39 (*) 4.22 - 5.81 MIL/uL    Hemoglobin 7.1 (*) 13.0 - 17.0 g/dL    HCT 46.9 (*) 62.9 - 52.0 %    MCV 85.4  78.0 - 100.0 fL    MCH 29.7  26.0 - 34.0 pg    MCHC 34.8  30.0 - 36.0 g/dL    RDW 52.8 (*) 41.3 - 15.5 %    Platelets 445 (*) 150 - 400 K/uL    Neutrophils Relative 49  43 - 77 %    Lymphocytes Relative 27  12 - 46 %    Monocytes Relative 14 (*) 3 - 12 %    Eosinophils Relative 9 (*) 0 - 5 %    Basophils Relative 1  0 - 1 %    Neutro Abs 7.6  1.7 - 7.7 K/uL    Lymphs Abs 4.2 (*) 0.7 - 4.0 K/uL    Monocytes Absolute 2.2 (*) 0.1 - 1.0 K/uL    Eosinophils Absolute 1.4 (*) 0.0 - 0.7 K/uL    Basophils Absolute 0.2 (*) 0.0 - 0.1 K/uL    RBC Morphology POLYCHROMASIA PRESENT   TARGET CELLS    Medications:    . sodium chloride 100 mL/hr at 12/15/11 0525      . acetaminophen  650 mg Oral Once  . deferoxamine (DESFERAL) IV  2,000 mg Intravenous QHS  . [COMPLETED] diphenhydrAMINE  25 mg Intravenous Once  . enoxaparin (LOVENOX) injection  40 mg Subcutaneous Daily  . folic acid  1 mg Oral Daily  . hydroxyurea  500 mg Oral BID  . lisinopril  10 mg Oral Q2200  . moxifloxacin  400 mg Oral Q24H  . OLANZapine  10 mg Oral QHS  . OxyCODONE  80 mg Oral Q12H  . potassium chloride  40 mEq Oral BID  . [DISCONTINUED] moxifloxacin  400 mg Intravenous Q24H    I  have reviewed scheduled and prn medications.   Studies/Results: Ir Fluoro Guide Cv Line Right  12/13/2011  *RADIOLOGY REPORT*  Clinical Data: Sickle cell crisis, poor venous access.  Request is made for central venous access for fluids and medications.  PICC LINE PLACEMENT WITH ULTRASOUND AND FLUOROSCOPIC  GUIDANCE  Fluoroscopy Time: 0.3 minutes.  The right arm was prepped with  chlorhexidine, draped in the usual sterile fashion using maximum barrier technique (cap and mask, sterile gown, sterile gloves, large sterile sheet, hand hygiene and cutaneous antisepsis) and infiltrated locally with 1% Lidocaine.  Ultrasound demonstrated patency of the right brachial vein, and this was documented with an image.  Under real-time ultrasound guidance, this vein was accessed with a 21 gauge micropuncture needle and image documentation was performed.  The needle was exchanged over a guidewire for a peel-away sheath through which a five [place] double lumen PICC trimmed to 42 cm was advanced, positioned with its tip at the lower SVC/right atrial junction. Fluoroscopy during the procedure and fluoro spot radiograph confirms appropriate catheter position.  The catheter was flushed, secured to the skin with Prolene sutures, and covered with a sterile dressing.  Complications:  None  IMPRESSION: Successful right arm PICC line placement with ultrasound and fluoroscopic guidance.  The catheter is ready for use.  Read by: Jeananne Rama, P.A.-C   Original Report Authenticated By: Richarda Overlie, M.D.    Ir US Guide Vasc  Access Right  12/13/2011  *RADIOLOGY REPORT*  Clinical Data: Sickle cell crisis, poor venous access.  Request is made for central venous access for fluids and medications.  PICC LINE PLACEMENT WITH ULTRASOUND AND FLUOROSCOPIC  GUIDANCE  Fluoroscopy Time: 0.3 minutes.  The right arm was prepped with chlorhexidine, draped in the usual sterile fashion using maximum barrier technique (cap and mask, sterile gown, sterile gloves, large sterile sheet, hand hygiene and cutaneous antisepsis) and infiltrated locally with 1% Lidocaine.  Ultrasound demonstrated patency of the right brachial vein, and this was documented with an image.  Under real-time ultrasound guidance, this vein was accessed with a 21 gauge micropuncture needle and image documentation was performed.  The needle was exchanged over a  guidewire for a peel-away sheath through which a five [place] double lumen PICC trimmed to 42 cm was advanced, positioned with its tip at the lower SVC/right atrial junction. Fluoroscopy during the procedure and fluoro spot radiograph confirms appropriate catheter position.  The catheter was flushed, secured to the skin with Prolene sutures, and covered with a sterile dressing.  Complications:  None  IMPRESSION: Successful right arm PICC line placement with ultrasound and fluoroscopic guidance.  The catheter is ready for use.  Read by: Jeananne Rama, P.A.-C   Original Report Authenticated By: Richarda Overlie, M.D.     Patient Active Problem List  Diagnosis  . Sickle cell pain crisis  . Leukocytosis  . Chronic pain  . Hypokalemia  . Metabolic acidosis  . Thrombocytosis  . Hemochromatosis  . Hypomagnesemia  . Acute chest syndrome due to sickle cell crisis  . Diarrhea  . Elevated brain natriuretic peptide (BNP) level  . Bronchitis  . Sickle cell anemia  . Acute embolism and thrombosis of right internal jugular vein  . Avascular necrosis  . Metabolic encephalopathy  . Increased ammonia level  . Hemoglobin S-S disease  . Sickle cell anemia with crisis  . Secondary hemochromatosis  . Sickle cell hemolytic anemia  . Elevated bilirubin    Assessment/Plan:  1) Sickle Cell Crisis- resolving with T. Bili  2.6 this am (trending down from 3.6 on adm); continue gentle IV hydration, anti-emetic prn, anti-pruretic prn; and bowel management regimen (added Lactulose prn); most recent Hgb electrophoresis on 11/07/11 with Hgb A 23.4, Hgb F 5.5%, and Hgb S 67.7%; on Hydrea and folic Acid daily; continue Lovenox DVT; follow  2) Acute on Chronic Pain- continue Dilaudid IV prn severe pain and long-acting Oxycontin 80 mg BID; follow  3) Left Side CP/DOE/history ACS- resolved; continue to encourage Incentive Spirometry and prn Albuterol MDI; follow  4) Anemia of SCD- Hgb 7.1 this am; trending down from 7.9 on adm  [facility]; suspect secondary to active hemolysis, continue to follow closely and transfuse 1 unit if <7.0  5) Leukocytosis with Mild Left Shift- WBC 15.6 this am and continues to trend down; work-up NTD; remains afebrile (97.9); suspect secondary to inflammatory response with SCD; change IV Avelox to po to complete 7 day course; follow  6) Secondary Hemochromatosis- most recent Ferritin level 2022; continue IV Desferal while inpatient; resume Exjade 500mg  po daily at discharge; follow  7) Hypokalemia- K level 4.5 this am; appetite improved; resume home dose KCl po to BID; follow closely  8) Insomnia- improved with Ambien 10mg  po qhs prn; follow  9) HTN- controlled on Lisinopril 10mg  po daily for cardiorenal protection; continue parameters (hold for SBP <110); follow closely 10) Left hip pain-  History AVN and right hip replacement; 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 Avel Peace on January 03, 2012 at 9:20 am to evaluate for Port-A-Cath placement. 13) Disposition- discharge when able to manage his pain at home.  The plan of care for this patient has been discussed with Dr. Renea Ee, Milia Warth S 12/15/2011 11:04 AM  LOS: 5 days

## 2011-12-16 LAB — CBC WITH DIFFERENTIAL/PLATELET
Basophils Relative: 1 % (ref 0–1)
Eosinophils Absolute: 1.1 10*3/uL — ABNORMAL HIGH (ref 0.0–0.7)
Lymphs Abs: 3 10*3/uL (ref 0.7–4.0)
MCH: 29 pg (ref 26.0–34.0)
Neutro Abs: 6.6 10*3/uL (ref 1.7–7.7)
Neutrophils Relative %: 52 % (ref 43–77)
Platelets: 424 10*3/uL — ABNORMAL HIGH (ref 150–400)
RBC: 2.14 MIL/uL — ABNORMAL LOW (ref 4.22–5.81)

## 2011-12-16 LAB — COMPREHENSIVE METABOLIC PANEL
AST: 35 U/L (ref 0–37)
BUN: 7 mg/dL (ref 6–23)
CO2: 23 mEq/L (ref 19–32)
Chloride: 105 mEq/L (ref 96–112)
Creatinine, Ser: 0.67 mg/dL (ref 0.50–1.35)
GFR calc Af Amer: >90 mL/min (ref >90)
GFR calc non Af Amer: >90 mL/min (ref >90)
Glucose, Bld: 104 mg/dL — ABNORMAL HIGH (ref 70–99)
Total Bilirubin: 2.6 mg/dL — ABNORMAL HIGH (ref 0.3–1.2)

## 2011-12-16 LAB — PREPARE RBC (CROSSMATCH)

## 2011-12-16 MED ORDER — HYDROMORPHONE HCL PF 2 MG/ML IJ SOLN
2.0000 mg | INTRAMUSCULAR | Status: DC | PRN
  Administered 2011-12-16 – 2011-12-19 (×22): 4 mg via INTRAVENOUS
  Administered 2011-12-19: 2 mg via INTRAVENOUS
  Administered 2011-12-19: 4 mg via INTRAVENOUS
  Filled 2011-12-16 (×2): qty 2
  Filled 2011-12-16: qty 1
  Filled 2011-12-16 (×17): qty 2
  Filled 2011-12-16: qty 1
  Filled 2011-12-16 (×3): qty 2

## 2011-12-16 NOTE — Progress Notes (Signed)
Subjective: [name] was seen on rounds today. He continues to have pain to his left hip that continues to radiate to the thigh but no longer to the knee and remains without associated paresthesia. He also continues to have pain in his left side and ribs now described as intermittent sharp and remains without associated SOB. He reports slight improvement in his pain as he now rates it all 5-6/10 and he feels that he will be able to manage his pain at home by Sunday.  He also continues to deny fever, chills, nausea, vomiting, abdominal pain, constipation, headache, dizziness, dysuria, hematuria, urinary urgency, frequency, priapism, or changes to his bowel or bladder functioning, but reports an occasional yellowish green productive cough since last night. Appetite remains fair.      Allergies  Allergen Reactions  . Morphine And Related Hives and Rash    Pt states he is able to tolerate Dilaudid with no reactions.   Current Facility-Administered Medications  Medication Dose Route Frequency Provider Last Rate Last Dose  . 0.45 % sodium chloride infusion   Intravenous Continuous Lizbeth Bark, FNP 100 mL/hr at 12/16/11 0001 100 mL/hr at 12/16/11 0001  . acetaminophen (TYLENOL) tablet 650 mg  650 mg Oral Once Lizbeth Bark, FNP      . albuterol (PROVENTIL HFA;VENTOLIN HFA) 108 (90 BASE) MCG/ACT inhaler 2 puff  2 puff Inhalation Q4H PRN Lizbeth Bark, FNP      . deferoxamine (DESFERAL) 2,000 mg in dextrose 5 % 500 mL infusion  2,000 mg Intravenous QHS Lizbeth Bark, FNP 125 mL/hr at 12/15/11 2130 2,000 mg at 12/15/11 2130  . diphenhydrAMINE (BENADRYL) capsule 25-50 mg  25-50 mg Oral Q4H PRN Gwenyth Bender, MD       Or  . diphenhydrAMINE (BENADRYL) injection 12.5-25 mg  12.5-25 mg Intravenous Q4H PRN Gwenyth Bender, MD   25 mg at 12/16/11 0601  . enoxaparin (LOVENOX) injection 40 mg  40 mg Subcutaneous Daily Gwenyth Bender, MD   40 mg at 12/15/11 1015  . folic acid (FOLVITE) tablet 1 mg  1 mg Oral Daily Gwenyth Bender,  MD   1 mg at 12/15/11 1015  . HYDROmorphone (DILAUDID) injection 2-4 mg  2-4 mg Intravenous Q2H PRN Gwenyth Bender, MD   4 mg at 12/16/11 0806  . HYDROmorphone (DILAUDID) injection 4 mg  4 mg Intramuscular Q3H PRN Lizbeth Bark, FNP   4 mg at 12/13/11 0934  . HYDROmorphone (DILAUDID) tablet 4 mg  4 mg Oral Q4H PRN Gwenyth Bender, MD   4 mg at 12/12/11 0926  . hydroxyurea (HYDREA) capsule 500 mg  500 mg Oral BID Gwenyth Bender, MD   500 mg at 12/15/11 2153  . lactulose (CHRONULAC) 10 GM/15ML solution 20 g  20 g Oral Daily PRN Lizbeth Bark, FNP   20 g at 12/15/11 2241  . lisinopril (PRINIVIL,ZESTRIL) tablet 10 mg  10 mg Oral Q2200 Lizbeth Bark, FNP   10 mg at 12/15/11 2130  . moxifloxacin (AVELOX) tablet 400 mg  400 mg Oral Q24H Lizbeth Bark, FNP   400 mg at 12/15/11 1725  . OLANZapine (ZYPREXA) tablet 10 mg  10 mg Oral QHS Gwenyth Bender, MD   10 mg at 12/15/11 2131  . ondansetron (ZOFRAN) tablet 4 mg  4 mg Oral Q4H PRN Gwenyth Bender, MD       Or  . ondansetron [facility]) injection 4 mg  4 mg  Intravenous Q4H PRN Gwenyth Bender, MD   4 mg at 12/16/11 0600  . OxyCODONE (OXYCONTIN) 12 hr tablet 80 mg  80 mg Oral Q12H Gwenyth Bender, MD   80 mg at 12/15/11 2130  . potassium chloride SA (K-DUR,KLOR-CON) CR tablet 20 mEq  20 mEq Oral BID Lizbeth Bark, FNP   20 mEq at 12/15/11 2130  . promethazine (PHENERGAN) injection 25 mg  25 mg Intravenous Q4H PRN Lizbeth Bark, FNP   25 mg at 12/16/11 0806  . zolpidem (AMBIEN) tablet 10 mg  10 mg Oral QHS PRN Gwenyth Bender, MD   10 mg at 12/15/11 2241  . [DISCONTINUED] potassium chloride SA (K-DUR,KLOR-CON) CR tablet 40 mEq  40 mEq Oral BID Lizbeth Bark, FNP   40 mEq at 12/15/11 1015    Objective: Blood pressure 102/65, pulse 90, temperature 98 F (36.7 C), temperature source Oral, resp. rate 16, height 6' (1.829 m), weight 80.105 kg (176 lb 9.6 oz), SpO2 99.00%.  General Appearance: Alert, Ox3, cooperative, WD, WN, mild distress  Head: Normocephalic, atraumatic  Eyes: PERRL, slight  strabismus, mild icteric sclera  Back: Symmetric, no CVA tenderness Resp: even and nonlabored, diminished bilateral bases, no vocal fremitus; no wheezes/rales/rhonchi  Cardio: S1, S2 regular, no murmurs/clicks/rubs/gallops  GI: Soft, ND, active bowel sounds, no masses, no organomegaly appreciated  Male Genitalia: Deferred, the patient denies priapism  Extremities/MSK: Extremities normal, atraumatic, no cyanosis or edema, some right hip, and right thigh tenderness with negative Homans; R upper arm PICC  Pulses: 2+ and symmetric  Skin: Skin intact   Neurologic: Grossly  intact, no focal deficits  Psych: Appropriate affect   Lab results: Results for orders placed during the hospital encounter of 12/10/11 (from the past 48 hour(s))  PREPARE RBC (CROSSMATCH)     Status: Normal   Collection Time   12/14/11  1:00 PM      Component Value Range Comment   Order Confirmation ORDER PROCESSED BY BLOOD BANK     TYPE AND SCREEN     Status: Normal (Preliminary result)   Collection Time   12/14/11  2:35 PM      Component Value Range Comment   ABO/RH(D) O POS      Antibody Screen NEG      Sample Expiration 12/17/2011      Unit Number [id number]      Blood Component Type RED CELLS,LR      Unit division 00      Status of Unit ALLOCATED      Donor AG Type        Value: NEGATIVE FOR C ANTIGEN NEGATIVE FOR E ANTIGEN NEGATIVE FOR KELL ANTIGEN   Transfusion Status OK TO TRANSFUSE      Crossmatch Result COMPATIBLE      Unit Number [id number]      Blood Component Type RED CELLS,LR      Unit division 00      Status of Unit ISSUED      Donor AG Type        Value: NEGATIVE FOR C ANTIGEN NEGATIVE FOR E ANTIGEN NEGATIVE FOR KELL ANTIGEN   Transfusion Status OK TO TRANSFUSE      Crossmatch Result COMPATIBLE     COMPREHENSIVE METABOLIC PANEL     Status: Abnormal   Collection Time   12/15/11  5:34 AM      Component Value Range Comment   Sodium 135  135 - 145 mEq/L    Potassium 4.5  3.5 - 5.1 mEq/L     Chloride 105  96 - 112 mEq/L    CO2 20  19 - 32 mEq/L    Glucose, Bld 110 (*) 70 - 99 mg/dL    BUN 6  6 - 23 mg/dL    Creatinine, Ser 4.54  0.50 - 1.35 mg/dL    Calcium 8.9  8.4 - 09.8 mg/dL    Total Protein 7.5  6.0 - 8.3 g/dL    Albumin 3.6  3.5 - 5.2 g/dL    AST 28  0 - 37 U/L    ALT 19  0 - 53 U/L    Alkaline Phosphatase 92  39 - 117 U/L    Total Bilirubin 2.6 (*) 0.3 - 1.2 mg/dL    GFR calc non Af Amer >90  >90 mL/min    GFR calc Af Amer >90  >90 mL/min   CBC WITH DIFFERENTIAL     Status: Abnormal   Collection Time   12/15/11  5:34 AM      Component Value Range Comment   WBC 15.6 (*) 4.0 - 10.5 K/uL    RBC 2.39 (*) 4.22 - 5.81 MIL/uL    Hemoglobin 7.1 (*) 13.0 - 17.0 g/dL    HCT 11.9 (*) 14.7 - 52.0 %    MCV 85.4  78.0 - 100.0 fL    MCH 29.7  26.0 - 34.0 pg    MCHC 34.8  30.0 - 36.0 g/dL    RDW 82.9 (*) 56.2 - 15.5 %    Platelets 445 (*) 150 - 400 K/uL    Neutrophils Relative 49  43 - 77 %    Lymphocytes Relative 27  12 - 46 %    Monocytes Relative 14 (*) 3 - 12 %    Eosinophils Relative 9 (*) 0 - 5 %    Basophils Relative 1  0 - 1 %    Neutro Abs 7.6  1.7 - 7.7 K/uL    Lymphs Abs 4.2 (*) 0.7 - 4.0 K/uL    Monocytes Absolute 2.2 (*) 0.1 - 1.0 K/uL    Eosinophils Absolute 1.4 (*) 0.0 - 0.7 K/uL    Basophils Absolute 0.2 (*) 0.0 - 0.1 K/uL    RBC Morphology POLYCHROMASIA PRESENT   TARGET CELLS  COMPREHENSIVE METABOLIC PANEL     Status: Abnormal   Collection Time   12/16/11  5:30 AM      Component Value Range Comment   Sodium 135  135 - 145 mEq/L    Potassium 4.3  3.5 - 5.1 mEq/L    Chloride 105  96 - 112 mEq/L    CO2 23  19 - 32 mEq/L    Glucose, Bld 104 (*) 70 - 99 mg/dL    BUN 7  6 - 23 mg/dL    Creatinine, Ser 1.30  0.50 - 1.35 mg/dL    Calcium 8.4  8.4 - 86.5 mg/dL    Total Protein 7.2  6.0 - 8.3 g/dL    Albumin 3.5  3.5 - 5.2 g/dL    AST 35  0 - 37 U/L    ALT 18  0 - 53 U/L    Alkaline Phosphatase 92  39 - 117 U/L    Total Bilirubin 2.6 (*) 0.3 - 1.2  mg/dL    GFR calc non Af Amer >90  >90 mL/min    GFR calc Af Amer >90  >90 mL/min   CBC WITH DIFFERENTIAL     Status:  Abnormal   Collection Time   12/16/11  5:30 AM      Component Value Range Comment   WBC 12.8 (*) 4.0 - 10.5 K/uL    RBC 2.14 (*) 4.22 - 5.81 MIL/uL    Hemoglobin 6.2 (*) 13.0 - 17.0 g/dL    HCT 96.0 (*) 45.4 - 52.0 %    MCV 85.5  78.0 - 100.0 fL    MCH 29.0  26.0 - 34.0 pg    MCHC 33.9  30.0 - 36.0 g/dL    RDW 09.8 (*) 11.9 - 15.5 %    Platelets 424 (*) 150 - 400 K/uL    Neutrophils Relative 52  43 - 77 %    Neutro Abs 6.6  1.7 - 7.7 K/uL    Lymphocytes Relative 24  12 - 46 %    Lymphs Abs 3.0  0.7 - 4.0 K/uL    Monocytes Relative 15 (*) 3 - 12 %    Monocytes Absolute 1.9 (*) 0.1 - 1.0 K/uL    Eosinophils Relative 9 (*) 0 - 5 %    Eosinophils Absolute 1.1 (*) 0.0 - 0.7 K/uL    Basophils Relative 1  0 - 1 %    Basophils Absolute 0.2 (*) 0.0 - 0.1 K/uL   PREPARE RBC (CROSSMATCH)     Status: Normal   Collection Time   12/16/11  7:30 AM      Component Value Range Comment   Order Confirmation ORDER PROCESSED BY BLOOD BANK       Medications:    . sodium chloride 100 mL/hr (12/16/11 0001)      . acetaminophen  650 mg Oral Once  . deferoxamine (DESFERAL) IV  2,000 mg Intravenous QHS  . enoxaparin (LOVENOX) injection  40 mg Subcutaneous Daily  . folic acid  1 mg Oral Daily  . hydroxyurea  500 mg Oral BID  . lisinopril  10 mg Oral Q2200  . moxifloxacin  400 mg Oral Q24H  . OLANZapine  10 mg Oral QHS  . OxyCODONE  80 mg Oral Q12H  . potassium chloride  20 mEq Oral BID  . [DISCONTINUED] potassium chloride  40 mEq Oral BID    I  have reviewed scheduled and prn medications.   Studies/Results: Dg Hip Complete Left  12/15/2011  *RADIOLOGY REPORT*  Clinical Data: Left hip pain, sickle cell disease  LEFT HIP - COMPLETE 2+ VIEW  Comparison: None.  Findings: A view of the pelvis and two views of the left hip show no acute abnormality.  The left hip joint space is  well preserved. The rami are intact.  A right total hip replacement is noted.  IMPRESSION: Negative left hip.  Right total hip replacement.   Original Report Authenticated By: Dwyane Dee, M.D.     Patient Active Problem List  Diagnosis  . Sickle cell pain crisis  . Leukocytosis  . Chronic pain  . Hypokalemia  . Metabolic acidosis  . Thrombocytosis  . Hemochromatosis  . Hypomagnesemia  . Acute chest syndrome due to sickle cell crisis  . Diarrhea  . Elevated brain natriuretic peptide (BNP) level  . Bronchitis  . Sickle cell anemia  . Acute embolism and thrombosis of right internal jugular vein  . Avascular necrosis  . Metabolic encephalopathy  . Increased ammonia level  . Hemoglobin S-S disease  . Sickle cell anemia with crisis  . Secondary hemochromatosis  . Sickle cell hemolytic anemia  . Elevated bilirubin    Assessment/Plan:  1)  Sickle Cell Crisis- slowly resolving with T. Bili still 2.6 this am (3.6 on adm); will continue gentle IV hydration, anti-emetic prn, anti-pruretic prn; and bowel management regimen (added Lactulose prn); most recent Hgb electrophoresis on 11/07/11 with Hgb A 23.4, Hgb F 5.5%, and Hgb S 67.7%; on Hydrea and folic Acid daily; repeat Hgb electrophoresis in am; continue Lovenox DVT; follow  2) Acute on Chronic Pain- continue Dilaudid IV prn severe pain, long-acting Oxycontin 80 mg BID; home dose Dilaudid 4mg  po with recommendation to take po as much as possible noting plans for discharge; follow  3) Left Side CP/DOE/history ACS- DOE resolved, but still has left chest discomfort now with occasional productive cough; neg CXR adm; completing antibiotics; encouraged to continue Incentive Spirometry and prn Albuterol MDI; follow  4) Anemia of SCD- Hgb 6.2 this am; s/p transfusion 1 unit PRBC's this am; if Hgb remains < 7.0 tomorrow, transfuse another unit; suspect secondary to active hemolysis; follow 5) Leukocytosis- WBC 12.8 this am and continues to trend down;  work-up NTD; remains afebrile; suspect secondary to inflammatory response with SCD; complete 7 day course Avelox; follow  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 Avel Peace on January 03, 2012 at 9:20 am to evaluate for Port-A-Cath placement. 13) Disposition- discharge Sunday if remains able to manage his pain at per patient request.  The plan of care for this patient has been discussed with Dr. Gates Rigg, Christel Bai S 12/16/2011 9:22 AM  LOS: 6 days

## 2011-12-16 NOTE — Progress Notes (Signed)
PATIENT'S HGB 6.2 THIS MORNING, DR. DEAN CALLED, AWAITING RETURN CALL. Arlington Calix, RN

## 2011-12-17 DIAGNOSIS — D5700 Hb-SS disease with crisis, unspecified: Secondary | ICD-10-CM

## 2011-12-17 DIAGNOSIS — D571 Sickle-cell disease without crisis: Secondary | ICD-10-CM

## 2011-12-17 LAB — COMPREHENSIVE METABOLIC PANEL
ALT: 26 U/L (ref 0–53)
AST: 50 U/L — ABNORMAL HIGH (ref 0–37)
CO2: 23 mEq/L (ref 19–32)
Calcium: 8.7 mg/dL (ref 8.4–10.5)
Sodium: 134 mEq/L — ABNORMAL LOW (ref 135–145)
Total Protein: 7.3 g/dL (ref 6.0–8.3)

## 2011-12-17 LAB — CBC WITH DIFFERENTIAL/PLATELET
Basophils Relative: 2 % — ABNORMAL HIGH (ref 0–1)
Eosinophils Absolute: 0.8 10*3/uL — ABNORMAL HIGH (ref 0.0–0.7)
HCT: 20.8 % — ABNORMAL LOW (ref 39.0–52.0)
Hemoglobin: 7.2 g/dL — ABNORMAL LOW (ref 13.0–17.0)
MCH: 29 pg (ref 26.0–34.0)
MCHC: 34.6 g/dL (ref 30.0–36.0)
Monocytes Absolute: 2.2 10*3/uL — ABNORMAL HIGH (ref 0.1–1.0)
Monocytes Relative: 18 % — ABNORMAL HIGH (ref 3–12)

## 2011-12-17 NOTE — Progress Notes (Signed)
Subjective: Pt states that he feels that he is approaching a  point where he will be able to manage is pain at home. He rates his pain as 4-5/10 today. We have discussed  tapering of pain medication so as to prevent withdrawal but pt is very resistant to tapering medication. He states that this has not been done in the past and that he has not had withdrawal symptoms. Objective: Filed Vitals:   12/17/11 0200 12/17/11 0615 12/17/11 0925 12/17/11 1355  BP: 99/70 112/68 106/60 96/60  Pulse: 68 80 74 74  Temp: 98 F (36.7 C) 98.2 F (36.8 C) 98.3 F (36.8 C) 98.6 F (37 C)  TempSrc: Oral Oral Oral Oral  Resp: 16 16 18 16   Height:      Weight:      SpO2: 97% 100% 98% 99%   Weight change:   Intake/Output Summary (Last 24 hours) at 12/17/11 1754 Last data filed at 12/17/11 1348  Gross per 24 hour  Intake   4166 ml  Output      0 ml  Net   4166 ml    General: Alert, awake, oriented x3, in no acute distress.  HEENT: /AT PEERL, EOMI Neck: Trachea midline,  no masses, no thyromegal,y no JVD, no carotid bruit OROPHARYNX:  Moist, No exudate/ erythema/lesions.  Heart: Regular rate and rhythm, without murmurs, rubs, gallops, PMI non-displaced, no heaves or thrills on palpation.  Lungs: Clear to auscultation, no wheezing or rhonchi noted. No increased vocal fremitus resonant to percussion  Abdomen: Soft, nontender, nondistended, positive bowel sounds, no masses no hepatosplenomegaly noted..  Neuro: No focal neurological deficits noted. Musculoskeletal: No warm swelling or erythema around joints, no spinal tenderness noted.   Lab Results:  [facility] 12/17/11 0549 12/16/11 0530  NA 134* 135  K 4.2 4.3  CL 104 105  CO2 23 23  GLUCOSE 98 104*  BUN 7 7  CREATININE 0.63 0.67  CALCIUM 8.7 8.4  MG -- --  PHOS -- --    Basename 12/17/11 0549 12/16/11 0530  AST 50* 35  ALT 26 18  ALKPHOS 100 92  BILITOT 3.0* 2.6*  PROT 7.3 7.2  ALBUMIN 3.5 3.5   No results found for this  basename: LIPASE:2,AMYLASE:2 in the last 72 hours  Basename 12/17/11 0549 12/16/11 0530  WBC 11.8* 12.8*  NEUTROABS 5.8 6.6  HGB 7.2* 6.2*  HCT 20.8* 18.3*  MCV 83.9 85.5  PLT 452* 424*   No results found for this basename: CKTOTAL:3,CKMB:3,CKMBINDEX:3,TROPONINI:3 in the last 72 hours No components found with this basename: POCBNP:3 No results found for this basename: DDIMER:2 in the last 72 hours No results found for this basename: HGBA1C:2 in the last 72 hours No results found for this basename: CHOL:2,HDL:2,LDLCALC:2,TRIG:2,CHOLHDL:2,LDLDIRECT:2 in the last 72 hours No results found for this basename: TSH,T4TOTAL,FREET3,T3FREE,THYROIDAB in the last 72 hours No results found for this basename: VITAMINB12:2,FOLATE:2,FERRITIN:2,TIBC:2,IRON:2,RETICCTPCT:2 in the last 72 hours  Micro Results: Recent Results (from the past 240 hour(s))  CULTURE, BLOOD (ROUTINE X 2)     Status: Normal (Preliminary result)   Collection Time   12/12/11  1:45 PM      Component Value Range Status Comment   Specimen Description BLOOD RIGHT HAND   Final    Special Requests BOTTLES DRAWN AEROBIC ONLY 4CC   Final    Culture  Setup Time 12/12/2011 22:23   Final    Culture     Final    Value:  BLOOD CULTURE RECEIVED NO GROWTH TO DATE CULTURE WILL BE HELD FOR 5 DAYS BEFORE ISSUING A FINAL NEGATIVE REPORT   Report Status PENDING   Incomplete   CULTURE, BLOOD (ROUTINE X 2)     Status: Normal (Preliminary result)   Collection Time   12/12/11  1:52 PM      Component Value Range Status Comment   Specimen Description BLOOD LEFT HAND   Final    Special Requests BOTTLES DRAWN AEROBIC ONLY 1CC   Final    Culture  Setup Time 12/12/2011 22:23   Final    Culture     Final    Value:        BLOOD CULTURE RECEIVED NO GROWTH TO DATE CULTURE WILL BE HELD FOR 5 DAYS BEFORE ISSUING A FINAL NEGATIVE REPORT   Report Status PENDING   Incomplete   URINE CULTURE     Status: Normal   Collection Time   12/12/11  3:22 PM       Component Value Range Status Comment   Specimen Description URINE, CLEAN CATCH   Final    Special Requests avelox   Final    Culture  Setup Time 12/13/2011 03:09   Final    Colony Count NO GROWTH   Final    Culture NO GROWTH   Final    Report Status 12/14/2011 FINAL   Final     Studies/Results: Dg Chest 2 View  12/10/2011  *RADIOLOGY REPORT*  Clinical Data: Shortness of breath  CHEST - 2 VIEW  Comparison: 11/21/2011  Findings: Cardiomediastinal silhouette is stable.  Again noted perihilar mild increase bronchial markings.  Stable bilateral mild interstitial prominence.  No segmental infiltrate or pulmonary edema.  Bony thorax is stable.  IMPRESSION:  Again noted perihilar mild increase bronchial markings.  Stable bilateral mild interstitial prominence.  No segmental infiltrate or pulmonary edema.  Bony thorax is stable.   Original Report Authenticated By: Natasha Mead, M.D.    Dg Chest 2 View  11/21/2011  *RADIOLOGY REPORT*  Clinical Data: Shortness of breath.  History of sickle cell disease.  CHEST - 2 VIEW  Comparison: 11/03/2011 and 10/02/2011.  Findings: There is stable mild cardiac enlargement.  Mediastinal contours are stable.  There is stable chronic central airway thickening without evidence of superimposed airspace disease, edema or pleural effusion.  The osseous structures appear unchanged. Telemetry leads overlie the chest.  IMPRESSION: Stable mild cardiomegaly and chronic lung disease.  No acute cardiopulmonary process.   Original Report Authenticated By: Gerrianne Scale, M.D.    Dg Hip Complete Left  12/15/2011  *RADIOLOGY REPORT*  Clinical Data: Left hip pain, sickle cell disease  LEFT HIP - COMPLETE 2+ VIEW  Comparison: None.  Findings: A view of the pelvis and two views of the left hip show no acute abnormality.  The left hip joint space is well preserved. The rami are intact.  A right total hip replacement is noted.  IMPRESSION: Negative left hip.  Right total hip replacement.    Original Report Authenticated By: Dwyane Dee, M.D.    Ct Angio Chest Pe W/cm &/or Wo Cm  11/21/2011  *RADIOLOGY REPORT*  Clinical Data: Pleuritic chest pain.  Shortness of breath.  History of sickle cell.  CT ANGIOGRAPHY CHEST  Technique:  Multidetector CT imaging of the chest using the standard protocol during bolus administration of intravenous contrast. Multiplanar reconstructed images including MIPs were obtained and reviewed to evaluate the vascular anatomy.  Contrast: 80mL OMNIPAQUE IOHEXOL 350 MG/ML SOLN  Comparison: Chest CTA 04/25/2011.  Findings: The pulmonary arteries are adequately opacified with contrast.  There is no evidence of acute pulmonary embolism.  Mild central enlargement of the pulmonary arteries is stable.  There are stable mildly prominent hilar and prevascular lymph nodes bilaterally.  There is no pleural or pericardial effusion.  Right arm PICC remains in place.  Scattered patchy opacities in both lungs are stable and most consistent with atelectasis or scarring.  There is no endobronchial lesion or confluent airspace opacity.  The visualized upper abdomen has a stable appearance with autoinfarction of the spleen.  Chronic endplate deformities throughout the thoracic spine are stable.  IMPRESSION:  1.  No evidence of acute pulmonary embolism. 2.  Stable scattered pulmonary parenchymal scarring or atelectasis. 3.  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   Original Report Authenticated By: Gerrianne Scale, M.D.    US Renal  11/28/2011  *RADIOLOGY REPORT*  Clinical Data: Sickle cell disease.  Hypertension.  RENAL/URINARY TRACT ULTRASOUND COMPLETE  Comparison:  Abdominal ultrasound 01/11/2011 and 09/10/2011.  Findings:  Right Kidney:  Measures 14.8 cm.  No stone, mass or hydronephrosis. 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  Left Kidney:  Measures  12.4 cm.  No stone, mass or hydronephrosis.  Bladder:  Bilateral ureteral jets are identified.  Urinary bladder is otherwise unremarkable.  IMPRESSION: No acute finding.  Stable compared to prior exam.   Original Report Authenticated By: Bernadene Bell. Maricela Curet, M.D.    Ir Fluoro Guide Cv Line Right  12/13/2011  *RADIOLOGY REPORT*  Clinical Data: Sickle cell crisis, poor venous access.  Request is made for central venous access for fluids and medications.  PICC LINE PLACEMENT WITH ULTRASOUND AND FLUOROSCOPIC  GUIDANCE  Fluoroscopy Time: 0.3 minutes.  The right arm was prepped with chlorhexidine, draped in the usual sterile fashion using maximum barrier technique (cap and mask, sterile gown, sterile gloves, large sterile sheet, hand hygiene and cutaneous antisepsis) and infiltrated locally with 1% Lidocaine.  Ultrasound demonstrated patency of the right brachial vein, and this was documented with an image.  Under real-time ultrasound guidance, this vein was accessed with a 21 gauge micropuncture needle and image documentation was performed.  The needle was exchanged over a guidewire for a peel-away sheath through which a five [place] double lumen PICC trimmed to 42 cm was advanced, positioned with its tip at the lower SVC/right atrial junction. Fluoroscopy during the procedure and fluoro spot radiograph confirms appropriate catheter position.  The catheter was flushed, secured to the skin with Prolene sutures, and covered with a sterile dressing.  Complications:  None  IMPRESSION: Successful right arm PICC line placement with ultrasound and fluoroscopic guidance.  The catheter is ready for use.  Read by: Jeananne Rama, P.A.-C   Original Report Authenticated By: Richarda Overlie, M.D.    Ir Fluoro Guide Cv Line Right  11/21/2011  *RADIOLOGY REPORT*  Clinical Data: Chest pain, sickle cell anemia and lack of IV access.  PICC LINE PLACEMENT WITH ULTRASOUND AND FLUOROSCOPIC GUIDANCE  Fluoroscopy Time: 0.2 minutes.  The right  arm was prepped with chlorhexidine, draped in the usual sterile fashion using maximum barrier technique (cap and mask, sterile gown, sterile gloves, large sterile sheet, hand hygiene and cutaneous antisepsis) and infiltrated locally with 1% Lidocaine.  Ultrasound demonstrated patency of the right brachial vein, and this was documented with an image.  Under real-time ultrasound  guidance, this vein was accessed with a 21 gauge micropuncture needle and image documentation was performed.  The needle was exchanged over a guidewire for a peel-away sheath through which a 5 [place] single lumen PICC trimmed to 42 cm was advanced, positioned with its tip at the lower SVC/right atrial junction.  Fluoroscopy during the procedure and fluoro spot radiograph confirms appropriate catheter position.  The catheter was flushed, secured to the skin with Prolene sutures, and covered with a sterile dressing.  Complications:  None  IMPRESSION: Successful right arm PICC line placement with ultrasound and fluoroscopic guidance.  The catheter is ready for use. A power injectable PICC line was placed.   Original Report Authenticated By: Reola Calkins, M.D.    Ir US Guide Vasc Access Right  12/13/2011  *RADIOLOGY REPORT*  Clinical Data: Sickle cell crisis, poor venous access.  Request is made for central venous access for fluids and medications.  PICC LINE PLACEMENT WITH ULTRASOUND AND FLUOROSCOPIC  GUIDANCE  Fluoroscopy Time: 0.3 minutes.  The right arm was prepped with chlorhexidine, draped in the usual sterile fashion using maximum barrier technique (cap and mask, sterile gown, sterile gloves, large sterile sheet, hand hygiene and cutaneous antisepsis) and infiltrated locally with 1% Lidocaine.  Ultrasound demonstrated patency of the right brachial vein, and this was documented with an image.  Under real-time ultrasound guidance, this vein was accessed with a 21 gauge micropuncture needle and image documentation was performed.  The  needle was exchanged over a guidewire for a peel-away sheath through which a five [place] double lumen PICC trimmed to 42 cm was advanced, positioned with its tip at the lower SVC/right atrial junction. Fluoroscopy during the procedure and fluoro spot radiograph confirms appropriate catheter position.  The catheter was flushed, secured to the skin with Prolene sutures, and covered with a sterile dressing.  Complications:  None  IMPRESSION: Successful right arm PICC line placement with ultrasound and fluoroscopic guidance.  The catheter is ready for use.  Read by: Jeananne Rama, P.A.-C   Original Report Authenticated By: Richarda Overlie, M.D.    Ir US Guide Vasc Access Right  11/21/2011  *RADIOLOGY REPORT*  Clinical Data: Chest pain, sickle cell anemia and lack of IV access.  PICC LINE PLACEMENT WITH ULTRASOUND AND FLUOROSCOPIC GUIDANCE  Fluoroscopy Time: 0.2 minutes.  The right arm was prepped with chlorhexidine, draped in the usual sterile fashion using maximum barrier technique (cap and mask, sterile gown, sterile gloves, large sterile sheet, hand hygiene and cutaneous antisepsis) and infiltrated locally with 1% Lidocaine.  Ultrasound demonstrated patency of the right brachial vein, and this was documented with an image.  Under real-time ultrasound guidance, this vein was accessed with a 21 gauge micropuncture needle and image documentation was performed.  The needle was exchanged over a guidewire for a peel-away sheath through which a 5 [place] single lumen PICC trimmed to 42 cm was advanced, positioned with its tip at the lower SVC/right atrial junction.  Fluoroscopy during the procedure and fluoro spot radiograph confirms appropriate catheter position.  The catheter was flushed, secured to the skin with Prolene sutures, and covered with a sterile dressing.  Complications:  None  IMPRESSION: Successful right arm PICC line placement with ultrasound and fluoroscopic guidance.  The catheter is ready for use. A power  injectable PICC line was placed.   Original Report Authenticated By: Reola Calkins, M.D.     Medications: I have reviewed the patient's current medications. Scheduled Meds:   . acetaminophen  650 mg Oral Once  . deferoxamine (DESFERAL) IV  2,000 mg Intravenous QHS  . enoxaparin (LOVENOX) injection  40 mg Subcutaneous Daily  . folic acid  1 mg Oral Daily  . hydroxyurea  500 mg Oral BID  . lisinopril  10 mg Oral Q2200  . moxifloxacin  400 mg Oral Q24H  . OLANZapine  10 mg Oral QHS  . OxyCODONE  80 mg Oral Q12H  . potassium chloride  20 mEq Oral BID   Continuous Infusions:   . sodium chloride 100 mL/hr at 12/17/11 1531   PRN Meds:.albuterol, diphenhydrAMINE, diphenhydrAMINE, HYDROmorphone (DILAUDID) injection, HYDROmorphone, lactulose, ondansetron (ZOFRAN) IV, ondansetron, promethazine, zolpidem Assessment/Plan: Patient Active Hospital Problem List: Sickle cell anemia with crisis (12/14/2011)   Assessment: Pt has had improvement in pain and feels that he will be able to go home in the next 24 hours.    Plan: Will continue pain medication at present dose and plan for discharge hopefully tomorrow.  Leukocytosis (12/17/2010)   Assessment: Felt to be secondary to an inflammatory state. WBC is almost normalized.   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 Pt currently on Desferal.    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  Elevated bilirubin (12/14/2011)   Assessment: Likely secondary to hemolysis.       LOS: 7 days

## 2011-12-18 DIAGNOSIS — D571 Sickle-cell disease without crisis: Secondary | ICD-10-CM

## 2011-12-18 DIAGNOSIS — D5700 Hb-SS disease with crisis, unspecified: Secondary | ICD-10-CM

## 2011-12-18 LAB — COMPREHENSIVE METABOLIC PANEL
ALT: 29 U/L (ref 0–53)
Alkaline Phosphatase: 95 U/L (ref 39–117)
CO2: 23 mEq/L (ref 19–32)
GFR calc Af Amer: >90 mL/min (ref >90)
Glucose, Bld: 110 mg/dL — ABNORMAL HIGH (ref 70–99)
Potassium: 3.9 mEq/L (ref 3.5–5.1)
Sodium: 133 mEq/L — ABNORMAL LOW (ref 135–145)
Total Protein: 7.4 g/dL (ref 6.0–8.3)

## 2011-12-18 LAB — CBC WITH DIFFERENTIAL/PLATELET
Basophils Absolute: 0.2 10*3/uL — ABNORMAL HIGH (ref 0.0–0.1)
Basophils Relative: 1 % (ref 0–1)
Eosinophils Absolute: 0.8 10*3/uL — ABNORMAL HIGH (ref 0.0–0.7)
Eosinophils Relative: 6 % — ABNORMAL HIGH (ref 0–5)
HCT: 21 % — ABNORMAL LOW (ref 39.0–52.0)
MCH: 28.7 pg (ref 26.0–34.0)
MCHC: 33.8 g/dL (ref 30.0–36.0)
Monocytes Absolute: 1.8 10*3/uL — ABNORMAL HIGH (ref 0.1–1.0)
Neutro Abs: 7.8 10*3/uL — ABNORMAL HIGH (ref 1.7–7.7)
RDW: 20.2 % — ABNORMAL HIGH (ref 11.5–15.5)

## 2011-12-18 LAB — CULTURE, BLOOD (ROUTINE X 2)
Culture: NO GROWTH
Culture: NO GROWTH

## 2011-12-18 LAB — TYPE AND SCREEN
Antibody Screen: NEGATIVE
Unit division: 0
Unit division: 0

## 2011-12-18 NOTE — Progress Notes (Signed)
Subjective: Pt states that he feels that he is approaching a  point where he will be able to manage is pain at home. He rates his pain as 3-4/10 today. We have discussed  tapering of pain medication so as to prevent withdrawal but pt is very resistant to tapering medication. He states that this has not been done in the past and that he has not had withdrawal symptoms. Objective: Filed Vitals:   12/18/11 0226 12/18/11 0649 12/18/11 0930 12/18/11 1420  BP: 99/60 101/53 107/60 115/73  Pulse: 79 74 79 85  Temp: 98.4 F (36.9 C) 97.6 F (36.4 C) 97.9 F (36.6 C) 98.2 F (36.8 C)  TempSrc: Oral Oral Oral Oral  Resp: 18 16 18 18   Height:      Weight:      SpO2: 96% 98% 99% 99%   Weight change:   Intake/Output Summary (Last 24 hours) at 12/18/11 1649 Last data filed at 12/18/11 1400  Gross per 24 hour  Intake 5828.34 ml  Output      0 ml  Net 5828.34 ml    General: Alert, awake, oriented x3, in no distress.  HEENT: /AT PEERL, EOMI Neck: Trachea midline,  no masses, no thyromegal,y no JVD, no carotid bruit OROPHARYNX:  Moist, No exudate/ erythema/lesions.  Heart: Regular rate and rhythm, without murmurs, rubs, gallops, PMI non-displaced, no heaves or thrills on palpation.  Lungs: Clear to auscultation, no wheezing or rhonchi noted. No increased vocal fremitus resonant to percussion  Abdomen: Soft, nontender, nondistended, positive bowel sounds, no masses no hepatosplenomegaly noted..  Neuro: No focal neurological deficits noted. Musculoskeletal: No warm swelling or erythema around joints, no spinal tenderness noted.   Lab Results:  Basename 12/18/11 0500 12/17/11 0549  NA 133* 134*  K 3.9 4.2  CL 102 104  CO2 23 23  GLUCOSE 110* 98  BUN 6 7  CREATININE 0.59 0.63  CALCIUM 8.4 8.7  MG -- --  PHOS -- --    Basename 12/18/11 0500 12/17/11 0549  AST 49* 50*  ALT 29 26  ALKPHOS 95 100  BILITOT 2.4* 3.0*  PROT 7.4 7.3  ALBUMIN 3.5 3.5   No results found for this  basename: LIPASE:2,AMYLASE:2 in the last 72 hours  Basename 12/18/11 0500 12/17/11 0549  WBC 13.6* 11.8*  NEUTROABS 7.8* 5.8  HGB 7.1* 7.2*  HCT 21.0* 20.8*  MCV 85.0 83.9  PLT 515* 452*   No results found for this basename: CKTOTAL:3,CKMB:3,CKMBINDEX:3,TROPONINI:3 in the last 72 hours No components found with this basename: POCBNP:3 No results found for this basename: DDIMER:2 in the last 72 hours No results found for this basename: HGBA1C:2 in the last 72 hours No results found for this basename: CHOL:2,HDL:2,LDLCALC:2,TRIG:2,CHOLHDL:2,LDLDIRECT:2 in the last 72 hours No results found for this basename: TSH,T4TOTAL,FREET3,T3FREE,THYROIDAB in the last 72 hours No results found for this basename: VITAMINB12:2,FOLATE:2,FERRITIN:2,TIBC:2,IRON:2,RETICCTPCT:2 in the last 72 hours  Micro Results: Recent Results (from the past 240 hour(s))  CULTURE, BLOOD (ROUTINE X 2)     Status: Normal   Collection Time   12/12/11  1:45 PM      Component Value Range Status Comment   Specimen Description BLOOD RIGHT HAND   Final    Special Requests BOTTLES DRAWN AEROBIC ONLY 4CC   Final    Culture  Setup Time 12/12/2011 22:23   Final    Culture NO GROWTH 5 DAYS   Final    Report Status 12/18/2011 FINAL   Final   CULTURE, BLOOD (ROUTINE X  2)     Status: Normal   Collection Time   12/12/11  1:52 PM      Component Value Range Status Comment   Specimen Description BLOOD LEFT HAND   Final    Special Requests BOTTLES DRAWN AEROBIC ONLY 1CC   Final    Culture  Setup Time 12/12/2011 22:23   Final    Culture NO GROWTH 5 DAYS   Final    Report Status 12/18/2011 FINAL   Final   URINE CULTURE     Status: Normal   Collection Time   12/12/11  3:22 PM      Component Value Range Status Comment   Specimen Description URINE, CLEAN CATCH   Final    Special Requests avelox   Final    Culture  Setup Time 12/13/2011 03:09   Final    Colony Count NO GROWTH   Final    Culture NO GROWTH   Final    Report Status  12/14/2011 FINAL   Final     Studies/Results: Dg Chest 2 View  12/10/2011  *RADIOLOGY REPORT*  Clinical Data: Shortness of breath  CHEST - 2 VIEW  Comparison: 11/21/2011  Findings: Cardiomediastinal silhouette is stable.  Again noted perihilar mild increase bronchial markings.  Stable bilateral mild interstitial prominence.  No segmental infiltrate or pulmonary edema.  Bony thorax is stable.  IMPRESSION:  Again noted perihilar mild increase bronchial markings.  Stable bilateral mild interstitial prominence.  No segmental infiltrate or pulmonary edema.  Bony thorax is stable.   Original Report Authenticated By: Natasha Mead, M.D.    Dg Chest 2 View  11/21/2011  *RADIOLOGY REPORT*  Clinical Data: Shortness of breath.  History of sickle cell disease.  CHEST - 2 VIEW  Comparison: 11/03/2011 and 10/02/2011.  Findings: There is stable mild cardiac enlargement.  Mediastinal contours are stable.  There is stable chronic central airway thickening without evidence of superimposed airspace disease, edema or pleural effusion.  The osseous structures appear unchanged. Telemetry leads overlie the chest.  IMPRESSION: Stable mild cardiomegaly and chronic lung disease.  No acute cardiopulmonary process.   Original Report Authenticated By: Gerrianne Scale, M.D.    Dg Hip Complete Left  12/15/2011  *RADIOLOGY REPORT*  Clinical Data: Left hip pain, sickle cell disease  LEFT HIP - COMPLETE 2+ VIEW  Comparison: None.  Findings: A view of the pelvis and two views of the left hip show no acute abnormality.  The left hip joint space is well preserved. The rami are intact.  A right total hip replacement is noted.  IMPRESSION: Negative left hip.  Right total hip replacement.   Original Report Authenticated By: Dwyane Dee, M.D.    Ct Angio Chest Pe W/cm &/or Wo Cm  11/21/2011  *RADIOLOGY REPORT*  Clinical Data: Pleuritic chest pain.  Shortness of breath.  History of sickle cell.  CT ANGIOGRAPHY CHEST  Technique:  Multidetector  CT imaging of the chest using the standard protocol during bolus administration of intravenous contrast. Multiplanar reconstructed images including MIPs were obtained and reviewed to evaluate the vascular anatomy.  Contrast: 80mL OMNIPAQUE IOHEXOL 350 MG/ML SOLN  Comparison: Chest CTA 04/25/2011.  Findings: The pulmonary arteries are adequately opacified with contrast.  There is no evidence of acute pulmonary embolism.  Mild central enlargement of the pulmonary arteries is stable.  There are stable mildly prominent hilar and prevascular lymph nodes bilaterally.  There is no pleural or pericardial effusion.  Right arm PICC remains in place.  Scattered  patchy opacities in both lungs are stable and most consistent with atelectasis or scarring.  There is no endobronchial lesion or confluent airspace opacity.  The visualized upper abdomen has a stable appearance with autoinfarction of the spleen.  Chronic endplate deformities throughout the thoracic spine are stable.  IMPRESSION:  1.  No evidence of acute pulmonary embolism. 2.  Stable scattered pulmonary parenchymal scarring or atelectasis. 3.  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   Original Report Authenticated By: Gerrianne Scale, M.D.    US Renal  11/28/2011  *RADIOLOGY REPORT*  Clinical Data: Sickle cell disease.  Hypertension.  RENAL/URINARY TRACT ULTRASOUND COMPLETE  Comparison:  Abdominal ultrasound 01/11/2011 and 09/10/2011.  Findings:  Right Kidney:  Measures 14.8 cm.  No stone, mass or hydronephrosis. 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  Left Kidney:  Measures 12.4 cm.  No stone, mass or hydronephrosis.  Bladder:  Bilateral ureteral jets are identified.  Urinary bladder is otherwise unremarkable.  IMPRESSION: No acute finding.  Stable compared to prior exam.   Original Report Authenticated By: Bernadene Bell. Maricela Curet,  M.D.    Ir Fluoro Guide Cv Line Right  12/13/2011  *RADIOLOGY REPORT*  Clinical Data: Sickle cell crisis, poor venous access.  Request is made for central venous access for fluids and medications.  PICC LINE PLACEMENT WITH ULTRASOUND AND FLUOROSCOPIC  GUIDANCE  Fluoroscopy Time: 0.3 minutes.  The right arm was prepped with chlorhexidine, draped in the usual sterile fashion using maximum barrier technique (cap and mask, sterile gown, sterile gloves, large sterile sheet, hand hygiene and cutaneous antisepsis) and infiltrated locally with 1% Lidocaine.  Ultrasound demonstrated patency of the right brachial vein, and this was documented with an image.  Under real-time ultrasound guidance, this vein was accessed with a 21 gauge micropuncture needle and image documentation was performed.  The needle was exchanged over a guidewire for a peel-away sheath through which a five [place] double lumen PICC trimmed to 42 cm was advanced, positioned with its tip at the lower SVC/right atrial junction. Fluoroscopy during the procedure and fluoro spot radiograph confirms appropriate catheter position.  The catheter was flushed, secured to the skin with Prolene sutures, and covered with a sterile dressing.  Complications:  None  IMPRESSION: Successful right arm PICC line placement with ultrasound and fluoroscopic guidance.  The catheter is ready for use.  Read by: Jeananne Rama, P.A.-C   Original Report Authenticated By: Richarda Overlie, M.D.    Ir Fluoro Guide Cv Line Right  11/21/2011  *RADIOLOGY REPORT*  Clinical Data: Chest pain, sickle cell anemia and lack of IV access.  PICC LINE PLACEMENT WITH ULTRASOUND AND FLUOROSCOPIC GUIDANCE  Fluoroscopy Time: 0.2 minutes.  The right arm was prepped with chlorhexidine, draped in the usual sterile fashion using maximum barrier technique (cap and mask, sterile gown, sterile gloves, large sterile sheet, hand hygiene and cutaneous antisepsis) and infiltrated locally with 1% Lidocaine.   Ultrasound demonstrated patency of the right brachial vein, and this was documented with an image.  Under real-time ultrasound guidance, this vein was accessed with a 21 gauge micropuncture needle and image documentation was performed.  The needle was exchanged over a guidewire for a peel-away sheath through which a 5 [place] single lumen PICC trimmed to 42 cm was advanced, positioned with its tip at the lower SVC/right atrial junction.  Fluoroscopy during the procedure and fluoro spot radiograph confirms  appropriate catheter position.  The catheter was flushed, secured to the skin with Prolene sutures, and covered with a sterile dressing.  Complications:  None  IMPRESSION: Successful right arm PICC line placement with ultrasound and fluoroscopic guidance.  The catheter is ready for use. A power injectable PICC line was placed.   Original Report Authenticated By: Reola Calkins, M.D.    Ir US Guide Vasc Access Right  12/13/2011  *RADIOLOGY REPORT*  Clinical Data: Sickle cell crisis, poor venous access.  Request is made for central venous access for fluids and medications.  PICC LINE PLACEMENT WITH ULTRASOUND AND FLUOROSCOPIC  GUIDANCE  Fluoroscopy Time: 0.3 minutes.  The right arm was prepped with chlorhexidine, draped in the usual sterile fashion using maximum barrier technique (cap and mask, sterile gown, sterile gloves, large sterile sheet, hand hygiene and cutaneous antisepsis) and infiltrated locally with 1% Lidocaine.  Ultrasound demonstrated patency of the right brachial vein, and this was documented with an image.  Under real-time ultrasound guidance, this vein was accessed with a 21 gauge micropuncture needle and image documentation was performed.  The needle was exchanged over a guidewire for a peel-away sheath through which a five [place] double lumen PICC trimmed to 42 cm was advanced, positioned with its tip at the lower SVC/right atrial junction. Fluoroscopy during the procedure and fluoro spot  radiograph confirms appropriate catheter position.  The catheter was flushed, secured to the skin with Prolene sutures, and covered with a sterile dressing.  Complications:  None  IMPRESSION: Successful right arm PICC line placement with ultrasound and fluoroscopic guidance.  The catheter is ready for use.  Read by: Jeananne Rama, P.A.-C   Original Report Authenticated By: Richarda Overlie, M.D.    Ir US Guide Vasc Access Right  11/21/2011  *RADIOLOGY REPORT*  Clinical Data: Chest pain, sickle cell anemia and lack of IV access.  PICC LINE PLACEMENT WITH ULTRASOUND AND FLUOROSCOPIC GUIDANCE  Fluoroscopy Time: 0.2 minutes.  The right arm was prepped with chlorhexidine, draped in the usual sterile fashion using maximum barrier technique (cap and mask, sterile gown, sterile gloves, large sterile sheet, hand hygiene and cutaneous antisepsis) and infiltrated locally with 1% Lidocaine.  Ultrasound demonstrated patency of the right brachial vein, and this was documented with an image.  Under real-time ultrasound guidance, this vein was accessed with a 21 gauge micropuncture needle and image documentation was performed.  The needle was exchanged over a guidewire for a peel-away sheath through which a 5 [place] single lumen PICC trimmed to 42 cm was advanced, positioned with its tip at the lower SVC/right atrial junction.  Fluoroscopy during the procedure and fluoro spot radiograph confirms appropriate catheter position.  The catheter was flushed, secured to the skin with Prolene sutures, and covered with a sterile dressing.  Complications:  None  IMPRESSION: Successful right arm PICC line placement with ultrasound and fluoroscopic guidance.  The catheter is ready for use. A power injectable PICC line was placed.   Original Report Authenticated By: Reola Calkins, M.D.     Medications: I have reviewed the patient's current medications. Scheduled Meds:    . acetaminophen  650 mg Oral Once  . deferoxamine (DESFERAL) IV   2,000 mg Intravenous QHS  . enoxaparin (LOVENOX) injection  40 mg Subcutaneous Daily  . folic acid  1 mg Oral Daily  . hydroxyurea  500 mg Oral BID  . lisinopril  10 mg Oral Q2200  . moxifloxacin  400 mg Oral Q24H  . OLANZapine  10 mg Oral QHS  . OxyCODONE  80 mg Oral Q12H  . potassium chloride  20 mEq Oral BID   Continuous Infusions:    . sodium chloride 100 mL/hr at 12/18/11 1400   PRN Meds:.albuterol, diphenhydrAMINE, diphenhydrAMINE, HYDROmorphone (DILAUDID) injection, HYDROmorphone, lactulose, ondansetron (ZOFRAN) IV, ondansetron, promethazine, zolpidem Assessment/Plan: Patient Active Hospital Problem List: Sickle cell anemia with crisis (12/14/2011)   Assessment: Pt has had improvement in pain and feels that he will be able to go home in the next 24 hours.    Plan: Will continue pain medication at present dose and plan for discharge hopefully tomorrow.  Leukocytosis (12/17/2010)   Assessment: Felt to be secondary to an inflammatory state. WBC is almost normalized.   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 Pt currently on Desferal.    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  Elevated bilirubin (12/14/2011)   Assessment: Likely secondary to hemolysis.       LOS: 8 days

## 2011-12-19 DIAGNOSIS — G4700 Insomnia, unspecified: Secondary | ICD-10-CM | POA: Diagnosis not present

## 2011-12-19 DIAGNOSIS — M25559 Pain in unspecified hip: Secondary | ICD-10-CM | POA: Diagnosis not present

## 2011-12-19 DIAGNOSIS — I10 Essential (primary) hypertension: Secondary | ICD-10-CM | POA: Diagnosis not present

## 2011-12-19 DIAGNOSIS — D5700 Hb-SS disease with crisis, unspecified: Secondary | ICD-10-CM | POA: Diagnosis not present

## 2011-12-19 DIAGNOSIS — D72829 Elevated white blood cell count, unspecified: Secondary | ICD-10-CM | POA: Diagnosis not present

## 2011-12-19 DIAGNOSIS — G8929 Other chronic pain: Secondary | ICD-10-CM

## 2011-12-19 DIAGNOSIS — E876 Hypokalemia: Secondary | ICD-10-CM | POA: Diagnosis not present

## 2011-12-19 LAB — COMPREHENSIVE METABOLIC PANEL
AST: 48 U/L — ABNORMAL HIGH (ref 0–37)
Albumin: 3.5 g/dL (ref 3.5–5.2)
Calcium: 8.7 mg/dL (ref 8.4–10.5)
Chloride: 105 mEq/L (ref 96–112)
Creatinine, Ser: 0.58 mg/dL (ref 0.50–1.35)
Total Protein: 7.2 g/dL (ref 6.0–8.3)

## 2011-12-19 LAB — CBC WITH DIFFERENTIAL/PLATELET
Basophils Relative: 2 % — ABNORMAL HIGH (ref 0–1)
Eosinophils Relative: 8 % — ABNORMAL HIGH (ref 0–5)
HCT: 20.3 % — ABNORMAL LOW (ref 39.0–52.0)
Hemoglobin: 6.8 g/dL — CL (ref 13.0–17.0)
Lymphs Abs: 2.8 10*3/uL (ref 0.7–4.0)
MCH: 28.7 pg (ref 26.0–34.0)
MCV: 85.7 fL (ref 78.0–100.0)
Monocytes Absolute: 1.8 10*3/uL — ABNORMAL HIGH (ref 0.1–1.0)
Monocytes Relative: 16 % — ABNORMAL HIGH (ref 3–12)
RBC: 2.37 MIL/uL — ABNORMAL LOW (ref 4.22–5.81)
WBC: 11.5 10*3/uL — ABNORMAL HIGH (ref 4.0–10.5)

## 2011-12-19 MED ORDER — HYDROMORPHONE HCL 4 MG PO TABS: 4.0000 mg | ORAL_TABLET | ORAL | Status: DC | PRN

## 2011-12-19 MED ORDER — OXYCODONE HCL 80 MG PO TB12: 80.0000 mg | ORAL_TABLET | Freq: Two times a day (BID) | ORAL | Status: DC

## 2011-12-19 MED ORDER — DEFERASIROX 500 MG PO TBSO: 500.0000 mg | ORAL_TABLET | Freq: Every day | ORAL | Status: DC

## 2011-12-19 NOTE — Discharge Summary (Signed)
Pt seen and examined with NP Vilinda Blanks. Transfusion recommended as pt below baseline. Agree with discharge orders and post- discharge plans. Plan for D/C home today after blood transfusion.

## 2011-12-19 NOTE — Discharge Summary (Signed)
[facility] Discharge Summary   Patient ID: [name] MRN: [id number] DOB/AGE: [birth year] 31 y.o.  Admit date: 12/10/2011 Discharge date: 12/19/2011  Primary Care Physician:  [name], MD  Admission Diagnoses:  Active Problems:  Leukocytosis  Hemoglobin S-S disease  Sickle cell anemia with crisis  Secondary hemochromatosis  Sickle cell hemolytic anemia  Elevated bilirubin   Discharge Diagnoses:   Sickle Cell (Vaso-occlusive) Crisis, resolving Acute on Chronic Pain, improved Left Side CP/DOE/history ACS Hemolytic Anemia of SCD Leukocytosis,improved Secondary Hemochromatosis Hypokalemia, chronic, stable  Insomnia, chronic HTN, controlled Left hip pain, resolving Mood Disorder, stable Vascular Access   Discharge Medications:    Medication List     As of 12/19/2011 12:19 PM    TAKE these medications         deferasirox 500 MG disintegrating tablet   Commonly known as: EXJADE   Take 1 tablet (500 mg total) by mouth daily before breakfast.      folic acid 1 MG tablet   Commonly known as: FOLVITE   Take 1 mg by mouth daily.      HYDROmorphone 4 MG tablet   Commonly known as: DILAUDID   Take 1 tablet (4 mg total) by mouth every 4 (four) hours as needed for pain. For pain.      hydroxyurea 500 MG capsule   Commonly known as: HYDREA   Take 500 mg by mouth 2 (two) times daily. May take with food to minimize GI side effects.      lisinopril 10 MG tablet   Commonly known as: PRINIVIL,ZESTRIL   Take 1 tablet (10 mg total) by mouth daily at 10 pm.      OLANZapine 10 MG tablet   Commonly known as: ZYPREXA   Take 10 mg by mouth at bedtime.      oxyCODONE 80 MG 12 hr tablet   Commonly known as: OXYCONTIN   Take 1 tablet (80 mg total) by mouth every 12 (twelve) hours.      potassium chloride SA 20 MEQ tablet   Commonly known as: K-DUR,KLOR-CON   Take 1 tablet (20 mEq total) by mouth 2 (two) times daily.      zolpidem 10 MG tablet     Commonly known as: AMBIEN   Take 1 tablet (10 mg total) by mouth at bedtime as needed for sleep.         Consults:  None  Significant Diagnostic Studies:  Dg Chest 2 View  12/10/2011  *RADIOLOGY REPORT*  Clinical Data: Shortness of breath  CHEST - 2 VIEW  Comparison: 11/21/2011  Findings: Cardiomediastinal silhouette is stable.  Again noted perihilar mild increase bronchial markings.  Stable bilateral mild interstitial prominence.  No segmental infiltrate or pulmonary edema.  Bony thorax is stable.  IMPRESSION:  Again noted perihilar mild increase bronchial markings.  Stable bilateral mild interstitial prominence.  No segmental infiltrate or pulmonary edema.  Bony thorax is stable.   Original Report Authenticated By: Natasha Mead, M.D.    Dg Chest 2 View  11/21/2011  *RADIOLOGY REPORT*  Clinical Data: Shortness of breath.  History of sickle cell disease.  CHEST - 2 VIEW  Comparison: 11/03/2011 and 10/02/2011.  Findings: There is stable mild cardiac enlargement.  Mediastinal contours are stable.  There is stable chronic central airway thickening without evidence of superimposed airspace disease, edema or pleural effusion.  The osseous structures appear unchanged. Telemetry leads overlie the chest.  IMPRESSION: Stable mild cardiomegaly and chronic lung disease.  No  acute cardiopulmonary process.   Original Report Authenticated By: Gerrianne Scale, M.D.    Dg Hip Complete Left  12/15/2011  *RADIOLOGY REPORT*  Clinical Data: Left hip pain, sickle cell disease  LEFT HIP - COMPLETE 2+ VIEW  Comparison: None.  Findings: A view of the pelvis and two views of the left hip show no acute abnormality.  The left hip joint space is well preserved. The rami are intact.  A right total hip replacement is noted.  IMPRESSION: Negative left hip.  Right total hip replacement.   Original Report Authenticated By: Dwyane Dee, M.D.    Ct Angio Chest Pe W/cm &/or Wo Cm  11/21/2011  *RADIOLOGY REPORT*  Clinical Data:  Pleuritic chest pain.  Shortness of breath.  History of sickle cell.  CT ANGIOGRAPHY CHEST  Technique:  Multidetector CT imaging of the chest using the standard protocol during bolus administration of intravenous contrast. Multiplanar reconstructed images including MIPs were obtained and reviewed to evaluate the vascular anatomy.  Contrast: 80mL OMNIPAQUE IOHEXOL 350 MG/ML SOLN  Comparison: Chest CTA 04/25/2011.  Findings: The pulmonary arteries are adequately opacified with contrast.  There is no evidence of acute pulmonary embolism.  Mild central enlargement of the pulmonary arteries is stable.  There are stable mildly prominent hilar and prevascular lymph nodes bilaterally.  There is no pleural or pericardial effusion.  Right arm PICC remains in place.  Scattered patchy opacities in both lungs are stable and most consistent with atelectasis or scarring.  There is no endobronchial lesion or confluent airspace opacity.  The visualized upper abdomen has a stable appearance with autoinfarction of the spleen.  Chronic endplate deformities throughout the thoracic spine are stable.  IMPRESSION:  1.  No evidence of acute pulmonary embolism. 2.  Stable scattered pulmonary parenchymal scarring or atelectasis. 3.  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   Original Report Authenticated By: Gerrianne Scale, M.D.    US Renal  11/28/2011  *RADIOLOGY REPORT*  Clinical Data: Sickle cell disease.  Hypertension.  RENAL/URINARY TRACT ULTRASOUND COMPLETE  Comparison:  Abdominal ultrasound 01/11/2011 and 09/10/2011.  Findings:  Right Kidney:  Measures 14.8 cm.  No stone, mass or hydronephrosis. 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  Left Kidney:  Measures 12.4 cm.  No stone, mass or hydronephrosis.  Bladder:  Bilateral ureteral jets are identified.  Urinary bladder is otherwise unremarkable.   IMPRESSION: No acute finding.  Stable compared to prior exam.   Original Report Authenticated By: Bernadene Bell. Maricela Curet, M.D.    Ir Fluoro Guide Cv Line Right  12/13/2011  *RADIOLOGY REPORT*  Clinical Data: Sickle cell crisis, poor venous access.  Request is made for central venous access for fluids and medications.  PICC LINE PLACEMENT WITH ULTRASOUND AND FLUOROSCOPIC  GUIDANCE  Fluoroscopy Time: 0.3 minutes.  The right arm was prepped with chlorhexidine, draped in the usual sterile fashion using maximum barrier technique (cap and mask, sterile gown, sterile gloves, large sterile sheet, hand hygiene and cutaneous antisepsis) and infiltrated locally with 1% Lidocaine.  Ultrasound demonstrated patency of the right brachial vein, and this was documented with an image.  Under real-time ultrasound guidance, this vein was accessed with a 21 gauge micropuncture needle and image documentation was performed.  The needle was exchanged over a guidewire for a peel-away sheath through which a five [place] double lumen PICC trimmed to 42 cm was advanced, positioned  with its tip at the lower SVC/right atrial junction. Fluoroscopy during the procedure and fluoro spot radiograph confirms appropriate catheter position.  The catheter was flushed, secured to the skin with Prolene sutures, and covered with a sterile dressing.  Complications:  None  IMPRESSION: Successful right arm PICC line placement with ultrasound and fluoroscopic guidance.  The catheter is ready for use.  Read by: Jeananne Rama, P.A.-C   Original Report Authenticated By: Richarda Overlie, M.D.    Ir Fluoro Guide Cv Line Right  11/21/2011  *RADIOLOGY REPORT*  Clinical Data: Chest pain, sickle cell anemia and lack of IV access.  PICC LINE PLACEMENT WITH ULTRASOUND AND FLUOROSCOPIC GUIDANCE  Fluoroscopy Time: 0.2 minutes.  The right arm was prepped with chlorhexidine, draped in the usual sterile fashion using maximum barrier technique (cap and mask, sterile gown, sterile  gloves, large sterile sheet, hand hygiene and cutaneous antisepsis) and infiltrated locally with 1% Lidocaine.  Ultrasound demonstrated patency of the right brachial vein, and this was documented with an image.  Under real-time ultrasound guidance, this vein was accessed with a 21 gauge micropuncture needle and image documentation was performed.  The needle was exchanged over a guidewire for a peel-away sheath through which a 5 [place] single lumen PICC trimmed to 42 cm was advanced, positioned with its tip at the lower SVC/right atrial junction.  Fluoroscopy during the procedure and fluoro spot radiograph confirms appropriate catheter position.  The catheter was flushed, secured to the skin with Prolene sutures, and covered with a sterile dressing.  Complications:  None  IMPRESSION: Successful right arm PICC line placement with ultrasound and fluoroscopic guidance.  The catheter is ready for use. A power injectable PICC line was placed.   Original Report Authenticated By: Reola Calkins, M.D.    Ir US Guide Vasc Access Right  12/13/2011  *RADIOLOGY REPORT*  Clinical Data: Sickle cell crisis, poor venous access.  Request is made for central venous access for fluids and medications.  PICC LINE PLACEMENT WITH ULTRASOUND AND FLUOROSCOPIC  GUIDANCE  Fluoroscopy Time: 0.3 minutes.  The right arm was prepped with chlorhexidine, draped in the usual sterile fashion using maximum barrier technique (cap and mask, sterile gown, sterile gloves, large sterile sheet, hand hygiene and cutaneous antisepsis) and infiltrated locally with 1% Lidocaine.  Ultrasound demonstrated patency of the right brachial vein, and this was documented with an image.  Under real-time ultrasound guidance, this vein was accessed with a 21 gauge micropuncture needle and image documentation was performed.  The needle was exchanged over a guidewire for a peel-away sheath through which a five [place] double lumen PICC trimmed to 42 cm was advanced,  positioned with its tip at the lower SVC/right atrial junction. Fluoroscopy during the procedure and fluoro spot radiograph confirms appropriate catheter position.  The catheter was flushed, secured to the skin with Prolene sutures, and covered with a sterile dressing.  Complications:  None  IMPRESSION: Successful right arm PICC line placement with ultrasound and fluoroscopic guidance.  The catheter is ready for use.  Read by: Jeananne Rama, P.A.-C   Original Report Authenticated By: Richarda Overlie, M.D.    Ir US Guide Vasc Access Right  11/21/2011  *RADIOLOGY REPORT*  Clinical Data: Chest pain, sickle cell anemia and lack of IV access.  PICC LINE PLACEMENT WITH ULTRASOUND AND FLUOROSCOPIC GUIDANCE  Fluoroscopy Time: 0.2 minutes.  The right arm was prepped with chlorhexidine, draped in the usual sterile fashion using maximum barrier technique (cap and mask, sterile gown,  sterile gloves, large sterile sheet, hand hygiene and cutaneous antisepsis) and infiltrated locally with 1% Lidocaine.  Ultrasound demonstrated patency of the right brachial vein, and this was documented with an image.  Under real-time ultrasound guidance, this vein was accessed with a 21 gauge micropuncture needle and image documentation was performed.  The needle was exchanged over a guidewire for a peel-away sheath through which a 5 [place] single lumen PICC trimmed to 42 cm was advanced, positioned with its tip at the lower SVC/right atrial junction.  Fluoroscopy during the procedure and fluoro spot radiograph confirms appropriate catheter position.  The catheter was flushed, secured to the skin with Prolene sutures, and covered with a sterile dressing.  Complications:  None  IMPRESSION: Successful right arm PICC line placement with ultrasound and fluoroscopic guidance.  The catheter is ready for use. A power injectable PICC line was placed.   Original Report Authenticated By: Reola Calkins, M.D.    [facility] Course:  For  complete details please refer to admission H and P, but in brief, [name] is a 31 y.o. AA male with SS genotype Sickle Cell Disease who presented to the [facility] on [facility]/10/11 with Left side non-pleuritic chest pain, DOE and transient wheezing accompanied by blurred vision, sinus congestion and post nasal drip. Initial labs revealed elevated Retic 11.0%, WBC 26.9, and T. Bili 3.1 indicative of active hemolysis. After 23 hours observation, he was noted to have an increasing T. Bili of 3.6, WBC 27.1 with increasing limb pain and admitted for further evaluation and treatment. Sickle Cell Crisis was treated with gentle IV hydration, anti-emetic prn, anti-pruretic prn and a bowel management regimen and his crisis slowly improved without the need for exchange transfusions and was noted to have a T. Bili 2.1 this am prior to discharge. He was also given Lovenox for DVT prophylaxis. His most recent Hgb electrophoresis was on 11/07/11 with Hgb A 23.4, Hgb F 5.5%, and Hgb S 67.7% and he was maintained on Hydrea and folic Acid daily. He had a repeat Hgb electrophoresis pending at the time of this dictation. Acute on Chronic Pain was difficult to control initially as treatment was delayed due to lack of vascular access. He was given IV Dilaudid prn severe pain, and maintained on his long-acting Oxycontin 80 mg BID. He was then started on his home dose Dilaudid 4mg  po to transition to po as much as possible prior to discharge. On the date of discharge, he felt that he was able to manage his pain at home. Rx refills given for Dilaudid 4mg  po every 4 hrs prn (30, 0R), and Oxycodone 80 mg every 12 hr (##),0).  Left Side CP/DOE/history ACS was suspected secondary to his complaints of DOE on admission. This was further evaluated with a negative CXR on admission. He continued to have left chest discomfort, which was occasionally accompanied by a yellowish-green productive cough. He was empirically placed on Avelox and completed a  course as noted below. He was also encouraged to continue Incentive Spirometry and prn Albuterol MDI. This slowly improved and he was without complaints at the time of discharge. Hemolytic Anemia of SCD was followed closely noting a trend down from 7.9 on admission to the [facility]. He was transfused initially with a 6.2 Hgb, which was felt secondary to active hemolysis and transfused again prior to discharge with a Hgb 6.8. He was asymptomatic and will have a repeat Hgb/Hgct prior to discharge. This can be followed up with his  hospital follow up visit in the outpatient setting. Leukocytosis was identified with a WBC of 27.1 on admission. This was felt secondary to inflammatory response with SCD as the workup was negative and he remained afebrile. He was empirically placed on Avelox and completed a 7 day course. He was afebrile with a WBC 11. 5 the am of discharge. Secondary Hemochromatosis was identified chronic noting his most recent Ferritin level of 2022 on 12/13/11 and treated with IV Desferal qhs. He will resume Exjade 500mg  po daily at discharge. He was given a new Rx (#30, 2R). Hypokalemia was chronic and became severe on admission with K levels as low as 3.0 despite IV and po potassium supplementation. Eventually, his K levels normalized and he was able to maintain an adequate K levels on his home dose of KCl po to BID. Insomnia was noted to be chronic and  improved with Ambien 10mg  po qhs prn. HTN was not problematic. He remained on Lisinopril 10 mg po daily. His BP occasionally became soft, which was felt secondary to his prn pain medications and parameters to hold for SBP <110 were initiated. He had a BP 110/67 at the time of discharge and will continue this low dose ACEI for it's cardiorenal protective properties.  Left hip pain developed during this admission and with continuous pain and prior AVN with replacement right hip, he underwent negative full view films and therefore the pain was felt  secondary to crisis. This pain slowly improved and did not limit his mobility. Mood Disorder was not problematic as he remained stable on his Zyprexa. Vascular Access has been problematic and with frequently requiring PICC line placement, including this admission, he has been scheduled to see Dr. Avel Peace on January 03, 2012 at 9:20 am to evaluate for Port-A-Cath placement. He had no new complaints and was in no acute distress at the time of discharge.   Physical Exam at Discharge:  BP 104/63  Pulse 80  Temp 98.3 F (36.8 C) (Oral)  Resp 16  Ht 6' (1.829 m)  Wt 80.196 kg (176 lb 12.8 oz)  BMI 23.98 kg/m2  SpO2 99%  General Appearance: Alert, Ox3, cooperative, WD, WN, mild distress  Head: Normocephalic, atraumatic  Eyes: PERRL, slight strabismus, mild icteric sclera  Back: Symmetric, no CVA tenderness  Resp: even and nonlabored, diminished bilateral bases, no vocal fremitus; no wheezes/rales/rhonchi  Cardio: S1, S2 regular, no murmurs/clicks/rubs/gallops  GI: Soft, ND, active bowel sounds, no masses, no organomegaly appreciated  Male Genitalia: Deferred, the patient denies priapism  Extremities/MSK: Extremities normal, atraumatic, no cyanosis or edema, some right hip, and right thigh tenderness with negative Homans; R upper arm PICC  Pulses: 2+ and symmetric  Skin: Skin intact  Neurologic: Grossly intact, no focal deficits  Psych: Appropriate affect    Disposition at Discharge: Home or self care  Discharge Orders: call and schedule a follow up appointment with Dr. [name] or [name], NP in the office within 1 wk of discharge     Discharge Orders    Future Appointments: Provider: Department: Dept Phone: Center:   01/03/2012 9:20 AM Adolph Pollack, MD [facility], [place] [phone] None     Future Orders Please Complete By Expires   Discharge patient      Comments:   May discharge after the following:  1 unit PRBC's transfused; rpeat labs obtained; and  PICC line removed      Condition at Discharge:   Stable  Time spent on Discharge:  Greater than 30 minutes.  SignedLyda Perone, Belicia Difatta S 12/19/2011, 12:19 PM

## 2011-12-19 NOTE — Progress Notes (Signed)
CRITICAL VALUE ALERT  Critical value received:  HGB 6.8  Date of notification:  11/18  Time of notification:  0515  Critical value read back:YES  Nurse who received alert:  Jilda Panda  MD notified (1st page):  HARWANI  Time of first page:  0530  MD notified (2nd page):  Time of second page:  Responding MD:  Sharyn Lull  Time MD responded:  [phone]

## 2011-12-19 NOTE — Progress Notes (Signed)
Patient to be discharged after 1900 H&H is drawn. All discharge medications and instructions reviewed and questions answered.  Patient refuses wheelchair assistance to vehicle, states will ambulate.

## 2011-12-20 LAB — TYPE AND SCREEN

## 2012-01-03 ENCOUNTER — Telehealth (HOSPITAL_COMMUNITY): Payer: Self-pay | Admitting: *Deleted

## 2012-01-03 ENCOUNTER — Ambulatory Visit (INDEPENDENT_AMBULATORY_CARE_PROVIDER_SITE_OTHER): Payer: Medicare Other | Admitting: General Surgery

## 2012-01-03 ENCOUNTER — Non-Acute Institutional Stay (HOSPITAL_COMMUNITY)
Admission: AD | Admit: 2012-01-03 | Discharge: 2012-01-04 | Disposition: A | Discharge status: Home / Self Care | Payer: Medicare Other | Attending: Internal Medicine | Admitting: Internal Medicine

## 2012-01-03 ENCOUNTER — Encounter (INDEPENDENT_AMBULATORY_CARE_PROVIDER_SITE_OTHER): Payer: Self-pay | Admitting: General Surgery

## 2012-01-03 ENCOUNTER — Encounter (HOSPITAL_COMMUNITY): Payer: Self-pay | Admitting: *Deleted

## 2012-01-03 ENCOUNTER — Encounter (HOSPITAL_COMMUNITY): Payer: Self-pay | Admitting: Pharmacy Technician

## 2012-01-03 VITALS — BP 106/70 | HR 85 | Temp 98.0°F | Resp 18 | Ht 72.0 in | Wt 170.0 lb

## 2012-01-03 DIAGNOSIS — E876 Hypokalemia: Secondary | ICD-10-CM | POA: Diagnosis not present

## 2012-01-03 DIAGNOSIS — Z86718 Personal history of other venous thrombosis and embolism: Secondary | ICD-10-CM | POA: Diagnosis not present

## 2012-01-03 DIAGNOSIS — N058 Unspecified nephritic syndrome with other morphologic changes: Secondary | ICD-10-CM | POA: Insufficient documentation

## 2012-01-03 DIAGNOSIS — D72829 Elevated white blood cell count, unspecified: Secondary | ICD-10-CM | POA: Insufficient documentation

## 2012-01-03 DIAGNOSIS — D571 Sickle-cell disease without crisis: Secondary | ICD-10-CM | POA: Diagnosis not present

## 2012-01-03 DIAGNOSIS — D5700 Hb-SS disease with crisis, unspecified: Secondary | ICD-10-CM | POA: Diagnosis not present

## 2012-01-03 DIAGNOSIS — J984 Other disorders of lung: Secondary | ICD-10-CM | POA: Diagnosis not present

## 2012-01-03 DIAGNOSIS — M79609 Pain in unspecified limb: Secondary | ICD-10-CM | POA: Diagnosis not present

## 2012-01-03 DIAGNOSIS — Z96649 Presence of unspecified artificial hip joint: Secondary | ICD-10-CM | POA: Diagnosis not present

## 2012-01-03 LAB — COMPREHENSIVE METABOLIC PANEL
BUN: 5 mg/dL — ABNORMAL LOW (ref 6–23)
CO2: 23 mEq/L (ref 19–32)
Chloride: 102 mEq/L (ref 96–112)
Creatinine, Ser: 0.5 mg/dL (ref 0.50–1.35)
GFR calc Af Amer: >90 mL/min (ref >90)
GFR calc non Af Amer: >90 mL/min (ref >90)
Glucose, Bld: 101 mg/dL — ABNORMAL HIGH (ref 70–99)
Total Bilirubin: 4.6 mg/dL — ABNORMAL HIGH (ref 0.3–1.2)

## 2012-01-03 LAB — CBC WITH DIFFERENTIAL/PLATELET
Basophils Relative: 1 % (ref 0–1)
Eosinophils Absolute: 0.3 10*3/uL (ref 0.0–0.7)
Hemoglobin: 8.9 g/dL — ABNORMAL LOW (ref 13.0–17.0)
MCH: 29.6 pg (ref 26.0–34.0)
MCHC: 34.5 g/dL (ref 30.0–36.0)
Monocytes Relative: 13 % — ABNORMAL HIGH (ref 3–12)
Neutrophils Relative %: 71 % (ref 43–77)
Platelets: 522 10*3/uL — ABNORMAL HIGH (ref 150–400)

## 2012-01-03 LAB — RETICULOCYTES
RBC.: 3.01 MIL/uL — ABNORMAL LOW (ref 4.22–5.81)
Retic Ct Pct: 8.2 % — ABNORMAL HIGH (ref 0.4–3.1)

## 2012-01-03 MED ORDER — ONDANSETRON HCL 4 MG/2ML IJ SOLN
4.0000 mg | INTRAMUSCULAR | Status: DC | PRN
  Administered 2012-01-03 – 2012-01-04 (×5): 4 mg via INTRAVENOUS
  Filled 2012-01-03 (×5): qty 2

## 2012-01-03 MED ORDER — POTASSIUM CHLORIDE CRYS ER 20 MEQ PO TBCR
40.0000 meq | EXTENDED_RELEASE_TABLET | Freq: Two times a day (BID) | ORAL | Status: DC
  Administered 2012-01-03 – 2012-01-04 (×2): 40 meq via ORAL
  Filled 2012-01-03 (×2): qty 2

## 2012-01-03 MED ORDER — DIPHENHYDRAMINE HCL 25 MG PO CAPS
25.0000 mg | ORAL_CAPSULE | ORAL | Status: DC | PRN
  Administered 2012-01-03 – 2012-01-04 (×2): 50 mg via ORAL
  Filled 2012-01-03 (×2): qty 2

## 2012-01-03 MED ORDER — HYDROXYUREA 500 MG PO CAPS
500.0000 mg | ORAL_CAPSULE | Freq: Two times a day (BID) | ORAL | Status: DC
  Administered 2012-01-03 – 2012-01-04 (×2): 500 mg via ORAL
  Filled 2012-01-03 (×3): qty 1

## 2012-01-03 MED ORDER — ONDANSETRON HCL 4 MG PO TABS
4.0000 mg | ORAL_TABLET | ORAL | Status: DC | PRN
  Administered 2012-01-04: 4 mg via ORAL
  Filled 2012-01-03: qty 1

## 2012-01-03 MED ORDER — DIPHENHYDRAMINE HCL 50 MG/ML IJ SOLN
12.5000 mg | INTRAMUSCULAR | Status: DC | PRN
  Administered 2012-01-03 – 2012-01-04 (×4): 25 mg via INTRAVENOUS
  Filled 2012-01-03 (×4): qty 1

## 2012-01-03 MED ORDER — OLANZAPINE 10 MG PO TABS
10.0000 mg | ORAL_TABLET | Freq: Every day | ORAL | Status: DC
  Administered 2012-01-03: 10 mg via ORAL
  Filled 2012-01-03 (×2): qty 1

## 2012-01-03 MED ORDER — HYDROMORPHONE HCL PF 2 MG/ML IJ SOLN
2.0000 mg | INTRAMUSCULAR | Status: DC | PRN
  Administered 2012-01-03 – 2012-01-04 (×7): 4 mg via INTRAVENOUS
  Administered 2012-01-04: 2 mg via INTRAVENOUS
  Administered 2012-01-04 (×3): 4 mg via INTRAVENOUS
  Filled 2012-01-03 (×5): qty 2
  Filled 2012-01-03: qty 1
  Filled 2012-01-03 (×6): qty 2

## 2012-01-03 MED ORDER — FOLIC ACID 1 MG PO TABS
1.0000 mg | ORAL_TABLET | Freq: Every day | ORAL | Status: DC
  Administered 2012-01-03 – 2012-01-04 (×2): 1 mg via ORAL
  Filled 2012-01-03 (×2): qty 1

## 2012-01-03 MED ORDER — ZOLPIDEM TARTRATE 5 MG PO TABS
10.0000 mg | ORAL_TABLET | Freq: Every evening | ORAL | Status: DC | PRN
  Administered 2012-01-03: 10 mg via ORAL
  Filled 2012-01-03: qty 2

## 2012-01-03 MED ORDER — HYDROMORPHONE HCL PF 2 MG/ML IJ SOLN
4.0000 mg | INTRAMUSCULAR | Status: DC | PRN
  Administered 2012-01-03: 4 mg via INTRAMUSCULAR

## 2012-01-03 MED ORDER — DEXTROSE-NACL 5-0.45 % IV SOLN
INTRAVENOUS | Status: DC
  Administered 2012-01-03: 13:00:00 via INTRAVENOUS

## 2012-01-03 MED ORDER — OXYCODONE HCL 80 MG PO TB12: 80.0000 mg | ORAL_TABLET | Freq: Two times a day (BID) | ORAL | Status: DC

## 2012-01-03 MED ORDER — POTASSIUM CHLORIDE IN NACL 20-0.45 MEQ/L-% IV SOLN
INTRAVENOUS | Status: DC
  Administered 2012-01-03 – 2012-01-04 (×2): via INTRAVENOUS
  Filled 2012-01-03 (×3): qty 1000

## 2012-01-03 MED ORDER — OXYCODONE HCL ER 80 MG PO T12A
80.0000 mg | EXTENDED_RELEASE_TABLET | Freq: Two times a day (BID) | ORAL | Status: DC
  Administered 2012-01-03 – 2012-01-04 (×2): 80 mg via ORAL
  Filled 2012-01-03 (×2): qty 1

## 2012-01-03 MED ORDER — LISINOPRIL 10 MG PO TABS
10.0000 mg | ORAL_TABLET | Freq: Every day | ORAL | Status: DC
  Administered 2012-01-03: 10 mg via ORAL
  Filled 2012-01-03 (×2): qty 1

## 2012-01-03 NOTE — Progress Notes (Signed)
Pt c/o a headache that started about 5-10 minutes after receiving medication via IV. No other complaints. Appears to be resting more comfortably. Call light with in reach. Watching TV.

## 2012-01-03 NOTE — Patient Instructions (Signed)
Do not lift anything heavier than 10 pounds for one week after the Port-A-Cath is placed.

## 2012-01-03 NOTE — Progress Notes (Signed)
Pt resting comfortably at this time. C/o bilateral rib cage pain that started last pm. Pt with no other complaints at this time.

## 2012-01-03 NOTE — Telephone Encounter (Signed)
[facility] w/ c/o rib and leg pain, 7/10, onset last pm. Denies fever, cough, chest pain, abdominal pain, or priapism. Reports some hot/cold flashes and loose but not watery BM's x 2 days. Reported to Dr [name]; patient informed he may come to [facility] for treatment.

## 2012-01-03 NOTE — H&P (Signed)
[name] [facility]  Patient ID: [name], male   DOB: 16-Jun-1980, 31 y.o.   MRN: [id number]  Chief Complaint  Patient presents with  . Sickle Cell Pain Crisis    HPI [name] is a 31 y.o. male.  With hemoglobin SS disease who was doing well until this morning. Patient reports he was feeling well yesterday. When he woke up this morning, he had bilateral aching rib pain. He also had pain in his left lower leg. Pain was at most 8/10. He took his usual medication with mild relief. He had an appointment to see Dr. Abbey Chatters regarding a Port-A-Cath placement. He came over to the clinic thereafter. He denies fever chills or night sweats. He's had a rare nonproductive cough. He's been taking his medication on a regular basis. Patient has been avoiding any smoke exposure. He's otherwise been feeling well.   Past Medical History  Diagnosis Date  . Sickle cell anemia   . Blood transfusion   . 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 Hypokalemia   . Mood disorder   . Pulmonary embolism   . Avascular necrosis   . Leukocytosis     Chronic  . Thrombocytosis     Chronic    Past Surgical History  Procedure Date  . Right hip replacement     08/2006  . Cholecystectomy     01/2008  . Porta cath placement   . Porta cath removal   . Umbilical hernia repair     01/2008  . Excision of left periauricular cyst     10/2009  . Excision of right ear lobe cyst with primary closur     11/2007    Family History  Problem Relation Age of Onset  . Sickle cell anemia Mother   . Sickle cell anemia Father   . Sickle cell trait Brother     Social History History  Substance Use Topics  . Smoking status: Former Smoker -- 13 years    Types: Cigarettes    Quit date: 07/08/2010  . Smokeless tobacco: Never Used  . Alcohol Use: No    Allergies  Allergen Reactions  . Morphine And Related Hives and Rash    Pt states he is able to tolerate  Dilaudid with no reactions.    Current Facility-Administered Medications  Medication Dose Route Frequency Provider Last Rate Last Dose  . dextrose 5 %-0.45 % sodium chloride infusion   Intravenous Continuous Gwenyth Bender, MD 125 mL/hr at 01/03/12 1244    . diphenhydrAMINE (BENADRYL) capsule 25-50 mg  25-50 mg Oral Q4H PRN Gwenyth Bender, MD   50 mg at 01/03/12 1118   Or  . diphenhydrAMINE (BENADRYL) injection 12.5-25 mg  12.5-25 mg Intravenous Q4H PRN Gwenyth Bender, MD      . folic acid (FOLVITE) tablet 1 mg  1 mg Oral Daily Gwenyth Bender, MD   1 mg at 01/03/12 1118  . HYDROmorphone (DILAUDID) injection 2-4 mg  2-4 mg Intravenous Q2H PRN Gwenyth Bender, MD   4 mg at 01/03/12 1317  . HYDROmorphone (DILAUDID) injection 4 mg  4 mg Intramuscular Q2H PRN Gwenyth Bender, MD   4 mg at 01/03/12 1116  . ondansetron (ZOFRAN) tablet 4 mg  4 mg Oral Q4H PRN Gwenyth Bender, MD       Or  . ondansetron [facility]) injection 4 mg  4 mg Intravenous Q4H PRN Gwenyth Bender, MD  4 mg at 01/03/12 1315    Review of Systems 10 point review of systems otherwise notable for occasional crampy pain in his left leg at night. He uses incentive spirometry sporadically.  Blood pressure 120/82, pulse 76, temperature 98.3 F (36.8 C), temperature source Oral, resp. rate 18, weight 170 lb (77.111 kg), SpO2 99.00%.  Physical Exam Well-developed well-nourished black male in mild distress. HEENT: No sinus tenderness. Positive sclera icterus. TMs were clear. Throat with minimal posterior pharyngeal erythema. NECK: No enlarged thyroid. No posterior cervical nodes. LUNGS: Decreased breath sounds at bases bilaterally. No wheezes. Minimal vocal fremitus. No CVA tenderness. CV: Normal S1, S2 without S3. Mild tenderness low costochondral regions bilaterally. ABDOMEN: Left upper quadrant tenderness. No masses appreciated. MSK: No shoulder or joint tenderness. Tenderness along the anterior to regions left leg. No appreciable. No edema in ankles.  Negative Homans. NEURO: Nonfocal. Intact.    Data Reviewed  Results for orders placed during the hospital encounter of 01/03/12 (from the past 48 hour(s))  COMPREHENSIVE METABOLIC PANEL     Status: Abnormal   Collection Time   01/03/12 12:35 PM      Component Value Range Comment   Sodium 136  135 - 145 mEq/L    Potassium 3.0 (*) 3.5 - 5.1 mEq/L    Chloride 102  96 - 112 mEq/L    CO2 23  19 - 32 mEq/L    Glucose, Bld 101 (*) 70 - 99 mg/dL    BUN 5 (*) 6 - 23 mg/dL    Creatinine, Ser 1.61  0.50 - 1.35 mg/dL    Calcium 9.3  8.4 - 09.6 mg/dL    Total Protein 8.2  6.0 - 8.3 g/dL    Albumin 4.2  3.5 - 5.2 g/dL    AST 27  0 - 37 U/L    ALT 18  0 - 53 U/L    Alkaline Phosphatase 91  39 - 117 U/L    Total Bilirubin 4.6 (*) 0.3 - 1.2 mg/dL    GFR calc non Af Amer >90  >90 mL/min    GFR calc Af Amer >90  >90 mL/min   RETICULOCYTES     Status: Abnormal   Collection Time   01/03/12 12:35 PM      Component Value Range Comment   Retic Ct Pct 8.2 (*) 0.4 - 3.1 %    RBC. 3.01 (*) 4.22 - 5.81 MIL/uL    Retic Count, Manual 246.8 (*) 19.0 - 186.0 K/uL   CBC WITH DIFFERENTIAL     Status: Abnormal   Collection Time   01/03/12 12:35 PM      Component Value Range Comment   WBC 19.9 (*) 4.0 - 10.5 K/uL    RBC 3.01 (*) 4.22 - 5.81 MIL/uL    Hemoglobin 8.9 (*) 13.0 - 17.0 g/dL    HCT 04.5 (*) 40.9 - 52.0 %    MCV 85.7  78.0 - 100.0 fL    MCH 29.6  26.0 - 34.0 pg    MCHC 34.5  30.0 - 36.0 g/dL    RDW 81.1 (*) 91.4 - 15.5 %    Platelets 522 (*) 150 - 400 K/uL    Neutrophils Relative 71  43 - 77 %    Neutro Abs 14.0 (*) 1.7 - 7.7 K/uL    Lymphocytes Relative 15  12 - 46 %    Lymphs Abs 3.0  0.7 - 4.0 K/uL    Monocytes Relative 13 (*) 3 - 12 %  Monocytes Absolute 2.5 (*) 0.1 - 1.0 K/uL    Eosinophils Relative 1  0 - 5 %    Eosinophils Absolute 0.3  0.0 - 0.7 K/uL    Basophils Relative 1  0 - 1 %    Basophils Absolute 0.1  0.0 - 0.1 K/uL    Laboratory data reviewed.  Assessment     Sickle cell crisis. 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 Leukocytosis, reactive. Sickle cell lung disease. History of acute chest syndrome. History of internal jugular vein thrombosis.    Plan    IV hydration with half-normal saline with potassium replacement. K-Dur 40 mEq by mouth twice a day Acute pain treatment with IV Dilaudid every 2 hours. Encourage incentive spirometry use. Continues OxyContin 80 mg twice a day as well. Followup CBC, CMET in a.m. Continue his folate and hydroxyurea.       Ziya Coonrod 01/03/2012, 1:23 PM

## 2012-01-03 NOTE — Progress Notes (Signed)
Patient ID: [name], male   DOB: 03-14-1980, 31 y.o.   MRN: [id number]  Chief Complaint  Patient presents with  . New Evaluation    PAC     HPI [name] is a 31 y.o. male.   HPI  He is referred by Dr. [name] for Port-A-Cath placement for long-term venous access. He has sickle cell anemia and has a crisis about every 2 months it requires him to go to the hospital. There is difficulty with vascular access. He had a previous Port-A-Cath in on the right side but it became infected. He also had thrombosis of the right internal jugular vein.  Past Medical History  Diagnosis Date  . Sickle cell anemia   . Blood transfusion   . 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 Hypokalemia   . Mood disorder   . Pulmonary embolism   . Avascular necrosis   . Leukocytosis     Chronic  . Thrombocytosis     Chronic    Past Surgical History  Procedure Date  . Right hip replacement     08/2006  . Cholecystectomy     01/2008  . Porta cath placement   . Porta cath removal   . Umbilical hernia repair     01/2008  . Excision of left periauricular cyst     10/2009  . Excision of right ear lobe cyst with primary closur     11/2007    Family History  Problem Relation Age of Onset  . Sickle cell anemia Mother   . Sickle cell anemia Father   . Sickle cell trait Brother     Social History History  Substance Use Topics  . Smoking status: Former Smoker -- 13 years    Types: Cigarettes    Quit date: 07/08/2010  . Smokeless tobacco: Never Used  . Alcohol Use: No    Allergies  Allergen Reactions  . Morphine And Related Hives and Rash    Pt states he is able to tolerate Dilaudid with no reactions.    Current Outpatient Prescriptions  Medication Sig Dispense Refill  . folic acid (FOLVITE) 1 MG tablet Take 1 mg by mouth daily.      Marland Kitchen HYDROmorphone (DILAUDID) 4 MG tablet Take 1 tablet (4 mg total) by mouth every 4 (four) hours as needed for pain. For  pain.  30 tablet  0  . hydroxyurea (HYDREA) 500 MG capsule Take 500 mg by mouth 2 (two) times daily. May take with food to minimize GI side effects.      Marland Kitchen lisinopril (PRINIVIL,ZESTRIL) 10 MG tablet Take 1 tablet (10 mg total) by mouth daily at 10 pm.  30 tablet  2  . oxyCODONE (OXYCONTIN) 80 MG 12 hr tablet Take 1 tablet (80 mg total) by mouth every 12 (twelve) hours.  30 tablet  0  . potassium chloride SA (K-DUR,KLOR-CON) 20 MEQ tablet Take 1 tablet (20 mEq total) by mouth 2 (two) times daily.  60 tablet  1  . zolpidem (AMBIEN) 10 MG tablet       . deferasirox (EXJADE) 500 MG disintegrating tablet Take 1 tablet (500 mg total) by mouth daily before breakfast.  30 tablet  2  . OLANZapine (ZYPREXA) 10 MG tablet Take 10 mg by mouth at bedtime.        Review of Systems Review of Systems  Constitutional: Negative for fever, chills and unexpected weight change.  Respiratory: Negative.   Cardiovascular:  Negative.   Gastrointestinal: Negative.     Blood pressure 106/70, pulse 85, temperature 98 F (36.7 C), temperature source Temporal, resp. rate 18, height 6' (1.829 m), weight 170 lb (77.111 kg).  Physical Exam Physical Exam  Constitutional: No distress.       Thin male  HENT:  Head: Normocephalic and atraumatic.  Eyes: Scleral icterus is present.  Neck: Neck supple.  Cardiovascular: Normal rate and regular rhythm.   Pulmonary/Chest: Effort normal and breath sounds normal.       Right upper chest wall scar  Lymphadenopathy:    He has no cervical adenopathy.    Data Reviewed Hospital notes.  Assessment    Sickle cell anemia with frequent crises and need for long-term venous access. Right internal jugular vein is thrombosed.    Plan    Ultrasound-guided Port-A-Cath insertion with fluoroscopy. Most likely will need to use the left internal jugular vein or left subclavian vein.  The procedure risks and aftercare been explained. Risks include but are not limited to bleeding,  infection, malfunction, pneumothorax, wound problems, DVT.       Gemayel Mascio J 01/03/2012, 9:25 AM

## 2012-01-04 ENCOUNTER — Encounter (HOSPITAL_COMMUNITY): Payer: Self-pay | Admitting: *Deleted

## 2012-01-04 LAB — CBC WITH DIFFERENTIAL/PLATELET
Basophils Relative: 1 % (ref 0–1)
Eosinophils Relative: 3 % (ref 0–5)
HCT: 23.6 % — ABNORMAL LOW (ref 39.0–52.0)
Hemoglobin: 8.1 g/dL — ABNORMAL LOW (ref 13.0–17.0)
Lymphs Abs: 4.6 10*3/uL — ABNORMAL HIGH (ref 0.7–4.0)
MCV: 86.1 fL (ref 78.0–100.0)
Monocytes Relative: 13 % — ABNORMAL HIGH (ref 3–12)
Neutro Abs: 12.2 10*3/uL — ABNORMAL HIGH (ref 1.7–7.7)
RBC: 2.74 MIL/uL — ABNORMAL LOW (ref 4.22–5.81)
WBC: 20.2 10*3/uL — ABNORMAL HIGH (ref 4.0–10.5)

## 2012-01-04 LAB — COMPREHENSIVE METABOLIC PANEL
Albumin: 3.7 g/dL (ref 3.5–5.2)
Alkaline Phosphatase: 91 U/L (ref 39–117)
BUN: 6 mg/dL (ref 6–23)
Potassium: 3.3 mEq/L — ABNORMAL LOW (ref 3.5–5.1)
Total Protein: 7.5 g/dL (ref 6.0–8.3)

## 2012-01-04 MED ORDER — OXYCODONE HCL 80 MG PO TB12: 80.0000 mg | ORAL_TABLET | Freq: Two times a day (BID) | ORAL | Status: DC

## 2012-01-04 MED ORDER — CEFAZOLIN SODIUM-DEXTROSE 2-3 GM-% IV SOLR
2.0000 g | INTRAVENOUS | Status: AC
  Administered 2012-01-05: 2 g via INTRAVENOUS
  Filled 2012-01-04: qty 50

## 2012-01-04 MED ORDER — ZOLPIDEM TARTRATE 10 MG PO TABS: 10.0000 mg | ORAL_TABLET | Freq: Every evening | ORAL | Status: DC | PRN

## 2012-01-04 MED ORDER — FOLIC ACID 1 MG PO TABS: 1.0000 mg | ORAL_TABLET | Freq: Every day | ORAL | Status: DC

## 2012-01-04 MED ORDER — POTASSIUM CHLORIDE CRYS ER 20 MEQ PO TBCR: 20.0000 meq | EXTENDED_RELEASE_TABLET | Freq: Two times a day (BID) | ORAL | Status: DC

## 2012-01-04 MED ORDER — HYDROMORPHONE HCL 4 MG PO TABS
4.0000 mg | ORAL_TABLET | ORAL | Status: DC | PRN
Start: 2012-01-04 — End: 2012-01-23

## 2012-01-04 MED ORDER — HYDROXYUREA 500 MG PO CAPS: 500.0000 mg | ORAL_CAPSULE | Freq: Two times a day (BID) | ORAL | Status: DC

## 2012-01-04 MED ORDER — LISINOPRIL 10 MG PO TABS: 10.0000 mg | ORAL_TABLET | Freq: Every day | ORAL | Status: DC

## 2012-01-04 NOTE — Discharge Summary (Signed)
[facility] Discharge Summary  Patient ID: [name] MRN: [id number] DOB/AGE: [birth year] 31 y.o.  Admit date: 01/03/2012 Discharge date: 01/04/2012  Admission Diagnoses:  Discharge Diagnoses:  Sickle cell crisis Sickle cell lung disease History of acute chest syndrome Hypokalemia secondary to sickle nephropathy Avascular necrosis of the left shoulder. History of poor venous access.  Discharged Condition: improved  Clinic Course: Patient was hydrated with half-normal saline with potassium replacement. He was given Dilaudid 4 mg IV q.2 hours for pain. He was encouraged uses a spirometry as well. Patient was given Zofran IV and Benadryl for nausea and pruritus respectively. He was maintained on his home medication as well. By the following day he was feeling much better. He did require extra potassium for replacement. His pain however was less than 7/10. He felt he could manage his pain at home. His hemoglobin at time of discharge was 8.1. He was encouraged to take his potassium as directed. He was also advised with potassium rich foods. He will need repeat electrolytes in one week's time.  Patient is also in need of a Port-A-Cath. He is to followup with Dr. Abbey Chatters regarding placement.   Discharge vital signs: Blood pressure 109/71, pulse 81, temperature 98.3 F (36.8 C), temperature source Oral, resp. rate 18, weight 170 lb (77.111 kg), SpO2 98.00%.   Disposition: 01-Home or Self Care  Discharge Orders    Future Orders Please Complete By Expires   Diet - low sodium heart healthy      Increase activity slowly      No wound care      Call MD for:  severe uncontrolled pain      Discharge instructions      Comments:   Follow up with primary care physician in two weeks.       Medication List     As of 01/04/2012 10:23 AM    TAKE these medications         folic acid 1 MG tablet   Commonly known as: FOLVITE   Take 1 tablet (1 mg total) by mouth  daily.      HYDROmorphone 4 MG tablet   Commonly known as: DILAUDID   Take 1 tablet (4 mg total) by mouth every 4 (four) hours as needed for pain. For pain.      hydroxyurea 500 MG capsule   Commonly known as: HYDREA   Take 1 capsule (500 mg total) by mouth 2 (two) times daily. May take with food to minimize GI side effects.      lisinopril 10 MG tablet   Commonly known as: PRINIVIL,ZESTRIL   Take 1 tablet (10 mg total) by mouth daily at 10 pm.      OLANZapine 10 MG tablet   Commonly known as: ZYPREXA   Take 10 mg by mouth at bedtime.      oxyCODONE 80 MG 12 hr tablet   Commonly known as: OXYCONTIN   Take 1 tablet (80 mg total) by mouth every 12 (twelve) hours.      potassium chloride SA 20 MEQ tablet   Commonly known as: K-DUR,KLOR-CON   Take 1 tablet (20 mEq total) by mouth 2 (two) times daily.      zolpidem 10 MG tablet   Commonly known as: AMBIEN   Take 1 tablet (10 mg total) by mouth at bedtime as needed. For sleep         Signed: [name] [name] 01/04/2012, 10:23 AM  Time spent on discharge management  greater than 30 minutes.

## 2012-01-04 NOTE — Progress Notes (Signed)
Pt given lisinopril as prescribed at 2200, but states he has not been diagnosed with high blood pressure, and does not take lisinopril on a daily basis. Pt does have documented history of high blood pressure, but until recently, not prescribed blood pressure medication. Explanation of lisinopril, and high blood pressure discussed with the pt. Pt verbalizes understanding. Pt requested pain, " itching ", and nausea medication . Medicated per written orders. Given po fluid. Appears to be resting at this time with no other requests, or complaints.VS as charted.

## 2012-01-04 NOTE — Progress Notes (Addendum)
Patient ID: [name], male   DOB: 1980/03/10, 31 y.o.   MRN: [id number] IV d/c'd as per order. Scripts for oxycontin 80mg , K-dur , hydroxurea, lisinopril, folic acid, ambien 10mg  and dilaudid po.  Pt in no apparent distress. Left ambulating at 1034

## 2012-01-05 ENCOUNTER — Ambulatory Visit (HOSPITAL_COMMUNITY)
Admission: RE | Admit: 2012-01-05 | Discharge: 2012-01-05 | Disposition: A | Discharge status: Home / Self Care | Payer: Medicare Other | Source: Ambulatory Visit | Attending: General Surgery | Admitting: General Surgery

## 2012-01-05 ENCOUNTER — Ambulatory Visit (HOSPITAL_COMMUNITY): Payer: Medicare Other

## 2012-01-05 ENCOUNTER — Encounter (HOSPITAL_COMMUNITY): Payer: Self-pay | Admitting: Surgery

## 2012-01-05 ENCOUNTER — Encounter (HOSPITAL_COMMUNITY): Payer: Self-pay | Admitting: Anesthesiology

## 2012-01-05 ENCOUNTER — Encounter (HOSPITAL_COMMUNITY)
Admission: RE | Disposition: A | Discharge status: Home / Self Care | Payer: Self-pay | Source: Ambulatory Visit | Attending: General Surgery

## 2012-01-05 ENCOUNTER — Ambulatory Visit (HOSPITAL_COMMUNITY): Payer: Medicare Other | Admitting: Anesthesiology

## 2012-01-05 DIAGNOSIS — I872 Venous insufficiency (chronic) (peripheral): Secondary | ICD-10-CM | POA: Diagnosis not present

## 2012-01-05 DIAGNOSIS — I10 Essential (primary) hypertension: Secondary | ICD-10-CM | POA: Insufficient documentation

## 2012-01-05 DIAGNOSIS — D571 Sickle-cell disease without crisis: Secondary | ICD-10-CM | POA: Diagnosis not present

## 2012-01-05 DIAGNOSIS — Z452 Encounter for adjustment and management of vascular access device: Secondary | ICD-10-CM | POA: Diagnosis not present

## 2012-01-05 HISTORY — PX: PORTACATH PLACEMENT: SHX2246

## 2012-01-05 HISTORY — DX: Essential (primary) hypertension: I10

## 2012-01-05 LAB — SURGICAL PCR SCREEN: Staphylococcus aureus: NEGATIVE

## 2012-01-05 SURGERY — INSERTION, TUNNELED CENTRAL VENOUS DEVICE, WITH PORT
Anesthesia: Monitor Anesthesia Care | Site: Chest | Wound class: Clean

## 2012-01-05 MED ORDER — ACETAMINOPHEN 650 MG RE SUPP: 650.0000 mg | RECTAL | Status: DC | PRN

## 2012-01-05 MED ORDER — MEPERIDINE HCL 25 MG/ML IJ SOLN: 6.2500 mg | INTRAMUSCULAR | Status: DC | PRN

## 2012-01-05 MED ORDER — LACTATED RINGERS IV SOLN
INTRAVENOUS | Status: DC
  Administered 2012-01-05: 11:00:00 via INTRAVENOUS

## 2012-01-05 MED ORDER — SODIUM CHLORIDE 0.9 % IR SOLN
Status: DC | PRN
  Administered 2012-01-05: 11:00:00

## 2012-01-05 MED ORDER — HYDROMORPHONE HCL PF 1 MG/ML IJ SOLN: 0.5000 mg | INTRAMUSCULAR | Status: DC | PRN

## 2012-01-05 MED ORDER — HYDROMORPHONE HCL PF 1 MG/ML IJ SOLN
0.2500 mg | INTRAMUSCULAR | Status: DC | PRN
  Administered 2012-01-05 (×6): 0.5 mg via INTRAVENOUS

## 2012-01-05 MED ORDER — MUPIROCIN 2 % EX OINT
TOPICAL_OINTMENT | CUTANEOUS | Status: AC
  Administered 2012-01-05: 1 via NASAL
  Filled 2012-01-05: qty 22

## 2012-01-05 MED ORDER — HYDROMORPHONE HCL PF 1 MG/ML IJ SOLN
INTRAMUSCULAR | Status: AC
  Filled 2012-01-05: qty 1

## 2012-01-05 MED ORDER — PROPOFOL 10 MG/ML IV BOLUS
INTRAVENOUS | Status: DC | PRN
  Administered 2012-01-05: 120 mg via INTRAVENOUS

## 2012-01-05 MED ORDER — SODIUM CHLORIDE 0.9 % IJ SOLN: 3.0000 mL | INTRAMUSCULAR | Status: DC | PRN

## 2012-01-05 MED ORDER — FENTANYL CITRATE 0.05 MG/ML IJ SOLN
INTRAMUSCULAR | Status: DC | PRN
  Administered 2012-01-05: 100 ug via INTRAVENOUS

## 2012-01-05 MED ORDER — ACETAMINOPHEN 325 MG PO TABS: 650.0000 mg | ORAL_TABLET | ORAL | Status: DC | PRN

## 2012-01-05 MED ORDER — HEPARIN SOD (PORK) LOCK FLUSH 100 UNIT/ML IV SOLN
INTRAVENOUS | Status: AC
  Filled 2012-01-05: qty 5

## 2012-01-05 MED ORDER — ONDANSETRON HCL 4 MG/2ML IJ SOLN: 4.0000 mg | Freq: Four times a day (QID) | INTRAMUSCULAR | Status: DC | PRN

## 2012-01-05 MED ORDER — ONDANSETRON HCL 4 MG/2ML IJ SOLN
INTRAMUSCULAR | Status: DC | PRN
  Administered 2012-01-05: 4 mg via INTRAVENOUS

## 2012-01-05 MED ORDER — MIDAZOLAM HCL 2 MG/2ML IJ SOLN: 0.5000 mg | Freq: Once | INTRAMUSCULAR | Status: DC | PRN

## 2012-01-05 MED ORDER — LACTATED RINGERS IV SOLN
INTRAVENOUS | Status: DC | PRN
  Administered 2012-01-05: 11:00:00 via INTRAVENOUS

## 2012-01-05 MED ORDER — OXYCODONE HCL 5 MG PO TABS: 5.0000 mg | ORAL_TABLET | Freq: Once | ORAL | Status: DC | PRN

## 2012-01-05 MED ORDER — HEPARIN SOD (PORK) LOCK FLUSH 100 UNIT/ML IV SOLN
INTRAVENOUS | Status: DC | PRN
  Administered 2012-01-05: 200 [IU] via INTRAVENOUS

## 2012-01-05 MED ORDER — ARTIFICIAL TEARS OP OINT
TOPICAL_OINTMENT | OPHTHALMIC | Status: DC | PRN
  Administered 2012-01-05: 1 via OPHTHALMIC

## 2012-01-05 MED ORDER — MIDAZOLAM HCL 5 MG/5ML IJ SOLN
INTRAMUSCULAR | Status: DC | PRN
  Administered 2012-01-05: 2 mg via INTRAVENOUS

## 2012-01-05 MED ORDER — OXYCODONE HCL 5 MG/5ML PO SOLN: 5.0000 mg | Freq: Once | ORAL | Status: DC | PRN

## 2012-01-05 MED ORDER — PROMETHAZINE HCL 25 MG/ML IJ SOLN: 6.2500 mg | INTRAMUSCULAR | Status: DC | PRN

## 2012-01-05 MED ORDER — LIDOCAINE HCL (PF) 1 % IJ SOLN
INTRAMUSCULAR | Status: DC | PRN
  Administered 2012-01-05: 20 mL

## 2012-01-05 MED ORDER — OXYCODONE HCL 5 MG PO TABS: 5.0000 mg | ORAL_TABLET | ORAL | Status: DC | PRN

## 2012-01-05 MED ORDER — PHENYLEPHRINE HCL 10 MG/ML IJ SOLN
INTRAMUSCULAR | Status: DC | PRN
  Administered 2012-01-05: 80 ug via INTRAVENOUS

## 2012-01-05 MED ORDER — MUPIROCIN 2 % EX OINT
TOPICAL_OINTMENT | Freq: Two times a day (BID) | CUTANEOUS | Status: DC
  Administered 2012-01-05: 1 via NASAL
  Filled 2012-01-05: qty 22

## 2012-01-05 MED ORDER — LIDOCAINE HCL (PF) 1 % IJ SOLN
INTRAMUSCULAR | Status: AC
  Filled 2012-01-05: qty 30

## 2012-01-05 SURGICAL SUPPLY — 60 items
APL SKNCLS STERI-STRIP NONHPOA (GAUZE/BANDAGES/DRESSINGS) ×1
BAG DECANTER FOR FLEXI CONT (MISCELLANEOUS) ×2 IMPLANT
BENZOIN TINCTURE PRP APPL 2/3 (GAUZE/BANDAGES/DRESSINGS) ×2 IMPLANT
BLADE SURG 11 STRL SS (BLADE) ×2 IMPLANT
BLADE SURG 15 STRL LF DISP TIS (BLADE) ×1 IMPLANT
BLADE SURG 15 STRL SS (BLADE) ×2
CHLORAPREP W/TINT 26ML (MISCELLANEOUS) ×2 IMPLANT
CLOTH BEACON ORANGE TIMEOUT ST (SAFETY) ×2 IMPLANT
CLSR STERI-STRIP ANTIMIC 1/2X4 (GAUZE/BANDAGES/DRESSINGS) ×2 IMPLANT
COVER PROBE W GEL 5X96 (DRAPES) ×2 IMPLANT
COVER SURGICAL LIGHT HANDLE (MISCELLANEOUS) ×2 IMPLANT
CRADLE DONUT ADULT HEAD (MISCELLANEOUS) ×2 IMPLANT
DECANTER SPIKE VIAL GLASS SM (MISCELLANEOUS) ×2 IMPLANT
DRAPE C-ARM 42X72 X-RAY (DRAPES) ×2 IMPLANT
DRAPE LAPAROSCOPIC ABDOMINAL (DRAPES) ×2 IMPLANT
DRSG OPSITE 4X5.5 SM (GAUZE/BANDAGES/DRESSINGS) ×2 IMPLANT
DRSG TEGADERM 4X4.75 (GAUZE/BANDAGES/DRESSINGS) ×1 IMPLANT
ELECT CAUTERY BLADE 6.4 (BLADE) ×2 IMPLANT
ELECT REM PT RETURN 9FT ADLT (ELECTROSURGICAL) ×2
ELECTRODE REM PT RTRN 9FT ADLT (ELECTROSURGICAL) ×1 IMPLANT
GAUZE SPONGE 2X2 8PLY STRL LF (GAUZE/BANDAGES/DRESSINGS) ×1 IMPLANT
GAUZE SPONGE 4X4 16PLY XRAY LF (GAUZE/BANDAGES/DRESSINGS) ×2 IMPLANT
GLOVE BIOGEL M 6.5 STRL (GLOVE) ×2 IMPLANT
GLOVE BIOGEL PI IND STRL 7.0 (GLOVE) ×1 IMPLANT
GLOVE BIOGEL PI IND STRL 8 (GLOVE) ×1 IMPLANT
GLOVE BIOGEL PI INDICATOR 7.0 (GLOVE) ×1
GLOVE BIOGEL PI INDICATOR 8 (GLOVE) ×1
GLOVE ECLIPSE 8.0 STRL XLNG CF (GLOVE) ×2 IMPLANT
GOWN STRL NON-REIN LRG LVL3 (GOWN DISPOSABLE) ×4 IMPLANT
INTRODUCER 13FR (MISCELLANEOUS) IMPLANT
INTRODUCER COOK 11FR (CATHETERS) IMPLANT
KIT BASIN OR (CUSTOM PROCEDURE TRAY) ×2 IMPLANT
KIT PORT POWER 9.6FR MRI PREA (Catheter) IMPLANT
KIT PORT POWER ISP 8FR (Catheter) IMPLANT
KIT POWER CATH 8FR (Catheter) ×1 IMPLANT
KIT ROOM TURNOVER OR (KITS) ×2 IMPLANT
NEEDLE 22X1 1/2 (OR ONLY) (NEEDLE) ×2 IMPLANT
NEEDLE HYPO 25GX1X1/2 BEV (NEEDLE) ×2 IMPLANT
NS IRRIG 1000ML POUR BTL (IV SOLUTION) ×2 IMPLANT
PACK SURGICAL SETUP 50X90 (CUSTOM PROCEDURE TRAY) ×2 IMPLANT
PAD ARMBOARD 7.5X6 YLW CONV (MISCELLANEOUS) ×4 IMPLANT
PENCIL BUTTON HOLSTER BLD 10FT (ELECTRODE) ×2 IMPLANT
SET INTRODUCER 12FR PACEMAKER (SHEATH) IMPLANT
SET SHEATH INTRODUCER 10FR (MISCELLANEOUS) IMPLANT
SHEATH COOK PEEL AWAY SET 9F (SHEATH) IMPLANT
SPONGE GAUZE 2X2 STER 10/PKG (GAUZE/BANDAGES/DRESSINGS) ×1
SPONGE GAUZE 4X4 12PLY (GAUZE/BANDAGES/DRESSINGS) ×1 IMPLANT
STRIP CLOSURE SKIN 1/4X4 (GAUZE/BANDAGES/DRESSINGS) ×2 IMPLANT
SUT MON AB 4-0 PC3 18 (SUTURE) ×2 IMPLANT
SUT VIC AB 2-0 SH 18 (SUTURE) ×2 IMPLANT
SUT VIC AB 3-0 54X BRD REEL (SUTURE) IMPLANT
SUT VIC AB 3-0 BRD 54 (SUTURE)
SUT VIC AB 3-0 SH 27 (SUTURE)
SUT VIC AB 3-0 SH 27X BRD (SUTURE) IMPLANT
SYR 20ML ECCENTRIC (SYRINGE) ×4 IMPLANT
SYR 5ML LUER SLIP (SYRINGE) ×2 IMPLANT
SYR CONTROL 10ML LL (SYRINGE) ×2 IMPLANT
TOWEL OR 17X24 6PK STRL BLUE (TOWEL DISPOSABLE) ×2 IMPLANT
TOWEL OR 17X26 10 PK STRL BLUE (TOWEL DISPOSABLE) ×2 IMPLANT
WATER STERILE IRR 1000ML POUR (IV SOLUTION) IMPLANT

## 2012-01-05 NOTE — Progress Notes (Signed)
After multiple unsuccessful attempts to obtain ordered lab work, Nurse called Dr. Jean Rosenthal and notified her of this. Dr. Jean Rosenthal stated to send patient to holding area and lab work would be obtained after IV placement. Will update patient on plan of care.

## 2012-01-05 NOTE — Preoperative (Signed)
Beta Blockers   Reason not to administer Beta Blockers:Not Applicable 

## 2012-01-05 NOTE — Op Note (Signed)
Preoperative diagnosis:  Sickle cell anemia with poor venous access  Postoperative diagnosis:  Same  Procedure:  Port-A-Cath insertion into left subclavian vein under fluoroscopy.  Surgeon: Avel Peace M.D.  Anesthesia: Local (Xylocaine) with General/LMA.  Indication:  This is a 32 year old male with sickle cell anemia. He has multiple sickle crises leading to hospitalizations. He has poor venous access and thus presents for Port-A-Cath insertion.  Technique: He was brought to the operating room, placed supine on the operating table, and intravenous sedation was given. A roll was placed under the back and the arms were tucked. Hair on the chest wall was clipped as was necessary. The left upper chest wall and neck were sterilely prepped and draped.   Local anesthetic was infiltrated in the skin and subcutaneous tissue just inferior to the left clavicle. A 16-gauge needle was used to cannulate the left subclavian vein . A wire was then threaded through the needle into the eft subclavian vein and down into the right heart under fluoroscopic guidance.  Local anesthetic was infiltrated into the left upper chest wall. A left upper chest wall incision was made and a pocket was created for the Portacath.  An incision was made around the wire. The catheter was then tunneled from the chest wall incision up through the subclavian incision.  A dilator- introducer complex was placed over the wire into the superior vena cava. The dilator and wire were then removed and the catheter was threaded through the peel-away sheath introducer into the right heart. The introducer was then peeled away and removed. Under fluoroscopic guidance, the tip of the catheter was then pulled back until it was at the junction of the superior vena cava and right atrium. The catheter was then connected to the port.  The port aspirated blood and flushed easily.  The port was then anchored to the chest wall with 2-0 Vicryl suture.  Concentrated heparin solution was then placed into the port. The port and catheter position were then verified using fluoroscopy. The subcutaneous tissue was then closed over the port with running 3-0 Vicryl suture. The skin incisions were then closed with 4-0 Monocryl subcuticular stitches. Steri-Strips and sterile dressings were applied.  The procedure was well-tolerated without any apparent complications. The patient was taken to the recovery room in satisfactory condition where a portable chest x-ray is pending.

## 2012-01-05 NOTE — Anesthesia Postprocedure Evaluation (Signed)
  Anesthesia Post-op Note  Patient: [name]  Procedure(s) Performed: Procedure(s) (LRB) with comments: INSERTION PORT-A-CATH (N/A) - ultrasound guiced port a cath insertion with fluoroscopy  Patient Location: PACU  Anesthesia Type:General  Level of Consciousness: awake, alert , oriented and patient cooperative  Airway and Oxygen Therapy: Patient Spontanous Breathing  Post-op Pain: mild  Post-op Assessment: Post-op Vital signs reviewed, Patient's Cardiovascular Status Stable, Respiratory Function Stable, Patent Airway, No signs of Nausea or vomiting and Pain level controlled  Post-op Vital Signs: Reviewed and stable  Complications: No apparent anesthesia complications

## 2012-01-05 NOTE — Progress Notes (Signed)
Received report

## 2012-01-05 NOTE — Transfer of Care (Signed)
Immediate Anesthesia Transfer of Care Note  Patient: [name]  Procedure(s) Performed: Procedure(s) (LRB) with comments: INSERTION PORT-A-CATH (N/A) - ultrasound guiced port a cath insertion with fluoroscopy  Patient Location: PACU  Anesthesia Type:General  Level of Consciousness: awake, alert , oriented and patient cooperative  Airway & Oxygen Therapy: Patient Spontanous Breathing and Patient connected to nasal cannula oxygen  Post-op Assessment: Report given to PACU RN, Post -op Vital signs reviewed and stable and Patient moving all extremities  Post vital signs: Reviewed and stable  Complications: No apparent anesthesia complications

## 2012-01-05 NOTE — Interval H&P Note (Signed)
History and Physical Interval Note:  01/05/2012 10:31 AM  [name]  has presented today for surgery, with the diagnosis of sickle cell anemia  The various methods of treatment have been discussed with the patient and family. After consideration of risks, benefits and other options for treatment, the patient has consented to  Procedure(s) (LRB) with comments: INSERTION PORT-A-CATH (N/A) - ultrasound guiced port a cath insertion with fluoroscopy as a surgical intervention .  The patient's history has been reviewed, patient examined, no change in status, stable for surgery.  I have reviewed the patient's chart and labs.  Questions were answered to the patient's satisfaction.     Ritchie Klee Shela Commons

## 2012-01-05 NOTE — H&P (View-Only) (Signed)
Patient ID: [name], male   DOB: 10/09/1980, 31 y.o.   MRN: [id number]  Chief Complaint  Patient presents with  . New Evaluation    PAC     HPI [name] is a 31 y.o. male.   HPI  He is referred by Dr. Dean for Port-A-Cath placement for long-term venous access. He has sickle cell anemia and has a crisis about every 2 months it requires him to go to the hospital. There is difficulty with vascular access. He had a previous Port-A-Cath in on the right side but it became infected. He also had thrombosis of the right internal jugular vein.  Past Medical History  Diagnosis Date  . Sickle cell anemia   . Blood transfusion   . 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 Hypokalemia   . Mood disorder   . Pulmonary embolism   . Avascular necrosis   . Leukocytosis     Chronic  . Thrombocytosis     Chronic    Past Surgical History  Procedure Date  . Right hip replacement     08/2006  . Cholecystectomy     01/2008  . Porta cath placement   . Porta cath removal   . Umbilical hernia repair     01/2008  . Excision of left periauricular cyst     10/2009  . Excision of right ear lobe cyst with primary closur     11/2007    Family History  Problem Relation Age of Onset  . Sickle cell anemia Mother   . Sickle cell anemia Father   . Sickle cell trait Brother     Social History History  Substance Use Topics  . Smoking status: Former Smoker -- 13 years    Types: Cigarettes    Quit date: 07/08/2010  . Smokeless tobacco: Never Used  . Alcohol Use: No    Allergies  Allergen Reactions  . Morphine And Related Hives and Rash    Pt states he is able to tolerate Dilaudid with no reactions.    Current Outpatient Prescriptions  Medication Sig Dispense Refill  . folic acid (FOLVITE) 1 MG tablet Take 1 mg by mouth daily.      . HYDROmorphone (DILAUDID) 4 MG tablet Take 1 tablet (4 mg total) by mouth every 4 (four) hours as needed for pain. For  pain.  30 tablet  0  . hydroxyurea (HYDREA) 500 MG capsule Take 500 mg by mouth 2 (two) times daily. May take with food to minimize GI side effects.      . lisinopril (PRINIVIL,ZESTRIL) 10 MG tablet Take 1 tablet (10 mg total) by mouth daily at 10 pm.  30 tablet  2  . oxyCODONE (OXYCONTIN) 80 MG 12 hr tablet Take 1 tablet (80 mg total) by mouth every 12 (twelve) hours.  30 tablet  0  . potassium chloride SA (K-DUR,KLOR-CON) 20 MEQ tablet Take 1 tablet (20 mEq total) by mouth 2 (two) times daily.  60 tablet  1  . zolpidem (AMBIEN) 10 MG tablet       . deferasirox (EXJADE) 500 MG disintegrating tablet Take 1 tablet (500 mg total) by mouth daily before breakfast.  30 tablet  2  . OLANZapine (ZYPREXA) 10 MG tablet Take 10 mg by mouth at bedtime.        Review of Systems Review of Systems  Constitutional: Negative for fever, chills and unexpected weight change.  Respiratory: Negative.   Cardiovascular:   Negative.   Gastrointestinal: Negative.     Blood pressure 106/70, pulse 85, temperature 98 F (36.7 C), temperature source Temporal, resp. rate 18, height 6' (1.829 m), weight 170 lb (77.111 kg).  Physical Exam Physical Exam  Constitutional: No distress.       Thin male  HENT:  Head: Normocephalic and atraumatic.  Eyes: Scleral icterus is present.  Neck: Neck supple.  Cardiovascular: Normal rate and regular rhythm.   Pulmonary/Chest: Effort normal and breath sounds normal.       Right upper chest wall scar  Lymphadenopathy:    He has no cervical adenopathy.    Data Reviewed Hospital notes.  Assessment    Sickle cell anemia with frequent crises and need for long-term venous access. Right internal jugular vein is thrombosed.    Plan    Ultrasound-guided Port-A-Cath insertion with fluoroscopy. Most likely will need to use the left internal jugular vein or left subclavian vein.  The procedure risks and aftercare been explained. Risks include but are not limited to bleeding,  infection, malfunction, pneumothorax, wound problems, DVT.       Chevette Fee J 01/03/2012, 9:25 AM    

## 2012-01-05 NOTE — Anesthesia Preprocedure Evaluation (Addendum)
Anesthesia Evaluation  Patient identified by MRN, date of birth, ID band Patient awake    Reviewed: Allergy & Precautions, H&P , NPO status , Patient's Chart, lab work & pertinent test results  History of Anesthesia Complications Negative for: history of anesthetic complications  Airway Mallampati: I TM Distance: >3 FB Neck ROM: Full    Dental  (+) Teeth Intact and Dental Advisory Given   Pulmonary former smoker, PE (years ago, no sequelae) breath sounds clear to auscultation  Pulmonary exam normal       Cardiovascular hypertension, Pt. on medications Rhythm:Regular Rate:Normal  3/13 ECHO: normal LVF, EF 60-65%, valves OK   Neuro/Psych PSYCHIATRIC DISORDERS negative neurological ROS     GI/Hepatic negative GI ROS, Neg liver ROS,   Endo/Other  negative endocrine ROS  Renal/GU negative Renal ROS     Musculoskeletal negative musculoskeletal ROS (+)   Abdominal   Peds  Hematology  (+) Blood dyscrasia (Hb 8.1, Chronic pain: narcotics), Sickle cell anemia ,   Anesthesia Other Findings   Reproductive/Obstetrics                          Anesthesia Physical Anesthesia Plan  ASA: III  Anesthesia Plan: General   Post-op Pain Management:    Induction: Intravenous  Airway Management Planned: LMA  Additional Equipment:   Intra-op Plan:   Post-operative Plan:   Informed Consent: I have reviewed the patients History and Physical, chart, labs and discussed the procedure including the risks, benefits and alternatives for the proposed anesthesia with the patient or authorized representative who has indicated his/her understanding and acceptance.   Dental advisory given  Plan Discussed with: CRNA and Surgeon  Anesthesia Plan Comments: (Plan routine monitors GA- LMA OK)        Anesthesia Quick Evaluation

## 2012-01-05 NOTE — Anesthesia Procedure Notes (Signed)
Procedure Name: LMA Insertion Date/Time: 01/05/2012 11:06 AM Performed by: Jerilee Hoh Pre-anesthesia Checklist: Patient identified, Emergency Drugs available, Suction available and Patient being monitored Patient Re-evaluated:Patient Re-evaluated prior to inductionOxygen Delivery Method: Circle system utilized Preoxygenation: Pre-oxygenation with 100% oxygen Intubation Type: IV induction LMA: LMA inserted LMA Size: 5.0 Tube type: Oral Number of attempts: 1 Placement Confirmation: positive ETCO2 and breath sounds checked- equal and bilateral Tube secured with: Tape Dental Injury: Teeth and Oropharynx as per pre-operative assessment

## 2012-01-06 ENCOUNTER — Telehealth (INDEPENDENT_AMBULATORY_CARE_PROVIDER_SITE_OTHER): Payer: Self-pay

## 2012-01-06 ENCOUNTER — Encounter (HOSPITAL_COMMUNITY): Payer: Self-pay | Admitting: General Surgery

## 2012-01-06 NOTE — Telephone Encounter (Signed)
I called the pt to check on him and offer a post op appointment.  He is doing well just a little sore.  He states Dr Abbey Chatters said he didn't need to come back unless there are problems.  I left his appointment open and asked him to call us if he has problems.

## 2012-01-10 IMAGING — CR DG CHEST 2V
2 series · 2 of 2 positions shown · non-contrast
Comparison: 01/13/2009

CLINICAL DATA: Sickle cell disease with chest pain.

CHEST - 2 VIEW

[w chest pa]
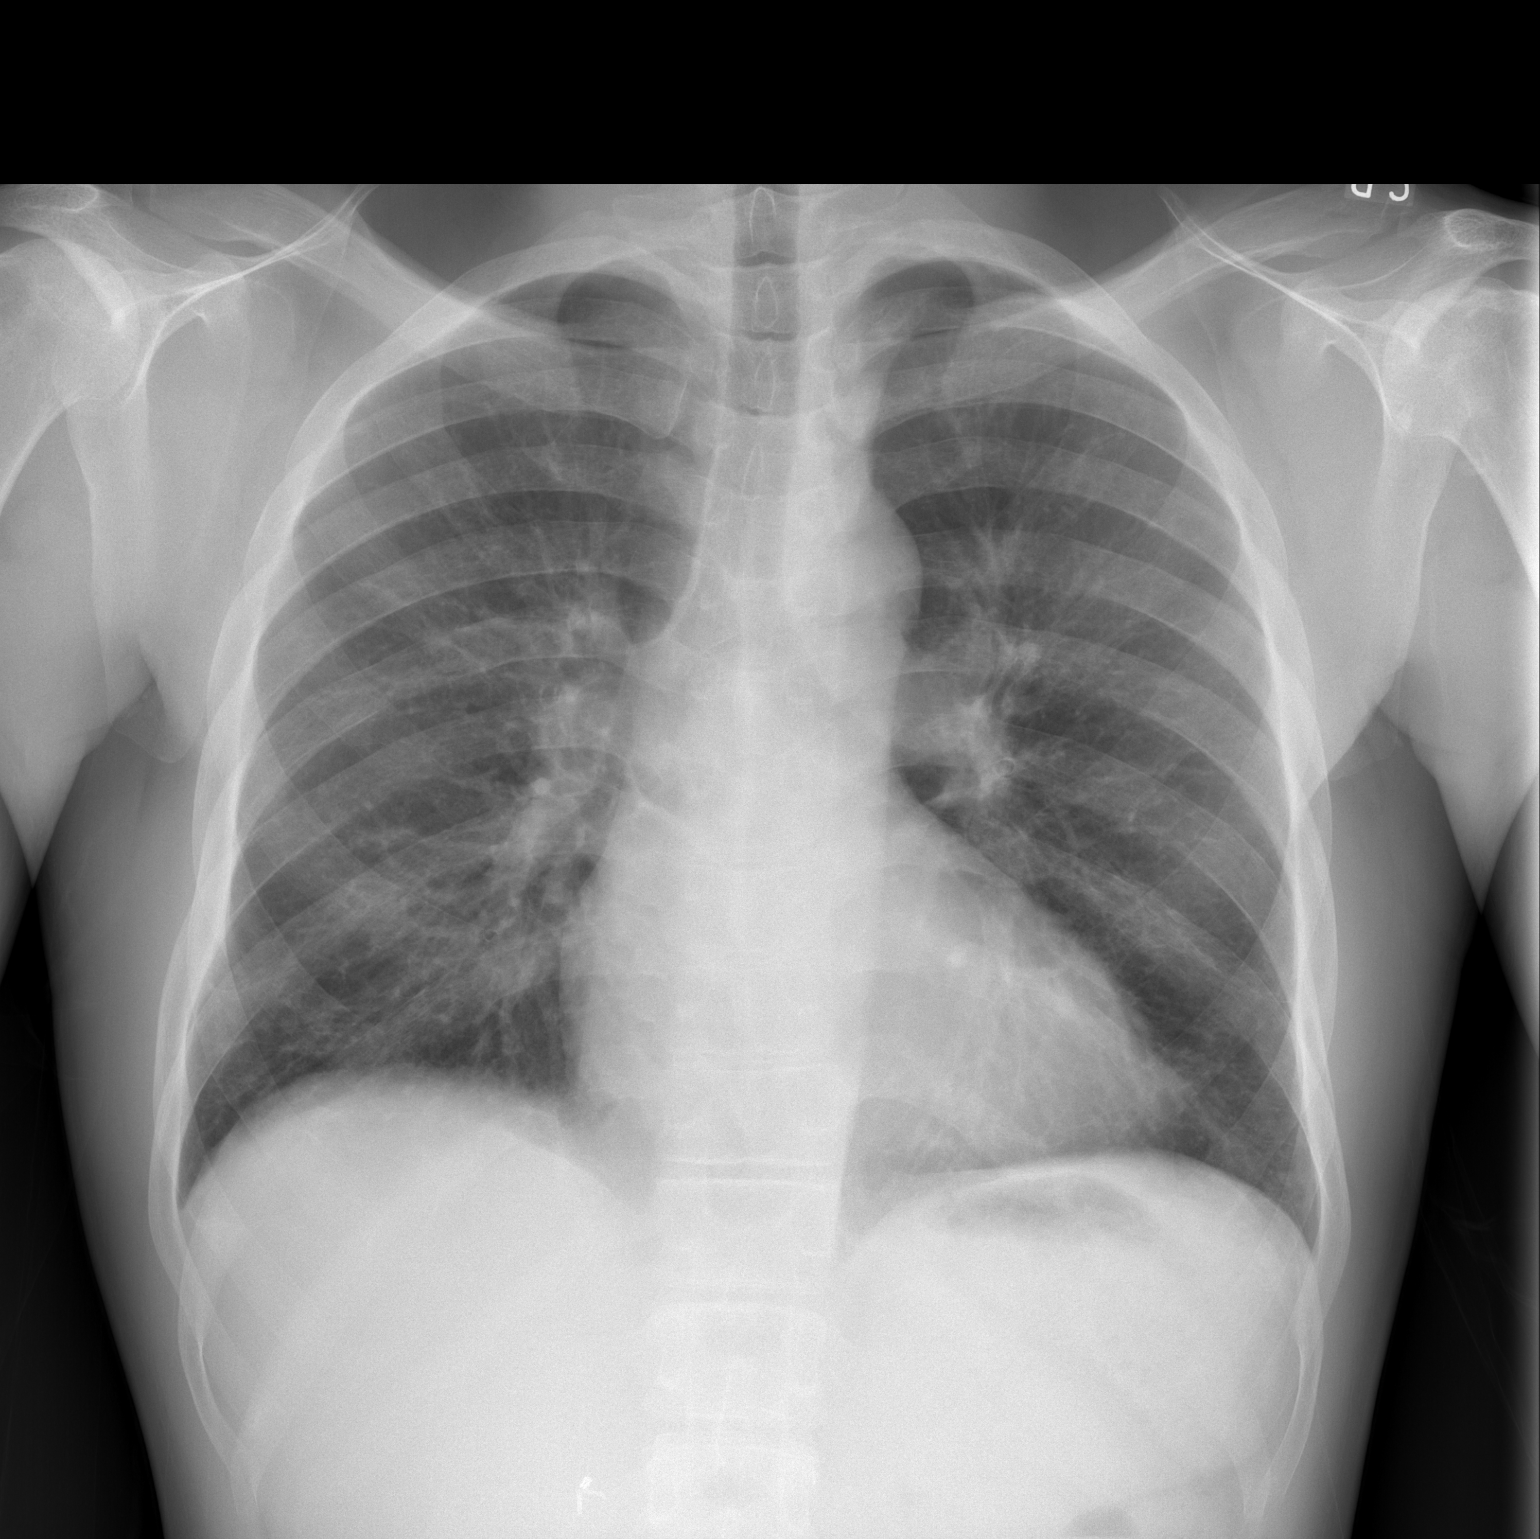

[w chest lat]
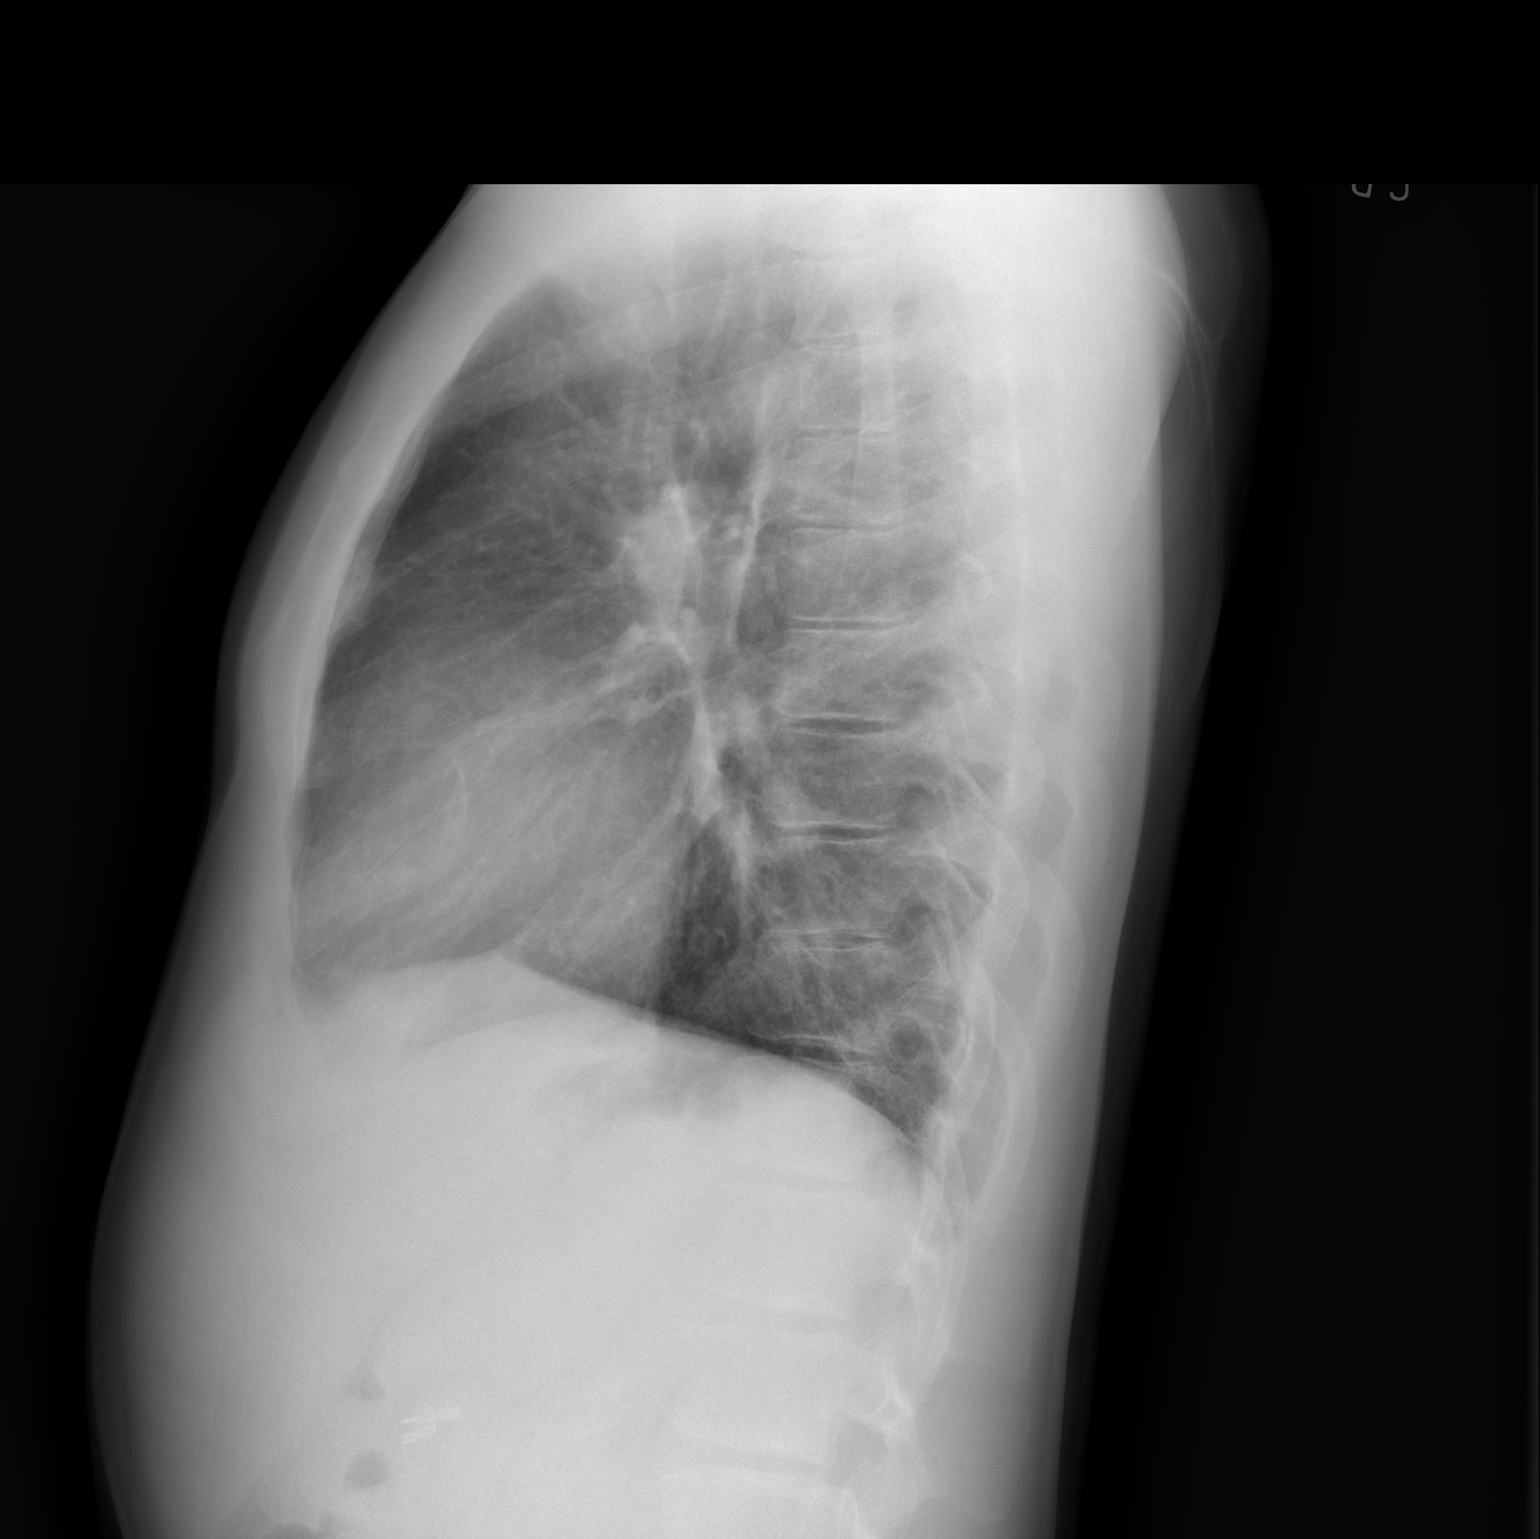

[2 of 2 positions shown; findings below may reference images not displayed]

FINDINGS: The cardiac silhouette, mediastinal and hilar contours
are stable.  There are mild bronchitic type lung changes and
minimal central vascular congestion.  No focal infiltrates or
edema.  No pleural effusions.  Stable changes of probable AVN
involving the left shoulder.
IMPRESSION: Mild vascular congestion and chronic bronchitic type changes.  No
infiltrates, edema or effusions.

## 2012-01-13 ENCOUNTER — Inpatient Hospital Stay (HOSPITAL_COMMUNITY)
Admission: AD | Admit: 2012-01-13 | Discharge: 2012-01-23 | DRG: 812 | Disposition: A | Discharge status: Home / Self Care | Payer: Medicare Other | Attending: Internal Medicine | Admitting: Internal Medicine

## 2012-01-13 ENCOUNTER — Telehealth (HOSPITAL_COMMUNITY): Payer: Self-pay | Admitting: Hematology

## 2012-01-13 ENCOUNTER — Encounter (HOSPITAL_COMMUNITY): Payer: Self-pay | Admitting: Hematology

## 2012-01-13 DIAGNOSIS — E876 Hypokalemia: Secondary | ICD-10-CM | POA: Diagnosis not present

## 2012-01-13 DIAGNOSIS — R17 Unspecified jaundice: Secondary | ICD-10-CM | POA: Diagnosis present

## 2012-01-13 DIAGNOSIS — G8929 Other chronic pain: Secondary | ICD-10-CM | POA: Diagnosis not present

## 2012-01-13 DIAGNOSIS — R1110 Vomiting, unspecified: Secondary | ICD-10-CM

## 2012-01-13 DIAGNOSIS — K5900 Constipation, unspecified: Secondary | ICD-10-CM | POA: Diagnosis present

## 2012-01-13 DIAGNOSIS — D5700 Hb-SS disease with crisis, unspecified: Principal | ICD-10-CM | POA: Diagnosis present

## 2012-01-13 DIAGNOSIS — J40 Bronchitis, not specified as acute or chronic: Secondary | ICD-10-CM

## 2012-01-13 DIAGNOSIS — R079 Chest pain, unspecified: Secondary | ICD-10-CM | POA: Diagnosis not present

## 2012-01-13 DIAGNOSIS — R1013 Epigastric pain: Secondary | ICD-10-CM | POA: Diagnosis not present

## 2012-01-13 DIAGNOSIS — I10 Essential (primary) hypertension: Secondary | ICD-10-CM | POA: Diagnosis present

## 2012-01-13 DIAGNOSIS — D72829 Elevated white blood cell count, unspecified: Secondary | ICD-10-CM | POA: Diagnosis present

## 2012-01-13 DIAGNOSIS — D5701 Hb-SS disease with acute chest syndrome: Secondary | ICD-10-CM

## 2012-01-13 DIAGNOSIS — D571 Sickle-cell disease without crisis: Secondary | ICD-10-CM | POA: Diagnosis present

## 2012-01-13 DIAGNOSIS — F39 Unspecified mood [affective] disorder: Secondary | ICD-10-CM | POA: Diagnosis not present

## 2012-01-13 DIAGNOSIS — D57819 Other sickle-cell disorders with crisis, unspecified: Secondary | ICD-10-CM | POA: Diagnosis not present

## 2012-01-13 DIAGNOSIS — M8700 Idiopathic aseptic necrosis of unspecified bone: Secondary | ICD-10-CM

## 2012-01-13 LAB — COMPREHENSIVE METABOLIC PANEL
Alkaline Phosphatase: 90 U/L (ref 39–117)
BUN: 5 mg/dL — ABNORMAL LOW (ref 6–23)
Calcium: 8.9 mg/dL (ref 8.4–10.5)
GFR calc Af Amer: >90 mL/min (ref >90)
Glucose, Bld: 89 mg/dL (ref 70–99)
Total Protein: 7.2 g/dL (ref 6.0–8.3)

## 2012-01-13 LAB — RETICULOCYTES
Retic Count, Absolute: 335.3 10*3/uL — ABNORMAL HIGH (ref 19.0–186.0)
Retic Ct Pct: 13.8 % — ABNORMAL HIGH (ref 0.4–3.1)

## 2012-01-13 LAB — CBC WITH DIFFERENTIAL/PLATELET
Basophils Absolute: 0.2 10*3/uL — ABNORMAL HIGH (ref 0.0–0.1)
Basophils Relative: 1 % (ref 0–1)
Eosinophils Absolute: 0.5 10*3/uL (ref 0.0–0.7)
Eosinophils Relative: 3 % (ref 0–5)
Lymphocytes Relative: 21 % (ref 12–46)
MCHC: 34.4 g/dL (ref 30.0–36.0)
MCV: 88.5 fL (ref 78.0–100.0)
Platelets: 504 10*3/uL — ABNORMAL HIGH (ref 150–400)
RDW: 21.3 % — ABNORMAL HIGH (ref 11.5–15.5)
WBC: 16.2 10*3/uL — ABNORMAL HIGH (ref 4.0–10.5)

## 2012-01-13 MED ORDER — LISINOPRIL 10 MG PO TABS
10.0000 mg | ORAL_TABLET | Freq: Every day | ORAL | Status: DC
  Administered 2012-01-13 – 2012-01-23 (×10): 10 mg via ORAL
  Filled 2012-01-13 (×11): qty 1

## 2012-01-13 MED ORDER — POTASSIUM CHLORIDE CRYS ER 20 MEQ PO TBCR
20.0000 meq | EXTENDED_RELEASE_TABLET | Freq: Two times a day (BID) | ORAL | Status: DC
  Administered 2012-01-13 – 2012-01-23 (×20): 20 meq via ORAL
  Filled 2012-01-13 (×22): qty 1

## 2012-01-13 MED ORDER — DIPHENHYDRAMINE HCL 25 MG PO CAPS
25.0000 mg | ORAL_CAPSULE | ORAL | Status: DC | PRN
  Administered 2012-01-14 (×2): 50 mg via ORAL
  Administered 2012-01-15 (×2): 25 mg via ORAL
  Filled 2012-01-13 (×2): qty 1
  Filled 2012-01-13 (×2): qty 2

## 2012-01-13 MED ORDER — FOLIC ACID 1 MG PO TABS
1.0000 mg | ORAL_TABLET | Freq: Every day | ORAL | Status: DC
  Administered 2012-01-14 – 2012-01-23 (×10): 1 mg via ORAL
  Filled 2012-01-13 (×10): qty 1

## 2012-01-13 MED ORDER — DEXTROSE-NACL 5-0.45 % IV SOLN
INTRAVENOUS | Status: DC
  Administered 2012-01-13: 12:00:00 via INTRAVENOUS

## 2012-01-13 MED ORDER — POTASSIUM CHLORIDE IN NACL 20-0.45 MEQ/L-% IV SOLN
INTRAVENOUS | Status: DC
  Administered 2012-01-13 – 2012-01-14 (×2): via INTRAVENOUS
  Filled 2012-01-13 (×3): qty 1000

## 2012-01-13 MED ORDER — AZITHROMYCIN 250 MG PO TABS
250.0000 mg | ORAL_TABLET | Freq: Every day | ORAL | Status: AC
  Administered 2012-01-14 – 2012-01-17 (×4): 250 mg via ORAL
  Filled 2012-01-13 (×4): qty 1

## 2012-01-13 MED ORDER — OXYCODONE HCL ER 80 MG PO T12A
80.0000 mg | EXTENDED_RELEASE_TABLET | Freq: Two times a day (BID) | ORAL | Status: DC
  Administered 2012-01-13 – 2012-01-14 (×2): 80 mg via ORAL
  Filled 2012-01-13 (×2): qty 1

## 2012-01-13 MED ORDER — HYDROXYUREA 500 MG PO CAPS
500.0000 mg | ORAL_CAPSULE | Freq: Two times a day (BID) | ORAL | Status: DC
  Administered 2012-01-13 – 2012-01-17 (×9): 500 mg via ORAL
  Filled 2012-01-13 (×12): qty 1

## 2012-01-13 MED ORDER — ZOLPIDEM TARTRATE 10 MG PO TABS
10.0000 mg | ORAL_TABLET | Freq: Every evening | ORAL | Status: DC | PRN
  Administered 2012-01-14 – 2012-01-22 (×4): 10 mg via ORAL
  Filled 2012-01-13 (×4): qty 1

## 2012-01-13 MED ORDER — OLANZAPINE 10 MG PO TABS
10.0000 mg | ORAL_TABLET | Freq: Every day | ORAL | Status: DC
  Administered 2012-01-13 – 2012-01-22 (×10): 10 mg via ORAL
  Filled 2012-01-13 (×11): qty 1

## 2012-01-13 MED ORDER — AZITHROMYCIN 500 MG PO TABS
500.0000 mg | ORAL_TABLET | Freq: Every day | ORAL | Status: AC
  Administered 2012-01-13: 500 mg via ORAL
  Filled 2012-01-13: qty 1

## 2012-01-13 MED ORDER — ONDANSETRON HCL 4 MG/2ML IJ SOLN
4.0000 mg | INTRAMUSCULAR | Status: DC | PRN
  Administered 2012-01-13 – 2012-01-23 (×33): 4 mg via INTRAVENOUS
  Filled 2012-01-13 (×33): qty 2

## 2012-01-13 MED ORDER — HYDROMORPHONE HCL PF 2 MG/ML IJ SOLN
2.0000 mg | INTRAMUSCULAR | Status: DC
  Administered 2012-01-13 – 2012-01-14 (×7): 4 mg via INTRAVENOUS
  Administered 2012-01-14: 2 mg via INTRAVENOUS
  Administered 2012-01-14 – 2012-01-15 (×12): 4 mg via INTRAVENOUS
  Administered 2012-01-15: 2 mg via INTRAVENOUS
  Administered 2012-01-15: 4 mg via INTRAVENOUS
  Filled 2012-01-13 (×22): qty 2

## 2012-01-13 MED ORDER — DIPHENHYDRAMINE HCL 50 MG/ML IJ SOLN
12.5000 mg | INTRAMUSCULAR | Status: DC | PRN
  Administered 2012-01-13 – 2012-01-14 (×6): 25 mg via INTRAVENOUS
  Filled 2012-01-13 (×6): qty 1

## 2012-01-13 MED ORDER — ONDANSETRON HCL 4 MG PO TABS
4.0000 mg | ORAL_TABLET | ORAL | Status: DC | PRN
  Administered 2012-01-14 – 2012-01-16 (×4): 4 mg via ORAL
  Filled 2012-01-13 (×4): qty 1

## 2012-01-13 MED ORDER — HYDROMORPHONE HCL PF 2 MG/ML IJ SOLN
2.0000 mg | INTRAMUSCULAR | Status: DC | PRN
  Administered 2012-01-13 (×4): 4 mg via INTRAVENOUS
  Filled 2012-01-13 (×4): qty 2

## 2012-01-13 NOTE — H&P (Signed)
[facility]  Patient ID: [name], male   DOB: August 17, 1980, 31 y.o.   MRN: [id number]  No chief complaint on file.   HPI [name] is a 31 y.o. male.  Patient comes to the sickle cell unit complaining of increasing left-sided chest pain in limb pain over the past 24 hours. He describes the pain as being sharp left-sided in nature. Pain is not pleuritic. There's been no increase shortness or breath. Denies significant cough palpitations or diaphoresis. His been taken his medication on regular basis. He noted a pain was not controlled with his regimen and came in for further evaluation. Patient does not smoke or drink. He has not been around smoke as well. Taken his other medication as directed. Past Medical History  Diagnosis Date  . Sickle cell anemia   . Blood transfusion   . 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 Hypokalemia   . Mood disorder   . Pulmonary embolism   . Avascular necrosis   . Leukocytosis     Chronic  . Thrombocytosis     Chronic  . Hypertension     Past Surgical History  Procedure Date  . Right hip replacement     08/2006  . Cholecystectomy     01/2008  . Porta cath placement   . Porta cath removal   . Umbilical hernia repair     01/2008  . Excision of left periauricular cyst     10/2009  . Excision of right ear lobe cyst with primary closur     11/2007  . Portacath placement 01/05/2012    Procedure: INSERTION PORT-A-CATH;  Surgeon: Adolph Pollack, MD;  Location: [facility] OR;  Service: General;  Laterality: N/A;  ultrasound guiced port a cath insertion with fluoroscopy    Family History  Problem Relation Age of Onset  . Sickle cell anemia Mother   . Sickle cell anemia Father   . Sickle cell trait Brother     Social History History  Substance Use Topics  . Smoking status: Former Smoker -- 13 years    Types: Cigarettes    Quit date: 07/08/2010  . Smokeless tobacco: Never Used  .  Alcohol Use: No    Allergies  Allergen Reactions  . Morphine And Related Hives and Rash    Pt states he is able to tolerate Dilaudid with no reactions.    Current Facility-Administered Medications  Medication Dose Route Frequency Provider Last Rate Last Dose  . dextrose 5 %-0.45 % sodium chloride infusion   Intravenous Continuous Gwenyth Bender, MD 125 mL/hr at 01/13/12 1141    . diphenhydrAMINE (BENADRYL) capsule 25-50 mg  25-50 mg Oral Q4H PRN Gwenyth Bender, MD       Or  . diphenhydrAMINE (BENADRYL) injection 12.5-25 mg  12.5-25 mg Intravenous Q4H PRN Gwenyth Bender, MD   25 mg at 01/13/12 1301  . folic acid (FOLVITE) tablet 1 mg  1 mg Oral Daily Gwenyth Bender, MD      . HYDROmorphone (DILAUDID) injection 2-4 mg  2-4 mg Intravenous Q2H PRN Gwenyth Bender, MD   4 mg at 01/13/12 1604  . ondansetron (ZOFRAN) tablet 4 mg  4 mg Oral Q4H PRN Gwenyth Bender, MD       Or  . ondansetron [facility]) injection 4 mg  4 mg Intravenous Q4H PRN Gwenyth Bender, MD   4 mg at 01/13/12 1154  Review of Systems 14 point review of systems otherwise unremarkable. Blood pressure 100/62, pulse 71, temperature 98.3 F (36.8 C), temperature source Oral, resp. rate 18, SpO2 99.00%.  Physical Exam Well-developed well-nourished black male in mild distress. HEENT: No sinus tenderness. No sclera icterus. TMs are clear. Throat posterior pharynx is clear. NECK: No enlarged thyroid. No posterior cervical nodes. LUNGS: Decreased breath sounds left base. Tenderness in left lower lobe costochondral region. Minimal vocal fremitus. CV: Normal S1, S2 without S3. No murmurs rubs. ABDOMEN: No masses or tenderness. EXTREMITIES: Tenderness in left knee versus right. Good pass range of motion. No edema Negative Homans. NEURO: Nonfocal. Alert, oriented x3. Cranial nerves were intact. SKIN: No active lesions.  Data Reviewed  Results for orders placed during the hospital encounter of 01/13/12 (from the past 48 hour(s))  COMPREHENSIVE  METABOLIC PANEL     Status: Abnormal   Collection Time   01/13/12 11:32 AM      Component Value Range Comment   Sodium 137  135 - 145 mEq/L    Potassium 3.7  3.5 - 5.1 mEq/L    Chloride 105  96 - 112 mEq/L    CO2 22  19 - 32 mEq/L    Glucose, Bld 89  70 - 99 mg/dL    BUN 5 (*) 6 - 23 mg/dL    Creatinine, Ser 2.95  0.50 - 1.35 mg/dL    Calcium 8.9  8.4 - 62.1 mg/dL    Total Protein 7.2  6.0 - 8.3 g/dL    Albumin 3.6  3.5 - 5.2 g/dL    AST 29  0 - 37 U/L    ALT 16  0 - 53 U/L    Alkaline Phosphatase 90  39 - 117 U/L    Total Bilirubin 3.3 (*) 0.3 - 1.2 mg/dL    GFR calc non Af Amer >90  >90 mL/min    GFR calc Af Amer >90  >90 mL/min   RETICULOCYTES     Status: Abnormal   Collection Time   01/13/12 11:32 AM      Component Value Range Comment   Retic Ct Pct 13.8 (*) 0.4 - 3.1 %    RBC. 2.43 (*) 4.22 - 5.81 MIL/uL    Retic Count, Manual 335.3 (*) 19.0 - 186.0 K/uL   CBC WITH DIFFERENTIAL     Status: Abnormal   Collection Time   01/13/12 11:32 AM      Component Value Range Comment   WBC 16.2 (*) 4.0 - 10.5 K/uL    RBC 2.43 (*) 4.22 - 5.81 MIL/uL    Hemoglobin 7.4 (*) 13.0 - 17.0 g/dL    HCT 30.8 (*) 65.7 - 52.0 %    MCV 88.5  78.0 - 100.0 fL    MCH 30.5  26.0 - 34.0 pg    MCHC 34.4  30.0 - 36.0 g/dL    RDW 84.6 (*) 96.2 - 15.5 %    Platelets 504 (*) 150 - 400 K/uL    Neutrophils Relative 64  43 - 77 %    Neutro Abs 10.3 (*) 1.7 - 7.7 K/uL    Lymphocytes Relative 21  12 - 46 %    Lymphs Abs 3.4  0.7 - 4.0 K/uL    Monocytes Relative 11  3 - 12 %    Monocytes Absolute 1.9 (*) 0.1 - 1.0 K/uL    Eosinophils Relative 3  0 - 5 %    Eosinophils Absolute 0.5  0.0 - 0.7  K/uL    Basophils Relative 1  0 - 1 %    Basophils Absolute 0.2 (*) 0.0 - 0.1 K/uL    WBC Morphology ATYPICAL LYMPHOCYTES   MILD LEFT SHIFT (1-5% METAS, OCC MYELO, OCC BANDS)   RBC Morphology POLYCHROMASIA PRESENT      Smear Review LARGE PLATELETS PRESENT      Laboratory data reviewed.  Assessment    Sickle  cell crisis. Left-sided chest pain with abnormal lung findings. Leukocytosis. Sickle cell lung disease. Avascular necrosis. Poor venous access with recent Port-A-Cath placement. History recurrent hypokalemia. Rule out secondary to sickle nephropathy versus other. 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    Plan    Chest x-ray PA and lateral Zithromax empirically. Incentive spirometry. IV fluids with half-normal saline with potassium supplementation. Repeat electrolyte picture in a.m. Resume OxyContin, at bedtime Zyprexa. Continue hydroxyurea.       [name] Samples 01/13/2012, 4:07 PM

## 2012-01-14 ENCOUNTER — Non-Acute Institutional Stay (HOSPITAL_COMMUNITY): Payer: Medicare Other

## 2012-01-14 DIAGNOSIS — R1110 Vomiting, unspecified: Secondary | ICD-10-CM | POA: Diagnosis not present

## 2012-01-14 DIAGNOSIS — D5701 Hb-SS disease with acute chest syndrome: Secondary | ICD-10-CM | POA: Diagnosis not present

## 2012-01-14 DIAGNOSIS — G8929 Other chronic pain: Secondary | ICD-10-CM | POA: Diagnosis present

## 2012-01-14 DIAGNOSIS — R079 Chest pain, unspecified: Secondary | ICD-10-CM | POA: Diagnosis not present

## 2012-01-14 DIAGNOSIS — K5900 Constipation, unspecified: Secondary | ICD-10-CM | POA: Diagnosis not present

## 2012-01-14 DIAGNOSIS — I10 Essential (primary) hypertension: Secondary | ICD-10-CM | POA: Diagnosis present

## 2012-01-14 DIAGNOSIS — D5700 Hb-SS disease with crisis, unspecified: Secondary | ICD-10-CM | POA: Diagnosis not present

## 2012-01-14 DIAGNOSIS — E876 Hypokalemia: Secondary | ICD-10-CM | POA: Diagnosis present

## 2012-01-14 DIAGNOSIS — R17 Unspecified jaundice: Secondary | ICD-10-CM | POA: Diagnosis not present

## 2012-01-14 DIAGNOSIS — F39 Unspecified mood [affective] disorder: Secondary | ICD-10-CM | POA: Diagnosis present

## 2012-01-14 DIAGNOSIS — J40 Bronchitis, not specified as acute or chronic: Secondary | ICD-10-CM | POA: Diagnosis not present

## 2012-01-14 DIAGNOSIS — M8700 Idiopathic aseptic necrosis of unspecified bone: Secondary | ICD-10-CM | POA: Diagnosis not present

## 2012-01-14 DIAGNOSIS — D72829 Elevated white blood cell count, unspecified: Secondary | ICD-10-CM | POA: Diagnosis not present

## 2012-01-14 LAB — CBC WITH DIFFERENTIAL/PLATELET
Basophils Absolute: 0.2 10*3/uL — ABNORMAL HIGH (ref 0.0–0.1)
Eosinophils Absolute: 0.6 10*3/uL (ref 0.0–0.7)
Lymphocytes Relative: 27 % (ref 12–46)
MCHC: 32.9 g/dL (ref 30.0–36.0)
Monocytes Relative: 12 % (ref 3–12)
Platelets: 457 10*3/uL — ABNORMAL HIGH (ref 150–400)
RDW: 22.3 % — ABNORMAL HIGH (ref 11.5–15.5)
WBC: 15.8 10*3/uL — ABNORMAL HIGH (ref 4.0–10.5)

## 2012-01-14 LAB — RETICULOCYTES
RBC.: 2.35 MIL/uL — ABNORMAL LOW (ref 4.22–5.81)
Retic Count, Absolute: 354.9 10*3/uL — ABNORMAL HIGH (ref 19.0–186.0)
Retic Ct Pct: 15.1 % — ABNORMAL HIGH (ref 0.4–3.1)

## 2012-01-14 LAB — COMPREHENSIVE METABOLIC PANEL
BUN: 5 mg/dL — ABNORMAL LOW (ref 6–23)
Calcium: 8.6 mg/dL (ref 8.4–10.5)
Creatinine, Ser: 0.62 mg/dL (ref 0.50–1.35)
GFR calc Af Amer: >90 mL/min (ref >90)
GFR calc non Af Amer: >90 mL/min (ref >90)
Glucose, Bld: 97 mg/dL (ref 70–99)
Sodium: 136 mEq/L (ref 135–145)
Total Protein: 6.6 g/dL (ref 6.0–8.3)

## 2012-01-14 MED ORDER — ENOXAPARIN SODIUM 40 MG/0.4ML ~~LOC~~ SOLN
40.0000 mg | SUBCUTANEOUS | Status: DC
  Administered 2012-01-14 – 2012-01-22 (×9): 40 mg via SUBCUTANEOUS
  Filled 2012-01-14 (×10): qty 0.4

## 2012-01-14 MED ORDER — OXYCODONE HCL ER 40 MG PO T12A
80.0000 mg | EXTENDED_RELEASE_TABLET | Freq: Two times a day (BID) | ORAL | Status: DC
  Administered 2012-01-14 – 2012-01-23 (×18): 80 mg via ORAL
  Filled 2012-01-14 (×20): qty 2

## 2012-01-14 MED ORDER — DEXTROSE 5 % IV SOLN
2000.0000 mg | Freq: Every day | INTRAVENOUS | Status: DC
  Administered 2012-01-14 – 2012-01-22 (×9): 2000 mg via INTRAVENOUS
  Filled 2012-01-14 (×9): qty 2

## 2012-01-14 MED ORDER — DEXTROSE-NACL 5-0.45 % IV SOLN
INTRAVENOUS | Status: DC
  Administered 2012-01-14 (×2): via INTRAVENOUS
  Administered 2012-01-15 (×2): 1000 mL via INTRAVENOUS
  Administered 2012-01-16 – 2012-01-22 (×11): via INTRAVENOUS

## 2012-01-14 MED ORDER — INFLUENZA VIRUS VACC SPLIT PF IM SUSP
0.5000 mL | INTRAMUSCULAR | Status: AC
  Administered 2012-01-15: 0.5 mL via INTRAMUSCULAR
  Filled 2012-01-14: qty 0.5

## 2012-01-14 MED ORDER — PNEUMOCOCCAL VAC POLYVALENT 25 MCG/0.5ML IJ INJ
0.5000 mL | INJECTION | INTRAMUSCULAR | Status: AC
  Administered 2012-01-15: 0.5 mL via INTRAMUSCULAR
  Filled 2012-01-14: qty 0.5

## 2012-01-14 NOTE — Discharge Summary (Signed)
[facility] Discharge Summary  Patient ID: DEMARR KLUEVER MRN: [id number] DOB/AGE: 01-Feb-1981 31 y.o.  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 History of poor venous access.  Discharged Condition:  No improvement   Clinic Course: Patient was hydrated with half-normal saline with potassium replacement. He was given Dilaudid 4 mg IV q.2 hours for pain. Patient was given Zofran IV and Benadryl for nausea and pruritus.  He was maintained on his home medication as well. By the following day he no improvement and was determined further treatment was needed. Marland Kitchen He did require extra potassium for replacement received in his IV when repleted changed to D5 1/2 NS. His pain still rated as  8/10. He could not manage his pain at home. His hemoglobin at time of discharge was 7.0 recc if <7 may need transfusion on inpatient service.   Discharge vital signs: Blood pressure 114/69, pulse 59, temperature 98.3 F (36.8 C), temperature source Oral, resp. rate 16, SpO2 99.00%.   Disposition: 01-Home or Self Care     Medication List     As of 01/14/2012  8:46 AM    ASK your doctor about these medications         folic acid 1 MG tablet   Commonly known as: FOLVITE   Take 1 tablet (1 mg total) by mouth daily.      HYDROmorphone 4 MG tablet   Commonly known as: DILAUDID   Take 1 tablet (4 mg total) by mouth every 4 (four) hours as needed for pain. For pain.      hydroxyurea 500 MG capsule   Commonly known as: HYDREA   Take 1 capsule (500 mg total) by mouth 2 (two) times daily. May take with food to minimize GI side effects.      lisinopril 10 MG tablet   Commonly known as: PRINIVIL,ZESTRIL   Take 1 tablet (10 mg total) by mouth daily at 10 pm.      OLANZapine  10 MG tablet   Commonly known as: ZYPREXA   Take 10 mg by mouth at bedtime.      oxyCODONE 80 MG 12 hr tablet   Commonly known as: OXYCONTIN   Take 1 tablet (80 mg total) by mouth every 12 (twelve) hours.      potassium chloride SA 20 MEQ tablet   Commonly known as: K-DUR,KLOR-CON   Take 1 tablet (20 mEq total) by mouth 2 (two) times daily.      zolpidem 10 MG tablet   Commonly known as: AMBIEN   Take 1 tablet (10 mg total) by mouth at bedtime as needed. For sleep          Signed: Maralyn Witherell P 01/14/2012, 8:46 AM  Time spent on discharge management greater than 30 minutes.

## 2012-01-14 NOTE — Progress Notes (Signed)
Pt sleeping at this time. Verbalized decrease in pain level. Pt pleasant with only complaint being sharp, aching pain to bilateral  Rib cage area. Report given to day shift.

## 2012-01-14 NOTE — H&P (Signed)
[facility] History and Physical   Date: 01/14/2012  Patient name: [name] Medical record number: [id number] Date of birth: Dec 16, 1980 Age: 31 y.o. Gender: male PCP: Gerald Cashing, MD  Attending physician: Gwenyth Bender, MD  Chief Complaint: Chest pain, left side pain and head ache   History of Present Illness: [name] is a 31 year old African American male with SS genotype Sickle cell disease initially presented to the [facility] with Chest pain on Thursday and Friday non radiating upper chest area denies shortness of breath with head ache bilateral temporal area throbbing rated pain as 9/10. Note left side pain  became unbearable with intensity of stabbing pain which prompted the admission to the clinic. and Baseline 3/10 tolerable 2-3.  Home medication was ineffective.   Meds: Prescriptions prior to admission  Medication Sig Dispense Refill  . folic acid (FOLVITE) 1 MG tablet Take 1 tablet (1 mg total) by mouth daily.  100 tablet  2  . HYDROmorphone (DILAUDID) 4 MG tablet Take 1 tablet (4 mg total) by mouth every 4 (four) hours as needed for pain. For pain.  60 tablet  0  . hydroxyurea (HYDREA) 500 MG capsule Take 1 capsule (500 mg total) by mouth 2 (two) times daily. May take with food to minimize GI side effects.  60 capsule  2  . lisinopril (PRINIVIL,ZESTRIL) 10 MG tablet Take 1 tablet (10 mg total) by mouth daily at 10 pm.  30 tablet  2  . OLANZapine (ZYPREXA) 10 MG tablet Take 10 mg by mouth at bedtime.      Marland Kitchen oxyCODONE (OXYCONTIN) 80 MG 12 hr tablet Take 1 tablet (80 mg total) by mouth every 12 (twelve) hours.  60 tablet  0  . potassium chloride SA (K-DUR,KLOR-CON) 20 MEQ tablet Take 1 tablet (20 mEq total) by mouth 2 (two) times daily.  60 tablet  1  . zolpidem (AMBIEN) 10 MG tablet Take 1 tablet (10 mg total) by mouth at bedtime as needed. For sleep  30 tablet  2    Allergies: Morphine and related Past Medical History  Diagnosis Date  .  Sickle cell anemia   . Blood transfusion   . 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 Hypokalemia   . Mood disorder   . Pulmonary embolism   . Avascular necrosis   . Leukocytosis     Chronic  . Thrombocytosis     Chronic  . Hypertension    Past Surgical History  Procedure Date  . Right hip replacement     08/2006  . Cholecystectomy     01/2008  . Porta cath placement   . Porta cath removal   . Umbilical hernia repair     01/2008  . Excision of left periauricular cyst     10/2009  . Excision of right ear lobe cyst with primary closur     11/2007  . Portacath placement 01/05/2012    Procedure: INSERTION PORT-A-CATH;  Surgeon: Adolph Pollack, MD;  Location: [facility] OR;  Service: General;  Laterality: N/A;  ultrasound guiced port a cath insertion with fluoroscopy   Family History  Problem Relation Age of Onset  . Sickle cell anemia Mother   . Sickle cell anemia Father   . Sickle cell trait Brother    History   Social History  . Marital Status: Single    Spouse Name: N/A    Number of Children: 0  . Years  of Education: 13   Occupational History  . Unemployed    Social History Main Topics  . Smoking status: Former Smoker -- 13 years    Types: Cigarettes    Quit date: 07/08/2010  . Smokeless tobacco: Never Used  . Alcohol Use: No  . Drug Use: No     Comment: quit smoking 2011  . Sexually Active: Yes    Birth Control/ Protection: None   Other Topics Concern  . Not on file   Social History Narrative   Single.  Lives with girlfriend.      Review of Systems: Noted in HPI otherwise negative  Physical Exam: Blood pressure 114/69, pulse 59, temperature 98.3 F (36.8 C), temperature source Oral, resp. rate 16, SpO2 99.00%. General Appearance: Alert and oriented, cooperative, well developed, well nourished, mild distress  Head: Normocephalic, without obvious abnormality, atraumatic  Eyes: PERRL, slight strabismus, mild icteric sclera  Nose:  Nares normal, septum midline, mucosa normal, no drainage or sinus tenderness.  Throat: Normal lips, mucosa, tongue and gums  Back: Symmetric, mild CVA/flank tenderness, lower back tenderness , left side tenderness  Resp: even and nonlabored, no wheezes/rales/rhonchi  Cardio: S1, S2 regular, no murmurs/clicks/rubs/gallops NOTE ** left upper chest port a cath  GI: Soft, ND, Diffuse upper abdominal tenderness, active bowel sounds, no masses, no organomegaly appreciated  Male Genitalia: Deferred, the patient denies priapism  Extremities: Extremities normal, atraumatic, no cyanosis or edema, negative Homans sign, no sign of DVT, no tenderness  Pulses: 2+ and symmetric  Skin: Skin color, texture, turgor normal, no rashes or lesions, numerous tattoos  Neurologic: Grossly normal, CN II-XII intact, no focal deficits  Psych: Appropriate affect      Lab results: Results for orders placed during the hospital encounter of 01/13/12 (from the past 24 hour(s))  COMPREHENSIVE METABOLIC PANEL     Status: Abnormal   Collection Time   01/13/12 11:32 AM      Component Value Range   Sodium 137  135 - 145 mEq/L   Potassium 3.7  3.5 - 5.1 mEq/L   Chloride 105  96 - 112 mEq/L   CO2 22  19 - 32 mEq/L   Glucose, Bld 89  70 - 99 mg/dL   BUN 5 (*) 6 - 23 mg/dL   Creatinine, Ser 4.09  0.50 - 1.35 mg/dL   Calcium 8.9  8.4 - 81.1 mg/dL   Total Protein 7.2  6.0 - 8.3 g/dL   Albumin 3.6  3.5 - 5.2 g/dL   AST 29  0 - 37 U/L   ALT 16  0 - 53 U/L   Alkaline Phosphatase 90  39 - 117 U/L   Total Bilirubin 3.3 (*) 0.3 - 1.2 mg/dL   GFR calc non Af Amer >90  >90 mL/min   GFR calc Af Amer >90  >90 mL/min  RETICULOCYTES     Status: Abnormal   Collection Time   01/13/12 11:32 AM      Component Value Range   Retic Ct Pct 13.8 (*) 0.4 - 3.1 %   RBC. 2.43 (*) 4.22 - 5.81 MIL/uL   Retic Count, Manual 335.3 (*) 19.0 - 186.0 K/uL  CBC WITH DIFFERENTIAL     Status: Abnormal   Collection Time   01/13/12 11:32 AM       Component Value Range   WBC 16.2 (*) 4.0 - 10.5 K/uL   RBC 2.43 (*) 4.22 - 5.81 MIL/uL   Hemoglobin 7.4 (*) 13.0 - 17.0 g/dL  HCT 21.5 (*) 39.0 - 52.0 %   MCV 88.5  78.0 - 100.0 fL   MCH 30.5  26.0 - 34.0 pg   MCHC 34.4  30.0 - 36.0 g/dL   RDW 16.1 (*) 09.6 - 04.5 %   Platelets 504 (*) 150 - 400 K/uL   Neutrophils Relative 64  43 - 77 %   Neutro Abs 10.3 (*) 1.7 - 7.7 K/uL   Lymphocytes Relative 21  12 - 46 %   Lymphs Abs 3.4  0.7 - 4.0 K/uL   Monocytes Relative 11  3 - 12 %   Monocytes Absolute 1.9 (*) 0.1 - 1.0 K/uL   Eosinophils Relative 3  0 - 5 %   Eosinophils Absolute 0.5  0.0 - 0.7 K/uL   Basophils Relative 1  0 - 1 %   Basophils Absolute 0.2 (*) 0.0 - 0.1 K/uL   WBC Morphology ATYPICAL LYMPHOCYTES     RBC Morphology POLYCHROMASIA PRESENT     Smear Review LARGE PLATELETS PRESENT    COMPREHENSIVE METABOLIC PANEL     Status: Abnormal   Collection Time   01/14/12  5:20 AM      Component Value Range   Sodium 136  135 - 145 mEq/L   Potassium 3.9  3.5 - 5.1 mEq/L   Chloride 105  96 - 112 mEq/L   CO2 25  19 - 32 mEq/L   Glucose, Bld 97  70 - 99 mg/dL   BUN 5 (*) 6 - 23 mg/dL   Creatinine, Ser 4.09  0.50 - 1.35 mg/dL   Calcium 8.6  8.4 - 81.1 mg/dL   Total Protein 6.6  6.0 - 8.3 g/dL   Albumin 3.3 (*) 3.5 - 5.2 g/dL   AST 25  0 - 37 U/L   ALT 14  0 - 53 U/L   Alkaline Phosphatase 92  39 - 117 U/L   Total Bilirubin 2.6 (*) 0.3 - 1.2 mg/dL   GFR calc non Af Amer >90  >90 mL/min   GFR calc Af Amer >90  >90 mL/min  RETICULOCYTES     Status: Abnormal   Collection Time   01/14/12  5:20 AM      Component Value Range   Retic Ct Pct 15.1 (*) 0.4 - 3.1 %   RBC. 2.35 (*) 4.22 - 5.81 MIL/uL   Retic Count, Manual 354.9 (*) 19.0 - 186.0 K/uL  CBC WITH DIFFERENTIAL     Status: Abnormal   Collection Time   01/14/12  5:20 AM      Component Value Range   WBC 15.8 (*) 4.0 - 10.5 K/uL   RBC 2.35 (*) 4.22 - 5.81 MIL/uL   Hemoglobin 7.0 (*) 13.0 - 17.0 g/dL   HCT 91.4 (*) 78.2 -  52.0 %   MCV 90.6  78.0 - 100.0 fL   MCH 29.8  26.0 - 34.0 pg   MCHC 32.9  30.0 - 36.0 g/dL   RDW 95.6 (*) 21.3 - 08.6 %   Platelets 457 (*) 150 - 400 K/uL   Neutrophils Relative 56  43 - 77 %   Lymphocytes Relative 27  12 - 46 %   Monocytes Relative 12  3 - 12 %   Eosinophils Relative 4  0 - 5 %   Basophils Relative 1  0 - 1 %   Neutro Abs 8.8 (*) 1.7 - 7.7 K/uL   Lymphs Abs 4.3 (*) 0.7 - 4.0 K/uL   Monocytes Absolute 1.9 (*)  0.1 - 1.0 K/uL   Eosinophils Absolute 0.6  0.0 - 0.7 K/uL   Basophils Absolute 0.2 (*) 0.0 - 0.1 K/uL   RBC Morphology POLYCHROMASIA PRESENT      Imaging results:  Dg Chest 2 View  01/14/2012  *RADIOLOGY REPORT*  Clinical Data: Left chest pain, sickle cell crisis  CHEST - 2 VIEW  Comparison: 01/05/2012  Findings: Increased interstitial markings/bronchitic changes.  No focal consolidation. No pleural effusion or pneumothorax.  Stable mild cardiomegaly.  Left chest power port.  Visualized osseous structures are within normal limits.  Cholecystectomy clips.  IMPRESSION: No evidence of acute cardiopulmonary disease.  Increased interstitial markings/bronchitic changes, stable.   Original Report Authenticated By: Charline Bills, M.D.      Assessment & Plan: Patient Active Hospital Problem List:  Vaso occlusive crisis - elevated Retic count manual 335  , Retic Ct Pct 13.8  on adm; 2.6  this am; (T. Bili 3.3 on adm); IV hydration  anti-emetic prn, anti-pruretic prn; and bowel management regimen; most recent Hgb electrophoresis on 12/17/11 with Hgb A 50.2, Hgb F 6.1 %, and Hgb S 40.6 %; on Hydrea and folic Acid daily; pharmacy to initiate  DVT prophylaxis    Acute on Chronic Pain- continue  Dilaudid 4mg  and long-acting Oxycontin 80 mg BID; follow   Left Side CP/DOE/history ACS- CXR pending  Hemolytic  Anemia of SCD- Hgb 7.0 this am; (7.94on adm [facility]); noting active hemolysis, suspect will decline; continue to follow closely and consider transfusion  1 unit if <7.0   Leukocytosis - WBC 15.8 this am remains afebrile probable inflammatory /reactive process Secondary Hemochromatosis- start  IV Desferal while inpatient  Hypokalemia- K level 3.9 this am; currently on  20 meq po 20 and 20 meq in IVF after completion of this liter change to D5 1/2 NS at 100cc/hr    Insomnia-  Ambien 10mg  po qhs prn; follow   HTN- controlled on Lisinopril 10mg  po daily for cardiorenal protection; add parameters (hold for SBP <110); follow closely   Mood Disorder- stable on Zyprexa   Vascular Access- Port-A-Cath placement on 01/03/12 by Dr. Galvin Proffer, [name] 01/14/2012, 8:52 AM

## 2012-01-14 NOTE — Progress Notes (Signed)
Patient discharge from sickle cell unit, via w/c. Vitals stable. No complications, report given to [facility] on 3 west room 1338. Lt port remain access and saline lock. Belongings given to patient.

## 2012-01-15 DIAGNOSIS — J40 Bronchitis, not specified as acute or chronic: Secondary | ICD-10-CM

## 2012-01-15 DIAGNOSIS — D5700 Hb-SS disease with crisis, unspecified: Principal | ICD-10-CM

## 2012-01-15 DIAGNOSIS — R1110 Vomiting, unspecified: Secondary | ICD-10-CM

## 2012-01-15 LAB — COMPREHENSIVE METABOLIC PANEL
AST: 30 U/L (ref 0–37)
CO2: 23 mEq/L (ref 19–32)
Calcium: 8.9 mg/dL (ref 8.4–10.5)
Creatinine, Ser: 0.63 mg/dL (ref 0.50–1.35)
GFR calc Af Amer: >90 mL/min (ref >90)
GFR calc non Af Amer: >90 mL/min (ref >90)
Sodium: 136 mEq/L (ref 135–145)
Total Protein: 7.5 g/dL (ref 6.0–8.3)

## 2012-01-15 LAB — CBC WITH DIFFERENTIAL/PLATELET
Basophils Absolute: 0.2 10*3/uL — ABNORMAL HIGH (ref 0.0–0.1)
Basophils Relative: 1 % (ref 0–1)
Hemoglobin: 7.7 g/dL — ABNORMAL LOW (ref 13.0–17.0)
Lymphocytes Relative: 22 % (ref 12–46)
MCHC: 33.5 g/dL (ref 30.0–36.0)
Neutro Abs: 13.2 10*3/uL — ABNORMAL HIGH (ref 1.7–7.7)
Neutrophils Relative %: 62 % (ref 43–77)
RDW: 21.8 % — ABNORMAL HIGH (ref 11.5–15.5)
WBC: 21.4 10*3/uL — ABNORMAL HIGH (ref 4.0–10.5)

## 2012-01-15 LAB — RETICULOCYTES
RBC.: 2.56 MIL/uL — ABNORMAL LOW (ref 4.22–5.81)
Retic Count, Absolute: 407 10*3/uL — ABNORMAL HIGH (ref 19.0–186.0)

## 2012-01-15 MED ORDER — SODIUM CHLORIDE 0.9 % IJ SOLN: 9.0000 mL | INTRAMUSCULAR | Status: DC | PRN

## 2012-01-15 MED ORDER — NALOXONE HCL 0.4 MG/ML IJ SOLN: 0.4000 mg | INTRAMUSCULAR | Status: DC | PRN

## 2012-01-15 MED ORDER — DIPHENHYDRAMINE HCL 50 MG PO CAPS
50.0000 mg | ORAL_CAPSULE | ORAL | Status: DC | PRN
  Administered 2012-01-15 – 2012-01-18 (×7): 50 mg via ORAL
  Filled 2012-01-15 (×7): qty 1

## 2012-01-15 MED ORDER — HYDROMORPHONE 0.3 MG/ML IV SOLN
INTRAVENOUS | Status: DC
  Administered 2012-01-15: 2.7 mg via INTRAVENOUS
  Administered 2012-01-15: 0.3 mg via INTRAVENOUS
  Administered 2012-01-16: via INTRAVENOUS
  Administered 2012-01-16: 2.4 mg via INTRAVENOUS
  Administered 2012-01-16: 8.4 mg via INTRAVENOUS
  Administered 2012-01-16: 3.6 mg via INTRAVENOUS
  Administered 2012-01-16: 4.2 mg via INTRAVENOUS
  Administered 2012-01-16: 15:00:00 via INTRAVENOUS
  Administered 2012-01-16: 1.1 mg via INTRAVENOUS
  Administered 2012-01-16: 1.6 mg via INTRAVENOUS
  Administered 2012-01-17: 2.85 mg via INTRAVENOUS
  Administered 2012-01-17: 4.67 mg via INTRAVENOUS
  Administered 2012-01-17: 2.7 mg via INTRAVENOUS
  Administered 2012-01-17 (×3): via INTRAVENOUS
  Administered 2012-01-17: 1.8 mg via INTRAVENOUS
  Administered 2012-01-17: 7.48 mg via INTRAVENOUS
  Administered 2012-01-17: 1 mg via INTRAVENOUS
  Administered 2012-01-17: 2.4 mg via INTRAVENOUS
  Administered 2012-01-17: 5.1 mg via INTRAVENOUS
  Administered 2012-01-17: 22:00:00 via INTRAVENOUS
  Administered 2012-01-18: 0.6 mg via INTRAVENOUS
  Administered 2012-01-18: 15:00:00 via INTRAVENOUS
  Administered 2012-01-18: 5.4 mg via INTRAVENOUS
  Administered 2012-01-18: 0.9 mg via INTRAVENOUS
  Administered 2012-01-18: 05:00:00 via INTRAVENOUS
  Administered 2012-01-18: 7.5 mg via INTRAVENOUS
  Administered 2012-01-18: 8.1 mg via INTRAVENOUS
  Administered 2012-01-18: 20:00:00 via INTRAVENOUS
  Administered 2012-01-19: 7.5 mg via INTRAVENOUS
  Administered 2012-01-19 (×4): via INTRAVENOUS
  Administered 2012-01-19: 7.5 mg via INTRAVENOUS
  Administered 2012-01-19: 3.9 mg via INTRAVENOUS
  Administered 2012-01-19: 8.99 mg via INTRAVENOUS
  Administered 2012-01-19 (×2): 2.4 mg via INTRAVENOUS
  Administered 2012-01-20: 1.78 mg via INTRAVENOUS
  Administered 2012-01-20: 4.77 mg via INTRAVENOUS
  Administered 2012-01-20 (×2): via INTRAVENOUS
  Administered 2012-01-20: 2.06 mg via INTRAVENOUS
  Administered 2012-01-20: 10.18 mg via INTRAVENOUS
  Administered 2012-01-20 (×2): via INTRAVENOUS
  Administered 2012-01-20: 5.4 mg via INTRAVENOUS
  Administered 2012-01-20: 4.78 mg via INTRAVENOUS
  Administered 2012-01-21: 21:00:00 via INTRAVENOUS
  Administered 2012-01-21: 11.97 mg via INTRAVENOUS
  Administered 2012-01-21 (×2): via INTRAVENOUS
  Administered 2012-01-21: 7.45 mg via INTRAVENOUS
  Administered 2012-01-21: 6.24 mg via INTRAVENOUS
  Administered 2012-01-21: 15:00:00 via INTRAVENOUS
  Administered 2012-01-22: 8.65 mg via INTRAVENOUS
  Administered 2012-01-22: 6.5 mg via INTRAVENOUS
  Administered 2012-01-22: via INTRAVENOUS
  Administered 2012-01-22: 8.6 mg via INTRAVENOUS
  Administered 2012-01-22: 11:00:00 via INTRAVENOUS
  Administered 2012-01-22: 4.85 mg via INTRAVENOUS
  Filled 2012-01-15 (×25): qty 25

## 2012-01-15 NOTE — Progress Notes (Signed)
Pt c/o of his pain medicine not working for very long and requesting his Benedryl be changed to IV. Dr. Ashley Royalty in to speak with pt. He was started on a PCA pump and his Benedryl was increased to 50 mg. PO. He states his pain is now a 5 on the PCA/Dilaudid. Pt given the flu and pneumonia vaccine today. Appetite good, ambulating to the bathroom,tolerating well. Pt told Dr. Ashley Royalty that he has been experiencing vomiting without any warning for about a year. An UGI W/ small bowel schedule for 12/16 @ 0800 because radiology called to say the radiologist would not come in for a routine UGI w/small bowel tonight. Pt needs to be NPO after Midnight.

## 2012-01-15 NOTE — Progress Notes (Signed)
Utilization Review Completed.[name] T12/15/2013   

## 2012-01-15 NOTE — Care Management Note (Signed)
    Page 1 of 1   01/15/2012     2:03:30 PM   CARE MANAGEMENT NOTE 01/15/2012  Patient:  [name], [name]   Account Number:  [id number]  Date Initiated:  01/15/2012  Documentation initiated by:  Lanier Clam  Subjective/Objective Assessment:   ADMITTED W/SICKLE CELL CRISIS.     Action/Plan:   FROM HOME   Anticipated DC Date:  01/15/2012   Anticipated DC Plan:  HOME/SELF CARE         Choice offered to / List presented to:             Status of service:  Completed, signed off Medicare Important Message given?   (If response is "NO", the following Medicare IM given date fields will be blank) Date Medicare IM given:   Date Additional Medicare IM given:    Discharge Disposition:  HOME/SELF CARE  Per UR Regulation:    If discussed at Long Length of Stay Meetings, dates discussed:    Comments:  01/15/12 Kalin Kyler RN,BSN NCM [phone] NO D/C NEEDS OR ORDERS.

## 2012-01-15 NOTE — Progress Notes (Signed)
Pt. Appetite  Great. Complaining of pain in his back and bilateral legs. Ambulating to the bathroom, tolerating well. Refuses to use urinal to measure output. Wants to speak to MD about getting Benedryl and Zofran IV.

## 2012-01-15 NOTE — Progress Notes (Signed)
Sickle Cell Service PROGRESS NOTE  [name] WGN:[id number] DOB: [birth year] DOA: 01/13/2012 PCP: [name], MD  Assessment/Plan: Active Problems:  Sickle cell anemia with crisis: Pt's pain only controlled for approximately 1 hour. Pain decreases to 6/10 with boluses. Will transition to Dilaudid PCA and re-evaluate in the morning. Continue Toradol, IVF and heat for adjunct therapy.   Leukocytosis: No S/S of infection. Likely related to inflammatory component of VOC. Continue to monitor.  Emesis: Pt reports that he has had episodes of projectile vomiting at various times of the day that is not associated with any other symptoms and is unprovoked. Will get an UGI with small bowel follow through.   Secondary hemochromatosis: Continue Desferal     Code Status: Full Code Family Communication: N/A Disposition Plan: Home at time of discharge.  [name],[name]  [facility] Pager [phone]. If 7PM-7AM, please contact night-coverage. 01/15/2012, 3:27 PM  LOS: 2 days   Brief narrative: [name] is a 31 year old African American male with SS genotype Sickle cell disease initially presented to the [facility] with Chest pain on Thursday and Friday non radiating upper chest area denies shortness of breath with head ache bilateral temporal area throbbing rated pain as 9/10. Note left side pain became unbearable with intensity of stabbing pain which prompted the admission to the clinic. and Baseline 3/10 tolerable 2-3. Home medication was ineffective.    Consultants:  None  Procedures:  None  Antibiotics:  Azithromycin 12/14 >>  HPI/Subjective: Pt's pain only controlled for approximately 1 hour. Pain decreases to 6/10 with boluses. Pain is localized to back and legs and is throbbing in nature. No associated symptoms, and no palliative or provocative features.  Objective: Filed Vitals:   01/15/12 0516 01/15/12 0955 01/15/12 1100 01/15/12 1500  BP: 118/75  123/73 123/73 119/69  Pulse: 88 72  80  Temp: 98.4 F (36.9 C) 98.1 F (36.7 C)  98.6 F (37 C)  TempSrc: Oral Oral  Oral  Resp: 16 16  18   Height:      Weight: 77.7 kg (171 lb 4.8 oz)     SpO2: 99% 99%  100%   Weight change:   Intake/Output Summary (Last 24 hours) at 01/15/12 1527 Last data filed at 01/15/12 0955  Gross per 24 hour  Intake    600 ml  Output   1100 ml  Net   -500 ml    General: Alert, awake, oriented x3, in no acute distress.  HEENT: [place]/AT PEERL, EOMI, Anicteric OROPHARYNX:  Moist, No exudate/ erythema/lesions.  Heart: Regular rate and rhythm, without murmurs, rubs, gallops.  Lungs: Clear to auscultation, no wheezing or rhonchi noted.   Abdomen: Soft, nontender, nondistended, positive bowel sounds, no masses no hepatosplenomegaly noted.  Neuro: No focal neurological deficits noted. Strength normal in bilateral upper and lower extremities. Musculoskeletal: No warm swelling or erythema around joints, no spinal tenderness noted. Psychiatric: Patient alert and oriented x3, good insight and cognition, good recent to remote recall.    Data Reviewed: Basic Metabolic Panel:  Lab 01/15/12 9528 01/14/12 0520 01/13/12 1132  NA 136 136 137  K 3.9 3.9 3.7  CL 102 105 105  CO2 23 25 22   GLUCOSE 118* 97 89  BUN 4* 5* 5*  CREATININE 0.63 0.62 0.61  CALCIUM 8.9 8.6 8.9  MG -- -- --  PHOS -- -- --   Liver Function Tests:  Lab 01/15/12 0530 01/14/12 0520 01/13/12 1132  AST 30 25 29   ALT 16  14 16  ALKPHOS 104 92 90  BILITOT 3.2* 2.6* 3.3*  PROT 7.5 6.6 7.2  ALBUMIN 3.9 3.3* 3.6   No results found for this basename: LIPASE:5,AMYLASE:5 in the last 168 hours No results found for this basename: AMMONIA:5 in the last 168 hours CBC:  Lab 01/15/12 0530 01/14/12 0520 01/13/12 1132  WBC 21.4* 15.8* 16.2*  NEUTROABS 13.2* 8.8* 10.3*  HGB 7.7* 7.0* 7.4*  HCT 23.0* 21.3* 21.5*  MCV 89.8 90.6 88.5  PLT 464* 457* 504*   Cardiac Enzymes: No results found for this  basename: CKTOTAL:5,CKMB:5,CKMBINDEX:5,TROPONINI:5 in the last 168 hours BNP (last 3 results)  Basename 07/18/11 1500 07/05/11 0620 06/07/11 0650  PROBNP 13.5 219.1* 7.4   CBG: No results found for this basename: GLUCAP:5 in the last 168 hours  No results found for this or any previous visit (from the past 240 hour(s)).   Studies: Dg Chest 2 View  01/14/2012  *RADIOLOGY REPORT*  Clinical Data: Left chest pain, sickle cell crisis  CHEST - 2 VIEW  Comparison: 01/05/2012  Findings: Increased interstitial markings/bronchitic changes.  No focal consolidation. No pleural effusion or pneumothorax.  Stable mild cardiomegaly.  Left chest power port.  Visualized osseous structures are within normal limits.  Cholecystectomy clips.  IMPRESSION: No evidence of acute cardiopulmonary disease.  Increased interstitial markings/bronchitic changes, stable.   Original Report Authenticated By: Charline Bills, M.D.    Dg Chest Port 1 View  01/05/2012  *RADIOLOGY REPORT*  Clinical Data: Status post Port-A-Cath insertion.  History of sickle cell anemia.  PORTABLE CHEST - 1 VIEW  Comparison: 12/10/2011  Findings: A left subclavian Port-A-Cath has been placed with the catheter tip at the cavoatrial junction.  No pneumothorax is present after placement.  Lungs show chronic disease and bibasilar scarring.  No pulmonary edema or pleural fluid identified.  Cardiac and mediastinal contours are stable.  IMPRESSION: Port-A-Cath tip lies at the cavoatrial junction.  No pneumothorax or other acute findings after placement.   Original Report Authenticated By: Irish Lack, M.D.    Dg Fluoro Guide Cv Line-no Report  01/05/2012  CLINICAL DATA: porta cath insertion   FLOURO GUIDE CV LINE  Fluoroscopy was utilized by the requesting physician.  No radiographic  interpretation.      Scheduled Meds:   . azithromycin  250 mg Oral Daily  . deferoxamine (DESFERAL) IV  2,000 mg Intravenous QHS  . enoxaparin  40 mg Subcutaneous Q24H   . folic acid  1 mg Oral Daily  .  HYDROmorphone (DILAUDID) injection  2-4 mg Intravenous Q2H  . HYDROmorphone PCA 0.3 mg/mL   Intravenous Q4H  . hydroxyurea  500 mg Oral BID  . lisinopril  10 mg Oral Daily  . OLANZapine  10 mg Oral QHS  . OxyCODONE  80 mg Oral Q12H  . potassium chloride SA  20 mEq Oral BID   Continuous Infusions:   . dextrose 5 % and 0.45% NaCl 1,000 mL (01/15/12 1254)    Active Problems:  Leukocytosis  Hemoglobin S-S disease  Sickle cell anemia with crisis  Secondary hemochromatosis  Elevated bilirubin

## 2012-01-16 ENCOUNTER — Inpatient Hospital Stay (HOSPITAL_COMMUNITY): Payer: Medicare Other

## 2012-01-16 DIAGNOSIS — D72829 Elevated white blood cell count, unspecified: Secondary | ICD-10-CM

## 2012-01-16 DIAGNOSIS — K5900 Constipation, unspecified: Secondary | ICD-10-CM

## 2012-01-16 LAB — CBC WITH DIFFERENTIAL/PLATELET
Basophils Absolute: 0.2 10*3/uL — ABNORMAL HIGH (ref 0.0–0.1)
Eosinophils Absolute: 1 10*3/uL — ABNORMAL HIGH (ref 0.0–0.7)
HCT: 23.7 % — ABNORMAL LOW (ref 39.0–52.0)
Hemoglobin: 7.9 g/dL — ABNORMAL LOW (ref 13.0–17.0)
Lymphocytes Relative: 11 % — ABNORMAL LOW (ref 12–46)
Monocytes Relative: 11 % (ref 3–12)
Neutro Abs: 14.6 10*3/uL — ABNORMAL HIGH (ref 1.7–7.7)
Neutrophils Relative %: 72 % (ref 43–77)
RDW: 21.1 % — ABNORMAL HIGH (ref 11.5–15.5)
WBC: 20.2 10*3/uL — ABNORMAL HIGH (ref 4.0–10.5)

## 2012-01-16 MED ORDER — POLYETHYLENE GLYCOL 3350 17 G PO PACK
17.0000 g | PACK | Freq: Every day | ORAL | Status: DC
  Administered 2012-01-16 – 2012-01-18 (×3): 17 g via ORAL
  Filled 2012-01-16 (×3): qty 1

## 2012-01-16 MED ORDER — SENNOSIDES-DOCUSATE SODIUM 8.6-50 MG PO TABS
1.0000 | ORAL_TABLET | Freq: Two times a day (BID) | ORAL | Status: DC
  Administered 2012-01-16 – 2012-01-23 (×12): 1 via ORAL
  Filled 2012-01-16 (×15): qty 1

## 2012-01-16 NOTE — Progress Notes (Signed)
Sickle Cell Service PROGRESS NOTE  [name] [name] DOB: 09/29/1980 DOA: 01/13/2012 PCP: Gerald Cashing, MD  Assessment/Plan: Active Problems:  Sickle cell anemia with crisis: Pt reports pain decreases to 6/10 with PCA. Will continue PCA and re-evaluate in the morning. Continue Toradol, IVF and heat for adjunct therapy.   Leukocytosis: No S/S of infection. Likely related to inflammatory component of VOC. Continue to monitor.  Emesis: Pt reports that he has had episodes of projectile vomiting at various times of the day that is not associated with any other symptoms and is unprovoked.  UGI with small bowel follow through normal. However findings of severe constipation are likely contributing to emesis.  Constipation: Start Miralax   Secondary hemochromatosis: Continue Desferal     Code Status: Full Code Family Communication: N/A Disposition Plan: Home at time of discharge.  Gerald A.  [facility] Pager [phone]. If 7PM-7AM, please contact night-coverage. 01/16/2012, 6:16 PM  LOS: 3 days   Brief narrative: [name] is a 31 year old African American male with SS genotype Sickle cell disease initially presented to the [facility] with Chest pain on Thursday and Friday non radiating upper chest area denies shortness of breath with head ache bilateral temporal area throbbing rated pain as 9/10. Note left side pain became unbearable with intensity of stabbing pain which prompted the admission to the clinic. and Baseline 3/10 tolerable 2-3. Home medication was ineffective.    Consultants:  None  Procedures:  None  Antibiotics:  Azithromycin 12/14 >>  HPI/Subjective: Pt's pain now 6/10 with PCA. Pain is localized to back and legs and is throbbing in nature. No associated symptoms, and no palliative or provocative features.  Objective: Filed Vitals:   01/16/12 1307 01/16/12 1338 01/16/12 1625 01/16/12 1741  BP:  116/74  115/71  Pulse:   84  88  Temp:  98.7 F (37.1 C)  98.3 F (36.8 C)  TempSrc:  Oral  Oral  Resp: 21 16 21 18   Height:      Weight:      SpO2: 98% 100% 100% 100%   Weight change: 0.1 kg (3.5 oz)  Intake/Output Summary (Last 24 hours) at 01/16/12 1816 Last data filed at 01/16/12 1500  Gross per 24 hour  Intake 1455.8 ml  Output   2375 ml  Net -919.2 ml    General: Alert, awake, oriented x3, in no acute distress.  HEENT: [place]/AT PEERL, EOMI, Anicteric OROPHARYNX:  Moist, No exudate/ erythema/lesions.  Heart: Regular rate and rhythm, without murmurs, rubs, gallops.  Lungs: Clear to auscultation, no wheezing or rhonchi noted.   Abdomen: Soft, nontender, nondistended, positive bowel sounds, no masses no hepatosplenomegaly noted.  Neuro: No focal neurological deficits noted. Strength normal in bilateral upper and lower extremities. Musculoskeletal: No warm swelling or erythema around joints, no spinal tenderness noted. Psychiatric: Patient alert and oriented x3, good insight and cognition, good recent to remote recall.    Data Reviewed: Basic Metabolic Panel:  Lab 01/15/12 8295 01/14/12 0520 01/13/12 1132  NA 136 136 137  K 3.9 3.9 3.7  CL 102 105 105  CO2 23 25 22   GLUCOSE 118* 97 89  BUN 4* 5* 5*  CREATININE 0.63 0.62 0.61  CALCIUM 8.9 8.6 8.9  MG -- -- --  PHOS -- -- --   Liver Function Tests:  Lab 01/15/12 0530 01/14/12 0520 01/13/12 1132  AST 30 25 29   ALT 16 14 16   ALKPHOS 104 92 90  BILITOT 3.2* 2.6* 3.3*  PROT 7.5 6.6 7.2  ALBUMIN 3.9 3.3* 3.6   No results found for this basename: LIPASE:5,AMYLASE:5 in the last 168 hours No results found for this basename: AMMONIA:5 in the last 168 hours CBC:  Lab 01/16/12 0605 01/15/12 0530 01/14/12 0520 01/13/12 1132  WBC 20.2* 21.4* 15.8* 16.2*  NEUTROABS 14.6* 13.2* 8.8* 10.3*  HGB 7.9* 7.7* 7.0* 7.4*  HCT 23.7* 23.0* 21.3* 21.5*  MCV 89.8 89.8 90.6 88.5  PLT 472* 464* 457* 504*   Cardiac Enzymes: No results found for this  basename: CKTOTAL:5,CKMB:5,CKMBINDEX:5,TROPONINI:5 in the last 168 hours BNP (last 3 results)  Basename 07/18/11 1500 07/05/11 0620 06/07/11 0650  PROBNP 13.5 219.1* 7.4   CBG: No results found for this basename: GLUCAP:5 in the last 168 hours  No results found for this or any previous visit (from the past 240 hour(s)).   Studies: Dg Chest 2 View  01/14/2012  *RADIOLOGY REPORT*  Clinical Data: Left chest pain, sickle cell crisis  CHEST - 2 VIEW  Comparison: 01/05/2012  Findings: Increased interstitial markings/bronchitic changes.  No focal consolidation. No pleural effusion or pneumothorax.  Stable mild cardiomegaly.  Left chest power port.  Visualized osseous structures are within normal limits.  Cholecystectomy clips.  IMPRESSION: No evidence of acute cardiopulmonary disease.  Increased interstitial markings/bronchitic changes, stable.   Original Report Authenticated By: Charline Bills, M.D.    Dg Chest Port 1 View  01/05/2012  *RADIOLOGY REPORT*  Clinical Data: Status post Port-A-Cath insertion.  History of sickle cell anemia.  PORTABLE CHEST - 1 VIEW  Comparison: 12/10/2011  Findings: A left subclavian Port-A-Cath has been placed with the catheter tip at the cavoatrial junction.  No pneumothorax is present after placement.  Lungs show chronic disease and bibasilar scarring.  No pulmonary edema or pleural fluid identified.  Cardiac and mediastinal contours are stable.  IMPRESSION: Port-A-Cath tip lies at the cavoatrial junction.  No pneumothorax or other acute findings after placement.   Original Report Authenticated By: Irish Lack, M.D.    Dg Fluoro Guide Cv Line-no Report  01/05/2012  CLINICAL DATA: porta cath insertion   FLOURO GUIDE CV LINE  Fluoroscopy was utilized by the requesting physician.  No radiographic  interpretation.      Scheduled Meds:    . azithromycin  250 mg Oral Daily  . deferoxamine (DESFERAL) IV  2,000 mg Intravenous QHS  . enoxaparin  40 mg Subcutaneous  Q24H  . folic acid  1 mg Oral Daily  . HYDROmorphone PCA 0.3 mg/mL   Intravenous Q4H  . hydroxyurea  500 mg Oral BID  . lisinopril  10 mg Oral Daily  . OLANZapine  10 mg Oral QHS  . OxyCODONE  80 mg Oral Q12H  . polyethylene glycol  17 g Oral Daily  . potassium chloride SA  20 mEq Oral BID  . senna-docusate  1 tablet Oral BID   Continuous Infusions:    . dextrose 5 % and 0.45% NaCl 100 mL/hr at 01/16/12 1511    Active Problems:  Leukocytosis  Hemoglobin S-S disease  Sickle cell anemia with crisis  Secondary hemochromatosis  Elevated bilirubin  Emesis

## 2012-01-17 LAB — CBC WITH DIFFERENTIAL/PLATELET
Basophils Absolute: 0.1 10*3/uL (ref 0.0–0.1)
Basophils Relative: 0 % (ref 0–1)
HCT: 23.9 % — ABNORMAL LOW (ref 39.0–52.0)
Lymphocytes Relative: 19 % (ref 12–46)
MCHC: 33.5 g/dL (ref 30.0–36.0)
Monocytes Absolute: 2.3 10*3/uL — ABNORMAL HIGH (ref 0.1–1.0)
Neutro Abs: 9.9 10*3/uL — ABNORMAL HIGH (ref 1.7–7.7)
Neutrophils Relative %: 62 % (ref 43–77)
Platelets: 446 10*3/uL — ABNORMAL HIGH (ref 150–400)
RDW: 20.5 % — ABNORMAL HIGH (ref 11.5–15.5)
WBC: 15.9 10*3/uL — ABNORMAL HIGH (ref 4.0–10.5)

## 2012-01-17 MED ORDER — MAGNESIUM CITRATE PO SOLN
1.0000 | Freq: Once | ORAL | Status: AC
  Administered 2012-01-17: 1 via ORAL
  Filled 2012-01-17: qty 296

## 2012-01-17 NOTE — Progress Notes (Signed)
Subjective: [name] was seen on rounds today. He complains of pain to his chest, left side and hips. His pain is described as intermittent sharp and remains without associated SOB. He currently rates his pain 6/10. He denies having any further N/V, abdominal pain, fever, chills, headache, dizziness, dysuria, hematuria, urinary urgency, frequency, or priapism. He denies having a bowel movement since adm despite his current bowel management regimen and reports "normal bowel movements every 2-3 days at home". Appetite has been poor with early satiety.    Allergies  Allergen Reactions  . Morphine And Related Hives and Rash    Pt states he is able to tolerate Dilaudid with no reactions.   Current Facility-Administered Medications  Medication Dose Route Frequency Provider Last Rate Last Dose  . deferoxamine (DESFERAL) 2,000 mg in dextrose 5 % 500 mL infusion  2,000 mg Intravenous QHS Grayce Sessions, NP 125 mL/hr at 01/16/12 2230 2,000 mg at 01/16/12 2230  . dextrose 5 %-0.45 % sodium chloride infusion   Intravenous Continuous Grayce Sessions, NP 100 mL/hr at 01/17/12 1547    . diphenhydrAMINE (BENADRYL) capsule 50 mg  50 mg Oral Q4H PRN Altha Harm, MD   50 mg at 01/17/12 0139  . enoxaparin (LOVENOX) injection 40 mg  40 mg Subcutaneous Q24H Grayce Sessions, NP   40 mg at 01/17/12 1552  . folic acid (FOLVITE) tablet 1 mg  1 mg Oral Daily Gwenyth Bender, MD   1 mg at 01/17/12 1024  . HYDROmorphone (DILAUDID) PCA injection 0.3 mg/mL   Intravenous Q4H Altha Harm, MD      . hydroxyurea (HYDREA) capsule 500 mg  500 mg Oral BID Gwenyth Bender, MD   500 mg at 01/17/12 1018  . lisinopril (PRINIVIL,ZESTRIL) tablet 10 mg  10 mg Oral Daily Gwenyth Bender, MD   10 mg at 01/17/12 1024  . naloxone (NARCAN) injection 0.4 mg  0.4 mg Intravenous PRN Altha Harm, MD       And  . sodium chloride 0.9 % injection 9 mL  9 mL Intravenous PRN Altha Harm, MD      . OLANZapine (ZYPREXA)  tablet 10 mg  10 mg Oral QHS Gwenyth Bender, MD   10 mg at 01/16/12 2230  . ondansetron (ZOFRAN) tablet 4 mg  4 mg Oral Q4H PRN Gwenyth Bender, MD   4 mg at 01/16/12 0029   Or  . ondansetron [facility]) injection 4 mg  4 mg Intravenous Q4H PRN Gwenyth Bender, MD   4 mg at 01/14/12 1432  . OxyCODONE (OXYCONTIN) 12 hr tablet 80 mg  80 mg Oral Q12H Gwenyth Bender, MD   80 mg at 01/17/12 1024  . polyethylene glycol (MIRALAX / GLYCOLAX) packet 17 g  17 g Oral Daily Altha Harm, MD   17 g at 01/17/12 1023  . potassium chloride SA (K-DUR,KLOR-CON) CR tablet 20 mEq  20 mEq Oral BID Gwenyth Bender, MD   20 mEq at 01/17/12 1023  . senna-docusate (Senokot-S) tablet 1 tablet  1 tablet Oral BID Altha Harm, MD   1 tablet at 01/17/12 1024  . zolpidem (AMBIEN) tablet 10 mg  10 mg Oral QHS PRN Gwenyth Bender, MD   10 mg at 01/15/12 2230    Objective: Blood pressure 107/67, pulse 90, temperature 98.6 F (37 C), temperature source Oral, resp. rate 13, height 6' (1.829 m), weight 78 kg (171 lb 15.3 oz), SpO2  100.00%.  General Appearance: Alert, Ox3, pleasant and cooperative, WD, WN, NAD.  Head: Normocephalic, atraumatic  Eyes: PERRL, slight strabismus, mild icteric sclera  Back: Symmetric, no CVA tenderness  Resp: even and nonlabored, diminished bilateral bases, no vocal fremitus; no wheezes/rales/rhonchi  Cardio: S1, S2 regular, no murmurs/clicks/rubs/gallops  GI: Soft, slight diffuse tenderness, ND, hypoactive bowel sounds, no masses or organomegaly appreciated  Male Genitalia: Deferred, the patient denies priapism  Extremities/MSK: Extremities normal, atraumatic, no cyanosis or edema, negative Homans; Left chest Port-A-Cath  Pulses: 2+ and symmetric  Skin: Skin intact  Neurologic: Grossly intact, no focal deficits  Psych: Appropriate affect   Lab results: Results for orders placed during the hospital encounter of 01/13/12 (from the past 48 hour(s))  CBC WITH DIFFERENTIAL     Status: Abnormal   Collection  Time   01/16/12  6:05 AM      Component Value Range Comment   WBC 20.2 (*) 4.0 - 10.5 K/uL    RBC 2.64 (*) 4.22 - 5.81 MIL/uL    Hemoglobin 7.9 (*) 13.0 - 17.0 g/dL    HCT 16.1 (*) 09.6 - 52.0 %    MCV 89.8  78.0 - 100.0 fL    MCH 29.9  26.0 - 34.0 pg    MCHC 33.3  30.0 - 36.0 g/dL    RDW 04.5 (*) 40.9 - 15.5 %    Platelets 472 (*) 150 - 400 K/uL    Neutrophils Relative 72  43 - 77 %    Lymphocytes Relative 11 (*) 12 - 46 %    Monocytes Relative 11  3 - 12 %    Eosinophils Relative 5  0 - 5 %    Basophils Relative 1  0 - 1 %    Neutro Abs 14.6 (*) 1.7 - 7.7 K/uL    Lymphs Abs 2.2  0.7 - 4.0 K/uL    Monocytes Absolute 2.2 (*) 0.1 - 1.0 K/uL    Eosinophils Absolute 1.0 (*) 0.0 - 0.7 K/uL    Basophils Absolute 0.2 (*) 0.0 - 0.1 K/uL    RBC Morphology POLYCHROMASIA PRESENT      WBC Morphology TOXIC GRANULATION   VACUOLATED NEUTROPHILS   Smear Review LARGE PLATELETS PRESENT     CBC WITH DIFFERENTIAL     Status: Abnormal   Collection Time   01/17/12  4:55 AM      Component Value Range Comment   WBC 15.9 (*) 4.0 - 10.5 K/uL    RBC 2.66 (*) 4.22 - 5.81 MIL/uL    Hemoglobin 8.0 (*) 13.0 - 17.0 g/dL    HCT 81.1 (*) 91.4 - 52.0 %    MCV 89.8  78.0 - 100.0 fL    MCH 30.1  26.0 - 34.0 pg    MCHC 33.5  30.0 - 36.0 g/dL    RDW 78.2 (*) 95.6 - 15.5 %    Platelets 446 (*) 150 - 400 K/uL    Neutrophils Relative 62  43 - 77 %    Neutro Abs 9.9 (*) 1.7 - 7.7 K/uL    Lymphocytes Relative 19  12 - 46 %    Lymphs Abs 3.0  0.7 - 4.0 K/uL    Monocytes Relative 14 (*) 3 - 12 %    Monocytes Absolute 2.3 (*) 0.1 - 1.0 K/uL    Eosinophils Relative 5  0 - 5 %    Eosinophils Absolute 0.7  0.0 - 0.7 K/uL    Basophils Relative 0  0 -  1 %    Basophils Absolute 0.1  0.0 - 0.1 K/uL     Medications:    . dextrose 5 % and 0.45% NaCl 100 mL/hr at 01/17/12 1547      . deferoxamine (DESFERAL) IV  2,000 mg Intravenous QHS  . enoxaparin  40 mg Subcutaneous Q24H  . folic acid  1 mg Oral Daily  .  HYDROmorphone PCA 0.3 mg/mL   Intravenous Q4H  . hydroxyurea  500 mg Oral BID  . lisinopril  10 mg Oral Daily  . OLANZapine  10 mg Oral QHS  . OxyCODONE  80 mg Oral Q12H  . polyethylene glycol  17 g Oral Daily  . potassium chloride SA  20 mEq Oral BID  . senna-docusate  1 tablet Oral BID    I  have reviewed scheduled and prn medications.   Studies/Results: Dg Ugi W/small Bowel  01/16/2012  *RADIOLOGY REPORT*  Clinical Data:Intermittent nausea and vomiting.  Sickle cell.  UPPER GI W/ SMALL BOWEL  Technique: Upper GI series performed with high density barium and effervescent agent. Thin barium also used.  Subsequently, serial images of the small bowel were obtained including spot views of the terminal ileum.  Fluoroscopy Time: 2.0 minutes  Comparison: None.  Findings: Scout view of the abdomen shows stool throughout the colon.  Surgical clips in the right upper quadrant.  No unexpected radiopaque calculi.  Right hip arthroplasty.  Double contrast examination of the upper gastrointestinal tract shows normal esophageal motility.  No esophageal fold thickening, stricture or obstruction.  Stomach and duodenal bulb are normal.  Small bowel transit time is approximately 5 hours and 15 minutes. Small bowel fold pattern is normal.  Terminal ileum is unremarkable.  IMPRESSION: Severe constipation.   Original Report Authenticated By: Leanna Battles, M.D.     Patient Active Problem List  Diagnosis  . Sickle cell pain crisis  . Leukocytosis  . Chronic pain  . Hypokalemia  . Metabolic acidosis  . Thrombocytosis  . Hemochromatosis  . Hypomagnesemia  . Acute chest syndrome due to sickle cell crisis  . Diarrhea  . Elevated brain natriuretic peptide (BNP) level  . Bronchitis  . Sickle cell anemia  . Acute embolism and thrombosis of right internal jugular vein  . Avascular necrosis  . Metabolic encephalopathy  . Increased ammonia level  . Hemoglobin S-S disease  . Sickle cell anemia with crisis  .  Secondary hemochromatosis  . Sickle cell hemolytic anemia  . Elevated bilirubin  . Emesis  . Constipation     Assessment/Plan: 1) Sickle Cell Vaso-occlusive Crisis- Retic 13.8% on adm with T. Bili 3.2 on 12/15; pain improved on PCA Dilaudid but remains uncontrolled; continue gentle IV hydration, anti-emetic prn, anti-pruretic prn; and bowel management regimen for constipation as noted below; most recent Hgb electrophoresis on 12/17/11 with Hgb A 50.2, Hgb F 6.1% (prev 5.5 on 11/07/11), and Hgb S 40.6% (prev 67.7%); on Hydrea (500mg  BID) and folic Acid daily; MCV 89.8 this am and < 100 as far back as 11/08/10; will increase Hydrea 1000 mg am and continue 500 mg qhs; stress ongoing importance/ compliance Hydrea; continue Lovenox DVT; follow  2) Acute on Chronic Pain- improved with PCA Dilaudid; reviewed usage 28.32 mg/24hrs (Rx mglong-acting Oxycontin 80 mg BID; Toradol and heat for adjunct therapy. 3) Left Side CP/history ACS- CXR 01/14/12 bronchitic changes, no focal consolidation; completed 4 day course azithromycin today; suspect underlying costochondritis; remains on IV Toradol; encouraged to continue Incentive Spirometry and increase ambulation; follow  4) Anemia of SCD- Hgb 8.0; trending up (7.0-->7.7-->7.9); follow   5) N/V/Constipation- projectile vomiting resolved; Upper GI revealed severe constipation; on Miralax qd, Senna-S BID without results; Magnesium Citrate today; if no relief; SMOG tomorrow  6) Leukocytosis- 15.9 this am and trending down (20.2 on yesterday); afebrile; suspect secondary to inflammatory process SCD; follow 7) Vascular Access- recent left subclavian port-a-cath placement Dr. Abbey Chatters on 01/05/12; functioning well 8) Secondary Hemochromatosis- most recent Ferritin level 2022 on 12/13/11; continue IV Desferal while inpatient; resume Exjade 500mg  po daily at discharge; follow  9) HTN- controlled on Lisinopril 10mg  po daily for cardiorenal protection; follow 10) Chronic  Hypokalemia- on home dose po supplement; most recent K level; repeat labs pending in am; follow   11) Mood Disorder- stable on Zyprexa   12) Disposition- discharge when severe constipation improved and able to manage his pain at home  The plan of care for this patient has been discussed with Dr. Lawrence Marseilles. Horton Finer, Waddell Iten S 01/17/2012 4:29 PM  LOS: 4 days

## 2012-01-17 NOTE — Progress Notes (Signed)
Pt seen and examined and discussed with NP Anna Kirk. Agree with assessment and plan. 

## 2012-01-18 DIAGNOSIS — R17 Unspecified jaundice: Secondary | ICD-10-CM

## 2012-01-18 LAB — COMPREHENSIVE METABOLIC PANEL
AST: 48 U/L — ABNORMAL HIGH (ref 0–37)
Albumin: 3.6 g/dL (ref 3.5–5.2)
Alkaline Phosphatase: 109 U/L (ref 39–117)
BUN: 6 mg/dL (ref 6–23)
CO2: 24 mEq/L (ref 19–32)
Chloride: 98 mEq/L (ref 96–112)
Creatinine, Ser: 0.56 mg/dL (ref 0.50–1.35)
GFR calc non Af Amer: >90 mL/min (ref >90)
Potassium: 4.2 mEq/L (ref 3.5–5.1)
Total Bilirubin: 2.3 mg/dL — ABNORMAL HIGH (ref 0.3–1.2)

## 2012-01-18 LAB — CBC WITH DIFFERENTIAL/PLATELET
Basophils Relative: 1 % (ref 0–1)
HCT: 21.9 % — ABNORMAL LOW (ref 39.0–52.0)
Hemoglobin: 7.3 g/dL — ABNORMAL LOW (ref 13.0–17.0)
Lymphocytes Relative: 23 % (ref 12–46)
MCHC: 33.3 g/dL (ref 30.0–36.0)
Monocytes Absolute: 2.1 10*3/uL — ABNORMAL HIGH (ref 0.1–1.0)
Monocytes Relative: 15 % — ABNORMAL HIGH (ref 3–12)
Neutro Abs: 7.9 10*3/uL — ABNORMAL HIGH (ref 1.7–7.7)
Neutrophils Relative %: 56 % (ref 43–77)
RBC: 2.45 MIL/uL — ABNORMAL LOW (ref 4.22–5.81)
WBC: 14.1 10*3/uL — ABNORMAL HIGH (ref 4.0–10.5)

## 2012-01-18 MED ORDER — POLYETHYLENE GLYCOL 3350 17 G PO PACK
17.0000 g | PACK | Freq: Two times a day (BID) | ORAL | Status: DC
  Administered 2012-01-18 – 2012-01-20 (×4): 17 g via ORAL
  Filled 2012-01-18 (×5): qty 1

## 2012-01-18 MED ORDER — HYDROXYUREA 500 MG PO CAPS
500.0000 mg | ORAL_CAPSULE | Freq: Every day | ORAL | Status: DC
  Administered 2012-01-18 – 2012-01-22 (×5): 500 mg via ORAL
  Filled 2012-01-18 (×6): qty 1

## 2012-01-18 MED ORDER — LACTULOSE 10 GM/15ML PO SOLN
30.0000 g | Freq: Every day | ORAL | Status: DC
  Administered 2012-01-18 – 2012-01-19 (×2): 30 g via ORAL
  Filled 2012-01-18 (×4): qty 45

## 2012-01-18 MED ORDER — DIPHENHYDRAMINE HCL 50 MG/ML IJ SOLN
25.0000 mg | INTRAMUSCULAR | Status: DC | PRN
  Administered 2012-01-18 – 2012-01-23 (×27): 25 mg via INTRAVENOUS
  Filled 2012-01-18 (×27): qty 1

## 2012-01-18 MED ORDER — HYDROXYUREA 500 MG PO CAPS
1000.0000 mg | ORAL_CAPSULE | Freq: Every day | ORAL | Status: DC
  Administered 2012-01-18 – 2012-01-23 (×6): 1000 mg via ORAL
  Filled 2012-01-18 (×6): qty 2

## 2012-01-18 NOTE — Progress Notes (Signed)
Subjective: [name] was seen on rounds today. He reports having a "good bowel movement" last night and again "just now" but continues to have some nausea without vomiting and early satiety. He also continues to complain of pain to his chest, left side and hips. This pain remains intermittently sharp and without associated SOB. His pain is now rated 5/10 prior to and 3/10 after his pain medication. He desires to continue the PCA pump "a little longer" before transitioning to po as "it just got to a good level". He continues to deny abdominal pain, fever, chills, headache, dizziness, dysuria, hematuria, urinary urgency, frequency, or priapism. Appetite fair but intake poor as noted above.     Allergies  Allergen Reactions  . Morphine And Related Hives and Rash    Pt states he is able to tolerate Dilaudid with no reactions.   Current Facility-Administered Medications  Medication Dose Route Frequency Provider Last Rate Last Dose  . deferoxamine (DESFERAL) 2,000 mg in dextrose 5 % 500 mL infusion  2,000 mg Intravenous QHS Grayce Sessions, NP 125 mL/hr at 01/17/12 2146 2,000 mg at 01/17/12 2146  . dextrose 5 %-0.45 % sodium chloride infusion   Intravenous Continuous Grayce Sessions, NP 100 mL/hr at 01/18/12 0836    . diphenhydrAMINE (BENADRYL) capsule 50 mg  50 mg Oral Q4H PRN Altha Harm, MD   50 mg at 01/17/12 2144  . enoxaparin (LOVENOX) injection 40 mg  40 mg Subcutaneous Q24H Grayce Sessions, NP   40 mg at 01/17/12 1552  . folic acid (FOLVITE) tablet 1 mg  1 mg Oral Daily Gwenyth Bender, MD   1 mg at 01/18/12 1010  . HYDROmorphone (DILAUDID) PCA injection 0.3 mg/mL   Intravenous Q4H Altha Harm, MD   0.6 mg at 01/18/12 0831  . hydroxyurea (HYDREA) capsule 1,000 mg  1,000 mg Oral Daily Lizbeth Bark, FNP   1,000 mg at 01/18/12 1010  . hydroxyurea (HYDREA) capsule 500 mg  500 mg Oral QHS Lizbeth Bark, FNP      . lisinopril (PRINIVIL,ZESTRIL) tablet 10 mg  10 mg Oral Daily Gwenyth Bender, MD   10 mg at 01/18/12 1009  . naloxone [facility]) injection 0.4 mg  0.4 mg Intravenous PRN Altha Harm, MD       And  . sodium chloride 0.9 % injection 9 mL  9 mL Intravenous PRN Altha Harm, MD      . OLANZapine (ZYPREXA) tablet 10 mg  10 mg Oral QHS Gwenyth Bender, MD   10 mg at 01/17/12 2145  . ondansetron (ZOFRAN) tablet 4 mg  4 mg Oral Q4H PRN Gwenyth Bender, MD   4 mg at 01/16/12 0029   Or  . ondansetron [facility]) injection 4 mg  4 mg Intravenous Q4H PRN Gwenyth Bender, MD   4 mg at 01/14/12 1432  . OxyCODONE (OXYCONTIN) 12 hr tablet 80 mg  80 mg Oral Q12H Gwenyth Bender, MD   80 mg at 01/18/12 1009  . polyethylene glycol (MIRALAX / GLYCOLAX) packet 17 g  17 g Oral Daily Altha Harm, MD   17 g at 01/18/12 1009  . potassium chloride SA (K-DUR,KLOR-CON) CR tablet 20 mEq  20 mEq Oral BID Gwenyth Bender, MD   20 mEq at 01/18/12 1009  . senna-docusate (Senokot-S) tablet 1 tablet  1 tablet Oral BID Altha Harm, MD   1 tablet at 01/18/12 1009  . zolpidem (  AMBIEN) tablet 10 mg  10 mg Oral QHS PRN Gwenyth Bender, MD   10 mg at 01/15/12 2230    Objective: Blood pressure 116/70, pulse 82, temperature 98.6 F (37 C), temperature source Oral, resp. rate 20, height 6' (1.829 m), weight 78.2 kg (172 lb 6.4 oz), SpO2 100.00%.  General Appearance: Alert, Ox3, pleasant and cooperative, WD, WN, NAD.  Head: Normocephalic, atraumatic  Eyes: PERRL, slight strabismus, mild icteric sclera  Back: Symmetric, no CVA tenderness  Resp: even and nonlabored, diminished bilateral bases, no vocal fremitus; no wheezes/rales/rhonchi  Cardio: S1, S2 regular, no murmurs/clicks/rubs/gallops  GI: Soft, slight diffuse tenderness, ND, hypoactive bowel sounds, no masses or organomegaly appreciated  Male Genitalia: Deferred, the patient denies priapism  Extremities/MSK: Extremities normal, atraumatic, no cyanosis or edema, negative Homans; Left chest Port-A-Cath  Pulses: 2+ and symmetric  Skin: Skin  intact  Neurologic: Grossly intact, no focal deficits  Psych: Appropriate affect   Lab results: Results for orders placed during the hospital encounter of 01/13/12 (from the past 48 hour(s))  CBC WITH DIFFERENTIAL     Status: Abnormal   Collection Time   01/17/12  4:55 AM      Component Value Range Comment   WBC 15.9 (*) 4.0 - 10.5 K/uL    RBC 2.66 (*) 4.22 - 5.81 MIL/uL    Hemoglobin 8.0 (*) 13.0 - 17.0 g/dL    HCT 96.0 (*) 45.4 - 52.0 %    MCV 89.8  78.0 - 100.0 fL    MCH 30.1  26.0 - 34.0 pg    MCHC 33.5  30.0 - 36.0 g/dL    RDW 09.8 (*) 11.9 - 15.5 %    Platelets 446 (*) 150 - 400 K/uL    Neutrophils Relative 62  43 - 77 %    Neutro Abs 9.9 (*) 1.7 - 7.7 K/uL    Lymphocytes Relative 19  12 - 46 %    Lymphs Abs 3.0  0.7 - 4.0 K/uL    Monocytes Relative 14 (*) 3 - 12 %    Monocytes Absolute 2.3 (*) 0.1 - 1.0 K/uL    Eosinophils Relative 5  0 - 5 %    Eosinophils Absolute 0.7  0.0 - 0.7 K/uL    Basophils Relative 0  0 - 1 %    Basophils Absolute 0.1  0.0 - 0.1 K/uL   CBC WITH DIFFERENTIAL     Status: Abnormal   Collection Time   01/18/12  5:15 AM      Component Value Range Comment   WBC 14.1 (*) 4.0 - 10.5 K/uL    RBC 2.45 (*) 4.22 - 5.81 MIL/uL    Hemoglobin 7.3 (*) 13.0 - 17.0 g/dL    HCT 14.7 (*) 82.9 - 52.0 %    MCV 89.4  78.0 - 100.0 fL    MCH 29.8  26.0 - 34.0 pg    MCHC 33.3  30.0 - 36.0 g/dL    RDW 56.2 (*) 13.0 - 15.5 %    Platelets 400  150 - 400 K/uL    Neutrophils Relative 56  43 - 77 %    Neutro Abs 7.9 (*) 1.7 - 7.7 K/uL    Lymphocytes Relative 23  12 - 46 %    Lymphs Abs 3.2  0.7 - 4.0 K/uL    Monocytes Relative 15 (*) 3 - 12 %    Monocytes Absolute 2.1 (*) 0.1 - 1.0 K/uL    Eosinophils Relative 5  0 - 5 %    Eosinophils Absolute 0.8 (*) 0.0 - 0.7 K/uL    Basophils Relative 1  0 - 1 %    Basophils Absolute 0.1  0.0 - 0.1 K/uL   COMPREHENSIVE METABOLIC PANEL     Status: Abnormal   Collection Time   01/18/12  5:15 AM      Component Value Range  Comment   Sodium 131 (*) 135 - 145 mEq/L    Potassium 4.2  3.5 - 5.1 mEq/L    Chloride 98  96 - 112 mEq/L    CO2 24  19 - 32 mEq/L    Glucose, Bld 111 (*) 70 - 99 mg/dL    BUN 6  6 - 23 mg/dL    Creatinine, Ser 9.60  0.50 - 1.35 mg/dL    Calcium 8.9  8.4 - 45.4 mg/dL    Total Protein 7.2  6.0 - 8.3 g/dL    Albumin 3.6  3.5 - 5.2 g/dL    AST 48 (*) 0 - 37 U/L    ALT 39  0 - 53 U/L    Alkaline Phosphatase 109  39 - 117 U/L    Total Bilirubin 2.3 (*) 0.3 - 1.2 mg/dL    GFR calc non Af Amer >90  >90 mL/min    GFR calc Af Amer >90  >90 mL/min     Medications:    . dextrose 5 % and 0.45% NaCl 100 mL/hr at 01/18/12 0836      . deferoxamine (DESFERAL) IV  2,000 mg Intravenous QHS  . enoxaparin  40 mg Subcutaneous Q24H  . folic acid  1 mg Oral Daily  . HYDROmorphone PCA 0.3 mg/mL   Intravenous Q4H  . hydroxyurea  1,000 mg Oral Daily  . hydroxyurea  500 mg Oral QHS  . lisinopril  10 mg Oral Daily  . OLANZapine  10 mg Oral QHS  . OxyCODONE  80 mg Oral Q12H  . polyethylene glycol  17 g Oral Daily  . potassium chloride SA  20 mEq Oral BID  . senna-docusate  1 tablet Oral BID    I  have reviewed scheduled and prn medications.   Studies/Results: Dg Ugi W/small Bowel  01/16/2012  *RADIOLOGY REPORT*  Clinical Data:Intermittent nausea and vomiting.  Sickle cell.  UPPER GI W/ SMALL BOWEL  Technique: Upper GI series performed with high density barium and effervescent agent. Thin barium also used.  Subsequently, serial images of the small bowel were obtained including spot views of the terminal ileum.  Fluoroscopy Time: 2.0 minutes  Comparison: None.  Findings: Scout view of the abdomen shows stool throughout the colon.  Surgical clips in the right upper quadrant.  No unexpected radiopaque calculi.  Right hip arthroplasty.  Double contrast examination of the upper gastrointestinal tract shows normal esophageal motility.  No esophageal fold thickening, stricture or obstruction.  Stomach and  duodenal bulb are normal.  Small bowel transit time is approximately 5 hours and 15 minutes. Small bowel fold pattern is normal.  Terminal ileum is unremarkable.  IMPRESSION: Severe constipation.   Original Report Authenticated By: Leanna Battles, M.D.     Patient Active Problem List  Diagnosis  . Sickle cell pain crisis  . Leukocytosis  . Chronic pain  . Hypokalemia  . Metabolic acidosis  . Thrombocytosis  . Hemochromatosis  . Hypomagnesemia  . Acute chest syndrome due to sickle cell crisis  . Diarrhea  . Elevated brain natriuretic peptide (BNP) level  . Bronchitis  .  Sickle cell anemia  . Acute embolism and thrombosis of right internal jugular vein  . Avascular necrosis  . Metabolic encephalopathy  . Increased ammonia level  . Hemoglobin S-S disease  . Sickle cell anemia with crisis  . Secondary hemochromatosis  . Sickle cell hemolytic anemia  . Elevated bilirubin  . Emesis  . Constipation     Assessment/Plan: 1) Sickle Cell Vaso-occlusive Crisis- SS Disease; T. Bili 2.3 this am; pain continues to improve on PCA Dilaudid; continue gentle IV hydration, anti-emetic prn, anti-pruretic prn; and bowel management regimen for constipation as noted below; Hgb F 6.1% on 12/17/11 with increased dose Hydrea 1000 mg am and 500 mg qhs on yesterday; continue folic acid daily and Lovenox DVTprophylaxis; follow  2) Acute on Chronic Pain- improved with PCA Dilaudid; reviewed usage 22.58 mg/24hrs with 6 deliveries and 3 attempts since last cleared according to nursing; will continues same including long-acting Oxycontin 80 mg BID, Toradol and heat for adjunct therapy. Plan to transition to po tomorrow 3) Left Side CP/history ACS- CXR 01/14/12 bronchitic changes, no focal consolidation; completed 4 day course azithromycin; suspect underlying costochondritis; remains on IV Toradol; encouraged to continue Incentive Spirometry and increase ambulation; follow  4) Hemolytic Anemia of SCD- Hgb 7.3 this  am; ( 8.0 yesterday); suspect secondary to active hemolysis; follow closely; consider transfusion if < 7.0 and/or symptomatic     5) N/V/Constipation- still having some nausea without vomiting; tolerated diet yesterday (lunch and dinner) with ongoing early satiety; will continue Miralax qd, Senna-S BID; and add Lactulose 30 gm po daily (1200) for now 6) Leukocytosis- WBC 14.1 this am and continues to trend down; remains afebrile; suspect secondary to inflammatory process SCD; follow 7) Vascular Access- recent left subclavian port-a-cath placement Dr. 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 Jon Billings A. Regalado  Lyda Perone, Jameisha Stofko S 01/18/2012 11:32 AM  LOS: 5 days

## 2012-01-18 NOTE — Progress Notes (Signed)
Patient seen and examined. Had BM this am. Still complaining of joint pain.  Care discussed with Vilinda Blanks. NP. Agree with assessment and plan.  Quan Cybulski.

## 2012-01-19 DIAGNOSIS — D5701 Hb-SS disease with acute chest syndrome: Secondary | ICD-10-CM

## 2012-01-19 DIAGNOSIS — M8700 Idiopathic aseptic necrosis of unspecified bone: Secondary | ICD-10-CM

## 2012-01-19 LAB — CBC WITH DIFFERENTIAL/PLATELET
Basophils Absolute: 0.1 10*3/uL (ref 0.0–0.1)
Lymphs Abs: 3.1 10*3/uL (ref 0.7–4.0)
MCH: 30.2 pg (ref 26.0–34.0)
MCV: 88.4 fL (ref 78.0–100.0)
Monocytes Absolute: 1.9 10*3/uL — ABNORMAL HIGH (ref 0.1–1.0)
Neutrophils Relative %: 51 % (ref 43–77)
Platelets: 381 10*3/uL (ref 150–400)
RDW: 19.1 % — ABNORMAL HIGH (ref 11.5–15.5)
WBC: 12 10*3/uL — ABNORMAL HIGH (ref 4.0–10.5)

## 2012-01-19 NOTE — Progress Notes (Signed)
CRITICAL VALUE ALERT  Critical value received:  Hgb 6.8  Date of notification:  01/19/12  Time of notification:  0515  Critical value read back:yes  Nurse who received alert:  Jilda Panda  MD notified (1st page):  Bretta Bang, NP  Time of first page:  0530  MD notified (2nd page):  Time of second page:  Responding MD:  Nash Dimmer  Time MD responded:  [phone]

## 2012-01-19 NOTE — Progress Notes (Signed)
[facility] PROGRESS NOTE  [name] [name] DOB: 09-Jan-1981 DOA: 01/13/2012 PCP: [name], MD   Assessment/Plan:   Principal Problem: *Sickle Cell Vaso-occlusive Crisis  SS Disease; T. Bili 2.3  Pain regimen: dilaudid PCA, Oxycodone 80 mg PO Q 12 hours  Benadryl 25 mg Q 4 hours IV  Continue IV fluids  Antiemetics PRN  Active Problems: Sickle cell anemia  Secondary to hemolytic anemia  Hemoglobin 6.8 today  Will transfuse 1 unit PRBC  Leukocytosis   Secondary to vaso-occlusive crisis  Trending down      Secondary Hemochromatosis  Most recent Ferritin level 2022 on 12/13/11;   continue IV Desferal while inpatient;Exjade 500mg  po daily at discharge Hypertension  controlled with Lisinopril 10mg   Chronic Hypokalemia-   Continue potassium supplements   Potassium WNL Mood Disorder  stable on Zyprexa   Code Status: full code Family Communication: no family at bedside Disposition Plan: home when stable  Manson Passey, MD  [facility] Pager [phone]  If 7PM-7AM, please contact night-coverage [email or web address] Password [email or web address] 01/19/2012, 12:55 PM   LOS: 6 days   HPI/Subjective: No acute overnight events.  Objective: Filed Vitals:   01/19/12 0617 01/19/12 0817 01/19/12 1016 01/19/12 1218  BP: 111/56  108/59   Pulse: 73  77   Temp: 98 F (36.7 C)  98 F (36.7 C)   TempSrc: Oral     Resp: 18 18 20 17   Height:      Weight:      SpO2: 100% 100% 100%     Intake/Output Summary (Last 24 hours) at 01/19/12 1255 Last data filed at 01/19/12 1009  Gross per 24 hour  Intake 4425.33 ml  Output   1550 ml  Net 2875.33 ml    Exam:   General:  Pt is alert, follows commands appropriately, not in acute distress  Cardiovascular: Regular rate and rhythm, S1/S2, no murmurs, no rubs, no gallops  Respiratory: Clear to auscultation bilaterally, no wheezing, no crackles, no rhonchi  Abdomen: Soft, non tender, non distended, bowel sounds  present, no guarding  Extremities: No edema, pulses DP and PT palpable bilaterally  Neuro: Grossly nonfocal  Data Reviewed: Basic Metabolic Panel:  Lab 01/18/12 8295 01/15/12 0530 01/14/12 0520 01/13/12 1132  NA 131* 136 136 137  K 4.2 3.9 3.9 3.7  CL 98 102 105 105  CO2 24 23 25 22   GLUCOSE 111* 118* 97 89  BUN 6 4* 5* 5*  CREATININE 0.56 0.63 0.62 0.61  CALCIUM 8.9 8.9 8.6 8.9   Liver Function Tests:  Lab 01/18/12 0515 01/15/12 0530 01/14/12 0520 01/13/12 1132  AST 48* 30 25 29   ALT 39 16 14 16   ALKPHOS 109 104 92 90  BILITOT 2.3* 3.2* 2.6* 3.3*  PROT 7.2 7.5 6.6 7.2  ALBUMIN 3.6 3.9 3.3* 3.6   CBC:  Lab 01/19/12 0500 01/18/12 0515 01/17/12 0455 01/16/12 0605 01/15/12 0530  WBC 12.0* 14.1* 15.9* 20.2* 21.4*  NEUTROABS 6.2 7.9* 9.9* 14.6* 13.2*  HGB 6.8* 7.3* 8.0* 7.9* 7.7*  HCT 19.9* 21.9* 23.9* 23.7* 23.0*  MCV 88.4 89.4 89.8 89.8 89.8  PLT 381 400 446* 472* 464*     Studies: No results found.  Scheduled Meds:  . deferoxamine   2,000 mg Intravenous QHS  . enoxaparin  40 mg Subcutaneous Q24H  . folic acid  1 mg Oral Daily  . HYDROmorphone PCA    Intravenous Q4H  . hydroxyurea  1,000 mg Oral Daily  . hydroxyurea  500 mg Oral  QHS  . lactulose  30 g Oral Daily  . lisinopril  10 mg Oral Daily  . OLANZapine  10 mg Oral QHS  . OxyCODONE  80 mg Oral Q12H  . polyethylene glycol  17 g Oral BID  . potassium chloride SA  20 mEq Oral BID  . senna-docusate  1 tablet Oral BID   Continuous Infusions:   . dextrose 5 % and 0.45% NaCl 100 mL/hr at 01/19/12 [phone]

## 2012-01-20 LAB — CBC WITH DIFFERENTIAL/PLATELET
Eosinophils Relative: 7 % — ABNORMAL HIGH (ref 0–5)
HCT: 21.2 % — ABNORMAL LOW (ref 39.0–52.0)
Hemoglobin: 7.2 g/dL — ABNORMAL LOW (ref 13.0–17.0)
Lymphocytes Relative: 29 % (ref 12–46)
Lymphs Abs: 3.2 10*3/uL (ref 0.7–4.0)
MCV: 88 fL (ref 78.0–100.0)
Monocytes Absolute: 2 10*3/uL — ABNORMAL HIGH (ref 0.1–1.0)
Monocytes Relative: 18 % — ABNORMAL HIGH (ref 3–12)
Platelets: 394 10*3/uL (ref 150–400)
RBC: 2.41 MIL/uL — ABNORMAL LOW (ref 4.22–5.81)
WBC: 11.1 10*3/uL — ABNORMAL HIGH (ref 4.0–10.5)

## 2012-01-20 MED ORDER — LACTULOSE 10 GM/15ML PO SOLN
30.0000 g | Freq: Once | ORAL | Status: AC
  Administered 2012-01-20: 30 g via ORAL
  Filled 2012-01-20: qty 45

## 2012-01-20 MED ORDER — POLYETHYLENE GLYCOL 3350 17 G PO PACK
17.0000 g | PACK | Freq: Every day | ORAL | Status: DC
  Filled 2012-01-20 (×3): qty 1

## 2012-01-20 MED ORDER — LACTULOSE 10 GM/15ML PO SOLN
30.0000 g | Freq: Two times a day (BID) | ORAL | Status: DC
  Administered 2012-01-20 – 2012-01-22 (×3): 30 g via ORAL
  Filled 2012-01-20 (×7): qty 45

## 2012-01-20 NOTE — Progress Notes (Signed)
Nutrition Brief Note  Patient identified on the Malnutrition Screening Tool (MST) Report  Body mass index is 23.41 kg/(m^2). Pt meets criteria for normal weight based on current BMI.   Current diet order is regular, patient is consuming approximately 100% of meals at this time. Labs and medications reviewed. Pt reports excellent appetite PTA and during admission and stable weight.   Wt Readings from Last 5 Encounters:  01/20/12 172 lb 9.9 oz (78.3 kg)  01/05/12 170 lb 3.1 oz (77.2 kg)  01/05/12 170 lb 3.1 oz (77.2 kg)  01/03/12 170 lb (77.111 kg)  01/03/12 170 lb (77.111 kg)    No nutrition interventions warranted at this time. If nutrition issues arise, please consult RD.   Levon Hedger MS, RD, LDN [phone] Pager [phone] After Hours Pager

## 2012-01-20 NOTE — Progress Notes (Signed)
Subjective: [name] was seen on rounds today. He reports having pain primarily in his "torso" which includes both lower ribs without associated SOB and some in his low back and legs rated 5/10 prior to and 3/10 after his pain medication. He also reports having two good bowel movements yesterday but "not yet" today. He reports Lactulose working better for him than Miralax. He reports tolerating a good breakfast this am. He continues to deny chest pain, palpitations, abdominal pain, fever, chills, headache, dizziness, dysuria, hematuria, urinary urgency, frequency, or priapism. Appetite fair with intake slowly improving.      Allergies  Allergen Reactions  . Morphine And Related Hives and Rash    Pt states he is able to tolerate Dilaudid with no reactions.   Current Facility-Administered Medications  Medication Dose Route Frequency Provider Last Rate Last Dose  . deferoxamine (DESFERAL) 2,000 mg in dextrose 5 % 500 mL infusion  2,000 mg Intravenous QHS Grayce Sessions, NP 125 mL/hr at 01/19/12 2234 2,000 mg at 01/19/12 2234  . dextrose 5 %-0.45 % sodium chloride infusion   Intravenous Continuous Grayce Sessions, NP 100 mL/hr at 01/20/12 1200    . diphenhydrAMINE (BENADRYL) injection 25 mg  25 mg Intravenous Q4H PRN Lizbeth Bark, FNP   25 mg at 01/20/12 1017  . enoxaparin (LOVENOX) injection 40 mg  40 mg Subcutaneous Q24H Grayce Sessions, NP   40 mg at 01/19/12 1421  . folic acid (FOLVITE) tablet 1 mg  1 mg Oral Daily Gwenyth Bender, MD   1 mg at 01/20/12 1225  . HYDROmorphone (DILAUDID) PCA injection 0.3 mg/mL   Intravenous Q4H Altha Harm, MD      . hydroxyurea (HYDREA) capsule 1,000 mg  1,000 mg Oral Daily Lizbeth Bark, FNP   1,000 mg at 01/20/12 1224  . hydroxyurea (HYDREA) capsule 500 mg  500 mg Oral QHS Lizbeth Bark, FNP   500 mg at 01/19/12 2256  . lactulose (CHRONULAC) 10 GM/15ML solution 30 g  30 g Oral BID Lizbeth Bark, FNP      . lisinopril (PRINIVIL,ZESTRIL) tablet 10 mg   10 mg Oral Daily Gwenyth Bender, MD   10 mg at 01/20/12 1225  . naloxone [facility]) injection 0.4 mg  0.4 mg Intravenous PRN Altha Harm, MD       And  . sodium chloride 0.9 % injection 9 mL  9 mL Intravenous PRN Altha Harm, MD      . OLANZapine (ZYPREXA) tablet 10 mg  10 mg Oral QHS Gwenyth Bender, MD   10 mg at 01/19/12 2235  . ondansetron (ZOFRAN) injection 4 mg  4 mg Intravenous Q4H PRN Gwenyth Bender, MD   4 mg at 01/20/12 1016  . OxyCODONE (OXYCONTIN) 12 hr tablet 80 mg  80 mg Oral Q12H Gwenyth Bender, MD   80 mg at 01/20/12 1225  . polyethylene glycol (MIRALAX / GLYCOLAX) packet 17 g  17 g Oral QHS Lizbeth Bark, FNP      . potassium chloride SA (K-DUR,KLOR-CON) CR tablet 20 mEq  20 mEq Oral BID Gwenyth Bender, MD   20 mEq at 01/20/12 1225  . senna-docusate (Senokot-S) tablet 1 tablet  1 tablet Oral BID Altha Harm, MD   1 tablet at 01/20/12 1225  . zolpidem (AMBIEN) tablet 10 mg  10 mg Oral QHS PRN Gwenyth Bender, MD   10 mg at 01/15/12 2230    Objective:  Blood pressure 113/69, pulse 86, temperature 98.3 F (36.8 C), temperature source Oral, resp. rate 17, height 6' (1.829 m), weight 78.3 kg (172 lb 9.9 oz), SpO2 99.00%.  General Appearance: Alert, Ox3, pleasant and cooperative, WD, WN, NAD.  Head: Normocephalic, atraumatic  Eyes: PERRL, slight strabismus, mild icteric sclera  Back: Symmetric, no CVA tenderness  Resp: even and nonlabored, diminished bilateral bases, no vocal fremitus; no wheezes/rales/rhonchi  Cardio: S1, S2 regular, no murmurs/clicks/rubs/gallops  GI: Soft, slight diffuse upper abdominal tenderness, ND, + bowel sounds, no masses or organomegaly appreciated  Male Genitalia: Deferred; denies priapism  Extremities/MSK: Extremities normal, atraumatic, no cyanosis or edema, negative Homans; Left chest Port-A-Cath  Pulses: 2+ and symmetric  Skin: Skin intact  Neurologic: Grossly intact, no focal deficits  Psych: Appropriate affect   Lab results: Results for  orders placed during the hospital encounter of 01/13/12 (from the past 48 hour(s))  CBC WITH DIFFERENTIAL     Status: Abnormal   Collection Time   01/19/12  5:00 AM      Component Value Range Comment   WBC 12.0 (*) 4.0 - 10.5 K/uL    RBC 2.25 (*) 4.22 - 5.81 MIL/uL    Hemoglobin 6.8 (*) 13.0 - 17.0 g/dL    HCT 16.1 (*) 09.6 - 52.0 %    MCV 88.4  78.0 - 100.0 fL    MCH 30.2  26.0 - 34.0 pg    MCHC 34.2  30.0 - 36.0 g/dL    RDW 04.5 (*) 40.9 - 15.5 %    Platelets 381  150 - 400 K/uL    Neutrophils Relative 51  43 - 77 %    Lymphocytes Relative 26  12 - 46 %    Monocytes Relative 16 (*) 3 - 12 %    Eosinophils Relative 6 (*) 0 - 5 %    Basophils Relative 1  0 - 1 %    Neutro Abs 6.2  1.7 - 7.7 K/uL    Lymphs Abs 3.1  0.7 - 4.0 K/uL    Monocytes Absolute 1.9 (*) 0.1 - 1.0 K/uL    Eosinophils Absolute 0.7  0.0 - 0.7 K/uL    Basophils Absolute 0.1  0.0 - 0.1 K/uL   TYPE AND SCREEN     Status: Normal   Collection Time   01/19/12  3:15 PM      Component Value Range Comment   ABO/RH(D) O POS      Antibody Screen NEG      Sample Expiration 01/22/2012      Unit Number [id number]      Blood Component Type RED CELLS,LR      Unit division 00      Status of Unit ISSUED,FINAL      Donor AG Type        Value: NEGATIVE FOR C ANTIGEN NEGATIVE FOR E ANTIGEN NEGATIVE FOR KELL ANTIGEN   Transfusion Status OK TO TRANSFUSE      Crossmatch Result COMPATIBLE     PREPARE RBC (CROSSMATCH)     Status: Normal   Collection Time   01/19/12  3:15 PM      Component Value Range Comment   Order Confirmation ORDER PROCESSED BY BLOOD BANK     CBC WITH DIFFERENTIAL     Status: Abnormal   Collection Time   01/20/12  5:30 AM      Component Value Range Comment   WBC 11.1 (*) 4.0 - 10.5 K/uL    RBC 2.41 (*)  4.22 - 5.81 MIL/uL    Hemoglobin 7.2 (*) 13.0 - 17.0 g/dL    HCT 16.1 (*) 09.6 - 52.0 %    MCV 88.0  78.0 - 100.0 fL    MCH 29.9  26.0 - 34.0 pg    MCHC 34.0  30.0 - 36.0 g/dL    RDW 04.5 (*) 40.9  - 15.5 %    Platelets 394  150 - 400 K/uL    Neutrophils Relative 45  43 - 77 %    Neutro Abs 4.9  1.7 - 7.7 K/uL    Lymphocytes Relative 29  12 - 46 %    Lymphs Abs 3.2  0.7 - 4.0 K/uL    Monocytes Relative 18 (*) 3 - 12 %    Monocytes Absolute 2.0 (*) 0.1 - 1.0 K/uL    Eosinophils Relative 7 (*) 0 - 5 %    Eosinophils Absolute 0.7  0.0 - 0.7 K/uL    Basophils Relative 2 (*) 0 - 1 %    Basophils Absolute 0.2 (*) 0.0 - 0.1 K/uL     Medications:    . dextrose 5 % and 0.45% NaCl 100 mL/hr at 01/20/12 1200      . deferoxamine (DESFERAL) IV  2,000 mg Intravenous QHS  . enoxaparin  40 mg Subcutaneous Q24H  . folic acid  1 mg Oral Daily  . HYDROmorphone PCA 0.3 mg/mL   Intravenous Q4H  . hydroxyurea  1,000 mg Oral Daily  . hydroxyurea  500 mg Oral QHS  . lactulose  30 g Oral BID  . lisinopril  10 mg Oral Daily  . OLANZapine  10 mg Oral QHS  . OxyCODONE  80 mg Oral Q12H  . polyethylene glycol  17 g Oral QHS  . potassium chloride SA  20 mEq Oral BID  . senna-docusate  1 tablet Oral BID    I  have reviewed scheduled and prn medications.  Studies/Results: No results found.  Patient Active Problem List  Diagnosis  . Sickle cell pain crisis  . Leukocytosis  . Chronic pain  . Hypokalemia  . Metabolic acidosis  . Thrombocytosis  . Hemochromatosis  . Hypomagnesemia  . Acute chest syndrome due to sickle cell crisis  . Diarrhea  . Elevated brain natriuretic peptide (BNP) level  . Bronchitis  . Sickle cell anemia  . Acute embolism and thrombosis of right internal jugular vein  . Avascular necrosis  . Metabolic encephalopathy  . Increased ammonia level  . Hemoglobin S-S disease  . Sickle cell anemia with crisis  . Secondary hemochromatosis  . Sickle cell hemolytic anemia  . Elevated bilirubin  . Emesis  . Constipation   Assessment/Plan: 1) Sickle Cell Vaso-occlusive Crisis- SS Disease; most recent T. Bili 2.3; pain continues to improve on PCA Dilaudid; continue gentle  IV hydration, anti-emetic prn, anti-pruretic prn; and bowel management regimen for constipation as noted below; Hgb F 6.1% on 12/17/11 with increased dose Hydrea 1000 mg am and 500 mg qhs this adm; continue folic acid daily and Lovenox DVTprophylaxis; follow  2) Acute on Chronic Pain- He remains on PCA Dilaudid with increased pain and usage felt secondary to PCA interruption for blood transfusion (33.22mg /24hrs); noting need for Dilaudid 12 mg po every 4 hrs as an equivocal amount of IV, will continue current PCA dose for now; same long-acting Oxycontin 80 mg BID, Toradol and heat for adjunct therapy; will need to transition to po prior to discharge 3) Left Side CP/history ACS- slowly improving  on IV Toradol; encouraged to continue Incentive Spirometry and increase ambulation; follow  4) Hemolytic Anemia of SCD- Hgb 7.2 this am s/p transition on yesterday; suspect secondary to ongoing active hemolysis; labs pending in am; follow closely 5) Constipation- tolerating diet but continues to have bilateral lower rib pain and ongoing early satiety; will adjust bowel management regimen to include Lactulose 30 gm BID, Senna S-BID, and decrease Miralax qhs (requires 4-8oz liquid which increases early satiety with meals); follow  6) Leukocytosis- resolving with WBC 11.1 this am; remains afebrile; suspect secondary to inflammatory process SCD; follow 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 Mickle Mallory, ANNA S 01/20/2012 12:58 PM  LOS: 7 days   I have  reviewed assessment and plan above. I agree with the above management. Patient was seen and examined at bedside. We will try to transition to PO analgesics and in regards to constipation we will give lactulose 30 gm BID.  Manson Passey [facility] [phone]

## 2012-01-21 DIAGNOSIS — F39 Unspecified mood [affective] disorder: Secondary | ICD-10-CM | POA: Diagnosis present

## 2012-01-21 DIAGNOSIS — I10 Essential (primary) hypertension: Secondary | ICD-10-CM | POA: Diagnosis present

## 2012-01-21 LAB — COMPREHENSIVE METABOLIC PANEL
CO2: 24 mEq/L (ref 19–32)
Calcium: 8.7 mg/dL (ref 8.4–10.5)
Creatinine, Ser: 0.59 mg/dL (ref 0.50–1.35)
GFR calc Af Amer: >90 mL/min (ref >90)
GFR calc non Af Amer: >90 mL/min (ref >90)
Glucose, Bld: 105 mg/dL — ABNORMAL HIGH (ref 70–99)

## 2012-01-21 LAB — CBC WITH DIFFERENTIAL/PLATELET
Basophils Absolute: 0.2 10*3/uL — ABNORMAL HIGH (ref 0.0–0.1)
Eosinophils Relative: 6 % — ABNORMAL HIGH (ref 0–5)
HCT: 18.8 % — ABNORMAL LOW (ref 39.0–52.0)
Lymphocytes Relative: 26 % (ref 12–46)
Lymphs Abs: 2.6 10*3/uL (ref 0.7–4.0)
MCV: 85.5 fL (ref 78.0–100.0)
Monocytes Absolute: 1.7 10*3/uL — ABNORMAL HIGH (ref 0.1–1.0)
RDW: 19.2 % — ABNORMAL HIGH (ref 11.5–15.5)
WBC: 10 10*3/uL (ref 4.0–10.5)

## 2012-01-21 NOTE — Progress Notes (Signed)
[facility] PROGRESS NOTE  [name] [name] DOB: 1980/12/23 DOA: 01/13/2012 PCP: [name], MD  Brief narrative: [name] is a 31 year old man with a PMH of SS genotype sickle cell anemia who was admitted to the hospital on 01/13/12 with sa vaso-occlusive crisis.  Assessment/Plan: Principal Problem:  *Sickle cell anemia with crisis  Continue IV fluids, antinausea medicines as needed, Hydrea, and folic acid.  Pain management with PCA Dilaudid-HP.  Will transfuse one unit of packed red blood cells today for hemoglobin of 6.5. Active Problems:  Leukocytosis  Likely from vaso-occlusive crisis. No evidence of infection.  Hypokalemia  Corrected.  Secondary hemochromatosis  Continue Desferal IV while inpatient. Resume Exjade at discharge.  Elevated bilirubin  Secondary to hemolysis. Bilirubin slightly up today.  Emesis  Antinausea medications as needed.  Constipation  Continue bowel regimen. The patient reports having bowel movements in the past 24 hours.  Mood disorder  Continue Zyprexa.  Hypertension  Continue lisinopril.  Code Status: Full. Family Communication: None at bedside. Disposition Plan: Home when stable.   Medical Consultants:  None.  Other Consultants:  None.  Anti-infectives:  None.  HPI/Subjective: [name] currently reports a level 5/10 pain, mainly in his ribs with some associated shortness of breath. Also with lower back pains. Reports that his bowels are moving well. Fair appetite.  Objective: Filed Vitals:   01/21/12 0140 01/21/12 0400 01/21/12 0545 01/21/12 0837  BP: 116/68  118/74   Pulse: 76  80   Temp: 98.3 F (36.8 C)  98.2 F (36.8 C)   TempSrc: Oral  Oral   Resp: 16 16 16 15   Height:      Weight:      SpO2: 99% 100% 99% 100%    Intake/Output Summary (Last 24 hours) at 01/21/12 1121 Last data filed at 01/20/12 1900  Gross per 24 hour  Intake   1570 ml  Output   1000 ml  Net     570 ml    Exam: Gen:  NAD Cardiovascular:  RRR, grade 2/6 systolic ejection murmur Respiratory:  Lungs CTAB Gastrointestinal:  Abdomen soft, NT/ND, + BS Extremities:  No C/E/C  Data Reviewed: Basic Metabolic Panel:  Lab 01/21/12 8119 01/18/12 0515 01/15/12 0530  NA 135 131* 136  K 4.0 4.2 --  CL 104 98 102  CO2 24 24 23   GLUCOSE 105* 111* 118*  BUN 8 6 4*  CREATININE 0.59 0.56 0.63  CALCIUM 8.7 8.9 8.9  MG -- -- --  PHOS -- -- --   GFR Estimated Creatinine Clearance: 145.5 ml/min (by C-G formula based on Cr of 0.59). Liver Function Tests:  Lab 01/21/12 0500 01/18/12 0515 01/15/12 0530  AST 71* 48* 30  ALT 53 39 16  ALKPHOS 106 109 104  BILITOT 3.6* 2.3* 3.2*  PROT 7.1 7.2 7.5  ALBUMIN 3.4* 3.6 3.9   CBC:  Lab 01/21/12 0500 01/20/12 0530 01/19/12 0500 01/18/12 0515 01/17/12 0455  WBC 10.0 11.1* 12.0* 14.1* 15.9*  NEUTROABS 4.9 4.9 6.2 7.9* 9.9*  HGB 6.5* 7.2* 6.8* 7.3* 8.0*  HCT 18.8* 21.2* 19.9* 21.9* 23.9*  MCV 85.5 88.0 88.4 89.4 89.8  PLT 345 394 381 400 446*    Procedures and Diagnostic Studies: Dg Chest 2 View  01/14/2012  *RADIOLOGY REPORT*  Clinical Data: Left chest pain, sickle cell crisis  CHEST - 2 VIEW  Comparison: 01/05/2012  Findings: Increased interstitial markings/bronchitic changes.  No focal consolidation. No pleural effusion or pneumothorax.  Stable mild  cardiomegaly.  Left chest power port.  Visualized osseous structures are within normal limits.  Cholecystectomy clips.  IMPRESSION: No evidence of acute cardiopulmonary disease.  Increased interstitial markings/bronchitic changes, stable.   Original Report Authenticated By: Charline Bills, M.D.    Dg [name] W/small Bowel  01/16/2012  *RADIOLOGY REPORT*  Clinical Data:Intermittent nausea and vomiting.  Sickle cell.  UPPER GI W/ SMALL BOWEL  Technique: Upper GI series performed with high density barium and effervescent agent. Thin barium also used.  Subsequently, serial images of the small bowel  were obtained including spot views of the terminal ileum.  Fluoroscopy Time: 2.0 minutes  Comparison: None.  Findings: Scout view of the abdomen shows stool throughout the colon.  Surgical clips in the right upper quadrant.  No unexpected radiopaque calculi.  Right hip arthroplasty.  Double contrast examination of the upper gastrointestinal tract shows normal esophageal motility.  No esophageal fold thickening, stricture or obstruction.  Stomach and duodenal bulb are normal.  Small bowel transit time is approximately 5 hours and 15 minutes. Small bowel fold pattern is normal.  Terminal ileum is unremarkable.  IMPRESSION: Severe constipation.   Original Report Authenticated By: Leanna Battles, M.D.     Scheduled Meds:    . deferoxamine (DESFERAL) IV  2,000 mg Intravenous QHS  . enoxaparin  40 mg Subcutaneous Q24H  . folic acid  1 mg Oral Daily  . HYDROmorphone PCA 0.3 mg/mL   Intravenous Q4H  . hydroxyurea  1,000 mg Oral Daily  . hydroxyurea  500 mg Oral QHS  . lactulose  30 g Oral BID  . lisinopril  10 mg Oral Daily  . OLANZapine  10 mg Oral QHS  . OxyCODONE  80 mg Oral Q12H  . polyethylene glycol  17 g Oral QHS  . potassium chloride SA  20 mEq Oral BID  . senna-docusate  1 tablet Oral BID   Continuous Infusions:    . dextrose 5 % and 0.45% NaCl 100 mL/hr at 01/20/12 2134    Time spent: 25 minutes.   LOS: 8 days   Drishti Pepperman  [facility] Pager [phone].  If 8PM-8AM, please contact night-coverage at [email or web address], password [facility] 01/21/2012, 11:21 AM

## 2012-01-22 LAB — TYPE AND SCREEN
ABO/RH(D): O POS
Antibody Screen: NEGATIVE

## 2012-01-22 LAB — CBC WITH DIFFERENTIAL/PLATELET
Lymphocytes Relative: 26 % (ref 12–46)
Lymphs Abs: 3.1 10*3/uL (ref 0.7–4.0)
Neutrophils Relative %: 54 % (ref 43–77)
Platelets: 385 10*3/uL (ref 150–400)
RBC: 2.6 MIL/uL — ABNORMAL LOW (ref 4.22–5.81)
WBC: 12.3 10*3/uL — ABNORMAL HIGH (ref 4.0–10.5)

## 2012-01-22 MED ORDER — KETOROLAC TROMETHAMINE 30 MG/ML IJ SOLN
30.0000 mg | Freq: Four times a day (QID) | INTRAMUSCULAR | Status: DC | PRN
  Administered 2012-01-22 – 2012-01-23 (×3): 30 mg via INTRAVENOUS
  Filled 2012-01-22 (×3): qty 1

## 2012-01-22 MED ORDER — HYDROMORPHONE HCL PF 1 MG/ML IJ SOLN
1.0000 mg | INTRAMUSCULAR | Status: DC | PRN
  Administered 2012-01-22 – 2012-01-23 (×4): 1 mg via INTRAVENOUS
  Filled 2012-01-22 (×4): qty 1

## 2012-01-22 MED ORDER — HYDROMORPHONE HCL 4 MG PO TABS
12.0000 mg | ORAL_TABLET | ORAL | Status: DC
  Administered 2012-01-22 – 2012-01-23 (×6): 12 mg via ORAL
  Filled 2012-01-22 (×6): qty 3

## 2012-01-22 NOTE — Progress Notes (Signed)
Sickle Cell Service PROGRESS NOTE  [name] [name] DOB: December 22, 1980 DOA: 01/13/2012 PCP: [name], MD  Assessment/Plan: Active Problems:  Sickle cell anemia with crisis: Pt states that pain is down to 7/10. Pt has an expectation that therapy will bring pain down to 3/10 before he can be discharged. Furthermore, pt is resistant to progressing to oral agents prior to discharge and threatens to leave AMA if oral agents prescribed. I have explained that the goal of hospitalization with regard to pain management, is to get pain to a point where it is manageable at home with oral agents. In order to ascertain this his therapy has to be progressed and the effectiveness assessed.   Pt has utilized approximately 34 mg of IV dilaudid in the last 24 hours. I will add Toradol for anti-inflammatory effects. I will also transition to oral dilaudid 12 mg every 4 hours,  and 2 mg IV for breakthrough which is approximately 50% of 24 hour requirement to allow for cross-reactivity. His medication will be further titrated. This was all discussed in the presence of bedside nurse Fred.   PT well hydrated. No need for further high dose fluids. Encourage oral intake.  Continue Hydrea and Folic acid.  Leukocytosis: No S/S of infection. Pt has had a significant decrease in WBC count since admission.  Emesis:Pt had UGI for evaluation of this complaint, he was found to have severe constipation, and has had no emesis.  Secondary hemochromatosis: Continue Desferal  HTN: Well controlled on Lisinopril  Anemia of Sickle Cell: Pt has a baseline Hgb of 7.5-8.0. Currently 7.4 after transfusion of 2 units of PRBC's (12/19, 12/21)   Code Status: Full Code Family Communication: N/A Disposition Plan: Home at time of discharge.  [name] A.  [facility] Pager [phone]. If 7PM-7AM, please contact night-coverage. 01/22/2012, 12:10 PM  LOS: 9 days   Brief narrative: [name] is a 31 year old African American male with SS genotype Sickle cell disease initially presented to the [facility] with Chest pain on Thursday and Friday non radiating upper chest area denies shortness of breath with head ache bilateral temporal area throbbing rated pain as 9/10. Note left side pain became unbearable with intensity of stabbing pain which prompted the admission to the clinic. and Baseline 3/10 tolerable 2-3. Home medication was ineffective.    Consultants:  None  Procedures:  None  Antibiotics:  Azithromycin 12/13 >>12/17  HPI/Subjective: Pt's pain decreases to 7/10 with PCA. Pain is localized to back and legs and is throbbing in nature. No associated symptoms, and no palliative or provocative features. Pt stated that he does not like his IV Dilaudid to be discontinued until he is ready to leave the hospital. Furthermore, if he is transitioned to oral then he would leave AMA if I didn't discharge him. I educated the patient on the appropriate progression of care and the threshold for D/C from the hospital.  Objective: Filed Vitals:   01/22/12 0438 01/22/12 0918 01/22/12 0955 01/22/12 1205  BP: 115/76  117/69   Pulse: 98  81   Temp: 99.1 F (37.3 C)  98.5 F (36.9 C)   TempSrc: Oral  Oral   Resp: 16 16 14 16   Height:      Weight:      SpO2: 100% 100% 100%    Weight change:   Intake/Output Summary (Last 24 hours) at 01/22/12 1210 Last data filed at 01/22/12 0739  Gross per 24 hour  Intake   2480 ml  Output   1200 ml  Net   1280 ml    General: Alert, awake, oriented x3, in no apparent distress.  HEENT: [place]/AT PEERL, EOMI, Anicteric OROPHARYNX:  Moist, No exudate/ erythema/lesions.  Heart: Regular rate and rhythm, without murmurs, rubs, gallops.  Lungs: Clear to auscultation, no wheezing or rhonchi noted.   Abdomen: Soft, nontender, nondistended, positive bowel sounds, no masses no hepatosplenomegaly noted.  Neuro: No focal neurological deficits noted.  Strength normal in bilateral upper and lower extremities. Musculoskeletal: No warm swelling or erythema around joints, no spinal tenderness noted.     Data Reviewed: Basic Metabolic Panel:  Lab 01/21/12 1610 01/18/12 0515  NA 135 131*  K 4.0 4.2  CL 104 98  CO2 24 24  GLUCOSE 105* 111*  BUN 8 6  CREATININE 0.59 0.56  CALCIUM 8.7 8.9  MG -- --  PHOS -- --   Liver Function Tests:  Lab 01/21/12 0500 01/18/12 0515  AST 71* 48*  ALT 53 39  ALKPHOS 106 109  BILITOT 3.6* 2.3*  PROT 7.1 7.2  ALBUMIN 3.4* 3.6   No results found for this basename: LIPASE:5,AMYLASE:5 in the last 168 hours No results found for this basename: AMMONIA:5 in the last 168 hours CBC:  Lab 01/22/12 0532 01/21/12 0500 01/20/12 0530 01/19/12 0500 01/18/12 0515  WBC 12.3* 10.0 11.1* 12.0* 14.1*  NEUTROABS 6.7 4.9 4.9 6.2 7.9*  HGB 7.4* 6.5* 7.2* 6.8* 7.3*  HCT 22.1* 18.8* 21.2* 19.9* 21.9*  MCV 85.0 85.5 88.0 88.4 89.4  PLT 385 345 394 381 400   Cardiac Enzymes: No results found for this basename: CKTOTAL:5,CKMB:5,CKMBINDEX:5,TROPONINI:5 in the last 168 hours BNP (last 3 results)  Basename 07/18/11 1500 07/05/11 0620 06/07/11 0650  PROBNP 13.5 219.1* 7.4   CBG: No results found for this basename: GLUCAP:5 in the last 168 hours  No results found for this or any previous visit (from the past 240 hour(s)).   Studies: Dg Chest 2 View  01/14/2012  *RADIOLOGY REPORT*  Clinical Data: Left chest pain, sickle cell crisis  CHEST - 2 VIEW  Comparison: 01/05/2012  Findings: Increased interstitial markings/bronchitic changes.  No focal consolidation. No pleural effusion or pneumothorax.  Stable mild cardiomegaly.  Left chest power port.  Visualized osseous structures are within normal limits.  Cholecystectomy clips.  IMPRESSION: No evidence of acute cardiopulmonary disease.  Increased interstitial markings/bronchitic changes, stable.   Original Report Authenticated By: Charline Bills, M.D.    Dg Chest Port  1 View  01/05/2012  *RADIOLOGY REPORT*  Clinical Data: Status post Port-A-Cath insertion.  History of sickle cell anemia.  PORTABLE CHEST - 1 VIEW  Comparison: 12/10/2011  Findings: A left subclavian Port-A-Cath has been placed with the catheter tip at the cavoatrial junction.  No pneumothorax is present after placement.  Lungs show chronic disease and bibasilar scarring.  No pulmonary edema or pleural fluid identified.  Cardiac and mediastinal contours are stable.  IMPRESSION: Port-A-Cath tip lies at the cavoatrial junction.  No pneumothorax or other acute findings after placement.   Original Report Authenticated By: Irish Lack, M.D.    Dg Fluoro Guide Cv Line-no Report  01/05/2012  CLINICAL DATA: porta cath insertion   FLOURO GUIDE CV LINE  Fluoroscopy was utilized by the requesting physician.  No radiographic  interpretation.      Scheduled Meds:    . deferoxamine (DESFERAL) IV  2,000 mg Intravenous QHS  . enoxaparin  40 mg Subcutaneous Q24H  . folic acid  1 mg Oral Daily  .  HYDROmorphone  12 mg Oral Q4H  . hydroxyurea  1,000 mg Oral Daily  . hydroxyurea  500 mg Oral QHS  . lactulose  30 g Oral BID  . lisinopril  10 mg Oral Daily  . OLANZapine  10 mg Oral QHS  . OxyCODONE  80 mg Oral Q12H  . polyethylene glycol  17 g Oral QHS  . potassium chloride SA  20 mEq Oral BID  . senna-docusate  1 tablet Oral BID   Continuous Infusions:    . dextrose 5 % and 0.45% NaCl 100 mL/hr at 01/22/12 0455    Principal Problem:  *Sickle cell anemia with crisis Active Problems:  Leukocytosis  Hypokalemia  Hemoglobin S-S disease  Secondary hemochromatosis  Elevated bilirubin  Emesis  Constipation  Mood disorder  Hypertension

## 2012-01-23 LAB — CBC WITH DIFFERENTIAL/PLATELET
Eosinophils Relative: 7 % — ABNORMAL HIGH (ref 0–5)
HCT: 21.3 % — ABNORMAL LOW (ref 39.0–52.0)
Lymphs Abs: 2.8 10*3/uL (ref 0.7–4.0)
MCV: 85.9 fL (ref 78.0–100.0)
Monocytes Relative: 13 % — ABNORMAL HIGH (ref 3–12)
Neutro Abs: 4.7 10*3/uL (ref 1.7–7.7)
Platelets: 436 10*3/uL — ABNORMAL HIGH (ref 150–400)
RBC: 2.48 MIL/uL — ABNORMAL LOW (ref 4.22–5.81)
WBC: 9.7 10*3/uL (ref 4.0–10.5)

## 2012-01-23 MED ORDER — IBUPROFEN 800 MG PO TABS: 800.0000 mg | ORAL_TABLET | Freq: Three times a day (TID) | ORAL | Status: DC | PRN

## 2012-01-23 MED ORDER — POLYETHYLENE GLYCOL 3350 17 G PO PACK: 17.0000 g | PACK | Freq: Every day | ORAL | Status: DC

## 2012-01-23 MED ORDER — OXYCODONE HCL 80 MG PO TB12: 80.0000 mg | ORAL_TABLET | Freq: Two times a day (BID) | ORAL | Status: DC

## 2012-01-23 MED ORDER — HEPARIN SOD (PORK) LOCK FLUSH 100 UNIT/ML IV SOLN
INTRAVENOUS | Status: AC
  Administered 2012-01-23: 500 [IU]
  Filled 2012-01-23: qty 5

## 2012-01-23 MED ORDER — SENNOSIDES-DOCUSATE SODIUM 8.6-50 MG PO TABS: 1.0000 | ORAL_TABLET | Freq: Two times a day (BID) | ORAL | Status: DC

## 2012-01-23 MED ORDER — HYDROMORPHONE HCL 4 MG PO TABS: ORAL_TABLET | ORAL | Status: DC

## 2012-01-23 MED ORDER — DEFERASIROX 500 MG PO TBSO: 500.0000 mg | ORAL_TABLET | Freq: Every day | ORAL | Status: DC

## 2012-01-23 NOTE — Discharge Summary (Signed)
[facility] Discharge Summary  Patient ID: [name] MRN: [id number] DOB/AGE: 1980/11/26 31 y.o.  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 is a 31 year old African American male with SS genotype Sickle cell disease initially presented to the [facility] with a two day history of chest pain non radiating upper chest area and left side pain not associated with shortness of breath. He was also having throbbing bilateral temporal headache's that became unbearable. With his home home medication ineffective, he was admitted for 23 hours of OBS. The persistent pain was unmanageable at home and he was then admitted inpatient for further evaluation and treatment.  1) Sickle Cell Vaso-occlusive Crisis- SS Disease and evidence of Crisis on admission. He was treated with gentle IV hydration, anti-emetic prn, anti-pruretic prn, and Lovenox for DVT prophylaxis.  His pain was aggressively treated and controlled initially with PCA dilaudid. His most recent Hgb electrophoresis was obtained on 12/17/11 in which he had a Hgb A 50.2, Hgb F 6.1%, and Hgb S 40.6%. In response to this lab and recurrent hospital admissions with Vaso-occlusive crisis, the decision was made to increase his dose of Hydrea to 1000 mg am and 500 mg qhs. He will remain on Hydrea and folic acid daily with the need for further evaluation of the effectiveness of the increased dose in the outpatient setting.  2) Acute on Chronic Pain secondary to SCD- His pain was initially treated with PCA  Dilaudid and was difficult to control partly due to his expectation that therapy will bring pain down to 3/10 before he can be discharged. Furthermore, he was resistant to progressing to oral agents prior to discharge and threatened to leave AMA if oral agents prescribed.  Dr. Ashley Royalty explained that the goal of hospitalization with regard to pain management, was to get pain to a point where it is manageable at home with oral agents. In order to ascertain this his therapy has to be progressed and the effectiveness assessed. He was then transitioned to oral dilaudid 12 mg every 4 hours, and given 2 mg IV for severe breakthrough pain, which was approximately 50% of his 24 hour requirements. He also remained on his home dose of long-acting Oxycontin 80 mg BID, and was given IV Toradol and heat for adjunct therapy.   He felt able to manage his pain at home prior to discharge and was discharged on a tapering dose of dilaudid and daily MS-Contin. Ibuprofen has also been added to regimen for PRN dosing (as noted below). Pt to follow up with Dr. [name] in 1-2 weeks of discharge. He has been given a 2 week supply of his narcotic medications. Of note, He has a refill of ambien verified by the pharmacy which he can have refilled.  3) Left Side CP Chest Pain- was further evaluated with a negative CXR on admission and slowly improved with IV Toradol, use of Incentive Spirometry and increase ambulation. This continued to improve and was not problematic at the time of discharge.   4) Hemolytic Anemia of SCD- was followed closely and treated appropriately. He had a baseline  Hgb of 7.5-8.0. His Hgb trended down to 6.5, which was felt secondary to active hemolysis of SCD. He was transfused 2 units of PRBC's (12/19, 12/21) and had a stable, asymptomatic Hgb 7.2 at the time of discharge.  5) Constipation/Projectile Vomiting- episodes of projectile vomiting at various times of the day that was not associated with any other  symptoms and unprovoked was reported on adm. He also reported early satiety and weight loss. This was further evaluated by obtaining an UGI with small bowel on 01/16/12. The results of this study identified severe constipation. This was aggressively treated with a multiple medication bowel management regimen. His GI motility increased and he began having regular bowel movements and  tolerating a regular diet without incident. He will be discharged on Lactulose 30 gm BID, and Senna S-BID. This can be further evaluated and treated in the outpatient setting.   6) Leukocytosis- was noted on admission with a WBC of 16.2. He had a PMH ACS and was empirically placed on Azithromycin and treated from 12/13-12/17/13. He had a negative CXR on 01/14/12 for acute findings. He remained afebrile and this was felt secondary to the inflammatory process of SCD. He was given IV Toradol and this was resolved with a WBC of 9.7 prior to discharge. He was given Rx Motrin 800 mg, 1 tab every 8 hours with food prn for pain and instructed not to exceed more than 20 tabs within a 30 day period.   7) Secondary Hemochromatosis- was addressed this admission noting his most recent Ferritin level 2022 on 12/13/11. This was treated with IV Desferal q hs while inpatient. He was previously discharged on Exjade 500 mg po daily but he has not started this medication in the outpatient setting due to the need for prior authorization. A new Rx was given to the LSCW to assist him with obtaining this medication. This will need to be followed up on with his hospital follow up appointment in the office.   8) HTN- BP's were not problematic and remained controlled with his home dose of Lisinopril 10mg  po daily.   9) Hypokalemia, chronic- was followed closely and treated appropriately by continuing his home dose of potassium supplement. He had a K level of 4.0 prior to discharge, will continue this same dose, and can be further evaluated and treated by  the PCP following him.   10) Mood Disorder- was not problematic and he remained stable on his home dose of Zyprexa. He will continue the same after discharge.   The discharge plan of care for this patient was discussed with Dr. Lawrence Marseilles. Ashley Royalty   Consults: None  Significant Diagnostic Studies:   Dg Chest 2 View   01/14/2012 *RADIOLOGY REPORT* Clinical Data: Left chest pain, sickle cell crisis CHEST - 2 VIEW Comparison: 01/05/2012 Findings: Increased interstitial markings/bronchitic changes. No focal consolidation. No pleural effusion or pneumothorax. Stable mild cardiomegaly. Left chest power port. Visualized osseous structures are within normal limits. Cholecystectomy clips. IMPRESSION: No evidence of acute cardiopulmonary disease. Increased interstitial markings/bronchitic changes, stable. Original Report Authenticated By: Charline Bills, M.D.   Dg Chest Port 1 View   01/05/2012 *RADIOLOGY REPORT* Clinical Data: Status post Port-A-Cath insertion. History of sickle cell anemia. PORTABLE CHEST - 1 VIEW Comparison: 12/10/2011 Findings: A left subclavian Port-A-Cath has been placed with the catheter tip at the cavoatrial junction. No pneumothorax is present after placement. Lungs show chronic disease and bibasilar scarring. No pulmonary edema or pleural fluid identified. Cardiac and mediastinal contours are stable.  IMPRESSION: Port-A-Cath tip lies at the cavoatrial junction. No pneumothorax or other acute findings after placement. Original Report Authenticated By: Irish Lack, M.D.   Dg [name] W/small Bowel   01/16/2012 *RADIOLOGY REPORT* Clinical Data:Intermittent nausea and vomiting. Sickle cell. UPPER GI W/ SMALL BOWEL Technique: Upper GI series performed with high density barium and effervescent agent. Thin barium also used. Subsequently, serial images of the small bowel were obtained including spot views of the terminal ileum. Fluoroscopy Time: 2.0 minutes Comparison: None. Findings:  Scout view of the abdomen shows stool throughout the colon. Surgical clips in the right upper quadrant. No unexpected radiopaque calculi. Right hip arthroplasty. Double contrast examination of the upper gastrointestinal tract shows normal esophageal motility. No esophageal fold thickening, stricture or obstruction. Stomach and duodenal bulb are normal. Small bowel transit time is approximately 5 hours and 15 minutes. Small bowel fold pattern is normal. Terminal ileum is unremarkable. IMPRESSION: Severe constipation. Original Report Authenticated By: Leanna Battles, M.D.   Dg Fluoro Guide Cv Line-no Report   01/05/2012 CLINICAL DATA: porta cath insertion FLOURO GUIDE CV LINE Fluoroscopy was utilized by the requesting physician. No radiographic interpretation.   Treatments: None  Discharge Exam: Blood pressure 112/66, pulse 78, temperature 98.3 F (36.8 C), temperature source Oral, resp. rate 18, height 6' (1.829 m), weight 77.6 kg (171 lb 1.2 oz), SpO2 100.00%.  General Appearance: Alert, Ox3, pleasant and cooperative, WD, WN, NAD.  Head: Normocephalic, atraumatic  Eyes: PERRL, slight strabismus, mild icteric sclera  Back: Symmetric, no CVA tenderness  Resp: even and nonlabored, diminished bilateral bases, no vocal fremitus; no wheezes/rales/rhonchi  Cardio: S1, S2 regular, no murmurs/clicks/rubs/gallops  GI: Soft, slight diffuse upper abdominal tenderness, ND, + bowel sounds, no masses or organomegaly appreciated  Male Genitalia: Deferred; denies priapism  Extremities/MSK: Extremities normal, atraumatic, no cyanosis or edema, negative Homans; Left chest Port-A-Cath  Pulses: 2+ and symmetric  Skin: Skin intact  Neurologic: Grossly intact, no focal deficits  Psych: Appropriate affect   Disposition: 01-Home or Self Care  Discharge Orders    Future Orders Please Complete By Expires   Diet - low sodium heart healthy      Activity as tolerated - No restrictions          Medication List      As of 01/23/2012  1:23 PM    TAKE these medications         deferasirox 500 MG disintegrating tablet   Commonly known as: EXJADE   Take 1 tablet (500 mg total) by mouth daily before breakfast.      folic acid 1 MG tablet   Commonly known as: FOLVITE   Take 1 tablet (1 mg total) by mouth daily.      HYDROmorphone 4 MG tablet   Commonly known as: DILAUDID   8 mg po q 4 hours x 2 days, then 4 mg po q 4 hours prn.      hydroxyurea 500 MG capsule   Commonly known as: HYDREA   Take 1 capsule (500 mg total) by mouth 2 (two) times daily. May take with food to minimize GI side effects.      ibuprofen 800 MG tablet   Commonly known as: ADVIL,MOTRIN   Take 1 tablet (800 mg total) by mouth every 8 (eight) hours as needed for pain. Do not exceed use for more than 20/30 days      lisinopril 10 MG tablet   Commonly known as: PRINIVIL,ZESTRIL   Take 1 tablet (10 mg total)  by mouth daily at 10 pm.      OLANZapine 10 MG tablet   Commonly known as: ZYPREXA   Take 10 mg by mouth at bedtime.      oxyCODONE 80 MG 12 hr tablet   Commonly known as: OXYCONTIN   Take 1 tablet (80 mg total) by mouth every 12 (twelve) hours.      polyethylene glycol packet   Commonly known as: MIRALAX / GLYCOLAX   Take 17 g by mouth at bedtime.      potassium chloride SA 20 MEQ tablet   Commonly known as: K-DUR,KLOR-CON   Take 1 tablet (20 mEq total) by mouth 2 (two) times daily.      senna-docusate 8.6-50 MG per tablet   Commonly known as: Senokot-S   Take 1 tablet by mouth 2 (two) times daily.      zolpidem 10 MG tablet   Commonly known as: AMBIEN   Take 1 tablet (10 mg total) by mouth at bedtime as needed. For sleep           Follow-up Information    Follow up with Gerald Saucer [name], MD. On 02/06/2012. (follow up within 2 wks of discharge; appt 10 am with Gerald Passe, NP)    Contact information:   [address] [place] [place] [address] [phone]          Time Spent on Discharge: > 30  minutes  Signed: Samuel Germany, FNP-C [facility] Pager [phone] 01/23/2012, 1:23 PM  CC: Dr. Willey Blade

## 2012-01-23 NOTE — Progress Notes (Signed)
Pt ready for d/c. Went over new pain medicine regimen with pt, instructed on where to pick up the rest of his medications that were called into his pharmacy. Deaccessed port, WNL. No change in pt condition since AM assessment. Pt d/c'd to home.

## 2012-01-26 NOTE — Discharge Summary (Signed)
Pt seen and examined and discussed with NP Vilinda Blanks. Agree with Assessment and plan and D/C home.

## 2012-01-29 ENCOUNTER — Encounter (HOSPITAL_COMMUNITY): Payer: Self-pay

## 2012-01-29 ENCOUNTER — Non-Acute Institutional Stay (HOSPITAL_COMMUNITY)
Admission: AD | Admit: 2012-01-29 | Discharge: 2012-01-30 | Disposition: A | Discharge status: Home / Self Care | Payer: Medicare Other | Attending: Internal Medicine | Admitting: Internal Medicine

## 2012-01-29 ENCOUNTER — Telehealth (HOSPITAL_COMMUNITY): Payer: Self-pay

## 2012-01-29 DIAGNOSIS — Z79899 Other long term (current) drug therapy: Secondary | ICD-10-CM | POA: Insufficient documentation

## 2012-01-29 DIAGNOSIS — F39 Unspecified mood [affective] disorder: Secondary | ICD-10-CM | POA: Insufficient documentation

## 2012-01-29 DIAGNOSIS — R52 Pain, unspecified: Secondary | ICD-10-CM | POA: Diagnosis not present

## 2012-01-29 DIAGNOSIS — K5900 Constipation, unspecified: Secondary | ICD-10-CM | POA: Insufficient documentation

## 2012-01-29 DIAGNOSIS — E876 Hypokalemia: Secondary | ICD-10-CM | POA: Insufficient documentation

## 2012-01-29 DIAGNOSIS — I10 Essential (primary) hypertension: Secondary | ICD-10-CM | POA: Diagnosis not present

## 2012-01-29 DIAGNOSIS — D72829 Elevated white blood cell count, unspecified: Secondary | ICD-10-CM | POA: Diagnosis not present

## 2012-01-29 DIAGNOSIS — G8929 Other chronic pain: Secondary | ICD-10-CM | POA: Insufficient documentation

## 2012-01-29 DIAGNOSIS — D5700 Hb-SS disease with crisis, unspecified: Secondary | ICD-10-CM | POA: Diagnosis not present

## 2012-01-29 DIAGNOSIS — D57819 Other sickle-cell disorders with crisis, unspecified: Secondary | ICD-10-CM | POA: Diagnosis not present

## 2012-01-29 DIAGNOSIS — R079 Chest pain, unspecified: Secondary | ICD-10-CM | POA: Diagnosis not present

## 2012-01-29 DIAGNOSIS — R1013 Epigastric pain: Secondary | ICD-10-CM | POA: Diagnosis not present

## 2012-01-29 LAB — CBC WITH DIFFERENTIAL/PLATELET
Basophils Absolute: 0.2 10*3/uL — ABNORMAL HIGH (ref 0.0–0.1)
Eosinophils Relative: 1 % (ref 0–5)
HCT: 21.7 % — ABNORMAL LOW (ref 39.0–52.0)
Lymphocytes Relative: 10 % — ABNORMAL LOW (ref 12–46)
MCV: 86.8 fL (ref 78.0–100.0)
Monocytes Absolute: 2 10*3/uL — ABNORMAL HIGH (ref 0.1–1.0)
RDW: 18.8 % — ABNORMAL HIGH (ref 11.5–15.5)
WBC: 23.2 10*3/uL — ABNORMAL HIGH (ref 4.0–10.5)

## 2012-01-29 LAB — COMPREHENSIVE METABOLIC PANEL
AST: 27 U/L (ref 0–37)
BUN: 8 mg/dL (ref 6–23)
CO2: 22 mEq/L (ref 19–32)
Calcium: 8.8 mg/dL (ref 8.4–10.5)
Creatinine, Ser: 0.98 mg/dL (ref 0.50–1.35)
GFR calc Af Amer: >90 mL/min (ref >90)
GFR calc non Af Amer: >90 mL/min (ref >90)
Glucose, Bld: 110 mg/dL — ABNORMAL HIGH (ref 70–99)

## 2012-01-29 MED ORDER — DIPHENHYDRAMINE HCL 25 MG PO CAPS: 25.0000 mg | ORAL_CAPSULE | ORAL | Status: DC | PRN

## 2012-01-29 MED ORDER — HYDROXYUREA 500 MG PO CAPS
500.0000 mg | ORAL_CAPSULE | Freq: Two times a day (BID) | ORAL | Status: DC
  Administered 2012-01-29 – 2012-01-30 (×2): 500 mg via ORAL
  Filled 2012-01-29 (×2): qty 1

## 2012-01-29 MED ORDER — DIPHENHYDRAMINE HCL 50 MG/ML IJ SOLN
12.5000 mg | INTRAMUSCULAR | Status: DC | PRN
  Administered 2012-01-29 (×2): 12.5 mg via INTRAVENOUS
  Administered 2012-01-29 – 2012-01-30 (×3): 25 mg via INTRAVENOUS
  Filled 2012-01-29 (×5): qty 1

## 2012-01-29 MED ORDER — HYDROMORPHONE HCL PF 2 MG/ML IJ SOLN
2.0000 mg | INTRAMUSCULAR | Status: DC | PRN
  Administered 2012-01-29 – 2012-01-30 (×6): 4 mg via INTRAVENOUS
  Administered 2012-01-30: 2 mg via INTRAVENOUS
  Administered 2012-01-30: 4 mg via INTRAVENOUS
  Administered 2012-01-30: 3 mg via INTRAVENOUS
  Administered 2012-01-30: 4 mg via INTRAVENOUS
  Administered 2012-01-30: 2 mg via INTRAVENOUS
  Administered 2012-01-30: 4 mg via INTRAVENOUS
  Filled 2012-01-29: qty 2
  Filled 2012-01-29: qty 1
  Filled 2012-01-29 (×8): qty 2
  Filled 2012-01-29: qty 1

## 2012-01-29 MED ORDER — OXYCODONE HCL ER 20 MG PO T12A
80.0000 mg | EXTENDED_RELEASE_TABLET | Freq: Two times a day (BID) | ORAL | Status: DC
  Administered 2012-01-29 – 2012-01-30 (×2): 80 mg via ORAL
  Filled 2012-01-29 (×2): qty 4

## 2012-01-29 MED ORDER — DEXTROSE 5 % IV SOLN
2000.0000 mg | Freq: Every day | INTRAVENOUS | Status: DC
  Administered 2012-01-29: 2000 mg via INTRAVENOUS
  Filled 2012-01-29 (×3): qty 2

## 2012-01-29 MED ORDER — FOLIC ACID 1 MG PO TABS
1.0000 mg | ORAL_TABLET | Freq: Every day | ORAL | Status: DC
  Administered 2012-01-30: 1 mg via ORAL
  Filled 2012-01-29: qty 1

## 2012-01-29 MED ORDER — ZOLPIDEM TARTRATE 5 MG PO TABS
10.0000 mg | ORAL_TABLET | Freq: Every evening | ORAL | Status: DC | PRN
  Administered 2012-01-29: 10 mg via ORAL
  Filled 2012-01-29: qty 2

## 2012-01-29 MED ORDER — POTASSIUM CHLORIDE 10 MEQ/100ML IV SOLN
10.0000 meq | INTRAVENOUS | Status: AC
  Administered 2012-01-29 (×2): 10 meq via INTRAVENOUS
  Filled 2012-01-29 (×2): qty 100

## 2012-01-29 MED ORDER — ONDANSETRON HCL 4 MG PO TABS: 4.0000 mg | ORAL_TABLET | ORAL | Status: DC | PRN

## 2012-01-29 MED ORDER — SENNOSIDES-DOCUSATE SODIUM 8.6-50 MG PO TABS
1.0000 | ORAL_TABLET | Freq: Two times a day (BID) | ORAL | Status: DC
  Administered 2012-01-29: 1 via ORAL
  Filled 2012-01-29 (×2): qty 1

## 2012-01-29 MED ORDER — ONDANSETRON HCL 4 MG/2ML IJ SOLN
4.0000 mg | INTRAMUSCULAR | Status: DC | PRN
  Administered 2012-01-29 – 2012-01-30 (×6): 4 mg via INTRAVENOUS
  Filled 2012-01-29 (×5): qty 2

## 2012-01-29 MED ORDER — IBUPROFEN 800 MG PO TABS: 800.0000 mg | ORAL_TABLET | Freq: Three times a day (TID) | ORAL | Status: DC | PRN

## 2012-01-29 MED ORDER — LISINOPRIL 10 MG PO TABS
10.0000 mg | ORAL_TABLET | Freq: Every day | ORAL | Status: DC
  Administered 2012-01-29: 10 mg via ORAL
  Filled 2012-01-29 (×3): qty 1

## 2012-01-29 MED ORDER — POTASSIUM CHLORIDE CRYS ER 20 MEQ PO TBCR
40.0000 meq | EXTENDED_RELEASE_TABLET | Freq: Once | ORAL | Status: AC
  Administered 2012-01-29: 40 meq via ORAL
  Filled 2012-01-29: qty 2

## 2012-01-29 MED ORDER — POTASSIUM CHLORIDE CRYS ER 20 MEQ PO TBCR
20.0000 meq | EXTENDED_RELEASE_TABLET | Freq: Two times a day (BID) | ORAL | Status: DC
  Administered 2012-01-30: 20 meq via ORAL
  Filled 2012-01-29: qty 1

## 2012-01-29 MED ORDER — POLYETHYLENE GLYCOL 3350 17 G PO PACK
17.0000 g | PACK | Freq: Every day | ORAL | Status: DC
  Administered 2012-01-29: 17 g via ORAL
  Filled 2012-01-29 (×3): qty 1

## 2012-01-29 MED ORDER — DEXTROSE-NACL 5-0.45 % IV SOLN
INTRAVENOUS | Status: DC
  Administered 2012-01-29: 14:00:00 via INTRAVENOUS

## 2012-01-29 NOTE — H&P (Signed)
[facility] History and Physical   Date: 01/29/2012  Patient name: [name] Medical record number: [id number] Date of birth: 11/30/1980 Age: 31 y.o. Gender: male PCP: [name], [name], MD  Attending physician: Gwenyth Bender, MD  Chief Complaint: pain ribs & stomach   History of Present Illness History of Present Illness:  [name] is a 31 year old African American male with SS genotype Sickle cell disease initially presented to the [name] with sharp stabbing throbbing rib and stomach pain.  Rated pain as 9/10. Started yesterday upon awaking tried managing pain with home medication found no relief. Called [facility] for evaluation and treatment , Admitted for 23 hour observation.   Baseline 3/10 pain   Meds: Prescriptions prior to admission  Medication Sig Dispense Refill  . folic acid (FOLVITE) 1 MG tablet Take 1 tablet (1 mg total) by mouth daily.  100 tablet  2  . HYDROmorphone (DILAUDID) 4 MG tablet 8 mg po q 4 hours x 2 days, then 4 mg po q 4 hours prn.  96 tablet  0  . hydroxyurea (HYDREA) 500 MG capsule Take 1 capsule (500 mg total) by mouth 2 (two) times daily. May take with food to minimize GI side effects.  60 capsule  2  . ibuprofen (ADVIL,MOTRIN) 800 MG tablet Take 1 tablet (800 mg total) by mouth every 8 (eight) hours as needed for pain. Do not exceed use for more than 20/30 days  30 tablet  0  . lisinopril (PRINIVIL,ZESTRIL) 10 MG tablet Take 1 tablet (10 mg total) by mouth daily at 10 pm.  30 tablet  2  . OLANZapine (ZYPREXA) 10 MG tablet Take 10 mg by mouth at bedtime.      Marland Kitchen oxyCODONE (OXYCONTIN) 80 MG 12 hr tablet Take 1 tablet (80 mg total) by mouth every 12 (twelve) hours.  30 tablet  0  . polyethylene glycol (MIRALAX / GLYCOLAX) packet Take 17 g by mouth at bedtime.  14 each  0  . potassium chloride SA (K-DUR,KLOR-CON) 20 MEQ tablet Take 1 tablet (20 mEq total) by mouth 2 (two) times daily.  60 tablet  1  . senna-docusate (SENOKOT-S) 8.6-50 MG per  tablet Take 1 tablet by mouth 2 (two) times daily.  60 tablet  0  . zolpidem (AMBIEN) 10 MG tablet Take 1 tablet (10 mg total) by mouth at bedtime as needed. For sleep  30 tablet  2  . deferasirox (EXJADE) 500 MG disintegrating tablet Take 1 tablet (500 mg total) by mouth daily before breakfast.  30 tablet  0    Allergies: Morphine and related Past Medical History  Diagnosis Date  . Sickle cell anemia   . Blood transfusion   . 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 Hypokalemia   . Mood disorder   . Pulmonary embolism   . Avascular necrosis   . Leukocytosis     Chronic  . Thrombocytosis     Chronic  . Hypertension    Past Surgical History  Procedure Date  . Right hip replacement     08/2006  . Cholecystectomy     01/2008  . Porta cath placement   . Porta cath removal   . Umbilical hernia repair     01/2008  . Excision of left periauricular cyst     10/2009  . Excision of right ear lobe cyst with primary closur     11/2007  . Portacath placement 01/05/2012  Procedure: INSERTION PORT-A-CATH;  Surgeon: Adolph Pollack, MD;  Location: [facility] OR;  Service: General;  Laterality: N/A;  ultrasound guiced port a cath insertion with fluoroscopy   Family History  Problem Relation Age of Onset  . Sickle cell anemia Mother   . Sickle cell anemia Father   . Sickle cell trait Brother    History   Social History  . Marital Status: Single    Spouse Name: N/A    Number of Children: 0  . Years of Education: 13   Occupational History  . Unemployed    Social History Main Topics  . Smoking status: Former Smoker -- 13 years    Types: Cigarettes    Quit date: 07/08/2010  . Smokeless tobacco: Never Used  . Alcohol Use: No  . Drug Use: No     Comment: quit smoking 2011  . Sexually Active: Yes    Birth Control/ Protection: None   Other Topics Concern  . Not on file   Social History Narrative   Single.  Lives with girlfriend.      Review of  Systems: 10 point review negative except noted in HPI  Physical Exam: Blood pressure 110/64, pulse 89, temperature 98.6 F (37 C), temperature source Oral, resp. rate 16, SpO2 98.00%.  General Appearance: Alert and oriented, cooperative, well developed, well nourished, moderate distress  Head: Normocephalic, without obvious abnormality, atraumatic  Eyes: PERRL, slight strabismus, mild icteric sclera  Nose: Nares normal, septum midline, mucosa normal, no drainage or sinus tenderness.  Throat: Normal lips, mucosa, tongue and gums  Back: Symmetric,flank tenderness, lower back tenderness  Resp: even and nonlabored, diminished bilateral bases, no vocal fremitus; no wheezes/rales/rhonchi  Cardio: S1, S2 regular, no murmurs/clicks/rubs/gallops , Left chest porta cath assess GI: Soft, ND, active bowel sounds, no masses, no organomegaly  Male Genitalia: Deferred, the patient denies priapism  Extremities: Extremities normal, atraumatic, no cyanosis or edema, negative Homans sign, no sign of DVTPulses: 2+ and symmetric  Skin: Skin color, texture, turgor normal, no rashes or lesions, numerous tattoos  Neurologic: Grossly normal, CN II-XII intact, no focal deficits  Psych: Appropriate affect   Lab results: Results for orders placed during the hospital encounter of 01/29/12 (from the past 24 hour(s))  RETICULOCYTES     Status: Abnormal   Collection Time   01/29/12  2:15 PM      Component Value Range   Retic Ct Pct 6.3 (*) 0.4 - 3.1 %   RBC. 2.50 (*) 4.22 - 5.81 MIL/uL   Retic Count, Manual 157.5  19.0 - 186.0 K/uL  CBC WITH DIFFERENTIAL     Status: Abnormal   Collection Time   01/29/12  2:15 PM      Component Value Range   WBC 23.2 (*) 4.0 - 10.5 K/uL   RBC 2.50 (*) 4.22 - 5.81 MIL/uL   Hemoglobin 7.5 (*) 13.0 - 17.0 g/dL   HCT 45.4 (*) 09.8 - 11.9 %   MCV 86.8  78.0 - 100.0 fL   MCH 30.0  26.0 - 34.0 pg   MCHC 34.6  30.0 - 36.0 g/dL   RDW 14.7 (*) 82.9 - 56.2 %   Platelets 627 (*) 150 -  400 K/uL   Neutrophils Relative 80 (*) 43 - 77 %   Neutro Abs 18.6 (*) 1.7 - 7.7 K/uL   Lymphocytes Relative 10 (*) 12 - 46 %   Lymphs Abs 2.2  0.7 - 4.0 K/uL   Monocytes Relative 9  3 -  12 %   Monocytes Absolute 2.0 (*) 0.1 - 1.0 K/uL   Eosinophils Relative 1  0 - 5 %   Eosinophils Absolute 0.2  0.0 - 0.7 K/uL   Basophils Relative 1  0 - 1 %   Basophils Absolute 0.2 (*) 0.0 - 0.1 K/uL  COMPREHENSIVE METABOLIC PANEL     Status: Abnormal   Collection Time   01/29/12  2:15 PM      Component Value Range   Sodium 137  135 - 145 mEq/L   Potassium 2.9 (*) 3.5 - 5.1 mEq/L   Chloride 104  96 - 112 mEq/L   CO2 22  19 - 32 mEq/L   Glucose, Bld 110 (*) 70 - 99 mg/dL   BUN 8  6 - 23 mg/dL   Creatinine, Ser 1.61  0.50 - 1.35 mg/dL   Calcium 8.8  8.4 - 09.6 mg/dL   Total Protein 8.1  6.0 - 8.3 g/dL   Albumin 3.8  3.5 - 5.2 g/dL   AST 27  0 - 37 U/L   ALT 26  0 - 53 U/L   Alkaline Phosphatase 98  39 - 117 U/L   Total Bilirubin 3.1 (*) 0.3 - 1.2 mg/dL   GFR calc non Af Amer >90  >90 mL/min   GFR calc Af Amer >90  >90 mL/min    Imaging results:  No results found.   Assessment & Plan: Patient Active Hospital Problem List:  Sickle Cell Vaso-occlusive Crisis-  on adm with T. Bili 3.1  IV hydration, anti-emetic prn, anti-pruretic prn; and b2) Acute on Chronic Pain-IV  Dilaudid continue Oxycontin 80 mg BID Hemolytic Anemia of SCD- Hgb 7.5 asymptomatic  Constipation- continue  on Miralax qd and  Senna-S BID   Leukocytosis- 23.3 probable  Inflammatory/reactive process remains afebrile  Secondary Hemochromatosis-  IV Desferal to night never started  Exjade stated insurance denied with follow up with LCSW  HTN- stable  on Lisinopril 10mg  po daily 10) Chronic Hypokalemia- 2.9  Will receive 2 runs of K+ and now   Mood Disorder- stable on Zyprexa   [name], [name] 01/29/2012, 6:29 PM

## 2012-01-30 DIAGNOSIS — D5700 Hb-SS disease with crisis, unspecified: Secondary | ICD-10-CM | POA: Diagnosis not present

## 2012-01-30 DIAGNOSIS — R079 Chest pain, unspecified: Secondary | ICD-10-CM | POA: Diagnosis not present

## 2012-01-30 DIAGNOSIS — F39 Unspecified mood [affective] disorder: Secondary | ICD-10-CM | POA: Diagnosis not present

## 2012-01-30 DIAGNOSIS — E876 Hypokalemia: Secondary | ICD-10-CM | POA: Diagnosis not present

## 2012-01-30 DIAGNOSIS — R52 Pain, unspecified: Secondary | ICD-10-CM | POA: Diagnosis not present

## 2012-01-30 DIAGNOSIS — I10 Essential (primary) hypertension: Secondary | ICD-10-CM | POA: Diagnosis not present

## 2012-01-30 DIAGNOSIS — D57819 Other sickle-cell disorders with crisis, unspecified: Secondary | ICD-10-CM | POA: Diagnosis not present

## 2012-01-30 DIAGNOSIS — K3189 Other diseases of stomach and duodenum: Secondary | ICD-10-CM | POA: Diagnosis not present

## 2012-01-30 DIAGNOSIS — G8929 Other chronic pain: Secondary | ICD-10-CM | POA: Diagnosis not present

## 2012-01-30 LAB — COMPREHENSIVE METABOLIC PANEL
AST: 24 U/L (ref 0–37)
Albumin: 3.5 g/dL (ref 3.5–5.2)
Alkaline Phosphatase: 101 U/L (ref 39–117)
Chloride: 105 mEq/L (ref 96–112)
Potassium: 3.4 mEq/L — ABNORMAL LOW (ref 3.5–5.1)
Sodium: 135 mEq/L (ref 135–145)
Total Bilirubin: 2.7 mg/dL — ABNORMAL HIGH (ref 0.3–1.2)

## 2012-01-30 LAB — CBC WITH DIFFERENTIAL/PLATELET
Basophils Absolute: 0.2 10*3/uL — ABNORMAL HIGH (ref 0.0–0.1)
Eosinophils Relative: 3 % (ref 0–5)
HCT: 19.8 % — ABNORMAL LOW (ref 39.0–52.0)
Lymphs Abs: 4.8 10*3/uL — ABNORMAL HIGH (ref 0.7–4.0)
MCH: 30.2 pg (ref 26.0–34.0)
MCV: 88 fL (ref 78.0–100.0)
Monocytes Absolute: 2.7 10*3/uL — ABNORMAL HIGH (ref 0.1–1.0)
Neutro Abs: 14.4 10*3/uL — ABNORMAL HIGH (ref 1.7–7.7)
Platelets: 590 10*3/uL — ABNORMAL HIGH (ref 150–400)
RDW: 19.1 % — ABNORMAL HIGH (ref 11.5–15.5)

## 2012-01-30 MED ORDER — POTASSIUM CHLORIDE CRYS ER 20 MEQ PO TBCR
40.0000 meq | EXTENDED_RELEASE_TABLET | Freq: Once | ORAL | Status: AC
  Administered 2012-01-30: 40 meq via ORAL
  Filled 2012-01-30: qty 2

## 2012-01-30 MED ORDER — HYDROMORPHONE HCL PF 2 MG/ML IJ SOLN
2.0000 mg | INTRAMUSCULAR | Status: DC | PRN
  Administered 2012-01-30: 2 mg via INTRAVENOUS
  Filled 2012-01-30: qty 1

## 2012-01-30 MED ORDER — HEPARIN SOD (PORK) LOCK FLUSH 100 UNIT/ML IV SOLN
500.0000 [IU] | INTRAVENOUS | Status: AC | PRN
  Administered 2012-01-30: 500 [IU]
  Filled 2012-01-30: qty 5

## 2012-01-30 MED ORDER — SODIUM CHLORIDE 0.9 % IJ SOLN
10.0000 mL | INTRAMUSCULAR | Status: AC | PRN
  Administered 2012-01-30: 10 mL

## 2012-01-30 NOTE — Progress Notes (Signed)
Called from lab a panic HGB 6.8 down from 7.5, called results to [name]. Who stated she would review when she come in this morning. No orders

## 2012-01-30 NOTE — Progress Notes (Signed)
Pt being discharged to home; discharge orders given and all questions answered; pt told to follow up with doctor's office to make an appointment for a follow-up visit

## 2012-01-30 NOTE — Discharge Summary (Signed)
[facility] Discharge Summary   Patient ID: [name] MRN: [id number] DOB/AGE: 32-Sep-1982 32 y.o.  Admit date: 01/29/2012 Discharge date: 01/30/2012  Primary Care Physician:  Willey Blade, MD  Admission Diagnoses:  Vaso occlusive crisis with active hemolysis Acute on Chronic pain Mood disorder Hypokalemia  Hypertension    Discharge Diagnoses:   Vaso occlusive crisis with active hemolysis Acute on Chronic pain Mood disorder Hypokalemia  Hypertension    Discharge Medications:    Medication List     As of 01/30/2012  5:28 PM    ASK your doctor about these medications         deferasirox 500 MG disintegrating tablet   Commonly known as: EXJADE   Take 1 tablet (500 mg total) by mouth daily before breakfast.      folic acid 1 MG tablet   Commonly known as: FOLVITE   Take 1 tablet (1 mg total) by mouth daily.      HYDROmorphone 4 MG tablet   Commonly known as: DILAUDID   8 mg po q 4 hours x 2 days, then 4 mg po q 4 hours prn.      hydroxyurea 500 MG capsule   Commonly known as: HYDREA   Take 1 capsule (500 mg total) by mouth 2 (two) times daily. May take with food to minimize GI side effects.      ibuprofen 800 MG tablet   Commonly known as: ADVIL,MOTRIN   Take 1 tablet (800 mg total) by mouth every 8 (eight) hours as needed for pain. Do not exceed use for more than 20/30 days      lisinopril 10 MG tablet   Commonly known as: PRINIVIL,ZESTRIL   Take 1 tablet (10 mg total) by mouth daily at 10 pm.      OLANZapine 10 MG tablet   Commonly known as: ZYPREXA   Take 10 mg by mouth at bedtime.      oxyCODONE 80 MG 12 hr tablet   Commonly known as: OXYCONTIN   Take 1 tablet (80 mg total) by mouth every 12 (twelve) hours.      polyethylene glycol packet   Commonly known as: MIRALAX / GLYCOLAX   Take 17 g by mouth at bedtime.      potassium chloride SA 20 MEQ tablet   Commonly known as: K-DUR,KLOR-CON   Take 1 tablet (20 mEq total) by mouth 2  (two) times daily.      senna-docusate 8.6-50 MG per tablet   Commonly known as: Senokot-S   Take 1 tablet by mouth 2 (two) times daily.      zolpidem 10 MG tablet   Commonly known as: AMBIEN   Take 1 tablet (10 mg total) by mouth at bedtime as needed. For sleep         Consults:  None  Significant Diagnostic Studies:  Dg Chest 2 View  01/14/2012  *RADIOLOGY REPORT*  Clinical Data: Left chest pain, sickle cell crisis  CHEST - 2 VIEW  Comparison: 01/05/2012  Findings: Increased interstitial markings/bronchitic changes.  No focal consolidation. No pleural effusion or pneumothorax.  Stable mild cardiomegaly.  Left chest power port.  Visualized osseous structures are within normal limits.  Cholecystectomy clips.  IMPRESSION: No evidence of acute cardiopulmonary disease.  Increased interstitial markings/bronchitic changes, stable.   Original Report Authenticated By: Charline Bills, M.D.    Dg Chest Port 1 View  01/05/2012  *RADIOLOGY REPORT*  Clinical Data: Status post Port-A-Cath insertion.  History of sickle  cell anemia.  PORTABLE CHEST - 1 VIEW  Comparison: 12/10/2011  Findings: A left subclavian Port-A-Cath has been placed with the catheter tip at the cavoatrial junction.  No pneumothorax is present after placement.  Lungs show chronic disease and bibasilar scarring.  No pulmonary edema or pleural fluid identified.  Cardiac and mediastinal contours are stable.  IMPRESSION: Port-A-Cath tip lies at the cavoatrial junction.  No pneumothorax or other acute findings after placement.   Original Report Authenticated By: Irish Lack, M.D.    Dg [name] W/small Bowel  01/16/2012  *RADIOLOGY REPORT*  Clinical Data:Intermittent nausea and vomiting.  Sickle cell.  UPPER GI W/ SMALL BOWEL  Technique: Upper GI series performed with high density barium and effervescent agent. Thin barium also used.  Subsequently, serial images of the small bowel were obtained including spot views of the terminal ileum.   Fluoroscopy Time: 2.0 minutes  Comparison: None.  Findings: Scout view of the abdomen shows stool throughout the colon.  Surgical clips in the right upper quadrant.  No unexpected radiopaque calculi.  Right hip arthroplasty.  Double contrast examination of the upper gastrointestinal tract shows normal esophageal motility.  No esophageal fold thickening, stricture or obstruction.  Stomach and duodenal bulb are normal.  Small bowel transit time is approximately 5 hours and 15 minutes. Small bowel fold pattern is normal.  Terminal ileum is unremarkable.  IMPRESSION: Severe constipation.   Original Report Authenticated By: Leanna Battles, M.D.    Dg Fluoro Guide Cv Line-no Report  01/05/2012  CLINICAL DATA: porta cath insertion   FLOURO GUIDE CV LINE  Fluoroscopy was utilized by the requesting physician.  No radiographic  interpretation.       [facility] Course:  For complete details please refer to admission H and P, but in brief, [name] is a 32 year old African American male with SS genotype Sickle cell disease initially presented to the [facility] with sharp stabbing throbbing rib and stomach pain. Rated pain as 9/10. Vaso occlusive crisis with active hemolysis admission 3.1 at d/c 2.7 will try to manage his pain at home with some improvement.   Hypokalemia  on admission 2.9 and at time of d/c 3.4 replete with 2 runs of K+ and oral of Kdur. Hemochromatosis most recent ferritin 12/13/11 -2022 ketolation therapy of Desferal IV. 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 Chronic/acute pain and crisis was manage by IV hydromorphone and hydration. Chronic constipation secondary to narcotic use continue continued home miralax and senokot. Bipolar mood disorder continued on Zyprexa. Hypertension continue on lisinopril Stable at the time of d/c.  Physical Exam at Discharge:  BP 103/68  Pulse 87  Temp 98.3 F (36.8 C) (Oral)  Resp 18   SpO2 99%  General Appearance: Alert and oriented, cooperative, well developed, well nourished, moderate distress  Eyes: PERRL, slight strabismus, mild icteric sclera  Back: Symmetric,flank tenderness, lower back tenderness  Resp: even and nonlabored, diminished bilateral bases, no vocal fremitus; no wheezes/rales/rhonchi  Cardio: S1, S2 regular, no murmurs/clicks/rubs/gallops , Left chest porta cath assess  GI: Soft, ND, active bowel sounds, no masses, no organomegaly  Male Genitalia: Deferred, the patient denies priapism  Extremities: Extremities normal, atraumatic, no cyanosis or edema, negative Homans sign, no sign of DVTPulses: 2+ and symmetric  Psych: Appropriate affect   Disposition at Discharge: 01-Home or Self Care  Discharge Orders:   Condition at Discharge:   Stable  Time spent on Discharge:  Greater than  30 minutes.  SignedGrayce Sessions 01/30/2012, 5:28 PM

## 2012-02-02 ENCOUNTER — Encounter (HOSPITAL_COMMUNITY): Payer: Self-pay | Admitting: *Deleted

## 2012-02-02 ENCOUNTER — Inpatient Hospital Stay (HOSPITAL_COMMUNITY)
Admission: AD | Admit: 2012-02-02 | Discharge: 2012-02-08 | DRG: 812 | Disposition: A | Discharge status: Home / Self Care | Payer: Medicare Other | Attending: Internal Medicine | Admitting: Internal Medicine

## 2012-02-02 DIAGNOSIS — Z79899 Other long term (current) drug therapy: Secondary | ICD-10-CM | POA: Diagnosis not present

## 2012-02-02 DIAGNOSIS — D72829 Elevated white blood cell count, unspecified: Secondary | ICD-10-CM | POA: Diagnosis not present

## 2012-02-02 DIAGNOSIS — Z96649 Presence of unspecified artificial hip joint: Secondary | ICD-10-CM

## 2012-02-02 DIAGNOSIS — Z86711 Personal history of pulmonary embolism: Secondary | ICD-10-CM | POA: Diagnosis not present

## 2012-02-02 DIAGNOSIS — K3189 Other diseases of stomach and duodenum: Secondary | ICD-10-CM | POA: Diagnosis not present

## 2012-02-02 DIAGNOSIS — K5900 Constipation, unspecified: Secondary | ICD-10-CM | POA: Diagnosis present

## 2012-02-02 DIAGNOSIS — F39 Unspecified mood [affective] disorder: Secondary | ICD-10-CM | POA: Diagnosis not present

## 2012-02-02 DIAGNOSIS — G8929 Other chronic pain: Secondary | ICD-10-CM | POA: Diagnosis present

## 2012-02-02 DIAGNOSIS — I10 Essential (primary) hypertension: Secondary | ICD-10-CM

## 2012-02-02 DIAGNOSIS — E876 Hypokalemia: Secondary | ICD-10-CM | POA: Diagnosis present

## 2012-02-02 DIAGNOSIS — D5700 Hb-SS disease with crisis, unspecified: Secondary | ICD-10-CM | POA: Diagnosis not present

## 2012-02-02 DIAGNOSIS — R0602 Shortness of breath: Secondary | ICD-10-CM | POA: Diagnosis not present

## 2012-02-02 DIAGNOSIS — D571 Sickle-cell disease without crisis: Secondary | ICD-10-CM

## 2012-02-02 DIAGNOSIS — R1013 Epigastric pain: Secondary | ICD-10-CM | POA: Diagnosis not present

## 2012-02-02 DIAGNOSIS — M549 Dorsalgia, unspecified: Secondary | ICD-10-CM | POA: Diagnosis not present

## 2012-02-02 LAB — COMPREHENSIVE METABOLIC PANEL
ALT: 19 U/L (ref 0–53)
AST: 28 U/L (ref 0–37)
Albumin: 4.2 g/dL (ref 3.5–5.2)
Alkaline Phosphatase: 95 U/L (ref 39–117)
Chloride: 102 mEq/L (ref 96–112)
Potassium: 3.5 mEq/L (ref 3.5–5.1)
Sodium: 135 mEq/L (ref 135–145)
Total Protein: 8.3 g/dL (ref 6.0–8.3)

## 2012-02-02 LAB — CBC WITH DIFFERENTIAL/PLATELET
Basophils Absolute: 0.2 10*3/uL — ABNORMAL HIGH (ref 0.0–0.1)
Basophils Relative: 1 % (ref 0–1)
Eosinophils Absolute: 0.3 10*3/uL (ref 0.0–0.7)
HCT: 22.2 % — ABNORMAL LOW (ref 39.0–52.0)
Hemoglobin: 7.6 g/dL — ABNORMAL LOW (ref 13.0–17.0)
MCV: 87.7 fL (ref 78.0–100.0)
Monocytes Absolute: 1.8 10*3/uL — ABNORMAL HIGH (ref 0.1–1.0)
Neutro Abs: 14.1 10*3/uL — ABNORMAL HIGH (ref 1.7–7.7)
Neutrophils Relative %: 76 % (ref 43–77)
RBC: 2.53 MIL/uL — ABNORMAL LOW (ref 4.22–5.81)
RDW: 20.6 % — ABNORMAL HIGH (ref 11.5–15.5)
WBC: 18.4 10*3/uL — ABNORMAL HIGH (ref 4.0–10.5)

## 2012-02-02 LAB — RETICULOCYTES: Retic Ct Pct: 14.2 % — ABNORMAL HIGH (ref 0.4–3.1)

## 2012-02-02 MED ORDER — HYDROMORPHONE HCL PF 2 MG/ML IJ SOLN
2.0000 mg | INTRAMUSCULAR | Status: DC | PRN
  Administered 2012-02-02 (×6): 4 mg via INTRAVENOUS
  Administered 2012-02-03: 2 mg via INTRAVENOUS
  Administered 2012-02-03 (×5): 4 mg via INTRAVENOUS
  Administered 2012-02-03: 2 mg via INTRAVENOUS
  Administered 2012-02-03 (×2): 4 mg via INTRAVENOUS
  Administered 2012-02-03: 2 mg via INTRAVENOUS
  Administered 2012-02-03 – 2012-02-06 (×30): 4 mg via INTRAVENOUS
  Filled 2012-02-02: qty 1
  Filled 2012-02-02 (×34): qty 2
  Filled 2012-02-02: qty 1
  Filled 2012-02-02 (×10): qty 2

## 2012-02-02 MED ORDER — POTASSIUM CHLORIDE CRYS ER 20 MEQ PO TBCR
20.0000 meq | EXTENDED_RELEASE_TABLET | Freq: Two times a day (BID) | ORAL | Status: DC
  Administered 2012-02-02 – 2012-02-08 (×12): 20 meq via ORAL
  Filled 2012-02-02 (×13): qty 1

## 2012-02-02 MED ORDER — ZOLPIDEM TARTRATE 10 MG PO TABS
10.0000 mg | ORAL_TABLET | Freq: Every evening | ORAL | Status: DC | PRN
  Administered 2012-02-03 – 2012-02-05 (×2): 10 mg via ORAL
  Filled 2012-02-02: qty 2
  Filled 2012-02-02 (×2): qty 1

## 2012-02-02 MED ORDER — DEXTROSE-NACL 5-0.45 % IV SOLN
INTRAVENOUS | Status: DC
  Administered 2012-02-02 – 2012-02-05 (×10): via INTRAVENOUS

## 2012-02-02 MED ORDER — OLANZAPINE 10 MG PO TABS
10.0000 mg | ORAL_TABLET | Freq: Every day | ORAL | Status: DC
  Administered 2012-02-02 – 2012-02-07 (×6): 10 mg via ORAL
  Filled 2012-02-02 (×8): qty 1

## 2012-02-02 MED ORDER — LISINOPRIL 10 MG PO TABS
10.0000 mg | ORAL_TABLET | Freq: Every day | ORAL | Status: DC
  Administered 2012-02-02 – 2012-02-07 (×6): 10 mg via ORAL
  Filled 2012-02-02 (×8): qty 1

## 2012-02-02 MED ORDER — DEXTROSE 5 % IV SOLN
2000.0000 mg | Freq: Every day | INTRAVENOUS | Status: DC
  Administered 2012-02-02 – 2012-02-07 (×6): 2000 mg via INTRAVENOUS
  Filled 2012-02-02 (×7): qty 2

## 2012-02-02 MED ORDER — IBUPROFEN 800 MG PO TABS: 800.0000 mg | ORAL_TABLET | Freq: Three times a day (TID) | ORAL | Status: DC | PRN

## 2012-02-02 MED ORDER — SENNOSIDES-DOCUSATE SODIUM 8.6-50 MG PO TABS
1.0000 | ORAL_TABLET | Freq: Two times a day (BID) | ORAL | Status: DC
  Administered 2012-02-02 – 2012-02-08 (×12): 1 via ORAL
  Filled 2012-02-02 (×13): qty 1

## 2012-02-02 MED ORDER — HYDROXYUREA 500 MG PO CAPS
500.0000 mg | ORAL_CAPSULE | Freq: Two times a day (BID) | ORAL | Status: DC
  Administered 2012-02-02 – 2012-02-03 (×2): 500 mg via ORAL
  Filled 2012-02-02 (×2): qty 1

## 2012-02-02 MED ORDER — DIPHENHYDRAMINE HCL 50 MG/ML IJ SOLN
12.5000 mg | INTRAMUSCULAR | Status: DC | PRN
  Administered 2012-02-02 – 2012-02-06 (×21): 25 mg via INTRAVENOUS
  Filled 2012-02-02 (×21): qty 1

## 2012-02-02 MED ORDER — ONDANSETRON HCL 4 MG/2ML IJ SOLN
4.0000 mg | INTRAMUSCULAR | Status: DC | PRN
  Administered 2012-02-02 – 2012-02-08 (×31): 4 mg via INTRAVENOUS
  Filled 2012-02-02 (×32): qty 2

## 2012-02-02 MED ORDER — POLYETHYLENE GLYCOL 3350 17 G PO PACK
17.0000 g | PACK | Freq: Every day | ORAL | Status: DC
  Administered 2012-02-06: 17 g via ORAL
  Filled 2012-02-02 (×8): qty 1

## 2012-02-02 MED ORDER — DIPHENHYDRAMINE HCL 25 MG PO CAPS: 25.0000 mg | ORAL_CAPSULE | ORAL | Status: DC | PRN

## 2012-02-02 MED ORDER — SODIUM CHLORIDE 0.9 % IJ SOLN
10.0000 mL | INTRAMUSCULAR | Status: AC | PRN
  Administered 2012-02-02: 10 mL

## 2012-02-02 MED ORDER — FOLIC ACID 1 MG PO TABS
1.0000 mg | ORAL_TABLET | Freq: Every day | ORAL | Status: DC
  Administered 2012-02-03: 1 mg via ORAL
  Filled 2012-02-02: qty 1

## 2012-02-02 MED ORDER — HEPARIN SOD (PORK) LOCK FLUSH 100 UNIT/ML IV SOLN: 500.0000 [IU] | INTRAVENOUS | Status: DC | PRN

## 2012-02-02 MED ORDER — OXYCODONE HCL ER 20 MG PO T12A
80.0000 mg | EXTENDED_RELEASE_TABLET | Freq: Two times a day (BID) | ORAL | Status: DC
  Administered 2012-02-02 – 2012-02-08 (×11): 80 mg via ORAL
  Filled 2012-02-02 (×11): qty 4

## 2012-02-02 MED ORDER — ONDANSETRON HCL 4 MG PO TABS
4.0000 mg | ORAL_TABLET | ORAL | Status: DC | PRN
  Filled 2012-02-02: qty 1

## 2012-02-02 NOTE — H&P (Signed)
[facility] History and Physical   Date: 02/02/2012  Patient name: [name] Medical record number: [id number] Date of birth: 1980-03-13 Age: 32 y.o. Gender: male PCP: [name], [name], MD  Attending physician: Gwenyth Bender, MD  Chief Complaint: pain ribs & stomach    History of Present Illness:  [name] is a 32 year old African American male with SS genotype Sickle cell disease a PMH of  Acute embolism thrombosis, hypokalemia, mood disorder, PE, avascular necrosis, hypertension.  [facility] with sharp stabbing throbbing rib and stomach pain.  Rated pain as 9/10.  At the beginning of the week was treated at the [facility] he felt was able to manage pain at home tried but pain only continue to  [place]. Admitted for 23 hour observation.   Baseline 3/10 pain   Meds: Prescriptions prior to admission  Medication Sig Dispense Refill  . deferasirox (EXJADE) 500 MG disintegrating tablet Take 1 tablet (500 mg total) by mouth daily before breakfast.  30 tablet  0  . folic acid (FOLVITE) 1 MG tablet Take 1 tablet (1 mg total) by mouth daily.  100 tablet  2  . HYDROmorphone (DILAUDID) 4 MG tablet 8 mg po q 4 hours x 2 days, then 4 mg po q 4 hours prn.  96 tablet  0  . hydroxyurea (HYDREA) 500 MG capsule Take 1 capsule (500 mg total) by mouth 2 (two) times daily. May take with food to minimize GI side effects.  60 capsule  2  . ibuprofen (ADVIL,MOTRIN) 800 MG tablet Take 1 tablet (800 mg total) by mouth every 8 (eight) hours as needed for pain. Do not exceed use for more than 20/30 days  30 tablet  0  . lisinopril (PRINIVIL,ZESTRIL) 10 MG tablet Take 1 tablet (10 mg total) by mouth daily at 10 pm.  30 tablet  2  . OLANZapine (ZYPREXA) 10 MG tablet Take 10 mg by mouth at bedtime.      Marland Kitchen oxyCODONE (OXYCONTIN) 80 MG 12 hr tablet Take 1 tablet (80 mg total) by mouth every 12 (twelve) hours.  30 tablet  0  . polyethylene glycol (MIRALAX / GLYCOLAX) packet Take 17 g by mouth at bedtime.  14  each  0  . potassium chloride SA (K-DUR,KLOR-CON) 20 MEQ tablet Take 1 tablet (20 mEq total) by mouth 2 (two) times daily.  60 tablet  1  . senna-docusate (SENOKOT-S) 8.6-50 MG per tablet Take 1 tablet by mouth 2 (two) times daily.  60 tablet  0  . zolpidem (AMBIEN) 10 MG tablet Take 1 tablet (10 mg total) by mouth at bedtime as needed. For sleep  30 tablet  2    Allergies: Morphine and related Past Medical History  Diagnosis Date  . Sickle cell anemia   . Blood transfusion   . 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 Hypokalemia   . Mood disorder   . Pulmonary embolism   . Avascular necrosis   . Leukocytosis     Chronic  . Thrombocytosis     Chronic  . Hypertension    Past Surgical History  Procedure Date  . Right hip replacement     08/2006  . Cholecystectomy     01/2008  . Porta cath placement   . Porta cath removal   . Umbilical hernia repair     01/2008  . Excision of left periauricular cyst     10/2009  . Excision of  right ear lobe cyst with primary closur     11/2007  . Portacath placement 01/05/2012    Procedure: INSERTION PORT-A-CATH;  Surgeon: Adolph Pollack, MD;  Location: [facility] OR;  Service: General;  Laterality: N/A;  ultrasound guiced port a cath insertion with fluoroscopy   Family History  Problem Relation Age of Onset  . Sickle cell anemia Mother   . Sickle cell anemia Father   . Sickle cell trait Brother    History   Social History  . Marital Status: Single    Spouse Name: N/A    Number of Children: 0  . Years of Education: 13   Occupational History  . Unemployed    Social History Main Topics  . Smoking status: Former Smoker -- 13 years    Types: Cigarettes    Quit date: 07/08/2010  . Smokeless tobacco: Never Used  . Alcohol Use: No  . Drug Use: No     Comment: quit smoking 2011  . Sexually Active: Yes    Birth Control/ Protection: None   Other Topics Concern  . Not on file   Social History Narrative    Single.  Lives with girlfriend.      Review of Systems: 10 point review negative except noted in HPI  Physical Exam: Blood pressure 114/74, pulse 85, temperature 99 F (37.2 C), temperature source Oral, resp. rate 18, SpO2 100.00%.  General Appearance: Alert and oriented, cooperative, well developed, well nourished, moderate distress  Head: Normocephalic, without obvious abnormality, atraumatic  Eyes: PERRL, slight strabismus, mild icteric sclera  Nose: Nares normal, septum midline, mucosa normal, no drainage or sinus tenderness.  Throat: Normal lips, mucosa, tongue and gums  Back: Symmetric,flank tenderness, lower back tenderness  Resp: even and nonlabored, diminished bilateral bases, no vocal fremitus; no wheezes/rales/rhonchi  Cardio: S1, S2 regular, no murmurs/clicks/rubs/gallops , Left chest porta cath assess GI: Soft, ND, active bowel sounds, no masses, no organomegaly  Male Genitalia: Deferred, the patient denies priapism  Extremities: Extremities normal, atraumatic, no cyanosis or edema, negative Homans sign, no sign of DVTPulses: 2+ and symmetric  Skin: Skin color, texture, turgor normal, no rashes or lesions, numerous tattoos  Neurologic: Grossly normal, CN II-XII intact, no focal deficits  Psych: Appropriate affect   Lab results: Results for orders placed during the hospital encounter of 02/02/12 (from the past 24 hour(s))  COMPREHENSIVE METABOLIC PANEL     Status: Abnormal   Collection Time   02/02/12 11:45 AM      Component Value Range   Sodium 135  135 - 145 mEq/L   Potassium 3.5  3.5 - 5.1 mEq/L   Chloride 102  96 - 112 mEq/L   CO2 23  19 - 32 mEq/L   Glucose, Bld 107 (*) 70 - 99 mg/dL   BUN 6  6 - 23 mg/dL   Creatinine, Ser 9.60  0.50 - 1.35 mg/dL   Calcium 9.3  8.4 - 45.4 mg/dL   Total Protein 8.3  6.0 - 8.3 g/dL   Albumin 4.2  3.5 - 5.2 g/dL   AST 28  0 - 37 U/L   ALT 19  0 - 53 U/L   Alkaline Phosphatase 95  39 - 117 U/L   Total Bilirubin 3.5 (*) 0.3 - 1.2  mg/dL   GFR calc non Af Amer >90  >90 mL/min   GFR calc Af Amer >90  >90 mL/min  RETICULOCYTES     Status: Abnormal   Collection Time   02/02/12  11:45 AM      Component Value Range   Retic Ct Pct 14.2 (*) 0.4 - 3.1 %   RBC. 2.53 (*) 4.22 - 5.81 MIL/uL   Retic Count, Manual 359.3 (*) 19.0 - 186.0 K/uL  CBC WITH DIFFERENTIAL     Status: Abnormal   Collection Time   02/02/12 11:45 AM      Component Value Range   WBC 18.4 (*) 4.0 - 10.5 K/uL   RBC 2.53 (*) 4.22 - 5.81 MIL/uL   Hemoglobin 7.6 (*) 13.0 - 17.0 g/dL   HCT 16.1 (*) 09.6 - 04.5 %   MCV 87.7  78.0 - 100.0 fL   MCH 30.0  26.0 - 34.0 pg   MCHC 34.2  30.0 - 36.0 g/dL   RDW 40.9 (*) 81.1 - 91.4 %   Platelets 630 (*) 150 - 400 K/uL   Neutrophils Relative 76  43 - 77 %   Neutro Abs 14.1 (*) 1.7 - 7.7 K/uL   Lymphocytes Relative 11 (*) 12 - 46 %   Lymphs Abs 2.1  0.7 - 4.0 K/uL   Monocytes Relative 10  3 - 12 %   Monocytes Absolute 1.8 (*) 0.1 - 1.0 K/uL   Eosinophils Relative 2  0 - 5 %   Eosinophils Absolute 0.3  0.0 - 0.7 K/uL   Basophils Relative 1  0 - 1 %   Basophils Absolute 0.2 (*) 0.0 - 0.1 K/uL    Imaging results:  No results found.   Assessment & Plan: Patient Active Hospital Problem List:  Sickle Cell Vaso-occlusive Crisis-  on adm with T. Bili 3.5  IV hydration, anti-emetic prn, anti-pruretic prn  Acute on Chronic Pain-IV  Dilaudid continue Oxycontin 80 mg BID Hemolytic Anemia of SCD- Hgb 7.6 asymptomatic  Constipation- secondary to narcotic use continue  on Miralax qd and  Senna-S BID   Leukocytosis- 18.4 probable  Inflammatory/reactive process remains afebrile  Secondary Hemochromatosis-  IV Desferal to night   HTN- stable  on Lisinopril 10mg  po daily   Chronic Hypokalemia- 3.5   Stable at this time   Mood Disorder- stable on Zyprexa   Shaletta Hinostroza P 02/02/2012, 2:00 PM

## 2012-02-03 ENCOUNTER — Encounter (HOSPITAL_COMMUNITY): Payer: Self-pay

## 2012-02-03 DIAGNOSIS — I10 Essential (primary) hypertension: Secondary | ICD-10-CM | POA: Diagnosis not present

## 2012-02-03 DIAGNOSIS — D5700 Hb-SS disease with crisis, unspecified: Secondary | ICD-10-CM | POA: Diagnosis not present

## 2012-02-03 DIAGNOSIS — D72829 Elevated white blood cell count, unspecified: Secondary | ICD-10-CM | POA: Diagnosis not present

## 2012-02-03 DIAGNOSIS — G8929 Other chronic pain: Secondary | ICD-10-CM | POA: Diagnosis present

## 2012-02-03 DIAGNOSIS — E876 Hypokalemia: Secondary | ICD-10-CM | POA: Diagnosis present

## 2012-02-03 DIAGNOSIS — F39 Unspecified mood [affective] disorder: Secondary | ICD-10-CM | POA: Diagnosis not present

## 2012-02-03 DIAGNOSIS — R079 Chest pain, unspecified: Secondary | ICD-10-CM | POA: Diagnosis not present

## 2012-02-03 DIAGNOSIS — R52 Pain, unspecified: Secondary | ICD-10-CM | POA: Diagnosis not present

## 2012-02-03 DIAGNOSIS — Z96649 Presence of unspecified artificial hip joint: Secondary | ICD-10-CM | POA: Diagnosis not present

## 2012-02-03 DIAGNOSIS — Z79899 Other long term (current) drug therapy: Secondary | ICD-10-CM | POA: Diagnosis not present

## 2012-02-03 DIAGNOSIS — Z86711 Personal history of pulmonary embolism: Secondary | ICD-10-CM | POA: Diagnosis not present

## 2012-02-03 DIAGNOSIS — K5900 Constipation, unspecified: Secondary | ICD-10-CM | POA: Diagnosis present

## 2012-02-03 LAB — CBC WITH DIFFERENTIAL/PLATELET
Eosinophils Absolute: 0.7 10*3/uL (ref 0.0–0.7)
Eosinophils Relative: 5 % (ref 0–5)
HCT: 19.6 % — ABNORMAL LOW (ref 39.0–52.0)
Lymphocytes Relative: 25 % (ref 12–46)
Lymphs Abs: 3.8 10*3/uL (ref 0.7–4.0)
MCH: 30.8 pg (ref 26.0–34.0)
MCV: 88.7 fL (ref 78.0–100.0)
Monocytes Absolute: 1.9 10*3/uL — ABNORMAL HIGH (ref 0.1–1.0)
Monocytes Relative: 12 % (ref 3–12)
Platelets: 472 10*3/uL — ABNORMAL HIGH (ref 150–400)
RBC: 2.21 MIL/uL — ABNORMAL LOW (ref 4.22–5.81)
WBC: 15.3 10*3/uL — ABNORMAL HIGH (ref 4.0–10.5)

## 2012-02-03 LAB — COMPREHENSIVE METABOLIC PANEL
AST: 21 U/L (ref 0–37)
Albumin: 3.5 g/dL (ref 3.5–5.2)
BUN: 7 mg/dL (ref 6–23)
Chloride: 105 mEq/L (ref 96–112)
Creatinine, Ser: 0.64 mg/dL (ref 0.50–1.35)
Potassium: 3.3 mEq/L — ABNORMAL LOW (ref 3.5–5.1)
Total Bilirubin: 2.9 mg/dL — ABNORMAL HIGH (ref 0.3–1.2)
Total Protein: 6.9 g/dL (ref 6.0–8.3)

## 2012-02-03 MED ORDER — ENOXAPARIN SODIUM 40 MG/0.4ML ~~LOC~~ SOLN
40.0000 mg | SUBCUTANEOUS | Status: DC
  Administered 2012-02-03 – 2012-02-07 (×5): 40 mg via SUBCUTANEOUS
  Filled 2012-02-03 (×6): qty 0.4

## 2012-02-03 MED ORDER — ENOXAPARIN SODIUM 40 MG/0.4ML ~~LOC~~ SOLN
40.0000 mg | Freq: Every day | SUBCUTANEOUS | Status: DC
  Filled 2012-02-03: qty 0.4

## 2012-02-03 NOTE — Discharge Summary (Signed)
[facility] Discharge Summary   Patient ID: [name] MRN: [id number] DOB/AGE: 33-Feb-1982 33 y.o.  Admit date: 02/02/2012 Discharge date: 02/03/2012  Primary Care Physician:  Gerald Blade, MD  Admission Diagnoses:  Vaso occlusive crisis with active hemolysis Acute on Chronic pain Mood disorder Hypokalemia  Hypertension    Discharge Diagnoses:   Vaso occlusive crisis with active hemolysis Acute on Chronic pain Mood disorder Hypokalemia  Hypertension    Discharge Medications:    Medication List     As of 02/03/2012 12:36 PM    ASK your doctor about these medications         deferasirox 500 MG disintegrating tablet   Commonly known as: EXJADE   Take 1 tablet (500 mg total) by mouth daily before breakfast.      folic acid 1 MG tablet   Commonly known as: FOLVITE   Take 1 tablet (1 mg total) by mouth daily.      HYDROmorphone 4 MG tablet   Commonly known as: DILAUDID   8 mg po q 4 hours x 2 days, then 4 mg po q 4 hours prn.      hydroxyurea 500 MG capsule   Commonly known as: HYDREA   Take 1 capsule (500 mg total) by mouth 2 (two) times daily. May take with food to minimize GI side effects.      ibuprofen 800 MG tablet   Commonly known as: ADVIL,MOTRIN   Take 1 tablet (800 mg total) by mouth every 8 (eight) hours as needed for pain. Do not exceed use for more than 20/30 days      lisinopril 10 MG tablet   Commonly known as: PRINIVIL,ZESTRIL   Take 1 tablet (10 mg total) by mouth daily at 10 pm.      OLANZapine 10 MG tablet   Commonly known as: ZYPREXA   Take 10 mg by mouth at bedtime.      oxyCODONE 80 MG 12 hr tablet   Commonly known as: OXYCONTIN   Take 1 tablet (80 mg total) by mouth every 12 (twelve) hours.      polyethylene glycol packet   Commonly known as: MIRALAX / GLYCOLAX   Take 17 g by mouth at bedtime.      potassium chloride SA 20 MEQ tablet   Commonly known as: K-DUR,KLOR-CON   Take 1 tablet (20 mEq total) by mouth 2 (two)  times daily.      senna-docusate 8.6-50 MG per tablet   Commonly known as: Senokot-S   Take 1 tablet by mouth 2 (two) times daily.      zolpidem 10 MG tablet   Commonly known as: AMBIEN   Take 1 tablet (10 mg total) by mouth at bedtime as needed. For sleep           Consults:  None  Significant Diagnostic Studies:  Dg Chest 2 View  01/14/2012  *RADIOLOGY REPORT*  Clinical Data: Left chest pain, sickle cell crisis  CHEST - 2 VIEW  Comparison: 01/05/2012  Findings: Increased interstitial markings/bronchitic changes.  No focal consolidation. No pleural effusion or pneumothorax.  Stable mild cardiomegaly.  Left chest power port.  Visualized osseous structures are within normal limits.  Cholecystectomy clips.  IMPRESSION: No evidence of acute cardiopulmonary disease.  Increased interstitial markings/bronchitic changes, stable.   Original Report Authenticated By: Charline Bills, M.D.    Dg Chest Port 1 View  01/05/2012  *RADIOLOGY REPORT*  Clinical Data: Status post Port-A-Cath insertion.  History of  sickle cell anemia.  PORTABLE CHEST - 1 VIEW  Comparison: 12/10/2011  Findings: A left subclavian Port-A-Cath has been placed with the catheter tip at the cavoatrial junction.  No pneumothorax is present after placement.  Lungs show chronic disease and bibasilar scarring.  No pulmonary edema or pleural fluid identified.  Cardiac and mediastinal contours are stable.  IMPRESSION: Port-A-Cath tip lies at the cavoatrial junction.  No pneumothorax or other acute findings after placement.   Original Report Authenticated By: Irish Lack, M.D.    Dg [name] W/small Bowel  01/16/2012  *RADIOLOGY REPORT*  Clinical Data:Intermittent nausea and vomiting.  Sickle cell.  UPPER GI W/ SMALL BOWEL  Technique: Upper GI series performed with high density barium and effervescent agent. Thin barium also used.  Subsequently, serial images of the small bowel were obtained including spot views of the terminal ileum.   Fluoroscopy Time: 2.0 minutes  Comparison: None.  Findings: Scout view of the abdomen shows stool throughout the colon.  Surgical clips in the right upper quadrant.  No unexpected radiopaque calculi.  Right hip arthroplasty.  Double contrast examination of the upper gastrointestinal tract shows normal esophageal motility.  No esophageal fold thickening, stricture or obstruction.  Stomach and duodenal bulb are normal.  Small bowel transit time is approximately 5 hours and 15 minutes. Small bowel fold pattern is normal.  Terminal ileum is unremarkable.  IMPRESSION: Severe constipation.   Original Report Authenticated By: Leanna Battles, M.D.    Dg Fluoro Guide Cv Line-no Report  01/05/2012  CLINICAL DATA: porta cath insertion   FLOURO GUIDE CV LINE  Fluoroscopy was utilized by the requesting physician.  No radiographic  interpretation.       [facility] Course:  For complete details please refer to admission H and P, but in brief, [name] is a 32 year old African American male with SS genotype Sickle cell disease initially presented to the [facility] with sharp stabbing throbbing rib and stomach pain. Rated pain as 9/10. Vaso occlusive crisis with active hemolysis admission 3.5 and transferred to inpatient service 2.9. He is still unable to [facility]. Hemochromatosis most recent ferritin 12/13/11 -2022 ketolation therapy of Desferal IV during the night . 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 Chronic/acute pain and crisis was manage by IV hydromorphone and hydration. Chronic constipation secondary to narcotic use continue continued  miralax and senokot. Bipolar mood disorder continued on Zyprexa. Hypertension continue on lisinopril Stable.  Physical Exam at Discharge:  BP 122/90  Pulse 70  Temp 98.6 F (37 C) (Oral)  Resp 18  Ht 6' (1.829 m)  Wt 77.792 kg (171 lb 8 oz)  BMI 23.26 kg/m2  SpO2 98%  General  Appearance: Alert and oriented, cooperative, well developed, well nourished, moderate distress  Eyes: PERRL, slight strabismus, mild icteric sclera  Back: Symmetric,flank tenderness, lower back tenderness  Resp: even and nonlabored, diminished bilateral bases, no vocal fremitus; no wheezes/rales/rhonchi  Cardio: S1, S2 regular, no murmurs/clicks/rubs/gallops , Left chest porta cath assess  GI: Soft, Non tender, active bowel sounds, no masses, no organomegaly , (BM yesterday)  Male Genitalia: Deferred, the patient denies priapism  Extremities: Extremities normal, atraumatic, no cyanosis or edema, negative Homans sign, no sign of DVTPulses: 2+ and symmetric  Psych: Appropriate affect   Disposition at Discharge: Transferred to in patient service   Condition at Discharge:   Stable  Time spent on Discharge:  Greater than 30 minutes.  Signed:  [name], [name] 02/03/2012, 12:36 PM

## 2012-02-03 NOTE — Progress Notes (Signed)
Patient with history of MRSA.  Patient's last MRSA PCR was negative on 01/05/2012.  Patient not placed on isolation per protocol.  [name] [facility]  02/03/2012  4:30 PM

## 2012-02-03 NOTE — Progress Notes (Signed)
Patient ID: [name], male   DOB: 04/23/1980, 32 y.o.   MRN:   Patient admitted to [facility], report given to [facility] RN, vitals stable, no complications. [facility].

## 2012-02-03 NOTE — H&P (Signed)
[facility] History and Physical   Date: 02/03/2012  Patient name: [name] Medical record number: [id number] Date of birth: 1980/04/28 Age: 32 y.o. Gender: male PCP: [name], [name], MD  Attending physician: Gwenyth Bender, MD  Chief Complaint: right sided ribs & stomach pain    History of Present Illness:  [name] is a 33 year old African American male with SS genotype Sickle cell disease a PMH of  Acute embolism thrombosis, hypokalemia, mood disorder, PE, avascular necrosis, hypertension.  [facility] with sharp stabbing throbbing rib and stomach pain.  Rated pain as 9/10.  At the beginning of the week was treated at the [facility] for 23 hr observation felt was able to manage pain at home tried but pain only continue to  Esculate. Admitted for 23 hour observation on yesterday.  Baseline 3/10 pain His pain is still unmanageable at home reluctantly will be admitted for further treatment and evaluation.   Meds: Prescriptions prior to admission  Medication Sig Dispense Refill  . deferasirox (EXJADE) 500 MG disintegrating tablet Take 1 tablet (500 mg total) by mouth daily before breakfast.  30 tablet  0  . folic acid (FOLVITE) 1 MG tablet Take 1 tablet (1 mg total) by mouth daily.  100 tablet  2  . HYDROmorphone (DILAUDID) 4 MG tablet 8 mg po q 4 hours x 2 days, then 4 mg po q 4 hours prn.  96 tablet  0  . hydroxyurea (HYDREA) 500 MG capsule Take 1 capsule (500 mg total) by mouth 2 (two) times daily. May take with food to minimize GI side effects.  60 capsule  2  . ibuprofen (ADVIL,MOTRIN) 800 MG tablet Take 1 tablet (800 mg total) by mouth every 8 (eight) hours as needed for pain. Do not exceed use for more than 20/30 days  30 tablet  0  . lisinopril (PRINIVIL,ZESTRIL) 10 MG tablet Take 1 tablet (10 mg total) by mouth daily at 10 pm.  30 tablet  2  . OLANZapine (ZYPREXA) 10 MG tablet Take 10 mg by mouth at bedtime.      Marland Kitchen oxyCODONE (OXYCONTIN) 80 MG 12 hr tablet Take 1 tablet  (80 mg total) by mouth every 12 (twelve) hours.  30 tablet  0  . polyethylene glycol (MIRALAX / GLYCOLAX) packet Take 17 g by mouth at bedtime.  14 each  0  . potassium chloride SA (K-DUR,KLOR-CON) 20 MEQ tablet Take 1 tablet (20 mEq total) by mouth 2 (two) times daily.  60 tablet  1  . senna-docusate (SENOKOT-S) 8.6-50 MG per tablet Take 1 tablet by mouth 2 (two) times daily.  60 tablet  0  . zolpidem (AMBIEN) 10 MG tablet Take 1 tablet (10 mg total) by mouth at bedtime as needed. For sleep  30 tablet  2    Allergies: Morphine and related Past Medical History  Diagnosis Date  . Sickle cell anemia   . Blood transfusion   . 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 Hypokalemia   . Mood disorder   . Pulmonary embolism   . Avascular necrosis   . Leukocytosis     Chronic  . Thrombocytosis     Chronic  . Hypertension    Past Surgical History  Procedure Date  . Right hip replacement     08/2006  . Cholecystectomy     01/2008  . Porta cath placement   . Porta cath removal   . Umbilical hernia  repair     01/2008  . Excision of left periauricular cyst     10/2009  . Excision of right ear lobe cyst with primary closur     11/2007  . Portacath placement 01/05/2012    Procedure: INSERTION PORT-A-CATH;  Surgeon: Adolph Pollack, MD;  Location: [facility] OR;  Service: General;  Laterality: N/A;  ultrasound guiced port a cath insertion with fluoroscopy   Family History  Problem Relation Age of Onset  . Sickle cell anemia Mother   . Sickle cell anemia Father   . Sickle cell trait Brother    History   Social History  . Marital Status: Single    Spouse Name: N/A    Number of Children: 0  . Years of Education: 13   Occupational History  . Unemployed    Social History Main Topics  . Smoking status: Former Smoker -- 13 years    Types: Cigarettes    Quit date: 07/08/2010  . Smokeless tobacco: Never Used  . Alcohol Use: No  . Drug Use: No     Comment: quit  smoking 2011  . Sexually Active: Yes    Birth Control/ Protection: None   Other Topics Concern  . Not on file   Social History Narrative   Single.  Lives with girlfriend.      Review of Systems: 10 point review negative except noted in HPI  Physical Exam: Blood pressure 122/90, pulse 70, temperature 98.6 F (37 C), temperature source Oral, resp. rate 18, height 6' (1.829 m), weight 77.792 kg (171 lb 8 oz), SpO2 98.00%.  General Appearance: Alert and oriented, cooperative, well developed, well nourished, moderate distress  Head: Normocephalic, without obvious abnormality, atraumatic  Eyes: PERRL, slight strabismus, mild icteric sclera  Nose: Nares normal, septum midline, mucosa normal, no drainage or sinus tenderness.  Throat: Normal lips, mucosa, tongue and gums  Back: Symmetric,flank tenderness, lower back tenderness  Resp: even and nonlabored, diminished bilateral bases, no vocal fremitus; no wheezes/rales/rhonchi  Cardio: S1, S2 regular, no murmurs/clicks/rubs/gallops , Left chest porta cath assess GI: Soft, non tender , active bowel sounds, no masses, no organomegaly (guarded right side) Note BM on yesterday)  Male Genitalia: Deferred, the patient denies priapism  Extremities: Extremities normal, atraumatic, no cyanosis or edema, negative Homans sign, no sign of DVTPulses: 2+ and symmetric  Skin: Skin color, texture, turgor normal, no rashes or lesions, numerous tattoos  Neurologic: Grossly normal, CN II-XII intact, no focal deficits  Psych: Appropriate/flat affect   Lab results: Results for orders placed during the hospital encounter of 02/02/12 (from the past 24 hour(s))  COMPREHENSIVE METABOLIC PANEL     Status: Abnormal   Collection Time   02/03/12  5:35 AM      Component Value Range   Sodium 137  135 - 145 mEq/L   Potassium 3.3 (*) 3.5 - 5.1 mEq/L   Chloride 105  96 - 112 mEq/L   CO2 25  19 - 32 mEq/L   Glucose, Bld 122 (*) 70 - 99 mg/dL   BUN 7  6 - 23 mg/dL    Creatinine, Ser 1.61  0.50 - 1.35 mg/dL   Calcium 8.5  8.4 - 09.6 mg/dL   Total Protein 6.9  6.0 - 8.3 g/dL   Albumin 3.5  3.5 - 5.2 g/dL   AST 21  0 - 37 U/L   ALT 15  0 - 53 U/L   Alkaline Phosphatase 84  39 - 117 U/L   Total  Bilirubin 2.9 (*) 0.3 - 1.2 mg/dL   GFR calc non Af Amer >90  >90 mL/min   GFR calc Af Amer >90  >90 mL/min  CBC WITH DIFFERENTIAL     Status: Abnormal   Collection Time   02/03/12  5:35 AM      Component Value Range   WBC 15.3 (*) 4.0 - 10.5 K/uL   RBC 2.21 (*) 4.22 - 5.81 MIL/uL   Hemoglobin 7.0 (*) 13.0 - 17.0 g/dL   HCT 96.0 (*) 45.4 - 09.8 %   MCV 88.7  78.0 - 100.0 fL   MCH 30.8  26.0 - 34.0 pg   MCHC 34.7  30.0 - 36.0 g/dL   RDW 11.9 (*) 14.7 - 82.9 %   Platelets 472 (*) 150 - 400 K/uL   Neutrophils Relative 58  43 - 77 %   Neutro Abs 8.8 (*) 1.7 - 7.7 K/uL   Lymphocytes Relative 25  12 - 46 %   Lymphs Abs 3.8  0.7 - 4.0 K/uL   Monocytes Relative 12  3 - 12 %   Monocytes Absolute 1.9 (*) 0.1 - 1.0 K/uL   Eosinophils Relative 5  0 - 5 %   Eosinophils Absolute 0.7  0.0 - 0.7 K/uL   Basophils Relative 1  0 - 1 %   Basophils Absolute 0.1  0.0 - 0.1 K/uL    Imaging results:  No results found.   Assessment & Plan: Patient Active Hospital Problem List:  Sickle Cell Vaso-occlusive Crisis-  on adm with T. Bili 3.5 transfer 2.9   IV hydration, anti-emetic prn, anti-pruretic prn  Acute on Chronic Pain-IV  Dilaudid continue Oxycontin 80 mg BID Hemolytic Anemia of SCD- Hgb 7.O repeat in AM probable dilutional  asymptomatic  Constipation- secondary to narcotic use continue  on Miralax qd and  Senna-S BID   Leukocytosis- 18.4 admission transfer 15.3  probable  Inflammatory/reactive process remains afebrile  Secondary Hemochromatosis-  IV Desferal nightly while in hospital (never received exjade due to insurance issue CLSW  following)   HTN- stable  on Lisinopril 10mg  po daily   Hypokalemia- 3.3   continue of K-dur   Mood Disorder- stable on  Zyprexa continue  Insomnia: prn Ambein   Aundraya Dripps P 02/03/2012, 12:43 PM

## 2012-02-04 LAB — CBC WITH DIFFERENTIAL/PLATELET
Basophils Absolute: 0.2 10*3/uL — ABNORMAL HIGH (ref 0.0–0.1)
Basophils Relative: 1 % (ref 0–1)
HCT: 20.6 % — ABNORMAL LOW (ref 39.0–52.0)
Lymphocytes Relative: 23 % (ref 12–46)
Monocytes Absolute: 2.5 10*3/uL — ABNORMAL HIGH (ref 0.1–1.0)
Neutro Abs: 9.2 10*3/uL — ABNORMAL HIGH (ref 1.7–7.7)
Neutrophils Relative %: 55 % (ref 43–77)
Platelets: 480 10*3/uL — ABNORMAL HIGH (ref 150–400)
RDW: 19.9 % — ABNORMAL HIGH (ref 11.5–15.5)
WBC: 16.8 10*3/uL — ABNORMAL HIGH (ref 4.0–10.5)

## 2012-02-04 LAB — COMPREHENSIVE METABOLIC PANEL
Alkaline Phosphatase: 91 U/L (ref 39–117)
BUN: 4 mg/dL — ABNORMAL LOW (ref 6–23)
CO2: 23 mEq/L (ref 19–32)
GFR calc Af Amer: >90 mL/min (ref >90)
GFR calc non Af Amer: >90 mL/min (ref >90)
Glucose, Bld: 108 mg/dL — ABNORMAL HIGH (ref 70–99)
Potassium: 3.4 mEq/L — ABNORMAL LOW (ref 3.5–5.1)
Total Protein: 7 g/dL (ref 6.0–8.3)

## 2012-02-05 LAB — COMPREHENSIVE METABOLIC PANEL
ALT: 15 U/L (ref 0–53)
AST: 26 U/L (ref 0–37)
Albumin: 3.5 g/dL (ref 3.5–5.2)
Alkaline Phosphatase: 93 U/L (ref 39–117)
CO2: 21 mEq/L (ref 19–32)
Chloride: 106 mEq/L (ref 96–112)
GFR calc non Af Amer: >90 mL/min (ref >90)
Potassium: 3.4 mEq/L — ABNORMAL LOW (ref 3.5–5.1)
Sodium: 137 mEq/L (ref 135–145)
Total Bilirubin: 3 mg/dL — ABNORMAL HIGH (ref 0.3–1.2)

## 2012-02-05 LAB — CBC WITH DIFFERENTIAL/PLATELET
Eosinophils Absolute: 1.5 10*3/uL — ABNORMAL HIGH (ref 0.0–0.7)
Lymphs Abs: 4.6 10*3/uL — ABNORMAL HIGH (ref 0.7–4.0)
MCH: 29.8 pg (ref 26.0–34.0)
MCHC: 33.5 g/dL (ref 30.0–36.0)
MCV: 88.9 fL (ref 78.0–100.0)
Monocytes Absolute: 2.3 10*3/uL — ABNORMAL HIGH (ref 0.1–1.0)
Neutrophils Relative %: 47 % (ref 43–77)
Platelets: 436 10*3/uL — ABNORMAL HIGH (ref 150–400)

## 2012-02-06 LAB — COMPREHENSIVE METABOLIC PANEL
ALT: 22 U/L (ref 0–53)
CO2: 23 mEq/L (ref 19–32)
Calcium: 9.2 mg/dL (ref 8.4–10.5)
Creatinine, Ser: 0.55 mg/dL (ref 0.50–1.35)
GFR calc Af Amer: >90 mL/min (ref >90)
GFR calc non Af Amer: >90 mL/min (ref >90)
Glucose, Bld: 109 mg/dL — ABNORMAL HIGH (ref 70–99)
Sodium: 137 mEq/L (ref 135–145)
Total Protein: 7.6 g/dL (ref 6.0–8.3)

## 2012-02-06 LAB — CBC WITH DIFFERENTIAL/PLATELET
Basophils Relative: 1 % (ref 0–1)
Eosinophils Relative: 9 % — ABNORMAL HIGH (ref 0–5)
HCT: 21.6 % — ABNORMAL LOW (ref 39.0–52.0)
Hemoglobin: 7.4 g/dL — ABNORMAL LOW (ref 13.0–17.0)
Lymphocytes Relative: 23 % (ref 12–46)
MCHC: 34.3 g/dL (ref 30.0–36.0)
Monocytes Relative: 17 % — ABNORMAL HIGH (ref 3–12)
Neutro Abs: 8 10*3/uL — ABNORMAL HIGH (ref 1.7–7.7)
Neutrophils Relative %: 50 % (ref 43–77)
RBC: 2.43 MIL/uL — ABNORMAL LOW (ref 4.22–5.81)

## 2012-02-06 MED ORDER — SODIUM CHLORIDE 0.9 % IJ SOLN: 9.0000 mL | INTRAMUSCULAR | Status: DC | PRN

## 2012-02-06 MED ORDER — HYDROMORPHONE 0.3 MG/ML IV SOLN
INTRAVENOUS | Status: DC
  Administered 2012-02-06: 3.77 mg via INTRAVENOUS
  Administered 2012-02-06: 3.12 mg via INTRAVENOUS
  Administered 2012-02-06 (×2): via INTRAVENOUS
  Administered 2012-02-07: 5.65 mg via INTRAVENOUS
  Administered 2012-02-07 (×2): via INTRAVENOUS
  Administered 2012-02-07: 7.99 mg via INTRAVENOUS
  Administered 2012-02-07: 13:00:00 via INTRAVENOUS
  Administered 2012-02-07: 6.85 mg via INTRAVENOUS
  Administered 2012-02-07: 3.99 mg via INTRAVENOUS
  Administered 2012-02-07: 03:00:00 via INTRAVENOUS
  Administered 2012-02-07: 9.69 mg via INTRAVENOUS
  Filled 2012-02-06 (×7): qty 25

## 2012-02-06 MED ORDER — FOLIC ACID 1 MG PO TABS
1.0000 mg | ORAL_TABLET | Freq: Every day | ORAL | Status: DC
  Administered 2012-02-06 – 2012-02-08 (×3): 1 mg via ORAL
  Filled 2012-02-06 (×3): qty 1

## 2012-02-06 MED ORDER — KETOROLAC TROMETHAMINE 30 MG/ML IJ SOLN: 30.0000 mg | Freq: Four times a day (QID) | INTRAMUSCULAR | Status: DC | PRN

## 2012-02-06 MED ORDER — NALOXONE HCL 0.4 MG/ML IJ SOLN: 0.4000 mg | INTRAMUSCULAR | Status: DC | PRN

## 2012-02-06 MED ORDER — DIPHENHYDRAMINE HCL 50 MG PO CAPS
50.0000 mg | ORAL_CAPSULE | ORAL | Status: DC | PRN
  Administered 2012-02-06 – 2012-02-08 (×9): 50 mg via ORAL
  Filled 2012-02-06 (×10): qty 1

## 2012-02-06 MED ORDER — HYDROXYUREA 500 MG PO CAPS
500.0000 mg | ORAL_CAPSULE | Freq: Two times a day (BID) | ORAL | Status: DC
  Administered 2012-02-06 – 2012-02-08 (×5): 500 mg via ORAL
  Filled 2012-02-06 (×6): qty 1

## 2012-02-06 NOTE — Progress Notes (Signed)
SICKLE CELL PROGRESS NOTE  [name] QMV:[id number] DOB: [birth year] DOA: 02/02/2012 PCP: [name] [name], MD  Assessment/Plan: Principal Problem:  Sickle cell anemia with crisis: Continue MS Contin. Will change patient to PCA as his pain relief only lasting 1 hour with bolus regimen. Pt still does not seem to understand the indication for admission and continued hospitalization. Continue folic acid and hydrea.    Leukocytosis: Likely secondary to VOC. Discontinue ibuprofen and add IV Toradol.   Mood disorder: Continue Olanzipine  Code Status: Full Family Communication: N/A Disposition Plan: Home in 2-3 days  [name] A.  [facility] Pager [phone]. If 8PM-8AM, please contact night-coverage at [email or web address], password [facility] 02/06/2012, 1:03 PM  LOS: 4 days   Brief narrative: [name] is a 32 year old African American male with SS genotype Sickle cell disease a PMH of Acute embolism thrombosis, hypokalemia, mood disorder, PE, avascular necrosis, hypertension. [facility] with sharp stabbing throbbing rib and stomach pain. Rated pain as 9/10. At the beginning of the week was treated at the [facility] for 23 hr observation felt was able to manage pain at home tried but pain only continue to Esculate. Admitted for 23 hour observation on yesterday. Baseline 3/10 pain His pain is still unmanageable at home reluctantly will be admitted for further treatment and evaluation.    Consultants:  None  Procedures:  None  Antibiotics:  None  HPI/Subjective: Pt rates pain at 8/10 which decreases to 4-5/10 but relief lasts only 1 hour. Pain is localized to legs and arms. Pt did have pain in chest but states that chest pain is resolved.  Objective: Filed Vitals:   02/05/12 1824 02/05/12 2047 02/06/12 0614 02/06/12 1001  BP: 115/75 115/74 121/75 118/70  Pulse: 82 82 97 83  Temp: 98.8 F (37.1 C) 98.8 F (37.1 C) 98.2 F (36.8 C) 98.5 F (36.9 C)  TempSrc: Oral Oral Oral Oral    Resp: 18 20 20 18   Height:      Weight:      SpO2: 100% 100% 100% 100%   Weight change:   Intake/Output Summary (Last 24 hours) at 02/06/12 1303 Last data filed at 02/06/12 0552  Gross per 24 hour  Intake 2294.17 ml  Output   1000 ml  Net 1294.17 ml    General: Alert, awake, oriented x3, in no acute distress.  HEENT: [place]/AT PEERL, EOMI, Anicteric Neck: Trachea midline,  no masses, no thyromegal,y no JVD, no carotid bruit OROPHARYNX:  Moist, No exudate/ erythema/lesions.  Heart: Regular rate and rhythm, without murmurs, rubs, gallops.  Lungs: Clear to auscultation, no wheezing or rhonchi noted. Abdomen: Soft, nontender, nondistended, positive bowel sounds, no masses no hepatosplenomegaly noted.  Neuro: No focal neurological deficits noted cranial nerves II through XII grossly intact.  Musculoskeletal: No warm swelling or erythema around joints, no spinal tenderness noted. Psychiatric: Patient alert and oriented x3, good insight and cognition, good recent to remote recall.    Data Reviewed: Basic Metabolic Panel:  Lab 02/06/12 4010 02/05/12 0500 02/04/12 0610 02/03/12 0535 02/02/12 1145  NA 137 137 136 137 135  K 3.8 3.4* 3.4* 3.3* 3.5  CL 104 106 106 105 102  CO2 23 21 23 25 23   GLUCOSE 109* 113* 108* 122* 107*  BUN 4* 4* 4* 7 6  CREATININE 0.55 0.64 0.62 0.64 0.55  CALCIUM 9.2 8.6 8.6 8.5 9.3  MG -- -- -- -- --  PHOS -- -- -- -- --   Liver Function Tests:  Lab 02/06/12  2952 02/05/12 0500 02/04/12 0610 02/03/12 0535 02/02/12 1145  AST 32 26 21 21 28   ALT 22 15 14 15 19   ALKPHOS 98 93 91 84 95  BILITOT 3.4* 3.0* 2.6* 2.9* 3.5*  PROT 7.6 6.9 7.0 6.9 8.3  ALBUMIN 3.7 3.5 3.6 3.5 4.2   No results found for this basename: LIPASE:5,AMYLASE:5 in the last 168 hours No results found for this basename: AMMONIA:5 in the last 168 hours CBC:  Lab 02/06/12 0525 02/05/12 0500 02/04/12 0610 02/03/12 0535 02/02/12 1145  WBC 16.2* 16.5* 16.8* 15.3* 18.4*  NEUTROABS 8.0* 7.8*  9.2* 8.8* 14.1*  HGB 7.4* 6.8* 7.1* 7.0* 7.6*  HCT 21.6* 20.0* 20.6* 19.6* 22.2*  MCV 88.9 88.9 89.2 88.7 87.7  PLT 477* 436* 480* 472* 630*   Cardiac Enzymes: No results found for this basename: CKTOTAL:5,CKMB:5,CKMBINDEX:5,TROPONINI:5 in the last 168 hours BNP (last 3 results)  Basename 07/18/11 1500 07/05/11 0620 06/07/11 0650  PROBNP 13.5 219.1* 7.4   CBG: No results found for this basename: GLUCAP:5 in the last 168 hours  No results found for this or any previous visit (from the past 240 hour(s)).   Studies: Dg Chest 2 View  01/14/2012  *RADIOLOGY REPORT*  Clinical Data: Left chest pain, sickle cell crisis  CHEST - 2 VIEW  Comparison: 01/05/2012  Findings: Increased interstitial markings/bronchitic changes.  No focal consolidation. No pleural effusion or pneumothorax.  Stable mild cardiomegaly.  Left chest power port.  Visualized osseous structures are within normal limits.  Cholecystectomy clips.  IMPRESSION: No evidence of acute cardiopulmonary disease.  Increased interstitial markings/bronchitic changes, stable.   Original Report Authenticated By: Charline Bills, M.D.    Dg [name] W/small Bowel  01/16/2012  *RADIOLOGY REPORT*  Clinical Data:Intermittent nausea and vomiting.  Sickle cell.  UPPER GI W/ SMALL BOWEL  Technique: Upper GI series performed with high density barium and effervescent agent. Thin barium also used.  Subsequently, serial images of the small bowel were obtained including spot views of the terminal ileum.  Fluoroscopy Time: 2.0 minutes  Comparison: None.  Findings: Scout view of the abdomen shows stool throughout the colon.  Surgical clips in the right upper quadrant.  No unexpected radiopaque calculi.  Right hip arthroplasty.  Double contrast examination of the upper gastrointestinal tract shows normal esophageal motility.  No esophageal fold thickening, stricture or obstruction.  Stomach and duodenal bulb are normal.  Small bowel transit time is approximately 5  hours and 15 minutes. Small bowel fold pattern is normal.  Terminal ileum is unremarkable.  IMPRESSION: Severe constipation.   Original Report Authenticated By: Leanna Battles, M.D.     Scheduled Meds:   . deferoxamine (DESFERAL) IV  2,000 mg Intravenous QHS  . enoxaparin (LOVENOX) injection  40 mg Subcutaneous Q24H  . lisinopril  10 mg Oral Q2200  . OLANZapine  10 mg Oral QHS  . OxyCODONE  80 mg Oral Q12H  . polyethylene glycol  17 g Oral QHS  . potassium chloride SA  20 mEq Oral BID  . senna-docusate  1 tablet Oral BID   Continuous Infusions:   . dextrose 5 % and 0.45% NaCl 125 mL/hr at 02/05/12 2119    Principal Problem:  *Sickle cell anemia with crisis Active Problems:  Leukocytosis  Mood disorder

## 2012-02-07 LAB — CBC WITH DIFFERENTIAL/PLATELET
Basophils Relative: 1 % (ref 0–1)
Eosinophils Absolute: 1.1 10*3/uL — ABNORMAL HIGH (ref 0.0–0.7)
HCT: 21.8 % — ABNORMAL LOW (ref 39.0–52.0)
Hemoglobin: 7.5 g/dL — ABNORMAL LOW (ref 13.0–17.0)
MCH: 30.6 pg (ref 26.0–34.0)
MCHC: 34.4 g/dL (ref 30.0–36.0)
Monocytes Absolute: 2.5 10*3/uL — ABNORMAL HIGH (ref 0.1–1.0)
Monocytes Relative: 16 % — ABNORMAL HIGH (ref 3–12)

## 2012-02-07 MED ORDER — HYDROMORPHONE HCL 4 MG PO TABS
8.0000 mg | ORAL_TABLET | ORAL | Status: DC
  Administered 2012-02-07 – 2012-02-08 (×6): 8 mg via ORAL
  Filled 2012-02-07 (×6): qty 2

## 2012-02-07 MED ORDER — HYDROMORPHONE 0.3 MG/ML IV SOLN
INTRAVENOUS | Status: DC
  Administered 2012-02-07: 18:00:00 via INTRAVENOUS
  Administered 2012-02-07: 4.99 mg via INTRAVENOUS
  Administered 2012-02-08: 12:00:00 via INTRAVENOUS
  Administered 2012-02-08: 0.6 mg via INTRAVENOUS
  Administered 2012-02-08: 01:00:00 via INTRAVENOUS
  Administered 2012-02-08: 4.79 mg via INTRAVENOUS
  Administered 2012-02-08: 3.99 mg via INTRAVENOUS
  Administered 2012-02-08: 0.599 mg via INTRAVENOUS
  Filled 2012-02-07 (×2): qty 25

## 2012-02-07 NOTE — Progress Notes (Signed)
SICKLE CELL PROGRESS NOTE  [name] NWG:[id number] DOB: 1980-10-26 DOA: 02/02/2012 PCP: Gerald Saucer [name], MD  Assessment/Plan: Principal Problem:  Sickle cell anemia with crisis: Continue MS Contin. Pt states pain down to 5/10 with PCA.  Will decrease PCA and add oral short acting medications. Pt still does not seem to understand the indication for admission and continued hospitalization. Continue folic acid and hydrea. Plan to discharge home tomorrow.   Leukocytosis: No evidence of infection,  likely secondary to VOC. Discontinue ibuprofen and add IV Toradol.   Mood disorder: Continue Olanzipine  Code Status: Full Family Communication: N/A Disposition Plan: Plan for D/C home tomorrorw.  [name] A.  [facility] Pager [phone]. If 7PM-7AM, please contact night-coverage. 02/07/2012, 5:11 PM  LOS: 5 days   Brief narrative: [name] is a 32 year old African American male with SS genotype Sickle cell disease a PMH of Acute embolism thrombosis, hypokalemia, mood disorder, PE, avascular necrosis, hypertension. [facility] with sharp stabbing throbbing rib and stomach pain. Rated pain as 9/10. At the beginning of the week was treated at the [facility] for 23 hr observation felt was able to manage pain at home tried but pain only continue to Esculate. Admitted for 23 hour observation on yesterday. Baseline 3/10 pain His pain is still unmanageable at home reluctantly will be admitted for further treatment and evaluation.    Consultants:  None  Procedures:  None  Antibiotics:  None  HPI/Subjective: Pt rates pain at 5/10 with PCA. Pain is localized to legs and arms. Chest pain resolved. Pt very angry that the delivery of his narcotics has been changed from bolus to PCA and that his benadryl has been changed to oral route despite the fact that patient is tolerating oral intake well.  Objective: Filed Vitals:   02/07/12 0415 02/07/12 0715 02/07/12 1027 02/07/12 1423  BP:   102/63 104/66 110/68  Pulse:  72 79 90  Temp:  98.3 F (36.8 C) 98.7 F (37.1 C) 98.3 F (36.8 C)  TempSrc:  Oral    Resp: 20 20 18 20   Height:      Weight:  78.6 kg (173 lb 4.5 oz)    SpO2: 100% 100% 100% 99%   Weight change:   Intake/Output Summary (Last 24 hours) at 02/07/12 1711 Last data filed at 02/07/12 1300  Gross per 24 hour  Intake    673 ml  Output    750 ml  Net    -77 ml    General: Alert, awake, oriented x3, in no acute distress.  HEENT: [place]/AT PEERL, EOMI, Anicteric Heart: Regular rate and rhythm, without murmurs, rubs, gallops.  Lungs: Clear to auscultation, no wheezing or rhonchi noted. Abdomen: Soft, nontender, nondistended, positive bowel sounds, no masses no hepatosplenomegaly noted.  Neuro: No focal neurological deficits noted cranial nerves II through XII grossly intact.  Musculoskeletal: No warm swelling or erythema around joints, no spinal tenderness noted. Psychiatric: Patient has a very angry affect.    Data Reviewed: Basic Metabolic Panel:  Lab 02/06/12 2952 02/05/12 0500 02/04/12 0610 02/03/12 0535 02/02/12 1145  NA 137 137 136 137 135  K 3.8 3.4* 3.4* 3.3* 3.5  CL 104 106 106 105 102  CO2 23 21 23 25 23   GLUCOSE 109* 113* 108* 122* 107*  BUN 4* 4* 4* 7 6  CREATININE 0.55 0.64 0.62 0.64 0.55  CALCIUM 9.2 8.6 8.6 8.5 9.3  MG -- -- -- -- --  PHOS -- -- -- -- --  Liver Function Tests:  Lab 02/06/12 0525 02/05/12 0500 02/04/12 0610 02/03/12 0535 02/02/12 1145  AST 32 26 21 21 28   ALT 22 15 14 15 19   ALKPHOS 98 93 91 84 95  BILITOT 3.4* 3.0* 2.6* 2.9* 3.5*  PROT 7.6 6.9 7.0 6.9 8.3  ALBUMIN 3.7 3.5 3.6 3.5 4.2   No results found for this basename: LIPASE:5,AMYLASE:5 in the last 168 hours No results found for this basename: AMMONIA:5 in the last 168 hours CBC:  Lab 02/07/12 0530 02/06/12 0525 02/05/12 0500 02/04/12 0610 02/03/12 0535  WBC 15.8* 16.2* 16.5* 16.8* 15.3*  NEUTROABS 9.1* 8.0* 7.8* 9.2* 8.8*  HGB 7.5* 7.4* 6.8* 7.1*  7.0*  HCT 21.8* 21.6* 20.0* 20.6* 19.6*  MCV 89.0 88.9 88.9 89.2 88.7  PLT 483* 477* 436* 480* 472*   Cardiac Enzymes: No results found for this basename: CKTOTAL:5,CKMB:5,CKMBINDEX:5,TROPONINI:5 in the last 168 hours BNP (last 3 results)  Basename 07/18/11 1500 07/05/11 0620 06/07/11 0650  PROBNP 13.5 219.1* 7.4   CBG: No results found for this basename: GLUCAP:5 in the last 168 hours  No results found for this or any previous visit (from the past 240 hour(s)).   Studies: Dg Chest 2 View  01/14/2012  *RADIOLOGY REPORT*  Clinical Data: Left chest pain, sickle cell crisis  CHEST - 2 VIEW  Comparison: 01/05/2012  Findings: Increased interstitial markings/bronchitic changes.  No focal consolidation. No pleural effusion or pneumothorax.  Stable mild cardiomegaly.  Left chest power port.  Visualized osseous structures are within normal limits.  Cholecystectomy clips.  IMPRESSION: No evidence of acute cardiopulmonary disease.  Increased interstitial markings/bronchitic changes, stable.   Original Report Authenticated By: [name], M.D.    Dg [name] W/small Bowel  01/16/2012  *RADIOLOGY REPORT*  Clinical Data:Intermittent nausea and vomiting.  Sickle cell.  UPPER GI W/ SMALL BOWEL  Technique: Upper GI series performed with high density barium and effervescent agent. Thin barium also used.  Subsequently, serial images of the small bowel were obtained including spot views of the terminal ileum.  Fluoroscopy Time: 2.0 minutes  Comparison: None.  Findings: Scout view of the abdomen shows stool throughout the colon.  Surgical clips in the right upper quadrant.  No unexpected radiopaque calculi.  Right hip arthroplasty.  Double contrast examination of the upper gastrointestinal tract shows normal esophageal motility.  No esophageal fold thickening, stricture or obstruction.  Stomach and duodenal bulb are normal.  Small bowel transit time is approximately 5 hours and 15 minutes. Small bowel fold  pattern is normal.  Terminal ileum is unremarkable.  IMPRESSION: Severe constipation.   Original Report Authenticated By: [name], M.D.     Scheduled Meds:    . deferoxamine (DESFERAL) IV  2,000 mg Intravenous QHS  . enoxaparin (LOVENOX) injection  40 mg Subcutaneous Q24H  . folic acid  1 mg Oral Daily  . HYDROmorphone PCA 0.3 mg/mL   Intravenous Q4H  . hydroxyurea  500 mg Oral BID  . lisinopril  10 mg Oral Q2200  . OLANZapine  10 mg Oral QHS  . OxyCODONE  80 mg Oral Q12H  . polyethylene glycol  17 g Oral QHS  . potassium chloride SA  20 mEq Oral BID  . senna-docusate  1 tablet Oral BID   Continuous Infusions:    . dextrose 5 % and 0.45% NaCl 20 mL/hr at 02/06/12 1348    Principal Problem:  *Sickle cell anemia with crisis Active Problems:  Leukocytosis  Mood disorder

## 2012-02-08 ENCOUNTER — Encounter (HOSPITAL_COMMUNITY): Payer: Self-pay | Admitting: Hematology

## 2012-02-08 ENCOUNTER — Non-Acute Institutional Stay (HOSPITAL_COMMUNITY)
Admission: AD | Admit: 2012-02-08 | Discharge: 2012-02-09 | Disposition: A | Discharge status: Home / Self Care | Payer: Medicare Other | Source: Ambulatory Visit | Attending: Internal Medicine | Admitting: Internal Medicine

## 2012-02-08 DIAGNOSIS — I10 Essential (primary) hypertension: Secondary | ICD-10-CM | POA: Insufficient documentation

## 2012-02-08 DIAGNOSIS — R52 Pain, unspecified: Secondary | ICD-10-CM | POA: Insufficient documentation

## 2012-02-08 DIAGNOSIS — F39 Unspecified mood [affective] disorder: Secondary | ICD-10-CM | POA: Insufficient documentation

## 2012-02-08 DIAGNOSIS — Z79899 Other long term (current) drug therapy: Secondary | ICD-10-CM | POA: Insufficient documentation

## 2012-02-08 DIAGNOSIS — K5900 Constipation, unspecified: Secondary | ICD-10-CM | POA: Insufficient documentation

## 2012-02-08 DIAGNOSIS — D5700 Hb-SS disease with crisis, unspecified: Secondary | ICD-10-CM | POA: Insufficient documentation

## 2012-02-08 DIAGNOSIS — R079 Chest pain, unspecified: Secondary | ICD-10-CM | POA: Diagnosis not present

## 2012-02-08 DIAGNOSIS — D72829 Elevated white blood cell count, unspecified: Secondary | ICD-10-CM | POA: Insufficient documentation

## 2012-02-08 DIAGNOSIS — G8929 Other chronic pain: Secondary | ICD-10-CM | POA: Insufficient documentation

## 2012-02-08 LAB — COMPREHENSIVE METABOLIC PANEL
Albumin: 3.8 g/dL (ref 3.5–5.2)
Alkaline Phosphatase: 105 U/L (ref 39–117)
BUN: 12 mg/dL (ref 6–23)
Potassium: 4.3 mEq/L (ref 3.5–5.1)
Sodium: 137 mEq/L (ref 135–145)
Total Protein: 7.6 g/dL (ref 6.0–8.3)

## 2012-02-08 LAB — CBC WITH DIFFERENTIAL/PLATELET
Basophils Relative: 2 % — ABNORMAL HIGH (ref 0–1)
Eosinophils Absolute: 0.9 10*3/uL — ABNORMAL HIGH (ref 0.0–0.7)
MCH: 30 pg (ref 26.0–34.0)
MCHC: 34.1 g/dL (ref 30.0–36.0)
Monocytes Relative: 19 % — ABNORMAL HIGH (ref 3–12)
Neutrophils Relative %: 49 % (ref 43–77)
Platelets: 474 10*3/uL — ABNORMAL HIGH (ref 150–400)
RDW: 19.1 % — ABNORMAL HIGH (ref 11.5–15.5)

## 2012-02-08 MED ORDER — IBUPROFEN 800 MG PO TABS: 800.0000 mg | ORAL_TABLET | Freq: Three times a day (TID) | ORAL | Status: DC | PRN

## 2012-02-08 MED ORDER — POLYETHYLENE GLYCOL 3350 17 G PO PACK
17.0000 g | PACK | Freq: Every day | ORAL | Status: DC
  Filled 2012-02-08 (×2): qty 1

## 2012-02-08 MED ORDER — HYDROMORPHONE HCL PF 2 MG/ML IJ SOLN
1.0000 mg | INTRAMUSCULAR | Status: DC | PRN
  Administered 2012-02-08: 2 mg via INTRAVENOUS
  Filled 2012-02-08: qty 1

## 2012-02-08 MED ORDER — DEXTROSE-NACL 5-0.45 % IV SOLN
INTRAVENOUS | Status: DC
  Administered 2012-02-08 – 2012-02-09 (×2): via INTRAVENOUS

## 2012-02-08 MED ORDER — DIPHENHYDRAMINE HCL 25 MG PO CAPS
25.0000 mg | ORAL_CAPSULE | Freq: Four times a day (QID) | ORAL | Status: DC | PRN
  Administered 2012-02-08: 25 mg via ORAL
  Filled 2012-02-08: qty 1

## 2012-02-08 MED ORDER — HYDROMORPHONE HCL 4 MG PO TABS: 4.0000 mg | ORAL_TABLET | ORAL | Status: DC | PRN

## 2012-02-08 MED ORDER — HYDROXYUREA 500 MG PO CAPS
500.0000 mg | ORAL_CAPSULE | Freq: Two times a day (BID) | ORAL | Status: DC
  Administered 2012-02-08 – 2012-02-09 (×2): 500 mg via ORAL
  Filled 2012-02-08 (×2): qty 1

## 2012-02-08 MED ORDER — HYDROMORPHONE HCL PF 2 MG/ML IJ SOLN
4.0000 mg | Freq: Once | INTRAMUSCULAR | Status: AC
  Administered 2012-02-08: 4 mg via INTRAVENOUS
  Filled 2012-02-08: qty 2

## 2012-02-08 MED ORDER — HYDROMORPHONE HCL 4 MG PO TABS
4.0000 mg | ORAL_TABLET | ORAL | Status: DC | PRN
  Administered 2012-02-08 – 2012-02-09 (×3): 4 mg via ORAL
  Filled 2012-02-08 (×3): qty 1

## 2012-02-08 MED ORDER — SENNOSIDES-DOCUSATE SODIUM 8.6-50 MG PO TABS
1.0000 | ORAL_TABLET | Freq: Two times a day (BID) | ORAL | Status: DC
  Administered 2012-02-08 – 2012-02-09 (×2): 1 via ORAL
  Filled 2012-02-08 (×2): qty 1

## 2012-02-08 MED ORDER — LISINOPRIL 10 MG PO TABS
10.0000 mg | ORAL_TABLET | Freq: Every day | ORAL | Status: DC
  Administered 2012-02-08: 10 mg via ORAL
  Filled 2012-02-08 (×2): qty 1

## 2012-02-08 MED ORDER — ONDANSETRON HCL 4 MG PO TABS
4.0000 mg | ORAL_TABLET | Freq: Three times a day (TID) | ORAL | Status: DC | PRN
  Administered 2012-02-08: 4 mg via ORAL
  Filled 2012-02-08: qty 1

## 2012-02-08 MED ORDER — ZOLPIDEM TARTRATE 5 MG PO TABS
10.0000 mg | ORAL_TABLET | Freq: Every evening | ORAL | Status: DC | PRN
  Filled 2012-02-08: qty 2

## 2012-02-08 MED ORDER — OXYCODONE HCL ER 80 MG PO T12A
80.0000 mg | EXTENDED_RELEASE_TABLET | Freq: Two times a day (BID) | ORAL | Status: DC
  Administered 2012-02-08 – 2012-02-09 (×2): 80 mg via ORAL
  Filled 2012-02-08 (×3): qty 1

## 2012-02-08 MED ORDER — DEXTROSE 5 % IV SOLN
2000.0000 mg | Freq: Every day | INTRAVENOUS | Status: DC
  Administered 2012-02-08: 2000 mg via INTRAVENOUS
  Filled 2012-02-08 (×2): qty 2

## 2012-02-08 MED ORDER — OLANZAPINE 10 MG PO TABS
10.0000 mg | ORAL_TABLET | Freq: Every day | ORAL | Status: DC
  Administered 2012-02-08: 10 mg via ORAL
  Filled 2012-02-08 (×2): qty 1

## 2012-02-08 MED ORDER — POTASSIUM CHLORIDE CRYS ER 20 MEQ PO TBCR
20.0000 meq | EXTENDED_RELEASE_TABLET | Freq: Two times a day (BID) | ORAL | Status: DC
  Administered 2012-02-08 – 2012-02-09 (×2): 20 meq via ORAL
  Filled 2012-02-08 (×2): qty 1

## 2012-02-08 MED ORDER — OXYCODONE HCL ER 80 MG PO T12A: 80.0000 mg | EXTENDED_RELEASE_TABLET | Freq: Two times a day (BID) | ORAL | Status: DC

## 2012-02-08 NOTE — Discharge Summary (Signed)
[name] MRN: [id number] DOB/AGE: Mar 13, 1980 31 y.o.  Admit date: 02/02/2012 Discharge date: 02/08/2012  Primary Care Physician:  Willey Blade, MD   Discharge Diagnoses:   Patient Active Problem List  Diagnosis  . Sickle cell pain crisis  . Leukocytosis  . Chronic pain  . Hypokalemia  . Metabolic acidosis  . Thrombocytosis  . Hemochromatosis  . Hypomagnesemia  . Acute chest syndrome due to sickle cell crisis  . Diarrhea  . Elevated brain natriuretic peptide (BNP) level  . Bronchitis  . Sickle cell anemia  . Acute embolism and thrombosis of right internal jugular vein  . Avascular necrosis  . Metabolic encephalopathy  . Increased ammonia level  . Hemoglobin S-S disease  . Sickle cell anemia with crisis  . Secondary hemochromatosis  . Sickle cell hemolytic anemia  . Elevated bilirubin  . Emesis  . Constipation  . Mood disorder  . Hypertension    DISCHARGE MEDICATION:   Medication List     As of 02/08/2012  1:26 PM    TAKE these medications         folic acid 1 MG tablet   Commonly known as: FOLVITE   Take 1 tablet (1 mg total) by mouth daily.      HYDROmorphone 4 MG tablet   Commonly known as: DILAUDID   Take 1-2 tablets (4-8 mg total) by mouth every 4 (four) hours as needed for pain. For pain.      hydroxyurea 500 MG capsule   Commonly known as: HYDREA   Take 1 capsule (500 mg total) by mouth 2 (two) times daily. May take with food to minimize GI side effects.      ibuprofen 800 MG tablet   Commonly known as: ADVIL,MOTRIN   Take 1 tablet (800 mg total) by mouth every 8 (eight) hours as needed for pain. Do not exceed use for more than 20/30 days      lisinopril 10 MG tablet   Commonly known as: PRINIVIL,ZESTRIL   Take 1 tablet (10 mg total) by mouth daily at 10 pm.      OLANZapine 10 MG tablet   Commonly known as: ZYPREXA   Take 10 mg by mouth at bedtime.      oxyCODONE 80 MG 12 hr tablet   Commonly known as: OXYCONTIN   Take 1 tablet (80 mg  total) by mouth every 12 (twelve) hours.      polyethylene glycol packet   Commonly known as: MIRALAX / GLYCOLAX   Take 17 g by mouth at bedtime.      potassium chloride SA 20 MEQ tablet   Commonly known as: K-DUR,KLOR-CON   Take 1 tablet (20 mEq total) by mouth 2 (two) times daily.      senna-docusate 8.6-50 MG per tablet   Commonly known as: Senokot-S   Take 1 tablet by mouth 2 (two) times daily.      zolpidem 10 MG tablet   Commonly known as: AMBIEN   Take 1 tablet (10 mg total) by mouth at bedtime as needed. For sleep          Consults:     SIGNIFICANT DIAGNOSTIC STUDIES:  Dg Chest 2 View  01/14/2012  *RADIOLOGY REPORT*  Clinical Data: Left chest pain, sickle cell crisis  CHEST - 2 VIEW  Comparison: 01/05/2012  Findings: Increased interstitial markings/bronchitic changes.  No focal consolidation. No pleural effusion or pneumothorax.  Stable mild cardiomegaly.  Left chest power port.  Visualized osseous structures are within  normal limits.  Cholecystectomy clips.  IMPRESSION: No evidence of acute cardiopulmonary disease.  Increased interstitial markings/bronchitic changes, stable.   Original Report Authenticated By: Charline Bills, M.D.    Dg [name] W/small Bowel  01/16/2012  *RADIOLOGY REPORT*  Clinical Data:Intermittent nausea and vomiting.  Sickle cell.  UPPER GI W/ SMALL BOWEL  Technique: Upper GI series performed with high density barium and effervescent agent. Thin barium also used.  Subsequently, serial images of the small bowel were obtained including spot views of the terminal ileum.  Fluoroscopy Time: 2.0 minutes  Comparison: None.  Findings: Scout view of the abdomen shows stool throughout the colon.  Surgical clips in the right upper quadrant.  No unexpected radiopaque calculi.  Right hip arthroplasty.  Double contrast examination of the upper gastrointestinal tract shows normal esophageal motility.  No esophageal fold thickening, stricture or obstruction.  Stomach and  duodenal bulb are normal.  Small bowel transit time is approximately 5 hours and 15 minutes. Small bowel fold pattern is normal.  Terminal ileum is unremarkable.  IMPRESSION: Severe constipation.   Original Report Authenticated By: Leanna Battles, M.D.     BRIEF ADMITTING H & P: [name] is a 32 year old African American male with SS genotype Sickle cell disease a PMH of Acute embolism thrombosis, hypokalemia, mood disorder, PE, avascular necrosis, hypertension. [facility] with sharp stabbing throbbing rib and stomach pain. Rated pain as 9/10. At the beginning of the week was treated at the [facility] for 23 hr observation felt was able to manage pain at home tried but pain only continue to Esculate. Admitted for 23 hour observation on yesterday. Baseline 3/10 pain His pain is still unmanageable at home reluctantly will be admitted for further treatment and evaluation.   Hospital Course:  Present on Admission:  . Sickle cell anemia with crisis: Pt was admitted with acute VOC. He was treated with bolus doses of Dilaudid however this regimen did not treat patient for the entire interval. He was then transitioned to PCA which was then tapered with the addition of oral medications. Pt's pain is tolerable at present and states that he was able to sleep last night. He is still requiring some IV pain medication for breakthrough. Patient is being discharged to [facility] for step down care and expected to be discharged to home within 24 hours. Pt has large information deficit on the indications for hospitalization and what is expected in his care of his disease on a chronic basis. Pt verbalized that his only recourse when he has any level of pain that he deems intolerable is hospitalization. I have attempted educate patient about his options other than hospitalization however he is not receptive. He is very resistant to change and to even understanding the nuances of his disease and it's management on a  chronic basis.  . Mood disorder: Pt is on Olanzapine. However he appears to have some degree of mood instability. I offered as in previous admissions Psychiatric consultation to assess medications and adjust as necessary but patient  has refused. Please note that patient is alert and oriented x 3 and has the capacity for consenting for consultation.  . Leukocytosis: Felt to be associated with inflammatory component of pain. Pt has no signs of infection. WBC count is trending down.   Disposition and Follow-up:  Pt is discharged to [facility] for step down care with discharge home anticipated tomorrow.      Discharge Orders    Future Orders  Please Complete By Expires   Diet - low sodium heart healthy      Activity as tolerated - No restrictions         DISCHARGE EXAM:  General: Alert, awake, oriented x3, in no acute distress. When asked about his pain, patient states that he's getting there and that his pain is 5/10 but he feels unable to manage it at home.  Vital Signs: BP 110/66, pulse 87, T 99.2 F (37.3 C), temperature source Oral, RR 15, height 6' (1.829 m), weight 78.3 kg (172 lb 9.9 oz), SpO2 100.00%. HEENT: [place]/AT PEERL, EOMI, Anicteric  Heart: Regular rate and rhythm, without murmurs, rubs, gallops.  Lungs: Clear to auscultation, no wheezing or rhonchi noted.  Abdomen: Soft, nontender, nondistended, positive bowel sounds, no masses no hepatosplenomegaly noted.  Neuro: No focal neurological deficits noted cranial nerves II through XII grossly intact.  Musculoskeletal: No warm swelling or erythema around joints, no spinal tenderness noted.  Psychiatric: Calm affect.    Basename 02/06/12 0525  NA 137  K 3.8  CL 104  CO2 23  GLUCOSE 109*  BUN 4*  CREATININE 0.55  CALCIUM 9.2  MG --  PHOS --    Basename 02/06/12 0525  AST 32  ALT 22  ALKPHOS 98  BILITOT 3.4*  PROT 7.6  ALBUMIN 3.7   No results found for this basename: LIPASE:2,AMYLASE:2 in the last  72 hours  Basename 02/08/12 0930 02/07/12 0530  WBC 12.6* 15.8*  NEUTROABS 6.2 9.1*  HGB 7.3* 7.5*  HCT 21.4* 21.8*  MCV 88.1 89.0  PLT 474* 483*   Total time for discharge including face to face time and decision making greater than thirty minutes. Signed: Kejuan Bekker A. 02/08/2012, 1:26 PM

## 2012-02-08 NOTE — H&P (Signed)
[facility] History and Physical   Date: 02/08/2012  Patient name: [name] Medical record number: [id number] Date of birth: 1981-01-09 Age: 32 y.o. Gender: male PCP: [name], [name], MD  Attending physician: Gwenyth Bender, MD  Chief Complaint: right/left sided ribs   History of Present Illness:  [name] is a 32 year old African American male with SS genotype Sickle cell disease a PMH of  Acute embolism thrombosis, hypokalemia, mood disorder, PE, avascular necrosis, hypertension.  He is being transitioned from inpatient service to [facility] in preparation for a 23 hr observation and discharge for better pain management/control. Rated pain as 7/10 bilateral side pain described as constant throbbing continues with little to no relief.  Baseline 3/10 pain His pain is still unmanageable at home   Meds: Prescriptions prior to admission  Medication Sig Dispense Refill  . folic acid (FOLVITE) 1 MG tablet Take 1 tablet (1 mg total) by mouth daily.  100 tablet  2  . HYDROmorphone (DILAUDID) 4 MG tablet Take 1-2 tablets (4-8 mg total) by mouth every 4 (four) hours as needed for pain. For pain.    0  . hydroxyurea (HYDREA) 500 MG capsule Take 1 capsule (500 mg total) by mouth 2 (two) times daily. May take with food to minimize GI side effects.  60 capsule  2  . ibuprofen (ADVIL,MOTRIN) 800 MG tablet Take 1 tablet (800 mg total) by mouth every 8 (eight) hours as needed for pain. Do not exceed use for more than 20/30 days  30 tablet  0  . lisinopril (PRINIVIL,ZESTRIL) 10 MG tablet Take 1 tablet (10 mg total) by mouth daily at 10 pm.  30 tablet  2  . OLANZapine (ZYPREXA) 10 MG tablet Take 10 mg by mouth at bedtime.      Marland Kitchen oxyCODONE (OXYCONTIN) 80 MG 12 hr tablet Take 1 tablet (80 mg total) by mouth every 12 (twelve) hours.  30 tablet  0  . polyethylene glycol (MIRALAX / GLYCOLAX) packet Take 17 g by mouth at bedtime.  14 each  0  . potassium chloride SA (K-DUR,KLOR-CON) 20 MEQ  tablet Take 1 tablet (20 mEq total) by mouth 2 (two) times daily.  60 tablet  1  . senna-docusate (SENOKOT-S) 8.6-50 MG per tablet Take 1 tablet by mouth 2 (two) times daily.  60 tablet  0  . zolpidem (AMBIEN) 10 MG tablet Take 1 tablet (10 mg total) by mouth at bedtime as needed. For sleep  30 tablet  2    Allergies: Morphine and related Past Medical History  Diagnosis Date  . Sickle cell anemia   . Blood transfusion   . 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 Hypokalemia   . Mood disorder   . Pulmonary embolism   . Avascular necrosis   . Leukocytosis     Chronic  . Thrombocytosis     Chronic  . Hypertension    Past Surgical History  Procedure Date  . Right hip replacement     08/2006  . Cholecystectomy     01/2008  . Porta cath placement   . Porta cath removal   . Umbilical hernia repair     01/2008  . Excision of left periauricular cyst     10/2009  . Excision of right ear lobe cyst with primary closur     11/2007  . Portacath placement 01/05/2012    Procedure: INSERTION PORT-A-CATH;  Surgeon: Adolph Pollack, MD;  Location: [facility] OR;  Service: General;  Laterality: N/A;  ultrasound guiced port a cath insertion with fluoroscopy   Family History  Problem Relation Age of Onset  . Sickle cell anemia Mother   . Sickle cell anemia Father   . Sickle cell trait Brother    History   Social History  . Marital Status: Single    Spouse Name: N/A    Number of Children: 0  . Years of Education: 13   Occupational History  . Unemployed    Social History Main Topics  . Smoking status: Former Smoker -- 13 years    Types: Cigarettes    Quit date: 07/08/2010  . Smokeless tobacco: Never Used  . Alcohol Use: No  . Drug Use: No     Comment: quit smoking 2011  . Sexually Active: Yes    Birth Control/ Protection: None   Other Topics Concern  . Not on file   Social History Narrative   Single.  Lives with girlfriend.      Review of Systems: 10  point review negative except noted in HPI  Physical Exam: Blood pressure 107/69, pulse 90, temperature 98.6 F (37 C), temperature source Oral, resp. rate 20, SpO2 98.00%.  General Appearance: Alert and oriented, cooperative, well developed, well nourished, mild distress  Head: Normocephalic, without obvious abnormality, atraumatic  Eyes: PERRL, slight strabismus, mild icteric sclera  Nose: Nares normal, septum midline, mucosa normal, no drainage or sinus tenderness.  Throat: Normal lips, mucosa, tongue and gums  Back: Symmetric,flank tenderness, lower back tenderness  Resp: even and nonlabored, diminished bilateral bases, no vocal fremitus; no wheezes/rales/rhonchi  Cardio: S1, S2 regular, no murmurs/clicks/rubs/gallops , Left chest porta cath assess GI: Soft, non tender , active bowel sounds, no masses, no organomegaly (bilateral side pain) Note BM last night)  Male Genitalia: Deferred, the patient denies priapism  Extremities: Extremities normal, atraumatic, no cyanosis or edema, negative Homans sign, no sign of DVTPulses: 2+ and symmetric  Skin: Skin color, texture, turgor normal, no rashes or lesions, numerous tattoos  Neurologic: Grossly normal, CN II-XII intact, no focal deficits  Psych: Appropriate/flat affect   Lab results: Results for orders placed during the hospital encounter of 02/02/12 (from the past 24 hour(s))  CBC WITH DIFFERENTIAL     Status: Abnormal   Collection Time   02/08/12  9:30 AM      Component Value Range   WBC 12.6 (*) 4.0 - 10.5 K/uL   RBC 2.43 (*) 4.22 - 5.81 MIL/uL   Hemoglobin 7.3 (*) 13.0 - 17.0 g/dL   HCT 11.9 (*) 14.7 - 82.9 %   MCV 88.1  78.0 - 100.0 fL   MCH 30.0  26.0 - 34.0 pg   MCHC 34.1  30.0 - 36.0 g/dL   RDW 56.2 (*) 13.0 - 86.5 %   Platelets 474 (*) 150 - 400 K/uL   Neutrophils Relative 49  43 - 77 %   Neutro Abs 6.2  1.7 - 7.7 K/uL   Lymphocytes Relative 24  12 - 46 %   Lymphs Abs 3.0  0.7 - 4.0 K/uL   Monocytes Relative 19 (*) 3 -  12 %   Monocytes Absolute 2.4 (*) 0.1 - 1.0 K/uL   Eosinophils Relative 7 (*) 0 - 5 %   Eosinophils Absolute 0.9 (*) 0.0 - 0.7 K/uL   Basophils Relative 2 (*) 0 - 1 %   Basophils Absolute 0.2 (*) 0.0 - 0.1 K/uL    Imaging results:  No results found.   Assessment & Plan: Patient Active Hospital Problem List:  Sickle Cell Vaso-occlusive Crisis-  Labs pending  IV hydration, anti-emetic prn, anti-pruretic prn. Trying to transition to home will use hydromorphone 4-8 mg Q 4hrs prn pain continue OxyContin 80 bid and use 1-2 mg  Hydromorphone for break thru pain.   Acute on Chronic Pain- PO Dilaudid 4-8 mg continue Oxycontin 80 mg BID and 1-2 mg IV Hydromorphone for break thru pain.  Hemolytic Anemia of SCD-  This AM was 7.3  Constipation- secondary to narcotic use continue  on Miralax qd and  Senna-S BID   Leukocytosis- 12.6 this AM   probable  Inflammatory/reactive process remains afebrile  Secondary Hemochromatosis-  IV Desferal night  HTN- stable  on Lisinopril 10mg  po daily   Mood Disorder- stable on Zyprexa continue  Insomnia: prn Ambein   [name], [name] 02/08/2012, 4:26 PM

## 2012-02-08 NOTE — Progress Notes (Signed)
Patient admitted to [facility] from the floor to "transition home".  Left chest port already accessed on admission, blood return noted, Infusing without difficulty.  Patient C/O left flank and rib cage pain.  Medicated per orders.  VSS, RN will continue to monitor.

## 2012-02-08 NOTE — Progress Notes (Signed)
Patient for discharge back to [facility] for transition home.  Report called to Lanae Boast, RN.  D/C instructions reviewed with patient using teach back method and patient verbalized understanding.  Patient stable for discharge to clinic.  Patient's Port-a-Cath flushed with 10 cc's NS and capped off for use at clinic.  Patient d/c'd via wheelchair to clinic.  [name] [facility]  02/08/2012

## 2012-02-09 DIAGNOSIS — D5700 Hb-SS disease with crisis, unspecified: Secondary | ICD-10-CM | POA: Diagnosis not present

## 2012-02-09 DIAGNOSIS — R079 Chest pain, unspecified: Secondary | ICD-10-CM | POA: Diagnosis not present

## 2012-02-09 LAB — COMPREHENSIVE METABOLIC PANEL
Alkaline Phosphatase: 90 U/L (ref 39–117)
BUN: 9 mg/dL (ref 6–23)
CO2: 24 mEq/L (ref 19–32)
Chloride: 103 mEq/L (ref 96–112)
GFR calc Af Amer: >90 mL/min (ref >90)
GFR calc non Af Amer: >90 mL/min (ref >90)
Glucose, Bld: 110 mg/dL — ABNORMAL HIGH (ref 70–99)
Potassium: 3.8 mEq/L (ref 3.5–5.1)
Total Bilirubin: 2.9 mg/dL — ABNORMAL HIGH (ref 0.3–1.2)

## 2012-02-09 LAB — CBC WITH DIFFERENTIAL/PLATELET
Basophils Absolute: 0.3 10*3/uL — ABNORMAL HIGH (ref 0.0–0.1)
Basophils Relative: 2 % — ABNORMAL HIGH (ref 0–1)
Eosinophils Relative: 8 % — ABNORMAL HIGH (ref 0–5)
HCT: 20.3 % — ABNORMAL LOW (ref 39.0–52.0)
Lymphs Abs: 2.7 10*3/uL (ref 0.7–4.0)
MCV: 89 fL (ref 78.0–100.0)
Monocytes Relative: 17 % — ABNORMAL HIGH (ref 3–12)
Neutro Abs: 6.6 10*3/uL (ref 1.7–7.7)
RDW: 19 % — ABNORMAL HIGH (ref 11.5–15.5)
WBC: 12.8 10*3/uL — ABNORMAL HIGH (ref 4.0–10.5)

## 2012-02-09 MED ORDER — OXYCODONE HCL 80 MG PO TB12: 80.0000 mg | ORAL_TABLET | Freq: Two times a day (BID) | ORAL | Status: DC

## 2012-02-09 MED ORDER — ZOLPIDEM TARTRATE 10 MG PO TABS: 10.0000 mg | ORAL_TABLET | Freq: Every evening | ORAL | Status: DC | PRN

## 2012-02-09 MED ORDER — HEPARIN SOD (PORK) LOCK FLUSH 100 UNIT/ML IV SOLN
500.0000 [IU] | INTRAVENOUS | Status: AC | PRN
  Administered 2012-02-09: 500 [IU]
  Filled 2012-02-09: qty 5

## 2012-02-09 MED ORDER — SODIUM CHLORIDE 0.9 % IJ SOLN
10.0000 mL | INTRAMUSCULAR | Status: AC | PRN
  Administered 2012-02-09: 10 mL

## 2012-02-09 MED ORDER — HYDROMORPHONE HCL 4 MG PO TABS: 4.0000 mg | ORAL_TABLET | ORAL | Status: DC | PRN

## 2012-02-09 NOTE — Progress Notes (Signed)
Patient ID: [name], male   DOB: 19-Jun-1980, 32 y.o.   MRN: [id number] Pt left ambulating at 1418hrs, no complaints of any pain, in no apparent distress.

## 2012-02-09 NOTE — Progress Notes (Signed)
Patient ID: [name], male   DOB: 10/08/1980, 32 y.o.   MRN: [id number] Patient A&Ox4, NAD, VSS, patient has no complaints at this time. This RN advised patient that his plan of care is to transition to PO medication, patient agreed. This RN will continue to monitor.

## 2012-02-09 NOTE — Progress Notes (Signed)
Patient ID: [name], male   DOB: October 29, 1980, 32 y.o.   MRN: [id number] Pt is discharged, flushed with heparin, de accessed port, site looks clean.  Prescriptions given, pt waiting for a ride

## 2012-02-09 NOTE — Progress Notes (Signed)
Patient Hb 6.7, [name] NP notified and is aware.

## 2012-02-09 NOTE — Progress Notes (Signed)
Received call from lab stating that pts hgb is critically low at 6.7. Will notify NP for day hospital.

## 2012-02-09 NOTE — Progress Notes (Signed)
Patient ID: [name], male   DOB: Oct 31, 1980, 32 y.o.   MRN: Script for dilaudid 4 mg po Q4hrs prn * quantity 60  Oxycodone 80 mg po q 12 hrs * quantity 30 Ambien 10mg  po QHS prn quantity 30   Given to pt

## 2012-02-09 NOTE — Discharge Summary (Signed)
[facility] Discharge Summary   Patient ID: [name] MRN: [id number] DOB/AGE: Sep 21, 1980 32 y.o.  Admit date: 02/08/2012 Discharge date: 02/09/2012  Primary Care Physician:  Willey Blade, MD  Admission Diagnoses:  Active Problems:  Sickle cell pain crisis  Leukocytosis  Acute on chronic pain Secondary Hemochromatosis  ACS Sickle cell Hemolytic Anemia  Hypertension Mood disorder  Constipation   Discharge Diagnoses:    Sickle cell pain crisis  Leukocytosis  Acute on chronic pain Secondary Hemochromatosis  ACS Sickle cell Hemolytic Anemia  Hypertension Mood disorder  Constipation   Discharge Medications:    Medication List     As of 02/09/2012  9:46 AM    ASK your doctor about these medications         folic acid 1 MG tablet   Commonly known as: FOLVITE   Take 1 tablet (1 mg total) by mouth daily.      HYDROmorphone 4 MG tablet   Commonly known as: DILAUDID   Take 1-2 tablets (4-8 mg total) by mouth every 4 (four) hours as needed for pain. For pain.      hydroxyurea 500 MG capsule   Commonly known as: HYDREA   Take 1 capsule (500 mg total) by mouth 2 (two) times daily. May take with food to minimize GI side effects.      ibuprofen 800 MG tablet   Commonly known as: ADVIL,MOTRIN   Take 1 tablet (800 mg total) by mouth every 8 (eight) hours as needed for pain. Do not exceed use for more than 20/30 days      lisinopril 10 MG tablet   Commonly known as: PRINIVIL,ZESTRIL   Take 1 tablet (10 mg total) by mouth daily at 10 pm.      OLANZapine 10 MG tablet   Commonly known as: ZYPREXA   Take 10 mg by mouth at bedtime.      oxyCODONE 80 MG 12 hr tablet   Commonly known as: OXYCONTIN   Take 1 tablet (80 mg total) by mouth every 12 (twelve) hours.      polyethylene glycol packet   Commonly known as: MIRALAX / GLYCOLAX   Take 17 g by mouth at bedtime.      potassium chloride SA 20 MEQ tablet   Commonly known as: K-DUR,KLOR-CON   Take 1  tablet (20 mEq total) by mouth 2 (two) times daily.      senna-docusate 8.6-50 MG per tablet   Commonly known as: Senokot-S   Take 1 tablet by mouth 2 (two) times daily.      zolpidem 10 MG tablet   Commonly known as: AMBIEN   Take 1 tablet (10 mg total) by mouth at bedtime as needed. For sleep         Consults:  None  Significant Diagnostic Studies:  Dg Chest 2 View  01/14/2012  *RADIOLOGY REPORT*  Clinical Data: Left chest pain, sickle cell crisis  CHEST - 2 VIEW  Comparison: 01/05/2012  Findings: Increased interstitial markings/bronchitic changes.  No focal consolidation. No pleural effusion or pneumothorax.  Stable mild cardiomegaly.  Left chest power port.  Visualized osseous structures are within normal limits.  Cholecystectomy clips.  IMPRESSION: No evidence of acute cardiopulmonary disease.  Increased interstitial markings/bronchitic changes, stable.   Original Report Authenticated By: Charline Bills, M.D.    Dg [name] W/small Bowel  01/16/2012  *RADIOLOGY REPORT*  Clinical Data:Intermittent nausea and vomiting.  Sickle cell.  UPPER GI W/ SMALL BOWEL  Technique: Upper  GI series performed with high density barium and effervescent agent. Thin barium also used.  Subsequently, serial images of the small bowel were obtained including spot views of the terminal ileum.  Fluoroscopy Time: 2.0 minutes  Comparison: None.  Findings: Scout view of the abdomen shows stool throughout the colon.  Surgical clips in the right upper quadrant.  No unexpected radiopaque calculi.  Right hip arthroplasty.  Double contrast examination of the upper gastrointestinal tract shows normal esophageal motility.  No esophageal fold thickening, stricture or obstruction.  Stomach and duodenal bulb are normal.  Small bowel transit time is approximately 5 hours and 15 minutes. Small bowel fold pattern is normal.  Terminal ileum is unremarkable.  IMPRESSION: Severe constipation.   Original Report Authenticated By: Leanna Battles, M.D.      [facility] Course:  For complete details please refer to admission H and P, but in brief, Mr. Willert has SS genotype Sickle cell disease was hospitalized on in patient service from 02/03/12-02/08/12 transition to the [name] for continue pain manage in preparation to be discharge to home on po medications. Initially pain control was difficult given 4 mg of IV hydromorphone and a understanding he would receive his long acting OxyContin 80mg  bid and 1-2 IV hydromorphone for break thru only. Verbalized and agreed upon treatment plan. He remained on gentle hydration and home medications resumed. Continues to have more of a discomfort in bilateral sides/ribcage as before a constant stabbing pain. T. Bili is trending downward indicating resolving crisis. Hemoytic anemia there is a variance in HGB trended down to 6.7 remains asymptomatic probable dilutional. Leukocytotaxis secondary to inflammatory/reactive phase of active hemolysis remained afebrile. Discharge in stable condition pain rating 3/10 requiring little to no medication for break thru.   Physical Exam at Discharge:  BP 100/57  Pulse 66  Temp 98.1 F (36.7 C) (Oral)  Resp 14  SpO2 99%  General Appearance: Alert and oriented, cooperative, well developed, well nourished, mild distress  Resp: even and nonlabored, diminished bilateral bases, no vocal fremitus; no wheezes/rales/rhonchi  Cardio: S1, S2 regular, no murmurs/clicks/rubs/gallops , Left chest porta cath assess  GI: Soft, non tender , active bowel sounds, no masses, no organomegaly (bilateral side pain) Note BM this AM )  Male Genitalia: Deferred, the patient denies priapism  Extremities: Extremities normal, atraumatic, no cyanosis or edema, negative Homans sign, no sign of DVTPulses: 2+ and symmetric  Psych: Appropriate/flat affect   Disposition at Discharge: [facility] Not Defined  Discharge Orders:   Condition at Discharge:    Stable  Time spent on Discharge:  Greater than 30 minutes.  Signed: [name], [name] 02/09/2012, 9:46 AM

## 2012-02-10 IMAGING — CR DG CHEST 2V
2 series · 2 of 2 positions shown · non-contrast
Comparison: 02/16/2009 and earlier.

CLINICAL DATA: 28-year-old male with pain and history of sickle
cell disease.

CHEST - 2 VIEW

[w chest pa *]
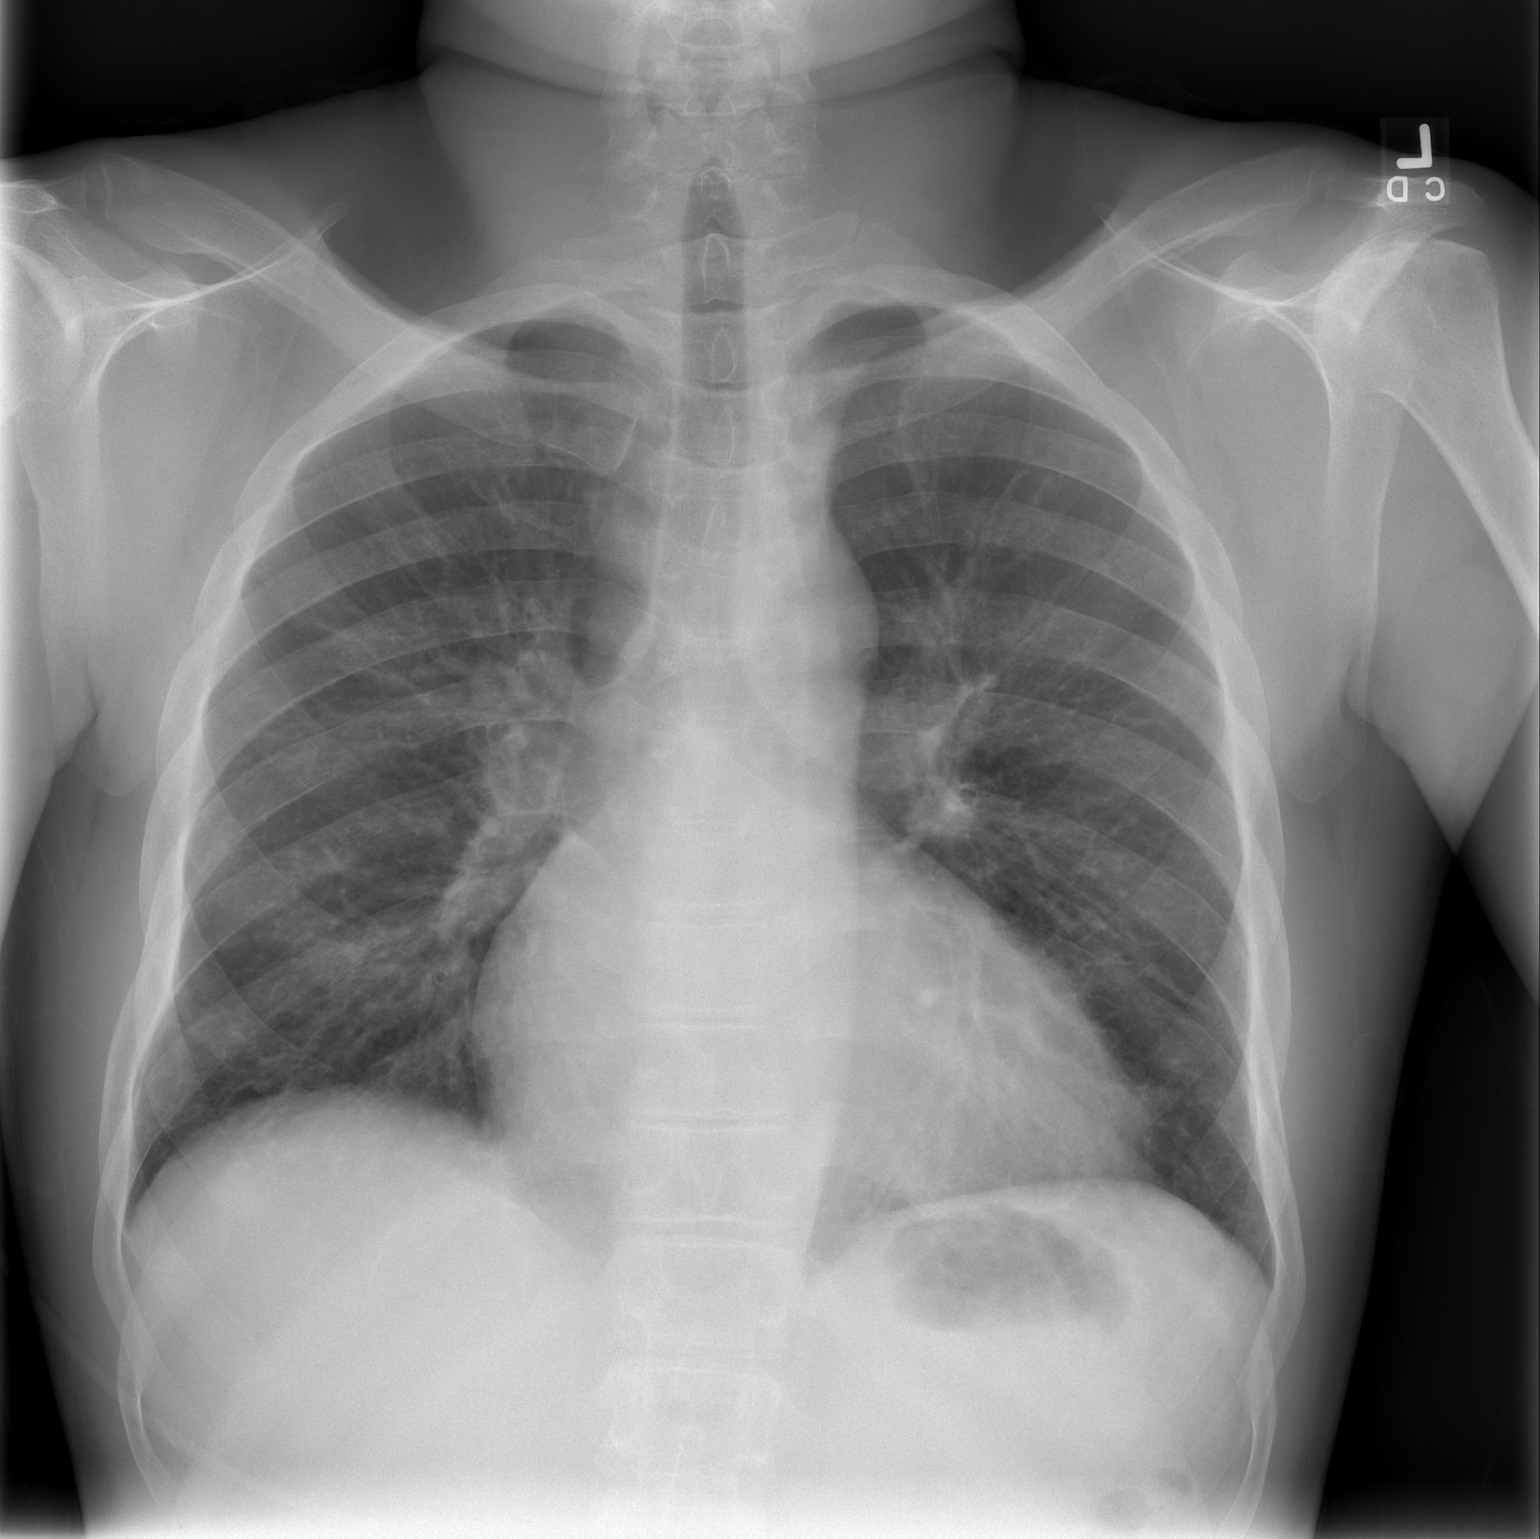

[w chest lat]
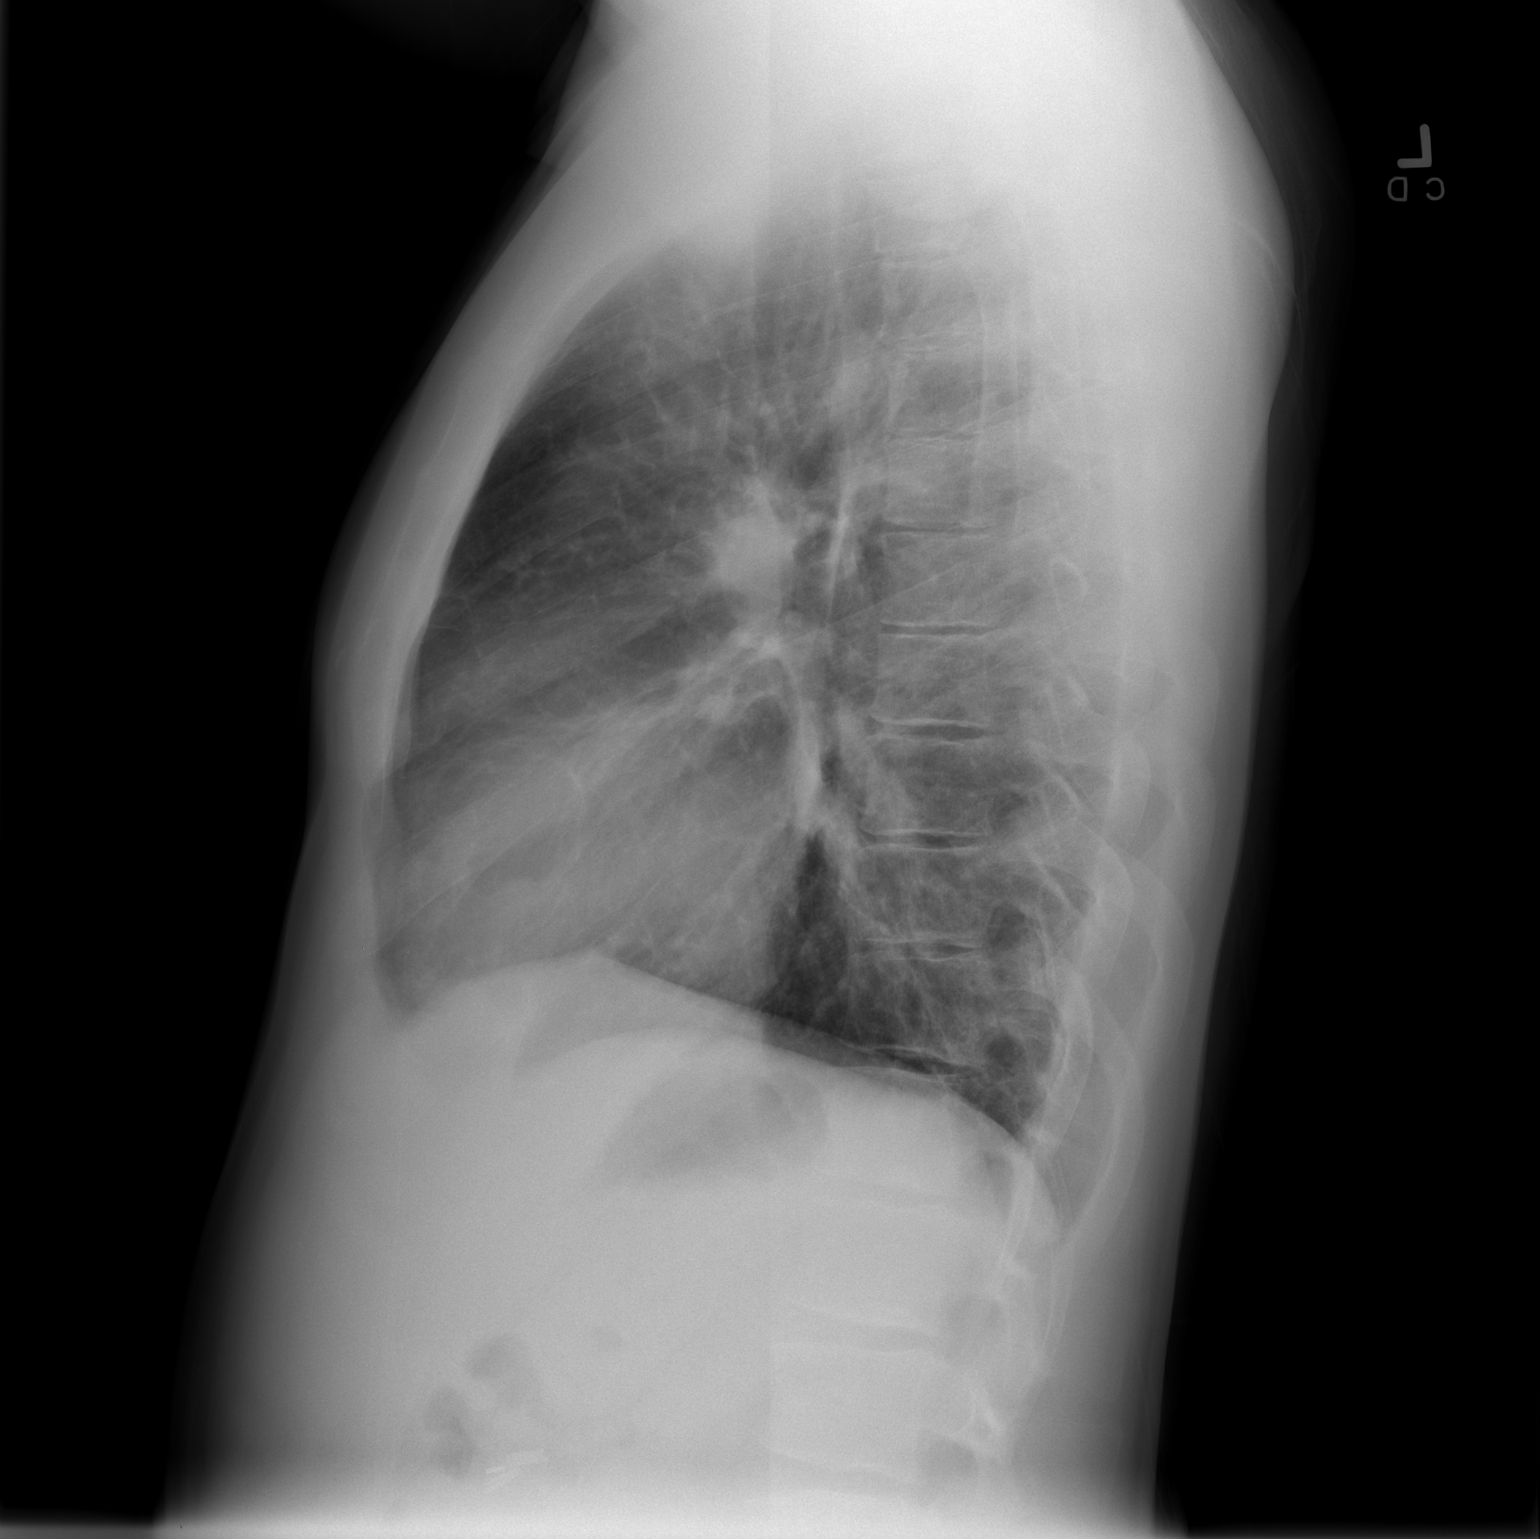

[2 of 2 positions shown; findings below may reference images not displayed]

FINDINGS: Stable cardiomegaly and mediastinal contours.  Lung
volumes are stable.  Chronic increased interstitial markings are
stable.  No pneumothorax, pleural effusion, or acute airspace
opacity. Visualized tracheal air column is within normal limits.
Stable visualized osseous structures.  Abdominal surgical clips re-
identified.
IMPRESSION: Stable. No acute cardiopulmonary abnormality.

## 2012-02-15 DIAGNOSIS — D5700 Hb-SS disease with crisis, unspecified: Secondary | ICD-10-CM | POA: Diagnosis not present

## 2012-02-19 DIAGNOSIS — D571 Sickle-cell disease without crisis: Secondary | ICD-10-CM | POA: Diagnosis not present

## 2012-02-20 DIAGNOSIS — D5700 Hb-SS disease with crisis, unspecified: Secondary | ICD-10-CM | POA: Diagnosis not present

## 2012-02-20 DIAGNOSIS — D649 Anemia, unspecified: Secondary | ICD-10-CM | POA: Diagnosis not present

## 2012-02-20 DIAGNOSIS — D72829 Elevated white blood cell count, unspecified: Secondary | ICD-10-CM | POA: Diagnosis not present

## 2012-02-21 DIAGNOSIS — D5700 Hb-SS disease with crisis, unspecified: Secondary | ICD-10-CM | POA: Diagnosis not present

## 2012-02-21 DIAGNOSIS — D649 Anemia, unspecified: Secondary | ICD-10-CM | POA: Diagnosis not present

## 2012-02-22 DIAGNOSIS — D5700 Hb-SS disease with crisis, unspecified: Secondary | ICD-10-CM | POA: Diagnosis not present

## 2012-02-22 NOTE — Discharge Summary (Signed)
[facility] Discharge Summary  Patient ID: [name] MRN: [id number] DOB/AGE: Jun 30, 1980 31 y.o.  Admit date: 11/07/2011 Discharge date: 11/08/2011  Admission Diagnoses:  Discharge Diagnoses:  Sickle cell crisis, resolved Hemoglobin SS disease Hypokalemia, improved Leukocytosis Mood disorder  Discharged Condition: Improved.  Clinic Course: Patient transferred to the sickle cell unit for further treatment after being hospitalized from 10/3-10/7. Patient was restarted on his Zyprexa 10 mg each bedtime for his mood. He was continued on Dilaudid 2-4 mg IV every 2 hours when necessary pain. His medication was tapered over the subsequent 23 hours. He was continued on his OxyContin as well. Toward the end of his unit stay, patient's pain was manageable with oral medications. He was subsequently discharged home improved.   Treatments: As noted.  Discharge vital signs: Blood pressure 118/76, pulse 90, temperature 98.3 F (36.8 C), temperature source Oral, resp. rate 18, height 6' (1.829 m), weight 177 lb 0.5 oz (80.3 kg), SpO2 100.00%.   Disposition: 01-Home or Self Care  Discharge Orders    Future Orders Please Complete By Expires   Diet - low sodium heart healthy      Increase activity slowly      No wound care      Call MD for:  severe uncontrolled pain      Discharge instructions      Comments:   Follow up with primary care physician in two weeks.     folic acid 1 mg daily Oxycodone 80 mg every 12 hours Hydromorphone 4 mg every 4 hours when necessary pain Ibuprofen 800 mg 3 times a day when necessary Potassium chloride 20 mg twice a day Temazepam 7.5 mg daily Lisinopril 20 mg daily.     Follow-up Information    Call in 14 days to follow up.   Contact information:   Pt given written and oral instructions to follow up with PMD in 2 wks, or go to ER for any emergencies. Verbalizes understanding.         SignedAugust Saucer, Shellene Sweigert 11/09/2011, 10:00  AM

## 2012-02-22 NOTE — Discharge Summary (Signed)
Physician Discharge Summary  Patient ID: [name] MRN: [id number] DOB/AGE: Apr 17, 1980 31 y.o.  Admit date: 11/03/2011 Discharge date: 11/07/2011   Discharge Diagnoses:   Sickle cell crisis Hemoglobin SS disease Hypokalemia Leukocytosis Avascular necrosis Mood disorder with explosive personality.  Discharged Condition: Improving. Patient still experiencing pain.  Operations/Procedues: None   Hospital Course: See admission H&P for details. Patient was admitted to the hospital for further treatment of a sickle cell crisis. He was noted to be hypokalemic with potassium 3.1. Hemoglobin 8.6. He was noted to have a leukocytosis as well. Patient was aggressively hydrated. His potassium was corrected with IV and by mouth medication. He was maintained on Dilaudid IV every 2 hours for control. He was maintained on his long-acting pain medication as well. Patient gradually improved with pain control. His potassium was corrected as noted. He did not require transfusion. On 10/ 7 patient became upset over his hydromorphone /Benadryl/Zofran regimen. This quickly escalated to patient becoming agitated and hostile toward the nurses. On medication review was noted patient had not been receiving his Zyprexa which had been his mood stabilizer. Patient thereafter was transferred to the sickle cell day unit. He was immediately restarted on his Zyprexa 10 mg daily. It was anticipated patient would be discharged to home the following morning.   Disposition: Sickle cell day unit.  Medications at the time of discharge from hospital:   Folic acid 1 mg daily Zyprexa 10 mg each bedtime Oxycodone 80 mg every 12 hours Potassium 20 mEq twice a day Zolpidem 10 mg each bedtime when necessary Hydroxyurea 500 mg twice a day Dilaudid 4 mg every 4 hours when necessary pain Ibuprofen 800 mg 3 times a day as needed.   SignedAugust Saucer, Dia Jefferys 11/07/2011, 10:00 PM

## 2012-02-23 DIAGNOSIS — D5700 Hb-SS disease with crisis, unspecified: Secondary | ICD-10-CM | POA: Diagnosis not present

## 2012-02-23 DIAGNOSIS — D649 Anemia, unspecified: Secondary | ICD-10-CM | POA: Diagnosis not present

## 2012-02-29 ENCOUNTER — Telehealth (HOSPITAL_COMMUNITY): Payer: Self-pay | Admitting: Hematology

## 2012-02-29 ENCOUNTER — Non-Acute Institutional Stay (HOSPITAL_COMMUNITY)
Admission: AD | Admit: 2012-02-29 | Discharge: 2012-03-01 | Disposition: A | Discharge status: Home / Self Care | Payer: Medicare Other | Source: Ambulatory Visit | Attending: Internal Medicine | Admitting: Internal Medicine

## 2012-02-29 ENCOUNTER — Encounter (HOSPITAL_COMMUNITY): Payer: Self-pay | Admitting: *Deleted

## 2012-02-29 DIAGNOSIS — R079 Chest pain, unspecified: Secondary | ICD-10-CM | POA: Insufficient documentation

## 2012-02-29 DIAGNOSIS — R0602 Shortness of breath: Secondary | ICD-10-CM | POA: Diagnosis not present

## 2012-02-29 DIAGNOSIS — F39 Unspecified mood [affective] disorder: Secondary | ICD-10-CM | POA: Diagnosis not present

## 2012-02-29 DIAGNOSIS — R1013 Epigastric pain: Secondary | ICD-10-CM | POA: Diagnosis not present

## 2012-02-29 DIAGNOSIS — D72829 Elevated white blood cell count, unspecified: Secondary | ICD-10-CM | POA: Diagnosis not present

## 2012-02-29 DIAGNOSIS — K5900 Constipation, unspecified: Secondary | ICD-10-CM | POA: Diagnosis not present

## 2012-02-29 DIAGNOSIS — D5700 Hb-SS disease with crisis, unspecified: Secondary | ICD-10-CM | POA: Diagnosis not present

## 2012-02-29 DIAGNOSIS — E876 Hypokalemia: Secondary | ICD-10-CM | POA: Diagnosis not present

## 2012-02-29 DIAGNOSIS — I10 Essential (primary) hypertension: Secondary | ICD-10-CM | POA: Insufficient documentation

## 2012-02-29 DIAGNOSIS — D599 Acquired hemolytic anemia, unspecified: Secondary | ICD-10-CM | POA: Diagnosis not present

## 2012-02-29 DIAGNOSIS — K3189 Other diseases of stomach and duodenum: Secondary | ICD-10-CM | POA: Diagnosis not present

## 2012-02-29 DIAGNOSIS — M549 Dorsalgia, unspecified: Secondary | ICD-10-CM | POA: Diagnosis not present

## 2012-02-29 LAB — CBC WITH DIFFERENTIAL/PLATELET
Basophils Absolute: 0.1 10*3/uL (ref 0.0–0.1)
Eosinophils Relative: 2 % (ref 0–5)
Lymphocytes Relative: 10 % — ABNORMAL LOW (ref 12–46)
MCV: 91.8 fL (ref 78.0–100.0)
Neutrophils Relative %: 75 % (ref 43–77)
Platelets: 534 10*3/uL — ABNORMAL HIGH (ref 150–400)
RDW: 23.3 % — ABNORMAL HIGH (ref 11.5–15.5)
WBC: 17.2 10*3/uL — ABNORMAL HIGH (ref 4.0–10.5)

## 2012-02-29 LAB — COMPREHENSIVE METABOLIC PANEL
Albumin: 4 g/dL (ref 3.5–5.2)
Alkaline Phosphatase: 75 U/L (ref 39–117)
BUN: 5 mg/dL — ABNORMAL LOW (ref 6–23)
CO2: 21 mEq/L (ref 19–32)
Chloride: 102 mEq/L (ref 96–112)
Creatinine, Ser: 0.53 mg/dL (ref 0.50–1.35)
GFR calc Af Amer: >90 mL/min (ref >90)
GFR calc non Af Amer: >90 mL/min (ref >90)
Glucose, Bld: 109 mg/dL — ABNORMAL HIGH (ref 70–99)
Potassium: 3.2 mEq/L — ABNORMAL LOW (ref 3.5–5.1)
Total Bilirubin: 4 mg/dL — ABNORMAL HIGH (ref 0.3–1.2)

## 2012-02-29 LAB — RETICULOCYTES: RBC.: 2.44 MIL/uL — ABNORMAL LOW (ref 4.22–5.81)

## 2012-02-29 MED ORDER — POTASSIUM CHLORIDE CRYS ER 20 MEQ PO TBCR
20.0000 meq | EXTENDED_RELEASE_TABLET | Freq: Two times a day (BID) | ORAL | Status: DC
  Administered 2012-02-29: 20 meq via ORAL
  Filled 2012-02-29: qty 1

## 2012-02-29 MED ORDER — POLYETHYLENE GLYCOL 3350 17 G PO PACK
17.0000 g | PACK | Freq: Every day | ORAL | Status: DC
Start: 2012-02-29 — End: 2012-03-01
  Filled 2012-02-29 (×2): qty 1

## 2012-02-29 MED ORDER — KETOROLAC TROMETHAMINE 30 MG/ML IJ SOLN
30.0000 mg | Freq: Four times a day (QID) | INTRAMUSCULAR | Status: DC
  Administered 2012-02-29 – 2012-03-01 (×3): 30 mg via INTRAVENOUS
  Filled 2012-02-29 (×3): qty 1

## 2012-02-29 MED ORDER — OLANZAPINE 10 MG PO TABS
10.0000 mg | ORAL_TABLET | Freq: Every day | ORAL | Status: DC
  Administered 2012-02-29: 10 mg via ORAL
  Filled 2012-02-29 (×2): qty 1

## 2012-02-29 MED ORDER — DIPHENHYDRAMINE HCL 50 MG/ML IJ SOLN
12.5000 mg | INTRAMUSCULAR | Status: DC | PRN
  Administered 2012-02-29 (×2): 25 mg via INTRAVENOUS
  Filled 2012-02-29 (×2): qty 1

## 2012-02-29 MED ORDER — SENNOSIDES-DOCUSATE SODIUM 8.6-50 MG PO TABS: 1.0000 | ORAL_TABLET | Freq: Two times a day (BID) | ORAL | Status: DC

## 2012-02-29 MED ORDER — ONDANSETRON HCL 4 MG/2ML IJ SOLN
4.0000 mg | INTRAMUSCULAR | Status: DC | PRN
  Administered 2012-02-29 – 2012-03-01 (×4): 4 mg via INTRAVENOUS
  Filled 2012-02-29 (×4): qty 2

## 2012-02-29 MED ORDER — HYDROXYUREA 500 MG PO CAPS
500.0000 mg | ORAL_CAPSULE | Freq: Two times a day (BID) | ORAL | Status: DC
  Administered 2012-02-29: 500 mg via ORAL
  Filled 2012-02-29: qty 1

## 2012-02-29 MED ORDER — ZOLPIDEM TARTRATE 5 MG PO TABS: 10.0000 mg | ORAL_TABLET | Freq: Every evening | ORAL | Status: DC | PRN

## 2012-02-29 MED ORDER — DEXTROSE-NACL 5-0.45 % IV SOLN
INTRAVENOUS | Status: DC
  Administered 2012-02-29 (×2): via INTRAVENOUS

## 2012-02-29 MED ORDER — LISINOPRIL 10 MG PO TABS
10.0000 mg | ORAL_TABLET | Freq: Every day | ORAL | Status: DC
  Administered 2012-02-29: 10 mg via ORAL
  Filled 2012-02-29 (×2): qty 1

## 2012-02-29 MED ORDER — DEFEROXAMINE MESYLATE 2 G IJ SOLR
2000.0000 mg | Freq: Every day | INTRAMUSCULAR | Status: AC
  Administered 2012-02-29: 2000 mg via INTRAVENOUS
  Filled 2012-02-29: qty 2

## 2012-02-29 MED ORDER — FOLIC ACID 1 MG PO TABS: 1.0000 mg | ORAL_TABLET | Freq: Every day | ORAL | Status: DC

## 2012-02-29 MED ORDER — DIPHENHYDRAMINE HCL 25 MG PO CAPS: 25.0000 mg | ORAL_CAPSULE | ORAL | Status: DC | PRN

## 2012-02-29 MED ORDER — HYDROMORPHONE HCL PF 2 MG/ML IJ SOLN
2.0000 mg | INTRAMUSCULAR | Status: DC | PRN
  Administered 2012-02-29: 4 mg via INTRAVENOUS
  Administered 2012-02-29: 3 mg via INTRAVENOUS
  Administered 2012-02-29: 4 mg via INTRAVENOUS
  Administered 2012-02-29 – 2012-03-01 (×3): 3 mg via INTRAVENOUS
  Administered 2012-03-01: 2 mg via INTRAVENOUS
  Filled 2012-02-29 (×2): qty 2
  Filled 2012-02-29: qty 1
  Filled 2012-02-29 (×4): qty 2

## 2012-02-29 MED ORDER — OXYCODONE HCL ER 80 MG PO T12A
80.0000 mg | EXTENDED_RELEASE_TABLET | Freq: Two times a day (BID) | ORAL | Status: DC
  Administered 2012-02-29: 80 mg via ORAL
  Filled 2012-02-29: qty 1

## 2012-02-29 MED ORDER — ONDANSETRON HCL 4 MG PO TABS: 4.0000 mg | ORAL_TABLET | ORAL | Status: DC | PRN

## 2012-02-29 NOTE — H&P (Signed)
[facility] History and Physical   Date: 02/29/2012  Patient name: [name] Medical record number: [id number] Date of birth: 1980/08/17 Age: 32 y.o. Gender: male PCP: [name], [name]  Attending physician: Altha Harm, [name]  Chief Complaint: pain bilateral ribs, low back and progressively increasing SOB/DOE x 2 days  History of Present Illness: [name] is a 32 year old African American male with SS genotype Sickle cell disease. He called the [facility] today with reports abdominal pain but on arrival reports progressively increased SOB/DOE x 2 days and complaints pain bilateral ribs, and low back not relieved with his home medication regimen. He describes this pain as constant sharp and rated 8/10 prior to and 7/10 following his first dose of IV Dilaudid. Of note, he reports recent crisis pain admission at [facility] 2-3 weeks ago. He was discharged to continue his previous home medications with no new Rx given. He was also scheduled regular office visit with Dr. [name] on Monday 02/27/12 but reports not being aware he had an appointment. He reports having a Hgb 6.6 at the time he was discharged from [facility] and denies any ED evaluations since then. He currently denies fever, chills, nausea, vomiting, abdominal pain, LUTS, priapism, or headache, but reports some dizziness when asked to sit up and take a deep breath. This resolved spontaneously with sitting still and was not associated with increased SOB or chest pain. His appetite is fair and last BM yesterday evening.   Meds: Prescriptions prior to admission  Medication Sig Dispense Refill  . folic acid (FOLVITE) 1 MG tablet Take 1 tablet (1 mg total) by mouth daily.  100 tablet  2  . HYDROmorphone (DILAUDID) 4 MG tablet Take 1-2 tablets (4-8 mg total) by mouth every 4 (four) hours as needed for pain. For pain.  60 tablet  0  . hydroxyurea (HYDREA) 500 MG capsule Take 1 capsule (500 mg total) by mouth 2 (two) times daily.  May take with food to minimize GI side effects.  60 capsule  2  . ibuprofen (ADVIL,MOTRIN) 800 MG tablet Take 1 tablet (800 mg total) by mouth every 8 (eight) hours as needed for pain. Do not exceed use for more than 20/30 days  30 tablet  0  . lisinopril (PRINIVIL,ZESTRIL) 10 MG tablet Take 1 tablet (10 mg total) by mouth daily at 10 pm.  30 tablet  2  . OLANZapine (ZYPREXA) 10 MG tablet Take 10 mg by mouth at bedtime.      Marland Kitchen oxyCODONE (OXYCONTIN) 80 MG 12 hr tablet Take 1 tablet (80 mg total) by mouth every 12 (twelve) hours.  30 tablet  0  . polyethylene glycol (MIRALAX / GLYCOLAX) packet Take 17 g by mouth at bedtime.  14 each  0  . potassium chloride SA (K-DUR,KLOR-CON) 20 MEQ tablet Take 1 tablet (20 mEq total) by mouth 2 (two) times daily.  60 tablet  1  . senna-docusate (SENOKOT-S) 8.6-50 MG per tablet Take 1 tablet by mouth 2 (two) times daily.  60 tablet  0  . zolpidem (AMBIEN) 10 MG tablet Take 1 tablet (10 mg total) by mouth at bedtime as needed. For sleep  30 tablet  2    Allergies: Morphine and related Past Medical History  Diagnosis Date  . Sickle cell anemia   . Blood transfusion   . 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 Hypokalemia   . Mood disorder   . Pulmonary  embolism   . Avascular necrosis   . Leukocytosis     Chronic  . Thrombocytosis     Chronic  . Hypertension    Past Surgical History  Procedure Date  . Right hip replacement     08/2006  . Cholecystectomy     01/2008  . Porta cath placement   . Porta cath removal   . Umbilical hernia repair     01/2008  . Excision of left periauricular cyst     10/2009  . Excision of right ear lobe cyst with primary closur     11/2007  . Portacath placement 01/05/2012    Procedure: INSERTION PORT-A-CATH;  Surgeon: Adolph Pollack, [name];  Location: [facility] OR;  Service: General;  Laterality: N/A;  ultrasound guiced port a cath insertion with fluoroscopy   Family History  Problem Relation Age of  Onset  . Sickle cell anemia Mother   . Sickle cell anemia Father   . Sickle cell trait Brother    History   Social History  . Marital Status: Single    Spouse Name: N/A    Number of Children: 0  . Years of Education: 13   Occupational History  . Unemployed    Social History Main Topics  . Smoking status: Former Smoker -- 13 years    Types: Cigarettes    Quit date: 07/08/2010  . Smokeless tobacco: Never Used  . Alcohol Use: No  . Drug Use: No     Comment: quit smoking 2011  . Sexually Active: Yes    Birth Control/ Protection: None   Other Topics Concern  . Not on file   Social History Narrative   Single.  Lives with girlfriend.      Review of Systems: As noted HPI; otherwise negative  Physical Exam: Blood pressure 107/71, pulse 89, temperature 98.7 F (37.1 C), temperature source Oral, resp. rate 18, SpO2 99.00%.  General Appearance: Alert, Ox3, pleasant and cooperative, WD, WN, NAD.  Head: Normocephalic, atraumatic  Eyes: PERRL, slight strabismus, mild-mod icteric sclera  Back: Symmetric, no CVA tenderness  Resp: even and nonlabored, diminished bilateral bases, no vocal fremitus; no wheezes/rales/rhonchi  Cardio: S1, S2 regular, no murmurs/clicks/rubs/gallops  GI: Soft, NT, ND, + bowel sounds, no masses or organomegaly appreciated  Male Genitalia: Deferred; denies priapism  Extremities/MSK: Extremities normal, atraumatic, no cyanosis or edema, negative Homans; Left chest Port-A-Cath  Pulses: 2+ and symmetric  Skin: Skin intact  Neurologic: Grossly intact, no focal deficits  Psych: Appropriate affect   Lab results: Results for orders placed during the hospital encounter of 02/29/12 (from the past 24 hour(s))  COMPREHENSIVE METABOLIC PANEL     Status: Abnormal   Collection Time   02/29/12  3:28 PM      Component Value Range   Sodium 136  135 - 145 mEq/L   Potassium 3.2 (*) 3.5 - 5.1 mEq/L   Chloride 102  96 - 112 mEq/L   CO2 21  19 - 32 mEq/L   Glucose,  Bld 109 (*) 70 - 99 mg/dL   BUN 5 (*) 6 - 23 mg/dL   Creatinine, Ser 7.82  0.50 - 1.35 mg/dL   Calcium 9.0  8.4 - 95.6 mg/dL   Total Protein 7.9  6.0 - 8.3 g/dL   Albumin 4.0  3.5 - 5.2 g/dL   AST 32  0 - 37 U/L   ALT 20  0 - 53 U/L   Alkaline Phosphatase 75  39 - 117 U/L  Total Bilirubin 4.0 (*) 0.3 - 1.2 mg/dL   GFR calc non Af Amer >90  >90 mL/min   GFR calc Af Amer >90  >90 mL/min    Imaging results:  No results found.   Assessment & Plan: 1) Sickle Cell Vaso-occlusive Pain Crisis- SS Disease with T. Bili 4.0 on admission and evidence of active hemolysis. We will give gentle IV hydration, anti-emetic prn, anti-pruretic prn, and bowel management regimen. His most recent Hgb electrophoresis was obtained on 12/17/11 in which he had a Hgb A 50.2, Hgb F 6.1%, and Hgb S 40.6%. We will continue his current home dose Hydrea 500mg  BID and folic acid daily; however, he was previously increased to Hydrea 1000 mg am and 500 mg qhs during a previous admission and unclear at this time (needs to be addressed further prior to discharge). We will treat his pain with Dilaudid 2-4 mg IV every 2 hrs prn and continue his long-acting Oxycontin 80 mg BID.  2) Bilateral Rib Pain- 98% sat on room air; not associated with chest pain; we will start IV Toradol 30 mg every 6 hrs, as this has worked for him in the past; resume his home dose Motrin prn after discharge.   3) Leukocytosis- WBC 17.2; afebrile (98.7); suspect secondary to inflammation with Vaso-occlusive crisis; IV Toradol as noted above  3) Hemolytic Anemia of SCD- reports progressively increasing SOB/DOE with Hgb 6.6 when discharged from [facility] 2 weeks ago. He became dizzy with position change during assessment today. BP soft; we will continue IVF, start supplemental oxygen and await results CBC pending; plan transfuse < 7.0 noting his baseline Hgb of 7.5-8.0.   4) Constipation- problematic in the past in which he was noted to have early satiety and  weight loss and an UGI with small bowel on 01/16/12 revealed severe constipation. He was placed on a bowel management regimen and since then has been having daily BM's, he no longer has early am N/V, and his weight remains stable (77.6 kg on 01/23/12 and 78.3 kg on adm today). We will continue his home med's Lactulose 30 gm qhs, and Senna S-BID for now    5) Secondary Hemochromatosis-  On Exjade daily at home; most recent Ferritin level 2022 on 12/13/11. We will start IV Desferal q hs while here and resume his home dose Exjade at discharge.    6) HTN- BP soft on adm; continue his home dose of Lisinopril 10mg  po daily with parameters to hold for SBP <110.   7) Hypokalemia, chronic- K level 3.2 on adm; we will continue his home dose of potassium supplement (20 meq BID) and e-evaluate with am labs.   8) Mood Disorder- pleasant at this time; previously stable on his home dose of Zyprexa. We will continue the same.   We have discussed the plan of care for this patient with Dr. Horton Finer, Taygen Newsome S 02/29/2012, 4:35 PM

## 2012-03-01 ENCOUNTER — Inpatient Hospital Stay (HOSPITAL_COMMUNITY)
Admission: EM | Admit: 2012-03-01 | Discharge: 2012-03-05 | DRG: 175 | Disposition: A | Discharge status: Home / Self Care | Payer: Medicare Other | Attending: Internal Medicine | Admitting: Internal Medicine

## 2012-03-01 ENCOUNTER — Other Ambulatory Visit: Payer: Self-pay

## 2012-03-01 ENCOUNTER — Emergency Department (HOSPITAL_COMMUNITY): Payer: Medicare Other

## 2012-03-01 ENCOUNTER — Encounter (HOSPITAL_COMMUNITY): Payer: Self-pay | Admitting: Emergency Medicine

## 2012-03-01 DIAGNOSIS — J984 Other disorders of lung: Secondary | ICD-10-CM | POA: Diagnosis not present

## 2012-03-01 DIAGNOSIS — Z96649 Presence of unspecified artificial hip joint: Secondary | ICD-10-CM

## 2012-03-01 DIAGNOSIS — K5900 Constipation, unspecified: Secondary | ICD-10-CM | POA: Diagnosis not present

## 2012-03-01 DIAGNOSIS — I2699 Other pulmonary embolism without acute cor pulmonale: Principal | ICD-10-CM

## 2012-03-01 DIAGNOSIS — D599 Acquired hemolytic anemia, unspecified: Secondary | ICD-10-CM | POA: Diagnosis not present

## 2012-03-01 DIAGNOSIS — D5700 Hb-SS disease with crisis, unspecified: Secondary | ICD-10-CM

## 2012-03-01 DIAGNOSIS — Z79899 Other long term (current) drug therapy: Secondary | ICD-10-CM

## 2012-03-01 DIAGNOSIS — D72829 Elevated white blood cell count, unspecified: Secondary | ICD-10-CM | POA: Diagnosis present

## 2012-03-01 DIAGNOSIS — F39 Unspecified mood [affective] disorder: Secondary | ICD-10-CM

## 2012-03-01 DIAGNOSIS — D473 Essential (hemorrhagic) thrombocythemia: Secondary | ICD-10-CM | POA: Diagnosis present

## 2012-03-01 DIAGNOSIS — J9600 Acute respiratory failure, unspecified whether with hypoxia or hypercapnia: Secondary | ICD-10-CM | POA: Diagnosis present

## 2012-03-01 DIAGNOSIS — M8700 Idiopathic aseptic necrosis of unspecified bone: Secondary | ICD-10-CM

## 2012-03-01 DIAGNOSIS — D75839 Thrombocytosis, unspecified: Secondary | ICD-10-CM

## 2012-03-01 DIAGNOSIS — I82C11 Acute embolism and thrombosis of right internal jugular vein: Secondary | ICD-10-CM

## 2012-03-01 DIAGNOSIS — E876 Hypokalemia: Secondary | ICD-10-CM

## 2012-03-01 DIAGNOSIS — D571 Sickle-cell disease without crisis: Secondary | ICD-10-CM

## 2012-03-01 DIAGNOSIS — I10 Essential (primary) hypertension: Secondary | ICD-10-CM | POA: Diagnosis not present

## 2012-03-01 DIAGNOSIS — R079 Chest pain, unspecified: Secondary | ICD-10-CM | POA: Diagnosis not present

## 2012-03-01 LAB — CBC WITH DIFFERENTIAL/PLATELET
Eosinophils Relative: 4 % (ref 0–5)
Monocytes Relative: 14 % — ABNORMAL HIGH (ref 3–12)
Neutrophils Relative %: 59 % (ref 43–77)
Platelets: 485 10*3/uL — ABNORMAL HIGH (ref 150–400)
RBC: 2.26 MIL/uL — ABNORMAL LOW (ref 4.22–5.81)
WBC: 16.2 10*3/uL — ABNORMAL HIGH (ref 4.0–10.5)

## 2012-03-01 LAB — PROTIME-INR: INR: 1.41 (ref 0.00–1.49)

## 2012-03-01 LAB — COMPREHENSIVE METABOLIC PANEL
Albumin: 3.7 g/dL (ref 3.5–5.2)
BUN: 6 mg/dL (ref 6–23)
CO2: 23 mEq/L (ref 19–32)
Chloride: 103 mEq/L (ref 96–112)
Creatinine, Ser: 0.68 mg/dL (ref 0.50–1.35)
GFR calc non Af Amer: >90 mL/min (ref >90)
Total Bilirubin: 2.7 mg/dL — ABNORMAL HIGH (ref 0.3–1.2)

## 2012-03-01 LAB — MAGNESIUM: Magnesium: 1.8 mg/dL (ref 1.5–2.5)

## 2012-03-01 MED ORDER — ENOXAPARIN SODIUM 80 MG/0.8ML ~~LOC~~ SOLN
1.0000 mg/kg | Freq: Two times a day (BID) | SUBCUTANEOUS | Status: DC
  Administered 2012-03-02 – 2012-03-04 (×6): 80 mg via SUBCUTANEOUS
  Filled 2012-03-01 (×9): qty 0.8

## 2012-03-01 MED ORDER — POTASSIUM CHLORIDE CRYS ER 20 MEQ PO TBCR
40.0000 meq | EXTENDED_RELEASE_TABLET | Freq: Once | ORAL | Status: AC
  Administered 2012-03-01: 40 meq via ORAL
  Filled 2012-03-01: qty 2

## 2012-03-01 MED ORDER — DIPHENHYDRAMINE HCL 25 MG PO CAPS
25.0000 mg | ORAL_CAPSULE | Freq: Three times a day (TID) | ORAL | Status: DC | PRN
  Administered 2012-03-01: 25 mg via ORAL
  Filled 2012-03-01: qty 1

## 2012-03-01 MED ORDER — WARFARIN - PHARMACIST DOSING INPATIENT: Freq: Every day | Status: DC

## 2012-03-01 MED ORDER — HYDROMORPHONE HCL PF 2 MG/ML IJ SOLN
2.0000 mg | INTRAMUSCULAR | Status: DC | PRN
  Administered 2012-03-01: 3 mg via INTRAVENOUS
  Administered 2012-03-01: 2 mg via INTRAVENOUS
  Administered 2012-03-01 (×2): 3 mg via INTRAVENOUS
  Administered 2012-03-02: 2 mg via INTRAVENOUS
  Administered 2012-03-02 (×2): 3 mg via INTRAVENOUS
  Administered 2012-03-02 (×3): 2 mg via INTRAVENOUS
  Administered 2012-03-02 (×2): 3 mg via INTRAVENOUS
  Filled 2012-03-01: qty 2
  Filled 2012-03-01: qty 1
  Filled 2012-03-01 (×3): qty 2
  Filled 2012-03-01 (×3): qty 1
  Filled 2012-03-01 (×4): qty 2

## 2012-03-01 MED ORDER — HYDROMORPHONE HCL 4 MG PO TABS: 4.0000 mg | ORAL_TABLET | ORAL | Status: DC | PRN

## 2012-03-01 MED ORDER — OXYCODONE HCL ER 40 MG PO T12A
80.0000 mg | EXTENDED_RELEASE_TABLET | Freq: Two times a day (BID) | ORAL | Status: DC
  Administered 2012-03-01 – 2012-03-05 (×8): 80 mg via ORAL
  Filled 2012-03-01 (×8): qty 2

## 2012-03-01 MED ORDER — ZOLPIDEM TARTRATE 10 MG PO TABS: 10.0000 mg | ORAL_TABLET | Freq: Every evening | ORAL | Status: DC | PRN

## 2012-03-01 MED ORDER — HYDROMORPHONE HCL PF 2 MG/ML IJ SOLN: 2.0000 mg | INTRAMUSCULAR | Status: DC | PRN

## 2012-03-01 MED ORDER — POTASSIUM CHLORIDE CRYS ER 20 MEQ PO TBCR
20.0000 meq | EXTENDED_RELEASE_TABLET | Freq: Two times a day (BID) | ORAL | Status: DC
  Administered 2012-03-01 – 2012-03-05 (×8): 20 meq via ORAL
  Filled 2012-03-01 (×9): qty 1

## 2012-03-01 MED ORDER — POLYETHYLENE GLYCOL 3350 17 G PO PACK
17.0000 g | PACK | Freq: Every day | ORAL | Status: DC
  Administered 2012-03-01: 17 g via ORAL
  Filled 2012-03-01 (×5): qty 1

## 2012-03-01 MED ORDER — HYDROMORPHONE HCL PF 1 MG/ML IJ SOLN
1.0000 mg | Freq: Once | INTRAMUSCULAR | Status: AC
  Administered 2012-03-01: 1 mg via INTRAVENOUS
  Filled 2012-03-01: qty 1

## 2012-03-01 MED ORDER — COUMADIN BOOK
Freq: Once | Status: AC
  Administered 2012-03-01: 15:00:00
  Filled 2012-03-01: qty 1

## 2012-03-01 MED ORDER — IOHEXOL 350 MG/ML SOLN
100.0000 mL | Freq: Once | INTRAVENOUS | Status: AC | PRN
  Administered 2012-03-01: 100 mL via INTRAVENOUS

## 2012-03-01 MED ORDER — OLANZAPINE 10 MG PO TABS
10.0000 mg | ORAL_TABLET | Freq: Every day | ORAL | Status: DC
Start: 2012-03-01 — End: 2012-03-05
  Administered 2012-03-01 – 2012-03-04 (×4): 10 mg via ORAL
  Filled 2012-03-01 (×6): qty 1

## 2012-03-01 MED ORDER — ONDANSETRON HCL 4 MG/2ML IJ SOLN
4.0000 mg | Freq: Four times a day (QID) | INTRAMUSCULAR | Status: DC | PRN
  Administered 2012-03-01 – 2012-03-05 (×10): 4 mg via INTRAVENOUS
  Filled 2012-03-01 (×10): qty 2

## 2012-03-01 MED ORDER — HYDROXYUREA 500 MG PO CAPS
500.0000 mg | ORAL_CAPSULE | Freq: Two times a day (BID) | ORAL | Status: DC
  Administered 2012-03-01 – 2012-03-05 (×8): 500 mg via ORAL
  Filled 2012-03-01 (×9): qty 1

## 2012-03-01 MED ORDER — ENOXAPARIN SODIUM 60 MG/0.6ML ~~LOC~~ SOLN
1.0000 mg/kg | Freq: Once | SUBCUTANEOUS | Status: AC
  Administered 2012-03-01: 80 mg via SUBCUTANEOUS
  Filled 2012-03-01: qty 1.2

## 2012-03-01 MED ORDER — WARFARIN SODIUM 10 MG PO TABS
10.0000 mg | ORAL_TABLET | Freq: Once | ORAL | Status: AC
  Administered 2012-03-01: 10 mg via ORAL
  Filled 2012-03-01: qty 1

## 2012-03-01 MED ORDER — POTASSIUM CHLORIDE CRYS ER 20 MEQ PO TBCR
40.0000 meq | EXTENDED_RELEASE_TABLET | ORAL | Status: DC
  Administered 2012-03-01: 40 meq via ORAL
  Filled 2012-03-01: qty 2

## 2012-03-01 MED ORDER — FOLIC ACID 1 MG PO TABS
1.0000 mg | ORAL_TABLET | Freq: Every day | ORAL | Status: DC
  Administered 2012-03-01 – 2012-03-05 (×5): 1 mg via ORAL
  Filled 2012-03-01 (×5): qty 1

## 2012-03-01 MED ORDER — SODIUM CHLORIDE 0.9 % IV SOLN
INTRAVENOUS | Status: AC
  Administered 2012-03-01: 15:00:00 via INTRAVENOUS

## 2012-03-01 MED ORDER — SODIUM CHLORIDE 0.9 % IV BOLUS (SEPSIS)
1000.0000 mL | Freq: Once | INTRAVENOUS | Status: AC
  Administered 2012-03-01: 1000 mL via INTRAVENOUS

## 2012-03-01 MED ORDER — ONDANSETRON HCL 4 MG PO TABS: 4.0000 mg | ORAL_TABLET | Freq: Four times a day (QID) | ORAL | Status: DC | PRN

## 2012-03-01 MED ORDER — WARFARIN VIDEO
Freq: Once | Status: AC
  Administered 2012-03-01: 18:00:00

## 2012-03-01 MED ORDER — HYDROMORPHONE HCL PF 2 MG/ML IJ SOLN
2.0000 mg | INTRAMUSCULAR | Status: DC | PRN
  Administered 2012-03-01 (×2): 2 mg via INTRAVENOUS
  Filled 2012-03-01 (×2): qty 1

## 2012-03-01 MED ORDER — SENNOSIDES-DOCUSATE SODIUM 8.6-50 MG PO TABS
1.0000 | ORAL_TABLET | Freq: Two times a day (BID) | ORAL | Status: DC
  Administered 2012-03-01 – 2012-03-05 (×6): 1 via ORAL
  Filled 2012-03-01 (×9): qty 1

## 2012-03-01 MED ORDER — IBUPROFEN 800 MG PO TABS: 800.0000 mg | ORAL_TABLET | Freq: Three times a day (TID) | ORAL | Status: DC | PRN

## 2012-03-01 MED ORDER — LISINOPRIL 10 MG PO TABS
10.0000 mg | ORAL_TABLET | Freq: Every day | ORAL | Status: DC
  Administered 2012-03-01 – 2012-03-04 (×4): 10 mg via ORAL
  Filled 2012-03-01 (×6): qty 1

## 2012-03-01 MED ORDER — POTASSIUM CHLORIDE CRYS ER 20 MEQ PO TBCR
20.0000 meq | EXTENDED_RELEASE_TABLET | Freq: Two times a day (BID) | ORAL | Status: DC
  Filled 2012-03-01: qty 1

## 2012-03-01 NOTE — ED Provider Notes (Signed)
History     CSN: [id number]  Arrival date & time 03/01/12  1044   First MD Initiated Contact with Patient 03/01/12 1103      Chief Complaint  Patient presents with  . short of breath with exertion     (Consider location/radiation/quality/duration/timing/severity/associated sxs/prior treatment) HPI  The patient presents with concerns of ongoing chest pain, new dyspnea on exertion.  Notably, he was being treated in the sickle cell clinic, where he was found to be hypoxic with exertion.  He was sent here for evaluation.  The patient states that his dyspnea began several days ago, without clear precipitant.  Since onset he has had dyspnea on exertion, but no significant change at rest.  There is no new exertional chest pain.  He states a long history of pain across the anterior chest is largely unchanged, minimally improved with significant narcotic use. He denies any fever, chills, syncope, nausea, vomiting, diarrhea.  He endorses a distant history of thromboembolism.  Past Medical History  Diagnosis Date  . Sickle cell anemia   . Blood transfusion   . 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 Hypokalemia   . Mood disorder   . Pulmonary embolism   . Avascular necrosis   . Leukocytosis     Chronic  . Thrombocytosis     Chronic  . Hypertension     Past Surgical History  Procedure Date  . Right hip replacement     08/2006  . Cholecystectomy     01/2008  . Porta cath placement   . Porta cath removal   . Umbilical hernia repair     01/2008  . Excision of left periauricular cyst     10/2009  . Excision of right ear lobe cyst with primary closur     11/2007  . Portacath placement 01/05/2012    Procedure: INSERTION PORT-A-CATH;  Surgeon: Adolph Pollack, MD;  Location: [facility] OR;  Service: General;  Laterality: N/A;  ultrasound guiced port a cath insertion with fluoroscopy    Family History  Problem Relation Age of Onset  . Sickle cell anemia Mother    . Sickle cell anemia Father   . Sickle cell trait Brother     History  Substance Use Topics  . Smoking status: Former Smoker -- 13 years    Types: Cigarettes    Quit date: 07/08/2010  . Smokeless tobacco: Never Used  . Alcohol Use: No      Review of Systems  Constitutional:       Per HPI, otherwise negative  HENT:       Per HPI, otherwise negative  Eyes: Negative.   Respiratory:       Per HPI, otherwise negative  Cardiovascular:       Per HPI, otherwise negative  Gastrointestinal: Negative for vomiting.  Genitourinary: Negative.   Musculoskeletal:       Per HPI, otherwise negative  Skin: Negative.   Neurological: Negative for syncope.    Allergies  Morphine and related  Home Medications   Current Outpatient Rx  Name  Route  Sig  Dispense  Refill  . FOLIC ACID 1 MG PO TABS   Oral   Take 1 tablet (1 mg total) by mouth daily.   100 tablet   2   . HYDROMORPHONE HCL 4 MG PO TABS   Oral   Take 1-2 tablets (4-8 mg total) by mouth every 4 (four) hours as needed for pain. For pain.  60 tablet   0   . HYDROXYUREA 500 MG PO CAPS   Oral   Take 1 capsule (500 mg total) by mouth 2 (two) times daily. May take with food to minimize GI side effects.   60 capsule   2   . IBUPROFEN 800 MG PO TABS   Oral   Take 1 tablet (800 mg total) by mouth every 8 (eight) hours as needed for pain. Do not exceed use for more than 20/30 days   30 tablet   0   . LISINOPRIL 10 MG PO TABS   Oral   Take 1 tablet (10 mg total) by mouth daily at 10 pm.   30 tablet   2   . OLANZAPINE 10 MG PO TABS   Oral   Take 10 mg by mouth at bedtime.         . OXYCODONE HCL ER 80 MG PO TB12   Oral   Take 1 tablet (80 mg total) by mouth every 12 (twelve) hours.   30 tablet   0   . POLYETHYLENE GLYCOL 3350 PO PACK   Oral   Take 17 g by mouth at bedtime.   14 each   0   . POTASSIUM CHLORIDE CRYS ER 20 MEQ PO TBCR   Oral   Take 1 tablet (20 mEq total) by mouth 2 (two) times daily.    60 tablet   1   . SENNOSIDES-DOCUSATE SODIUM 8.6-50 MG PO TABS   Oral   Take 1 tablet by mouth 2 (two) times daily.   60 tablet   0   . ZOLPIDEM TARTRATE 10 MG PO TABS   Oral   Take 1 tablet (10 mg total) by mouth at bedtime as needed. For sleep   30 tablet   2     BP 96/55  Pulse 73  Temp 98.8 F (37.1 C) (Oral)  Resp 16  SpO2 96%  Physical Exam  Nursing note and vitals reviewed. Constitutional: He is oriented to person, place, and time. He appears well-developed. No distress.  HENT:  Head: Normocephalic and atraumatic.  Eyes: Conjunctivae normal and EOM are normal.  Cardiovascular: Normal rate and regular rhythm.   Pulmonary/Chest: Effort normal. No stridor. No respiratory distress.       Port in place no gross defect  Abdominal: He exhibits no distension.  Musculoskeletal: He exhibits no edema.  Neurological: He is alert and oriented to person, place, and time.  Skin: Skin is warm and dry.  Psychiatric: He has a normal mood and affect.    ED Course  Procedures (including critical care time)  Immediately after the initial encounter I reviewed the patient's chart, including labs drawn earlier today - largely reassuring.    Labs Reviewed - No data to display Dg Chest 2 View  03/01/2012  *RADIOLOGY REPORT*  Clinical Data: Sickle cell crisis  CHEST - 2 VIEW  Comparison: January 14, 2012  Findings:  Port-A-Cath tip is at the superior vena cava - right atrium junction.  No pneumothorax.  There is stable mild scarring in the lung bases.  Lungs are otherwise clear.  Heart is mildly enlarged with the a mild degree of pulmonary venous hypertension which is stable.  No adenopathy. No bone lesions are appreciated.  IMPRESSION: There is evidence of mild cardiac enlargement with mild pulmonary venous hypertension.  A degree of volume overload/high output state is suspected, consistent with known sickle cell disease.  There is no frank edema or  consolidation.  Mild lung scarring  is stable.   Original Report Authenticated By: Bretta Bang, M.D.      No diagnosis found.  Pulse ox 95% room air at rest, borderline  Update: Patient continues to c/o pain. Still about the lower ribs  12:24 PM I discussed the patient's CAT scan results with our radiologist.  Anticoagulation ordered.  The patient will be admitted for further evaluation and management.  12:46 PM I discussed the patient's case with the admitting team.    MDM  This young male with sickle cell disease now presents with ongoing chest pain, and new dyspnea.  The patient is hypoxic with exertion, and given his chest pain, there is a suspicion of PE.  This was demonstrated via CAT scan.  The patient subsequently received anti-coagulation.  The patient remained in no distress, while resting in the emergency department and was admitted for further evaluation and management.  CRITICAL CARE Performed by: Gerhard Munch   Total critical care time: 35  Critical care time was exclusive of separately billable procedures and treating other patients.  Critical care was necessary to treat or prevent imminent or life-threatening deterioration.  Critical care was time spent personally by me on the following activities: development of treatment plan with patient and/or surrogate as well as nursing, discussions with consultants, evaluation of patient's response to treatment, examination of patient, obtaining history from patient or surrogate, ordering and performing treatments and interventions, ordering and review of laboratory studies, ordering and review of radiographic studies, pulse oximetry and re-evaluation of patient's condition.       Gerhard Munch, MD 03/01/12 1247

## 2012-03-01 NOTE — H&P (Addendum)
[facility] History and Physical  [name] [name] DOB: [birth year] DOA: 03/01/2012  Referring physician: Gerhard Munch, MD. PCP: [name] [name], MD   Chief Complaint: Shortness of breath   History of Present Illness: [name] is an 32 y.o. 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 He was found to be hypoxic with oxygen saturation levels of 85% with minimal exertion and therefore was sent to the emergency department for further evaluation. A chest radiograph followed by a CT scan of the chest was done which confirmed pulmonary embolism. He subsequently was referred to the hospitalist service for further evaluation and treatment. Patient currently reports that he is not feeling short of breath. He denies chest pain and heart palpitations.  Review of Systems: Constitutional: No fever, no chills;  Appetite normal; No weight loss, no weight gain.  HEENT: No blurry vision, no diplopia, no pharyngitis, no dysphagia CV: No chest pain, no palpitations.  Resp: + SOB, no cough. GI: No nausea, no vomiting, no diarrhea, no melena, no hematochezia.  GU: No dysuria, no hematuria.  MSK: +myalgias, + arthralgias.  Neuro:  No headache, no focal neurological deficits, no history of seizures.  Psych: No depression, no anxiety.  Endo: No thyroid disease, no DM, no heat intolerance, no cold intolerance, no polyuria, no polydipsia  Skin: No rashes, no skin lesions.  Heme: No easy bruising, no history of blood diseases.  Past Medical History Past Medical History  Diagnosis Date  . Sickle cell anemia   . Blood transfusion   . 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 Hypokalemia   . Mood disorder   . Pulmonary embolism   . Avascular necrosis   .  Leukocytosis     Chronic  . Thrombocytosis     Chronic  . Hypertension      Past Surgical History Past Surgical History  Procedure Date  . Right hip replacement     08/2006  . Cholecystectomy     01/2008  . Porta cath placement   . Porta cath removal   . Umbilical hernia repair     01/2008  . Excision of left periauricular cyst     10/2009  . Excision of right ear lobe cyst with primary closur     11/2007  . Portacath placement 01/05/2012    Procedure: INSERTION PORT-A-CATH;  Surgeon: Adolph Pollack, MD;  Location: [facility] OR;  Service: General;  Laterality: N/A;  ultrasound guiced port a cath insertion with fluoroscopy     Social History: History   Social History  . Marital Status: Single    Spouse Name: N/A    Number of Children: 0  . Years of Education: 13   Occupational History  . Unemployed    Social History Main Topics  . Smoking status: Former Smoker -- 13 years    Types: Cigarettes    Quit date: 07/08/2010  . Smokeless tobacco: Never Used  . Alcohol Use: No  . Drug Use: No     Comment: quit smoking 2011  . Sexually Active: Yes    Birth Control/ Protection: None   Other Topics Concern  . Not on file   Social History Narrative   Single.  Lives with girlfriend.      Family History:  Family History  Problem Relation Age of Onset  . Sickle cell anemia Mother   . Sickle cell anemia Father   . Sickle cell trait Brother     Allergies: Morphine and related  Meds: Prior to Admission medications   Medication Sig Start Date End Date Taking? Authorizing Provider  ibuprofen (ADVIL,MOTRIN) 800 MG tablet Take 1 tablet (800 mg total) by mouth every 8 (eight) hours as needed for pain. Do not exceed use for more than 20/30 days 01/23/12  Yes Altha Harm, MD  folic acid (FOLVITE) 1 MG tablet Take 1 tablet (1 mg total) by mouth daily. 01/04/12   Gwenyth Bender, MD  HYDROmorphone (DILAUDID) 4 MG tablet Take 1-2 tablets (4-8 mg total) by mouth every 4 (four)  hours as needed for pain. For pain. 02/09/12   Grayce Sessions, NP  hydroxyurea (HYDREA) 500 MG capsule Take 1 capsule (500 mg total) by mouth 2 (two) times daily. May take with food to minimize GI side effects. 01/04/12   Gwenyth Bender, MD  lisinopril (PRINIVIL,ZESTRIL) 10 MG tablet Take 1 tablet (10 mg total) by mouth daily at 10 pm. 01/04/12   Gwenyth Bender, MD  OLANZapine (ZYPREXA) 10 MG tablet Take 10 mg by mouth at bedtime. 11/08/11   Gwenyth Bender, MD  oxyCODONE (OXYCONTIN) 80 MG 12 hr tablet Take 1 tablet (80 mg total) by mouth every 12 (twelve) hours. 02/09/12   Grayce Sessions, NP  polyethylene glycol (MIRALAX / GLYCOLAX) packet Take 17 g by mouth at bedtime. 01/23/12   Altha Harm, MD  potassium chloride SA (K-DUR,KLOR-CON) 20 MEQ tablet Take 1 tablet (20 mEq total) by mouth 2 (two) times daily. 01/04/12   Gwenyth Bender, MD  senna-docusate (SENOKOT-S) 8.6-50 MG per tablet Take 1 tablet by mouth 2 (two) times daily. 01/23/12   Altha Harm, MD  zolpidem (AMBIEN) 10 MG tablet Take 1 tablet (10 mg total) by mouth at bedtime as needed. For sleep 02/09/12   Grayce Sessions, NP    Physical Exam: Filed Vitals:   03/01/12 1109 03/01/12 1226 03/01/12 1427  BP: 96/55  123/87  Pulse: 73  83  Temp: 98.8 F (37.1 C)  97.8 F (36.6 C)  TempSrc: Oral    Resp: 16  18  Height:  6' (1.829 m) 6' (1.829 m)  Weight:  79.379 kg (175 lb) 76.522 kg (168 lb 11.2 oz)  SpO2: 96%  95%     Physical Exam: Blood pressure 123/87, pulse 83, temperature 97.8 F (36.6 C), temperature source Oral, resp. rate 18, height 6' (1.829 m), weight 76.522 kg (168 lb 11.2 oz), SpO2 95.00%. Gen: No acute distress. Head: Normocephalic, atraumatic. Eyes: PERRL, EOMI, sclerae icteric. Mouth: Oropharynx clear. No exudates. Neck: Supple, no thyromegaly, no lymphadenopathy, no jugular venous distention. Chest: Lungs diminished at the bases but otherwise clear. CV: Heart sounds are regular with an occasional skipped  beat. Abdomen: Soft, nontender, nondistended with normal active bowel sounds. Extremities: Extremities are without clubbing, edema, or cyanosis. Skin: Warm and dry. Neuro: Alert and oriented times 3; cranial nerves II through XII grossly intact. Psych: Mood and affect normal.  Labs on Admission:  Basic Metabolic Panel:  Lab 03/01/12 9604 02/29/12 1528  NA 136 136  K 3.3* 3.2*  CL 103 102  CO2 23 21  GLUCOSE 125* 109*  BUN 6 5*  CREATININE 0.68 0.53  CALCIUM 8.7 9.0  MG 1.8 --  PHOS -- --  Liver Function Tests:  Lab 03/01/12 0529 02/29/12 1528  AST 28 32  ALT 15 20  ALKPHOS 68 75  BILITOT 2.7* 4.0*  PROT 7.0 7.9  ALBUMIN 3.7 4.0   CBC:  Lab 03/01/12 0529 02/29/12 1528  WBC 16.2* 17.2*  NEUTROABS 9.5* 12.9*  HGB 7.1* 7.6*  HCT 20.7* 22.4*  MCV 91.6 91.8  PLT 485* 534*   BNP (last 3 results)  Basename 07/18/11 1500 07/05/11 0620 06/07/11 0650  PROBNP 13.5 219.1* 7.4    Radiological Exams on Admission: Dg Chest 2 View  03/01/2012  *RADIOLOGY REPORT*  Clinical Data: Sickle cell crisis  CHEST - 2 VIEW  Comparison: January 14, 2012  Findings:  Port-A-Cath tip is at the superior vena cava - right atrium junction.  No pneumothorax.  There is stable mild scarring in the lung bases.  Lungs are otherwise clear.  Heart is mildly enlarged with the a mild degree of pulmonary venous hypertension which is stable.  No adenopathy. No bone lesions are appreciated.  IMPRESSION: There is evidence of mild cardiac enlargement with mild pulmonary venous hypertension.  A degree of volume overload/high output state is suspected, consistent with known sickle cell disease.  There is no frank edema or consolidation.  Mild lung scarring is stable.   Original Report Authenticated By: Bretta Bang, M.D.    Ct Angio Chest Pe W/cm &/or Wo Cm  03/01/2012  *RADIOLOGY REPORT*  Clinical Data: Shortness of breath.  Evaluate for pulmonary embolism.  History of sickle cell disease.  CT ANGIOGRAPHY  CHEST  Technique:  Multidetector CT imaging of the chest using the standard protocol during bolus administration of intravenous contrast. Multiplanar reconstructed images including MIPs were obtained and reviewed to evaluate the vascular anatomy.  Contrast: OMNIPAQUE IOHEXOL 350 MG/ML SOLN  Comparison: Chest CT 11/21/2011.  Findings:  Mediastinum: There are multiple small segmental and subsegmental sized filling defects within the pulmonary arteries to the lower lobes of the lungs bilaterally.  No larger central or lobar sized filling defects are noted at this time.  These emboli appear to be nonocclusive at this time.  The heart is moderately enlarged without definite signs of right-sided heart strain at this time. Left subclavian single lumen power Port-A-Cath with tip terminating in the right atrium. There is no significant pericardial fluid, thickening or pericardial calcification.  Mildly enlarged right hilar lymph node (1.5 cm in diameter), similar to prior.  Numerous reactive size mediastinal and left hilar lymph nodes are noted. Esophagus is unremarkable in appearance.  Lungs/Pleura: Extensive thickening of the peribronchovascular interstitium and septal thickening throughout the lower lungs bilaterally, with multiple areas of mild architectural distortion. The overall appearance is very similar to the prior examination, most compatible with chronic areas of scarring.  No definite suspicious appearing pulmonary nodules or masses.  No definite acute consolidative air space disease.  No pleural effusions.  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  Cortical thinning in the posterior aspect of the upper pole of the left kidney, suggesting scarring.  Musculoskeletal: Multifocal sclerosis in the bones related to areas of avascular necrosis.  H shaped vertebral bodies compatible with the diagnosis of sickle cell disease.  There are no aggressive appearing lytic or blastic lesions noted in the visualized portions of the skeleton.  IMPRESSION: 1.  Study is positive for segmental and subsegmental sized emboli to the lower lobes of the lungs bilaterally.  2.  Extensive chronic scarring  throughout the lower lungs, similar to prior studies. 3.  Cardiomegaly. 4.  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 5.  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  Critical Value/emergent results were called by telephone at the time of interpretation on 03/01/2012  at 12:20 p.m. to Dr. Jeraldine Loots, who verbally acknowledged these results.   Original Report Authenticated By: Trudie Reed, M.D.     EKG: Independently reviewed. Premature ventricular contractions noted. T wave inversions in the anterior leads and Q waves noted in the lateral leads.  Assessment/Plan Principal Problem:  *Pulmonary embolism with hypoxia  Admit and place on empiric Lovenox and Coumadin.  We'll have RN teach the patient how to self administer Lovenox. Active Problems:  Leukocytosis  Chronic. Likely due to chronic inflammation.  Hypokalemia  We'll give 40 mEq of oral replacement therapy now and continue his usual 20 mEq dose twice a day.  Thrombocytosis  Chronic. Likely reactive.  Sickle cell anemia  Continue usual home pain medications. No current indication for blood transfusion.  Code Status: Full. Family Communication: None at bedside. Disposition Plan: Home when stable.  Time spent: 50 minutes.  RAMA,CHRISTINA [facility] Pager [phone]  If 7PM-7AM, please contact night-coverage [email or web address] Password [facility] 03/01/2012, 3:43 PM

## 2012-03-01 NOTE — ED Notes (Signed)
desats to 88% with activity

## 2012-03-01 NOTE — Telephone Encounter (Signed)
See phone call comment

## 2012-03-01 NOTE — Progress Notes (Signed)
Patient ID: [name], male   DOB: 07/29/1980, 32 y.o.   MRN: [id number] [name] states that his pain and dizziness have both improved this morning. However he still feels that he is having some episodes of SOB. He is willing to submit to an ambulatory pulse-ox this morning as well as orthostatic vitals as his pain is improved.  O: Focused Examination: Gen: Pt well appearing. In NAD. HEENT: Anicteric. PEERLA, EOMI Lungs: CTA. No wheezing, rales or rhonchi COR: S1, S2, normal. No murmurs, rubs or gallops Abdomen: BS normal.  Ext: No C/C/E. No calf tenderness  A/P: 1. Acute VOC: Continue bolus doses of analgesics, Toradol and Oxycontin.  2. DOE: Will check ambulatory pulse oximetry. If pt hypoxic, will refer to ED for further evaluation.  Discussed with NP [name].

## 2012-03-01 NOTE — Progress Notes (Signed)
Orthostatic VS:  Lying- temp 98.1 BP 107/61 HR 58 RR 18 pulsox 98%  Sitting- BP 107/72 HR 64 HR 64 RR 18 pulsox 100%  Standing- BP 97/65 HR 73 RR 18 pulsox 98%   pulsox sustained 95%, pulsox 85% after ambulating 50 feet. When returning to standing position, pulsox returned to sustained 98% Pt ambulated approximately 60 feet total  VS reported to Joslin, NP

## 2012-03-01 NOTE — Progress Notes (Signed)
Subjective: [name] seen today had significant complaints of shortness of breath and dizziness in which he felt somewhat better. He had orthostatic Bp taken no significant changes but O2 sats dropped to 85% with exertion he actually walked 50 feet. Significant PMH of PE he was also evaluated by Dr. Ashley Royalty and felt further evaluation was needed and transferred to the ED.   Objective: Vital signs in last 24 hours: Filed Vitals:   03/01/12 0903 03/01/12 0904 03/01/12 0905 03/01/12 0906  BP: 103/72 97/65    Pulse:      Temp:      TempSrc:      Resp: 18 18 20    SpO2: 100% 98% 95% 85%   Weight change:   Intake/Output Summary (Last 24 hours) at 03/01/12 0958 Last data filed at 02/29/12 1755  Gross per 24 hour  Intake 436.67 ml  Output      0 ml  Net 436.67 ml    Physical Exam: Orthostatic Vital Signs:  Lying- temp 98.1 BP 107/61 HR 58 RR 18 pulsox 98%  Sitting- BP 107/72 HR 64 HR 64 RR 18 pulsox 100%  Standing- BP 97/65 HR 73 RR 18 pulsox 98%  pulsox sustained 95%, pulsox 85% after ambulating 50 feet. When returning to standing position, pulsox returned to sustained 98 Temperature: 98.1,    General Appearance: Alert, Ox3, pleasant and cooperative Head: Normocephalic, atraumatic  Eyes: PERRL, slight strabismus, mild-mod icteric sclera  Back: Symmetric, no CVA tenderness  Resp: even and nonlabored, diminished bilateral bases, no vocal fremitus; no wheezes/rales/rhonchi  Cardio: S1, S2 regular, no murmurs/clicks/rubs/gallops porta cath upper right chest  GI: Soft,, Non distended , + bowel sounds, no masses or organomegaly appreciated, bilateral flank pain guarding during exam    Male Genitalia: Deferred; denies priapism  Extremities: Extremities normal, atraumatic, no cyanosis or edema, negative Homans Pulses: 2+ and symmetric  Skin: Skin intact numerous  tatoos Neurologic: Grossly intact, no focal deficits  Psych: Appropriate affect  Lab Results:  Basename 03/01/12 0529  02/29/12 1528  NA 136 136  K 3.3* 3.2*  CL 103 102  CO2 23 21  GLUCOSE 125* 109*  BUN 6 5*  CREATININE 0.68 0.53  CALCIUM 8.7 9.0  MG 1.8 --  PHOS -- --    Basename 03/01/12 0529 02/29/12 1528  AST 28 32  ALT 15 20  ALKPHOS 68 75  BILITOT 2.7* 4.0*  PROT 7.0 7.9  ALBUMIN 3.7 4.0   No results found for this basename: LIPASE:2,AMYLASE:2 in the last 72 hours  Basename 03/01/12 0529 02/29/12 1528  WBC 16.2* 17.2*  NEUTROABS 9.5* 12.9*  HGB 7.1* 7.6*  HCT 20.7* 22.4*  MCV 91.6 91.8  PLT 485* 534*   No results found for this basename: CKTOTAL:3,CKMB:3,CKMBINDEX:3,TROPONINI:3 in the last 72 hours No components found with this basename: POCBNP:3 No results found for this basename: DDIMER:2 in the last 72 hours No results found for this basename: HGBA1C:2 in the last 72 hours No results found for this basename: CHOL:2,HDL:2,LDLCALC:2,TRIG:2,CHOLHDL:2,LDLDIRECT:2 in the last 72 hours No results found for this basename: TSH,T4TOTAL,FREET3,T3FREE,THYROIDAB in the last 72 hours  Basename 02/29/12 1528  VITAMINB12 --  FOLATE --  FERRITIN --  TIBC --  IRON --  RETICCTPCT 19.6*    Micro Results: No results found for this or any previous visit (from the past 240 hour(s)).  Studies/Results: No results found.  Medications:} Scheduled Meds:   . folic acid  1 mg Oral Daily  . hydroxyurea  500 mg Oral  BID  . ketorolac  30 mg Intravenous Q6H  . lisinopril  10 mg Oral Q2200  . OLANZapine  10 mg Oral QHS  . OxyCODONE  80 mg Oral Q12H  . polyethylene glycol  17 g Oral QHS  . potassium chloride  40 mEq Oral Q2H  . senna-docusate  1 tablet Oral BID   Continuous Infusions:   . dextrose 5 % and 0.45% NaCl 125 mL/hr at 03/01/12 0247   PRN Meds:.diphenhydrAMINE, diphenhydrAMINE, HYDROmorphone (DILAUDID) injection, ondansetron (ZOFRAN) IV, ondansetron, zolpidem  Assessment/Plan: Active Problems:  Hypoxia/ hx of PE with exertion: transferred to ED for further evaluation Dr.  Meryle Ready  Accepting physician  Vaso occlusive crisis: with active hemolysis receiving IV hydration, diphenhydramine for  diphenhydrAMINE, HYDROmorphone (DILAUDID) injection, ondansetron (ZOFRAN) IV, ondansetron, zolpidem Hypokalemia: replacing with oral supplements     LOS: 1 day  [name] P 03/01/2012, 9:58 AM

## 2012-03-01 NOTE — ED Notes (Signed)
[id number]<YN> Expected date:<BR> Expected time:<BR> Means of arrival:<BR> Comments:<BR> Sickle cell transfer

## 2012-03-01 NOTE — ED Notes (Signed)
Off floor for testing 

## 2012-03-01 NOTE — Progress Notes (Signed)
Patient ID: [name], male   DOB: 12-26-1980, 32 y.o.   MRN: [id number] Pt Transferred to ED Per NP order. Patient escorted by Nursing staff Via wheelchair. Patient in no apparent distress at this time. Report given to RN on arrival to ED Resus Room Bed B.

## 2012-03-01 NOTE — ED Notes (Signed)
Per RN/pt desat with activity-history of PE-at sickle cell clinic since yesterday for flank pain

## 2012-03-01 NOTE — Progress Notes (Signed)
In light of decreased saturations with ambulation, I am concerned about the possibility of PE. I have recommended ER evaluation for R/O PE. Pt is being transferred to the ED for evaluation.

## 2012-03-01 NOTE — Progress Notes (Signed)
ANTICOAGULATION CONSULT NOTE - Initial Consult  Pharmacy Consult for Lovenox/Warfarin Indication: New PE  Allergies  Allergen Reactions  . Morphine And Related Hives and Rash    Pt states he is able to tolerate Dilaudid with no reactions.    Patient Measurements: Height: 6' (182.9 cm) Weight: 175 lb (79.379 kg) IBW/kg (Calculated) : 77.6    Vital Signs: Temp: 98.8 F (37.1 C) (01/30 1109) Temp src: Oral (01/30 1109) BP: 96/55 mmHg (01/30 1109) Pulse Rate: 73  (01/30 1109)  Labs:  Basename 03/01/12 0529 02/29/12 1528  HGB 7.1* 7.6*  HCT 20.7* 22.4*  PLT 485* 534*  APTT -- --  LABPROT -- --  INR -- --  HEPARINUNFRC -- --  CREATININE 0.68 0.53  CKTOTAL -- --  CKMB -- --  TROPONINI -- --    Estimated Creatinine Clearance: 145.5 ml/min (by C-G formula based on Cr of 0.68).   Medical History: Past Medical History  Diagnosis Date  . Sickle cell anemia   . Blood transfusion   . 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 Hypokalemia   . Mood disorder   . Pulmonary embolism   . Avascular necrosis   . Leukocytosis     Chronic  . Thrombocytosis     Chronic  . Hypertension     Medications:  Scheduled:    . sodium chloride   Intravenous STAT  . [COMPLETED] enoxaparin  1 mg/kg Subcutaneous Once  . enoxaparin (LOVENOX) injection  1 mg/kg Subcutaneous Q12H  . [COMPLETED]  HYDROmorphone (DILAUDID) injection  1 mg Intravenous Once  . [COMPLETED] sodium chloride  1,000 mL Intravenous Once   Infusions:   PRN: HYDROmorphone (DILAUDID) injection, [COMPLETED] iohexol, [DISCONTINUED]  HYDROmorphone (DILAUDID) injection  Assessment:  32 yo M being with SCD, was being treated in sickle cell clinic and then transferred to ER with CC ongoing CP and new dyspnea on exertion.  Found to have PE on CT/Angio and is to start D#1/5 warfarin/Lovenox overlap.  Patient has a reports a history of thromboembolism, not on any anticoagulation prior to admission.  Baseline PT/INR ordered  Hgb 7.1, likely low from SCD, will monitor.  No S/Sx bleeding reported   Scr is wnl with CrCl > 100 ml/min   Patient will need warfarin/lovenox overlap for at least 5 days and until INR is therapeutic x 24 hours.   Goal of Therapy:  INR 2-3 Monitor platelets by anticoagulation protocol: Yes Lovenox per renal function, levels if needed   Plan:  1.) Lovenox 80 mg SQ Q12h, next dose due at 2200 as patient received dose in ER 2.) Warfarin 10 mg at 1800, Pharmacy will f/u with baseline PT/INR prior to administration of dose 3.) Warfarin book/video/education  4.) Daily PT/INR, CBC Q72h   Eddi Hymes, Loma Messing PharmD Pager #: [phone] 1:49 PM 03/01/2012

## 2012-03-01 NOTE — H&P (Signed)
Spoke with patient and examined him. He states that he has been having some DOE in the last week since discharge from hospital. I asked for an ambulatory pulse-ox but patient refused stating that he was in too much pain. I informed patient of the concern for his DOE however he states that it has improved. He also states that the dizziness has improved since receiving fluids. Will re-evaluate DOE in the morning and get morning orthostatics. Also discussed with RN. Desiree Hill.

## 2012-03-01 NOTE — ED Notes (Signed)
Report given to Onalee Hua, RN-transfer to 3rd floor

## 2012-03-02 DIAGNOSIS — Z96649 Presence of unspecified artificial hip joint: Secondary | ICD-10-CM | POA: Diagnosis not present

## 2012-03-02 DIAGNOSIS — I2699 Other pulmonary embolism without acute cor pulmonale: Secondary | ICD-10-CM | POA: Diagnosis not present

## 2012-03-02 DIAGNOSIS — D5700 Hb-SS disease with crisis, unspecified: Secondary | ICD-10-CM | POA: Diagnosis not present

## 2012-03-02 DIAGNOSIS — J9600 Acute respiratory failure, unspecified whether with hypoxia or hypercapnia: Secondary | ICD-10-CM | POA: Diagnosis not present

## 2012-03-02 DIAGNOSIS — I10 Essential (primary) hypertension: Secondary | ICD-10-CM | POA: Diagnosis not present

## 2012-03-02 DIAGNOSIS — D473 Essential (hemorrhagic) thrombocythemia: Secondary | ICD-10-CM | POA: Diagnosis not present

## 2012-03-02 LAB — MRSA PCR SCREENING: MRSA by PCR: NEGATIVE

## 2012-03-02 MED ORDER — HYDROMORPHONE HCL PF 1 MG/ML IJ SOLN
1.0000 mg | INTRAMUSCULAR | Status: DC | PRN
  Administered 2012-03-02 – 2012-03-04 (×11): 2 mg via INTRAVENOUS
  Filled 2012-03-02 (×11): qty 2

## 2012-03-02 MED ORDER — WARFARIN SODIUM 10 MG PO TABS
10.0000 mg | ORAL_TABLET | Freq: Once | ORAL | Status: AC
  Administered 2012-03-02: 10 mg via ORAL
  Filled 2012-03-02: qty 1

## 2012-03-02 NOTE — Progress Notes (Signed)
ANTICOAGULATION CONSULT NOTE - Initial Consult  Pharmacy Consult for Lovenox/Warfarin Indication: New bilateral PE  Allergies  Allergen Reactions  . Morphine And Related Hives and Rash    Pt states he is able to tolerate Dilaudid with no reactions.    Patient Measurements: Height: 6' (182.9 cm) Weight: 169 lb (76.658 kg) IBW/kg (Calculated) : 77.6    Vital Signs: Temp: 98.2 F (36.8 C) (01/31 1011) Temp src: Oral (01/31 0614) BP: 118/71 mmHg (01/31 1011) Pulse Rate: 77  (01/31 1011)  Labs:  Basename 03/02/12 0515 03/01/12 1421 03/01/12 0529 02/29/12 1528  HGB -- -- 7.1* 7.6*  HCT -- -- 20.7* 22.4*  PLT -- -- 485* 534*  APTT -- -- -- --  LABPROT 15.3* 16.9* -- --  INR 1.23 1.41 -- --  HEPARINUNFRC -- -- -- --  CREATININE -- -- 0.68 0.53  CKTOTAL -- -- -- --  CKMB -- -- -- --  TROPONINI -- -- -- --    Estimated Creatinine Clearance: 143.8 ml/min (by C-G formula based on Cr of 0.68).   Medical History: Past Medical History  Diagnosis Date  . Sickle cell anemia   . Blood transfusion   . 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 Hypokalemia   . Mood disorder   . Pulmonary embolism   . Avascular necrosis   . Leukocytosis     Chronic  . Thrombocytosis     Chronic  . Hypertension     Medications:  Scheduled:     . [COMPLETED] sodium chloride   Intravenous STAT  . [COMPLETED] coumadin book   Does not apply Once  . enoxaparin (LOVENOX) injection  1 mg/kg Subcutaneous Q12H  . folic acid  1 mg Oral Daily  . hydroxyurea  500 mg Oral BID  . lisinopril  10 mg Oral Q2200  . OLANZapine  10 mg Oral QHS  . OxyCODONE  80 mg Oral Q12H  . polyethylene glycol  17 g Oral QHS  . potassium chloride SA  20 mEq Oral BID  . [COMPLETED] potassium chloride  40 mEq Oral Once  . senna-docusate  1 tablet Oral BID  . [COMPLETED] warfarin  10 mg Oral ONCE-1800  . [COMPLETED] warfarin   Does not apply Once  . Warfarin - Pharmacist Dosing Inpatient    Does not apply q1800  . [DISCONTINUED] potassium chloride SA  20 mEq Oral BID   Infusions:   PRN: diphenhydrAMINE, HYDROmorphone (DILAUDID) injection, HYDROmorphone, ibuprofen, ondansetron (ZOFRAN) IV, ondansetron, zolpidem, [DISCONTINUED]  HYDROmorphone (DILAUDID) injection  Assessment:  32 yo M being with SCD, was being treated in sickle cell clinic and then transferred to ER with CC ongoing CP and new dyspnea on exertion.  Found to have bilateral PE on CT/Angio and is on D#2/5 minimum warfarin/Lovenox overlap.  Patient reports a history of thromboembolism, not on any anticoagulation prior to admission.   Hgb 7.1, likely low from SCD, will monitor.  No S/Sx bleeding reported   Scr is wnl with CrCl > 100 ml/min   Patient will need warfarin/lovenox overlap for at least 5 days and until INR is therapeutic x 24 hours.   Warfarin education was completed on 1/31 -  Patient demonstrated understanding and questions were answered.   Goal of Therapy:  INR 2-3 Monitor platelets by anticoagulation protocol: Yes Lovenox per renal function, levels if needed   Plan:  1.) Continue Lovenox 80 mg SQ Q12h 2.) Warfarin 10 mg at 1800 4.) Daily PT/INR, CBC  Q72h   BorgerdingLoma Messing PharmD Pager #: [phone] 1:36 PM 03/02/2012

## 2012-03-02 NOTE — Progress Notes (Signed)
SICKLE CELL SERVICE PROGRESS NOTE  [name] JYN:[id number] DOB: 04/01/1980 DOA: 03/01/2012 PCP: [name] [name], MD  Assessment/Plan:  Pulmonary embolism with hypoxia: Lovenox and Coumadin have been initiated. Pt will need overlap until therapeutic x 48 hours. He will likely need to be on life long therapy as this is the second PE. Will review Lovenox teaching with patient.   Leukocytosis: Likely related to PE. Will recheck in morning.    Hypokalemia: replaced orally recheck in morning.    Thrombocytosis: related to resolving sickle cell crisis.   Sickle cell anemia: Hemoglobin stable.   Sickle Cell Anemia with Crisis: Pt has a resolving crisis and is almost at baseline. Continue current home regimen and IV for severe pain.  Code Status: Full Code Family Communication: N/A Disposition Plan: Home in 48-72 hours.  [name],[name]  [facility] Pager [phone]. If 7PM-7AM, please contact night-coverage at [email or web address], password [facility] 03/02/2012, 12:18 PM  LOS: 1 day   Brief narrative: [name] is an 32 y.o. 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 He was found to be hypoxic with oxygen saturation levels of 85% with minimal exertion and therefore was sent to the emergency department for further evaluation. A chest radiograph followed by a CT scan of the chest was done which confirmed pulmonary embolism. He subsequently was referred to the hospitalist service for further evaluation and treatment. Patient currently reports that he is not feeling short of breath. He denies chest pain and heart palpitations   Consultants:  None  Procedures:  None  Antibiotics:  None  HPI/Subjective: Pt in good spirits and states that he has no dizziness today. He also  states that pain is well controlled. He states that she is competent in administering Lovenox as he's done it before.  Objective: Filed Vitals:   03/01/12 2150 03/02/12 0200 03/02/12 0614 03/02/12 1011  BP: 109/64 105/66 97/61 118/71  Pulse: 73 78 67 77  Temp: 98.7 F (37.1 C) 98.1 F (36.7 C) 98.2 F (36.8 C) 98.2 F (36.8 C)  TempSrc: Oral Oral Oral   Resp: 18 16 16 18   Height:      Weight:   76.658 kg (169 lb)   SpO2: 98% 98% 98% 96%   Weight change:   Intake/Output Summary (Last 24 hours) at 03/02/12 1218 Last data filed at 03/02/12 0900  Gross per 24 hour  Intake    720 ml  Output      0 ml  Net    720 ml    General: Alert, awake, oriented x3, in no acute distress.  HEENT: [place]/AT PEERL, EOMI, anicteric Heart: Regular rate and rhythm, without murmurs, rubs, gallops.  Lungs: Clear to auscultation, no wheezing or rhonchi noted.   Abdomen: Soft, nontender, nondistended, positive bowel sounds, no masses no hepatosplenomegaly noted..  Neuro: No focal neurological deficits noted cranial nerves II through XII grossly intact. DTRs 2+ bilaterally upper and lower extremities. Strength 5 out of 5 in bilateral upper and lower extremities. Musculoskeletal: No warm swelling or erythema around joints, no spinal tenderness noted. .   Data Reviewed: Basic Metabolic Panel:  Lab 03/01/12 9629 02/29/12 1528  NA 136 136  K 3.3* 3.2*  CL 103 102  CO2 23 21  GLUCOSE 125* 109*  BUN 6 5*  CREATININE 0.68  0.53  CALCIUM 8.7 9.0  MG 1.8 --  PHOS -- --   Liver Function Tests:  Lab 03/01/12 0529 02/29/12 1528  AST 28 32  ALT 15 20  ALKPHOS 68 75  BILITOT 2.7* 4.0*  PROT 7.0 7.9  ALBUMIN 3.7 4.0   No results found for this basename: LIPASE:5,AMYLASE:5 in the last 168 hours No results found for this basename: AMMONIA:5 in the last 168 hours CBC:  Lab 03/01/12 0529 02/29/12 1528  WBC 16.2* 17.2*  NEUTROABS 9.5* 12.9*  HGB 7.1* 7.6*  HCT 20.7* 22.4*  MCV 91.6 91.8  PLT 485*  534*   Cardiac Enzymes: No results found for this basename: CKTOTAL:5,CKMB:5,CKMBINDEX:5,TROPONINI:5 in the last 168 hours BNP (last 3 results)  Basename 07/18/11 1500 07/05/11 0620 06/07/11 0650  PROBNP 13.5 219.1* 7.4   CBG: No results found for this basename: GLUCAP:5 in the last 168 hours  Recent Results (from the past 240 hour(s))  MRSA PCR SCREENING     Status: Normal   Collection Time   03/01/12  6:12 PM      Component Value Range Status Comment   MRSA by PCR NEGATIVE  NEGATIVE Final      Studies: Dg Chest 2 View  03/01/2012  *RADIOLOGY REPORT*  Clinical Data: Sickle cell crisis  CHEST - 2 VIEW  Comparison: January 14, 2012  Findings:  Port-A-Cath tip is at the superior vena cava - right atrium junction.  No pneumothorax.  There is stable mild scarring in the lung bases.  Lungs are otherwise clear.  Heart is mildly enlarged with the a mild degree of pulmonary venous hypertension which is stable.  No adenopathy. No bone lesions are appreciated.  IMPRESSION: There is evidence of mild cardiac enlargement with mild pulmonary venous hypertension.  A degree of volume overload/high output state is suspected, consistent with known sickle cell disease.  There is no frank edema or consolidation.  Mild lung scarring is stable.   Original Report Authenticated By: Bretta Bang, M.D.    Ct Angio Chest Pe W/cm &/or Wo Cm  03/01/2012  *RADIOLOGY REPORT*  Clinical Data: Shortness of breath.  Evaluate for pulmonary embolism.  History of sickle cell disease.  CT ANGIOGRAPHY CHEST  Technique:  Multidetector CT imaging of the chest using the standard protocol during bolus administration of intravenous contrast. Multiplanar reconstructed images including MIPs were obtained and reviewed to evaluate the vascular anatomy.  Contrast: OMNIPAQUE IOHEXOL 350 MG/ML SOLN  Comparison: Chest CT 11/21/2011.  Findings:  Mediastinum: There are multiple small segmental and subsegmental sized filling defects  within the pulmonary arteries to the lower lobes of the lungs bilaterally.  No larger central or lobar sized filling defects are noted at this time.  These emboli appear to be nonocclusive at this time.  The heart is moderately enlarged without definite signs of right-sided heart strain at this time. Left subclavian single lumen power Port-A-Cath with tip terminating in the right atrium. There is no significant pericardial fluid, thickening or pericardial calcification.  Mildly enlarged right hilar lymph node (1.5 cm in diameter), similar to prior.  Numerous reactive size mediastinal and left hilar lymph nodes are noted. Esophagus is unremarkable in appearance.  Lungs/Pleura: Extensive thickening of the peribronchovascular interstitium and septal thickening throughout the lower lungs bilaterally, with multiple areas of mild architectural distortion. The overall appearance is very similar to the prior examination, most compatible with chronic areas of scarring.  No definite suspicious appearing pulmonary nodules or masses.  No definite acute  consolidative air space disease.  No pleural effusions.  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  Cortical thinning in the posterior aspect of the upper pole of the left kidney, suggesting scarring.  Musculoskeletal: Multifocal sclerosis in the bones related to areas of avascular necrosis.  H shaped vertebral bodies compatible with the diagnosis of sickle cell disease. There are no aggressive appearing lytic or blastic lesions noted in the visualized portions of the skeleton.  IMPRESSION: 1.  Study is positive for segmental and subsegmental sized emboli to the lower lobes of the lungs bilaterally.  2.  Extensive chronic scarring throughout the lower lungs, similar to prior studies. 3.  Cardiomegaly. 4.  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 5.  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  Critical Value/emergent results were called by telephone at the time of interpretation on 03/01/2012  at 12:20 p.m. to Dr. Jeraldine Loots, who verbally acknowledged these results.   Original Report Authenticated By: Trudie Reed, M.D.     Scheduled Meds:   . enoxaparin (LOVENOX) injection  1 mg/kg Subcutaneous Q12H  . folic acid  1 mg Oral Daily  . hydroxyurea  500 mg Oral BID  . lisinopril  10 mg Oral Q2200  . OLANZapine  10 mg Oral QHS  . OxyCODONE  80 mg Oral Q12H  . polyethylene glycol  17 g Oral QHS  . potassium chloride SA  20 mEq Oral BID  . senna-docusate  1 tablet Oral BID  . Warfarin - Pharmacist Dosing Inpatient   Does not apply q1800   Continuous Infusions:   Time spent 30 minutes.

## 2012-03-03 DIAGNOSIS — K5900 Constipation, unspecified: Secondary | ICD-10-CM | POA: Diagnosis not present

## 2012-03-03 DIAGNOSIS — I10 Essential (primary) hypertension: Secondary | ICD-10-CM | POA: Diagnosis not present

## 2012-03-03 DIAGNOSIS — M8700 Idiopathic aseptic necrosis of unspecified bone: Secondary | ICD-10-CM | POA: Diagnosis not present

## 2012-03-03 DIAGNOSIS — I2699 Other pulmonary embolism without acute cor pulmonale: Secondary | ICD-10-CM | POA: Diagnosis not present

## 2012-03-03 DIAGNOSIS — D473 Essential (hemorrhagic) thrombocythemia: Secondary | ICD-10-CM | POA: Diagnosis not present

## 2012-03-03 DIAGNOSIS — D571 Sickle-cell disease without crisis: Secondary | ICD-10-CM | POA: Diagnosis not present

## 2012-03-03 DIAGNOSIS — D5700 Hb-SS disease with crisis, unspecified: Secondary | ICD-10-CM | POA: Diagnosis not present

## 2012-03-03 DIAGNOSIS — J9600 Acute respiratory failure, unspecified whether with hypoxia or hypercapnia: Secondary | ICD-10-CM | POA: Diagnosis not present

## 2012-03-03 DIAGNOSIS — Z96649 Presence of unspecified artificial hip joint: Secondary | ICD-10-CM | POA: Diagnosis not present

## 2012-03-03 MED ORDER — WARFARIN SODIUM 7.5 MG PO TABS
7.5000 mg | ORAL_TABLET | Freq: Once | ORAL | Status: AC
  Administered 2012-03-03: 7.5 mg via ORAL
  Filled 2012-03-03: qty 1

## 2012-03-03 NOTE — Progress Notes (Signed)
Subjective: Patient did much better. He complained of pain in his rib cage bilaterally. No significant shortness of breath no cough. He was admitted with recurrent PE. His last PE was about 5-6 years ago at that time he was on Coumadin but has been taken off of Coumadin now. With his fluid PE he knows that he has to be on Coumadin for life from now onwards. No nausea vomiting or diarrhea no abdominal pain.  Objective: Vital signs in last 24 hours: Temp:  [98 F (36.7 C)-98.6 F (37 C)] 98 F (36.7 C) (02/01 1018) Pulse Rate:  [62-87] 62  (02/01 1018) Resp:  [16-18] 18  (02/01 1018) BP: (98-119)/(60-74) 98/64 mmHg (02/01 1018) SpO2:  [99 %-100 %] 99 % (02/01 1018) Weight:  [76.204 kg (168 lb)] 76.204 kg (168 lb) (02/01 0630) Weight change: -3.175 kg (-7 lb) Last BM Date: 03/01/12  Intake/Output from previous day: 01/31 0701 - 02/01 0700 In: 1080 [P.O.:1080] Out: 1000 [Urine:1000] Intake/Output this shift:    General appearance: alert, cooperative, appears stated age and no distress Head: Normocephalic, without obvious abnormality, atraumatic Eyes: conjunctivae/corneas clear. PERRL, EOM's intact. Fundi benign. Ears: normal TM's and external ear canals both ears Resp: clear to auscultation bilaterally Chest wall: no tenderness Cardio: regular rate and rhythm, S1, S2 normal, no murmur, click, rub or gallop GI: soft, non-tender; bowel sounds normal; no masses,  no organomegaly Extremities: extremities normal, atraumatic, no cyanosis or edema Pulses: 2+ and symmetric Neurologic: Grossly normal  Lab Results:  Basename 03/01/12 0529 02/29/12 1528  WBC 16.2* 17.2*  HGB 7.1* 7.6*  HCT 20.7* 22.4*  PLT 485* 534*   BMET  Basename 03/01/12 0529 02/29/12 1528  NA 136 136  K 3.3* 3.2*  CL 103 102  CO2 23 21  GLUCOSE 125* 109*  BUN 6 5*  CREATININE 0.68 0.53  CALCIUM 8.7 9.0    Studies/Results: Dg Chest 2 View  03/01/2012  *RADIOLOGY REPORT*  Clinical Data: Sickle cell  crisis  CHEST - 2 VIEW  Comparison: January 14, 2012  Findings:  Port-A-Cath tip is at the superior vena cava - right atrium junction.  No pneumothorax.  There is stable mild scarring in the lung bases.  Lungs are otherwise clear.  Heart is mildly enlarged with the a mild degree of pulmonary venous hypertension which is stable.  No adenopathy. No bone lesions are appreciated.  IMPRESSION: There is evidence of mild cardiac enlargement with mild pulmonary venous hypertension.  A degree of volume overload/high output state is suspected, consistent with known sickle cell disease.  There is no frank edema or consolidation.  Mild lung scarring is stable.   Original Report Authenticated By: Bretta Bang, M.D.    Ct Angio Chest Pe W/cm &/or Wo Cm  03/01/2012  *RADIOLOGY REPORT*  Clinical Data: Shortness of breath.  Evaluate for pulmonary embolism.  History of sickle cell disease.  CT ANGIOGRAPHY CHEST  Technique:  Multidetector CT imaging of the chest using the standard protocol during bolus administration of intravenous contrast. Multiplanar reconstructed images including MIPs were obtained and reviewed to evaluate the vascular anatomy.  Contrast: OMNIPAQUE IOHEXOL 350 MG/ML SOLN  Comparison: Chest CT 11/21/2011.  Findings:  Mediastinum: There are multiple small segmental and subsegmental sized filling defects within the pulmonary arteries to the lower lobes of the lungs bilaterally.  No larger central or lobar sized filling defects are noted at this time.  These emboli appear to be nonocclusive at this time.  The heart is moderately  enlarged without definite signs of right-sided heart strain at this time. Left subclavian single lumen power Port-A-Cath with tip terminating in the right atrium. There is no significant pericardial fluid, thickening or pericardial calcification.  Mildly enlarged right hilar lymph node (1.5 cm in diameter), similar to prior.  Numerous reactive size mediastinal and left hilar  lymph nodes are noted. Esophagus is unremarkable in appearance.  Lungs/Pleura: Extensive thickening of the peribronchovascular interstitium and septal thickening throughout the lower lungs bilaterally, with multiple areas of mild architectural distortion. The overall appearance is very similar to the prior examination, most compatible with chronic areas of scarring.  No definite suspicious appearing pulmonary nodules or masses.  No definite acute consolidative air space disease.  No pleural effusions.  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  Cortical thinning in the posterior aspect of the upper pole of the left kidney, suggesting scarring.  Musculoskeletal: Multifocal sclerosis in the bones related to areas of avascular necrosis.  H shaped vertebral bodies compatible with the diagnosis of sickle cell disease. There are no aggressive appearing lytic or blastic lesions noted in the visualized portions of the skeleton.  IMPRESSION: 1.  Study is positive for segmental and subsegmental sized emboli to the lower lobes of the lungs bilaterally.  2.  Extensive chronic scarring throughout the lower lungs, similar to prior studies. 3.  Cardiomegaly. 4.  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 5.  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  Critical Value/emergent results were called by telephone at the time of interpretation on 03/01/2012  at 12:20 p.m. to Dr. Jeraldine Loots, who verbally acknowledged these results.   Original Report Authenticated By: Trudie Reed, M.D.     Medications: I have reviewed the patient's current medications.  Assessment/Plan: 32 year old gentleman with recurrent pulmonary embolism admitted with PE. Patient is not a sickle cell pain crisis.  #1 pulmonary embolus: Patient on anticoagulation  at the moment. We'll continue his Coumadin until INR is 2.0-3.0. Patient understands that he will have to be on anticoagulation for life. Pain control mainly per home medications. Additional coverage may be given if needed for his PE pain.  #2 sickle cell anemia: His H&H is stable near his baseline. No transfusion is necessary at this point. This  #3 hypokalemia: Replete his potassium.  #4 leukocytosis: Most likely from PE and chronic inflammation. Continue to monitor.  #5 disposition: We'll discharge when INR is 2.0-3.0  LOS: 2 days   Nicolet Griffy,[name] 03/03/2012, 11:21 AM

## 2012-03-03 NOTE — Progress Notes (Signed)
ANTICOAGULATION CONSULT NOTE - Follow Up  Pharmacy Consult for Lovenox/Warfarin Indication: New bilateral PE  Allergies  Allergen Reactions  . Morphine And Related Hives and Rash    Pt states he is able to tolerate Dilaudid with no reactions.    Patient Measurements: Height: 6' (182.9 cm) Weight: 168 lb (76.204 kg) IBW/kg (Calculated) : 77.6    Labs:  Basename 03/03/12 0550 03/02/12 0515 03/01/12 1421 03/01/12 0529 02/29/12 1528  HGB -- -- -- 7.1* 7.6*  HCT -- -- -- 20.7* 22.4*  PLT -- -- -- 485* 534*  APTT -- -- -- -- --  LABPROT 20.3* 15.3* 16.9* -- --  INR 1.81* 1.23 1.41 -- --  HEPARINUNFRC -- -- -- -- --  CREATININE -- -- -- 0.68 0.53  CKTOTAL -- -- -- -- --  CKMB -- -- -- -- --  TROPONINI -- -- -- -- --    Estimated Creatinine Clearance: 142.9 ml/min (by C-G formula based on Cr of 0.68).   Pertinent Medications:  Enoxaparin 80mg  SQ q12h  Assessment:  32 yo M with SCD, was being treated in sickle cell clinic and then transferred to ER on 1/30 with CC ongoing CP and new dyspnea on exertion.  Found to have bilateral PE on CT/Angio.    Patient reports a history of thromboembolism, not on any anticoagulation prior to admission.   Today is D#3/5 minimum warfarin/Lovenox overlap. (Note: Patient refused 1/30pm dose, so only received 1 therapeutic dose, instead of 2 doses)  INR still subtherapeutic as expected, but has jumped up over night. Will decrease tonight's warfarin dose to slow INR progression and avoid INR>3   Next CBC to be checked on 2/2 am  Scr is wnl with CrCl > 100 ml/min   Patient will need warfarin/lovenox overlap for at least 5 days and until INR is therapeutic x 24 hours.   No bleeding events reported in chart notes  Warfarin education was completed on 1/31 by previous pharmacist -  Patient demonstrated understanding and questions were answered.   Goal of Therapy:  INR 2-3 Monitor platelets by anticoagulation protocol: Yes Lovenox per renal  function, levels if needed   Plan:  1.) Continue Lovenox 80 mg SQ Q12h 2.) Warfarin 7.5 mg at 1800 3.) Daily PT/INR, CBC Q72h   Darrol Angel, PharmD Pager: [phone] 8:58 AM 03/03/2012

## 2012-03-03 NOTE — Progress Notes (Addendum)
[facility] PROGRESS NOTE  [name]:[id number] DOB: [birth year] DOA: 03/01/2012 PCP: Willey Blade, MD  Brief narrative: 32 y.o. 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 Patient was subsequently found to have pulmonary embolism based on CT angio chest done on admission. INR in admission 1.4.  Assessment/Plan:  Principal Problem:  *Acute hypoxic respiratory failure in the setting of pulmonary embolism  Continue coumadin and lovenox per pharmacy  INR 1.8 today Active Problems: Sickle cell pain  Continue current analgesia with dilaudid 2 mg Q 3 hours IV PRN, dilaudid 4-8 mg PO Q 4 hours; oxycodone 80 mg PO Q 12 hours Leukocytosis  Related to PE  Follow up CBC in am  Hypokalemia  Follow up BMP in am  Thrombocytosis  Related to sickle cell crisis  Sickle cell anemia  Continue folic acid, hydroxyurea    Code Status: Full Code  Family Communication: N/A  Disposition Plan: Home in am  Manson Passey, MD  [facility] Pager [phone]  If 7PM-7AM, please contact night-coverage [email or web address] Password [facility] 03/03/2012, 9:34 AM   LOS: 2 days   HPI/Subjective: No acute overnight events.  Objective: Filed Vitals:   03/02/12 1828 03/02/12 2150 03/03/12 0200 03/03/12 0630  BP: 119/64 118/73 101/60 115/74  Pulse: 71 85 74 87  Temp: 98 F (36.7 C) 98.6 F (37 C) 98.5 F (36.9 C) 98.5 F (36.9 C)  TempSrc: Oral Oral Oral Oral  Resp: 17 16 16 16   Height:      Weight:    76.204 kg (168 lb)  SpO2: 100% 99% 99% 100%    Intake/Output Summary (Last 24 hours) at 03/03/12 0934 Last data filed at 03/03/12 0100  Gross per 24 hour  Intake    840 ml  Output   1000 ml  Net   -160 ml    Exam:   General:  Pt is alert, follows commands appropriately, not in acute distress  Cardiovascular: Regular rate and  rhythm, S1/S2, no murmurs, no rubs, no gallops  Respiratory: Clear to auscultation bilaterally, no wheezing, no crackles, no rhonchi  Abdomen: Soft, non tender, non distended, bowel sounds present, no guarding  Extremities: No edema, pulses DP and PT palpable bilaterally  Neuro: Grossly nonfocal  Data Reviewed: Basic Metabolic Panel:  Lab 03/01/12 8295 02/29/12 1528  NA 136 136  K 3.3* 3.2*  CL 103 102  CO2 23 21  GLUCOSE 125* 109*  BUN 6 5*  CREATININE 0.68 0.53  CALCIUM 8.7 9.0  MG 1.8 --  PHOS -- --   Liver Function Tests:  Lab 03/01/12 0529 02/29/12 1528  AST 28 32  ALT 15 20  ALKPHOS 68 75  BILITOT 2.7* 4.0*  PROT 7.0 7.9  ALBUMIN 3.7 4.0   No results found for this basename: LIPASE:5,AMYLASE:5 in the last 168 hours No results found for this basename: AMMONIA:5 in the last 168 hours CBC:  Lab 03/01/12 0529 02/29/12 1528  WBC 16.2* 17.2*  NEUTROABS 9.5* 12.9*  HGB 7.1* 7.6*  HCT 20.7* 22.4*  MCV 91.6 91.8  PLT 485* 534*    Recent Results (from the past 240 hour(s))  MRSA PCR SCREENING     Status: Normal   Collection Time   03/01/12  6:12 PM      Component Value Range Status Comment   MRSA by PCR NEGATIVE  NEGATIVE Final  Studies: Dg Chest 2 View 03/01/2012  *  IMPRESSION: There is evidence of mild cardiac enlargement with mild pulmonary venous hypertension.  A degree of volume overload/high output state is suspected, consistent with known sickle cell disease.  There is no frank edema or consolidation.  Mild lung scarring is stable.       Ct Angio Chest Pe W/cm &/or Wo Cm 03/01/2012    IMPRESSION: 1.  Study is positive for segmental and subsegmental sized emboli to the lower lobes of the lungs bilaterally.  2.  Extensive chronic scarring throughout the lower lungs, similar to prior studies. 3.  Cardiomegaly. 4.  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 5.  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     Scheduled Meds:   . enoxaparin (LOVENOX) injection  1 mg/kg Subcutaneous Q12H  . folic acid  1 mg Oral Daily  . hydroxyurea  500 mg Oral BID  . lisinopril  10 mg Oral Q2200  . OLANZapine  10 mg Oral QHS  . OxyCODONE  80 mg Oral Q12H  . polyethylene glycol  17 g Oral QHS  . potassium chloride SA  20 mEq Oral BID  . senna-docusate  1 tablet Oral BID  . warfarin  7.5 mg Oral ONCE-1800  . Warfarin - Pharmacist Dosing Inpatient   Does not apply [phone]

## 2012-03-04 DIAGNOSIS — Z96649 Presence of unspecified artificial hip joint: Secondary | ICD-10-CM | POA: Diagnosis not present

## 2012-03-04 DIAGNOSIS — E876 Hypokalemia: Secondary | ICD-10-CM

## 2012-03-04 DIAGNOSIS — D473 Essential (hemorrhagic) thrombocythemia: Secondary | ICD-10-CM | POA: Diagnosis not present

## 2012-03-04 DIAGNOSIS — I10 Essential (primary) hypertension: Secondary | ICD-10-CM | POA: Diagnosis not present

## 2012-03-04 DIAGNOSIS — J9600 Acute respiratory failure, unspecified whether with hypoxia or hypercapnia: Secondary | ICD-10-CM | POA: Diagnosis not present

## 2012-03-04 DIAGNOSIS — D5700 Hb-SS disease with crisis, unspecified: Secondary | ICD-10-CM | POA: Diagnosis not present

## 2012-03-04 DIAGNOSIS — I2699 Other pulmonary embolism without acute cor pulmonale: Secondary | ICD-10-CM | POA: Diagnosis not present

## 2012-03-04 DIAGNOSIS — K5900 Constipation, unspecified: Secondary | ICD-10-CM | POA: Diagnosis not present

## 2012-03-04 LAB — CBC
HCT: 20 % — ABNORMAL LOW (ref 39.0–52.0)
Hemoglobin: 6.8 g/dL — CL (ref 13.0–17.0)
RBC: 2.21 MIL/uL — ABNORMAL LOW (ref 4.22–5.81)
RDW: 22 % — ABNORMAL HIGH (ref 11.5–15.5)
WBC: 15.4 10*3/uL — ABNORMAL HIGH (ref 4.0–10.5)

## 2012-03-04 LAB — PROTIME-INR: INR: 2.54 — ABNORMAL HIGH (ref 0.00–1.49)

## 2012-03-04 MED ORDER — OXYCODONE HCL 80 MG PO TB12: 80.0000 mg | ORAL_TABLET | Freq: Two times a day (BID) | ORAL | Status: DC

## 2012-03-04 MED ORDER — HYDROMORPHONE HCL 4 MG PO TABS: 4.0000 mg | ORAL_TABLET | ORAL | Status: DC | PRN

## 2012-03-04 MED ORDER — HYDROMORPHONE HCL PF 2 MG/ML IJ SOLN
3.0000 mg | INTRAMUSCULAR | Status: DC | PRN
  Administered 2012-03-04 – 2012-03-05 (×8): 3 mg via INTRAVENOUS
  Filled 2012-03-04 (×8): qty 2

## 2012-03-04 NOTE — Progress Notes (Signed)
ANTICOAGULATION CONSULT NOTE - Follow Up  Pharmacy Consult for Lovenox/Warfarin Indication: New bilateral PE  Allergies  Allergen Reactions  . Morphine And Related Hives and Rash    Pt states he is able to tolerate Dilaudid with no reactions.    Patient Measurements: Height: 6' (182.9 cm) Weight: 166 lb 7.2 oz (75.5 kg) IBW/kg (Calculated) : 77.6    Labs:  Basename 03/04/12 0615 03/03/12 0550 03/02/12 0515  HGB 6.8* -- --  HCT 20.0* -- --  PLT 507* -- --  APTT -- -- --  LABPROT 26.1* 20.3* 15.3*  INR 2.54* 1.81* 1.23  HEPARINUNFRC -- -- --  CREATININE -- -- --  CKTOTAL -- -- --  CKMB -- -- --  TROPONINI -- -- --    Estimated Creatinine Clearance: 141.6 ml/min (by C-G formula based on Cr of 0.68).   Pertinent Medications:  Enoxaparin 80mg  SQ q12h  Assessment:  32 yo M with SCD, was being treated in sickle cell clinic and then transferred to ER on 1/30 with CC ongoing CP and new dyspnea on exertion.  Found to have bilateral PE on CT/Angio. Patient has a history of thromboembolism, not on any anticoagulation prior to admission.  Per MD, will now be on life-long warfarin.  Today is D#4/5 minimum warfarin/Lovenox overlap. (Note: Patient refused 1/30pm dose, so only received 1 therapeutic dose, instead of 2 doses)  INR continues to rise sharply, now therapeutic after only 3 doses of warfarin. Will not give warfarin tonight to slow INR progression and hopefully avoid INR>3 in the next few days.  Pt will still need to complete a 5 day overlap of Lovenox/warfarin per CHEST guidelines (to give warfarin a chance to have an effect on clotting factors). If INR remains >2 tomorrow, Lovenox may be d/c'd AFTER both 2/3 doses are given.  Scr is wnl with CrCl > 100 ml/min, plts high.   No bleeding events reported in chart notes  Warfarin education was completed on 1/31 by previous pharmacist -  Patient demonstrated understanding and questions were answered.   Goal of Therapy:   INR 2-3 Monitor platelets by anticoagulation protocol: Yes Lovenox per renal function, levels if needed   Plan:  1.) Continue Lovenox 80 mg SQ Q12h to complete 5 day overlap 2.) No warfarin tonight - slow INR progression 3.) Daily PT/INR, CBC Q72h   Darrol Angel, PharmD Pager: [phone] 9:03 AM 03/04/2012

## 2012-03-04 NOTE — Progress Notes (Signed)
[facility] PROGRESS NOTE  [name] WJX:[id number] DOB: 07/13/1980 DOA: 03/01/2012 PCP: Willey Blade, MD  Brief narrative: 32 y.o. 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 Patient was subsequently found to have pulmonary embolism based on CT angio chest done on admission. INR in admission 1.4.  Although INR at therapeutic level today patient needs 1 more day of overlap with Lovenox injections per protocol. In my conversation with the patient he reported he would not be able to fill the prescriptions for today nor he is not sure if can schedule INR follow up with PCP. Since tomorrow he will be done with Lovenox injections I think it is reasonable to have patient one more day to complete overlap and discharge patient in am.  Assessment/Plan:   Principal Problem:  *Acute hypoxic respiratory failure in the setting of pulmonary embolism  Continue coumadin and lovenox per pharmacy  INR therapeutic today Active Problems:  Sickle cell pain  Continue current analgesia with dilaudid 3 mg Q 3 hours IV PRN, dilaudid 4-8 mg PO Q 4 hours; oxycodone 80 mg PO Q 12 hours Leukocytosis  Related to PE  Trending down Hypokalemia  Follow up BMP in am Thrombocytosis  Related to sickle cell crisis Anemia Related to sickle cell disease Hemoglobin today 6.8, we will give 1 unit PRBC today Continue folic acid, hydroxyurea   Code Status: Full Code  Family Communication: N/A  Disposition Plan: Home in am   Manson Passey, MD  [facility]  Pager [phone]    If 7PM-7AM, please contact night-coverage [email or web address] Password [facility] 03/04/2012, 6:14 PM   LOS: 3 days   HPI/Subjective: No acute overnight events.  Objective: Filed Vitals:   03/04/12 1217 03/04/12 1317 03/04/12 1417 03/04/12 1500  BP: 104/58 113/69 109/64 106/64  Pulse: 77  70 71 74  Temp: 98.3 F (36.8 C) 97.9 F (36.6 C) 99.2 F (37.3 C) 98.3 F (36.8 C)  TempSrc: Oral Oral Oral Oral  Resp: 16 16 16 16   Height:      Weight:      SpO2: 99% 100% 100% 99%    Intake/Output Summary (Last 24 hours) at 03/04/12 1814 Last data filed at 03/04/12 1445  Gross per 24 hour  Intake 586.67 ml  Output      0 ml  Net 586.67 ml    Exam:   General:  Pt is alert, follows commands appropriately, not in acute distress  Cardiovascular: Regular rate and rhythm, S1/S2, no murmurs, no rubs, no gallops  Respiratory: Clear to auscultation bilaterally, no wheezing, no crackles, no rhonchi  Abdomen: Soft, non tender, non distended, bowel sounds present, no guarding  Extremities: No edema, pulses DP and PT palpable bilaterally  Neuro: Grossly nonfocal  Data Reviewed: Basic Metabolic Panel:  Lab 03/01/12 7846 02/29/12 1528  NA 136 136  K 3.3* 3.2*  CL 103 102  CO2 23 21  GLUCOSE 125* 109*  BUN 6 5*  CREATININE 0.68 0.53  CALCIUM 8.7 9.0  MG 1.8 --  PHOS -- --   Liver Function Tests:  Lab 03/01/12 0529 02/29/12 1528  AST 28 32  ALT 15 20  ALKPHOS 68 75  BILITOT 2.7* 4.0*  PROT 7.0 7.9  ALBUMIN 3.7 4.0   No results found for this basename: LIPASE:5,AMYLASE:5 in the last 168 hours No results found for this basename: AMMONIA:5  in the last 168 hours CBC:  Lab 03/04/12 0615 03/01/12 0529 02/29/12 1528  WBC 15.4* 16.2* 17.2*  NEUTROABS -- 9.5* 12.9*  HGB 6.8* 7.1* 7.6*  HCT 20.0* 20.7* 22.4*  MCV 92.8 91.6 91.8  PLT 507* 485* 534*   Cardiac Enzymes: No results found for this basename: CKTOTAL:5,CKMB:5,CKMBINDEX:5,TROPONINI:5 in the last 168 hours BNP: No components found with this basename: POCBNP:5 CBG: No results found for this basename: GLUCAP:5 in the last 168 hours  Recent Results (from the past 240 hour(s))  MRSA PCR SCREENING     Status: Normal   Collection Time   03/01/12  6:12 PM      Component Value Range Status Comment   MRSA by  PCR NEGATIVE  NEGATIVE Final      Studies: No results found.  Scheduled Meds:   . enoxaparin (LOVENOX) injection  1 mg/kg Subcutaneous Q12H  . folic acid  1 mg Oral Daily  . hydroxyurea  500 mg Oral BID  . lisinopril  10 mg Oral Q2200  . OLANZapine  10 mg Oral QHS  . OxyCODONE  80 mg Oral Q12H  . polyethylene glycol  17 g Oral QHS  . potassium chloride SA  20 mEq Oral BID  . senna-docusate  1 tablet Oral BID  . Warfarin - Pharmacist Dosing Inpatient   Does not apply q1800   Continuous Infusions:

## 2012-03-05 DIAGNOSIS — J9600 Acute respiratory failure, unspecified whether with hypoxia or hypercapnia: Secondary | ICD-10-CM | POA: Diagnosis not present

## 2012-03-05 DIAGNOSIS — D5700 Hb-SS disease with crisis, unspecified: Secondary | ICD-10-CM | POA: Diagnosis not present

## 2012-03-05 DIAGNOSIS — Z96649 Presence of unspecified artificial hip joint: Secondary | ICD-10-CM | POA: Diagnosis not present

## 2012-03-05 DIAGNOSIS — D473 Essential (hemorrhagic) thrombocythemia: Secondary | ICD-10-CM | POA: Diagnosis not present

## 2012-03-05 DIAGNOSIS — I10 Essential (primary) hypertension: Secondary | ICD-10-CM | POA: Diagnosis not present

## 2012-03-05 DIAGNOSIS — I2699 Other pulmonary embolism without acute cor pulmonale: Secondary | ICD-10-CM | POA: Diagnosis not present

## 2012-03-05 LAB — PROTIME-INR: Prothrombin Time: 23.5 seconds — ABNORMAL HIGH (ref 11.6–15.2)

## 2012-03-05 MED ORDER — WARFARIN SODIUM 7.5 MG PO TABS
7.5000 mg | ORAL_TABLET | Freq: Once | ORAL | Status: DC
  Filled 2012-03-05: qty 1

## 2012-03-05 MED ORDER — HEPARIN SOD (PORK) LOCK FLUSH 100 UNIT/ML IV SOLN
INTRAVENOUS | Status: AC
  Administered 2012-03-05: 500 [IU]
  Filled 2012-03-05: qty 5

## 2012-03-05 MED ORDER — ENOXAPARIN SODIUM 120 MG/0.8ML ~~LOC~~ SOLN: 1.5000 mg/kg | SUBCUTANEOUS | Status: DC

## 2012-03-05 MED ORDER — ENOXAPARIN SODIUM 120 MG/0.8ML ~~LOC~~ SOLN
1.5000 mg/kg | SUBCUTANEOUS | Status: DC
  Administered 2012-03-05: 115 mg via SUBCUTANEOUS
  Filled 2012-03-05: qty 0.8

## 2012-03-05 MED ORDER — WARFARIN SODIUM 5 MG PO TABS: 7.5000 mg | ORAL_TABLET | Freq: Once | ORAL | Status: DC

## 2012-03-05 NOTE — Telephone Encounter (Signed)
See phone call note

## 2012-03-05 NOTE — Progress Notes (Signed)
ANTICOAGULATION CONSULT NOTE - Follow Up  Pharmacy Consult for Lovenox/Warfarin Indication: New bilateral PE  Allergies  Allergen Reactions  . Morphine And Related Hives and Rash    Pt states he is able to tolerate Dilaudid with no reactions.   Patient Measurements: Height: 6' (182.9 cm) Weight: 168 lb 4.8 oz (76.34 kg) IBW/kg (Calculated) : 77.6   Labs:  Basename 03/05/12 0620 03/04/12 0615 03/03/12 0550  HGB -- 6.8* --  HCT -- 20.0* --  PLT -- 507* --  APTT -- -- --  LABPROT 23.5* 26.1* 20.3*  INR 2.20* 2.54* 1.81*  HEPARINUNFRC -- -- --  CREATININE -- -- --  CKTOTAL -- -- --  CKMB -- -- --  TROPONINI -- -- --   Estimated Creatinine Clearance: 143.1 ml/min (by C-G formula based on Cr of 0.68).  Assessment:  32 yo M with SCD and hx of thromboembolism with acute bilateral PE found 1/30. Plan is for life-long anticoagulation.   INR therapeutic for the 2nd day and leveled off after quick rise and holding the dose yesterday.  Today is Day #5/5 warfarin/Lovenox overlap. Per CHEST guidelines he should receive both Lovenox doses today.   Tolerating his diet. No bleeding reported/documented.  Goal of Therapy:  INR 2-3 Monitor platelets by anticoagulation protocol: Yes Lovenox per renal function, levels if needed   Plan:   Continue Lovenox 80 mg SQ Q12h to complete 5 day overlap.  Give warfarin 7.5mg  today. Recommend 7.5mg  daily on discharge with CLOSE follow-up.  Daily PT/INR, CBC Q72h.  Charolotte Eke, PharmD, pager [phone]. 03/05/2012,7:08 AM.

## 2012-03-07 ENCOUNTER — Non-Acute Institutional Stay (HOSPITAL_COMMUNITY)
Admission: RE | Admit: 2012-03-07 | Discharge: 2012-03-07 | Disposition: A | Discharge status: Home / Self Care | Payer: Medicare Other | Source: Ambulatory Visit | Attending: Internal Medicine | Admitting: Internal Medicine

## 2012-03-07 DIAGNOSIS — Z452 Encounter for adjustment and management of vascular access device: Secondary | ICD-10-CM | POA: Diagnosis not present

## 2012-03-07 DIAGNOSIS — I10 Essential (primary) hypertension: Secondary | ICD-10-CM | POA: Insufficient documentation

## 2012-03-07 DIAGNOSIS — Z86718 Personal history of other venous thrombosis and embolism: Secondary | ICD-10-CM | POA: Insufficient documentation

## 2012-03-07 DIAGNOSIS — D571 Sickle-cell disease without crisis: Secondary | ICD-10-CM | POA: Diagnosis not present

## 2012-03-07 LAB — CBC WITH DIFFERENTIAL/PLATELET
Basophils Absolute: 0.2 10*3/uL — ABNORMAL HIGH (ref 0.0–0.1)
Basophils Relative: 1 % (ref 0–1)
Eosinophils Absolute: 0.5 10*3/uL (ref 0.0–0.7)
Eosinophils Relative: 3 % (ref 0–5)
HCT: 22 % — ABNORMAL LOW (ref 39.0–52.0)
Hemoglobin: 7.5 g/dL — ABNORMAL LOW (ref 13.0–17.0)
Lymphocytes Relative: 11 % — ABNORMAL LOW (ref 12–46)
Lymphs Abs: 1.9 10*3/uL (ref 0.7–4.0)
MCH: 31.3 pg (ref 26.0–34.0)
MCHC: 34.1 g/dL (ref 30.0–36.0)
MCV: 91.7 fL (ref 78.0–100.0)
Monocytes Absolute: 1.7 10*3/uL — ABNORMAL HIGH (ref 0.1–1.0)
Monocytes Relative: 10 % (ref 3–12)
Neutro Abs: 12.3 10*3/uL — ABNORMAL HIGH (ref 1.7–7.7)
Neutrophils Relative %: 74 % (ref 43–77)
Platelets: 547 10*3/uL — ABNORMAL HIGH (ref 150–400)
RBC: 2.4 MIL/uL — ABNORMAL LOW (ref 4.22–5.81)
RDW: 20.5 % — ABNORMAL HIGH (ref 11.5–15.5)
WBC: 16.6 10*3/uL — ABNORMAL HIGH (ref 4.0–10.5)

## 2012-03-07 LAB — COMPREHENSIVE METABOLIC PANEL WITH GFR
ALT: 29 U/L (ref 0–53)
AST: 44 U/L — ABNORMAL HIGH (ref 0–37)
Albumin: 4 g/dL (ref 3.5–5.2)
Alkaline Phosphatase: 77 U/L (ref 39–117)
BUN: 15 mg/dL (ref 6–23)
CO2: 21 meq/L (ref 19–32)
Calcium: 8.7 mg/dL (ref 8.4–10.5)
Chloride: 107 meq/L (ref 96–112)
Creatinine, Ser: 0.65 mg/dL (ref 0.50–1.35)
GFR calc Af Amer: >90 mL/min
GFR calc non Af Amer: >90 mL/min
Glucose, Bld: 119 mg/dL — ABNORMAL HIGH (ref 70–99)
Potassium: 3.7 meq/L (ref 3.5–5.1)
Sodium: 139 meq/L (ref 135–145)
Total Bilirubin: 3.4 mg/dL — ABNORMAL HIGH (ref 0.3–1.2)
Total Protein: 7.5 g/dL (ref 6.0–8.3)

## 2012-03-07 LAB — PROTIME-INR: Prothrombin Time: 29.1 seconds — ABNORMAL HIGH (ref 11.6–15.2)

## 2012-03-07 MED ORDER — HEPARIN SOD (PORK) LOCK FLUSH 100 UNIT/ML IV SOLN
500.0000 [IU] | INTRAVENOUS | Status: AC | PRN
  Administered 2012-03-07: 500 [IU]
  Filled 2012-03-07: qty 5

## 2012-03-07 MED ORDER — SODIUM CHLORIDE 0.9 % IJ SOLN
10.0000 mL | INTRAMUSCULAR | Status: AC | PRN
  Administered 2012-03-07: 10 mL

## 2012-03-07 NOTE — Discharge Summary (Signed)
[name] MRN: [id number] DOB/AGE: 04-17-1980 32 y.o.  Admit date: 02/29/2012 Discharge date: 03/07/2012  Primary Care Physician:  Willey Blade, MD   Discharge Diagnoses:   Patient Active Problem List  Diagnosis  . Leukocytosis  . Hypokalemia  . Thrombocytosis  . Sickle cell anemia  . Acute embolism and thrombosis of right internal jugular vein  . Avascular necrosis  . Sickle cell anemia with crisis  . Secondary hemochromatosis  . Mood disorder  . Hypertension  . Pulmonary embolism with hypoxia    DISCHARGE MEDICATION:   Medication List     As of 03/05/2012  10:15 AM    ASK your doctor about these medications         folic acid 1 MG tablet   Commonly known as: FOLVITE   Take 1 tablet (1 mg total) by mouth daily.      HYDROmorphone 4 MG tablet   Commonly known as: DILAUDID   Take 1-2 tablets (4-8 mg total) by mouth every 4 (four) hours as needed for pain. For pain.      hydroxyurea 500 MG capsule   Commonly known as: HYDREA   Take 1 capsule (500 mg total) by mouth 2 (two) times daily. May take with food to minimize GI side effects.      ibuprofen 800 MG tablet   Commonly known as: ADVIL,MOTRIN   Take 1 tablet (800 mg total) by mouth every 8 (eight) hours as needed for pain. Do not exceed use for more than 20/30 days      lisinopril 10 MG tablet   Commonly known as: PRINIVIL,ZESTRIL   Take 1 tablet (10 mg total) by mouth daily at 10 pm.      OLANZapine 10 MG tablet   Commonly known as: ZYPREXA   Take 10 mg by mouth at bedtime.      oxyCODONE 80 MG 12 hr tablet   Commonly known as: OXYCONTIN   Take 1 tablet (80 mg total) by mouth every 12 (twelve) hours.      polyethylene glycol packet   Commonly known as: MIRALAX / GLYCOLAX   Take 17 g by mouth at bedtime.      potassium chloride SA 20 MEQ tablet   Commonly known as: K-DUR,KLOR-CON   Take 1 tablet (20 mEq total) by mouth 2 (two) times daily.      senna-docusate 8.6-50 MG per tablet   Commonly known  as: Senokot-S   Take 1 tablet by mouth 2 (two) times daily.      zolpidem 10 MG tablet   Commonly known as: AMBIEN   Take 1 tablet (10 mg total) by mouth at bedtime as needed. For sleep          Consults:     SIGNIFICANT DIAGNOSTIC STUDIES:  Dg Chest 2 View  03/01/2012  *RADIOLOGY REPORT*  Clinical Data: Sickle cell crisis  CHEST - 2 VIEW  Comparison: January 14, 2012  Findings:  Port-A-Cath tip is at the superior vena cava - right atrium junction.  No pneumothorax.  There is stable mild scarring in the lung bases.  Lungs are otherwise clear.  Heart is mildly enlarged with the a mild degree of pulmonary venous hypertension which is stable.  No adenopathy. No bone lesions are appreciated.  IMPRESSION: There is evidence of mild cardiac enlargement with mild pulmonary venous hypertension.  A degree of volume overload/high output state is suspected, consistent with known sickle cell disease.  There is no frank edema or consolidation.  Mild lung scarring is stable.   Original Report Authenticated By: Bretta Bang, M.D.    Ct Angio Chest Pe W/cm &/or Wo Cm  03/01/2012  *RADIOLOGY REPORT*  Clinical Data: Shortness of breath.  Evaluate for pulmonary embolism.  History of sickle cell disease.  CT ANGIOGRAPHY CHEST  Technique:  Multidetector CT imaging of the chest using the standard protocol during bolus administration of intravenous contrast. Multiplanar reconstructed images including MIPs were obtained and reviewed to evaluate the vascular anatomy.  Contrast: OMNIPAQUE IOHEXOL 350 MG/ML SOLN  Comparison: Chest CT 11/21/2011.  Findings:  Mediastinum: There are multiple small segmental and subsegmental sized filling defects within the pulmonary arteries to the lower lobes of the lungs bilaterally.  No larger central or lobar sized filling defects are noted at this time.  These emboli appear to be nonocclusive at this time.  The heart is moderately enlarged without definite signs of right-sided  heart strain at this time. Left subclavian single lumen power Port-A-Cath with tip terminating in the right atrium. There is no significant pericardial fluid, thickening or pericardial calcification.  Mildly enlarged right hilar lymph node (1.5 cm in diameter), similar to prior.  Numerous reactive size mediastinal and left hilar lymph nodes are noted. Esophagus is unremarkable in appearance.  Lungs/Pleura: Extensive thickening of the peribronchovascular interstitium and septal thickening throughout the lower lungs bilaterally, with multiple areas of mild architectural distortion. The overall appearance is very similar to the prior examination, most compatible with chronic areas of scarring.  No definite suspicious appearing pulmonary nodules or masses.  No definite acute consolidative air space disease.  No pleural effusions.  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  Cortical thinning in the posterior aspect of the upper pole of the left kidney, suggesting scarring.  Musculoskeletal: Multifocal sclerosis in the bones related to areas of avascular necrosis.  H shaped vertebral bodies compatible with the diagnosis of sickle cell disease. There are no aggressive appearing lytic or blastic lesions noted in the visualized portions of the skeleton.  IMPRESSION: 1.  Study is positive for segmental and subsegmental sized emboli to the lower lobes of the lungs bilaterally.  2.  Extensive chronic scarring throughout the lower lungs, similar to prior studies. 3.  Cardiomegaly. 4.  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 5.  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  Critical Value/emergent results were called by telephone at the time of interpretation on 03/01/2012  at 12:20 p.m. to Dr. Jeraldine Loots, who verbally acknowledged  these results.   Original Report Authenticated By: Trudie Reed, M.D.           Recent Results (from the past 240 hour(s))  MRSA PCR SCREENING     Status: Normal   Collection Time   03/01/12  6:12 PM      Component Value Range Status Comment   MRSA by PCR NEGATIVE  NEGATIVE Final     BRIEF ADMITTING H & P: Mr. Deliah Goody is very well-known to me from past hospitalizations. He was being seen in the outpatient sickle cell Center for management of acute vaso-occlusive crisis. During his interview with the nurse practitioner the patient mentioned that he had been having dyspnea on exertion which is a new complaint. I further evaluated the patient and interviewed him. Based on this I felt the patient needs to be  evaluated for pulmonary embolus. He was sent to the emergency room where evaluation did show a pulmonary embolus on CT angiogram. The patient is admitted for further evaluation and management.    Hospital Course:  Present on Admission:  1. Pulmonary embolus: The patient was diagnosed by CT angiogram. He was admitted and started on Coumadin and Lovenox. The patient was treated with Lovenox and was therapeutic at 2 days. The patient is to continue on Lovenox for additional 3 days for a complete five-day overlap while therapeutic. He was treated with warfarin while hospitalized and recommended to be discharged on warfarin 7.5 mg. The patient is to have his INR checked in the office on 03/07/2012 and his Coumadin dose further adjusted based on INR levels. The patient demonstrated competence administering Lovenox prior to leaving the hospital  2. sickle cell disease with crisis: The patient was initially treated for vaso-occlusive crisis. By the time the patient was admitted to the hospital his pain was well-controlled. He was transitioned over to his home regimen which he continued the hospital. At the time of discharge the patient's pain was well-controlled. He is to continue on his OxyContin  schedule every 12 hours and Dilaudid every 4 hours as needed for pain.  Disposition and Follow-up:  The patient was discharged in stable condition. He's to followup with his primary care physician Dr. [name] in one week. The patient is to follow up as a walk-in in the clinic on 03/07/2012 to check his INR and further adjust his Coumadin.   DISCHARGE EXAM:  General: Alert, awake, oriented x3, in no acute distress. Well-appearing. Vital Signs:BP 104/61, HR 86, T 98.4 F (36.9 C), temperature source Oral, RR 18, SpO2 97.00%. HEENT: [place]/AT PEERL, EOMI, Anicteric Heart: Regular rate and rhythm, without murmurs, rubs, gallops.  Lungs: Clear to auscultation, no wheezing or rhonchi noted.   Abdomen: Soft, nontender, nondistended, positive bowel sounds, no masses no hepatosplenomegaly noted.  Neuro: No focal neurological deficits noted cranial nerves II through XII grossly intact. Strength normal in bilateral upper and lower extremities. Musculoskeletal: No warm swelling or erythema around joints, no spinal tenderness noted. Psychiatric: Patient alert and oriented x3, good insight and cognition, good recent to remote recall.     Basename 03/07/12 1340  NA 139  K 3.7  CL 107  CO2 21  GLUCOSE 119*  BUN 15  CREATININE 0.65  CALCIUM 8.7  MG --  PHOS --    Basename 03/07/12 1340  AST 44*  ALT 29  ALKPHOS 77  BILITOT 3.4*  PROT 7.5  ALBUMIN 4.0   No results found for this basename: LIPASE:2,AMYLASE:2 in the last 72 hours  Basename 03/07/12 1340  WBC 16.6*  NEUTROABS 12.3*  HGB 7.5*  HCT 22.0*  MCV 91.7  PLT 547*   Total time for discharge including decision-making face-to-face time is greater than 30 minutes  Signed: [name],[name] 03/07/2012, 6:15 PM

## 2012-03-07 NOTE — Progress Notes (Signed)
Patient presented to the office (Dr. [name]) to obtain labs. He was discharged from the hospital [facility]) on Monday 03/05/12 Dx PE and placed on Coumadin. Most recent INR 2.20 on 03/05/12. He has no active bleeding and denies hemoptysis, hematemesis, hematochezia or melena. Office staff unable to obtain peripheral labs and he will have his Left Chest Port-A-Cath accessed for CBC, CMET,Ferritin, 25-OH Vitamin D, PT/INR as requested by Dr. [name] and flushed per protocol prior to discharge. We will defer INR monitoring and dose adjustments to his PCP, Dr. [name].  We have conferred with Dr. Ashley Royalty regarding the plan of care for this patient  Lizbeth Bark, [facility] 03/07/12

## 2012-03-08 LAB — TYPE AND SCREEN
Antibody Screen: NEGATIVE
Unit division: 0
Unit division: 0

## 2012-03-08 NOTE — Progress Notes (Signed)
Patient presented to have his INR checked in the office. However he required access of the support for the opinion of labs. Given the importance of having the INR done the patient's Port-A-Cath was accessed on the sickle cell center and labs drawn at that time to check the INR. Agree with procedure and obtaining of labs.

## 2012-03-08 NOTE — Discharge Summary (Signed)
Discharge Summary Place in for progress note in error    Subjective:  [name] seen today had significant complaints of shortness of breath and dizziness in which he felt somewhat better. He had orthostatic Bp taken no significant changes but O2 sats dropped to 85% with exertion he actually walked 50 feet. Significant PMH of PE he was also evaluated by Dr. Ashley Royalty and felt further evaluation was needed and transferred to the ED.  Objective:  Vital signs in last 24 hours:  Filed Vitals:    03/01/12 0903  03/01/12 0904  03/01/12 0905  03/01/12 0906   BP:  103/72  97/65     Pulse:       Temp:       TempSrc:       Resp:  18  18  20     SpO2:  100%  98%  95%  85%    Weight change:   Intake/Output Summary (Last 24 hours) at 03/01/12 0958 Last data filed at 02/29/12 1755   Gross per 24 hour   Intake  436.67 ml   Output  0 ml   Net  436.67 ml    Physical Exam:  Orthostatic Vital Signs:  Lying- temp 98.1 BP 107/61 HR 58 RR 18 pulsox 98%  Sitting- BP 107/72 HR 64 HR 64 RR 18 pulsox 100%  Standing- BP 97/65 HR 73 RR 18 pulsox 98%  pulsox sustained 95%, pulsox 85% after ambulating 50 feet. When returning to standing position, pulsox returned to sustained 98  Temperature: 98.1,  General Appearance: Alert, Ox3, pleasant and cooperative  Head: Normocephalic, atraumatic  Eyes: PERRL, slight strabismus, mild-mod icteric sclera  Back: Symmetric, no CVA tenderness  Resp: even and nonlabored, diminished bilateral bases, no vocal fremitus; no wheezes/rales/rhonchi  Cardio: S1, S2 regular, no murmurs/clicks/rubs/gallops porta cath upper right chest  GI: Soft,, Non distended , + bowel sounds, no masses or organomegaly appreciated, bilateral flank pain guarding during exam  Male Genitalia: Deferred; denies priapism  Extremities: Extremities normal, atraumatic, no cyanosis or edema, negative Homans  Pulses: 2+ and symmetric  Skin: Skin intact numerous tatoos  Neurologic: Grossly intact, no  focal deficits  Psych: Appropriate affect  Lab Results:   Basename  03/01/12 0529  02/29/12 1528   NA  136  136   K  3.3*  3.2*   CL  103  102   CO2  23  21   GLUCOSE  125*  109*   BUN  6  5*   CREATININE  0.68  0.53   CALCIUM  8.7  9.0   MG  1.8  --   PHOS  --  --     Basename  03/01/12 0529  02/29/12 1528   AST  28  32   ALT  15  20   ALKPHOS  68  75   BILITOT  2.7*  4.0*   PROT  7.0  7.9   ALBUMIN  3.7  4.0    No results found for this basename: LIPASE:2,AMYLASE:2 in the last 72 hours   Basename  03/01/12 0529  02/29/12 1528   WBC  16.2*  17.2*   NEUTROABS  9.5*  12.9*   HGB  7.1*  7.6*   HCT  20.7*  22.4*   MCV  91.6  91.8   PLT  485*  534*    No results found for this basename: CKTOTAL:3,CKMB:3,CKMBINDEX:3,TROPONINI:3 in the last 72 hours  No components found with this basename: POCBNP:3  No results found for this basename: DDIMER:2 in the last 72 hours  No results found for this basename: HGBA1C:2 in the last 72 hours  No results found for this basename: CHOL:2,HDL:2,LDLCALC:2,TRIG:2,CHOLHDL:2,LDLDIRECT:2 in the last 72 hours  No results found for this basename: TSH,T4TOTAL,FREET3,T3FREE,THYROIDAB in the last 72 hours   Basename  02/29/12 1528   VITAMINB12  --   FOLATE  --   FERRITIN  --   TIBC  --   IRON  --   RETICCTPCT  19.6*    Micro Results:  No results found for this or any previous visit (from the past 240 hour(s)).  Studies/Results:  No results found.  Medications:}  Scheduled Meds:  .  folic acid  1 mg  Oral  Daily   .  hydroxyurea  500 mg  Oral  BID   .  ketorolac  30 mg  Intravenous  Q6H   .  lisinopril  10 mg  Oral  Q2200   .  OLANZapine  10 mg  Oral  QHS   .  OxyCODONE  80 mg  Oral  Q12H   .  polyethylene glycol  17 g  Oral  QHS   .  potassium chloride  40 mEq  Oral  Q2H   .  senna-docusate  1 tablet  Oral  BID    Continuous Infusions:  .  dextrose 5 % and 0.45% NaCl  125 mL/hr at 03/01/12 0247    PRN Meds:.diphenhydrAMINE,  diphenhydrAMINE, HYDROmorphone (DILAUDID) injection, ondansetron (ZOFRAN) IV, ondansetron, zolpidem  Assessment/Plan:  Active Problems:   Hypoxia/ hx of PE with exertion: transferred to ED for further evaluation Dr. Meryle Ready Accepting physician  Vaso occlusive crisis: with active hemolysis receiving IV hydration, diphenhydramine for diphenhydrAMINE, HYDROmorphone (DILAUDID) injection, ondansetron (ZOFRAN) IV, ondansetron, zolpidem  Hypokalemia: replacing with oral supplements   [name] P  03/01/2012, 9:58 AM

## 2012-03-10 ENCOUNTER — Emergency Department (HOSPITAL_COMMUNITY)
Admission: EM | Admit: 2012-03-10 | Discharge: 2012-03-10 | Disposition: A | Discharge status: Home / Self Care | Payer: Medicare Other | Attending: Emergency Medicine | Admitting: Emergency Medicine

## 2012-03-10 ENCOUNTER — Encounter (HOSPITAL_COMMUNITY): Payer: Self-pay | Admitting: *Deleted

## 2012-03-10 DIAGNOSIS — D5700 Hb-SS disease with crisis, unspecified: Secondary | ICD-10-CM | POA: Diagnosis not present

## 2012-03-10 DIAGNOSIS — F39 Unspecified mood [affective] disorder: Secondary | ICD-10-CM | POA: Diagnosis not present

## 2012-03-10 DIAGNOSIS — E876 Hypokalemia: Secondary | ICD-10-CM | POA: Insufficient documentation

## 2012-03-10 DIAGNOSIS — Z86711 Personal history of pulmonary embolism: Secondary | ICD-10-CM | POA: Diagnosis not present

## 2012-03-10 DIAGNOSIS — Z96649 Presence of unspecified artificial hip joint: Secondary | ICD-10-CM | POA: Insufficient documentation

## 2012-03-10 DIAGNOSIS — D72829 Elevated white blood cell count, unspecified: Secondary | ICD-10-CM | POA: Insufficient documentation

## 2012-03-10 DIAGNOSIS — M2550 Pain in unspecified joint: Secondary | ICD-10-CM | POA: Insufficient documentation

## 2012-03-10 DIAGNOSIS — Z79899 Other long term (current) drug therapy: Secondary | ICD-10-CM | POA: Insufficient documentation

## 2012-03-10 DIAGNOSIS — I10 Essential (primary) hypertension: Secondary | ICD-10-CM | POA: Diagnosis not present

## 2012-03-10 DIAGNOSIS — Z7901 Long term (current) use of anticoagulants: Secondary | ICD-10-CM | POA: Diagnosis not present

## 2012-03-10 DIAGNOSIS — Z87891 Personal history of nicotine dependence: Secondary | ICD-10-CM | POA: Insufficient documentation

## 2012-03-10 DIAGNOSIS — D571 Sickle-cell disease without crisis: Secondary | ICD-10-CM | POA: Diagnosis not present

## 2012-03-10 DIAGNOSIS — Z8739 Personal history of other diseases of the musculoskeletal system and connective tissue: Secondary | ICD-10-CM | POA: Insufficient documentation

## 2012-03-10 DIAGNOSIS — D473 Essential (hemorrhagic) thrombocythemia: Secondary | ICD-10-CM | POA: Diagnosis not present

## 2012-03-10 DIAGNOSIS — Z86718 Personal history of other venous thrombosis and embolism: Secondary | ICD-10-CM | POA: Insufficient documentation

## 2012-03-10 LAB — COMPREHENSIVE METABOLIC PANEL
BUN: 4 mg/dL — ABNORMAL LOW (ref 6–23)
Calcium: 8.9 mg/dL (ref 8.4–10.5)
GFR calc Af Amer: >90 mL/min (ref >90)
Glucose, Bld: 105 mg/dL — ABNORMAL HIGH (ref 70–99)
Sodium: 134 mEq/L — ABNORMAL LOW (ref 135–145)
Total Protein: 7.5 g/dL (ref 6.0–8.3)

## 2012-03-10 LAB — CBC
HCT: 21.8 % — ABNORMAL LOW (ref 39.0–52.0)
Hemoglobin: 7.2 g/dL — ABNORMAL LOW (ref 13.0–17.0)
MCH: 30.5 pg (ref 26.0–34.0)
MCHC: 33 g/dL (ref 30.0–36.0)
RDW: 20.3 % — ABNORMAL HIGH (ref 11.5–15.5)

## 2012-03-10 LAB — RETICULOCYTES
RBC.: 2.36 MIL/uL — ABNORMAL LOW (ref 4.22–5.81)
Retic Ct Pct: 8.7 % — ABNORMAL HIGH (ref 0.4–3.1)

## 2012-03-10 LAB — PROTIME-INR: Prothrombin Time: 21.7 seconds — ABNORMAL HIGH (ref 11.6–15.2)

## 2012-03-10 MED ORDER — HYDROMORPHONE HCL PF 1 MG/ML IJ SOLN
1.0000 mg | Freq: Once | INTRAMUSCULAR | Status: AC
  Administered 2012-03-10: 1 mg via INTRAVENOUS
  Filled 2012-03-10: qty 1

## 2012-03-10 MED ORDER — HYDROMORPHONE HCL PF 1 MG/ML IJ SOLN
2.0000 mg | Freq: Once | INTRAMUSCULAR | Status: AC
  Administered 2012-03-10: 2 mg via INTRAVENOUS
  Filled 2012-03-10: qty 2

## 2012-03-10 MED ORDER — SODIUM CHLORIDE 0.9 % IV BOLUS (SEPSIS)
500.0000 mL | Freq: Once | INTRAVENOUS | Status: AC
  Administered 2012-03-10: 500 mL via INTRAVENOUS

## 2012-03-10 MED ORDER — ONDANSETRON HCL 4 MG/2ML IJ SOLN
4.0000 mg | Freq: Once | INTRAMUSCULAR | Status: AC
  Administered 2012-03-10: 4 mg via INTRAVENOUS
  Filled 2012-03-10: qty 2

## 2012-03-10 NOTE — ED Notes (Signed)
Pt reports sickle cell pain crisis started this morning 0300, pain in ribs and thighs 8/10. Pt denies n/v.

## 2012-03-10 NOTE — ED Provider Notes (Signed)
History     CSN: [id number]  Arrival date & time 03/10/12  1003   First MD Initiated Contact with Patient 03/10/12 1044      Chief Complaint  Patient presents with  . Sickle Cell Pain Crisis    (Consider location/radiation/quality/duration/timing/severity/associated sxs/prior treatment) Patient is a 32 y.o. male presenting with sickle cell pain. The history is provided by the patient.  Sickle Cell Pain Crisis  Pertinent negatives include no chest pain, no abdominal pain, no headaches, no neck pain, no rash and no eye redness.  pt c/o 'sickle cell pain', bilateral hips and thighs, low back. States pain constant, mod-severe. Took his usual home pain meds without relief. Denies fall or injury. No swelling. Denies chest pain or trouble breathing. No cough or uri symptoms. No abd pain. No nvd. No faintness or lightheadedness. Denies fever or chills.      Past Medical History  Diagnosis Date  . Sickle cell anemia   . Blood transfusion   . 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 Hypokalemia   . Mood disorder   . Pulmonary embolism   . Avascular necrosis   . Leukocytosis     Chronic  . Thrombocytosis     Chronic  . Hypertension     Past Surgical History  Procedure Laterality Date  . Right hip replacement      08/2006  . Cholecystectomy      01/2008  . Porta cath placement    . Porta cath removal    . Umbilical hernia repair      01/2008  . Excision of left periauricular cyst      10/2009  . Excision of right ear lobe cyst with primary closur      11/2007  . Portacath placement  01/05/2012    Procedure: INSERTION PORT-A-CATH;  Surgeon: Adolph Pollack, MD;  Location: [facility] OR;  Service: General;  Laterality: N/A;  ultrasound guiced port a cath insertion with fluoroscopy    Family History  Problem Relation Age of Onset  . Sickle cell anemia Mother   . Sickle cell anemia Father   . Sickle cell trait Brother     History  Substance Use Topics  .  Smoking status: Former Smoker -- 13 years    Types: Cigarettes    Quit date: 07/08/2010  . Smokeless tobacco: Never Used  . Alcohol Use: No      Review of Systems  Constitutional: Negative for fever.  HENT: Negative for neck pain.   Eyes: Negative for redness.  Respiratory: Negative for shortness of breath.   Cardiovascular: Negative for chest pain.  Gastrointestinal: Negative for abdominal pain.  Genitourinary: Negative for flank pain.  Musculoskeletal: Positive for arthralgias.  Skin: Negative for rash.  Neurological: Negative for headaches.  Hematological: Does not bruise/bleed easily.  Psychiatric/Behavioral: Negative for confusion.    Allergies  Morphine and related  Home Medications   Current Outpatient Rx  Name  Route  Sig  Dispense  Refill  . enoxaparin (LOVENOX) 120 MG/0.8ML injection   Subcutaneous   Inject 0.76 mLs (115 mg total) into the skin daily.   3 Syringe   1   . folic acid (FOLVITE) 1 MG tablet   Oral   Take 1 tablet (1 mg total) by mouth daily.   100 tablet   2   . HYDROmorphone (DILAUDID) 4 MG tablet   Oral   Take 1-2 tablets (4-8 mg total) by mouth every 4 (four)  hours as needed for pain. For pain.   60 tablet   0   . hydroxyurea (HYDREA) 500 MG capsule   Oral   Take 1 capsule (500 mg total) by mouth 2 (two) times daily. May take with food to minimize GI side effects.   60 capsule   2   . ibuprofen (ADVIL,MOTRIN) 800 MG tablet   Oral   Take 1 tablet (800 mg total) by mouth every 8 (eight) hours as needed for pain. Do not exceed use for more than 20/30 days   30 tablet   0   . lisinopril (PRINIVIL,ZESTRIL) 10 MG tablet   Oral   Take 1 tablet (10 mg total) by mouth daily at 10 pm.   30 tablet   2   . OLANZapine (ZYPREXA) 10 MG tablet   Oral   Take 10 mg by mouth at bedtime.         Marland Kitchen oxyCODONE (OXYCONTIN) 80 MG 12 hr tablet   Oral   Take 1 tablet (80 mg total) by mouth every 12 (twelve) hours.   60 tablet   0   .  potassium chloride SA (K-DUR,KLOR-CON) 20 MEQ tablet   Oral   Take 1 tablet (20 mEq total) by mouth 2 (two) times daily.   60 tablet   1   . warfarin (COUMADIN) 5 MG tablet   Oral   Take 5-7.5 mg by mouth daily. Takes 5 mg daily except Monday Wednesday and Friday he takes 7.5 mg         . zolpidem (AMBIEN) 10 MG tablet   Oral   Take 1 tablet (10 mg total) by mouth at bedtime as needed. For sleep   30 tablet   2     BP 106/68  Pulse 92  Temp(Src) 99 F (37.2 C) (Oral)  Resp 16  SpO2 97%  Physical Exam  Nursing note and vitals reviewed. Constitutional: He is oriented to person, place, and time. He appears well-developed and well-nourished. No distress.  HENT:  Head: Atraumatic.  Eyes: Conjunctivae are normal.  Neck: Neck supple. No tracheal deviation present.  Cardiovascular: Normal rate, regular rhythm, normal heart sounds and intact distal pulses.   Pulmonary/Chest: Effort normal and breath sounds normal. No accessory muscle usage. No respiratory distress.  Port left chest without sign of infection.   Abdominal: Soft. He exhibits no distension. There is no tenderness.  Genitourinary:  No cva tenderness  Musculoskeletal: Normal range of motion. He exhibits no edema and no tenderness.  tls spine non tender. Good rom bil ext. No edema. Distal pulses palp.   Neurological: He is alert and oriented to person, place, and time.  Skin: Skin is warm and dry.  Psychiatric: He has a normal mood and affect.    ED Course  Procedures (including critical care time)   Results for orders placed during the hospital encounter of 03/10/12  CBC      Result Value Range   WBC 14.5 (*) 4.0 - 10.5 K/uL   RBC 2.36 (*) 4.22 - 5.81 MIL/uL   Hemoglobin 7.2 (*) 13.0 - 17.0 g/dL   HCT 45.4 (*) 09.8 - 11.9 %   MCV 92.4  78.0 - 100.0 fL   MCH 30.5  26.0 - 34.0 pg   MCHC 33.0  30.0 - 36.0 g/dL   RDW 14.7 (*) 82.9 - 56.2 %   Platelets 601 (*) 150 - 400 K/uL  COMPREHENSIVE METABOLIC PANEL       Result  Value Range   Sodium 134 (*) 135 - 145 mEq/L   Potassium 3.5  3.5 - 5.1 mEq/L   Chloride 101  96 - 112 mEq/L   CO2 23  19 - 32 mEq/L   Glucose, Bld 105 (*) 70 - 99 mg/dL   BUN 4 (*) 6 - 23 mg/dL   Creatinine, Ser 1.61 (*) 0.50 - 1.35 mg/dL   Calcium 8.9  8.4 - 09.6 mg/dL   Total Protein 7.5  6.0 - 8.3 g/dL   Albumin 4.0  3.5 - 5.2 g/dL   AST 35  0 - 37 U/L   ALT 19  0 - 53 U/L   Alkaline Phosphatase 69  39 - 117 U/L   Total Bilirubin 3.3 (*) 0.3 - 1.2 mg/dL   GFR calc non Af Amer >90  >90 mL/min   GFR calc Af Amer >90  >90 mL/min  PROTIME-INR      Result Value Range   Prothrombin Time 21.7 (*) 11.6 - 15.2 seconds   INR 1.98 (*) 0.00 - 1.49  RETICULOCYTES      Result Value Range   Retic Ct Pct 8.7 (*) 0.4 - 3.1 %   RBC. 2.36 (*) 4.22 - 5.81 MIL/uL   Retic Count, Manual 205.3 (*) 19.0 - 186.0 K/uL      MDM  Iv ns 500 cc. o2 [place]. Labs.  Dilaudid 2 mg iv. zofran iv.  Reviewed nursing notes and prior charts for additional history.    Date: 03/10/2012  Rate: 79  Rhythm: normal sinus rhythm  QRS Axis: left  Intervals: normal  ST/T Wave abnormalities: normal  Conduction Disutrbances:left anterior fascicular block  Narrative Interpretation:   Old EKG Reviewed: unchanged  Recheck appears more comfortable, pt states pain better but persists.  Dilaudid 2 mg iv.  Recheck pt comfortable. Discussed/offered admission vs d/c.  Pt states pain improving feels will be able to go home, prefers to go home.   Dilaudid iv.  Recheck pt comfortable. Using phone. No distress or apparent discomfort.   Appears stable for d/c.       Suzi Roots, MD 03/10/12 [phone]

## 2012-03-13 IMAGING — CR DG CHEST 2V
2 series · 2 of 2 positions shown · non-contrast
Comparison: 03/19/2009

CLINICAL DATA: Sickle cell crisis, chest pain

CHEST - 2 VIEW

[view not recorded (1 of 2)]
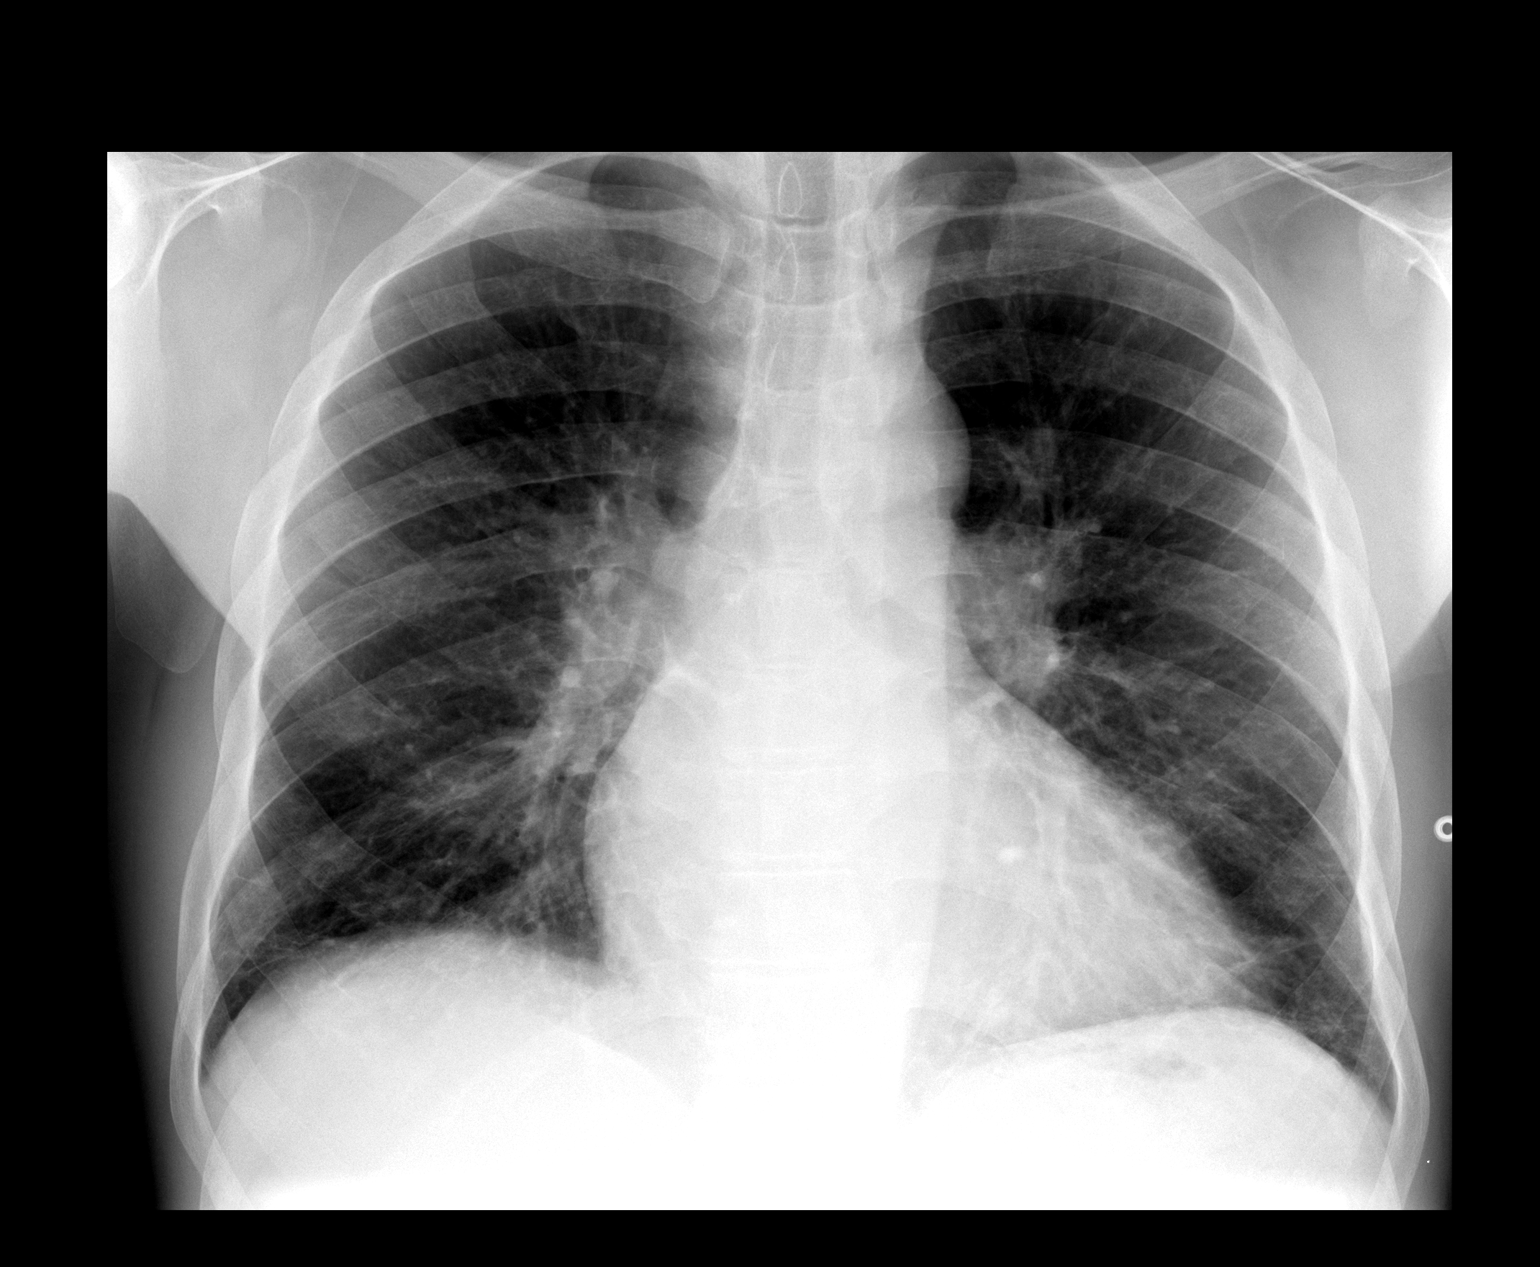

[view not recorded (2 of 2)]
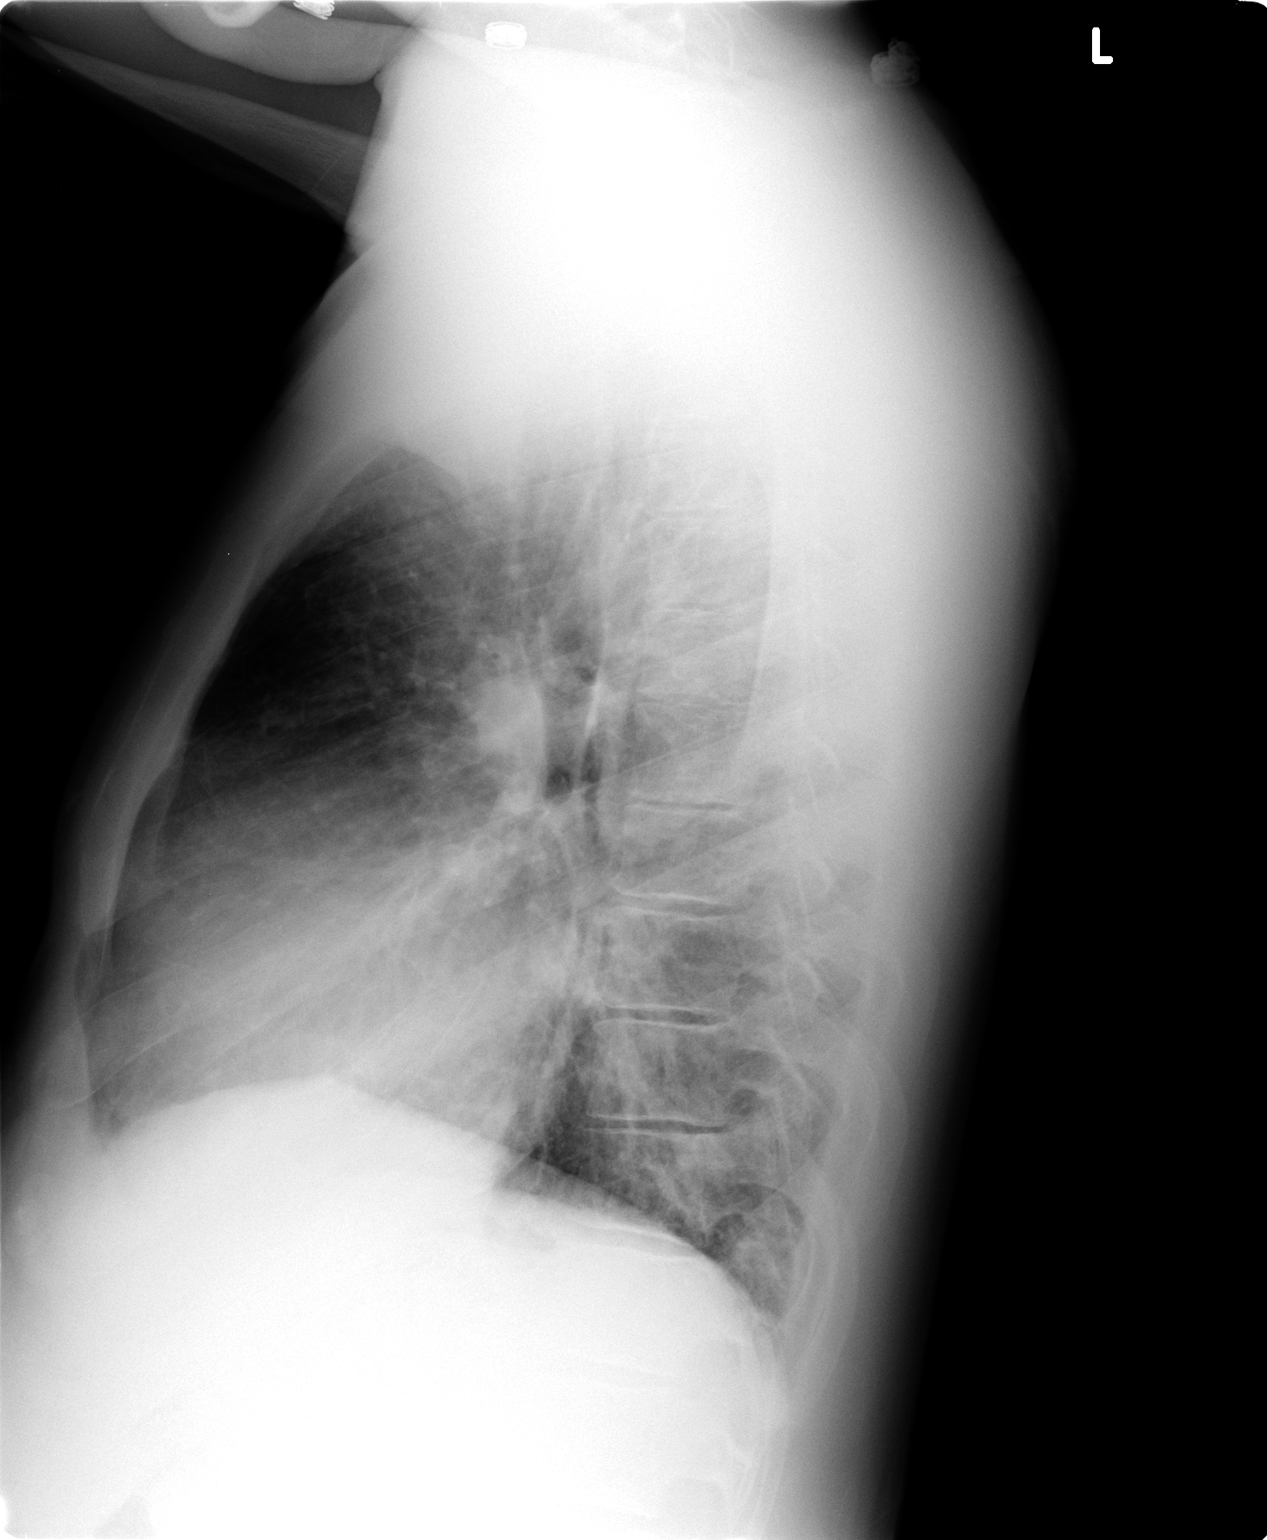

[2 of 2 positions shown; findings below may reference images not displayed]

FINDINGS: Cardiac enlargement with normal pulmonary vascular congestion,
unchanged.
Slight hilar prominence stable.
Chronic bronchitic changes.
Scattered interstitial prominence stable.
Minimal bibasilar atelectasis.
No definite acute infiltrate or pleural effusion.
No acute bony findings.
IMPRESSION: Bibasilar atelectasis.
Chronic bronchitic and persistent interstitial changes without
definite acute infiltrate.

## 2012-03-13 NOTE — Telephone Encounter (Signed)
Close encounter 

## 2012-03-17 ENCOUNTER — Emergency Department (HOSPITAL_COMMUNITY): Payer: Medicare Other

## 2012-03-17 ENCOUNTER — Encounter (HOSPITAL_COMMUNITY): Payer: Self-pay | Admitting: Emergency Medicine

## 2012-03-17 ENCOUNTER — Inpatient Hospital Stay (HOSPITAL_COMMUNITY)
Admission: EM | Admit: 2012-03-17 | Discharge: 2012-03-20 | DRG: 811 | Disposition: A | Discharge status: Home / Self Care | Payer: Medicare Other | Attending: Internal Medicine | Admitting: Internal Medicine

## 2012-03-17 DIAGNOSIS — M8700 Idiopathic aseptic necrosis of unspecified bone: Secondary | ICD-10-CM

## 2012-03-17 DIAGNOSIS — R079 Chest pain, unspecified: Secondary | ICD-10-CM | POA: Diagnosis not present

## 2012-03-17 DIAGNOSIS — D473 Essential (hemorrhagic) thrombocythemia: Secondary | ICD-10-CM | POA: Diagnosis not present

## 2012-03-17 DIAGNOSIS — D5700 Hb-SS disease with crisis, unspecified: Principal | ICD-10-CM

## 2012-03-17 DIAGNOSIS — I82C19 Acute embolism and thrombosis of unspecified internal jugular vein: Secondary | ICD-10-CM | POA: Diagnosis not present

## 2012-03-17 DIAGNOSIS — D571 Sickle-cell disease without crisis: Secondary | ICD-10-CM

## 2012-03-17 DIAGNOSIS — I2699 Other pulmonary embolism without acute cor pulmonale: Secondary | ICD-10-CM

## 2012-03-17 DIAGNOSIS — D638 Anemia in other chronic diseases classified elsewhere: Secondary | ICD-10-CM | POA: Diagnosis not present

## 2012-03-17 DIAGNOSIS — F39 Unspecified mood [affective] disorder: Secondary | ICD-10-CM

## 2012-03-17 DIAGNOSIS — G8929 Other chronic pain: Secondary | ICD-10-CM | POA: Diagnosis present

## 2012-03-17 DIAGNOSIS — E876 Hypokalemia: Secondary | ICD-10-CM

## 2012-03-17 DIAGNOSIS — M79609 Pain in unspecified limb: Secondary | ICD-10-CM | POA: Diagnosis not present

## 2012-03-17 DIAGNOSIS — I10 Essential (primary) hypertension: Secondary | ICD-10-CM | POA: Diagnosis not present

## 2012-03-17 DIAGNOSIS — D72829 Elevated white blood cell count, unspecified: Secondary | ICD-10-CM | POA: Diagnosis not present

## 2012-03-17 DIAGNOSIS — I82C11 Acute embolism and thrombosis of right internal jugular vein: Secondary | ICD-10-CM

## 2012-03-17 DIAGNOSIS — M549 Dorsalgia, unspecified: Secondary | ICD-10-CM | POA: Diagnosis not present

## 2012-03-17 LAB — HEPATIC FUNCTION PANEL
Alkaline Phosphatase: 80 U/L (ref 39–117)
Bilirubin, Direct: 0.3 mg/dL (ref 0.0–0.3)
Total Bilirubin: 4.2 mg/dL — ABNORMAL HIGH (ref 0.3–1.2)

## 2012-03-17 LAB — CBC WITH DIFFERENTIAL/PLATELET
Basophils Relative: 0 % (ref 0–1)
Eosinophils Absolute: 1.8 10*3/uL — ABNORMAL HIGH (ref 0.0–0.7)
MCH: 31.9 pg (ref 26.0–34.0)
MCHC: 34.2 g/dL (ref 30.0–36.0)
Neutrophils Relative %: 59 % (ref 43–77)
Platelets: 491 10*3/uL — ABNORMAL HIGH (ref 150–400)
RDW: 22 % — ABNORMAL HIGH (ref 11.5–15.5)

## 2012-03-17 LAB — URINALYSIS, ROUTINE W REFLEX MICROSCOPIC
Nitrite: NEGATIVE
pH: 6 (ref 5.0–8.0)

## 2012-03-17 LAB — BASIC METABOLIC PANEL
BUN: 5 mg/dL — ABNORMAL LOW (ref 6–23)
GFR calc Af Amer: >90 mL/min (ref >90)
GFR calc non Af Amer: >90 mL/min (ref >90)
Potassium: 3 mEq/L — ABNORMAL LOW (ref 3.5–5.1)
Sodium: 136 mEq/L (ref 135–145)

## 2012-03-17 LAB — RETICULOCYTES: Retic Count, Absolute: 447.1 10*3/uL — ABNORMAL HIGH (ref 19.0–186.0)

## 2012-03-17 MED ORDER — HYDROMORPHONE HCL PF 1 MG/ML IJ SOLN
1.0000 mg | Freq: Once | INTRAMUSCULAR | Status: AC
  Administered 2012-03-17: 1 mg via INTRAVENOUS
  Filled 2012-03-17: qty 1

## 2012-03-17 MED ORDER — WARFARIN - PHARMACIST DOSING INPATIENT: Freq: Every day | Status: DC

## 2012-03-17 MED ORDER — WARFARIN SODIUM 7.5 MG PO TABS
7.5000 mg | ORAL_TABLET | Freq: Once | ORAL | Status: AC
  Administered 2012-03-17: 7.5 mg via ORAL
  Filled 2012-03-17: qty 1

## 2012-03-17 MED ORDER — HYDROXYUREA 500 MG PO CAPS
500.0000 mg | ORAL_CAPSULE | Freq: Two times a day (BID) | ORAL | Status: DC
  Administered 2012-03-17 – 2012-03-20 (×7): 500 mg via ORAL
  Filled 2012-03-17 (×9): qty 1

## 2012-03-17 MED ORDER — IBUPROFEN 800 MG PO TABS
800.0000 mg | ORAL_TABLET | Freq: Three times a day (TID) | ORAL | Status: DC
  Administered 2012-03-17 – 2012-03-19 (×6): 800 mg via ORAL
  Filled 2012-03-17 (×8): qty 1

## 2012-03-17 MED ORDER — OXYCODONE HCL ER 40 MG PO T12A
80.0000 mg | EXTENDED_RELEASE_TABLET | Freq: Two times a day (BID) | ORAL | Status: DC
  Administered 2012-03-17 (×2): 80 mg via ORAL
  Filled 2012-03-17 (×2): qty 2

## 2012-03-17 MED ORDER — PANTOPRAZOLE SODIUM 40 MG IV SOLR
40.0000 mg | Freq: Every day | INTRAVENOUS | Status: DC
  Filled 2012-03-17 (×4): qty 40

## 2012-03-17 MED ORDER — POTASSIUM CHLORIDE CRYS ER 20 MEQ PO TBCR
40.0000 meq | EXTENDED_RELEASE_TABLET | Freq: Once | ORAL | Status: AC
  Administered 2012-03-17: 40 meq via ORAL
  Filled 2012-03-17: qty 2

## 2012-03-17 MED ORDER — LISINOPRIL 10 MG PO TABS
10.0000 mg | ORAL_TABLET | Freq: Every day | ORAL | Status: DC
  Administered 2012-03-17 – 2012-03-19 (×3): 10 mg via ORAL
  Filled 2012-03-17 (×4): qty 1

## 2012-03-17 MED ORDER — DIPHENHYDRAMINE HCL 50 MG/ML IJ SOLN
25.0000 mg | Freq: Once | INTRAMUSCULAR | Status: AC
  Administered 2012-03-17: 25 mg via INTRAVENOUS
  Filled 2012-03-17: qty 1

## 2012-03-17 MED ORDER — ALUM & MAG HYDROXIDE-SIMETH 200-200-20 MG/5ML PO SUSP: 15.0000 mL | ORAL | Status: DC | PRN

## 2012-03-17 MED ORDER — FOLIC ACID 1 MG PO TABS
1.0000 mg | ORAL_TABLET | Freq: Every day | ORAL | Status: DC
  Administered 2012-03-17 – 2012-03-20 (×4): 1 mg via ORAL
  Filled 2012-03-17 (×4): qty 1

## 2012-03-17 MED ORDER — ONDANSETRON HCL 4 MG/2ML IJ SOLN
4.0000 mg | Freq: Once | INTRAMUSCULAR | Status: AC
  Administered 2012-03-17: 4 mg via INTRAVENOUS
  Filled 2012-03-17: qty 2

## 2012-03-17 MED ORDER — SENNA 8.6 MG PO TABS
1.0000 | ORAL_TABLET | Freq: Two times a day (BID) | ORAL | Status: DC
  Administered 2012-03-17 – 2012-03-20 (×6): 8.6 mg via ORAL
  Filled 2012-03-17 (×6): qty 1

## 2012-03-17 MED ORDER — PANTOPRAZOLE SODIUM 40 MG PO TBEC
40.0000 mg | DELAYED_RELEASE_TABLET | Freq: Every day | ORAL | Status: DC
  Administered 2012-03-17 – 2012-03-20 (×4): 40 mg via ORAL
  Filled 2012-03-17 (×4): qty 1

## 2012-03-17 MED ORDER — HYDROMORPHONE HCL 4 MG PO TABS
4.0000 mg | ORAL_TABLET | ORAL | Status: DC
  Administered 2012-03-17: 4 mg via ORAL
  Filled 2012-03-17: qty 1

## 2012-03-17 MED ORDER — HYDROMORPHONE HCL 4 MG PO TABS
8.0000 mg | ORAL_TABLET | ORAL | Status: DC
  Administered 2012-03-17 – 2012-03-18 (×4): 8 mg via ORAL
  Filled 2012-03-17 (×4): qty 2

## 2012-03-17 MED ORDER — SODIUM CHLORIDE 0.9 % IV BOLUS (SEPSIS)
1000.0000 mL | Freq: Once | INTRAVENOUS | Status: AC
  Administered 2012-03-17: 1000 mL via INTRAVENOUS

## 2012-03-17 MED ORDER — ENOXAPARIN SODIUM 80 MG/0.8ML ~~LOC~~ SOLN
80.0000 mg | Freq: Two times a day (BID) | SUBCUTANEOUS | Status: DC
  Administered 2012-03-17 – 2012-03-18 (×4): 80 mg via SUBCUTANEOUS
  Filled 2012-03-17 (×6): qty 0.8

## 2012-03-17 MED ORDER — OLANZAPINE 10 MG PO TABS
10.0000 mg | ORAL_TABLET | Freq: Every day | ORAL | Status: DC
  Administered 2012-03-17 – 2012-03-19 (×3): 10 mg via ORAL
  Filled 2012-03-17 (×4): qty 1

## 2012-03-17 MED ORDER — ZOLPIDEM TARTRATE 10 MG PO TABS
10.0000 mg | ORAL_TABLET | Freq: Every evening | ORAL | Status: DC | PRN
  Administered 2012-03-18: 10 mg via ORAL
  Filled 2012-03-17: qty 1

## 2012-03-17 MED ORDER — DOCUSATE SODIUM 100 MG PO CAPS
100.0000 mg | ORAL_CAPSULE | Freq: Two times a day (BID) | ORAL | Status: DC
  Administered 2012-03-17 – 2012-03-20 (×7): 100 mg via ORAL
  Filled 2012-03-17 (×8): qty 1

## 2012-03-17 MED ORDER — SODIUM CHLORIDE 0.9 % IV SOLN
INTRAVENOUS | Status: DC
  Administered 2012-03-17 – 2012-03-19 (×3): via INTRAVENOUS

## 2012-03-17 MED ORDER — PROMETHAZINE HCL 25 MG/ML IJ SOLN
12.5000 mg | INTRAMUSCULAR | Status: DC | PRN
  Administered 2012-03-17 – 2012-03-20 (×15): 25 mg via INTRAVENOUS
  Filled 2012-03-17 (×15): qty 1

## 2012-03-17 MED ORDER — POTASSIUM CHLORIDE CRYS ER 20 MEQ PO TBCR
20.0000 meq | EXTENDED_RELEASE_TABLET | Freq: Two times a day (BID) | ORAL | Status: DC
  Administered 2012-03-17 – 2012-03-20 (×7): 20 meq via ORAL
  Filled 2012-03-17 (×8): qty 1

## 2012-03-17 MED ORDER — HYDROMORPHONE HCL PF 2 MG/ML IJ SOLN
2.0000 mg | INTRAMUSCULAR | Status: DC | PRN
  Administered 2012-03-17 – 2012-03-18 (×9): 2 mg via INTRAVENOUS
  Filled 2012-03-17 (×10): qty 1

## 2012-03-17 MED ORDER — PROMETHAZINE HCL 25 MG PO TABS
12.5000 mg | ORAL_TABLET | ORAL | Status: DC | PRN
  Administered 2012-03-17: 25 mg via ORAL
  Filled 2012-03-17 (×2): qty 1

## 2012-03-17 MED ORDER — PROMETHAZINE HCL 25 MG RE SUPP: 12.5000 mg | RECTAL | Status: DC | PRN

## 2012-03-17 MED ORDER — DIPHENHYDRAMINE HCL 25 MG PO CAPS
25.0000 mg | ORAL_CAPSULE | Freq: Four times a day (QID) | ORAL | Status: DC | PRN
  Administered 2012-03-17 – 2012-03-20 (×8): 25 mg via ORAL
  Filled 2012-03-17 (×9): qty 1

## 2012-03-17 NOTE — ED Notes (Signed)
MD Informed of lab value

## 2012-03-17 NOTE — ED Notes (Signed)
Spoke with lab and they will run a hepatic function panel from blood sent down today.

## 2012-03-17 NOTE — ED Notes (Signed)
Pt aware of the need for a urine sample. Urinal at bedside. 

## 2012-03-17 NOTE — ED Notes (Signed)
Patient reports bilateral flank and leg pain x 2 days.  Patient has h/o sickle cell.

## 2012-03-17 NOTE — ED Notes (Addendum)
Received order from Hospitalist MD to transport pt off of telemetry.

## 2012-03-17 NOTE — ED Provider Notes (Signed)
History     CSN: [id number]  Arrival date & time 03/17/12  0827   First MD Initiated Contact with Patient 03/17/12 [phone]      Chief Complaint  Patient presents with  . Sickle Cell Pain Crisis    (Consider location/radiation/quality/duration/timing/severity/associated sxs/prior treatment) The history is provided by the patient.  [name] is a 32 y.o. male  History of sickle cell anemia,  Pulmonary embolus on Coumadin here presenting with bilateral flank pain and leg pain. Symptoms going on for the last 2 days. Is crampy and diffuse it is not associated with abdominal pain but did have one episode of vomiting. No dysuria or hematuria or history of kidney stones. He took his dialogue it and OxyContin without any improvement.  Denies any fevers or chills. He had several admissions this past year was recently here a week ago and was discharged home after pain meds. No chest pain or SOB. No hx of acute chest.    Past Medical History  Diagnosis Date  . Sickle cell anemia   . Blood transfusion   . 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 Hypokalemia   . Mood disorder   . Pulmonary embolism   . Avascular necrosis   . Leukocytosis     Chronic  . Thrombocytosis     Chronic  . Hypertension     Past Surgical History  Procedure Laterality Date  . Right hip replacement      08/2006  . Cholecystectomy      01/2008  . Porta cath placement    . Porta cath removal    . Umbilical hernia repair      01/2008  . Excision of left periauricular cyst      10/2009  . Excision of right ear lobe cyst with primary closur      11/2007  . Portacath placement  01/05/2012    Procedure: INSERTION PORT-A-CATH;  Surgeon: Adolph Pollack, MD;  Location: [facility] OR;  Service: General;  Laterality: N/A;  ultrasound guiced port a cath insertion with fluoroscopy    Family History  Problem Relation Age of Onset  . Sickle cell anemia Mother   . Sickle cell anemia Father   .  Sickle cell trait Brother     History  Substance Use Topics  . Smoking status: Former Smoker -- 13 years    Types: Cigarettes    Quit date: 07/08/2010  . Smokeless tobacco: Never Used  . Alcohol Use: No      Review of Systems  Genitourinary: Positive for flank pain.  Musculoskeletal:       Leg pain   All other systems reviewed and are negative.    Allergies  Morphine and related  Home Medications   Current Outpatient Rx  Name  Route  Sig  Dispense  Refill  . folic acid (FOLVITE) 1 MG tablet   Oral   Take 1 tablet (1 mg total) by mouth daily.   100 tablet   2   . HYDROmorphone (DILAUDID) 4 MG tablet   Oral   Take 1-2 tablets (4-8 mg total) by mouth every 4 (four) hours as needed for pain. For pain.   60 tablet   0   . hydroxyurea (HYDREA) 500 MG capsule   Oral   Take 1 capsule (500 mg total) by mouth 2 (two) times daily. May take with food to minimize GI side effects.   60 capsule   2   .  ibuprofen (ADVIL,MOTRIN) 800 MG tablet   Oral   Take 1 tablet (800 mg total) by mouth every 8 (eight) hours as needed for pain. Do not exceed use for more than 20/30 days   30 tablet   0   . lisinopril (PRINIVIL,ZESTRIL) 10 MG tablet   Oral   Take 1 tablet (10 mg total) by mouth daily at 10 pm.   30 tablet   2   . OLANZapine (ZYPREXA) 10 MG tablet   Oral   Take 10 mg by mouth at bedtime.         Marland Kitchen oxyCODONE (OXYCONTIN) 80 MG 12 hr tablet   Oral   Take 1 tablet (80 mg total) by mouth every 12 (twelve) hours.   60 tablet   0   . potassium chloride SA (K-DUR,KLOR-CON) 20 MEQ tablet   Oral   Take 1 tablet (20 mEq total) by mouth 2 (two) times daily.   60 tablet   1   . warfarin (COUMADIN) 5 MG tablet   Oral   Take 5-7.5 mg by mouth daily. Takes 5 mg daily except Monday Wednesday and Friday he takes 7.5 mg         . zolpidem (AMBIEN) 10 MG tablet   Oral   Take 1 tablet (10 mg total) by mouth at bedtime as needed. For sleep   30 tablet   2     BP  106/69  Pulse 62  Temp(Src) 98.1 F (36.7 C) (Oral)  Resp 16  SpO2 98%  Physical Exam  Nursing note and vitals reviewed. Constitutional: He is oriented to person, place, and time. He appears well-developed and well-nourished.  Uncomfortable   HENT:  Head: Normocephalic.  Mouth/Throat: Oropharynx is clear and moist.  Eyes: Conjunctivae are normal. Pupils are equal, round, and reactive to light.  Neck: Normal range of motion. Neck supple.  Cardiovascular: Normal rate, regular rhythm and normal heart sounds.   Pulmonary/Chest: Effort normal and breath sounds normal. No respiratory distress. He has no wheezes. He has no rales.  Abdominal: Soft. Bowel sounds are normal. He exhibits no distension. There is no tenderness. There is no rebound.  Musculoskeletal: Normal range of motion.  + paralumbar tenderness, no midline tenderness. Diffuse lower leg tenderness, but ROM of hip and knees nl.   Neurological: He is alert and oriented to person, place, and time.  Skin: Skin is warm and dry.  Psychiatric: He has a normal mood and affect. His behavior is normal. Judgment normal.    ED Course  Procedures (including critical care time)  Labs Reviewed  CBC WITH DIFFERENTIAL - Abnormal; Notable for the following:    WBC 18.0 (*)    RBC 2.16 (*)    Hemoglobin 6.9 (*)    HCT 20.2 (*)    RDW 22.0 (*)    Platelets 491 (*)    Neutro Abs 10.6 (*)    Monocytes Relative 15 (*)    Monocytes Absolute 2.7 (*)    Eosinophils Relative 10 (*)    Eosinophils Absolute 1.8 (*)    All other components within normal limits  BASIC METABOLIC PANEL - Abnormal; Notable for the following:    Potassium 3.0 (*)    Glucose, Bld 108 (*)    BUN 5 (*)    Calcium 8.2 (*)    All other components within normal limits  RETICULOCYTES - Abnormal; Notable for the following:    Retic Ct Pct 20.7 (*)    RBC. 2.16 (*)  Retic Count, Manual 447.1 (*)    All other components within normal limits  URINALYSIS, ROUTINE W  REFLEX MICROSCOPIC - Abnormal; Notable for the following:    Color, Urine AMBER (*)    Bilirubin Urine SMALL (*)    Ketones, ur TRACE (*)    Urobilinogen, UA 4.0 (*)    All other components within normal limits  PROTIME-INR - Abnormal; Notable for the following:    Prothrombin Time 20.4 (*)    INR 1.82 (*)    All other components within normal limits   No results found.   No diagnosis found.    MDM  [name] is a 32 y.o. male here with his typical crisis. Will check labs, reti, and UA. Will give pain meds and fluids and reassess.   12:01 PM Patient's labs at baseline. Ret count elevated appropriately. UA showed no hematuria. Not concerned for kidney stone. He was given dilaudid 1 mg x 2 but still has pain. I called Dr. Larina Bras, hospitalist, who accepted the patient for sickle cell crisis.         Richardean Canal, MD 03/17/12 [phone]

## 2012-03-17 NOTE — ED Notes (Signed)
Placed pt on 2L [place]; pt satting around 91-93%.  Gave Dilaudid, so provided supplemental O2.  Pt now satting 97%

## 2012-03-17 NOTE — H&P (Signed)
[facility] History and Physical  [name] [name] DOB: 03/31/1980 DOA: 03/17/2012  Referring physician:  Dr. Chaney [name] PCP:  [name] [name], MD   Chief Complaint:  pain  HPI:  The patient is a 32 y.o. 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  He states he has been feeling about the same since he was discharged from the hospital about 2 weeks ago.  He has had a visit to the sickle cell clinic 2/5 and a visit to the ER 2/8 in the meantime.  He normally takes oxycontin 80mg  BID and dilaudid 4mg  about twice a day, however, he ran out of his dilaudid 5 days ago on 2/11.  He has had 8/10 soreness of the ribs on both sides and 8/10 soreness in the bilateral thighs.  He has been taking his other pain medication at home without relief.  Nothing makes his pain worse.  His pain is constant.  He states he has been feeling well otherwise.    In the ER, he was given dilaudid 2mg  IV once without relief.  His labs were near his baseline.  He was hypokalemic to 3.0 and his INR was subtherapeutic at 1.82.  He is being admitted for pain control.    Review of Systems:  Denies fevers, chills, rhinorrhea, sore throat, sinus congestion, cough, shortness of breath, chest pain.  He had 1 episode of vomiting about 2-3 days ago, nonbilious, nonbloody, no coffee grounds.  No diarrhea.  Denies abnormal bruising or bleeding.  Denies dysuria, urgency, frequency.  Denies focal weakness, numbness, slurred speech, confusion, anxiety and depression.    Past Medical History  Diagnosis Date  . Sickle cell anemia   . Blood transfusion   . 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 Hypokalemia   . Mood disorder   . Pulmonary embolism   . Avascular necrosis   . Leukocytosis     Chronic  . Thrombocytosis     Chronic  .  Hypertension    Past Surgical History  Procedure Laterality Date  . Right hip replacement      08/2006  . Cholecystectomy      01/2008  . Porta cath placement    . Porta cath removal    . Umbilical hernia repair      01/2008  . Excision of left periauricular cyst      10/2009  . Excision of right ear lobe cyst with primary closur      11/2007  . Portacath placement  01/05/2012    Procedure: INSERTION PORT-A-CATH;  Surgeon: Gerald Pollack, MD;  Location: [facility] OR;  Service: General;  Laterality: N/A;  ultrasound guiced port a cath insertion with fluoroscopy   Social History:  reports that he quit smoking about 20 months ago. His smoking use included Cigarettes. He smoked 0.00 packs per day for 13 years. He has never used smokeless tobacco. He reports that he does not drink alcohol or use illicit drugs. Lives in an apartment.  Single.  Lives with girlfriend.  Does not use any assist devices.    Allergies  Allergen Reactions  . Morphine And Related Hives and Rash    Pt states he is able to tolerate Dilaudid with no reactions.    Family History  Problem Relation Age of Onset  .  Sickle cell anemia Mother   . Sickle cell anemia Father   . Sickle cell trait Brother     Prior to Admission medications   Medication Sig Start Date End Date Taking? Authorizing Provider  folic acid (FOLVITE) 1 MG tablet Take 1 tablet (1 mg total) by mouth daily. 01/04/12  Yes Gerald Bender, MD  HYDROmorphone (DILAUDID) 4 MG tablet Take 1-2 tablets (4-8 mg total) by mouth every 4 (four) hours as needed for pain. For pain. 03/04/12  Yes Gerald Murray, MD  hydroxyurea (HYDREA) 500 MG capsule Take 1 capsule (500 mg total) by mouth 2 (two) times daily. May take with food to minimize GI side effects. 01/04/12  Yes Gerald Bender, MD  ibuprofen (ADVIL,MOTRIN) 800 MG tablet Take 1 tablet (800 mg total) by mouth every 8 (eight) hours as needed for pain. Do not exceed use for more than 20/30 days 01/23/12  Yes Gerald Harm, MD  lisinopril (PRINIVIL,ZESTRIL) 10 MG tablet Take 1 tablet (10 mg total) by mouth daily at 10 pm. 01/04/12  Yes Gerald Bender, MD  OLANZapine (ZYPREXA) 10 MG tablet Take 10 mg by mouth at bedtime. 11/08/11  Yes Gerald Bender, MD  oxyCODONE (OXYCONTIN) 80 MG 12 hr tablet Take 1 tablet (80 mg total) by mouth every 12 (twelve) hours. 03/04/12  Yes Gerald Murray, MD  potassium chloride SA (K-DUR,KLOR-CON) 20 MEQ tablet Take 1 tablet (20 mEq total) by mouth 2 (two) times daily. 01/04/12  Yes Gerald Bender, MD  warfarin (COUMADIN) 5 MG tablet Take 5-7.5 mg by mouth daily. Takes 5 mg daily except Monday Wednesday and Friday he takes 7.5 mg 03/05/12  Yes Gerald Harm, MD  zolpidem (AMBIEN) 10 MG tablet Take 1 tablet (10 mg total) by mouth at bedtime as needed. For sleep 02/09/12  Yes Gerald Sessions, NP   Physical Exam: Filed Vitals:   03/17/12 0928 03/17/12 1118 03/17/12 1300 03/17/12 1313  BP: 111/72 106/69 109/65 117/72  Pulse: 81 62 65 63  Temp: 98.1 F (36.7 C)  98.1 F (36.7 C) 98.3 F (36.8 C)  TempSrc: Oral  Oral Oral  Resp:  16    Height:    6' (1.829 m)  Weight:    77.7 kg (171 lb 4.8 oz)  SpO2: 91% 98% 99% 99%     General:  AAM, no acute distress, lying on stretcher  Eyes:  PERRL, mild icterus, non-injected.  ENT:  OP clear, non-erythematous without plaques or exudates.  MMM.  Neck:  Supple without TM or JVD.  Lymph:  No cervical, supraclavicular, or submandibular LAD.  Cardiovascular:  RRR, nl S1 and more widely split S2, without m/r/g.  2+ pulses  Respiratory:  CTA bilaterally without increased WOB.  Abdomen:  NABS.  Soft, ND/NT.  Skin:  No rashes or focal lesions.  Musculoskeletal:  Normal bulk and tone.  No LE edema.  No pain with palpation of the stated painful areas on his chest and on his quads, even to deep palpation.    Psychiatric:  A & O x 4.    Neurologic:  CN 3-12 intact.  5/5 strength.  Sensation intact.  Labs on Admission:  Basic Metabolic  Panel:  Recent Labs Lab 03/17/12 1033  NA 136  K 3.0*  CL 104  CO2 23  GLUCOSE 108*  BUN 5*  CREATININE 0.58  CALCIUM 8.2*   Liver Function Tests:  Recent Labs Lab 03/17/12 1100  AST 37  ALT 16  ALKPHOS 80  BILITOT 4.2*  PROT 6.9  ALBUMIN 3.7   No results found for this basename: LIPASE, AMYLASE,  in the last 168 hours No results found for this basename: AMMONIA,  in the last 168 hours CBC:  Recent Labs Lab 03/17/12 1033  WBC 18.0*  NEUTROABS 10.6*  HGB 6.9*  HCT 20.2*  MCV 93.5  PLT 491*   Cardiac Enzymes: No results found for this basename: CKTOTAL, CKMB, CKMBINDEX, TROPONINI,  in the last 168 hours  BNP (last 3 results)  Recent Labs  06/07/11 0650 07/05/11 0620 07/18/11 1500  PROBNP 7.4 219.1* 13.5   CBG: No results found for this basename: GLUCAP,  in the last 168 hours  Radiological Exams on Admission: Dg Chest 2 View  03/17/2012  *RADIOLOGY REPORT*  Clinical Data: 32 year old male with chest pain - history of sickle cell disease.  CHEST - 2 VIEW  Comparison: 03/01/2012  Findings: Cardiomegaly and left-sided Port-A-Cath again noted. Mild interstitial prominence is again noted. There is no evidence of focal airspace disease, pulmonary edema, suspicious pulmonary nodule/mass, pleural effusion, or pneumothorax. No acute bony abnormalities are identified.  IMPRESSION: Cardiomegaly without evidence of acute cardiopulmonary disease   Original Report Authenticated By: Harmon Pier, M.D.     EKG:  Last ECG was from 03/10/12 and was NSR with LAFB.  Assessment/Plan Principal Problem:   Sickle cell anemia with crisis Active Problems:   Leukocytosis   Hypokalemia   Thrombocytosis   Acute embolism and thrombosis of right internal jugular vein   Secondary hemochromatosis   Mood disorder   Hypertension   Pulmonary embolism with hypoxia  Sickle cell with pain crisis:  Patient's pain is atypical in that he does not have pain with palpation.  He also states  that he has had little change in his pain since he was discharged several weeks ago.  Question whether he is returning essentially for a refill of his dilaudid.  He did not call the clinic or attempt outpatient management of his symptoms or medication refill.  Anemia near baseline.  Have been able to get pain to a 7/10 so far.   -  Admit to telemetry for now -  Continue oxycontin 80mg  po BID -  Scheduled oral dilaudid 8mg  po q4h with hold parameter for RASS <= 0 -  Add IV dilaudid 2mg  q2h prn for breakthrough pain with hold parameter for RASS <= 0 -  Scheduled ibuprofen  -  Stool softeners -  PPI for GI prophylaxis -  Repeat CMP and CBC with retic in AM -  NS at 69ml/h -  Continue folate and hydroxyurea -  Will hold off on transfusion for now as hgb near baseline.  Iatrogenic hemochromatosis:  Ferritin ~3000 -  Consider desferal infusions while inpatient  HTN, stable -  Continue lisinopril  Hypokalemia, unclear etiology, patient on ACEI  -  Replete potassium  Mood d/o, stable.  Continue zyprexa  DVT/PE.  PE diagnosed less than a month ago and INR is subtherapeutic -  Bridge back to therapeutic INR  Hyperbilirubinemia:  Baseline 2.7-4, near baseline.   - Trend  Leukocytosis:  Near baseline of 14-17.  CXR neg, UA neg Eosinophilia:  Does not appear to be having an allergic rxn Anemia, hgb baseline 6.8-7.5 and currently 6.9 with robust retic 21%.  Will hold off on transfusion for now. Thrombocytosis, baseline 400-550, currently at baseline.  Diet:  regular Access:  PIV IVF:  NS at 79ml/h Proph:  lovenox  bridge to coumadin  Code Status: full code Family Communication: spoke with patient alone Disposition Plan: pending improvement in pain.  Admit to telemetry  Time spent: 60 min  Renae Fickle [facility] Pager [phone]  If 7PM-7AM, please contact night-coverage [email or web address] Password [facility] 03/17/2012, 4:28 PM

## 2012-03-17 NOTE — Progress Notes (Signed)
ANTICOAGULATION CONSULT NOTE - Initial Consult  Pharmacy Consult for Lovenox/Warfarin Indication: Hx PE, chronic Warfarin  Allergies  Allergen Reactions  . Morphine And Related Hives and Rash    Pt states he is able to tolerate Dilaudid with no reactions.   Patient Measurements: Height: 6' (182.9 cm) Weight: 171 lb 4.8 oz (77.7 kg) IBW/kg (Calculated) : 77.6  Vital Signs: Temp: 98.3 F (36.8 C) (02/15 1313) Temp src: Oral (02/15 1313) BP: 117/72 mmHg (02/15 1313) Pulse Rate: 63 (02/15 1313)  Labs:  Recent Labs  03/17/12 1033  HGB 6.9*  HCT 20.2*  PLT 491*  LABPROT 20.4*  INR 1.82*  CREATININE 0.58   Estimated Creatinine Clearance: 145.5 ml/min (by C-G formula based on Cr of 0.58).  Medical History: Past Medical History  Diagnosis Date  . Sickle cell anemia   . Blood transfusion   . 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 Hypokalemia   . Mood disorder   . Pulmonary embolism   . Avascular necrosis   . Leukocytosis     Chronic  . Thrombocytosis     Chronic  . Hypertension    Medications:  Scheduled:  . [COMPLETED] diphenhydrAMINE  25 mg Intravenous Once  . [COMPLETED] diphenhydrAMINE  25 mg Intravenous Once  . docusate sodium  100 mg Oral BID  . folic acid  1 mg Oral Daily  . [COMPLETED]  HYDROmorphone (DILAUDID) injection  1 mg Intravenous Once  . [COMPLETED]  HYDROmorphone (DILAUDID) injection  1 mg Intravenous Once  . HYDROmorphone  4 mg Oral Q4H  . hydroxyurea  500 mg Oral BID  . ibuprofen  800 mg Oral TID  . lisinopril  10 mg Oral Q2200  . OLANZapine  10 mg Oral QHS  . [COMPLETED] ondansetron (ZOFRAN) IV  4 mg Intravenous Once  . [COMPLETED] ondansetron (ZOFRAN) IV  4 mg Intravenous Once  . OxyCODONE  80 mg Oral Q12H  . pantoprazole  40 mg Oral Daily   Or  . pantoprazole (PROTONIX) IV  40 mg Intravenous Daily  . potassium chloride SA  20 mEq Oral BID  . [COMPLETED] potassium chloride SA  40 mEq Oral Once  . senna  1  tablet Oral BID  . [COMPLETED] sodium chloride  1,000 mL Intravenous Once    Assessment: 31yoM with [place] anemia, admit in [place] crisis. Hx of PE on chronic Warfarin; home dose 7.5mg  MWF, 5mg  other days. Last Warfarin dose unknown.  Admit INR 1.82  Lovenox and Warfarin per pharmacy dosing.  Goal of Therapy:  INR 2-3 Anti-Xa level 0.6-1.2 units/ml 4hrs after LMWH dose given   Plan:  Lovenox 80mg  q12 until INR > or = 2.0 Warfarin 7.41m today Daily PT/INR  Otho Bellows PharmD Pager [phone] 03/17/2012,1:50 PM

## 2012-03-17 NOTE — ED Notes (Signed)
Resent labs

## 2012-03-17 NOTE — ED Notes (Signed)
MD at bedside; ordered pt to have a meal

## 2012-03-18 DIAGNOSIS — D5700 Hb-SS disease with crisis, unspecified: Secondary | ICD-10-CM | POA: Diagnosis not present

## 2012-03-18 DIAGNOSIS — I10 Essential (primary) hypertension: Secondary | ICD-10-CM | POA: Diagnosis not present

## 2012-03-18 DIAGNOSIS — I2699 Other pulmonary embolism without acute cor pulmonale: Secondary | ICD-10-CM | POA: Diagnosis not present

## 2012-03-18 DIAGNOSIS — D72829 Elevated white blood cell count, unspecified: Secondary | ICD-10-CM | POA: Diagnosis not present

## 2012-03-18 DIAGNOSIS — D473 Essential (hemorrhagic) thrombocythemia: Secondary | ICD-10-CM

## 2012-03-18 DIAGNOSIS — I82C19 Acute embolism and thrombosis of unspecified internal jugular vein: Secondary | ICD-10-CM | POA: Diagnosis not present

## 2012-03-18 DIAGNOSIS — E876 Hypokalemia: Secondary | ICD-10-CM | POA: Diagnosis not present

## 2012-03-18 LAB — COMPREHENSIVE METABOLIC PANEL
BUN: 6 mg/dL (ref 6–23)
CO2: 22 mEq/L (ref 19–32)
Calcium: 8.2 mg/dL — ABNORMAL LOW (ref 8.4–10.5)
Creatinine, Ser: 0.67 mg/dL (ref 0.50–1.35)
GFR calc Af Amer: >90 mL/min (ref >90)
GFR calc non Af Amer: >90 mL/min (ref >90)
Glucose, Bld: 103 mg/dL — ABNORMAL HIGH (ref 70–99)

## 2012-03-18 LAB — PROTIME-INR: INR: 2.23 — ABNORMAL HIGH (ref 0.00–1.49)

## 2012-03-18 LAB — CBC
HCT: 21.4 % — ABNORMAL LOW (ref 39.0–52.0)
Hemoglobin: 7.2 g/dL — ABNORMAL LOW (ref 13.0–17.0)
MCH: 32 pg (ref 26.0–34.0)
MCV: 95.1 fL (ref 78.0–100.0)
RBC: 2.25 MIL/uL — ABNORMAL LOW (ref 4.22–5.81)

## 2012-03-18 LAB — RETICULOCYTES: Retic Ct Pct: 20.3 % — ABNORMAL HIGH (ref 0.4–3.1)

## 2012-03-18 MED ORDER — WARFARIN SODIUM 5 MG PO TABS
5.0000 mg | ORAL_TABLET | Freq: Once | ORAL | Status: AC
  Administered 2012-03-18: 5 mg via ORAL
  Filled 2012-03-18 (×2): qty 1

## 2012-03-18 MED ORDER — DEXTROSE 5 % IV SOLN
2000.0000 mg | Freq: Every day | INTRAVENOUS | Status: DC
  Administered 2012-03-18 – 2012-03-19 (×2): 2000 mg via INTRAVENOUS
  Filled 2012-03-18 (×4): qty 2

## 2012-03-18 MED ORDER — OXYCODONE HCL ER 40 MG PO T12A
40.0000 mg | EXTENDED_RELEASE_TABLET | Freq: Two times a day (BID) | ORAL | Status: DC
  Administered 2012-03-18 – 2012-03-20 (×5): 40 mg via ORAL
  Filled 2012-03-18 (×5): qty 1

## 2012-03-18 MED ORDER — HYDROMORPHONE HCL PF 2 MG/ML IJ SOLN
2.0000 mg | INTRAMUSCULAR | Status: DC | PRN
  Administered 2012-03-18 (×2): 4 mg via INTRAVENOUS
  Administered 2012-03-18: 2 mg via INTRAVENOUS
  Administered 2012-03-18 – 2012-03-19 (×10): 4 mg via INTRAVENOUS
  Filled 2012-03-18 (×2): qty 2
  Filled 2012-03-18: qty 1
  Filled 2012-03-18 (×10): qty 2

## 2012-03-18 NOTE — Progress Notes (Signed)
ANTICOAGULATION CONSULT NOTE -Follow up  Pharmacy Consult for Lovenox/Warfarin Indication: Hx PE, chronic Warfarin  Allergies  Allergen Reactions  . Morphine And Related Hives and Rash    Pt states he is able to tolerate Dilaudid with no reactions.   Patient Measurements: Height: 6' (182.9 cm) Weight: 171 lb 4.8 oz (77.7 kg) IBW/kg (Calculated) : 77.6  Vital Signs: Temp: 98.2 F (36.8 C) (02/16 0550) Temp src: Oral (02/16 0550) BP: 115/75 mmHg (02/16 0550) Pulse Rate: 93 (02/16 0550)  Labs:  Recent Labs  03/17/12 1033 03/18/12 0500  HGB 6.9* 7.2*  HCT 20.2* 21.4*  PLT 491* 426*  LABPROT 20.4* 23.7*  INR 1.82* 2.23*  CREATININE 0.58 0.67   Estimated Creatinine Clearance: 145.5 ml/min (by C-G formula based on Cr of 0.67).  Medications:  Scheduled:  . docusate sodium  100 mg Oral BID  . enoxaparin (LOVENOX) injection  80 mg Subcutaneous BID  . folic acid  1 mg Oral Daily  . hydroxyurea  500 mg Oral BID  . ibuprofen  800 mg Oral TID  . lisinopril  10 mg Oral Q2200  . OLANZapine  10 mg Oral QHS  . OxyCODONE  40 mg Oral Q12H  . pantoprazole  40 mg Oral Daily   Or  . pantoprazole (PROTONIX) IV  40 mg Intravenous Daily  . potassium chloride SA  20 mEq Oral BID  . senna  1 tablet Oral BID  . [COMPLETED] sodium chloride  1,000 mL Intravenous Once  . [COMPLETED] warfarin  7.5 mg Oral ONCE-1800  . Warfarin - Pharmacist Dosing Inpatient   Does not apply q1800  . [DISCONTINUED] HYDROmorphone  4 mg Oral Q4H  . [DISCONTINUED] HYDROmorphone  8 mg Oral Q4H  . [DISCONTINUED] OxyCODONE  80 mg Oral Q12H   Assessment: 31yoM with [place] anemia, admit in [place] crisis. Hx of PE on chronic Warfarin; home dose 7.5mg  MWF, 5mg  other days. Last Warfarin dose unknown.  Admit INR 1.82, Lovenox and Warfarin per pharmacy dosing.  INR = 2.23 after 7.5mg  Coumadin  Goal of Therapy:  INR 2-3 Anti-Xa level 0.6-1.2 units/ml 4hrs after LMWH dose given   Plan:  Can discontinue Lovenox with  therapeutic INR Lovenox 80mg  q12 (until INR > or = 2.0) Warfarin 5mg  today Daily PT/INR  Otho Bellows PharmD Pager [phone] 03/18/2012,11:33 AM

## 2012-03-18 NOTE — Progress Notes (Signed)
03-18-12  NSG:  Pt would like to have the amount of his pain medication increased.  He has received 2 mg IV Dilaudid q2 - 2/12 throughout the shift.  Noted to be slurring his words occasionally.  And sleeping peacefully and when we wake him up for something he then reports a pain of 7/10.  VSS.  Refusing k-pad.

## 2012-03-18 NOTE — Progress Notes (Signed)
Dr. Earlene Plater notified pt requesting increase in pain medication.  MD to address.  RN continuing to monitor.

## 2012-03-18 NOTE — Progress Notes (Signed)
[facility] PROGRESS NOTE  [name] JWJ:[id number] DOB: 1980/10/22 DOA: 03/17/2012 PCP: [name] [name], MD  Assessment/Plan:  Sickle cell with pain crisis:  Dilaudid 2-4 mg IV every 2 hours when necessary. Decrease OxyContin to 40 mg twice a day and hold the by mouth Dilaudid for now. Continue scheduled ibuprofen.  - PPI for GI prophylaxis  - NS at 93ml/h  - Continue folate and hydroxyurea   Anemia  Will hold off on transfusion for now as hgb near baseline at 7.2.   Iatrogenic hemochromatosis: Ferritin ~3000   desferal infusions while inpatient, may need to continue Exjade at home.  Hypertension Stable, Continue lisinopril   Hypokalemia  Repleted potassium   Mood d/o stable. Continue zyprexa   DVT/PE.  PE diagnosed less than a month ago and INR is subtherapeutic   Bridge with Lovenox until therapeutic INR   Leukocytosis:  Near baseline of 14-17. CXR neg, UA neg   Thrombocytosis,  baseline 400-550, currently at baseline.      Code Status: Full Code Family Communication: None Disposition Plan: Home in 2-3 days Molli Posey, MD  Pager [phone].  If 7PM-7AM, please contact night-coverage at [email or web address], password [facility] 03/18/2012, 3:31 PM   LOS: 1 day    Consultants:  None  Procedures:  None  Antibiotics:  None ?  HPI/Subjective: Patient feels his pain is much improved.  Objective: Filed Vitals:   03/17/12 2225 03/18/12 0230 03/18/12 0550 03/18/12 1336  BP: 112/74 110/70 115/75 124/85  Pulse: 77 80 93 71  Temp: 98.6 F (37 C) 98.4 F (36.9 C) 98.2 F (36.8 C) 98.4 F (36.9 C)  TempSrc: Oral Oral Oral Oral  Resp: 18 20 20 16   Height:      Weight:      SpO2: 99% 99% 100% 100%    Intake/Output Summary (Last 24 hours) at 03/18/12 1531 Last data filed at 03/18/12 1338  Gross per 24 hour  Intake   3420 ml  Output   1026 ml  Net   2394 ml    Exam:   General:  Patient sitting up in bed. No acute distress.  Cardiovascular:  Regular rate rhythm  Respiratory: Clear to auscultation bilaterally  Abdomen: Positive bowel sounds nontender nondistended  Extremities: No edema  Data Reviewed: Basic Metabolic Panel:  Recent Labs Lab 03/17/12 1033 03/18/12 0500  NA 136 141  K 3.0* 3.7  CL 104 111  CO2 23 22  GLUCOSE 108* 103*  BUN 5* 6  CREATININE 0.58 0.67  CALCIUM 8.2* 8.2*   Liver Function Tests:  Recent Labs Lab 03/17/12 1100 03/18/12 0500  AST 37 45*  ALT 16 24  ALKPHOS 80 87  BILITOT 4.2* 2.8*  PROT 6.9 6.8  ALBUMIN 3.7 3.6    Recent Labs Lab 03/17/12 1033 03/18/12 0500  WBC 18.0* 19.0*  NEUTROABS 10.6*  --   HGB 6.9* 7.2*  HCT 20.2* 21.4*  MCV 93.5 95.1  PLT 491* 426*    Studies: Dg Chest 2 View  03/17/2012  *RADIOLOGY REPORT*  Clinical Data: 32 year old male with chest pain - history of sickle cell disease.  CHEST - 2 VIEW  Comparison: 03/01/2012  Findings: Cardiomegaly and left-sided Port-A-Cath again noted. Mild interstitial prominence is again noted. There is no evidence of focal airspace disease, pulmonary edema, suspicious pulmonary nodule/mass, pleural effusion, or pneumothorax. No acute bony abnormalities are identified.  IMPRESSION: Cardiomegaly without evidence of acute cardiopulmonary disease   Original Report Authenticated By: Harmon Pier, M.D.  Dg Chest 2 View  03/01/2012  *RADIOLOGY REPORT*  Clinical Data: Sickle cell crisis  CHEST - 2 VIEW  Comparison: January 14, 2012  Findings:  Port-A-Cath tip is at the superior vena cava - right atrium junction.  No pneumothorax.  There is stable mild scarring in the lung bases.  Lungs are otherwise clear.  Heart is mildly enlarged with the a mild degree of pulmonary venous hypertension which is stable.  No adenopathy. No bone lesions are appreciated.  IMPRESSION: There is evidence of mild cardiac enlargement with mild pulmonary venous hypertension.  A degree of volume overload/high output state is suspected, consistent with known  sickle cell disease.  There is no frank edema or consolidation.  Mild lung scarring is stable.   Original Report Authenticated By: Bretta Bang, M.D.    Ct Angio Chest Pe W/cm &/or Wo Cm  03/01/2012  *RADIOLOGY REPORT*  Clinical Data: Shortness of breath.  Evaluate for pulmonary embolism.  History of sickle cell disease.  CT ANGIOGRAPHY CHEST  Technique:  Multidetector CT imaging of the chest using the standard protocol during bolus administration of intravenous contrast. Multiplanar reconstructed images including MIPs were obtained and reviewed to evaluate the vascular anatomy.  Contrast: OMNIPAQUE IOHEXOL 350 MG/ML SOLN  Comparison: Chest CT 11/21/2011.  Findings:  Mediastinum: There are multiple small segmental and subsegmental sized filling defects within the pulmonary arteries to the lower lobes of the lungs bilaterally.  No larger central or lobar sized filling defects are noted at this time.  These emboli appear to be nonocclusive at this time.  The heart is moderately enlarged without definite signs of right-sided heart strain at this time. Left subclavian single lumen power Port-A-Cath with tip terminating in the right atrium. There is no significant pericardial fluid, thickening or pericardial calcification.  Mildly enlarged right hilar lymph node (1.5 cm in diameter), similar to prior.  Numerous reactive size mediastinal and left hilar lymph nodes are noted. Esophagus is unremarkable in appearance.  Lungs/Pleura: Extensive thickening of the peribronchovascular interstitium and septal thickening throughout the lower lungs bilaterally, with multiple areas of mild architectural distortion. The overall appearance is very similar to the prior examination, most compatible with chronic areas of scarring.  No definite suspicious appearing pulmonary nodules or masses.  No definite acute consolidative air space disease.  No pleural effusions.  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  Cortical thinning in the posterior aspect of the upper pole of the left kidney, suggesting scarring.  Musculoskeletal: Multifocal sclerosis in the bones related to areas of avascular necrosis.  H shaped vertebral bodies compatible with the diagnosis of sickle cell disease. There are no aggressive appearing lytic or blastic lesions noted in the visualized portions of the skeleton.  IMPRESSION: 1.  Study is positive for segmental and subsegmental sized emboli to the lower lobes of the lungs bilaterally.  2.  Extensive chronic scarring throughout the lower lungs, similar to prior studies. 3.  Cardiomegaly. 4.  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 5.  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  Critical Value/emergent results were called by telephone at the time of interpretation on 03/01/2012  at 12:20 p.m. to Dr. Jeraldine Loots, who verbally acknowledged these results.   Original Report Authenticated By: Trudie Reed, M.D.     Scheduled Meds: . docusate sodium  100 mg Oral BID  . enoxaparin (LOVENOX) injection  80 mg Subcutaneous BID  . folic acid  1 mg Oral Daily  . hydroxyurea  500 mg Oral BID  . ibuprofen  800 mg Oral TID  . lisinopril  10 mg Oral Q2200  . OLANZapine  10 mg Oral QHS  . OxyCODONE  40 mg Oral Q12H  . pantoprazole  40 mg Oral Daily   Or  . pantoprazole (PROTONIX) IV  40 mg Intravenous Daily  . potassium chloride SA  20 mEq Oral BID  . senna  1 tablet Oral BID  . warfarin  5 mg Oral ONCE-1800  . Warfarin - Pharmacist Dosing Inpatient   Does not apply q1800   Continuous Infusions: . sodium chloride 75 mL/hr at 03/18/12 1503     Time Spent :25 min

## 2012-03-19 DIAGNOSIS — D5700 Hb-SS disease with crisis, unspecified: Secondary | ICD-10-CM | POA: Diagnosis not present

## 2012-03-19 DIAGNOSIS — I10 Essential (primary) hypertension: Secondary | ICD-10-CM | POA: Diagnosis not present

## 2012-03-19 DIAGNOSIS — D72829 Elevated white blood cell count, unspecified: Secondary | ICD-10-CM | POA: Diagnosis not present

## 2012-03-19 DIAGNOSIS — E876 Hypokalemia: Secondary | ICD-10-CM | POA: Diagnosis not present

## 2012-03-19 DIAGNOSIS — I82C19 Acute embolism and thrombosis of unspecified internal jugular vein: Secondary | ICD-10-CM | POA: Diagnosis not present

## 2012-03-19 DIAGNOSIS — M549 Dorsalgia, unspecified: Secondary | ICD-10-CM | POA: Diagnosis not present

## 2012-03-19 DIAGNOSIS — I2699 Other pulmonary embolism without acute cor pulmonale: Secondary | ICD-10-CM | POA: Diagnosis not present

## 2012-03-19 LAB — CBC WITH DIFFERENTIAL/PLATELET
Basophils Relative: 0 % (ref 0–1)
Eosinophils Absolute: 1.9 10*3/uL — ABNORMAL HIGH (ref 0.0–0.7)
Eosinophils Relative: 11 % — ABNORMAL HIGH (ref 0–5)
HCT: 22.2 % — ABNORMAL LOW (ref 39.0–52.0)
Hemoglobin: 7.3 g/dL — ABNORMAL LOW (ref 13.0–17.0)
Lymphocytes Relative: 21 % (ref 12–46)
MCH: 31.5 pg (ref 26.0–34.0)
MCHC: 32.9 g/dL (ref 30.0–36.0)
Monocytes Absolute: 2.1 10*3/uL — ABNORMAL HIGH (ref 0.1–1.0)
Neutro Abs: 9.7 10*3/uL — ABNORMAL HIGH (ref 1.7–7.7)

## 2012-03-19 LAB — COMPREHENSIVE METABOLIC PANEL
Alkaline Phosphatase: 84 U/L (ref 39–117)
BUN: 6 mg/dL (ref 6–23)
CO2: 20 mEq/L (ref 19–32)
GFR calc Af Amer: >90 mL/min (ref >90)
GFR calc non Af Amer: >90 mL/min (ref >90)
Glucose, Bld: 94 mg/dL (ref 70–99)
Potassium: 4 mEq/L (ref 3.5–5.1)
Total Protein: 6.7 g/dL (ref 6.0–8.3)

## 2012-03-19 MED ORDER — NALOXONE HCL 0.4 MG/ML IJ SOLN: 0.4000 mg | INTRAMUSCULAR | Status: DC | PRN

## 2012-03-19 MED ORDER — HYDROMORPHONE HCL PF 2 MG/ML IJ SOLN
4.0000 mg | INTRAMUSCULAR | Status: DC
  Administered 2012-03-19 – 2012-03-20 (×12): 4 mg via INTRAVENOUS
  Filled 2012-03-19: qty 2
  Filled 2012-03-19: qty 1
  Filled 2012-03-19 (×6): qty 2
  Filled 2012-03-19: qty 1
  Filled 2012-03-19 (×3): qty 2

## 2012-03-19 MED ORDER — SODIUM CHLORIDE 0.9 % IJ SOLN: 9.0000 mL | INTRAMUSCULAR | Status: DC | PRN

## 2012-03-19 MED ORDER — KETOROLAC TROMETHAMINE 30 MG/ML IJ SOLN
30.0000 mg | Freq: Four times a day (QID) | INTRAMUSCULAR | Status: DC
  Administered 2012-03-19 – 2012-03-20 (×4): 30 mg via INTRAVENOUS
  Filled 2012-03-19 (×8): qty 1

## 2012-03-19 MED ORDER — WARFARIN SODIUM 7.5 MG PO TABS
7.5000 mg | ORAL_TABLET | Freq: Once | ORAL | Status: AC
  Administered 2012-03-19: 7.5 mg via ORAL
  Filled 2012-03-19: qty 1

## 2012-03-19 MED ORDER — HYDROMORPHONE 0.3 MG/ML IV SOLN
INTRAVENOUS | Status: DC
  Administered 2012-03-19 (×2): 0.3 mg via INTRAVENOUS
  Administered 2012-03-19: 21:00:00 via INTRAVENOUS
  Administered 2012-03-20: 2.7 mL via INTRAVENOUS
  Administered 2012-03-20: 3.48 mg via INTRAVENOUS
  Filled 2012-03-19 (×2): qty 25

## 2012-03-19 NOTE — Progress Notes (Signed)
SICKLE CELL SERVICE PROGRESS NOTE  [name] [name] DOB: 1980/06/26 DOA: 03/17/2012 PCP: Gerald Saucer [name], MD  Assessment/Plan:    Sickle cell anemia with crisis: Patient states that pain approximately a 7/10. However relief lasting less than 2 hours. I will continue the patient on scheduled boluses and at that standard PCA to be used on a when necessary basis. I discussed with the patient that the goal to transition him over to oral medications with decreasing IV dosing for breakthrough within the next 24-48 hours. Hopefully transition to home within the next 48-72 hours   Pulmonary embolism: Patient was recently diagnosed with a pulmonary embolism less than a month ago. He was subtherapeutic on Coumadin on admission. Currently he is within the therapeutic range and Lovenox will be discontinued.  Secondary hemochromatosis: Patient is noted to have elevated ferritin on his last laboratory studies done as an outpatient. He's not yet been initiated on deflation therapy. I will start him on Desyrel during his hospitalization his physical social worker about assisting the patient obtain an x-ray as an outpatient.    Leukocytosis: Felt to be secondary to inflammatory state associated with acute vaso-occlusive episode. The patient was started on IV ketorolac and ibuprofen discontinued for now. I will continue to monitor his white blood cell count.    Hypokalemia: replaced and are within normal limits the    Mood disorder: Continue Zyprexa.     Hypertension: Blood pressure well-controlled. Continue current medication    Code Status: Full code Family Communication: Not applicable Disposition Plan: Home in 48-72 hour  [name] A.  Pager [phone]. If 7PM-7AM, please contact night-coverage.  03/19/2012, 6:38 PM  LOS: 2 days   Brief narrative: The patient is a 32 y.o. 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 He states he has been feeling about the same since he was discharged from the hospital about 2 weeks ago. He has had a visit to the sickle cell clinic 2/5 and a visit to the ER 2/8 in the meantime. He normally takes oxycontin 80mg  BID and dilaudid 4mg  about twice a day, however, he ran out of his dilaudid 5 days ago on 2/11. He has had 8/10 soreness of the ribs on both sides and 8/10 soreness in the bilateral thighs. He has been taking his other pain medication at home without relief. Nothing makes his pain worse. His pain is constant. He states he has been feeling well otherwise.  In the ER, he was given dilaudid 2mg  IV once without relief. His labs were near his baseline. He was hypokalemic to 3.0 and his INR was subtherapeutic at 1.82. He is being admitted for pain control.    Consultants:  None  Procedures:  None  Antibiotics:  None  HPI/Subjective: Patient states that pain approximately a 7/10. However relief lasting less than 2 hours. The pain is localized to back and legs. He describes the pain as dull and occasionally throbbing in nature. The patient denies any other associated symptoms.  Objective: Filed Vitals:   03/19/12 1426 03/19/12 1524 03/19/12 1540 03/19/12 1607  BP: 127/86  125/82 123/85  Pulse: 80   70  Temp: 98.4 F (36.9 C)     TempSrc: Oral     Resp: 19 15 16 16   Height:      Weight:  SpO2: 97% 98% 98% 99%   Weight change:   Intake/Output Summary (Last 24 hours) at 03/19/12 1838 Last data filed at 03/19/12 1427  Gross per 24 hour  Intake   2720 ml  Output   2450 ml  Net    270 ml    General: Alert, awake, oriented x3, in no acute distress.  HEENT: [place]/AT PEERL, EOMI, Anicteric OROPHARYNX:  Moist, No exudate/ erythema/lesions.  Heart: Regular rate and rhythm, without murmurs, rubs, gallops.  Lungs: Clear to auscultation, no wheezing or  rhonchi noted.  Abdomen: Soft, nontender, nondistended, positive bowel sounds, no masses no hepatosplenomegaly noted.  Neuro: No focal neurological deficits noted cranial nerves II through XII grossly intact.Strength normal in bilateral upper and lower extremities. Musculoskeletal: No warm swelling or erythema around joints, no spinal tenderness noted.   Data Reviewed: Basic Metabolic Panel:  Recent Labs Lab 03/17/12 1033 03/18/12 0500 03/19/12 0820  NA 136 141 137  K 3.0* 3.7 4.0  CL 104 111 109  CO2 23 22 20   GLUCOSE 108* 103* 94  BUN 5* 6 6  CREATININE 0.58 0.67 0.61  CALCIUM 8.2* 8.2* 8.0*   Liver Function Tests:  Recent Labs Lab 03/17/12 1100 03/18/12 0500 03/19/12 0820  AST 37 45* 36  ALT 16 24 24   ALKPHOS 80 87 84  BILITOT 4.2* 2.8* 1.7*  PROT 6.9 6.8 6.7  ALBUMIN 3.7 3.6 3.3*   No results found for this basename: LIPASE, AMYLASE,  in the last 168 hours No results found for this basename: AMMONIA,  in the last 168 hours CBC:  Recent Labs Lab 03/17/12 1033 03/18/12 0500 03/19/12 0820  WBC 18.0* 19.0* 17.3*  NEUTROABS 10.6*  --  9.7*  HGB 6.9* 7.2* 7.3*  HCT 20.2* 21.4* 22.2*  MCV 93.5 95.1 95.7  PLT 491* 426* 527*   Cardiac Enzymes: No results found for this basename: CKTOTAL, CKMB, CKMBINDEX, TROPONINI,  in the last 168 hours BNP (last 3 results)  Recent Labs  06/07/11 0650 07/05/11 0620 07/18/11 1500  PROBNP 7.4 219.1* 13.5   CBG: No results found for this basename: GLUCAP,  in the last 168 hours  No results found for this or any previous visit (from the past 240 hour(s)).   Studies: Dg Chest 2 View  03/17/2012  *RADIOLOGY REPORT*  Clinical Data: 32 year old male with chest pain - history of sickle cell disease.  CHEST - 2 VIEW  Comparison: 03/01/2012  Findings: Cardiomegaly and left-sided Port-A-Cath again noted. Mild interstitial prominence is again noted. There is no evidence of focal airspace disease, pulmonary edema, suspicious  pulmonary nodule/mass, pleural effusion, or pneumothorax. No acute bony abnormalities are identified.  IMPRESSION: Cardiomegaly without evidence of acute cardiopulmonary disease   Original Report Authenticated By: Harmon Pier, M.D.    Dg Chest 2 View  03/01/2012  *RADIOLOGY REPORT*  Clinical Data: Sickle cell crisis  CHEST - 2 VIEW  Comparison: January 14, 2012  Findings:  Port-A-Cath tip is at the superior vena cava - right atrium junction.  No pneumothorax.  There is stable mild scarring in the lung bases.  Lungs are otherwise clear.  Heart is mildly enlarged with the a mild degree of pulmonary venous hypertension which is stable.  No adenopathy. No bone lesions are appreciated.  IMPRESSION: There is evidence of mild cardiac enlargement with mild pulmonary venous hypertension.  A degree of volume overload/high output state is suspected, consistent with known sickle cell disease.  There is no frank edema or consolidation.  Mild  lung scarring is stable.   Original Report Authenticated By: Bretta Bang, M.D.    Ct Angio Chest Pe W/cm &/or Wo Cm  03/01/2012  *RADIOLOGY REPORT*  Clinical Data: Shortness of breath.  Evaluate for pulmonary embolism.  History of sickle cell disease.  CT ANGIOGRAPHY CHEST  Technique:  Multidetector CT imaging of the chest using the standard protocol during bolus administration of intravenous contrast. Multiplanar reconstructed images including MIPs were obtained and reviewed to evaluate the vascular anatomy.  Contrast: OMNIPAQUE IOHEXOL 350 MG/ML SOLN  Comparison: Chest CT 11/21/2011.  Findings:  Mediastinum: There are multiple small segmental and subsegmental sized filling defects within the pulmonary arteries to the lower lobes of the lungs bilaterally.  No larger central or lobar sized filling defects are noted at this time.  These emboli appear to be nonocclusive at this time.  The heart is moderately enlarged without definite signs of right-sided heart strain at this  time. Left subclavian single lumen power Port-A-Cath with tip terminating in the right atrium. There is no significant pericardial fluid, thickening or pericardial calcification.  Mildly enlarged right hilar lymph node (1.5 cm in diameter), similar to prior.  Numerous reactive size mediastinal and left hilar lymph nodes are noted. Esophagus is unremarkable in appearance.  Lungs/Pleura: Extensive thickening of the peribronchovascular interstitium and septal thickening throughout the lower lungs bilaterally, with multiple areas of mild architectural distortion. The overall appearance is very similar to the prior examination, most compatible with chronic areas of scarring.  No definite suspicious appearing pulmonary nodules or masses.  No definite acute consolidative air space disease.  No pleural effusions.  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  Cortical thinning in the posterior aspect of the upper pole of the left kidney, suggesting scarring.  Musculoskeletal: Multifocal sclerosis in the bones related to areas of avascular necrosis.  H shaped vertebral bodies compatible with the diagnosis of sickle cell disease. There are no aggressive appearing lytic or blastic lesions noted in the visualized portions of the skeleton.  IMPRESSION: 1.  Study is positive for segmental and subsegmental sized emboli to the lower lobes of the lungs bilaterally.  2.  Extensive chronic scarring throughout the lower lungs, similar to prior studies. 3.  Cardiomegaly. 4.  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 5.  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  Critical Value/emergent results were called by telephone at the time of interpretation on 03/01/2012  at 12:20 p.m. to Dr. Jeraldine Loots, who verbally acknowledged these results.    Original Report Authenticated By: Trudie Reed, M.D.     Scheduled Meds: . deferoxamine (DESFERAL) IV  2,000 mg Intravenous QHS  . docusate sodium  100 mg Oral BID  . folic acid  1 mg Oral Daily  .  HYDROmorphone (DILAUDID) injection  4 mg Intravenous Q2H  . HYDROmorphone PCA 0.3 mg/mL   Intravenous Q4H  . hydroxyurea  500 mg Oral BID  . ketorolac  30 mg Intravenous Q6H  . lisinopril  10 mg Oral Q2200  . OLANZapine  10 mg Oral QHS  . OxyCODONE  40 mg Oral Q12H  . pantoprazole  40 mg Oral Daily   Or  . pantoprazole (PROTONIX) IV  40 mg Intravenous Daily  . potassium chloride SA  20 mEq Oral BID  . senna  1 tablet Oral BID  .  Warfarin - Pharmacist Dosing Inpatient   Does not apply q1800   Continuous Infusions: . sodium chloride 75 mL/hr at 03/18/12 1503

## 2012-03-19 NOTE — Progress Notes (Signed)
ANTICOAGULATION CONSULT NOTE -Follow up  Pharmacy Consult for Lovenox/Warfarin Indication: Hx PE, chronic Warfarin  Allergies  Allergen Reactions  . Morphine And Related Hives and Rash    Pt states he is able to tolerate Dilaudid with no reactions.   Patient Measurements: Height: 6' (182.9 cm) Weight: 171 lb 4.8 oz (77.7 kg) IBW/kg (Calculated) : 77.6  Vital Signs: Temp: 98.4 F (36.9 C) (02/17 0530) Temp src: Oral (02/17 0530) BP: 137/91 mmHg (02/17 0530) Pulse Rate: 61 (02/17 0530)  Labs:  Recent Labs  03/17/12 1033 03/18/12 0500 03/19/12 0820  HGB 6.9* 7.2* 7.3*  HCT 20.2* 21.4* 22.2*  PLT 491* 426* 527*  LABPROT 20.4* 23.7* 24.9*  INR 1.82* 2.23* 2.38*  CREATININE 0.58 0.67 0.61   Estimated Creatinine Clearance: 145.5 ml/min (by C-G formula based on Cr of 0.61).  Medications:  Scheduled:  . deferoxamine (DESFERAL) IV  2,000 mg Intravenous QHS  . docusate sodium  100 mg Oral BID  . enoxaparin (LOVENOX) injection  80 mg Subcutaneous BID  . folic acid  1 mg Oral Daily  . hydroxyurea  500 mg Oral BID  . ibuprofen  800 mg Oral TID  . lisinopril  10 mg Oral Q2200  . OLANZapine  10 mg Oral QHS  . OxyCODONE  40 mg Oral Q12H  . pantoprazole  40 mg Oral Daily   Or  . pantoprazole (PROTONIX) IV  40 mg Intravenous Daily  . potassium chloride SA  20 mEq Oral BID  . senna  1 tablet Oral BID  . [COMPLETED] warfarin  5 mg Oral ONCE-1800  . Warfarin - Pharmacist Dosing Inpatient   Does not apply q1800    Assessment: 31yoM with [place] anemia, admit in [name] crisis. Hx of PE on chronic Warfarin; home dose 7.5mg  MWF, 5mg  other days.   Admit INR 1.82, Lovenox and Warfarin per pharmacy dosing.  INR therapeutic (2.38)  H/H low but stable, Pltc ok, no bleeding/complications reported.  Concurrent ibuprofen noted - monitor for increased bleeding  Goal of Therapy:  INR 2-3 Anti-Xa level 0.6-1.2 units/ml 4hrs after LMWH dose given   Plan:   Discontinue Lovenox with  therapeutic INR  Warfarin 7.5mg  today  Daily PT/INR  Loralee Pacas, PharmD, BCPS Pager: [phone] 03/19/2012,9:55 AM

## 2012-03-20 ENCOUNTER — Non-Acute Institutional Stay (HOSPITAL_COMMUNITY)
Admission: AD | Admit: 2012-03-20 | Discharge: 2012-03-21 | Disposition: A | Discharge status: Home / Self Care | Payer: Medicare Other | Attending: Internal Medicine | Admitting: Internal Medicine

## 2012-03-20 ENCOUNTER — Encounter (HOSPITAL_COMMUNITY): Payer: Self-pay | Admitting: *Deleted

## 2012-03-20 DIAGNOSIS — M549 Dorsalgia, unspecified: Secondary | ICD-10-CM | POA: Diagnosis not present

## 2012-03-20 DIAGNOSIS — I10 Essential (primary) hypertension: Secondary | ICD-10-CM | POA: Diagnosis not present

## 2012-03-20 DIAGNOSIS — I2699 Other pulmonary embolism without acute cor pulmonale: Secondary | ICD-10-CM | POA: Insufficient documentation

## 2012-03-20 DIAGNOSIS — F39 Unspecified mood [affective] disorder: Secondary | ICD-10-CM | POA: Insufficient documentation

## 2012-03-20 DIAGNOSIS — D5700 Hb-SS disease with crisis, unspecified: Secondary | ICD-10-CM | POA: Insufficient documentation

## 2012-03-20 DIAGNOSIS — I82C19 Acute embolism and thrombosis of unspecified internal jugular vein: Secondary | ICD-10-CM | POA: Diagnosis not present

## 2012-03-20 DIAGNOSIS — E876 Hypokalemia: Secondary | ICD-10-CM | POA: Diagnosis not present

## 2012-03-20 DIAGNOSIS — D638 Anemia in other chronic diseases classified elsewhere: Secondary | ICD-10-CM | POA: Insufficient documentation

## 2012-03-20 DIAGNOSIS — D72829 Elevated white blood cell count, unspecified: Secondary | ICD-10-CM | POA: Diagnosis not present

## 2012-03-20 LAB — CBC
HCT: 21.3 % — ABNORMAL LOW (ref 39.0–52.0)
Hemoglobin: 7.4 g/dL — ABNORMAL LOW (ref 13.0–17.0)
MCV: 95.1 fL (ref 78.0–100.0)
RBC: 2.24 MIL/uL — ABNORMAL LOW (ref 4.22–5.81)
RDW: 21.5 % — ABNORMAL HIGH (ref 11.5–15.5)
WBC: 17.5 10*3/uL — ABNORMAL HIGH (ref 4.0–10.5)

## 2012-03-20 MED ORDER — DEXTROSE-NACL 5-0.45 % IV SOLN
INTRAVENOUS | Status: DC
  Administered 2012-03-20: 14:00:00 via INTRAVENOUS

## 2012-03-20 MED ORDER — WARFARIN SODIUM 5 MG PO TABS
5.0000 mg | ORAL_TABLET | ORAL | Status: DC
  Administered 2012-03-20: 5 mg via ORAL
  Filled 2012-03-20: qty 1

## 2012-03-20 MED ORDER — POTASSIUM CHLORIDE CRYS ER 20 MEQ PO TBCR
20.0000 meq | EXTENDED_RELEASE_TABLET | Freq: Two times a day (BID) | ORAL | Status: DC
  Administered 2012-03-20 – 2012-03-21 (×2): 20 meq via ORAL
  Filled 2012-03-20 (×2): qty 1

## 2012-03-20 MED ORDER — HYDROMORPHONE HCL PF 2 MG/ML IJ SOLN
2.0000 mg | INTRAMUSCULAR | Status: DC | PRN
  Administered 2012-03-20 – 2012-03-21 (×5): 4 mg via INTRAVENOUS
  Filled 2012-03-20 (×2): qty 2
  Filled 2012-03-20: qty 1
  Filled 2012-03-20 (×3): qty 2

## 2012-03-20 MED ORDER — KETOROLAC TROMETHAMINE 30 MG/ML IJ SOLN
30.0000 mg | Freq: Four times a day (QID) | INTRAMUSCULAR | Status: AC
  Administered 2012-03-20 – 2012-03-21 (×4): 30 mg via INTRAVENOUS
  Filled 2012-03-20 (×4): qty 1

## 2012-03-20 MED ORDER — HEPARIN SOD (PORK) LOCK FLUSH 100 UNIT/ML IV SOLN
500.0000 [IU] | INTRAVENOUS | Status: AC | PRN
  Administered 2012-03-20: 500 [IU]

## 2012-03-20 MED ORDER — WARFARIN SODIUM 5 MG PO TABS: 5.0000 mg | ORAL_TABLET | Freq: Every day | ORAL | Status: DC

## 2012-03-20 MED ORDER — FOLIC ACID 1 MG PO TABS
1.0000 mg | ORAL_TABLET | Freq: Every day | ORAL | Status: DC
  Administered 2012-03-21: 1 mg via ORAL
  Filled 2012-03-20: qty 1

## 2012-03-20 MED ORDER — DEXTROSE 5 % IV SOLN
2000.0000 mg | Freq: Every day | INTRAVENOUS | Status: DC
  Administered 2012-03-20: 2000 mg via INTRAVENOUS
  Filled 2012-03-20: qty 2

## 2012-03-20 MED ORDER — HYDROMORPHONE HCL 4 MG PO TABS
8.0000 mg | ORAL_TABLET | ORAL | Status: DC
  Administered 2012-03-20 – 2012-03-21 (×5): 8 mg via ORAL
  Filled 2012-03-20 (×5): qty 2

## 2012-03-20 MED ORDER — WARFARIN SODIUM 7.5 MG PO TABS
7.5000 mg | ORAL_TABLET | ORAL | Status: DC
  Filled 2012-03-20: qty 1

## 2012-03-20 MED ORDER — LISINOPRIL 10 MG PO TABS
10.0000 mg | ORAL_TABLET | Freq: Every day | ORAL | Status: DC
  Administered 2012-03-20: 10 mg via ORAL
  Filled 2012-03-20 (×2): qty 1

## 2012-03-20 MED ORDER — SENNA 8.6 MG PO TABS: 1.0000 | ORAL_TABLET | Freq: Two times a day (BID) | ORAL | Status: DC

## 2012-03-20 MED ORDER — OXYCODONE HCL 80 MG PO TB12: 80.0000 mg | ORAL_TABLET | Freq: Two times a day (BID) | ORAL | Status: DC

## 2012-03-20 MED ORDER — ZOLPIDEM TARTRATE 5 MG PO TABS: 10.0000 mg | ORAL_TABLET | Freq: Every evening | ORAL | Status: DC | PRN

## 2012-03-20 MED ORDER — OXYCODONE HCL ER 80 MG PO T12A
80.0000 mg | EXTENDED_RELEASE_TABLET | Freq: Two times a day (BID) | ORAL | Status: DC
  Administered 2012-03-20 – 2012-03-21 (×2): 80 mg via ORAL
  Filled 2012-03-20 (×2): qty 1

## 2012-03-20 MED ORDER — OLANZAPINE 10 MG PO TABS
10.0000 mg | ORAL_TABLET | Freq: Every day | ORAL | Status: DC
  Administered 2012-03-20: 10 mg via ORAL
  Filled 2012-03-20 (×2): qty 1

## 2012-03-20 MED ORDER — HYDROXYUREA 500 MG PO CAPS
500.0000 mg | ORAL_CAPSULE | Freq: Two times a day (BID) | ORAL | Status: DC
  Administered 2012-03-20 – 2012-03-21 (×2): 500 mg via ORAL
  Filled 2012-03-20 (×2): qty 1

## 2012-03-20 MED ORDER — DOCUSATE SODIUM 100 MG PO CAPS
100.0000 mg | ORAL_CAPSULE | Freq: Two times a day (BID) | ORAL | Status: DC
  Administered 2012-03-20 – 2012-03-21 (×2): 100 mg via ORAL
  Filled 2012-03-20 (×2): qty 1

## 2012-03-20 NOTE — Discharge Summary (Signed)
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     HYDROmorphone 4 MG tablet  Commonly known as:  DILAUDID  Take 1-2 tablets (4-8 mg total) by mouth every 4 (four) hours as needed for pain. For pain.     hydroxyurea 500 MG capsule  Commonly known as:  HYDREA  Take 1 capsule (500 mg total) by mouth 2 (two) times daily. May take with food to minimize GI side effects.     ibuprofen 800 MG tablet  Commonly known as:  ADVIL,MOTRIN  Take 1 tablet (800 mg total) by mouth every 8 (eight) hours as needed for pain. Do not exceed use for more than 20/30 days     lisinopril 10 MG tablet  Commonly known as:  PRINIVIL,ZESTRIL  Take 1 tablet (10 mg total) by mouth daily at 10 pm.     OLANZapine 10 MG tablet  Commonly known as:  ZYPREXA  Take 10 mg by mouth at bedtime.     oxyCODONE 80 MG 12 hr tablet  Commonly known as:  OXYCONTIN  Take 1 tablet (80 mg total) by mouth every 12 (twelve) hours.     potassium chloride SA 20 MEQ tablet  Commonly known as:  K-DUR,KLOR-CON  Take 1 tablet (20 mEq total) by mouth 2 (two) times daily.     warfarin 5 MG tablet  Commonly known as:  COUMADIN  Take 5-7.5 mg by mouth daily. Takes 5 mg daily except Monday Wednesday and Friday he takes 7.5 mg     zolpidem 10 MG tablet  Commonly known as:  AMBIEN  Take 1  tablet (10 mg total) by mouth at bedtime as needed. For sleep        Hospital Course: For complete details please refer ti admission H and P but in brief:  This is a 32 y.o. 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 He presented to the ED with escalating pain after he ran out of his pain medication. He was dx and admitted for  uncomplicated VOC with decrease functional ability.   Sickle cell crisis with pain He was Inially treated with bolus doses of analgesic medication which was ineffective than a PCA was added which brought his pain under control and increased his functional capacity. Patient was d/c to complete manage of pain in ambulatory setting.   was able     Recently dx on last admission with a new  PE/DVT on anticoagulant medication monitored, hypertension , and mood disorder. His initial complaint when presented to the ED was bilateral ribs and thigh pain rating  8/10.  He has been taking his pain medication at home without relief. Nothing makes his pain worse. His pain is constant. Initially hypokalemia and sub therapeutic INR both repleted.  Pulmonary embolism:  recently dx on chronic anticoagulation therapy  admitted subtheraputic INR 1.98  titrated  to therapeutic d/c at 2.46 f/u for INR is 03/28/12 with PCP  Secondary hemochromatosis: most recent Ferritin 3015 on 03/07/12; IV Desferal qhs while inpatient. LCSW to assist with Exjade after discharge.   Leukocytosis:  Admission 19.0 and d/c 17.3 probable  bone marrow reactive process with VOC .  Mood disorder: normal affect mood stable . Continue Zyprexa.  Hypertension: Blood pressure well-controlled. Continue current medication.  Hypokalemia: admitted with 3.0  K+ repleted d/c 4.0.  Day of Discharge BP 133/88  Pulse 63  Temp(Src) 98.7 F (37.1 C) (Oral)  Resp 18  Ht 6' (1.829 m)  Wt 77.7 kg (171 lb 4.8 oz)  BMI 23.23 kg/m2  SpO2 100% General  Appearance: Alert, Ox3, pleasant and cooperative  Head: Normocephalic, atraumatic  Eyes: PERRL, slight strabismus, mild-mod icteric sclera  Back: Symmetric, no CVA tenderness  Resp: even and nonlabored, diminished bilateral bases, no vocal fremitus; no wheezes/rales/rhonchi  Cardio: S1, S2 regular, no murmurs/clicks/rubs/gallops  GI: Soft, Non tender , Non distended , + bowel sounds, no masses or organomegaly appreciated  Male Genitalia: Deferred; denies priapism  Extremities: Extremities normal, atraumatic, no cyanosis or edema, negative Homans; Left chest Port-A-Cath  Pulses: 2+ and symmetric  Skin: Skin intact  Neurologic: Grossly intact, no focal deficits  Psych: Appropriate affect   Results for orders placed during the hospital encounter of 03/17/12 (from the past 48 hour(s))  PROTIME-INR     Status: Abnormal   Collection Time    03/19/12  8:20 AM      Result Value Range   Prothrombin Time 24.9 (*) 11.6 - 15.2 seconds   INR 2.38 (*) 0.00 - 1.49  CBC WITH DIFFERENTIAL     Status: Abnormal   Collection Time    03/19/12  8:20 AM      Result Value Range   WBC 17.3 (*) 4.0 - 10.5 K/uL   RBC 2.32 (*) 4.22 - 5.81 MIL/uL   Hemoglobin 7.3 (*) 13.0 - 17.0 g/dL   HCT 16.1 (*) 09.6 - 04.5 %   MCV 95.7  78.0 - 100.0 fL   MCH 31.5  26.0 - 34.0 pg   MCHC 32.9  30.0 - 36.0 g/dL   RDW 40.9 (*) 81.1 - 91.4 %   Platelets 527 (*) 150 - 400 K/uL   Neutrophils Relative 56  43 - 77 %   Lymphocytes Relative 21  12 - 46 %   Monocytes Relative 12  3 - 12 %   Eosinophils Relative 11 (*) 0 - 5 %   Basophils Relative 0  0 - 1 %   Neutro Abs 9.7 (*) 1.7 - 7.7 K/uL   Lymphs Abs 3.6  0.7 - 4.0 K/uL   Monocytes Absolute 2.1 (*) 0.1 - 1.0 K/uL   Eosinophils Absolute 1.9 (*) 0.0 - 0.7 K/uL   Basophils Absolute 0.0  0.0 - 0.1 K/uL   RBC Morphology POLYCHROMASIA PRESENT     Comment: TARGET CELLS     HOWELL/JOLLY BODIES     SICKLE CELLS     RARE NRBCs  COMPREHENSIVE METABOLIC PANEL     Status:  Abnormal   Collection Time    03/19/12  8:20 AM      Result Value Range   Sodium 137  135 - 145 mEq/L   Potassium 4.0  3.5 - 5.1 mEq/L   Chloride 109  96 - 112 mEq/L   CO2 20  19 - 32 mEq/L   Glucose, Bld 94  70 - 99 mg/dL   BUN 6  6 - 23 mg/dL   Creatinine, Ser 1.61  0.50 - 1.35 mg/dL   Calcium 8.0 (*) 8.4 - 10.5 mg/dL   Total Protein 6.7  6.0 - 8.3 g/dL   Albumin 3.3 (*) 3.5 - 5.2 g/dL   AST 36  0 - 37 U/L   ALT 24  0 - 53 U/L   Alkaline Phosphatase 84  39 - 117 U/L   Total Bilirubin 1.7 (*) 0.3 - 1.2 mg/dL   GFR calc non Af Amer >90  >90 mL/min   GFR calc Af Amer >90  >90 mL/min   Comment:            The eGFR has been calculated     using the CKD EPI equation.     This calculation has not been     validated in all clinical     situations.     eGFR's persistently     <90 mL/min signify     possible Chronic Kidney Disease.  CBC     Status: Abnormal   Collection Time    03/20/12  5:11 AM      Result Value Range   WBC 17.5 (*) 4.0 - 10.5 K/uL   RBC 2.24 (*) 4.22 - 5.81 MIL/uL   Hemoglobin 7.4 (*) 13.0 - 17.0 g/dL   HCT 09.6 (*) 04.5 - 40.9 %   MCV 95.1  78.0 - 100.0 fL   MCH 33.0  26.0 - 34.0 pg   MCHC 34.7  30.0 - 36.0 g/dL   RDW 81.1 (*) 91.4 - 78.2 %   Platelets 498 (*) 150 - 400 K/uL  PROTIME-INR     Status: Abnormal   Collection Time    03/20/12  5:11 AM      Result Value Range   Prothrombin Time 25.5 (*) 11.6 - 15.2 seconds   INR 2.46 (*) 0.00 - 1.49    Dg Chest 2 View  03/17/2012  *RADIOLOGY REPORT*  Clinical Data: 32 year old male with chest pain - history of sickle cell disease.  CHEST - 2 VIEW  Comparison: 03/01/2012  Findings: Cardiomegaly and left-sided Port-A-Cath again noted. Mild interstitial prominence is again noted. There is no evidence of focal airspace disease, pulmonary edema, suspicious pulmonary nodule/mass, pleural effusion, or pneumothorax. No acute bony abnormalities are identified.  IMPRESSION: Cardiomegaly without evidence of acute  cardiopulmonary disease   Original Report Authenticated By: Harmon Pier, M.D.    Dg Chest 2 View  03/01/2012  *RADIOLOGY REPORT*  Clinical Data: Sickle cell crisis  CHEST - 2 VIEW  Comparison: January 14, 2012  Findings:  Port-A-Cath tip is at the superior vena cava - right atrium junction.  No pneumothorax.  There is stable mild scarring in the lung bases.  Lungs are otherwise clear.  Heart is mildly enlarged with the a mild degree of pulmonary venous hypertension which is stable.  No adenopathy. No bone lesions are appreciated.  IMPRESSION: There is evidence of mild cardiac enlargement with mild pulmonary venous hypertension.  A degree of volume overload/high output state is suspected, consistent with known sickle cell disease.  There is no frank edema or consolidation.  Mild lung scarring is stable.   Original Report Authenticated By: Bretta Bang, M.D.    Ct Angio Chest Pe W/cm &/or Wo Cm  03/01/2012  *RADIOLOGY REPORT*  Clinical Data: Shortness of breath.  Evaluate for pulmonary embolism.  History of sickle cell disease.  CT ANGIOGRAPHY CHEST  Technique:  Multidetector CT imaging of the chest using the standard protocol during bolus administration of intravenous contrast. Multiplanar reconstructed images including MIPs were obtained and reviewed to evaluate the vascular anatomy.  Contrast: OMNIPAQUE IOHEXOL 350 MG/ML SOLN  Comparison: Chest CT 11/21/2011.  Findings:  Mediastinum: There are multiple small segmental and subsegmental sized filling defects within the pulmonary arteries to the lower lobes of the lungs bilaterally.  No larger central or lobar sized filling defects are noted at this time.  These emboli appear to be nonocclusive at this time.  The heart is moderately enlarged without definite signs of right-sided heart strain at this time. Left subclavian single lumen power Port-A-Cath with tip terminating in the right atrium. There is no significant pericardial fluid, thickening or  pericardial calcification.  Mildly enlarged right hilar lymph node (1.5 cm in diameter), similar to prior.  Numerous reactive size mediastinal and left hilar lymph nodes are noted. Esophagus is unremarkable in appearance.  Lungs/Pleura: Extensive thickening of the peribronchovascular interstitium and septal thickening throughout the lower lungs bilaterally, with multiple areas of mild architectural distortion. The overall appearance is very similar to the prior examination, most compatible with chronic areas of scarring.  No definite suspicious appearing pulmonary nodules or masses.  No definite acute consolidative air space disease.  No pleural effusions.  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  Cortical thinning in the posterior aspect of the upper pole of the left kidney, suggesting scarring.  Musculoskeletal: Multifocal sclerosis in the bones related to areas of avascular necrosis.  H shaped vertebral bodies compatible with the diagnosis of sickle cell disease. There are no aggressive appearing lytic or blastic lesions noted in the visualized portions of the skeleton.  IMPRESSION: 1.  Study is positive for segmental and subsegmental sized emboli to the lower lobes of the lungs bilaterally.  2.  Extensive chronic scarring throughout the lower lungs, similar to prior studies. 3.  Cardiomegaly. 4.  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 5.  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  Critical Value/emergent results were called by telephone at the time of interpretation on 03/01/2012  at 12:20 p.m. to Dr. Jeraldine Loots, who verbally acknowledged these results.   Original Report Authenticated By: Trudie Reed, M.D.    Disposition: Admit to [facility]   Diet: as tolerated   Activity: as tolerated    Follow-up  Appts:     Discharge Orders   Future Orders Complete By Expires     Activity as tolerated - No restrictions  As directed     Diet - low sodium heart healthy  As directed        TESTS THAT NEED FOLLOW-UP:   PT/INR scheduled for next week 03/28/12 with PCP   Time spent on discharge, talking to the patient, and coordinating care: 30  mins.  Signed: Ford Peddie P 03/20/2012, 11:36 AM

## 2012-03-20 NOTE — Progress Notes (Signed)
Patient discharge , will [facility] after d/c. PortaCath still inplace, was not deaccessed prior to discharged  Per MD order. Report was given to Darleen Crocker and made aware of patients port. Discharged ambulatory

## 2012-03-20 NOTE — H&P (Signed)
[facility] History and Physical   Date: 03/20/2012  Patient name: [name] Medical record number: [id number] Date of birth: [birth year] Age: 32 y.o. Gender: male PCP: [name] [name], MD  Attending physician: Altha Harm, MD  Chief Complaint: Sickle Cell Pain  History of Present Illness: The patient is a 32 y.o. 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 He was admitted to the hospital with increased pain after he ran out of his home pain medications x 5 days prior. He was also noted to be hypokalemic (K level 3.0) and his INR was subtherapeutic at 1.82. He was then admitted for pain control, and placed on PCA Dilaudid. His pain slowly improved but not manageable at home and he was then transitioned to the [facility] for ongoing pain management. He currently complains of pain to his bilateral ribs and thighs described as constant aching pain rated 7/10 prior to and 5-6/10 following pain medication administration. He currently denies fever, chills, headache, dizziness, nausea, vomiting, abdominal pain, LUTS, priapism, or any changes to his bowel or bladder functioning.  Meds: Prescriptions prior to admission  Medication Sig Dispense Refill  . folic acid (FOLVITE) 1 MG tablet Take 1 tablet (1 mg total) by mouth daily.  100 tablet  2  . HYDROmorphone (DILAUDID) 4 MG tablet Take 1-2 tablets (4-8 mg total) by mouth every 4 (four) hours as needed for pain. For pain.  60 tablet  0  . hydroxyurea (HYDREA) 500 MG capsule Take 1 capsule (500 mg total) by mouth 2 (two) times daily. May take with food to minimize GI side effects.  60 capsule  2  . ibuprofen (ADVIL,MOTRIN) 800 MG tablet Take 1 tablet (800 mg total) by mouth every 8 (eight) hours as needed for pain. Do not exceed use for more than 20/30 days  30 tablet  0  . lisinopril (PRINIVIL,ZESTRIL) 10 MG tablet Take 1 tablet (10 mg total) by mouth daily at 10 pm.  30 tablet   2  . OLANZapine (ZYPREXA) 10 MG tablet Take 10 mg by mouth at bedtime.      Marland Kitchen oxyCODONE (OXYCONTIN) 80 MG 12 hr tablet Take 1 tablet (80 mg total) by mouth every 12 (twelve) hours.  60 tablet  0  . potassium chloride SA (K-DUR,KLOR-CON) 20 MEQ tablet Take 1 tablet (20 mEq total) by mouth 2 (two) times daily.  60 tablet  1  . warfarin (COUMADIN) 5 MG tablet Take 5-7.5 mg by mouth daily. Takes 5 mg daily except Monday Wednesday and Friday he takes 7.5 mg      . zolpidem (AMBIEN) 10 MG tablet Take 1 tablet (10 mg total) by mouth at bedtime as needed. For sleep  30 tablet  2    Allergies: Morphine and related Past Medical History  Diagnosis Date  . Sickle cell anemia   . Blood transfusion   . 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 Hypokalemia   . Mood disorder   . Pulmonary embolism   . Avascular necrosis   . Leukocytosis     Chronic  . Thrombocytosis     Chronic  . Hypertension    Past Surgical History  Procedure Laterality Date  . Right hip replacement      08/2006  . Cholecystectomy      01/2008  . Porta cath placement    . Porta cath removal    . Umbilical hernia repair  01/2008  . Excision of left periauricular cyst      10/2009  . Excision of right ear lobe cyst with primary closur      11/2007  . Portacath placement  01/05/2012    Procedure: INSERTION PORT-A-CATH;  Surgeon: Adolph Pollack, MD;  Location: [facility] OR;  Service: General;  Laterality: N/A;  ultrasound guiced port a cath insertion with fluoroscopy   Family History  Problem Relation Age of Onset  . Sickle cell anemia Mother   . Sickle cell anemia Father   . Sickle cell trait Brother    History   Social History  . Marital Status: Single    Spouse Name: N/A    Number of Children: 0  . Years of Education: 13   Occupational History  . Unemployed    Social History Main Topics  . Smoking status: Former Smoker -- 13 years    Types: Cigarettes    Quit date: 07/08/2010  .  Smokeless tobacco: Never Used  . Alcohol Use: No  . Drug Use: No     Comment: quit smoking 2011  . Sexually Active: Yes    Birth Control/ Protection: None   Other Topics Concern  . Not on file   Social History Narrative   Lives in an apartment.  Single.  Lives with girlfriend.  Does not use any assist devices.        [name]:  [phone] Mom, emergency contact    Review of Systems: A comprehensive ROS negative except for complaints pain in his ribs, and thighs consistent with his typical crisis pain.  Physical Exam: Blood pressure 139/93, pulse 73, temperature 98.6 F (37 C), resp. rate 18, SpO2 98.00%.  General Appearance: Alert, Ox3, pleasant and cooperative, WD, WN, NAD.  Head: Normocephalic, atraumatic  Eyes: PERRL, slight strabismus, mild-mod icteric sclera  Back: Symmetric, no CVA tenderness  Resp: even and nonlabored, diminished bilateral bases, no vocal fremitus; no wheezes/rales/rhonchi  Cardio: S1, S2 regular, no murmurs/clicks/rubs/gallops  GI: Soft, NT, ND, + bowel sounds, no masses or organomegaly appreciated  Male Genitalia: Deferred; denies priapism  Extremities/MSK: Extremities normal, atraumatic, no cyanosis or edema, negative Homans; Left chest Port-A-Cath  Pulses: 2+ and symmetric  Skin: Skin intact  Neurologic: Grossly intact, no focal deficits  Psych: Appropriate affect   Lab results: Results for orders placed during the hospital encounter of 03/17/12 (from the past 24 hour(s))  CBC     Status: Abnormal   Collection Time    03/20/12  5:11 AM      Result Value Range   WBC 17.5 (*) 4.0 - 10.5 K/uL   RBC 2.24 (*) 4.22 - 5.81 MIL/uL   Hemoglobin 7.4 (*) 13.0 - 17.0 g/dL   HCT 09.8 (*) 11.9 - 14.7 %   MCV 95.1  78.0 - 100.0 fL   MCH 33.0  26.0 - 34.0 pg   MCHC 34.7  30.0 - 36.0 g/dL   RDW 82.9 (*) 56.2 - 13.0 %   Platelets 498 (*) 150 - 400 K/uL  PROTIME-INR     Status: Abnormal   Collection Time    03/20/12  5:11 AM      Result Value  Range   Prothrombin Time 25.5 (*) 11.6 - 15.2 seconds   INR 2.46 (*) 0.00 - 1.49    Imaging results:  No results found.   Assessment & Plan:  Sickle cell anemia with crisis: T. Bili trending down from recent admission and 1.7 this am. Ongoing pain  and transitioned to [facility] with resumption oral pain medications with IV Dilaudid for breakthrough prior to discharge.   Pulmonary embolism: recently diagnosed with a pulmonary embolism. He was subtherapeutic with INR 1.82 on admission. INR 2.46 this am and therapeutic on his home dose Coumadin.  Continue and follow with INR am prior to pending discharge.  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 Will need repeat Ferritin in the outpatient setting and LCSW to assist with Exjade after discharge.   Leukocytosis: WBC 17.5 this am prior to adm. Afebrile and felt secondary inflammatory process associated with SCD. We will continue IV Toradol while inpatient and have him resume his home dose po Motrin when discharged.   Hypokalemia: K level 3.0 on recent admission; replete at discharge and admit to [facility] with K level 4.0. Will continue his home medication regimen and re-evaluate with am labs.   Mood disorder: Continue home dose Zyprexa.   Hypertension: Blood pressure fairly well controlled on his home medication regimen. Will continue and follow.  Disposition: discharge after 23 hours OBS  KIRK, ANNA S 03/20/2012, 1:31 PM  Pt seen and examined and discussed with NP Vilinda Blanks. Agree with assessment and plan.

## 2012-03-20 NOTE — Progress Notes (Signed)
  Pharmacy Note (Brief) - Warfarin per MD  Patient has transferred over to [facility]. Warfarin will now be managed by MD/PA/NP there. Home dose has been reordered by provider. INR will be ordered daily x3 days per P&T protocol. Further INR orders per MD.  Current INR is therapeutic at 2.46.  Plan: 1.  Daily INR x3 days per P&T Protocol 2.  Further warfarin dose adjustment per MD 3.. Pharmacy to sign off.  Darrol Angel, PharmD Pager: [phone] 03/20/2012 1:53 PM

## 2012-03-20 NOTE — Progress Notes (Signed)
Patient put on his call light. This Rn in to answer. Patient states that he needs his IV pain medication. Rn informed patient that his IV pain medication is not yet due. Patient states that he had his last dose at 1730 and it should be due now. Rn informed patient that his IV pain medication is every 3 hours as needed and not every 2. Patient states              "what?". Rn informed him again that his pain medication is every 3 hours and not every 2. Patient appears to be annoyed by this as evidenced by his action of blowing air out of his mouth.

## 2012-03-20 NOTE — Progress Notes (Signed)
Patient puts his call light on again for his IV pain medication. Rn informed patient that he had scheduled po medication that is currently due. Patient states that he wants his IV pain medication instead. RN informed patient that his Po medication was scheduled and that he needed to take that medication at the scheduled times. Rn offered to bring the IV pain medication after the Po medication and also informed patient of taking the IV pain medication closer to a time before his scheduled IV desferol. Patient then states " desferol? That IV medication that takes four hours?" Rn answered yes. Patient states that he is not taking it and states that they should have left him in the hospital if they were "trying to do all of this". Patient states again that he is not gonna take desferol.

## 2012-03-21 DIAGNOSIS — D5700 Hb-SS disease with crisis, unspecified: Secondary | ICD-10-CM | POA: Diagnosis not present

## 2012-03-21 DIAGNOSIS — D72829 Elevated white blood cell count, unspecified: Secondary | ICD-10-CM | POA: Diagnosis not present

## 2012-03-21 LAB — COMPREHENSIVE METABOLIC PANEL
AST: 50 U/L — ABNORMAL HIGH (ref 0–37)
Albumin: 3.3 g/dL — ABNORMAL LOW (ref 3.5–5.2)
BUN: 6 mg/dL (ref 6–23)
Calcium: 8.3 mg/dL — ABNORMAL LOW (ref 8.4–10.5)
Chloride: 104 mEq/L (ref 96–112)
Creatinine, Ser: 0.59 mg/dL (ref 0.50–1.35)
Total Protein: 6.9 g/dL (ref 6.0–8.3)

## 2012-03-21 LAB — CBC WITH DIFFERENTIAL/PLATELET
Basophils Relative: 1 % (ref 0–1)
Eosinophils Relative: 12 % — ABNORMAL HIGH (ref 0–5)
HCT: 21 % — ABNORMAL LOW (ref 39.0–52.0)
Hemoglobin: 7 g/dL — ABNORMAL LOW (ref 13.0–17.0)
Lymphs Abs: 3.3 10*3/uL (ref 0.7–4.0)
MCH: 31.3 pg (ref 26.0–34.0)
MCV: 93.8 fL (ref 78.0–100.0)
Monocytes Absolute: 1.9 10*3/uL — ABNORMAL HIGH (ref 0.1–1.0)
Monocytes Relative: 13 % — ABNORMAL HIGH (ref 3–12)
Neutro Abs: 7.5 10*3/uL (ref 1.7–7.7)
Platelets: 470 10*3/uL — ABNORMAL HIGH (ref 150–400)
RBC: 2.24 MIL/uL — ABNORMAL LOW (ref 4.22–5.81)
WBC: 14.5 10*3/uL — ABNORMAL HIGH (ref 4.0–10.5)

## 2012-03-21 MED ORDER — OXYCODONE HCL 80 MG PO TB12: 80.0000 mg | ORAL_TABLET | Freq: Two times a day (BID) | ORAL | Status: DC

## 2012-03-21 MED ORDER — SODIUM CHLORIDE 0.9 % IJ SOLN
10.0000 mL | INTRAMUSCULAR | Status: AC | PRN
  Administered 2012-03-21: 10 mL

## 2012-03-21 MED ORDER — HYDROMORPHONE HCL PF 2 MG/ML IJ SOLN
2.0000 mg | Freq: Once | INTRAMUSCULAR | Status: AC
  Administered 2012-03-21: 2 mg via INTRAVENOUS

## 2012-03-21 MED ORDER — HYDROMORPHONE HCL 4 MG PO TABS
4.0000 mg | ORAL_TABLET | ORAL | Status: DC | PRN
Start: 2012-03-21 — End: 2012-04-14

## 2012-03-21 MED ORDER — HEPARIN SOD (PORK) LOCK FLUSH 100 UNIT/ML IV SOLN
500.0000 [IU] | INTRAVENOUS | Status: AC | PRN
  Administered 2012-03-21: 500 [IU]
  Filled 2012-03-21: qty 5

## 2012-03-21 NOTE — Discharge Summary (Signed)
[facility] Discharge Summary   Patient ID: [name] MRN: [id number] DOB/AGE: 1980/08/12 32 y.o.  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     HYDROmorphone 4 MG tablet  Commonly known as:  DILAUDID  Take 1-2 tablets (4-8 mg total) by mouth every 4 (four) hours as needed for pain. For pain.     hydroxyurea 500 MG capsule  Commonly known as:  HYDREA  Take 1 capsule (500 mg total) by mouth 2 (two) times daily. May take with food to minimize GI side effects.     ibuprofen 800 MG tablet  Commonly known as:  ADVIL,MOTRIN  Take 1 tablet (800 mg total) by mouth every 8 (eight) hours as needed for pain. Do not exceed use for more than 20/30 days     lisinopril 10 MG tablet  Commonly known as:  PRINIVIL,ZESTRIL  Take 1 tablet (10 mg total) by mouth daily at 10 pm.     OLANZapine 10 MG tablet  Commonly known as:  ZYPREXA  Take 10 mg by mouth at bedtime.     oxyCODONE 80 MG 12 hr tablet  Commonly known as:  OXYCONTIN  Take 1 tablet (80 mg total) by mouth every 12 (twelve) hours.     potassium chloride SA 20 MEQ tablet  Commonly known as:  K-DUR,KLOR-CON  Take 1 tablet (20 mEq total) by mouth 2 (two) times daily.     warfarin 5 MG tablet  Commonly known as:  COUMADIN  Take 5-7.5 mg by mouth daily. Takes 5 mg daily except Monday Wednesday and Friday he takes 7.5 mg     zolpidem 10 MG tablet  Commonly known as:  AMBIEN  Take 1 tablet (10 mg total) by mouth at bedtime as needed.  For sleep         Consults:  None  Significant Diagnostic Studies:  Dg Chest 2 View  03/17/2012  *RADIOLOGY REPORT*  Clinical Data: 32 year old male with chest pain - history of sickle cell disease.  CHEST - 2 VIEW  Comparison: 03/01/2012  Findings: Cardiomegaly and left-sided Port-A-Cath again noted. Mild interstitial prominence is again noted. There is no evidence of focal airspace disease, pulmonary edema, suspicious pulmonary nodule/mass, pleural effusion, or pneumothorax. No acute bony abnormalities are identified.  IMPRESSION: Cardiomegaly without evidence of acute cardiopulmonary disease   Original Report Authenticated By: Harmon Pier, M.D.    Dg Chest 2 View  03/01/2012  *RADIOLOGY REPORT*  Clinical Data: Sickle cell crisis  CHEST - 2 VIEW  Comparison: January 14, 2012  Findings:  Port-A-Cath tip is at the superior vena cava - right atrium junction.  No pneumothorax.  There is stable mild scarring in the lung bases.  Lungs are otherwise clear.  Heart is mildly enlarged with the a mild degree of pulmonary venous hypertension which is stable.  No adenopathy. No bone lesions are appreciated.  IMPRESSION: There is evidence of mild cardiac enlargement with mild pulmonary venous hypertension.  A degree of volume overload/high output state is  suspected, consistent with known sickle cell disease.  There is no frank edema or consolidation.  Mild lung scarring is stable.   Original Report Authenticated By: Bretta Bang, M.D.    Ct Angio Chest Pe W/cm &/or Wo Cm  03/01/2012  *RADIOLOGY REPORT*  Clinical Data: Shortness of breath.  Evaluate for pulmonary embolism.  History of sickle cell disease.  CT ANGIOGRAPHY CHEST  Technique:  Multidetector CT imaging of the chest using the standard protocol during bolus administration of intravenous contrast. Multiplanar reconstructed images including MIPs were obtained and reviewed to evaluate the vascular anatomy.  Contrast: OMNIPAQUE IOHEXOL 350 MG/ML  SOLN  Comparison: Chest CT 11/21/2011.  Findings:  Mediastinum: There are multiple small segmental and subsegmental sized filling defects within the pulmonary arteries to the lower lobes of the lungs bilaterally.  No larger central or lobar sized filling defects are noted at this time.  These emboli appear to be nonocclusive at this time.  The heart is moderately enlarged without definite signs of right-sided heart strain at this time. Left subclavian single lumen power Port-A-Cath with tip terminating in the right atrium. There is no significant pericardial fluid, thickening or pericardial calcification.  Mildly enlarged right hilar lymph node (1.5 cm in diameter), similar to prior.  Numerous reactive size mediastinal and left hilar lymph nodes are noted. Esophagus is unremarkable in appearance.  Lungs/Pleura: Extensive thickening of the peribronchovascular interstitium and septal thickening throughout the lower lungs bilaterally, with multiple areas of mild architectural distortion. The overall appearance is very similar to the prior examination, most compatible with chronic areas of scarring.  No definite suspicious appearing pulmonary nodules or masses.  No definite acute consolidative air space disease.  No pleural effusions.  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  Cortical thinning in the posterior aspect of the upper pole of the left kidney, suggesting scarring.  Musculoskeletal: Multifocal sclerosis in the bones related to areas of avascular necrosis.  H shaped vertebral bodies compatible with the diagnosis of sickle cell disease. There are no aggressive appearing lytic or blastic lesions noted in the visualized portions of the skeleton.  IMPRESSION: 1.  Study is positive for segmental and subsegmental sized emboli to the lower lobes of the lungs bilaterally.  2.  Extensive chronic scarring throughout the lower  lungs, similar to prior studies. 3.  Cardiomegaly. 4.  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 5.  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  Critical Value/emergent results were called by telephone at the time of interpretation on 03/01/2012  at 12:20 p.m. to Dr. Jeraldine Loots, who verbally acknowledged these results.   Original Report Authenticated By: Trudie Reed, M.D.      [facility] Course:  For complete details please refer to admission H and P, but in brief, Mr. Jamason Peckham is a 32 y.o. 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 He was admitted to the hospital with increased pain after he ran out of his home pain medications x 5 days prior. He was also noted to be hypokalemic (K level 3.0) and his INR was subtherapeutic at 1.82. He was then admitted for pain control, and placed on PCA Dilaudid. His pain slowly improved but not manageable at home and he was then transitioned to [facility] for ongoing  pain management. He was treated with Dilaudid 8mg  po scheduled every 4 hours, kept on his long-acting Oxycontin, and given IV Dilaudid prn for severe breakthrough pain. After 23 hours OBS his pain was felt manageable at home and he was given Rx refills of his home pain medications (Dilaudid 4 mg tabs, #60,0R; Oxycontin 80 mg tabs, # 30,0R). and discharged to return in 1 wk for labs and 2 wk for follow up office visit with Dr. [name]. He continued to deny fever, chills, headache, dizziness, nausea, vomiting, abdominal pain, LUTS, priapism, or any changes to his bowel or bladder functioning and was without acute distress prior to discharge.   Sickle cell anemia with crisis: ongoing resolving crisis and pain at the time of discharge with resuming his oral meds as noted above.   Pulmonary embolism: recently diagnosed with  a pulmonary embolism. He was subtherapeutic with INR 1.82 on recent admission. INR 2.46 and therapeutic on his home dose Coumadin when discharged to [facility]. He remained therapeutic with a repeat INR 2.32 prior to discharge home. He was without active bleeding and will have a repeat INR in 1 week to be followed by his PCP.   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   Secondary hemochromatosis: most recent Ferritin 3015 on 03/07/12; IV Desferal qhs while inpatient. Will need repeat Ferritin in the outpatient setting and LCSW to assist with Exjade after discharge.   Leukocytosis: WBC trended down to 14.5 prior to discharge. He remained afebrile and this was felt secondary inflammatory process associated with SCD. He will resume his home dose Motrin not to exceed 20 days/ month.   Hypokalemia: K level 3.0 on recent admission; replete at discharge and 4.0 when admitted to [facility]. He was 5.3 am of discharge and will have repeat BMP in 1 wk.   Mood disorder: remained on his home dose Zyprexa.   Hypertension: Blood pressure fairly well controlled on his home medication regimen. He will continue the same at discharge and follow up with his PCP in 2 weeks.   Physical Exam at Discharge:  BP 143/87  Pulse 61  Temp(Src) 97.6 F (36.4 C) (Oral)  Resp 18  SpO2 98%  General Appearance: Alert, Ox3, pleasant and cooperative, WD, WN, NAD.  Head: Normocephalic, atraumatic  Eyes: PERRL, slight strabismus, mild icteric sclera  Back: Symmetric, no CVA tenderness  Resp: even and nonlabored, diminished bilateral bases, no vocal fremitus; no wheezes/rales/rhonchi  Cardio: S1, S2 regular, no murmurs/clicks/rubs/gallops  GI: Soft, NT, ND, + bowel sounds, no masses or organomegaly appreciated  Male Genitalia: Deferred; denies priapism  Extremities/MSK: Extremities normal, atraumatic, no cyanosis or edema, negative Homans; Left chest Port-A-Cath  Pulses: 2+ and symmetric  Skin: Skin intact   Neurologic: Grossly intact, no focal deficits  Psych: Appropriate affect   Disposition at Discharge: 01-Home or Self Care  Discharge Orders: RTC on Wed 03/28/12 prior to 12 noon for labs (PT/INR, BMP) and ROV with Dr [name] on Thu 04/05/12 at 10:30  Condition at Discharge:   Stable  Time spent on Discharge:  Greater than 30 minutes.  We have conferred with Dr. Marthann Schiller regarding the plan of care for this patient  Signed: Lizbeth Bark 03/21/2012, 10:44 AM

## 2012-03-21 NOTE — Progress Notes (Addendum)
Np telephoned to unit. Informed her that patient stated that he was not taking desferol. NP asked to be transferred into patient room. Once conversation completed with patient, NP telephoned again to unit and asked was there a reason that patient was not able to get his IV pain medicine. RN stated that he had rec'd his IV pain medication. RN informed NP that patient had scheduled PO pain medication that was due at 2000. And that breakthrough IV pain medication was given approx. an hour after the PO was given. NP, states that patient may have pain medication IM if needed while desferol was running, and that he would agree to now take his desferol. When this nurse went into patient's room to administer his 2200 medications, patient was informed that he could have his pain medicine via IM if he needed it while desferol was going. Patient verbalizes understanding.

## 2012-03-21 NOTE — Progress Notes (Signed)
Patient ID: [name], male   DOB: 05/23/1980, 32 y.o.   MRN: [id number] Discharge instructions given to patient, along with follow up appointment dates/times, belongings returned, port de accessed and flushed per protocol, prescriptions given to patient for Dilaudid, and Oxycontin.

## 2012-03-21 NOTE — Progress Notes (Signed)
Patient completed desferol and didn't ask for any breakthrough pain medication. Patient rested in bed with eyes closed until RN in patient room to administer scheduled PO pain med at 0000. After medication administered, patient noted to be resting in bed with eyes closed again.

## 2012-03-26 ENCOUNTER — Encounter: Payer: Self-pay | Admitting: Hematology

## 2012-03-27 DIAGNOSIS — D473 Essential (hemorrhagic) thrombocythemia: Secondary | ICD-10-CM | POA: Diagnosis not present

## 2012-03-27 DIAGNOSIS — I2699 Other pulmonary embolism without acute cor pulmonale: Secondary | ICD-10-CM | POA: Diagnosis not present

## 2012-03-27 DIAGNOSIS — I10 Essential (primary) hypertension: Secondary | ICD-10-CM | POA: Diagnosis not present

## 2012-03-27 DIAGNOSIS — G8929 Other chronic pain: Secondary | ICD-10-CM | POA: Diagnosis not present

## 2012-03-27 DIAGNOSIS — D5700 Hb-SS disease with crisis, unspecified: Secondary | ICD-10-CM | POA: Diagnosis not present

## 2012-03-27 DIAGNOSIS — I82C19 Acute embolism and thrombosis of unspecified internal jugular vein: Secondary | ICD-10-CM | POA: Diagnosis not present

## 2012-03-27 DIAGNOSIS — F39 Unspecified mood [affective] disorder: Secondary | ICD-10-CM | POA: Diagnosis not present

## 2012-03-27 DIAGNOSIS — E876 Hypokalemia: Secondary | ICD-10-CM | POA: Diagnosis not present

## 2012-03-27 DIAGNOSIS — D72829 Elevated white blood cell count, unspecified: Secondary | ICD-10-CM | POA: Diagnosis not present

## 2012-03-28 ENCOUNTER — Non-Acute Institutional Stay (HOSPITAL_COMMUNITY)
Admission: AD | Admit: 2012-03-28 | Discharge: 2012-03-29 | Disposition: A | Discharge status: Home / Self Care | Payer: Medicare Other | Source: Other Acute Inpatient Hospital | Attending: Internal Medicine | Admitting: Internal Medicine

## 2012-03-28 ENCOUNTER — Non-Acute Institutional Stay (HOSPITAL_COMMUNITY): Payer: Medicare Other

## 2012-03-28 ENCOUNTER — Telehealth (HOSPITAL_COMMUNITY): Payer: Self-pay | Admitting: *Deleted

## 2012-03-28 ENCOUNTER — Encounter (HOSPITAL_COMMUNITY): Payer: Self-pay | Admitting: Hematology

## 2012-03-28 DIAGNOSIS — F39 Unspecified mood [affective] disorder: Secondary | ICD-10-CM | POA: Diagnosis not present

## 2012-03-28 DIAGNOSIS — E876 Hypokalemia: Secondary | ICD-10-CM | POA: Insufficient documentation

## 2012-03-28 DIAGNOSIS — D5700 Hb-SS disease with crisis, unspecified: Secondary | ICD-10-CM | POA: Diagnosis not present

## 2012-03-28 DIAGNOSIS — G8929 Other chronic pain: Secondary | ICD-10-CM | POA: Diagnosis not present

## 2012-03-28 DIAGNOSIS — R071 Chest pain on breathing: Secondary | ICD-10-CM | POA: Diagnosis not present

## 2012-03-28 DIAGNOSIS — Z86711 Personal history of pulmonary embolism: Secondary | ICD-10-CM | POA: Diagnosis not present

## 2012-03-28 DIAGNOSIS — R0609 Other forms of dyspnea: Secondary | ICD-10-CM | POA: Insufficient documentation

## 2012-03-28 DIAGNOSIS — D571 Sickle-cell disease without crisis: Secondary | ICD-10-CM | POA: Diagnosis not present

## 2012-03-28 DIAGNOSIS — R0989 Other specified symptoms and signs involving the circulatory and respiratory systems: Secondary | ICD-10-CM | POA: Insufficient documentation

## 2012-03-28 DIAGNOSIS — D72829 Elevated white blood cell count, unspecified: Secondary | ICD-10-CM | POA: Insufficient documentation

## 2012-03-28 DIAGNOSIS — I10 Essential (primary) hypertension: Secondary | ICD-10-CM | POA: Insufficient documentation

## 2012-03-28 DIAGNOSIS — R42 Dizziness and giddiness: Secondary | ICD-10-CM | POA: Diagnosis not present

## 2012-03-28 DIAGNOSIS — M79609 Pain in unspecified limb: Secondary | ICD-10-CM | POA: Diagnosis not present

## 2012-03-28 DIAGNOSIS — R52 Pain, unspecified: Secondary | ICD-10-CM | POA: Insufficient documentation

## 2012-03-28 LAB — COMPREHENSIVE METABOLIC PANEL
ALT: 16 U/L (ref 0–53)
AST: 34 U/L (ref 0–37)
CO2: 22 mEq/L (ref 19–32)
Chloride: 104 mEq/L (ref 96–112)
GFR calc Af Amer: >90 mL/min (ref >90)
GFR calc non Af Amer: >90 mL/min (ref >90)
Glucose, Bld: 99 mg/dL (ref 70–99)
Sodium: 136 mEq/L (ref 135–145)
Total Bilirubin: 4.1 mg/dL — ABNORMAL HIGH (ref 0.3–1.2)

## 2012-03-28 LAB — CBC WITH DIFFERENTIAL/PLATELET
Basophils Absolute: 0.2 10*3/uL — ABNORMAL HIGH (ref 0.0–0.1)
Eosinophils Absolute: 0.8 10*3/uL — ABNORMAL HIGH (ref 0.0–0.7)
MCH: 31.9 pg (ref 26.0–34.0)
MCHC: 28.9 g/dL — ABNORMAL LOW (ref 30.0–36.0)
Monocytes Absolute: 2.3 10*3/uL — ABNORMAL HIGH (ref 0.1–1.0)
Neutrophils Relative %: 70 % (ref 43–77)
Platelets: 628 10*3/uL — ABNORMAL HIGH (ref 150–400)
RDW: 21.6 % — ABNORMAL HIGH (ref 11.5–15.5)

## 2012-03-28 LAB — RETICULOCYTES: Retic Count, Absolute: 273.7 10*3/uL — ABNORMAL HIGH (ref 19.0–186.0)

## 2012-03-28 MED ORDER — SODIUM CHLORIDE 0.9 % IJ SOLN
10.0000 mL | INTRAMUSCULAR | Status: AC | PRN
  Administered 2012-03-29: 10 mL

## 2012-03-28 MED ORDER — AZITHROMYCIN 500 MG PO TABS
500.0000 mg | ORAL_TABLET | Freq: Every day | ORAL | Status: AC
  Administered 2012-03-28: 500 mg via ORAL
  Filled 2012-03-28: qty 1

## 2012-03-28 MED ORDER — AZITHROMYCIN 250 MG PO TABS
250.0000 mg | ORAL_TABLET | Freq: Every day | ORAL | Status: DC
Start: 2012-03-29 — End: 2012-03-29
  Administered 2012-03-29: 250 mg via ORAL
  Filled 2012-03-28 (×2): qty 1

## 2012-03-28 MED ORDER — ONDANSETRON HCL 8 MG PO TABS: 4.0000 mg | ORAL_TABLET | ORAL | Status: DC | PRN

## 2012-03-28 MED ORDER — HYDROMORPHONE HCL PF 2 MG/ML IJ SOLN
2.0000 mg | INTRAMUSCULAR | Status: DC | PRN
Start: 2012-03-28 — End: 2012-03-29
  Administered 2012-03-28 – 2012-03-29 (×10): 4 mg via INTRAVENOUS
  Filled 2012-03-28 (×10): qty 2

## 2012-03-28 MED ORDER — HEPARIN SOD (PORK) LOCK FLUSH 100 UNIT/ML IV SOLN
500.0000 [IU] | INTRAVENOUS | Status: AC | PRN
  Administered 2012-03-29: 500 [IU]

## 2012-03-28 MED ORDER — FOLIC ACID 1 MG PO TABS
1.0000 mg | ORAL_TABLET | Freq: Every day | ORAL | Status: DC
  Administered 2012-03-28 – 2012-03-29 (×2): 1 mg via ORAL
  Filled 2012-03-28 (×2): qty 1

## 2012-03-28 MED ORDER — DEXTROSE 5 % IV SOLN
2000.0000 mg | Freq: Every day | INTRAVENOUS | Status: DC
  Administered 2012-03-28: 2000 mg via INTRAVENOUS
  Filled 2012-03-28: qty 2

## 2012-03-28 MED ORDER — DIPHENHYDRAMINE HCL 25 MG PO CAPS: 25.0000 mg | ORAL_CAPSULE | ORAL | Status: DC | PRN

## 2012-03-28 MED ORDER — HYDROXYUREA 500 MG PO CAPS
500.0000 mg | ORAL_CAPSULE | Freq: Two times a day (BID) | ORAL | Status: DC
  Administered 2012-03-28 – 2012-03-29 (×3): 500 mg via ORAL
  Filled 2012-03-28 (×4): qty 1

## 2012-03-28 MED ORDER — WARFARIN SODIUM 7.5 MG PO TABS
7.5000 mg | ORAL_TABLET | ORAL | Status: DC
  Administered 2012-03-28: 7.5 mg via ORAL
  Filled 2012-03-28: qty 1

## 2012-03-28 MED ORDER — POTASSIUM CHLORIDE CRYS ER 20 MEQ PO TBCR
20.0000 meq | EXTENDED_RELEASE_TABLET | Freq: Two times a day (BID) | ORAL | Status: DC
  Administered 2012-03-28 – 2012-03-29 (×3): 20 meq via ORAL
  Filled 2012-03-28 (×3): qty 1

## 2012-03-28 MED ORDER — WARFARIN SODIUM 5 MG PO TABS
5.0000 mg | ORAL_TABLET | ORAL | Status: AC
  Administered 2012-03-29: 5 mg via ORAL
  Filled 2012-03-28 (×2): qty 1

## 2012-03-28 MED ORDER — WARFARIN - PHYSICIAN DOSING INPATIENT
Freq: Every day | Status: DC
  Administered 2012-03-28: 17:00:00

## 2012-03-28 MED ORDER — DIPHENHYDRAMINE HCL 50 MG/ML IJ SOLN
12.5000 mg | INTRAMUSCULAR | Status: DC | PRN
  Administered 2012-03-28 (×3): 25 mg via INTRAVENOUS
  Filled 2012-03-28 (×3): qty 1

## 2012-03-28 MED ORDER — PROMETHAZINE HCL 25 MG PO TABS: 12.5000 mg | ORAL_TABLET | ORAL | Status: DC | PRN

## 2012-03-28 MED ORDER — KETOROLAC TROMETHAMINE 15 MG/ML IJ SOLN
15.0000 mg | Freq: Four times a day (QID) | INTRAMUSCULAR | Status: DC
  Administered 2012-03-28 – 2012-03-29 (×3): 15 mg via INTRAVENOUS
  Filled 2012-03-28 (×5): qty 1

## 2012-03-28 MED ORDER — PROMETHAZINE HCL 12.5 MG RE SUPP: 12.5000 mg | RECTAL | Status: DC | PRN

## 2012-03-28 MED ORDER — WARFARIN SODIUM 5 MG PO TABS: 5.0000 mg | ORAL_TABLET | Freq: Every day | ORAL | Status: DC

## 2012-03-28 MED ORDER — OLANZAPINE 10 MG PO TABS
10.0000 mg | ORAL_TABLET | Freq: Every day | ORAL | Status: DC
  Administered 2012-03-28: 10 mg via ORAL
  Filled 2012-03-28 (×2): qty 1

## 2012-03-28 MED ORDER — OXYCODONE HCL ER 80 MG PO T12A
80.0000 mg | EXTENDED_RELEASE_TABLET | Freq: Two times a day (BID) | ORAL | Status: DC
  Administered 2012-03-28 – 2012-03-29 (×2): 80 mg via ORAL
  Filled 2012-03-28 (×2): qty 1

## 2012-03-28 MED ORDER — LISINOPRIL 10 MG PO TABS
10.0000 mg | ORAL_TABLET | Freq: Every day | ORAL | Status: DC
  Administered 2012-03-28: 10 mg via ORAL
  Filled 2012-03-28 (×3): qty 1

## 2012-03-28 MED ORDER — ZOLPIDEM TARTRATE 5 MG PO TABS: 10.0000 mg | ORAL_TABLET | Freq: Every evening | ORAL | Status: DC | PRN

## 2012-03-28 MED ORDER — DEXTROSE-NACL 5-0.45 % IV SOLN
INTRAVENOUS | Status: DC
  Administered 2012-03-28 (×2): via INTRAVENOUS

## 2012-03-28 MED ORDER — SENNOSIDES-DOCUSATE SODIUM 8.6-50 MG PO TABS
1.0000 | ORAL_TABLET | Freq: Two times a day (BID) | ORAL | Status: DC
  Administered 2012-03-28 – 2012-03-29 (×2): 1 via ORAL
  Filled 2012-03-28 (×2): qty 1

## 2012-03-28 MED ORDER — ONDANSETRON HCL 4 MG/2ML IJ SOLN: 4.0000 mg | INTRAMUSCULAR | Status: DC | PRN

## 2012-03-28 NOTE — Progress Notes (Signed)
Ambulated patient in hallway, O2 dropped to 86% R/A, patient encouraged to take slow, deep breathes thru nose, and Oxygen up to 93% R/A.  Tobi Bastos, NP notified.  Will put patient on 2L, and transport for chest xray.  Patient denies shortness of breath, dizziness, or difficulty breathing at this time.

## 2012-03-28 NOTE — H&P (Signed)
[facility] History and Physical   Date: 03/28/2012  Patient name: [name] Medical record number: [id number] Date of birth: 03-06-1980 Age: 32 y.o. Gender: male PCP: [name], [name], MD  Attending physician: Altha Harm, MD  Chief Complaint: increased pain bilateral legs, and bilateral sides (ribs) since early this am (5am) not relieved with his home pain medication regimen.  History of Present Illness: [name] is a 32 y.o. 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 He called the [facility] this am and during triage voiced complaints as noted above and he was advised to come to the clinic for further evaluation and treatment acute on chronic pain secondary to SCD and possible early Sickle Cell Crisis. However on arrival, he reported progressively increasing pain bilateral legs, and bilateral sides (ribs, and diaphragm) since Sunday 03/25/12, not associated with chest pain, SOB, palpitations, or relieved with his home pain medication regimen. He described this pain as constant, sharp, aching, and currently rated 8/10 with a normal baseline 5/10. He also reports having "hot flashes" all week....especially today". He acknowledges being "out in the snow a lot" recently and having some allergy symptoms (sniffing, sneezing, rhinorrhea) and productive cough since Saturday 03/24/12. He describes this cough as occasional but increasing in frequency with light green phlegm. He currently denies fever, chills, headache, nausea, vomiting, abdominal pain, LUTS, or priapism, but reports having some dizziness this am with DOE while walking and was noted to have ongoing dizziness when asked to sit up for exam and reported increased pleural pain when asked take a deep breath. He will be admitted for 23 hours OBS for further evaluation and treatment DOE with pleural pain and acute on chronic pain secondary to SCD with  possible early crisis.   Meds: Prescriptions prior to admission  Medication Sig Dispense Refill  . folic acid (FOLVITE) 1 MG tablet Take 1 tablet (1 mg total) by mouth daily.  100 tablet  2  . HYDROmorphone (DILAUDID) 4 MG tablet Take 1-2 tablets (4-8 mg total) by mouth every 4 (four) hours as needed for pain. For pain.  60 tablet  0  . hydroxyurea (HYDREA) 500 MG capsule Take 1 capsule (500 mg total) by mouth 2 (two) times daily. May take with food to minimize GI side effects.  60 capsule  2  . ibuprofen (ADVIL,MOTRIN) 800 MG tablet Take 1 tablet (800 mg total) by mouth every 8 (eight) hours as needed for pain. Do not exceed use for more than 20/30 days  30 tablet  0  . lisinopril (PRINIVIL,ZESTRIL) 10 MG tablet Take 1 tablet (10 mg total) by mouth daily at 10 pm.  30 tablet  2  . OLANZapine (ZYPREXA) 10 MG tablet Take 10 mg by mouth at bedtime.      Marland Kitchen oxyCODONE (OXYCONTIN) 80 MG 12 hr tablet Take 1 tablet (80 mg total) by mouth every 12 (twelve) hours.  30 tablet  0  . potassium chloride SA (K-DUR,KLOR-CON) 20 MEQ tablet Take 1 tablet (20 mEq total) by mouth 2 (two) times daily.  60 tablet  1  . warfarin (COUMADIN) 5 MG tablet Take 5-7.5 mg by mouth daily. Takes 5 mg daily except Monday Wednesday and Friday he takes 7.5 mg      . zolpidem (AMBIEN) 10 MG tablet Take 1 tablet (10 mg total) by mouth at bedtime as needed. For sleep  30 tablet  2    Allergies:  Morphine and related Past Medical History  Diagnosis Date  . Sickle cell anemia   . Blood transfusion   . 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 Hypokalemia   . Mood disorder   . Pulmonary embolism   . Avascular necrosis   . Leukocytosis     Chronic  . Thrombocytosis     Chronic  . Hypertension    Past Surgical History  Procedure Laterality Date  . Right hip replacement      08/2006  . Cholecystectomy      01/2008  . Porta cath placement    . Porta cath removal    . Umbilical hernia repair       01/2008  . Excision of left periauricular cyst      10/2009  . Excision of right ear lobe cyst with primary closur      11/2007  . Portacath placement  01/05/2012    Procedure: INSERTION PORT-A-CATH;  Surgeon: Adolph Pollack, MD;  [facility] OR;  Service: General;  Laterality: N/A;  ultrasound guiced port a cath insertion with fluoroscopy   Family History  Problem Relation Age of Onset  . Sickle cell anemia Mother   . Sickle cell anemia Father   . Sickle cell trait Brother    History   Social History  . Marital Status: Single    Spouse Name: N/A    Number of Children: 0  . Years of Education: 13   Occupational History  . Unemployed    Social History Main Topics  . Smoking status: Former Smoker -- 13 years    Types: Cigarettes    Quit date: 07/08/2010  . Smokeless tobacco: Never Used  . Alcohol Use: No  . Drug Use: No     Comment: quit smoking 2011  . Sexually Active: Yes    Birth Control/ Protection: None   Other Topics Concern  . Not on file   Social History Narrative   Lives in an apartment.  Single.  Lives with girlfriend.  Does not use any assist devices.        [name]:  [phone] Mom, emergency contact    Review of Systems: As noted HPI; otherwise negative  Physical Exam: Blood pressure 115/73, pulse 62, temperature 99 F (37.2 C), temperature source Oral, resp. rate 18, weight 77.111 kg (170 lb), SpO2 100.00%.  General Appearance: Alert, Ox3, pleasant and cooperative, WD, WN, NAD.  Head: Normocephalic, atraumatic, no sinus tenderness, MMM, oralpharynx clear without exudate  Eyes: PERRL, slight strabismus, mild icteric sclera  Back: Symmetric, no CVA tenderness  Resp: even and nonlabored, diminished bilateral bases, no vocal fremitus; no wheezes/rales/rhonchi  Cardio: S1, S2 regular, no murmurs/clicks/rubs/gallops  GI: Soft, NT, ND, + bowel sounds, no masses or organomegaly appreciated  Male Genitalia: Deferred; denies priapism   Extremities/MSK: Extremities normal, atraumatic, no cyanosis or edema, negative Homans; Left chest Port-A-Cath  Pulses: 2+ and symmetric  Skin: Skin intact  Neurologic: Grossly intact, no focal deficits  Psych: Appropriate affect   Lab results: Results for orders placed during the hospital encounter of 03/28/12 (from the past 24 hour(s))  CBC WITH DIFFERENTIAL     Status: Abnormal (Preliminary result)   Collection Time    03/28/12 11:40 AM      Result Value Range   WBC 18.8 (*) 4.0 - 10.5 K/uL   RBC 2.38 (*) 4.22 - 5.81 MIL/uL   Hemoglobin 7.6 (*) 13.0 - 17.0 g/dL   HCT  26.3 (*) 39.0 - 52.0 %   MCV 110.5 (*) 78.0 - 100.0 fL   MCH 31.9  26.0 - 34.0 pg   MCHC 28.9 (*) 30.0 - 36.0 g/dL   RDW 95.6 (*) 21.3 - 08.6 %   Platelets 628 (*) 150 - 400 K/uL   Neutrophils Relative PENDING  43 - 77 %   Neutro Abs PENDING  1.7 - 7.7 K/uL   Band Neutrophils PENDING  0 - 10 %   Lymphocytes Relative PENDING  12 - 46 %   Lymphs Abs PENDING  0.7 - 4.0 K/uL   Monocytes Relative PENDING  3 - 12 %   Monocytes Absolute PENDING  0.1 - 1.0 K/uL   Eosinophils Relative PENDING  0 - 5 %   Eosinophils Absolute PENDING  0.0 - 0.7 K/uL   Basophils Relative PENDING  0 - 1 %   Basophils Absolute PENDING  0.0 - 0.1 K/uL   WBC Morphology PENDING     RBC Morphology PENDING     Smear Review PENDING     nRBC PENDING  0 /100 WBC   Metamyelocytes Relative PENDING     Myelocytes PENDING     Promyelocytes Absolute PENDING     Blasts PENDING    RETICULOCYTES     Status: Abnormal   Collection Time    03/28/12 11:40 AM      Result Value Range   Retic Ct Pct 11.5 (*) 0.4 - 3.1 %   RBC. 2.38 (*) 4.22 - 5.81 MIL/uL   Retic Count, Manual 273.7 (*) 19.0 - 186.0 K/uL    Imaging results:  No results found.   Assessment & Plan: 1) DOE/pleural pain (bilateral ribs and diaphragm)- recent exposure cold and increased risk ACS/pneumonia with associated productive cough and low grade temp (99); will get ambulatory pulse  ox; check orthostatic BP's and obtain CXR for now   2) Leukocytosis/questionable URI- WBC 18.8 with mild left shift and low grade temp; suspect multifactorial; inflammation with SCD vs infectious process; IV Toradol; encouraged incentive spirometry, empirically treat with Z-pack and await results CXR   3) Sickle Cell Disease with possible early crisis- SS Disease with Retic 11.5%, T. Bili 4.1 on admission and evidence of active hemolysis. We will give gentle IV hydration, anti-emetic prn, anti-pruretic prn, and start a bowel management regimen. His most recent Hgb electrophoresis was obtained on 12/17/11 in which his Hgb F was 6.1% .We will continue his current home dose Hydrea and folic acid daily; We will treat his pain with Dilaudid 2-4 mg IV every 2 hrs prn and continue his long-acting Oxycontin 80 mg BID.    4) History recent Pulmonary Embolism- recent new Dx PE following CTA chest 03/05/12 and discharged on Coumadin. Since then he was re-admitted 2/15-2/18/14 and noted to have a subtherapeutic INR 1.82 on admission. He was discharged therapeutic and remains therapeutic with INR 2.63 on admission today; we will continue his home dose Coumadin and despite Azithromycin least likely macrolide to alter INR, we will have him return to see his PCP on Friday for repeat INR  5) Anemia of SCD- Hgb 7.6 on adm and stable; re-evaluate with am labs following fluid resuscitation.  6) Secondary Hemochromatosis- On Exjade daily at home; most recent Ferritin level 3015 on 03/07/12. We will start IV Desferal q hs while here and resume his home dose Exjade at discharge.   7) HTN/Dizziness- BP soft on adm; negative orthostatics; continue his home dose of Lisinopril 10mg  po daily  with parameters to hold for SBP <110.    8) Hypokalemia, chronic- K level 3.5 on adm; we will continue his home dose of potassium supplement (20 meq BID) and e-evaluate with am labs.   9) Mood Disorder- pleasant on adm; continue his home dose  of Zyprexa   10) Disposition- unclear at this time; re-evaluate following results CXR and am labs   We have discussed the plan of care for this patient with Dr. Horton Finer, Aison Malveaux S 03/28/2012, 12:23 PM

## 2012-03-28 NOTE — Telephone Encounter (Signed)
Received patient call c/o bil side and leg pain, rating 8/10. Pain Starting early this Morning about 5AM. Patient has taken oxycontin and dilaudid at approximately 5am and 9am with little relief for a little while but pain comes right back. Also c/o runny nose. Reports vomiting a couple days ago but none since. Denies all other pertinent ROS per [facility] triage assessment. Informed patient I am to notify provider of his information and return call with recommendation. Patient acknowledges.

## 2012-03-29 DIAGNOSIS — M79609 Pain in unspecified limb: Secondary | ICD-10-CM | POA: Diagnosis not present

## 2012-03-29 DIAGNOSIS — R071 Chest pain on breathing: Secondary | ICD-10-CM | POA: Diagnosis not present

## 2012-03-29 DIAGNOSIS — D571 Sickle-cell disease without crisis: Secondary | ICD-10-CM | POA: Diagnosis not present

## 2012-03-29 DIAGNOSIS — G8929 Other chronic pain: Secondary | ICD-10-CM | POA: Diagnosis not present

## 2012-03-29 DIAGNOSIS — R52 Pain, unspecified: Secondary | ICD-10-CM | POA: Diagnosis not present

## 2012-03-29 DIAGNOSIS — R0609 Other forms of dyspnea: Secondary | ICD-10-CM | POA: Diagnosis not present

## 2012-03-29 DIAGNOSIS — R0989 Other specified symptoms and signs involving the circulatory and respiratory systems: Secondary | ICD-10-CM | POA: Diagnosis not present

## 2012-03-29 DIAGNOSIS — F39 Unspecified mood [affective] disorder: Secondary | ICD-10-CM | POA: Diagnosis not present

## 2012-03-29 DIAGNOSIS — D5700 Hb-SS disease with crisis, unspecified: Secondary | ICD-10-CM | POA: Diagnosis not present

## 2012-03-29 LAB — CBC WITH DIFFERENTIAL/PLATELET
Basophils Relative: 1 % (ref 0–1)
Eosinophils Relative: 6 % — ABNORMAL HIGH (ref 0–5)
Hemoglobin: 6.9 g/dL — CL (ref 13.0–17.0)
Lymphs Abs: 5.3 10*3/uL — ABNORMAL HIGH (ref 0.7–4.0)
MCH: 31.8 pg (ref 26.0–34.0)
MCV: 95.4 fL (ref 78.0–100.0)
Monocytes Absolute: 3 10*3/uL — ABNORMAL HIGH (ref 0.1–1.0)
RBC: 2.17 MIL/uL — ABNORMAL LOW (ref 4.22–5.81)

## 2012-03-29 MED ORDER — AZITHROMYCIN 250 MG PO TABS: ORAL_TABLET | ORAL | Status: DC

## 2012-03-29 NOTE — Discharge Summary (Signed)
[facility] Discharge Summary   Patient ID: [name] MRN: [id number] DOB/AGE: 08-29-1980 32 y.o.  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     HYDROmorphone 4 MG tablet  Commonly known as:  DILAUDID  Take 1-2 tablets (4-8 mg total) by mouth every 4 (four) hours as needed for pain. For pain.     hydroxyurea 500 MG capsule  Commonly known as:  HYDREA  Take 1 capsule (500 mg total) by mouth 2 (two) times daily. May take with food to minimize GI side effects.     ibuprofen 800 MG tablet  Commonly known as:  ADVIL,MOTRIN  Take 1 tablet (800 mg total) by mouth every 8 (eight) hours as needed for pain. Do not exceed use for more than 20/30 days     lisinopril 10 MG tablet  Commonly known as:  PRINIVIL,ZESTRIL  Take 1 tablet (10 mg total) by mouth daily at 10 pm.     OLANZapine 10 MG tablet  Commonly known as:  ZYPREXA  Take 10 mg by mouth at bedtime.     oxyCODONE 80 MG 12 hr tablet  Commonly known as:  OXYCONTIN  Take 1 tablet (80 mg total) by mouth every 12 (twelve) hours.     potassium chloride SA 20 MEQ tablet  Commonly known as:  K-DUR,KLOR-CON  Take 1 tablet (20 mEq total) by mouth 2 (two) times daily.     warfarin 5 MG tablet  Commonly known as:  COUMADIN  Take 5-7.5 mg by mouth daily. Takes 5 mg daily except Monday Wednesday and Friday he takes 7.5  mg     zolpidem 10 MG tablet  Commonly known as:  AMBIEN  Take 1 tablet (10 mg total) by mouth at bedtime as needed. For sleep         Consults:  None  Significant Diagnostic Studies:  Dg Chest 2 View  03/28/2012  *RADIOLOGY REPORT*  Clinical Data: Dyspnea on exertion  CHEST - 2 VIEW  Comparison: 03/17/2012  Findings: Left chest wall porta-catheter is noted with tip in the projection of the cavoatrial junction.  There is mild cardiac enlargement.  There is no pleural effusion or edema.  No airspace consolidation noted.  IMPRESSION:  1.  No acute cardiopulmonary abnormalities.   Original Report Authenticated By: Signa Kell, M.D.    Dg Chest 2 View  03/17/2012  *RADIOLOGY REPORT*  Clinical Data: 32 year old male with chest pain - history of sickle cell disease.  CHEST - 2 VIEW  Comparison: 03/01/2012  Findings: Cardiomegaly and left-sided Port-A-Cath again noted. Mild interstitial prominence is again noted. There is no evidence of focal airspace disease, pulmonary edema, suspicious pulmonary nodule/mass, pleural effusion, or pneumothorax. No acute bony abnormalities are identified.  IMPRESSION: Cardiomegaly without evidence of acute cardiopulmonary disease   Original Report Authenticated By: Harmon Pier,  M.D.    Dg Chest 2 View  03/01/2012  *RADIOLOGY REPORT*  Clinical Data: Sickle cell crisis  CHEST - 2 VIEW  Comparison: January 14, 2012  Findings:  Port-A-Cath tip is at the superior vena cava - right atrium junction.  No pneumothorax.  There is stable mild scarring in the lung bases.  Lungs are otherwise clear.  Heart is mildly enlarged with the a mild degree of pulmonary venous hypertension which is stable.  No adenopathy. No bone lesions are appreciated.  IMPRESSION: There is evidence of mild cardiac enlargement with mild pulmonary venous hypertension.  A degree of volume overload/high output state is suspected, consistent with known sickle cell disease.  There is no frank edema or  consolidation.  Mild lung scarring is stable.   Original Report Authenticated By: Bretta Bang, M.D.    Ct Angio Chest Pe W/cm &/or Wo Cm  03/01/2012  *RADIOLOGY REPORT*  Clinical Data: Shortness of breath.  Evaluate for pulmonary embolism.  History of sickle cell disease.  CT ANGIOGRAPHY CHEST  Technique:  Multidetector CT imaging of the chest using the standard protocol during bolus administration of intravenous contrast. Multiplanar reconstructed images including MIPs were obtained and reviewed to evaluate the vascular anatomy.  Contrast: OMNIPAQUE IOHEXOL 350 MG/ML SOLN  Comparison: Chest CT 11/21/2011.  Findings:  Mediastinum: There are multiple small segmental and subsegmental sized filling defects within the pulmonary arteries to the lower lobes of the lungs bilaterally.  No larger central or lobar sized filling defects are noted at this time.  These emboli appear to be nonocclusive at this time.  The heart is moderately enlarged without definite signs of right-sided heart strain at this time. Left subclavian single lumen power Port-A-Cath with tip terminating in the right atrium. There is no significant pericardial fluid, thickening or pericardial calcification.  Mildly enlarged right hilar lymph node (1.5 cm in diameter), similar to prior.  Numerous reactive size mediastinal and left hilar lymph nodes are noted. Esophagus is unremarkable in appearance.  Lungs/Pleura: Extensive thickening of the peribronchovascular interstitium and septal thickening throughout the lower lungs bilaterally, with multiple areas of mild architectural distortion. The overall appearance is very similar to the prior examination, most compatible with chronic areas of scarring.  No definite suspicious appearing pulmonary nodules or masses.  No definite acute consolidative air space disease.  No pleural effusions.  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  Cortical thinning in the posterior aspect of the upper pole of the left kidney, suggesting scarring.  Musculoskeletal: Multifocal sclerosis in the bones related to areas of avascular necrosis.  H shaped vertebral bodies compatible with the diagnosis of sickle cell disease. There are no aggressive appearing lytic or blastic lesions noted in the visualized portions of the skeleton.  IMPRESSION: 1.  Study is positive for segmental and subsegmental sized emboli to the lower lobes of the lungs bilaterally.  2.  Extensive chronic scarring throughout the lower lungs, similar to prior studies. 3.  Cardiomegaly. 4.  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 5.  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  Critical Value/emergent results were called by telephone at the time of interpretation on 03/01/2012  at 12:20 p.m. to Dr. Jeraldine Loots, who verbally acknowledged these results.   Original Report Authenticated By: Trudie Reed, M.D.      [facility] Course:  For complete details please refer to admission H and P, but in brief, [name] is a 32 y.o. 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 He contacted the [facility] prior to admission with complaints acute increase pain to his legs and ribs felt consistent with his typical crisis, however, on arrival, he also had complaints sniffing, sneezing, rhinorrhea, productive cough, dizziness, DOE, and "hot flashes" since Sunday 03/25/12. These complaints were further evaluated with a negative CXR, negative orthostatic vital signs, and an ambulatory pulse ox of 86%. Initial labs revealed Retic 11.5%, T. Bili 4.1, WBC 18.8, Hgb 7.6. He was empirically treated with Azithromycin for questionable URI and will complete a 5 day course of treatment. Following  fluid resuscitation and incentive spirometry use during the night, he was able to maintain an ambulatory pulse Ox of 98-99% this am. He remained afebrile and his dizziness with ambulation resolved. Repeat labs this am revealed WBC 19.9, Hgb 6.9 prior to discharge. He was asymptomatic to his anemia with 7.0-7.5 his baseline. Leukocytosis was felt secondary to inflammatory process SCD and he was given low dose IV Toradol and will resume po Motrin at home. He has been instructed to limit the use of Motrin and monitor for active bleeding noting his recent PE and use of oral anticoagulation, which he has been instructed to address further with his PCP. He remained on his home dose Coumadin and was noted to have a therapeutic INR 2.63.  His pain was aggressively treated with IV Dilaudid and continuing his long-acting pain medication. His pain improved and was felt manageable at home this am. We did confer with his PCP, Dr. [name], and as recommended, he will continue his home pain medication regimen, have a repeat CBC, and INR in his office on Monday 04/02/12. He will also keep his previously scheduled follow up appointment with him on Thursday 04/05/12. He had no complaints, no active bleeding, and was not in any acute distress at the time of discharge.    Physical Exam at Discharge:  BP 108/65  Pulse 65  Temp(Src) 98.4 F (36.9 C) (Oral)  Resp 18  Wt 77.111 kg (170 lb)  BMI 23.05 kg/m2  SpO2 99%  General Appearance: Alert, Ox3, pleasant and cooperative, WD, WN, NAD.  Head: Normocephalic, atraumatic, no sinus tenderness, MMM, oralpharynx clear without exudate  Eyes: PERRL, slight strabismus, mild icteric sclera  Back: Symmetric, no CVA tenderness  Resp: even and nonlabored, diminished bilateral bases, no vocal fremitus; no wheezes/rales/rhonchi  Cardio: S1, S2 regular, no murmurs/clicks/rubs/gallops  GI: Soft, NT, ND, + bowel sounds, no masses or organomegaly appreciated  Male Genitalia: Deferred; denies  priapism  Extremities/MSK: Extremities normal, atraumatic, no cyanosis or edema, negative Homans; Left chest Port-A-Cath  Pulses: 2+ and symmetric  Skin: Skin intact  Neurologic: Grossly intact, no focal deficits  Psych: Appropriate affect   Disposition at Discharge: 01-Home or Self Care  Discharge Orders: labs in office on Monday 04/02/12 and keep previously scheduled ROV Dr. [name] on Thu 04/05/12   Condition at Discharge:   Stable  Time spent on Discharge:  Greater than 30 minutes.  SignedLyda Perone, Arriyanna Mersch S 03/29/2012, 10:10 AM

## 2012-03-29 NOTE — Progress Notes (Signed)
[facility]. Vitals stable. No complications.  Discharge instructions given.

## 2012-03-29 NOTE — Progress Notes (Signed)
Gerald Passe, NP notified of critical labs, Hgb of 6.9.   Pt asymptomatic, no complaints of  pain, dizziness or difficulty breathing. No new orders given at this time.  Will continue to monitor patient.

## 2012-04-05 NOTE — H&P (Signed)
Pt examined and assessment and plan discussed with NP Vilinda Blanks. Agree with above.

## 2012-04-05 NOTE — Discharge Summary (Signed)
Pt discussed with NP Vilinda Blanks. Agree with discharge to home.

## 2012-04-05 NOTE — Discharge Summary (Signed)
Pt seen and examined and discussed with nurse practitioner [name]. I was assessment and plan.

## 2012-04-06 ENCOUNTER — Emergency Department (HOSPITAL_COMMUNITY): Payer: Medicare Other

## 2012-04-06 ENCOUNTER — Inpatient Hospital Stay (HOSPITAL_COMMUNITY)
Admission: EM | Admit: 2012-04-06 | Discharge: 2012-04-14 | DRG: 812 | Disposition: A | Discharge status: Home / Self Care | Payer: Medicare Other | Attending: Internal Medicine | Admitting: Internal Medicine

## 2012-04-06 ENCOUNTER — Encounter (HOSPITAL_COMMUNITY): Payer: Self-pay | Admitting: Emergency Medicine

## 2012-04-06 DIAGNOSIS — M8700 Idiopathic aseptic necrosis of unspecified bone: Secondary | ICD-10-CM

## 2012-04-06 DIAGNOSIS — Z87891 Personal history of nicotine dependence: Secondary | ICD-10-CM

## 2012-04-06 DIAGNOSIS — I10 Essential (primary) hypertension: Secondary | ICD-10-CM

## 2012-04-06 DIAGNOSIS — I2699 Other pulmonary embolism without acute cor pulmonale: Secondary | ICD-10-CM

## 2012-04-06 DIAGNOSIS — F39 Unspecified mood [affective] disorder: Secondary | ICD-10-CM | POA: Diagnosis present

## 2012-04-06 DIAGNOSIS — I2782 Chronic pulmonary embolism: Secondary | ICD-10-CM | POA: Diagnosis present

## 2012-04-06 DIAGNOSIS — Z791 Long term (current) use of non-steroidal anti-inflammatories (NSAID): Secondary | ICD-10-CM | POA: Diagnosis not present

## 2012-04-06 DIAGNOSIS — D5700 Hb-SS disease with crisis, unspecified: Secondary | ICD-10-CM | POA: Diagnosis not present

## 2012-04-06 DIAGNOSIS — D72829 Elevated white blood cell count, unspecified: Secondary | ICD-10-CM | POA: Diagnosis not present

## 2012-04-06 DIAGNOSIS — Z79899 Other long term (current) drug therapy: Secondary | ICD-10-CM

## 2012-04-06 DIAGNOSIS — R6889 Other general symptoms and signs: Secondary | ICD-10-CM | POA: Diagnosis not present

## 2012-04-06 DIAGNOSIS — Z7901 Long term (current) use of anticoagulants: Secondary | ICD-10-CM | POA: Diagnosis not present

## 2012-04-06 DIAGNOSIS — J984 Other disorders of lung: Secondary | ICD-10-CM | POA: Diagnosis not present

## 2012-04-06 DIAGNOSIS — I82C29 Chronic embolism and thrombosis of unspecified internal jugular vein: Secondary | ICD-10-CM | POA: Diagnosis present

## 2012-04-06 DIAGNOSIS — D473 Essential (hemorrhagic) thrombocythemia: Secondary | ICD-10-CM

## 2012-04-06 DIAGNOSIS — I82C11 Acute embolism and thrombosis of right internal jugular vein: Secondary | ICD-10-CM

## 2012-04-06 DIAGNOSIS — D571 Sickle-cell disease without crisis: Secondary | ICD-10-CM

## 2012-04-06 DIAGNOSIS — M549 Dorsalgia, unspecified: Secondary | ICD-10-CM | POA: Diagnosis not present

## 2012-04-06 DIAGNOSIS — E876 Hypokalemia: Secondary | ICD-10-CM | POA: Diagnosis not present

## 2012-04-06 LAB — RETICULOCYTES
RBC.: 2.55 MIL/uL — ABNORMAL LOW (ref 4.22–5.81)
Retic Ct Pct: 13.9 % — ABNORMAL HIGH (ref 0.4–3.1)

## 2012-04-06 LAB — CBC WITH DIFFERENTIAL/PLATELET
Basophils Relative: 0 % (ref 0–1)
Eosinophils Absolute: 0.6 10*3/uL (ref 0.0–0.7)
HCT: 28.2 % — ABNORMAL LOW (ref 39.0–52.0)
Hemoglobin: 8.2 g/dL — ABNORMAL LOW (ref 13.0–17.0)
MCH: 32.2 pg (ref 26.0–34.0)
MCHC: 29.1 g/dL — ABNORMAL LOW (ref 30.0–36.0)
Monocytes Absolute: 2.4 10*3/uL — ABNORMAL HIGH (ref 0.1–1.0)
Monocytes Relative: 10 % (ref 3–12)
Neutro Abs: 19 10*3/uL — ABNORMAL HIGH (ref 1.7–7.7)

## 2012-04-06 LAB — COMPREHENSIVE METABOLIC PANEL
Albumin: 4.2 g/dL (ref 3.5–5.2)
BUN: 5 mg/dL — ABNORMAL LOW (ref 6–23)
Chloride: 102 mEq/L (ref 96–112)
Creatinine, Ser: 0.48 mg/dL — ABNORMAL LOW (ref 0.50–1.35)
GFR calc Af Amer: >90 mL/min (ref >90)
GFR calc non Af Amer: >90 mL/min (ref >90)
Glucose, Bld: 105 mg/dL — ABNORMAL HIGH (ref 70–99)
Total Bilirubin: 4 mg/dL — ABNORMAL HIGH (ref 0.3–1.2)

## 2012-04-06 LAB — LACTATE DEHYDROGENASE: LDH: 377 U/L — ABNORMAL HIGH (ref 94–250)

## 2012-04-06 LAB — PROTIME-INR: Prothrombin Time: 16.4 seconds — ABNORMAL HIGH (ref 11.6–15.2)

## 2012-04-06 MED ORDER — HYDROMORPHONE HCL PF 2 MG/ML IJ SOLN
4.0000 mg | INTRAMUSCULAR | Status: DC
  Administered 2012-04-06 – 2012-04-08 (×19): 4 mg via INTRAVENOUS
  Filled 2012-04-06 (×19): qty 2

## 2012-04-06 MED ORDER — SODIUM CHLORIDE 0.9 % IV SOLN
INTRAVENOUS | Status: AC
  Administered 2012-04-06: 11:00:00 via INTRAVENOUS

## 2012-04-06 MED ORDER — LISINOPRIL 10 MG PO TABS
10.0000 mg | ORAL_TABLET | Freq: Every day | ORAL | Status: DC
  Administered 2012-04-06 – 2012-04-13 (×8): 10 mg via ORAL
  Filled 2012-04-06 (×9): qty 1

## 2012-04-06 MED ORDER — POTASSIUM CHLORIDE CRYS ER 20 MEQ PO TBCR
40.0000 meq | EXTENDED_RELEASE_TABLET | Freq: Once | ORAL | Status: AC
  Administered 2012-04-06: 40 meq via ORAL
  Filled 2012-04-06: qty 2

## 2012-04-06 MED ORDER — FOLIC ACID 1 MG PO TABS
1.0000 mg | ORAL_TABLET | Freq: Every morning | ORAL | Status: DC
  Administered 2012-04-06 – 2012-04-14 (×9): 1 mg via ORAL
  Filled 2012-04-06 (×9): qty 1

## 2012-04-06 MED ORDER — ONDANSETRON HCL 4 MG/2ML IJ SOLN: 4.0000 mg | Freq: Three times a day (TID) | INTRAMUSCULAR | Status: AC | PRN

## 2012-04-06 MED ORDER — HYDROMORPHONE HCL PF 2 MG/ML IJ SOLN
3.0000 mg | INTRAMUSCULAR | Status: DC
  Administered 2012-04-06 (×4): 3 mg via INTRAVENOUS
  Filled 2012-04-06 (×2): qty 2
  Filled 2012-04-06: qty 1
  Filled 2012-04-06: qty 2

## 2012-04-06 MED ORDER — WARFARIN SODIUM 5 MG PO TABS: 5.0000 mg | ORAL_TABLET | Freq: Every evening | ORAL | Status: DC

## 2012-04-06 MED ORDER — HYDROMORPHONE HCL PF 1 MG/ML IJ SOLN
1.0000 mg | Freq: Once | INTRAMUSCULAR | Status: AC
  Administered 2012-04-06: 1 mg via INTRAVENOUS
  Filled 2012-04-06: qty 1

## 2012-04-06 MED ORDER — ZOLPIDEM TARTRATE 10 MG PO TABS
10.0000 mg | ORAL_TABLET | Freq: Every evening | ORAL | Status: DC | PRN
  Administered 2012-04-08: 10 mg via ORAL
  Filled 2012-04-06: qty 1

## 2012-04-06 MED ORDER — WARFARIN - PHARMACIST DOSING INPATIENT
Freq: Every day | Status: DC
  Administered 2012-04-09: 12:00:00

## 2012-04-06 MED ORDER — KETOROLAC TROMETHAMINE 30 MG/ML IJ SOLN
30.0000 mg | Freq: Once | INTRAMUSCULAR | Status: AC
  Administered 2012-04-06: 30 mg via INTRAVENOUS
  Filled 2012-04-06: qty 1

## 2012-04-06 MED ORDER — HYDROMORPHONE HCL PF 2 MG/ML IJ SOLN
2.0000 mg | Freq: Once | INTRAMUSCULAR | Status: AC
  Administered 2012-04-06: 2 mg via INTRAVENOUS
  Filled 2012-04-06: qty 1

## 2012-04-06 MED ORDER — KETOROLAC TROMETHAMINE 30 MG/ML IJ SOLN
30.0000 mg | Freq: Four times a day (QID) | INTRAMUSCULAR | Status: AC
  Administered 2012-04-06 – 2012-04-11 (×18): 30 mg via INTRAVENOUS
  Filled 2012-04-06 (×24): qty 1

## 2012-04-06 MED ORDER — OXYCODONE HCL ER 40 MG PO T12A
80.0000 mg | EXTENDED_RELEASE_TABLET | Freq: Two times a day (BID) | ORAL | Status: DC
  Administered 2012-04-06 – 2012-04-14 (×17): 80 mg via ORAL
  Filled 2012-04-06 (×17): qty 2

## 2012-04-06 MED ORDER — OLANZAPINE 10 MG PO TABS
10.0000 mg | ORAL_TABLET | Freq: Every day | ORAL | Status: DC
  Administered 2012-04-06 – 2012-04-13 (×8): 10 mg via ORAL
  Filled 2012-04-06 (×9): qty 1

## 2012-04-06 MED ORDER — DIPHENHYDRAMINE HCL 25 MG PO CAPS
25.0000 mg | ORAL_CAPSULE | Freq: Four times a day (QID) | ORAL | Status: DC | PRN
  Administered 2012-04-06: 25 mg via ORAL
  Filled 2012-04-06: qty 1

## 2012-04-06 MED ORDER — KCL IN DEXTROSE-NACL 20-5-0.45 MEQ/L-%-% IV SOLN
INTRAVENOUS | Status: DC
  Administered 2012-04-06: 75 mL/h via INTRAVENOUS
  Administered 2012-04-07 – 2012-04-08 (×3): via INTRAVENOUS
  Filled 2012-04-06 (×7): qty 1000

## 2012-04-06 MED ORDER — OXYCODONE HCL 80 MG PO TB12: 80.0000 mg | ORAL_TABLET | Freq: Two times a day (BID) | ORAL | Status: DC

## 2012-04-06 MED ORDER — SODIUM CHLORIDE 0.9 % IV BOLUS (SEPSIS)
1000.0000 mL | Freq: Once | INTRAVENOUS | Status: AC
  Administered 2012-04-06: 1000 mL via INTRAVENOUS

## 2012-04-06 MED ORDER — HYDROXYUREA 500 MG PO CAPS
500.0000 mg | ORAL_CAPSULE | Freq: Two times a day (BID) | ORAL | Status: DC
  Administered 2012-04-06 – 2012-04-14 (×17): 500 mg via ORAL
  Filled 2012-04-06 (×20): qty 1

## 2012-04-06 MED ORDER — SENNA 8.6 MG PO TABS
1.0000 | ORAL_TABLET | Freq: Two times a day (BID) | ORAL | Status: DC
  Administered 2012-04-06 – 2012-04-14 (×8): 8.6 mg via ORAL
  Filled 2012-04-06 (×8): qty 1

## 2012-04-06 MED ORDER — HYDROMORPHONE HCL PF 1 MG/ML IJ SOLN: 1.0000 mg | INTRAMUSCULAR | Status: DC | PRN

## 2012-04-06 MED ORDER — WARFARIN SODIUM 10 MG PO TABS
10.0000 mg | ORAL_TABLET | Freq: Once | ORAL | Status: AC
  Administered 2012-04-06: 10 mg via ORAL
  Filled 2012-04-06: qty 1

## 2012-04-06 NOTE — ED Notes (Signed)
[id number]<AV> Expected date:<BR> Expected time:<BR> Means of arrival:<BR> Comments:<BR> EMS/sickle cell crisis/side and chest pain

## 2012-04-06 NOTE — ED Notes (Signed)
Pt denies chest pain

## 2012-04-06 NOTE — Progress Notes (Signed)
ANTICOAGULATION CONSULT NOTE - Initial Consult  Pharmacy Consult for Warfarin Indication:  History of PE/DVT  Allergies  Allergen Reactions  . Morphine And Related Hives and Rash    Pt states he is able to tolerate Dilaudid with no reactions.    Patient Measurements: Height: 6' (182.9 cm) Weight: 168 lb 3.2 oz (76.295 kg) IBW/kg (Calculated) : 77.6  Vital Signs: Temp: 98.6 F (37 C) (03/07 1210) Temp src: Oral (03/07 0848) BP: 111/69 mmHg (03/07 1210) Pulse Rate: 70 (03/07 1210)  Labs:  Recent Labs  04/06/12 0535 04/06/12 1500  HGB 8.2*  --   HCT 28.2*  --   PLT 514*  --   LABPROT  --  16.4*  INR  --  1.35  CREATININE 0.48*  --     Estimated Creatinine Clearance: 143.1 ml/min (by C-G formula based on Cr of 0.48).   Medical History: Past Medical History  Diagnosis Date  . Sickle cell anemia   . Blood transfusion   . 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 Hypokalemia   . Mood disorder   . Pulmonary embolism   . Avascular necrosis   . Leukocytosis     Chronic  . Thrombocytosis     Chronic  . Hypertension     Assessment: 42 yoM with sickle cell admitted for pain in ribs and back.  Pt on chronic coumadin PTA for h/o PE/DVT.  Pharmacy asked to resume coumadin.    INR on admission is subtherapeutic @ 1.35.  Last INR 2/26 therapeutic.    CBC ok, no bleeding noted  PTA dose: 5mg  daily except 7.5mg  MWF  Pt on regular diet  No drug-drug interactions noted  Goal of Therapy:  INR 2-3 Monitor platelets by anticoagulation protocol: Yes   Plan:  1.  Coumadin 10mg  PO x 1 tonight 2.  Daily PT/INR  Haynes Hoehn, PharmD 04/06/2012 4:06 PM  Pager: [phone]

## 2012-04-06 NOTE — ED Provider Notes (Signed)
Pt report received at end of shift.  Pt has long hx of sickle cell anemia presents with sicklecell related pain.  This is his typical pain.  No suspicion of acute chest syndrome.  WIll continue with pain management and will dispo appropriately.    7:08 AM Patient reports no improvement of symptoms after receiving 2 mg of Dilaudid, and Toradol 30 mg via IV. We'll continue with pain management. He appears to be in no acute distress at this time. His vital signs stable and afebrile.  BP 132/83  Pulse 78  Temp(Src) 98.8 F (37.1 C) (Oral)  Resp 17  SpO2 96%  On examination he appeared in NAD. Vital signs as documented. Skin warm and dry and without overt rashes. Neck without JVD. Lungs clear. Mild tenderness to R anterior ribs with palpation without crepitus or overlying skin changes. Heart exam notable for regular rhythm, normal sounds and absence of murmurs, rubs or gallops. Abdomen unremarkable and without evidence of organomegaly, masses, or abdominal aortic enlargement. Extremities nonedematous.  8:16 AM Since patient is complaining of rib pain, and he has an elevated white count above from his baseline with a left shift, will obtain a chest x-ray to rule out pneumonia. Patient otherwise denies short of breath, cough productive cough, or fever.  9:55 AM CXR shows no acute finding concerning for infection.  Pt reports no improvement of sxs despite receiving a total of 4mg  of Dilaudid and 30mg  of toradol in a span of 4 hrs.  Perhaps this is undertreatment for this patient.  I have consulted with Sickle Cell specialist, Dr. Ashley Royalty, who has reviewed pt's lab and recommend medical admission for further management.  She also recommend giving pt 4mg  of dilaudid q2hrs PRN.    BP 114/80  Pulse 74  Temp(Src) 98.2 F (36.8 C) (Oral)  Resp 16  SpO2 99%  10:25 AM I have consulted with [facility], Dr. Cena Benton who agrees to admit pt to med surg, team 4, under his care.    1. Sickle cell anemia  with pain  I have reviewed nursing notes and vital signs. I personally reviewed the imaging tests through PACS system  I reviewed available ER/hospitalization records thought the EMR  Results for orders placed during the hospital encounter of 04/06/12  CBC WITH DIFFERENTIAL      Result Value Range   WBC 23.9 (*) 4.0 - 10.5 K/uL   RBC 2.55 (*) 4.22 - 5.81 MIL/uL   Hemoglobin 8.2 (*) 13.0 - 17.0 g/dL   HCT 16.1 (*) 09.6 - 04.5 %   MCV 110.6 (*) 78.0 - 100.0 fL   MCH 32.2  26.0 - 34.0 pg   MCHC 29.1 (*) 30.0 - 36.0 g/dL   RDW 40.9 (*) 81.1 - 91.4 %   Platelets 514 (*) 150 - 400 K/uL   Neutrophils Relative 80 (*) 43 - 77 %   Neutro Abs 19.0 (*) 1.7 - 7.7 K/uL   Lymphocytes Relative 7 (*) 12 - 46 %   Lymphs Abs 1.7  0.7 - 4.0 K/uL   Monocytes Relative 10  3 - 12 %   Monocytes Absolute 2.4 (*) 0.1 - 1.0 K/uL   Eosinophils Relative 3  0 - 5 %   Eosinophils Absolute 0.6  0.0 - 0.7 K/uL   Basophils Relative 0  0 - 1 %   Basophils Absolute 0.1  0.0 - 0.1 K/uL  COMPREHENSIVE METABOLIC PANEL      Result Value Range   Sodium 135  135 - 145 mEq/L   Potassium 3.0 (*) 3.5 - 5.1 mEq/L   Chloride 102  96 - 112 mEq/L   CO2 22  19 - 32 mEq/L   Glucose, Bld 105 (*) 70 - 99 mg/dL   BUN 5 (*) 6 - 23 mg/dL   Creatinine, Ser 1.61 (*) 0.50 - 1.35 mg/dL   Calcium 8.8  8.4 - 09.6 mg/dL   Total Protein 8.1  6.0 - 8.3 g/dL   Albumin 4.2  3.5 - 5.2 g/dL   AST 21  0 - 37 U/L   ALT 12  0 - 53 U/L   Alkaline Phosphatase 85  39 - 117 U/L   Total Bilirubin 4.0 (*) 0.3 - 1.2 mg/dL   GFR calc non Af Amer >90  >90 mL/min   GFR calc Af Amer >90  >90 mL/min  RETICULOCYTES      Result Value Range   Retic Ct Pct 13.9 (*) 0.4 - 3.1 %   RBC. 2.55 (*) 4.22 - 5.81 MIL/uL   Retic Count, Manual 354.5 (*) 19.0 - 186.0 K/uL   Dg Chest 2 View  04/06/2012  *RADIOLOGY REPORT*  Clinical Data: Chest pain.  Sickle cell crisis.  CHEST - 2 VIEW  Comparison: Radiographs 03/28/2012.  CT 03/01/2012.  Findings: The left  subclavian power port is unchanged in the lower SVC.  Heart size and mediastinal contours are stable.  Chronic central airway thickening at both lung bases appears stable.  There is no superimposed airspace disease, pleural effusion or pneumothorax.  IMPRESSION: Stable chronic lung disease.  No acute cardiopulmonary process identified.   Original Report Authenticated By: Carey Bullocks, M.D.    Dg Chest 2 View  03/28/2012  *RADIOLOGY REPORT*  Clinical Data: Dyspnea on exertion  CHEST - 2 VIEW  Comparison: 03/17/2012  Findings: Left chest wall porta-catheter is noted with tip in the projection of the cavoatrial junction.  There is mild cardiac enlargement.  There is no pleural effusion or edema.  No airspace consolidation noted.  IMPRESSION:  1.  No acute cardiopulmonary abnormalities.   Original Report Authenticated By: Signa Kell, M.D.    Dg Chest 2 View  03/17/2012  *RADIOLOGY REPORT*  Clinical Data: 32 year old male with chest pain - history of sickle cell disease.  CHEST - 2 VIEW  Comparison: 03/01/2012  Findings: Cardiomegaly and left-sided Port-A-Cath again noted. Mild interstitial prominence is again noted. There is no evidence of focal airspace disease, pulmonary edema, suspicious pulmonary nodule/mass, pleural effusion, or pneumothorax. No acute bony abnormalities are identified.  IMPRESSION: Cardiomegaly without evidence of acute cardiopulmonary disease   Original Report Authenticated By: Harmon Pier, M.D.       Fayrene Helper, PA-C 04/06/12 1027

## 2012-04-06 NOTE — ED Notes (Signed)
Pt alert and oriented x4. Respirations even and unlabored. Bilateral rise and fall of chest. Skin warm and dry. In no acute distress. Denies needs.  

## 2012-04-06 NOTE — ED Notes (Signed)
PA at bedside.

## 2012-04-06 NOTE — ED Notes (Signed)
Informed PA pain has not subsided.

## 2012-04-06 NOTE — H&P (Signed)
[facility] History and Physical  [name]:[id number] DOB: 05-21-1980 DOA: 04/06/2012  Referring physician: Dr. Rulon Abide PCP: [name] [name], MD  Specialists: as indicated above  Chief Complaint: Pain at ribs and back  HPI: [name] is a 32 y.o. 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  His pain is located at his ribs and back. He states that this is typical of his sickle cell when his sickle cell related pain flares up.  He denies any fevers, chills, sick contacts, dysuria, difficulty breathing, cough, or SOB.  The pain has been present and constant since onset.  Reportedly per patient is work him up at 12 am last night.  Nothing makes it better.  Given the persistence of the discomfort with minimal relief from home pain medications patient came to the ED for further evaluation.  In the ED chest x ray was obtained given complaint of pain at ribs which was interpreted as no acute cardiopulmonary process.  We were consulted for further evaluation and recommendations given sickle cell related pain  Review of Systems: 10 point review of system reviewed and negative unless otherwise mentioned above  Past Medical History  Diagnosis Date  . Sickle cell anemia   . Blood transfusion   . 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 Hypokalemia   . Mood disorder   . Pulmonary embolism   . Avascular necrosis   . Leukocytosis     Chronic  . Thrombocytosis     Chronic  . Hypertension    Past Surgical History  Procedure Laterality Date  . Right hip replacement      08/2006  . Cholecystectomy      01/2008  . Porta cath placement    . Porta cath removal    . Umbilical hernia repair      01/2008  . Excision of left periauricular cyst      10/2009  . Excision of right ear lobe cyst with primary closur      11/2007  . Portacath placement  01/05/2012    Procedure: INSERTION  PORT-A-CATH;  Surgeon: Adolph Pollack, MD;  Location: [facility] OR;  Service: General;  Laterality: N/A;  ultrasound guiced port a cath insertion with fluoroscopy   Social History:  reports that he quit smoking about 20 months ago. His smoking use included Cigarettes. He smoked 0.00 packs per day for 13 years. He has never used smokeless tobacco. He reports that he does not drink alcohol or use illicit drugs.  where does patient live--home, ALF, SNF? and with whom if at home? Lives at home  Can patient participate in ADLs? yes  Allergies  Allergen Reactions  . Morphine And Related Hives and Rash    Pt states he is able to tolerate Dilaudid with no reactions.    Family History  Problem Relation Age of Onset  . Sickle cell anemia Mother   . Sickle cell anemia Father   . Sickle cell trait Brother   He reports that mother had diabetes  Prior to Admission medications   Medication Sig Start Date End Date Taking? Authorizing Sander Speckman  folic acid (FOLVITE) 1 MG tablet Take 1 mg by mouth every morning. 01/04/12  Yes Gwenyth Bender, MD  HYDROmorphone (DILAUDID) 4 MG tablet Take 1-2 tablets (4-8 mg total) by mouth every 4 (four) hours as needed for pain. For pain. 03/21/12  Yes  Lizbeth Bark, FNP  hydroxyurea (HYDREA) 500 MG capsule Take 1 capsule (500 mg total) by mouth 2 (two) times daily. May take with food to minimize GI side effects. 01/04/12  Yes Gwenyth Bender, MD  ibuprofen (ADVIL,MOTRIN) 800 MG tablet Take 1 tablet (800 mg total) by mouth every 8 (eight) hours as needed for pain. Do not exceed use for more than 20/30 days 01/23/12  Yes Altha Harm, MD  lisinopril (PRINIVIL,ZESTRIL) 10 MG tablet Take 1 tablet (10 mg total) by mouth daily at 10 pm. 01/04/12  Yes Gwenyth Bender, MD  OLANZapine (ZYPREXA) 10 MG tablet Take 10 mg by mouth at bedtime. 11/08/11  Yes Gwenyth Bender, MD  oxyCODONE (OXYCONTIN) 80 MG 12 hr tablet Take 1 tablet (80 mg total) by mouth every 12 (twelve) hours. 03/21/12  Yes Lizbeth Bark,  FNP  potassium chloride SA (K-DUR,KLOR-CON) 20 MEQ tablet Take 1 tablet (20 mEq total) by mouth 2 (two) times daily. 01/04/12  Yes Gwenyth Bender, MD  warfarin (COUMADIN) 5 MG tablet Take 5-7.5 mg by mouth every evening. Takes 5 mg daily except Monday Wednesday and Friday he takes 7.5 mg 03/05/12  Yes Altha Harm, MD  zolpidem (AMBIEN) 10 MG tablet Take 1 tablet (10 mg total) by mouth at bedtime as needed. For sleep 02/09/12  Yes Grayce Sessions, NP  azithromycin [facility]) 250 MG tablet Take as Rx until completed 03/30/12   Lizbeth Bark, FNP   Physical Exam: Filed Vitals:   04/06/12 0900 04/06/12 0930 04/06/12 1000 04/06/12 1030  BP: 108/72 114/80 107/69 110/68  Pulse: 66 74 70 73  Temp:      TempSrc:      Resp: 16 16 17 19   SpO2: 99% 99% 98% 98%     General:  Pt in NAD, Alert and Awake  Eyes: EOMI, non icteric  ENT: normal exterior appearance  Neck: supple, no goiter  Cardiovascular: RRR, No MRG  Respiratory: CTA BL, no wheezes  Abdomen: soft, NT, ND  Skin: warm and dry  Musculoskeletal: no cyanosis or clubbing  Psychiatric: mood and affect appropriate  Neurologic: answers questions appropriately,moves all extremities equally, no facial asymmetry  Labs on Admission:  Basic Metabolic Panel:  Recent Labs Lab 04/06/12 0535  NA 135  K 3.0*  CL 102  CO2 22  GLUCOSE 105*  BUN 5*  CREATININE 0.48*  CALCIUM 8.8   Liver Function Tests:  Recent Labs Lab 04/06/12 0535  AST 21  ALT 12  ALKPHOS 85  BILITOT 4.0*  PROT 8.1  ALBUMIN 4.2   No results found for this basename: LIPASE, AMYLASE,  in the last 168 hours No results found for this basename: AMMONIA,  in the last 168 hours CBC:  Recent Labs Lab 04/06/12 0535  WBC 23.9*  NEUTROABS 19.0*  HGB 8.2*  HCT 28.2*  MCV 110.6*  PLT 514*   Cardiac Enzymes: No results found for this basename: CKTOTAL, CKMB, CKMBINDEX, TROPONINI,  in the last 168 hours  BNP (last 3 results)  Recent Labs   06/07/11 0650 07/05/11 0620 07/18/11 1500  PROBNP 7.4 219.1* 13.5   CBG: No results found for this basename: GLUCAP,  in the last 168 hours  Radiological Exams on Admission: Dg Chest 2 View  04/06/2012  *RADIOLOGY REPORT*  Clinical Data: Chest pain.  Sickle cell crisis.  CHEST - 2 VIEW  Comparison: Radiographs 03/28/2012.  CT 03/01/2012.  Findings: The left subclavian power port is unchanged in  the lower SVC.  Heart size and mediastinal contours are stable.  Chronic central airway thickening at both lung bases appears stable.  There is no superimposed airspace disease, pleural effusion or pneumothorax.  IMPRESSION: Stable chronic lung disease.  No acute cardiopulmonary process identified.   Original Report Authenticated By: Carey Bullocks, M.D.     Assessment/Plan Principal Problem:   Sickle cell pain crisis Active Problems:   Leukocytosis   Hypokalemia   Sickle cell anemia DVT/PE   1. Sickle cell pain crisis - At this juncture there is no evidence of infection - will continue home regimen, change dilaudid to IV 3mg  q 2hours - add toradol - monitor on med surg bed - administer IVF's initially and reassess next am  2. h/o DVT/PE - continue home regimen coumadin - monitor INR daily. Last INR on 03/28/12 was therapeutic patient denies skipping any doses.  3. Leukocytosis - likely related to sickle cell pain crisis - No source of infection identified, no fevers, will therefore not start patient on antibiotics - continue to monitor  4. Hypokalemia - replace orally - recheck level next am.  5. Sickle cell anemia - last hgb at 8.2 which appears to be near his baseline - his reticulocytes are elevated - continue to monitor  Code Status: full Family Communication: none at bedside. Disposition Plan: Likely 1-3 days pending improvement in condition  Time spent: > 60 minutes  Penny Pia [facility] Pager [phone]  If 7PM-7AM, please contact  night-coverage [email or web address] Password [email or web address] 04/06/2012, 11:10 AM

## 2012-04-06 NOTE — ED Provider Notes (Signed)
History     CSN: [id number]  Arrival date & time 04/06/12  4098   First MD Initiated Contact with Patient 04/06/12 0421      Chief Complaint  Patient presents with  . Sickle Cell Pain Crisis    (Consider location/radiation/quality/duration/timing/severity/associated sxs/prior treatment) HPI History provided by pt.   Pt woke at 1am today w/ severe pain across low back as well as bilateral sides.  Pain is typical of sickle cell pain crises.  Denies fever and chest pain as well as N/V/D and urinary sx.  Took his po dilaudid w/out relief.  Denies recent trauma.  Per prior chart, pt admitted for sickle cell crises often, most recently 2/26.  Past Medical History  Diagnosis Date  . Sickle cell anemia   . Blood transfusion   . 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 Hypokalemia   . Mood disorder   . Pulmonary embolism   . Avascular necrosis   . Leukocytosis     Chronic  . Thrombocytosis     Chronic  . Hypertension     Past Surgical History  Procedure Laterality Date  . Right hip replacement      08/2006  . Cholecystectomy      01/2008  . Porta cath placement    . Porta cath removal    . Umbilical hernia repair      01/2008  . Excision of left periauricular cyst      10/2009  . Excision of right ear lobe cyst with primary closur      11/2007  . Portacath placement  01/05/2012    Procedure: INSERTION PORT-A-CATH;  Surgeon: Adolph Pollack, MD;  Location: [facility] OR;  Service: General;  Laterality: N/A;  ultrasound guiced port a cath insertion with fluoroscopy    Family History  Problem Relation Age of Onset  . Sickle cell anemia Mother   . Sickle cell anemia Father   . Sickle cell trait Brother     History  Substance Use Topics  . Smoking status: Former Smoker -- 13 years    Types: Cigarettes    Quit date: 07/08/2010  . Smokeless tobacco: Never Used  . Alcohol Use: No      Review of Systems  All other systems reviewed and are  negative.    Allergies  Morphine and related  Home Medications   Current Outpatient Rx  Name  Route  Sig  Dispense  Refill  . azithromycin (ZITHROMAX) 250 MG tablet      Take as Rx until completed   3 each   0   . folic acid (FOLVITE) 1 MG tablet   Oral   Take 1 tablet (1 mg total) by mouth daily.   100 tablet   2   . HYDROmorphone (DILAUDID) 4 MG tablet   Oral   Take 1-2 tablets (4-8 mg total) by mouth every 4 (four) hours as needed for pain. For pain.   60 tablet   0   . hydroxyurea (HYDREA) 500 MG capsule   Oral   Take 1 capsule (500 mg total) by mouth 2 (two) times daily. May take with food to minimize GI side effects.   60 capsule   2   . ibuprofen (ADVIL,MOTRIN) 800 MG tablet   Oral   Take 1 tablet (800 mg total) by mouth every 8 (eight) hours as needed for pain. Do not exceed use for more than 20/30 days   30 tablet  0   . lisinopril (PRINIVIL,ZESTRIL) 10 MG tablet   Oral   Take 1 tablet (10 mg total) by mouth daily at 10 pm.   30 tablet   2   . OLANZapine (ZYPREXA) 10 MG tablet   Oral   Take 10 mg by mouth at bedtime.         Marland Kitchen oxyCODONE (OXYCONTIN) 80 MG 12 hr tablet   Oral   Take 1 tablet (80 mg total) by mouth every 12 (twelve) hours.   30 tablet   0   . potassium chloride SA (K-DUR,KLOR-CON) 20 MEQ tablet   Oral   Take 1 tablet (20 mEq total) by mouth 2 (two) times daily.   60 tablet   1   . warfarin (COUMADIN) 5 MG tablet   Oral   Take 5-7.5 mg by mouth daily. Takes 5 mg daily except Monday Wednesday and Friday he takes 7.5 mg         . zolpidem (AMBIEN) 10 MG tablet   Oral   Take 1 tablet (10 mg total) by mouth at bedtime as needed. For sleep   30 tablet   2     BP 132/83  Pulse 78  Temp(Src) 98.8 F (37.1 C) (Oral)  Resp 22  SpO2 96%  Physical Exam  Nursing note and vitals reviewed. Constitutional: He is oriented to person, place, and time. He appears well-developed and well-nourished. No distress.  HENT:  Head:  Normocephalic and atraumatic.  Eyes:  Normal appearance  Neck: Normal range of motion.  Cardiovascular: Normal rate and regular rhythm.   Pulmonary/Chest: Effort normal and breath sounds normal. No respiratory distress.  Abdominal: Soft. Bowel sounds are normal. He exhibits no distension and no mass. There is no rebound and no guarding.  Mild tenderness bilateral sides  Genitourinary:  No CVA tenderness  Musculoskeletal: Normal range of motion.  Entire back non-tender  Neurological: He is alert and oriented to person, place, and time.  Skin: Skin is warm and dry. No rash noted.  Psychiatric: He has a normal mood and affect. His behavior is normal.    ED Course  Procedures (including critical care time)  Labs Reviewed  CBC WITH DIFFERENTIAL - Abnormal; Notable for the following:    WBC 23.9 (*)    RBC 2.55 (*)    Hemoglobin 8.2 (*)    HCT 28.2 (*)    MCV 110.6 (*)    MCHC 29.1 (*)    RDW 18.8 (*)    Platelets 514 (*)    Neutrophils Relative 80 (*)    Neutro Abs 19.0 (*)    Lymphocytes Relative 7 (*)    Monocytes Absolute 2.4 (*)    All other components within normal limits  RETICULOCYTES - Abnormal; Notable for the following:    Retic Ct Pct 13.9 (*)    RBC. 2.55 (*)    Retic Count, Manual 354.5 (*)    All other components within normal limits  COMPREHENSIVE METABOLIC PANEL   No results found.   No diagnosis found.    MDM  32yo M presents w/ typical sickle cell pain crisis w/ pain located in low back and sides.  Denies CP and Fever.  On exam, afebrile, non-toxic appearing but restless, entire back non-tender, mild tenderness bilateral sides.  Labs pending.  Pt to receive IV dilaudid, toradol and fluids.  Will reassess shortly. 5:07 AM   Laveda Norman, PA-C to resume care.  6:02 AM  Otilio Miu, PA-C 04/06/12 [phone]

## 2012-04-06 NOTE — ED Notes (Signed)
Pt c/o generalized torso from sickle cell pain crisis. Port left clavicle. Pt nauseated, emesis x1 3 hours prior. Pain 8/10 started from flanks moved to chest. Breath sounds clear bilaterally. BP 128/84 HR 100 RR 20 Pulse 100% RA at 0401.

## 2012-04-07 DIAGNOSIS — D5700 Hb-SS disease with crisis, unspecified: Secondary | ICD-10-CM

## 2012-04-07 DIAGNOSIS — E876 Hypokalemia: Secondary | ICD-10-CM

## 2012-04-07 LAB — BASIC METABOLIC PANEL
CO2: 20 mEq/L (ref 19–32)
Chloride: 107 mEq/L (ref 96–112)
Potassium: 3.5 mEq/L (ref 3.5–5.1)
Sodium: 137 mEq/L (ref 135–145)

## 2012-04-07 LAB — CBC
Platelets: 466 10*3/uL — ABNORMAL HIGH (ref 150–400)
RBC: 2.2 MIL/uL — ABNORMAL LOW (ref 4.22–5.81)
WBC: 21.7 10*3/uL — ABNORMAL HIGH (ref 4.0–10.5)

## 2012-04-07 LAB — PROTIME-INR
INR: 1.34 (ref 0.00–1.49)
Prothrombin Time: 16.3 seconds — ABNORMAL HIGH (ref 11.6–15.2)

## 2012-04-07 MED ORDER — WARFARIN SODIUM 10 MG PO TABS
10.0000 mg | ORAL_TABLET | Freq: Once | ORAL | Status: AC
  Administered 2012-04-07: 10 mg via ORAL
  Filled 2012-04-07: qty 1

## 2012-04-07 MED ORDER — DIPHENHYDRAMINE HCL 50 MG/ML IJ SOLN
25.0000 mg | INTRAMUSCULAR | Status: DC | PRN
  Administered 2012-04-07 – 2012-04-14 (×28): 25 mg via INTRAVENOUS
  Filled 2012-04-07 (×28): qty 1

## 2012-04-07 NOTE — Progress Notes (Signed)
Subjective: 32 year old gentleman admitted with sickle cell pain crisis. Patient still having pain rated a 7-8/10. He has also dropped his hemoglobin from 8.2-7.2 overnight. No shortness of breath no cough no nausea vomiting or diarrhea.  Objective: Vital signs in last 24 hours: Temp:  [98.1 F (36.7 C)-98.5 F (36.9 C)] 98.1 F (36.7 C) (03/08 1404) Pulse Rate:  [73-99] 99 (03/08 1404) Resp:  [16-18] 16 (03/08 1404) BP: (107-130)/(64-87) 130/87 mmHg (03/08 1404) SpO2:  [94 %-100 %] 99 % (03/08 1404) Weight:  [76.703 kg (169 lb 1.6 oz)] 76.703 kg (169 lb 1.6 oz) (03/08 0147) Weight change:  Last BM Date: 04/04/12  Intake/Output from previous day: 03/07 0701 - 03/08 0700 In: 480 [P.O.:480] Out: 375 [Urine:375] Intake/Output this shift: Total I/O In: 720 [P.O.:720] Out: -   General appearance: alert, cooperative and no distress Eyes: conjunctivae/corneas clear. PERRL, EOM's intact. Fundi benign. Resp: clear to auscultation bilaterally Cardio: regular rate and rhythm, S1, S2 normal, no murmur, click, rub or gallop GI: soft, non-tender; bowel sounds normal; no masses,  no organomegaly Skin: Skin color, texture, turgor normal. No rashes or lesions Neurologic: Grossly normal  Lab Results:  Recent Labs  04/06/12 0535 04/07/12 0543  WBC 23.9* 21.7*  HGB 8.2* 7.2*  HCT 28.2* 20.8*  PLT 514* 466*   BMET  Recent Labs  04/06/12 0535 04/07/12 0543  NA 135 137  K 3.0* 3.5  CL 102 107  CO2 22 20  GLUCOSE 105* 121*  BUN 5* 5*  CREATININE 0.48* 0.59  CALCIUM 8.8 8.4    Studies/Results: Dg Chest 2 View  04/06/2012  *RADIOLOGY REPORT*  Clinical Data: Chest pain.  Sickle cell crisis.  CHEST - 2 VIEW  Comparison: Radiographs 03/28/2012.  CT 03/01/2012.  Findings: The left subclavian power port is unchanged in the lower SVC.  Heart size and mediastinal contours are stable.  Chronic central airway thickening at both lung bases appears stable.  There is no superimposed airspace  disease, pleural effusion or pneumothorax.  IMPRESSION: Stable chronic lung disease.  No acute cardiopulmonary process identified.   Original Report Authenticated By: Carey Bullocks, M.D.     Medications: I have reviewed the patient's current medications.  Assessment/Plan: 32 year old gentleman with sickle cell pain crisis.  Plan #1 sickle cell pain crisis: Patient had an adequate coverage with IV Dilaudid 4 mg. Also on hydroxyurea and Toradol. We'll continue current medications and start his oral medicines in a day or 2.  Plan #2 hypokalemia: This has been repleted.  Plan #3 sickle cell anemia: Patient's hemoglobin seems stable on admission however it H&H has dropped. We will follow H&H closely and if needed will transfuse the patient.  Plan #4 leukocytosis: This seems to be improving gradually.  Plan #5 disposition: Continue treatment until ready for discharge.  LOS: 1 day   [name],[name] 04/07/2012, 2:09 PM

## 2012-04-08 DIAGNOSIS — D72829 Elevated white blood cell count, unspecified: Secondary | ICD-10-CM | POA: Diagnosis not present

## 2012-04-08 DIAGNOSIS — I10 Essential (primary) hypertension: Secondary | ICD-10-CM

## 2012-04-08 DIAGNOSIS — I2782 Chronic pulmonary embolism: Secondary | ICD-10-CM | POA: Diagnosis not present

## 2012-04-08 LAB — BASIC METABOLIC PANEL
Chloride: 106 mEq/L (ref 96–112)
GFR calc Af Amer: >90 mL/min (ref >90)
Potassium: 3.8 mEq/L (ref 3.5–5.1)
Sodium: 136 mEq/L (ref 135–145)

## 2012-04-08 LAB — CBC
Platelets: 467 10*3/uL — ABNORMAL HIGH (ref 150–400)
RDW: 19.4 % — ABNORMAL HIGH (ref 11.5–15.5)
WBC: 24.6 10*3/uL — ABNORMAL HIGH (ref 4.0–10.5)

## 2012-04-08 LAB — PROTIME-INR: INR: 1.75 — ABNORMAL HIGH (ref 0.00–1.49)

## 2012-04-08 MED ORDER — HYDROMORPHONE HCL PF 2 MG/ML IJ SOLN
2.0000 mg | INTRAMUSCULAR | Status: DC
  Administered 2012-04-08: 2 mg via INTRAVENOUS
  Administered 2012-04-08: 4 mg via INTRAVENOUS
  Administered 2012-04-08: 2 mg via INTRAVENOUS
  Administered 2012-04-08 – 2012-04-09 (×3): 4 mg via INTRAVENOUS
  Administered 2012-04-09 (×2): 2 mg via INTRAVENOUS
  Administered 2012-04-09 (×6): 4 mg via INTRAVENOUS
  Filled 2012-04-08 (×7): qty 2
  Filled 2012-04-08: qty 1
  Filled 2012-04-08 (×6): qty 2

## 2012-04-08 MED ORDER — WARFARIN SODIUM 7.5 MG PO TABS
7.5000 mg | ORAL_TABLET | Freq: Once | ORAL | Status: AC
  Administered 2012-04-08: 7.5 mg via ORAL
  Filled 2012-04-08: qty 1

## 2012-04-08 NOTE — Progress Notes (Signed)
ANTICOAGULATION CONSULT NOTE - Follow up Consult  Pharmacy Consult for Warfarin Indication:  History of PE/DVT  Allergies  Allergen Reactions  . Morphine And Related Hives and Rash    Pt states he is able to tolerate Dilaudid with no reactions.    Patient Measurements: Height: 6' (182.9 cm) Weight: 172 lb 12.8 oz (78.382 kg) (standing scale) IBW/kg (Calculated) : 77.6  Vital Signs: Temp: 98.5 F (36.9 C) (03/09 1048) Temp src: Oral (03/09 1048) BP: 133/90 mmHg (03/09 1048) Pulse Rate: 111 (03/09 1048)  Labs:  Recent Labs  04/06/12 0535 04/06/12 1500 04/07/12 0543 04/08/12 0605  HGB 8.2*  --  7.2* 7.0*  HCT 28.2*  --  20.8* 20.3*  PLT 514*  --  466* 467*  LABPROT  --  16.4* 16.3* 19.8*  INR  --  1.35 1.34 1.75*  CREATININE 0.48*  --  0.59 0.58    Estimated Creatinine Clearance: 145.5 ml/min (by C-G formula based on Cr of 0.58).   Medical History: Past Medical History  Diagnosis Date  . Sickle cell anemia   . Blood transfusion   . 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 Hypokalemia   . Mood disorder   . Pulmonary embolism   . Avascular necrosis   . Leukocytosis     Chronic  . Thrombocytosis     Chronic  . Hypertension     Assessment: 14 yoM with sickle cell admitted for pain in ribs and back.  Pt on chronic coumadin PTA for h/o PE/DVT.  Pharmacy asked to resume coumadin.    INR on admission is subtherapeutic @ 1.35.  PTA dose: 5mg  daily except 7.5mg  MWF  INR 1.75, remains subtherapeutic but increasing after boosted doses x2 days.  CBC:  Plt ok, Hgb low, no bleeding reported  Goal of Therapy:  INR 2-3 Monitor platelets by anticoagulation protocol: Yes   Plan:  Warfarin 7.5 mg PO tonight Daily INR.  Lynann Beaver PharmD, BCPS Pager [phone] 04/08/2012 11:12 AM

## 2012-04-08 NOTE — Progress Notes (Signed)
ANTICOAGULATION CONSULT NOTE - Follow up Consult  Pharmacy Consult for Warfarin Indication:  History of PE/DVT  Allergies  Allergen Reactions  . Morphine And Related Hives and Rash    Pt states he is able to tolerate Dilaudid with no reactions.    Patient Measurements: Height: 6' (182.9 cm) Weight: 172 lb 12.8 oz (78.382 kg) (standing scale) IBW/kg (Calculated) : 77.6  Vital Signs: Temp: 98.5 F (36.9 C) (03/09 1048) Temp src: Oral (03/09 1048) BP: 133/90 mmHg (03/09 1048) Pulse Rate: 111 (03/09 1048)  Labs:  Recent Labs  04/06/12 0535 04/06/12 1500 04/07/12 0543 04/08/12 0605  HGB 8.2*  --  7.2* 7.0*  HCT 28.2*  --  20.8* 20.3*  PLT 514*  --  466* 467*  LABPROT  --  16.4* 16.3* 19.8*  INR  --  1.35 1.34 1.75*  CREATININE 0.48*  --  0.59 0.58    Estimated Creatinine Clearance: 145.5 ml/min (by C-G formula based on Cr of 0.58).   Medical History: Past Medical History  Diagnosis Date  . Sickle cell anemia   . Blood transfusion   . 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 Hypokalemia   . Mood disorder   . Pulmonary embolism   . Avascular necrosis   . Leukocytosis     Chronic  . Thrombocytosis     Chronic  . Hypertension     Assessment: 1 yoM with sickle cell admitted for pain in ribs and back.  Pt on chronic coumadin PTA for h/o PE/DVT.  Pharmacy asked to resume coumadin.    INR on admission is subtherapeutic @ 1.35.  PTA dose: 5mg  daily except 7.5mg  MWF  INR 1.34, no change despite boosted dose last night  CBC:  Plt ok, Hgb dec 8.2 --> 7.2, no bleeding reported  Goal of Therapy:  INR 2-3 Monitor platelets by anticoagulation protocol: Yes   Plan:  Warfarin 10mg  PO tonight Daily INR.  Lynann Beaver PharmD, BCPS Pager [phone]

## 2012-04-08 NOTE — Progress Notes (Signed)
Subjective: Patient states that his pain is still 8/10. He continues to get poor milligram of Dilaudid every 2 hours. Pain is mostly in his lower back and in his ribs. This is her usual pain for sickle cell flare.   Objective: Vital signs in last 24 hours: Temp:  [98.1 F (36.7 C)-99.3 F (37.4 C)] 98.5 F (36.9 C) (03/09 1048) Pulse Rate:  [93-111] 111 (03/09 1048) Resp:  [16-24] 24 (03/09 1048) BP: (121-133)/(70-90) 133/90 mmHg (03/09 1048) SpO2:  [95 %-99 %] 99 % (03/09 1048) Weight:  [78.382 kg (172 lb 12.8 oz)] 78.382 kg (172 lb 12.8 oz) (03/09 0059) Weight change: 2.087 kg (4 lb 9.6 oz) Last BM Date: 04/07/12  Intake/Output from previous day: 03/08 0701 - 03/09 0700 In: 1560 [P.O.:960; I.V.:600] Out: -  Intake/Output this shift:    General appearance: alert, cooperative and no distress Eyes: conjunctivae/corneas clear. PERRL, EOM's intact. Fundi benign. Resp: clear to auscultation bilaterally Cardio: regular rate and rhythm, S1, S2 normal, no murmur, click, rub or gallop GI: soft, non-tender; bowel sounds normal; no masses,  no organomegaly Skin: Skin color, texture, turgor normal. No rashes or lesions Neurologic: Grossly normal  Lab Results:  Recent Labs  04/07/12 0543 04/08/12 0605  WBC 21.7* 24.6*  HGB 7.2* 7.0*  HCT 20.8* 20.3*  PLT 466* 467*   BMET  Recent Labs  04/07/12 0543 04/08/12 0605  NA 137 136  K 3.5 3.8  CL 107 106  CO2 20 21  GLUCOSE 121* 112*  BUN 5* 7  CREATININE 0.59 0.58  CALCIUM 8.4 8.5     Medications: I have reviewed the patient's current medications.  Assessment/Plan: 32 year old gentleman with sickle cell pain crisis.  Plan #1 sickle cell pain crisis: Patient continues to complain of 8/10 pain.  Changed Dilaudid to 2-4 mg IV every 2 hr. Also on hydroxyurea and Toradol. We'll continue current medications and start his oral medicines in a day or 2.  Plan #2 Hypokalemia: This has been repleted.  Plan #3 Sickle cell  anemia: Patient's hemoglobin seems stable on admission however it H&H has dropped. Will transfuse if hemoglobin is less than 7.   Plan #4 leukocytosis: Will monitor her for now. No sign of infection.  Most likely secondary to vaso-occlusive crisis.  Plan #5 disposition: Patient is not yet ready for discharge..  LOS: 2 days   Carollee Massed Pager [phone] 04/08/2012, 12:43 PM

## 2012-04-09 DIAGNOSIS — I2699 Other pulmonary embolism without acute cor pulmonale: Secondary | ICD-10-CM | POA: Diagnosis not present

## 2012-04-09 DIAGNOSIS — I82C29 Chronic embolism and thrombosis of unspecified internal jugular vein: Secondary | ICD-10-CM | POA: Diagnosis not present

## 2012-04-09 DIAGNOSIS — I10 Essential (primary) hypertension: Secondary | ICD-10-CM | POA: Diagnosis not present

## 2012-04-09 DIAGNOSIS — E876 Hypokalemia: Secondary | ICD-10-CM | POA: Diagnosis not present

## 2012-04-09 DIAGNOSIS — I2782 Chronic pulmonary embolism: Secondary | ICD-10-CM | POA: Diagnosis not present

## 2012-04-09 DIAGNOSIS — D72829 Elevated white blood cell count, unspecified: Secondary | ICD-10-CM | POA: Diagnosis not present

## 2012-04-09 LAB — CBC
MCH: 31.7 pg (ref 26.0–34.0)
Platelets: 455 10*3/uL — ABNORMAL HIGH (ref 150–400)
RBC: 2.02 MIL/uL — ABNORMAL LOW (ref 4.22–5.81)
WBC: 22.1 10*3/uL — ABNORMAL HIGH (ref 4.0–10.5)

## 2012-04-09 LAB — PROTIME-INR
INR: 1.89 — ABNORMAL HIGH (ref 0.00–1.49)
Prothrombin Time: 21 seconds — ABNORMAL HIGH (ref 11.6–15.2)

## 2012-04-09 LAB — COMPREHENSIVE METABOLIC PANEL
ALT: 21 U/L (ref 0–53)
AST: 39 U/L — ABNORMAL HIGH (ref 0–37)
CO2: 21 mEq/L (ref 19–32)
Calcium: 8.4 mg/dL (ref 8.4–10.5)
Chloride: 109 mEq/L (ref 96–112)
GFR calc non Af Amer: >90 mL/min (ref >90)
Sodium: 138 mEq/L (ref 135–145)

## 2012-04-09 LAB — PREPARE RBC (CROSSMATCH)

## 2012-04-09 MED ORDER — SODIUM CHLORIDE 0.45 % IV SOLN
INTRAVENOUS | Status: DC
  Administered 2012-04-10 – 2012-04-13 (×6): via INTRAVENOUS

## 2012-04-09 MED ORDER — DEXTROSE 5 % IV SOLN
2000.0000 mg | Freq: Every day | INTRAVENOUS | Status: DC
  Administered 2012-04-09 – 2012-04-14 (×5): 2000 mg via INTRAVENOUS
  Filled 2012-04-09 (×6): qty 2

## 2012-04-09 MED ORDER — WARFARIN SODIUM 10 MG PO TABS
10.0000 mg | ORAL_TABLET | Freq: Once | ORAL | Status: AC
  Administered 2012-04-09: 10 mg via ORAL
  Filled 2012-04-09: qty 1

## 2012-04-09 MED ORDER — HYDROMORPHONE HCL PF 2 MG/ML IJ SOLN
2.0000 mg | INTRAMUSCULAR | Status: DC
  Administered 2012-04-09: 3 mg via INTRAVENOUS
  Administered 2012-04-09 – 2012-04-10 (×4): 2 mg via INTRAVENOUS
  Administered 2012-04-10 (×2): 3 mg via INTRAVENOUS
  Administered 2012-04-10: 2 mg via INTRAVENOUS
  Filled 2012-04-09: qty 2
  Filled 2012-04-09: qty 1
  Filled 2012-04-09: qty 2
  Filled 2012-04-09 (×4): qty 1
  Filled 2012-04-09: qty 2
  Filled 2012-04-09: qty 1

## 2012-04-09 NOTE — Progress Notes (Signed)
CRITICAL VALUE ALERT  Critical value received:Hgb 6.4   Date of notification:  04/09/12   Time of notification:  0600  Critical value read back:yes  Nurse who received alert:  B Holzimmer RN   MD notified (1st page):  Benedetto Coons  Time of first ZOXW:[id number] MD notified (2nd page):  Time of second page:  Responding MD:  Benedetto Coons  Time MD responded:  0630

## 2012-04-09 NOTE — Progress Notes (Signed)
ANTICOAGULATION CONSULT NOTE - Follow up Consult  Pharmacy Consult for Warfarin Indication:  History of PE/DVT  Allergies  Allergen Reactions  . Morphine And Related Hives and Rash    Pt states he is able to tolerate Dilaudid with no reactions.   Patient Measurements: Height: 6' (182.9 cm) Weight: 175 lb 9.6 oz (79.652 kg) IBW/kg (Calculated) : 77.6  Vital Signs: Temp: 98.4 F (36.9 C) (03/10 0451) Temp src: Oral (03/10 0451) BP: 142/89 mmHg (03/10 0451) Pulse Rate: 101 (03/10 0451)  Labs:  Recent Labs  04/07/12 0543 04/08/12 0605 04/09/12 0500  HGB 7.2* 7.0* 6.4*  HCT 20.8* 20.3* 18.7*  PLT 466* 467* 455*  LABPROT 16.3* 19.8* 21.0*  INR 1.34 1.75* 1.89*  CREATININE 0.59 0.58 0.55   Estimated Creatinine Clearance: 145.5 ml/min (by C-G formula based on Cr of 0.55).  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  Pt on chronic coumadin PTA for h/o PE/DVT.  Pharmacy asked to resume coumadin.    INR on admission is subtherapeutic @ 1.35.  PTA dose: 5mg  daily except 7.5mg  MWF  INR = 1.89, Warfarin doses from 3/7 = 10,10,7.5mg   Hgb 6.4, possible transfusion  Concurrent Toradol x 5 days  Monitor CBC  Goal of Therapy:  INR 2-3   Plan:  Warfarin 10mg  today  Daily INR.  Otho Bellows PharmD Pager [phone] 04/09/2012, 10:09 AM

## 2012-04-09 NOTE — Discharge Summary (Signed)
Please Note that this discharge summary was completed on 03/02/2012 however was inaccurately filed under a progress note. The discharge summary is being copied images from the progress note onto to the correct heading of discharge summary.  [name] MRN: [id number] DOB/AGE: 1981-01-17 31 y.o.  Admit date: 03/01/2012 Discharge date: 04/09/2012  Primary Care Physician:  Willey Blade, MD   Discharge Diagnoses:   DOE/SOB with Hypoxemia: Sickle Cell Anemia with Crisis:  HTN   DISCHARGE MEDICATION:   Medication List    TAKE these medications       folic acid 1 MG tablet  Commonly known as:  FOLVITE     HYDROmorphone 4 MG tablet  Commonly known as:  DILAUDID     oxyCODONE 80 MG 12 hr tablet  Commonly known as:  OXYCONTIN     hydroxyurea 500 MG capsule  Commonly known as:  HYDREA  Take 1 capsule (500 mg total) by mouth 2 (two) times daily. May take with food to minimize GI side effects.     ibuprofen 800 MG tablet  Commonly known as:  ADVIL,MOTRIN  Take 1 tablet (800 mg total) by mouth every 8 (eight) hours as needed for pain. Do not exceed use for more than 20/30 days     lisinopril 10 MG tablet  Commonly known as:  PRINIVIL,ZESTRIL  Take 1 tablet (10 mg total) by mouth daily at 10 pm.     OLANZapine 10 MG tablet  Commonly known as:  ZYPREXA  Take 10 mg by mouth at bedtime.     zolpidem 10 MG tablet  Commonly known as:  AMBIEN  Take 1 tablet (10 mg total) by mouth at bedtime as needed. For sleep          Consults:  one   SIGNIFICANT DIAGNOSTIC STUDIES:  Dg Chest 2 View  04/06/2012  *RADIOLOGY REPORT*  Clinical Data:   Sickle cell crisis.  CHEST - 2 VIEW  Comparison: Radiographs 01/14/2012. Findings: Port-A-Cath tip is at the superior vena cava - right atrium junction. No pneumothorax   IMPRESSION: There is evidence of mild cardiac enlargement with mild pulmonary  venous hypertension. A degree of volume overload/high output state  is suspected, consistent  with known sickle cell disease. There is  no frank edema or consolidation. Mild lung scarring is stable Original Report Authenticated By: Bretta Bang, M.D.  Dg Chest 2 View  03/01/2012  *RADIOLOGY REPORT*  Clinical Data: Dyspnea on exertion  CHEST - 2 VIEW  Comparison: 03/17/2012  Findings: Left chest wall porta-catheter is noted with tip in the projection of the cavoatrial junction.  There is mild cardiac enlargement.  There is no pleural effusion or edema.  No airspace consolidation noted.  IMPRESSION:  1.  No acute cardiopulmonary abnormalities.   Original Report Authenticated By: Signa Kell, M.D.        No results found for this or any previous visit (from the past 240 hour(s)).  BRIEF ADMITTING H & P: [name] is a 32 year old African American male with SS genotype Sickle cell disease. He called the [facility] today with reports abdominal pain but on arrival reports progressively increased SOB/DOE x 2 days and complaints pain bilateral ribs, and low back not relieved with his home medication regimen. He describes this pain as constant sharp and rated 8/10 prior to and 7/10 following his first dose of IV Dilaudid. Of note, he reports recent crisis pain admission at [facility] 2-3 weeks ago. He was discharged to continue  his previous home medications with no new Rx given. He was also scheduled regular office visit with Dr. [name] on Monday 02/27/12 but reports not being aware he had an appointment. He reports having a Hgb 6.6 at the time he was discharged from [facility] and denies any ED evaluations since then. He currently denies fever, chills, nausea, vomiting, abdominal pain, LUTS, priapism, or headache, but reports some dizziness when asked to sit up and take a deep breath. This resolved spontaneously with sitting still and was not associated with increased SOB or chest pain. His appetite is fair and last BM yesterday evening.     Hospital Course:  Present on Admission:  . DOE/SOB with  Hypoxemia: Upon presentation the patient complained SOB/DOE. Patient also had complaints of dizziness and was noted to have a marginal blood pressure. This was discussed with the patient and request was made for further evaluation other laboratory pulse ox. The patient refused and felt that his symptoms were improving after hydration and with the management of his pain. Of note his symptoms did improve after hydration. However the next morning I challenged the patient to have an ambulatory pulse ox which revealed hypoxia. Concern for pulmonary embolus was high and the index of suspicion the patient was discharged to the emergency room for further evaluation and management.  . Sickle cell anemia with crisis:  The patient [facility] for management of acute pain associated with vaso-occlusive crisis. The patient responded well to treatment and his pain essentially resolving. However due to concern for pulmonary embolus the patient was triaged to the emergency room for further evaluation.   Disposition and Follow-up:  Pt being discharged home with continued complaints of shortness of breath and now documented hypoxemia to be evaluated in the emergency room. His condition at the time of discharge is stable     Discharge Orders   Future Orders Complete By Expires     Activity as tolerated - No restrictions  As directed     Diet general  As directed        DISCHARGE EXAM:  Vital Signs: Orthostatic Vital Signs: Lying- temp 98.1 BP 107/61 HR 58 RR 18 pulsox 98%  Sitting- BP 107/72 HR 64 HR 64 RR 18 pulsox 100%  Standing- BP 97/65 HR 73 RR 18 pulsox 98%  pulsox sustained 95%, pulsox 85% after ambulating 50 feet. When returning to standing position, pulsox returned to sustained 98  Temperature: 98.  General Appearance: Alert, Ox3, pleasant and cooperative  Head: Normocephalic, atraumatic  Eyes: PERRL, slight strabismus, mild-mod icteric sclera  Back: Symmetric, no CVA  tenderness  Resp: even and nonlabored, diminished bilateral bases, no vocal fremitus; no wheezes/rales/rhonchi  Cardio: S1, S2 regular, no murmurs/clicks/rubs/gallops porta cath upper right chest  GI: Soft,, Non distended , + bowel sounds, no masses or organomegaly appreciated, bilateral flank pain guarding during exam  Male Genitalia: Deferred; denies priapism  Extremities: Extremities normal, atraumatic, no cyanosis or edema, negative Homans  Pulses: 2+ and symmetric  Skin: Skin intact numerous tatoos  Neurologic: Grossly intact, no focal deficits  Psych: Appropriate affect      Recent Labs  04/08/12 0605 04/09/12 0500  NA 136 138  K 3.8 3.9  CL 106 109  CO2 21 21  GLUCOSE 112* 113*  BUN 7 6  CREATININE 0.58 0.55  CALCIUM 8.5 8.4    Recent Labs  04/09/12 0500  AST 39*  ALT 21  ALKPHOS 86  BILITOT 4.6*  PROT 7.0  ALBUMIN 3.6   No results found for this basename: LIPASE, AMYLASE,  in the last 72 hours  Recent Labs  04/08/12 0605 04/09/12 0500  WBC 24.6* 22.1*  HGB 7.0* 6.4*  HCT 20.3* 18.7*  MCV 93.1 92.6  PLT 467* 455*   Total time for discharge including decision-making face-to-face time greater than 30 minutes  Signed: [name],[name] 04/09/2012, 10:43 AM

## 2012-04-09 NOTE — Progress Notes (Signed)
Subjective: Patient states that his pain is 6/10. He continues to get 4 milligram of Dilaudid every 2 hours. Pain is mostly in his lower back and in his ribs. He is much more comfortable compared to yesterday.   Objective: Vital signs in last 24 hours: Temp:  [98.1 F (36.7 C)-99.3 F (37.4 C)] 98.5 F (36.9 C) (03/09 1048) Pulse Rate:  [93-111] 111 (03/09 1048) Resp:  [16-24] 24 (03/09 1048) BP: (121-133)/(70-90) 133/90 mmHg (03/09 1048) SpO2:  [95 %-99 %] 99 % (03/09 1048) Weight:  [78.382 kg (172 lb 12.8 oz)] 78.382 kg (172 lb 12.8 oz) (03/09 0059) Weight change: 2.087 kg (4 lb 9.6 oz) Last BM Date: 04/07/12  Intake/Output from previous day: 03/08 0701 - 03/09 0700 In: 1560 [P.O.:960; I.V.:600] Out: -  Intake/Output this shift: Total I/O In: 1022.5 [P.O.:360; I.V.:650; Blood:12.5] Out: -   General appearance: alert, cooperative and no distress Eyes: conjunctivae/corneas clear. PERRL, EOM's intact Resp: clear to auscultation bilaterally Cardio: regular rate and rhythm, S1, S2 normal, no murmur, rub or gallop GI: soft, non-tender; bowel sounds normal; no masses,  no organomegaly Skin: Skin color, texture, turgor normal. No rashes or lesions Neurologic: Grossly normal  Lab Results:  Recent Labs  04/07/12 0543 04/08/12 0605  WBC 21.7* 24.6*  HGB 7.2* 7.0*  HCT 20.8* 20.3*  PLT 466* 467*   BMET  Recent Labs  04/07/12 0543 04/08/12 0605  NA 137 136  K 3.5 3.8  CL 107 106  CO2 20 21  GLUCOSE 121* 112*  BUN 5* 7  CREATININE 0.59 0.58  CALCIUM 8.4 8.5     Medications: I have reviewed the patient's current medications.  Assessment/Plan: 32 year old gentleman with sickle cell pain crisis.   #1 Sickle cell pain crisis: Patient states that his pain is 6/10 pain.  Changed Dilaudid to 2-3 mg IV every 2 hr PRN. Per nurse patient is still asking for his Dilaudid every 2 hours. Continue hydroxyurea and Toradol and MS Contin.    #2 Hypokalemia: This has been  repleted.   #3 Sickle cell anemia: Patient's hemoglobin trended down to 6.4. Will transfuse him 1 unit today.   #4 leukocytosis: Will monitor  for now. No sign of infection.  Most likely secondary to vaso-occlusive crisis. 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  INR subtherapeutic. Coumadin per pharmacy.    Plan # Disposition: Patient is not yet ready for discharge today.  LOS: 2 days   Carollee Massed Pager [phone] 04/08/2012, 12:43 PM

## 2012-04-09 NOTE — Discharge Summary (Signed)
Patient seen and examined and evaluated. Assessment and plan discussed with nurse practitioner Vilinda Blanks. With assessment and plan.

## 2012-04-10 DIAGNOSIS — D72829 Elevated white blood cell count, unspecified: Secondary | ICD-10-CM | POA: Diagnosis not present

## 2012-04-10 DIAGNOSIS — E876 Hypokalemia: Secondary | ICD-10-CM | POA: Diagnosis not present

## 2012-04-10 DIAGNOSIS — D571 Sickle-cell disease without crisis: Secondary | ICD-10-CM | POA: Diagnosis not present

## 2012-04-10 DIAGNOSIS — D5700 Hb-SS disease with crisis, unspecified: Secondary | ICD-10-CM | POA: Diagnosis not present

## 2012-04-10 DIAGNOSIS — M549 Dorsalgia, unspecified: Secondary | ICD-10-CM | POA: Diagnosis not present

## 2012-04-10 DIAGNOSIS — I2782 Chronic pulmonary embolism: Secondary | ICD-10-CM | POA: Diagnosis not present

## 2012-04-10 LAB — CBC
MCH: 31.5 pg (ref 26.0–34.0)
MCHC: 35.4 g/dL (ref 30.0–36.0)
MCV: 89.2 fL (ref 78.0–100.0)
Platelets: 408 10*3/uL — ABNORMAL HIGH (ref 150–400)
RDW: 22.3 % — ABNORMAL HIGH (ref 11.5–15.5)
WBC: 18.3 10*3/uL — ABNORMAL HIGH (ref 4.0–10.5)

## 2012-04-10 MED ORDER — SODIUM CHLORIDE 0.9 % IJ SOLN: 9.0000 mL | INTRAMUSCULAR | Status: DC | PRN

## 2012-04-10 MED ORDER — WARFARIN SODIUM 5 MG PO TABS
5.0000 mg | ORAL_TABLET | Freq: Once | ORAL | Status: AC
  Administered 2012-04-10: 5 mg via ORAL
  Filled 2012-04-10: qty 1

## 2012-04-10 MED ORDER — NALOXONE HCL 0.4 MG/ML IJ SOLN: 0.4000 mg | INTRAMUSCULAR | Status: DC | PRN

## 2012-04-10 MED ORDER — HYDROMORPHONE HCL PF 2 MG/ML IJ SOLN
4.0000 mg | INTRAMUSCULAR | Status: DC
  Administered 2012-04-10 – 2012-04-14 (×48): 4 mg via INTRAVENOUS
  Filled 2012-04-10 (×48): qty 2

## 2012-04-10 MED ORDER — HYDROMORPHONE 0.3 MG/ML IV SOLN
INTRAVENOUS | Status: DC
  Administered 2012-04-10: 2.9 mg via INTRAVENOUS
  Administered 2012-04-10: 4.8 mg via INTRAVENOUS
  Administered 2012-04-10 (×2): via INTRAVENOUS
  Administered 2012-04-10: 5.4 mg via INTRAVENOUS
  Administered 2012-04-11: 11:00:00 via INTRAVENOUS
  Administered 2012-04-11: 3.59 mg via INTRAVENOUS
  Administered 2012-04-11: 3.19 mg via INTRAVENOUS
  Administered 2012-04-11: 4.2 mg via INTRAVENOUS
  Administered 2012-04-11: 4.37 mg via INTRAVENOUS
  Administered 2012-04-11: 18:00:00 via INTRAVENOUS
  Administered 2012-04-11: 4.59 mg via INTRAVENOUS
  Administered 2012-04-12: 2.59 mg via INTRAVENOUS
  Administered 2012-04-12: 7.39 mg via INTRAVENOUS
  Administered 2012-04-12: 4.79 mg via INTRAVENOUS
  Administered 2012-04-12: 3.55 mg via INTRAVENOUS
  Administered 2012-04-12: 2.19 mg via INTRAVENOUS
  Administered 2012-04-12 (×2): via INTRAVENOUS
  Administered 2012-04-12: 1.56 mg via INTRAVENOUS
  Administered 2012-04-12: 19:00:00 via INTRAVENOUS
  Administered 2012-04-13: 1.39 mg via INTRAVENOUS
  Administered 2012-04-13: 3.65 mg via INTRAVENOUS
  Administered 2012-04-13: 3.19 mg via INTRAVENOUS
  Administered 2012-04-13: 10:00:00 via INTRAVENOUS
  Administered 2012-04-13: 0.739 mg via INTRAVENOUS
  Administered 2012-04-13: 0.4 mg via INTRAVENOUS
  Administered 2012-04-13: 2.19 mg via INTRAVENOUS
  Filled 2012-04-10 (×9): qty 25

## 2012-04-10 MED ORDER — ONDANSETRON HCL 4 MG/2ML IJ SOLN: 4.0000 mg | Freq: Four times a day (QID) | INTRAMUSCULAR | Status: DC | PRN

## 2012-04-10 NOTE — Progress Notes (Signed)
ANTICOAGULATION CONSULT NOTE - Follow up Consult  Pharmacy Consult for Warfarin Indication:  History of PE/DVT  Allergies  Allergen Reactions  . Morphine And Related Hives and Rash    Pt states he is able to tolerate Dilaudid with no reactions.   Patient Measurements: Height: 6' (182.9 cm) Weight: 176 lb 4.8 oz (79.969 kg) IBW/kg (Calculated) : 77.6  Vital Signs: Temp: 97.9 F (36.6 C) (03/11 0613) Temp src: Oral (03/11 0613) BP: 136/85 mmHg (03/11 0613) Pulse Rate: 91 (03/11 0613)  Labs:  Recent Labs  04/08/12 0605 04/09/12 0500 04/10/12 0630  HGB 7.0* 6.4* 7.0*  HCT 20.3* 18.7* 19.8*  PLT 467* 455* 408*  LABPROT 19.8* 21.0* 26.5*  INR 1.75* 1.89* 2.59*  CREATININE 0.58 0.55  --    Estimated Creatinine Clearance: 145.5 ml/min (by C-G formula based on Cr of 0.55).  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  Pt on chronic coumadin PTA for h/o PE/DVT.  Pharmacy asked to resume coumadin.    INR on admission was subtherapeutic @ 1.35.  PTA dose: 5mg  daily except 7.5mg  MWF  INR = 2.59, Warfarin doses from 3/7 = 10, 10, 7.5, 10mg   Hgb 7.0, one unit PRBC  Concurrent Toradol x 5 days  Monitor CBC  Goal of Therapy:  INR 2-3   Plan:  Warfarin 5mg  today  Daily INR.  Otho Bellows PharmD Pager [phone] 04/10/2012, 8:41 AM

## 2012-04-10 NOTE — ED Provider Notes (Signed)
Medical screening examination/treatment/procedure(s) were performed by non-physician practitioner and as supervising physician I was immediately available for consultation/collaboration.  Jones Skene, M.D.     Jones Skene, MD 04/10/12 [phone]
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
## 2012-04-10 NOTE — ED Provider Notes (Signed)
Medical screening examination/treatment/procedure(s) were performed by non-physician practitioner and as supervising physician I was immediately available for consultation/collaboration.  Jones Skene, M.D.     Jones Skene, MD 04/10/12 [phone]

## 2012-04-11 DIAGNOSIS — D5700 Hb-SS disease with crisis, unspecified: Secondary | ICD-10-CM | POA: Diagnosis not present

## 2012-04-11 DIAGNOSIS — D72829 Elevated white blood cell count, unspecified: Secondary | ICD-10-CM | POA: Diagnosis not present

## 2012-04-11 DIAGNOSIS — I2782 Chronic pulmonary embolism: Secondary | ICD-10-CM | POA: Diagnosis not present

## 2012-04-11 MED ORDER — WARFARIN SODIUM 7.5 MG PO TABS
7.5000 mg | ORAL_TABLET | Freq: Once | ORAL | Status: AC
  Administered 2012-04-11: 7.5 mg via ORAL
  Filled 2012-04-11: qty 1

## 2012-04-11 MED ORDER — KETOROLAC TROMETHAMINE 30 MG/ML IJ SOLN: 30.0000 mg | Freq: Four times a day (QID) | INTRAMUSCULAR | Status: DC

## 2012-04-11 MED ORDER — KETOROLAC TROMETHAMINE 30 MG/ML IJ SOLN
30.0000 mg | Freq: Four times a day (QID) | INTRAMUSCULAR | Status: AC
  Administered 2012-04-11 – 2012-04-12 (×3): 30 mg via INTRAVENOUS
  Filled 2012-04-11 (×3): qty 1

## 2012-04-11 NOTE — Progress Notes (Signed)
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 He feels his pain is improving 7/10.  In a 24 hr period his  PCA usage was 20.8 mg.  Continue current pain regiment and reassess and revaluate tomorrow.    Leukocytosis: This is secondary to bone marrow turn over and inflammatory process component. There is no evidence of infection. Toradol 30mg  Q 6 hrs is extended for an additional 24 hrs for this inflammatory process. Renal function wnl. Continue to monitor renal function. This is a adjunct to his pain medication and feels it  provides better relief.  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 Monitor. 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 Ex-jade will be resumed after discharge.   Constipation:  secondary to narcotic use on senokot BM yesterday refused today dose  Mood disorder:  Mood remains appropiate continue Zyprexa.   Code Status: Full Code  Family Communication: None  Disposition Plan: Home when stable   [name], [name]    04/11/2012, 3:29 PM  LOS: 5 days   Brief narrative: [name], legs and his right shoulder. He states that although the pain in other areas have resolved his pain in those regions have remained approximately 10 out of 10 since admission with very little release. He states that the pain comes down as low as 9/10 but gets back to 10 out of 10 within a matter of one hour. Patient states last bowel movement was last night.   Consultants:  None   HPI/Subjective:   Objective: Filed Vitals:   04/11/12  0811 04/11/12 0949 04/11/12 1159 04/11/12 1357  BP:  137/77  122/76  Pulse:  101  82  Temp:  98.3 F (36.8 C)  98.7 F (37.1 C)  TempSrc:  Oral  Oral  Resp: 16 18 19 18   Height:      Weight:      SpO2: 96% 98% 95% 95%   Weight change: 0.499 kg (1 lb 1.6 oz)  Intake/Output Summary (Last 24 hours) at 04/11/12 1529 Last data filed at 04/11/12 1401  Gross per 24 hour  Intake      0 ml  Output   2100 ml  Net  -2100 ml    General: Alert, awake, oriented x3, in no acute distress.  HEENT: [place]/AT PEERL, EOMI Neck: Trachea midline,  no masses, no thyromegal,y no JVD, no carotid bruit OROPHARYNX:  Moist, No exudate/ erythema/lesions.  Heart: Regular rate and rhythm, without murmurs, rubs, gallops, PMI non-displaced, no heaves or thrills on palpation.  Lungs: Clear to auscultation, no wheezing or rhonchi noted. No increased vocal fremitus resonant to percussion  Abdomen: Soft, nontender, nondistended, positive bowel sounds Neuro: No focal neurological deficits noted cranial nerves II through XII grossly intact. Musculoskeletal: No warm swelling or erythema around joints, no spinal tenderness noted. Psychiatric: Patient alert and oriented x3, good insight and cognition, good recent to remote recall. Lymph node survey: No cervical axillary or inguinal lymphadenopathy noted.   Data Reviewed: Basic Metabolic Panel:  Recent Labs Lab 04/06/12 0535 04/07/12 0543 04/08/12 0605 04/09/12 0500  NA 135 137 136 138  K 3.0* 3.5 3.8 3.9  CL 102 107 106 109  CO2 22 20 21 21   GLUCOSE 105* 121* 112* 113*  BUN 5* 5* 7 6  CREATININE 0.48* 0.59 0.58 0.55  CALCIUM 8.8 8.4 8.5 8.4   Liver Function Tests:  Recent Labs Lab 04/06/12 0535 04/09/12 0500  AST 21 39*  ALT 12 21  ALKPHOS 85 86  BILITOT 4.0* 4.6*  PROT 8.1 7.0  ALBUMIN 4.2 3.6   No results found for this basename: LIPASE, AMYLASE,  in the last 168 hours No results found for this basename: AMMONIA,  in the last 168  hours CBC:  Recent Labs Lab 04/06/12 0535 04/07/12 0543 04/08/12 0605 04/09/12 0500 04/10/12 0630  WBC 23.9* 21.7* 24.6* 22.1* 18.3*  NEUTROABS 19.0*  --   --   --   --   HGB 8.2* 7.2* 7.0* 6.4* 7.0*  HCT 28.2* 20.8* 20.3* 18.7* 19.8*  MCV 110.6* 94.5 93.1 92.6 89.2  PLT 514* 466* 467* 455* 408*   Cardiac Enzymes: No results found for this basename: CKTOTAL, CKMB, CKMBINDEX, TROPONINI,  in the last 168 hours BNP (last 3 results)  Recent Labs  06/07/11 0650 07/05/11 0620 07/18/11 1500  PROBNP 7.4 219.1* 13.5   CBG: No results found for this basename: GLUCAP,  in the last 168 hours  No results found for this or any previous visit (from the past 240 hour(s)).   Studies: Dg Chest 2 View  04/06/2012  *RADIOLOGY REPORT*  Clinical Data: Chest pain.  Sickle cell crisis.  CHEST - 2 VIEW  Comparison: Radiographs 03/28/2012.  CT 03/01/2012.  Findings: The left subclavian power port is unchanged in the lower SVC.  Heart size and mediastinal contours are stable.  Chronic central airway thickening at both lung bases appears stable.  There is no superimposed airspace disease, pleural effusion or pneumothorax.  IMPRESSION: Stable chronic lung disease.  No acute cardiopulmonary process identified.   Original Report Authenticated By: Carey Bullocks, M.D.    Dg Chest 2 View  03/28/2012  *RADIOLOGY REPORT*  Clinical Data: Dyspnea on exertion  CHEST - 2 VIEW  Comparison: 03/17/2012  Findings: Left chest wall porta-catheter is noted with tip in the projection of the cavoatrial junction.  There is mild cardiac enlargement.  There is no pleural effusion or edema.  No airspace consolidation noted.  IMPRESSION:  1.  No acute cardiopulmonary abnormalities.   Original Report Authenticated By: Signa Kell, M.D.    Dg Chest 2 View  03/17/2012  *RADIOLOGY REPORT*  Clinical Data: 32 year old male with chest pain - history of sickle cell disease.  CHEST - 2 VIEW  Comparison: 03/01/2012  Findings:  Cardiomegaly and left-sided Port-A-Cath again noted. Mild interstitial prominence is again noted. There is no evidence of focal airspace disease, pulmonary edema, suspicious pulmonary nodule/mass, pleural effusion, or pneumothorax. No acute bony abnormalities are identified.  IMPRESSION: Cardiomegaly without evidence of acute cardiopulmonary disease   Original Report Authenticated By: Harmon Pier, M.D.     Scheduled Meds: . deferoxamine (DESFERAL) IV  2,000 mg Intravenous QHS  . folic acid  1 mg Oral q morning - 10a  .  HYDROmorphone (DILAUDID) injection  4 mg Intravenous Q2H  . HYDROmorphone PCA 0.3 mg/mL   Intravenous Q4H  . hydroxyurea  500 mg Oral BID  . ketorolac  30 mg Intravenous Q6H  . lisinopril  10 mg Oral Q2200  . OLANZapine  10 mg Oral QHS  . OxyCODONE  80 mg Oral Q12H  . senna  1 tablet Oral BID  . warfarin  7.5 mg Oral ONCE-1800  . Warfarin - Pharmacist Dosing Inpatient   Does not apply q1800   Continuous Infusions: . sodium chloride 75 mL/hr at 04/11/12 [phone]

## 2012-04-11 NOTE — Progress Notes (Signed)
ANTICOAGULATION CONSULT NOTE - Follow up Consult  Pharmacy Consult for Warfarin Indication:  History of PE/DVT  Allergies  Allergen Reactions  . Morphine And Related Hives and Rash    Pt states he is able to tolerate Dilaudid with no reactions.   Patient Measurements: Height: 6' (182.9 cm) Weight: 177 lb 6.4 oz (80.468 kg) IBW/kg (Calculated) : 77.6  Vital Signs: Temp: 98.3 F (36.8 C) (03/12 0949) Temp src: Oral (03/12 0949) BP: 137/77 mmHg (03/12 0949) Pulse Rate: 101 (03/12 0949)  Labs:  Recent Labs  04/09/12 0500 04/10/12 0630 04/11/12 0550  HGB 6.4* 7.0*  --   HCT 18.7* 19.8*  --   PLT 455* 408*  --   LABPROT 21.0* 26.5* 24.1*  INR 1.89* 2.59* 2.28*  CREATININE 0.55  --   --    Estimated Creatinine Clearance: 145.5 ml/min (by C-G formula based on Cr of 0.55).  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  Pt on chronic coumadin PTA for h/o PE/DVT.  Pharmacy asked to resume coumadin.    INR on admission was subtherapeutic @ 1.35.  PTA dose: 5mg  daily except 7.5mg  MWF  INR = 2.28, Warfarin doses from 3/7 = 10, 10, 7.5, 10 5mg   Hgb 7.0, one unit PRBC 3/11  Concurrent Toradol x 5 days  Monitor CBC  Goal of Therapy:  INR 2-3   Plan:  Warfarin 7.5mg  today  Daily INR.  Otho Bellows PharmD Pager [phone] 04/11/2012, 10:42 AM

## 2012-04-12 DIAGNOSIS — D571 Sickle-cell disease without crisis: Secondary | ICD-10-CM | POA: Diagnosis not present

## 2012-04-12 DIAGNOSIS — I2782 Chronic pulmonary embolism: Secondary | ICD-10-CM | POA: Diagnosis not present

## 2012-04-12 LAB — CBC
Hemoglobin: 6.8 g/dL — CL (ref 13.0–17.0)
MCV: 89.1 fL (ref 78.0–100.0)
Platelets: 461 10*3/uL — ABNORMAL HIGH (ref 150–400)
RBC: 2.21 MIL/uL — ABNORMAL LOW (ref 4.22–5.81)
WBC: 16.9 10*3/uL — ABNORMAL HIGH (ref 4.0–10.5)

## 2012-04-12 LAB — PREPARE RBC (CROSSMATCH)

## 2012-04-12 MED ORDER — WARFARIN SODIUM 7.5 MG PO TABS
7.5000 mg | ORAL_TABLET | Freq: Once | ORAL | Status: AC
  Administered 2012-04-12: 7.5 mg via ORAL
  Filled 2012-04-12 (×2): qty 1
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
## 2012-04-12 NOTE — Progress Notes (Signed)
ANTICOAGULATION CONSULT NOTE - Follow up Consult  Pharmacy Consult for Warfarin Indication:  History of PE/DVT  Allergies  Allergen Reactions  . Morphine And Related Hives and Rash    Pt states he is able to tolerate Dilaudid with no reactions.   Patient Measurements: Height: 6' (182.9 cm) Weight: 177 lb 6.4 oz (80.468 kg) IBW/kg (Calculated) : 77.6  Vital Signs: Temp: 98.6 F (37 C) (03/13 1019) Temp src: Oral (03/13 0722) BP: 130/85 mmHg (03/13 1019) Pulse Rate: 80 (03/13 1019)  Labs:  Recent Labs  04/10/12 0630 04/11/12 0550 04/12/12 0530  HGB 7.0*  --   --   HCT 19.8*  --   --   PLT 408*  --   --   LABPROT 26.5* 24.1* 22.2*  INR 2.59* 2.28* 2.04*   Estimated Creatinine Clearance: 145.5 ml/min (by C-G formula based on Cr of 0.55).  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  Pt on chronic coumadin PTA for h/o PE/DVT.  Pharmacy asked to resume coumadin.    INR on admission was subtherapeutic @ 1.35.  PTA dose: 5mg  daily except 7.5mg  MWF  INR 2.04 remains therapeutic today  Hgb 7.0, one unit PRBC 3/11 - no CBC today.  No bleeding reported  Concurrent Toradol x 5 days  Goal of Therapy:  INR 2-3   Plan:  Repeat 7.5 mg today   Daily INR.  Clydene Fake PharmD Pager #: [phone] 12:08 PM 04/12/2012

## 2012-04-12 NOTE — Progress Notes (Signed)
CRITICAL VALUE ALERT  Critical value received: Hemoglobin of 6.8   Date of notification:  04/12/12  Time of notification:  1715  Critical value read back:yes  Nurse who received alert:  Eugene Garnet RN    MD notified (1st page):  Dr. Earlene Plater  Time of first page:  1720  MD notified (2nd page): Dr. Earlene Plater  Time of second page: 1745  Responding MD:  Dr. Earlene Plater  Time MD responded:  [phone]

## 2012-04-13 DIAGNOSIS — I2699 Other pulmonary embolism without acute cor pulmonale: Secondary | ICD-10-CM | POA: Diagnosis not present

## 2012-04-13 DIAGNOSIS — I82C29 Chronic embolism and thrombosis of unspecified internal jugular vein: Secondary | ICD-10-CM | POA: Diagnosis not present

## 2012-04-13 DIAGNOSIS — E876 Hypokalemia: Secondary | ICD-10-CM | POA: Diagnosis not present

## 2012-04-13 DIAGNOSIS — I2782 Chronic pulmonary embolism: Secondary | ICD-10-CM | POA: Diagnosis not present

## 2012-04-13 DIAGNOSIS — D5700 Hb-SS disease with crisis, unspecified: Secondary | ICD-10-CM | POA: Diagnosis not present

## 2012-04-13 LAB — COMPREHENSIVE METABOLIC PANEL
AST: 35 U/L (ref 0–37)
Albumin: 3.6 g/dL (ref 3.5–5.2)
Alkaline Phosphatase: 97 U/L (ref 39–117)
Chloride: 103 mEq/L (ref 96–112)
Potassium: 3.9 mEq/L (ref 3.5–5.1)
Total Bilirubin: 3.1 mg/dL — ABNORMAL HIGH (ref 0.3–1.2)

## 2012-04-13 LAB — TYPE AND SCREEN: Unit division: 0

## 2012-04-13 LAB — CBC
HCT: 22.1 % — ABNORMAL LOW (ref 39.0–52.0)
Platelets: 491 10*3/uL — ABNORMAL HIGH (ref 150–400)
RDW: 21.2 % — ABNORMAL HIGH (ref 11.5–15.5)
WBC: 13.1 10*3/uL — ABNORMAL HIGH (ref 4.0–10.5)

## 2012-04-13 LAB — PROTIME-INR: Prothrombin Time: 24.1 seconds — ABNORMAL HIGH (ref 11.6–15.2)

## 2012-04-13 MED ORDER — WARFARIN SODIUM 5 MG PO TABS
5.0000 mg | ORAL_TABLET | Freq: Once | ORAL | Status: AC
  Administered 2012-04-13: 5 mg via ORAL
  Filled 2012-04-13: qty 1
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
## 2012-04-13 NOTE — Progress Notes (Signed)
ANTICOAGULATION CONSULT NOTE - Follow up Consult  Pharmacy Consult for Warfarin Indication:  History of PE/DVT  Allergies  Allergen Reactions  . Morphine And Related Hives and Rash    Pt states he is able to tolerate Dilaudid with no reactions.   Patient Measurements: Height: 6' (182.9 cm) Weight: 177 lb (80.287 kg) IBW/kg (Calculated) : 77.6  Vital Signs: Temp: 98.4 F (36.9 C) (03/14 0520) Temp src: Oral (03/14 0520) BP: 115/69 mmHg (03/14 0520) Pulse Rate: 74 (03/14 0520)  Labs:  Recent Labs  04/11/12 0550 04/12/12 0530 04/12/12 1620 04/13/12 0715  HGB  --   --  6.8* 7.5*  HCT  --   --  19.7* 22.1*  PLT  --   --  461* 491*  LABPROT 24.1* 22.2*  --  24.1*  INR 2.28* 2.04*  --  2.28*   Estimated Creatinine Clearance: 145.5 ml/min (by C-G formula based on Cr of 0.55).  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  Pt on chronic coumadin PTA for h/o PE/DVT.      INR on admission was subtherapeutic @ 1.35.  PTA dose: 5mg  daily except 7.5mg  MWF  INR 2.28 remains therapeutic today   CBC okay today after 1 unit PRBC yesterday,  no bleeding reported   Goal of Therapy:  INR 2-3   Plan:  Warfarin 5 mg today   Daily INR.  Borgerding, Loma Messing PharmD Pager #: [phone] 7:45 AM 04/13/2012

## 2012-04-13 NOTE — Progress Notes (Signed)
MD, tried to pick up blood at 0020am but type and cross was expired (for 20 minutes).  Had to place an order for another type and screen and add crossmatch.  Now just waiting for antibodies to be processed and type and screen to be checked with unit in blood bank in order to hang.  Waiting for blood bank to call me when the labs are processed then will hang blood ASAP.[name]

## 2012-04-13 NOTE — Progress Notes (Signed)
MD, still waiting for type and cross to process before I can hang the blood.  For the am, patient reports that he would like his Toradol continued (order expired after 5 days). Thanks, Kenton Kingfisher [place]

## 2012-04-14 DIAGNOSIS — D5700 Hb-SS disease with crisis, unspecified: Secondary | ICD-10-CM | POA: Diagnosis not present

## 2012-04-14 DIAGNOSIS — I10 Essential (primary) hypertension: Secondary | ICD-10-CM | POA: Diagnosis not present

## 2012-04-14 DIAGNOSIS — I2699 Other pulmonary embolism without acute cor pulmonale: Secondary | ICD-10-CM | POA: Diagnosis not present

## 2012-04-14 DIAGNOSIS — E876 Hypokalemia: Secondary | ICD-10-CM | POA: Diagnosis not present

## 2012-04-14 DIAGNOSIS — D72829 Elevated white blood cell count, unspecified: Secondary | ICD-10-CM | POA: Diagnosis not present

## 2012-04-14 DIAGNOSIS — I2782 Chronic pulmonary embolism: Secondary | ICD-10-CM | POA: Diagnosis not present

## 2012-04-14 DIAGNOSIS — I82C29 Chronic embolism and thrombosis of unspecified internal jugular vein: Secondary | ICD-10-CM | POA: Diagnosis not present

## 2012-04-14 LAB — CBC
HCT: 21.9 % — ABNORMAL LOW (ref 39.0–52.0)
Hemoglobin: 7.7 g/dL — ABNORMAL LOW (ref 13.0–17.0)
MCH: 31.7 pg (ref 26.0–34.0)
MCHC: 35.2 g/dL (ref 30.0–36.0)

## 2012-04-14 LAB — TYPE AND SCREEN: Unit division: 0

## 2012-04-14 MED ORDER — ZOLPIDEM TARTRATE 10 MG PO TABS: 10.0000 mg | ORAL_TABLET | Freq: Every evening | ORAL | Status: DC | PRN

## 2012-04-14 MED ORDER — OXYCODONE HCL 80 MG PO TB12: 80.0000 mg | ORAL_TABLET | Freq: Two times a day (BID) | ORAL | Status: DC

## 2012-04-14 MED ORDER — WARFARIN SODIUM 7.5 MG PO TABS
7.5000 mg | ORAL_TABLET | Freq: Once | ORAL | Status: DC
  Filled 2012-04-14: qty 1

## 2012-04-14 MED ORDER — HYDROMORPHONE HCL 4 MG PO TABS: 4.0000 mg | ORAL_TABLET | ORAL | Status: DC | PRN

## 2012-04-14 MED ORDER — HEPARIN SOD (PORK) LOCK FLUSH 100 UNIT/ML IV SOLN
INTRAVENOUS | Status: AC
  Administered 2012-04-14: 14:00:00
  Filled 2012-04-14: qty 5

## 2012-04-14 NOTE — Progress Notes (Signed)
ANTICOAGULATION CONSULT NOTE - Follow up Consult  Pharmacy Consult for Warfarin Indication:  History of PE/DVT  Allergies  Allergen Reactions  . Morphine And Related Hives and Rash    Pt states he is able to tolerate Dilaudid with no reactions.   Patient Measurements: Height: 6' (182.9 cm) Weight: 173 lb 6.4 oz (78.654 kg) (bed scale) IBW/kg (Calculated) : 77.6  Vital Signs: Temp: 98.6 F (37 C) (03/15 0543) Temp src: Oral (03/15 0543) BP: 108/65 mmHg (03/15 0543) Pulse Rate: 76 (03/15 0543)  Labs:  Recent Labs  04/12/12 0530  04/12/12 1620 04/13/12 0715 04/14/12 0540  HGB  --   < > 6.8* 7.5* 7.7*  HCT  --   --  19.7* 22.1* 21.9*  PLT  --   --  461* 491* 444*  LABPROT 22.2*  --   --  24.1* 21.6*  INR 2.04*  --   --  2.28* 1.96*  CREATININE  --   --   --  0.54  --   < > = values in this interval not displayed. Estimated Creatinine Clearance: 145.5 ml/min (by C-G formula based on Cr of 0.54).  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  Pt on chronic coumadin PTA for h/o PE/DVT.      INR on admission was subtherapeutic @ 1.35.  PTA dose: 5mg  daily except 7.5mg  MWF  INR slightly subtherapeutic today (1.96)  CBC low but essentially stable s/p PRBC 3/13 No bleeding/complications reported.   Goal of Therapy:  INR 2-3   Plan:   Warfarin 7.5 mg today    Daily INR.  Loralee Pacas, PharmD, BCPS Pager: [phone] 8:30 AM 04/14/2012
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
## 2012-04-23 ENCOUNTER — Telehealth (HOSPITAL_COMMUNITY): Payer: Self-pay | Admitting: Hematology

## 2012-04-23 NOTE — Telephone Encounter (Signed)
Patient called C/O pain to rib cages on both sides.  Denies shortness of breath, or any other symptoms.  Pain is now 8/10.  Has taken his medication as prescribed.  Advised I would notify the NP and call him back

## 2012-04-24 ENCOUNTER — Non-Acute Institutional Stay (HOSPITAL_COMMUNITY)
Admission: AD | Admit: 2012-04-24 | Discharge: 2012-04-25 | Disposition: A | Discharge status: Home / Self Care | Payer: Medicare Other | Source: Home / Self Care | Attending: Internal Medicine | Admitting: Internal Medicine

## 2012-04-24 DIAGNOSIS — F39 Unspecified mood [affective] disorder: Secondary | ICD-10-CM | POA: Insufficient documentation

## 2012-04-24 DIAGNOSIS — I2782 Chronic pulmonary embolism: Secondary | ICD-10-CM | POA: Diagnosis present

## 2012-04-24 DIAGNOSIS — D473 Essential (hemorrhagic) thrombocythemia: Secondary | ICD-10-CM | POA: Diagnosis present

## 2012-04-24 DIAGNOSIS — I10 Essential (primary) hypertension: Secondary | ICD-10-CM | POA: Diagnosis present

## 2012-04-24 DIAGNOSIS — Z96649 Presence of unspecified artificial hip joint: Secondary | ICD-10-CM | POA: Insufficient documentation

## 2012-04-24 DIAGNOSIS — R52 Pain, unspecified: Secondary | ICD-10-CM | POA: Diagnosis not present

## 2012-04-24 DIAGNOSIS — Z79899 Other long term (current) drug therapy: Secondary | ICD-10-CM | POA: Insufficient documentation

## 2012-04-24 DIAGNOSIS — R110 Nausea: Secondary | ICD-10-CM | POA: Diagnosis not present

## 2012-04-24 DIAGNOSIS — Z86718 Personal history of other venous thrombosis and embolism: Secondary | ICD-10-CM | POA: Insufficient documentation

## 2012-04-24 DIAGNOSIS — Z7901 Long term (current) use of anticoagulants: Secondary | ICD-10-CM | POA: Insufficient documentation

## 2012-04-24 DIAGNOSIS — D72829 Elevated white blood cell count, unspecified: Secondary | ICD-10-CM | POA: Insufficient documentation

## 2012-04-24 DIAGNOSIS — Z87891 Personal history of nicotine dependence: Secondary | ICD-10-CM

## 2012-04-24 DIAGNOSIS — E876 Hypokalemia: Secondary | ICD-10-CM | POA: Diagnosis present

## 2012-04-24 DIAGNOSIS — K5900 Constipation, unspecified: Secondary | ICD-10-CM | POA: Diagnosis present

## 2012-04-24 DIAGNOSIS — I2699 Other pulmonary embolism without acute cor pulmonale: Secondary | ICD-10-CM | POA: Diagnosis not present

## 2012-04-24 DIAGNOSIS — Z86711 Personal history of pulmonary embolism: Secondary | ICD-10-CM | POA: Insufficient documentation

## 2012-04-24 DIAGNOSIS — D5700 Hb-SS disease with crisis, unspecified: Principal | ICD-10-CM | POA: Diagnosis present

## 2012-04-24 LAB — COMPREHENSIVE METABOLIC PANEL
CO2: 25 mEq/L (ref 19–32)
Calcium: 9 mg/dL (ref 8.4–10.5)
Creatinine, Ser: 0.59 mg/dL (ref 0.50–1.35)
GFR calc Af Amer: >90 mL/min (ref >90)
GFR calc non Af Amer: >90 mL/min (ref >90)
Glucose, Bld: 88 mg/dL (ref 70–99)

## 2012-04-24 LAB — CBC WITH DIFFERENTIAL/PLATELET
Basophils Relative: 1 % (ref 0–1)
Eosinophils Absolute: 0.6 10*3/uL (ref 0.0–0.7)
Hemoglobin: 8 g/dL — ABNORMAL LOW (ref 13.0–17.0)
Lymphocytes Relative: 22 % (ref 12–46)
MCHC: 33.3 g/dL (ref 30.0–36.0)
Neutrophils Relative %: 60 % (ref 43–77)
RBC: 2.58 MIL/uL — ABNORMAL LOW (ref 4.22–5.81)

## 2012-04-24 LAB — RETICULOCYTES: RBC.: 2.6 MIL/uL — ABNORMAL LOW (ref 4.22–5.81)

## 2012-04-24 IMAGING — CT CT ABD-PELV W/ CM
1 series · 15 of 32 positions shown, 19 images · IV contrast (agent unspecified)
Comparison: 07/14/2006

CLINICAL DATA: Sickle cell crisis.  Right-sided abdominal pain.
Possible incisional hernia.

CT ABDOMEN AND PELVIS WITH CONTRAST
TECHNIQUE: Multidetector CT imaging of the abdomen and pelvis was
performed following the standard protocol during bolus
administration of intravenous contrast.
Contrast: 100 ml [id number]

[Series 2: rtn ap with st · axial · 0.65mm/px · z∈[-460,-60]mm · 15 of 89 slices shown, 19 images]
[im 6/89  soft-tissue]
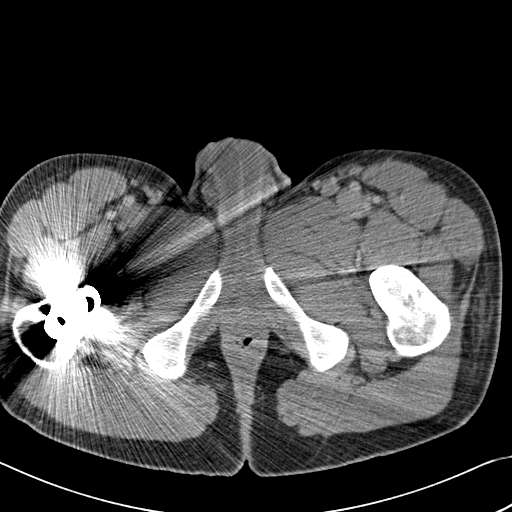
[im 6/89  bone]
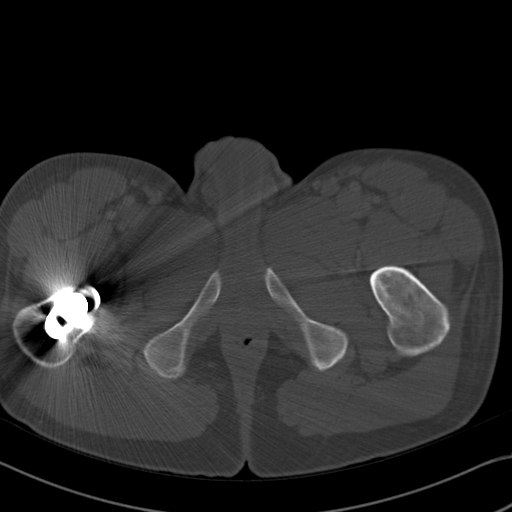
[im 15/89  soft-tissue]
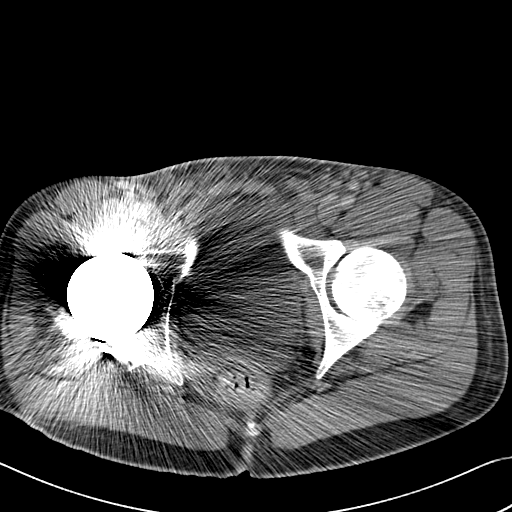
[im 20/89  soft-tissue]
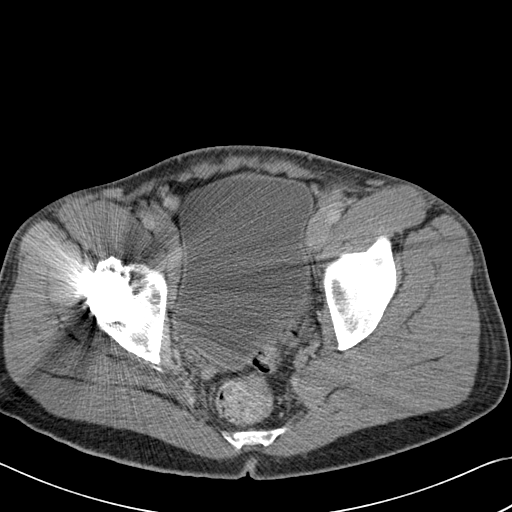
[im 26/89  soft-tissue]
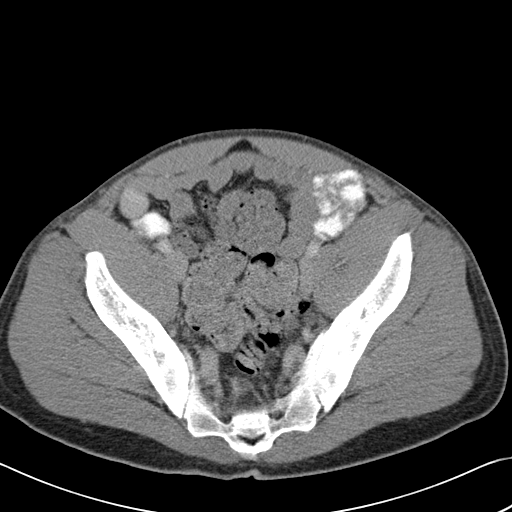
[im 35/89  soft-tissue]
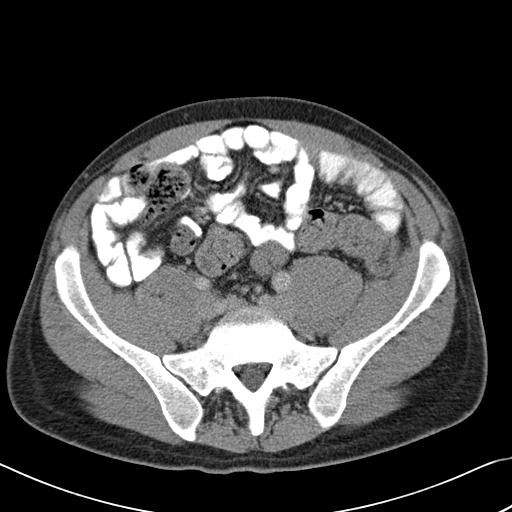
[im 40/89  soft-tissue]
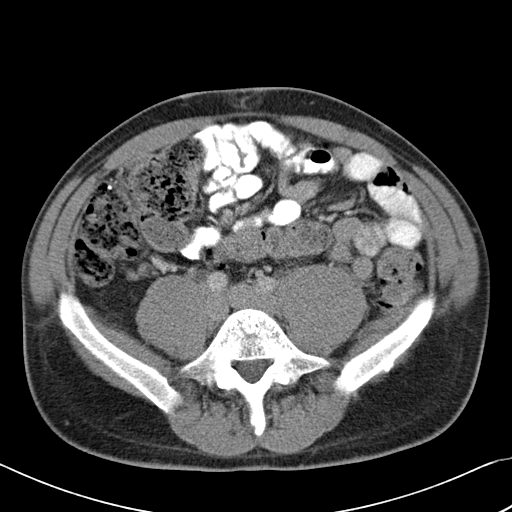
[im 46/89  soft-tissue]
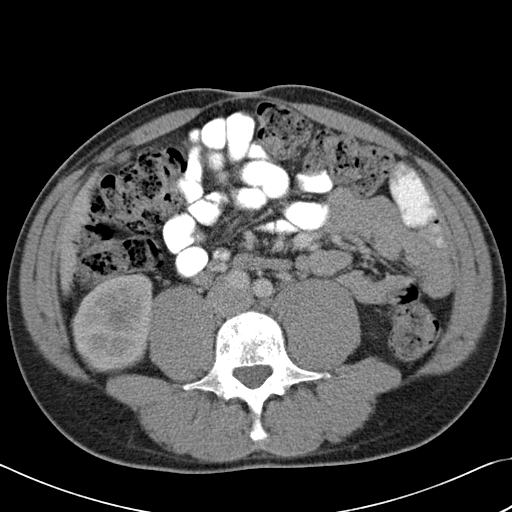
[im 52/89  soft-tissue]
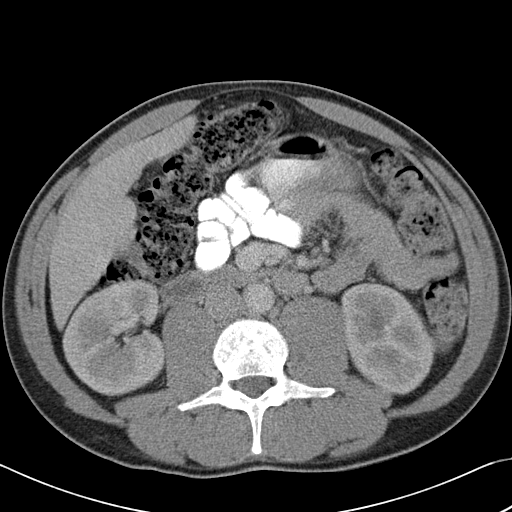
[im 57/89  soft-tissue]
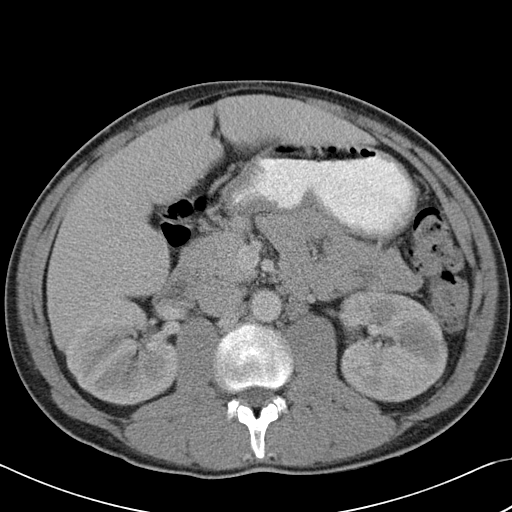
[im 57/89  bone]
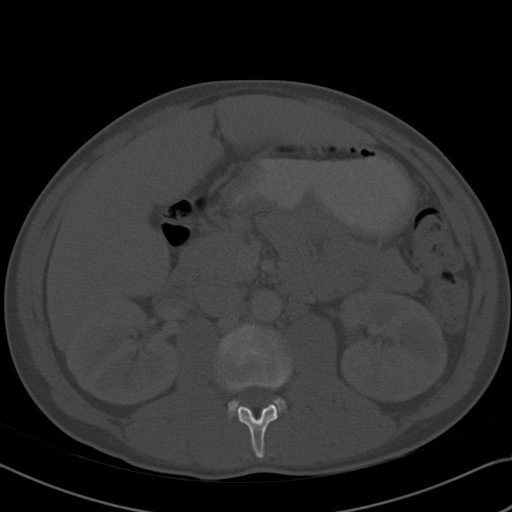
[im 66/89  soft-tissue]
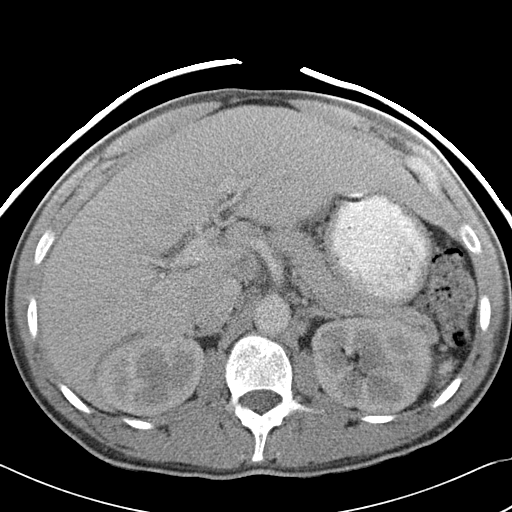
[im 71/89  soft-tissue]
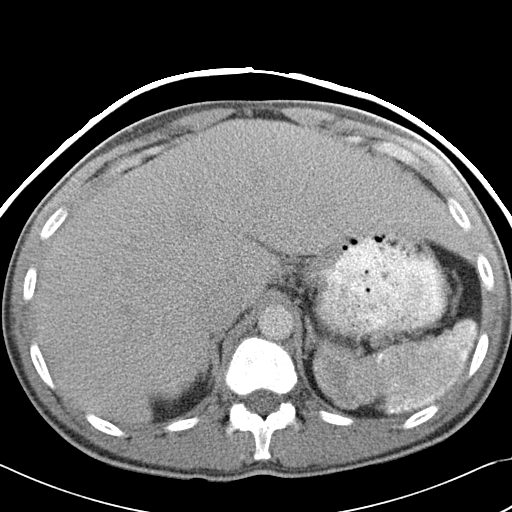
[im 77/89  soft-tissue]
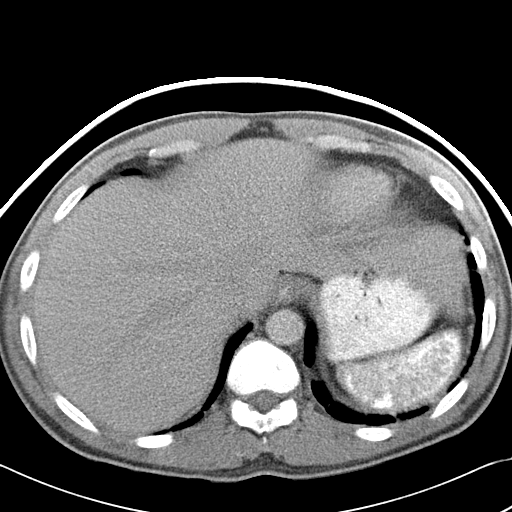
[im 77/89  lung]
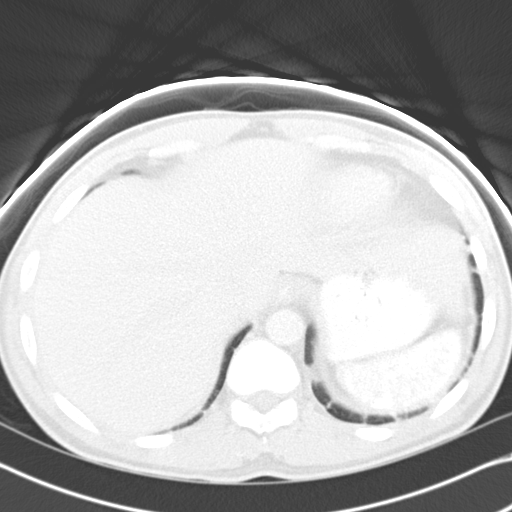
[im 80/89  lung]
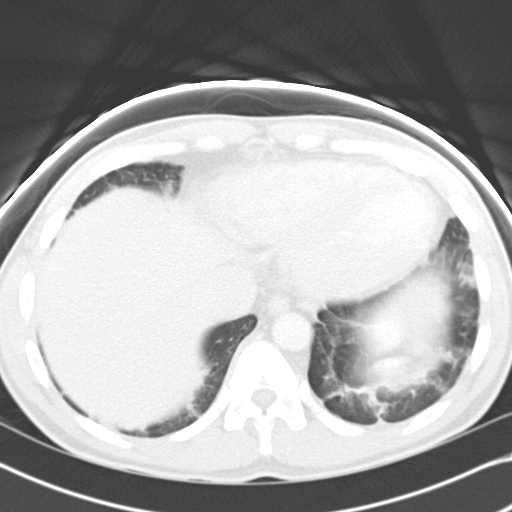
[im 83/89  soft-tissue]
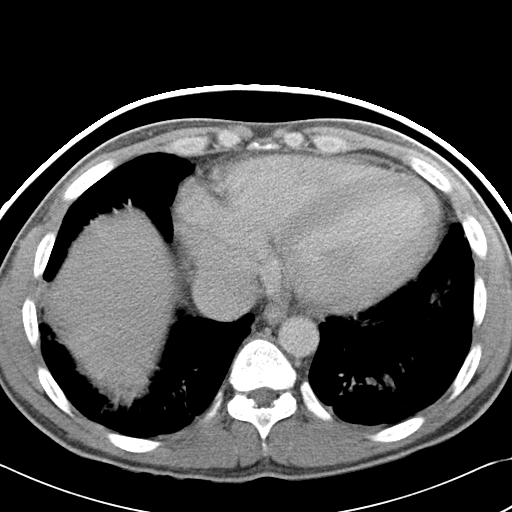
[im 83/89  lung]
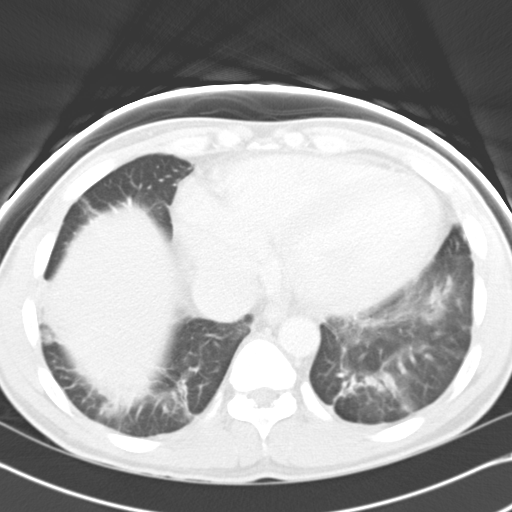
[im 86/89  lung]
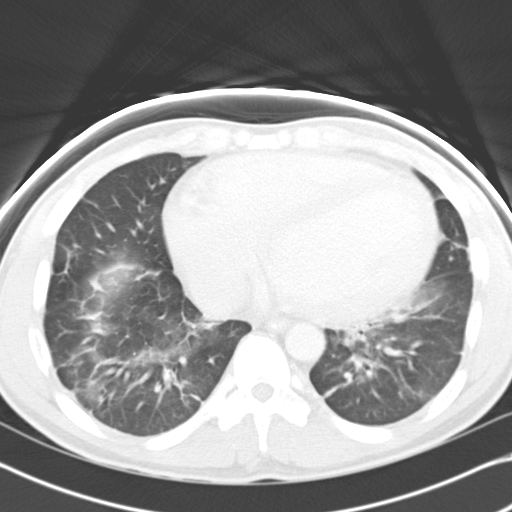

[15 of 32 positions shown; findings below may reference images not displayed]

FINDINGS: Stable cardiomegaly noted predominately involving the
ventricles.  There is volume loss in both lower lobes with
scattered mild atelectasis.  No pleural effusion identified.

Chronic reticular calcifications are present in the spleen
suggesting chronic infarction.  The gallbladder is absent.

The pancreas appears normal.  Lobular density in the gastrohepatic
ligament may represent small varices or chronic adenopathy.

There is some scarring in the left mid kidney.  Small
retroperitoneal lymph nodes are not pathologically enlarged by size
criteria.

There is mild prominence of stool in the colon.  The appendix is at
the upper limits of normal in size, but without surrounding
inflammatory findings.

A right hip implant is present along with evidence of prior
fixation along the right posterior acetabular wall.  Small pelvic
lymph nodes and inguinal lymph nodes do not appear pathologically
enlarged by size criteria.

No free pelvic fluid noted.

Heterogeneous density in the spine and sacrum is compatible with
bone infarcts.  There is evidence of avascular necrosis in the left
femoral head, without flattening identified.  An umbilical hernia
contains adipose tissue.
IMPRESSION: 1.  Similar appearance to the prior exam, with stable cardiomegaly,
chronic infarction of the spleen, bone infarcts, and borderline
prominence of the appendix without periappendiceal stranding.
2.  Mild prominence of stool in the colon may reflect constipation.
3.  There is evidence of avascular necrosis of the left femoral
head, without flattening.  Prior right hip implant noted.
4.  Umbilical hernia contains adipose tissue.
5.  Stable appearance of lobular density in the vicinity of the
gastrohepatic ligament possibly representing chronic mild
adenopathy or small varices.

## 2012-04-24 MED ORDER — OLANZAPINE 10 MG PO TABS
10.0000 mg | ORAL_TABLET | Freq: Every day | ORAL | Status: DC
  Administered 2012-04-24: 10 mg via ORAL
  Filled 2012-04-24 (×2): qty 1

## 2012-04-24 MED ORDER — ZOLPIDEM TARTRATE 5 MG PO TABS: 5.0000 mg | ORAL_TABLET | Freq: Every evening | ORAL | Status: DC | PRN

## 2012-04-24 MED ORDER — HEPARIN SOD (PORK) LOCK FLUSH 100 UNIT/ML IV SOLN
500.0000 [IU] | INTRAVENOUS | Status: DC | PRN
  Filled 2012-04-24: qty 5

## 2012-04-24 MED ORDER — SENNA 8.6 MG PO TABS
1.0000 | ORAL_TABLET | Freq: Two times a day (BID) | ORAL | Status: DC
  Filled 2012-04-24: qty 1

## 2012-04-24 MED ORDER — HYDROMORPHONE HCL PF 2 MG/ML IJ SOLN
2.0000 mg | INTRAMUSCULAR | Status: DC | PRN
  Administered 2012-04-25: 2 mg via INTRAVENOUS
  Administered 2012-04-25 (×5): 4 mg via INTRAVENOUS
  Filled 2012-04-24 (×4): qty 2
  Filled 2012-04-24: qty 1
  Filled 2012-04-24 (×2): qty 2

## 2012-04-24 MED ORDER — DEFEROXAMINE MESYLATE 2 G IJ SOLR
2000.0000 mg | Freq: Every day | INTRAMUSCULAR | Status: DC
  Administered 2012-04-24: 2000 mg via INTRAVENOUS
  Filled 2012-04-24 (×2): qty 2

## 2012-04-24 MED ORDER — FOLIC ACID 1 MG PO TABS
1.0000 mg | ORAL_TABLET | Freq: Every day | ORAL | Status: DC
  Administered 2012-04-24 – 2012-04-25 (×2): 1 mg via ORAL
  Filled 2012-04-24 (×2): qty 1

## 2012-04-24 MED ORDER — DEXTROSE-NACL 5-0.45 % IV SOLN
INTRAVENOUS | Status: DC
  Administered 2012-04-24 – 2012-04-25 (×2): via INTRAVENOUS

## 2012-04-24 MED ORDER — ONDANSETRON HCL 4 MG/2ML IJ SOLN: 4.0000 mg | INTRAMUSCULAR | Status: DC | PRN

## 2012-04-24 MED ORDER — ONDANSETRON HCL 8 MG PO TABS: 4.0000 mg | ORAL_TABLET | ORAL | Status: DC | PRN

## 2012-04-24 MED ORDER — HYDROXYUREA 500 MG PO CAPS
500.0000 mg | ORAL_CAPSULE | Freq: Two times a day (BID) | ORAL | Status: DC
  Administered 2012-04-24 – 2012-04-25 (×2): 500 mg via ORAL
  Filled 2012-04-24 (×2): qty 1

## 2012-04-24 MED ORDER — DEXTROSE-NACL 5-0.45 % IV SOLN: INTRAVENOUS | Status: DC

## 2012-04-24 MED ORDER — HYDROMORPHONE HCL PF 2 MG/ML IJ SOLN
4.0000 mg | INTRAMUSCULAR | Status: AC
  Administered 2012-04-24 (×3): 4 mg via INTRAVENOUS
  Filled 2012-04-24 (×2): qty 2

## 2012-04-24 MED ORDER — SODIUM CHLORIDE 0.9 % IJ SOLN: 10.0000 mL | INTRAMUSCULAR | Status: DC | PRN

## 2012-04-24 MED ORDER — KETOROLAC TROMETHAMINE 30 MG/ML IJ SOLN
30.0000 mg | Freq: Four times a day (QID) | INTRAMUSCULAR | Status: DC
  Administered 2012-04-24 – 2012-04-25 (×4): 30 mg via INTRAVENOUS
  Filled 2012-04-24 (×4): qty 1

## 2012-04-24 NOTE — H&P (Signed)
[facility] History and Physical   Date: 04/24/2012  Patient name: [name] Medical record number: [id number] Date of birth: Aug 31, 1980 Age: 32 y.o. Gender: male PCP: [name], [name], MD  Attending physician: Altha Harm, MD  Chief Complaint:  Bilateral pain rib cages     History of Present Illness: 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 He has pain localized to his rib cage area rates his pain 9/10 describes as sharpe, stabbing and constant. No palliative measures.that presented to the ED complaining of pain. His pain is located at his ribs and back. He states t PE/DVT on chronic anticoagulation. He contributes his crisis to variable weather changes . He denies any fevers, chills, sick contacts, dysuria, difficulty breathing, cough, or SOB.   Meds: Prescriptions prior to admission  Medication Sig Dispense Refill  . folic acid (FOLVITE) 1 MG tablet Take 1 mg by mouth every morning.      Marland Kitchen HYDROmorphone (DILAUDID) 4 MG tablet Take 1-2 tablets (4-8 mg total) by mouth every 4 (four) hours as needed for pain. For pain.  60 tablet  0  . hydroxyurea (HYDREA) 500 MG capsule Take 1 capsule (500 mg total) by mouth 2 (two) times daily. May take with food to minimize GI side effects.  60 capsule  2  . ibuprofen (ADVIL,MOTRIN) 800 MG tablet Take 1 tablet (800 mg total) by mouth every 8 (eight) hours as needed for pain. Do not exceed use for more than 20/30 days  30 tablet  0  . lisinopril (PRINIVIL,ZESTRIL) 10 MG tablet Take 1 tablet (10 mg total) by mouth daily at 10 pm.  30 tablet  2  . OLANZapine (ZYPREXA) 10 MG tablet Take 10 mg by mouth at bedtime.      Marland Kitchen oxyCODONE (OXYCONTIN) 80 MG 12 hr tablet Take 1 tablet (80 mg total) by mouth every 12 (twelve) hours.  60 tablet  0  . potassium chloride SA (K-DUR,KLOR-CON) 20 MEQ tablet Take 1 tablet (20 mEq total) by mouth 2 (two) times  daily.  60 tablet  1  . warfarin (COUMADIN) 5 MG tablet Take 5-7.5 mg by mouth every evening. Takes 5 mg daily except Monday Wednesday and Friday he takes 7.5 mg      . zolpidem (AMBIEN) 10 MG tablet Take 1 tablet (10 mg total) by mouth at bedtime as needed. For sleep  30 tablet  0    Allergies: Morphine and related Past Medical History  Diagnosis Date  . Sickle cell anemia   . Blood transfusion   . 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 Hypokalemia   . Mood disorder   . Pulmonary embolism   . Avascular necrosis   . Leukocytosis     Chronic  . Thrombocytosis     Chronic  . Hypertension    Past Surgical History  Procedure Laterality Date  . Right hip replacement      08/2006  . Cholecystectomy      01/2008  . Porta cath placement    . Porta cath removal    . Umbilical hernia repair      01/2008  . Excision of left periauricular cyst      10/2009  . Excision of right ear lobe cyst with primary closur      11/2007  . Portacath placement  01/05/2012    Procedure:  INSERTION PORT-A-CATH;  Surgeon: Adolph Pollack, MD;  Location: [facility] OR;  Service: General;  Laterality: N/A;  ultrasound guiced port a cath insertion with fluoroscopy   Family History  Problem Relation Age of Onset  . Sickle cell anemia Mother   . Sickle cell anemia Father   . Sickle cell trait Brother    History   Social History  . Marital Status: Single    Spouse Name: N/A    Number of Children: 0  . Years of Education: 13   Occupational History  . Unemployed    Social History Main Topics  . Smoking status: Former Smoker -- 13 years    Types: Cigarettes    Quit date: 07/08/2010  . Smokeless tobacco: Never Used  . Alcohol Use: No  . Drug Use: No     Comment: quit smoking 2011  . Sexually Active: Yes    Birth Control/ Protection: None   Other Topics Concern  . Not on file   Social History Narrative   Lives in an apartment.  Single.  Lives with girlfriend.  Does not  use any assist devices.        [name]:  [phone] Mom, emergency contact    Review of Systems: A comprehensive review of systems was negative except for: Musculoskeletal: positive for bilateral rib cage   Physical Exam: Blood pressure 111/65, pulse 78, temperature 98.8 F (37.1 C), temperature source Oral, resp. rate 20, height 6' (1.829 m), weight 77.111 kg (170 lb). BP 111/65  Pulse 73  Temp(Src) 98.8 F (37.1 C) (Oral)  Resp 20  Ht 6' (1.829 m)  Wt 77.111 kg (170 lb)  BMI 23.05 kg/m2  SpO2 99%  General Appearance:    Alert, cooperative, no distress, appears stated age  Head:    Normocephalic, without obvious abnormality, atraumatic  Eyes:    PERRL, iscerla , EOM's intact, fundi    benign, both eyes       Ears:    Normal TM's and external ear canals, both ears  Nose:   Nares normal, septum midline, mucosa normal, no drainage    or sinus tenderness  Throat:   Lips, mucosa, and tongue normal; teeth and gums normal  Neck:   Supple, symmetrical, trachea midline, no adenopathy;       thyroid:  No enlargement/tenderness/nodules; no carotid   bruit or JVD  Back:     Symmetric, no curvature, ROM normal, no CVA tenderness  Lungs:     Clear to auscultation bilaterally, respirations unlabored  Chest wall:    No tenderness or deformity  Heart:    Regular rate and rhythm, S1 and S2 normal, no murmur, rub   or gallop  Abdomen:     Soft, non-tender, bowel sounds active all four quadrants,    no masses, no organomegaly  Genitalia:    Denies priapism      Extremities:   Extremities normal, atraumatic, no cyanosis or edema  Pulses:   2+ and symmetric all extremities  Skin:   Skin color, texture, turgor normal, no rashes or lesions, numerous tatoos  Lymph nodes:   Cervical, supraclavicular, and axillary nodes normal  Neurologic:   CNII-XII intact. Normal strength, sensation and reflexes      throughout    Lab results: No results found for this or any previous visit (from  the past 24 hour(s)).  Imaging results:  No results found.   Assessment & Plan:  Vaso occlusive crisis/ Pain : Chronic likely caused by  hyperviscosity, sticky blood vessel and decreased oxygen carrying capacity. Treatment /plan is hydromorphone 4mg  Q 2hrs scheduled x's 3 than Q 2 hrs hydromorphone 2-4 mg prn.Adjunction added Toradol 30mg  Q 6hrs for inflammatory/reactive process.  Anemia Secondary to SCD- Hgb on admission stable follow Leukocytosis: This is secondary to bone marrow turn over and inflammatory process component. Toradol 30mg  Q 6 hrs for this inflammatory process. Renal function wnl. Continue to monitor renal function.  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 Monitor.  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  Chronic anticoagulation: missed last PT/INR will ck and follow up  Donnie Gedeon P 04/24/2012, 6:34 PM

## 2012-04-25 DIAGNOSIS — E876 Hypokalemia: Secondary | ICD-10-CM | POA: Diagnosis not present

## 2012-04-25 DIAGNOSIS — Z7901 Long term (current) use of anticoagulants: Secondary | ICD-10-CM | POA: Diagnosis not present

## 2012-04-25 DIAGNOSIS — I2782 Chronic pulmonary embolism: Secondary | ICD-10-CM | POA: Diagnosis not present

## 2012-04-25 DIAGNOSIS — D5700 Hb-SS disease with crisis, unspecified: Secondary | ICD-10-CM | POA: Diagnosis not present

## 2012-04-25 DIAGNOSIS — D473 Essential (hemorrhagic) thrombocythemia: Secondary | ICD-10-CM | POA: Diagnosis not present

## 2012-04-25 DIAGNOSIS — I10 Essential (primary) hypertension: Secondary | ICD-10-CM | POA: Diagnosis not present

## 2012-04-25 LAB — PROTIME-INR
INR: 1.31 (ref 0.00–1.49)
Prothrombin Time: 16 seconds — ABNORMAL HIGH (ref 11.6–15.2)

## 2012-04-25 MED ORDER — WARFARIN SODIUM 7.5 MG PO TABS
7.5000 mg | ORAL_TABLET | Freq: Every day | ORAL | Status: DC
  Filled 2012-04-25: qty 1

## 2012-04-25 MED ORDER — WARFARIN - PHYSICIAN DOSING INPATIENT: Freq: Every day | Status: DC

## 2012-04-25 MED ORDER — WARFARIN SODIUM 7.5 MG PO TABS: 7.5000 mg | ORAL_TABLET | Freq: Every day | ORAL | Status: DC

## 2012-04-25 MED ORDER — WARFARIN SODIUM 5 MG PO TABS: 5.0000 mg | ORAL_TABLET | Freq: Every day | ORAL | Status: DC

## 2012-04-25 MED ORDER — WARFARIN SODIUM 10 MG PO TABS
10.0000 mg | ORAL_TABLET | Freq: Once | ORAL | Status: AC
  Administered 2012-04-25: 10 mg via ORAL
  Filled 2012-04-25: qty 1

## 2012-04-25 NOTE — Progress Notes (Signed)
Patient ID: [name], male   DOB: [birth year], 32 y.o.   MRN: [id number] Discharge instructions given to patient, port de accessed and flushed per protocol, questions answered, belongings returned.

## 2012-04-25 NOTE — Discharge Summary (Signed)
[facility] Discharge Summary   Patient ID: [name] MRN: [id number] DOB/AGE: July 30, 1980 32 y.o.  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     HYDROmorphone 4 MG tablet  Commonly known as:  DILAUDID  Take 1-2 tablets (4-8 mg total) by mouth every 4 (four) hours as needed for pain. For pain.     hydroxyurea 500 MG capsule  Commonly known as:  HYDREA  Take 1 capsule (500 mg total) by mouth 2 (two) times daily. May take with food to minimize GI side effects.     ibuprofen 800 MG tablet  Commonly known as:  ADVIL,MOTRIN  Take 1 tablet (800 mg total) by mouth every 8 (eight) hours as needed for pain. Do not exceed use for more than 20/30 days     lisinopril 10 MG tablet  Commonly known as:  PRINIVIL,ZESTRIL  Take 1 tablet (10 mg total) by mouth daily at 10 pm.     OLANZapine 10 MG tablet  Commonly known as:  ZYPREXA  Take 10 mg by mouth at bedtime.     oxyCODONE 80 MG 12 hr tablet  Commonly known as:  OXYCONTIN  Take 1 tablet (80 mg total) by mouth every 12 (twelve) hours.     potassium chloride SA 20 MEQ tablet  Commonly known as:  K-DUR,KLOR-CON  Take 1 tablet (20 mEq total) by mouth 2 (two) times daily.     warfarin 5 MG tablet  Commonly known as:  COUMADIN  Take 5-7.5 mg by mouth  every evening. Takes 5 mg daily except Monday Wednesday and Friday he takes 7.5 mg     zolpidem 10 MG tablet  Commonly known as:  AMBIEN  Take 1 tablet (10 mg total) by mouth at bedtime as needed. For sleep         Consults:  Pharmacy   Significant Diagnostic Studies:  Dg Chest 2 View  04/06/2012  *RADIOLOGY REPORT*  Clinical Data: Chest pain.  Sickle cell crisis.  CHEST - 2 VIEW  Comparison: Radiographs 03/28/2012.  CT 03/01/2012.  Findings: The left subclavian power port is unchanged in the lower SVC.  Heart size and mediastinal contours are stable.  Chronic central airway thickening at both lung bases appears stable.  There is no superimposed airspace disease, pleural effusion or pneumothorax.  IMPRESSION: Stable chronic lung disease.  No acute cardiopulmonary process identified.   Original Report Authenticated By: Carey Bullocks, M.D.    Dg Chest 2 View  03/28/2012  *RADIOLOGY REPORT*  Clinical Data: Dyspnea on exertion  CHEST - 2 VIEW  Comparison: 03/17/2012  Findings: Left chest wall porta-catheter is noted with tip in the projection of the cavoatrial junction.  There is mild cardiac enlargement.  There is no pleural effusion or edema.  No airspace consolidation noted.  IMPRESSION:  1.  No acute cardiopulmonary abnormalities.   Original Report  Authenticated By: Signa Kell, M.D.      [facility] Course:  For complete details please refer to admission H and P, but in brief, 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 He has pain localized to his rib cage area rates his pain 6/10 at time of d/c continues to describes his pain  as sharpe, stabbing and constant. However, able to manage his pain at home. He was treated aggressively with D5 1/2 IV fluids for hydration , scheduled IV analgesic 4mg  Q 2hrs for initial dosing x's 3 than Q 2hrs prn 2-4 mg dependent upon pain  assessment,,Adjunction added Toradol 30mg  Q 6hrs for inflammatory/reactive process.This is secondary to bone marrow turn over and inflammatory process component. Toradol 30mg  Q 6 hrs for this inflammatory process. Home meds resumed mood disorder stable on Zyprexa , chronic constipation senokot s  And PT/INR 1.3 was sub theraputic 10mg  given and new instructions given for home.    Physical Exam at Discharge:  BP 100/64  Pulse 75  Temp(Src) 98.6 F (37 C) (Oral)  Resp 18  Ht 6' (1.829 m)  Wt 77.111 kg (170 lb)  BMI 23.05 kg/m2  SpO2 98%  JWJ:XBJYN, cooperative, no distress, appears stated age  Cardiovascular:Regular rate and rhythm, S1 and S2 normal, no murmur, rub or gallop  Respiratory:Clear to auscultation bilaterally, respirations unlabored, No vocal fremitus  Gastrointestinal:Soft, non-tender, bowel sounds active all four quadrants,  no masses, no organomegaly  Extremities:Extremities normal, atraumatic, no cyanosis or edema  Pulses: 2+ and symmetric all extremities   Disposition at Discharge: 01-Home or Self Care  Discharge Orders: Coumadin 7.5 mg Thursday and Friday, 5mg  Saturday and Sunday return to office for re check PT/INR on Monday   Condition at Discharge:   Stable  Time spent on Discharge:  Greater than 30 minutes.  SignedGrayce Sessions 04/25/2012, 7:34 PM

## 2012-04-26 ENCOUNTER — Telehealth: Payer: Self-pay | Admitting: Primary Care

## 2012-04-27 ENCOUNTER — Inpatient Hospital Stay (HOSPITAL_COMMUNITY)
Admission: EM | Admit: 2012-04-27 | Discharge: 2012-05-02 | DRG: 812 | Disposition: A | Discharge status: Home / Self Care | Payer: Medicare Other | Attending: Internal Medicine | Admitting: Internal Medicine

## 2012-04-27 ENCOUNTER — Encounter (HOSPITAL_COMMUNITY): Payer: Self-pay

## 2012-04-27 DIAGNOSIS — I10 Essential (primary) hypertension: Secondary | ICD-10-CM

## 2012-04-27 DIAGNOSIS — D571 Sickle-cell disease without crisis: Secondary | ICD-10-CM | POA: Diagnosis present

## 2012-04-27 DIAGNOSIS — I2699 Other pulmonary embolism without acute cor pulmonale: Secondary | ICD-10-CM

## 2012-04-27 DIAGNOSIS — R110 Nausea: Secondary | ICD-10-CM | POA: Diagnosis not present

## 2012-04-27 DIAGNOSIS — D75839 Thrombocytosis, unspecified: Secondary | ICD-10-CM

## 2012-04-27 DIAGNOSIS — D5700 Hb-SS disease with crisis, unspecified: Secondary | ICD-10-CM

## 2012-04-27 DIAGNOSIS — E876 Hypokalemia: Secondary | ICD-10-CM

## 2012-04-27 DIAGNOSIS — D72829 Elevated white blood cell count, unspecified: Secondary | ICD-10-CM

## 2012-04-27 DIAGNOSIS — D473 Essential (hemorrhagic) thrombocythemia: Secondary | ICD-10-CM

## 2012-04-27 LAB — BASIC METABOLIC PANEL
Chloride: 104 mEq/L (ref 96–112)
GFR calc Af Amer: >90 mL/min (ref >90)
GFR calc non Af Amer: >90 mL/min (ref >90)
Potassium: 3 mEq/L — ABNORMAL LOW (ref 3.5–5.1)
Sodium: 138 mEq/L (ref 135–145)

## 2012-04-27 LAB — CBC WITH DIFFERENTIAL/PLATELET
Basophils Absolute: 0.2 10*3/uL — ABNORMAL HIGH (ref 0.0–0.1)
Basophils Relative: 1 % (ref 0–1)
Hemoglobin: 8.7 g/dL — ABNORMAL LOW (ref 13.0–17.0)
MCHC: 33.9 g/dL (ref 30.0–36.0)
Monocytes Relative: 11 % (ref 3–12)
Neutro Abs: 13 10*3/uL — ABNORMAL HIGH (ref 1.7–7.7)
Neutrophils Relative %: 74 % (ref 43–77)
Platelets: 551 10*3/uL — ABNORMAL HIGH (ref 150–400)
RDW: 19.3 % — ABNORMAL HIGH (ref 11.5–15.5)

## 2012-04-27 LAB — HEPATIC FUNCTION PANEL
ALT: 16 U/L (ref 0–53)
AST: 26 U/L (ref 0–37)
Bilirubin, Direct: 0.2 mg/dL (ref 0.0–0.3)
Total Bilirubin: 4.6 mg/dL — ABNORMAL HIGH (ref 0.3–1.2)

## 2012-04-27 LAB — URINALYSIS, ROUTINE W REFLEX MICROSCOPIC
Leukocytes, UA: NEGATIVE
Nitrite: NEGATIVE
Specific Gravity, Urine: 1.014 (ref 1.005–1.030)
Urobilinogen, UA: 4 mg/dL — ABNORMAL HIGH (ref 0.0–1.0)
pH: 6.5 (ref 5.0–8.0)

## 2012-04-27 LAB — RETICULOCYTES
RBC.: 2.77 MIL/uL — ABNORMAL LOW (ref 4.22–5.81)
Retic Count, Absolute: 279.8 10*3/uL — ABNORMAL HIGH (ref 19.0–186.0)

## 2012-04-27 MED ORDER — SODIUM CHLORIDE 0.9 % IJ SOLN: 9.0000 mL | INTRAMUSCULAR | Status: DC | PRN

## 2012-04-27 MED ORDER — WARFARIN SODIUM 7.5 MG PO TABS
7.5000 mg | ORAL_TABLET | Freq: Once | ORAL | Status: AC
  Administered 2012-04-27: 7.5 mg via ORAL
  Filled 2012-04-27: qty 1

## 2012-04-27 MED ORDER — BISACODYL 5 MG PO TBEC: 5.0000 mg | DELAYED_RELEASE_TABLET | Freq: Every day | ORAL | Status: DC | PRN

## 2012-04-27 MED ORDER — OLANZAPINE 10 MG PO TABS
10.0000 mg | ORAL_TABLET | Freq: Every day | ORAL | Status: DC
  Administered 2012-04-27 – 2012-05-01 (×5): 10 mg via ORAL
  Filled 2012-04-27 (×6): qty 1

## 2012-04-27 MED ORDER — LISINOPRIL 10 MG PO TABS
10.0000 mg | ORAL_TABLET | Freq: Every day | ORAL | Status: DC
  Administered 2012-04-27 – 2012-05-01 (×5): 10 mg via ORAL
  Filled 2012-04-27 (×6): qty 1

## 2012-04-27 MED ORDER — HYDROXYUREA 500 MG PO CAPS
500.0000 mg | ORAL_CAPSULE | Freq: Two times a day (BID) | ORAL | Status: DC
  Administered 2012-04-27 – 2012-05-02 (×11): 500 mg via ORAL
  Filled 2012-04-27 (×12): qty 1

## 2012-04-27 MED ORDER — ONDANSETRON HCL 4 MG PO TABS: 4.0000 mg | ORAL_TABLET | ORAL | Status: DC | PRN

## 2012-04-27 MED ORDER — HYDROMORPHONE HCL PF 2 MG/ML IJ SOLN
2.0000 mg | INTRAMUSCULAR | Status: DC | PRN
  Administered 2012-04-27 (×6): 4 mg via INTRAVENOUS
  Administered 2012-04-28: 2 mg via INTRAVENOUS
  Administered 2012-04-28 – 2012-04-30 (×27): 4 mg via INTRAVENOUS
  Filled 2012-04-27 (×33): qty 2

## 2012-04-27 MED ORDER — DIPHENHYDRAMINE HCL 25 MG PO CAPS
25.0000 mg | ORAL_CAPSULE | ORAL | Status: DC | PRN
  Administered 2012-04-29: 50 mg via ORAL
  Administered 2012-04-29: 25 mg via ORAL
  Filled 2012-04-27: qty 2
  Filled 2012-04-27: qty 1

## 2012-04-27 MED ORDER — ONDANSETRON HCL 4 MG/2ML IJ SOLN: 4.0000 mg | INTRAMUSCULAR | Status: DC | PRN

## 2012-04-27 MED ORDER — POTASSIUM CHLORIDE CRYS ER 20 MEQ PO TBCR
40.0000 meq | EXTENDED_RELEASE_TABLET | Freq: Once | ORAL | Status: AC
  Administered 2012-04-27: 40 meq via ORAL
  Filled 2012-04-27: qty 2

## 2012-04-27 MED ORDER — IBUPROFEN 800 MG PO TABS: 800.0000 mg | ORAL_TABLET | Freq: Three times a day (TID) | ORAL | Status: DC | PRN

## 2012-04-27 MED ORDER — NALOXONE HCL 0.4 MG/ML IJ SOLN: 0.4000 mg | INTRAMUSCULAR | Status: DC | PRN

## 2012-04-27 MED ORDER — HYDROMORPHONE HCL PF 2 MG/ML IJ SOLN
4.0000 mg | INTRAMUSCULAR | Status: AC
  Administered 2012-04-27 (×3): 4 mg via INTRAVENOUS
  Filled 2012-04-27 (×2): qty 2
  Filled 2012-04-27 (×2): qty 1

## 2012-04-27 MED ORDER — HYDROMORPHONE HCL PF 2 MG/ML IJ SOLN
2.0000 mg | Freq: Once | INTRAMUSCULAR | Status: AC
  Administered 2012-04-27: 2 mg via INTRAVENOUS
  Filled 2012-04-27: qty 1

## 2012-04-27 MED ORDER — ONDANSETRON HCL 4 MG/2ML IJ SOLN
4.0000 mg | Freq: Once | INTRAMUSCULAR | Status: AC
  Administered 2012-04-27: 4 mg via INTRAVENOUS
  Filled 2012-04-27: qty 2

## 2012-04-27 MED ORDER — SENNA 8.6 MG PO TABS
1.0000 | ORAL_TABLET | Freq: Two times a day (BID) | ORAL | Status: DC
  Administered 2012-04-27 – 2012-05-02 (×9): 8.6 mg via ORAL
  Filled 2012-04-27 (×11): qty 1

## 2012-04-27 MED ORDER — FOLIC ACID 1 MG PO TABS
1.0000 mg | ORAL_TABLET | Freq: Every morning | ORAL | Status: DC
  Administered 2012-04-27 – 2012-05-02 (×6): 1 mg via ORAL
  Filled 2012-04-27 (×6): qty 1

## 2012-04-27 MED ORDER — POTASSIUM CHLORIDE CRYS ER 20 MEQ PO TBCR
20.0000 meq | EXTENDED_RELEASE_TABLET | Freq: Two times a day (BID) | ORAL | Status: DC
  Administered 2012-04-27 – 2012-04-30 (×7): 20 meq via ORAL
  Filled 2012-04-27 (×9): qty 1

## 2012-04-27 MED ORDER — DOCUSATE SODIUM 100 MG PO CAPS
100.0000 mg | ORAL_CAPSULE | Freq: Two times a day (BID) | ORAL | Status: DC
  Administered 2012-04-27 – 2012-05-01 (×8): 100 mg via ORAL
  Filled 2012-04-27 (×12): qty 1

## 2012-04-27 MED ORDER — SODIUM CHLORIDE 0.9 % IV BOLUS (SEPSIS)
1000.0000 mL | Freq: Once | INTRAVENOUS | Status: AC
  Administered 2012-04-27: 1000 mL via INTRAVENOUS

## 2012-04-27 MED ORDER — DIPHENHYDRAMINE HCL 50 MG/ML IJ SOLN
12.5000 mg | INTRAMUSCULAR | Status: DC | PRN
  Administered 2012-04-28 – 2012-04-29 (×6): 25 mg via INTRAVENOUS
  Administered 2012-04-29: 12.5 mg via INTRAVENOUS
  Filled 2012-04-27 (×7): qty 1

## 2012-04-27 MED ORDER — POTASSIUM CHLORIDE IN NACL 20-0.45 MEQ/L-% IV SOLN
INTRAVENOUS | Status: AC
  Administered 2012-04-27 (×2): via INTRAVENOUS
  Filled 2012-04-27 (×3): qty 1000

## 2012-04-27 MED ORDER — HYDROMORPHONE 0.3 MG/ML IV SOLN
INTRAVENOUS | Status: DC
  Administered 2012-04-27: 18:00:00 via INTRAVENOUS
  Administered 2012-04-27: 3.9 mg via INTRAVENOUS
  Administered 2012-04-27: via INTRAVENOUS
  Administered 2012-04-28: 15.8 mg via INTRAVENOUS
  Administered 2012-04-28: 5.7 mg via INTRAVENOUS
  Administered 2012-04-28: 3.3 mg via INTRAVENOUS
  Administered 2012-04-28: 5.32 mg via INTRAVENOUS
  Administered 2012-04-28: 2.7 mg via INTRAVENOUS
  Administered 2012-04-28: 7.91 mg via INTRAVENOUS
  Administered 2012-04-28: 2.36 mg via INTRAVENOUS
  Administered 2012-04-29: 3 mg via INTRAVENOUS
  Administered 2012-04-29: 5.39 mg via INTRAVENOUS
  Administered 2012-04-29: 2.4 mg via INTRAVENOUS
  Administered 2012-04-29: 7.5 mg via INTRAVENOUS
  Administered 2012-04-29: 12:00:00 via INTRAVENOUS
  Administered 2012-04-29: 4.2 mg via INTRAVENOUS
  Administered 2012-04-30: 2.4 mg via INTRAVENOUS
  Administered 2012-04-30: 14 mL via INTRAVENOUS
  Administered 2012-04-30: 10 mL via INTRAVENOUS
  Administered 2012-04-30: 4.5 mg via INTRAVENOUS
  Filled 2012-04-27 (×9): qty 25

## 2012-04-27 MED ORDER — WARFARIN SODIUM 5 MG PO TABS: 5.0000 mg | ORAL_TABLET | Freq: Every evening | ORAL | Status: DC

## 2012-04-27 MED ORDER — WARFARIN - PHARMACIST DOSING INPATIENT: Freq: Every day | Status: DC

## 2012-04-27 MED ORDER — ENOXAPARIN SODIUM 80 MG/0.8ML ~~LOC~~ SOLN
80.0000 mg | Freq: Two times a day (BID) | SUBCUTANEOUS | Status: DC
  Administered 2012-04-27 – 2012-04-28 (×3): 80 mg via SUBCUTANEOUS
  Filled 2012-04-27 (×5): qty 0.8

## 2012-04-27 MED ORDER — HYDROMORPHONE HCL PF 2 MG/ML IJ SOLN
4.0000 mg | Freq: Once | INTRAMUSCULAR | Status: AC
  Administered 2012-04-27: 4 mg via INTRAVENOUS
  Filled 2012-04-27: qty 2

## 2012-04-27 NOTE — ED Provider Notes (Signed)
History     CSN: [id number]  Arrival date & time 04/27/12  0865   First MD Initiated Contact with Patient 04/27/12 0354      Chief Complaint  Patient presents with  . Sickle Cell Pain Crisis    (Consider location/radiation/quality/duration/timing/severity/associated sxs/prior treatment) HPI Comments: Patient history of sickle cell disease presents with pain characteristic of a sickle cell crises. He states he woke up tonight with the pain. He has pain in his chest, back and lower legs. He states that this is where he typically has his pain in his typical crisis for him. He denies any shortness of breath. He denies any fevers or chills. He denies any cough or chest congestion. He was recently admitted to the sickle cell clinic 2 days ago for the same. He took his pain medication at home without relief.  Patient is a 32 y.o. male presenting with sickle cell pain.  Sickle Cell Pain Crisis  Associated symptoms include nausea and back pain. Pertinent negatives include no chest pain, no abdominal pain, no diarrhea, no vomiting, no hematuria, no congestion, no headaches, no rhinorrhea, no weakness, no cough and no rash.    Past Medical History  Diagnosis Date  . Sickle cell anemia   . Blood transfusion   . 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 Hypokalemia   . Mood disorder   . Pulmonary embolism   . Avascular necrosis   . Leukocytosis     Chronic  . Thrombocytosis     Chronic  . Hypertension     Past Surgical History  Procedure Laterality Date  . Right hip replacement      08/2006  . Cholecystectomy      01/2008  . Porta cath placement    . Porta cath removal    . Umbilical hernia repair      01/2008  . Excision of left periauricular cyst      10/2009  . Excision of right ear lobe cyst with primary closur      11/2007  . Portacath placement  01/05/2012    Procedure: INSERTION PORT-A-CATH;  Surgeon: Adolph Pollack, MD;  Location:  [facility] OR;   Service: General;  Laterality: N/A;  ultrasound guiced port a cath insertion with fluoroscopy    Family History  Problem Relation Age of Onset  . Sickle cell anemia Mother   . Sickle cell anemia Father   . Sickle cell trait Brother     History  Substance Use Topics  . Smoking status: Former Smoker -- 13 years    Types: Cigarettes    Quit date: 07/08/2010  . Smokeless tobacco: Never Used  . Alcohol Use: No      Review of Systems  Constitutional: Negative for fever, chills, diaphoresis and fatigue.  HENT: Positive for sneezing. Negative for congestion and rhinorrhea.   Eyes: Negative.   Respiratory: Negative for cough, chest tightness and shortness of breath.   Cardiovascular: Negative for chest pain and leg swelling.  Gastrointestinal: Positive for nausea. Negative for vomiting, abdominal pain, diarrhea and blood in stool.  Genitourinary: Negative for frequency, hematuria, flank pain and difficulty urinating.  Musculoskeletal: Positive for back pain and arthralgias.  Skin: Negative for rash.  Neurological: Negative for dizziness, speech difficulty, weakness, numbness and headaches.    Allergies  Morphine and related  Home Medications   Current Outpatient Rx  Name  Route  Sig  Dispense  Refill  . folic acid (FOLVITE) 1 MG tablet  Oral   Take 1 mg by mouth every morning.         Marland Kitchen HYDROmorphone (DILAUDID) 4 MG tablet   Oral   Take 1-2 tablets (4-8 mg total) by mouth every 4 (four) hours as needed for pain. For pain.   60 tablet   0   . hydroxyurea (HYDREA) 500 MG capsule   Oral   Take 1 capsule (500 mg total) by mouth 2 (two) times daily. May take with food to minimize GI side effects.   60 capsule   2   . lisinopril (PRINIVIL,ZESTRIL) 10 MG tablet   Oral   Take 1 tablet (10 mg total) by mouth daily at 10 pm.   30 tablet   2   . OLANZapine (ZYPREXA) 10 MG tablet   Oral   Take 10 mg by mouth at bedtime.         Marland Kitchen oxyCODONE (OXYCONTIN) 80 MG 12 hr  tablet   Oral   Take 1 tablet (80 mg total) by mouth every 12 (twelve) hours.   60 tablet   0   . potassium chloride SA (K-DUR,KLOR-CON) 20 MEQ tablet   Oral   Take 1 tablet (20 mEq total) by mouth 2 (two) times daily.   60 tablet   1   . warfarin (COUMADIN) 5 MG tablet   Oral   Take 5-7.5 mg by mouth every evening. Takes 5 mg daily except Monday Wednesday and Friday he takes 7.5 mg         . zolpidem (AMBIEN) 10 MG tablet   Oral   Take 1 tablet (10 mg total) by mouth at bedtime as needed. For sleep   30 tablet   0   . ibuprofen (ADVIL,MOTRIN) 800 MG tablet   Oral   Take 1 tablet (800 mg total) by mouth every 8 (eight) hours as needed for pain. Do not exceed use for more than 20/30 days   30 tablet   0     BP 123/82  Pulse 66  Temp(Src) 98.5 F (36.9 C) (Oral)  Resp 22  SpO2 99%  Physical Exam  Constitutional: He is oriented to person, place, and time. He appears well-developed and well-nourished.  HENT:  Head: Normocephalic and atraumatic.  Mouth/Throat: Oropharynx is clear and moist.  Eyes: Pupils are equal, round, and reactive to light.  Neck: Normal range of motion. Neck supple.  Cardiovascular: Normal rate, regular rhythm and normal heart sounds.   Pulmonary/Chest: Effort normal and breath sounds normal. No respiratory distress. He has no wheezes. He has no rales. He exhibits no tenderness.  Abdominal: Soft. Bowel sounds are normal. There is no tenderness. There is no rebound and no guarding.  Musculoskeletal: Normal range of motion. He exhibits no edema.  Lymphadenopathy:    He has no cervical adenopathy.  Neurological: He is alert and oriented to person, place, and time.  Skin: Skin is warm and dry. No rash noted.  Psychiatric: He has a normal mood and affect.    ED Course  Procedures (including critical care time)  Results for orders placed during the hospital encounter of 04/27/12  CBC WITH DIFFERENTIAL      Result Value Range   WBC 17.6 (*) 4.0  - 10.5 K/uL   RBC 2.77 (*) 4.22 - 5.81 MIL/uL   Hemoglobin 8.7 (*) 13.0 - 17.0 g/dL   HCT 16.1 (*) 09.6 - 04.5 %   MCV 92.8  78.0 - 100.0 fL   MCH 31.4  26.0 - 34.0 pg   MCHC 33.9  30.0 - 36.0 g/dL   RDW 16.1 (*) 09.6 - 04.5 %   Platelets 551 (*) 150 - 400 K/uL   Neutrophils Relative 74  43 - 77 %   Neutro Abs 13.0 (*) 1.7 - 7.7 K/uL   Lymphocytes Relative 11 (*) 12 - 46 %   Lymphs Abs 1.9  0.7 - 4.0 K/uL   Monocytes Relative 11  3 - 12 %   Monocytes Absolute 2.0 (*) 0.1 - 1.0 K/uL   Eosinophils Relative 3  0 - 5 %   Eosinophils Absolute 0.5  0.0 - 0.7 K/uL   Basophils Relative 1  0 - 1 %   Basophils Absolute 0.2 (*) 0.0 - 0.1 K/uL  BASIC METABOLIC PANEL      Result Value Range   Sodium 138  135 - 145 mEq/L   Potassium 3.0 (*) 3.5 - 5.1 mEq/L   Chloride 104  96 - 112 mEq/L   CO2 22  19 - 32 mEq/L   Glucose, Bld 104 (*) 70 - 99 mg/dL   BUN 4 (*) 6 - 23 mg/dL   Creatinine, Ser 4.09  0.50 - 1.35 mg/dL   Calcium 8.8  8.4 - 81.1 mg/dL   GFR calc non Af Amer >90  >90 mL/min   GFR calc Af Amer >90  >90 mL/min  RETICULOCYTES      Result Value Range   Retic Ct Pct 10.1 (*) 0.4 - 3.1 %   RBC. 2.77 (*) 4.22 - 5.81 MIL/uL   Retic Count, Manual 279.8 (*) 19.0 - 186.0 K/uL  PROTIME-INR      Result Value Range   Prothrombin Time 18.9 (*) 11.6 - 15.2 seconds   INR 1.64 (*) 0.00 - 1.49   Dg Chest 2 View  04/06/2012  *RADIOLOGY REPORT*  Clinical Data: Chest pain.  Sickle cell crisis.  CHEST - 2 VIEW  Comparison: Radiographs 03/28/2012.  CT 03/01/2012.  Findings: The left subclavian power port is unchanged in the lower SVC.  Heart size and mediastinal contours are stable.  Chronic central airway thickening at both lung bases appears stable.  There is no superimposed airspace disease, pleural effusion or pneumothorax.  IMPRESSION: Stable chronic lung disease.  No acute cardiopulmonary process identified.   Original Report Authenticated By: Carey Bullocks, M.D.    Dg Chest 2 View  03/28/2012   *RADIOLOGY REPORT*  Clinical Data: Dyspnea on exertion  CHEST - 2 VIEW  Comparison: 03/17/2012  Findings: Left chest wall porta-catheter is noted with tip in the projection of the cavoatrial junction.  There is mild cardiac enlargement.  There is no pleural effusion or edema.  No airspace consolidation noted.  IMPRESSION:  1.  No acute cardiopulmonary abnormalities.   Original Report Authenticated By: Signa Kell, M.D.       1. Sickle cell crisis       MDM  Patient history of sickle cell crisis presents with a typical pain crisis.  No fevers or suggestion of infection.  No suggestion of acute chest syndrome.  retic count okay.  hgb at baseline.  Pt with no improvement in symptoms after dilaudid, total of 6mg .  Will consult hospitalist for admission.        Rolan Bucco, MD 04/27/12 [phone]

## 2012-04-27 NOTE — Progress Notes (Signed)
ANTICOAGULATION CONSULT NOTE - Initial Consult  Pharmacy Consult for Warfarin/Lovenox Indication: Hx PE  Allergies  Allergen Reactions  . Morphine And Related Hives and Rash    Pt states he is able to tolerate Dilaudid with no reactions.    Patient Measurements: Height: 6' (182.9 cm) Weight: 168 lb 12.8 oz (76.567 kg) IBW/kg (Calculated) : 77.6   Vital Signs: Temp: 98.3 F (36.8 C) (03/28 0651) Temp src: Oral (03/28 0651) BP: 113/74 mmHg (03/28 0651) Pulse Rate: 60 (03/28 0651)  Labs:  Recent Labs  04/24/12 1825 04/25/12 0935 04/27/12 0400  HGB 8.0*  --  8.7*  HCT 24.0*  --  25.7*  PLT 566*  --  551*  LABPROT  --  16.0* 18.9*  INR  --  1.31 1.64*  CREATININE 0.59  --  0.51    Estimated Creatinine Clearance: 143.6 ml/min (by C-G formula based on Cr of 0.51).   Medical History: Past Medical History  Diagnosis Date  . Sickle cell anemia   . Blood transfusion   . 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 Hypokalemia   . Mood disorder   . Pulmonary embolism   . Avascular necrosis   . Leukocytosis     Chronic  . Thrombocytosis     Chronic  . Hypertension     Medications:  Scheduled:  . docusate sodium  100 mg Oral BID  . folic acid  1 mg Oral q morning - 10a  . [COMPLETED]  HYDROmorphone (DILAUDID) injection  2 mg Intravenous Once  . [COMPLETED]  HYDROmorphone (DILAUDID) injection  4 mg Intravenous Once  .  HYDROmorphone (DILAUDID) injection  4 mg Intravenous Q2H  . hydroxyurea  500 mg Oral BID  . lisinopril  10 mg Oral Q2200  . OLANZapine  10 mg Oral QHS  . [COMPLETED] ondansetron  4 mg Intravenous Once  . potassium chloride SA  20 mEq Oral BID  . [COMPLETED] potassium chloride SA  40 mEq Oral Once  . potassium chloride  40 mEq Oral Once  . senna  1 tablet Oral BID  . [COMPLETED] sodium chloride  1,000 mL Intravenous Once  . [DISCONTINUED] warfarin  5-7.5 mg Oral QPM   Infusions:  . 0.45 % NaCl with KCl 20 mEq / L       Assessment: 32 yo with history of sickle cell disease on chronic coumadin for PE.  MD ordering Lovenox bridge and Coumadin per Rx. Goal of Therapy:  INR 2-3 Anti-Xa level 0.6-1.2 units/ml 4hrs after LMWH dose given    Plan:   Lovenox 80mg  Sq q12h.  Warfarin 7.5mg  x1 today  Daily PT/INR  Education  Lorenza Evangelist 04/27/2012,6:55 AM

## 2012-04-27 NOTE — ED Notes (Signed)
Per EMS: Pt reports waking up to pain. Pt took Dilaudid and Oxycodone with no relief. Pain all over body. Nausea reported. No vomiting. Last attack was last month with hospitalization. Alert and oriented.

## 2012-04-27 NOTE — ED Notes (Signed)
ZOX:[id number]<AV> Expected date:<BR> Expected time:<BR> Means of arrival:<BR> Comments:<BR> EMS/31 yo male with sickle cell crisis/pain

## 2012-04-27 NOTE — Progress Notes (Signed)
SICKLE CELL SERVICE PROGRESS NOTE  [name]:[id number] DOB: 07-11-1980 DOA: 04/27/2012 PCP: Willey Blade, MD  Assessment/Plan: Principal Problem:   Sickle cell pain crisis: Pt 's pain poorly controlled with current regimen. Will change to Dilaudid PCA and bolus for breakthrough.  Continue Toradol. Re-evaluate tomorrow. This will likely need to be continued for 48 hours before attempting to wean.     Leukocytosis: No signs of infection. Likely related to acute VOC. Continue to monitor.    Hypokalemia: Replete orally. Recheck tomorrow.    Thrombocytosis: Pt has a chronic thrombocytosis. I suspect that this is secondary to bone marrow turnover and cell adhesion causing sticky cell syndrome.    Sickle cell anemia: Hb. Stable. He has a mildly elevated bilirubin indicating hemolysis which is likely an ongoing process in this patient.   Chronic anticoagulation secondary to recent PE : Pt continues to have persistently sub-therapeutic  INR when readmitted. Currently being titrated by Pharmacy.      Hypertension: BP well controlled.   Code Status: Full Code Family Communication: None Disposition Plan: Home in 3-4 days  Gerald A.  Pager [phone]. If 7PM-7AM, please contact night-coverage.  04/27/2012, 5:33 PM  LOS: 0 days   Brief narrative: [name] is a 32 y.o. male with history of genotype sickle cell, history of pulmonary embolism on chronic anticoagulation, and hypertension who presents with the above complaints. Patient was recently hospitalized on 04/24/2012 and was discharged on 04/25/2012 for sickle cell crisis. He reports at the time of discharge his pain was under control. He was feeling at his baseline until during the night around midnight he woke up with pain in his ribs, low back, and bilateral upper thighs. He reports that this pain is his usual sickle cell crisis pain. He presented to the ER and he was given hydromorphone with some relief. He still  has persistent pain as a result the hospitalist service was asked to admit the patient further care and management. Patient denies any recent fevers, chills, shortness of breath, headaches or vision changes. He does feel nauseated this morning but has not vomited. Does complain of some abdominal pain from the crisis.      Consultants:  None  Procedures:  None  Antibiotics:  None  HPI/Subjective: Pt states that his pain is still poorly controlled and relief lasting only 1 hour Pain is localized to legs, back and arms.  Objective: Filed Vitals:   04/27/12 0534 04/27/12 0651 04/27/12 0952 04/27/12 1339  BP: 123/82 113/74 109/67 112/73  Pulse: 66 60 68 71  Temp:  98.3 F (36.8 C) 98.5 F (36.9 C) 98 F (36.7 C)  TempSrc:  Oral Oral Oral  Resp: 22 18 16 16   Height:  6' (1.829 m)    Weight:  76.567 kg (168 lb 12.8 oz)    SpO2: 99% 100% 100% 99%   Weight change:   Intake/Output Summary (Last 24 hours) at 04/27/12 1733 Last data filed at 04/27/12 1339  Gross per 24 hour  Intake    480 ml  Output      0 ml  Net    480 ml    General: Alert, awake, oriented x3, in mild distress.  HEENT: [place]/AT PEERL, EOMI, Anicteric. Heart: Regular rate and rhythm, without murmurs, rubs, gallops.  Lungs: Clear to auscultation, no wheezing or rhonchi noted. n Abdomen: Soft, nontender, nondistended, positive bowel sounds, no masses no hepatosplenomegaly noted.  Neuro: No focal neurological deficits noted cranial nerves II through XII  grossly intact.  Musculoskeletal: No warm swelling or erythema around joints, no spinal tenderness noted.   Data Reviewed: Basic Metabolic Panel:  Recent Labs Lab 04/24/12 1825 04/27/12 0400  NA 137 138  K 3.0* 3.0*  CL 103 104  CO2 25 22  GLUCOSE 88 104*  BUN 5* 4*  CREATININE 0.59 0.51  CALCIUM 9.0 8.8  MG  --  1.9   Liver Function Tests:  Recent Labs Lab 04/24/12 1825 04/27/12 0400  AST 26 26  ALT 12 16  ALKPHOS 82 83  BILITOT 4.6*  4.6*  PROT 7.9 7.8  ALBUMIN 3.9 3.9   No results found for this basename: LIPASE, AMYLASE,  in the last 168 hours No results found for this basename: AMMONIA,  in the last 168 hours CBC:  Recent Labs Lab 04/24/12 1825 04/27/12 0400  WBC 19.7* 17.6*  NEUTROABS 11.8* 13.0*  HGB 8.0* 8.7*  HCT 24.0* 25.7*  MCV 93.0 92.8  PLT 566* 551*   Cardiac Enzymes: No results found for this basename: CKTOTAL, CKMB, CKMBINDEX, TROPONINI,  in the last 168 hours BNP (last 3 results)  Recent Labs  06/07/11 0650 07/05/11 0620 07/18/11 1500  PROBNP 7.4 219.1* 13.5   CBG: No results found for this basename: GLUCAP,  in the last 168 hours  Recent Results (from the past 240 hour(s))  MRSA PCR SCREENING     Status: None   Collection Time    04/27/12  6:53 AM      Result Value Range Status   MRSA by PCR NEGATIVE  NEGATIVE Final   Comment:            The GeneXpert MRSA Assay (FDA     approved for NASAL specimens     only), is one component of a     comprehensive MRSA colonization     surveillance program. It is not     intended to diagnose MRSA     infection nor to guide or     monitor treatment for     MRSA infections.     Studies: Dg Chest 2 View  04/06/2012  *RADIOLOGY REPORT*  Clinical Data: Chest pain.  Sickle cell crisis.  CHEST - 2 VIEW  Comparison: Radiographs 03/28/2012.  CT 03/01/2012.  Findings: The left subclavian power port is unchanged in the lower SVC.  Heart size and mediastinal contours are stable.  Chronic central airway thickening at both lung bases appears stable.  There is no superimposed airspace disease, pleural effusion or pneumothorax.  IMPRESSION: Stable chronic lung disease.  No acute cardiopulmonary process identified.   Original Report Authenticated By: Carey Bullocks, M.D.     Scheduled Meds: . docusate sodium  100 mg Oral BID  . enoxaparin (LOVENOX) injection  80 mg Subcutaneous Q12H  . folic acid  1 mg Oral q morning - 10a  . HYDROmorphone PCA 0.3 mg/mL    Intravenous Q4H  . hydroxyurea  500 mg Oral BID  . lisinopril  10 mg Oral Q2200  . OLANZapine  10 mg Oral QHS  . potassium chloride SA  20 mEq Oral BID  . senna  1 tablet Oral BID  . Warfarin - Pharmacist Dosing Inpatient   Does not apply q1800   Continuous Infusions: . 0.45 % NaCl with KCl 20 mEq / L 100 mL/hr at 04/27/12 0800    Time spent 45 minutes.

## 2012-04-27 NOTE — Care Management Note (Signed)
CARE MANAGEMENT NOTE 04/27/2012  Patient:  [name], [name]   Account Number:  [id number]  Date Initiated:  04/27/2012  Documentation initiated by:  Sekou Zuckerman  Subjective/Objective Assessment:   32 yo male admitted with Sickle Cell Pain crisis.     Action/Plan:   Home when stable   Anticipated DC Date:     Anticipated DC Plan:           Choice offered to / List presented to:             Status of service:  Completed, signed off Medicare Important Message given?   (If response is "NO", the following Medicare IM given date fields will be blank) Date Medicare IM given:   Date Additional Medicare IM given:    Discharge Disposition:    Per UR Regulation:  Reviewed for med. necessity/level of care/duration of stay  If discussed at Long Length of Stay Meetings, dates discussed:    Comments:  04/27/12 1116 Jailen Coward,RN,BSN [phone]

## 2012-04-27 NOTE — Plan of Care (Signed)
Problem: Phase I Progression Outcomes Goal: Pain controlled with appropriate interventions Outcome: Progressing On Full Dose Dilaudid PCA

## 2012-04-27 NOTE — H&P (Addendum)
Patient's PCP: [name] [name], MD  Chief Complaint: Bilateral rib pain, low back pain, bilateral upper thigh pain  History of Present Illness: [name] is a 32 y.o. male with history of genotype sickle cell, history of pulmonary embolism on chronic anticoagulation, and hypertension who presents with the above complaints.  Patient was recently hospitalized on 04/24/2012 and was discharged on 04/25/2012 for sickle cell crisis.  He reports at the time of discharge his pain was under control.  He was feeling at his baseline until during the night around midnight he woke up with pain in his ribs, low back, and bilateral upper thighs.  He reports that this pain is his usual sickle cell crisis pain.  He presented to the ER and he was given hydromorphone with some relief.  He still has persistent pain as a result the hospitalist service was asked to admit the patient further care and management.  Patient denies any recent fevers, chills, shortness of breath, headaches or vision changes.  He does feel nauseated this morning but has not vomited.  Does complain of some abdominal pain from the crisis.  Review of Systems: All systems reviewed with the patient and positive as per history of present illness, otherwise all other systems are negative.  Past Medical History  Diagnosis Date  . Sickle cell anemia   . Blood transfusion   . 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 Hypokalemia   . Mood disorder   . Pulmonary embolism   . Avascular necrosis   . Leukocytosis     Chronic  . Thrombocytosis     Chronic  . Hypertension    Past Surgical History  Procedure Laterality Date  . Right hip replacement      08/2006  . Cholecystectomy      01/2008  . Porta cath placement    . Porta cath removal    . Umbilical hernia repair      01/2008  . Excision of left periauricular cyst      10/2009  . Excision of right ear lobe cyst with primary closur      11/2007  . Portacath  placement  01/05/2012    Procedure: INSERTION PORT-A-CATH;  Surgeon: Adolph Pollack, MD;  Location: [facility] OR;  Service: General;  Laterality: N/A;  ultrasound guiced port a cath insertion with fluoroscopy   Family History  Problem Relation Age of Onset  . Sickle cell anemia Mother   . Sickle cell anemia Father   . Sickle cell trait Brother    History   Social History  . Marital Status: Single    Spouse Name: N/A    Number of Children: 0  . Years of Education: 13   Occupational History  . Unemployed    Social History Main Topics  . Smoking status: Former Smoker -- 13 years    Types: Cigarettes    Quit date: 07/08/2010  . Smokeless tobacco: Never Used  . Alcohol Use: No  . Drug Use: No     Comment: quit smoking 2011  . Sexually Active: Yes    Birth Control/ Protection: None   Other Topics Concern  . Not on file   Social History Narrative   Lives in an apartment.  Single.  Lives with girlfriend.  Does not use any assist devices.        [name]:  [phone] Mom, emergency contact   Allergies: Morphine and related  Home Meds: Prior to Admission medications  Medication Sig Start Date End Date Taking? Authorizing Provider  folic acid (FOLVITE) 1 MG tablet Take 1 mg by mouth every morning. 01/04/12  Yes Gwenyth Bender, MD  HYDROmorphone (DILAUDID) 4 MG tablet Take 1-2 tablets (4-8 mg total) by mouth every 4 (four) hours as needed for pain. For pain. 04/14/12  Yes Novlet Adelina Mings, MD  hydroxyurea (HYDREA) 500 MG capsule Take 1 capsule (500 mg total) by mouth 2 (two) times daily. May take with food to minimize GI side effects. 01/04/12  Yes Gwenyth Bender, MD  lisinopril (PRINIVIL,ZESTRIL) 10 MG tablet Take 1 tablet (10 mg total) by mouth daily at 10 pm. 01/04/12  Yes Gwenyth Bender, MD  OLANZapine (ZYPREXA) 10 MG tablet Take 10 mg by mouth at bedtime. 11/08/11  Yes Gwenyth Bender, MD  oxyCODONE (OXYCONTIN) 80 MG 12 hr tablet Take 1 tablet (80 mg total) by mouth every 12 (twelve)  hours. 04/14/12  Yes Novlet Adelina Mings, MD  potassium chloride SA (K-DUR,KLOR-CON) 20 MEQ tablet Take 1 tablet (20 mEq total) by mouth 2 (two) times daily. 01/04/12  Yes Gwenyth Bender, MD  warfarin (COUMADIN) 5 MG tablet Take 5-7.5 mg by mouth every evening. Takes 5 mg daily except Monday Wednesday and Friday he takes 7.5 mg 03/05/12  Yes Altha Harm, MD  zolpidem (AMBIEN) 10 MG tablet Take 1 tablet (10 mg total) by mouth at bedtime as needed. For sleep 04/14/12  Yes Novlet Adelina Mings, MD  ibuprofen (ADVIL,MOTRIN) 800 MG tablet Take 1 tablet (800 mg total) by mouth every 8 (eight) hours as needed for pain. Do not exceed use for more than 20/30 days 01/23/12   Altha Harm, MD    Physical Exam: Blood pressure 123/82, pulse 66, temperature 98.5 F (36.9 C), temperature source Oral, resp. rate 22, SpO2 99.00%. General: Awake, Oriented x3, No acute distress. HEENT: EOMI, Moist mucous membranes Neck: Supple CV: S1 and S2 Lungs: Clear to ascultation bilaterally Abdomen: Soft, Nontender, Nondistended, +bowel sounds. Ext: Good pulses. Trace edema. No clubbing or cyanosis noted. Neuro: Cranial Nerves II-XII grossly intact. Has 5/5 motor strength in upper and lower extremities.   Lab results:  Recent Labs  04/24/12 1825 04/27/12 0400  NA 137 138  K 3.0* 3.0*  CL 103 104  CO2 25 22  GLUCOSE 88 104*  BUN 5* 4*  CREATININE 0.59 0.51  CALCIUM 9.0 8.8    Recent Labs  04/24/12 1825  AST 26  ALT 12  ALKPHOS 82  BILITOT 4.6*  PROT 7.9  ALBUMIN 3.9   No results found for this basename: LIPASE, AMYLASE,  in the last 72 hours  Recent Labs  04/24/12 1825 04/27/12 0400  WBC 19.7* 17.6*  NEUTROABS 11.8* 13.0*  HGB 8.0* 8.7*  HCT 24.0* 25.7*  MCV 93.0 92.8  PLT 566* 551*   No results found for this basename: CKTOTAL, CKMB, CKMBINDEX, TROPONINI,  in the last 72 hours No components found with this basename: POCBNP,  No results found for this basename: DDIMER,  in the last 72  hours No results found for this basename: HGBA1C,  in the last 72 hours No results found for this basename: CHOL, HDL, LDLCALC, TRIG, CHOLHDL, LDLDIRECT,  in the last 72 hours No results found for this basename: TSH, T4TOTAL, FREET3, T3FREE, THYROIDAB,  in the last 72 hours  Recent Labs  04/24/12 1825 04/27/12 0400  RETICCTPCT 12.6* 10.1*   Imaging results:  Dg Chest 2 View  04/06/2012  *  RADIOLOGY REPORT*  Clinical Data: Chest pain.  Sickle cell crisis.  CHEST - 2 VIEW  Comparison: Radiographs 03/28/2012.  CT 03/01/2012.  Findings: The left subclavian power port is unchanged in the lower SVC.  Heart size and mediastinal contours are stable.  Chronic central airway thickening at both lung bases appears stable.  There is no superimposed airspace disease, pleural effusion or pneumothorax.  IMPRESSION: Stable chronic lung disease.  No acute cardiopulmonary process identified.   Original Report Authenticated By: Carey Bullocks, M.D.    Dg Chest 2 View  03/28/2012  *RADIOLOGY REPORT*  Clinical Data: Dyspnea on exertion  CHEST - 2 VIEW  Comparison: 03/17/2012  Findings: Left chest wall porta-catheter is noted with tip in the projection of the cavoatrial junction.  There is mild cardiac enlargement.  There is no pleural effusion or edema.  No airspace consolidation noted.  IMPRESSION:  1.  No acute cardiopulmonary abnormalities.   Original Report Authenticated By: Signa Kell, M.D.    Assessment & Plan by Problem: Sickle cell crisis with pain, acute on chronic Attempt to control his pain with hydromorphone 4 mg every 2 hours scheduled for 3 doses, then every 2 hours 2-4 mg as needed, as previously prescribed on previous hospitalization.  Will hydrate the patient on hypotonic saline.  Continue oxygen.  In the morning consider getting sickle cell team involved for further management.  Continue hydroxyurea.  Anemia Due to sickle cell disease.  Hemoglobin at baseline, do not see the need for transfusion  at this time.  Continue to monitor.  Leukocytosis and thrombocytosis Likely due to sickle cell disease.  Stable.  Not suspecting infectious etiology at this time.  History of pulmonary embolism INR subtherapeutic.  Coumadin dose as per pharmacy.  Administer therapeutic Lovenox dose.  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  Defer IV desferal to sickle cell team.  Constipation Initiated bowel regimen.  Mood disorder Continue home Zyprexa.  Hypokalemia Replace as needed.  Check magnesium in the morning.  Hypertension Continue home antihypertensive medications.  Prophylaxis Therapeutic Lovenox.  CODE STATUS Full code.  Disposition Admit the patient to observation to med-surg.  Time spent on admission, talking to the patient, and coordinating care was: 50 mins.  Sullivan Jacuinde A, MD 04/27/2012, 6:25 AM

## 2012-04-27 NOTE — ED Notes (Addendum)
Pt. Unable to give urine at this time. 

## 2012-04-28 DIAGNOSIS — I10 Essential (primary) hypertension: Secondary | ICD-10-CM

## 2012-04-28 DIAGNOSIS — D473 Essential (hemorrhagic) thrombocythemia: Secondary | ICD-10-CM

## 2012-04-28 LAB — BASIC METABOLIC PANEL
BUN: 4 mg/dL — ABNORMAL LOW (ref 6–23)
CO2: 23 mEq/L (ref 19–32)
Chloride: 107 mEq/L (ref 96–112)
Creatinine, Ser: 0.6 mg/dL (ref 0.50–1.35)

## 2012-04-28 LAB — PROTIME-INR: Prothrombin Time: 21.8 seconds — ABNORMAL HIGH (ref 11.6–15.2)

## 2012-04-28 IMAGING — CR DG SHOULDER 2+V*L*
3 series · 3 of 3 positions shown · non-contrast
Comparison: 01/08/2009

CLINICAL DATA: Sickle cell crisis, pain

LEFT SHOULDER - 2+ VIEW

[w shoulder ap internal left *]
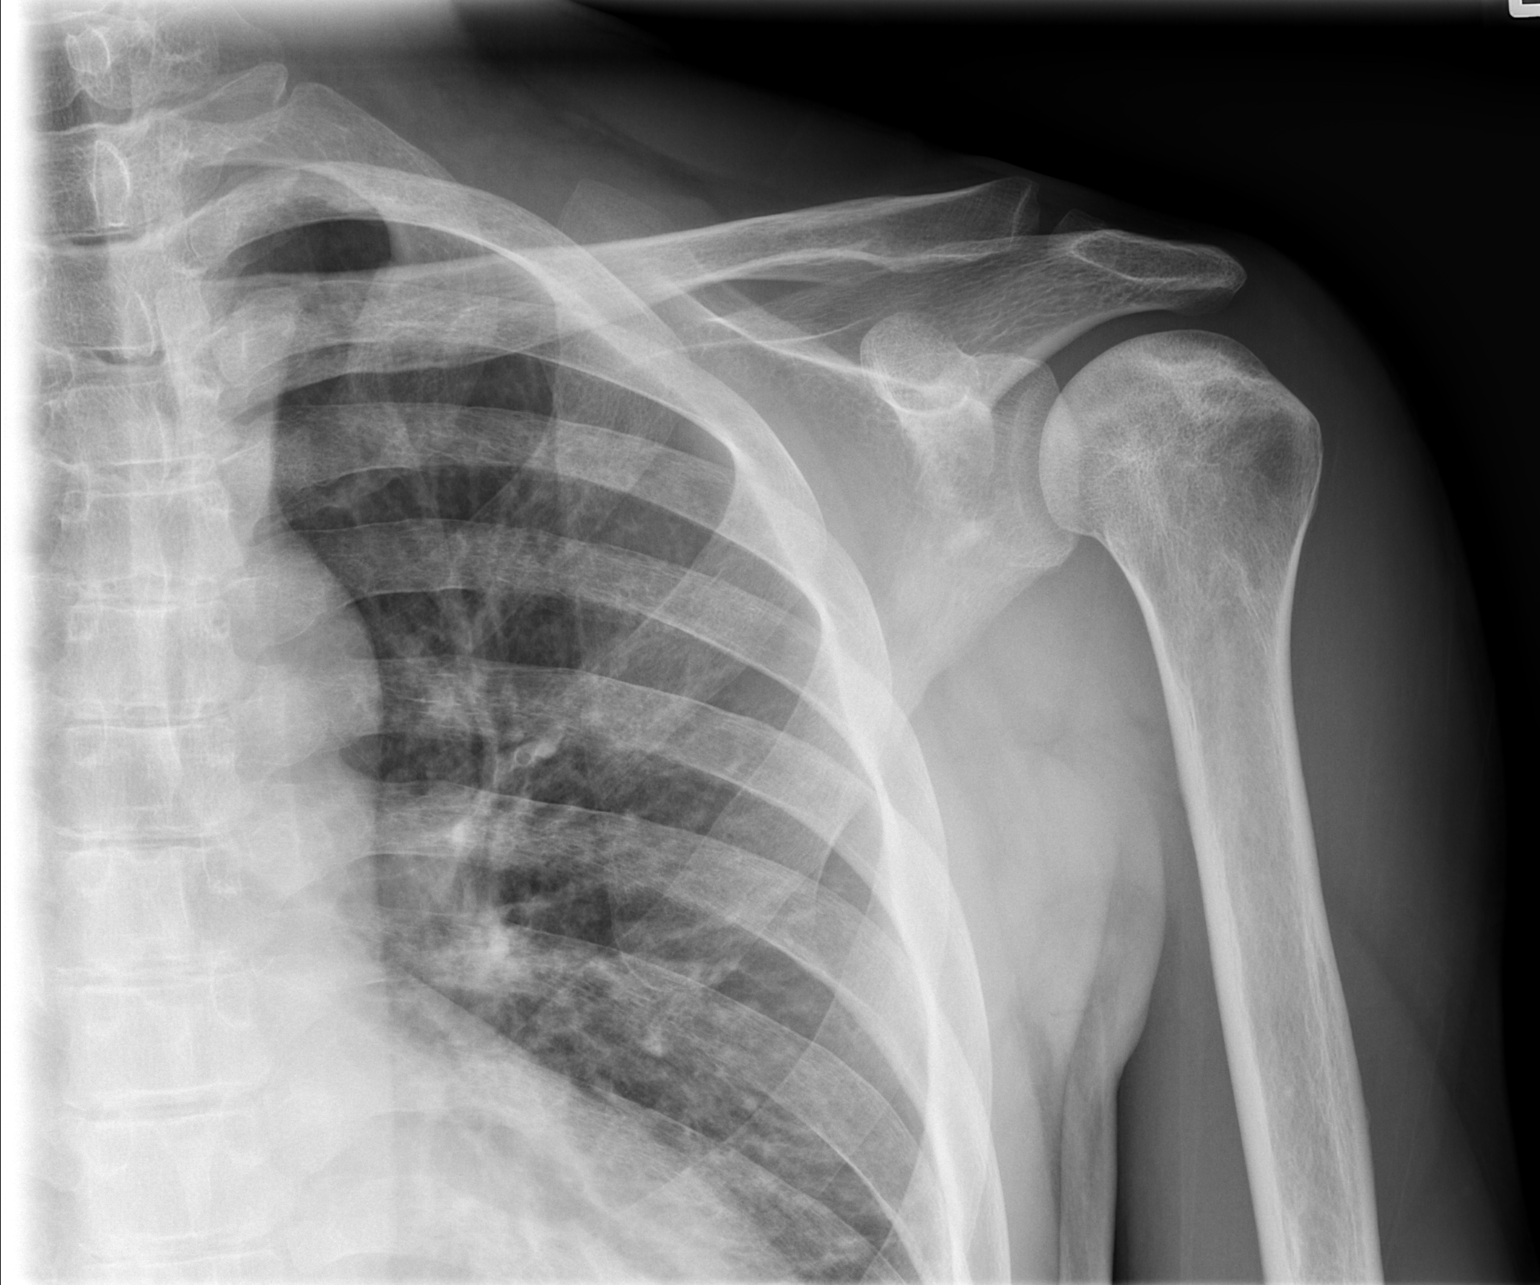

[w shoulder ap external left]
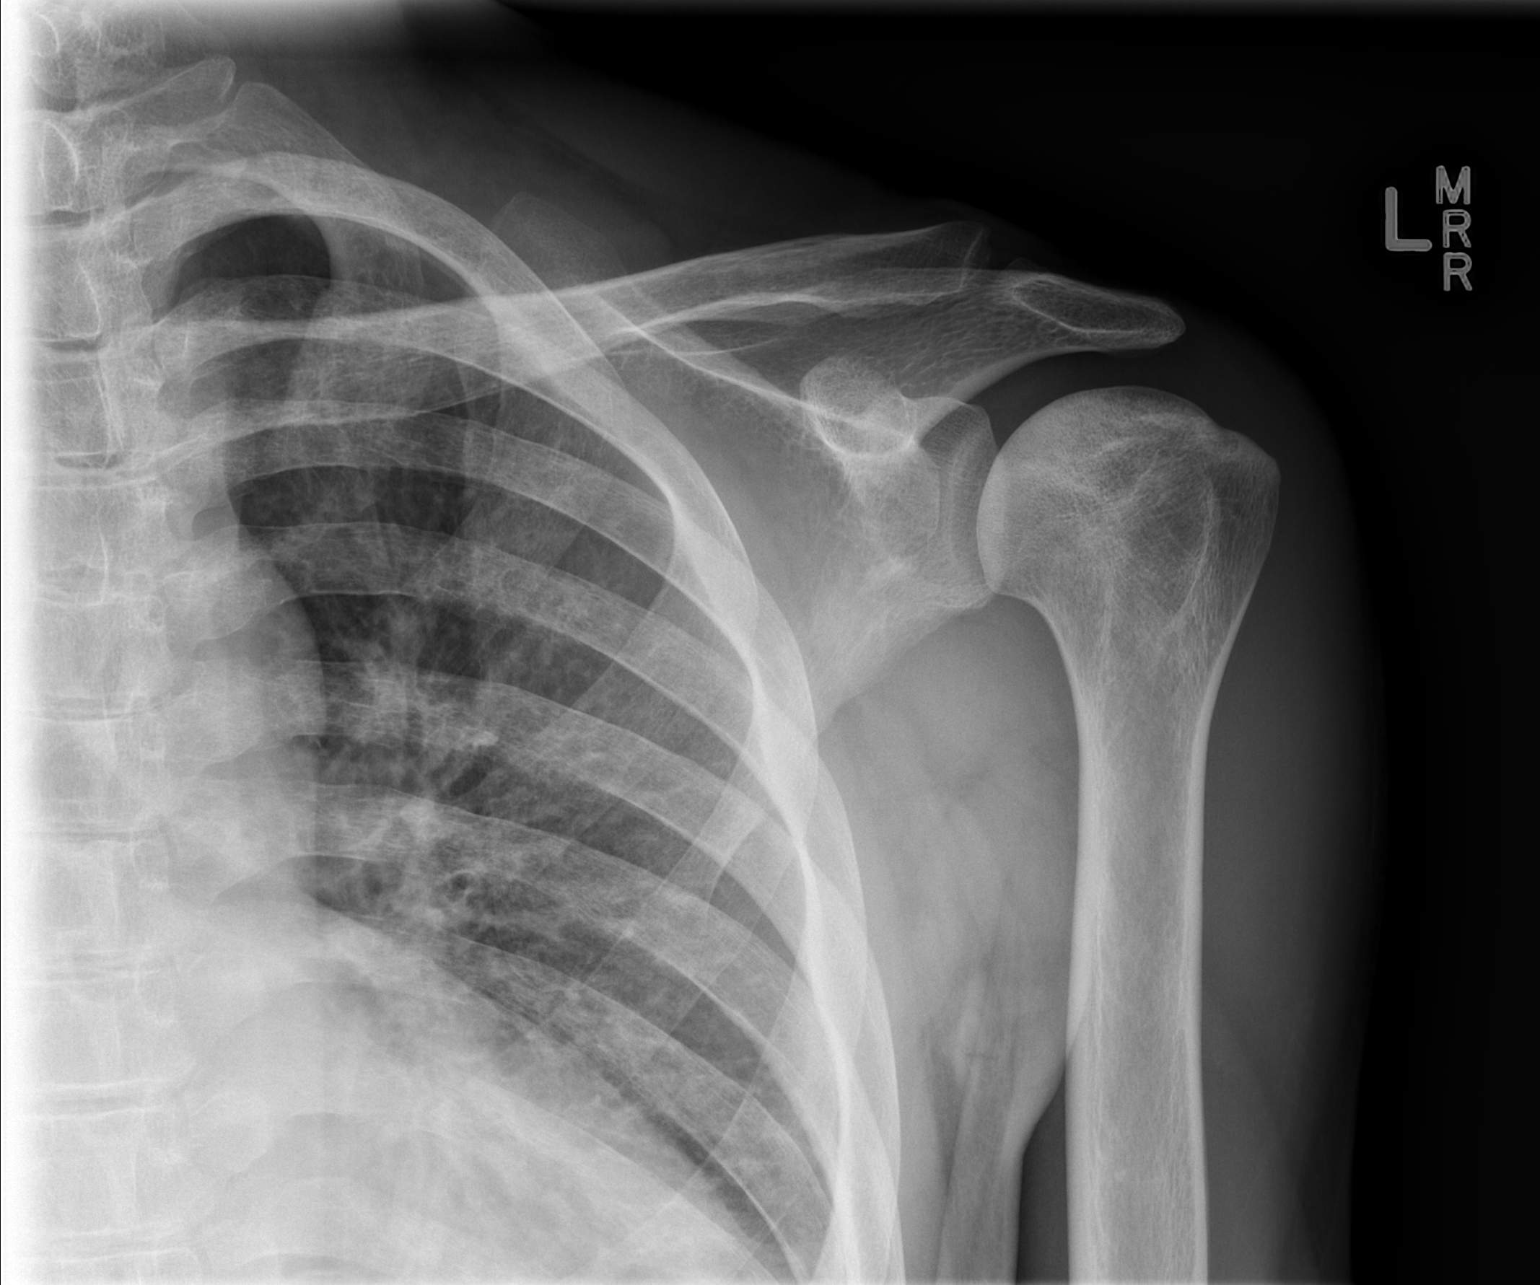

[w shoulder y view left]
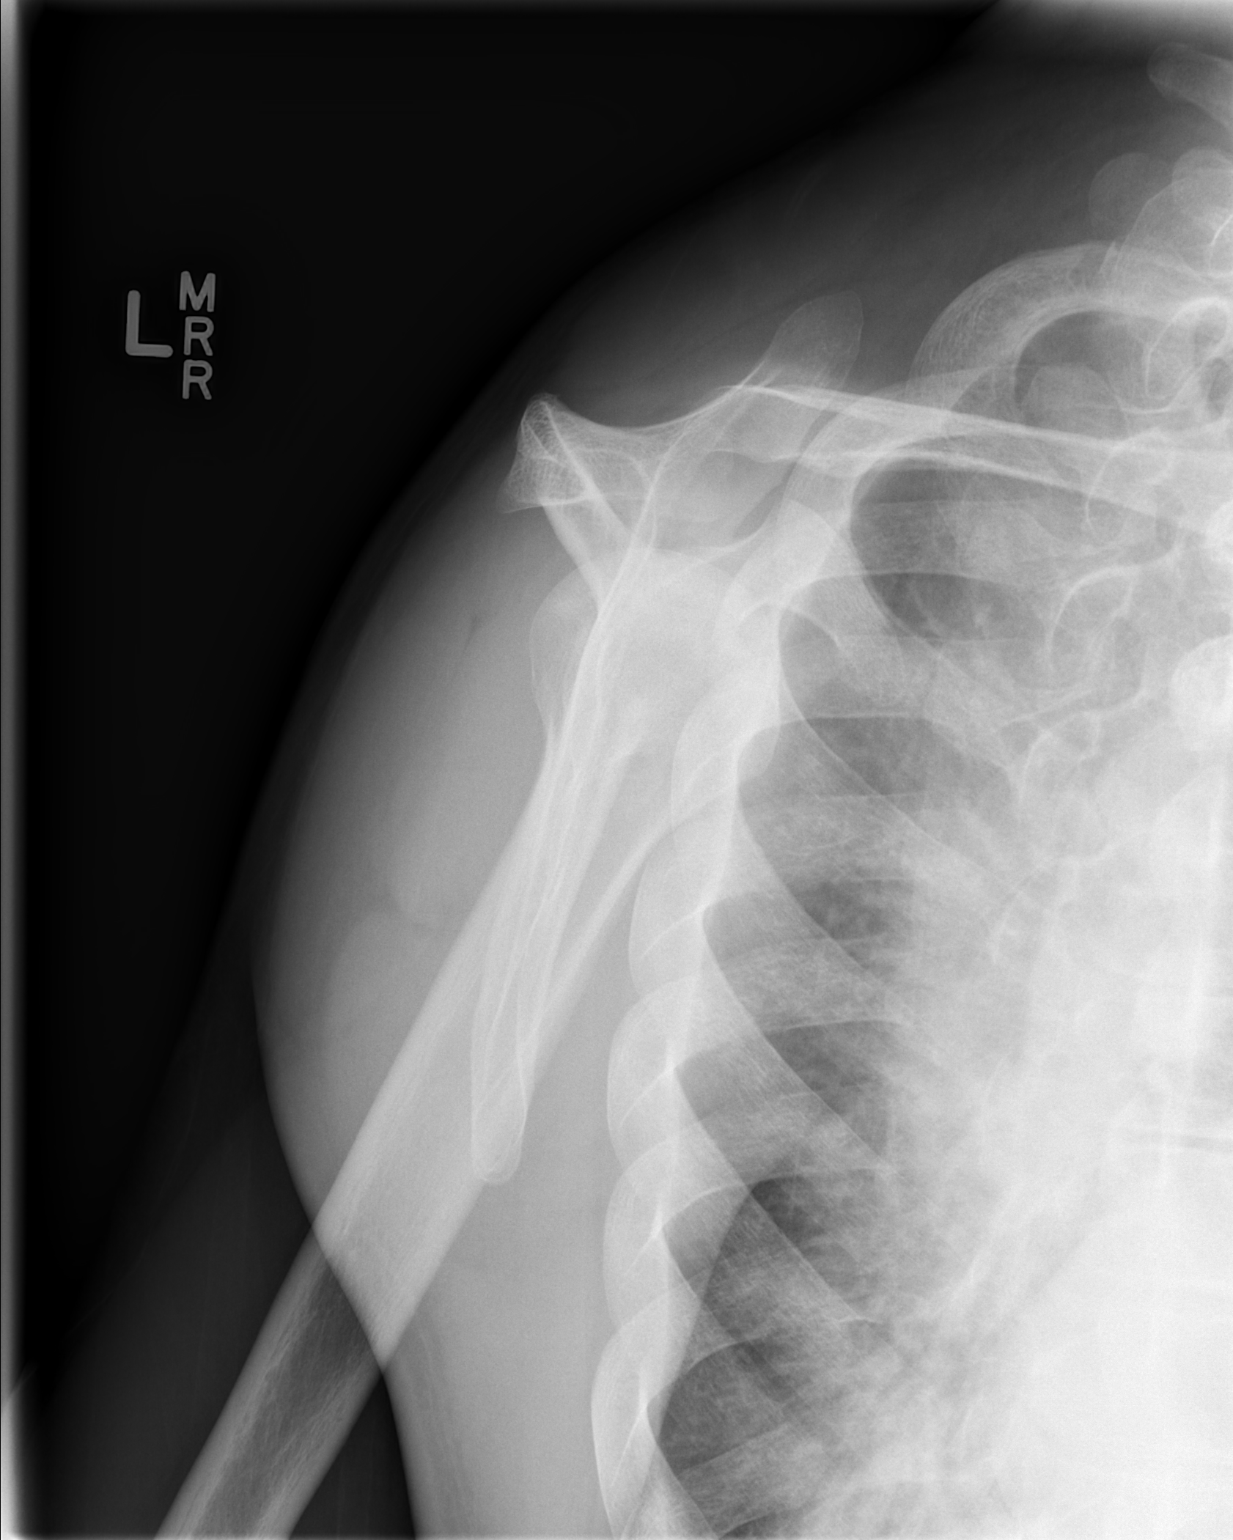

[3 of 3 positions shown; findings below may reference images not displayed]

FINDINGS: Persistent sclerosis in the left humeral head with some
regions of lucency adjacent to the subchondral cortex but no
collapse.  Negative for fracture, dislocation, or other acute bone
injury.
IMPRESSION: 1.  Stable changes of AVN in the left humeral head without acute or
superimposed abnormality.

## 2012-04-28 MED ORDER — WARFARIN SODIUM 5 MG PO TABS
5.0000 mg | ORAL_TABLET | Freq: Once | ORAL | Status: AC
  Administered 2012-04-28: 5 mg via ORAL
  Filled 2012-04-28: qty 1

## 2012-04-28 MED ORDER — SODIUM CHLORIDE 0.45 % IV SOLN
INTRAVENOUS | Status: DC
  Administered 2012-04-28 – 2012-05-01 (×4): via INTRAVENOUS

## 2012-04-28 NOTE — Progress Notes (Signed)
ANTICOAGULATION CONSULT NOTE - Initial Consult  Pharmacy Consult for Warfarin/Lovenox Indication: Hx PE  Allergies  Allergen Reactions  . Morphine And Related Hives and Rash    Pt states he is able to tolerate Dilaudid with no reactions.    Patient Measurements: Height: 6' (182.9 cm) Weight: 172 lb 1.6 oz (78.064 kg) IBW/kg (Calculated) : 77.6   Vital Signs: Temp: 98.3 F (36.8 C) (03/29 0423) Temp src: Oral (03/29 0423) BP: 115/71 mmHg (03/29 0423) Pulse Rate: 72 (03/29 0423)  Labs:  Recent Labs  04/27/12 0400 04/28/12 0445  HGB 8.7*  --   HCT 25.7*  --   PLT 551*  --   LABPROT 18.9* 21.8*  INR 1.64* 1.99*  CREATININE 0.51  --     Estimated Creatinine Clearance: 145.5 ml/min (by C-G formula based on Cr of 0.51).   Medical History: Past Medical History  Diagnosis Date  . Sickle cell anemia   . Blood transfusion   . 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 Hypokalemia   . Mood disorder   . Pulmonary embolism   . Avascular necrosis   . Leukocytosis     Chronic  . Thrombocytosis     Chronic  . Hypertension     Medications:  Scheduled:  . docusate sodium  100 mg Oral BID  . enoxaparin (LOVENOX) injection  80 mg Subcutaneous Q12H  . folic acid  1 mg Oral q morning - 10a  . HYDROmorphone PCA 0.3 mg/mL   Intravenous Q4H  . hydroxyurea  500 mg Oral BID  . lisinopril  10 mg Oral Q2200  . OLANZapine  10 mg Oral QHS  . potassium chloride SA  20 mEq Oral BID  . senna  1 tablet Oral BID  . Warfarin - Pharmacist Dosing Inpatient   Does not apply q1800   Infusions:  . [EXPIRED] 0.45 % NaCl with KCl 20 mEq / L 100 mL/hr at 04/27/12 1927    Assessment:  32 yo with history of sickle cell disease on chronic coumadin for PE.    Dose prior to admission = 5 mg daily except 7.5 mg on MWF.   Lovenox bridging while INR below goal - INR is near goal today (1.99) - Plan to discontinue today and f/u with INR in AM  No CBC today, okay 3/28  and no s/sx bleeding reported   INR 2-3 Anti-Xa level 0.6-1.2 units/ml 4hrs after LMWH dose given    Plan:   Discontinue Lovenox  Warfarin 5 mg x1 today  Daily PT/INR  Laurelle Skiver, Loma Messing PharmD Pager #: [phone] 10:55 AM 04/28/2012

## 2012-04-28 NOTE — Progress Notes (Signed)
SICKLE CELL SERVICE PROGRESS NOTE  [name] DOB: 25-Jun-1980 DOA: 04/27/2012 PCP: [name] [name], MD  Assessment/Plan: Principal Problem: Sickle cell pain crisis:  Continue Dilaudid PCA and bolus for breakthrough pain, continue Toradol. Patient states that this regimen is working well for him.   Leukocytosis: White count was also elevated last admission. No signs of infection. Likely related to acute VOC.  Will monitor.   Hypokalemia: Replete orally. No labs ordered for today. Will check labs today and order repeat for tomorrow. Patient is getting potassium replacement.  Thrombocytosis: Pt has a chronic thrombocytosis. Will monitor platelet.   Sickle cell anemia: Hemoglobin is stable today.  No indication for transfusion.  Chronic anticoagulation secondary to recent PE : Managed per pharmacy. Question of compliance.   Hypertension: BP well controlled. Continue to monitor.   Code Status: Full Code Family Communication: None Disposition Plan: Home in 2-3 days  [name] JARRETT  Pager [phone]. If 7PM-7AM, please contact night-coverage.  04/28/2012, 8:14 AM  LOS: 1 day   Brief narrative: [name] is a 32 y.o. male with history of genotype sickle cell, history of pulmonary embolism on chronic anticoagulation, and hypertension who presents with the above complaints. Patient was recently hospitalized on 04/24/2012 and was discharged on 04/25/2012 for sickle cell crisis. He reports at the time of discharge his pain was under control. He was feeling at his baseline until during the night around midnight he woke up with pain in his ribs, low back, and bilateral upper thighs. He reports that this pain is his usual sickle cell crisis pain. He presented to the ER and he was given hydromorphone with some relief. He still has persistent pain as a result patient was admitted for pain management. .       Consultants:  None  Procedures:  None  Antibiotics:  None  HPI/Subjective: Pt states that his pain is a little better controlled.  He has pain 8/10;  He is hurting in the ribs back and thigh.  He also complains of some itching.     Objective: Filed Vitals:   04/28/12 0138 04/28/12 0400 04/28/12 0423 04/28/12 0644  BP: 111/72  115/71   Pulse: 69  72   Temp: 98.3 F (36.8 C)  98.3 F (36.8 C)   TempSrc: Oral  Oral   Resp: 18 18 18 16   Height:      Weight:   78.064 kg (172 lb 1.6 oz)   SpO2: 99% 99% 100% 99%   Weight change: 1.497 kg (3 lb 4.8 oz)  Intake/Output Summary (Last 24 hours) at 04/28/12 0814 Last data filed at 04/28/12 0424  Gross per 24 hour  Intake    480 ml  Output   1550 ml  Net  -1070 ml    General: Alert, awake, sitting up eating breakfast HEENT:  PEERL, EOMI, Anicteric. Heart: Regular rate and rhythm, no  Murmurs  Rubs or gallops.  Lungs: Clear to auscultation, no wheezing or rhonchi or rales  Abdomen: Soft, nontender, nondistended, positive bowel sounds, no masses no hepatosplenomegaly noted.  Neuro: No focal neurological deficits noted Musculoskeletal: No increase warmth swelling or erythema of the joints.  Data Reviewed: Basic Metabolic Panel:  Recent Labs Lab 04/24/12 1825 04/27/12 0400  NA 137 138  K 3.0* 3.0*  CL 103 104  CO2 25 22  GLUCOSE 88 104*  BUN 5* 4*  CREATININE 0.59 0.51  CALCIUM 9.0 8.8  MG  --  1.9   Liver Function  Tests:  Recent Labs Lab 04/24/12 1825 04/27/12 0400  AST 26 26  ALT 12 16  ALKPHOS 82 83  BILITOT 4.6* 4.6*  PROT 7.9 7.8  ALBUMIN 3.9 3.9    Recent Labs Lab 04/24/12 1825 04/27/12 0400  WBC 19.7* 17.6*  NEUTROABS 11.8* 13.0*  HGB 8.0* 8.7*  HCT 24.0* 25.7*  MCV 93.0 92.8  PLT 566* 551*   Recent Labs  06/07/11 0650 07/05/11 0620 07/18/11 1500  PROBNP 7.4 219.1* 13.5   CBG: No results found for this basename: GLUCAP,  in the last 168 hours  Recent Results (from the  past 240 hour(s))  MRSA PCR SCREENING     Status: None   Collection Time    04/27/12  6:53 AM      Result Value Range Status   MRSA by PCR NEGATIVE  NEGATIVE Final   Comment:            The GeneXpert MRSA Assay (FDA     approved for NASAL specimens     only), is one component of a     comprehensive MRSA colonization     surveillance program. It is not     intended to diagnose MRSA     infection nor to guide or     monitor treatment for     MRSA infections.     Studies: Dg Chest 2 View  04/06/2012  *RADIOLOGY REPORT*  Clinical Data: Chest pain.  Sickle cell crisis.  CHEST - 2 VIEW  Comparison: Radiographs 03/28/2012.  CT 03/01/2012.  Findings: The left subclavian power port is unchanged in the lower SVC.  Heart size and mediastinal contours are stable.  Chronic central airway thickening at both lung bases appears stable.  There is no superimposed airspace disease, pleural effusion or pneumothorax.  IMPRESSION: Stable chronic lung disease.  No acute cardiopulmonary process identified.   Original Report Authenticated By: Carey Bullocks, M.D.     Scheduled Meds: . docusate sodium  100 mg Oral BID  . enoxaparin (LOVENOX) injection  80 mg Subcutaneous Q12H  . folic acid  1 mg Oral q morning - 10a  . HYDROmorphone PCA 0.3 mg/mL   Intravenous Q4H  . hydroxyurea  500 mg Oral BID  . lisinopril  10 mg Oral Q2200  . OLANZapine  10 mg Oral QHS  . potassium chloride SA  20 mEq Oral BID  . senna  1 tablet Oral BID  . Warfarin - Pharmacist Dosing Inpatient   Does not apply q1800   Continuous Infusions:    Time spent 30 minutes.

## 2012-04-28 NOTE — Progress Notes (Signed)
MD, Pt's order for IVF has expired.  News new order.

## 2012-04-29 LAB — BASIC METABOLIC PANEL
BUN: 5 mg/dL — ABNORMAL LOW (ref 6–23)
Chloride: 105 mEq/L (ref 96–112)
Glucose, Bld: 113 mg/dL — ABNORMAL HIGH (ref 70–99)
Potassium: 4 mEq/L (ref 3.5–5.1)

## 2012-04-29 LAB — CBC
HCT: 22 % — ABNORMAL LOW (ref 39.0–52.0)
Hemoglobin: 7.6 g/dL — ABNORMAL LOW (ref 13.0–17.0)
MCH: 31.5 pg (ref 26.0–34.0)
MCHC: 34.5 g/dL (ref 30.0–36.0)
MCV: 91.3 fL (ref 78.0–100.0)

## 2012-04-29 MED ORDER — ENOXAPARIN SODIUM 80 MG/0.8ML ~~LOC~~ SOLN
1.0000 mg/kg | Freq: Two times a day (BID) | SUBCUTANEOUS | Status: DC
  Administered 2012-04-30 – 2012-05-01 (×4): 80 mg via SUBCUTANEOUS
  Filled 2012-04-29 (×8): qty 0.8

## 2012-04-29 MED ORDER — WARFARIN SODIUM 7.5 MG PO TABS
7.5000 mg | ORAL_TABLET | Freq: Once | ORAL | Status: AC
  Administered 2012-04-29: 7.5 mg via ORAL
  Filled 2012-04-29: qty 1

## 2012-04-29 MED ORDER — ENOXAPARIN SODIUM 80 MG/0.8ML ~~LOC~~ SOLN
1.0000 mg/kg | Freq: Once | SUBCUTANEOUS | Status: AC
  Administered 2012-04-29: 80 mg via SUBCUTANEOUS
  Filled 2012-04-29: qty 0.8

## 2012-04-29 NOTE — Progress Notes (Addendum)
ANTICOAGULATION CONSULT NOTE - Initial Consult  Pharmacy Consult for Warfarin/Lovenox Indication: Hx PE  Allergies  Allergen Reactions  . Morphine And Related Hives and Rash    Pt states he is able to tolerate Dilaudid with no reactions.    Patient Measurements: Height: 6' (182.9 cm) Weight: 172 lb 1.6 oz (78.064 kg) IBW/kg (Calculated) : 77.6   Vital Signs: Temp: 98.6 F (37 C) (03/30 1025) Temp src: Oral (03/30 1025) BP: 114/69 mmHg (03/30 1025) Pulse Rate: 86 (03/30 1025)  Labs:  Recent Labs  04/27/12 0400 04/28/12 0445 04/28/12 1745 04/29/12 0345  HGB 8.7*  --   --  7.6*  HCT 25.7*  --   --  22.0*  PLT 551*  --   --  481*  LABPROT 18.9* 21.8*  --  18.6*  INR 1.64* 1.99*  --  1.61*  CREATININE 0.51  --  0.60 0.52    Estimated Creatinine Clearance: 145.5 ml/min (by C-G formula based on Cr of 0.52).   Medical History: Past Medical History  Diagnosis Date  . Sickle cell anemia   . Blood transfusion   . 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 Hypokalemia   . Mood disorder   . Pulmonary embolism   . Avascular necrosis   . Leukocytosis     Chronic  . Thrombocytosis     Chronic  . Hypertension     Medications:  Scheduled:  . docusate sodium  100 mg Oral BID  . folic acid  1 mg Oral q morning - 10a  . HYDROmorphone PCA 0.3 mg/mL   Intravenous Q4H  . hydroxyurea  500 mg Oral BID  . lisinopril  10 mg Oral Q2200  . OLANZapine  10 mg Oral QHS  . potassium chloride SA  20 mEq Oral BID  . senna  1 tablet Oral BID  . [COMPLETED] warfarin  5 mg Oral ONCE-1800  . Warfarin - Pharmacist Dosing Inpatient   Does not apply q1800   Infusions:  . sodium chloride 50 mL/hr at 04/29/12 0700    Assessment:  32 yo with history of sickle cell disease on chronic coumadin for PE.    Dose prior to admission = 5 mg daily except 7.5 mg on MWF.   Hgb 7.6, low secondary to SCD.    **Spoke with patient today and he admits to missing coumadin  on average about 2 times a week** - This makes it difficult to dose/regulate INR.  Unclear if patient actually received warfarin on 3/27.  INR today reflects a missed dose at it has decreased below goal.  Attempted to counsel patient on the importance of compliance with warfarin - recommend reinforcement with the importance of warfarin  Goal INR 2-3 LMWH levels and monitor platelets     Plan:   Warfarin 7.5 mg today at 1800   Daily PT/INR  Will resume lovenox 80 mg Q12h while INR < 2.  Per MD verbal order, Pharmacy to discontinue Lovenox when INR is > 2.   BorgerdingLoma Messing PharmD Pager #: [phone] 12:46 PM 04/29/2012

## 2012-04-29 NOTE — Progress Notes (Signed)
SICKLE CELL SERVICE PROGRESS NOTE  [name]:[id number] DOB: 03/22/1980 DOA: 04/27/2012 PCP: [name] [name]  Assessment/Plan: Principal Problem: Sickle cell pain crisis:  Continue Dilaudid PCA and bolus for breakthrough pain, continue Toradol. Patient states that this regimen is working well for him.  He still rates his pain is still 7/10. He does not feel that  he is close to his baseline. He states that he is still having sharp pain in his ribs.  Will continue this regimen for another 24 hours then start tapering.   Leukocytosis: White count was also elevated last admission. No signs of infection. Likely related to acute VOC.  Will monitor.   Hypokalemia: Replete orally. Resolved  Thrombocytosis: Pt has a chronic thrombocytosis. Will monitor platelet.   Sickle cell anemia: Hemoglobin is stable at 7.6 today.  No indication for transfusion.  Chronic anticoagulation secondary to recent PE : Managed per pharmacy. Question of compliance.   Hypertension: BP well controlled. Continue to monitor.   Code Status: Full Code Family Communication: None Disposition Plan: Home in 1-2 days  [name]  Pager [phone]. If 7PM-7AM, please contact night-coverage.  04/28/2012, 8:14 AM  LOS: 1 day   Brief narrative: [name] is a 32 y.o. male with history of genotype sickle cell, history of pulmonary embolism on chronic anticoagulation, and hypertension who presents with the above complaints. Patient was recently hospitalized on 04/24/2012 and was discharged on 04/25/2012 for sickle cell crisis. He reports at the time of discharge his pain was under control. He was feeling at his baseline until during the night around midnight he woke up with pain in his ribs, low back, and bilateral upper thighs. He reports that this pain is his usual sickle cell crisis pain. He presented to the ER and he was given hydromorphone with some relief. He still has persistent pain as a result  patient was admitted for pain management. .      Consultants:  None  Procedures:  None  Antibiotics:  None  HPI/Subjective: Pt states that today his pain is 7/10. He complains of sharp pain in his ribs.  Objective: Filed Vitals:   04/28/12 0138 04/28/12 0400 04/28/12 0423 04/28/12 0644  BP: 111/72  115/71   Pulse: 69  72   Temp: 98.3 F (36.8 C)  98.3 F (36.8 C)   TempSrc: Oral  Oral   Resp: 18 18 18 16   Height:      Weight:   78.064 kg (172 lb 1.6 oz)   SpO2: 99% 99% 100% 99%   Weight change: 1.497 kg (3 lb 4.8 oz)  Intake/Output Summary (Last 24 hours) at 04/28/12 0814 Last data filed at 04/28/12 0424  Gross per 24 hour  Intake    480 ml  Output   1550 ml  Net  -1070 ml    General: Alert, awake, sitting up eating breakfast HEENT:  PEERL, EOMI, Anicteric. Heart: Regular rate and rhythm, no 1/6 murmur no  Rubs or gallops.  Lungs: Clear to auscultation, no wheezing or rhonchi or rales  Abdomen: Soft, nontender, nondistended, positive bowel sounds, no masses no hepatosplenomegaly noted.  Neuro: No focal neurological deficits noted Musculoskeletal: No increase warmth swelling or erythema of the joints.  Data Reviewed: Basic Metabolic Panel:  Recent Labs Lab 04/24/12 1825 04/27/12 0400  NA 137 138  K 3.0* 3.0*  CL 103 104  CO2 25 22  GLUCOSE 88 104*  BUN 5* 4*  CREATININE 0.59 0.51  CALCIUM 9.0  8.8  MG  --  1.9   Liver Function Tests:  Recent Labs Lab 04/24/12 1825 04/27/12 0400  AST 26 26  ALT 12 16  ALKPHOS 82 83  BILITOT 4.6* 4.6*  PROT 7.9 7.8  ALBUMIN 3.9 3.9    Recent Labs Lab 04/24/12 1825 04/27/12 0400  WBC 19.7* 17.6*  NEUTROABS 11.8* 13.0*  HGB 8.0* 8.7*  HCT 24.0* 25.7*  MCV 93.0 92.8  PLT 566* 551*   Recent Labs  06/07/11 0650 07/05/11 0620 07/18/11 1500  PROBNP 7.4 219.1* 13.5   CBG: No results found for this basename: GLUCAP,  in the last 168 hours  Recent Results (from the past 240 hour(s))  MRSA PCR  SCREENING     Status: None   Collection Time    04/27/12  6:53 AM      Result Value Range Status   MRSA by PCR NEGATIVE  NEGATIVE Final   Comment:            The GeneXpert MRSA Assay (FDA     approved for NASAL specimens     only), is one component of a     comprehensive MRSA colonization     surveillance program. It is not     intended to diagnose MRSA     infection nor to guide or     monitor treatment for     MRSA infections.     Studies: Dg Chest 2 View  04/06/2012  *RADIOLOGY REPORT*  Clinical Data: Chest pain.  Sickle cell crisis.  CHEST - 2 VIEW  Comparison: Radiographs 03/28/2012.  CT 03/01/2012.  Findings: The left subclavian power port is unchanged in the lower SVC.  Heart size and mediastinal contours are stable.  Chronic central airway thickening at both lung bases appears stable.  There is no superimposed airspace disease, pleural effusion or pneumothorax.  IMPRESSION: Stable chronic lung disease.  No acute cardiopulmonary process identified.   Original Report Authenticated By: [name], M.D.     Scheduled Meds: . docusate sodium  100 mg Oral BID  . enoxaparin (LOVENOX) injection  80 mg Subcutaneous Q12H  . folic acid  1 mg Oral q morning - 10a  . HYDROmorphone PCA 0.3 mg/mL   Intravenous Q4H  . hydroxyurea  500 mg Oral BID  . lisinopril  10 mg Oral Q2200  . OLANZapine  10 mg Oral QHS  . potassium chloride SA  20 mEq Oral BID  . senna  1 tablet Oral BID  . Warfarin - Pharmacist Dosing Inpatient   Does not apply q1800   Continuous Infusions:    Time spent 30 minutes.

## 2012-04-30 LAB — CBC
Platelets: 479 10*3/uL — ABNORMAL HIGH (ref 150–400)
RBC: 2.26 MIL/uL — ABNORMAL LOW (ref 4.22–5.81)
WBC: 18.3 10*3/uL — ABNORMAL HIGH (ref 4.0–10.5)

## 2012-04-30 LAB — PROTIME-INR: Prothrombin Time: 17 seconds — ABNORMAL HIGH (ref 11.6–15.2)

## 2012-04-30 MED ORDER — WARFARIN SODIUM 10 MG PO TABS
10.0000 mg | ORAL_TABLET | Freq: Once | ORAL | Status: AC
  Administered 2012-04-30: 10 mg via ORAL
  Filled 2012-04-30: qty 1

## 2012-04-30 MED ORDER — OXYCODONE HCL ER 40 MG PO T12A
80.0000 mg | EXTENDED_RELEASE_TABLET | Freq: Two times a day (BID) | ORAL | Status: DC
  Administered 2012-04-30 – 2012-05-02 (×5): 80 mg via ORAL
  Filled 2012-04-30 (×5): qty 2

## 2012-04-30 MED ORDER — HYDROMORPHONE HCL PF 2 MG/ML IJ SOLN
4.0000 mg | INTRAMUSCULAR | Status: DC | PRN
  Administered 2012-04-30 – 2012-05-02 (×21): 4 mg via INTRAVENOUS
  Filled 2012-04-30 (×21): qty 2

## 2012-04-30 NOTE — Progress Notes (Signed)
ANTICOAGULATION CONSULT NOTE - Follow Up  Pharmacy Consult for Warfarin/Lovenox Indication: Hx PE  Allergies  Allergen Reactions  . Morphine And Related Hives and Rash    Pt states he is able to tolerate Dilaudid with no reactions.    Patient Measurements: Height: 6' (182.9 cm) Weight: 172 lb 1.6 oz (78.064 kg) IBW/kg (Calculated) : 77.6   Vital Signs: Temp: 98 F (36.7 C) (03/31 0613) Temp src: Oral (03/31 0613) BP: 101/61 mmHg (03/31 0613) Pulse Rate: 78 (03/31 0613)  Labs:  Recent Labs  04/28/12 0445 04/28/12 1745 04/29/12 0345 04/30/12 0455  HGB  --   --  7.6* 7.0*  HCT  --   --  22.0* 20.6*  PLT  --   --  481* 479*  LABPROT 21.8*  --  18.6* 17.0*  INR 1.99*  --  1.61* 1.42  CREATININE  --  0.60 0.52  --     Estimated Creatinine Clearance: 145.5 ml/min (by C-G formula based on Cr of 0.52).   Medical History: Past Medical History  Diagnosis Date  . Sickle cell anemia   . Blood transfusion   . 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 Hypokalemia   . Mood disorder   . Pulmonary embolism   . Avascular necrosis   . Leukocytosis     Chronic  . Thrombocytosis     Chronic  . Hypertension     Medications:  Scheduled:  . docusate sodium  100 mg Oral BID  . enoxaparin (LOVENOX) injection  1 mg/kg Subcutaneous Q12H  . [COMPLETED] enoxaparin (LOVENOX) injection  1 mg/kg Subcutaneous Once  . folic acid  1 mg Oral q morning - 10a  . HYDROmorphone PCA 0.3 mg/mL   Intravenous Q4H  . hydroxyurea  500 mg Oral BID  . lisinopril  10 mg Oral Q2200  . OLANZapine  10 mg Oral QHS  . potassium chloride SA  20 mEq Oral BID  . senna  1 tablet Oral BID  . [COMPLETED] warfarin  7.5 mg Oral ONCE-1800  . Warfarin - Pharmacist Dosing Inpatient   Does not apply q1800   Infusions:  . sodium chloride 50 mL/hr at 04/29/12 1550    Assessment:  32 yo with history of sickle cell disease on chronic coumadin for PE   Dose prior to admission = 5 mg  daily except 7.5 mg on MWF  Hgb low secondary to SCD  INR subtherapeutic and dropping  Doses given at [facility] thus far: 10mg , __, 7.5mg , 5mg , 7.5mg    Goal: INR 2-3 LMWH levels and monitor platelets     Plan:   Warfarin 10mg  today due to continued drop in INR  Daily PT/INR  Continue Lovenox 80 mg Q12h while INR < 2.  Per MD verbal order, Pharmacy to discontinue Lovenox when INR is > 2.   Note patient counseled by pharmacist yesterday. Will reinforce education prior to discharge   Hessie Knows, PharmD, BCPS Pager [phone] 04/30/2012 8:23 AM

## 2012-04-30 NOTE — Progress Notes (Signed)
SICKLE CELL SERVICE PROGRESS NOTE  [name] GEX:[id number] DOB: [birth year] DOA: 04/27/2012 PCP: Willey Blade, MD  Assessment/Plan: Principal Problem: Sickle cell pain crisis:  Patient states that his pain was better but now he is having new pain in his left shoulder 7/10 and the pain in his ribs still 7/10.  Discontinued the Dilaudid PCA. Continue MS Contin 80 mg twice a day . Dilaudid 4 mg IV every 2 hours when necessary.    Leukocytosis: White count was also elevated last admission. No signs of infection. Likely related to acute VOC.  Will continue to monitor.   Hypokalemia: Replete orally. Resolved.  Will continue to monitor.  Thrombocytosis: Pt has  chronic thrombocytosis. Will monitor platelet.   Sickle cell anemia: Hemoglobin is stable at 7.0 today.  No indication for transfusion.  Chronic anticoagulation secondary to recent PE : Managed per pharmacy. Question of compliance. Patient is subtherapeutic. Patient is now on Lovenox bridge with the Coumadin.   Hypertension: BP well controlled. Continue to monitor and continue lisinopril.   Code Status: Full Code Family Communication: None Disposition Plan: Home in 1-2 days  [name]  Pager [phone]. If 7PM-7AM, please contact night-coverage.  04/28/2012, 8:14 AM  LOS: 1 day   Brief narrative: [name] is a 32 y.o. male with history of genotype sickle cell, history of pulmonary embolism on chronic anticoagulation, and hypertension who presents with the above complaints. Patient was recently hospitalized on 04/24/2012 and was discharged on 04/25/2012 for sickle cell crisis. He reports at the time of discharge his pain was under control. He was feeling at his baseline until during the night around midnight he woke up with pain in his ribs, low back, and bilateral upper thighs. He reports that this pain is his usual sickle cell crisis pain. He presented to the ER and he was given hydromorphone with some relief.  He still has persistent pain as a result patient was admitted for pain management. .      Consultants:  None  Procedures:  None  Antibiotics:  None  HPI/Subjective: Pt states that today his pain is 7/10. He complains of a new pain in the is a left elbow and sharp pain in his ribs.  Objective: Filed Vitals:   04/28/12 0138 04/28/12 0400 04/28/12 0423 04/28/12 0644  BP: 111/72  115/71   Pulse: 69  72   Temp: 98.3 F (36.8 C)  98.3 F (36.8 C)   TempSrc: Oral  Oral   Resp: 18 18 18 16   Height:      Weight:   78.064 kg (172 lb 1.6 oz)   SpO2: 99% 99% 100% 99%   Weight change: 1.497 kg (3 lb 4.8 oz)  Intake/Output Summary (Last 24 hours) at 04/28/12 0814 Last data filed at 04/28/12 0424  Gross per 24 hour  Intake    480 ml  Output   1550 ml  Net  -1070 ml    General: Alert, awake, sitting up in bed HEENT:  PEERL, EOMI, Anicteric. Heart: Regular rate and rhythm,  1/6 murmur no  Rubs or gallops.  Lungs: Clear to auscultation, no wheezing or rhonchi or rales  Abdomen: Soft, nontender, nondistended, positive bowel sounds, no masses no hepatosplenomegaly noted.  Neuro: No focal neurological deficits noted Musculoskeletal: No increase warmth swelling or erythema of the joints.  Data Reviewed: Basic Metabolic Panel:  Recent Labs Lab 04/24/12 1825 04/27/12 0400  NA 137 138  K 3.0* 3.0*  CL 103 104  CO2 25 22  GLUCOSE 88 104*  BUN 5* 4*  CREATININE 0.59 0.51  CALCIUM 9.0 8.8  MG  --  1.9   Liver Function Tests:  Recent Labs Lab 04/24/12 1825 04/27/12 0400  AST 26 26  ALT 12 16  ALKPHOS 82 83  BILITOT 4.6* 4.6*  PROT 7.9 7.8  ALBUMIN 3.9 3.9    Recent Labs Lab 04/24/12 1825 04/27/12 0400  WBC 19.7* 17.6*  NEUTROABS 11.8* 13.0*  HGB 8.0* 8.7*  HCT 24.0* 25.7*  MCV 93.0 92.8  PLT 566* 551*   Recent Labs  06/07/11 0650 07/05/11 0620 07/18/11 1500  PROBNP 7.4 219.1* 13.5   CBG: No results found for this basename: GLUCAP,  in the last  168 hours  Recent Results (from the past 240 hour(s))  MRSA PCR SCREENING     Status: None   Collection Time    04/27/12  6:53 AM      Result Value Range Status   MRSA by PCR NEGATIVE  NEGATIVE Final   Comment:            The GeneXpert MRSA Assay (FDA     approved for NASAL specimens     only), is one component of a     comprehensive MRSA colonization     surveillance program. It is not     intended to diagnose MRSA     infection nor to guide or     monitor treatment for     MRSA infections.     Studies: Dg Chest 2 View  04/06/2012  *RADIOLOGY REPORT*  Clinical Data: Chest pain.  Sickle cell crisis.  CHEST - 2 VIEW  Comparison: Radiographs 03/28/2012.  CT 03/01/2012.  Findings: The left subclavian power port is unchanged in the lower SVC.  Heart size and mediastinal contours are stable.  Chronic central airway thickening at both lung bases appears stable.  There is no superimposed airspace disease, pleural effusion or pneumothorax.  IMPRESSION: Stable chronic lung disease.  No acute cardiopulmonary process identified.   Original Report Authenticated By: Carey Bullocks, M.D.     Scheduled Meds: . docusate sodium  100 mg Oral BID  . enoxaparin (LOVENOX) injection  80 mg Subcutaneous Q12H  . folic acid  1 mg Oral q morning - 10a  . HYDROmorphone PCA 0.3 mg/mL   Intravenous Q4H  . hydroxyurea  500 mg Oral BID  . lisinopril  10 mg Oral Q2200  . OLANZapine  10 mg Oral QHS  . potassium chloride SA  20 mEq Oral BID  . senna  1 tablet Oral BID  . Warfarin - Pharmacist Dosing Inpatient   Does not apply q1800   Continuous Infusions:    Time spent 30 minutes.

## 2012-05-01 LAB — CBC
Platelets: 488 10*3/uL — ABNORMAL HIGH (ref 150–400)
RBC: 2.15 MIL/uL — ABNORMAL LOW (ref 4.22–5.81)
WBC: 15.2 10*3/uL — ABNORMAL HIGH (ref 4.0–10.5)

## 2012-05-01 LAB — COMPREHENSIVE METABOLIC PANEL
ALT: 37 U/L (ref 0–53)
AST: 51 U/L — ABNORMAL HIGH (ref 0–37)
CO2: 24 mEq/L (ref 19–32)
Calcium: 8.6 mg/dL (ref 8.4–10.5)
Chloride: 106 mEq/L (ref 96–112)
GFR calc non Af Amer: >90 mL/min (ref >90)
Sodium: 139 mEq/L (ref 135–145)
Total Bilirubin: 3.5 mg/dL — ABNORMAL HIGH (ref 0.3–1.2)

## 2012-05-01 LAB — PROTIME-INR: Prothrombin Time: 18.6 seconds — ABNORMAL HIGH (ref 11.6–15.2)

## 2012-05-01 MED ORDER — HYDROMORPHONE HCL 4 MG PO TABS
8.0000 mg | ORAL_TABLET | ORAL | Status: DC
  Administered 2012-05-01 – 2012-05-02 (×7): 8 mg via ORAL
  Filled 2012-05-01 (×7): qty 2

## 2012-05-01 MED ORDER — WARFARIN SODIUM 10 MG PO TABS
10.0000 mg | ORAL_TABLET | Freq: Once | ORAL | Status: AC
  Administered 2012-05-01: 10 mg via ORAL
  Filled 2012-05-01: qty 1

## 2012-05-01 MED ORDER — KETOROLAC TROMETHAMINE 30 MG/ML IJ SOLN
30.0000 mg | Freq: Four times a day (QID) | INTRAMUSCULAR | Status: DC
  Administered 2012-05-01 – 2012-05-02 (×5): 30 mg via INTRAVENOUS
  Filled 2012-05-01 (×9): qty 1

## 2012-05-01 NOTE — Progress Notes (Signed)
HGB 6.8 THIS AM, HGB 7.0 ON 3/31.

## 2012-05-01 NOTE — Progress Notes (Signed)
SICKLE CELL SERVICE PROGRESS NOTE  [name] [name] DOB: 05-20-1980 DOA: 04/27/2012 PCP: Gerald Saucer [name], MD  Assessment/Plan: Principal Problem:   Sickle cell pain crisis: Pt states that his pain is not back to baseline.  I have restarted his short acting medications and will continue IV analgesics on an as needed basis.  His expectations for hospitalization are to be back to baseline instead of functional and manageable with his pain. This expectation will certainly create dissatisfaction. He has a poor understanding of his disease and what he should expect from a hospitalization. Pt has not had a Chronic disease clincic visit in more than 6 months by his admission.     Leukocytosis: No signs of infection. Likely related to acute VOC. Improved.    Hypokalemia: Repleted orally. Potassium now normal.    Thrombocytosis: Pt has a chronic thrombocytosis. I suspect that this is secondary to bone marrow turnover and cell adhesion causing sticky cell syndrome.    Sickle cell anemia: Hb. mildly decreased which I suspect is dilutional. I will discontinue IVF and re check tomorrow.  He has a mildly elevated bilirubin indicating hemolysis which is likely an ongoing process in this patient.   Chronic anticoagulation secondary to recent PE : Pt continues to have persistently sub-therapeutic  INR when readmitted. Currently being titrated by Pharmacy.      Hypertension: BP well controlled.  Psychosocial: Pt has a poor understanding of his disease and expectations. Had a long conversation with [name] and he acknowledges that his disease is becoming more difficult to treat as he ages. He also offers, that he has a lot of repressed frustration and anger at having this disease. He has also declined to attend his clinic appointments as he doesn't think that he will gain anything from doing so. He only wants his pain medications.    Code Status: Full Code Family Communication: None Disposition  Plan: Home in 3-4 days  [name] A.  Pager [phone]. If 7PM-7AM, please contact night-coverage.  05/01/2012, 11:47 AM  LOS: 4 days   Brief narrative: [name] is a 32 y.o. male with history of genotype sickle cell, history of pulmonary embolism on chronic anticoagulation, and hypertension who presents with the above complaints. Patient was recently hospitalized on 04/24/2012 and was discharged on 04/25/2012 for sickle cell crisis. He reports at the time of discharge his pain was under control. He was feeling at his baseline until during the night around midnight he woke up with pain in his ribs, low back, and bilateral upper thighs. He reports that this pain is his usual sickle cell crisis pain. He presented to the ER and he was given hydromorphone with some relief. He still has persistent pain as a result the hospitalist service was asked to admit the patient further care and management. Patient denies any recent fevers, chills, shortness of breath, headaches or vision changes. He does feel nauseated this morning but has not vomited. Does complain of some abdominal pain from the crisis.      Consultants:  None  Procedures:  None  Antibiotics:  None  HPI/Subjective: Pt states that his pain is still poorly controlled and relief lasting only 1 hour Pain is localized to legs, back and arms.  Objective: Filed Vitals:   05/01/12 0224 05/01/12 0500 05/01/12 0556 05/01/12 0922  BP: 112/62 109/72  110/64  Pulse: 92 82  89  Temp: 99 F (37.2 C) 99 F (37.2 C)  99.2 F (37.3 C)  TempSrc: Oral  Oral  Oral  Resp: 18 18  16   Height:      Weight:   77.928 kg (171 lb 12.8 oz)   SpO2: 96% 97%  97%   Weight change:   Intake/Output Summary (Last 24 hours) at 05/01/12 1147 Last data filed at 05/01/12 3664  Gross per 24 hour  Intake   2020 ml  Output   1550 ml  Net    470 ml    General: Alert, awake, oriented x3, in mild distress.  HEENT: [place]/AT PEERL, EOMI,  Anicteric. Heart: Regular rate and rhythm, without murmurs, rubs, gallops.  Lungs: Clear to auscultation, no wheezing or rhonchi noted. n Abdomen: Soft, nontender, nondistended, positive bowel sounds, no masses no hepatosplenomegaly noted.  Neuro: No focal neurological deficits noted cranial nerves II through XII grossly intact.  Musculoskeletal: No warm swelling or erythema around joints, no spinal tenderness noted.   Data Reviewed: Basic Metabolic Panel:  Recent Labs Lab 04/24/12 1825 04/27/12 0400 04/28/12 1745 04/29/12 0345 05/01/12 0530  NA 137 138 136 136 139  K 3.0* 3.0* 4.0 4.0 4.1  CL 103 104 107 105 106  CO2 25 22 23 23 24   GLUCOSE 88 104* 93 113* 108*  BUN 5* 4* 4* 5* 10  CREATININE 0.59 0.51 0.60 0.52 0.61  CALCIUM 9.0 8.8 8.4 8.6 8.6  MG  --  1.9  --   --   --    Liver Function Tests:  Recent Labs Lab 04/24/12 1825 04/27/12 0400 05/01/12 0530  AST 26 26 51*  ALT 12 16 37  ALKPHOS 82 83 106  BILITOT 4.6* 4.6* 3.5*  PROT 7.9 7.8 7.1  ALBUMIN 3.9 3.9 3.6   No results found for this basename: LIPASE, AMYLASE,  in the last 168 hours No results found for this basename: AMMONIA,  in the last 168 hours CBC:  Recent Labs Lab 04/24/12 1825 04/27/12 0400 04/29/12 0345 04/30/12 0455 05/01/12 0530  WBC 19.7* 17.6* 18.5* 18.3* 15.2*  NEUTROABS 11.8* 13.0*  --   --   --   HGB 8.0* 8.7* 7.6* 7.0* 6.8*  HCT 24.0* 25.7* 22.0* 20.6* 19.6*  MCV 93.0 92.8 91.3 91.2 91.2  PLT 566* 551* 481* 479* 488*   Cardiac Enzymes: No results found for this basename: CKTOTAL, CKMB, CKMBINDEX, TROPONINI,  in the last 168 hours BNP (last 3 results)  Recent Labs  06/07/11 0650 07/05/11 0620 07/18/11 1500  PROBNP 7.4 219.1* 13.5   CBG: No results found for this basename: GLUCAP,  in the last 168 hours  Recent Results (from the past 240 hour(s))  MRSA PCR SCREENING     Status: None   Collection Time    04/27/12  6:53 AM      Result Value Range Status   MRSA by PCR  NEGATIVE  NEGATIVE Final   Comment:            The GeneXpert MRSA Assay (FDA     approved for NASAL specimens     only), is one component of a     comprehensive MRSA colonization     surveillance program. It is not     intended to diagnose MRSA     infection nor to guide or     monitor treatment for     MRSA infections.     Studies: Dg Chest 2 View  04/06/2012  *RADIOLOGY REPORT*  Clinical Data: Chest pain.  Sickle cell crisis.  CHEST - 2 VIEW  Comparison: Radiographs 03/28/2012.  CT 03/01/2012.  Findings: The left subclavian power port is unchanged in the lower SVC.  Heart size and mediastinal contours are stable.  Chronic central airway thickening at both lung bases appears stable.  There is no superimposed airspace disease, pleural effusion or pneumothorax.  IMPRESSION: Stable chronic lung disease.  No acute cardiopulmonary process identified.   Original Report Authenticated By: Carey Bullocks, M.D.     Scheduled Meds: . docusate sodium  100 mg Oral BID  . enoxaparin (LOVENOX) injection  1 mg/kg Subcutaneous Q12H  . folic acid  1 mg Oral q morning - 10a  . HYDROmorphone  8 mg Oral Q4H  . hydroxyurea  500 mg Oral BID  . ketorolac  30 mg Intravenous Q6H  . lisinopril  10 mg Oral Q2200  . OLANZapine  10 mg Oral QHS  . OxyCODONE  80 mg Oral Q12H  . senna  1 tablet Oral BID  . warfarin  10 mg Oral ONCE-1800  . Warfarin - Pharmacist Dosing Inpatient   Does not apply q1800   Continuous Infusions: . sodium chloride 50 mL/hr at 05/01/12 0708    Time spent 45 minutes.

## 2012-05-01 NOTE — Progress Notes (Signed)
ANTICOAGULATION CONSULT NOTE - Follow Up  Pharmacy Consult for Warfarin/Lovenox Indication: Hx PE  Allergies  Allergen Reactions  . Morphine And Related Hives and Rash    Pt states he is able to tolerate Dilaudid with no reactions.    Patient Measurements: Height: 6' (182.9 cm) Weight: 171 lb 12.8 oz (77.928 kg) IBW/kg (Calculated) : 77.6   Vital Signs: Temp: 99 F (37.2 C) (04/01 0500) Temp src: Oral (04/01 0500) BP: 109/72 mmHg (04/01 0500) Pulse Rate: 82 (04/01 0500)  Labs:  Recent Labs  04/28/12 1745  04/29/12 0345 04/30/12 0455 05/01/12 0530  HGB  --   < > 7.6* 7.0* 6.8*  HCT  --   --  22.0* 20.6* 19.6*  PLT  --   --  481* 479* 488*  LABPROT  --   --  18.6* 17.0* 18.6*  INR  --   --  1.61* 1.42 1.61*  CREATININE 0.60  --  0.52  --  0.61  < > = values in this interval not displayed.  Estimated Creatinine Clearance: 145.5 ml/min (by C-G formula based on Cr of 0.61).   Medical History: Past Medical History  Diagnosis Date  . Sickle cell anemia   . Blood transfusion   . 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 Hypokalemia   . Mood disorder   . Pulmonary embolism   . Avascular necrosis   . Leukocytosis     Chronic  . Thrombocytosis     Chronic  . Hypertension     Medications:  Scheduled:  . docusate sodium  100 mg Oral BID  . enoxaparin (LOVENOX) injection  1 mg/kg Subcutaneous Q12H  . folic acid  1 mg Oral q morning - 10a  . hydroxyurea  500 mg Oral BID  . lisinopril  10 mg Oral Q2200  . OLANZapine  10 mg Oral QHS  . OxyCODONE  80 mg Oral Q12H  . senna  1 tablet Oral BID  . [COMPLETED] warfarin  10 mg Oral ONCE-1800  . Warfarin - Pharmacist Dosing Inpatient   Does not apply q1800  . [DISCONTINUED] HYDROmorphone PCA 0.3 mg/mL   Intravenous Q4H  . [DISCONTINUED] potassium chloride SA  20 mEq Oral BID   Infusions:  . sodium chloride 50 mL/hr at 05/01/12 0708    Assessment:  32 yo with history of sickle cell disease  on chronic coumadin for PE   Dose prior to admission = 5 mg daily except 7.5 mg on MWF  Hgb low secondary to SCD; no bleeding reported  INR subtherapeutic - was down yesterday, now increasing  Doses this adm: 7.5mg , 5mg , 7.5mg , 10mg   Goal: INR 2-3 LMWH levels and monitor platelets     Plan:   Warfarin 10mg  today due to continued drop in INR  Daily PT/INR  Continue Lovenox 80 mg Q12h while INR < 2.  Per MD verbal order, Pharmacy to discontinue Lovenox when INR is > 2.   Note patient counseled by pharmacist 3/230. Will reinforce education prior to discharge   Gwen Her PharmD  [phone] 05/01/2012 7:59 AM

## 2012-05-01 NOTE — Progress Notes (Signed)
Patient ambulated in the hallway.Hulda Marin RN

## 2012-05-02 MED ORDER — OXYCODONE HCL 80 MG PO TB12: 80.0000 mg | ORAL_TABLET | Freq: Two times a day (BID) | ORAL | Status: DC

## 2012-05-02 MED ORDER — HYDROMORPHONE HCL 4 MG PO TABS: 4.0000 mg | ORAL_TABLET | ORAL | Status: DC | PRN

## 2012-05-02 MED ORDER — HYDROXYUREA 500 MG PO CAPS: 500.0000 mg | ORAL_CAPSULE | Freq: Two times a day (BID) | ORAL | Status: DC

## 2012-05-02 MED ORDER — HEPARIN SOD (PORK) LOCK FLUSH 100 UNIT/ML IV SOLN
INTRAVENOUS | Status: AC
  Filled 2012-05-02: qty 5

## 2012-05-02 MED ORDER — IBUPROFEN 800 MG PO TABS: 800.0000 mg | ORAL_TABLET | Freq: Three times a day (TID) | ORAL | Status: DC | PRN

## 2012-05-02 MED ORDER — DSS 100 MG PO CAPS: 100.0000 mg | ORAL_CAPSULE | Freq: Two times a day (BID) | ORAL | Status: DC

## 2012-05-02 MED ORDER — DIPHENHYDRAMINE HCL 25 MG PO CAPS: 25.0000 mg | ORAL_CAPSULE | ORAL | Status: DC | PRN

## 2012-05-02 MED ORDER — WARFARIN SODIUM 7.5 MG PO TABS
7.5000 mg | ORAL_TABLET | Freq: Once | ORAL | Status: DC
  Filled 2012-05-02: qty 1

## 2012-05-02 NOTE — H&P (Signed)
Pt seen and examined and discussed with NP Michelle [name]. Agree with assessment and plan. 

## 2012-05-02 NOTE — Progress Notes (Signed)
Pt seen and examined and discussed with NP Michelle [name]. Agree with assessment and plan. 

## 2012-05-02 NOTE — Discharge Summary (Signed)
Physician Discharge Summary  Patient ID: [name] MRN: [id number] DOB/AGE: 33-Jul-1982 32 y.o.  Admit date: 04/27/2012 Discharge date: 05/02/2012  Admission Diagnoses:  Discharge Diagnoses:  Principal Problem:   Sickle cell pain crisis Active Problems:   Leukocytosis   Hypokalemia   Thrombocytosis   Sickle cell anemia   Hypertension   Pulmonary embolism with hypoxia   Discharged Condition: good  Hospital Course:  1. Sickle Cell Pain Crisis: Patient understands that he will always be in pain but just be able to tolerate it.  He is now stable.  He lacks insight into his disease process. I have discussed with [name] that he needs to followup in the clinic as early as next week. He is much more motivated now. I will give him refills of his home medications in full. Once he is at home we'll have social worker follow up with him and make sure that he complies with treatment.  #2 persistent leukocytosis: Most likely due to his vaso-occlusive disease. It is now much better and stable.  #3 hypokalemia: This was appropriately repleted.  #4 sickle cell anemia: H&H is stable at his baseline. He will have followup and blood work in the office.  #5 hypertension: Blood pressure is well controlled at this point.  #6 chronic anticoagulation: Continue with Coumadin. PT/INR will be followed up as an outpatient on file the adjustment to his medications will be done.  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 Possible autosplenectomy.  Consults: None  Significant Diagnostic Studies: labs: Stable hemoglobin currently at 6.8.  Treatments: IV hydration, analgesia: acetaminophen and Dilaudid and anticoagulation: warfarin  Discharge Exam: Blood pressure 131/81, pulse 96, temperature 99 F (37.2 C), temperature source Oral, resp. rate 20, height 6' (1.829 m), weight 72.485 kg (159 lb 12.8 oz), SpO2 97.00%. General appearance: alert, cooperative and no  distress Eyes: conjunctivae/corneas clear. PERRL, EOM's intact. Fundi benign. Neck: no adenopathy, no carotid bruit, no JVD, supple, symmetrical, trachea midline and thyroid not enlarged, symmetric, no tenderness/mass/nodules Resp: clear to auscultation bilaterally Cardio: regular rate and rhythm, S1, S2 normal, no murmur, click, rub or gallop GI: soft, non-tender; bowel sounds normal; no masses,  no organomegaly Skin: Skin color, texture, turgor normal. No rashes or lesions Neurologic: Grossly normal  Disposition: 01-Home or Self Care     Medication List    TAKE these medications       diphenhydrAMINE 25 mg capsule  Commonly known as:  BENADRYL  Take 1-2 capsules (25-50 mg total) by mouth every 4 (four) hours as needed for itching.     DSS 100 MG Caps  Take 100 mg by mouth 2 (two) times daily.     folic acid 1 MG tablet  Commonly known as:  FOLVITE  Take 1 mg by mouth every morning.     HYDROmorphone 4 MG tablet  Commonly known as:  DILAUDID  Take 1-2 tablets (4-8 mg total) by mouth every 4 (four) hours as needed for pain. For pain.     hydroxyurea 500 MG capsule  Commonly known as:  HYDREA  Take 1 capsule (500 mg total) by mouth 2 (two) times daily. May take with food to minimize GI side effects.     ibuprofen 800 MG tablet  Commonly known as:  ADVIL,MOTRIN  Take 1 tablet (800 mg total) by mouth every 8 (eight) hours as needed for pain. Do not exceed use for more than 20/30 days     lisinopril 10 MG tablet  Commonly  known as:  PRINIVIL,ZESTRIL  Take 1 tablet (10 mg total) by mouth daily at 10 pm.     OLANZapine 10 MG tablet  Commonly known as:  ZYPREXA  Take 10 mg by mouth at bedtime.     oxyCODONE 80 MG 12 hr tablet  Commonly known as:  OXYCONTIN  Take 1 tablet (80 mg total) by mouth every 12 (twelve) hours.     potassium chloride SA 20 MEQ tablet  Commonly known as:  K-DUR,KLOR-CON  Take 1 tablet (20 mEq total) by mouth 2 (two) times daily.     warfarin 5  MG tablet  Commonly known as:  COUMADIN  Take 5-7.5 mg by mouth every evening. Takes 5 mg daily except Monday Wednesday and Friday he takes 7.5 mg     zolpidem 10 MG tablet  Commonly known as:  AMBIEN  Take 1 tablet (10 mg total) by mouth at bedtime as needed. For sleep         Signed: Maitlyn [name],[name] 05/02/2012, 10:32 AM

## 2012-05-02 NOTE — Progress Notes (Signed)
ANTICOAGULATION CONSULT NOTE - Follow Up  Pharmacy Consult for Warfarin/Lovenox Indication: Hx PE  Allergies  Allergen Reactions  . Morphine And Related Hives and Rash    Pt states he is able to tolerate Dilaudid with no reactions.    Patient Measurements: Height: 6' (182.9 cm) Weight: 159 lb 12.8 oz (72.485 kg) IBW/kg (Calculated) : 77.6   Vital Signs: Temp: 98.9 F (37.2 C) (04/02 0556) Temp src: Oral (04/02 0556) BP: 116/76 mmHg (04/02 0556) Pulse Rate: 79 (04/02 0556)  Labs:  Recent Labs  04/30/12 0455 05/01/12 0530 05/02/12 0539  HGB 7.0* 6.8*  --   HCT 20.6* 19.6*  --   PLT 479* 488*  --   LABPROT 17.0* 18.6* 22.7*  INR 1.42 1.61* 2.10*  CREATININE  --  0.61  --     Estimated Creatinine Clearance: 135.9 ml/min (by C-G formula based on Cr of 0.61).   Medical History: Past Medical History  Diagnosis Date  . Sickle cell anemia   . Blood transfusion   . 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 Hypokalemia   . Mood disorder   . Pulmonary embolism   . Avascular necrosis   . Leukocytosis     Chronic  . Thrombocytosis     Chronic  . Hypertension     Medications:  Scheduled:  . docusate sodium  100 mg Oral BID  . enoxaparin (LOVENOX) injection  1 mg/kg Subcutaneous Q12H  . folic acid  1 mg Oral q morning - 10a  . HYDROmorphone  8 mg Oral Q4H  . hydroxyurea  500 mg Oral BID  . ketorolac  30 mg Intravenous Q6H  . lisinopril  10 mg Oral Q2200  . OLANZapine  10 mg Oral QHS  . OxyCODONE  80 mg Oral Q12H  . senna  1 tablet Oral BID  . [COMPLETED] warfarin  10 mg Oral ONCE-1800  . Warfarin - Pharmacist Dosing Inpatient   Does not apply q1800   Infusions:  . sodium chloride 20 mL/hr at 05/01/12 1645    Assessment:  32 yo with history of sickle cell disease on chronic coumadin for PE   Dose prior to admission = 5 mg daily except 7.5 mg on MWF  Hgb low secondary to SCD; no bleeding reported  INR up within therapeutic  range  Doses this adm: 7.5mg , 5mg , 7.5mg , 10mg , 10mg   Goal: INR 2-3   Plan:   Warfarin 7.5mg  today  Daily PT/INR  D/C lovenox  Gwen Her PharmD  [phone] 05/02/2012 7:10 AM

## 2012-05-02 NOTE — Discharge Summary (Signed)
Pt seen and examined and discussed with NP [name]. Agree with assessment and discharge home.

## 2012-05-02 NOTE — Care Management (Signed)
CM spoke with patient regarding making an appointment with Dr. Ashley Royalty for a regular check up. Patient stated "in the past he depends on his brother for transportation, however he will have his car back next week and will be able to make his appointments to the center." This CM advised patient to make sure he calls the [facility] if he is having transportation problems so that we can help him get transportation to his appointment. Patient verbalized understanding. Patient stated he feels a lot better than he was feeling when he came in.    Karoline Caldwell RN, BSN, [place] [phone]

## 2012-05-03 ENCOUNTER — Telehealth (HOSPITAL_COMMUNITY): Payer: Self-pay

## 2012-05-03 NOTE — Telephone Encounter (Signed)
CM spoke with patient to set up follow up appointment from inpatient visit on 04/27/12-05/02/2012.  Patient's appointment scheduled for [birth year]@1pm  (His birthday). Patient stated he would be here.

## 2012-05-08 ENCOUNTER — Encounter (HOSPITAL_COMMUNITY): Payer: Self-pay | Admitting: *Deleted

## 2012-05-08 ENCOUNTER — Emergency Department (HOSPITAL_COMMUNITY): Payer: Medicare Other

## 2012-05-08 ENCOUNTER — Emergency Department (HOSPITAL_COMMUNITY)
Admission: EM | Admit: 2012-05-08 | Discharge: 2012-05-08 | Disposition: A | Discharge status: Home / Self Care | Payer: Medicare Other | Attending: Emergency Medicine | Admitting: Emergency Medicine

## 2012-05-08 DIAGNOSIS — Z8659 Personal history of other mental and behavioral disorders: Secondary | ICD-10-CM | POA: Insufficient documentation

## 2012-05-08 DIAGNOSIS — Z862 Personal history of diseases of the blood and blood-forming organs and certain disorders involving the immune mechanism: Secondary | ICD-10-CM | POA: Diagnosis not present

## 2012-05-08 DIAGNOSIS — I10 Essential (primary) hypertension: Secondary | ICD-10-CM | POA: Diagnosis not present

## 2012-05-08 DIAGNOSIS — D5700 Hb-SS disease with crisis, unspecified: Secondary | ICD-10-CM | POA: Insufficient documentation

## 2012-05-08 DIAGNOSIS — Z8739 Personal history of other diseases of the musculoskeletal system and connective tissue: Secondary | ICD-10-CM | POA: Insufficient documentation

## 2012-05-08 DIAGNOSIS — Z87891 Personal history of nicotine dependence: Secondary | ICD-10-CM | POA: Diagnosis not present

## 2012-05-08 DIAGNOSIS — Z79899 Other long term (current) drug therapy: Secondary | ICD-10-CM | POA: Insufficient documentation

## 2012-05-08 DIAGNOSIS — Z7901 Long term (current) use of anticoagulants: Secondary | ICD-10-CM | POA: Insufficient documentation

## 2012-05-08 DIAGNOSIS — Z86711 Personal history of pulmonary embolism: Secondary | ICD-10-CM | POA: Insufficient documentation

## 2012-05-08 DIAGNOSIS — Z86718 Personal history of other venous thrombosis and embolism: Secondary | ICD-10-CM | POA: Diagnosis not present

## 2012-05-08 DIAGNOSIS — G8929 Other chronic pain: Secondary | ICD-10-CM | POA: Insufficient documentation

## 2012-05-08 DIAGNOSIS — D571 Sickle-cell disease without crisis: Secondary | ICD-10-CM

## 2012-05-08 DIAGNOSIS — E876 Hypokalemia: Secondary | ICD-10-CM | POA: Diagnosis not present

## 2012-05-08 LAB — CBC WITH DIFFERENTIAL/PLATELET
Basophils Relative: 1 % (ref 0–1)
HCT: 20.9 % — ABNORMAL LOW (ref 39.0–52.0)
Lymphs Abs: 3.1 10*3/uL (ref 0.7–4.0)
MCH: 33.5 pg (ref 26.0–34.0)
MCHC: 35.9 g/dL (ref 30.0–36.0)
MCV: 93.3 fL (ref 78.0–100.0)
Neutro Abs: 14.7 10*3/uL — ABNORMAL HIGH (ref 1.7–7.7)
RDW: 22.8 % — ABNORMAL HIGH (ref 11.5–15.5)

## 2012-05-08 LAB — URINALYSIS, ROUTINE W REFLEX MICROSCOPIC
Glucose, UA: NEGATIVE mg/dL
Hgb urine dipstick: NEGATIVE
Protein, ur: NEGATIVE mg/dL
Specific Gravity, Urine: 1.018 (ref 1.005–1.030)

## 2012-05-08 LAB — TROPONIN I: Troponin I: <0.3 ng/mL (ref <0.30)

## 2012-05-08 LAB — COMPREHENSIVE METABOLIC PANEL
ALT: 26 U/L (ref 0–53)
AST: 25 U/L (ref 0–37)
Albumin: 3.9 g/dL (ref 3.5–5.2)
Chloride: 103 mEq/L (ref 96–112)
Creatinine, Ser: 0.56 mg/dL (ref 0.50–1.35)
Sodium: 134 mEq/L — ABNORMAL LOW (ref 135–145)
Total Bilirubin: 3.3 mg/dL — ABNORMAL HIGH (ref 0.3–1.2)

## 2012-05-08 LAB — RETICULOCYTES
RBC.: 2.24 MIL/uL — ABNORMAL LOW (ref 4.22–5.81)
Retic Ct Pct: 14.5 % — ABNORMAL HIGH (ref 0.4–3.1)

## 2012-05-08 MED ORDER — HYDROMORPHONE HCL PF 2 MG/ML IJ SOLN
2.0000 mg | Freq: Once | INTRAMUSCULAR | Status: AC
  Administered 2012-05-08: 2 mg via INTRAVENOUS
  Filled 2012-05-08: qty 1

## 2012-05-08 MED ORDER — HYDROMORPHONE HCL PF 2 MG/ML IJ SOLN
2.0000 mg | Freq: Once | INTRAMUSCULAR | Status: AC
  Administered 2012-05-08: 2 mg via INTRAVENOUS
  Filled 2012-05-08 (×2): qty 1

## 2012-05-08 MED ORDER — HYDROMORPHONE HCL 2 MG PO TABS
4.0000 mg | ORAL_TABLET | Freq: Once | ORAL | Status: AC
  Administered 2012-05-08: 4 mg via ORAL
  Filled 2012-05-08: qty 2

## 2012-05-08 MED ORDER — HEPARIN SOD (PORK) LOCK FLUSH 100 UNIT/ML IV SOLN
INTRAVENOUS | Status: AC
  Administered 2012-05-08: 500 [IU]
  Filled 2012-05-08: qty 5

## 2012-05-08 MED ORDER — SODIUM CHLORIDE 0.9 % IV SOLN
Freq: Once | INTRAVENOUS | Status: AC
  Administered 2012-05-08: 05:00:00 via INTRAVENOUS

## 2012-05-08 NOTE — ED Notes (Signed)
Pt states hurting in left and right side of ribs,  Last hospitalized 2 weeks ago,  Unable to control pain at home

## 2012-05-08 NOTE — ED Provider Notes (Signed)
History     CSN: [id number]  Arrival date & time 05/08/12  4098   First MD Initiated Contact with Patient 05/08/12 [phone]      Chief Complaint  Patient presents with  . Sickle Cell Pain Crisis    (Consider location/radiation/quality/duration/timing/severity/associated sxs/prior treatment) HPI This 32 year old male has sickle cell anemia with chronic severe pain 24 hours a day to his chest back arms and legs with recent hospitalization within the last 2 weeks for the same symptoms, he states since he got out of the hospital he was unable to fill his prescription for his narcotics and states that [facility] as his prescription but will not release his medications for his pain control, he states he would not be in the ED if he had his home medications but he is typical severe pain without access to his medications now, he is no fever no cough no shortness of breath no abdominal pain no vomiting his typical severe pain to his chest back arms and legs there is no treatment prior to arrival. He states he is taking fluids well and does not need IV hydration but needs IV access for Dilaudid because he is been out of his narcotics for so long he does not think pills will help right now for his pain. Past Medical History  Diagnosis Date  . Sickle cell anemia   . Blood transfusion   . 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 Hypokalemia   . Mood disorder   . Pulmonary embolism   . Avascular necrosis   . Leukocytosis     Chronic  . Thrombocytosis     Chronic  . Hypertension     Past Surgical History  Procedure Laterality Date  . Right hip replacement      08/2006  . Cholecystectomy      01/2008  . Porta cath placement    . Porta cath removal    . Umbilical hernia repair      01/2008  . Excision of left periauricular cyst      10/2009  . Excision of right ear lobe cyst with primary closur      11/2007  . Portacath placement  01/05/2012    Procedure: INSERTION  PORT-A-CATH;  Surgeon: Adolph Pollack, MD;  Location: [facility] OR;  Service: General;  Laterality: N/A;  ultrasound guiced port a cath insertion with fluoroscopy    Family History  Problem Relation Age of Onset  . Sickle cell anemia Mother   . Sickle cell anemia Father   . Sickle cell trait Brother     History  Substance Use Topics  . Smoking status: Former Smoker -- 13 years    Types: Cigarettes    Quit date: 07/08/2010  . Smokeless tobacco: Never Used  . Alcohol Use: No      Review of Systems 10 Systems reviewed and are negative for acute change except as noted in the HPI. Allergies  Morphine and related  Home Medications   Current Outpatient Rx  Name  Route  Sig  Dispense  Refill  . diphenhydrAMINE (BENADRYL) 25 mg capsule   Oral   Take 1-2 capsules (25-50 mg total) by mouth every 4 (four) hours as needed for itching.   30 capsule   0   . folic acid (FOLVITE) 1 MG tablet   Oral   Take 1 mg by mouth every morning.         Marland Kitchen HYDROmorphone (DILAUDID) 4 MG tablet  Oral   Take 1-2 tablets (4-8 mg total) by mouth every 4 (four) hours as needed for pain. For pain.   60 tablet   0   . hydroxyurea (HYDREA) 500 MG capsule   Oral   Take 1 capsule (500 mg total) by mouth 2 (two) times daily. May take with food to minimize GI side effects.   60 capsule   2   . ibuprofen (ADVIL,MOTRIN) 800 MG tablet   Oral   Take 1 tablet (800 mg total) by mouth every 8 (eight) hours as needed for pain. Do not exceed use for more than 20/30 days   30 tablet   0   . lisinopril (PRINIVIL,ZESTRIL) 10 MG tablet   Oral   Take 1 tablet (10 mg total) by mouth daily at 10 pm.   30 tablet   2   . potassium chloride SA (K-DUR,KLOR-CON) 20 MEQ tablet   Oral   Take 1 tablet (20 mEq total) by mouth 2 (two) times daily.   60 tablet   1   . warfarin (COUMADIN) 5 MG tablet   Oral   Take 7.5 mg by mouth every evening. Take 7.5 every day until 05/10/12 next appointment         . zolpidem  (AMBIEN) 10 MG tablet   Oral   Take 1 tablet (10 mg total) by mouth at bedtime as needed. For sleep   30 tablet   0     BP 116/80  Pulse 66  Temp(Src) 98.3 F (36.8 C) (Oral)  Resp 12  SpO2 100%  Physical Exam  Nursing note and vitals reviewed. Constitutional:  Awake, alert, nontoxic appearance.  HENT:  Head: Atraumatic.  Eyes: Right eye exhibits no discharge. Left eye exhibits no discharge.  Neck: Neck supple.  Cardiovascular: Normal rate and regular rhythm.   No murmur heard. Pulmonary/Chest: Effort normal. No respiratory distress. He has no wheezes. He has no rales. He exhibits tenderness.  Abdominal: Soft. There is no tenderness. There is no rebound.  Musculoskeletal: He exhibits tenderness. He exhibits no edema.  Baseline ROM, no obvious new focal weakness. Patient has diffuse baseline tenderness to his chest back arms and legs.  Neurological: He is alert.  Mental status and motor strength appears baseline for patient and situation.  Skin: No rash noted.  Psychiatric: He has a normal mood and affect.    ED Course  Procedures (including critical care time) ECG: Sinus rhythm, ventricular rate 76, left axis deviation, T-wave inversions anterior leads, no significant change noted compared with February 2014  Pt stable in ED with no significant deterioration in condition.  Await Case Mgmt eval. To assist Pt in obtaining his opiate meds for home. CM will see Pt in ED this AM. 0800  Labs Reviewed  CBC WITH DIFFERENTIAL - Abnormal; Notable for the following:    WBC 20.7 (*)    RBC 2.24 (*)    Hemoglobin 7.5 (*)    HCT 20.9 (*)    RDW 22.8 (*)    Platelets 552 (*)    Neutro Abs 14.7 (*)    Monocytes Absolute 2.3 (*)    Basophils Absolute 0.2 (*)    All other components within normal limits  COMPREHENSIVE METABOLIC PANEL - Abnormal; Notable for the following:    Sodium 134 (*)    Glucose, Bld 102 (*)    BUN 5 (*)    Total Bilirubin 3.3 (*)    All other  components within normal limits  URINALYSIS, ROUTINE W REFLEX MICROSCOPIC - Abnormal; Notable for the following:    Color, Urine AMBER (*)    All other components within normal limits  RETICULOCYTES - Abnormal; Notable for the following:    Retic Ct Pct 14.5 (*)    RBC. 2.24 (*)    Retic Count, Manual 324.8 (*)    All other components within normal limits  TROPONIN I   No results found.   1. Chronic pain   2. Sickle cell anemia       MDM  Dispo pending.        Hurman Horn, MD 05/14/12 [phone]

## 2012-05-08 NOTE — ED Notes (Signed)
Pt alert and oriented x4. Respirations even and unlabored, bilateral symmetrical rise and fall of chest. Skin warm and dry. In no acute distress. Denies needs.   

## 2012-05-08 NOTE — ED Notes (Signed)
Pt escorted to discharge window. Pt verbalized understanding discharge instructions. In no acute distress.  

## 2012-05-08 NOTE — Progress Notes (Signed)
[facility] ED CM left a voice message at Dr Mikeal Hawthorne office [facility] [phone]) requesting a return call to CM to provide to pt. CM spoke with Lahoma Rocker to update him on progression/status of pre authorization for his medication at [phone] He confirms also he has [facility] number and will also check with [facility] periodically

## 2012-05-08 NOTE — ED Notes (Signed)
Iv team paged x 2  

## 2012-05-08 NOTE — Progress Notes (Signed)
[facility] ED CM consulted by Dr Freida Busman about medication pt's medication issues  CM consulted with Worthy Rancher T, Sickle cell anemia ([phone]) CM about these concerns Pt d/c by Dr Mikeal Hawthorne with Oxycontin Rx the presently needs pre authorization by Dr Mikeal Hawthorne per [facility] so that pt will be able to obtain this Rx from [facility] pharmacy off of [address] CM spoke with pt and discussed this issue with him after a call from ED charge RN.  CM updated EDP, Allen  Pt to be d/c home and will be contacted by ED CM and/or Sickle cell anemia CM when pre authorization completed and medication can be picked up from [facility] Pt agreed  Checked [facility] schedule.  At 1116 CM spoke with [place] at [facility] center _Dr Mikeal Hawthorne office to leave a message to have him pre authorize pt medication Provided CM contact number, the [facility] on [address] number [phone] and the medicaid Prior Approval Unit (Medical and Dental): [phone] Prior Approval Unit (Pharmacy): [phone] numbers  Pending pre authorization per Dr Mikeal Hawthorne

## 2012-05-08 NOTE — ED Notes (Signed)
IV team paged for port access 

## 2012-05-08 NOTE — Progress Notes (Signed)
At 1235 05/08/12 ED CM left a voice message at [phone] for [facility] office to return call to CM to assist with pt's medicaid pre authorization of d/c medicine- oxycontin Pending a return call

## 2012-05-08 NOTE — ED Notes (Signed)
Spoke with Aggie Cosier, RN IV team- aware of need for port access-

## 2012-05-08 NOTE — ED Notes (Signed)
Pt made aware he is waiting for case management to come and talk to him. Pt alert and oriented x4. Respirations even and unlabored, bilateral symmetrical rise and fall of chest. Skin warm and dry. In no acute distress. Denies needs.

## 2012-05-09 DIAGNOSIS — Z79899 Other long term (current) drug therapy: Secondary | ICD-10-CM | POA: Diagnosis not present

## 2012-05-09 DIAGNOSIS — I10 Essential (primary) hypertension: Secondary | ICD-10-CM | POA: Diagnosis not present

## 2012-05-09 DIAGNOSIS — D5700 Hb-SS disease with crisis, unspecified: Secondary | ICD-10-CM | POA: Diagnosis not present

## 2012-05-09 DIAGNOSIS — G8929 Other chronic pain: Secondary | ICD-10-CM | POA: Diagnosis not present

## 2012-05-09 DIAGNOSIS — I2789 Other specified pulmonary heart diseases: Secondary | ICD-10-CM | POA: Diagnosis not present

## 2012-05-09 NOTE — Progress Notes (Signed)
CM received a call from Sickle cell CM, Winnie. Discussed update on progress of pre authorization of oxycontin for pt.  Dr Mikeal Hawthorne listed on [email or web address]  1422 paged Dr Mikeal Hawthorne [phone]) He returned a call to ED CM at 1425 Dr Mikeal Hawthorne states he prefers pt have the pcp, Dr [name] to complete the pre authorization process for this pt and assist the pt during his 05/10/12 sickle cell clinic appointment 1429 CM left a voice message with Carollee Herter for [place], sickle cell anemia CM that Mikeal Hawthorne prefers pt be assisted during 05/10/12 sickle cell clinic appointment  1433 ED CM spoke with the pt who confirms he feels okay today CM reviewed with him information about the call to [facility], Dr Mikeal Hawthorne and the sickle cell clinic Discussed with him that the oxycontin and pre authorization issues can be addressed at his 1300 05/10/12 sickle cell clinic appointment He stated "that's ok"  [facility] ED CM signing off

## 2012-05-09 NOTE — Progress Notes (Signed)
[facility] ED CM Spoke with [place] at [facility] [address] [place], [place] [address] [phone] who confirms the oxycontin still has not been pre authorized at this time

## 2012-05-10 ENCOUNTER — Non-Acute Institutional Stay (HOSPITAL_COMMUNITY)
Admission: AD | Admit: 2012-05-10 | Discharge: 2012-05-10 | Disposition: A | Discharge status: Home / Self Care | Payer: Medicare Other | Attending: Internal Medicine | Admitting: Internal Medicine

## 2012-05-10 DIAGNOSIS — D571 Sickle-cell disease without crisis: Secondary | ICD-10-CM | POA: Diagnosis not present

## 2012-05-10 DIAGNOSIS — I10 Essential (primary) hypertension: Secondary | ICD-10-CM | POA: Diagnosis not present

## 2012-05-10 DIAGNOSIS — F319 Bipolar disorder, unspecified: Secondary | ICD-10-CM | POA: Diagnosis not present

## 2012-05-10 DIAGNOSIS — Z452 Encounter for adjustment and management of vascular access device: Secondary | ICD-10-CM | POA: Insufficient documentation

## 2012-05-10 DIAGNOSIS — I2789 Other specified pulmonary heart diseases: Secondary | ICD-10-CM | POA: Diagnosis not present

## 2012-05-10 DIAGNOSIS — D5700 Hb-SS disease with crisis, unspecified: Secondary | ICD-10-CM | POA: Diagnosis not present

## 2012-05-10 DIAGNOSIS — I2699 Other pulmonary embolism without acute cor pulmonale: Secondary | ICD-10-CM | POA: Diagnosis not present

## 2012-05-10 LAB — CBC WITH DIFFERENTIAL/PLATELET
Basophils Absolute: 0.2 K/uL — ABNORMAL HIGH (ref 0.0–0.1)
Basophils Relative: 1 % (ref 0–1)
Eosinophils Absolute: 0.5 K/uL (ref 0.0–0.7)
Eosinophils Relative: 2 % (ref 0–5)
HCT: 22.3 % — ABNORMAL LOW (ref 39.0–52.0)
Hemoglobin: 7.7 g/dL — ABNORMAL LOW (ref 13.0–17.0)
Lymphocytes Relative: 12 % (ref 12–46)
Lymphs Abs: 2.8 K/uL (ref 0.7–4.0)
MCH: 32.6 pg (ref 26.0–34.0)
MCHC: 34.5 g/dL (ref 30.0–36.0)
MCV: 94.5 fL (ref 78.0–100.0)
Monocytes Absolute: 2.6 K/uL — ABNORMAL HIGH (ref 0.1–1.0)
Monocytes Relative: 11 % (ref 3–12)
Neutro Abs: 17.5 K/uL — ABNORMAL HIGH (ref 1.7–7.7)
Neutrophils Relative %: 74 % (ref 43–77)
Platelets: 568 K/uL — ABNORMAL HIGH (ref 150–400)
RBC: 2.36 MIL/uL — ABNORMAL LOW (ref 4.22–5.81)
RDW: 21.8 % — ABNORMAL HIGH (ref 11.5–15.5)
WBC: 23.6 K/uL — ABNORMAL HIGH (ref 4.0–10.5)

## 2012-05-10 LAB — PROTIME-INR: Prothrombin Time: 17.4 seconds — ABNORMAL HIGH (ref 11.6–15.2)

## 2012-05-10 LAB — COMPREHENSIVE METABOLIC PANEL
Albumin: 4 g/dL (ref 3.5–5.2)
BUN: 6 mg/dL (ref 6–23)
Creatinine, Ser: 0.56 mg/dL (ref 0.50–1.35)
Total Protein: 7.5 g/dL (ref 6.0–8.3)

## 2012-05-10 LAB — MAGNESIUM: Magnesium: 1.8 mg/dL (ref 1.5–2.5)

## 2012-05-10 MED ORDER — SODIUM CHLORIDE 0.9 % IJ SOLN
10.0000 mL | INTRAMUSCULAR | Status: AC | PRN
  Administered 2012-05-10: 10 mL

## 2012-05-10 MED ORDER — HEPARIN SOD (PORK) LOCK FLUSH 100 UNIT/ML IV SOLN
500.0000 [IU] | INTRAVENOUS | Status: AC | PRN
  Administered 2012-05-10: 500 [IU]
  Filled 2012-05-10: qty 5

## 2012-05-10 NOTE — Progress Notes (Signed)
Patient [facility] for lab draw only. Port accessed, labs drawn and de-accessed with no complications. VVS stable and patient tolerated well. [facility] via ambulatory at 14:15.

## 2012-05-13 ENCOUNTER — Encounter (HOSPITAL_COMMUNITY): Payer: Self-pay

## 2012-05-13 ENCOUNTER — Emergency Department (HOSPITAL_COMMUNITY)
Admission: EM | Admit: 2012-05-13 | Discharge: 2012-05-13 | Disposition: A | Discharge status: Home / Self Care | Payer: Medicare Other | Attending: Emergency Medicine | Admitting: Emergency Medicine

## 2012-05-13 DIAGNOSIS — I10 Essential (primary) hypertension: Secondary | ICD-10-CM | POA: Diagnosis not present

## 2012-05-13 DIAGNOSIS — Z79899 Other long term (current) drug therapy: Secondary | ICD-10-CM | POA: Diagnosis not present

## 2012-05-13 DIAGNOSIS — D5700 Hb-SS disease with crisis, unspecified: Secondary | ICD-10-CM | POA: Diagnosis not present

## 2012-05-13 DIAGNOSIS — Z8659 Personal history of other mental and behavioral disorders: Secondary | ICD-10-CM | POA: Diagnosis not present

## 2012-05-13 DIAGNOSIS — Z862 Personal history of diseases of the blood and blood-forming organs and certain disorders involving the immune mechanism: Secondary | ICD-10-CM | POA: Insufficient documentation

## 2012-05-13 DIAGNOSIS — Z87891 Personal history of nicotine dependence: Secondary | ICD-10-CM | POA: Diagnosis not present

## 2012-05-13 DIAGNOSIS — D473 Essential (hemorrhagic) thrombocythemia: Secondary | ICD-10-CM | POA: Diagnosis not present

## 2012-05-13 DIAGNOSIS — D72829 Elevated white blood cell count, unspecified: Secondary | ICD-10-CM | POA: Insufficient documentation

## 2012-05-13 DIAGNOSIS — Z8679 Personal history of other diseases of the circulatory system: Secondary | ICD-10-CM | POA: Diagnosis not present

## 2012-05-13 LAB — CBC WITH DIFFERENTIAL/PLATELET
Basophils Absolute: 0.2 10*3/uL — ABNORMAL HIGH (ref 0.0–0.1)
HCT: 23.9 % — ABNORMAL LOW (ref 39.0–52.0)
Hemoglobin: 8.1 g/dL — ABNORMAL LOW (ref 13.0–17.0)
Lymphocytes Relative: 10 % — ABNORMAL LOW (ref 12–46)
Monocytes Absolute: 2.3 10*3/uL — ABNORMAL HIGH (ref 0.1–1.0)
Neutro Abs: 16.4 10*3/uL — ABNORMAL HIGH (ref 1.7–7.7)
Neutrophils Relative %: 77 % (ref 43–77)
RDW: 19.8 % — ABNORMAL HIGH (ref 11.5–15.5)
WBC: 21.3 10*3/uL — ABNORMAL HIGH (ref 4.0–10.5)

## 2012-05-13 LAB — BASIC METABOLIC PANEL
CO2: 20 mEq/L (ref 19–32)
Chloride: 106 mEq/L (ref 96–112)
Creatinine, Ser: 0.53 mg/dL (ref 0.50–1.35)
Potassium: 2.9 mEq/L — ABNORMAL LOW (ref 3.5–5.1)
Sodium: 138 mEq/L (ref 135–145)

## 2012-05-13 LAB — RETICULOCYTES
RBC.: 2.55 MIL/uL — ABNORMAL LOW (ref 4.22–5.81)
Retic Count, Absolute: 372.3 10*3/uL — ABNORMAL HIGH (ref 19.0–186.0)
Retic Ct Pct: 14.6 % — ABNORMAL HIGH (ref 0.4–3.1)

## 2012-05-13 LAB — PROTIME-INR
INR: 1.27 (ref 0.00–1.49)
Prothrombin Time: 15.6 seconds — ABNORMAL HIGH (ref 11.6–15.2)

## 2012-05-13 MED ORDER — HYDROMORPHONE HCL PF 1 MG/ML IJ SOLN
2.0000 mg | INTRAMUSCULAR | Status: AC | PRN
  Administered 2012-05-13 (×3): 2 mg via INTRAVENOUS
  Filled 2012-05-13 (×3): qty 2

## 2012-05-13 MED ORDER — DIPHENHYDRAMINE HCL 50 MG/ML IJ SOLN
25.0000 mg | INTRAMUSCULAR | Status: DC | PRN
  Administered 2012-05-13 (×2): 25 mg via INTRAVENOUS
  Filled 2012-05-13 (×2): qty 1

## 2012-05-13 MED ORDER — HEPARIN SOD (PORK) LOCK FLUSH 100 UNIT/ML IV SOLN
INTRAVENOUS | Status: AC
  Filled 2012-05-13: qty 5

## 2012-05-13 MED ORDER — POTASSIUM CHLORIDE CRYS ER 20 MEQ PO TBCR
40.0000 meq | EXTENDED_RELEASE_TABLET | Freq: Once | ORAL | Status: AC
  Administered 2012-05-13: 40 meq via ORAL
  Filled 2012-05-13: qty 2

## 2012-05-13 MED ORDER — SODIUM CHLORIDE 0.9 % IV BOLUS (SEPSIS)
1000.0000 mL | Freq: Once | INTRAVENOUS | Status: AC
  Administered 2012-05-13: 1000 mL via INTRAVENOUS

## 2012-05-13 MED ORDER — SODIUM CHLORIDE 0.9 % IV SOLN
INTRAVENOUS | Status: DC
  Administered 2012-05-13: 13:00:00 via INTRAVENOUS

## 2012-05-13 NOTE — ED Provider Notes (Signed)
History     CSN: [id number]  Arrival date & time 05/13/12  1117   First MD Initiated Contact with Patient 05/13/12 1133      Chief Complaint  Patient presents with  . Sickle Cell Pain Crisis    (Consider location/radiation/quality/duration/timing/severity/associated sxs/prior treatment) Patient is a 32 y.o. male presenting with sickle cell pain. The history is provided by the patient.  Sickle Cell Pain Crisis    patient here complaining of his usual sickle cell crisis was started for the past 2 days. Denies any fever or cough. Pain is characterized as don't located in his back and legs. Denies any urinary symptoms. No vomiting or diarrhea. Has used his home medications without relief. Denies any headache or neurological changes. Symptoms have been persistent and nothing makes them better  Past Medical History  Diagnosis Date  . Sickle cell anemia   . Blood transfusion   . 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 Hypokalemia   . Mood disorder   . Pulmonary embolism   . Avascular necrosis   . Leukocytosis     Chronic  . Thrombocytosis     Chronic  . Hypertension     Past Surgical History  Procedure Laterality Date  . Right hip replacement      08/2006  . Cholecystectomy      01/2008  . Porta cath placement    . Porta cath removal    . Umbilical hernia repair      01/2008  . Excision of left periauricular cyst      10/2009  . Excision of right ear lobe cyst with primary closur      11/2007  . Portacath placement  01/05/2012    Procedure: INSERTION PORT-A-CATH;  Surgeon: Adolph Pollack, MD;  Location: [facility] OR;  Service: General;  Laterality: N/A;  ultrasound guiced port a cath insertion with fluoroscopy    Family History  Problem Relation Age of Onset  . Sickle cell anemia Mother   . Sickle cell anemia Father   . Sickle cell trait Brother     History  Substance Use Topics  . Smoking status: Former Smoker -- 13 years    Types:  Cigarettes    Quit date: 07/08/2010  . Smokeless tobacco: Never Used  . Alcohol Use: No      Review of Systems  All other systems reviewed and are negative.    Allergies  Morphine and related  Home Medications   Current Outpatient Rx  Name  Route  Sig  Dispense  Refill  . diphenhydrAMINE (BENADRYL) 25 mg capsule   Oral   Take 1-2 capsules (25-50 mg total) by mouth every 4 (four) hours as needed for itching.   30 capsule   0   . folic acid (FOLVITE) 1 MG tablet   Oral   Take 1 mg by mouth every morning.         Marland Kitchen HYDROmorphone (DILAUDID) 4 MG tablet   Oral   Take 1-2 tablets (4-8 mg total) by mouth every 4 (four) hours as needed for pain. For pain.   60 tablet   0   . hydroxyurea (HYDREA) 500 MG capsule   Oral   Take 1 capsule (500 mg total) by mouth 2 (two) times daily. May take with food to minimize GI side effects.   60 capsule   2   . ibuprofen (ADVIL,MOTRIN) 800 MG tablet   Oral   Take 1 tablet (800  mg total) by mouth every 8 (eight) hours as needed for pain. Do not exceed use for more than 20/30 days   30 tablet   0   . lisinopril (PRINIVIL,ZESTRIL) 10 MG tablet   Oral   Take 1 tablet (10 mg total) by mouth daily at 10 pm.   30 tablet   2   . potassium chloride SA (K-DUR,KLOR-CON) 20 MEQ tablet   Oral   Take 1 tablet (20 mEq total) by mouth 2 (two) times daily.   60 tablet   1   . warfarin (COUMADIN) 5 MG tablet   Oral   Take 7.5 mg by mouth every evening. Take 7.5 every day until 05/10/12 next appointment         . zolpidem (AMBIEN) 10 MG tablet   Oral   Take 1 tablet (10 mg total) by mouth at bedtime as needed. For sleep   30 tablet   0     Pulse 88  Temp(Src) 98.4 F (36.9 C) (Oral)  Resp 16  SpO2 97%  Physical Exam  Nursing note and vitals reviewed. Constitutional: He is oriented to person, place, and time. He appears well-developed and well-nourished.  Non-toxic appearance. No distress.  HENT:  Head: Normocephalic and  atraumatic.  Eyes: Conjunctivae, EOM and lids are normal. Pupils are equal, round, and reactive to light.  Neck: Normal range of motion. Neck supple. No tracheal deviation present. No mass present.  Cardiovascular: Normal rate, regular rhythm and normal heart sounds.  Exam reveals no gallop.   No murmur heard. Pulmonary/Chest: Effort normal and breath sounds normal. No stridor. No respiratory distress. He has no decreased breath sounds. He has no wheezes. He has no rhonchi. He has no rales.  Abdominal: Soft. Normal appearance and bowel sounds are normal. He exhibits no distension. There is no tenderness. There is no rebound and no CVA tenderness.  Musculoskeletal: Normal range of motion. He exhibits no edema and no tenderness.  Neurological: He is alert and oriented to person, place, and time. He has normal strength. No cranial nerve deficit or sensory deficit. GCS eye subscore is 4. GCS verbal subscore is 5. GCS motor subscore is 6.  Skin: Skin is warm and dry. No abrasion and no rash noted.  Psychiatric: He has a normal mood and affect. His speech is normal and behavior is normal.    ED Course  Procedures (including critical care time)  Labs Reviewed  CBC WITH DIFFERENTIAL  BASIC METABOLIC PANEL  RETICULOCYTES   No results found.   No diagnosis found.    MDM  Patient given multiple doses of hydromorphone for her sickle cell pain along with IV fluids. He says feels better at this time. He is stable for discharge  Patient also given potassium for his mild hypokalemia       Toy Baker, MD 05/13/12 1435

## 2012-05-13 NOTE — ED Notes (Addendum)
Patient has history of sickle cell and is having pain in his lower back and flank areas that began 2 days ago. Patient denies SOB or chest pain.

## 2012-05-15 ENCOUNTER — Non-Acute Institutional Stay (HOSPITAL_COMMUNITY)
Admission: AD | Admit: 2012-05-15 | Discharge: 2012-05-16 | Disposition: A | Discharge status: Home / Self Care | Payer: Medicare Other | Attending: Internal Medicine | Admitting: Internal Medicine

## 2012-05-15 DIAGNOSIS — K5900 Constipation, unspecified: Secondary | ICD-10-CM | POA: Diagnosis not present

## 2012-05-15 DIAGNOSIS — D473 Essential (hemorrhagic) thrombocythemia: Secondary | ICD-10-CM | POA: Diagnosis not present

## 2012-05-15 DIAGNOSIS — D5700 Hb-SS disease with crisis, unspecified: Secondary | ICD-10-CM | POA: Insufficient documentation

## 2012-05-15 DIAGNOSIS — D72829 Elevated white blood cell count, unspecified: Secondary | ICD-10-CM | POA: Diagnosis not present

## 2012-05-15 DIAGNOSIS — Z86718 Personal history of other venous thrombosis and embolism: Secondary | ICD-10-CM | POA: Diagnosis not present

## 2012-05-15 DIAGNOSIS — R071 Chest pain on breathing: Secondary | ICD-10-CM | POA: Diagnosis not present

## 2012-05-15 DIAGNOSIS — R109 Unspecified abdominal pain: Secondary | ICD-10-CM | POA: Diagnosis not present

## 2012-05-15 DIAGNOSIS — I10 Essential (primary) hypertension: Secondary | ICD-10-CM | POA: Diagnosis not present

## 2012-05-15 DIAGNOSIS — Z86711 Personal history of pulmonary embolism: Secondary | ICD-10-CM | POA: Insufficient documentation

## 2012-05-15 DIAGNOSIS — Z79899 Other long term (current) drug therapy: Secondary | ICD-10-CM | POA: Diagnosis not present

## 2012-05-15 DIAGNOSIS — Z7901 Long term (current) use of anticoagulants: Secondary | ICD-10-CM | POA: Insufficient documentation

## 2012-05-15 DIAGNOSIS — F39 Unspecified mood [affective] disorder: Secondary | ICD-10-CM | POA: Insufficient documentation

## 2012-05-15 LAB — CBC WITH DIFFERENTIAL/PLATELET
Basophils Relative: 1 % (ref 0–1)
Eosinophils Absolute: 0.7 10*3/uL (ref 0.0–0.7)
Eosinophils Relative: 3 % (ref 0–5)
Hemoglobin: 8 g/dL — ABNORMAL LOW (ref 13.0–17.0)
Lymphs Abs: 3.6 10*3/uL (ref 0.7–4.0)
MCH: 30.9 pg (ref 26.0–34.0)
MCHC: 33.2 g/dL (ref 30.0–36.0)
MCV: 93.1 fL (ref 78.0–100.0)
Monocytes Relative: 13 % — ABNORMAL HIGH (ref 3–12)
Neutrophils Relative %: 66 % (ref 43–77)
Platelets: 459 10*3/uL — ABNORMAL HIGH (ref 150–400)

## 2012-05-15 LAB — COMPREHENSIVE METABOLIC PANEL
Albumin: 3.9 g/dL (ref 3.5–5.2)
Alkaline Phosphatase: 79 U/L (ref 39–117)
BUN: 4 mg/dL — ABNORMAL LOW (ref 6–23)
Chloride: 111 mEq/L (ref 96–112)
Creatinine, Ser: 0.58 mg/dL (ref 0.50–1.35)
GFR calc Af Amer: >90 mL/min (ref >90)
GFR calc non Af Amer: >90 mL/min (ref >90)
Glucose, Bld: 117 mg/dL — ABNORMAL HIGH (ref 70–99)
Potassium: 3.3 mEq/L — ABNORMAL LOW (ref 3.5–5.1)
Total Bilirubin: 5.7 mg/dL — ABNORMAL HIGH (ref 0.3–1.2)

## 2012-05-15 LAB — PROTIME-INR
INR: 1.51 — ABNORMAL HIGH (ref 0.00–1.49)
Prothrombin Time: 17.8 seconds — ABNORMAL HIGH (ref 11.6–15.2)

## 2012-05-15 LAB — RETICULOCYTES: RBC.: 2.59 MIL/uL — ABNORMAL LOW (ref 4.22–5.81)

## 2012-05-15 MED ORDER — DEXTROSE 5 % IV SOLN
2000.0000 mg | Freq: Every day | INTRAVENOUS | Status: DC
  Administered 2012-05-15: 2000 mg via INTRAVENOUS
  Filled 2012-05-15 (×2): qty 2

## 2012-05-15 MED ORDER — WARFARIN - PHARMACIST DOSING INPATIENT: Freq: Every day | Status: DC

## 2012-05-15 MED ORDER — POTASSIUM CHLORIDE CRYS ER 20 MEQ PO TBCR
20.0000 meq | EXTENDED_RELEASE_TABLET | Freq: Two times a day (BID) | ORAL | Status: DC
Start: 2012-05-15 — End: 2012-05-16
  Administered 2012-05-15 – 2012-05-16 (×2): 20 meq via ORAL
  Filled 2012-05-15 (×4): qty 1

## 2012-05-15 MED ORDER — POTASSIUM CHLORIDE CRYS ER 20 MEQ PO TBCR
20.0000 meq | EXTENDED_RELEASE_TABLET | Freq: Once | ORAL | Status: AC
  Administered 2012-05-15: 20 meq via ORAL

## 2012-05-15 MED ORDER — HYDROMORPHONE HCL PF 2 MG/ML IJ SOLN
2.0000 mg | INTRAMUSCULAR | Status: DC | PRN
  Administered 2012-05-15 (×2): 4 mg via INTRAVENOUS
  Administered 2012-05-15: 2 mg via INTRAVENOUS
  Administered 2012-05-16 (×5): 4 mg via INTRAVENOUS
  Filled 2012-05-15 (×9): qty 2

## 2012-05-15 MED ORDER — HYDROXYUREA 500 MG PO CAPS
500.0000 mg | ORAL_CAPSULE | Freq: Two times a day (BID) | ORAL | Status: DC
  Administered 2012-05-15 – 2012-05-16 (×2): 500 mg via ORAL
  Filled 2012-05-15 (×5): qty 1

## 2012-05-15 MED ORDER — WARFARIN SODIUM 7.5 MG PO TABS
7.5000 mg | ORAL_TABLET | Freq: Every day | ORAL | Status: DC
  Filled 2012-05-15: qty 1

## 2012-05-15 MED ORDER — KETOROLAC TROMETHAMINE 30 MG/ML IJ SOLN
30.0000 mg | Freq: Four times a day (QID) | INTRAMUSCULAR | Status: DC
  Administered 2012-05-15 – 2012-05-16 (×4): 30 mg via INTRAVENOUS
  Filled 2012-05-15 (×4): qty 1

## 2012-05-15 MED ORDER — ONDANSETRON HCL 4 MG/2ML IJ SOLN: 4.0000 mg | INTRAMUSCULAR | Status: DC | PRN

## 2012-05-15 MED ORDER — DIPHENHYDRAMINE HCL 25 MG PO CAPS: 25.0000 mg | ORAL_CAPSULE | ORAL | Status: DC | PRN

## 2012-05-15 MED ORDER — FOLIC ACID 1 MG PO TABS
1.0000 mg | ORAL_TABLET | Freq: Every morning | ORAL | Status: DC
  Administered 2012-05-16: 1 mg via ORAL
  Filled 2012-05-15: qty 1

## 2012-05-15 MED ORDER — DEXTROSE-NACL 5-0.45 % IV SOLN
INTRAVENOUS | Status: DC
  Administered 2012-05-15 – 2012-05-16 (×2): via INTRAVENOUS

## 2012-05-15 MED ORDER — ONDANSETRON HCL 8 MG PO TABS: 4.0000 mg | ORAL_TABLET | ORAL | Status: DC | PRN

## 2012-05-15 MED ORDER — DIPHENHYDRAMINE HCL 50 MG/ML IJ SOLN: 12.5000 mg | INTRAMUSCULAR | Status: DC | PRN

## 2012-05-15 MED ORDER — LISINOPRIL 10 MG PO TABS
10.0000 mg | ORAL_TABLET | Freq: Every day | ORAL | Status: DC
  Administered 2012-05-15: 10 mg via ORAL
  Filled 2012-05-15 (×2): qty 1

## 2012-05-15 MED ORDER — HYDROMORPHONE HCL PF 2 MG/ML IJ SOLN
4.0000 mg | INTRAMUSCULAR | Status: DC
  Administered 2012-05-15 (×3): 4 mg via INTRAVENOUS
  Filled 2012-05-15 (×2): qty 2

## 2012-05-15 MED ORDER — ZOLPIDEM TARTRATE 5 MG PO TABS: 5.0000 mg | ORAL_TABLET | Freq: Every evening | ORAL | Status: DC | PRN

## 2012-05-15 MED ORDER — SENNOSIDES-DOCUSATE SODIUM 8.6-50 MG PO TABS
1.0000 | ORAL_TABLET | Freq: Two times a day (BID) | ORAL | Status: DC
  Administered 2012-05-15 – 2012-05-16 (×2): 1 via ORAL
  Filled 2012-05-15 (×2): qty 1

## 2012-05-15 MED ORDER — WARFARIN SODIUM 2.5 MG PO TABS
2.5000 mg | ORAL_TABLET | Freq: Once | ORAL | Status: AC
  Administered 2012-05-15: 2.5 mg via ORAL
  Filled 2012-05-15: qty 1

## 2012-05-15 NOTE — H&P (Signed)
[facility] History and Physical   Date: 05/15/2012  Patient name: [name] Medical record number: [id number] Date of birth: 05/24/1980 Age: 32 y.o. Gender: male PCP: [name], [name], MD  Attending physician: Altha Harm, MD  Chief Complaint:  Bilateral pain rib cages     History of Present Illness: 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 He has pain localized to his rib cage area rates his pain 9/10 describes as sharpe, stabbing and constant. No palliative measures.  His pain is located at his ribs and back. He is on chronic anticoagulation. He contributes his crisis to over exerting self for birthday . He denies any fevers, chills, sick contacts, dysuria, difficulty breathing, cough, or SOB.   Meds: Prescriptions prior to admission  Medication Sig Dispense Refill  . diphenhydrAMINE (BENADRYL) 25 mg capsule Take 1-2 capsules (25-50 mg total) by mouth every 4 (four) hours as needed for itching.  30 capsule  0  . folic acid (FOLVITE) 1 MG tablet Take 1 mg by mouth every morning.      Marland Kitchen HYDROmorphone (DILAUDID) 4 MG tablet Take 1-2 tablets (4-8 mg total) by mouth every 4 (four) hours as needed for pain. For pain.  60 tablet  0  . hydroxyurea (HYDREA) 500 MG capsule Take 1 capsule (500 mg total) by mouth 2 (two) times daily. May take with food to minimize GI side effects.  60 capsule  2  . ibuprofen (ADVIL,MOTRIN) 800 MG tablet Take 1 tablet (800 mg total) by mouth every 8 (eight) hours as needed for pain. Do not exceed use for more than 20/30 days  30 tablet  0  . lisinopril (PRINIVIL,ZESTRIL) 10 MG tablet Take 1 tablet (10 mg total) by mouth daily at 10 pm.  30 tablet  2  . potassium chloride SA (K-DUR,KLOR-CON) 20 MEQ tablet Take 1 tablet (20 mEq total) by mouth 2 (two) times daily.  60 tablet  1  . warfarin (COUMADIN) 5 MG tablet Take 7.5 mg by mouth every evening. Take  7.5 every day until 05/10/12 next appointment      . zolpidem (AMBIEN) 10 MG tablet Take 1 tablet (10 mg total) by mouth at bedtime as needed. For sleep  30 tablet  0    Allergies: Morphine and related Past Medical History  Diagnosis Date  . Sickle cell anemia   . Blood transfusion   . 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 Hypokalemia   . Mood disorder   . Pulmonary embolism   . Avascular necrosis   . Leukocytosis     Chronic  . Thrombocytosis     Chronic  . Hypertension    Past Surgical History  Procedure Laterality Date  . Right hip replacement      08/2006  . Cholecystectomy      01/2008  . Porta cath placement    . Porta cath removal    . Umbilical hernia repair      01/2008  . Excision of left periauricular cyst      10/2009  . Excision of right ear lobe cyst with primary closur      11/2007  . Portacath placement  01/05/2012    Procedure: INSERTION PORT-A-CATH;  Surgeon: Adolph Pollack, MD;  Location: [facility] OR;  Service: General;  Laterality: N/A;  ultrasound guiced port a cath insertion with fluoroscopy  Family History  Problem Relation Age of Onset  . Sickle cell anemia Mother   . Sickle cell anemia Father   . Sickle cell trait Brother    History   Social History  . Marital Status: Single    Spouse Name: N/A    Number of Children: 0  . Years of Education: 13   Occupational History  . Unemployed    Social History Main Topics  . Smoking status: Former Smoker -- 13 years    Types: Cigarettes    Quit date: 07/08/2010  . Smokeless tobacco: Never Used  . Alcohol Use: No  . Drug Use: No     Comment: quit smoking 2011  . Sexually Active: Yes    Birth Control/ Protection: None   Other Topics Concern  . Not on file   Social History Narrative   Lives in an apartment.  Single.  Lives with girlfriend.  Does not use any assist devices.        [name]:  [phone] Mom, emergency contact    Review of Systems: A  comprehensive review of systems was negative except for: Musculoskeletal: positive for bilateral rib cage   Physical Exam: Blood pressure 111/65, pulse 78, temperature 98.8 F (37.1 C), temperature source Oral, resp. rate 20, height 6' (1.829 m), weight 77.111 kg (170 lb). BP 128/76  Pulse 99  Temp(Src) 97.7 F (36.5 C) (Oral)  Resp 20  SpO2 94%  General Appearance:    Alert, cooperative, no distress, appears stated age  Head:    Normocephalic, without obvious abnormality, atraumatic  Eyes:    PERRL, iscerla , EOM's intact, fundi    benign, both eyes       Ears:    Normal TM's and external ear canals, both ears  Nose:   Nares normal, septum midline, mucosa normal, no drainage    or sinus tenderness  Throat:   Lips, mucosa, and tongue normal; teeth and gums normal  Neck:   Supple, symmetrical, trachea midline, no adenopathy;       thyroid:  No enlargement/tenderness/nodules; no carotid   bruit or JVD  Back:     Symmetric, no curvature, ROM normal, no CVA tenderness  Lungs:     Clear to auscultation bilaterally, respirations unlabored  Chest wall:    No tenderness or deformity  Heart:    Regular rate and rhythm, S1 and S2 normal, no murmur, rub   or gallop  Abdomen:     Soft, non-tender, bowel sounds active all four quadrants,    no masses, no organomegaly  Genitalia:    Denies priapism      Extremities:   Extremities normal, atraumatic, no cyanosis or edema  Pulses:   2+ and symmetric all extremities  Skin:   Skin color, texture, turgor normal, no rashes or lesions, numerous tatoos  Lymph nodes:   Cervical, supraclavicular, and axillary nodes normal  Neurologic:   CNII-XII intact. Normal strength, sensation and reflexes      throughout    Lab results: No results found for this or any previous visit (from the past 24 hour(s)).  Imaging results:  No results found.   Assessment & Plan:  Vaso occlusive crisis/ Pain : Chronic likely caused by hyperviscosity, sticky blood  vessel and decreased oxygen carrying capacity. Treatment /plan is hydromorphone 4mg  Q 2hrs scheduled x's 3 than Q 2 hrs hydromorphone 2-4 mg prn.Adjunction added Toradol 30mg  Q 6hrs for inflammatory/reactive process.  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 Toradol 30mg  Q 6 hrs for this inflammatory process.  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 Monitor.  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  Chronic anticoagulation: PT/INR will ck last Thurs and dose adjusted to 7.5 daily will have pharmacy to evaluate and follow up  [name], [name] 05/15/2012, 12:11 PM

## 2012-05-15 NOTE — Progress Notes (Signed)
ANTICOAGULATION CONSULT NOTE - Initial Consult  Pharmacy Consult for Coumadin Indication: Hx DVT/PE  Allergies  Allergen Reactions  . Morphine And Related Hives and Rash    Pt states he is able to tolerate Dilaudid with no reactions.    Patient Measurements:  72.5kg   Vital Signs: Temp: 97.5 F (36.4 C) (04/15 1415) Temp src: Oral (04/15 1415) BP: 117/74 mmHg (04/15 1415) Pulse Rate: 94 (04/15 1415)  Labs:  Recent Labs  05/13/12 1149 05/15/12 1219 05/15/12 1410  HGB 8.1* 8.0*  --   HCT 23.9* 24.1*  --   PLT 518* 459*  --   LABPROT 15.6*  --   --   INR 1.27  --   --   CREATININE 0.53  --  0.58    The CrCl is unknown because both a height and weight (above a minimum accepted value) are required for this calculation.   Medical History: Past Medical History  Diagnosis Date  . Sickle cell anemia   . Blood transfusion   . 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 Hypokalemia   . Mood disorder   . Pulmonary embolism   . Avascular necrosis   . Leukocytosis     Chronic  . Thrombocytosis     Chronic  . Hypertension     Medications:  Prescriptions prior to admission  Medication Sig Dispense Refill  . diphenhydrAMINE (BENADRYL) 25 mg capsule Take 1-2 capsules (25-50 mg total) by mouth every 4 (four) hours as needed for itching.  30 capsule  0  . folic acid (FOLVITE) 1 MG tablet Take 1 mg by mouth every morning.      Marland Kitchen HYDROmorphone (DILAUDID) 4 MG tablet Take 1-2 tablets (4-8 mg total) by mouth every 4 (four) hours as needed for pain. For pain.  60 tablet  0  . hydroxyurea (HYDREA) 500 MG capsule Take 1 capsule (500 mg total) by mouth 2 (two) times daily. May take with food to minimize GI side effects.  60 capsule  2  . ibuprofen (ADVIL,MOTRIN) 800 MG tablet Take 1 tablet (800 mg total) by mouth every 8 (eight) hours as needed for pain. Do not exceed use for more than 20/30 days  30 tablet  0  . lisinopril (PRINIVIL,ZESTRIL) 10 MG tablet  Take 1 tablet (10 mg total) by mouth daily at 10 pm.  30 tablet  2  . potassium chloride SA (K-DUR,KLOR-CON) 20 MEQ tablet Take 1 tablet (20 mEq total) by mouth 2 (two) times daily.  60 tablet  1  . warfarin (COUMADIN) 5 MG tablet Take 7.5 mg by mouth every evening. Take 7.5 every day until 05/10/12 next appointment      . zolpidem (AMBIEN) 10 MG tablet Take 1 tablet (10 mg total) by mouth at bedtime as needed. For sleep  30 tablet  0    Assessment: 32 yom w/ hx PE/DVT on chronic coumadin PTA. Pt adm to [facility] center 4/15 for acute pain crisis. Pharmacy asked to dose coumadin. Dose PTA 7.5mg  daily, reportedly took dose 4/15 before admission  INR currently pending, has been subtherapeutic previous admissions  No bleeding reported  Goal of Therapy:  INR 2-3   Plan:  INR now and daily Will f/u INR when available Defer need for Lovenox bridge to MD in event it is subtx  Gwen Her PharmD  [phone] 05/15/2012 3:25 PM

## 2012-05-15 NOTE — Progress Notes (Signed)
Anticoagulation Notes - Follow Up  Consult: Coumadin Indication: H/o PE/DVT PTA dose: 7.5mg  daily (last dose 4/15)  Please see previous note by Gwen Her for more details.  INR returns as 1.51. Confirmed with patient, pt took a dose of Coumadin 7.5mg  this morning  Plan: Coumadin 2.5 mg as boosted dose tonight. Daily PT/INR, pharmacy will f/u  Geoffry Paradise, PharmD, BCPS Pager: [phone] 4:24 PM Pharmacy #: [id number]

## 2012-05-16 ENCOUNTER — Non-Acute Institutional Stay (HOSPITAL_COMMUNITY): Payer: Medicare Other

## 2012-05-16 DIAGNOSIS — R109 Unspecified abdominal pain: Secondary | ICD-10-CM | POA: Diagnosis not present

## 2012-05-16 DIAGNOSIS — D5700 Hb-SS disease with crisis, unspecified: Secondary | ICD-10-CM | POA: Diagnosis not present

## 2012-05-16 DIAGNOSIS — J42 Unspecified chronic bronchitis: Secondary | ICD-10-CM | POA: Diagnosis not present

## 2012-05-16 LAB — CBC WITH DIFFERENTIAL/PLATELET
Basophils Absolute: 0.2 10*3/uL — ABNORMAL HIGH (ref 0.0–0.1)
Basophils Relative: 1 % (ref 0–1)
Eosinophils Absolute: 1 10*3/uL — ABNORMAL HIGH (ref 0.0–0.7)
Lymphocytes Relative: 19 % (ref 12–46)
MCH: 32.1 pg (ref 26.0–34.0)
MCHC: 34.6 g/dL (ref 30.0–36.0)
Monocytes Absolute: 2.8 10*3/uL — ABNORMAL HIGH (ref 0.1–1.0)
Neutrophils Relative %: 61 % (ref 43–77)
Platelets: 458 10*3/uL — ABNORMAL HIGH (ref 150–400)
RDW: 20.5 % — ABNORMAL HIGH (ref 11.5–15.5)

## 2012-05-16 LAB — COMPREHENSIVE METABOLIC PANEL
Albumin: 3.5 g/dL (ref 3.5–5.2)
BUN: 4 mg/dL — ABNORMAL LOW (ref 6–23)
Calcium: 8.4 mg/dL (ref 8.4–10.5)
Chloride: 108 mEq/L (ref 96–112)
Creatinine, Ser: 0.54 mg/dL (ref 0.50–1.35)
GFR calc non Af Amer: >90 mL/min (ref >90)
Total Bilirubin: 3.4 mg/dL — ABNORMAL HIGH (ref 0.3–1.2)

## 2012-05-16 MED ORDER — HEPARIN SOD (PORK) LOCK FLUSH 100 UNIT/ML IV SOLN
500.0000 [IU] | INTRAVENOUS | Status: AC | PRN
  Administered 2012-05-16: 500 [IU]
  Filled 2012-05-16: qty 5

## 2012-05-16 MED ORDER — POTASSIUM CHLORIDE CRYS ER 20 MEQ PO TBCR
20.0000 meq | EXTENDED_RELEASE_TABLET | Freq: Two times a day (BID) | ORAL | Status: DC
  Administered 2012-05-16: 20 meq via ORAL

## 2012-05-16 MED ORDER — SODIUM CHLORIDE 0.9 % IJ SOLN
10.0000 mL | INTRAMUSCULAR | Status: AC | PRN
  Administered 2012-05-16: 10 mL

## 2012-05-16 NOTE — Discharge Summary (Signed)
[facility] Discharge Summary   Patient ID: MAYFORD ALBERG MRN: [id number] DOB/AGE: 1980-09-16 32 y.o.  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     folic acid 1 MG tablet  Commonly known as:  FOLVITE  Take 1 mg by mouth every morning.     HYDROmorphone 4 MG tablet  Commonly known as:  DILAUDID  Take 1-2 tablets (4-8 mg total) by mouth every 4 (four) hours as needed for pain. For pain.     hydroxyurea 500 MG capsule  Commonly known as:  HYDREA  Take 1 capsule (500 mg total) by mouth 2 (two) times daily. May take with food to minimize GI side effects.     ibuprofen 800 MG tablet  Commonly known as:  ADVIL,MOTRIN  Take 1 tablet (800 mg total) by mouth every 8 (eight) hours as needed for pain. Do not exceed use for more than 20/30 days     lisinopril 10 MG tablet  Commonly known as:  PRINIVIL,ZESTRIL  Take 1 tablet (10 mg total) by mouth daily at 10 pm.     potassium chloride SA 20 MEQ tablet  Commonly known as:  K-DUR,KLOR-CON  Take 1 tablet (20 mEq total) by mouth 2 (two) times daily.     warfarin 5 MG tablet  Commonly known as:  COUMADIN  Take 7.5 mg by mouth every evening. Take 7.5 every day until 05/10/12 next  appointment     zolpidem 10 MG tablet  Commonly known as:  AMBIEN  Take 1 tablet (10 mg total) by mouth at bedtime as needed. For sleep         Consults:  Pharmacy   Significant Diagnostic Studies:  Dg Chest 2 View  04/06/2012  *RADIOLOGY REPORT*  Clinical Data: Chest pain.  Sickle cell crisis.  CHEST - 2 VIEW  Comparison: Radiographs 03/28/2012.  CT 03/01/2012.  Findings: The left subclavian power port is unchanged in the lower SVC.  Heart size and mediastinal contours are stable.  Chronic central airway thickening at both lung bases appears stable.  There is no superimposed airspace disease, pleural effusion or pneumothorax.  IMPRESSION: Stable chronic lung disease.  No acute cardiopulmonary process identified.   Original Report Authenticated By: Carey Bullocks, M.D.    Dg Chest 2 View  03/28/2012  *RADIOLOGY REPORT*  Clinical Data: Dyspnea on exertion  CHEST - 2 VIEW  Comparison: 03/17/2012  Findings: Left chest wall porta-catheter is noted with tip in the projection of the cavoatrial junction.  There is mild cardiac enlargement.  There is no pleural effusion or edema.  No airspace consolidation noted.  IMPRESSION:  1.  No acute cardiopulmonary abnormalities.   Original Report Authenticated By: Signa Kell, M.D.      [facility] Course:  For complete details please refer to admission  H and P, but in brief, Mr. Melven Stockard is a 32 year old African American male with SS genotype sickle cell and hx of PE/DVT on chronic anticoagulation. He was recently seen and d/c from ED on 05/08/12 and 05/13/12.He has pain localized to his rib cage area rates his pain 9/10 describes as sharpe, stabbing and constant. No palliative measures. His pain is located at his ribs and back. He is on chronic anticoagulation. He was treated aggressively with D5 1/2 IV fluids for hydration , scheduled IV analgesic 4mg  Q 2hrs for initial dosing x's 3 than Q 2hrs prn 2-4 mg dependent upon pain  assessment,,Adjunction added Toradol 30mg  Q 6hrs for inflammatory/reactive process. This is secondary to bone marrow turn over and inflammatory process component. Toradol 30mg  Q 6 hrs for this inflammatory process. (chest x-ray ordered to r/o respiratory component)  Home meds resumed chronic constipation senokot s  And PT/INR pharmacy consulted and dosed.Chest x-rayed reviewed-Chronic bronchitic and minimal bibasilar changes.  No acute abnormalities.     Physical Exam at Discharge:  BP 100/64  Pulse 75  Temp(Src) 98.6 F (37 C) (Oral)  Resp 18  Ht 6' (1.829 m)  Wt 77.111 kg (170 lb)  BMI 23.05 kg/m2  SpO2 98%  ZOX:WRUEA, cooperative, no distress, appears stated age  Cardiovascular:Regular rate and rhythm, S1 and S2 normal, no murmur, rub or gallop  Respiratory:Clear to auscultation bilaterally, respirations unlabored, No vocal fremitus  Gastrointestinal:Soft, non-tender, bowel sounds active all four quadrants,  no masses, no organomegaly  Extremities:Extremities normal, atraumatic, no cyanosis or edema  Pulses: 2+ and symmetric all extremities   Disposition at Discharge: 01-Home or Self Care  Discharge Orders:  Future Appointments Provider Department Dept Phone   06/06/2012 11:00 AM [facility] [phone]    Continue Coumadin 7.5 mg  check PT/INR in 1 week Condition at Discharge:   Stable  Time spent on Discharge:  Greater than 30 minutes.  SignedRanda Evens, [name] 05/16/2012, 2:54 PM

## 2012-05-16 NOTE — Care Management (Addendum)
Patient:  [name]    Date Initiated: 05/16/2012   Documentation initiated by: Karoline Caldwell RN, BSN, BS Subjective/Objective Assessment:  32yo male [facility] for sickle cell pain crisis. Patient being seen for 23 hour observation. PTA pt came from home. This CM spoke with Brittney at [employer] to attain Prior authorization# [id number] Morphine Sulfate 15mg  BID 60 tabs for 30 days, [name], NP notified. Also, this CM faxed change of provider name to [place] [employer]. Patient does not have any additional needs at this time. CM will continue to follow  Action/Plan:  Home when stable   Anticipated DC Date: 05/16/12

## 2012-05-16 NOTE — Progress Notes (Signed)
Patient ID: [name], male   DOB: 22-Jan-1981, 32 y.o.   MRN: [id number] Discharge instructions given to patient, along with follow up information.  Port flushed and de accessed per protocol.  Questions answered, belongings returned.

## 2012-05-17 ENCOUNTER — Telehealth (HOSPITAL_COMMUNITY): Payer: Self-pay

## 2012-05-17 NOTE — Telephone Encounter (Signed)
Case Management Note: Called patient for follow-up call, however unable to leave voice mail due to his voice mail box has not been set up yet. CM will continue to follow.   Karoline Caldwell RN, BSN, [place]   [phone]

## 2012-05-21 ENCOUNTER — Telehealth (HOSPITAL_COMMUNITY): Payer: Self-pay

## 2012-05-21 NOTE — Telephone Encounter (Signed)
Case Management Note: This CM spoke with patient to confirm he was able to obtain his medication after his discharge from 23 hour observation on 05/16/12. Patient stated" I was able to get it the same day with no problems. I'm good." No additional needs at this time.   Karoline Caldwell RN, BSN, [place] [phone]

## 2012-05-23 NOTE — H&P (Signed)
Pt seen and examined and discussed with NP Temitayo Covalt [name]. Agree with assessment and plan. 

## 2012-05-23 NOTE — Discharge Summary (Signed)
Pt seen and examined and discussed with NP Brynlie Daza [name]. Agree with assessment and plan. 

## 2012-05-25 ENCOUNTER — Emergency Department (HOSPITAL_COMMUNITY)
Admission: EM | Admit: 2012-05-25 | Discharge: 2012-05-26 | Disposition: A | Discharge status: Home / Self Care | Payer: Medicare Other | Attending: Emergency Medicine | Admitting: Emergency Medicine

## 2012-05-25 DIAGNOSIS — Z862 Personal history of diseases of the blood and blood-forming organs and certain disorders involving the immune mechanism: Secondary | ICD-10-CM | POA: Diagnosis not present

## 2012-05-25 DIAGNOSIS — Z8659 Personal history of other mental and behavioral disorders: Secondary | ICD-10-CM | POA: Insufficient documentation

## 2012-05-25 DIAGNOSIS — D5700 Hb-SS disease with crisis, unspecified: Secondary | ICD-10-CM

## 2012-05-25 DIAGNOSIS — Z8739 Personal history of other diseases of the musculoskeletal system and connective tissue: Secondary | ICD-10-CM | POA: Insufficient documentation

## 2012-05-25 DIAGNOSIS — Z79899 Other long term (current) drug therapy: Secondary | ICD-10-CM | POA: Insufficient documentation

## 2012-05-25 DIAGNOSIS — I10 Essential (primary) hypertension: Secondary | ICD-10-CM | POA: Insufficient documentation

## 2012-05-25 DIAGNOSIS — Z8679 Personal history of other diseases of the circulatory system: Secondary | ICD-10-CM | POA: Insufficient documentation

## 2012-05-25 DIAGNOSIS — Z87891 Personal history of nicotine dependence: Secondary | ICD-10-CM | POA: Diagnosis not present

## 2012-05-25 DIAGNOSIS — Z86711 Personal history of pulmonary embolism: Secondary | ICD-10-CM | POA: Insufficient documentation

## 2012-05-25 DIAGNOSIS — M545 Low back pain, unspecified: Secondary | ICD-10-CM | POA: Insufficient documentation

## 2012-05-25 DIAGNOSIS — R071 Chest pain on breathing: Secondary | ICD-10-CM | POA: Insufficient documentation

## 2012-05-25 DIAGNOSIS — Z8639 Personal history of other endocrine, nutritional and metabolic disease: Secondary | ICD-10-CM | POA: Insufficient documentation

## 2012-05-25 MED ORDER — SODIUM CHLORIDE 0.9 % IV BOLUS (SEPSIS)
1000.0000 mL | Freq: Once | INTRAVENOUS | Status: AC
Start: 2012-05-25 — End: 2012-05-26
  Administered 2012-05-25: 1000 mL via INTRAVENOUS

## 2012-05-25 MED ORDER — DIPHENHYDRAMINE HCL 50 MG/ML IJ SOLN
25.0000 mg | Freq: Once | INTRAMUSCULAR | Status: AC
  Administered 2012-05-25: 25 mg via INTRAVENOUS
  Filled 2012-05-25: qty 1

## 2012-05-25 MED ORDER — HYDROMORPHONE HCL PF 2 MG/ML IJ SOLN
2.0000 mg | INTRAMUSCULAR | Status: DC | PRN
  Administered 2012-05-25: 2 mg via INTRAVENOUS
  Filled 2012-05-25: qty 1

## 2012-05-25 NOTE — ED Notes (Addendum)
Pt has hx of sickle cells stated stated having pain in rib cage and back since 5am this morning.

## 2012-05-25 NOTE — ED Notes (Signed)
Patient states this he has a hx of sickle cell crisis. Patient states symptoms are similar to his regular sickle cell pain. Pain in back and rib cage.

## 2012-05-25 NOTE — ED Provider Notes (Signed)
History     CSN: [id number]  Arrival date & time 05/25/12  2244   First MD Initiated Contact with Patient 05/25/12 2322      No chief complaint on file.   (Consider location/radiation/quality/duration/timing/severity/associated sxs/prior treatment) HPI Comments: [name] is a 32 year old, African American gentleman, with a history of sickle cell anemia, with frequent ED visits for pain control.  He, states, that he has elongated, and morphine tablets at home, and he took tablets at 6 AM and at 2 PM this, afternoon, went to sleep, and when he woke up he still had pain.   He decided to come to the emergency room for evaluation.  Pain is in his ribs, and lower back, which is consistent with his past  pain crisis  The history is provided by the patient.    Past Medical History  Diagnosis Date  . Sickle cell anemia   . Blood transfusion   . 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 Hypokalemia   . Mood disorder   . Pulmonary embolism   . Avascular necrosis   . Leukocytosis     Chronic  . Thrombocytosis     Chronic  . Hypertension     Past Surgical History  Procedure Laterality Date  . Right hip replacement      08/2006  . Cholecystectomy      01/2008  . Porta cath placement    . Porta cath removal    . Umbilical hernia repair      01/2008  . Excision of left periauricular cyst      10/2009  . Excision of right ear lobe cyst with primary closur      11/2007  . Portacath placement  01/05/2012    Procedure: INSERTION PORT-A-CATH;  Surgeon: Adolph Pollack, MD;  Location: [facility] OR;  Service: General;  Laterality: N/A;  ultrasound guiced port a cath insertion with fluoroscopy    Family History  Problem Relation Age of Onset  . Sickle cell anemia Mother   . Sickle cell anemia Father   . Sickle cell trait Brother     History  Substance Use Topics  . Smoking status: Former Smoker -- 13 years    Types: Cigarettes    Quit date: 07/08/2010  .  Smokeless tobacco: Never Used  . Alcohol Use: No      Review of Systems  Constitutional: Negative for fever and chills.  Respiratory: Negative for cough and shortness of breath.   Cardiovascular: Positive for chest pain.  Gastrointestinal: Negative for nausea.  Endocrine: Negative for cold intolerance.  Genitourinary: Negative for decreased urine volume.  Musculoskeletal: Positive for back pain.  Skin: Negative for rash.  Neurological: Negative for dizziness and headaches.  All other systems reviewed and are negative.    Allergies  Morphine and related  Home Medications   Current Outpatient Rx  Name  Route  Sig  Dispense  Refill  . diphenhydrAMINE (BENADRYL) 25 mg capsule   Oral   Take 1-2 capsules (25-50 mg total) by mouth every 4 (four) hours as needed for itching.   30 capsule   0   . folic acid (FOLVITE) 1 MG tablet   Oral   Take 1 mg by mouth every morning.         Marland Kitchen HYDROmorphone (DILAUDID) 4 MG tablet   Oral   Take 1-2 tablets (4-8 mg total) by mouth every 4 (four) hours as needed for pain. For pain.  60 tablet   0   . hydroxyurea (HYDREA) 500 MG capsule   Oral   Take 1 capsule (500 mg total) by mouth 2 (two) times daily. May take with food to minimize GI side effects.   60 capsule   2   . ibuprofen (ADVIL,MOTRIN) 800 MG tablet   Oral   Take 1 tablet (800 mg total) by mouth every 8 (eight) hours as needed for pain. Do not exceed use for more than 20/30 days   30 tablet   0   . lisinopril (PRINIVIL,ZESTRIL) 10 MG tablet   Oral   Take 1 tablet (10 mg total) by mouth daily at 10 pm.   30 tablet   2   . potassium chloride SA (K-DUR,KLOR-CON) 20 MEQ tablet   Oral   Take 1 tablet (20 mEq total) by mouth 2 (two) times daily.   60 tablet   1   . warfarin (COUMADIN) 5 MG tablet   Oral   Take 7.5 mg by mouth every evening. Take 7.5 every day until 05/10/12 next appointment         . zolpidem (AMBIEN) 10 MG tablet   Oral   Take 1 tablet (10 mg  total) by mouth at bedtime as needed. For sleep   30 tablet   0     BP 116/81  Pulse 67  Temp(Src) 98.5 F (36.9 C) (Oral)  SpO2 94%  Physical Exam  Constitutional: He is oriented to person, place, and time. He appears well-developed and well-nourished.  HENT:  Head: Normocephalic.  Eyes: Pupils are equal, round, and reactive to light.  Neck: Normal range of motion.  Cardiovascular: Normal rate.   Pulmonary/Chest: Effort normal.  Abdominal: Soft. He exhibits no distension.  Musculoskeletal: Normal range of motion.  Neurological: He is alert and oriented to person, place, and time.  Skin: Skin is warm. No pallor.    ED Course  Procedures (including critical care time)  Labs Reviewed  CBC WITH DIFFERENTIAL - Abnormal; Notable for the following:    WBC 18.4 (*)    RBC 2.37 (*)    Hemoglobin 7.6 (*)    HCT 21.9 (*)    RDW 22.4 (*)    Platelets 453 (*)    Neutro Abs 12.1 (*)    Monocytes Absolute 1.9 (*)    Eosinophils Absolute 0.9 (*)    All other components within normal limits  COMPREHENSIVE METABOLIC PANEL - Abnormal; Notable for the following:    Potassium 3.0 (*)    Glucose, Bld 101 (*)    BUN 5 (*)    Total Bilirubin 4.7 (*)    All other components within normal limits  RETICULOCYTES - Abnormal; Notable for the following:    Retic Ct Pct 16.5 (*)    RBC. 2.37 (*)    Retic Count, Manual 391.1 (*)    All other components within normal limits  PROTIME-INR - Abnormal; Notable for the following:    Prothrombin Time 15.6 (*)    All other components within normal limits   No results found.   1. Sickle cell pain crisis       MDM   Labs reviewed, potassium, noted to be low supplemented with 40 mEq by mouth After multiple doses of Dilaudid IV, Benadryl, and IV fluid.  Patient states, that he thinks he can manage his pain.  At home, will follow up with his primary care physician         Arman Filter,  NP 05/26/12 0446

## 2012-05-26 LAB — PROTIME-INR: INR: 1.27 (ref 0.00–1.49)

## 2012-05-26 LAB — COMPREHENSIVE METABOLIC PANEL
AST: 23 U/L (ref 0–37)
Albumin: 3.8 g/dL (ref 3.5–5.2)
Alkaline Phosphatase: 76 U/L (ref 39–117)
BUN: 5 mg/dL — ABNORMAL LOW (ref 6–23)
CO2: 22 mEq/L (ref 19–32)
Chloride: 106 mEq/L (ref 96–112)
Creatinine, Ser: 0.72 mg/dL (ref 0.50–1.35)
GFR calc non Af Amer: >90 mL/min (ref >90)
Potassium: 3 mEq/L — ABNORMAL LOW (ref 3.5–5.1)
Total Bilirubin: 4.7 mg/dL — ABNORMAL HIGH (ref 0.3–1.2)

## 2012-05-26 LAB — CBC WITH DIFFERENTIAL/PLATELET
Basophils Absolute: 0.1 10*3/uL (ref 0.0–0.1)
Basophils Relative: 1 % (ref 0–1)
Eosinophils Absolute: 0.9 10*3/uL — ABNORMAL HIGH (ref 0.0–0.7)
Eosinophils Relative: 5 % (ref 0–5)
Lymphs Abs: 3.4 10*3/uL (ref 0.7–4.0)
MCH: 32.1 pg (ref 26.0–34.0)
MCHC: 34.7 g/dL (ref 30.0–36.0)
MCV: 92.4 fL (ref 78.0–100.0)
Platelets: 453 10*3/uL — ABNORMAL HIGH (ref 150–400)
RBC: 2.37 MIL/uL — ABNORMAL LOW (ref 4.22–5.81)
RDW: 22.4 % — ABNORMAL HIGH (ref 11.5–15.5)

## 2012-05-26 LAB — RETICULOCYTES: RBC.: 2.37 MIL/uL — ABNORMAL LOW (ref 4.22–5.81)

## 2012-05-26 MED ORDER — HYDROMORPHONE HCL PF 2 MG/ML IJ SOLN
2.0000 mg | INTRAMUSCULAR | Status: DC
  Administered 2012-05-26: 2 mg via INTRAVENOUS
  Filled 2012-05-26: qty 1

## 2012-05-26 MED ORDER — HEPARIN SOD (PORK) LOCK FLUSH 100 UNIT/ML IV SOLN
INTRAVENOUS | Status: AC
  Administered 2012-05-26: 500 [IU]
  Filled 2012-05-26: qty 5

## 2012-05-26 MED ORDER — POTASSIUM CHLORIDE CRYS ER 20 MEQ PO TBCR
40.0000 meq | EXTENDED_RELEASE_TABLET | Freq: Once | ORAL | Status: AC
  Administered 2012-05-26: 40 meq via ORAL
  Filled 2012-05-26: qty 2

## 2012-05-26 NOTE — ED Provider Notes (Signed)
Medical screening examination/treatment/procedure(s) were performed by non-physician practitioner and as supervising physician I was immediately available for consultation/collaboration.  Jones Skene, M.D.  Jones Skene, MD 05/26/12 [phone]

## 2012-05-26 NOTE — ED Notes (Signed)
Patient requesting pain medication. Patient resting comfortably in NAD at this time.

## 2012-05-29 ENCOUNTER — Emergency Department (HOSPITAL_COMMUNITY): Payer: Medicare Other

## 2012-05-29 ENCOUNTER — Encounter (HOSPITAL_COMMUNITY): Payer: Self-pay | Admitting: *Deleted

## 2012-05-29 ENCOUNTER — Inpatient Hospital Stay (HOSPITAL_COMMUNITY)
Admission: EM | Admit: 2012-05-29 | Discharge: 2012-06-05 | DRG: 812 | Disposition: A | Discharge status: Home / Self Care | Payer: Medicare Other | Attending: Internal Medicine | Admitting: Internal Medicine

## 2012-05-29 DIAGNOSIS — Z7901 Long term (current) use of anticoagulants: Secondary | ICD-10-CM

## 2012-05-29 DIAGNOSIS — R791 Abnormal coagulation profile: Secondary | ICD-10-CM | POA: Diagnosis not present

## 2012-05-29 DIAGNOSIS — Z79899 Other long term (current) drug therapy: Secondary | ICD-10-CM

## 2012-05-29 DIAGNOSIS — I2782 Chronic pulmonary embolism: Secondary | ICD-10-CM | POA: Diagnosis present

## 2012-05-29 DIAGNOSIS — Z87891 Personal history of nicotine dependence: Secondary | ICD-10-CM | POA: Diagnosis not present

## 2012-05-29 DIAGNOSIS — Z96649 Presence of unspecified artificial hip joint: Secondary | ICD-10-CM

## 2012-05-29 DIAGNOSIS — D5700 Hb-SS disease with crisis, unspecified: Secondary | ICD-10-CM | POA: Diagnosis not present

## 2012-05-29 DIAGNOSIS — D571 Sickle-cell disease without crisis: Secondary | ICD-10-CM

## 2012-05-29 DIAGNOSIS — J42 Unspecified chronic bronchitis: Secondary | ICD-10-CM | POA: Diagnosis not present

## 2012-05-29 DIAGNOSIS — D72829 Elevated white blood cell count, unspecified: Secondary | ICD-10-CM | POA: Diagnosis present

## 2012-05-29 DIAGNOSIS — M87029 Idiopathic aseptic necrosis of unspecified humerus: Secondary | ICD-10-CM | POA: Diagnosis present

## 2012-05-29 DIAGNOSIS — I10 Essential (primary) hypertension: Secondary | ICD-10-CM | POA: Diagnosis not present

## 2012-05-29 DIAGNOSIS — K5900 Constipation, unspecified: Secondary | ICD-10-CM | POA: Diagnosis not present

## 2012-05-29 DIAGNOSIS — F39 Unspecified mood [affective] disorder: Secondary | ICD-10-CM | POA: Diagnosis not present

## 2012-05-29 DIAGNOSIS — E876 Hypokalemia: Secondary | ICD-10-CM | POA: Diagnosis not present

## 2012-05-29 DIAGNOSIS — R109 Unspecified abdominal pain: Secondary | ICD-10-CM | POA: Diagnosis not present

## 2012-05-29 DIAGNOSIS — D473 Essential (hemorrhagic) thrombocythemia: Secondary | ICD-10-CM | POA: Diagnosis present

## 2012-05-29 LAB — HEPATIC FUNCTION PANEL
Albumin: 3.8 g/dL (ref 3.5–5.2)
Alkaline Phosphatase: 74 U/L (ref 39–117)
Indirect Bilirubin: 5.8 mg/dL — ABNORMAL HIGH (ref 0.3–0.9)
Total Protein: 7.2 g/dL (ref 6.0–8.3)

## 2012-05-29 LAB — BASIC METABOLIC PANEL
BUN: 5 mg/dL — ABNORMAL LOW (ref 6–23)
CO2: 20 mEq/L (ref 19–32)
Chloride: 109 mEq/L (ref 96–112)
GFR calc non Af Amer: >90 mL/min (ref >90)
Glucose, Bld: 111 mg/dL — ABNORMAL HIGH (ref 70–99)
Potassium: 2.9 mEq/L — ABNORMAL LOW (ref 3.5–5.1)
Sodium: 140 mEq/L (ref 135–145)

## 2012-05-29 LAB — CBC WITH DIFFERENTIAL/PLATELET
Basophils Relative: 1 % (ref 0–1)
Eosinophils Relative: 4 % (ref 0–5)
HCT: 21.5 % — ABNORMAL LOW (ref 39.0–52.0)
Hemoglobin: 7.6 g/dL — ABNORMAL LOW (ref 13.0–17.0)
Lymphocytes Relative: 12 % (ref 12–46)
Lymphs Abs: 2.4 10*3/uL (ref 0.7–4.0)
MCV: 89.6 fL (ref 78.0–100.0)
Monocytes Relative: 12 % (ref 3–12)
Neutro Abs: 14.2 10*3/uL — ABNORMAL HIGH (ref 1.7–7.7)
WBC: 20 10*3/uL — ABNORMAL HIGH (ref 4.0–10.5)

## 2012-05-29 LAB — RETICULOCYTES
RBC.: 2.4 MIL/uL — ABNORMAL LOW (ref 4.22–5.81)
Retic Count, Absolute: 417.6 10*3/uL — ABNORMAL HIGH (ref 19.0–186.0)

## 2012-05-29 LAB — URINALYSIS, ROUTINE W REFLEX MICROSCOPIC
Nitrite: NEGATIVE
Specific Gravity, Urine: 1.013 (ref 1.005–1.030)
Urobilinogen, UA: 1 mg/dL (ref 0.0–1.0)

## 2012-05-29 LAB — URINE MICROSCOPIC-ADD ON

## 2012-05-29 LAB — PROTIME-INR: Prothrombin Time: 15.6 seconds — ABNORMAL HIGH (ref 11.6–15.2)

## 2012-05-29 IMAGING — CR DG CHEST 2V
2 series · 2 of 2 positions shown · non-contrast
Comparison: 04/20/2009.

CLINICAL DATA: Sickle cell crisis.  Weakness.  Myalgias.  Shortness
of breath.

CHEST - 2 VIEW

[w chest pa]
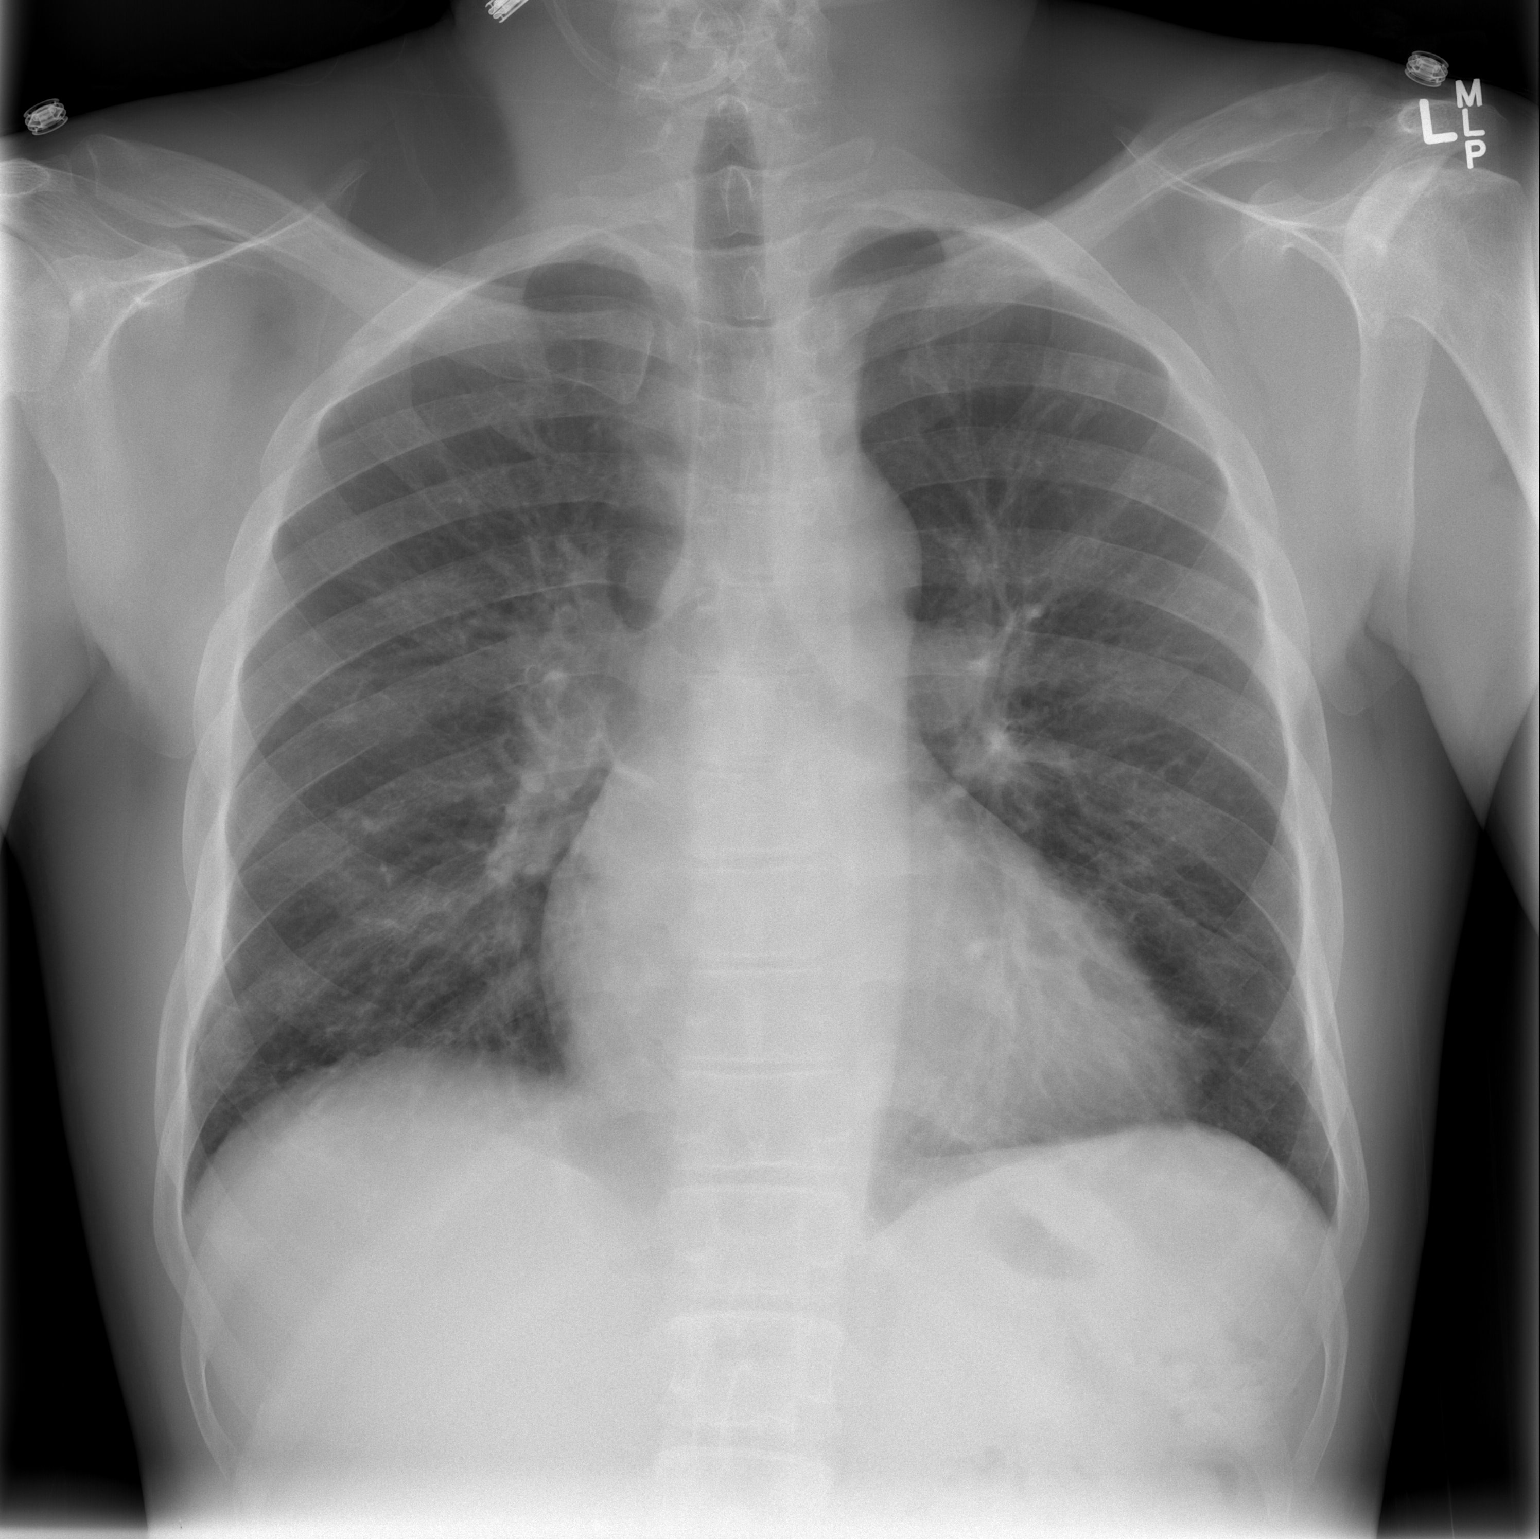

[w chest lat]
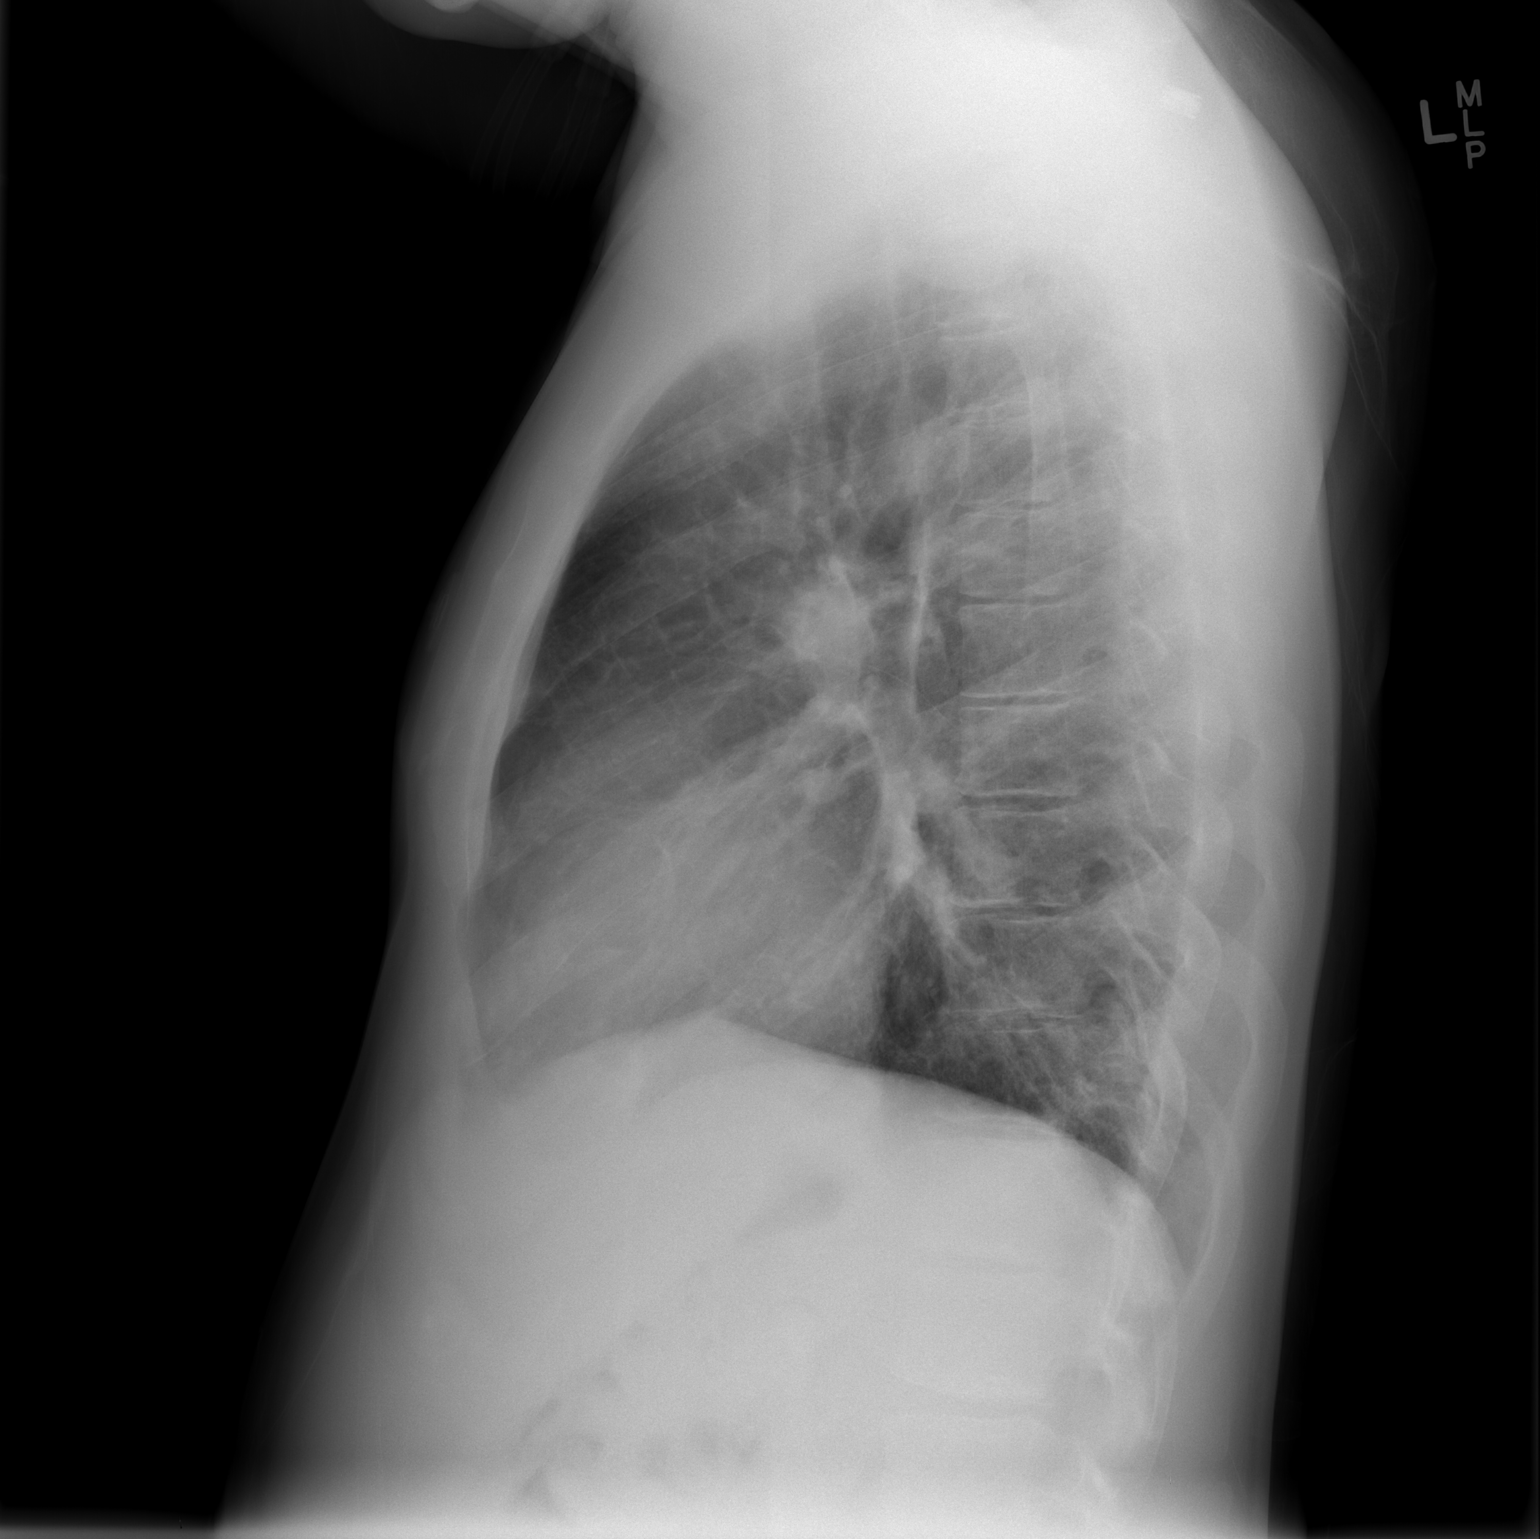

[2 of 2 positions shown; findings below may reference images not displayed]

FINDINGS: Mildly enlarged cardiac silhouette without significant
change.  No significant change in minimal linear density at both
lung bases.  Stable minimal diffuse peribronchial thickening and
accentuation of the interstitial markings.  Minimal scoliosis.
IMPRESSION: 1.  Stable mild cardiomegaly.
2.  Stable minimal chronic bronchitic changes, minimal chronic
interstitial lung disease and minimal bibasilar scarring.
3.  No acute abnormality.

## 2012-05-29 MED ORDER — SODIUM CHLORIDE 0.9 % IV BOLUS (SEPSIS)
1000.0000 mL | Freq: Once | INTRAVENOUS | Status: AC
  Administered 2012-05-29: 1000 mL via INTRAVENOUS

## 2012-05-29 MED ORDER — DEXTROSE 5 % IV SOLN
2000.0000 mg | Freq: Every day | INTRAVENOUS | Status: DC
  Administered 2012-05-29 – 2012-06-04 (×7): 2000 mg via INTRAVENOUS
  Filled 2012-05-29 (×8): qty 2

## 2012-05-29 MED ORDER — DEXTROSE-NACL 5-0.45 % IV SOLN
INTRAVENOUS | Status: DC
  Administered 2012-05-29 – 2012-06-03 (×10): via INTRAVENOUS
  Administered 2012-06-03: 1000 mL via INTRAVENOUS
  Administered 2012-06-04 – 2012-06-05 (×2): via INTRAVENOUS

## 2012-05-29 MED ORDER — HYDROMORPHONE HCL PF 2 MG/ML IJ SOLN
2.0000 mg | Freq: Once | INTRAMUSCULAR | Status: AC
  Administered 2012-05-29: 2 mg via INTRAVENOUS
  Filled 2012-05-29: qty 1

## 2012-05-29 MED ORDER — ONDANSETRON HCL 4 MG PO TABS: 4.0000 mg | ORAL_TABLET | ORAL | Status: DC | PRN

## 2012-05-29 MED ORDER — POTASSIUM CHLORIDE CRYS ER 20 MEQ PO TBCR
20.0000 meq | EXTENDED_RELEASE_TABLET | Freq: Two times a day (BID) | ORAL | Status: DC
  Administered 2012-05-29 – 2012-06-05 (×14): 20 meq via ORAL
  Filled 2012-05-29 (×15): qty 1

## 2012-05-29 MED ORDER — ENOXAPARIN SODIUM 80 MG/0.8ML ~~LOC~~ SOLN
1.0000 mg/kg | Freq: Two times a day (BID) | SUBCUTANEOUS | Status: DC
  Administered 2012-05-29 – 2012-05-31 (×6): 80 mg via SUBCUTANEOUS
  Filled 2012-05-29 (×10): qty 0.8

## 2012-05-29 MED ORDER — DIPHENHYDRAMINE HCL 50 MG/ML IJ SOLN
25.0000 mg | Freq: Once | INTRAMUSCULAR | Status: AC
  Administered 2012-05-29: 25 mg via INTRAVENOUS
  Filled 2012-05-29: qty 1

## 2012-05-29 MED ORDER — HYDROMORPHONE HCL PF 2 MG/ML IJ SOLN
2.0000 mg | INTRAMUSCULAR | Status: DC | PRN
  Administered 2012-05-29 – 2012-05-31 (×19): 4 mg via INTRAVENOUS
  Filled 2012-05-29 (×22): qty 2

## 2012-05-29 MED ORDER — POTASSIUM CHLORIDE CRYS ER 20 MEQ PO TBCR
40.0000 meq | EXTENDED_RELEASE_TABLET | Freq: Once | ORAL | Status: AC
  Administered 2012-05-29: 40 meq via ORAL
  Filled 2012-05-29: qty 2

## 2012-05-29 MED ORDER — SENNOSIDES-DOCUSATE SODIUM 8.6-50 MG PO TABS
1.0000 | ORAL_TABLET | Freq: Two times a day (BID) | ORAL | Status: DC
  Administered 2012-05-30 – 2012-06-05 (×13): 1 via ORAL
  Filled 2012-05-29 (×16): qty 1

## 2012-05-29 MED ORDER — HYDROMORPHONE HCL PF 2 MG/ML IJ SOLN
4.0000 mg | INTRAMUSCULAR | Status: DC
  Administered 2012-05-29 (×4): 4 mg via INTRAVENOUS
  Filled 2012-05-29: qty 2

## 2012-05-29 MED ORDER — ONDANSETRON HCL 4 MG/2ML IJ SOLN
4.0000 mg | Freq: Once | INTRAMUSCULAR | Status: AC
  Administered 2012-05-29: 4 mg via INTRAVENOUS
  Filled 2012-05-29: qty 2

## 2012-05-29 MED ORDER — HYDROXYUREA 500 MG PO CAPS: 500.0000 mg | ORAL_CAPSULE | Freq: Two times a day (BID) | ORAL | Status: DC

## 2012-05-29 MED ORDER — KETOROLAC TROMETHAMINE 30 MG/ML IJ SOLN
30.0000 mg | Freq: Four times a day (QID) | INTRAMUSCULAR | Status: AC
  Administered 2012-05-29 – 2012-06-03 (×19): 30 mg via INTRAVENOUS
  Filled 2012-05-29 (×19): qty 1

## 2012-05-29 MED ORDER — MORPHINE SULFATE ER 15 MG PO TBCR
15.0000 mg | EXTENDED_RELEASE_TABLET | Freq: Two times a day (BID) | ORAL | Status: DC
  Administered 2012-05-29 – 2012-06-05 (×15): 15 mg via ORAL
  Filled 2012-05-29 (×15): qty 1

## 2012-05-29 MED ORDER — HYDROXYUREA 500 MG PO CAPS: 500.0000 mg | ORAL_CAPSULE | Freq: Every day | ORAL | Status: DC

## 2012-05-29 MED ORDER — OLANZAPINE 10 MG PO TABS
10.0000 mg | ORAL_TABLET | Freq: Every day | ORAL | Status: DC
  Administered 2012-05-29 – 2012-06-04 (×7): 10 mg via ORAL
  Filled 2012-05-29 (×9): qty 1

## 2012-05-29 MED ORDER — FOLIC ACID 1 MG PO TABS
1.0000 mg | ORAL_TABLET | Freq: Every day | ORAL | Status: DC
  Administered 2012-05-29 – 2012-06-05 (×8): 1 mg via ORAL
  Filled 2012-05-29 (×9): qty 1

## 2012-05-29 MED ORDER — ZOLPIDEM TARTRATE 5 MG PO TABS
5.0000 mg | ORAL_TABLET | Freq: Every evening | ORAL | Status: DC | PRN
  Administered 2012-05-30 – 2012-06-03 (×5): 5 mg via ORAL
  Filled 2012-05-29 (×5): qty 1

## 2012-05-29 MED ORDER — DIPHENHYDRAMINE HCL 25 MG PO CAPS
25.0000 mg | ORAL_CAPSULE | ORAL | Status: DC | PRN
  Administered 2012-06-04: 50 mg via ORAL
  Administered 2012-06-05 (×2): 25 mg via ORAL
  Filled 2012-05-29 (×2): qty 1
  Filled 2012-05-29: qty 2

## 2012-05-29 MED ORDER — WARFARIN SODIUM 10 MG PO TABS
10.0000 mg | ORAL_TABLET | Freq: Once | ORAL | Status: AC
  Administered 2012-05-29: 10 mg via ORAL
  Filled 2012-05-29: qty 1

## 2012-05-29 MED ORDER — DIPHENHYDRAMINE HCL 50 MG/ML IJ SOLN
12.5000 mg | INTRAMUSCULAR | Status: DC | PRN
  Administered 2012-06-03 – 2012-06-04 (×7): 25 mg via INTRAVENOUS
  Filled 2012-05-29 (×7): qty 1

## 2012-05-29 MED ORDER — HYDROXYUREA 500 MG PO CAPS
500.0000 mg | ORAL_CAPSULE | Freq: Two times a day (BID) | ORAL | Status: DC
  Administered 2012-05-29 – 2012-06-05 (×15): 500 mg via ORAL
  Filled 2012-05-29 (×16): qty 1

## 2012-05-29 MED ORDER — ONDANSETRON HCL 4 MG/2ML IJ SOLN: 4.0000 mg | INTRAMUSCULAR | Status: DC | PRN

## 2012-05-29 MED ORDER — LISINOPRIL 10 MG PO TABS
10.0000 mg | ORAL_TABLET | Freq: Every day | ORAL | Status: DC
  Administered 2012-05-29 – 2012-06-04 (×7): 10 mg via ORAL
  Filled 2012-05-29 (×10): qty 1

## 2012-05-29 MED ORDER — WARFARIN - PHARMACIST DOSING INPATIENT: Freq: Every day | Status: DC

## 2012-05-29 NOTE — ED Notes (Signed)
Patient is alert and oriented x3.  He is complaining of lower back pain and bilateral leg pain associated with his sickle cell disease.  He currently rates his pain 10 of 10.  He states that his last crisis was over 2 weeks ago.

## 2012-05-29 NOTE — Progress Notes (Signed)
Spoke with NP Glade Nurse concerning admitting [facility]., will notify NP edwards and return call back to ED

## 2012-05-29 NOTE — ED Provider Notes (Signed)
History/physical exam/procedure(s) were performed by non-physician practitioner and as supervising physician I was immediately available for consultation/collaboration. I have reviewed all notes and am in agreement with care and plan.   Hilario Quarry, MD 05/29/12 [phone]

## 2012-05-29 NOTE — Progress Notes (Signed)
Received report from venita RN,  [facility]. No other complications. Port access., patient will be admitted to the [facility].

## 2012-05-29 NOTE — ED Notes (Signed)
Pt c/o sickle cell pain x 3-4 hrs; pt states that he is having rib, bilateral leg and lower back pain; pt states that this is typical pain for his sickle cell crisis.

## 2012-05-29 NOTE — H&P (Signed)
[facility] History and Physical   Date: 05/29/2012  Patient name: [name] Medical record number: [id number] Date of birth: 1980/09/17 Age: 32 y.o. Gender: male PCP: [name], [name], MD  Attending physician: No att. providers found  Chief Complaint:  History of Present Illness: [name] present in the Ed this morning and was treated for [facility] and it was felt that he would benefit from more treatment in the [facility] and was transferred for 23 hors observation. He is a 32 year old African American Male with SS genotype SCD. He has frequent pain crisis and seen in the ED or the [facility]. PMH of chronic anticoagulation therapy frequently subtheraputic,, hypokalemia, AVN shoulder, hypertension and mood disorder. He states he woke up in pain this AM around 1 and ran out of medication , also been out of Zyprexa resumed both. He has bilateral abdominal pain no provative or palliative factors associated with pain. The pain is consistent aching sharpe and stabbing in nature. (9/10)   Meds: Prescriptions prior to admission  Medication Sig Dispense Refill  . diphenhydrAMINE (BENADRYL) 25 mg capsule Take 1-2 capsules (25-50 mg total) by mouth every 4 (four) hours as needed for itching.  30 capsule  0  . folic acid (FOLVITE) 1 MG tablet Take 1 mg by mouth every morning.      Marland Kitchen HYDROmorphone (DILAUDID) 4 MG tablet Take 1-2 tablets (4-8 mg total) by mouth every 4 (four) hours as needed for pain. For pain.  60 tablet  0  . hydroxyurea (HYDREA) 500 MG capsule Take 1 capsule (500 mg total) by mouth 2 (two) times daily. May take with food to minimize GI side effects.  60 capsule  2  . ibuprofen (ADVIL,MOTRIN) 800 MG tablet Take 1 tablet (800 mg total) by mouth every 8 (eight) hours as needed for pain. Do not exceed use for more than 20/30 days  30 tablet  0  . lisinopril (PRINIVIL,ZESTRIL) 10 MG tablet Take 1 tablet (10 mg total) by mouth daily at 10 pm.  30 tablet  2  . potassium chloride SA  (K-DUR,KLOR-CON) 20 MEQ tablet Take 20 mEq by mouth daily.      Marland Kitchen warfarin (COUMADIN) 5 MG tablet Take 7.5 mg by mouth every evening. Take 7.5 every day until 05/10/12 next appointment      . zolpidem (AMBIEN) 10 MG tablet Take 1 tablet (10 mg total) by mouth at bedtime as needed. For sleep  30 tablet  0    Allergies: Morphine and related Past Medical History  Diagnosis Date  . Sickle cell anemia   . Blood transfusion   . 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 Hypokalemia   . Mood disorder   . Pulmonary embolism   . Avascular necrosis   . Leukocytosis     Chronic  . Thrombocytosis     Chronic  . Hypertension    Past Surgical History  Procedure Laterality Date  . Right hip replacement      08/2006  . Cholecystectomy      01/2008  . Porta cath placement    . Porta cath removal    . Umbilical hernia repair      01/2008  . Excision of left periauricular cyst      10/2009  . Excision of right ear lobe cyst with primary closur      11/2007  . Portacath placement  01/05/2012    Procedure: INSERTION PORT-A-CATH;  Surgeon: Tawanna Cooler  Laurie Panda, MD;  Location: [facility] OR;  Service: General;  Laterality: N/A;  ultrasound guiced port a cath insertion with fluoroscopy   Family History  Problem Relation Age of Onset  . Sickle cell anemia Mother   . Sickle cell anemia Father   . Sickle cell trait Brother    History   Social History  . Marital Status: Single    Spouse Name: N/A    Number of Children: 0  . Years of Education: 13   Occupational History  . Unemployed    Social History Main Topics  . Smoking status: Former Smoker -- 13 years    Types: Cigarettes    Quit date: 07/08/2010  . Smokeless tobacco: Never Used  . Alcohol Use: No  . Drug Use: No     Comment: quit smoking 2011  . Sexually Active: Yes    Birth Control/ Protection: None   Other Topics Concern  . Not on file   Social History Narrative   Lives in an apartment.  Single.  Lives with  girlfriend.  Does not use any assist devices.        [name]:  [phone] Mom, emergency contact    Review of Systems: A comprehensive review of systems was negative except for: Musculoskeletal: positive for bilateral rib and arthralgias pain.   Physical Exam: Blood pressure 126/68, pulse 63, temperature 98 F (36.7 C), temperature source Oral, resp. rate 19, height 6' (1.829 m), weight 78.019 kg (172 lb), SpO2 96.00%. BP 123/74  Pulse 77  Temp(Src) 98.7 F (37.1 C) (Oral)  Resp 23  Ht 6' (1.829 m)  Wt 78.019 kg (172 lb)  BMI 23.32 kg/m2  SpO2 96%  General Appearance:    Alert, cooperative, no distress, appears stated age  Head:    Normocephalic, without obvious abnormality, atraumatic  Eyes:    PERRL, conjunctiva/corneas clear, EOM's intact, fundi    benign, both eyes       Ears:    Normal TM's and external ear canals, both ears  Nose:   Nares normal, septum midline, mucosa normal, no drainage    or sinus tenderness  Throat:   Lips, mucosa, and tongue normal; teeth and gums normal  Neck:   Supple, symmetrical, trachea midline, no adenopathy;       thyroid:  No enlargement/tenderness/nodules; no carotid   bruit or JVD  Back:     Symmetric, no curvature, ROM normal, no CVA tenderness  Lungs:     Clear to auscultation bilaterally, respirations unlabored  Chest wall:    No tenderness or deformity, porta cath RUQ  Heart:    Regular rate and rhythm, S1 and S2 normal, no murmur, rub   or gallop  Abdomen:     Soft, non-tender, bowel sounds active all four quadrants,    no masses, no organomegaly  Genitalia:   Denies priapism    :    Extremities:   Extremities normal, atraumatic, no cyanosis or edema  Pulses:   2+ and symmetric all extremities  Skin:   Skin color, texture, turgor normal, no rashes or lesions, numerus tattoos.   Lymph nodes:   Cervical, supraclavicular, and axillary nodes normal  Neurologic:   CNII-XII intact. Normal strength, sensation and reflexes       throughout    Lab results: Results for orders placed during the hospital encounter of 05/29/12 (from the past 24 hour(s))  CBC WITH DIFFERENTIAL     Status: Abnormal   Collection Time  05/29/12  5:30 AM      Result Value Range   WBC 20.0 (*) 4.0 - 10.5 K/uL   RBC 2.40 (*) 4.22 - 5.81 MIL/uL   Hemoglobin 7.6 (*) 13.0 - 17.0 g/dL   HCT 16.1 (*) 09.6 - 04.5 %   MCV 89.6  78.0 - 100.0 fL   MCH 31.7  26.0 - 34.0 pg   MCHC 35.3  30.0 - 36.0 g/dL   RDW 40.9 (*) 81.1 - 91.4 %   Platelets 409 (*) 150 - 400 K/uL   Neutrophils Relative 71  43 - 77 %   Lymphocytes Relative 12  12 - 46 %   Monocytes Relative 12  3 - 12 %   Eosinophils Relative 4  0 - 5 %   Basophils Relative 1  0 - 1 %   Neutro Abs 14.2 (*) 1.7 - 7.7 K/uL   Lymphs Abs 2.4  0.7 - 4.0 K/uL   Monocytes Absolute 2.4 (*) 0.1 - 1.0 K/uL   Eosinophils Absolute 0.8 (*) 0.0 - 0.7 K/uL   Basophils Absolute 0.2 (*) 0.0 - 0.1 K/uL   RBC Morphology POLYCHROMASIA PRESENT     WBC Morphology VACUOLATED NEUTROPHILS    BASIC METABOLIC PANEL     Status: Abnormal   Collection Time    05/29/12  5:30 AM      Result Value Range   Sodium 140  135 - 145 mEq/L   Potassium 2.9 (*) 3.5 - 5.1 mEq/L   Chloride 109  96 - 112 mEq/L   CO2 20  19 - 32 mEq/L   Glucose, Bld 111 (*) 70 - 99 mg/dL   BUN 5 (*) 6 - 23 mg/dL   Creatinine, Ser 7.82  0.50 - 1.35 mg/dL   Calcium 8.8  8.4 - 95.6 mg/dL   GFR calc non Af Amer >90  >90 mL/min   GFR calc Af Amer >90  >90 mL/min  RETICULOCYTES     Status: Abnormal   Collection Time    05/29/12  5:30 AM      Result Value Range   Retic Ct Pct 19.6 (*) 0.4 - 3.1 %   RBC. 2.40 (*) 4.22 - 5.81 MIL/uL   Retic Count, Manual 417.6 (*) 19.0 - 186.0 K/uL  PROTIME-INR     Status: Abnormal   Collection Time    05/29/12  5:30 AM      Result Value Range   Prothrombin Time 15.6 (*) 11.6 - 15.2 seconds   INR 1.27  0.00 - 1.49  HEPATIC FUNCTION PANEL     Status: Abnormal   Collection Time    05/29/12  5:30 AM       Result Value Range   Total Protein 7.2  6.0 - 8.3 g/dL   Albumin 3.8  3.5 - 5.2 g/dL   AST 30  0 - 37 U/L   ALT 10  0 - 53 U/L   Alkaline Phosphatase 74  39 - 117 U/L   Total Bilirubin 6.1 (*) 0.3 - 1.2 mg/dL   Bilirubin, Direct 0.3  0.0 - 0.3 mg/dL   Indirect Bilirubin 5.8 (*) 0.3 - 0.9 mg/dL  URINALYSIS, ROUTINE W REFLEX MICROSCOPIC     Status: Abnormal   Collection Time    05/29/12  8:43 AM      Result Value Range   Color, Urine AMBER (*) YELLOW   APPearance CLEAR  CLEAR   Specific Gravity, Urine 1.013  1.005 - 1.030   pH  6.5  5.0 - 8.0   Glucose, UA NEGATIVE  NEGATIVE mg/dL   Hgb urine dipstick TRACE (*) NEGATIVE   Bilirubin Urine NEGATIVE  NEGATIVE   Ketones, ur NEGATIVE  NEGATIVE mg/dL   Protein, ur NEGATIVE  NEGATIVE mg/dL   Urobilinogen, UA 1.0  0.0 - 1.0 mg/dL   Nitrite NEGATIVE  NEGATIVE   Leukocytes, UA NEGATIVE  NEGATIVE  URINE MICROSCOPIC-ADD ON     Status: Abnormal   Collection Time    05/29/12  8:43 AM      Result Value Range   Squamous Epithelial / LPF RARE  RARE   WBC, UA 0-2  <3 WBC/hpf   RBC / HPF 0-2  <3 RBC/hpf   Bacteria, UA RARE  RARE   Casts HYALINE CASTS (*) NEGATIVE    Imaging results:  Dg Chest 2 View  05/29/2012  *RADIOLOGY REPORT*  Clinical Data: Chest pain.  Shortness of breath.  History of sickle cell and previous pulmonary embolus.  CHEST - 2 VIEW  Comparison: 05/16/2012  Findings: Stable appearance of power port type central venous catheter.  Mild cardiac enlargement with normal pulmonary vascularity.  Peribronchial thickening and central interstitial changes suggesting chronic bronchitis and fibrosis.  No focal airspace consolidation or edema.  No blunting of costophrenic angles.  No pneumothorax.  Mediastinal contours appear intact. Minimal endplate changes in the spine consistent with history of sickle cell.  Surgical clips in the right upper quadrant.  No significant change since previous study.  IMPRESSION: No evidence of active pulmonary  disease.  Mild cardiac enlargement with chronic bronchitic changes and mild interstitial fibrosis.   Original Report Authenticated By: Burman Nieves, M.D.     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 Treatment /plan is hydromorphone 4mg  Q 2hrs scheduled x's 3 than Q 2 hrs hydromorphone 2-4 mg prn.Adjunction added Toradol 30mg  Q 6hrs for inflammatory/reactive process. Resume home long acting Ms contin 15 mg bid. Anemia Secondary to SCD- Hgb on admission stable follow  Leukocytosis: This is secondary to bone marrow turn over and inflammatory process component. Toradol 30mg  Q 6 hrs for this inflammatory process.  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 Monitor.  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  Chronic anticoagulation: PT/INR  Sub therapeutics, pharmacy consulted and ordered bridge lovenox and coumadin 10 mg tonight    [name], [name] 05/29/2012, 1:07 PM

## 2012-05-29 NOTE — ED Provider Notes (Signed)
History     CSN: [id number]  Arrival date & time 05/29/12  0421   None     Chief Complaint  Patient presents with  . Sickle Cell Pain Crisis    (Consider location/radiation/quality/duration/timing/severity/associated sxs/prior treatment) HPI Comments: 32 y.o. Male with PMHx of SS sickle cell crisis, hypokalemia, thrombosis, PE, hypokalemia, and leukocytosis presents complaining of his typical sickle cell pain since 2am. Pain is in bilateral ribs, bilateral upper thighs and lower back pain. Hypokalemia is a typical reason for onset of crisis.   Pt states pain is 9/10.  States he took his oral morphine and dilaudid at home with no relief. Pt admits vomiting one time this morning, no blood no bile. Denies fever, chest pain, shortness of breath, constipation, diarrhea, headaches, dizziness, visual disturbances, dysuria, hematuria.   Previous admissions include almost monthly at 05/10/12, 04/27/12, 04/06/12, 02/02/12. Most recent discharge was only a few days ago (05/25/12)  Pt states he is compliant with his Coumadin for chronic anti-coagulation.     Patient is a 32 y.o. male presenting with sickle cell pain.  Sickle Cell Pain Crisis  Associated symptoms include vomiting. Pertinent negatives include no chest pain, no photophobia, no abdominal pain, no constipation, no diarrhea, no nausea, no dysuria, no hematuria, no headaches, no neck pain, no weakness, no cough and no rash.    Past Medical History  Diagnosis Date  . Sickle cell anemia   . Blood transfusion   . 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 Hypokalemia   . Mood disorder   . Pulmonary embolism   . Avascular necrosis   . Leukocytosis     Chronic  . Thrombocytosis     Chronic  . Hypertension     Past Surgical History  Procedure Laterality Date  . Right hip replacement      08/2006  . Cholecystectomy      01/2008  . Porta cath placement    . Porta cath removal    . Umbilical hernia repair      01/2008  . Excision of left periauricular cyst      10/2009  . Excision of right ear lobe cyst with primary closur      11/2007  . Portacath placement  01/05/2012    Procedure: INSERTION PORT-A-CATH;  Surgeon: Adolph Pollack, MD;  [name] OR;  Service: General;  Laterality: N/A;  ultrasound guiced port a cath insertion with fluoroscopy    Family History  Problem Relation Age of Onset  . Sickle cell anemia Mother   . Sickle cell anemia Father   . Sickle cell trait Brother     History  Substance Use Topics  . Smoking status: Former Smoker -- 13 years    Types: Cigarettes    Quit date: 07/08/2010  . Smokeless tobacco: Never Used  . Alcohol Use: No      Review of Systems  Constitutional: Positive for chills. Negative for fever and diaphoresis.  HENT: Negative for neck pain and neck stiffness.   Eyes: Negative for photophobia and visual disturbance.  Respiratory: Negative for apnea, cough, chest tightness and shortness of breath.   Cardiovascular: Negative for chest pain and palpitations.  Gastrointestinal: Positive for vomiting. Negative for nausea, abdominal pain, diarrhea and constipation.  Genitourinary: Negative for dysuria and hematuria.  Musculoskeletal: Positive for myalgias. Negative for gait problem.       Pain to bilateral ribs, bilateral upper thighs, lower back  Skin: Negative for rash.  Neurological: Negative for dizziness, weakness, light-headedness, numbness and headaches.    Allergies  Morphine and related  Home Medications   Current Outpatient Rx  Name  Route  Sig  Dispense  Refill  . diphenhydrAMINE (BENADRYL) 25 mg capsule   Oral   Take 1-2 capsules (25-50 mg total) by mouth every 4 (four) hours as needed for itching.   30 capsule   0   . folic acid (FOLVITE) 1 MG tablet   Oral   Take 1 mg by mouth every morning.         Marland Kitchen HYDROmorphone (DILAUDID) 4 MG tablet   Oral   Take 1-2 tablets (4-8 mg total) by mouth every 4 (four) hours as  needed for pain. For pain.   60 tablet   0   . hydroxyurea (HYDREA) 500 MG capsule   Oral   Take 1 capsule (500 mg total) by mouth 2 (two) times daily. May take with food to minimize GI side effects.   60 capsule   2   . ibuprofen (ADVIL,MOTRIN) 800 MG tablet   Oral   Take 1 tablet (800 mg total) by mouth every 8 (eight) hours as needed for pain. Do not exceed use for more than 20/30 days   30 tablet   0   . lisinopril (PRINIVIL,ZESTRIL) 10 MG tablet   Oral   Take 1 tablet (10 mg total) by mouth daily at 10 pm.   30 tablet   2   . potassium chloride SA (K-DUR,KLOR-CON) 20 MEQ tablet   Oral   Take 20 mEq by mouth daily.         Marland Kitchen warfarin (COUMADIN) 5 MG tablet   Oral   Take 7.5 mg by mouth every evening. Take 7.5 every day until 05/10/12 next appointment         . zolpidem (AMBIEN) 10 MG tablet   Oral   Take 1 tablet (10 mg total) by mouth at bedtime as needed. For sleep   30 tablet   0     BP 138/84  Pulse 93  Temp(Src) 97.9 F (36.6 C) (Oral)  Resp 18  Ht 6' (1.829 m)  Wt 172 lb (78.019 kg)  BMI 23.32 kg/m2  SpO2 95%  Physical Exam  Nursing note and vitals reviewed. Constitutional: He is oriented to person, place, and time. He appears well-developed and well-nourished. No distress.  HENT:  Head: Normocephalic and atraumatic.  Eyes: Conjunctivae and EOM are normal.  Neck: Normal range of motion. Neck supple.  No meningeal signs  Cardiovascular: Normal rate, regular rhythm, normal heart sounds and intact distal pulses.  Exam reveals no gallop and no friction rub.   No murmur heard. Pulmonary/Chest: Effort normal and breath sounds normal. No respiratory distress. He has no wheezes. He has no rales. He exhibits no tenderness.  Tenderness to percussion of bilateral ribs  Abdominal: Soft. Bowel sounds are normal. He exhibits no distension. There is no tenderness. There is no rebound and no guarding.  Musculoskeletal: Normal range of motion. He exhibits no  edema and no tenderness.  Mild tenderness to palpation of the lumbar paraspinous muscles Normal strength in upper extremities bilaterally including strong and equal grip strength Baseline right-sided lower extremity weakness s/p hip replacement (2008)  Neurological: He is alert and oriented to person, place, and time. No cranial nerve deficit.  Speech is clear and goal oriented, follows commands Sensation normal to light touch  Moves extremities without ataxia, coordination intact  Skin: Skin  is warm and dry. He is not diaphoretic. No erythema.  Psychiatric: He has a normal mood and affect.    ED Course  Procedures (including critical care time)  Labs Reviewed  CBC WITH DIFFERENTIAL - Abnormal; Notable for the following:    WBC 20.0 (*)    RBC 2.40 (*)    Hemoglobin 7.6 (*)    HCT 21.5 (*)    RDW 21.4 (*)    Platelets 409 (*)    Neutro Abs 14.2 (*)    Monocytes Absolute 2.4 (*)    Eosinophils Absolute 0.8 (*)    Basophils Absolute 0.2 (*)    All other components within normal limits  BASIC METABOLIC PANEL - Abnormal; Notable for the following:    Potassium 2.9 (*)    Glucose, Bld 111 (*)    BUN 5 (*)    All other components within normal limits  RETICULOCYTES - Abnormal; Notable for the following:    Retic Ct Pct 19.6 (*)    RBC. 2.40 (*)    Retic Count, Manual 417.6 (*)    All other components within normal limits  PROTIME-INR - Abnormal; Notable for the following:    Prothrombin Time 15.6 (*)    All other components within normal limits  HEPATIC FUNCTION PANEL - Abnormal; Notable for the following:    Total Bilirubin 6.1 (*)    Indirect Bilirubin 5.8 (*)    All other components within normal limits  URINALYSIS, ROUTINE W REFLEX MICROSCOPIC - Abnormal; Notable for the following:    Color, Urine AMBER (*)    Hgb urine dipstick TRACE (*)    All other components within normal limits  URINE MICROSCOPIC-ADD ON - Abnormal; Notable for the following:    Casts HYALINE CASTS  (*)    All other components within normal limits   Dg Chest 2 View  05/29/2012  *RADIOLOGY REPORT*  Clinical Data: Chest pain.  Shortness of breath.  History of sickle cell and previous pulmonary embolus.  CHEST - 2 VIEW  Comparison: 05/16/2012  Findings: Stable appearance of power port type central venous catheter.  Mild cardiac enlargement with normal pulmonary vascularity.  Peribronchial thickening and central interstitial changes suggesting chronic bronchitis and fibrosis.  No focal airspace consolidation or edema.  No blunting of costophrenic angles.  No pneumothorax.  Mediastinal contours appear intact. Minimal endplate changes in the spine consistent with history of sickle cell.  Surgical clips in the right upper quadrant.  No significant change since previous study.  IMPRESSION: No evidence of active pulmonary disease.  Mild cardiac enlargement with chronic bronchitic changes and mild interstitial fibrosis.   Original Report Authenticated By: Burman Nieves, M.D.     Date: 05/29/2012  Rate: 68  Rhythm: sinus rhythm  QRS Axis: normal  Intervals: normal  ST/T Wave abnormalities: normal  Conduction Disutrbances: left fascicular block  Narrative Interpretation: abormal EKG  Old EKG Reviewed: similar findings 05/25/12  Medications  enoxaparin (LOVENOX) injection 80 mg (80 mg Subcutaneous Given 05/29/12 1036)  warfarin (COUMADIN) tablet 10 mg (not administered)  Warfarin - Pharmacist Dosing Inpatient ( Does not apply Duplicate 05/29/12 1800)  sodium chloride 0.9 % bolus 1,000 mL (1,000 mLs Intravenous New Bag/Given 05/29/12 0623)  HYDROmorphone (DILAUDID) injection 2 mg (2 mg Intravenous Given 05/29/12 0652)  diphenhydrAMINE (BENADRYL) injection 25 mg (25 mg Intravenous Given 05/29/12 0652)  ondansetron (ZOFRAN) injection 4 mg (4 mg Intravenous Given 05/29/12 0652)  potassium chloride SA (K-DUR,KLOR-CON) CR tablet 40 mEq (40 mEq Oral Given  05/29/12 2952)  HYDROmorphone (DILAUDID) injection 2 mg  (2 mg Intravenous Given 05/29/12 0735)  sodium chloride 0.9 % bolus 1,000 mL (1,000 mLs Intravenous New Bag/Given 05/29/12 0735)  HYDROmorphone (DILAUDID) injection 2 mg (2 mg Intravenous Given 05/29/12 0917)  diphenhydrAMINE (BENADRYL) injection 25 mg (25 mg Intravenous Given 05/29/12 0917)  HYDROmorphone (DILAUDID) injection 2 mg (2 mg Intravenous Given 05/29/12 1018)     1. Sickle cell pain crisis   2. Hypokalemia   3. Supratherapeutic INR       MDM  PMHx of sickle cell crisis, thrombosis, PE, hypokalemia, and leukocytosis presents complaining of his typical sickle cell pain since 2am. Pain is in bilateral ribs, bilateral upper thights and lower back pain. Review of previous medical notes show frequent visits, discharged when pain was under control. Admissions include 05/10/12, 04/27/12, 04/06/12, 02/02/12  Labs reveal hypokalemia (typcially a cause of this pt's crisis), elevated bili (6.1), retic (417.6) and subtherapeutic INR of 1.27 though pt states he has been compliant with his warfarin. Pharmacy consult appreciated to start Lovenox bridge.   Pt entered ED with 9/10 pain. On re-evaluation after 8mg  of Dilaudid, 50mg  of Benadryl, 2 liters of fluid, and potassium pill, unable to control pt pain, lowered only to 8/10. [facility] of his pain.   Spoke with [name], NP who will accept patient transfer. EMTALA completed.   Glade Nurse, PA-C 05/29/12 1127

## 2012-05-29 NOTE — Progress Notes (Signed)
ANTICOAGULATION CONSULT NOTE - Initial Consult  Pharmacy Consult for Warfarin/Lovenox Indication: Hx PE, subtherapeutic INR  Allergies  Allergen Reactions  . Morphine And Related Hives and Rash    Pt states he is able to tolerate Dilaudid with no reactions.    Patient Measurements: Height: 6' (182.9 cm) Weight: 172 lb (78.019 kg) IBW/kg (Calculated) : 77.6  Vital Signs: Temp: 98.6 F (37 C) (04/29 0744) Temp src: Oral (04/29 0744) BP: 109/67 mmHg (04/29 0744) Pulse Rate: 63 (04/29 0744)  Labs:  Recent Labs  05/29/12 0530  HGB 7.6*  HCT 21.5*  PLT 409*  LABPROT 15.6*  INR 1.27  CREATININE 0.56    Estimated Creatinine Clearance: 144.2 ml/min (by C-G formula based on Cr of 0.56).   Medical History: Past Medical History  Diagnosis Date  . Sickle cell anemia   . Blood transfusion   . 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 Hypokalemia   . Mood disorder   . Pulmonary embolism   . Avascular necrosis   . Leukocytosis     Chronic  . Thrombocytosis     Chronic  . 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  INR subtherapeutic @ 1.27 and pharmacy asked to manage lovenox/warfarin bridge. Last dose warfarin: 4/28  Renal function: Stable. CrCl >100  Hgb 7.6, plts 409, no bleeding noted  Wt: 78kg  INR noted to have been subtherapeutic the majority of time since early March 2014.    Goal of Therapy:  INR 2-3 Anti-Xa level 0.6-1.2 units/ml 4hrs after LMWH dose given Monitor platelets by anticoagulation protocol: Yes   Plan:  Lovenox 1mg /kg q 12 hours, warfarin 10mg  (boosted) x 1 tonight.  Daily PT/INR, CBC  Haynes Hoehn, PharmD 05/29/2012 9:17 AM  Pager: [phone]

## 2012-05-30 ENCOUNTER — Encounter (HOSPITAL_COMMUNITY): Payer: Self-pay

## 2012-05-30 LAB — DIFFERENTIAL
Basophils Relative: 1 % (ref 0–1)
Lymphocytes Relative: 26 % (ref 12–46)
Monocytes Relative: 15 % — ABNORMAL HIGH (ref 3–12)
Neutro Abs: 9.9 10*3/uL — ABNORMAL HIGH (ref 1.7–7.7)

## 2012-05-30 LAB — PROTIME-INR: INR: 1.32 (ref 0.00–1.49)

## 2012-05-30 LAB — CBC
HCT: 19.7 % — ABNORMAL LOW (ref 39.0–52.0)
Hemoglobin: 6.7 g/dL — CL (ref 13.0–17.0)
MCHC: 34 g/dL (ref 30.0–36.0)
MCV: 90.8 fL (ref 78.0–100.0)

## 2012-05-30 MED ORDER — POTASSIUM CHLORIDE 10 MEQ/100ML IV SOLN
10.0000 meq | INTRAVENOUS | Status: AC
  Administered 2012-05-30 (×2): 10 meq via INTRAVENOUS
  Filled 2012-05-30 (×2): qty 100

## 2012-05-30 MED ORDER — WARFARIN SODIUM 10 MG PO TABS
10.0000 mg | ORAL_TABLET | Freq: Once | ORAL | Status: AC
  Administered 2012-05-30: 10 mg via ORAL
  Filled 2012-05-30: qty 1

## 2012-05-30 NOTE — Progress Notes (Signed)
Spoke with Dr. Ashley Royalty regarding patients Hgb of 6.7 today, and being 7.7 on 05/10/2012.  Orders for type & cross and transfusion received.  RN will continue to monitor

## 2012-05-30 NOTE — Progress Notes (Signed)
Called a critical HGB of 6.7 to Dr. Marthann Schiller, no order.

## 2012-05-30 NOTE — Progress Notes (Signed)
Brief history :: [name] is a 32 year old African American Male who was initially send in the ED for VOC and pain not controlled and was transferred to the 23 observation unit for further treatment. Laboratory data this AM showed a significant drop in HGB  indicated a significant drop in BP and was type and crossed for 1 unit of RBC and transfused. After receiving transfusion he remained fatigue and pain not controlled despite scheduled and prn hydromorphone. Pharmacy is dosing chronic anticoagulation therapy which was sub therapeutic at admission.    Subjective:  In today seeing patient still rating pain 9/10 and feeling fatigue and not able to mange at home.. During visit he had a nose bleed quickly stopped with pressure no s/s related to. He remains in mild distress clenching bilateral sides.   Objective: Vital signs in last 24 hours: Filed Vitals:   05/30/12 1100 05/30/12 1200 05/30/12 1233 05/30/12 1237  BP: 126/84 119/83 127/85   Pulse: 86 86 86 82  Temp: 99.1 F (37.3 C) 99 F (37.2 C) 99.1 F (37.3 C)   TempSrc: Oral Oral Oral   Resp: 20 21 20 19   Height:      Weight:      SpO2: 94% 94% 92%    Weight change:   Intake/Output Summary (Last 24 hours) at 05/30/12 1503 Last data filed at 05/30/12 0945  Gross per 24 hour  Intake    250 ml  Output      0 ml  Net    250 ml    Physical Exam: BP 149/95  Pulse 99  Temp(Src) 98.3 F (36.8 C) (Oral)  Resp 20  Ht 6' (1.829 m)  Wt 79.379 kg (175 lb)  BMI 23.73 kg/m2  SpO2 93%  General Appearance:    Alert, cooperative, no distress, appears stated age  Head:    Normocephalic, without obvious abnormality, atraumatic  Eyes:    PERRL,mild , icteric sclera  EOM's intact, fundi    benign, both eyes       Ears:    Normal TM's and external ear canals, both ears  Nose:   Nares normal, septum midline, mucosa normal, no drainage    or sinus tenderness  Throat:   Lips, mucosa, and tongue normal; teeth and gums normal  Neck:    Supple, symmetrical, trachea midline, no adenopathy;       thyroid:  No enlargement/tenderness/nodules; no carotid   bruit or JVD  Back:     Symmetric, no curvature, ROM normal, no CVA tenderness  Lungs:     Clear to auscultation bilaterally, respirations unlabored  Chest wall:    No tenderness or deformity, porta cath RUQ   Heart:    Regular rate and rhythm, S1 and S2 normal, no murmur, rub   or gallop  Abdomen:     Soft,mild tenderness, bowel sounds active all four quadrants,    no masses, no organomegaly  Genitalia:   deferred denies priapism      Extremities:   Extremities normal, atraumatic, no cyanosis or edema  Pulses:   2+ and symmetric all extremities  Skin:   Skin color, texture, turgor normal, no rashes or lesions  Lymph nodes:   Cervical, supraclavicular, and axillary nodes normal  Neurologic:   CNII-XII intact. Normal strength, sensation and reflexes      throughout    Lab Results:  Recent Labs  05/29/12 0530  NA 140  K 2.9*  CL 109  CO2 20  GLUCOSE 111*  BUN 5*  CREATININE 0.56  CALCIUM 8.8    Recent Labs  05/29/12 0530  AST 30  ALT 10  ALKPHOS 74  BILITOT 6.1*  PROT 7.2  ALBUMIN 3.8   No results found for this basename: LIPASE, AMYLASE,  in the last 72 hours  Recent Labs  05/29/12 0530 05/30/12 0555  WBC 20.0* 19.0*  NEUTROABS 14.2* 9.9*  HGB 7.6* 6.7*  HCT 21.5* 19.7*  MCV 89.6 90.8  PLT 409* 345   No results found for this basename: CKTOTAL, CKMB, CKMBINDEX, TROPONINI,  in the last 72 hours No components found with this basename: POCBNP,  No results found for this basename: DDIMER,  in the last 72 hours No results found for this basename: HGBA1C,  in the last 72 hours No results found for this basename: CHOL, HDL, LDLCALC, TRIG, CHOLHDL, LDLDIRECT,  in the last 72 hours No results found for this basename: TSH, T4TOTAL, FREET3, T3FREE, THYROIDAB,  in the last 72 hours  Recent Labs  05/29/12 0530  RETICCTPCT 19.6*    Micro  Results: No results found for this or any previous visit (from the past 240 hour(s)).  Studies/Results: Dg Chest 2 View  05/29/2012  *RADIOLOGY REPORT*  Clinical Data: Chest pain.  Shortness of breath.  History of sickle cell and previous pulmonary embolus.  CHEST - 2 VIEW  Comparison: 05/16/2012  Findings: Stable appearance of power port type central venous catheter.  Mild cardiac enlargement with normal pulmonary vascularity.  Peribronchial thickening and central interstitial changes suggesting chronic bronchitis and fibrosis.  No focal airspace consolidation or edema.  No blunting of costophrenic angles.  No pneumothorax.  Mediastinal contours appear intact. Minimal endplate changes in the spine consistent with history of sickle cell.  Surgical clips in the right upper quadrant.  No significant change since previous study.  IMPRESSION: No evidence of active pulmonary disease.  Mild cardiac enlargement with chronic bronchitic changes and mild interstitial fibrosis.   Original Report Authenticated By: Burman Nieves, M.D.     Medications: I have reviewed the patient's current medications. Scheduled Meds: . deferoxamine (DESFERAL) IV  2,000 mg Intravenous QHS  . enoxaparin (LOVENOX) injection  1 mg/kg Subcutaneous Q12H  . folic acid  1 mg Oral Daily  . hydroxyurea  500 mg Oral BID  . ketorolac  30 mg Intravenous Q6H  . lisinopril  10 mg Oral Q2200  . morphine  15 mg Oral Q12H  . OLANZapine  10 mg Oral QHS  . potassium chloride SA  20 mEq Oral BID  . senna-docusate  1 tablet Oral BID  . Warfarin - Pharmacist Dosing Inpatient   Does not apply q1800   Continuous Infusions: . 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 Treatment /plan is hydromorphone 4mg  Q 2hrs scheduled x's 3 than Q 2 hrs hydromorphone 2-4 mg prn.Adjunction added Toradol 30mg  Q 6hrs for inflammatory/reactive process. Resume home long acting Ms contin 15 mg bid. During hospitalization may need to consider PCA pump for improvement in pain management.  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  Leukocytosis: This is secondary to bone marrow turn over and inflammatory process component. Toradol 30mg  Q 6  hrs for this inflammatory process.. 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  Chronic anticoagulation: PT/INR Sub therapeutics, pharmacy dosing  Hypokalemia  Receiving Kdur daily will add an addition dose    LOS: 1 day  [name], [name] 05/30/2012, 3:03 PM

## 2012-05-30 NOTE — Progress Notes (Signed)
ANTICOAGULATION CONSULT NOTE - Follow Up  Pharmacy Consult for Warfarin/Lovenox Indication: Hx PE, subtherapeutic INR  Allergies  Allergen Reactions  . Morphine And Related Hives and Rash    Pt states he is able to tolerate Dilaudid with no reactions.    Patient Measurements: Height: 6' (182.9 cm) Weight: 175 lb (79.379 kg) IBW/kg (Calculated) : 77.6  Vital Signs: Temp: 98.3 F (36.8 C) (04/30 1518) Temp src: Oral (04/30 1518) BP: 149/95 mmHg (04/30 1518) Pulse Rate: 99 (04/30 1518)  Labs:  Recent Labs  05/29/12 0530 05/30/12 0555  HGB 7.6* 6.7*  HCT 21.5* 19.7*  PLT 409* 345  LABPROT 15.6* 16.1*  INR 1.27 1.32  CREATININE 0.56  --     Estimated Creatinine Clearance: 144.2 ml/min (by C-G formula based on Cr of 0.56).   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  INR subtherapeutic @ 1.27 on admission. Pharmacy asked to manage lovenox/warfarin bridge. Patient transferred to [facility] on 4/30.  INR noted to have been subtherapeutic the majority of time since early March 2014.   INR remains subtherapeutic today but starting to trend up  Plt wnl  No bleeding reported in chart notes   Goal of Therapy:  INR 2-3 Anti-Xa level 0.6-1.2 units/ml 4hrs after LMWH dose given Monitor platelets by anticoagulation protocol: Yes   Plan:  1) Repeat warfarin 10mg  PO x1 2) Continue Lovenox bridge  Darrol Angel, PharmD Pager: [phone] 05/30/2012 6:12 PM

## 2012-05-30 NOTE — Progress Notes (Signed)
Patient called out C/O of nose bleed.  Cool cloth and little pressure applied to nose.  Bleeding stopped.  RN will continue to monitor.

## 2012-05-30 NOTE — Progress Notes (Signed)
Patient ID: [name], male   DOB: [birth year], 32 y.o.   MRN: [id number] Report called to Merlyn Albert on [facility].  Patients belongings packed, and patient will be transported to the floor via wheelchair.

## 2012-05-31 LAB — TYPE AND SCREEN
Antibody Screen: NEGATIVE
Unit division: 0

## 2012-05-31 LAB — COMPREHENSIVE METABOLIC PANEL
Albumin: 3.6 g/dL (ref 3.5–5.2)
Alkaline Phosphatase: 75 U/L (ref 39–117)
BUN: 4 mg/dL — ABNORMAL LOW (ref 6–23)
Chloride: 109 mEq/L (ref 96–112)
Glucose, Bld: 108 mg/dL — ABNORMAL HIGH (ref 70–99)
Potassium: 3.6 mEq/L (ref 3.5–5.1)
Total Bilirubin: 4.8 mg/dL — ABNORMAL HIGH (ref 0.3–1.2)

## 2012-05-31 LAB — CBC WITH DIFFERENTIAL/PLATELET
Basophils Absolute: 0.2 10*3/uL — ABNORMAL HIGH (ref 0.0–0.1)
Eosinophils Absolute: 1.5 10*3/uL — ABNORMAL HIGH (ref 0.0–0.7)
Lymphs Abs: 3.6 10*3/uL (ref 0.7–4.0)
MCV: 89 fL (ref 78.0–100.0)
Monocytes Absolute: 2.6 10*3/uL — ABNORMAL HIGH (ref 0.1–1.0)
Monocytes Relative: 14 % — ABNORMAL HIGH (ref 3–12)
Platelets: 376 10*3/uL (ref 150–400)
RDW: 23.8 % — ABNORMAL HIGH (ref 11.5–15.5)
WBC: 18.9 10*3/uL — ABNORMAL HIGH (ref 4.0–10.5)

## 2012-05-31 LAB — PROTIME-INR: Prothrombin Time: 16.8 seconds — ABNORMAL HIGH (ref 11.6–15.2)

## 2012-05-31 MED ORDER — HYDROMORPHONE HCL PF 2 MG/ML IJ SOLN
2.0000 mg | INTRAMUSCULAR | Status: DC | PRN
  Administered 2012-05-31 – 2012-06-03 (×5): 2 mg via INTRAVENOUS
  Filled 2012-05-31 (×5): qty 1

## 2012-05-31 MED ORDER — HYDROMORPHONE 0.3 MG/ML IV SOLN
INTRAVENOUS | Status: DC
  Administered 2012-05-31: 21:00:00 via INTRAVENOUS
  Administered 2012-05-31: 2.7 mg via INTRAVENOUS
  Administered 2012-05-31: 14:00:00 via INTRAVENOUS
  Administered 2012-05-31: 2.7 mg via INTRAVENOUS
  Administered 2012-06-01: 2.58 mg via INTRAVENOUS
  Administered 2012-06-01: 5.1 mg via INTRAVENOUS
  Administered 2012-06-01: 2.1 mg via INTRAVENOUS
  Administered 2012-06-01: 3.6 mg via INTRAVENOUS
  Administered 2012-06-01: 14:00:00 via INTRAVENOUS
  Administered 2012-06-01: 0.6 mg via INTRAVENOUS
  Administered 2012-06-02: 1.67 mg via INTRAVENOUS
  Administered 2012-06-02 (×2): via INTRAVENOUS
  Administered 2012-06-02: 4.8 mg via INTRAVENOUS
  Administered 2012-06-02: 20:00:00 via INTRAVENOUS
  Administered 2012-06-02: 0.3 mg via INTRAVENOUS
  Administered 2012-06-02: 1.8 mg via INTRAVENOUS
  Administered 2012-06-02: 3.9 mg via INTRAVENOUS
  Administered 2012-06-03: 3.59 mg via INTRAVENOUS
  Administered 2012-06-03: 2.1 mg via INTRAVENOUS
  Administered 2012-06-03: 2.4 mg via INTRAVENOUS
  Administered 2012-06-03: 1.2 mg via INTRAVENOUS
  Administered 2012-06-03: 5.7 mg via INTRAVENOUS
  Administered 2012-06-03: 7.5 mg via INTRAVENOUS
  Administered 2012-06-04: 4.5 mg via INTRAVENOUS
  Administered 2012-06-04: 1.2 mg via INTRAVENOUS
  Administered 2012-06-04: 5.7 mg via INTRAVENOUS
  Administered 2012-06-04: 10:00:00 via INTRAVENOUS
  Administered 2012-06-04: 1.5 mg via INTRAVENOUS
  Filled 2012-05-31 (×10): qty 25

## 2012-05-31 MED ORDER — WARFARIN SODIUM 2.5 MG PO TABS
12.5000 mg | ORAL_TABLET | Freq: Once | ORAL | Status: AC
  Administered 2012-05-31: 12.5 mg via ORAL
  Filled 2012-05-31: qty 1

## 2012-05-31 MED ORDER — SODIUM CHLORIDE 0.9 % IJ SOLN: 9.0000 mL | INTRAMUSCULAR | Status: DC | PRN

## 2012-05-31 MED ORDER — NALOXONE HCL 0.4 MG/ML IJ SOLN: 0.4000 mg | INTRAMUSCULAR | Status: DC | PRN

## 2012-05-31 NOTE — Progress Notes (Signed)
ANTICOAGULATION CONSULT NOTE - Follow Up  Pharmacy Consult for Warfarin/Lovenox Indication: Hx PE, subtherapeutic INR  Allergies  Allergen Reactions  . Morphine And Related Hives and Rash    Pt states he is able to tolerate Dilaudid with no reactions.    Patient Measurements: Height: 6' (182.9 cm) Weight: 175 lb (79.379 kg) IBW/kg (Calculated) : 77.6  Vital Signs: Temp: 99.2 F (37.3 C) (05/01 0600) Temp src: Oral (05/01 0600) BP: 131/88 mmHg (05/01 0600) Pulse Rate: 99 (05/01 0600)  Labs:  Recent Labs  05/29/12 0530 05/30/12 0555 05/31/12 0500  HGB 7.6* 6.7*  --   HCT 21.5* 19.7*  --   PLT 409* 345  --   LABPROT 15.6* 16.1* 16.8*  INR 1.27 1.32 1.40  CREATININE 0.56  --   --     Estimated Creatinine Clearance: 144.2 ml/min (by C-G formula based on Cr of 0.56).   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  INR subtherapeutic @ 1.27 on admission. Pharmacy asked to manage lovenox/warfarin bridge. Patient transferred to [facility] on 4/30.  INR noted to have been subtherapeutic the majority of time since early March 2014.   INR remains subtherapeutic today but starting to trend up  Plt wnl  No bleeding reported in chart notes   Goal of Therapy:  INR 2-3 Anti-Xa level 0.6-1.2 units/ml 4hrs after LMWH dose given Monitor platelets by anticoagulation protocol: Yes   Plan:  1) Warfarin 12.5 mg PO x1 2) Continue Lovenox bridge 3.) Daily PT/INR  Lynise Porr, Loma Messing PharmD Pager #: [phone] 8:12 AM 05/31/2012

## 2012-05-31 NOTE — Progress Notes (Signed)
Patient ambulated around the unit 2 times.  Philomena Doheny RN

## 2012-05-31 NOTE — Progress Notes (Signed)
SICKLE CELL SERVICE PROGRESS NOTE  RYLEY BACHTEL AVW:[id number] DOB: [birth year] DOA: 05/29/2012 PCP: Willey Blade, MD  Assessment/Plan: Active Problems: 1. Hb SS with Crisis: Pt was initially admitted to the [facility] and treated aggressively with IV analgesics and fluids. However, patient continued to have pain that limited function and was thus admitted to the hospital for further management of Acute VOC. Pt is still having significant pain with pain relief lasting only 45 minutes. I will place him on a PCA and give small doses of dilaudid for breakthrough.  Continue MS Contin,Toradol and IVF. Revaluate tomorrow. 2. H/O PE: Pt on chronic anticoagulation. INR sub-therapeutic and Coumadin being titrated by Pharmacy. Pt is frequently sub-therapeutic so will ask CM to investigate Lovenox as an alternative. 3. Leukocytosis: Pt has a chronic leukocytosis with baseline WBC at 15-18K.  4. Mood Disorder. Pt on Zyprexa and mood presently stable. 5. SCD: Pt is on Hydrea, however compliance is unclear. His MCV and WBC are not usual with use of Hydrea.     6. Anemia: Pt is at his baseline of 7.5.  Code Status: Full Code Family Communication: N/A Disposition Plan: Home hopefully in 48-72 hours  [name],[name]  Pager [phone]. If 7PM-7AM, please contact night-coverage.  05/31/2012, 12:22 PM  LOS: 2 days   Brief narrative: [name] present in the Ed this morning and was treated for [facility] and it was felt that he would benefit from more treatment in the [facility] and was transferred for 23 hors observation. He is a 32 year old African American Male with SS genotype SCD. He has frequent pain crisis and seen in the ED or the [facility]. PMH of chronic anticoagulation therapy frequently subtheraputic,, hypokalemia, AVN shoulder, hypertension and mood disorder. He states he woke up in pain this AM around 1 and ran out of medication , also been out of Zyprexa resumed both. He has bilateral abdominal pain no provative or  palliative factors associated with pain. The pain is consistent aching sharpe and stabbing in nature. (9/10)    Consultants:  None  Procedures:  None  Antibiotics:  None  HPI/Subjective: Pt states that pain is improved but still present in back and sides and pain relief lasting only 45 minutes.  He has no other associated symptoms and had a BM yesterday.  Objective: Filed Vitals:   05/30/12 1745 05/30/12 2240 05/31/12 0600 05/31/12 0948  BP: 137/95 130/94 131/88 128/86  Pulse: 94 99 99 93  Temp: 99.2 F (37.3 C) 98.7 F (37.1 C) 99.2 F (37.3 C) 98.5 F (36.9 C)  TempSrc: Oral Oral Oral   Resp: 16 20 20 18   Height:      Weight:      SpO2: 94% 93% 92% 90%   Weight change:   Intake/Output Summary (Last 24 hours) at 05/31/12 1222 Last data filed at 05/31/12 0900  Gross per 24 hour  Intake    610 ml  Output   2075 ml  Net  -1465 ml    General: Alert, awake, oriented x3, in no acute distress.  HEENT: [place]/AT PEERL, EOMI, anicteric Neck: Trachea midline,  no masses, no thyromegal,y no JVD, no carotid bruit OROPHARYNX:  Moist, No exudate/ erythema/lesions.  Heart: Regular rate and rhythm, without murmurs, rubs, gallops, PMI non-displaced, no heaves or thrills on palpation.  Lungs: Clear to auscultation, no wheezing or rhonchi noted. No increased vocal fremitus resonant to percussion  Abdomen: Soft, nontender, nondistended, positive bowel sounds, no masses no hepatosplenomegaly noted.  Neuro: No  focal neurological deficits noted cranial nerves II through XII grossly intact. Strength functional in bilateral upper and lower extremities. Musculoskeletal: No warm swelling or erythema around joints, no spinal tenderness noted. Psychiatric: Patient alert and oriented x3, good insight and cognition, good recent to remote recall.    Data Reviewed: Basic Metabolic Panel:  Recent Labs Lab 05/25/12 2352 05/29/12 0530 05/31/12 1028  NA 139 140 140  K 3.0* 2.9* 3.6  CL 106  109 109  CO2 22 20 23   GLUCOSE 101* 111* 108*  BUN 5* 5* 4*  CREATININE 0.72 0.56 0.47*  CALCIUM 8.6 8.8 8.6   Liver Function Tests:  Recent Labs Lab 05/25/12 2352 05/29/12 0530 05/31/12 1028  AST 23 30 34  ALT 11 10 13   ALKPHOS 76 74 75  BILITOT 4.7* 6.1* 4.8*  PROT 7.1 7.2 6.9  ALBUMIN 3.8 3.8 3.6   No results found for this basename: LIPASE, AMYLASE,  in the last 168 hours No results found for this basename: AMMONIA,  in the last 168 hours CBC:  Recent Labs Lab 05/25/12 2352 05/29/12 0530 05/30/12 0555 05/31/12 1028  WBC 18.4* 20.0* 19.0* 18.9*  NEUTROABS 12.1* 14.2* 9.9* 11.0*  HGB 7.6* 7.6* 6.7* 7.4*  HCT 21.9* 21.5* 19.7* 21.8*  MCV 92.4 89.6 90.8 89.0  PLT 453* 409* 345 376   Cardiac Enzymes: No results found for this basename: CKTOTAL, CKMB, CKMBINDEX, TROPONINI,  in the last 168 hours BNP (last 3 results)  Recent Labs  06/07/11 0650 07/05/11 0620 07/18/11 1500  PROBNP 7.4 219.1* 13.5   CBG: No results found for this basename: GLUCAP,  in the last 168 hours  Recent Results (from the past 240 hour(s))  MRSA PCR SCREENING     Status: None   Collection Time    05/30/12  3:50 PM      Result Value Range Status   MRSA by PCR NEGATIVE  NEGATIVE Final   Comment:            The GeneXpert MRSA Assay (FDA     approved for NASAL specimens     only), is one component of a     comprehensive MRSA colonization     surveillance program. It is not     intended to diagnose MRSA     infection nor to guide or     monitor treatment for     MRSA infections.     Studies: Dg Chest 2 View  05/29/2012  *RADIOLOGY REPORT*  Clinical Data: Chest pain.  Shortness of breath.  History of sickle cell and previous pulmonary embolus.  CHEST - 2 VIEW  Comparison: 05/16/2012  Findings: Stable appearance of power port type central venous catheter.  Mild cardiac enlargement with normal pulmonary vascularity.  Peribronchial thickening and central interstitial changes suggesting  chronic bronchitis and fibrosis.  No focal airspace consolidation or edema.  No blunting of costophrenic angles.  No pneumothorax.  Mediastinal contours appear intact. Minimal endplate changes in the spine consistent with history of sickle cell.  Surgical clips in the right upper quadrant.  No significant change since previous study.  IMPRESSION: No evidence of active pulmonary disease.  Mild cardiac enlargement with chronic bronchitic changes and mild interstitial fibrosis.   Original Report Authenticated By: Burman Nieves, M.D.    Dg Chest 2 View  05/16/2012  *RADIOLOGY REPORT*  Clinical Data: Sickle cell disease, chest pain with inspiration  CHEST - 2 VIEW  Comparison: 05/08/2012  Findings: Left subclavian Port-A-Cath stable with tip  projecting over SVC. Enlargement of cardiac silhouette with pulmonary vascular congestion. Mediastinal contours stable. Minimal chronic basilar interstitial prominence, stable. No acute infiltrate, pleural effusion or pneumothorax. Mild central peribronchial thickening.  IMPRESSION: Enlargement of cardiac silhouette with pulmonary vascular congestion. Chronic bronchitic and minimal bibasilar changes. No acute abnormalities.   Original Report Authenticated By: Ulyses Southward, M.D.    Dg Chest 2 View  05/08/2012  *RADIOLOGY REPORT*  Clinical Data: Sickle cell pain crisis.  CHEST - 2 VIEW  Comparison: Chest radiograph performed 04/06/2012, and CTA of the chest performed 03/01/2012  Findings: The lungs are well-aerated.  Mild chronic lung changes are again seen, with mildly increased interstitial markings.  No definite pleural effusion or pneumothorax is identified.  A left-sided chest port is noted ending about the distal SVC.  The heart is borderline normal in size; the mediastinal contour is within normal limits.  No acute osseous abnormalities are seen. Clips are noted within the right upper quadrant, reflecting prior cholecystectomy.  IMPRESSION: Mild chronic lung changes again  seen; lungs otherwise clear.   Original Report Authenticated By: Tonia Ghent, M.D.     Scheduled Meds: . deferoxamine (DESFERAL) IV  2,000 mg Intravenous QHS  . enoxaparin (LOVENOX) injection  1 mg/kg Subcutaneous Q12H  . folic acid  1 mg Oral Daily  . hydroxyurea  500 mg Oral BID  . ketorolac  30 mg Intravenous Q6H  . lisinopril  10 mg Oral Q2200  . morphine  15 mg Oral Q12H  . OLANZapine  10 mg Oral QHS  . potassium chloride SA  20 mEq Oral BID  . senna-docusate  1 tablet Oral BID  . warfarin  12.5 mg Oral ONCE-1800  . Warfarin - Pharmacist Dosing Inpatient   Does not apply q1800   Continuous Infusions: . dextrose 5 % and 0.45% NaCl 125 mL/hr at 05/31/12 1142    Time spent 27 minutes

## 2012-05-31 NOTE — Care Management (Signed)
CARE MANAGEMENT NOTE 05/31/2012  Patient:  [name], [name]   Account Number:  [id number]  Date Initiated:  05/31/2012  Documentation initiated by:  Itzell Bendavid  Subjective/Objective Assessment:   32 yo male admitted with sickle cell crisis. PCP Dr.Dean.     Action/Plan:   Home when stable   Anticipated DC Date:     Anticipated DC Plan:  HOME/SELF CARE         Choice offered to / List presented to:  NA   DME arranged  NA      DME agency  NA     HH arranged  NA      HH agency  NA   Status of service:  In process, will continue to follow Medicare Important Message given?   (If response is "NO", the following Medicare IM given date fields will be blank) Date Medicare IM given:   Date Additional Medicare IM given:    Discharge Disposition:    Per UR Regulation:  Reviewed for med. necessity/level of care/duration of stay  If discussed at Long Length of Stay Meetings, dates discussed:    Comments:  05/31/12 1428 Emilygrace Grothe,RN,BSN [phone] No needs assessed at this time. PTA pt independent.

## 2012-06-01 DIAGNOSIS — F39 Unspecified mood [affective] disorder: Secondary | ICD-10-CM

## 2012-06-01 DIAGNOSIS — I10 Essential (primary) hypertension: Secondary | ICD-10-CM

## 2012-06-01 DIAGNOSIS — D5700 Hb-SS disease with crisis, unspecified: Principal | ICD-10-CM

## 2012-06-01 DIAGNOSIS — D72829 Elevated white blood cell count, unspecified: Secondary | ICD-10-CM

## 2012-06-01 LAB — COMPREHENSIVE METABOLIC PANEL
ALT: 16 U/L (ref 0–53)
AST: 31 U/L (ref 0–37)
Calcium: 8.4 mg/dL (ref 8.4–10.5)
Creatinine, Ser: 0.46 mg/dL — ABNORMAL LOW (ref 0.50–1.35)
GFR calc Af Amer: >90 mL/min (ref >90)
Sodium: 139 mEq/L (ref 135–145)
Total Protein: 6.5 g/dL (ref 6.0–8.3)

## 2012-06-01 LAB — CBC WITH DIFFERENTIAL/PLATELET
Basophils Absolute: 0.2 10*3/uL — ABNORMAL HIGH (ref 0.0–0.1)
Eosinophils Absolute: 1.6 10*3/uL — ABNORMAL HIGH (ref 0.0–0.7)
Eosinophils Relative: 10 % — ABNORMAL HIGH (ref 0–5)
MCH: 30 pg (ref 26.0–34.0)
MCHC: 33.5 g/dL (ref 30.0–36.0)
MCV: 89.5 fL (ref 78.0–100.0)
Platelets: 392 10*3/uL (ref 150–400)
RDW: 23.4 % — ABNORMAL HIGH (ref 11.5–15.5)

## 2012-06-01 MED ORDER — WARFARIN SODIUM 5 MG PO TABS
5.0000 mg | ORAL_TABLET | Freq: Once | ORAL | Status: AC
  Administered 2012-06-01: 5 mg via ORAL
  Filled 2012-06-01: qty 1

## 2012-06-01 NOTE — Progress Notes (Signed)
SICKLE CELL SERVICE PROGRESS NOTE  [name]:[id number] DOB: [birth year] DOA: 05/29/2012 PCP: Willey Blade, MD  Assessment/Plan:  1. Hb SS with Crisis: Patient continues to complain  of pain in bilateral lower extremity. Pain today is 8/10. Continue MS Contin,Toradol and IV Dilaudid when necessary for breakthrough pain. 2. H/O PE: Patient is therapeutic on Coumadin today. Lovenox is discontinued. Will continue to monitor. 3. Leukocytosis: Pt has a chronic leukocytosis, white blood cell count today is 15.3 improved from yesterday. Will monitor.  4. Mood Disorder. Pt is on Zyprexa that he takes at home. Continue Zyprexa. 5. Sickle cell anemia: Pt is on Hydrea.     6.Anemia: Patient's hemoglobin is 7.1 today. Baseline is approximately 7.5. No indication for transfusion at this time. Will monitor Code Status: Full Code Family Communication: N/A Disposition Plan: Home hopefully in 48-72 hours  [name]  Pager [phone]. If 7PM-7AM, please contact night-coverage.  06/01/2012, 12:23 PM  LOS: 3 days   Brief narrative: [name] present in the Ed and was treated for [facility] and it was felt that he would benefit from more treatment in the [facility] and was transferred for 23 hors observation. He is a 32 year old African American Male with SS genotype SCD. He has frequent pain crisis and seen in the ED or the [facility]. PMH of chronic anticoagulation therapy frequently subtheraputic,, hypokalemia, AVN shoulder, hypertension and mood disorder. He states he woke up in pain this AM around 1 and ran out of medication , also been out of Zyprexa resumed both.    Consultants:  None  Procedures:  None  Antibiotics:  None  HPI/Subjective: Pt complains of pain in bilateral lower extremity. Pain is 8/10. This is his usual location for his sickle cell pain crisis.  Objective: Filed Vitals:   05/31/12 2155 06/01/12 0411 06/01/12 0538 06/01/12 1013  BP: 140/78  130/78 140/98  Pulse: 95  64  73  Temp: 98.9 F (37.2 C)  98.3 F (36.8 C) 98.1 F (36.7 C)  TempSrc: Oral  Oral   Resp: 20 14 16 18   Height:      Weight:      SpO2: 95% 93% 98% 99%   Weight change:   Intake/Output Summary (Last 24 hours) at 06/01/12 1223 Last data filed at 06/01/12 0900  Gross per 24 hour  Intake 3804.17 ml  Output   3700 ml  Net 104.17 ml    General: Alert, awake, oriented x3, in no acute distress.  HEENT: [place]/AT PEERL, EOMI, anicteric Heart: Regular rate and rhythm, no murmurs rubs or gallops Lungs: Clear to auscultation, no wheezing rhonchi or rales. Abdomen: Soft, nontender, nondistended, positive bowel sounds, no guarding no rebound Neuro: No focal neurological deficits noted cranial nerves II through XII grossly intact.  Musculoskeletal: No warm swelling or erythema around joints Psychiatric: Patient seem in good spirit.    Data Reviewed: Basic Metabolic Panel:  Recent Labs Lab 05/25/12 2352 05/29/12 0530 05/31/12 1028 06/01/12 0541  NA 139 140 140 139  K 3.0* 2.9* 3.6 3.6  CL 106 109 109 108  CO2 22 20 23 22   GLUCOSE 101* 111* 108* 106*  BUN 5* 5* 4* 4*  CREATININE 0.72 0.56 0.47* 0.46*  CALCIUM 8.6 8.8 8.6 8.4   Liver Function Tests:  Recent Labs Lab 05/25/12 2352 05/29/12 0530 05/31/12 1028 06/01/12 0541  AST 23 30 34 31  ALT 11 10 13 16   ALKPHOS 76 74 75 77  BILITOT 4.7* 6.1* 4.8*  3.6*  PROT 7.1 7.2 6.9 6.5  ALBUMIN 3.8 3.8 3.6 3.4*    Recent Labs Lab 05/25/12 2352 05/29/12 0530 05/30/12 0555 05/31/12 1028 06/01/12 0541  WBC 18.4* 20.0* 19.0* 18.9* 15.3*  NEUTROABS 12.1* 14.2* 9.9* 11.0* 7.3  HGB 7.6* 7.6* 6.7* 7.4* 7.1*  HCT 21.9* 21.5* 19.7* 21.8* 21.2*  MCV 92.4 89.6 90.8 89.0 89.5  PLT 453* 409* 345 376 392     Recent Labs  06/07/11 0650 07/05/11 0620 07/18/11 1500  PROBNP 7.4 219.1* 13.5     Recent Results (from the past 240 hour(s))  MRSA PCR SCREENING     Status: None   Collection Time    05/30/12  3:50 PM      Result  Value Range Status   MRSA by PCR NEGATIVE  NEGATIVE Final   Comment:            The GeneXpert MRSA Assay (FDA     approved for NASAL specimens     only), is one component of a     comprehensive MRSA colonization     surveillance program. It is not     intended to diagnose MRSA     infection nor to guide or     monitor treatment for     MRSA infections.     Studies: Dg Chest 2 View  05/29/2012  *RADIOLOGY REPORT*  Clinical Data: Chest pain.  Shortness of breath.  History of sickle cell and previous pulmonary embolus.  CHEST - 2 VIEW  Comparison: 05/16/2012  Findings: Stable appearance of power port type central venous catheter.  Mild cardiac enlargement with normal pulmonary vascularity.  Peribronchial thickening and central interstitial changes suggesting chronic bronchitis and fibrosis.  No focal airspace consolidation or edema.  No blunting of costophrenic angles.  No pneumothorax.  Mediastinal contours appear intact. Minimal endplate changes in the spine consistent with history of sickle cell.  Surgical clips in the right upper quadrant.  No significant change since previous study.  IMPRESSION: No evidence of active pulmonary disease.  Mild cardiac enlargement with chronic bronchitic changes and mild interstitial fibrosis.   Original Report Authenticated By: Burman Nieves, M.D.    Dg Chest 2 View  05/16/2012  *RADIOLOGY REPORT*  Clinical Data: Sickle cell disease, chest pain with inspiration  CHEST - 2 VIEW  Comparison: 05/08/2012  Findings: Left subclavian Port-A-Cath stable with tip projecting over SVC. Enlargement of cardiac silhouette with pulmonary vascular congestion. Mediastinal contours stable. Minimal chronic basilar interstitial prominence, stable. No acute infiltrate, pleural effusion or pneumothorax. Mild central peribronchial thickening.  IMPRESSION: Enlargement of cardiac silhouette with pulmonary vascular congestion. Chronic bronchitic and minimal bibasilar changes. No acute  abnormalities.   Original Report Authenticated By: Ulyses Southward, M.D.    Dg Chest 2 View  05/08/2012  *RADIOLOGY REPORT*  Clinical Data: Sickle cell pain crisis.  CHEST - 2 VIEW  Comparison: Chest radiograph performed 04/06/2012, and CTA of the chest performed 03/01/2012  Findings: The lungs are well-aerated.  Mild chronic lung changes are again seen, with mildly increased interstitial markings.  No definite pleural effusion or pneumothorax is identified.  A left-sided chest port is noted ending about the distal SVC.  The heart is borderline normal in size; the mediastinal contour is within normal limits.  No acute osseous abnormalities are seen. Clips are noted within the right upper quadrant, reflecting prior cholecystectomy.  IMPRESSION: Mild chronic lung changes again seen; lungs otherwise clear.   Original Report Authenticated By: Tonia Ghent, M.D.  Scheduled Meds: . deferoxamine (DESFERAL) IV  2,000 mg Intravenous QHS  . folic acid  1 mg Oral Daily  . HYDROmorphone PCA 0.3 mg/mL   Intravenous Q4H  . hydroxyurea  500 mg Oral BID  . ketorolac  30 mg Intravenous Q6H  . lisinopril  10 mg Oral Q2200  . morphine  15 mg Oral Q12H  . OLANZapine  10 mg Oral QHS  . potassium chloride SA  20 mEq Oral BID  . senna-docusate  1 tablet Oral BID  . warfarin  5 mg Oral ONCE-1800  . Warfarin - Pharmacist Dosing Inpatient   Does not apply q1800   Continuous Infusions: . dextrose 5 % and 0.45% NaCl 125 mL/hr at 06/01/12 0824    Time spent 30 minutes

## 2012-06-01 NOTE — Progress Notes (Addendum)
ANTICOAGULATION CONSULT NOTE - Follow Up  Pharmacy Consult for Warfarin/Lovenox Indication: Hx PE, subtherapeutic INR  Allergies  Allergen Reactions  . Morphine And Related Hives and Rash    Pt states he is able to tolerate Dilaudid with no reactions.    Patient Measurements: Height: 6' (182.9 cm) Weight: 175 lb (79.379 kg) IBW/kg (Calculated) : 77.6  Vital Signs: Temp: 98.3 F (36.8 C) (05/02 0538) Temp src: Oral (05/02 0538) BP: 130/78 mmHg (05/02 0538) Pulse Rate: 64 (05/02 0538)  Labs:  Recent Labs  05/30/12 0555 05/31/12 0500 05/31/12 1028 06/01/12 0541  HGB 6.7*  --  7.4* 7.1*  HCT 19.7*  --  21.8* 21.2*  PLT 345  --  376 392  LABPROT 16.1* 16.8*  --  23.0*  INR 1.32 1.40  --  2.14*  CREATININE  --   --  0.47* 0.46*    Estimated Creatinine Clearance: 144.2 ml/min (by C-G formula based on Cr of 0.46).   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  INR subtherapeutic @ 1.27 on admission. Pharmacy asked to manage lovenox/warfarin bridge. Patient transferred to [facility] on 4/30.  INR noted to have been subtherapeutic the majority of time since early March 2014.   INR today has quickly reached goal with sharp increase in INR since yesterday 1.40 --> 2.14  No bleeding reported in chart notes   Goal of Therapy:  INR 2-3 Anti-Xa level 0.6-1.2 units/ml 4hrs after LMWH dose given Monitor platelets by anticoagulation protocol: Yes   Plan:  1) Warfarin 5 mg PO x1 2) Discontinue lovenox as INR is now  > 2 3.) Daily PT/INR  Clydene Fake PharmD Pager #: [phone] 9:04 AM 06/01/2012

## 2012-06-02 LAB — CBC
HCT: 20.2 % — ABNORMAL LOW (ref 39.0–52.0)
MCV: 89 fL (ref 78.0–100.0)
Platelets: 419 10*3/uL — ABNORMAL HIGH (ref 150–400)
RBC: 2.27 MIL/uL — ABNORMAL LOW (ref 4.22–5.81)
WBC: 10.6 10*3/uL — ABNORMAL HIGH (ref 4.0–10.5)

## 2012-06-02 LAB — PROTIME-INR: INR: 2.73 — ABNORMAL HIGH (ref 0.00–1.49)

## 2012-06-02 MED ORDER — WARFARIN SODIUM 5 MG PO TABS
5.0000 mg | ORAL_TABLET | Freq: Once | ORAL | Status: AC
Start: 2012-06-02 — End: 2012-06-02
  Administered 2012-06-02: 5 mg via ORAL
  Filled 2012-06-02: qty 1

## 2012-06-02 NOTE — Progress Notes (Signed)
ANTICOAGULATION CONSULT NOTE - Follow Up  Pharmacy Consult for Warfarin/Lovenox Indication: Hx PE, subtherapeutic INR  Allergies  Allergen Reactions  . Morphine And Related Hives and Rash    Pt states he is able to tolerate Dilaudid with no reactions.    Patient Measurements: Height: 6' (182.9 cm) Weight:  (pat. asleep) IBW/kg (Calculated) : 77.6  Vital Signs: Temp: 98.1 F (36.7 C) (05/03 1016) Temp src: Oral (05/03 1016) BP: 115/80 mmHg (05/03 1016) Pulse Rate: 71 (05/03 1016)  Labs:  Recent Labs  05/31/12 0500  05/31/12 1028 06/01/12 0541 06/02/12 0610  HGB  --   < > 7.4* 7.1* 7.0*  HCT  --   --  21.8* 21.2* 20.2*  PLT  --   --  376 392 419*  LABPROT 16.8*  --   --  23.0* 27.6*  INR 1.40  --   --  2.14* 2.73*  CREATININE  --   --  0.47* 0.46*  --   < > = values in this interval not displayed.  Estimated Creatinine Clearance: 144.2 ml/min (by C-G formula based on Cr of 0.46).   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  INR subtherapeutic @ 1.27 on admission. Pharmacy asked to manage lovenox/warfarin bridge. Patient transferred to [facility] on 4/30.  INR noted to have been subtherapeutic the majority of time since early March 2014.   INR therapeutic again today and continues to rise (1.4 -> 2.14 -> 2.73) after doses of 10, 10, 12.5, and 5mg  thus far inpatient  No bleeding reported in chart notes   Hgb low but stable due to [place] disease  Goal of Therapy:  INR 2-3   Plan:  1) Repeat warfarin 5 mg PO x1 2) Daily PT/INR   Hessie Knows, PharmD, BCPS Pager [phone] 06/02/2012 10:44 AM

## 2012-06-02 NOTE — Progress Notes (Addendum)
Subjective: Patient seen, states pain is better today, 6/10 by comparison to 8/10 yesterday. He states his pain is in his ribs and his back.  Objective: Weight change:   Intake/Output Summary (Last 24 hours) at 06/02/12 1058 Last data filed at 06/02/12 1017  Gross per 24 hour  Intake   2195 ml  Output   2975 ml  Net   -780 ml   BP 115/80  Pulse 71  Temp(Src) 98.1 F (36.7 C) (Oral)  Resp 16  Ht 6' (1.829 m)  Wt 79.379 kg (175 lb)  BMI 23.73 kg/m2  SpO2 97%  General Appearance:    Alert, cooperative, no distress, appears stated age  Head:    Normocephalic, without obvious abnormality, atraumatic  Eyes:    PERRL,     Nose:   Nares normal, septum midline, mucosa normal, no drainage    or sinus tenderness  Throat:   Lips, mucosa, and tongue normal; teeth and gums normal  Neck:   Supple, symmetrical, trachea midline, no adenopathy;       thyroid:  No enlargement/tenderness/nodules; no carotid   bruit or JVD  Back:     Symmetric, no curvature, ROM normal, no CVA tenderness  Lungs:     Clear to auscultation bilaterally, respirations unlabored  Chest wall:    No tenderness or deformity  Heart:    Regular rate and rhythm, S1 and S2 normal, no murmur, rub   or gallop  Abdomen:     Soft, non-tender, bowel sounds active all four quadrants,    no masses, no organomegaly        Extremities:   Extremities normal, atraumatic, no cyanosis or edema  Pulses:   2+ and symmetric all extremities  Skin:   Skin color, texture, turgor normal, no rashes or lesions  Lymph nodes:   Cervical, supraclavicular, and axillary nodes normal  Neurologic:   CNII-XII intact. Normal strength, sensation and reflexes      throughout    Lab Results:  Recent Labs  05/31/12 1028 06/01/12 0541  NA 140 139  K 3.6 3.6  CL 109 108  CO2 23 22  GLUCOSE 108* 106*  BUN 4* 4*  CREATININE 0.47* 0.46*  CALCIUM 8.6 8.4    Recent Labs  05/31/12 1028 06/01/12 0541  AST 34 31  ALT 13 16  ALKPHOS 75 77   BILITOT 4.8* 3.6*  PROT 6.9 6.5  ALBUMIN 3.6 3.4*   No results found for this basename: LIPASE, AMYLASE,  in the last 72 hours  Recent Labs  05/31/12 1028 06/01/12 0541 06/02/12 0610  WBC 18.9* 15.3* 10.6*  NEUTROABS 11.0* 7.3  --   HGB 7.4* 7.1* 7.0*  HCT 21.8* 21.2* 20.2*  MCV 89.0 89.5 89.0  PLT 376 392 419*     Micro Results: Recent Results (from the past 240 hour(s))  MRSA PCR SCREENING     Status: None   Collection Time    05/30/12  3:50 PM      Result Value Range Status   MRSA by PCR NEGATIVE  NEGATIVE Final   Comment:            The GeneXpert MRSA Assay (FDA     approved for NASAL specimens     only), is one component of a     comprehensive MRSA colonization     surveillance program. It is not     intended to diagnose MRSA     infection nor to guide or  monitor treatment for     MRSA infections.    Studies/Results: Dg Chest 2 View  05/29/2012  *RADIOLOGY REPORT*  Clinical Data: Chest pain.  Shortness of breath.  History of sickle cell and previous pulmonary embolus.  CHEST - 2 VIEW  Comparison: 05/16/2012  Findings: Stable appearance of power port type central venous catheter.  Mild cardiac enlargement with normal pulmonary vascularity.  Peribronchial thickening and central interstitial changes suggesting chronic bronchitis and fibrosis.  No focal airspace consolidation or edema.  No blunting of costophrenic angles.  No pneumothorax.  Mediastinal contours appear intact. Minimal endplate changes in the spine consistent with history of sickle cell.  Surgical clips in the right upper quadrant.  No significant change since previous study.  IMPRESSION: No evidence of active pulmonary disease.  Mild cardiac enlargement with chronic bronchitic changes and mild interstitial fibrosis.   Original Report Authenticated By: Burman Nieves, M.D.     Medications: Scheduled Meds: . deferoxamine (DESFERAL) IV  2,000 mg Intravenous QHS  . folic acid  1 mg Oral Daily  .  HYDROmorphone PCA 0.3 mg/mL   Intravenous Q4H  . hydroxyurea  500 mg Oral BID  . ketorolac  30 mg Intravenous Q6H  . lisinopril  10 mg Oral Q2200  . morphine  15 mg Oral Q12H  . OLANZapine  10 mg Oral QHS  . potassium chloride SA  20 mEq Oral BID  . senna-docusate  1 tablet Oral BID  . warfarin  5 mg Oral ONCE-1800  . Warfarin - Pharmacist Dosing Inpatient   Does not apply q1800   Continuous Infusions: . dextrose 5 % and 0.45% NaCl 125 mL/hr at 06/02/12 0610   PRN Meds:.diphenhydrAMINE, diphenhydrAMINE, HYDROmorphone (DILAUDID) injection, naloxone, ondansetron (ZOFRAN) IV, ondansetron, sodium chloride, zolpidem  Assessment/Plan: @PROBHOSP @  LOS: 4 days  Hb SS with Crisis: Patient continues to complain of pain in bilateral lower extremity. Improved today. Continue MS Contin,Toradol and IV Dilaudid PCA.  H/O PE: Patient is therapeutic on Coumadin today. Lovenox is discontinued. Pharmacy monitoring coumadin  Leukocytosis: Pt has a chronic leukocytosis, white blood cell count today is normalized today. Will monitor.   Mood Disorder. Pt is on Zyprexa that he takes at home. Continue Zyprexa.  Sickle cell anemia: Pt is on Hydrea.   Anemia: Patient's hemoglobin is 7.1 today. Baseline is approximately 7.5. No indication for transfusion at this time. Will monitor   Code Status: Full Code  Family Communication: N/A  Disposition Plan: Home hopefully in 48-72 hours   Catharina Pica 06/02/2012, 10:58 AM

## 2012-06-03 LAB — CBC
MCV: 89.1 fL (ref 78.0–100.0)
Platelets: 457 10*3/uL — ABNORMAL HIGH (ref 150–400)
RBC: 2.29 MIL/uL — ABNORMAL LOW (ref 4.22–5.81)
RDW: 21.9 % — ABNORMAL HIGH (ref 11.5–15.5)
WBC: 10.1 10*3/uL (ref 4.0–10.5)

## 2012-06-03 LAB — PROTIME-INR: INR: 2.35 — ABNORMAL HIGH (ref 0.00–1.49)

## 2012-06-03 MED ORDER — WARFARIN SODIUM 7.5 MG PO TABS
7.5000 mg | ORAL_TABLET | Freq: Once | ORAL | Status: AC
  Administered 2012-06-03: 7.5 mg via ORAL
  Filled 2012-06-03: qty 1

## 2012-06-03 NOTE — Progress Notes (Signed)
Appetite is good. Pt states his pain is a 6 and comes down to a 5. CBC drawn

## 2012-06-03 NOTE — Progress Notes (Signed)
Subjective: Patient seen, states pain better 5/10, under right rib. C/o itching request benadryl.  Objective: Weight change:   Intake/Output Summary (Last 24 hours) at 06/03/12 0840 Last data filed at 06/03/12 1610  Gross per 24 hour  Intake   1312 ml  Output   2875 ml  Net  -1563 ml   BP 114/76  Pulse 66  Temp(Src) 97.8 F (36.6 C) (Oral)  Resp 18  Ht 6' (1.829 m)  Wt 79.379 kg (175 lb)  BMI 23.73 kg/m2  SpO2 95%  General Appearance:    Alert, cooperative, no distress, appears stated age  Head:    Normocephalic, without obvious abnormality, atraumatic  Eyes:    PERRL   Ears:    Normal TM's and external ear canals, both ears  Nose:   Nares normal, septum midline, mucosa normal, no drainage    or sinus tenderness  Throat:   Lips, mucosa, and tongue normal; teeth and gums normal  Neck:   Supple, symmetrical, trachea midline, no adenopathy;       thyroid:  No enlargement/tenderness/nodules; no carotid   bruit or JVD  Back:     Symmetric, no curvature, ROM normal, no CVA tenderness  Lungs:     Clear to auscultation bilaterally, respirations unlabored  Chest wall:    Tenderness right lateral ribs/musculoskeletal region  Heart:    Regular rate and rhythm, S1 and S2 normal, no murmur, rub   or gallop  Abdomen:     Soft, non-tender, bowel sounds active all four quadrants,    no masses, no organomegaly        Extremities:   Extremities normal, atraumatic, no cyanosis or edema  Pulses:   2+ and symmetric all extremities  Skin:   Skin color, texture, turgor normal, no rashes or lesions     Neurologic:   CNII-XII intact. Normal strength, sensation and reflexes      throughout    Lab Results:  Recent Labs  05/31/12 1028 06/01/12 0541  NA 140 139  K 3.6 3.6  CL 109 108  CO2 23 22  GLUCOSE 108* 106*  BUN 4* 4*  CREATININE 0.47* 0.46*  CALCIUM 8.6 8.4    Recent Labs  05/31/12 1028 06/01/12 0541  AST 34 31  ALT 13 16  ALKPHOS 75 77  BILITOT 4.8* 3.6*  PROT 6.9  6.5  ALBUMIN 3.6 3.4*   No results found for this basename: LIPASE, AMYLASE,  in the last 72 hours  Recent Labs  05/31/12 1028 06/01/12 0541 06/02/12 0610  WBC 18.9* 15.3* 10.6*  NEUTROABS 11.0* 7.3  --   HGB 7.4* 7.1* 7.0*  HCT 21.8* 21.2* 20.2*  MCV 89.0 89.5 89.0  PLT 376 392 419*   No results found for this basename: CKTOTAL, CKMB, CKMBINDEX, TROPONINI,  in the last 72 hours No components found with this basename: POCBNP,  No results found for this basename: DDIMER,  in the last 72 hours No results found for this basename: HGBA1C,  in the last 72 hours No results found for this basename: CHOL, HDL, LDLCALC, TRIG, CHOLHDL, LDLDIRECT,  in the last 72 hours No results found for this basename: TSH, T4TOTAL, FREET3, T3FREE, THYROIDAB,  in the last 72 hours No results found for this basename: VITAMINB12, FOLATE, FERRITIN, TIBC, IRON, RETICCTPCT,  in the last 72 hours  Micro Results: Recent Results (from the past 240 hour(s))  MRSA PCR SCREENING     Status: None   Collection Time    05/30/12  3:50 PM      Result Value Range Status   MRSA by PCR NEGATIVE  NEGATIVE Final   Comment:            The GeneXpert MRSA Assay (FDA     approved for NASAL specimens     only), is one component of a     comprehensive MRSA colonization     surveillance program. It is not     intended to diagnose MRSA     infection nor to guide or     monitor treatment for     MRSA infections.    Studies/Results: Dg Chest 2 View  05/29/2012  *RADIOLOGY REPORT*  Clinical Data: Chest pain.  Shortness of breath.  History of sickle cell and previous pulmonary embolus.  CHEST - 2 VIEW  Comparison: 05/16/2012  Findings: Stable appearance of power port type central venous catheter.  Mild cardiac enlargement with normal pulmonary vascularity.  Peribronchial thickening and central interstitial changes suggesting chronic bronchitis and fibrosis.  No focal airspace consolidation or edema.  No blunting of costophrenic  angles.  No pneumothorax.  Mediastinal contours appear intact. Minimal endplate changes in the spine consistent with history of sickle cell.  Surgical clips in the right upper quadrant.  No significant change since previous study.  IMPRESSION: No evidence of active pulmonary disease.  Mild cardiac enlargement with chronic bronchitic changes and mild interstitial fibrosis.   Original Report Authenticated By: Burman Nieves, M.D.    Dg Chest 2 View  05/16/2012  *RADIOLOGY REPORT*  Clinical Data: Sickle cell disease, chest pain with inspiration  CHEST - 2 VIEW  Comparison: 05/08/2012  Findings: Left subclavian Port-A-Cath stable with tip projecting over SVC. Enlargement of cardiac silhouette with pulmonary vascular congestion. Mediastinal contours stable. Minimal chronic basilar interstitial prominence, stable. No acute infiltrate, pleural effusion or pneumothorax. Mild central peribronchial thickening.  IMPRESSION: Enlargement of cardiac silhouette with pulmonary vascular congestion. Chronic bronchitic and minimal bibasilar changes. No acute abnormalities.   Original Report Authenticated By: Ulyses Southward, M.D.    Dg Chest 2 View  05/08/2012  *RADIOLOGY REPORT*  Clinical Data: Sickle cell pain crisis.  CHEST - 2 VIEW  Comparison: Chest radiograph performed 04/06/2012, and CTA of the chest performed 03/01/2012  Findings: The lungs are well-aerated.  Mild chronic lung changes are again seen, with mildly increased interstitial markings.  No definite pleural effusion or pneumothorax is identified.  A left-sided chest port is noted ending about the distal SVC.  The heart is borderline normal in size; the mediastinal contour is within normal limits.  No acute osseous abnormalities are seen. Clips are noted within the right upper quadrant, reflecting prior cholecystectomy.  IMPRESSION: Mild chronic lung changes again seen; lungs otherwise clear.   Original Report Authenticated By: Tonia Ghent, M.D.     Medications: Scheduled Meds: . deferoxamine (DESFERAL) IV  2,000 mg Intravenous QHS  . folic acid  1 mg Oral Daily  . HYDROmorphone PCA 0.3 mg/mL   Intravenous Q4H  . hydroxyurea  500 mg Oral BID  . ketorolac  30 mg Intravenous Q6H  . lisinopril  10 mg Oral Q2200  . morphine  15 mg Oral Q12H  . OLANZapine  10 mg Oral QHS  . potassium chloride SA  20 mEq Oral BID  . senna-docusate  1 tablet Oral BID  . Warfarin - Pharmacist Dosing Inpatient   Does not apply q1800   Continuous Infusions: . dextrose 5 % and 0.45% NaCl 125 mL/hr at 06/02/12 1442  PRN Meds:.diphenhydrAMINE, diphenhydrAMINE, HYDROmorphone (DILAUDID) injection, naloxone, ondansetron (ZOFRAN) IV, ondansetron, sodium chloride, zolpidem  Assessment/Plan: @PROBHOSP @  LOS: 5 days   Hb SS with Crisis: Patient complains more of rib pains today. Pain improves daily. Continue MS Contin,Toradol and PCA Dilaudid pump. Change to PRN dilaudid tomorrow. H/O PE: Patient is therapeutic on Coumadin today. Lovenox is discontinued. Pharmacy monitoring coumadin  Leukocytosis: Pt has a chronic leukocytosis, white blood cell count today is normalized today. Will monitor.  Mood Disorder. Pt is on Zyprexa that he takes at home. Continue Zyprexa.  Sickle cell anemia: Pt is on Hydrea.  Anemia: Patient's hemoglobin is 7.0 today. Baseline is approximately 7.5. Slow downward trend. Repeat CBC in AM, no transfusion ordered.  Code Status: Full Code  Family Communication: N/A  Disposition Plan: Home hopefully in 48-72 hours  [name] 06/03/2012, 8:40 AM

## 2012-06-03 NOTE — Progress Notes (Signed)
ANTICOAGULATION CONSULT NOTE - Follow Up  Pharmacy Consult for Warfarin Indication: Hx PE  Allergies  Allergen Reactions  . Morphine And Related Hives and Rash    Pt states he is able to tolerate Dilaudid with no reactions.    Patient Measurements: Height: 6' (182.9 cm) Weight:  (pat. asleep) IBW/kg (Calculated) : 77.6  Vital Signs: Temp: 97.8 F (36.6 C) (05/04 0600) Temp src: Oral (05/04 0600) BP: 114/76 mmHg (05/04 0600) Pulse Rate: 66 (05/04 0600)  Labs:  Recent Labs  05/31/12 1028 06/01/12 0541 06/02/12 0610 06/03/12 0600  HGB 7.4* 7.1* 7.0*  --   HCT 21.8* 21.2* 20.2*  --   PLT 376 392 419*  --   LABPROT  --  23.0* 27.6* 24.7*  INR  --  2.14* 2.73* 2.35*  CREATININE 0.47* 0.46*  --   --     Estimated Creatinine Clearance: 144.2 ml/min (by C-G formula based on Cr of 0.46).   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  INR subtherapeutic @ 1.27 on admission. Pharmacy asked to manage lovenox/warfarin bridge. Patient transferred to [facility] on 4/30.  INR noted to have been subtherapeutic the majority of time since early March 2014.   INR therapeutic (1.4 -> 2.14 -> 2.73 -> 2.35) after doses of 10, 10, 12.5, 5, and 5mg  thus far inpatient  No bleeding reported in chart notes   Hgb low but stable due to [place] disease  Goal of Therapy:  INR 2-3   Plan:  1) Try home dose of 7.5mg  today 2) Daily PT/INR   Hessie Knows, PharmD, BCPS Pager [phone] 06/03/2012 10:22 AM

## 2012-06-04 LAB — DIFFERENTIAL
Eosinophils Relative: 8 % — ABNORMAL HIGH (ref 0–5)
Lymphocytes Relative: 23 % (ref 12–46)
Lymphs Abs: 2.7 10*3/uL (ref 0.7–4.0)
Monocytes Absolute: 2 10*3/uL — ABNORMAL HIGH (ref 0.1–1.0)
Monocytes Relative: 17 % — ABNORMAL HIGH (ref 3–12)

## 2012-06-04 LAB — CBC
MCV: 89.2 fL (ref 78.0–100.0)
Platelets: 516 10*3/uL — ABNORMAL HIGH (ref 150–400)
RBC: 2.5 MIL/uL — ABNORMAL LOW (ref 4.22–5.81)
RDW: 22.6 % — ABNORMAL HIGH (ref 11.5–15.5)
WBC: 11.6 10*3/uL — ABNORMAL HIGH (ref 4.0–10.5)

## 2012-06-04 LAB — PROTIME-INR: INR: 2.28 — ABNORMAL HIGH (ref 0.00–1.49)

## 2012-06-04 MED ORDER — WARFARIN SODIUM 7.5 MG PO TABS
7.5000 mg | ORAL_TABLET | Freq: Once | ORAL | Status: AC
  Administered 2012-06-04: 7.5 mg via ORAL
  Filled 2012-06-04: qty 1

## 2012-06-04 MED ORDER — HYDROMORPHONE HCL PF 2 MG/ML IJ SOLN
2.0000 mg | INTRAMUSCULAR | Status: DC | PRN
  Administered 2012-06-04 – 2012-06-05 (×4): 2 mg via INTRAVENOUS
  Filled 2012-06-04 (×4): qty 1

## 2012-06-04 MED ORDER — HYDROMORPHONE HCL 4 MG PO TABS
8.0000 mg | ORAL_TABLET | ORAL | Status: DC
  Administered 2012-06-04 – 2012-06-05 (×5): 8 mg via ORAL
  Filled 2012-06-04 (×5): qty 2

## 2012-06-04 NOTE — Progress Notes (Signed)
ANTICOAGULATION CONSULT NOTE - Follow Up  Pharmacy Consult for Warfarin Indication: Hx PE  Allergies  Allergen Reactions  . Morphine And Related Hives and Rash    Pt states he is able to tolerate Dilaudid with no reactions.    Patient Measurements: Height: 6' (182.9 cm) Weight:  (pat. asleep) IBW/kg (Calculated) : 77.6  Vital Signs: Temp: 98.1 F (36.7 C) (05/05 0549) Temp src: Oral (05/05 0549) BP: 117/76 mmHg (05/05 0549) Pulse Rate: 65 (05/05 0549)  Labs:  Recent Labs  06/02/12 0610 06/03/12 0600 06/03/12 1055 06/04/12 0650  HGB 7.0*  --  7.1* 7.5*  HCT 20.2*  --  20.4* 22.3*  PLT 419*  --  457* 516*  LABPROT 27.6* 24.7*  --  24.1*  INR 2.73* 2.35*  --  2.28*   Estimated Creatinine Clearance: 144.2 ml/min (by C-G formula based on Cr of 0.46).  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  INR subtherapeutic @ 1.27 on admission. Pharmacy asked to manage lovenox/warfarin bridge. Patient transferred to [facility] on 4/30.  INR noted to have been subtherapeutic the majority of time since early March 2014.   INR therapeutic (1.4 -> 2.14 -> 2.73 -> 2.35) after doses of 10, 10, 12.5, 5, and 5mg  thus far inpatient  No bleeding reported in chart notes   Hgb low but stable due to [place] disease  INR 2.28 today, tolerating diet  Goal of Therapy:  INR 2-3   Plan:   Coumadin 7.5mg  today  Continue daily PT/INR  Otho Bellows PharmD Pager [phone] 06/04/2012, 10:47 AM

## 2012-06-04 NOTE — Progress Notes (Signed)
SICKLE CELL SERVICE PROGRESS NOTE  [name] RUE:[id number] DOB: 05/29/1980 DOA: 05/29/2012 PCP: Willey Blade, MD  Assessment/Plan: Active Problems: 1. Hb SS with Crisis:  Pt is rating pain 6/10 but is functional. Will D/C PCA and transition to oral medications with IV for breakthrough.  Continue MS Contin. Pt was initially admitted to the [facility] and treated aggressively with IV analgesics and fluids. However, patient continued to have pain that limited function and was thus admitted to the hospital for further management of Acute VOC.Anticipate discharge  tomorrow. 2. H/O PE: Pt on chronic anticoagulation. INR now therapeutic and Coumadin being titrated by Pharmacy. Pt is frequently sub-therapeutic so will ask CM to investigate Lovenox or Xarelto as an alternative. 3. Leukocytosis: Pt has a chronic leukocytosis with baseline WBC at 15-18K.  4. Mood Disorder. Pt on Zyprexa and mood presently stable. 5. SCD: Pt is on Hydrea, however compliance is unclear. His MCV and WBC are not usual with use of Hydrea.     6. Anemia: Pt is at his baseline of 7.5.  Code Status: Full Code Family Communication: N/A Disposition Plan: Home hopefully tomorrow  [name] A.  Pager [phone]. If 7PM-7AM, please contact night-coverage.  06/04/2012, 6:28 PM  LOS: 6 days   Brief narrative: [name] present in the Ed this morning and was treated for [facility] and it was felt that he would benefit from more treatment in the [facility] and was transferred for 23 hors observation. He is a 32 year old African American Male with SS genotype SCD. He has frequent pain crisis and seen in the ED or the [facility] PMH of chronic anticoagulation therapy frequently subtheraputic,, hypokalemia, AVN shoulder, hypertension and mood disorder. He states he woke up in pain this AM around 1 and ran out of medication , also been out of Zyprexa resumed both. He has bilateral abdominal pain no provative or palliative factors associated with pain.  The pain is consistent aching sharpe and stabbing in nature. (9/10)    Consultants:  None  Procedures:  None  Antibiotics:  None  HPI/Subjective: Pt states that pain is improved and is rating pain 6/10 localized to ribs and legs, but is functional  He has no other associated symptoms. He had a BM yesterday.  Objective: Filed Vitals:   06/04/12 1158 06/04/12 1406 06/04/12 1541 06/04/12 1728  BP:  116/73  120/69  Pulse:  82  76  Temp:  98.8 F (37.1 C)  98.7 F (37.1 C)  TempSrc:  Oral  Oral  Resp: 19 19 21 20   Height:      Weight:      SpO2: 96% 96% 96% 96%   Weight change:   Intake/Output Summary (Last 24 hours) at 06/04/12 1828 Last data filed at 06/04/12 1729  Gross per 24 hour  Intake 5428.33 ml  Output   3800 ml  Net 1628.33 ml    General: Alert, awake, oriented x3, in no acute distress. Pleasant and well appearing. HEENT: [place]/AT PEERL, EOMI, anicteric Heart: Regular rate and rhythm, without murmurs, rubs, gallops, PMI non-displaced, no heaves or thrills on palpation.  Lungs: Clear to auscultation, no wheezing or rhonchi noted. No increased vocal fremitus resonant to percussion  Abdomen: Soft, nontender, nondistended, positive bowel sounds, no masses no hepatosplenomegaly noted.  Neuro: No focal neurological deficits noted cranial nerves II through XII grossly intact. Strength functional in bilateral upper and lower extremities. Musculoskeletal: No warm swelling or erythema around joints, no spinal tenderness noted. Psychiatric: Patient alert and  oriented x3, good insight and cognition, good recent to remote recall.    Data Reviewed: Basic Metabolic Panel:  Recent Labs Lab 05/29/12 0530 05/31/12 1028 06/01/12 0541  NA 140 140 139  K 2.9* 3.6 3.6  CL 109 109 108  CO2 20 23 22   GLUCOSE 111* 108* 106*  BUN 5* 4* 4*  CREATININE 0.56 0.47* 0.46*  CALCIUM 8.8 8.6 8.4   Liver Function Tests:  Recent Labs Lab 05/29/12 0530 05/31/12 1028  06/01/12 0541  AST 30 34 31  ALT 10 13 16   ALKPHOS 74 75 77  BILITOT 6.1* 4.8* 3.6*  PROT 7.2 6.9 6.5  ALBUMIN 3.8 3.6 3.4*   No results found for this basename: LIPASE, AMYLASE,  in the last 168 hours No results found for this basename: AMMONIA,  in the last 168 hours CBC:  Recent Labs Lab 05/29/12 0530 05/30/12 0555 05/31/12 1028 06/01/12 0541 06/02/12 0610 06/03/12 1055 06/04/12 0650  WBC 20.0* 19.0* 18.9* 15.3* 10.6* 10.1 11.6*  NEUTROABS 14.2* 9.9* 11.0* 7.3  --   --  5.8  HGB 7.6* 6.7* 7.4* 7.1* 7.0* 7.1* 7.5*  HCT 21.5* 19.7* 21.8* 21.2* 20.2* 20.4* 22.3*  MCV 89.6 90.8 89.0 89.5 89.0 89.1 89.2  PLT 409* 345 376 392 419* 457* 516*   Cardiac Enzymes: No results found for this basename: CKTOTAL, CKMB, CKMBINDEX, TROPONINI,  in the last 168 hours BNP (last 3 results)  Recent Labs  06/07/11 0650 07/05/11 0620 07/18/11 1500  PROBNP 7.4 219.1* 13.5   CBG: No results found for this basename: GLUCAP,  in the last 168 hours  Recent Results (from the past 240 hour(s))  MRSA PCR SCREENING     Status: None   Collection Time    05/30/12  3:50 PM      Result Value Range Status   MRSA by PCR NEGATIVE  NEGATIVE Final   Comment:            The GeneXpert MRSA Assay (FDA     approved for NASAL specimens     only), is one component of a     comprehensive MRSA colonization     surveillance program. It is not     intended to diagnose MRSA     infection nor to guide or     monitor treatment for     MRSA infections.     Studies: Dg Chest 2 View  05/29/2012  *RADIOLOGY REPORT*  Clinical Data: Chest pain.  Shortness of breath.  History of sickle cell and previous pulmonary embolus.  CHEST - 2 VIEW  Comparison: 05/16/2012  Findings: Stable appearance of power port type central venous catheter.  Mild cardiac enlargement with normal pulmonary vascularity.  Peribronchial thickening and central interstitial changes suggesting chronic bronchitis and fibrosis.  No focal airspace  consolidation or edema.  No blunting of costophrenic angles.  No pneumothorax.  Mediastinal contours appear intact. Minimal endplate changes in the spine consistent with history of sickle cell.  Surgical clips in the right upper quadrant.  No significant change since previous study.  IMPRESSION: No evidence of active pulmonary disease.  Mild cardiac enlargement with chronic bronchitic changes and mild interstitial fibrosis.   Original Report Authenticated By: Burman Nieves, M.D.    Dg Chest 2 View  05/16/2012  *RADIOLOGY REPORT*  Clinical Data: Sickle cell disease, chest pain with inspiration  CHEST - 2 VIEW  Comparison: 05/08/2012  Findings: Left subclavian Port-A-Cath stable with tip projecting over SVC. Enlargement of cardiac silhouette  with pulmonary vascular congestion. Mediastinal contours stable. Minimal chronic basilar interstitial prominence, stable. No acute infiltrate, pleural effusion or pneumothorax. Mild central peribronchial thickening.  IMPRESSION: Enlargement of cardiac silhouette with pulmonary vascular congestion. Chronic bronchitic and minimal bibasilar changes. No acute abnormalities.   Original Report Authenticated By: Ulyses Southward, M.D.    Dg Chest 2 View  05/08/2012  *RADIOLOGY REPORT*  Clinical Data: Sickle cell pain crisis.  CHEST - 2 VIEW  Comparison: Chest radiograph performed 04/06/2012, and CTA of the chest performed 03/01/2012  Findings: The lungs are well-aerated.  Mild chronic lung changes are again seen, with mildly increased interstitial markings.  No definite pleural effusion or pneumothorax is identified.  A left-sided chest port is noted ending about the distal SVC.  The heart is borderline normal in size; the mediastinal contour is within normal limits.  No acute osseous abnormalities are seen. Clips are noted within the right upper quadrant, reflecting prior cholecystectomy.  IMPRESSION: Mild chronic lung changes again seen; lungs otherwise clear.   Original Report  Authenticated By: Tonia Ghent, M.D.     Scheduled Meds: . deferoxamine (DESFERAL) IV  2,000 mg Intravenous QHS  . folic acid  1 mg Oral Daily  . HYDROmorphone  8 mg Oral Q4H  . hydroxyurea  500 mg Oral BID  . lisinopril  10 mg Oral Q2200  . morphine  15 mg Oral Q12H  . OLANZapine  10 mg Oral QHS  . potassium chloride SA  20 mEq Oral BID  . senna-docusate  1 tablet Oral BID  . Warfarin - Pharmacist Dosing Inpatient   Does not apply q1800   Continuous Infusions: . dextrose 5 % and 0.45% NaCl 125 mL/hr at 06/04/12 1609    Time spent 36 minutes

## 2012-06-04 NOTE — Progress Notes (Addendum)
Pt continues to use PCA pump for pain. Still rating his pain a 6/10. Appetite good.

## 2012-06-05 LAB — PROTIME-INR
INR: 2.35 — ABNORMAL HIGH (ref 0.00–1.49)
Prothrombin Time: 24.7 seconds — ABNORMAL HIGH (ref 11.6–15.2)

## 2012-06-05 MED ORDER — OLANZAPINE 10 MG PO TABS: 10.0000 mg | ORAL_TABLET | Freq: Every day | ORAL | Status: DC

## 2012-06-05 MED ORDER — WARFARIN SODIUM 7.5 MG PO TABS
7.5000 mg | ORAL_TABLET | Freq: Once | ORAL | Status: DC
  Filled 2012-06-05: qty 1

## 2012-06-05 MED ORDER — HEPARIN SOD (PORK) LOCK FLUSH 100 UNIT/ML IV SOLN
INTRAVENOUS | Status: AC
  Administered 2012-06-05: 11:00:00
  Filled 2012-06-05: qty 5

## 2012-06-05 MED ORDER — FOLIC ACID 1 MG PO TABS: 1.0000 mg | ORAL_TABLET | Freq: Every morning | ORAL | Status: DC

## 2012-06-05 MED ORDER — WARFARIN SODIUM 5 MG PO TABS: 7.5000 mg | ORAL_TABLET | Freq: Every evening | ORAL | Status: DC

## 2012-06-05 MED ORDER — HYDROMORPHONE HCL 4 MG PO TABS: ORAL_TABLET | ORAL | Status: DC

## 2012-06-05 MED ORDER — MORPHINE SULFATE ER 15 MG PO TBCR: 15.0000 mg | EXTENDED_RELEASE_TABLET | Freq: Two times a day (BID) | ORAL | Status: DC

## 2012-06-05 NOTE — Progress Notes (Signed)
ANTICOAGULATION CONSULT NOTE - Follow Up  Pharmacy Consult for Warfarin Indication: Hx PE  Allergies  Allergen Reactions  . Morphine And Related Hives and Rash    Pt states he is able to tolerate Dilaudid with no reactions.    Patient Measurements: Height: 6' (182.9 cm) Weight: 172 lb 4.8 oz (78.155 kg) IBW/kg (Calculated) : 77.6  Vital Signs: Temp: 98.2 F (36.8 C) (05/06 0700) Temp src: Oral (05/06 0700) BP: 114/68 mmHg (05/06 0700) Pulse Rate: 77 (05/06 0700)  Labs:  Recent Labs  06/03/12 0600 06/03/12 1055 06/04/12 0650 06/05/12 0500  HGB  --  7.1* 7.5*  --   HCT  --  20.4* 22.3*  --   PLT  --  457* 516*  --   LABPROT 24.7*  --  24.1* 24.7*  INR 2.35*  --  2.28* 2.35*   Estimated Creatinine Clearance: 144.2 ml/min (by C-G formula based on Cr of 0.46).  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  INR subtherapeutic @ 1.27 on admission. Pharmacy asked to manage lovenox/warfarin bridge. Patient transferred to [facility] on 4/30.  INR noted to have been subtherapeutic the majority of time since early March 2014.   No bleeding reported in chart notes   Hgb low but stable due to [place] disease  INR 2.35 today, tolerating diet  Goal of Therapy:  INR 2-3   Plan:   Coumadin 7.5mg  today  Continue daily PT/INR   Thank you for the consult.  Tomi Bamberger, PharmD Clinical Pharmacist Pager: [phone] 06/05/2012 8:34 AM

## 2012-06-05 NOTE — Discharge Summary (Signed)
[name] MRN: [id number] DOB/AGE: Jul 19, 1980 32 y.o.  Admit date: 05/29/2012 Discharge date: 06/05/2012  Primary Care Physician:  Gerald Schiller, MD   Discharge Diagnoses:   Patient Active Problem List   Diagnosis Date Noted  . 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 Sickle cell pain crisis 04/06/2012  . Pulmonary embolism with hypoxia 03/01/2012  . Mood disorder 01/21/2012  . Hypertension 01/21/2012  . Sickle cell anemia with crisis 12/14/2011  . 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 Sickle cell anemia   . 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 Avascular necrosis   . Thrombocytosis 02/07/2011  . Leukocytosis 12/17/2010  . Hypokalemia 12/17/2010    DISCHARGE MEDICATION:   Medication List    TAKE these medications       diphenhydrAMINE 25 mg capsule  Commonly known as:  BENADRYL  Take 1-2 capsules (25-50 mg total) by mouth every 4 (four) hours as needed for itching.     folic acid 1 MG tablet  Commonly known as:  FOLVITE  Take 1 tablet (1 mg total) by mouth every morning.     HYDROmorphone 4 MG tablet  Commonly known as:  DILAUDID  8 mg PO q 4 hours x 24 hours then 4 mg po q 4 hours as needed for severe pain.     hydroxyurea 500 MG capsule  Commonly known as:  HYDREA  Take 1 capsule (500 mg total) by mouth 2 (two) times daily. May take with food to minimize GI side effects.     ibuprofen 800 MG tablet  Commonly known as:  ADVIL,MOTRIN  Take 1 tablet (800 mg total) by mouth every 8 (eight) hours as needed for pain. Do not exceed use for more than 20/30 days     lisinopril 10 MG tablet  Commonly known as:  PRINIVIL,ZESTRIL  Take 1 tablet (10 mg total) by mouth daily at 10 pm.     morphine 15 MG 12 hr tablet  Commonly known as:  MS CONTIN  Take 1 tablet (15 mg total) by mouth 2 (two) times daily.     OLANZapine 10 MG tablet  Commonly known as:  ZYPREXA  Take 1 tablet (10 mg total) by mouth at bedtime.     potassium  chloride SA 20 MEQ tablet  Commonly known as:  K-DUR,KLOR-CON  Take 20 mEq by mouth daily.     warfarin 5 MG tablet  Commonly known as:  COUMADIN  Take 1.5 tablets (7.5 mg total) by mouth every evening. Take 7.5 every day. Walk in to check INR on Thursday 06/08/2010 before 11:00am.     zolpidem 10 MG tablet  Commonly known as:  AMBIEN  Take 1 tablet (10 mg total) by mouth at bedtime as needed. For sleep          Consults:     SIGNIFICANT DIAGNOSTIC STUDIES:  Dg Chest 2 View  05/29/2012  *RADIOLOGY REPORT*  Clinical Data: Chest pain.  Shortness of breath.  History of sickle cell and previous pulmonary embolus.  CHEST - 2 VIEW  Comparison: 05/16/2012  Findings: Stable appearance of power port type central venous catheter.  Mild cardiac enlargement with normal pulmonary vascularity.  Peribronchial thickening and central interstitial changes suggesting chronic bronchitis and fibrosis.  No focal airspace consolidation or edema.  No blunting of costophrenic angles.  No pneumothorax.  Mediastinal contours appear intact. Minimal endplate changes in the spine consistent with history of sickle cell.  Surgical clips in the right  upper quadrant.  No significant change since previous study.  IMPRESSION: No evidence of active pulmonary disease.  Mild cardiac enlargement with chronic bronchitic changes and mild interstitial fibrosis.   Original Report Authenticated By: Burman Nieves, M.D.    Dg Chest 2 View  05/16/2012  *RADIOLOGY REPORT*  Clinical Data: Sickle cell disease, chest pain with inspiration  CHEST - 2 VIEW  Comparison: 05/08/2012  Findings: Left subclavian Port-A-Cath stable with tip projecting over SVC. Enlargement of cardiac silhouette with pulmonary vascular congestion. Mediastinal contours stable. Minimal chronic basilar interstitial prominence, stable. No acute infiltrate, pleural effusion or pneumothorax. Mild central peribronchial thickening.  IMPRESSION: Enlargement of cardiac  silhouette with pulmonary vascular congestion. Chronic bronchitic and minimal bibasilar changes. No acute abnormalities.   Original Report Authenticated By: Ulyses Southward, M.D.    Dg Chest 2 View  05/08/2012  *RADIOLOGY REPORT*  Clinical Data: Sickle cell pain crisis.  CHEST - 2 VIEW  Comparison: Chest radiograph performed 04/06/2012, and CTA of the chest performed 03/01/2012  Findings: The lungs are well-aerated.  Mild chronic lung changes are again seen, with mildly increased interstitial markings.  No definite pleural effusion or pneumothorax is identified.  A left-sided chest port is noted ending about the distal SVC.  The heart is borderline normal in size; the mediastinal contour is within normal limits.  No acute osseous abnormalities are seen. Clips are noted within the right upper quadrant, reflecting prior cholecystectomy.  IMPRESSION: Mild chronic lung changes again seen; lungs otherwise clear.   Original Report Authenticated By: Tonia Ghent, M.D.       Recent Results (from the past 240 hour(s))  MRSA PCR SCREENING     Status: None   Collection Time    05/30/12  3:50 PM      Result Value Range Status   MRSA by PCR NEGATIVE  NEGATIVE Final   Comment:            The GeneXpert MRSA Assay (FDA     approved for NASAL specimens     only), is one component of a     comprehensive MRSA colonization     surveillance program. It is not     intended to diagnose MRSA     infection nor to guide or     monitor treatment for     MRSA infections.    BRIEF ADMITTING H & P: [name] present in the Ed this morning and was treated for [facility] and it was felt that he would benefit from more treatment in the [facility] and was transferred for 23 hors observation. He is a 32 year old African American Male with SS genotype SCD. He has frequent pain crisis and seen in the ED or the [facility]. PMH of chronic anticoagulation therapy frequently subtheraputic,, hypokalemia, AVN shoulder, hypertension and mood disorder. He  states he woke up in pain this AM around 1 and ran out of medication , also been out of Zyprexa resumed both. He has bilateral abdominal pain no provative or palliative factors associated with pain. The pain is consistent aching sharpe and stabbing in nature. (9/10)     Hospital Course:  Present on Admission:  Acute VOC:Pt was admitted after her was initially treated in the [facility] for an Acute VOC. He was initially treated with bolus doses of Dilaudid, Toradol and IVF. This was inadequate and he was transitioned to PCA. He was then transitioned to oral analgesics with IV for breakthrough pain and then discharged home in good condition. He  was given a prescription for MS Contin 15 mg #60 and Dilaudid 4 mg (2 week supply). He is advised to remain well hydrated. Pt was in good condition at the time of discharge.  HTN: Pt's BP was under good control at hte time of discharge. He is continued on his usual medications.  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 He is frequently sub-therapeutic on adequate dosing as determined by observation in the hospital. I have been unable to identify any dietary factors that may play a role in this phenomenon. Pt is to continue 7.5 mg daily and have her INR checked on Thursday 06/08/2102.  I will investigate the possibility of Xarelto for this patient to improve compliance and decrease risk of further VTE.  Mood Disorder: Pt has been  On Zyprexa but has been out of it for about one month. Will resume.  Anemia: Pt's Hb is at baseline of 7.5  Disposition and Follow-up:  Pt was discharged in good condition to home. He will follow up in the clinic in 2 weeks. He is to follow up in the clinic on Thursday to have INR checked ( Pt was given verbal and written instructions to that effect).     Discharge Orders   Future Appointments Provider Department Dept Phone   06/06/2012 11:00 AM [facility] [phone]    Future Orders Complete By Expires     Activity as tolerated - No restrictions  As directed     Diet - low sodium heart healthy  As directed        DISCHARGE EXAM:  General: Alert, awake, oriented x3, in no acute distress.Jovial and well appearing.  Vital Signs: BP 114/68, HR 77, T 98.2 F (36.8 C), temperature source Oral,RR 20, height 6' (1.829 m), weight 78.155 kg (172 lb 4.8 oz), SpO2 98.00%. HEENT: /AT PEERL, EOMI, anicteric  Heart: Regular rate and rhythm, without murmurs, rubs, gallops, PMI non-displaced, no heaves or thrills on palpation.  Lungs: Clear to auscultation, no wheezing or rhonchi noted. No increased vocal fremitus resonant to percussion  Abdomen: Soft, nontender, nondistended, positive bowel sounds, no masses no hepatosplenomegaly noted.  Neuro: No focal neurological deficits noted cranial nerves II through XII grossly intact. Strength functional in bilateral upper and lower extremities.  Musculoskeletal: No warm swelling or erythema around joints, no spinal tenderness noted.  Psychiatric: Patient alert and oriented x3, good insight and cognition, good recent to remote recall.     Recent Labs  06/03/12 1055 06/04/12 0650  WBC 10.1 11.6*  NEUTROABS  --  5.8  HGB 7.1* 7.5*  HCT 20.4* 22.3*  MCV 89.1 89.2  PLT 457* 516*   Total time for discharge process including face to face time and decision making was greater than 30 minutes.  Signed: Dallyn Bergland A. 06/05/2012, 10:30 AM

## 2012-06-06 NOTE — Progress Notes (Signed)
Pt seen and examined and discussed with NP Eliyanah Elgersma [name]. Agree with assessment and plan. 

## 2012-06-07 ENCOUNTER — Telehealth (HOSPITAL_COMMUNITY): Payer: Self-pay

## 2012-06-07 NOTE — Telephone Encounter (Signed)
Case Management Note: This CM spoke with [place] with [employer] regarding coverage for [employer]. Christy advised retail cost is approximately $3.60 for a 30 day supply and [facility] mail order cost is $3.60 for a 90 day supply.    Karoline Caldwell RN, BSN, [place]  [phone]

## 2012-06-07 NOTE — Telephone Encounter (Signed)
Case Management Note:  This CM spoke with [name] and his mother. [name] agreed to come in to get his labs drawn for PT/INR. Also patient should receive a sample bag of Xarelto to get him going until his mail order can be completed.   Karoline Caldwell RN, BSN, [place] [phone]

## 2012-06-12 ENCOUNTER — Ambulatory Visit (INDEPENDENT_AMBULATORY_CARE_PROVIDER_SITE_OTHER): Payer: Medicare Other | Admitting: Internal Medicine

## 2012-06-12 ENCOUNTER — Encounter: Payer: Self-pay | Admitting: Internal Medicine

## 2012-06-12 VITALS — BP 122/79 | HR 108 | Temp 98.2°F | Ht 72.0 in | Wt 178.0 lb

## 2012-06-12 DIAGNOSIS — D571 Sickle-cell disease without crisis: Secondary | ICD-10-CM | POA: Diagnosis not present

## 2012-06-12 DIAGNOSIS — I2699 Other pulmonary embolism without acute cor pulmonale: Secondary | ICD-10-CM | POA: Diagnosis not present

## 2012-06-12 MED ORDER — RIVAROXABAN 20 MG PO TABS: 20.0000 mg | ORAL_TABLET | Freq: Every day | ORAL | Status: DC

## 2012-06-12 NOTE — Patient Instructions (Addendum)
Please reschedule your Eye examination ASAP.  Return to clinic 2 days before next appointment for labs       Gastrointestinal Bleeding -GastProintestinal (GI) bleeding means there is bleeding somewhere along the digestive tract, between the mouth and anus. CAUSES  There are many different problems that can cause GI bleeding. Possible causes include:  Esophagitis. This is inflammation, irritation, or swelling of the esophagus.  Hemorrhoids.These are veins that are full of blood (engorged) in the rectum. They cause pain, inflammation, and may bleed.  Anal fissures.These are areas of painful tearing which may bleed. They are often caused by passing hard stool.  Diverticulosis.These are pouches that form on the colon over time, with age, and may bleed significantly.  Diverticulitis.This is inflammation in areas with diverticulosis. It can cause pain, fever, and bloody stools, although bleeding is rare.  Polyps and cancer. Colon cancer often starts out as precancerous polyps.  Gastritis and ulcers.Bleeding from the upper gastrointestinal tract (near the stomach) may travel through the intestines and produce black, sometimes tarry, often bad smelling stools. In certain cases, if the bleeding is fast enough, the stools may not be black, but red. This condition may be life-threatening. SYMPTOMS   Vomiting bright red blood or material that looks like coffee grounds.  Bloody, black, or tarry stools. DIAGNOSIS  Your caregiver may diagnose your condition by taking your history and performing a physical exam. More tests may be needed, including:  X-rays and other imaging tests.  Esophagogastroduodenoscopy (EGD). This test uses a flexible, lighted tube to look at your esophagus, stomach, and small intestine.  Colonoscopy. This test uses a flexible, lighted tube to look at your colon. TREATMENT  Treatment depends on the cause of your bleeding.   For bleeding from the esophagus,  stomach, small intestine, or colon, the caregiver doing your EGD or colonoscopy may be able to stop the bleeding as part of the procedure.  Inflammation or infection of the colon can be treated with medicines.  Many rectal problems can be treated with creams, suppositories, or warm baths.  Surgery is sometimes needed.  Blood transfusions are sometimes needed if you have lost a lot of blood. If bleeding is slow, you may be allowed to go home. If there is a lot of bleeding, you will need to stay in the hospital for observation. HOME CARE INSTRUCTIONS   Take any medicines exactly as prescribed.  Keep your stools soft by eating foods that are high in fiber. These foods include whole grains, legumes, fruits, and vegetables. Prunes (1 to 3 a day) work well for many people.  Drink enough fluids to keep your urine clear or pale yellow.  Take sitz baths if advised. A sitz bath is when you sit in a bathtub with warm water for 10 to 15 minutes to soak, soothe, and cleanse the rectal area. SEEK IMMEDIATE MEDICAL CARE IF:   Your bleeding increases.  You feel lightheaded, weak, or you faint.  You have severe cramps in your back or abdomen.  You pass large blood clots in your stool.  Your problems are getting worse. MAKE SURE YOU:   Understand these instructions.  Will watch your condition.  Will get help right away if you are not doing well or get worse. Document Released: 01/15/2000 Document Revised: 04/11/2011 Document Reviewed: 12/27/2010 [facility] Patient Information 2013 [place], [place].

## 2012-06-12 NOTE — Progress Notes (Signed)
Subjective:     Patient ID: [name], male   DOB: December 17, 1980, 32 y.o.   MRN: [id number]  HPI: Pt with a H/O PE who is almost always sub-therapeutic on Coumadin. Pt here for follow up to discuss changing management from Coumadin to Xarelto. Pt has had no SOB or DOE. He was at the beach and states that he feels well.   Review of Systems  Constitutional: Negative for fever and activity change.  Respiratory: Negative for shortness of breath.   Cardiovascular: Negative for chest pain.  Endocrine: Negative for cold intolerance and heat intolerance.  Musculoskeletal: Positive for back pain (At baseline 4/10). Negative for joint swelling.       Objective:   Physical Exam  Constitutional: He appears well-developed and well-nourished.  Eyes: EOM are normal. Pupils are equal, round, and reactive to light.  Cardiovascular: Normal rate, regular rhythm and normal heart sounds.  Exam reveals no gallop and no friction rub.   No murmur heard. Pulmonary/Chest: Effort normal and breath sounds normal.  Musculoskeletal: He exhibits no tenderness.  Psychiatric: He has a normal mood and affect.       Assessment:    Pt with H/O PE who has been on Coumadin but has ben unable ot maintain a therapeutic state. Pt has no subjective respiratory complaints and has a normal respiratory effort. He has a mild tachycardia, however this is his baseline.     Plan:     Will discontinue Coumadin and start on Xarelto 20 mg daily.  Pt to follow up in 1-2 weeks with NP for further management of SCD and weaning of opiate analgesics Need CBC with diff, CMET on nexs visit.

## 2012-06-14 NOTE — Telephone Encounter (Signed)
See above note

## 2012-06-15 ENCOUNTER — Encounter (HOSPITAL_COMMUNITY): Payer: Self-pay | Admitting: Emergency Medicine

## 2012-06-15 ENCOUNTER — Emergency Department (HOSPITAL_COMMUNITY)
Admission: EM | Admit: 2012-06-15 | Discharge: 2012-06-15 | Disposition: A | Discharge status: Home / Self Care | Payer: Medicare Other | Attending: Emergency Medicine | Admitting: Emergency Medicine

## 2012-06-15 DIAGNOSIS — Z87891 Personal history of nicotine dependence: Secondary | ICD-10-CM | POA: Diagnosis not present

## 2012-06-15 DIAGNOSIS — Z86711 Personal history of pulmonary embolism: Secondary | ICD-10-CM | POA: Diagnosis not present

## 2012-06-15 DIAGNOSIS — Z86718 Personal history of other venous thrombosis and embolism: Secondary | ICD-10-CM | POA: Diagnosis not present

## 2012-06-15 DIAGNOSIS — D5700 Hb-SS disease with crisis, unspecified: Secondary | ICD-10-CM | POA: Diagnosis not present

## 2012-06-15 DIAGNOSIS — Z862 Personal history of diseases of the blood and blood-forming organs and certain disorders involving the immune mechanism: Secondary | ICD-10-CM | POA: Insufficient documentation

## 2012-06-15 DIAGNOSIS — Z8659 Personal history of other mental and behavioral disorders: Secondary | ICD-10-CM | POA: Insufficient documentation

## 2012-06-15 DIAGNOSIS — I10 Essential (primary) hypertension: Secondary | ICD-10-CM | POA: Diagnosis not present

## 2012-06-15 DIAGNOSIS — Z8639 Personal history of other endocrine, nutritional and metabolic disease: Secondary | ICD-10-CM | POA: Insufficient documentation

## 2012-06-15 DIAGNOSIS — Z7901 Long term (current) use of anticoagulants: Secondary | ICD-10-CM | POA: Diagnosis not present

## 2012-06-15 DIAGNOSIS — Z79899 Other long term (current) drug therapy: Secondary | ICD-10-CM | POA: Diagnosis not present

## 2012-06-15 DIAGNOSIS — R079 Chest pain, unspecified: Secondary | ICD-10-CM | POA: Insufficient documentation

## 2012-06-15 LAB — CBC WITH DIFFERENTIAL/PLATELET
Basophils Relative: 1 % (ref 0–1)
Eosinophils Absolute: 0.9 10*3/uL — ABNORMAL HIGH (ref 0.0–0.7)
Eosinophils Relative: 4 % (ref 0–5)
Hemoglobin: 7.2 g/dL — ABNORMAL LOW (ref 13.0–17.0)
Lymphocytes Relative: 12 % (ref 12–46)
Neutrophils Relative %: 71 % (ref 43–77)
Platelets: 477 10*3/uL — ABNORMAL HIGH (ref 150–400)
RBC: 2.37 MIL/uL — ABNORMAL LOW (ref 4.22–5.81)

## 2012-06-15 LAB — COMPREHENSIVE METABOLIC PANEL
ALT: 27 U/L (ref 0–53)
AST: 35 U/L (ref 0–37)
Alkaline Phosphatase: 101 U/L (ref 39–117)
CO2: 24 mEq/L (ref 19–32)
Calcium: 9 mg/dL (ref 8.4–10.5)
GFR calc Af Amer: >90 mL/min (ref >90)
Glucose, Bld: 97 mg/dL (ref 70–99)
Potassium: 4 mEq/L (ref 3.5–5.1)
Sodium: 142 mEq/L (ref 135–145)
Total Protein: 7.2 g/dL (ref 6.0–8.3)

## 2012-06-15 LAB — RAPID URINE DRUG SCREEN, HOSP PERFORMED
Amphetamines: NOT DETECTED
Benzodiazepines: NOT DETECTED
Cocaine: NOT DETECTED
Opiates: POSITIVE — AB
Tetrahydrocannabinol: POSITIVE — AB

## 2012-06-15 LAB — RETICULOCYTES: Retic Count, Absolute: 464.5 10*3/uL — ABNORMAL HIGH (ref 19.0–186.0)

## 2012-06-15 MED ORDER — DIPHENHYDRAMINE HCL 50 MG/ML IJ SOLN
25.0000 mg | Freq: Once | INTRAMUSCULAR | Status: AC
  Administered 2012-06-15: 25 mg via INTRAVENOUS
  Filled 2012-06-15: qty 1

## 2012-06-15 MED ORDER — HYDROMORPHONE HCL PF 2 MG/ML IJ SOLN
2.0000 mg | Freq: Once | INTRAMUSCULAR | Status: AC
  Administered 2012-06-15: 2 mg via INTRAVENOUS
  Filled 2012-06-15: qty 1

## 2012-06-15 MED ORDER — HEPARIN SOD (PORK) LOCK FLUSH 100 UNIT/ML IV SOLN
INTRAVENOUS | Status: AC
  Administered 2012-06-15: 500 [IU]
  Filled 2012-06-15: qty 5

## 2012-06-15 MED ORDER — SODIUM CHLORIDE 0.9 % IV BOLUS (SEPSIS)
1000.0000 mL | Freq: Once | INTRAVENOUS | Status: AC
  Administered 2012-06-15: 1000 mL via INTRAVENOUS

## 2012-06-15 MED ORDER — SODIUM CHLORIDE 0.9 % IV BOLUS (SEPSIS)
250.0000 mL | Freq: Once | INTRAVENOUS | Status: AC
  Administered 2012-06-15: 250 mL via INTRAVENOUS

## 2012-06-15 NOTE — ED Notes (Signed)
Pt put on 2L [place] d/t O2 sat being 89%.  Pt's O2 sat now 92%.

## 2012-06-15 NOTE — ED Provider Notes (Signed)
History     CSN: [id number]  Arrival date & time 06/15/12  0018   First MD Initiated Contact with Patient 06/15/12 0044      Chief Complaint  Patient presents with  . Sickle Cell Pain Crisis    (Consider location/radiation/quality/duration/timing/severity/associated sxs/prior treatment) HPI Comments: Patient with frequent SCC now with bilateral  side pain not relieved by is home medications  Denies any fevers, cough  Patient is a 32 y.o. male presenting with sickle cell pain. The history is provided by the patient.  Sickle Cell Pain Crisis  This is a recurrent problem. The current episode started today. The problem occurs frequently. The problem has been unchanged. The pain is present in the right side and left side. Site of pain is localized in muscle. The pain is similar to prior episodes. The pain is severe. Nothing relieves the symptoms. The symptoms are not relieved by one or more prescription drugs and rest. Associated symptoms include chest pain. Pertinent negatives include no nausea, no vomiting, no congestion, no headaches, no rhinorrhea, no sore throat, no cough, no difficulty breathing and no rash. He has been eating and drinking normally. He sickle cell is of an an unknown genotype. There is a history of acute chest syndrome. There have been frequent pain crises. 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 There is no history of stroke. He has been treated with hydroxyurea. There were no sick contacts.    Past Medical History  Diagnosis Date  . Sickle cell anemia   . Blood transfusion   . 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 Hypokalemia   . Mood disorder   . Pulmonary embolism   . Avascular necrosis   . Leukocytosis     Chronic  . Thrombocytosis     Chronic  . Hypertension     Past Surgical History  Procedure Laterality Date  . Right hip replacement      08/2006  . Cholecystectomy      01/2008  . Porta cath placement    . Porta  cath removal    . Umbilical hernia repair      01/2008  . Excision of left periauricular cyst      10/2009  . Excision of right ear lobe cyst with primary closur      11/2007  . Portacath placement  01/05/2012    Procedure: INSERTION PORT-A-CATH;  Surgeon: Adolph Pollack, MD;  Location: [facility] OR;  Service: General;  Laterality: N/A;  ultrasound guiced port a cath insertion with fluoroscopy    Family History  Problem Relation Age of Onset  . Sickle cell anemia Mother   . Sickle cell anemia Father   . Sickle cell trait Brother     History  Substance Use Topics  . Smoking status: Former Smoker -- 13 years    Types: Cigarettes    Quit date: 07/08/2010  . Smokeless tobacco: Never Used  . Alcohol Use: No      Review of Systems  Constitutional: Negative for fever and chills.  HENT: Negative for congestion, sore throat and rhinorrhea.   Respiratory: Negative for cough and shortness of breath.   Cardiovascular: Positive for chest pain.  Gastrointestinal: Negative for nausea and vomiting.  Musculoskeletal: Negative for myalgias and joint swelling.  Skin: Negative for rash and wound.  Neurological: Negative for headaches.  All other systems reviewed and are negative.    Allergies  Morphine and related  Home Medications   Current  Outpatient Rx  Name  Route  Sig  Dispense  Refill  . diphenhydrAMINE (BENADRYL) 25 mg capsule   Oral   Take 1-2 capsules (25-50 mg total) by mouth every 4 (four) hours as needed for itching.   30 capsule   0   . folic acid (FOLVITE) 1 MG tablet   Oral   Take 1 tablet (1 mg total) by mouth every morning.   30 tablet   11   . HYDROmorphone (DILAUDID) 4 MG tablet   Oral   Take 4 mg by mouth every 4 (four) hours as needed for pain.         . hydroxyurea (HYDREA) 500 MG capsule   Oral   Take 1 capsule (500 mg total) by mouth 2 (two) times daily. May take with food to minimize GI side effects.   60 capsule   2   . ibuprofen (ADVIL,MOTRIN)  800 MG tablet   Oral   Take 1 tablet (800 mg total) by mouth every 8 (eight) hours as needed for pain. Do not exceed use for more than 20/30 days   30 tablet   0   . lisinopril (PRINIVIL,ZESTRIL) 10 MG tablet   Oral   Take 1 tablet (10 mg total) by mouth daily at 10 pm.   30 tablet   2   . morphine (MS CONTIN) 15 MG 12 hr tablet   Oral   Take 1 tablet (15 mg total) by mouth 2 (two) times daily.   60 tablet   0   . OLANZapine (ZYPREXA) 10 MG tablet   Oral   Take 1 tablet (10 mg total) by mouth at bedtime.   30 tablet   3   . potassium chloride SA (K-DUR,KLOR-CON) 20 MEQ tablet   Oral   Take 20 mEq by mouth every morning.          . zolpidem (AMBIEN) 10 MG tablet   Oral   Take 1 tablet (10 mg total) by mouth at bedtime as needed. For sleep   30 tablet   0   . Rivaroxaban (XARELTO) 20 MG TABS   Oral   Take 1 tablet (20 mg total) by mouth daily.   30 tablet   1   . warfarin (COUMADIN) 7.5 MG tablet   Oral   Take 7.5 mg by mouth every morning.           BP 133/87  Pulse 90  Temp(Src) 98.9 F (37.2 C) (Oral)  Resp 18  SpO2 94%  Physical Exam  Nursing note and vitals reviewed. Constitutional: He is oriented to person, place, and time. He appears well-developed and well-nourished.  HENT:  Head: Normocephalic.  Eyes: Pupils are equal, round, and reactive to light.  Neck: Normal range of motion.  Cardiovascular: Regular rhythm.  Tachycardia present.   Pulmonary/Chest: Effort normal. He exhibits tenderness.  Abdominal: Bowel sounds are normal. He exhibits no distension. There is no tenderness.  Musculoskeletal: He exhibits no tenderness.  Neurological: He is alert and oriented to person, place, and time.  Skin: There is pallor.    ED Course  Procedures (including critical care time)  Labs Reviewed  CBC WITH DIFFERENTIAL - Abnormal; Notable for the following:    WBC 22.0 (*)    RBC 2.37 (*)    Hemoglobin 7.2 (*)    HCT 21.9 (*)    RDW 22.6 (*)     Platelets 477 (*)    Neutro Abs 15.7 (*)  Monocytes Absolute 2.6 (*)    Eosinophils Absolute 0.9 (*)    Basophils Absolute 0.2 (*)    All other components within normal limits  COMPREHENSIVE METABOLIC PANEL - Abnormal; Notable for the following:    BUN 5 (*)    Total Bilirubin 3.5 (*)    All other components within normal limits  RETICULOCYTES - Abnormal; Notable for the following:    Retic Ct Pct 19.6 (*)    RBC. 2.37 (*)    Retic Count, Manual 464.5 (*)    All other components within normal limits  URINE RAPID DRUG SCREEN (HOSP PERFORMED) - Abnormal; Notable for the following:    Opiates POSITIVE (*)    Tetrahydrocannabinol POSITIVE (*)    All other components within normal limits   No results found.   1. Sickle cell crisis       MDM   After 6 mg Dilaudid patient feeling better and thinks he can manage pain at home         Arman Filter, NP 06/15/12 0355  Arman Filter, NP 06/15/12 1914

## 2012-06-15 NOTE — ED Notes (Signed)
Pt is c/o bilateral side pain and back pain  Pt has sickle cell  Pt states pain started this morning

## 2012-06-15 NOTE — ED Provider Notes (Signed)
Medical screening examination/treatment/procedure(s) were performed by non-physician practitioner and as supervising physician I was immediately available for consultation/collaboration.  Jones Skene, M.D.     Jones Skene, MD 06/15/12 [phone]

## 2012-06-20 ENCOUNTER — Emergency Department (HOSPITAL_COMMUNITY)
Admission: EM | Admit: 2012-06-20 | Discharge: 2012-06-21 | Disposition: A | Discharge status: Home / Self Care | Payer: Medicare Other | Attending: Emergency Medicine | Admitting: Emergency Medicine

## 2012-06-20 ENCOUNTER — Emergency Department (HOSPITAL_COMMUNITY): Payer: Medicare Other

## 2012-06-20 ENCOUNTER — Encounter (HOSPITAL_COMMUNITY): Payer: Self-pay | Admitting: *Deleted

## 2012-06-20 DIAGNOSIS — I10 Essential (primary) hypertension: Secondary | ICD-10-CM | POA: Diagnosis not present

## 2012-06-20 DIAGNOSIS — Z8659 Personal history of other mental and behavioral disorders: Secondary | ICD-10-CM | POA: Insufficient documentation

## 2012-06-20 DIAGNOSIS — Z86711 Personal history of pulmonary embolism: Secondary | ICD-10-CM | POA: Insufficient documentation

## 2012-06-20 DIAGNOSIS — IMO0001 Reserved for inherently not codable concepts without codable children: Secondary | ICD-10-CM | POA: Insufficient documentation

## 2012-06-20 DIAGNOSIS — Z862 Personal history of diseases of the blood and blood-forming organs and certain disorders involving the immune mechanism: Secondary | ICD-10-CM | POA: Diagnosis not present

## 2012-06-20 DIAGNOSIS — Z8679 Personal history of other diseases of the circulatory system: Secondary | ICD-10-CM | POA: Diagnosis not present

## 2012-06-20 DIAGNOSIS — D5700 Hb-SS disease with crisis, unspecified: Secondary | ICD-10-CM | POA: Insufficient documentation

## 2012-06-20 DIAGNOSIS — J42 Unspecified chronic bronchitis: Secondary | ICD-10-CM | POA: Diagnosis not present

## 2012-06-20 DIAGNOSIS — E876 Hypokalemia: Secondary | ICD-10-CM | POA: Diagnosis not present

## 2012-06-20 DIAGNOSIS — Z87891 Personal history of nicotine dependence: Secondary | ICD-10-CM | POA: Diagnosis not present

## 2012-06-20 DIAGNOSIS — R110 Nausea: Secondary | ICD-10-CM | POA: Insufficient documentation

## 2012-06-20 DIAGNOSIS — Z86718 Personal history of other venous thrombosis and embolism: Secondary | ICD-10-CM | POA: Diagnosis not present

## 2012-06-20 DIAGNOSIS — Z79899 Other long term (current) drug therapy: Secondary | ICD-10-CM | POA: Diagnosis not present

## 2012-06-20 LAB — COMPREHENSIVE METABOLIC PANEL
BUN: 5 mg/dL — ABNORMAL LOW (ref 6–23)
CO2: 23 mEq/L (ref 19–32)
Calcium: 8.9 mg/dL (ref 8.4–10.5)
Chloride: 106 mEq/L (ref 96–112)
Creatinine, Ser: 0.75 mg/dL (ref 0.50–1.35)
GFR calc Af Amer: >90 mL/min (ref >90)
GFR calc non Af Amer: >90 mL/min (ref >90)
Total Bilirubin: 4.6 mg/dL — ABNORMAL HIGH (ref 0.3–1.2)

## 2012-06-20 LAB — PROTIME-INR
INR: 2 — ABNORMAL HIGH (ref 0.00–1.49)
Prothrombin Time: 21.9 seconds — ABNORMAL HIGH (ref 11.6–15.2)

## 2012-06-20 LAB — CBC WITH DIFFERENTIAL/PLATELET
Basophils Absolute: 0.2 10*3/uL — ABNORMAL HIGH (ref 0.0–0.1)
Eosinophils Absolute: 0.6 10*3/uL (ref 0.0–0.7)
Eosinophils Relative: 4 % (ref 0–5)
Lymphs Abs: 3.1 10*3/uL (ref 0.7–4.0)
MCH: 30 pg (ref 26.0–34.0)
MCHC: 32.9 g/dL (ref 30.0–36.0)
MCV: 91.3 fL (ref 78.0–100.0)
Monocytes Absolute: 2.3 10*3/uL — ABNORMAL HIGH (ref 0.1–1.0)
Neutrophils Relative %: 60 % (ref 43–77)
Platelets: 460 10*3/uL — ABNORMAL HIGH (ref 150–400)
RBC: 2.53 MIL/uL — ABNORMAL LOW (ref 4.22–5.81)
RDW: 21.3 % — ABNORMAL HIGH (ref 11.5–15.5)

## 2012-06-20 LAB — RETICULOCYTES: Retic Count, Absolute: 369.4 10*3/uL — ABNORMAL HIGH (ref 19.0–186.0)

## 2012-06-20 MED ORDER — HYDROMORPHONE HCL PF 2 MG/ML IJ SOLN
2.0000 mg | Freq: Once | INTRAMUSCULAR | Status: AC
  Administered 2012-06-21: 2 mg via INTRAVENOUS
  Filled 2012-06-20: qty 1

## 2012-06-20 MED ORDER — HYDROMORPHONE HCL PF 2 MG/ML IJ SOLN
2.0000 mg | Freq: Once | INTRAMUSCULAR | Status: AC
  Administered 2012-06-20: 2 mg via INTRAVENOUS
  Filled 2012-06-20: qty 1

## 2012-06-20 MED ORDER — DIPHENHYDRAMINE HCL 50 MG/ML IJ SOLN
25.0000 mg | Freq: Once | INTRAMUSCULAR | Status: AC
  Administered 2012-06-21: 25 mg via INTRAVENOUS
  Filled 2012-06-20: qty 1

## 2012-06-20 MED ORDER — SODIUM CHLORIDE 0.9 % IV BOLUS (SEPSIS)
1000.0000 mL | Freq: Once | INTRAVENOUS | Status: AC
  Administered 2012-06-20: 1000 mL via INTRAVENOUS

## 2012-06-20 MED ORDER — ONDANSETRON HCL 4 MG/2ML IJ SOLN
4.0000 mg | INTRAMUSCULAR | Status: AC
  Administered 2012-06-20: 4 mg via INTRAVENOUS
  Filled 2012-06-20: qty 2

## 2012-06-20 MED ORDER — DIPHENHYDRAMINE HCL 50 MG/ML IJ SOLN
25.0000 mg | Freq: Once | INTRAMUSCULAR | Status: AC
  Administered 2012-06-20: 25 mg via INTRAVENOUS
  Filled 2012-06-20: qty 1

## 2012-06-20 NOTE — ED Notes (Signed)
Pt requesting additional pain medication and Benadryl

## 2012-06-20 NOTE — ED Provider Notes (Signed)
History     CSN: [id number]  Arrival date & time 06/20/12  1946   First MD Initiated Contact with Patient 06/20/12 2028      Chief Complaint  Patient presents with  . Sickle Cell Pain Crisis    (Consider location/radiation/quality/duration/timing/severity/associated sxs/prior treatment) HPI Comments: Patient is a 32 year old male with a history of sickle cell anemia, pulmonary embolism, and hypertension who presents for myalgias of his chest, bilateral thighs, and lower back. Patient states that pain began this morning and has been constant since onset. Patient took Dilaudid and MS Contin at home without relief of symptoms. Patient states that stretching mildly improves his pain. Patient admits to associated nausea and denies fevers, syncope, vision changes, central chest pain, shortness of breath, abdominal pain, vomiting or diarrhea, and numbness or tingling in his extremities. Patient states that this pain feels like one of his "typical" sickle cell pain crises. PCP - Marthann Schiller at [facility].  Patient is a 32 y.o. male presenting with sickle cell pain. The history is provided by the patient. No language interpreter was used.  Sickle Cell Pain Crisis Associated symptoms: nausea   Associated symptoms: no chest pain, no fever, no shortness of breath and no vomiting     Past Medical History  Diagnosis Date  . Sickle cell anemia   . Blood transfusion   . 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 Hypokalemia   . Mood disorder   . Pulmonary embolism   . Avascular necrosis   . Leukocytosis     Chronic  . Thrombocytosis     Chronic  . Hypertension     Past Surgical History  Procedure Laterality Date  . Right hip replacement      08/2006  . Cholecystectomy      01/2008  . Porta cath placement    . Porta cath removal    . Umbilical hernia repair      01/2008  . Excision of left periauricular cyst      10/2009  . Excision of right  ear lobe cyst with primary closur      11/2007  . Portacath placement  01/05/2012    Procedure: INSERTION PORT-A-CATH;  Surgeon: Adolph Pollack, MD;  Location: [facility] OR;  Service: General;  Laterality: N/A;  ultrasound guiced port a cath insertion with fluoroscopy    Family History  Problem Relation Age of Onset  . Sickle cell anemia Mother   . Sickle cell anemia Father   . Sickle cell trait Brother     History  Substance Use Topics  . Smoking status: Former Smoker -- 13 years    Types: Cigarettes    Quit date: 07/08/2010  . Smokeless tobacco: Never Used  . Alcohol Use: No     Review of Systems  Constitutional: Negative for fever.  Eyes: Negative for visual disturbance.  Respiratory: Negative for shortness of breath.        +chest myalgias  Cardiovascular: Negative for chest pain.  Gastrointestinal: Positive for nausea. Negative for vomiting, abdominal pain and diarrhea.  Musculoskeletal: Positive for myalgias.  Neurological: Negative for weakness, light-headedness and numbness.  All other systems reviewed and are negative.    Allergies  Morphine and related  Home Medications   Current Outpatient Rx  Name  Route  Sig  Dispense  Refill  . diphenhydrAMINE (BENADRYL) 25 mg capsule   Oral   Take 1-2 capsules (25-50 mg total) by mouth every 4 (  four) hours as needed for itching.   30 capsule   0   . folic acid (FOLVITE) 1 MG tablet   Oral   Take 1 tablet (1 mg total) by mouth every morning.   30 tablet   11   . HYDROmorphone (DILAUDID) 4 MG tablet   Oral   Take 4 mg by mouth every 4 (four) hours as needed for pain.         . hydroxyurea (HYDREA) 500 MG capsule   Oral   Take 1 capsule (500 mg total) by mouth 2 (two) times daily. May take with food to minimize GI side effects.   60 capsule   2   . ibuprofen (ADVIL,MOTRIN) 800 MG tablet   Oral   Take 1 tablet (800 mg total) by mouth every 8 (eight) hours as needed for pain. Do not exceed use for more than  20/30 days   30 tablet   0   . lisinopril (PRINIVIL,ZESTRIL) 10 MG tablet   Oral   Take 1 tablet (10 mg total) by mouth daily at 10 pm.   30 tablet   2   . morphine (MS CONTIN) 15 MG 12 hr tablet   Oral   Take 1 tablet (15 mg total) by mouth 2 (two) times daily.   60 tablet   0   . OLANZapine (ZYPREXA) 10 MG tablet   Oral   Take 1 tablet (10 mg total) by mouth at bedtime.   30 tablet   3   . potassium chloride SA (K-DUR,KLOR-CON) 20 MEQ tablet   Oral   Take 20 mEq by mouth every morning.          . Rivaroxaban (XARELTO) 20 MG TABS   Oral   Take 20 mg by mouth every morning.         . zolpidem (AMBIEN) 10 MG tablet   Oral   Take 1 tablet (10 mg total) by mouth at bedtime as needed. For sleep   30 tablet   0     BP 115/77  Pulse 85  Temp(Src) 97.4 F (36.3 C) (Oral)  Resp 18  SpO2 95%  Physical Exam  Nursing note and vitals reviewed. Constitutional: He is oriented to person, place, and time. He appears well-developed and well-nourished. No distress.  HENT:  Head: Normocephalic and atraumatic.  Mouth/Throat: Oropharynx is clear and moist. No oropharyngeal exudate.  Eyes: Conjunctivae and EOM are normal. Pupils are equal, round, and reactive to light. No scleral icterus.  Neck: Normal range of motion. Neck supple.  Cardiovascular: Normal rate, regular rhythm, normal heart sounds and intact distal pulses.   HR in 90's  Pulmonary/Chest: Effort normal and breath sounds normal. No respiratory distress. He has no wheezes. He has no rales.  Abdominal: Soft. He exhibits no distension and no mass. There is no tenderness. There is no rebound and no guarding.  Musculoskeletal: Normal range of motion. He exhibits tenderness (generalized myalgias).  Lymphadenopathy:    He has no cervical adenopathy.  Neurological: He is alert and oriented to person, place, and time.  Skin: Skin is warm and dry. No rash noted. He is not diaphoretic. No erythema. No pallor.   Psychiatric: He has a normal mood and affect. His behavior is normal.    ED Course  Procedures (including critical care time)  Labs Reviewed  CBC WITH DIFFERENTIAL - Abnormal; Notable for the following:    WBC 15.5 (*)    RBC 2.53 (*)  Hemoglobin 7.6 (*)    HCT 23.1 (*)    RDW 21.3 (*)    Platelets 460 (*)    Monocytes Relative 15 (*)    Neutro Abs 9.3 (*)    Monocytes Absolute 2.3 (*)    Basophils Absolute 0.2 (*)    All other components within normal limits  RETICULOCYTES - Abnormal; Notable for the following:    Retic Ct Pct 14.6 (*)    RBC. 2.53 (*)    Retic Count, Manual 369.4 (*)    All other components within normal limits  COMPREHENSIVE METABOLIC PANEL - Abnormal; Notable for the following:    Potassium 3.4 (*)    Glucose, Bld 105 (*)    BUN 5 (*)    Total Bilirubin 4.6 (*)    All other components within normal limits  PROTIME-INR - Abnormal; Notable for the following:    Prothrombin Time 21.9 (*)    INR 2.00 (*)    All other components within normal limits   Dg Chest 2 View  06/20/2012   *RADIOLOGY REPORT*  Clinical Data: Shortness of breath.  CHEST - 2 VIEW  Comparison: 05/29/2012.  Findings: The power port is stable.  The heart is borderline enlarged but unchanged.  The mediastinal and hilar contours are stable.  There are chronic bronchitic type interstitial lung changes but no infiltrates, edema or effusions.  The bony thorax is intact.  IMPRESSION:  Chronic bronchitic type interstitial lung changes but no acute overlying pulmonary process.   Original Report Authenticated By: Rudie Meyer, M.D.    1. Sickle cell pain crisis     MDM  Patient is a 32 year old male with a history of sickle cell anemia who presents for sickle cell pain crisis. Labs largely c/w prior work ups and reticulocyte count improved from 6 days ago; 369 from 464. PT/INR therapeutic. CXR without evidence of acute cardiopulmonary process. IV dilaudid, benadryl, Zofran, and IVF  ordered.  22:00 - Per nurse, patient requesting additional pain medicine. Second dose of 2mg  Dilaudid ordered for pain control.  23:30 - Third dose of dilaudid 2mg  and second dose of benadryl ordered. Patient states pain decreased to 7/10; states he does not want admission. Will continue to monitor and reassess.  1:00 - Fourth, and final, dose of 2mg  Dilaudid ordered; patient believes will adequately pain and further pain control able to be done at home. Plan to d/c with PCP follow up for further evaluation of symptoms. Patient verbalizes comfort and understanding with this d/c plan without any unaddressed concerns.  Antony Madura, PA-C 06/21/12 0111

## 2012-06-20 NOTE — ED Notes (Signed)
Pt c/o bilat thigh pain onset 0700 today. Pt sclera noted to be yellow, pt is aware,states this is normal. Pt denies SOB, pt denies chest pain. Pt placed on 2L 02 by [place]. Port accessed and labs drawn.

## 2012-06-20 NOTE — ED Notes (Signed)
Pt c/o rib; lower back and thigh pain; sickle cell pain since this morning; no better after meds

## 2012-06-20 NOTE — ED Notes (Signed)
Patient requesting port to be accessed for blood for labs.RN Tammy made aware

## 2012-06-21 ENCOUNTER — Telehealth (HOSPITAL_COMMUNITY): Payer: Self-pay | Admitting: General Practice

## 2012-06-21 MED ORDER — HEPARIN SOD (PORK) LOCK FLUSH 100 UNIT/ML IV SOLN
500.0000 [IU] | Freq: Once | INTRAVENOUS | Status: DC
  Filled 2012-06-21: qty 5

## 2012-06-21 MED ORDER — HYDROMORPHONE HCL PF 2 MG/ML IJ SOLN: 2.0000 mg | Freq: Once | INTRAMUSCULAR | Status: DC

## 2012-06-21 NOTE — ED Provider Notes (Signed)
Medical screening examination/treatment/procedure(s) were performed by non-physician practitioner and as supervising physician I was immediately available for consultation/collaboration.   Richardean Canal, MD 06/21/12 1105

## 2012-06-21 NOTE — Telephone Encounter (Signed)
Patient called unit for treatment will notify NP and will return call back to patient.

## 2012-06-21 NOTE — Telephone Encounter (Signed)
NP notified, she states will return call back to patient.

## 2012-06-21 NOTE — H&P (Signed)
Pt seen and examined and discussed with NP Shivaan Tierno [name]. Agree with assessment and plan. 

## 2012-06-21 NOTE — ED Notes (Signed)
Pt states he would like to try one more injection of pain medication before deciding to be admitted or discharged.

## 2012-06-22 ENCOUNTER — Encounter (HOSPITAL_COMMUNITY): Payer: Self-pay | Admitting: Hematology

## 2012-06-22 ENCOUNTER — Non-Acute Institutional Stay (HOSPITAL_COMMUNITY)
Admission: AD | Admit: 2012-06-22 | Discharge: 2012-06-22 | Disposition: A | Discharge status: Home / Self Care | Payer: Medicare Other | Attending: Internal Medicine | Admitting: Internal Medicine

## 2012-06-22 ENCOUNTER — Telehealth (HOSPITAL_COMMUNITY): Payer: Self-pay | Admitting: Hematology

## 2012-06-22 DIAGNOSIS — R52 Pain, unspecified: Secondary | ICD-10-CM | POA: Insufficient documentation

## 2012-06-22 DIAGNOSIS — Z7901 Long term (current) use of anticoagulants: Secondary | ICD-10-CM | POA: Diagnosis not present

## 2012-06-22 DIAGNOSIS — G8929 Other chronic pain: Secondary | ICD-10-CM | POA: Insufficient documentation

## 2012-06-22 DIAGNOSIS — D5700 Hb-SS disease with crisis, unspecified: Secondary | ICD-10-CM | POA: Insufficient documentation

## 2012-06-22 DIAGNOSIS — Z86711 Personal history of pulmonary embolism: Secondary | ICD-10-CM | POA: Insufficient documentation

## 2012-06-22 DIAGNOSIS — I10 Essential (primary) hypertension: Secondary | ICD-10-CM | POA: Diagnosis not present

## 2012-06-22 DIAGNOSIS — Z96649 Presence of unspecified artificial hip joint: Secondary | ICD-10-CM | POA: Diagnosis not present

## 2012-06-22 DIAGNOSIS — F39 Unspecified mood [affective] disorder: Secondary | ICD-10-CM | POA: Diagnosis not present

## 2012-06-22 DIAGNOSIS — Z79899 Other long term (current) drug therapy: Secondary | ICD-10-CM | POA: Diagnosis not present

## 2012-06-22 LAB — CBC WITH DIFFERENTIAL/PLATELET
Basophils Absolute: 0.2 10*3/uL — ABNORMAL HIGH (ref 0.0–0.1)
Eosinophils Absolute: 0 10*3/uL (ref 0.0–0.7)
HCT: 22.9 % — ABNORMAL LOW (ref 39.0–52.0)
Hemoglobin: 7.8 g/dL — ABNORMAL LOW (ref 13.0–17.0)
Lymphocytes Relative: 7 % — ABNORMAL LOW (ref 12–46)
MCHC: 34.1 g/dL (ref 30.0–36.0)
Monocytes Relative: 14 % — ABNORMAL HIGH (ref 3–12)
Neutrophils Relative %: 78 % — ABNORMAL HIGH (ref 43–77)
RDW: 22.5 % — ABNORMAL HIGH (ref 11.5–15.5)
WBC: 20.5 10*3/uL — ABNORMAL HIGH (ref 4.0–10.5)

## 2012-06-22 LAB — RETICULOCYTES: Retic Ct Pct: 19 % — ABNORMAL HIGH (ref 0.4–3.1)

## 2012-06-22 LAB — COMPREHENSIVE METABOLIC PANEL
ALT: 14 U/L (ref 0–53)
Alkaline Phosphatase: 84 U/L (ref 39–117)
BUN: 7 mg/dL (ref 6–23)
CO2: 22 mEq/L (ref 19–32)
Calcium: 9 mg/dL (ref 8.4–10.5)
GFR calc Af Amer: >90 mL/min (ref >90)
GFR calc non Af Amer: >90 mL/min (ref >90)
Glucose, Bld: 109 mg/dL — ABNORMAL HIGH (ref 70–99)
Sodium: 141 mEq/L (ref 135–145)
Total Protein: 7.4 g/dL (ref 6.0–8.3)

## 2012-06-22 MED ORDER — OLANZAPINE 10 MG PO TABS
10.0000 mg | ORAL_TABLET | Freq: Every day | ORAL | Status: DC
  Filled 2012-06-22: qty 1

## 2012-06-22 MED ORDER — SODIUM CHLORIDE 0.9 % IJ SOLN: 10.0000 mL | INTRAMUSCULAR | Status: DC | PRN

## 2012-06-22 MED ORDER — HYDROMORPHONE HCL PF 2 MG/ML IJ SOLN
4.0000 mg | Freq: Once | INTRAMUSCULAR | Status: AC
  Administered 2012-06-22: 4 mg via INTRAVENOUS
  Filled 2012-06-22: qty 2

## 2012-06-22 MED ORDER — FOLIC ACID 1 MG PO TABS
1.0000 mg | ORAL_TABLET | Freq: Every morning | ORAL | Status: DC
  Administered 2012-06-22: 1 mg via ORAL
  Filled 2012-06-22: qty 1

## 2012-06-22 MED ORDER — SENNOSIDES-DOCUSATE SODIUM 8.6-50 MG PO TABS
1.0000 | ORAL_TABLET | Freq: Two times a day (BID) | ORAL | Status: DC
  Administered 2012-06-22: 1 via ORAL
  Filled 2012-06-22: qty 1

## 2012-06-22 MED ORDER — HYDROXYUREA 500 MG PO CAPS
500.0000 mg | ORAL_CAPSULE | Freq: Two times a day (BID) | ORAL | Status: DC
  Administered 2012-06-22: 500 mg via ORAL
  Filled 2012-06-22: qty 1

## 2012-06-22 MED ORDER — HYDROMORPHONE HCL PF 2 MG/ML IJ SOLN
4.0000 mg | INTRAMUSCULAR | Status: AC
  Administered 2012-06-22 (×3): 4 mg via INTRAVENOUS
  Filled 2012-06-22 (×3): qty 2

## 2012-06-22 MED ORDER — POTASSIUM CHLORIDE CRYS ER 20 MEQ PO TBCR
20.0000 meq | EXTENDED_RELEASE_TABLET | Freq: Every morning | ORAL | Status: DC
  Administered 2012-06-22: 20 meq via ORAL
  Filled 2012-06-22 (×2): qty 1

## 2012-06-22 MED ORDER — POTASSIUM CHLORIDE CRYS ER 20 MEQ PO TBCR
20.0000 meq | EXTENDED_RELEASE_TABLET | Freq: Two times a day (BID) | ORAL | Status: DC
  Administered 2012-06-22: 20 meq via ORAL

## 2012-06-22 MED ORDER — KETOROLAC TROMETHAMINE 30 MG/ML IJ SOLN
30.0000 mg | Freq: Four times a day (QID) | INTRAMUSCULAR | Status: DC
  Administered 2012-06-22: 30 mg via INTRAVENOUS
  Filled 2012-06-22: qty 1

## 2012-06-22 MED ORDER — DIPHENHYDRAMINE HCL 25 MG PO CAPS: 25.0000 mg | ORAL_CAPSULE | ORAL | Status: DC | PRN

## 2012-06-22 MED ORDER — LISINOPRIL 10 MG PO TABS
10.0000 mg | ORAL_TABLET | Freq: Every day | ORAL | Status: DC
  Filled 2012-06-22: qty 1

## 2012-06-22 MED ORDER — DEXTROSE-NACL 5-0.45 % IV SOLN
INTRAVENOUS | Status: DC
  Administered 2012-06-22: 09:00:00 via INTRAVENOUS

## 2012-06-22 MED ORDER — MORPHINE SULFATE ER 15 MG PO TBCR
15.0000 mg | EXTENDED_RELEASE_TABLET | Freq: Two times a day (BID) | ORAL | Status: DC
  Administered 2012-06-22: 15 mg via ORAL
  Filled 2012-06-22: qty 1

## 2012-06-22 MED ORDER — ONDANSETRON HCL 4 MG/2ML IJ SOLN
4.0000 mg | INTRAMUSCULAR | Status: DC | PRN
  Administered 2012-06-22: 4 mg via INTRAVENOUS
  Filled 2012-06-22: qty 2

## 2012-06-22 MED ORDER — RIVAROXABAN 20 MG PO TABS
20.0000 mg | ORAL_TABLET | Freq: Every day | ORAL | Status: DC
  Administered 2012-06-22: 20 mg via ORAL
  Filled 2012-06-22 (×2): qty 1

## 2012-06-22 MED ORDER — HEPARIN SOD (PORK) LOCK FLUSH 100 UNIT/ML IV SOLN
500.0000 [IU] | INTRAVENOUS | Status: AC | PRN
  Administered 2012-06-22: 500 [IU]
  Filled 2012-06-22: qty 5

## 2012-06-22 MED ORDER — ONDANSETRON HCL 8 MG PO TABS: 4.0000 mg | ORAL_TABLET | ORAL | Status: DC | PRN

## 2012-06-22 NOTE — Discharge Summary (Signed)
[facility] Discharge Summary   Patient ID: [name] MRN: [id number] DOB/AGE: [birth year] 32 y.o.  Admit date: 06/22/2012 Discharge date: 06/22/2012  Primary Care Physician:  [name],[name], MD  Admission Diagnoses:  Active Problems:  VOC Acute on chronic pain  Chronic anticoagulants hx of PE  Mood disorder Hypertension   Discharge Diagnoses:   VOC Acute on chronic pain  Chronic anticoagulants hx of PE  Mood disorder Hypertension  Discharge Medications:    Medication List    ASK your doctor about these medications       diphenhydrAMINE 25 mg capsule  Commonly known as:  BENADRYL  Take 1-2 capsules (25-50 mg total) by mouth every 4 (four) hours as needed for itching.     folic acid 1 MG tablet  Commonly known as:  FOLVITE  Take 1 tablet (1 mg total) by mouth every morning.     HYDROmorphone 4 MG tablet  Commonly known as:  DILAUDID  Take 4 mg by mouth every 4 (four) hours as needed for pain.     hydroxyurea 500 MG capsule  Commonly known as:  HYDREA  Take 1 capsule (500 mg total) by mouth 2 (two) times daily. May take with food to minimize GI side effects.     ibuprofen 800 MG tablet  Commonly known as:  ADVIL,MOTRIN  Take 1 tablet (800 mg total) by mouth every 8 (eight) hours as needed for pain. Do not exceed use for more than 20/30 days     lisinopril 10 MG tablet  Commonly known as:  PRINIVIL,ZESTRIL  Take 1 tablet (10 mg total) by mouth daily at 10 pm.     morphine 15 MG 12 hr tablet  Commonly known as:  MS CONTIN  Take 1 tablet (15 mg total) by mouth 2 (two) times daily.     OLANZapine 10 MG tablet  Commonly known as:  ZYPREXA  Take 1 tablet (10 mg total) by mouth at bedtime.     potassium chloride SA 20 MEQ tablet  Commonly known as:  K-DUR,KLOR-CON  Take 20 mEq by mouth every morning.     Rivaroxaban 20 MG Tabs  Commonly known as:  XARELTO  Take 20 mg by mouth every morning.     zolpidem 10 MG tablet  Commonly  known as:  AMBIEN  Take 1 tablet (10 mg total) by mouth at bedtime as needed. For sleep       Consults:  None  Significant Diagnostic Studies:  Dg Chest 2 View  06/20/2012   *RADIOLOGY REPORT*  Clinical Data: Shortness of breath.  CHEST - 2 VIEW  Comparison: 05/29/2012.  Findings: The power port is stable.  The heart is borderline enlarged but unchanged.  The mediastinal and hilar contours are stable.  There are chronic bronchitic type interstitial lung changes but no infiltrates, edema or effusions.  The bony thorax is intact.  IMPRESSION:  Chronic bronchitic type interstitial lung changes but no acute overlying pulmonary process.   Original Report Authenticated By: Rudie Meyer, M.D.   Dg Chest 2 View  05/29/2012   *RADIOLOGY REPORT*  Clinical Data: Chest pain.  Shortness of breath.  History of sickle cell and previous pulmonary embolus.  CHEST - 2 VIEW  Comparison: 05/16/2012  Findings: Stable appearance of power port type central venous catheter.  Mild cardiac enlargement with normal pulmonary vascularity.  Peribronchial thickening and central interstitial changes suggesting chronic bronchitis and fibrosis.  No focal airspace consolidation or edema.  No blunting  of costophrenic angles.  No pneumothorax.  Mediastinal contours appear intact. Minimal endplate changes in the spine consistent with history of sickle cell.  Surgical clips in the right upper quadrant.  No significant change since previous study.  IMPRESSION: No evidence of active pulmonary disease.  Mild cardiac enlargement with chronic bronchitic changes and mild interstitial fibrosis.   Original Report Authenticated By: Burman Nieves, M.D.   [facility] Course:  For complete details please refer to admission H and P, but in brief, [facility] who has been having long acting medications weaned and been doing well. He states that he started having pain in the ribs starting yesterday. He spoke with the NP who  advised him to increase his rescue medications but still did not have good control of symptoms. [facility] symptoms.  Treatment course included:  VOC: He was treated with IVF for hydration,  bolus doses of Dilaudid Q 2hrs,antiemetics and antipruitics prn for associated symptoms with analgesics. Toradol for adjunct therapy for inflammatory /reactive process. SCD: Remain on maintenance medication hydrourea and folic acid  HTN: Pt's BP was under good control at this time of discharge. He is continued on Lisinopril. 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 He is was continued on Xarelto.  Hypokalemia: This is chronic he will receive an additional dose of Kdur given   Physical Exam at Discharge:  BP 126/74  Pulse 85  Temp(Src) 98.6 F (37 C) (Oral)  Resp 19  Ht 6' (1.829 m)  Wt 172 lb (78.019 kg)  BMI 23.32 kg/m2  SpO2 92%  ZOX:WRUEA, awake, oriented x3, in no acute distress Cardiovascular:Regular rate and rhythm, without murmurs, rubs, gallops Respiratory: Clear to auscultation, no wheezing or rhonchi No vocal fremitus  Gastrointestinal:Soft, nontender, nondistended, positive bowel sounds, no masses (BM yesterday) Extremities: No warm swelling or erythema around joints, no spinal tenderness   Disposition at Discharge: 01-Home or Self Care  Discharge Orders:      Future Appointments Provider Department Dept Phone   06/26/2012 1:00 PM Grayce Sessions, NP [facility] [phone]      Condition at Discharge:   Stable  Time spent on Discharge:  Greater than 30 minutes.  SignedRanda Evens, [name] 06/22/2012, 2:19 PM

## 2012-06-22 NOTE — Progress Notes (Signed)
Patient ID: [name], male   DOB: 1980/11/08, 32 y.o.   MRN: [id number] Pt. Flushed with heparin as per protocol, deaccessed, pt tolerated well. Site remain dry and intact. Patient states his pain is down to a 5/10 feeling much better.  Discharge papers discussed, papers signed, pt discharged at 1626 via walking by himself.

## 2012-06-22 NOTE — H&P (Signed)
Hospital Admission Note Date: 06/22/2012  Patient name: ARVAL BRANDSTETTER Medical record number: [id number] Date of birth: 1980/07/29 Age: 32 y.o. Gender: male PCP: [name],[name], MD  Attending physician: Altha Harm, MD  Chief Complaint:Pain in ribs and legs.  History of Present Illness: [facility] who has been having long acting medications weaned and been doing well. He states that he started having pain in the ribs starting yesterday. He spoke with the NP who advised him to increase his rescue medications but still did not have good control of symptoms. [facility] of symptoms. Pt describes pain as throbbing and non-radiating. He rates pain as 8/10 and localized to ribs and legs and identifies no palliative or provocative features. He had one episode of emesis last night but has been able to tolerate diet without any difficulty this morning. He denies fever, chills, diarrhea or nausea.    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  Allergies: Morphine and related Past Medical History  Diagnosis Date  . Sickle cell anemia   . Blood transfusion   . 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 Hypokalemia   . Mood disorder   . Pulmonary embolism   . Avascular necrosis   . Leukocytosis     Chronic  . Thrombocytosis     Chronic  . Hypertension    Past Surgical History  Procedure Laterality Date  . Right hip replacement      08/2006  . Cholecystectomy      01/2008  . Porta cath placement    . Porta cath removal    . Umbilical hernia repair      01/2008  . Excision of left periauricular cyst      10/2009  . Excision of right ear lobe cyst with primary closur      11/2007  . Portacath placement  01/05/2012    Procedure: INSERTION PORT-A-CATH;  Surgeon: Adolph Pollack, MD;  Location: [facility];  Service: General;   Laterality: N/A;  ultrasound guiced port a cath insertion with fluoroscopy   Family History  Problem Relation Age of Onset  . Sickle cell anemia Mother   . Sickle cell anemia Father   . Sickle cell trait Brother    History   Social History  . Marital Status: Single    Spouse Name: N/A    Number of Children: 0  . Years of Education: 13   Occupational History  . Unemployed    Social History Main Topics  . Smoking status: Former Smoker -- 13 years    Types: Cigarettes    Quit date: 07/08/2010  . Smokeless tobacco: Never Used  . Alcohol Use: No  . Drug Use: No     Comment: quit smoking 2011  . Sexually Active: Yes -- Male partner(s)    Birth Control/ Protection: None   Other Topics Concern  . Not on file   Social History Narrative   Lives in an apartment.  Single.  Lives with girlfriend.  Does not use any assist devices.        [name]:  [phone] Mom, emergency contact   Review of Systems: A comprehensive review of systems was negative except as noted in HPI. Physical Exam:  Intake/Output Summary (Last 24 hours) at 06/22/12 0840 Last data filed at 06/22/12 0800  Gross per 24 hour  Intake    240 ml  Output      0 ml  Net    240 ml   General: Alert, awake, oriented x3, in no acute distress.  HEENT: /AT PEERL, EOMI, anicteric Neck: Trachea midline,  no masses, no thyromegal,y no JVD, no carotid bruit OROPHARYNX:  Moist, No exudate/ erythema/lesions.  Heart: Regular rate and rhythm, without murmurs, rubs, gallops.  Lungs: Clear to auscultation, no wheezing or rhonchi noted. Abdomen: Soft, nontender, nondistended, positive bowel sounds, no masses no hepatosplenomegaly noted.  Neuro: No focal neurological deficits noted cranial nerves II through XII grossly intact. DTRs 2+ bilaterally upper and lower extremities. Strength normal in bilateral upper and lower extremities. Musculoskeletal: No warm swelling or erythema around joints, no spinal tenderness  noted. Psychiatric: Patient alert and oriented x3, good insight and cognition, good recent to remote recall.   Lab results:  Recent Labs  06/20/12 2030  NA 138  K 3.4*  CL 106  CO2 23  GLUCOSE 105*  BUN 5*  CREATININE 0.75  CALCIUM 8.9    Recent Labs  06/20/12 2030  AST 31  ALT 15  ALKPHOS 88  BILITOT 4.6*  PROT 7.5  ALBUMIN 3.9   No results found for this basename: LIPASE, AMYLASE,  in the last 72 hours  Recent Labs  06/20/12 2019  WBC 15.5*  NEUTROABS 9.3*  HGB 7.6*  HCT 23.1*  MCV 91.3  PLT 460*   No results found for this basename: CKTOTAL, CKMB, CKMBINDEX, TROPONINI,  in the last 72 hours No components found with this basename: POCBNP,  No results found for this basename: DDIMER,  in the last 72 hours No results found for this basename: HGBA1C,  in the last 72 hours No results found for this basename: CHOL, HDL, LDLCALC, TRIG, CHOLHDL, LDLDIRECT,  in the last 72 hours No results found for this basename: TSH, T4TOTAL, FREET3, T3FREE, THYROIDAB,  in the last 72 hours  Recent Labs  06/20/12 2019  RETICCTPCT 14.6*   Imaging results:  Dg Chest 2 View  06/20/2012   *RADIOLOGY REPORT*  Clinical Data: Shortness of breath.  CHEST - 2 VIEW  Comparison: 05/29/2012.  Findings: The power port is stable.  The heart is borderline enlarged but unchanged.  The mediastinal and hilar contours are stable.  There are chronic bronchitic type interstitial lung changes but no infiltrates, edema or effusions.  The bony thorax is intact.  IMPRESSION:  Chronic bronchitic type interstitial lung changes but no acute overlying pulmonary process.   Original Report Authenticated By: Rudie Meyer, M.D.   Dg Chest 2 View  05/29/2012   *RADIOLOGY REPORT*  Clinical Data: Chest pain.  Shortness of breath.  History of sickle cell and previous pulmonary embolus.  CHEST - 2 VIEW  Comparison: 05/16/2012  Findings: Stable appearance of power port type central venous catheter.  Mild cardiac  enlargement with normal pulmonary vascularity.  Peribronchial thickening and central interstitial changes suggesting chronic bronchitis and fibrosis.  No focal airspace consolidation or edema.  No blunting of costophrenic angles.  No pneumothorax.  Mediastinal contours appear intact. Minimal endplate changes in the spine consistent with history of sickle cell.  Surgical clips in the right upper quadrant.  No significant change since previous study.  IMPRESSION: No evidence of active pulmonary disease.  Mild cardiac enlargement with chronic bronchitic changes and mild interstitial fibrosis.   Original Report Authenticated By: Burman Nieves, M.D.     Assessment and Plan: 1.  Acute VOC: Pt in initial of acute VOC. Will treat aggressively with scheduled Dilaudid x 3 doses than re-assess. Also hydrate with hypotonic fluid and Toradol for adjunctive treatment. Also utilize heating pad and ICS to ensure no atelectaisis.  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 Will evaluate CBC with diff today. If indices allow will increase to 1000 mg.  3. History of PE: On Xarelto. Continue.    [name],[name] 06/22/2012, 8:40 AM

## 2012-06-22 NOTE — Telephone Encounter (Signed)
Pt called c/o pain to back and thighs 9/10 on the pain scale. Denies fever, chest pain, abdo pain, diarrhea or priapism.  Dr Ashley Royalty notified, he was called back and notified to come in.

## 2012-06-26 ENCOUNTER — Ambulatory Visit (INDEPENDENT_AMBULATORY_CARE_PROVIDER_SITE_OTHER): Payer: Medicare Other | Admitting: Primary Care

## 2012-06-26 ENCOUNTER — Encounter: Payer: Self-pay | Admitting: Primary Care

## 2012-06-26 ENCOUNTER — Inpatient Hospital Stay (HOSPITAL_COMMUNITY)
Admission: AD | Admit: 2012-06-26 | Discharge: 2012-06-29 | DRG: 812 | Disposition: A | Discharge status: Home / Self Care | Payer: Medicare Other | Source: Ambulatory Visit | Attending: Internal Medicine | Admitting: Internal Medicine

## 2012-06-26 ENCOUNTER — Encounter (HOSPITAL_COMMUNITY): Payer: Self-pay

## 2012-06-26 VITALS — BP 112/73 | HR 91 | Temp 98.2°F | Ht 72.0 in | Wt 176.0 lb

## 2012-06-26 DIAGNOSIS — G894 Chronic pain syndrome: Secondary | ICD-10-CM

## 2012-06-26 DIAGNOSIS — R319 Hematuria, unspecified: Secondary | ICD-10-CM | POA: Diagnosis not present

## 2012-06-26 DIAGNOSIS — F39 Unspecified mood [affective] disorder: Secondary | ICD-10-CM | POA: Diagnosis present

## 2012-06-26 DIAGNOSIS — D72829 Elevated white blood cell count, unspecified: Secondary | ICD-10-CM | POA: Diagnosis not present

## 2012-06-26 DIAGNOSIS — F0630 Mood disorder due to known physiological condition, unspecified: Secondary | ICD-10-CM | POA: Diagnosis not present

## 2012-06-26 DIAGNOSIS — M8700 Idiopathic aseptic necrosis of unspecified bone: Secondary | ICD-10-CM | POA: Diagnosis present

## 2012-06-26 DIAGNOSIS — D599 Acquired hemolytic anemia, unspecified: Secondary | ICD-10-CM

## 2012-06-26 DIAGNOSIS — D57819 Other sickle-cell disorders with crisis, unspecified: Secondary | ICD-10-CM

## 2012-06-26 DIAGNOSIS — K5900 Constipation, unspecified: Secondary | ICD-10-CM | POA: Diagnosis present

## 2012-06-26 DIAGNOSIS — D5700 Hb-SS disease with crisis, unspecified: Principal | ICD-10-CM | POA: Diagnosis present

## 2012-06-26 DIAGNOSIS — H6120 Impacted cerumen, unspecified ear: Secondary | ICD-10-CM

## 2012-06-26 DIAGNOSIS — Z7901 Long term (current) use of anticoagulants: Secondary | ICD-10-CM | POA: Diagnosis not present

## 2012-06-26 DIAGNOSIS — D571 Sickle-cell disease without crisis: Secondary | ICD-10-CM

## 2012-06-26 DIAGNOSIS — M545 Low back pain, unspecified: Secondary | ICD-10-CM | POA: Diagnosis not present

## 2012-06-26 DIAGNOSIS — E876 Hypokalemia: Secondary | ICD-10-CM | POA: Diagnosis present

## 2012-06-26 DIAGNOSIS — I2782 Chronic pulmonary embolism: Secondary | ICD-10-CM | POA: Diagnosis present

## 2012-06-26 DIAGNOSIS — I10 Essential (primary) hypertension: Secondary | ICD-10-CM

## 2012-06-26 LAB — CBC WITH DIFFERENTIAL/PLATELET
Basophils Relative: 1 % (ref 0–1)
Eosinophils Absolute: 0.5 10*3/uL (ref 0.0–0.7)
Eosinophils Relative: 3 % (ref 0–5)
Hemoglobin: 7.6 g/dL — ABNORMAL LOW (ref 13.0–17.0)
Lymphocytes Relative: 12 % (ref 12–46)
MCHC: 34.5 g/dL (ref 30.0–36.0)
Monocytes Relative: 15 % — ABNORMAL HIGH (ref 3–12)
Neutrophils Relative %: 69 % (ref 43–77)
RBC: 2.39 MIL/uL — ABNORMAL LOW (ref 4.22–5.81)
WBC: 16.5 10*3/uL — ABNORMAL HIGH (ref 4.0–10.5)

## 2012-06-26 LAB — RETICULOCYTES
Retic Count, Absolute: 365.7 10*3/uL — ABNORMAL HIGH (ref 19.0–186.0)
Retic Ct Pct: 15.3 % — ABNORMAL HIGH (ref 0.4–3.1)

## 2012-06-26 LAB — COMPREHENSIVE METABOLIC PANEL
BUN: 8 mg/dL (ref 6–23)
CO2: 23 mEq/L (ref 19–32)
Calcium: 8.9 mg/dL (ref 8.4–10.5)
Creatinine, Ser: 0.53 mg/dL (ref 0.50–1.35)
GFR calc Af Amer: >90 mL/min (ref >90)
GFR calc non Af Amer: >90 mL/min (ref >90)
Glucose, Bld: 97 mg/dL (ref 70–99)
Total Protein: 7.8 g/dL (ref 6.0–8.3)

## 2012-06-26 MED ORDER — MORPHINE SULFATE ER 15 MG PO TBCR
15.0000 mg | EXTENDED_RELEASE_TABLET | Freq: Two times a day (BID) | ORAL | Status: DC
  Administered 2012-06-26 – 2012-06-29 (×6): 15 mg via ORAL
  Filled 2012-06-26 (×6): qty 1

## 2012-06-26 MED ORDER — RIVAROXABAN 20 MG PO TABS
20.0000 mg | ORAL_TABLET | Freq: Every morning | ORAL | Status: DC
  Administered 2012-06-27 – 2012-06-29 (×3): 20 mg via ORAL
  Filled 2012-06-26 (×3): qty 1

## 2012-06-26 MED ORDER — HYDROMORPHONE HCL PF 2 MG/ML IJ SOLN
2.0000 mg | INTRAMUSCULAR | Status: DC | PRN
  Administered 2012-06-26: 2 mg via INTRAVENOUS
  Administered 2012-06-27 (×2): 4 mg via INTRAVENOUS
  Administered 2012-06-27: 2 mg via INTRAVENOUS
  Administered 2012-06-27 (×3): 4 mg via INTRAVENOUS
  Filled 2012-06-26: qty 1
  Filled 2012-06-26 (×8): qty 2

## 2012-06-26 MED ORDER — FOLIC ACID 1 MG PO TABS
1.0000 mg | ORAL_TABLET | Freq: Every morning | ORAL | Status: DC
  Administered 2012-06-27 – 2012-06-29 (×3): 1 mg via ORAL
  Filled 2012-06-26 (×3): qty 1

## 2012-06-26 MED ORDER — ZOLPIDEM TARTRATE 5 MG PO TABS
5.0000 mg | ORAL_TABLET | Freq: Every evening | ORAL | Status: DC | PRN
  Administered 2012-06-26 – 2012-06-28 (×3): 5 mg via ORAL
  Filled 2012-06-26 (×3): qty 1

## 2012-06-26 MED ORDER — KETOROLAC TROMETHAMINE 30 MG/ML IJ SOLN
30.0000 mg | Freq: Four times a day (QID) | INTRAMUSCULAR | Status: DC
  Administered 2012-06-26 – 2012-06-29 (×12): 30 mg via INTRAVENOUS
  Filled 2012-06-26 (×16): qty 1

## 2012-06-26 MED ORDER — DEXTROSE-NACL 5-0.45 % IV SOLN
INTRAVENOUS | Status: DC
  Administered 2012-06-26 – 2012-06-27 (×3): via INTRAVENOUS

## 2012-06-26 MED ORDER — ONDANSETRON HCL 4 MG/2ML IJ SOLN: 4.0000 mg | INTRAMUSCULAR | Status: DC | PRN

## 2012-06-26 MED ORDER — ONDANSETRON HCL 4 MG PO TABS: 4.0000 mg | ORAL_TABLET | ORAL | Status: DC | PRN

## 2012-06-26 MED ORDER — POTASSIUM CHLORIDE CRYS ER 20 MEQ PO TBCR
20.0000 meq | EXTENDED_RELEASE_TABLET | Freq: Every morning | ORAL | Status: DC
  Administered 2012-06-27 – 2012-06-29 (×3): 20 meq via ORAL
  Filled 2012-06-26 (×3): qty 1

## 2012-06-26 MED ORDER — HYDROXYUREA 500 MG PO CAPS
500.0000 mg | ORAL_CAPSULE | Freq: Two times a day (BID) | ORAL | Status: DC
  Administered 2012-06-26 – 2012-06-29 (×6): 500 mg via ORAL
  Filled 2012-06-26 (×9): qty 1

## 2012-06-26 MED ORDER — SENNOSIDES-DOCUSATE SODIUM 8.6-50 MG PO TABS
1.0000 | ORAL_TABLET | Freq: Two times a day (BID) | ORAL | Status: DC
  Administered 2012-06-26 – 2012-06-29 (×6): 1 via ORAL
  Filled 2012-06-26 (×7): qty 1

## 2012-06-26 MED ORDER — DIPHENHYDRAMINE HCL 50 MG/ML IJ SOLN: 12.5000 mg | INTRAMUSCULAR | Status: DC | PRN

## 2012-06-26 MED ORDER — OLANZAPINE 10 MG PO TABS
10.0000 mg | ORAL_TABLET | Freq: Every day | ORAL | Status: DC
  Administered 2012-06-26 – 2012-06-28 (×3): 10 mg via ORAL
  Filled 2012-06-26 (×5): qty 1

## 2012-06-26 MED ORDER — DIPHENHYDRAMINE HCL 25 MG PO CAPS
25.0000 mg | ORAL_CAPSULE | ORAL | Status: DC | PRN
  Administered 2012-06-26: 25 mg via ORAL
  Filled 2012-06-26: qty 1

## 2012-06-26 MED ORDER — LISINOPRIL 10 MG PO TABS
10.0000 mg | ORAL_TABLET | Freq: Every day | ORAL | Status: DC
  Administered 2012-06-27 – 2012-06-28 (×2): 10 mg via ORAL
  Filled 2012-06-26 (×4): qty 1

## 2012-06-26 MED ORDER — HYDROMORPHONE HCL PF 2 MG/ML IJ SOLN
4.0000 mg | INTRAMUSCULAR | Status: AC
  Administered 2012-06-26 (×3): 4 mg via INTRAVENOUS
  Filled 2012-06-26: qty 2

## 2012-06-26 MED ORDER — DEXTROSE 5 % IV SOLN
2000.0000 mg | Freq: Every day | INTRAVENOUS | Status: DC
  Administered 2012-06-26 – 2012-06-28 (×3): 2000 mg via INTRAVENOUS
  Filled 2012-06-26 (×6): qty 2

## 2012-06-26 NOTE — Progress Notes (Signed)
Subjective:     Patient ID: [name], male   DOB: February 01, 1981, 32 y.o.   MRN: [id number]  HPI [name] was seen today as a follow up. During this visit he c/o still having unbearable pain since last Friday and stayed home and was not able to go out of town as planned. He describes his pain as sharpe achy intermittent starting in mid back migrating to ribs. Rates his pain 7/10. He is not able to identify any palliative or provacative factors contributing to his pain crisis.   Review of Systems Review of Systems - Negative except muscle skeltal mid back m igrating to ribs      Objective:   Physical Exam BP 112/73  Pulse 91  Temp(Src) 98.2 F (36.8 C) (Oral)  Ht 6' (1.829 m)  Wt 176 lb (79.833 kg)  BMI 23.86 kg/m2  SpO2 98% General: Alert, awake, oriented x3, in moderate distress.  HEENT: [place]/AT PEERL, EOMI, mild sclera icteric , bilateral cerumen  Neck: Trachea midline, no masses, no thyromegal,no JVD, no carotid bruit  OROPHARYNX: Moist, No exudate/ erythema/lesions.  Heart: Regular rate and rhythm, without murmurs, rubs, gallops.  Lungs: Clear to auscultation, no wheezing or rhonchi noted.  Abdomen: Soft, nontender, nondistended, positive bowel sounds, no masses no hepatosplenomegaly noted.  Neuro: No focal neurological deficits Musculoskeletal: No warm swelling or erythema around joints, no spinal tenderness noted.  Psychiatric: Patient alert and oriented x3,      Assessment:     VOC- will admit to 23 hr [facility] for further treatment and evaluation.  SCD: continue health maintained medication folic acid, hydro urea,  Acute on chronic pain- treat on [facility]  Chronic anti coagulation therapy- on Xarelto this has increased compliance. He has a  hx of PE Mood dis order: mood appropriate continue Zyprexa  HTN: stable on lisinopril  Hypokalemia (hx of) continue K-dur re-evalute after labs reviewed   Hemochromatosis: will speak with CM for f/u with kelation therapy at  home with Exjade.  Impacted ears: bilateral cerumen . OTC debrox follow directions        [name], NP-C

## 2012-06-26 NOTE — Care Management (Signed)
Patient: [name] DOB :Jul 07, 1980 MRN :[id number]  Date: 06/26/2012  Documentation Initiated by : Jefm Miles  Subjective/Objective Assessment: [name] is a 32 year old male with Hb SS/SCD who came in for his scheduled doctor's office appointment and had pain which he was admitted for 23 hour observation at [facility].   Barriers to Care: Unable to afford Exjade  Prior Approval (PA) #: None required per Mellody Dance at [employer] [phone] PA start date: NA PA end date: NA   Action/Plan: This CM is awaiting Westley Hummer, NP to complete Exjade script, then CM will fax request for possible delivery to patient's home. This CM will continue to follow  Comments: This CM spoke with Mellody Dance with [facility] [phone]. Per Mellody Dance [name]'s plan does not require a prior approval for Exjade.  Time spent on case management: 30 mins.  Karoline Caldwell, RN, BSN, [place] [phone]

## 2012-06-26 NOTE — H&P (Signed)
[facility] History and Physical   Date: 06/26/2012  Patient name: [name] Medical record number: [id number] Date of birth: January 19, 1981 Age: 32 y.o. Gender: male PCP: [name],Gerald Sherrow A., MD  Attending physician: Altha Harm, MD  Chief Complaint:mid low back pain migrates to bilateral ribs   History of Present Illness:  [name]  is a 32 year old African American Male with SS genotype SCD. PMH : hypokalemia, AVN shoulder, hypertension and mood disorder.  He was seen today for an schedule appt. He presentesd in moderate distressed. When I entered the room his eyes where watering as he is clenching his sides. He tired to manage his pain at home since Friday and felt if he could hold out until his appt he would be ok. After evaluation he would benefit from treatment in the [facility] for the management of his acute symptoms. and was  He has bilateral abdominal pain no provative or palliative factors associated with pain. The pain is consistent aching sharpe and stabbing in nature. (710)      Meds: Prescriptions prior to admission  Medication Sig Dispense Refill  . diphenhydrAMINE (BENADRYL) 25 mg capsule Take 1-2 capsules (25-50 mg total) by mouth every 4 (four) hours as needed for itching.  30 capsule  0  . folic acid (FOLVITE) 1 MG tablet Take 1 tablet (1 mg total) by mouth every morning.  30 tablet  11  . HYDROmorphone (DILAUDID) 4 MG tablet Take 4 mg by mouth every 4 (four) hours as needed for pain.      . hydroxyurea (HYDREA) 500 MG capsule Take 1 capsule (500 mg total) by mouth 2 (two) times daily. May take with food to minimize GI side effects.  60 capsule  2  . ibuprofen (ADVIL,MOTRIN) 800 MG tablet Take 1 tablet (800 mg total) by mouth every 8 (eight) hours as needed for pain. Do not exceed use for more than 20/30 days  30 tablet  0  . lisinopril (PRINIVIL,ZESTRIL) 10 MG tablet Take 1 tablet (10 mg total) by mouth daily at 10 pm.  30 tablet  2  . morphine  (MS CONTIN) 15 MG 12 hr tablet Take 1 tablet (15 mg total) by mouth 2 (two) times daily.  60 tablet  0  . OLANZapine (ZYPREXA) 10 MG tablet Take 1 tablet (10 mg total) by mouth at bedtime.  30 tablet  3  . potassium chloride SA (K-DUR,KLOR-CON) 20 MEQ tablet Take 20 mEq by mouth every morning.       . Rivaroxaban (XARELTO) 20 MG TABS Take 20 mg by mouth every morning.      . zolpidem (AMBIEN) 10 MG tablet Take 1 tablet (10 mg total) by mouth at bedtime as needed. For sleep  30 tablet  0    Allergies: Morphine and related Past Medical History  Diagnosis Date  . Sickle cell anemia   . Blood transfusion   . 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 Hypokalemia   . Mood disorder   . Pulmonary embolism   . Avascular necrosis   . Leukocytosis     Chronic  . Thrombocytosis     Chronic  . Hypertension    Past Surgical History  Procedure Laterality Date  . Right hip replacement      08/2006  . Cholecystectomy      01/2008  . Porta cath placement    . Porta cath removal    . Umbilical hernia repair  01/2008  . Excision of left periauricular cyst      10/2009  . Excision of right ear lobe cyst with primary closur      11/2007  . Portacath placement  01/05/2012    Procedure: INSERTION PORT-A-CATH;  Surgeon: Adolph Pollack, MD;  Location: [facility] OR;  Service: General;  Laterality: N/A;  ultrasound guiced port a cath insertion with fluoroscopy   Family History  Problem Relation Age of Onset  . Sickle cell anemia Mother   . Sickle cell anemia Father   . Sickle cell trait Brother    History   Social History  . Marital Status: Single    Spouse Name: N/A    Number of Children: 0  . Years of Education: 13   Occupational History  . Unemployed    Social History Main Topics  . Smoking status: Former Smoker -- 13 years    Types: Cigarettes    Quit date: 07/08/2010  . Smokeless tobacco: Never Used  . Alcohol Use: No  . Drug Use: No     Comment: quit  smoking 2011  . Sexually Active: Yes -- Male partner(s)    Birth Control/ Protection: None   Other Topics Concern  . Not on file   Social History Narrative   Lives in an apartment.  Single.  Lives with girlfriend.  Does not use any assist devices.        [name]:  [phone] Mom, emergency contact    Review of Systems: A comprehensive review of systems was negative except for: Musculoskeletal: positive for mid back pain which migrates to bilateral ribs  Physical Exam: Blood pressure 123/78, pulse 83, temperature 98.5 F (36.9 C), temperature source Oral, resp. rate 16, SpO2 98.00%. General: Alert, awake, oriented x3, in moderate distress.  HEENT: [place]/AT PEERL, EOMI, mild sclera icteric , bilateral cerumen  Neck: Trachea midline, no masses, no thyromegal,no JVD, no carotid bruit  OROPHARYNX: Moist, No exudate/ erythema/lesions.  Heart: Regular rate and rhythm, without murmurs, rubs, gallops. Marland Kitchen LEFT CHEST PORT  Lungs: Clear to auscultation, no wheezing or rhonchi noted.  Abdomen: Soft, nontender, nondistended, positive bowel sounds, no masses no hepatosplenomegaly noted.  Neuro: No focal neurological deficits  Musculoskeletal: No warm swelling or erythema around joints, no spinal tenderness noted.  Psychiatric: Patient alert and oriented x3,     Lab-ORDERED Imaging results:  No results found.   Assessment & Plan:  VOC- will tx aggressively with hydration, scheduled analgesic , than prn. Antiemetics and antipruitics for associated s/s of medication and dz. Cont home maintance hydrourea , folic acid and long acting pain medication.  Acute on chronic pain-schedule and prn analgesic with adjunct therapy Toradol for inflammatory reactive process.  Chronic anti coagulation therapy- on Xarelto this has increased compliance. He has a hx of PE  Mood dis order: mood appropriate continue Zyprexa  HTN: stable on lisinopril  Hypokalemia (hx of) continue K-dur re-evalute after  labs reviewed  Hemochromatosis: he will receive Desferal tonight   [name] P 06/26/2012, 1:56 PM

## 2012-06-27 LAB — PROTIME-INR
INR: 1.61 — ABNORMAL HIGH (ref 0.00–1.49)
Prothrombin Time: 18.6 seconds — ABNORMAL HIGH (ref 11.6–15.2)

## 2012-06-27 IMAGING — CR DG CHEST 2V
2 series · 2 of 2 positions shown · non-contrast
Comparison: 07/06/2009

CLINICAL DATA: Sickle cell crisis.

CHEST - 2 VIEW

[w chest pa]
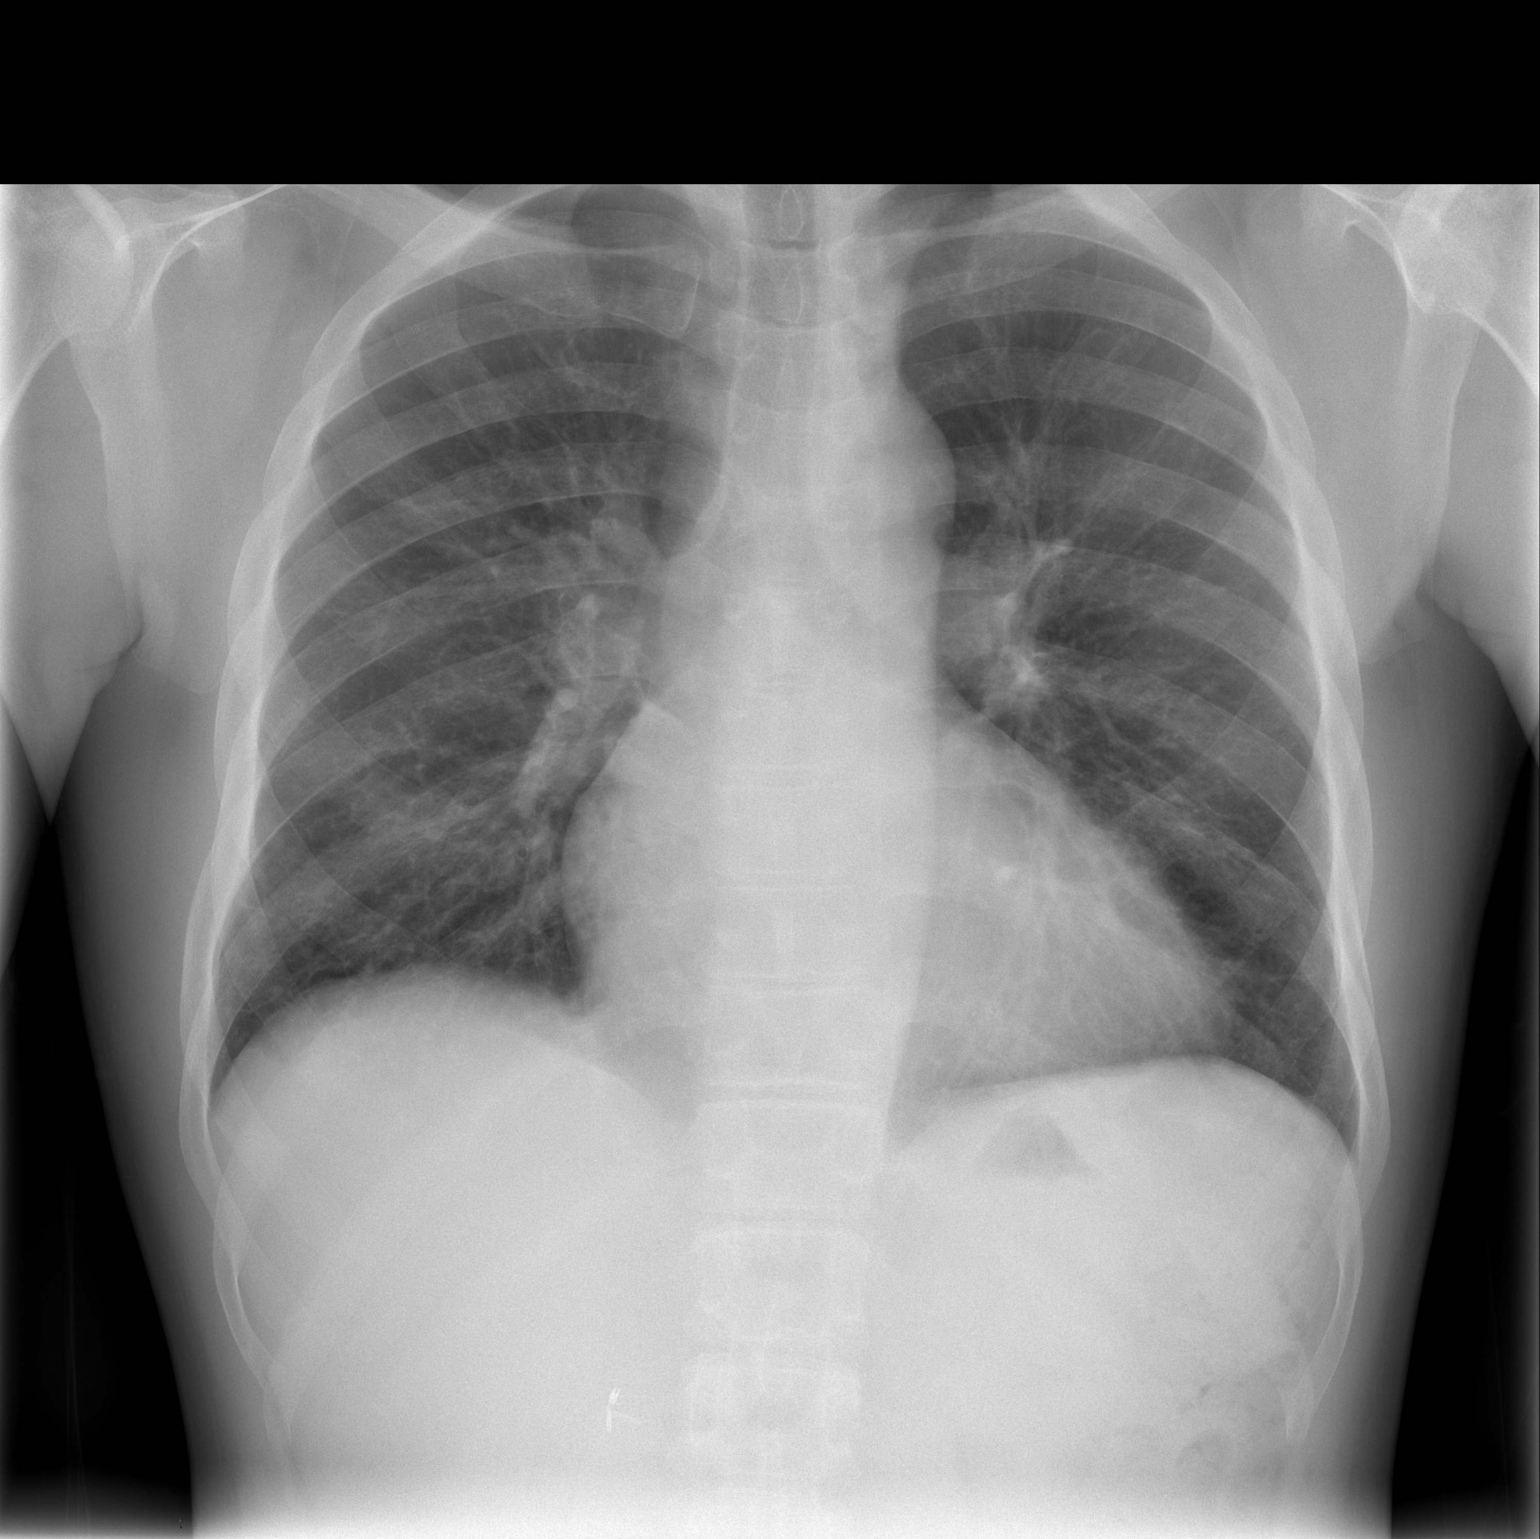

[w chest lat]
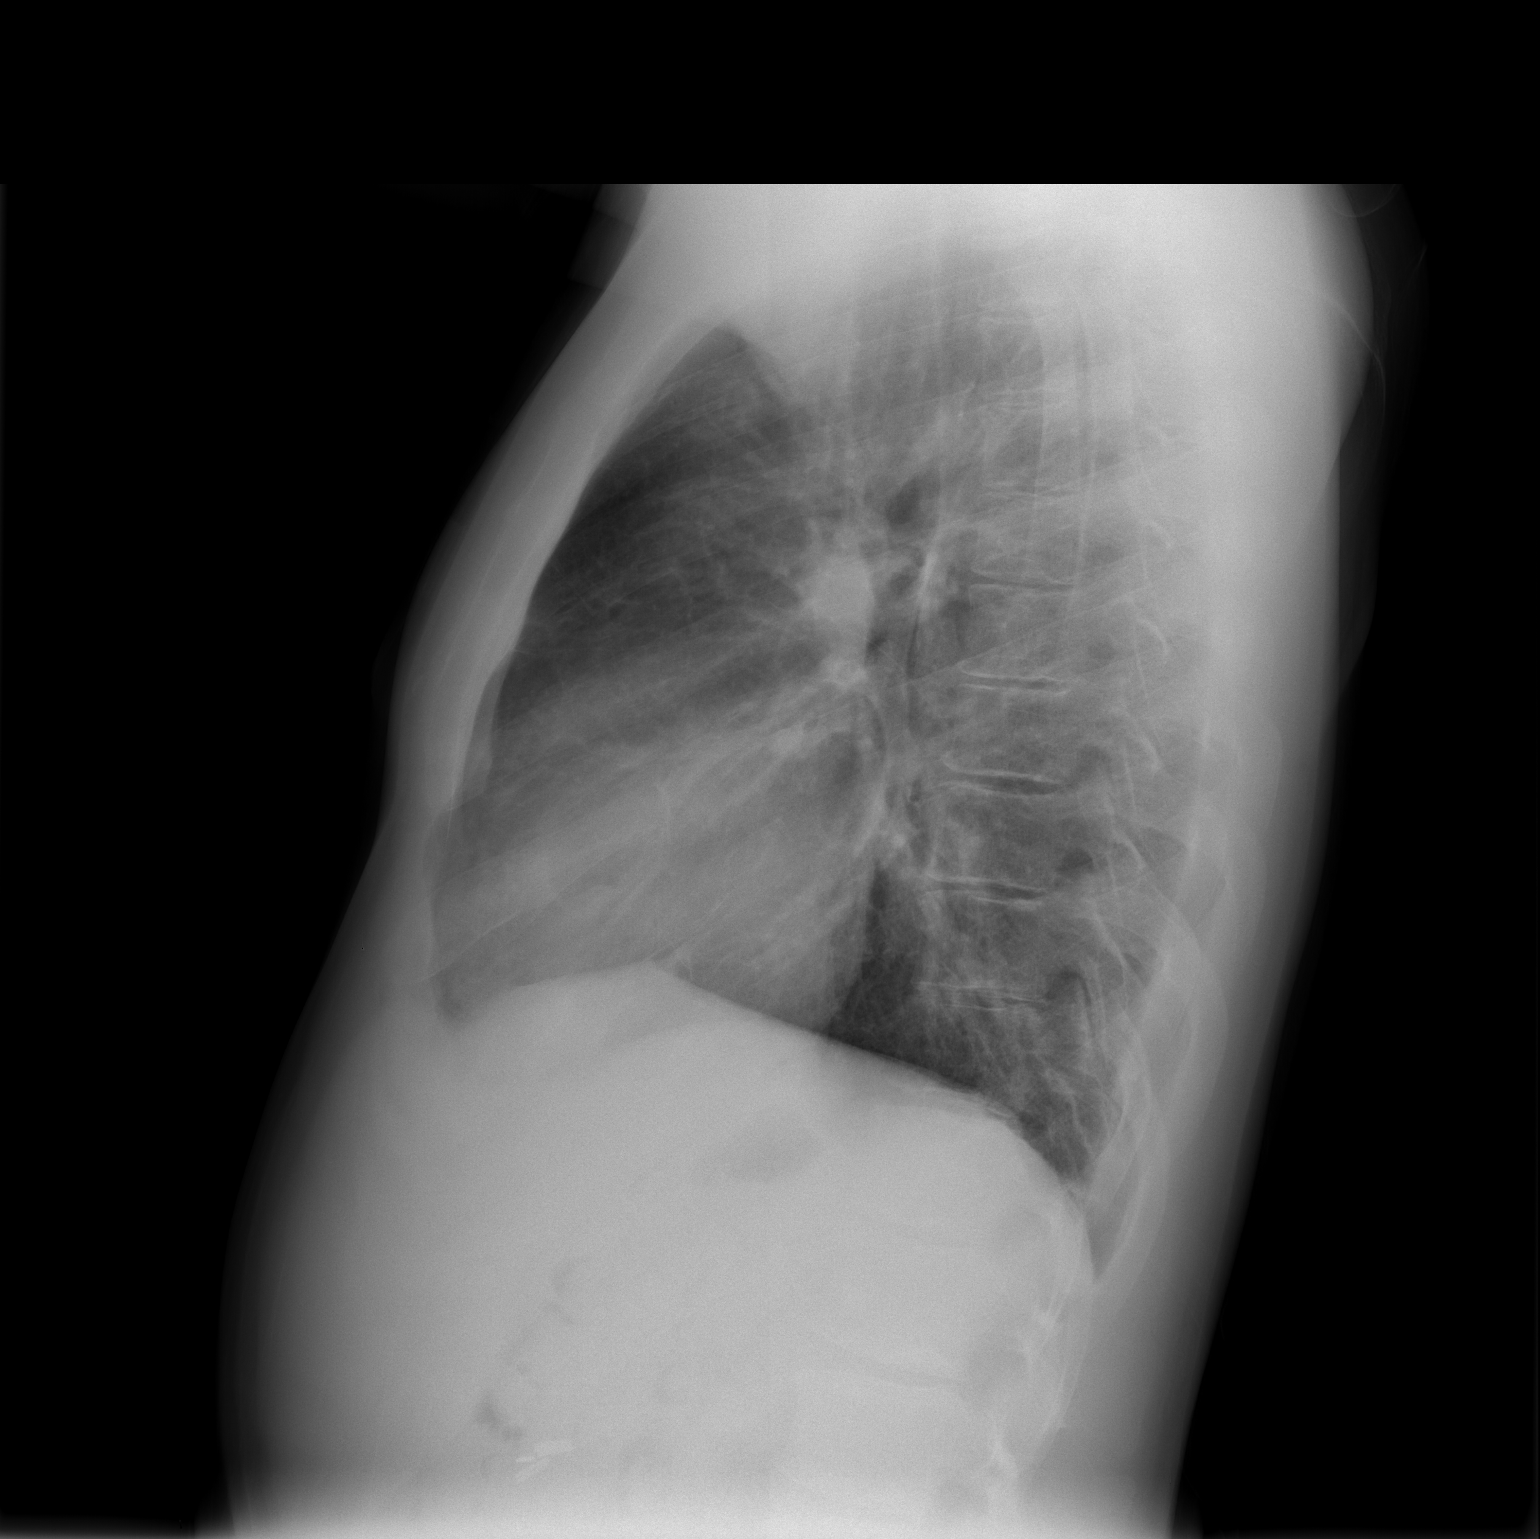

[2 of 2 positions shown; findings below may reference images not displayed]

FINDINGS: The cardiac silhouette, mediastinal and hilar contours
are within normal limits and stable.  The lungs are clear of acute
process.  No pleural effusions.  The bony thorax is intact.
IMPRESSION: No acute cardiopulmonary findings.  Stable appearance of the chest
since 07/06/2009.

## 2012-06-27 MED ORDER — NALOXONE HCL 0.4 MG/ML IJ SOLN: 0.4000 mg | INTRAMUSCULAR | Status: DC | PRN

## 2012-06-27 MED ORDER — SODIUM CHLORIDE 0.9 % IJ SOLN: 9.0000 mL | INTRAMUSCULAR | Status: DC | PRN

## 2012-06-27 MED ORDER — DEXTROSE-NACL 5-0.45 % IV SOLN
INTRAVENOUS | Status: DC
  Administered 2012-06-27 – 2012-06-28 (×2): via INTRAVENOUS

## 2012-06-27 MED ORDER — HYDROMORPHONE 0.3 MG/ML IV SOLN
INTRAVENOUS | Status: DC
  Administered 2012-06-27: 2.16 mg via INTRAVENOUS
  Administered 2012-06-27 – 2012-06-28 (×3): via INTRAVENOUS
  Filled 2012-06-27 (×3): qty 25

## 2012-06-27 NOTE — Progress Notes (Signed)
Patient ID: [name], male   DOB: 19-Dec-1980, 32 y.o.   MRN: [id number] Report called to Tim, on 5E, patient in stable condition, transported to floor via wheelchair

## 2012-06-27 NOTE — H&P (Signed)
[facility]  History and Physical   Date: 06/27/2012  Patient name: [name] Medical record number: [id number] Date of birth: 06/23/1980 Age: 32 y.o. Gender: male PCP: [name],[name], MD  Attending physician: Altha Harm, MD  Chief Complaint: low back pain which migrates to bilateral rib cage  History of Present Illness: Mr.Papillion is a 32 year old African American Male with SS genotype SCD. PMH : hypokalemia, AVN shoulder, hypertension and mood disorder. He was initially treated in the [facility] 23 hr observation for VOC. Treatment included  IV hydration , analgesic, antiemetic and antipruitics medication. Note home medications where resumed.  He is still rating his pain as 8/10 with little relief. The pain is described as constant throbbing aching. Palliative factors are clenching his ribs but there are no provacative factors. Discussed with Dr. Ashley Royalty tx he will be directly admitted and VOC aggressively treated with PCA and followed. Patient reports his pain has been escalating since last week Saturday.  Meds: Prescriptions prior to admission  Medication Sig Dispense Refill  . diphenhydrAMINE (BENADRYL) 25 mg capsule Take 1-2 capsules (25-50 mg total) by mouth every 4 (four) hours as needed for itching.  30 capsule  0  . folic acid (FOLVITE) 1 MG tablet Take 1 tablet (1 mg total) by mouth every morning.  30 tablet  11  . HYDROmorphone (DILAUDID) 4 MG tablet Take 4 mg by mouth every 4 (four) hours as needed for pain.      . hydroxyurea (HYDREA) 500 MG capsule Take 1 capsule (500 mg total) by mouth 2 (two) times daily. May take with food to minimize GI side effects.  60 capsule  2  . ibuprofen (ADVIL,MOTRIN) 800 MG tablet Take 1 tablet (800 mg total) by mouth every 8 (eight) hours as needed for pain. Do not exceed use for more than 20/30 days  30 tablet  0  . lisinopril (PRINIVIL,ZESTRIL) 10 MG tablet Take 1 tablet (10 mg total) by mouth daily at 10 pm.  30 tablet   2  . morphine (MS CONTIN) 15 MG 12 hr tablet Take 1 tablet (15 mg total) by mouth 2 (two) times daily.  60 tablet  0  . OLANZapine (ZYPREXA) 10 MG tablet Take 1 tablet (10 mg total) by mouth at bedtime.  30 tablet  3  . potassium chloride SA (K-DUR,KLOR-CON) 20 MEQ tablet Take 20 mEq by mouth every morning.       . Rivaroxaban (XARELTO) 20 MG TABS Take 20 mg by mouth every morning.      . zolpidem (AMBIEN) 10 MG tablet Take 1 tablet (10 mg total) by mouth at bedtime as needed. For sleep  30 tablet  0    Allergies: Morphine and related Past Medical History  Diagnosis Date  . Sickle cell anemia   . Blood transfusion   . 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 Hypokalemia   . Mood disorder   . Pulmonary embolism   . Avascular necrosis   . Leukocytosis     Chronic  . Thrombocytosis     Chronic  . Hypertension    Past Surgical History  Procedure Laterality Date  . Right hip replacement      08/2006  . Cholecystectomy      01/2008  . Porta cath placement    . Porta cath removal    . Umbilical hernia repair      01/2008  . Excision of left periauricular cyst  10/2009  . Excision of right ear lobe cyst with primary closur      11/2007  . Portacath placement  01/05/2012    Procedure: INSERTION PORT-A-CATH;  Surgeon: Adolph Pollack, MD;  Location: [facility];  Service: General;  Laterality: N/A;  ultrasound guiced port a cath insertion with fluoroscopy   Family History  Problem Relation Age of Onset  . Sickle cell anemia Mother   . Sickle cell anemia Father   . Sickle cell trait Brother    History   Social History  . Marital Status: Single    Spouse Name: N/A    Number of Children: 0  . Years of Education: 13   Occupational History  . Unemployed    Social History Main Topics  . Smoking status: Former Smoker -- 13 years    Types: Cigarettes    Quit date: 07/08/2010  . Smokeless tobacco: Never Used  . Alcohol Use: No  . Drug Use: No      Comment: quit smoking 2011  . Sexually Active: Yes -- Male partner(s)    Birth Control/ Protection: None   Other Topics Concern  . Not on file   Social History Narrative   Lives in an apartment.  Single.  Lives with girlfriend.  Does not use any assist devices.        [name]:  [phone] Mom, emergency contact    Review of Systems: A comprehensive review of systems was negative except for: Musculoskeletal: positive for mid back pain which migrates to bilateral ribs   Physical Exam: Blood pressure 111/81, pulse 70, temperature 98.1 F (36.7 C), temperature source Oral, resp. rate 16, SpO2 99.00%. General: Alert, awake, oriented x3, in moderate distress.  HEENT: [place]/AT PEERL, EOMI, mild sclera icteric , bilateral cerumen  Neck: Trachea midline, no masses, no thyromegal,no JVD, no carotid bruit  OROPHARYNX: Moist, No exudate/ erythema/lesions.  Heart: Regular rate and rhythm, without murmurs, rubs, gallops. Marland Kitchen LEFT CHEST PORT  Lungs: Clear to auscultation, no wheezing or rhonchi noted.  Abdomen: Soft, nontender, nondistended, positive bowel sounds, no masses no hepatosplenomegaly noted.  Neuro: No focal neurological deficits  Musculoskeletal: No warm swelling or erythema around joints, no spinal tenderness noted.  Psychiatric: Patient alert and oriented x3,   Lab results: Results for orders placed during the hospital encounter of 06/26/12 (from the past 24 hour(s))  COMPREHENSIVE METABOLIC PANEL     Status: Abnormal   Collection Time    06/26/12  2:00 PM      Result Value Range   Sodium 140  135 - 145 mEq/L   Potassium 3.4 (*) 3.5 - 5.1 mEq/L   Chloride 107  96 - 112 mEq/L   CO2 23  19 - 32 mEq/L   Glucose, Bld 97  70 - 99 mg/dL   BUN 8  6 - 23 mg/dL   Creatinine, Ser 0.98  0.50 - 1.35 mg/dL   Calcium 8.9  8.4 - 11.9 mg/dL   Total Protein 7.8  6.0 - 8.3 g/dL   Albumin 4.2  3.5 - 5.2 g/dL   AST 24  0 - 37 U/L   ALT 12  0 - 53 U/L   Alkaline Phosphatase 77  39 -  117 U/L   Total Bilirubin 5.9 (*) 0.3 - 1.2 mg/dL   GFR calc non Af Amer >90  >90 mL/min   GFR calc Af Amer >90  >90 mL/min  CBC WITH DIFFERENTIAL     Status: Abnormal  Collection Time    06/26/12  2:00 PM      Result Value Range   WBC 16.5 (*) 4.0 - 10.5 K/uL   RBC 2.39 (*) 4.22 - 5.81 MIL/uL   Hemoglobin 7.6 (*) 13.0 - 17.0 g/dL   HCT 16.1 (*) 09.6 - 04.5 %   MCV 92.1  78.0 - 100.0 fL   MCH 31.8  26.0 - 34.0 pg   MCHC 34.5  30.0 - 36.0 g/dL   RDW 40.9 (*) 81.1 - 91.4 %   Platelets 517 (*) 150 - 400 K/uL   Neutrophils Relative % 69  43 - 77 %   Lymphocytes Relative 12  12 - 46 %   Monocytes Relative 15 (*) 3 - 12 %   Eosinophils Relative 3  0 - 5 %   Basophils Relative 1  0 - 1 %   Neutro Abs 11.3 (*) 1.7 - 7.7 K/uL   Lymphs Abs 2.0  0.7 - 4.0 K/uL   Monocytes Absolute 2.5 (*) 0.1 - 1.0 K/uL   Eosinophils Absolute 0.5  0.0 - 0.7 K/uL   Basophils Absolute 0.2 (*) 0.0 - 0.1 K/uL   RBC Morphology POLYCHROMASIA PRESENT     WBC Morphology ATYPICAL LYMPHOCYTES     Smear Review LARGE PLATELETS PRESENT    RETICULOCYTES     Status: Abnormal   Collection Time    06/26/12  2:00 PM      Result Value Range   Retic Ct Pct 15.3 (*) 0.4 - 3.1 %   RBC. 2.39 (*) 4.22 - 5.81 MIL/uL   Retic Count, Manual 365.7 (*) 19.0 - 186.0 K/uL    Imaging results:  No results found.   Assessment & Plan: Vaso occlusive crisis/ Pain : Chronic likely caused by hyperviscosity, sticky blood vessel and decreased oxygen carrying capacity. Treatment /plan is hydromorphone PCA. Adjunction added Toradol 30mg  Q 6hrs for inflammatory/reactive process. Resume home long acting Ms contin 15 mg bid.  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 Elevated t.bilirubin and retic count. RPI - 3.05. Hgb on admission stable follow  Leukocytosis: This is secondary to bone marrow turn over and inflammatory process component. Toradol 30mg  Q 6 hrs for this inflammatory process.  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  Chronic anticoagulation: Cont Xarelto  [name], [name] 06/27/2012, 12:03 PM

## 2012-06-27 NOTE — Progress Notes (Signed)
Unit CM UR Completed by [facility] ED CM  W. Kavion Mancinas RN  

## 2012-06-28 DIAGNOSIS — D72829 Elevated white blood cell count, unspecified: Secondary | ICD-10-CM | POA: Diagnosis not present

## 2012-06-28 DIAGNOSIS — Z7901 Long term (current) use of anticoagulants: Secondary | ICD-10-CM

## 2012-06-28 DIAGNOSIS — I10 Essential (primary) hypertension: Secondary | ICD-10-CM

## 2012-06-28 DIAGNOSIS — D5700 Hb-SS disease with crisis, unspecified: Principal | ICD-10-CM

## 2012-06-28 LAB — COMPREHENSIVE METABOLIC PANEL
AST: 29 U/L (ref 0–37)
Albumin: 3.5 g/dL (ref 3.5–5.2)
Calcium: 8.8 mg/dL (ref 8.4–10.5)
Creatinine, Ser: 0.46 mg/dL — ABNORMAL LOW (ref 0.50–1.35)
GFR calc non Af Amer: >90 mL/min (ref >90)
Total Protein: 6.8 g/dL (ref 6.0–8.3)

## 2012-06-28 LAB — CBC WITH DIFFERENTIAL/PLATELET
Basophils Absolute: 0.3 10*3/uL — ABNORMAL HIGH (ref 0.0–0.1)
Eosinophils Absolute: 1.5 10*3/uL — ABNORMAL HIGH (ref 0.0–0.7)
Lymphocytes Relative: 25 % (ref 12–46)
MCH: 30.8 pg (ref 26.0–34.0)
MCHC: 33.5 g/dL (ref 30.0–36.0)
Monocytes Absolute: 1.8 10*3/uL — ABNORMAL HIGH (ref 0.1–1.0)
Neutrophils Relative %: 49 % (ref 43–77)
RDW: 20.7 % — ABNORMAL HIGH (ref 11.5–15.5)

## 2012-06-28 MED ORDER — HYDROMORPHONE HCL PF 2 MG/ML IJ SOLN
2.0000 mg | INTRAMUSCULAR | Status: DC | PRN
  Administered 2012-06-29 (×2): 2 mg via INTRAVENOUS
  Filled 2012-06-28 (×2): qty 1

## 2012-06-28 MED ORDER — POLYETHYLENE GLYCOL 3350 17 G PO PACK
17.0000 g | PACK | Freq: Every day | ORAL | Status: DC
  Administered 2012-06-28: 17 g via ORAL
  Filled 2012-06-28 (×2): qty 1

## 2012-06-28 MED ORDER — SODIUM CHLORIDE 0.9 % IJ SOLN
10.0000 mL | INTRAMUSCULAR | Status: DC | PRN
  Administered 2012-06-29: 10 mL

## 2012-06-28 NOTE — Care Management (Signed)
Patient: [name] DOB :11-28-1980 MRN :[id number]  Date: 06/28/2012  Documentation Initiated by : Jefm Miles PCP: Dr. Marthann Schiller Bed Status:  Inpatient  Subjective/Objective Assessment: Mr. Ledee is a 32 year old male with Hb SS/SCD. Mr. Capers was admitted from 23 hour observation at [facility] for [place] pain crisis. Mr. Ziomek states he is feeling a lot better. Mr. 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  He is sitting up in bed eating his lunch. Mr. Schauer stated he does not really understand why the provider wants him to take Exjade.  Barriers to care:  Need education on Exjade and unable to afford Exjade.    Referral(s) made to: Specialty pharmacy   Prior Approval(PA) #: None needed from patient medical insurance per Mellody Dance at [employer]                        PA start date: NA                 PA end date: NA  Action/Plan: This CM educated patient on the reason for Exjade and provided literature to Mr. Leicht to take home. This CM gave Mr. Kras a mixer to mix Exjade once he receives it. This CM explained to patient that we have to wait for the specialty pharmacy to approve the request for assistance then the specialty pharmacy will call him directly to possibly ship to his home. This is not a guarantee of coverage however the process has been initiated. Comments: Mr. Fitterer states he has no other concerns at this time and will contact this CM if he has any future questions.  Time spent on case management: 35 mins.  Karoline Caldwell, RN, BSN, [place] [phone]

## 2012-06-28 NOTE — Progress Notes (Signed)
SICKLE CELL SERVICE PROGRESS NOTE  [name]:[id number] DOB: 03/29/1980 DOA: 06/26/2012 PCP: Temeca Somma A., MD  Assessment/Plan: Active Problems: 1. Hb SS with crisis: Pt is improving but still having pain not controlled by PCA. Will add IV dosing for rescue dosing. Will continue Toradol. Should be ready for discharge by tomorrow if no worsening function and pain.  2. Leukocytosis: Improved.   3. Anemia: Hb decreased.  Pt has a baseline Hb of 6.5. Will recheck tomorrow. If further decreased will consider transfusion.  4. Chronic PE/Chronic anticoagulation: Continue Xarelto 20 mg daily.  5. HTN: BP well controlled. Continue Lisinopril  Code Status: Full Code Family Communication: N/A Disposition Plan: Home hopefully tomorrow.  [name] A.  Pager [phone]. If 7PM-7AM, please contact night-coverage.  06/28/2012, 6:00 PM  LOS: 2 days   Brief narrative: [name] is a 32 year old African American Male with SS genotype SCD. PMH : hypokalemia, AVN shoulder, hypertension and mood disorder. He was initially treated in the [facility] 23 hr observation for VOC. Treatment included IV hydration , analgesic, antiemetic and antipruitics medication. Note home medications where resumed. He is still rating his pain as 8/10 with little relief. The pain is described as constant throbbing aching. Palliative factors are clenching his ribs but there are no provacative factors. Discussed with Dr. Ashley Royalty tx he will be directly admitted and VOC aggressively treated with PCA and followed. Patient reports his pain has been escalating since last week Saturday.   Consultants:  None  Procedures:  None  Antibiotics:  None  HPI/Subjective: Pt states that he feels pretty good and feels that he will likely be able to go home by tomorrow with regard to his function and pain. He states that the pain is localized to his back only.   Objective: Filed Vitals:   06/28/12 1054 06/28/12  1136 06/28/12 1327 06/28/12 1419  BP: 128/82   121/77  Pulse: 59   72  Temp: 97.6 F (36.4 C)   97.8 F (36.6 C)  TempSrc: Oral   Oral  Resp: 15 18 20 16   Height:      Weight:      SpO2: 99% 100% 100% 99%   Weight change:   Intake/Output Summary (Last 24 hours) at 06/28/12 1800 Last data filed at 06/28/12 1700  Gross per 24 hour  Intake   3460 ml  Output   1950 ml  Net   1510 ml    General: Alert, awake, oriented x3, in no acute distress.  HEENT: [place]/AT PEERL, EOMI, anicteric Neck: Trachea midline,  no masses, no thyromegal,y no JVD, no carotid bruit OROPHARYNX:  Moist, No exudate/ erythema/lesions.  Heart: Regular rate and rhythm, without murmurs, rubs, gallops, PMI non-displaced, no heaves or thrills on palpation.  Lungs: Clear to auscultation, no wheezing or rhonchi noted.  Abdomen: Soft, nontender, nondistended, positive bowel sounds, no masses no hepatosplenomegaly noted.  Neuro: No focal neurological deficits noted cranial nerves II through XII grossly intact. Strength normal in bilateral upper and lower extremities. Musculoskeletal: No warm swelling or erythema around joints. Psychiatric: Patient alert and oriented x3, good insight and cognition and in very good spirits.  Data Reviewed: Basic Metabolic Panel:  Recent Labs Lab 06/22/12 0825 06/26/12 1400 06/28/12 0355  NA 141 140 138  K 3.2* 3.4* 3.5  CL 108 107 107  CO2 22 23 22   GLUCOSE 109* 97 127*  BUN 7 8 7   CREATININE 0.57 0.53 0.46*  CALCIUM 9.0 8.9 8.8  MG  --   --  1.7   Liver Function Tests:  Recent Labs Lab 06/22/12 0825 06/26/12 1400 06/28/12 0355  AST 32 24 29  ALT 14 12 12   ALKPHOS 84 77 70  BILITOT 5.5* 5.9* 3.6*  PROT 7.4 7.8 6.8  ALBUMIN 4.0 4.2 3.5   No results found for this basename: LIPASE, AMYLASE,  in the last 168 hours No results found for this basename: AMMONIA,  in the last 168 hours CBC:  Recent Labs Lab 06/22/12 0825 06/26/12 1400 06/28/12 0355  WBC 20.5* 16.5*  13.9*  NEUTROABS 16.0* 11.3* 6.8  HGB 7.8* 7.6* 6.6*  HCT 22.9* 22.0* 19.7*  MCV 91.2 92.1 92.1  PLT 503* 517* 421*   Cardiac Enzymes: No results found for this basename: CKTOTAL, CKMB, CKMBINDEX, TROPONINI,  in the last 168 hours BNP (last 3 results)  Recent Labs  07/05/11 0620 07/18/11 1500  PROBNP 219.1* 13.5   CBG: No results found for this basename: GLUCAP,  in the last 168 hours  No results found for this or any previous visit (from the past 240 hour(s)).   Studies: Dg Chest 2 View  06/20/2012   *RADIOLOGY REPORT*  Clinical Data: Shortness of breath.  CHEST - 2 VIEW  Comparison: 05/29/2012.  Findings: The power port is stable.  The heart is borderline enlarged but unchanged.  The mediastinal and hilar contours are stable.  There are chronic bronchitic type interstitial lung changes but no infiltrates, edema or effusions.  The bony thorax is intact.  IMPRESSION:  Chronic bronchitic type interstitial lung changes but no acute overlying pulmonary process.   Original Report Authenticated By: Rudie Meyer, M.D.    Scheduled Meds: . deferoxamine (DESFERAL) IV  2,000 mg Intravenous QHS  . folic acid  1 mg Oral q morning - 10a  . HYDROmorphone PCA 0.3 mg/mL   Intravenous Q4H  . hydroxyurea  500 mg Oral BID  . ketorolac  30 mg Intravenous Q6H  . lisinopril  10 mg Oral Q2200  . morphine  15 mg Oral BID  . OLANZapine  10 mg Oral QHS  . polyethylene glycol  17 g Oral Daily  . potassium chloride SA  20 mEq Oral q morning - 10a  . Rivaroxaban  20 mg Oral q morning - 10a  . senna-docusate  1 tablet Oral BID   Continuous Infusions: . dextrose 5 % and 0.45% NaCl 125 mL/hr at 06/27/12 1058  . dextrose 5 % and 0.45% NaCl 100 mL/hr at 06/28/12 1327    Time spent 35 minutes

## 2012-06-29 ENCOUNTER — Telehealth (HOSPITAL_COMMUNITY): Payer: Self-pay

## 2012-06-29 DIAGNOSIS — D5700 Hb-SS disease with crisis, unspecified: Secondary | ICD-10-CM | POA: Diagnosis not present

## 2012-06-29 DIAGNOSIS — Z7901 Long term (current) use of anticoagulants: Secondary | ICD-10-CM

## 2012-06-29 DIAGNOSIS — Z86711 Personal history of pulmonary embolism: Secondary | ICD-10-CM | POA: Insufficient documentation

## 2012-06-29 LAB — COMPREHENSIVE METABOLIC PANEL
ALT: 13 U/L (ref 0–53)
AST: 34 U/L (ref 0–37)
Albumin: 3.5 g/dL (ref 3.5–5.2)
Alkaline Phosphatase: 75 U/L (ref 39–117)
Calcium: 8.6 mg/dL (ref 8.4–10.5)
Glucose, Bld: 124 mg/dL — ABNORMAL HIGH (ref 70–99)
Potassium: 3.5 mEq/L (ref 3.5–5.1)
Sodium: 139 mEq/L (ref 135–145)
Total Protein: 6.6 g/dL (ref 6.0–8.3)

## 2012-06-29 LAB — CBC WITH DIFFERENTIAL/PLATELET
Basophils Relative: 2 % — ABNORMAL HIGH (ref 0–1)
Eosinophils Absolute: 1.5 10*3/uL — ABNORMAL HIGH (ref 0.0–0.7)
Hemoglobin: 6.6 g/dL — CL (ref 13.0–17.0)
Lymphocytes Relative: 24 % (ref 12–46)
MCH: 31.1 pg (ref 26.0–34.0)
MCHC: 33.7 g/dL (ref 30.0–36.0)
Monocytes Absolute: 1.6 10*3/uL — ABNORMAL HIGH (ref 0.1–1.0)
Neutrophils Relative %: 49 % (ref 43–77)
Platelets: 433 10*3/uL — ABNORMAL HIGH (ref 150–400)
RBC: 2.09 MIL/uL — ABNORMAL LOW (ref 4.22–5.81)

## 2012-06-29 LAB — MAGNESIUM: Magnesium: 1.7 mg/dL (ref 1.5–2.5)

## 2012-06-29 IMAGING — CR DG CHEST 2V
2 series · 2 of 2 positions shown · non-contrast
Comparison: Chest radiograph performed 08/04/2009

CLINICAL DATA: Sickle cell crisis; chest pain.

CHEST - 2 VIEW

[w chest pa]
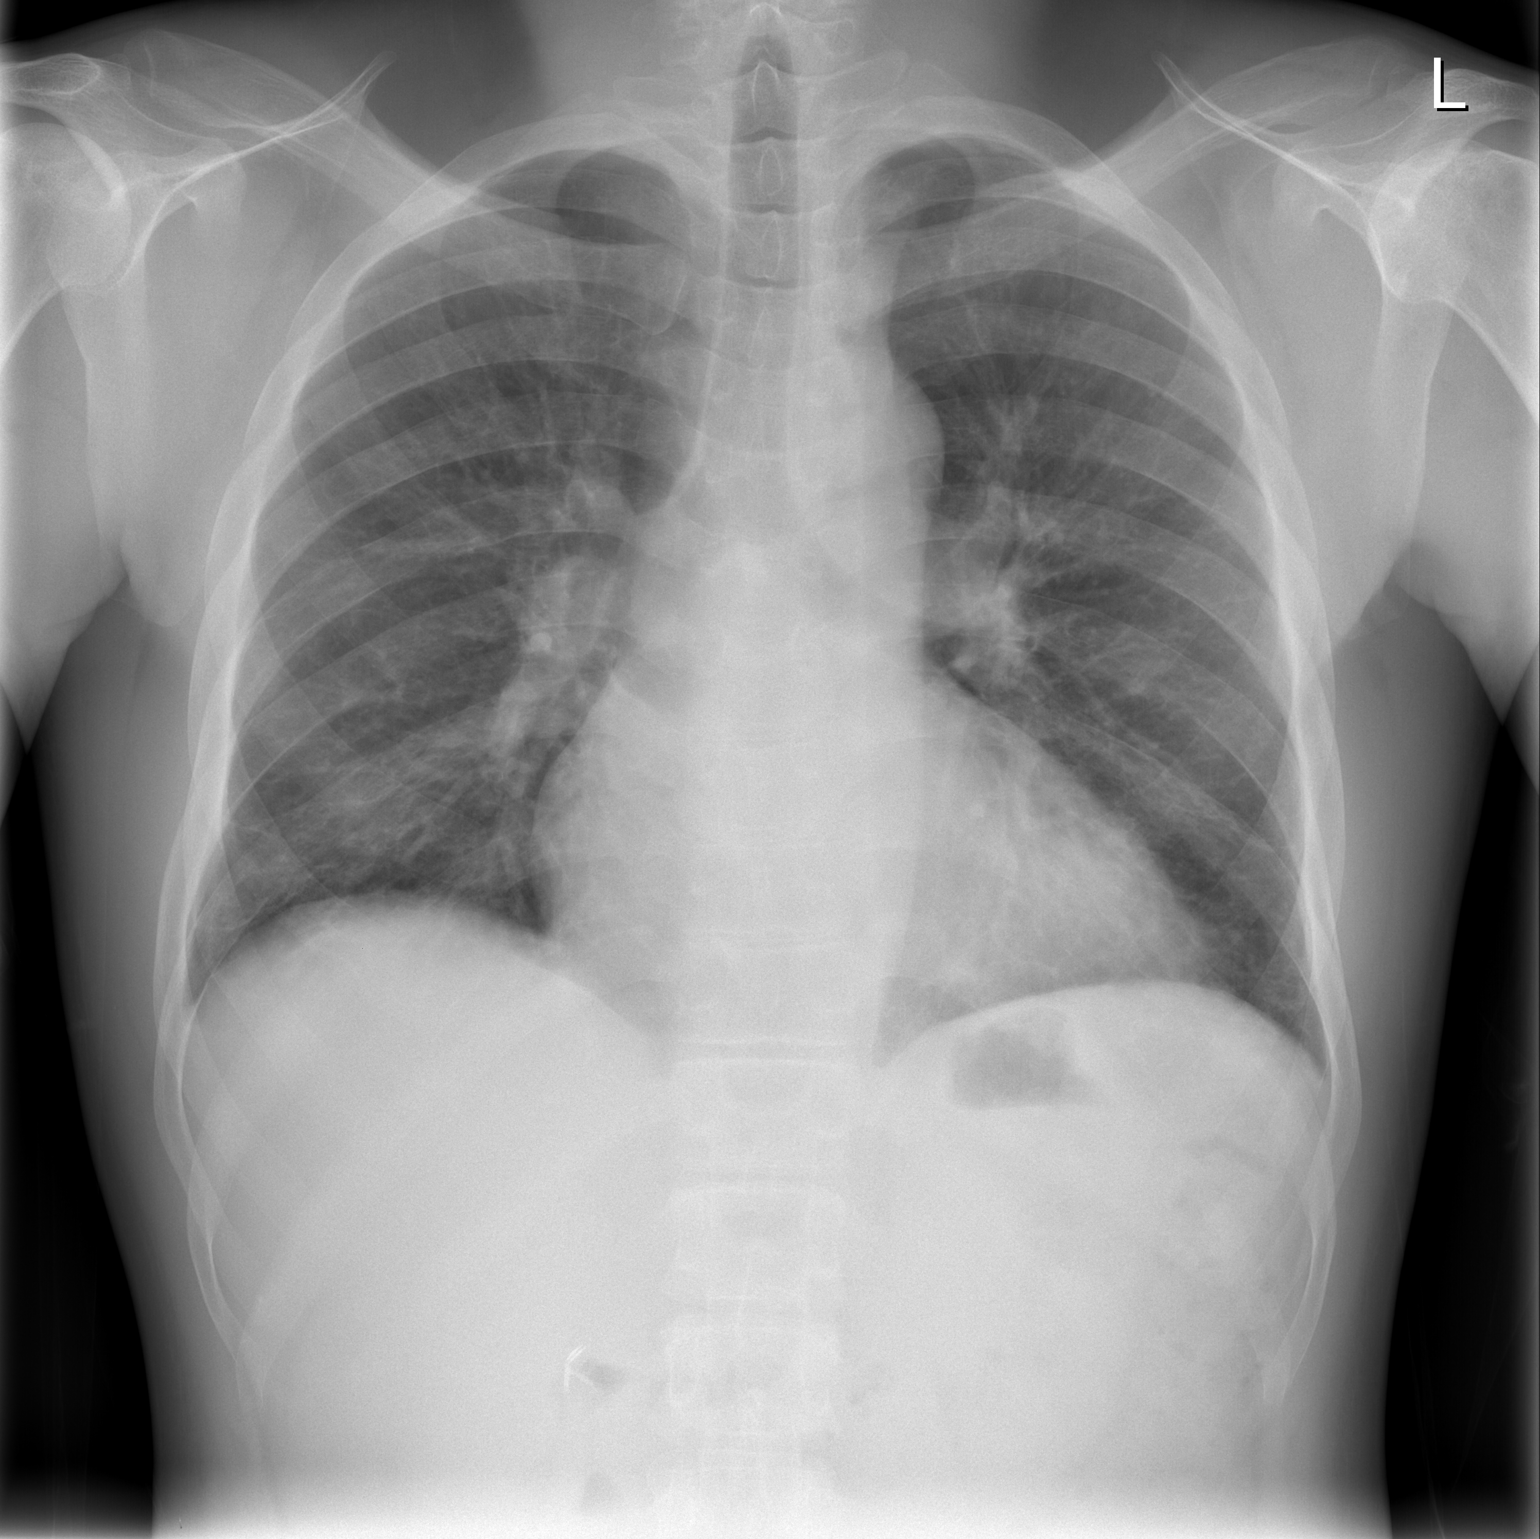

[w chest lat]
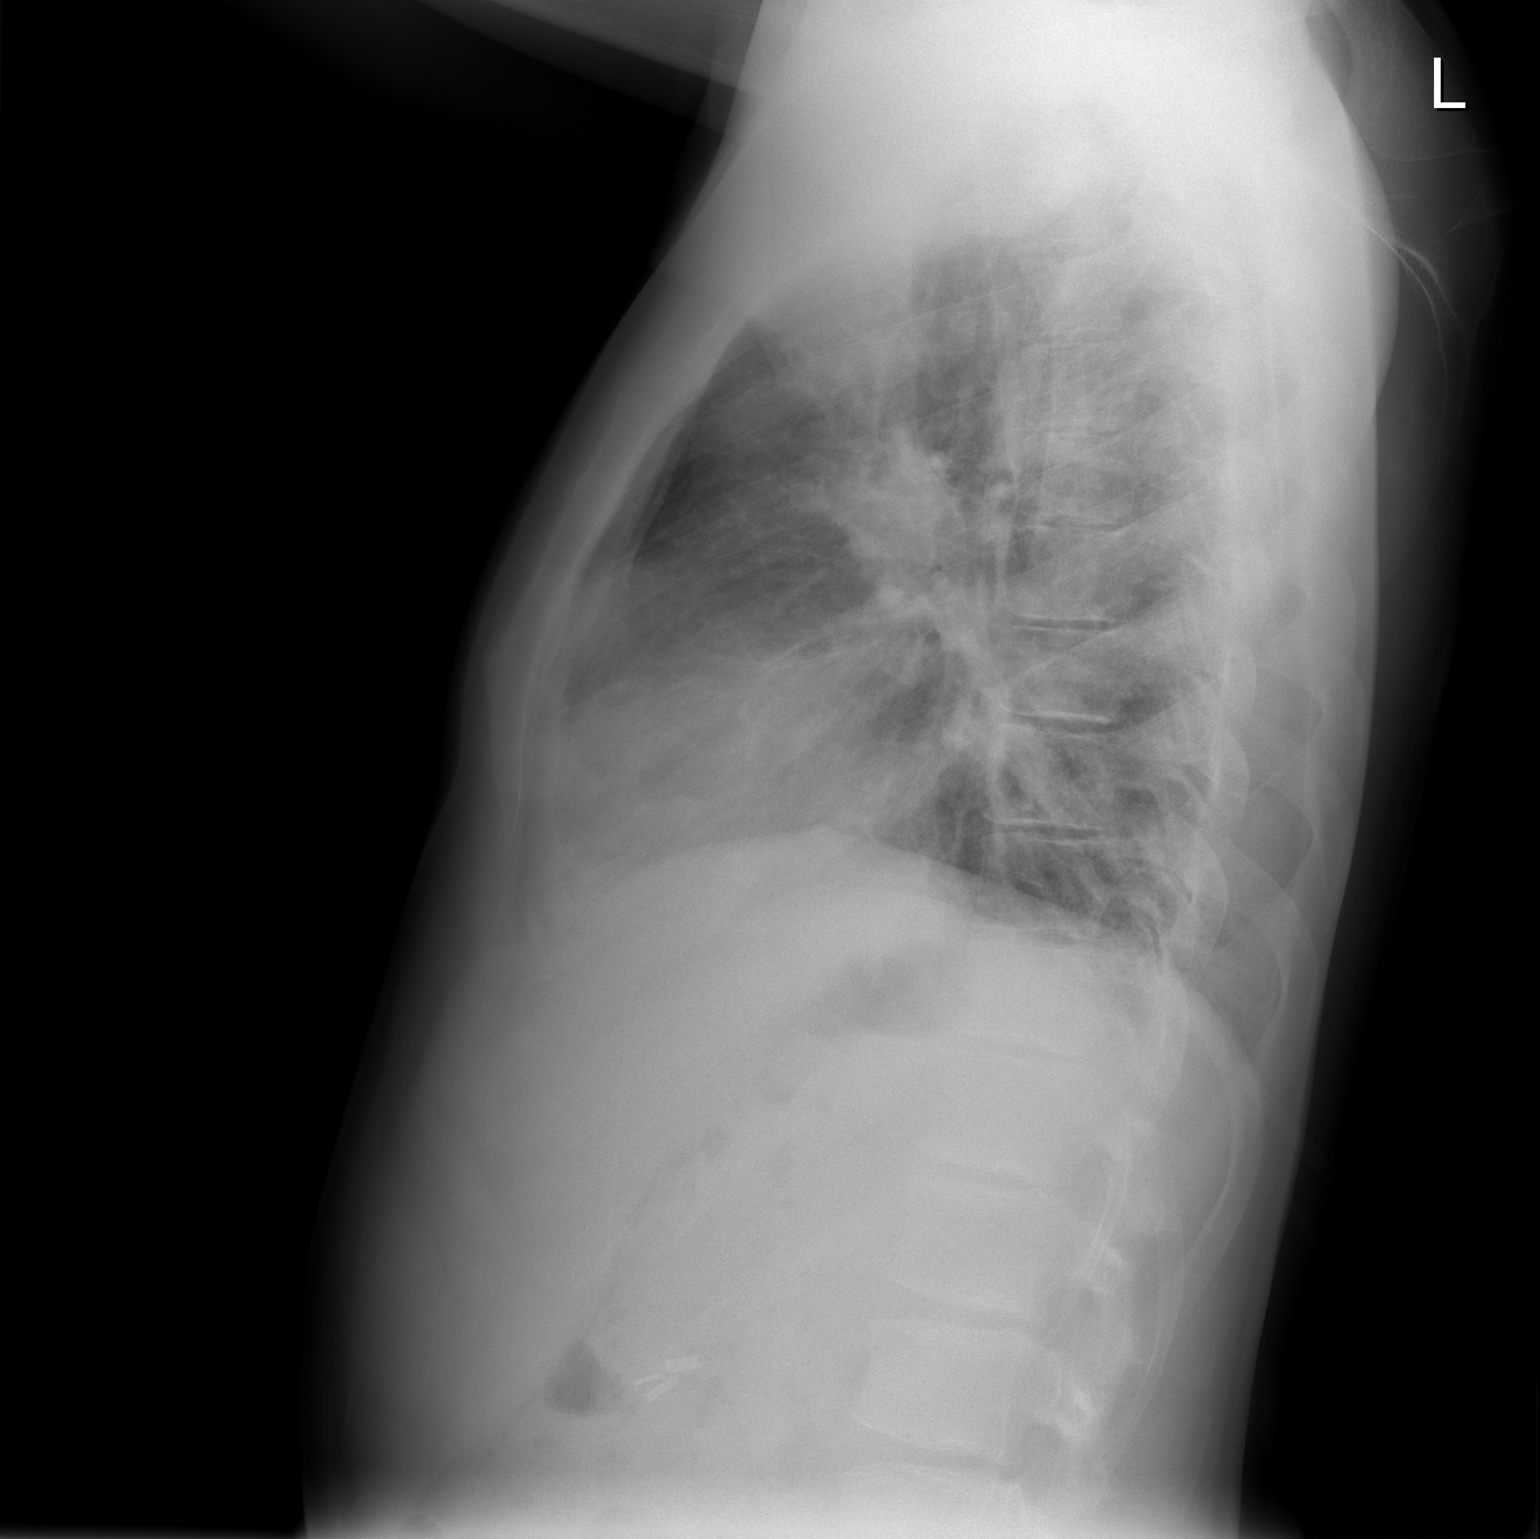

[2 of 2 positions shown; findings below may reference images not displayed]

FINDINGS: Mild bilateral lower lobe airspace opacities are noted,
new from the prior study and likely reflecting acute chest
syndrome.  No pleural effusion or pneumothorax is seen.

The heart is borderline normal in size.  No acute osseous
abnormalities are identified. Minimal anterior wedging of the upper
lumbar spine is likely developmental in nature.  Clips are noted
within the right upper quadrant, reflecting prior cholecystectomy.
IMPRESSION: New mild bilateral lower lobe airspace opacities likely reflect
acute chest syndrome; pneumonia could have a similar appearance,
suggest clinical correlation.

## 2012-06-29 MED ORDER — MORPHINE SULFATE ER 15 MG PO TBCR: 15.0000 mg | EXTENDED_RELEASE_TABLET | Freq: Two times a day (BID) | ORAL | Status: DC

## 2012-06-29 MED ORDER — HEPARIN SOD (PORK) LOCK FLUSH 100 UNIT/ML IV SOLN
500.0000 [IU] | INTRAVENOUS | Status: AC | PRN
  Administered 2012-06-29: 500 [IU]

## 2012-06-29 MED ORDER — HYDROMORPHONE HCL 4 MG PO TABS: 4.0000 mg | ORAL_TABLET | ORAL | Status: DC | PRN

## 2012-06-29 MED ORDER — OXYCODONE HCL 5 MG PO TABS
10.0000 mg | ORAL_TABLET | Freq: Four times a day (QID) | ORAL | Status: DC | PRN
  Filled 2012-06-29: qty 2

## 2012-06-29 NOTE — H&P (Signed)
Pt seen and examined and discussed with NP Cristian Davitt [name]. Agree with assessment and plan. 

## 2012-06-29 NOTE — Progress Notes (Signed)
Discharge instructions given to pt, verbalized understanding. Left the unit in stable condition. 

## 2012-06-29 NOTE — Telephone Encounter (Signed)
Case Management Note: This CM called specialty pharmacy to confirm order for Exjade was received. Per Arline Asp she spoke with Mr. Creps to have Exjade shipped to him and medication will ship tomorrow.    Karoline Caldwell RN, BSN, [place] [phone]

## 2012-06-29 NOTE — Telephone Encounter (Signed)
05/17/2011 07:30 AM Phone (Incoming) [name] [phone]  Called to transfer [facility] to [facility] referred PA to consult with Dr. [name].    By Jama Flavors, RN

## 2012-06-29 NOTE — Progress Notes (Signed)
Assessment/Plan: @PROBHOSP @  LOS: 3 days   Acute Vaso occlusive crisis/ Pain : Patient on full dose hydromorphone PCA but not using - will discontinue. Toradol discontinued as patient will slight blood in urine, oxy IR added PRN. MS contin (home medication) resumed15 mg bid.  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 Elevated bilirubin and reticulocyte count on admission. Hemoglobin 6.6, 2 units PRBC ordered by primary team. Repeat CBC post transfusion. Leukocytosis: mild- likely secondary to acute VOC.  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 He received IV desferal  Constipation: secondary to narcotic use on senokot, add Miralax Hypertension: stable, continue Lisinopril. Mood disorder: Mood appropriate, resume Zyprexa.  H/o DVT and PE: Xarelto continued   Subjective: Patient seen, states pain is 6/10, located in chest and back. States this is his typical sickle cell pain. Patient on full dose PCA pump but has not used any in the last 24hrs, he has asked for his PRN dilaudid three times. Patient states his baseline pain level is 3/10 at home.he has not used the PCa pump today either because he is sleeping. Patient indicates he continues to be in pain however.  He has received one of two units PRBC ordered, the second has just started.  Objective: Weight change:   Intake/Output Summary (Last 24 hours) at 06/29/12 1016 Last data filed at 06/29/12 0820  Gross per 24 hour  Intake   3150 ml  Output   1700 ml  Net   1450 ml   BP 115/77  Pulse 68  Temp(Src) 98.1 F (36.7 C) (Oral)  Resp 18  Ht 6' (1.829 m)  Wt 79.9 kg (176 lb 2.4 oz)  BMI 23.88 kg/m2  SpO2 98%  General Appearance:    Alert, cooperative, no distress, appears stated age  Head:    Normocephalic, without obvious abnormality, atraumatic  Eyes:    PERRL, mild jaundice       Nose:   Nares normal, septum midline, mucosa normal, no drainage    or sinus tenderness  Throat:    Lips, mucosa, and tongue normal; teeth and gums normal  Neck:   Supple, symmetrical, trachea midline, no adenopathy;       thyroid:  No enlargement/tenderness/nodules; no carotid   bruit or JVD  Back:     Symmetric, no curvature, ROM normal, no CVA tenderness  Lungs:     Clear to auscultation bilaterally, respirations unlabored  Chest wall:    No tenderness or deformity  Heart:    Regular rate and rhythm, S1 and S2 normal, no murmur, rub   or gallop  Abdomen:     Soft, non-tender, bowel sounds active all four quadrants,    no masses, no organomegaly        Extremities:   Extremities normal, atraumatic, no cyanosis or edema  Pulses:   2+ and symmetric all extremities  Skin:   Skin color, texture, turgor normal, no rashes or lesions  Lymph nodes:   Cervical, supraclavicular, and axillary nodes normal  Neurologic:   CNII-XII intact. Normal strength, sensation and reflexes      throughout    Lab Results:  Recent Labs  06/28/12 0355 06/29/12 0405  NA 138 139  K 3.5 3.5  CL 107 107  CO2 22 24  GLUCOSE 127* 124*  BUN 7 6  CREATININE 0.46* 0.50  CALCIUM 8.8 8.6  MG 1.7 1.7    Recent Labs  06/28/12 0355 06/29/12 0405  AST 29 34  ALT  12 13  ALKPHOS 70 75  BILITOT 3.6* 3.5*  PROT 6.8 6.6  ALBUMIN 3.5 3.5   No results found for this basename: LIPASE, AMYLASE,  in the last 72 hours  Recent Labs  06/28/12 0355 06/29/12 0405  WBC 13.9* 12.5*  NEUTROABS 6.8 6.1  HGB 6.6* 6.6*  HCT 19.7* 19.3*  MCV 92.1 92.3  PLT 421* 433*   No results found for this basename: CKTOTAL, CKMB, CKMBINDEX, TROPONINI,  in the last 72 hours No components found with this basename: POCBNP,  No results found for this basename: DDIMER,  in the last 72 hours No results found for this basename: HGBA1C,  in the last 72 hours No results found for this basename: CHOL, HDL, LDLCALC, TRIG, CHOLHDL, LDLDIRECT,  in the last 72 hours No results found for this basename: TSH, T4TOTAL, FREET3, T3FREE,  THYROIDAB,  in the last 72 hours  Recent Labs  06/26/12 1400  RETICCTPCT 15.3*    Micro Results: No results found for this or any previous visit (from the past 240 hour(s)).  Studies/Results: Dg Chest 2 View  06/20/2012   *RADIOLOGY REPORT*  Clinical Data: Shortness of breath.  CHEST - 2 VIEW  Comparison: 05/29/2012.  Findings: The power port is stable.  The heart is borderline enlarged but unchanged.  The mediastinal and hilar contours are stable.  There are chronic bronchitic type interstitial lung changes but no infiltrates, edema or effusions.  The bony thorax is intact.  IMPRESSION:  Chronic bronchitic type interstitial lung changes but no acute overlying pulmonary process.   Original Report Authenticated By: Rudie Meyer, M.D.   Medications: Scheduled Meds: . deferoxamine (DESFERAL) IV  2,000 mg Intravenous QHS  . folic acid  1 mg Oral q morning - 10a  . HYDROmorphone PCA 0.3 mg/mL   Intravenous Q4H  . hydroxyurea  500 mg Oral BID  . ketorolac  30 mg Intravenous Q6H  . lisinopril  10 mg Oral Q2200  . morphine  15 mg Oral BID  . OLANZapine  10 mg Oral QHS  . polyethylene glycol  17 g Oral Daily  . potassium chloride SA  20 mEq Oral q morning - 10a  . Rivaroxaban  20 mg Oral q morning - 10a  . senna-docusate  1 tablet Oral BID   Continuous Infusions: . dextrose 5 % and 0.45% NaCl 125 mL/hr at 06/27/12 1058  . dextrose 5 % and 0.45% NaCl 100 mL/hr at 06/28/12 1327   PRN Meds:.diphenhydrAMINE, diphenhydrAMINE, HYDROmorphone (DILAUDID) injection, naloxone, ondansetron (ZOFRAN) IV, ondansetron, sodium chloride, sodium chloride, zolpidem   Blane Worthington 06/29/2012, 10:16 AM

## 2012-06-29 NOTE — H&P (Signed)
Pt seen and examined and discussed with NP [name]. PT to be admitted to hospital for continued management of symptoms.

## 2012-06-29 NOTE — Discharge Summary (Signed)
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     folic acid 1 MG tablet  Commonly known as:  FOLVITE  Take 1 tablet (1 mg total) by mouth every morning.     HYDROmorphone 4 MG tablet  Commonly known as:  DILAUDID  Take 1 tablet (4 mg total) by mouth every 4 (four) hours as needed for pain.     hydroxyurea 500 MG capsule  Commonly known as:  HYDREA  Take 1 capsule (500 mg total) by mouth 2 (two) times daily. May take with food to minimize GI side effects.     ibuprofen 800 MG tablet  Commonly known as:  ADVIL,MOTRIN  Take 1 tablet (800 mg total) by mouth every 8 (eight) hours as needed for pain. Do not exceed use for more than 20/30 days     lisinopril 10 MG tablet  Commonly known as:  PRINIVIL,ZESTRIL  Take 1 tablet (10 mg total) by mouth daily at 10 pm.     morphine 15 MG 12 hr tablet  Commonly known as:  MS CONTIN  Take 1 tablet (15 mg total) by mouth 2 (two) times daily.     OLANZapine 10 MG tablet  Commonly known as:  ZYPREXA  Take 1 tablet (10 mg total) by mouth at bedtime.     potassium chloride SA 20 MEQ tablet  Commonly known as:  K-DUR,KLOR-CON  Take 20 mEq by mouth every morning.     Rivaroxaban 20 MG Tabs  Commonly known as:  XARELTO  Take 20 mg by mouth every morning.     zolpidem 10 MG tablet  Commonly known as:  AMBIEN  Take 1 tablet (10 mg total) by mouth at bedtime as needed. For sleep          Hospital Course: Present on Admission:  This is  a 32y/o male admitted with pain from an acute VOC. He was initially placed on a Dilaudid PCA pump, his home medications were resumed. His pain has decreased, overnight he did not use the PCA pump at all. He was transfused a total of 2 units PRBC, as his admission hemoglobin was 6.6. He was also given a dose of IV desferal. Currently the patient is stable and wants to go home.   Results for orders placed during the hospital encounter of 06/26/12 (from the past 24 hour(s))  CBC WITH DIFFERENTIAL     Status: Abnormal   Collection Time    06/29/12  4:05 AM      Result Value Range   WBC 12.5 (*) 4.0 - 10.5 K/uL   RBC 2.09 (*) 4.22 - 5.81 MIL/uL   Hemoglobin 6.6 (*) 13.0 - 17.0 g/dL   HCT 86.5 (*) 78.4 - 69.6 %   MCV 92.3  78.0 - 100.0 fL   MCH 31.1  26.0 - 34.0 pg   MCHC 33.7  30.0 - 36.0 g/dL   RDW 29.5 (*) 28.4 - 13.2 %   Platelets 433 (*) 150 - 400  K/uL   Neutrophils Relative % 49  43 - 77 %   Lymphocytes Relative 24  12 - 46 %   Monocytes Relative 13 (*) 3 - 12 %   Eosinophils Relative 12 (*) 0 - 5 %   Basophils Relative 2 (*) 0 - 1 %   Neutro Abs 6.1  1.7 - 7.7 K/uL   Lymphs Abs 3.0  0.7 - 4.0 K/uL   Monocytes Absolute 1.6 (*) 0.1 - 1.0 K/uL   Eosinophils Absolute 1.5 (*) 0.0 - 0.7 K/uL   Basophils Absolute 0.3 (*) 0.0 - 0.1 K/uL  MAGNESIUM     Status: None   Collection Time    06/29/12  4:05 AM      Result Value Range   Magnesium 1.7  1.5 - 2.5 mg/dL  COMPREHENSIVE METABOLIC PANEL     Status: Abnormal   Collection Time    06/29/12  4:05 AM      Result Value Range   Sodium 139  135 - 145 mEq/L   Potassium 3.5  3.5 - 5.1 mEq/L   Chloride 107  96 - 112 mEq/L   CO2 24  19 - 32 mEq/L   Glucose, Bld 124 (*) 70 - 99 mg/dL   BUN 6  6 - 23 mg/dL   Creatinine, Ser 4.54  0.50 - 1.35 mg/dL   Calcium 8.6  8.4 - 09.8 mg/dL   Total Protein 6.6  6.0 - 8.3 g/dL   Albumin 3.5  3.5 - 5.2 g/dL   AST 34  0 - 37 U/L   ALT 13  0 - 53 U/L   Alkaline Phosphatase 75  39 - 117 U/L   Total  Bilirubin 3.5 (*) 0.3 - 1.2 mg/dL   GFR calc non Af Amer >90  >90 mL/min   GFR calc Af Amer >90  >90 mL/min  PREPARE RBC (CROSSMATCH)     Status: None   Collection Time    06/29/12  6:09 AM      Result Value Range   Order Confirmation ORDER PROCESSED BY BLOOD BANK      Disposition: Home   Follow-up Appts:      Future Appointments Provider Department Dept Phone   07/05/2012 1:00 PM Grayce Sessions, NP [facility] [phone]      Follow-up with [name] NP, 06/04/12 at 1:00PM.   Tests Needing Follow-up: None  Signed: Ifeoluwa Beller 06/29/2012, 3:12 PM

## 2012-06-30 LAB — TYPE AND SCREEN
ABO/RH(D): O POS
Antibody Screen: NEGATIVE
Unit division: 0

## 2012-06-30 IMAGING — CT CT CHEST W/ CM
2 of 4 series · 15 of 36 positions shown, 18 images · IV contrast (omnipaque)
Comparison: Chest radiograph performed 08/06/2009, and CTA of the
chest performed 10/09/2008

CLINICAL DATA: Chest pain; sickle cell crisis.  Leukocytosis.
History of smoking and pulmonary embolus. Airspace opacities noted
on chest radiograph.

CT CHEST WITH CONTRAST
TECHNIQUE: Multidetector CT imaging of the chest was performed
following the standard protocol during bolus administration of
intravenous contrast.
Contrast: 80 mL of Omnipaque 300 IV contrast

[Series 2: rtn chest with st · axial · 0.68mm/px · z∈[+1087,+1342]mm · 12 of 61 slices shown, 15 images]
[im 5/61  mediastinal]
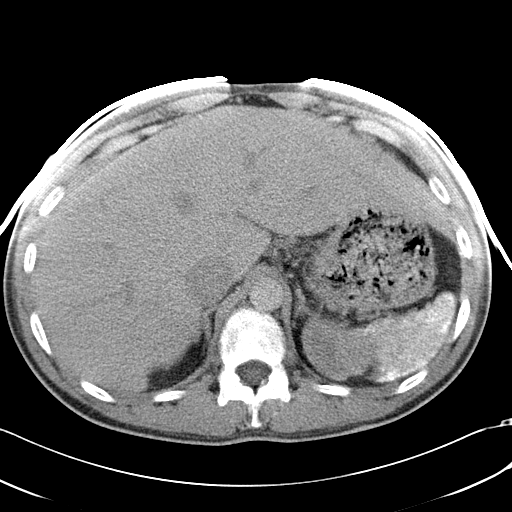
[im 5/61  lung]
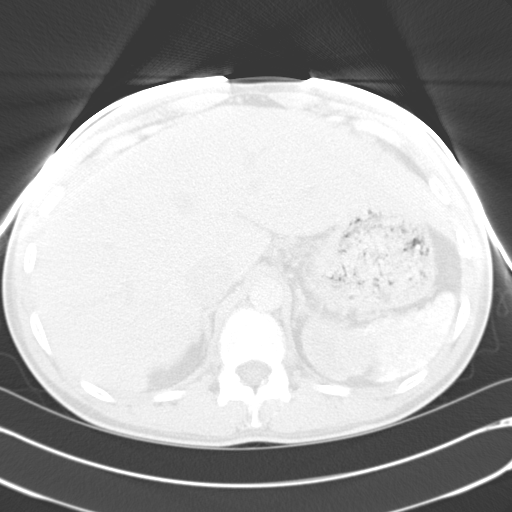
[im 9/61  lung]
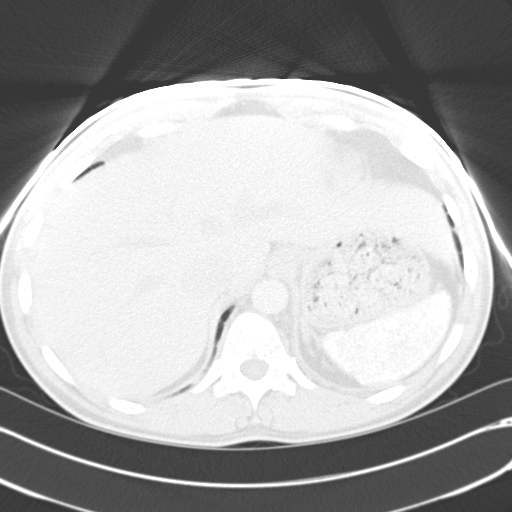
[im 13/61  lung]
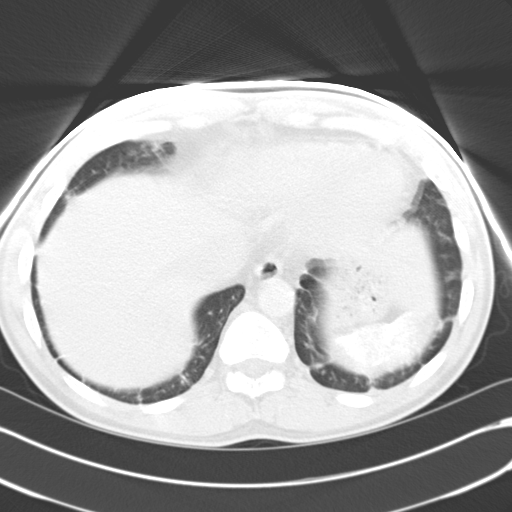
[im 18/61  lung]
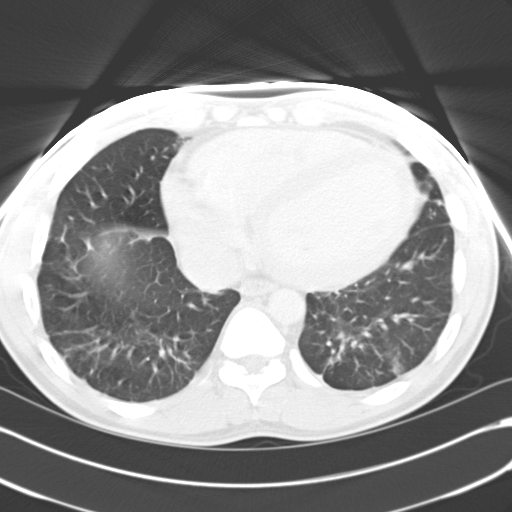
[im 22/61  mediastinal]
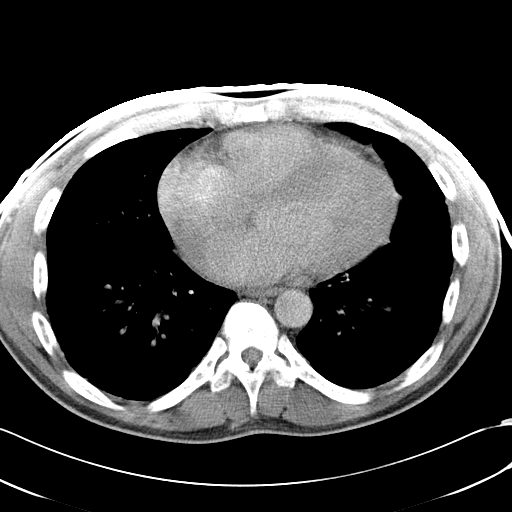
[im 22/61  lung]
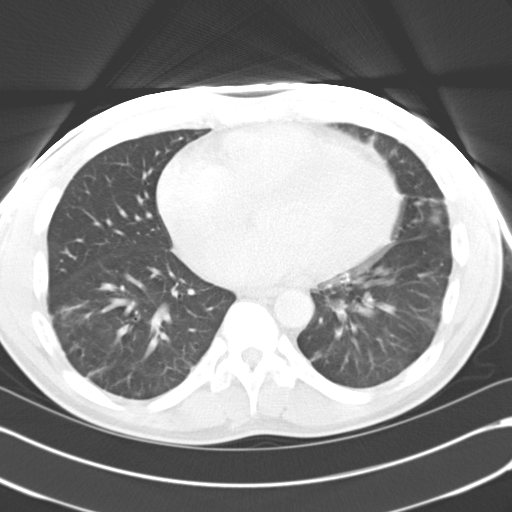
[im 26/61  lung]
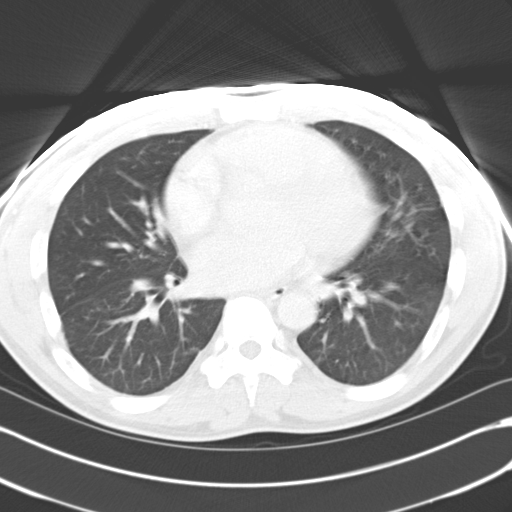
[im 35/61  lung]
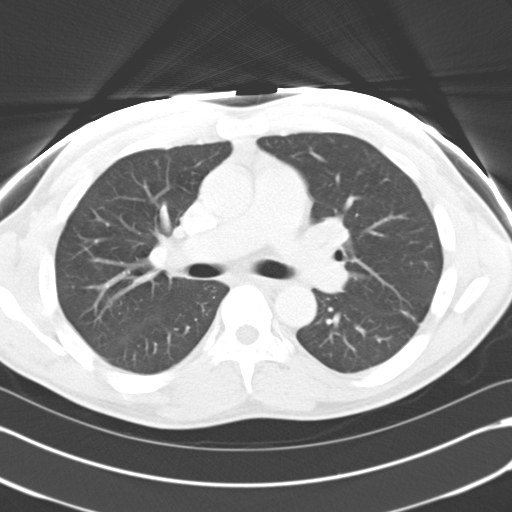
[im 39/61  lung]
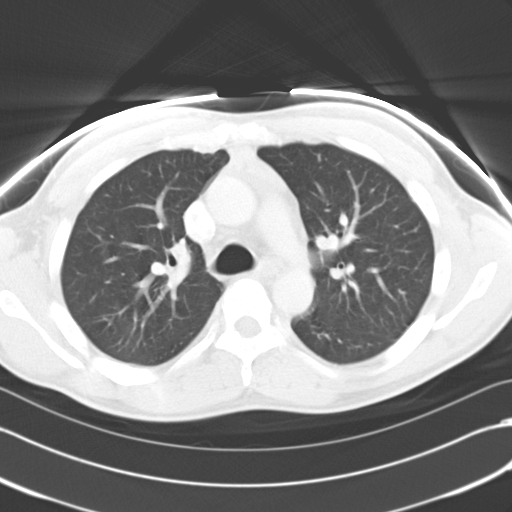
[im 43/61  mediastinal]
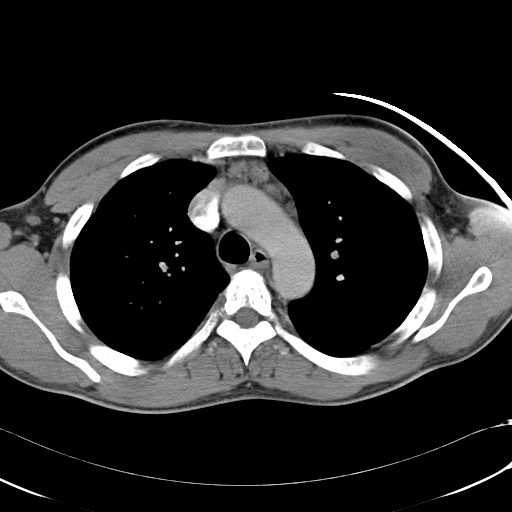
[im 43/61  lung]
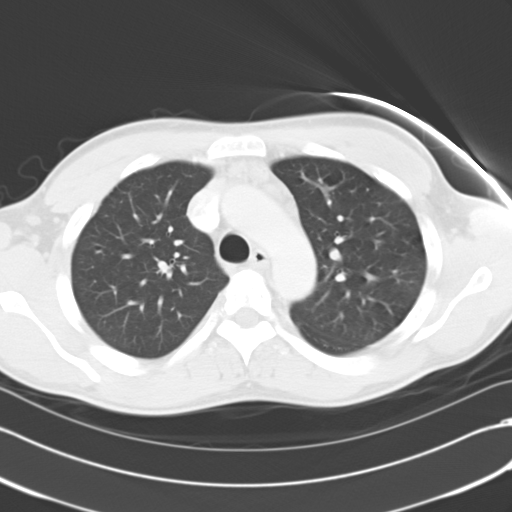
[im 48/61  lung]
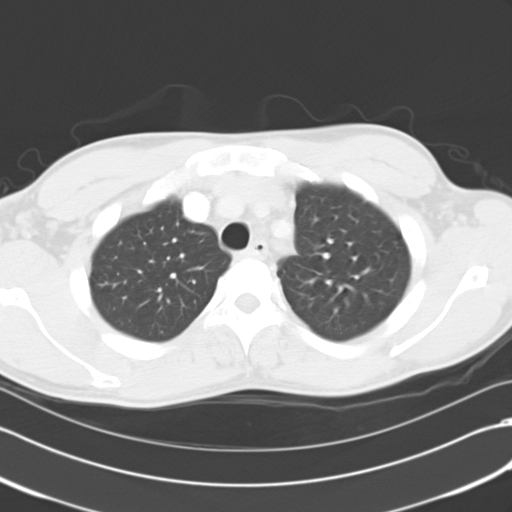
[im 52/61  lung]
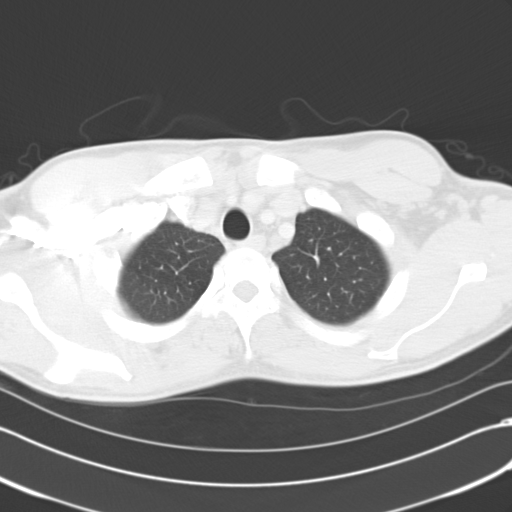
[im 56/61  lung]
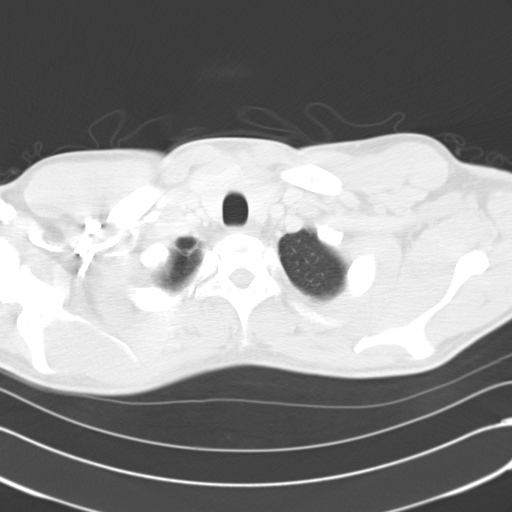

[Series 602: <mpr range> · coronal · 0.68mm/px · 3 of 98 slices shown]
[im 20/98  lung]
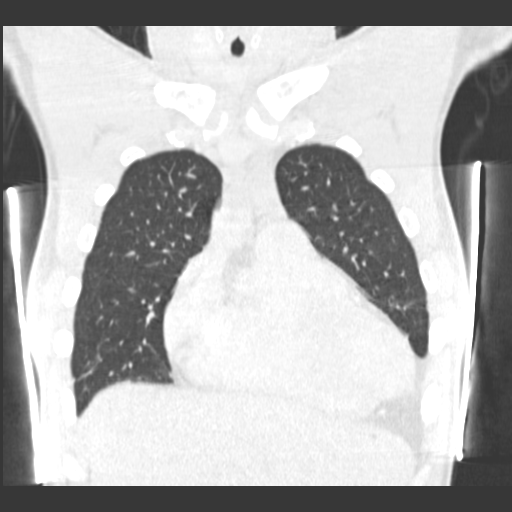
[im 39/98  lung]
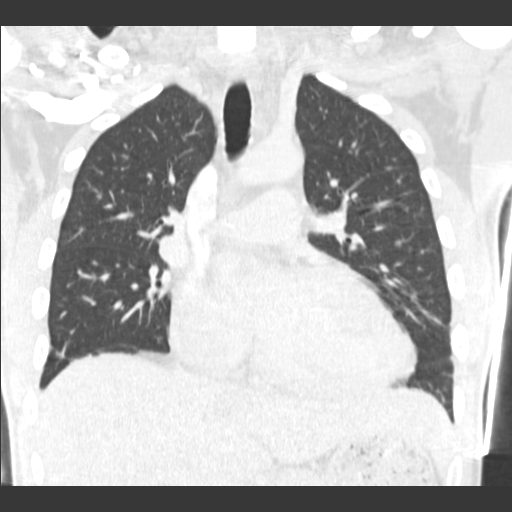
[im 59/98  lung]
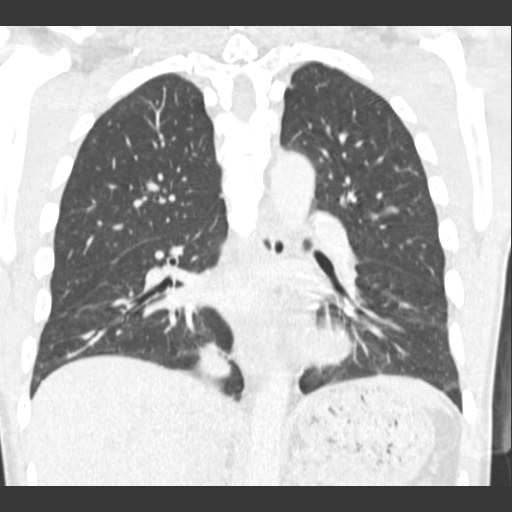

[15 of 36 positions shown; findings below may reference images not displayed]

FINDINGS: Mild patchy predominantly linear opacities are noted
within both lower lung lobes; these likely reflect a combination of
acute chest syndrome and atelectasis.  No definite focal
consolidation is seen to suggest pneumonia.

Additional minimal atelectasis is noted within the left lingula,
and at the left lung apex.  No pleural effusion or pneumothorax is
identified.

The heart is mildly enlarged.  Scattered small periaortic nodes are
seen, without evidence of mediastinal lymphadenopathy.  Residual
thymic tissue remains within normal limits.  No pericardial
effusion is identified.  The great vessels are unremarkable in
appearance.  The visualized portions of the thyroid gland are
within normal limits.  No axillary lymphadenopathy is evident.

The visualized portions of the liver are unremarkable.  There is
diffuse calcification throughout the spleen, compatible with
partial auto-splenectomy.  The visualized portions of the adrenal
glands, stomach, pancreas and kidneys are unremarkable.

Mild diffuse sclerotic change within the visualized osseous
structures, particularly along the thoracic spine, reflects the
patient's history of sickle cell disease.  Several H-shaped
vertebral bodies are identified at the mid-thoracic spine.
IMPRESSION: 1.  Mild patchy predominantly linear opacities within both lower
lung lobes likely reflect a combination of acute chest syndrome and
atelectasis; no definite evidence for pneumonia.
2.  Minimal atelectasis noted within the left lingula and at the
left lung apex.
3.  Mild cardiomegaly.
4.  Diffuse splenic calcification, compatible with partial auto-
splenectomy.
5.  Chronic changes along the thoracic spine compatible with the
patient's history of sickle cell disease.

## 2012-07-03 DIAGNOSIS — R079 Chest pain, unspecified: Secondary | ICD-10-CM | POA: Diagnosis not present

## 2012-07-03 DIAGNOSIS — D5700 Hb-SS disease with crisis, unspecified: Secondary | ICD-10-CM | POA: Diagnosis not present

## 2012-07-03 DIAGNOSIS — D571 Sickle-cell disease without crisis: Secondary | ICD-10-CM | POA: Diagnosis not present

## 2012-07-04 DIAGNOSIS — D5700 Hb-SS disease with crisis, unspecified: Secondary | ICD-10-CM | POA: Diagnosis not present

## 2012-07-05 ENCOUNTER — Ambulatory Visit: Payer: Medicare Other | Admitting: Primary Care

## 2012-07-05 DIAGNOSIS — D5700 Hb-SS disease with crisis, unspecified: Secondary | ICD-10-CM | POA: Diagnosis not present

## 2012-07-05 NOTE — Discharge Summary (Signed)
Pt seen and examined and discussed with NP Capitola Ladson [name]. Agree with assessment and plan. 

## 2012-07-09 ENCOUNTER — Inpatient Hospital Stay (HOSPITAL_COMMUNITY)
Admission: EM | Admit: 2012-07-09 | Discharge: 2012-07-13 | DRG: 175 | Disposition: A | Discharge status: Home / Self Care | Payer: Medicare Other | Attending: Internal Medicine | Admitting: Internal Medicine

## 2012-07-09 ENCOUNTER — Encounter (HOSPITAL_COMMUNITY): Payer: Self-pay | Admitting: Emergency Medicine

## 2012-07-09 ENCOUNTER — Emergency Department (HOSPITAL_COMMUNITY): Payer: Medicare Other

## 2012-07-09 DIAGNOSIS — D72829 Elevated white blood cell count, unspecified: Secondary | ICD-10-CM | POA: Diagnosis not present

## 2012-07-09 DIAGNOSIS — J9819 Other pulmonary collapse: Secondary | ICD-10-CM | POA: Diagnosis not present

## 2012-07-09 DIAGNOSIS — Z7901 Long term (current) use of anticoagulants: Secondary | ICD-10-CM | POA: Diagnosis not present

## 2012-07-09 DIAGNOSIS — D5700 Hb-SS disease with crisis, unspecified: Secondary | ICD-10-CM | POA: Diagnosis not present

## 2012-07-09 DIAGNOSIS — R071 Chest pain on breathing: Secondary | ICD-10-CM | POA: Diagnosis not present

## 2012-07-09 DIAGNOSIS — I2699 Other pulmonary embolism without acute cor pulmonale: Principal | ICD-10-CM | POA: Diagnosis present

## 2012-07-09 DIAGNOSIS — R079 Chest pain, unspecified: Secondary | ICD-10-CM | POA: Diagnosis not present

## 2012-07-09 DIAGNOSIS — Z87891 Personal history of nicotine dependence: Secondary | ICD-10-CM

## 2012-07-09 DIAGNOSIS — E876 Hypokalemia: Secondary | ICD-10-CM | POA: Diagnosis not present

## 2012-07-09 DIAGNOSIS — I10 Essential (primary) hypertension: Secondary | ICD-10-CM | POA: Diagnosis present

## 2012-07-09 DIAGNOSIS — Z91199 Patient's noncompliance with other medical treatment and regimen due to unspecified reason: Secondary | ICD-10-CM

## 2012-07-09 DIAGNOSIS — D571 Sickle-cell disease without crisis: Secondary | ICD-10-CM | POA: Diagnosis present

## 2012-07-09 DIAGNOSIS — Z86711 Personal history of pulmonary embolism: Secondary | ICD-10-CM

## 2012-07-09 DIAGNOSIS — Z79899 Other long term (current) drug therapy: Secondary | ICD-10-CM

## 2012-07-09 DIAGNOSIS — R0789 Other chest pain: Secondary | ICD-10-CM

## 2012-07-09 DIAGNOSIS — Z9119 Patient's noncompliance with other medical treatment and regimen: Secondary | ICD-10-CM | POA: Diagnosis not present

## 2012-07-09 DIAGNOSIS — Z96649 Presence of unspecified artificial hip joint: Secondary | ICD-10-CM | POA: Diagnosis not present

## 2012-07-09 LAB — CBC
MCH: 30.2 pg (ref 26.0–34.0)
MCHC: 34.4 g/dL (ref 30.0–36.0)
MCV: 87.8 fL (ref 78.0–100.0)
Platelets: 538 10*3/uL — ABNORMAL HIGH (ref 150–400)
RBC: 2.55 MIL/uL — ABNORMAL LOW (ref 4.22–5.81)
RDW: 20.7 % — ABNORMAL HIGH (ref 11.5–15.5)

## 2012-07-09 LAB — POCT I-STAT, CHEM 8
BUN: 4 mg/dL — ABNORMAL LOW (ref 6–23)
Calcium, Ion: 1.19 mmol/L (ref 1.12–1.23)
Hemoglobin: 8.5 g/dL — ABNORMAL LOW (ref 13.0–17.0)
Sodium: 142 mEq/L (ref 135–145)
TCO2: 22 mmol/L (ref 0–100)

## 2012-07-09 LAB — HEPATIC FUNCTION PANEL
ALT: 16 U/L (ref 0–53)
AST: 26 U/L (ref 0–37)
Albumin: 3.9 g/dL (ref 3.5–5.2)
Alkaline Phosphatase: 79 U/L (ref 39–117)
Bilirubin, Direct: 0.4 mg/dL — ABNORMAL HIGH (ref 0.0–0.3)
Total Bilirubin: 6.1 mg/dL — ABNORMAL HIGH (ref 0.3–1.2)

## 2012-07-09 MED ORDER — COUMADIN BOOK
Freq: Once | Status: DC
  Filled 2012-07-09: qty 1

## 2012-07-09 MED ORDER — HYDROMORPHONE HCL PF 2 MG/ML IJ SOLN
2.0000 mg | INTRAMUSCULAR | Status: DC | PRN
  Administered 2012-07-09 – 2012-07-10 (×7): 2 mg via INTRAVENOUS
  Filled 2012-07-09 (×8): qty 1

## 2012-07-09 MED ORDER — HYDROMORPHONE HCL PF 1 MG/ML IJ SOLN
1.0000 mg | Freq: Once | INTRAMUSCULAR | Status: AC
  Administered 2012-07-09: 1 mg via INTRAVENOUS
  Filled 2012-07-09: qty 1

## 2012-07-09 MED ORDER — ENOXAPARIN SODIUM 80 MG/0.8ML ~~LOC~~ SOLN
80.0000 mg | Freq: Two times a day (BID) | SUBCUTANEOUS | Status: DC
  Filled 2012-07-09: qty 0.8

## 2012-07-09 MED ORDER — HYDROXYUREA 500 MG PO CAPS
500.0000 mg | ORAL_CAPSULE | Freq: Two times a day (BID) | ORAL | Status: DC
  Administered 2012-07-09 – 2012-07-13 (×8): 500 mg via ORAL
  Filled 2012-07-09 (×10): qty 1

## 2012-07-09 MED ORDER — POTASSIUM CHLORIDE CRYS ER 20 MEQ PO TBCR
20.0000 meq | EXTENDED_RELEASE_TABLET | Freq: Every morning | ORAL | Status: DC
  Administered 2012-07-10 – 2012-07-13 (×4): 20 meq via ORAL
  Filled 2012-07-09 (×4): qty 1

## 2012-07-09 MED ORDER — WARFARIN - PHARMACIST DOSING INPATIENT: Freq: Every day | Status: DC

## 2012-07-09 MED ORDER — SODIUM CHLORIDE 0.9 % IV SOLN
INTRAVENOUS | Status: DC
  Administered 2012-07-09 – 2012-07-13 (×5): via INTRAVENOUS

## 2012-07-09 MED ORDER — MORPHINE SULFATE 15 MG PO TABS
15.0000 mg | ORAL_TABLET | Freq: Two times a day (BID) | ORAL | Status: DC
  Administered 2012-07-09 – 2012-07-11 (×4): 15 mg via ORAL
  Filled 2012-07-09 (×4): qty 1

## 2012-07-09 MED ORDER — IOHEXOL 350 MG/ML SOLN
100.0000 mL | Freq: Once | INTRAVENOUS | Status: AC | PRN
  Administered 2012-07-09: 100 mL via INTRAVENOUS

## 2012-07-09 MED ORDER — HYDROMORPHONE HCL PF 2 MG/ML IJ SOLN
2.0000 mg | Freq: Once | INTRAMUSCULAR | Status: AC
  Administered 2012-07-09: 2 mg via INTRAVENOUS
  Filled 2012-07-09: qty 1

## 2012-07-09 MED ORDER — WARFARIN SODIUM 10 MG PO TABS
10.0000 mg | ORAL_TABLET | Freq: Once | ORAL | Status: DC
  Filled 2012-07-09: qty 1

## 2012-07-09 MED ORDER — RIVAROXABAN 20 MG PO TABS: 20.0000 mg | ORAL_TABLET | Freq: Every day | ORAL | Status: DC

## 2012-07-09 MED ORDER — ACETAMINOPHEN 325 MG PO TABS
650.0000 mg | ORAL_TABLET | Freq: Four times a day (QID) | ORAL | Status: DC | PRN
  Administered 2012-07-13: 650 mg via ORAL
  Filled 2012-07-09: qty 2

## 2012-07-09 MED ORDER — ZOLPIDEM TARTRATE 10 MG PO TABS
10.0000 mg | ORAL_TABLET | Freq: Every evening | ORAL | Status: DC | PRN
  Administered 2012-07-10 – 2012-07-11 (×3): 10 mg via ORAL
  Filled 2012-07-09 (×3): qty 1

## 2012-07-09 MED ORDER — RIVAROXABAN 15 MG PO TABS
15.0000 mg | ORAL_TABLET | Freq: Two times a day (BID) | ORAL | Status: DC
  Administered 2012-07-10 – 2012-07-13 (×7): 15 mg via ORAL
  Filled 2012-07-09 (×10): qty 1

## 2012-07-09 MED ORDER — HYDROMORPHONE HCL PF 1 MG/ML IJ SOLN
1.0000 mg | INTRAMUSCULAR | Status: DC | PRN
  Administered 2012-07-09: 1 mg via INTRAVENOUS
  Filled 2012-07-09: qty 1

## 2012-07-09 MED ORDER — SODIUM CHLORIDE 0.9 % IJ SOLN
3.0000 mL | Freq: Two times a day (BID) | INTRAMUSCULAR | Status: DC
  Administered 2012-07-10 – 2012-07-11 (×2): 3 mL via INTRAVENOUS

## 2012-07-09 MED ORDER — ACETAMINOPHEN 650 MG RE SUPP: 650.0000 mg | Freq: Four times a day (QID) | RECTAL | Status: DC | PRN

## 2012-07-09 MED ORDER — SODIUM CHLORIDE 0.9 % IV SOLN: INTRAVENOUS | Status: AC

## 2012-07-09 MED ORDER — WARFARIN VIDEO: Freq: Once | Status: DC

## 2012-07-09 MED ORDER — SODIUM CHLORIDE 0.9 % IJ SOLN
10.0000 mL | INTRAMUSCULAR | Status: DC | PRN
  Administered 2012-07-09 – 2012-07-11 (×3): 10 mL

## 2012-07-09 MED ORDER — POTASSIUM CHLORIDE CRYS ER 20 MEQ PO TBCR
40.0000 meq | EXTENDED_RELEASE_TABLET | Freq: Once | ORAL | Status: AC
  Administered 2012-07-09: 40 meq via ORAL
  Filled 2012-07-09: qty 2

## 2012-07-09 MED ORDER — FOLIC ACID 1 MG PO TABS
1.0000 mg | ORAL_TABLET | Freq: Every morning | ORAL | Status: DC
  Administered 2012-07-10 – 2012-07-13 (×4): 1 mg via ORAL
  Filled 2012-07-09 (×4): qty 1

## 2012-07-09 MED ORDER — ONDANSETRON HCL 4 MG/2ML IJ SOLN: 4.0000 mg | INTRAMUSCULAR | Status: DC | PRN

## 2012-07-09 NOTE — ED Notes (Signed)
Pt c/o of left sided chest pain that radiates around to rib cage, started the morning. Also c/o of cough. NAD at this time.

## 2012-07-09 NOTE — Progress Notes (Signed)
ANTICOAGULATION CONSULT NOTE - Initial Consult  Pharmacy Consult for Xarelto Indication: pulmonary embolus  Allergies  Allergen Reactions  . Morphine And Related Hives and Rash    Pt states he is able to tolerate Dilaudid with no reactions.    Patient Measurements: Height: 6' (182.9 cm) Weight: 175 lb (79.379 kg) IBW/kg (Calculated) : 77.6  Vital Signs: Temp: 98.7 F (37.1 C) (06/09 1631) Temp src: Oral (06/09 1631) BP: 111/69 mmHg (06/09 1631) Pulse Rate: 77 (06/09 1631)  Labs:  Recent Labs  07/09/12 1230 07/09/12 1248  HGB 7.7* 8.5*  HCT 22.4* 25.0*  PLT 538*  --   CREATININE  --  0.40*    Estimated Creatinine Clearance: 144.2 ml/min (by C-G formula based on Cr of 0.4).   Medical History: Past Medical History  Diagnosis Date  . Sickle cell anemia   . Blood transfusion   . 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 Hypokalemia   . Mood disorder   . Pulmonary embolism   . Avascular necrosis   . Leukocytosis     Chronic  . Thrombocytosis     Chronic  . Hypertension     Assessment: 30 yoM presenting with chest pain. CT Angio shows new pulmonary emboli identified in bilateral upper lobe and left lower lobe pulmonary arteries. PMH significant for sickle cell anemia and on chronic anticoagulation with Xarelto for history of PE.  Upon initial evaluation, plan was to start Lovenox/warfarin for what would have been considered a Xarelto failure; however, patient reports that he recently went out of town and missed almost 10 days Xarelto.  Will restart Xarelto treatment dosing of new PE.  Patient reports taking Xarelto 20 mg dose at ~2200 last night (07/08/12)  SCr low at 0.40.   Hgb low at 8.5 secondary to sickle cell disease. Platelets 538K.  Goal of Therapy:  Monitor platelets by anticoagulation protocol: Yes   Plan:  Restart Xarelto dosing for new PE.  Begin Xarelto 15 mg BID with meals x 21 days then adjust dose to 20 mg daily with  food.  Clance Boll 07/09/2012,6:09 PM

## 2012-07-09 NOTE — ED Provider Notes (Addendum)
History     CSN: [id number]  Arrival date & time 07/09/12  1157   First MD Initiated Contact with Patient 07/09/12 1218      Chief Complaint  Patient presents with  . Chest Pain    (Consider location/radiation/quality/duration/timing/severity/associated sxs/prior treatment) Patient is a 32 y.o. male presenting with chest pain.  Chest Pain  Presents with left-sided pleuritic chest pain onset upon awakening 5 AM today. Pain is nonradiating worse with deep breath no fever no cough feels like pain he's had from sickle cell crisis in the past. Treated himself with morphine without adequate pain relief. No other associated symptoms Past Medical History  Diagnosis Date  . Sickle cell anemia   . Blood transfusion   . 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 Hypokalemia   . Mood disorder   . Pulmonary embolism   . Avascular necrosis   . Leukocytosis     Chronic  . Thrombocytosis     Chronic  . Hypertension     Past Surgical History  Procedure Laterality Date  . Right hip replacement      08/2006  . Cholecystectomy      01/2008  . Porta cath placement    . Porta cath removal    . Umbilical hernia repair      01/2008  . Excision of left periauricular cyst      10/2009  . Excision of right ear lobe cyst with primary closur      11/2007  . Portacath placement  01/05/2012    Procedure: INSERTION PORT-A-CATH;  Surgeon: Adolph Pollack, MD;  Location: [facility] OR;  Service: General;  Laterality: N/A;  ultrasound guiced port a cath insertion with fluoroscopy    Family History  Problem Relation Age of Onset  . Sickle cell anemia Mother   . Sickle cell anemia Father   . Sickle cell trait Brother     History  Substance Use Topics  . Smoking status: Former Smoker -- 13 years    Types: Cigarettes    Quit date: 07/08/2010  . Smokeless tobacco: Never Used  . Alcohol Use: No      Review of Systems  Constitutional: Negative.   HENT: Negative.    Respiratory: Negative.   Cardiovascular: Positive for chest pain.  Gastrointestinal: Negative.   Musculoskeletal: Negative.   Skin: Negative.   Neurological: Negative.   Hematological:       Baseline hemoglobin 8.5  Psychiatric/Behavioral: Negative.   All other systems reviewed and are negative.    Allergies  Morphine and related  Home Medications   Current Outpatient Rx  Name  Route  Sig  Dispense  Refill  . diphenhydrAMINE (BENADRYL) 25 mg capsule   Oral   Take 1-2 capsules (25-50 mg total) by mouth every 4 (four) hours as needed for itching.   30 capsule   0   . folic acid (FOLVITE) 1 MG tablet   Oral   Take 1 tablet (1 mg total) by mouth every morning.   30 tablet   11   . HYDROmorphone (DILAUDID) 4 MG tablet   Oral   Take 1 tablet (4 mg total) by mouth every 4 (four) hours as needed for pain.   25 tablet   0   . hydroxyurea (HYDREA) 500 MG capsule   Oral   Take 1 capsule (500 mg total) by mouth 2 (two) times daily. May take with food to minimize GI side effects.   60  capsule   2   . ibuprofen (ADVIL,MOTRIN) 800 MG tablet   Oral   Take 1 tablet (800 mg total) by mouth every 8 (eight) hours as needed for pain. Do not exceed use for more than 20/30 days   30 tablet   0   . lisinopril (PRINIVIL,ZESTRIL) 10 MG tablet   Oral   Take 1 tablet (10 mg total) by mouth daily at 10 pm.   30 tablet   2   . morphine (MS CONTIN) 15 MG 12 hr tablet   Oral   Take 1 tablet (15 mg total) by mouth 2 (two) times daily.   28 tablet   0   . OLANZapine (ZYPREXA) 10 MG tablet   Oral   Take 1 tablet (10 mg total) by mouth at bedtime.   30 tablet   3   . potassium chloride SA (K-DUR,KLOR-CON) 20 MEQ tablet   Oral   Take 20 mEq by mouth every morning.          . Rivaroxaban (XARELTO) 20 MG TABS   Oral   Take 20 mg by mouth every morning.         . zolpidem (AMBIEN) 10 MG tablet   Oral   Take 1 tablet (10 mg total) by mouth at bedtime as needed. For sleep    30 tablet   0     Pulse 93  Temp(Src) 98.8 F (37.1 C) (Oral)  Resp 27  SpO2 95%  Physical Exam  Nursing note and vitals reviewed. Constitutional: He appears well-developed and well-nourished.  HENT:  Head: Normocephalic and atraumatic.  Eyes: Conjunctivae are normal. Pupils are equal, round, and reactive to light. Scleral icterus is present.  Neck: Neck supple. No tracheal deviation present. No thyromegaly present.  Cardiovascular: Normal rate, regular rhythm and intact distal pulses.   No murmur heard. Pulmonary/Chest: Effort normal and breath sounds normal.  Abdominal: Soft. Bowel sounds are normal. He exhibits no distension. There is no tenderness.  Musculoskeletal: Normal range of motion. He exhibits no edema and no tenderness.  Neurological: He is alert. Coordination normal.  Skin: Skin is warm and dry. No rash noted.  Psychiatric: He has a normal mood and affect.    ED Course  Procedures (including critical care time)  Labs Reviewed  HEPATIC FUNCTION PANEL  CBC   No results found.   No diagnosis found.   Date: 07/09/2012  Rate: 90  Rhythm: normal sinus rhythm  QRS Axis: left  Intervals: normal  ST/T Wave abnormalities: Anterior  wall ischemic changes  Conduction Disutrbances:none  Narrative Interpretation:   Old EKG Reviewed: Inferior wall ischemic changes from 05/29/2012 have resolved Results for orders placed during the hospital encounter of 07/09/12  HEPATIC FUNCTION PANEL      Result Value Range   Total Protein 7.4  6.0 - 8.3 g/dL   Albumin 3.9  3.5 - 5.2 g/dL   AST 26  0 - 37 U/L   ALT 16  0 - 53 U/L   Alkaline Phosphatase 79  39 - 117 U/L   Total Bilirubin 6.1 (*) 0.3 - 1.2 mg/dL   Bilirubin, Direct 0.4 (*) 0.0 - 0.3 mg/dL   Indirect Bilirubin 5.7 (*) 0.3 - 0.9 mg/dL  CBC      Result Value Range   WBC 21.4 (*) 4.0 - 10.5 K/uL   RBC 2.55 (*) 4.22 - 5.81 MIL/uL   Hemoglobin 7.7 (*) 13.0 - 17.0 g/dL   HCT 16.1 (*) 09.6 -  52.0 %   MCV 87.8   78.0 - 100.0 fL   MCH 30.2  26.0 - 34.0 pg   MCHC 34.4  30.0 - 36.0 g/dL   RDW 16.1 (*) 09.6 - 04.5 %   Platelets 538 (*) 150 - 400 K/uL  POCT I-STAT, CHEM 8      Result Value Range   Sodium 142  135 - 145 mEq/L   Potassium 3.4 (*) 3.5 - 5.1 mEq/L   Chloride 110  96 - 112 mEq/L   BUN 4 (*) 6 - 23 mg/dL   Creatinine, Ser 4.09 (*) 0.50 - 1.35 mg/dL   Glucose, Bld 811 (*) 70 - 99 mg/dL   Calcium, Ion 9.14  7.82 - 1.23 mmol/L   TCO2 22  0 - 100 mmol/L   Hemoglobin 8.5 (*) 13.0 - 17.0 g/dL   HCT 95.6 (*) 21.3 - 08.6 %   Dg Chest 2 View  06/20/2012   *RADIOLOGY REPORT*  Clinical Data: Shortness of breath.  CHEST - 2 VIEW  Comparison: 05/29/2012.  Findings: The power port is stable.  The heart is borderline enlarged but unchanged.  The mediastinal and hilar contours are stable.  There are chronic bronchitic type interstitial lung changes but no infiltrates, edema or effusions.  The bony thorax is intact.  IMPRESSION:  Chronic bronchitic type interstitial lung changes but no acute overlying pulmonary process.   Original Report Authenticated By: Rudie Meyer, M.D.   Ct Angio Chest Pe W/cm &/or Wo Cm  07/09/2012   *RADIOLOGY REPORT*  Clinical Data:  Pleuritic left chest pain beginning today, cough, question pulmonary embolism; history sickle cell crisis, pulmonary embolism, hypertension  CT ANGIOGRAPHY CHEST  Technique:  Multidetector CT imaging of the chest using the standard protocol during bolus administration of intravenous contrast. Multiplanar reconstructed images including MIPs were obtained and reviewed to evaluate the vascular anatomy.  Contrast: OMNIPAQUE IOHEXOL 350 MG/ML SOLN  Comparison: 02/20/2012  Findings: Diffuse thyroid enlargement. Left subclavian Port-A-Cath with tip within the right atrium. Aorta normal caliber without aneurysm or dissection. Small filling defects identified within bilateral upper lobe pulmonary arteries consistent with new pulmonary emboli. Filling defects in  right lower lobe pulmonary artery similar to previous exam. Filling defects identified left lower lobe pulmonary arteries, new.  Mildly enlarged right hilar lymph node 19 x 16 mm image 45. Multiple normal upper normal-sized anterior mediastinal nodes again seen. Enlarged azygo esophageal recess node 2.2 x 2.0 cm image 52. Small calcified spleen. Visualized portion of liver appears dense, can be seen with multiple etiologies but in a sickle cell patient who has received prior transfusions question iron overload or hemochromatosis.  Heart appears enlarged. Scattered areas of parenchymal lung scarring with bibasilar atelectasis as well as peripheral triangular density in the left upper lobe question related to pulmonary embolus. No definite acute infiltrate pleural effusion or pneumothorax. 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  IMPRESSION: New pulmonary emboli identified in bilateral upper lobe and left lower lobe pulmonary arteries. Enlargement of cardiac silhouette consistent with sickle cell disease. Scattered areas of atelectasis with triangular peripheral opacity in the left upper lobe which could be related to a pulmonary embolus. Dense liver question hemochromatosis versus iron overload. Nonspecific enlarged right hilar and azygo esophageal recess lymph nodes.  Critical Value/emergent results were called by telephone at the time of interpretation on 07/09/2012 at 1423 hours to Dr. Ethelda Chick, who verbally acknowledged these results.  Original Report Authenticated By: Ulyses Southward, M.D.    40 5 PM pain not improved after treatment with intravenous hydromorphone.Additional hydromorphone ordered On further history patient admits to noncompliance with his xarelto CT scan result discussed with radiologist MDM  Patient is moderate to high risk for pulmonary embolism in light of history of pulmonary embolism. CT angiogram  ordered Plan Lovenox, pain control  Spoke with Dr.Dhungel who will arrange for 23 hour observation, MedSurg floor Diagnosis acute pulmonary embolism #2 anemia #3 thrombocytosis      Doug Sou, MD 07/09/12 1455  Doug Sou, MD 07/09/12 [phone]

## 2012-07-09 NOTE — ED Notes (Signed)
Report called  

## 2012-07-09 NOTE — H&P (Addendum)
[facility] History and Physical  [name] YNW:[id number] DOB: 1980/04/22 DOA: 07/09/2012  Referring physician: Dr Ethelda Chick PCP: [name],[name], MD   Chief Complaint: left sided chest pain x 1 day  HPI:  32 year old male with sickle cell disease, history of PE on chronic anticoagulation, hypertension whopresented to the ED with left-sided chest pain since one day. Patient informs waking up with  left-sided chest pain which was nonradiating and sharp in nature. Chest pain worsens with deep inspiration. He informs having some shortness of breath as well. Denies any cough or hemoptysis. Patient thought his symptoms were related to sickle cell crisis as he had similar chest pain symptoms during those attacks and came to the ED. In the ED he was tachypneic but otherwise vitals stable. The lab work showed leukocytosis and chronic anemia. A CT angiogram of the chest done given history of PE showed no pulmonary emboli over bilateral upper lobe and left lower lobe . Patient reports that he recently went out of town and missed almost 10 days off he is xarelto dose. Was previously treated with Coumadin and recently switched to xarelto about 6 weeks back given issue with frequnet blood draws.   Review of Systems:  Constitutional: Denies fever, chills, diaphoresis, appetite change . Fatigue present.  HEENT: Denies photophobia, eye pain, redness, hearing loss, ear pain, congestion, sore throat, rhinorrhea, sneezing, mouth sores, trouble swallowing, neck pain, neck stiffness and tinnitus.   Respiratory: Denies SOB, DOE, chest tightness, Denies cough  and wheezing.   Cardiovascular: chest pain, Denies palpitations and leg swelling.  Gastrointestinal: Denies nausea, vomiting, abdominal pain, diarrhea, constipation, blood in stool and abdominal distention.  Genitourinary: Denies dysuria, urgency, frequency, hematuria, flank pain and difficulty urinating. . Musculoskeletal: Denies myalgias,  back pain, joint swelling, arthralgias and gait problem.  Skin: Denies pallor, rash and wound.  Neurological: Denies dizziness, seizures, syncope, weakness, light-headedness, numbness and headaches.  Hematological: Denies adenopathy. Easy bruising present Psychiatric/Behavioral: Denies mood changes, confusion, nervousness, sleep disturbance and agitation   Past Medical History  Diagnosis Date  . Sickle cell anemia   . Blood transfusion   . 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 Hypokalemia   . Mood disorder   . Pulmonary embolism   . Avascular necrosis   . Leukocytosis     Chronic  . Thrombocytosis     Chronic  . Hypertension    Past Surgical History  Procedure Laterality Date  . Right hip replacement      08/2006  . Cholecystectomy      01/2008  . Porta cath placement    . Porta cath removal    . Umbilical hernia repair      01/2008  . Excision of left periauricular cyst      10/2009  . Excision of right ear lobe cyst with primary closur      11/2007  . Portacath placement  01/05/2012    Procedure: INSERTION PORT-A-CATH;  Surgeon: Adolph Pollack, MD;  Location: [facility] OR;  Service: General;  Laterality: N/A;  ultrasound guiced port a cath insertion with fluoroscopy   Social History:  reports that he quit smoking about 2 years ago. His smoking use included Cigarettes. He smoked 0.00 packs per day for 13 years. He has never used smokeless tobacco. He reports that he does not drink alcohol or use illicit drugs.  Allergies  Allergen Reactions  . Morphine And Related Hives and Rash    Pt states he is  able to tolerate Dilaudid with no reactions.    Family History  Problem Relation Age of Onset  . Sickle cell anemia Mother   . Sickle cell anemia Father   . Sickle cell trait Brother     Prior to Admission medications   Medication Sig Start Date End Date Taking? Authorizing Provider  folic acid (FOLVITE) 1 MG tablet Take 1 tablet (1 mg total) by  mouth every morning. 06/05/12  Yes Altha Harm, MD  HYDROmorphone (DILAUDID) 4 MG tablet Take 1 tablet (4 mg total) by mouth every 4 (four) hours as needed for pain. 06/29/12  Yes Debby Crosley, MD  hydroxyurea (HYDREA) 500 MG capsule Take 1 capsule (500 mg total) by mouth 2 (two) times daily. May take with food to minimize GI side effects. 05/02/12  Yes Rometta Emery, MD  morphine (MSIR) 15 MG tablet Take 15 mg by mouth 2 (two) times daily as needed for pain.   Yes Historical Provider, MD  potassium chloride SA (K-DUR,KLOR-CON) 20 MEQ tablet Take 20 mEq by mouth every morning.  01/04/12  Yes Gwenyth Bender, MD  Rivaroxaban (XARELTO) 20 MG TABS Take 20 mg by mouth every morning. 06/12/12  Yes Altha Harm, MD  zolpidem (AMBIEN) 10 MG tablet Take 1 tablet (10 mg total) by mouth at bedtime as needed. For sleep 04/14/12  Yes Alinda Money, MD    Physical Exam:  Filed Vitals:   07/09/12 1315 07/09/12 1445 07/09/12 1503 07/09/12 1631  BP: 115/82  116/69 111/69  Pulse: 80 78 80 77  Temp:    98.7 F (37.1 C)  TempSrc:    Oral  Resp: 24 22 27 22   Height:    6' (1.829 m)  Weight:    79.379 kg (175 lb)  SpO2: 94% 92% 95% 97%    Constitutional: Vital signs reviewed.  Patient is a well-developed and well-nourished in no acute distress and cooperative with exam. Alert and oriented x3.  HEENT: No pallor, icterus present, moist oral mucosa Cardiovascular: RRR, S1 normal, S2 normal, no MRG, pulses symmetric and intact bilaterally Pulmonary/Chest: right sided port A cath, CTAB, no wheezes, rales, or rhonchi, reproducible left chest pain to palpation. Abdominal: Soft. Non-tender, non-distended, bowel sounds are normal, no masses, organomegaly, or guarding present.  GU: no CVA tenderness Musculoskeletal: No joint deformities, erythema, or stiffness, ROM full and no nontender Ext: no edema and no cyanosis, pulses palpable bilaterally  Hematology: no cervical, inginal, or axillary adenopathy.   Neurological: A&O x3, focal Skin: Warm, dry and intact. No rash, cyanosis, or clubbing.  Psychiatric: Normal mood and affect. speech and behavior is normal. Judgment and thought content normal. Cognition and memory are normal.   Labs on Admission:  Basic Metabolic Panel:  Recent Labs Lab 07/09/12 1248  NA 142  K 3.4*  CL 110  GLUCOSE 101*  BUN 4*  CREATININE 0.40*   Liver Function Tests:  Recent Labs Lab 07/09/12 1230  AST 26  ALT 16  ALKPHOS 79  BILITOT 6.1*  PROT 7.4  ALBUMIN 3.9   No results found for this basename: LIPASE, AMYLASE,  in the last 168 hours No results found for this basename: AMMONIA,  in the last 168 hours CBC:  Recent Labs Lab 07/09/12 1230 07/09/12 1248  WBC 21.4*  --   HGB 7.7* 8.5*  HCT 22.4* 25.0*  MCV 87.8  --   PLT 538*  --    Cardiac Enzymes: No results found for this basename:  CKTOTAL, CKMB, CKMBINDEX, TROPONINI,  in the last 168 hours BNP: No components found with this basename: POCBNP,  CBG: No results found for this basename: GLUCAP,  in the last 168 hours  Radiological Exams on Admission: Ct Angio Chest Pe W/cm &/or Wo Cm  07/09/2012   *RADIOLOGY REPORT*  Clinical Data:  Pleuritic left chest pain beginning today, cough, question pulmonary embolism; history sickle cell crisis, pulmonary embolism, hypertension  CT ANGIOGRAPHY CHEST  Technique:  Multidetector CT imaging of the chest using the standard protocol during bolus administration of intravenous contrast. Multiplanar reconstructed images including MIPs were obtained and reviewed to evaluate the vascular anatomy.  Contrast: OMNIPAQUE IOHEXOL 350 MG/ML SOLN  Comparison: 02/20/2012  Findings: Diffuse thyroid enlargement. Left subclavian Port-A-Cath with tip within the right atrium. Aorta normal caliber without aneurysm or dissection. Small filling defects identified within bilateral upper lobe pulmonary arteries consistent with new pulmonary emboli. Filling defects in right  lower lobe pulmonary artery similar to previous exam. Filling defects identified left lower lobe pulmonary arteries, new.  Mildly enlarged right hilar lymph node 19 x 16 mm image 45. Multiple normal upper normal-sized anterior mediastinal nodes again seen. Enlarged azygo esophageal recess node 2.2 x 2.0 cm image 52. Small calcified spleen. Visualized portion of liver appears dense, can be seen with multiple etiologies but in a sickle cell patient who has received prior transfusions question iron overload or hemochromatosis.  Heart appears enlarged. Scattered areas of parenchymal lung scarring with bibasilar atelectasis as well as peripheral triangular density in the left upper lobe question related to pulmonary embolus. No definite acute infiltrate pleural effusion or pneumothorax. 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  IMPRESSION: New pulmonary emboli identified in bilateral upper lobe and left lower lobe pulmonary arteries. Enlargement of cardiac silhouette consistent with sickle cell disease. Scattered areas of atelectasis with triangular peripheral opacity in the left upper lobe which could be related to a pulmonary embolus. Dense liver question hemochromatosis versus iron overload. Nonspecific enlarged right hilar and azygo esophageal recess lymph nodes.  Critical Value/emergent results were called by telephone at the time of interpretation on 07/09/2012 at 1423 hours to Dr. Ethelda Chick, who verbally acknowledged these results.   Original Report Authenticated By: Ulyses Southward, M.D.    EKG:NSR no ST -changes  Assessment/Plan  Principal Problem:   Acute pulmonary embolism Likely in the setting of missing his xarelto for almost 10 days recently and would not consider this as failed treatment or resistance. Admit to obs tele. IV fluids. o2 via [facility] consult to redose xarelto.  check LE doppler to r/o DVT and clot  burden if present  Active Problems:   Chest pain Secondary to acute PE and associated vaso occlusive crisis. Pain control with dilaudid IV q 2 hr. Continue  MSCR Q12 hr. contineu folate and hydroxyurea  Sickle cell disease As outlined above. Follows with sickle cell team    Hypokalemia Replenish with kcl  Leucocytosis  likely stress induced. monitor in am     Diet: regular  Code Status:  Full code Family Communication: None at bedside Disposition Plan: home once symptoms resolved  Eddie North [facility] Pager [phone]  If 7PM-7AM, please contact night-coverage [email or web address] Password [facility] 07/09/2012, 5:26 PM  Total time spent: 55 minutes

## 2012-07-09 NOTE — Progress Notes (Addendum)
ANTICOAGULATION CONSULT NOTE - Initial Consult  Pharmacy Consult for Lovenox/warfarin Indication: pulmonary embolus  Allergies  Allergen Reactions  . Morphine And Related Hives and Rash    Pt states he is able to tolerate Dilaudid with no reactions.    Patient Measurements:   Heparin Dosing Weight: 79.9 kg as of 06/27/12  Vital Signs: Temp: 98.8 F (37.1 C) (06/09 1216) Temp src: Oral (06/09 1216) BP: 116/69 mmHg (06/09 1503) Pulse Rate: 80 (06/09 1503)  Labs:  Recent Labs  07/09/12 1230 07/09/12 1248  HGB 7.7* 8.5*  HCT 22.4* 25.0*  PLT 538*  --   CREATININE  --  0.40*    The CrCl is unknown because both a height and weight (above a minimum accepted value) are required for this calculation.   Medical History: Past Medical History  Diagnosis Date  . Sickle cell anemia   . Blood transfusion   . 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 Hypokalemia   . Mood disorder   . Pulmonary embolism   . Avascular necrosis   . Leukocytosis     Chronic  . Thrombocytosis     Chronic  . Hypertension      Assessment: 38 yoM presenting with chest pain.  CT Angio shows new pulmonary emboli identified in bilateral upper lobe and left lower lobe pulmonary arteries.  Patient will be admitted for 23 hours observation and will begin Lovenox for treatment of acute PE per pharmacy dosing.  PMH significant for sickle cell anemia and on chronic anticoagulation with Xarelto for history of PE.    Patient reports taking Xarelto 20 mg dose at ~2200 last night (07/08/12).    SCr low at 0.40.   Hgb low at 8.5 secondary to sickle cell disease.  Platelets 538K.  Goal of Therapy:  Anti-Xa level 0.6-1.2 units/ml 4hrs after LMWH dose given Monitor platelets by anticoagulation protocol: Yes   Plan:  1.  Lovenox 80 mg (1 mg/kg) SQ q12h.  Start Lovenox ~24 hours following last dose of Xarelto.  Will schedule first Lovenox dose at 2200 tonight. 2.  F/u CBC.  Clance Boll 07/09/2012,3:38 PM   Addendum: 07/09/2012 4:46 PM New order for pharmacy to begin warfarin dosing. Of note, patient has taken warfarin in the past for PE history.   Plan:   1.  Obtain baseline INR; however, the INR would most likely be falsely elevated due to Xarelto therapy so may be difficult to interpret. 2.  Warfarin 10 mg PO once with start of Lovenox at 2200 tonight.   3.  Warfarin education book and video.  Education counseling by pharmacist to follow. 4.  Daily INR.  Clance Boll, PharmD, BCPS Pager: [phone] 07/09/2012 4:53 PM

## 2012-07-10 DIAGNOSIS — D72829 Elevated white blood cell count, unspecified: Secondary | ICD-10-CM

## 2012-07-10 DIAGNOSIS — D5700 Hb-SS disease with crisis, unspecified: Secondary | ICD-10-CM

## 2012-07-10 DIAGNOSIS — I2699 Other pulmonary embolism without acute cor pulmonale: Principal | ICD-10-CM

## 2012-07-10 DIAGNOSIS — R071 Chest pain on breathing: Secondary | ICD-10-CM

## 2012-07-10 LAB — CBC
Hemoglobin: 7.2 g/dL — ABNORMAL LOW (ref 13.0–17.0)
MCH: 30 pg (ref 26.0–34.0)
RBC: 2.4 MIL/uL — ABNORMAL LOW (ref 4.22–5.81)
WBC: 22.6 10*3/uL — ABNORMAL HIGH (ref 4.0–10.5)

## 2012-07-10 LAB — BASIC METABOLIC PANEL
CO2: 23 mEq/L (ref 19–32)
Calcium: 8.7 mg/dL (ref 8.4–10.5)
Chloride: 105 mEq/L (ref 96–112)
Glucose, Bld: 106 mg/dL — ABNORMAL HIGH (ref 70–99)
Potassium: 3.5 mEq/L (ref 3.5–5.1)
Sodium: 138 mEq/L (ref 135–145)

## 2012-07-10 MED ORDER — HYDROMORPHONE HCL PF 2 MG/ML IJ SOLN
3.0000 mg | INTRAMUSCULAR | Status: DC | PRN
  Administered 2012-07-10: 3 mg via INTRAVENOUS
  Filled 2012-07-10: qty 2

## 2012-07-10 MED ORDER — KETOROLAC TROMETHAMINE 30 MG/ML IJ SOLN
30.0000 mg | Freq: Once | INTRAMUSCULAR | Status: AC
  Administered 2012-07-10: 30 mg via INTRAVENOUS
  Filled 2012-07-10: qty 1

## 2012-07-10 MED ORDER — HYDROMORPHONE HCL PF 2 MG/ML IJ SOLN
2.0000 mg | Freq: Once | INTRAMUSCULAR | Status: AC
  Administered 2012-07-10: 2 mg via INTRAVENOUS

## 2012-07-10 MED ORDER — HYDROMORPHONE HCL PF 2 MG/ML IJ SOLN: 3.0000 mg | INTRAMUSCULAR | Status: DC | PRN

## 2012-07-10 MED ORDER — KETOROLAC TROMETHAMINE 30 MG/ML IJ SOLN
30.0000 mg | Freq: Four times a day (QID) | INTRAMUSCULAR | Status: DC
  Administered 2012-07-10 – 2012-07-13 (×10): 30 mg via INTRAVENOUS
  Filled 2012-07-10 (×20): qty 1

## 2012-07-10 MED ORDER — HYDROMORPHONE HCL PF 1 MG/ML IJ SOLN
3.0000 mg | INTRAMUSCULAR | Status: DC | PRN
  Administered 2012-07-10 – 2012-07-12 (×22): 3 mg via INTRAVENOUS
  Filled 2012-07-10 (×22): qty 3

## 2012-07-10 NOTE — Consult Note (Signed)
PULMONARY  / CRITICAL CARE MEDICINE  Name: [name] MRN: [id number] DOB: 1980/02/18    ADMISSION DATE:  07/09/2012 CONSULTATION DATE: 6-10  REFERRING MD :  [facility]IMARY SERVICE:  [facility]  CHIEF COMPLAINT:  Left sided chest pain.    SIGNIFICANT EVENTS / STUDIES:  6-9 ct + recurrent PE  LINES / TUBES:   CULTURES:   ANTIBIOTICS:   HISTORY OF PRESENT ILLNESS:   32 yo AAM with known sicle cell disease and history of PE along with non compliance of anticoagulation medications. He presented 6-9 with less than 24 hours of sharp left sided chest pain that increased with deep breath. No reports of hemoptysis. Of note he had missed 10 days of Xarelto for which he takes for history of PE.Recurret PE was diagnosed in jan 2014 in [facility]. His firs episode of PE was 2 yrs ago   He further had failed coumadin due to frequent blood checks. PCCM asked to evaluate to advise on anticoagulation and focus on cardiac manifestations of chronic recurrent PE's complicated by Sickle Cell disease.  He frankly states that he went out of town & forgot to take his bag of medication. He states he understands the seriousness of his condition (PE). From his prior experience with arfarin, he prefers xarelto since he does not need INR checks. He understands that xarelto does not have a reversal agent as of now  PAST MEDICAL HISTORY :  Past Medical History  Diagnosis Date  . Sickle cell anemia   . Blood transfusion   . 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 Hypokalemia   . Mood disorder   . Pulmonary embolism   . Avascular necrosis   . Leukocytosis     Chronic  . Thrombocytosis     Chronic  . Hypertension    Past Surgical History  Procedure Laterality Date  . Right hip replacement      08/2006  . Cholecystectomy      01/2008  . Porta cath placement    . Porta cath removal    . Umbilical hernia repair      01/2008  . Excision of left periauricular cyst      10/2009   . Excision of right ear lobe cyst with primary closur      11/2007  . Portacath placement  01/05/2012    Procedure: INSERTION PORT-A-CATH;  Surgeon: Adolph Pollack, MD;  Location: [facility] OR;  Service: General;  Laterality: N/A;  ultrasound guiced port a cath insertion with fluoroscopy   Prior to Admission medications   Medication Sig Start Date End Date Taking? Authorizing Provider  folic acid (FOLVITE) 1 MG tablet Take 1 tablet (1 mg total) by mouth every morning. 06/05/12  Yes Altha Harm, MD  HYDROmorphone (DILAUDID) 4 MG tablet Take 1 tablet (4 mg total) by mouth every 4 (four) hours as needed for pain. 06/29/12  Yes Debby Crosley, MD  hydroxyurea (HYDREA) 500 MG capsule Take 1 capsule (500 mg total) by mouth 2 (two) times daily. May take with food to minimize GI side effects. 05/02/12  Yes Rometta Emery, MD  morphine (MSIR) 15 MG tablet Take 15 mg by mouth 2 (two) times daily as needed for pain.   Yes Historical Provider, MD  potassium chloride SA (K-DUR,KLOR-CON) 20 MEQ tablet Take 20 mEq by mouth every morning.  01/04/12  Yes Gwenyth Bender, MD  Rivaroxaban (XARELTO) 20 MG TABS Take 20 mg by mouth  every morning. 06/12/12  Yes Altha Harm, MD  zolpidem (AMBIEN) 10 MG tablet Take 1 tablet (10 mg total) by mouth at bedtime as needed. For sleep 04/14/12  Yes Alinda Money, MD   Allergies  Allergen Reactions  . Morphine And Related Hives and Rash    Pt states he is able to tolerate Dilaudid with no reactions.    FAMILY HISTORY:  Family History  Problem Relation Age of Onset  . Sickle cell anemia Mother   . Sickle cell anemia Father   . Sickle cell trait Brother    SOCIAL HISTORY:  reports that he quit smoking about 2 years ago. His smoking use included Cigarettes. He smoked 0.00 packs per day for 13 years. He has never used smokeless tobacco. He reports that he does not drink alcohol or use illicit drugs.  REVIEW OF SYSTEMS:   10 point review of system taken, please see HPI  for positives and negatives. neg for any significant sore throat, dysphagia, itching, sneezing, nasal congestion or excess/ purulent secretions, fever, chills, sweats, unintended wt loss,  hempoptysis, orthopnea pnd or change in chronic leg swelling. Also denies presyncope, palpitations, heartburn, abdominal pain, nausea, vomiting, diarrhea or change in bowel or urinary habits, dysuria,hematuria, rash, arthralgias, visual complaints, headache, numbness weakness or ataxia.  SUBJECTIVE: c/o pain over left chest, 2mg  dilaudid not enough  VITAL SIGNS: Temp:  [98.1 F (36.7 C)-98.8 F (37.1 C)] 98.8 F (37.1 C) (06/10 0521) Pulse Rate:  [77-98] 98 (06/10 0521) Resp:  [20-27] 20 (06/10 0521) BP: (111-123)/(69-83) 123/83 mmHg (06/10 0521) SpO2:  [92 %-97 %] 92 % (06/10 0521) Weight:  [79.379 kg (175 lb)] 79.379 kg (175 lb) (06/09 1631)  PHYSICAL EXAMINATION: Gen. Pleasant, well-nourished, in no distress, normal affect ENT - no lesions, no post nasal drip, icteric Neck: No JVD, no thyromegaly, no carotid bruits Lungs: no use of accessory muscles, no dullness to percussion, clear without rales or rhonchi  Cardiovascular: Rhythm regular, heart sounds  normal, no murmurs, no peripheral edema Abdomen: soft and non-tender, no hepatosplenomegaly, BS normal. Musculoskeletal: No deformities, no cyanosis or clubbing Neuro:  alert, non focal Skin:  Warm, no lesions/ rash    Recent Labs Lab 07/09/12 1248 07/10/12 0558  NA 142 138  K 3.4* 3.5  CL 110 105  CO2  --  23  BUN 4* 6  CREATININE 0.40* 0.63  GLUCOSE 101* 106*    Recent Labs Lab 07/09/12 1230 07/09/12 1248 07/10/12 0558  HGB 7.7* 8.5* 7.2*  HCT 22.4* 25.0* 20.8*  WBC 21.4*  --  22.6*  PLT 538*  --  489*   Ct Angio Chest Pe W/cm &/or Wo Cm  07/09/2012   *RADIOLOGY REPORT*  Clinical Data:  Pleuritic left chest pain beginning today, cough, question pulmonary embolism; history sickle cell crisis, pulmonary embolism, hypertension   CT ANGIOGRAPHY CHEST  Technique:  Multidetector CT imaging of the chest using the standard protocol during bolus administration of intravenous contrast. Multiplanar reconstructed images including MIPs were obtained and reviewed to evaluate the vascular anatomy.  Contrast: OMNIPAQUE IOHEXOL 350 MG/ML SOLN  Comparison: 02/20/2012  Findings: Diffuse thyroid enlargement. Left subclavian Port-A-Cath with tip within the right atrium. Aorta normal caliber without aneurysm or dissection. Small filling defects identified within bilateral upper lobe pulmonary arteries consistent with new pulmonary emboli. Filling defects in right lower lobe pulmonary artery similar to previous exam. Filling defects identified left lower lobe pulmonary arteries, new.  Mildly enlarged right hilar lymph node  19 x 16 mm image 45. Multiple normal upper normal-sized anterior mediastinal nodes again seen. Enlarged azygo esophageal recess node 2.2 x 2.0 cm image 52. Small calcified spleen. Visualized portion of liver appears dense, can be seen with multiple etiologies but in a sickle cell patient who has received prior transfusions question iron overload or hemochromatosis.  Heart appears enlarged. Scattered areas of parenchymal lung scarring with bibasilar atelectasis as well as peripheral triangular density in the left upper lobe question related to pulmonary embolus. No definite acute infiltrate pleural effusion or pneumothorax. 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  IMPRESSION: New pulmonary emboli identified in bilateral upper lobe and left lower lobe pulmonary arteries. Enlargement of cardiac silhouette consistent with sickle cell disease. Scattered areas of atelectasis with triangular peripheral opacity in the left upper lobe which could be related to a pulmonary embolus. Dense liver question hemochromatosis versus iron overload. Nonspecific enlarged  right hilar and azygo esophageal recess lymph nodes.  Critical Value/emergent results were called by telephone at the time of interpretation on 07/09/2012 at 1423 hours to Dr. Ethelda Chick, who verbally acknowledged these results.   Original Report Authenticated By: Ulyses Southward, M.D.    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 He does prefer xarelto to warfarin & would like to stay on this medication. As such, for his convenience & to encourage compliance, it would be ok to keep him on xarelto. This is certainly not a situation of anticoagulation failure or resistance. Echo can be checked in 6 wks to estimate RVSP again D/w Dr Jerolyn Center.  Cyril Mourning MD. Tonny Bollman. [facility] Pager [phone] If no response call [phone]

## 2012-07-10 NOTE — Progress Notes (Signed)
*  PRELIMINARY RESULTS* Vascular Ultrasound Lower extremity venous duplex has been completed.  Preliminary findings: Bilateral:  No evidence of DVT, superficial thrombosis, or Baker's Cyst.    Farrel Demark, RDMS, RVT  07/10/2012, 8:57 AM

## 2012-07-10 NOTE — Progress Notes (Signed)
SICKLE CELL SERVICE PROGRESS NOTE  [name] AVW:[id number] DOB: [birth year] DOA: 07/09/2012 PCP: Gerald Boyar A., MD  Assessment/Plan: Principal Problem:   Acute pulmonary embolism Active Problems:   Chronic anticoagulation   Sickle cell anemia   Hypokalemia   Sickle cell anemia with crisis   Chest pain  1. Acute PE: Pt presented with recurrent PE after he forgot his Xarelto at home when he went out of town for 1 week.He awoke with a sudden onset of pain in the left chest wall 2 nights ago and came to the ED for evalaution as it felt similar to when he was diagnosed wit PE in the past. I have spoken with Dr. Vassie Loll of pulmonology who recommends continuing with Xarelto. Pt will also need a repeat ECHO in 6-8 weeks to evaluate for pulmonary HTN. 2. Sickle Cell Crisis: Continue current medications and add  3. Hb SS: Pt has been doing well wit his disease management. 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  4. Leukocytosis: He has no signs of infection and my assessment is tha this is related to PE as well as acute crisis likely precipitated by the PE. Will continue with symptomatic management and monitor for signs of infection. Check urine. 5. Anemia: Pt has had  1 gram decrease in Hb. This actually places him at his baseline Hb. Will check bilirubin in the morning to evaluate for further hemolysis.  Code Status: Full Code Family Communication: N/A Disposition Plan: Home at time of discharge  [name] A.  Pager [phone]. If 7PM-7AM, please contact night-coverage.  07/10/2012, 5:58 PM  LOS: 1 day   Brief narrative: 32 year old male with sickle cell disease, history of PE on chronic anticoagulation, hypertension whopresented to the ED with left-sided chest pain since one day. Patient informs waking up with  left-sided chest pain which was nonradiating and sharp in nature. Chest pain worsens with deep  inspiration. He informs having some shortness of breath as well. Denies any cough or hemoptysis. Patient thought his symptoms were related to sickle cell crisis as he had similar chest pain symptoms during those attacks and came to the ED. In the ED he was tachypneic but otherwise vitals stable. The lab work showed leukocytosis and chronic anemia. A CT angiogram of the chest done given history of PE showed no pulmonary emboli over bilateral upper lobe and left lower lobe . Patient reports that he recently went out of town and missed almost 10 days off he is xarelto dose. Was previously treated with Coumadin and recently switched to xarelto about 6 weeks back given issue with noncompliance with monitoring INR.   Consultants:  [facility]  Procedures:  none  Antibiotics:  none  HPI/Subjective: Pt states that oain is localized to left side of chest wall and is 7-8/10. He has no other areas of pain and states that the pain is unlike his usual pain of sickle cell crisis.  Objective: Filed Vitals:   07/09/12 1631 07/09/12 2115 07/10/12 0521 07/10/12 1432  BP: 111/69 123/72 123/83 130/83  Pulse: 77 90 98 93  Temp: 98.7 F (37.1 C) 98.1 F (36.7 C) 98.8 F (37.1 C) 98.7 F (37.1 C)  TempSrc: Oral Oral Oral Oral  Resp: 22 20 20 18   Height: 6' (1.829 m)     Weight: 175 lb (79.379 kg)     SpO2: 97% 94% 92% 92%   Weight change:   Intake/Output Summary (  Last 24 hours) at 07/10/12 1758 Last data filed at 07/10/12 0749  Gross per 24 hour  Intake    480 ml  Output      0 ml  Net    480 ml    General: Alert, awake, oriented x3, in no acute distress.  HEENT: [place]/AT PEERL, EOMI, mild icterus Heart: Regular rate and rhythm, without murmurs, rubs, gallops.  Lungs: Clear to auscultation, no wheezing or rhonchi noted.  Abdomen: Soft, nontender, nondistended, positive bowel sounds, no masses no hepatosplenomegaly noted.  Neuro: No focal neurological deficits noted cranial nerves II  through XII grossly intact.  Strength normal in bilateral upper and lower extremities. Musculoskeletal: No warm swelling or erythema around joints, no spinal tenderness noted. Psychiatric: Patient alert and oriented x3, good insight and cognition, good recent to remote recall.    Data Reviewed: Basic Metabolic Panel:  Recent Labs Lab 07/09/12 1248 07/10/12 0558  NA 142 138  K 3.4* 3.5  CL 110 105  CO2  --  23  GLUCOSE 101* 106*  BUN 4* 6  CREATININE 0.40* 0.63  CALCIUM  --  8.7   Liver Function Tests:  Recent Labs Lab 07/09/12 1230  AST 26  ALT 16  ALKPHOS 79  BILITOT 6.1*  PROT 7.4  ALBUMIN 3.9   No results found for this basename: LIPASE, AMYLASE,  in the last 168 hours No results found for this basename: AMMONIA,  in the last 168 hours CBC:  Recent Labs Lab 07/09/12 1230 07/09/12 1248 07/10/12 0558  WBC 21.4*  --  22.6*  HGB 7.7* 8.5* 7.2*  HCT 22.4* 25.0* 20.8*  MCV 87.8  --  86.7  PLT 538*  --  489*   Cardiac Enzymes: No results found for this basename: CKTOTAL, CKMB, CKMBINDEX, TROPONINI,  in the last 168 hours BNP (last 3 results)  Recent Labs  07/18/11 1500  PROBNP 13.5   CBG: No results found for this basename: GLUCAP,  in the last 168 hours  No results found for this or any previous visit (from the past 240 hour(s)).   Studies: Dg Chest 2 View  06/20/2012   *RADIOLOGY REPORT*  Clinical Data: Shortness of breath.  CHEST - 2 VIEW  Comparison: 05/29/2012.  Findings: The power port is stable.  The heart is borderline enlarged but unchanged.  The mediastinal and hilar contours are stable.  There are chronic bronchitic type interstitial lung changes but no infiltrates, edema or effusions.  The bony thorax is intact.  IMPRESSION:  Chronic bronchitic type interstitial lung changes but no acute overlying pulmonary process.   Original Report Authenticated By: Rudie Meyer, M.D.   Ct Angio Chest Pe W/cm &/or Wo Cm  07/09/2012   *RADIOLOGY REPORT*   Clinical Data:  Pleuritic left chest pain beginning today, cough, question pulmonary embolism; history sickle cell crisis, pulmonary embolism, hypertension  CT ANGIOGRAPHY CHEST  Technique:  Multidetector CT imaging of the chest using the standard protocol during bolus administration of intravenous contrast. Multiplanar reconstructed images including MIPs were obtained and reviewed to evaluate the vascular anatomy.  Contrast: OMNIPAQUE IOHEXOL 350 MG/ML SOLN  Comparison: 02/20/2012  Findings: Diffuse thyroid enlargement. Left subclavian Port-A-Cath with tip within the right atrium. Aorta normal caliber without aneurysm or dissection. Small filling defects identified within bilateral upper lobe pulmonary arteries consistent with new pulmonary emboli. Filling defects in right lower lobe pulmonary artery similar to previous exam. Filling defects identified left lower lobe pulmonary arteries, new.  Mildly enlarged right  hilar lymph node 19 x 16 mm image 45. Multiple normal upper normal-sized anterior mediastinal nodes again seen. Enlarged azygo esophageal recess node 2.2 x 2.0 cm image 52. Small calcified spleen. Visualized portion of liver appears dense, can be seen with multiple etiologies but in a sickle cell patient who has received prior transfusions question iron overload or hemochromatosis.  Heart appears enlarged. Scattered areas of parenchymal lung scarring with bibasilar atelectasis as well as peripheral triangular density in the left upper lobe question related to pulmonary embolus. No definite acute infiltrate pleural effusion or pneumothorax. 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  IMPRESSION: New pulmonary emboli identified in bilateral upper lobe and left lower lobe pulmonary arteries. Enlargement of cardiac silhouette consistent with sickle cell disease. Scattered areas of atelectasis with triangular peripheral  opacity in the left upper lobe which could be related to a pulmonary embolus. Dense liver question hemochromatosis versus iron overload. Nonspecific enlarged right hilar and azygo esophageal recess lymph nodes.  Critical Value/emergent results were called by telephone at the time of interpretation on 07/09/2012 at 1423 hours to Dr. Ethelda Chick, who verbally acknowledged these results.   Original Report Authenticated By: Ulyses Southward, M.D.    Scheduled Meds: . folic acid  1 mg Oral q morning - 10a  . hydroxyurea  500 mg Oral BID  . ketorolac  30 mg Intravenous Q6H  . morphine  15 mg Oral BID PC  . potassium chloride SA  20 mEq Oral q morning - 10a  . [START ON 07/31/2012] rivaroxaban  20 mg Oral Q supper  . rivaroxaban  15 mg Oral BID WC  . sodium chloride  3 mL Intravenous Q12H   Continuous Infusions: . sodium chloride 100 mL/hr at 07/10/12 1044    Principal Problem:   Acute pulmonary embolism Active Problems:   Chronic anticoagulation   Sickle cell anemia   Hypokalemia   Sickle cell anemia with crisis   Chest pain

## 2012-07-11 LAB — CBC WITH DIFFERENTIAL/PLATELET
Basophils Relative: 2 % — ABNORMAL HIGH (ref 0–1)
Eosinophils Relative: 6 % — ABNORMAL HIGH (ref 0–5)
HCT: 21.2 % — ABNORMAL LOW (ref 39.0–52.0)
Hemoglobin: 7.5 g/dL — ABNORMAL LOW (ref 13.0–17.0)
Lymphs Abs: 4.5 10*3/uL — ABNORMAL HIGH (ref 0.7–4.0)
MCH: 30.9 pg (ref 26.0–34.0)
MCV: 87.2 fL (ref 78.0–100.0)
Monocytes Absolute: 3.5 10*3/uL — ABNORMAL HIGH (ref 0.1–1.0)
Monocytes Relative: 15 % — ABNORMAL HIGH (ref 3–12)
Neutro Abs: 13.6 10*3/uL — ABNORMAL HIGH (ref 1.7–7.7)
RBC: 2.43 MIL/uL — ABNORMAL LOW (ref 4.22–5.81)
WBC: 23.5 10*3/uL — ABNORMAL HIGH (ref 4.0–10.5)

## 2012-07-11 LAB — COMPREHENSIVE METABOLIC PANEL
Albumin: 4.2 g/dL (ref 3.5–5.2)
Alkaline Phosphatase: 90 U/L (ref 39–117)
BUN: 6 mg/dL (ref 6–23)
CO2: 22 mEq/L (ref 19–32)
Chloride: 107 mEq/L (ref 96–112)
Creatinine, Ser: 0.55 mg/dL (ref 0.50–1.35)
GFR calc non Af Amer: >90 mL/min (ref >90)
Potassium: 3.8 mEq/L (ref 3.5–5.1)
Total Bilirubin: 7.7 mg/dL — ABNORMAL HIGH (ref 0.3–1.2)

## 2012-07-11 MED ORDER — MORPHINE SULFATE 15 MG PO TABS
15.0000 mg | ORAL_TABLET | Freq: Three times a day (TID) | ORAL | Status: DC
  Administered 2012-07-11 (×2): 15 mg via ORAL
  Filled 2012-07-11 (×2): qty 1

## 2012-07-11 MED ORDER — MORPHINE SULFATE 15 MG PO TABS: 15.0000 mg | ORAL_TABLET | Freq: Three times a day (TID) | ORAL | Status: DC

## 2012-07-11 NOTE — Progress Notes (Signed)
Subjective: A 32 yo man with sickle cell disease and history of thromboembolic disease here with bilateral PE after missing his xarelto doses for a number of days. Patient is complaining of 8/10 pain involving his chest and lower back. No shortness of breath, no cough, no fever. He feels like knife-sharp pain. He is on both Dilaudid and MSIR. Not getting prolonged pain relief and wants increase on his Dilaudid dose.  Objective: Vital signs in last 24 hours: Temp:  [98.7 F (37.1 C)-99 F (37.2 C)] 98.7 F (37.1 C) (06/11 0700) Pulse Rate:  [93-99] 99 (06/11 0700) Resp:  [18-20] 20 (06/11 0600) BP: (130-135)/(73-88) 133/73 mmHg (06/11 0700) SpO2:  [92 %-94 %] 93 % (06/11 0700) Weight change:  Last BM Date: 07/09/12  Intake/Output from previous day: 06/10 0701 - 06/11 0700 In: 240 [P.O.:240] Out: -  Intake/Output this shift:    General appearance: alert, appears stated age and no distress Ears: normal TM's and external ear canals both ears Neck: no adenopathy, no carotid bruit, no JVD, supple, symmetrical, trachea midline and thyroid not enlarged, symmetric, no tenderness/mass/nodules Resp: clear to auscultation bilaterally Chest wall: right sided chest wall tenderness, left sided chest wall tenderness Cardio: regular rate and rhythm, S1, S2 normal, no murmur, click, rub or gallop GI: soft, non-tender; bowel sounds normal; no masses,  no organomegaly Extremities: extremities normal, atraumatic, no cyanosis or edema Pulses: 2+ and symmetric Skin: Skin color, texture, turgor normal. No rashes or lesions Neurologic: Grossly normal  Lab Results:  Recent Labs  07/10/12 0558 07/11/12 0431  WBC 22.6* 23.5*  HGB 7.2* 7.5*  HCT 20.8* 21.2*  PLT 489* 454*   BMET  Recent Labs  07/10/12 0558 07/11/12 0431  NA 138 139  K 3.5 3.8  CL 105 107  CO2 23 22  GLUCOSE 106* 122*  BUN 6 6  CREATININE 0.63 0.55  CALCIUM 8.7 9.2    Studies/Results: Ct Angio Chest Pe W/cm &/or Wo  Cm  07/09/2012   *RADIOLOGY REPORT*  Clinical Data:  Pleuritic left chest pain beginning today, cough, question pulmonary embolism; history sickle cell crisis, pulmonary embolism, hypertension  CT ANGIOGRAPHY CHEST  Technique:  Multidetector CT imaging of the chest using the standard protocol during bolus administration of intravenous contrast. Multiplanar reconstructed images including MIPs were obtained and reviewed to evaluate the vascular anatomy.  Contrast: OMNIPAQUE IOHEXOL 350 MG/ML SOLN  Comparison: 02/20/2012  Findings: Diffuse thyroid enlargement. Left subclavian Port-A-Cath with tip within the right atrium. Aorta normal caliber without aneurysm or dissection. Small filling defects identified within bilateral upper lobe pulmonary arteries consistent with new pulmonary emboli. Filling defects in right lower lobe pulmonary artery similar to previous exam. Filling defects identified left lower lobe pulmonary arteries, new.  Mildly enlarged right hilar lymph node 19 x 16 mm image 45. Multiple normal upper normal-sized anterior mediastinal nodes again seen. Enlarged azygo esophageal recess node 2.2 x 2.0 cm image 52. Small calcified spleen. Visualized portion of liver appears dense, can be seen with multiple etiologies but in a sickle cell patient who has received prior transfusions question iron overload or hemochromatosis.  Heart appears enlarged. Scattered areas of parenchymal lung scarring with bibasilar atelectasis as well as peripheral triangular density in the left upper lobe question related to pulmonary embolus. No definite acute infiltrate pleural effusion or pneumothorax. 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  IMPRESSION: New pulmonary emboli identified in bilateral upper lobe  and left lower lobe pulmonary arteries. Enlargement of cardiac silhouette consistent with sickle cell disease. Scattered areas of  atelectasis with triangular peripheral opacity in the left upper lobe which could be related to a pulmonary embolus. Dense liver question hemochromatosis versus iron overload. Nonspecific enlarged right hilar and azygo esophageal recess lymph nodes.  Critical Value/emergent results were called by telephone at the time of interpretation on 07/09/2012 at 1423 hours to Dr. Ethelda Chick, who verbally acknowledged these results.   Original Report Authenticated By: Ulyses Southward, M.D.    Medications: I have reviewed the patient's current medications.  Assessment/Plan: A 32 yo with sickle cell disease and bilateral pulmonary embolism.  #1 Bilateral ZO:XWRUEAV is back on his Xarelto now. He has been counseled extensively on medication compliance. The use of Xarelto for him is supposed to make him more compliant since he can avoid frequent laboratory tests and monitoring visits.  #2 Sickle Cell Disease:Patient has both acute and chronic pain issues. He is on 3mg  of Dilaudid IV every 3 hours. He is not on long acting morphine. I will switch him to long acting morphine with the PRN Dilaudid for breakthrough pain. He can have short acting morphine added at DC. No change in Dilaudid dose today.  #3 Hypokalemia: repleted    LOS: 2 days   Ural Acree,[name] 07/11/2012, 11:16 AM

## 2012-07-12 ENCOUNTER — Other Ambulatory Visit: Payer: Self-pay | Admitting: Internal Medicine

## 2012-07-12 DIAGNOSIS — D571 Sickle-cell disease without crisis: Secondary | ICD-10-CM

## 2012-07-12 DIAGNOSIS — G4701 Insomnia due to medical condition: Secondary | ICD-10-CM

## 2012-07-12 LAB — CBC WITH DIFFERENTIAL/PLATELET
Basophils Absolute: 0.4 10*3/uL — ABNORMAL HIGH (ref 0.0–0.1)
Eosinophils Relative: 7 % — ABNORMAL HIGH (ref 0–5)
Lymphs Abs: 3.7 10*3/uL (ref 0.7–4.0)
MCH: 29.6 pg (ref 26.0–34.0)
MCV: 88 fL (ref 78.0–100.0)
Monocytes Absolute: 2.5 10*3/uL — ABNORMAL HIGH (ref 0.1–1.0)
Neutrophils Relative %: 59 % (ref 43–77)
Platelets: 463 10*3/uL — ABNORMAL HIGH (ref 150–400)
RBC: 2.33 MIL/uL — ABNORMAL LOW (ref 4.22–5.81)
RDW: 23.4 % — ABNORMAL HIGH (ref 11.5–15.5)
WBC: 19.5 10*3/uL — ABNORMAL HIGH (ref 4.0–10.5)

## 2012-07-12 LAB — COMPREHENSIVE METABOLIC PANEL
ALT: 16 U/L (ref 0–53)
AST: 31 U/L (ref 0–37)
Albumin: 4.1 g/dL (ref 3.5–5.2)
Calcium: 9 mg/dL (ref 8.4–10.5)
Creatinine, Ser: 0.53 mg/dL (ref 0.50–1.35)
Sodium: 137 mEq/L (ref 135–145)
Total Protein: 7.6 g/dL (ref 6.0–8.3)

## 2012-07-12 MED ORDER — HYDROMORPHONE 0.3 MG/ML IV SOLN: INTRAVENOUS | Status: DC

## 2012-07-12 MED ORDER — MORPHINE SULFATE ER 15 MG PO TBCR: 15.0000 mg | EXTENDED_RELEASE_TABLET | Freq: Two times a day (BID) | ORAL | Status: DC

## 2012-07-12 MED ORDER — ZOLPIDEM TARTRATE 10 MG PO TABS: 10.0000 mg | ORAL_TABLET | Freq: Every evening | ORAL | Status: DC | PRN

## 2012-07-12 MED ORDER — HYDROMORPHONE HCL PF 2 MG/ML IJ SOLN
2.0000 mg | INTRAMUSCULAR | Status: AC
  Administered 2012-07-12 (×2): 2 mg via INTRAVENOUS
  Filled 2012-07-12 (×2): qty 1

## 2012-07-12 MED ORDER — HYDROMORPHONE HCL 4 MG PO TABS: 4.0000 mg | ORAL_TABLET | ORAL | Status: DC | PRN

## 2012-07-12 MED ORDER — SODIUM CHLORIDE 0.9 % IJ SOLN: 9.0000 mL | INTRAMUSCULAR | Status: DC | PRN

## 2012-07-12 MED ORDER — HYDROMORPHONE HCL PF 2 MG/ML IJ SOLN
2.0000 mg | INTRAMUSCULAR | Status: DC | PRN
  Administered 2012-07-12 – 2012-07-13 (×6): 2 mg via INTRAVENOUS
  Filled 2012-07-12 (×6): qty 1

## 2012-07-12 MED ORDER — HYDROMORPHONE HCL PF 2 MG/ML IJ SOLN: 2.0000 mg | INTRAMUSCULAR | Status: DC | PRN

## 2012-07-12 MED ORDER — NALOXONE HCL 0.4 MG/ML IJ SOLN: 0.4000 mg | INTRAMUSCULAR | Status: DC | PRN

## 2012-07-12 MED ORDER — MORPHINE SULFATE ER 15 MG PO TBCR
15.0000 mg | EXTENDED_RELEASE_TABLET | Freq: Two times a day (BID) | ORAL | Status: DC
  Administered 2012-07-12 – 2012-07-13 (×3): 15 mg via ORAL
  Filled 2012-07-12 (×3): qty 1

## 2012-07-12 MED ORDER — HYDROXYUREA 500 MG PO CAPS: 1000.0000 mg | ORAL_CAPSULE | Freq: Every day | ORAL | Status: DC

## 2012-07-12 NOTE — Progress Notes (Signed)
Patient continuously goes to the bathroom at different times throughout the night, patient appears to be in no pain, can barely stay awake and is having a seizure, but when asked "I've never had a seizure", will continue to monitor, possible self medication in the bathroom,

## 2012-07-12 NOTE — Progress Notes (Signed)
SICKLE CELL SERVICE PROGRESS NOTE  [name] XBJ:[id number] DOB: 12/03/1980 DOA: 07/09/2012 PCP: Jonathandavid Marlett A., MD  Assessment/Plan: Principal Problem:   Acute pulmonary embolism Active Problems:   Chronic anticoagulation   Sickle cell anemia   Hypokalemia   Sickle cell anemia with crisis   Chest pain  1. Acute PE: Pt presented with recurrent PE after he forgot his Xarelto at home when he went out of town for 1 week.He awoke with a sudden onset of pain in the left chest wall 2 nights ago and came to the ED for evalaution as it felt similar to when he was diagnosed wit PE in the past. I have spoken with Dr. Vassie Loll of pulmonology who recommends continuing with Xarelto. Pt will also need a repeat ECHO in 6-8 weeks to evaluate for pulmonary HTN. 2. Sickle Cell Crisis: Pt has not been on his MS Contin during this hospitalization. Will restart MS Contin and continue IV Dilaudid every 2 hours as needed. Will resume oral dilaudid likely tomorrow and plan for discharge tomorrow if symptoms continue to be controlled as they are now. 3. Hb SS: Pt has been doing well wit his disease management. 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  4. Leukocytosis: He has no signs of infection and my assessment is tha this is related to PE as well as acute crisis likely precipitated by the PE. Will continue with symptomatic management and monitor for signs of infection. Check urine. Anemia: Pt has had further decrease in Hb and evidence of hemolysis. Will transfuse 1 Unit PRBC. Code Status: Full Code Family Communication: N/A Disposition Plan: Home likely tomorrow  Marbin Olshefski A.  Pager [phone]. If 7PM-7AM, please contact night-coverage.  07/12/2012, 11:04 AM  LOS: 3 days   Brief narrative: 32 year old male with sickle cell disease, history of PE on chronic anticoagulation,  hypertension whopresented to the ED with left-sided chest pain since one day. Patient informs waking up with  left-sided chest pain which was nonradiating and sharp in nature. Chest pain worsens with deep inspiration. He informs having some shortness of breath as well. Denies any cough or hemoptysis. Patient thought his symptoms were related to sickle cell crisis as he had similar chest pain symptoms during those attacks and came to the ED. In the ED he was tachypneic but otherwise vitals stable. The lab work showed leukocytosis and chronic anemia. A CT angiogram of the chest done given history of PE showed no pulmonary emboli over bilateral upper lobe and left lower lobe . Patient reports that he recently went out of town and missed almost 10 days off he is xarelto dose. Was previously treated with Coumadin and recently switched to xarelto about 6 weeks back given issue with noncompliance with monitoring INR.   Consultants:  [facility]  Procedures:  none  Antibiotics:  none  HPI/Subjective: Pt states that pain is localized to left side of chest wall and is 6-7/10. Pain relief is lasting about 11/2 hours.   Objective: Filed Vitals:   07/11/12 0700 07/11/12 1300 07/11/12 2130 07/12/12 0600  BP: 133/73 133/87 160/87 128/85  Pulse: 99 90 90 75  Temp: 98.7 F (37.1 C) 98.7 F (37.1 C) 98.5 F (36.9 C) 98.5 F (36.9 C)  TempSrc: Oral Oral Oral Oral  Resp:  19 20 20   Height:      Weight:      SpO2:  93%  93% 90%   Weight change:   Intake/Output Summary (Last 24 hours) at 07/12/12 1104 Last data filed at 07/11/12 1230  Gross per 24 hour  Intake    240 ml  Output      0 ml  Net    240 ml    General: Alert, awake, oriented x3, in no acute distress. Pt up ambulating in room. HEENT: [place]/AT PEERL, EOMI, mild icterus Heart: Regular rate and rhythm, without murmurs, rubs, gallops.  Lungs: Clear to auscultation, no wheezing or rhonchi noted.  Abdomen: Soft, nontender,  nondistended, positive bowel sounds, no masses no hepatosplenomegaly noted.  Neuro: No focal neurological deficits noted cranial nerves II through XII grossly intact.  Strength normal in bilateral upper and lower extremities. Musculoskeletal: No warm swelling or erythema around joints, no spinal tenderness noted. Psychiatric: Patient alert and oriented x3, good insight and cognition, good recent to remote recall.    Data Reviewed: Basic Metabolic Panel:  Recent Labs Lab 07/09/12 1248 07/10/12 0558 07/11/12 0431 07/12/12 0455  NA 142 138 139 137  K 3.4* 3.5 3.8 3.8  CL 110 105 107 106  CO2  --  23 22 23   GLUCOSE 101* 106* 122* 93  BUN 4* 6 6 6   CREATININE 0.40* 0.63 0.55 0.53  CALCIUM  --  8.7 9.2 9.0   Liver Function Tests:  Recent Labs Lab 07/09/12 1230 07/11/12 0431 07/12/12 0455  AST 26 32 31  ALT 16 17 16   ALKPHOS 79 90 86  BILITOT 6.1* 7.7* 6.1*  PROT 7.4 7.7 7.6  ALBUMIN 3.9 4.2 4.1   No results found for this basename: LIPASE, AMYLASE,  in the last 168 hours No results found for this basename: AMMONIA,  in the last 168 hours CBC:  Recent Labs Lab 07/09/12 1230 07/09/12 1248 07/10/12 0558 07/11/12 0431 07/12/12 0455  WBC 21.4*  --  22.6* 23.5* 19.5*  NEUTROABS  --   --   --  13.6* 11.5*  HGB 7.7* 8.5* 7.2* 7.5* 6.9*  HCT 22.4* 25.0* 20.8* 21.2* 20.5*  MCV 87.8  --  86.7 87.2 88.0  PLT 538*  --  489* 454* 463*   Cardiac Enzymes: No results found for this basename: CKTOTAL, CKMB, CKMBINDEX, TROPONINI,  in the last 168 hours BNP (last 3 results)  Recent Labs  07/18/11 1500  PROBNP 13.5   CBG: No results found for this basename: GLUCAP,  in the last 168 hours  No results found for this or any previous visit (from the past 240 hour(s)).   Studies: Dg Chest 2 View  06/20/2012   *RADIOLOGY REPORT*  Clinical Data: Shortness of breath.  CHEST - 2 VIEW  Comparison: 05/29/2012.  Findings: The power port is stable.  The heart is borderline enlarged  but unchanged.  The mediastinal and hilar contours are stable.  There are chronic bronchitic type interstitial lung changes but no infiltrates, edema or effusions.  The bony thorax is intact.  IMPRESSION:  Chronic bronchitic type interstitial lung changes but no acute overlying pulmonary process.   Original Report Authenticated By: Rudie Meyer, M.D.   Ct Angio Chest Pe W/cm &/or Wo Cm  07/09/2012   *RADIOLOGY REPORT*  Clinical Data:  Pleuritic left chest pain beginning today, cough, question pulmonary embolism; history sickle cell crisis, pulmonary embolism, hypertension  CT ANGIOGRAPHY CHEST  Technique:  Multidetector CT imaging of the chest using the standard protocol during bolus administration of intravenous contrast. Multiplanar reconstructed images including MIPs were obtained  and reviewed to evaluate the vascular anatomy.  Contrast: OMNIPAQUE IOHEXOL 350 MG/ML SOLN  Comparison: 02/20/2012  Findings: Diffuse thyroid enlargement. Left subclavian Port-A-Cath with tip within the right atrium. Aorta normal caliber without aneurysm or dissection. Small filling defects identified within bilateral upper lobe pulmonary arteries consistent with new pulmonary emboli. Filling defects in right lower lobe pulmonary artery similar to previous exam. Filling defects identified left lower lobe pulmonary arteries, new.  Mildly enlarged right hilar lymph node 19 x 16 mm image 45. Multiple normal upper normal-sized anterior mediastinal nodes again seen. Enlarged azygo esophageal recess node 2.2 x 2.0 cm image 52. Small calcified spleen. Visualized portion of liver appears dense, can be seen with multiple etiologies but in a sickle cell patient who has received prior transfusions question iron overload or hemochromatosis.  Heart appears enlarged. Scattered areas of parenchymal lung scarring with bibasilar atelectasis as well as peripheral triangular density in the left upper lobe question related to pulmonary embolus. No  definite acute infiltrate pleural effusion or pneumothorax. 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  IMPRESSION: New pulmonary emboli identified in bilateral upper lobe and left lower lobe pulmonary arteries. Enlargement of cardiac silhouette consistent with sickle cell disease. Scattered areas of atelectasis with triangular peripheral opacity in the left upper lobe which could be related to a pulmonary embolus. Dense liver question hemochromatosis versus iron overload. Nonspecific enlarged right hilar and azygo esophageal recess lymph nodes.  Critical Value/emergent results were called by telephone at the time of interpretation on 07/09/2012 at 1423 hours to Dr. Ethelda Chick, who verbally acknowledged these results.   Original Report Authenticated By: Ulyses Southward, M.D.    Scheduled Meds: . folic acid  1 mg Oral q morning - 10a  . hydroxyurea  500 mg Oral BID  . ketorolac  30 mg Intravenous Q6H  . morphine  15 mg Oral Q12H  . potassium chloride SA  20 mEq Oral q morning - 10a  . [START ON 07/31/2012] rivaroxaban  20 mg Oral Q supper  . rivaroxaban  15 mg Oral BID WC  . sodium chloride  3 mL Intravenous Q12H   Continuous Infusions: . sodium chloride 100 mL/hr at 07/11/12 1501    Principal Problem:   Acute pulmonary embolism Active Problems:   Chronic anticoagulation   Sickle cell anemia   Hypokalemia   Sickle cell anemia with crisis   Chest pain

## 2012-07-12 NOTE — Progress Notes (Signed)
ANTICOAGULATION CONSULT NOTE - Follow Up Consult  Pharmacy Consult for Xarelto  Indication: new pulmonary embolism  Allergies  Allergen Reactions  . Morphine And Related Hives and Rash    Pt states he is able to tolerate Dilaudid with no reactions.   Labs:  Recent Labs  07/10/12 0558 07/11/12 0431 07/12/12 0455  HGB 7.2* 7.5* PENDING  HCT 20.8* 21.2* 20.5*  PLT 489* 454* 463*  CREATININE 0.63 0.55 0.53    Estimated Creatinine Clearance: 144.2 ml/min (by C-G formula based on Cr of 0.53).  Assessment: 32 yoM on chronic anticoagulation PTA for h/o PE presented 6/9 with chest pain.  CT Angio showed new pulmonary emboli identified in bilateral upper lobe and left lower lobe pulmonary arteries.  Lovenox was started with thought of Xarelto failure but after MD found patient had missed ~10 days of Xarelto PTA - MD ordered to resume Xarelto for patient's convenience (no INR checks) and encouraged compliance. Doppler neg for DVT bilaterally.  Hgb stable at ~7 g/dL the past 2 days. Hgb this AM pending. Plts wnl. No bleeding documented.   Plan:   Continue Xarelto 15 mg BID for a total of 21 days then start Xarelto 20 mg po daily.   Pharmacy will f/u  Geoffry Paradise, PharmD, BCPS Pager: [phone] 7:48 AM Pharmacy #: [phone]

## 2012-07-12 NOTE — Progress Notes (Signed)
CRITICAL VALUE ALERT  Critical value received:  hgb 6.9  Date of notification:  07-12-12  Time of notification:  0756  Critical value read back: yes  Nurse who received alert:  Silvano Bilis  MD notified (1st page):  Dr. Ashley Royalty  Time of first page:  [phone]  MD notified (2nd page):  Time of second page:  Responding MD:  Dr. Ashley Royalty  Time MD responded:  [phone]

## 2012-07-12 NOTE — Progress Notes (Signed)
Pt presently in hospital. Prescriptions for chronic medications for MS Contin, Dilaudid, hydroxyurea  and Ambien refilled: 1 month of MS Contin, 2 weeks of Dilaudid, 1 month of Hydrea and 1 month of Ambien. Pt has refills on Folic acid

## 2012-07-13 LAB — CBC WITH DIFFERENTIAL/PLATELET
Basophils Relative: 2 % — ABNORMAL HIGH (ref 0–1)
Eosinophils Relative: 8 % — ABNORMAL HIGH (ref 0–5)
Hemoglobin: 6.8 g/dL — CL (ref 13.0–17.0)
Lymphocytes Relative: 17 % (ref 12–46)
MCH: 30.1 pg (ref 26.0–34.0)
Monocytes Absolute: 2.1 10*3/uL — ABNORMAL HIGH (ref 0.1–1.0)
Monocytes Relative: 13 % — ABNORMAL HIGH (ref 3–12)
Neutrophils Relative %: 60 % (ref 43–77)
Platelets: 416 10*3/uL — ABNORMAL HIGH (ref 150–400)
RBC: 2.26 MIL/uL — ABNORMAL LOW (ref 4.22–5.81)
WBC: 16.1 10*3/uL — ABNORMAL HIGH (ref 4.0–10.5)

## 2012-07-13 LAB — PREPARE RBC (CROSSMATCH)

## 2012-07-13 LAB — HEMOGLOBIN AND HEMATOCRIT, BLOOD: HCT: 22.9 % — ABNORMAL LOW (ref 39.0–52.0)

## 2012-07-13 MED ORDER — HEPARIN SOD (PORK) LOCK FLUSH 100 UNIT/ML IV SOLN: 500.0000 [IU] | INTRAVENOUS | Status: DC | PRN

## 2012-07-13 MED ORDER — RIVAROXABAN 20 MG PO TABS: 20.0000 mg | ORAL_TABLET | Freq: Every day | ORAL | Status: DC

## 2012-07-13 MED ORDER — HYDROMORPHONE HCL PF 2 MG/ML IJ SOLN
2.0000 mg | INTRAMUSCULAR | Status: DC | PRN
  Administered 2012-07-13: 2 mg via INTRAVENOUS
  Filled 2012-07-13: qty 1

## 2012-07-13 MED ORDER — HYDROMORPHONE HCL 4 MG PO TABS
4.0000 mg | ORAL_TABLET | ORAL | Status: DC | PRN
  Administered 2012-07-13: 4 mg via ORAL
  Filled 2012-07-13: qty 1

## 2012-07-13 MED ORDER — HEPARIN SOD (PORK) LOCK FLUSH 100 UNIT/ML IV SOLN: 250.0000 [IU] | INTRAVENOUS | Status: DC | PRN

## 2012-07-13 NOTE — Discharge Summary (Signed)
DISCHARGE SUMMARY  [name]  MR#: [id number]  DOB:1980/08/02  Date of Admission: 07/09/2012 Date of Discharge: 07/13/2012  Attending Physician:Cory Kitt  Patient's WUJ:WJXBJYNW,GNFAOZHY A., MD  Consults: Dr Vassie Loll - critical care  Discharge Diagnoses: Present on Admission:  . Sickle cell anemia . Sickle cell anemia with crisis . Acute pulmonary embolism . Hypokalemia . Chest pain      Medication List    TAKE these medications       folic acid 1 MG tablet  Commonly known as:  FOLVITE  Take 1 tablet (1 mg total) by mouth every morning.     HYDROmorphone 4 MG tablet  Commonly known as:  DILAUDID  Take 1 tablet (4 mg total) by mouth every 4 (four) hours as needed for pain.     hydroxyurea 500 MG capsule  Commonly known as:  HYDREA  Take 2 capsules (1,000 mg total) by mouth daily. May take with food to minimize GI side effects.     morphine 15 MG 12 hr tablet  Commonly known as:  MS CONTIN  Take 1 tablet (15 mg total) by mouth 2 (two) times daily.     morphine 15 MG tablet  Commonly known as:  MSIR  Take 15 mg by mouth every 4 (four) hours as needed for pain.     potassium chloride SA 20 MEQ tablet  Commonly known as:  K-DUR,KLOR-CON  Take 20 mEq by mouth every morning.     Rivaroxaban 20 MG Tabs  Commonly known as:  XARELTO  Take 1 tablet (20 mg total) by mouth daily with supper.  Start taking on:  07/31/2012     zolpidem 10 MG tablet  Commonly known as:  AMBIEN  Take 1 tablet (10 mg total) by mouth at bedtime as needed. For sleep          Hospital Course: Present on Admission:  . Sickle cell anemia . Sickle cell anemia with crisis . Recurrent pulmonary embolism . Hypokalemia . Chest pain:  This is a 32 y/o male well known to the service who presented with chest pain found to have a recurrent pulmonary embolism. He was on Xarelto as an outpatient but had discontinued it while traveling for 10 days. For this reason it was not considered  failure of medication, PE was due to non-compliance. Dr Vassie Loll intensivist was consulted who agreed with continuation of Xarelto, he recommends repeat echo in 6-8 week to evaluate pulmonary HTN. The patient was extensively counseled on non-compliance and the dangers of PE. He did have mild acute VOC, treated with pain medication, he had downward trend in his hemoglobin, he has been transfused and will follow up in the sickle cell clinic.    Results for orders placed during the hospital encounter of 07/09/12 (from the past 24 hour(s))  TYPE AND SCREEN     Status: None   Collection Time    07/12/12 10:20 AM      Result Value Range   ABO/RH(D) O POS     Antibody Screen NEG     Sample Expiration 07/15/2012     Unit Number [id number]     Blood Component Type RED CELLS,LR     Unit division 00     Status of Unit ISSUED,FINAL     Donor AG Type       Value: NEGATIVE FOR C ANTIGEN NEGATIVE FOR E ANTIGEN NEGATIVE FOR KELL ANTIGEN   Transfusion Status OK TO TRANSFUSE     Crossmatch Result COMPATIBLE  CBC WITH DIFFERENTIAL     Status: Abnormal   Collection Time    07/13/12  4:35 AM      Result Value Range   WBC 16.1 (*) 4.0 - 10.5 K/uL   RBC 2.26 (*) 4.22 - 5.81 MIL/uL   Hemoglobin 6.8 (*) 13.0 - 17.0 g/dL   HCT 82.9 (*) 56.2 - 13.0 %   MCV 87.6  78.0 - 100.0 fL   MCH 30.1  26.0 - 34.0 pg   MCHC 34.3  30.0 - 36.0 g/dL   RDW 86.5 (*) 78.4 - 69.6 %   Platelets 416 (*) 150 - 400 K/uL   Neutrophils Relative % 60  43 - 77 %   Lymphocytes Relative 17  12 - 46 %   Monocytes Relative 13 (*) 3 - 12 %   Eosinophils Relative 8 (*) 0 - 5 %   Basophils Relative 2 (*) 0 - 1 %   Neutro Abs 9.7 (*) 1.7 - 7.7 K/uL   Lymphs Abs 2.7  0.7 - 4.0 K/uL   Monocytes Absolute 2.1 (*) 0.1 - 1.0 K/uL   Eosinophils Absolute 1.3 (*) 0.0 - 0.7 K/uL   Basophils Absolute 0.3 (*) 0.0 - 0.1 K/uL   RBC Morphology POLYCHROMASIA PRESENT     Smear Review LARGE PLATELETS PRESENT      Disposition: home   Follow-up  Appts:   Follow-up with Dr.[name], Sickle cell clinic in 1 week.   Tests Needing Follow-up: Repeat CBC in 1 week Repeat 2D echo in 6-8 weeks  Signed: Maki Sweetser 07/13/2012, 9:09 AM

## 2012-07-13 NOTE — Progress Notes (Signed)
Patient discharged per MD order after receiving 1 unit of PRBCs and drawing a post H&H. Discharge instructions reviewed with patient and xarelto prescription given to patient. Patient refused wheelchair and ambulated off floor for discharge home. Angelena Form, RN

## 2012-07-13 NOTE — Progress Notes (Signed)
Assessment/Plan: @PROBHOSP @  LOS: 4 days   Principal Problem:  Acute pulmonary embolism  Active Problems:  Chronic anticoagulation  Sickle cell anemia  Hypokalemia  Sickle cell anemia with crisis  Chest pain  1. Acute PE: Continue Xarelto. Patient failed to take medication while out of town for 10 days. This is not consideded a failure of medication,  Patient seen by Dr Vassie Loll who endorses continuation of Xarelto.  Dr Vassie Loll also recommends a repeat ECHO in 6-8 weeks outpatient to evaluate for pulmonary HTN. 2. Sickle Cell Crisis: Pt has not been on his MS Contin during this hospitalization. MS Contin restarted and change IV Dilaudid every 3 hours as needed. Will resume oral dilaudid and plan for discharge tomorrow if symptoms continue to be controlled as they are now. Advise nursing to give oral medication first and IV only if needed. 3. Hb SS: Pt has been doing well wit his disease management. 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  4. Leukocytosis: He has no signs of infection and my assessment is tha this is related to PE as well as acute crisis likely precipitated by the PE. Will continue with symptomatic management and monitor for signs of infection. Check urine.       5.   Anemia: Pt has had further decrease in Hb despite being transfused 1 unit PRBC yesterday.  Patient with             evidence of hemolysis. Will transfuse another unit PRBC especially as patient on a blood thinner.     Patient wants to go home today. Will transfuse, then discharge patient home with close follow up in clinic.   Code Status: Full Code  Family Communication: N/A  Disposition Plan: Home likely tomorrow    Subjective: Patient seen, pain improving now 6/10 in bilateral ribs. Patient received 1 unit PRBC yesterday, hemoglobin today lower than yesterday.   Objective: Weight change:    Intake/Output Summary (Last 24 hours) at 07/13/12 0845 Last data filed at 07/13/12 4098  Gross per 24 hour  Intake   8855 ml  Output      0 ml  Net   8855 ml   BP 151/85  Pulse 66  Temp(Src) 98.1 F (36.7 C) (Oral)  Resp 18  Ht 6' (1.829 m)  Wt 79.379 kg (175 lb)  BMI 23.73 kg/m2  SpO2 94%  General Appearance:    Alert, cooperative, no distress, appears stated age  Head:    Normocephalic, without obvious abnormality, atraumatic  Eyes:    PERRL, conjunctiva/corneas clear, EOM's mild jaundice     Nose:   Nares normal, septum midline, mucosa normal, no drainage    or sinus tenderness  Throat:   Lips, mucosa, and tongue normal; teeth and gums normal  Neck:   Supple, symmetrical, trachea midline, no adenopathy;       thyroid:  No enlargement/tenderness/nodules; no carotid   bruit or JVD  Back:     Symmetric, no curvature, ROM normal, no CVA tenderness  Lungs:     Clear to auscultation bilaterally, respirations unlabored  Chest wall:    No tenderness or deformity  Heart:    Regular rate and rhythm, S1 and S2 normal, no murmur, rub   or gallop  Abdomen:     Soft, non-tender, bowel sounds active all four quadrants,    no masses, no organomegaly  Extremities:   Extremities normal, atraumatic, no cyanosis or edema  Pulses:   2+ and symmetric all extremities  Skin:   Skin color, texture, turgor normal, no rashes or lesions  Lymph nodes:   Cervical, supraclavicular, and axillary nodes normal  Neurologic:   CNII-XII intact. Normal strength, sensation and reflexes      throughout    Lab Results:  Recent Labs  07/11/12 0431 07/12/12 0455  NA 139 137  K 3.8 3.8  CL 107 106  CO2 22 23  GLUCOSE 122* 93  BUN 6 6  CREATININE 0.55 0.53  CALCIUM 9.2 9.0    Recent Labs  07/11/12 0431 07/12/12 0455  AST 32 31  ALT 17 16  ALKPHOS 90 86  BILITOT 7.7* 6.1*  PROT 7.7 7.6  ALBUMIN 4.2 4.1   No results found for this basename: LIPASE, AMYLASE,  in the last 72  hours  Recent Labs  07/12/12 0455 07/13/12 0435  WBC 19.5* 16.1*  NEUTROABS 11.5* 9.7*  HGB 6.9* 6.8*  HCT 20.5* 19.8*  MCV 88.0 87.6  PLT 463* 416*   No results found for this basename: CKTOTAL, CKMB, CKMBINDEX, TROPONINI,  in the last 72 hours No components found with this basename: POCBNP,  No results found for this basename: DDIMER,  in the last 72 hours No results found for this basename: HGBA1C,  in the last 72 hours No results found for this basename: CHOL, HDL, LDLCALC, TRIG, CHOLHDL, LDLDIRECT,  in the last 72 hours No results found for this basename: TSH, T4TOTAL, FREET3, T3FREE, THYROIDAB,  in the last 72 hours No results found for this basename: VITAMINB12, FOLATE, FERRITIN, TIBC, IRON, RETICCTPCT,  in the last 72 hours  Micro Results: No results found for this or any previous visit (from the past 240 hour(s)).  Studies/Results: Dg Chest 2 View  06/20/2012   *RADIOLOGY REPORT*  Clinical Data: Shortness of breath.  CHEST - 2 VIEW  Comparison: 05/29/2012.  Findings: The power port is stable.  The heart is borderline enlarged but unchanged.  The mediastinal and hilar contours are stable.  There are chronic bronchitic type interstitial lung changes but no infiltrates, edema or effusions.  The bony thorax is intact.  IMPRESSION:  Chronic bronchitic type interstitial lung changes but no acute overlying pulmonary process.   Original Report Authenticated By: Rudie Meyer, M.D.   Ct Angio Chest Pe W/cm &/or Wo Cm  07/09/2012   *RADIOLOGY REPORT*  Clinical Data:  Pleuritic left chest pain beginning today, cough, question pulmonary embolism; history sickle cell crisis, pulmonary embolism, hypertension  CT ANGIOGRAPHY CHEST  Technique:  Multidetector CT imaging of the chest using the standard protocol during bolus administration of intravenous contrast. Multiplanar reconstructed images including MIPs were obtained and reviewed to evaluate the vascular anatomy.  Contrast: OMNIPAQUE  IOHEXOL 350 MG/ML SOLN  Comparison: 02/20/2012  Findings: Diffuse thyroid enlargement. Left subclavian Port-A-Cath with tip within the right atrium. Aorta normal caliber without aneurysm or dissection. Small filling defects identified within bilateral upper lobe pulmonary arteries consistent with new pulmonary emboli. Filling defects in right lower lobe pulmonary artery similar to previous exam. Filling defects identified left lower lobe pulmonary arteries, new.  Mildly enlarged right hilar lymph node 19 x 16 mm image 45. Multiple normal upper normal-sized anterior mediastinal nodes again seen. Enlarged azygo esophageal recess node 2.2 x 2.0 cm image 52. Small calcified spleen. Visualized portion of liver appears dense, can be seen with multiple etiologies but in a sickle cell  patient who has received prior transfusions question iron overload or hemochromatosis.  Heart appears enlarged. Scattered areas of parenchymal lung scarring with bibasilar atelectasis as well as peripheral triangular density in the left upper lobe question related to pulmonary embolus. No definite acute infiltrate pleural effusion or pneumothorax. 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  IMPRESSION: New pulmonary emboli identified in bilateral upper lobe and left lower lobe pulmonary arteries. Enlargement of cardiac silhouette consistent with sickle cell disease. Scattered areas of atelectasis with triangular peripheral opacity in the left upper lobe which could be related to a pulmonary embolus. Dense liver question hemochromatosis versus iron overload. Nonspecific enlarged right hilar and azygo esophageal recess lymph nodes.  Critical Value/emergent results were called by telephone at the time of interpretation on 07/09/2012 at 1423 hours to Dr. Ethelda Chick, who verbally acknowledged these results.   Original Report Authenticated By: Ulyses Southward, M.D.    Medications: Scheduled Meds: . folic acid  1 mg Oral q morning - 10a  . hydroxyurea  500 mg Oral BID  . ketorolac  30 mg Intravenous Q6H  . morphine  15 mg Oral Q12H  . potassium chloride SA  20 mEq Oral q morning - 10a  . [START ON 07/31/2012] rivaroxaban  20 mg Oral Q supper  . rivaroxaban  15 mg Oral BID WC  . sodium chloride  3 mL Intravenous Q12H   Continuous Infusions:  PRN Meds:.acetaminophen, acetaminophen, HYDROmorphone (DILAUDID) injection, naloxone, ondansetron (ZOFRAN) IV, sodium chloride, sodium chloride, zolpidem   [name] 07/13/2012, 8:45 AM

## 2012-07-14 LAB — TYPE AND SCREEN: Unit division: 0

## 2012-07-24 ENCOUNTER — Emergency Department (HOSPITAL_COMMUNITY)
Admission: EM | Admit: 2012-07-24 | Discharge: 2012-07-25 | Disposition: A | Discharge status: Home / Self Care | Payer: Medicare Other | Attending: Emergency Medicine | Admitting: Emergency Medicine

## 2012-07-24 ENCOUNTER — Inpatient Hospital Stay: Payer: Medicare Other | Admitting: Primary Care

## 2012-07-24 ENCOUNTER — Encounter (HOSPITAL_COMMUNITY): Payer: Self-pay | Admitting: *Deleted

## 2012-07-24 DIAGNOSIS — Z8739 Personal history of other diseases of the musculoskeletal system and connective tissue: Secondary | ICD-10-CM | POA: Insufficient documentation

## 2012-07-24 DIAGNOSIS — Z86711 Personal history of pulmonary embolism: Secondary | ICD-10-CM | POA: Diagnosis not present

## 2012-07-24 DIAGNOSIS — Z8679 Personal history of other diseases of the circulatory system: Secondary | ICD-10-CM | POA: Diagnosis not present

## 2012-07-24 DIAGNOSIS — D5700 Hb-SS disease with crisis, unspecified: Secondary | ICD-10-CM | POA: Insufficient documentation

## 2012-07-24 DIAGNOSIS — Z7901 Long term (current) use of anticoagulants: Secondary | ICD-10-CM | POA: Insufficient documentation

## 2012-07-24 DIAGNOSIS — Z87891 Personal history of nicotine dependence: Secondary | ICD-10-CM | POA: Diagnosis not present

## 2012-07-24 DIAGNOSIS — D571 Sickle-cell disease without crisis: Secondary | ICD-10-CM | POA: Diagnosis not present

## 2012-07-24 DIAGNOSIS — F39 Unspecified mood [affective] disorder: Secondary | ICD-10-CM | POA: Diagnosis not present

## 2012-07-24 DIAGNOSIS — Z862 Personal history of diseases of the blood and blood-forming organs and certain disorders involving the immune mechanism: Secondary | ICD-10-CM | POA: Diagnosis not present

## 2012-07-24 DIAGNOSIS — I10 Essential (primary) hypertension: Secondary | ICD-10-CM | POA: Insufficient documentation

## 2012-07-24 DIAGNOSIS — Z79899 Other long term (current) drug therapy: Secondary | ICD-10-CM | POA: Diagnosis not present

## 2012-07-24 DIAGNOSIS — Z8639 Personal history of other endocrine, nutritional and metabolic disease: Secondary | ICD-10-CM | POA: Insufficient documentation

## 2012-07-24 MED ORDER — HYDROMORPHONE HCL PF 1 MG/ML IJ SOLN: 1.0000 mg | Freq: Once | INTRAMUSCULAR | Status: DC

## 2012-07-24 MED ORDER — DIPHENHYDRAMINE HCL 50 MG/ML IJ SOLN
25.0000 mg | Freq: Once | INTRAMUSCULAR | Status: AC
  Administered 2012-07-25: 25 mg via INTRAVENOUS
  Filled 2012-07-24: qty 1

## 2012-07-24 MED ORDER — SODIUM CHLORIDE 0.9 % IV BOLUS (SEPSIS)
1000.0000 mL | Freq: Once | INTRAVENOUS | Status: AC
  Administered 2012-07-25: 1000 mL via INTRAVENOUS

## 2012-07-24 MED ORDER — HYDROMORPHONE HCL PF 2 MG/ML IJ SOLN
2.0000 mg | Freq: Once | INTRAMUSCULAR | Status: AC
  Administered 2012-07-25: 2 mg via INTRAVENOUS
  Filled 2012-07-24: qty 1

## 2012-07-24 NOTE — ED Notes (Signed)
Per pt report: pt reporting sickle cell pain that began around 16:00 today.  Pt reports pain in both sides of ribs and back.

## 2012-07-25 ENCOUNTER — Telehealth: Payer: Self-pay | Admitting: Internal Medicine

## 2012-07-25 LAB — CBC WITH DIFFERENTIAL/PLATELET
Basophils Relative: 1 % (ref 0–1)
Eosinophils Relative: 3 % (ref 0–5)
HCT: 23.5 % — ABNORMAL LOW (ref 39.0–52.0)
Hemoglobin: 7.9 g/dL — ABNORMAL LOW (ref 13.0–17.0)
Lymphocytes Relative: 19 % (ref 12–46)
Monocytes Relative: 10 % (ref 3–12)
RBC: 2.58 MIL/uL — ABNORMAL LOW (ref 4.22–5.81)
WBC: 18.4 10*3/uL — ABNORMAL HIGH (ref 4.0–10.5)
nRBC: 1 /100 WBC — ABNORMAL HIGH

## 2012-07-25 LAB — COMPREHENSIVE METABOLIC PANEL
ALT: 14 U/L (ref 0–53)
Alkaline Phosphatase: 83 U/L (ref 39–117)
CO2: 25 mEq/L (ref 19–32)
GFR calc Af Amer: >90 mL/min (ref >90)
GFR calc non Af Amer: >90 mL/min (ref >90)
Glucose, Bld: 114 mg/dL — ABNORMAL HIGH (ref 70–99)
Potassium: 3.3 mEq/L — ABNORMAL LOW (ref 3.5–5.1)
Sodium: 138 mEq/L (ref 135–145)
Total Protein: 7.5 g/dL (ref 6.0–8.3)

## 2012-07-25 MED ORDER — DIPHENHYDRAMINE HCL 50 MG/ML IJ SOLN
25.0000 mg | Freq: Once | INTRAMUSCULAR | Status: AC
  Administered 2012-07-25: 25 mg via INTRAVENOUS
  Filled 2012-07-25: qty 1

## 2012-07-25 MED ORDER — HYDROMORPHONE HCL PF 2 MG/ML IJ SOLN
2.0000 mg | Freq: Once | INTRAMUSCULAR | Status: AC
  Administered 2012-07-25: 2 mg via INTRAVENOUS
  Filled 2012-07-25: qty 1

## 2012-07-25 MED ORDER — SODIUM CHLORIDE 0.9 % IV BOLUS (SEPSIS)
1000.0000 mL | Freq: Once | INTRAVENOUS | Status: AC
  Administered 2012-07-25: 1000 mL via INTRAVENOUS

## 2012-07-25 MED ORDER — HEPARIN SOD (PORK) LOCK FLUSH 100 UNIT/ML IV SOLN
INTRAVENOUS | Status: AC
  Filled 2012-07-25: qty 5

## 2012-07-25 NOTE — ED Notes (Signed)
Patient is alert and oriented x3.  He was given DC instructions and follow up visit instructions.  Patient gave verbal understanding.  He was DC ambulatory under his own power to home.  V/S stable.  He was not showing any signs of distress on DC 

## 2012-07-25 NOTE — ED Provider Notes (Signed)
Medical screening examination/treatment/procedure(s) were performed by non-physician practitioner and as supervising physician I was immediately available for consultation/collaboration.  Edithe Dobbin, MD 07/25/12 2259 

## 2012-07-25 NOTE — ED Provider Notes (Signed)
History    CSN: [id number] Arrival date & time 07/24/12  2339  First MD Initiated Contact with Patient 07/24/12 2353     Chief Complaint  Patient presents with  . Sickle Cell Pain Crisis   (Consider location/radiation/quality/duration/timing/severity/associated sxs/prior Treatment) HPI Comments: History sickle cell disease states she's been taking his medication, and how he noticed an increase in his normal pain about 4:00.  This, afternoon.  He took an extra day so his biologic tablets without relief.  He does have a history of PEs currently taking Xarelto is not short of breath  Patient is a 32 y.o. male presenting with sickle cell pain.  Sickle Cell Pain Crisis Location:  Chest and back Severity:  Severe Onset quality:  Gradual Duration:  4 hours Similar to previous crisis episodes: yes   Timing:  Constant Progression:  Worsening Chronicity:  Chronic Sickle cell genotype:  SS Usual hemoglobin level:  8 History of pulmonary emboli: yes   Relieved by:  Nothing Ineffective treatments:  Prescription drugs, hydroxyurea and rest Associated symptoms: no chest pain, no fever, no headaches, no nausea, no shortness of breath and no vomiting    Past Medical History  Diagnosis Date  . Sickle cell anemia   . Blood transfusion   . 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 Hypokalemia   . Mood disorder   . Pulmonary embolism   . Avascular necrosis   . Leukocytosis     Chronic  . Thrombocytosis     Chronic  . Hypertension    Past Surgical History  Procedure Laterality Date  . Right hip replacement      08/2006  . Cholecystectomy      01/2008  . Porta cath placement    . Porta cath removal    . Umbilical hernia repair      01/2008  . Excision of left periauricular cyst      10/2009  . Excision of right ear lobe cyst with primary closur      11/2007  . Portacath placement  01/05/2012    Procedure: INSERTION PORT-A-CATH;  Surgeon: Adolph Pollack,  MD;  Location: [facility] OR;  Service: General;  Laterality: N/A;  ultrasound guiced port a cath insertion with fluoroscopy   Family History  Problem Relation Age of Onset  . Sickle cell anemia Mother   . Sickle cell anemia Father   . Sickle cell trait Brother    History  Substance Use Topics  . Smoking status: Former Smoker -- 13 years    Types: Cigarettes    Quit date: 07/08/2010  . Smokeless tobacco: Never Used  . Alcohol Use: No    Review of Systems  Constitutional: Negative for fever and chills.  Respiratory: Negative for shortness of breath.   Cardiovascular: Negative for chest pain.  Gastrointestinal: Negative for nausea, vomiting and diarrhea.  Genitourinary: Negative for dysuria.  Neurological: Negative for dizziness and headaches.  All other systems reviewed and are negative.    Allergies  Morphine and related  Home Medications   Current Outpatient Rx  Name  Route  Sig  Dispense  Refill  . folic acid (FOLVITE) 1 MG tablet   Oral   Take 1 tablet (1 mg total) by mouth every morning.   30 tablet   11   . HYDROmorphone (DILAUDID) 4 MG tablet   Oral   Take 1 tablet (4 mg total) by mouth every 4 (four) hours as needed for pain.  90 tablet   0   . hydroxyurea (HYDREA) 500 MG capsule   Oral   Take 2 capsules (1,000 mg total) by mouth daily. May take with food to minimize GI side effects.   60 capsule   1   . morphine (MS CONTIN) 15 MG 12 hr tablet   Oral   Take 1 tablet (15 mg total) by mouth 2 (two) times daily.   60 tablet   0   . potassium chloride SA (K-DUR,KLOR-CON) 20 MEQ tablet   Oral   Take 20 mEq by mouth every morning.          . Rivaroxaban (XARELTO) 20 MG TABS   Oral   Take 20 mg by mouth daily.         Marland Kitchen zolpidem (AMBIEN) 10 MG tablet   Oral   Take 1 tablet (10 mg total) by mouth at bedtime as needed. For sleep   30 tablet   0    BP 116/75  Pulse 85  Temp(Src) 98.8 F (37.1 C) (Oral)  Resp 20  Ht 6' (1.829 m)  Wt 173 lb  (78.472 kg)  BMI 23.46 kg/m2  SpO2 97% Physical Exam  Nursing note and vitals reviewed. Constitutional: He is oriented to person, place, and time. He appears well-developed and well-nourished.  HENT:  Head: Normocephalic.  Neck: Normal range of motion.  Cardiovascular: Normal rate.   Pulmonary/Chest: Effort normal and breath sounds normal. He has no wheezes. He exhibits tenderness.  Abdominal: Soft. He exhibits no distension.  Neurological: He is alert and oriented to person, place, and time.  Skin: Skin is warm. No pallor.    ED Course  Procedures (including critical care time) Labs Reviewed  CBC WITH DIFFERENTIAL - Abnormal; Notable for the following:    WBC 18.4 (*)    RBC 2.58 (*)    Hemoglobin 7.9 (*)    HCT 23.5 (*)    RDW 21.4 (*)    Platelets 554 (*)    nRBC 1 (*)    Neutro Abs 12.3 (*)    Monocytes Absolute 1.8 (*)    Basophils Absolute 0.2 (*)    All other components within normal limits  COMPREHENSIVE METABOLIC PANEL - Abnormal; Notable for the following:    Potassium 3.3 (*)    Glucose, Bld 114 (*)    Total Bilirubin 4.9 (*)    All other components within normal limits  RETICULOCYTES - Abnormal; Notable for the following:    Retic Ct Pct 9.5 (*)    RBC. 2.58 (*)    Retic Count, Manual 245.1 (*)    All other components within normal limits   No results found. 1. Sickle cell crisis     MDM    As well.  Multiple doses of dilated and Benadryl 2 L of IV fluid.  Patient states he is feeling, better, and thinks he can be able to manage his pain.  At home  Arman Filter, NP 07/25/12 [phone]

## 2012-07-26 ENCOUNTER — Encounter (HOSPITAL_COMMUNITY): Payer: Self-pay | Admitting: Emergency Medicine

## 2012-07-26 ENCOUNTER — Emergency Department (HOSPITAL_COMMUNITY)
Admission: EM | Admit: 2012-07-26 | Discharge: 2012-07-27 | Disposition: A | Discharge status: Home / Self Care | Payer: Medicare Other | Attending: Emergency Medicine | Admitting: Emergency Medicine

## 2012-07-26 DIAGNOSIS — Z8739 Personal history of other diseases of the musculoskeletal system and connective tissue: Secondary | ICD-10-CM | POA: Insufficient documentation

## 2012-07-26 DIAGNOSIS — Z79899 Other long term (current) drug therapy: Secondary | ICD-10-CM | POA: Insufficient documentation

## 2012-07-26 DIAGNOSIS — D5700 Hb-SS disease with crisis, unspecified: Secondary | ICD-10-CM

## 2012-07-26 DIAGNOSIS — M545 Low back pain, unspecified: Secondary | ICD-10-CM | POA: Diagnosis not present

## 2012-07-26 DIAGNOSIS — Z87891 Personal history of nicotine dependence: Secondary | ICD-10-CM | POA: Insufficient documentation

## 2012-07-26 DIAGNOSIS — R079 Chest pain, unspecified: Secondary | ICD-10-CM | POA: Diagnosis not present

## 2012-07-26 DIAGNOSIS — Z86718 Personal history of other venous thrombosis and embolism: Secondary | ICD-10-CM | POA: Insufficient documentation

## 2012-07-26 DIAGNOSIS — I10 Essential (primary) hypertension: Secondary | ICD-10-CM | POA: Insufficient documentation

## 2012-07-26 DIAGNOSIS — Z7901 Long term (current) use of anticoagulants: Secondary | ICD-10-CM | POA: Insufficient documentation

## 2012-07-26 DIAGNOSIS — Z86711 Personal history of pulmonary embolism: Secondary | ICD-10-CM | POA: Insufficient documentation

## 2012-07-26 DIAGNOSIS — Z862 Personal history of diseases of the blood and blood-forming organs and certain disorders involving the immune mechanism: Secondary | ICD-10-CM | POA: Diagnosis not present

## 2012-07-26 DIAGNOSIS — E876 Hypokalemia: Secondary | ICD-10-CM | POA: Diagnosis not present

## 2012-07-26 DIAGNOSIS — Z8639 Personal history of other endocrine, nutritional and metabolic disease: Secondary | ICD-10-CM | POA: Insufficient documentation

## 2012-07-26 DIAGNOSIS — Z8659 Personal history of other mental and behavioral disorders: Secondary | ICD-10-CM | POA: Insufficient documentation

## 2012-07-26 LAB — RETICULOCYTES
RBC.: 2.55 MIL/uL — ABNORMAL LOW (ref 4.22–5.81)
Retic Ct Pct: 10.1 % — ABNORMAL HIGH (ref 0.4–3.1)

## 2012-07-26 LAB — CBC
HCT: 23.2 % — ABNORMAL LOW (ref 39.0–52.0)
Hemoglobin: 7.8 g/dL — ABNORMAL LOW (ref 13.0–17.0)
MCV: 91 fL (ref 78.0–100.0)
RDW: 21 % — ABNORMAL HIGH (ref 11.5–15.5)
WBC: 17.9 10*3/uL — ABNORMAL HIGH (ref 4.0–10.5)

## 2012-07-26 LAB — COMPREHENSIVE METABOLIC PANEL
Albumin: 3.6 g/dL (ref 3.5–5.2)
BUN: 5 mg/dL — ABNORMAL LOW (ref 6–23)
Chloride: 103 mEq/L (ref 96–112)
Creatinine, Ser: 0.59 mg/dL (ref 0.50–1.35)
GFR calc Af Amer: >90 mL/min (ref >90)
GFR calc non Af Amer: >90 mL/min (ref >90)
Glucose, Bld: 103 mg/dL — ABNORMAL HIGH (ref 70–99)
Total Bilirubin: 4.8 mg/dL — ABNORMAL HIGH (ref 0.3–1.2)

## 2012-07-26 IMAGING — CR DG CHEST 2V
2 series · 2 of 2 positions shown · non-contrast
Comparison: 08/11/2009 chest radiograph and 08/07/2009 chest CT

CLINICAL DATA: Sickle cell crisis.  Chest tightness.

CHEST - 2 VIEW

[w chest pa]
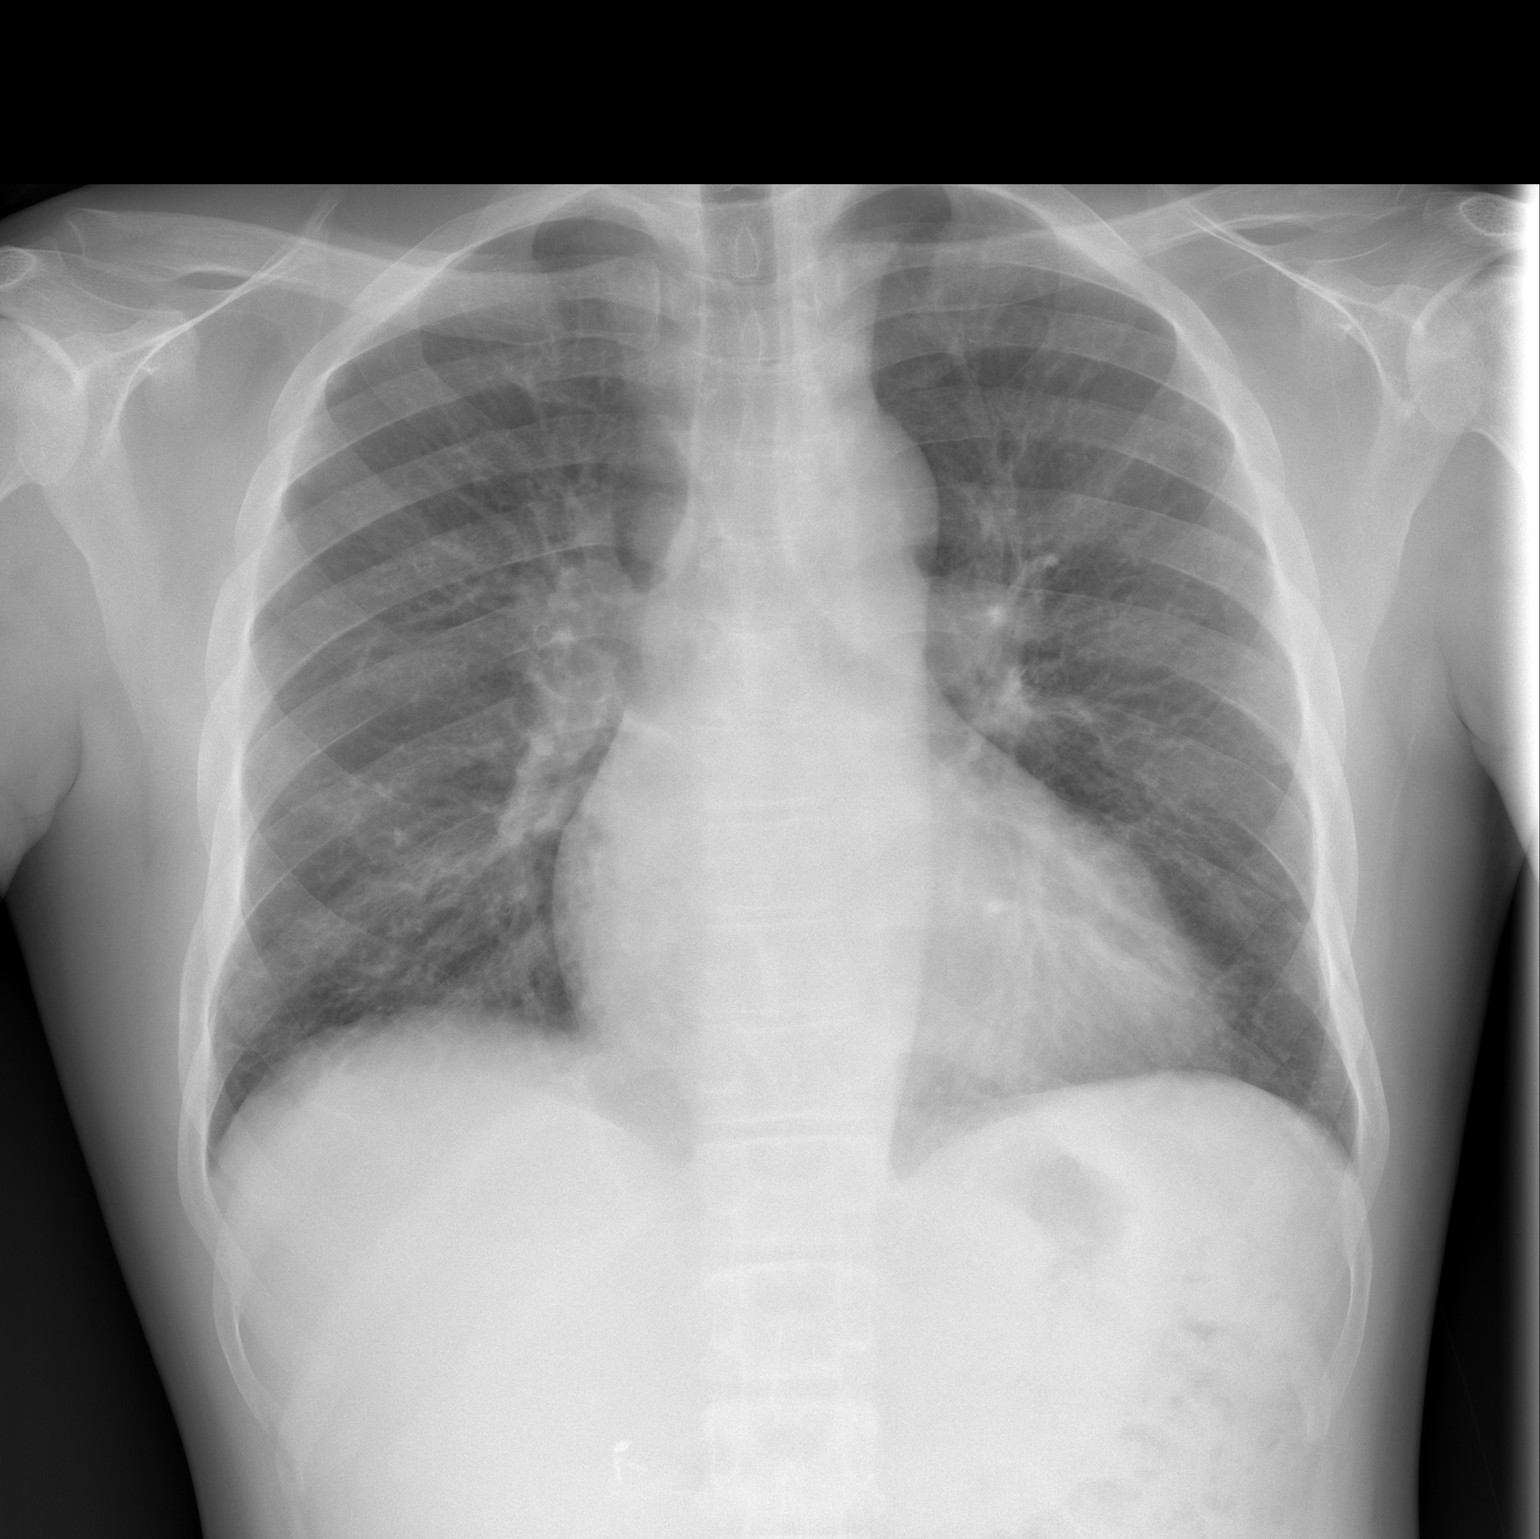

[w chest lat]
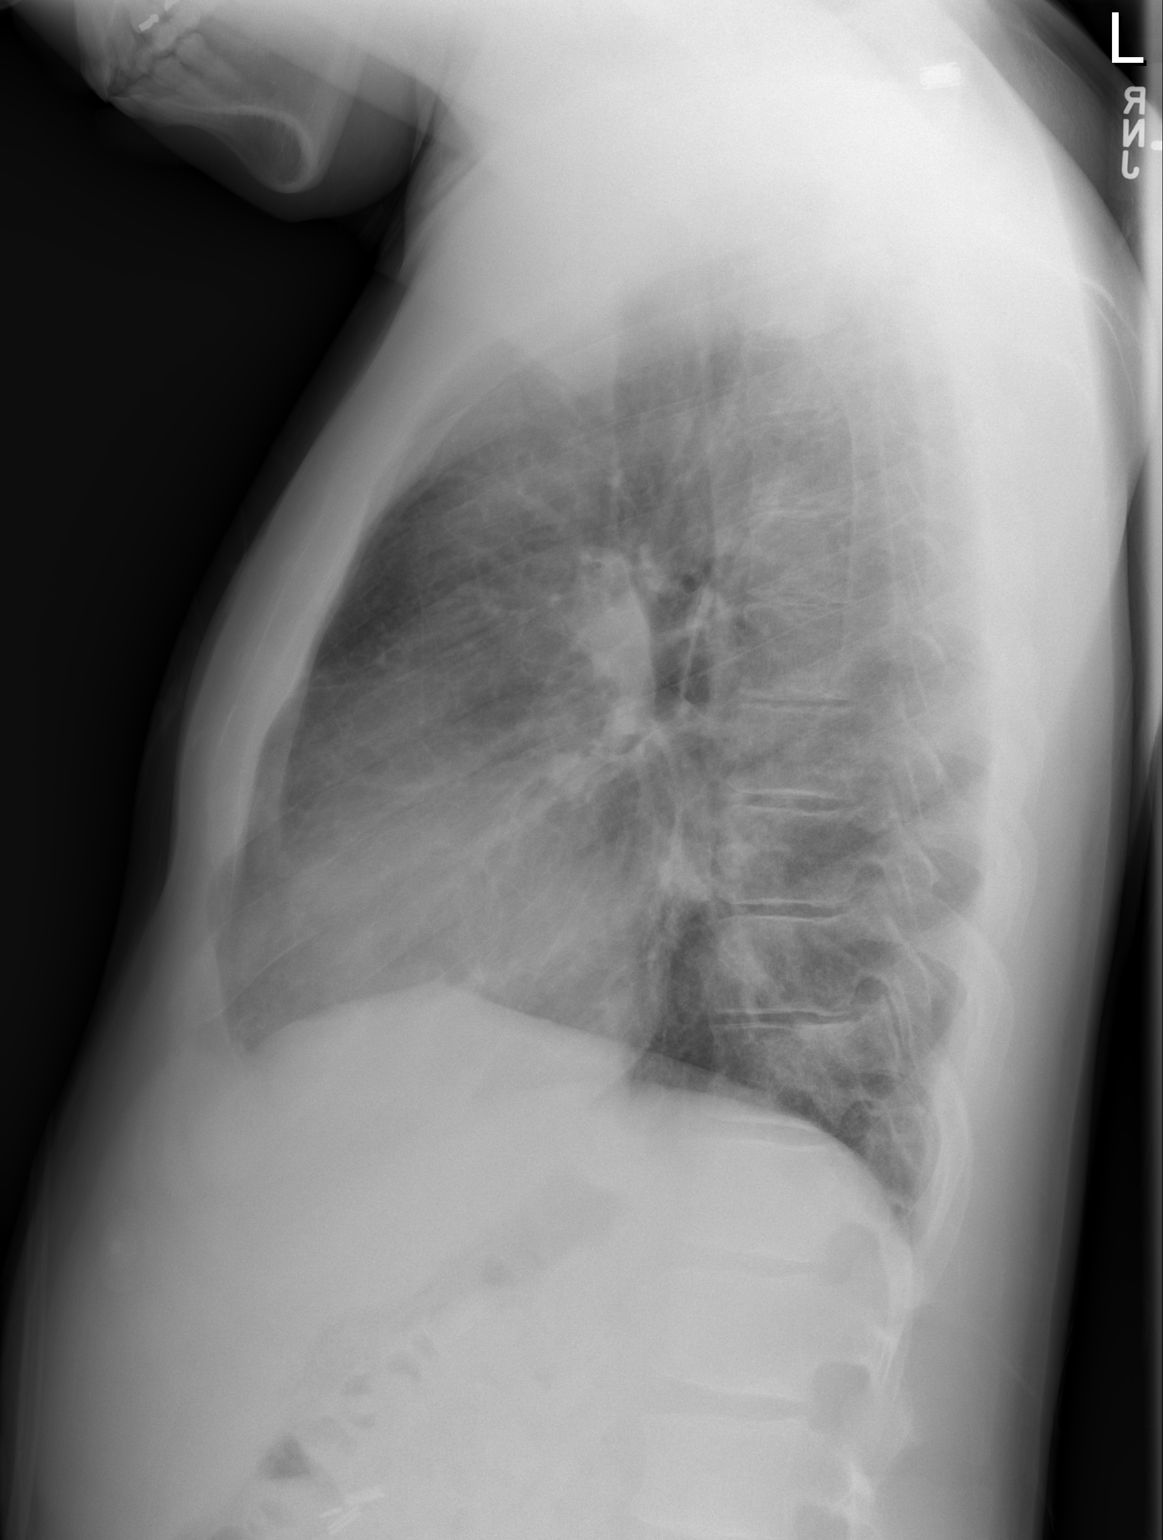

[2 of 2 positions shown; findings below may reference images not displayed]

FINDINGS: Mild cardiomegaly is stable.  Pulmonary vascularity is
normal.  Faint streaky opacity noted posteriorly on the lateral
view without definite correlate on the frontal view.  Overall
aeration of the lungs is improved compared to 08/11/2009.  There is
no edema, consolidation, or large area of atelectasis.  Negative
for pneumothorax.  Trachea is midline.  No evidence of
lymphadenopathy.  Patient is status post cholecystectomy.  No acute
bony abnormality is identified.
IMPRESSION: 1.  Mild streaky atelectasis posteriorly seen on the lateral view.
Otherwise, the lungs are clear.
2.  Stable mild cardiomegaly without edema.

## 2012-07-26 MED ORDER — HYDROMORPHONE HCL PF 2 MG/ML IJ SOLN
2.0000 mg | Freq: Once | INTRAMUSCULAR | Status: AC
  Administered 2012-07-26: 2 mg via INTRAVENOUS
  Filled 2012-07-26: qty 1

## 2012-07-26 MED ORDER — ONDANSETRON 4 MG PO TBDP
4.0000 mg | ORAL_TABLET | Freq: Once | ORAL | Status: AC
  Administered 2012-07-26: 4 mg via ORAL
  Filled 2012-07-26: qty 1

## 2012-07-26 MED ORDER — HEPARIN SOD (PORK) LOCK FLUSH 100 UNIT/ML IV SOLN
INTRAVENOUS | Status: AC
  Administered 2012-07-26: 500 [IU]
  Filled 2012-07-26: qty 5

## 2012-07-26 MED ORDER — POTASSIUM CHLORIDE CRYS ER 20 MEQ PO TBCR
40.0000 meq | EXTENDED_RELEASE_TABLET | Freq: Once | ORAL | Status: AC
  Administered 2012-07-26: 40 meq via ORAL
  Filled 2012-07-26: qty 2

## 2012-07-26 MED ORDER — SODIUM CHLORIDE 0.9 % IV SOLN
Freq: Once | INTRAVENOUS | Status: AC
  Administered 2012-07-26: 22:00:00 via INTRAVENOUS

## 2012-07-26 MED ORDER — DIPHENHYDRAMINE HCL 25 MG PO CAPS
25.0000 mg | ORAL_CAPSULE | Freq: Once | ORAL | Status: AC
Start: 2012-07-26 — End: 2012-07-26
  Administered 2012-07-26: 25 mg via ORAL
  Filled 2012-07-26: qty 1

## 2012-07-26 MED ORDER — HYDROMORPHONE HCL PF 2 MG/ML IJ SOLN
3.0000 mg | Freq: Once | INTRAMUSCULAR | Status: AC
  Administered 2012-07-26: 3 mg via INTRAVENOUS
  Filled 2012-07-26: qty 2

## 2012-07-26 NOTE — ED Provider Notes (Signed)
I saw and evaluated the patient, reviewed the resident's note and I agree with the findings and plan.  Pain control number department.  Discharge home in good condition  Lyanne Co, MD 07/26/12 [phone]

## 2012-07-26 NOTE — ED Provider Notes (Signed)
History    CSN: [id number] Arrival date & time 07/26/12  2020 First MD Initiated Contact with Patient 07/26/12 2030     Chief Complaint  Patient presents with  . Sickle Cell Pain Crisis   HPI  32 year old male with sickle cell disease presenting with pain in low back and left lower rib pain along axillary line. Similar to pain patient was seen in ED 2 days ago for. His pain was controlled at that time and discharged home. He states pain controlled 24 hours then came back stronger today in same areas. He was taking his ms contin and oral dilaudid but this did not keep his pain from elevating to 8-9/10.  No chest pain or shortness of breath. No recent illness. Patient has been compliant with his Xarelto for history PE. No fever/chills/vomiting.   Past Medical History  Diagnosis Date  . Sickle cell anemia   . Blood transfusion   . 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 Hypokalemia   . Mood disorder   . Pulmonary embolism   . Avascular necrosis   . Leukocytosis     Chronic  . Thrombocytosis     Chronic  . Hypertension    Past Surgical History  Procedure Laterality Date  . Right hip replacement      08/2006  . Cholecystectomy      01/2008  . Porta cath placement    . Porta cath removal    . Umbilical hernia repair      01/2008  . Excision of left periauricular cyst      10/2009  . Excision of right ear lobe cyst with primary closur      11/2007  . Portacath placement  01/05/2012    Procedure: INSERTION PORT-A-CATH;  Surgeon: Adolph Pollack, MD;  Location: [facility] OR;  Service: General;  Laterality: N/A;  ultrasound guiced port a cath insertion with fluoroscopy   Family History  Problem Relation Age of Onset  . Sickle cell anemia Mother   . Sickle cell anemia Father   . Sickle cell trait Brother    History  Substance Use Topics  . Smoking status: Former Smoker -- 13 years    Types: Cigarettes    Quit date: 07/08/2010  . Smokeless tobacco: Never  Used  . Alcohol Use: No    Review of Systems A full 10 point review of symptoms was performed and was negative except as noted in HPI.   Allergies  Morphine and related  Home Medications   Current Outpatient Rx  Name  Route  Sig  Dispense  Refill  . folic acid (FOLVITE) 1 MG tablet   Oral   Take 1 tablet (1 mg total) by mouth every morning.   30 tablet   11   . HYDROmorphone (DILAUDID) 4 MG tablet   Oral   Take 1 tablet (4 mg total) by mouth every 4 (four) hours as needed for pain.   90 tablet   0   . hydroxyurea (HYDREA) 500 MG capsule   Oral   Take 2 capsules (1,000 mg total) by mouth daily. May take with food to minimize GI side effects.   60 capsule   1   . morphine (MS CONTIN) 15 MG 12 hr tablet   Oral   Take 1 tablet (15 mg total) by mouth 2 (two) times daily.   60 tablet   0   . potassium chloride SA (K-DUR,KLOR-CON) 20 MEQ  tablet   Oral   Take 20 mEq by mouth every morning.          . Rivaroxaban (XARELTO) 20 MG TABS   Oral   Take 20 mg by mouth daily.         Marland Kitchen zolpidem (AMBIEN) 10 MG tablet   Oral   Take 1 tablet (10 mg total) by mouth at bedtime as needed. For sleep   30 tablet   0    BP 121/74  Pulse 78  Temp(Src) 99.3 F (37.4 C) (Oral)  Resp 16  Ht 6' (1.829 m)  Wt 173 lb (78.472 kg)  BMI 23.46 kg/m2  SpO2 99% Physical Exam  Constitutional: He is oriented to person, place, and time. He appears well-developed and well-nourished. He appears distressed (appears uncomfortable, moving around in pain).  HENT:  Head: Normocephalic and atraumatic.  Eyes: EOM are normal. Pupils are equal, round, and reactive to light. Scleral icterus is present.  Neck: Normal range of motion. Neck supple.  Cardiovascular: Normal rate and regular rhythm.   Pulmonary/Chest: Effort normal and breath sounds normal. He has no wheezes. He has no rales.  Abdominal: Soft. Bowel sounds are normal. He exhibits no mass. There is no tenderness. There is no rebound  and no guarding.  Musculoskeletal: Normal range of motion. He exhibits tenderness (throughout low back and over left lower ribs near mid axillary line. ). He exhibits no edema.  Neurological: He is alert and oriented to person, place, and time. Coordination normal.  Skin: Skin is warm and dry.    ED Course  Procedures (including critical care time) Labs Reviewed  CBC - Abnormal; Notable for the following:    WBC 17.9 (*)    RBC 2.55 (*)    Hemoglobin 7.8 (*)    HCT 23.2 (*)    RDW 21.0 (*)    Platelets 522 (*)    All other components within normal limits  COMPREHENSIVE METABOLIC PANEL - Abnormal; Notable for the following:    Potassium 3.3 (*)    Glucose, Bld 103 (*)    BUN 5 (*)    Total Bilirubin 4.8 (*)    All other components within normal limits  RETICULOCYTES - Abnormal; Notable for the following:    Retic Ct Pct 10.1 (*)    RBC. 2.55 (*)    Retic Count, Manual 257.6 (*)    All other components within normal limits   No results found. 1. Sickle cell anemia with crisis     MDM  32 year old male with sickle cell disease presenting with pain in low back and left lower rib pain along axillary line. Hgb stable at baseline near 8, reticulocytes elevated as expected. Repleted potassium. After several doses of IV dilaudid, pain controlled down to 2/10 and patient had already called his brother for a ride when I went to discuss further pain control vs. D/c home. Patient wishes to go home at this time and states can control pain at this point with his dilaudid and home ms contin.   Shelva Majestic, MD 07/26/12 2352

## 2012-07-26 NOTE — ED Notes (Signed)
Pt states he was seen here a couple days ago for same and the pain returned yesterday  Pt states pain is in his left side and lower back

## 2012-07-31 ENCOUNTER — Encounter (HOSPITAL_COMMUNITY): Payer: Self-pay | Admitting: Hematology

## 2012-07-31 ENCOUNTER — Telehealth (HOSPITAL_COMMUNITY): Payer: Self-pay | Admitting: Hematology

## 2012-07-31 ENCOUNTER — Non-Acute Institutional Stay (HOSPITAL_COMMUNITY)
Admission: AD | Admit: 2012-07-31 | Discharge: 2012-07-31 | Disposition: A | Discharge status: Home / Self Care | Payer: Medicare Other | Attending: Internal Medicine | Admitting: Internal Medicine

## 2012-07-31 ENCOUNTER — Other Ambulatory Visit: Payer: Self-pay | Admitting: Internal Medicine

## 2012-07-31 DIAGNOSIS — D571 Sickle-cell disease without crisis: Secondary | ICD-10-CM

## 2012-07-31 DIAGNOSIS — Z79899 Other long term (current) drug therapy: Secondary | ICD-10-CM | POA: Insufficient documentation

## 2012-07-31 DIAGNOSIS — M79609 Pain in unspecified limb: Secondary | ICD-10-CM | POA: Diagnosis not present

## 2012-07-31 DIAGNOSIS — D72829 Elevated white blood cell count, unspecified: Secondary | ICD-10-CM | POA: Diagnosis not present

## 2012-07-31 DIAGNOSIS — M545 Low back pain, unspecified: Secondary | ICD-10-CM | POA: Diagnosis not present

## 2012-07-31 DIAGNOSIS — Z885 Allergy status to narcotic agent status: Secondary | ICD-10-CM | POA: Diagnosis not present

## 2012-07-31 DIAGNOSIS — I10 Essential (primary) hypertension: Secondary | ICD-10-CM | POA: Insufficient documentation

## 2012-07-31 DIAGNOSIS — D5700 Hb-SS disease with crisis, unspecified: Secondary | ICD-10-CM | POA: Diagnosis not present

## 2012-07-31 DIAGNOSIS — Z86718 Personal history of other venous thrombosis and embolism: Secondary | ICD-10-CM | POA: Insufficient documentation

## 2012-07-31 DIAGNOSIS — Z87891 Personal history of nicotine dependence: Secondary | ICD-10-CM | POA: Insufficient documentation

## 2012-07-31 DIAGNOSIS — R109 Unspecified abdominal pain: Secondary | ICD-10-CM | POA: Insufficient documentation

## 2012-07-31 DIAGNOSIS — D473 Essential (hemorrhagic) thrombocythemia: Secondary | ICD-10-CM | POA: Diagnosis not present

## 2012-07-31 DIAGNOSIS — Z86711 Personal history of pulmonary embolism: Secondary | ICD-10-CM | POA: Insufficient documentation

## 2012-07-31 DIAGNOSIS — Z7901 Long term (current) use of anticoagulants: Secondary | ICD-10-CM | POA: Diagnosis not present

## 2012-07-31 DIAGNOSIS — F39 Unspecified mood [affective] disorder: Secondary | ICD-10-CM | POA: Diagnosis not present

## 2012-07-31 LAB — CBC WITH DIFFERENTIAL/PLATELET
Basophils Absolute: 0.2 10*3/uL — ABNORMAL HIGH (ref 0.0–0.1)
Eosinophils Absolute: 0.3 10*3/uL (ref 0.0–0.7)
Lymphs Abs: 2.1 10*3/uL (ref 0.7–4.0)
MCH: 30.7 pg (ref 26.0–34.0)
MCHC: 33.9 g/dL (ref 30.0–36.0)
MCV: 90.6 fL (ref 78.0–100.0)
Monocytes Absolute: 2.4 10*3/uL — ABNORMAL HIGH (ref 0.1–1.0)
Neutrophils Relative %: 71 % (ref 43–77)
Platelets: 528 10*3/uL — ABNORMAL HIGH (ref 150–400)
RBC: 2.54 MIL/uL — ABNORMAL LOW (ref 4.22–5.81)
RDW: 20 % — ABNORMAL HIGH (ref 11.5–15.5)

## 2012-07-31 LAB — COMPREHENSIVE METABOLIC PANEL
Albumin: 4.1 g/dL (ref 3.5–5.2)
Alkaline Phosphatase: 81 U/L (ref 39–117)
BUN: 8 mg/dL (ref 6–23)
CO2: 23 mEq/L (ref 19–32)
Chloride: 104 mEq/L (ref 96–112)
Creatinine, Ser: 0.52 mg/dL (ref 0.50–1.35)
GFR calc non Af Amer: >90 mL/min (ref >90)
Potassium: 3.4 mEq/L — ABNORMAL LOW (ref 3.5–5.1)
Total Bilirubin: 5.8 mg/dL — ABNORMAL HIGH (ref 0.3–1.2)

## 2012-07-31 MED ORDER — SODIUM CHLORIDE 0.9 % IJ SOLN
10.0000 mL | INTRAMUSCULAR | Status: AC | PRN
  Administered 2012-07-31: 10 mL

## 2012-07-31 MED ORDER — NALOXONE HCL 1 MG/ML IJ SOLN: INTRAMUSCULAR | Status: DC

## 2012-07-31 MED ORDER — HYDROMORPHONE HCL 4 MG PO TABS: 4.0000 mg | ORAL_TABLET | ORAL | Status: DC | PRN

## 2012-07-31 MED ORDER — RIVAROXABAN 20 MG PO TABS: 20.0000 mg | ORAL_TABLET | Freq: Every day | ORAL | Status: DC

## 2012-07-31 MED ORDER — HEPARIN SOD (PORK) LOCK FLUSH 100 UNIT/ML IV SOLN
500.0000 [IU] | INTRAVENOUS | Status: AC | PRN
  Administered 2012-07-31: 500 [IU]
  Filled 2012-07-31: qty 5

## 2012-07-31 MED ORDER — FOLIC ACID 1 MG PO TABS: 1.0000 mg | ORAL_TABLET | Freq: Every morning | ORAL | Status: DC

## 2012-07-31 MED ORDER — KETOROLAC TROMETHAMINE 30 MG/ML IJ SOLN
30.0000 mg | Freq: Once | INTRAMUSCULAR | Status: AC
  Administered 2012-07-31: 30 mg via INTRAVENOUS
  Filled 2012-07-31: qty 1

## 2012-07-31 MED ORDER — HYDROMORPHONE HCL PF 2 MG/ML IJ SOLN
4.0000 mg | Freq: Once | INTRAMUSCULAR | Status: AC
  Administered 2012-07-31: 4 mg via INTRAVENOUS
  Filled 2012-07-31: qty 2

## 2012-07-31 MED ORDER — CELECOXIB 200 MG PO CAPS: 200.0000 mg | ORAL_CAPSULE | Freq: Two times a day (BID) | ORAL | Status: DC | PRN

## 2012-07-31 MED ORDER — POTASSIUM CHLORIDE CRYS ER 20 MEQ PO TBCR: 20.0000 meq | EXTENDED_RELEASE_TABLET | Freq: Every morning | ORAL | Status: DC

## 2012-07-31 MED ORDER — HYDROMORPHONE HCL PF 2 MG/ML IJ SOLN
2.0000 mg | INTRAMUSCULAR | Status: DC | PRN
  Administered 2012-07-31 (×2): 4 mg via INTRAVENOUS
  Filled 2012-07-31 (×2): qty 2

## 2012-07-31 MED ORDER — MORPHINE SULFATE ER 15 MG PO TBCR: 15.0000 mg | EXTENDED_RELEASE_TABLET | Freq: Two times a day (BID) | ORAL | Status: DC

## 2012-07-31 MED ORDER — DIPHENHYDRAMINE HCL 25 MG PO CAPS
25.0000 mg | ORAL_CAPSULE | ORAL | Status: DC | PRN
  Administered 2012-07-31: 25 mg via ORAL
  Filled 2012-07-31: qty 1

## 2012-07-31 MED ORDER — HYDROXYUREA 500 MG PO CAPS: 1000.0000 mg | ORAL_CAPSULE | Freq: Every day | ORAL | Status: DC

## 2012-07-31 MED ORDER — HYDROMORPHONE HCL PF 2 MG/ML IJ SOLN
4.0000 mg | INTRAMUSCULAR | Status: AC
  Administered 2012-07-31: 4 mg via INTRAVENOUS
  Filled 2012-07-31: qty 2

## 2012-07-31 MED ORDER — DEXTROSE-NACL 5-0.45 % IV SOLN
INTRAVENOUS | Status: DC
  Administered 2012-07-31: 13:00:00 via INTRAVENOUS

## 2012-07-31 MED ORDER — SODIUM CHLORIDE 0.9 % IV SOLN
25.0000 mg | INTRAVENOUS | Status: DC | PRN
  Filled 2012-07-31: qty 0.5

## 2012-07-31 NOTE — Discharge Summary (Signed)
Physician Discharge Summary  [name]:[id number] DOB: 12-19-1980 DOA: 07/31/2012  PCP: Rosalynd Mcwright A., MD  Admit date: 07/31/2012 Discharge date: 07/31/2012  Discharge Diagnoses:  Active Problems:   Hb SS with Crisis   Discharge Condition: Stable  Disposition:  home  Diet: Heart healthy diet Wt Readings from Last 3 Encounters:  07/26/12 173 lb (78.472 kg)  07/24/12 173 lb (78.472 kg)  07/09/12 175 lb (79.379 kg)    History of present illness:  This a patient well known to me his hemoglobin SS. 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 The patient describes the pain as throbbing in nature and intensity of 9/10. The pain is nonradiating and not accompanied by any of the symptoms. He is unable to identify any palliative features but states that his pain was provoked by his long car ride and his increase in activity since he has been traveling and assisting opening barbershops.    Hospital Course:  Patient was admitted in early vaso-occlusive crisis. He was treated with aggressive hydration with IV fluids started on scheduled IV analgesics. Once his renal function was verified as being within normal limits he was treated with Toradol and received one dose 30 mg IV x1. The patient had a significant decrease in his pain and was ambulatory in her function within normal limits at the time of discharge. The patient is being discharged home on Celebrex, MS Contin and Dilaudid to be taken on an as-needed basis. He is being issued a prescription for Dilaudid 4 mg #90 pills which is the usual 15 day supply. Patient reported his pain to be a 5/10 at the time of discharge.  Discharge Exam: Filed Vitals:   07/31/12 1733  BP:   Pulse: 84  Temp:   Resp: 22   Filed Vitals:   07/31/12 1325  07/31/12 1500 07/31/12 1703 07/31/12 1733  BP:   118/70   Pulse: 75 88 89 84  Temp:   98.9 F (37.2 C)   TempSrc:   Tympanic   Resp: 13 20 20 22   SpO2: 98% 99% 99% 98%   General: Alert, awake, oriented x3, in no apparent distress.  HEENT: [place]/AT PEERL, EOMI, anicteric  OROPHARYNX: Moist, No exudate/ erythema/lesions.  Heart: Regular rate and rhythm, without murmurs, rubs, gallops.  Lungs: Clear to auscultation, no wheezing or rhonchi noted.  Abdomen: Soft, nontender, nondistended, positive bowel sounds, no masses no hepatosplenomegaly noted.  Neuro: No focal neurological deficits noted cranial nerves II through XII grossly intact. DTRs 2+ bilaterally upper and lower extremities. Strength functional in bilateral upper and lower extremities.  Musculoskeletal: No warm swelling or erythema around joints, no spinal tenderness noted.  Psychiatric: Patient alert and oriented x3, good insight and cognition, good recent to remote recall.   Discharge Instructions: Pt instructed in proper use of naloxone kit. He's also been instructed to maintain hydration and to balance rest and activity     Medication List         celecoxib 200 MG capsule  Commonly known as:  CELEBREX  Take 1 capsule (200 mg total) by mouth 2 (two) times daily as needed for pain.     folic acid 1 MG tablet  Commonly known as:  FOLVITE  Take 1 tablet (1 mg total) by mouth every  morning.     HYDROmorphone 4 MG tablet  Commonly known as:  DILAUDID  Take 1 tablet (4 mg total) by mouth every 4 (four) hours as needed for pain.     hydroxyurea 500 MG capsule  Commonly known as:  HYDREA  Take 2 capsules (1,000 mg total) by mouth daily. May take with food to minimize GI side effects.     morphine 15 MG 12 hr tablet  Commonly known as:  MS CONTIN  Take 1 tablet (15 mg total) by mouth 2 (two) times daily.     naloxone 1 MG/ML injection  Commonly known as:  NARCAN  - Route: Intrnasal  - Naloxone 2 mg/ml pre-filled  syringe with naloxone kit.   - Disp: #4 ml (2 Syringes)  - Sig: For suspected opioid overdose, spray 1 ml in each nostril as directed. Repeat after 3 minutes if no or minimal response     potassium chloride SA 20 MEQ tablet  Commonly known as:  K-DUR,KLOR-CON  Take 20 mEq by mouth every morning.     XARELTO 20 MG Tabs  Generic drug:  Rivaroxaban  Take 20 mg by mouth daily.     zolpidem 10 MG tablet  Commonly known as:  AMBIEN  Take 1 tablet (10 mg total) by mouth at bedtime as needed. For sleep          The results of significant diagnostics from this hospitalization (including imaging, microbiology, ancillary and laboratory) are listed below for reference.    Significant Diagnostic Studies: Ct Angio Chest Pe W/cm &/or Wo Cm  07/09/2012   *RADIOLOGY REPORT*  Clinical Data:  Pleuritic left chest pain beginning today, cough, question pulmonary embolism; history sickle cell crisis, pulmonary embolism, hypertension  CT ANGIOGRAPHY CHEST  Technique:  Multidetector CT imaging of the chest using the standard protocol during bolus administration of intravenous contrast. Multiplanar reconstructed images including MIPs were obtained and reviewed to evaluate the vascular anatomy.  Contrast: OMNIPAQUE IOHEXOL 350 MG/ML SOLN  Comparison: 02/20/2012  Findings: Diffuse thyroid enlargement. Left subclavian Port-A-Cath with tip within the right atrium. Aorta normal caliber without aneurysm or dissection. Small filling defects identified within bilateral upper lobe pulmonary arteries consistent with new pulmonary emboli. Filling defects in right lower lobe pulmonary artery similar to previous exam. Filling defects identified left lower lobe pulmonary arteries, new.  Mildly enlarged right hilar lymph node 19 x 16 mm image 45. Multiple normal upper normal-sized anterior mediastinal nodes again seen. Enlarged azygo esophageal recess node 2.2 x 2.0 cm image 52. Small calcified spleen. Visualized portion of  liver appears dense, can be seen with multiple etiologies but in a sickle cell patient who has received prior transfusions question iron overload or hemochromatosis.  Heart appears enlarged. Scattered areas of parenchymal lung scarring with bibasilar atelectasis as well as peripheral triangular density in the left upper lobe question related to pulmonary embolus. No definite acute infiltrate pleural effusion or pneumothorax. 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  IMPRESSION: New pulmonary emboli identified in bilateral upper lobe and left lower lobe pulmonary arteries. Enlargement of cardiac silhouette consistent with sickle cell disease. Scattered areas of atelectasis with triangular peripheral opacity in the left upper lobe which could be related to a pulmonary embolus. Dense liver question hemochromatosis versus iron overload. Nonspecific enlarged right hilar and azygo esophageal recess lymph nodes.  Critical Value/emergent results were called by telephone at the time of interpretation on  07/09/2012 at 1423 hours to Dr. Ethelda Chick, who verbally acknowledged these results.   Original Report Authenticated By: Ulyses Southward, M.D.    Microbiology: No results found for this or any previous visit (from the past 240 hour(s)).   Labs: Basic Metabolic Panel:  Recent Labs Lab 07/25/12 0014 07/26/12 2126 07/31/12 1252  NA 138 136 137  K 3.3* 3.3* 3.4*  CL 104 103 104  CO2 25 25 23   GLUCOSE 114* 103* 104*  BUN 6 5* 8  CREATININE 0.55 0.59 0.52  CALCIUM 8.9 8.7 9.3   Liver Function Tests:  Recent Labs Lab 07/25/12 0014 07/26/12 2126 07/31/12 1252  AST 25 25 31   ALT 14 14 12   ALKPHOS 83 84 81  BILITOT 4.9* 4.8* 5.8*  PROT 7.5 7.3 7.9  ALBUMIN 3.7 3.6 4.1   No results found for this basename: LIPASE, AMYLASE,  in the last 168 hours No results found for this basename: AMMONIA,  in the last 168  hours CBC:  Recent Labs Lab 07/25/12 0014 07/26/12 2126 07/31/12 1252  WBC 18.4* 17.9* 17.4*  NEUTROABS 12.3*  --  12.4*  HGB 7.9* 7.8* 7.8*  HCT 23.5* 23.2* 23.0*  MCV 91.1 91.0 90.6  PLT 554* 522* 528*   Cardiac Enzymes: No results found for this basename: CKTOTAL, CKMB, CKMBINDEX, TROPONINI,  in the last 168 hours BNP: No components found with this basename: POCBNP,  CBG: No results found for this basename: GLUCAP,  in the last 168 hours Ferritin: No results found for this basename: FERRITIN,  in the last 168 hours  Time coordinating discharge: 41 minutes  Signed:  Florella Mcneese A.  07/31/2012, 6:55 PM

## 2012-07-31 NOTE — Telephone Encounter (Signed)
Spoke with Dr.[name], called patient back and advised that it is ok for him to come to the day hospital.  Patient verbalizes understanding

## 2012-07-31 NOTE — Telephone Encounter (Signed)
Patient called C/O pain to back, ribs, and legs that is 9/10.  Has been going on since yesterday.  Patient states he has taken his pain medication as prescribed without any relief.  I advised I would notify the physician and given him a call back.  Patient verbalizes understanding.

## 2012-07-31 NOTE — H&P (Signed)
[facility] History and Physical  [name] [name] DOB: Jun 08, 1980 DOA: 07/31/2012   PCP: Tishina Lown A., MD   Chief Complaint: Patient back, side and legs x1 day  HPI: This a patient well known to me his hemoglobin SS. 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 The patient describes the pain as throbbing in nature and intensity of 9/10. The pain is nonradiating and not accompanied by any of the symptoms. He is unable to identify any palliative or provocative features.    Review of Systems:  Constitutional: No weight loss, night sweats, Fevers, chills, fatigue.  HEENT: No headaches, dizziness, seizures, vision changes, difficulty swallowing,Tooth/dental problems,Sore throat, No sneezing, itching, ear ache, nasal congestion, post nasal drip,  Cardio-vascular: No chest pain, Orthopnea, PND, swelling in lower extremities, anasarca, dizziness, palpitations  GI: No heartburn, indigestion, abdominal pain, nausea, vomiting, diarrhea, change in bowel habits, loss of appetite  Resp: No shortness of breath with exertion or at rest. No excess mucus, no productive cough, No non-productive cough, No coughing up of blood.No change in color of mucus.No wheezing.No chest wall deformity  Skin: no rash or lesions.  GU: no dysuria, change in color of urine, no urgency or frequency. No flank pain.  Psych: No change in mood or affect. No depression or anxiety. No memory loss.    Past Medical History  Diagnosis Date  . Sickle cell anemia   . Blood transfusion   . 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 Hypokalemia   . Mood disorder   . Pulmonary embolism   . Avascular necrosis   . Leukocytosis     Chronic  . Thrombocytosis     Chronic  . Hypertension     Past Surgical History  Procedure Laterality Date  . Right hip replacement      08/2006  . Cholecystectomy      01/2008  . Porta cath placement    . Porta cath removal    . Umbilical hernia repair      01/2008  . Excision of left periauricular cyst      10/2009  . Excision of right ear lobe cyst with primary closur      11/2007  . Portacath placement  01/05/2012    Procedure: INSERTION PORT-A-CATH;  Surgeon: Adolph Pollack, MD;  Location: [facility] OR;  Service: General;  Laterality: N/A;  ultrasound guiced port a cath insertion with fluoroscopy   Social History:  reports that he quit smoking about 2 years ago. His smoking use included Cigarettes. He smoked 0.00 packs per day for 13 years. He has never used smokeless tobacco. He reports that he does not drink alcohol or use illicit drugs.  Allergies  Allergen Reactions  . Morphine And Related Hives and Rash    Pt states he is able to tolerate Dilaudid with no reactions.    Family History  Problem Relation Age of Onset  . Sickle cell anemia Mother   . Sickle cell anemia Father   . Sickle cell trait Brother     Prior to Admission medications   Medication Sig Start Date End Date Taking? Authorizing Provider  folic acid (FOLVITE) 1 MG tablet Take 1 tablet (1 mg total) by mouth every morning. 06/05/12  Yes  Altha Harm, MD  HYDROmorphone (DILAUDID) 4 MG tablet Take 1 tablet (4 mg total) by mouth every 4 (four) hours as needed for pain. 07/12/12  Yes Altha Harm, MD  hydroxyurea (HYDREA) 500 MG capsule Take 2 capsules (1,000 mg total) by mouth daily. May take with food to minimize GI side effects. 07/12/12  Yes Altha Harm, MD  morphine (MS CONTIN) 15 MG 12 hr tablet Take 1 tablet (15 mg total) by mouth 2 (two) times daily. 07/12/12  Yes Altha Harm, MD  potassium chloride SA (K-DUR,KLOR-CON) 20 MEQ tablet Take 20 mEq by mouth every morning.  01/04/12  Yes Gwenyth Bender, MD  Rivaroxaban (XARELTO) 20 MG TABS Take 20  mg by mouth daily.   Yes Historical Provider, MD  zolpidem (AMBIEN) 10 MG tablet Take 1 tablet (10 mg total) by mouth at bedtime as needed. For sleep 07/12/12  Yes Altha Harm, MD   Physical Exam: Filed Vitals:   07/31/12 1219  BP: 120/71  Pulse: 81  Temp: 99 F (37.2 C)  TempSrc: Oral  Resp: 18  SpO2: 97%   BP 120/71  Pulse 86  Temp(Src) 99 F (37.2 C) (Oral)  Resp 19  SpO2 96%  General Appearance:    Alert, cooperative, no distress, appears stated age  Head:    Normocephalic, without obvious abnormality, atraumatic  Eyes:    PERRL, conjunctiva/corneas clear, EOM's intact, fundi    benign, both eyes. Anicteric      Throat:   Lips, mucosa, and tongue normal; teeth and gums normal  Neck:   Supple, symmetrical, trachea midline, no adenopathy;       thyroid:  No enlargement/tenderness/nodules; no carotid   bruit or JVD  Back:     Symmetric, no curvature, ROM normal, no CVA tenderness  Lungs:     Clear to auscultation bilaterally, respirations unlabored  Chest wall:    No tenderness or deformity  Heart:    Regular rate and rhythm, S1 and S2 normal, no murmur, rub   or gallop  Abdomen:     Soft, non-tender, bowel sounds active all four quadrants,    no masses, no organomegaly  Extremities:   Extremities normal, atraumatic, no cyanosis or edema  Pulses:   2+ and symmetric all extremities  Skin:   Skin color, texture, turgor normal, no rashes or lesions  Lymph nodes:   Cervical, supraclavicular, and axillary nodes normal  Neurologic:   CNII-XII intact. Normal strength, sensation and reflexes      throughout    Labs on Admission:   Basic Metabolic Panel:  Recent Labs Lab 07/25/12 0014 07/26/12 2126  NA 138 136  K 3.3* 3.3*  CL 104 103  CO2 25 25  GLUCOSE 114* 103*  BUN 6 5*  CREATININE 0.55 0.59  CALCIUM 8.9 8.7   Liver Function Tests:  Recent Labs Lab 07/25/12 0014 07/26/12 2126  AST 25 25  ALT 14 14  ALKPHOS 83 84  BILITOT 4.9* 4.8*  PROT 7.5 7.3   ALBUMIN 3.7 3.6   CBC:  Recent Labs Lab 07/25/12 0014 07/26/12 2126  WBC 18.4* 17.9*  NEUTROABS 12.3*  --   HGB 7.9* 7.8*  HCT 23.5* 23.2*  MCV 91.1 91.0  PLT 554* 522*    Assessment/Plan: Active Problems:  Patient presented with early acute vaso-occlusive episode. I will aggressively hydrate the patient with D5 0.4 505 mL there are periods of her scheduled doses of analgesia by IV and then on  a when necessary basis. Once renal function has been evaluated we'll at ketorolac or anti-inflammatory component. Heat as adjunctive therapy and reevaluate the patient for further decision making.   Time spend: 25 minutes Code Status: Full Code Family Communication: N/A Disposition Plan: Anticipate home after treatment  Tracee Mccreery A., MD  Pager [phone]  If 7PM-7AM, please contact night-coverage [email or web address] Password [facility] 07/31/2012, 12:51 PM

## 2012-07-31 NOTE — Progress Notes (Signed)
Patient ID: [name], male   DOB: February 23, 1980, 32 y.o.   MRN: [id number] Discharge instructions given to patient, port de accessed and flushed per protocol.  Prescriptions given to patient for narcan, dilaudid, and celebrex.  Patient is awaiting transportation.

## 2012-08-01 ENCOUNTER — Telehealth (HOSPITAL_COMMUNITY): Payer: Self-pay | Admitting: Hematology

## 2012-08-01 ENCOUNTER — Encounter (HOSPITAL_COMMUNITY): Payer: Self-pay | Admitting: Hematology

## 2012-08-01 ENCOUNTER — Non-Acute Institutional Stay (HOSPITAL_COMMUNITY)
Admission: AD | Admit: 2012-08-01 | Discharge: 2012-08-01 | Disposition: A | Discharge status: Home / Self Care | Payer: Medicare Other | Attending: Internal Medicine | Admitting: Internal Medicine

## 2012-08-01 DIAGNOSIS — Z79899 Other long term (current) drug therapy: Secondary | ICD-10-CM | POA: Diagnosis not present

## 2012-08-01 DIAGNOSIS — I10 Essential (primary) hypertension: Secondary | ICD-10-CM

## 2012-08-01 DIAGNOSIS — M79609 Pain in unspecified limb: Secondary | ICD-10-CM | POA: Insufficient documentation

## 2012-08-01 DIAGNOSIS — M549 Dorsalgia, unspecified: Secondary | ICD-10-CM | POA: Diagnosis not present

## 2012-08-01 DIAGNOSIS — D571 Sickle-cell disease without crisis: Secondary | ICD-10-CM | POA: Diagnosis not present

## 2012-08-01 DIAGNOSIS — D5700 Hb-SS disease with crisis, unspecified: Secondary | ICD-10-CM

## 2012-08-01 LAB — COMPREHENSIVE METABOLIC PANEL
ALT: 13 U/L (ref 0–53)
AST: 27 U/L (ref 0–37)
Calcium: 8.8 mg/dL (ref 8.4–10.5)
Creatinine, Ser: 0.52 mg/dL (ref 0.50–1.35)
GFR calc Af Amer: >90 mL/min (ref >90)
Sodium: 139 mEq/L (ref 135–145)
Total Protein: 7.6 g/dL (ref 6.0–8.3)

## 2012-08-01 LAB — CBC WITH DIFFERENTIAL/PLATELET
Basophils Absolute: 0.2 10*3/uL — ABNORMAL HIGH (ref 0.0–0.1)
Eosinophils Absolute: 0.5 10*3/uL (ref 0.0–0.7)
Eosinophils Relative: 3 % (ref 0–5)
MCH: 30.9 pg (ref 26.0–34.0)
MCHC: 34.1 g/dL (ref 30.0–36.0)
MCV: 90.7 fL (ref 78.0–100.0)
Platelets: 511 10*3/uL — ABNORMAL HIGH (ref 150–400)
RDW: 20.8 % — ABNORMAL HIGH (ref 11.5–15.5)

## 2012-08-01 MED ORDER — SODIUM CHLORIDE 0.9 % IJ SOLN: 10.0000 mL | INTRAMUSCULAR | Status: DC | PRN

## 2012-08-01 MED ORDER — HYDROXYUREA 500 MG PO CAPS: 1000.0000 mg | ORAL_CAPSULE | Freq: Every day | ORAL | Status: DC

## 2012-08-01 MED ORDER — DEXTROSE-NACL 5-0.45 % IV SOLN
INTRAVENOUS | Status: DC
  Administered 2012-08-01: 12:00:00 via INTRAVENOUS

## 2012-08-01 MED ORDER — POTASSIUM CHLORIDE CRYS ER 20 MEQ PO TBCR: 20.0000 meq | EXTENDED_RELEASE_TABLET | Freq: Every morning | ORAL | Status: DC

## 2012-08-01 MED ORDER — MORPHINE SULFATE ER 15 MG PO TBCR
15.0000 mg | EXTENDED_RELEASE_TABLET | Freq: Two times a day (BID) | ORAL | Status: DC
  Administered 2012-08-01: 15 mg via ORAL
  Filled 2012-08-01: qty 1

## 2012-08-01 MED ORDER — HYDROMORPHONE HCL PF 2 MG/ML IJ SOLN
4.0000 mg | INTRAMUSCULAR | Status: DC | PRN
  Administered 2012-08-01 (×4): 4 mg via INTRAVENOUS
  Filled 2012-08-01 (×5): qty 2

## 2012-08-01 MED ORDER — HYDROMORPHONE HCL 4 MG PO TABS
4.0000 mg | ORAL_TABLET | Freq: Once | ORAL | Status: AC
  Administered 2012-08-01: 4 mg via ORAL
  Filled 2012-08-01: qty 1

## 2012-08-01 MED ORDER — HEPARIN SOD (PORK) LOCK FLUSH 100 UNIT/ML IV SOLN
500.0000 [IU] | INTRAVENOUS | Status: DC | PRN
  Filled 2012-08-01: qty 5

## 2012-08-01 MED ORDER — FOLIC ACID 1 MG PO TABS: 1.0000 mg | ORAL_TABLET | Freq: Every morning | ORAL | Status: DC

## 2012-08-01 MED ORDER — KETOROLAC TROMETHAMINE 30 MG/ML IJ SOLN
30.0000 mg | Freq: Once | INTRAMUSCULAR | Status: AC
  Administered 2012-08-01: 30 mg via INTRAVENOUS
  Filled 2012-08-01: qty 1

## 2012-08-01 MED ORDER — GABAPENTIN 100 MG PO CAPS
300.0000 mg | ORAL_CAPSULE | Freq: Every day | ORAL | Status: DC
  Administered 2012-08-01: 300 mg via ORAL
  Filled 2012-08-01: qty 3

## 2012-08-01 MED ORDER — DIPHENHYDRAMINE HCL 50 MG/ML IJ SOLN
25.0000 mg | Freq: Once | INTRAMUSCULAR | Status: AC
  Administered 2012-08-01: 25 mg via INTRAVENOUS
  Filled 2012-08-01: qty 1

## 2012-08-01 MED ORDER — GABAPENTIN 300 MG PO CAPS: 300.0000 mg | ORAL_CAPSULE | Freq: Every day | ORAL | Status: DC

## 2012-08-01 MED ORDER — RIVAROXABAN 20 MG PO TABS
20.0000 mg | ORAL_TABLET | Freq: Every day | ORAL | Status: DC
Start: 2012-08-01 — End: 2012-08-01

## 2012-08-01 NOTE — Care Management (Signed)
Patient: DORMAN CALDERWOOD DOB :1980-11-02 MRN :[id number]  Date: 08/01/2012  Documentation Initiated by : Jefm Miles  Subjective/Objective Assessment: Mr. Pittsley is a 32 year old male with known SCD. Mr. Ohlrich is in the day hospital today due to [facility] pain crisis. Mr. Lucarelli stated he is enjoying his life, he is working as a Paediatric nurse. Mr.Pujol states he is taking his Exjade and hydroxyurea.   Barriers to Care: None at this time   Prior Approval (PA) #: PA start date: PA end date:    Action/Plan: This CM gave her contact information and will continue to monitor.    Comments: Mr. Benish stated he does not have an questions or concerns today.  Time spent: 15 minutes  Cordelia Pen, BSN, [place]    [phone]

## 2012-08-01 NOTE — Telephone Encounter (Signed)
Patient called and C/O pain back up to 8/10 in his back.  Has taken pain medications as prescribed.  Advised I would notify Dr. Ashley Royalty and call him back.

## 2012-08-01 NOTE — Telephone Encounter (Signed)
Called patient and advised that per Dr. Ashley Royalty, he can come to the [facility].  Patient verbalizes understanding.

## 2012-08-01 NOTE — Progress Notes (Signed)
Patient ID: [name], male   DOB: 05/23/1980, 32 y.o.   MRN: [id number] Discharge instructions given to patient, port de accessed and flushed per protocol, belonging at bedside.  Patient is waiting for his ride.

## 2012-08-01 NOTE — H&P (Signed)
[facility] History and Physical  [name] [name] DOB: 1980-07-04 DOA: 08/01/2012   PCP: [name],[name], MD   Chief Complaint: Continued Pain in back and legs  HPI: This a patient who was seen in the [facility] yesterday for acute management of symptoms. The patient had some relief of pain but is still having significant pain today that is inadequately relieved by oral analgesics. The pain is typical of his pain of sickle cell crisis localized to the back, right flank and legs. The patient describes the pain as throbbing in nature and intensity of 7/10. The pain is nonradiating and not accompanied by any of the symptoms. He is unable to identify any palliative or provocative features    Review of Systems:  Constitutional: No weight loss, night sweats, Fevers, chills, fatigue.  HEENT: No headaches, dizziness, seizures, vision changes, difficulty swallowing,Tooth/dental problems,Sore throat, No sneezing, itching, ear ache, nasal congestion, post nasal drip,  Cardio-vascular: No chest pain, Orthopnea, PND, swelling in lower extremities, anasarca, dizziness, palpitations  GI: No heartburn, indigestion, abdominal pain, nausea, vomiting, diarrhea, change in bowel habits, loss of appetite  Resp: No shortness of breath with exertion or at rest. No excess mucus, no productive cough, No non-productive cough, No coughing up of blood.No change in color of mucus.No wheezing.No chest wall deformity  Skin: no rash or lesions.  GU: no dysuria, change in color of urine, no urgency or frequency. No flank pain.  Musculoskeletal: No joint pain or swelling. No decreased range of motion. No back pain.  Psych: No change in mood or affect. No depression or anxiety. No memory loss.    Past Medical History  Diagnosis Date  . Sickle cell anemia   . Blood transfusion   . 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 Hypokalemia   . Mood disorder   .  Pulmonary embolism   . Avascular necrosis   . Leukocytosis     Chronic  . Thrombocytosis     Chronic  . Hypertension    Past Surgical History  Procedure Laterality Date  . Right hip replacement      08/2006  . Cholecystectomy      01/2008  . Porta cath placement    . Porta cath removal    . Umbilical hernia repair      01/2008  . Excision of left periauricular cyst      10/2009  . Excision of right ear lobe cyst with primary closur      11/2007  . Portacath placement  01/05/2012    Procedure: INSERTION PORT-A-CATH;  Surgeon: Adolph Pollack, MD;  Location: [facility] OR;  Service: General;  Laterality: N/A;  ultrasound guiced port a cath insertion with fluoroscopy   Social History:  reports that he quit smoking about 2 years ago. His smoking use included Cigarettes. He smoked 0.00 packs per day for 13 years. He has never used smokeless tobacco. He reports that he does not drink alcohol or use illicit drugs.  Allergies  Allergen Reactions  . Morphine And Related Hives and Rash    Pt states he is able to tolerate Dilaudid with no reactions.    Family History  Problem Relation Age of Onset  . Sickle cell anemia Mother   . Sickle cell anemia Father   . Sickle cell trait Brother     Prior to Admission medications   Medication Sig Start Date End Date Taking? Authorizing Provider  celecoxib (CELEBREX) 200  MG capsule Take 1 capsule (200 mg total) by mouth 2 (two) times daily as needed for pain. 07/31/12  Yes Altha Harm, MD  folic acid (FOLVITE) 1 MG tablet Take 1 tablet (1 mg total) by mouth every morning. 06/05/12  Yes Altha Harm, MD  HYDROmorphone (DILAUDID) 4 MG tablet Take 1 tablet (4 mg total) by mouth every 4 (four) hours as needed for pain. 07/31/12  Yes Altha Harm, MD  hydroxyurea (HYDREA) 500 MG capsule Take 2 capsules (1,000 mg total) by mouth daily. May take with food to minimize GI side effects. 07/12/12  Yes Altha Harm, MD  morphine (MS CONTIN)  15 MG 12 hr tablet Take 1 tablet (15 mg total) by mouth 2 (two) times daily. 07/12/12  Yes Altha Harm, MD  potassium chloride SA (K-DUR,KLOR-CON) 20 MEQ tablet Take 1 tablet (20 mEq total) by mouth every morning. 07/31/12  Yes Altha Harm, MD  Rivaroxaban (XARELTO) 20 MG TABS Take 20 mg by mouth daily.   Yes Historical Provider, MD  zolpidem (AMBIEN) 10 MG tablet Take 1 tablet (10 mg total) by mouth at bedtime as needed. For sleep 07/12/12  Yes Altha Harm, MD  gabapentin (NEURONTIN) 300 MG capsule Take 1 capsule (300 mg total) by mouth daily. 08/01/12   Altha Harm, MD  naloxone [facility]) 1 MG/ML injection Route: Intrnasal Naloxone 2 mg/ml pre-filled syringe with naloxone kit.  Disp: #4 ml (2 Syringes) Sig: For suspected opioid overdose, spray 1 ml in each nostril as directed. Repeat after 3 minutes if no or minimal response 07/31/12   Altha Harm, MD   Physical Exam: Filed Vitals:   08/01/12 1315 08/01/12 1457 08/01/12 1528 08/01/12 1702  BP:  114/72    Pulse: 60 62 79 65  Temp:  98.4 F (36.9 C)    TempSrc:  Oral    Resp: 19 17 21 19   Height:      Weight:      SpO2: 98% 100% 98% 100%   BP 114/72  Pulse 65  Temp(Src) 98.4 F (36.9 C) (Oral)  Resp 19  Ht 6' (1.829 m)  Wt 173 lb (78.472 kg)  BMI 23.46 kg/m2  SpO2 100%  General Appearance:    Alert, cooperative, no distress, appears stated age  Eyes:    PERRL, conjunctiva/corneas clear, EOM's intact, fundi    benign, both eyes, anicteric      Back:     Symmetric, no curvature, ROM normal, no CVA tenderness  Lungs:     Clear to auscultation bilaterally, respirations unlabored  Chest wall:    No tenderness or deformity  Heart:    Regular rate and rhythm, S1 and S2 normal, no murmur, rub   or gallop  Abdomen:     Soft, non-tender, bowel sounds active all four quadrants,    no masses, no organomegaly  Extremities:   Extremities normal, atraumatic, no cyanosis or edema  Pulses:   2+ and symmetric  all extremities  Skin:   Skin color, texture, turgor normal, no rashes or lesions  Lymph nodes:   Cervical, supraclavicular, and axillary nodes normal  Neurologic:   CNII-XII intact. Normal strength, sensation and reflexes      throughout    Labs on Admission:   Basic Metabolic Panel:  Recent Labs Lab 07/26/12 2126 07/31/12 1252 08/01/12 1227  NA 136 137 139  K 3.3* 3.4* 3.4*  CL 103 104 106  CO2 25 23 24   GLUCOSE  103* 104* 95  BUN 5* 8 9  CREATININE 0.59 0.52 0.52  CALCIUM 8.7 9.3 8.8   Liver Function Tests:  Recent Labs Lab 07/26/12 2126 07/31/12 1252 08/01/12 1227  AST 25 31 27   ALT 14 12 13   ALKPHOS 84 81 81  BILITOT 4.8* 5.8* 5.1*  PROT 7.3 7.9 7.6  ALBUMIN 3.6 4.1 3.8   CBC:  Recent Labs Lab 07/26/12 2126 07/31/12 1252 08/01/12 1227  WBC 17.9* 17.4* 16.2*  NEUTROABS  --  12.4* 9.9*  HGB 7.8* 7.8* 7.3*  HCT 23.2* 23.0* 21.4*  MCV 91.0 90.6 90.7  PLT 522* 528* 511*   B  Assessment/Plan: Active Problems: Patient presenting with early acute vaso-occlusive episode which still persists even after aggressive treatment yesterday. I will aggressively hydrate the patient with D5 0.45 at 125 mL/hr. Analgesia by IV  on a when necessary basis and  ketorolac for anti-inflammatory component. Heat as adjunctive therapy and reevaluate the patient for further decision making.       Time spend: 35 minutes Code Status: Full Code Family Communication: N/A Disposition Plan: Home   [name],[name], MD  Pager [phone]  If 7PM-7AM, please contact night-coverage [email or web address] Password [email or web address] 08/01/2012, 7:00 PM

## 2012-08-02 IMAGING — CR DG CHEST 2V
2 series · 2 of 2 positions shown · non-contrast
Comparison: 09/02/2009

CLINICAL DATA: Cough/sickle cell disease

CHEST - 2 VIEW

[w chest pa]
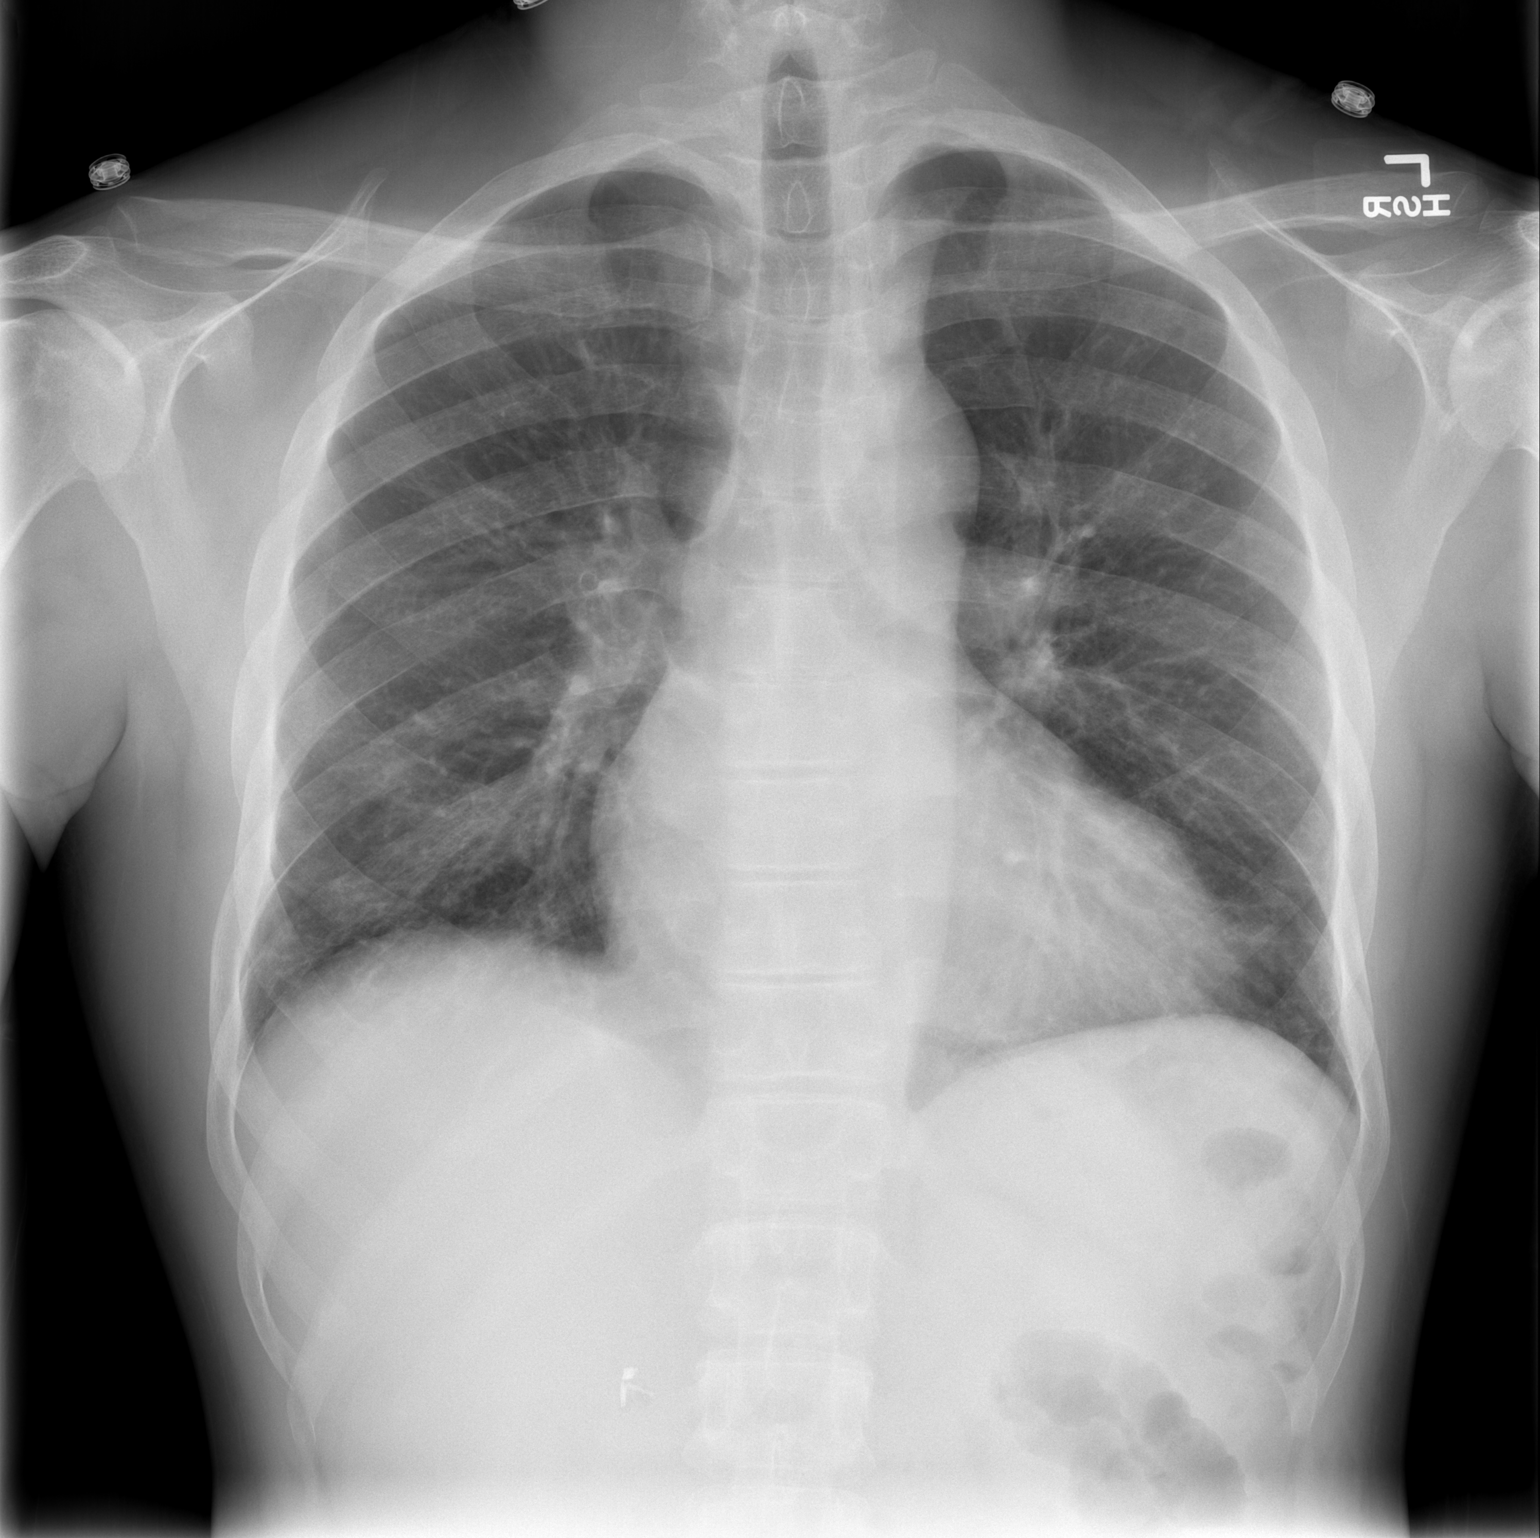

[w chest lat]
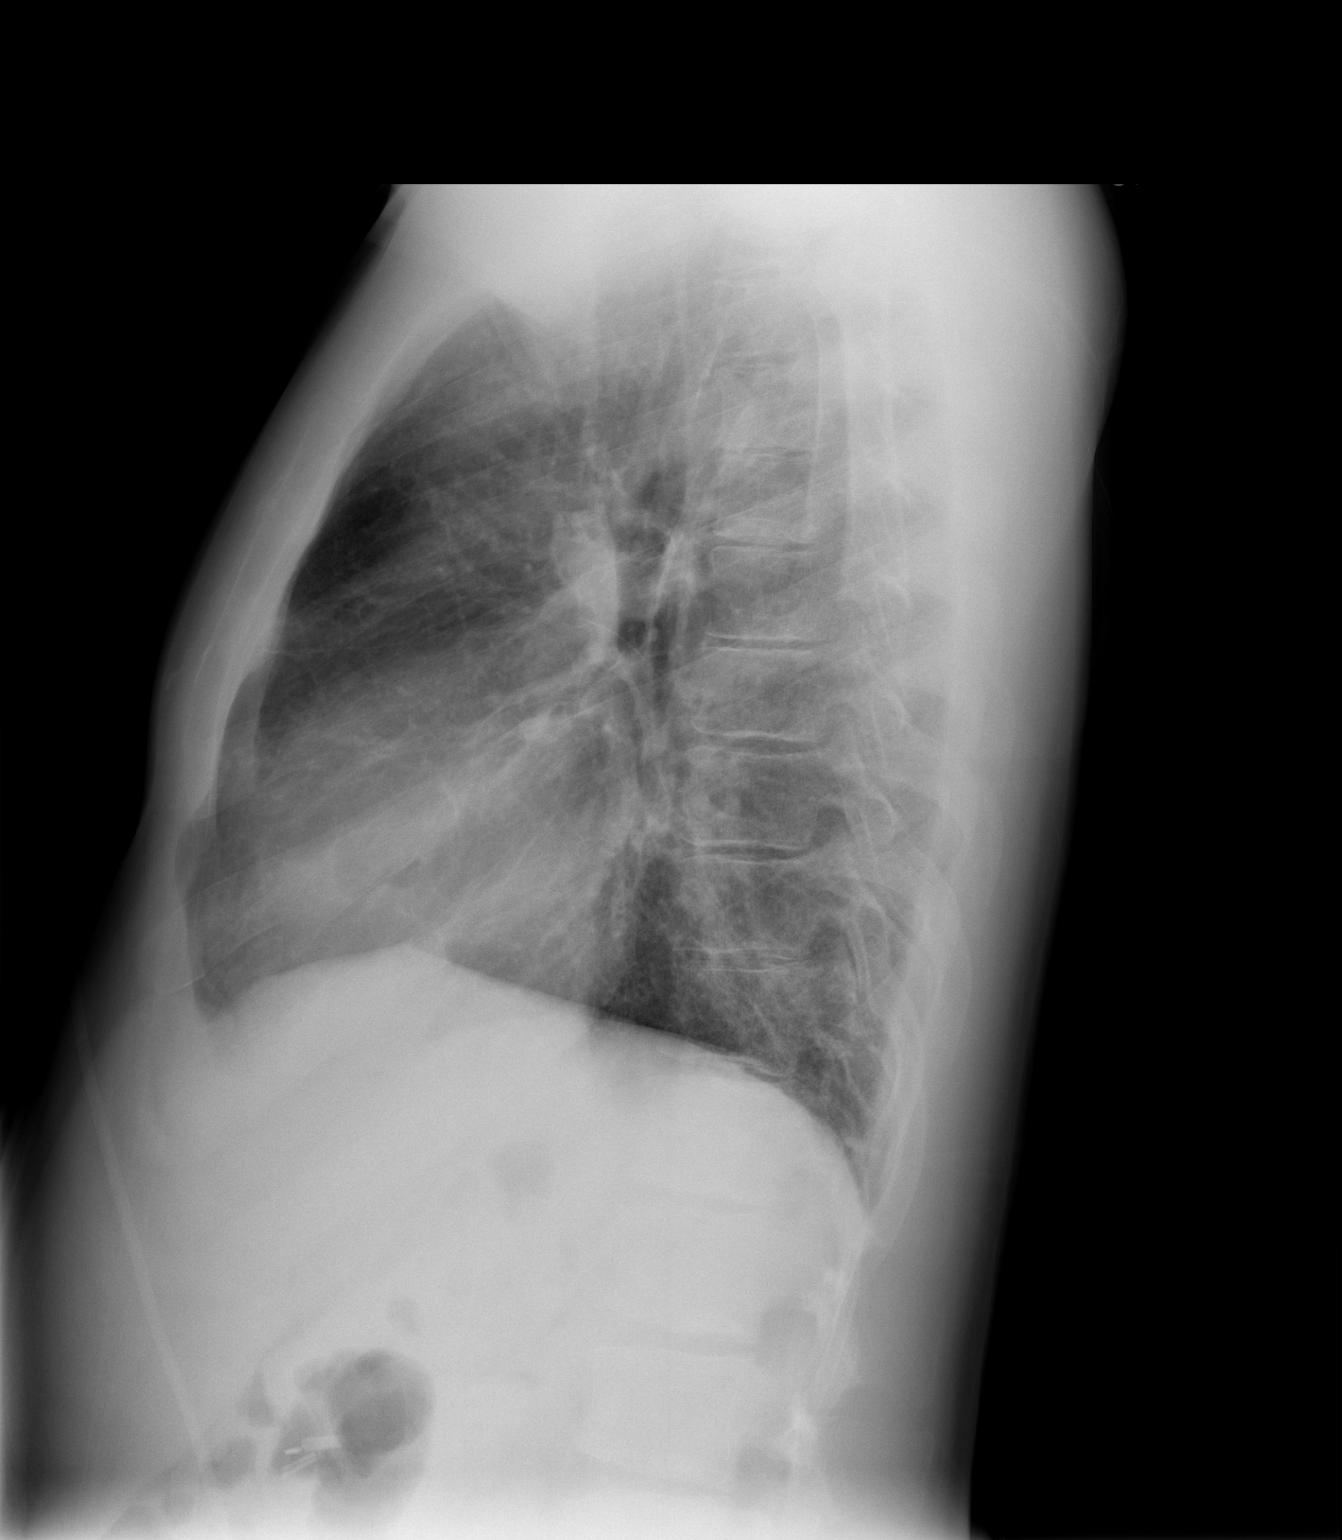

[2 of 2 positions shown; findings below may reference images not displayed]

FINDINGS: Stable mild cardiomegaly without congestive heart
failure.  Mildly accentuated markings at the lung bases as before.
No active disease or pleural fluid.  Osseous structures intact.
IMPRESSION: 1.  Mild cardiomegaly - stable, without congestive heart failure.
2.  Accentuated basilar markings that appear chronic.  No definite
pneumonia at this time.

## 2012-08-06 DIAGNOSIS — R059 Cough, unspecified: Secondary | ICD-10-CM | POA: Diagnosis not present

## 2012-08-06 DIAGNOSIS — F39 Unspecified mood [affective] disorder: Secondary | ICD-10-CM | POA: Diagnosis not present

## 2012-08-06 DIAGNOSIS — R918 Other nonspecific abnormal finding of lung field: Secondary | ICD-10-CM | POA: Diagnosis not present

## 2012-08-06 DIAGNOSIS — Z79899 Other long term (current) drug therapy: Secondary | ICD-10-CM | POA: Diagnosis not present

## 2012-08-06 DIAGNOSIS — R197 Diarrhea, unspecified: Secondary | ICD-10-CM | POA: Diagnosis not present

## 2012-08-06 DIAGNOSIS — M549 Dorsalgia, unspecified: Secondary | ICD-10-CM | POA: Diagnosis not present

## 2012-08-06 DIAGNOSIS — Z87891 Personal history of nicotine dependence: Secondary | ICD-10-CM | POA: Diagnosis not present

## 2012-08-06 DIAGNOSIS — M8700 Idiopathic aseptic necrosis of unspecified bone: Secondary | ICD-10-CM | POA: Diagnosis not present

## 2012-08-06 DIAGNOSIS — Z86718 Personal history of other venous thrombosis and embolism: Secondary | ICD-10-CM | POA: Diagnosis not present

## 2012-08-06 DIAGNOSIS — E869 Volume depletion, unspecified: Secondary | ICD-10-CM | POA: Diagnosis present

## 2012-08-06 DIAGNOSIS — A0472 Enterocolitis due to Clostridium difficile, not specified as recurrent: Secondary | ICD-10-CM | POA: Diagnosis not present

## 2012-08-06 DIAGNOSIS — Z8739 Personal history of other diseases of the musculoskeletal system and connective tissue: Secondary | ICD-10-CM | POA: Diagnosis not present

## 2012-08-06 DIAGNOSIS — Z86711 Personal history of pulmonary embolism: Secondary | ICD-10-CM | POA: Diagnosis not present

## 2012-08-06 DIAGNOSIS — I10 Essential (primary) hypertension: Secondary | ICD-10-CM | POA: Diagnosis not present

## 2012-08-06 DIAGNOSIS — R0781 Pleurodynia: Secondary | ICD-10-CM | POA: Insufficient documentation

## 2012-08-06 DIAGNOSIS — R079 Chest pain, unspecified: Secondary | ICD-10-CM | POA: Diagnosis not present

## 2012-08-06 DIAGNOSIS — R011 Cardiac murmur, unspecified: Secondary | ICD-10-CM | POA: Diagnosis not present

## 2012-08-06 DIAGNOSIS — Z862 Personal history of diseases of the blood and blood-forming organs and certain disorders involving the immune mechanism: Secondary | ICD-10-CM | POA: Diagnosis not present

## 2012-08-06 DIAGNOSIS — D649 Anemia, unspecified: Secondary | ICD-10-CM | POA: Diagnosis not present

## 2012-08-06 DIAGNOSIS — R1110 Vomiting, unspecified: Secondary | ICD-10-CM | POA: Diagnosis not present

## 2012-08-06 DIAGNOSIS — D5700 Hb-SS disease with crisis, unspecified: Secondary | ICD-10-CM | POA: Diagnosis not present

## 2012-08-06 DIAGNOSIS — Z8709 Personal history of other diseases of the respiratory system: Secondary | ICD-10-CM | POA: Diagnosis not present

## 2012-08-06 DIAGNOSIS — Z7901 Long term (current) use of anticoagulants: Secondary | ICD-10-CM | POA: Diagnosis not present

## 2012-08-07 DIAGNOSIS — M8700 Idiopathic aseptic necrosis of unspecified bone: Secondary | ICD-10-CM | POA: Diagnosis not present

## 2012-08-07 DIAGNOSIS — E869 Volume depletion, unspecified: Secondary | ICD-10-CM | POA: Diagnosis not present

## 2012-08-07 DIAGNOSIS — D5700 Hb-SS disease with crisis, unspecified: Secondary | ICD-10-CM | POA: Diagnosis not present

## 2012-08-07 DIAGNOSIS — R079 Chest pain, unspecified: Secondary | ICD-10-CM | POA: Diagnosis not present

## 2012-08-07 DIAGNOSIS — Z7901 Long term (current) use of anticoagulants: Secondary | ICD-10-CM | POA: Diagnosis not present

## 2012-08-07 DIAGNOSIS — A0472 Enterocolitis due to Clostridium difficile, not specified as recurrent: Secondary | ICD-10-CM | POA: Diagnosis not present

## 2012-08-07 DIAGNOSIS — D649 Anemia, unspecified: Secondary | ICD-10-CM | POA: Diagnosis not present

## 2012-08-07 DIAGNOSIS — Z86718 Personal history of other venous thrombosis and embolism: Secondary | ICD-10-CM | POA: Diagnosis not present

## 2012-08-08 DIAGNOSIS — D5700 Hb-SS disease with crisis, unspecified: Secondary | ICD-10-CM | POA: Diagnosis not present

## 2012-08-08 DIAGNOSIS — M8700 Idiopathic aseptic necrosis of unspecified bone: Secondary | ICD-10-CM | POA: Diagnosis not present

## 2012-08-08 DIAGNOSIS — E869 Volume depletion, unspecified: Secondary | ICD-10-CM | POA: Diagnosis not present

## 2012-08-08 DIAGNOSIS — A0472 Enterocolitis due to Clostridium difficile, not specified as recurrent: Secondary | ICD-10-CM | POA: Diagnosis not present

## 2012-08-08 DIAGNOSIS — R079 Chest pain, unspecified: Secondary | ICD-10-CM | POA: Diagnosis not present

## 2012-08-08 DIAGNOSIS — Z7901 Long term (current) use of anticoagulants: Secondary | ICD-10-CM | POA: Diagnosis not present

## 2012-08-08 DIAGNOSIS — Z86718 Personal history of other venous thrombosis and embolism: Secondary | ICD-10-CM | POA: Diagnosis not present

## 2012-08-08 DIAGNOSIS — D649 Anemia, unspecified: Secondary | ICD-10-CM | POA: Diagnosis not present

## 2012-08-09 DIAGNOSIS — R079 Chest pain, unspecified: Secondary | ICD-10-CM | POA: Diagnosis not present

## 2012-08-09 DIAGNOSIS — Z7901 Long term (current) use of anticoagulants: Secondary | ICD-10-CM | POA: Diagnosis not present

## 2012-08-09 DIAGNOSIS — E869 Volume depletion, unspecified: Secondary | ICD-10-CM | POA: Diagnosis not present

## 2012-08-09 DIAGNOSIS — D5700 Hb-SS disease with crisis, unspecified: Secondary | ICD-10-CM | POA: Diagnosis not present

## 2012-08-09 DIAGNOSIS — D649 Anemia, unspecified: Secondary | ICD-10-CM | POA: Diagnosis not present

## 2012-08-09 DIAGNOSIS — Z86718 Personal history of other venous thrombosis and embolism: Secondary | ICD-10-CM | POA: Diagnosis not present

## 2012-08-09 DIAGNOSIS — M8700 Idiopathic aseptic necrosis of unspecified bone: Secondary | ICD-10-CM | POA: Diagnosis not present

## 2012-08-09 DIAGNOSIS — A0472 Enterocolitis due to Clostridium difficile, not specified as recurrent: Secondary | ICD-10-CM | POA: Diagnosis not present

## 2012-08-10 DIAGNOSIS — Z86718 Personal history of other venous thrombosis and embolism: Secondary | ICD-10-CM | POA: Diagnosis not present

## 2012-08-10 DIAGNOSIS — E869 Volume depletion, unspecified: Secondary | ICD-10-CM | POA: Diagnosis not present

## 2012-08-10 DIAGNOSIS — M8700 Idiopathic aseptic necrosis of unspecified bone: Secondary | ICD-10-CM | POA: Diagnosis not present

## 2012-08-10 DIAGNOSIS — D649 Anemia, unspecified: Secondary | ICD-10-CM | POA: Diagnosis not present

## 2012-08-10 DIAGNOSIS — A0472 Enterocolitis due to Clostridium difficile, not specified as recurrent: Secondary | ICD-10-CM | POA: Diagnosis not present

## 2012-08-10 DIAGNOSIS — D5700 Hb-SS disease with crisis, unspecified: Secondary | ICD-10-CM | POA: Diagnosis not present

## 2012-08-10 DIAGNOSIS — R079 Chest pain, unspecified: Secondary | ICD-10-CM | POA: Diagnosis not present

## 2012-08-10 DIAGNOSIS — Z7901 Long term (current) use of anticoagulants: Secondary | ICD-10-CM | POA: Diagnosis not present

## 2012-08-11 DIAGNOSIS — A0472 Enterocolitis due to Clostridium difficile, not specified as recurrent: Secondary | ICD-10-CM | POA: Diagnosis not present

## 2012-08-11 DIAGNOSIS — Z7901 Long term (current) use of anticoagulants: Secondary | ICD-10-CM | POA: Diagnosis not present

## 2012-08-11 DIAGNOSIS — E869 Volume depletion, unspecified: Secondary | ICD-10-CM | POA: Diagnosis not present

## 2012-08-11 DIAGNOSIS — R079 Chest pain, unspecified: Secondary | ICD-10-CM | POA: Diagnosis not present

## 2012-08-11 DIAGNOSIS — D649 Anemia, unspecified: Secondary | ICD-10-CM | POA: Diagnosis not present

## 2012-08-11 DIAGNOSIS — D5700 Hb-SS disease with crisis, unspecified: Secondary | ICD-10-CM | POA: Diagnosis not present

## 2012-08-11 DIAGNOSIS — Z86718 Personal history of other venous thrombosis and embolism: Secondary | ICD-10-CM | POA: Diagnosis not present

## 2012-08-11 DIAGNOSIS — M8700 Idiopathic aseptic necrosis of unspecified bone: Secondary | ICD-10-CM | POA: Diagnosis not present

## 2012-08-12 DIAGNOSIS — Z7901 Long term (current) use of anticoagulants: Secondary | ICD-10-CM | POA: Diagnosis not present

## 2012-08-12 DIAGNOSIS — R079 Chest pain, unspecified: Secondary | ICD-10-CM | POA: Diagnosis not present

## 2012-08-12 DIAGNOSIS — M8700 Idiopathic aseptic necrosis of unspecified bone: Secondary | ICD-10-CM | POA: Diagnosis not present

## 2012-08-12 DIAGNOSIS — A0472 Enterocolitis due to Clostridium difficile, not specified as recurrent: Secondary | ICD-10-CM | POA: Diagnosis not present

## 2012-08-12 DIAGNOSIS — D5700 Hb-SS disease with crisis, unspecified: Secondary | ICD-10-CM | POA: Diagnosis not present

## 2012-08-12 DIAGNOSIS — E869 Volume depletion, unspecified: Secondary | ICD-10-CM | POA: Diagnosis not present

## 2012-08-12 DIAGNOSIS — Z86718 Personal history of other venous thrombosis and embolism: Secondary | ICD-10-CM | POA: Diagnosis not present

## 2012-08-12 DIAGNOSIS — D649 Anemia, unspecified: Secondary | ICD-10-CM | POA: Diagnosis not present

## 2012-08-12 NOTE — Discharge Summary (Signed)
Physician Discharge Summary  [name]:[id number] DOB: 01-22-1981 DOA: 08/01/2012  PCP: Sean Malinowski A., MD  Admit date: 08/01/2012 Discharge date: 08/12/2012  Discharge Diagnoses:  Active Problems: Acute Vaso-occlusive episode associated with Hb SS.   Discharge Condition: Fair. Discussed hospitalization with patient as it appears that pain still inadequately controlled. However patient feels that he will be able to manage pain at home with oral analgesics. Pt able to ambulate independently and is tolerating diet without any difficulty.  Disposition:  Follow-up Information   Follow up with Rehema Muffley A., MD. (As needed if symptoms worsen)    Contact information:   [address] [place] [address] [phone]       Diet:Heart Healthy Wt Readings from Last 3 Encounters:  08/01/12 173 lb (78.472 kg)  07/26/12 173 lb (78.472 kg)  07/24/12 173 lb (78.472 kg)    History of present illness:  This a patient who was seen in the [facility] yesterday for acute management of symptoms. The patient had some relief of pain but is still having significant pain today that is inadequately relieved by oral analgesics. The pain is typical of his pain of sickle cell crisis localized to the back, right flank and legs. The patient describes the pain as throbbing in nature and intensity of 7/10. The pain is nonradiating and not accompanied by any of the symptoms. He is unable to identify any palliative or provocative features   Hospital Course:  Patient was re-admitted to the day hospital again today in early vaso-occlusive crisis. He is having pain above which can be managed with oral analgesics. He initially had some impairment of ambulation secondary to pain. He was treated with aggressive hydration with IV fluids started on scheduled IV analgesics as well as Toradol. The patient had a significant decrease in his pain and was ambulatory in her function within normal  limits at the time of discharge. The patient is being discharged home on Celebrex, MS Contin and Dilaudid to be taken on an as-needed basis. Patient reported his pain to be a 4/10 at the time of discharge. However I am concerned that the patient will need further IV management of his pain but is motivated to discharge to home because this is a holiday weekend. The patient has no impairment of function with his pain and is endorsing his ability to manage pain with out IV medications. Thus I am unable to make a case for necessity of admission. However, based on my knowledge of patient's pattern of vaso-occlusive episodes. I would not be surprised by a re-presentation for admission secondary to pain and possible hemolysis.  Pt is also continued on Xarelto 20 mg daily for treatment of PE.   Discharge Exam: Filed Vitals:   08/01/12 1702  BP:   Pulse: 65  Temp:   Resp: 19   Filed Vitals:   08/01/12 1315 08/01/12 1457 08/01/12 1528 08/01/12 1702  BP:  114/72    Pulse: 60 62 79 65  Temp:  98.4 F (36.9 C)    TempSrc:  Oral    Resp: 19 17 21 19   Height:      Weight:      SpO2: 98% 100% 98% 100%   General: Alert, awake, oriented x3, in NAD.  HEENT: Peter/AT PEERL, EOMI, anicteric  OROPHARYNX: Moist, No exudate/ erythema/lesions.  Heart: Regular rate and rhythm, without murmurs, rubs, gallops.  Lungs: Clear to auscultation, no wheezing or rhonchi noted.  Abdomen: Soft, nontender, nondistended, positive bowel  sounds, no masses no hepatosplenomegaly noted.  Neuro: No focal neurological deficits noted cranial nerves II through XII grossly intact. DTRs 2+ bilaterally upper and lower extremities. Strength functional in bilateral upper and lower extremities.  Musculoskeletal: No warm swelling or erythema around joints, no spinal tenderness noted.  Psychiatric: Patient alert and oriented x3, good insight and cognition, good recent to remote recall.    Discharge Instructions  Discharge Orders    Future Orders Complete By Expires     Diet - low sodium heart healthy  As directed     Increase activity slowly  As directed         Medication List         celecoxib 200 MG capsule  Commonly known as:  CELEBREX  Take 1 capsule (200 mg total) by mouth 2 (two) times daily as needed for pain.     folic acid 1 MG tablet  Commonly known as:  FOLVITE  Take 1 tablet (1 mg total) by mouth every morning.     gabapentin 300 MG capsule  Commonly known as:  NEURONTIN  Take 1 capsule (300 mg total) by mouth daily.     HYDROmorphone 4 MG tablet  Commonly known as:  DILAUDID  Take 1 tablet (4 mg total) by mouth every 4 (four) hours as needed for pain.     hydroxyurea 500 MG capsule  Commonly known as:  HYDREA  Take 2 capsules (1,000 mg total) by mouth daily. May take with food to minimize GI side effects.     morphine 15 MG 12 hr tablet  Commonly known as:  MS CONTIN  Take 1 tablet (15 mg total) by mouth 2 (two) times daily.     naloxone 1 MG/ML injection  Commonly known as:  NARCAN  - Route: Intrnasal  - Naloxone 2 mg/ml pre-filled syringe with naloxone kit.   - Disp: #4 ml (2 Syringes)  - Sig: For suspected opioid overdose, spray 1 ml in each nostril as directed. Repeat after 3 minutes if no or minimal response     potassium chloride SA 20 MEQ tablet  Commonly known as:  K-DUR,KLOR-CON  Take 1 tablet (20 mEq total) by mouth every morning.     XARELTO 20 MG Tabs  Generic drug:  Rivaroxaban  Take 20 mg by mouth daily.     zolpidem 10 MG tablet  Commonly known as:  AMBIEN  Take 1 tablet (10 mg total) by mouth at bedtime as needed. For sleep          The results of significant diagnostics from this hospitalization (including imaging, microbiology, ancillary and laboratory) are listed below for reference.     Time coordinating discharge: Greater than 30 minutes.  Signed:  Yasuko Lapage A.  08/06/2012, 1:31 PM

## 2012-08-13 ENCOUNTER — Encounter (HOSPITAL_COMMUNITY): Payer: Self-pay | Admitting: Emergency Medicine

## 2012-08-13 ENCOUNTER — Emergency Department (HOSPITAL_COMMUNITY)
Admission: EM | Admit: 2012-08-13 | Discharge: 2012-08-13 | Disposition: A | Discharge status: Home / Self Care | Payer: Medicare Other | Attending: Emergency Medicine | Admitting: Emergency Medicine

## 2012-08-13 ENCOUNTER — Telehealth: Payer: Self-pay | Admitting: Internal Medicine

## 2012-08-13 ENCOUNTER — Emergency Department (HOSPITAL_COMMUNITY): Payer: Medicare Other

## 2012-08-13 DIAGNOSIS — Z79899 Other long term (current) drug therapy: Secondary | ICD-10-CM | POA: Insufficient documentation

## 2012-08-13 DIAGNOSIS — R011 Cardiac murmur, unspecified: Secondary | ICD-10-CM | POA: Insufficient documentation

## 2012-08-13 DIAGNOSIS — D5700 Hb-SS disease with crisis, unspecified: Secondary | ICD-10-CM

## 2012-08-13 DIAGNOSIS — I10 Essential (primary) hypertension: Secondary | ICD-10-CM | POA: Insufficient documentation

## 2012-08-13 DIAGNOSIS — R079 Chest pain, unspecified: Secondary | ICD-10-CM | POA: Insufficient documentation

## 2012-08-13 DIAGNOSIS — Z8709 Personal history of other diseases of the respiratory system: Secondary | ICD-10-CM | POA: Insufficient documentation

## 2012-08-13 DIAGNOSIS — Z86718 Personal history of other venous thrombosis and embolism: Secondary | ICD-10-CM | POA: Insufficient documentation

## 2012-08-13 DIAGNOSIS — Z862 Personal history of diseases of the blood and blood-forming organs and certain disorders involving the immune mechanism: Secondary | ICD-10-CM | POA: Insufficient documentation

## 2012-08-13 DIAGNOSIS — E869 Volume depletion, unspecified: Secondary | ICD-10-CM | POA: Diagnosis not present

## 2012-08-13 DIAGNOSIS — Z7901 Long term (current) use of anticoagulants: Secondary | ICD-10-CM | POA: Diagnosis not present

## 2012-08-13 DIAGNOSIS — Z8739 Personal history of other diseases of the musculoskeletal system and connective tissue: Secondary | ICD-10-CM | POA: Insufficient documentation

## 2012-08-13 DIAGNOSIS — Z86711 Personal history of pulmonary embolism: Secondary | ICD-10-CM | POA: Insufficient documentation

## 2012-08-13 DIAGNOSIS — M8700 Idiopathic aseptic necrosis of unspecified bone: Secondary | ICD-10-CM | POA: Diagnosis not present

## 2012-08-13 DIAGNOSIS — D649 Anemia, unspecified: Secondary | ICD-10-CM

## 2012-08-13 DIAGNOSIS — A0472 Enterocolitis due to Clostridium difficile, not specified as recurrent: Secondary | ICD-10-CM | POA: Diagnosis not present

## 2012-08-13 DIAGNOSIS — R52 Pain, unspecified: Secondary | ICD-10-CM

## 2012-08-13 DIAGNOSIS — Z87891 Personal history of nicotine dependence: Secondary | ICD-10-CM | POA: Insufficient documentation

## 2012-08-13 DIAGNOSIS — R05 Cough: Secondary | ICD-10-CM | POA: Insufficient documentation

## 2012-08-13 DIAGNOSIS — R197 Diarrhea, unspecified: Secondary | ICD-10-CM | POA: Insufficient documentation

## 2012-08-13 DIAGNOSIS — M549 Dorsalgia, unspecified: Secondary | ICD-10-CM | POA: Insufficient documentation

## 2012-08-13 DIAGNOSIS — Z8639 Personal history of other endocrine, nutritional and metabolic disease: Secondary | ICD-10-CM | POA: Insufficient documentation

## 2012-08-13 DIAGNOSIS — R1110 Vomiting, unspecified: Secondary | ICD-10-CM | POA: Insufficient documentation

## 2012-08-13 DIAGNOSIS — R059 Cough, unspecified: Secondary | ICD-10-CM | POA: Insufficient documentation

## 2012-08-13 DIAGNOSIS — F39 Unspecified mood [affective] disorder: Secondary | ICD-10-CM | POA: Insufficient documentation

## 2012-08-13 LAB — CBC WITH DIFFERENTIAL/PLATELET
Basophils Absolute: 0 10*3/uL (ref 0.0–0.1)
Eosinophils Absolute: 0.8 10*3/uL — ABNORMAL HIGH (ref 0.0–0.7)
Lymphocytes Relative: 12 % (ref 12–46)
MCHC: 34.3 g/dL (ref 30.0–36.0)
Monocytes Relative: 16 % — ABNORMAL HIGH (ref 3–12)
Neutrophils Relative %: 68 % (ref 43–77)
RDW: 22.5 % — ABNORMAL HIGH (ref 11.5–15.5)
WBC: 19.1 10*3/uL — ABNORMAL HIGH (ref 4.0–10.5)

## 2012-08-13 LAB — RETICULOCYTES
RBC.: 2.22 MIL/uL — ABNORMAL LOW (ref 4.22–5.81)
Retic Count, Absolute: 350.8 10*3/uL — ABNORMAL HIGH (ref 19.0–186.0)

## 2012-08-13 MED ORDER — DIPHENHYDRAMINE HCL 25 MG PO CAPS
50.0000 mg | ORAL_CAPSULE | Freq: Once | ORAL | Status: AC
  Administered 2012-08-13: 50 mg via ORAL
  Filled 2012-08-13: qty 2

## 2012-08-13 MED ORDER — HYDROMORPHONE HCL PF 1 MG/ML IJ SOLN
1.0000 mg | Freq: Once | INTRAMUSCULAR | Status: AC
  Administered 2012-08-13: 1 mg via INTRAVENOUS
  Filled 2012-08-13: qty 1

## 2012-08-13 MED ORDER — SODIUM CHLORIDE 0.9 % IV BOLUS (SEPSIS)
1000.0000 mL | Freq: Once | INTRAVENOUS | Status: AC
  Administered 2012-08-13: 1000 mL via INTRAVENOUS

## 2012-08-13 MED ORDER — HEPARIN SOD (PORK) LOCK FLUSH 100 UNIT/ML IV SOLN
500.0000 [IU] | Freq: Once | INTRAVENOUS | Status: AC
  Administered 2012-08-13: 500 [IU] via INTRAVENOUS
  Filled 2012-08-13: qty 5

## 2012-08-13 MED ORDER — KETOROLAC TROMETHAMINE 30 MG/ML IJ SOLN
30.0000 mg | Freq: Once | INTRAMUSCULAR | Status: AC
Start: 2012-08-13 — End: 2012-08-13
  Administered 2012-08-13: 30 mg via INTRAVENOUS
  Filled 2012-08-13: qty 1

## 2012-08-13 NOTE — ED Notes (Signed)
Per pt, sharp throbbing pain in ribs and back-going on for 3-4 days-not able to control with home meds

## 2012-08-13 NOTE — ED Provider Notes (Signed)
History    CSN: [id number] Arrival date & time 08/13/12  1341  First MD Initiated Contact with Patient 08/13/12 1504     Chief Complaint  Patient presents with  . Sickle Cell Pain Crisis   (Consider location/radiation/quality/duration/timing/severity/associated sxs/prior Treatment) HPI Comments: 32 yo male with SCA, htn, PE hx, on xarelto presents with sharp pain in ribs bilateral and back for 2 days.  Pt finished 2 days of vomiting/ diarrhea two days prior.  Pain similar to sickle cell pain.  No sob.  Mild cough.  No hemoptysis.  Pt has fup with Dr Ashley Royalty.  Pt taking meds at home.  Not worse with breathing, worse to palpation and movement.    Patient is a 32 y.o. male presenting with sickle cell pain. The history is provided by the patient.  Sickle Cell Pain Crisis Severity:  Moderate Associated symptoms: vomiting   Associated symptoms: no chest pain, no fever, no headaches and no shortness of breath    Past Medical History  Diagnosis Date  . Sickle cell anemia   . Blood transfusion   . 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 Hypokalemia   . Mood disorder   . Pulmonary embolism   . Avascular necrosis   . Leukocytosis     Chronic  . Thrombocytosis     Chronic  . Hypertension    Past Surgical History  Procedure Laterality Date  . Right hip replacement      08/2006  . Cholecystectomy      01/2008  . Porta cath placement    . Porta cath removal    . Umbilical hernia repair      01/2008  . Excision of left periauricular cyst      10/2009  . Excision of right ear lobe cyst with primary closur      11/2007  . Portacath placement  01/05/2012    Procedure: INSERTION PORT-A-CATH;  Surgeon: Adolph Pollack, MD;  Location: [facility] OR;  Service: General;  Laterality: N/A;  ultrasound guiced port a cath insertion with fluoroscopy   Family History  Problem Relation Age of Onset  . Sickle cell anemia Mother   . Sickle cell anemia Father   . Sickle cell  trait Brother    History  Substance Use Topics  . Smoking status: Former Smoker -- 13 years    Types: Cigarettes    Quit date: 07/08/2010  . Smokeless tobacco: Never Used  . Alcohol Use: No    Review of Systems  Constitutional: Negative for fever and chills.  HENT: Negative for neck pain and neck stiffness.   Eyes: Negative for visual disturbance.  Respiratory: Negative for shortness of breath.   Cardiovascular: Negative for chest pain.  Gastrointestinal: Positive for vomiting and diarrhea. Negative for abdominal pain.  Genitourinary: Negative for dysuria and flank pain.  Musculoskeletal: Positive for back pain and arthralgias.  Skin: Negative for rash.  Neurological: Negative for light-headedness and headaches.    Allergies  Morphine and related  Home Medications   Current Outpatient Rx  Name  Route  Sig  Dispense  Refill  . celecoxib (CELEBREX) 200 MG capsule   Oral   Take 1 capsule (200 mg total) by mouth 2 (two) times daily as needed for pain.   30 capsule   0   . folic acid (FOLVITE) 1 MG tablet   Oral   Take 1 tablet (1 mg total) by mouth every morning.   30 tablet  11   . gabapentin (NEURONTIN) 300 MG capsule   Oral   Take 1 capsule (300 mg total) by mouth daily.   30 capsule   2   . HYDROmorphone (DILAUDID) 4 MG tablet   Oral   Take 1 tablet (4 mg total) by mouth every 4 (four) hours as needed for pain.   90 tablet   0   . hydroxyurea (HYDREA) 500 MG capsule   Oral   Take 2 capsules (1,000 mg total) by mouth daily. May take with food to minimize GI side effects.   60 capsule   1   . morphine (MS CONTIN) 15 MG 12 hr tablet   Oral   Take 1 tablet (15 mg total) by mouth 2 (two) times daily.   60 tablet   0   . potassium chloride SA (K-DUR,KLOR-CON) 20 MEQ tablet   Oral   Take 1 tablet (20 mEq total) by mouth every morning.   30 tablet   3   . Rivaroxaban (XARELTO) 20 MG TABS   Oral   Take 20 mg by mouth daily.         Marland Kitchen zolpidem  (AMBIEN) 10 MG tablet   Oral   Take 1 tablet (10 mg total) by mouth at bedtime as needed. For sleep   30 tablet   0   . naloxone (NARCAN) 1 MG/ML injection      Route: Intrnasal Naloxone 2 mg/ml pre-filled syringe with naloxone kit.  Disp: #4 ml (2 Syringes) Sig: For suspected opioid overdose, spray 1 ml in each nostril as directed. Repeat after 3 minutes if no or minimal response   4 mL   1    BP 112/78  Pulse 100  Temp(Src) 99.8 F (37.7 C) (Oral)  Resp 16  SpO2 100% Physical Exam  Nursing note and vitals reviewed. Constitutional: He is oriented to person, place, and time. He appears well-developed and well-nourished.  HENT:  Head: Normocephalic and atraumatic.  Eyes: Conjunctivae are normal. Right eye exhibits no discharge. Left eye exhibits no discharge.  Neck: Normal range of motion. Neck supple. No tracheal deviation present.  Cardiovascular: Regular rhythm.   Murmur (2+ SM LSB) heard. Pulmonary/Chest: Effort normal and breath sounds normal.  Abdominal: Soft. He exhibits no distension. There is no tenderness. There is no guarding.  Musculoskeletal: He exhibits tenderness (lateral ribs bilateral). He exhibits no edema.  Neurological: He is alert and oriented to person, place, and time. No cranial nerve deficit.  Skin: Skin is warm. No rash noted.  Psychiatric: He has a normal mood and affect.    ED Course  Procedures (including critical care time) Labs Reviewed  RETICULOCYTES - Abnormal; Notable for the following:    Retic Ct Pct 15.8 (*)    RBC. 2.22 (*)    Retic Count, Manual 350.8 (*)    All other components within normal limits  CBC WITH DIFFERENTIAL - Abnormal; Notable for the following:    WBC 19.1 (*)    RBC 2.22 (*)    Hemoglobin 7.0 (*)    HCT 20.4 (*)    RDW 22.5 (*)    Platelets 414 (*)    Monocytes Relative 16 (*)    Neutro Abs 12.9 (*)    Monocytes Absolute 3.1 (*)    Eosinophils Absolute 0.8 (*)    All other components within normal limits   Pt requested to go home No results found. No diagnosis found.  MDM  Pt improved in ED. Similar  to previous pain. DC well appearing Dg Chest 2 View  08/13/2012   *RADIOLOGY REPORT*  Clinical Data: Left-sided chest pain.  CHEST - 2 VIEW  Comparison: 07/09/2012 CT.  Findings: Left subclavian power port is present with the position unchanged compared to prior.  The tip is in the superior vena cava just superior to the cavoatrial junction.  Cardiopericardial silhouette remains mildly enlarged for projection.  The chronic pulmonary parenchymal scarring is present with interstitial prominence for age.  No airspace disease.  No focal consolidation or effusion.  Cholecystectomy.  IMPRESSION:  1.  Cardiomegaly eight and interstitial changes compatible with sickle cell anemia. 2.  No radiographic findings of acute chest syndrome.   Original Report Authenticated By: Andreas Newport, M.D.    Enid Skeens, MD 08/14/12 [phone]

## 2012-08-14 ENCOUNTER — Encounter (HOSPITAL_COMMUNITY): Payer: Self-pay | Admitting: Hematology

## 2012-08-14 ENCOUNTER — Telehealth (HOSPITAL_COMMUNITY): Payer: Self-pay | Admitting: Hematology

## 2012-08-14 ENCOUNTER — Non-Acute Institutional Stay (HOSPITAL_COMMUNITY)
Admission: AD | Admit: 2012-08-14 | Discharge: 2012-08-14 | Disposition: A | Discharge status: Home / Self Care | Payer: Medicare Other | Attending: Internal Medicine | Admitting: Internal Medicine

## 2012-08-14 DIAGNOSIS — Z79899 Other long term (current) drug therapy: Secondary | ICD-10-CM | POA: Insufficient documentation

## 2012-08-14 DIAGNOSIS — D72829 Elevated white blood cell count, unspecified: Secondary | ICD-10-CM | POA: Insufficient documentation

## 2012-08-14 DIAGNOSIS — Z87891 Personal history of nicotine dependence: Secondary | ICD-10-CM | POA: Insufficient documentation

## 2012-08-14 DIAGNOSIS — D473 Essential (hemorrhagic) thrombocythemia: Secondary | ICD-10-CM | POA: Diagnosis not present

## 2012-08-14 DIAGNOSIS — D5700 Hb-SS disease with crisis, unspecified: Secondary | ICD-10-CM

## 2012-08-14 DIAGNOSIS — M545 Low back pain, unspecified: Secondary | ICD-10-CM | POA: Diagnosis not present

## 2012-08-14 DIAGNOSIS — G589 Mononeuropathy, unspecified: Secondary | ICD-10-CM | POA: Diagnosis not present

## 2012-08-14 DIAGNOSIS — I10 Essential (primary) hypertension: Secondary | ICD-10-CM | POA: Diagnosis not present

## 2012-08-14 DIAGNOSIS — F39 Unspecified mood [affective] disorder: Secondary | ICD-10-CM | POA: Diagnosis not present

## 2012-08-14 DIAGNOSIS — Z7901 Long term (current) use of anticoagulants: Secondary | ICD-10-CM | POA: Diagnosis not present

## 2012-08-14 DIAGNOSIS — E876 Hypokalemia: Secondary | ICD-10-CM | POA: Diagnosis not present

## 2012-08-14 DIAGNOSIS — G4701 Insomnia due to medical condition: Secondary | ICD-10-CM

## 2012-08-14 DIAGNOSIS — K5900 Constipation, unspecified: Secondary | ICD-10-CM | POA: Diagnosis not present

## 2012-08-14 DIAGNOSIS — Z86711 Personal history of pulmonary embolism: Secondary | ICD-10-CM | POA: Diagnosis not present

## 2012-08-14 LAB — CBC WITH DIFFERENTIAL/PLATELET
Basophils Relative: 0 % (ref 0–1)
Eosinophils Relative: 3 % (ref 0–5)
Hemoglobin: 7.3 g/dL — ABNORMAL LOW (ref 13.0–17.0)
Lymphocytes Relative: 9 % — ABNORMAL LOW (ref 12–46)
Monocytes Absolute: 1.9 10*3/uL — ABNORMAL HIGH (ref 0.1–1.0)
Monocytes Relative: 10 % (ref 3–12)
Neutrophils Relative %: 78 % — ABNORMAL HIGH (ref 43–77)
Platelets: 515 10*3/uL — ABNORMAL HIGH (ref 150–400)
RBC: 2.35 MIL/uL — ABNORMAL LOW (ref 4.22–5.81)
WBC: 19.1 10*3/uL — ABNORMAL HIGH (ref 4.0–10.5)

## 2012-08-14 LAB — RETICULOCYTES: Retic Count, Absolute: 331.4 10*3/uL — ABNORMAL HIGH (ref 19.0–186.0)

## 2012-08-14 LAB — COMPREHENSIVE METABOLIC PANEL
ALT: 23 U/L (ref 0–53)
AST: 32 U/L (ref 0–37)
Alkaline Phosphatase: 77 U/L (ref 39–117)
CO2: 23 mEq/L (ref 19–32)
Calcium: 8.8 mg/dL (ref 8.4–10.5)
Chloride: 105 mEq/L (ref 96–112)
GFR calc non Af Amer: >90 mL/min (ref >90)
Potassium: 3.4 mEq/L — ABNORMAL LOW (ref 3.5–5.1)
Sodium: 138 mEq/L (ref 135–145)

## 2012-08-14 IMAGING — CR DG CHEST 2V
2 series · 2 of 2 positions shown · non-contrast
Comparison: 09/09/2009

CLINICAL DATA: Chest pain, cough, sickle cell disease

CHEST - 2 VIEW

[w chest pa]
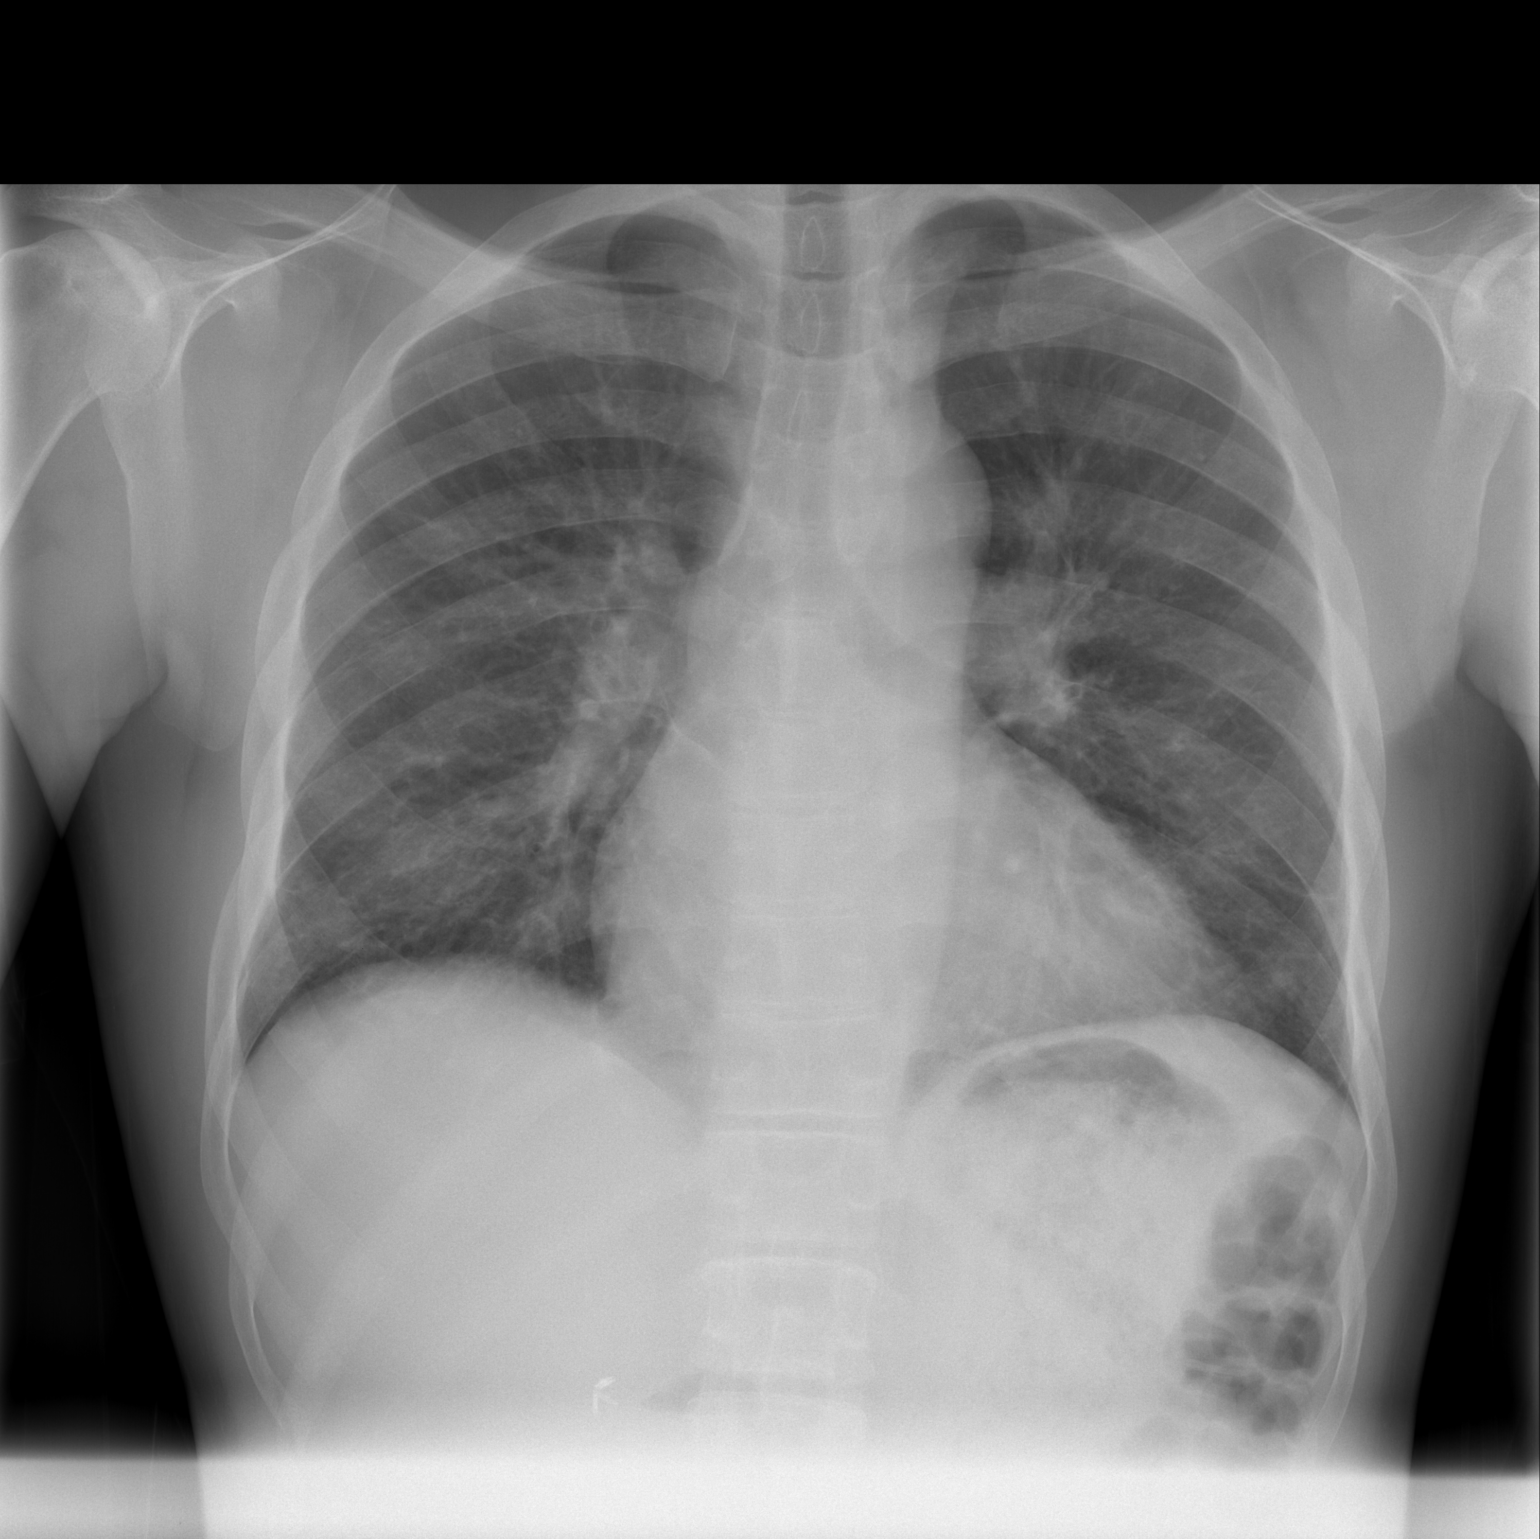

[w chest lat]
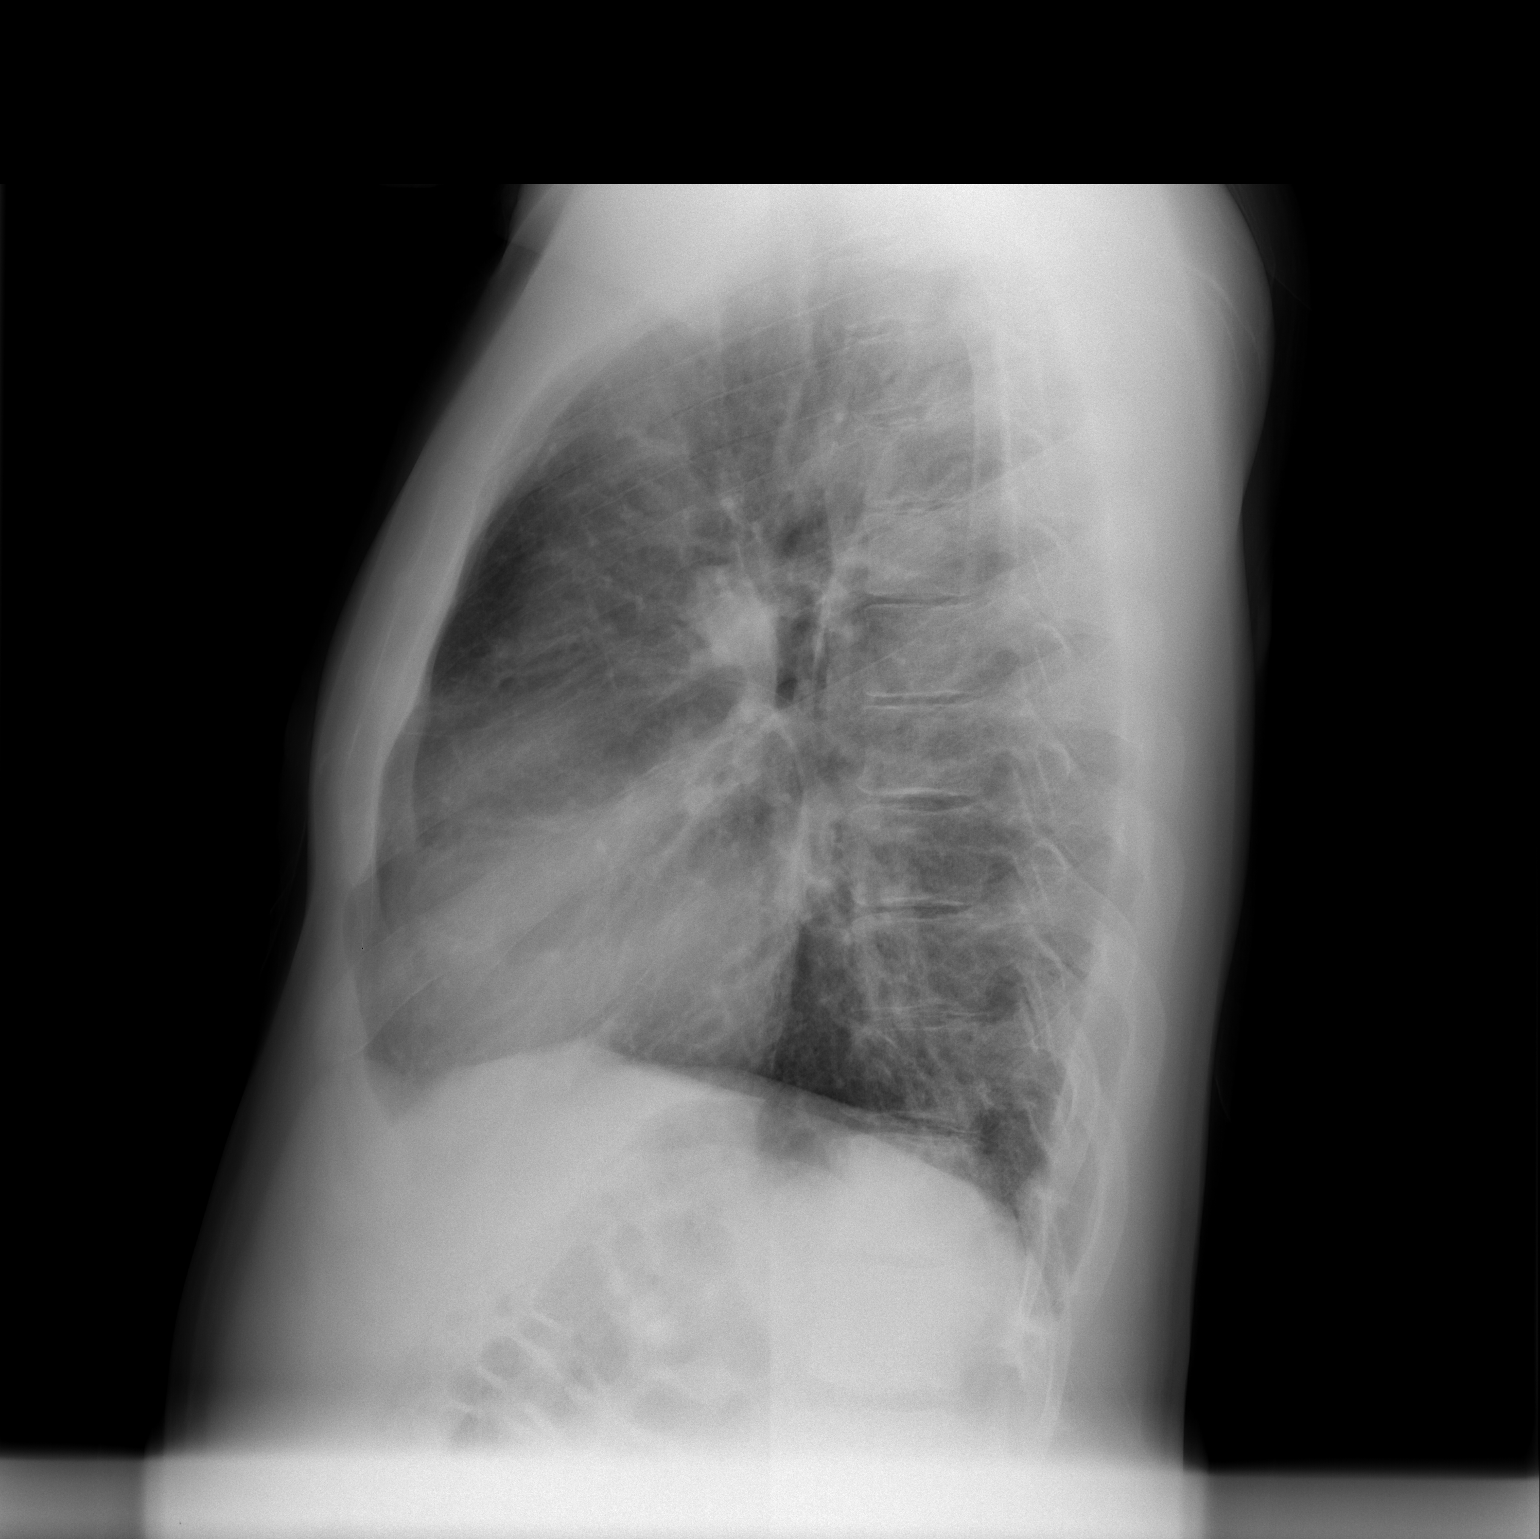

[2 of 2 positions shown; findings below may reference images not displayed]

FINDINGS: Borderline cardiomegaly again noted without evidence for
edema.  Lung volumes at the lower limits of normal with stable
increased interstitial markings predominately at the bases.  No new
focal airspace opacity.  No pleural effusion.  No new bony
abnormality. Cholecystectomy clips noted.
IMPRESSION: Chronic findings as above, no new focal finding.

## 2012-08-14 MED ORDER — ZOLPIDEM TARTRATE 10 MG PO TABS: 10.0000 mg | ORAL_TABLET | Freq: Every evening | ORAL | Status: DC | PRN

## 2012-08-14 MED ORDER — HYDROXYUREA 500 MG PO CAPS
1000.0000 mg | ORAL_CAPSULE | Freq: Every day | ORAL | Status: DC
  Administered 2012-08-14: 1000 mg via ORAL
  Filled 2012-08-14: qty 2

## 2012-08-14 MED ORDER — MORPHINE SULFATE ER 15 MG PO TBCR
15.0000 mg | EXTENDED_RELEASE_TABLET | Freq: Two times a day (BID) | ORAL | Status: DC
  Administered 2012-08-14: 15 mg via ORAL
  Filled 2012-08-14: qty 1

## 2012-08-14 MED ORDER — HYDROMORPHONE HCL PF 2 MG/ML IJ SOLN
4.0000 mg | INTRAMUSCULAR | Status: AC
  Administered 2012-08-14 (×3): 4 mg via INTRAVENOUS
  Filled 2012-08-14 (×3): qty 2

## 2012-08-14 MED ORDER — SODIUM CHLORIDE 0.9 % IJ SOLN
10.0000 mL | INTRAMUSCULAR | Status: AC | PRN
  Administered 2012-08-14: 10 mL

## 2012-08-14 MED ORDER — DEXTROSE-NACL 5-0.45 % IV SOLN
INTRAVENOUS | Status: DC
  Administered 2012-08-14: 11:00:00 via INTRAVENOUS

## 2012-08-14 MED ORDER — ONDANSETRON HCL 4 MG/2ML IJ SOLN: 4.0000 mg | INTRAMUSCULAR | Status: DC | PRN

## 2012-08-14 MED ORDER — RIVAROXABAN 20 MG PO TABS
20.0000 mg | ORAL_TABLET | Freq: Every day | ORAL | Status: DC
  Administered 2012-08-14: 20 mg via ORAL
  Filled 2012-08-14 (×2): qty 1

## 2012-08-14 MED ORDER — HYDROMORPHONE HCL PF 2 MG/ML IJ SOLN
4.0000 mg | Freq: Once | INTRAMUSCULAR | Status: AC
  Administered 2012-08-14: 4 mg via INTRAVENOUS
  Filled 2012-08-14: qty 2

## 2012-08-14 MED ORDER — HEPARIN SOD (PORK) LOCK FLUSH 100 UNIT/ML IV SOLN
500.0000 [IU] | INTRAVENOUS | Status: AC | PRN
  Administered 2012-08-14: 500 [IU]
  Filled 2012-08-14: qty 5

## 2012-08-14 MED ORDER — KETOROLAC TROMETHAMINE 30 MG/ML IJ SOLN
30.0000 mg | Freq: Four times a day (QID) | INTRAMUSCULAR | Status: DC
  Administered 2012-08-14 (×2): 30 mg via INTRAVENOUS
  Filled 2012-08-14 (×2): qty 1

## 2012-08-14 MED ORDER — DEXTROSE-NACL 5-0.2 % IV SOLN: INTRAVENOUS | Status: DC

## 2012-08-14 MED ORDER — GABAPENTIN 100 MG PO CAPS
300.0000 mg | ORAL_CAPSULE | Freq: Every day | ORAL | Status: DC
  Administered 2012-08-14: 300 mg via ORAL
  Filled 2012-08-14: qty 3

## 2012-08-14 MED ORDER — SENNOSIDES-DOCUSATE SODIUM 8.6-50 MG PO TABS: 1.0000 | ORAL_TABLET | Freq: Two times a day (BID) | ORAL | Status: DC

## 2012-08-14 MED ORDER — ONDANSETRON HCL 4 MG PO TABS: 4.0000 mg | ORAL_TABLET | ORAL | Status: DC | PRN

## 2012-08-14 MED ORDER — DIPHENHYDRAMINE HCL 50 MG/ML IJ SOLN: 12.5000 mg | INTRAMUSCULAR | Status: DC | PRN

## 2012-08-14 MED ORDER — FOLIC ACID 1 MG PO TABS
1.0000 mg | ORAL_TABLET | Freq: Every day | ORAL | Status: DC
  Administered 2012-08-14: 1 mg via ORAL
  Filled 2012-08-14: qty 1

## 2012-08-14 MED ORDER — DIPHENHYDRAMINE HCL 25 MG PO CAPS: 25.0000 mg | ORAL_CAPSULE | ORAL | Status: DC | PRN

## 2012-08-14 NOTE — Telephone Encounter (Signed)
Patient called C/O pain in ribs & back that is 8/10.  Patient states he was seen in ED on 08/13/2012, and is still having pain.  Patient denies any diarrhea, although he did complain of diarrhea on yesterday.  Advised I would notify the physician and given him a call back.  Patient verbalizes understanding

## 2012-08-14 NOTE — H&P (Signed)
[facility] History and Physical   Date: 08/14/2012  Patient name: [name] Medical record number: [id number] Date of birth: [birth year] Age: 32 y.o. Gender: male PCP: [name],[name], MD  Attending physician: Earlie Lou  MD  Chief Complaint:  Ribs and low back pain  History of Present Illness: This a patient is well known to me and is not narcotic naive. He has SS genotype sickle cell disease. PMH of pulmonary embolism , mood disorder and hypokalemia. He was recently hospitalized at [facility] for vaso occlusive crisis from July 7-17, 2014.  He has been experience increase painful crisis and unable to manage pain on current regimen. N/V/D 2 days ago which was self limiting. He was admitted to the [facility] for acute management of symptoms. His  Pain is 8/10 stabbing and sharpe He is unable to identify any palliative or provocative features.   Meds: Prescriptions prior to admission  Medication Sig Dispense Refill  . celecoxib (CELEBREX) 200 MG capsule Take 1 capsule (200 mg total) by mouth 2 (two) times daily as needed for pain.  30 capsule  0  . folic acid (FOLVITE) 1 MG tablet Take 1 tablet (1 mg total) by mouth every morning.  30 tablet  11  . gabapentin (NEURONTIN) 300 MG capsule Take 1 capsule (300 mg total) by mouth daily.  30 capsule  2  . HYDROmorphone (DILAUDID) 4 MG tablet Take 1 tablet (4 mg total) by mouth every 4 (four) hours as needed for pain.  90 tablet  0  . hydroxyurea (HYDREA) 500 MG capsule Take 2 capsules (1,000 mg total) by mouth daily. May take with food to minimize GI side effects.  60 capsule  1  . morphine (MS CONTIN) 15 MG 12 hr tablet Take 1 tablet (15 mg total) by mouth 2 (two) times daily.  60 tablet  0  . potassium chloride SA (K-DUR,KLOR-CON) 20 MEQ tablet Take 1 tablet (20 mEq total) by mouth every morning.  30 tablet  3  . Rivaroxaban (XARELTO) 20 MG TABS Take 20 mg by mouth daily.      Marland Kitchen zolpidem (AMBIEN) 10 MG tablet Take 1  tablet (10 mg total) by mouth at bedtime as needed. For sleep  30 tablet  0  . naloxone (NARCAN) 1 MG/ML injection Route: Intrnasal Naloxone 2 mg/ml pre-filled syringe with naloxone kit.  Disp: #4 ml (2 Syringes) Sig: For suspected opioid overdose, spray 1 ml in each nostril as directed. Repeat after 3 minutes if no or minimal response  4 mL  1    Allergies: Morphine and related Past Medical History  Diagnosis Date  . Sickle cell anemia   . Blood transfusion   . 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 Hypokalemia   . Mood disorder   . Pulmonary embolism   . Avascular necrosis   . Leukocytosis     Chronic  . Thrombocytosis     Chronic  . Hypertension    Past Surgical History  Procedure Laterality Date  . Right hip replacement      08/2006  . Cholecystectomy      01/2008  . Porta cath placement    . Porta cath removal    . Umbilical hernia repair      01/2008  . Excision of left periauricular cyst      10/2009  . Excision of right ear lobe cyst with primary closur      11/2007  .  Portacath placement  01/05/2012    Procedure: INSERTION PORT-A-CATH;  Surgeon: Adolph Pollack, MD;  Location: [facility] OR;  Service: General;  Laterality: N/A;  ultrasound guiced port a cath insertion with fluoroscopy   Family History  Problem Relation Age of Onset  . Sickle cell anemia Mother   . Sickle cell anemia Father   . Sickle cell trait Brother    History   Social History  . Marital Status: Single    Spouse Name: N/A    Number of Children: 0  . Years of Education: 13   Occupational History  . Unemployed    Social History Main Topics  . Smoking status: Former Smoker -- 13 years    Types: Cigarettes    Quit date: 07/08/2010  . Smokeless tobacco: Never Used  . Alcohol Use: No  . Drug Use: No     Comment: quit smoking 2011  . Sexually Active: Yes -- Male partner(s)    Birth Control/ Protection: None   Other Topics Concern  . Not on file   Social  History Narrative   Lives in an apartment.  Single.  Lives with girlfriend.  Does not use any assist devices.        [name]:  [phone] Mom, emergency contact    Review of Systems: A comprehensive review of systems was negative except for rib and back pain   Physical Exam: Blood pressure 132/75, pulse 65, temperature 98.2 F (36.8 C), temperature source Oral, resp. rate 17, height 6' (1.829 m), weight 173 lb (78.472 kg), SpO2 99.00%. BP 122/71  Pulse 89  Temp(Src) 97.9 F (36.6 C) (Oral)  Resp 18  Ht 6' (1.829 m)  Wt 173 lb (78.472 kg)  BMI 23.46 kg/m2  SpO2 97%  General Appearance:    Alert, cooperative, no distress, appears stated age  Head:    Normocephalic, without obvious abnormality, atraumatic  Eyes:    PERRL, conjunctiva/corneas clear, EOM's intact, mild icteric sclera   Ears:    Normal TM's and external ear canals, both ears have cerumen present   Nose:   Nares normal, septum midline, mucosa normal, no drainage    or sinus tenderness  Throat:   Lips, mucosa, and tongue normal; teeth and gums normal  Neck:   Supple, symmetrical, trachea midline, no adenopathy;       thyroid:  No enlargement/tenderness/nodules; no carotid   bruit or JVD  Back:     Symmetric, no curvature, ROM normal, no CVA tenderness  Lungs:     Clear to auscultation bilaterally, respirations unlabored  Chest wall:    No tenderness or deformity  Heart:    Regular rate and rhythm, S1 and S2 normal, no murmur, rub   or gallop  Abdomen:     Soft, non-tender, bowel sounds active all four quadrants,    no masses, no organomegaly  Genitalia:   denies priapism   Rectal:   deferred  Extremities:   Extremities normal, atraumatic, no cyanosis or edema  Pulses:   2+ and symmetric all extremities  Skin:   Skin color, texture, turgor normal, no rashes or lesions, tattoos present   Lymph nodes:   Cervical, supraclavicular, and axillary nodes normal  Neurologic:   CNII-XII intact. Normal strength,          Lab results: Results for orders placed during the hospital encounter of 08/14/12 (from the past 24 hour(s))  CBC WITH DIFFERENTIAL     Status: Abnormal   Collection Time  08/14/12 11:17 AM      Result Value Range   WBC 19.1 (*) 4.0 - 10.5 K/uL   RBC 2.35 (*) 4.22 - 5.81 MIL/uL   Hemoglobin 7.3 (*) 13.0 - 17.0 g/dL   HCT 16.1 (*) 09.6 - 04.5 %   MCV 91.9  78.0 - 100.0 fL   MCH 31.1  26.0 - 34.0 pg   MCHC 33.8  30.0 - 36.0 g/dL   RDW 40.9 (*) 81.1 - 91.4 %   Platelets 515 (*) 150 - 400 K/uL   Neutrophils Relative % 78 (*) 43 - 77 %   Lymphocytes Relative 9 (*) 12 - 46 %   Monocytes Relative 10  3 - 12 %   Eosinophils Relative 3  0 - 5 %   Basophils Relative 0  0 - 1 %   Neutro Abs 14.9 (*) 1.7 - 7.7 K/uL   Lymphs Abs 1.7  0.7 - 4.0 K/uL   Monocytes Absolute 1.9 (*) 0.1 - 1.0 K/uL   Eosinophils Absolute 0.6  0.0 - 0.7 K/uL   Basophils Absolute 0.0  0.0 - 0.1 K/uL   RBC Morphology POLYCHROMASIA PRESENT    COMPREHENSIVE METABOLIC PANEL     Status: Abnormal   Collection Time    08/14/12 11:17 AM      Result Value Range   Sodium 138  135 - 145 mEq/L   Potassium 3.4 (*) 3.5 - 5.1 mEq/L   Chloride 105  96 - 112 mEq/L   CO2 23  19 - 32 mEq/L   Glucose, Bld 96  70 - 99 mg/dL   BUN 3 (*) 6 - 23 mg/dL   Creatinine, Ser 7.82 (*) 0.50 - 1.35 mg/dL   Calcium 8.8  8.4 - 95.6 mg/dL   Total Protein 7.4  6.0 - 8.3 g/dL   Albumin 3.5  3.5 - 5.2 g/dL   AST 32  0 - 37 U/L   ALT 23  0 - 53 U/L   Alkaline Phosphatase 77  39 - 117 U/L   Total Bilirubin 3.2 (*) 0.3 - 1.2 mg/dL   GFR calc non Af Amer >90  >90 mL/min   GFR calc Af Amer >90  >90 mL/min  RETICULOCYTES     Status: Abnormal   Collection Time    08/14/12 11:17 AM      Result Value Range   Retic Ct Pct 14.1 (*) 0.4 - 3.1 %   RBC. 2.35 (*) 4.22 - 5.81 MIL/uL   Retic Count, Manual 331.4 (*) 19.0 - 186.0 K/uL    Imaging results:  Dg Chest 2 View  08/13/2012   *RADIOLOGY REPORT*  Clinical Data: Left-sided chest pain.   CHEST - 2 VIEW  Comparison: 07/09/2012 CT.  Findings: Left subclavian power port is present with the position unchanged compared to prior.  The tip is in the superior vena cava just superior to the cavoatrial junction.  Cardiopericardial silhouette remains mildly enlarged for projection.  The chronic pulmonary parenchymal scarring is present with interstitial prominence for age.  No airspace disease.  No focal consolidation or effusion.  Cholecystectomy.  IMPRESSION:  1.  Cardiomegaly eight and interstitial changes compatible with sickle cell anemia. 2.  No radiographic findings of acute chest syndrome.   Original Report Authenticated By: Andreas Newport, M.D.     Assessment & Plan: 1.Vaso occlusive crisis/ Pain with hemolysis caused by hyperviscosity, sticky blood vessel and decreased oxygen carrying capacity. Treatment /plan is gently hydrate and place on scheduled analgesic  than prn . Resume home meds hydrea and long acting analgesic.  2. Hypokalemia: replete with Kdur and resume home dose at d/c 3. 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 Adjunct therapy Toradol 30mg  Q 6hrs and followed   [name], [name] 08/14/2012, 2:58 PM

## 2012-08-14 NOTE — Progress Notes (Signed)
Patient ID: [name], male   DOB: 10/17/1980, 32 y.o.   MRN: [id number] Discharge instructions given to patient, port de accessed and flushed per protocol.  Questions answered.

## 2012-08-14 NOTE — Telephone Encounter (Signed)
Spoke with Dr. Mikeal Hawthorne, and it is ok for patient to come to the [facility].  Called patient back and advised.  Patient verbalizes understanding.

## 2012-08-14 NOTE — Discharge Summary (Signed)
[facility] Discharge Summary   Patient ID: [name] MRN: [id number] DOB/AGE: 33-May-1982 33 y.o.  Admit date: 08/14/2012 Discharge date: 08/14/2012  Primary Care Physician:  [name],[name], MD  Admission Diagnoses:  Active Problems:  Vaso occlusive crisis Hypokalemia   Discharge Diagnoses:   Vaso occlusive crisis Hypokalemia  Mood disorder  Hemochromatosis   Discharge Medications:    Medication List    ASK your doctor about these medications       celecoxib 200 MG capsule  Commonly known as:  CELEBREX  Take 1 capsule (200 mg total) by mouth 2 (two) times daily as needed for pain.     folic acid 1 MG tablet  Commonly known as:  FOLVITE  Take 1 tablet (1 mg total) by mouth every morning.     gabapentin 300 MG capsule  Commonly known as:  NEURONTIN  Take 1 capsule (300 mg total) by mouth daily.     HYDROmorphone 4 MG tablet  Commonly known as:  DILAUDID  Take 1 tablet (4 mg total) by mouth every 4 (four) hours as needed for pain.     hydroxyurea 500 MG capsule  Commonly known as:  HYDREA  Take 2 capsules (1,000 mg total) by mouth daily. May take with food to minimize GI side effects.     morphine 15 MG 12 hr tablet  Commonly known as:  MS CONTIN  Take 1 tablet (15 mg total) by mouth 2 (two) times daily.     naloxone 1 MG/ML injection  Commonly known as:  NARCAN  - Route: Intrnasal  - Naloxone 2 mg/ml pre-filled syringe with naloxone kit.   - Disp: #4 ml (2 Syringes)  - Sig: For suspected opioid overdose, spray 1 ml in each nostril as directed. Repeat after 3 minutes if no or minimal response     potassium chloride SA 20 MEQ tablet  Commonly known as:  K-DUR,KLOR-CON  Take 1 tablet (20 mEq total) by mouth every morning.     XARELTO 20 MG Tabs  Generic drug:  Rivaroxaban  Take 20 mg by mouth daily.     zolpidem 10 MG tablet  Commonly known as:  AMBIEN  Take 1 tablet (10 mg total) by mouth at bedtime as needed. For sleep          Consults:  None   Significant Diagnostic Studies:  Dg Chest 2 View  08/13/2012   *RADIOLOGY REPORT*  Clinical Data: Left-sided chest pain.  CHEST - 2 VIEW  Comparison: 07/09/2012 CT.  Findings: Left subclavian power port is present with the position unchanged compared to prior.  The tip is in the superior vena cava just superior to the cavoatrial junction.  Cardiopericardial silhouette remains mildly enlarged for projection.  The chronic pulmonary parenchymal scarring is present with interstitial prominence for age.  No airspace disease.  No focal consolidation or effusion.  Cholecystectomy.  IMPRESSION:  1.  Cardiomegaly eight and interstitial changes compatible with sickle cell anemia. 2.  No radiographic findings of acute chest syndrome.   Original Report Authenticated By: Andreas Newport, M.D.     [facility] Course:  For complete details please refer to admission H and P, but in brief, This a patient is well known to me and is not narcotic naive. He has SS genotype sickle cell disease. He has been experience increase painful crisis and unable to manage pain on current regimen. N/V/D 2 days ago which was self limiting.   He was  aggressively treated for :  1. Vaso occlusive crisis/ Pain with mild hemolysis . He was gently hydrate and place on scheduled analgesic Q 2hrs than prn . Long acting analgesic resumed and hydroxurea with folic acid.   2. Hypokalemia: replete with Kdur and resume home dose at d/c of 20 meq Kdur QD  3. Leucocysytosis: (chronic)  Inflammatory/reactive  process  Adjunct therapy Toradol 30mg  Q 6hrs.   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 Hx of PE: on chronic anticoagulation home dose  6. Constipation: secondary to narcotic use BM 2 days ago cont senokot S after d/c  Physical Exam at Discharge:  BP 122/71  Pulse 89  Temp(Src) 97.9 F (36.6 C) (Oral)  Resp 18  Ht 6' (1.829 m)  Wt 173 lb (78.472 kg)  BMI 23.46 kg/m2   SpO2 97%  Gen: Alert, cooperative, no distress Cardiovascular: Regular rate and rhythm, S1 and S2 normal, no murmur, rub or gallop  Respiratory:Clear to auscultation bilaterally, respirations unlabored  No vocal fremitus  Gastrointestinal: Soft, non-tender, bowel sounds active all four quadrants,  no masses, no organomegaly  Extremities: Extremities normal, atraumatic, no cyanosis or edema  Pulses: 2+ and symmetric all extremities     Disposition at Discharge: 01-Home or Self Care  Discharge Orders:      Future Appointments Provider Department Dept Phone   08/27/2012 2:30 PM Altha Harm, MD  [facility] [phone]      Condition at Discharge:   Stable  Time spent on Discharge:  Greater than 30 minutes.  Signed: [name], [name] 08/14/2012, 7:05 PM

## 2012-08-15 ENCOUNTER — Inpatient Hospital Stay (HOSPITAL_COMMUNITY)
Admission: AD | Admit: 2012-08-15 | Discharge: 2012-08-19 | DRG: 812 | Disposition: A | Discharge status: Home / Self Care | Payer: Medicare Other | Attending: Internal Medicine | Admitting: Internal Medicine

## 2012-08-15 ENCOUNTER — Encounter (HOSPITAL_COMMUNITY): Payer: Self-pay | Admitting: Hematology

## 2012-08-15 ENCOUNTER — Telehealth (HOSPITAL_COMMUNITY): Payer: Self-pay | Admitting: *Deleted

## 2012-08-15 DIAGNOSIS — F39 Unspecified mood [affective] disorder: Secondary | ICD-10-CM | POA: Diagnosis present

## 2012-08-15 DIAGNOSIS — M545 Low back pain, unspecified: Secondary | ICD-10-CM | POA: Diagnosis present

## 2012-08-15 DIAGNOSIS — D72829 Elevated white blood cell count, unspecified: Secondary | ICD-10-CM | POA: Diagnosis present

## 2012-08-15 DIAGNOSIS — D5700 Hb-SS disease with crisis, unspecified: Secondary | ICD-10-CM

## 2012-08-15 DIAGNOSIS — Z7901 Long term (current) use of anticoagulants: Secondary | ICD-10-CM

## 2012-08-15 DIAGNOSIS — K5900 Constipation, unspecified: Secondary | ICD-10-CM | POA: Diagnosis present

## 2012-08-15 DIAGNOSIS — E876 Hypokalemia: Secondary | ICD-10-CM | POA: Diagnosis not present

## 2012-08-15 DIAGNOSIS — Z86711 Personal history of pulmonary embolism: Secondary | ICD-10-CM

## 2012-08-15 DIAGNOSIS — R059 Cough, unspecified: Secondary | ICD-10-CM | POA: Diagnosis not present

## 2012-08-15 DIAGNOSIS — R52 Pain, unspecified: Secondary | ICD-10-CM | POA: Diagnosis present

## 2012-08-15 DIAGNOSIS — R079 Chest pain, unspecified: Secondary | ICD-10-CM | POA: Diagnosis present

## 2012-08-15 DIAGNOSIS — Z96649 Presence of unspecified artificial hip joint: Secondary | ICD-10-CM

## 2012-08-15 DIAGNOSIS — I10 Essential (primary) hypertension: Secondary | ICD-10-CM | POA: Diagnosis present

## 2012-08-15 DIAGNOSIS — I2782 Chronic pulmonary embolism: Secondary | ICD-10-CM | POA: Diagnosis present

## 2012-08-15 LAB — RETICULOCYTES
RBC.: 2.41 MIL/uL — ABNORMAL LOW (ref 4.22–5.81)
Retic Count, Absolute: 380.8 10*3/uL — ABNORMAL HIGH (ref 19.0–186.0)

## 2012-08-15 LAB — CBC WITH DIFFERENTIAL/PLATELET
Eosinophils Relative: 4 % (ref 0–5)
HCT: 21.8 % — ABNORMAL LOW (ref 39.0–52.0)
Lymphocytes Relative: 10 % — ABNORMAL LOW (ref 12–46)
Lymphs Abs: 2 10*3/uL (ref 0.7–4.0)
MCV: 91.6 fL (ref 78.0–100.0)
Monocytes Relative: 11 % (ref 3–12)
Platelets: 551 10*3/uL — ABNORMAL HIGH (ref 150–400)
RBC: 2.38 MIL/uL — ABNORMAL LOW (ref 4.22–5.81)
WBC: 20.4 10*3/uL — ABNORMAL HIGH (ref 4.0–10.5)

## 2012-08-15 MED ORDER — GABAPENTIN 300 MG PO CAPS
300.0000 mg | ORAL_CAPSULE | Freq: Every day | ORAL | Status: DC
  Administered 2012-08-16 – 2012-08-19 (×4): 300 mg via ORAL
  Filled 2012-08-15 (×4): qty 1

## 2012-08-15 MED ORDER — KETOROLAC TROMETHAMINE 30 MG/ML IJ SOLN
30.0000 mg | Freq: Four times a day (QID) | INTRAMUSCULAR | Status: DC
  Administered 2012-08-15 – 2012-08-19 (×17): 30 mg via INTRAVENOUS
  Filled 2012-08-15 (×25): qty 1

## 2012-08-15 MED ORDER — ONDANSETRON HCL 4 MG PO TABS: 4.0000 mg | ORAL_TABLET | ORAL | Status: DC | PRN

## 2012-08-15 MED ORDER — NALOXONE HCL 0.4 MG/ML IJ SOLN: 0.4000 mg | INTRAMUSCULAR | Status: DC | PRN

## 2012-08-15 MED ORDER — DIPHENHYDRAMINE HCL 25 MG PO CAPS: 25.0000 mg | ORAL_CAPSULE | Freq: Four times a day (QID) | ORAL | Status: DC | PRN

## 2012-08-15 MED ORDER — DEXTROSE 5 % IV SOLN
2000.0000 mg | Freq: Every day | INTRAVENOUS | Status: DC
  Filled 2012-08-15: qty 2

## 2012-08-15 MED ORDER — OLANZAPINE 10 MG IM SOLR: 10.0000 mg | Freq: Once | INTRAMUSCULAR | Status: DC

## 2012-08-15 MED ORDER — HYDROMORPHONE HCL PF 2 MG/ML IJ SOLN
4.0000 mg | Freq: Once | INTRAMUSCULAR | Status: AC | PRN
  Administered 2012-08-15: 4 mg via INTRAVENOUS
  Filled 2012-08-15: qty 2

## 2012-08-15 MED ORDER — OLANZAPINE 10 MG PO TABS
10.0000 mg | ORAL_TABLET | Freq: Once | ORAL | Status: DC
  Filled 2012-08-15: qty 1

## 2012-08-15 MED ORDER — DEXTROSE 5 % IV SOLN
2000.0000 mg | Freq: Every day | INTRAVENOUS | Status: DC
  Administered 2012-08-15 – 2012-08-18 (×4): 2000 mg via INTRAVENOUS
  Filled 2012-08-15 (×5): qty 2

## 2012-08-15 MED ORDER — ZOLPIDEM TARTRATE 10 MG PO TABS
10.0000 mg | ORAL_TABLET | Freq: Every evening | ORAL | Status: DC | PRN
  Administered 2012-08-15 – 2012-08-17 (×2): 10 mg via ORAL
  Filled 2012-08-15 (×2): qty 1

## 2012-08-15 MED ORDER — DEXTROSE-NACL 5-0.45 % IV SOLN: INTRAVENOUS | Status: DC

## 2012-08-15 MED ORDER — ONDANSETRON HCL 4 MG/2ML IJ SOLN
4.0000 mg | INTRAMUSCULAR | Status: DC | PRN
  Administered 2012-08-19: 4 mg via INTRAVENOUS
  Filled 2012-08-15: qty 2

## 2012-08-15 MED ORDER — FOLIC ACID 1 MG PO TABS
1.0000 mg | ORAL_TABLET | Freq: Every day | ORAL | Status: DC
  Administered 2012-08-15 – 2012-08-19 (×5): 1 mg via ORAL
  Filled 2012-08-15 (×5): qty 1

## 2012-08-15 MED ORDER — HYDROMORPHONE 0.3 MG/ML IV SOLN
INTRAVENOUS | Status: DC
  Administered 2012-08-15: 19:00:00 via INTRAVENOUS
  Administered 2012-08-16: 2.63 mg via INTRAVENOUS
  Administered 2012-08-16: 5.1 mg via INTRAVENOUS
  Administered 2012-08-16: 06:00:00 via INTRAVENOUS
  Administered 2012-08-16: 4.2 mg via INTRAVENOUS
  Administered 2012-08-16: 2.4 mg via INTRAVENOUS
  Administered 2012-08-16: 3.3 mg via INTRAVENOUS
  Filled 2012-08-15 (×3): qty 25

## 2012-08-15 MED ORDER — HYDROMORPHONE HCL PF 2 MG/ML IJ SOLN
1.0000 mg | INTRAMUSCULAR | Status: DC | PRN
  Administered 2012-08-16 (×2): 2 mg via INTRAVENOUS
  Filled 2012-08-15 (×2): qty 1

## 2012-08-15 MED ORDER — RIVAROXABAN 20 MG PO TABS
20.0000 mg | ORAL_TABLET | Freq: Every day | ORAL | Status: DC
  Administered 2012-08-15 – 2012-08-18 (×4): 20 mg via ORAL
  Filled 2012-08-15 (×5): qty 1

## 2012-08-15 MED ORDER — SODIUM CHLORIDE 0.9 % IV SOLN
25.0000 mg | INTRAVENOUS | Status: DC | PRN
  Filled 2012-08-15: qty 0.5

## 2012-08-15 MED ORDER — HYDROXYUREA 500 MG PO CAPS: 1000.0000 mg | ORAL_CAPSULE | Freq: Every day | ORAL | Status: DC

## 2012-08-15 MED ORDER — HYDROMORPHONE HCL PF 2 MG/ML IJ SOLN
4.0000 mg | INTRAMUSCULAR | Status: DC | PRN
  Administered 2012-08-15 (×4): 4 mg via INTRAVENOUS
  Filled 2012-08-15 (×4): qty 2

## 2012-08-15 MED ORDER — HEPARIN SOD (PORK) LOCK FLUSH 100 UNIT/ML IV SOLN
500.0000 [IU] | INTRAVENOUS | Status: AC | PRN
  Administered 2012-08-19: 500 [IU]
  Filled 2012-08-15: qty 5

## 2012-08-15 MED ORDER — SODIUM CHLORIDE 0.9 % IJ SOLN: 10.0000 mL | INTRAMUSCULAR | Status: DC | PRN

## 2012-08-15 MED ORDER — KETOROLAC TROMETHAMINE 30 MG/ML IJ SOLN: 30.0000 mg | Freq: Four times a day (QID) | INTRAMUSCULAR | Status: DC

## 2012-08-15 MED ORDER — DIPHENHYDRAMINE HCL 25 MG PO CAPS
25.0000 mg | ORAL_CAPSULE | ORAL | Status: DC | PRN
  Administered 2012-08-18 (×2): 50 mg via ORAL
  Filled 2012-08-15 (×2): qty 2

## 2012-08-15 MED ORDER — SODIUM CHLORIDE 0.9 % IJ SOLN: 9.0000 mL | INTRAMUSCULAR | Status: DC | PRN

## 2012-08-15 MED ORDER — DEXTROSE 5 % IV SOLN
2000.0000 mg | INTRAVENOUS | Status: DC
  Filled 2012-08-15: qty 2

## 2012-08-15 MED ORDER — MORPHINE SULFATE ER 15 MG PO TBCR
15.0000 mg | EXTENDED_RELEASE_TABLET | Freq: Two times a day (BID) | ORAL | Status: DC
  Administered 2012-08-15 – 2012-08-19 (×8): 15 mg via ORAL
  Filled 2012-08-15 (×8): qty 1

## 2012-08-15 MED ORDER — POTASSIUM CHLORIDE CRYS ER 20 MEQ PO TBCR
20.0000 meq | EXTENDED_RELEASE_TABLET | Freq: Every morning | ORAL | Status: DC
  Administered 2012-08-16 – 2012-08-19 (×4): 20 meq via ORAL
  Filled 2012-08-15 (×4): qty 1

## 2012-08-15 MED ORDER — GABAPENTIN 300 MG PO CAPS
300.0000 mg | ORAL_CAPSULE | Freq: Every day | ORAL | Status: DC
  Filled 2012-08-15: qty 1

## 2012-08-15 MED ORDER — DEXTROSE-NACL 5-0.45 % IV SOLN
INTRAVENOUS | Status: DC
  Administered 2012-08-15 – 2012-08-18 (×7): via INTRAVENOUS

## 2012-08-15 MED ORDER — HYDROMORPHONE HCL PF 2 MG/ML IJ SOLN: 4.0000 mg | INTRAMUSCULAR | Status: DC | PRN

## 2012-08-15 MED ORDER — HYDROXYUREA 500 MG PO CAPS
1000.0000 mg | ORAL_CAPSULE | Freq: Every day | ORAL | Status: DC
  Administered 2012-08-16 – 2012-08-19 (×4): 1000 mg via ORAL
  Filled 2012-08-15 (×4): qty 2

## 2012-08-15 NOTE — H&P (Signed)
[facility] History and Physical   Date: 08/15/2012  Patient name: [name] Medical record number: [id number] Date of birth: 1980/09/28 Age: 32 y.o. Gender: male PCP: [name],[name], MD  Attending physician: Earlie Lou  MD  Chief Complaint:  Ribs and low back pain  History of Present Illness: This a patient is well known to me and is not narcotic naive. He has SS genotype sickle cell disease. PMH of pulmonary embolism , mood disorder and hypokalemia. He was d/c from the [facility] on yesterday went home and was unable to mange his pain at home. Not wanting to go to ED he called this AM requesting tx. Pt was recently d/c from  [facility] for vaso occlusive crisis from July 7-17, 2014.  He was admitted to the [facility] for acute management of symptoms. His pain is 8/10 stabbing and sharpe and presently clenching  Rib cage . He is unable to identify any palliative or provocative features.   Meds: Prescriptions prior to admission  Medication Sig Dispense Refill  . celecoxib (CELEBREX) 200 MG capsule Take 1 capsule (200 mg total) by mouth 2 (two) times daily as needed for pain.  30 capsule  0  . folic acid (FOLVITE) 1 MG tablet Take 1 tablet (1 mg total) by mouth every morning.  30 tablet  11  . gabapentin (NEURONTIN) 300 MG capsule Take 1 capsule (300 mg total) by mouth daily.  30 capsule  2  . HYDROmorphone (DILAUDID) 4 MG tablet Take 1 tablet (4 mg total) by mouth every 4 (four) hours as needed for pain.  90 tablet  0  . hydroxyurea (HYDREA) 500 MG capsule Take 2 capsules (1,000 mg total) by mouth daily. May take with food to minimize GI side effects.  60 capsule  1  . morphine (MS CONTIN) 15 MG 12 hr tablet Take 1 tablet (15 mg total) by mouth 2 (two) times daily.  60 tablet  0  . potassium chloride SA (K-DUR,KLOR-CON) 20 MEQ tablet Take 1 tablet (20 mEq total) by mouth every morning.  30 tablet  3  . Rivaroxaban (XARELTO) 20 MG TABS Take 20 mg by mouth daily.      Marland Kitchen  zolpidem (AMBIEN) 10 MG tablet Take 1 tablet (10 mg total) by mouth at bedtime as needed for sleep. For sleep  30 tablet  1  . naloxone (NARCAN) 1 MG/ML injection Route: Intrnasal Naloxone 2 mg/ml pre-filled syringe with naloxone kit.  Disp: #4 ml (2 Syringes) Sig: For suspected opioid overdose, spray 1 ml in each nostril as directed. Repeat after 3 minutes if no or minimal response  4 mL  1    Allergies: Morphine and related Past Medical History  Diagnosis Date  . Sickle cell anemia   . Blood transfusion   . 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 Hypokalemia   . Mood disorder   . Pulmonary embolism   . Avascular necrosis   . Leukocytosis     Chronic  . Thrombocytosis     Chronic  . Hypertension    Past Surgical History  Procedure Laterality Date  . Right hip replacement      08/2006  . Cholecystectomy      01/2008  . Porta cath placement    . Porta cath removal    . Umbilical hernia repair      01/2008  . Excision of left periauricular cyst      10/2009  .  Excision of right ear lobe cyst with primary closur      11/2007  . Portacath placement  01/05/2012    Procedure: INSERTION PORT-A-CATH;  Surgeon: Adolph Pollack, MD;  Location: [facility] OR;  Service: General;  Laterality: N/A;  ultrasound guiced port a cath insertion with fluoroscopy   Family History  Problem Relation Age of Onset  . Sickle cell anemia Mother   . Sickle cell anemia Father   . Sickle cell trait Brother    History   Social History  . Marital Status: Single    Spouse Name: N/A    Number of Children: 0  . Years of Education: 13   Occupational History  . Unemployed    Social History Main Topics  . Smoking status: Former Smoker -- 13 years    Types: Cigarettes    Quit date: 07/08/2010  . Smokeless tobacco: Never Used  . Alcohol Use: No  . Drug Use: No     Comment: quit smoking 2011  . Sexually Active: Yes -- Male partner(s)    Birth Control/ Protection: None    Other Topics Concern  . Not on file   Social History Narrative   Lives in an apartment.  Single.  Lives with girlfriend.  Does not use any assist devices.        [name]:  [phone] Mom, emergency contact    Review of Systems: A comprehensive review of systems was negative except for rib and back pain   Physical Exam: Blood pressure 115/74, pulse 60, temperature 98.5 F (36.9 C), temperature source Oral, resp. rate 17, height 6' (1.829 m), weight 173 lb (78.472 kg), SpO2 96.00%. BP 115/74  Pulse 60  Temp(Src) 98.5 F (36.9 C) (Oral)  Resp 17  Ht 6' (1.829 m)  Wt 173 lb (78.472 kg)  BMI 23.46 kg/m2  SpO2 96%  General Appearance:    Alert, cooperative, no distress, appears stated age  Head:    Normocephalic, without obvious abnormality, atraumatic  Eyes:    PERRL, conjunctiva/corneas clear, EOM's intact, mild icteric sclera   Ears:    Normal TM's and external ear canals, both ears have cerumen present   Nose:   Nares normal, septum midline, mucosa normal, no drainage    or sinus tenderness  Throat:   Lips, mucosa, and tongue normal; teeth and gums normal  Neck:   Supple, symmetrical, trachea midline, no adenopathy;       thyroid:  No enlargement/tenderness/nodules; no carotid   bruit or JVD  Back:     Symmetric, no curvature, ROM normal, no CVA tenderness  Lungs:     Clear to auscultation bilaterally, respirations unlabored  Chest wall:    No tenderness or deformity  Heart:    Regular rate and rhythm, S1 and S2 normal, no murmur, rub   or gallop  Abdomen:     Soft, non-tender, bowel sounds active all four quadrants,    no masses, no organomegaly  Genitalia:   denies priapism   Rectal:   deferred  Extremities:   Extremities normal, atraumatic, no cyanosis or edema  Pulses:   2+ and symmetric all extremities  Skin:   Skin color, texture, turgor normal, no rashes or lesions, tattoos present   Lymph nodes:   Cervical, supraclavicular, and axillary nodes normal   Neurologic:   CNII-XII intact. Normal strength,         Lab results: Results for orders placed during the hospital encounter of 08/15/12 (from the past 24  hour(s))  CBC WITH DIFFERENTIAL     Status: Abnormal   Collection Time    08/15/12 10:04 AM      Result Value Range   WBC 20.4 (*) 4.0 - 10.5 K/uL   RBC 2.38 (*) 4.22 - 5.81 MIL/uL   Hemoglobin 7.4 (*) 13.0 - 17.0 g/dL   HCT 16.1 (*) 09.6 - 04.5 %   MCV 91.6  78.0 - 100.0 fL   MCH 31.1  26.0 - 34.0 pg   MCHC 33.9  30.0 - 36.0 g/dL   RDW 40.9 (*) 81.1 - 91.4 %   Platelets 551 (*) 150 - 400 K/uL   Neutrophils Relative % 75  43 - 77 %   Lymphocytes Relative 10 (*) 12 - 46 %   Monocytes Relative 11  3 - 12 %   Eosinophils Relative 4  0 - 5 %   Basophils Relative 0  0 - 1 %   Neutro Abs 15.4 (*) 1.7 - 7.7 K/uL   Lymphs Abs 2.0  0.7 - 4.0 K/uL   Monocytes Absolute 2.2 (*) 0.1 - 1.0 K/uL   Eosinophils Absolute 0.8 (*) 0.0 - 0.7 K/uL   Basophils Absolute 0.0  0.0 - 0.1 K/uL   RBC Morphology SICKLE CELLS    RETICULOCYTES     Status: Abnormal   Collection Time    08/15/12 11:18 AM      Result Value Range   Retic Ct Pct 15.8 (*) 0.4 - 3.1 %   RBC. 2.41 (*) 4.22 - 5.81 MIL/uL   Retic Count, Manual 380.8 (*) 19.0 - 186.0 K/uL    Imaging results:  Dg Chest 2 View  08/13/2012   *RADIOLOGY REPORT*  Clinical Data: Left-sided chest pain.  CHEST - 2 VIEW  Comparison: 07/09/2012 CT.  Findings: Left subclavian power port is present with the position unchanged compared to prior.  The tip is in the superior vena cava just superior to the cavoatrial junction.  Cardiopericardial silhouette remains mildly enlarged for projection.  The chronic pulmonary parenchymal scarring is present with interstitial prominence for age.  No airspace disease.  No focal consolidation or effusion.  Cholecystectomy.  IMPRESSION:  1.  Cardiomegaly eight and interstitial changes compatible with sickle cell anemia. 2.  No radiographic findings of acute chest syndrome.    Original Report Authenticated By: Andreas Newport, M.D.     Assessment & Plan: 1.Vaso occlusive crisis/ Pain with hemolysis caused by hyperviscosity, sticky blood vessel and decreased oxygen carrying capacity. Treatment /plan is gently hydrate and place on  analgesic prn . Resume home meds hydrea, folic acid  and long acting analgesic. ( retic count and WBC slowly trending up) follow  2.  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 Adjunct therapy Toradol 30mg  Q 6hrs and followed  3. Hx of PE: on anticoagulant Xareto taken this AM 4. Constipation: BM this AM [name], [name] 08/15/2012, 3:23 PM  Patient seen and examined with [name], NP. Doreene Eland with above notes and treatment plan.

## 2012-08-15 NOTE — Telephone Encounter (Signed)
Received patient call. Patient states was told he could come back today if continues to have pain. Patient c/o side pain raing 8-9/10. Patient denies all other pertinent ROS per [facility] Triage Assessment. Informed patient that this RN will notify Provider and patient is to receive call back with recommendation. Patient Acknowledges understanding.

## 2012-08-15 NOTE — H&P (Signed)
[facility] History and Physical   Date: 08/15/2012  Patient name: [name] Medical record number: [id number] Date of birth: March 02, 1980 Age: 32 y.o. Gender: male PCP: [name],[name], MD  Attending physician: Rometta Emery, MD  Chief Complaint: bilateral rib pain and lower back   History of Present Illness: [name] is a 32 y/o [place] American male whom is well known to me and is not opoid naive . He has SS genotype sickle cell disease and a PMH of pulmonary embolism , mood disorder and hypokalemia. He has been aggressively treated in the [facility] for 2 consecutive days for  acute management of symptoms. His pain is still not under control and not able to manage pain at home at this time. He denies any chest pain, shortness of breath, dysuria, n/v/d. Continues to have sharp stabbing constant pain in his lower back and ribs rates his pain still 8/10. D/W Dr. Mikeal Hawthorne will admit to inpatien for more aggressive acute symptom management. ( Note recently d/c from [facility]  July 7-17, 2014 for Vaso occlusive crisis )    Meds: Prescriptions prior to admission  Medication Sig Dispense Refill  . celecoxib (CELEBREX) 200 MG capsule Take 1 capsule (200 mg total) by mouth 2 (two) times daily as needed for pain.  30 capsule  0  . folic acid (FOLVITE) 1 MG tablet Take 1 tablet (1 mg total) by mouth every morning.  30 tablet  11  . gabapentin (NEURONTIN) 300 MG capsule Take 1 capsule (300 mg total) by mouth daily.  30 capsule  2  . HYDROmorphone (DILAUDID) 4 MG tablet Take 1 tablet (4 mg total) by mouth every 4 (four) hours as needed for pain.  90 tablet  0  . hydroxyurea (HYDREA) 500 MG capsule Take 2 capsules (1,000 mg total) by mouth daily. May take with food to minimize GI side effects.  60 capsule  1  . morphine (MS CONTIN) 15 MG 12 hr tablet Take 1 tablet (15 mg total) by mouth 2 (two) times daily.  60 tablet  0  . potassium chloride SA (K-DUR,KLOR-CON) 20 MEQ tablet Take  1 tablet (20 mEq total) by mouth every morning.  30 tablet  3  . Rivaroxaban (XARELTO) 20 MG TABS Take 20 mg by mouth daily.      Marland Kitchen zolpidem (AMBIEN) 10 MG tablet Take 1 tablet (10 mg total) by mouth at bedtime as needed for sleep. For sleep  30 tablet  1  . naloxone (NARCAN) 1 MG/ML injection Route: Intrnasal Naloxone 2 mg/ml pre-filled syringe with naloxone kit.  Disp: #4 ml (2 Syringes) Sig: For suspected opioid overdose, spray 1 ml in each nostril as directed. Repeat after 3 minutes if no or minimal response  4 mL  1    Allergies: Morphine and related Past Medical History  Diagnosis Date  . Sickle cell anemia   . Blood transfusion   . 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 Hypokalemia   . Mood disorder   . Pulmonary embolism   . Avascular necrosis   . Leukocytosis     Chronic  . Thrombocytosis     Chronic  . Hypertension    Past Surgical History  Procedure Laterality Date  . Right hip replacement      08/2006  . Cholecystectomy      01/2008  . Porta cath placement    . Porta cath removal    . Umbilical hernia  repair      01/2008  . Excision of left periauricular cyst      10/2009  . Excision of right ear lobe cyst with primary closur      11/2007  . Portacath placement  01/05/2012    Procedure: INSERTION PORT-A-CATH;  Surgeon: Adolph Pollack, MD;  Location: [facility] OR;  Service: General;  Laterality: N/A;  ultrasound guiced port a cath insertion with fluoroscopy   Family History  Problem Relation Age of Onset  . Sickle cell anemia Mother   . Sickle cell anemia Father   . Sickle cell trait Brother    History   Social History  . Marital Status: Single    Spouse Name: N/A    Number of Children: 0  . Years of Education: 13   Occupational History  . Unemployed    Social History Main Topics  . Smoking status: Former Smoker -- 13 years    Types: Cigarettes    Quit date: 07/08/2010  . Smokeless tobacco: Never Used  . Alcohol Use: No   . Drug Use: No     Comment: quit smoking 2011  . Sexually Active: Yes -- Male partner(s)    Birth Control/ Protection: None   Other Topics Concern  . Not on file   Social History Narrative   Lives in an apartment.  Single.  Lives with girlfriend.  Does not use any assist devices.        [name]:  [phone] Mom, emergency contact    Review of Systems: Pertinent items are noted in HPI.  Physical Exam: Blood pressure 115/74, pulse 73, temperature 98.8 F (37.1 C), temperature source Oral, resp. rate 20, height 6' (1.829 m), weight 173 lb (78.472 kg), SpO2 96.00%. BP 115/74  Pulse 64  Temp(Src) 98.8 F (37.1 C) (Oral)  Resp 17  Ht 6' (1.829 m)  Wt 173 lb (78.472 kg)  BMI 23.46 kg/m2  SpO2 96%  General Appearance:    Alert, cooperative, no distress, appears stated age  Head:    Normocephalic, without obvious abnormality, atraumatic  Eyes:    PERRL, conjunctiva/corneas clear, EOM's intact, fundi    benign, both eyes       Ears:    Normal TM's and external ear canals, both earscerumen present  Nose:   Nares normal, septum midline, mucosa normal, no drainage    or sinus tenderness  Throat:   Lips, mucosa, and tongue normal; teeth and gums normal  Neck:   Supple, symmetrical, trachea midline, no adenopathy;       thyroid:  No enlargement/tenderness/nodules; no carotid   bruit or JVD  Back:     Symmetric, no curvature, ROM normal, no CVA tenderness  Lungs:     Clear to auscultation bilaterally, respirations unlabored, no vocal fremitus   Chest wall:    No tenderness or deformity  Heart:    Regular rate and rhythm, S1 and S2 normal, no murmur, rub   or gallop  Abdomen:     Soft, non-tender, bowel sounds active all four quadrants,    no masses, no organomegaly  Genitalia:   denies priapism   Rectal:  Deferred   Extremities:   Extremities normal, atraumatic, no cyanosis or edema  Pulses:   2+ and symmetric all extremities  Skin:   Skin color, texture, turgor  normal, no rashes or lesions  Lymph nodes:   Cervical, supraclavicular  Neurologic:   CNII-XII intact. Normal strength    Lab results: Results for orders  placed during the hospital encounter of 08/15/12 (from the past 24 hour(s))  CBC WITH DIFFERENTIAL     Status: Abnormal   Collection Time    08/15/12 10:04 AM      Result Value Range   WBC 20.4 (*) 4.0 - 10.5 K/uL   RBC 2.38 (*) 4.22 - 5.81 MIL/uL   Hemoglobin 7.4 (*) 13.0 - 17.0 g/dL   HCT 08.6 (*) 57.8 - 46.9 %   MCV 91.6  78.0 - 100.0 fL   MCH 31.1  26.0 - 34.0 pg   MCHC 33.9  30.0 - 36.0 g/dL   RDW 62.9 (*) 52.8 - 41.3 %   Platelets 551 (*) 150 - 400 K/uL   Neutrophils Relative % 75  43 - 77 %   Lymphocytes Relative 10 (*) 12 - 46 %   Monocytes Relative 11  3 - 12 %   Eosinophils Relative 4  0 - 5 %   Basophils Relative 0  0 - 1 %   Neutro Abs 15.4 (*) 1.7 - 7.7 K/uL   Lymphs Abs 2.0  0.7 - 4.0 K/uL   Monocytes Absolute 2.2 (*) 0.1 - 1.0 K/uL   Eosinophils Absolute 0.8 (*) 0.0 - 0.7 K/uL   Basophils Absolute 0.0  0.0 - 0.1 K/uL   RBC Morphology SICKLE CELLS    RETICULOCYTES     Status: Abnormal   Collection Time    08/15/12 11:18 AM      Result Value Range   Retic Ct Pct 15.8 (*) 0.4 - 3.1 %   RBC. 2.41 (*) 4.22 - 5.81 MIL/uL   Retic Count, Manual 380.8 (*) 19.0 - 186.0 K/uL    Imaging results:  No results found.   Assessment & Plan: 1.Vaso occlusive crisis/ Pain with hemolysis caused by hyperviscosity, sticky blood vessel and decreased oxygen carrying capacity. Treatment /plan is gently hydrate and place on PCA and prn break thru analgesic. Continue longacting analgesic and   Hydrea.  2. HX of Hypokalemia:  resume home dose 3. 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 Adjunct therapy Toradol 30mg  Q 6hrs and followed  4. 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 Mood disorder: appropriate at this time resume Zyprexa 6.  Insomnia: Ambien 10mg  Qhs     [name], [name] 08/15/2012, 5:55 PM  Agree with notes above. Will continue care as in patient.

## 2012-08-15 NOTE — Telephone Encounter (Addendum)
Instructed patient to come in to [facility]. Patient acknowledges understanding.

## 2012-08-16 DIAGNOSIS — M545 Low back pain: Secondary | ICD-10-CM | POA: Diagnosis not present

## 2012-08-16 DIAGNOSIS — F39 Unspecified mood [affective] disorder: Secondary | ICD-10-CM | POA: Diagnosis not present

## 2012-08-16 DIAGNOSIS — R52 Pain, unspecified: Secondary | ICD-10-CM | POA: Diagnosis not present

## 2012-08-16 DIAGNOSIS — I2782 Chronic pulmonary embolism: Secondary | ICD-10-CM | POA: Diagnosis not present

## 2012-08-16 DIAGNOSIS — D5700 Hb-SS disease with crisis, unspecified: Secondary | ICD-10-CM | POA: Diagnosis not present

## 2012-08-16 DIAGNOSIS — D72829 Elevated white blood cell count, unspecified: Secondary | ICD-10-CM | POA: Diagnosis not present

## 2012-08-16 LAB — CBC WITH DIFFERENTIAL/PLATELET
Basophils Relative: 1 % (ref 0–1)
Eosinophils Absolute: 1 10*3/uL — ABNORMAL HIGH (ref 0.0–0.7)
Eosinophils Relative: 5 % (ref 0–5)
HCT: 20.7 % — ABNORMAL LOW (ref 39.0–52.0)
Hemoglobin: 7.1 g/dL — ABNORMAL LOW (ref 13.0–17.0)
Lymphocytes Relative: 20 % (ref 12–46)
MCH: 31.7 pg (ref 26.0–34.0)
MCHC: 34.3 g/dL (ref 30.0–36.0)
Neutro Abs: 11.8 10*3/uL — ABNORMAL HIGH (ref 1.7–7.7)
RBC: 2.24 MIL/uL — ABNORMAL LOW (ref 4.22–5.81)

## 2012-08-16 LAB — COMPREHENSIVE METABOLIC PANEL
ALT: 17 U/L (ref 0–53)
Albumin: 3.2 g/dL — ABNORMAL LOW (ref 3.5–5.2)
Alkaline Phosphatase: 71 U/L (ref 39–117)
Potassium: 3.3 mEq/L — ABNORMAL LOW (ref 3.5–5.1)
Sodium: 137 mEq/L (ref 135–145)
Total Protein: 6.8 g/dL (ref 6.0–8.3)

## 2012-08-16 LAB — MAGNESIUM: Magnesium: 1.7 mg/dL (ref 1.5–2.5)

## 2012-08-16 IMAGING — CR DG CHEST 2V
2 series · 2 of 2 positions shown · non-contrast
Comparison: 09/21/2009

CLINICAL DATA: Sickle cell crisis.

CHEST - 2 VIEW

[w chest pa]
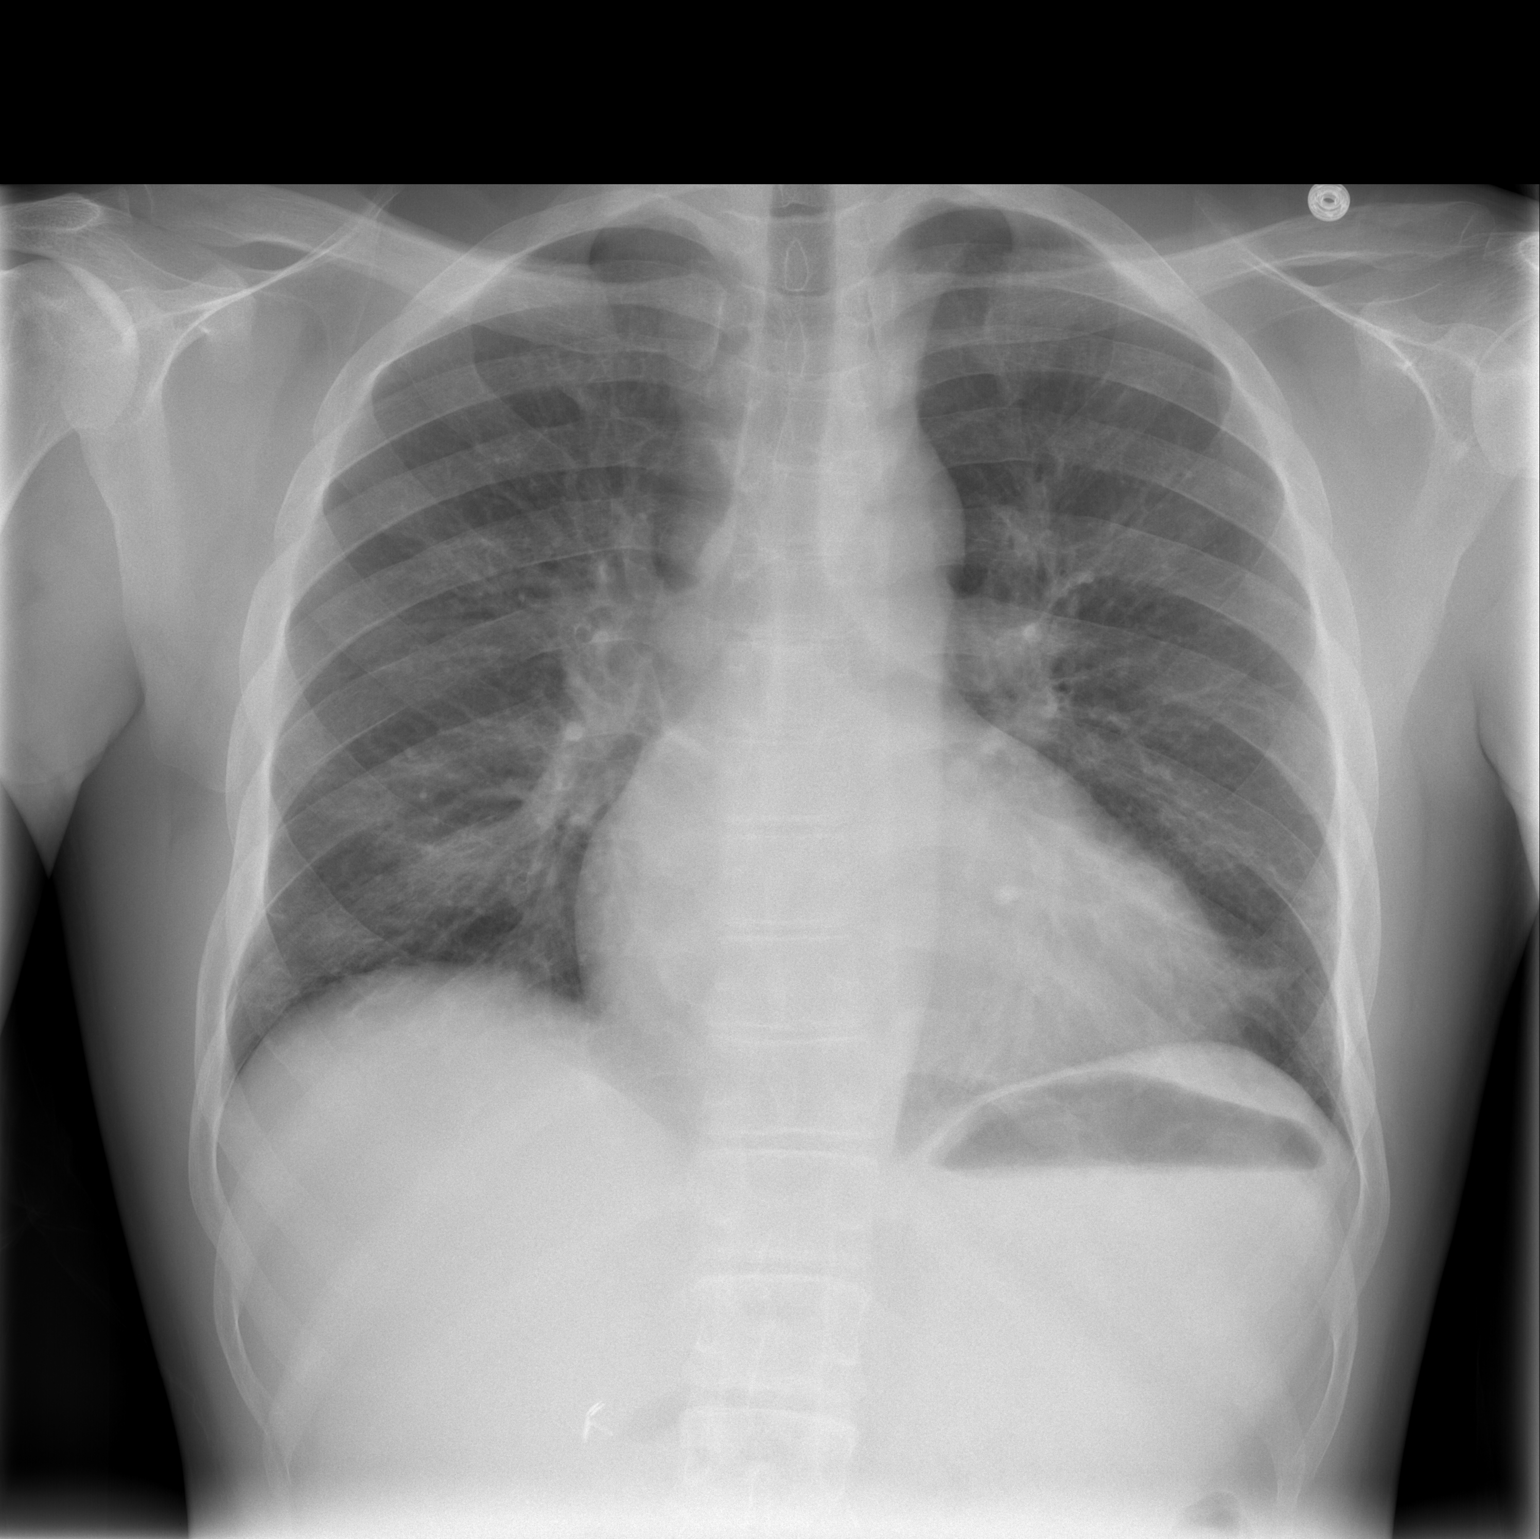

[w chest lat]
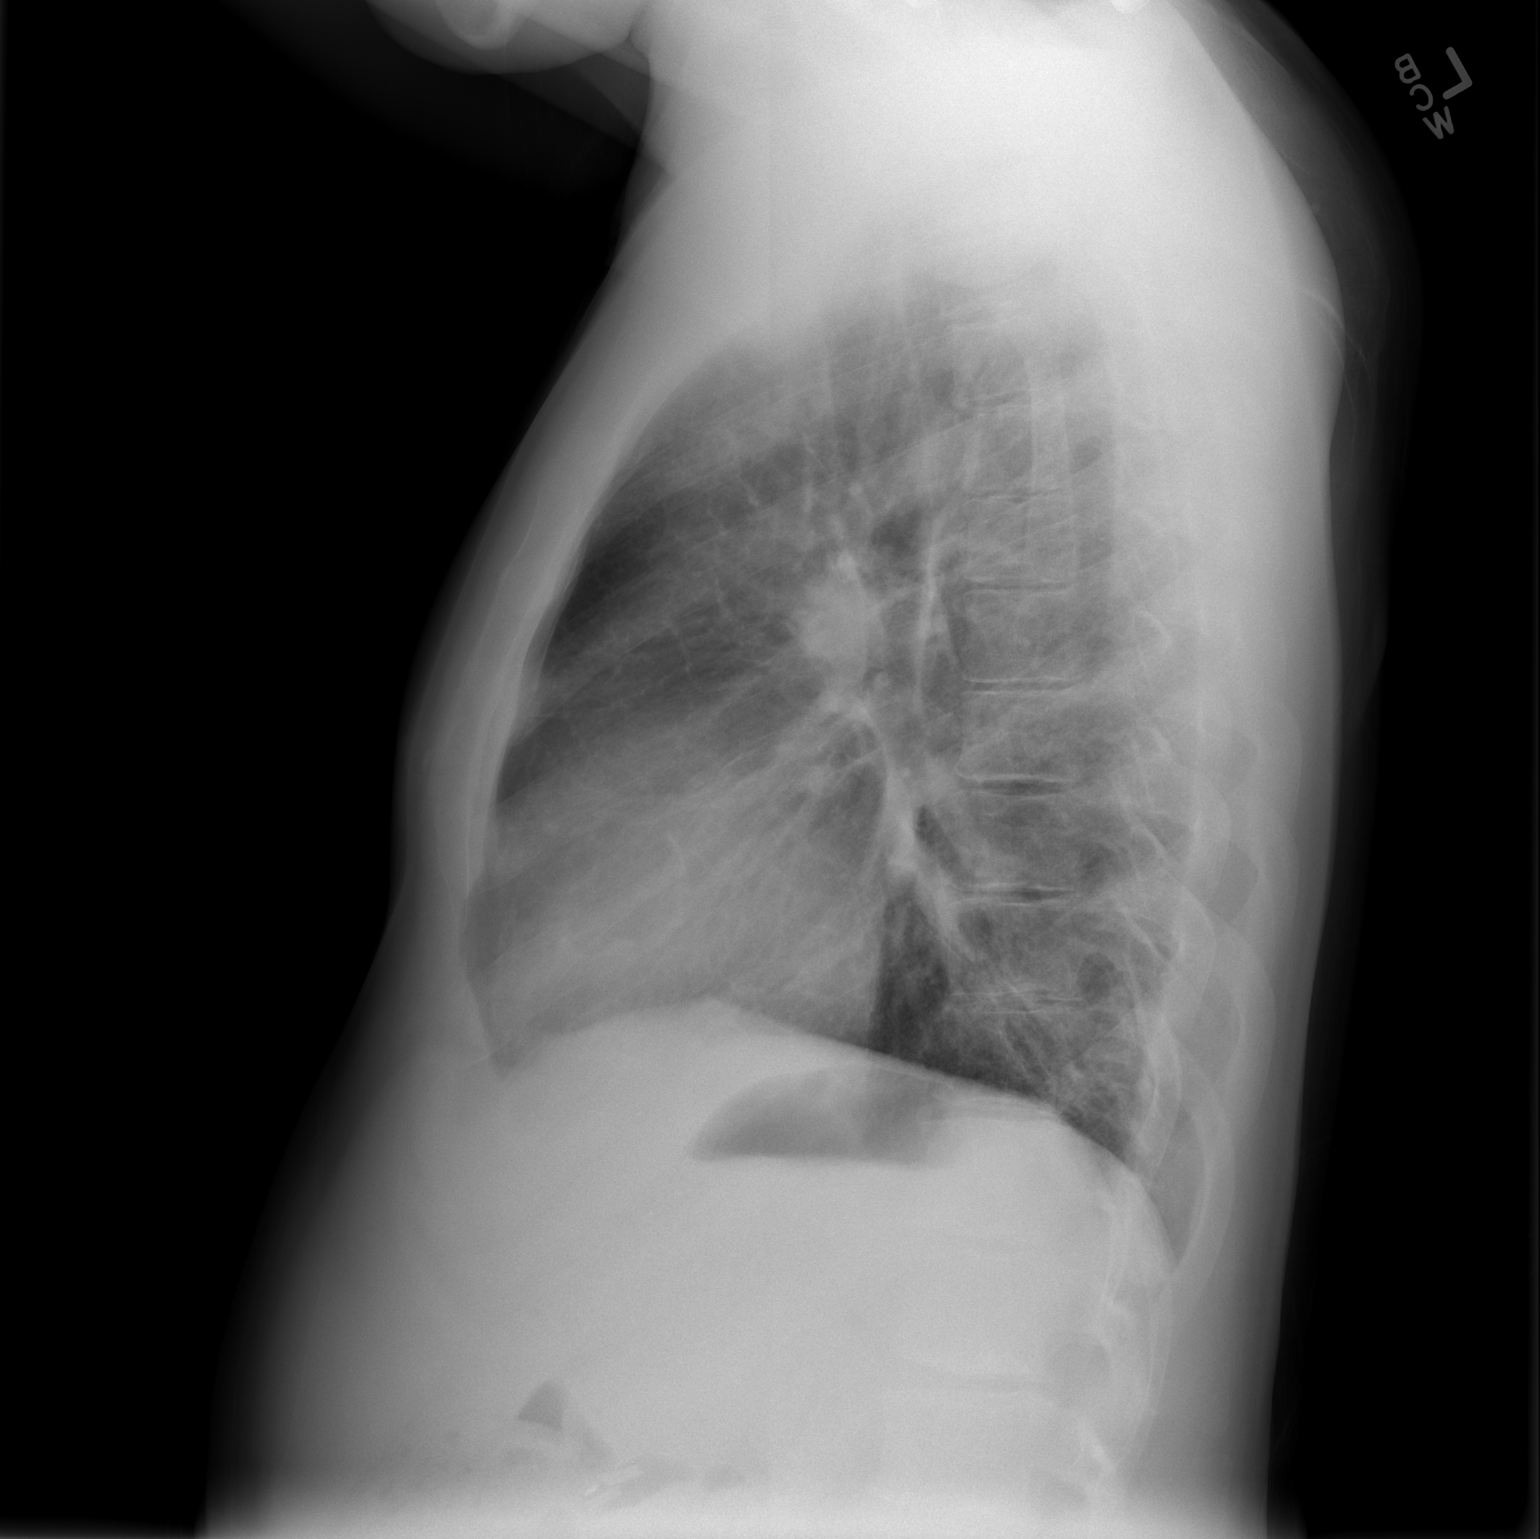

[2 of 2 positions shown; findings below may reference images not displayed]

FINDINGS: Heart is mildly enlarged.  No confluent opacities or
effusions in the lungs.  No acute bony abnormality.  Visualized
upper abdomen unremarkable.
IMPRESSION: Mild cardiomegaly.  No active disease.

## 2012-08-16 MED ORDER — HYDROMORPHONE HCL PF 2 MG/ML IJ SOLN: 4.0000 mg | INTRAMUSCULAR | Status: DC | PRN

## 2012-08-16 MED ORDER — HYDROMORPHONE HCL PF 2 MG/ML IJ SOLN
2.0000 mg | INTRAMUSCULAR | Status: DC | PRN
  Administered 2012-08-16 – 2012-08-18 (×20): 4 mg via INTRAVENOUS
  Filled 2012-08-16 (×8): qty 2
  Filled 2012-08-16: qty 1
  Filled 2012-08-16 (×11): qty 2

## 2012-08-17 DIAGNOSIS — D5700 Hb-SS disease with crisis, unspecified: Secondary | ICD-10-CM | POA: Diagnosis not present

## 2012-08-17 DIAGNOSIS — R52 Pain, unspecified: Secondary | ICD-10-CM | POA: Diagnosis not present

## 2012-08-17 DIAGNOSIS — M545 Low back pain: Secondary | ICD-10-CM | POA: Diagnosis not present

## 2012-08-17 DIAGNOSIS — I2782 Chronic pulmonary embolism: Secondary | ICD-10-CM | POA: Diagnosis not present

## 2012-08-17 DIAGNOSIS — F39 Unspecified mood [affective] disorder: Secondary | ICD-10-CM | POA: Diagnosis not present

## 2012-08-17 DIAGNOSIS — D72829 Elevated white blood cell count, unspecified: Secondary | ICD-10-CM | POA: Diagnosis not present

## 2012-08-17 LAB — COMPREHENSIVE METABOLIC PANEL
ALT: 14 U/L (ref 0–53)
AST: 19 U/L (ref 0–37)
Albumin: 3.3 g/dL — ABNORMAL LOW (ref 3.5–5.2)
Alkaline Phosphatase: 72 U/L (ref 39–117)
Chloride: 107 mEq/L (ref 96–112)
Potassium: 3.5 mEq/L (ref 3.5–5.1)
Total Bilirubin: 2.5 mg/dL — ABNORMAL HIGH (ref 0.3–1.2)

## 2012-08-17 LAB — CBC WITH DIFFERENTIAL/PLATELET
Basophils Absolute: 0.2 10*3/uL — ABNORMAL HIGH (ref 0.0–0.1)
Eosinophils Relative: 10 % — ABNORMAL HIGH (ref 0–5)
Lymphs Abs: 4.2 10*3/uL — ABNORMAL HIGH (ref 0.7–4.0)
MCH: 32 pg (ref 26.0–34.0)
MCV: 92 fL (ref 78.0–100.0)
Monocytes Absolute: 2.8 10*3/uL — ABNORMAL HIGH (ref 0.1–1.0)
Platelets: 514 10*3/uL — ABNORMAL HIGH (ref 150–400)
RDW: 22 % — ABNORMAL HIGH (ref 11.5–15.5)

## 2012-08-17 IMAGING — CT CT ANGIO CHEST
2 of 6 series · 19 of 36 positions shown · IV contrast (APPLIED)
Comparison: CT chest 09/23/2009

CLINICAL DATA: Sickle cell crisis, concern for pulmonary embolism

CT ANGIOGRAPHY CHEST WITH CONTRAST
TECHNIQUE: Multidetector CT imaging of the chest was performed
using the standard protocol during bolus administration of
intravenous contrast.  Multiplanar CT image reconstructions
including MIPs were obtained to evaluate the vascular anatomy.
Contrast:  80 ml Omnipaque diameter

[Series 6: pe thins @ 1mm · axial · 0.74mm/px · z∈[-283,-35]mm · 18 of 276 slices shown]
[im 14/276  lung]
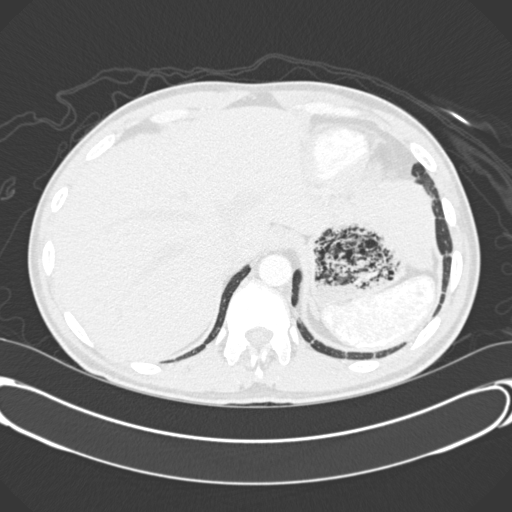
[im 28/276  mediastinal]
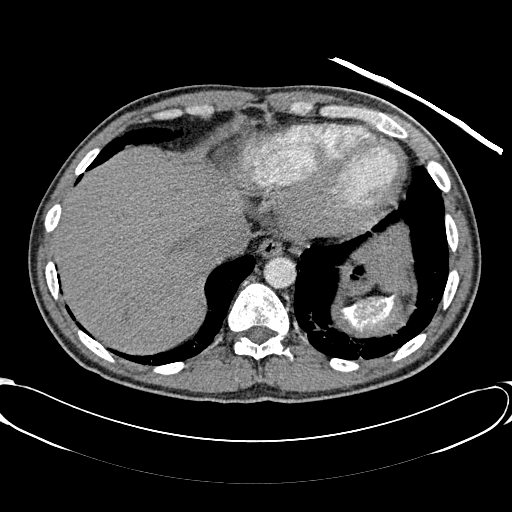
[im 42/276  lung]
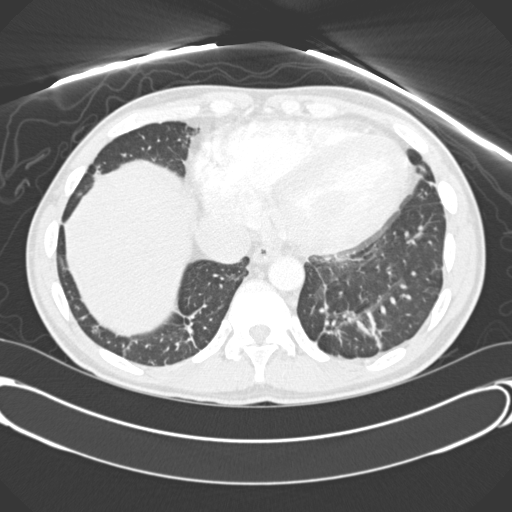
[im 56/276  mediastinal]
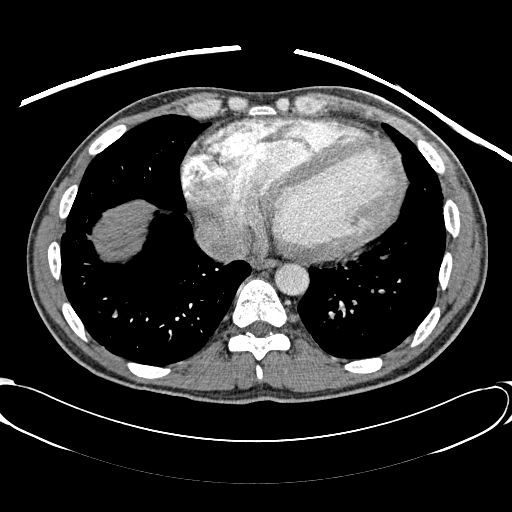
[im 69/276  lung]
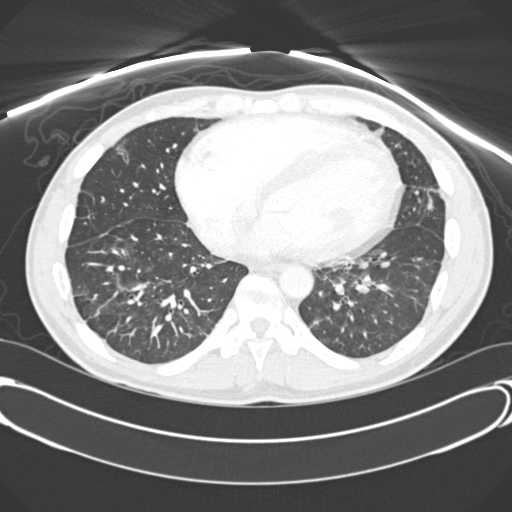
[im 83/276  mediastinal]
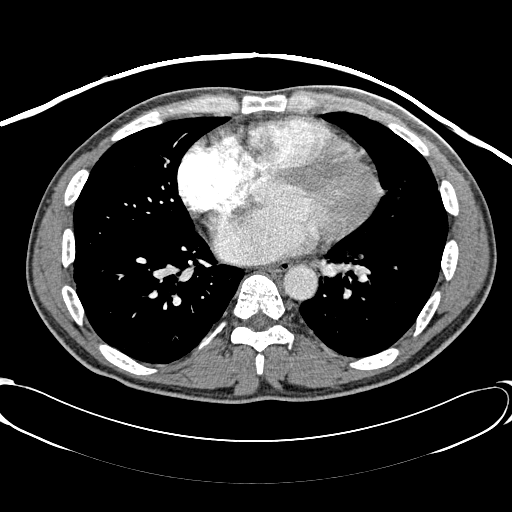
[im 97/276  lung]
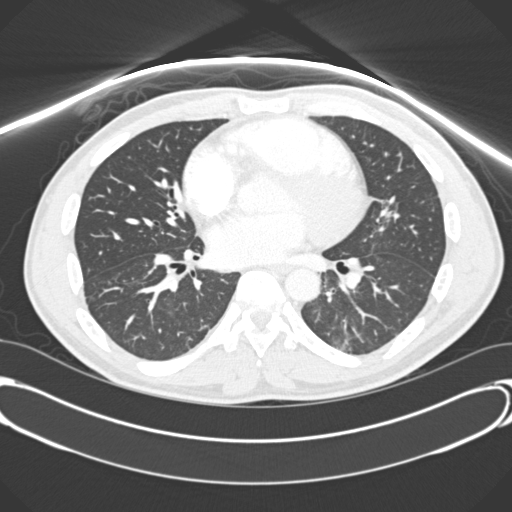
[im 111/276  mediastinal]
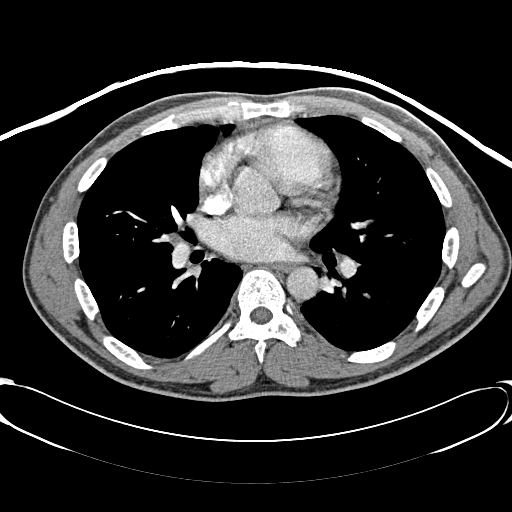
[im 124/276  lung]
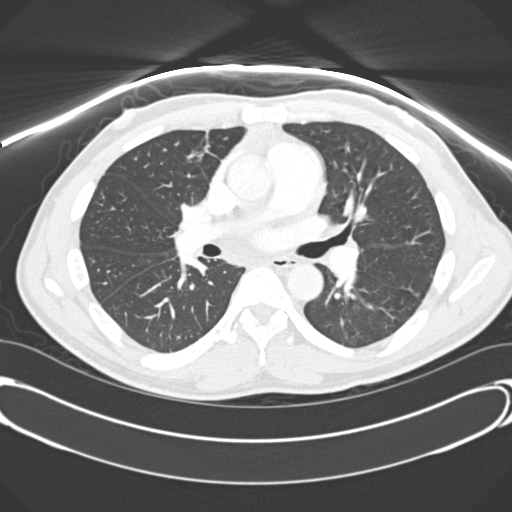
[im 152/276  mediastinal]
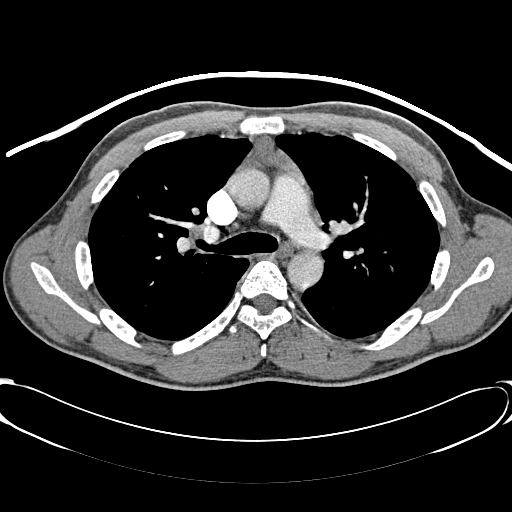
[im 166/276  lung]
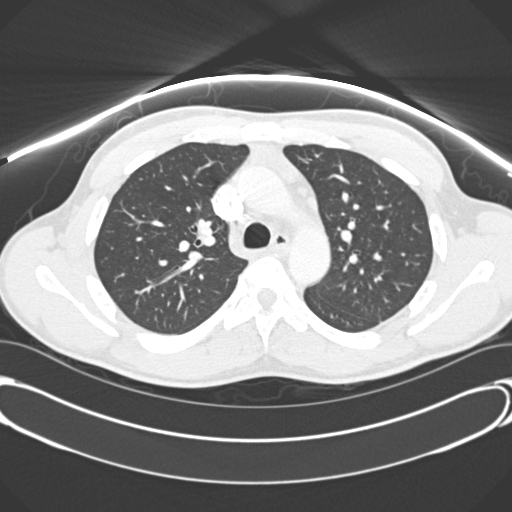
[im 179/276  mediastinal]
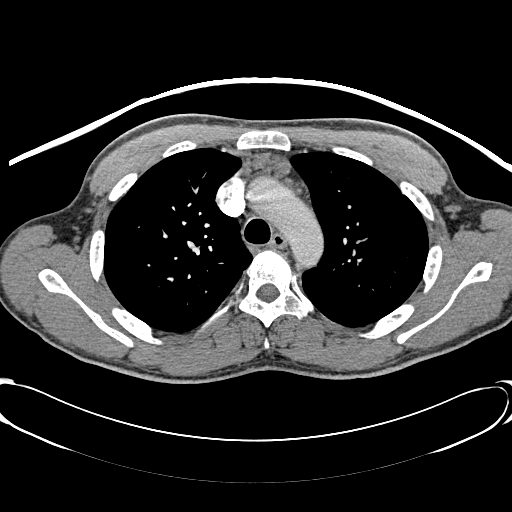
[im 193/276  lung]
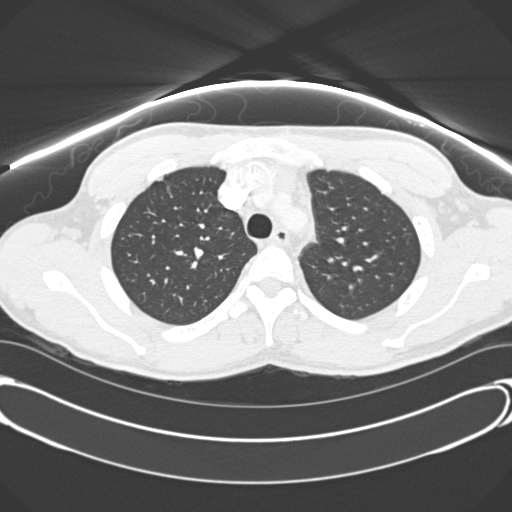
[im 207/276  mediastinal]
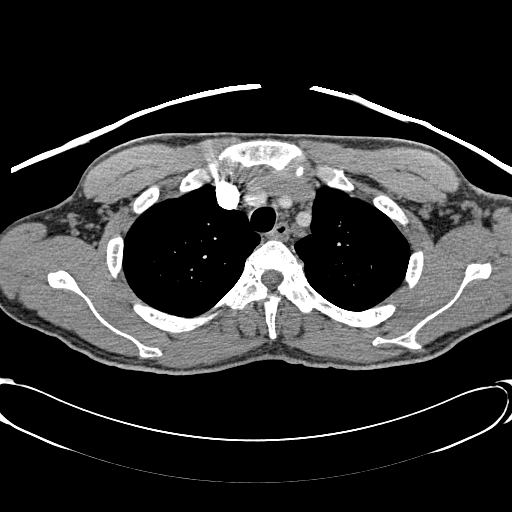
[im 221/276  lung]
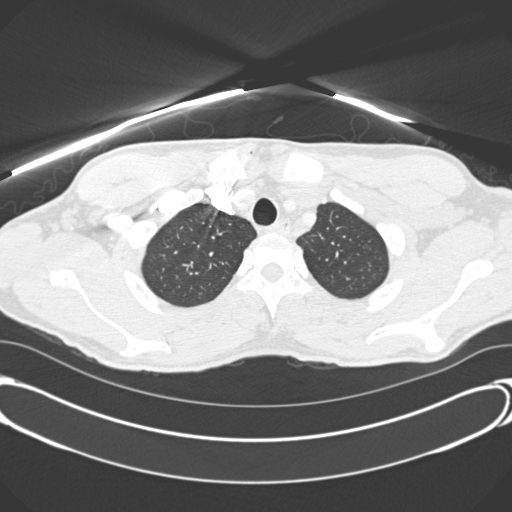
[im 234/276  mediastinal]
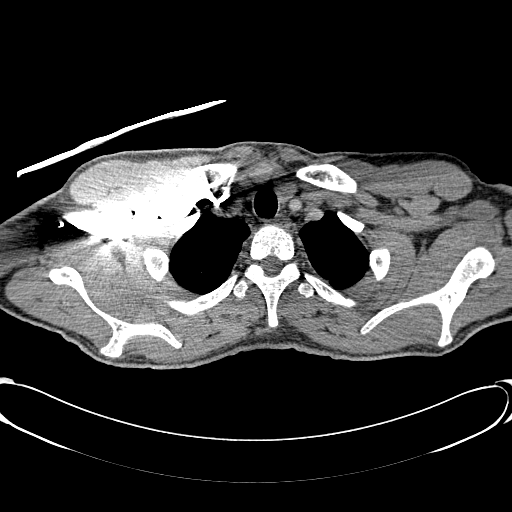
[im 248/276  lung]
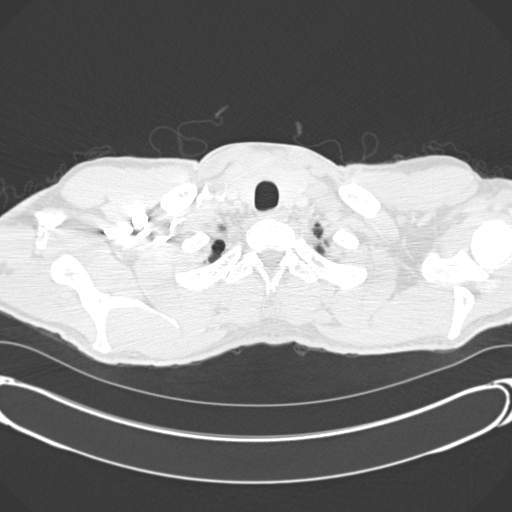
[im 262/276  mediastinal]
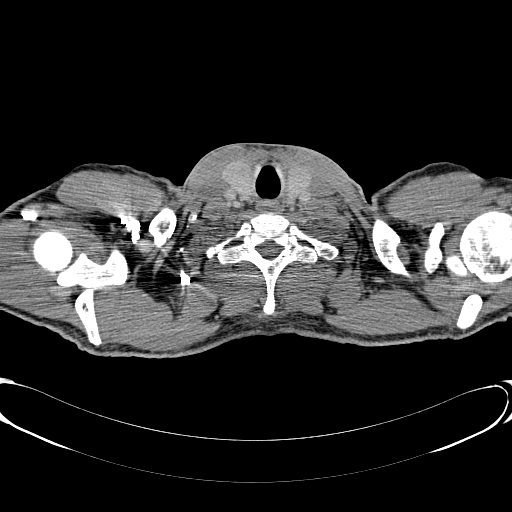

[Series 602: coronal mpr · coronal · 0.74mm/px · 1 of 112 slices shown]
[im 56/112  mediastinal]
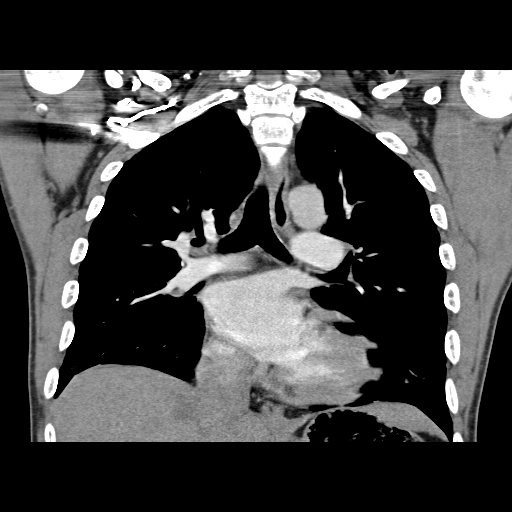

[19 of 36 positions shown; findings below may reference images not displayed]

FINDINGS: No evidence of filling defects within the pulmonary
arteries to suggest acute pulmonary embolism.  No acute findings of
the aorta or great vessels.

There is prevascular lymphadenopathy which is similar to prior.
The heart is enlarged.  No pericardial effusion.

Review of the lung parenchyma demonstrates no focal consolidation.
There is linear scarring at the bilateral lung bases and mild
bronchiectasis.

Limited view of the upper abdomen demonstrates splenic changes
consistent sickle cell disease.

The skeleton demonstrates diffuse sclerosis also consistent with
sickle cell disease.

Review of the MIP images confirms the above findings.
IMPRESSION: 1.  No evidence of acute pulmonary embolism.
2.  No evidence of pneumonia.
3.  Mediastinal lymphadenopathy is similar to prior.
4.  Cardiomegaly.

## 2012-08-17 NOTE — Progress Notes (Signed)
Subjective: 32 year old gentleman admitted with sickle cell painful crisis. He is doing better now on his current regimen. His potassium has dropped. He is on PCA pump from the clinic. Also on Toradol on his home medications. No shortness of breath no cough no fever. He denies any nausea vomiting or diarrhea.  Objective: Vital signs in last 24 hours: Temp:  [97.8 F (36.6 C)-98.6 F (37 C)] 98.4 F (36.9 C) (07/18 1010) Pulse Rate:  [69-80] 69 (07/18 1010) Resp:  [16-18] 16 (07/18 1010) BP: (120-133)/(80-86) 120/86 mmHg (07/18 1010) SpO2:  [97 %-100 %] 99 % (07/18 1010) Weight change:  Last BM Date: 08/15/12  Intake/Output from previous day: 07/17 0701 - 07/18 0700 In: 2871 [P.O.:480; I.V.:2391] Out: 2950 [Urine:2950] Intake/Output this shift: Total I/O In: 240 [P.O.:240] Out: 1450 [Urine:1450]  General appearance: alert, cooperative and no distress Eyes: conjunctivae/corneas clear. PERRL, EOM's intact. Fundi benign. Neck: no adenopathy, no carotid bruit, no JVD, supple, symmetrical, trachea midline and thyroid not enlarged, symmetric, no tenderness/mass/nodules Back: symmetric, no curvature. ROM normal. No CVA tenderness. Resp: clear to auscultation bilaterally Chest wall: no tenderness Cardio: regular rate and rhythm, S1, S2 normal, no murmur, click, rub or gallop GI: soft, non-tender; bowel sounds normal; no masses,  no organomegaly Extremities: extremities normal, atraumatic, no cyanosis or edema Pulses: 2+ and symmetric Skin: Skin color, texture, turgor normal. No rashes or lesions Neurologic: Grossly normal  Lab Results:  Recent Labs  08/16/12 0600 08/17/12 0505  WBC 19.1* 20.2*  HGB 7.1* 7.2*  HCT 20.7* 20.7*  PLT 476* 514*   BMET  Recent Labs  08/16/12 0600 08/17/12 0505  NA 137 137  K 3.3* 3.5  CL 105 107  CO2 24 23  GLUCOSE 106* 116*  BUN 4* 4*  CREATININE 0.54 0.57  CALCIUM 8.5 8.6    Studies/Results: No results found.  Medications: I  have reviewed the patient's current medications.  Assessment/Plan: A 32 year old gentleman admitted with sickle cell painful crisis.  #1 sickle cell painful crisis: Continue his pain control at this point. Patient has been taken off of his Dilaudid PCA and has now getting 4mg  Dilaudid IV every 2 hours. We will decrease the dose to 2 mg every 2 hours.  #2 sickle cell anemia: His H&H seems to be stable. We'll continue to monitor.   #3 leukocytosis: Most likely secondary to sickle cell crisis. The white count is stable.  #4 history of pulmonary embolism: He is on Xarelto. Continue.  #5 mood disorder: Continue olanzapine.  LOS: 2 days   [name],[name] 08/17/2012, 3:22 PM

## 2012-08-17 NOTE — Progress Notes (Signed)
Subjective: Patient is still having severe pain rated as 9/10. He's not getting enough of Dilaudid through the PCA pump. He denied chest pain shortness of breath no cough. Denied nausea vomiting or diarrhea. He has been doing fine otherwise. Has been tried on outpatient therapy at the sickle cell clinic but no relief for 2 consecutive days.  Objective: Vital signs in last 24 hours: Temp:  [97.8 F (36.6 C)-98.6 F (37 C)] 98.4 F (36.9 C) (07/17 1010) Pulse Rate:  [69-80] 69 (07/17 1010) Resp:  [16-18] 16 (07/18 1010) BP: (120-133)/(80-86) 120/86 mmHg (07/17 1010) SpO2:  [97 %-100 %] 99 % (07/17 1010) Weight change:  Last BM Date: 08/15/12  Intake/Output from previous day: 07/16 0701 - 07/17 0700 In: 2871 [P.O.:480; I.V.:2391] Out: 2950 [Urine:2950] Intake/Output this shift: Total I/O In: 240 [P.O.:240] Out: 1450 [Urine:1450]  General appearance: alert, cooperative and no distress Eyes: conjunctivae/corneas clear. PERRL, EOM's intact. Fundi benign. Neck: no adenopathy, no carotid bruit, no JVD, supple, symmetrical, trachea midline and thyroid not enlarged, symmetric, no tenderness/mass/nodules Back: symmetric, no curvature. ROM normal. No CVA tenderness. Resp: clear to auscultation bilaterally Chest wall: no tenderness Cardio: regular rate and rhythm, S1, S2 normal, no murmur, click, rub or gallop GI: soft, non-tender; bowel sounds normal; no masses,  no organomegaly Extremities: extremities normal, atraumatic, no cyanosis or edema Pulses: 2+ and symmetric Skin: Skin color, texture, turgor normal. No rashes or lesions Neurologic: Grossly normal  Lab Results:  Recent Labs  08/16/12 0600  WBC 19.1*  HGB 7.1*  HCT 20.7*  PLT 476*   BMET  Recent Labs  08/16/12 0600  NA 137  K 3.3*  CL 105  CO2 24  GLUCOSE 106*  BUN 4*  CREATININE 0.54  CALCIUM 8.5    Studies/Results: No results found.  Medications: I have reviewed the patient's current  medications.  Assessment/Plan: A 32 year old gentleman admitted with sickle cell painful crisis.  #1 sickle cell painful crisis: I will DC the Dilaudid PCA pump patient of 4 mg Dilaudid every 2 hours when necessary. I will gradually taper him off.  #2 sickle cell anemia: His H&H seems to be stable. We'll continue to monitor.   #3 leukocytosis: Most likely secondary to sickle cell crisis. The white count is stable.  #4 history of pulmonary embolism: He is on Xarelto. Continue.  #5 mood disorder: Continue olanzapine.  #6 hypokalemia: We'll replete his potassium  LOS: 1 day   Burrel Legrand,[name] 08/16/2012, 1:35 PM

## 2012-08-18 ENCOUNTER — Inpatient Hospital Stay (HOSPITAL_COMMUNITY): Payer: Medicare Other

## 2012-08-18 DIAGNOSIS — R05 Cough: Secondary | ICD-10-CM | POA: Diagnosis not present

## 2012-08-18 DIAGNOSIS — I2782 Chronic pulmonary embolism: Secondary | ICD-10-CM | POA: Diagnosis not present

## 2012-08-18 DIAGNOSIS — R52 Pain, unspecified: Secondary | ICD-10-CM | POA: Diagnosis not present

## 2012-08-18 DIAGNOSIS — F39 Unspecified mood [affective] disorder: Secondary | ICD-10-CM | POA: Diagnosis not present

## 2012-08-18 DIAGNOSIS — D72829 Elevated white blood cell count, unspecified: Secondary | ICD-10-CM | POA: Diagnosis not present

## 2012-08-18 DIAGNOSIS — D5700 Hb-SS disease with crisis, unspecified: Secondary | ICD-10-CM

## 2012-08-18 DIAGNOSIS — M545 Low back pain: Secondary | ICD-10-CM | POA: Diagnosis not present

## 2012-08-18 LAB — COMPREHENSIVE METABOLIC PANEL
Alkaline Phosphatase: 72 U/L (ref 39–117)
BUN: 5 mg/dL — ABNORMAL LOW (ref 6–23)
GFR calc Af Amer: >90 mL/min (ref >90)
GFR calc non Af Amer: >90 mL/min (ref >90)
Glucose, Bld: 109 mg/dL — ABNORMAL HIGH (ref 70–99)
Potassium: 3.9 mEq/L (ref 3.5–5.1)
Total Protein: 6.7 g/dL (ref 6.0–8.3)

## 2012-08-18 MED ORDER — HYDROMORPHONE HCL PF 1 MG/ML IJ SOLN
1.0000 mg | INTRAMUSCULAR | Status: DC | PRN
Start: 2012-08-18 — End: 2012-08-19
  Administered 2012-08-18: 2 mg via INTRAVENOUS
  Administered 2012-08-18: 1 mg via INTRAVENOUS
  Administered 2012-08-18 – 2012-08-19 (×7): 2 mg via INTRAVENOUS
  Filled 2012-08-18 (×9): qty 2

## 2012-08-18 NOTE — Progress Notes (Signed)
Subjective: 32 year old gentleman admitted with sickle cell painful crisis. He is doing better now on his current regimen. No shortness of breath no cough no fever. He denies any nausea vomiting or diarrhea. His pain is at 6/10.  Objective: Vital signs in last 24 hours: Temp:  [98.2 F (36.8 C)-98.7 F (37.1 C)] 98.7 F (37.1 C) (07/19 1317) Pulse Rate:  [63-84] 84 (07/19 1317) Resp:  [16-18] 18 (07/19 1317) BP: (117-140)/(77-86) 140/86 mmHg (07/19 1317) SpO2:  [96 %-100 %] 100 % (07/19 1317) Weight change:  Last BM Date: 08/15/12  Intake/Output from previous day: 07/18 0701 - 07/19 0700 In: 600 [P.O.:600] Out: 3550 [Urine:3550] Intake/Output this shift: Total I/O In: 240 [P.O.:240] Out: 500 [Urine:500]  General appearance: alert, cooperative and no distress Eyes: conjunctivae/corneas clear. PERRL, EOM's intact. Fundi benign. Neck: no adenopathy, no carotid bruit, no JVD, supple, symmetrical, trachea midline and thyroid not enlarged, symmetric, no tenderness/mass/nodules Back: symmetric, no curvature. ROM normal. No CVA tenderness. Resp: clear to auscultation bilaterally Chest wall: no tenderness Cardio: regular rate and rhythm, S1, S2 normal, no murmur, click, rub or gallop GI: soft, non-tender; bowel sounds normal; no masses,  no organomegaly Extremities: extremities normal, atraumatic, no cyanosis or edema Pulses: 2+ and symmetric Skin: Skin color, texture, turgor normal. No rashes or lesions Neurologic: Grossly normal  Lab Results:  Recent Labs  08/16/12 0600 08/17/12 0505  WBC 19.1* 20.2*  HGB 7.1* 7.2*  HCT 20.7* 20.7*  PLT 476* 514*   BMET  Recent Labs  08/17/12 0505 08/18/12 0500  NA 137 134*  K 3.5 3.9  CL 107 102  CO2 23 22  GLUCOSE 116* 109*  BUN 4* 5*  CREATININE 0.57 0.57  CALCIUM 8.6 8.5    Studies/Results: Dg Chest 2 View  08/18/2012   *RADIOLOGY REPORT*  Clinical Data: Cough.  Sickle cell patient.  CHEST - 2 VIEW  Comparison:  08/13/2012  Findings: There is a left chest wall porta-catheter with tip in the SVC.  There is mild cardiac enlargement.  No pleural effusion or edema identified.  No airspace consolidation identified.  Bony stigmata of sickle cell disease noted.  IMPRESSION:  1. Cardiac enlargement. 2.  No acute cardiopulmonary abnormalities   Original Report Authenticated By: Signa Kell, M.D.    Medications: I have reviewed the patient's current medications.  Assessment/Plan: A 32 year old gentleman admitted with sickle cell painful crisis.  #1 sickle cell painful crisis: Continue his pain control at this point. We decreased the Dilaudid dose to 2 mg every 2 hours.  #2 sickle cell anemia: His H&H seems to be stable. We'll continue to monitor.   #3 leukocytosis: Most likely secondary to sickle cell crisis. The white count is stable.  #4 history of pulmonary embolism: He is on Xarelto. Continue.  #5 mood disorder: Continue olanzapine.  LOS: 3 days   [name],[name] 08/18/2012, 1:53 PM

## 2012-08-19 DIAGNOSIS — M545 Low back pain: Secondary | ICD-10-CM | POA: Diagnosis not present

## 2012-08-19 DIAGNOSIS — I2782 Chronic pulmonary embolism: Secondary | ICD-10-CM | POA: Diagnosis not present

## 2012-08-19 DIAGNOSIS — R52 Pain, unspecified: Secondary | ICD-10-CM | POA: Diagnosis not present

## 2012-08-19 DIAGNOSIS — F39 Unspecified mood [affective] disorder: Secondary | ICD-10-CM | POA: Diagnosis not present

## 2012-08-19 DIAGNOSIS — D5700 Hb-SS disease with crisis, unspecified: Secondary | ICD-10-CM | POA: Diagnosis not present

## 2012-08-19 DIAGNOSIS — D72829 Elevated white blood cell count, unspecified: Secondary | ICD-10-CM | POA: Diagnosis not present

## 2012-08-19 LAB — COMPREHENSIVE METABOLIC PANEL
ALT: 13 U/L (ref 0–53)
AST: 25 U/L (ref 0–37)
Albumin: 3.3 g/dL — ABNORMAL LOW (ref 3.5–5.2)
CO2: 25 mEq/L (ref 19–32)
Calcium: 8.8 mg/dL (ref 8.4–10.5)
Sodium: 134 mEq/L — ABNORMAL LOW (ref 135–145)
Total Protein: 6.6 g/dL (ref 6.0–8.3)

## 2012-08-19 LAB — CBC WITH DIFFERENTIAL/PLATELET
Basophils Relative: 1 % (ref 0–1)
Eosinophils Absolute: 2.6 10*3/uL — ABNORMAL HIGH (ref 0.0–0.7)
HCT: 20.3 % — ABNORMAL LOW (ref 39.0–52.0)
Hemoglobin: 7 g/dL — ABNORMAL LOW (ref 13.0–17.0)
MCH: 31.5 pg (ref 26.0–34.0)
MCHC: 34.5 g/dL (ref 30.0–36.0)
MCV: 91.4 fL (ref 78.0–100.0)
Monocytes Absolute: 1.8 10*3/uL — ABNORMAL HIGH (ref 0.1–1.0)
Monocytes Relative: 11 % (ref 3–12)

## 2012-08-19 NOTE — Discharge Summary (Signed)
Physician Discharge Summary  Patient ID: [name] MRN: [id number] DOB/AGE: [birth year] 32 y.o.  Admit date: 08/15/2012 Discharge date: 08/19/2012  Admission Diagnoses:  Discharge Diagnoses:  Active Problems:   * No active hospital problems. *   Discharged Condition: good  Hospital Course: A 32 year old gentleman admitted with sickle cell painful crisis. He was initially on Dilaudid PCA pump with hydration Toradol. H&H was followed closely. He received one unit of packed red blood cells along the line. His hemoglobin now is 7.0 and stable. His pain control is now at 3/10 and he is very functional. We will therefore discharge him home to follow with Dr. Ashley Royalty on July 28. He has his home medications refilled on June 28. He has been instructed to take his medications as prescribed.  Consults: None  Significant Diagnostic Studies: labs: Mainly CBC and CMP. They showed mild hemolytic anemia which has since resolved.  Treatments: IV hydration, analgesia: Dilaudid and blood transfusion.  Discharge Exam: Blood pressure 130/74, pulse 73, temperature 98.2 F (36.8 C), temperature source Oral, resp. rate 18, height 6' (1.829 m), weight 78.881 kg (173 lb 14.4 oz), SpO2 100.00%. General appearance: alert, cooperative and no distress Eyes: conjunctivae/corneas clear. PERRL, EOM's intact. Fundi benign. Neck: no adenopathy, no carotid bruit, no JVD, supple, symmetrical, trachea midline and thyroid not enlarged, symmetric, no tenderness/mass/nodules Back: symmetric, no curvature. ROM normal. No CVA tenderness. Resp: clear to auscultation bilaterally Chest wall: no tenderness Cardio: regular rate and rhythm, S1, S2 normal, no murmur, click, rub or gallop GI: soft, non-tender; bowel sounds normal; no masses,  no organomegaly Extremities: extremities normal, atraumatic, no cyanosis or edema Pulses: 2+ and symmetric Skin: Skin color, texture, turgor normal. No rashes or lesions Neurologic:  Grossly normal  Disposition: 01-Home or Self Care   Future Appointments Provider Department Dept Phone   08/27/2012 2:30 PM Altha Harm, MD [facility] [phone]       Medication List         celecoxib 200 MG capsule  Commonly known as:  CELEBREX  Take 1 capsule (200 mg total) by mouth 2 (two) times daily as needed for pain.     folic acid 1 MG tablet  Commonly known as:  FOLVITE  Take 1 tablet (1 mg total) by mouth every morning.     gabapentin 300 MG capsule  Commonly known as:  NEURONTIN  Take 1 capsule (300 mg total) by mouth daily.     HYDROmorphone 4 MG tablet  Commonly known as:  DILAUDID  Take 1 tablet (4 mg total) by mouth every 4 (four) hours as needed for pain.     hydroxyurea 500 MG capsule  Commonly known as:  HYDREA  Take 2 capsules (1,000 mg total) by mouth daily. May take with food to minimize GI side effects.     morphine 15 MG 12 hr tablet  Commonly known as:  MS CONTIN  Take 1 tablet (15 mg total) by mouth 2 (two) times daily.     naloxone 1 MG/ML injection  Commonly known as:  NARCAN  - Route: Intrnasal  - Naloxone 2 mg/ml pre-filled syringe with naloxone kit.   - Disp: #4 ml (2 Syringes)  - Sig: For suspected opioid overdose, spray 1 ml in each nostril as directed. Repeat after 3 minutes if no or minimal response     potassium chloride SA 20 MEQ tablet  Commonly known as:  K-DUR,KLOR-CON  Take 1 tablet (20 mEq total) by mouth  every morning.     XARELTO 20 MG Tabs  Generic drug:  Rivaroxaban  Take 20 mg by mouth daily.     zolpidem 10 MG tablet  Commonly known as:  AMBIEN  Take 1 tablet (10 mg total) by mouth at bedtime as needed for sleep. For sleep         Signed: [name],[name] 08/19/2012, 12:51 PM

## 2012-08-21 IMAGING — US IR FLUORO GUIDE CV LINE*R*
1 series · 1 of 1 positions shown · non-contrast
Comparison: none

CLINICAL DATA: Sickle cell crisis

PICC PLACEMENT WITH ULTRASOUND AND FLUOROSCOPY
TECHNIQUE: After written informed consent was obtained, patient was
placed in the supine position on angiographic table. Patency of the
right basilic vein was confirmed with ultrasound with image
documentation. An appropriate skin site was determined. Skin site
was marked. Region was prepped using maximum barrier technique
including cap and mask, sterile gown, sterile gloves, large sterile
sheet, and Chlorhexidine   as cutaneous antisepsis.  The region was
infiltrated locally with 1% lidocaine.   Under real-time ultrasound
guidance, the right basilic vein was accessed with a 21 gauge
micropuncture needle; the needle tip within the vein was confirmed
with ultrasound image documentation.   Needle exchanged over a 018
guidewire for a peel-away sheath, through which a 5-French double-
lumen power injectable PICC trimmed to 38cm was advanced,
positioned with its tip near the cavoatrial junction.  Spot chest
radiograph confirms appropriate catheter position.  Catheter was
flushed per protocol and secured externally with 0-Prolene sutures.
The patient tolerated procedure well, with no immediate
complication.

[Series 1: sp us guide vasc access*right* · 1 of 1 slices shown]
[im 1/1]
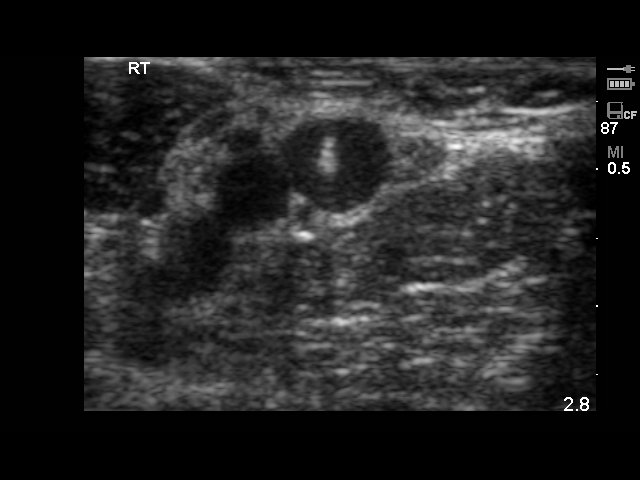

[1 of 1 positions shown; findings below may reference images not displayed]

IMPRESSION: Technically successful five French double lumen power injectable
PICC placement

## 2012-08-23 NOTE — H&P (Signed)
Patient seen and examined. Agree with above notes by the NP. Will continue patients management in the hospital

## 2012-08-23 NOTE — Discharge Summary (Signed)
Agree with notes above. Seen and examined. Discussed case with [name], NP

## 2012-08-24 ENCOUNTER — Emergency Department (HOSPITAL_COMMUNITY)
Admission: EM | Admit: 2012-08-24 | Discharge: 2012-08-24 | Disposition: A | Discharge status: Home / Self Care | Payer: Medicare Other | Source: Home / Self Care | Attending: Emergency Medicine | Admitting: Emergency Medicine

## 2012-08-24 ENCOUNTER — Encounter (HOSPITAL_COMMUNITY): Payer: Self-pay | Admitting: Emergency Medicine

## 2012-08-24 DIAGNOSIS — I2699 Other pulmonary embolism without acute cor pulmonale: Principal | ICD-10-CM | POA: Diagnosis present

## 2012-08-24 DIAGNOSIS — Z86711 Personal history of pulmonary embolism: Secondary | ICD-10-CM | POA: Diagnosis not present

## 2012-08-24 DIAGNOSIS — Z8659 Personal history of other mental and behavioral disorders: Secondary | ICD-10-CM | POA: Insufficient documentation

## 2012-08-24 DIAGNOSIS — I369 Nonrheumatic tricuspid valve disorder, unspecified: Secondary | ICD-10-CM | POA: Diagnosis not present

## 2012-08-24 DIAGNOSIS — Z5189 Encounter for other specified aftercare: Secondary | ICD-10-CM | POA: Insufficient documentation

## 2012-08-24 DIAGNOSIS — Z79899 Other long term (current) drug therapy: Secondary | ICD-10-CM | POA: Insufficient documentation

## 2012-08-24 DIAGNOSIS — D72829 Elevated white blood cell count, unspecified: Secondary | ICD-10-CM | POA: Diagnosis not present

## 2012-08-24 DIAGNOSIS — D5700 Hb-SS disease with crisis, unspecified: Secondary | ICD-10-CM | POA: Diagnosis present

## 2012-08-24 DIAGNOSIS — Z7901 Long term (current) use of anticoagulants: Secondary | ICD-10-CM

## 2012-08-24 DIAGNOSIS — M549 Dorsalgia, unspecified: Secondary | ICD-10-CM | POA: Insufficient documentation

## 2012-08-24 DIAGNOSIS — J189 Pneumonia, unspecified organism: Secondary | ICD-10-CM | POA: Diagnosis present

## 2012-08-24 DIAGNOSIS — I10 Essential (primary) hypertension: Secondary | ICD-10-CM | POA: Diagnosis present

## 2012-08-24 DIAGNOSIS — I82C19 Acute embolism and thrombosis of unspecified internal jugular vein: Secondary | ICD-10-CM | POA: Insufficient documentation

## 2012-08-24 DIAGNOSIS — R17 Unspecified jaundice: Secondary | ICD-10-CM | POA: Insufficient documentation

## 2012-08-24 DIAGNOSIS — R0602 Shortness of breath: Secondary | ICD-10-CM | POA: Diagnosis not present

## 2012-08-24 DIAGNOSIS — Z8619 Personal history of other infectious and parasitic diseases: Secondary | ICD-10-CM | POA: Insufficient documentation

## 2012-08-24 DIAGNOSIS — Z9119 Patient's noncompliance with other medical treatment and regimen: Secondary | ICD-10-CM | POA: Diagnosis not present

## 2012-08-24 DIAGNOSIS — R0902 Hypoxemia: Secondary | ICD-10-CM | POA: Diagnosis not present

## 2012-08-24 DIAGNOSIS — D571 Sickle-cell disease without crisis: Secondary | ICD-10-CM | POA: Diagnosis not present

## 2012-08-24 DIAGNOSIS — Z862 Personal history of diseases of the blood and blood-forming organs and certain disorders involving the immune mechanism: Secondary | ICD-10-CM | POA: Insufficient documentation

## 2012-08-24 DIAGNOSIS — R071 Chest pain on breathing: Secondary | ICD-10-CM | POA: Insufficient documentation

## 2012-08-24 DIAGNOSIS — E876 Hypokalemia: Secondary | ICD-10-CM | POA: Insufficient documentation

## 2012-08-24 DIAGNOSIS — R1013 Epigastric pain: Secondary | ICD-10-CM | POA: Insufficient documentation

## 2012-08-24 DIAGNOSIS — Z8739 Personal history of other diseases of the musculoskeletal system and connective tissue: Secondary | ICD-10-CM | POA: Insufficient documentation

## 2012-08-24 DIAGNOSIS — Z91199 Patient's noncompliance with other medical treatment and regimen due to unspecified reason: Secondary | ICD-10-CM

## 2012-08-24 DIAGNOSIS — Z87891 Personal history of nicotine dependence: Secondary | ICD-10-CM | POA: Insufficient documentation

## 2012-08-24 LAB — COMPREHENSIVE METABOLIC PANEL
ALT: 18 U/L (ref 0–53)
AST: 32 U/L (ref 0–37)
Calcium: 9.1 mg/dL (ref 8.4–10.5)
Creatinine, Ser: 0.6 mg/dL (ref 0.50–1.35)
GFR calc Af Amer: >90 mL/min (ref >90)
Glucose, Bld: 98 mg/dL (ref 70–99)
Sodium: 137 mEq/L (ref 135–145)
Total Protein: 7.6 g/dL (ref 6.0–8.3)

## 2012-08-24 LAB — CBC
MCH: 31.3 pg (ref 26.0–34.0)
MCHC: 34.1 g/dL (ref 30.0–36.0)
MCV: 91.8 fL (ref 78.0–100.0)
Platelets: 661 10*3/uL — ABNORMAL HIGH (ref 150–400)

## 2012-08-24 LAB — RETICULOCYTES
RBC.: 2.43 MIL/uL — ABNORMAL LOW (ref 4.22–5.81)
Retic Count, Absolute: 381.5 10*3/uL — ABNORMAL HIGH (ref 19.0–186.0)

## 2012-08-24 LAB — BILIRUBIN, DIRECT: Bilirubin, Direct: 0.5 mg/dL — ABNORMAL HIGH (ref 0.0–0.3)

## 2012-08-24 MED ORDER — HYDROMORPHONE HCL PF 2 MG/ML IJ SOLN
2.0000 mg | Freq: Once | INTRAMUSCULAR | Status: AC
  Administered 2012-08-24: 2 mg via INTRAVENOUS
  Filled 2012-08-24: qty 1

## 2012-08-24 MED ORDER — SODIUM CHLORIDE 0.9 % IV BOLUS (SEPSIS)
1000.0000 mL | Freq: Once | INTRAVENOUS | Status: AC
  Administered 2012-08-24: 1000 mL via INTRAVENOUS

## 2012-08-24 MED ORDER — DIPHENHYDRAMINE HCL 50 MG/ML IJ SOLN
25.0000 mg | Freq: Once | INTRAMUSCULAR | Status: AC
  Administered 2012-08-24: 25 mg via INTRAVENOUS
  Filled 2012-08-24: qty 1

## 2012-08-24 MED ORDER — DIPHENHYDRAMINE HCL 50 MG/ML IJ SOLN: 25.0000 mg | Freq: Once | INTRAMUSCULAR | Status: DC

## 2012-08-24 NOTE — ED Provider Notes (Signed)
CSN: [id number]     Arrival date & time 08/24/12  7829 History     First MD Initiated Contact with Patient 08/24/12 [phone]     No chief complaint on file.  (Consider location/radiation/quality/duration/timing/severity/associated sxs/prior Treatment) HPI Comments: 32 y/o male with a PMHx of sickle cell anemia, PE, HTN and avascular necrosis presents to the ED complaining of sickle cell pain in his ribs and back x 2 days. States this pain is his typical sickle cell pain, constant, rated 9/10 described as throbbing. Tried taking his home narcotic medication without relief. Denies fever, chills, chest pain, sob, nausea, vomiting, recent illness. Due to see Dr. Ashley Royalty at the [facility] clinic on 7/28.   The history is provided by the patient.    Past Medical History  Diagnosis Date  . Sickle cell anemia   . Blood transfusion   . 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 Hypokalemia   . Mood disorder   . Pulmonary embolism   . Avascular necrosis   . Leukocytosis     Chronic  . Thrombocytosis     Chronic  . Hypertension    Past Surgical History  Procedure Laterality Date  . Right hip replacement      08/2006  . Cholecystectomy      01/2008  . Porta cath placement    . Porta cath removal    . Umbilical hernia repair      01/2008  . Excision of left periauricular cyst      10/2009  . Excision of right ear lobe cyst with primary closur      11/2007  . Portacath placement  01/05/2012    Procedure: INSERTION PORT-A-CATH;  Surgeon: Adolph Pollack, MD;  Location: [facility] OR;  Service: General;  Laterality: N/A;  ultrasound guiced port a cath insertion with fluoroscopy   Family History  Problem Relation Age of Onset  . Sickle cell anemia Mother   . Sickle cell anemia Father   . Sickle cell trait Brother    History  Substance Use Topics  . Smoking status: Former Smoker -- 13 years    Types: Cigarettes    Quit date: 07/08/2010  . Smokeless tobacco: Never  Used  . Alcohol Use: No    Review of Systems  Constitutional: Negative for fever and chills.  Gastrointestinal: Negative for nausea and vomiting.  Musculoskeletal: Positive for back pain.       Positive for rib pain.  All other systems reviewed and are negative.    Allergies  Morphine and related  Home Medications   Current Outpatient Rx  Name  Route  Sig  Dispense  Refill  . celecoxib (CELEBREX) 200 MG capsule   Oral   Take 1 capsule (200 mg total) by mouth 2 (two) times daily as needed for pain.   30 capsule   0   . folic acid (FOLVITE) 1 MG tablet   Oral   Take 1 tablet (1 mg total) by mouth every morning.   30 tablet   11   . gabapentin (NEURONTIN) 300 MG capsule   Oral   Take 1 capsule (300 mg total) by mouth daily.   30 capsule   2   . HYDROmorphone (DILAUDID) 4 MG tablet   Oral   Take 1 tablet (4 mg total) by mouth every 4 (four) hours as needed for pain.   90 tablet   0   . hydroxyurea (HYDREA) 500 MG capsule  Oral   Take 2 capsules (1,000 mg total) by mouth daily. May take with food to minimize GI side effects.   60 capsule   1   . morphine (MS CONTIN) 15 MG 12 hr tablet   Oral   Take 1 tablet (15 mg total) by mouth 2 (two) times daily.   60 tablet   0   . naloxone (NARCAN) 1 MG/ML injection      Route: Intrnasal Naloxone 2 mg/ml pre-filled syringe with naloxone kit.  Disp: #4 ml (2 Syringes) Sig: For suspected opioid overdose, spray 1 ml in each nostril as directed. Repeat after 3 minutes if no or minimal response   4 mL   1   . potassium chloride SA (K-DUR,KLOR-CON) 20 MEQ tablet   Oral   Take 1 tablet (20 mEq total) by mouth every morning.   30 tablet   3   . Rivaroxaban (XARELTO) 20 MG TABS   Oral   Take 20 mg by mouth daily.         Marland Kitchen zolpidem (AMBIEN) 10 MG tablet   Oral   Take 1 tablet (10 mg total) by mouth at bedtime as needed for sleep. For sleep   30 tablet   1    There were no vitals taken for this  visit. Physical Exam  Nursing note and vitals reviewed. Constitutional: He is oriented to person, place, and time. He appears well-developed and well-nourished. No distress.  HENT:  Head: Normocephalic and atraumatic.  Mouth/Throat: Oropharynx is clear and moist.  Eyes: Conjunctivae and EOM are normal. Pupils are equal, round, and reactive to light. Scleral icterus is present.  Neck: Normal range of motion. Neck supple.  Cardiovascular: Normal rate, regular rhythm and normal heart sounds.   Pulmonary/Chest: Effort normal and breath sounds normal.  Chest wall non-tender, has "throbbing" sensation with palpation.  Abdominal: Soft. Bowel sounds are normal. He exhibits no distension. There is tenderness (mild) in the epigastric area. There is no rigidity, no rebound and no guarding.  Musculoskeletal: Normal range of motion. He exhibits no edema.  Lumbar, thoracic and c-spine non-tender, has throbbing sensation with palpation.  Neurological: He is alert and oriented to person, place, and time.  Skin: Skin is warm and dry. He is not diaphoretic.  Psychiatric: He has a normal mood and affect. His behavior is normal.    ED Course   Procedures (including critical care time)  Labs Reviewed  CBC - Abnormal; Notable for the following:    WBC 20.9 (*)    RBC 2.43 (*)    Hemoglobin 7.6 (*)    HCT 22.3 (*)    RDW 21.4 (*)    Platelets 661 (*)    All other components within normal limits  COMPREHENSIVE METABOLIC PANEL - Abnormal; Notable for the following:    Total Bilirubin 8.7 (*)    All other components within normal limits  RETICULOCYTES - Abnormal; Notable for the following:    Retic Ct Pct 15.7 (*)    RBC. 2.43 (*)    Retic Count, Manual 381.5 (*)    All other components within normal limits  BILIRUBIN, DIRECT - Abnormal; Notable for the following:    Bilirubin, Direct 0.5 (*)    All other components within normal limits   No results found. 1. Sickle cell crisis     MDM   Patient with his "typical" sickle-cell pain. He appears in NAD, normal vital signs. No chest pain. Cbc, cmp, reticulocytes. Dilaudid, benadryl, fluid  bolus. 10:56 AM No improvement of symptoms after initial 2mg  of dilaudid. Will give another 2mg  and re-assess. 12:04 PM Pain improved to 6/10. Will give another dose of dilaudid, re-assess, plan to discharge home. Regarding bilirubin of 8.7, direct 0.5, result most likely from hemolysis. Labs otherwise near baseline. 12:51 PM Pain improved to 4/10, feels well enough to go home. He is stable for discharge. Return precautions discussed. He will f/u with Dr. Ashley Royalty. Patient states understanding of plan and is agreeable.   Trevor Mace, PA-C 08/24/12 1251

## 2012-08-24 NOTE — ED Notes (Signed)
Pt reports sickle cell crisis since yesterday. Pain in back. Pain normal location for crisis.

## 2012-08-24 NOTE — ED Notes (Signed)
PA Robyn at bedside  

## 2012-08-27 ENCOUNTER — Encounter: Payer: Self-pay | Admitting: Internal Medicine

## 2012-08-27 ENCOUNTER — Ambulatory Visit (INDEPENDENT_AMBULATORY_CARE_PROVIDER_SITE_OTHER): Payer: Medicare Other | Admitting: Internal Medicine

## 2012-08-27 ENCOUNTER — Encounter (HOSPITAL_COMMUNITY): Payer: Self-pay | Admitting: *Deleted

## 2012-08-27 ENCOUNTER — Inpatient Hospital Stay (HOSPITAL_COMMUNITY)
Admit: 2012-08-27 | Discharge: 2012-09-02 | DRG: 175 | Disposition: A | Discharge status: Home / Self Care | Payer: Medicare Other | Source: Ambulatory Visit | Attending: Internal Medicine | Admitting: Internal Medicine

## 2012-08-27 ENCOUNTER — Encounter (HOSPITAL_COMMUNITY): Payer: Self-pay

## 2012-08-27 ENCOUNTER — Other Ambulatory Visit: Payer: Self-pay | Admitting: Internal Medicine

## 2012-08-27 ENCOUNTER — Ambulatory Visit (HOSPITAL_COMMUNITY)
Admission: RE | Admit: 2012-08-27 | Discharge: 2012-08-27 | Disposition: A | Discharge status: Home / Self Care | Payer: Medicare Other | Source: Ambulatory Visit | Attending: Internal Medicine | Admitting: Internal Medicine

## 2012-08-27 VITALS — BP 115/64 | HR 74 | Temp 98.5°F | Wt 171.0 lb

## 2012-08-27 DIAGNOSIS — D571 Sickle-cell disease without crisis: Secondary | ICD-10-CM

## 2012-08-27 DIAGNOSIS — J189 Pneumonia, unspecified organism: Secondary | ICD-10-CM

## 2012-08-27 DIAGNOSIS — R0609 Other forms of dyspnea: Secondary | ICD-10-CM

## 2012-08-27 DIAGNOSIS — I2699 Other pulmonary embolism without acute cor pulmonale: Secondary | ICD-10-CM

## 2012-08-27 DIAGNOSIS — R0902 Hypoxemia: Secondary | ICD-10-CM

## 2012-08-27 DIAGNOSIS — R0602 Shortness of breath: Secondary | ICD-10-CM

## 2012-08-27 DIAGNOSIS — Z7901 Long term (current) use of anticoagulants: Secondary | ICD-10-CM

## 2012-08-27 DIAGNOSIS — D72829 Elevated white blood cell count, unspecified: Secondary | ICD-10-CM

## 2012-08-27 DIAGNOSIS — R918 Other nonspecific abnormal finding of lung field: Secondary | ICD-10-CM | POA: Insufficient documentation

## 2012-08-27 DIAGNOSIS — R197 Diarrhea, unspecified: Secondary | ICD-10-CM

## 2012-08-27 DIAGNOSIS — D5700 Hb-SS disease with crisis, unspecified: Secondary | ICD-10-CM

## 2012-08-27 DIAGNOSIS — I517 Cardiomegaly: Secondary | ICD-10-CM | POA: Insufficient documentation

## 2012-08-27 LAB — COMPREHENSIVE METABOLIC PANEL
ALT: 13 U/L (ref 0–53)
AST: 21 U/L (ref 0–37)
Albumin: 4 g/dL (ref 3.5–5.2)
Alkaline Phosphatase: 66 U/L (ref 39–117)
BUN: 5 mg/dL — ABNORMAL LOW (ref 6–23)
CO2: 22 mEq/L (ref 19–32)
Calcium: 9.3 mg/dL (ref 8.4–10.5)
Chloride: 106 mEq/L (ref 96–112)
Creatinine, Ser: 0.55 mg/dL (ref 0.50–1.35)
GFR calc Af Amer: >90 mL/min (ref >90)
GFR calc non Af Amer: >90 mL/min (ref >90)
Glucose, Bld: 101 mg/dL — ABNORMAL HIGH (ref 70–99)
Potassium: 3.2 mEq/L — ABNORMAL LOW (ref 3.5–5.1)
Sodium: 138 mEq/L (ref 135–145)
Total Bilirubin: 5.8 mg/dL — ABNORMAL HIGH (ref 0.3–1.2)
Total Protein: 7.3 g/dL (ref 6.0–8.3)

## 2012-08-27 LAB — LACTATE DEHYDROGENASE: LDH: 421 U/L — ABNORMAL HIGH (ref 94–250)

## 2012-08-27 LAB — CBC WITH DIFFERENTIAL/PLATELET
Hemoglobin: 7.5 g/dL — ABNORMAL LOW (ref 13.0–17.0)
Lymphocytes Relative: 13 % (ref 12–46)
Lymphs Abs: 2.9 10*3/uL (ref 0.7–4.0)
Neutro Abs: 15.8 10*3/uL — ABNORMAL HIGH (ref 1.7–7.7)
Neutrophils Relative %: 70 % (ref 43–77)
Platelets: 547 10*3/uL — ABNORMAL HIGH (ref 150–400)
RBC: 2.38 MIL/uL — ABNORMAL LOW (ref 4.22–5.81)
WBC: 22.6 10*3/uL — ABNORMAL HIGH (ref 4.0–10.5)

## 2012-08-27 LAB — RETICULOCYTES: Retic Ct Pct: 17.8 % — ABNORMAL HIGH (ref 0.4–3.1)

## 2012-08-27 MED ORDER — IOHEXOL 350 MG/ML SOLN
100.0000 mL | Freq: Once | INTRAVENOUS | Status: AC | PRN
  Administered 2012-08-27: 100 mL via INTRAVENOUS

## 2012-08-27 MED ORDER — POTASSIUM CHLORIDE CRYS ER 20 MEQ PO TBCR
20.0000 meq | EXTENDED_RELEASE_TABLET | Freq: Every morning | ORAL | Status: DC
  Administered 2012-08-28 – 2012-09-02 (×6): 20 meq via ORAL
  Filled 2012-08-27 (×6): qty 1

## 2012-08-27 MED ORDER — ENOXAPARIN SODIUM 80 MG/0.8ML ~~LOC~~ SOLN: 1.0000 mg/kg | Freq: Two times a day (BID) | SUBCUTANEOUS | Status: DC

## 2012-08-27 MED ORDER — ONDANSETRON HCL 4 MG PO TABS: 4.0000 mg | ORAL_TABLET | Freq: Four times a day (QID) | ORAL | Status: DC | PRN

## 2012-08-27 MED ORDER — VANCOMYCIN HCL IN DEXTROSE 1-5 GM/200ML-% IV SOLN
1000.0000 mg | Freq: Three times a day (TID) | INTRAVENOUS | Status: DC
  Administered 2012-08-27 – 2012-08-28 (×3): 1000 mg via INTRAVENOUS
  Filled 2012-08-27 (×4): qty 200

## 2012-08-27 MED ORDER — FOLIC ACID 1 MG PO TABS
1.0000 mg | ORAL_TABLET | Freq: Every day | ORAL | Status: DC
  Administered 2012-08-27 – 2012-09-02 (×7): 1 mg via ORAL
  Filled 2012-08-27 (×8): qty 1

## 2012-08-27 MED ORDER — HYDROMORPHONE HCL 4 MG PO TABS: 4.0000 mg | ORAL_TABLET | ORAL | Status: DC | PRN

## 2012-08-27 MED ORDER — KETOROLAC TROMETHAMINE 30 MG/ML IJ SOLN: 30.0000 mg | Freq: Four times a day (QID) | INTRAMUSCULAR | Status: AC | PRN

## 2012-08-27 MED ORDER — MORPHINE SULFATE ER 15 MG PO TBCR: 15.0000 mg | EXTENDED_RELEASE_TABLET | Freq: Two times a day (BID) | ORAL | Status: DC

## 2012-08-27 MED ORDER — FOLIC ACID 1 MG PO TABS: 1.0000 mg | ORAL_TABLET | Freq: Every morning | ORAL | Status: DC

## 2012-08-27 MED ORDER — SENNOSIDES-DOCUSATE SODIUM 8.6-50 MG PO TABS
1.0000 | ORAL_TABLET | Freq: Two times a day (BID) | ORAL | Status: DC
  Administered 2012-08-27 – 2012-09-02 (×12): 1 via ORAL
  Filled 2012-08-27 (×13): qty 1

## 2012-08-27 MED ORDER — HYDROMORPHONE HCL PF 1 MG/ML IJ SOLN
1.0000 mg | INTRAMUSCULAR | Status: DC | PRN
  Administered 2012-08-27 – 2012-08-28 (×5): 1 mg via INTRAVENOUS
  Filled 2012-08-27 (×5): qty 1

## 2012-08-27 MED ORDER — MORPHINE SULFATE ER 15 MG PO TBCR
15.0000 mg | EXTENDED_RELEASE_TABLET | Freq: Two times a day (BID) | ORAL | Status: DC
  Administered 2012-08-27 – 2012-09-02 (×12): 15 mg via ORAL
  Filled 2012-08-27 (×12): qty 1

## 2012-08-27 MED ORDER — RIVAROXABAN 20 MG PO TABS
20.0000 mg | ORAL_TABLET | Freq: Every day | ORAL | Status: DC
  Filled 2012-08-27 (×2): qty 1

## 2012-08-27 MED ORDER — DEXTROSE 5 % IV SOLN
1.0000 g | Freq: Three times a day (TID) | INTRAVENOUS | Status: DC
  Administered 2012-08-27 – 2012-09-02 (×18): 1 g via INTRAVENOUS
  Filled 2012-08-27 (×19): qty 1

## 2012-08-27 MED ORDER — ONDANSETRON HCL 4 MG/2ML IJ SOLN: 4.0000 mg | Freq: Four times a day (QID) | INTRAMUSCULAR | Status: DC | PRN

## 2012-08-27 MED ORDER — ZOLPIDEM TARTRATE 10 MG PO TABS: 10.0000 mg | ORAL_TABLET | Freq: Every evening | ORAL | Status: DC | PRN

## 2012-08-27 MED ORDER — CVS ACIDOPHILUS PROBIOTIC PO TABS: 1.0000 | ORAL_TABLET | Freq: Two times a day (BID) | ORAL | Status: DC

## 2012-08-27 MED ORDER — DEXTROSE-NACL 5-0.45 % IV SOLN
INTRAVENOUS | Status: DC
  Administered 2012-08-27 – 2012-09-01 (×9): via INTRAVENOUS

## 2012-08-27 MED ORDER — DIPHENHYDRAMINE HCL 25 MG PO CAPS
25.0000 mg | ORAL_CAPSULE | Freq: Four times a day (QID) | ORAL | Status: DC | PRN
  Administered 2012-08-28 – 2012-08-31 (×2): 50 mg via ORAL
  Filled 2012-08-27 (×2): qty 2

## 2012-08-27 MED ORDER — CELECOXIB 200 MG PO CAPS
200.0000 mg | ORAL_CAPSULE | Freq: Two times a day (BID) | ORAL | Status: DC | PRN
  Filled 2012-08-27: qty 1

## 2012-08-27 NOTE — H&P (Signed)
Hospital Admission Note Date: 08/27/2012  Patient name: [name] Medical record number: [id number] Date of birth: 1980/06/10 Age: 32 y.o. Gender: male PCP: Bhavya Eschete A., MD  Attending physician: Altha Harm, MD  Chief Complaint:DIzziness and DOE x 2 weeks  History of Present Illness:Pt recently discharged from [facility] after treatment of acute Vaso-occlusive crisis and C-diff. He states that since leavign the pospital he has continued to have pain 7/10 in Left side of chest in the lateral and posterior regions. He sates that the pain is constant and unrelieved by current medications and has been present since prior to his hospitalization. He is unable to describe the quality of the pain but denies pleurisy. He has been taking his pain medication as prescribed. Pain has not limited his ADL function but has prevented him from being able to go to work.   He also c/o DOE and dizziness that has been occurring since he prior to discharge. He denies any fevers, but states that he has had a none productive cough.   Pt was found to have relative hypoxia on ambulation and was sent for a CT angiogram to R/O PE. It revealed a small PE on the right side and patchy infiltrate bilaterally.      Scheduled Meds: . ceFEPime (MAXIPIME) IV  1 g Intravenous Q8H  . folic acid  1 mg Oral Daily  . [START ON 08/28/2012] rivaroxaban  20 mg Oral Q breakfast  . senna-docusate  1 tablet Oral BID  . vancomycin  1,000 mg Intravenous Q8H   Continuous Infusions: . dextrose 5 % and 0.45% NaCl 75 mL/hr at 08/27/12 1830   PRN Meds:.diphenhydrAMINE, HYDROmorphone (DILAUDID) injection, ondansetron (ZOFRAN) IV, ondansetron Allergies: Morphine and related Past Medical History  Diagnosis Date  . Sickle cell anemia   . Blood transfusion   . 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 Hypokalemia   . Mood disorder   . Pulmonary embolism   . Avascular necrosis   .  Leukocytosis     Chronic  . Thrombocytosis     Chronic  . Hypertension   . C. difficile colitis    Past Surgical History  Procedure Laterality Date  . Right hip replacement      08/2006  . Cholecystectomy      01/2008  . Porta cath placement    . Porta cath removal    . Umbilical hernia repair      01/2008  . Excision of left periauricular cyst      10/2009  . Excision of right ear lobe cyst with primary closur      11/2007  . Portacath placement  01/05/2012    Procedure: INSERTION PORT-A-CATH;  Surgeon: Adolph Pollack, MD;  Location: [facility] OR;  Service: General;  Laterality: N/A;  ultrasound guiced port a cath insertion with fluoroscopy   Family History  Problem Relation Age of Onset  . Sickle cell anemia Mother   . Sickle cell anemia Father   . Sickle cell trait Brother    History   Social History  . Marital Status: Single    Spouse Name: N/A    Number of Children: 0  . Years of Education: 13   Occupational History  . Unemployed    Social History Main Topics  . Smoking status: Former Smoker -- 13 years    Types: Cigarettes    Quit date: 07/08/2010  . Smokeless tobacco: Never Used  . Alcohol Use: No  .  Drug Use: No     Comment: quit smoking 2011  . Sexually Active: Yes -- Male partner(s)    Birth Control/ Protection: None   Other Topics Concern  . Not on file   Social History Narrative   Lives in an apartment.  Single.  Lives with girlfriend.  Does not use any assist devices.        [name]:  [phone] Mom, emergency contact   Review of Systems: A comprehensive review of systems was negative except for as noted in HPI.  Physical Exam: No intake or output data in the 24 hours ending 08/27/12 1911 General: Alert, awake, oriented x3, in no acute distress.  HEENT: [name]/AT PEERL, EOMI, anicteric Heart: Regular rate and rhythm, without murmurs, rubs, gallops, PMI non-displaced, no heaves or thrills on palpation.  Lungs: Diminished breath sounds  on left, no wheezing or rhonchi noted. No increased vocal fremitus resonant to percussion  Abdomen: Soft, nontender, nondistended, positive bowel sounds, no masses no hepatosplenomegaly noted.  Neuro: No focal neurological deficits noted  Musculoskeletal: No warm swelling or erythema around joints, no spinal tenderness noted.  Lab results: No results found for this basename: NA, K, CL, CO2, GLUCOSE, BUN, CREATININE, CALCIUM, MG, PHOS,  in the last 72 hours No results found for this basename: AST, ALT, ALKPHOS, BILITOT, PROT, ALBUMIN,  in the last 72 hours No results found for this basename: LIPASE, AMYLASE,  in the last 72 hours No results found for this basename: WBC, NEUTROABS, HGB, HCT, MCV, PLT,  in the last 72 hours No results found for this basename: CKTOTAL, CKMB, CKMBINDEX, TROPONINI,  in the last 72 hours No components found with this basename: POCBNP,  No results found for this basename: DDIMER,  in the last 72 hours No results found for this basename: HGBA1C,  in the last 72 hours No results found for this basename: CHOL, HDL, LDLCALC, TRIG, CHOLHDL, LDLDIRECT,  in the last 72 hours No results found for this basename: TSH, T4TOTAL, FREET3, T3FREE, THYROIDAB,  in the last 72 hours No results found for this basename: VITAMINB12, FOLATE, FERRITIN, TIBC, IRON, RETICCTPCT,  in the last 72 hours Imaging results:  Dg Chest 2 View  08/18/2012   *RADIOLOGY REPORT*  Clinical Data: Cough.  Sickle cell patient.  CHEST - 2 VIEW  Comparison: 08/13/2012  Findings: There is a left chest wall porta-catheter with tip in the SVC.  There is mild cardiac enlargement.  No pleural effusion or edema identified.  No airspace consolidation identified.  Bony stigmata of sickle cell disease noted.  IMPRESSION:  1. Cardiac enlargement. 2.  No acute cardiopulmonary abnormalities   Original Report Authenticated By: Signa Kell, M.D.   Dg Chest 2 View  08/13/2012   *RADIOLOGY REPORT*  Clinical Data: Left-sided  chest pain.  CHEST - 2 VIEW  Comparison: 07/09/2012 CT.  Findings: Left subclavian power port is present with the position unchanged compared to prior.  The tip is in the superior vena cava just superior to the cavoatrial junction.  Cardiopericardial silhouette remains mildly enlarged for projection.  The chronic pulmonary parenchymal scarring is present with interstitial prominence for age.  No airspace disease.  No focal consolidation or effusion.  Cholecystectomy.  IMPRESSION:  1.  Cardiomegaly eight and interstitial changes compatible with sickle cell anemia. 2.  No radiographic findings of acute chest syndrome.   Original Report Authenticated By: Andreas Newport, M.D.   Ct Angio Chest Pe W/cm &/or Wo Cm  08/27/2012   **ADDENDUM** CREATED:  08/27/2012 17:11:37  Comment:  I communicated this report directly by phone to Dr. Ashley Royalty at the [facility], August 27, 2012, 05:11 p.m.  **END ADDENDUM** SIGNED BY: Rutherford Guys. Margarita Grizzle, M.D.  08/27/2012   *RADIOLOGY REPORT*  Clinical Data: Shortness of breath  CT ANGIOGRAPHY CHEST  Technique:  Multidetector CT imaging of the chest using the standard protocol during bolus administration of intravenous contrast. Multiplanar reconstructed images including MIPs were obtained and reviewed to evaluate the vascular anatomy.  Contrast: OMNIPAQUE IOHEXOL 350 MG/ML SOLN  Comparison:  Chest radiograph August 18, 2012 and chest CT angiogram March 01, 2012  Findings: There is a pulmonary embolus in a peripheral branch of the superior   right lower lobe superior segment pulmonary artery. There is no thoracic aortic aneurysm or dissection.  There is patchy infiltrate in the anterior and lateral segments of the left lower lobe.  There is also patchy infiltrate in the lateral segment right lower lobe.  There is patchy bibasilar atelectasis.  There is also mild atelectatic change in the posterior segment right upper lobe.  There is mild scarring in the anterior segment left  upper lobe medially.  There is a small area of infiltrate in the anterior segment left upper lobe on slice 27.  There are multiple lymph nodes throughout the mediastinum, largest adjacent to the aortic arch.  The largest of these lymph nodes measures 1 x 0.9 cm.  By size criteria, there is no frank adenopathy.  Port-A-Cath tip is at the cavoatrial junction.  Heart is enlarged.  Pericardium is not thickened.  Visualized upper abdominal structures appear normal except for diffuse calcification in the spleen.  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  IMPRESSION: Small pulmonary embolus in a branch of the superior segment right lower lobe pulmonary artery.  No other pulmonary emboli identified.  Bony changes of sickle cell disease.  Splenic changes of sickle cell disease.  Areas of patchy infiltrate bilaterally.  Stable small mediastinal lymph nodes which are stable.   Original Report Authenticated By: Bretta Bang, M.D.   Other results:   Assessment/Plan: 1. Acute PE;. Will place patient on full dose Lovenox and discontinue Xarelto. Pt states that he has been compliant with Xarelto and yet has still had another PE. Will also ask Pulmonary to see patient and give recommendations.  2. Hospital Acquired Pneumonia: Pt with patchy infiltrates that could represent pneumonia versus atelectasis. Will start on antibiotics for HAP as patitn was recently hospitalized at [facility].  3. Hb SS: Pt presents with pain 7/10 subjectively. He appears comfortable and is functional with his pain. I will resume his home medication regimen and give him clinically assisted IV dilaudid for breakthrough pain. Will check CBC with diff and reticulocyte count as well as CMET and LDH. I renal function normal will add Toradol for pain.  4.Dizziness: Likely associated with Hypoxemia and PE. Will reassess in am.  5. HTN: Pt has a history of HTN but  is relatively hypotensive as he is without his medications and still with a lower than usual B/P. Orthostatics were checked in the clinic and patient had no significant change in B/P or HR. Will monitor.  Deovion Batrez A. 08/27/2012, 7:11 PM

## 2012-08-27 NOTE — ED Provider Notes (Signed)
Medical screening examination/treatment/procedure(s) were performed by non-physician practitioner and as supervising physician I was immediately available for consultation/collaboration.   Gwyneth Sprout, MD 08/27/12 [phone]

## 2012-08-27 NOTE — Progress Notes (Addendum)
  Subjective:    Patient ID: [name], male    DOB: 1980/10/01, 32 y.o.   MRN: [id number]  HPI: Pt recently discharged from [facility] after treatment of acute Vaso-occlusive crisis and C-diff. He states that since leavign the pospital he has continued to have pain 7/10 in Left side of chest in the lateral and posterior regions. He sates that the pain is constant and unrelieved by current medications and has been present since prior to his hospitalization. He is unable to describe the quality of the pain but denies pleurisy. He has been taking his pain medication as prescribed. Pain has not limited his  ADL function but has prevented him from being able to go to work.  He also c/o DOE and dizziness that has been occurring since he prior to discharge. He denies any fevers, but states that he has had a none productive cough.    Review of Systems  Constitutional: Positive for activity change.  HENT: Negative.   Eyes: Negative.   Respiratory: Positive for shortness of breath (DOE ).   Cardiovascular: Negative.   Gastrointestinal: Positive for diarrhea.  Endocrine: Negative.   Genitourinary: Negative.   Musculoskeletal: Negative for myalgias and arthralgias.       Chest wall pain  Skin: Negative.   Allergic/Immunologic: Negative.   Neurological: Negative.   Hematological: Negative.   Psychiatric/Behavioral: Negative.        Objective:   Physical Exam  Constitutional: He appears well-developed and well-nourished.  Eyes: Scleral icterus is present.  Cardiovascular: Normal rate and regular rhythm.   Pulmonary/Chest: Effort normal and breath sounds normal. He has no wheezes. He has no rales. He exhibits tenderness.  Abdominal: He exhibits no distension and no mass. There is no tenderness. There is no rebound and no guarding.  Musculoskeletal: Normal range of motion.          Assessment & Plan:  1. Hb SS: Pt was recently hospitatlized from 7/7-7/14 at [facility] for acute VOC. He states that since leavign the pospital he has continued to have pain 7/10 in Left side of chest in the lateral and posterior regions. He sates that the pain is constant and unrelieved by current medications and has been present since prior to his hospitalization. He is unable to describe the quality of the pain but denies pleurisy. He has been taking his pain medication as prescribed. Pain has not limited his  ADL function but has prevented him from being able to go to work. Will continue on current medications  2. Dizziness: Pt states that he has been having some dizziness associated with walking. He is not orthostatic but was somewhat relatively hypoxic with ambulation. Will check CT angiogram to R/O PE.   3. DOE: Pt reports some DOE and was found to have hypoxemia with ambulation. Will check CT angiogram.  4. Left sided lateral and posterior chest wall pain: See above. Continue current pain medications.  5. Diarrhea: Pt states that he was having some diarrhea for 2 days after leaving [facility] but has not had any since. He has not been eating well however.  Addendum:  Received call from Dr. Margarita Grizzle stating that CT angiogram shows small and patchy infiltrates in B/L lung fields most consistent with Pneumonia.

## 2012-08-27 NOTE — Progress Notes (Signed)
Pt does not want to sign up for My Chart. Briscoe Burns BSN, RN-BC Admissions RN  08/27/2012 6:20 PM

## 2012-08-27 NOTE — Addendum Note (Signed)
Addended by: Marthann Schiller A on: 08/27/2012 05:50 PM   Modules accepted: Orders

## 2012-08-27 NOTE — Progress Notes (Signed)
ANTIBIOTIC CONSULT NOTE - INITIAL  Pharmacy Consult for Vancomycin Indication: rule out pneumonia  Allergies  Allergen Reactions  . Morphine And Related Hives and Rash    Pt states he is able to tolerate Dilaudid with no reactions.    Patient Measurements: Height: 6' (182.9 cm) Weight: 171 lb (77.565 kg) IBW/kg (Calculated) : 77.6 Adjusted Body Weight:   Vital Signs: Temp: 98.4 F (36.9 C) (07/28 1808) Temp src: Oral (07/28 1808) BP: 116/76 mmHg (07/28 1808) Pulse Rate: 72 (07/28 1808) Intake/Output from previous day:   Intake/Output from this shift:    Labs: No results found for this basename: WBC, HGB, PLT, LABCREA, CREATININE,  in the last 72 hours Estimated Creatinine Clearance: 144.2 ml/min (by C-G formula based on Cr of 0.6). No results found for this basename: VANCOTROUGH, VANCOPEAK, VANCORANDOM, GENTTROUGH, GENTPEAK, GENTRANDOM, TOBRATROUGH, TOBRAPEAK, TOBRARND, AMIKACINPEAK, AMIKACINTROU, AMIKACIN,  in the last 72 hours   Microbiology: No results found for this or any previous visit (from the past 720 hour(s)).  Medical History: Past Medical History  Diagnosis Date  . Sickle cell anemia   . Blood transfusion   . 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 Hypokalemia   . Mood disorder   . Pulmonary embolism   . Avascular necrosis   . Leukocytosis     Chronic  . Thrombocytosis     Chronic  . Hypertension   . C. difficile colitis     Medications:  Scheduled:  . ceFEPime (MAXIPIME) IV  1 g Intravenous Q8H  . enoxaparin (LOVENOX) injection  1 mg/kg Subcutaneous Q12H  . folic acid  1 mg Oral Daily  . senna-docusate  1 tablet Oral BID   Infusions:  . dextrose 5 % and 0.45% NaCl     PRN: diphenhydrAMINE, HYDROmorphone (DILAUDID) injection, ondansetron (ZOFRAN) IV, ondansetron Assessment: 32 yo M admitted for suspected pneumonia.CT shows infiltrates in both lung fields consistent with pneumonia.  Goal of Therapy:  Vancomycin  trough level 15-20 mcg/ml  Plan:  Given patient's good renal clearance, will begin with Vancomycin 1000mg  IV q 8 hours x 8 days. Measure antibiotic drug levels at steady state Follow up culture results  Loletta Specter 08/27/2012,6:24 PM

## 2012-08-28 DIAGNOSIS — I2699 Other pulmonary embolism without acute cor pulmonale: Principal | ICD-10-CM

## 2012-08-28 DIAGNOSIS — D5700 Hb-SS disease with crisis, unspecified: Secondary | ICD-10-CM

## 2012-08-28 LAB — CBC WITH DIFFERENTIAL/PLATELET
Basophils Relative: 1 % (ref 0–1)
Eosinophils Absolute: 1.8 10*3/uL — ABNORMAL HIGH (ref 0.0–0.7)
Lymphs Abs: 3.6 10*3/uL (ref 0.7–4.0)
MCH: 31.8 pg (ref 26.0–34.0)
MCHC: 34.8 g/dL (ref 30.0–36.0)
Neutro Abs: 11.4 10*3/uL — ABNORMAL HIGH (ref 1.7–7.7)
Neutrophils Relative %: 58 % (ref 43–77)
Platelets: 465 10*3/uL — ABNORMAL HIGH (ref 150–400)
RBC: 2.2 MIL/uL — ABNORMAL LOW (ref 4.22–5.81)

## 2012-08-28 LAB — HIV ANTIBODY (ROUTINE TESTING W REFLEX): HIV: NONREACTIVE

## 2012-08-28 MED ORDER — HYDROXYUREA 500 MG PO CAPS
1000.0000 mg | ORAL_CAPSULE | Freq: Every day | ORAL | Status: DC
  Administered 2012-08-28 – 2012-09-02 (×6): 1000 mg via ORAL
  Filled 2012-08-28 (×6): qty 2

## 2012-08-28 MED ORDER — ENOXAPARIN SODIUM 80 MG/0.8ML ~~LOC~~ SOLN
80.0000 mg | Freq: Two times a day (BID) | SUBCUTANEOUS | Status: DC
  Administered 2012-08-28: 80 mg via SUBCUTANEOUS
  Filled 2012-08-28 (×3): qty 0.8

## 2012-08-28 MED ORDER — HYDROMORPHONE HCL PF 2 MG/ML IJ SOLN
3.0000 mg | INTRAMUSCULAR | Status: DC
  Filled 2012-08-28: qty 2

## 2012-08-28 MED ORDER — RIVAROXABAN 20 MG PO TABS
20.0000 mg | ORAL_TABLET | Freq: Every day | ORAL | Status: DC
  Administered 2012-08-28 – 2012-09-01 (×5): 20 mg via ORAL
  Filled 2012-08-28 (×6): qty 1

## 2012-08-28 MED ORDER — HYDROMORPHONE HCL PF 2 MG/ML IJ SOLN
3.0000 mg | INTRAMUSCULAR | Status: DC
  Administered 2012-08-28 – 2012-08-30 (×26): 3 mg via INTRAVENOUS
  Filled 2012-08-28 (×26): qty 2

## 2012-08-28 NOTE — Progress Notes (Signed)
Utilization review completed.  

## 2012-08-28 NOTE — Progress Notes (Signed)
ANTICOAGULATION CONSULT NOTE - Initial Consult  Pharmacy Consult for Lovenox Indication: pulmonary embolus  Allergies  Allergen Reactions  . Morphine And Related Hives and Rash    Pt states he can take the pill form, but not IV and he is able to tolerate Dilaudid with no reactions.   Patient Measurements: Height: 6' (182.9 cm) Weight: 171 lb (77.565 kg) IBW/kg (Calculated) : 77.6  Vital Signs: Temp: 98.2 F (36.8 C) (07/29 0623) Temp src: Oral (07/29 0623) BP: 104/73 mmHg (07/29 0623) Pulse Rate: 76 (07/29 0623)  Labs:  Recent Labs  08/27/12 1835 08/28/12 0540  HGB 7.5* 7.0*  HCT 21.8* 20.1*  PLT 547* 465*  CREATININE 0.55  --    Estimated Creatinine Clearance: 144.2 ml/min (by C-G formula based on Cr of 0.55).  Medical History: Past Medical History  Diagnosis Date  . Sickle cell anemia   . Blood transfusion   . 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 Hypokalemia   . Mood disorder   . Pulmonary embolism   . Avascular necrosis   . Leukocytosis     Chronic  . Thrombocytosis     Chronic  . Hypertension   . C. difficile colitis    Medications:  Scheduled:  . ceFEPime (MAXIPIME) IV  1 g Intravenous Q8H  . enoxaparin (LOVENOX) injection  80 mg Subcutaneous Q12H  . folic acid  1 mg Oral Daily  .  HYDROmorphone (DILAUDID) injection  3 mg Intravenous Q2H  . morphine  15 mg Oral BID  . potassium chloride SA  20 mEq Oral q morning - 10a  . senna-docusate  1 tablet Oral BID  . vancomycin  1,000 mg Intravenous Q8H   Assessment: 32 yoM with sickle cell disease; s/p recent admit @ [facility] for Vaso-occlusive crisis & CDiff; complaint of continued L chest pain. Chronic Xarelto for previous DVT; CT chest positive for PE. Patient states compliant with Xarelto.  Xarelto 20mg  daily; last dose stated 7/28  Begin Lovenox for PE  Goal of Therapy:  Anti Xa level 0.6-1.0 units/ml at peak 4 hr after Lovenox Monitor platelets by anticoagulation  protocol: Yes   Plan:  Lovenox 80mg  SQ q12 Monitor CBC  Otho Bellows PharmD Pager [phone] 08/28/2012, 8:03 AM

## 2012-08-28 NOTE — Progress Notes (Signed)
SICKLE CELL SERVICE PROGRESS NOTE  RAMEL TOBON WUJ:[id number] DOB: [birth year] DOA: 08/27/2012 PCP: Bettejane Leavens A., MD  Assessment/Plan: Active Problems: Acute pulmonary embolism: Pt was admitted with a small acute PE and initially reported that he had been compliant with his Xarelto. However today he reports that he actually missed about 3 doses of Xarelto. With the short half-life of Xarelto this could certainly create a window of opportunity for PE formation. I have discussed with Pulmonary and they agree that the patient should continue with Xarelto. Will resume Xarelto and discontinue Lovenox.  Chronic anticoagulation: See above.  HAP (hospital-acquired pneumonia); Pt is currently on multiple antibiotics for Hospital acquired pneumonia. He still continues to be hypoxic with elevated WBC but no fevers and productive cough. Will narrow spectrum of antibiotics to Cefipime and discontinue Vancomycin.  Hb-SS disease with crisis: Initially on admission, patient had no acute pain however overnight his pain exacerbated and appears to be now in early acute crisis. Will adjust analgesics and increase to clinician assisted doses at 3 mg of Dilaudid every 2 hours. If pain still increased above baseline, will change to PCA with intermittent clinician assisted doses. Increase IVF to 125 ml/hr and continue Toradol.    Code Status: Full Code Family Communication: N/A Disposition Plan: Home in 48 to 72 hours  Mikeal Winstanley A.  Pager [phone]. If 7PM-7AM, please contact night-coverage.  08/28/2012, 3:06 PM  LOS: 1 day   Brief narrative: Pt recently discharged from [facility] hospital after treatment of acute Vaso-occlusive crisis and C-diff. He states that since leavign the pospital he has continued to have pain 7/10 in Left side of chest in the lateral and posterior regions. He sates that the pain is constant and unrelieved by current medications and has been present since prior to his  hospitalization. He is unable to describe the quality of the pain but denies pleurisy. He has been taking his pain medication as prescribed. Pain has not limited his ADL function but has prevented him from being able to go to work.  He also c/o DOE and dizziness that has been occurring since he prior to discharge. He denies any fevers, but states that he has had a none productive cough.  Pt was found to have relative hypoxia on ambulation and was sent for a CT angiogram to R/O PE. It revealed a small PE on the right side and patchy infiltrate bilaterally   Consultants:  None  Procedures:  None  Antibiotics:  Vancomycin 7/28 >>7/29  Cefepime 7/28 >>  HPI/Subjective: Pt states that his breathing has improved and he has no further dizziness. However the pain in the left chest wall is increased since yesterday and is rated at 7/10 after medication.  Objective: Filed Vitals:   08/28/12 0243 08/28/12 0623 08/28/12 1001 08/28/12 1455  BP: 112/70 104/73 120/80 120/80  Pulse: 75 76 79 88  Temp: 98.1 F (36.7 C) 98.2 F (36.8 C) 97.9 F (36.6 C) 98.2 F (36.8 C)  TempSrc: Oral Oral Oral Oral  Resp: 18 16 16 16   Height:      Weight:  171 lb (77.565 kg)    SpO2: 100% 100% 95% 97%   Weight change:   Intake/Output Summary (Last 24 hours) at 08/28/12 1506 Last data filed at 08/28/12 1502  Gross per 24 hour  Intake   1708 ml  Output   1450 ml  Net    258 ml    General: Alert, awake, oriented x3, in no acute distress.  HEENT: Leonore/AT  PEERL, EOMI, mild icterus which is at baseline. Heart: Regular rate and rhythm, without murmurs, rubs, gallops.  Lungs: Clear to auscultation, no wheezing or rhonchi noted.  Abdomen: Soft, nontender, nondistended, positive bowel sounds, no masses no hepatosplenomegaly noted.  Neuro: No focal neurological deficits noted cranial nerves II through XII grossly intact.  Musculoskeletal: No warm swelling or erythema around joints. Psychiatric: Patient alert  and oriented x3, good insight and cognition, good recent to remote recall.   Data Reviewed: Basic Metabolic Panel:  Recent Labs Lab 08/24/12 1020 08/27/12 1835  NA 137 138  K 3.5 3.2*  CL 103 106  CO2 24 22  GLUCOSE 98 101*  BUN 9 5*  CREATININE 0.60 0.55  CALCIUM 9.1 9.3  MG  --  1.7   Liver Function Tests:  Recent Labs Lab 08/24/12 1020 08/27/12 1835  AST 32 21  ALT 18 13  ALKPHOS 74 66  BILITOT 8.7* 5.8*  PROT 7.6 7.3  ALBUMIN 3.9 4.0   No results found for this basename: LIPASE, AMYLASE,  in the last 168 hours No results found for this basename: AMMONIA,  in the last 168 hours CBC:  Recent Labs Lab 08/24/12 1020 08/27/12 1835 08/28/12 0540  WBC 20.9* 22.6* 19.7*  NEUTROABS  --  15.8* 11.4*  HGB 7.6* 7.5* 7.0*  HCT 22.3* 21.8* 20.1*  MCV 91.8 91.6 91.4  PLT 661* 547* 465*   Cardiac Enzymes: No results found for this basename: CKTOTAL, CKMB, CKMBINDEX, TROPONINI,  in the last 168 hours BNP (last 3 results) No results found for this basename: PROBNP,  in the last 8760 hours CBG: No results found for this basename: GLUCAP,  in the last 168 hours  Recent Results (from the past 240 hour(s))  CULTURE, BLOOD (ROUTINE X 2)     Status: None   Collection Time    08/27/12  6:15 PM      Result Value Range Status   Specimen Description BLOOD LEFT HAND   Final   Special Requests BOTTLES DRAWN AEROBIC ONLY 6CC   Final   Culture  Setup Time 08/27/2012 22:27   Final   Culture     Final   Value:        BLOOD CULTURE RECEIVED NO GROWTH TO DATE CULTURE WILL BE HELD FOR 5 DAYS BEFORE ISSUING A FINAL NEGATIVE REPORT   Report Status PENDING   Incomplete  CULTURE, BLOOD (ROUTINE X 2)     Status: None   Collection Time    08/27/12  6:30 PM      Result Value Range Status   Specimen Description BLOOD LEFT ARM   Final   Special Requests BOTTLES DRAWN AEROBIC AND ANAEROBIC 10CC   Final   Culture  Setup Time 08/27/2012 22:26   Final   Culture     Final   Value:         BLOOD CULTURE RECEIVED NO GROWTH TO DATE CULTURE WILL BE HELD FOR 5 DAYS BEFORE ISSUING A FINAL NEGATIVE REPORT   Report Status PENDING   Incomplete     Studies: Dg Chest 2 View  08/18/2012   *RADIOLOGY REPORT*  Clinical Data: Cough.  Sickle cell patient.  CHEST - 2 VIEW  Comparison: 08/13/2012  Findings: There is a left chest wall porta-catheter with tip in the SVC.  There is mild cardiac enlargement.  No pleural effusion or edema identified.  No airspace consolidation identified.  Bony stigmata of sickle cell disease noted.  IMPRESSION:  1. Cardiac  enlargement. 2.  No acute cardiopulmonary abnormalities   Original Report Authenticated By: Signa Kell, M.D.   Dg Chest 2 View  08/13/2012   *RADIOLOGY REPORT*  Clinical Data: Left-sided chest pain.  CHEST - 2 VIEW  Comparison: 07/09/2012 CT.  Findings: Left subclavian power port is present with the position unchanged compared to prior.  The tip is in the superior vena cava just superior to the cavoatrial junction.  Cardiopericardial silhouette remains mildly enlarged for projection.  The chronic pulmonary parenchymal scarring is present with interstitial prominence for age.  No airspace disease.  No focal consolidation or effusion.  Cholecystectomy.  IMPRESSION:  1.  Cardiomegaly eight and interstitial changes compatible with sickle cell anemia. 2.  No radiographic findings of acute chest syndrome.   Original Report Authenticated By: Andreas Newport, M.D.   Ct Angio Chest Pe W/cm &/or Wo Cm  08/27/2012   **ADDENDUM** CREATED: 08/27/2012 17:11:37  Comment:  I communicated this report directly by phone to Dr. Ashley Royalty at the [facility], August 27, 2012, 05:11 p.m.  **END ADDENDUM** SIGNED BY: Rutherford Guys. Margarita Grizzle, M.D.  08/27/2012   *RADIOLOGY REPORT*  Clinical Data: Shortness of breath  CT ANGIOGRAPHY CHEST  Technique:  Multidetector CT imaging of the chest using the standard protocol during bolus administration of intravenous contrast. Multiplanar  reconstructed images including MIPs were obtained and reviewed to evaluate the vascular anatomy.  Contrast: OMNIPAQUE IOHEXOL 350 MG/ML SOLN  Comparison:  Chest radiograph August 18, 2012 and chest CT angiogram March 01, 2012  Findings: There is a pulmonary embolus in a peripheral branch of the superior   right lower lobe superior segment pulmonary artery. There is no thoracic aortic aneurysm or dissection.  There is patchy infiltrate in the anterior and lateral segments of the left lower lobe.  There is also patchy infiltrate in the lateral segment right lower lobe.  There is patchy bibasilar atelectasis.  There is also mild atelectatic change in the posterior segment right upper lobe.  There is mild scarring in the anterior segment left upper lobe medially.  There is a small area of infiltrate in the anterior segment left upper lobe on slice 27.  There are multiple lymph nodes throughout the mediastinum, largest adjacent to the aortic arch.  The largest of these lymph nodes measures 1 x 0.9 cm.  By size criteria, there is no frank adenopathy.  Port-A-Cath tip is at the cavoatrial junction.  Heart is enlarged.  Pericardium is not thickened.  Visualized upper abdominal structures appear normal except for diffuse calcification in the spleen.  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  IMPRESSION: Small pulmonary embolus in a branch of the superior segment right lower lobe pulmonary artery.  No other pulmonary emboli identified.  Bony changes of sickle cell disease.  Splenic changes of sickle cell disease.  Areas of patchy infiltrate bilaterally.  Stable small mediastinal lymph nodes which are stable.   Original Report Authenticated By: Bretta Bang, M.D.    Scheduled Meds: . ceFEPime (MAXIPIME) IV  1 g Intravenous Q8H  . enoxaparin (LOVENOX) injection  80 mg Subcutaneous Q12H  . folic acid  1 mg Oral Daily  .  HYDROmorphone  (DILAUDID) injection  3 mg Intravenous Q2H  . hydroxyurea  1,000 mg Oral Daily  . morphine  15 mg Oral BID  . potassium chloride SA  20 mEq Oral q morning - 10a  . senna-docusate  1 tablet Oral BID  .  vancomycin  1,000 mg Intravenous Q8H   Continuous Infusions: . dextrose 5 % and 0.45% NaCl 125 mL/hr at 08/28/12 1006    Active Problems:   Chronic anticoagulation   Acute pulmonary embolism   Hb-SS disease without crisis   HAP (hospital-acquired pneumonia)

## 2012-08-28 NOTE — Progress Notes (Signed)
Patient ambulated in hallway without O2. Oxygen saturation dropped to 92% on room air. Angelena Form, RN

## 2012-08-29 LAB — CBC WITH DIFFERENTIAL/PLATELET
Basophils Absolute: 0.3 10*3/uL — ABNORMAL HIGH (ref 0.0–0.1)
Eosinophils Absolute: 2.4 10*3/uL — ABNORMAL HIGH (ref 0.0–0.7)
Eosinophils Relative: 10 % — ABNORMAL HIGH (ref 0–5)
Lymphocytes Relative: 17 % (ref 12–46)
MCH: 31.5 pg (ref 26.0–34.0)
MCV: 88.6 fL (ref 78.0–100.0)
Platelets: 493 10*3/uL — ABNORMAL HIGH (ref 150–400)
RDW: 20.5 % — ABNORMAL HIGH (ref 11.5–15.5)
WBC: 23.6 10*3/uL — ABNORMAL HIGH (ref 4.0–10.5)

## 2012-08-29 LAB — COMPREHENSIVE METABOLIC PANEL
ALT: 12 U/L (ref 0–53)
AST: 23 U/L (ref 0–37)
Albumin: 3.7 g/dL (ref 3.5–5.2)
Calcium: 8.5 mg/dL (ref 8.4–10.5)
Sodium: 135 mEq/L (ref 135–145)
Total Protein: 7.1 g/dL (ref 6.0–8.3)

## 2012-08-29 LAB — LEGIONELLA ANTIGEN, URINE

## 2012-08-29 NOTE — Progress Notes (Signed)
Subjective: 32 year old gentleman admitted with pulmonary embolism a sickle cell crisis. Patient reports his pain as 7-8/10 mainly in the chest. He denies shortness of breath or cough. He has mild pain involving his thighs bilaterally, no fever no chills. Denies any diaphoresis. He has been taking his medications as scheduled and is getting relief from his pain medication for at least 2 hours. He has been able to get up and walk around the room.  Objective: Vital signs in last 24 hours: Temp:  [98.6 F (37 C)-99 F (37.2 C)] 98.9 F (37.2 C) (07/30 1430) Pulse Rate:  [87-96] 95 (07/30 1430) Resp:  [16] 16 (07/30 1430) BP: (117-131)/(73-87) 123/87 mmHg (07/30 1430) SpO2:  [91 %-99 %] 99 % (07/30 1430) Weight:  [76.476 kg (168 lb 9.6 oz)] 76.476 kg (168 lb 9.6 oz) (07/30 4098) Weight change: -1.089 kg (-2 lb 6.4 oz) Last BM Date: 08/27/12  Intake/Output from previous day: 07/29 0701 - 07/30 0700 In: 3747 [P.O.:960; I.V.:2787] Out: 1500 [Urine:1500] Intake/Output this shift: Total I/O In: 240 [P.O.:240] Out: -   General appearance: alert, cooperative and no distress Eyes: conjunctivae/corneas clear. PERRL, EOM's intact. Fundi benign. Neck: no adenopathy, no carotid bruit, no JVD, supple, symmetrical, trachea midline and thyroid not enlarged, symmetric, no tenderness/mass/nodules Back: symmetric, no curvature. ROM normal. No CVA tenderness. Resp: clear to auscultation bilaterally Chest wall: no tenderness Cardio: regular rate and rhythm, S1, S2 normal, no murmur, click, rub or gallop GI: soft, non-tender; bowel sounds normal; no masses,  no organomegaly Extremities: extremities normal, atraumatic, no cyanosis or edema Pulses: 2+ and symmetric Skin: Skin color, texture, turgor normal. No rashes or lesions Neurologic: Grossly normal  Lab Results:  Recent Labs  08/28/12 0540 08/29/12 0530  WBC 19.7* 23.6*  HGB 7.0* 6.9*  HCT 20.1* 19.4*  PLT 465* 493*   BMET  Recent  Labs  08/27/12 1835 08/29/12 0530  NA 138 135  K 3.2* 3.6  CL 106 105  CO2 22 22  GLUCOSE 101* 109*  BUN 5* 4*  CREATININE 0.55 0.59  CALCIUM 9.3 8.5    Studies/Results: No results found.  Medications: I have reviewed the patient's current medications.  Assessment/Plan: A 32 year old gentleman admitted with healthcare associated pneumonia as well as pulmonary embolism.  #1 acute pulmonary embolism: Patient currently on Xarelto. We will continue pain control and supportive care. He is aware of the fact that he cannot be missing his doses at home or he will have recurrent PEs.  #2 sickle cell pain: Patient has been offered Dilaudid PCA in place of his current regimen. He has however declined I will rather continue with current pain management. He is getting adequate control when he takes the medications and it lasts close to 2 hours. I'll therefore keep him on his current regimen.  #3 healthcare associated pneumonia: He is currently on antibiotics including cefepime. Continue current management.  #4 history of hypertension: His blood pressure is now well controlled.  LOS: 2 days   Zamzam Whinery,[name] 08/29/2012, 6:09 PM

## 2012-08-29 NOTE — Progress Notes (Signed)
CRITICAL VALUE ALERT  Critical value received:  HGB 6.9  Date of notification:  08/29/12  Time of notification:  0550  Critical value read back:YES  Nurse who received alert:  Jilda Panda  MD notified (1st page):  [facility] , K   Time of first page:  0555  MD notified (2nd page):  Time of second page:  Responding MD: Merdis Delay   Time MD responded:  0600

## 2012-08-30 DIAGNOSIS — I369 Nonrheumatic tricuspid valve disorder, unspecified: Secondary | ICD-10-CM

## 2012-08-30 DIAGNOSIS — D72829 Elevated white blood cell count, unspecified: Secondary | ICD-10-CM | POA: Diagnosis present

## 2012-08-30 LAB — CBC WITH DIFFERENTIAL/PLATELET
Basophils Relative: 1 % (ref 0–1)
Eosinophils Relative: 15 % — ABNORMAL HIGH (ref 0–5)
HCT: 18.8 % — ABNORMAL LOW (ref 39.0–52.0)
Hemoglobin: 7 g/dL — ABNORMAL LOW (ref 13.0–17.0)
Lymphs Abs: 4 10*3/uL (ref 0.7–4.0)
MCH: 32.6 pg (ref 26.0–34.0)
MCV: 87.4 fL (ref 78.0–100.0)
Monocytes Absolute: 2.5 10*3/uL — ABNORMAL HIGH (ref 0.1–1.0)
RBC: 2.15 MIL/uL — ABNORMAL LOW (ref 4.22–5.81)

## 2012-08-30 MED ORDER — ONDANSETRON HCL 4 MG/2ML IJ SOLN: 4.0000 mg | Freq: Four times a day (QID) | INTRAMUSCULAR | Status: DC | PRN

## 2012-08-30 MED ORDER — NALOXONE HCL 0.4 MG/ML IJ SOLN: 0.4000 mg | INTRAMUSCULAR | Status: DC | PRN

## 2012-08-30 MED ORDER — HYDROMORPHONE 0.3 MG/ML IV SOLN
INTRAVENOUS | Status: DC
  Administered 2012-08-30: 4.8 mg via INTRAVENOUS
  Administered 2012-08-30 (×2): via INTRAVENOUS
  Administered 2012-08-30: 4.8 mg via INTRAVENOUS
  Administered 2012-08-31: 0.3 mg via INTRAVENOUS
  Administered 2012-08-31: 1.8 mg via INTRAVENOUS
  Administered 2012-08-31: 4.4 mg via INTRAVENOUS
  Administered 2012-08-31: via INTRAVENOUS
  Administered 2012-08-31: 6.45 mg via INTRAVENOUS
  Administered 2012-08-31: 2.1 mg via INTRAVENOUS
  Administered 2012-08-31: 5 mg via INTRAVENOUS
  Administered 2012-09-01: 4.76 mg via INTRAVENOUS
  Administered 2012-09-01: 12:00:00 via INTRAVENOUS
  Administered 2012-09-01: 4.5 mg via INTRAVENOUS
  Administered 2012-09-01: 2.7 mg via INTRAVENOUS
  Administered 2012-09-01: 3.3 mg via INTRAVENOUS
  Filled 2012-08-30 (×8): qty 25

## 2012-08-30 MED ORDER — SODIUM CHLORIDE 0.9 % IJ SOLN: 9.0000 mL | INTRAMUSCULAR | Status: DC | PRN

## 2012-08-30 MED ORDER — HYDROMORPHONE HCL PF 2 MG/ML IJ SOLN
3.0000 mg | INTRAMUSCULAR | Status: DC | PRN
  Administered 2012-08-30 – 2012-09-02 (×25): 3 mg via INTRAVENOUS
  Filled 2012-08-30 (×24): qty 2

## 2012-08-30 NOTE — Telephone Encounter (Signed)
Telephone encounter.

## 2012-08-30 NOTE — Progress Notes (Signed)
SICKLE CELL SERVICE PROGRESS NOTE  [name] BJY:[id number] DOB: September 07, 1980 DOA: 08/27/2012 PCP: Jemmie Ledgerwood A., MD  Assessment/Plan: Active Problems: Acute pulmonary embolism: Pt was admitted with a small acute PE and initially reported that he had been compliant with his Xarelto. However today he reports that he actually missed about 3 doses of Xarelto. With the short half-life of Xarelto this could certainly create a window of opportunity for PE formation. I have discussed with Pulmonary and they agree that the patient should continue with Xarelto. Will resume Xarelto and discontinue Lovenox.  Chronic anticoagulation: See above.  HAP (hospital-acquired pneumonia): Pt is currently on multiple antibiotics for Hospital acquired pneumonia. He still continues to be hypoxic with elevated WBC but no fevers and productive cough. Will narrow spectrum of antibiotics to Cefipime and discontinue Vancomycin.  Hb-SS disease with crisis: Initially on admission, patient had no acute pain however overnight his pain exacerbated and appears to be now in early acute crisis. Will adjust analgesics and increase to clinician assisted doses at 3 mg of Dilaudid every 2 hours. If pain still increased above baseline, will change to PCA with intermittent clinician assisted doses. Increase IVF to 125 ml/hr and continue Toradol.    Code Status: Full Code Family Communication: N/A Disposition Plan: Home in 48 to 72 hours  Abigayle Wilinski A.  Pager [phone]. If 7PM-7AM, please contact night-coverage.  08/30/2012, 12:23 PM  LOS: 3 days   Brief narrative: Pt recently discharged from [facility] after treatment of acute Vaso-occlusive crisis and C-diff. He states that since leavign the pospital he has continued to have pain 7/10 in Left side of chest in the lateral and posterior regions. He sates that the pain is constant and unrelieved by current medications and has been present since prior to his  hospitalization. He is unable to describe the quality of the pain but denies pleurisy. He has been taking his pain medication as prescribed. Pain has not limited his ADL function but has prevented him from being able to go to work.  He also c/o DOE and dizziness that has been occurring since he prior to discharge. He denies any fevers, but states that he has had a none productive cough.  Pt was found to have relative hypoxia on ambulation and was sent for a CT angiogram to R/O PE. It revealed a small PE on the right side and patchy infiltrate bilaterally   Consultants:  None  Procedures:  None  Antibiotics:  Vancomycin 7/28 >>7/29  Cefepime 7/28 >>  HPI/Subjective: Pt states that his breathing has improved and he has no further dizziness. However the pain in the left chest wall is increased since yesterday and is rated at 7/10 after medication.  Objective: Filed Vitals:   08/29/12 2121 08/30/12 0131 08/30/12 0650 08/30/12 1000  BP: 123/83 121/82 115/72 111/70  Pulse: 92 83 81 84  Temp: 98.4 F (36.9 C) 99 F (37.2 C) 98.9 F (37.2 C) 98.9 F (37.2 C)  TempSrc: Oral Oral Oral Oral  Resp: 18 18 18 16   Height:      Weight:      SpO2: 95% 96% 94% 97%   Weight change:   Intake/Output Summary (Last 24 hours) at 08/30/12 1223 Last data filed at 08/30/12 1000  Gross per 24 hour  Intake   3157 ml  Output   2850 ml  Net    307 ml    General: Alert, awake, oriented x3, in no acute distress.  HEENT: [place]/AT PEERL, EOMI, mild  icterus which is at baseline. Heart: Regular rate and rhythm, without murmurs, rubs, gallops.  Lungs: Clear to auscultation, no wheezing or rhonchi noted.  Abdomen: Soft, nontender, nondistended, positive bowel sounds, no masses no hepatosplenomegaly noted.  Neuro: No focal neurological deficits noted cranial nerves II through XII grossly intact.  Musculoskeletal: No warm swelling or erythema around joints. Psychiatric: Patient alert and oriented x3, good  insight and cognition, good recent to remote recall.   Data Reviewed: Basic Metabolic Panel:  Recent Labs Lab 08/27/12 1835 08/29/12 0530  NA 138 135  K 3.2* 3.6  CL 106 105  CO2 22 22  GLUCOSE 101* 109*  BUN 5* 4*  CREATININE 0.55 0.59  CALCIUM 9.3 8.5  MG 1.7  --    Liver Function Tests:  Recent Labs Lab 08/27/12 1835 08/29/12 0530  AST 21 23  ALT 13 12  ALKPHOS 66 71  BILITOT 5.8* 5.4*  PROT 7.3 7.1  ALBUMIN 4.0 3.7   CBC:  Recent Labs Lab 08/27/12 1835 08/28/12 0540 08/29/12 0530 08/30/12 0410  WBC 22.6* 19.7* 23.6* 22.3*  NEUTROABS 15.8* 11.4* 13.7* 12.3*  HGB 7.5* 7.0* 6.9* 7.0*  HCT 21.8* 20.1* 19.4* 18.8*  MCV 91.6 91.4 88.6 87.4  PLT 547* 465* 493* 455*    Recent Results (from the past 240 hour(s))  CULTURE, BLOOD (ROUTINE X 2)     Status: None   Collection Time    08/27/12  6:15 PM      Result Value Range Status   Specimen Description BLOOD LEFT HAND   Final   Special Requests BOTTLES DRAWN AEROBIC ONLY 6CC   Final   Culture  Setup Time 08/27/2012 22:27   Final   Culture     Final   Value:        BLOOD CULTURE RECEIVED NO GROWTH TO DATE CULTURE WILL BE HELD FOR 5 DAYS BEFORE ISSUING A FINAL NEGATIVE REPORT   Report Status PENDING   Incomplete  CULTURE, BLOOD (ROUTINE X 2)     Status: None   Collection Time    08/27/12  6:30 PM      Result Value Range Status   Specimen Description BLOOD LEFT ARM   Final   Special Requests BOTTLES DRAWN AEROBIC AND ANAEROBIC 10CC   Final   Culture  Setup Time 08/27/2012 22:26   Final   Culture     Final   Value:        BLOOD CULTURE RECEIVED NO GROWTH TO DATE CULTURE WILL BE HELD FOR 5 DAYS BEFORE ISSUING A FINAL NEGATIVE REPORT   Report Status PENDING   Incomplete     Studies:  Ct Angio Chest Pe W/cm &/or Wo Cm  08/27/2012   **ADDENDUM** CREATED: 08/27/2012 17:11:37  Comment:  I communicated this report directly by phone to Dr. Ashley Royalty at the [facility], August 27, 2012, 05:11 p.m.  **END  ADDENDUM** SIGNED BY: Rutherford Guys. Margarita Grizzle, M.D.  08/27/2012   *RADIOLOGY REPORT*  Clinical Data: Shortness of breath  CT ANGIOGRAPHY CHEST  Technique:  Multidetector CT imaging of the chest using the standard protocol during bolus administration of intravenous contrast. Multiplanar reconstructed images including MIPs were obtained and reviewed to evaluate the vascular anatomy.  Contrast: OMNIPAQUE IOHEXOL 350 MG/ML SOLN  Comparison:  Chest radiograph August 18, 2012 and chest CT angiogram March 01, 2012  Findings: There is a pulmonary embolus in a peripheral branch of the superior   right lower lobe superior segment pulmonary artery.  There is no thoracic aortic aneurysm or dissection.  There is patchy infiltrate in the anterior and lateral segments of the left lower lobe.  There is also patchy infiltrate in the lateral segment right lower lobe.  There is patchy bibasilar atelectasis.  There is also mild atelectatic change in the posterior segment right upper lobe.  There is mild scarring in the anterior segment left upper lobe medially.  There is a small area of infiltrate in the anterior segment left upper lobe on slice 27.  There are multiple lymph nodes throughout the mediastinum, largest adjacent to the aortic arch.  The largest of these lymph nodes measures 1 x 0.9 cm.  By size criteria, there is no frank adenopathy.  Port-A-Cath tip is at the cavoatrial junction.  Heart is enlarged.  Pericardium is not thickened.  Visualized upper abdominal structures appear normal except for diffuse calcification in the spleen.  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  IMPRESSION: Small pulmonary embolus in a branch of the superior segment right lower lobe pulmonary artery.  No other pulmonary emboli identified.  Bony changes of sickle cell disease.  Splenic changes of sickle cell disease.  Areas of patchy infiltrate bilaterally.  Stable small  mediastinal lymph nodes which are stable.   Original Report Authenticated By: Bretta Bang, M.D.    Scheduled Meds: . ceFEPime (MAXIPIME) IV  1 g Intravenous Q8H  . folic acid  1 mg Oral Daily  . HYDROmorphone PCA 0.3 mg/mL   Intravenous Q4H  . hydroxyurea  1,000 mg Oral Daily  . morphine  15 mg Oral BID  . potassium chloride SA  20 mEq Oral q morning - 10a  . rivaroxaban  20 mg Oral Q supper  . senna-docusate  1 tablet Oral BID   Continuous Infusions: . dextrose 5 % and 0.45% NaCl 125 mL/hr at 08/30/12 0731

## 2012-08-30 NOTE — Progress Notes (Signed)
  Echocardiogram 2D Echocardiogram has been performed.  [name] 08/30/2012, 12:54 PM

## 2012-08-31 LAB — LACTATE DEHYDROGENASE: LDH: 527 U/L — ABNORMAL HIGH (ref 94–250)

## 2012-08-31 LAB — CBC WITH DIFFERENTIAL/PLATELET
Basophils Relative: 1 % (ref 0–1)
Eosinophils Absolute: 3.2 10*3/uL — ABNORMAL HIGH (ref 0.0–0.7)
Eosinophils Relative: 13 % — ABNORMAL HIGH (ref 0–5)
HCT: 18.6 % — ABNORMAL LOW (ref 39.0–52.0)
Hemoglobin: 6.7 g/dL — CL (ref 13.0–17.0)
Lymphocytes Relative: 11 % — ABNORMAL LOW (ref 12–46)
MCH: 31.6 pg (ref 26.0–34.0)
MCHC: 36 g/dL (ref 30.0–36.0)
Monocytes Absolute: 2.9 10*3/uL — ABNORMAL HIGH (ref 0.1–1.0)
Neutro Abs: 15.3 10*3/uL — ABNORMAL HIGH (ref 1.7–7.7)
RBC: 2.12 MIL/uL — ABNORMAL LOW (ref 4.22–5.81)

## 2012-08-31 NOTE — Progress Notes (Signed)
Subjective: 32 year old gentleman admitted with pulmonary embolism a sickle cell crisis. Patient's pain is now 6/10 on Dilaudid PCA. He is feeling tired. Pain is localized to his chest wall. No fever, no cough, no NVD. No Diaphoresis. Objective: Vital signs in last 24 hours: Temp:  [98.2 F (36.8 C)-98.7 F (37.1 C)] 98.2 F (36.8 C) (08/01 1410) Pulse Rate:  [83-107] 90 (08/01 1410) Resp:  [14-19] 16 (08/01 1410) BP: (105-129)/(65-80) 114/73 mmHg (08/01 1410) SpO2:  [93 %-97 %] 94 % (08/01 1410) FiO2 (%):  [93 %-96 %] 96 % (08/01 1234) Weight change:  Last BM Date: 08/29/12  Intake/Output from previous day: 07/31 0701 - 08/01 0700 In: 480 [P.O.:480] Out: 2550 [Urine:2550] Intake/Output this shift: Total I/O In: 360 [P.O.:360] Out: 725 [Urine:725]  General appearance: alert, cooperative and no distress Eyes: conjunctivae/corneas clear. PERRL, EOM's intact. Fundi benign. Neck: no adenopathy, no carotid bruit, no JVD, supple, symmetrical, trachea midline and thyroid not enlarged, symmetric, no tenderness/mass/nodules Back: symmetric, no curvature. ROM normal. No CVA tenderness. Resp: clear to auscultation bilaterally Chest wall: no tenderness Cardio: regular rate and rhythm, S1, S2 normal, no murmur, click, rub or gallop GI: soft, non-tender; bowel sounds normal; no masses,  no organomegaly Extremities: extremities normal, atraumatic, no cyanosis or edema Pulses: 2+ and symmetric Skin: Skin color, texture, turgor normal. No rashes or lesions Neurologic: Grossly normal  Lab Results:  Recent Labs  08/30/12 0410 08/31/12 0840  WBC 22.3* 24.3*  HGB 7.0* 6.7*  HCT 18.8* 18.6*  PLT 455* 492*   BMET  Recent Labs  08/29/12 0530  NA 135  K 3.6  CL 105  CO2 22  GLUCOSE 109*  BUN 4*  CREATININE 0.59  CALCIUM 8.5    Studies/Results: No results found.  Medications: I have reviewed the patient's current medications.  Assessment/Plan: A 32 year old gentleman  admitted with healthcare associated pneumonia as well as pulmonary embolism.  #1 acute pulmonary embolism: Patient currently on Xarelto. Continue treatment. Also pain control for pleuritic chest pain.  #2 sickle cell pain: Patient is on Dilaudid PCA now. Will continue another day and then titrate off.  #3 healthcare associated pneumonia: He is currently on antibiotics including cefepime. Continue current management.  #4 history of hypertension: His blood pressure is now well controlled.  LOS: 4 days   Dunbar Buras,[name] 08/31/2012, 3:35 PM

## 2012-09-01 LAB — PREPARE RBC (CROSSMATCH)

## 2012-09-01 NOTE — Progress Notes (Signed)
Subjective: 32 year old gentleman admitted with pulmonary embolism a sickle cell crisis. Patient's pain is now 6/10 on Dilaudid PCA. No fever no cough no nausea vomiting or diarrhea. Patient believes the Dilaudid PCA is not helping him. He is asking for the breakthrough dose constantly.  Objective: Vital signs in last 24 hours: Temp:  [98.5 F (36.9 C)-98.7 F (37.1 C)] 98.7 F (37.1 C) (08/02 0609) Pulse Rate:  [81-87] 81 (08/02 0609) Resp:  [15-19] 15 (08/02 1223) BP: (116-122)/(68-73) 117/70 mmHg (08/02 0609) SpO2:  [92 %-96 %] 93 % (08/02 1223) FiO2 (%):  [93 %] 93 % (08/01 1600) Weight change:  Last BM Date: 08/29/12  Intake/Output from previous day: 08/01 0701 - 08/02 0700 In: 1434 [P.O.:960; I.V.:324; IV Piggyback:150] Out: 2175 [Urine:2175] Intake/Output this shift:    General appearance: alert, cooperative and no distress Eyes: conjunctivae/corneas clear. PERRL, EOM's intact. Fundi benign. Neck: no adenopathy, no carotid bruit, no JVD, supple, symmetrical, trachea midline and thyroid not enlarged, symmetric, no tenderness/mass/nodules Back: symmetric, no curvature. ROM normal. No CVA tenderness. Resp: clear to auscultation bilaterally Chest wall: no tenderness Cardio: regular rate and rhythm, S1, S2 normal, no murmur, click, rub or gallop GI: soft, non-tender; bowel sounds normal; no masses,  no organomegaly Extremities: extremities normal, atraumatic, no cyanosis or edema Pulses: 2+ and symmetric Skin: Skin color, texture, turgor normal. No rashes or lesions Neurologic: Grossly normal  Lab Results:  Recent Labs  08/30/12 0410 08/31/12 0840  WBC 22.3* 24.3*  HGB 7.0* 6.7*  HCT 18.8* 18.6*  PLT 455* 492*   BMET No results found for this basename: NA, K, CL, CO2, GLUCOSE, BUN, CREATININE, CALCIUM,  in the last 72 hours  Studies/Results: No results found.  Medications: I have reviewed the patient's current medications.  Assessment/Plan: A 32 year old  gentleman admitted with healthcare associated pneumonia as well as pulmonary embolism.  #1 acute pulmonary embolism: Patient currently on Xarelto. Continue treatment. He has been counseled on medications compliance.  #2 sickle cell pain: Patient is on Dilaudid PCA now. I will discontinue the PCA and use scheduled doses every 2-3 hours.  #3 healthcare associated pneumonia: He is currently on antibiotics including cefepime. Continue current management. Will switch to oral regimen at time of discharge  #4 history of hypertension: His blood pressure is now well controlled. continue current medications   LOS: 5 days   [name],[name] 09/01/2012, 2:32 PM

## 2012-09-02 LAB — CBC WITH DIFFERENTIAL/PLATELET
Basophils Absolute: 0.2 10*3/uL — ABNORMAL HIGH (ref 0.0–0.1)
Basophils Relative: 1 % (ref 0–1)
Eosinophils Absolute: 2 10*3/uL — ABNORMAL HIGH (ref 0.0–0.7)
HCT: 20.9 % — ABNORMAL LOW (ref 39.0–52.0)
Hemoglobin: 7.4 g/dL — ABNORMAL LOW (ref 13.0–17.0)
Lymphs Abs: 3.4 10*3/uL (ref 0.7–4.0)
MCH: 31.1 pg (ref 26.0–34.0)
MCHC: 35.4 g/dL (ref 30.0–36.0)
MCV: 87.8 fL (ref 78.0–100.0)
Monocytes Absolute: 2.4 10*3/uL — ABNORMAL HIGH (ref 0.1–1.0)
Neutro Abs: 11.9 10*3/uL — ABNORMAL HIGH (ref 1.7–7.7)
RDW: 22 % — ABNORMAL HIGH (ref 11.5–15.5)

## 2012-09-02 LAB — CULTURE, BLOOD (ROUTINE X 2): Culture: NO GROWTH

## 2012-09-02 MED ORDER — HEPARIN SOD (PORK) LOCK FLUSH 100 UNIT/ML IV SOLN
INTRAVENOUS | Status: AC
  Administered 2012-09-02: 14:00:00
  Filled 2012-09-02: qty 5

## 2012-09-02 MED ORDER — LEVOFLOXACIN 750 MG PO TABS: 750.0000 mg | ORAL_TABLET | Freq: Every day | ORAL | Status: DC

## 2012-09-02 NOTE — Discharge Summary (Addendum)
Physician Discharge Summary  Patient ID: [name] MRN: [id number] DOB/AGE: 1980-09-13 32 y.o.  Admit date: 08/27/2012 Discharge date: 09/02/2012  Admission Diagnoses:  Discharge Diagnoses:  Active Problems:   Chronic anticoagulation   Acute pulmonary embolism   Hb-SS disease without crisis   HAP (hospital-acquired pneumonia)   Leukocytosis, unspecified   Discharged Condition: good  Hospital Course: A 32 year old gentleman admitted with acute pulmonary embolism, sickle cell painful crisis, hospital acquired pneumonia and medication noncompliance. Patient was admitted with chest pain. He has missed his Xarelto doses prior to coming in. He had prior history of pulmonary embolism and has another one this admission. Xarelto was restarted in the hospital, IV hydration and IV pain medication given. He  responded to treatment promptly. His pain is under good control. He's been transitioned to his oral medications. He was also treated with antibiotics including cefepime and vancomycin. He'll continue with levofloxacin for 7 days to complete treatment for his pneumonia. The time of discharge his hemoglobin and other labs at baseline. He received one unit of packed red blood cells so far. Patient has received counseling on medications compliance. He'll have a followup in the sickle cell clinic.  Consults: None  Significant Diagnostic Studies: labs: Patient had CT angiogram of the chest, numerous CBCs and CMPs as well as chest x-rays. His last hemoglobin is 7.4  Treatments: IV hydration, antibiotics: Cefepime and analgesia: Dilaudid  Discharge Exam: Blood pressure 101/68, pulse 70, temperature 98.7 F (37.1 C), temperature source Oral, resp. rate 15, height 6' (1.829 m), weight 76.476 kg (168 lb 9.6 oz), SpO2 97.00%. General appearance: alert, cooperative and appears stated age Head: Normocephalic, without obvious abnormality, atraumatic Neck: no adenopathy, no carotid bruit, no JVD,  supple, symmetrical, trachea midline and thyroid not enlarged, symmetric, no tenderness/mass/nodules Back: symmetric, no curvature. ROM normal. No CVA tenderness. Resp: clear to auscultation bilaterally Chest wall: no tenderness Cardio: regular rate and rhythm, S1, S2 normal, no murmur, click, rub or gallop GI: soft, non-tender; bowel sounds normal; no masses,  no organomegaly Pulses: 2+ and symmetric Skin: Skin color, texture, turgor normal. No rashes or lesions Neurologic: Grossly normal  Disposition: 01-Home or Self Care     Medication List         celecoxib 200 MG capsule  Commonly known as:  CELEBREX  Take 1 capsule (200 mg total) by mouth 2 (two) times daily as needed for pain.     [employer] ACIDOPHILUS PROBIOTIC Tabs  Take 1 tablet by mouth 2 (two) times daily with a meal.     folic acid 1 MG tablet  Commonly known as:  FOLVITE  Take 1 tablet (1 mg total) by mouth every morning.     HYDROmorphone 4 MG tablet  Commonly known as:  DILAUDID  Take 1 tablet (4 mg total) by mouth every 4 (four) hours as needed for pain.     hydroxyurea 500 MG capsule  Commonly known as:  HYDREA  Take 2 capsules (1,000 mg total) by mouth daily. May take with food to minimize GI side effects.     levofloxacin 750 MG tablet  Commonly known as:  LEVAQUIN  Take 1 tablet (750 mg total) by mouth daily.     morphine 15 MG 12 hr tablet  Commonly known as:  MS CONTIN  Take 1 tablet (15 mg total) by mouth 2 (two) times daily.     potassium chloride SA 20 MEQ tablet  Commonly known as:  K-DUR,KLOR-CON  Take 1 tablet (20  mEq total) by mouth every morning.     XARELTO 20 MG Tabs  Generic drug:  Rivaroxaban  Take 20 mg by mouth every morning.     zolpidem 10 MG tablet  Commonly known as:  AMBIEN  Take 1 tablet (10 mg total) by mouth at bedtime as needed for sleep. For sleep         Signed: [name],[name] 09/02/2012, 1:42 PM  Time spent 40 minutes

## 2012-09-02 NOTE — Progress Notes (Signed)
09/02/12 1405 Reviewed discharge instructions with patient. Patient verbalized understanding of discharge instructions. Prescriptions sent to pharmacy electronically. Pt aware of prescription and location. Copy of discharge given to patient.

## 2012-09-02 NOTE — Care Management Note (Signed)
    Page 1 of 1   09/02/2012     3:27:39 PM   CARE MANAGEMENT NOTE 09/02/2012  Patient:  [name], [name]   Account Number:  [id number]  Date Initiated:  08/28/2012  Documentation initiated by:  Colleen Can  Subjective/Objective Assessment:   Dx PE and pneumonia, SS     Action/Plan:   home upon discharge   Anticipated DC Date:  09/02/2012   Anticipated DC Plan:  HOME/SELF CARE      DC Planning Services  CM consult      Choice offered to / List presented to:             Status of service:  Completed, signed off Medicare Important Message given?   (If response is "NO", the following Medicare IM given date fields will be blank) Date Medicare IM given:   Date Additional Medicare IM given:    Discharge Disposition:  HOME/SELF CARE  Per UR Regulation:  Reviewed for med. necessity/level of care/duration of stay  If discussed at Long Length of Stay Meetings, dates discussed:    Comments:

## 2012-09-05 LAB — TYPE AND SCREEN
Antibody Screen: NEGATIVE
Unit division: 0

## 2012-09-10 ENCOUNTER — Emergency Department (HOSPITAL_COMMUNITY): Payer: Medicare Other

## 2012-09-10 ENCOUNTER — Encounter (HOSPITAL_COMMUNITY): Payer: Self-pay

## 2012-09-10 ENCOUNTER — Emergency Department (HOSPITAL_COMMUNITY)
Admission: EM | Admit: 2012-09-10 | Discharge: 2012-09-10 | Disposition: A | Discharge status: Home / Self Care | Payer: Medicare Other | Attending: Emergency Medicine | Admitting: Emergency Medicine

## 2012-09-10 DIAGNOSIS — Z8679 Personal history of other diseases of the circulatory system: Secondary | ICD-10-CM | POA: Diagnosis not present

## 2012-09-10 DIAGNOSIS — Z8619 Personal history of other infectious and parasitic diseases: Secondary | ICD-10-CM | POA: Diagnosis not present

## 2012-09-10 DIAGNOSIS — Z862 Personal history of diseases of the blood and blood-forming organs and certain disorders involving the immune mechanism: Secondary | ICD-10-CM | POA: Insufficient documentation

## 2012-09-10 DIAGNOSIS — Z8739 Personal history of other diseases of the musculoskeletal system and connective tissue: Secondary | ICD-10-CM | POA: Insufficient documentation

## 2012-09-10 DIAGNOSIS — R079 Chest pain, unspecified: Secondary | ICD-10-CM | POA: Diagnosis not present

## 2012-09-10 DIAGNOSIS — D5700 Hb-SS disease with crisis, unspecified: Secondary | ICD-10-CM | POA: Insufficient documentation

## 2012-09-10 DIAGNOSIS — E876 Hypokalemia: Secondary | ICD-10-CM | POA: Insufficient documentation

## 2012-09-10 DIAGNOSIS — Z8659 Personal history of other mental and behavioral disorders: Secondary | ICD-10-CM | POA: Insufficient documentation

## 2012-09-10 DIAGNOSIS — Z79899 Other long term (current) drug therapy: Secondary | ICD-10-CM | POA: Insufficient documentation

## 2012-09-10 DIAGNOSIS — M545 Low back pain, unspecified: Secondary | ICD-10-CM | POA: Diagnosis not present

## 2012-09-10 DIAGNOSIS — Z86711 Personal history of pulmonary embolism: Secondary | ICD-10-CM | POA: Diagnosis not present

## 2012-09-10 DIAGNOSIS — Z87891 Personal history of nicotine dependence: Secondary | ICD-10-CM | POA: Diagnosis not present

## 2012-09-10 DIAGNOSIS — D473 Essential (hemorrhagic) thrombocythemia: Secondary | ICD-10-CM | POA: Insufficient documentation

## 2012-09-10 DIAGNOSIS — Z7901 Long term (current) use of anticoagulants: Secondary | ICD-10-CM | POA: Insufficient documentation

## 2012-09-10 DIAGNOSIS — I10 Essential (primary) hypertension: Secondary | ICD-10-CM | POA: Insufficient documentation

## 2012-09-10 LAB — CBC WITH DIFFERENTIAL/PLATELET
Basophils Absolute: 0.1 10*3/uL (ref 0.0–0.1)
Eosinophils Relative: 3 % (ref 0–5)
Lymphocytes Relative: 16 % (ref 12–46)
Lymphs Abs: 2.6 10*3/uL (ref 0.7–4.0)
MCV: 94.3 fL (ref 78.0–100.0)
Neutro Abs: 11.1 10*3/uL — ABNORMAL HIGH (ref 1.7–7.7)
Neutrophils Relative %: 68 % (ref 43–77)
Platelets: 565 10*3/uL — ABNORMAL HIGH (ref 150–400)
RBC: 2.62 MIL/uL — ABNORMAL LOW (ref 4.22–5.81)
WBC: 16.4 10*3/uL — ABNORMAL HIGH (ref 4.0–10.5)

## 2012-09-10 LAB — URINALYSIS, ROUTINE W REFLEX MICROSCOPIC
Glucose, UA: NEGATIVE mg/dL
Hgb urine dipstick: NEGATIVE
Specific Gravity, Urine: 1.017 (ref 1.005–1.030)
pH: 6 (ref 5.0–8.0)

## 2012-09-10 LAB — BASIC METABOLIC PANEL
CO2: 25 mEq/L (ref 19–32)
Chloride: 104 mEq/L (ref 96–112)
Glucose, Bld: 105 mg/dL — ABNORMAL HIGH (ref 70–99)
Potassium: 3.4 mEq/L — ABNORMAL LOW (ref 3.5–5.1)
Sodium: 138 mEq/L (ref 135–145)

## 2012-09-10 LAB — RETICULOCYTES
RBC.: 2.62 MIL/uL — ABNORMAL LOW (ref 4.22–5.81)
Retic Ct Pct: 13.7 % — ABNORMAL HIGH (ref 0.4–3.1)

## 2012-09-10 LAB — URINE MICROSCOPIC-ADD ON

## 2012-09-10 MED ORDER — HEPARIN (PORCINE) LOCK FLUSH 10 UNIT/ML IV SOLN
10.0000 [IU] | Freq: Once | INTRAVENOUS | Status: DC
  Filled 2012-09-10: qty 1

## 2012-09-10 MED ORDER — HYDROMORPHONE HCL PF 1 MG/ML IJ SOLN: 1.0000 mg | Freq: Once | INTRAMUSCULAR | Status: DC

## 2012-09-10 MED ORDER — SODIUM CHLORIDE 0.9 % IV SOLN
1000.0000 mL | INTRAVENOUS | Status: DC
  Administered 2012-09-10 (×2): 1000 mL via INTRAVENOUS

## 2012-09-10 MED ORDER — DIPHENHYDRAMINE HCL 25 MG PO CAPS
25.0000 mg | ORAL_CAPSULE | Freq: Once | ORAL | Status: AC
  Administered 2012-09-10: 25 mg via ORAL
  Filled 2012-09-10: qty 1

## 2012-09-10 MED ORDER — HYDROMORPHONE HCL PF 2 MG/ML IJ SOLN
2.0000 mg | Freq: Once | INTRAMUSCULAR | Status: AC
  Administered 2012-09-10: 2 mg via INTRAVENOUS
  Filled 2012-09-10: qty 1

## 2012-09-10 MED ORDER — ONDANSETRON HCL 4 MG/2ML IJ SOLN
4.0000 mg | Freq: Four times a day (QID) | INTRAMUSCULAR | Status: DC | PRN
  Administered 2012-09-10: 4 mg via INTRAVENOUS
  Filled 2012-09-10: qty 2

## 2012-09-10 MED ORDER — HEPARIN SOD (PORK) LOCK FLUSH 100 UNIT/ML IV SOLN
INTRAVENOUS | Status: AC
  Administered 2012-09-10: 500 [IU]
  Filled 2012-09-10: qty 5

## 2012-09-10 MED ORDER — HYDROMORPHONE HCL PF 1 MG/ML IJ SOLN
1.0000 mg | INTRAMUSCULAR | Status: DC | PRN
  Administered 2012-09-10 (×2): 1 mg via INTRAVENOUS
  Filled 2012-09-10 (×2): qty 1

## 2012-09-10 MED ORDER — SODIUM CHLORIDE 0.9 % IV SOLN
1000.0000 mL | Freq: Once | INTRAVENOUS | Status: AC
  Administered 2012-09-10: 1000 mL via INTRAVENOUS

## 2012-09-10 NOTE — ED Provider Notes (Signed)
CSN: [id number]     Arrival date & time 09/10/12  0306 History     None    Chief Complaint  Patient presents with  . Sickle Cell Pain Crisis   (Consider location/radiation/quality/duration/timing/severity/associated sxs/prior Treatment) HPI History provided by pt.   Pt c/o sickle cell pain across lower ribs as well as in lower back for the past 2 days.  Pain typical.  No associated fever, cough, dyspnea, LE weakness/paresthesias.  Has not had relief w/ dilaudid or MS contin.  Denies trauma. Past Medical History  Diagnosis Date  . Sickle cell anemia   . Blood transfusion   . 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 Hypokalemia   . Mood disorder   . Pulmonary embolism   . Avascular necrosis   . Leukocytosis     Chronic  . Thrombocytosis     Chronic  . Hypertension   . C. difficile colitis    Past Surgical History  Procedure Laterality Date  . Right hip replacement      08/2006  . Cholecystectomy      01/2008  . Porta cath placement    . Porta cath removal    . Umbilical hernia repair      01/2008  . Excision of left periauricular cyst      10/2009  . Excision of right ear lobe cyst with primary closur      11/2007  . Portacath placement  01/05/2012    Procedure: INSERTION PORT-A-CATH;  Surgeon: Adolph Pollack, MD;  Location: [facility] OR;  Service: General;  Laterality: N/A;  ultrasound guiced port a cath insertion with fluoroscopy   Family History  Problem Relation Age of Onset  . Sickle cell anemia Mother   . Sickle cell anemia Father   . Sickle cell trait Brother    History  Substance Use Topics  . Smoking status: Former Smoker -- 13 years    Types: Cigarettes    Quit date: 07/08/2010  . Smokeless tobacco: Never Used  . Alcohol Use: No    Review of Systems  All other systems reviewed and are negative.    Allergies  Morphine and related  Home Medications   Current Outpatient Rx  Name  Route  Sig  Dispense  Refill  . celecoxib  (CELEBREX) 200 MG capsule   Oral   Take 1 capsule (200 mg total) by mouth 2 (two) times daily as needed for pain.   30 capsule   0   . folic acid (FOLVITE) 1 MG tablet   Oral   Take 1 tablet (1 mg total) by mouth every morning.   30 tablet   11   . HYDROmorphone (DILAUDID) 4 MG tablet   Oral   Take 1 tablet (4 mg total) by mouth every 4 (four) hours as needed for pain.   90 tablet   0   . hydroxyurea (HYDREA) 500 MG capsule   Oral   Take 2 capsules (1,000 mg total) by mouth daily. May take with food to minimize GI side effects.   60 capsule   1   . morphine (MS CONTIN) 15 MG 12 hr tablet   Oral   Take 1 tablet (15 mg total) by mouth 2 (two) times daily.   60 tablet   0   . potassium chloride SA (K-DUR,KLOR-CON) 20 MEQ tablet   Oral   Take 1 tablet (20 mEq total) by mouth every morning.   30 tablet  3   . Rivaroxaban (XARELTO) 20 MG TABS   Oral   Take 20 mg by mouth every morning.          . zolpidem (AMBIEN) 10 MG tablet   Oral   Take 1 tablet (10 mg total) by mouth at bedtime as needed for sleep. For sleep   30 tablet   1    BP 112/68  Pulse 90  Temp(Src) 99.5 F (37.5 C) (Oral)  Resp 18  Ht 6' (1.829 m)  Wt 173 lb (78.472 kg)  BMI 23.46 kg/m2  SpO2 98% Physical Exam  Nursing note and vitals reviewed. Constitutional: He is oriented to person, place, and time. He appears well-developed and well-nourished. No distress.  HENT:  Head: Normocephalic and atraumatic.  Eyes:  Normal appearance  Neck: Normal range of motion.  Cardiovascular: Normal rate and regular rhythm.   Pulmonary/Chest: Effort normal and breath sounds normal. No respiratory distress.  Musculoskeletal: Normal range of motion.  Entire low back non-tender. 2+ DP pulse and distal sensation intact.  Full ROM LEs.   Neurological: He is alert and oriented to person, place, and time.  Skin: Skin is warm and dry. No rash noted.  Psychiatric: He has a normal mood and affect. His behavior is  normal.    ED Course   Procedures (including critical care time)  Labs Reviewed  CBC WITH DIFFERENTIAL - Abnormal; Notable for the following:    WBC 16.4 (*)    RBC 2.62 (*)    Hemoglobin 8.3 (*)    HCT 24.7 (*)    RDW 20.4 (*)    Platelets 565 (*)    Neutro Abs 11.1 (*)    Monocytes Absolute 2.0 (*)    All other components within normal limits  BASIC METABOLIC PANEL - Abnormal; Notable for the following:    Potassium 3.4 (*)    Glucose, Bld 105 (*)    All other components within normal limits  RETICULOCYTES - Abnormal; Notable for the following:    Retic Ct Pct 13.7 (*)    RBC. 2.62 (*)    Retic Count, Manual 358.9 (*)    All other components within normal limits  URINALYSIS, ROUTINE W REFLEX MICROSCOPIC   Dg Chest 2 View  09/10/2012   *RADIOLOGY REPORT*  Clinical Data: Sickle cell pain crisis.  CHEST - 2 VIEW  Comparison: 08/18/2012.  Findings: Unchanged appearance of left subclavian portacatheter, tip at the superior cavoatrial junction.  Stable osseous findings, including bilateral proximal humeral osteonecrosis.  Chronic cardiomegaly.  Unremarkable upper mediastinal contours.  Coarsened interstitial markings without edema or infiltrate.  No effusion or pneumothorax.  IMPRESSION:  Chronic interstitial changes and cardiomegaly.  No evidence of acute cardiopulmonary disease.   Original Report Authenticated By: Tiburcio Pea   1. Sickle cell crisis     MDM  32yo M presents w/ typical, non-traumatic, sickle-cell pain across ribs and in lower back.  Afebrile, non-toxic appearing, no respiratory distress.  CXR neg for acute chest.   No sig lab findings.    Pt had no relief w/ 1mg  IV dilaudid.  2mg  ordered.  Pisciotta, PA-C to resume care.  5:58 AM   Pt reports relief in pain; 6/10 currently.  Will treat with one more dose and then discharge.  6:41 AM   Otilio Miu, PA-C 09/10/12 [phone]

## 2012-09-10 NOTE — ED Notes (Signed)
Patient transported to CT 

## 2012-09-10 NOTE — ED Notes (Signed)
Pt aware of need for a urine sample. Pt unable to void at this time 

## 2012-09-10 NOTE — ED Provider Notes (Signed)
Medical screening examination/treatment/procedure(s) were performed by non-physician practitioner and as supervising physician I was immediately available for consultation/collaboration.    Lavel Rieman D Konnar Ben, MD 09/10/12 0730 

## 2012-09-10 NOTE — ED Notes (Signed)
Pt put on 2L [place] to help w/ pt's sickle cell crisis.

## 2012-09-10 NOTE — ED Notes (Signed)
Pt complains of back and side pain since 5pm last night

## 2012-09-15 ENCOUNTER — Encounter (HOSPITAL_COMMUNITY): Payer: Self-pay | Admitting: Emergency Medicine

## 2012-09-15 ENCOUNTER — Emergency Department (HOSPITAL_COMMUNITY)
Admission: EM | Admit: 2012-09-15 | Discharge: 2012-09-15 | Disposition: A | Discharge status: Home / Self Care | Payer: Medicare Other | Attending: Emergency Medicine | Admitting: Emergency Medicine

## 2012-09-15 DIAGNOSIS — Z87891 Personal history of nicotine dependence: Secondary | ICD-10-CM | POA: Insufficient documentation

## 2012-09-15 DIAGNOSIS — Z8619 Personal history of other infectious and parasitic diseases: Secondary | ICD-10-CM | POA: Insufficient documentation

## 2012-09-15 DIAGNOSIS — M545 Low back pain, unspecified: Secondary | ICD-10-CM | POA: Insufficient documentation

## 2012-09-15 DIAGNOSIS — Z79899 Other long term (current) drug therapy: Secondary | ICD-10-CM | POA: Insufficient documentation

## 2012-09-15 DIAGNOSIS — Z862 Personal history of diseases of the blood and blood-forming organs and certain disorders involving the immune mechanism: Secondary | ICD-10-CM | POA: Insufficient documentation

## 2012-09-15 DIAGNOSIS — R079 Chest pain, unspecified: Secondary | ICD-10-CM | POA: Insufficient documentation

## 2012-09-15 DIAGNOSIS — Z8679 Personal history of other diseases of the circulatory system: Secondary | ICD-10-CM | POA: Insufficient documentation

## 2012-09-15 DIAGNOSIS — D5700 Hb-SS disease with crisis, unspecified: Secondary | ICD-10-CM | POA: Insufficient documentation

## 2012-09-15 DIAGNOSIS — Z8739 Personal history of other diseases of the musculoskeletal system and connective tissue: Secondary | ICD-10-CM | POA: Insufficient documentation

## 2012-09-15 DIAGNOSIS — Z791 Long term (current) use of non-steroidal anti-inflammatories (NSAID): Secondary | ICD-10-CM | POA: Insufficient documentation

## 2012-09-15 DIAGNOSIS — Z8659 Personal history of other mental and behavioral disorders: Secondary | ICD-10-CM | POA: Insufficient documentation

## 2012-09-15 DIAGNOSIS — Z5189 Encounter for other specified aftercare: Secondary | ICD-10-CM | POA: Insufficient documentation

## 2012-09-15 DIAGNOSIS — Z7901 Long term (current) use of anticoagulants: Secondary | ICD-10-CM | POA: Insufficient documentation

## 2012-09-15 DIAGNOSIS — I10 Essential (primary) hypertension: Secondary | ICD-10-CM | POA: Insufficient documentation

## 2012-09-15 DIAGNOSIS — Z86711 Personal history of pulmonary embolism: Secondary | ICD-10-CM | POA: Diagnosis not present

## 2012-09-15 LAB — CBC WITH DIFFERENTIAL/PLATELET
Basophils Absolute: 0.1 10*3/uL (ref 0.0–0.1)
Eosinophils Absolute: 0.2 10*3/uL (ref 0.0–0.7)
Eosinophils Relative: 2 % (ref 0–5)
HCT: 23.7 % — ABNORMAL LOW (ref 39.0–52.0)
Lymphocytes Relative: 13 % (ref 12–46)
Lymphs Abs: 2.1 10*3/uL (ref 0.7–4.0)
MCH: 32.3 pg (ref 26.0–34.0)
MCV: 93.3 fL (ref 78.0–100.0)
Monocytes Absolute: 2.3 10*3/uL — ABNORMAL HIGH (ref 0.1–1.0)
Platelets: 512 10*3/uL — ABNORMAL HIGH (ref 150–400)
RDW: 18.2 % — ABNORMAL HIGH (ref 11.5–15.5)
WBC: 15.7 10*3/uL — ABNORMAL HIGH (ref 4.0–10.5)

## 2012-09-15 LAB — COMPREHENSIVE METABOLIC PANEL
ALT: 18 U/L (ref 0–53)
Alkaline Phosphatase: 75 U/L (ref 39–117)
BUN: 5 mg/dL — ABNORMAL LOW (ref 6–23)
CO2: 24 mEq/L (ref 19–32)
GFR calc Af Amer: >90 mL/min (ref >90)
GFR calc non Af Amer: >90 mL/min (ref >90)
Glucose, Bld: 96 mg/dL (ref 70–99)
Potassium: 3.5 mEq/L (ref 3.5–5.1)
Sodium: 136 mEq/L (ref 135–145)
Total Bilirubin: 8 mg/dL — ABNORMAL HIGH (ref 0.3–1.2)

## 2012-09-15 LAB — RETICULOCYTES: Retic Ct Pct: 7.7 % — ABNORMAL HIGH (ref 0.4–3.1)

## 2012-09-15 MED ORDER — HYDROMORPHONE HCL PF 1 MG/ML IJ SOLN
1.0000 mg | INTRAMUSCULAR | Status: DC
  Administered 2012-09-15: 1 mg via INTRAVENOUS

## 2012-09-15 MED ORDER — SODIUM CHLORIDE 0.9 % IV BOLUS (SEPSIS): 1000.0000 mL | Freq: Once | INTRAVENOUS | Status: DC

## 2012-09-15 MED ORDER — SODIUM CHLORIDE 0.9 % IV BOLUS (SEPSIS)
2000.0000 mL | Freq: Once | INTRAVENOUS | Status: AC
  Administered 2012-09-15: 1000 mL via INTRAVENOUS

## 2012-09-15 MED ORDER — HYDROMORPHONE HCL PF 1 MG/ML IJ SOLN
1.0000 mg | INTRAMUSCULAR | Status: AC | PRN
  Administered 2012-09-15: 1 mg via INTRAVENOUS
  Filled 2012-09-15: qty 1

## 2012-09-15 MED ORDER — DIPHENHYDRAMINE HCL 50 MG/ML IJ SOLN
25.0000 mg | Freq: Once | INTRAMUSCULAR | Status: AC
  Administered 2012-09-15: 25 mg via INTRAVENOUS
  Filled 2012-09-15: qty 1

## 2012-09-15 MED ORDER — HYDROMORPHONE HCL PF 1 MG/ML IJ SOLN
1.0000 mg | INTRAMUSCULAR | Status: DC | PRN
  Administered 2012-09-15: 1 mg via INTRAVENOUS
  Filled 2012-09-15 (×2): qty 1

## 2012-09-15 MED ORDER — HEPARIN SOD (PORK) LOCK FLUSH 100 UNIT/ML IV SOLN
INTRAVENOUS | Status: AC
  Administered 2012-09-15: 500 [IU]
  Filled 2012-09-15: qty 5

## 2012-09-15 NOTE — ED Notes (Signed)
Out of power port start kits. Ordering more.

## 2012-09-15 NOTE — ED Provider Notes (Signed)
TIME SEEN: 12:18 PM  CHIEF COMPLAINT: Sickle cell crisis  HPI: Patient is a 32 year old with a history of sickle cell anemia, pulmonary embolus currently on Xarelto who presents to the emergency department with bilateral rib pain and lower back pain that is typical of his prior sickle cell exacerbations. He states his last transfusion was several months ago. He has not missed any doses of his Xarelto.  He denies that this feels like his prior pulmonary embolus. He denies any chest pain, shortness breath, fever, cough, lower extreme swelling or pain. No recent infectious symptoms.  He has not run out of his pain medication at home.  PCP Marthann Schiller  ROS: See HPI Constitutional: no fever  Eyes: no drainage  ENT: no runny nose   Cardiovascular:  no chest pain  Resp: no SOB  GI: no vomiting GU: no dysuria Integumentary: no rash  Allergy: no hives  Musculoskeletal: no leg swelling  Neurological: no slurred speech ROS otherwise negative  PAST MEDICAL HISTORY/PAST SURGICAL HISTORY:  Past Medical History  Diagnosis Date  . Sickle cell anemia   . Blood transfusion   . 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 Hypokalemia   . Mood disorder   . Pulmonary embolism   . Avascular necrosis   . Leukocytosis     Chronic  . Thrombocytosis     Chronic  . Hypertension   . C. difficile colitis     MEDICATIONS:  Prior to Admission medications   Medication Sig Start Date End Date Taking? Authorizing Provider  celecoxib (CELEBREX) 200 MG capsule Take 1 capsule (200 mg total) by mouth 2 (two) times daily as needed for pain. 07/31/12   Altha Harm, MD  folic acid (FOLVITE) 1 MG tablet Take 1 tablet (1 mg total) by mouth every morning. 06/05/12   Altha Harm, MD  HYDROmorphone (DILAUDID) 4 MG tablet Take 1 tablet (4 mg total) by mouth every 4 (four) hours as needed for pain. 08/27/12   Altha Harm, MD  hydroxyurea (HYDREA) 500 MG capsule Take 2  capsules (1,000 mg total) by mouth daily. May take with food to minimize GI side effects. 07/12/12   Altha Harm, MD  morphine (MS CONTIN) 15 MG 12 hr tablet Take 1 tablet (15 mg total) by mouth 2 (two) times daily. 08/27/12   Altha Harm, MD  potassium chloride SA (K-DUR,KLOR-CON) 20 MEQ tablet Take 1 tablet (20 mEq total) by mouth every morning. 07/31/12   Altha Harm, MD  Rivaroxaban (XARELTO) 20 MG TABS Take 20 mg by mouth every morning.     Historical Provider, MD  zolpidem (AMBIEN) 10 MG tablet Take 1 tablet (10 mg total) by mouth at bedtime as needed for sleep. For sleep 08/14/12   Grayce Sessions, NP    ALLERGIES:  Allergies  Allergen Reactions  . Morphine And Related Hives and Rash    Pt states he can take the pill form, but not IV and he is able to tolerate Dilaudid with no reactions.    SOCIAL HISTORY:  History  Substance Use Topics  . Smoking status: Former Smoker -- 13 years    Types: Cigarettes    Quit date: 07/08/2010  . Smokeless tobacco: Never Used  . Alcohol Use: No    FAMILY HISTORY: Family History  Problem Relation Age of Onset  . Sickle cell anemia Mother   . Sickle cell anemia Father   . Sickle cell trait  Brother     EXAM: BP 124/74  Pulse 76  Temp(Src) 98.5 F (36.9 C) (Oral)  Resp 20  SpO2 99% CONSTITUTIONAL: Alert and oriented and responds appropriately to questions. Well-appearing; well-nourished, appears uncomfortable HEAD: Normocephalic EYES: Conjunctivae clear, PERRL ENT: normal nose; no rhinorrhea; moist mucous membranes; pharynx without lesions noted NECK: Supple, no meningismus, no LAD  CARD: RRR; S1 and S2 appreciated; no murmurs, no clicks, no rubs, no gallops RESP: Normal chest excursion without splinting or tachypnea; breath sounds clear and equal bilaterally; no wheezes, no rhonchi, no rales,  ABD/GI: Normal bowel sounds; non-distended; soft, non-tender, no rebound, no guarding BACK:  The back appears normal and  is non-tender to palpation, there is no CVA tenderness EXT: Normal ROM in all joints; non-tender to palpation; no edema; normal capillary refill; no cyanosis    SKIN: Normal color for age and race; warm NEURO: Moves all extremities equally PSYCH: The patient's mood and manner are appropriate. Grooming and personal hygiene are appropriate.  MEDICAL DECISION MAKING: Patient with typical sickle cell pain crisis.  Will check labs, urine.  We'll give pain medication, IV fluids, oxygen and reassess.  No concern for PE as vital signs are stable, patient denies chest pain or shortness of breath and is currently on treatment for his pulmonary embolus.  ED PROGRESS:  Pt is controlled after 3 rounds of Dilaudid. He is hemodynamically stable. Patient does have a leukocytosis but this is likely due to his sickle cell crisis. No sign of infection. He denies any fever, cough, chest pain or shortness of breath. Hemoglobin is at baseline.  He reports this plan medication at home and feels he can control his pain at home with oral medications. He is ready for discharge. Given customary in usual return precautions. Patient verbalizes understanding and is comfortable with plan.  Layla Maw Makell Drohan, DO 09/15/12 1514

## 2012-09-15 NOTE — ED Notes (Addendum)
Patient with Hx of sickle cell reports to ED for sickle cell pain flare up, with 7/10 pain in his back and sides. Denies SOB, denies CP.

## 2012-09-17 ENCOUNTER — Encounter (HOSPITAL_COMMUNITY): Payer: Self-pay

## 2012-09-17 ENCOUNTER — Telehealth: Payer: Self-pay | Admitting: Internal Medicine

## 2012-09-17 ENCOUNTER — Non-Acute Institutional Stay (HOSPITAL_COMMUNITY): Payer: Medicare Other

## 2012-09-17 ENCOUNTER — Non-Acute Institutional Stay (HOSPITAL_COMMUNITY)
Admission: AD | Admit: 2012-09-17 | Discharge: 2012-09-17 | Disposition: A | Discharge status: Home / Self Care | Payer: Medicare Other | Attending: Internal Medicine | Admitting: Internal Medicine

## 2012-09-17 DIAGNOSIS — D72829 Elevated white blood cell count, unspecified: Secondary | ICD-10-CM | POA: Diagnosis not present

## 2012-09-17 DIAGNOSIS — R0602 Shortness of breath: Secondary | ICD-10-CM | POA: Diagnosis not present

## 2012-09-17 DIAGNOSIS — Z86711 Personal history of pulmonary embolism: Secondary | ICD-10-CM | POA: Diagnosis not present

## 2012-09-17 DIAGNOSIS — Z79899 Other long term (current) drug therapy: Secondary | ICD-10-CM | POA: Diagnosis not present

## 2012-09-17 DIAGNOSIS — D5700 Hb-SS disease with crisis, unspecified: Secondary | ICD-10-CM | POA: Insufficient documentation

## 2012-09-17 DIAGNOSIS — F39 Unspecified mood [affective] disorder: Secondary | ICD-10-CM | POA: Diagnosis not present

## 2012-09-17 LAB — CBC WITH DIFFERENTIAL/PLATELET
Basophils Absolute: 0.2 10*3/uL — ABNORMAL HIGH (ref 0.0–0.1)
Basophils Relative: 1 % (ref 0–1)
Lymphocytes Relative: 13 % (ref 12–46)
MCHC: 34.9 g/dL (ref 30.0–36.0)
Monocytes Absolute: 2 10*3/uL — ABNORMAL HIGH (ref 0.1–1.0)
Neutro Abs: 12.4 10*3/uL — ABNORMAL HIGH (ref 1.7–7.7)
Neutrophils Relative %: 73 % (ref 43–77)
Platelets: 529 10*3/uL — ABNORMAL HIGH (ref 150–400)
RDW: 18.5 % — ABNORMAL HIGH (ref 11.5–15.5)
WBC: 17 10*3/uL — ABNORMAL HIGH (ref 4.0–10.5)

## 2012-09-17 LAB — COMPREHENSIVE METABOLIC PANEL
AST: 38 U/L — ABNORMAL HIGH (ref 0–37)
CO2: 23 mEq/L (ref 19–32)
Calcium: 8.8 mg/dL (ref 8.4–10.5)
Creatinine, Ser: 0.56 mg/dL (ref 0.50–1.35)
GFR calc non Af Amer: >90 mL/min (ref >90)
Total Protein: 7.1 g/dL (ref 6.0–8.3)

## 2012-09-17 LAB — RETICULOCYTES
RBC.: 2.51 MIL/uL — ABNORMAL LOW (ref 4.22–5.81)
Retic Count, Absolute: 238.5 10*3/uL — ABNORMAL HIGH (ref 19.0–186.0)
Retic Ct Pct: 9.5 % — ABNORMAL HIGH (ref 0.4–3.1)

## 2012-09-17 MED ORDER — HEPARIN SOD (PORK) LOCK FLUSH 100 UNIT/ML IV SOLN
500.0000 [IU] | INTRAVENOUS | Status: AC | PRN
  Administered 2012-09-17: 500 [IU]
  Filled 2012-09-17: qty 5

## 2012-09-17 MED ORDER — DIPHENHYDRAMINE HCL 25 MG PO CAPS
25.0000 mg | ORAL_CAPSULE | ORAL | Status: DC | PRN
  Administered 2012-09-17: 25 mg via ORAL
  Filled 2012-09-17: qty 1

## 2012-09-17 MED ORDER — DEXTROSE-NACL 5-0.45 % IV SOLN
INTRAVENOUS | Status: DC
  Administered 2012-09-17: 09:00:00 via INTRAVENOUS

## 2012-09-17 MED ORDER — ONDANSETRON HCL 4 MG/2ML IJ SOLN: 4.0000 mg | INTRAMUSCULAR | Status: DC | PRN

## 2012-09-17 MED ORDER — SODIUM CHLORIDE 0.9 % IV SOLN
25.0000 mg | Freq: Four times a day (QID) | INTRAVENOUS | Status: DC | PRN
  Filled 2012-09-17: qty 0.5

## 2012-09-17 MED ORDER — SODIUM CHLORIDE 0.9 % IJ SOLN
10.0000 mL | INTRAMUSCULAR | Status: AC | PRN
  Administered 2012-09-17: 10 mL

## 2012-09-17 MED ORDER — POTASSIUM CHLORIDE CRYS ER 20 MEQ PO TBCR
20.0000 meq | EXTENDED_RELEASE_TABLET | Freq: Every morning | ORAL | Status: DC
  Administered 2012-09-17: 20 meq via ORAL
  Filled 2012-09-17: qty 1

## 2012-09-17 MED ORDER — HYDROMORPHONE HCL PF 2 MG/ML IJ SOLN
4.0000 mg | INTRAMUSCULAR | Status: DC | PRN
  Administered 2012-09-17 (×4): 4 mg via INTRAVENOUS
  Filled 2012-09-17 (×4): qty 2

## 2012-09-17 MED ORDER — ONDANSETRON HCL 4 MG PO TABS: 4.0000 mg | ORAL_TABLET | ORAL | Status: DC | PRN

## 2012-09-17 MED ORDER — HYDROMORPHONE HCL PF 2 MG/ML IJ SOLN
4.0000 mg | INTRAMUSCULAR | Status: DC | PRN
  Administered 2012-09-17: 4 mg via INTRAVENOUS
  Filled 2012-09-17: qty 2

## 2012-09-17 MED ORDER — MORPHINE SULFATE ER 15 MG PO TBCR: 15.0000 mg | EXTENDED_RELEASE_TABLET | Freq: Two times a day (BID) | ORAL | Status: DC

## 2012-09-17 MED ORDER — DIPHENHYDRAMINE HCL 25 MG PO CAPS: 25.0000 mg | ORAL_CAPSULE | Freq: Four times a day (QID) | ORAL | Status: DC | PRN

## 2012-09-17 MED ORDER — KETOROLAC TROMETHAMINE 30 MG/ML IJ SOLN
30.0000 mg | Freq: Four times a day (QID) | INTRAMUSCULAR | Status: DC
  Administered 2012-09-17 (×2): 30 mg via INTRAVENOUS
  Filled 2012-09-17 (×2): qty 1

## 2012-09-17 MED ORDER — SODIUM CHLORIDE 0.9 % IV SOLN
25.0000 mg | INTRAVENOUS | Status: DC | PRN
  Filled 2012-09-17: qty 0.5

## 2012-09-17 MED ORDER — FOLIC ACID 1 MG PO TABS
1.0000 mg | ORAL_TABLET | Freq: Every day | ORAL | Status: DC
  Administered 2012-09-17: 1 mg via ORAL
  Filled 2012-09-17: qty 1

## 2012-09-17 MED ORDER — DEXTROSE-NACL 5-0.45 % IV SOLN: INTRAVENOUS | Status: DC

## 2012-09-17 MED ORDER — RIVAROXABAN 20 MG PO TABS
20.0000 mg | ORAL_TABLET | Freq: Every day | ORAL | Status: DC
  Administered 2012-09-17: 20 mg via ORAL
  Filled 2012-09-17 (×2): qty 1

## 2012-09-17 NOTE — Discharge Summary (Signed)
[facility] Discharge Summary   Patient ID: [name] MRN: [id number] DOB/AGE: 33-Apr-1982 33 y.o.  Admit date: 09/17/2012 Discharge date: 09/17/2012  Primary Care Physician:  [name],Gerald A., MD  Admission Diagnoses:  Active Problems:    Vaso occlusive crisis with active hemolysis     Mood disorder      Pulmonary embolism      Leukocytosis      Discharge Diagnoses:      Vaso occlusive crisis with active hemolysis     Mood disorder      Pulmonary embolism      Leukocytosis     Discharge Medications:    Medication List    ASK your doctor about these medications       celecoxib 200 MG capsule  Commonly known as:  CELEBREX  Take 1 capsule (200 mg total) by mouth 2 (two) times daily as needed for pain.     folic acid 1 MG tablet  Commonly known as:  FOLVITE  Take 1 tablet (1 mg total) by mouth every morning.     HYDROmorphone 4 MG tablet  Commonly known as:  DILAUDID  Take 1 tablet (4 mg total) by mouth every 4 (four) hours as needed for pain.     hydroxyurea 500 MG capsule  Commonly known as:  HYDREA  Take 2 capsules (1,000 mg total) by mouth daily. May take with food to minimize GI side effects.     morphine 15 MG 12 hr tablet  Commonly known as:  MS CONTIN  Take 1 tablet (15 mg total) by mouth 2 (two) times daily.     potassium chloride SA 20 MEQ tablet  Commonly known as:  K-DUR,KLOR-CON  Take 1 tablet (20 mEq total) by mouth every morning.     XARELTO 20 MG Tabs tablet  Generic drug:  Rivaroxaban  Take 20 mg by mouth every morning.     zolpidem 10 MG tablet  Commonly known as:  AMBIEN  Take 1 tablet (10 mg total) by mouth at bedtime as needed for sleep. For sleep         Consults:   None  Significant Diagnostic Studies:  Dg Chest 2 View  09/17/2012   *RADIOLOGY REPORT*  Clinical Data: Shortness of breath  CHEST - 2 VIEW  Comparison: 09/10/2012  Findings: There is a left chest wall porta-catheter with tip in the  cavoatrial junction.  Heart size appears mildly enlarged.  No pleural effusion or edema identified.  No airspace consolidation noted.  Mild spondylosis identified within the thoracic spine.  IMPRESSION:  1.  No acute cardiopulmonary abnormalities.   Original Report Authenticated By: Signa Kell, M.D.   Dg Chest 2 View  09/10/2012   *RADIOLOGY REPORT*  Clinical Data: Sickle cell pain crisis.  CHEST - 2 VIEW  Comparison: 08/18/2012.  Findings: Unchanged appearance of left subclavian portacatheter, tip at the superior cavoatrial junction.  Stable osseous findings, including bilateral proximal humeral osteonecrosis.  Chronic cardiomegaly.  Unremarkable upper mediastinal contours.  Coarsened interstitial markings without edema or infiltrate.  No effusion or pneumothorax.  IMPRESSION:  Chronic interstitial changes and cardiomegaly.  No evidence of acute cardiopulmonary disease.   Original Report Authenticated By: Tiburcio Pea   Ct Angio Chest Pe W/cm &/or Wo Cm  08/27/2012   **ADDENDUM** CREATED: 08/27/2012 17:11:37  Comment:  I communicated this report directly by phone to Dr. Ashley Royalty at the [facility], August 27, 2012, 05:11 p.m.  **END ADDENDUM** SIGNED BY: Chrissie Noa  Calton Dach, M.D.  08/27/2012   *RADIOLOGY REPORT*  Clinical Data: Shortness of breath  CT ANGIOGRAPHY CHEST  Technique:  Multidetector CT imaging of the chest using the standard protocol during bolus administration of intravenous contrast. Multiplanar reconstructed images including MIPs were obtained and reviewed to evaluate the vascular anatomy.  Contrast: OMNIPAQUE IOHEXOL 350 MG/ML SOLN  Comparison:  Chest radiograph August 18, 2012 and chest CT angiogram March 01, 2012  Findings: There is a pulmonary embolus in a peripheral branch of the superior   right lower lobe superior segment pulmonary artery. There is no thoracic aortic aneurysm or dissection.  There is patchy infiltrate in the anterior and lateral segments of the left  lower lobe.  There is also patchy infiltrate in the lateral segment right lower lobe.  There is patchy bibasilar atelectasis.  There is also mild atelectatic change in the posterior segment right upper lobe.  There is mild scarring in the anterior segment left upper lobe medially.  There is a small area of infiltrate in the anterior segment left upper lobe on slice 27.  There are multiple lymph nodes throughout the mediastinum, largest adjacent to the aortic arch.  The largest of these lymph nodes measures 1 x 0.9 cm.  By size criteria, there is no frank adenopathy.  Port-A-Cath tip is at the cavoatrial junction.  Heart is enlarged.  Pericardium is not thickened.  Visualized upper abdominal structures appear normal except for diffuse calcification in the spleen.  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  IMPRESSION: Small pulmonary embolus in a branch of the superior segment right lower lobe pulmonary artery.  No other pulmonary emboli identified.  Bony changes of sickle cell disease.  Splenic changes of sickle cell disease.  Areas of patchy infiltrate bilaterally.  Stable small mediastinal lymph nodes which are stable.   Original Report Authenticated By: Bretta Bang, M.D.     [facility] Course:  For complete details please refer to admission H and P, but in brief, [name] is a 32 year old African American Male with SS genotype SCD. He has a PMH of pulmonary embolism ,mood disorder and hypokalemia. Pain is non radiating dull achy and consistent pain in his lower back and bilateral rib pain. Treatment included :  Vaso occlusive crisis: he was gentle ly  hydration with D5 1/2 NS at 125 cc/hr, prn IV analgesics.Adjunct therapy  Toradol and received one dose 30 mg IV Q 6hrs.. Long acting analgesic and maintenance medications resumed ie Ms contin, folic acid and hydroxurea  Leucocytosis: probable secondary to  inflammatory reactive process no s/s of infection. Toradol added  m Hx of Pulmonary emboli: cont xareto   Physical Exam at Discharge:  BP 110/61  Pulse 63  Temp(Src) 98.5 F (36.9 C) (Oral)  Resp 14  SpO2 98%  WUJ:WJXBJ, cooperative, no distress, appears stated age   Cardiovascular:Regular rate and rhythm, S1 and S2 normal, no murmur, rub or gallop Porta cath RUQ  Respiratory:CTA anterior and posterior no vocal fremitus present  Gastrointestinal: soft non tender bowels sounds present in all 4 quadrants  Extremities:CNII-XII intact. Normal strength    Disposition at Discharge: 01-Home or Self Care  Discharge Orders:   Condition at Discharge:   Stable  Time spent on Discharge:  Greater than 30 minutes.  SignedGrayce Sessions 09/17/2012, 5:45 PM

## 2012-09-17 NOTE — Progress Notes (Signed)
Patient discharged home. VSS. Patient in no apparent distress. Discharge instructions reviewed, patient acknowledges. Patient to call to Primary Care Office during business hours tomorrow to schedule follow up visit. Patient left ambulatory with belongings via self.

## 2012-09-17 NOTE — H&P (Signed)
[facility] History and Physical   Date: 09/17/2012  Patient name: [name] Medical record number: [id number] Date of birth: November 03, 1980 Age: 32 y.o. Gender: male PCP: [name],Jakoby Melendrez A., MD  Attending physician: Altha Harm, MD  Chief Complaint: Bilateral rib pain and lower back   History of Present Illness: Mr Demeo is a 32 year old African American Male with SS genotype SCD. He has a  PMH of pulmonary embolism , mood disorder and hypokalemia. He was recently hospitalized July 28- September 02, 2012 for acute PE and HAP. He has been experience increase pain since Saturday when he went to the ED for treatment and d/c to home since than he hasn't felt any better. Pain is non radiating dull achy and consistent pain in his lower back and bilateral rib pain. He rates his pain 8/10.  He denies shortness of breath, chest pain, dizziness, headaches, N/V/D, dysuria and priapism, He was admitted to the [facility] for acute management of symptoms.  He is unable to identify any palliative or provocative features.    Meds: Prescriptions prior to admission  Medication Sig Dispense Refill  . celecoxib (CELEBREX) 200 MG capsule Take 1 capsule (200 mg total) by mouth 2 (two) times daily as needed for pain.  30 capsule  0  . folic acid (FOLVITE) 1 MG tablet Take 1 tablet (1 mg total) by mouth every morning.  30 tablet  11  . HYDROmorphone (DILAUDID) 4 MG tablet Take 1 tablet (4 mg total) by mouth every 4 (four) hours as needed for pain.  90 tablet  0  . hydroxyurea (HYDREA) 500 MG capsule Take 2 capsules (1,000 mg total) by mouth daily. May take with food to minimize GI side effects.  60 capsule  1  . morphine (MS CONTIN) 15 MG 12 hr tablet Take 1 tablet (15 mg total) by mouth 2 (two) times daily.  60 tablet  0  . potassium chloride SA (K-DUR,KLOR-CON) 20 MEQ tablet Take 1 tablet (20 mEq total) by mouth every morning.  30 tablet  3  . Rivaroxaban (XARELTO) 20 MG TABS Take 20 mg by  mouth every morning.       . zolpidem (AMBIEN) 10 MG tablet Take 1 tablet (10 mg total) by mouth at bedtime as needed for sleep. For sleep  30 tablet  1    Allergies: Morphine and related Past Medical History  Diagnosis Date  . Sickle cell anemia   . Blood transfusion   . 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 Hypokalemia   . Mood disorder   . Pulmonary embolism   . Avascular necrosis   . Leukocytosis     Chronic  . Thrombocytosis     Chronic  . Hypertension   . C. difficile colitis    Past Surgical History  Procedure Laterality Date  . Right hip replacement      08/2006  . Cholecystectomy      01/2008  . Porta cath placement    . Porta cath removal    . Umbilical hernia repair      01/2008  . Excision of left periauricular cyst      10/2009  . Excision of right ear lobe cyst with primary closur      11/2007  . Portacath placement  01/05/2012    Procedure: INSERTION PORT-A-CATH;  Surgeon: Adolph Pollack, MD;  Location: [facility] OR;  Service: General;  Laterality: N/A;  ultrasound guiced port  a cath insertion with fluoroscopy   Family History  Problem Relation Age of Onset  . Sickle cell anemia Mother   . Sickle cell anemia Father   . Sickle cell trait Brother    History   Social History  . Marital Status: Single    Spouse Name: N/A    Number of Children: 0  . Years of Education: 13   Occupational History  . Unemployed    Social History Main Topics  . Smoking status: Former Smoker -- 13 years    Types: Cigarettes    Quit date: 07/08/2010  . Smokeless tobacco: Never Used  . Alcohol Use: No  . Drug Use: No     Comment: quit smoking 2011  . Sexual Activity: Yes    Partners: Female    Pharmacist, hospital Protection: None   Other Topics Concern  . Not on file   Social History Narrative   Lives in an apartment.  Single.  Lives with girlfriend.  Does not use any assist devices.        [name]:  [phone] Mom, emergency  contact    Review of Systems: Review of Systems  Constitutional: Negative.   HENT: Negative.   Eyes: Negative.   Respiratory: Negative.   Cardiovascular: Negative.   Gastrointestinal: Negative.   Genitourinary: Negative.   Musculoskeletal: Positive for back pain.       Bilateral rib pain   Skin: Negative.   Neurological: Negative.   Endo/Heme/Allergies: Negative.   Psychiatric/Behavioral: Negative.     Physical Exam: Blood pressure 120/89, pulse 87, temperature 98.7 F (37.1 C), temperature source Oral, resp. rate 20, SpO2 100.00%. BP 111/64  Pulse 60  Temp(Src) 98.7 F (37.1 C) (Oral)  Resp 18  SpO2 100%  General Appearance:    Alert, cooperative, no distress, appears stated age  Head:    Normocephalic, without obvious abnormality, atraumatic  Eyes:    PERRL, conjunctiva/corneas clear, EOM's intact, moderate icteric sclera   Ears:    Normal TM's and external ear canals, both ears, right cerumen impaction   Nose:   Nares normal, septum midline, mucosa normal, no drainage    or sinus tenderness  Throat:   Lips, mucosa, and tongue normal; teeth and gums normal  Neck:   Supple, symmetrical, trachea midline, no adenopathy;       thyroid:  No enlargement/tenderness/nodules; no carotid   bruit or JVD  Back:     Symmetric, no curvature, ROM normal,  Positive CVA tenderness  Lungs:     Clear to auscultation bilaterally, respirations unlabored, no vocal fremitus   Chest wall:  Porta cath RUQ   Heart:    Regular rate and rhythm, S1 and S2 normal, no murmur, rub   or gallop  Abdomen:     Soft, non-tender, bowel sounds active all four quadrants,    no masses, no organomegaly  Genitalia:  Deferred denies priapism   Rectal:  Deferred   Extremities:   Extremities normal, atraumatic, no cyanosis or edema  Pulses:   2+ and symmetric all extremities  Skin:   Skin color, texture, turgor normal, no rashes or lesions, multiple tattoos   Lymph nodes:    No Cervical, supraclavicular  lymphopathy   Neurologic:   CNII-XII intact. Normal strength    Lab results: Results for orders placed during the hospital encounter of 09/17/12 (from the past 24 hour(s))  CBC WITH DIFFERENTIAL     Status: Abnormal   Collection Time    09/17/12  9:28 AM      Result Value Range   WBC 17.0 (*) 4.0 - 10.5 K/uL   RBC 2.51 (*) 4.22 - 5.81 MIL/uL   Hemoglobin 8.2 (*) 13.0 - 17.0 g/dL   HCT 16.1 (*) 09.6 - 04.5 %   MCV 93.6  78.0 - 100.0 fL   MCH 32.7  26.0 - 34.0 pg   MCHC 34.9  30.0 - 36.0 g/dL   RDW 40.9 (*) 81.1 - 91.4 %   Platelets 529 (*) 150 - 400 K/uL   Neutrophils Relative % 73  43 - 77 %   Neutro Abs 12.4 (*) 1.7 - 7.7 K/uL   Lymphocytes Relative 13  12 - 46 %   Lymphs Abs 2.2  0.7 - 4.0 K/uL   Monocytes Relative 12  3 - 12 %   Monocytes Absolute 2.0 (*) 0.1 - 1.0 K/uL   Eosinophils Relative 2  0 - 5 %   Eosinophils Absolute 0.3  0.0 - 0.7 K/uL   Basophils Relative 1  0 - 1 %   Basophils Absolute 0.2 (*) 0.0 - 0.1 K/uL  RETICULOCYTES     Status: Abnormal   Collection Time    09/17/12  9:28 AM      Result Value Range   Retic Ct Pct 9.5 (*) 0.4 - 3.1 %   RBC. 2.51 (*) 4.22 - 5.81 MIL/uL   Retic Count, Manual 238.5 (*) 19.0 - 186.0 K/uL  COMPREHENSIVE METABOLIC PANEL     Status: Abnormal   Collection Time    09/17/12  9:28 AM      Result Value Range   Sodium 137  135 - 145 mEq/L   Potassium 3.7  3.5 - 5.1 mEq/L   Chloride 105  96 - 112 mEq/L   CO2 23  19 - 32 mEq/L   Glucose, Bld 112 (*) 70 - 99 mg/dL   BUN 6  6 - 23 mg/dL   Creatinine, Ser 7.82  0.50 - 1.35 mg/dL   Calcium 8.8  8.4 - 95.6 mg/dL   Total Protein 7.1  6.0 - 8.3 g/dL   Albumin 3.8  3.5 - 5.2 g/dL   AST 38 (*) 0 - 37 U/L   ALT 23  0 - 53 U/L   Alkaline Phosphatase 86  39 - 117 U/L   Total Bilirubin 8.1 (*) 0.3 - 1.2 mg/dL   GFR calc non Af Amer >90  >90 mL/min   GFR calc Af Amer >90  >90 mL/min    Imaging results:  Dg Chest 2 View  09/17/2012   *RADIOLOGY REPORT*  Clinical Data: Shortness of  breath  CHEST - 2 VIEW  Comparison: 09/10/2012  Findings: There is a left chest wall porta-catheter with tip in the cavoatrial junction.  Heart size appears mildly enlarged.  No pleural effusion or edema identified.  No airspace consolidation noted.  Mild spondylosis identified within the thoracic spine.  IMPRESSION:  1.  No acute cardiopulmonary abnormalities.   Original Report Authenticated By: Signa Kell, M.D.     Assessment & Plan: 1.Vaso occlusive crisis with hemolysis: probable due to barometric changes  and decreased oxygen carrying capacity. Treatment /plan is gently hydrate and place on scheduled analgesic than prn . Resume home meds hydrea and long acting analgesic.  2. 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 Adjunct therapy Toradol 30mg  Q 6hrs and followed  3. Hx of  Pulmonary emboli. Cont Xarelto    Keneshia Tena P 09/17/2012, 12:52 PM

## 2012-09-18 ENCOUNTER — Non-Acute Institutional Stay (HOSPITAL_COMMUNITY)
Admission: AD | Admit: 2012-09-18 | Discharge: 2012-09-18 | Disposition: A | Discharge status: Home / Self Care | Payer: Medicare Other | Attending: Internal Medicine | Admitting: Internal Medicine

## 2012-09-18 ENCOUNTER — Encounter (HOSPITAL_COMMUNITY): Payer: Self-pay | Admitting: *Deleted

## 2012-09-18 ENCOUNTER — Telehealth (HOSPITAL_COMMUNITY): Payer: Self-pay | Admitting: *Deleted

## 2012-09-18 DIAGNOSIS — Z96649 Presence of unspecified artificial hip joint: Secondary | ICD-10-CM | POA: Diagnosis not present

## 2012-09-18 DIAGNOSIS — D5700 Hb-SS disease with crisis, unspecified: Secondary | ICD-10-CM

## 2012-09-18 DIAGNOSIS — G8929 Other chronic pain: Secondary | ICD-10-CM | POA: Insufficient documentation

## 2012-09-18 DIAGNOSIS — K5909 Other constipation: Secondary | ICD-10-CM | POA: Insufficient documentation

## 2012-09-18 DIAGNOSIS — Z86711 Personal history of pulmonary embolism: Secondary | ICD-10-CM | POA: Insufficient documentation

## 2012-09-18 DIAGNOSIS — E876 Hypokalemia: Secondary | ICD-10-CM | POA: Insufficient documentation

## 2012-09-18 DIAGNOSIS — R52 Pain, unspecified: Secondary | ICD-10-CM | POA: Insufficient documentation

## 2012-09-18 DIAGNOSIS — Z7901 Long term (current) use of anticoagulants: Secondary | ICD-10-CM | POA: Insufficient documentation

## 2012-09-18 DIAGNOSIS — D72829 Elevated white blood cell count, unspecified: Secondary | ICD-10-CM | POA: Insufficient documentation

## 2012-09-18 DIAGNOSIS — Z79899 Other long term (current) drug therapy: Secondary | ICD-10-CM | POA: Insufficient documentation

## 2012-09-18 DIAGNOSIS — F39 Unspecified mood [affective] disorder: Secondary | ICD-10-CM | POA: Insufficient documentation

## 2012-09-18 DIAGNOSIS — T4275XA Adverse effect of unspecified antiepileptic and sedative-hypnotic drugs, initial encounter: Secondary | ICD-10-CM | POA: Insufficient documentation

## 2012-09-18 DIAGNOSIS — I2699 Other pulmonary embolism without acute cor pulmonale: Secondary | ICD-10-CM | POA: Diagnosis not present

## 2012-09-18 DIAGNOSIS — I10 Essential (primary) hypertension: Secondary | ICD-10-CM | POA: Insufficient documentation

## 2012-09-18 LAB — CBC WITH DIFFERENTIAL/PLATELET
Basophils Relative: 1 % (ref 0–1)
Eosinophils Absolute: 0.5 10*3/uL (ref 0.0–0.7)
Eosinophils Relative: 3 % (ref 0–5)
MCH: 32.4 pg (ref 26.0–34.0)
MCHC: 34.3 g/dL (ref 30.0–36.0)
MCV: 94.4 fL (ref 78.0–100.0)
Neutrophils Relative %: 69 % (ref 43–77)
Platelets: 504 10*3/uL — ABNORMAL HIGH (ref 150–400)
RDW: 18.6 % — ABNORMAL HIGH (ref 11.5–15.5)

## 2012-09-18 LAB — COMPREHENSIVE METABOLIC PANEL
ALT: 24 U/L (ref 0–53)
AST: 35 U/L (ref 0–37)
Albumin: 3.7 g/dL (ref 3.5–5.2)
Alkaline Phosphatase: 82 U/L (ref 39–117)
CO2: 24 mEq/L (ref 19–32)
Chloride: 105 mEq/L (ref 96–112)
Creatinine, Ser: 0.5 mg/dL (ref 0.50–1.35)
GFR calc non Af Amer: >90 mL/min (ref >90)
Potassium: 3.8 mEq/L (ref 3.5–5.1)
Sodium: 137 mEq/L (ref 135–145)
Total Bilirubin: 6.7 mg/dL — ABNORMAL HIGH (ref 0.3–1.2)

## 2012-09-18 LAB — RETICULOCYTES
RBC.: 2.5 MIL/uL — ABNORMAL LOW (ref 4.22–5.81)
Retic Ct Pct: 12.4 % — ABNORMAL HIGH (ref 0.4–3.1)

## 2012-09-18 MED ORDER — MORPHINE SULFATE ER 15 MG PO TBCR
15.0000 mg | EXTENDED_RELEASE_TABLET | Freq: Two times a day (BID) | ORAL | Status: DC
  Administered 2012-09-18: 15 mg via ORAL

## 2012-09-18 MED ORDER — HYDROMORPHONE HCL PF 2 MG/ML IJ SOLN
4.0000 mg | INTRAMUSCULAR | Status: AC
  Administered 2012-09-18 (×3): 4 mg via INTRAVENOUS
  Filled 2012-09-18 (×2): qty 2

## 2012-09-18 MED ORDER — DIPHENHYDRAMINE HCL 50 MG/ML IJ SOLN: 12.5000 mg | INTRAMUSCULAR | Status: DC | PRN

## 2012-09-18 MED ORDER — DIPHENHYDRAMINE HCL 25 MG PO CAPS: 25.0000 mg | ORAL_CAPSULE | ORAL | Status: DC | PRN

## 2012-09-18 MED ORDER — POTASSIUM CHLORIDE CRYS ER 20 MEQ PO TBCR: 20.0000 meq | EXTENDED_RELEASE_TABLET | Freq: Every morning | ORAL | Status: DC

## 2012-09-18 MED ORDER — HYDROXYUREA 500 MG PO CAPS: 1000.0000 mg | ORAL_CAPSULE | Freq: Every day | ORAL | Status: DC

## 2012-09-18 MED ORDER — FOLIC ACID 1 MG PO TABS: 1.0000 mg | ORAL_TABLET | Freq: Every morning | ORAL | Status: DC

## 2012-09-18 MED ORDER — SODIUM CHLORIDE 0.9 % IJ SOLN: 10.0000 mL | INTRAMUSCULAR | Status: DC | PRN

## 2012-09-18 MED ORDER — RIVAROXABAN 20 MG PO TABS
20.0000 mg | ORAL_TABLET | Freq: Every day | ORAL | Status: DC
  Filled 2012-09-18: qty 1

## 2012-09-18 MED ORDER — SENNOSIDES-DOCUSATE SODIUM 8.6-50 MG PO TABS
1.0000 | ORAL_TABLET | Freq: Two times a day (BID) | ORAL | Status: DC
  Administered 2012-09-18: 1 via ORAL

## 2012-09-18 MED ORDER — ONDANSETRON HCL 4 MG PO TABS: 4.0000 mg | ORAL_TABLET | ORAL | Status: DC | PRN

## 2012-09-18 MED ORDER — ONDANSETRON HCL 4 MG/2ML IJ SOLN: 4.0000 mg | INTRAMUSCULAR | Status: DC | PRN

## 2012-09-18 MED ORDER — DEXTROSE-NACL 5-0.45 % IV SOLN
INTRAVENOUS | Status: DC
  Administered 2012-09-18: 12:00:00 via INTRAVENOUS

## 2012-09-18 MED ORDER — KETOROLAC TROMETHAMINE 30 MG/ML IJ SOLN
30.0000 mg | Freq: Four times a day (QID) | INTRAMUSCULAR | Status: DC
  Administered 2012-09-18: 30 mg via INTRAVENOUS

## 2012-09-18 MED ORDER — HEPARIN SOD (PORK) LOCK FLUSH 100 UNIT/ML IV SOLN
500.0000 [IU] | INTRAVENOUS | Status: DC | PRN
  Filled 2012-09-18: qty 5

## 2012-09-18 NOTE — Discharge Summary (Signed)
[facility] Discharge Summary   Patient ID: [name] MRN: [id number] DOB/AGE: 1980/11/01 32 y.o.  Admit date: 09/18/2012 Discharge date: 09/18/2012  Primary Care Physician:  [name],Kaysha Parsell A., MD  Admission Diagnoses:  Active Problems: Vaso occlusive crisis with active hemolysis  Acute on chronic pain  Leucocytosis  Hx of PE  Discharge Diagnoses:   Vaso occlusive crisis with active hemolysis  Acute on chronic pain  Leucocytosis  Hx of PE  Discharge Medications:    Medication List    ASK your doctor about these medications       celecoxib 200 MG capsule  Commonly known as:  CELEBREX  Take 1 capsule (200 mg total) by mouth 2 (two) times daily as needed for pain.     folic acid 1 MG tablet  Commonly known as:  FOLVITE  Take 1 tablet (1 mg total) by mouth every morning.     HYDROmorphone 4 MG tablet  Commonly known as:  DILAUDID  Take 1 tablet (4 mg total) by mouth every 4 (four) hours as needed for pain.     hydroxyurea 500 MG capsule  Commonly known as:  HYDREA  Take 2 capsules (1,000 mg total) by mouth daily. May take with food to minimize GI side effects.     morphine 15 MG 12 hr tablet  Commonly known as:  MS CONTIN  Take 1 tablet (15 mg total) by mouth 2 (two) times daily.     potassium chloride SA 20 MEQ tablet  Commonly known as:  K-DUR,KLOR-CON  Take 1 tablet (20 mEq total) by mouth every morning.     XARELTO 20 MG Tabs tablet  Generic drug:  Rivaroxaban  Take 20 mg by mouth every morning.     zolpidem 10 MG tablet  Commonly known as:  AMBIEN  Take 1 tablet (10 mg total) by mouth at bedtime as needed for sleep. For sleep         Consults:  None  Significant Diagnostic Studies:  Dg Chest 2 View  09/17/2012   *RADIOLOGY REPORT*  Clinical Data: Shortness of breath  CHEST - 2 VIEW  Comparison: 09/10/2012  Findings: There is a left chest wall porta-catheter with tip in the cavoatrial junction.  Heart size appears mildly  enlarged.  No pleural effusion or edema identified.  No airspace consolidation noted.  Mild spondylosis identified within the thoracic spine.  IMPRESSION:  1.  No acute cardiopulmonary abnormalities.   Original Report Authenticated By: Signa Kell, M.D.   Dg Chest 2 View  09/10/2012   *RADIOLOGY REPORT*  Clinical Data: Sickle cell pain crisis.  CHEST - 2 VIEW  Comparison: 08/18/2012.  Findings: Unchanged appearance of left subclavian portacatheter, tip at the superior cavoatrial junction.  Stable osseous findings, including bilateral proximal humeral osteonecrosis.  Chronic cardiomegaly.  Unremarkable upper mediastinal contours.  Coarsened interstitial markings without edema or infiltrate.  No effusion or pneumothorax.  IMPRESSION:  Chronic interstitial changes and cardiomegaly.  No evidence of acute cardiopulmonary disease.   Original Report Authenticated By: Tiburcio Pea   Ct Angio Chest Pe W/cm &/or Wo Cm  08/27/2012   **ADDENDUM** CREATED: 08/27/2012 17:11:37  Comment:  I communicated this report directly by phone to Dr. Ashley Royalty at the [facility], August 27, 2012, 05:11 p.m.  **END ADDENDUM** SIGNED BY: Rutherford Guys. Margarita Grizzle, M.D.  08/27/2012   *RADIOLOGY REPORT*  Clinical Data: Shortness of breath  CT ANGIOGRAPHY CHEST  Technique:  Multidetector CT imaging of the chest using the  standard protocol during bolus administration of intravenous contrast. Multiplanar reconstructed images including MIPs were obtained and reviewed to evaluate the vascular anatomy.  Contrast: OMNIPAQUE IOHEXOL 350 MG/ML SOLN  Comparison:  Chest radiograph August 18, 2012 and chest CT angiogram March 01, 2012  Findings: There is a pulmonary embolus in a peripheral branch of the superior   right lower lobe superior segment pulmonary artery. There is no thoracic aortic aneurysm or dissection.  There is patchy infiltrate in the anterior and lateral segments of the left lower lobe.  There is also patchy infiltrate in the  lateral segment right lower lobe.  There is patchy bibasilar atelectasis.  There is also mild atelectatic change in the posterior segment right upper lobe.  There is mild scarring in the anterior segment left upper lobe medially.  There is a small area of infiltrate in the anterior segment left upper lobe on slice 27.  There are multiple lymph nodes throughout the mediastinum, largest adjacent to the aortic arch.  The largest of these lymph nodes measures 1 x 0.9 cm.  By size criteria, there is no frank adenopathy.  Port-A-Cath tip is at the cavoatrial junction.  Heart is enlarged.  Pericardium is not thickened.  Visualized upper abdominal structures appear normal except for diffuse calcification in the spleen.  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  IMPRESSION: Small pulmonary embolus in a branch of the superior segment right lower lobe pulmonary artery.  No other pulmonary emboli identified.  Bony changes of sickle cell disease.  Splenic changes of sickle cell disease.  Areas of patchy infiltrate bilaterally.  Stable small mediastinal lymph nodes which are stable.   Original Report Authenticated By: Bretta Bang, M.D.     [facility] Course:  For complete details please refer to admission H and P, but in brief,  [name] is a 32 year old African American Male with SS genotype SCD. He has a PMH of pulmonary embolism , mood disorder and hypokalemia. He was treated in the [facility] on yesterday for same symptoms with little improvement at home. He returned to day for further management of symptoms . He was  readmitted and treatment included:  Vaso occlusive crisis with active hemolysis with a decline in tototal brillouin and retic count- increased pain.Gentle hydration with D51/2 NS at 125cc/hr, scheduled 4mg  of hydromorphone Q 2hrs for 3 doses than prn- Q 2 hrs. Antipruitics and antiemetics prn for associated side  effects from analgesics. Continue mainteanace medication of Folic acid 1mg  and hydroxyurea 1000 mg QD. Time of d/c discussed the importance of hydration and recc  64 oz of water an hour.  Acute on chronic pain: continue long acting analgesic. Direction change in his hydromorphone to further manage pain crisis he has been instructed to take 2(4mg ) of hydromorphone Q 4hrs  for the next 2-3 days than resume to previous dose of 4mg  Q 4hrs prn   Hx of hypokalemia: stable continued home dose of K dur 20 meq QD  Hx of PE: continue home dose of Xareto   Physical Exam at Discharge:  BP 108/71  Pulse 73  Temp(Src) 98.4 F (36.9 C) (Oral)  Resp 15  SpO2 98%  Gen: Alert, cooperative, no distress Cardiovascular: Regular rate and rhythm, S1 and S2 normal, no murmur, rub or gallop  Respiratory: Clear to auscultation bilaterally, respirations unlabored  Gastrointestinal: Soft, non-tender, bowel sounds active all four quadrants,  Extremities:Extremities normal, atraumatic,  no cyanosis or edema Pulses: 2+ and symmetric all extremities   Disposition at Discharge: 01-Home or Self Care  Discharge Orders:   Condition at Discharge:   Stable  Time spent on Discharge:  Greater than 30 minutes.  SignedGrayce Sessions 09/18/2012, 5:48 PM

## 2012-09-18 NOTE — Telephone Encounter (Signed)
NP notified of assessment. Returned patient call. Per NP, this RN instructed patient to come to [facility]. Patient acknowledges.

## 2012-09-18 NOTE — Telephone Encounter (Signed)
Received patient call, patient c/o side and back pain, rating pain 8/10. Patient reports pain is the same as yesterday and that he was told if he wasn't feeling any better today to call back today. Patient reports lower abd pain. Patient denies all other pertinent ROS per [facility] Triage Assessment. Informed patient that this RN will notify Care Provider of assessment and patient is to receive a return call back with Provider recommendation. Patient acknowledges.

## 2012-09-18 NOTE — Progress Notes (Signed)
Patient discharged home. VSS. Patient in no apparent distress. Discharge instructions reviewed. [facility], left ambulatory via self.

## 2012-09-18 NOTE — H&P (Signed)
[facility] History and Physical   Date: 09/18/2012  Patient name: [name] Medical record number: [id number] Date of birth: October 29, 1980 Age: 32 y.o. Gender: male PCP: [name],Hiran Leard A., MD  Attending physician: [name],Denelda Akerley A., MD Chief Complaint:  Bilateral side and back pain, rating pain 8/10.   History of Present Illness: [name] is a 32 year old African American Male with SS genotype SCD. He has a PMH of pulmonary embolism , mood disorder and hypokalemia. He was treated in the [facility] on yesterday for same symptoms. He did not want to be  hospitalized and asked if he could manage his pain at home and after home medication management failed he called and was readmitted to the [facility] for the management of Vaso occlusive crisis with increased pain.  His pain is bilateral sides and lower back.  Pain is non radiating dull achy and consistent . His pain is in his  lower back and bilateral rib pain. He rates his pain 8/10. He denies shortness of breath, chest pain, dizziness, headaches, N/V/D, dysuria and priapism, He was admitted to the [facility] for acute management of symptoms. He is unable to identify any palliative or provocative features.   Meds: Prescriptions prior to admission  Medication Sig Dispense Refill  . celecoxib (CELEBREX) 200 MG capsule Take 1 capsule (200 mg total) by mouth 2 (two) times daily as needed for pain.  30 capsule  0  . folic acid (FOLVITE) 1 MG tablet Take 1 tablet (1 mg total) by mouth every morning.  30 tablet  11  . HYDROmorphone (DILAUDID) 4 MG tablet Take 1 tablet (4 mg total) by mouth every 4 (four) hours as needed for pain.  90 tablet  0  . hydroxyurea (HYDREA) 500 MG capsule Take 2 capsules (1,000 mg total) by mouth daily. May take with food to minimize GI side effects.  60 capsule  1  . morphine (MS CONTIN) 15 MG 12 hr tablet Take 1 tablet (15 mg total) by mouth 2 (two) times daily.  60 tablet  0  . potassium chloride SA  (K-DUR,KLOR-CON) 20 MEQ tablet Take 1 tablet (20 mEq total) by mouth every morning.  30 tablet  3  . Rivaroxaban (XARELTO) 20 MG TABS Take 20 mg by mouth every morning.       . zolpidem (AMBIEN) 10 MG tablet Take 1 tablet (10 mg total) by mouth at bedtime as needed for sleep. For sleep  30 tablet  1    Allergies: Morphine and related Past Medical History  Diagnosis Date  . Sickle cell anemia   . Blood transfusion   . 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 Hypokalemia   . Mood disorder   . Pulmonary embolism   . Avascular necrosis   . Leukocytosis     Chronic  . Thrombocytosis     Chronic  . Hypertension   . C. difficile colitis    Past Surgical History  Procedure Laterality Date  . Right hip replacement      08/2006  . Cholecystectomy      01/2008  . Porta cath placement    . Porta cath removal    . Umbilical hernia repair      01/2008  . Excision of left periauricular cyst      10/2009  . Excision of right ear lobe cyst with primary closur      11/2007  . Portacath placement  01/05/2012  Procedure: INSERTION PORT-A-CATH;  Surgeon: Adolph Pollack, MD;  Location: [facility] OR;  Service: General;  Laterality: N/A;  ultrasound guiced port a cath insertion with fluoroscopy   Family History  Problem Relation Age of Onset  . Sickle cell anemia Mother   . Sickle cell anemia Father   . Sickle cell trait Brother    History   Social History  . Marital Status: Single    Spouse Name: N/A    Number of Children: 0  . Years of Education: 13   Occupational History  . Unemployed    Social History Main Topics  . Smoking status: Former Smoker -- 13 years    Types: Cigarettes    Quit date: 07/08/2010  . Smokeless tobacco: Never Used  . Alcohol Use: No  . Drug Use: No     Comment: quit smoking 2011  . Sexual Activity: Yes    Partners: Female    Pharmacist, hospital Protection: None   Other Topics Concern  . Not on file   Social History Narrative    Lives in an apartment.  Single.  Lives with girlfriend.  Does not use any assist devices.        [name]:  [phone] Mom, emergency contact    Review of Systems: Review of Systems  Constitutional: Negative.   HENT: Negative.   Eyes: Negative.   Respiratory: Negative.   Musculoskeletal: Positive for back pain.       Bilateral; side pain clenches sides during examination   Skin: Negative.        Multiple tattoos   Neurological: Negative.   Endo/Heme/Allergies: Negative.   Psychiatric/Behavioral: The patient has insomnia.     Physical Exam: Blood pressure 110/61, pulse 71, temperature 98.5 F (36.9 C), temperature source Oral, resp. rate 16, SpO2 98.00%. BP 110/61  Pulse 71  Temp(Src) 98.5 F (36.9 C) (Oral)  Resp 16  SpO2 98%  General Appearance:    Alert, cooperative, no distress, appears stated age  Head:    Normocephalic, without obvious abnormality, atraumatic  Eyes:    PERRL, conjunctiva/corneas clear, EOM's intact, moderate    icteric  sclarea   Ears:    Normal TM's in left right has impacted cerumen and external ear canals, both ears  Nose:   Nares normal, septum midline, mucosa normal, no drainage    or sinus tenderness  Throat:   Lips, mucosa, and tongue normal; teeth and gums normal  Neck:   Supple, symmetrical, trachea midline, no adenopathy;       thyroid:  No enlargement/tenderness/nodules; no carotid   bruit or JVD  Back:     Symmetric, no curvature, ROM normal, no CVA tenderness  Lungs:     Clear to auscultation bilaterally, respirations unlabored  Chest wall:    No tenderness or deformity  Heart:    Regular rate and rhythm, S1 and S2 normal, no murmur, rub   or gallop  Abdomen:     Soft, non-tender, bowel sounds active all four quadrants,    no masses, no organomegaly  Genitalia:  Deferred denies priapism   Rectal:   Deferred   Extremities:   Extremities normal, atraumatic, no cyanosis or edema  Pulses:   2+ and symmetric all extremities   Skin:   Skin color, texture, turgor normal, no rashes or lesions, numerous tattoos   Lymph nodes:   Cervical, supraclavicular, and axillary nodes normal  Neurologic:   CNII-XII intact    Lab results: No results found for this  or any previous visit (from the past 24 hour(s)).  Imaging results:  Dg Chest 2 View  09/17/2012   *RADIOLOGY REPORT*  Clinical Data: Shortness of breath  CHEST - 2 VIEW  Comparison: 09/10/2012  Findings: There is a left chest wall porta-catheter with tip in the cavoatrial junction.  Heart size appears mildly enlarged.  No pleural effusion or edema identified.  No airspace consolidation noted.  Mild spondylosis identified within the thoracic spine.  IMPRESSION:  1.  No acute cardiopulmonary abnormalities.   Original Report Authenticated By: Signa Kell, M.D.     Assessment & Plan:   1.Vaso occlusive crisis with hemolysis:   Treatment /plan is to aggress ely  hydrate and place on scheduled analgesic than prn . Resume home meds folic acid,   hydrea and long acting analgesic.  2. 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 Adjunct therapy Toradol 30mg  Q 6hrs and followed. No s/s of of infection at this time 3. Hx of Pulmonary emboli. Cont Xarelto    Riniyah Speich P 09/18/2012, 12:18 PM

## 2012-09-18 NOTE — H&P (Signed)
[facility] History and Physical   Date: 09/18/2012  Patient name: [name] Medical record number: [id number] Date of birth: [birth year] Age: 32 y.o. Gender: male PCP: [name],[name], MD  Attending physician: [name],[name], MD Chief Complaint:  Bilateral side and back pain, rating pain 8/10.   History of Present Illness: Mr [name] is well known to me and is not opoid naive. He is  a 32 year old African American Male with SS genotype SCD. He has a PMH of pulmonary embolism , mood disorder and hypokalemia. He was treated in the [facility] on yesterday for same symptoms. He did not want to be  hospitalized and asked if he could manage his pain at home and after home medication management failed he called and was readmitted to the [facility] for the management of Vaso occlusive crisis with increased pain.  His pain is bilateral sides and lower back.  Pain is non radiating dull achy and consistent . His pain is in his  lower back and bilateral rib pain. He rates his pain 8/10. He denies shortness of breath, chest pain, dizziness, headaches, N/V/D, dysuria and priapism, He was admitted to the [facility] for acute management of symptoms. He is unable to identify any palliative or provocative features.   Meds: Prescriptions prior to admission  Medication Sig Dispense Refill  . celecoxib (CELEBREX) 200 MG capsule Take 1 capsule (200 mg total) by mouth 2 (two) times daily as needed for pain.  30 capsule  0  . folic acid (FOLVITE) 1 MG tablet Take 1 tablet (1 mg total) by mouth every morning.  30 tablet  11  . HYDROmorphone (DILAUDID) 4 MG tablet Take 1 tablet (4 mg total) by mouth every 4 (four) hours as needed for pain.  90 tablet  0  . hydroxyurea (HYDREA) 500 MG capsule Take 2 capsules (1,000 mg total) by mouth daily. May take with food to minimize GI side effects.  60 capsule  1  . morphine (MS CONTIN) 15 MG 12 hr tablet Take 1 tablet (15 mg total) by mouth 2 (two) times daily.  60  tablet  0  . potassium chloride SA (K-DUR,KLOR-CON) 20 MEQ tablet Take 1 tablet (20 mEq total) by mouth every morning.  30 tablet  3  . Rivaroxaban (XARELTO) 20 MG TABS Take 20 mg by mouth every morning.       . zolpidem (AMBIEN) 10 MG tablet Take 1 tablet (10 mg total) by mouth at bedtime as needed for sleep. For sleep  30 tablet  1    Allergies: Morphine and related Past Medical History  Diagnosis Date  . Sickle cell anemia   . Blood transfusion   . 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 Hypokalemia   . Mood disorder   . Pulmonary embolism   . Avascular necrosis   . Leukocytosis     Chronic  . Thrombocytosis     Chronic  . Hypertension   . C. difficile colitis    Past Surgical History  Procedure Laterality Date  . Right hip replacement      08/2006  . Cholecystectomy      01/2008  . Porta cath placement    . Porta cath removal    . Umbilical hernia repair      01/2008  . Excision of left periauricular cyst      10/2009  . Excision of right ear lobe cyst with primary closur  11/2007  . Portacath placement  01/05/2012    Procedure: INSERTION PORT-A-CATH;  Surgeon: Adolph Pollack, MD;  Location: [facility] OR;  Service: General;  Laterality: N/A;  ultrasound guiced port a cath insertion with fluoroscopy   Family History  Problem Relation Age of Onset  . Sickle cell anemia Mother   . Sickle cell anemia Father   . Sickle cell trait Brother    History   Social History  . Marital Status: Single    Spouse Name: N/A    Number of Children: 0  . Years of Education: 13   Occupational History  . Unemployed    Social History Main Topics  . Smoking status: Former Smoker -- 13 years    Types: Cigarettes    Quit date: 07/08/2010  . Smokeless tobacco: Never Used  . Alcohol Use: No  . Drug Use: No     Comment: quit smoking 2011  . Sexual Activity: Yes    Partners: Female    Pharmacist, hospital Protection: None   Other Topics Concern  . Not on  file   Social History Narrative   Lives in an apartment.  Single.  Lives with girlfriend.  Does not use any assist devices.        [name]:  [phone] Mom, emergency contact    Review of Systems: Review of Systems  Constitutional: Negative.   HENT: Negative.   Eyes: Negative.   Respiratory: Negative.   Musculoskeletal: Positive for back pain.       Bilateral; side pain clenches sides during examination   Skin: Negative.        Multiple tattoos   Neurological: Negative.   Endo/Heme/Allergies: Negative.   Psychiatric/Behavioral: The patient has insomnia.     Physical Exam: Blood pressure 130/74, pulse 74, temperature 98.9 F (37.2 C), temperature source Oral, resp. rate 18, SpO2 98.00%. BP 130/74  Pulse 66  Temp(Src) 98.9 F (37.2 C) (Oral)  Resp 17  SpO2 99%  General Appearance:    Alert, cooperative, no distress, appears stated age  Head:    Normocephalic, without obvious abnormality, atraumatic  Eyes:    PERRL, conjunctiva/corneas clear, EOM's intact, fundi    benign, both eyes       Ears:    Normal TM's not visible in left ear impacted with cerumen  and external ear canals, both ears  Nose:   Nares normal, septum midline, mucosa normal, no drainage    or sinus tenderness  Throat:   Lips, mucosa, and tongue normal; teeth and gums normal  Neck:   Supple, symmetrical, trachea midline, no adenopathy;       thyroid:  No enlargement/tenderness/nodules; no carotid   bruit or JVD  Back:     Symmetric, no curvature, ROM normal, no CVA tenderness  Lungs:     Clear to auscultation bilaterally, respirations unlabored  Chest wall:   port a cath Left upper chest   Heart:    Regular rate and rhythm, S1 and S2 normal, no murmur, rub   or gallop  Abdomen:     Soft, non-tender, bowel sounds active all four quadrants,    Genitalia:   Denies priapism   Rectal:  Deferred   Extremities:   Extremities normal, atraumatic, no cyanosis or edema  Pulses:   2+ and symmetric all  extremities  Skin:   Skin color, texture, turgor normal, no rashes or lesions, multiple tatoos  Lymph nodes:   Cervical, supraclavicular, and axillary nodes normal  Neurologic:  CNII-XII intact.    Lab results: Results for orders placed during the hospital encounter of 09/18/12 (from the past 24 hour(s))  COMPREHENSIVE METABOLIC PANEL     Status: Abnormal   Collection Time    09/18/12 12:00 PM      Result Value Range   Sodium 137  135 - 145 mEq/L   Potassium 3.8  3.5 - 5.1 mEq/L   Chloride 105  96 - 112 mEq/L   CO2 24  19 - 32 mEq/L   Glucose, Bld 102 (*) 70 - 99 mg/dL   BUN 7  6 - 23 mg/dL   Creatinine, Ser 1.61  0.50 - 1.35 mg/dL   Calcium 8.7  8.4 - 09.6 mg/dL   Total Protein 7.0  6.0 - 8.3 g/dL   Albumin 3.7  3.5 - 5.2 g/dL   AST 35  0 - 37 U/L   ALT 24  0 - 53 U/L   Alkaline Phosphatase 82  39 - 117 U/L   Total Bilirubin 6.7 (*) 0.3 - 1.2 mg/dL   GFR calc non Af Amer >90  >90 mL/min   GFR calc Af Amer >90  >90 mL/min    Imaging results:  Dg Chest 2 View  09/17/2012   *RADIOLOGY REPORT*  Clinical Data: Shortness of breath  CHEST - 2 VIEW  Comparison: 09/10/2012  Findings: There is a left chest wall porta-catheter with tip in the cavoatrial junction.  Heart size appears mildly enlarged.  No pleural effusion or edema identified.  No airspace consolidation noted.  Mild spondylosis identified within the thoracic spine.  IMPRESSION:  1.  No acute cardiopulmonary abnormalities.   Original Report Authenticated By: Signa Kell, M.D.   Assessment & Plan: Vaso occlusive crisis with active hemolysis: marked improvement from yesterday with reviewing labs  Gentle hydration with D51/2 NS at 125cc/hr, scheduled 4mg  of hydromorphone Q 2hrs for 3 doses than prn- Q 2 hrs. Antipruitics and antiemetics prn for associated side effects from analgesics.  Continue mainteanace medication of Folic acid 1mg  and hydroxyurea 1000 mg QD  Acute on chronic pain: continue long acting analgesic   Leucocytosis: secondary to inflammatory/reactive process with VOC, no s/s of infection. Adjunct therapy Toradol 30mg  Q 6hrs  Mood disorder: admits to abruptly stopping Zyprexa monitor  Hx of hypokalemia: stable at this time continue Kdur  QD Hx of  PE continue Xarelto 20mg  QD  Constipation secondary to narcotic use: Senokot S   [name], [name] 09/18/2012, 12:43 PM

## 2012-09-19 ENCOUNTER — Encounter (HOSPITAL_COMMUNITY): Payer: Self-pay | Admitting: *Deleted

## 2012-09-19 ENCOUNTER — Inpatient Hospital Stay (HOSPITAL_COMMUNITY)
Admission: EM | Admit: 2012-09-19 | Discharge: 2012-09-23 | DRG: 812 | Disposition: A | Discharge status: Home / Self Care | Payer: Medicare Other | Attending: Internal Medicine | Admitting: Internal Medicine

## 2012-09-19 DIAGNOSIS — M8700 Idiopathic aseptic necrosis of unspecified bone: Secondary | ICD-10-CM | POA: Diagnosis present

## 2012-09-19 DIAGNOSIS — Z7901 Long term (current) use of anticoagulants: Secondary | ICD-10-CM

## 2012-09-19 DIAGNOSIS — I10 Essential (primary) hypertension: Secondary | ICD-10-CM | POA: Diagnosis not present

## 2012-09-19 DIAGNOSIS — I2782 Chronic pulmonary embolism: Secondary | ICD-10-CM | POA: Diagnosis present

## 2012-09-19 DIAGNOSIS — Z96649 Presence of unspecified artificial hip joint: Secondary | ICD-10-CM

## 2012-09-19 DIAGNOSIS — D571 Sickle-cell disease without crisis: Secondary | ICD-10-CM | POA: Diagnosis not present

## 2012-09-19 DIAGNOSIS — F39 Unspecified mood [affective] disorder: Secondary | ICD-10-CM | POA: Diagnosis present

## 2012-09-19 DIAGNOSIS — D72829 Elevated white blood cell count, unspecified: Secondary | ICD-10-CM | POA: Diagnosis present

## 2012-09-19 DIAGNOSIS — E876 Hypokalemia: Secondary | ICD-10-CM | POA: Diagnosis present

## 2012-09-19 DIAGNOSIS — K5900 Constipation, unspecified: Secondary | ICD-10-CM | POA: Diagnosis not present

## 2012-09-19 DIAGNOSIS — D5700 Hb-SS disease with crisis, unspecified: Principal | ICD-10-CM | POA: Diagnosis present

## 2012-09-19 DIAGNOSIS — Z885 Allergy status to narcotic agent status: Secondary | ICD-10-CM | POA: Diagnosis not present

## 2012-09-19 NOTE — ED Notes (Signed)
Pt states that he has been hurting off and on for approx 1 week;pt has been seen at [facility] a few times in the last week for the pain; pt was dc'd from the [facility] yesterday; pt states that he still continues to have bilateral rib, lower back and leg pain.

## 2012-09-20 ENCOUNTER — Encounter (HOSPITAL_COMMUNITY): Payer: Self-pay | Admitting: Neurology

## 2012-09-20 ENCOUNTER — Emergency Department (HOSPITAL_COMMUNITY): Payer: Medicare Other

## 2012-09-20 DIAGNOSIS — D5700 Hb-SS disease with crisis, unspecified: Secondary | ICD-10-CM | POA: Diagnosis present

## 2012-09-20 LAB — COMPREHENSIVE METABOLIC PANEL
ALT: 23 U/L (ref 0–53)
Alkaline Phosphatase: 85 U/L (ref 39–117)
BUN: 5 mg/dL — ABNORMAL LOW (ref 6–23)
CO2: 26 mEq/L (ref 19–32)
Calcium: 8.6 mg/dL (ref 8.4–10.5)
GFR calc Af Amer: >90 mL/min (ref >90)
GFR calc non Af Amer: >90 mL/min (ref >90)
Glucose, Bld: 103 mg/dL — ABNORMAL HIGH (ref 70–99)
Potassium: 3.4 mEq/L — ABNORMAL LOW (ref 3.5–5.1)
Sodium: 138 mEq/L (ref 135–145)

## 2012-09-20 LAB — CBC WITH DIFFERENTIAL/PLATELET
Basophils Absolute: 0.1 10*3/uL (ref 0.0–0.1)
Eosinophils Absolute: 0.5 10*3/uL (ref 0.0–0.7)
Eosinophils Relative: 3 % (ref 0–5)
HCT: 23.4 % — ABNORMAL LOW (ref 39.0–52.0)
Lymphocytes Relative: 13 % (ref 12–46)
MCH: 32.4 pg (ref 26.0–34.0)
MCV: 94.7 fL (ref 78.0–100.0)
Monocytes Absolute: 2.3 10*3/uL — ABNORMAL HIGH (ref 0.1–1.0)
Platelets: 508 10*3/uL — ABNORMAL HIGH (ref 150–400)
RDW: 18.8 % — ABNORMAL HIGH (ref 11.5–15.5)
WBC: 17.4 10*3/uL — ABNORMAL HIGH (ref 4.0–10.5)

## 2012-09-20 LAB — CBC
HCT: 21.8 % — ABNORMAL LOW (ref 39.0–52.0)
Hemoglobin: 7.3 g/dL — ABNORMAL LOW (ref 13.0–17.0)
MCH: 31.6 pg (ref 26.0–34.0)
MCV: 94.4 fL (ref 78.0–100.0)
RBC: 2.31 MIL/uL — ABNORMAL LOW (ref 4.22–5.81)
WBC: 14.4 10*3/uL — ABNORMAL HIGH (ref 4.0–10.5)

## 2012-09-20 LAB — LACTATE DEHYDROGENASE: LDH: 358 U/L — ABNORMAL HIGH (ref 94–250)

## 2012-09-20 LAB — RETICULOCYTES: Retic Ct Pct: 12.6 % — ABNORMAL HIGH (ref 0.4–3.1)

## 2012-09-20 LAB — BASIC METABOLIC PANEL
CO2: 26 mEq/L (ref 19–32)
Calcium: 8.2 mg/dL — ABNORMAL LOW (ref 8.4–10.5)
GFR calc non Af Amer: >90 mL/min (ref >90)
Sodium: 136 mEq/L (ref 135–145)

## 2012-09-20 LAB — RAPID URINE DRUG SCREEN, HOSP PERFORMED: Opiates: POSITIVE — AB

## 2012-09-20 IMAGING — CR DG CHEST 2V
2 series · 2 of 2 positions shown · non-contrast
Comparison: 09/23/2009

CLINICAL DATA: Chest pain.  Sickle cell disease.

CHEST - 2 VIEW

[w chest pa]
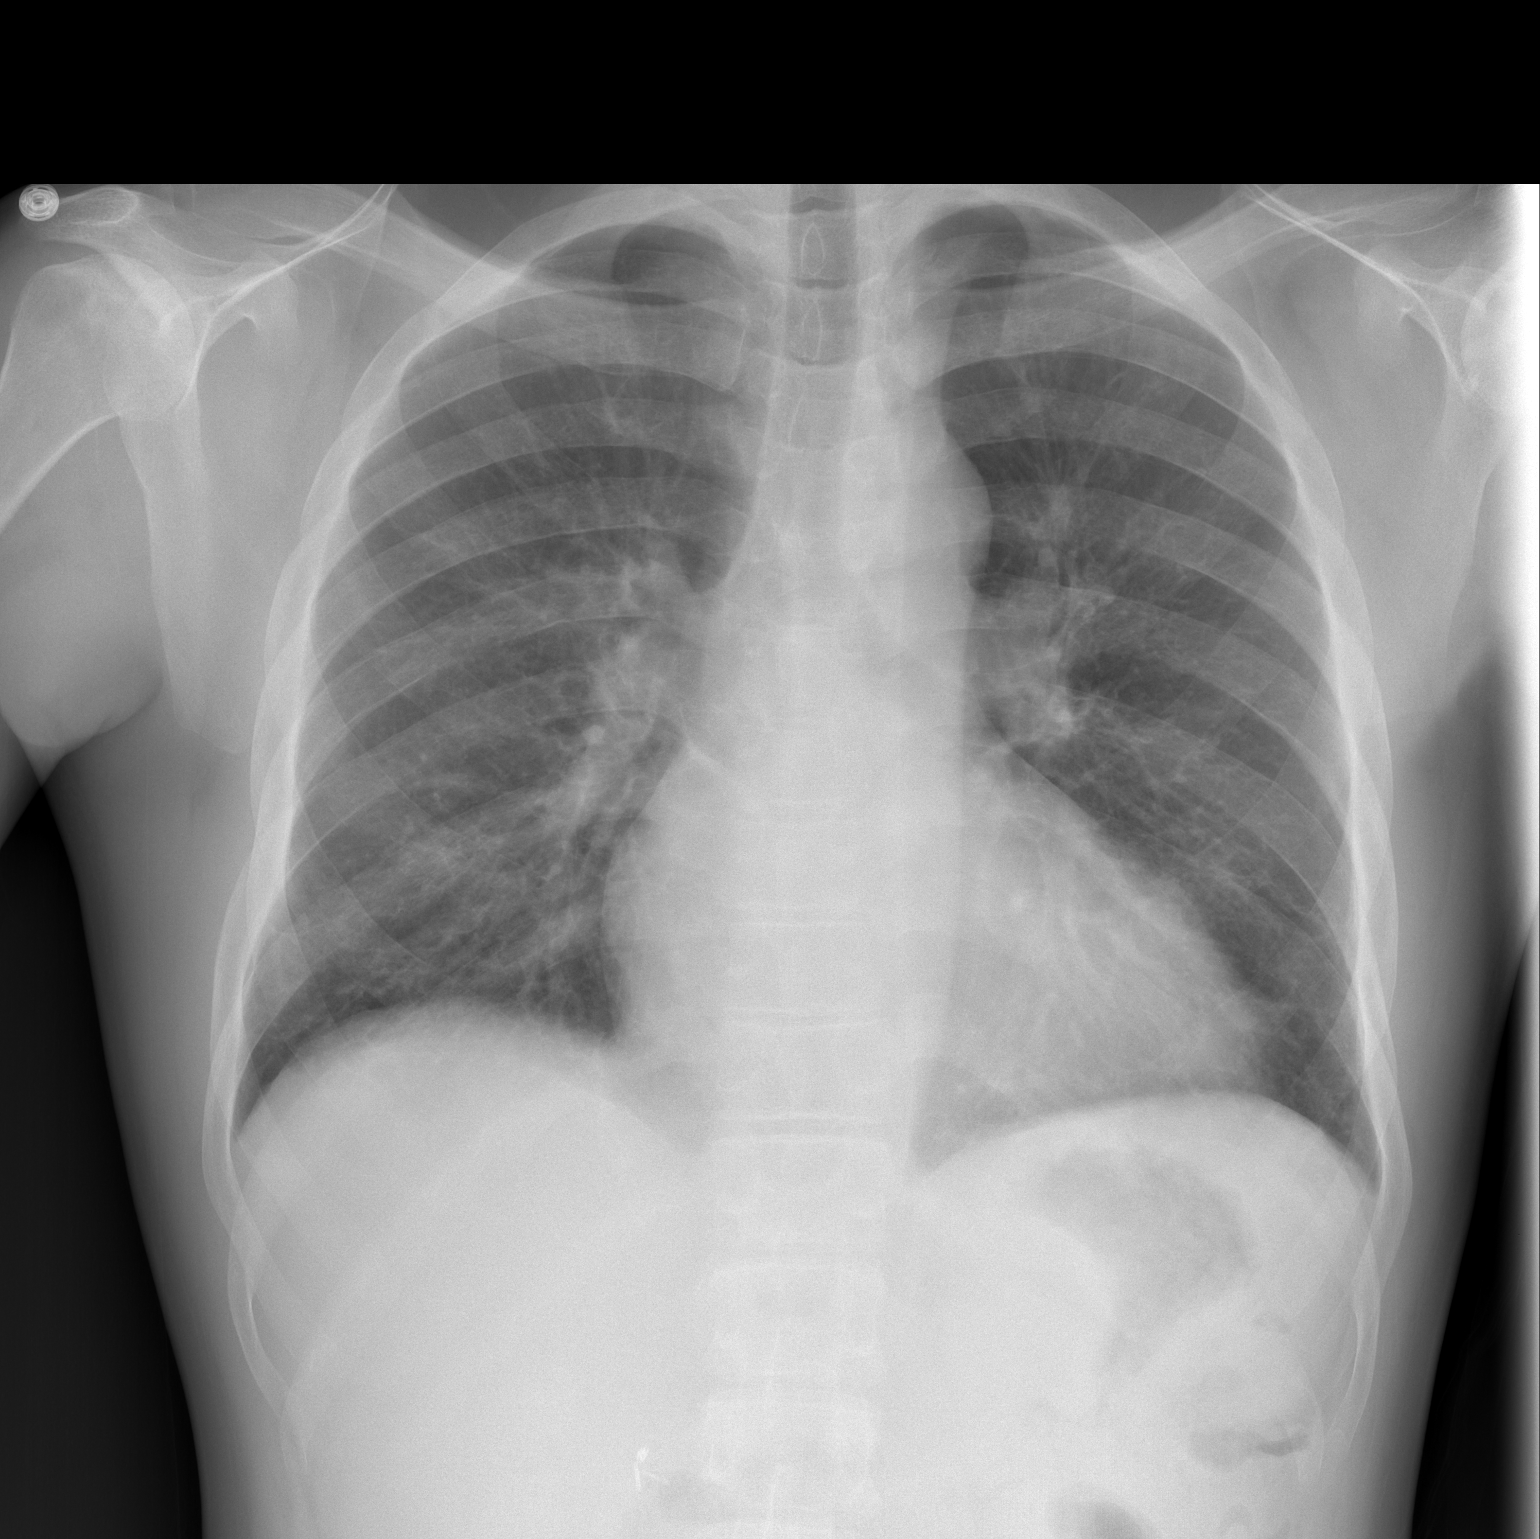

[w chest lat]
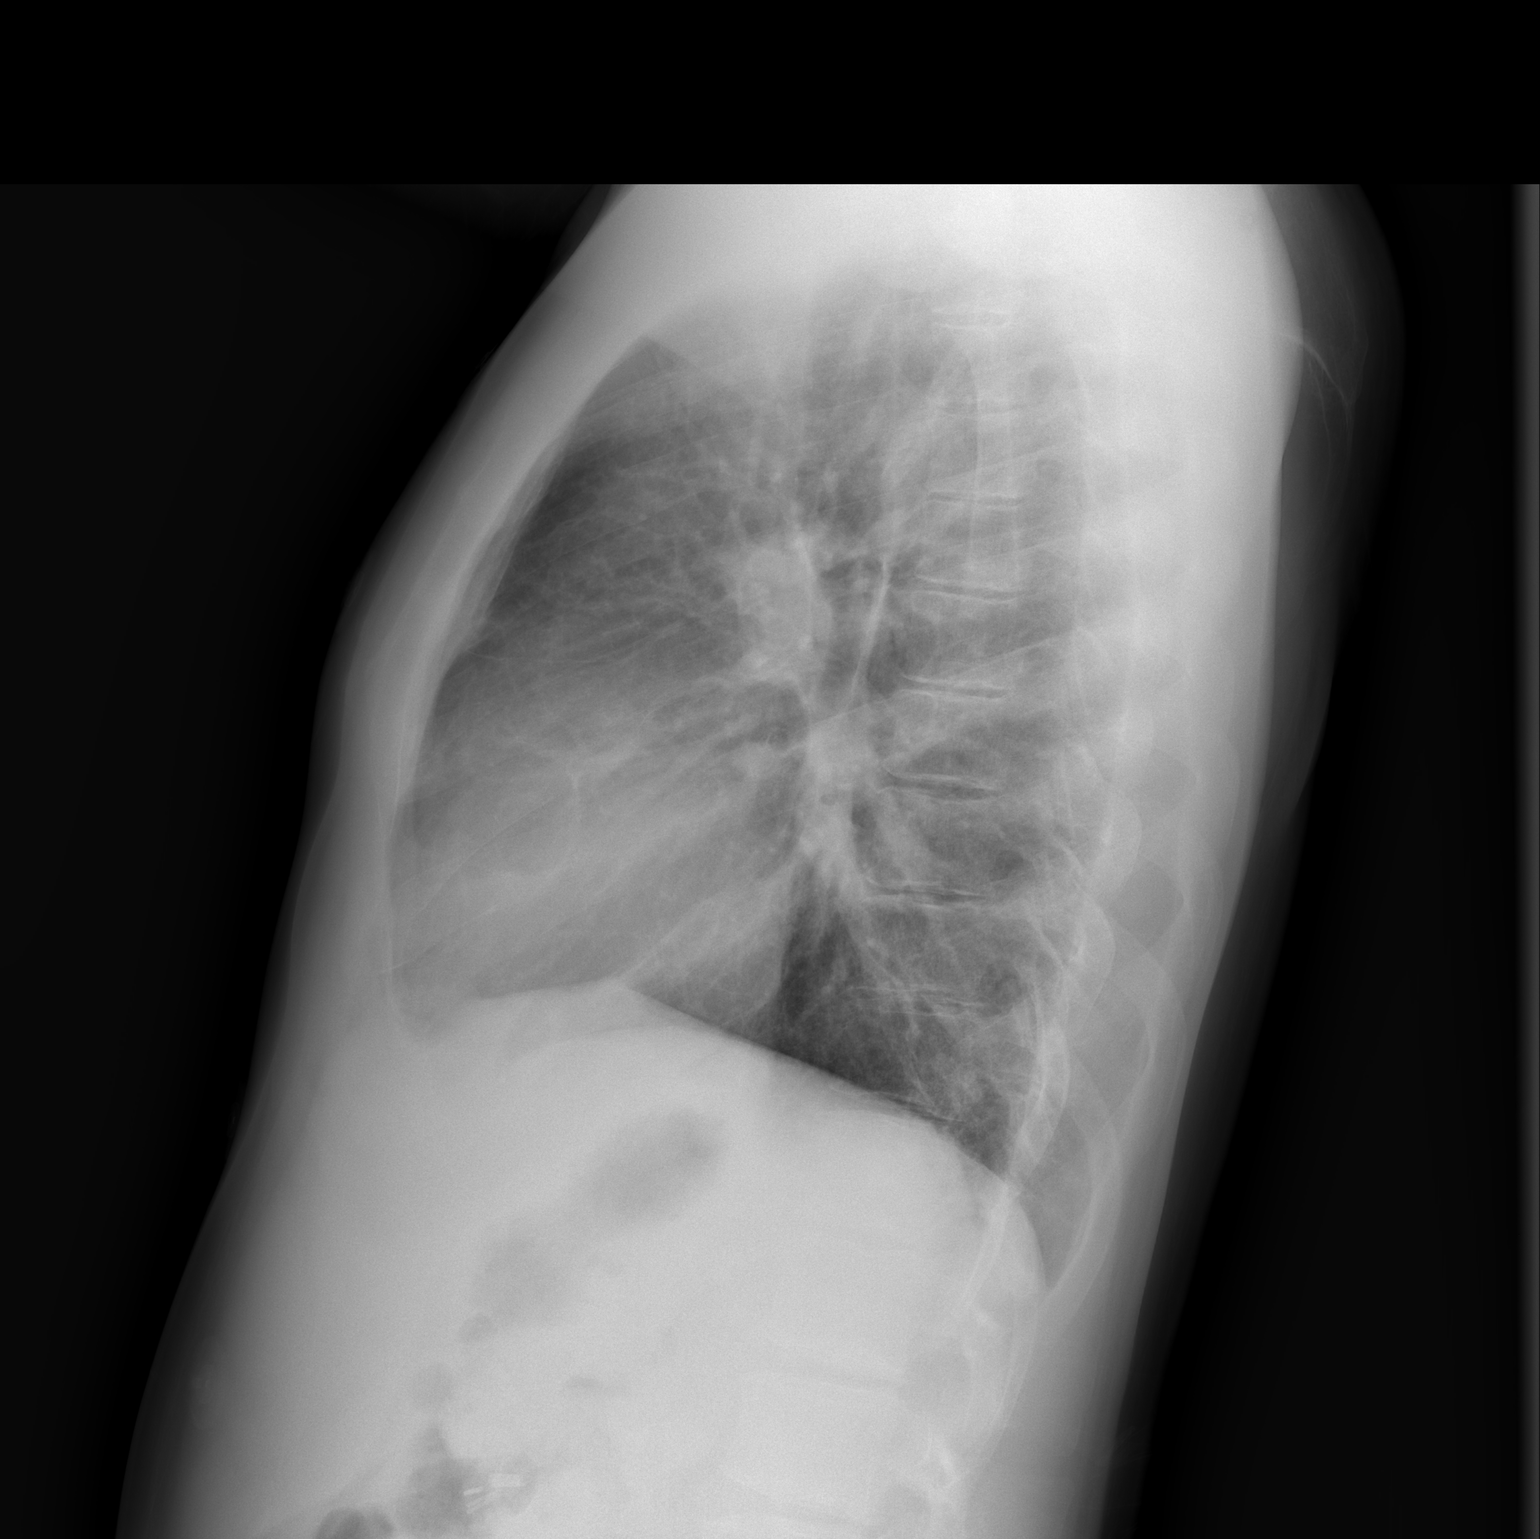

[2 of 2 positions shown; findings below may reference images not displayed]

FINDINGS: Cardiomegaly is again noted.  Mediastinal shadows are
normal.  Interstitial lung markings are slightly more prominent.
This could indicate to bronchitis or mild interstitial edema.  No
consolidation, collapse or effusion.  Bony structures are
unremarkable.
IMPRESSION: Cardiomegaly, unchanged.  More prominent interstitial markings that
could reflect bronchitis or mild interstitial edema.

## 2012-09-20 MED ORDER — DEXTROSE-NACL 5-0.45 % IV SOLN
INTRAVENOUS | Status: DC
  Administered 2012-09-20 (×2): via INTRAVENOUS
  Administered 2012-09-20: 100 mL/h via INTRAVENOUS
  Administered 2012-09-21: 10:00:00 via INTRAVENOUS

## 2012-09-20 MED ORDER — HYDROMORPHONE HCL PF 1 MG/ML IJ SOLN
1.0000 mg | Freq: Once | INTRAMUSCULAR | Status: AC
  Administered 2012-09-20: 1 mg via INTRAVENOUS
  Filled 2012-09-20: qty 1

## 2012-09-20 MED ORDER — HYDROXYUREA 500 MG PO CAPS
1000.0000 mg | ORAL_CAPSULE | Freq: Every day | ORAL | Status: DC
  Administered 2012-09-20 – 2012-09-23 (×4): 1000 mg via ORAL
  Filled 2012-09-20 (×4): qty 2

## 2012-09-20 MED ORDER — HYDROMORPHONE HCL PF 2 MG/ML IJ SOLN
3.0000 mg | INTRAMUSCULAR | Status: DC | PRN
  Administered 2012-09-20 – 2012-09-21 (×11): 3 mg via INTRAVENOUS
  Filled 2012-09-20 (×11): qty 2

## 2012-09-20 MED ORDER — SODIUM CHLORIDE 0.9 % IV BOLUS (SEPSIS)
1000.0000 mL | Freq: Once | INTRAVENOUS | Status: AC
  Administered 2012-09-20: 1000 mL via INTRAVENOUS

## 2012-09-20 MED ORDER — POTASSIUM CHLORIDE CRYS ER 20 MEQ PO TBCR
20.0000 meq | EXTENDED_RELEASE_TABLET | Freq: Every morning | ORAL | Status: DC
  Administered 2012-09-20: 20 meq via ORAL
  Filled 2012-09-20 (×2): qty 1

## 2012-09-20 MED ORDER — DIPHENHYDRAMINE HCL 25 MG PO CAPS
25.0000 mg | ORAL_CAPSULE | Freq: Four times a day (QID) | ORAL | Status: DC | PRN
  Administered 2012-09-20 – 2012-09-22 (×2): 25 mg via ORAL
  Filled 2012-09-20 (×2): qty 1

## 2012-09-20 MED ORDER — HYDROMORPHONE HCL PF 2 MG/ML IJ SOLN
2.0000 mg | INTRAMUSCULAR | Status: DC | PRN
  Administered 2012-09-20 (×2): 2 mg via INTRAVENOUS
  Filled 2012-09-20 (×2): qty 1

## 2012-09-20 MED ORDER — KETOROLAC TROMETHAMINE 30 MG/ML IJ SOLN
30.0000 mg | Freq: Three times a day (TID) | INTRAMUSCULAR | Status: AC
  Administered 2012-09-20 – 2012-09-21 (×5): 30 mg via INTRAVENOUS
  Filled 2012-09-20 (×5): qty 1

## 2012-09-20 MED ORDER — DIPHENHYDRAMINE HCL 50 MG/ML IJ SOLN
25.0000 mg | Freq: Once | INTRAMUSCULAR | Status: AC
  Administered 2012-09-20: 25 mg via INTRAVENOUS
  Filled 2012-09-20: qty 1

## 2012-09-20 MED ORDER — HYDROMORPHONE HCL PF 2 MG/ML IJ SOLN
2.0000 mg | Freq: Once | INTRAMUSCULAR | Status: AC
  Administered 2012-09-20: 2 mg via INTRAVENOUS
  Filled 2012-09-20: qty 1

## 2012-09-20 MED ORDER — RIVAROXABAN 20 MG PO TABS
20.0000 mg | ORAL_TABLET | Freq: Every morning | ORAL | Status: DC
  Administered 2012-09-20 – 2012-09-23 (×4): 20 mg via ORAL
  Filled 2012-09-20 (×5): qty 1

## 2012-09-20 MED ORDER — FOLIC ACID 1 MG PO TABS
1.0000 mg | ORAL_TABLET | Freq: Every morning | ORAL | Status: DC
  Administered 2012-09-20 – 2012-09-23 (×4): 1 mg via ORAL
  Filled 2012-09-20 (×4): qty 1

## 2012-09-20 MED ORDER — KETOROLAC TROMETHAMINE 30 MG/ML IJ SOLN
30.0000 mg | Freq: Once | INTRAMUSCULAR | Status: AC
  Administered 2012-09-20: 30 mg via INTRAVENOUS
  Filled 2012-09-20: qty 1

## 2012-09-20 MED ORDER — MORPHINE SULFATE ER 15 MG PO TBCR
15.0000 mg | EXTENDED_RELEASE_TABLET | Freq: Two times a day (BID) | ORAL | Status: DC
  Administered 2012-09-20 – 2012-09-23 (×7): 15 mg via ORAL
  Filled 2012-09-20 (×7): qty 1

## 2012-09-20 MED ORDER — ONDANSETRON HCL 4 MG/2ML IJ SOLN
4.0000 mg | Freq: Once | INTRAMUSCULAR | Status: AC
  Administered 2012-09-20: 4 mg via INTRAVENOUS
  Filled 2012-09-20: qty 2

## 2012-09-20 NOTE — ED Provider Notes (Signed)
Medical screening examination/treatment/procedure(s) were performed by non-physician practitioner and as supervising physician I was immediately available for consultation/collaboration.  Zhion Pevehouse, MD 09/20/12 0404 

## 2012-09-20 NOTE — H&P (Signed)
32 yo M with typical sickle cell symptoms, being re admitted for uncontrolled pain.

## 2012-09-20 NOTE — ED Provider Notes (Signed)
CSN: [id number]     Arrival date & time 09/19/12  2313 History     First MD Initiated Contact with Patient 09/19/12 2333     Chief Complaint  Patient presents with  . Sickle Cell Pain Crisis    HPI  [name] is a 32 year old male with a PMH of sickle cell SS disease, hx of blood transfusion, hypokalemia, pulmonary embolism, AVN, chronic leukocytosis, chronic thrombocytosis, and HTN who presents to the ED for evaluation of a sickle cell pain crisis.  Patient states that he has been dealing with his sickle cell pain for about 1 week.  He states he has [facility]  He last was seen there on 09/18/12 for sickle cell pain.  He states they wanted to admit him, however, he declined.  He states his pain has been unchanged throughout the week.  His pain is located in his ribs bilaterally, lower back, and front of his legs.  He states he took 4 mg dilaudid at home around 6:00 pm with no relief in his symptoms.  He denies any trauma.  He also has some diffuse abdominal pain for the past few days.  He has has intermittent diarrhea off and on.  No rectal pain, rectal bleeding, constipation, nausea, or emesis.  He has had chest pain intermittently this week with no chest pain currently.  He states he has had clots in the lungs in the past, but he usually has pain with deep breathing "which I don't have today."  No SOB, wheezing, dyspnea, or cough.  He denies any fever, chills, change in appetite/activity, rhinorrhea, sore throat, numbness/tingling, loss of sensation/motor function, headache, dizziness, lightheadedness, or leg edema. He states he has been taking his other regular scheduled medications (Xarelto specifically) with no missed doses.     Past Medical History  Diagnosis Date  . Sickle cell anemia   . Blood transfusion   . 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 Hypokalemia   . Mood disorder   . Pulmonary embolism   .  Avascular necrosis   . Leukocytosis     Chronic  . Thrombocytosis     Chronic  . Hypertension   . C. difficile colitis    Past Surgical History  Procedure Laterality Date  . Right hip replacement      08/2006  . Cholecystectomy      01/2008  . Porta cath placement    . Porta cath removal    . Umbilical hernia repair      01/2008  . Excision of left periauricular cyst      10/2009  . Excision of right ear lobe cyst with primary closur      11/2007  . Portacath placement  01/05/2012    Procedure: INSERTION PORT-A-CATH;  Surgeon: Adolph Pollack, MD;  Location: [facility] OR;  Service: General;  Laterality: N/A;  ultrasound guiced port a cath insertion with fluoroscopy   Family History  Problem Relation Age of Onset  . Sickle cell anemia Mother   . Sickle cell anemia Father   . Sickle cell trait Brother    History  Substance Use Topics  . Smoking status: Former Smoker -- 13 years    Types: Cigarettes    Quit date: 07/08/2010  . Smokeless tobacco: Never Used  . Alcohol Use: No    Review of Systems  Constitutional: Negative for fever, chills, activity change, appetite change  and fatigue.  HENT: Negative for ear pain, congestion, sore throat, rhinorrhea, neck pain and neck stiffness.   Eyes: Negative for visual disturbance.  Respiratory: Negative for cough, shortness of breath and wheezing.   Cardiovascular: Negative for chest pain, palpitations and leg swelling.  Gastrointestinal: Positive for abdominal pain and diarrhea. Negative for nausea, vomiting, constipation, blood in stool and anal bleeding.  Genitourinary: Negative for dysuria and hematuria.  Musculoskeletal: Positive for back pain. Negative for joint swelling and gait problem.  Skin: Negative for rash and wound.  Neurological: Negative for dizziness, syncope, weakness, light-headedness, numbness and headaches.    Allergies  Morphine and related  Home Medications   Current Outpatient Rx  Name  Route  Sig   Dispense  Refill  . celecoxib (CELEBREX) 200 MG capsule   Oral   Take 1 capsule (200 mg total) by mouth 2 (two) times daily as needed for pain.   30 capsule   0   . folic acid (FOLVITE) 1 MG tablet   Oral   Take 1 tablet (1 mg total) by mouth every morning.   30 tablet   11   . HYDROmorphone (DILAUDID) 4 MG tablet   Oral   Take 1 tablet (4 mg total) by mouth every 4 (four) hours as needed for pain.   90 tablet   0   . hydroxyurea (HYDREA) 500 MG capsule   Oral   Take 2 capsules (1,000 mg total) by mouth daily. May take with food to minimize GI side effects.   60 capsule   1   . morphine (MS CONTIN) 15 MG 12 hr tablet   Oral   Take 1 tablet (15 mg total) by mouth 2 (two) times daily.   60 tablet   0   . potassium chloride SA (K-DUR,KLOR-CON) 20 MEQ tablet   Oral   Take 1 tablet (20 mEq total) by mouth every morning.   30 tablet   3   . Rivaroxaban (XARELTO) 20 MG TABS   Oral   Take 20 mg by mouth every morning.          . zolpidem (AMBIEN) 10 MG tablet   Oral   Take 1 tablet (10 mg total) by mouth at bedtime as needed for sleep. For sleep   30 tablet   1    BP 114/73  Pulse 96  Temp(Src) 99 F (37.2 C) (Oral)  Resp 18  Ht 6' (1.829 m)  Wt 173 lb (78.472 kg)  BMI 23.46 kg/m2  SpO2 97%  Filed Vitals:   09/19/12 2324 09/20/12 0131  BP: 114/73 104/62  Pulse: 96 59  Temp: 99 F (37.2 C)   TempSrc: Oral Oral  Resp: 18 18  Height: 6' (1.829 m)   Weight: 173 lb (78.472 kg)   SpO2: 97% 98%    Physical Exam  Nursing note and vitals reviewed. Constitutional: He is oriented to person, place, and time. He appears well-developed and well-nourished. No distress.  HENT:  Head: Normocephalic and atraumatic.  Right Ear: External ear normal.  Left Ear: External ear normal.  Nose: Nose normal.  Mouth/Throat: Oropharynx is clear and moist. No oropharyngeal exudate.  Eyes: Conjunctivae are normal. Pupils are equal, round, and reactive to light. Right eye  exhibits no discharge. Left eye exhibits no discharge. Scleral icterus is present.  Scleral icterus bilaterally   Neck: Normal range of motion. Neck supple.  Cardiovascular: Normal rate, regular rhythm, normal heart sounds and intact distal pulses.  Exam  reveals no gallop and no friction rub.   No murmur heard. Dorsalis pedis pulses present bilaterally  Pulmonary/Chest: Effort normal and breath sounds normal. No respiratory distress. He has no wheezes. He has no rales. He exhibits no tenderness.  No rib tenderness bilaterally   Abdominal: Soft. Bowel sounds are normal. He exhibits no distension and no mass. There is no tenderness. There is no rebound and no guarding.  Musculoskeletal: Normal range of motion. He exhibits no edema and no tenderness.  Strength 5/5 in the lower extremities bilaterally.  No lower back tenderness to palpation.  No anterior thigh tenderness to palpation.  No calf pain or swelling.  No leg edema throughout.    Neurological: He is alert and oriented to person, place, and time.  Gross sensation intact in the upper and lower extremities bilaterally  Skin: Skin is warm and dry. He is not diaphoretic.    ED Course   Procedures (including critical care time)  Labs Reviewed  CBC WITH DIFFERENTIAL  RETICULOCYTES  COMPREHENSIVE METABOLIC PANEL   No results found. No diagnosis found.      DG Chest 2 View (Final result)  Result time: 09/20/12 00:57:56    Final result by Rad Results In Interface (09/20/12 00:57:56)    Narrative:   *RADIOLOGY REPORT*  Clinical Data: Sickle cell, pain crisis  CHEST - 2 VIEW  Comparison: Prior radiograph from 09/17/2012  Findings: Left-sided Port-A-Cath is stable position with tip overlying the cavoatrial junction. Heart size is unchanged.  The lungs are normally inflated. No airspace consolidation, pleural effusion, or pulmonary edema is identified.  There is no pneumothorax.  No acute osseous  abnormality.  IMPRESSION: No acute cardiopulmonary process.   Original Report Authenticated By: Rise Mu, M.D.        Results for orders placed during the hospital encounter of 09/19/12  CBC WITH DIFFERENTIAL      Result Value Range   WBC 17.4 (*) 4.0 - 10.5 K/uL   RBC 2.47 (*) 4.22 - 5.81 MIL/uL   Hemoglobin 8.0 (*) 13.0 - 17.0 g/dL   HCT 16.1 (*) 09.6 - 04.5 %   MCV 94.7  78.0 - 100.0 fL   MCH 32.4  26.0 - 34.0 pg   MCHC 34.2  30.0 - 36.0 g/dL   RDW 40.9 (*) 81.1 - 91.4 %   Platelets 508 (*) 150 - 400 K/uL   Neutrophils Relative % 70  43 - 77 %   Neutro Abs 12.2 (*) 1.7 - 7.7 K/uL   Lymphocytes Relative 13  12 - 46 %   Lymphs Abs 2.3  0.7 - 4.0 K/uL   Monocytes Relative 13 (*) 3 - 12 %   Monocytes Absolute 2.3 (*) 0.1 - 1.0 K/uL   Eosinophils Relative 3  0 - 5 %   Eosinophils Absolute 0.5  0.0 - 0.7 K/uL   Basophils Relative 1  0 - 1 %   Basophils Absolute 0.1  0.0 - 0.1 K/uL  RETICULOCYTES      Result Value Range   Retic Ct Pct 12.6 (*) 0.4 - 3.1 %   RBC. 2.47 (*) 4.22 - 5.81 MIL/uL   Retic Count, Manual 311.2 (*) 19.0 - 186.0 K/uL  COMPREHENSIVE METABOLIC PANEL      Result Value Range   Sodium 138  135 - 145 mEq/L   Potassium 3.4 (*) 3.5 - 5.1 mEq/L   Chloride 103  96 - 112 mEq/L   CO2 26  19 - 32 mEq/L  Glucose, Bld 103 (*) 70 - 99 mg/dL   BUN 5 (*) 6 - 23 mg/dL   Creatinine, Ser 2.13  0.50 - 1.35 mg/dL   Calcium 8.6  8.4 - 08.6 mg/dL   Total Protein 6.9  6.0 - 8.3 g/dL   Albumin 3.6  3.5 - 5.2 g/dL   AST 32  0 - 37 U/L   ALT 23  0 - 53 U/L   Alkaline Phosphatase 85  39 - 117 U/L   Total Bilirubin 4.8 (*) 0.3 - 1.2 mg/dL   GFR calc non Af Amer >90  >90 mL/min   GFR calc Af Amer >90  >90 mL/min     MDM  [name] is a 32 year old male with a PMH of sickle cell SS disease, hx of blood transfusion, hypokalemia, pulmonary embolism, AVN, chronic leukocytosis, chronic thrombocytosis, and HTN who presents to the ED for evaluation of a  sickle cell pain crisis.   Rechecks  1:11 AM = No improvements in pain with 1 mg dilaudid.  States got medication about 15 minutes ago.  Will recheck in another 10 minutes.  Patient resting comfortably watching TV.  1 mg dilaudid ordered.   1:45 AM = No change in pain.  Waiting for second dose of Dilaudid.  Spoke to patient regarding admission, which he is in agreement with if he cannot get his pain well controlled.  Patient resting in no acute distress.    Previous labs reviewed  WBC = (17.4) --> 19.1, 17.0, 15.7  HGB = (8.0) --> 8.1, 8.2, 8.2  HCT = (23.4) -->  23.6, 23.5, 23.7  PLT = (508) --> 504, 529, 513  Rectic = (12.6) --> 12.4, 9.5, 7.7  T bili = (4.8) --> 6.7. 8.1, 8.0     1:52 AM = Signed out care to [facility] PA-C who will continue to monitor his progress.  Patient will continue to be observed in the emergency department.  He will likely need to be admitted if his pain is not well controlled. His labs appear to be at his baseline. His chest x-ray was negative for an acute cardiopulmonary process.      Greer Ee Sabrena Gavitt PA-C   Jillyn Ledger, PA-C 09/20/12 [phone]

## 2012-09-20 NOTE — ED Provider Notes (Signed)
Medical screening examination/treatment/procedure(s) were performed by non-physician practitioner and as supervising physician I was immediately available for consultation/collaboration.  Graviela Nodal, MD 09/20/12 0404 

## 2012-09-20 NOTE — ED Provider Notes (Signed)
Pt received from Palmar, PA-C.  Pt presented to ED w/ typical sickle cell pain crisis.  His hgb is stable and reticulocyte count elevated.  His CXR is negative for acute chest.  He has received 4mg  total of IV dilaudid as well as IV toradol and fluids.  His pain is minimally improved.  Will admit for further pain control.  [facility] consulted.  4:03 AM   Otilio Miu, PA-C 09/20/12 0403

## 2012-09-20 NOTE — H&P (Signed)
[name] is an 32 y.o. male with a PMHx of sickle cell anemia, blood transfusion, AVN a/p right hip replacement in 2008, PE on Xeralto, hypokalemia, mood disorder, chronic leukosytosis and thrombocytosis and HTN.    PCP: Dr. Marthann Schiller  Chief Complaint: back, bilateral thigh, and left rib cage pain unrelieved by home meds and ED meds HPI: [name] is a 32 yo AAM who presents to the ED with the above complaint. He was seen in the outpt sickle cell clinic on 09/17/12 and was encouraged to be admitted to hospital and refused. He then presented back to the clinic on 09/18/12 and was treated under OBS. He was d/c'd home from the clinic on an increased pain regimen at home, but still presents back to the [facility] ED tonight for continued unrelieved pain. He was told to take Dilaudid tabs 8mg  po q4hrs x 2-3 days, then go back to usual 4mg  q4hrs. He says he did this as well as taking his normal MS Contin, but pain continued to return at a 8/10. His goal pain for functioning at home is 2/10. Describes pain as throbbing, waxing and waning with pain meds. He has a hx of recent PE and states he is compliant with his Gibson Ramp. He denies chest pain, SOB or increased anxiety at this time. He states he had 3-4 loose stools on 09/19/12 and usually has chronic constipation. He denies an abd pain, n/v.  He also denies fever, cold symptoms, cough, dysuria, weakness, dizziness, HA, mood issues or pain in calves.  This NP explained to pt that his labs look better than they did 2 days ago and I didn't think he is in crisis. However, his pain has been unrelieved by normal and increased pain meds, so will admit him to OBS for pain control.  In ED, tests revealed a normal CXR, CMP normal except K of 3.4, CBC stable with chronic leukocytosis, Hgb stable as per baseline. UDS pending. Because of the above, [facility] are asked to admit pt for continued evaluation and treatment.  Note: Pt has allergy to Morphine IV but is  able to take Dilaudid without issues.   Past Medical History  Diagnosis Date  . Sickle cell anemia   . Blood transfusion   . 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 Hypokalemia   . Mood disorder   . Pulmonary embolism   . Avascular necrosis   . Leukocytosis     Chronic  . Thrombocytosis     Chronic  . Hypertension   . C. difficile colitis     Past Surgical History  Procedure Laterality Date  . Right hip replacement      08/2006  . Cholecystectomy      01/2008  . Porta cath placement    . Porta cath removal    . Umbilical hernia repair      01/2008  . Excision of left periauricular cyst      10/2009  . Excision of right ear lobe cyst with primary closur      11/2007  . Portacath placement  01/05/2012    Procedure: INSERTION PORT-A-CATH;  Surgeon: Adolph Pollack, MD;  Location: [facility] OR;  Service: General;  Laterality: N/A;  ultrasound guiced port a cath insertion with fluoroscopy    Family History  Problem Relation Age of Onset  . Sickle cell anemia Mother   . Sickle cell anemia Father   . Sickle cell trait Brother  Social History:  reports that he quit smoking about 2 years ago. His smoking use included Cigarettes. He smoked 0.00 packs per day for 13 years. He has never used smokeless tobacco. He reports that he does not drink alcohol or use illicit drugs.  Allergies:  Allergies  Allergen Reactions  . Morphine And Related Hives and Rash    Pt states he can take the pill form, but not IV and he is able to tolerate Dilaudid with no reactions.     Results for orders placed during the hospital encounter of 09/19/12 (from the past 48 hour(s))  CBC WITH DIFFERENTIAL     Status: Abnormal   Collection Time    09/20/12 12:50 AM      Result Value Range   WBC 17.4 (*) 4.0 - 10.5 K/uL   RBC 2.47 (*) 4.22 - 5.81 MIL/uL   Hemoglobin 8.0 (*) 13.0 - 17.0 g/dL   HCT 47.8 (*) 29.5 - 62.1 %   MCV 94.7  78.0 - 100.0 fL   MCH 32.4  26.0 - 34.0 pg    MCHC 34.2  30.0 - 36.0 g/dL   RDW 30.8 (*) 65.7 - 84.6 %   Platelets 508 (*) 150 - 400 K/uL   Neutrophils Relative % 70  43 - 77 %   Neutro Abs 12.2 (*) 1.7 - 7.7 K/uL   Lymphocytes Relative 13  12 - 46 %   Lymphs Abs 2.3  0.7 - 4.0 K/uL   Monocytes Relative 13 (*) 3 - 12 %   Monocytes Absolute 2.3 (*) 0.1 - 1.0 K/uL   Eosinophils Relative 3  0 - 5 %   Eosinophils Absolute 0.5  0.0 - 0.7 K/uL   Basophils Relative 1  0 - 1 %   Basophils Absolute 0.1  0.0 - 0.1 K/uL  RETICULOCYTES     Status: Abnormal   Collection Time    09/20/12 12:50 AM      Result Value Range   Retic Ct Pct 12.6 (*) 0.4 - 3.1 %   RBC. 2.47 (*) 4.22 - 5.81 MIL/uL   Retic Count, Manual 311.2 (*) 19.0 - 186.0 K/uL  COMPREHENSIVE METABOLIC PANEL     Status: Abnormal   Collection Time    09/20/12 12:50 AM      Result Value Range   Sodium 138  135 - 145 mEq/L   Potassium 3.4 (*) 3.5 - 5.1 mEq/L   Chloride 103  96 - 112 mEq/L   CO2 26  19 - 32 mEq/L   Glucose, Bld 103 (*) 70 - 99 mg/dL   BUN 5 (*) 6 - 23 mg/dL   Creatinine, Ser 9.62  0.50 - 1.35 mg/dL   Calcium 8.6  8.4 - 95.2 mg/dL   Total Protein 6.9  6.0 - 8.3 g/dL   Albumin 3.6  3.5 - 5.2 g/dL   AST 32  0 - 37 U/L   ALT 23  0 - 53 U/L   Alkaline Phosphatase 85  39 - 117 U/L   Total Bilirubin 4.8 (*) 0.3 - 1.2 mg/dL   GFR calc non Af Amer >90  >90 mL/min   GFR calc Af Amer >90  >90 mL/min   Comment: (NOTE)     The eGFR has been calculated using the CKD EPI equation.     This calculation has not been validated in all clinical situations.     eGFR's persistently <90 mL/min signify possible Chronic Kidney     Disease.  Dg Chest 2 View  09/20/2012   *RADIOLOGY REPORT*  Clinical Data: Sickle cell, pain crisis  CHEST - 2 VIEW  Comparison: Prior radiograph from 09/17/2012  Findings: Left-sided Port-A-Cath is stable position with tip overlying the cavoatrial junction.  Heart size is unchanged.  The lungs are normally inflated.  No airspace consolidation, pleural  effusion, or pulmonary edema is identified.  There is no pneumothorax.  No acute osseous abnormality.  IMPRESSION: No acute cardiopulmonary process.   Original Report Authenticated By: Rise Mu, M.D.    Review of Systems  Constitutional: Negative for fever, chills, weight loss, malaise/fatigue and diaphoresis.  HENT: Negative for ear pain, congestion, sore throat and ear discharge.   Eyes: Negative for blurred vision, pain, discharge and redness.  Respiratory: Negative for cough, sputum production, shortness of breath and wheezing.   Cardiovascular: Negative for chest pain, palpitations and leg swelling.  Gastrointestinal: Positive for diarrhea (4 loose stools on 09-19-12) and constipation (chronic constipation). Negative for heartburn, nausea and vomiting.  Genitourinary: Negative for dysuria, urgency and frequency.  Musculoskeletal: Positive for myalgias (thighs), back pain and joint pain (left rib cage pain).  Skin: Negative for itching and rash.  Neurological: Negative for dizziness, tingling, sensory change, focal weakness, weakness and headaches.  Endo/Heme/Allergies: Negative for environmental allergies and polydipsia. Does not bruise/bleed easily.  Psychiatric/Behavioral: Negative for depression and suicidal ideas. The patient is not nervous/anxious.     Blood pressure 105/69, pulse 59, temperature 98.4 F (36.9 C), temperature source Oral, resp. rate 18, height 6' (1.829 m), weight 78.472 kg (173 lb), SpO2 95.00%. Physical Exam  Constitutional: He is oriented to person, place, and time. He appears well-developed and well-nourished. No distress.  Appears well. NAD  HENT:  Head: Normocephalic and atraumatic.  Right Ear: External ear normal.  Left Ear: External ear normal.  Nose: Nose normal.  Mouth/Throat: Oropharynx is clear and moist. No oropharyngeal exudate.  Eyes: Conjunctivae are normal. Pupils are equal, round, and reactive to light. Right eye exhibits no  discharge. Left eye exhibits no discharge. Scleral icterus is present.  Neck: Normal range of motion. Neck supple. No thyromegaly present.  Cardiovascular: Normal rate, regular rhythm and intact distal pulses.  Exam reveals no gallop and no friction rub.   Murmur heard. Respiratory: Breath sounds normal. No stridor. No respiratory distress. He has no wheezes. He has no rales. He exhibits no tenderness.  GI: Soft. Bowel sounds are normal. He exhibits no distension. There is no tenderness. There is no rebound and no guarding.  Genitourinary:  deferred  Musculoskeletal: Normal range of motion. He exhibits no edema and no tenderness.  Lymphadenopathy:    He has no cervical adenopathy.  Neurological: He is alert and oriented to person, place, and time. No cranial nerve deficit.  PERRL. Grossly intact, no focal deficit  Skin: Skin is warm and dry. No rash noted. He is not diaphoretic. No erythema.  Psychiatric: He has a normal mood and affect. His behavior is normal.     Assessment/Plan 1. Sickle cell anemia/disease with pain unrelieved by increased and normal pain regimen. Check UDS. Will continue MS Contin, add Dilaudid IV, Toradol, Kpad, and IV hydration. Hold home Celebrex in light of Toradol. Continue Folate and Hydroxyurea. I believe he will be able to leave sometime today. F/up in outpt sickle cell clinic.  2. Leukocytosis-chronic, but he has had loose stools and will monitor. Hx of Cdiff, but no recent abx. No signs of infection on exam.  3. AVN-stable 4. Icterus-his  LFTs are normal. 5. HTN-? diagnosis, he is not on meds. 6. Mood disorder-stable 7. Hx PE-cont Xeralto 8. VTE-as above and early ambulation.  9. FULL CODE.  No family with pt.  Plan discussed with Dr. Julian Reil who agrees and will cosign and edit note as needed.  Time on admission-35 mins.   Jimmye Norman 09/20/2012, 4:48 AM

## 2012-09-20 NOTE — Progress Notes (Signed)
SICKLE CELL SERVICE PROGRESS NOTE  [name] [name] DOB: May 26, 1980 DOA: 09/19/2012 PCP: Marylynn Rigdon A., MD  Assessment/Plan: Active Problems:   Sickle cell anemia with pain  1. Sickle Cell Pain crisis: Pt states that he has been having pain in his ribs and legs. His pain is 7/10 and that his oral medications have been ineffective. He has not been maximally hydrating and I have discussed this with the patient. He will increase oral fluid intake and I will increase clinician assisted doses  to 3 mg every 2 hours. He will likely need a higher dose of short acting medications to get through his acute vaso-occlusive episode. Will reassess for discharge this evening. 2. H/O PE: Continue Xarelto.  Code Status: Full Code Family Communication: N/A Disposition Plan: Home   [name] A.  Pager [phone]. If 7PM-7AM, please contact night-coverage.  09/20/2012, 9:34 AM  LOS: 1 day   Brief narrative: [name] is a 32 yo AAM who presents to the ED with the above complaint. He was seen in the outpt sickle cell clinic on 09/17/12 and was encouraged to be admitted to hospital and refused. He then presented back to the clinic on 09/18/12 and was treated under OBS. He was d/c'd home from the clinic on an increased pain regimen at home, but still presents back to the [facility] ED tonight for continued unrelieved pain. He was told to take Dilaudid tabs 8mg  po q4hrs x 2-3 days, then go back to usual 4mg  q4hrs. He says he did this as well as taking his normal MS Contin, but pain continued to return at a 8/10. His goal pain for functioning at home is 2/10. Describes pain as throbbing, waxing and waning with pain meds. He has a hx of recent PE and states he is compliant with his Gibson Ramp. He denies chest pain, SOB or increased anxiety at this time. He states he had 3-4 loose stools on 09/19/12 and usually has chronic constipation. He denies an abd pain, n/v.  He also denies fever, cold symptoms, cough,  dysuria, weakness, dizziness, HA, mood issues or pain in calves.  This NP explained to pt that his labs look better than they did 2 days ago and I didn't think he is in crisis. However, his pain has been unrelieved by normal and increased pain meds, so will admit him to OBS for pain control.  In ED, tests revealed a normal CXR, CMP normal except K of 3.4, CBC stable with chronic leukocytosis, Hgb stable as per baseline. UDS pending.  Because of the above, [facility] are asked to admit pt for continued evaluation and treatment.   Consultants:  None  Procedures:  None  Antibiotics:  None  HPI/Subjective: Pt states that he has been having pain in his ribs and legs. His pain is 7/10 and that his oral medications have been ineffective. He has not been maximally hydrating but now has a better understanding of what is reasonable hydration.  Objective: Filed Vitals:   09/20/12 0131 09/20/12 0445 09/20/12 0500 09/20/12 0737  BP: 104/62 105/69 115/74 107/70  Pulse: 59 59 63 52  Temp:  98.4 F (36.9 C) 98.5 F (36.9 C) 98.2 F (36.8 C)  TempSrc: Oral Oral Oral Oral  Resp: 18 18 18 16   Height:      Weight:   171 lb 12.8 oz (77.928 kg)   SpO2: 98% 95% 99% 99%   Weight change:   Intake/Output Summary (Last 24 hours) at 09/20/12 8295 Last data filed at  09/20/12 0600  Gross per 24 hour  Intake  78.33 ml  Output      0 ml  Net  78.33 ml    General: Alert, awake, oriented x3, in no acute distress.  HEENT: [place]/AT PEERL, EOMI, anicteric Neck: Trachea midline,  no masses, no thyromegal,y no JVD, no carotid bruit Heart: Regular rate and rhythm, without murmurs, rubs, gallops, PMI non-displaced, no heaves or thrills on palpation.  Lungs: Clear to auscultation, no wheezing or rhonchi noted. No increased vocal fremitus resonant to percussion  Abdomen: Soft, nontender, nondistended, positive bowel sounds, no masses no hepatosplenomegaly noted.  Neuro: No focal neurological deficits  noted cranial nerves II through XII grossly intact. Strength noral in bilateral upper and lower extremities. Musculoskeletal: No warm swelling or erythema around joints, no spinal tenderness noted. Psychiatric: Patient alert and oriented x3, good insight and cognition, good recent to remote recall.   Data Reviewed: Basic Metabolic Panel:  Recent Labs Lab 09/15/12 1308 09/17/12 0928 09/18/12 1200 09/20/12 0050  NA 136 137 137 138  K 3.5 3.7 3.8 3.4*  CL 101 105 105 103  CO2 24 23 24 26   GLUCOSE 96 112* 102* 103*  BUN 5* 6 7 5*  CREATININE 0.51 0.56 0.50 0.55  CALCIUM 9.1 8.8 8.7 8.6   Liver Function Tests:  Recent Labs Lab 09/15/12 1308 09/17/12 0928 09/18/12 1200 09/20/12 0050  AST 30 38* 35 32  ALT 18 23 24 23   ALKPHOS 75 86 82 85  BILITOT 8.0* 8.1* 6.7* 4.8*  PROT 7.3 7.1 7.0 6.9  ALBUMIN 4.0 3.8 3.7 3.6   CBC:  Recent Labs Lab 09/15/12 1308 09/17/12 0928 09/18/12 1200 09/20/12 0050  WBC 15.7* 17.0* 19.1* 17.4*  NEUTROABS 11.0* 12.4* 13.2* 12.2*  HGB 8.2* 8.2* 8.1* 8.0*  HCT 23.7* 23.5* 23.6* 23.4*  MCV 93.3 93.6 94.4 94.7  PLT 512* 529* 504* 508*   Studies: Dg Chest 2 View  09/20/2012   *RADIOLOGY REPORT*  Clinical Data: Sickle cell, pain crisis  CHEST - 2 VIEW  Comparison: Prior radiograph from 09/17/2012  Findings: Left-sided Port-A-Cath is stable position with tip overlying the cavoatrial junction.  Heart size is unchanged.  The lungs are normally inflated.  No airspace consolidation, pleural effusion, or pulmonary edema is identified.  There is no pneumothorax.  No acute osseous abnormality.  IMPRESSION: No acute cardiopulmonary process.   Original Report Authenticated By: Rise Mu, M.D.   Dg Chest 2 View  09/17/2012   *RADIOLOGY REPORT*  Clinical Data: Shortness of breath  CHEST - 2 VIEW  Comparison: 09/10/2012  Findings: There is a left chest wall porta-catheter with tip in the cavoatrial junction.  Heart size appears mildly enlarged.  No  pleural effusion or edema identified.  No airspace consolidation noted.  Mild spondylosis identified within the thoracic spine.  IMPRESSION:  1.  No acute cardiopulmonary abnormalities.   Original Report Authenticated By: Signa Kell, M.D.     Scheduled Meds: . folic acid  1 mg Oral q morning - 10a  . hydroxyurea  1,000 mg Oral Daily  . ketorolac  30 mg Intravenous Q8H  . morphine  15 mg Oral BID  . potassium chloride SA  20 mEq Oral q morning - 10a  . Rivaroxaban  20 mg Oral q morning - 10a   Continuous Infusions: . dextrose 5 % and 0.45% NaCl 100 mL/hr at 09/20/12 0513    Time spent 34 minutes

## 2012-09-21 LAB — CBC WITH DIFFERENTIAL/PLATELET
Basophils Absolute: 0.2 10*3/uL — ABNORMAL HIGH (ref 0.0–0.1)
HCT: 21.5 % — ABNORMAL LOW (ref 39.0–52.0)
Lymphocytes Relative: 26 % (ref 12–46)
Neutro Abs: 7.9 10*3/uL — ABNORMAL HIGH (ref 1.7–7.7)
Neutrophils Relative %: 52 % (ref 43–77)
Platelets: 475 10*3/uL — ABNORMAL HIGH (ref 150–400)
RDW: 18.7 % — ABNORMAL HIGH (ref 11.5–15.5)
WBC: 15.2 10*3/uL — ABNORMAL HIGH (ref 4.0–10.5)

## 2012-09-21 LAB — LACTATE DEHYDROGENASE: LDH: 372 U/L — ABNORMAL HIGH (ref 94–250)

## 2012-09-21 MED ORDER — POTASSIUM CHLORIDE IN NACL 20-0.9 MEQ/L-% IV SOLN
INTRAVENOUS | Status: DC
  Administered 2012-09-21 – 2012-09-23 (×4): via INTRAVENOUS
  Filled 2012-09-21 (×6): qty 1000

## 2012-09-21 MED ORDER — SODIUM CHLORIDE 0.9 % IJ SOLN
10.0000 mL | INTRAMUSCULAR | Status: DC | PRN
  Administered 2012-09-21 – 2012-09-23 (×2): 10 mL

## 2012-09-21 MED ORDER — HYDROMORPHONE HCL PF 2 MG/ML IJ SOLN
4.0000 mg | INTRAMUSCULAR | Status: DC | PRN
  Administered 2012-09-21 – 2012-09-23 (×25): 4 mg via INTRAVENOUS
  Filled 2012-09-21 (×25): qty 2

## 2012-09-21 NOTE — Progress Notes (Addendum)
Subjective: Patient seen. He states he continues to pain on his bilateral sides he states this is his typical sickle cell pain.  He ranks his pain as 7/10, which he states is unchanged from yesterday. After narcotics as pain goes down to 5/10 but quickly rebounds within 2 hours. He uses narcotics every 2 hours as scheduled. He denies any nausea, vomiting, burning on urination. He denies any coughing or constipation.     Objective: Weight change:   Intake/Output Summary (Last 24 hours) at 09/21/12 0951 Last data filed at 09/21/12 0950  Gross per 24 hour  Intake   2690 ml  Output   1000 ml  Net   1690 ml   BP 113/80  Pulse 67  Temp(Src) 98.7 F (37.1 C) (Oral)  Resp 20  Ht 6' (1.829 m)  Wt 77.928 kg (171 lb 12.8 oz)  BMI 23.3 kg/m2  SpO2 99%  General Appearance:    Alert, cooperative, no distress, appears stated age  Head:    Normocephalic, without obvious abnormality, atraumatic  Eyes:    PERRL, conjunctiva/corneas clear,           Nose:   Nares normal, septum midline, mucosa normal, no drainage    or sinus tenderness  Throat:   Lips, mucosa, and tongue normal; teeth and gums normal  Neck:   Supple, symmetrical, trachea midline, no adenopathy;       thyroid:  No enlargement/tenderness/nodules; no carotid   bruit or JVD  Back:     Symmetric, no curvature, ROM normal, no CVA tenderness  Lungs:     Clear to auscultation bilaterally, respirations unlabored  Chest wall:    No tenderness or deformity, Mediport left chest wall   Heart:    Regular rate and rhythm, S1 and S2 normal, no murmur, rub   or gallop  Abdomen:     Soft, non-tender, bowel sounds active all four quadrants,    no masses, no organomegaly, no flank pain appreciated         Extremities:   Extremities normal, atraumatic, no cyanosis or edema  Pulses:   2+ and symmetric all extremities  Skin:   Skin color, texture, turgor normal, no rashes or lesions  Lymph nodes:   Cervical, supraclavicular, and axillary nodes  normal  Neurologic:   CNII-XII intact. Normal strength, sensation and reflexes      throughout    Lab Results:  Recent Labs  09/20/12 0050 09/20/12 0915  NA 138 136  K 3.4* 3.4*  CL 103 104  CO2 26 26  GLUCOSE 103* 117*  BUN 5* 6  CREATININE 0.55 0.60  CALCIUM 8.6 8.2*    Recent Labs  09/18/12 1200 09/20/12 0050  AST 35 32  ALT 24 23  ALKPHOS 82 85  BILITOT 6.7* 4.8*  PROT 7.0 6.9  ALBUMIN 3.7 3.6   No results found for this basename: LIPASE, AMYLASE,  in the last 72 hours  Recent Labs  09/20/12 0050 09/20/12 0915 09/21/12 0430  WBC 17.4* 14.4* 15.2*  NEUTROABS 12.2*  --  7.9*  HGB 8.0* 7.3* 7.1*  HCT 23.4* 21.8* 21.5*  MCV 94.7 94.4 94.3  PLT 508* 477* 475*    Recent Labs  09/18/12 1200 09/20/12 0050  RETICCTPCT 12.4* 12.6*    Micro Results: No results found for this or any previous visit (from the past 240 hour(s)).  Studies/Results: Dg Chest 2 View  09/20/2012   *RADIOLOGY REPORT*  Clinical Data: Sickle cell, pain crisis  CHEST -  2 VIEW  Comparison: Prior radiograph from 09/17/2012  Findings: Left-sided Port-A-Cath is stable position with tip overlying the cavoatrial junction.  Heart size is unchanged.  The lungs are normally inflated.  No airspace consolidation, pleural effusion, or pulmonary edema is identified.  There is no pneumothorax.  No acute osseous abnormality.  IMPRESSION: No acute cardiopulmonary process.   Original Report Authenticated By: Rise Mu, M.D.   Dg Chest 2 View  09/17/2012   *RADIOLOGY REPORT*  Clinical Data: Shortness of breath  CHEST - 2 VIEW  Comparison: 09/10/2012  Findings: There is a left chest wall porta-catheter with tip in the cavoatrial junction.  Heart size appears mildly enlarged.  No pleural effusion or edema identified.  No airspace consolidation noted.  Mild spondylosis identified within the thoracic spine.  IMPRESSION:  1.  No acute cardiopulmonary abnormalities.   Original Report Authenticated By:  Signa Kell, M.D.   Dg Chest 2 View  09/10/2012   *RADIOLOGY REPORT*  Clinical Data: Sickle cell pain crisis.  CHEST - 2 VIEW  Comparison: 08/18/2012.  Findings: Unchanged appearance of left subclavian portacatheter, tip at the superior cavoatrial junction.  Stable osseous findings, including bilateral proximal humeral osteonecrosis.  Chronic cardiomegaly.  Unremarkable upper mediastinal contours.  Coarsened interstitial markings without edema or infiltrate.  No effusion or pneumothorax.  IMPRESSION:  Chronic interstitial changes and cardiomegaly.  No evidence of acute cardiopulmonary disease.   Original Report Authenticated By: Tiburcio Pea   Ct Angio Chest Pe W/cm &/or Wo Cm  08/27/2012   **ADDENDUM** CREATED: 08/27/2012 17:11:37  Comment:  I communicated this report directly by phone to Dr. Ashley Royalty at the [facility], August 27, 2012, 05:11 p.m.  **END ADDENDUM** SIGNED BY: Rutherford Guys. Margarita Grizzle, M.D.  08/27/2012   *RADIOLOGY REPORT*  Clinical Data: Shortness of breath  CT ANGIOGRAPHY CHEST  Technique:  Multidetector CT imaging of the chest using the standard protocol during bolus administration of intravenous contrast. Multiplanar reconstructed images including MIPs were obtained and reviewed to evaluate the vascular anatomy.  Contrast: OMNIPAQUE IOHEXOL 350 MG/ML SOLN  Comparison:  Chest radiograph August 18, 2012 and chest CT angiogram March 01, 2012  Findings: There is a pulmonary embolus in a peripheral branch of the superior   right lower lobe superior segment pulmonary artery. There is no thoracic aortic aneurysm or dissection.  There is patchy infiltrate in the anterior and lateral segments of the left lower lobe.  There is also patchy infiltrate in the lateral segment right lower lobe.  There is patchy bibasilar atelectasis.  There is also mild atelectatic change in the posterior segment right upper lobe.  There is mild scarring in the anterior segment left upper lobe medially.  There  is a small area of infiltrate in the anterior segment left upper lobe on slice 27.  There are multiple lymph nodes throughout the mediastinum, largest adjacent to the aortic arch.  The largest of these lymph nodes measures 1 x 0.9 cm.  By size criteria, there is no frank adenopathy.  Port-A-Cath tip is at the cavoatrial junction.  Heart is enlarged.  Pericardium is not thickened.  Visualized upper abdominal structures appear normal except for diffuse calcification in the spleen.  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  IMPRESSION: Small pulmonary embolus in a branch of the superior segment right lower lobe pulmonary artery.  No other pulmonary emboli identified.  Bony changes of sickle cell disease.  Splenic changes of  sickle cell disease.  Areas of patchy infiltrate bilaterally.  Stable small mediastinal lymph nodes which are stable.   Original Report Authenticated By: Bretta Bang, M.D.   Medications: Scheduled Meds: . folic acid  1 mg Oral q morning - 10a  . hydroxyurea  1,000 mg Oral Daily  . ketorolac  30 mg Intravenous Q8H  . morphine  15 mg Oral BID  . potassium chloride SA  20 mEq Oral q morning - 10a  . Rivaroxaban  20 mg Oral q morning - 10a   Continuous Infusions: . dextrose 5 % and 0.45% NaCl 100 mL/hr at 09/21/12 0945   PRN Meds:.diphenhydrAMINE, HYDROmorphone (DILAUDID) injection, sodium chloride  Assessment/Plan: @PROBHOSP @  LOS: 2 days   Active Problems:  Sickle cell anemia with pain  1. Sickle Cell Pain crisis: Pt states that he has been having pain in his ribs and legs. His pain is 7/10 and that his oral medications have been ineffective. I will increase clinician assisted doses to 4 mg every 2 hours and add some IV fluid hydration. He will likely need a higher dose of short acting medications to get through his acute vaso-occlusive episode. Will reassess for discharge in a.m.  . 2.  Leukocytosis:  Slightly increased today, will order urinalysis. 3. H/O PE: Continue Xarelto.   Code Status: Full Code  DVT prophylaxis: No need for DVT prophylaxis as patient's on Xarelto Family Communication: N/A  Disposition Plan: Home    Shadi Larner 09/21/2012, 9:51 AM

## 2012-09-21 NOTE — Progress Notes (Signed)
[id number] Earlene Plater, RN, BSN, CCM [phone] Chart Reviewed for discharge and hospital needs. Discharge needs at time of review:  None Next review due on [id number].

## 2012-09-22 LAB — CBC
Platelets: 487 10*3/uL — ABNORMAL HIGH (ref 150–400)
RBC: 2.23 MIL/uL — ABNORMAL LOW (ref 4.22–5.81)
RDW: 18.8 % — ABNORMAL HIGH (ref 11.5–15.5)
WBC: 17.5 10*3/uL — ABNORMAL HIGH (ref 4.0–10.5)

## 2012-09-22 LAB — LACTATE DEHYDROGENASE: LDH: 421 U/L — ABNORMAL HIGH (ref 94–250)

## 2012-09-22 LAB — URINALYSIS, ROUTINE W REFLEX MICROSCOPIC
Nitrite: NEGATIVE
Specific Gravity, Urine: 1.011 (ref 1.005–1.030)
Urobilinogen, UA: 1 mg/dL (ref 0.0–1.0)

## 2012-09-22 LAB — BASIC METABOLIC PANEL
CO2: 25 mEq/L (ref 19–32)
Calcium: 8.6 mg/dL (ref 8.4–10.5)
Chloride: 104 mEq/L (ref 96–112)
GFR calc Af Amer: >90 mL/min (ref >90)
Sodium: 134 mEq/L — ABNORMAL LOW (ref 135–145)

## 2012-09-22 MED ORDER — SODIUM CHLORIDE 0.9 % IV SOLN
25.0000 mg | INTRAVENOUS | Status: DC | PRN
Start: 2012-09-22 — End: 2012-09-23
  Filled 2012-09-22: qty 0.5

## 2012-09-22 MED ORDER — KETOROLAC TROMETHAMINE 30 MG/ML IJ SOLN
30.0000 mg | Freq: Three times a day (TID) | INTRAMUSCULAR | Status: DC
  Administered 2012-09-22 – 2012-09-23 (×4): 30 mg via INTRAVENOUS
  Filled 2012-09-22 (×7): qty 1

## 2012-09-22 NOTE — Progress Notes (Signed)
Subjective: Patient seen, continues to complain of pain in side. Pain is improved by comparison to yesterday now 5/10. Status function is improved. Denies any shortness of breath, he does some mild nonproductive cough.  Objective: Weight change:   Intake/Output Summary (Last 24 hours) at 09/22/12 1012 Last data filed at 09/22/12 0900  Gross per 24 hour  Intake 2461.66 ml  Output   2325 ml  Net 136.66 ml   BP 121/83  Pulse 79  Temp(Src) 98.6 F (37 C) (Oral)  Resp 20  Ht 6' (1.829 m)  Wt 77.928 kg (171 lb 12.8 oz)  BMI 23.3 kg/m2  SpO2 92%  General Appearance:    Alert, cooperative, no distress, appears stated age  Head:    Normocephalic, without obvious abnormality, atraumatic  Eyes:    PERRL, conjunctiva/corneas clear,         Nose:   Nares normal, septum midline, mucosa normal, no drainage    or sinus tenderness  Throat:   Lips, mucosa, and tongue normal; teeth and gums normal  Neck:   Supple, symmetrical, trachea midline, no adenopathy;       thyroid:  No enlargement/tenderness/nodules; no carotid   bruit or JVD  Back:     Symmetric, no curvature, ROM normal, no CVA tenderness  Lungs:     Clear to auscultation bilaterally, respirations unlabored  Chest wall:    No tenderness or deformity, Mediport in place left chest wall   Heart:    Regular rate and rhythm, S1 and S2 normal, no murmur, rub   or gallop  Abdomen:     Soft, non-tender, bowel sounds active all four quadrants,    no masses, no organomegaly        Extremities:   Extremities normal, atraumatic, no cyanosis or edema  Pulses:   2+ and symmetric all extremities  Skin:   Skin color, texture, turgor normal, no rashes or lesions  Lymph nodes:   Cervical, supraclavicular, and axillary nodes normal  Neurologic:   CNII-XII intact. Normal strength, sensation and reflexes      throughout    Lab Results:  Recent Labs  09/20/12 0915 09/22/12 0557  NA 136 134*  K 3.4* 3.9  CL 104 104  CO2 26 25  GLUCOSE 117*  98  BUN 6 6  CREATININE 0.60 0.56  CALCIUM 8.2* 8.6    Recent Labs  09/20/12 0050  AST 32  ALT 23  ALKPHOS 85  BILITOT 4.8*  PROT 6.9  ALBUMIN 3.6   No results found for this basename: LIPASE, AMYLASE,  in the last 72 hours  Recent Labs  09/20/12 0050  09/21/12 0430 09/22/12 0557  WBC 17.4*  < > 15.2* 17.5*  NEUTROABS 12.2*  --  7.9*  --   HGB 8.0*  < > 7.1* 7.1*  HCT 23.4*  < > 21.5* 20.8*  MCV 94.7  < > 94.3 93.3  PLT 508*  < > 475* 487*  < > = values in this interval not displayed. No results found for this basename: CKTOTAL, CKMB, CKMBINDEX, TROPONINI,  in the last 72 hours No components found with this basename: POCBNP,  No results found for this basename: DDIMER,  in the last 72 hours No results found for this basename: HGBA1C,  in the last 72 hours No results found for this basename: CHOL, HDL, LDLCALC, TRIG, CHOLHDL, LDLDIRECT,  in the last 72 hours No results found for this basename: TSH, T4TOTAL, FREET3, T3FREE, THYROIDAB,  in the last 72  hours  Recent Labs  09/20/12 0050  RETICCTPCT 12.6*    Micro Results: No results found for this or any previous visit (from the past 240 hour(s)).  Studies/Results: Dg Chest 2 View  09/20/2012   *RADIOLOGY REPORT*  Clinical Data: Sickle cell, pain crisis  CHEST - 2 VIEW  Comparison: Prior radiograph from 09/17/2012  Findings: Left-sided Port-A-Cath is stable position with tip overlying the cavoatrial junction.  Heart size is unchanged.  The lungs are normally inflated.  No airspace consolidation, pleural effusion, or pulmonary edema is identified.  There is no pneumothorax.  No acute osseous abnormality.  IMPRESSION: No acute cardiopulmonary process.   Original Report Authenticated By: Rise Mu, M.D.   Dg Chest 2 View  09/17/2012   *RADIOLOGY REPORT*  Clinical Data: Shortness of breath  CHEST - 2 VIEW  Comparison: 09/10/2012  Findings: There is a left chest wall porta-catheter with tip in the cavoatrial  junction.  Heart size appears mildly enlarged.  No pleural effusion or edema identified.  No airspace consolidation noted.  Mild spondylosis identified within the thoracic spine.  IMPRESSION:  1.  No acute cardiopulmonary abnormalities.   Original Report Authenticated By: Signa Kell, M.D.   Dg Chest 2 View  09/10/2012   *RADIOLOGY REPORT*  Clinical Data: Sickle cell pain crisis.  CHEST - 2 VIEW  Comparison: 08/18/2012.  Findings: Unchanged appearance of left subclavian portacatheter, tip at the superior cavoatrial junction.  Stable osseous findings, including bilateral proximal humeral osteonecrosis.  Chronic cardiomegaly.  Unremarkable upper mediastinal contours.  Coarsened interstitial markings without edema or infiltrate.  No effusion or pneumothorax.  IMPRESSION:  Chronic interstitial changes and cardiomegaly.  No evidence of acute cardiopulmonary disease.   Original Report Authenticated By: Tiburcio Pea   Ct Angio Chest Pe W/cm &/or Wo Cm  08/27/2012   **ADDENDUM** CREATED: 08/27/2012 17:11:37  Comment:  I communicated this report directly by phone to Dr. Ashley Royalty at the [facility], August 27, 2012, 05:11 p.m.  **END ADDENDUM** SIGNED BY: Rutherford Guys. Margarita Grizzle, M.D.  08/27/2012   *RADIOLOGY REPORT*  Clinical Data: Shortness of breath  CT ANGIOGRAPHY CHEST  Technique:  Multidetector CT imaging of the chest using the standard protocol during bolus administration of intravenous contrast. Multiplanar reconstructed images including MIPs were obtained and reviewed to evaluate the vascular anatomy.  Contrast: OMNIPAQUE IOHEXOL 350 MG/ML SOLN  Comparison:  Chest radiograph August 18, 2012 and chest CT angiogram March 01, 2012  Findings: There is a pulmonary embolus in a peripheral branch of the superior   right lower lobe superior segment pulmonary artery. There is no thoracic aortic aneurysm or dissection.  There is patchy infiltrate in the anterior and lateral segments of the left lower lobe.   There is also patchy infiltrate in the lateral segment right lower lobe.  There is patchy bibasilar atelectasis.  There is also mild atelectatic change in the posterior segment right upper lobe.  There is mild scarring in the anterior segment left upper lobe medially.  There is a small area of infiltrate in the anterior segment left upper lobe on slice 27.  There are multiple lymph nodes throughout the mediastinum, largest adjacent to the aortic arch.  The largest of these lymph nodes measures 1 x 0.9 cm.  By size criteria, there is no frank adenopathy.  Port-A-Cath tip is at the cavoatrial junction.  Heart is enlarged.  Pericardium is not thickened.  Visualized upper abdominal structures appear normal except for diffuse calcification in the spleen.  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  IMPRESSION: Small pulmonary embolus in a branch of the superior segment right lower lobe pulmonary artery.  No other pulmonary emboli identified.  Bony changes of sickle cell disease.  Splenic changes of sickle cell disease.  Areas of patchy infiltrate bilaterally.  Stable small mediastinal lymph nodes which are stable.   Original Report Authenticated By: Bretta Bang, M.D.   Medications: Scheduled Meds: . folic acid  1 mg Oral q morning - 10a  . hydroxyurea  1,000 mg Oral Daily  . morphine  15 mg Oral BID  . Rivaroxaban  20 mg Oral q morning - 10a   Continuous Infusions: . 0.9 % NaCl with KCl 20 mEq / L 100 mL/hr at 09/22/12 0758  . dextrose 5 % and 0.45% NaCl Stopped (09/21/12 1204)   PRN Meds:.diphenhydrAMINE, HYDROmorphone (DILAUDID) injection, sodium chloride  Assessment/Plan: @PROBHOSP @  LOS: 3 days   Active Problems:  Sickle cell anemia with pain  1. Sickle Cell Pain crisis: Pt states and still with pain, will continue current scheduled dose of Dilaudid, resume Toradol Q8 hours for 2 more days and encouraged use of heating pad.   Continue IV fluid hydration. Reassess for discharge in a.m. . 2. Leukocytosis: Slightly increased today, will order urinalysis normal. On review of past labs patient has a chronic leukocytosis, continue to monitor.  3. H/O PE: Continue Xarelto.   Code Status: Full Code  DVT prophylaxis: No need for DVT prophylaxis as patient's on Xarelto  Family Communication: N/A  Disposition Plan: Home  in a.m.    Lindamarie Maclachlan 09/22/2012, 10:12 AM

## 2012-09-23 LAB — CBC
HCT: 21.4 % — ABNORMAL LOW (ref 39.0–52.0)
RDW: 19.4 % — ABNORMAL HIGH (ref 11.5–15.5)
WBC: 17.4 10*3/uL — ABNORMAL HIGH (ref 4.0–10.5)

## 2012-09-23 LAB — BASIC METABOLIC PANEL
BUN: 9 mg/dL (ref 6–23)
Chloride: 105 mEq/L (ref 96–112)
GFR calc Af Amer: >90 mL/min (ref >90)
GFR calc non Af Amer: >90 mL/min (ref >90)
Potassium: 4.1 mEq/L (ref 3.5–5.1)
Sodium: 137 mEq/L (ref 135–145)

## 2012-09-23 MED ORDER — HEPARIN SOD (PORK) LOCK FLUSH 100 UNIT/ML IV SOLN
500.0000 [IU] | INTRAVENOUS | Status: AC | PRN
  Administered 2012-09-23: 500 [IU]

## 2012-09-23 NOTE — Discharge Summary (Signed)
Physician Discharge Summary  [name] [name] DOB: 04-10-1980 DOA: 09/19/2012  PCP: [name],[name], MD  Admit date: 09/19/2012 Discharge date: 09/23/2012  Discharge Diagnoses:  Active Problems:   Sickle cell anemia with pain   Discharge Condition: Stable  Disposition:  Follow-up Information   Follow up with [name],[name], MD In 3 days.   Specialty:  Internal Medicine   Contact information:   [address] [place] [address] [phone]       Diet: Regular Wt Readings from Last 3 Encounters:  09/20/12 77.928 kg (171 lb 12.8 oz)  09/10/12 78.472 kg (173 lb)  08/29/12 76.476 kg (168 lb 9.6 oz)    Hospital Course:  This is a 32 year old gentleman was admitted with the acute pain due to sickle cell disease. He was treated with IV pain medications as well as home medications resumed. IV fluid hydration, Toradol and a heating pad, today the patient states his pain is much improved and is ready for discharge. He is to follow up with Dr Ashley Royalty office in 3 days.  Discharge Exam: Filed Vitals:   09/23/12 0554  BP: 123/77  Pulse: 71  Temp: 99.1 F (37.3 C)  Resp: 18   Filed Vitals:   09/22/12 1410 09/22/12 2240 09/23/12 0212 09/23/12 0554  BP: 134/85 133/77 126/82 123/77  Pulse: 82 88  71  Temp: 99 F (37.2 C) 98.3 F (36.8 C) 98.6 F (37 C) 99.1 F (37.3 C)  TempSrc: Oral Oral Oral Oral  Resp: 20 18 18 18   Height:      Weight:      SpO2: 94% 98% 96% 96%   General: Alert and oriented in no acute distress Cardiovascular: Regular to rhythm without murmurs regurg or gallops Respiratory: No wheeze no rhonchi good air exchange Abdomen: Soft possible sounds nontender nondistended Extremities: No clubbing cyanosis or edema  Discharge Instructions     Medication List         celecoxib 200 MG capsule  Commonly known as:  CELEBREX  Take 1 capsule (200 mg total) by mouth 2 (two) times daily as needed for pain.     folic  acid 1 MG tablet  Commonly known as:  FOLVITE  Take 1 tablet (1 mg total) by mouth every morning.     HYDROmorphone 4 MG tablet  Commonly known as:  DILAUDID  Take 1 tablet (4 mg total) by mouth every 4 (four) hours as needed for pain.     hydroxyurea 500 MG capsule  Commonly known as:  HYDREA  Take 2 capsules (1,000 mg total) by mouth daily. May take with food to minimize GI side effects.     morphine 15 MG 12 hr tablet  Commonly known as:  MS CONTIN  Take 1 tablet (15 mg total) by mouth 2 (two) times daily.     potassium chloride SA 20 MEQ tablet  Commonly known as:  K-DUR,KLOR-CON  Take 1 tablet (20 mEq total) by mouth every morning.     XARELTO 20 MG Tabs tablet  Generic drug:  Rivaroxaban  Take 20 mg by mouth every morning.     zolpidem 10 MG tablet  Commonly known as:  AMBIEN  Take 1 tablet (10 mg total) by mouth at bedtime as needed for sleep. For sleep          The results of significant diagnostics from this hospitalization (including imaging, microbiology, ancillary and laboratory) are listed below for reference.    Significant Diagnostic  Studies: Dg Chest 2 View  09/20/2012   *RADIOLOGY REPORT*  Clinical Data: Sickle cell, pain crisis  CHEST - 2 VIEW  Comparison: Prior radiograph from 09/17/2012  Findings: Left-sided Port-A-Cath is stable position with tip overlying the cavoatrial junction.  Heart size is unchanged.  The lungs are normally inflated.  No airspace consolidation, pleural effusion, or pulmonary edema is identified.  There is no pneumothorax.  No acute osseous abnormality.  IMPRESSION: No acute cardiopulmonary process.   Original Report Authenticated By: Rise Mu, M.D.   Dg Chest 2 View  09/17/2012   *RADIOLOGY REPORT*  Clinical Data: Shortness of breath  CHEST - 2 VIEW  Comparison: 09/10/2012  Findings: There is a left chest wall porta-catheter with tip in the cavoatrial junction.  Heart size appears mildly enlarged.  No pleural effusion or  edema identified.  No airspace consolidation noted.  Mild spondylosis identified within the thoracic spine.  IMPRESSION:  1.  No acute cardiopulmonary abnormalities.   Original Report Authenticated By: Signa Kell, M.D.   Dg Chest 2 View  09/10/2012   *RADIOLOGY REPORT*  Clinical Data: Sickle cell pain crisis.  CHEST - 2 VIEW  Comparison: 08/18/2012.  Findings: Unchanged appearance of left subclavian portacatheter, tip at the superior cavoatrial junction.  Stable osseous findings, including bilateral proximal humeral osteonecrosis.  Chronic cardiomegaly.  Unremarkable upper mediastinal contours.  Coarsened interstitial markings without edema or infiltrate.  No effusion or pneumothorax.  IMPRESSION:  Chronic interstitial changes and cardiomegaly.  No evidence of acute cardiopulmonary disease.   Original Report Authenticated By: Tiburcio Pea   Ct Angio Chest Pe W/cm &/or Wo Cm  08/27/2012   **ADDENDUM** CREATED: 08/27/2012 17:11:37  Comment:  I communicated this report directly by phone to Dr. Ashley Royalty at the [facility], August 27, 2012, 05:11 p.m.  **END ADDENDUM** SIGNED BY: Rutherford Guys. Margarita Grizzle, M.D.  08/27/2012   *RADIOLOGY REPORT*  Clinical Data: Shortness of breath  CT ANGIOGRAPHY CHEST  Technique:  Multidetector CT imaging of the chest using the standard protocol during bolus administration of intravenous contrast. Multiplanar reconstructed images including MIPs were obtained and reviewed to evaluate the vascular anatomy.  Contrast: OMNIPAQUE IOHEXOL 350 MG/ML SOLN  Comparison:  Chest radiograph August 18, 2012 and chest CT angiogram March 01, 2012  Findings: There is a pulmonary embolus in a peripheral branch of the superior   right lower lobe superior segment pulmonary artery. There is no thoracic aortic aneurysm or dissection.  There is patchy infiltrate in the anterior and lateral segments of the left lower lobe.  There is also patchy infiltrate in the lateral segment right lower lobe.   There is patchy bibasilar atelectasis.  There is also mild atelectatic change in the posterior segment right upper lobe.  There is mild scarring in the anterior segment left upper lobe medially.  There is a small area of infiltrate in the anterior segment left upper lobe on slice 27.  There are multiple lymph nodes throughout the mediastinum, largest adjacent to the aortic arch.  The largest of these lymph nodes measures 1 x 0.9 cm.  By size criteria, there is no frank adenopathy.  Port-A-Cath tip is at the cavoatrial junction.  Heart is enlarged.  Pericardium is not thickened.  Visualized upper abdominal structures appear normal except for diffuse calcification in the spleen.  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  IMPRESSION: Small pulmonary embolus in a branch of the superior segment right  lower lobe pulmonary artery.  No other pulmonary emboli identified.  Bony changes of sickle cell disease.  Splenic changes of sickle cell disease.  Areas of patchy infiltrate bilaterally.  Stable small mediastinal lymph nodes which are stable.   Original Report Authenticated By: Bretta Bang, M.D.    Microbiology: No results found for this or any previous visit (from the past 240 hour(s)).   Labs: Basic Metabolic Panel:  Recent Labs Lab 09/18/12 1200 09/20/12 0050 09/20/12 0915 09/22/12 0557 09/23/12 0400  NA 137 138 136 134* 137  K 3.8 3.4* 3.4* 3.9 4.1  CL 105 103 104 104 105  CO2 24 26 26 25 24   GLUCOSE 102* 103* 117* 98 90  BUN 7 5* 6 6 9   CREATININE 0.50 0.55 0.60 0.56 0.54  CALCIUM 8.7 8.6 8.2* 8.6 9.0   Liver Function Tests:  Recent Labs Lab 09/17/12 0928 09/18/12 1200 09/20/12 0050  AST 38* 35 32  ALT 23 24 23   ALKPHOS 86 82 85  BILITOT 8.1* 6.7* 4.8*  PROT 7.1 7.0 6.9  ALBUMIN 3.8 3.7 3.6   No results found for this basename: LIPASE, AMYLASE,  in the last 168 hours No results found for this basename:  AMMONIA,  in the last 168 hours CBC:  Recent Labs Lab 09/17/12 0928 09/18/12 1200 09/20/12 0050 09/20/12 0915 09/21/12 0430 09/22/12 0557 09/23/12 0400  WBC 17.0* 19.1* 17.4* 14.4* 15.2* 17.5* 17.4*  NEUTROABS 12.4* 13.2* 12.2*  --  7.9*  --   --   HGB 8.2* 8.1* 8.0* 7.3* 7.1* 7.1* 7.2*  HCT 23.5* 23.6* 23.4* 21.8* 21.5* 20.8* 21.4*  MCV 93.6 94.4 94.7 94.4 94.3 93.3 93.4  PLT 529* 504* 508* 477* 475* 487* 458*   Cardiac Enzymes: No results found for this basename: CKTOTAL, CKMB, CKMBINDEX, TROPONINI,  in the last 168 hours BNP: No components found with this basename: POCBNP,  CBG: No results found for this basename: GLUCAP,  in the last 168 hours Ferritin: No results found for this basename: FERRITIN,  in the last 168 hours  Time coordinating discharge: 35 minutes  Signed:  Orvil Faraone  09/23/2012, 9:05 AM

## 2012-09-23 NOTE — Progress Notes (Signed)
Discharge instructions explained to pt. And copy of instructions given to pt. Pt states he understands.

## 2012-09-23 NOTE — Progress Notes (Signed)
Received report from K. Ward, RN. No change since initial assessment. Will continue to monitor and follow plan of care.

## 2012-09-25 IMAGING — CR DG CERVICAL SPINE COMPLETE 4+V
7 series · 7 of 7 positions shown · non-contrast
Comparison: None.

CLINICAL DATA: The left neck pain with left arm numbness.  No known
injury

CERVICAL SPINE - COMPLETE 4+ VIEW

[w c-spine lat]
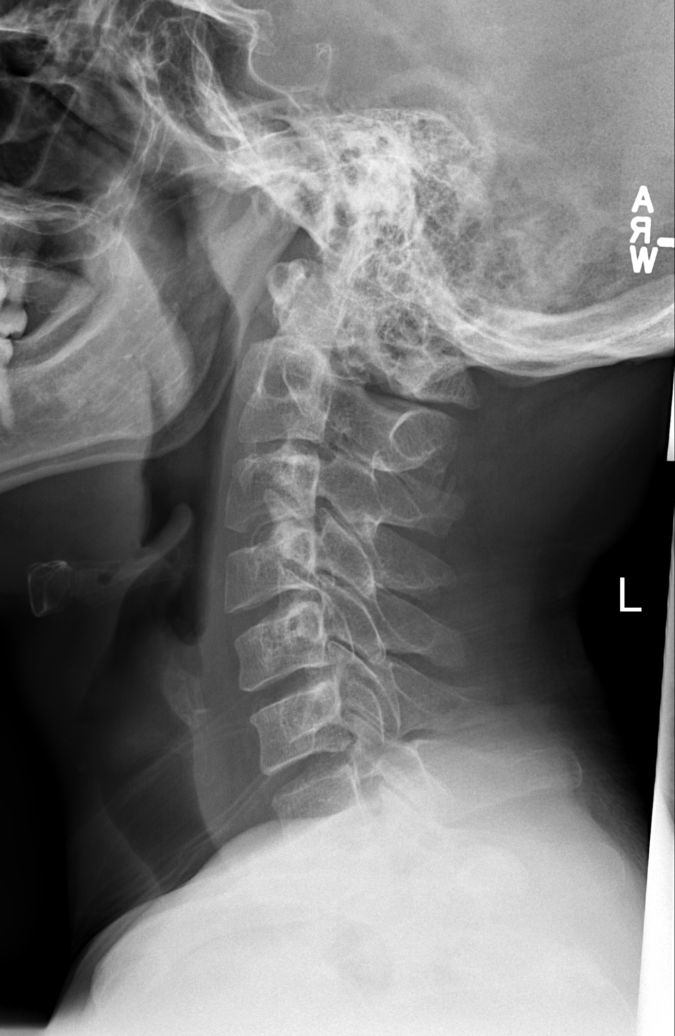

[w c-spine oblique (1 of 2)]
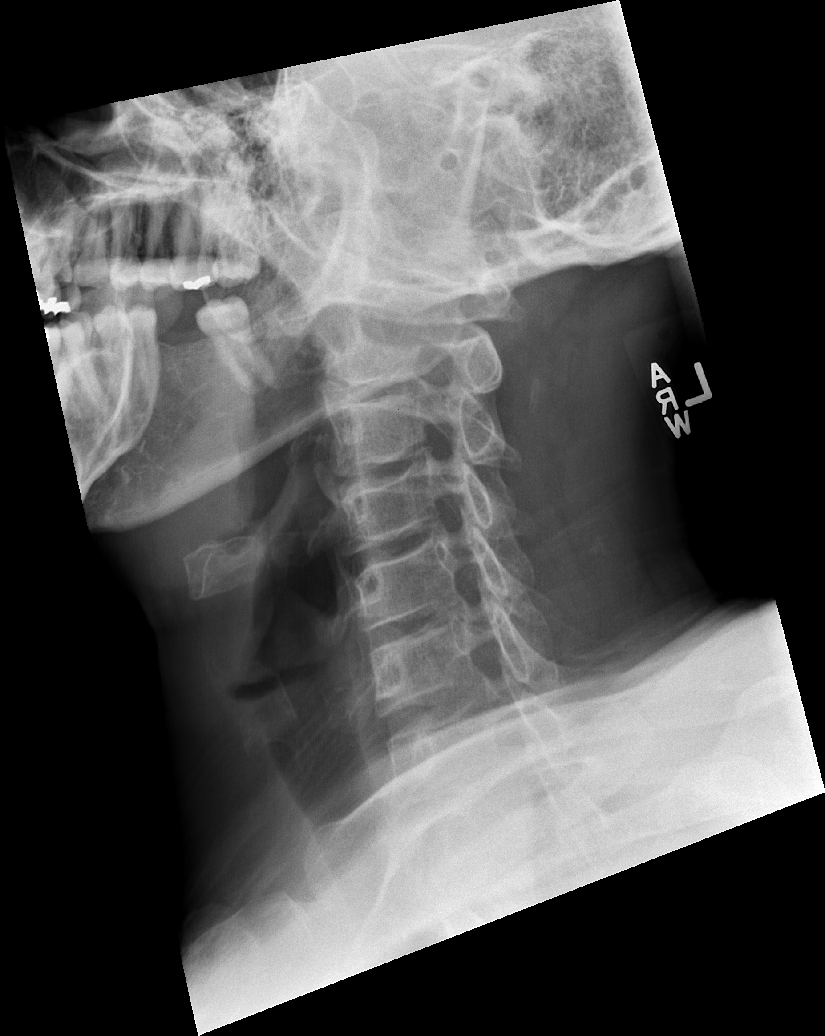

[w c-spine oblique (2 of 2)]
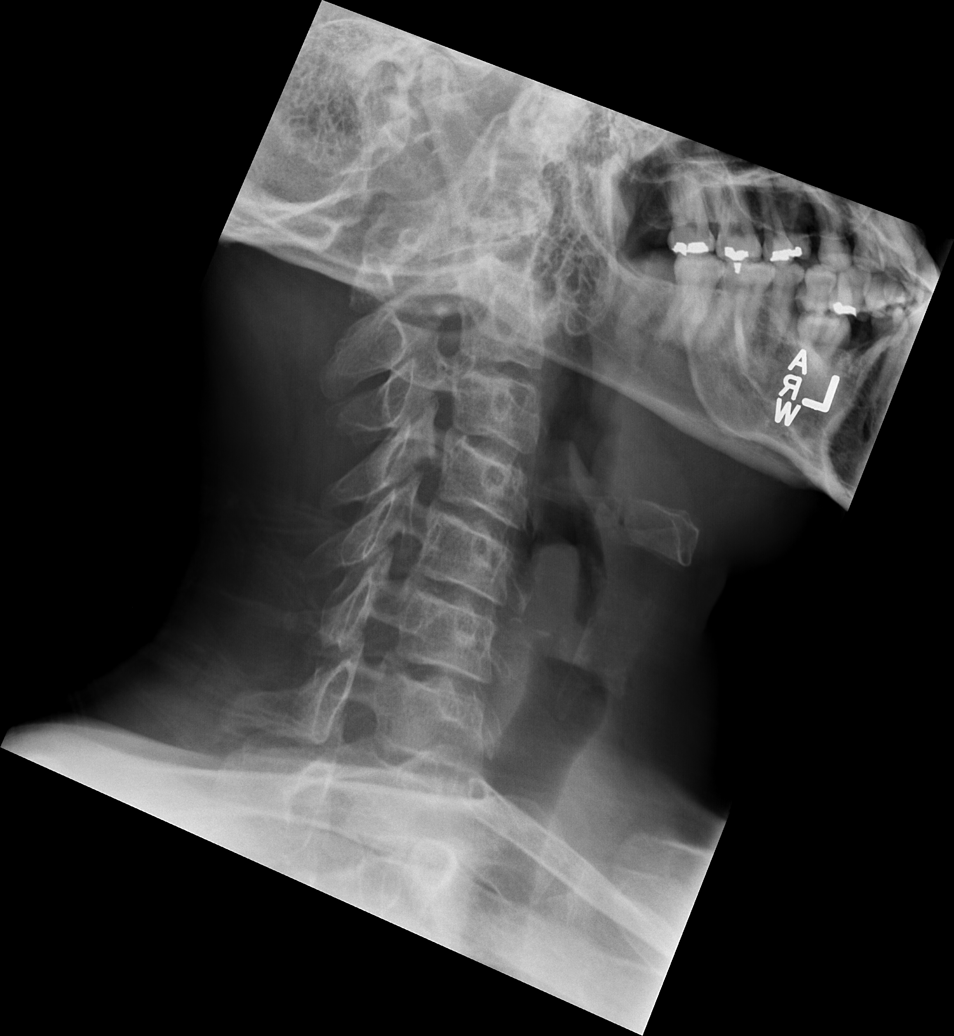

[w c-spine a.p. *]
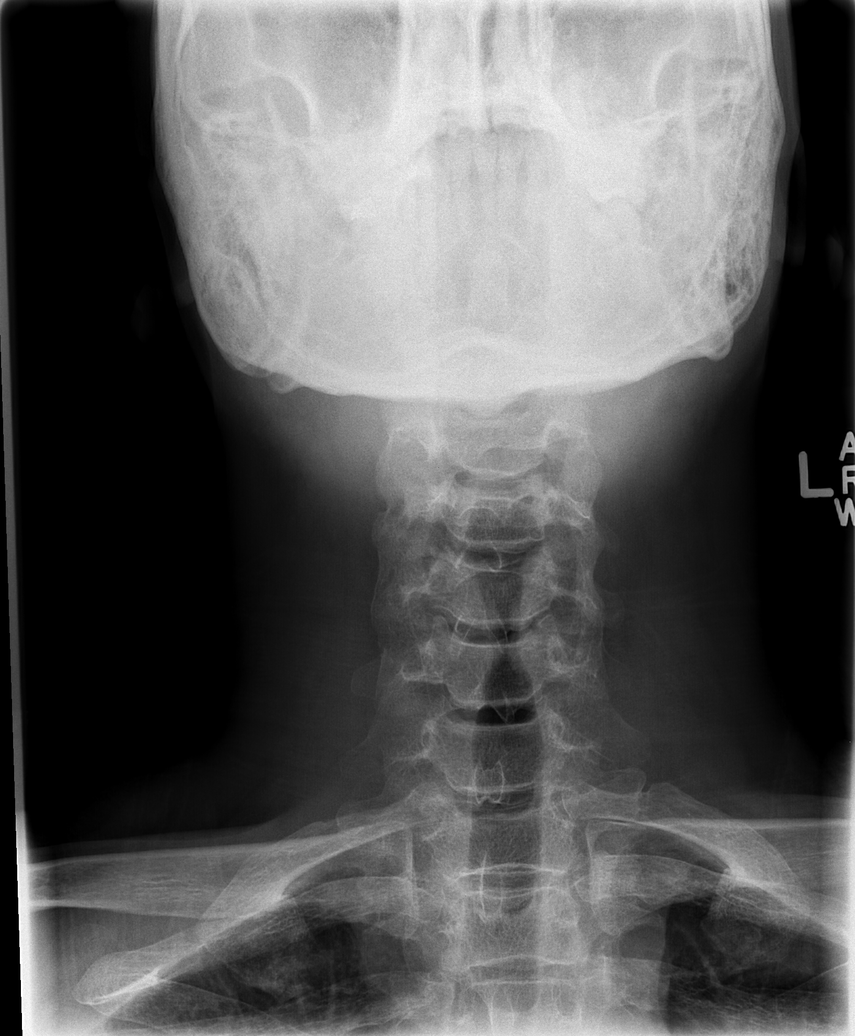

[w c-spine odontoid * (1 of 2)]
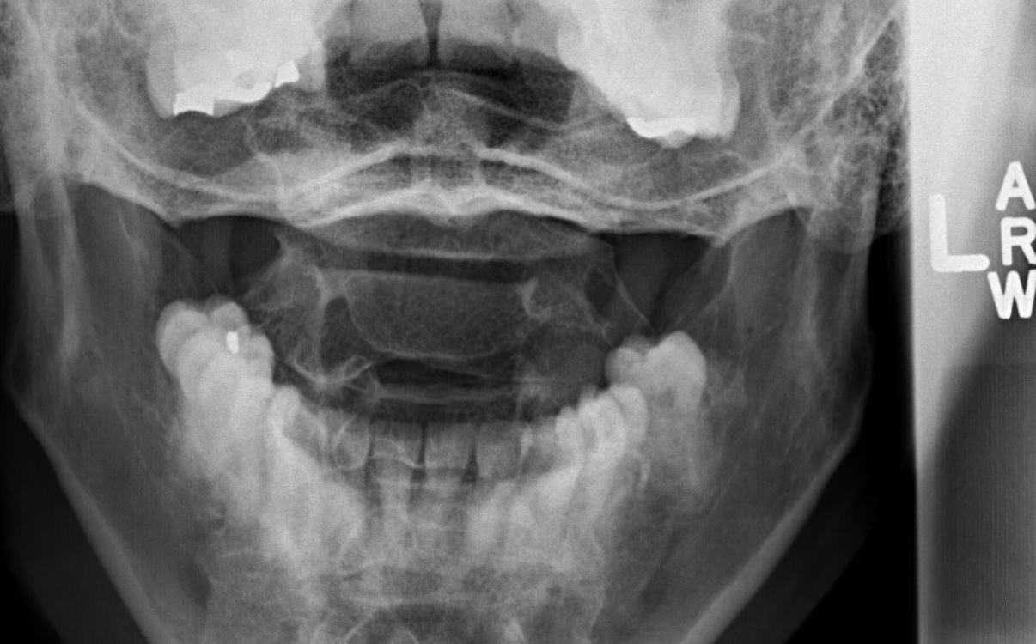

[w c-spine odontoid * (2 of 2)]
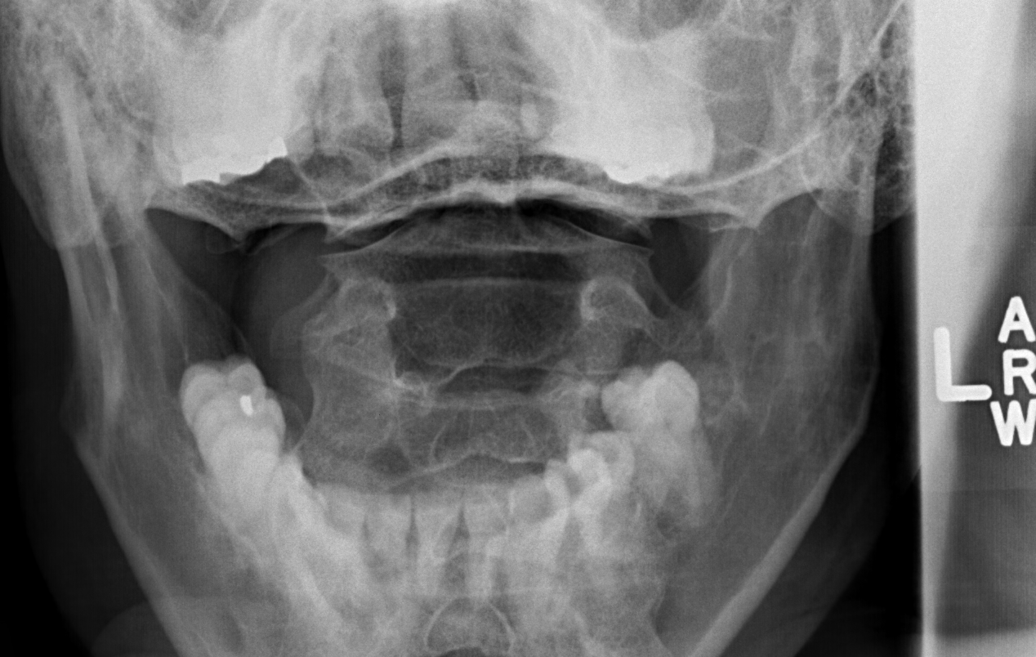

[w swimmers view *]
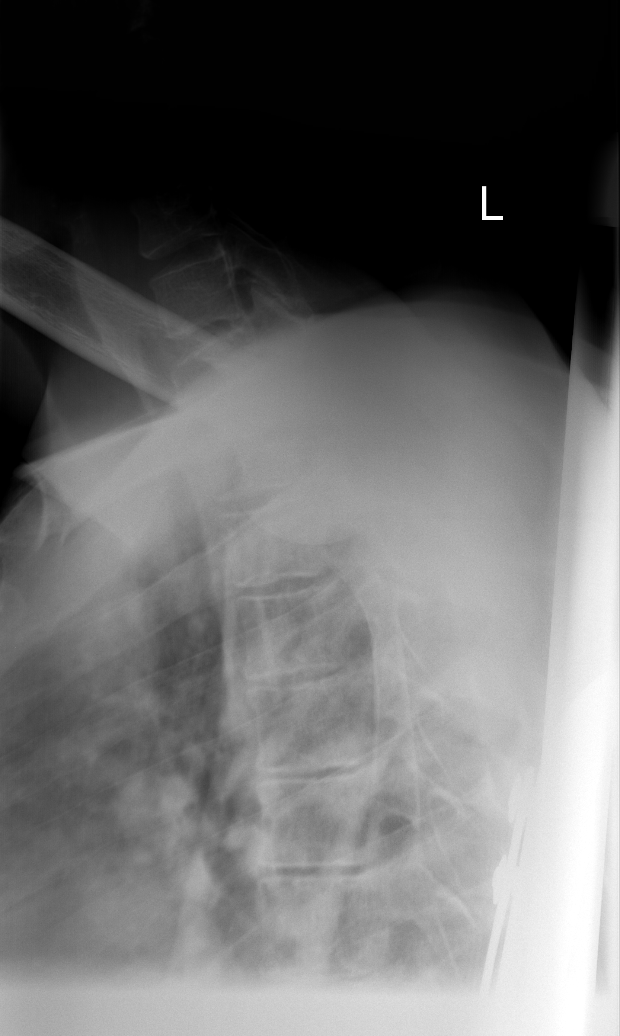

[7 of 7 positions shown; findings below may reference images not displayed]

FINDINGS: Routine views plus a lateral swimmer's view of the
cervicothoracic junction were obtained.

No subluxation or fractures.  Disc height preserved.  Neural
foramina widely patent.  Prevertebral soft tissues normal.
IMPRESSION: No acute or significant findings.

## 2012-09-26 ENCOUNTER — Encounter: Payer: Self-pay | Admitting: Internal Medicine

## 2012-09-26 ENCOUNTER — Ambulatory Visit (INDEPENDENT_AMBULATORY_CARE_PROVIDER_SITE_OTHER): Payer: Medicare Other | Admitting: Primary Care

## 2012-09-26 ENCOUNTER — Emergency Department (HOSPITAL_COMMUNITY)
Admission: EM | Admit: 2012-09-26 | Discharge: 2012-09-27 | Disposition: A | Discharge status: Home / Self Care | Payer: Medicare Other | Attending: Emergency Medicine | Admitting: Emergency Medicine

## 2012-09-26 ENCOUNTER — Encounter (HOSPITAL_COMMUNITY): Payer: Self-pay | Admitting: Emergency Medicine

## 2012-09-26 VITALS — BP 103/64 | HR 72 | Temp 98.5°F | Resp 14 | Ht 72.0 in | Wt 172.0 lb

## 2012-09-26 DIAGNOSIS — D57819 Other sickle-cell disorders with crisis, unspecified: Secondary | ICD-10-CM | POA: Diagnosis not present

## 2012-09-26 DIAGNOSIS — Z8659 Personal history of other mental and behavioral disorders: Secondary | ICD-10-CM | POA: Insufficient documentation

## 2012-09-26 DIAGNOSIS — Z862 Personal history of diseases of the blood and blood-forming organs and certain disorders involving the immune mechanism: Secondary | ICD-10-CM | POA: Insufficient documentation

## 2012-09-26 DIAGNOSIS — G4701 Insomnia due to medical condition: Secondary | ICD-10-CM

## 2012-09-26 DIAGNOSIS — Z8639 Personal history of other endocrine, nutritional and metabolic disease: Secondary | ICD-10-CM | POA: Insufficient documentation

## 2012-09-26 DIAGNOSIS — M549 Dorsalgia, unspecified: Secondary | ICD-10-CM | POA: Diagnosis not present

## 2012-09-26 DIAGNOSIS — Z8739 Personal history of other diseases of the musculoskeletal system and connective tissue: Secondary | ICD-10-CM | POA: Insufficient documentation

## 2012-09-26 DIAGNOSIS — Z7901 Long term (current) use of anticoagulants: Secondary | ICD-10-CM | POA: Insufficient documentation

## 2012-09-26 DIAGNOSIS — J40 Bronchitis, not specified as acute or chronic: Secondary | ICD-10-CM | POA: Diagnosis not present

## 2012-09-26 DIAGNOSIS — Z79899 Other long term (current) drug therapy: Secondary | ICD-10-CM | POA: Diagnosis not present

## 2012-09-26 DIAGNOSIS — Z8619 Personal history of other infectious and parasitic diseases: Secondary | ICD-10-CM | POA: Diagnosis not present

## 2012-09-26 DIAGNOSIS — G4700 Insomnia, unspecified: Secondary | ICD-10-CM | POA: Diagnosis not present

## 2012-09-26 DIAGNOSIS — I10 Essential (primary) hypertension: Secondary | ICD-10-CM | POA: Diagnosis not present

## 2012-09-26 DIAGNOSIS — Z87891 Personal history of nicotine dependence: Secondary | ICD-10-CM | POA: Diagnosis not present

## 2012-09-26 DIAGNOSIS — D5700 Hb-SS disease with crisis, unspecified: Secondary | ICD-10-CM | POA: Diagnosis not present

## 2012-09-26 DIAGNOSIS — Z86711 Personal history of pulmonary embolism: Secondary | ICD-10-CM | POA: Diagnosis not present

## 2012-09-26 DIAGNOSIS — Z9889 Other specified postprocedural states: Secondary | ICD-10-CM | POA: Diagnosis not present

## 2012-09-26 DIAGNOSIS — Z86718 Personal history of other venous thrombosis and embolism: Secondary | ICD-10-CM | POA: Diagnosis not present

## 2012-09-26 MED ORDER — CELECOXIB 200 MG PO CAPS: 200.0000 mg | ORAL_CAPSULE | Freq: Two times a day (BID) | ORAL | Status: DC | PRN

## 2012-09-26 MED ORDER — ZOLPIDEM TARTRATE 10 MG PO TABS: 10.0000 mg | ORAL_TABLET | Freq: Every evening | ORAL | Status: DC | PRN

## 2012-09-26 NOTE — ED Notes (Signed)
Pt c/o severe back pain, onset last night. Pt states this feels like SS pain. Pt has no pain meds at home.

## 2012-09-26 NOTE — Progress Notes (Signed)
Patient ID: [name], male   DOB: 10-Jan-1981, 32 y.o.   MRN: [id number] SICKLE CELL SERVICE PROGRESS NOTE  PCP: Marthann Schiller, MD 09/26/2012   Chief Complaint  Patient presents with  . Follow-up    medication refill  . Back Pain    since last night    Subjective: [name] is in today for a hospital follow up  and medication refills.   He has a PMH of PE , mood disorder and HTN. He has lower back pain which is chronic 5/10 and his baseline is 2/10. There are palliative or provacative factors he is able to associate his low back pain with.   Review of Systems  Constitutional: Negative.   HENT:       Positive icteric sclera   Cardiovascular: Negative.   Gastrointestinal: Negative for vomiting.       1 episode self limited unclear cause Denies reflux  Genitourinary: Negative.   Musculoskeletal: Positive for back pain.       Low back  Skin: Negative.   Neurological: Negative.   Endo/Heme/Allergies: Negative.   Psychiatric/Behavioral: Negative.      Allergies  Allergen Reactions  . Morphine And Related Hives and Rash    Pt states he can take the pill form, but not IV and he is able to tolerate Dilaudid with no reactions.     Outpatient Encounter Prescriptions as of 09/26/2012  Medication Sig Dispense Refill  . celecoxib (CELEBREX) 200 MG capsule Take 1 capsule (200 mg total) by mouth 2 (two) times daily as needed for pain.  30 capsule  0  . folic acid (FOLVITE) 1 MG tablet Take 1 tablet (1 mg total) by mouth every morning.  30 tablet  11  . HYDROmorphone (DILAUDID) 4 MG tablet Take 1 tablet (4 mg total) by mouth every 4 (four) hours as needed for pain.  90 tablet  0  . hydroxyurea (HYDREA) 500 MG capsule Take 2 capsules (1,000 mg total) by mouth daily. May take with food to minimize GI side effects.  60 capsule  1  . morphine (MS CONTIN) 15 MG 12 hr tablet Take 1 tablet (15 mg total) by mouth 2 (two) times daily.  60 tablet  0  . potassium chloride SA  (K-DUR,KLOR-CON) 20 MEQ tablet Take 1 tablet (20 mEq total) by mouth every morning.  30 tablet  3  . Rivaroxaban (XARELTO) 20 MG TABS Take 20 mg by mouth every morning.       . zolpidem (AMBIEN) 10 MG tablet Take 1 tablet (10 mg total) by mouth at bedtime as needed for sleep. For sleep  30 tablet  1  . [EXPIRED] levofloxacin (LEVAQUIN) 750 MG tablet        No facility-administered encounter medications on file as of 09/26/2012.   Physical Exam   Filed Vitals:   09/26/12 1359  BP: 103/64  Pulse: 72  Temp: 98.5 F (36.9 C)  Resp: 14    General: Alert, awake, oriented x3, in no acute distress.  HEENT: [place]/AT PEERL, EOMI, right ear cerumen impaction  Neck: Trachea midline,  no masses, no thyromegal,y no JVD, no carotid bruit OROPHARYNX:  Moist, No exudate/ erythema/lesions.  Heart: Regular rate and rhythm, without murmurs, rubs, gallops, PMI non-displaced, no heaves or thrills on palpation.  Lungs: Clear to auscultation, no wheezing or rhonchi noted. No  vocal fremitus  Abdomen: Soft, nontender, nondistended, positive bowel sounds, no masses no hepatosplenomegaly noted..  Neuro: No focal neurological deficits noted cranial  nerves II through XII grossly intact.  Musculoskeletal: No warm swelling or erythema around joints, no spinal tenderness noted. Porta cath Left upper chest  Psychiatric: Patient alert and oriented x3, good insight and cognition Lymph node survey: No cervical axillary lymphadenopathy noted.   Assessment/Plan:  Hb SS: stable at this time. Continue mantence medication Folic acid 1mg  QD and hydroxyurea 1000mg  QD, encourage increase water intake recc. 64oz an hr with the intiation of crisis.  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 Acute on chronic pain: Continue Ms Contin  15mg  bid and hydromorphone 4 mg Q 4hrs. Pt acknowledges he has resumed to  taking 1 Q 4hrs. Adjunct therapy added for inflammatory process Celebrex 200mg  bid this may also improve  chronic low back pain  Hx of hypokalemia: stable continue Kdur 20 meq QD Insomnia: cont Ambien 10mg  Qhr prn  Health Maintenance:  Eye exam needed information given to pt to make an appt and explained importance of exam  Echo completed 08/30/12 May need spirometry    Greater than 50% of time spent in counseling and discussion about health maintenance   [name], NP-C

## 2012-09-27 ENCOUNTER — Other Ambulatory Visit: Payer: Self-pay | Admitting: Primary Care

## 2012-09-27 ENCOUNTER — Telehealth: Payer: Self-pay | Admitting: Internal Medicine

## 2012-09-27 ENCOUNTER — Emergency Department (HOSPITAL_COMMUNITY): Payer: Medicare Other

## 2012-09-27 ENCOUNTER — Ambulatory Visit (INDEPENDENT_AMBULATORY_CARE_PROVIDER_SITE_OTHER): Payer: Medicare Other

## 2012-09-27 DIAGNOSIS — D571 Sickle-cell disease without crisis: Secondary | ICD-10-CM

## 2012-09-27 DIAGNOSIS — J40 Bronchitis, not specified as acute or chronic: Secondary | ICD-10-CM | POA: Diagnosis not present

## 2012-09-27 LAB — CBC WITH DIFFERENTIAL/PLATELET
Basophils Absolute: 0.1 10*3/uL (ref 0.0–0.1)
Basophils Relative: 1 % (ref 0–1)
Eosinophils Absolute: 0.4 10*3/uL (ref 0.0–0.7)
HCT: 21.5 % — ABNORMAL LOW (ref 39.0–52.0)
MCHC: 34.9 g/dL (ref 30.0–36.0)
Monocytes Absolute: 2.8 10*3/uL — ABNORMAL HIGH (ref 0.1–1.0)
Neutro Abs: 9.6 10*3/uL — ABNORMAL HIGH (ref 1.7–7.7)
Neutrophils Relative %: 57 % (ref 43–77)
RDW: 19.1 % — ABNORMAL HIGH (ref 11.5–15.5)

## 2012-09-27 LAB — BASIC METABOLIC PANEL
BUN: 9 mg/dL (ref 6–23)
CO2: 24 mEq/L (ref 19–32)
Calcium: 8.8 mg/dL (ref 8.4–10.5)
Creatinine, Ser: 0.6 mg/dL (ref 0.50–1.35)

## 2012-09-27 LAB — RETICULOCYTES: Retic Ct Pct: 10.8 % — ABNORMAL HIGH (ref 0.4–3.1)

## 2012-09-27 MED ORDER — SODIUM CHLORIDE 0.9 % IV BOLUS (SEPSIS)
1000.0000 mL | Freq: Once | INTRAVENOUS | Status: AC
  Administered 2012-09-27: 1000 mL via INTRAVENOUS

## 2012-09-27 MED ORDER — HYDROMORPHONE HCL PF 2 MG/ML IJ SOLN
2.0000 mg | INTRAMUSCULAR | Status: DC | PRN
  Administered 2012-09-27 (×3): 2 mg via INTRAVENOUS
  Filled 2012-09-27 (×3): qty 1

## 2012-09-27 MED ORDER — HYDROMORPHONE HCL PF 2 MG/ML IJ SOLN: 2.0000 mg | Freq: Once | INTRAMUSCULAR | Status: DC

## 2012-09-27 MED ORDER — KETOROLAC TROMETHAMINE 30 MG/ML IJ SOLN
30.0000 mg | Freq: Once | INTRAMUSCULAR | Status: AC
  Administered 2012-09-27: 30 mg via INTRAVENOUS
  Filled 2012-09-27: qty 1

## 2012-09-27 MED ORDER — MORPHINE SULFATE ER 15 MG PO TBCR: 15.0000 mg | EXTENDED_RELEASE_TABLET | Freq: Two times a day (BID) | ORAL | Status: DC

## 2012-09-27 MED ORDER — HYDROMORPHONE HCL PF 1 MG/ML IJ SOLN
1.0000 mg | Freq: Once | INTRAMUSCULAR | Status: AC
  Administered 2012-09-27: 1 mg via INTRAVENOUS
  Filled 2012-09-27: qty 1

## 2012-09-27 MED ORDER — HEPARIN SOD (PORK) LOCK FLUSH 100 UNIT/ML IV SOLN
500.0000 [IU] | Freq: Once | INTRAVENOUS | Status: AC
  Administered 2012-09-27: 500 [IU] via INTRAVENOUS
  Filled 2012-09-27: qty 5

## 2012-09-27 MED ORDER — HYDROMORPHONE HCL 4 MG PO TABS: 4.0000 mg | ORAL_TABLET | ORAL | Status: DC | PRN

## 2012-09-27 MED ORDER — DIPHENHYDRAMINE HCL 50 MG/ML IJ SOLN
25.0000 mg | Freq: Once | INTRAMUSCULAR | Status: AC
  Administered 2012-09-27: 25 mg via INTRAVENOUS
  Filled 2012-09-27: qty 1

## 2012-09-27 NOTE — Progress Notes (Signed)
Patient: [name] DOB :10-17-1980 MRN :[id number]  Date: 09/27/2012  Documentation Initiated by : Jefm Miles  Subjective/Objective Assessment: [name]  Is a 32 year old male with known SCD, Hb SS. He is in the office today regarding medication management and possible assitance. [name] had a doctor's appointment with [name], NP for hospital follow-up and medication refill. During his visit he spoke to [place], NP regarding his Celebrex and Ambien prescription refills, which he received yesterday. [name] went to the ED after his appointment and stated he did not have any medications. [name] stated during his appointment he was advised by Randa Evens NP that he can pick up his narcotic prescription on Friday 09/28/12 due to our office being closed on Monday 10/01/12. [name] stated "I had 2 more pills when I left here yesterday and I thought I would be fine." This CM encouraged [name] to let the providers know if he is running low on medication and to also call if he felt worst than normal so that provider can instruct him on plan of action (ie: take more of prescription meds, come to day hosp, etc). [name] verbalized understanding. [name], NP present in the room. Randa Evens, NP will review and provide [name] with prescriptions as needed (Dialudid & MS Contin).      Barriers to Care: Medication concerns Prior Approval (PA) #:na PA start date: na PA end date:  na  Action/Plan: This CM will continue to monitor and encourage [name] to verbalize his needs during provider interaction.  Mr. Dibello received 2 prescriptions Ms Contin 15 mg#60 and Dilaudid 4mg  #90.  Comments: [name] was very apologitic and felt he could manage his pain until Friday.  Time spent 30 minutes  Karoline Caldwell, RN, BSN, [place]     [phone]

## 2012-09-27 NOTE — H&P (Signed)
Pt seen and examined. No CVA tenderness noted on examination. Discussed with NP [name]. Agree with assessment and plan.

## 2012-09-27 NOTE — H&P (Signed)
Pt seen and examined and discussed with NP Maitland Lesiak [name]. Agree with assessment and plan. 

## 2012-09-27 NOTE — ED Provider Notes (Addendum)
CSN: [id number]     Arrival date & time 09/26/12  2323 History   First MD Initiated Contact with Patient 09/26/12 2356     Chief Complaint  Patient presents with  . Sickle Cell Pain Crisis   (Consider location/radiation/quality/duration/timing/severity/associated sxs/prior Treatment) HPI Patient has a history of sickle cell disease.  He is reporting sickle cell crisis pain in his back.  He is normally on 4 mg Dilaudid tabs at home but states he currently does not have anemia home and cannot get his refills for 2 days.  Denies fevers and chills.  No cough or shortness of breath.  No active chest pain.  He reports his pain is typical for sickle cell crisis pain.  He states normally he can control this type of pain home with pain medication but is currently out.  No other complaints. Past Medical History  Diagnosis Date  . Sickle cell anemia   . Blood transfusion   . 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 Hypokalemia   . Mood disorder   . Pulmonary embolism   . Avascular necrosis   . Leukocytosis     Chronic  . Thrombocytosis     Chronic  . Hypertension   . C. difficile colitis    Past Surgical History  Procedure Laterality Date  . Right hip replacement      08/2006  . Cholecystectomy      01/2008  . Porta cath placement    . Porta cath removal    . Umbilical hernia repair      01/2008  . Excision of left periauricular cyst      10/2009  . Excision of right ear lobe cyst with primary closur      11/2007  . Portacath placement  01/05/2012    Procedure: INSERTION PORT-A-CATH;  Surgeon: Adolph Pollack, MD;  Location: [facility] OR;  Service: General;  Laterality: N/A;  ultrasound guiced port a cath insertion with fluoroscopy   Family History  Problem Relation Age of Onset  . Sickle cell anemia Mother   . Sickle cell anemia Father   . Sickle cell trait Brother    History  Substance Use Topics  . Smoking status: Former Smoker -- 13 years    Types:  Cigarettes    Quit date: 07/08/2010  . Smokeless tobacco: Never Used  . Alcohol Use: No    Review of Systems  All other systems reviewed and are negative.    Allergies  Morphine and related  Home Medications   Current Outpatient Rx  Name  Route  Sig  Dispense  Refill  . celecoxib (CELEBREX) 200 MG capsule   Oral   Take 1 capsule (200 mg total) by mouth 2 (two) times daily as needed for pain.   30 capsule   2   . folic acid (FOLVITE) 1 MG tablet   Oral   Take 1 tablet (1 mg total) by mouth every morning.   30 tablet   11   . HYDROmorphone (DILAUDID) 4 MG tablet   Oral   Take 1 tablet (4 mg total) by mouth every 4 (four) hours as needed for pain.   90 tablet   0   . hydroxyurea (HYDREA) 500 MG capsule   Oral   Take 2 capsules (1,000 mg total) by mouth daily. May take with food to minimize GI side effects.   60 capsule   1   . morphine (MS CONTIN) 15 MG 12  hr tablet   Oral   Take 1 tablet (15 mg total) by mouth 2 (two) times daily.   60 tablet   0   . potassium chloride SA (K-DUR,KLOR-CON) 20 MEQ tablet   Oral   Take 1 tablet (20 mEq total) by mouth every morning.   30 tablet   3   . Rivaroxaban (XARELTO) 20 MG TABS   Oral   Take 20 mg by mouth every morning.          . zolpidem (AMBIEN) 10 MG tablet   Oral   Take 1 tablet (10 mg total) by mouth at bedtime as needed for sleep. For sleep   30 tablet   1    BP 114/64  Pulse 91  Temp(Src) 98.8 F (37.1 C) (Oral)  Resp 16  Ht 6' (1.829 m)  Wt 173 lb (78.472 kg)  BMI 23.46 kg/m2  SpO2 97% Physical Exam  Nursing note and vitals reviewed. Constitutional: He is oriented to person, place, and time. He appears well-developed and well-nourished.  HENT:  Head: Normocephalic and atraumatic.  Eyes: EOM are normal.  Neck: Normal range of motion.  Cardiovascular: Normal rate, regular rhythm, normal heart sounds and intact distal pulses.   Pulmonary/Chest: Effort normal and breath sounds normal. No  respiratory distress.  Abdominal: Soft. He exhibits no distension. There is no tenderness.  Genitourinary: Rectum normal.  Musculoskeletal: Normal range of motion.  Neurological: He is alert and oriented to person, place, and time.  Skin: Skin is warm and dry.  Psychiatric: He has a normal mood and affect. Judgment normal.    ED Course  Procedures (including critical care time) Labs Review Labs Reviewed  CBC WITH DIFFERENTIAL - Abnormal; Notable for the following:    WBC 16.7 (*)    RBC 2.35 (*)    Hemoglobin 7.5 (*)    HCT 21.5 (*)    RDW 19.1 (*)    Platelets 452 (*)    Neutro Abs 9.6 (*)    Monocytes Relative 16 (*)    Monocytes Absolute 2.8 (*)    All other components within normal limits  BASIC METABOLIC PANEL - Abnormal; Notable for the following:    Glucose, Bld 105 (*)    All other components within normal limits  RETICULOCYTES - Abnormal; Notable for the following:    Retic Ct Pct 10.8 (*)    RBC. 2.35 (*)    Retic Count, Manual 253.8 (*)    All other components within normal limits   Imaging Review Dg Chest 2 View  09/27/2012   *RADIOLOGY REPORT*  Clinical Data: Sickle cell crisis  CHEST - 2 VIEW  Comparison: 09/20/2012; 08/18/2012; chest CT - 08/27/2012  Findings:  Grossly unchanged borderline enlarged cardiac silhouette and mediastinal contours.  Stable positioning of support apparatus. There is grossly unchanged mild diffuse slightly nodular thickening of the pulmonary interstitium.  No focal airspace opacities.  No pleural effusion or pneumothorax.  No definite evidence of edema. Grossly unchanged bones.  Post cholecystectomy.  IMPRESSION: Mild bronchitic change without acute cardiopulmonary disease.   Original Report Authenticated By: Tacey Ruiz, MD   I personally reviewed the imaging tests through PACS system I reviewed available ER/hospitalization records through the EMR   MDM   1. Back pain   2. Sickle cell anemia with pain    Patient is overall  well-appearing.  Labs we obtained.  Pain treated.  Doubt acute chest syndrome.  3:17 AM Patient feels much better at this  time.  Discharge home in good condition.  Hemoglobin is 7.5 is consistent with his prior anemia.  Lyanne Co, MD 09/27/12 4098  Lyanne Co, MD 09/27/12 [phone]

## 2012-09-27 NOTE — H&P (Signed)
Pt examined and assessed. Discussed with NP Brailynn Breth [name]. Agree with assessment andplan. 

## 2012-09-27 NOTE — ED Notes (Signed)
Bed: WA17 Expected date:  Expected time:  Means of arrival:  Comments: Hold for Room 5

## 2012-09-27 NOTE — ED Notes (Signed)
Dr Patria Mane notified to disregard cbc and reticulytes results posted for 0031 per lab; correct results to be posted

## 2012-09-27 NOTE — Discharge Summary (Signed)
Pt was treated with clinician assisted doses of  Dilaudid and adjunctive treatment with Toradol, heat, IVF. He showed no evidence of increased hemolysis as his bilirubin was decreased from the previous. Pain was decreased at the time of discharge. Pt discussed with NP [name] after examination and assessment.

## 2012-09-28 ENCOUNTER — Telehealth (HOSPITAL_COMMUNITY): Payer: Self-pay | Admitting: Hematology

## 2012-09-28 ENCOUNTER — Non-Acute Institutional Stay (HOSPITAL_COMMUNITY)
Admission: AD | Admit: 2012-09-28 | Discharge: 2012-09-28 | Disposition: A | Discharge status: Home / Self Care | Payer: Medicare Other | Attending: Internal Medicine | Admitting: Internal Medicine

## 2012-09-28 ENCOUNTER — Encounter (HOSPITAL_COMMUNITY): Payer: Self-pay | Admitting: *Deleted

## 2012-09-28 DIAGNOSIS — R000 Tachycardia, unspecified: Secondary | ICD-10-CM | POA: Insufficient documentation

## 2012-09-28 DIAGNOSIS — Z86711 Personal history of pulmonary embolism: Secondary | ICD-10-CM | POA: Insufficient documentation

## 2012-09-28 DIAGNOSIS — G8929 Other chronic pain: Secondary | ICD-10-CM | POA: Diagnosis not present

## 2012-09-28 DIAGNOSIS — D72829 Elevated white blood cell count, unspecified: Secondary | ICD-10-CM | POA: Insufficient documentation

## 2012-09-28 DIAGNOSIS — E860 Dehydration: Secondary | ICD-10-CM | POA: Diagnosis not present

## 2012-09-28 DIAGNOSIS — E876 Hypokalemia: Secondary | ICD-10-CM | POA: Insufficient documentation

## 2012-09-28 DIAGNOSIS — Z79899 Other long term (current) drug therapy: Secondary | ICD-10-CM | POA: Diagnosis not present

## 2012-09-28 DIAGNOSIS — I10 Essential (primary) hypertension: Secondary | ICD-10-CM | POA: Insufficient documentation

## 2012-09-28 DIAGNOSIS — R52 Pain, unspecified: Secondary | ICD-10-CM | POA: Diagnosis not present

## 2012-09-28 DIAGNOSIS — D5700 Hb-SS disease with crisis, unspecified: Secondary | ICD-10-CM | POA: Diagnosis not present

## 2012-09-28 LAB — CBC WITH DIFFERENTIAL/PLATELET
Basophils Relative: 1 % (ref 0–1)
Eosinophils Relative: 2 % (ref 0–5)
HCT: 19.8 % — ABNORMAL LOW (ref 39.0–52.0)
Hemoglobin: 6.9 g/dL — CL (ref 13.0–17.0)
Lymphs Abs: 4.4 10*3/uL — ABNORMAL HIGH (ref 0.7–4.0)
MCH: 32.2 pg (ref 26.0–34.0)
MCV: 92.5 fL (ref 78.0–100.0)
Monocytes Absolute: 3.3 10*3/uL — ABNORMAL HIGH (ref 0.1–1.0)
Monocytes Relative: 14 % — ABNORMAL HIGH (ref 3–12)
Neutro Abs: 15 10*3/uL — ABNORMAL HIGH (ref 1.7–7.7)
Platelets: 489 10*3/uL — ABNORMAL HIGH (ref 150–400)
RBC: 2.14 MIL/uL — ABNORMAL LOW (ref 4.22–5.81)

## 2012-09-28 LAB — COMPREHENSIVE METABOLIC PANEL
Alkaline Phosphatase: 79 U/L (ref 39–117)
BUN: 7 mg/dL (ref 6–23)
CO2: 22 mEq/L (ref 19–32)
Chloride: 106 mEq/L (ref 96–112)
Creatinine, Ser: 0.59 mg/dL (ref 0.50–1.35)
GFR calc non Af Amer: >90 mL/min (ref >90)
Glucose, Bld: 100 mg/dL — ABNORMAL HIGH (ref 70–99)
Potassium: 3.5 mEq/L (ref 3.5–5.1)
Total Bilirubin: 8.1 mg/dL — ABNORMAL HIGH (ref 0.3–1.2)

## 2012-09-28 LAB — HEMOGLOBIN AND HEMATOCRIT, BLOOD
HCT: 21 % — ABNORMAL LOW (ref 39.0–52.0)
Hemoglobin: 7.3 g/dL — ABNORMAL LOW (ref 13.0–17.0)

## 2012-09-28 LAB — RETICULOCYTES
RBC.: 2.14 MIL/uL — ABNORMAL LOW (ref 4.22–5.81)
Retic Ct Pct: 13.4 % — ABNORMAL HIGH (ref 0.4–3.1)

## 2012-09-28 MED ORDER — SODIUM CHLORIDE 0.9 % IJ SOLN
10.0000 mL | INTRAMUSCULAR | Status: AC | PRN
  Administered 2012-09-28: 10 mL

## 2012-09-28 MED ORDER — DIPHENHYDRAMINE HCL 50 MG/ML IJ SOLN: 12.5000 mg | INTRAMUSCULAR | Status: DC | PRN

## 2012-09-28 MED ORDER — RIVAROXABAN 20 MG PO TABS
20.0000 mg | ORAL_TABLET | Freq: Every day | ORAL | Status: DC
  Filled 2012-09-28: qty 1

## 2012-09-28 MED ORDER — ONDANSETRON HCL 4 MG/2ML IJ SOLN: 4.0000 mg | INTRAMUSCULAR | Status: DC | PRN

## 2012-09-28 MED ORDER — ACETAMINOPHEN 325 MG PO TABS
650.0000 mg | ORAL_TABLET | Freq: Once | ORAL | Status: AC
  Administered 2012-09-28: 650 mg via ORAL
  Filled 2012-09-28: qty 2

## 2012-09-28 MED ORDER — SENNOSIDES-DOCUSATE SODIUM 8.6-50 MG PO TABS
1.0000 | ORAL_TABLET | Freq: Two times a day (BID) | ORAL | Status: DC
  Administered 2012-09-28: 1 via ORAL
  Filled 2012-09-28: qty 1

## 2012-09-28 MED ORDER — HYDROMORPHONE HCL PF 2 MG/ML IJ SOLN
4.0000 mg | Freq: Once | INTRAMUSCULAR | Status: AC
  Administered 2012-09-28: 4 mg via INTRAVENOUS
  Filled 2012-09-28: qty 2

## 2012-09-28 MED ORDER — HYDROMORPHONE HCL PF 2 MG/ML IJ SOLN
4.0000 mg | INTRAMUSCULAR | Status: DC
  Administered 2012-09-28 (×4): 4 mg via INTRAVENOUS
  Filled 2012-09-28 (×4): qty 2

## 2012-09-28 MED ORDER — DIPHENHYDRAMINE HCL 25 MG PO CAPS
25.0000 mg | ORAL_CAPSULE | ORAL | Status: DC | PRN
  Administered 2012-09-28: 25 mg via ORAL
  Filled 2012-09-28: qty 1

## 2012-09-28 MED ORDER — HEPARIN SOD (PORK) LOCK FLUSH 100 UNIT/ML IV SOLN
500.0000 [IU] | INTRAVENOUS | Status: AC | PRN
  Administered 2012-09-28: 500 [IU]
  Filled 2012-09-28: qty 5

## 2012-09-28 MED ORDER — ONDANSETRON HCL 4 MG PO TABS: 4.0000 mg | ORAL_TABLET | ORAL | Status: DC | PRN

## 2012-09-28 MED ORDER — HYDROMORPHONE HCL PF 2 MG/ML IJ SOLN
1.0000 mg | Freq: Once | INTRAMUSCULAR | Status: AC
  Administered 2012-09-28: 1 mg via INTRAVENOUS
  Filled 2012-09-28: qty 1

## 2012-09-28 MED ORDER — HYDROMORPHONE HCL PF 2 MG/ML IJ SOLN
2.0000 mg | Freq: Once | INTRAMUSCULAR | Status: AC
  Administered 2012-09-28: 2 mg via INTRAVENOUS
  Filled 2012-09-28: qty 1

## 2012-09-28 MED ORDER — FOLIC ACID 1 MG PO TABS: 1.0000 mg | ORAL_TABLET | Freq: Every day | ORAL | Status: DC

## 2012-09-28 MED ORDER — KETOROLAC TROMETHAMINE 30 MG/ML IJ SOLN
30.0000 mg | Freq: Once | INTRAMUSCULAR | Status: AC
  Administered 2012-09-28: 30 mg via INTRAVENOUS
  Filled 2012-09-28: qty 1

## 2012-09-28 MED ORDER — CELECOXIB 200 MG PO CAPS: 200.0000 mg | ORAL_CAPSULE | Freq: Two times a day (BID) | ORAL | Status: DC | PRN

## 2012-09-28 MED ORDER — DEXTROSE-NACL 5-0.45 % IV SOLN
INTRAVENOUS | Status: DC
  Administered 2012-09-28: 09:00:00 via INTRAVENOUS

## 2012-09-28 MED ORDER — MORPHINE SULFATE ER 15 MG PO TBCR: 15.0000 mg | EXTENDED_RELEASE_TABLET | Freq: Two times a day (BID) | ORAL | Status: DC

## 2012-09-28 NOTE — Telephone Encounter (Signed)
Patient C/O pain to back and ribs that is 10/10.  Patient states he took dilaudid and MS contin at 6:00a.m.  Denies fever, chest pain, nausea or vomiting, abdominal pain or diarrhea.  Advised I would notify Dr. Ashley Royalty and give him a call back.  Patient verbalizes understanding.

## 2012-09-28 NOTE — H&P (Signed)
[facility] History and Physical  [name] [name] DOB: 03/12/1980 DOA: 09/28/2012   PCP: Dominic Rhome A., MD   Chief Complaint: Pain in back x 1 day  HPI:Pt  With Hb SS who has presented for acute care 3 times in the last week. Pt states that his pain is 10/10 and localized to his low back. He states that he awoke in the middle of the night with pain. The pain is throbbing in nature and not associated with any other symptoms. He took his oral medications and increased his oral fluid intake but this has not been effective.   Pt states that he ended up in the ED the night after his office visit as he was having pain which he thought he could manage at home with his oral medications and fluids. However the pain got worse at night and he had to access acute care at night thus ending up ion the ED.    Review of Systems:  Constitutional: No weight loss, night sweats, Fevers, chills, fatigue.  HEENT: No headaches, dizziness, seizures, vision changes, difficulty swallowing,Tooth/dental problems,Sore throat, No sneezing, itching, ear ache, nasal congestion, post nasal drip,  Cardio-vascular: No chest pain, Orthopnea, PND, swelling in lower extremities, anasarca, dizziness, palpitations  GI: No heartburn, indigestion, abdominal pain, nausea, vomiting, diarrhea, change in bowel habits, loss of appetite  Resp: No shortness of breath with exertion or at rest. No excess mucus, no productive cough, No non-productive cough, No coughing up of blood.No change in color of mucus.No wheezing.No chest wall deformity  Skin: no rash or lesions.  GU: no dysuria, change in color of urine, no urgency or frequency. No flank pain.  Psych: No change in mood or affect. No depression or anxiety. No memory loss.    Past Medical History  Diagnosis Date  . Sickle cell anemia   . Blood transfusion   . 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 Hypokalemia   . Mood  disorder   . Pulmonary embolism   . Avascular necrosis   . Leukocytosis     Chronic  . Thrombocytosis     Chronic  . Hypertension   . C. difficile colitis    Past Surgical History  Procedure Laterality Date  . Right hip replacement      08/2006  . Cholecystectomy      01/2008  . Porta cath placement    . Porta cath removal    . Umbilical hernia repair      01/2008  . Excision of left periauricular cyst      10/2009  . Excision of right ear lobe cyst with primary closur      11/2007  . Portacath placement  01/05/2012    Procedure: INSERTION PORT-A-CATH;  Surgeon: Adolph Pollack, MD;  Location: [facility] OR;  Service: General;  Laterality: N/A;  ultrasound guiced port a cath insertion with fluoroscopy   Social History:  reports that he quit smoking about 2 years ago. His smoking use included Cigarettes. He smoked 0.00 packs per day for 13 years. He has never used smokeless tobacco. He reports that he does not drink alcohol or use illicit drugs.  Allergies  Allergen Reactions  . Morphine And Related Hives and Rash    Pt states he can take the pill form, but not IV and he is able to tolerate Dilaudid with no reactions.    Family History  Problem Relation Age of Onset  . Sickle  cell anemia Mother   . Sickle cell anemia Father   . Sickle cell trait Brother     Prior to Admission medications   Medication Sig Start Date End Date Taking? Authorizing Provider  celecoxib (CELEBREX) 200 MG capsule Take 1 capsule (200 mg total) by mouth 2 (two) times daily as needed for pain. 09/26/12  Yes Grayce Sessions, NP  folic acid (FOLVITE) 1 MG tablet Take 1 tablet (1 mg total) by mouth every morning. 06/05/12  Yes Altha Harm, MD  HYDROmorphone (DILAUDID) 4 MG tablet Take 1 tablet (4 mg total) by mouth every 4 (four) hours as needed for pain. 09/27/12  Yes Grayce Sessions, NP  hydroxyurea (HYDREA) 500 MG capsule Take 2 capsules (1,000 mg total) by mouth daily. May take with food to  minimize GI side effects. 07/12/12  Yes Altha Harm, MD  morphine (MS CONTIN) 15 MG 12 hr tablet Take 1 tablet (15 mg total) by mouth 2 (two) times daily. 09/27/12  Yes Grayce Sessions, NP  potassium chloride SA (K-DUR,KLOR-CON) 20 MEQ tablet Take 1 tablet (20 mEq total) by mouth every morning. 07/31/12  Yes Altha Harm, MD  Rivaroxaban (XARELTO) 20 MG TABS Take 20 mg by mouth every morning.    Yes Historical Provider, MD  zolpidem (AMBIEN) 10 MG tablet Take 1 tablet (10 mg total) by mouth at bedtime as needed for sleep. For sleep 09/26/12  Yes Grayce Sessions, NP   Physical Exam: Filed Vitals:   09/28/12 0852  BP: 141/64  Pulse: 92  Temp: 99.1 F (37.3 C)  TempSrc: Oral  Resp: 20  SpO2: 94%   BP 141/64  Pulse 92  Temp(Src) 99.1 F (37.3 C) (Oral)  Resp 20  SpO2 94%  General Appearance:    Alert, cooperative, no distress, appears stated age  Head:    Normocephalic, without obvious abnormality, atraumatic  Eyes:    PERRL, conjunctiva/corneas clear, EOM's intact, fundi    benign, both eyes with moderate icterus   Throat:   Lips, mucosa, and tongue tachy to dry. Normal. Teeth and gums normal  Neck:   Supple, symmetrical, trachea midline, no adenopathy;       thyroid:  No enlargement/tenderness/nodules; no carotid   bruit or JVD  Back:     Symmetric, no curvature, ROM normal, no CVA tenderness  Lungs:     Clear to auscultation bilaterally, respirations unlabored  Chest wall:    No tenderness or deformity  Heart:    Regular rate and rhythm, S1 and S2 normal, no murmur, rub   or gallop  Abdomen:     Soft, non-tender, bowel sounds active all four quadrants,    no masses, no organomegaly  Extremities:   Extremities normal, atraumatic, no cyanosis or edema  Pulses:   2+ and symmetric all extremities  Skin:   Skin color, texture, turgor normal, no rashes or lesions  Lymph nodes:   Cervical, supraclavicular, and axillary nodes normal  Neurologic:   CNII-XII intact.  Normal strength, sensation and reflexes      throughout    Labs on Admission:   Basic Metabolic Panel:  Recent Labs Lab 09/22/12 0557 09/23/12 0400 09/27/12 0024  NA 134* 137 137  K 3.9 4.1 3.7  CL 104 105 104  CO2 25 24 24   GLUCOSE 98 90 105*  BUN 6 9 9   CREATININE 0.56 0.54 0.60  CALCIUM 8.6 9.0 8.8   Liver Function Tests: No results found for this  basename: AST, ALT, ALKPHOS, BILITOT, PROT, ALBUMIN,  in the last 168 hours CBC:  Recent Labs Lab 09/22/12 0557 09/23/12 0400 09/27/12 0024  WBC 17.5* 17.4* 16.7*  NEUTROABS  --   --  9.6*  HGB 7.1* 7.2* 7.5*  HCT 20.8* 21.4* 21.5*  MCV 93.3 93.4 91.5  PLT 487* 458* 452*     Assessment/Plan: Active Problems: 1. Acute VOC associated with Hb SS. Pt with pain and clinical evidence of hemolysis which is likely contributing to his pain. Patient is opiate tolerant thus will start with aggressive clinician assisted doses of Dilaudid 4 mg x 1 then re-assess in 30 minutes and then in 15 minutes. Pt will also receive an initiial dose of Toradol as well as aggressive IV hydration.   2. Hemolysis: Pt's clinical presentation is consistent with hemolysis will check LDH and Bilirubin as well as CBC wit diff. If Hb < 7 will likely need transfusion as patient also minimally dehydrated  3.  Dehydration: Pt clinically with mild dehydration. Will hydrate aggessivly.  4. Mild Tachycardia: Pt usually with baseline HR in 70's. I suspect that this is secondary to pain and dehydration. Will are-assess after hydration and pain management.   Time spend: 40 minutes Code Status: Full Code Family Communication: N/A Disposition Plan: Plan for home at discharge  Tahsin Benyo A., MD  Pager [phone]  If 7PM-7AM, please contact night-coverage [email or web address] Password [facility] 09/28/2012, 9:29 AM

## 2012-09-28 NOTE — Progress Notes (Signed)
Discharged patient home. VSS. Patient in no apparent distress. Discharge instructions reviewed. [facility] amb[facility] via self.

## 2012-09-28 NOTE — Progress Notes (Signed)
CRITICAL VALUE ALERT  Critical value received:  Hgb 6.9  Date of notification:  09/28/2012   Time of notification:  9:30  Critical value read back:yes     Dr. Ashley Royalty notified of result verbally.

## 2012-09-28 NOTE — Telephone Encounter (Signed)
Called patient back and advised that per Dr.[name], he can come to the [facility].  Patient verbalizes understanding.

## 2012-10-01 NOTE — Discharge Summary (Signed)
[facility] Discharge Summary   Patient ID: [name] MRN: [id number] DOB/AGE: 33-Feb-1982 33 y.o.  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     folic acid 1 MG tablet  Commonly known as:  FOLVITE  Take 1 tablet (1 mg total) by mouth every morning.     HYDROmorphone 4 MG tablet  Commonly known as:  DILAUDID  Take 1 tablet (4 mg total) by mouth every 4 (four) hours as needed for pain.     hydroxyurea 500 MG capsule  Commonly known as:  HYDREA  Take 2 capsules (1,000 mg total) by mouth daily. May take with food to minimize GI side effects.     morphine 15 MG 12 hr tablet  Commonly known as:  MS CONTIN  Take 1 tablet (15 mg total) by mouth 2 (two) times daily.     potassium chloride SA 20 MEQ tablet  Commonly known as:  K-DUR,KLOR-CON  Take 1 tablet (20 mEq total) by mouth every morning.     XARELTO 20 MG Tabs tablet  Generic drug:  Rivaroxaban  Take 20 mg by mouth every morning.     zolpidem 10 MG tablet  Commonly known as:  AMBIEN  Take 1 tablet (10 mg total) by mouth at bedtime as needed for sleep. For sleep         Consults:  None  Significant Diagnostic Studies:  Dg Chest 2 View  09/17/2012   *RADIOLOGY REPORT*  Clinical Data: Shortness of breath  CHEST - 2 VIEW  Comparison: 09/10/2012  Findings: There is a left chest wall porta-catheter with tip in the cavoatrial  junction.  Heart size appears mildly enlarged.  No pleural effusion or edema identified.  No airspace consolidation noted.  Mild spondylosis identified within the thoracic spine.  IMPRESSION:  1.  No acute cardiopulmonary abnormalities.   Original Report Authenticated By: [name], M.D.   Dg Chest 2 View  09/10/2012   *RADIOLOGY REPORT*  Clinical Data: Sickle cell pain crisis.  CHEST - 2 VIEW  Comparison: 08/18/2012.  Findings: Unchanged appearance of left subclavian portacatheter, tip at the superior cavoatrial junction.  Stable osseous findings, including bilateral proximal humeral osteonecrosis.  Chronic cardiomegaly.  Unremarkable upper mediastinal contours.  Coarsened interstitial markings without edema or infiltrate.  No effusion or pneumothorax.  IMPRESSION:  Chronic interstitial changes and cardiomegaly.  No evidence of acute cardiopulmonary disease.   Original Report Authenticated By: [name]   Ct Angio Chest Pe W/cm &/or Wo Cm  08/27/2012   **ADDENDUM** CREATED: 08/27/2012 17:11:37  Comment:  I communicated this report directly by phone to Dr. Ashley [name] at the [facility], August 27, 2012, 05:11 p.m.  **END ADDENDUM** SIGNED BY: [name], M.D.  08/27/2012   *RADIOLOGY REPORT*  Clinical Data: Shortness of breath  CT ANGIOGRAPHY CHEST  Technique:  Multidetector CT imaging  of the chest using the standard protocol during bolus administration of intravenous contrast. Multiplanar reconstructed images including MIPs were obtained and reviewed to evaluate the vascular anatomy.  Contrast: OMNIPAQUE IOHEXOL 350 MG/ML SOLN  Comparison:  Chest radiograph August 18, 2012 and chest CT angiogram March 01, 2012  Findings: There is a pulmonary embolus in a peripheral branch of the superior   right lower lobe superior segment pulmonary artery. There is no thoracic aortic aneurysm or dissection.  There is patchy infiltrate in the anterior and lateral segments of the left lower lobe.   There is also patchy infiltrate in the lateral segment right lower lobe.  There is patchy bibasilar atelectasis.  There is also mild atelectatic change in the posterior segment right upper lobe.  There is mild scarring in the anterior segment left upper lobe medially.  There is a small area of infiltrate in the anterior segment left upper lobe on slice 27.  There are multiple lymph nodes throughout the mediastinum, largest adjacent to the aortic arch.  The largest of these lymph nodes measures 1 x 0.9 cm.  By size criteria, there is no frank adenopathy.  Port-A-Cath tip is at the cavoatrial junction.  Heart is enlarged.  Pericardium is not thickened.  Visualized upper abdominal structures appear normal except for diffuse calcification in the spleen.  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  IMPRESSION: Small pulmonary embolus in a branch of the superior segment right lower lobe pulmonary artery.  No other pulmonary emboli identified.  Bony changes of sickle cell disease.  Splenic changes of sickle cell disease.  Areas of patchy infiltrate bilaterally.  Stable small mediastinal lymph nodes which are stable.   Original Report Authenticated By: [name], M.D.     [facility] Course:  For complete details please refer to admission H and P, but in brief, [name] is a 32 year old African American Male with SS genotype SCD. He has a PMH of pulmonary embolism , and hypokalemia. He was treated for acute care 3 times in the last week. He was admitted to the [facility] the management of his symptoms . Treatment included:  Vaso occlusive crisis with active hemolysis  Aggressive treatment with hydration with D51/2 NS at 125cc, Dilaudid 4 mg x 1 then re-assess in 30 minutes and then in 15 minutes, scheduled 4mg  of hydromorphone Q 2hrs for 3 doses than prn- Q 2 hrs. Antipruitics and antiemetics prn for associated side effects from  analgesics. Mainteanace medication of Folic acid 1mg  and hydroxyurea 1000 mg QD continue at d/c  Anemia: HGB 6.9 with active hemolysis and IVF suspect further decline he received 1 united of PRBC and tolerated well Acute on chronic pain: continue long acting analgesic. Direction change in his hydromorphone to further manage pain crisis he has been instructed to take 2(4mg ) of hydromorphone Q 4hrs  for the next 2 days than resume to 4mg  Q 4hrs prn. Clinical dehydration. repleted with IVF. Mild Tachycardia: HR in 80 . Resolved  Hx of hypokalemia: stable continued home dose of K dur 20 meq QD  Hx of PE: continue home dose of Xareto   Physical Exam at Discharge:  BP 123/78  Pulse 80  Temp(Src) 99.1 F (37.3 C) (Oral)  Resp 18  SpO2 97%  Gen: Alert, cooperative, no distress Cardiovascular: Regular rate and rhythm, S1 and S2 normal, no murmur, rub or gallop  Respiratory: Clear to auscultation bilaterally,  respirations unlabored, no vocal fremitus  Gastrointestinal: Soft, non-tender, bowel sounds active all four quadrants,  Extremities:Extremities normal, atraumatic, no cyanosis or edema  Pulses: 2+ and symmetric all extremities   Disposition at Discharge: 01-Home or Self Care  Discharge Orders:  Future Appointments Provider Department Dept Phone   11/26/2012 1:00 PM Grayce Sessions, NP [place] [facility] [phone]      Condition at Discharge:   Stable  Time spent on Discharge:  Greater than 30 minutes.  SignedRanda Evens, Johni Narine P 10/01/2012, 5:40 PM

## 2012-10-02 LAB — TYPE AND SCREEN
ABO/RH(D): O POS
Antibody Screen: NEGATIVE
Unit division: 0

## 2012-10-08 NOTE — Discharge Summary (Signed)
Pt seen and examined and discussed with NP [name]. Agree with plan to discharge home.

## 2012-10-08 NOTE — Discharge Summary (Signed)
Pt seen and examined and discussed with NP [name]. Agree with assessment and plan. And discharge home.

## 2012-10-15 ENCOUNTER — Telehealth (HOSPITAL_COMMUNITY): Payer: Self-pay | Admitting: Hematology

## 2012-10-15 ENCOUNTER — Encounter (HOSPITAL_COMMUNITY): Payer: Self-pay | Admitting: Hematology

## 2012-10-15 ENCOUNTER — Non-Acute Institutional Stay (HOSPITAL_COMMUNITY)
Admission: AD | Admit: 2012-10-15 | Discharge: 2012-10-15 | Disposition: A | Discharge status: Home / Self Care | Payer: Medicare Other | Attending: Internal Medicine | Admitting: Internal Medicine

## 2012-10-15 DIAGNOSIS — D5700 Hb-SS disease with crisis, unspecified: Secondary | ICD-10-CM | POA: Insufficient documentation

## 2012-10-15 DIAGNOSIS — Z86711 Personal history of pulmonary embolism: Secondary | ICD-10-CM | POA: Insufficient documentation

## 2012-10-15 DIAGNOSIS — R52 Pain, unspecified: Secondary | ICD-10-CM | POA: Insufficient documentation

## 2012-10-15 DIAGNOSIS — M545 Low back pain, unspecified: Secondary | ICD-10-CM | POA: Insufficient documentation

## 2012-10-15 DIAGNOSIS — Z79899 Other long term (current) drug therapy: Secondary | ICD-10-CM | POA: Diagnosis not present

## 2012-10-15 DIAGNOSIS — Z86718 Personal history of other venous thrombosis and embolism: Secondary | ICD-10-CM | POA: Insufficient documentation

## 2012-10-15 DIAGNOSIS — D72829 Elevated white blood cell count, unspecified: Secondary | ICD-10-CM | POA: Insufficient documentation

## 2012-10-15 DIAGNOSIS — F39 Unspecified mood [affective] disorder: Secondary | ICD-10-CM | POA: Diagnosis not present

## 2012-10-15 DIAGNOSIS — E876 Hypokalemia: Secondary | ICD-10-CM | POA: Diagnosis not present

## 2012-10-15 DIAGNOSIS — E860 Dehydration: Secondary | ICD-10-CM | POA: Insufficient documentation

## 2012-10-15 DIAGNOSIS — R002 Palpitations: Secondary | ICD-10-CM | POA: Insufficient documentation

## 2012-10-15 DIAGNOSIS — Z87891 Personal history of nicotine dependence: Secondary | ICD-10-CM | POA: Insufficient documentation

## 2012-10-15 DIAGNOSIS — G8929 Other chronic pain: Secondary | ICD-10-CM | POA: Diagnosis not present

## 2012-10-15 DIAGNOSIS — Z96649 Presence of unspecified artificial hip joint: Secondary | ICD-10-CM | POA: Diagnosis not present

## 2012-10-15 LAB — CBC WITH DIFFERENTIAL/PLATELET
Basophils Relative: 0 % (ref 0–1)
Eosinophils Relative: 2 % (ref 0–5)
Hemoglobin: 7.9 g/dL — ABNORMAL LOW (ref 13.0–17.0)
Lymphocytes Relative: 16 % (ref 12–46)
MCH: 31.9 pg (ref 26.0–34.0)
Monocytes Relative: 15 % — ABNORMAL HIGH (ref 3–12)
Neutrophils Relative %: 67 % (ref 43–77)
RBC: 2.48 MIL/uL — ABNORMAL LOW (ref 4.22–5.81)

## 2012-10-15 LAB — RETICULOCYTES: RBC.: 2.48 MIL/uL — ABNORMAL LOW (ref 4.22–5.81)

## 2012-10-15 LAB — COMPREHENSIVE METABOLIC PANEL
ALT: 24 U/L (ref 0–53)
AST: 32 U/L (ref 0–37)
Albumin: 4 g/dL (ref 3.5–5.2)
Alkaline Phosphatase: 86 U/L (ref 39–117)
BUN: 6 mg/dL (ref 6–23)
Chloride: 104 mEq/L (ref 96–112)
Potassium: 3.3 mEq/L — ABNORMAL LOW (ref 3.5–5.1)
Sodium: 139 mEq/L (ref 135–145)
Total Bilirubin: 9.4 mg/dL — ABNORMAL HIGH (ref 0.3–1.2)
Total Protein: 7.2 g/dL (ref 6.0–8.3)

## 2012-10-15 MED ORDER — HYDROMORPHONE HCL PF 2 MG/ML IJ SOLN
3.0000 mg | Freq: Once | INTRAMUSCULAR | Status: AC
  Administered 2012-10-15: 3 mg via INTRAVENOUS
  Filled 2012-10-15: qty 2

## 2012-10-15 MED ORDER — SODIUM CHLORIDE 0.9 % IJ SOLN
10.0000 mL | INTRAMUSCULAR | Status: AC | PRN
  Administered 2012-10-15: 10 mL

## 2012-10-15 MED ORDER — ZOLPIDEM TARTRATE 5 MG PO TABS: 10.0000 mg | ORAL_TABLET | Freq: Every evening | ORAL | Status: DC | PRN

## 2012-10-15 MED ORDER — CELECOXIB 200 MG PO CAPS: 200.0000 mg | ORAL_CAPSULE | Freq: Two times a day (BID) | ORAL | Status: DC | PRN

## 2012-10-15 MED ORDER — IBUPROFEN 800 MG PO TABS: 800.0000 mg | ORAL_TABLET | Freq: Three times a day (TID) | ORAL | Status: DC | PRN

## 2012-10-15 MED ORDER — HYDROMORPHONE HCL PF 2 MG/ML IJ SOLN
3.0000 mg | INTRAMUSCULAR | Status: AC
  Administered 2012-10-15 (×4): 3 mg via INTRAVENOUS
  Filled 2012-10-15 (×4): qty 2

## 2012-10-15 MED ORDER — POTASSIUM CHLORIDE CRYS ER 20 MEQ PO TBCR: 20.0000 meq | EXTENDED_RELEASE_TABLET | Freq: Every morning | ORAL | Status: DC

## 2012-10-15 MED ORDER — DIPHENHYDRAMINE HCL 25 MG PO CAPS: 25.0000 mg | ORAL_CAPSULE | ORAL | Status: DC | PRN

## 2012-10-15 MED ORDER — DEXTROSE-NACL 5-0.45 % IV SOLN
INTRAVENOUS | Status: DC
  Administered 2012-10-15 (×2): via INTRAVENOUS

## 2012-10-15 MED ORDER — FOLIC ACID 1 MG PO TABS: 1.0000 mg | ORAL_TABLET | Freq: Every morning | ORAL | Status: DC

## 2012-10-15 MED ORDER — HEPARIN SOD (PORK) LOCK FLUSH 100 UNIT/ML IV SOLN
500.0000 [IU] | INTRAVENOUS | Status: AC | PRN
  Administered 2012-10-15: 500 [IU]
  Filled 2012-10-15: qty 5

## 2012-10-15 MED ORDER — POTASSIUM CHLORIDE CRYS ER 20 MEQ PO TBCR
20.0000 meq | EXTENDED_RELEASE_TABLET | Freq: Once | ORAL | Status: AC
  Administered 2012-10-15: 20 meq via ORAL
  Filled 2012-10-15: qty 1

## 2012-10-15 MED ORDER — SODIUM CHLORIDE 0.9 % IV SOLN
25.0000 mg | INTRAVENOUS | Status: DC | PRN
  Filled 2012-10-15: qty 0.5

## 2012-10-15 MED ORDER — MORPHINE SULFATE ER 15 MG PO TBCR: 15.0000 mg | EXTENDED_RELEASE_TABLET | Freq: Two times a day (BID) | ORAL | Status: DC

## 2012-10-15 MED ORDER — RIVAROXABAN 20 MG PO TABS
20.0000 mg | ORAL_TABLET | Freq: Every day | ORAL | Status: DC
  Filled 2012-10-15: qty 1

## 2012-10-15 MED ORDER — SENNOSIDES-DOCUSATE SODIUM 8.6-50 MG PO TABS: 1.0000 | ORAL_TABLET | Freq: Two times a day (BID) | ORAL | Status: DC

## 2012-10-15 NOTE — Telephone Encounter (Signed)
Spoke with Dr. Ashley Royalty.  Called patient back and advised that per Dr. Ashley Royalty, it is ok for him to come to the [facility].  Patient verablizes understanding.

## 2012-10-15 NOTE — Telephone Encounter (Signed)
Patient C/O of pain to back, sides, and legs that is 8/10.  Patient states he has taken his MS contin and dilaudid around 6:00a.m.  Denies fever, chest pain, nausea or vomiting, or abdominal pain.  Advised I would notify Dr. Ashley Royalty and give him a call back.  Patient verbalizes understanding.

## 2012-10-15 NOTE — Progress Notes (Signed)
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 He also had long car ride back from [facility]. He states that he was managing his pain well with his oral analgesics but feels that the immobility associated with his car ride and the change in temperature between [facility] and [place] increased the pain tho the point that it is now unmanageable with oral analgesics. He states that he also noted his eyes were more jaundiced than usual. He rates his pain at 7-8/10 and localizes it to ribs, back and legs. He describes his pain as throbbing. He denies any N/V/D, dizziness, SOB or any other associated symptoms. He is here for  management of simple acute pain with vaso-occlusive crisis.  Pt also states that he is thinking of moving to [place] as he is able to work in that area and likes the weather as well as feeling that he will be supported psychosocially. He states that he likes being able to work and his best options seem to be in [place].   Review of Systems:  Constitutional: No weight loss, night sweats, Fevers, chills, fatigue.  HEENT: No headaches, dizziness, seizures, vision changes, difficulty swallowing,Tooth/dental problems,Sore throat, No sneezing, itching, ear ache, nasal congestion, post nasal drip,  Cardio-vascular: No chest pain, Orthopnea, PND, swelling in lower extremities, anasarca, dizziness, palpitations  GI: No heartburn, indigestion, abdominal pain, nausea, vomiting, diarrhea, change in bowel habits, loss of appetite  Resp: No shortness of breath with exertion or at rest. No excess mucus, no productive cough, No non-productive cough, No coughing up of  blood.No change in color of mucus.No wheezing.No chest wall deformity  Skin: no rash or lesions.  GU: no dysuria, change in color of urine, no urgency or frequency. No flank pain.  Psych: No change in mood or affect. No depression or anxiety. No memory loss.    Past Medical History  Diagnosis Date  . Sickle cell anemia   . Blood transfusion   . 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 Hypokalemia   . Mood disorder   . Pulmonary embolism   . Avascular necrosis   . Leukocytosis     Chronic  . Thrombocytosis     Chronic  . Hypertension   . C. difficile colitis    Past Surgical History  Procedure Laterality Date  . Right hip replacement      08/2006  . Cholecystectomy      01/2008  . Porta cath placement    . Porta cath removal    . Umbilical hernia repair      01/2008  . Excision of left periauricular cyst      10/2009  . Excision of right ear lobe cyst with primary closur      11/2007  . Portacath placement  01/05/2012    Procedure: INSERTION PORT-A-CATH;  Surgeon: Adolph Pollack, MD;  Location: [facility];  Service: General;  Laterality: N/A;  ultrasound guiced port a cath insertion with fluoroscopy   Social History:  reports that he quit smoking about 2 years ago. His smoking use included Cigarettes. He smoked 0.00 packs per day for  13 years. He has never used smokeless tobacco. He reports that he does not drink alcohol or use illicit drugs.  Allergies  Allergen Reactions  . Morphine And Related Hives and Rash    Pt states he can take the pill form, but not IV and he is able to tolerate Dilaudid with no reactions.    Family History  Problem Relation Age of Onset  . Sickle cell anemia Mother   . Sickle cell anemia Father   . Sickle cell trait Brother     Prior to Admission medications   Medication Sig Start Date End Date Taking? Authorizing Provider  celecoxib (CELEBREX) 200 MG capsule Take 1 capsule (200 mg total) by mouth 2 (two) times daily  as needed for pain. 09/26/12  Yes Grayce Sessions, NP  folic acid (FOLVITE) 1 MG tablet Take 1 tablet (1 mg total) by mouth every morning. 06/05/12  Yes Altha Harm, MD  HYDROmorphone (DILAUDID) 4 MG tablet Take 1 tablet (4 mg total) by mouth every 4 (four) hours as needed for pain. 09/27/12  Yes Grayce Sessions, NP  hydroxyurea (HYDREA) 500 MG capsule Take 2 capsules (1,000 mg total) by mouth daily. May take with food to minimize GI side effects. 07/12/12  Yes Altha Harm, MD  morphine (MS CONTIN) 15 MG 12 hr tablet Take 1 tablet (15 mg total) by mouth 2 (two) times daily. 09/27/12  Yes Grayce Sessions, NP  potassium chloride SA (K-DUR,KLOR-CON) 20 MEQ tablet Take 1 tablet (20 mEq total) by mouth every morning. 07/31/12  Yes Altha Harm, MD  Rivaroxaban (XARELTO) 20 MG TABS Take 20 mg by mouth every morning.    Yes Historical Provider, MD  zolpidem (AMBIEN) 10 MG tablet Take 1 tablet (10 mg total) by mouth at bedtime as needed for sleep. For sleep 09/26/12  Yes Grayce Sessions, NP   Physical Exam: Filed Vitals:   10/15/12 0943  BP: 151/84  Pulse: 99  Temp: 98.2 F (36.8 C)  TempSrc: Oral  Resp: 20  SpO2: 100%   BP 151/84  Pulse 99  Temp(Src) 98.2 F (36.8 C) (Oral)  Resp 20  SpO2 100%  General Appearance:    Alert, cooperative, no distress, appears stated age  Head:    Normocephalic, without obvious abnormality, atraumatic  Eyes:    PERRL, conjunctiva/corneas clear, EOM's intact, fundi    benign, both eyes with mild icterus     Throat:   Lips, mucosa, and tongue normal; teeth and gums normal  Neck:   Supple, symmetrical, trachea midline, no adenopathy;       thyroid:  No enlargement/tenderness/nodules; no carotid   bruit or JVD  Back:     Symmetric, no curvature, ROM normal, no CVA tenderness  Lungs:     Clear to auscultation bilaterally, respirations unlabored  Chest wall:    No tenderness or deformity  Heart:    Regular rate and rhythm, S1 and S2  normal, no murmur, rub   or gallop  Abdomen:     Soft, non-tender, bowel sounds active all four quadrants,    no masses, no organomegaly  Extremities:   Extremities normal, atraumatic, no cyanosis or edema  Pulses:   2+ and symmetric all extremities  Skin:   Skin color, texture, turgor normal, no rashes or lesions  Lymph nodes:   Cervical, supraclavicular, and axillary nodes normal  Neurologic:   CNII-XII intact. Normal strength, sensation and reflexes      throughout  Labs on Admission:    Assessment/Plan: Active Problems:   Hb SS with simple acute pain: Pain not managed with oral analgesics. Pt identifies several triggers for his pain, most associated with exertion and change in temperature. He also has an increase in Jaundiced appearance from his baseline. So will check bilirubin and Hb with CMET  And CBC with diff. Will Hydrate aggressively and treat with scheduled analgesics. If renal function okay will also give a dose of Toradol.   Will also ask CM to speak with patient Re: proposed move t [place].   Time spend: 40 minutes Code Status: Full Code Family Communication: N/A Disposition Plan:  Home   Witt Plitt A., MD  Pager [phone]  If 7PM-7AM, please contact night-coverage [email or web address] Password [email or web address] 10/15/2012, 10:10 AM

## 2012-10-15 NOTE — Discharge Summary (Signed)
[facility] Discharge Summary   Patient ID: [name] MRN: [id number] DOB/AGE: [birth year] 32 y.o.  Admit date: 10/15/2012 Discharge date: 10/15/2012  Primary Care Physician:  [name],Nyron Mozer A., MD  Admission Diagnoses:  Active Problems: Vaso occlusive crisis with active hemolysis Hypokalemia   Leucocytosis  Hx of PE Acute on chronic pain syndrome    Discharge Diagnoses:   Vaso occlusive crisis with active hemolysis Hypokalemia   Leucocytosis  Hx of PE Acute on chronic pain syndrome    Discharge Medications:    Medication List    ASK your doctor about these medications       celecoxib 200 MG capsule  Commonly known as:  CELEBREX  Take 1 capsule (200 mg total) by mouth 2 (two) times daily as needed for pain.     folic acid 1 MG tablet  Commonly known as:  FOLVITE  Take 1 tablet (1 mg total) by mouth every morning.     HYDROmorphone 4 MG tablet  Commonly known as:  DILAUDID  Take 1 tablet (4 mg total) by mouth every 4 (four) hours as needed for pain.     hydroxyurea 500 MG capsule  Commonly known as:  HYDREA  Take 2 capsules (1,000 mg total) by mouth daily. May take with food to minimize GI side effects.     morphine 15 MG 12 hr tablet  Commonly known as:  MS CONTIN  Take 1 tablet (15 mg total) by mouth 2 (two) times daily.     potassium chloride SA 20 MEQ tablet  Commonly known as:  K-DUR,KLOR-CON  Take 1 tablet (20 mEq total) by mouth every morning.     XARELTO 20 MG Tabs tablet  Generic drug:  Rivaroxaban  Take 20 mg by mouth every morning.     zolpidem 10 MG tablet  Commonly known as:  AMBIEN  Take 1 tablet (10 mg total) by mouth at bedtime as needed for sleep. For sleep         Consults:  None  Significant Diagnostic Studies:  Dg Chest 2 View  09/27/2012   *RADIOLOGY REPORT*  Clinical Data: Sickle cell crisis  CHEST - 2 VIEW  Comparison: 09/20/2012; 08/18/2012; chest CT - 08/27/2012  Findings:  Grossly unchanged  borderline enlarged cardiac silhouette and mediastinal contours.  Stable positioning of support apparatus. There is grossly unchanged mild diffuse slightly nodular thickening of the pulmonary interstitium.  No focal airspace opacities.  No pleural effusion or pneumothorax.  No definite evidence of edema. Grossly unchanged bones.  Post cholecystectomy.  IMPRESSION: Mild bronchitic change without acute cardiopulmonary disease.   Original Report Authenticated By: Tacey Ruiz, MD   Dg Chest 2 View  09/20/2012   *RADIOLOGY REPORT*  Clinical Data: Sickle cell, pain crisis  CHEST - 2 VIEW  Comparison: Prior radiograph from 09/17/2012  Findings: Left-sided Port-A-Cath is stable position with tip overlying the cavoatrial junction.  Heart size is unchanged.  The lungs are normally inflated.  No airspace consolidation, pleural effusion, or pulmonary edema is identified.  There is no pneumothorax.  No acute osseous abnormality.  IMPRESSION: No acute cardiopulmonary process.   Original Report Authenticated By: Rise Mu, M.D.   Dg Chest 2 View  09/17/2012   *RADIOLOGY REPORT*  Clinical Data: Shortness of breath  CHEST - 2 VIEW  Comparison: 09/10/2012  Findings: There is a left chest wall porta-catheter with tip in the cavoatrial junction.  Heart size appears mildly enlarged.  No pleural effusion or edema identified.  No airspace consolidation noted.  Mild spondylosis identified within the thoracic spine.  IMPRESSION:  1.  No acute cardiopulmonary abnormalities.   Original Report Authenticated By: Signa Kell, M.D.     [facility] Course:  For complete details please refer to admission H and P, but in brief, Mr. Alen Bleacher is a 32 y/o [place] male with Hb SS and a PMH:  PE, and hypokalemia.  He woke up in the middle of the night with pain  8/10  in his  lower back, sides and legs.. The pain is throbbing in nature and not associated with any other symptoms. He called the [facility] with  with the above c/o's and he was admitted to the [facility] for the management of his symptoms. Treatment plan included:   Vaso occlusive crisis with active hemolysis: IVF for hydration, pain management hydromorphone 3 mg Q 2 hrs scheduled for 3 dose than prn, antiemetic and antipruritics.  Adjunct therapy heating pad applied to affected areas. Pain is down to 6/10 and manageable at home  Hypokalemia: chronic an additional dose of K+ give today replete. I (pt states takes K dur daily as directed)   Leucocytosis:secondary to the inflammatory/reactive process associated with VOC their is no s/s of infection. Adjunct therapy of Toradol 30mg  Q 6hrs.   Hx of PE: Continue Xareto after d/c   Dehydration:  Clinically he was aggressively hydrated (repleted)  Mild Tachycardia: resolved returned to baseline HR  78.   Acute on chronic pain: expressed while out of town some analgesic where taken he did not report will order ibuprofen 800 mg TID and he may use    hydromorphone sparely. While on schedule ibuprofen hold Celebrex increase risk for GI bleeding. Pt verbalizes understanding to hold Celebrex. ( did not increase Celebrex this would need prior auth while ibuprofen is ready available for this short duration.)  Physical Exam at Discharge:  BP 110/69  Pulse 68  Temp(Src) 98.6 F (37 C) (Oral)  Resp 18  SpO2 97%  Gen: Alert, cooperative, no distress, appears stated age  Cardiovascular: Regular rate and rhythm, S1 and S2 normal, no murmur, rub or gallop  Respiratory:Clear to auscultation bilaterally, respirations unlabored  Gastrointestinal: Soft, non-tender, bowel sounds active all four quadrants,  no masses, no organomegaly  Extremities: normal, atraumatic, no cyanosis or edema  Pulses: 2+ and symmetric all extremities     Disposition at Discharge: 01-Home or Self Care  Discharge Orders:      Future Appointments Provider Department Dept Phone   11/26/2012 1:00 PM Grayce Sessions, NP [place]  [facility] [phone]      Condition at Discharge:   Stable  Time spent on Discharge:  Greater than 30 minutes.  SignedGrayce Sessions 10/15/2012, 5:57 PM
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
## 2012-10-15 NOTE — Progress Notes (Signed)
Patient ID: [name], male   DOB: 11-Jun-1980, 32 y.o.   MRN: [id number] Discharge papers signed, pt flushed with heparin as per protocol, port de-accessed as per protocol. Prescription for ibuprofen given. Pt left ambulating at 1919hrs in no apparent distress.

## 2012-10-17 NOTE — Telephone Encounter (Signed)
Closing encounter

## 2012-10-18 ENCOUNTER — Emergency Department (HOSPITAL_COMMUNITY)
Admission: EM | Admit: 2012-10-18 | Discharge: 2012-10-19 | Disposition: A | Discharge status: Home / Self Care | Payer: Medicare Other | Source: Home / Self Care | Attending: Emergency Medicine | Admitting: Emergency Medicine

## 2012-10-18 ENCOUNTER — Encounter (HOSPITAL_COMMUNITY): Payer: Self-pay | Admitting: Emergency Medicine

## 2012-10-18 DIAGNOSIS — D571 Sickle-cell disease without crisis: Secondary | ICD-10-CM | POA: Insufficient documentation

## 2012-10-18 DIAGNOSIS — Z7901 Long term (current) use of anticoagulants: Secondary | ICD-10-CM | POA: Diagnosis not present

## 2012-10-18 DIAGNOSIS — Z87891 Personal history of nicotine dependence: Secondary | ICD-10-CM

## 2012-10-18 DIAGNOSIS — Z79899 Other long term (current) drug therapy: Secondary | ICD-10-CM | POA: Insufficient documentation

## 2012-10-18 DIAGNOSIS — I2782 Chronic pulmonary embolism: Secondary | ICD-10-CM | POA: Diagnosis present

## 2012-10-18 DIAGNOSIS — M8700 Idiopathic aseptic necrosis of unspecified bone: Secondary | ICD-10-CM | POA: Diagnosis present

## 2012-10-18 DIAGNOSIS — Z8739 Personal history of other diseases of the musculoskeletal system and connective tissue: Secondary | ICD-10-CM | POA: Insufficient documentation

## 2012-10-18 DIAGNOSIS — D5700 Hb-SS disease with crisis, unspecified: Principal | ICD-10-CM | POA: Diagnosis present

## 2012-10-18 DIAGNOSIS — Z96649 Presence of unspecified artificial hip joint: Secondary | ICD-10-CM | POA: Diagnosis not present

## 2012-10-18 DIAGNOSIS — M25519 Pain in unspecified shoulder: Secondary | ICD-10-CM | POA: Diagnosis present

## 2012-10-18 DIAGNOSIS — Z86711 Personal history of pulmonary embolism: Secondary | ICD-10-CM | POA: Insufficient documentation

## 2012-10-18 DIAGNOSIS — R17 Unspecified jaundice: Secondary | ICD-10-CM | POA: Diagnosis present

## 2012-10-18 DIAGNOSIS — Z8659 Personal history of other mental and behavioral disorders: Secondary | ICD-10-CM | POA: Insufficient documentation

## 2012-10-18 DIAGNOSIS — M87029 Idiopathic aseptic necrosis of unspecified humerus: Secondary | ICD-10-CM | POA: Diagnosis not present

## 2012-10-18 DIAGNOSIS — E876 Hypokalemia: Secondary | ICD-10-CM | POA: Insufficient documentation

## 2012-10-18 DIAGNOSIS — I2699 Other pulmonary embolism without acute cor pulmonale: Secondary | ICD-10-CM | POA: Diagnosis present

## 2012-10-18 DIAGNOSIS — F39 Unspecified mood [affective] disorder: Secondary | ICD-10-CM | POA: Diagnosis not present

## 2012-10-18 DIAGNOSIS — I10 Essential (primary) hypertension: Secondary | ICD-10-CM | POA: Diagnosis present

## 2012-10-18 DIAGNOSIS — IMO0002 Reserved for concepts with insufficient information to code with codable children: Secondary | ICD-10-CM | POA: Diagnosis not present

## 2012-10-18 DIAGNOSIS — J40 Bronchitis, not specified as acute or chronic: Secondary | ICD-10-CM | POA: Diagnosis not present

## 2012-10-18 DIAGNOSIS — R079 Chest pain, unspecified: Secondary | ICD-10-CM | POA: Diagnosis not present

## 2012-10-18 DIAGNOSIS — D72829 Elevated white blood cell count, unspecified: Secondary | ICD-10-CM | POA: Diagnosis present

## 2012-10-18 DIAGNOSIS — Z8619 Personal history of other infectious and parasitic diseases: Secondary | ICD-10-CM | POA: Insufficient documentation

## 2012-10-18 LAB — CBC WITH DIFFERENTIAL/PLATELET
Basophils Absolute: 0.2 10*3/uL — ABNORMAL HIGH (ref 0.0–0.1)
Eosinophils Absolute: 0.2 10*3/uL (ref 0.0–0.7)
HCT: 22.1 % — ABNORMAL LOW (ref 39.0–52.0)
Lymphs Abs: 1.5 10*3/uL (ref 0.7–4.0)
MCH: 31.8 pg (ref 26.0–34.0)
MCHC: 34.4 g/dL (ref 30.0–36.0)
MCV: 92.5 fL (ref 78.0–100.0)
Monocytes Absolute: 1.9 10*3/uL — ABNORMAL HIGH (ref 0.1–1.0)
Neutro Abs: 15.3 10*3/uL — ABNORMAL HIGH (ref 1.7–7.7)
Platelets: 445 10*3/uL — ABNORMAL HIGH (ref 150–400)
RDW: 18.6 % — ABNORMAL HIGH (ref 11.5–15.5)
WBC: 19.1 10*3/uL — ABNORMAL HIGH (ref 4.0–10.5)

## 2012-10-18 LAB — RETICULOCYTES
RBC.: 2.39 MIL/uL — ABNORMAL LOW (ref 4.22–5.81)
Retic Count, Absolute: 377.6 10*3/uL — ABNORMAL HIGH (ref 19.0–186.0)
Retic Ct Pct: 15.8 % — ABNORMAL HIGH (ref 0.4–3.1)

## 2012-10-18 LAB — COMPREHENSIVE METABOLIC PANEL
AST: 26 U/L (ref 0–37)
Albumin: 3.9 g/dL (ref 3.5–5.2)
BUN: 7 mg/dL (ref 6–23)
Calcium: 8.9 mg/dL (ref 8.4–10.5)
Creatinine, Ser: 0.56 mg/dL (ref 0.50–1.35)
Total Bilirubin: 6.2 mg/dL — ABNORMAL HIGH (ref 0.3–1.2)

## 2012-10-18 MED ORDER — SODIUM CHLORIDE 0.9 % IV BOLUS (SEPSIS)
1000.0000 mL | Freq: Once | INTRAVENOUS | Status: AC
  Administered 2012-10-18: 1000 mL via INTRAVENOUS

## 2012-10-18 MED ORDER — SODIUM CHLORIDE 0.9 % IV SOLN
Freq: Once | INTRAVENOUS | Status: AC
  Administered 2012-10-18: 22:00:00 via INTRAVENOUS

## 2012-10-18 MED ORDER — HYDROMORPHONE HCL PF 2 MG/ML IJ SOLN
2.0000 mg | Freq: Once | INTRAMUSCULAR | Status: AC
  Administered 2012-10-18: 2 mg via INTRAVENOUS
  Filled 2012-10-18: qty 1

## 2012-10-18 MED ORDER — DIPHENHYDRAMINE HCL 50 MG/ML IJ SOLN
25.0000 mg | Freq: Once | INTRAMUSCULAR | Status: AC
  Administered 2012-10-18: 25 mg via INTRAVENOUS
  Filled 2012-10-18: qty 1

## 2012-10-18 MED ORDER — HYDROMORPHONE HCL PF 2 MG/ML IJ SOLN
4.0000 mg | Freq: Once | INTRAMUSCULAR | Status: AC
  Administered 2012-10-18: 4 mg via INTRAVENOUS
  Filled 2012-10-18: qty 2

## 2012-10-18 MED ORDER — HEPARIN SOD (PORK) LOCK FLUSH 100 UNIT/ML IV SOLN
500.0000 [IU] | Freq: Once | INTRAVENOUS | Status: AC
  Administered 2012-10-19: 500 [IU]
  Filled 2012-10-18: qty 5

## 2012-10-18 MED ORDER — HYDROMORPHONE HCL PF 2 MG/ML IJ SOLN
2.0000 mg | INTRAMUSCULAR | Status: DC | PRN
Start: 2012-10-18 — End: 2012-10-18
  Administered 2012-10-18: 2 mg via INTRAVENOUS
  Filled 2012-10-18: qty 1

## 2012-10-18 MED ORDER — ONDANSETRON HCL 4 MG/2ML IJ SOLN
4.0000 mg | Freq: Once | INTRAMUSCULAR | Status: AC
  Administered 2012-10-18: 4 mg via INTRAVENOUS
  Filled 2012-10-18: qty 2

## 2012-10-18 NOTE — ED Notes (Signed)
Pt arrived to ED with a complaint of sickle cell pain.  Pt complaints of pain in his back and ribs.  Pt pain started this morning.

## 2012-10-18 NOTE — ED Provider Notes (Signed)
CSN: [id number]     Arrival date & time 10/18/12  1945 History      Chief Complaint  Patient presents with  . Sickle Cell Pain Crisis   The history is provided by the patient. No language interpreter was used.   HPI Comments: [name] is a 32 y.o. male with history of sickle cell anemia who presents to the Emergency Department complaining of sickle cell pain in his back and ribs onset this morning.  Prior to today he had several days of inconsistent discomfort, but symptoms began in earnest today.  Since onset symptoms have been more severe with no relief with taking his typical medication. The pain is sore, nonradiating. There is no new chest pain, dyspnea, lightheadedness, nausea, vomiting.  Past Medical History  Diagnosis Date  . Sickle cell anemia   . Blood transfusion   . 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 Hypokalemia   . Mood disorder   . Pulmonary embolism   . Avascular necrosis   . Leukocytosis     Chronic  . Thrombocytosis     Chronic  . Hypertension   . C. difficile colitis    Past Surgical History  Procedure Laterality Date  . Right hip replacement      08/2006  . Cholecystectomy      01/2008  . Porta cath placement    . Porta cath removal    . Umbilical hernia repair      01/2008  . Excision of left periauricular cyst      10/2009  . Excision of right ear lobe cyst with primary closur      11/2007  . Portacath placement  01/05/2012    Procedure: INSERTION PORT-A-CATH;  Surgeon: Adolph Pollack, MD;  Location: [facility] OR;  Service: General;  Laterality: N/A;  ultrasound guiced port a cath insertion with fluoroscopy   Family History  Problem Relation Age of Onset  . Sickle cell anemia Mother   . Sickle cell anemia Father   . Sickle cell trait Brother    History  Substance Use Topics  . Smoking status: Former Smoker -- 13 years    Types: Cigarettes    Quit date: 07/08/2010  . Smokeless tobacco: Never Used  . Alcohol  Use: No    Review of Systems  Musculoskeletal: Positive for back pain.    Allergies  Morphine and related  Home Medications   Current Outpatient Rx  Name  Route  Sig  Dispense  Refill  . celecoxib (CELEBREX) 200 MG capsule   Oral   Take 200 mg by mouth 2 (two) times daily.         . folic acid (FOLVITE) 1 MG tablet   Oral   Take 1 tablet (1 mg total) by mouth every morning.   30 tablet   11   . HYDROmorphone (DILAUDID) 4 MG tablet   Oral   Take 1 tablet (4 mg total) by mouth every 4 (four) hours as needed for pain.   90 tablet   0   . hydroxyurea (HYDREA) 500 MG capsule   Oral   Take 2 capsules (1,000 mg total) by mouth daily. May take with food to minimize GI side effects.   60 capsule   1   . ibuprofen (ADVIL,MOTRIN) 800 MG tablet   Oral   Take 1 tablet (800 mg total) by mouth every 8 (eight) hours as needed for pain.   60 tablet  0   . morphine (MS CONTIN) 15 MG 12 hr tablet   Oral   Take 1 tablet (15 mg total) by mouth 2 (two) times daily.   60 tablet   0   . potassium chloride SA (K-DUR,KLOR-CON) 20 MEQ tablet   Oral   Take 1 tablet (20 mEq total) by mouth every morning.   30 tablet   3   . Rivaroxaban (XARELTO) 20 MG TABS   Oral   Take 20 mg by mouth every morning.          . zolpidem (AMBIEN) 10 MG tablet   Oral   Take 1 tablet (10 mg total) by mouth at bedtime as needed for sleep. For sleep   30 tablet   1   . azithromycin (ZITHROMAX) 250 MG tablet   Oral   Take 250 mg by mouth daily. zpak 500 mg on day 1 and 250 mg on day 2-5 started 10/13/12          BP 119/67  Pulse 73  Temp(Src) 98.4 F (36.9 C) (Oral)  Resp 20  SpO2 97% Physical Exam  Nursing note and vitals reviewed. Constitutional: He is oriented to person, place, and time. He appears well-developed. No distress.  HENT:  Head: Normocephalic and atraumatic.  Eyes: Conjunctivae and EOM are normal.  Cardiovascular: Normal rate and regular rhythm.   Pulmonary/Chest:  Effort normal. No stridor. No respiratory distress.  Left upper chest port in place, no superficial warmth or erythema  Abdominal: He exhibits no distension.  Musculoskeletal: He exhibits no edema.  Neurological: He is alert and oriented to person, place, and time.  Skin: Skin is warm and dry.  Psychiatric: He has a normal mood and affect.    ED Course  Procedures (including critical care time) Medications - No data to display  DIAGNOSTIC STUDIES: Oxygen Saturation is 97% on room air, normal by my interpretation.    COORDINATION OF CARE: 8:09 PM- Discussed treatment plan with pt and pt agrees to plan.    Labs Review Labs Reviewed - No data to display Imaging Review No results found. After the initial evaluation I reviewed the patient's chart.    Pulse oximetry is 99% on room air normal   10:51 PM Pain improving. 6/10 MDM  No diagnosis found. This patient with sickle cell disease now presents with pain in a typical distribution.  On exam he is awake and alert, afebrile, in no distress.  Patient's labs are consistent for him, and absent chest pain, distress, dyspnea, there is low suspicion for ACS were acute occlusive process.  Patient had significant pain level improvement here was discharged in stable condition to follow up with his primary care physician.  Gerhard Munch, MD 10/18/12 2252

## 2012-10-19 ENCOUNTER — Encounter (HOSPITAL_COMMUNITY): Payer: Self-pay | Admitting: Emergency Medicine

## 2012-10-19 ENCOUNTER — Inpatient Hospital Stay (HOSPITAL_COMMUNITY): Payer: Medicare Other

## 2012-10-19 ENCOUNTER — Inpatient Hospital Stay (HOSPITAL_COMMUNITY)
Admission: EM | Admit: 2012-10-19 | Discharge: 2012-10-27 | DRG: 811 | Disposition: A | Discharge status: Home / Self Care | Payer: Medicare Other | Attending: Internal Medicine | Admitting: Internal Medicine

## 2012-10-19 DIAGNOSIS — I2699 Other pulmonary embolism without acute cor pulmonale: Secondary | ICD-10-CM | POA: Diagnosis present

## 2012-10-19 DIAGNOSIS — Z7901 Long term (current) use of anticoagulants: Secondary | ICD-10-CM

## 2012-10-19 DIAGNOSIS — I10 Essential (primary) hypertension: Secondary | ICD-10-CM

## 2012-10-19 DIAGNOSIS — F39 Unspecified mood [affective] disorder: Secondary | ICD-10-CM

## 2012-10-19 DIAGNOSIS — D5700 Hb-SS disease with crisis, unspecified: Secondary | ICD-10-CM

## 2012-10-19 DIAGNOSIS — D571 Sickle-cell disease without crisis: Secondary | ICD-10-CM

## 2012-10-19 LAB — POCT I-STAT, CHEM 8
Calcium, Ion: 1.23 mmol/L (ref 1.12–1.23)
Glucose, Bld: 89 mg/dL (ref 70–99)
HCT: 23 % — ABNORMAL LOW (ref 39.0–52.0)
Hemoglobin: 7.8 g/dL — ABNORMAL LOW (ref 13.0–17.0)
TCO2: 21 mmol/L (ref 0–100)

## 2012-10-19 LAB — RETICULOCYTES
RBC.: 2.3 MIL/uL — ABNORMAL LOW (ref 4.22–5.81)
Retic Count, Absolute: 393.3 10*3/uL — ABNORMAL HIGH (ref 19.0–186.0)
Retic Ct Pct: 17.1 % — ABNORMAL HIGH (ref 0.4–3.1)

## 2012-10-19 LAB — CBC WITH DIFFERENTIAL/PLATELET
Basophils Relative: 1 % (ref 0–1)
Eosinophils Absolute: 0.4 10*3/uL (ref 0.0–0.7)
Hemoglobin: 7.3 g/dL — ABNORMAL LOW (ref 13.0–17.0)
Lymphs Abs: 3.4 10*3/uL (ref 0.7–4.0)
MCH: 31.7 pg (ref 26.0–34.0)
MCHC: 34.4 g/dL (ref 30.0–36.0)
Monocytes Relative: 12 % (ref 3–12)
Neutro Abs: 13.7 10*3/uL — ABNORMAL HIGH (ref 1.7–7.7)
Neutrophils Relative %: 68 % (ref 43–77)
Platelets: 452 10*3/uL — ABNORMAL HIGH (ref 150–400)
RBC: 2.3 MIL/uL — ABNORMAL LOW (ref 4.22–5.81)

## 2012-10-19 MED ORDER — HYDROXYUREA 500 MG PO CAPS
1000.0000 mg | ORAL_CAPSULE | Freq: Every day | ORAL | Status: DC
  Administered 2012-10-20 – 2012-10-27 (×8): 1000 mg via ORAL
  Filled 2012-10-19 (×10): qty 2

## 2012-10-19 MED ORDER — HYDROMORPHONE HCL PF 2 MG/ML IJ SOLN
3.0000 mg | Freq: Once | INTRAMUSCULAR | Status: AC
  Administered 2012-10-19: 3 mg via INTRAVENOUS
  Filled 2012-10-19: qty 2

## 2012-10-19 MED ORDER — ONDANSETRON HCL 4 MG/2ML IJ SOLN: 4.0000 mg | Freq: Four times a day (QID) | INTRAMUSCULAR | Status: DC | PRN

## 2012-10-19 MED ORDER — ONDANSETRON HCL 4 MG PO TABS: 4.0000 mg | ORAL_TABLET | Freq: Four times a day (QID) | ORAL | Status: DC | PRN

## 2012-10-19 MED ORDER — POTASSIUM CHLORIDE IN NACL 20-0.45 MEQ/L-% IV SOLN
INTRAVENOUS | Status: DC
  Administered 2012-10-19: 17:00:00 via INTRAVENOUS
  Administered 2012-10-20 (×2): 1000 mL via INTRAVENOUS
  Administered 2012-10-21 – 2012-10-26 (×7): via INTRAVENOUS
  Filled 2012-10-19 (×13): qty 1000

## 2012-10-19 MED ORDER — HYDROMORPHONE HCL 4 MG PO TABS: 4.0000 mg | ORAL_TABLET | ORAL | Status: DC | PRN

## 2012-10-19 MED ORDER — HYDROMORPHONE HCL PF 2 MG/ML IJ SOLN
3.0000 mg | INTRAMUSCULAR | Status: DC | PRN
  Administered 2012-10-19 – 2012-10-20 (×8): 3 mg via INTRAVENOUS
  Filled 2012-10-19 (×8): qty 2

## 2012-10-19 MED ORDER — DIPHENHYDRAMINE HCL 50 MG/ML IJ SOLN
12.5000 mg | Freq: Once | INTRAMUSCULAR | Status: AC
  Administered 2012-10-19: 12.5 mg via INTRAVENOUS
  Filled 2012-10-19: qty 1

## 2012-10-19 MED ORDER — HYDROMORPHONE HCL PF 2 MG/ML IJ SOLN
2.0000 mg | Freq: Once | INTRAMUSCULAR | Status: AC
  Administered 2012-10-19: 2 mg via INTRAVENOUS
  Filled 2012-10-19: qty 1

## 2012-10-19 MED ORDER — ACETAMINOPHEN 325 MG PO TABS: 650.0000 mg | ORAL_TABLET | Freq: Four times a day (QID) | ORAL | Status: DC | PRN

## 2012-10-19 MED ORDER — BISACODYL 5 MG PO TBEC: 5.0000 mg | DELAYED_RELEASE_TABLET | Freq: Every day | ORAL | Status: DC | PRN

## 2012-10-19 MED ORDER — ALUM & MAG HYDROXIDE-SIMETH 200-200-20 MG/5ML PO SUSP: 30.0000 mL | Freq: Four times a day (QID) | ORAL | Status: DC | PRN

## 2012-10-19 MED ORDER — MORPHINE SULFATE ER 15 MG PO TBCR
15.0000 mg | EXTENDED_RELEASE_TABLET | Freq: Two times a day (BID) | ORAL | Status: DC
  Administered 2012-10-19 – 2012-10-27 (×16): 15 mg via ORAL
  Filled 2012-10-19 (×16): qty 1

## 2012-10-19 MED ORDER — FOLIC ACID 1 MG PO TABS
1.0000 mg | ORAL_TABLET | Freq: Every morning | ORAL | Status: DC
  Administered 2012-10-20 – 2012-10-27 (×8): 1 mg via ORAL
  Filled 2012-10-19 (×8): qty 1

## 2012-10-19 MED ORDER — ACETAMINOPHEN 650 MG RE SUPP: 650.0000 mg | Freq: Four times a day (QID) | RECTAL | Status: DC | PRN

## 2012-10-19 MED ORDER — KETOROLAC TROMETHAMINE 30 MG/ML IJ SOLN
30.0000 mg | Freq: Four times a day (QID) | INTRAMUSCULAR | Status: AC
  Administered 2012-10-19 – 2012-10-24 (×19): 30 mg via INTRAVENOUS
  Filled 2012-10-19 (×26): qty 1

## 2012-10-19 MED ORDER — RIVAROXABAN 20 MG PO TABS
20.0000 mg | ORAL_TABLET | Freq: Every morning | ORAL | Status: DC
  Administered 2012-10-20 – 2012-10-27 (×8): 20 mg via ORAL
  Filled 2012-10-19 (×8): qty 1

## 2012-10-19 NOTE — ED Notes (Signed)
Report given to [facility]

## 2012-10-19 NOTE — H&P (Signed)
PCP:   [name],[name], MD   Chief Complaint:  Pain  HPI: This is a 32 year old gentleman with known h/o sickle cell disease who present with pain that has been on-going all week. Pain present in back and legs. Ranked today as 8-9/10. He has been taking his home medications with no relief. Yesterday he was in the sickle cell clinic for treatment, he felt better and went home. This AM he was awoken with resurgent pain in legs and back, he came to the ER. He has received IV dilaudid without relief, we were called to admit.    Review of Systems:  The patient denies anorexia, fever, weight loss,, vision loss, decreased hearing, hoarseness, chest pain, syncope, dyspnea on exertion, peripheral edema, balance deficits, hemoptysis, abdominal pain, melena, hematochezia, severe indigestion/heartburn, hematuria, incontinence, genital sores, muscle weakness, suspicious skin lesions, transient blindness, difficulty walking, depression, unusual weight change, abnormal bleeding, enlarged lymph nodes, angioedema, and breast masses.  Past Medical History: Past Medical History  Diagnosis Date  . Sickle cell anemia   . Blood transfusion   . 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 Hypokalemia   . Mood disorder   . Pulmonary embolism   . Avascular necrosis   . Leukocytosis     Chronic  . Thrombocytosis     Chronic  . Hypertension   . C. difficile colitis    Past Surgical History  Procedure Laterality Date  . Right hip replacement      08/2006  . Cholecystectomy      01/2008  . Porta cath placement    . Porta cath removal    . Umbilical hernia repair      01/2008  . Excision of left periauricular cyst      10/2009  . Excision of right ear lobe cyst with primary closur      11/2007  . Portacath placement  01/05/2012    Procedure: INSERTION PORT-A-CATH;  Surgeon: Adolph Pollack, MD;  Location: [facility] OR;  Service: General;  Laterality: N/A;  ultrasound guiced port a cath  insertion with fluoroscopy    Medications: Prior to Admission medications   Medication Sig Start Date End Date Taking? Authorizing Provider  azithromycin (ZITHROMAX) 250 MG tablet Take 250 mg by mouth daily. zpak 500 mg on day 1 and 250 mg on day 2-5 started 10/13/12   Yes Historical Provider, MD  folic acid (FOLVITE) 1 MG tablet Take 1 tablet (1 mg total) by mouth every morning. 06/05/12  Yes Altha Harm, MD  HYDROmorphone (DILAUDID) 4 MG tablet Take 1 tablet (4 mg total) by mouth every 4 (four) hours as needed for pain. 09/27/12  Yes Grayce Sessions, NP  hydroxyurea (HYDREA) 500 MG capsule Take 2 capsules (1,000 mg total) by mouth daily. May take with food to minimize GI side effects. 07/12/12  Yes Altha Harm, MD  ibuprofen (ADVIL,MOTRIN) 800 MG tablet Take 1 tablet (800 mg total) by mouth every 8 (eight) hours as needed for pain. 10/15/12  Yes Grayce Sessions, NP  morphine (MS CONTIN) 15 MG 12 hr tablet Take 1 tablet (15 mg total) by mouth 2 (two) times daily. 09/27/12  Yes Grayce Sessions, NP  potassium chloride SA (K-DUR,KLOR-CON) 20 MEQ tablet Take 1 tablet (20 mEq total) by mouth every morning. 07/31/12  Yes Altha Harm, MD  Rivaroxaban (XARELTO) 20 MG TABS Take 20 mg by mouth every morning.    Yes Historical Provider, MD  zolpidem (AMBIEN) 10 MG tablet Take 1 tablet (10 mg total) by mouth at bedtime as needed for sleep. For sleep 09/26/12  Yes Grayce Sessions, NP    Allergies:   Allergies  Allergen Reactions  . Morphine And Related Hives and Rash    Pt states he can take the pill form, but not IV and he is able to tolerate Dilaudid with no reactions.    Social History:  reports that he quit smoking about 2 years ago. His smoking use included Cigarettes. He smoked 0.00 packs per day for 13 years. He has never used smokeless tobacco. He reports that he does not drink alcohol or use illicit drugs.  Family History: Family History  Problem Relation Age of  Onset  . Sickle cell anemia Mother   . Sickle cell anemia Father   . Sickle cell trait Brother     Physical Exam: Filed Vitals:   10/19/12 1222  BP: 118/70  Pulse: 76  Temp: 99.1 F (37.3 C)  TempSrc: Oral  Resp: 20  SpO2: 95%    General:  Alert and oriented times three, well developed and nourished, no acute distress Eyes: PERRLA, pink conjunctiva, scleral icterus ENT: Moist oral mucosa, neck supple, no thyromegaly Lungs: clear to ascultation, no wheeze, no crackles, no use of accessory muscles Cardiovascular: regular rate and rhythm, no regurgitation, no gallops, no murmurs. No carotid bruits, no JVD Abdomen: soft, positive BS, non-tender, non-distended, no organomegaly, not an acute abdomen GU: not examined Neuro: CN II - XII grossly intact, sensation intact Musculoskeletal: strength 5/5 all extremities, no clubbing, cyanosis or edema. Medi-port in place Skin: no rash, no subcutaneous crepitation, no decubitus Psych: appropriate patient   Labs on Admission:   Recent Labs  10/18/12 2103 10/19/12 1250  NA 137 142  K 3.5 3.8  CL 104 108  CO2 23  --   GLUCOSE 109* 89  BUN 7 7  CREATININE 0.56 0.90  CALCIUM 8.9  --     Recent Labs  10/18/12 2103  AST 26  ALT 20  ALKPHOS 79  BILITOT 6.2*  PROT 7.3  ALBUMIN 3.9   No results found for this basename: LIPASE, AMYLASE,  in the last 72 hours  Recent Labs  10/18/12 2103 10/19/12 1245 10/19/12 1250  WBC 19.1* 20.2*  --   NEUTROABS 15.3* 13.7*  --   HGB 7.6* 7.3* 7.8*  HCT 22.1* 21.2* 23.0*  MCV 92.5 92.2  --   PLT 445* 452*  --    No results found for this basename: CKTOTAL, CKMB, CKMBINDEX, TROPONINI,  in the last 72 hours No components found with this basename: POCBNP,  No results found for this basename: DDIMER,  in the last 72 hours No results found for this basename: HGBA1C,  in the last 72 hours No results found for this basename: CHOL, HDL, LDLCALC, TRIG, CHOLHDL, LDLDIRECT,  in the last 72  hours No results found for this basename: TSH, T4TOTAL, FREET3, T3FREE, THYROIDAB,  in the last 72 hours  Recent Labs  10/18/12 2103 10/19/12 1245  RETICCTPCT 15.8* 17.1*    Micro Results: No results found for this or any previous visit (from the past 240 hour(s)).   Radiological Exams on Admission: No results found.  Assessment/Plan Present on Admission:  .Sickle cell anemia with crisis: admit to floor. -IV dilaudid 3mg  q3 hours an needed -toradol 30mg  IV q6hrs -K pad -IV fluids 1/2 NS with K @ 75cc/hr -CBC, BMP, LDH, retic count in AM -  continue hydroyurea . Acute pulmonary embolism -continue Xarelto . Hypertension -resume home medications   Leukocytosis -chronic, r/o infection  Code status: full code Dispo: home hopefully in next 24-48hours  Ebbie Cherry 10/19/2012, 3:36 PM

## 2012-10-19 NOTE — ED Provider Notes (Signed)
CSN: [id number]     Arrival date & time 10/19/12  1207 History   First MD Initiated Contact with Patient 10/19/12 1217     Chief Complaint  Patient presents with  . Sickle Cell Pain Crisis   (Consider location/radiation/quality/duration/timing/severity/associated sxs/prior Treatment) Patient is a 32 y.o. male presenting with sickle cell pain. The history is provided by the patient.  Sickle Cell Pain Crisis Location:  Chest, back and lower extremity (last seen yesterday for pain crisis and pain improved and then returned this am) Severity:  Severe Onset quality:  Gradual Duration: came back this am. Similar to previous crisis episodes: yes   Timing:  Constant Progression:  Worsening Chronicity:  Chronic Sickle cell genotype:  SS Frequency of attacks:  Monthly History of pulmonary emboli: yes   Context: not alcohol consumption, not change in medication, not dehydration and not infection   Relieved by:  Nothing Worsened by:  Nothing tried Ineffective treatments:  Prescription drugs and rest Associated symptoms: chest pain   Associated symptoms: no congestion, no cough, no fever, no nausea, no shortness of breath, no swelling of legs and no wheezing   Risk factors: frequent pain crises   Risk factors: no frequent admissions for fever, no frequent admissions for pain, no history of acute chest syndrome and not smoking     Past Medical History  Diagnosis Date  . Sickle cell anemia   . Blood transfusion   . 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 Hypokalemia   . Mood disorder   . Pulmonary embolism   . Avascular necrosis   . Leukocytosis     Chronic  . Thrombocytosis     Chronic  . Hypertension   . C. difficile colitis    Past Surgical History  Procedure Laterality Date  . Right hip replacement      08/2006  . Cholecystectomy      01/2008  . Porta cath placement    . Porta cath removal    . Umbilical hernia repair      01/2008  . Excision of  left periauricular cyst      10/2009  . Excision of right ear lobe cyst with primary closur      11/2007  . Portacath placement  01/05/2012    Procedure: INSERTION PORT-A-CATH;  Surgeon: Adolph Pollack, MD;  Location: [facility] OR;  Service: General;  Laterality: N/A;  ultrasound guiced port a cath insertion with fluoroscopy   Family History  Problem Relation Age of Onset  . Sickle cell anemia Mother   . Sickle cell anemia Father   . Sickle cell trait Brother    History  Substance Use Topics  . Smoking status: Former Smoker -- 13 years    Types: Cigarettes    Quit date: 07/08/2010  . Smokeless tobacco: Never Used  . Alcohol Use: No    Review of Systems  Constitutional: Negative for fever.  HENT: Negative for congestion.   Respiratory: Negative for cough, shortness of breath and wheezing.   Cardiovascular: Positive for chest pain.  Gastrointestinal: Negative for nausea.  All other systems reviewed and are negative.    Allergies  Morphine and related  Home Medications   Current Outpatient Rx  Name  Route  Sig  Dispense  Refill  . azithromycin (ZITHROMAX) 250 MG tablet   Oral   Take 250 mg by mouth daily. zpak 500 mg on day 1 and 250 mg on day 2-5 started 10/13/12         .  celecoxib (CELEBREX) 200 MG capsule   Oral   Take 200 mg by mouth 2 (two) times daily.         . folic acid (FOLVITE) 1 MG tablet   Oral   Take 1 tablet (1 mg total) by mouth every morning.   30 tablet   11   . HYDROmorphone (DILAUDID) 4 MG tablet   Oral   Take 1 tablet (4 mg total) by mouth every 4 (four) hours as needed for pain.   90 tablet   0   . hydroxyurea (HYDREA) 500 MG capsule   Oral   Take 2 capsules (1,000 mg total) by mouth daily. May take with food to minimize GI side effects.   60 capsule   1   . ibuprofen (ADVIL,MOTRIN) 800 MG tablet   Oral   Take 1 tablet (800 mg total) by mouth every 8 (eight) hours as needed for pain.   60 tablet   0   . morphine (MS CONTIN) 15 MG  12 hr tablet   Oral   Take 1 tablet (15 mg total) by mouth 2 (two) times daily.   60 tablet   0   . potassium chloride SA (K-DUR,KLOR-CON) 20 MEQ tablet   Oral   Take 1 tablet (20 mEq total) by mouth every morning.   30 tablet   3   . Rivaroxaban (XARELTO) 20 MG TABS   Oral   Take 20 mg by mouth every morning.          . zolpidem (AMBIEN) 10 MG tablet   Oral   Take 1 tablet (10 mg total) by mouth at bedtime as needed for sleep. For sleep   30 tablet   1    BP 118/70  Pulse 76  Temp(Src) 99.1 F (37.3 C) (Oral)  Resp 20  SpO2 95% Physical Exam  Nursing note and vitals reviewed. Constitutional: He is oriented to person, place, and time. He appears well-developed and well-nourished. He appears distressed.  Appears uncomfortable  HENT:  Head: Normocephalic and atraumatic.  Mouth/Throat: Oropharynx is clear and moist.  Eyes: EOM are normal. Pupils are equal, round, and reactive to light. Scleral icterus is present.  Neck: Normal range of motion. Neck supple.  Cardiovascular: Normal rate, regular rhythm and intact distal pulses.   No murmur heard. Pulmonary/Chest: Effort normal and breath sounds normal. No respiratory distress. He has no wheezes. He has no rales. He exhibits tenderness.  Bilateral chest wall tenderness  Abdominal: Soft. He exhibits no distension. There is no tenderness. There is no rebound and no guarding.  Musculoskeletal: Normal range of motion. He exhibits tenderness. He exhibits no edema.  Bilateral thigh and lumbar tenderness  Neurological: He is alert and oriented to person, place, and time.  Skin: Skin is warm and dry. No rash noted. No erythema.  Psychiatric: He has a normal mood and affect. His behavior is normal.    ED Course  Procedures (including critical care time) Labs Review Labs Reviewed  CBC WITH DIFFERENTIAL - Abnormal; Notable for the following:    WBC 20.2 (*)    RBC 2.30 (*)    Hemoglobin 7.3 (*)    HCT 21.2 (*)    RDW 18.8  (*)    Platelets 452 (*)    Neutro Abs 13.7 (*)    Monocytes Absolute 2.5 (*)    Basophils Absolute 0.2 (*)    All other components within normal limits  RETICULOCYTES - Abnormal; Notable for the following:  Retic Ct Pct 17.1 (*)    RBC. 2.30 (*)    Retic Count, Manual 393.3 (*)    All other components within normal limits  POCT I-STAT, CHEM 8 - Abnormal; Notable for the following:    Hemoglobin 7.8 (*)    HCT 23.0 (*)    All other components within normal limits   Imaging Review No results found.   Date: 10/19/2012  Rate: 76  Rhythm: normal sinus rhythm  QRS Axis: normal  Intervals: normal  ST/T Wave abnormalities: nonspecific T wave changes  Conduction Disutrbances:none  Narrative Interpretation:   Old EKG Reviewed: unchanged   MDM   1. Sickle cell crisis     Patient here with a history of sickle cell crisis recently seen yesterday and states pain improved until he woke up this morning around 6 AM and pain returned. He tried taking his Dilaudid and MS Contin at home without improvement in pain. The pain is in its usual place which is bilateral ribs, lower back and thighs. He denies any infectious symptoms or shortness of breath. Vital signs are stable. EKG is unchanged.  We'll start giving patient pain control  1:52 PM Labs not significantly different.  Hb stable at 7.  Retic count 19.  Still having pain down to 7 from 9 and given second round 2:45 PM Pt's pain still 7/10 and has not changed after 2nd dose.  He feels he needs to stay.  Will speak with hospitalist.  Gwyneth Sprout, MD 10/19/12 [phone]

## 2012-10-19 NOTE — ED Notes (Signed)
Pt was here yesterday and states that he was here yesterday and was better last night then today began having pain. States the pain is the same as his sickle cell pain.

## 2012-10-20 LAB — URINALYSIS, ROUTINE W REFLEX MICROSCOPIC
Bilirubin Urine: NEGATIVE
Hgb urine dipstick: NEGATIVE
Ketones, ur: NEGATIVE mg/dL
Nitrite: NEGATIVE
Urobilinogen, UA: 1 mg/dL (ref 0.0–1.0)
pH: 6.5 (ref 5.0–8.0)

## 2012-10-20 LAB — CBC
HCT: 20 % — ABNORMAL LOW (ref 39.0–52.0)
MCH: 30.9 pg (ref 26.0–34.0)
MCHC: 33.5 g/dL (ref 30.0–36.0)
RDW: 19.7 % — ABNORMAL HIGH (ref 11.5–15.5)

## 2012-10-20 LAB — BASIC METABOLIC PANEL
BUN: 6 mg/dL (ref 6–23)
Chloride: 107 mEq/L (ref 96–112)
Creatinine, Ser: 0.59 mg/dL (ref 0.50–1.35)
GFR calc Af Amer: >90 mL/min (ref >90)
Glucose, Bld: 91 mg/dL (ref 70–99)

## 2012-10-20 LAB — RETICULOCYTES: Retic Ct Pct: 18.9 % — ABNORMAL HIGH (ref 0.4–3.1)

## 2012-10-20 MED ORDER — HYDROMORPHONE HCL PF 2 MG/ML IJ SOLN
INTRAMUSCULAR | Status: AC
  Administered 2012-10-20: 1 mg
  Filled 2012-10-20: qty 1

## 2012-10-20 MED ORDER — HYDROMORPHONE HCL PF 2 MG/ML IJ SOLN
4.0000 mg | INTRAMUSCULAR | Status: DC | PRN
  Administered 2012-10-20 – 2012-10-21 (×8): 4 mg via INTRAVENOUS
  Filled 2012-10-20 (×8): qty 2

## 2012-10-20 MED ORDER — SODIUM CHLORIDE 0.9 % IV SOLN
25.0000 mg | Freq: Four times a day (QID) | INTRAVENOUS | Status: DC | PRN
  Administered 2012-10-20 – 2012-10-27 (×13): 25 mg via INTRAVENOUS
  Filled 2012-10-20 (×15): qty 0.5

## 2012-10-20 NOTE — Progress Notes (Signed)
Subjective: Patient seen, c/o pain in ribs B/L since 2AM. Ranks at 8/10. States pain in legs are decreased to 2/10. Pain in back remains without change. No other complaints.  Objective: Weight change:   Intake/Output Summary (Last 24 hours) at 10/20/12 1057 Last data filed at 10/20/12 0953  Gross per 24 hour  Intake 1548.75 ml  Output   1300 ml  Net 248.75 ml   BP 124/74  Pulse 76  Temp(Src) 98.4 F (36.9 C) (Oral)  Resp 16  Ht 6' (1.829 m)  Wt 78.472 kg (173 lb)  BMI 23.46 kg/m2  SpO2 98%  General Appearance:    Alert, cooperative, no distress, appears stated age  Head:    Normocephalic, without obvious abnormality, atraumatic  Eyes:    PERRL, conjunctiva/corneas clear     Nose:   Nares normal, septum midline, mucosa normal, no drainage    or sinus tenderness  Throat:   Lips, mucosa, and tongue normal; teeth and gums normal  Neck:   Supple, symmetrical, trachea midline, no adenopathy;       thyroid:  No enlargement/tenderness/nodules; no carotid   bruit or JVD  Back:     Symmetric, no curvature, ROM normal, no CVA tenderness  Lungs:     Clear to auscultation bilaterally, respirations unlabored  Chest wall:    No tenderness or deformity  Heart:    Regular rate and rhythm, S1 and S2 normal, no murmur, rub   or gallop  Abdomen:     Soft, non-tender, bowel sounds active all four quadrants,    no masses, no organomegaly        Extremities:   Extremities normal, atraumatic, no cyanosis or edema  Pulses:   2+ and symmetric all extremities  Skin:   Skin color, texture, turgor normal, no rashes or lesions  Lymph nodes:   Cervical, supraclavicular, and axillary nodes normal  Neurologic:   CNII-XII intact. Normal strength, sensation and reflexes      throughout    Lab Results:  Recent Labs  10/18/12 2103 10/19/12 1250 10/20/12 0520  NA 137 142 136  K 3.5 3.8 3.8  CL 104 108 107  CO2 23  --  21  GLUCOSE 109* 89 91  BUN 7 7 6   CREATININE 0.56 0.90 0.59  CALCIUM 8.9   --  8.5    Recent Labs  10/18/12 2103  AST 26  ALT 20  ALKPHOS 79  BILITOT 6.2*  PROT 7.3  ALBUMIN 3.9   No results found for this basename: LIPASE, AMYLASE,  in the last 72 hours  Recent Labs  10/18/12 2103 10/19/12 1245 10/19/12 1250 10/20/12 0520  WBC 19.1* 20.2*  --  18.6*  NEUTROABS 15.3* 13.7*  --   --   HGB 7.6* 7.3* 7.8* 6.7*  HCT 22.1* 21.2* 23.0* 20.0*  MCV 92.5 92.2  --  92.2  PLT 445* 452*  --  424*   No results found for this basename: CKTOTAL, CKMB, CKMBINDEX, TROPONINI,  in the last 72 hours No components found with this basename: POCBNP,  No results found for this basename: DDIMER,  in the last 72 hours No results found for this basename: HGBA1C,  in the last 72 hours No results found for this basename: CHOL, HDL, LDLCALC, TRIG, CHOLHDL, LDLDIRECT,  in the last 72 hours No results found for this basename: TSH, T4TOTAL, FREET3, T3FREE, THYROIDAB,  in the last 72 hours  Recent Labs  10/18/12 2103 10/19/12 1245  RETICCTPCT 15.8* 17.1*  Micro Results: Results for [name], [name] (MRN [id number]) as of 10/20/2012 10:59  Ref. Range 10/20/2012 01:38  Color, Urine Latest Range: YELLOW  YELLOW  APPearance Latest Range: CLEAR  CLEAR  Specific Gravity, Urine Latest Range: 1.005-1.030  1.012  pH Latest Range: 5.0-8.0  6.5  Glucose Latest Range: NEGATIVE mg/dL NEGATIVE  Bilirubin Urine Latest Range: NEGATIVE  NEGATIVE  Ketones, ur Latest Range: NEGATIVE mg/dL NEGATIVE  Protein Latest Range: NEGATIVE mg/dL NEGATIVE  Urobilinogen, UA Latest Range: 0.0-1.0 mg/dL 1.0  Nitrite Latest Range: NEGATIVE  NEGATIVE  Leukocytes, UA Latest Range: NEGATIVE  NEGATIVE  Hgb urine dipstick Latest Range: NEGATIVE  NEGATIVE    Studies/Results: Dg Chest 2 View  10/19/2012   *RADIOLOGY REPORT*  Clinical Data: Leukocytosis, sickle cell crisis, nonsmoker, subsequent encounter.  CHEST - 2 VIEW  Comparison: 09/27/2012; 08/18/2012; chest CT - 08/27/2012  Findings: Grossly  unchanged borderline enlarged cardiac silhouette and mediastinal contours.  Stable positioning of support apparatus. There is grossly unchanged mild diffuse slightly nodular thickening of the pulmonary interstitium.  No new focal airspace opacity.  No pleural effusion or pneumothorax.  Grossly unchanged bones.  Post cholecystectomy.  IMPRESSION: Stable findings of borderline cardiomegaly and bronchitic change without acute cardiopulmonary disease.   Original Report Authenticated By: Tacey Ruiz, MD   Dg Chest 2 View  09/27/2012   *RADIOLOGY REPORT*  Clinical Data: Sickle cell crisis  CHEST - 2 VIEW  Comparison: 09/20/2012; 08/18/2012; chest CT - 08/27/2012  Findings:  Grossly unchanged borderline enlarged cardiac silhouette and mediastinal contours.  Stable positioning of support apparatus. There is grossly unchanged mild diffuse slightly nodular thickening of the pulmonary interstitium.  No focal airspace opacities.  No pleural effusion or pneumothorax.  No definite evidence of edema. Grossly unchanged bones.  Post cholecystectomy.  IMPRESSION: Mild bronchitic change without acute cardiopulmonary disease.   Original Report Authenticated By: Tacey Ruiz, MD   Medications: Scheduled Meds: . folic acid  1 mg Oral q morning - 10a  . hydroxyurea  1,000 mg Oral Daily  . ketorolac  30 mg Intravenous Q6H  . morphine  15 mg Oral BID  . Rivaroxaban  20 mg Oral q morning - 10a   Continuous Infusions: . 0.45 % NaCl with KCl 20 mEq / L 1,000 mL (10/20/12 0546)   PRN Meds:.acetaminophen, acetaminophen, alum & mag hydroxide-simeth, bisacodyl, diphenhydrAMINE (BENADRYL) IVPB(SICKLE CELL ONLY), HYDROmorphone (DILAUDID) injection, HYDROmorphone, ondansetron (ZOFRAN) IV, ondansetron  Assessment/Plan: @PROBHOSP @  LOS: 1 day   Present on Admission:  .Sickle cell anemia with crisis: admit to floor.  -IV dilaudid increased to 4mg  q2 hours an needed  -toradol 30mg  IV q6hrs  -K pad  -IV fluids 1/2 NS with K @  75cc/hr  -CBC, BMP, LDH, retic count in AM  -continue hydroyurea -continue to monitor . Acute pulmonary embolism  -continue Xarelto . Hypertension  -resume home medications  Leukocytosis  -chronic, UA and CXR negative   Code status: full code  Dispo: Home, hopefully in next 24hours   [name] 10/20/2012, 10:57 AM

## 2012-10-20 NOTE — Plan of Care (Signed)
Problem: Phase I Progression Outcomes Goal: Voiding Quantity Sufficient Outcome: Completed/Met Date Met:  10/20/12 Not saving all urine

## 2012-10-20 NOTE — Progress Notes (Addendum)
CRITICAL VALUE ALERT  Critical value received:  hgb 6.7  Date of notification:  10/20/12  Time of notification:  0550  Critical value read back:yes  Nurse who received alert:  Greig Right   MD notified (1st page):  Elray Mcgregor NP   Time of first page:  0605  MD notified (2nd page):  Time of second page:  Responding MD:  Elray Mcgregor np   Time MD responded:  0620-no new orders at present

## 2012-10-21 LAB — COMPREHENSIVE METABOLIC PANEL
BUN: 6 mg/dL (ref 6–23)
CO2: 22 mEq/L (ref 19–32)
Calcium: 8.6 mg/dL (ref 8.4–10.5)
Chloride: 105 mEq/L (ref 96–112)
Creatinine, Ser: 0.57 mg/dL (ref 0.50–1.35)
GFR calc Af Amer: >90 mL/min (ref >90)
GFR calc non Af Amer: >90 mL/min (ref >90)
Glucose, Bld: 103 mg/dL — ABNORMAL HIGH (ref 70–99)
Total Bilirubin: 6.9 mg/dL — ABNORMAL HIGH (ref 0.3–1.2)

## 2012-10-21 LAB — CBC
HCT: 19.2 % — ABNORMAL LOW (ref 39.0–52.0)
Hemoglobin: 6.6 g/dL — CL (ref 13.0–17.0)
MCH: 31.6 pg (ref 26.0–34.0)
MCV: 91.9 fL (ref 78.0–100.0)
Platelets: 412 10*3/uL — ABNORMAL HIGH (ref 150–400)
RBC: 2.09 MIL/uL — ABNORMAL LOW (ref 4.22–5.81)
WBC: 19.7 10*3/uL — ABNORMAL HIGH (ref 4.0–10.5)

## 2012-10-21 MED ORDER — HYDROMORPHONE HCL PF 4 MG/ML IJ SOLN
4.0000 mg | INTRAMUSCULAR | Status: DC | PRN
  Administered 2012-10-21 – 2012-10-23 (×27): 4 mg via INTRAVENOUS
  Filled 2012-10-21 (×28): qty 1

## 2012-10-21 NOTE — Progress Notes (Signed)
Subjective: Patient seen, still with pain in bilateral ribs and back, states unchanged from yesterday. Patient states pain is intermittent it occurs at unpredictable times, is very sharp lasts for approximately 10 minutes then goes on to dull ache. Currently states the discomfort is 7/10. He denies any nausea or vomiting.  Objective: Weight change:   Intake/Output Summary (Last 24 hours) at 10/21/12 0754 Last data filed at 10/21/12 0500  Gross per 24 hour  Intake   3010 ml  Output   2900 ml  Net    110 ml   BP 125/86  Pulse 86  Temp(Src) 98.9 F (37.2 C) (Oral)  Resp 18  Ht 6' (1.829 m)  Wt 78.472 kg (173 lb)  BMI 23.46 kg/m2  SpO2 94%  General Appearance:    Alert, cooperative, some discomfort, appears stated age  Head:    Normocephalic, without obvious abnormality, atraumatic  Eyes:    PERRL,  scleral icterus [chronic]        Nose:   Nares normal, septum midline, mucosa normal, no drainage    or sinus tenderness  Throat:   Lips, mucosa, and tongue normal; teeth and gums normal  Neck: a soft    Supple, symmetrical, trachea midline, no adenopathy;       thyroid:  No enlargement/tenderness/nodules; no carotid   bruit or JVD  Back:     Symmetric, no curvature, ROM normal, no CVA tenderness  Lungs:     Clear to auscultation bilaterally, respirations unlabored  Chest wall:    No tenderness or deformity  Heart:    Regular rate and rhythm, S1 and S2 normal, no murmur, rub   or gallop  Abdomen:     Soft, non-tender, bowel sounds active all four quadrants,    no masses, no organomegaly        Extremities:   Extremities normal, atraumatic, no cyanosis or edema  Pulses:   2+ and symmetric all extremities  Skin:   Skin color, texture, turgor normal, no rashes or lesions  Lymph nodes:   Cervical, supraclavicular, and axillary nodes normal  Neurologic:   CNII-XII intact. Normal strength, sensation and reflexes      throughout    Lab Results:  Recent Labs  10/20/12 0520  10/21/12 0458  NA 136 134*  K 3.8 3.6  CL 107 105  CO2 21 22  GLUCOSE 91 103*  BUN 6 6  CREATININE 0.59 0.57  CALCIUM 8.5 8.6    Recent Labs  10/18/12 2103 10/21/12 0458  AST 26 31  ALT 20 16  ALKPHOS 79 76  BILITOT 6.2* 6.9*  PROT 7.3 6.5  ALBUMIN 3.9 3.7   No results found for this basename: LIPASE, AMYLASE,  in the last 72 hours  Recent Labs  10/18/12 2103 10/19/12 1245  10/20/12 0520 10/21/12 0458  WBC 19.1* 20.2*  --  18.6* 19.7*  NEUTROABS 15.3* 13.7*  --   --   --   HGB 7.6* 7.3*  < > 6.7* 6.6*  HCT 22.1* 21.2*  < > 20.0* 19.2*  MCV 92.5 92.2  --  92.2 91.9  PLT 445* 452*  --  424* 412*  < > = values in this interval not displayed. No results found for this basename: CKTOTAL, CKMB, CKMBINDEX, TROPONINI,  in the last 72 hours No components found with this basename: POCBNP,  No results found for this basename: DDIMER,  in the last 72 hours No results found for this basename: HGBA1C,  in the last 72  hours No results found for this basename: CHOL, HDL, LDLCALC, TRIG, CHOLHDL, LDLDIRECT,  in the last 72 hours No results found for this basename: TSH, T4TOTAL, FREET3, T3FREE, THYROIDAB,  in the last 72 hours  Recent Labs  10/19/12 1245 10/20/12 1135  RETICCTPCT 17.1* 18.9*    Micro Results: No results found for this or any previous visit (from the past 240 hour(s)).  Studies/Results: Dg Chest 2 View  10/19/2012   *RADIOLOGY REPORT*  Clinical Data: Leukocytosis, sickle cell crisis, nonsmoker, subsequent encounter.  CHEST - 2 VIEW  Comparison: 09/27/2012; 08/18/2012; chest CT - 08/27/2012  Findings: Grossly unchanged borderline enlarged cardiac silhouette and mediastinal contours.  Stable positioning of support apparatus. There is grossly unchanged mild diffuse slightly nodular thickening of the pulmonary interstitium.  No new focal airspace opacity.  No pleural effusion or pneumothorax.  Grossly unchanged bones.  Post cholecystectomy.  IMPRESSION: Stable  findings of borderline cardiomegaly and bronchitic change without acute cardiopulmonary disease.   Original Report Authenticated By: Tacey Ruiz, MD   Dg Chest 2 View  09/27/2012   *RADIOLOGY REPORT*  Clinical Data: Sickle cell crisis  CHEST - 2 VIEW  Comparison: 09/20/2012; 08/18/2012; chest CT - 08/27/2012  Findings:  Grossly unchanged borderline enlarged cardiac silhouette and mediastinal contours.  Stable positioning of support apparatus. There is grossly unchanged mild diffuse slightly nodular thickening of the pulmonary interstitium.  No focal airspace opacities.  No pleural effusion or pneumothorax.  No definite evidence of edema. Grossly unchanged bones.  Post cholecystectomy.  IMPRESSION: Mild bronchitic change without acute cardiopulmonary disease.   Original Report Authenticated By: Tacey Ruiz, MD   Medications: Scheduled Meds: . folic acid  1 mg Oral q morning - 10a  . hydroxyurea  1,000 mg Oral Daily  . ketorolac  30 mg Intravenous Q6H  . morphine  15 mg Oral BID  . Rivaroxaban  20 mg Oral q morning - 10a   Continuous Infusions: . 0.45 % NaCl with KCl 20 mEq / L 1,000 mL (10/20/12 2016)   PRN Meds:.acetaminophen, acetaminophen, alum & mag hydroxide-simeth, bisacodyl, diphenhydrAMINE (BENADRYL) IVPB(SICKLE CELL ONLY), HYDROmorphone (DILAUDID) injection, HYDROmorphone, ondansetron (ZOFRAN) IV, ondansetron  Assessment/Plan: @PROBHOSP @  LOS: 2 days    Present on Admission:  .Sickle cell anemia with crisis: -IV dilaudid increased to 4mg  q2 hours an needed  -toradol 30mg  IV q6hrs  -K pad  -IV fluids 1/2 NS with K @ 75cc/hr  -Elevated reticulocyte count of 18, will order LDH for today -continue hydroyurea  -continue to monitor, no change in treatment for today Sickle cell anemia -Transfuse 1 unit packed red blood cells, posterior incision H&H  . Acute pulmonary embolism  -continue Xarelto . Hypertension  -resume home medications  Leukocytosis  -chronic, UA and CXR  negative   Code status: full code  Dispo: Home, hopefully in next 24hours   [place] Every 10/21/2012, 7:54 AM

## 2012-10-22 DIAGNOSIS — F39 Unspecified mood [affective] disorder: Secondary | ICD-10-CM

## 2012-10-22 DIAGNOSIS — I10 Essential (primary) hypertension: Secondary | ICD-10-CM

## 2012-10-22 DIAGNOSIS — Z7901 Long term (current) use of anticoagulants: Secondary | ICD-10-CM

## 2012-10-22 LAB — CBC
HCT: 21.9 % — ABNORMAL LOW (ref 39.0–52.0)
Hemoglobin: 7.6 g/dL — ABNORMAL LOW (ref 13.0–17.0)
RBC: 2.42 MIL/uL — ABNORMAL LOW (ref 4.22–5.81)
WBC: 19.7 10*3/uL — ABNORMAL HIGH (ref 4.0–10.5)

## 2012-10-22 LAB — PREPARE RBC (CROSSMATCH)

## 2012-10-22 NOTE — Progress Notes (Signed)
Subjective: Patient still having pain at 7/10. Had transfusion of 1 unit PRBC yesterday. He has been feeling better today but still not back to his baseline. No fever, no SOB, no NVD. Has been on his Xarelto for history of pulmonary embolism.   Objective: Vital signs in last 24 hours: Temp:  [98.4 F (36.9 C)-99.3 F (37.4 C)] 98.6 F (37 C) (09/22 0500) Pulse Rate:  [83-100] 83 (09/22 0500) Resp:  [16-18] 16 (09/22 0500) BP: (124-141)/(78-89) 135/85 mmHg (09/22 0500) SpO2:  [92 %-98 %] 95 % (09/22 0500) Weight:  [78.881 kg (173 lb 14.4 oz)] 78.881 kg (173 lb 14.4 oz) (09/22 0500) Weight change:  Last BM Date: 10/20/12  Intake/Output from previous day: 09/21 0701 - 09/22 0700 In: 3195 [P.O.:1320; I.V.:1875] Out: 3325 [Urine:3325] Intake/Output this shift:    General appearance: alert, cooperative and no distress Eyes: conjunctivae/corneas clear. PERRL, EOM's intact. Fundi benign. Neck: no adenopathy, no carotid bruit, no JVD, supple, symmetrical, trachea midline and thyroid not enlarged, symmetric, no tenderness/mass/nodules Back: symmetric, no curvature. ROM normal. No CVA tenderness. Resp: clear to auscultation bilaterally Cardio: regular rate and rhythm, S1, S2 normal, no murmur, click, rub or gallop GI: soft, non-tender; bowel sounds normal; no masses,  no organomegaly Extremities: extremities normal, atraumatic, no cyanosis or edema Pulses: 2+ and symmetric Skin: Skin color, texture, turgor normal. No rashes or lesions Neurologic: Grossly normal  Lab Results:  Recent Labs  10/20/12 0520 10/21/12 0458  WBC 18.6* 19.7*  HGB 6.7* 6.6*  HCT 20.0* 19.2*  PLT 424* 412*   BMET  Recent Labs  10/20/12 0520 10/21/12 0458  NA 136 134*  K 3.8 3.6  CL 107 105  CO2 21 22  GLUCOSE 91 103*  BUN 6 6  CREATININE 0.59 0.57  CALCIUM 8.5 8.6    Studies/Results: No results found.  Medications: I have reviewed the patient's current medications.  Assessment/Plan: A  32 yo admitted with sickle cell painful crisis:   #1 Sickle cell painful crisis: Patient has gotten better and is almost at his baseline. Will continue current treatment.  #2 Sickle cell hemolytic crisis: S/P transfusion of 1 unit PRBC. Will re-check H/H and see if more stable.  #3 Hypertension: Controlled mostly. Continue current treatment.  #4 Chronic anticoagulation: Continue Xarelto.    LOS: 3 days   Dovie Kapusta,[name] 10/22/2012, 8:11 AM

## 2012-10-23 LAB — CBC WITH DIFFERENTIAL/PLATELET
Basophils Absolute: 0 10*3/uL (ref 0.0–0.1)
Basophils Relative: 0 % (ref 0–1)
Eosinophils Absolute: 0.9 10*3/uL — ABNORMAL HIGH (ref 0.0–0.7)
Eosinophils Relative: 5 % (ref 0–5)
HCT: 20.2 % — ABNORMAL LOW (ref 39.0–52.0)
Hemoglobin: 7.1 g/dL — ABNORMAL LOW (ref 13.0–17.0)
Lymphocytes Relative: 28 % (ref 12–46)
Lymphs Abs: 4.8 10*3/uL — ABNORMAL HIGH (ref 0.7–4.0)
MCH: 32 pg (ref 26.0–34.0)
MCV: 91 fL (ref 78.0–100.0)
Metamyelocytes Relative: 0 %
Monocytes Absolute: 2.1 10*3/uL — ABNORMAL HIGH (ref 0.1–1.0)
Monocytes Relative: 12 % (ref 3–12)
Myelocytes: 0 %
Promyelocytes Absolute: 0 %
RBC: 2.22 MIL/uL — ABNORMAL LOW (ref 4.22–5.81)
RDW: 22.1 % — ABNORMAL HIGH (ref 11.5–15.5)
WBC: 17.1 10*3/uL — ABNORMAL HIGH (ref 4.0–10.5)
nRBC: 0 /100 WBC

## 2012-10-23 LAB — COMPREHENSIVE METABOLIC PANEL
Albumin: 3.7 g/dL (ref 3.5–5.2)
Alkaline Phosphatase: 83 U/L (ref 39–117)
BUN: 8 mg/dL (ref 6–23)
CO2: 23 mEq/L (ref 19–32)
Calcium: 8.6 mg/dL (ref 8.4–10.5)
Chloride: 106 mEq/L (ref 96–112)
GFR calc Af Amer: >90 mL/min (ref >90)
GFR calc non Af Amer: >90 mL/min (ref >90)
Glucose, Bld: 94 mg/dL (ref 70–99)
Potassium: 4.2 mEq/L (ref 3.5–5.1)
Sodium: 138 mEq/L (ref 135–145)
Total Protein: 6.7 g/dL (ref 6.0–8.3)

## 2012-10-23 MED ORDER — NALOXONE HCL 0.4 MG/ML IJ SOLN: 0.4000 mg | INTRAMUSCULAR | Status: DC | PRN

## 2012-10-23 MED ORDER — SODIUM CHLORIDE 0.9 % IJ SOLN: 9.0000 mL | INTRAMUSCULAR | Status: DC | PRN

## 2012-10-23 MED ORDER — HYDROMORPHONE 0.3 MG/ML IV SOLN
INTRAVENOUS | Status: DC
  Administered 2012-10-23 (×4): via INTRAVENOUS
  Administered 2012-10-23: 6.99 mg via INTRAVENOUS
  Administered 2012-10-24: 11.6 mg via INTRAVENOUS
  Administered 2012-10-24: 6.14 mg via INTRAVENOUS
  Administered 2012-10-24 (×3): via INTRAVENOUS
  Administered 2012-10-24: 5.44 mg via INTRAVENOUS
  Administered 2012-10-24 – 2012-10-25 (×3): via INTRAVENOUS
  Administered 2012-10-25: 7.38 mg via INTRAVENOUS
  Administered 2012-10-25: 3.61 mg via INTRAVENOUS
  Administered 2012-10-25: 16.39 mg via INTRAVENOUS
  Administered 2012-10-25 (×2): via INTRAVENOUS
  Filled 2012-10-23 (×12): qty 25

## 2012-10-23 NOTE — Progress Notes (Signed)
SICKLE CELL SERVICE PROGRESS NOTE  [name] VHQ:[id number] DOB: [birth year] DOA: 10/19/2012 PCP: Gerald Derrick A., MD  Assessment/Plan: Active Problems:   Hypertension   Chronic anticoagulation   Sickle cell anemia with crisis   Acute pulmonary embolism  1. Hb SS with crisis:Pt reports pain still having pain in back and ribs at a level of 7/10 and current treatment is not been effective. Therapy will be changed and  pt placed on a High dose PCA with 1 hour lockout of 5 mg. 2. Anemia: Pt has a baseline Hb of 7-7.5. Will continue to monitor. He may require transfusion prior to discharge.  3. Leukocytosis: Pt has no S/S of infection and I suspect that this is secondary to increased bone marrow activity and inflammatory state associated with crisis. Continue Ketorolac. 4. Psychosocial: Pt has had much improvement psychosocially over the last nine months. He is actively pursuing employment and has had fewer hospitalizations and much shorter length of stays.  Code Status: Full code Family Communication: N/A Disposition Plan:  Home in 24 to 48 nours.  [name] A.  Pager [phone]. If 7PM-7AM, please contact night-coverage.  10/23/2012, 5:53 PM  LOS: 4 days   Brief narrative: This is a 32 year old gentleman with known h/o sickle cell disease who present with pain that has been on-going all week. Pain present in back and legs. Ranked today as 8-9/10. He has been taking his home medications with no relief. Yesterday he was in the sickle cell clinic for treatment, he felt better and went home. This AM he was awoken with resurgent pain in legs and back, he came to the ER. He has received IV dilaudid without relief, we were called to admit.    Consultants:  None  Procedures:  None  Antibiotics:  None  HPI/Subjective: Pt reports pain 7/10 localized to back and ribs. Not improved with current regimen. Last BM yesterday. No Nausea or vomiting.  Objective: Filed Vitals:    10/23/12 1118 10/23/12 1221 10/23/12 1346 10/23/12 1405  BP:   130/89   Pulse:   74   Temp:   99.2 F (37.3 C)   TempSrc:   Oral   Resp: 16 16 16 15   Height:      Weight:      SpO2: 98% 95% 95% 96%   Weight change:   Intake/Output Summary (Last 24 hours) at 10/23/12 1753 Last data filed at 10/23/12 0939  Gross per 24 hour  Intake   1385 ml  Output   1100 ml  Net    285 ml    General: Alert, awake, oriented x3, in mild distress secondary to pain.  HEENT: [place]/AT PEERL, EOMI, Anicteric Heart: Regular rhythm but mildly tachycardic, without murmurs, rubs, gallops.  Lungs: Clear to auscultation, no wheezing or rhonchi noted. No increased vocal fremitus resonant to percussion  Abdomen: Soft, nontender, nondistended, positive bowel sounds, no masses no hepatosplenomegaly noted.  Neuro: No focal neurological deficits noted cranial nerves II through XII grossly intact. Strength normal in bilateral upper and lower extremities. Musculoskeletal: No warm swelling or erythema around joints, no spinal tenderness noted. Psychiatric: Patient alert and oriented x3, good insight and cognition, good recent to remote recall.    Data Reviewed: Basic Metabolic Panel:  Recent Labs Lab 10/18/12 2103 10/19/12 1250 10/20/12 0520 10/21/12 0458 10/23/12 0445  NA 137 142 136 134* 138  K 3.5 3.8 3.8 3.6 4.2  CL 104 108 107 105 106  CO2 23  --  21 22 23  GLUCOSE 109* 89 91 103* 94  BUN 7 7 6 6 8   CREATININE 0.56 0.90 0.59 0.57 0.57  CALCIUM 8.9  --  8.5 8.6 8.6   Liver Function Tests:  Recent Labs Lab 10/18/12 2103 10/21/12 0458 10/23/12 0445  AST 26 31 30   ALT 20 16 18   ALKPHOS 79 76 83  BILITOT 6.2* 6.9* 6.4*  PROT 7.3 6.5 6.7  ALBUMIN 3.9 3.7 3.7   CBC:  Recent Labs Lab 10/18/12 2103 10/19/12 1245 10/19/12 1250 10/20/12 0520 10/21/12 0458 10/22/12 0900 10/23/12 0445  WBC 19.1* 20.2*  --  18.6* 19.7* 19.7* 17.1*  NEUTROABS 15.3* 13.7*  --   --   --   --  9.3*  HGB  7.6* 7.3* 7.8* 6.7* 6.6* 7.6* 7.1*  HCT 22.1* 21.2* 23.0* 20.0* 19.2* 21.9* 20.2*  MCV 92.5 92.2  --  92.2 91.9 90.5 91.0  PLT 445* 452*  --  424* 412* 434* 403*    Studies: Dg Chest 2 View  10/19/2012   *RADIOLOGY REPORT*  Clinical Data: Leukocytosis, sickle cell crisis, nonsmoker, subsequent encounter.  CHEST - 2 VIEW  Comparison: 09/27/2012; 08/18/2012; chest CT - 08/27/2012  Findings: Grossly unchanged borderline enlarged cardiac silhouette and mediastinal contours.  Stable positioning of support apparatus. There is grossly unchanged mild diffuse slightly nodular thickening of the pulmonary interstitium.  No new focal airspace opacity.  No pleural effusion or pneumothorax.  Grossly unchanged bones.  Post cholecystectomy.  IMPRESSION: Stable findings of borderline cardiomegaly and bronchitic change without acute cardiopulmonary disease.   Original Report Authenticated By: Tacey Ruiz, MD     Scheduled Meds: . folic acid  1 mg Oral q morning - 10a  . HYDROmorphone PCA 0.3 mg/mL   Intravenous Q4H  . hydroxyurea  1,000 mg Oral Daily  . ketorolac  30 mg Intravenous Q6H  . morphine  15 mg Oral BID  . Rivaroxaban  20 mg Oral q morning - 10a   Continuous Infusions: . 0.45 % NaCl with KCl 20 mEq / L 20 mL/hr at 10/23/12 1220    Total time spent 40 minutes.

## 2012-10-24 LAB — COMPREHENSIVE METABOLIC PANEL
AST: 29 U/L (ref 0–37)
Albumin: 3.8 g/dL (ref 3.5–5.2)
Alkaline Phosphatase: 82 U/L (ref 39–117)
BUN: 9 mg/dL (ref 6–23)
CO2: 22 mEq/L (ref 19–32)
Chloride: 107 mEq/L (ref 96–112)
Creatinine, Ser: 0.59 mg/dL (ref 0.50–1.35)
GFR calc Af Amer: >90 mL/min (ref >90)
GFR calc non Af Amer: >90 mL/min (ref >90)
Glucose, Bld: 98 mg/dL (ref 70–99)
Potassium: 3.9 mEq/L (ref 3.5–5.1)
Total Bilirubin: 6.2 mg/dL — ABNORMAL HIGH (ref 0.3–1.2)

## 2012-10-24 LAB — CBC WITH DIFFERENTIAL/PLATELET
Basophils Relative: 1 % (ref 0–1)
Eosinophils Relative: 7 % — ABNORMAL HIGH (ref 0–5)
HCT: 19.1 % — ABNORMAL LOW (ref 39.0–52.0)
Hemoglobin: 6.8 g/dL — CL (ref 13.0–17.0)
Lymphocytes Relative: 16 % (ref 12–46)
Lymphs Abs: 2.3 10*3/uL (ref 0.7–4.0)
MCH: 32.1 pg (ref 26.0–34.0)
MCV: 90.1 fL (ref 78.0–100.0)
Monocytes Relative: 14 % — ABNORMAL HIGH (ref 3–12)
Neutro Abs: 8.7 10*3/uL — ABNORMAL HIGH (ref 1.7–7.7)
RDW: 22.1 % — ABNORMAL HIGH (ref 11.5–15.5)
WBC: 14.1 10*3/uL — ABNORMAL HIGH (ref 4.0–10.5)

## 2012-10-24 NOTE — Progress Notes (Signed)
CRITICAL VALUE ALERT  Critical value received: HGB 6.8  Date of notification:  10/24/12  Time of notification:  0630  Critical value read back:Yes  Nurse who received alert:  Kenton Kingfisher, RN MD notified (1st page):  Hospitalist  Time of first page:  0645  MD notified (2nd page): Arby Barrette  Time of second page: 0700  Responding MD:  Arby Barrette  Time MD responded:  [phone]

## 2012-10-24 NOTE — Progress Notes (Signed)
Subjective: Patient still having pain at 5/10. Has had no SOB but has been feeling tired. Has dropped his hemoglobin once again to 6.9. No NVD, no fever, No cough. He has chronically elevated bilirubin with levels between 4-8 in the last 6 months.  Objective: Vital signs in last 24 hours: Temp:  [98.1 F (36.7 C)-99.7 F (37.6 C)] 98.1 F (36.7 C) (09/24 8295) Pulse Rate:  [74-92] 92 (09/23 2206) Resp:  [15-25] 16 (09/24 1207) BP: (119-136)/(75-90) 119/90 mmHg (09/24 0632) SpO2:  [94 %-98 %] 97 % (09/24 1207) FiO2 (%):  [2 %] 2 % (09/24 6213) Weight change:  Last BM Date: 10/23/12  Intake/Output from previous day: 09/23 0701 - 09/24 0700 In: 1664.8 [P.O.:480; I.V.:1084.8; IV Piggyback:100] Out: 2476 [Urine:2475; Stool:1] Intake/Output this shift:    General appearance: alert, cooperative and no distress Eyes: conjunctivae/corneas clear. PERRL, EOM's intact. Fundi benign. Neck: no adenopathy, no carotid bruit, no JVD, supple, symmetrical, trachea midline and thyroid not enlarged, symmetric, no tenderness/mass/nodules Back: symmetric, no curvature. ROM normal. No CVA tenderness. Resp: clear to auscultation bilaterally Cardio: regular rate and rhythm, S1, S2 normal, no murmur, click, rub or gallop GI: soft, non-tender; bowel sounds normal; no masses,  no organomegaly Extremities: extremities normal, atraumatic, no cyanosis or edema Pulses: 2+ and symmetric Skin: Skin color, texture, turgor normal. No rashes or lesions Neurologic: Grossly normal  Lab Results:  Recent Labs  10/23/12 0445 10/24/12 0600  WBC 17.1* 14.1*  HGB 7.1* 6.8*  HCT 20.2* 19.1*  PLT 403* 388   BMET  Recent Labs  10/23/12 0445 10/24/12 0600  NA 138 138  K 4.2 3.9  CL 106 107  CO2 23 22  GLUCOSE 94 98  BUN 8 9  CREATININE 0.57 0.59  CALCIUM 8.6 8.9    Studies/Results: No results found.  Medications: I have reviewed the patient's current medications.  Assessment/Plan: A 32 yo admitted  with sickle cell painful crisis:   #1 Sickle cell painful crisis: Patient has gotten better but not at his baseline. Will continue current treatment.  #2 Sickle cell hemolytic crisis: No evidence of active hemolysis. Will re check in the morning and transfuse if further dropping.  #3 Hypertension: Controlled mostly. Continue current treatment.  #4 Chronic anticoagulation: Continue Xarelto. Counseled on treatment compliance.    LOS: 5 days   [name],[name] 10/24/2012, 1:12 PM

## 2012-10-25 ENCOUNTER — Inpatient Hospital Stay (HOSPITAL_COMMUNITY): Payer: Medicare Other

## 2012-10-25 LAB — TYPE AND SCREEN
ABO/RH(D): O POS
Antibody Screen: NEGATIVE
Unit division: 0

## 2012-10-25 MED ORDER — HYDROMORPHONE 0.3 MG/ML IV SOLN
INTRAVENOUS | Status: DC
  Administered 2012-10-25: 10.66 mg via INTRAVENOUS
  Administered 2012-10-25: 15:00:00 via INTRAVENOUS
  Administered 2012-10-25: 3.49 mg via INTRAVENOUS
  Administered 2012-10-26: 7.48 mg via INTRAVENOUS
  Administered 2012-10-26: 09:00:00 via INTRAVENOUS
  Administered 2012-10-26: 6.93 mg via INTRAVENOUS
  Administered 2012-10-26 (×3): via INTRAVENOUS
  Administered 2012-10-26: 8.99 mg via INTRAVENOUS
  Administered 2012-10-26 (×3): via INTRAVENOUS
  Administered 2012-10-26: 7.99 mg via INTRAVENOUS
  Administered 2012-10-26: 7.49 mg via INTRAVENOUS
  Administered 2012-10-26: 5.49 mg via INTRAVENOUS
  Administered 2012-10-26: 6.16 mg via INTRAVENOUS
  Administered 2012-10-27: 04:00:00 via INTRAVENOUS
  Administered 2012-10-27: 4.05 mg via INTRAVENOUS
  Administered 2012-10-27: 8.01 mg via INTRAVENOUS
  Administered 2012-10-27: 7.49 mg via INTRAVENOUS
  Administered 2012-10-27: 10:00:00 via INTRAVENOUS
  Administered 2012-10-27: 3.11 mg via INTRAVENOUS
  Filled 2012-10-25 (×10): qty 25

## 2012-10-25 NOTE — Progress Notes (Signed)
SICKLE CELL SERVICE PROGRESS NOTE  [name] [name] DOB: 10/23/1980 DOA: 10/19/2012 PCP: Anavi Branscum A., MD  Assessment/Plan: Active Problems:   Hypertension   Chronic anticoagulation   Sickle cell anemia with crisis   Acute pulmonary embolism  1. Hb SS with crisis:Pt reports pain still having pain in back and ribs at a level of 5-6/10. His PCa use in the last 24 hours has been 50.56 mg with 47 demands and 46 deliveries. He is still having considerable pain in hte Left shoulder however it is liklely AVN which will not be affected significantly by IV narcotics. Will obtain x-ray of left shoulder and if AVN with secondary osteoarthritis, will send for G-H injection as out-patient 2. 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 He is now having increased pain in the left shoulder which I suspect is secondary to  3. Anemia: Pt has continued evidence of hemolysis but is hemodynamically stable. Will continue to monitor. No transfusion at present. 4. Leukocytosis:Improved. Likely secondary to acute VOC. 5. Psychosocial: Pt has had much improvement psychosocially over the last nine months. He is actively pursuing employment and has had fewer hospitalizations and much shorter length of stays.  Code Status: Full code Family Communication: N/A Disposition Plan:  Home in 24 to 48 nours.  [place] A[place] A.  Pager [phone]. If 7PM-7AM, please contact night-coverage.  10/25/2012, 11:34 AM  LOS: 6 days   Brief narrative: This is a 32 year old gentleman with known h/o sickle cell disease who present with pain that has been on-going all week. Pain present in back and legs. Ranked today as 8-9/10. He has been taking his home medications with no relief. Yesterday he was in the sickle cell clinic for treatment, he felt better and went home. This AM he was awoken with resurgent pain in legs and back, he came to the ER. He has received IV dilaudid without relief,  we were called to admit.    Consultants:  None  Procedures:  None  Antibiotics:  None  HPI/Subjective: Pt reports pain 5-6/10 localized to left shoulder and ribs.  No Nausea or vomiting. BM yesterday.  Objective: Filed Vitals:   10/25/12 0545 10/25/12 0608 10/25/12 0738 10/25/12 1000  BP: 117/72   121/75  Pulse: 70   71  Temp: 98.8 F (37.1 C)   98.4 F (36.9 C)  TempSrc: Oral   Oral  Resp: 12 19 15 16   Height:      Weight:      SpO2: 96% 98% 97% 98%   Weight change:   Intake/Output Summary (Last 24 hours) at 10/25/12 1134 Last data filed at 10/25/12 0930  Gross per 24 hour  Intake 1381.34 ml  Output   3700 ml  Net -2318.66 ml    General: Alert, awake, oriented x3, in no acute distress secondary to pain.  HEENT: [place]/AT PEERL, EOMI, mild icterus. Heart: Regular rate and  rhythm, without murmurs, rubs, gallops.  Lungs: Clear to auscultation, no wheezing or rhonchi noted. No increased vocal fremitus resonant to percussion  Abdomen: Soft, nontender, nondistended, positive bowel sounds, no masses no hepatosplenomegaly noted.  Neuro: No focal neurological deficits noted cranial nerves II through XII grossly intact. Strength normal in bilateral upper and lower extremities. Musculoskeletal: No warm swelling or erythema around joints, no spinal tenderness noted. Psychiatric: Patient alert and oriented x3, good insight and cognition, good recent to remote recall.    Data Reviewed: Basic Metabolic Panel:   Recent Labs Lab  10/18/12 2103 10/19/12 1250 10/20/12 0520 10/21/12 0458 10/23/12 0445 10/24/12 0600  NA 137 142 136 134* 138 138  K 3.5 3.8 3.8 3.6 4.2 3.9  CL 104 108 107 105 106 107  CO2 23  --  21 22 23 22   GLUCOSE 109* 89 91 103* 94 98  BUN 7 7 6 6 8 9   CREATININE 0.56 0.90 0.59 0.57 0.57 0.59  CALCIUM 8.9  --  8.5 8.6 8.6 8.9   Liver Function Tests:  Recent Labs Lab 10/18/12 2103 10/21/12 0458 10/23/12 0445 10/24/12 0600  AST 26 31 30 29    ALT 20 16 18 20   ALKPHOS 79 76 83 82  BILITOT 6.2* 6.9* 6.4* 6.2*  PROT 7.3 6.5 6.7 7.0  ALBUMIN 3.9 3.7 3.7 3.8   CBC:  Recent Labs Lab 10/18/12 2103 10/19/12 1245  10/20/12 0520 10/21/12 0458 10/22/12 0900 10/23/12 0445 10/24/12 0600  WBC 19.1* 20.2*  --  18.6* 19.7* 19.7* 17.1* 14.1*  NEUTROABS 15.3* 13.7*  --   --   --   --  9.3* 8.7*  HGB 7.6* 7.3*  < > 6.7* 6.6* 7.6* 7.1* 6.8*  HCT 22.1* 21.2*  < > 20.0* 19.2* 21.9* 20.2* 19.1*  MCV 92.5 92.2  --  92.2 91.9 90.5 91.0 90.1  PLT 445* 452*  --  424* 412* 434* 403* 388  < > = values in this interval not displayed.  Studies: Dg Chest 2 View  10/19/2012   *RADIOLOGY REPORT*  Clinical Data: Leukocytosis, sickle cell crisis, nonsmoker, subsequent encounter.  CHEST - 2 VIEW  Comparison: 09/27/2012; 08/18/2012; chest CT - 08/27/2012  Findings: Grossly unchanged borderline enlarged cardiac silhouette and mediastinal contours.  Stable positioning of support apparatus. There is grossly unchanged mild diffuse slightly nodular thickening of the pulmonary interstitium.  No new focal airspace opacity.  No pleural effusion or pneumothorax.  Grossly unchanged bones.  Post cholecystectomy.  IMPRESSION: Stable findings of borderline cardiomegaly and bronchitic change without acute cardiopulmonary disease.   Original Report Authenticated By: Tacey Ruiz, MD     Scheduled Meds: . folic acid  1 mg Oral q morning - 10a  . HYDROmorphone PCA 0.3 mg/mL   Intravenous Q4H  . hydroxyurea  1,000 mg Oral Daily  . morphine  15 mg Oral BID  . Rivaroxaban  20 mg Oral q morning - 10a   Continuous Infusions: . 0.45 % NaCl with KCl 20 mEq / L 20 mL/hr at 10/24/12 1206    Total time spent 33 minutes.

## 2012-10-26 LAB — CBC WITH DIFFERENTIAL/PLATELET
Basophils Absolute: 0.2 10*3/uL — ABNORMAL HIGH (ref 0.0–0.1)
Eosinophils Absolute: 0.8 10*3/uL — ABNORMAL HIGH (ref 0.0–0.7)
Eosinophils Relative: 5 % (ref 0–5)
Lymphs Abs: 3 10*3/uL (ref 0.7–4.0)
MCV: 90.2 fL (ref 78.0–100.0)
Monocytes Relative: 15 % — ABNORMAL HIGH (ref 3–12)
Neutrophils Relative %: 60 % (ref 43–77)
Platelets: 403 10*3/uL — ABNORMAL HIGH (ref 150–400)
RBC: 2.34 MIL/uL — ABNORMAL LOW (ref 4.22–5.81)
RDW: 21.1 % — ABNORMAL HIGH (ref 11.5–15.5)
WBC: 15.7 10*3/uL — ABNORMAL HIGH (ref 4.0–10.5)

## 2012-10-26 LAB — COMPREHENSIVE METABOLIC PANEL
ALT: 21 U/L (ref 0–53)
AST: 40 U/L — ABNORMAL HIGH (ref 0–37)
Albumin: 4.1 g/dL (ref 3.5–5.2)
BUN: 7 mg/dL (ref 6–23)
Calcium: 9 mg/dL (ref 8.4–10.5)
Chloride: 104 mEq/L (ref 96–112)
Creatinine, Ser: 0.57 mg/dL (ref 0.50–1.35)
Sodium: 139 mEq/L (ref 135–145)
Total Bilirubin: 6 mg/dL — ABNORMAL HIGH (ref 0.3–1.2)
Total Protein: 7.7 g/dL (ref 6.0–8.3)

## 2012-10-26 NOTE — H&P (Signed)
This H&P was erroneously filed under Progress Note and note is being copied in it's original form to the heading of H&P.  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 He also had long car ride back from [facility]. He states that he was managing his pain well with his oral analgesics but feels that the immobility associated with his car ride and the change in temperature between [facility] and [place] increased the pain tho the point that it is now unmanageable with oral analgesics. He states that he also noted his eyes were more jaundiced than usual. He rates his pain at 7-8/10 and localizes it to ribs, back and legs. He describes his pain as throbbing. He denies any N/V/D, dizziness, SOB or any other associated symptoms. He is here for  management of simple acute pain with vaso-occlusive crisis.   Pt also states that he is thinking of moving to [place] as he is able to work in that area and likes the weather as well as feeling that he will be supported psychosocially. He states that he likes being able to work and his best options seem to be in [place].     Review of Systems:  Constitutional: No weight loss, night sweats, Fevers, chills, fatigue.   HEENT: No headaches, dizziness, seizures, vision changes, difficulty swallowing,Tooth/dental problems,Sore throat, No sneezing, itching, ear ache, nasal congestion, post nasal drip,   Cardio-vascular: No chest pain, Orthopnea, PND, swelling in lower extremities, anasarca, dizziness, palpitations   GI:  No heartburn, indigestion, abdominal pain, nausea, vomiting, diarrhea, change in bowel habits, loss of appetite   Resp: No shortness of breath with exertion or at rest. No excess mucus, no productive cough, No non-productive cough, No coughing up of blood.No change in color of mucus.No wheezing.No chest wall deformity   Skin: no rash or lesions.   GU: no dysuria, change in color of urine, no urgency or frequency. No flank pain.   Psych: No change in mood or affect. No depression or anxiety. No memory loss.       Past Medical History   Diagnosis  Date   .  Sickle cell anemia     .  Blood transfusion     .  Acute embolism and thrombosis of right internal jugular vein     .  Hypokalemia     .  Mood disorder     .  Pulmonary embolism     .  Avascular necrosis     .  Leukocytosis         Chronic   .  Thrombocytosis         Chronic   .  Hypertension     .  C. difficile colitis      Past Surgical History   Procedure  Laterality  Date   .  Right hip replacement           08/2006   .  Cholecystectomy           01/2008   .  Porta cath placement       .  Porta cath removal       .  Umbilical hernia repair           01/2008   .  Excision of left periauricular cyst           10/2009   .  Excision of right ear lobe cyst with primary closur           11/2007   .  Portacath placement    01/05/2012       Procedure: INSERTION PORT-A-CATH;  Surgeon: Adolph Pollack, MD;  Location: [facility] OR;  Service: General;  Laterality: N/A;  ultrasound guiced port a cath insertion with fluoroscopy    Social History: reports that he quit smoking about 2 years ago. His smoking use included Cigarettes. He smoked 0.00 packs per day for 13 years. He has never used smokeless tobacco. He reports that he does not drink alcohol or use illicit drugs.    Allergies   Allergen  Reactions   .  Morphine And Related  Hives and Rash       Pt states he can take the pill form, but not IV and he is able to tolerate Dilaudid  with no reactions.       Family History   Problem  Relation  Age of Onset   .  Sickle cell anemia  Mother     .  Sickle cell anemia  Father     .  Sickle cell trait  Brother         Prior to Admission medications    Medication  Sig  Start Date  End Date  Taking?  Authorizing Provider   celecoxib (CELEBREX) 200 MG capsule   Take 1 capsule (200 mg total) by mouth 2 (two) times daily as needed for pain.     folic acid (FOLVITE) 1 MG tablet   Take 1 tablet (1 mg total) by mouth every morning.      HYDROmorphone (DILAUDID) 4 MG tablet   Take 1 tablet (4 mg total) by mouth every 4 (four) hours as needed for pain.    hydroxyurea (HYDREA) 500 MG capsule   Take 2 capsules (1,000 mg total) by mouth daily. May take with food to minimize GI side effects.      morphine (MS CONTIN) 15 MG 12 hr tablet   Take 1 tablet (15 mg total) by mouth 2 (two) times daily.    potassium chloride SA (K-DUR,KLOR-CON) 20 MEQ tablet  Take 1 tablet (20 mEq total) by mouth every morning.      Rivaroxaban (XARELTO) 20 MG TABS   Take 20 mg by mouth every morning.        zolpidem (AMBIEN) 10 MG tablet   Take 1 tablet (10 mg total) by mouth at bedtime as needed for sleep. For sleep     Physical Exam: Filed Vitals:     10/15/12 0943   BP:  151/84   Pulse:  99   Temp:  98.2 F (36.8 C)   TempSrc:  Oral   Resp:  20   SpO2:  100%    BP 151/84  Pulse 99  Temp(Src) 98.2 F (36.8 C) (Oral)  Resp 20  SpO2 100%    General Appearance:     Alert, cooperative, no distress, appears stated age   Head:     Normocephalic, without obvious abnormality, atraumatic   Eyes:     PERRL, conjunctiva/corneas  clear, EOM's intact, fundi     benign, both eyes with mild icterus      Throat:    Lips, mucosa, and tongue normal; teeth and gums normal   Neck:    Supple, symmetrical, trachea midline, no adenopathy;        thyroid:  No enlargement/tenderness/nodules; no carotid   bruit or JVD   Back:      Symmetric, no  curvature, ROM normal, no CVA tenderness   Lungs:      Clear to auscultation bilaterally, respirations unlabored   Chest wall:     No tenderness or deformity   Heart:     Regular rate and rhythm, S1 and S2 normal, no murmur, rub   or gallop   Abdomen:      Soft, non-tender, bowel sounds active all four quadrants,     no masses, no organomegaly   Extremities:    Extremities normal, atraumatic, no cyanosis or edema   Pulses:    2+ and symmetric all extremities   Skin:    Skin color, texture, turgor normal, no rashes or lesions   Lymph nodes:    Cervical, supraclavicular, and axillary nodes normal   Neurologic:    CNII-XII intact. Normal strength, sensation and reflexes       throughout        Labs on Admission:      Assessment/Plan: Active Problems:   Hb SS with simple acute pain: Pain not managed with oral analgesics. Pt identifies several triggers for his pain, most associated with exertion and change in temperature. He also has an increase in Jaundiced appearance from his baseline. So will check bilirubin and Hb with CMET  And CBC with diff. Will Hydrate aggressively and treat with scheduled analgesics. If renal function okay will also give a dose of Toradol.    Will also ask CM to speak with patient Re: proposed move t [place].     Time spend: 40 minutes Code Status: Full Code Family Communication: N/A Disposition Plan:  Home    [name],[name], MD          Pager [phone]   If 7PM-7AM, please contact night-coverage [email or web address] Password [email or web address] 10/15/2012, 10:10 AM

## 2012-10-26 NOTE — Discharge Summary (Signed)
PT seen and examined and discussed with NP [name]. I have discussed with patient that my assessment is that he will likely need admission for management of his pain. However, patient does not want to be hospitalized and he has acute derangement and is currently functional despite his pain. However I would not be surprised if he presents for admission secondary to worsening of his crisis.

## 2012-10-26 NOTE — Progress Notes (Signed)
Subjective: Patient still having pain but much improved. He is on Dilaudid PCA now. His pain level is down to 5/10 and is controlled. No SOB, no chest pain, no cough.  Objective: Vital signs in last 24 hours: Temp:  [98.1 F (36.7 C)-98.7 F (37.1 C)] 98.4 F (36.9 C) (09/26 1030) Pulse Rate:  [71-79] 75 (09/26 1030) Resp:  [13-18] 16 (09/26 1154) BP: (108-128)/(70-81) 128/81 mmHg (09/26 1030) SpO2:  [93 %-98 %] 97 % (09/26 1154) FiO2 (%):  [28 %] 28 % (09/26 1154) Weight change:  Last BM Date: 10/25/12  Intake/Output from previous day: 09/25 0701 - 09/26 0700 In: 849 [P.O.:600; I.V.:249] Out: 3200 [Urine:3200] Intake/Output this shift: Total I/O In: 210 [I.V.:160; IV Piggyback:50] Out: 425 [Urine:425]  General appearance: alert, cooperative and no distress Eyes: conjunctivae/corneas clear. PERRL, EOM's intact. Fundi benign. Neck: no adenopathy, no carotid bruit, no JVD, supple, symmetrical, trachea midline and thyroid not enlarged, symmetric, no tenderness/mass/nodules Back: symmetric, no curvature. ROM normal. No CVA tenderness. Resp: clear to auscultation bilaterally Cardio: regular rate and rhythm, S1, S2 normal, no murmur, click, rub or gallop GI: soft, non-tender; bowel sounds normal; no masses,  no organomegaly Extremities: extremities normal, atraumatic, no cyanosis or edema Pulses: 2+ and symmetric Skin: Skin color, texture, turgor normal. No rashes or lesions Neurologic: Grossly normal  Lab Results:  Recent Labs  10/24/12 0600 10/26/12 0545  WBC 14.1* 15.7*  HGB 6.8* 7.3*  HCT 19.1* 21.1*  PLT 388 403*   BMET  Recent Labs  10/24/12 0600 10/26/12 0545  NA 138 139  K 3.9 3.9  CL 107 104  CO2 22 24  GLUCOSE 98 108*  BUN 9 7  CREATININE 0.59 0.57  CALCIUM 8.9 9.0    Studies/Results: Dg Shoulder Left  10/25/2012   CLINICAL DATA:  Left shoulder pain, history sickle cell disease  EXAM: LEFT SHOULDER - 2+ VIEW  COMPARISON:  08/17/2011  FINDINGS: AC  joint alignment normal.  Sclerosis identified within the left humeral head compatible with avascular necrosis.  No left humeral head fracture or collapse identified.  Glenohumeral joint alignment normal.  No acute fracture, dislocation or bone destruction.  Adjacent left ribs appear intact.  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  No new osseous abnormalities.   Electronically Signed   By: Ulyses Southward M.D.   On: 10/25/2012 13:04    Medications: I have reviewed the patient's current medications.  Assessment/Plan: A 32 yo admitted with sickle cell painful crisis:   #1 Sickle cell painful crisis: We will DC Dilaudid PCA today and instead use 4mg  dilaudid push. Will continue with the oral medication so he can go home in the morning.  #2 Sickle cell hemolytic crisis: Hemoglobin more stable. Continue to monitor.  #3 Hypertension: Controlled mostly. Continue current treatment.  #4 Chronic anticoagulation: Continue Xarelto. Counseled on treatment compliance.    LOS: 7 days   Holle Sprick,[name] 10/26/2012, 2:56 PM

## 2012-10-27 IMAGING — CT CT ANGIO CHEST
2 of 7 series · 14 of 36 positions shown · IV contrast (APPLIED)
Comparison: Chest x-ray 11/26/2009.  CTA chest 09/24/2009.

CLINICAL DATA: Left-sided chest pain.  Sickle cell crisis.

CT ANGIOGRAPHY CHEST WITH CONTRAST
TECHNIQUE: Multidetector CT imaging of the chest was performed
using the standard protocol during bolus administration of
intravenous contrast.  Multiplanar CT image reconstructions
including MIPs were obtained to evaluate the vascular anatomy.
Contrast:  100 ml Omnipaque 300

[Series 10: pulm embolism 1.0 b25f thins · axial · 0.72mm/px · z∈[+163,+407]mm · 13 of 286 slices shown]
[im 21/286  lung]
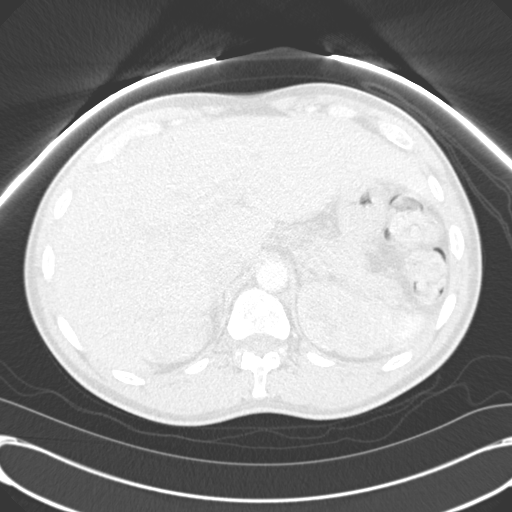
[im 41/286  mediastinal]
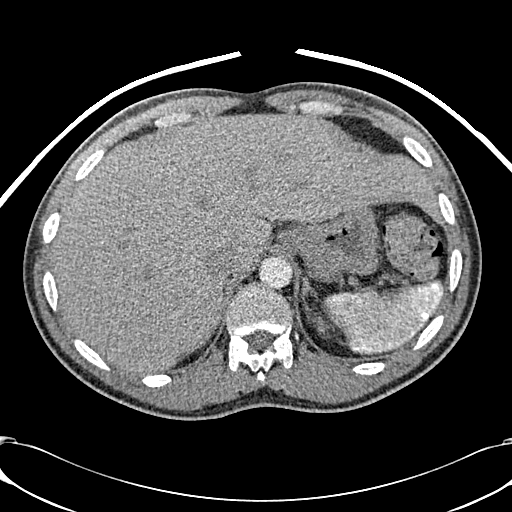
[im 62/286  lung]
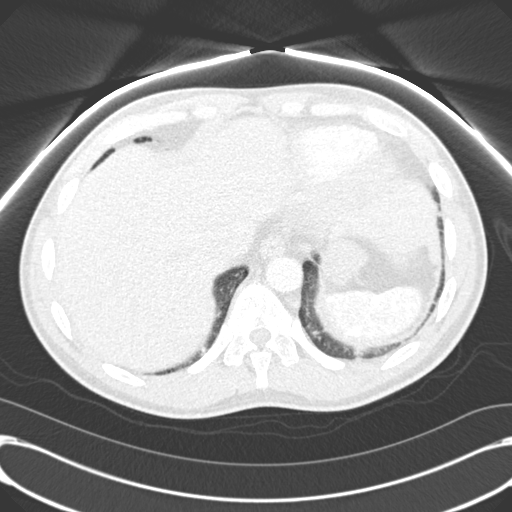
[im 82/286  mediastinal]
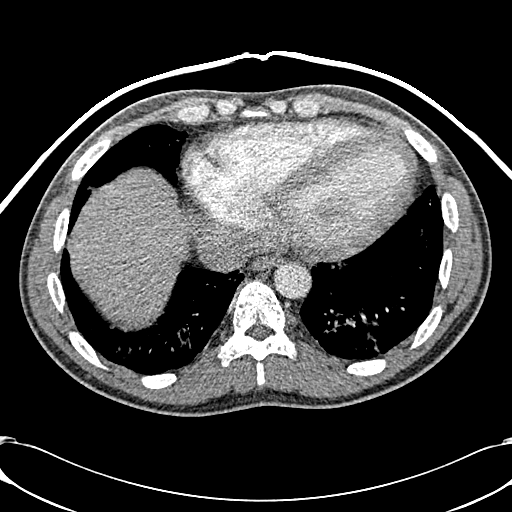
[im 102/286  lung]
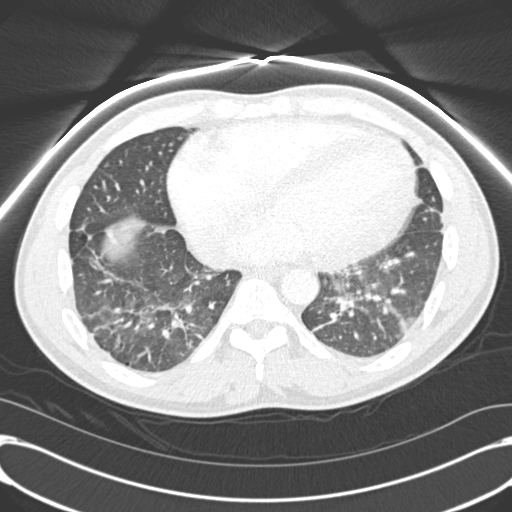
[im 123/286  mediastinal]
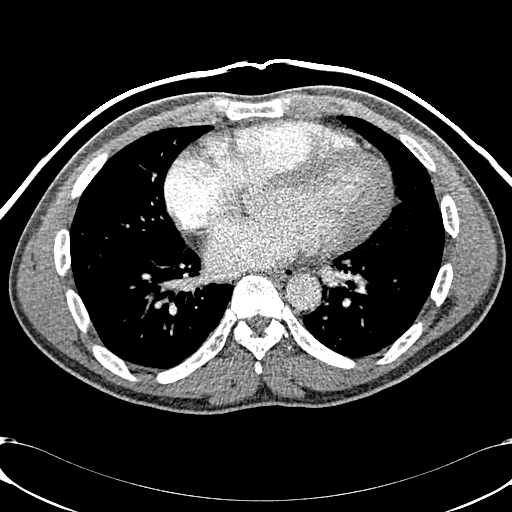
[im 143/286  lung]
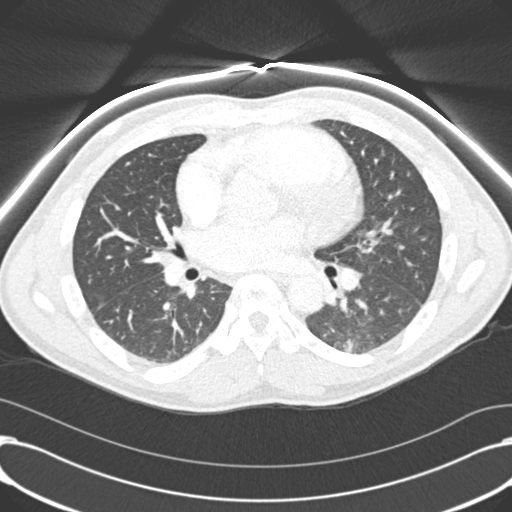
[im 163/286  mediastinal]
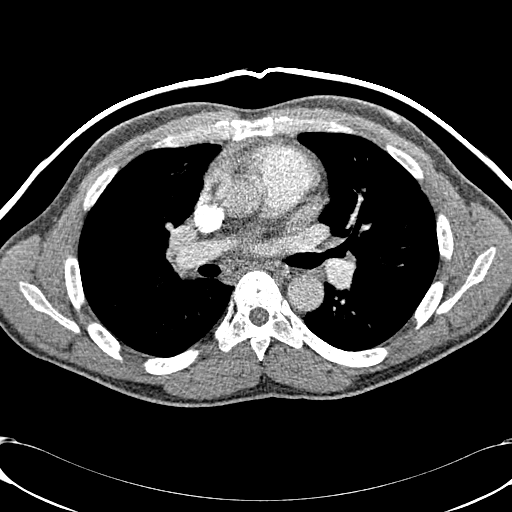
[im 184/286  lung]
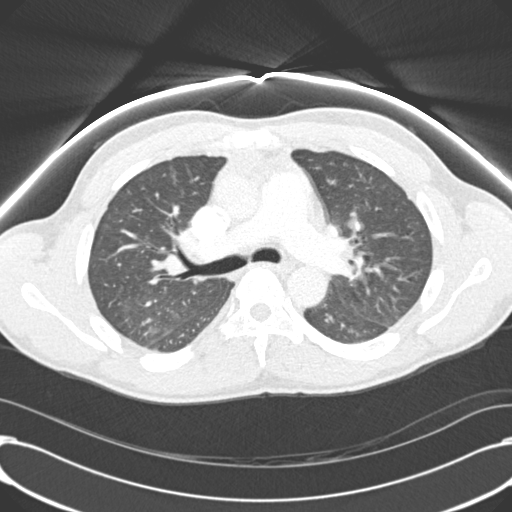
[im 204/286  mediastinal]
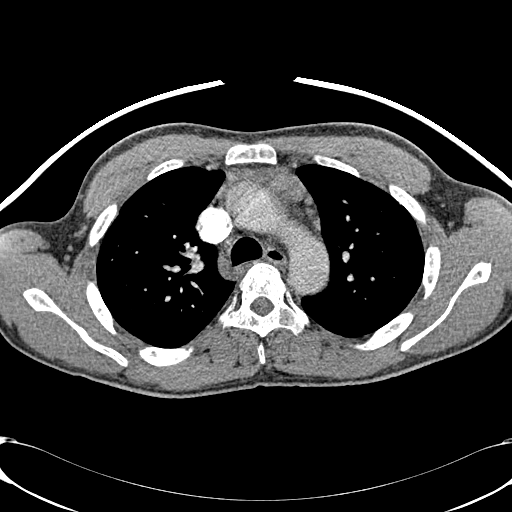
[im 224/286  lung]
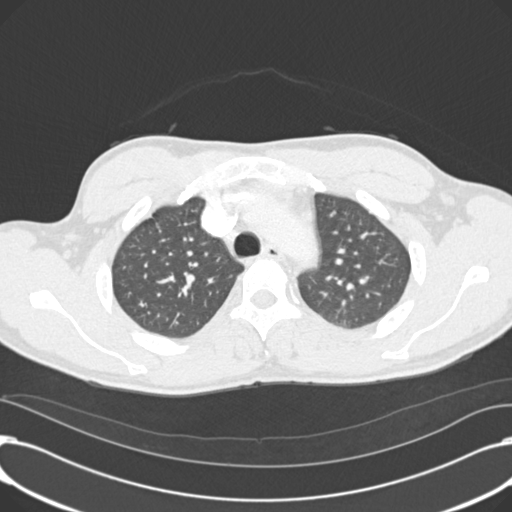
[im 245/286  mediastinal]
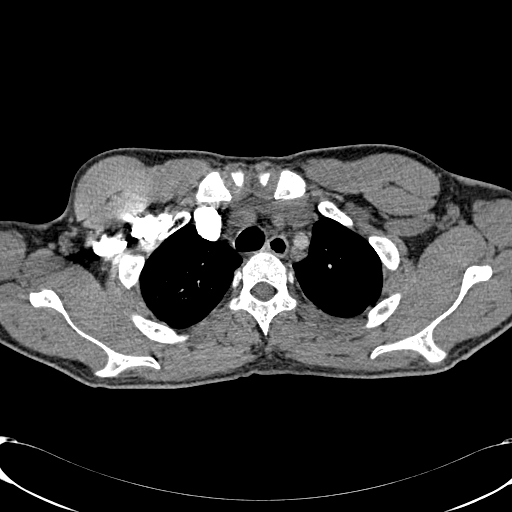
[im 265/286  lung]
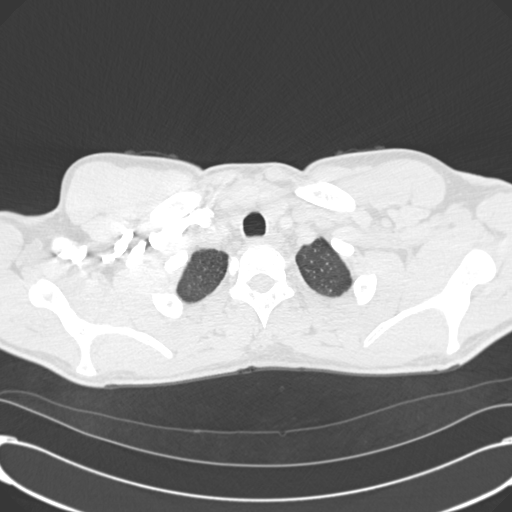

[Series 604: cor mpr · coronal · 0.72mm/px · 1 of 96 slices shown]
[im 48/96  mediastinal]
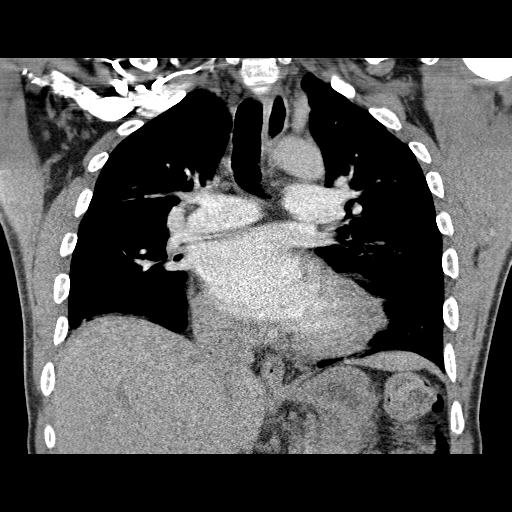

[14 of 36 positions shown; findings below may reference images not displayed]

FINDINGS: Pulmonary arterial opacification is adequate.  No focal
filling defects are present to suggest pulmonary emboli.  The heart
is mildly enlarged.  No significant pleural or pericardial effusion
is present.  Mediastinal bihilar adenopathy is not significantly
changed.

Ill-defined airspace disease is present within the posterior right
upper lobe and bilateral lower lobes.  While some of this reflects
atelectasis or scarring, there is concern for pneumonia,
particularly in the right upper lobe.

Chronic end plate changes are likely related to sickle cell
disease.

Review of the MIP images confirms the above findings.
IMPRESSION: 1.  No evidence for pulmonary embolus.
2.  New right upper lobe and increased bilateral lower lobe
airspace disease is concerning for pneumonia.  There is likely
component of atelectasis or scarring at the bases bilaterally.
3.  Chronic end plate changes of sickle cell disease.

## 2012-10-27 MED ORDER — HEPARIN SOD (PORK) LOCK FLUSH 100 UNIT/ML IV SOLN
500.0000 [IU] | INTRAVENOUS | Status: DC | PRN
  Filled 2012-10-27: qty 5

## 2012-10-27 MED ORDER — MORPHINE SULFATE ER 15 MG PO TBCR: 15.0000 mg | EXTENDED_RELEASE_TABLET | Freq: Two times a day (BID) | ORAL | Status: DC

## 2012-10-27 MED ORDER — HYDROMORPHONE HCL 4 MG PO TABS: 4.0000 mg | ORAL_TABLET | ORAL | Status: DC | PRN

## 2012-10-27 NOTE — Discharge Summary (Signed)
Physician Discharge Summary  Patient ID: [name] MRN: [id number] DOB/AGE: 33-Apr-1982 33 y.o.  Admit date: 10/19/2012 Discharge date: 10/27/2012  Admission Diagnoses:  Discharge Diagnoses:  Active Problems:   Sickle cell anemia with crisis   Hypertension   Chronic anticoagulation   Acute pulmonary embolism   Discharged Condition: good  Hospital Course: Patient admitted with Sickle cell painful crisis. Has had hemolytic crisis as well. He received 1 unit of PRBC and was guaic negative indicating that it was not due to a bled. At the time of discharge, he was back to his baseline. He was asked to follow up with the clinic in 1 month. All his pain medications were refilled before discharge. His blood pressure was also controlled at the time of discharge. Hemoglobin was close to his baseline. His next appointment in the clinic is around October 19th.  Consults: None  Significant Diagnostic Studies: labs: Mainly CBC and CMP  Treatments: IV hydration, analgesia: Dilaudid and Blood transfusion.  Discharge Exam: Blood pressure 120/71, pulse 72, temperature 98.7 F (37.1 C), temperature source Oral, resp. rate 14, height 6' (1.829 m), weight 78.881 kg (173 lb 14.4 oz), SpO2 98.00%. General appearance: alert, cooperative and no distress Eyes: conjunctivae/corneas clear. PERRL, EOM's intact. Fundi benign. Ears: normal TM's and external ear canals both ears Neck: no adenopathy, no carotid bruit, no JVD, supple, symmetrical, trachea midline and thyroid not enlarged, symmetric, no tenderness/mass/nodules Back: negative, symmetric, no curvature. ROM normal. No CVA tenderness. Resp: clear to auscultation bilaterally Cardio: regular rate and rhythm, S1, S2 normal, no murmur, click, rub or gallop GI: soft, non-tender; bowel sounds normal; no masses,  no organomegaly Extremities: extremities normal, atraumatic, no cyanosis or edema  Disposition: 01-Home or Self Care   Future  Appointments Provider Department Dept Phone   11/26/2012 1:00 PM Grayce Sessions, NP [facility] [phone]       Medication List         azithromycin 250 MG tablet  Commonly known as:  ZITHROMAX  Take 250 mg by mouth daily. zpak 500 mg on day 1 and 250 mg on day 2-5 started 10/13/12     folic acid 1 MG tablet  Commonly known as:  FOLVITE  Take 1 tablet (1 mg total) by mouth every morning.     HYDROmorphone 4 MG tablet  Commonly known as:  DILAUDID  Take 1 tablet (4 mg total) by mouth every 4 (four) hours as needed for pain.     hydroxyurea 500 MG capsule  Commonly known as:  HYDREA  Take 2 capsules (1,000 mg total) by mouth daily. May take with food to minimize GI side effects.     ibuprofen 800 MG tablet  Commonly known as:  ADVIL,MOTRIN  Take 1 tablet (800 mg total) by mouth every 8 (eight) hours as needed for pain.     morphine 15 MG 12 hr tablet  Commonly known as:  MS CONTIN  Take 1 tablet (15 mg total) by mouth 2 (two) times daily.     potassium chloride SA 20 MEQ tablet  Commonly known as:  K-DUR,KLOR-CON  Take 1 tablet (20 mEq total) by mouth every morning.     XARELTO 20 MG Tabs tablet  Generic drug:  Rivaroxaban  Take 20 mg by mouth every morning.     zolpidem 10 MG tablet  Commonly known as:  AMBIEN  Take 1 tablet (10 mg total) by mouth at bedtime as needed for sleep. For sleep  SignedLonia Blood 10/27/2012, 1:42 PM

## 2012-10-27 NOTE — Progress Notes (Signed)
Pt. Was discharged home, he was given his discharge instructions, prescriptions, and all questions were answered.  He was transported home by a friend.

## 2012-10-30 IMAGING — CR DG CHEST 2V
2 series · 2 of 2 positions shown · non-contrast
Comparison: Chest CT 12/04/2009 and chest x-ray 11/26/2009

CLINICAL DATA: Shortness of breath.  Sickle cell disease.

CHEST - 2 VIEW

[w chest pa]
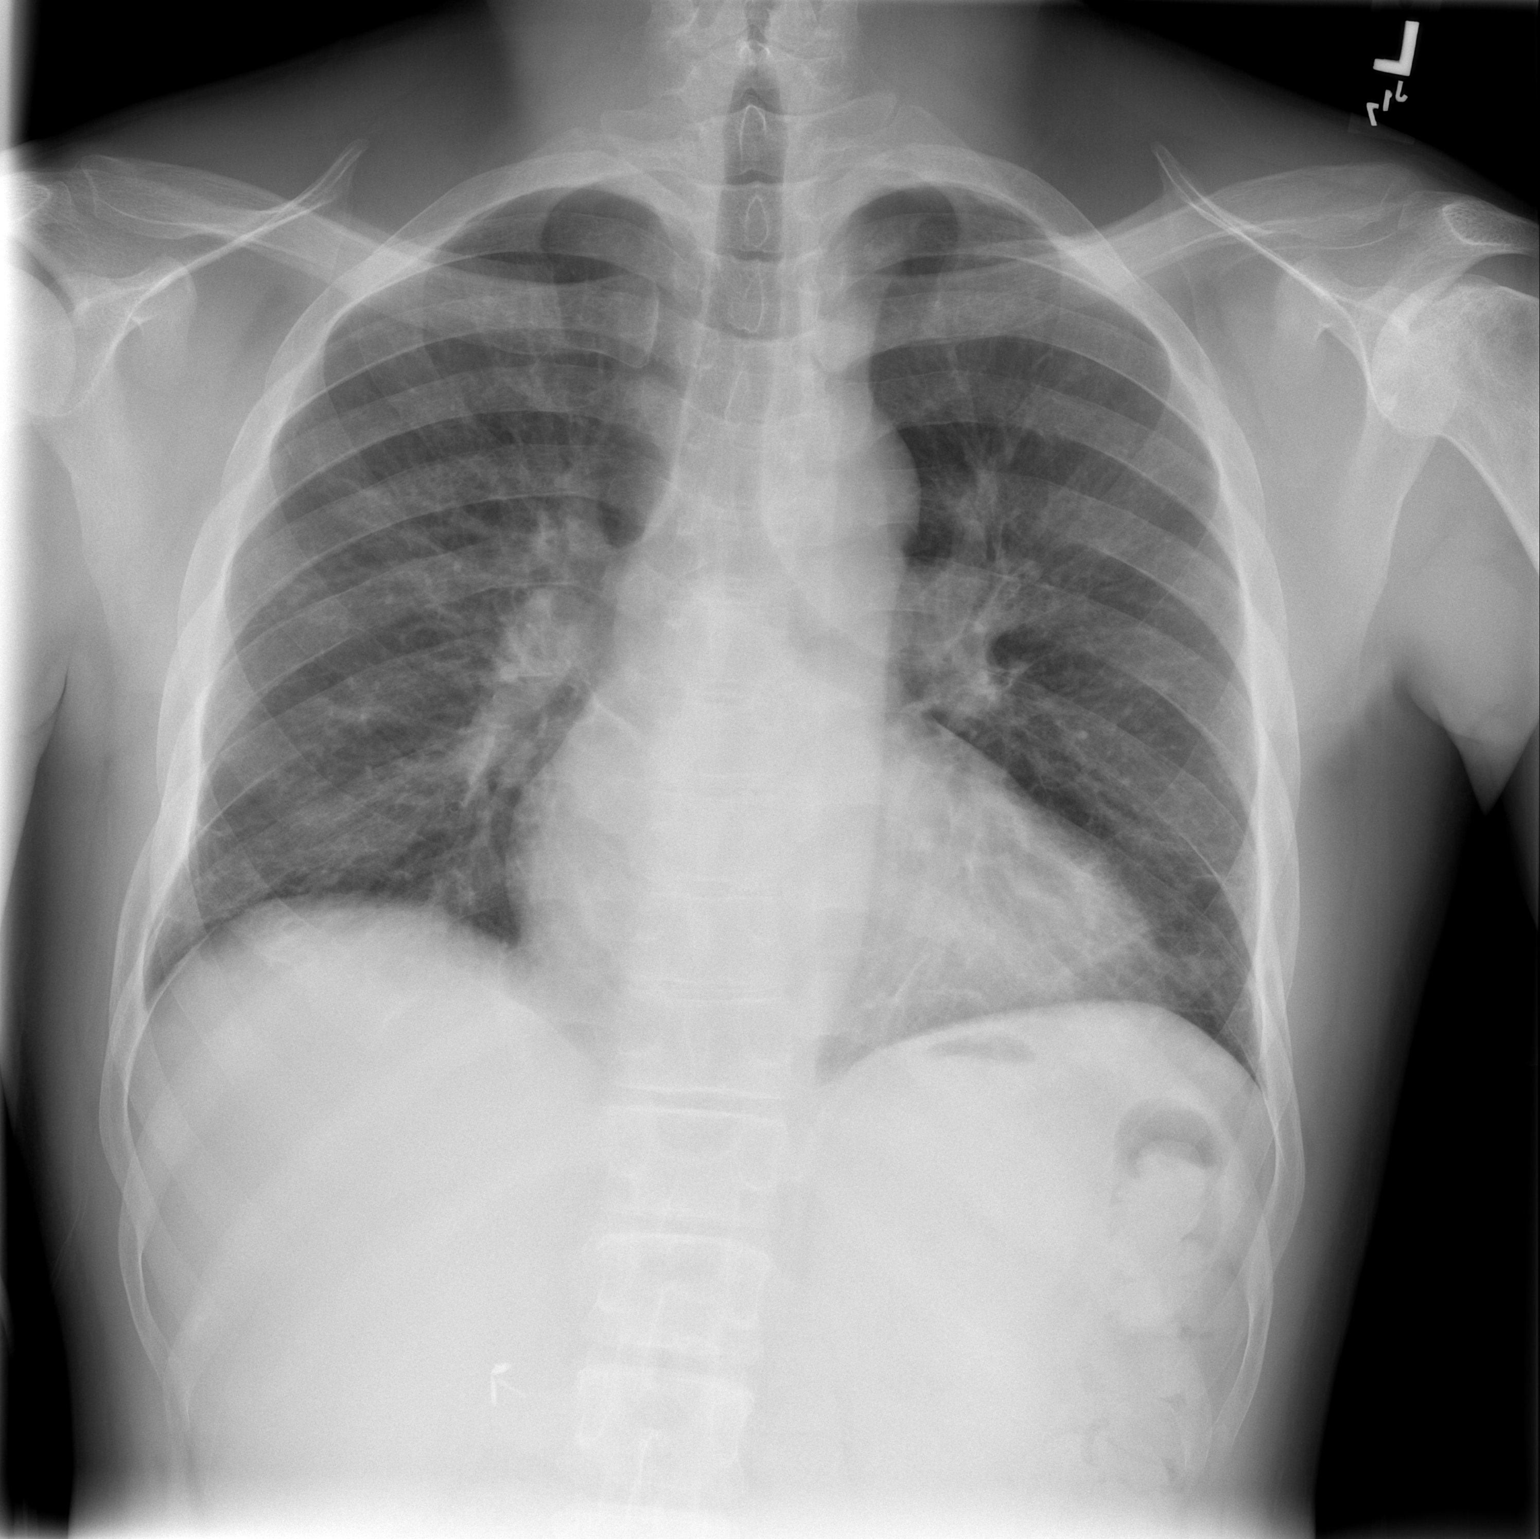

[w chest lat]
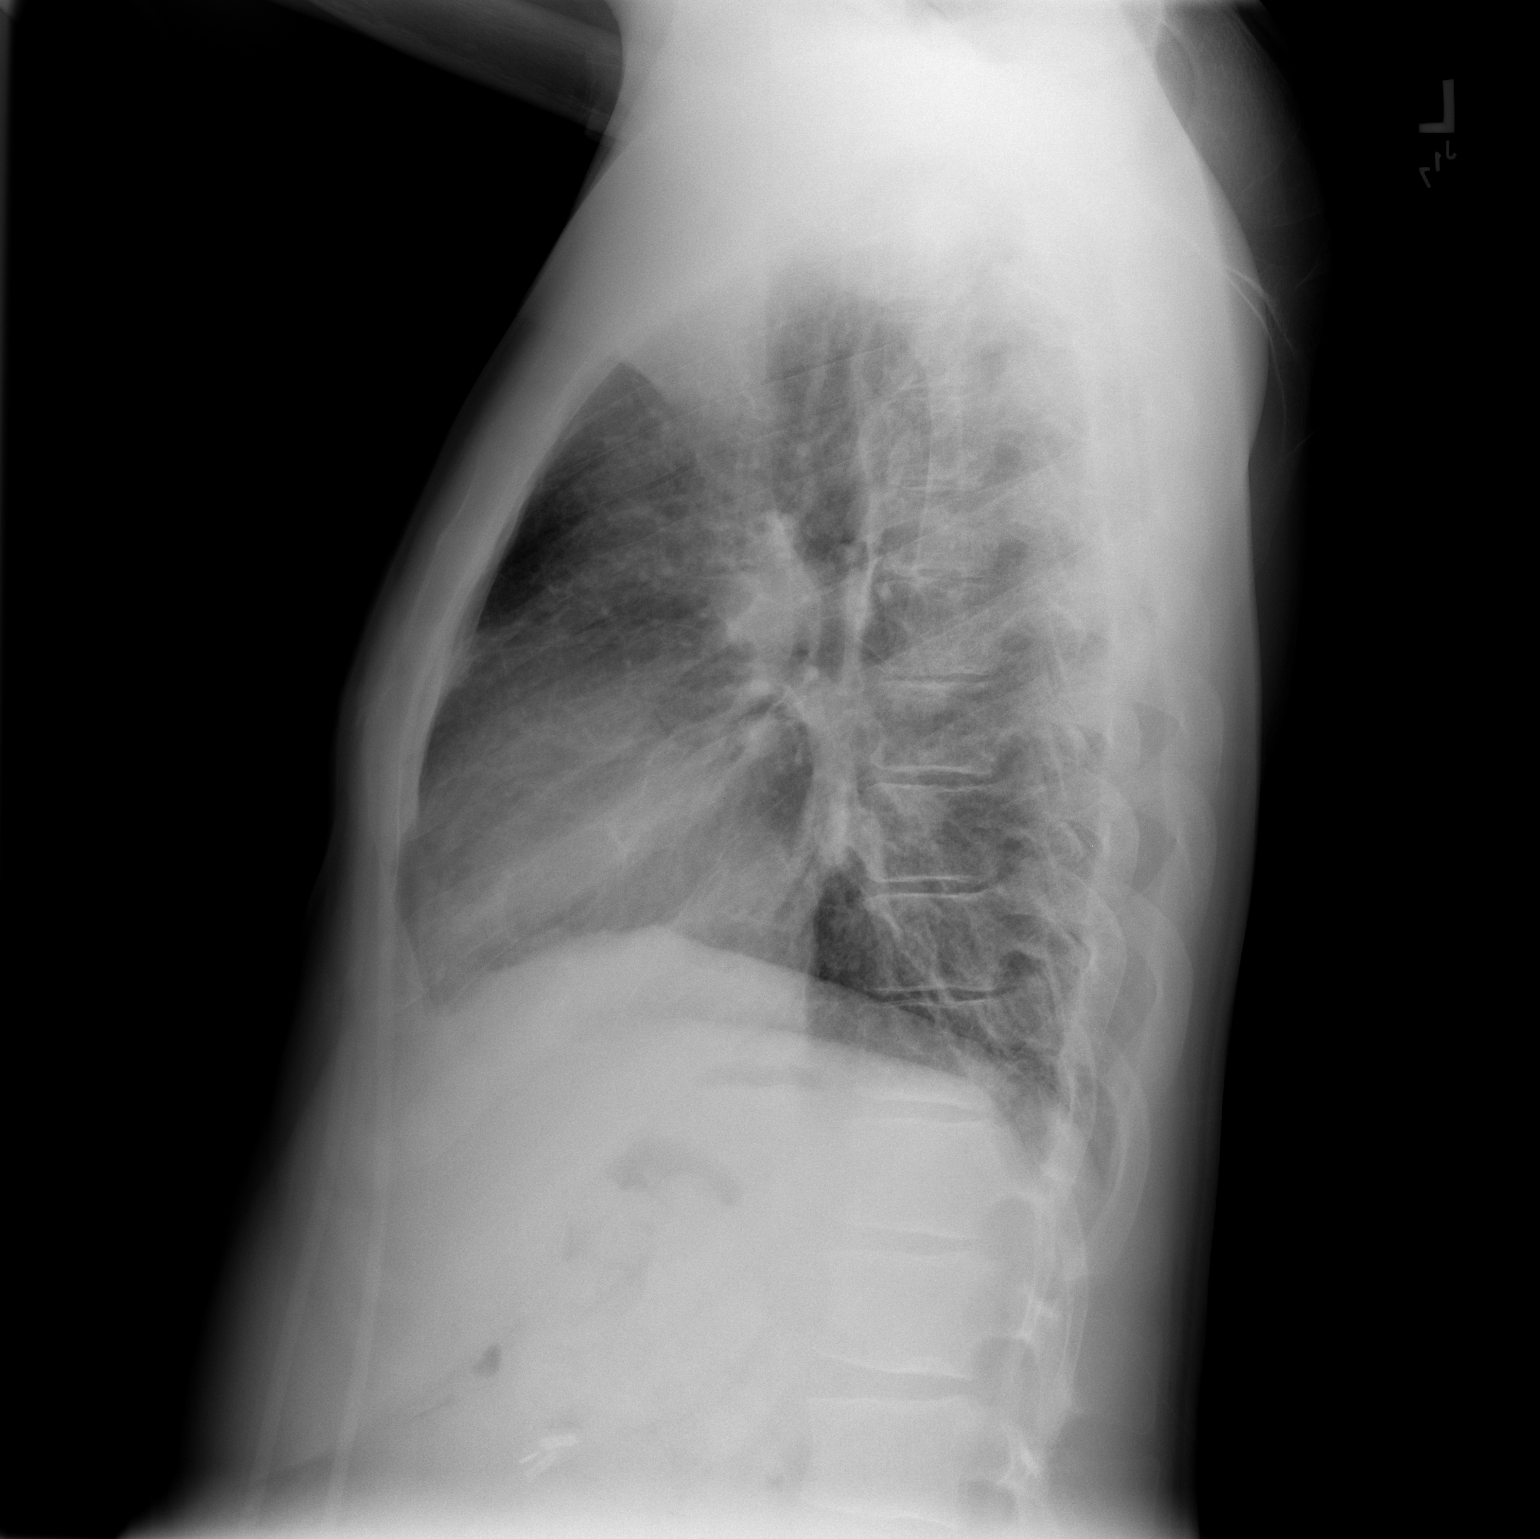

[2 of 2 positions shown; findings below may reference images not displayed]

FINDINGS: The cardiac silhouette, mediastinal and hilar contours
are stable.  There are chronic bronchitic type lung changes with
superimposed patchy airspace disease bilaterally no pleural
effusions or pulmonary edema.  The bony thorax appears stable.
IMPRESSION: Chronic lung changes with minimal patchy superimposed infiltrates.

## 2012-11-08 ENCOUNTER — Non-Acute Institutional Stay (HOSPITAL_COMMUNITY)
Admission: AD | Admit: 2012-11-08 | Discharge: 2012-11-08 | Disposition: A | Discharge status: Home / Self Care | Payer: Medicare Other | Attending: Internal Medicine | Admitting: Internal Medicine

## 2012-11-08 ENCOUNTER — Encounter (HOSPITAL_COMMUNITY): Payer: Self-pay

## 2012-11-08 ENCOUNTER — Telehealth (HOSPITAL_COMMUNITY): Payer: Self-pay | Admitting: Internal Medicine

## 2012-11-08 DIAGNOSIS — I10 Essential (primary) hypertension: Secondary | ICD-10-CM | POA: Insufficient documentation

## 2012-11-08 DIAGNOSIS — E860 Dehydration: Secondary | ICD-10-CM | POA: Diagnosis not present

## 2012-11-08 DIAGNOSIS — D571 Sickle-cell disease without crisis: Secondary | ICD-10-CM | POA: Diagnosis not present

## 2012-11-08 DIAGNOSIS — Z96649 Presence of unspecified artificial hip joint: Secondary | ICD-10-CM | POA: Insufficient documentation

## 2012-11-08 DIAGNOSIS — Z7901 Long term (current) use of anticoagulants: Secondary | ICD-10-CM | POA: Diagnosis not present

## 2012-11-08 DIAGNOSIS — L0291 Cutaneous abscess, unspecified: Secondary | ICD-10-CM | POA: Insufficient documentation

## 2012-11-08 DIAGNOSIS — R52 Pain, unspecified: Secondary | ICD-10-CM | POA: Insufficient documentation

## 2012-11-08 DIAGNOSIS — D5700 Hb-SS disease with crisis, unspecified: Secondary | ICD-10-CM

## 2012-11-08 DIAGNOSIS — D72829 Elevated white blood cell count, unspecified: Secondary | ICD-10-CM | POA: Diagnosis not present

## 2012-11-08 DIAGNOSIS — Z86711 Personal history of pulmonary embolism: Secondary | ICD-10-CM | POA: Insufficient documentation

## 2012-11-08 DIAGNOSIS — Z79899 Other long term (current) drug therapy: Secondary | ICD-10-CM | POA: Diagnosis not present

## 2012-11-08 DIAGNOSIS — R109 Unspecified abdominal pain: Secondary | ICD-10-CM | POA: Diagnosis not present

## 2012-11-08 LAB — CBC WITH DIFFERENTIAL/PLATELET
Basophils Absolute: 0.2 10*3/uL — ABNORMAL HIGH (ref 0.0–0.1)
Eosinophils Absolute: 0.5 10*3/uL (ref 0.0–0.7)
Eosinophils Relative: 3 % (ref 0–5)
HCT: 23 % — ABNORMAL LOW (ref 39.0–52.0)
Lymphocytes Relative: 16 % (ref 12–46)
MCHC: 33.9 g/dL (ref 30.0–36.0)
Monocytes Relative: 12 % (ref 3–12)
Neutro Abs: 12.5 10*3/uL — ABNORMAL HIGH (ref 1.7–7.7)
Platelets: 523 10*3/uL — ABNORMAL HIGH (ref 150–400)
RBC: 2.45 MIL/uL — ABNORMAL LOW (ref 4.22–5.81)
RDW: 19.1 % — ABNORMAL HIGH (ref 11.5–15.5)
WBC: 18.3 10*3/uL — ABNORMAL HIGH (ref 4.0–10.5)

## 2012-11-08 LAB — COMPREHENSIVE METABOLIC PANEL
ALT: 28 U/L (ref 0–53)
Albumin: 3.9 g/dL (ref 3.5–5.2)
Calcium: 8.8 mg/dL (ref 8.4–10.5)
Creatinine, Ser: 0.58 mg/dL (ref 0.50–1.35)
GFR calc Af Amer: >90 mL/min (ref >90)
GFR calc non Af Amer: >90 mL/min (ref >90)
Glucose, Bld: 135 mg/dL — ABNORMAL HIGH (ref 70–99)
Sodium: 137 mEq/L (ref 135–145)
Total Protein: 7.3 g/dL (ref 6.0–8.3)

## 2012-11-08 MED ORDER — POTASSIUM CHLORIDE CRYS ER 20 MEQ PO TBCR: 20.0000 meq | EXTENDED_RELEASE_TABLET | Freq: Every morning | ORAL | Status: DC

## 2012-11-08 MED ORDER — SODIUM CHLORIDE 0.9 % IJ SOLN: 9.0000 mL | INTRAMUSCULAR | Status: DC | PRN

## 2012-11-08 MED ORDER — ONDANSETRON HCL 4 MG/2ML IJ SOLN: 4.0000 mg | Freq: Four times a day (QID) | INTRAMUSCULAR | Status: DC | PRN

## 2012-11-08 MED ORDER — HYDROMORPHONE 0.3 MG/ML IV SOLN
INTRAVENOUS | Status: DC
  Administered 2012-11-08 (×3): via INTRAVENOUS
  Administered 2012-11-08: 7.5 mg via INTRAVENOUS
  Administered 2012-11-08: 6.92 mg via INTRAVENOUS
  Filled 2012-11-08 (×3): qty 25

## 2012-11-08 MED ORDER — CEPHALEXIN 500 MG PO CAPS
500.0000 mg | ORAL_CAPSULE | Freq: Two times a day (BID) | ORAL | Status: DC
  Administered 2012-11-08: 500 mg via ORAL
  Filled 2012-11-08 (×2): qty 1

## 2012-11-08 MED ORDER — RIVAROXABAN 20 MG PO TABS: 20.0000 mg | ORAL_TABLET | Freq: Every morning | ORAL | Status: DC

## 2012-11-08 MED ORDER — DEXTROSE-NACL 5-0.45 % IV SOLN
INTRAVENOUS | Status: DC
  Administered 2012-11-08: 10:00:00 via INTRAVENOUS

## 2012-11-08 MED ORDER — FOLIC ACID 1 MG PO TABS: 1.0000 mg | ORAL_TABLET | Freq: Every morning | ORAL | Status: DC

## 2012-11-08 MED ORDER — HEPARIN SOD (PORK) LOCK FLUSH 100 UNIT/ML IV SOLN
500.0000 [IU] | INTRAVENOUS | Status: DC | PRN
  Filled 2012-11-08: qty 5

## 2012-11-08 MED ORDER — HYDROMORPHONE HCL PF 2 MG/ML IJ SOLN
4.0000 mg | Freq: Once | INTRAMUSCULAR | Status: AC
  Administered 2012-11-08: 4 mg via INTRAVENOUS
  Filled 2012-11-08: qty 2

## 2012-11-08 MED ORDER — KETOROLAC TROMETHAMINE 30 MG/ML IJ SOLN
30.0000 mg | Freq: Four times a day (QID) | INTRAMUSCULAR | Status: DC
Start: 2012-11-08 — End: 2012-11-08
  Administered 2012-11-08: 30 mg via INTRAVENOUS
  Filled 2012-11-08: qty 1

## 2012-11-08 MED ORDER — SODIUM CHLORIDE 0.9 % IJ SOLN
10.0000 mL | INTRAMUSCULAR | Status: AC | PRN
  Administered 2012-11-08: 10 mL

## 2012-11-08 MED ORDER — DIPHENHYDRAMINE HCL 25 MG PO CAPS: 25.0000 mg | ORAL_CAPSULE | Freq: Four times a day (QID) | ORAL | Status: DC | PRN

## 2012-11-08 MED ORDER — HEPARIN SOD (PORK) LOCK FLUSH 100 UNIT/ML IV SOLN
500.0000 [IU] | INTRAVENOUS | Status: AC | PRN
  Administered 2012-11-08: 500 [IU]

## 2012-11-08 MED ORDER — SODIUM CHLORIDE 0.9 % IJ SOLN: 10.0000 mL | INTRAMUSCULAR | Status: DC | PRN

## 2012-11-08 MED ORDER — NALOXONE HCL 0.4 MG/ML IJ SOLN: 0.4000 mg | INTRAMUSCULAR | Status: DC | PRN

## 2012-11-08 MED ORDER — HYDROMORPHONE 0.3 MG/ML IV SOLN: INTRAVENOUS | Status: DC

## 2012-11-08 MED ORDER — HYDROMORPHONE 0.3 MG/ML IV SOLN
INTRAVENOUS | Status: DC
Start: 2012-11-08 — End: 2012-11-08
  Administered 2012-11-08: 6.39 mg via INTRAVENOUS

## 2012-11-08 MED ORDER — MORPHINE SULFATE ER 15 MG PO TBCR: 15.0000 mg | EXTENDED_RELEASE_TABLET | Freq: Two times a day (BID) | ORAL | Status: DC

## 2012-11-08 NOTE — Telephone Encounter (Signed)
This CM spoke with Marylu Lund at [facility] in [place] ph#  [phone], re: a new patient appointment for [name]. Marylu Lund stated all providers in their office see [facility] patients and currently no new patient appointments available until after Novemeber. This CM request that Marylu Lund send a new pt. packet out to [name], and Marylu Lund agreed and will send packet to [name] home address. This CM will follow up with [name].   Karoline Caldwell RN, BSN, [place]    [phone]

## 2012-11-08 NOTE — Progress Notes (Signed)
Pt discharged to home per order; discharge instructions given, explained, and signed; all questions answered; port deaccessed with no complications noted; pt alert and oriented; ambulatory; pt awaiting transportation to home

## 2012-11-08 NOTE — Care Management (Signed)
Patient: [name] DOB :May 30, 1980 MRN :[id number]  Date: 11/08/2012  Documentation Initiated by : Jefm Miles  Subjective/Objective Assessment: [name] is a 32 year old male with known SCD, currently being seen at the [facility] day hospital. [name] stated he woke up this morning in bad pain and didn't feel his home medications were helping. [name] stated he is taking his hydrea, folic acid and exjade in addition to his pain medications. [name] still has plans to transfer his care and move to [facility]. [name] stated him and his mother are researching housing. This CM advised [name] that a PCP [facility] in [place] will be sending him a new patient packet to his current mailing address. [name] verbalized understanding and will contact this CM if he has any questions or concerns.     Barriers to Care: Needing a new PCP once he moves to [place] Prior Approval (PA) #: na PA start date:na PA end date: na   Action/Plan: This CM coordinated to establish care with new provider in [place]. This CM will continue to monitor.  Comments: None  Time spent: 20 minutes Karoline Caldwell, RN, BSN, [place]    [phone]

## 2012-11-08 NOTE — H&P (Signed)
[facility] History and Physical  [name] [name] DOB: 01/20/1981 DOA: 11/08/2012   PCP: Jullien Granquist A., MD   Chief Complaint: Pain in B/L side since last night  HPI:Pt well known with Hb SS presents with c/o of pain in B/L sides consistent with pain of sickle cell disease. He states that pain is 7/10 and throbbing in nature. No associated symptoms and he is unable to identify any palliative or provocative features.    Review of Systems:  Constitutional: No weight loss, night sweats, Fevers, chills, fatigue.  HEENT: No headaches, dizziness, seizures, vision changes, difficulty swallowing,Tooth/dental problems,Sore throat, No sneezing, itching, ear ache, nasal congestion, post nasal drip,  Cardio-vascular: No chest pain, Orthopnea, PND, swelling in lower extremities, anasarca, dizziness, palpitations  GI: No heartburn, indigestion, abdominal pain, nausea, vomiting, diarrhea, change in bowel habits, loss of appetite  Resp: No shortness of breath with exertion or at rest. No excess mucus, no productive cough, No non-productive cough, No coughing up of blood.No change in color of mucus.No wheezing.No chest wall deformity  Skin: no rash or lesions.  GU: no dysuria, change in color of urine, no urgency or frequency. No flank pain.  Psych: No change in mood or affect. No depression or anxiety. No memory loss.    Past Medical History  Diagnosis Date  . Sickle cell anemia   . Blood transfusion   . 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 Hypokalemia   . Mood disorder   . Pulmonary embolism   . Avascular necrosis   . Leukocytosis     Chronic  . Thrombocytosis     Chronic  . Hypertension   . C. difficile colitis    Past Surgical History  Procedure Laterality Date  . Right hip replacement      08/2006  . Cholecystectomy      01/2008  . Porta cath placement    . Porta cath removal    . Umbilical hernia repair      01/2008   . Excision of left periauricular cyst      10/2009  . Excision of right ear lobe cyst with primary closur      11/2007  . Portacath placement  01/05/2012    Procedure: INSERTION PORT-A-CATH;  Surgeon: Adolph Pollack, MD;  Location: [facility] OR;  Service: General;  Laterality: N/A;  ultrasound guiced port a cath insertion with fluoroscopy   Social History:  reports that he quit smoking about 2 years ago. His smoking use included Cigarettes. He smoked 0.00 packs per day for 13 years. He has never used smokeless tobacco. He reports that he does not drink alcohol or use illicit drugs.  Allergies  Allergen Reactions  . Morphine And Related Hives and Rash    Pt states he can take the pill form, but not IV and he is able to tolerate Dilaudid with no reactions.    Family History  Problem Relation Age of Onset  . Sickle cell anemia Mother   . Sickle cell anemia Father   . Sickle cell trait Brother     Prior to Admission medications   Medication Sig Start Date End Date Taking? Authorizing Provider  folic acid (FOLVITE) 1 MG tablet Take 1 tablet (1 mg total) by mouth every morning. 06/05/12  Yes Altha Harm, MD  HYDROmorphone (DILAUDID) 4 MG tablet Take 1 tablet (4 mg total) by mouth every 4 (four) hours as needed for pain. 10/27/12  Yes  Rometta Emery, MD  hydroxyurea (HYDREA) 500 MG capsule Take 2 capsules (1,000 mg total) by mouth daily. May take with food to minimize GI side effects. 07/12/12  Yes Altha Harm, MD  ibuprofen (ADVIL,MOTRIN) 800 MG tablet Take 1 tablet (800 mg total) by mouth every 8 (eight) hours as needed for pain. 10/15/12  Yes Grayce Sessions, NP  morphine (MS CONTIN) 15 MG 12 hr tablet Take 1 tablet (15 mg total) by mouth 2 (two) times daily. 10/27/12  Yes Rometta Emery, MD  potassium chloride SA (K-DUR,KLOR-CON) 20 MEQ tablet Take 1 tablet (20 mEq total) by mouth every morning. 07/31/12  Yes Altha Harm, MD  Rivaroxaban (XARELTO) 20 MG TABS Take 20 mg  by mouth every morning.    Yes Historical Provider, MD  zolpidem (AMBIEN) 10 MG tablet Take 1 tablet (10 mg total) by mouth at bedtime as needed for sleep. For sleep 09/26/12  Yes Grayce Sessions, NP   Physical Exam: Filed Vitals:   11/08/12 0931  BP: 121/71  Pulse: 97  Temp: 98.4 F (36.9 C)  TempSrc: Oral  Resp: 18  SpO2: 100%   BP 106/65  Pulse 68  Temp(Src) 98.4 F (36.9 C) (Oral)  Resp 20  SpO2 99%  General Appearance:    Alert, cooperative, no distress, appears stated age  Head:    Normocephalic, without obvious abnormality, atraumatic  Eyes:    PERRL, conjunctiva/corneas clear, EOM's intact, fundi    benign, both eyes anicteric      Throat:   Lips, mucosa, and tongue normal; teeth and gums normal  Neck:   Supple, symmetrical, trachea midline, no adenopathy;       thyroid:  No enlargement/tenderness/nodules; no carotid   bruit or JVD  Back:     Symmetric, no curvature, ROM normal, no CVA tenderness  Lungs:     Clear to auscultation bilaterally, respirations unlabored  Chest wall:    No tenderness or deformity  Heart:    Regular rate and rhythm, S1 and S2 normal, no murmur, rub   or gallop  Abdomen:     Soft, non-tender, bowel sounds active all four quadrants,    no masses, no organomegaly  Extremities:   Extremities normal, atraumatic, no cyanosis or edema  Pulses:   2+ and symmetric all extremities  Skin:   Skin color, texture, turgor normal, no rashes or lesions  Lymph nodes:   Cervical, supraclavicular, and axillary nodes normal  Neurologic:   CNII-XII intact. Normal strength, sensation and reflexes      throughout    Labs on Admission:   Basic Metabolic Panel: No results found for this basename: NA, K, CL, CO2, GLUCOSE, BUN, CREATININE, CALCIUM, MG, PHOS,  in the last 168 hours Liver Function Tests: No results found for this basename: AST, ALT, ALKPHOS, BILITOT, PROT, ALBUMIN,  in the last 168 hours CBC:  Recent Labs Lab 11/08/12 0940  WBC 18.3*   NEUTROABS PENDING  HGB 7.8*  HCT 23.0*  MCV 93.9  PLT 523*    Assessment/Plan: Active Problems: 1. Hb SS with simple acute pain episode: Will place on individualized dosing ofd PCA. IVF and Toradol. Adjust PCA based on usage and pain control.  2. Cellulitis: Keflex 500 mg PO q 12 hours x 7 days.  3. Mild Dehydration: IVF    Time spend: 40 minutes Code Status: Full code Family Communication: None Disposition Plan: Home after treatment  Lennan Malone A., MD  Pager [phone]  If 7PM-7AM,  please contact night-coverage [email or web address] Password [email or web address] 11/08/2012, 10:28 AM

## 2012-11-08 NOTE — Discharge Summary (Signed)
[facility] Discharge Summary   Patient ID: [name] MRN: [id number] DOB/AGE: 1980/05/24 32 y.o.  Admit date: 11/08/2012 Discharge date: 11/08/2012  Primary Care Physician:  [name],[name], MD  Admission Diagnoses:  Active Problems:   Vaso occlusive crisis     Acute on chronic pain     Hx of Pulmonary emboli   Discharge Diagnoses:    Vaso occlusive crisis with active hemolysis     Acute on chronic pain     Leucocytosis     Hx of Pulmonary emboli    Discharge Medications:    Medication List    ASK your doctor about these medications       folic acid 1 MG tablet  Commonly known as:  FOLVITE  Take 1 tablet (1 mg total) by mouth every morning.     HYDROmorphone 4 MG tablet  Commonly known as:  DILAUDID  Take 1 tablet (4 mg total) by mouth every 4 (four) hours as needed for pain.     hydroxyurea 500 MG capsule  Commonly known as:  HYDREA  Take 2 capsules (1,000 mg total) by mouth daily. May take with food to minimize GI side effects.     ibuprofen 800 MG tablet  Commonly known as:  ADVIL,MOTRIN  Take 1 tablet (800 mg total) by mouth every 8 (eight) hours as needed for pain.     morphine 15 MG 12 hr tablet  Commonly known as:  MS CONTIN  Take 1 tablet (15 mg total) by mouth 2 (two) times daily.     potassium chloride SA 20 MEQ tablet  Commonly known as:  K-DUR,KLOR-CON  Take 1 tablet (20 mEq total) by mouth every morning.     XARELTO 20 MG Tabs tablet  Generic drug:  Rivaroxaban  Take 20 mg by mouth every morning.     zolpidem 10 MG tablet  Commonly known as:  AMBIEN  Take 1 tablet (10 mg total) by mouth at bedtime as needed for sleep. For sleep         Consults:  None  Significant Diagnostic Studies:  Dg Chest 2 View  10/19/2012   *RADIOLOGY REPORT*  Clinical Data: Leukocytosis, sickle cell crisis, nonsmoker, subsequent encounter.  CHEST - 2 VIEW  Comparison: 09/27/2012; 08/18/2012; chest CT - 08/27/2012  Findings: Grossly  unchanged borderline enlarged cardiac silhouette and mediastinal contours.  Stable positioning of support apparatus. There is grossly unchanged mild diffuse slightly nodular thickening of the pulmonary interstitium.  No new focal airspace opacity.  No pleural effusion or pneumothorax.  Grossly unchanged bones.  Post cholecystectomy.  IMPRESSION: Stable findings of borderline cardiomegaly and bronchitic change without acute cardiopulmonary disease.   Original Report Authenticated By: Tacey Ruiz, MD   Dg Shoulder Left  10/25/2012   CLINICAL DATA:  Left shoulder pain, history sickle cell disease  EXAM: LEFT SHOULDER - 2+ VIEW  COMPARISON:  08/17/2011  FINDINGS: AC joint alignment normal.  Sclerosis identified within the left humeral head compatible with avascular necrosis.  No left humeral head fracture or collapse identified.  Glenohumeral joint alignment normal.  No acute fracture, dislocation or bone destruction.  Adjacent left ribs appear intact.  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  No new osseous abnormalities.   Electronically Signed   By: Ulyses Southward M.D.   On: 10/25/2012 13:04     [facility] Course:  For complete details please refer to admission H and  P, but in brief, [name] is a 32 year old African American male with SS genotype SCD. He called the [facility] with c/o bilateral consistent pain which is his typical site for sickle cell crisis. He was admitted for management of symptoms. Treatment included:  Vaso occlusive crisis with hemolysis: IVF for hydration and aggressely treated with customized dosing for  PCA hydromorphone. Loading dose 1.5mg  Bolus 0.8mg  Q 8 mins with a lock out of total of 20mg  in 4hrs. Demanded 25 and received 25 deliveries a total usage of 20.3.   Leucocytosis: probable from infection process of cellulitis and inflammatory reactive process. Abt's prescribed and Toradol for adjunct  therapy. Cellulitis: cont Keflex 500mg  bid   Physical Exam at Discharge:  BP 115/71  Pulse 67  Temp(Src) 99 F (37.2 C) (Oral)  Resp 17  SpO2 99%  Gen: alert and oriented mild distress  Cardiovascular: regular rate/rhythum no murmurs or rubs appreciated  Respiratory: Effort normal anterior/posterior CTA Gastrointestinal:Soft non tender BS present x's 4 Extremities:Full ROM   Disposition at Discharge: 01-Home or Self Care  Discharge Orders:      Future Appointments Provider Department Dept Phone   11/26/2012 1:00 PM Grayce Sessions, NP [place] [facility] [phone]      Condition at Discharge:   Stable  Time spent on Discharge:  Greater than 30 minutes.  SignedRanda [name], [name] 11/08/2012, 5:57 PM

## 2012-11-09 ENCOUNTER — Telehealth (HOSPITAL_COMMUNITY): Payer: Self-pay | Admitting: Hematology

## 2012-11-09 ENCOUNTER — Encounter (HOSPITAL_COMMUNITY): Payer: Self-pay | Admitting: Hematology

## 2012-11-09 ENCOUNTER — Non-Acute Institutional Stay (HOSPITAL_COMMUNITY)
Admission: AD | Admit: 2012-11-09 | Discharge: 2012-11-09 | Disposition: A | Discharge status: Home / Self Care | Payer: Medicare Other | Attending: Internal Medicine | Admitting: Internal Medicine

## 2012-11-09 DIAGNOSIS — D5700 Hb-SS disease with crisis, unspecified: Secondary | ICD-10-CM | POA: Insufficient documentation

## 2012-11-09 DIAGNOSIS — G8929 Other chronic pain: Secondary | ICD-10-CM | POA: Insufficient documentation

## 2012-11-09 DIAGNOSIS — Z79899 Other long term (current) drug therapy: Secondary | ICD-10-CM | POA: Diagnosis not present

## 2012-11-09 DIAGNOSIS — R52 Pain, unspecified: Secondary | ICD-10-CM | POA: Diagnosis not present

## 2012-11-09 DIAGNOSIS — Z7901 Long term (current) use of anticoagulants: Secondary | ICD-10-CM | POA: Insufficient documentation

## 2012-11-09 DIAGNOSIS — L0291 Cutaneous abscess, unspecified: Secondary | ICD-10-CM | POA: Insufficient documentation

## 2012-11-09 DIAGNOSIS — Z86711 Personal history of pulmonary embolism: Secondary | ICD-10-CM | POA: Diagnosis not present

## 2012-11-09 MED ORDER — HYDROMORPHONE HCL PF 2 MG/ML IJ SOLN
2.0000 mg | INTRAMUSCULAR | Status: DC | PRN
  Administered 2012-11-09: 2 mg via INTRAVENOUS
  Filled 2012-11-09: qty 1

## 2012-11-09 MED ORDER — RIVAROXABAN 20 MG PO TABS
20.0000 mg | ORAL_TABLET | Freq: Every morning | ORAL | Status: DC
  Administered 2012-11-09: 20 mg via ORAL
  Filled 2012-11-09 (×2): qty 1

## 2012-11-09 MED ORDER — ONDANSETRON HCL 4 MG/2ML IJ SOLN: 4.0000 mg | Freq: Four times a day (QID) | INTRAMUSCULAR | Status: DC | PRN

## 2012-11-09 MED ORDER — HYDROMORPHONE HCL PF 2 MG/ML IJ SOLN
3.0000 mg | Freq: Once | INTRAMUSCULAR | Status: AC
  Administered 2012-11-09: 3 mg via INTRAVENOUS
  Filled 2012-11-09: qty 2

## 2012-11-09 MED ORDER — CEPHALEXIN 500 MG PO CAPS
500.0000 mg | ORAL_CAPSULE | Freq: Once | ORAL | Status: AC
  Administered 2012-11-09: 500 mg via ORAL
  Filled 2012-11-09: qty 1

## 2012-11-09 MED ORDER — SODIUM CHLORIDE 0.9 % IJ SOLN: 10.0000 mL | INTRAMUSCULAR | Status: DC | PRN

## 2012-11-09 MED ORDER — NALOXONE HCL 0.4 MG/ML IJ SOLN: 0.4000 mg | INTRAMUSCULAR | Status: DC | PRN

## 2012-11-09 MED ORDER — DIPHENHYDRAMINE HCL 25 MG PO CAPS: 25.0000 mg | ORAL_CAPSULE | Freq: Four times a day (QID) | ORAL | Status: DC | PRN

## 2012-11-09 MED ORDER — CEPHALEXIN 500 MG PO CAPS: 500.0000 mg | ORAL_CAPSULE | Freq: Two times a day (BID) | ORAL | Status: DC

## 2012-11-09 MED ORDER — SODIUM CHLORIDE 0.9 % IJ SOLN: 9.0000 mL | INTRAMUSCULAR | Status: DC | PRN

## 2012-11-09 MED ORDER — DEXTROSE-NACL 5-0.45 % IV SOLN
INTRAVENOUS | Status: DC
  Administered 2012-11-09: 11:00:00 via INTRAVENOUS

## 2012-11-09 MED ORDER — HYDROMORPHONE 0.3 MG/ML IV SOLN
INTRAVENOUS | Status: DC
  Administered 2012-11-09 (×2): via INTRAVENOUS
  Administered 2012-11-09: 7.49 mg via INTRAVENOUS
  Filled 2012-11-09 (×2): qty 25

## 2012-11-09 MED ORDER — HEPARIN SOD (PORK) LOCK FLUSH 100 UNIT/ML IV SOLN
500.0000 [IU] | INTRAVENOUS | Status: DC | PRN
  Filled 2012-11-09: qty 5

## 2012-11-09 MED ORDER — MORPHINE SULFATE ER 15 MG PO TBCR
15.0000 mg | EXTENDED_RELEASE_TABLET | Freq: Two times a day (BID) | ORAL | Status: DC
  Administered 2012-11-09: 15 mg via ORAL
  Filled 2012-11-09: qty 1

## 2012-11-09 MED ORDER — HEPARIN SOD (PORK) LOCK FLUSH 100 UNIT/ML IV SOLN
500.0000 [IU] | INTRAVENOUS | Status: AC | PRN
  Administered 2012-11-09: 500 [IU]

## 2012-11-09 MED ORDER — POTASSIUM CHLORIDE CRYS ER 20 MEQ PO TBCR
20.0000 meq | EXTENDED_RELEASE_TABLET | Freq: Every morning | ORAL | Status: DC
  Administered 2012-11-09: 20 meq via ORAL
  Filled 2012-11-09: qty 1

## 2012-11-09 MED ORDER — KETOROLAC TROMETHAMINE 30 MG/ML IJ SOLN
30.0000 mg | Freq: Four times a day (QID) | INTRAMUSCULAR | Status: DC
  Administered 2012-11-09 (×2): 30 mg via INTRAVENOUS
  Filled 2012-11-09 (×2): qty 1

## 2012-11-09 MED ORDER — SODIUM CHLORIDE 0.9 % IJ SOLN
10.0000 mL | INTRAMUSCULAR | Status: AC | PRN
  Administered 2012-11-09: 10 mL

## 2012-11-09 MED ORDER — FOLIC ACID 1 MG PO TABS
1.0000 mg | ORAL_TABLET | Freq: Every morning | ORAL | Status: DC
  Administered 2012-11-09: 1 mg via ORAL
  Filled 2012-11-09: qty 1

## 2012-11-09 NOTE — H&P (Signed)
[facility] History and Physical  [name] [name] DOB: 29-May-1980 DOA: 11/09/2012   PCP: Rielly Corlett A., MD   Chief Complaint: Pain in b/l ribs since last night  HPI:Pt with Hb SS who has been doing well until last night when he has a sudden onset of pain in  ribs which is characteristic of pain pain associated with sickle cell disease. Pt has had no cough, or SOB. Pt does have a history of PE but states that he has been compliant with use of Xarelto. Pt rates pain as 9/10 and throbbing in nature. He denies associated symptoms. He states that he took his usual medications MS Contin and Dilaudid without much relief.   Review of Systems:  Constitutional: No weight loss, night sweats, Fevers, chills, fatigue.  HEENT: No headaches, dizziness, seizures, vision changes, difficulty swallowing,Tooth/dental problems,Sore throat, No sneezing, itching, ear ache, nasal congestion, post nasal drip,  Cardio-vascular: No chest pain, Orthopnea, PND, swelling in lower extremities, anasarca, dizziness, palpitations  GI: No heartburn, indigestion, abdominal pain, nausea, vomiting, diarrhea, change in bowel habits, loss of appetite  Resp: No shortness of breath with exertion or at rest. No excess mucus, no productive cough, No non-productive cough, No coughing up of blood.No change in color of mucus.No wheezing.No chest wall deformity  Skin: no rash or lesions.  GU: no dysuria, change in color of urine, no urgency or frequency. No flank pain.  Psych: No change in mood or affect. No depression or anxiety. No memory loss.    Past Medical History  Diagnosis Date  . Sickle cell anemia   . Blood transfusion   . 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 Hypokalemia   . Mood disorder   . Pulmonary embolism   . Avascular necrosis   . Leukocytosis     Chronic  . Thrombocytosis     Chronic  . Hypertension   . C. difficile colitis    Past Surgical  History  Procedure Laterality Date  . Right hip replacement      08/2006  . Cholecystectomy      01/2008  . Porta cath placement    . Porta cath removal    . Umbilical hernia repair      01/2008  . Excision of left periauricular cyst      10/2009  . Excision of right ear lobe cyst with primary closur      11/2007  . Portacath placement  01/05/2012    Procedure: INSERTION PORT-A-CATH;  Surgeon: Adolph Pollack, MD;  Location: [facility] OR;  Service: General;  Laterality: N/A;  ultrasound guiced port a cath insertion with fluoroscopy   Social History:  reports that he quit smoking about 2 years ago. His smoking use included Cigarettes. He smoked 0.00 packs per day for 13 years. He has never used smokeless tobacco. He reports that he does not drink alcohol or use illicit drugs.  Allergies  Allergen Reactions  . Morphine And Related Hives and Rash    Pt states he can take the pill form, but not IV and he is able to tolerate Dilaudid with no reactions.    Family History  Problem Relation Age of Onset  . Sickle cell anemia Mother   . Sickle cell anemia Father   . Sickle cell trait Brother     Prior to Admission medications   Medication Sig Start Date End Date Taking? Authorizing Provider  folic acid (FOLVITE) 1 MG tablet Take  1 tablet (1 mg total) by mouth every morning. 06/05/12  Yes Altha Harm, MD  HYDROmorphone (DILAUDID) 4 MG tablet Take 1 tablet (4 mg total) by mouth every 4 (four) hours as needed for pain. 10/27/12  Yes Rometta Emery, MD  hydroxyurea (HYDREA) 500 MG capsule Take 2 capsules (1,000 mg total) by mouth daily. May take with food to minimize GI side effects. 07/12/12  Yes Altha Harm, MD  ibuprofen (ADVIL,MOTRIN) 800 MG tablet Take 1 tablet (800 mg total) by mouth every 8 (eight) hours as needed for pain. 10/15/12  Yes Grayce Sessions, NP  morphine (MS CONTIN) 15 MG 12 hr tablet Take 1 tablet (15 mg total) by mouth 2 (two) times daily. 10/27/12  Yes Rometta Emery, MD  potassium chloride SA (K-DUR,KLOR-CON) 20 MEQ tablet Take 1 tablet (20 mEq total) by mouth every morning. 07/31/12  Yes Altha Harm, MD  Rivaroxaban (XARELTO) 20 MG TABS Take 20 mg by mouth every morning.    Yes Historical Provider, MD  zolpidem (AMBIEN) 10 MG tablet Take 1 tablet (10 mg total) by mouth at bedtime as needed for sleep. For sleep 09/26/12  Yes Grayce Sessions, NP   Physical Exam: Filed Vitals:   11/09/12 1004  BP: 130/82  Pulse: 62  Temp: 99.2 F (37.3 C)  TempSrc: Oral  Resp: 20  Height: 6' (1.829 m)  Weight: 173 lb (78.472 kg)  SpO2: 96%   BP 114/75  Pulse 81  Temp(Src) 98.9 F (37.2 C) (Oral)  Resp 14  Ht 6' (1.829 m)  Wt 173 lb (78.472 kg)  BMI 23.46 kg/m2  SpO2 97%  General Appearance:    Alert, cooperative, no distress, appears stated age  Head:    Normocephalic, without obvious abnormality, atraumatic  Eyes:    PERRL, conjunctiva/corneas clear, EOM's intact, fundi    benign, both eyes with mild icterus.      Neck:   Supple, symmetrical, trachea midline, no adenopathy;       thyroid:  No enlargement/tenderness/nodules; no carotid   bruit or JVD  Back:     Symmetric, no curvature, ROM normal, no CVA tenderness  Lungs:     Clear to auscultation bilaterally, respirations unlabored  Chest wall:    No tenderness or deformity  Heart:    Regular rate and rhythm, S1 and S2 normal, no murmur, rub   or gallop  Abdomen:     Soft, non-tender, bowel sounds active all four quadrants,    no masses, no organomegaly  Extremities:   Extremities normal, atraumatic, no cyanosis or edema  Pulses:   2+ and symmetric all extremities  Skin:   Skin color, texture, turgor normal, no rashes or lesions  Lymph nodes:   Cervical, supraclavicular, and axillary nodes normal  Neurologic:   CNII-XII intact. Normal strength, sensation and reflexes      throughout    Basic Metabolic Panel:  Recent Labs Lab 11/08/12 0940  NA 137  K 3.9  CL 103  CO2 24   GLUCOSE 135*  BUN 5*  CREATININE 0.58  CALCIUM 8.8   Liver Function Tests:  Recent Labs Lab 11/08/12 0940  AST 42*  ALT 28  ALKPHOS 96  BILITOT 6.4*  PROT 7.3  ALBUMIN 3.9   CBC:  Recent Labs Lab 11/08/12 0940  WBC 18.3*  NEUTROABS 12.5*  HGB 7.8*  HCT 23.0*  MCV 93.9  PLT 523*   Assessment/Plan: Active Problems: Hb SS with Crisis: Pt  presents with acute simple pain of sickle Cell Disease. Will start on PCA and Toradol. Pt will also receive superficial heat as adjunct. Re-evaluate for dose adjustment.   Time spend: 30 minutes Code Status: Full Code Family Communication: N/A Disposition Plan: Home  Jalon Blackwelder A., MD  Pager [phone]  If 7PM-7AM, please contact night-coverage [email or web address] Password [email or web address] 11/09/2012, 11:07 AM

## 2012-11-09 NOTE — Progress Notes (Signed)
Patient ID: [name], male   DOB: 27-Dec-1980, 32 y.o.   MRN: [id number] Port flushed and deaccessed per protocol.  Prescription given for keflex.  Discharge instructions given to patient.  Questions answered.  Patient ambulatory at time of discharge.

## 2012-11-09 NOTE — Discharge Summary (Signed)
[facility] Discharge Summary   Patient ID: [name] MRN: [id number] DOB/AGE: 07/21/1980 32 y.o.  Admit date: 11/09/2012 Discharge date: 11/09/2012  Primary Care Physician:  [name],Wynn Kernes A., MD  Admission Diagnoses:  Active Problems: Vaso occlusive crisis with active hemoysis Acute on chronic pain  Cellulitis  Discharge Diagnoses:   Vaso occlusive crisis with active hemoysis Acute on chronic pain  Cellulitis   Discharge Medications:    Medication List         cephALEXin 500 MG capsule  Commonly known as:  KEFLEX  Take 1 capsule (500 mg total) by mouth 2 (two) times daily.     folic acid 1 MG tablet  Commonly known as:  FOLVITE  Take 1 tablet (1 mg total) by mouth every morning.     HYDROmorphone 4 MG tablet  Commonly known as:  DILAUDID  Take 1 tablet (4 mg total) by mouth every 4 (four) hours as needed for pain.     hydroxyurea 500 MG capsule  Commonly known as:  HYDREA  Take 2 capsules (1,000 mg total) by mouth daily. May take with food to minimize GI side effects.     ibuprofen 800 MG tablet  Commonly known as:  ADVIL,MOTRIN  Take 1 tablet (800 mg total) by mouth every 8 (eight) hours as needed for pain.     morphine 15 MG 12 hr tablet  Commonly known as:  MS CONTIN  Take 1 tablet (15 mg total) by mouth 2 (two) times daily.     potassium chloride SA 20 MEQ tablet  Commonly known as:  K-DUR,KLOR-CON  Take 1 tablet (20 mEq total) by mouth every morning.     XARELTO 20 MG Tabs tablet  Generic drug:  Rivaroxaban  Take 20 mg by mouth every morning.     zolpidem 10 MG tablet  Commonly known as:  AMBIEN  Take 1 tablet (10 mg total) by mouth at bedtime as needed for sleep. For sleep         Consults:  None  Significant Diagnostic Studies:  Dg Chest 2 View  10/19/2012   *RADIOLOGY REPORT*  Clinical Data: Leukocytosis, sickle cell crisis, nonsmoker, subsequent encounter.  CHEST - 2 VIEW  Comparison: 09/27/2012; 08/18/2012;  chest CT - 08/27/2012  Findings: Grossly unchanged borderline enlarged cardiac silhouette and mediastinal contours.  Stable positioning of support apparatus. There is grossly unchanged mild diffuse slightly nodular thickening of the pulmonary interstitium.  No new focal airspace opacity.  No pleural effusion or pneumothorax.  Grossly unchanged bones.  Post cholecystectomy.  IMPRESSION: Stable findings of borderline cardiomegaly and bronchitic change without acute cardiopulmonary disease.   Original Report Authenticated By: Tacey Ruiz, MD   Dg Shoulder Left  10/25/2012   CLINICAL DATA:  Left shoulder pain, history sickle cell disease  EXAM: LEFT SHOULDER - 2+ VIEW  COMPARISON:  08/17/2011  FINDINGS: AC joint alignment normal.  Sclerosis identified within the left humeral head compatible with avascular necrosis.  No left humeral head fracture or collapse identified.  Glenohumeral joint alignment normal.  No acute fracture, dislocation or bone destruction.  Adjacent left ribs appear intact.  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  No new osseous abnormalities.   Electronically Signed   By: Ulyses Southward M.D.   On: 10/25/2012 13:04     [facility] Course:  For complete details please refer to admission H and P, but in brief, Mr. Hunnicutt is  a 32 y/o with SS Hb genotype . He called the [facility] for pain not relieved with home analgesic. His pain is located bilateral ribs and lower back. He was admitted for management of symptoms. Treatment included: Vaso occlusive crisis: IVF for hydration, custom PCA for pain management   With 3mg  bolus than 1.5 mg loading dose and .8 mg Q 10 mins with  A 15 mg lock out in 4 hrs. Total given 14.38 plus 5 mg bolus 19.38 received. Demands 18 with 17 deliveries. Pain is down 4/10. Pt refuses admission.    Acute on chronic pain: Continued long acting MS contin  Hx of Pulmonary emboli: continue xareto.   Cellulitis: right should continue Keflex 500mg  bid for 7 days.    Physical Exam at Discharge:  BP 114/75  Pulse 85  Temp(Src) 98.9 F (37.2 C) (Oral)  Resp 20  Ht 6' (1.829 m)  Wt 173 lb (78.472 kg)  BMI 23.46 kg/m2  SpO2 97%  ZOX:WRUEA, cooperative, no distress, appears stated age  Cardiovascular:Regular rate and rhythm, S1 and S2 normal, no murmur, rub or gallop  Respiratory:Clear to auscultation bilaterally, respirations unlabored  Gastrointestinal:Soft, non-tender, bowel sounds active all four quadrants,  no masses, no organomegaly  Extremities:normal, atraumatic, no cyanosis or edema  Pulses: 2+ and symmetric all extremities   Disposition at Discharge: 01-Home or Self Care  Discharge Orders:      Future Appointments Provider Department Dept Phone   11/26/2012 1:00 PM Grayce Sessions, NP [place] [facility] [phone]      Condition at Discharge:   Stable  Time spent on Discharge:  Greater than 30 minutes.  SignedGrayce Sessions 11/09/2012, 5:49 PM

## 2012-11-09 NOTE — Telephone Encounter (Signed)
Patient called in C/O pain to thighs, ribs, back that is up to a 7/10.  Patient states he has taken his oral pain medications, but pain has increased since [facility].  Denies fever, chest pain, nausea vomiting, abdominal pain, or diarrhea.  Dr. Ashley Royalty spoke with patient.

## 2012-11-09 NOTE — Progress Notes (Signed)
NP on call notified patient pain 5/10 with completion of second PCA monojet dose. Orders received and initiated. To continue to monitor.

## 2012-11-12 IMAGING — CR DG ABDOMEN ACUTE W/ 1V CHEST
3 series · 3 of 3 positions shown · non-contrast
Comparison: 12/07/2009 and earlier studies

CLINICAL DATA: Sickle cell crisis

ACUTE ABDOMEN SERIES (ABDOMEN 2 VIEW & CHEST 1 VIEW)

[w chest pa]
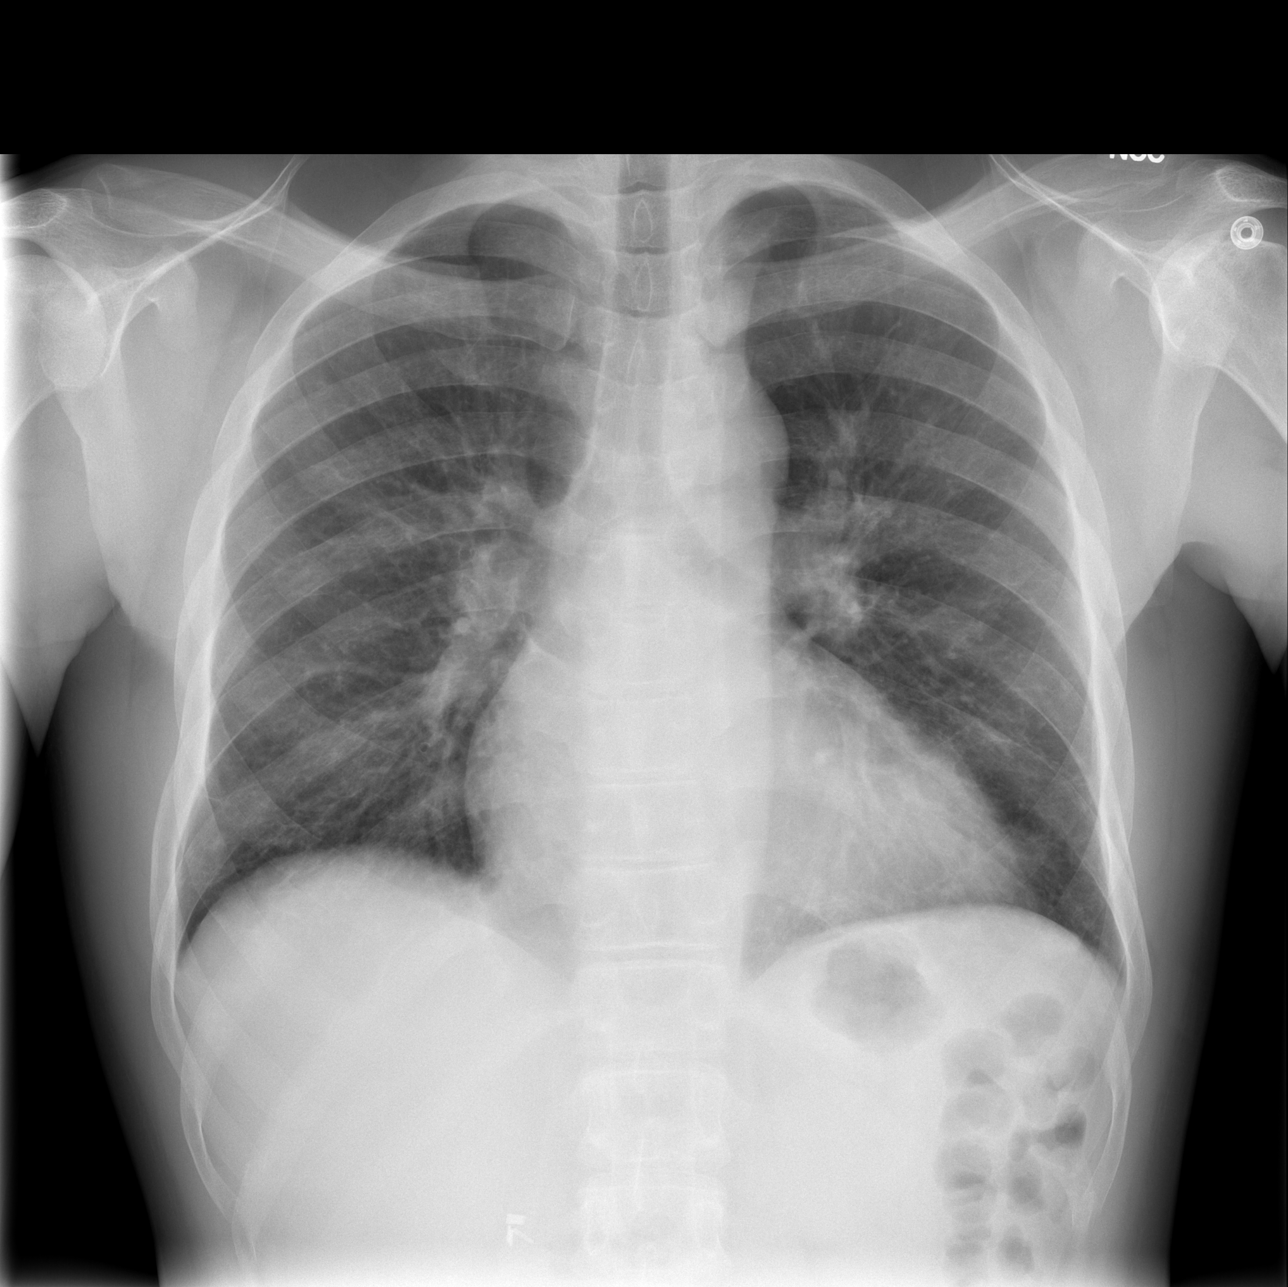

[w abdomen upright]
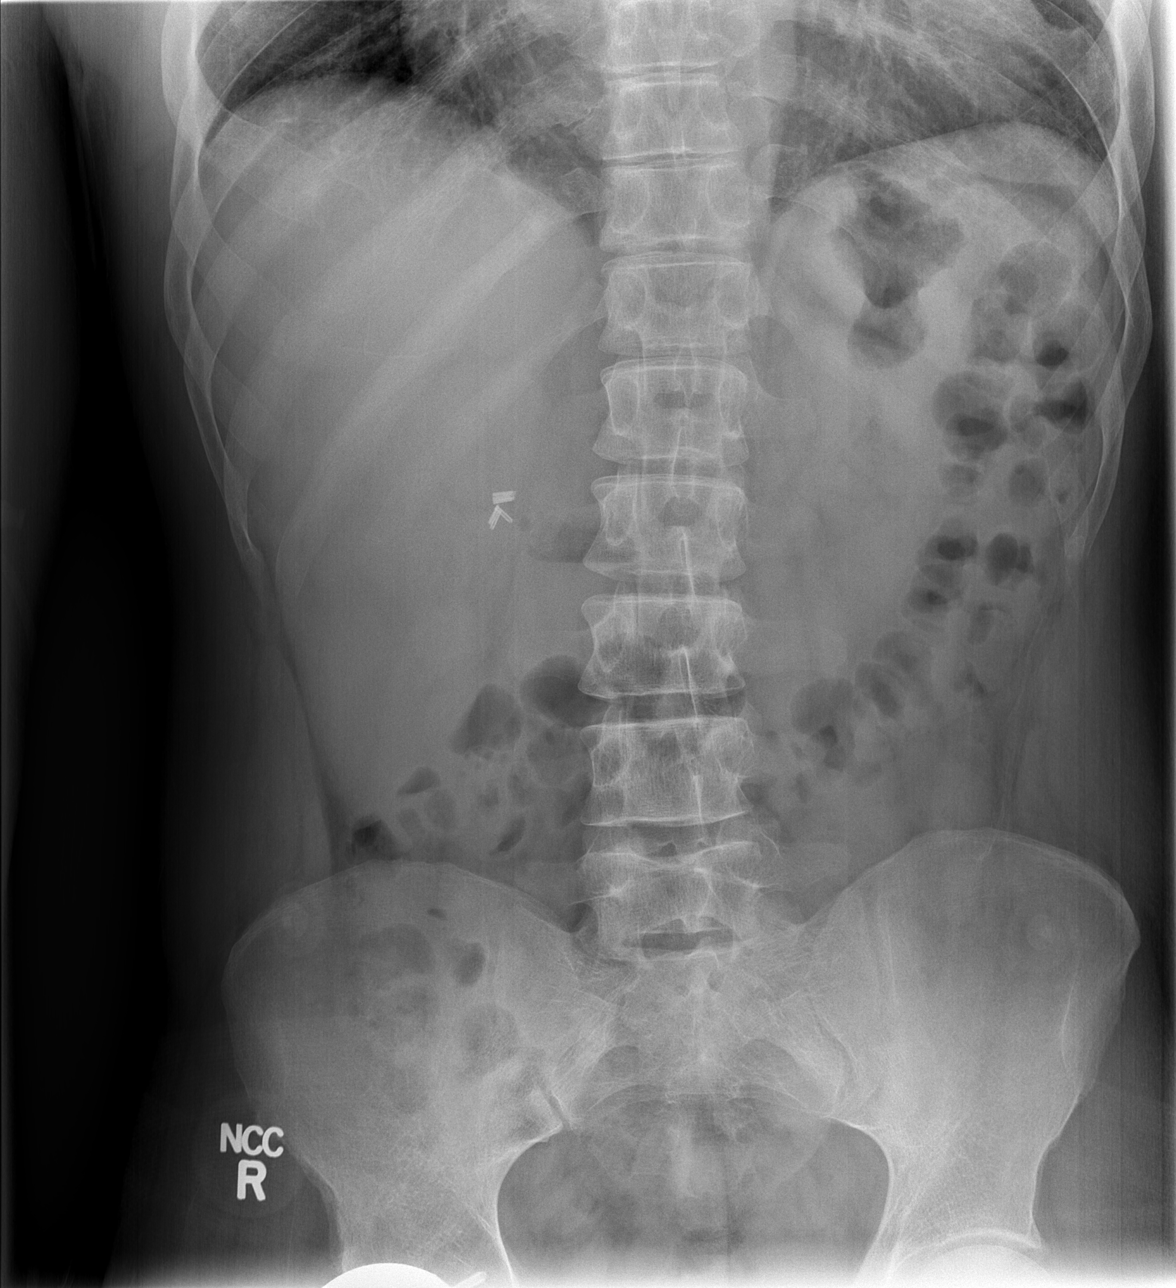

[t abdomen supine]
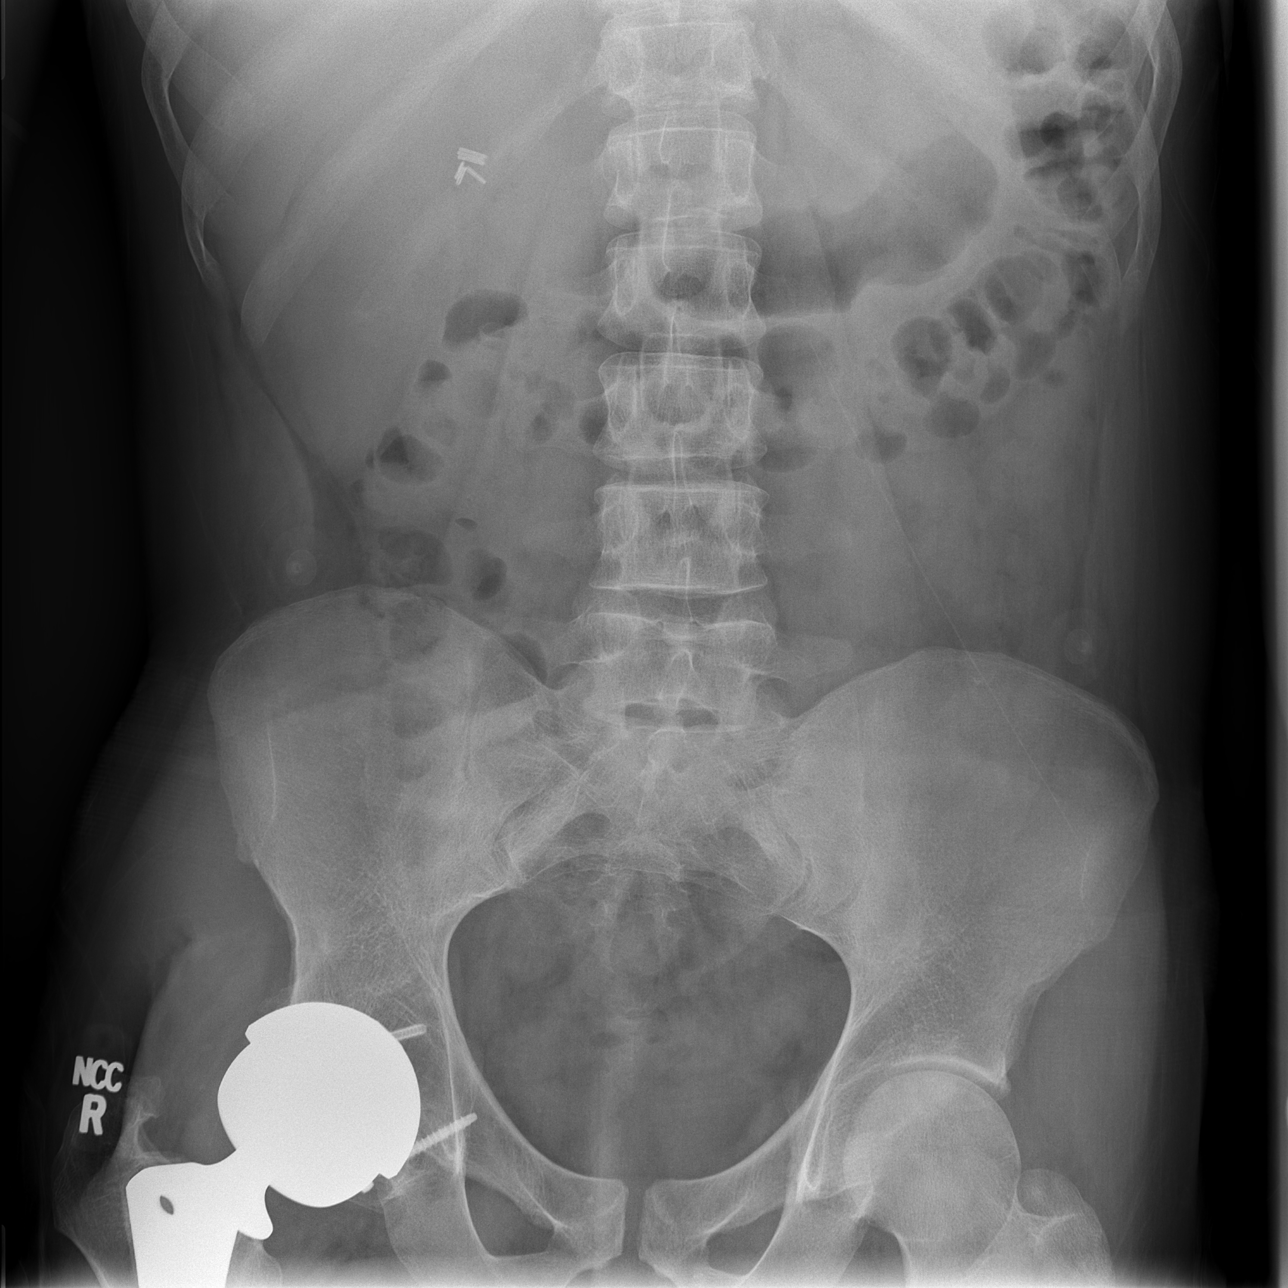

[3 of 3 positions shown; findings below may reference images not displayed]

FINDINGS: Mild perihilar interstitial prominence is stable.

No free air on the erect film.  Vascular clips in the right upper
abdomen.  Small bowel decompressed.  Normal distribution of gas and
stool throughout the colon.  Right hip arthroplasty hardware
partially seen.  No abnormal abdominal calcifications.
IMPRESSION: 1.  Normal bowel gas pattern.
2.  No free air.
3.  Chronic perihilar interstitial prominence.

## 2012-11-13 ENCOUNTER — Emergency Department (HOSPITAL_COMMUNITY): Payer: Medicare Other

## 2012-11-13 ENCOUNTER — Telehealth (HOSPITAL_COMMUNITY): Payer: Self-pay | Admitting: *Deleted

## 2012-11-13 ENCOUNTER — Inpatient Hospital Stay (HOSPITAL_COMMUNITY)
Admission: EM | Admit: 2012-11-13 | Discharge: 2012-11-19 | DRG: 811 | Disposition: A | Discharge status: Home / Self Care | Payer: Medicare Other | Attending: Internal Medicine | Admitting: Internal Medicine

## 2012-11-13 ENCOUNTER — Encounter (HOSPITAL_COMMUNITY): Payer: Self-pay | Admitting: Emergency Medicine

## 2012-11-13 DIAGNOSIS — F1120 Opioid dependence, uncomplicated: Secondary | ICD-10-CM | POA: Diagnosis present

## 2012-11-13 DIAGNOSIS — J189 Pneumonia, unspecified organism: Secondary | ICD-10-CM | POA: Diagnosis present

## 2012-11-13 DIAGNOSIS — I2782 Chronic pulmonary embolism: Secondary | ICD-10-CM | POA: Diagnosis not present

## 2012-11-13 DIAGNOSIS — Z86711 Personal history of pulmonary embolism: Secondary | ICD-10-CM

## 2012-11-13 DIAGNOSIS — R0902 Hypoxemia: Secondary | ICD-10-CM | POA: Diagnosis not present

## 2012-11-13 DIAGNOSIS — Z23 Encounter for immunization: Secondary | ICD-10-CM

## 2012-11-13 DIAGNOSIS — D571 Sickle-cell disease without crisis: Secondary | ICD-10-CM | POA: Diagnosis present

## 2012-11-13 DIAGNOSIS — D5701 Hb-SS disease with acute chest syndrome: Secondary | ICD-10-CM | POA: Diagnosis not present

## 2012-11-13 DIAGNOSIS — Z87891 Personal history of nicotine dependence: Secondary | ICD-10-CM | POA: Diagnosis not present

## 2012-11-13 DIAGNOSIS — D473 Essential (hemorrhagic) thrombocythemia: Secondary | ICD-10-CM | POA: Diagnosis present

## 2012-11-13 DIAGNOSIS — Z96649 Presence of unspecified artificial hip joint: Secondary | ICD-10-CM

## 2012-11-13 DIAGNOSIS — D649 Anemia, unspecified: Secondary | ICD-10-CM | POA: Diagnosis not present

## 2012-11-13 DIAGNOSIS — D5700 Hb-SS disease with crisis, unspecified: Secondary | ICD-10-CM | POA: Diagnosis not present

## 2012-11-13 DIAGNOSIS — Z79899 Other long term (current) drug therapy: Secondary | ICD-10-CM | POA: Diagnosis not present

## 2012-11-13 DIAGNOSIS — Z7901 Long term (current) use of anticoagulants: Secondary | ICD-10-CM | POA: Diagnosis not present

## 2012-11-13 DIAGNOSIS — I10 Essential (primary) hypertension: Secondary | ICD-10-CM | POA: Diagnosis not present

## 2012-11-13 DIAGNOSIS — F39 Unspecified mood [affective] disorder: Secondary | ICD-10-CM

## 2012-11-13 DIAGNOSIS — I2789 Other specified pulmonary heart diseases: Secondary | ICD-10-CM | POA: Diagnosis not present

## 2012-11-13 DIAGNOSIS — R079 Chest pain, unspecified: Secondary | ICD-10-CM | POA: Diagnosis not present

## 2012-11-13 DIAGNOSIS — D72829 Elevated white blood cell count, unspecified: Secondary | ICD-10-CM | POA: Diagnosis present

## 2012-11-13 DIAGNOSIS — J9819 Other pulmonary collapse: Secondary | ICD-10-CM | POA: Diagnosis not present

## 2012-11-13 DIAGNOSIS — J90 Pleural effusion, not elsewhere classified: Secondary | ICD-10-CM | POA: Diagnosis not present

## 2012-11-13 LAB — CBC WITH DIFFERENTIAL/PLATELET
Basophils Absolute: 0 10*3/uL (ref 0.0–0.1)
Eosinophils Absolute: 0.2 10*3/uL (ref 0.0–0.7)
HCT: 21.1 % — ABNORMAL LOW (ref 39.0–52.0)
Lymphocytes Relative: 11 % — ABNORMAL LOW (ref 12–46)
Lymphs Abs: 2.4 10*3/uL (ref 0.7–4.0)
MCH: 32.3 pg (ref 26.0–34.0)
MCHC: 35.1 g/dL (ref 30.0–36.0)
MCV: 92.1 fL (ref 78.0–100.0)
Monocytes Absolute: 4.1 10*3/uL — ABNORMAL HIGH (ref 0.1–1.0)
Monocytes Relative: 19 % — ABNORMAL HIGH (ref 3–12)
Neutro Abs: 14.7 10*3/uL — ABNORMAL HIGH (ref 1.7–7.7)
Platelets: 559 10*3/uL — ABNORMAL HIGH (ref 150–400)
RBC: 2.29 MIL/uL — ABNORMAL LOW (ref 4.22–5.81)
RDW: 17.7 % — ABNORMAL HIGH (ref 11.5–15.5)

## 2012-11-13 LAB — BASIC METABOLIC PANEL
BUN: 7 mg/dL (ref 6–23)
CO2: 24 mEq/L (ref 19–32)
Calcium: 8.4 mg/dL (ref 8.4–10.5)
Creatinine, Ser: 0.53 mg/dL (ref 0.50–1.35)
GFR calc non Af Amer: >90 mL/min (ref >90)
Glucose, Bld: 124 mg/dL — ABNORMAL HIGH (ref 70–99)
Potassium: 3.6 mEq/L (ref 3.5–5.1)
Sodium: 131 mEq/L — ABNORMAL LOW (ref 135–145)

## 2012-11-13 LAB — RETICULOCYTES
Retic Count, Absolute: 224.1 10*3/uL — ABNORMAL HIGH (ref 19.0–186.0)
Retic Ct Pct: 9.7 % — ABNORMAL HIGH (ref 0.4–3.1)

## 2012-11-13 LAB — URINALYSIS, ROUTINE W REFLEX MICROSCOPIC
Bilirubin Urine: NEGATIVE
Glucose, UA: NEGATIVE mg/dL
Hgb urine dipstick: NEGATIVE
Ketones, ur: NEGATIVE mg/dL
Leukocytes, UA: NEGATIVE
Leukocytes, UA: NEGATIVE
Nitrite: NEGATIVE
Protein, ur: NEGATIVE mg/dL
Specific Gravity, Urine: 1.015 (ref 1.005–1.030)
Urobilinogen, UA: 1 mg/dL (ref 0.0–1.0)
pH: 7 (ref 5.0–8.0)

## 2012-11-13 LAB — PRO B NATRIURETIC PEPTIDE: Pro B Natriuretic peptide (BNP): 106.3 pg/mL (ref 0–125)

## 2012-11-13 LAB — MAGNESIUM: Magnesium: 1.7 mg/dL (ref 1.5–2.5)

## 2012-11-13 IMAGING — CT CT ANGIO CHEST
1 of 8 series · 18 of 36 positions shown · IV contrast (APPLIED)
Comparison: 12/04/2009

CLINICAL DATA: .  Bilateral lower rib pain.  History of PE 5 years
prior.

CT ANGIOGRAPHY CHEST WITH CONTRAST
TECHNIQUE: Multidetector CT imaging of the chest was performed
using the standard protocol during bolus administration of
intravenous contrast.  Multiplanar CT image reconstructions
including MIPs were obtained to evaluate the vascular anatomy.
Contrast:  100 ml of Emnipaque-3PP

[Series 14: pulm embolism 1.0 b25f thins · axial · 0.70mm/px · z∈[-244,-22]mm · 18 of 249 slices shown]
[im 14/249  lung]
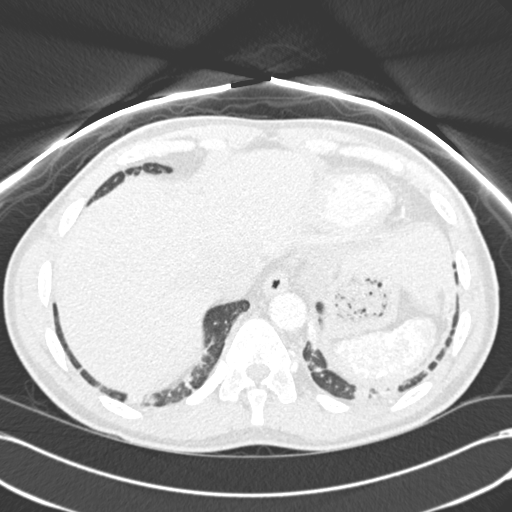
[im 27/249  mediastinal]
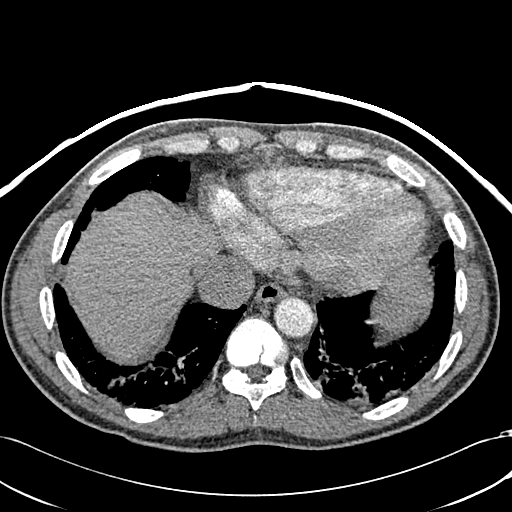
[im 40/249  lung]
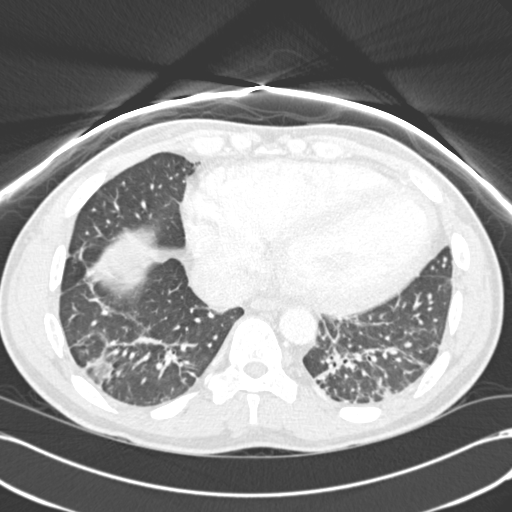
[im 53/249  mediastinal]
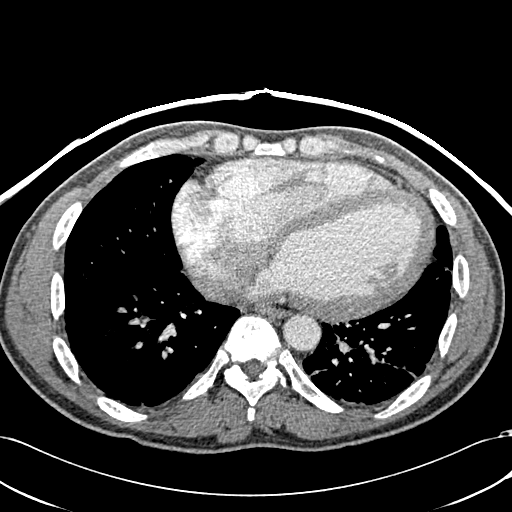
[im 66/249  lung]
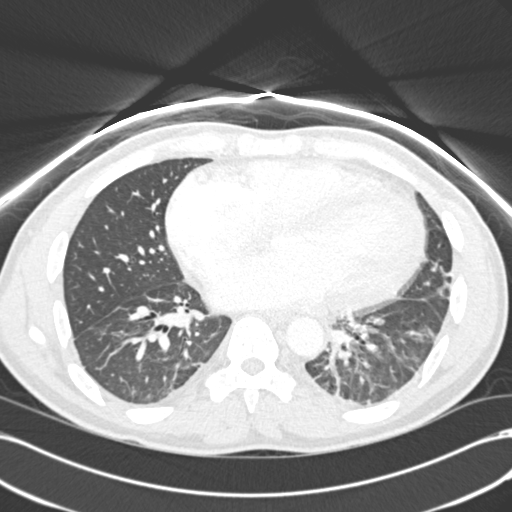
[im 79/249  mediastinal]
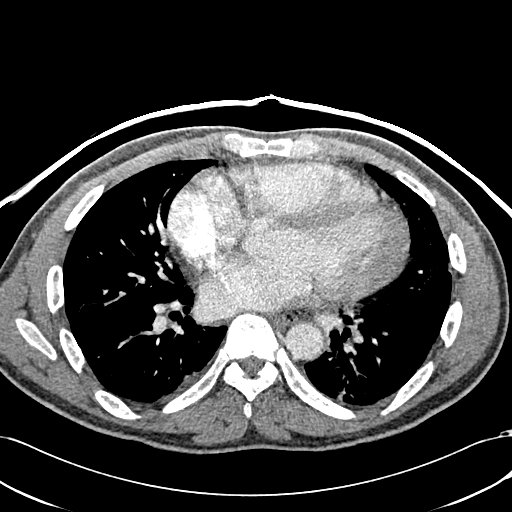
[im 92/249  lung]
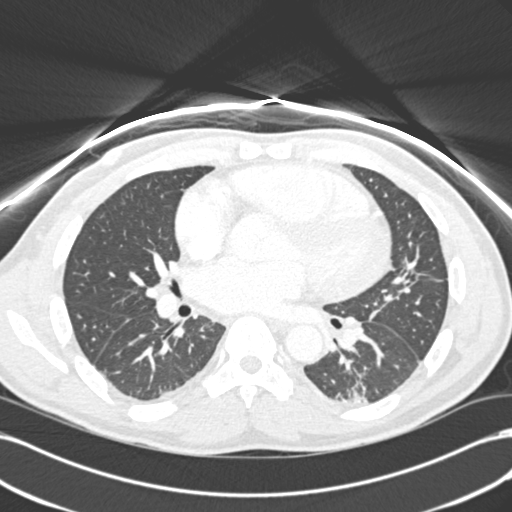
[im 105/249  mediastinal]
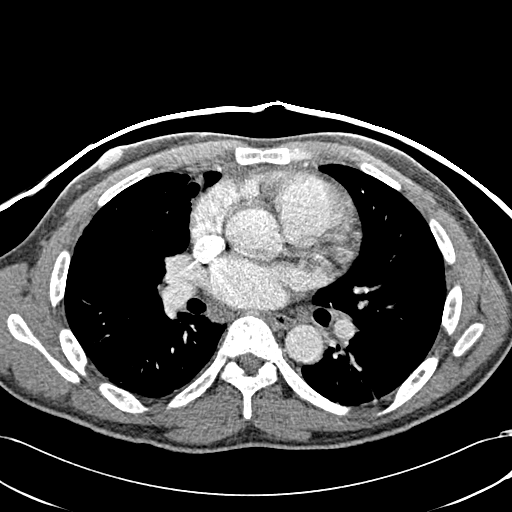
[im 118/249  lung]
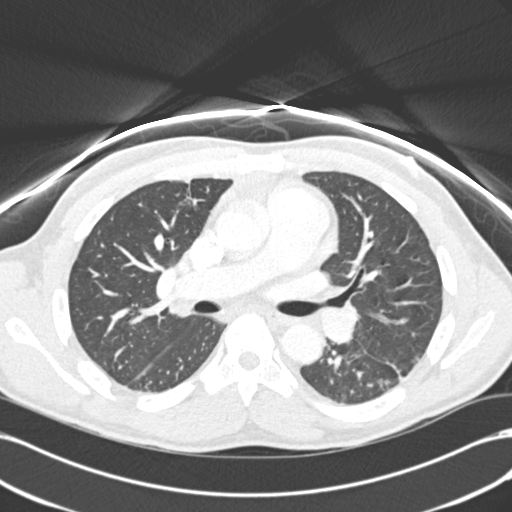
[im 131/249  mediastinal]
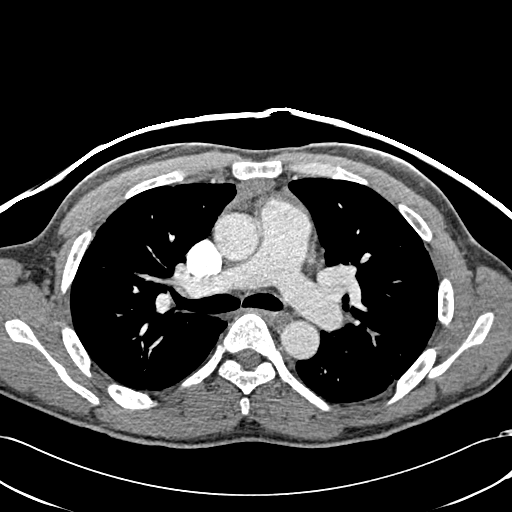
[im 144/249  lung]
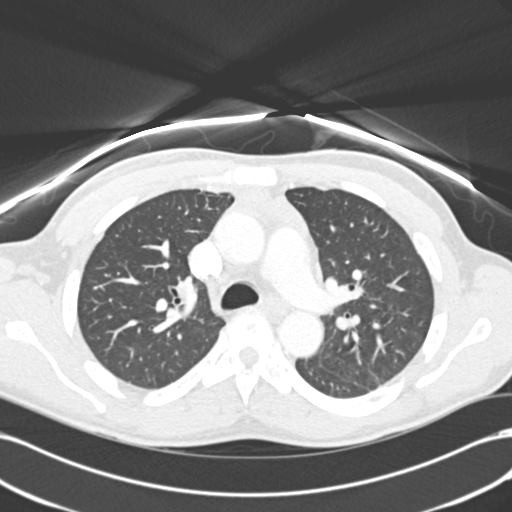
[im 157/249  mediastinal]
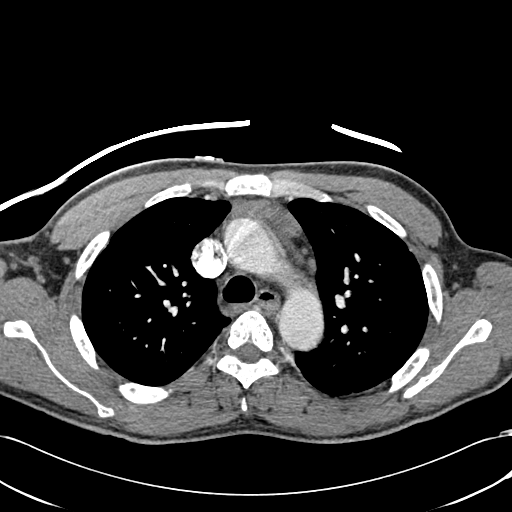
[im 170/249  lung]
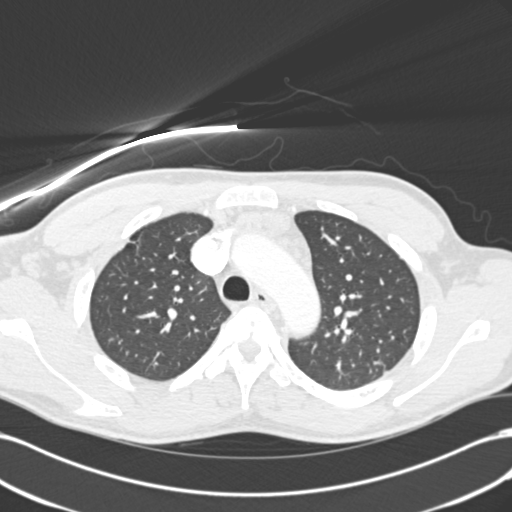
[im 183/249  mediastinal]
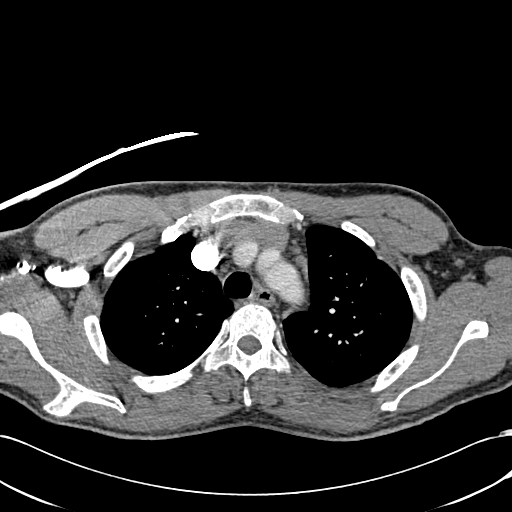
[im 196/249  lung]
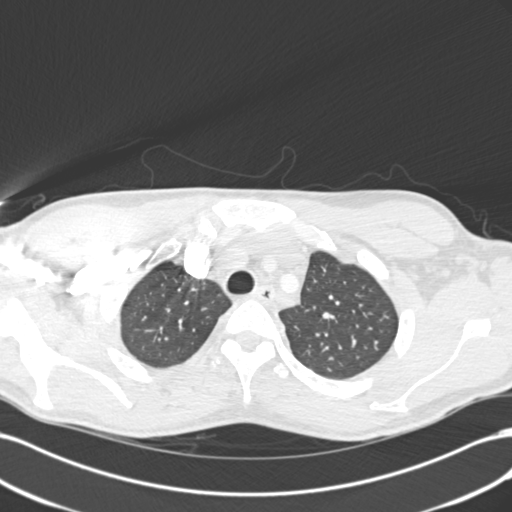
[im 209/249  mediastinal]
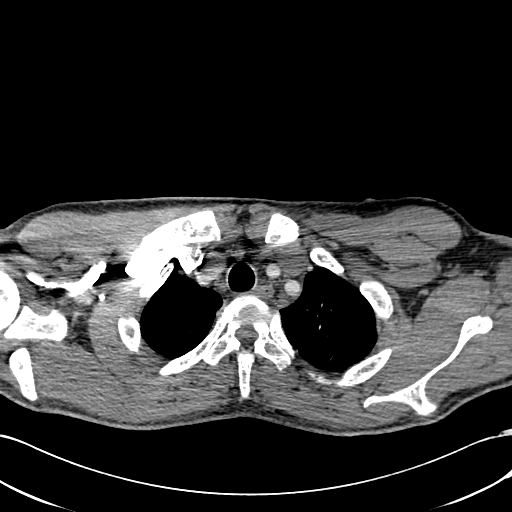
[im 222/249  lung]
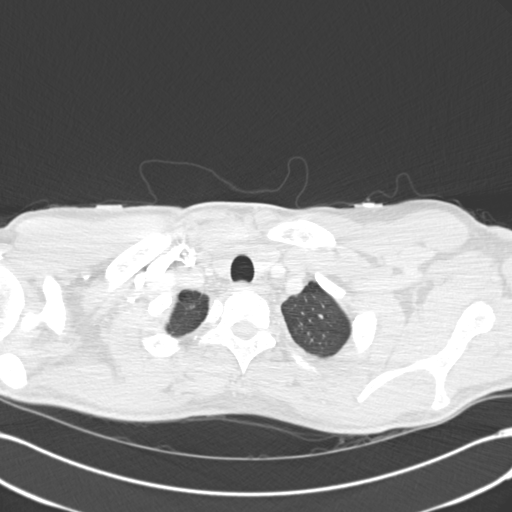
[im 235/249  mediastinal]
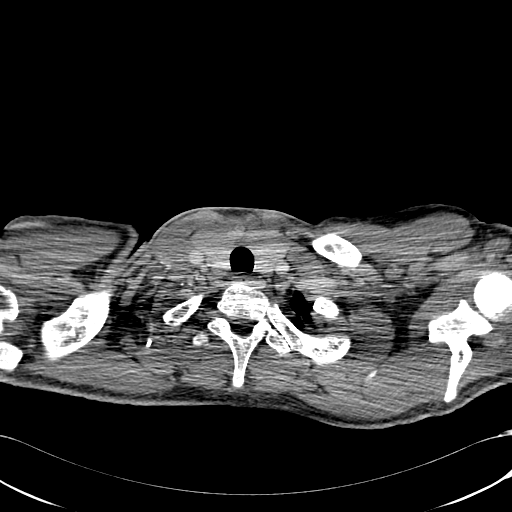

[18 of 36 positions shown; findings below may reference images not displayed]

FINDINGS: There is suboptimal opacification of the subsegmental
pulmonary arteries.  However, with this limitation, there is no
filling defects seen in the central or segmental pulmonary
arteries.  The pulmonary  artery is at the upper limits of normal
in size measuring 3 cm.  The heart is enlarged but unchanged.  The
aorta and major branch vessels are patent.  Mediastinal adenopathy
is noted, unchanged compared to most recent exam with the largest
nodes measuring 1.8 cm in the prevascular region, and 13 mm in the
right paratracheal region.  No axillary adenopathy is present.  The
thyroid is normal.  The previously described peribronchial
opacities in the right upper lobe have significantly improved.
Patchy areas of atelectasis are again seen at the lung bases,
unchanged compared to most recent exam although, increased compared
the examinations [DATE] and [place] of this year.  There are no
pleural effusions.

Calcifications are seen throughout the spleen compatible which is
small and likely represents a sequela of multiple infarctions.  The
upper abdomen is otherwise normal.  Biconcave vertebral bodies are
noted compatible with the given history of sickle cell disease.

Review of the MIP images confirms the above findings.
IMPRESSION: 1.  No evidence of pulmonary embolism on today's exam in the
central or subsegmental pulmonary arteries.
2.  Interval resolution of previously described peribronchial
opacities in the right upper lobe with persistent linear opacities
at the lung bases, favoring atelectasis.
3.  Sequela of sickle cell disease with cardiomegaly, auto
infarction of the spleen and biconcave vertebral bodies.
4.  Unchanged mediastinal adenopathy.

## 2012-11-13 MED ORDER — SODIUM CHLORIDE 0.9 % IJ SOLN: 9.0000 mL | INTRAMUSCULAR | Status: DC | PRN

## 2012-11-13 MED ORDER — FOLIC ACID 1 MG PO TABS
1.0000 mg | ORAL_TABLET | Freq: Every day | ORAL | Status: DC
  Administered 2012-11-13 – 2012-11-19 (×7): 1 mg via ORAL
  Filled 2012-11-13 (×7): qty 1

## 2012-11-13 MED ORDER — DEXTROSE-NACL 5-0.45 % IV SOLN
INTRAVENOUS | Status: DC
  Administered 2012-11-13 – 2012-11-15 (×3): 1000 mL via INTRAVENOUS
  Administered 2012-11-15 – 2012-11-16 (×2): via INTRAVENOUS
  Administered 2012-11-16 (×2): 1000 mL via INTRAVENOUS
  Administered 2012-11-17: 02:00:00 via INTRAVENOUS

## 2012-11-13 MED ORDER — DM-GUAIFENESIN ER 30-600 MG PO TB12
1.0000 | ORAL_TABLET | Freq: Two times a day (BID) | ORAL | Status: DC
  Administered 2012-11-13 – 2012-11-19 (×12): 1 via ORAL
  Filled 2012-11-13 (×13): qty 1

## 2012-11-13 MED ORDER — KETOROLAC TROMETHAMINE 30 MG/ML IJ SOLN
30.0000 mg | Freq: Four times a day (QID) | INTRAMUSCULAR | Status: AC
  Administered 2012-11-13 – 2012-11-18 (×20): 30 mg via INTRAVENOUS
  Filled 2012-11-13 (×29): qty 1

## 2012-11-13 MED ORDER — DEXTROSE 5 % IV SOLN
2.0000 g | Freq: Two times a day (BID) | INTRAVENOUS | Status: DC
  Administered 2012-11-13 – 2012-11-14 (×2): 2 g via INTRAVENOUS
  Filled 2012-11-13 (×3): qty 2

## 2012-11-13 MED ORDER — IOHEXOL 350 MG/ML SOLN
100.0000 mL | Freq: Once | INTRAVENOUS | Status: AC | PRN
  Administered 2012-11-13: 100 mL via INTRAVENOUS

## 2012-11-13 MED ORDER — NALOXONE HCL 0.4 MG/ML IJ SOLN: 0.4000 mg | INTRAMUSCULAR | Status: DC | PRN

## 2012-11-13 MED ORDER — SODIUM CHLORIDE 0.9 % IV SOLN: INTRAVENOUS | Status: DC

## 2012-11-13 MED ORDER — ZOLPIDEM TARTRATE 10 MG PO TABS: 10.0000 mg | ORAL_TABLET | Freq: Every evening | ORAL | Status: DC | PRN

## 2012-11-13 MED ORDER — FOLIC ACID 1 MG PO TABS
1.0000 mg | ORAL_TABLET | Freq: Every day | ORAL | Status: DC
  Filled 2012-11-13: qty 1

## 2012-11-13 MED ORDER — SODIUM CHLORIDE 0.9 % IV BOLUS (SEPSIS)
2000.0000 mL | Freq: Once | INTRAVENOUS | Status: DC
  Administered 2012-11-13: 2000 mL via INTRAVENOUS

## 2012-11-13 MED ORDER — ONDANSETRON HCL 4 MG/2ML IJ SOLN: 4.0000 mg | Freq: Four times a day (QID) | INTRAMUSCULAR | Status: DC | PRN

## 2012-11-13 MED ORDER — RIVAROXABAN 20 MG PO TABS
20.0000 mg | ORAL_TABLET | Freq: Every day | ORAL | Status: DC
  Administered 2012-11-14 – 2012-11-18 (×5): 20 mg via ORAL
  Filled 2012-11-13 (×7): qty 1

## 2012-11-13 MED ORDER — VANCOMYCIN HCL IN DEXTROSE 1-5 GM/200ML-% IV SOLN
1000.0000 mg | Freq: Three times a day (TID) | INTRAVENOUS | Status: DC
  Administered 2012-11-13 – 2012-11-14 (×4): 1000 mg via INTRAVENOUS
  Filled 2012-11-13 (×4): qty 200

## 2012-11-13 MED ORDER — HYDROMORPHONE HCL PF 1 MG/ML IJ SOLN
1.0000 mg | Freq: Once | INTRAMUSCULAR | Status: AC
  Administered 2012-11-13: 1 mg via INTRAVENOUS
  Filled 2012-11-13: qty 1

## 2012-11-13 MED ORDER — HYDROXYUREA 500 MG PO CAPS
1000.0000 mg | ORAL_CAPSULE | Freq: Every day | ORAL | Status: DC
  Administered 2012-11-13 – 2012-11-19 (×7): 1000 mg via ORAL
  Filled 2012-11-13 (×7): qty 2

## 2012-11-13 MED ORDER — BISACODYL 5 MG PO TBEC: 5.0000 mg | DELAYED_RELEASE_TABLET | Freq: Every day | ORAL | Status: DC | PRN

## 2012-11-13 MED ORDER — SENNOSIDES-DOCUSATE SODIUM 8.6-50 MG PO TABS
1.0000 | ORAL_TABLET | Freq: Two times a day (BID) | ORAL | Status: DC
  Administered 2012-11-13 – 2012-11-18 (×9): 1 via ORAL
  Filled 2012-11-13 (×13): qty 1

## 2012-11-13 MED ORDER — IPRATROPIUM BROMIDE 0.02 % IN SOLN: 0.5000 mg | RESPIRATORY_TRACT | Status: DC | PRN

## 2012-11-13 MED ORDER — DIPHENHYDRAMINE HCL 25 MG PO CAPS
25.0000 mg | ORAL_CAPSULE | Freq: Four times a day (QID) | ORAL | Status: DC | PRN
  Administered 2012-11-15: 25 mg via ORAL
  Filled 2012-11-13: qty 1

## 2012-11-13 MED ORDER — ENOXAPARIN SODIUM 40 MG/0.4ML ~~LOC~~ SOLN: 40.0000 mg | SUBCUTANEOUS | Status: DC

## 2012-11-13 MED ORDER — INFLUENZA VAC SPLIT QUAD 0.5 ML IM SUSP
0.5000 mL | INTRAMUSCULAR | Status: AC
  Administered 2012-11-14: 0.5 mL via INTRAMUSCULAR
  Filled 2012-11-13 (×2): qty 0.5

## 2012-11-13 MED ORDER — ONDANSETRON HCL 4 MG/2ML IJ SOLN
4.0000 mg | Freq: Once | INTRAMUSCULAR | Status: AC
  Administered 2012-11-13: 4 mg via INTRAVENOUS
  Filled 2012-11-13: qty 2

## 2012-11-13 MED ORDER — HYDROMORPHONE 0.3 MG/ML IV SOLN
INTRAVENOUS | Status: DC
  Administered 2012-11-13 – 2012-11-14 (×4): via INTRAVENOUS
  Administered 2012-11-14: 4.14 mg via INTRAVENOUS
  Administered 2012-11-14: 19:00:00 via INTRAVENOUS
  Administered 2012-11-14: 12.13 mg via INTRAVENOUS
  Administered 2012-11-14: 7.38 mg via INTRAVENOUS
  Administered 2012-11-14: 05:00:00 via INTRAVENOUS
  Administered 2012-11-14: 13.93 mg via INTRAVENOUS
  Administered 2012-11-14: via INTRAVENOUS
  Administered 2012-11-14: 6.1 mg via INTRAVENOUS
  Administered 2012-11-14 (×2): 10.28 mg via INTRAVENOUS
  Administered 2012-11-14: 01:00:00 via INTRAVENOUS
  Administered 2012-11-15: 7.4 mg via INTRAVENOUS
  Administered 2012-11-15: 22:00:00 via INTRAVENOUS
  Administered 2012-11-15: 4.79 mg via INTRAVENOUS
  Administered 2012-11-15 (×2): 7.3 mg via INTRAVENOUS
  Administered 2012-11-15 – 2012-11-16 (×2): via INTRAVENOUS
  Administered 2012-11-16: 11.79 mg via INTRAVENOUS
  Administered 2012-11-16: 11.73 mg via INTRAVENOUS
  Administered 2012-11-16: 07:00:00 via INTRAVENOUS
  Administered 2012-11-16: 8.27 mg via INTRAVENOUS
  Administered 2012-11-16: 03:00:00 via INTRAVENOUS
  Administered 2012-11-16 (×2): 25 mL via INTRAVENOUS
  Administered 2012-11-16: 20:00:00 via INTRAVENOUS
  Administered 2012-11-17: 25 mL via INTRAVENOUS
  Administered 2012-11-17: 10.39 mL via INTRAVENOUS
  Administered 2012-11-17 (×3): via INTRAVENOUS
  Administered 2012-11-17: 7.52 mg via INTRAVENOUS
  Administered 2012-11-17: 23:00:00 via INTRAVENOUS
  Administered 2012-11-17: 2.39 mg via INTRAVENOUS
  Administered 2012-11-18: 02:00:00 via INTRAVENOUS
  Administered 2012-11-18: 12:00:00 7.47 mg via INTRAVENOUS
  Administered 2012-11-18: 09:00:00 via INTRAVENOUS
  Administered 2012-11-18: 19:00:00 7.44 mg via INTRAVENOUS
  Administered 2012-11-18: 10.8 mg via INTRAVENOUS
  Administered 2012-11-18: 1.6 mg via INTRAVENOUS
  Administered 2012-11-18: 9.7 mg via INTRAVENOUS
  Administered 2012-11-18: 23:00:00 via INTRAVENOUS
  Administered 2012-11-18: 8.3 mg via INTRAVENOUS
  Administered 2012-11-18: 16:00:00 7.19 mg via INTRAVENOUS
  Administered 2012-11-18 – 2012-11-19 (×2): via INTRAVENOUS
  Administered 2012-11-19: 8.26 mg via INTRAVENOUS
  Administered 2012-11-19: 7.52 mg via INTRAVENOUS
  Administered 2012-11-19: 4.8 mg via INTRAVENOUS
  Filled 2012-11-13 (×33): qty 25

## 2012-11-13 MED ORDER — ZOLPIDEM TARTRATE 5 MG PO TABS: 5.0000 mg | ORAL_TABLET | Freq: Every evening | ORAL | Status: DC | PRN

## 2012-11-13 MED ORDER — SENNA 8.6 MG PO TABS: 1.0000 | ORAL_TABLET | Freq: Two times a day (BID) | ORAL | Status: DC

## 2012-11-13 MED ORDER — HYDROMORPHONE HCL PF 2 MG/ML IJ SOLN
2.0000 mg | INTRAMUSCULAR | Status: DC | PRN
  Administered 2012-11-13: 2 mg via INTRAVENOUS
  Filled 2012-11-13: qty 1

## 2012-11-13 MED ORDER — ONDANSETRON HCL 4 MG/2ML IJ SOLN
4.0000 mg | Freq: Once | INTRAMUSCULAR | Status: DC
  Filled 2012-11-13: qty 2

## 2012-11-13 MED ORDER — ALBUTEROL SULFATE (5 MG/ML) 0.5% IN NEBU: 2.5000 mg | INHALATION_SOLUTION | RESPIRATORY_TRACT | Status: DC | PRN

## 2012-11-13 MED ORDER — POTASSIUM CHLORIDE CRYS ER 20 MEQ PO TBCR
20.0000 meq | EXTENDED_RELEASE_TABLET | Freq: Every morning | ORAL | Status: DC
  Administered 2012-11-14 – 2012-11-19 (×6): 20 meq via ORAL
  Filled 2012-11-13 (×6): qty 1

## 2012-11-13 MED ORDER — ONDANSETRON HCL 4 MG/2ML IJ SOLN
4.0000 mg | Freq: Once | INTRAMUSCULAR | Status: AC
  Administered 2012-11-13: 4 mg via INTRAVENOUS

## 2012-11-13 NOTE — Progress Notes (Signed)
Report taken from ED RN,
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
## 2012-11-13 NOTE — ED Provider Notes (Signed)
CSN: [id number]     Arrival date & time 11/13/12  2725 History   First MD Initiated Contact with Patient 11/13/12 [id number]     Chief Complaint  Patient presents with  . Sickle Cell Pain Crisis   (Consider location/radiation/quality/duration/timing/severity/associated sxs/prior Treatment) HPI..... level V caveat for urgent need for intervention. Patient has sickle cell anemia c severe pain in his left anterior chest. Has history of pulmonary embolism.   He is concerned about same.   No fever, chills,  rusty sputum. Pain medicines does not seem to help. Severity is moderate to severe.  Past Medical History  Diagnosis Date  . Sickle cell anemia   . Blood transfusion   . 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 Hypokalemia   . Mood disorder   . Pulmonary embolism   . Avascular necrosis   . Leukocytosis     Chronic  . Thrombocytosis     Chronic  . Hypertension   . C. difficile colitis    Past Surgical History  Procedure Laterality Date  . Right hip replacement      08/2006  . Cholecystectomy      01/2008  . Porta cath placement    . Porta cath removal    . Umbilical hernia repair      01/2008  . Excision of left periauricular cyst      10/2009  . Excision of right ear lobe cyst with primary closur      11/2007  . Portacath placement  01/05/2012    Procedure: INSERTION PORT-A-CATH;  Surgeon: Adolph Pollack, MD;  Location: [facility] OR;  Service: General;  Laterality: N/A;  ultrasound guiced port a cath insertion with fluoroscopy   Family History  Problem Relation Age of Onset  . Sickle cell anemia Mother   . Sickle cell anemia Father   . Sickle cell trait Brother    History  Substance Use Topics  . Smoking status: Former Smoker -- 13 years    Types: Cigarettes    Quit date: 07/08/2010  . Smokeless tobacco: Never Used  . Alcohol Use: No    Review of Systems  Unable to perform ROS: Acuity of condition    Allergies  Morphine and related  Home  Medications   Current Outpatient Rx  Name  Route  Sig  Dispense  Refill  . folic acid (FOLVITE) 1 MG tablet   Oral   Take 1 tablet (1 mg total) by mouth every morning.   30 tablet   11   . HYDROmorphone (DILAUDID) 4 MG tablet   Oral   Take 1 tablet (4 mg total) by mouth every 4 (four) hours as needed for pain.   90 tablet   0   . hydroxyurea (HYDREA) 500 MG capsule   Oral   Take 2 capsules (1,000 mg total) by mouth daily. May take with food to minimize GI side effects.   60 capsule   1   . ibuprofen (ADVIL,MOTRIN) 800 MG tablet   Oral   Take 1 tablet (800 mg total) by mouth every 8 (eight) hours as needed for pain.   60 tablet   0   . morphine (MS CONTIN) 15 MG 12 hr tablet   Oral   Take 1 tablet (15 mg total) by mouth 2 (two) times daily.   60 tablet   0   . potassium chloride SA (K-DUR,KLOR-CON) 20 MEQ tablet   Oral   Take 1 tablet (20  mEq total) by mouth every morning.   30 tablet   3   . Rivaroxaban (XARELTO) 20 MG TABS   Oral   Take 20 mg by mouth every morning.          . zolpidem (AMBIEN) 10 MG tablet   Oral   Take 1 tablet (10 mg total) by mouth at bedtime as needed for sleep. For sleep   30 tablet   1   . cephALEXin (KEFLEX) 500 MG capsule   Oral   Take 1 capsule (500 mg total) by mouth 2 (two) times daily.   14 capsule   0    BP 132/76  Pulse 105  Temp(Src) 99.8 F (37.7 C) (Oral)  Resp 18  Ht 6' 0.05" (1.83 m)  Wt 173 lb 1 oz (78.5 kg)  BMI 23.44 kg/m2  SpO2 96% Physical Exam  Nursing note and vitals reviewed. Constitutional: He is oriented to person, place, and time.  Patient appears to be in pain, points to left-side of chest  HENT:  Head: Normocephalic and atraumatic.  Eyes: Conjunctivae and EOM are normal. Pupils are equal, round, and reactive to light.  Neck: Normal range of motion. Neck supple.  Cardiovascular: Normal rate, regular rhythm and normal heart sounds.   Pulmonary/Chest: Effort normal and breath sounds normal.   Abdominal: Soft. Bowel sounds are normal.  Musculoskeletal: Normal range of motion.  Neurological: He is alert and oriented to person, place, and time.  Skin: Skin is warm and dry.  Psychiatric: He has a normal mood and affect.    ED Course  Procedures (including critical care time) Labs Review Labs Reviewed  CBC WITH DIFFERENTIAL - Abnormal; Notable for the following:    WBC 21.4 (*)    RBC 2.29 (*)    Hemoglobin 7.4 (*)    HCT 21.1 (*)    RDW 17.7 (*)    Platelets 559 (*)    Lymphocytes Relative 11 (*)    Monocytes Relative 19 (*)    Neutro Abs 14.7 (*)    Monocytes Absolute 4.1 (*)    All other components within normal limits  BASIC METABOLIC PANEL - Abnormal; Notable for the following:    Sodium 131 (*)    Glucose, Bld 124 (*)    All other components within normal limits  RETICULOCYTES - Abnormal; Notable for the following:    Retic Ct Pct 9.7 (*)    RBC. 2.31 (*)    Retic Count, Manual 224.1 (*)    All other components within normal limits  URINALYSIS, ROUTINE W REFLEX MICROSCOPIC - Abnormal; Notable for the following:    Color, Urine AMBER (*)    All other components within normal limits  TROPONIN I  D-DIMER, QUANTITATIVE   Imaging Review Dg Chest 2 View  11/13/2012   CLINICAL DATA:  Left-sided chest pain, history of sickle cell anemia  EXAM: CHEST  2 VIEW  COMPARISON:  10/19/2012  FINDINGS: The cardiac shadow is stable but mildly enlarged. A left chest wall port is again seen and stable. Mild bibasilar atelectatic changes are seen. No focal confluent infiltrate is seen. Mild interstitial changes are noted throughout both lungs. No acute bony abnormality is noted.  IMPRESSION: Bibasilar atelectatic changes superimposed over chronic interstitial change.   Electronically Signed   By: Alcide Clever M.D.   On: 11/13/2012 11:41   Ct Angio Chest Pe W/cm &/or Wo Cm  11/13/2012   CLINICAL DATA:  Sickle cell disease. The left mid chest pain.  EXAM: CT ANGIOGRAPHY CHEST WITH  CONTRAST  TECHNIQUE: Multidetector CT imaging of the chest was performed using the standard protocol during bolus administration of intravenous contrast. Multiplanar CT image reconstructions including MIPs were obtained to evaluate the vascular anatomy.  CONTRAST:  OMNIPAQUE IOHEXOL 350 MG/ML SOLN  COMPARISON:  CT chest 08/22/2012. Plain films of the chest earlier this same day.  FINDINGS: No pulmonary embolus is identified. There is cardiomegaly. Small left pleural effusion is identified. Multiple small mediastinal lymph nodes are again seen, not notably changed. Lungs demonstrate basilar airspace disease, worse on the left, where changes are worrisome for pneumonia. Right basilar airspace disease is more in keeping with atelectasis.  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 Bony changes of sickle cell disease appear stable.  Review of the MIP images confirms the above findings.  IMPRESSION: Small left effusion and basilar airspace disease worrisome for pneumonia.  Negative for pulmonary embolus.  Cardiomegaly.  No change in small mediastinal lymph nodes, likely reactive.   Electronically Signed   By: Drusilla Kanner M.D.   On: 11/13/2012 15:23    EKG Interpretation     Ventricular Rate:  96 PR Interval:  173 QRS Duration: 109 QT Interval:  357 QTC Calculation: 452 R Axis:   -43 Text Interpretation:  Sinus rhythm Consider left atrial enlargement LAD, consider left anterior fascicular block Probable lateral infarct, old            MDM   1. Healthcare-associated pneumonia   2. Sickle cell anemia with pain    pain management. CT angiogram chest reveals a basilar airspace disease consistent with pneumonia. No pulmonary embolism. Rx for healthcare associated pneumonia. Admit to general medicine. Discussed with Dr. Joseph Art.    Donnetta Hutching, MD 11/13/12 [phone]

## 2012-11-13 NOTE — ED Notes (Signed)
Patient transported to CT 

## 2012-11-13 NOTE — Telephone Encounter (Signed)
Received patient call. Patient c/o constant mild chest pain and Left side rib pain lasting a couple days, Rating pain 8-9/10. Patient states pain feels like blood clot is coming back, left side rib pain increases with deep breaths, mid chest pain is a little better today but still having increased left side rib pain with deep breaths. Patient reports abdominal pain to middle abdomen near umbilical area. Patient denies all other pertinent ROS per [facility] triage assessment. Informed patient that this RN will notify the provider of assessment and patient is to receive a return call back with recommendation. Patient acknowledges.

## 2012-11-13 NOTE — ED Notes (Signed)
Bed: ZO10 Expected date:  Expected time:  Means of arrival:  Comments: Two pts in one room

## 2012-11-13 NOTE — Progress Notes (Signed)
ANTIBIOTIC CONSULT NOTE - INITIAL  Pharmacy Consult for vancomycin and cefepime Indication: HCAP  Allergies  Allergen Reactions  . Morphine And Related Hives and Rash    Pt states he can take the pill form, but not IV and he is able to tolerate Dilaudid with no reactions.    Patient Measurements: Height: 6' 0.05" (183 cm) (Documented 11/09/12) Weight: 173 lb 1 oz (78.5 kg) (Documented 11/09/12) IBW/kg (Calculated) : 77.71 Adjusted Body Weight:   Vital Signs: Temp: 99.8 F (37.7 C) (10/14 0933) Temp src: Oral (10/14 0933) BP: 132/76 mmHg (10/14 0933) Pulse Rate: 105 (10/14 0933) Intake/Output from previous day:   Intake/Output from this shift:    Labs:  Recent Labs  11/13/12 1010  WBC 21.4*  HGB 7.4*  PLT 559*  CREATININE 0.53   Estimated Creatinine Clearance: 144.3 ml/min (by C-G formula based on Cr of 0.53). No results found for this basename: VANCOTROUGH, VANCOPEAK, VANCORANDOM, GENTTROUGH, GENTPEAK, GENTRANDOM, TOBRATROUGH, TOBRAPEAK, TOBRARND, AMIKACINPEAK, AMIKACINTROU, AMIKACIN,  in the last 72 hours   Microbiology: No results found for this or any previous visit (from the past 720 hour(s)).  Medical History: Past Medical History  Diagnosis Date  . Sickle cell anemia   . Blood transfusion   . 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 Hypokalemia   . Mood disorder   . Pulmonary embolism   . Avascular necrosis   . Leukocytosis     Chronic  . Thrombocytosis     Chronic  . Hypertension   . C. difficile colitis     Medications:  Scheduled:   Infusions:  . ceFEPime (MAXIPIME) IV    . vancomycin     PRN:  Assessment: 32 yo sickle cell patient with HCAP  Goal of Therapy:  Vancomycin trough level 15-20 mcg/ml  Plan:  1.  Vancomycin 1000mg  IV q 8 hours 2. Cefepime 2 Gm IV q 12 hours  Measure antibiotic drug levels at steady state  Follow up culture results  Loletta Specter 11/13/2012,3:59 PM

## 2012-11-13 NOTE — Telephone Encounter (Signed)
Returned call back to patient. Instructed patient to go to ED for evaluation. Patient acknowledges. Patient reports will be going to ED in about 20 minutes.

## 2012-11-13 NOTE — ED Notes (Signed)
Per pt, states crisis started on Saturday, got worse this am-was told by PCP to come to ED

## 2012-11-14 DIAGNOSIS — D5700 Hb-SS disease with crisis, unspecified: Secondary | ICD-10-CM | POA: Diagnosis not present

## 2012-11-14 LAB — COMPREHENSIVE METABOLIC PANEL
Alkaline Phosphatase: 78 U/L (ref 39–117)
BUN: 6 mg/dL (ref 6–23)
CO2: 23 mEq/L (ref 19–32)
Calcium: 8.7 mg/dL (ref 8.4–10.5)
Chloride: 102 mEq/L (ref 96–112)
Creatinine, Ser: 0.52 mg/dL (ref 0.50–1.35)
GFR calc Af Amer: >90 mL/min (ref >90)
GFR calc non Af Amer: >90 mL/min (ref >90)
Glucose, Bld: 113 mg/dL — ABNORMAL HIGH (ref 70–99)
Potassium: 3.4 mEq/L — ABNORMAL LOW (ref 3.5–5.1)
Sodium: 134 mEq/L — ABNORMAL LOW (ref 135–145)
Total Bilirubin: 5 mg/dL — ABNORMAL HIGH (ref 0.3–1.2)
Total Protein: 6.8 g/dL (ref 6.0–8.3)

## 2012-11-14 LAB — VANCOMYCIN, TROUGH: Vancomycin Tr: <5 ug/mL — ABNORMAL LOW (ref 10.0–20.0)

## 2012-11-14 LAB — CBC WITH DIFFERENTIAL/PLATELET
Basophils Relative: 0 % (ref 0–1)
Eosinophils Absolute: 0.5 10*3/uL (ref 0.0–0.7)
Eosinophils Relative: 2 % (ref 0–5)
HCT: 19.3 % — ABNORMAL LOW (ref 39.0–52.0)
Hemoglobin: 6.5 g/dL — CL (ref 13.0–17.0)
Lymphocytes Relative: 14 % (ref 12–46)
Lymphs Abs: 3.3 10*3/uL (ref 0.7–4.0)
MCH: 31 pg (ref 26.0–34.0)
MCHC: 33.7 g/dL (ref 30.0–36.0)
Monocytes Absolute: 5.5 10*3/uL — ABNORMAL HIGH (ref 0.1–1.0)
Monocytes Relative: 23 % — ABNORMAL HIGH (ref 3–12)
Neutro Abs: 14.5 10*3/uL — ABNORMAL HIGH (ref 1.7–7.7)
Neutrophils Relative %: 61 % (ref 43–77)
RBC: 2.1 MIL/uL — ABNORMAL LOW (ref 4.22–5.81)

## 2012-11-14 MED ORDER — VANCOMYCIN HCL 10 G IV SOLR
1500.0000 mg | Freq: Three times a day (TID) | INTRAVENOUS | Status: DC
  Administered 2012-11-14 – 2012-11-15 (×2): 1500 mg via INTRAVENOUS
  Filled 2012-11-14 (×3): qty 1500

## 2012-11-14 MED ORDER — DEXTROSE 5 % IV SOLN
1.0000 g | Freq: Three times a day (TID) | INTRAVENOUS | Status: DC
  Administered 2012-11-14 – 2012-11-15 (×3): 1 g via INTRAVENOUS
  Filled 2012-11-14 (×4): qty 1

## 2012-11-14 MED ORDER — MORPHINE SULFATE ER 15 MG PO TBCR
15.0000 mg | EXTENDED_RELEASE_TABLET | Freq: Two times a day (BID) | ORAL | Status: DC
  Administered 2012-11-14 – 2012-11-17 (×7): 15 mg via ORAL
  Filled 2012-11-14 (×7): qty 1

## 2012-11-14 NOTE — Progress Notes (Signed)
PHARMACY BRIEF NOTE - Drug Level Result  Consult for:  Vancomycin Indication:  HCAP  The current dose of Vancomycin is 1000 mg IV every 8 hours.  The trough Vancomycin level drawn at 16:30 today, after giving 3 doses, is reported as < 0.5 mcg/ml.  This level is below the therapeutic range, 15-20 mcg/ml.  Plan:  Increase the dose to 1500 mg IV every 8 hours.  Repeat the trough level as needed to guide dosing decisions.  Polo Riley R.Ph. 11/14/2012 6:42 PM

## 2012-11-14 NOTE — Progress Notes (Addendum)
Hemoglobin 6.5 this am. Paged NP Craige Cotta via amion with no return call as of yet nor orders put in.

## 2012-11-14 NOTE — Progress Notes (Signed)
ANTIBIOTIC CONSULT NOTE - Follow-up  Pharmacy Consult for vancomycin and cefepime Indication: HCAP  Allergies  Allergen Reactions  . Morphine And Related Hives and Rash    Pt states he can take the pill form, but not IV and he is able to tolerate Dilaudid with no reactions.    Patient Measurements: Height: 6' 0.05" (183 cm) (Documented 11/09/12) Weight: 175 lb 9.6 oz (79.652 kg) IBW/kg (Calculated) : 77.71 Adjusted Body Weight:   Vital Signs: Temp: 98.4 F (36.9 C) (10/15 0510) Temp src: Oral (10/15 0510) BP: 116/71 mmHg (10/15 0510) Pulse Rate: 88 (10/15 0620) Intake/Output from previous day: 10/14 0701 - 10/15 0700 In: 4741.2 [P.O.:960; I.V.:1531.2; IV Piggyback:2250] Out: 1350 [Urine:1350]  Labs:  Recent Labs  11/13/12 1010 11/14/12 0450 11/14/12 0500  WBC 21.4*  --  23.8*  HGB 7.4*  --  6.5*  PLT 559*  --  512*  CREATININE 0.53 0.52  --    Estimated Creatinine Clearance: 144.3 ml/min (by C-G formula based on Cr of 0.52).   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 Patient has had Left sided rib pain, especially with deep breaths, CT angio negative for PE. Pharmacy has been consulted to dose vancomycin and cefepime  Today is day 2 IV antibiotics  WBC are elevated at 23.8K  Tm/24h 99.8  SCr 0.52 (at patient's baseline), CrCl > 100 ml/min  Wt documented as 79.7 kg  Patient currently on vancomycin 1g IV q8h and cefepime 2g IV q12h  Goal of Therapy:  Vancomycin trough level 15-20 mcg/ml Eradication of infection  Plan:  - change cefepime to 1g IV q8h for pneumonia dosing - continue vancomycin 1g IV q8h - vancomycin trough this afternoon at 1630, prior to 4th dose to guide further dosing - follow-up clinical course, culture results, renal function - follow-up antibiotic  de-escalation and length of therapy  Thank you for the consult.  Tomi Bamberger, PharmD Clinical Pharmacist Pager: [phone] Pharmacy: [phone] 11/14/2012 9:40 AM
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
## 2012-11-15 LAB — COMPREHENSIVE METABOLIC PANEL
ALT: 18 U/L (ref 0–53)
AST: 29 U/L (ref 0–37)
BUN: 4 mg/dL — ABNORMAL LOW (ref 6–23)
CO2: 21 mEq/L (ref 19–32)
Calcium: 8.4 mg/dL (ref 8.4–10.5)
Chloride: 106 mEq/L (ref 96–112)
GFR calc Af Amer: >90 mL/min (ref >90)
GFR calc non Af Amer: >90 mL/min (ref >90)
Glucose, Bld: 103 mg/dL — ABNORMAL HIGH (ref 70–99)
Sodium: 135 mEq/L (ref 135–145)
Total Bilirubin: 4.6 mg/dL — ABNORMAL HIGH (ref 0.3–1.2)

## 2012-11-15 LAB — CBC WITH DIFFERENTIAL/PLATELET
Basophils Absolute: 0.2 10*3/uL — ABNORMAL HIGH (ref 0.0–0.1)
Basophils Relative: 1 % (ref 0–1)
Eosinophils Absolute: 1.3 10*3/uL — ABNORMAL HIGH (ref 0.0–0.7)
HCT: 20.3 % — ABNORMAL LOW (ref 39.0–52.0)
Lymphocytes Relative: 12 % (ref 12–46)
MCH: 30.8 pg (ref 26.0–34.0)
MCHC: 34.5 g/dL (ref 30.0–36.0)
Monocytes Absolute: 3 10*3/uL — ABNORMAL HIGH (ref 0.1–1.0)
Monocytes Relative: 16 % — ABNORMAL HIGH (ref 3–12)
Neutro Abs: 12.1 10*3/uL — ABNORMAL HIGH (ref 1.7–7.7)
Neutrophils Relative %: 64 % (ref 43–77)
Platelets: 508 10*3/uL — ABNORMAL HIGH (ref 150–400)
RBC: 2.27 MIL/uL — ABNORMAL LOW (ref 4.22–5.81)
RDW: 18 % — ABNORMAL HIGH (ref 11.5–15.5)
WBC: 18.9 10*3/uL — ABNORMAL HIGH (ref 4.0–10.5)

## 2012-11-15 IMAGING — CT CT HEAD W/O CM
1 series · 16 of 30 positions shown, 20 images · non-contrast
Comparison: 04/16/2008

CLINICAL DATA: Headache/sickle cell anemia

CT HEAD WITHOUT CONTRAST
TECHNIQUE: Contiguous axial images were obtained from the base of
the skull through the vertex without contrast.

[Series 2: head routine 4.8 h37s · axial · 0.45mm/px · z∈[-121,+34]mm · 16 of 36 slices shown, 20 images]
[im 2/36  brain]
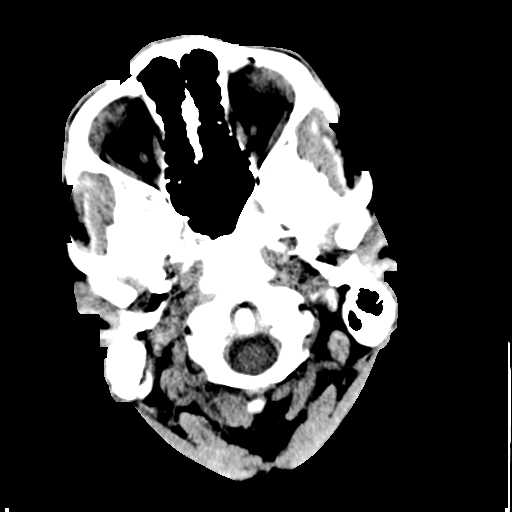
[im 2/36  bone]
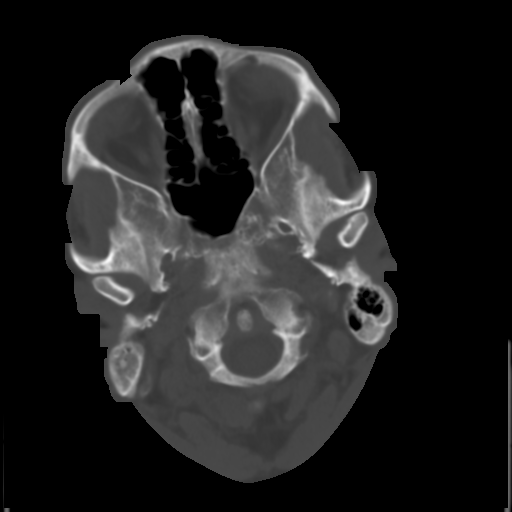
[im 4/36  brain]
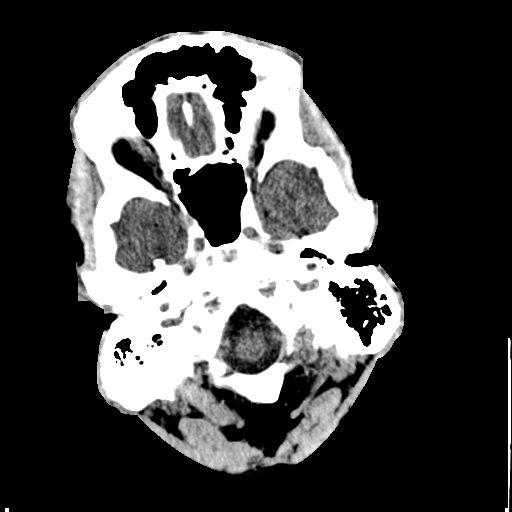
[im 7/36  brain]
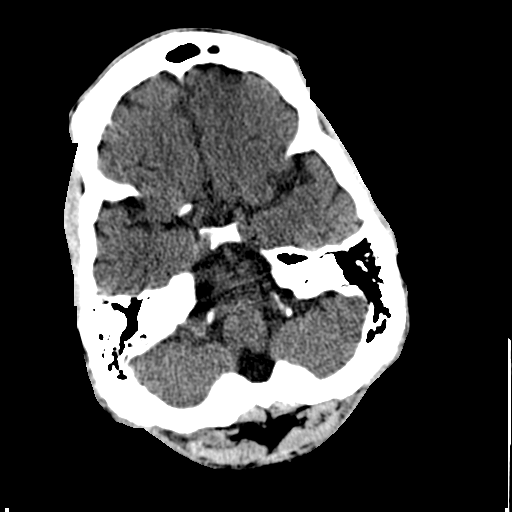
[im 9/36  brain]
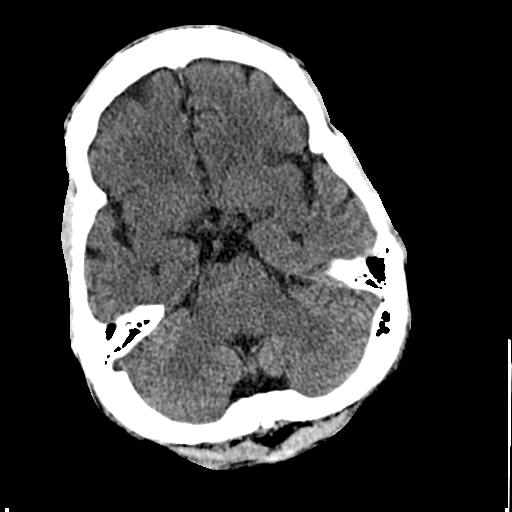
[im 10/36  brain]
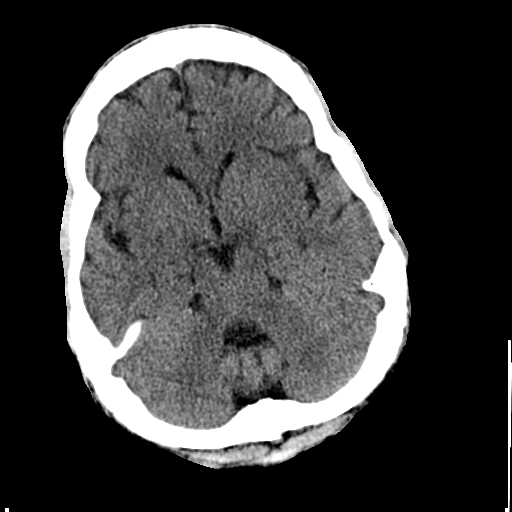
[im 10/36  bone]
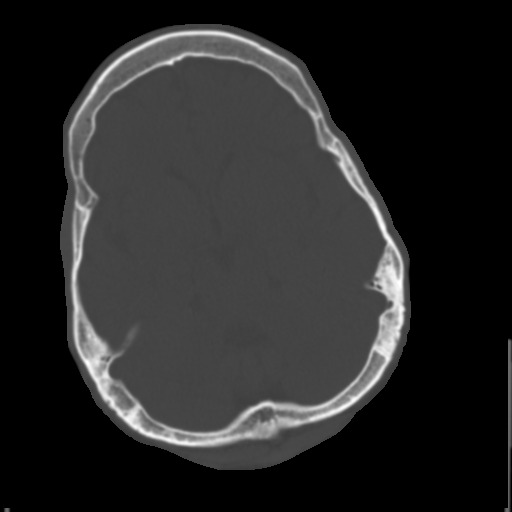
[im 13/36  brain]
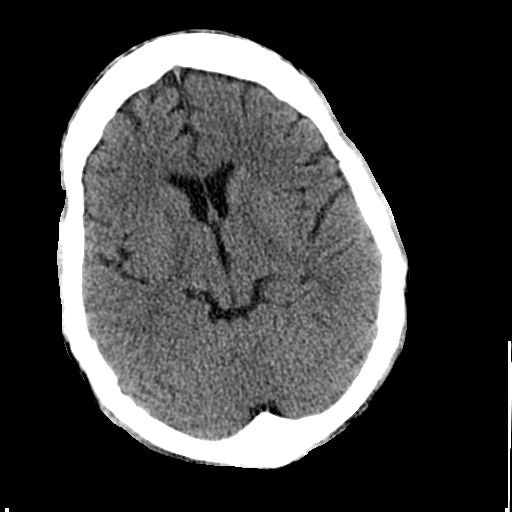
[im 15/36  brain]
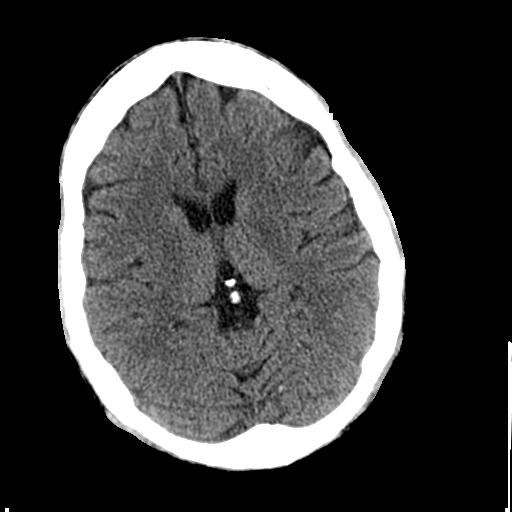
[im 17/36  brain]
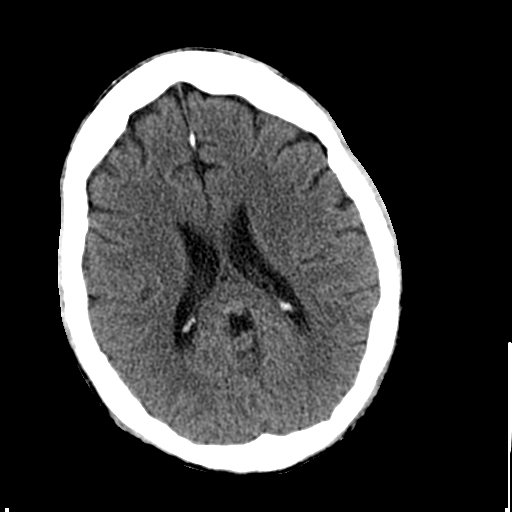
[im 19/36  brain]
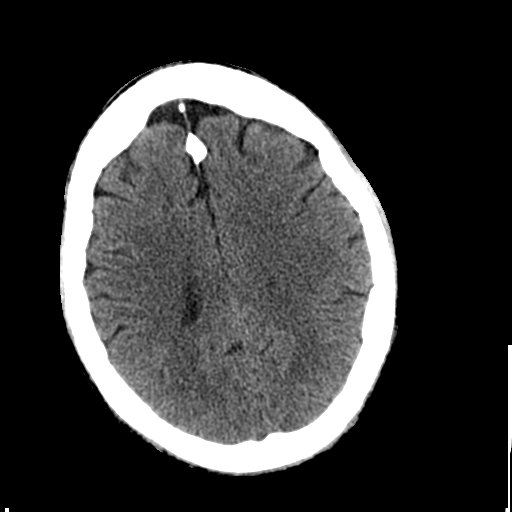
[im 19/36  bone]
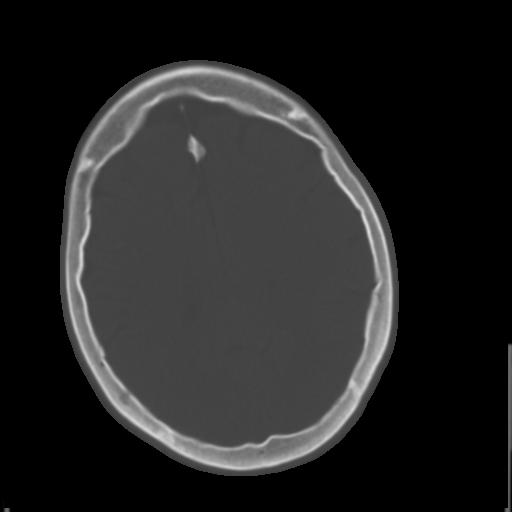
[im 21/36  brain]
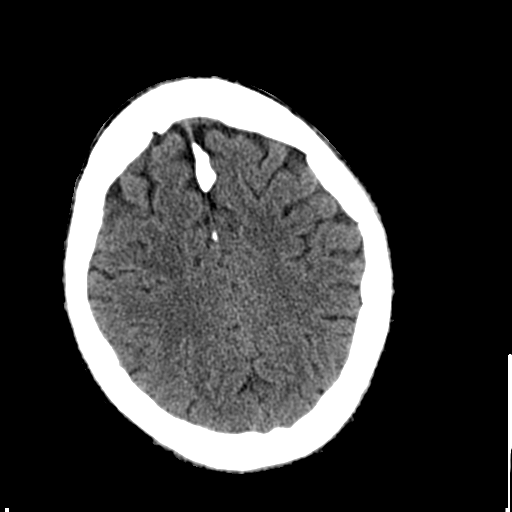
[im 23/36  brain]
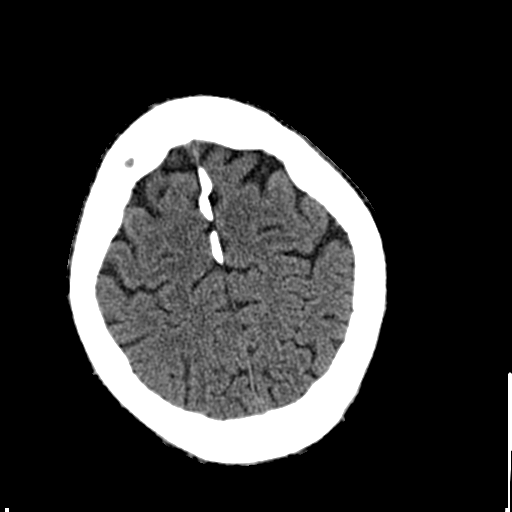
[im 26/36  brain]
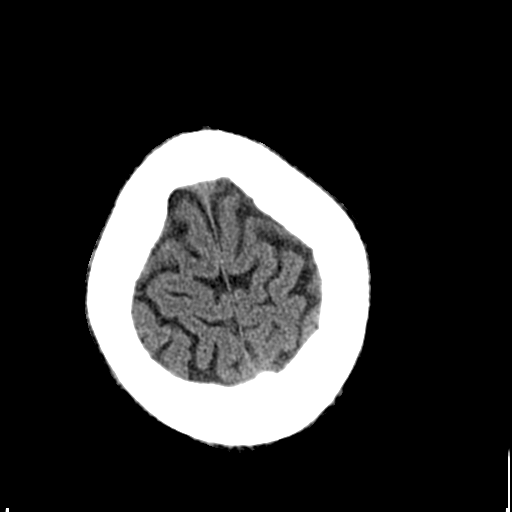
[im 27/36  brain]
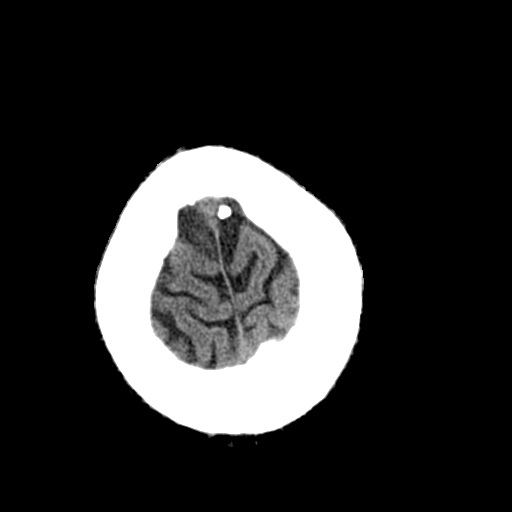
[im 27/36  bone]
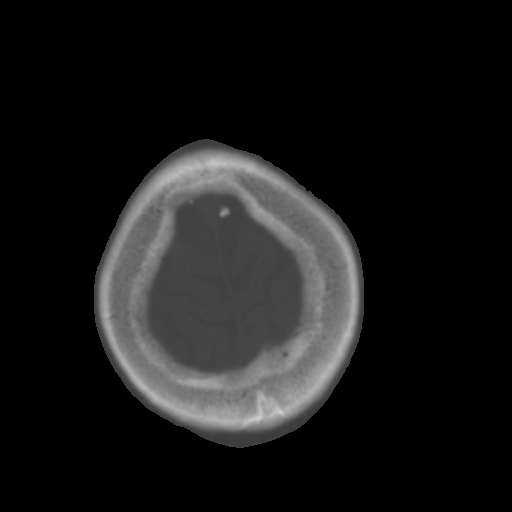
[im 29/36  brain]
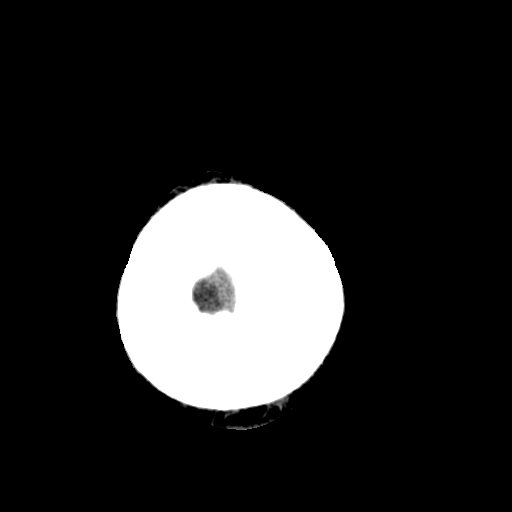
[im 32/36  brain]
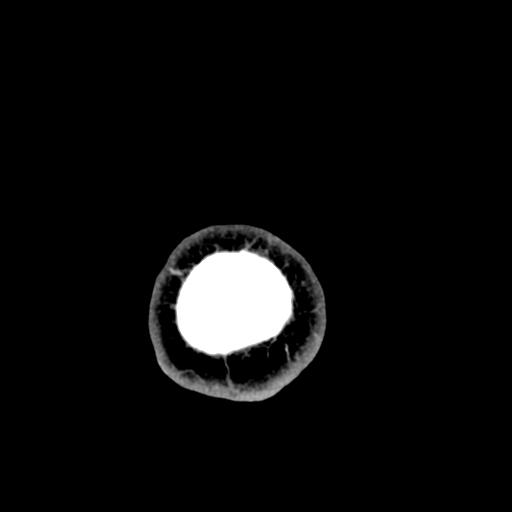
[im 34/36  brain]
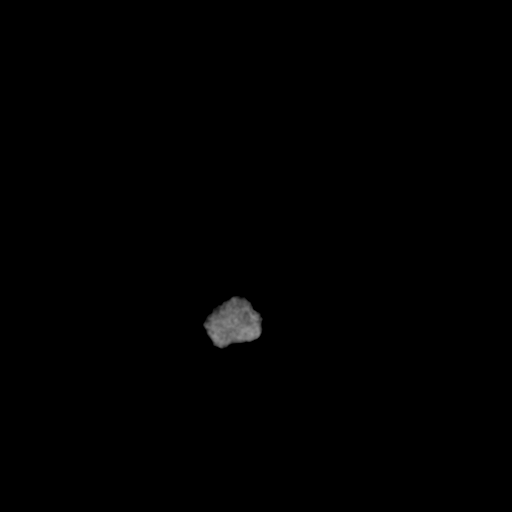

[16 of 30 positions shown; findings below may reference images not displayed]

FINDINGS: Ventricular size and CSF spaces normal.

 No evidence for acute infarct, hemorrhage, or mass lesion. No
extra-axial fluid collections or midline shift.  Calvarium intact.
No fluid in the sinuses visualized.
IMPRESSION: No acute or significant findings.

## 2012-11-15 MED ORDER — LEVOFLOXACIN 750 MG PO TABS
750.0000 mg | ORAL_TABLET | Freq: Every day | ORAL | Status: DC
  Administered 2012-11-15 – 2012-11-18 (×4): 750 mg via ORAL
  Filled 2012-11-15 (×4): qty 1

## 2012-11-15 MED ORDER — DIPHENHYDRAMINE HCL 25 MG PO CAPS: 25.0000 mg | ORAL_CAPSULE | Freq: Four times a day (QID) | ORAL | Status: DC | PRN

## 2012-11-15 NOTE — Discharge Summary (Signed)
PT examined ad discussed with NP [name]. Agree with discharge to home.

## 2012-11-15 NOTE — Plan of Care (Signed)
Problem: Phase I Progression Outcomes Goal: Pulmonary Hygiene as Indicated (Sickle Cell) Outcome: Progressing Respiratory therapist Tabitha  instructed on use of flutter valve to patient.

## 2012-11-15 NOTE — Plan of Care (Signed)
Problem: Phase I Progression Outcomes Goal: Pain controlled with appropriate interventions Outcome: Progressing Pain 6-7/10  In ribs
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
## 2012-11-15 NOTE — Progress Notes (Signed)
Patient's pain is now controlled at a "7"/10. He has used 45 mg of dilaudid since 10:00 yesterday. MD informed

## 2012-11-16 DIAGNOSIS — F39 Unspecified mood [affective] disorder: Secondary | ICD-10-CM

## 2012-11-16 DIAGNOSIS — R0902 Hypoxemia: Secondary | ICD-10-CM

## 2012-11-16 LAB — PREPARE RBC (CROSSMATCH)

## 2012-11-16 MED ORDER — HYDROMORPHONE HCL PF 2 MG/ML IJ SOLN
2.0000 mg | Freq: Once | INTRAMUSCULAR | Status: AC
  Administered 2012-11-16: 2 mg via INTRAVENOUS
  Filled 2012-11-16: qty 1

## 2012-11-16 NOTE — Progress Notes (Signed)
Subjective: Patient seen, he complains of pain left sided ribs and below plus chest pain. He states the pain has been getting wrse over the last few hours. He ranks it as 9/10. He denies any SOB, any heart burn, pain not reproducible. He states he has not been coughing much. He is on a PCa pump, he has used 40.72 mg in last 24 hours, he has made 55 demands and received 55 doses.   Objective: Weight change: 0.862 kg (1 lb 14.4 oz)  Intake/Output Summary (Last 24 hours) at 11/16/12 1234 Last data filed at 11/16/12 0950  Gross per 24 hour  Intake 3674.58 ml  Output   3375 ml  Net 299.58 ml   BP 126/78  Pulse 81  Temp(Src) 98.3 F (36.8 C) (Oral)  Resp 21  Ht 6' (1.829 m)  Wt 80.015 kg (176 lb 6.4 oz)  BMI 23.92 kg/m2  SpO2 0%  General Appearance:    Alert, cooperative, no distress, appears stated age  Head:    Normocephalic, without obvious abnormality, atraumatic  Eyes:    PERRL, conjunctiva/corneas clear        Nose:   Nares normal, septum midline, mucosa normal, no drainage    or sinus tenderness  Throat:   Lips, mucosa, and tongue normal; teeth and gums normal  Neck:   Supple, symmetrical, trachea midline, no adenopathy;       thyroid:  No enlargement/tenderness/nodules; no carotid   bruit or JVD  Back:     Symmetric, no curvature, ROM normal, no CVA tenderness  Lungs:     Clear to auscultation bilaterally, respirations unlabored  Chest wall:    No tenderness or deformity, no reproducible TTP  Heart:    Regular rate and rhythm, S1 and S2 normal, no murmur, rub   or gallop  Abdomen:     Soft, non-tender, bowel sounds active all four quadrants,    no masses, no organomegaly        Extremities:   Extremities normal, atraumatic, no cyanosis or edema  Pulses:   2+ and symmetric all extremities  Skin:   Skin color, texture, turgor normal, no rashes or lesions  Lymph nodes:   Cervical, supraclavicular, and axillary nodes normal  Neurologic:   CNII-XII intact. Normal strength,  sensation and reflexes      throughout    Lab Results:  Recent Labs  11/13/12 1825 11/14/12 0450 11/15/12 0726  NA  --  134* 135  K  --  3.4* 3.5  CL  --  102 106  CO2  --  23 21  GLUCOSE  --  113* 103*  BUN  --  6 4*  CREATININE  --  0.52 0.54  CALCIUM  --  8.7 8.4  MG 1.7  --   --     Recent Labs  11/14/12 0450 11/15/12 0726  AST 31 29  ALT 19 18  ALKPHOS 78 76  BILITOT 5.0* 4.6*  PROT 6.8 7.0  ALBUMIN 3.5 3.6   No results found for this basename: LIPASE, AMYLASE,  in the last 72 hours  Recent Labs  11/14/12 0500 11/15/12 0726  WBC 23.8* 18.9*  NEUTROABS 14.5* 12.1*  HGB 6.5* 7.0*  HCT 19.3* 20.3*  MCV 91.9 89.4  PLT 512* 508*   No results found for this basename: CKTOTAL, CKMB, CKMBINDEX, TROPONINI,  in the last 72 hours No components found with this basename: POCBNP,  No results found for this basename: DDIMER,  in the last 72  hours No results found for this basename: HGBA1C,  in the last 72 hours No results found for this basename: CHOL, HDL, LDLCALC, TRIG, CHOLHDL, LDLDIRECT,  in the last 72 hours No results found for this basename: TSH, T4TOTAL, FREET3, T3FREE, THYROIDAB,  in the last 72 hours No results found for this basename: VITAMINB12, FOLATE, FERRITIN, TIBC, IRON, RETICCTPCT,  in the last 72 hours  Micro Results: Recent Results (from the past 240 hour(s))  CULTURE, BLOOD (ROUTINE X 2)     Status: None   Collection Time    11/13/12  6:05 PM      Result Value Range Status   Specimen Description BLOOD LEFT HAND   Final   Special Requests BOTTLES DRAWN AEROBIC ONLY 3CC   Final   Culture  Setup Time     Final   Value: 11/14/2012 03:02     Performed at [facility]   Culture     Final   Value:        BLOOD CULTURE RECEIVED NO GROWTH TO DATE CULTURE WILL BE HELD FOR 5 DAYS BEFORE ISSUING A FINAL NEGATIVE REPORT     Performed at [facility]   Report Status PENDING   Incomplete    Studies/Results: Dg Chest 2  View  11/13/2012   CLINICAL DATA:  Left-sided chest pain, history of sickle cell anemia  EXAM: CHEST  2 VIEW  COMPARISON:  10/19/2012  FINDINGS: The cardiac shadow is stable but mildly enlarged. A left chest wall port is again seen and stable. Mild bibasilar atelectatic changes are seen. No focal confluent infiltrate is seen. Mild interstitial changes are noted throughout both lungs. No acute bony abnormality is noted.  IMPRESSION: Bibasilar atelectatic changes superimposed over chronic interstitial change.   Electronically Signed   By: Alcide Clever M.D.   On: 11/13/2012 11:41   Dg Chest 2 View  10/19/2012   *RADIOLOGY REPORT*  Clinical Data: Leukocytosis, sickle cell crisis, nonsmoker, subsequent encounter.  CHEST - 2 VIEW  Comparison: 09/27/2012; 08/18/2012; chest CT - 08/27/2012  Findings: Grossly unchanged borderline enlarged cardiac silhouette and mediastinal contours.  Stable positioning of support apparatus. There is grossly unchanged mild diffuse slightly nodular thickening of the pulmonary interstitium.  No new focal airspace opacity.  No pleural effusion or pneumothorax.  Grossly unchanged bones.  Post cholecystectomy.  IMPRESSION: Stable findings of borderline cardiomegaly and bronchitic change without acute cardiopulmonary disease.   Original Report Authenticated By: Tacey Ruiz, MD   Ct Angio Chest Pe W/cm &/or Wo Cm  11/13/2012   CLINICAL DATA:  Sickle cell disease. The left mid chest pain.  EXAM: CT ANGIOGRAPHY CHEST WITH CONTRAST  TECHNIQUE: Multidetector CT imaging of the chest was performed using the standard protocol during bolus administration of intravenous contrast. Multiplanar CT image reconstructions including MIPs were obtained to evaluate the vascular anatomy.  CONTRAST:  OMNIPAQUE IOHEXOL 350 MG/ML SOLN  COMPARISON:  CT chest 08/22/2012. Plain films of the chest earlier this same day.  FINDINGS: No pulmonary embolus is identified. There is cardiomegaly. Small left pleural  effusion is identified. Multiple small mediastinal lymph nodes are again seen, not notably changed. Lungs demonstrate basilar airspace disease, worse on the left, where changes are worrisome for pneumonia. Right basilar airspace disease is more in keeping with atelectasis.  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 Bony changes of sickle cell disease appear stable.  Review of the MIP images confirms the above findings.  IMPRESSION: Small left effusion and basilar airspace disease worrisome for pneumonia.  Negative for pulmonary embolus.  Cardiomegaly.  No change in small mediastinal lymph nodes, likely reactive.   Electronically Signed   By: Drusilla Kanner M.D.   On: 11/13/2012 15:23   Dg Shoulder Left  10/25/2012   CLINICAL DATA:  Left shoulder pain, history sickle cell disease  EXAM: LEFT SHOULDER - 2+ VIEW  COMPARISON:  08/17/2011  FINDINGS: AC joint alignment normal.  Sclerosis identified within the left humeral head compatible with avascular necrosis.  No left humeral head fracture or collapse identified.  Glenohumeral joint alignment normal.  No acute fracture, dislocation or bone destruction.  Adjacent left ribs appear intact.  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  No new osseous abnormalities.   Electronically Signed   By: Ulyses Southward M.D.   On: 10/25/2012 13:04   Medications: Scheduled Meds: . dextromethorphan-guaiFENesin  1 tablet Oral BID  . folic acid  1 mg Oral Daily  .  HYDROmorphone (DILAUDID) injection  2 mg Intravenous Once  . HYDROmorphone PCA 0.3 mg/mL   Intravenous Q4H  . hydroxyurea  1,000 mg Oral Daily  . ketorolac  30 mg Intravenous Q6H  . levofloxacin  750 mg Oral Daily  . morphine  15 mg Oral BID  . potassium chloride SA  20 mEq Oral q morning - 10a  . Rivaroxaban  20 mg Oral Q supper  . senna-docusate  1 tablet Oral BID   Continuous Infusions: .  dextrose 5 % and 0.45% NaCl 1,000 mL (11/16/12 1041)   PRN Meds:.albuterol, bisacodyl, diphenhydrAMINE, ipratropium, naloxone, ondansetron (ZOFRAN) IV, sodium chloride, zolpidem  Assessment/Plan: @PROBHOSP @  LOS: 3 days   A 32 yo man admitted with HCAP. Has history of PE as well as Sickle Cell painful crisis.   HCAP: On Levaquin PO, cefipime and vancomycin discontinued. Cultures negative. Leukocytosis improving.  Continue duonebs and oxygen as needed Left sided chest pain: atypical but will order and EKG and single troponin for now. Single dose dilaudid ordered. Monitor if persist will do stat CT chest. Oxygen status currently normal. On ASA 81 mg Sickle Cell disease with crisis: Likely triggered by the pneumonia. Will continue with the Dilaudid PCA as well as Toradol. On schedule MS contin. History of PE: On Xarelto HTN: stable monitor  Sickle Cell Anemia: Hemoglobin is down to 7.0. Will transfuse a single unit PRBC. Patients hemoglobin at baseline but with complaints of chest and left side pain may be prudent. HDL, Retic counts are high but at baseline. monitor.    Dispo: home Code status: Full Code  Elbie Statzer 11/16/2012, 12:34 PM

## 2012-11-17 ENCOUNTER — Inpatient Hospital Stay (HOSPITAL_COMMUNITY): Payer: Medicare Other

## 2012-11-17 LAB — COMPREHENSIVE METABOLIC PANEL
BUN: 7 mg/dL (ref 6–23)
CO2: 22 mEq/L (ref 19–32)
Calcium: 8.5 mg/dL (ref 8.4–10.5)
Creatinine, Ser: 0.53 mg/dL (ref 0.50–1.35)
GFR calc Af Amer: >90 mL/min (ref >90)
GFR calc non Af Amer: >90 mL/min (ref >90)
Glucose, Bld: 113 mg/dL — ABNORMAL HIGH (ref 70–99)
Total Protein: 6.9 g/dL (ref 6.0–8.3)

## 2012-11-17 LAB — RETICULOCYTES
RBC.: 2.46 MIL/uL — ABNORMAL LOW (ref 4.22–5.81)
Retic Count, Absolute: 255.8 10*3/uL — ABNORMAL HIGH (ref 19.0–186.0)
Retic Ct Pct: 10.4 % — ABNORMAL HIGH (ref 0.4–3.1)

## 2012-11-17 LAB — CBC
HCT: 21.6 % — ABNORMAL LOW (ref 39.0–52.0)
Hemoglobin: 7.5 g/dL — ABNORMAL LOW (ref 13.0–17.0)
MCH: 30.5 pg (ref 26.0–34.0)
MCHC: 34.7 g/dL (ref 30.0–36.0)
MCV: 87.8 fL (ref 78.0–100.0)
RBC: 2.46 MIL/uL — ABNORMAL LOW (ref 4.22–5.81)

## 2012-11-17 LAB — TROPONIN I: Troponin I: <0.3 ng/mL (ref <0.30)

## 2012-11-17 LAB — LACTATE DEHYDROGENASE: LDH: 435 U/L — ABNORMAL HIGH (ref 94–250)

## 2012-11-17 MED ORDER — SODIUM CHLORIDE 0.9 % IJ SOLN
10.0000 mL | INTRAMUSCULAR | Status: DC | PRN
  Administered 2012-11-17 – 2012-11-18 (×2): 10 mL

## 2012-11-17 MED ORDER — DEXTROSE-NACL 5-0.45 % IV SOLN
INTRAVENOUS | Status: DC
  Administered 2012-11-17 – 2012-11-18 (×4): via INTRAVENOUS

## 2012-11-17 MED ORDER — MORPHINE SULFATE ER 30 MG PO TBCR
30.0000 mg | EXTENDED_RELEASE_TABLET | Freq: Two times a day (BID) | ORAL | Status: DC
Start: 2012-11-17 — End: 2012-11-19
  Administered 2012-11-17 – 2012-11-19 (×4): 30 mg via ORAL
  Filled 2012-11-17 (×4): qty 1

## 2012-11-17 MED ORDER — HYDROMORPHONE HCL PF 1 MG/ML IJ SOLN
1.0000 mg | Freq: Once | INTRAMUSCULAR | Status: AC
  Administered 2012-11-17: 20:00:00 1 mg via INTRAVENOUS
  Filled 2012-11-17: qty 1

## 2012-11-17 MED ORDER — MORPHINE SULFATE ER 15 MG PO TBCR
15.0000 mg | EXTENDED_RELEASE_TABLET | Freq: Once | ORAL | Status: AC
  Administered 2012-11-17: 15 mg via ORAL
  Filled 2012-11-17: qty 1

## 2012-11-17 MED ORDER — NITROGLYCERIN 0.4 MG SL SUBL
0.4000 mg | SUBLINGUAL_TABLET | SUBLINGUAL | Status: DC | PRN
  Administered 2012-11-17 (×3): 0.4 mg via SUBLINGUAL
  Filled 2012-11-17: qty 25

## 2012-11-17 MED ORDER — IOHEXOL 300 MG/ML  SOLN
80.0000 mL | Freq: Once | INTRAMUSCULAR | Status: AC | PRN
  Administered 2012-11-17: 80 mL via INTRAVENOUS

## 2012-11-17 NOTE — Progress Notes (Signed)
Notified Dr. Joneen Roach that the patient continues to complain about L chest side pain. Given orders to transfuse 1 unit PRBCs, to give a one time order of morphine sulfate 15 mg, draw stat troponin, and to get a stat CTA. Will continue to monitor patient. Erskin Burnet RN

## 2012-11-17 NOTE — Progress Notes (Signed)
Subjective: Patient seen, complains of left-sided chest pain which he states is a bit improved by comparison to yesterday it is not a sharp. He ranks it as 6/10. She states this is the same pain he's had since admission when he had the CT scan. He denies any shortness of breath or any significant cough. Currently that is his major discomfort. He's uses total of 34.43 mg of Dilaudid the last 24 hours, he made 46 demands and received 46 deliveries. He states to IV Dilaudid he received yesterday helped his discomfort.   Objectivand the other is ine: Weight change: 0.862 kg (1 lb 14.4 oz)  Intake/Output Summary (Last 24 hours) at 11/17/12 0850 Last data filed at 11/17/12 0600  Gross per 24 hour  Intake 2383.75 ml  Output   2350 ml  Net  33.75 ml   BP 130/81  Pulse 72  Temp(Src) 98.2 F (36.8 C) (Oral)  Resp 15  Ht 6' (1.829 m)  Wt 80.876 kg (178 lb 4.8 oz)  BMI 24.18 kg/m2  SpO2 100%  General Appearance:    Alert, cooperative, no distress, appears stated age  Head:    Normocephalic, without obvious abnormality, atraumatic  Eyes:    PERRL, conjunctiva/corneas clear         Nose:   Nares normal, septum midline, mucosa normal, no drainage    or sinus tenderness  Throat:   Lips, mucosa, and tongue normal; teeth and gums normal  Neck:   Supple, symmetrical, trachea midline, no adenopathy;       thyroid:  No enlargement/tenderness/nodules; no carotid   bruit or JVD  Back:     Symmetric, no curvature, ROM normal, no CVA tenderness  Lungs:     Clear to auscultation bilaterally, respirations unlabored  Chest wall:    No tenderness or deformity, no reproducible chest wall pain elicited   Heart:    Regular rate and rhythm, S1 and S2 normal, no murmur, rub   or gallop  Abdomen:     Soft, non-tender, bowel sounds active all four quadrants,    no masses, no organomegaly        Extremities:   Extremities normal, atraumatic, no cyanosis or edema  Pulses:   2+ and symmetric all extremities   Skin:   Skin color, texture, turgor normal, no rashes or lesions  Lymph nodes:   Cervical, supraclavicular, and axillary nodes normal  Neurologic:   CNII-XII intact. Normal strength, sensation and reflexes      throughout    Lab Results:  Recent Labs  11/15/12 0726 11/17/12 0140  NA 135 135  K 3.5 3.8  CL 106 106  CO2 21 22  GLUCOSE 103* 113*  BUN 4* 7  CREATININE 0.54 0.53  CALCIUM 8.4 8.5    Recent Labs  11/15/12 0726 11/17/12 0140  AST 29 25  ALT 18 15  ALKPHOS 76 86  BILITOT 4.6* 2.9*  PROT 7.0 6.9  ALBUMIN 3.6 3.3*   No results found for this basename: LIPASE, AMYLASE,  in the last 72 hours  Recent Labs  11/15/12 0726 11/17/12 0140  WBC 18.9* 17.9*  NEUTROABS 12.1*  --   HGB 7.0* 7.5*  HCT 20.3* 21.6*  MCV 89.4 87.8  PLT 508* 536*    Recent Labs  11/16/12 1305  TROPONINI <0.30   No components found with this basename: POCBNP,  No results found for this basename: DDIMER,  in the last 72 hours No results found for this basename: HGBA1C,  in  the last 72 hours No results found for this basename: CHOL, HDL, LDLCALC, TRIG, CHOLHDL, LDLDIRECT,  in the last 72 hours No results found for this basename: TSH, T4TOTAL, FREET3, T3FREE, THYROIDAB,  in the last 72 hours  Recent Labs  11/17/12 0140  RETICCTPCT 10.4*    Micro Results: Recent Results (from the past 240 hour(s))  CULTURE, BLOOD (ROUTINE X 2)     Status: None   Collection Time    11/13/12  6:05 PM      Result Value Range Status   Specimen Description BLOOD LEFT HAND   Final   Special Requests BOTTLES DRAWN AEROBIC ONLY 3CC   Final   Culture  Setup Time     Final   Value: 11/14/2012 03:02     Performed at [facility]   Culture     Final   Value:        BLOOD CULTURE RECEIVED NO GROWTH TO DATE CULTURE WILL BE HELD FOR 5 DAYS BEFORE ISSUING A FINAL NEGATIVE REPORT     Performed at [facility]   Report Status PENDING   Incomplete    Studies/Results: Dg Chest 2  View  11/13/2012   CLINICAL DATA:  Left-sided chest pain, history of sickle cell anemia  EXAM: CHEST  2 VIEW  COMPARISON:  10/19/2012  FINDINGS: The cardiac shadow is stable but mildly enlarged. A left chest wall port is again seen and stable. Mild bibasilar atelectatic changes are seen. No focal confluent infiltrate is seen. Mild interstitial changes are noted throughout both lungs. No acute bony abnormality is noted.  IMPRESSION: Bibasilar atelectatic changes superimposed over chronic interstitial change.   Electronically Signed   By: Alcide Clever M.D.   On: 11/13/2012 11:41   Dg Chest 2 View  10/19/2012   *RADIOLOGY REPORT*  Clinical Data: Leukocytosis, sickle cell crisis, nonsmoker, subsequent encounter.  CHEST - 2 VIEW  Comparison: 09/27/2012; 08/18/2012; chest CT - 08/27/2012  Findings: Grossly unchanged borderline enlarged cardiac silhouette and mediastinal contours.  Stable positioning of support apparatus. There is grossly unchanged mild diffuse slightly nodular thickening of the pulmonary interstitium.  No new focal airspace opacity.  No pleural effusion or pneumothorax.  Grossly unchanged bones.  Post cholecystectomy.  IMPRESSION: Stable findings of borderline cardiomegaly and bronchitic change without acute cardiopulmonary disease.   Original Report Authenticated By: Tacey Ruiz, MD   Ct Angio Chest Pe W/cm &/or Wo Cm  11/13/2012   CLINICAL DATA:  Sickle cell disease. The left mid chest pain.  EXAM: CT ANGIOGRAPHY CHEST WITH CONTRAST  TECHNIQUE: Multidetector CT imaging of the chest was performed using the standard protocol during bolus administration of intravenous contrast. Multiplanar CT image reconstructions including MIPs were obtained to evaluate the vascular anatomy.  CONTRAST:  OMNIPAQUE IOHEXOL 350 MG/ML SOLN  COMPARISON:  CT chest 08/22/2012. Plain films of the chest earlier this same day.  FINDINGS: No pulmonary embolus is identified. There is cardiomegaly. Small left pleural  effusion is identified. Multiple small mediastinal lymph nodes are again seen, not notably changed. Lungs demonstrate basilar airspace disease, worse on the left, where changes are worrisome for pneumonia. Right basilar airspace disease is more in keeping with atelectasis.  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 Bony changes of sickle cell disease appear stable.  Review of the MIP images confirms the above findings.  IMPRESSION: Small left effusion and basilar airspace disease worrisome for pneumonia.  Negative for pulmonary embolus.  Cardiomegaly.  No change in small mediastinal lymph nodes, likely reactive.   Electronically Signed   By: Drusilla Kanner M.D.   On: 11/13/2012 15:23   Dg Shoulder Left  10/25/2012   CLINICAL DATA:  Left shoulder pain, history sickle cell disease  EXAM: LEFT SHOULDER - 2+ VIEW  COMPARISON:  08/17/2011  FINDINGS: AC joint alignment normal.  Sclerosis identified within the left humeral head compatible with avascular necrosis.  No left humeral head fracture or collapse identified.  Glenohumeral joint alignment normal.  No acute fracture, dislocation or bone destruction.  Adjacent left ribs appear intact.  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  No new osseous abnormalities.   Electronically Signed   By: Ulyses Southward M.D.   On: 10/25/2012 13:04   Medications: Scheduled Meds: . dextromethorphan-guaiFENesin  1 tablet Oral BID  . folic acid  1 mg Oral Daily  . HYDROmorphone PCA 0.3 mg/mL   Intravenous Q4H  . hydroxyurea  1,000 mg Oral Daily  . ketorolac  30 mg Intravenous Q6H  . levofloxacin  750 mg Oral Daily  . morphine  15 mg Oral BID  . potassium chloride SA  20 mEq Oral q morning - 10a  . Rivaroxaban  20 mg Oral Q supper  . senna-docusate  1 tablet Oral BID   Continuous Infusions: . dextrose 5 % and 0.45% NaCl 125 mL/hr at 11/17/12 0209   PRN  Meds:.albuterol, bisacodyl, diphenhydrAMINE, ipratropium, naloxone, ondansetron (ZOFRAN) IV, sodium chloride, zolpidem  Assessment/Plan: @PROBHOSP @  LOS: 4 days   A 32 yo man admitted with HCAP. Has history of PE as well as Sickle Cell painful crisis.  HCAP: On Levaquin PO, chronic leukocytosis now at baseline. Cultures negative. Continue duonebs and oxygen as needed  Left sided chest pain: atypical, EKG and troponin negative.  will transfer patient to monitored floor but doubt cardiac etiology.  Will order d-dimer, if elevated will order CT chest with contrast PE protocol. The patient is alert being treated for PE with Xarelto. We'll increase patient's scheduled MS Contin to 30 mg by mouth twice a day. Oxygen status currently normal. On ASA 81 mg. Will transfer to a monitored floor. Sickle Cell disease with crisis: Likely triggered by the pneumonia. Will continue with the Dilaudid PCA as well as Toradol. On scheduled MS contin.  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 Patient states he no longer smokes.  HTN: stable monitor  Sickle Cell Anemia: Hemoglobin is 7.5 post transfusion of 1 unit PRBC. HDL, Retic counts are high but at baseline. monitor.   Dispo: home  Code status: Full Code   Azarion Hove 11/17/2012, 8:50 AM

## 2012-11-17 NOTE — Progress Notes (Signed)
Result of Hgb noted this am at 7.5. On Call M . Lynch made aware. No new order received.

## 2012-11-17 NOTE — Progress Notes (Signed)
Received order from Dr. Joneen Roach to give nitro SL to patient q 5 minutes to see if L chest side pain relieves. Will monitor patient closely. Erskin Burnet RN

## 2012-11-17 NOTE — Progress Notes (Signed)
Patient's right side pain unrelieved with nitro SL. Dr. Joneen Roach notified by text page. Patient taken to CT scan.  Erskin Burnet RN

## 2012-11-18 DIAGNOSIS — D571 Sickle-cell disease without crisis: Secondary | ICD-10-CM

## 2012-11-18 DIAGNOSIS — J189 Pneumonia, unspecified organism: Secondary | ICD-10-CM

## 2012-11-18 DIAGNOSIS — D5700 Hb-SS disease with crisis, unspecified: Secondary | ICD-10-CM

## 2012-11-18 LAB — CBC
HCT: 24 % — ABNORMAL LOW (ref 39.0–52.0)
Hemoglobin: 8.5 g/dL — ABNORMAL LOW (ref 13.0–17.0)
MCH: 31.1 pg (ref 26.0–34.0)
MCHC: 35.4 g/dL (ref 30.0–36.0)
Platelets: 575 10*3/uL — ABNORMAL HIGH (ref 150–400)
RDW: 18.2 % — ABNORMAL HIGH (ref 11.5–15.5)

## 2012-11-18 LAB — BASIC METABOLIC PANEL
Calcium: 9.2 mg/dL (ref 8.4–10.5)
GFR calc Af Amer: >90 mL/min (ref >90)
GFR calc non Af Amer: >90 mL/min (ref >90)
Glucose, Bld: 118 mg/dL — ABNORMAL HIGH (ref 70–99)
Potassium: 3.7 mEq/L (ref 3.5–5.1)
Sodium: 134 mEq/L — ABNORMAL LOW (ref 135–145)

## 2012-11-18 LAB — TYPE AND SCREEN
ABO/RH(D): O POS
Antibody Screen: NEGATIVE
Unit division: 0
Unit division: 0

## 2012-11-18 LAB — BLOOD GAS, ARTERIAL
Acid-base deficit: 4.2 mmol/L — ABNORMAL HIGH (ref 0.0–2.0)
Bicarbonate: 20.1 mEq/L (ref 20.0–24.0)
O2 Saturation: 92.1 %
Patient temperature: 98.6
TCO2: 19.1 mmol/L (ref 0–100)
pCO2 arterial: 35.4 mmHg (ref 35.0–45.0)

## 2012-11-18 LAB — RETICULOCYTES
RBC.: 2.73 MIL/uL — ABNORMAL LOW (ref 4.22–5.81)
Retic Count, Absolute: 256.6 10*3/uL — ABNORMAL HIGH (ref 19.0–186.0)
Retic Ct Pct: 9.4 % — ABNORMAL HIGH (ref 0.4–3.1)

## 2012-11-18 MED ORDER — VANCOMYCIN HCL 10 G IV SOLR
1500.0000 mg | Freq: Three times a day (TID) | INTRAVENOUS | Status: DC
  Administered 2012-11-18 – 2012-11-19 (×3): 1500 mg via INTRAVENOUS
  Filled 2012-11-18 (×4): qty 1500

## 2012-11-18 MED ORDER — DEXTROSE 5 % IV SOLN
1.0000 g | Freq: Two times a day (BID) | INTRAVENOUS | Status: DC
  Filled 2012-11-18: qty 1

## 2012-11-18 MED ORDER — DEXTROSE 5 % IV SOLN
1.0000 g | Freq: Three times a day (TID) | INTRAVENOUS | Status: DC
  Administered 2012-11-18 – 2012-11-19 (×4): 1 g via INTRAVENOUS
  Filled 2012-11-18 (×4): qty 1

## 2012-11-18 NOTE — Progress Notes (Signed)
ANTIBIOTIC CONSULT NOTE - Follow-up  Pharmacy Consult for vancomycin and cefepime Indication: HCAP  Allergies  Allergen Reactions  . Morphine And Related Hives and Rash    Pt states he can take the pill form, but not IV and he is able to tolerate Dilaudid with no reactions.    Patient Measurements: Height: 6' (182.9 cm) Weight: 178 lb 4.8 oz (80.876 kg) IBW/kg (Calculated) : 77.6  Vital Signs: Temp: 98.2 F (36.8 C) (10/19 0524) Temp src: Oral (10/19 0524) BP: 134/93 mmHg (10/19 0524) Pulse Rate: 70 (10/19 0524) Intake/Output from previous day: 10/18 0701 - 10/19 0700 In: 2367.5 [P.O.:240; I.V.:1765; Blood:362.5] Out: 2425 [Urine:2425]  Labs:  Recent Labs  11/17/12 0140 11/17/12 2338 11/18/12 0500  WBC 17.9*  --  17.7*  HGB 7.5* 8.4* 8.5*  PLT 536*  --  575*  CREATININE 0.53  --  0.56   Estimated Creatinine Clearance: 144.2 ml/min (by C-G formula based on Cr of 0.56).  Anti-infectives: 10/14 >> Vancomycin >> 10/16 10/14 >> Cefepime >> 10/16 10/16 >> Levaquin PO >> 10/19 10/19 >> Cefepime >> 10/19 >> Vancomycin >>   Microbiology: 10/14 blood: ngtd   Assessment: 32 yo sickle cell patient admitted 10/14 with HCAP (recent hospitalization 10/2012) and L pleural effusion as seen on CT angio, Hx PE on chronic Xarelto. Patient has had Left sided rib pain, especially with deep breaths, CT angio negative for PE. Pharmacy initially consulted to dose vancomycin and cefepime on 10/14.  On 10/16, antibiotics were changed to Levaquin PO, but pharmacy re-consulted to dose Vancomycin on 10/19 for continued symptoms.  Today is day 6 total antibiotics (IV and PO)  Tm/24h 99.1  WBC continue to decrease slowly, 17.7  SCr 0.56 (stable at patient's baseline), CrCl > 100 ml/min  Previous vancomycin trough level < 5 (10/15 @ 1630) after receiving 3 doses of vancomycin 1g q8h; dose was changed to 1500mg  q8h before being d/c on 10/16.  Goal of Therapy:  Vancomycin trough level  15-20 mcg/ml Eradication of infection  Plan:   Vancomycin 1500mg  IV q8h.  Measure Vanc trough at steady state.  Follow up renal fxn and culture results.  Follow-up antibiotic de-escalation and length of therapy   Thank you for the consult.  Lynann Beaver PharmD, BCPS Pager [phone] 11/18/2012 10:09 AM

## 2012-11-18 NOTE — Progress Notes (Signed)
Subjective: Patient seen, continues to complain of rib and chest pains. He states that yesterday evening his left-sided chest and rib pain moved to the right side and has been right-sided since.  reported to the intensity of 8/10. It is no worse with deep breathing, movement. He denies any coughing. He now states he has some intermittent right thigh pain that has been present since his the evening. He states the feel like his typical sickle cell discomfort. Yesterday used total of 50.45 mg of Dilaudid in 24 hours. He has 68 demands and 67 deliveries.   Objective: Weight change:   Intake/Output Summary (Last 24 hours) at 11/18/12 0908 Last data filed at 11/18/12 0800  Gross per 24 hour  Intake 2452.53 ml  Output   2725 ml  Net -272.47 ml   BP 134/93  Pulse 70  Temp(Src) 98.2 F (36.8 C) (Oral)  Resp 16  Ht 6' (1.829 m)  Wt 80.876 kg (178 lb 4.8 oz)  BMI 24.18 kg/m2  SpO2 97%  General Appearance:    Alert, cooperative, no distress, appears stated age  Head:    Normocephalic, without obvious abnormality, atraumatic  Eyes:    PERRL, conjunctiva/corneas clear         Nose:   Nares normal, septum midline, mucosa normal, no drainage    or sinus tenderness  Throat:   Lips, mucosa, and tongue normal; teeth and gums normal  Neck:   Supple, symmetrical, trachea midline, no adenopathy;       thyroid:  No enlargement/tenderness/nodules; no carotid   bruit or JVD  Back:     Symmetric, no curvature, ROM normal, no CVA tenderness  Lungs:     Clear to auscultation bilaterally, respirations unlabored  Chest wall:    No tenderness or deformity  Heart:    Regular rate and rhythm, S1 and S2 normal, no murmur, rub   or gallop  Abdomen:     Soft, non-tender, bowel sounds active all four quadrants,    no masses, no organomegaly        Extremities:   Extremities normal, atraumatic, no cyanosis or edema  Pulses:   2+ and symmetric all extremities  Skin:   Skin color, texture, turgor normal, no rashes  or lesions  Lymph nodes:   Cervical, supraclavicular, and axillary nodes normal  Neurologic:   CNII-XII intact. Normal strength, sensation and reflexes      throughout    Lab Results:  Recent Labs  11/17/12 0140 11/18/12 0500  NA 135 134*  K 3.8 3.7  CL 106 103  CO2 22 21  GLUCOSE 113* 118*  BUN 7 7  CREATININE 0.53 0.56  CALCIUM 8.5 9.2    Recent Labs  11/17/12 0140  AST 25  ALT 15  ALKPHOS 86  BILITOT 2.9*  PROT 6.9  ALBUMIN 3.3*   No results found for this basename: LIPASE, AMYLASE,  in the last 72 hours  Recent Labs  11/17/12 0140 11/17/12 2338 11/18/12 0500  WBC 17.9*  --  17.7*  HGB 7.5* 8.4* 8.5*  HCT 21.6* 24.2* 24.0*  MCV 87.8  --  87.9  PLT 536*  --  575*    Recent Labs  11/16/12 1305 11/17/12 1540  TROPONINI <0.30 <0.30   No components found with this basename: POCBNP,   Recent Labs  11/17/12 1240  DDIMER 0.30   No results found for this basename: HGBA1C,  in the last 72 hours No results found for this basename: CHOL, HDL, LDLCALC,  TRIG, CHOLHDL, LDLDIRECT,  in the last 72 hours No results found for this basename: TSH, T4TOTAL, FREET3, T3FREE, THYROIDAB,  in the last 72 hours  Recent Labs  11/17/12 0140 11/18/12 0500  RETICCTPCT 10.4* 9.4*    Micro Results: Recent Results (from the past 240 hour(s))  CULTURE, BLOOD (ROUTINE X 2)     Status: None   Collection Time    11/13/12  6:05 PM      Result Value Range Status   Specimen Description BLOOD LEFT HAND   Final   Special Requests BOTTLES DRAWN AEROBIC ONLY 3CC   Final   Culture  Setup Time     Final   Value: 11/14/2012 03:02     Performed at [facility]   Culture     Final   Value:        BLOOD CULTURE RECEIVED NO GROWTH TO DATE CULTURE WILL BE HELD FOR 5 DAYS BEFORE ISSUING A FINAL NEGATIVE REPORT     Performed at [facility]   Report Status PENDING   Incomplete    Studies/Results: Dg Chest 2 View  11/13/2012   CLINICAL DATA:  Left-sided chest pain,  history of sickle cell anemia  EXAM: CHEST  2 VIEW  COMPARISON:  10/19/2012  FINDINGS: The cardiac shadow is stable but mildly enlarged. A left chest wall port is again seen and stable. Mild bibasilar atelectatic changes are seen. No focal confluent infiltrate is seen. Mild interstitial changes are noted throughout both lungs. No acute bony abnormality is noted.  IMPRESSION: Bibasilar atelectatic changes superimposed over chronic interstitial change.   Electronically Signed   By: Alcide Clever M.D.   On: 11/13/2012 11:41   Dg Chest 2 View  10/19/2012   *RADIOLOGY REPORT*  Clinical Data: Leukocytosis, sickle cell crisis, nonsmoker, subsequent encounter.  CHEST - 2 VIEW  Comparison: 09/27/2012; 08/18/2012; chest CT - 08/27/2012  Findings: Grossly unchanged borderline enlarged cardiac silhouette and mediastinal contours.  Stable positioning of support apparatus. There is grossly unchanged mild diffuse slightly nodular thickening of the pulmonary interstitium.  No new focal airspace opacity.  No pleural effusion or pneumothorax.  Grossly unchanged bones.  Post cholecystectomy.  IMPRESSION: Stable findings of borderline cardiomegaly and bronchitic change without acute cardiopulmonary disease.   Original Report Authenticated By: Tacey Ruiz, MD   Ct Chest W Contrast  11/17/2012   CLINICAL DATA:  Right-sided chest pain. Elevated white count.  EXAM: CT CHEST WITH CONTRAST  TECHNIQUE: Multidetector CT imaging of the chest was performed during intravenous contrast administration.  CONTRAST:  80mL OMNIPAQUE IOHEXOL 300 MG/ML  SOLN  COMPARISON:  11/13/2012  FINDINGS: Left Port-A-Cath remains in place, unchanged. There is mediastinal adenopathy, stable since prior study. Cardiomegaly, stable. Aorta is normal caliber.  Area of consolidation and air bronchograms medially in the right lower lobe, best seen on image 28. Cannot exclude pneumonia. Bilateral lower lobe airspace opacities. The left basilar opacity has improved  since prior study. Findings on the right are similar, likely atelectasis. Trace bilateral pleural effusions.  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 Sickle cell bony changes noted.  IMPRESSION: Airspace disease within the medial right lower lobe concerning for pneumonia. Improving opacity in the left lung base. Similar opacity in the right lung base, likely atelectasis.  Trace bilateral effusions.  Stable cardiomegaly and mildly prominent mediastinal lymph nodes.   Electronically Signed   By: Charlett Nose M.D.   On: 11/17/2012 17:02  Ct Angio Chest Pe W/cm &/or Wo Cm  11/13/2012   CLINICAL DATA:  Sickle cell disease. The left mid chest pain.  EXAM: CT ANGIOGRAPHY CHEST WITH CONTRAST  TECHNIQUE: Multidetector CT imaging of the chest was performed using the standard protocol during bolus administration of intravenous contrast. Multiplanar CT image reconstructions including MIPs were obtained to evaluate the vascular anatomy.  CONTRAST:  OMNIPAQUE IOHEXOL 350 MG/ML SOLN  COMPARISON:  CT chest 08/22/2012. Plain films of the chest earlier this same day.  FINDINGS: No pulmonary embolus is identified. There is cardiomegaly. Small left pleural effusion is identified. Multiple small mediastinal lymph nodes are again seen, not notably changed. Lungs demonstrate basilar airspace disease, worse on the left, where changes are worrisome for pneumonia. Right basilar airspace disease is more in keeping with atelectasis.  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 Bony changes of sickle cell disease appear stable.  Review of the MIP images confirms the above findings.  IMPRESSION: Small left effusion and basilar airspace disease worrisome for pneumonia.  Negative for pulmonary embolus.  Cardiomegaly.  No change in small mediastinal lymph nodes, likely reactive.   Electronically Signed    By: Drusilla Kanner M.D.   On: 11/13/2012 15:23   Dg Shoulder Left  10/25/2012   CLINICAL DATA:  Left shoulder pain, history sickle cell disease  EXAM: LEFT SHOULDER - 2+ VIEW  COMPARISON:  08/17/2011  FINDINGS: AC joint alignment normal.  Sclerosis identified within the left humeral head compatible with avascular necrosis.  No left humeral head fracture or collapse identified.  Glenohumeral joint alignment normal.  No acute fracture, dislocation or bone destruction.  Adjacent left ribs appear intact.  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  No new osseous abnormalities.   Electronically Signed   By: Ulyses Southward M.D.   On: 10/25/2012 13:04   Medications: Scheduled Meds: . dextromethorphan-guaiFENesin  1 tablet Oral BID  . folic acid  1 mg Oral Daily  . HYDROmorphone PCA 0.3 mg/mL   Intravenous Q4H  . hydroxyurea  1,000 mg Oral Daily  . ketorolac  30 mg Intravenous Q6H  . levofloxacin  750 mg Oral Daily  . morphine  30 mg Oral BID  . potassium chloride SA  20 mEq Oral q morning - 10a  . Rivaroxaban  20 mg Oral Q supper  . senna-docusate  1 tablet Oral BID   Continuous Infusions: . dextrose 5 % and 0.45% NaCl 75 mL/hr at 11/18/12 0716   PRN Meds:.albuterol, bisacodyl, diphenhydrAMINE, ipratropium, naloxone, nitroGLYCERIN, ondansetron (ZOFRAN) IV, sodium chloride, sodium chloride, zolpidem  Assessment/Plan: @PROBHOSP @  LOS: 5 days   A 32 yo man admitted with HCAP. Has history of PE as well as Sickle Cell painful crisis.  HCAP: On Levaquin PO, chronic leukocytosis now at baseline. Cultures negative. Continue duonebs and oxygen as needed  Left  and right sided chest/rib pain/leg pain: atypical for cardiac etiology. Workup including d-dimer, EKGs, troponins were all negative. Patient had a repeat CT chest to re-assess pneumonia which is essentially unchanged. Patient has been transfused to a hemoglobin level above baseline, his chest  discomfort remains unchanged.  Patient receiving aggressive doses of Dilaudid from his PCA pump. His MS Contin was increased from 15 to 30 mg by mouth twice daily yesterday, patient states his chest pain/rib pain remains unimproved. Patient is on scheduled Toradol and a heating pad.  Patient's total bili is 2.9 which is  less than his baseline, LDH and reticulocyte count are at his baseline and have not been climbing. Nitroglycerin tried with no effect. The patient has a history of PE and is being treated with Xarelto, intake CTA chest was negative for PE and d-dimer done yesterday is within normal range. Oxygen status currently normal greater than 97%. On ASA 81 mg. continue to monitor, no changes today.  Will order an ABG and duo nebs. Concern for acute chest syndrome. Will d/c PO Levaquin and restart IV antibiotics for continued aggressive treatment of pneumonia. Sickle Cell disease with crisis: Likely triggered by the pneumonia. Will continue with the Dilaudid PCA as well as Toradol. On scheduled MS contin. See above. 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 Patient states he no longer smokes.  HTN: stable monitor  Sickle Cell Anemia: Hemoglobin is 8.5 post transfusion of 2 unit PRBC. HDL, Retic counts are high but at baseline. monitor.   Dispo: home  Code status: Full Code   Carlin Mamone 11/18/2012, 9:08 AM with a

## 2012-11-19 LAB — HEPATIC FUNCTION PANEL
ALT: 18 U/L (ref 0–53)
AST: 25 U/L (ref 0–37)
Bilirubin, Direct: 0.4 mg/dL — ABNORMAL HIGH (ref 0.0–0.3)
Total Bilirubin: 4.2 mg/dL — ABNORMAL HIGH (ref 0.3–1.2)
Total Protein: 8 g/dL (ref 6.0–8.3)

## 2012-11-19 MED ORDER — CELECOXIB 200 MG PO CAPS: 200.0000 mg | ORAL_CAPSULE | Freq: Two times a day (BID) | ORAL | Status: DC

## 2012-11-19 MED ORDER — LEVOFLOXACIN 750 MG PO TABS: 750.0000 mg | ORAL_TABLET | Freq: Every day | ORAL | Status: DC

## 2012-11-19 MED ORDER — HEPARIN SOD (PORK) LOCK FLUSH 100 UNIT/ML IV SOLN
500.0000 [IU] | INTRAVENOUS | Status: AC | PRN
  Administered 2012-11-19: 11:00:00 500 [IU]

## 2012-11-19 MED ORDER — HYDROXYUREA 500 MG PO CAPS: 1000.0000 mg | ORAL_CAPSULE | Freq: Every day | ORAL | Status: DC

## 2012-11-19 NOTE — Progress Notes (Signed)
Patient was ambulated on room air. Oxygen saturation ranged from 94-100%. Dr. Ashley Royalty notified. Erskin Burnet RN
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
## 2012-11-20 LAB — CULTURE, BLOOD (ROUTINE X 2)

## 2012-11-21 ENCOUNTER — Encounter: Payer: Self-pay | Admitting: Internal Medicine

## 2012-11-21 ENCOUNTER — Other Ambulatory Visit: Payer: Self-pay | Admitting: Primary Care

## 2012-11-21 ENCOUNTER — Ambulatory Visit (INDEPENDENT_AMBULATORY_CARE_PROVIDER_SITE_OTHER): Payer: Medicare Other | Admitting: Internal Medicine

## 2012-11-21 VITALS — BP 108/73 | HR 82 | Temp 98.6°F | Resp 16 | Ht 71.5 in | Wt 171.0 lb

## 2012-11-21 DIAGNOSIS — Z283 Underimmunization status: Secondary | ICD-10-CM | POA: Diagnosis not present

## 2012-11-21 DIAGNOSIS — D571 Sickle-cell disease without crisis: Secondary | ICD-10-CM

## 2012-11-21 DIAGNOSIS — I2699 Other pulmonary embolism without acute cor pulmonale: Secondary | ICD-10-CM | POA: Diagnosis not present

## 2012-11-21 DIAGNOSIS — Z23 Encounter for immunization: Secondary | ICD-10-CM

## 2012-11-21 MED ORDER — RIVAROXABAN 20 MG PO TABS
20.0000 mg | ORAL_TABLET | Freq: Every morning | ORAL | Status: DC
Start: 2012-11-21 — End: 2013-04-03

## 2012-11-21 MED ORDER — MORPHINE SULFATE ER 15 MG PO TBCR: 15.0000 mg | EXTENDED_RELEASE_TABLET | Freq: Two times a day (BID) | ORAL | Status: DC

## 2012-11-22 NOTE — Discharge Summary (Signed)
Pt examined and discussed with NP [name] agree with discharge home.

## 2012-11-22 NOTE — Progress Notes (Signed)
  Subjective:    Patient ID: [name], male    DOB: 1980/02/19, 32 y.o.   MRN: [id number]  HPI: Pt here for post hospital follow-up visit. He reports feeling well. He still has not picked up his antibiotic and Celebrex from the Pharmacy both of which were ordered at the time of hospital discharge. Pt states that he still plans to move to [place] as soon as he gets an apartment.    Review of Systems  Constitutional: Negative.   HENT: Negative.   Eyes: Negative.   Respiratory: Negative.   Cardiovascular: Negative.   Gastrointestinal: Negative.   Endocrine: Negative.   Genitourinary: Negative.   Skin: Negative.   Allergic/Immunologic: Negative.   Neurological: Negative.   Hematological: Negative.   Psychiatric/Behavioral: Negative.        Objective:   Physical Exam  Constitutional: He is oriented to person, place, and time. He appears well-developed and well-nourished.  HENT:  Head: Atraumatic.  Eyes: Conjunctivae and EOM are normal. Pupils are equal, round, and reactive to light. No scleral icterus.  Neck: Normal range of motion. Neck supple.  Cardiovascular: Normal rate and regular rhythm.  Exam reveals no gallop and no friction rub.   No murmur heard. Pulmonary/Chest: Effort normal and breath sounds normal. He has no wheezes. He has no rales. He exhibits no tenderness.  Abdominal: Soft. Bowel sounds are normal. He exhibits no mass.  Musculoskeletal: Normal range of motion.  Neurological: He is alert and oriented to person, place, and time.  Skin: Skin is warm and dry.  Psychiatric: He has a normal mood and affect. His behavior is normal. Judgment and thought content normal.          Assessment & Plan:  1. 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 He states that he has been taking his Hydrea since discharge as  well as his folic acid. Pt still has not had his vision examination although he has had multiple referrals. He does not identify a reversible barrier except that he has been busy.   2. Pneumonia: Pt was recently hospitalized and treated for Healthcare acquired pneumonia. He was discharged on Levaquin to complete an 8 day course, however he has not yet picked up the prescription.  3. Mood Disorder in remission: Pt appears to have a well adjusted mood  4. Immunization: TDAP today.

## 2012-11-26 ENCOUNTER — Ambulatory Visit: Payer: Medicare Other | Admitting: Primary Care

## 2012-11-28 ENCOUNTER — Telehealth: Payer: Self-pay | Admitting: Internal Medicine

## 2012-11-28 MED ORDER — ZOLPIDEM TARTRATE 10 MG PO TABS: 10.0000 mg | ORAL_TABLET | Freq: Every evening | ORAL | Status: DC | PRN

## 2012-11-29 ENCOUNTER — Telehealth (HOSPITAL_COMMUNITY): Payer: Self-pay | Admitting: Internal Medicine

## 2012-11-29 ENCOUNTER — Emergency Department (HOSPITAL_COMMUNITY)
Admission: EM | Admit: 2012-11-29 | Discharge: 2012-11-29 | Disposition: A | Discharge status: Home / Self Care | Payer: Medicare Other | Attending: Emergency Medicine | Admitting: Emergency Medicine

## 2012-11-29 ENCOUNTER — Encounter (HOSPITAL_COMMUNITY): Payer: Self-pay | Admitting: Emergency Medicine

## 2012-11-29 ENCOUNTER — Encounter (HOSPITAL_COMMUNITY): Payer: Self-pay

## 2012-11-29 ENCOUNTER — Non-Acute Institutional Stay (HOSPITAL_COMMUNITY)
Admission: AD | Admit: 2012-11-29 | Discharge: 2012-11-29 | Disposition: A | Discharge status: Home / Self Care | Payer: Medicare Other | Attending: Internal Medicine | Admitting: Internal Medicine

## 2012-11-29 DIAGNOSIS — D5700 Hb-SS disease with crisis, unspecified: Secondary | ICD-10-CM | POA: Diagnosis not present

## 2012-11-29 DIAGNOSIS — M87029 Idiopathic aseptic necrosis of unspecified humerus: Secondary | ICD-10-CM | POA: Diagnosis not present

## 2012-11-29 DIAGNOSIS — Z86711 Personal history of pulmonary embolism: Secondary | ICD-10-CM | POA: Insufficient documentation

## 2012-11-29 DIAGNOSIS — D72829 Elevated white blood cell count, unspecified: Secondary | ICD-10-CM | POA: Diagnosis not present

## 2012-11-29 DIAGNOSIS — R109 Unspecified abdominal pain: Secondary | ICD-10-CM | POA: Diagnosis not present

## 2012-11-29 DIAGNOSIS — Z8739 Personal history of other diseases of the musculoskeletal system and connective tissue: Secondary | ICD-10-CM | POA: Diagnosis not present

## 2012-11-29 DIAGNOSIS — Z862 Personal history of diseases of the blood and blood-forming organs and certain disorders involving the immune mechanism: Secondary | ICD-10-CM | POA: Diagnosis not present

## 2012-11-29 DIAGNOSIS — I10 Essential (primary) hypertension: Secondary | ICD-10-CM | POA: Insufficient documentation

## 2012-11-29 DIAGNOSIS — T4275XA Adverse effect of unspecified antiepileptic and sedative-hypnotic drugs, initial encounter: Secondary | ICD-10-CM | POA: Insufficient documentation

## 2012-11-29 DIAGNOSIS — Z79899 Other long term (current) drug therapy: Secondary | ICD-10-CM | POA: Insufficient documentation

## 2012-11-29 DIAGNOSIS — Z791 Long term (current) use of non-steroidal anti-inflammatories (NSAID): Secondary | ICD-10-CM | POA: Insufficient documentation

## 2012-11-29 DIAGNOSIS — G8929 Other chronic pain: Secondary | ICD-10-CM | POA: Insufficient documentation

## 2012-11-29 DIAGNOSIS — G4700 Insomnia, unspecified: Secondary | ICD-10-CM | POA: Insufficient documentation

## 2012-11-29 DIAGNOSIS — Z8659 Personal history of other mental and behavioral disorders: Secondary | ICD-10-CM | POA: Insufficient documentation

## 2012-11-29 DIAGNOSIS — Z8639 Personal history of other endocrine, nutritional and metabolic disease: Secondary | ICD-10-CM | POA: Insufficient documentation

## 2012-11-29 DIAGNOSIS — D571 Sickle-cell disease without crisis: Secondary | ICD-10-CM | POA: Diagnosis not present

## 2012-11-29 DIAGNOSIS — Z87891 Personal history of nicotine dependence: Secondary | ICD-10-CM | POA: Insufficient documentation

## 2012-11-29 DIAGNOSIS — R52 Pain, unspecified: Secondary | ICD-10-CM | POA: Insufficient documentation

## 2012-11-29 DIAGNOSIS — Z8619 Personal history of other infectious and parasitic diseases: Secondary | ICD-10-CM | POA: Insufficient documentation

## 2012-11-29 DIAGNOSIS — K5909 Other constipation: Secondary | ICD-10-CM | POA: Insufficient documentation

## 2012-11-29 LAB — BASIC METABOLIC PANEL
BUN: 9 mg/dL (ref 6–23)
CO2: 23 mEq/L (ref 19–32)
Calcium: 9.4 mg/dL (ref 8.4–10.5)
Chloride: 105 mEq/L (ref 96–112)
Creatinine, Ser: 0.7 mg/dL (ref 0.50–1.35)
GFR calc Af Amer: >90 mL/min (ref >90)
GFR calc non Af Amer: >90 mL/min (ref >90)
Glucose, Bld: 105 mg/dL — ABNORMAL HIGH (ref 70–99)
Potassium: 3.5 mEq/L (ref 3.5–5.1)
Sodium: 137 mEq/L (ref 135–145)

## 2012-11-29 LAB — COMPREHENSIVE METABOLIC PANEL
Albumin: 3.8 g/dL (ref 3.5–5.2)
Alkaline Phosphatase: 90 U/L (ref 39–117)
BUN: 6 mg/dL (ref 6–23)
CO2: 21 mEq/L (ref 19–32)
Chloride: 106 mEq/L (ref 96–112)
Creatinine, Ser: 0.56 mg/dL (ref 0.50–1.35)
GFR calc non Af Amer: >90 mL/min (ref >90)
Potassium: 3.5 mEq/L (ref 3.5–5.1)
Sodium: 137 mEq/L (ref 135–145)
Total Bilirubin: 4 mg/dL — ABNORMAL HIGH (ref 0.3–1.2)

## 2012-11-29 LAB — RETICULOCYTES
RBC.: 2.43 MIL/uL — ABNORMAL LOW (ref 4.22–5.81)
Retic Count, Absolute: 94.8 10*3/uL (ref 19.0–186.0)
Retic Ct Pct: 3.9 % — ABNORMAL HIGH (ref 0.4–3.1)

## 2012-11-29 LAB — CBC
HCT: 21.8 % — ABNORMAL LOW (ref 39.0–52.0)
Hemoglobin: 7.5 g/dL — ABNORMAL LOW (ref 13.0–17.0)
MCH: 30.9 pg (ref 26.0–34.0)
MCHC: 34.4 g/dL (ref 30.0–36.0)
MCV: 89.7 fL (ref 78.0–100.0)
Platelets: 771 10*3/uL — ABNORMAL HIGH (ref 150–400)
RBC: 2.43 MIL/uL — ABNORMAL LOW (ref 4.22–5.81)
RDW: 17.7 % — ABNORMAL HIGH (ref 11.5–15.5)
WBC: 18.4 10*3/uL — ABNORMAL HIGH (ref 4.0–10.5)

## 2012-11-29 MED ORDER — DIPHENHYDRAMINE HCL 50 MG/ML IJ SOLN
25.0000 mg | Freq: Once | INTRAMUSCULAR | Status: AC
  Administered 2012-11-29: 25 mg via INTRAVENOUS
  Filled 2012-11-29: qty 1

## 2012-11-29 MED ORDER — DIPHENHYDRAMINE HCL 25 MG PO CAPS: 25.0000 mg | ORAL_CAPSULE | ORAL | Status: DC | PRN

## 2012-11-29 MED ORDER — HYDROXYUREA 500 MG PO CAPS: 1000.0000 mg | ORAL_CAPSULE | Freq: Every day | ORAL | Status: DC

## 2012-11-29 MED ORDER — MORPHINE SULFATE ER 15 MG PO TBCR: 15.0000 mg | EXTENDED_RELEASE_TABLET | Freq: Two times a day (BID) | ORAL | Status: DC

## 2012-11-29 MED ORDER — NALOXONE HCL 0.4 MG/ML IJ SOLN: 0.4000 mg | INTRAMUSCULAR | Status: DC | PRN

## 2012-11-29 MED ORDER — HYDROMORPHONE HCL PF 2 MG/ML IJ SOLN
2.0000 mg | Freq: Once | INTRAMUSCULAR | Status: AC
  Administered 2012-11-29: 2 mg via INTRAVENOUS
  Filled 2012-11-29: qty 1

## 2012-11-29 MED ORDER — HYDROMORPHONE HCL 4 MG PO TABS: 4.0000 mg | ORAL_TABLET | ORAL | Status: DC | PRN

## 2012-11-29 MED ORDER — SODIUM CHLORIDE 0.9 % IV BOLUS (SEPSIS): 1000.0000 mL | INTRAVENOUS | Status: DC

## 2012-11-29 MED ORDER — DEXTROSE-NACL 5-0.45 % IV SOLN
INTRAVENOUS | Status: DC
  Administered 2012-11-29: 14:00:00 via INTRAVENOUS

## 2012-11-29 MED ORDER — HEPARIN SOD (PORK) LOCK FLUSH 100 UNIT/ML IV SOLN
500.0000 [IU] | INTRAVENOUS | Status: AC | PRN
  Administered 2012-11-29: 500 [IU]

## 2012-11-29 MED ORDER — HYDROMORPHONE 0.3 MG/ML IV SOLN
INTRAVENOUS | Status: DC
  Administered 2012-11-29: 16:00:00 via INTRAVENOUS
  Administered 2012-11-29: 7.19 mg via INTRAVENOUS
  Administered 2012-11-29: 6.94 mg via INTRAVENOUS
  Administered 2012-11-29: 14:00:00 via INTRAVENOUS
  Filled 2012-11-29 (×2): qty 25

## 2012-11-29 MED ORDER — HEPARIN SOD (PORK) LOCK FLUSH 100 UNIT/ML IV SOLN
500.0000 [IU] | INTRAVENOUS | Status: DC | PRN
  Filled 2012-11-29: qty 5

## 2012-11-29 MED ORDER — SODIUM CHLORIDE 0.9 % IJ SOLN: 10.0000 mL | INTRAMUSCULAR | Status: DC | PRN

## 2012-11-29 MED ORDER — DIPHENHYDRAMINE HCL 12.5 MG/5ML PO ELIX: 12.5000 mg | ORAL_SOLUTION | Freq: Four times a day (QID) | ORAL | Status: DC | PRN

## 2012-11-29 MED ORDER — KETOROLAC TROMETHAMINE 30 MG/ML IJ SOLN
30.0000 mg | Freq: Once | INTRAMUSCULAR | Status: AC
  Administered 2012-11-29: 30 mg via INTRAVENOUS
  Filled 2012-11-29: qty 1

## 2012-11-29 MED ORDER — HEPARIN SOD (PORK) LOCK FLUSH 100 UNIT/ML IV SOLN
500.0000 [IU] | Freq: Once | INTRAVENOUS | Status: AC
  Administered 2012-11-29: 500 [IU]
  Filled 2012-11-29: qty 5

## 2012-11-29 MED ORDER — SODIUM CHLORIDE 0.9 % IJ SOLN: 9.0000 mL | INTRAMUSCULAR | Status: DC | PRN

## 2012-11-29 MED ORDER — SODIUM CHLORIDE 0.9 % IV BOLUS (SEPSIS)
1000.0000 mL | INTRAVENOUS | Status: AC
  Administered 2012-11-29: 1000 mL via INTRAVENOUS

## 2012-11-29 MED ORDER — POTASSIUM CHLORIDE CRYS ER 20 MEQ PO TBCR: 20.0000 meq | EXTENDED_RELEASE_TABLET | Freq: Every morning | ORAL | Status: DC

## 2012-11-29 MED ORDER — RIVAROXABAN 20 MG PO TABS: 20.0000 mg | ORAL_TABLET | Freq: Every morning | ORAL | Status: DC

## 2012-11-29 MED ORDER — DIPHENHYDRAMINE HCL 50 MG/ML IJ SOLN: 12.5000 mg | Freq: Four times a day (QID) | INTRAMUSCULAR | Status: DC | PRN

## 2012-11-29 MED ORDER — SODIUM CHLORIDE 0.9 % IJ SOLN
10.0000 mL | INTRAMUSCULAR | Status: AC | PRN
  Administered 2012-11-29: 10 mL

## 2012-11-29 MED ORDER — ONDANSETRON HCL 4 MG/2ML IJ SOLN: 4.0000 mg | Freq: Four times a day (QID) | INTRAMUSCULAR | Status: DC | PRN

## 2012-11-29 MED ORDER — FOLIC ACID 1 MG PO TABS: 1.0000 mg | ORAL_TABLET | Freq: Every morning | ORAL | Status: DC

## 2012-11-29 NOTE — ED Notes (Signed)
Pt having side and stomach pain r/t sickle cell crisis.

## 2012-11-29 NOTE — Discharge Instructions (Signed)
Sickle Cell Anemia Sickle cell disease is a general term for hemoglobin abnormalities in which the sickle gene is inherited from at least one parent. Sickle cell anemia or hemoglobin SS disease is the condition in which the sickle gene is inherited from both parents. A smaller number have hemoglobin SS disease. Sickle cell hemoglobin causes the red blood cells to become deformed and break up. There are a number of medical problems that happen in people with SS disease. These include:  Pain crisis. This is caused by small blood vessels being blocked. This is due to infection or dehydration. There is usually severe pain in the muscles, joints, back, or stomach. Treatment may include IV fluids, transfusion, and pain medicine. Hospital care is often needed.  Severe anemia. This often follows an infection. Symptoms include severe shortness of breath, weakness, and shock. Transfusions are needed to correct the problem.  Complications. People with sickle cell disease are more likely to get infections, strokes, eye problems, and heart failure. Athletes with sickle cell trait need to be very careful to avoid dehydration because of the increased risk of pain crisis. Always drink before, during, and after exercising in the heat. Avoid extreme exertion at altitudes above 2,500 feet (762 meters) or if you feel sick. Many patients in pain crisis or with severe anemia need hospital care to treat the problem. SEEK IMMEDIATE MEDICAL CARE IF:   You or your child develops dizziness or fainting, numbness in or difficulty with movement of arms and legs, difficulty with speech, or is acting abnormally.  You have a fever.  Your baby is older than 3 months with a rectal temperature of 102 F (38.9 C) or higher.  Your baby is 67 months old or younger with a rectal temperature of 100.4 F (38 C) or higher.  With fevers, do not give medicine to lower the fever right away. This could cover up a problem that is  developing.  You or your child has other signs of infection (chills, lethargy, irritability, poor eating, or vomiting).  You or your child develops pain which is not helped with medications.  You or your child develops shortness of breath or coughs up pus-like or bloody sputum.  You or your child develops any problems that are new and are causing you to worry.  You or your child develops a persistent, often uncomfortable and painful penile erection. This is called priapism. Always check young boys for this. It is often embarrassing for them and they may not bring it to your attention. This is a medical emergency and needs immediate treatment. If this is not treated it will lead to impotence.  You or your child develops a new onset of abdominal pain, especially on the left side near the stomach area.  You have any questions about your child or problems that you feel are not getting better. Return immediately if you feel you or your child is getting worse, even if seen only a short while ago. Document Released: 02/25/2004 Document Revised: 04/11/2011 Document Reviewed: 06/04/2009 [facility] Patient Information 2014 [place], [place].

## 2012-11-29 NOTE — Progress Notes (Addendum)
Patient ID: [name], male   DOB: 1980/10/08, 32 y.o.   MRN: [id number] Pt alert and oriented at discharge; pt states that he needs to leave by 1730 today; discharge instructions given, explained, and signed; port deaccessed with no complications noted; pt ambulatory; pt states pain level is 4/10 and he will be able to manage at home

## 2012-11-29 NOTE — ED Provider Notes (Signed)
Medical screening examination/treatment/procedure(s) were performed by non-physician practitioner and as supervising physician I was immediately available for consultation/collaboration.  EKG Interpretation   None        Bhavin Monjaraz K Laquinda Moller-Rasch, MD 11/29/12 [phone]

## 2012-11-29 NOTE — H&P (Signed)
[facility] History and Physical   Date: 11/29/2012  Patient name: [name] Medical record number: [id number] Date of birth: 03-27-1980 Age: 32 y.o. Gender: male PCP: Gerald Waiters A., MD  Attending physician: Gerald Harm, MD  Chief Complaint: bilateral rib pain  History of Present Illness: [name] was seen earlier today in the ED and was discharge. At that time he felt he would be able to manage his pain. However his pain intensity increased as the morning progress. He rates his pain at 7/10 and localizes it to ribs. , back and legs. He describes his pain as throbbing and consistent non radiating. He denies any N/V/D, dizziness, SOB or any other associated symptoms. He is here for management of symptoms.  Meds: Prescriptions prior to admission  Medication Sig Dispense Refill  . celecoxib (CELEBREX) 200 MG capsule Take 1 capsule (200 mg total) by mouth 2 (two) times daily.  60 capsule  1  . folic acid (FOLVITE) 1 MG tablet Take 1 tablet (1 mg total) by mouth every morning.  30 tablet  11  . HYDROmorphone (DILAUDID) 4 MG tablet Take 1 tablet (4 mg total) by mouth every 4 (four) hours as needed for pain.  90 tablet  0  . hydroxyurea (HYDREA) 500 MG capsule Take 2 capsules (1,000 mg total) by mouth daily. May take with food to minimize GI side effects.  60 capsule  1  . levofloxacin (LEVAQUIN) 750 MG tablet Take 1 tablet (750 mg total) by mouth daily.  8 tablet  0  . morphine (MS CONTIN) 15 MG 12 hr tablet Take 1 tablet (15 mg total) by mouth 2 (two) times daily.  60 tablet  0  . potassium chloride SA (K-DUR,KLOR-CON) 20 MEQ tablet Take 1 tablet (20 mEq total) by mouth every morning.  30 tablet  3  . Rivaroxaban (XARELTO) 20 MG TABS tablet Take 1 tablet (20 mg total) by mouth every morning.  30 tablet  2  . zolpidem (AMBIEN) 10 MG tablet Take 1 tablet (10 mg total) by mouth at bedtime as needed for sleep.  30 tablet  2    Allergies: Morphine and  related Past Medical History  Diagnosis Date  . Sickle cell anemia   . Blood transfusion   . 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 Hypokalemia   . Mood disorder   . Pulmonary embolism   . Avascular necrosis   . Leukocytosis     Chronic  . Thrombocytosis     Chronic  . Hypertension   . C. difficile colitis    Past Surgical History  Procedure Laterality Date  . Right hip replacement      08/2006  . Cholecystectomy      01/2008  . Porta cath placement    . Porta cath removal    . Umbilical hernia repair      01/2008  . Excision of left periauricular cyst      10/2009  . Excision of right ear lobe cyst with primary closur      11/2007  . Portacath placement  01/05/2012    Procedure: INSERTION PORT-A-CATH;  Surgeon: Gerald Pollack, MD;  Location: [facility] OR;  Service: General;  Laterality: N/A;  ultrasound guiced port a cath insertion with fluoroscopy   Family History  Problem Relation Age of Onset  . Sickle cell anemia Mother   . Sickle cell anemia Father   . Sickle cell trait Brother  History   Social History  . Marital Status: Single    Spouse Name: N/A    Number of Children: 0  . Years of Education: 13   Occupational History  . 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 Smoking status: Former Smoker -- 13 years    Types: Cigarettes    Quit date: 07/08/2010  . Smokeless tobacco: Never Used  . Alcohol Use: No  . Drug Use: No     Comment: quit smoking 2011  . Sexual Activity: Yes    Partners: Female    Pharmacist, hospital Protection: None   Other Topics Concern  . Not on file   Social History Narrative   Lives in an apartment.  Single.  Lives with girlfriend.  Does not use any assist devices.        [name]:  [phone] Mom, emergency contact    Review of Systems: Musculoskeletal:positive for rib pain.   Physical Exam: Blood pressure 135/90, pulse 90, temperature  98.2 F (36.8 C), temperature source Oral, resp. rate 16, SpO2 100.00%. BP 135/90  Pulse 90  Temp(Src) 98.2 F (36.8 C) (Oral)  Resp 16  SpO2 100%  General Appearance:    Alert, cooperative, no distress, appears stated age  Head:    Normocephalic, without obvious abnormality, atraumatic  Eyes:    PERRL, conjunctiva/corneas clear, EOM's intact, fundi    benign  Ears:    Normal TM's and external ear canals, both ears, cerumen bilateral   Nose:   Nares normal, septum midline, mucosa normal, no drainage    or sinus tenderness  Throat:   Lips, mucosa, and tongue normal; teeth and gums normal  Neck:   Supple, symmetrical, trachea midline, no adenopathy;       thyroid:  No enlargement/tenderness/nodules; no carotid   bruit or JVD  Back:     Symmetric, no curvature, ROM normal, no CVA tenderness  Lungs:     Clear to auscultation bilaterally, respirations unlabored  Chest wall:    No tenderness or deformity  Heart:    Regular rate and rhythm, S1 and S2 normal, no murmur, rub   or gallop  Abdomen:     Soft, non-tender, bowel sounds active all four quadrants,    no masses, no organomegaly        Extremities:   Extremities normal, atraumatic, no cyanosis or edema  Pulses:   2+ and symmetric all extremities  Skin:   Skin color, texture, turgor normal, no rashes or lesions  Lymph nodes:   Cervical, supraclavicular, and axillary nodes normal  Neurologic:   CNII-XII intact. Normal strength    Lab results: Results for orders placed during the hospital encounter of 11/29/12 (from the past 24 hour(s))  CBC     Status: Abnormal   Collection Time    11/29/12  2:56 AM      Result Value Range   WBC 18.4 (*) 4.0 - 10.5 K/uL   RBC 2.43 (*) 4.22 - 5.81 MIL/uL   Hemoglobin 7.5 (*) 13.0 - 17.0 g/dL   HCT 47.8 (*) 29.5 - 62.1 %   MCV 89.7  78.0 - 100.0 fL   MCH 30.9  26.0 - 34.0 pg   MCHC 34.4  30.0 - 36.0 g/dL   RDW 30.8 (*) 65.7 - 84.6 %   Platelets 771 (*) 150 - 400 K/uL  BASIC METABOLIC PANEL      Status:  Abnormal   Collection Time    11/29/12  2:56 AM      Result Value Range   Sodium 137  135 - 145 mEq/L   Potassium 3.5  3.5 - 5.1 mEq/L   Chloride 105  96 - 112 mEq/L   CO2 23  19 - 32 mEq/L   Glucose, Bld 105 (*) 70 - 99 mg/dL   BUN 9  6 - 23 mg/dL   Creatinine, Ser 1.61  0.50 - 1.35 mg/dL   Calcium 9.4  8.4 - 09.6 mg/dL   GFR calc non Af Amer >90  >90 mL/min   GFR calc Af Amer >90  >90 mL/min  RETICULOCYTES     Status: Abnormal   Collection Time    11/29/12  2:56 AM      Result Value Range   Retic Ct Pct 3.9 (*) 0.4 - 3.1 %   RBC. 2.43 (*) 4.22 - 5.81 MIL/uL   Retic Count, Manual 94.8  19.0 - 186.0 K/uL    Imaging results:  No results found.   Assessment & Plan:  Vaso occlusive crisis with active hemoysis:Gentle hydration with D51/2 NS at 125cc/hr, Custom PCA hydromorphone for pain management  Antipruitics and antiemetics prn for associated side effects from analgesics. Continue mainteanace medication of Folic acid 1mg  and hydroxyurea 1000 mg QD. Acute on chronic pain: continue long acting analgesic Adjunct therapy Toradol 30mg  IV for inflammatory/reactive process.  Leucocytosis: secondary to inflammatory/reactive process with VOC, no s/s of infection. Adjunct therapy Toradol 30mg  Q 6hrs. Resume Celebrex after d/c Hx of hypokalemia: stable at this time continue Kdur QD  Hx of PE continue Xarelto 20mg  QD  Constipation secondary to narcotic use: Senokot S    Toshiyuki Fredell P 11/29/2012, 2:14 PM

## 2012-11-29 NOTE — ED Provider Notes (Signed)
CSN: [id number]     Arrival date & time 11/29/12  0145 History   First MD Initiated Contact with Patient 11/29/12 [phone]     No chief complaint on file.  (Consider location/radiation/quality/duration/timing/severity/associated sxs/prior Treatment) HPI Pt is a 32yo male with hx of sickle cell anemia presenting with sickle cell pain in his abdomen and sides that stared on Tuesday, 10/27 and has gradually worsened.  Pt has been admitted several times for sickle cell crisis.  Today pain is constant, 8/10, has need been relieved with home prescribed diluadid or MS contin.  Pain feels like his typical sickle cell pain. Denies chest, back, or leg pain. Denies fever, n/v/d.  Past Medical History  Diagnosis Date  . Sickle cell anemia   . Blood transfusion   . 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 Hypokalemia   . Mood disorder   . Pulmonary embolism   . Avascular necrosis   . Leukocytosis     Chronic  . Thrombocytosis     Chronic  . Hypertension   . C. difficile colitis    Past Surgical History  Procedure Laterality Date  . Right hip replacement      08/2006  . Cholecystectomy      01/2008  . Porta cath placement    . Porta cath removal    . Umbilical hernia repair      01/2008  . Excision of left periauricular cyst      10/2009  . Excision of right ear lobe cyst with primary closur      11/2007  . Portacath placement  01/05/2012    Procedure: INSERTION PORT-A-CATH;  Surgeon: Adolph Pollack, MD;  Location: [facility] OR;  Service: General;  Laterality: N/A;  ultrasound guiced port a cath insertion with fluoroscopy   Family History  Problem Relation Age of Onset  . Sickle cell anemia Mother   . Sickle cell anemia Father   . Sickle cell trait Brother    History  Substance Use Topics  . Smoking status: Former Smoker -- 13 years    Types: Cigarettes    Quit date: 07/08/2010  . Smokeless tobacco: Never Used  . Alcohol Use: No    Review of Systems   Constitutional: Negative for fever and chills.  Cardiovascular: Negative for chest pain.  Gastrointestinal: Positive for abdominal pain. Negative for nausea, vomiting and diarrhea.  Musculoskeletal: Negative for back pain and myalgias.  All other systems reviewed and are negative.    Allergies  Morphine and related  Home Medications   Current Outpatient Rx  Name  Route  Sig  Dispense  Refill  . celecoxib (CELEBREX) 200 MG capsule   Oral   Take 1 capsule (200 mg total) by mouth 2 (two) times daily.   60 capsule   1   . folic acid (FOLVITE) 1 MG tablet   Oral   Take 1 tablet (1 mg total) by mouth every morning.   30 tablet   11   . HYDROmorphone (DILAUDID) 4 MG tablet   Oral   Take 1 tablet (4 mg total) by mouth every 4 (four) hours as needed for pain.   90 tablet   0   . hydroxyurea (HYDREA) 500 MG capsule   Oral   Take 2 capsules (1,000 mg total) by mouth daily. May take with food to minimize GI side effects.   60 capsule   1   . levofloxacin (LEVAQUIN) 750 MG tablet  Oral   Take 1 tablet (750 mg total) by mouth daily.   8 tablet   0   . morphine (MS CONTIN) 15 MG 12 hr tablet   Oral   Take 1 tablet (15 mg total) by mouth 2 (two) times daily.   60 tablet   0   . potassium chloride SA (K-DUR,KLOR-CON) 20 MEQ tablet   Oral   Take 1 tablet (20 mEq total) by mouth every morning.   30 tablet   3   . Rivaroxaban (XARELTO) 20 MG TABS tablet   Oral   Take 1 tablet (20 mg total) by mouth every morning.   30 tablet   2   . zolpidem (AMBIEN) 10 MG tablet   Oral   Take 1 tablet (10 mg total) by mouth at bedtime as needed for sleep.   30 tablet   2    BP 130/70  Pulse 80  Temp(Src) 98.1 F (36.7 C) (Oral)  Resp 20  Ht 6' (1.829 m)  Wt 173 lb (78.472 kg)  BMI 23.46 kg/m2  SpO2 100% Physical Exam  Nursing note and vitals reviewed. Constitutional: He appears well-developed and well-nourished.  HENT:  Head: Normocephalic and atraumatic.  Eyes:  Conjunctivae are normal. No scleral icterus.  Neck: Normal range of motion.  Cardiovascular: Normal rate, regular rhythm and normal heart sounds.   Pulmonary/Chest: Effort normal and breath sounds normal. No respiratory distress. He has no wheezes. He has no rales. He exhibits no tenderness.  Abdominal: Soft. Bowel sounds are normal. He exhibits no distension and no mass. There is tenderness ( diffuse, greatest on left and right side). There is no rebound and no guarding.  Musculoskeletal: Normal range of motion. He exhibits no edema and no tenderness.  Neurological: He is alert.  Skin: Skin is warm and dry. No rash noted. No erythema.    ED Course  Procedures (including critical care time) Labs Review Labs Reviewed  CBC - Abnormal; Notable for the following:    WBC 18.4 (*)    RBC 2.43 (*)    Hemoglobin 7.5 (*)    HCT 21.8 (*)    RDW 17.7 (*)    Platelets 771 (*)    All other components within normal limits  BASIC METABOLIC PANEL - Abnormal; Notable for the following:    Glucose, Bld 105 (*)    All other components within normal limits  RETICULOCYTES - Abnormal; Notable for the following:    Retic Ct Pct 3.9 (*)    RBC. 2.43 (*)    All other components within normal limits   Imaging Review No results found.  EKG Interpretation   None       MDM   1. Sickle cell anemia with pain    Will get routine blood work as pt has been admitted multiple times for sickle cell crisis.  Will attempt to treat pain in ED.  Denies fever, chest pain, or SOB.  Not concerned for acute chest syndrome. Plan to discharge home is able to manage pt's pain.  Tx: dilaudid, fluids, and benadryl.    Pt states he feels comfortable being discharged home after 4mg  IV dilaudid. Pain went down from 8/10 to 6/10.    All labs discussed with patient. All questions answered and concerns addressed. Will discharge pt home and have pt f/u with his PCP, Dr. Ashley Royalty. Return precautions given. Pt verbalized  understanding and agreement with tx plan. Vitals: unremarkable. Discharged in stable condition.    Discussed  pt with attending during ED encounter and agrees with plan.     Junius Finner, PA-C 11/29/12 [phone]

## 2012-11-29 NOTE — Discharge Summary (Signed)
[facility] Discharge Summary   Patient ID: [name] MRN: [id number] DOB/AGE: [birth year] 32 y.o.  Admit date: 11/29/2012 Discharge date: 11/29/2012  Primary Care Physician:  [name],Troye Hiemstra A., MD  Admission Diagnoses:  Active Problems: Vaso occlusive crisis with active hemoysis Acute on chronic pain  Hx of PE  Discharge Diagnoses:   Vaso occlusive crisis with active hemoysis Acute on chronic pain  Insomnia   Hx of PE  Discharge Medications:    Medication List    TAKE these medications       celecoxib 200 MG capsule  Commonly known as:  CELEBREX  Take 1 capsule (200 mg total) by mouth 2 (two) times daily.     folic acid 1 MG tablet  Commonly known as:  FOLVITE  Take 1 tablet (1 mg total) by mouth every morning.     HYDROmorphone 4 MG tablet  Commonly known as:  DILAUDID  Take 1 tablet (4 mg total) by mouth every 4 (four) hours as needed for pain.     hydroxyurea 500 MG capsule  Commonly known as:  HYDREA  Take 2 capsules (1,000 mg total) by mouth daily. May take with food to minimize GI side effects.     levofloxacin 750 MG tablet  Commonly known as:  LEVAQUIN  Take 1 tablet (750 mg total) by mouth daily.     morphine 15 MG 12 hr tablet  Commonly known as:  MS CONTIN  Take 1 tablet (15 mg total) by mouth 2 (two) times daily.     Rivaroxaban 20 MG Tabs tablet  Commonly known as:  XARELTO  Take 1 tablet (20 mg total) by mouth every morning.     zolpidem 10 MG tablet  Commonly known as:  AMBIEN  Take 1 tablet (10 mg total) by mouth at bedtime as needed for sleep.      ASK your doctor about these medications       potassium chloride SA 20 MEQ tablet  Commonly known as:  K-DUR,KLOR-CON  Take 1 tablet (20 mEq total) by mouth every morning.         Consults:  None  Significant Diagnostic Studies:  Dg Chest 2 View  10/19/2012   *RADIOLOGY REPORT*  Clinical Data: Leukocytosis, sickle cell crisis, nonsmoker, subsequent  encounter.  CHEST - 2 VIEW  Comparison: 09/27/2012; 08/18/2012; chest CT - 08/27/2012  Findings: Grossly unchanged borderline enlarged cardiac silhouette and mediastinal contours.  Stable positioning of support apparatus. There is grossly unchanged mild diffuse slightly nodular thickening of the pulmonary interstitium.  No new focal airspace opacity.  No pleural effusion or pneumothorax.  Grossly unchanged bones.  Post cholecystectomy.  IMPRESSION: Stable findings of borderline cardiomegaly and bronchitic change without acute cardiopulmonary disease.   Original Report Authenticated By: Tacey Ruiz, MD   Dg Shoulder Left  10/25/2012   CLINICAL DATA:  Left shoulder pain, history sickle cell disease  EXAM: LEFT SHOULDER - 2+ VIEW  COMPARISON:  08/17/2011  FINDINGS: AC joint alignment normal.  Sclerosis identified within the left humeral head compatible with avascular necrosis.  No left humeral head fracture or collapse identified.  Glenohumeral joint alignment normal.  No acute fracture, dislocation or bone destruction.  Adjacent left ribs appear intact.  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  No new osseous abnormalities.   Electronically Signed   By: Ulyses Southward M.D.   On: 10/25/2012 13:04     [facility]  Course:  For complete details please refer to admission H and P, but in brief, 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 He called the [facility] for pain not relieved with home analgesic after being d/c from the ED. His pain is located bilateral ribs. He was admitted for management of symptoms. Treatment included: Vaso occlusive crisis: IVF for hydration, custom PCA for pain management   Wit 1.5 mg loading dose and .8 mg Q 10 mins with  A 15 mg lock out in 4 hrs. Total given 14.13 received. 16 Demands  with  16 deliveries. Pain is down 5/10.     Acute on chronic pain: Continued long acting MS contin  And a prescription will be given  for hydromorphone  Hx of Pulmonary emboli: continue xareto.   Physical Exam at Discharge:  BP 107/70  Pulse 70  Temp(Src) 98.3 F (36.8 C) (Oral)  Resp 16  SpO2 99%  NWG:NFAOZ, cooperative, no distress, appears stated age  Cardiovascular:Regular rate and rhythm, S1 and S2 normal, no murmur, rub or gallop  Respiratory:Clear to auscultation bilaterally, respirations unlabored  Gastrointestinal:Soft, non-tender, bowel sounds active all four quadrants,  no masses, no organomegaly  Extremities:normal, atraumatic, no cyanosis or edema  Pulses: 2+ and symmetric all extremities   Disposition at Discharge: 01-Home or Self Care  Discharge Orders:  Future Appointments Provider Department Dept Phone   12/20/2012 1:30 PM Altha Harm, MD [facility] [phone]      Condition at Discharge:   Stable  Time spent on Discharge:  Greater than 30 minutes.  Signed: Hernan Turnage P 11/29/2012, 5:06 PM

## 2012-11-30 NOTE — Telephone Encounter (Signed)
Close encounter 

## 2012-12-04 ENCOUNTER — Telehealth: Payer: Self-pay | Admitting: Internal Medicine

## 2012-12-04 ENCOUNTER — Encounter (HOSPITAL_COMMUNITY): Payer: Self-pay | Admitting: *Deleted

## 2012-12-04 ENCOUNTER — Non-Acute Institutional Stay (HOSPITAL_COMMUNITY)
Admission: AD | Admit: 2012-12-04 | Discharge: 2012-12-04 | Disposition: A | Discharge status: Home / Self Care | Payer: Medicare Other | Attending: Internal Medicine | Admitting: Internal Medicine

## 2012-12-04 DIAGNOSIS — D5700 Hb-SS disease with crisis, unspecified: Secondary | ICD-10-CM | POA: Diagnosis not present

## 2012-12-04 DIAGNOSIS — R079 Chest pain, unspecified: Secondary | ICD-10-CM | POA: Insufficient documentation

## 2012-12-04 DIAGNOSIS — I10 Essential (primary) hypertension: Secondary | ICD-10-CM | POA: Diagnosis not present

## 2012-12-04 DIAGNOSIS — Z79899 Other long term (current) drug therapy: Secondary | ICD-10-CM | POA: Insufficient documentation

## 2012-12-04 DIAGNOSIS — D571 Sickle-cell disease without crisis: Secondary | ICD-10-CM | POA: Diagnosis not present

## 2012-12-04 DIAGNOSIS — M79609 Pain in unspecified limb: Secondary | ICD-10-CM | POA: Insufficient documentation

## 2012-12-04 DIAGNOSIS — M545 Low back pain, unspecified: Secondary | ICD-10-CM | POA: Diagnosis not present

## 2012-12-04 DIAGNOSIS — Z86718 Personal history of other venous thrombosis and embolism: Secondary | ICD-10-CM | POA: Diagnosis not present

## 2012-12-04 LAB — CBC WITH DIFFERENTIAL/PLATELET
Basophils Relative: 1 % (ref 0–1)
Eosinophils Absolute: 1.1 10*3/uL — ABNORMAL HIGH (ref 0.0–0.7)
Lymphocytes Relative: 16 % (ref 12–46)
MCH: 30.6 pg (ref 26.0–34.0)
MCHC: 33.6 g/dL (ref 30.0–36.0)
Monocytes Relative: 15 % — ABNORMAL HIGH (ref 3–12)
Neutrophils Relative %: 62 % (ref 43–77)
Platelets: 629 10*3/uL — ABNORMAL HIGH (ref 150–400)
RBC: 2.42 MIL/uL — ABNORMAL LOW (ref 4.22–5.81)
WBC: 15.6 10*3/uL — ABNORMAL HIGH (ref 4.0–10.5)

## 2012-12-04 MED ORDER — HYDROMORPHONE HCL 4 MG PO TABS
8.0000 mg | ORAL_TABLET | Freq: Once | ORAL | Status: AC
  Administered 2012-12-04: 8 mg via ORAL
  Filled 2012-12-04: qty 2

## 2012-12-04 MED ORDER — HYDROMORPHONE 0.3 MG/ML IV SOLN
INTRAVENOUS | Status: DC
Start: 2012-12-04 — End: 2012-12-04
  Administered 2012-12-04: 7.11 mg via INTRAVENOUS
  Administered 2012-12-04: 12:00:00 via INTRAVENOUS
  Administered 2012-12-04: 8.32 mg via INTRAVENOUS
  Administered 2012-12-04: 4.79 mg via INTRAVENOUS
  Administered 2012-12-04 (×2): via INTRAVENOUS
  Filled 2012-12-04 (×3): qty 25

## 2012-12-04 MED ORDER — DIPHENHYDRAMINE HCL 25 MG PO CAPS
25.0000 mg | ORAL_CAPSULE | Freq: Once | ORAL | Status: AC
  Administered 2012-12-04: 25 mg via ORAL
  Filled 2012-12-04: qty 1

## 2012-12-04 MED ORDER — MORPHINE SULFATE ER 15 MG PO TBCR: 15.0000 mg | EXTENDED_RELEASE_TABLET | Freq: Two times a day (BID) | ORAL | Status: DC

## 2012-12-04 MED ORDER — SODIUM CHLORIDE 0.9 % IJ SOLN: 9.0000 mL | INTRAMUSCULAR | Status: DC | PRN

## 2012-12-04 MED ORDER — SODIUM CHLORIDE 0.9 % IJ SOLN
10.0000 mL | INTRAMUSCULAR | Status: AC | PRN
  Administered 2012-12-04: 10 mL

## 2012-12-04 MED ORDER — HYDROMORPHONE HCL PF 2 MG/ML IJ SOLN
3.0000 mg | Freq: Once | INTRAMUSCULAR | Status: AC
  Administered 2012-12-04: 3 mg via INTRAVENOUS
  Filled 2012-12-04: qty 2

## 2012-12-04 MED ORDER — HYDROMORPHONE HCL 4 MG PO TABS: ORAL_TABLET | ORAL | Status: DC

## 2012-12-04 MED ORDER — SODIUM CHLORIDE 0.9 % IJ SOLN
INTRAMUSCULAR | Status: AC
  Administered 2012-12-04: 10 mL
  Filled 2012-12-04: qty 10

## 2012-12-04 MED ORDER — FOLIC ACID 1 MG PO TABS
1.0000 mg | ORAL_TABLET | Freq: Every day | ORAL | Status: DC
  Administered 2012-12-04: 1 mg via ORAL
  Filled 2012-12-04: qty 1

## 2012-12-04 MED ORDER — HEPARIN SOD (PORK) LOCK FLUSH 100 UNIT/ML IV SOLN
500.0000 [IU] | INTRAVENOUS | Status: AC | PRN
  Administered 2012-12-04: 500 [IU]
  Filled 2012-12-04: qty 5

## 2012-12-04 MED ORDER — DEXTROSE-NACL 5-0.45 % IV SOLN
INTRAVENOUS | Status: DC
  Administered 2012-12-04: 12:00:00 via INTRAVENOUS

## 2012-12-04 MED ORDER — RIVAROXABAN 20 MG PO TABS
20.0000 mg | ORAL_TABLET | Freq: Every morning | ORAL | Status: DC
  Administered 2012-12-04: 20 mg via ORAL
  Filled 2012-12-04: qty 1

## 2012-12-04 MED ORDER — KETOROLAC TROMETHAMINE 30 MG/ML IJ SOLN
30.0000 mg | Freq: Four times a day (QID) | INTRAMUSCULAR | Status: DC
  Administered 2012-12-04: 30 mg via INTRAVENOUS
  Filled 2012-12-04: qty 1

## 2012-12-04 MED ORDER — ONDANSETRON HCL 4 MG/2ML IJ SOLN: 4.0000 mg | Freq: Four times a day (QID) | INTRAMUSCULAR | Status: DC | PRN

## 2012-12-04 MED ORDER — NALOXONE HCL 0.4 MG/ML IJ SOLN: 0.4000 mg | INTRAMUSCULAR | Status: DC | PRN

## 2012-12-04 MED ORDER — DIPHENHYDRAMINE HCL 25 MG PO CAPS
25.0000 mg | ORAL_CAPSULE | Freq: Four times a day (QID) | ORAL | Status: DC | PRN
  Administered 2012-12-04: 50 mg via ORAL
  Filled 2012-12-04: qty 2

## 2012-12-04 NOTE — Progress Notes (Signed)
Patient discharged home. VSS. Patient in no apparent distress. Discharge instructions reviewed, patient verbalizes understanding. Patient left day hospital ambulatory with belongings via self.

## 2012-12-04 NOTE — Discharge Summary (Signed)
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 He rates the pain as 7/10, non radiating and aching in legs and sharp in ribs. He took 8 mg of dilaudid orally as instructed to do when pain increases but there was no decrease in the pain. Pt has had the "sniffles" but no cough or fever. He denies fatigue.  He has been compliant with Hydroxyurea and folic acid   Hospital Course:  1. Hb SS with crisis: Pt presented with early crisis. HE was treated  initially with bolus dilaudid and was continued on  MS Contin. He was then started on a PCA at his individualized dose and toradol and heat were given as adjunctive treatment. Pt also received IVF. Re-evaluation in 4 hours revealed that pain was decreasing. Pt received a dose of 8 mg of diluadid . At the time of discharge, pt's pain was decreased to 4/10 He was instructed to take Dilaudid 8 mg scheduled every 4 hours then call in 24 hours for phone follow up. He utilized a total of 20.22 mg of Dilaudid and had 26 demands and 26 deliveries.    Discharge Exam: Filed Vitals:   12/04/12 1611  BP: 121/74  Pulse: 82  Temp: 98.8 F (37.1 C)  Resp: 18   Filed Vitals:   12/04/12 1411 12/04/12 1504 12/04/12 1511 12/04/12 1611  BP: 123/83  113/75 121/74  Pulse: 78  78 82  Temp: 97.8 F (36.6 C)  98.6 F (37 C) 98.8 F (37.1 C)  TempSrc: Axillary  Oral  Oral  Resp: 19 17 20 18   SpO2: 96% 97% 99% 96%   General: Alert, awake, oriented x3, in no acute distress.   HEENT: [place]/AT PEERL, EOMI, anicteric  Heart: Regular rate and rhythm, without murmurs, rubs, gallops.  Lungs: Clear to auscultation, no wheezing or rhonchi noted.  Abdomen: Soft, nontender, nondistended, positive bowel sounds, no masses no hepatosplenomegaly noted.  Neuro: No focal neurological deficits noted cranial nerves II through XII grossly intact. Strength normal in bilateral upper and lower extremities.  Musculoskeletal: No warm swelling or erythema around joints.  Psychiatric: Patient alert and oriented x3, good insight and cognition, good recent to remote recall.  Lymph node survey: No cervical axillary or inguinal lymphadenopathy noted.   Discharge Instructions  Discharge Orders   Future Appointments Provider Department Dept Phone   12/20/2012 1:30 PM Altha Harm, MD [place] [facility] [phone]   Future Orders Complete By Expires   Activity as tolerated - No restrictions  As directed    Diet general  As directed        Medication List    STOP taking these medications       levofloxacin 750 MG tablet  Commonly known as:  LEVAQUIN      TAKE these medications       celecoxib 200 MG capsule  Commonly known as:  CELEBREX  Take 1 capsule (200 mg total) by mouth 2 (two) times daily.     folic acid 1 MG tablet  Commonly known as:  FOLVITE  Take 1 tablet (1 mg total) by mouth every morning.     HYDROmorphone 4 MG tablet  Commonly known as:  DILAUDID  Take 8 mg every 4 hours for next 24 hours then resume 4 mg every 4 hours as needed for pain.     hydroxyurea 500 MG capsule  Commonly known as:  HYDREA  Take 2 capsules (1,000 mg total) by mouth daily. May take with food to minimize GI side effects.     morphine 15 MG 12 hr tablet  Commonly known as:  MS CONTIN  Take 1 tablet (15 mg total) by mouth 2 (two) times daily.     potassium  chloride SA 20 MEQ tablet  Commonly known as:  K-DUR,KLOR-CON  Take 1 tablet (20 mEq total) by mouth every morning.     Rivaroxaban 20 MG Tabs tablet  Commonly known as:  XARELTO  Take 1 tablet (20 mg total) by mouth every morning.     zolpidem 10 MG tablet  Commonly known as:  AMBIEN  Take 1 tablet (10 mg total) by mouth at bedtime as needed for sleep.          The results of significant diagnostics from this hospitalization (including imaging, microbiology, ancillary and laboratory) are listed below for reference.    Significant Diagnostic Studies: Dg Chest 2 View  11/13/2012   CLINICAL DATA:  Left-sided chest pain, history of sickle cell anemia  EXAM: CHEST  2 VIEW  COMPARISON:  10/19/2012  FINDINGS: The cardiac shadow is stable but mildly enlarged. A left chest wall port is again seen and stable. Mild bibasilar atelectatic changes are seen. No focal confluent infiltrate is seen. Mild interstitial changes are noted throughout both lungs. No acute bony abnormality is noted.  IMPRESSION: Bibasilar atelectatic changes superimposed over chronic interstitial change.   Electronically Signed   By: Alcide Clever M.D.   On: 11/13/2012 11:41   Ct Chest W Contrast  11/17/2012   CLINICAL DATA:  Right-sided chest pain. Elevated white count.  EXAM: CT CHEST WITH CONTRAST  TECHNIQUE: Multidetector CT imaging of the chest was performed during intravenous contrast administration.  CONTRAST:  80mL OMNIPAQUE IOHEXOL 300 MG/ML  SOLN  COMPARISON:  11/13/2012  FINDINGS: Left Port-A-Cath remains in place, unchanged. There is mediastinal adenopathy, stable since prior study. Cardiomegaly, stable. Aorta is normal caliber.  Area of consolidation and air bronchograms medially in the right lower lobe, best seen on image 28. Cannot exclude pneumonia. Bilateral lower lobe airspace opacities. The left basilar opacity has improved since prior study. Findings on the right are similar, likely atelectasis. Trace bilateral  pleural effusions.  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 Sickle cell bony changes noted.  IMPRESSION: Airspace disease within the medial right lower lobe concerning for pneumonia. Improving opacity in the left lung base. Similar opacity in the right lung base, likely atelectasis.  Trace bilateral effusions.  Stable cardiomegaly and mildly prominent mediastinal lymph nodes.   Electronically Signed   By: Charlett Nose M.D.   On: 11/17/2012 17:02   Ct Angio Chest Pe W/cm &/or Wo Cm  11/13/2012   CLINICAL DATA:  Sickle cell disease. The left mid chest pain.  EXAM: CT ANGIOGRAPHY CHEST WITH CONTRAST  TECHNIQUE: Multidetector CT imaging of the chest was performed using the  standard protocol during bolus administration of intravenous contrast. Multiplanar CT image reconstructions including MIPs were obtained to evaluate the vascular anatomy.  CONTRAST:  OMNIPAQUE IOHEXOL 350 MG/ML SOLN  COMPARISON:  CT chest 08/22/2012. Plain films of the chest earlier this same day.  FINDINGS: No pulmonary embolus is identified. There is cardiomegaly. Small left pleural effusion is identified. Multiple small mediastinal lymph nodes are again seen, not notably changed. Lungs demonstrate basilar airspace disease, worse on the left, where changes are worrisome for pneumonia. Right basilar airspace disease is more in keeping with atelectasis.  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 Bony changes of sickle cell disease appear stable.  Review of the MIP images confirms the above findings.  IMPRESSION: Small left effusion and basilar airspace disease worrisome for pneumonia.  Negative for pulmonary embolus.  Cardiomegaly.  No change in small mediastinal lymph nodes, likely reactive.   Electronically Signed   By: Drusilla Kanner M.D.   On: 11/13/2012 15:23    Labs: Basic Metabolic  Panel:  Recent Labs Lab 11/29/12 0256 11/29/12 1402  NA 137 137  K 3.5 3.5  CL 105 106  CO2 23 21  GLUCOSE 105* 118*  BUN 9 6  CREATININE 0.70 0.56  CALCIUM 9.4 9.1   Liver Function Tests:  Recent Labs Lab 11/29/12 1402  AST 23  ALT 13  ALKPHOS 90  BILITOT 4.0*  PROT 7.7  ALBUMIN 3.8   No results found for this basename: LIPASE, AMYLASE,  in the last 168 hours No results found for this basename: AMMONIA,  in the last 168 hours CBC:  Recent Labs Lab 11/29/12 0256 12/04/12 1200  WBC 18.4* 15.6*  NEUTROABS  --  9.6*  HGB 7.5* 7.4*  HCT 21.8* 22.0*  MCV 89.7 90.9  PLT 771* 629*    Time coordinating discharge: 42 minutes  Signed:  Mailin Coglianese A.  12/04/2012, 5:31 PM

## 2012-12-04 NOTE — H&P (Signed)
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 He rates the pain as 7/10, non radiating and aching  in legs and sharp in ribs. He took 8 mg of dilaudid orally as instructed to do when pain increases but there was no decrease in the pain. Pt has had the "sniffles" but no cough or fever. He denies fatigue.  He has been compliant with Hydroxyurea and folic acid.   Review of Systems:  Constitutional: No weight loss, night sweats, Fevers, chills, fatigue.  HEENT: No headaches, dizziness, seizures, vision changes, difficulty swallowing,Tooth/dental problems,Sore throat, No sneezing, itching, ear ache, nasal congestion, post nasal drip,  Cardio-vascular: No chest pain, Orthopnea, PND, swelling in lower extremities, anasarca, dizziness, palpitations  GI: No heartburn, indigestion, abdominal pain, nausea, vomiting, diarrhea, change in bowel habits, loss of appetite  Resp: No shortness of breath with exertion or at rest. No excess mucus, no productive cough, No non-productive cough, No coughing up of blood.No change in color of mucus.No wheezing.No chest wall deformity  Skin: no rash or lesions.  GU: no dysuria, change in color of urine, no urgency or frequency. No flank pain.  Psych: No change in mood or affect. No depression or anxiety. No memory loss.    Past Medical History  Diagnosis Date  . Sickle cell anemia   . Blood transfusion   . 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 Hypokalemia   . Mood disorder   . Pulmonary embolism   . Avascular necrosis   . Leukocytosis     Chronic  . Thrombocytosis     Chronic  . Hypertension   . C. difficile colitis    Past  Surgical History  Procedure Laterality Date  . Right hip replacement      08/2006  . Cholecystectomy      01/2008  . Porta cath placement    . Porta cath removal    . Umbilical hernia repair      01/2008  . Excision of left periauricular cyst      10/2009  . Excision of right ear lobe cyst with primary closur      11/2007  . Portacath placement  01/05/2012    Procedure: INSERTION PORT-A-CATH;  Surgeon: Adolph Pollack, MD;  Location[facility] OR;  Service: General;  Laterality: N/A;  ultrasound guiced port a cath insertion with fluoroscopy   Social History:  reports that he quit smoking about 2 years ago. His smoking use included Cigarettes. He smoked 0.00 packs per day for 13 years. He has never used smokeless tobacco. He reports that he does not drink alcohol or use illicit drugs.  Allergies  Allergen Reactions  . Morphine And Related Hives and Rash    Pt states he can take the pill form, but not IV and he is able to tolerate Dilaudid with no reactions.    Family History  Problem Relation Age of Onset  . Sickle cell anemia Mother   . Sickle cell anemia Father   . Sickle cell trait Brother     Prior to Admission medications   Medication Sig Start Date End Date Taking? Authorizing Provider  celecoxib (CELEBREX)  200 MG capsule Take 1 capsule (200 mg total) by mouth 2 (two) times daily. 11/19/12  Yes Altha Harm, MD  folic acid (FOLVITE) 1 MG tablet Take 1 tablet (1 mg total) by mouth every morning. 06/05/12  Yes Altha Harm, MD  HYDROmorphone (DILAUDID) 4 MG tablet Take 1 tablet (4 mg total) by mouth every 4 (four) hours as needed for pain. 11/29/12  Yes Grayce Sessions, NP  hydroxyurea (HYDREA) 500 MG capsule Take 2 capsules (1,000 mg total) by mouth daily. May take with food to minimize GI side effects. 11/19/12  Yes Altha Harm, MD  morphine (MS CONTIN) 15 MG 12 hr tablet Take 1 tablet (15 mg total) by mouth 2 (two) times daily. 11/21/12  Yes Altha Harm, MD  potassium chloride SA (K-DUR,KLOR-CON) 20 MEQ tablet Take 1 tablet (20 mEq total) by mouth every morning. 07/31/12  Yes Altha Harm, MD  Rivaroxaban (XARELTO) 20 MG TABS tablet Take 1 tablet (20 mg total) by mouth every morning. 11/21/12  Yes Altha Harm, MD  zolpidem (AMBIEN) 10 MG tablet Take 1 tablet (10 mg total) by mouth at bedtime as needed for sleep. 11/28/12  Yes Grayce Sessions, NP   Physical Exam: Filed Vitals:   12/04/12 1119 12/04/12 1211  BP: 129/83   Pulse: 79   Temp: 98 F (36.7 C)   TempSrc: Oral   Resp: 18 17  SpO2: 100% 96%   BP 129/83  Pulse 79  Temp(Src) 98 F (36.7 C) (Oral)  Resp 17  SpO2 96%  General Appearance:    Alert, cooperative, no distress, appears stated age  Head:    Normocephalic, without obvious abnormality, atraumatic  Eyes:    PERRL, conjunctiva/corneas clear, EOM's intact, fundi    benign, both eyes anicteric.     Throat:   Lips, mucosa, and tongue normal; teeth and gums normal  Neck:   Supple, symmetrical, trachea midline, no adenopathy;       thyroid:  No enlargement/tenderness/nodules; no carotid   bruit or JVD  Back:     Symmetric, no curvature, ROM normal, no CVA tenderness  Lungs:     Clear to auscultation bilaterally, respirations unlabored  Chest wall:    No tenderness or deformity  Heart:    Regular rate and rhythm, S1 and S2 normal, no murmur, rub   or gallop  Abdomen:     Soft, non-tender, bowel sounds active all four quadrants,    no masses, no organomegaly  Extremities:   Extremities normal, atraumatic, no cyanosis or edema  Pulses:   2+ and symmetric all extremities  Skin:   Skin color, texture, turgor normal, no rashes or lesions  Lymph nodes:   Cervical, supraclavicular, and axillary nodes normal  Neurologic:   CNII-XII intact. Normal strength, sensation and reflexes      throughout   Labs on Admission:   Basic Metabolic Panel:  Recent Labs Lab 11/29/12 0256 11/29/12 1402  NA 137  137  K 3.5 3.5  CL 105 106  CO2 23 21  GLUCOSE 105* 118*  BUN 9 6  CREATININE 0.70 0.56  CALCIUM 9.4 9.1   Liver Function Tests:  Recent Labs Lab 11/29/12 1402  AST 23  ALT 13  ALKPHOS 90  BILITOT 4.0*  PROT 7.7  ALBUMIN 3.8   CBC:  Recent Labs Lab 11/29/12 0256  WBC 18.4*  HGB 7.5*  HCT 21.8*  MCV 89.7  PLT 771*  Assessment/Plan: Active Problems: 1. Hb SS with crisis: Pt with early crisis. Will treat initially with bolus dilaudid and then start PCA when available. Toradol and heat as adjunctive treatment. IVF. Will re-evaluate in 4 hours and make adjustments and disposition. Continue MS Contin. Check CBC with diff.   Time spend: 30 minutes Code Status: Full Code Family Communication: N/A Disposition Plan: Home at time of discharge  [name],[name], MD  Pager [phone]  If 7PM-7AM, please contact night-coverage [email or web address] Password [email or web address] 12/04/2012, 12:40 PM

## 2012-12-05 IMAGING — CR DG CHEST 1V PORT
1 series · 1 of 1 positions shown · non-contrast
Comparison: 12/20/2009 and earlier.

CLINICAL DATA: 29-year-old male sickle cell crisis, pain, mild
cough.

PORTABLE CHEST - 1 VIEW

[AP]
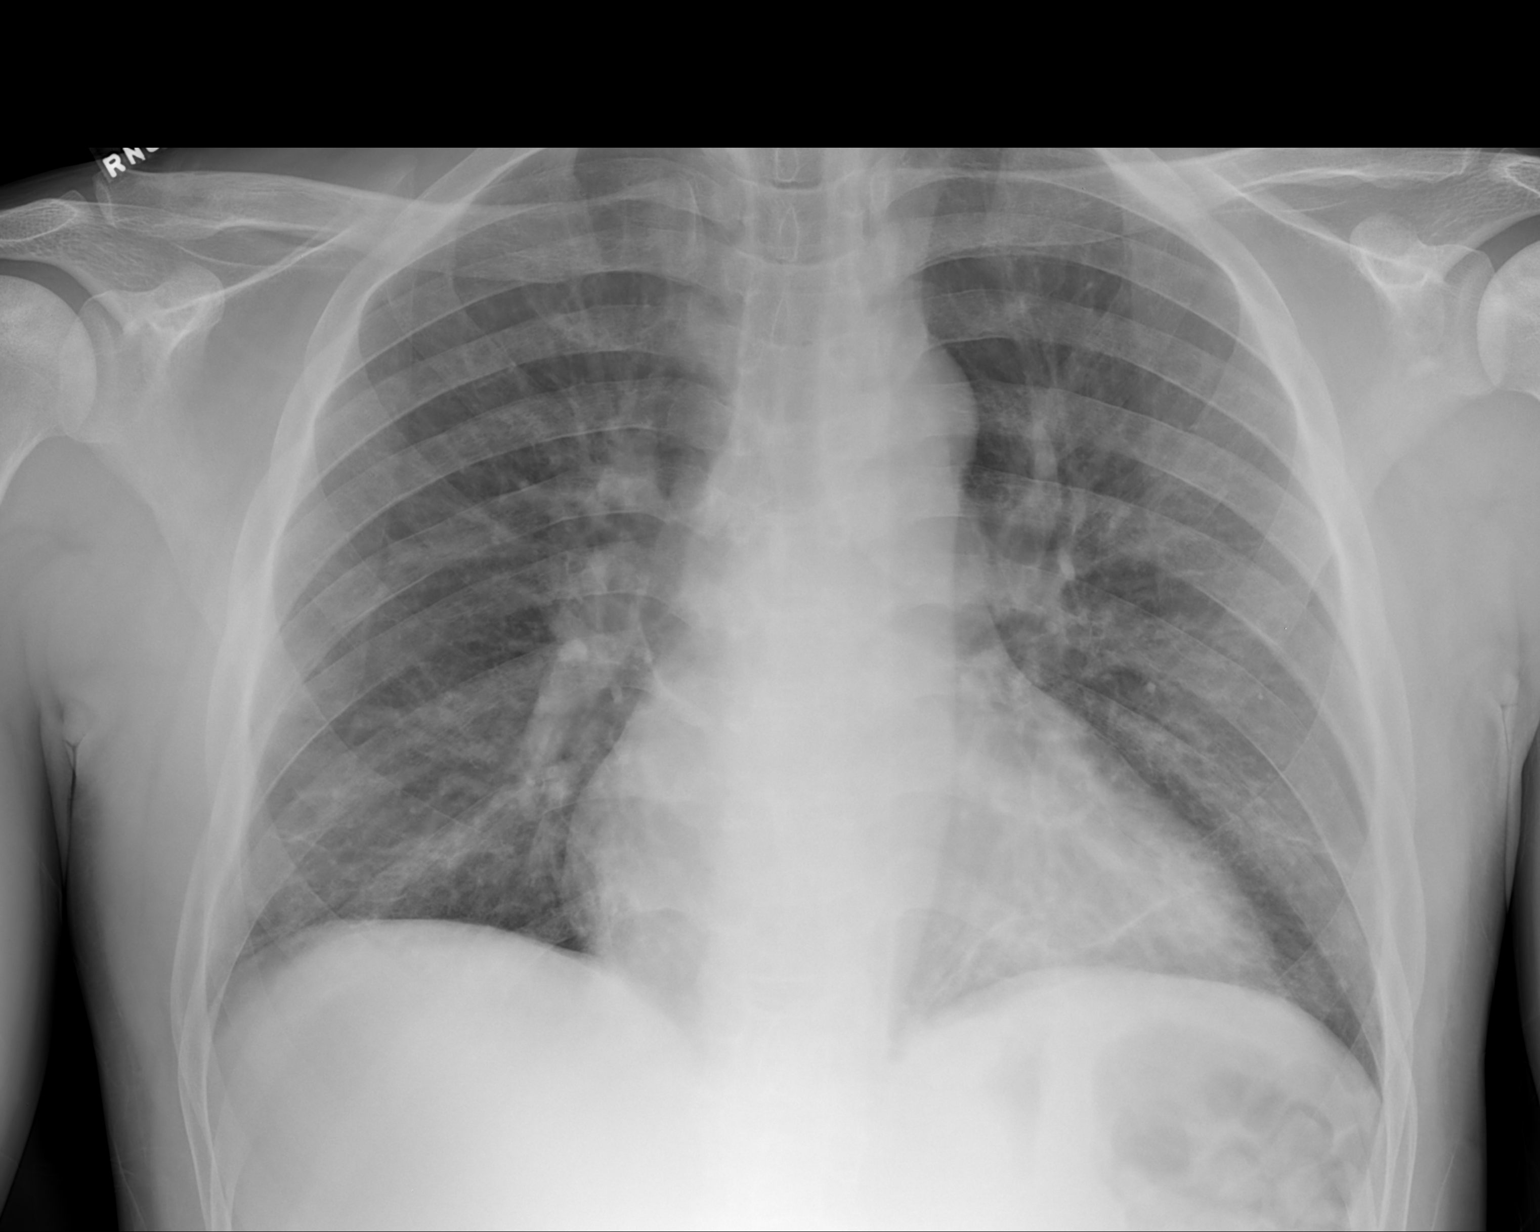

[1 of 1 positions shown; findings below may reference images not displayed]

FINDINGS: Portable semi upright AP view 7911 hours.  Lower lung
volumes. Stable cardiomegaly and mediastinal contours.  Increased
pulmonary vascularity mildly progressed.  No pneumothorax or
confluent pulmonary opacity.  No definite effusion. Visualized
tracheal air column is within normal limits.
IMPRESSION: Lower lung volumes and increased pulmonary vascular congestion.  No
focal pneumonia.

## 2012-12-07 NOTE — Discharge Summary (Signed)
Pt seen and examined and discussed with NP [name]. Agree with D/C home.

## 2012-12-07 NOTE — Telephone Encounter (Signed)
Close encounter 

## 2012-12-07 NOTE — H&P (Signed)
Pt seen and examined and discussed with NP Opal Dinning [name]. Agree with assessment and plan. 

## 2012-12-09 ENCOUNTER — Encounter (HOSPITAL_COMMUNITY): Payer: Self-pay | Admitting: Emergency Medicine

## 2012-12-09 ENCOUNTER — Emergency Department (HOSPITAL_COMMUNITY)
Admission: EM | Admit: 2012-12-09 | Discharge: 2012-12-09 | Disposition: A | Discharge status: Home / Self Care | Payer: Medicare Other | Attending: Emergency Medicine | Admitting: Emergency Medicine

## 2012-12-09 DIAGNOSIS — Z7901 Long term (current) use of anticoagulants: Secondary | ICD-10-CM | POA: Diagnosis not present

## 2012-12-09 DIAGNOSIS — D5700 Hb-SS disease with crisis, unspecified: Secondary | ICD-10-CM | POA: Insufficient documentation

## 2012-12-09 DIAGNOSIS — Z79899 Other long term (current) drug therapy: Secondary | ICD-10-CM | POA: Diagnosis not present

## 2012-12-09 DIAGNOSIS — E876 Hypokalemia: Secondary | ICD-10-CM | POA: Diagnosis not present

## 2012-12-09 DIAGNOSIS — I10 Essential (primary) hypertension: Secondary | ICD-10-CM | POA: Insufficient documentation

## 2012-12-09 DIAGNOSIS — Z8739 Personal history of other diseases of the musculoskeletal system and connective tissue: Secondary | ICD-10-CM | POA: Insufficient documentation

## 2012-12-09 DIAGNOSIS — Z8619 Personal history of other infectious and parasitic diseases: Secondary | ICD-10-CM | POA: Insufficient documentation

## 2012-12-09 DIAGNOSIS — Z8659 Personal history of other mental and behavioral disorders: Secondary | ICD-10-CM | POA: Insufficient documentation

## 2012-12-09 DIAGNOSIS — Z791 Long term (current) use of non-steroidal anti-inflammatories (NSAID): Secondary | ICD-10-CM | POA: Diagnosis not present

## 2012-12-09 DIAGNOSIS — Z86711 Personal history of pulmonary embolism: Secondary | ICD-10-CM | POA: Diagnosis not present

## 2012-12-09 DIAGNOSIS — Z87891 Personal history of nicotine dependence: Secondary | ICD-10-CM | POA: Insufficient documentation

## 2012-12-09 DIAGNOSIS — D571 Sickle-cell disease without crisis: Secondary | ICD-10-CM | POA: Diagnosis not present

## 2012-12-09 LAB — CBC WITH DIFFERENTIAL/PLATELET
Basophils Absolute: 0.1 10*3/uL (ref 0.0–0.1)
Basophils Relative: 1 % (ref 0–1)
Eosinophils Absolute: 1.2 10*3/uL — ABNORMAL HIGH (ref 0.0–0.7)
Eosinophils Relative: 7 % — ABNORMAL HIGH (ref 0–5)
Lymphocytes Relative: 12 % (ref 12–46)
Lymphs Abs: 2.1 10*3/uL (ref 0.7–4.0)
MCHC: 34 g/dL (ref 30.0–36.0)
Neutrophils Relative %: 69 % (ref 43–77)
Platelets: 591 10*3/uL — ABNORMAL HIGH (ref 150–400)
RBC: 2.66 MIL/uL — ABNORMAL LOW (ref 4.22–5.81)
WBC: 16.6 10*3/uL — ABNORMAL HIGH (ref 4.0–10.5)

## 2012-12-09 LAB — COMPREHENSIVE METABOLIC PANEL
ALT: 19 U/L (ref 0–53)
AST: 30 U/L (ref 0–37)
Alkaline Phosphatase: 100 U/L (ref 39–117)
CO2: 24 mEq/L (ref 19–32)
Calcium: 9 mg/dL (ref 8.4–10.5)
Creatinine, Ser: 0.57 mg/dL (ref 0.50–1.35)
GFR calc Af Amer: >90 mL/min (ref >90)
Glucose, Bld: 103 mg/dL — ABNORMAL HIGH (ref 70–99)
Potassium: 3.8 mEq/L (ref 3.5–5.1)
Sodium: 136 mEq/L (ref 135–145)
Total Protein: 7.6 g/dL (ref 6.0–8.3)

## 2012-12-09 LAB — RETICULOCYTES: RBC.: 2.66 MIL/uL — ABNORMAL LOW (ref 4.22–5.81)

## 2012-12-09 MED ORDER — SODIUM CHLORIDE 0.9 % IV BOLUS (SEPSIS)
1000.0000 mL | Freq: Once | INTRAVENOUS | Status: AC
  Administered 2012-12-09: 1000 mL via INTRAVENOUS

## 2012-12-09 MED ORDER — HEPARIN SOD (PORK) LOCK FLUSH 100 UNIT/ML IV SOLN
500.0000 [IU] | Freq: Once | INTRAVENOUS | Status: AC
  Administered 2012-12-09: 500 [IU]
  Filled 2012-12-09: qty 5

## 2012-12-09 MED ORDER — HYDROMORPHONE HCL PF 1 MG/ML IJ SOLN
2.0000 mg | INTRAMUSCULAR | Status: DC | PRN
  Administered 2012-12-09 (×3): 2 mg via INTRAVENOUS
  Filled 2012-12-09 (×4): qty 2

## 2012-12-09 MED ORDER — DIPHENHYDRAMINE HCL 50 MG/ML IJ SOLN
25.0000 mg | INTRAMUSCULAR | Status: DC | PRN
  Administered 2012-12-09: 25 mg via INTRAVENOUS
  Filled 2012-12-09: qty 1

## 2012-12-09 MED ORDER — SODIUM CHLORIDE 0.9 % IV SOLN
INTRAVENOUS | Status: DC
  Administered 2012-12-09: 10:00:00 via INTRAVENOUS

## 2012-12-09 NOTE — ED Provider Notes (Signed)
CSN: [id number]     Arrival date & time 12/09/12  0807 History   First MD Initiated Contact with Patient 12/09/12 [phone]     Chief Complaint  Patient presents with  . Sickle Cell Pain Crisis   (Consider location/radiation/quality/duration/timing/severity/associated sxs/prior Treatment) Patient is a 32 y.o. male presenting with sickle cell pain. The history is provided by the patient.  Sickle Cell Pain Crisis Location:  Back Severity:  Moderate Onset quality:  Sudden Duration:  4 hours Similar to previous crisis episodes: yes   Timing:  Constant Progression:  Worsening Chronicity:  Recurrent Sickle cell genotype:  [place] Frequency of attacks:  Often History of pulmonary emboli: yes   Context: not dehydration and not infection   Relieved by:  Nothing Worsened by:  Nothing tried Ineffective treatments:  Prescription drugs Associated symptoms: no chest pain, no cough, no fever, no priapism, no shortness of breath, no swelling of legs, no vomiting and no wheezing   Risk factors: frequent admissions for pain     Past Medical History  Diagnosis Date  . Sickle cell anemia   . Blood transfusion   . 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 Hypokalemia   . Mood disorder   . Pulmonary embolism   . Avascular necrosis   . Leukocytosis     Chronic  . Thrombocytosis     Chronic  . Hypertension   . C. difficile colitis    Past Surgical History  Procedure Laterality Date  . Right hip replacement      08/2006  . Cholecystectomy      01/2008  . Porta cath placement    . Porta cath removal    . Umbilical hernia repair      01/2008  . Excision of left periauricular cyst      10/2009  . Excision of right ear lobe cyst with primary closur      11/2007  . Portacath placement  01/05/2012    Procedure: INSERTION PORT-A-CATH;  Surgeon: Adolph Pollack, MD;  Location: [facility] OR;  Service: General;  Laterality: N/A;  ultrasound guiced port a cath insertion with fluoroscopy    Family History  Problem Relation Age of Onset  . Sickle cell anemia Mother   . Sickle cell anemia Father   . Sickle cell trait Brother    History  Substance Use Topics  . Smoking status: Former Smoker -- 13 years    Types: Cigarettes    Quit date: 07/08/2010  . Smokeless tobacco: Never Used  . Alcohol Use: No    Review of Systems  Constitutional: Negative for fever.  Respiratory: Negative for cough, shortness of breath and wheezing.   Cardiovascular: Negative for chest pain.  Gastrointestinal: Negative for vomiting.  All other systems reviewed and are negative.    Allergies  Morphine and related  Home Medications   Current Outpatient Rx  Name  Route  Sig  Dispense  Refill  . celecoxib (CELEBREX) 200 MG capsule   Oral   Take 1 capsule (200 mg total) by mouth 2 (two) times daily.   60 capsule   1   . folic acid (FOLVITE) 1 MG tablet   Oral   Take 1 tablet (1 mg total) by mouth every morning.   30 tablet   11   . HYDROmorphone (DILAUDID) 4 MG tablet      Take 8 mg every 4 hours for next 24 hours then resume 4 mg every 4 hours as  needed for pain.   90 tablet   0   . hydroxyurea (HYDREA) 500 MG capsule   Oral   Take 2 capsules (1,000 mg total) by mouth daily. May take with food to minimize GI side effects.   60 capsule   1   . morphine (MS CONTIN) 15 MG 12 hr tablet   Oral   Take 1 tablet (15 mg total) by mouth 2 (two) times daily.   60 tablet   0   . potassium chloride SA (K-DUR,KLOR-CON) 20 MEQ tablet   Oral   Take 1 tablet (20 mEq total) by mouth every morning.   30 tablet   3   . Rivaroxaban (XARELTO) 20 MG TABS tablet   Oral   Take 1 tablet (20 mg total) by mouth every morning.   30 tablet   2   . zolpidem (AMBIEN) 10 MG tablet   Oral   Take 1 tablet (10 mg total) by mouth at bedtime as needed for sleep.   30 tablet   2    BP 112/70  Pulse 86  Temp(Src) 98.4 F (36.9 C) (Oral)  Resp 16  SpO2 100% Physical Exam  Nursing note  and vitals reviewed. Constitutional: He is oriented to person, place, and time. He appears well-developed and well-nourished.  Non-toxic appearance. No distress.  HENT:  Head: Normocephalic and atraumatic.  Eyes: Conjunctivae, EOM and lids are normal. Pupils are equal, round, and reactive to light.  Neck: Normal range of motion. Neck supple. No tracheal deviation present. No mass present.  Cardiovascular: Normal rate, regular rhythm and normal heart sounds.  Exam reveals no gallop.   No murmur heard. Pulmonary/Chest: Effort normal and breath sounds normal. No stridor. No respiratory distress. He has no decreased breath sounds. He has no wheezes. He has no rhonchi. He has no rales.  Abdominal: Soft. Normal appearance and bowel sounds are normal. He exhibits no distension. There is no tenderness. There is no rebound and no CVA tenderness.  Musculoskeletal: Normal range of motion. He exhibits no edema and no tenderness.  Neurological: He is alert and oriented to person, place, and time. He has normal strength. No cranial nerve deficit or sensory deficit. GCS eye subscore is 4. GCS verbal subscore is 5. GCS motor subscore is 6.  Skin: Skin is warm and dry. No abrasion and no rash noted.  Psychiatric: He has a normal mood and affect. His speech is normal and behavior is normal.    ED Course  Procedures (including critical care time) Labs Review Labs Reviewed  CBC WITH DIFFERENTIAL  COMPREHENSIVE METABOLIC PANEL  RETICULOCYTES   Imaging Review No results found.  EKG Interpretation   None       MDM  No diagnosis found. Patient given IV fluids and IV hydromorphone with Benadryl here--pain is controlled at this time and he feels safe for discharge. No concern for acute chest syndrome, CVA, or aplastic crisis. Patient stable for discharge    Toy Baker, MD 12/09/12 1043

## 2012-12-09 NOTE — ED Notes (Signed)
Pt from home reports SCC since 5am. Pt states he is having pain in his ribs and lower back. Pt is A&O and in NAD

## 2012-12-10 ENCOUNTER — Telehealth (HOSPITAL_COMMUNITY): Payer: Self-pay | Admitting: Hematology

## 2012-12-10 ENCOUNTER — Non-Acute Institutional Stay (HOSPITAL_COMMUNITY)
Admission: AD | Admit: 2012-12-10 | Discharge: 2012-12-10 | Disposition: A | Discharge status: Home / Self Care | Payer: Medicare Other | Attending: Internal Medicine | Admitting: Internal Medicine

## 2012-12-10 ENCOUNTER — Encounter (HOSPITAL_COMMUNITY): Payer: Self-pay

## 2012-12-10 ENCOUNTER — Telehealth: Payer: Self-pay | Admitting: Hematology

## 2012-12-10 DIAGNOSIS — Z86711 Personal history of pulmonary embolism: Secondary | ICD-10-CM | POA: Insufficient documentation

## 2012-12-10 DIAGNOSIS — Z86718 Personal history of other venous thrombosis and embolism: Secondary | ICD-10-CM | POA: Insufficient documentation

## 2012-12-10 DIAGNOSIS — Z79899 Other long term (current) drug therapy: Secondary | ICD-10-CM | POA: Insufficient documentation

## 2012-12-10 DIAGNOSIS — F39 Unspecified mood [affective] disorder: Secondary | ICD-10-CM

## 2012-12-10 DIAGNOSIS — Z87891 Personal history of nicotine dependence: Secondary | ICD-10-CM | POA: Diagnosis not present

## 2012-12-10 DIAGNOSIS — D5700 Hb-SS disease with crisis, unspecified: Secondary | ICD-10-CM

## 2012-12-10 DIAGNOSIS — Z7901 Long term (current) use of anticoagulants: Secondary | ICD-10-CM | POA: Diagnosis not present

## 2012-12-10 DIAGNOSIS — D571 Sickle-cell disease without crisis: Secondary | ICD-10-CM | POA: Diagnosis not present

## 2012-12-10 DIAGNOSIS — I10 Essential (primary) hypertension: Secondary | ICD-10-CM | POA: Diagnosis not present

## 2012-12-10 LAB — CBC WITH DIFFERENTIAL/PLATELET
Basophils Absolute: 0.1 10*3/uL (ref 0.0–0.1)
Basophils Relative: 1 % (ref 0–1)
Eosinophils Absolute: 0.9 10*3/uL — ABNORMAL HIGH (ref 0.0–0.7)
Eosinophils Relative: 6 % — ABNORMAL HIGH (ref 0–5)
Hemoglobin: 8.4 g/dL — ABNORMAL LOW (ref 13.0–17.0)
Lymphocytes Relative: 13 % (ref 12–46)
MCH: 30.8 pg (ref 26.0–34.0)
MCHC: 33.9 g/dL (ref 30.0–36.0)
MCV: 90.8 fL (ref 78.0–100.0)
Monocytes Relative: 13 % — ABNORMAL HIGH (ref 3–12)
Platelets: 561 10*3/uL — ABNORMAL HIGH (ref 150–400)
RDW: 18.8 % — ABNORMAL HIGH (ref 11.5–15.5)
WBC: 15.5 10*3/uL — ABNORMAL HIGH (ref 4.0–10.5)

## 2012-12-10 LAB — RETICULOCYTES
RBC.: 2.73 MIL/uL — ABNORMAL LOW (ref 4.22–5.81)
Retic Count, Absolute: 223.9 10*3/uL — ABNORMAL HIGH (ref 19.0–186.0)

## 2012-12-10 LAB — COMPREHENSIVE METABOLIC PANEL
ALT: 21 U/L (ref 0–53)
AST: 36 U/L (ref 0–37)
Albumin: 4.1 g/dL (ref 3.5–5.2)
Calcium: 9.1 mg/dL (ref 8.4–10.5)
Chloride: 105 mEq/L (ref 96–112)
Sodium: 135 mEq/L (ref 135–145)
Total Protein: 7.8 g/dL (ref 6.0–8.3)

## 2012-12-10 MED ORDER — HEPARIN SOD (PORK) LOCK FLUSH 100 UNIT/ML IV SOLN
500.0000 [IU] | INTRAVENOUS | Status: AC | PRN
  Administered 2012-12-10: 500 [IU]
  Filled 2012-12-10: qty 5

## 2012-12-10 MED ORDER — HYDROMORPHONE 0.3 MG/ML IV SOLN
INTRAVENOUS | Status: DC
  Administered 2012-12-10: 10.39 mg via INTRAVENOUS
  Administered 2012-12-10 (×3): via INTRAVENOUS
  Administered 2012-12-10: 14.51 mg via INTRAVENOUS
  Administered 2012-12-10: 15:00:00 via INTRAVENOUS
  Filled 2012-12-10 (×4): qty 25

## 2012-12-10 MED ORDER — HYDROMORPHONE HCL PF 2 MG/ML IJ SOLN
4.0000 mg | INTRAMUSCULAR | Status: DC
  Administered 2012-12-10: 4 mg via INTRAVENOUS
  Filled 2012-12-10: qty 2

## 2012-12-10 MED ORDER — ONDANSETRON HCL 4 MG/2ML IJ SOLN: 4.0000 mg | INTRAMUSCULAR | Status: DC | PRN

## 2012-12-10 MED ORDER — DIPHENHYDRAMINE HCL 25 MG PO CAPS
25.0000 mg | ORAL_CAPSULE | ORAL | Status: DC | PRN
  Administered 2012-12-10 (×2): 25 mg via ORAL
  Filled 2012-12-10 (×2): qty 1

## 2012-12-10 MED ORDER — ONDANSETRON HCL 4 MG PO TABS: 4.0000 mg | ORAL_TABLET | ORAL | Status: DC | PRN

## 2012-12-10 MED ORDER — SODIUM CHLORIDE 0.9 % IJ SOLN
10.0000 mL | INTRAMUSCULAR | Status: AC | PRN
  Administered 2012-12-10: 10 mL

## 2012-12-10 MED ORDER — DEXTROSE-NACL 5-0.45 % IV SOLN
INTRAVENOUS | Status: DC
  Administered 2012-12-10: 09:00:00 via INTRAVENOUS

## 2012-12-10 MED ORDER — SODIUM CHLORIDE 0.9 % IV SOLN
25.0000 mg | INTRAVENOUS | Status: DC | PRN
  Filled 2012-12-10: qty 0.5

## 2012-12-10 MED ORDER — KETOROLAC TROMETHAMINE 30 MG/ML IJ SOLN
30.0000 mg | Freq: Four times a day (QID) | INTRAMUSCULAR | Status: DC
  Administered 2012-12-10 (×2): 30 mg via INTRAVENOUS
  Filled 2012-12-10 (×2): qty 1

## 2012-12-10 MED ORDER — FOLIC ACID 1 MG PO TABS: 1.0000 mg | ORAL_TABLET | Freq: Every day | ORAL | Status: DC

## 2012-12-10 NOTE — Telephone Encounter (Signed)
Called patient back and advised per Dr.[name], it is ok for him to come to the day hospital.  Patient verbalizes understanding.

## 2012-12-10 NOTE — Progress Notes (Signed)
Pt discharged to home; discharge orders explained, given, and signed; all questions answered; pt alert and oriented; ambulatory; pt states pain is 4/10 and states he will be able to manage at home

## 2012-12-10 NOTE — Discharge Summary (Signed)
Physician Discharge Summary  Patient ID: [name] MRN: [id number] DOB/AGE: 07/27/1980 32 y.o.  Admit date: 12/10/2012 Discharge date: 12/10/2012  Admission Diagnoses:  Discharge Diagnoses:  Active Problems:   * No active hospital problems. *   Discharged Condition: good  Hospital Course: [facility] He was placed on Dilaudid PCA, Toradol, IV fluids, Benadryl as well as Zofran. At the end of his stay his pain level went from 10 to 4/10. He feels much better and was asked to resume his home medications. He is to followup at the [facility] cell clinic as previously scheduled.  Consults: None  Significant Diagnostic Studies: labs: CBC and CMP were checked today. No evidence of active him on his  Treatments: IV hydration and analgesia: Dilaudid  Discharge Exam: Blood pressure 123/65, pulse 81, temperature 98.4 F (36.9 C), temperature source Oral, resp. rate 16, SpO2 98.00%. General appearance: alert, cooperative and no distress Eyes: conjunctivae/corneas clear. PERRL, EOM's intact. Fundi benign. Neck: no adenopathy, no carotid bruit, no JVD, supple, symmetrical, trachea midline and thyroid not enlarged, symmetric, no tenderness/mass/nodules Back: symmetric, no curvature. ROM normal. No CVA tenderness. Resp: clear to auscultation bilaterally Cardio: regular rate and rhythm, S1, S2 normal, no murmur, click, rub or gallop GI: soft, non-tender; bowel sounds normal; no masses,  no organomegaly Extremities: extremities normal, atraumatic, no cyanosis or edema Skin: Skin color, texture, turgor normal. No rashes or lesions Neurologic: Grossly normal  Disposition: 01-Home or Self Care   Future Appointments Provider Department Dept Phone   12/20/2012 1:30 PM Altha Harm, MD [facility] [phone]       Medication List         celecoxib 200 MG capsule  Commonly known as:  CELEBREX  Take 1 capsule (200 mg  total) by mouth 2 (two) times daily.     folic acid 1 MG tablet  Commonly known as:  FOLVITE  Take 1 tablet (1 mg total) by mouth every morning.     HYDROmorphone 4 MG tablet  Commonly known as:  DILAUDID  Take 8 mg every 4 hours for next 24 hours then resume 4 mg every 4 hours as needed for pain.     hydroxyurea 500 MG capsule  Commonly known as:  HYDREA  Take 2 capsules (1,000 mg total) by mouth daily. May take with food to minimize GI side effects.     morphine 15 MG 12 hr tablet  Commonly known as:  MS CONTIN  Take 1 tablet (15 mg total) by mouth 2 (two) times daily.     potassium chloride SA 20 MEQ tablet  Commonly known as:  K-DUR,KLOR-CON  Take 1 tablet (20 mEq total) by mouth every morning.     Rivaroxaban 20 MG Tabs tablet  Commonly known as:  XARELTO  Take 1 tablet (20 mg total) by mouth every morning.     zolpidem 10 MG tablet  Commonly known as:  AMBIEN  Take 1 tablet (10 mg total) by mouth at bedtime as needed for sleep.         SignedLonia Blood 12/10/2012, 6:47 PM

## 2012-12-10 NOTE — Telephone Encounter (Signed)
Patient called C/O pain to ribs, back and legs that is now a 7-/10.  Patient denies chest or abdominal pain, denies nausea or vomiting.  I explained I would notify the physician and give him a call back.  Patient verbalizes understanding.

## 2012-12-10 NOTE — H&P (Signed)
[name] is an 32 y.o. male.   Chief Complaint: Pain in the back or legs HPI: A 32 year old gentleman well-known to our service with sickle cell painful crisis who came to the [facility] due to 3 days of severe pain involving his lower back and bilateral legs. He has taken his home medications including the Dilaudid and MS Contin but not getting better. He was in the emergency room yesterday where he was given IV pain medication and discharged home but still not feeling better so he decided to come in today. He denied any cough, no shortness of breath, no nausea vomiting or diarrhea. He denied fever or chills. He has history of pulmonary embolism and is taking his medications as prescribed.  Past Medical History  Diagnosis Date  . Sickle cell anemia   . Blood transfusion   . 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 Hypokalemia   . Mood disorder   . Pulmonary embolism   . Avascular necrosis   . Leukocytosis     Chronic  . Thrombocytosis     Chronic  . Hypertension   . C. difficile colitis     Past Surgical History  Procedure Laterality Date  . Right hip replacement      08/2006  . Cholecystectomy      01/2008  . Porta cath placement    . Porta cath removal    . Umbilical hernia repair      01/2008  . Excision of left periauricular cyst      10/2009  . Excision of right ear lobe cyst with primary closur      11/2007  . Portacath placement  01/05/2012    Procedure: INSERTION PORT-A-CATH;  Surgeon: Adolph Pollack, MD;  Location: [facility] OR;  Service: General;  Laterality: N/A;  ultrasound guiced port a cath insertion with fluoroscopy    Family History  Problem Relation Age of Onset  . Sickle cell anemia Mother   . Sickle cell anemia Father   . Sickle cell trait Brother    Social History:  reports that he quit smoking about 2 years ago. His smoking use included Cigarettes. He smoked 0.00 packs per day for 13 years. He has never used  smokeless tobacco. He reports that he does not drink alcohol or use illicit drugs.  Allergies:  Allergies  Allergen Reactions  . Morphine And Related Hives and Rash    Pt states he can take the pill form, but not IV and he is able to tolerate Dilaudid with no reactions.    Medications Prior to Admission  Medication Sig Dispense Refill  . celecoxib (CELEBREX) 200 MG capsule Take 1 capsule (200 mg total) by mouth 2 (two) times daily.  60 capsule  1  . folic acid (FOLVITE) 1 MG tablet Take 1 tablet (1 mg total) by mouth every morning.  30 tablet  11  . HYDROmorphone (DILAUDID) 4 MG tablet Take 8 mg every 4 hours for next 24 hours then resume 4 mg every 4 hours as needed for pain.  90 tablet  0  . hydroxyurea (HYDREA) 500 MG capsule Take 2 capsules (1,000 mg total) by mouth daily. May take with food to minimize GI side effects.  60 capsule  1  . morphine (MS CONTIN) 15 MG 12 hr tablet Take 1 tablet (15 mg total) by mouth 2 (two) times daily.  60 tablet  0  . potassium chloride SA (K-DUR,KLOR-CON) 20 MEQ  tablet Take 1 tablet (20 mEq total) by mouth every morning.  30 tablet  3  . Rivaroxaban (XARELTO) 20 MG TABS tablet Take 1 tablet (20 mg total) by mouth every morning.  30 tablet  2  . zolpidem (AMBIEN) 10 MG tablet Take 1 tablet (10 mg total) by mouth at bedtime as needed for sleep.  30 tablet  2    Results for orders placed during the hospital encounter of 12/10/12 (from the past 48 hour(s))  CBC WITH DIFFERENTIAL     Status: Abnormal   Collection Time    12/10/12  8:47 AM      Result Value Range   WBC 15.5 (*) 4.0 - 10.5 K/uL   RBC 2.73 (*) 4.22 - 5.81 MIL/uL   Hemoglobin 8.4 (*) 13.0 - 17.0 g/dL   HCT 96.2 (*) 95.2 - 84.1 %   MCV 90.8  78.0 - 100.0 fL   MCH 30.8  26.0 - 34.0 pg   MCHC 33.9  30.0 - 36.0 g/dL   RDW 32.4 (*) 40.1 - 02.7 %   Platelets 561 (*) 150 - 400 K/uL   Neutrophils Relative % 68  43 - 77 %   Neutro Abs 10.5 (*) 1.7 - 7.7 K/uL   Lymphocytes Relative 13  12 - 46  %   Lymphs Abs 2.0  0.7 - 4.0 K/uL   Monocytes Relative 13 (*) 3 - 12 %   Monocytes Absolute 1.9 (*) 0.1 - 1.0 K/uL   Eosinophils Relative 6 (*) 0 - 5 %   Eosinophils Absolute 0.9 (*) 0.0 - 0.7 K/uL   Basophils Relative 1  0 - 1 %   Basophils Absolute 0.1  0.0 - 0.1 K/uL  RETICULOCYTES     Status: Abnormal   Collection Time    12/10/12  8:47 AM      Result Value Range   Retic Ct Pct 8.2 (*) 0.4 - 3.1 %   RBC. 2.73 (*) 4.22 - 5.81 MIL/uL   Retic Count, Manual 223.9 (*) 19.0 - 186.0 K/uL   No results found.  Review of Systems  Constitutional: Negative.   Eyes: Negative.   Respiratory: Negative.   Cardiovascular: Negative.   Gastrointestinal: Negative.   Genitourinary: Negative.   Musculoskeletal: Positive for back pain, joint pain, myalgias and neck pain. Negative for falls.  Skin: Negative.   Neurological: Negative.   Endo/Heme/Allergies: Negative.   Psychiatric/Behavioral: Negative.     Blood pressure 130/77, pulse 92, temperature 98.4 F (36.9 C), temperature source Oral, resp. rate 18, SpO2 100.00%. Physical Exam  Constitutional: He is oriented to person, place, and time. He appears well-developed and well-nourished.  HENT:  Head: Normocephalic and atraumatic.  Right Ear: External ear normal.  Left Ear: External ear normal.  Mouth/Throat: Oropharynx is clear and moist.  Eyes: Conjunctivae and EOM are normal. Pupils are equal, round, and reactive to light.  Neck: Normal range of motion. Neck supple.  Cardiovascular: Normal rate, regular rhythm, normal heart sounds and intact distal pulses.   Respiratory: Effort normal and breath sounds normal.  GI: Soft. Bowel sounds are normal.  Musculoskeletal: He exhibits tenderness. He exhibits no edema.  Neurological: He is alert and oriented to person, place, and time. He has normal reflexes.  Skin: Skin is warm and dry.  Psychiatric: He has a normal mood and affect. His behavior is normal. Thought content normal.      Assessment/Plan #1 sickle cell painful crisis: Patient is seen and admitted for observation. He  will be started on Dilaudid PCA, Toradol, IV fluid as well as his home medications. We will obtain relevant laboratory tests.  #2 sickle cell anemia: Will follow his H&H closely. If there is any sign of hemolytic crisis we'll admit him to the hospital proper  #3 history of thromboembolism: Continued on Xarelto [name],[name] 12/10/2012, 9:17 AM

## 2012-12-11 IMAGING — CR DG CHEST 2V
2 series · 2 of 2 positions shown · non-contrast
Comparison: 01/12/2010

CLINICAL DATA: Left chest and abdominal pain, sickle cell crisis

CHEST - 2 VIEW

[w chest pa]
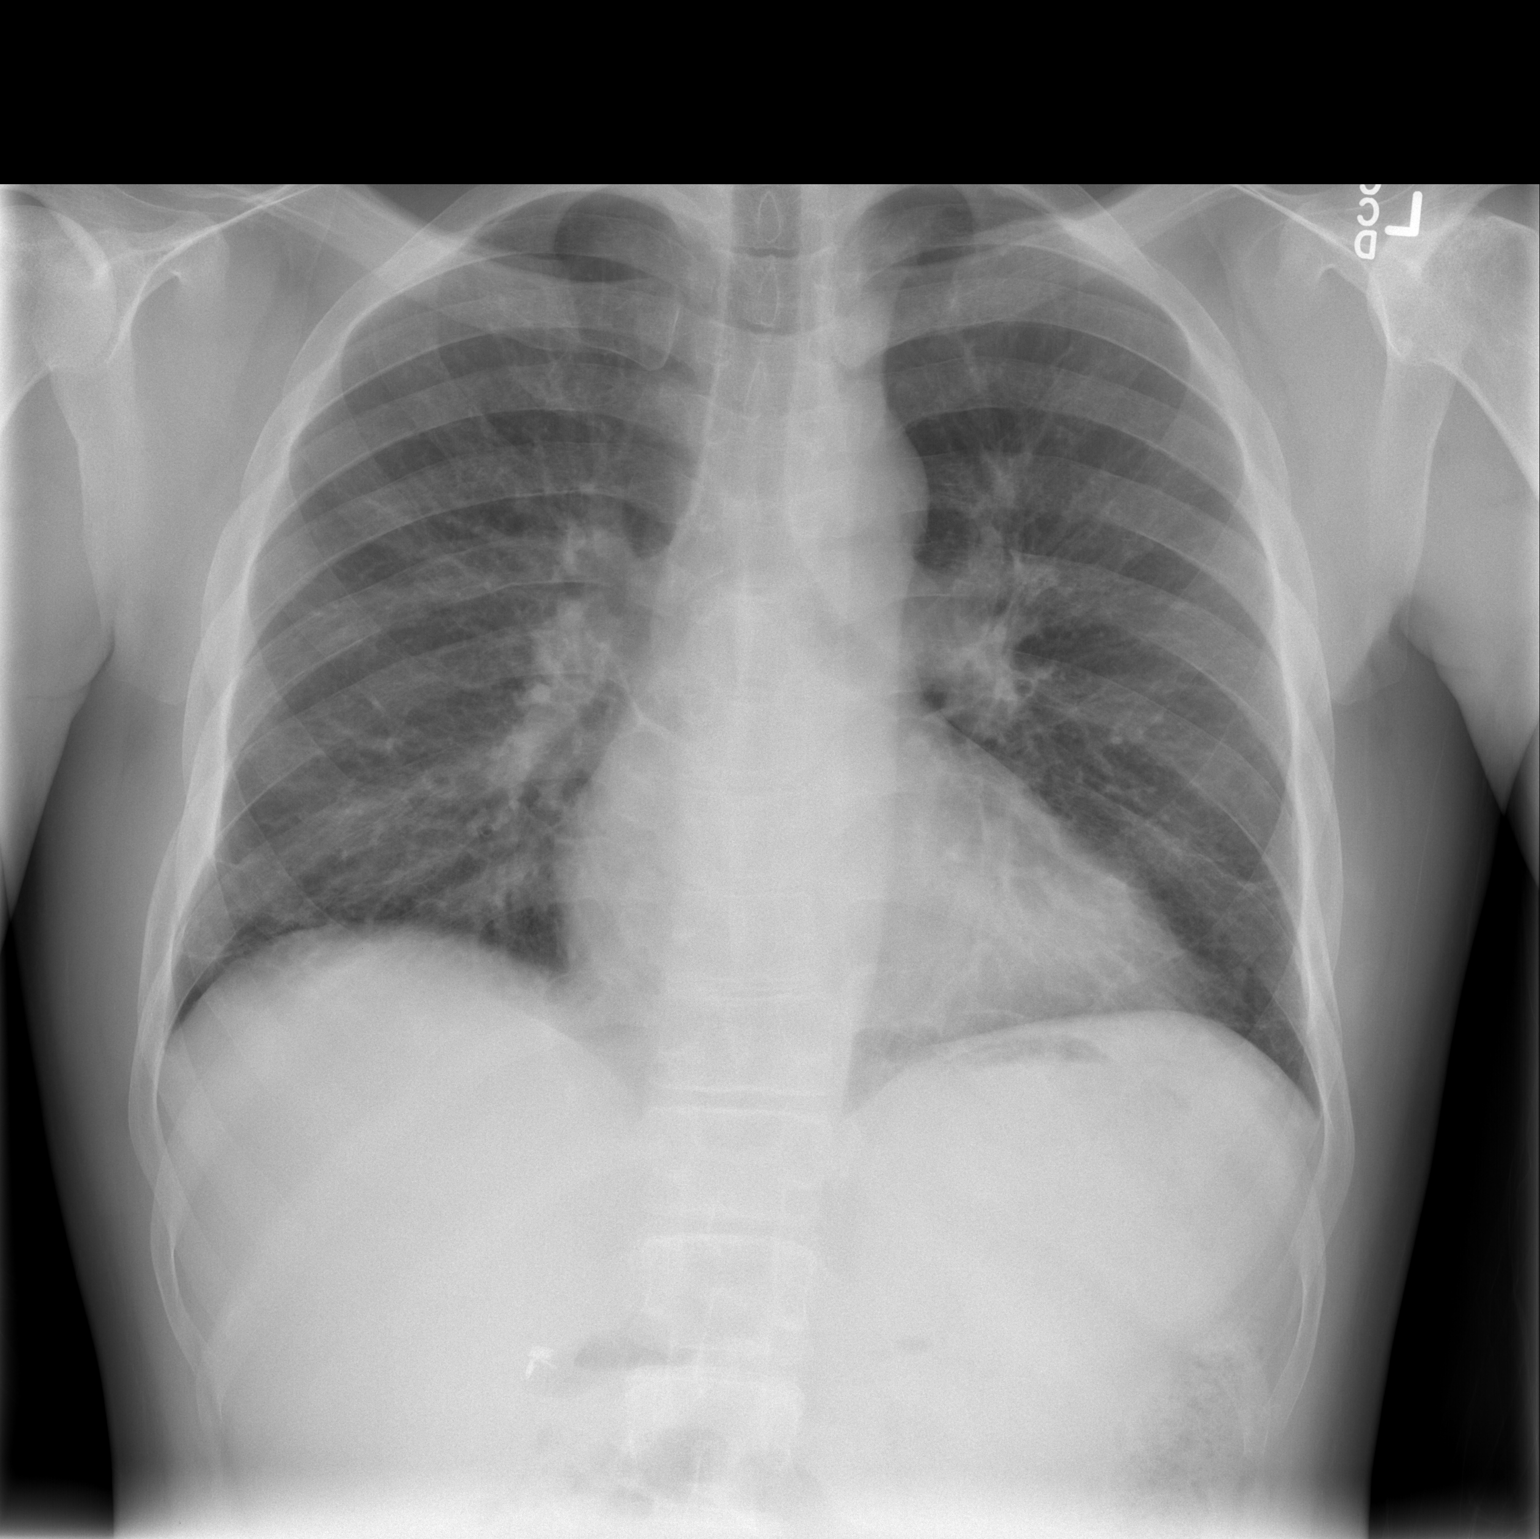

[w chest lat]
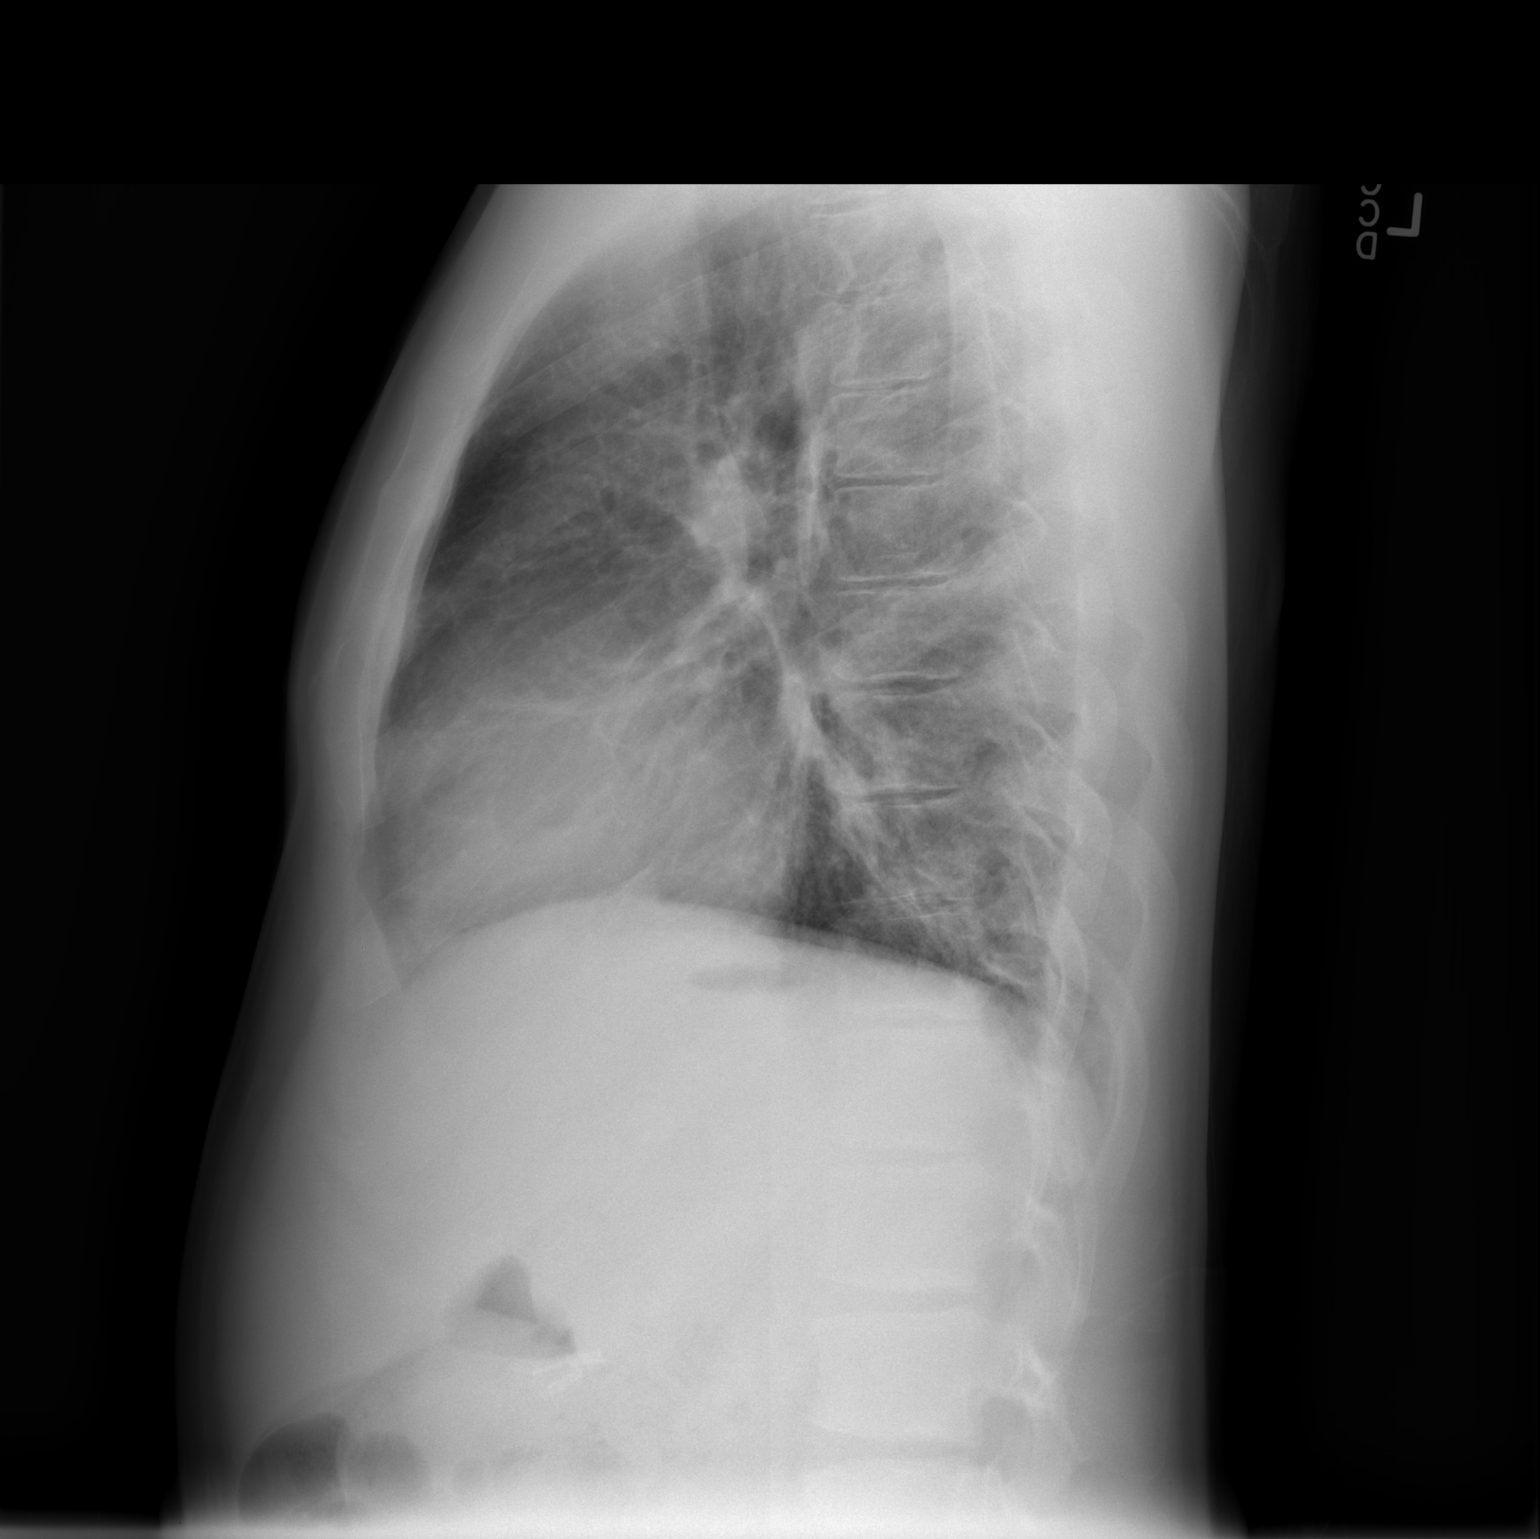

[2 of 2 positions shown; findings below may reference images not displayed]

FINDINGS: Enlargement of cardiac silhouette.
Mediastinal contours and pulmonary vascularity normal.
Bronchitic changes without infiltrate or effusion.
Minimal chronic increased markings at right lung base, question
scarring.
No pneumothorax.
Surgical clips right upper quadrant question cholecystectomy.
IMPRESSION: Chronic bronchitic changes with probable mild right basilar
scarring.
Enlargement of cardiac silhouette.

## 2012-12-15 ENCOUNTER — Inpatient Hospital Stay (HOSPITAL_COMMUNITY)
Admission: EM | Admit: 2012-12-15 | Discharge: 2012-12-24 | DRG: 811 | Disposition: A | Discharge status: Home / Self Care | Payer: Medicare Other | Attending: Internal Medicine | Admitting: Internal Medicine

## 2012-12-15 ENCOUNTER — Encounter (HOSPITAL_COMMUNITY): Payer: Self-pay | Admitting: Emergency Medicine

## 2012-12-15 DIAGNOSIS — D473 Essential (hemorrhagic) thrombocythemia: Secondary | ICD-10-CM | POA: Diagnosis present

## 2012-12-15 DIAGNOSIS — L299 Pruritus, unspecified: Secondary | ICD-10-CM | POA: Diagnosis present

## 2012-12-15 DIAGNOSIS — Z86718 Personal history of other venous thrombosis and embolism: Secondary | ICD-10-CM

## 2012-12-15 DIAGNOSIS — D5700 Hb-SS disease with crisis, unspecified: Secondary | ICD-10-CM | POA: Diagnosis not present

## 2012-12-15 DIAGNOSIS — Z7901 Long term (current) use of anticoagulants: Secondary | ICD-10-CM

## 2012-12-15 DIAGNOSIS — Z96649 Presence of unspecified artificial hip joint: Secondary | ICD-10-CM | POA: Diagnosis not present

## 2012-12-15 DIAGNOSIS — F411 Generalized anxiety disorder: Secondary | ICD-10-CM | POA: Diagnosis present

## 2012-12-15 DIAGNOSIS — Z885 Allergy status to narcotic agent status: Secondary | ICD-10-CM | POA: Diagnosis not present

## 2012-12-15 DIAGNOSIS — M545 Low back pain, unspecified: Secondary | ICD-10-CM | POA: Diagnosis not present

## 2012-12-15 DIAGNOSIS — Z832 Family history of diseases of the blood and blood-forming organs and certain disorders involving the immune mechanism: Secondary | ICD-10-CM | POA: Diagnosis not present

## 2012-12-15 DIAGNOSIS — Z86711 Personal history of pulmonary embolism: Secondary | ICD-10-CM

## 2012-12-15 DIAGNOSIS — D571 Sickle-cell disease without crisis: Secondary | ICD-10-CM | POA: Diagnosis not present

## 2012-12-15 DIAGNOSIS — I2699 Other pulmonary embolism without acute cor pulmonale: Secondary | ICD-10-CM

## 2012-12-15 DIAGNOSIS — I10 Essential (primary) hypertension: Secondary | ICD-10-CM | POA: Diagnosis present

## 2012-12-15 DIAGNOSIS — Z87891 Personal history of nicotine dependence: Secondary | ICD-10-CM | POA: Diagnosis not present

## 2012-12-15 DIAGNOSIS — Z79899 Other long term (current) drug therapy: Secondary | ICD-10-CM | POA: Diagnosis not present

## 2012-12-15 DIAGNOSIS — M8700 Idiopathic aseptic necrosis of unspecified bone: Secondary | ICD-10-CM | POA: Diagnosis not present

## 2012-12-15 DIAGNOSIS — D72829 Elevated white blood cell count, unspecified: Secondary | ICD-10-CM

## 2012-12-15 DIAGNOSIS — R040 Epistaxis: Secondary | ICD-10-CM | POA: Diagnosis not present

## 2012-12-15 LAB — URINALYSIS, ROUTINE W REFLEX MICROSCOPIC
Nitrite: NEGATIVE
Specific Gravity, Urine: 1.016 (ref 1.005–1.030)
Urobilinogen, UA: 1 mg/dL (ref 0.0–1.0)
pH: 6.5 (ref 5.0–8.0)

## 2012-12-15 LAB — CBC WITH DIFFERENTIAL/PLATELET
Basophils Relative: 1 % (ref 0–1)
Eosinophils Absolute: 0.9 10*3/uL — ABNORMAL HIGH (ref 0.0–0.7)
Eosinophils Relative: 5 % (ref 0–5)
Hemoglobin: 7.8 g/dL — ABNORMAL LOW (ref 13.0–17.0)
Lymphocytes Relative: 23 % (ref 12–46)
MCHC: 34.2 g/dL (ref 30.0–36.0)
Monocytes Absolute: 3 10*3/uL — ABNORMAL HIGH (ref 0.1–1.0)
Neutrophils Relative %: 55 % (ref 43–77)
Platelets: 432 10*3/uL — ABNORMAL HIGH (ref 150–400)
RBC: 2.53 MIL/uL — ABNORMAL LOW (ref 4.22–5.81)
WBC: 18.7 10*3/uL — ABNORMAL HIGH (ref 4.0–10.5)

## 2012-12-15 LAB — COMPREHENSIVE METABOLIC PANEL
ALT: 31 U/L (ref 0–53)
AST: 41 U/L — ABNORMAL HIGH (ref 0–37)
Albumin: 4.2 g/dL (ref 3.5–5.2)
Alkaline Phosphatase: 97 U/L (ref 39–117)
CO2: 25 mEq/L (ref 19–32)
Calcium: 8.8 mg/dL (ref 8.4–10.5)
Creatinine, Ser: 0.68 mg/dL (ref 0.50–1.35)
GFR calc Af Amer: >90 mL/min (ref >90)
Glucose, Bld: 111 mg/dL — ABNORMAL HIGH (ref 70–99)
Potassium: 3.8 mEq/L (ref 3.5–5.1)
Sodium: 137 mEq/L (ref 135–145)
Total Protein: 7.7 g/dL (ref 6.0–8.3)

## 2012-12-15 LAB — RETICULOCYTES
RBC.: 2.53 MIL/uL — ABNORMAL LOW (ref 4.22–5.81)
Retic Count, Absolute: 285.9 10*3/uL — ABNORMAL HIGH (ref 19.0–186.0)
Retic Ct Pct: 11.3 % — ABNORMAL HIGH (ref 0.4–3.1)

## 2012-12-15 LAB — URINE MICROSCOPIC-ADD ON

## 2012-12-15 MED ORDER — RIVAROXABAN 20 MG PO TABS
20.0000 mg | ORAL_TABLET | Freq: Every morning | ORAL | Status: DC
  Administered 2012-12-16 – 2012-12-24 (×9): 20 mg via ORAL
  Filled 2012-12-15 (×9): qty 1

## 2012-12-15 MED ORDER — ZOLPIDEM TARTRATE 10 MG PO TABS
10.0000 mg | ORAL_TABLET | Freq: Every evening | ORAL | Status: DC | PRN
  Administered 2012-12-17 – 2012-12-22 (×6): 10 mg via ORAL
  Filled 2012-12-15 (×6): qty 1

## 2012-12-15 MED ORDER — SODIUM CHLORIDE 0.9 % IV SOLN
250.0000 mL | INTRAVENOUS | Status: DC | PRN
Start: 2012-12-15 — End: 2012-12-18

## 2012-12-15 MED ORDER — HYDROMORPHONE 0.3 MG/ML IV SOLN
INTRAVENOUS | Status: DC
  Administered 2012-12-15 – 2012-12-16 (×2): via INTRAVENOUS
  Administered 2012-12-16: 6.39 mg via INTRAVENOUS
  Administered 2012-12-16: 04:00:00 via INTRAVENOUS
  Administered 2012-12-16: 5.88 mg via INTRAVENOUS
  Administered 2012-12-16: 08:00:00 via INTRAVENOUS
  Administered 2012-12-16: 7.6 mg via INTRAVENOUS
  Administered 2012-12-16: 21:00:00 via INTRAVENOUS
  Administered 2012-12-16: 7.49 mg via INTRAVENOUS
  Administered 2012-12-16: 12:00:00 via INTRAVENOUS
  Administered 2012-12-16: 7.64 mg via INTRAVENOUS
  Administered 2012-12-17: 02:00:00 via INTRAVENOUS
  Administered 2012-12-17: 8.45 mg via INTRAVENOUS
  Administered 2012-12-17: 09:00:00 via INTRAVENOUS
  Administered 2012-12-17: 6.01 mg via INTRAVENOUS
  Administered 2012-12-17: 06:00:00 via INTRAVENOUS
  Administered 2012-12-17: 9.86 mg via INTRAVENOUS
  Filled 2012-12-15 (×9): qty 25

## 2012-12-15 MED ORDER — HYDROXYUREA 500 MG PO CAPS
1000.0000 mg | ORAL_CAPSULE | Freq: Every day | ORAL | Status: DC
  Administered 2012-12-16 – 2012-12-24 (×9): 1000 mg via ORAL
  Filled 2012-12-15 (×9): qty 2

## 2012-12-15 MED ORDER — POTASSIUM CHLORIDE CRYS ER 20 MEQ PO TBCR
20.0000 meq | EXTENDED_RELEASE_TABLET | Freq: Every morning | ORAL | Status: DC
  Administered 2012-12-16 – 2012-12-24 (×9): 20 meq via ORAL
  Filled 2012-12-15 (×9): qty 1

## 2012-12-15 MED ORDER — SODIUM CHLORIDE 0.9 % IV BOLUS (SEPSIS)
1000.0000 mL | Freq: Once | INTRAVENOUS | Status: AC
  Administered 2012-12-15: 1000 mL via INTRAVENOUS

## 2012-12-15 MED ORDER — HYDROMORPHONE HCL PF 2 MG/ML IJ SOLN
4.0000 mg | Freq: Once | INTRAMUSCULAR | Status: AC
  Administered 2012-12-15: 4 mg via INTRAVENOUS
  Filled 2012-12-15: qty 2

## 2012-12-15 MED ORDER — MORPHINE SULFATE ER 15 MG PO TBCR
15.0000 mg | EXTENDED_RELEASE_TABLET | Freq: Two times a day (BID) | ORAL | Status: DC
  Administered 2012-12-16 – 2012-12-24 (×18): 15 mg via ORAL
  Filled 2012-12-15 (×18): qty 1

## 2012-12-15 MED ORDER — ONDANSETRON HCL 4 MG PO TABS: 4.0000 mg | ORAL_TABLET | ORAL | Status: DC | PRN

## 2012-12-15 MED ORDER — CELECOXIB 200 MG PO CAPS
200.0000 mg | ORAL_CAPSULE | Freq: Two times a day (BID) | ORAL | Status: DC
  Administered 2012-12-16 (×2): 200 mg via ORAL
  Filled 2012-12-15 (×3): qty 1

## 2012-12-15 MED ORDER — SODIUM CHLORIDE 0.9 % IJ SOLN
INTRAMUSCULAR | Status: AC
  Administered 2012-12-15: 10 mL
  Filled 2012-12-15: qty 10

## 2012-12-15 MED ORDER — DIPHENHYDRAMINE HCL 50 MG/ML IJ SOLN
12.5000 mg | Freq: Four times a day (QID) | INTRAMUSCULAR | Status: DC | PRN
  Administered 2012-12-15: 25 mg via INTRAVENOUS
  Administered 2012-12-16 – 2012-12-18 (×7): 12.5 mg via INTRAVENOUS
  Filled 2012-12-15 (×7): qty 1

## 2012-12-15 MED ORDER — NALOXONE HCL 0.4 MG/ML IJ SOLN: 0.4000 mg | INTRAMUSCULAR | Status: DC | PRN

## 2012-12-15 MED ORDER — ONDANSETRON HCL 4 MG/2ML IJ SOLN: 4.0000 mg | INTRAMUSCULAR | Status: DC | PRN

## 2012-12-15 MED ORDER — DIPHENHYDRAMINE HCL 12.5 MG/5ML PO ELIX: 12.5000 mg | ORAL_SOLUTION | Freq: Four times a day (QID) | ORAL | Status: DC | PRN

## 2012-12-15 MED ORDER — DIPHENHYDRAMINE HCL 50 MG/ML IJ SOLN
INTRAMUSCULAR | Status: AC
  Administered 2012-12-15: 25 mg via INTRAVENOUS
  Filled 2012-12-15: qty 1

## 2012-12-15 MED ORDER — SODIUM CHLORIDE 0.9 % IV SOLN
25.0000 mg | INTRAVENOUS | Status: DC | PRN
  Filled 2012-12-15: qty 0.5

## 2012-12-15 MED ORDER — HYDROMORPHONE HCL 4 MG PO TABS
4.0000 mg | ORAL_TABLET | ORAL | Status: DC | PRN
  Administered 2012-12-16 (×2): 4 mg via ORAL
  Filled 2012-12-15 (×2): qty 1

## 2012-12-15 MED ORDER — HYDROMORPHONE HCL PF 2 MG/ML IJ SOLN
2.0000 mg | Freq: Once | INTRAMUSCULAR | Status: AC
  Administered 2012-12-15: 2 mg via INTRAVENOUS
  Filled 2012-12-15: qty 1

## 2012-12-15 MED ORDER — SODIUM CHLORIDE 0.9 % IJ SOLN: 3.0000 mL | INTRAMUSCULAR | Status: DC | PRN

## 2012-12-15 MED ORDER — SODIUM CHLORIDE 0.9 % IJ SOLN: 9.0000 mL | INTRAMUSCULAR | Status: DC | PRN

## 2012-12-15 MED ORDER — FOLIC ACID 1 MG PO TABS
1.0000 mg | ORAL_TABLET | Freq: Every morning | ORAL | Status: DC
  Administered 2012-12-16 – 2012-12-24 (×9): 1 mg via ORAL
  Filled 2012-12-15 (×9): qty 1

## 2012-12-15 MED ORDER — KETOROLAC TROMETHAMINE 30 MG/ML IJ SOLN
15.0000 mg | Freq: Once | INTRAMUSCULAR | Status: AC
  Administered 2012-12-15: 15 mg via INTRAVENOUS
  Filled 2012-12-15: qty 1

## 2012-12-15 MED ORDER — SODIUM CHLORIDE 0.9 % IJ SOLN
3.0000 mL | Freq: Two times a day (BID) | INTRAMUSCULAR | Status: DC
  Administered 2012-12-20 – 2012-12-23 (×4): 3 mL via INTRAVENOUS

## 2012-12-15 NOTE — ED Provider Notes (Signed)
CSN: [id number]     Arrival date & time 12/15/12  1717 History   First MD Initiated Contact with Patient 12/15/12 1735     Chief Complaint  Patient presents with  . Sickle Cell Pain Crisis   (Consider location/radiation/quality/duration/timing/severity/associated sxs/prior Treatment) HPI  [name] is a 32 y.o. male with past medical history significant for sickle cell, pulmonary embolism, currently taking Xarelto complaining of bilateral upper lumbar back pain worsening over the course of the last 4 days. This is typical of his sickle cell pain crises. Patient has been taking oral Dilaudid and MS Contin at home with little relief. He had a single episode of nonbloody, nonbilious, no c and a offee ground emesis yesterday, now tolerating by mouth's. Patient denies fever, chest pain, cough, shortness of breath, change in bowel or bladder habits.  Past Medical History  Diagnosis Date  . Sickle cell anemia   . Blood transfusion   . 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 Hypokalemia   . Mood disorder   . Pulmonary embolism   . Avascular necrosis   . Leukocytosis     Chronic  . Thrombocytosis     Chronic  . Hypertension   . C. difficile colitis    Past Surgical History  Procedure Laterality Date  . Right hip replacement      08/2006  . Cholecystectomy      01/2008  . Porta cath placement    . Porta cath removal    . Umbilical hernia repair      01/2008  . Excision of left periauricular cyst      10/2009  . Excision of right ear lobe cyst with primary closur      11/2007  . Portacath placement  01/05/2012    Procedure: INSERTION PORT-A-CATH;  Surgeon: Adolph Pollack, MD;  Location: [facility] OR;  Service: General;  Laterality: N/A;  ultrasound guiced port a cath insertion with fluoroscopy   Family History  Problem Relation Age of Onset  . Sickle cell anemia Mother   . Sickle cell anemia Father   . Sickle cell trait Brother    History  Substance  Use Topics  . Smoking status: Former Smoker -- 13 years    Types: Cigarettes    Quit date: 07/08/2010  . Smokeless tobacco: Never Used  . Alcohol Use: No    Review of Systems 10 systems reviewed and found to be negative, except as noted in the HPI  Allergies  Morphine and related  Home Medications   Current Outpatient Rx  Name  Route  Sig  Dispense  Refill  . celecoxib (CELEBREX) 200 MG capsule   Oral   Take 1 capsule (200 mg total) by mouth 2 (two) times daily.   60 capsule   1   . folic acid (FOLVITE) 1 MG tablet   Oral   Take 1 tablet (1 mg total) by mouth every morning.   30 tablet   11   . HYDROmorphone (DILAUDID) 4 MG tablet      Take 8 mg every 4 hours for next 24 hours then resume 4 mg every 4 hours as needed for pain.   90 tablet   0   . hydroxyurea (HYDREA) 500 MG capsule   Oral   Take 2 capsules (1,000 mg total) by mouth daily. May take with food to minimize GI side effects.   60 capsule   1   . morphine (MS CONTIN)  15 MG 12 hr tablet   Oral   Take 1 tablet (15 mg total) by mouth 2 (two) times daily.   60 tablet   0   . potassium chloride SA (K-DUR,KLOR-CON) 20 MEQ tablet   Oral   Take 1 tablet (20 mEq total) by mouth every morning.   30 tablet   3   . Rivaroxaban (XARELTO) 20 MG TABS tablet   Oral   Take 1 tablet (20 mg total) by mouth every morning.   30 tablet   2   . zolpidem (AMBIEN) 10 MG tablet   Oral   Take 1 tablet (10 mg total) by mouth at bedtime as needed for sleep.   30 tablet   2    BP 119/81  Pulse 91  Temp(Src) 98.3 F (36.8 C) (Oral)  Resp 18  SpO2 98% Physical Exam  Nursing note and vitals reviewed. Constitutional: He is oriented to person, place, and time. He appears well-developed and well-nourished. No distress.  HENT:  Head: Normocephalic and atraumatic.  Mouth/Throat: Oropharynx is clear and moist.  Eyes: Conjunctivae and EOM are normal. Pupils are equal, round, and reactive to light. Scleral icterus is  present.  Neck: Normal range of motion. Neck supple. No JVD present.  Cardiovascular: Normal rate and regular rhythm.   Pulmonary/Chest: Effort normal and breath sounds normal. No stridor. No respiratory distress. He has no wheezes. He has no rales. He exhibits no tenderness.  Abdominal: Soft. Bowel sounds are normal. He exhibits no distension and no mass. There is no tenderness. There is no rebound and no guarding.  Musculoskeletal: Normal range of motion. He exhibits no edema and no tenderness.  Neurological: He is alert and oriented to person, place, and time.  Psychiatric: He has a normal mood and affect.    ED Course  Procedures (including critical care time) Labs Review Labs Reviewed  CBC WITH DIFFERENTIAL - Abnormal; Notable for the following:    WBC 18.7 (*)    RBC 2.53 (*)    Hemoglobin 7.8 (*)    HCT 22.8 (*)    RDW 18.4 (*)    Platelets 432 (*)    Monocytes Relative 16 (*)    Neutro Abs 10.3 (*)    Lymphs Abs 4.3 (*)    Monocytes Absolute 3.0 (*)    Eosinophils Absolute 0.9 (*)    Basophils Absolute 0.2 (*)    All other components within normal limits  COMPREHENSIVE METABOLIC PANEL - Abnormal; Notable for the following:    Glucose, Bld 111 (*)    AST 41 (*)    Total Bilirubin 7.6 (*)    All other components within normal limits  RETICULOCYTES - Abnormal; Notable for the following:    Retic Ct Pct 11.3 (*)    RBC. 2.53 (*)    Retic Count, Manual 285.9 (*)    All other components within normal limits  URINALYSIS, ROUTINE W REFLEX MICROSCOPIC   Imaging Review No results found.  EKG Interpretation   None       MDM   1. Sickle cell crisis      Filed Vitals:   12/15/12 1729  BP: 119/81  Pulse: 91  Temp: 98.3 F (36.8 C)  TempSrc: Oral  Resp: 18  SpO2: 98%     [name] is a 32 y.o. male presenting with severe, low back pain typical of prior sickle cell pain crises, worsening over the course of the last 3 days. No indications of acute  chest or any other complicating pathologies. Bloodwork consistent with pain crises. Bilirubin is significantly elevated at 7.6, he is mildly anemic at 7.8 over 22.8, this is typical for his baseline. Patient does have a leukocytosis of 18.7, however, patient has chronically elevated white blood cell count. Also reticulocyte percentage is 11.3, the highest on record for him. Patient has been given a total of 10 mg of Dilaudid over the course of the last few hours with little relief in his pain. He will need to be admitted to the hospital for acute pain crises to Dr. Benjamine Mola.  Medications  diphenhydrAMINE (BENADRYL) 25 mg in sodium chloride 0.9 % 50 mL IVPB (not administered)  HYDROmorphone (DILAUDID) injection 4 mg (not administered)  sodium chloride 0.9 % bolus 1,000 mL (0 mLs Intravenous Stopped 12/15/12 2001)  ketorolac (TORADOL) 30 MG/ML injection 15 mg (15 mg Intravenous Given 12/15/12 1843)  HYDROmorphone (DILAUDID) injection 2 mg (2 mg Intravenous Given 12/15/12 1843)  sodium chloride 0.9 % injection (10 mLs  Given 12/15/12 1831)  diphenhydrAMINE (BENADRYL) 50 MG/ML injection (25 mg  Given 12/15/12 1856)  HYDROmorphone (DILAUDID) injection 4 mg (4 mg Intravenous Given 12/15/12 1907)  HYDROmorphone (DILAUDID) injection 4 mg (4 mg Intravenous Given 12/15/12 2010)   Note: Portions of this report may have been transcribed using voice recognition software. Every effort was made to ensure accuracy; however, inadvertent computerized transcription errors may be present      Wynetta Emery, PA-C 12/15/12 2129

## 2012-12-15 NOTE — H&P (Signed)
[facility] History and Physical  [name] [name] DOB: Dec 07, 1980 DOA: 12/15/2012  Referring physician: er PCP: [name],[name], MD  Specialists:   Chief Complaint: pain  HPI: [name] is a 32 y.o. male  Who was d/c'd from the [facility] center on 11/10.  He was doing ok up to yesterday, when he developed pain not resolved with his home medications.  He came to the ER, was given IV pain meds with no relief.  He denies fever, chills.  Pain is similar to his previous crises.  Increased bili and retic.  Patient admitted to team 9.   Review of Systems: all systems reviewed, negative unless stated above   Past Medical History  Diagnosis Date  . Sickle cell anemia   . Blood transfusion   . 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 Hypokalemia   . Mood disorder   . Pulmonary embolism   . Avascular necrosis   . Leukocytosis     Chronic  . Thrombocytosis     Chronic  . Hypertension   . C. difficile colitis    Past Surgical History  Procedure Laterality Date  . Right hip replacement      08/2006  . Cholecystectomy      01/2008  . Porta cath placement    . Porta cath removal    . Umbilical hernia repair      01/2008  . Excision of left periauricular cyst      10/2009  . Excision of right ear lobe cyst with primary closur      11/2007  . Portacath placement  01/05/2012    Procedure: INSERTION PORT-A-CATH;  Surgeon: Adolph Pollack, MD;  Location: [facility] OR;  Service: General;  Laterality: N/A;  ultrasound guiced port a cath insertion with fluoroscopy   Social History:  reports that he quit smoking about 2 years ago. His smoking use included Cigarettes. He smoked 0.00 packs per day for 13 years. He has never used smokeless tobacco. He reports that he does not drink alcohol or use illicit drugs.?  Allergies  Allergen Reactions  . Morphine And Related Hives and Rash    Pt states he can take the pill form, but not IV and  he is able to tolerate Dilaudid with no reactions.    Family History  Problem Relation Age of Onset  . Sickle cell anemia Mother   . Sickle cell anemia Father   . Sickle cell trait Brother     Prior to Admission medications   Medication Sig Start Date End Date Taking? Authorizing Provider  celecoxib (CELEBREX) 200 MG capsule Take 1 capsule (200 mg total) by mouth 2 (two) times daily. 11/19/12  Yes Altha Harm, MD  folic acid (FOLVITE) 1 MG tablet Take 1 tablet (1 mg total) by mouth every morning. 06/05/12  Yes Altha Harm, MD  HYDROmorphone (DILAUDID) 4 MG tablet Take 8 mg every 4 hours for next 24 hours then resume 4 mg every 4 hours as needed for pain. 12/04/12  Yes Altha Harm, MD  hydroxyurea (HYDREA) 500 MG capsule Take 2 capsules (1,000 mg total) by mouth daily. May take with food to minimize GI side effects. 11/19/12  Yes Altha Harm, MD  morphine (MS CONTIN) 15 MG 12 hr tablet Take 1 tablet (15 mg total) by mouth 2 (two) times daily. 11/21/12  Yes Altha Harm, MD  potassium chloride SA (K-DUR,KLOR-CON) 20 MEQ tablet Take  1 tablet (20 mEq total) by mouth every morning. 07/31/12  Yes Altha Harm, MD  Rivaroxaban (XARELTO) 20 MG TABS tablet Take 1 tablet (20 mg total) by mouth every morning. 11/21/12  Yes Altha Harm, MD  zolpidem (AMBIEN) 10 MG tablet Take 1 tablet (10 mg total) by mouth at bedtime as needed for sleep. 11/28/12  Yes Grayce Sessions, NP   Physical Exam: Filed Vitals:   12/15/12 1729  BP: 119/81  Pulse: 91  Temp: 98.3 F (36.8 C)  Resp: 18    Constitutional: He is oriented to person, place, and time. He appears well-developed and well-nourished- unable to get comfortable  HENT:  Head: Normocephalic and atraumatic.  Mouth/Throat: Oropharynx is clear and moist.  Eyes: Conjunctivae and EOM are normal. Pupils are equal, round, and reactive to light.  Neck: Normal range of motion. Neck supple.  Cardiovascular:  Normal rate, regular rhythm, normal heart sounds and intact distal pulses.  Respiratory: Effort normal and breath sounds normal.  GI: Soft. Bowel sounds are normal.  Musculoskeletal: +tenderness in rib region, no edema.  Neurological: He is alert and oriented to person, place, and time. He has normal reflexes.  Skin: Skin is warm and dry.  Psychiatric: He has a normal mood and affect. His behavior is normal. Thought content normal  Labs on Admission:  Basic Metabolic Panel:  Recent Labs Lab 12/09/12 0845 12/10/12 0847 12/15/12 1825  NA 136 135 137  K 3.8 3.8 3.8  CL 104 105 102  CO2 24 23 25   GLUCOSE 103* 102* 111*  BUN 8 6 9   CREATININE 0.57 0.52 0.68  CALCIUM 9.0 9.1 8.8   Liver Function Tests:  Recent Labs Lab 12/09/12 0845 12/10/12 0847 12/15/12 1825  AST 30 36 41*  ALT 19 21 31   ALKPHOS 100 104 97  BILITOT 4.7* 5.4* 7.6*  PROT 7.6 7.8 7.7  ALBUMIN 3.9 4.1 4.2   No results found for this basename: LIPASE, AMYLASE,  in the last 168 hours No results found for this basename: AMMONIA,  in the last 168 hours CBC:  Recent Labs Lab 12/09/12 0845 12/10/12 0847 12/15/12 1825  WBC 16.6* 15.5* 18.7*  NEUTROABS 11.4* 10.5* 10.3*  HGB 8.2* 8.4* 7.8*  HCT 24.1* 24.8* 22.8*  MCV 90.6 90.8 90.1  PLT 591* 561* 432*   Cardiac Enzymes: No results found for this basename: CKTOTAL, CKMB, CKMBINDEX, TROPONINI,  in the last 168 hours  BNP (last 3 results)  Recent Labs  11/13/12 1826  PROBNP 106.3   CBG: No results found for this basename: GLUCAP,  in the last 168 hours  Radiological Exams on Admission: No results found.    Assessment/Plan Active Problems:   * No active hospital problems. *   Hb SS with crisis: Pt with early crisis. Will treat initially with bolus dilaudid and then start PCA per recs by Dr. Ashley Royalty.  heat as adjunctive treatment.  Continue MS Contin. H/h only mildly decreased, increased bili and retic  H/o embolism- continue  xarelto  Placed on team 9   Time spent: 75 min  VANN, JESSICA [facility] Pager [phone]  If 7PM-7AM, please contact night-coverage [email or web address] Password [email or web address] 12/15/2012, 9:20 PM

## 2012-12-15 NOTE — ED Provider Notes (Signed)
  Medical screening examination/treatment/procedure(s) were performed by non-physician practitioner and as supervising physician I was immediately available for consultation/collaboration.     Sabrinna Yearwood, MD 12/15/12 2316 

## 2012-12-15 NOTE — ED Notes (Signed)
He c/o sickle cell pain "all in my back".

## 2012-12-16 DIAGNOSIS — Z7901 Long term (current) use of anticoagulants: Secondary | ICD-10-CM | POA: Diagnosis not present

## 2012-12-16 DIAGNOSIS — D72829 Elevated white blood cell count, unspecified: Secondary | ICD-10-CM | POA: Diagnosis not present

## 2012-12-16 DIAGNOSIS — F411 Generalized anxiety disorder: Secondary | ICD-10-CM | POA: Diagnosis not present

## 2012-12-16 DIAGNOSIS — R040 Epistaxis: Secondary | ICD-10-CM | POA: Diagnosis not present

## 2012-12-16 DIAGNOSIS — D571 Sickle-cell disease without crisis: Secondary | ICD-10-CM

## 2012-12-16 DIAGNOSIS — I2699 Other pulmonary embolism without acute cor pulmonale: Secondary | ICD-10-CM | POA: Diagnosis not present

## 2012-12-16 DIAGNOSIS — D5700 Hb-SS disease with crisis, unspecified: Secondary | ICD-10-CM | POA: Diagnosis not present

## 2012-12-16 LAB — CBC WITH DIFFERENTIAL/PLATELET
Basophils Absolute: 0.2 10*3/uL — ABNORMAL HIGH (ref 0.0–0.1)
Basophils Relative: 1 % (ref 0–1)
Eosinophils Absolute: 0.9 10*3/uL — ABNORMAL HIGH (ref 0.0–0.7)
Eosinophils Relative: 4 % (ref 0–5)
HCT: 21.7 % — ABNORMAL LOW (ref 39.0–52.0)
Hemoglobin: 7.5 g/dL — ABNORMAL LOW (ref 13.0–17.0)
Lymphocytes Relative: 27 % (ref 12–46)
Lymphs Abs: 6.3 10*3/uL — ABNORMAL HIGH (ref 0.7–4.0)
MCV: 90.4 fL (ref 78.0–100.0)
Monocytes Relative: 14 % — ABNORMAL HIGH (ref 3–12)
Neutro Abs: 12.8 10*3/uL — ABNORMAL HIGH (ref 1.7–7.7)
Neutrophils Relative %: 54 % (ref 43–77)
RBC: 2.4 MIL/uL — ABNORMAL LOW (ref 4.22–5.81)
RDW: 18.4 % — ABNORMAL HIGH (ref 11.5–15.5)
WBC: 23.5 10*3/uL — ABNORMAL HIGH (ref 4.0–10.5)

## 2012-12-16 LAB — COMPREHENSIVE METABOLIC PANEL
ALT: 28 U/L (ref 0–53)
Albumin: 4.2 g/dL (ref 3.5–5.2)
Alkaline Phosphatase: 102 U/L (ref 39–117)
BUN: 13 mg/dL (ref 6–23)
Calcium: 8.7 mg/dL (ref 8.4–10.5)
Creatinine, Ser: 0.69 mg/dL (ref 0.50–1.35)
GFR calc Af Amer: >90 mL/min (ref >90)
Glucose, Bld: 107 mg/dL — ABNORMAL HIGH (ref 70–99)
Potassium: 3.7 mEq/L (ref 3.5–5.1)
Total Protein: 7.7 g/dL (ref 6.0–8.3)

## 2012-12-16 LAB — RETICULOCYTES
RBC.: 2.4 MIL/uL — ABNORMAL LOW (ref 4.22–5.81)
Retic Count, Absolute: 331.2 10*3/uL — ABNORMAL HIGH (ref 19.0–186.0)
Retic Ct Pct: 13.8 % — ABNORMAL HIGH (ref 0.4–3.1)

## 2012-12-16 LAB — MAGNESIUM: Magnesium: 1.7 mg/dL (ref 1.5–2.5)

## 2012-12-16 MED ORDER — HYDROMORPHONE HCL PF 2 MG/ML IJ SOLN
4.0000 mg | Freq: Once | INTRAMUSCULAR | Status: AC
  Administered 2012-12-16: 4 mg via INTRAVENOUS
  Filled 2012-12-16: qty 2

## 2012-12-16 MED ORDER — KETOROLAC TROMETHAMINE 30 MG/ML IJ SOLN
30.0000 mg | Freq: Four times a day (QID) | INTRAMUSCULAR | Status: AC
  Administered 2012-12-16 – 2012-12-21 (×20): 30 mg via INTRAVENOUS
  Filled 2012-12-16 (×24): qty 1

## 2012-12-16 NOTE — Progress Notes (Signed)
Subjective: 32 year old gentleman well-known to our service who was admitted last night with sickle cell painful crisis. Patient is on Dilaudid PCA pump with IV Toradol as well as oral Dilaudid and MS Contin. He still having pain at 8/10 and not getting adequate control according to him. He seems to be in acute crisis based on his laboratory results. He denied fever or cough. Denied nausea vomiting or diarrhea. He is on chronic anticoagulation and he has been taking his medications. He has been taking his medications at home apparently. Pain is mainly localized to his lower back and his lower extremities. He has history of avascular necrosis but does not believe this is the cause of his current symptoms. He has so far use 22 mg of Dilaudid and 11 demands in 12 hours. He is complaining of itching all over especially in his legs. He has Benadryl ordered for the itching.  Objective: Vital signs in last 24 hours: Temp:  [97.7 F (36.5 C)-99 F (37.2 C)] 98.6 F (37 C) (11/16 0959) Pulse Rate:  [61-95] 89 (11/16 0959) Resp:  [16-24] 20 (11/16 1157) BP: (119-140)/(77-87) 127/77 mmHg (11/16 0959) SpO2:  [93 %-100 %] 96 % (11/16 0959) Weight:  [78.6 kg (173 lb 4.5 oz)] 78.6 kg (173 lb 4.5 oz) (11/15 2220) Weight change:  Last BM Date: 12/15/12  Intake/Output from previous day: 11/15 0701 - 11/16 0700 In: 490 [P.O.:480; I.V.:10] Out: 700 [Urine:700] Intake/Output this shift: Total I/O In: 240 [P.O.:240] Out: 500 [Urine:500]  General appearance: alert, cooperative and no distress Eyes: conjunctivae/corneas clear. PERRL, EOM's intact. Fundi benign. Ears: normal TM's and external ear canals both ears Back: symmetric, no curvature. ROM normal. No CVA tenderness. Resp: clear to auscultation bilaterally Chest wall: no tenderness Cardio: regular rate and rhythm, S1, S2 normal, no murmur, click, rub or gallop GI: soft, non-tender; bowel sounds normal; no masses,  no organomegaly Extremities:  extremities normal, atraumatic, no cyanosis or edema Pulses: 2+ and symmetric Skin: Skin color, texture, turgor normal. No rashes or lesions Neurologic: Grossly normal  Lab Results:  Recent Labs  12/15/12 1825 12/16/12 0555  WBC 18.7* 23.5*  HGB 7.8* 7.5*  HCT 22.8* 21.7*  PLT 432* 417*   BMET  Recent Labs  12/15/12 1825 12/16/12 0555  NA 137 138  K 3.8 3.7  CL 102 105  CO2 25 22  GLUCOSE 111* 107*  BUN 9 13  CREATININE 0.68 0.69  CALCIUM 8.8 8.7    Studies/Results: No results found.  Medications: I have reviewed the patient's current medications.  Assessment/Plan: A 32 year old gentleman with sickle cell painful crisis.  #1 sickle cell hemolytic crisis: His hemoglobin has dropped significantly to 7.5 from 8.5 which is his baseline. History to Has also been elevated to more than 300 as well as his total bilirubin is seems to be elevated from his baseline. He seemed to be having some minor hemolytic crisis. Patient does not require active blood transfusion at this point. However he if his symptoms do not improve or if his hemoglobin drops below 7 I will consider blood transfusion or exchange transfusion.  #2 sickle cell painful crisis: Patient is currently on Dilaudid PCA his oral medication as well as Toradol. I will make the Toradol scheduled for the next 48 hours. We'll also give him occasional IV Dilaudid push for breakthrough. He based no improvement by end of the day I will change the dose of his Dilaudid PCA as we can deliver more per push.  #3 history of  thromboembolism: Patient will continue his Xarelto in the hospital.  #4 leukocytosis: He has markedly elevated leukocyte secondary to vaso-occlusive crisis. We will continue to monitor.  #5 hypertension: His blood pressure seems to be controlled at this point.  #6 mood disorder: Patient since stabilized at this point. We will continue his home medications the  LOS: 1 day   Lashane Whelpley,[name] 12/16/2012, 12:00  PM

## 2012-12-17 DIAGNOSIS — D5700 Hb-SS disease with crisis, unspecified: Secondary | ICD-10-CM | POA: Diagnosis not present

## 2012-12-17 DIAGNOSIS — F411 Generalized anxiety disorder: Secondary | ICD-10-CM | POA: Diagnosis not present

## 2012-12-17 DIAGNOSIS — R040 Epistaxis: Secondary | ICD-10-CM | POA: Diagnosis not present

## 2012-12-17 DIAGNOSIS — D72829 Elevated white blood cell count, unspecified: Secondary | ICD-10-CM | POA: Diagnosis not present

## 2012-12-17 DIAGNOSIS — I2699 Other pulmonary embolism without acute cor pulmonale: Secondary | ICD-10-CM | POA: Diagnosis not present

## 2012-12-17 DIAGNOSIS — Z7901 Long term (current) use of anticoagulants: Secondary | ICD-10-CM | POA: Diagnosis not present

## 2012-12-17 LAB — COMPREHENSIVE METABOLIC PANEL
ALT: 22 U/L (ref 0–53)
AST: 33 U/L (ref 0–37)
Albumin: 4.1 g/dL (ref 3.5–5.2)
Albumin: 4.1 g/dL (ref 3.5–5.2)
Alkaline Phosphatase: 95 U/L (ref 39–117)
BUN: 11 mg/dL (ref 6–23)
CO2: 25 mEq/L (ref 19–32)
Calcium: 8.9 mg/dL (ref 8.4–10.5)
Calcium: 8.9 mg/dL (ref 8.4–10.5)
Chloride: 109 mEq/L (ref 96–112)
Chloride: 109 mEq/L (ref 96–112)
Creatinine, Ser: 0.56 mg/dL (ref 0.50–1.35)
GFR calc Af Amer: >90 mL/min (ref >90)
GFR calc non Af Amer: >90 mL/min (ref >90)
Glucose, Bld: 129 mg/dL — ABNORMAL HIGH (ref 70–99)
Potassium: 4.1 mEq/L (ref 3.5–5.1)
Sodium: 141 mEq/L (ref 135–145)
Total Bilirubin: 7.2 mg/dL — ABNORMAL HIGH (ref 0.3–1.2)
Total Protein: 7.5 g/dL (ref 6.0–8.3)

## 2012-12-17 LAB — CBC WITH DIFFERENTIAL/PLATELET
Basophils Absolute: 0.2 10*3/uL — ABNORMAL HIGH (ref 0.0–0.1)
Eosinophils Absolute: 1 10*3/uL — ABNORMAL HIGH (ref 0.0–0.7)
Eosinophils Relative: 5 % (ref 0–5)
HCT: 21.4 % — ABNORMAL LOW (ref 39.0–52.0)
Hemoglobin: 7.4 g/dL — ABNORMAL LOW (ref 13.0–17.0)
Lymphs Abs: 3.3 10*3/uL (ref 0.7–4.0)
MCHC: 34.6 g/dL (ref 30.0–36.0)
MCV: 90.3 fL (ref 78.0–100.0)
Monocytes Relative: 13 % — ABNORMAL HIGH (ref 3–12)
Neutro Abs: 13.4 10*3/uL — ABNORMAL HIGH (ref 1.7–7.7)
Neutrophils Relative %: 65 % (ref 43–77)
Platelets: 416 10*3/uL — ABNORMAL HIGH (ref 150–400)
RDW: 19 % — ABNORMAL HIGH (ref 11.5–15.5)
WBC: 20.6 10*3/uL — ABNORMAL HIGH (ref 4.0–10.5)

## 2012-12-17 MED ORDER — HYDROMORPHONE 0.3 MG/ML IV SOLN
INTRAVENOUS | Status: DC
  Administered 2012-12-17: 8.73 mg via INTRAVENOUS
  Administered 2012-12-17: 18:00:00 via INTRAVENOUS
  Administered 2012-12-17: 9.53 mg via INTRAVENOUS
  Administered 2012-12-17 (×2): via INTRAVENOUS
  Administered 2012-12-17: 10.49 mg via INTRAVENOUS
  Administered 2012-12-17 – 2012-12-18 (×2): via INTRAVENOUS
  Administered 2012-12-18: 7.36 mg via INTRAVENOUS
  Administered 2012-12-18: 7.5 mg via INTRAVENOUS
  Administered 2012-12-18: 8.47 mg via INTRAVENOUS
  Administered 2012-12-18: 3.62 mg via INTRAVENOUS
  Filled 2012-12-17 (×7): qty 25

## 2012-12-17 NOTE — Progress Notes (Signed)
Subjective: Patient still complaining of 8/10 pain. He has used 51.27mg  of Dilaudid in the last 24 hours with 80 demands. He is asking for more pain medicine. Also having itching as well as nausea. He also has been mainly bed-bound. No constipation, no NVD, no abdominal pain.  Objective: Vital signs in last 24 hours: Temp:  [97.5 F (36.4 C)-98.8 F (37.1 C)] 98.2 F (36.8 C) (11/17 0526) Pulse Rate:  [75-95] 75 (11/17 0526) Resp:  [13-20] 13 (11/17 0800) BP: (112-130)/(74-85) 125/80 mmHg (11/17 0526) SpO2:  [95 %-99 %] 97 % (11/17 0800) Weight:  [78.189 kg (172 lb 6 oz)-78.79 kg (173 lb 11.2 oz)] 78.79 kg (173 lb 11.2 oz) (11/17 0526) Weight change: -0.411 kg (-14.5 oz) Last BM Date: 12/15/12  Intake/Output from previous day: 11/16 0701 - 11/17 0700 In: 720 [P.O.:720] Out: 2300 [Urine:2300] Intake/Output this shift:    General appearance: alert, cooperative and no distress Eyes: conjunctivae/corneas clear. PERRL, EOM's intact. Fundi benign. Ears: normal TM's and external ear canals both ears Back: symmetric, no curvature. ROM normal. No CVA tenderness. Resp: clear to auscultation bilaterally Chest wall: no tenderness Cardio: regular rate and rhythm, S1, S2 normal, no murmur, click, rub or gallop GI: soft, non-tender; bowel sounds normal; no masses,  no organomegaly Extremities: extremities normal, atraumatic, no cyanosis or edema Pulses: 2+ and symmetric Skin: Skin color, texture, turgor normal. No rashes or lesions Neurologic: Grossly normal  Lab Results:  Recent Labs  12/16/12 0555 12/17/12 0515  WBC 23.5* 20.6*  HGB 7.5* 7.4*  HCT 21.7* 21.4*  PLT 417* 416*   BMET  Recent Labs  12/16/12 0555 12/17/12 0515  NA 138 142  K 3.7 4.2  CL 105 109  CO2 22 23  GLUCOSE 107* 129*  BUN 13 11  CREATININE 0.69 0.56  CALCIUM 8.7 8.9    Studies/Results: No results found.  Medications: I have reviewed the patient's current medications.  Assessment/Plan: A  32 year old gentleman with sickle cell painful crisis.  #1 sickle cell hemolytic crisis: His hemoglobin has stabilized. We will keep monitoring and hydrate patient. Seems no more active hemolysis.  #2 sickle cell painful crisis: Patient is currently on Dilaudid PCA and utilizing it a lot. We will increase his delivery amount per push to 1 mg. Continue other medications.  #3 history of thromboembolism: Patient will continue his Xarelto in the hospital.  #4 leukocytosis: This seems to be improving.  #5 hypertension: Mostly controlled.  #6 mood disorder: We will continue his home medications. He has mood swings here in the hospital.  LOS: 2 days   [name],[name] 12/17/2012, 10:15 AM

## 2012-12-18 DIAGNOSIS — R040 Epistaxis: Secondary | ICD-10-CM | POA: Diagnosis not present

## 2012-12-18 DIAGNOSIS — D72829 Elevated white blood cell count, unspecified: Secondary | ICD-10-CM | POA: Diagnosis not present

## 2012-12-18 DIAGNOSIS — M8700 Idiopathic aseptic necrosis of unspecified bone: Secondary | ICD-10-CM

## 2012-12-18 DIAGNOSIS — F411 Generalized anxiety disorder: Secondary | ICD-10-CM | POA: Diagnosis not present

## 2012-12-18 DIAGNOSIS — Z7901 Long term (current) use of anticoagulants: Secondary | ICD-10-CM | POA: Diagnosis not present

## 2012-12-18 DIAGNOSIS — I2699 Other pulmonary embolism without acute cor pulmonale: Secondary | ICD-10-CM | POA: Diagnosis not present

## 2012-12-18 DIAGNOSIS — D5700 Hb-SS disease with crisis, unspecified: Secondary | ICD-10-CM | POA: Diagnosis not present

## 2012-12-18 LAB — CBC WITH DIFFERENTIAL/PLATELET
Basophils Relative: 1 % (ref 0–1)
Eosinophils Absolute: 1.4 10*3/uL — ABNORMAL HIGH (ref 0.0–0.7)
HCT: 21.3 % — ABNORMAL LOW (ref 39.0–52.0)
Hemoglobin: 7.3 g/dL — ABNORMAL LOW (ref 13.0–17.0)
MCH: 31.2 pg (ref 26.0–34.0)
MCHC: 34.3 g/dL (ref 30.0–36.0)
Monocytes Absolute: 2.8 10*3/uL — ABNORMAL HIGH (ref 0.1–1.0)
Monocytes Relative: 14 % — ABNORMAL HIGH (ref 3–12)
Neutrophils Relative %: 63 % (ref 43–77)
RDW: 19.7 % — ABNORMAL HIGH (ref 11.5–15.5)

## 2012-12-18 LAB — PREPARE RBC (CROSSMATCH)

## 2012-12-18 MED ORDER — POLYETHYLENE GLYCOL 3350 17 G PO PACK
17.0000 g | PACK | Freq: Every day | ORAL | Status: DC | PRN
  Filled 2012-12-18: qty 1

## 2012-12-18 MED ORDER — HYDROMORPHONE 0.3 MG/ML IV SOLN
INTRAVENOUS | Status: DC
  Administered 2012-12-18: 17:00:00 via INTRAVENOUS
  Administered 2012-12-18: 3.49 mg via INTRAVENOUS
  Administered 2012-12-18: 22:00:00 via INTRAVENOUS
  Administered 2012-12-18: 6.84 mg via INTRAVENOUS
  Administered 2012-12-18: 9.97 mg via INTRAVENOUS
  Administered 2012-12-18: 5.8 mg via INTRAVENOUS
  Administered 2012-12-18 (×2): via INTRAVENOUS
  Administered 2012-12-19: 6.05 mg via INTRAVENOUS
  Administered 2012-12-19: 8.44 mg via INTRAVENOUS
  Administered 2012-12-19 (×2): via INTRAVENOUS
  Administered 2012-12-19: 5.86 mg via INTRAVENOUS
  Administered 2012-12-19 (×3): via INTRAVENOUS
  Administered 2012-12-19: 7.77 mg via INTRAVENOUS
  Administered 2012-12-19: 18:00:00 via INTRAVENOUS
  Administered 2012-12-19: 15.42 mg via INTRAVENOUS
  Administered 2012-12-20: 9.98 mg via INTRAVENOUS
  Administered 2012-12-20: 01:00:00 via INTRAVENOUS
  Administered 2012-12-20: 5.31 mg via INTRAVENOUS
  Administered 2012-12-20: 11:00:00 via INTRAVENOUS
  Administered 2012-12-20: 3.99 mg via INTRAVENOUS
  Administered 2012-12-20 (×2): via INTRAVENOUS
  Administered 2012-12-20: 3.19 mg via INTRAVENOUS
  Administered 2012-12-20: 5.59 mg via INTRAVENOUS
  Administered 2012-12-21: 16:00:00 via INTRAVENOUS
  Administered 2012-12-21: 7.19 mg via INTRAVENOUS
  Administered 2012-12-21: 21:00:00 via INTRAVENOUS
  Administered 2012-12-21: 2.94 mg via INTRAVENOUS
  Administered 2012-12-21: 5.59 mg via INTRAVENOUS
  Administered 2012-12-21: 10:00:00 via INTRAVENOUS
  Administered 2012-12-21: 0.799 mg via INTRAVENOUS
  Administered 2012-12-21: 5.09 mg via INTRAVENOUS
  Administered 2012-12-21: 3.58 mg via INTRAVENOUS
  Administered 2012-12-22: 19:00:00 via INTRAVENOUS
  Administered 2012-12-22: 13.7 mg via INTRAVENOUS
  Administered 2012-12-22 (×3): via INTRAVENOUS
  Administered 2012-12-22: 4.42 mg via INTRAVENOUS
  Administered 2012-12-22: 11:00:00 via INTRAVENOUS
  Administered 2012-12-22: 10.34 mg via INTRAVENOUS
  Administered 2012-12-23: 7.19 mg via INTRAVENOUS
  Administered 2012-12-23: 10:00:00 via INTRAVENOUS
  Administered 2012-12-23: 12.38 mg via INTRAVENOUS
  Administered 2012-12-23: 02:00:00 via INTRAVENOUS
  Administered 2012-12-23: 6.36 mg via INTRAVENOUS
  Administered 2012-12-23: 5.24 mg via INTRAVENOUS
  Administered 2012-12-23: 6.66 mg via INTRAVENOUS
  Administered 2012-12-23: 3.62 mg via INTRAVENOUS
  Administered 2012-12-23 – 2012-12-24 (×2): via INTRAVENOUS
  Administered 2012-12-24: 6.15 mg via INTRAVENOUS
  Administered 2012-12-24: 4.44 mg via INTRAVENOUS
  Administered 2012-12-24 (×3): via INTRAVENOUS
  Administered 2012-12-24: 6.38 mg via INTRAVENOUS
  Filled 2012-12-18 (×32): qty 25

## 2012-12-18 MED ORDER — HYDROMORPHONE HCL PF 2 MG/ML IJ SOLN
2.0000 mg | Freq: Once | INTRAMUSCULAR | Status: AC
  Administered 2012-12-18: 2 mg via INTRAVENOUS
  Filled 2012-12-18: qty 1

## 2012-12-18 MED ORDER — NALOXONE HCL 0.4 MG/ML IJ SOLN: 0.4000 mg | INTRAMUSCULAR | Status: DC | PRN

## 2012-12-18 MED ORDER — SENNOSIDES-DOCUSATE SODIUM 8.6-50 MG PO TABS
1.0000 | ORAL_TABLET | Freq: Two times a day (BID) | ORAL | Status: DC
  Administered 2012-12-18 – 2012-12-23 (×10): 1 via ORAL
  Filled 2012-12-18 (×18): qty 1

## 2012-12-18 MED ORDER — ONDANSETRON HCL 4 MG/2ML IJ SOLN
4.0000 mg | Freq: Four times a day (QID) | INTRAMUSCULAR | Status: DC | PRN
  Administered 2012-12-20: 4 mg via INTRAVENOUS
  Filled 2012-12-18: qty 2

## 2012-12-18 MED ORDER — DIPHENHYDRAMINE HCL 25 MG PO CAPS
25.0000 mg | ORAL_CAPSULE | Freq: Four times a day (QID) | ORAL | Status: DC | PRN
  Administered 2012-12-18 – 2012-12-19 (×3): 25 mg via ORAL
  Administered 2012-12-19 – 2012-12-20 (×2): 50 mg via ORAL
  Administered 2012-12-20 – 2012-12-21 (×2): 25 mg via ORAL
  Filled 2012-12-18 (×3): qty 2
  Filled 2012-12-18 (×4): qty 1

## 2012-12-18 MED ORDER — DEXTROSE-NACL 5-0.45 % IV SOLN
INTRAVENOUS | Status: AC
  Administered 2012-12-18 – 2012-12-19 (×3): via INTRAVENOUS

## 2012-12-18 MED ORDER — SODIUM CHLORIDE 0.9 % IJ SOLN
9.0000 mL | INTRAMUSCULAR | Status: DC | PRN
  Administered 2012-12-24: 16:00:00 via INTRAVENOUS

## 2012-12-18 NOTE — Progress Notes (Signed)
SICKLE CELL SERVICE PROGRESS NOTE  [name] [name] DOB: 1980/11/17 DOA: 12/15/2012 PCP: Gerald Toste A., MD  Assessment/Plan: Active Problems:    1. Hb SS with crisis: Pt is somewhat anxious this morning. Engaged in some  Guided imagery and deep breathing and instructed patient to engage every 1-2 hours while awake. Pt has used 46 mg  Of Dilaudid over the last 24 hours. I believe that some of his pain is due to anxiety. I will continue MS Contin and Toradol and decrease PCA to 0.7 mg for each bolus.  Will restart IVF as patient's oral intake of fluids has been poor. 2. Anxiety: Pt exhibiting anxiety surrounding his pain and pain management. Will order a small dose of Xanax on a PRN basis. Also instructed patient in guided imagery and relaxation breathing. Will ask nursing to employ aroma therapy. 3. Anemia: Pt has evidence of acute hemolysis and decrease in Hb Pt also has epistaxis 2 nights ago which has likely contributed to decrease in Hb . Will reassess this evening and make a decision on transfusion. 4. Leukocytosis; Pt shows no signs of infection. I suspect that his elevated WBC is associated with his crisis. 5. Epistaxis: Pt has had a self limiting bout of epistaxis. There is no current evidence of bleeding. He is on Xarelto for Secondary prevention of pulmonary Embolus. Will continue.  Code Status: full Code Family Communication: N/A Disposition Plan: HOme in 48 hours  [name] A.  Pager [phone]. If 7PM-7AM, please contact night-coverage.  12/18/2012, 12:52 PM  LOS: 3 days   Brief narrative: [name] is a 32 y.o. male  Who was d/c'd from the sickle cell center on 11/10. He was doing ok up to yesterday, when he developed pain not resolved with his home medications. He came to the ER, was given IV pain meds with no relief. He denies fever, chills. Pain is similar to his previous crises. Increased bili and  retic   Consultants:  None  Procedures:  None  Antibiotics:  None  HPI/Subjective: Pt states that pain is localized mostly to left ribcage and is currently a 6/10. He also is visibly anxious and rocking in pain. Pt is anxious that his pain will not be managed. We have  Gone through some deep breathing and guided imagery techniques and pt states that he already feels calmer. Pt had a BM last night.  Objective: Filed Vitals:   12/18/12 0833 12/18/12 1000 12/18/12 1014 12/18/12 1200  BP:  142/90    Pulse:  78    Temp:  98.4 F (36.9 C)    TempSrc:  Oral    Resp: 15 18 16 16   Height:      Weight:      SpO2: 99% 95% 99% 98%   Weight change: 3 lb 2 oz (1.417 kg)  Intake/Output Summary (Last 24 hours) at 12/18/12 1252 Last data filed at 12/18/12 1200  Gross per 24 hour  Intake    840 ml  Output   3175 ml  Net  -2335 ml    General: Alert, awake, oriented x3, in no acute distress.  HEENT: [place]/AT PEERL, EOMI. mild icterus (more than usual baseline). Heart: Regular rate and rhythm, without murmurs, rubs, gallops.  Lungs: Clear to auscultation, no wheezing or rhonchi noted. No pleuritic pain. Abdomen: Soft, nontender, nondistended, positive bowel sounds, no masses no hepatosplenomegaly noted.  Neuro: No focal neurological deficits noted cranial nerves II through XII grossly intact.  Strength functional in bilateral upper  and lower extremities. Musculoskeletal: No warm swelling or erythema around joints, no spinal tenderness noted. Psychiatric: Patient alert and oriented x3, good insight and cognition, good recent to remote recall.  Data Reviewed: Basic Metabolic Panel:  Recent Labs Lab 12/15/12 1825 12/16/12 0555 12/17/12 0515 12/17/12 1115  NA 137 138 142 141  K 3.8 3.7 4.2 4.1  CL 102 105 109 109  CO2 25 22 23 25   GLUCOSE 111* 107* 129* 100*  BUN 9 13 11 11   CREATININE 0.68 0.69 0.56 0.60  CALCIUM 8.8 8.7 8.9 8.9  MG  --  1.7  --   --    Liver Function  Tests:  Recent Labs Lab 12/15/12 1825 12/16/12 0555 12/17/12 0515 12/17/12 1115  AST 41* 38* 33 33  ALT 31 28 21 22   ALKPHOS 97 102 95 95  BILITOT 7.6* 8.1* 7.0* 7.2*  PROT 7.7 7.7 7.5 7.4  ALBUMIN 4.2 4.2 4.1 4.1   No results found for this basename: LIPASE, AMYLASE,  in the last 168 hours No results found for this basename: AMMONIA,  in the last 168 hours CBC:  Recent Labs Lab 12/15/12 1825 12/16/12 0555 12/17/12 0515 12/18/12 0515  WBC 18.7* 23.5* 20.6* 19.5*  NEUTROABS 10.3* 12.8* 13.4* 12.2*  HGB 7.8* 7.5* 7.4* 7.3*  HCT 22.8* 21.7* 21.4* 21.3*  MCV 90.1 90.4 90.3 91.0  PLT 432* 417* 416* 390   Cardiac Enzymes: No results found for this basename: CKTOTAL, CKMB, CKMBINDEX, TROPONINI,  in the last 168 hours BNP (last 3 results)  Recent Labs  11/13/12 1826  PROBNP 106.3   CBG: No results found for this basename: GLUCAP,  in the last 168 hours  No results found for this or any previous visit (from the past 240 hour(s)).   Studies: No results found.  Scheduled Meds: . folic acid  1 mg Oral q morning - 10a  . HYDROmorphone PCA 0.3 mg/mL   Intravenous Q4H  . hydroxyurea  1,000 mg Oral Daily  . ketorolac  30 mg Intravenous Q6H  . morphine  15 mg Oral BID  . potassium chloride SA  20 mEq Oral q morning - 10a  . Rivaroxaban  20 mg Oral q morning - 10a  . senna-docusate  1 tablet Oral BID  . sodium chloride  3 mL Intravenous Q12H   Continuous Infusions:   Total time spent in decision making and face to face time was 40 minutes.

## 2012-12-19 DIAGNOSIS — I2699 Other pulmonary embolism without acute cor pulmonale: Secondary | ICD-10-CM | POA: Diagnosis not present

## 2012-12-19 DIAGNOSIS — Z7901 Long term (current) use of anticoagulants: Secondary | ICD-10-CM | POA: Diagnosis not present

## 2012-12-19 DIAGNOSIS — D5700 Hb-SS disease with crisis, unspecified: Secondary | ICD-10-CM | POA: Diagnosis not present

## 2012-12-19 DIAGNOSIS — R040 Epistaxis: Secondary | ICD-10-CM | POA: Diagnosis not present

## 2012-12-19 DIAGNOSIS — D72829 Elevated white blood cell count, unspecified: Secondary | ICD-10-CM | POA: Diagnosis not present

## 2012-12-19 DIAGNOSIS — F411 Generalized anxiety disorder: Secondary | ICD-10-CM | POA: Diagnosis not present

## 2012-12-19 LAB — COMPREHENSIVE METABOLIC PANEL
AST: 28 U/L (ref 0–37)
Albumin: 4 g/dL (ref 3.5–5.2)
Alkaline Phosphatase: 94 U/L (ref 39–117)
BUN: 14 mg/dL (ref 6–23)
CO2: 23 mEq/L (ref 19–32)
Chloride: 107 mEq/L (ref 96–112)
Creatinine, Ser: 0.64 mg/dL (ref 0.50–1.35)
GFR calc Af Amer: >90 mL/min (ref >90)
Total Bilirubin: 5 mg/dL — ABNORMAL HIGH (ref 0.3–1.2)
Total Protein: 7.5 g/dL (ref 6.0–8.3)

## 2012-12-19 LAB — CBC WITH DIFFERENTIAL/PLATELET
Basophils Absolute: 0.2 10*3/uL — ABNORMAL HIGH (ref 0.0–0.1)
Basophils Relative: 1 % (ref 0–1)
Eosinophils Relative: 8 % — ABNORMAL HIGH (ref 0–5)
Lymphocytes Relative: 22 % (ref 12–46)
Lymphs Abs: 4.2 10*3/uL — ABNORMAL HIGH (ref 0.7–4.0)
MCH: 30.6 pg (ref 26.0–34.0)
MCHC: 34.2 g/dL (ref 30.0–36.0)
Monocytes Absolute: 2.3 10*3/uL — ABNORMAL HIGH (ref 0.1–1.0)
Monocytes Relative: 12 % (ref 3–12)
Neutrophils Relative %: 57 % (ref 43–77)
Platelets: 372 10*3/uL (ref 150–400)
RBC: 2.48 MIL/uL — ABNORMAL LOW (ref 4.22–5.81)
nRBC: 3 /100 WBC — ABNORMAL HIGH

## 2012-12-19 MED ORDER — HYDROMORPHONE HCL PF 1 MG/ML IJ SOLN
1.0000 mg | INTRAMUSCULAR | Status: DC | PRN
  Administered 2012-12-19 (×2): 2 mg via INTRAVENOUS
  Administered 2012-12-19: 1 mg via INTRAVENOUS
  Administered 2012-12-19 – 2012-12-20 (×7): 2 mg via INTRAVENOUS
  Filled 2012-12-19 (×10): qty 2

## 2012-12-19 MED ORDER — HYDROMORPHONE HCL PF 1 MG/ML IJ SOLN
1.0000 mg | INTRAMUSCULAR | Status: DC | PRN
  Administered 2012-12-19: 1 mg via INTRAVENOUS
  Filled 2012-12-19: qty 1

## 2012-12-19 NOTE — Progress Notes (Signed)
PCA 24h usage: 44.67mg  delivered 156 demands 62 deliveries

## 2012-12-19 NOTE — Progress Notes (Signed)
Subjective: Patient still complaining of 7/10 pain. He has used 44.07mg  of Dilaudid in the last 24 hours with 150 demands and 62 deliveries.Marland Kitchen He is asking for more pain medicine again. He is still on Dilaudid PCA pump. He has had a dose of IV Dilaudid in between his PCA dose and for breakthrough pain. He seems to have had significant hemolysis in the last couple of days and is having more pain from that. He has gotten better yesterday with relaxation therapy in addition to his current medications. He is however still depressed and feeling that his pain is not adequately controlled.  Objective: Vital signs in last 24 hours: Temp:  [98 F (36.7 C)-98.8 F (37.1 C)] 98 F (36.7 C) (11/19 0704) Pulse Rate:  [77-108] 78 (11/19 0704) Resp:  [14-23] 20 (11/19 1127) BP: (124-143)/(64-92) 124/90 mmHg (11/19 0704) SpO2:  [97 %-100 %] 99 % (11/19 1127) Weight:  [80.1 kg (176 lb 9.4 oz)] 80.1 kg (176 lb 9.4 oz) (11/19 0704) Weight change:  Last BM Date: 12/18/12  Intake/Output from previous day: 11/18 0701 - 11/19 0700 In: 2480 [P.O.:960; I.V.:1202.5; Blood:317.5] Out: 3425 [Urine:3425] Intake/Output this shift:    General appearance: alert, cooperative and no distress Eyes: conjunctivae/corneas clear. PERRL, EOM's intact. Fundi benign. Ears: normal TM's and external ear canals both ears Back: symmetric, no curvature. ROM normal. No CVA tenderness. Resp: clear to auscultation bilaterally Chest wall: no tenderness Cardio: regular rate and rhythm, S1, S2 normal, no murmur, click, rub or gallop GI: soft, non-tender; bowel sounds normal; no masses,  no organomegaly Extremities: extremities normal, atraumatic, no cyanosis or edema Pulses: 2+ and symmetric Skin: Skin color, texture, turgor normal. No rashes or lesions Neurologic: Grossly normal  Lab Results:  Recent Labs  12/18/12 0515 12/19/12 0447  WBC 19.5* 19.0*  HGB 7.3* 7.6*  HCT 21.3* 22.2*  PLT 390 372   BMET  Recent Labs  12/17/12 1115 12/19/12 0447  NA 141 139  K 4.1 4.1  CL 109 107  CO2 25 23  GLUCOSE 100* 110*  BUN 11 14  CREATININE 0.60 0.64  CALCIUM 8.9 8.9    Studies/Results: No results found.  Medications: I have reviewed the patient's current medications.  Assessment/Plan: A 32 year old gentleman with sickle cell painful crisis.  #1 sickle cell hemolytic crisis: Patient has had significant hemolysis this admission but that seems to have ended. He is now suffering from the aftermath of that crisis. We will keep monitoring his H&H.  #2 sickle cell painful crisis: We will continue his current dose of Dilaudid PCA. I'll also add IV Dilaudid for breakthrough pain. I'll give him 1-2 mg every 2 hours as needed.  #3 history of thromboembolism: Patient is on Xarelto.  #4 leukocytosis: Stable  #5 hypertension: Controlled.  #6 mood disorder: He seemed to have responded to compensations as well as simple psychotherapy sessions. Continue home medications.  LOS: 4 days   Imanni Burdine,[name] 12/19/2012, 12:11 PM

## 2012-12-20 ENCOUNTER — Ambulatory Visit: Payer: Medicare Other | Admitting: Internal Medicine

## 2012-12-20 DIAGNOSIS — D571 Sickle-cell disease without crisis: Secondary | ICD-10-CM | POA: Diagnosis not present

## 2012-12-20 DIAGNOSIS — I2699 Other pulmonary embolism without acute cor pulmonale: Secondary | ICD-10-CM | POA: Diagnosis not present

## 2012-12-20 DIAGNOSIS — Z7901 Long term (current) use of anticoagulants: Secondary | ICD-10-CM | POA: Diagnosis not present

## 2012-12-20 DIAGNOSIS — F411 Generalized anxiety disorder: Secondary | ICD-10-CM | POA: Diagnosis not present

## 2012-12-20 DIAGNOSIS — D72829 Elevated white blood cell count, unspecified: Secondary | ICD-10-CM | POA: Diagnosis not present

## 2012-12-20 DIAGNOSIS — D5700 Hb-SS disease with crisis, unspecified: Secondary | ICD-10-CM | POA: Diagnosis not present

## 2012-12-20 DIAGNOSIS — R040 Epistaxis: Secondary | ICD-10-CM | POA: Diagnosis not present

## 2012-12-20 LAB — COMPREHENSIVE METABOLIC PANEL
ALT: 20 U/L (ref 0–53)
AST: 31 U/L (ref 0–37)
BUN: 12 mg/dL (ref 6–23)
CO2: 23 mEq/L (ref 19–32)
Chloride: 108 mEq/L (ref 96–112)
Creatinine, Ser: 0.67 mg/dL (ref 0.50–1.35)
GFR calc Af Amer: >90 mL/min (ref >90)
GFR calc non Af Amer: >90 mL/min (ref >90)
Sodium: 140 mEq/L (ref 135–145)
Total Bilirubin: 4 mg/dL — ABNORMAL HIGH (ref 0.3–1.2)

## 2012-12-20 LAB — CBC WITH DIFFERENTIAL/PLATELET
Eosinophils Relative: 8 % — ABNORMAL HIGH (ref 0–5)
HCT: 20.5 % — ABNORMAL LOW (ref 39.0–52.0)
Hemoglobin: 7.2 g/dL — ABNORMAL LOW (ref 13.0–17.0)
Lymphs Abs: 3.6 10*3/uL (ref 0.7–4.0)
MCV: 88.4 fL (ref 78.0–100.0)
Monocytes Relative: 12 % (ref 3–12)
Neutro Abs: 9.7 10*3/uL — ABNORMAL HIGH (ref 1.7–7.7)
Platelets: 361 10*3/uL (ref 150–400)
RBC: 2.32 MIL/uL — ABNORMAL LOW (ref 4.22–5.81)
WBC: 16.9 10*3/uL — ABNORMAL HIGH (ref 4.0–10.5)

## 2012-12-20 MED ORDER — HYDROMORPHONE HCL PF 1 MG/ML IJ SOLN
1.0000 mg | INTRAMUSCULAR | Status: DC | PRN
  Administered 2012-12-20 – 2012-12-22 (×11): 1 mg via INTRAVENOUS
  Filled 2012-12-20 (×11): qty 1

## 2012-12-20 MED ORDER — HYDROMORPHONE HCL PF 1 MG/ML IJ SOLN
1.0000 mg | INTRAMUSCULAR | Status: DC | PRN
  Administered 2012-12-20 (×3): 1 mg via INTRAVENOUS
  Filled 2012-12-20 (×3): qty 1

## 2012-12-20 NOTE — Progress Notes (Signed)
SICKLE CELL SERVICE PROGRESS NOTE  [name]:[id number] DOB: January 16, 1981 DOA: 12/15/2012 PCP: Aine Strycharz A., MD  Assessment/Plan: Active Problems:    1. Hb SS with crisis: Pt is much less anxious today and is still rating hias pain at 6-7/10 mostly localized to the right thigh. He has used 43.9 mg of Dilaudid in the last 24 hours with 63 demands and 57 deliveries. He has a component of central sensitization which is contributing to his experience of pain. He has also had significant hemolysis during this hospitalization leading to anemia. Will transfuse 1 unit of PRBC's. I am decreasing his breakthrough dose to 1 mg every 2 hours and plan to continue to de-escalate his therapy in the next 24 hours. I anticipate discharge in the next 48 hours. 2.  Anxiety: Pt is much less anxious today. He has a component of stress contributing to his condition but has no-one whom he trusts with whom he can speak. I have offered referral to counseling services but patient remains reluctant to pursue as he has issues with trust.  3. Anemia: Pt has evidence of acute hemolysis and decrease in Hb Will transfuse 1 unit of PRBC's' today. Unsure if patient taking Hydrea and Folic acid on a regular basis as MCV is not reflective of Hydrea compliance. 4. Leukocytosis; Pt shows no signs of infection. I suspect that his elevated WBC is associated with his crisis. Epistaxis: Pt has had a self limiting bout of epistaxis 2 nights ago. No further epistaxis. Code Status: full Code Family Communication: N/A Disposition Plan: Hqme in 48 hours  Jamarkis Branam A.  Pager [phone]. If 7PM-7AM, please contact night-coverage.  12/20/2012, 12:06 PM  LOS: 5 days   Brief narrative: [name] is a 32 y.o. male  Who was d/c'd from the [facility] cell center on 11/10. He was doing ok up to yesterday, when he developed pain not resolved with his home medications. He came to the ER, was given IV pain meds with no  relief. He denies fever, chills. Pain is similar to his previous crises. Increased bili and retic   Consultants:  None  Procedures:  None  Antibiotics:  None  HPI/Subjective: Pt states that pain is localized mostly to right thigh and is currently a 6/10. He  is much less anxious today. He reports that he is stressed out by the relationshipsin his life.  Objective: Filed Vitals:   12/20/12 0527 12/20/12 0742 12/20/12 1030 12/20/12 1150  BP: 130/79  133/79   Pulse: 72  82   Temp: 97.9 F (36.6 C)  98.4 F (36.9 C)   TempSrc: Oral  Oral   Resp: 17 21 16 19   Height:      Weight:      SpO2: 99% 100% 99% 100%   Weight change:   Intake/Output Summary (Last 24 hours) at 12/20/12 1206 Last data filed at 12/20/12 1030  Gross per 24 hour  Intake   4402 ml  Output   2775 ml  Net   1627 ml    General: Alert, awake, oriented x3, in no acute distress.  HEENT: [place]/AT PEERL, EOMI. Less icteric today. Heart: Regular rate and rhythm, without murmurs, rubs, gallops.  Lungs: Clear to auscultation, no wheezing or rhonchi noted. No pleuritic pain. Abdomen: Soft, nontender, nondistended, positive bowel sounds, no masses no hepatosplenomegaly noted.  Neuro: No focal neurological deficits noted cranial nerves II through XII grossly intact.  Strength functional in bilateral upper and lower extremities. Musculoskeletal: No warm swelling  or erythema around joints, no spinal tenderness noted. Psychiatric: Patient alert and oriented x3, good insight and cognition, good recent to remote recall. Appears stressed.  Data Reviewed: Basic Metabolic Panel:  Recent Labs Lab 12/16/12 0555 12/17/12 0515 12/17/12 1115 12/19/12 0447 12/20/12 0431  NA 138 142 141 139 140  K 3.7 4.2 4.1 4.1 4.0  CL 105 109 109 107 108  CO2 22 23 25 23 23   GLUCOSE 107* 129* 100* 110* 110*  BUN 13 11 11 14 12   CREATININE 0.69 0.56 0.60 0.64 0.67  CALCIUM 8.7 8.9 8.9 8.9 8.7  MG 1.7  --   --   --   --    Liver  Function Tests:  Recent Labs Lab 12/16/12 0555 12/17/12 0515 12/17/12 1115 12/19/12 0447 12/20/12 0431  AST 38* 33 33 28 31  ALT 28 21 22 19 20   ALKPHOS 102 95 95 94 90  BILITOT 8.1* 7.0* 7.2* 5.0* 4.0*  PROT 7.7 7.5 7.4 7.5 7.3  ALBUMIN 4.2 4.1 4.1 4.0 4.0   No results found for this basename: LIPASE, AMYLASE,  in the last 168 hours No results found for this basename: AMMONIA,  in the last 168 hours CBC:  Recent Labs Lab 12/16/12 0555 12/17/12 0515 12/18/12 0515 12/19/12 0447 12/20/12 0431  WBC 23.5* 20.6* 19.5* 19.0* 16.9*  NEUTROABS 12.8* 13.4* 12.2* 10.8* 9.7*  HGB 7.5* 7.4* 7.3* 7.6* 7.2*  HCT 21.7* 21.4* 21.3* 22.2* 20.5*  MCV 90.4 90.3 91.0 89.5 88.4  PLT 417* 416* 390 372 361   Cardiac Enzymes: No results found for this basename: CKTOTAL, CKMB, CKMBINDEX, TROPONINI,  in the last 168 hours BNP (last 3 results)  Recent Labs  11/13/12 1826  PROBNP 106.3   CBG: No results found for this basename: GLUCAP,  in the last 168 hours  No results found for this or any previous visit (from the past 240 hour(s)).   Studies: No results found.  Scheduled Meds: . folic acid  1 mg Oral q morning - 10a  . HYDROmorphone PCA 0.3 mg/mL   Intravenous Q4H  . hydroxyurea  1,000 mg Oral Daily  . ketorolac  30 mg Intravenous Q6H  . morphine  15 mg Oral BID  . potassium chloride SA  20 mEq Oral q morning - 10a  . Rivaroxaban  20 mg Oral q morning - 10a  . senna-docusate  1 tablet Oral BID  . sodium chloride  3 mL Intravenous Q12H    Total time spent in decision making and face to face time was 45 minutes.

## 2012-12-20 NOTE — Progress Notes (Signed)
PCA use last 24h 43.96mg  used 63 demands 57 deliveries

## 2012-12-20 NOTE — Plan of Care (Signed)
Problem: Phase I Progression Outcomes Goal: Pain controlled with appropriate interventions Outcome: Progressing PRN Dilaudid added to pain interventions 11/19

## 2012-12-20 NOTE — Progress Notes (Signed)
Evaluated patient for [facility] Care Management services. Spoke with patient at bedside to discuss [facility] Care Management services. Currently he does not have any [facility] Care Management needs. Left contact information at bedside.  Raiford Noble, MSN- Ed, RN,BSN- [facility] [phone]

## 2012-12-21 DIAGNOSIS — D5700 Hb-SS disease with crisis, unspecified: Secondary | ICD-10-CM | POA: Diagnosis not present

## 2012-12-21 DIAGNOSIS — R040 Epistaxis: Secondary | ICD-10-CM | POA: Diagnosis not present

## 2012-12-21 DIAGNOSIS — I2699 Other pulmonary embolism without acute cor pulmonale: Secondary | ICD-10-CM | POA: Diagnosis not present

## 2012-12-21 DIAGNOSIS — F411 Generalized anxiety disorder: Secondary | ICD-10-CM | POA: Diagnosis not present

## 2012-12-21 DIAGNOSIS — D72829 Elevated white blood cell count, unspecified: Secondary | ICD-10-CM | POA: Diagnosis not present

## 2012-12-21 DIAGNOSIS — Z7901 Long term (current) use of anticoagulants: Secondary | ICD-10-CM | POA: Diagnosis not present

## 2012-12-21 LAB — TYPE AND SCREEN
ABO/RH(D): O POS
Antibody Screen: NEGATIVE
Unit division: 0
Unit division: 0

## 2012-12-21 LAB — CBC WITH DIFFERENTIAL/PLATELET
Basophils Absolute: 0.2 10*3/uL — ABNORMAL HIGH (ref 0.0–0.1)
Basophils Relative: 1 % (ref 0–1)
Eosinophils Absolute: 1.3 10*3/uL — ABNORMAL HIGH (ref 0.0–0.7)
Lymphocytes Relative: 21 % (ref 12–46)
Lymphs Abs: 2.8 10*3/uL (ref 0.7–4.0)
MCH: 30 pg (ref 26.0–34.0)
MCHC: 34.4 g/dL (ref 30.0–36.0)
Monocytes Relative: 13 % — ABNORMAL HIGH (ref 3–12)
Neutro Abs: 7.4 10*3/uL (ref 1.7–7.7)
Neutrophils Relative %: 56 % (ref 43–77)
Platelets: 353 10*3/uL (ref 150–400)
RBC: 2.5 MIL/uL — ABNORMAL LOW (ref 4.22–5.81)
RDW: 17.9 % — ABNORMAL HIGH (ref 11.5–15.5)

## 2012-12-22 DIAGNOSIS — Z7901 Long term (current) use of anticoagulants: Secondary | ICD-10-CM | POA: Diagnosis not present

## 2012-12-22 DIAGNOSIS — F411 Generalized anxiety disorder: Secondary | ICD-10-CM | POA: Diagnosis not present

## 2012-12-22 DIAGNOSIS — R040 Epistaxis: Secondary | ICD-10-CM | POA: Diagnosis not present

## 2012-12-22 DIAGNOSIS — I2699 Other pulmonary embolism without acute cor pulmonale: Secondary | ICD-10-CM | POA: Diagnosis not present

## 2012-12-22 DIAGNOSIS — D5700 Hb-SS disease with crisis, unspecified: Secondary | ICD-10-CM | POA: Diagnosis not present

## 2012-12-22 DIAGNOSIS — D72829 Elevated white blood cell count, unspecified: Secondary | ICD-10-CM | POA: Diagnosis not present

## 2012-12-22 LAB — BASIC METABOLIC PANEL
CO2: 23 mEq/L (ref 19–32)
Chloride: 101 mEq/L (ref 96–112)
Creatinine, Ser: 0.51 mg/dL (ref 0.50–1.35)
Glucose, Bld: 104 mg/dL — ABNORMAL HIGH (ref 70–99)
Potassium: 3.7 mEq/L (ref 3.5–5.1)
Sodium: 134 mEq/L — ABNORMAL LOW (ref 135–145)

## 2012-12-22 LAB — CBC
Hemoglobin: 8.5 g/dL — ABNORMAL LOW (ref 13.0–17.0)
MCH: 29.9 pg (ref 26.0–34.0)
Platelets: 401 10*3/uL — ABNORMAL HIGH (ref 150–400)
RBC: 2.84 MIL/uL — ABNORMAL LOW (ref 4.22–5.81)
WBC: 13.9 10*3/uL — ABNORMAL HIGH (ref 4.0–10.5)

## 2012-12-22 LAB — RETICULOCYTES
Retic Count, Absolute: 150.5 10*3/uL (ref 19.0–186.0)
Retic Ct Pct: 5.3 % — ABNORMAL HIGH (ref 0.4–3.1)

## 2012-12-22 LAB — LACTATE DEHYDROGENASE: LDH: 497 U/L — ABNORMAL HIGH (ref 94–250)

## 2012-12-22 NOTE — Progress Notes (Addendum)
Subjective: Patient seen, states his pain finally started improving yesterday, pain went down to a 6/10. States he is beginning to feel better. Pain remains in the back, shoulders and legs. He remains on a dilaudid PCA pump. He used a total of 20 mg between 12 noon and 8:30AM today, he has additionally requested his q3hr PRN dilaudid each time.   Objective: Weight change: -3.946 kg (-8 lb 11.2 oz)  Intake/Output Summary (Last 24 hours) at 12/22/12 1191 Last data filed at 12/21/12 1900  Gross per 24 hour  Intake    600 ml  Output   2150 ml  Net  -1550 ml   BP 127/83  Pulse 76  Temp(Src) 98.1 F (36.7 C) (Oral)  Resp 16  Ht 6' (1.829 m)  Wt 77.293 kg (170 lb 6.4 oz)  BMI 23.11 kg/m2  SpO2 99%  General Appearance:    Alert, cooperative, no distress, appears stated age  Head:    Normocephalic, without obvious abnormality, atraumatic  Eyes:    PERRL, conjunctiva/corneas clear      Nose:   Nares normal, septum midline, mucosa normal, no drainage    or sinus tenderness  Throat:   Lips, mucosa, and tongue normal; teeth and gums normal  Neck:   Supple, symmetrical, trachea midline, no adenopathy;       thyroid:  No enlargement/tenderness/nodules; no carotid   bruit or JVD  Back:     Symmetric, no curvature, ROM normal, no CVA tenderness  Lungs:     Clear to auscultation bilaterally, respirations unlabored  Chest wall:    No tenderness or deformity, mediport left chest wall  Heart:    Regular rate and rhythm, S1 and S2 normal, no murmur, rub   or gallop  Abdomen:     Soft, non-tender, bowel sounds active all four quadrants,    no masses, no organomegaly        Extremities:   Extremities normal, atraumatic, no cyanosis or edema  Pulses:   2+ and symmetric all extremities  Skin:   Skin color, texture, turgor normal, no rashes or lesions  Lymph nodes:   Cervical, supraclavicular, and axillary nodes normal  Neurologic:   CNII-XII intact. Normal strength, sensation and reflexes     throughout    Lab Results:  Recent Labs  12/20/12 0431  NA 140  K 4.0  CL 108  CO2 23  GLUCOSE 110*  BUN 12  CREATININE 0.67  CALCIUM 8.7    Recent Labs  12/20/12 0431  AST 31  ALT 20  ALKPHOS 90  BILITOT 4.0*  PROT 7.3  ALBUMIN 4.0   No results found for this basename: LIPASE, AMYLASE,  in the last 72 hours  Recent Labs  12/20/12 0431 12/21/12 0525  WBC 16.9* 13.3*  NEUTROABS 9.7* 7.4  HGB 7.2* 7.5*  HCT 20.5* 21.8*  MCV 88.4 87.2  PLT 361 353   No results found for this basename: CKTOTAL, CKMB, CKMBINDEX, TROPONINI,  in the last 72 hours No components found with this basename: POCBNP,  No results found for this basename: DDIMER,  in the last 72 hours No results found for this basename: HGBA1C,  in the last 72 hours No results found for this basename: CHOL, HDL, LDLCALC, TRIG, CHOLHDL, LDLDIRECT,  in the last 72 hours No results found for this basename: TSH, T4TOTAL, FREET3, T3FREE, THYROIDAB,  in the last 72 hours No results found for this basename: VITAMINB12, FOLATE, FERRITIN, TIBC, IRON, RETICCTPCT,  in the last 72  hours  Micro Results: No results found for this or any previous visit (from the past 240 hour(s)).  Studies/Results: No results found. Medications: Scheduled Meds: . folic acid  1 mg Oral q morning - 10a  . HYDROmorphone PCA 0.3 mg/mL   Intravenous Q4H  . hydroxyurea  1,000 mg Oral Daily  . morphine  15 mg Oral BID  . potassium chloride SA  20 mEq Oral q morning - 10a  . Rivaroxaban  20 mg Oral q morning - 10a  . senna-docusate  1 tablet Oral BID  . sodium chloride  3 mL Intravenous Q12H   Continuous Infusions:  PRN Meds:.diphenhydrAMINE, HYDROmorphone (DILAUDID) injection, naloxone, ondansetron (ZOFRAN) IV, polyethylene glycol, sodium chloride, sodium chloride, zolpidem  Assessment/Plan: @PROBHOSP @  LOS: 7 days   Hb SS with crisis: Pain improving. Will discontinue PRN dilaudid, continue PCA. Resume all home medications.  Re-evalauate in AM for possible discharge. Anemia: Pt had evidence of acute hemolysis, transfused 1 unit of PRBC, H/H stable. Unsure if patient taking Hydrea and Folic acid on a regular basis as MCV is not reflective of Hydrea compliance. Leukocytosis: No signs of infection, elevated WBC likely due to vaso-occlusive crisis.       Epistaxis: Resolved last 4 days.       Pulmonary Embolism: continue Xarelto       Mood disorder/Anxiety: discussion and encouragement underway. Patient remains resistant to seeking                       outpatient therapy       Opoid dependence: continue home medications       Code Status: full Code  Family Communication: N/A  Disposition Plan: Home in 24-48 hours   Ian Castagna 12/22/2012, 8:42 AM

## 2012-12-23 DIAGNOSIS — D72829 Elevated white blood cell count, unspecified: Secondary | ICD-10-CM | POA: Diagnosis not present

## 2012-12-23 DIAGNOSIS — R040 Epistaxis: Secondary | ICD-10-CM | POA: Diagnosis not present

## 2012-12-23 DIAGNOSIS — F411 Generalized anxiety disorder: Secondary | ICD-10-CM | POA: Diagnosis not present

## 2012-12-23 DIAGNOSIS — D5700 Hb-SS disease with crisis, unspecified: Secondary | ICD-10-CM | POA: Diagnosis not present

## 2012-12-23 DIAGNOSIS — Z7901 Long term (current) use of anticoagulants: Secondary | ICD-10-CM | POA: Diagnosis not present

## 2012-12-23 DIAGNOSIS — I2699 Other pulmonary embolism without acute cor pulmonale: Secondary | ICD-10-CM | POA: Diagnosis not present

## 2012-12-23 MED ORDER — HYDROMORPHONE HCL PF 1 MG/ML IJ SOLN
1.0000 mg | INTRAMUSCULAR | Status: DC | PRN
  Administered 2012-12-23 – 2012-12-24 (×10): 1 mg via INTRAVENOUS
  Filled 2012-12-23 (×10): qty 1

## 2012-12-23 NOTE — Progress Notes (Signed)
Subjective: Patient seen, states pain level unchanged in last 24 hours but pain less intense. He used 47mg  dilaudid from the PCA in last 24 hours. He appears fairly comfortable.  Objective: Weight change:   Intake/Output Summary (Last 24 hours) at 12/23/12 1343 Last data filed at 12/23/12 1144  Gross per 24 hour  Intake   1080 ml  Output   2950 ml  Net  -1870 ml   BP 127/81  Pulse 90  Temp(Src) 98.4 F (36.9 C) (Oral)  Resp 16  Ht 6' (1.829 m)  Wt 77.293 kg (170 lb 6.4 oz)  BMI 23.11 kg/m2  SpO2 97%  General Appearance:    Alert, cooperative, no distress, appears stated age  Head:    Normocephalic, without obvious abnormality, atraumatic  Eyes:    PERRL, conjunctiva/corneas clear     Nose:   Nares normal, septum midline, mucosa normal, no drainage    or sinus tenderness  Throat:   Lips, mucosa, and tongue normal; teeth and gums normal  Neck:   Supple, symmetrical, trachea midline, no adenopathy;       thyroid:  No enlargement/tenderness/nodules; no carotid   bruit or JVD  Back:     Symmetric, no curvature, ROM normal, no CVA tenderness  Lungs:     Clear to auscultation bilaterally, respirations unlabored  Chest wall:    No tenderness or deformity, MediPort in place  Heart:    Regular rate and rhythm, S1 and S2 normal, no murmur, rub   or gallop  Abdomen:     Soft, non-tender, bowel sounds active all four quadrants,    no masses, no organomegaly        Extremities:   Extremities normal, atraumatic, no cyanosis or edema  Pulses:   2+ and symmetric all extremities  Skin:   Skin color, texture, turgor normal, no rashes or lesions  Lymph nodes:   Cervical, supraclavicular, and axillary nodes normal  Neurologic:   CNII-XII intact. Normal strength, sensation and reflexes      throughout    Lab Results:  Recent Labs  12/22/12 0915  NA 134*  K 3.7  CL 101  CO2 23  GLUCOSE 104*  BUN 9  CREATININE 0.51  CALCIUM 9.3   No results found for this basename: AST, ALT,  ALKPHOS, BILITOT, PROT, ALBUMIN,  in the last 72 hours No results found for this basename: LIPASE, AMYLASE,  in the last 72 hours  Recent Labs  12/21/12 0525 12/22/12 0915  WBC 13.3* 13.9*  NEUTROABS 7.4  --   HGB 7.5* 8.5*  HCT 21.8* 24.7*  MCV 87.2 87.0  PLT 353 401*   No results found for this basename: CKTOTAL, CKMB, CKMBINDEX, TROPONINI,  in the last 72 hours No components found with this basename: POCBNP,  No results found for this basename: DDIMER,  in the last 72 hours No results found for this basename: HGBA1C,  in the last 72 hours No results found for this basename: CHOL, HDL, LDLCALC, TRIG, CHOLHDL, LDLDIRECT,  in the last 72 hours No results found for this basename: TSH, T4TOTAL, FREET3, T3FREE, THYROIDAB,  in the last 72 hours  Recent Labs  12/22/12 0915  RETICCTPCT 5.3*    Micro Results: No results found for this or any previous visit (from the past 240 hour(s)).  Studies/Results: No results found. Medications: Scheduled Meds: . folic acid  1 mg Oral q morning - 10a  . HYDROmorphone PCA 0.3 mg/mL   Intravenous Q4H  . hydroxyurea  1,000 mg Oral Daily  . morphine  15 mg Oral BID  . potassium chloride SA  20 mEq Oral q morning - 10a  . Rivaroxaban  20 mg Oral q morning - 10a  . senna-docusate  1 tablet Oral BID  . sodium chloride  3 mL Intravenous Q12H   Continuous Infusions:  PRN Meds:.diphenhydrAMINE, HYDROmorphone (DILAUDID) injection, naloxone, ondansetron (ZOFRAN) IV, polyethylene glycol, sodium chloride, sodium chloride, zolpidem  Assessment/Plan: @PROBHOSP @  LOS: 8 days   Hb SS with crisis: Pain improving. Restart PRN dilaudid 1mg  q3hr , I expect patient should be ready for discharge tomorrow.  Anemia: Pt had evidence of acute hemolysis, transfused 1 unit of PRBC, H/H stable.   Leukocytosis: No signs of infection, elevated WBC likely due to vaso-occlusive crisis.  Epistaxis: Resolved. Pulmonary Embolism: continue Xarelto  Mood  disorder/Anxiety: discussion and encouragement underway. Patient remains resistant to seeking outpatient therapy  Opoid dependence: continue home medications    Code Status: full Code  Disposition Plan: hopefully next 24 hours   Shahira Fiske 12/23/2012, 1:43 PM

## 2012-12-24 ENCOUNTER — Encounter: Payer: Self-pay | Admitting: Hematology

## 2012-12-24 DIAGNOSIS — R040 Epistaxis: Secondary | ICD-10-CM | POA: Diagnosis not present

## 2012-12-24 DIAGNOSIS — D5700 Hb-SS disease with crisis, unspecified: Secondary | ICD-10-CM | POA: Diagnosis not present

## 2012-12-24 DIAGNOSIS — F411 Generalized anxiety disorder: Secondary | ICD-10-CM | POA: Diagnosis not present

## 2012-12-24 DIAGNOSIS — D72829 Elevated white blood cell count, unspecified: Secondary | ICD-10-CM | POA: Diagnosis not present

## 2012-12-24 DIAGNOSIS — Z7901 Long term (current) use of anticoagulants: Secondary | ICD-10-CM | POA: Diagnosis not present

## 2012-12-24 DIAGNOSIS — I2699 Other pulmonary embolism without acute cor pulmonale: Secondary | ICD-10-CM | POA: Diagnosis not present

## 2012-12-24 LAB — CBC
MCHC: 33.5 g/dL (ref 30.0–36.0)
Platelets: 392 10*3/uL (ref 150–400)
RDW: 17.9 % — ABNORMAL HIGH (ref 11.5–15.5)
WBC: 15.5 10*3/uL — ABNORMAL HIGH (ref 4.0–10.5)

## 2012-12-24 LAB — BASIC METABOLIC PANEL
Calcium: 9.2 mg/dL (ref 8.4–10.5)
Chloride: 103 mEq/L (ref 96–112)
Creatinine, Ser: 0.51 mg/dL (ref 0.50–1.35)
GFR calc Af Amer: >90 mL/min (ref >90)
GFR calc non Af Amer: >90 mL/min (ref >90)
Glucose, Bld: 108 mg/dL — ABNORMAL HIGH (ref 70–99)
Potassium: 3.8 mEq/L (ref 3.5–5.1)

## 2012-12-24 IMAGING — CR DG CHEST 2V
2 series · 2 of 2 positions shown · non-contrast
Comparison: 01/18/2010

CLINICAL DATA: Sickle cell crisis with pain.

CHEST - 2 VIEW

[w chest lat]
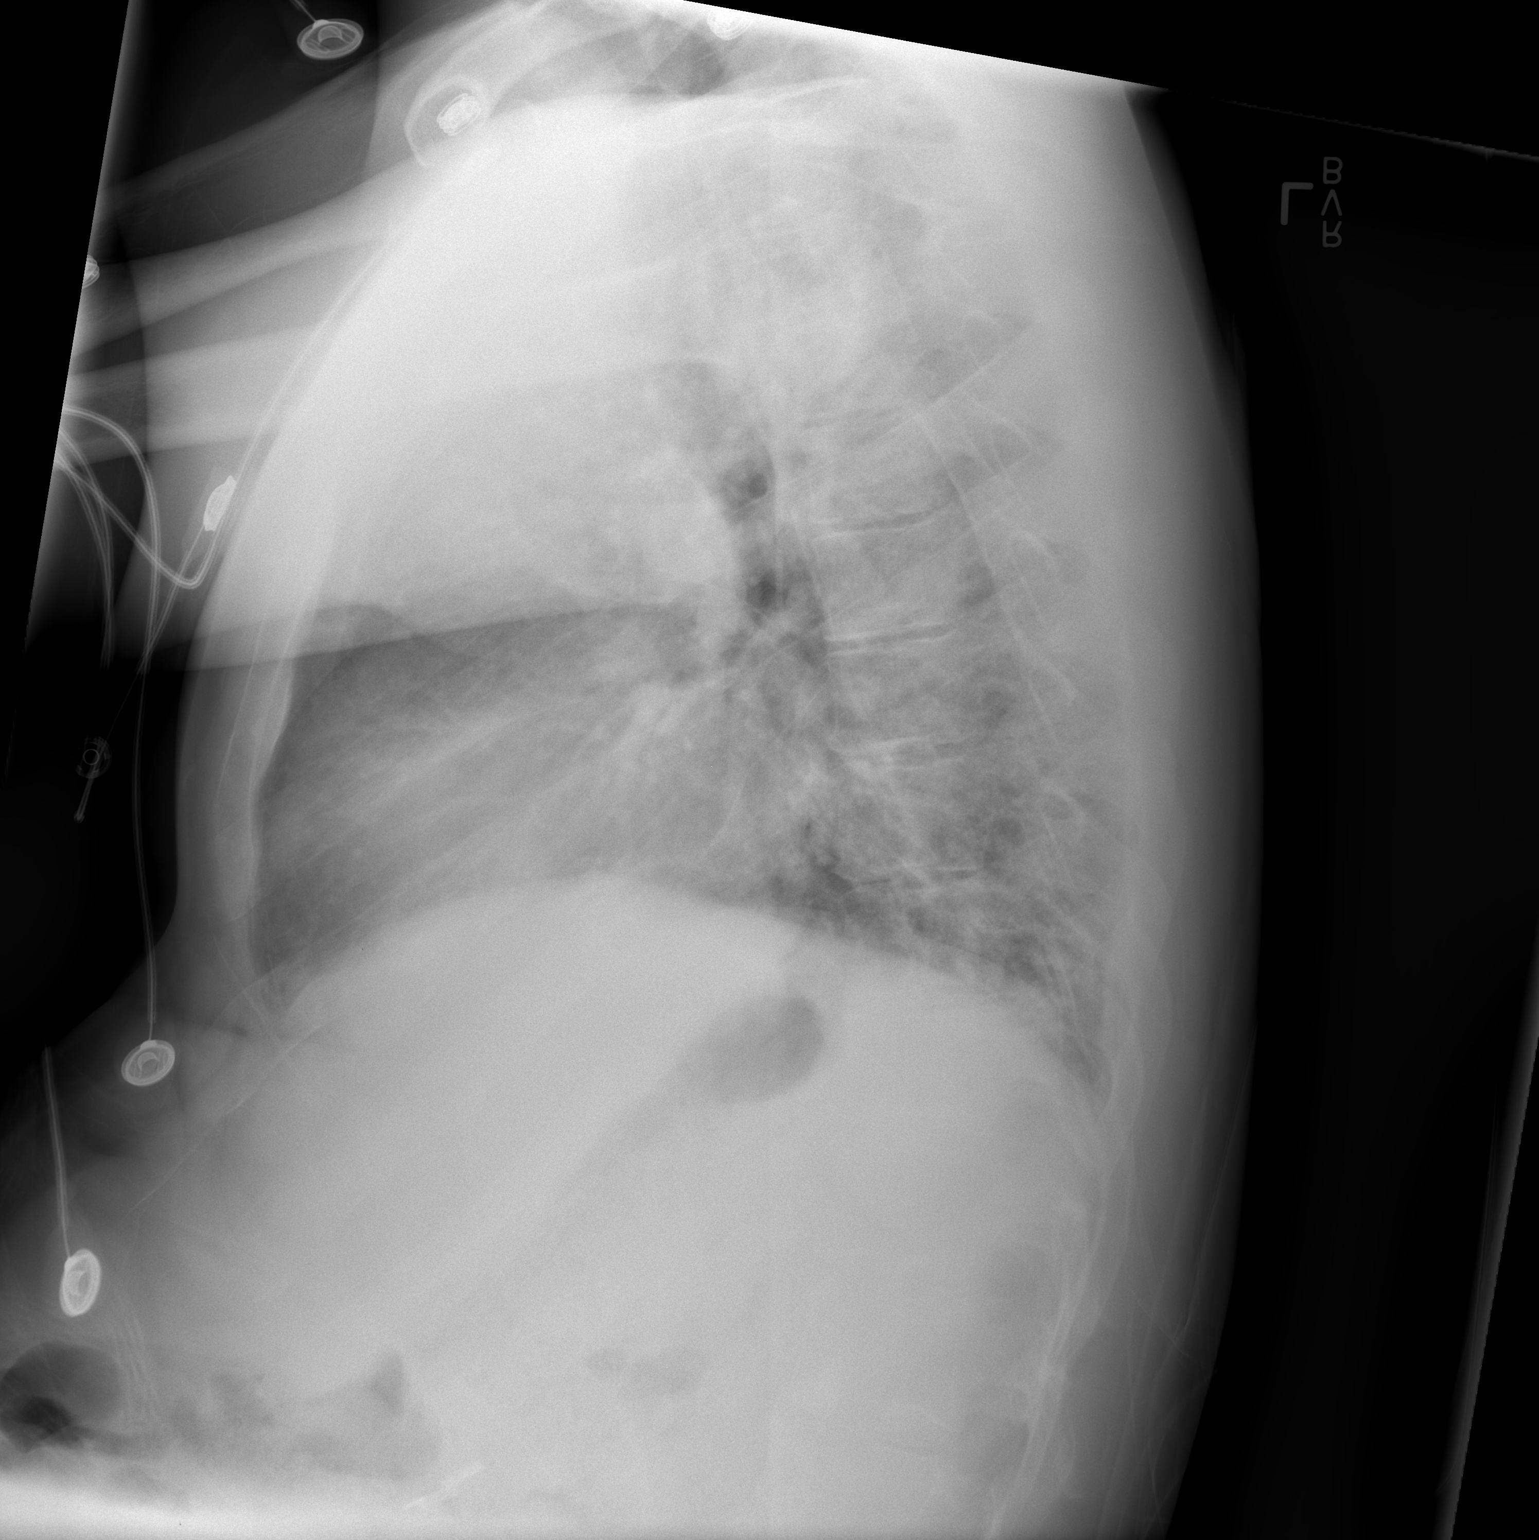

[view not recorded]
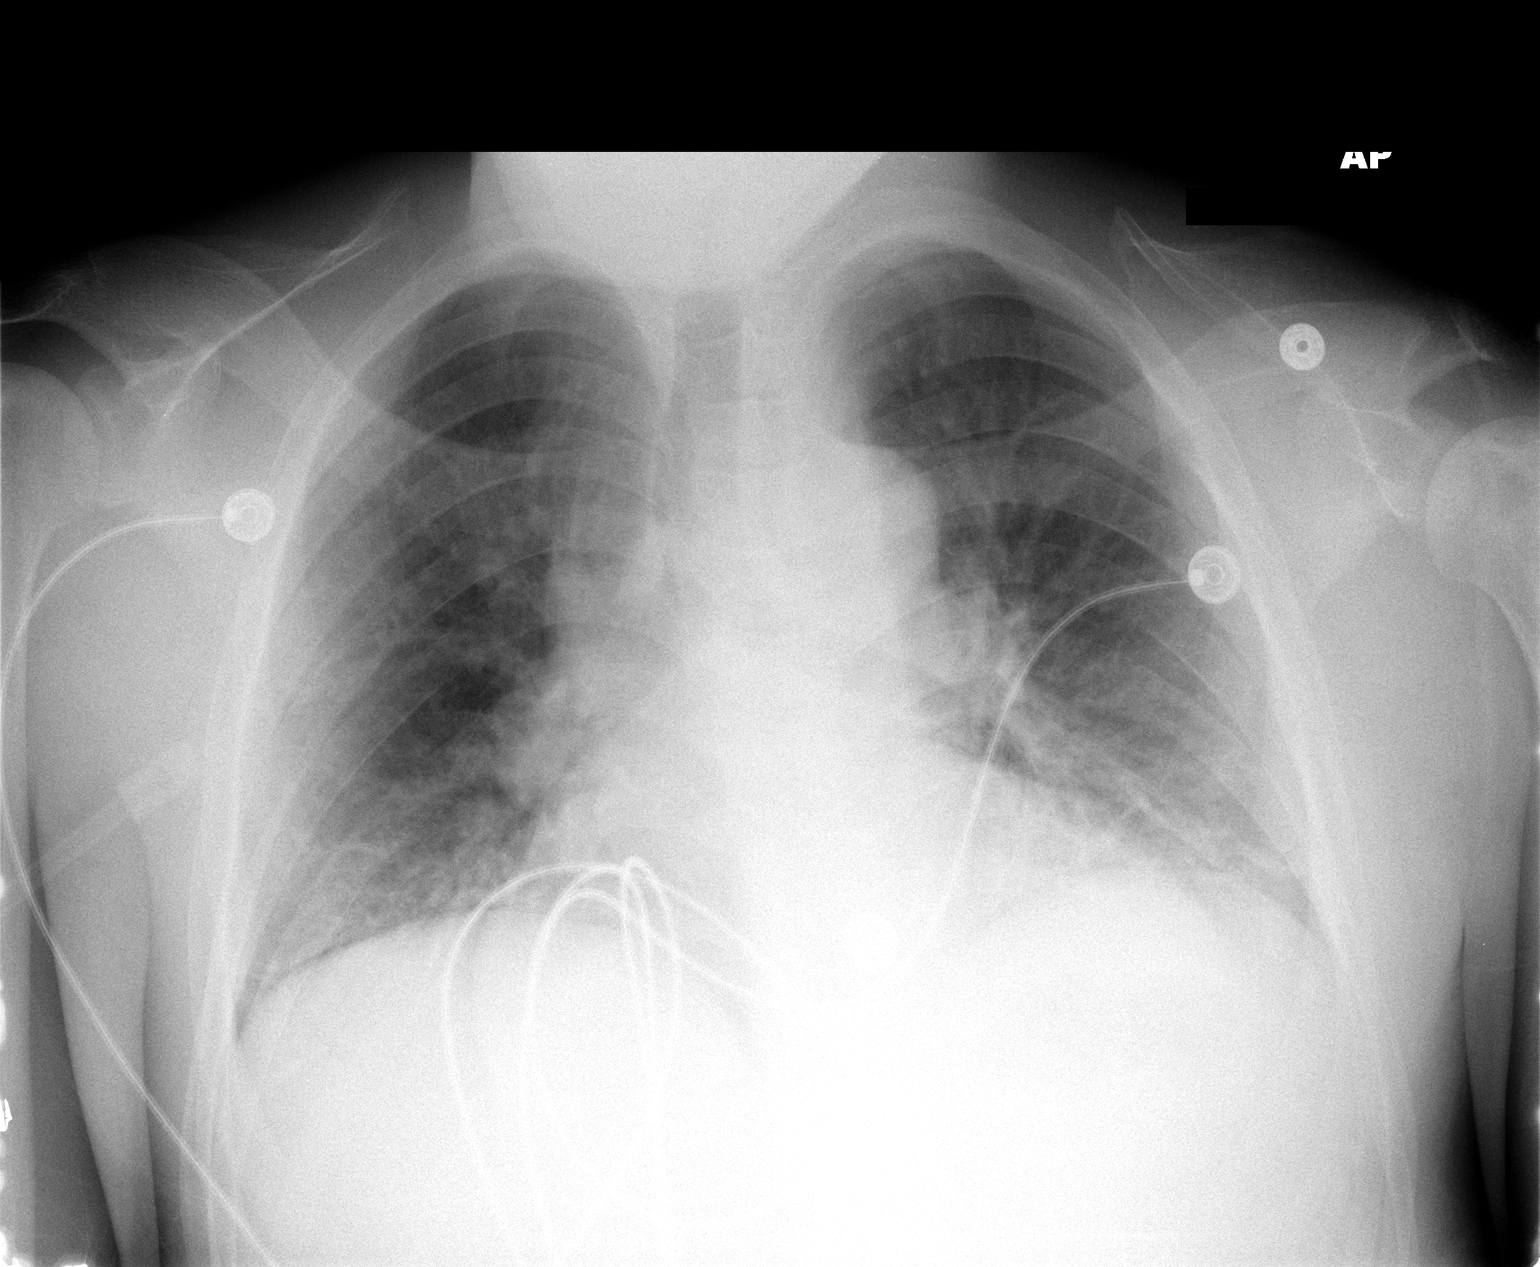

[2 of 2 positions shown; findings below may reference images not displayed]

FINDINGS: Two views of the chest demonstrate low lung volumes.
There is evidence for bibasilar atelectasis.  No evidence for
pulmonary edema.  Prominent mediastinal structures are probably
related to the low lung volumes.  No evidence for pleural
effusions.
IMPRESSION: Low lung volumes with bibasilar atelectasis.

## 2012-12-24 MED ORDER — MORPHINE SULFATE ER 15 MG PO TBCR: 15.0000 mg | EXTENDED_RELEASE_TABLET | Freq: Two times a day (BID) | ORAL | Status: DC

## 2012-12-24 MED ORDER — HYDROMORPHONE HCL 4 MG PO TABS: ORAL_TABLET | ORAL | Status: DC

## 2012-12-24 MED ORDER — HEPARIN SOD (PORK) LOCK FLUSH 100 UNIT/ML IV SOLN
500.0000 [IU] | INTRAVENOUS | Status: AC | PRN
  Administered 2012-12-24: 500 [IU]

## 2012-12-24 NOTE — Progress Notes (Signed)
Patient received discharge instructions using teach back method and he demonstrated understanding.  Patient stable for discharge home.  Patient understands follow up care and pain management plan.  [name] [facility]  12/24/2012  4:16 PM

## 2012-12-24 NOTE — Discharge Summary (Signed)
Physician Discharge Summary  Patient ID: [name] MRN: [id number] DOB/AGE: 1980/03/17 32 y.o.  Admit date: 12/15/2012 Discharge date: 12/24/2012  Admission Diagnoses:  Discharge Diagnoses:  Active Problems:   Sickle cell anemia with crisis   Discharged Condition: good  Hospital Course: Patient admitted with sickle cell painful crisis. He has some hemolytic crisis as well. He was transfused one unit of packed red blood cell, also IV Dilaudid PCA. He had a relatively prolonged course of treatment but was feeling better at the time of discharge. He was discharged home on his home medications to follow up with Dr. Ashley [facility]ty [facility].  Consults: None  Significant Diagnostic Studies: labs: CBCs and CMPs. His hemoglobin was less than 7 at some point and he received blood transfusion.  Treatments: IV hydration, analgesia: acetaminophen and Dilaudid and blood transfusion.  Discharge Exam: Blood pressure 132/80, pulse 86, temperature 98.6 F (37 C), temperature source Oral, resp. rate 16, height 6' (1.829 m), weight 77.293 kg (170 lb 6.4 oz), SpO2 100.00%. General appearance: alert, cooperative and no distress Eyes: conjunctivae/corneas clear. PERRL, EOM's intact. Fundi benign. Neck: no adenopathy, no carotid bruit, no JVD, supple, symmetrical, trachea midline and thyroid not enlarged, symmetric, no tenderness/mass/nodules Back: symmetric, no curvature. ROM normal. No CVA tenderness. Resp: clear to auscultation bilaterally Chest wall: no tenderness Cardio: regular rate and rhythm, S1, S2 normal, no murmur, click, rub or gallop GI: soft, non-tender; bowel sounds normal; no masses,  no organomegaly Extremities: extremities normal, atraumatic, no cyanosis or edema Pulses: 2+ and symmetric Skin: Skin color, texture, turgor normal. No rashes or lesions Neurologic: Grossly normal  Disposition: 01-Home or Self Care     Medication List         celecoxib  200 MG capsule  Commonly known as:  CELEBREX  Take 1 capsule (200 mg total) by mouth 2 (two) times daily.     folic acid 1 MG tablet  Commonly known as:  FOLVITE  Take 1 tablet (1 mg total) by mouth every morning.     HYDROmorphone 4 MG tablet  Commonly known as:  DILAUDID  Take 8 mg every 4 hours for next 24 hours then resume 4 mg every 4 hours as needed for pain.     hydroxyurea 500 MG capsule  Commonly known as:  HYDREA  Take 2 capsules (1,000 mg total) by mouth daily. May take with food to minimize GI side effects.     morphine 15 MG 12 hr tablet  Commonly known as:  MS CONTIN  Take 1 tablet (15 mg total) by mouth 2 (two) times daily.     potassium chloride SA 20 MEQ tablet  Commonly known as:  K-DUR,KLOR-CON  Take 1 tablet (20 mEq total) by mouth every morning.     Rivaroxaban 20 MG Tabs tablet  Commonly known as:  XARELTO  Take 1 tablet (20 mg total) by mouth every morning.     zolpidem 10 MG tablet  Commonly known as:  AMBIEN  Take 1 tablet (10 mg total) by mouth at bedtime as needed for sleep.         SignedLonia Blood 12/24/2012, 3:32 PM

## 2012-12-24 NOTE — Progress Notes (Signed)
Patient ID: [name], male   DOB: [birth year], 32 y.o.   MRN: [id number] Patient being discharged from hospital on today.  Spoke with Dr. Mikeal Hawthorne via telephone, and he expressed that he would write for patients dilaudid and MS contin.  I made this note, and placed a note in the prescription book.

## 2012-12-30 IMAGING — CR DG CHEST 2V
2 series · 2 of 2 positions shown · non-contrast
Comparison: 01/31/2010

CLINICAL DATA: Chest pain

CHEST - 2 VIEW

[w chest pa]
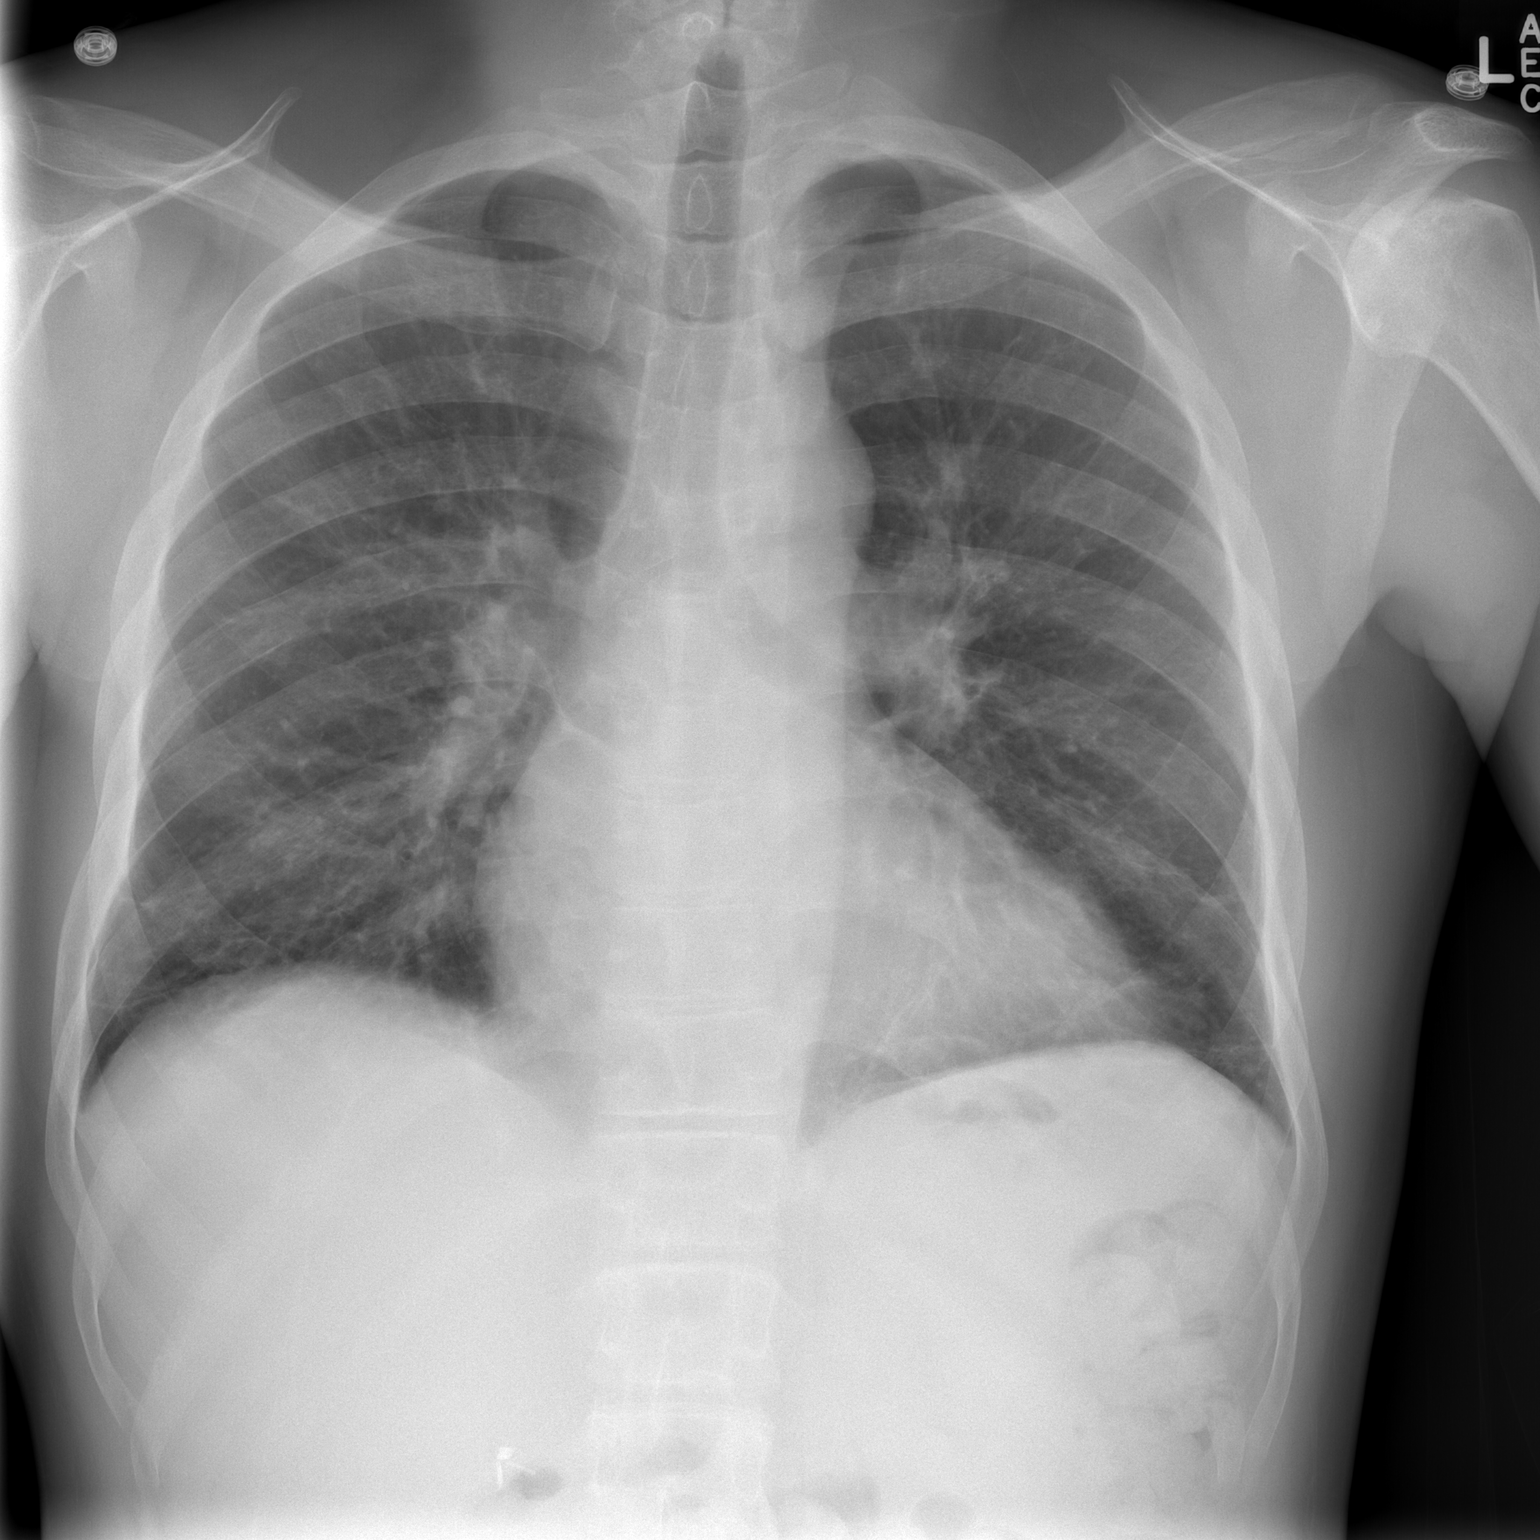

[w chest lat]
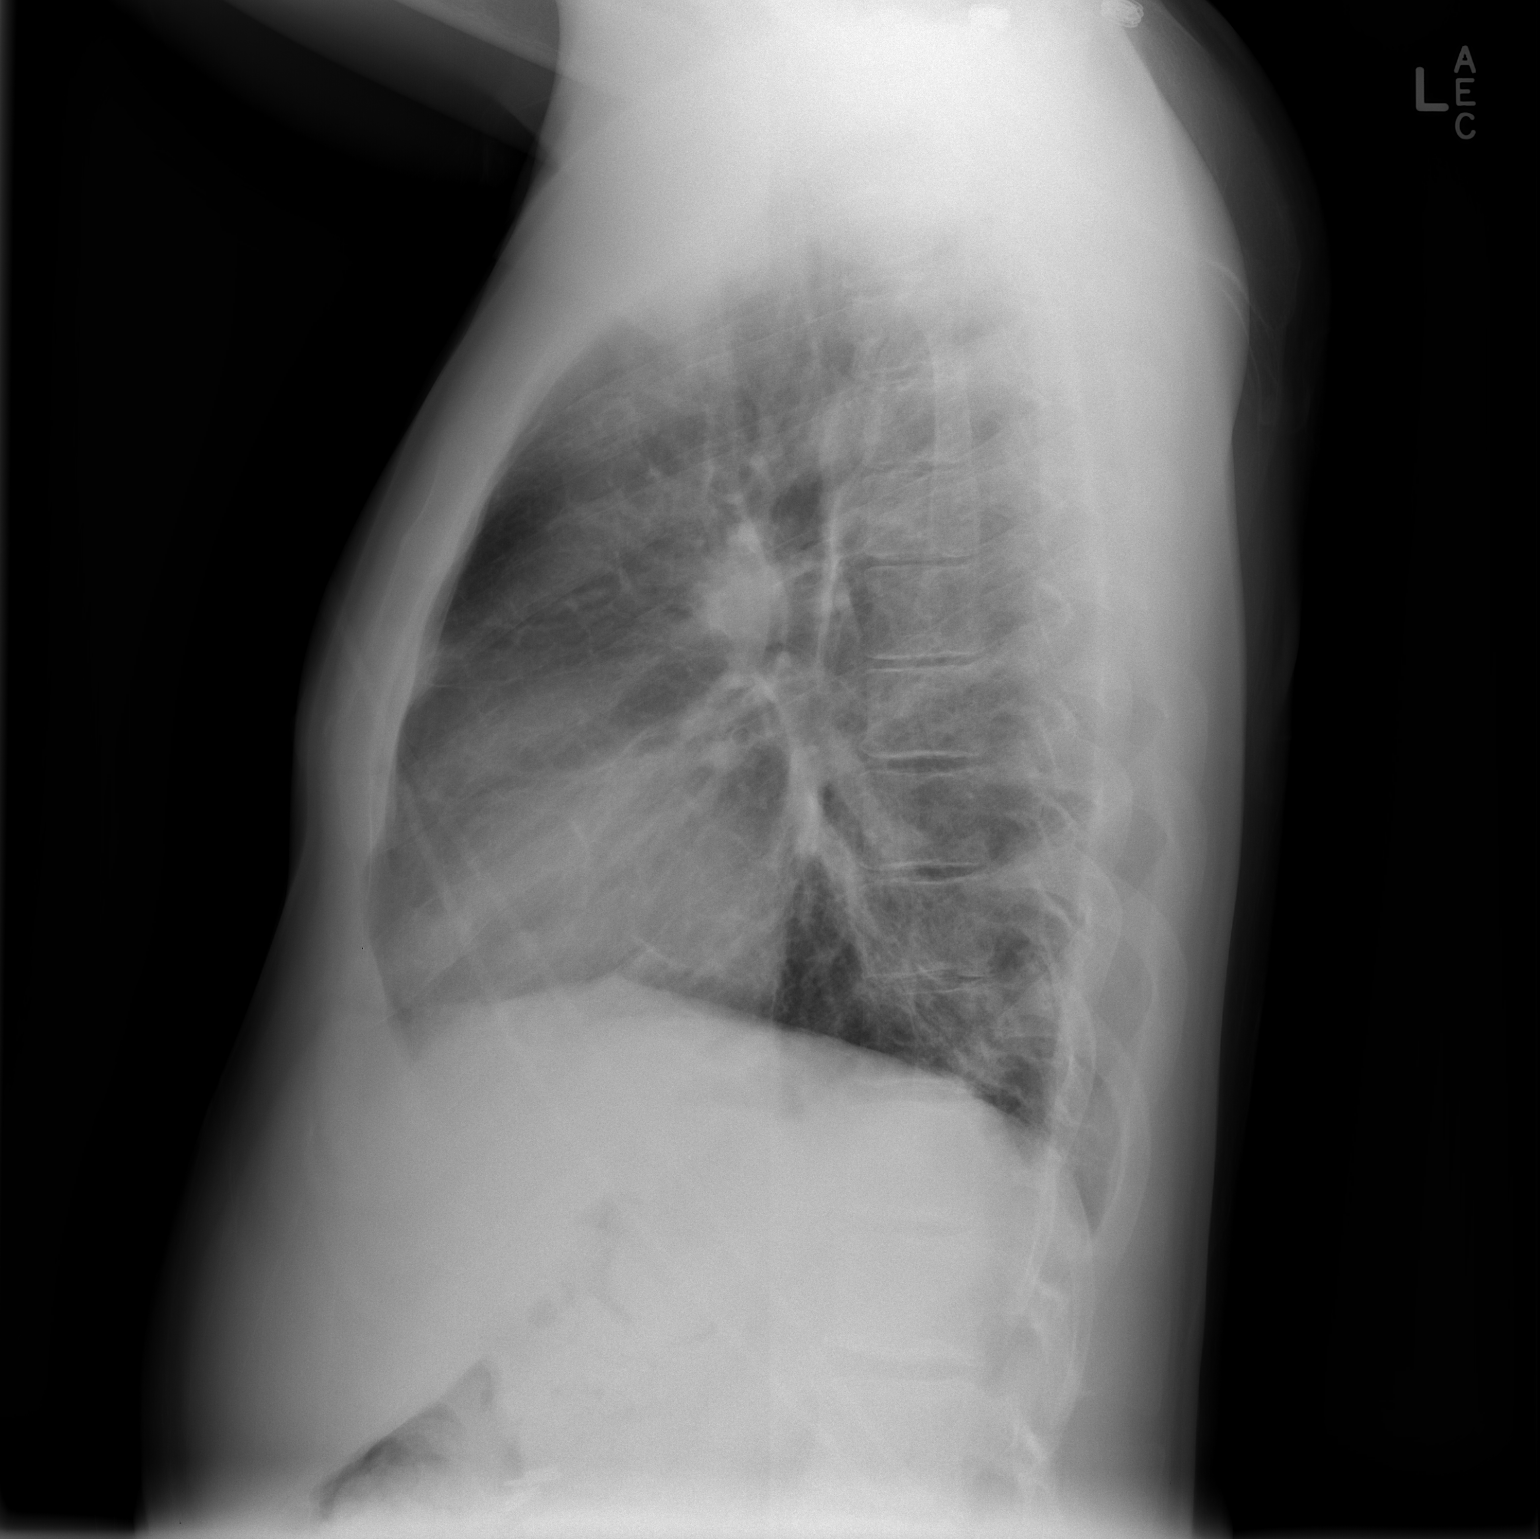

[2 of 2 positions shown; findings below may reference images not displayed]

FINDINGS: Improved aeration with minimal residual subsegmental
atelectasis or scarring at the lung bases.  No confluent airspace
infiltrate.  No overt edema.  No effusion.  Heart size upper limits
normal.  Sclerotic changes in the left humeral head  incidentally
noted.
IMPRESSION: 1.  Improved aeration with minimal bibasilar linear scarring or
subsegmental atelectasis.

## 2013-01-03 ENCOUNTER — Emergency Department (HOSPITAL_COMMUNITY)
Admission: EM | Admit: 2013-01-03 | Discharge: 2013-01-03 | Disposition: A | Discharge status: Home / Self Care | Payer: Medicare Other | Attending: Emergency Medicine | Admitting: Emergency Medicine

## 2013-01-03 ENCOUNTER — Encounter (HOSPITAL_COMMUNITY): Payer: Self-pay

## 2013-01-03 ENCOUNTER — Emergency Department (HOSPITAL_COMMUNITY): Payer: Medicare Other

## 2013-01-03 ENCOUNTER — Encounter: Payer: Self-pay | Admitting: Internal Medicine

## 2013-01-03 ENCOUNTER — Ambulatory Visit (INDEPENDENT_AMBULATORY_CARE_PROVIDER_SITE_OTHER): Payer: Medicare Other | Admitting: Internal Medicine

## 2013-01-03 ENCOUNTER — Encounter (HOSPITAL_COMMUNITY): Payer: Self-pay | Admitting: Emergency Medicine

## 2013-01-03 ENCOUNTER — Non-Acute Institutional Stay (HOSPITAL_COMMUNITY)
Admission: AD | Admit: 2013-01-03 | Discharge: 2013-01-03 | Disposition: A | Discharge status: Home / Self Care | Payer: Medicare Other | Source: Ambulatory Visit | Attending: Internal Medicine | Admitting: Internal Medicine

## 2013-01-03 VITALS — BP 128/78 | HR 95 | Temp 98.2°F | Resp 16 | Ht 72.0 in | Wt 187.0 lb

## 2013-01-03 DIAGNOSIS — Z8619 Personal history of other infectious and parasitic diseases: Secondary | ICD-10-CM | POA: Insufficient documentation

## 2013-01-03 DIAGNOSIS — E876 Hypokalemia: Secondary | ICD-10-CM | POA: Diagnosis not present

## 2013-01-03 DIAGNOSIS — Z87891 Personal history of nicotine dependence: Secondary | ICD-10-CM | POA: Insufficient documentation

## 2013-01-03 DIAGNOSIS — Z79899 Other long term (current) drug therapy: Secondary | ICD-10-CM | POA: Insufficient documentation

## 2013-01-03 DIAGNOSIS — I10 Essential (primary) hypertension: Secondary | ICD-10-CM | POA: Insufficient documentation

## 2013-01-03 DIAGNOSIS — R918 Other nonspecific abnormal finding of lung field: Secondary | ICD-10-CM | POA: Diagnosis not present

## 2013-01-03 DIAGNOSIS — Z8739 Personal history of other diseases of the musculoskeletal system and connective tissue: Secondary | ICD-10-CM | POA: Diagnosis not present

## 2013-01-03 DIAGNOSIS — D5700 Hb-SS disease with crisis, unspecified: Secondary | ICD-10-CM

## 2013-01-03 DIAGNOSIS — Z86711 Personal history of pulmonary embolism: Secondary | ICD-10-CM | POA: Insufficient documentation

## 2013-01-03 DIAGNOSIS — Z79891 Long term (current) use of opiate analgesic: Secondary | ICD-10-CM

## 2013-01-03 DIAGNOSIS — Z0289 Encounter for other administrative examinations: Secondary | ICD-10-CM

## 2013-01-03 DIAGNOSIS — Z8659 Personal history of other mental and behavioral disorders: Secondary | ICD-10-CM | POA: Insufficient documentation

## 2013-01-03 LAB — CBC WITH DIFFERENTIAL/PLATELET
Basophils Absolute: 0.1 10*3/uL (ref 0.0–0.1)
Basophils Absolute: 0.2 10*3/uL — ABNORMAL HIGH (ref 0.0–0.1)
Basophils Relative: 1 % (ref 0–1)
Basophils Relative: 1 % (ref 0–1)
Eosinophils Relative: 2 % (ref 0–5)
Eosinophils Relative: 2 % (ref 0–5)
HCT: 22.5 % — ABNORMAL LOW (ref 39.0–52.0)
HCT: 20.2 % — ABNORMAL LOW (ref 39.0–52.0)
Lymphocytes Relative: 21 % (ref 12–46)
MCH: 31.3 pg (ref 26.0–34.0)
MCHC: 33.7 g/dL (ref 30.0–36.0)
MCV: 93.4 fL (ref 78.0–100.0)
MCV: 93.1 fL (ref 78.0–100.0)
Monocytes Absolute: 2 10*3/uL — ABNORMAL HIGH (ref 0.1–1.0)
Monocytes Absolute: 2.5 10*3/uL — ABNORMAL HIGH (ref 0.1–1.0)
Neutro Abs: 12.9 10*3/uL — ABNORMAL HIGH (ref 1.7–7.7)
Neutrophils Relative %: 66 % (ref 43–77)
Platelets: 572 10*3/uL — ABNORMAL HIGH (ref 150–400)
RBC: 2.17 MIL/uL — ABNORMAL LOW (ref 4.22–5.81)
RDW: 20.3 % — ABNORMAL HIGH (ref 11.5–15.5)
RDW: 19.8 % — ABNORMAL HIGH (ref 11.5–15.5)
WBC: 19.6 10*3/uL — ABNORMAL HIGH (ref 4.0–10.5)

## 2013-01-03 LAB — COMPREHENSIVE METABOLIC PANEL
ALT: 17 U/L (ref 0–53)
AST: 36 U/L (ref 0–37)
Albumin: 3.7 g/dL (ref 3.5–5.2)
Alkaline Phosphatase: 100 U/L (ref 39–117)
CO2: 22 mEq/L (ref 19–32)
Chloride: 104 mEq/L (ref 96–112)
Creatinine, Ser: 0.76 mg/dL (ref 0.50–1.35)
GFR calc non Af Amer: >90 mL/min (ref >90)
Potassium: 3.5 mEq/L (ref 3.5–5.1)
Sodium: 136 mEq/L (ref 135–145)
Total Bilirubin: 4 mg/dL — ABNORMAL HIGH (ref 0.3–1.2)

## 2013-01-03 LAB — RETICULOCYTES: RBC.: 2.41 MIL/uL — ABNORMAL LOW (ref 4.22–5.81)

## 2013-01-03 LAB — PREPARE RBC (CROSSMATCH)

## 2013-01-03 MED ORDER — SODIUM CHLORIDE 0.9 % IJ SOLN
INTRAMUSCULAR | Status: AC
  Administered 2013-01-03: 10 mL
  Filled 2013-01-03: qty 10

## 2013-01-03 MED ORDER — KETOROLAC TROMETHAMINE 30 MG/ML IJ SOLN
30.0000 mg | Freq: Once | INTRAMUSCULAR | Status: AC
  Administered 2013-01-03: 30 mg via INTRAVENOUS
  Filled 2013-01-03: qty 1

## 2013-01-03 MED ORDER — HYDROMORPHONE HCL PF 2 MG/ML IJ SOLN
2.0000 mg | Freq: Once | INTRAMUSCULAR | Status: AC
  Administered 2013-01-03: 2 mg via INTRAVENOUS
  Filled 2013-01-03: qty 1

## 2013-01-03 MED ORDER — HYDROMORPHONE HCL PF 2 MG/ML IJ SOLN
INTRAMUSCULAR | Status: AC
  Filled 2013-01-03: qty 2

## 2013-01-03 MED ORDER — HYDROMORPHONE HCL PF 2 MG/ML IJ SOLN
INTRAMUSCULAR | Status: AC
  Administered 2013-01-03: 2.5 mg via INTRAVENOUS
  Filled 2013-01-03: qty 2

## 2013-01-03 MED ORDER — ONDANSETRON HCL 4 MG/2ML IJ SOLN
4.0000 mg | Freq: Once | INTRAMUSCULAR | Status: AC
  Administered 2013-01-03: 4 mg via INTRAVENOUS
  Filled 2013-01-03: qty 2

## 2013-01-03 MED ORDER — SODIUM CHLORIDE 0.9 % IJ SOLN: 9.0000 mL | INTRAMUSCULAR | Status: DC | PRN

## 2013-01-03 MED ORDER — SODIUM CHLORIDE 0.9 % IV SOLN
Freq: Once | INTRAVENOUS | Status: AC
  Administered 2013-01-03: 04:00:00 via INTRAVENOUS

## 2013-01-03 MED ORDER — DIPHENHYDRAMINE HCL 50 MG/ML IJ SOLN
12.5000 mg | Freq: Once | INTRAMUSCULAR | Status: AC
  Administered 2013-01-03: 12.5 mg via INTRAVENOUS
  Filled 2013-01-03: qty 1

## 2013-01-03 MED ORDER — HYDROMORPHONE HCL 4 MG PO TABS: ORAL_TABLET | ORAL | Status: DC

## 2013-01-03 MED ORDER — HYDROMORPHONE HCL 4 MG PO TABS
4.0000 mg | ORAL_TABLET | ORAL | Status: DC
  Administered 2013-01-03: 4 mg via ORAL
  Filled 2013-01-03: qty 1

## 2013-01-03 MED ORDER — HEPARIN SOD (PORK) LOCK FLUSH 100 UNIT/ML IV SOLN
500.0000 [IU] | INTRAVENOUS | Status: DC | PRN
  Filled 2013-01-03: qty 5

## 2013-01-03 MED ORDER — HEPARIN SOD (PORK) LOCK FLUSH 100 UNIT/ML IV SOLN
INTRAVENOUS | Status: AC
  Filled 2013-01-03: qty 5

## 2013-01-03 MED ORDER — FOLIC ACID 1 MG PO TABS
1.0000 mg | ORAL_TABLET | Freq: Every day | ORAL | Status: DC
  Filled 2013-01-03: qty 1

## 2013-01-03 MED ORDER — NALOXONE HCL 0.4 MG/ML IJ SOLN: 0.4000 mg | INTRAMUSCULAR | Status: DC | PRN

## 2013-01-03 MED ORDER — ONDANSETRON HCL 4 MG/2ML IJ SOLN: 4.0000 mg | Freq: Four times a day (QID) | INTRAMUSCULAR | Status: DC | PRN

## 2013-01-03 MED ORDER — SODIUM CHLORIDE 0.9 % IJ SOLN: 10.0000 mL | INTRAMUSCULAR | Status: DC | PRN

## 2013-01-03 MED ORDER — HYDROMORPHONE 0.3 MG/ML IV SOLN
INTRAVENOUS | Status: DC
  Administered 2013-01-03: 6.39 mg via INTRAVENOUS
  Administered 2013-01-03: 17:00:00 via INTRAVENOUS
  Filled 2013-01-03: qty 25

## 2013-01-03 MED ORDER — DIPHENHYDRAMINE HCL 25 MG PO CAPS
25.0000 mg | ORAL_CAPSULE | Freq: Four times a day (QID) | ORAL | Status: DC | PRN
  Administered 2013-01-03: 50 mg via ORAL
  Filled 2013-01-03: qty 2

## 2013-01-03 MED ORDER — HYDROMORPHONE HCL PF 2 MG/ML IJ SOLN
3.0000 mg | Freq: Once | INTRAMUSCULAR | Status: AC
  Administered 2013-01-03: 3 mg via INTRAVENOUS

## 2013-01-03 MED ORDER — HYDROMORPHONE HCL PF 2 MG/ML IJ SOLN
2.5000 mg | Freq: Once | INTRAMUSCULAR | Status: AC
  Administered 2013-01-03: 2.5 mg via INTRAVENOUS

## 2013-01-03 MED ORDER — DEXTROSE-NACL 5-0.45 % IV SOLN
INTRAVENOUS | Status: DC
  Administered 2013-01-03: 16:00:00 via INTRAVENOUS

## 2013-01-03 NOTE — H&P (Signed)
[facility] History and Physical  [name]:[id number] DOB: 1980-12-16 DOA: 01/03/2013   PCP: Amiylah Anastos A., MD   Chief Complaint: Pian in back, legs and ribs.  HPI:Pt was seen in the Ed last night for c/o pain in back, legs and ribs. He was treated in the ED and left as he did not want to be admitted and still does not want to be admitted for pain. He states that his pain is presently localized to the said areas and at an intensity of 7/10. He describes pain as throbbing and sharp in nature. He denies any associated symptoms. In particular he denies and SOB, DOE, Pleuritic pain, F/C, N/V/D.  He is unable to identify any palliative or provocative features but thinks that the weather may be a trigger. He has taken his MS Contin and 1 dose of Dilaudid today.    Review of Systems:  Constitutional: No weight loss, night sweats, Fevers, chills, fatigue.  HEENT: No headaches, dizziness, seizures, vision changes, difficulty swallowing,Tooth/dental problems,Sore throat, No sneezing, itching, ear ache, nasal congestion, post nasal drip,  Cardio-vascular: No chest pain, Orthopnea, PND, swelling in lower extremities, anasarca, dizziness, palpitations  GI: No heartburn, indigestion, abdominal pain, nausea, vomiting, diarrhea, change in bowel habits, loss of appetite  Resp: No shortness of breath with exertion or at rest. No excess mucus, no productive cough, No non-productive cough, No coughing up of blood.No change in color of mucus.No wheezing.No chest wall deformity  Skin: no rash or lesions.  GU: no dysuria, change in color of urine, no urgency or frequency. No flank pain.  Psych: No change in mood or affect. No depression or anxiety. No memory loss.    Past Medical History  Diagnosis Date  . Sickle cell anemia   . Blood transfusion   . 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 Hypokalemia   . Mood disorder   . Pulmonary embolism   .  Avascular necrosis   . Leukocytosis     Chronic  . Thrombocytosis     Chronic  . Hypertension   . C. difficile colitis    Past Surgical History  Procedure Laterality Date  . Right hip replacement      08/2006  . Cholecystectomy      01/2008  . Porta cath placement    . Porta cath removal    . Umbilical hernia repair      01/2008  . Excision of left periauricular cyst      10/2009  . Excision of right ear lobe cyst with primary closur      11/2007  . Portacath placement  01/05/2012    Procedure: INSERTION PORT-A-CATH;  Surgeon: Adolph Pollack, MD;  Location: [facility] OR;  Service: General;  Laterality: N/A;  ultrasound guiced port a cath insertion with fluoroscopy   Social History:  reports that he quit smoking about 2 years ago. His smoking use included Cigarettes. He smoked 0.00 packs per day for 13 years. He has never used smokeless tobacco. He reports that he does not drink alcohol or use illicit drugs.  Allergies  Allergen Reactions  . Morphine And Related Hives and Rash    Pt states he can take the pill form, but not IV and he is able to tolerate Dilaudid with no reactions.    Family History  Problem Relation Age of Onset  . Sickle cell anemia Mother   . Sickle cell anemia Father   . Sickle  cell trait Brother     Prior to Admission medications   Medication Sig Start Date End Date Taking? Authorizing Provider  celecoxib (CELEBREX) 200 MG capsule Take 1 capsule (200 mg total) by mouth 2 (two) times daily. 11/19/12   Altha Harm, MD  folic acid (FOLVITE) 1 MG tablet Take 1 tablet (1 mg total) by mouth every morning. 06/05/12   Altha Harm, MD  HYDROmorphone (DILAUDID) 4 MG tablet Take 8 mg by mouth every 4 (four) hours as needed for severe pain.    Historical Provider, MD  hydroxyurea (HYDREA) 500 MG capsule Take 2 capsules (1,000 mg total) by mouth daily. May take with food to minimize GI side effects. 11/19/12   Altha Harm, MD  morphine (MS CONTIN)  15 MG 12 hr tablet Take 1 tablet (15 mg total) by mouth 2 (two) times daily. 12/24/12   Rometta Emery, MD  potassium chloride SA (K-DUR,KLOR-CON) 20 MEQ tablet Take 1 tablet (20 mEq total) by mouth every morning. 07/31/12   Altha Harm, MD  Rivaroxaban (XARELTO) 20 MG TABS tablet Take 1 tablet (20 mg total) by mouth every morning. 11/21/12   Altha Harm, MD  zolpidem (AMBIEN) 10 MG tablet Take 1 tablet (10 mg total) by mouth at bedtime as needed for sleep. 11/28/12   Grayce Sessions, NP   Physical Exam: Filed Vitals:   01/03/13 1530  BP: 112/74  Pulse: 78  Temp: 98.3 F (36.8 C)  TempSrc: Oral  Resp: 16  SpO2: 97%   BP 112/74  Pulse 78  Temp(Src) 98.3 F (36.8 C) (Oral)  Resp 16  SpO2 97%  General Appearance:    Alert, cooperative, no distress, appears stated age  Head:    Normocephalic, without obvious abnormality, atraumatic  Eyes:    PERRL, conjunctiva/corneas clear, EOM's intact, fundi    benign, both eyes anicteric.  Throat:   Lips, mucosa, and tongue normal; teeth and gums normal. Pt has a geographic tongue.  Neck:   Supple, symmetrical, trachea midline, no adenopathy;       thyroid:  No enlargement/tenderness/nodules; no carotid   bruit or JVD  Back:     Symmetric, no curvature, ROM normal, no CVA tenderness  Lungs:     Clear to auscultation bilaterally, respirations unlabored  Chest wall:    No tenderness or deformity  Heart:    Regular rate and rhythm, S1 and S2 normal, no murmur, rub   or gallop  Abdomen:     Soft, non-tender, bowel sounds active all four quadrants,    no masses, no organomegaly  Extremities:   Extremities normal, atraumatic, no cyanosis or edema  Pulses:   2+ and symmetric all extremities  Skin:   Skin color, texture, turgor normal, no rashes or lesions  Lymph nodes:   Cervical, supraclavicular, and axillary nodes normal  Neurologic:   CNII-XII intact. Normal strength, sensation and reflexes      throughout    Labs on  Admission:   Basic Metabolic Panel:  Recent Labs Lab 01/03/13 0220  NA 136  K 3.5  CL 104  CO2 22  GLUCOSE 132*  BUN 6  CREATININE 0.76  CALCIUM 8.5   Liver Function Tests:  Recent Labs Lab 01/03/13 0220  AST 36  ALT 17  ALKPHOS 100  BILITOT 4.0*  PROT 7.3  ALBUMIN 3.7   CBC:  Recent Labs Lab 01/03/13 0220  WBC 19.6*  NEUTROABS 12.9*  HGB 7.7*  HCT 22.5*  MCV 93.4  PLT 572*   BNP: No components found with this basename: POCBNP,    Radiological Exams on Admission: Dg Chest 2 View  01/03/2013   CLINICAL DATA:  Sickle cell crisis.  EXAM: CHEST  2 VIEW  COMPARISON:  11/17/2012 CT  FINDINGS: Mild cardiac enlargement and central vascular prominence. Linear retrocardiac opacity. No pleural effusion or pneumothorax. Surgical clips right upper quadrant. Left chest wall Port-A-Cath with tip projecting over the mid SVC. No acute osseous finding.  IMPRESSION: Cardiomegaly and central vascular prominence, similar to prior.  Retrocardiac opacity has decreased compared to the prior and now appears linear, therefore favored to reflect atelectasis/ scarring.   Electronically Signed   By: Jearld Lesch M.D.   On: 01/03/2013 03:12      Assessment/Plan: Active Problems: Hb SS with early acute crisis: Pt with early acute crisis which has been insufficiently treated in the ambulatory setting. I will treat the patient with rapid re-dosing of IV Dilaudid  And re-assess after the 1 st hour. If he has had adequate decrease of pain to manage with oral analgesics, will discharge home on oral medications. If not will start PCA at individualized dose. Will also give IVF and a dose of Toradol.    Time spend: 38 minutes Code Status: Full Code Family Communication: N/A Disposition Plan: HoMe  Amori Colomb A., MD  Pager [phone]  If 7PM-7AM, please contact night-coverage [email or web address] Password [email or web address] 01/03/2013, 3:35 PM

## 2013-01-03 NOTE — Progress Notes (Signed)
Pt states pain is still 7/10; has experienced some relief; pt appears in no acute distress; pt talking on cell phone, drinking a soda, and watching TV; will continue to monitor

## 2013-01-03 NOTE — ED Provider Notes (Signed)
CSN: [id number]     Arrival date & time 01/03/13  0157 History   First MD Initiated Contact with Patient 01/03/13 0159     Chief Complaint  Patient presents with  . Sickle Cell Pain Crisis   (Consider location/radiation/quality/duration/timing/severity/associated sxs/prior Treatment) HPI [name] is a 32 y.o. male who presents emergency department complaining of sickle cell crisis pain. Patient states his pain began several days ago. States it's in bilateral ribs, lower back, and thighs. Patient is taking Dilaudid and MS Contin at home and he has had no relief in pain. Patient states this pain feels similar to his prior sickle cell pain. He denies any fever, chills. He denies any chest pain or abdominal pain. He states he does have pain in his ribs but thinks is related to his back pain. He denies any shortness of breath. Patient states his last sickle cell crisis was 2 weeks ago when he was admitted. He states he has helped better since discharge.  Past Medical History  Diagnosis Date  . Sickle cell anemia   . Blood transfusion   . 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 Hypokalemia   . Mood disorder   . Pulmonary embolism   . Avascular necrosis   . Leukocytosis     Chronic  . Thrombocytosis     Chronic  . Hypertension   . C. difficile colitis    Past Surgical History  Procedure Laterality Date  . Right hip replacement      08/2006  . Cholecystectomy      01/2008  . Porta cath placement    . Porta cath removal    . Umbilical hernia repair      01/2008  . Excision of left periauricular cyst      10/2009  . Excision of right ear lobe cyst with primary closur      11/2007  . Portacath placement  01/05/2012    Procedure: INSERTION PORT-A-CATH;  Surgeon: Adolph Pollack, MD;  Location: [facility] OR;  Service: General;  Laterality: N/A;  ultrasound guiced port a cath insertion with fluoroscopy   Family History  Problem Relation Age of Onset  . Sickle  cell anemia Mother   . Sickle cell anemia Father   . Sickle cell trait Brother    History  Substance Use Topics  . Smoking status: Former Smoker -- 13 years    Types: Cigarettes    Quit date: 07/08/2010  . Smokeless tobacco: Never Used  . Alcohol Use: No    Review of Systems  Constitutional: Negative for fever and chills.  Respiratory: Negative for cough, chest tightness and shortness of breath.   Cardiovascular: Positive for chest pain. Negative for palpitations and leg swelling.  Gastrointestinal: Negative for nausea, vomiting, abdominal pain, diarrhea and abdominal distention.  Genitourinary: Negative for dysuria, urgency, frequency and hematuria.  Musculoskeletal: Positive for arthralgias, back pain and myalgias. Negative for neck pain and neck stiffness.  Skin: Negative for rash.  Allergic/Immunologic: Negative for immunocompromised state.  Neurological: Negative for dizziness, weakness, light-headedness, numbness and headaches.    Allergies  Morphine and related  Home Medications   Current Outpatient Rx  Name  Route  Sig  Dispense  Refill  . celecoxib (CELEBREX) 200 MG capsule   Oral   Take 1 capsule (200 mg total) by mouth 2 (two) times daily.   60 capsule   1   . folic acid (FOLVITE) 1 MG tablet   Oral  Take 1 tablet (1 mg total) by mouth every morning.   30 tablet   11   . HYDROmorphone (DILAUDID) 4 MG tablet      Take 8 mg every 4 hours for next 24 hours then resume 4 mg every 4 hours as needed for pain.   90 tablet   0   . hydroxyurea (HYDREA) 500 MG capsule   Oral   Take 2 capsules (1,000 mg total) by mouth daily. May take with food to minimize GI side effects.   60 capsule   1   . morphine (MS CONTIN) 15 MG 12 hr tablet   Oral   Take 1 tablet (15 mg total) by mouth 2 (two) times daily.   60 tablet   0   . potassium chloride SA (K-DUR,KLOR-CON) 20 MEQ tablet   Oral   Take 1 tablet (20 mEq total) by mouth every morning.   30 tablet   3    . Rivaroxaban (XARELTO) 20 MG TABS tablet   Oral   Take 1 tablet (20 mg total) by mouth every morning.   30 tablet   2   . zolpidem (AMBIEN) 10 MG tablet   Oral   Take 1 tablet (10 mg total) by mouth at bedtime as needed for sleep.   30 tablet   2    BP 120/73  Pulse 89  Temp(Src) 98.1 F (36.7 C) (Oral)  Resp 16  Ht 6' (1.829 m)  Wt 173 lb (78.472 kg)  BMI 23.46 kg/m2  SpO2 99% Physical Exam  Nursing note and vitals reviewed. Constitutional: He is oriented to person, place, and time. He appears well-developed and well-nourished. No distress.  HENT:  Head: Normocephalic and atraumatic.  Eyes: Conjunctivae are normal.  Neck: Neck supple.  Cardiovascular: Normal rate, regular rhythm and normal heart sounds.   Pulmonary/Chest: Effort normal. No respiratory distress. He has no wheezes. He has no rales.  Abdominal: Soft. Bowel sounds are normal. He exhibits no distension. There is no tenderness. There is no rebound.  Musculoskeletal: He exhibits no edema.  Tenderness over posterior bilateral lower ribs, lower back muscles, bilateral anterior thighs. Dorsal pedal pulses intact.  Neurological: He is alert and oriented to person, place, and time.  Skin: Skin is warm and dry.    ED Course  Procedures (including critical care time) Labs Review Labs Reviewed  CBC WITH DIFFERENTIAL - Abnormal; Notable for the following:    WBC 19.6 (*)    RBC 2.41 (*)    Hemoglobin 7.7 (*)    HCT 22.5 (*)    RDW 20.3 (*)    Platelets 572 (*)    Neutro Abs 12.9 (*)    Lymphs Abs 4.1 (*)    Monocytes Absolute 2.0 (*)    All other components within normal limits  COMPREHENSIVE METABOLIC PANEL - Abnormal; Notable for the following:    Glucose, Bld 132 (*)    Total Bilirubin 4.0 (*)    All other components within normal limits  RETICULOCYTES - Abnormal; Notable for the following:    Retic Ct Pct 10.4 (*)    RBC. 2.41 (*)    Retic Count, Manual 250.6 (*)    All other components within  normal limits   Imaging Review Dg Chest 2 View  01/03/2013   CLINICAL DATA:  Sickle cell crisis.  EXAM: CHEST  2 VIEW  COMPARISON:  11/17/2012 CT  FINDINGS: Mild cardiac enlargement and central vascular prominence. Linear retrocardiac opacity. No pleural effusion  or pneumothorax. Surgical clips right upper quadrant. Left chest wall Port-A-Cath with tip projecting over the mid SVC. No acute osseous finding.  IMPRESSION: Cardiomegaly and central vascular prominence, similar to prior.  Retrocardiac opacity has decreased compared to the prior and now appears linear, therefore favored to reflect atelectasis/ scarring.   Electronically Signed   By: Jearld Lesch M.D.   On: 01/03/2013 03:12    EKG Interpretation   None       MDM   1. Sickle cell crisis      3:30 AM Pt reassessed. Pain not improved after receiving 2mg  of dilaudid. Will redose and add toradol.   5:29 AM Pt now received 4 doses of pain medications. Feeling better. Wants to try to go home. He has pain medications at home. Will follow up with PCP.   No signs of acute chest crisis at this time. Labs are at baseline. CXR negative.   Filed Vitals:   01/03/13 0200 01/03/13 0202 01/03/13 0528  BP: 120/73  106/63  Pulse: 89  78  Temp: 98.1 F (36.7 C)  98.7 F (37.1 C)  TempSrc: Oral  Oral  Resp: 16  18  Height:  6' (1.829 m)   Weight:  173 lb (78.472 kg)   SpO2: 99%  97%     Darrielle Pflieger A Mayer Vondrak, PA-C 01/03/13 0530

## 2013-01-03 NOTE — Progress Notes (Signed)
   Subjective:    Patient ID: [name], male    DOB: 1981-01-06, 32 y.o.   MRN: [id number]  HPI: Pt here sating that his girlfriend made him come to the Doctor because of the marks on his tongue-Pt has a geographic tongue.   However patient is also complaining of pain characteristic of his Sickle Cell. Pt was seen in the Ed last night for c/o pain in back, legs and ribs. He was treated in the ED and left as he did not want to be admitted and still does not want to be admitted for pain. He states that his pain is presently localized to the said areas and at an intensity of 7/10. He describes pain as throbbing and sharp in nature. He denies any associated symptoms. In particular he denies and SOB, DOE, Pleuritic pain, F/C, N/V/D. He is unable to identify any palliative or provocative features but thinks that the weather may be a trigger. He has taken his MS Contin and 1 dose of Dilaudid today     Review of Systems  Constitutional: Negative.   HENT: Negative.   Eyes: Negative.   Respiratory: Negative.   Cardiovascular: Negative.   Gastrointestinal: Negative.   Endocrine: Negative.   Genitourinary: Negative.   Musculoskeletal: Positive for arthralgias and myalgias.  Skin: Negative.   Allergic/Immunologic: Negative.   Neurological: Negative.   Hematological: Negative.   Psychiatric/Behavioral: Negative.        Objective:   Physical Exam See [facility] ([facility] admission note.       Assessment & Plan:  See [facility] ([facility]) admission note.

## 2013-01-03 NOTE — Progress Notes (Signed)
Lab drawn for type and screen and crossmatch for 1 unit of blood; pt advised to come back to center in the AM for blood transfusion; pt verbalizes understanding;

## 2013-01-03 NOTE — ED Notes (Signed)
Pt c/o back and leg pain, pt states this pain similar to normal [facility] pain

## 2013-01-03 NOTE — Discharge Summary (Signed)
Physician Discharge Summary  [name]:[id number] DOB: 09-Mar-1980 DOA: 01/03/2013  PCP: Gerald Marrocco A., MD  Admit date: 01/03/2013 Discharge date: 01/03/2013  Discharge Diagnoses:  Active Problems: 1. Hb SS with crisis 2. Chronic Pain 3. HTN 4. H/O pulmonary embolus 5. Anemia 6. Leukocytosis    Discharge Condition: Stable. Pain 5/10 at discharge  Disposition:  Follow-up Information   Follow up with Gerald Cotterman A., MD In 1 week.   Specialty:  Internal Medicine   Contact information:   [address] [place] [address] [phone]       Diet:  Low sodium.    Wt Readings from Last 3 Encounters:  01/03/13 187 lb (84.823 kg)  01/03/13 173 lb (78.472 kg)  12/22/12 170 lb 6.4 oz (77.293 kg)    History of present illness:  Pt was seen in the Ed last night for c/o pain in back, legs and ribs. He was treated in the ED and left as he did not want to be admitted and still does not want to be admitted for pain. He states that his pain is presently localized to the said areas and at an intensity of 7/10. He describes pain as throbbing and sharp in nature. He denies any associated symptoms. In particular he denies and SOB, DOE, Pleuritic pain, F/C, N/V/D. He is unable to identify any palliative or provocative features but thinks that the weather may be a trigger. He has taken his MS Contin and 1 dose of Dilaudid today.    Hospital Course:  Pt with early acute crisis which has been insufficiently treated in the ambulatory setting. He was treated initially with rapid re-dosing of IV Dilaudid and re-assessed after the1 st hour. He was started PCA at individualized dose in addition to his oral Dilaudid and this decreased his pain to 5/10 which is manageable at home. He was also treated with IVF and a dose of Toradol.    Discharge Exam: Filed Vitals:   01/03/13 1735  BP: 125/79  Pulse: 83  Temp: 98.3 F (36.8 C)  Resp: 19   Filed Vitals:   01/03/13 1530 01/03/13 1725 01/03/13 1735  BP: 112/74 118/75 125/79  Pulse: 78 78 83  Temp: 98.3 F (36.8 C) 98.4 F (36.9 C) 98.3 F (36.8 C)  TempSrc: Oral Oral Oral  Resp: 16 19 19   SpO2: 97% 93% 94%  General: Alert, awake, oriented x3, in no acute distress.  HEENT: [place]/AT PEERL, EOMI. anicteric.  Heart: Regular rate and rhythm, without murmurs, rubs, gallops.  Lungs: Clear to auscultation, no wheezing or rhonchi noted. No pleuritic pain.  Abdomen: Soft, nontender, nondistended, positive bowel sounds, no masses no hepatosplenomegaly noted.  Neuro: No focal neurological deficits noted cranial nerves II through XII grossly intact. Strength functional in bilateral upper and lower extremities.  Musculoskeletal: No warm swelling or erythema around joints, no spinal tenderness noted.  Psychiatric: Patient alert and oriented x3, good insight and cognition, good recent to remote recall. Marland Kitchen  Discharge Instructions  Discharge Orders   Future Orders Complete By Expires   Activity as tolerated - No restrictions  As directed    Diet - low sodium heart healthy  As directed        Medication List         celecoxib 200 MG capsule  Commonly known as:  CELEBREX  Take 1 capsule (200 mg total) by mouth 2 (two) times daily.     folic acid 1 MG tablet  Commonly known  as:  FOLVITE  Take 1 tablet (1 mg total) by mouth every morning.     HYDROmorphone 4 MG tablet  Commonly known as:  DILAUDID  Take 4 mg of dilaudid every 4 hours ATC for the next 24 hours then as needed for pain.     hydroxyurea 500 MG capsule  Commonly known as:  HYDREA  Take 2 capsules (1,000 mg total) by mouth daily. May take with food to minimize GI side effects.     morphine 15 MG 12 hr tablet  Commonly known as:  MS CONTIN  Take 1 tablet (15 mg total) by mouth 2 (two) times daily.     potassium chloride SA 20 MEQ tablet  Commonly known as:  K-DUR,KLOR-CON  Take 1 tablet (20 mEq total) by mouth every morning.      Rivaroxaban 20 MG Tabs tablet  Commonly known as:  XARELTO  Take 1 tablet (20 mg total) by mouth every morning.     zolpidem 10 MG tablet  Commonly known as:  AMBIEN  Take 1 tablet (10 mg total) by mouth at bedtime as needed for sleep.          The results of significant diagnostics from this hospitalization (including imaging, microbiology, ancillary and laboratory) are listed below for reference.    Significant Diagnostic Studies: Dg Chest 2 View  01/03/2013   CLINICAL DATA:  Sickle cell crisis.  EXAM: CHEST  2 VIEW  COMPARISON:  11/17/2012 CT  FINDINGS: Mild cardiac enlargement and central vascular prominence. Linear retrocardiac opacity. No pleural effusion or pneumothorax. Surgical clips right upper quadrant. Left chest wall Port-A-Cath with tip projecting over the mid SVC. No acute osseous finding.  IMPRESSION: Cardiomegaly and central vascular prominence, similar to prior.  Retrocardiac opacity has decreased compared to the prior and now appears linear, therefore favored to reflect atelectasis/ scarring.   Electronically Signed   By: Jearld Lesch M.D.   On: 01/03/2013 03:12    Microbiology: No results found for this or any previous visit (from the past 240 hour(s)).   Labs: Basic Metabolic Panel:  Recent Labs Lab 01/03/13 0220  NA 136  K 3.5  CL 104  CO2 22  GLUCOSE 132*  BUN 6  CREATININE 0.76  CALCIUM 8.5   Liver Function Tests:  Recent Labs Lab 01/03/13 0220  AST 36  ALT 17  ALKPHOS 100  BILITOT 4.0*  PROT 7.3  ALBUMIN 3.7   No results found for this basename: LIPASE, AMYLASE,  in the last 168 hours No results found for this basename: AMMONIA,  in the last 168 hours CBC:  Recent Labs Lab 01/03/13 0220  WBC 19.6*  NEUTROABS 12.9*  HGB 7.7*  HCT 22.5*  MCV 93.4  PLT 572*  Time coordinating discharge: greater than 30 minutes.  Signed:  Kysean Sweet A.  01/03/2013, 6:15 PM

## 2013-01-03 NOTE — Progress Notes (Signed)
CRITICAL VALUE ALERT  Critical value received:  Hgb 6.8  Date of notification:  01/03/2013  Time of notification:  1952  Critical value read back:yes  Nurse who received alert:  B. Katrinka Blazing  MD notified (1st page):  Dr. Ashley Royalty  Time of first page:  1952  MD notified (2nd page):  Time of second page:  Responding MD:  Dr. Deno Etienne   Time MD responded:  [phone]

## 2013-01-03 NOTE — Progress Notes (Signed)
Patient ID: [name], male   DOB: 1980-12-29, 32 y.o.   MRN: [id number] Pt alert and oriented upon discharge to home; pt states pain will be manageable at home with oral pain medications; port deaccessed per protocol with no complications noted; discharge instructions given, explained and signed; all questions answered; pt ambulating well and transportation available

## 2013-01-04 ENCOUNTER — Non-Acute Institutional Stay (HOSPITAL_COMMUNITY)
Admission: AD | Admit: 2013-01-04 | Discharge: 2013-01-04 | Disposition: A | Discharge status: Home / Self Care | Payer: Medicare Other | Source: Ambulatory Visit | Attending: Internal Medicine | Admitting: Internal Medicine

## 2013-01-04 ENCOUNTER — Encounter (HOSPITAL_COMMUNITY): Payer: Self-pay

## 2013-01-04 ENCOUNTER — Telehealth (HOSPITAL_COMMUNITY): Payer: Self-pay | Admitting: Hematology

## 2013-01-04 DIAGNOSIS — D599 Acquired hemolytic anemia, unspecified: Secondary | ICD-10-CM | POA: Insufficient documentation

## 2013-01-04 DIAGNOSIS — I517 Cardiomegaly: Secondary | ICD-10-CM | POA: Insufficient documentation

## 2013-01-04 DIAGNOSIS — D5700 Hb-SS disease with crisis, unspecified: Secondary | ICD-10-CM

## 2013-01-04 LAB — COMPREHENSIVE METABOLIC PANEL
ALT: 19 U/L (ref 0–53)
AST: 35 U/L (ref 0–37)
Albumin: 3.6 g/dL (ref 3.5–5.2)
Alkaline Phosphatase: 89 U/L (ref 39–117)
BUN: 8 mg/dL (ref 6–23)
CO2: 23 mEq/L (ref 19–32)
Calcium: 8.6 mg/dL (ref 8.4–10.5)
Chloride: 107 mEq/L (ref 96–112)
Creatinine, Ser: 0.68 mg/dL (ref 0.50–1.35)
GFR calc Af Amer: >90 mL/min (ref >90)
GFR calc non Af Amer: >90 mL/min (ref >90)
Glucose, Bld: 95 mg/dL (ref 70–99)
Potassium: 3.9 mEq/L (ref 3.5–5.1)
Sodium: 137 mEq/L (ref 135–145)
Total Bilirubin: 5.5 mg/dL — ABNORMAL HIGH (ref 0.3–1.2)
Total Protein: 6.9 g/dL (ref 6.0–8.3)

## 2013-01-04 MED ORDER — HYDROMORPHONE HCL PF 2 MG/ML IJ SOLN: 2.0000 mg | Freq: Once | INTRAMUSCULAR | Status: DC

## 2013-01-04 MED ORDER — ONDANSETRON HCL 4 MG/2ML IJ SOLN: 4.0000 mg | Freq: Four times a day (QID) | INTRAMUSCULAR | Status: DC | PRN

## 2013-01-04 MED ORDER — DEXTROSE-NACL 5-0.45 % IV SOLN
INTRAVENOUS | Status: DC
  Administered 2013-01-04: 17:00:00 via INTRAVENOUS

## 2013-01-04 MED ORDER — HYDROMORPHONE HCL PF 2 MG/ML IJ SOLN
2.0000 mg | Freq: Once | INTRAMUSCULAR | Status: AC
  Administered 2013-01-04: 2 mg via SUBCUTANEOUS
  Filled 2013-01-04: qty 1

## 2013-01-04 MED ORDER — HYDROMORPHONE HCL PF 2 MG/ML IJ SOLN
3.0000 mg | Freq: Once | INTRAMUSCULAR | Status: AC
  Administered 2013-01-04: 3 mg via SUBCUTANEOUS
  Filled 2013-01-04: qty 2

## 2013-01-04 MED ORDER — HYDROMORPHONE HCL PF 2 MG/ML IJ SOLN: 3.0000 mg | Freq: Once | INTRAMUSCULAR | Status: DC

## 2013-01-04 MED ORDER — HYDROMORPHONE 0.3 MG/ML IV SOLN
INTRAVENOUS | Status: DC
  Administered 2013-01-04: 17:00:00 via INTRAVENOUS
  Administered 2013-01-04: 6.93 mg via INTRAVENOUS
  Filled 2013-01-04: qty 25

## 2013-01-04 MED ORDER — SODIUM CHLORIDE 0.9 % IJ SOLN
10.0000 mL | INTRAMUSCULAR | Status: AC | PRN
  Administered 2013-01-04: 10 mL

## 2013-01-04 MED ORDER — HYDROMORPHONE HCL PF 2 MG/ML IJ SOLN
2.5000 mg | Freq: Once | INTRAMUSCULAR | Status: AC
  Administered 2013-01-04: 2.5 mg via SUBCUTANEOUS
  Filled 2013-01-04: qty 2

## 2013-01-04 MED ORDER — DIPHENHYDRAMINE HCL 25 MG PO CAPS
25.0000 mg | ORAL_CAPSULE | Freq: Four times a day (QID) | ORAL | Status: DC | PRN
  Administered 2013-01-04: 50 mg via ORAL
  Filled 2013-01-04: qty 2

## 2013-01-04 MED ORDER — HEPARIN SOD (PORK) LOCK FLUSH 100 UNIT/ML IV SOLN
500.0000 [IU] | INTRAVENOUS | Status: AC | PRN
  Administered 2013-01-04: 500 [IU]
  Filled 2013-01-04: qty 5

## 2013-01-04 MED ORDER — KETOROLAC TROMETHAMINE 30 MG/ML IJ SOLN
30.0000 mg | Freq: Four times a day (QID) | INTRAMUSCULAR | Status: DC | PRN
  Administered 2013-01-04: 30 mg via INTRAVENOUS
  Filled 2013-01-04: qty 1

## 2013-01-04 MED ORDER — SODIUM CHLORIDE 0.9 % IJ SOLN: 9.0000 mL | INTRAMUSCULAR | Status: DC | PRN

## 2013-01-04 MED ORDER — NALOXONE HCL 0.4 MG/ML IJ SOLN: 0.4000 mg | INTRAMUSCULAR | Status: DC | PRN

## 2013-01-04 NOTE — Progress Notes (Signed)
Pt to discharge home; pt rates pain at 6/10 but states he feels like he can manage pain at home; pt resting in chair, sleeping while caregiver not in room; alert and oriented; will continue to monitor

## 2013-01-04 NOTE — Telephone Encounter (Signed)
Called patient to advise that blood is ready, and he should be at the day hospital at 1:00 for transfusion.  Patient verbalizes understanding

## 2013-01-04 NOTE — ED Provider Notes (Signed)
Medical screening examination/treatment/procedure(s) were performed by non-physician practitioner and as supervising physician I was immediately available for consultation/collaboration.  EKG Interpretation   None        Toy Baker, MD 01/04/13 [phone]

## 2013-01-04 NOTE — Progress Notes (Signed)
Pt discharged to home; port deaccessed with no complications noted; pt alert and oriented; pt ambulatory; discharge instructions given, explained, and signed; all questions answered; no complications noted

## 2013-01-04 NOTE — H&P (Signed)
[facility] History and Physical  [name] AVW:[id number] DOB: 1980-04-09 DOA: 01/04/2013   PCP: [name],[name], MD   Chief Complaint: Pt here for blood transfusion but also states that he is having pain 7/10 in back and legs  HPI:Pt here for transfusion of 1 unit of PRBC's based on hemoglobin levels from yesterday. Pt also states that he is having pain in legs and back typical of Sickle Cell Crisis. He rates his pain as 7/10 and states that his pain escalated again this morning. He denies any N/V/D, F/C.   Review of Systems:  Constitutional: No weight loss, night sweats, Fevers, chills, fatigue.  HEENT: No headaches, dizziness, seizures, vision changes, difficulty swallowing,Tooth/dental problems,Sore throat, No sneezing, itching, ear ache, nasal congestion, post nasal drip,  Cardio-vascular: No chest pain, Orthopnea, PND, swelling in lower extremities, anasarca, dizziness, palpitations  GI: No heartburn, indigestion, abdominal pain, nausea, vomiting, diarrhea, change in bowel habits, loss of appetite  Resp: No shortness of breath with exertion or at rest. No excess mucus, no productive cough, No non-productive cough, No coughing up of blood.No change in color of mucus.No wheezing.No chest wall deformity  Skin: no rash or lesions.  GU: no dysuria, change in color of urine, no urgency or frequency. No flank pain.   Psych: No change in mood or affect. No depression or anxiety. No memory loss.    Past Medical History  Diagnosis Date  . Sickle cell anemia   . Blood transfusion   . 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 Hypokalemia   . Mood disorder   . Pulmonary embolism   . Avascular necrosis   . Leukocytosis     Chronic  . Thrombocytosis     Chronic  . Hypertension   . C. difficile colitis    Past Surgical History  Procedure Laterality Date  . Right hip replacement      08/2006  . Cholecystectomy      01/2008  . Porta  cath placement    . Porta cath removal    . Umbilical hernia repair      01/2008  . Excision of left periauricular cyst      10/2009  . Excision of right ear lobe cyst with primary closur      11/2007  . Portacath placement  01/05/2012    Procedure: INSERTION PORT-A-CATH;  Surgeon: Adolph Pollack, MD;  Location: [facility] OR;  Service: General;  Laterality: N/A;  ultrasound guiced port a cath insertion with fluoroscopy   Social History:  reports that he quit smoking about 2 years ago. His smoking use included Cigarettes. He smoked 0.00 packs per day for 13 years. He has never used smokeless tobacco. He reports that he does not drink alcohol or use illicit drugs.  Allergies  Allergen Reactions  . Morphine And Related Hives and Rash    Pt states he can take the pill form, but not IV and he is able to tolerate Dilaudid with no reactions.    Family History  Problem Relation Age of Onset  . Sickle cell anemia Mother   . Sickle cell anemia Father   . Sickle cell trait Brother     Prior to Admission medications   Medication Sig Start Date End Date Taking? Authorizing Provider  celecoxib (CELEBREX) 200 MG capsule Take 1 capsule (200 mg total) by mouth 2 (two) times daily. 11/19/12  Yes Altha Harm, MD  folic acid (FOLVITE) 1 MG  tablet Take 1 tablet (1 mg total) by mouth every morning. 06/05/12  Yes Altha Harm, MD  HYDROmorphone (DILAUDID) 4 MG tablet Take 4 mg of dilaudid every 4 hours ATC for the next 24 hours then as needed for pain. 01/03/13  Yes Altha Harm, MD  hydroxyurea (HYDREA) 500 MG capsule Take 2 capsules (1,000 mg total) by mouth daily. May take with food to minimize GI side effects. 11/19/12  Yes Altha Harm, MD  morphine (MS CONTIN) 15 MG 12 hr tablet Take 1 tablet (15 mg total) by mouth 2 (two) times daily. 12/24/12  Yes Rometta Emery, MD  potassium chloride SA (K-DUR,KLOR-CON) 20 MEQ tablet Take 1 tablet (20 mEq total) by mouth every morning.  07/31/12  Yes Altha Harm, MD  Rivaroxaban (XARELTO) 20 MG TABS tablet Take 1 tablet (20 mg total) by mouth every morning. 11/21/12  Yes Altha Harm, MD  zolpidem (AMBIEN) 10 MG tablet Take 1 tablet (10 mg total) by mouth at bedtime as needed for sleep. 11/28/12  Yes Grayce Sessions, NP   Physical Exam: Filed Vitals:   01/04/13 1342 01/04/13 1442 01/04/13 1542 01/04/13 1612  BP: 106/47 122/81 110/77 117/86  Pulse: 80 69 70 78  Temp: 98.5 F (36.9 C) 98.5 F (36.9 C) 98.7 F (37.1 C) 98.7 F (37.1 C)  TempSrc: Oral Oral Oral Oral  Resp: 18 16 18 18   SpO2: 96% 96% 98% 99%   BP 122/89  Pulse 77  Temp(Src) 98.5 F (36.9 C) (Oral)  Resp 14  SpO2 97%  General Appearance:    Alert, cooperative, no distress, appears stated age  Head:    Normocephalic, without obvious abnormality, atraumatic  Eyes:    PERRL, conjunctiva/corneas clear, EOM's intact, fundi    benign, both eyes anicteric.     Throat:   Lips, mucosa, and tongue normal; teeth and gums normal  Neck:   Supple, symmetrical, trachea midline, no adenopathy;       thyroid:  No enlargement/tenderness/nodules; no carotid   bruit or JVD  Back:     Symmetric, no curvature, ROM normal, no CVA tenderness  Lungs:     Clear to auscultation bilaterally, respirations unlabored  Chest wall:    No tenderness or deformity  Heart:    Regular rate and rhythm, S1 and S2 normal, no murmur, rub   or gallop  Abdomen:     Soft, non-tender, bowel sounds active all four quadrants,    no masses, no organomegaly  Extremities:   Extremities normal, atraumatic, no cyanosis or edema  Pulses:   2+ and symmetric all extremities  Skin:   Skin color, texture, turgor normal, no rashes or lesions  Lymph nodes:   Cervical, supraclavicular, and axillary nodes normal  Neurologic:   CNII-XII intact. Normal strength, sensation and reflexes      throughout    Labs on Admission:   Basic Metabolic Panel:  Recent Labs Lab 01/03/13 0220  NA  136  K 3.5  CL 104  CO2 22  GLUCOSE 132*  BUN 6  CREATININE 0.76  CALCIUM 8.5   Liver Function Tests:  Recent Labs Lab 01/03/13 0220  AST 36  ALT 17  ALKPHOS 100  BILITOT 4.0*  PROT 7.3  ALBUMIN 3.7   CBC:  Recent Labs Lab 01/03/13 0220 01/03/13 1823  WBC 19.6* 22.0*  NEUTROABS 12.9* 14.9*  HGB 7.7* 6.8*  HCT 22.5* 20.2*  MCV 93.4 93.1  PLT 572* 539*  BNP: No components found with this basename: POCBNP,    Radiological Exams on Admission: Dg Chest 2 View  01/03/2013   CLINICAL DATA:  Sickle cell crisis.  EXAM: CHEST  2 VIEW  COMPARISON:  11/17/2012 CT  FINDINGS: Mild cardiac enlargement and central vascular prominence. Linear retrocardiac opacity. No pleural effusion or pneumothorax. Surgical clips right upper quadrant. Left chest wall Port-A-Cath with tip projecting over the mid SVC. No acute osseous finding.  IMPRESSION: Cardiomegaly and central vascular prominence, similar to prior.  Retrocardiac opacity has decreased compared to the prior and now appears linear, therefore favored to reflect atelectasis/ scarring.   Electronically Signed   By: Jearld Lesch M.D.   On: 01/03/2013 03:12     Assessment/Plan: Active Problems: 1. Anemia: Pt has evidence of Hemolytic anemia in a setting of ongoing pain. He is at risk for further hypoxic changes. So he is receiving a transfusion of 1 unit of PRBC. However he is also at risk for increased pain associated with reperfusion injury. Will continue to treat pain acutely   2. Pt with acute crisis: Will treat with rapid re-dosing while blood transfusion in process and then transition to PCA. Will also treat with Toradol for inflammatory process and hydrate with hypotonic fluid for intracellular rehydration.   Time spend: 40 minutes Code Status: Full Code Family Communication: N/A Disposition Plan: Home at time of discharge.  [name],[name], MD  Pager [phone]  If 7PM-7AM, please contact  night-coverage [email or web address] Password [facility] 01/04/2013, 4:13 PM

## 2013-01-04 NOTE — Progress Notes (Signed)
Pt arrived to receive one unit of PRBCs per order; pt alert and oriented; vital signs WNL; will continue to monitor

## 2013-01-05 LAB — TYPE AND SCREEN: Unit division: 0

## 2013-01-10 ENCOUNTER — Encounter (HOSPITAL_COMMUNITY): Payer: Self-pay | Admitting: *Deleted

## 2013-01-10 ENCOUNTER — Non-Acute Institutional Stay (HOSPITAL_COMMUNITY)
Admission: AD | Admit: 2013-01-10 | Discharge: 2013-01-10 | Disposition: A | Discharge status: Home / Self Care | Payer: Medicare Other | Source: Home / Self Care | Attending: Internal Medicine | Admitting: Internal Medicine

## 2013-01-10 DIAGNOSIS — F1210 Cannabis abuse, uncomplicated: Secondary | ICD-10-CM | POA: Diagnosis present

## 2013-01-10 DIAGNOSIS — I10 Essential (primary) hypertension: Secondary | ICD-10-CM | POA: Insufficient documentation

## 2013-01-10 DIAGNOSIS — D5700 Hb-SS disease with crisis, unspecified: Principal | ICD-10-CM | POA: Diagnosis present

## 2013-01-10 DIAGNOSIS — Z87891 Personal history of nicotine dependence: Secondary | ICD-10-CM | POA: Insufficient documentation

## 2013-01-10 DIAGNOSIS — F39 Unspecified mood [affective] disorder: Secondary | ICD-10-CM | POA: Diagnosis present

## 2013-01-10 DIAGNOSIS — M8700 Idiopathic aseptic necrosis of unspecified bone: Secondary | ICD-10-CM | POA: Diagnosis present

## 2013-01-10 DIAGNOSIS — Z86718 Personal history of other venous thrombosis and embolism: Secondary | ICD-10-CM

## 2013-01-10 DIAGNOSIS — Z832 Family history of diseases of the blood and blood-forming organs and certain disorders involving the immune mechanism: Secondary | ICD-10-CM

## 2013-01-10 DIAGNOSIS — Z79899 Other long term (current) drug therapy: Secondary | ICD-10-CM

## 2013-01-10 DIAGNOSIS — E559 Vitamin D deficiency, unspecified: Secondary | ICD-10-CM | POA: Diagnosis present

## 2013-01-10 DIAGNOSIS — D72829 Elevated white blood cell count, unspecified: Secondary | ICD-10-CM | POA: Diagnosis present

## 2013-01-10 DIAGNOSIS — Z7901 Long term (current) use of anticoagulants: Secondary | ICD-10-CM

## 2013-01-10 DIAGNOSIS — D473 Essential (hemorrhagic) thrombocythemia: Secondary | ICD-10-CM | POA: Diagnosis present

## 2013-01-10 DIAGNOSIS — I2782 Chronic pulmonary embolism: Secondary | ICD-10-CM | POA: Diagnosis present

## 2013-01-10 DIAGNOSIS — E860 Dehydration: Secondary | ICD-10-CM | POA: Diagnosis present

## 2013-01-10 DIAGNOSIS — G4700 Insomnia, unspecified: Secondary | ICD-10-CM | POA: Diagnosis present

## 2013-01-10 DIAGNOSIS — Z96649 Presence of unspecified artificial hip joint: Secondary | ICD-10-CM

## 2013-01-10 DIAGNOSIS — Z86711 Personal history of pulmonary embolism: Secondary | ICD-10-CM | POA: Insufficient documentation

## 2013-01-10 LAB — CBC WITH DIFFERENTIAL/PLATELET
Basophils Absolute: 0.1 10*3/uL (ref 0.0–0.1)
Basophils Relative: 1 % (ref 0–1)
Eosinophils Relative: 2 % (ref 0–5)
HCT: 25.4 % — ABNORMAL LOW (ref 39.0–52.0)
Hemoglobin: 8.7 g/dL — ABNORMAL LOW (ref 13.0–17.0)
Lymphocytes Relative: 13 % (ref 12–46)
MCH: 31.9 pg (ref 26.0–34.0)
MCHC: 34.3 g/dL (ref 30.0–36.0)
Monocytes Absolute: 1.8 10*3/uL — ABNORMAL HIGH (ref 0.1–1.0)
Neutro Abs: 9.4 10*3/uL — ABNORMAL HIGH (ref 1.7–7.7)
Platelets: 588 10*3/uL — ABNORMAL HIGH (ref 150–400)
RDW: 19 % — ABNORMAL HIGH (ref 11.5–15.5)
WBC: 13.1 10*3/uL — ABNORMAL HIGH (ref 4.0–10.5)

## 2013-01-10 MED ORDER — NALOXONE HCL 0.4 MG/ML IJ SOLN: 0.4000 mg | INTRAMUSCULAR | Status: DC | PRN

## 2013-01-10 MED ORDER — SODIUM CHLORIDE 0.9 % IJ SOLN: 10.0000 mL | INTRAMUSCULAR | Status: DC | PRN

## 2013-01-10 MED ORDER — SODIUM CHLORIDE 0.9 % IJ SOLN
INTRAMUSCULAR | Status: AC
  Administered 2013-01-10: 10 mL
  Filled 2013-01-10: qty 10

## 2013-01-10 MED ORDER — SODIUM CHLORIDE 0.9 % IJ SOLN: 9.0000 mL | INTRAMUSCULAR | Status: DC | PRN

## 2013-01-10 MED ORDER — ONDANSETRON HCL 4 MG/2ML IJ SOLN: 4.0000 mg | Freq: Four times a day (QID) | INTRAMUSCULAR | Status: DC | PRN

## 2013-01-10 MED ORDER — HEPARIN SOD (PORK) LOCK FLUSH 100 UNIT/ML IV SOLN
500.0000 [IU] | INTRAVENOUS | Status: DC | PRN
  Filled 2013-01-10: qty 5

## 2013-01-10 MED ORDER — HYDROMORPHONE HCL PF 2 MG/ML IJ SOLN
2.5000 mg | Freq: Once | INTRAMUSCULAR | Status: AC
  Administered 2013-01-10: 2.5 mg via INTRAVENOUS
  Filled 2013-01-10: qty 2

## 2013-01-10 MED ORDER — HYDROMORPHONE HCL PF 2 MG/ML IJ SOLN
3.0000 mg | Freq: Once | INTRAMUSCULAR | Status: AC
  Administered 2013-01-10: 3 mg via INTRAVENOUS
  Filled 2013-01-10: qty 2

## 2013-01-10 MED ORDER — HYDROMORPHONE 0.3 MG/ML IV SOLN
INTRAVENOUS | Status: DC
  Administered 2013-01-10: 7.5 mg via INTRAVENOUS
  Administered 2013-01-10: 7.38 mg via INTRAVENOUS
  Administered 2013-01-10: 11:00:00 via INTRAVENOUS
  Filled 2013-01-10 (×3): qty 25

## 2013-01-10 MED ORDER — DIPHENHYDRAMINE HCL 25 MG PO CAPS
25.0000 mg | ORAL_CAPSULE | Freq: Four times a day (QID) | ORAL | Status: DC | PRN
  Administered 2013-01-10: 50 mg via ORAL
  Filled 2013-01-10: qty 2

## 2013-01-10 MED ORDER — KETOROLAC TROMETHAMINE 30 MG/ML IJ SOLN
30.0000 mg | Freq: Four times a day (QID) | INTRAMUSCULAR | Status: DC
  Administered 2013-01-10 (×2): 30 mg via INTRAVENOUS
  Filled 2013-01-10 (×2): qty 1

## 2013-01-10 MED ORDER — DEXTROSE-NACL 5-0.45 % IV SOLN
INTRAVENOUS | Status: DC
  Administered 2013-01-10: 09:00:00 via INTRAVENOUS

## 2013-01-10 MED ORDER — HYDROMORPHONE HCL PF 2 MG/ML IJ SOLN
2.0000 mg | Freq: Once | INTRAMUSCULAR | Status: AC
  Administered 2013-01-10: 2 mg via INTRAVENOUS
  Filled 2013-01-10: qty 1

## 2013-01-10 MED ORDER — MORPHINE SULFATE ER 15 MG PO TBCR
15.0000 mg | EXTENDED_RELEASE_TABLET | Freq: Two times a day (BID) | ORAL | Status: DC
  Administered 2013-01-10: 15 mg via ORAL
  Filled 2013-01-10: qty 1

## 2013-01-10 MED ORDER — RIVAROXABAN 20 MG PO TABS
20.0000 mg | ORAL_TABLET | Freq: Every morning | ORAL | Status: DC
  Administered 2013-01-10: 20 mg via ORAL
  Filled 2013-01-10: qty 1

## 2013-01-10 MED ORDER — POTASSIUM CHLORIDE CRYS ER 20 MEQ PO TBCR
20.0000 meq | EXTENDED_RELEASE_TABLET | Freq: Every morning | ORAL | Status: DC
  Administered 2013-01-10: 20 meq via ORAL
  Filled 2013-01-10: qty 1

## 2013-01-10 NOTE — Progress Notes (Signed)
Patient discharged home, VSS. Patient appears to be comfortable, talking on phone and singing songs. Patient states feels better. Discharge instructions reviewed. Patient to call sickle cell office to schedule follow up appointment. Patient left day hospital with belongings ambulatory via self.

## 2013-01-10 NOTE — H&P (Signed)
[facility] History and Physical  [name]:[id number] DOB: 1981/01/03 DOA: 01/10/2013   PCP: Claire Bridge A., MD   Chief Complaint: Pain in left forearm, ribs and legs x 2 days  HPI:Pt was seen in the day hospital last Friday 6 days ago and treated for acute sickle cell pain. He states that he was doing well until last night when he started having pain in the ribs, legs and forearm. He tried to manage with his oral medications but pain still remianed at an intensity of 8/10. He presents today for acute pain crisis which is typical of his sickle cell pain. He describes it as throbbing in nature. There are no associated symptoms. Pt does report that since he has been taking his Hydrea on a regular basis he has had some GI upset. I have advised him to take with food.   Review of Systems:  Constitutional: No weight loss, night sweats, Fevers, chills, fatigue.  HEENT: No headaches, dizziness, seizures, vision changes, difficulty swallowing,Tooth/dental problems,Sore throat, No sneezing, itching, ear ache, nasal congestion, post nasal drip,  Cardio-vascular: No chest pain, Orthopnea, PND, swelling in lower extremities, anasarca, dizziness, palpitations  GI: No heartburn, indigestion, abdominal pain, vomiting, diarrhea, change in bowel habits, loss of appetite. He as had some nausea. Resp: No shortness of breath with exertion or at rest. No excess mucus, no productive cough, No non-productive cough, No coughing up of blood.No change in color of mucus.No wheezing.No chest wall deformity  Skin: no rash or lesions.  GU: no dysuria, change in color of urine, no urgency or frequency. No flank pain.  Psych: No change in mood or affect. No depression or anxiety. No memory loss.    Past Medical History  Diagnosis Date  . Sickle cell anemia   . Blood transfusion   . 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 Hypokalemia   . Mood disorder   .  Pulmonary embolism   . Avascular necrosis   . Leukocytosis     Chronic  . Thrombocytosis     Chronic  . Hypertension   . C. difficile colitis    Past Surgical History  Procedure Laterality Date  . Right hip replacement      08/2006  . Cholecystectomy      01/2008  . Porta cath placement    . Porta cath removal    . Umbilical hernia repair      01/2008  . Excision of left periauricular cyst      10/2009  . Excision of right ear lobe cyst with primary closur      11/2007  . Portacath placement  01/05/2012    Procedure: INSERTION PORT-A-CATH;  Surgeon: Adolph Pollack, MD;  Location: [facility] OR;  Service: General;  Laterality: N/A;  ultrasound guiced port a cath insertion with fluoroscopy   Social History:  reports that he quit smoking about 2 years ago. His smoking use included Cigarettes. He smoked 0.00 packs per day for 13 years. He has never used smokeless tobacco. He reports that he does not drink alcohol or use illicit drugs.  Allergies  Allergen Reactions  . Morphine And Related Hives and Rash    Pt states he can take the pill form, but not IV and he is able to tolerate Dilaudid with no reactions.    Family History  Problem Relation Age of Onset  . Sickle cell anemia Mother   . Sickle cell anemia Father   .  Sickle cell trait Brother     Prior to Admission medications   Medication Sig Start Date End Date Taking? Authorizing Provider  celecoxib (CELEBREX) 200 MG capsule Take 1 capsule (200 mg total) by mouth 2 (two) times daily. 11/19/12   Altha Harm, MD  folic acid (FOLVITE) 1 MG tablet Take 1 tablet (1 mg total) by mouth every morning. 06/05/12   Altha Harm, MD  HYDROmorphone (DILAUDID) 4 MG tablet Take 4 mg of dilaudid every 4 hours ATC for the next 24 hours then as needed for pain. 01/03/13   Altha Harm, MD  hydroxyurea (HYDREA) 500 MG capsule Take 2 capsules (1,000 mg total) by mouth daily. May take with food to minimize GI side effects. 11/19/12    Altha Harm, MD  morphine (MS CONTIN) 15 MG 12 hr tablet Take 1 tablet (15 mg total) by mouth 2 (two) times daily. 12/24/12   Rometta Emery, MD  potassium chloride SA (K-DUR,KLOR-CON) 20 MEQ tablet Take 1 tablet (20 mEq total) by mouth every morning. 07/31/12   Altha Harm, MD  Rivaroxaban (XARELTO) 20 MG TABS tablet Take 1 tablet (20 mg total) by mouth every morning. 11/21/12   Altha Harm, MD  zolpidem (AMBIEN) 10 MG tablet Take 1 tablet (10 mg total) by mouth at bedtime as needed for sleep. 11/28/12   Grayce Sessions, NP   Physical Exam: Filed Vitals:   01/10/13 0845  BP: 130/79  Temp: 97.9 F (36.6 C)  TempSrc: Oral  Resp: 16  SpO2: 100%   BP 129/68  Pulse 81  Temp(Src) 98.2 F (36.8 C) (Oral)  Resp 17  SpO2 95%  General Appearance:    Alert, cooperative, no distress, appears stated age  Head:    Normocephalic, without obvious abnormality, atraumatic  Eyes:    PERRL, conjunctiva/corneas clear, EOM's intact, fundi    benign, both eyes, anicteric  Throat:   Lips, mucosa, and tongue normal; teeth and gums normal  Neck:   Supple, symmetrical, trachea midline, no adenopathy;       thyroid:  No enlargement/tenderness/nodules; no carotid   bruit or JVD  Back:     Symmetric, no curvature, ROM normal, no CVA tenderness  Lungs:     Clear to auscultation bilaterally, respirations unlabored  Chest wall:    No tenderness or deformity  Heart:    Regular rate and rhythm, S1 and S2 normal, no murmur, rub   or gallop  Abdomen:     Soft, non-tender, bowel sounds active all four quadrants,    no masses, no organomegaly  Extremities:   Extremities normal, atraumatic, no cyanosis or edema  Pulses:   2+ and symmetric all extremities  Skin:   Skin color, texture, turgor normal, no rashes or lesions  Lymph nodes:   Cervical, supraclavicular, and axillary nodes normal  Neurologic:   CNII-XII intact. Normal strength, sensation and reflexes      throughout    Labs  on Admission:   Basic Metabolic Panel:  Recent Labs Lab 01/04/13 1630  NA 137  K 3.9  CL 107  CO2 23  GLUCOSE 95  BUN 8  CREATININE 0.68  CALCIUM 8.6   Liver Function Tests:  Recent Labs Lab 01/04/13 1630  AST 35  ALT 19  ALKPHOS 89  BILITOT 5.5*  PROT 6.9  ALBUMIN 3.6   CBC:  Recent Labs Lab 01/03/13 1823  WBC 22.0*  NEUTROABS 14.9*  HGB 6.8*  HCT 20.2*  MCV 93.1  PLT 539*    Assessment/Plan: Active Problems: Hb SS with early Acute Crisis: Will treat with IV Dilaudid via PCA. IVF and Toradol. Also heat as adjunctive treatment. I have discussed possible hospitalization with patient and he feels that his pain can be controlled with acute management in the Day Hospital without need for hospitalization.  Anticipate discharge home this afternoon.   Time spend: 35 minutes Code Status: Full Code Family Communication: N/A Disposition Plan: Home at time of discharge  Trinh Sanjose A., MD  Pager [phone]  If 7PM-7AM, please contact night-coverage [email or web address] Password [email or web address] 01/10/2013, 9:00 AM

## 2013-01-13 ENCOUNTER — Emergency Department (HOSPITAL_COMMUNITY): Payer: Medicare Other

## 2013-01-13 ENCOUNTER — Encounter (HOSPITAL_COMMUNITY): Payer: Self-pay | Admitting: Emergency Medicine

## 2013-01-13 ENCOUNTER — Inpatient Hospital Stay (HOSPITAL_COMMUNITY)
Admission: EM | Admit: 2013-01-13 | Discharge: 2013-01-21 | DRG: 812 | Disposition: A | Discharge status: Home / Self Care | Payer: Medicare Other | Attending: Internal Medicine | Admitting: Internal Medicine

## 2013-01-13 DIAGNOSIS — D473 Essential (hemorrhagic) thrombocythemia: Secondary | ICD-10-CM

## 2013-01-13 DIAGNOSIS — I10 Essential (primary) hypertension: Secondary | ICD-10-CM | POA: Diagnosis present

## 2013-01-13 DIAGNOSIS — D72829 Elevated white blood cell count, unspecified: Secondary | ICD-10-CM | POA: Diagnosis not present

## 2013-01-13 DIAGNOSIS — F39 Unspecified mood [affective] disorder: Secondary | ICD-10-CM | POA: Diagnosis present

## 2013-01-13 DIAGNOSIS — F1210 Cannabis abuse, uncomplicated: Secondary | ICD-10-CM

## 2013-01-13 DIAGNOSIS — R071 Chest pain on breathing: Secondary | ICD-10-CM | POA: Diagnosis not present

## 2013-01-13 DIAGNOSIS — D5700 Hb-SS disease with crisis, unspecified: Secondary | ICD-10-CM | POA: Diagnosis present

## 2013-01-13 DIAGNOSIS — R0789 Other chest pain: Secondary | ICD-10-CM | POA: Diagnosis present

## 2013-01-13 DIAGNOSIS — D571 Sickle-cell disease without crisis: Secondary | ICD-10-CM | POA: Diagnosis present

## 2013-01-13 DIAGNOSIS — Z86711 Personal history of pulmonary embolism: Secondary | ICD-10-CM | POA: Diagnosis present

## 2013-01-13 DIAGNOSIS — F1290 Cannabis use, unspecified, uncomplicated: Secondary | ICD-10-CM | POA: Diagnosis present

## 2013-01-13 DIAGNOSIS — R0781 Pleurodynia: Secondary | ICD-10-CM | POA: Diagnosis present

## 2013-01-13 DIAGNOSIS — M8700 Idiopathic aseptic necrosis of unspecified bone: Secondary | ICD-10-CM | POA: Diagnosis present

## 2013-01-13 DIAGNOSIS — D75839 Thrombocytosis, unspecified: Secondary | ICD-10-CM | POA: Diagnosis present

## 2013-01-13 DIAGNOSIS — Z7901 Long term (current) use of anticoagulants: Secondary | ICD-10-CM

## 2013-01-13 DIAGNOSIS — M545 Low back pain, unspecified: Secondary | ICD-10-CM | POA: Diagnosis present

## 2013-01-13 DIAGNOSIS — E860 Dehydration: Secondary | ICD-10-CM | POA: Diagnosis present

## 2013-01-13 DIAGNOSIS — R079 Chest pain, unspecified: Secondary | ICD-10-CM | POA: Diagnosis not present

## 2013-01-13 HISTORY — DX: Asplenia (congenital): Q89.01

## 2013-01-13 HISTORY — DX: Hyposplenism: D73.0

## 2013-01-13 HISTORY — DX: Cannabis use, unspecified, uncomplicated: F12.90

## 2013-01-13 HISTORY — DX: Long term (current) use of anticoagulants: Z79.01

## 2013-01-13 LAB — CBC WITH DIFFERENTIAL/PLATELET
Basophils Absolute: 0.2 10*3/uL — ABNORMAL HIGH (ref 0.0–0.1)
Basophils Relative: 1 % (ref 0–1)
Eosinophils Absolute: 0.2 10*3/uL (ref 0.0–0.7)
HCT: 24 % — ABNORMAL LOW (ref 39.0–52.0)
MCH: 31 pg (ref 26.0–34.0)
MCHC: 32.9 g/dL (ref 30.0–36.0)
Monocytes Absolute: 2.1 10*3/uL — ABNORMAL HIGH (ref 0.1–1.0)
Neutro Abs: 13.8 10*3/uL — ABNORMAL HIGH (ref 1.7–7.7)
Neutrophils Relative %: 74 % (ref 43–77)
Platelets: 565 10*3/uL — ABNORMAL HIGH (ref 150–400)
RBC: 2.55 MIL/uL — ABNORMAL LOW (ref 4.22–5.81)
RDW: 19.2 % — ABNORMAL HIGH (ref 11.5–15.5)
WBC: 18.7 10*3/uL — ABNORMAL HIGH (ref 4.0–10.5)

## 2013-01-13 LAB — RAPID URINE DRUG SCREEN, HOSP PERFORMED
Amphetamines: NOT DETECTED
Opiates: POSITIVE — AB

## 2013-01-13 LAB — COMPREHENSIVE METABOLIC PANEL
ALT: 5 U/L (ref 0–53)
Albumin: 4.1 g/dL (ref 3.5–5.2)
Alkaline Phosphatase: 98 U/L (ref 39–117)
Calcium: 8.9 mg/dL (ref 8.4–10.5)
Chloride: 101 mEq/L (ref 96–112)
GFR calc Af Amer: >90 mL/min (ref >90)
Glucose, Bld: 106 mg/dL — ABNORMAL HIGH (ref 70–99)
Sodium: 135 mEq/L (ref 135–145)
Total Bilirubin: 3.9 mg/dL — ABNORMAL HIGH (ref 0.3–1.2)
Total Protein: 7.7 g/dL (ref 6.0–8.3)

## 2013-01-13 LAB — RETICULOCYTES: Retic Count, Absolute: 145.4 10*3/uL (ref 19.0–186.0)

## 2013-01-13 LAB — MAGNESIUM: Magnesium: 1.9 mg/dL (ref 1.5–2.5)

## 2013-01-13 LAB — URINALYSIS, ROUTINE W REFLEX MICROSCOPIC
Glucose, UA: NEGATIVE mg/dL
Hgb urine dipstick: NEGATIVE
Protein, ur: NEGATIVE mg/dL
Specific Gravity, Urine: 1.012 (ref 1.005–1.030)

## 2013-01-13 MED ORDER — RIVAROXABAN 20 MG PO TABS
20.0000 mg | ORAL_TABLET | Freq: Every morning | ORAL | Status: DC
  Administered 2013-01-14 – 2013-01-21 (×8): 20 mg via ORAL
  Filled 2013-01-13 (×8): qty 1

## 2013-01-13 MED ORDER — SODIUM CHLORIDE 0.9 % IJ SOLN: 9.0000 mL | INTRAMUSCULAR | Status: DC | PRN

## 2013-01-13 MED ORDER — ACETAMINOPHEN 650 MG RE SUPP: 650.0000 mg | RECTAL | Status: DC | PRN

## 2013-01-13 MED ORDER — FOLIC ACID 1 MG PO TABS
1.0000 mg | ORAL_TABLET | Freq: Every morning | ORAL | Status: DC
  Administered 2013-01-13 – 2013-01-21 (×9): 1 mg via ORAL
  Filled 2013-01-13 (×9): qty 1

## 2013-01-13 MED ORDER — HYDROXYUREA 500 MG PO CAPS: 500.0000 mg | ORAL_CAPSULE | Freq: Two times a day (BID) | ORAL | Status: DC

## 2013-01-13 MED ORDER — HYDROMORPHONE HCL PF 2 MG/ML IJ SOLN
2.0000 mg | Freq: Once | INTRAMUSCULAR | Status: AC
  Administered 2013-01-13: 2 mg via INTRAVENOUS
  Filled 2013-01-13: qty 1

## 2013-01-13 MED ORDER — POTASSIUM CHLORIDE CRYS ER 20 MEQ PO TBCR
20.0000 meq | EXTENDED_RELEASE_TABLET | Freq: Every morning | ORAL | Status: DC
  Administered 2013-01-14 – 2013-01-21 (×8): 20 meq via ORAL
  Filled 2013-01-13 (×8): qty 1

## 2013-01-13 MED ORDER — ONDANSETRON HCL 4 MG/2ML IJ SOLN
4.0000 mg | Freq: Once | INTRAMUSCULAR | Status: AC
  Administered 2013-01-13: 4 mg via INTRAVENOUS
  Filled 2013-01-13: qty 2

## 2013-01-13 MED ORDER — PANTOPRAZOLE SODIUM 40 MG IV SOLR
40.0000 mg | Freq: Every day | INTRAVENOUS | Status: DC
  Filled 2013-01-13 (×3): qty 40

## 2013-01-13 MED ORDER — FOLIC ACID 1 MG PO TABS: 1.0000 mg | ORAL_TABLET | Freq: Every morning | ORAL | Status: DC

## 2013-01-13 MED ORDER — FOLIC ACID 1 MG PO TABS: 1.0000 mg | ORAL_TABLET | Freq: Every day | ORAL | Status: DC

## 2013-01-13 MED ORDER — ONDANSETRON HCL 4 MG PO TABS: 4.0000 mg | ORAL_TABLET | ORAL | Status: DC | PRN

## 2013-01-13 MED ORDER — SENNOSIDES-DOCUSATE SODIUM 8.6-50 MG PO TABS
1.0000 | ORAL_TABLET | Freq: Two times a day (BID) | ORAL | Status: DC
  Administered 2013-01-13 – 2013-01-21 (×7): 1 via ORAL
  Filled 2013-01-13 (×18): qty 1

## 2013-01-13 MED ORDER — DEXTROSE-NACL 5-0.45 % IV SOLN
INTRAVENOUS | Status: DC
  Administered 2013-01-13 – 2013-01-14 (×3): via INTRAVENOUS
  Administered 2013-01-15: 125 mL/h via INTRAVENOUS
  Administered 2013-01-18: 17:00:00 via INTRAVENOUS
  Administered 2013-01-20: 20 mL/h via INTRAVENOUS

## 2013-01-13 MED ORDER — HYDROXYUREA 500 MG PO CAPS
500.0000 mg | ORAL_CAPSULE | Freq: Two times a day (BID) | ORAL | Status: DC
  Administered 2013-01-13 – 2013-01-21 (×16): 500 mg via ORAL
  Filled 2013-01-13 (×20): qty 1

## 2013-01-13 MED ORDER — MORPHINE SULFATE ER 15 MG PO TBCR
15.0000 mg | EXTENDED_RELEASE_TABLET | Freq: Two times a day (BID) | ORAL | Status: DC
  Administered 2013-01-13 – 2013-01-21 (×16): 15 mg via ORAL
  Filled 2013-01-13 (×16): qty 1

## 2013-01-13 MED ORDER — ACETAMINOPHEN 325 MG PO TABS: 650.0000 mg | ORAL_TABLET | ORAL | Status: DC | PRN

## 2013-01-13 MED ORDER — PANTOPRAZOLE SODIUM 40 MG PO TBEC
40.0000 mg | DELAYED_RELEASE_TABLET | Freq: Every day | ORAL | Status: DC
  Administered 2013-01-13 – 2013-01-21 (×9): 40 mg via ORAL
  Filled 2013-01-13 (×9): qty 1

## 2013-01-13 MED ORDER — NALOXONE HCL 0.4 MG/ML IJ SOLN: 0.4000 mg | INTRAMUSCULAR | Status: DC | PRN

## 2013-01-13 MED ORDER — HYDROMORPHONE 0.3 MG/ML IV SOLN
INTRAVENOUS | Status: DC
  Administered 2013-01-13: 7.64 mg via INTRAVENOUS
  Administered 2013-01-13: 6.84 mg via INTRAVENOUS
  Administered 2013-01-13: 19:00:00 via INTRAVENOUS
  Administered 2013-01-14: 1.5 mg via INTRAVENOUS
  Administered 2013-01-14: 6.65 mg via INTRAVENOUS
  Administered 2013-01-14: 1.6 mg via INTRAVENOUS
  Administered 2013-01-14 (×2): via INTRAVENOUS
  Administered 2013-01-14: 7.43 mg via INTRAVENOUS
  Filled 2013-01-13 (×5): qty 25

## 2013-01-13 MED ORDER — SODIUM CHLORIDE 0.9 % IV SOLN
Freq: Once | INTRAVENOUS | Status: AC
  Administered 2013-01-13: 14:00:00 via INTRAVENOUS

## 2013-01-13 MED ORDER — SODIUM CHLORIDE 0.9 % IV BOLUS (SEPSIS)
1000.0000 mL | Freq: Once | INTRAVENOUS | Status: AC
  Administered 2013-01-13: 1000 mL via INTRAVENOUS

## 2013-01-13 MED ORDER — KETOROLAC TROMETHAMINE 30 MG/ML IJ SOLN
30.0000 mg | Freq: Four times a day (QID) | INTRAMUSCULAR | Status: AC
  Administered 2013-01-13 – 2013-01-18 (×20): 30 mg via INTRAVENOUS
  Filled 2013-01-13 (×22): qty 1

## 2013-01-13 MED ORDER — DIPHENHYDRAMINE HCL 50 MG/ML IJ SOLN: 12.5000 mg | INTRAMUSCULAR | Status: DC | PRN

## 2013-01-13 MED ORDER — ONDANSETRON HCL 4 MG/2ML IJ SOLN: 4.0000 mg | INTRAMUSCULAR | Status: DC | PRN

## 2013-01-13 MED ORDER — ZOLPIDEM TARTRATE 5 MG PO TABS
5.0000 mg | ORAL_TABLET | Freq: Every evening | ORAL | Status: DC | PRN
  Administered 2013-01-13 – 2013-01-17 (×4): 5 mg via ORAL
  Filled 2013-01-13 (×4): qty 1

## 2013-01-13 MED ORDER — DIPHENHYDRAMINE HCL 50 MG/ML IJ SOLN
12.5000 mg | Freq: Once | INTRAMUSCULAR | Status: AC
  Administered 2013-01-13: 12.5 mg via INTRAVENOUS
  Filled 2013-01-13: qty 1

## 2013-01-13 MED ORDER — DIPHENHYDRAMINE HCL 25 MG PO CAPS
25.0000 mg | ORAL_CAPSULE | ORAL | Status: DC | PRN
  Administered 2013-01-20: 25 mg via ORAL
  Filled 2013-01-13: qty 1

## 2013-01-13 NOTE — ED Provider Notes (Signed)
CSN: [id number]     Arrival date & time 01/13/13  1023 History   First MD Initiated Contact with Patient 01/13/13 1053     Chief Complaint  Patient presents with  . Sickle Cell Pain Crisis  . Flank Pain  . Chest Pain   (Consider location/radiation/quality/duration/timing/severity/associated sxs/prior Treatment) HPI ZAMIER EGGEBRECHT is a 32 y.o. male who presents to emergency department complaining of sickle cell crisis type pain. Patient states that he has pain in bilateral ribs, back, thighs. States this feels like his prior sickle cell crisis pain. States pain began yesterday. Took his regular medications for pain at home with very mild relief. States unable to control the pain at home. Patient admits to mild cough. He denies any fever or chills. He denies any nausea or vomiting. No changes in bowels. No urinary symptoms. Nothing is making his symptoms worse.  Past Medical History  Diagnosis Date  . Sickle cell anemia   . Blood transfusion   . 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 Hypokalemia   . Mood disorder   . Pulmonary embolism   . Avascular necrosis   . Leukocytosis     Chronic  . Thrombocytosis     Chronic  . Hypertension   . C. difficile colitis    Past Surgical History  Procedure Laterality Date  . Right hip replacement      08/2006  . Cholecystectomy      01/2008  . Porta cath placement    . Porta cath removal    . Umbilical hernia repair      01/2008  . Excision of left periauricular cyst      10/2009  . Excision of right ear lobe cyst with primary closur      11/2007  . Portacath placement  01/05/2012    Procedure: INSERTION PORT-A-CATH;  Surgeon: Adolph Pollack, MD;  Location: [facility] OR;  Service: General;  Laterality: N/A;  ultrasound guiced port a cath insertion with fluoroscopy   Family History  Problem Relation Age of Onset  . Sickle cell anemia Mother   . Sickle cell anemia Father   . Sickle cell trait Brother    History   Substance Use Topics  . Smoking status: Former Smoker -- 13 years    Types: Cigarettes    Quit date: 07/08/2010  . Smokeless tobacco: Never Used  . Alcohol Use: No    Review of Systems  Constitutional: Negative for fever and chills.  Respiratory: Positive for cough. Negative for chest tightness and shortness of breath.   Cardiovascular: Positive for chest pain. Negative for palpitations and leg swelling.  Gastrointestinal: Negative for nausea, vomiting, abdominal pain, diarrhea and abdominal distention.  Genitourinary: Negative for dysuria, urgency, frequency and hematuria.  Musculoskeletal: Positive for arthralgias and myalgias. Negative for neck pain and neck stiffness.  Skin: Negative for rash.  Allergic/Immunologic: Negative for immunocompromised state.  Neurological: Negative for dizziness, weakness, light-headedness, numbness and headaches.  All other systems reviewed and are negative.    Allergies  Morphine and related  Home Medications   Current Outpatient Rx  Name  Route  Sig  Dispense  Refill  . celecoxib (CELEBREX) 200 MG capsule   Oral   Take 1 capsule (200 mg total) by mouth 2 (two) times daily.   60 capsule   1   . folic acid (FOLVITE) 1 MG tablet   Oral   Take 1 tablet (1 mg total) by mouth every morning.  30 tablet   11   . HYDROmorphone (DILAUDID) 4 MG tablet      Take 4 mg of dilaudid every 4 hours ATC for the next 24 hours then as needed for pain.   30 tablet   0   . hydroxyurea (HYDREA) 500 MG capsule   Oral   Take 2 capsules (1,000 mg total) by mouth daily. May take with food to minimize GI side effects.   60 capsule   1   . morphine (MS CONTIN) 15 MG 12 hr tablet   Oral   Take 1 tablet (15 mg total) by mouth 2 (two) times daily.   60 tablet   0   . potassium chloride SA (K-DUR,KLOR-CON) 20 MEQ tablet   Oral   Take 1 tablet (20 mEq total) by mouth every morning.   30 tablet   3   . Rivaroxaban (XARELTO) 20 MG TABS tablet    Oral   Take 1 tablet (20 mg total) by mouth every morning.   30 tablet   2   . zolpidem (AMBIEN) 10 MG tablet   Oral   Take 1 tablet (10 mg total) by mouth at bedtime as needed for sleep.   30 tablet   2    BP 121/77  Pulse 97  Temp(Src) 98.7 F (37.1 C) (Oral)  Resp 17  SpO2 100% Physical Exam  Nursing note and vitals reviewed. Constitutional: He appears well-developed and well-nourished. No distress.  HENT:  Head: Normocephalic and atraumatic.  Eyes: Conjunctivae are normal.  Neck: Neck supple.  Cardiovascular: Normal rate, regular rhythm and normal heart sounds.   Pulmonary/Chest: Effort normal. No respiratory distress. He has no wheezes. He has no rales. He exhibits no tenderness.  Abdominal: Soft. Bowel sounds are normal. He exhibits no distension. There is no tenderness. There is no rebound.  Musculoskeletal: He exhibits no edema.  Neurological: He is alert.  Skin: Skin is warm and dry.    ED Course  Procedures (including critical care time) Labs Review Labs Reviewed  CBC WITH DIFFERENTIAL - Abnormal; Notable for the following:    WBC 18.7 (*)    RBC 2.55 (*)    Hemoglobin 7.9 (*)    HCT 24.0 (*)    RDW 19.2 (*)    Platelets 565 (*)    Neutro Abs 13.8 (*)    Monocytes Absolute 2.1 (*)    Basophils Absolute 0.2 (*)    All other components within normal limits  COMPREHENSIVE METABOLIC PANEL - Abnormal; Notable for the following:    Glucose, Bld 106 (*)    AST 38 (*)    Total Bilirubin 3.9 (*)    All other components within normal limits  RETICULOCYTES - Abnormal; Notable for the following:    Retic Ct Pct 5.7 (*)    RBC. 2.55 (*)    All other components within normal limits   Imaging Review Dg Chest 2 View  01/13/2013   CLINICAL DATA:  Chest pain.  Sickle cell crisis.  EXAM: CHEST  2 VIEW  COMPARISON:  01/03/2013, 11/13/2012, 01/14/2012.  FINDINGS: Cardiac silhouette mildly enlarged but stable. Hilar and mediastinal contours otherwise unremarkable.  Prominent bronchovascular markings diffusely and mild central peribronchial thickening, unchanged. Linear scarring in the lower lobes, unchanged. Answer lungs otherwise clear left subclavian Port-A-Cath tip in the lower SVC at the cavoatrial junction.  IMPRESSION: No acute cardiopulmonary disease. Stable mild changes of chronic bronchitis and/or asthma and stable scarring in the lower lobes. Stable mild  cardiomegaly.   Electronically Signed   By: Hulan Saas M.D.   On: 01/13/2013 12:08    EKG Interpretation   None       MDM   1. Sickle cell crisis     Pt with sickle cell disease. VS normal. No distress. Will get labs, fluids, pain medications.    4:12 PM Labs at baseline. She received 4 doses of 2 mg of Dilaudid versus over 45 minutes apart. He's not improved. Will admit for pain management and sickle cell disease treatment.  Per Dr. Laural Benes was covering a sickle cell disease clinic will admit.  Filed Vitals:   01/13/13 1043 01/13/13 1449 01/13/13 1543  BP: 121/77 119/68 105/55  Pulse: 97 84 84  Temp: 98.7 F (37.1 C) 98.8 F (37.1 C) 98.9 F (37.2 C)  TempSrc: Oral Oral Oral  Resp: 17 20 20   SpO2: 100% 96% 96%        Lottie Mussel, PA-C 01/13/13 1612

## 2013-01-13 NOTE — H&P (Signed)
SICKLE CELL SERVICE History and Physical Examination   [name] WJX:[id number] DOB: 1980-12-04 DOA: 01/13/2013  PCP: [name],[name], MD   Chief Complaint: chest pain, back and rib pain  HPI: [name] is a 32 y.o. 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 Patient reports pain in bilateral ribs, back, thighs.  He has chronic daily pain that is usually 3-4/10 but reports that his pain is now 7-8/10.  States this feels like his prior sickle cell crisis pain.  Pt reports that pain began 1 day PTA.  He took his regular medications for pain at home with minimal relief and unable to control pain at home.  He denies any fever or chills. He denies any vomiting but has mild nausea.  He denies SOB.  No changes in bowels. No urinary symptoms. Nothing is making his symptoms worse.  He does report that he believes that the cold weather is affecting his disease and causing more crisis events.  He says that he is taking his home meds as prescribed and not missing doses.  He even reports that for the last month he has taken his hydroxyurea twice daily without missing doses in order to try and prevent the frequent recurrence of crisis events.  He admits that prior to 1 month ago he had not been taking it regularly.  He says that he has had mild abdominal pain for the last 2 weeks that he atributed to his taking his hydroxyurea more regularly.  He reports no tobacco use and no recent marijuana use ( in past 2 months ).   Hospitalization was requested for further evaluation and management.   Past Medical History Past Medical History  Diagnosis Date  . Sickle cell anemia   . Blood transfusion   . 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 Hypokalemia   . Mood disorder   . Pulmonary embolism   . Avascular necrosis   . Leukocytosis    Chronic  . Thrombocytosis     Chronic  . Hypertension   . C. difficile colitis   . Uses marijuana   . Chronic anticoagulation   . Functional asplenia     Past Surgical History Past Surgical History  Procedure Laterality Date  . Right hip replacement      08/2006  . Cholecystectomy      01/2008  . Porta cath placement    . Porta cath removal    . Umbilical hernia repair      01/2008  . Excision of left periauricular cyst      10/2009  . Excision of right ear lobe cyst with primary closur      11/2007  . Portacath placement  01/05/2012    Procedure: INSERTION PORT-A-CATH;  Surgeon: Adolph Pollack, MD;  Location: [facility] OR;  Service: General;  Laterality: N/A;  ultrasound guiced port a cath insertion with fluoroscopy    Home Meds: Prior to Admission medications   Medication Sig Start Date End Date Taking? Authorizing Provider  celecoxib (CELEBREX) 200 MG capsule Take 1 capsule (200 mg total) by mouth 2 (two) times daily. 11/19/12  Yes Altha Harm, MD  folic acid (FOLVITE) 1 MG tablet Take 1 tablet (1 mg total) by mouth every morning. 06/05/12  Yes Altha Harm, MD  HYDROmorphone (DILAUDID) 4 MG tablet Take 4 mg of dilaudid  every 4 hours ATC for the next 24 hours then as needed for pain. 01/03/13  Yes Altha Harm, MD  hydroxyurea (HYDREA) 500 MG capsule Take 2 capsules (1,000 mg total) by mouth daily. May take with food to minimize GI side effects. 11/19/12  Yes Altha Harm, MD  morphine (MS CONTIN) 15 MG 12 hr tablet Take 1 tablet (15 mg total) by mouth 2 (two) times daily. 12/24/12  Yes Rometta Emery, MD  potassium chloride SA (K-DUR,KLOR-CON) 20 MEQ tablet Take 1 tablet (20 mEq total) by mouth every morning. 07/31/12  Yes Altha Harm, MD  Rivaroxaban (XARELTO) 20 MG TABS tablet Take 1 tablet (20 mg total) by mouth every morning. 11/21/12  Yes Altha Harm, MD  zolpidem (AMBIEN) 10 MG tablet Take 1 tablet (10 mg total) by mouth at bedtime  as needed for sleep. 11/28/12  Yes Grayce Sessions, NP   Allergies: Morphine and related  Social History:  History   Social History  . Marital Status: Single    Spouse Name: N/A    Number of Children: 0  . Years of Education: 13   Occupational History  . 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 Smoking status: Former Smoker -- 13 years    Types: Cigarettes    Quit date: 07/08/2010  . Smokeless tobacco: Never Used  . Alcohol Use: No  . Drug Use: Yes    Special: Marijuana     Comment: quit smoking 2011  . Sexual Activity: Yes    Partners: Female    Pharmacist, hospital Protection: None   Other Topics Concern  . Not on file   Social History Narrative   Lives in an apartment.  Single.  Lives alone but has a girlfriend that helps care for him.  Does not use any assist devices.        [name]:  [phone] Mom, emergency contact   Family History:  Family History  Problem Relation Age of Onset  . Sickle cell anemia Mother   . Sickle cell anemia Father   . Sickle cell trait Brother     Review of Systems: The patient denies anorexia, fever, weight loss, vision loss, decreased hearing, hoarseness,  dyspnea on exertion, peripheral edema, balance deficits, hemoptysis, melena, hematochezia, severe indigestion/heartburn, hematuria, incontinence, genital sores, muscle weakness, suspicious skin lesions, transient blindness, difficulty walking, unusual weight change, abnormal bleeding, enlarged lymph nodes, angioedema, and masses.   All other systems reviewed and reported as negative.   Physical Exam: Blood pressure 105/55, pulse 84, temperature 98.9 F (37.2 C), temperature source Oral, resp. rate 20, SpO2 96.00%. General appearance: alert, cooperative, appears stated age, fatigued, icteric and mild distress Head: Normocephalic, without obvious abnormality, atraumatic Eyes: positive findings: sclera bilateral scleral  icterus seen Nose: Nares normal. Septum midline. Mucosa normal. No drainage or sinus tenderness. Throat: dry mucous membranes Neck: no adenopathy, no carotid bruit, no JVD, supple, symmetrical, trachea midline and thyroid not enlarged, symmetric, no tenderness/mass/nodules Back: tenderness in lumbar spine and bilateral flank with palpation Lungs: clear to auscultation bilaterally and normal percussion bilaterally Chest wall: bilateral lower rib tenderness to palpation Heart: regular rate and rhythm and S1, S2 normal Abdomen: soft, non-tender; bowel sounds normal; no masses,  no organomegaly Extremities: extremities normal, atraumatic, no cyanosis or edema Pulses: 2+ and symmetric Skin: Skin color, texture, turgor normal. No rashes or lesions Lymph nodes: Cervical, supraclavicular,  and axillary nodes normal. Neurologic: Alert and oriented X 3, normal strength and tone. Normal symmetric reflexes. Normal coordination and gait  Lab  And Imaging results:  Results for orders placed during the hospital encounter of 01/13/13 (from the past 24 hour(s))  CBC WITH DIFFERENTIAL     Status: Abnormal   Collection Time    01/13/13 11:40 AM      Result Value Range   WBC 18.7 (*) 4.0 - 10.5 K/uL   RBC 2.55 (*) 4.22 - 5.81 MIL/uL   Hemoglobin 7.9 (*) 13.0 - 17.0 g/dL   HCT 16.1 (*) 09.6 - 04.5 %   MCV 94.1  78.0 - 100.0 fL   MCH 31.0  26.0 - 34.0 pg   MCHC 32.9  30.0 - 36.0 g/dL   RDW 40.9 (*) 81.1 - 91.4 %   Platelets 565 (*) 150 - 400 K/uL   Neutrophils Relative % 74  43 - 77 %   Lymphocytes Relative 13  12 - 46 %   Monocytes Relative 11  3 - 12 %   Eosinophils Relative 1  0 - 5 %   Basophils Relative 1  0 - 1 %   Neutro Abs 13.8 (*) 1.7 - 7.7 K/uL   Lymphs Abs 2.4  0.7 - 4.0 K/uL   Monocytes Absolute 2.1 (*) 0.1 - 1.0 K/uL   Eosinophils Absolute 0.2  0.0 - 0.7 K/uL   Basophils Absolute 0.2 (*) 0.0 - 0.1 K/uL   RBC Morphology POLYCHROMASIA PRESENT    COMPREHENSIVE METABOLIC PANEL     Status:  Abnormal   Collection Time    01/13/13 11:40 AM      Result Value Range   Sodium 135  135 - 145 mEq/L   Potassium 4.1  3.5 - 5.1 mEq/L   Chloride 101  96 - 112 mEq/L   CO2 21  19 - 32 mEq/L   Glucose, Bld 106 (*) 70 - 99 mg/dL   BUN 16  6 - 23 mg/dL   Creatinine, Ser 7.82  0.50 - 1.35 mg/dL   Calcium 8.9  8.4 - 95.6 mg/dL   Total Protein 7.7  6.0 - 8.3 g/dL   Albumin 4.1  3.5 - 5.2 g/dL   AST 38 (*) 0 - 37 U/L   ALT 5  0 - 53 U/L   Alkaline Phosphatase 98  39 - 117 U/L   Total Bilirubin 3.9 (*) 0.3 - 1.2 mg/dL   GFR calc non Af Amer >90  >90 mL/min   GFR calc Af Amer >90  >90 mL/min  RETICULOCYTES     Status: Abnormal   Collection Time    01/13/13 11:40 AM      Result Value Range   Retic Ct Pct 5.7 (*) 0.4 - 3.1 %   RBC. 2.55 (*) 4.22 - 5.81 MIL/uL   Retic Count, Manual 145.4  19.0 - 186.0 K/uL    Impression / Plan   Sickle cell crisis / Sickle cell anemia with pain - Admit to medical bed for close monitoring - Initiate inpatient sickle cell treatment protocol - From recent previous hospitalizations, pt has required individualized PCA dilaudid dosing, will initiate therapy - Toradol IV to treat inflammatory component of this process - IVF hydration started, monitor electrolytes - check troponin - CXR was reviewed and not consistent with acute chest syndrome - Follow CBC and likely will transfuse for Hg less than 7 - Provide heating pad for back  - Incentive spirometry  -  resume home MS contin  Mood disorder / Chronic Insomnia - Pt is reporting that his symptoms have been stable - continue zolpidem as needed for insomnia  Hx of pulmonary embolus - resume chronic anticoagulation with oral Xarelto     Leukocytosis, unspecified - likely this is secondary to the acute inflammatory process - following WBC, check UA    Uses marijuana - check rapid urine toxicology screen - pt reported that he has not used this in past 2 months  Thrombocytosis - chronic secondary  to increased bone marrow activity  Secondary hemochromatosis   Standley Dakins MD [facility] [place], [place] [phone] 01/13/2013, 5:10 PM

## 2013-01-13 NOTE — ED Notes (Signed)
Pt c/o intermittent chest pain and bilat side pain times 3 days. Pt has sickle cell.  Pt denies any radiation with chest pain or shob, dizziness, weakness, n/v.

## 2013-01-13 NOTE — ED Notes (Signed)
Admitting MD at bedside.

## 2013-01-14 DIAGNOSIS — D5700 Hb-SS disease with crisis, unspecified: Secondary | ICD-10-CM | POA: Diagnosis not present

## 2013-01-14 DIAGNOSIS — Z7901 Long term (current) use of anticoagulants: Secondary | ICD-10-CM | POA: Diagnosis not present

## 2013-01-14 DIAGNOSIS — R0789 Other chest pain: Secondary | ICD-10-CM | POA: Diagnosis not present

## 2013-01-14 DIAGNOSIS — R079 Chest pain, unspecified: Secondary | ICD-10-CM

## 2013-01-14 DIAGNOSIS — E860 Dehydration: Secondary | ICD-10-CM | POA: Diagnosis not present

## 2013-01-14 DIAGNOSIS — Z86711 Personal history of pulmonary embolism: Secondary | ICD-10-CM

## 2013-01-14 LAB — HIV ANTIBODY (ROUTINE TESTING W REFLEX): HIV: NONREACTIVE

## 2013-01-14 LAB — COMPREHENSIVE METABOLIC PANEL
ALT: 16 U/L (ref 0–53)
AST: 27 U/L (ref 0–37)
Alkaline Phosphatase: 81 U/L (ref 39–117)
CO2: 23 mEq/L (ref 19–32)
Chloride: 105 mEq/L (ref 96–112)
Creatinine, Ser: 0.65 mg/dL (ref 0.50–1.35)
GFR calc non Af Amer: >90 mL/min (ref >90)
Glucose, Bld: 112 mg/dL — ABNORMAL HIGH (ref 70–99)
Potassium: 3.7 mEq/L (ref 3.5–5.1)
Sodium: 137 mEq/L (ref 135–145)
Total Bilirubin: 4.4 mg/dL — ABNORMAL HIGH (ref 0.3–1.2)

## 2013-01-14 LAB — CBC WITH DIFFERENTIAL/PLATELET
Basophils Absolute: 0.2 10*3/uL — ABNORMAL HIGH (ref 0.0–0.1)
Basophils Relative: 1 % (ref 0–1)
HCT: 21.7 % — ABNORMAL LOW (ref 39.0–52.0)
Lymphocytes Relative: 22 % (ref 12–46)
Lymphs Abs: 4.2 10*3/uL — ABNORMAL HIGH (ref 0.7–4.0)
MCH: 31.5 pg (ref 26.0–34.0)
MCV: 93.5 fL (ref 78.0–100.0)
Monocytes Absolute: 2.6 10*3/uL — ABNORMAL HIGH (ref 0.1–1.0)
Monocytes Relative: 14 % — ABNORMAL HIGH (ref 3–12)
Neutro Abs: 11.5 10*3/uL — ABNORMAL HIGH (ref 1.7–7.7)
Platelets: 542 10*3/uL — ABNORMAL HIGH (ref 150–400)
RDW: 19.2 % — ABNORMAL HIGH (ref 11.5–15.5)

## 2013-01-14 LAB — VITAMIN D 25 HYDROXY (VIT D DEFICIENCY, FRACTURES): Vit D, 25-Hydroxy: <10 ng/mL — ABNORMAL LOW (ref 30–89)

## 2013-01-14 MED ORDER — VITAMIN D (ERGOCALCIFEROL) 1.25 MG (50000 UNIT) PO CAPS
50000.0000 [IU] | ORAL_CAPSULE | ORAL | Status: DC
  Administered 2013-01-14 – 2013-01-21 (×3): 50000 [IU] via ORAL
  Filled 2013-01-14 (×4): qty 1

## 2013-01-14 MED ORDER — HYDROMORPHONE 0.3 MG/ML IV SOLN
INTRAVENOUS | Status: DC
  Administered 2013-01-14 (×4): via INTRAVENOUS
  Administered 2013-01-14: 5.52 mg via INTRAVENOUS
  Administered 2013-01-14: 12.92 mg via INTRAVENOUS
  Administered 2013-01-14: 10.8 mg via INTRAVENOUS
  Administered 2013-01-15: 07:00:00 via INTRAVENOUS
  Administered 2013-01-15: 9.64 mg via INTRAVENOUS
  Administered 2013-01-15: 10:00:00 via INTRAVENOUS
  Administered 2013-01-15 (×2): 9.64 mg via INTRAVENOUS
  Administered 2013-01-15: 02:00:00 via INTRAVENOUS
  Administered 2013-01-15: 7.6 mg via INTRAVENOUS
  Filled 2013-01-14 (×8): qty 25

## 2013-01-14 NOTE — Progress Notes (Addendum)
SICKLE CELL SERVICE PROGRESS NOTE  [name] [name] DOB: 1980/06/21 DOA: 01/13/2013 PCP: [name],[name], MD  Assessment/Plan: Sickle cell crisis / Sickle cell anemia with pain  - Admitted to medical bed for close monitoring  - continue inpatient sickle cell treatment protocol  - From recent previous hospitalizations, pt has required individualized PCA dilaudid dosing, pt suboptimally controlled at this time, therefore will increase dilaudid PCA dose to 1.0, interval 10 mins, 4 hr limit 22 mg. Will need to continue close monitoring and titrate as needed for better pain control.  Continue MS contin.  - Toradol IV to treat inflammatory component of this process  - IVF hydration started, monitor electrolytes  - CXR was reviewed and not consistent with acute chest syndrome  - Follow CBC and likely will transfuse for Hg less than 7  - Provide heating pad for back  - Incentive spirometry   Mood disorder / Chronic Insomnia  - Pt is reporting that his symptoms have been stable  - continue zolpidem as needed for insomnia   Hx of pulmonary embolus  - resume chronic anticoagulation with oral Xarelto   Leukocytosis, unspecified  - likely this is secondary to the acute inflammatory process  - following WBC, UA neg for infection  Uses marijuana  - rapid urine toxicology screen positive  - pt reported that he has not used this in past 2 months  - pt counseled  Thrombocytosis  - chronic secondary to increased bone marrow activity   Secondary hemochromatosis - from multiple previous transfusions  Low TSH - Recheck/reassess after discharge with PCP   Severe Vit D Deficiency - Will start oral Drisdol 50,000 IU caps twice per week  Code Status: Full  Family Communication: None at bedside Disposition Plan: not ready for discharge   HPI/Subjective: Pt was talking on the phone and eating breakfast when I walked in the room.  He did not appear to be in any distress at  all.  Pt reported that his back pain became acutely worse overnight and he has tossed and turned in bed,  His pain is 7-8/10 at this time.  He is on the dilaudid PCA but reports that his pain is not controlled.  Pt has had 31.66 mg dilaudid 8 demands and 8 deliveries reported by RN.   Objective: Filed Vitals:   01/14/13 0816  BP:   Pulse:   Temp:   Resp: 20    Intake/Output Summary (Last 24 hours) at 01/14/13 8119 Last data filed at 01/14/13 0720  Gross per 24 hour  Intake 4197.8 ml  Output   1300 ml  Net 2897.8 ml   Filed Weights   01/13/13 1831 01/14/13 0606  Weight: 175 lb 7.8 oz (79.6 kg) 179 lb 0.2 oz (81.2 kg)    Exam:  General appearance: alert, cooperative, appears stated age, fatigued, icteric and mild distress  Head: Normocephalic, without obvious abnormality, atraumatic  Eyes: positive findings: bilateral scleral icterus  Nose: Nares normal. Septum midline. Mucosa dry. No drainage or sinus tenderness.  Throat: dry mucous membranes  Neck: no adenopathy, no carotid bruit, no JVD, supple, symmetrical, trachea midline and thyroid not enlarged, symmetric, no tenderness/mass/nodules  Back: tenderness in lumbar spine and bilateral flank pain with palpation  Lungs: clear to auscultation bilaterally and normal percussion bilaterally  Chest wall: bilateral lower rib tenderness to palpation  Heart: regular rate and rhythm and S1, S2 normal  Abdomen: soft, non-tender; bowel sounds normal; no masses, no organomegaly  Extremities: extremities normal,  atraumatic, no cyanosis or edema  Neurologic: Alert and oriented X 3, normal strength and tone. Normal symmetric reflexes. Normal coordination and gait   Data Reviewed: Basic Metabolic Panel:  Recent Labs Lab 01/13/13 1140 01/13/13 1820 01/14/13 0540  NA 135  --  137  K 4.1  --  3.7  CL 101  --  105  CO2 21  --  23  GLUCOSE 106*  --  112*  BUN 16  --  11  CREATININE 0.74  --  0.65  CALCIUM 8.9  --  8.6  MG  --  1.9  --     Liver Function Tests:  Recent Labs Lab 01/13/13 1140 01/14/13 0540  AST 38* 27  ALT 5 16  ALKPHOS 98 81  BILITOT 3.9* 4.4*  PROT 7.7 7.3  ALBUMIN 4.1 3.9   No results found for this basename: LIPASE, AMYLASE,  in the last 168 hours No results found for this basename: AMMONIA,  in the last 168 hours CBC:  Recent Labs Lab 01/10/13 0920 01/13/13 1140 01/14/13 0540  WBC 13.1* 18.7* 18.9*  NEUTROABS 9.4* 13.8* 11.5*  HGB 8.7* 7.9* 7.3*  HCT 25.4* 24.0* 21.7*  MCV 93.0 94.1 93.5  PLT 588* 565* 542*   Cardiac Enzymes:  Recent Labs Lab 01/13/13 1820  TROPONINI <0.30   BNP (last 3 results)  Recent Labs  11/13/12 1826  PROBNP 106.3   CBG: No results found for this basename: GLUCAP,  in the last 168 hours  No results found for this or any previous visit (from the past 240 hour(s)).   Studies: Dg Chest 2 View  01/13/2013   CLINICAL DATA:  Chest pain.  Sickle cell crisis.  EXAM: CHEST  2 VIEW  COMPARISON:  01/03/2013, 11/13/2012, 01/14/2012.  FINDINGS: Cardiac silhouette mildly enlarged but stable. Hilar and mediastinal contours otherwise unremarkable. Prominent bronchovascular markings diffusely and mild central peribronchial thickening, unchanged. Linear scarring in the lower lobes, unchanged. Answer lungs otherwise clear left subclavian Port-A-Cath tip in the lower SVC at the cavoatrial junction.  IMPRESSION: No acute cardiopulmonary disease. Stable mild changes of chronic bronchitis and/or asthma and stable scarring in the lower lobes. Stable mild cardiomegaly.   Electronically Signed   By: Hulan Saas M.D.   On: 01/13/2013 12:08    Scheduled Meds: . folic acid  1 mg Oral q morning - 10a  . HYDROmorphone PCA 0.3 mg/mL   Intravenous Q4H  . hydroxyurea  500 mg Oral BID WC  . ketorolac  30 mg Intravenous Q6H  . morphine  15 mg Oral BID  . pantoprazole  40 mg Oral Daily   Or  . pantoprazole (PROTONIX) IV  40 mg Intravenous Daily  . potassium chloride SA  20  mEq Oral q morning - 10a  . Rivaroxaban  20 mg Oral q morning - 10a  . senna-docusate  1 tablet Oral BID   Continuous Infusions: . dextrose 5 % and 0.45% NaCl 125 mL/hr at 01/14/13 0234    Principal Problem:   Sickle cell crisis Active Problems:   Avascular necrosis   Secondary hemochromatosis   Mood disorder   Hypertension   Sickle cell hemolytic anemia   Hx of pulmonary embolus   Leukocytosis, unspecified   Sickle cell anemia with pain   Uses marijuana   Atypical chest pain   Thrombocytosis   Low back pain   Rib pain   Dehydration   [name]  [facility] Pager [phone]. If 7PM-7AM, please contact night-coverage  at [email or web address], password [facility] 01/14/2013, 8:24 AM  LOS: 1 day

## 2013-01-14 NOTE — Progress Notes (Signed)
Received call from nurse telling me that patient complaining about pain not being controlled 8/10.  RN reports that patient had not been using PCA to maximal effect.  He had used less than half of available medication.  I asked RN to review use of PCA with patient and encourage him to use it.  He had been sleeping comfortably and missed some of his available medication doses.  Pt had told RN that he had better pain relief when he was given 4mg  every 2 hours but he could actually receive more pain relieving medication if he used the PCA more more.  Pt also scheduled to receive his evening dose of his chronic long-acting pain meds at 10 pm.  Will continue to monitor closely for sedation, respiratory depression, etc.  Maryln Manuel, MD

## 2013-01-15 DIAGNOSIS — D72829 Elevated white blood cell count, unspecified: Secondary | ICD-10-CM | POA: Diagnosis not present

## 2013-01-15 DIAGNOSIS — R079 Chest pain, unspecified: Secondary | ICD-10-CM | POA: Diagnosis not present

## 2013-01-15 DIAGNOSIS — D5700 Hb-SS disease with crisis, unspecified: Secondary | ICD-10-CM | POA: Diagnosis not present

## 2013-01-15 DIAGNOSIS — D473 Essential (hemorrhagic) thrombocythemia: Secondary | ICD-10-CM | POA: Diagnosis not present

## 2013-01-15 LAB — COMPREHENSIVE METABOLIC PANEL
ALT: 14 U/L (ref 0–53)
AST: 24 U/L (ref 0–37)
Albumin: 3.8 g/dL (ref 3.5–5.2)
Alkaline Phosphatase: 84 U/L (ref 39–117)
BUN: 9 mg/dL (ref 6–23)
CO2: 22 mEq/L (ref 19–32)
Chloride: 103 mEq/L (ref 96–112)
Creatinine, Ser: 0.59 mg/dL (ref 0.50–1.35)
GFR calc Af Amer: >90 mL/min (ref >90)
GFR calc non Af Amer: >90 mL/min (ref >90)
Glucose, Bld: 113 mg/dL — ABNORMAL HIGH (ref 70–99)
Potassium: 4 mEq/L (ref 3.5–5.1)
Sodium: 135 mEq/L (ref 135–145)
Total Bilirubin: 4 mg/dL — ABNORMAL HIGH (ref 0.3–1.2)

## 2013-01-15 LAB — CBC
Hemoglobin: 7.4 g/dL — ABNORMAL LOW (ref 13.0–17.0)
MCH: 32 pg (ref 26.0–34.0)
Platelets: 542 10*3/uL — ABNORMAL HIGH (ref 150–400)
RBC: 2.31 MIL/uL — ABNORMAL LOW (ref 4.22–5.81)
WBC: 21.3 10*3/uL — ABNORMAL HIGH (ref 4.0–10.5)

## 2013-01-15 IMAGING — CR DG CHEST 2V
2 series · 2 of 2 positions shown · non-contrast
Comparison: 02/06/2010 and earlier

CLINICAL DATA: Chest pain

CHEST - 2 VIEW

[w chest pa]
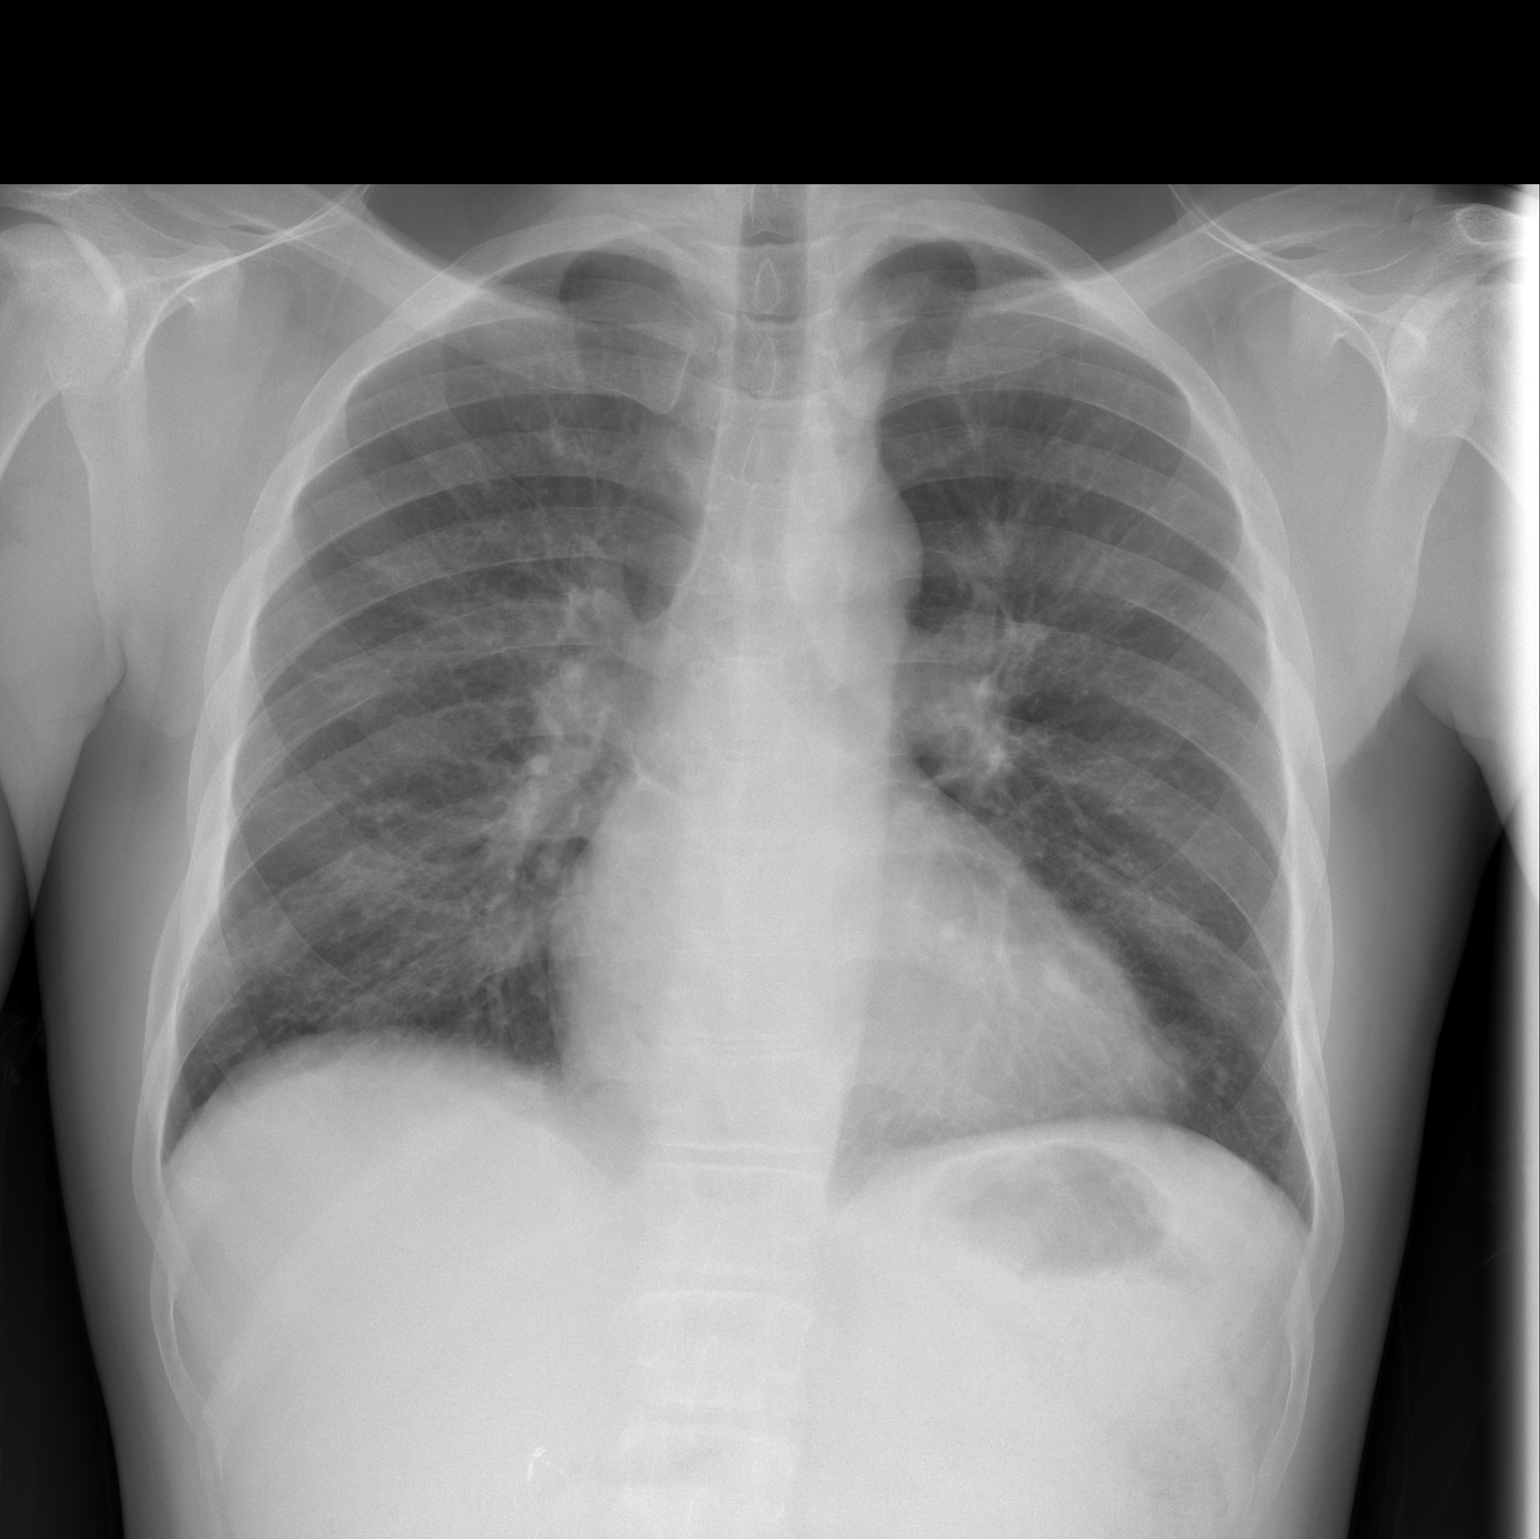

[w chest lat]
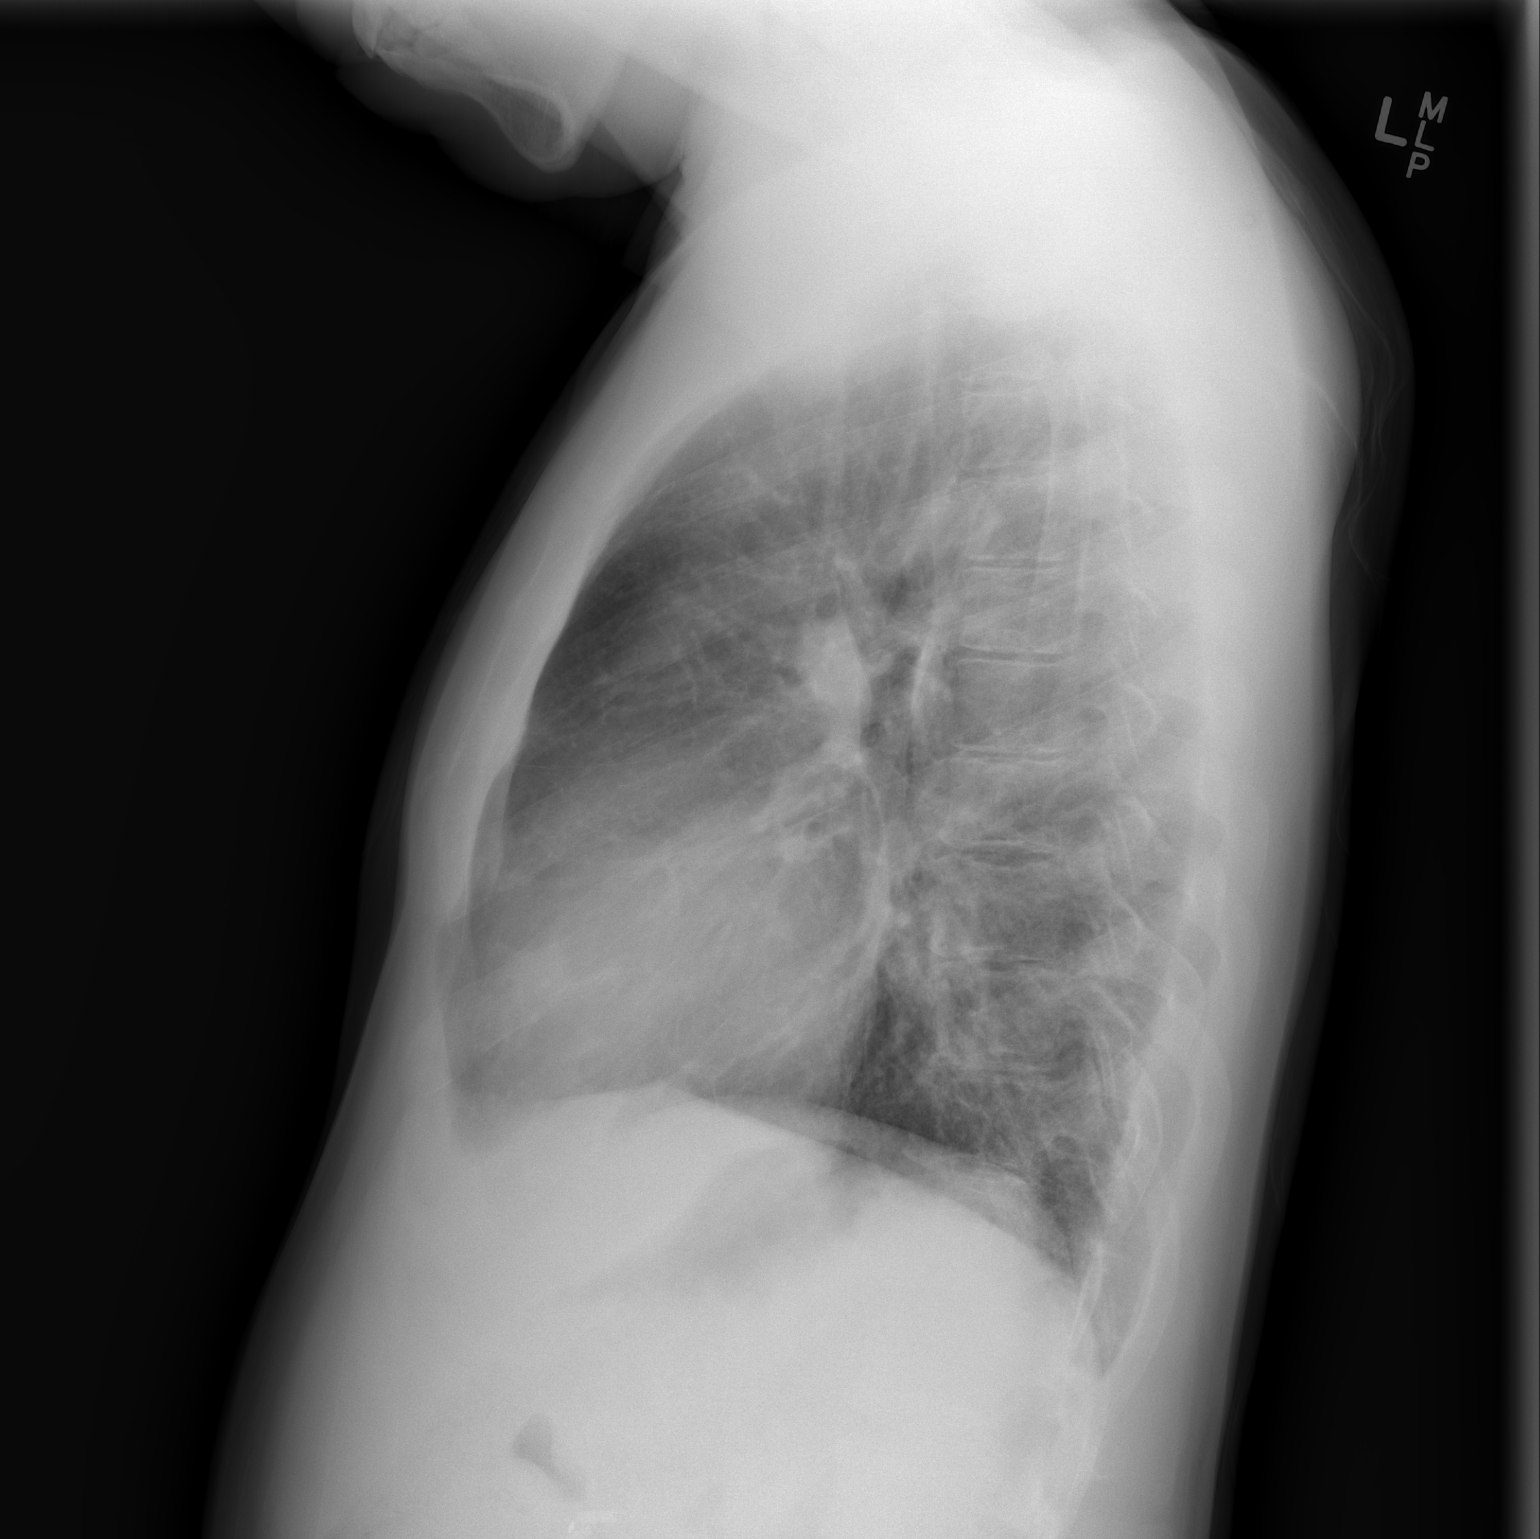

[2 of 2 positions shown; findings below may reference images not displayed]

FINDINGS: Heart size upper normal.  Currently no congestive heart
failure or active disease.  No pleural fluid or osseous lesions.
There are some early osseous manifestations of sickle cell disease
involving the lower thoracic spine.
IMPRESSION: Currently no active cardiopulmonary disease.

## 2013-01-15 MED ORDER — HYDROMORPHONE HCL PF 2 MG/ML IJ SOLN
2.0000 mg | Freq: Once | INTRAMUSCULAR | Status: AC
  Administered 2013-01-15: 2 mg via INTRAVENOUS
  Filled 2013-01-15: qty 1

## 2013-01-15 MED ORDER — HYDROMORPHONE 0.3 MG/ML IV SOLN
INTRAVENOUS | Status: DC
  Administered 2013-01-15: 9 mg via INTRAVENOUS
  Administered 2013-01-15 (×3): via INTRAVENOUS
  Administered 2013-01-16: 2.22 mg via INTRAVENOUS
  Administered 2013-01-16 (×2): via INTRAVENOUS
  Administered 2013-01-16: 5.6 mg via INTRAVENOUS
  Administered 2013-01-16: 4.16 mg via INTRAVENOUS
  Administered 2013-01-16: 2.21 mg via INTRAVENOUS
  Administered 2013-01-16: 16:00:00 via INTRAVENOUS
  Administered 2013-01-16: 12.6 mg via INTRAVENOUS
  Administered 2013-01-16: 10.5 mg via INTRAVENOUS
  Administered 2013-01-17: 8.87 mg via INTRAVENOUS
  Administered 2013-01-17 (×3): via INTRAVENOUS
  Administered 2013-01-17: 1.4 mg via INTRAVENOUS
  Administered 2013-01-17: 20:00:00 via INTRAVENOUS
  Administered 2013-01-17: 14.54 mg via INTRAVENOUS
  Administered 2013-01-17: 11.64 mg via INTRAVENOUS
  Administered 2013-01-17: 09:00:00 via INTRAVENOUS
  Administered 2013-01-17: 7.61 mg via INTRAVENOUS
  Administered 2013-01-17: 4.69 mg via INTRAVENOUS
  Administered 2013-01-18: 4.8 mg via INTRAVENOUS
  Administered 2013-01-18: 2.8 mg via INTRAVENOUS
  Administered 2013-01-18: 5.59 mg via INTRAVENOUS
  Administered 2013-01-18: 04:00:00 via INTRAVENOUS
  Filled 2013-01-15 (×15): qty 25

## 2013-01-15 MED ORDER — HYDROMORPHONE HCL 4 MG PO TABS
4.0000 mg | ORAL_TABLET | ORAL | Status: DC
  Administered 2013-01-15 – 2013-01-21 (×30): 4 mg via ORAL
  Filled 2013-01-15 (×30): qty 1

## 2013-01-15 MED ORDER — GABAPENTIN 300 MG PO CAPS
300.0000 mg | ORAL_CAPSULE | Freq: Every day | ORAL | Status: DC
  Administered 2013-01-15 – 2013-01-20 (×6): 300 mg via ORAL
  Filled 2013-01-15 (×8): qty 1

## 2013-01-15 NOTE — Progress Notes (Signed)
SICKLE CELL SERVICE PROGRESS NOTE  [name] OZH:[id number] DOB: [birth year] DOA: 01/13/2013 PCP: Remmington Teters A., MD  Assessment/Plan: Principal Problem:   Sickle cell crisis Active Problems:   Hypertension   Avascular necrosis   Secondary hemochromatosis   Mood disorder   Sickle cell hemolytic anemia   Hx of pulmonary embolus   Leukocytosis, unspecified   Sickle cell anemia with pain   Uses marijuana   Atypical chest pain   Thrombocytosis   Low back pain   Rib pain   Dehydration  1. Hb SS with crisis: Pt states that pain is still 8/10 mostly localized to his ribs and sides. He has no component of pleurisy or hypoxemia.  He has used a total of 59 mg of Dilaudid with 61 attempts and 59 deliveries on the PCA. I will give a bolus of 2 mg now and then adjust his PCA to a lower dose while scheduling his short acting medication. I will re-evaluate PCA use in 4 hours and make adjustments as necessary based upon patient's use and condition at that time. Continue Toradol. Discontinue IVF. Reinforced guided imagery and mindful thought as adjuncts to managing pain. 2. Vitamin D deficiency: Pt has a vitamin D level of <10. He has been started on  Ergocalciferol 50,000 Units TIW. 3. Leukocytosis: Pt shows no s/s of an infection. I suspect that this is related to the acute crisis.Will continue to monitor. 4. H/O Pulmonary embolus: Continue Xarelto 20 mg daily.  Code Status: Full Code Family Communication: N/A Disposition Plan: Home in 24-48 hours  Kaelani Kendrick A.  Pager [phone]. If 7PM-7AM, please contact night-coverage.  01/15/2013, 5:59 PM  LOS: 2 days   Brief narrative: [name] is a 32 y.o. 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 Patient reports pain in bilateral ribs, back, thighs. He has chronic daily pain that is usually  3-4/10 but reports that his pain is now 7-8/10. States this feels like his prior sickle cell crisis pain. Pt reports that pain began 1 day PTA. He took his regular medications for pain at home with minimal relief and unable to control pain at home. He denies any fever or chills. He denies any vomiting but has mild nausea. He denies SOB. No changes in bowels. No urinary symptoms. Nothing is making his symptoms worse. He does report that he believes that the cold weather is affecting his disease and causing more crisis events. He says that he is taking his home meds as prescribed and not missing doses. He even reports that for the last month he has taken his hydroxyurea twice daily without missing doses in order to try and prevent the frequent recurrence of crisis events. He admits that prior to 1 month ago he had not been taking it regularly. He says that he has had mild abdominal pain for the last 2 weeks that he atributed to his taking his hydroxyurea more regularly. He reports no tobacco use and no recent marijuana use ( in past 2 months ). Hospitalization was requested for further evaluation and management   Consultants:  None  Procedures:  None  Antibiotics:  None  HPI/Subjective:  Pt states that pain is still 8/10 mostly localized to his ribs and sides. He has no component of pleurisy or hypoxemia. Pt reports a BM yesterday.  Objective: Filed Vitals:   01/15/13 1143 01/15/13 1249 01/15/13 1400 01/15/13  1527  BP:   129/83   Pulse:   92   Temp:   98.4 F (36.9 C)   TempSrc:   Oral   Resp: 20 20 21 24   Height:      Weight:      SpO2: 94% 94% 94%    Weight change: 6 lb 6.3 oz (2.9 kg)  Intake/Output Summary (Last 24 hours) at 01/15/13 1759 Last data filed at 01/15/13 1400  Gross per 24 hour  Intake 1491.25 ml  Output   2750 ml  Net -1258.75 ml    General: Alert, awake, oriented x3, in no acute distress.  HEENT: [place]/AT PEERL, EOMI, anicteric Heart: Regular rate and rhythm,  without murmurs, rubs, gallops.  Lungs: Clear to auscultation, no wheezing or rhonchi noted.   Abdomen: Soft, nontender, nondistended, positive bowel sounds, no masses no hepatosplenomegaly noted.  Neuro: No focal neurological deficits noted cranial nerves II through XII grossly intact. Strength normal in bilateral upper and lower extremities. Musculoskeletal: No warm swelling or erythema around joints, no spinal tenderness noted. Psychiatric: Patient alert and oriented x3, good insight and cognition, good recent to remote recall. Lymph node survey: No cervical axillary or inguinal lymphadenopathy noted.   Data Reviewed: Basic Metabolic Panel:  Recent Labs Lab 01/13/13 1140 01/13/13 1820 01/14/13 0540 01/15/13 0510  NA 135  --  137 135  K 4.1  --  3.7 4.0  CL 101  --  105 103  CO2 21  --  23 22  GLUCOSE 106*  --  112* 113*  BUN 16  --  11 9  CREATININE 0.74  --  0.65 0.59  CALCIUM 8.9  --  8.6 8.8  MG  --  1.9  --   --    Liver Function Tests:  Recent Labs Lab 01/13/13 1140 01/14/13 0540 01/15/13 0510  AST 38* 27 24  ALT 5 16 14   ALKPHOS 98 81 84  BILITOT 3.9* 4.4* 4.0*  PROT 7.7 7.3 7.3  ALBUMIN 4.1 3.9 3.8   No results found for this basename: LIPASE, AMYLASE,  in the last 168 hours No results found for this basename: AMMONIA,  in the last 168 hours CBC:  Recent Labs Lab 01/10/13 0920 01/13/13 1140 01/14/13 0540 01/15/13 0510  WBC 13.1* 18.7* 18.9* 21.3*  NEUTROABS 9.4* 13.8* 11.5*  --   HGB 8.7* 7.9* 7.3* 7.4*  HCT 25.4* 24.0* 21.7* 21.6*  MCV 93.0 94.1 93.5 93.5  PLT 588* 565* 542* 542*   Cardiac Enzymes:  Recent Labs Lab 01/13/13 1820  TROPONINI <0.30   BNP (last 3 results)  Recent Labs  11/13/12 1826  PROBNP 106.3   Studies: Dg Chest 2 View  01/13/2013   CLINICAL DATA:  Chest pain.  Sickle cell crisis.  EXAM: CHEST  2 VIEW  COMPARISON:  01/03/2013, 11/13/2012, 01/14/2012.  FINDINGS: Cardiac silhouette mildly enlarged but stable. Hilar  and mediastinal contours otherwise unremarkable. Prominent bronchovascular markings diffusely and mild central peribronchial thickening, unchanged. Linear scarring in the lower lobes, unchanged. Answer lungs otherwise clear left subclavian Port-A-Cath tip in the lower SVC at the cavoatrial junction.  IMPRESSION: No acute cardiopulmonary disease. Stable mild changes of chronic bronchitis and/or asthma and stable scarring in the lower lobes. Stable mild cardiomegaly.   Electronically Signed   By: Hulan Saas M.D.   On: 01/13/2013 12:08   Dg Chest 2 View  01/03/2013   CLINICAL DATA:  Sickle cell crisis.  EXAM: CHEST  2 VIEW  COMPARISON:  11/17/2012 CT  FINDINGS: Mild cardiac enlargement and central vascular prominence. Linear retrocardiac opacity. No pleural effusion or pneumothorax. Surgical clips right upper quadrant. Left chest wall Port-A-Cath with tip projecting over the mid SVC. No acute osseous finding.  IMPRESSION: Cardiomegaly and central vascular prominence, similar to prior.  Retrocardiac opacity has decreased compared to the prior and now appears linear, therefore favored to reflect atelectasis/ scarring.   Electronically Signed   By: Jearld Lesch M.D.   On: 01/03/2013 03:12    Scheduled Meds: . folic acid  1 mg Oral q morning - 10a  . gabapentin  300 mg Oral QHS  . HYDROmorphone PCA 0.3 mg/mL   Intravenous Q4H  . HYDROmorphone  4 mg Oral Q4H  . hydroxyurea  500 mg Oral BID WC  . ketorolac  30 mg Intravenous Q6H  . morphine  15 mg Oral BID  . pantoprazole  40 mg Oral Daily  . potassium chloride SA  20 mEq Oral q morning - 10a  . Rivaroxaban  20 mg Oral q morning - 10a  . senna-docusate  1 tablet Oral BID  . Vitamin D (Ergocalciferol)  50,000 Units Oral 2 times weekly   Continuous Infusions: . dextrose 5 % and 0.45% NaCl 20 mL/hr at 01/15/13 1140    Total time 38 minutes

## 2013-01-15 NOTE — Care Management Note (Signed)
   CARE MANAGEMENT NOTE 01/15/2013  Patient:  [name], [name]   Account Number:  [id number]  Date Initiated:  01/15/2013  Documentation initiated by:  Ijanae Macapagal  Subjective/Objective Assessment:   32 yo male admitted with sickle cell crisis.     Action/Plan:   Home when stable   Anticipated DC Date:     Anticipated DC Plan:  HOME/SELF CARE      DC Planning Services  CM consult      Choice offered to / List presented to:  NA   DME arranged  NA      DME agency  NA     HH arranged  NA      HH agency  NA   Status of service:  Completed, signed off Medicare Important Message given?   (If response is "NO", the following Medicare IM given date fields will be blank) Date Medicare IM given:   Date Additional Medicare IM given:    Discharge Disposition:    Per UR Regulation:  Reviewed for med. necessity/level of care/duration of stay  If discussed at Long Length of Stay Meetings, dates discussed:    Comments:  01/15/13 1344 Alexx Giambra,MSN,RN [phone] Chart reviewed.PCP: [name],[name], MD. Pt independent prior to admission. No needs identified at this current time.

## 2013-01-16 DIAGNOSIS — M545 Low back pain: Secondary | ICD-10-CM | POA: Diagnosis not present

## 2013-01-16 DIAGNOSIS — R0789 Other chest pain: Secondary | ICD-10-CM | POA: Diagnosis not present

## 2013-01-16 DIAGNOSIS — D5700 Hb-SS disease with crisis, unspecified: Secondary | ICD-10-CM | POA: Diagnosis not present

## 2013-01-16 DIAGNOSIS — I10 Essential (primary) hypertension: Secondary | ICD-10-CM | POA: Diagnosis not present

## 2013-01-16 LAB — CBC WITH DIFFERENTIAL/PLATELET
Basophils Absolute: 0.2 10*3/uL — ABNORMAL HIGH (ref 0.0–0.1)
Eosinophils Absolute: 1.2 10*3/uL — ABNORMAL HIGH (ref 0.0–0.7)
Eosinophils Relative: 6 % — ABNORMAL HIGH (ref 0–5)
HCT: 20.1 % — ABNORMAL LOW (ref 39.0–52.0)
Hemoglobin: 7 g/dL — ABNORMAL LOW (ref 13.0–17.0)
Lymphocytes Relative: 17 % (ref 12–46)
MCH: 32.3 pg (ref 26.0–34.0)
MCV: 92.6 fL (ref 78.0–100.0)
Monocytes Absolute: 2.5 10*3/uL — ABNORMAL HIGH (ref 0.1–1.0)
Monocytes Relative: 12 % (ref 3–12)
Platelets: 516 10*3/uL — ABNORMAL HIGH (ref 150–400)
RDW: 18.8 % — ABNORMAL HIGH (ref 11.5–15.5)
WBC: 19.9 10*3/uL — ABNORMAL HIGH (ref 4.0–10.5)

## 2013-01-16 LAB — COMPREHENSIVE METABOLIC PANEL
AST: 25 U/L (ref 0–37)
BUN: 11 mg/dL (ref 6–23)
CO2: 23 mEq/L (ref 19–32)
Calcium: 8.7 mg/dL (ref 8.4–10.5)
Creatinine, Ser: 0.58 mg/dL (ref 0.50–1.35)
GFR calc Af Amer: >90 mL/min (ref >90)
GFR calc non Af Amer: >90 mL/min (ref >90)
Glucose, Bld: 111 mg/dL — ABNORMAL HIGH (ref 70–99)
Sodium: 135 mEq/L (ref 135–145)
Total Protein: 6.8 g/dL (ref 6.0–8.3)

## 2013-01-16 MED ORDER — HYDROMORPHONE HCL PF 1 MG/ML IJ SOLN
1.0000 mg | INTRAMUSCULAR | Status: DC | PRN
  Administered 2013-01-16 – 2013-01-18 (×15): 1 mg via INTRAVENOUS
  Filled 2013-01-16 (×15): qty 1

## 2013-01-16 NOTE — Progress Notes (Signed)
Subjective: Patient still complaining of 8/10 pain in his ribs and back used   27.5mg  of Dilaudid via PCA in the last 24 hours. Has had one time dose of 2mg  IV dilaudid yesterday for breakthrough pain. Patient still not feeling good according to him. He has lately been using a lot of hospital resources as well as increasing pain tolerance. He denies any fever, no cough no shortness of breath.  Objective: Vital signs in last 24 hours: Temp:  [98.1 F (36.7 C)-98.8 F (37.1 C)] 98.4 F (36.9 C) (12/17 0457) Pulse Rate:  [69-96] 69 (12/17 0457) Resp:  [13-24] 15 (12/17 0457) BP: (120-145)/(82-97) 121/85 mmHg (12/17 0457) SpO2:  [94 %-96 %] 95 % (12/17 0457) FiO2 (%):  [87 %-96 %] 95 % (12/17 0400) Weight:  [80.9 kg (178 lb 5.6 oz)] 80.9 kg (178 lb 5.6 oz) (12/17 0457) Weight change: -1.6 kg (-3 lb 8.4 oz) Last BM Date: 01/15/13  Intake/Output from previous day: 12/16 0701 - 12/17 0700 In: 1193.3 [P.O.:480; I.V.:713.3] Out: 1825 [Urine:1825] Intake/Output this shift: Total I/O In: -  Out: 325 [Urine:325]  General appearance: alert, cooperative, appears stated age and no distress Eyes: conjunctivae/corneas clear. PERRL, EOM's intact. Fundi benign. Neck: no adenopathy, no carotid bruit, no JVD, supple, symmetrical, trachea midline and thyroid not enlarged, symmetric, no tenderness/mass/nodules Back: symmetric, no curvature. ROM normal. No CVA tenderness. Resp: clear to auscultation bilaterally Cardio: regular rate and rhythm, S1, S2 normal, no murmur, click, rub or gallop GI: soft, non-tender; bowel sounds normal; no masses,  no organomegaly Extremities: extremities normal, atraumatic, no cyanosis or edema Pulses: 2+ and symmetric Skin: Skin color, texture, turgor normal. No rashes or lesions Neurologic: Grossly normal  Lab Results:  Recent Labs  01/15/13 0510 01/16/13 0500  WBC 21.3* 19.9*  HGB 7.4* 7.0*  HCT 21.6* 20.1*  PLT 542* 516*   BMET  Recent Labs   01/15/13 0510 01/16/13 0500  NA 135 135  K 4.0 3.9  CL 103 105  CO2 22 23  GLUCOSE 113* 111*  BUN 9 11  CREATININE 0.59 0.58  CALCIUM 8.8 8.7    Studies/Results: No results found.  Medications: I have reviewed the patient's current medications.  Assessment/Plan: 32 year old gentleman with sickle cell crisis.  #1 sickle cell painful crisis: Patient still on his Dilaudid PCA. He's also on other medications including Toradol. He is still requiring high dose narcotics. I will add 1 mg of Dilaudid every 2 hours when necessary for breakthrough pain for the next 24 hours hopefully we can then begin to titrate him off in preparation for discharge. Continue other medications.  #2 sickle cell hemolytic anemia: His hemoglobin has dropped again to 7.0. He doesn't seem symptomatic at this point in terms of his vitals. I will recheck his hemoglobin in the morning. In the meantime continue to monitor patient.  #3 leukocytosis: His white count has decreased slightly. Most likely secondary to vaso-occlusive crisis.  #4 history of pulmonary embolism: Continue Xarelto.  LOS: 3 days   [name],[name] 01/16/2013, 8:30 AM

## 2013-01-16 NOTE — Progress Notes (Signed)
01/16/13 1545 Patient PCA setting were changed to 0.3/10/5 from 0.08/09/13. Night RN stated that last night she noticed that the pump had been changed.  PCA was reviewed by two RN's and pump is working appropriately. Will order new PCA to change out just for safety precautions. Will notify MD. Pump received will change.

## 2013-01-16 NOTE — ED Provider Notes (Signed)
Medical screening examination/treatment/procedure(s) were performed by non-physician practitioner and as supervising physician I was immediately available for consultation/collaboration.  EKG Interpretation    Date/Time:    Ventricular Rate:    PR Interval:    QRS Duration:   QT Interval:    QTC Calculation:   R Axis:     Text Interpretation:               Raeford Razor, MD 01/16/13 2057

## 2013-01-17 LAB — CBC WITH DIFFERENTIAL/PLATELET
Basophils Absolute: 0.2 10*3/uL — ABNORMAL HIGH (ref 0.0–0.1)
Eosinophils Absolute: 1.2 10*3/uL — ABNORMAL HIGH (ref 0.0–0.7)
Eosinophils Relative: 6 % — ABNORMAL HIGH (ref 0–5)
HCT: 21.3 % — ABNORMAL LOW (ref 39.0–52.0)
Hemoglobin: 7.1 g/dL — ABNORMAL LOW (ref 13.0–17.0)
Lymphs Abs: 3.5 10*3/uL (ref 0.7–4.0)
MCH: 31.1 pg (ref 26.0–34.0)
Monocytes Absolute: 2.3 10*3/uL — ABNORMAL HIGH (ref 0.1–1.0)
Monocytes Relative: 13 % — ABNORMAL HIGH (ref 3–12)
Neutro Abs: 10.7 10*3/uL — ABNORMAL HIGH (ref 1.7–7.7)
Neutrophils Relative %: 60 % (ref 43–77)
RBC: 2.28 MIL/uL — ABNORMAL LOW (ref 4.22–5.81)

## 2013-01-17 LAB — COMPREHENSIVE METABOLIC PANEL
AST: 26 U/L (ref 0–37)
Alkaline Phosphatase: 84 U/L (ref 39–117)
BUN: 10 mg/dL (ref 6–23)
CO2: 22 mEq/L (ref 19–32)
Calcium: 8.8 mg/dL (ref 8.4–10.5)
Chloride: 103 mEq/L (ref 96–112)
GFR calc Af Amer: >90 mL/min (ref >90)
GFR calc non Af Amer: >90 mL/min (ref >90)
Glucose, Bld: 128 mg/dL — ABNORMAL HIGH (ref 70–99)
Potassium: 3.8 mEq/L (ref 3.5–5.1)
Total Bilirubin: 4.2 mg/dL — ABNORMAL HIGH (ref 0.3–1.2)

## 2013-01-18 DIAGNOSIS — D5700 Hb-SS disease with crisis, unspecified: Secondary | ICD-10-CM | POA: Diagnosis not present

## 2013-01-18 MED ORDER — HYDROMORPHONE HCL PF 2 MG/ML IJ SOLN
2.0000 mg | INTRAMUSCULAR | Status: DC | PRN
Start: 2013-01-18 — End: 2013-01-19
  Administered 2013-01-18 – 2013-01-19 (×9): 2 mg via INTRAVENOUS
  Filled 2013-01-18 (×11): qty 1

## 2013-01-18 NOTE — Progress Notes (Signed)
SICKLE CELL SERVICE PROGRESS NOTE  [name] WUJ:[id number] DOB: 01/15/1981 DOA: 01/13/2013 PCP: Karima Carrell A., MD  Assessment/Plan: Principal Problem:   Sickle cell crisis Active Problems:   Hypertension   Avascular necrosis   Secondary hemochromatosis   Mood disorder   Sickle cell hemolytic anemia   Hx of pulmonary embolus   Leukocytosis, unspecified   Sickle cell anemia with pain   Uses marijuana   Atypical chest pain   Thrombocytosis   Low back pain   Rib pain   Dehydration  1. Hb SS with crisis: Pt states that pain is still 7/10 mostly localized to his ribs and sides. He has no component of pleurisy or hypoxemia.  He has used a total of 49.5 mg of Dilaudid on the PCA.I will continue PCA at current setting and reevaluate tomorrow. . Continue Toradol. Discontinue IVF. Reinforced guided imagery and mindful thought as adjuncts to managing pain. Had a long conversation with patient about his frustration with his disease getting worse as he ages. He is unwilling however, to use adjunct and integrative approaches to manage pain. Pt has a strong component of psychological distress which he is ignoring and refuses to address. He has refused Psychologist referral. 2. Vitamin D deficiency: Pt has a vitamin D level of <10. He has been started on  Ergocalciferol 50,000 Units TIW. 3. Leukocytosis: Pt shows no s/s of an infection. I suspect that this is related to the acute crisis.Will continue to monitor. 4. H/O Pulmonary embolus: Continue Xarelto 20 mg daily.  Code Status: Full Code Family Communication: N/A Disposition Plan: Home in 24-48 hours  Placida Cambre A.  Pager [phone]. If 7PM-7AM, please contact night-coverage.  01/18/2013, 11:21 AM  LOS: 5 days   Brief narrative: [name] is a 32 y.o. male who presented to emergency department complaining of pain thought related to an acute sickle cell crisis which he has had multiple times including 2 recent  hospitalizations. Patient reports pain in bilateral ribs, back, thighs. He has chronic daily pain that is usually 3-4/10 but reports that his pain is now 7-8/10. States this feels like his prior sickle cell crisis pain. Pt reports that pain began 1 day PTA. He took his regular medications for pain at home with minimal relief and unable to control pain at home. He denies any fever or chills. He denies any vomiting but has mild nausea. He denies SOB. No changes in bowels. No urinary symptoms. Nothing is making his symptoms worse. He does report that he believes that the cold weather is affecting his disease and causing more crisis events. He says that he is taking his home meds as prescribed and not missing doses. He even reports that for the last month he has taken his hydroxyurea twice daily without missing doses in order to try and prevent the frequent recurrence of crisis events. He admits that prior to 1 month ago he had not been taking it regularly. He says that he has had mild abdominal pain for the last 2 weeks that he atributed to his taking his hydroxyurea more regularly. He reports no tobacco use and no recent marijuana use ( in past 2 months ). Hospitalization was requested for further evaluation and management   Consultants:  None  Procedures:  None  Antibiotics:  None  HPI/Subjective:  Pt states that pain is still 8/10 mostly localized to his ribs and sides. He has no component of pleurisy or hypoxemia. Pt reports a BM yesterday.  Objective: Filed Vitals:  01/18/13 0011 01/18/13 0308 01/18/13 0535 01/18/13 0600  BP:  126/81  142/88  Pulse:  75  61  Temp:  98.7 F (37.1 C)  98.2 F (36.8 C)  TempSrc:  Oral  Oral  Resp: 18 16 18 16   Height:      Weight:    178 lb 9.2 oz (81 kg)  SpO2: 94% 97% 96% 95%   Weight change: 14.1 oz (0.4 kg)  Intake/Output Summary (Last 24 hours) at 01/18/13 1121 Last data filed at 01/18/13 1037  Gross per 24 hour  Intake    480 ml  Output    1250 ml  Net   -770 ml    General: Alert, awake, oriented x3, appears to be in much less distress today than he did yesterday.  HEENT: [place]/AT PEERL, EOMI, anicteric Heart: Regular rate and rhythm, without murmurs, rubs, gallops.  Lungs: Clear to auscultation, no wheezing or rhonchi noted.   Abdomen: Soft, nontender, nondistended, positive bowel sounds, no masses no hepatosplenomegaly noted.  Neuro: No focal neurological deficits noted cranial nerves II through XII grossly intact. Strength normal in bilateral upper and lower extremities. Musculoskeletal: No warm swelling or erythema around joints, no spinal tenderness noted. Psychiatric: Patient alert and oriented x3, good insight and cognition, good recent to remote recall.    Data Reviewed: Basic Metabolic Panel:  Recent Labs Lab 01/13/13 1140 01/13/13 1820 01/14/13 0540 01/15/13 0510 01/16/13 0500 01/17/13 0545  NA 135  --  137 135 135 136  K 4.1  --  3.7 4.0 3.9 3.8  CL 101  --  105 103 105 103  CO2 21  --  23 22 23 22   GLUCOSE 106*  --  112* 113* 111* 128*  BUN 16  --  11 9 11 10   CREATININE 0.74  --  0.65 0.59 0.58 0.63  CALCIUM 8.9  --  8.6 8.8 8.7 8.8  MG  --  1.9  --   --   --   --    Liver Function Tests:  Recent Labs Lab 01/13/13 1140 01/14/13 0540 01/15/13 0510 01/16/13 0500 01/17/13 0545  AST 38* 27 24 25 26   ALT 5 16 14 14 14   ALKPHOS 98 81 84 81 84  BILITOT 3.9* 4.4* 4.0* 4.2* 4.2*  PROT 7.7 7.3 7.3 6.8 7.4  ALBUMIN 4.1 3.9 3.8 3.7 3.9   No results found for this basename: LIPASE, AMYLASE,  in the last 168 hours No results found for this basename: AMMONIA,  in the last 168 hours CBC:  Recent Labs Lab 01/13/13 1140 01/14/13 0540 01/15/13 0510 01/16/13 0500 01/17/13 0545  WBC 18.7* 18.9* 21.3* 19.9* 17.9*  NEUTROABS 13.8* 11.5*  --  12.6* 10.7*  HGB 7.9* 7.3* 7.4* 7.0* 7.1*  HCT 24.0* 21.7* 21.6* 20.1* 21.3*  MCV 94.1 93.5 93.5 92.6 93.4  PLT 565* 542* 542* 516* 518*   Cardiac  Enzymes:  Recent Labs Lab 01/13/13 1820  TROPONINI <0.30   BNP (last 3 results)  Recent Labs  11/13/12 1826  PROBNP 106.3   Studies: Dg Chest 2 View  01/13/2013   CLINICAL DATA:  Chest pain.  Sickle cell crisis.  EXAM: CHEST  2 VIEW  COMPARISON:  01/03/2013, 11/13/2012, 01/14/2012.  FINDINGS: Cardiac silhouette mildly enlarged but stable. Hilar and mediastinal contours otherwise unremarkable. Prominent bronchovascular markings diffusely and mild central peribronchial thickening, unchanged. Linear scarring in the lower lobes, unchanged. Answer lungs otherwise clear left subclavian Port-A-Cath tip in the lower SVC at the  cavoatrial junction.  IMPRESSION: No acute cardiopulmonary disease. Stable mild changes of chronic bronchitis and/or asthma and stable scarring in the lower lobes. Stable mild cardiomegaly.   Electronically Signed   By: Hulan Saas M.D.   On: 01/13/2013 12:08   Dg Chest 2 View  01/03/2013   CLINICAL DATA:  Sickle cell crisis.  EXAM: CHEST  2 VIEW  COMPARISON:  11/17/2012 CT  FINDINGS: Mild cardiac enlargement and central vascular prominence. Linear retrocardiac opacity. No pleural effusion or pneumothorax. Surgical clips right upper quadrant. Left chest wall Port-A-Cath with tip projecting over the mid SVC. No acute osseous finding.  IMPRESSION: Cardiomegaly and central vascular prominence, similar to prior.  Retrocardiac opacity has decreased compared to the prior and now appears linear, therefore favored to reflect atelectasis/ scarring.   Electronically Signed   By: Jearld Lesch M.D.   On: 01/03/2013 03:12    Scheduled Meds: . folic acid  1 mg Oral q morning - 10a  . gabapentin  300 mg Oral QHS  . HYDROmorphone  4 mg Oral Q4H  . hydroxyurea  500 mg Oral BID WC  . ketorolac  30 mg Intravenous Q6H  . morphine  15 mg Oral BID  . pantoprazole  40 mg Oral Daily  . potassium chloride SA  20 mEq Oral q morning - 10a  . Rivaroxaban  20 mg Oral q morning - 10a  .  senna-docusate  1 tablet Oral BID  . Vitamin D (Ergocalciferol)  50,000 Units Oral 2 times weekly   Continuous Infusions: . dextrose 5 % and 0.45% NaCl 20 mL/hr at 01/16/13 0739    Total time 40 minutes

## 2013-01-18 NOTE — Progress Notes (Signed)
Subjective: Patient seen states his pain remains at 7/10 in ribs, back and hands. He used a total of  16.33 mg over the past 24hrs, making 14 demands and 14 received  Deliveries. He received  7 mg of dilaudid for break through pain.  Objective: Weight change: 0.4 kg (14.1 oz)  Intake/Output Summary (Last 24 hours) at 01/18/13 0819 Last data filed at 01/18/13 0308  Gross per 24 hour  Intake    720 ml  Output   1150 ml  Net   -430 ml   BP 142/88  Pulse 61  Temp(Src) 98.2 F (36.8 C) (Oral)  Resp 16  Ht 6' (1.829 m)  Wt 81 kg (178 lb 9.2 oz)  BMI 24.21 kg/m2  SpO2 95%  General Appearance:    Alert, cooperative, no distress, appears stated age  Head:    Normocephalic, without obvious abnormality, atraumatic  Eyes:    PERRL, conjunctiva/corneas clear,          Nose:   Nares normal, septum midline, mucosa normal, no drainage    or sinus tenderness  Throat:   Lips, mucosa, and tongue normal; teeth and gums normal  Neck:   Supple, symmetrical, trachea midline, no adenopathy;       thyroid:  No enlargement/tenderness/nodules; no carotid   bruit or JVD  Back:     Symmetric, no curvature, ROM normal, no CVA tenderness  Lungs:     Clear to auscultation bilaterally, respirations unlabored  Chest wall:    No tenderness or deformity, mediport left chest wall. No reproducible musculoskeletal pain  Heart:    Regular rate and rhythm, S1 and S2 normal, no murmur, rub   or gallop  Abdomen:     Soft, non-tender, bowel sounds active all four quadrants,    no masses, no organomegaly        Extremities:   Extremities normal, atraumatic, no cyanosis or edema  Pulses:   2+ and symmetric all extremities  Skin:   Skin color, texture, turgor normal, no rashes or lesions  Lymph nodes:   Cervical, supraclavicular, and axillary nodes normal  Neurologic:   CNII-XII intact. Normal strength, sensation and reflexes      throughout    Lab Results:  Recent Labs  01/16/13 0500 01/17/13 0545  NA 135  136  K 3.9 3.8  CL 105 103  CO2 23 22  GLUCOSE 111* 128*  BUN 11 10  CREATININE 0.58 0.63  CALCIUM 8.7 8.8    Recent Labs  01/16/13 0500 01/17/13 0545  AST 25 26  ALT 14 14  ALKPHOS 81 84  BILITOT 4.2* 4.2*  PROT 6.8 7.4  ALBUMIN 3.7 3.9   No results found for this basename: LIPASE, AMYLASE,  in the last 72 hours  Recent Labs  01/16/13 0500 01/17/13 0545  WBC 19.9* 17.9*  NEUTROABS 12.6* 10.7*  HGB 7.0* 7.1*  HCT 20.1* 21.3*  MCV 92.6 93.4  PLT 516* 518*   No results found for this basename: CKTOTAL, CKMB, CKMBINDEX, TROPONINI,  in the last 72 hours No components found with this basename: POCBNP,  No results found for this basename: DDIMER,  in the last 72 hours No results found for this basename: HGBA1C,  in the last 72 hours No results found for this basename: CHOL, HDL, LDLCALC, TRIG, CHOLHDL, LDLDIRECT,  in the last 72 hours No results found for this basename: TSH, T4TOTAL, FREET3, T3FREE, THYROIDAB,  in the last 72 hours No results found for this basename: VITAMINB12,  FOLATE, FERRITIN, TIBC, IRON, RETICCTPCT,  in the last 72 hours  Micro Results: No results found for this or any previous visit (from the past 240 hour(s)).  Studies/Results: Dg Chest 2 View  01/13/2013   CLINICAL DATA:  Chest pain.  Sickle cell crisis.  EXAM: CHEST  2 VIEW  COMPARISON:  01/03/2013, 11/13/2012, 01/14/2012.  FINDINGS: Cardiac silhouette mildly enlarged but stable. Hilar and mediastinal contours otherwise unremarkable. Prominent bronchovascular markings diffusely and mild central peribronchial thickening, unchanged. Linear scarring in the lower lobes, unchanged. Answer lungs otherwise clear left subclavian Port-A-Cath tip in the lower SVC at the cavoatrial junction.  IMPRESSION: No acute cardiopulmonary disease. Stable mild changes of chronic bronchitis and/or asthma and stable scarring in the lower lobes. Stable mild cardiomegaly.   Electronically Signed   By: Hulan Saas M.D.    On: 01/13/2013 12:08   Dg Chest 2 View  01/03/2013   CLINICAL DATA:  Sickle cell crisis.  EXAM: CHEST  2 VIEW  COMPARISON:  11/17/2012 CT  FINDINGS: Mild cardiac enlargement and central vascular prominence. Linear retrocardiac opacity. No pleural effusion or pneumothorax. Surgical clips right upper quadrant. Left chest wall Port-A-Cath with tip projecting over the mid SVC. No acute osseous finding.  IMPRESSION: Cardiomegaly and central vascular prominence, similar to prior.  Retrocardiac opacity has decreased compared to the prior and now appears linear, therefore favored to reflect atelectasis/ scarring.   Electronically Signed   By: Jearld Lesch M.D.   On: 01/03/2013 03:12   Medications: Scheduled Meds: . folic acid  1 mg Oral q morning - 10a  . gabapentin  300 mg Oral QHS  . HYDROmorphone PCA 0.3 mg/mL   Intravenous Q4H  . HYDROmorphone  4 mg Oral Q4H  . hydroxyurea  500 mg Oral BID WC  . ketorolac  30 mg Intravenous Q6H  . morphine  15 mg Oral BID  . pantoprazole  40 mg Oral Daily  . potassium chloride SA  20 mEq Oral q morning - 10a  . Rivaroxaban  20 mg Oral q morning - 10a  . senna-docusate  1 tablet Oral BID  . Vitamin D (Ergocalciferol)  50,000 Units Oral 2 times weekly   Continuous Infusions: . dextrose 5 % and 0.45% NaCl 20 mL/hr at 01/16/13 0739   PRN Meds:.acetaminophen, acetaminophen, diphenhydrAMINE, diphenhydrAMINE, HYDROmorphone (DILAUDID) injection, naloxone, ondansetron (ZOFRAN) IV, ondansetron, sodium chloride, zolpidem  Assessment/Plan: @PROBHOSP @  LOS: 5 days   Acute sickle cell pain crisis: Patient using less dilaudid daily, using physician assisste boluses more than PCA. Will d/c PCA, continue PRN dilaudid and begin transition to home meds. He's also on other medications including Toradol, PO dilaudid PRN and MS Contin.  Sickle cell hemolytic anemia: Hemoglobin stable at 7.0. Will check LDH and reticulocyte count in AM Leukocytosis: Most likely secondary to  vaso-occlusive crisis.  History of pulmonary embolism: Continue Xarelto   Code status: Full Code Dispo: home hopefully in next 24-48 hours   [name] 01/18/2013, 8:19 AM

## 2013-01-19 DIAGNOSIS — D5700 Hb-SS disease with crisis, unspecified: Secondary | ICD-10-CM | POA: Diagnosis not present

## 2013-01-19 LAB — COMPREHENSIVE METABOLIC PANEL
ALT: 17 U/L (ref 0–53)
AST: 30 U/L (ref 0–37)
Albumin: 3.8 g/dL (ref 3.5–5.2)
BUN: 6 mg/dL (ref 6–23)
Chloride: 103 mEq/L (ref 96–112)
Creatinine, Ser: 0.56 mg/dL (ref 0.50–1.35)
Sodium: 137 mEq/L (ref 135–145)
Total Bilirubin: 3.5 mg/dL — ABNORMAL HIGH (ref 0.3–1.2)
Total Protein: 7.4 g/dL (ref 6.0–8.3)

## 2013-01-19 LAB — CBC
Hemoglobin: 7 g/dL — ABNORMAL LOW (ref 13.0–17.0)
MCV: 93.2 fL (ref 78.0–100.0)
Platelets: 503 10*3/uL — ABNORMAL HIGH (ref 150–400)
RDW: 20 % — ABNORMAL HIGH (ref 11.5–15.5)
WBC: 16.8 10*3/uL — ABNORMAL HIGH (ref 4.0–10.5)

## 2013-01-19 LAB — RETICULOCYTES
RBC.: 2.22 MIL/uL — ABNORMAL LOW (ref 4.22–5.81)
Retic Count, Absolute: 217.6 10*3/uL — ABNORMAL HIGH (ref 19.0–186.0)
Retic Ct Pct: 9.8 % — ABNORMAL HIGH (ref 0.4–3.1)

## 2013-01-19 LAB — LACTATE DEHYDROGENASE: LDH: 407 U/L — ABNORMAL HIGH (ref 94–250)

## 2013-01-19 MED ORDER — POTASSIUM CHLORIDE CRYS ER 20 MEQ PO TBCR
20.0000 meq | EXTENDED_RELEASE_TABLET | Freq: Once | ORAL | Status: AC
  Administered 2013-01-19: 20 meq via ORAL
  Filled 2013-01-19: qty 1

## 2013-01-19 MED ORDER — HYDROMORPHONE HCL PF 2 MG/ML IJ SOLN
3.0000 mg | INTRAMUSCULAR | Status: DC | PRN
  Administered 2013-01-19 – 2013-01-21 (×24): 3 mg via INTRAVENOUS
  Filled 2013-01-19 (×24): qty 2

## 2013-01-19 NOTE — Progress Notes (Signed)
Subjective: Patient seen, states pain remains at 7/10, located in the same area plus left shoulders and hands. He is off the PCA pump since yesterday.  Objective: Weight change: -1.7 kg (-3 lb 12 oz)  Intake/Output Summary (Last 24 hours) at 01/19/13 1131 Last data filed at 01/19/13 0900  Gross per 24 hour  Intake   1200 ml  Output   4025 ml  Net  -2825 ml   BP 133/80  Pulse 65  Temp(Src) 98.5 F (36.9 C) (Oral)  Resp 18  Ht 6' (1.829 m)  Wt 79.3 kg (174 lb 13.2 oz)  BMI 23.71 kg/m2  SpO2 100%  General Appearance:    Alert, cooperative, no distress, appears stated age  Head:    Normocephalic, without obvious abnormality, atraumatic  Eyes:    PERRL, conjunctiva/corneas clear         Nose:   Nares normal, septum midline, mucosa normal, no drainage    or sinus tenderness  Throat:   Lips, mucosa, and tongue normal; teeth and gums normal  Neck:   Supple, symmetrical, trachea midline, no adenopathy;       thyroid:  No enlargement/tenderness/nodules; no carotid   bruit or JVD  Back:     Symmetric, no curvature, ROM normal, no CVA tenderness  Lungs:     Clear to auscultation bilaterally, respirations unlabored  Chest wall:    No tenderness or deformity  Heart:    Regular rate and rhythm, S1 and S2 normal, no murmur, rub   or gallop  Abdomen:     Soft, non-tender, bowel sounds active all four quadrants,    no masses, no organomegaly        Extremities:   Extremities normal, atraumatic, no cyanosis or edema  Pulses:   2+ and symmetric all extremities  Skin:   Skin color, texture, turgor normal, no rashes or lesions  Lymph nodes:   Cervical, supraclavicular, and axillary nodes normal  Neurologic:   CNII-XII intact. Normal strength, sensation and reflexes      throughout    Lab Results:  Recent Labs  01/17/13 0545 01/19/13 0633  NA 136 137  K 3.8 3.4*  CL 103 103  CO2 22 24  GLUCOSE 128* 124*  BUN 10 6  CREATININE 0.63 0.56  CALCIUM 8.8 8.9    Recent Labs  01/17/13 0545 01/19/13 0633  AST 26 30  ALT 14 17  ALKPHOS 84 82  BILITOT 4.2* 3.5*  PROT 7.4 7.4  ALBUMIN 3.9 3.8   No results found for this basename: LIPASE, AMYLASE,  in the last 72 hours  Recent Labs  01/17/13 0545 01/19/13 0633  WBC 17.9* 16.8*  NEUTROABS 10.7*  --   HGB 7.1* 7.0*  HCT 21.3* 20.7*  MCV 93.4 93.2  PLT 518* 503*   No results found for this basename: CKTOTAL, CKMB, CKMBINDEX, TROPONINI,  in the last 72 hours No components found with this basename: POCBNP,  No results found for this basename: DDIMER,  in the last 72 hours No results found for this basename: HGBA1C,  in the last 72 hours No results found for this basename: CHOL, HDL, LDLCALC, TRIG, CHOLHDL, LDLDIRECT,  in the last 72 hours No results found for this basename: TSH, T4TOTAL, FREET3, T3FREE, THYROIDAB,  in the last 72 hours  Recent Labs  01/19/13 0633  RETICCTPCT 9.8*    Micro Results: No results found for this or any previous visit (from the past 240 hour(s)).  Studies/Results: Dg Chest 2 View  01/13/2013   CLINICAL DATA:  Chest pain.  Sickle cell crisis.  EXAM: CHEST  2 VIEW  COMPARISON:  01/03/2013, 11/13/2012, 01/14/2012.  FINDINGS: Cardiac silhouette mildly enlarged but stable. Hilar and mediastinal contours otherwise unremarkable. Prominent bronchovascular markings diffusely and mild central peribronchial thickening, unchanged. Linear scarring in the lower lobes, unchanged. Answer lungs otherwise clear left subclavian Port-A-Cath tip in the lower SVC at the cavoatrial junction.  IMPRESSION: No acute cardiopulmonary disease. Stable mild changes of chronic bronchitis and/or asthma and stable scarring in the lower lobes. Stable mild cardiomegaly.   Electronically Signed   By: Hulan Saas M.D.   On: 01/13/2013 12:08   Dg Chest 2 View  01/03/2013   CLINICAL DATA:  Sickle cell crisis.  EXAM: CHEST  2 VIEW  COMPARISON:  11/17/2012 CT  FINDINGS: Mild cardiac enlargement and central  vascular prominence. Linear retrocardiac opacity. No pleural effusion or pneumothorax. Surgical clips right upper quadrant. Left chest wall Port-A-Cath with tip projecting over the mid SVC. No acute osseous finding.  IMPRESSION: Cardiomegaly and central vascular prominence, similar to prior.  Retrocardiac opacity has decreased compared to the prior and now appears linear, therefore favored to reflect atelectasis/ scarring.   Electronically Signed   By: Jearld Lesch M.D.   On: 01/03/2013 03:12   Medications: Scheduled Meds: . folic acid  1 mg Oral q morning - 10a  . gabapentin  300 mg Oral QHS  . HYDROmorphone  4 mg Oral Q4H  . hydroxyurea  500 mg Oral BID WC  . morphine  15 mg Oral BID  . pantoprazole  40 mg Oral Daily  . potassium chloride SA  20 mEq Oral q morning - 10a  . Rivaroxaban  20 mg Oral q morning - 10a  . senna-docusate  1 tablet Oral BID  . Vitamin D (Ergocalciferol)  50,000 Units Oral 2 times weekly   Continuous Infusions: . dextrose 5 % and 0.45% NaCl 20 mL/hr at 01/19/13 0300   PRN Meds:.acetaminophen, acetaminophen, diphenhydrAMINE, diphenhydrAMINE, HYDROmorphone (DILAUDID) injection, ondansetron (ZOFRAN) IV, ondansetron, zolpidem  Assessment/Plan: @PROBHOSP @  LOS: 6 days   Acute sickle cell pain crisis: PCA discontinued yesterday, continue PRN dilaudid, will increase to 3mg  every 2hrs PRN, hopeful discharge in AM. continue home meds of PO dilaudid PRN and MS Contin. Continue Toradol. BMP in AM.  Sickle cell hemolytic anemia: Hemoglobin stable at 7.0. LDH and reticulocyte count reviewed. Leukocytosis: Most likely secondary to vaso-occlusive crisis.  History of pulmonary embolism: Continue Xarelto    Code status: Full Code  Dispo: home hopefully in next 24 hours   [name] 01/19/2013, 11:31 AM

## 2013-01-20 DIAGNOSIS — D5700 Hb-SS disease with crisis, unspecified: Secondary | ICD-10-CM | POA: Diagnosis not present

## 2013-01-20 NOTE — Progress Notes (Signed)
Subjective: Patient seen, states pain now mainly in left shoulder 6/10.  Objective: Weight change: -0.4 kg (-14.1 oz)  Intake/Output Summary (Last 24 hours) at 01/20/13 1226 Last data filed at 01/20/13 0144  Gross per 24 hour  Intake    240 ml  Output   1000 ml  Net   -760 ml   BP 119/80  Pulse 78  Temp(Src) 98.4 F (36.9 C) (Oral)  Resp 18  Ht 6' (1.829 m)  Wt 78.9 kg (173 lb 15.1 oz)  BMI 23.59 kg/m2  SpO2 100%  General Appearance:    Alert, cooperative, no distress, appears stated age  Head:    Normocephalic, without obvious abnormality, atraumatic  Eyes:    PERRL, conjunctiva/corneas clear,       Nose:   Nares normal, septum midline, mucosa normal, no drainage    or sinus tenderness  Throat:   Lips, mucosa, and tongue normal; teeth and gums normal  Neck:   Supple, symmetrical, trachea midline, no adenopathy;       thyroid:  No enlargement/tenderness/nodules; no carotid   bruit or JVD  Back:     Symmetric, no curvature, ROM normal, no CVA tenderness  Lungs:     Clear to auscultation bilaterally, respirations unlabored  Chest wall:    No tenderness or deformity  Heart:    Regular rate and rhythm, S1 and S2 normal, no murmur, rub   or gallop  Abdomen:     Soft, non-tender, bowel sounds active all four quadrants,    no masses, no organomegaly        Extremities:   Extremities normal, atraumatic, no cyanosis or edema  Pulses:   2+ and symmetric all extremities  Skin:   Skin color, texture, turgor normal, no rashes or lesions  Lymph nodes:   Cervical, supraclavicular, and axillary nodes normal  Neurologic:   CNII-XII intact. Normal strength, sensation and reflexes      throughout    Lab Results:  Recent Labs  01/19/13 0633  NA 137  K 3.4*  CL 103  CO2 24  GLUCOSE 124*  BUN 6  CREATININE 0.56  CALCIUM 8.9    Recent Labs  01/19/13 0633  AST 30  ALT 17  ALKPHOS 82  BILITOT 3.5*  PROT 7.4  ALBUMIN 3.8   No results found for this basename: LIPASE,  AMYLASE,  in the last 72 hours  Recent Labs  01/19/13 0633  WBC 16.8*  HGB 7.0*  HCT 20.7*  MCV 93.2  PLT 503*   No results found for this basename: CKTOTAL, CKMB, CKMBINDEX, TROPONINI,  in the last 72 hours No components found with this basename: POCBNP,  No results found for this basename: DDIMER,  in the last 72 hours No results found for this basename: HGBA1C,  in the last 72 hours No results found for this basename: CHOL, HDL, LDLCALC, TRIG, CHOLHDL, LDLDIRECT,  in the last 72 hours No results found for this basename: TSH, T4TOTAL, FREET3, T3FREE, THYROIDAB,  in the last 72 hours  Recent Labs  01/19/13 0633  RETICCTPCT 9.8*    Micro Results: No results found for this or any previous visit (from the past 240 hour(s)).  Studies/Results: Dg Chest 2 View  01/13/2013   CLINICAL DATA:  Chest pain.  Sickle cell crisis.  EXAM: CHEST  2 VIEW  COMPARISON:  01/03/2013, 11/13/2012, 01/14/2012.  FINDINGS: Cardiac silhouette mildly enlarged but stable. Hilar and mediastinal contours otherwise unremarkable. Prominent bronchovascular markings diffusely and mild central peribronchial  thickening, unchanged. Linear scarring in the lower lobes, unchanged. Answer lungs otherwise clear left subclavian Port-A-Cath tip in the lower SVC at the cavoatrial junction.  IMPRESSION: No acute cardiopulmonary disease. Stable mild changes of chronic bronchitis and/or asthma and stable scarring in the lower lobes. Stable mild cardiomegaly.   Electronically Signed   By: Hulan Saas M.D.   On: 01/13/2013 12:08   Dg Chest 2 View  01/03/2013   CLINICAL DATA:  Sickle cell crisis.  EXAM: CHEST  2 VIEW  COMPARISON:  11/17/2012 CT  FINDINGS: Mild cardiac enlargement and central vascular prominence. Linear retrocardiac opacity. No pleural effusion or pneumothorax. Surgical clips right upper quadrant. Left chest wall Port-A-Cath with tip projecting over the mid SVC. No acute osseous finding.  IMPRESSION: Cardiomegaly  and central vascular prominence, similar to prior.  Retrocardiac opacity has decreased compared to the prior and now appears linear, therefore favored to reflect atelectasis/ scarring.   Electronically Signed   By: Jearld Lesch M.D.   On: 01/03/2013 03:12   Medications: Scheduled Meds: . folic acid  1 mg Oral q morning - 10a  . gabapentin  300 mg Oral QHS  . HYDROmorphone  4 mg Oral Q4H  . hydroxyurea  500 mg Oral BID WC  . morphine  15 mg Oral BID  . pantoprazole  40 mg Oral Daily  . potassium chloride SA  20 mEq Oral q morning - 10a  . Rivaroxaban  20 mg Oral q morning - 10a  . senna-docusate  1 tablet Oral BID  . Vitamin D (Ergocalciferol)  50,000 Units Oral 2 times weekly   Continuous Infusions: . dextrose 5 % and 0.45% NaCl 20 mL/hr at 01/19/13 0300   PRN Meds:.acetaminophen, acetaminophen, diphenhydrAMINE, diphenhydrAMINE, HYDROmorphone (DILAUDID) injection, ondansetron (ZOFRAN) IV, ondansetron, zolpidem  Assessment/Plan: @PROBHOSP @  LOS: 7 days   Acute sickle cell pain crisis: Much improved pain almost resolved. Watch another 24hrs then discharge. Continue Po dilaudid, Toradol, PO dilaudid PRN and MS Contin.  Sickle cell hemolytic anemia: Hemoglobin stable at 7.0. Leukocytosis: Most likely secondary to vaso-occlusive crisis.  History of pulmonary embolism: Continue Xarelto  Code status: Full Code  Dispo: home hopefully in next 24 hours   Nolene Rocks 01/20/2013, 12:26 PM

## 2013-01-21 ENCOUNTER — Other Ambulatory Visit: Payer: Self-pay | Admitting: Internal Medicine

## 2013-01-21 DIAGNOSIS — D5700 Hb-SS disease with crisis, unspecified: Secondary | ICD-10-CM

## 2013-01-21 DIAGNOSIS — R0789 Other chest pain: Secondary | ICD-10-CM | POA: Diagnosis not present

## 2013-01-21 DIAGNOSIS — D571 Sickle-cell disease without crisis: Secondary | ICD-10-CM

## 2013-01-21 DIAGNOSIS — E860 Dehydration: Secondary | ICD-10-CM | POA: Diagnosis not present

## 2013-01-21 MED ORDER — HYDROMORPHONE HCL 4 MG PO TABS: 4.0000 mg | ORAL_TABLET | ORAL | Status: DC | PRN

## 2013-01-21 MED ORDER — HEPARIN SOD (PORK) LOCK FLUSH 100 UNIT/ML IV SOLN
500.0000 [IU] | INTRAVENOUS | Status: DC | PRN
  Filled 2013-01-21: qty 5

## 2013-01-21 MED ORDER — MORPHINE SULFATE ER 15 MG PO TBCR: 15.0000 mg | EXTENDED_RELEASE_TABLET | Freq: Two times a day (BID) | ORAL | Status: DC

## 2013-01-21 NOTE — Discharge Summary (Signed)
Physician Discharge Summary  Patient ID: [name] MRN: [id number] DOB/AGE: [birth year] 33 y.o.  Admit date: 01/13/2013 Discharge date: 01/21/2013  Admission Diagnoses:  Discharge Diagnoses:  Principal Problem:   Sickle cell crisis Active Problems:   Avascular necrosis   Secondary hemochromatosis   Mood disorder   Hypertension   Sickle cell hemolytic anemia   Hx of pulmonary embolus   Leukocytosis, unspecified   Sickle cell anemia with pain   Uses marijuana   Atypical chest pain   Thrombocytosis   Low back pain   Rib pain   Dehydration   Discharged Condition: good  Hospital Course: A 32 year old gentleman admitted with sickle cell painful crisis and known history of chronic pain. He was treated with IV Dilaudid, Toradol, IV fluids, as well as supportive care. He was placed on Dilaudid PCA when he was here and did very well with that. We gradually restart his oral medications and as the time of discharge she was back to his baseline. He was subsequently discharged home on his home medications to follow up with Dr. Ashley Royalty. Patient was is also to continue his hydroxyurea as well as his overall till 4 history of pulmonary embolism. He has chronic thrombocytosis which was noted but has been stable this admission.  Consults: None  Significant Diagnostic Studies: labs: Serial CBCs and CMP his wife checked. No significant hemolysis noted this admission  Treatments: IV hydration and analgesia: Dilaudid  Discharge Exam: Blood pressure 121/78, pulse 62, temperature 98.1 F (36.7 C), temperature source Oral, resp. rate 18, height 6' (1.829 m), weight 78.3 kg (172 lb 9.9 oz), SpO2 100.00%. General appearance: alert, cooperative and no distress Eyes: conjunctivae/corneas clear. PERRL, EOM's intact. Fundi benign. Neck: no adenopathy, no carotid bruit, no JVD, supple, symmetrical, trachea midline and thyroid not enlarged, symmetric, no tenderness/mass/nodules Back: symmetric, no  curvature. ROM normal. No CVA tenderness. Resp: clear to auscultation bilaterally Chest wall: no tenderness Cardio: regular rate and rhythm, S1, S2 normal, no murmur, click, rub or gallop GI: soft, non-tender; bowel sounds normal; no masses,  no organomegaly Extremities: extremities normal, atraumatic, no cyanosis or edema Pulses: 2+ and symmetric Skin: Skin color, texture, turgor normal. No rashes or lesions Neurologic: Grossly normal  Disposition: 01-Home or Self Care     Medication List         celecoxib 200 MG capsule  Commonly known as:  CELEBREX  Take 1 capsule (200 mg total) by mouth 2 (two) times daily.     folic acid 1 MG tablet  Commonly known as:  FOLVITE  Take 1 tablet (1 mg total) by mouth every morning.     HYDROmorphone 4 MG tablet  Commonly known as:  DILAUDID  Take 4 mg of dilaudid every 4 hours ATC for the next 24 hours then as needed for pain.     hydroxyurea 500 MG capsule  Commonly known as:  HYDREA  Take 500 mg by mouth 2 (two) times daily with a meal. May take with food to minimize GI side effects.     morphine 15 MG 12 hr tablet  Commonly known as:  MS CONTIN  Take 1 tablet (15 mg total) by mouth 2 (two) times daily.     potassium chloride SA 20 MEQ tablet  Commonly known as:  K-DUR,KLOR-CON  Take 1 tablet (20 mEq total) by mouth every morning.     Rivaroxaban 20 MG Tabs tablet  Commonly known as:  XARELTO  Take 1 tablet (20 mg total) by  mouth every morning.     zolpidem 10 MG tablet  Commonly known as:  AMBIEN  Take 1 tablet (10 mg total) by mouth at bedtime as needed for sleep.         SignedLonia Blood 01/21/2013, 10:16 AM

## 2013-01-21 NOTE — Progress Notes (Signed)
Pt. Was discharged home. He was given his discharge information and all questions were answered.  He was transported home by a friend.

## 2013-01-21 NOTE — Progress Notes (Unsigned)
Prescriptions issued for MS Contin 15 mg #60 and Dilaudid 4 mg #90.

## 2013-01-26 IMAGING — CR DG CHEST 2V
2 series · 2 of 2 positions shown · non-contrast
Comparison: 02/22/2010

CLINICAL DATA: Bilateral chest pain

CHEST - 2 VIEW

[w chest pa]
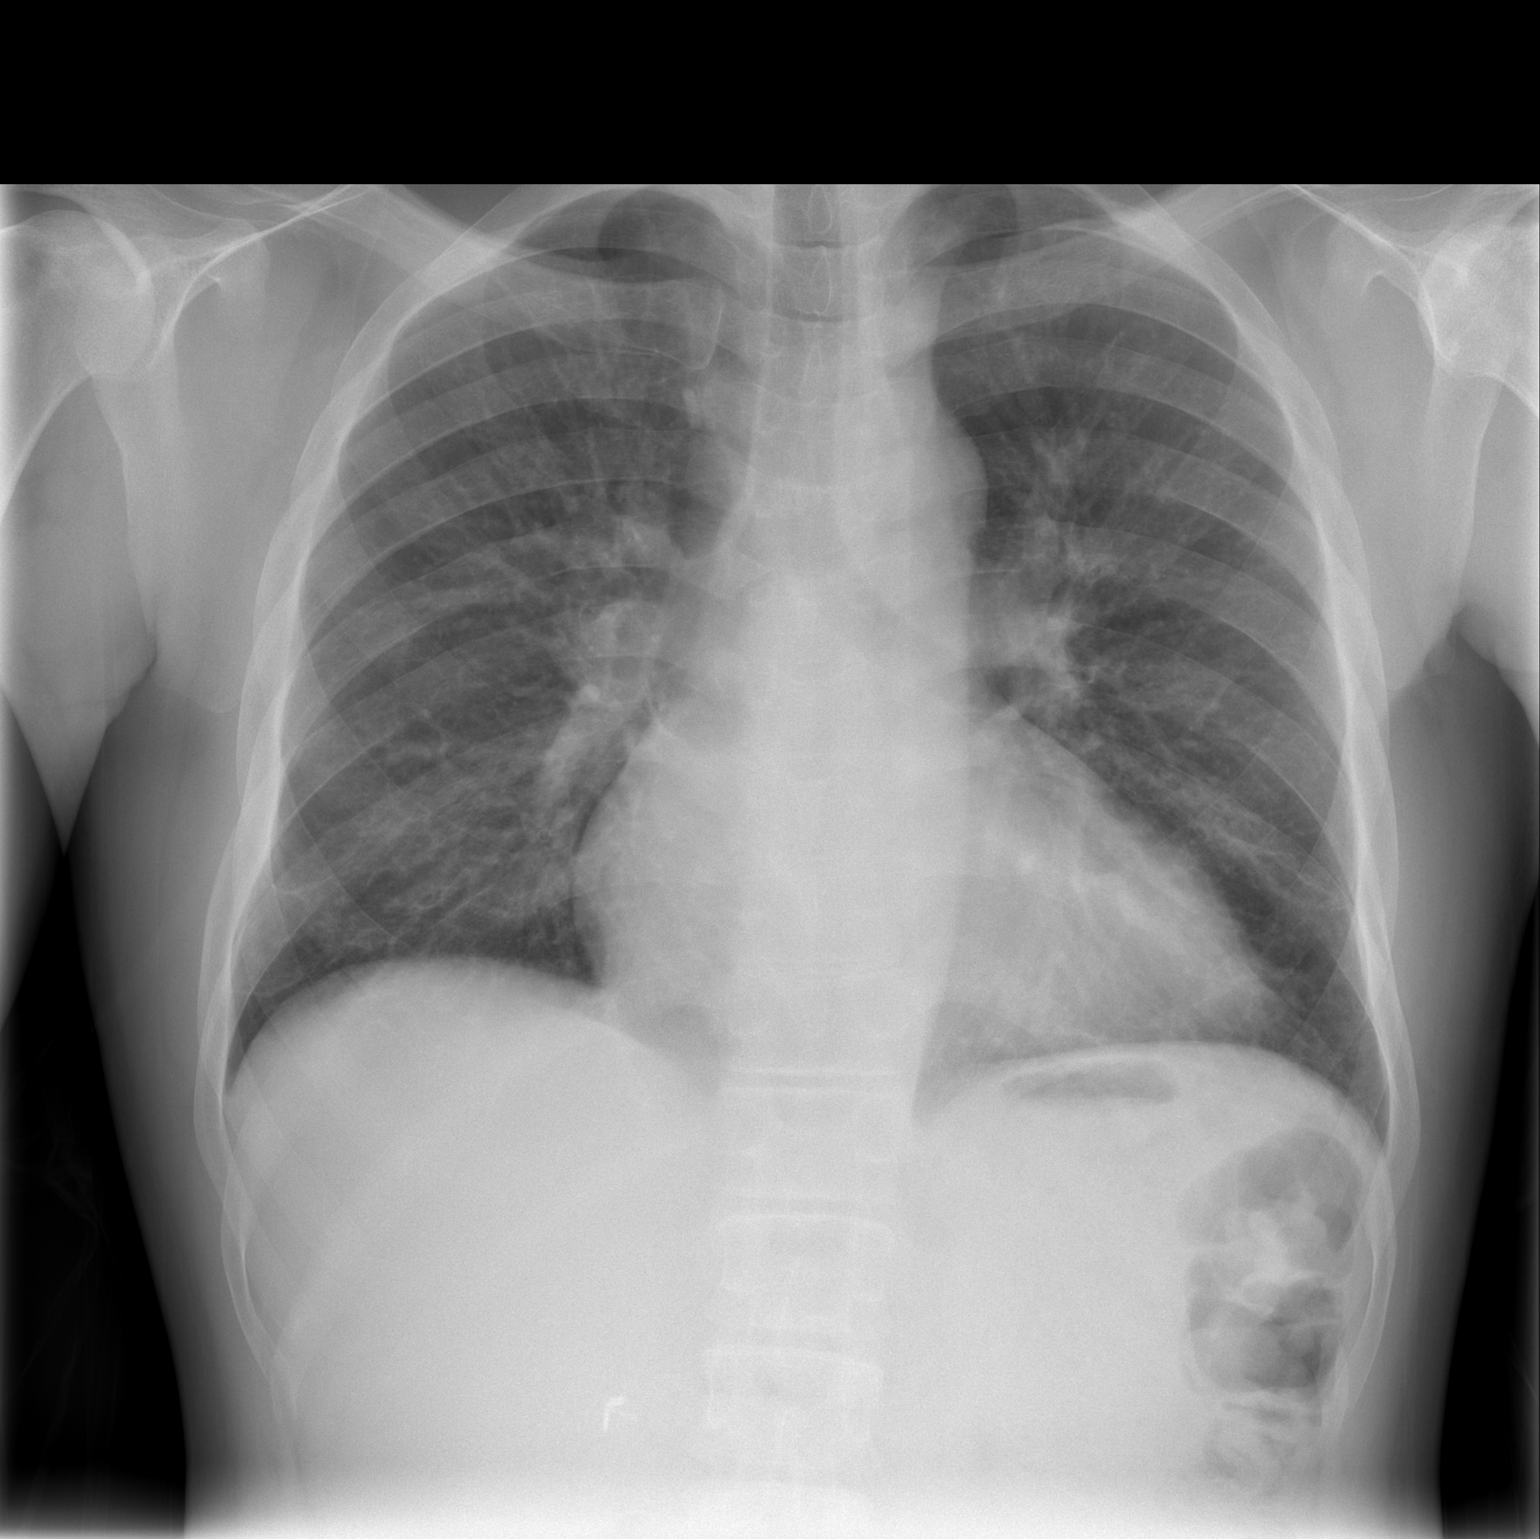

[w chest lat]
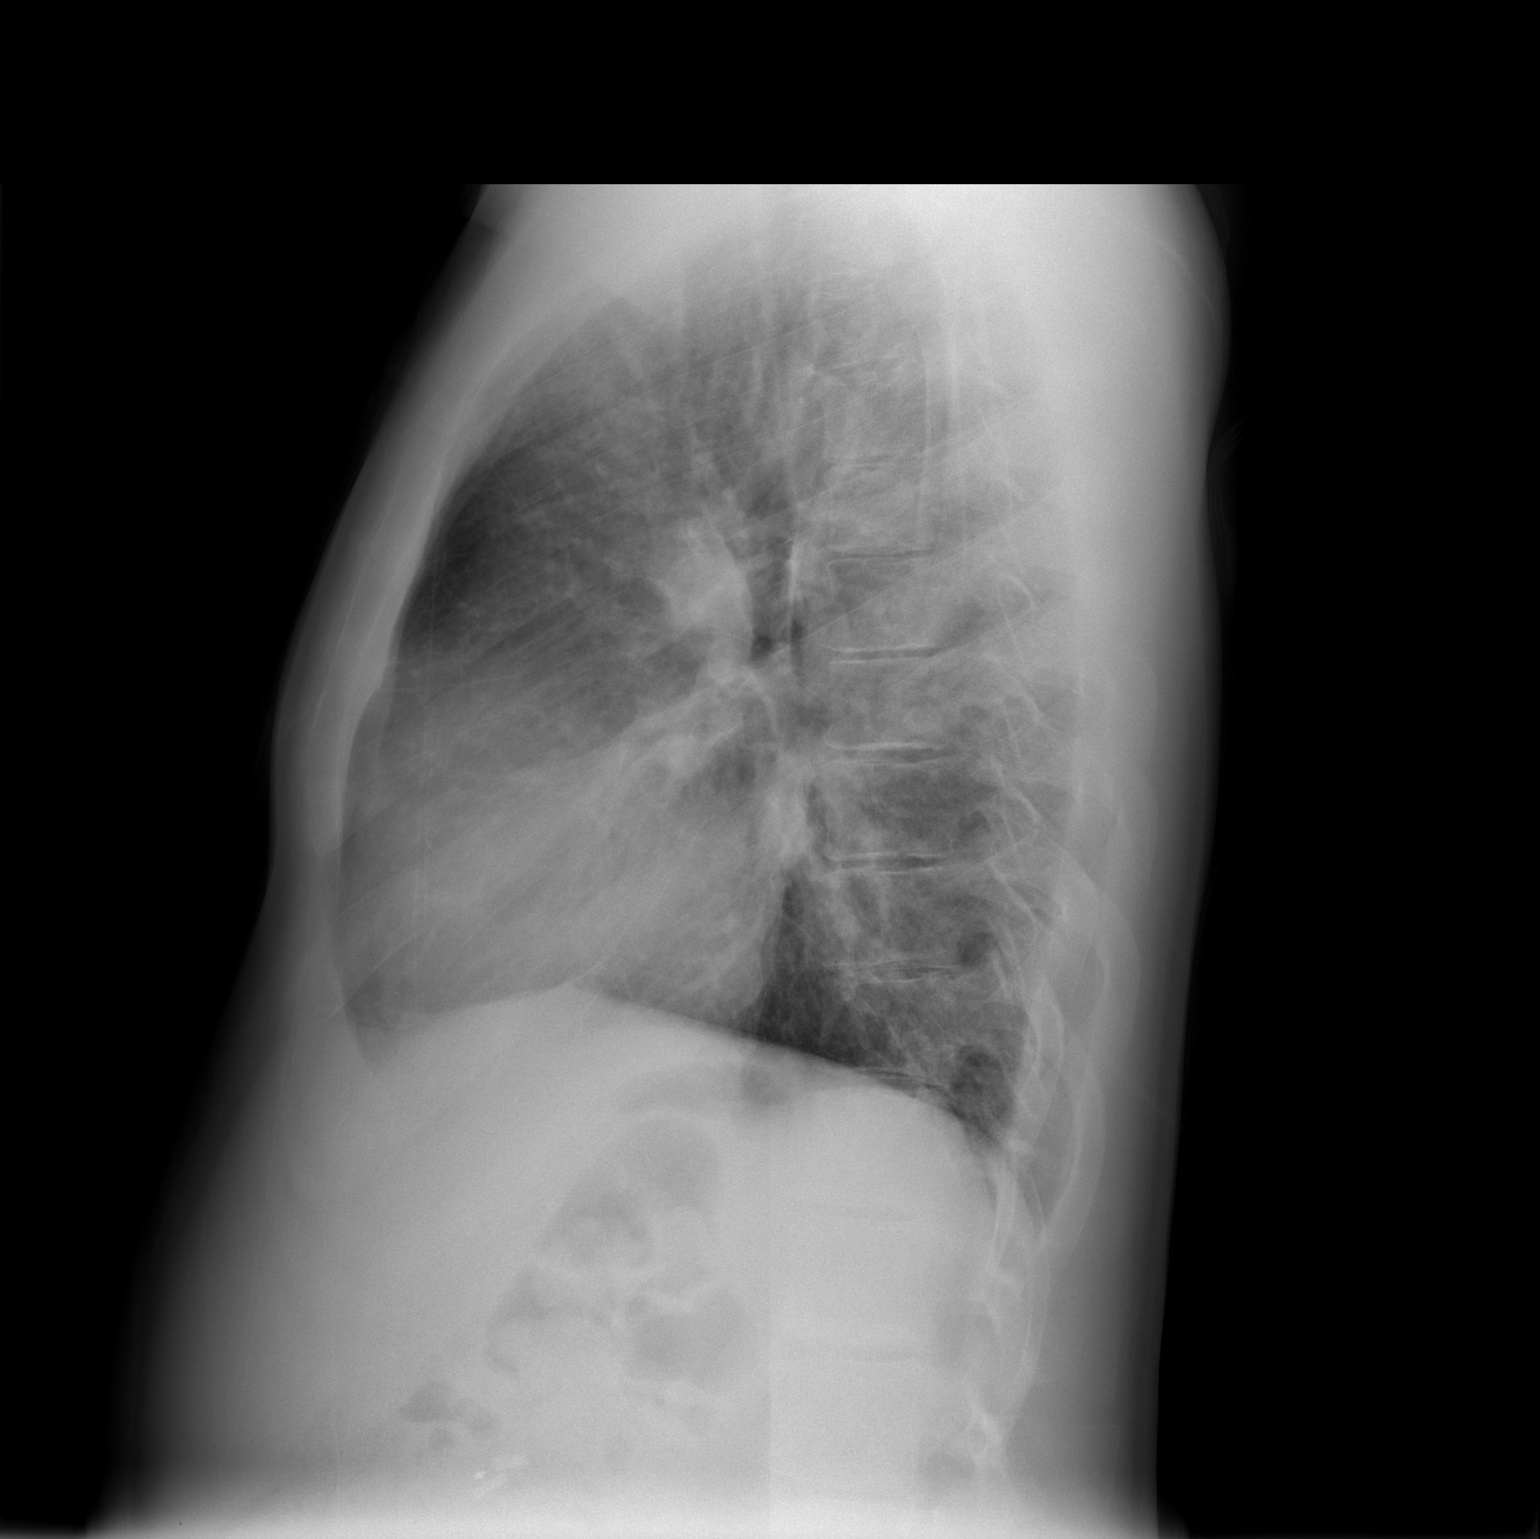

[2 of 2 positions shown; findings below may reference images not displayed]

FINDINGS: Heart size is mildly enlarged.

No pleural effusion or pulmonary edema.

No airspace consolidation is identified.

Coarsened interstitial markings appears similar to previous exam.

No airspace consolidation at the identified.
IMPRESSION: 1.  Chronic interstitial changes without evidence for superimposed
pneumonia.

2.  Mild cardiac enlargement.

## 2013-01-29 ENCOUNTER — Telehealth (HOSPITAL_COMMUNITY): Payer: Self-pay | Admitting: *Deleted

## 2013-01-29 ENCOUNTER — Encounter (HOSPITAL_COMMUNITY): Payer: Self-pay | Admitting: Hematology

## 2013-01-29 ENCOUNTER — Non-Acute Institutional Stay (HOSPITAL_COMMUNITY)
Admission: AD | Admit: 2013-01-29 | Discharge: 2013-01-29 | Disposition: A | Discharge status: Home / Self Care | Payer: Medicare Other | Attending: Internal Medicine | Admitting: Internal Medicine

## 2013-01-29 DIAGNOSIS — I10 Essential (primary) hypertension: Secondary | ICD-10-CM | POA: Insufficient documentation

## 2013-01-29 DIAGNOSIS — Z79899 Other long term (current) drug therapy: Secondary | ICD-10-CM | POA: Diagnosis not present

## 2013-01-29 DIAGNOSIS — G8929 Other chronic pain: Secondary | ICD-10-CM | POA: Diagnosis not present

## 2013-01-29 DIAGNOSIS — M549 Dorsalgia, unspecified: Secondary | ICD-10-CM | POA: Diagnosis not present

## 2013-01-29 DIAGNOSIS — Z87891 Personal history of nicotine dependence: Secondary | ICD-10-CM | POA: Diagnosis not present

## 2013-01-29 DIAGNOSIS — D5700 Hb-SS disease with crisis, unspecified: Secondary | ICD-10-CM | POA: Diagnosis not present

## 2013-01-29 DIAGNOSIS — Z96649 Presence of unspecified artificial hip joint: Secondary | ICD-10-CM | POA: Insufficient documentation

## 2013-01-29 DIAGNOSIS — Z86711 Personal history of pulmonary embolism: Secondary | ICD-10-CM | POA: Insufficient documentation

## 2013-01-29 DIAGNOSIS — Z86718 Personal history of other venous thrombosis and embolism: Secondary | ICD-10-CM | POA: Insufficient documentation

## 2013-01-29 DIAGNOSIS — Z0289 Encounter for other administrative examinations: Secondary | ICD-10-CM | POA: Diagnosis not present

## 2013-01-29 LAB — CBC WITH DIFFERENTIAL/PLATELET
Basophils Absolute: 0.2 10*3/uL — ABNORMAL HIGH (ref 0.0–0.1)
Eosinophils Absolute: 0.5 10*3/uL (ref 0.0–0.7)
Eosinophils Relative: 3 % (ref 0–5)
HCT: 22.3 % — ABNORMAL LOW (ref 39.0–52.0)
Lymphocytes Relative: 17 % (ref 12–46)
MCH: 32.3 pg (ref 26.0–34.0)
MCHC: 33.6 g/dL (ref 30.0–36.0)
MCV: 96.1 fL (ref 78.0–100.0)
Monocytes Absolute: 2.1 10*3/uL — ABNORMAL HIGH (ref 0.1–1.0)
Platelets: 513 10*3/uL — ABNORMAL HIGH (ref 150–400)
RBC: 2.32 MIL/uL — ABNORMAL LOW (ref 4.22–5.81)
RDW: 19.6 % — ABNORMAL HIGH (ref 11.5–15.5)
WBC: 17 10*3/uL — ABNORMAL HIGH (ref 4.0–10.5)

## 2013-01-29 LAB — COMPREHENSIVE METABOLIC PANEL
ALT: 16 U/L (ref 0–53)
Albumin: 3.8 g/dL (ref 3.5–5.2)
Alkaline Phosphatase: 80 U/L (ref 39–117)
BUN: 8 mg/dL (ref 6–23)
CO2: 24 mEq/L (ref 19–32)
Chloride: 107 mEq/L (ref 96–112)
Potassium: 3.4 mEq/L — ABNORMAL LOW (ref 3.7–5.3)
Total Bilirubin: 4.2 mg/dL — ABNORMAL HIGH (ref 0.3–1.2)
Total Protein: 7.3 g/dL (ref 6.0–8.3)

## 2013-01-29 IMAGING — CR DG SHOULDER 1V*L*
1 series · 2 of 2 positions shown · non-contrast
Comparison: 06/05/2009

CLINICAL DATA: Sickle cell disease, left shoulder pain

PORTABLE LEFT SHOULDER - 2+ VIEW

[Series 1: ap int/ext rotation · left · 2 of 2 slices shown]
[im 1/2]
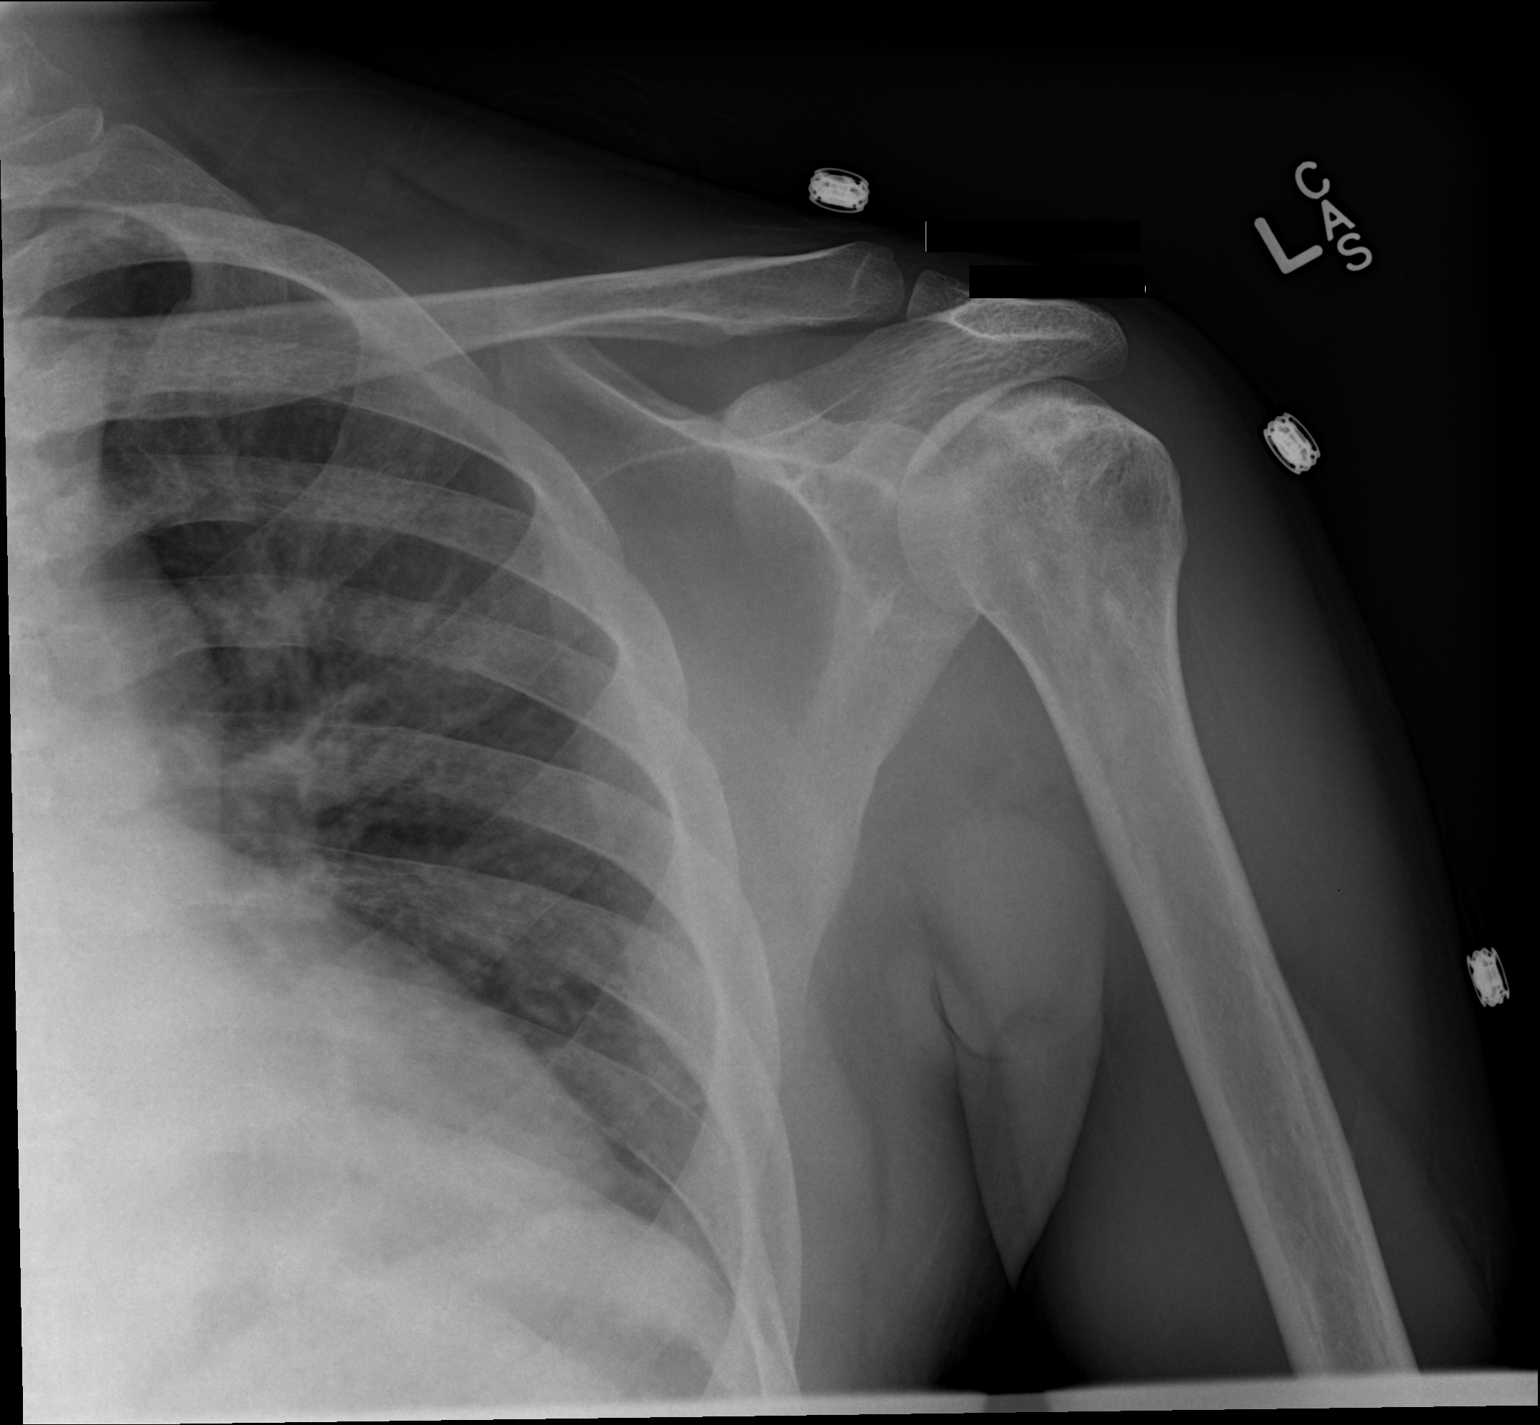
[im 2/2]
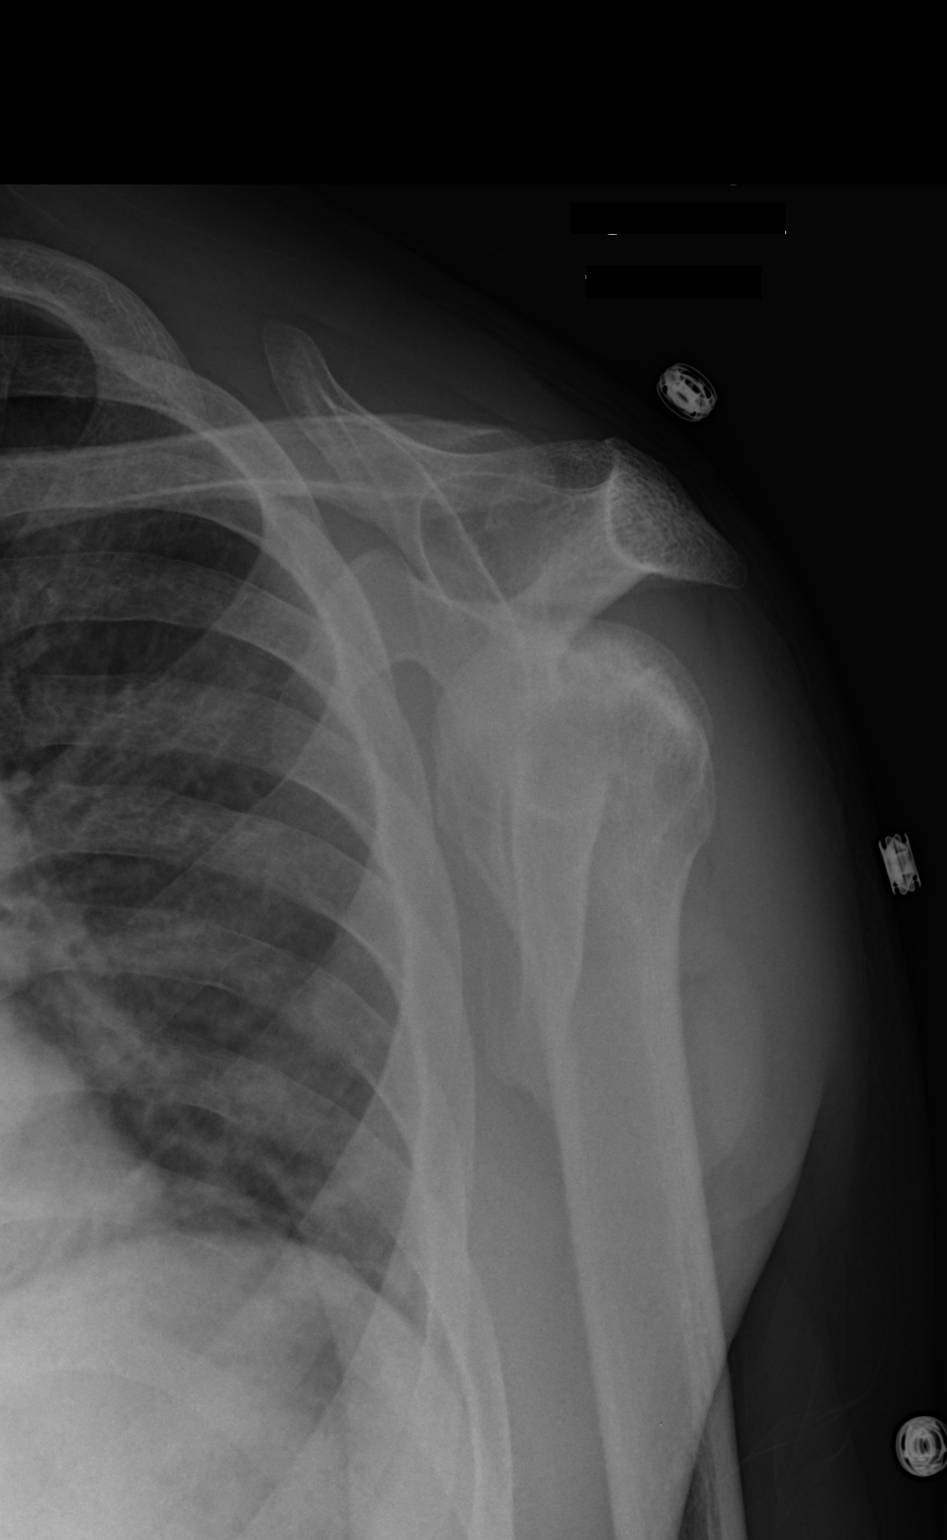

[2 of 2 positions shown; findings below may reference images not displayed]

FINDINGS: Mild diffuse osteosclerosis.
Changes of avascular process identified at the left humeral head.
Morphology appears maintained.
No acute fracture, dislocation, or collapse identified.
AC joint alignment normal.
Visualized left ribs intact.
IMPRESSION: Diffuse osteosclerosis of sickle cell disease with stable
appearance of avascular necrosis changes at the left humeral head.
No acute abnormalities.

## 2013-01-29 MED ORDER — ONDANSETRON HCL 4 MG/2ML IJ SOLN: 4.0000 mg | INTRAMUSCULAR | Status: DC | PRN

## 2013-01-29 MED ORDER — KETOROLAC TROMETHAMINE 30 MG/ML IJ SOLN
30.0000 mg | Freq: Four times a day (QID) | INTRAMUSCULAR | Status: DC
  Administered 2013-01-29 (×2): 30 mg via INTRAVENOUS
  Filled 2013-01-29 (×2): qty 1

## 2013-01-29 MED ORDER — HYDROMORPHONE HCL PF 2 MG/ML IJ SOLN
2.5000 mg | Freq: Once | INTRAMUSCULAR | Status: AC
  Administered 2013-01-29: 2.5 mg via INTRAVENOUS
  Filled 2013-01-29: qty 2

## 2013-01-29 MED ORDER — RIVAROXABAN 20 MG PO TABS
20.0000 mg | ORAL_TABLET | Freq: Every morning | ORAL | Status: DC
  Administered 2013-01-29: 20 mg via ORAL
  Filled 2013-01-29: qty 1

## 2013-01-29 MED ORDER — FOLIC ACID 1 MG PO TABS
1.0000 mg | ORAL_TABLET | Freq: Every day | ORAL | Status: DC
  Administered 2013-01-29: 1 mg via ORAL
  Filled 2013-01-29: qty 1

## 2013-01-29 MED ORDER — HYDROMORPHONE HCL PF 2 MG/ML IJ SOLN
2.0000 mg | Freq: Once | INTRAMUSCULAR | Status: AC
  Administered 2013-01-29: 2 mg via INTRAVENOUS
  Filled 2013-01-29: qty 1

## 2013-01-29 MED ORDER — NALOXONE HCL 0.4 MG/ML IJ SOLN: 0.4000 mg | INTRAMUSCULAR | Status: DC | PRN

## 2013-01-29 MED ORDER — DEXTROSE-NACL 5-0.45 % IV SOLN
INTRAVENOUS | Status: DC
  Administered 2013-01-29: 09:00:00 via INTRAVENOUS

## 2013-01-29 MED ORDER — DIPHENHYDRAMINE HCL 25 MG PO CAPS: 25.0000 mg | ORAL_CAPSULE | ORAL | Status: DC | PRN

## 2013-01-29 MED ORDER — HYDROMORPHONE 0.3 MG/ML IV SOLN
INTRAVENOUS | Status: DC
Start: 2013-01-29 — End: 2013-01-29
  Administered 2013-01-29: 8.59 mg via INTRAVENOUS
  Administered 2013-01-29 (×3): via INTRAVENOUS
  Administered 2013-01-29: 13.35 mg via INTRAVENOUS
  Filled 2013-01-29 (×3): qty 25

## 2013-01-29 MED ORDER — SODIUM CHLORIDE 0.9 % IJ SOLN: 9.0000 mL | INTRAMUSCULAR | Status: DC | PRN

## 2013-01-29 MED ORDER — ONDANSETRON HCL 4 MG PO TABS: 4.0000 mg | ORAL_TABLET | ORAL | Status: DC | PRN

## 2013-01-29 MED ORDER — SODIUM CHLORIDE 0.9 % IJ SOLN
INTRAMUSCULAR | Status: AC
  Filled 2013-01-29: qty 10

## 2013-01-29 MED ORDER — HEPARIN SOD (PORK) LOCK FLUSH 100 UNIT/ML IV SOLN
500.0000 [IU] | INTRAVENOUS | Status: AC | PRN
  Administered 2013-01-29: 500 [IU]
  Filled 2013-01-29: qty 5

## 2013-01-29 MED ORDER — SODIUM CHLORIDE 0.9 % IJ SOLN
10.0000 mL | INTRAMUSCULAR | Status: AC | PRN
  Administered 2013-01-29: 10 mL

## 2013-01-29 MED ORDER — MORPHINE SULFATE ER 15 MG PO TBCR: 15.0000 mg | EXTENDED_RELEASE_TABLET | Freq: Two times a day (BID) | ORAL | Status: DC

## 2013-01-29 MED ORDER — SODIUM CHLORIDE 0.9 % IV SOLN
25.0000 mg | INTRAVENOUS | Status: DC | PRN
  Filled 2013-01-29: qty 0.5

## 2013-01-29 MED ORDER — HYDROMORPHONE HCL 4 MG PO TABS
4.0000 mg | ORAL_TABLET | Freq: Once | ORAL | Status: AC
  Administered 2013-01-29: 4 mg via ORAL
  Filled 2013-01-29: qty 1

## 2013-01-29 NOTE — H&P (Signed)
[facility] History and Physical  [name] EAV:[id number] DOB: [birth year] DOA: 01/29/2013   PCP: Taquanna Borras A., MD   Chief Complaint: Pain in back x 5 days.  HPI:Pt presents with escalating pain in back which started 5 days ago. Pt states that he was managing his pain with his usual home medications however, pain escalated this morning to the level where it was uncontrolled with oral medications. He describes pain as throbbing and 8/10 with no other associated symptoms. He has had no diarrhea, nausea or vomiting. He had chest pain earlier ion the week which is now resolved and current pain is localized to his low back.    Review of Systems:  Constitutional: No weight loss, night sweats, Fevers, chills, fatigue.  HEENT: No headaches, dizziness, seizures, vision changes, difficulty swallowing,Tooth/dental problems,Sore throat, No sneezing, itching, ear ache, nasal congestion, post nasal drip,  Cardio-vascular: No chest pain, Orthopnea, PND, swelling in lower extremities, anasarca, dizziness, palpitations  GI: No heartburn, indigestion, abdominal pain, nausea, vomiting, diarrhea, change in bowel habits, loss of appetite  Resp: No shortness of breath with exertion or at rest. No excess mucus, no productive cough, No non-productive cough, No coughing up of blood.No change in color of mucus.No wheezing.No chest wall deformity  Skin: no rash or lesions.  GU: no dysuria, change in color of urine, no urgency or frequency. No flank pain.  Psych: No change in mood or affect. No depression or anxiety. No memory loss.    Past Medical History  Diagnosis Date  . Sickle cell anemia   . Blood transfusion   . 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 Hypokalemia   . Mood disorder   . Pulmonary embolism   . Avascular necrosis   . Leukocytosis     Chronic  . Thrombocytosis     Chronic  . Hypertension   . C. difficile colitis   . Uses marijuana    . Chronic anticoagulation   . Functional asplenia    Past Surgical History  Procedure Laterality Date  . Right hip replacement      08/2006  . Cholecystectomy      01/2008  . Porta cath placement    . Porta cath removal    . Umbilical hernia repair      01/2008  . Excision of left periauricular cyst      10/2009  . Excision of right ear lobe cyst with primary closur      11/2007  . Portacath placement  01/05/2012    Procedure: INSERTION PORT-A-CATH;  Surgeon: Adolph Pollack, MD;  Location: [facility] OR;  Service: General;  Laterality: N/A;  ultrasound guiced port a cath insertion with fluoroscopy   Social History:  reports that he quit smoking about 2 years ago. His smoking use included Cigarettes. He smoked 0.00 packs per day for 13 years. He has never used smokeless tobacco. He reports that he uses illicit drugs (Marijuana). He reports that he does not drink alcohol.  Allergies  Allergen Reactions  . Morphine And Related Hives and Rash    Pt states he can take the pill form, but not IV and he is able to tolerate Dilaudid with no reactions.    Family History  Problem Relation Age of Onset  . Sickle cell anemia Mother   . Sickle cell anemia Father   . Sickle cell trait Brother     Prior to Admission medications   Medication Sig Start Date  End Date Taking? Authorizing Provider  celecoxib (CELEBREX) 200 MG capsule Take 1 capsule (200 mg total) by mouth 2 (two) times daily. 11/19/12   Altha Harm, MD  folic acid (FOLVITE) 1 MG tablet Take 1 tablet (1 mg total) by mouth every morning. 06/05/12   Altha Harm, MD  HYDROmorphone (DILAUDID) 4 MG tablet Take 1 tablet (4 mg total) by mouth every 4 (four) hours as needed for severe pain. 01/21/13   Altha Harm, MD  hydroxyurea (HYDREA) 500 MG capsule Take 500 mg by mouth 2 (two) times daily with a meal. May take with food to minimize GI side effects.    Historical Provider, MD  morphine (MS CONTIN) 15 MG 12 hr tablet  Take 1 tablet (15 mg total) by mouth 2 (two) times daily. 01/21/13   Altha Harm, MD  potassium chloride SA (K-DUR,KLOR-CON) 20 MEQ tablet Take 1 tablet (20 mEq total) by mouth every morning. 07/31/12   Altha Harm, MD  Rivaroxaban (XARELTO) 20 MG TABS tablet Take 1 tablet (20 mg total) by mouth every morning. 11/21/12   Altha Harm, MD  zolpidem (AMBIEN) 10 MG tablet Take 1 tablet (10 mg total) by mouth at bedtime as needed for sleep. 11/28/12   Grayce Sessions, NP   Physical Exam: Filed Vitals:   01/29/13 0854  BP: 129/75  Pulse: 80  Temp: 98 F (36.7 C)  TempSrc: Oral  Resp: 18  Height: 6' (1.829 m)  Weight: 172 lb (78.019 kg)  SpO2: 100%   BP 129/75  Pulse 80  Temp(Src) 98 F (36.7 C) (Oral)  Resp 18  Ht 6' (1.829 m)  Wt 172 lb (78.019 kg)  BMI 23.32 kg/m2  SpO2 100%  General Appearance:    Alert, cooperative, no distress, appears stated age  Head:    Normocephalic, without obvious abnormality, atraumatic  Eyes:    PERRL, conjunctiva/corneas clear, EOM's intact, fundi    benign, both eyes with mild icterus.    Throat:   Lips, mucosa, and tongue normal; teeth and gums normal  Neck:   Supple, symmetrical, trachea midline, no adenopathy;       thyroid:  No enlargement/tenderness/nodules; no carotid   bruit or JVD  Back:     Symmetric, no curvature, ROM normal, no CVA tenderness  Lungs:     Clear to auscultation bilaterally, respirations unlabored  Chest wall:    No tenderness or deformity  Heart:    Regular rate and rhythm, S1 and S2 normal, no murmur, rub   or gallop  Abdomen:     Soft, non-tender, bowel sounds active all four quadrants,    no masses, no organomegaly  Extremities:   Extremities normal, atraumatic, no cyanosis or edema  Pulses:   2+ and symmetric all extremities  Skin:   Skin color, texture, turgor normal, no rashes or lesions  Lymph nodes:   Cervical, supraclavicular, and axillary nodes normal  Neurologic:   CNII-XII intact.  Normal strength, sensation and reflexes      throughout    Labs on Admission:     Assessment/Plan: Active Problems:  Hb SS with crisis: Pt will be managed with IV Toradol and IV Dilaudid initially with rapid re-dosing and transition to PCA if not initially controlled. Pt will also receive IVF with hypotonic fluids.   Chronic Pain:Pt presenting with pain consistent with Sickle Cell Crisis. It is my assessment that patient a component of chronic pain which is uncontrolled and is triggering  his frequent presentations for acute management. Pt has not been    Time spend: 45 minutes Code Status: Full Code Family Communication: N/A Disposition Plan: Not yet determined.  Darryn Kydd A., MD  Pager [phone]  If 7PM-7AM, please contact night-coverage [email or web address] Password [facility] 01/29/2013, 8:56 AM

## 2013-01-29 NOTE — Telephone Encounter (Signed)
Return patient call. Instructed patient to come to the day hospital. Patient acknowledges.

## 2013-01-29 NOTE — Telephone Encounter (Signed)
Received patient call. Patient c/o side pain and back pain for couple days, rating pain 8/10. Patient denies all other pertinent ROS per [facility] Triage Assessment. Informed patient that this Rn will notify the care provider and he will receive a call back with recommendation. Patient acknowledges.

## 2013-01-29 NOTE — Progress Notes (Signed)
Patient discharged home, VSS, patient in no apparent distress. Discharge instructions reviewed, patient acknowledges. Patient reports his ride has been called. Patient left day hospital with belongings ambulatory via self.

## 2013-01-29 NOTE — Discharge Summary (Signed)
Physician Discharge Summary  [name] AVW:[id number] DOB: 1980/03/03 DOA: 01/29/2013  PCP: [name],[name], MD  Admit date: 01/29/2013 Discharge date: 01/29/2013  Discharge Diagnoses:  Active Problems: Hb S with acute crisis Chronic Pain   Discharge Condition: Good. Pain 4/10 down from 8/10  Disposition: Home  Diet: Regular  Wt Readings from Last 3 Encounters:  01/29/13 172 lb (78.019 kg)  01/21/13 172 lb 9.9 oz (78.3 kg)  01/03/13 187 lb (84.823 kg)    History of present illness:  Pt presents with escalating pain in back which started 5 days ago. Pt states that he was managing his pain with his usual home medications however, pain escalated this morning to the level where it was uncontrolled with oral medications. He describes pain as throbbing and 8/10 with no other associated symptoms. He has had no diarrhea, nausea or vomiting. He had chest pain earlier ion the week which is now resolved and current pain is localized to his low back.    Hospital Course:  1. Hb SS with crisis: Pt was managed with IV Toradol and IV Dilaudid initially with rapid re-dosing and then  transitioned to PCA . Pt will also receive IVF with hypotonic fluids. At the time of discharge pain was down to 4/10 and pt had no functional limitations in ADL.  Chronic Pain:Pt presenting with pain consistent with Sickle Cell Crisis. It is my assessment that patient a component of chronic pain which is uncontrolled and is triggering his frequent presentations for acute management. Pt has not kept his office appointments but is scheduled for an appointment on 02/06/2013.     Discharge Exam: Filed Vitals:   01/29/13 1425  BP:   Pulse:   Temp:   Resp: 15   Filed Vitals:   01/29/13 1205 01/29/13 1305 01/29/13 1405 01/29/13 1425  BP: 122/71 120/68 123/77   Pulse: 67 68 75   Temp: 98.5 F (36.9 C) 98.7 F (37.1 C) 98.7 F (37.1 C)   TempSrc: Oral Oral Oral   Resp: 16 14 15 15   Height:       Weight:      SpO2: 96% 99% 99% 97%  General: Alert, awake, oriented x3, in no acute distress.  HEENT: [place]/AT PEERL, EOMI, anicteric.  Heart: Regular rate and rhythm, without murmurs, rubs, gallops.  Lungs: Clear to auscultation, no wheezing or rhonchi noted. No pleuritic pain. Abdomen: Soft, nontender, nondistended, positive bowel sounds, no masses no hepatosplenomegaly noted.  Neuro: No focal neurological deficits noted cranial nerves II through XII grossly intact. Strength functional in bilateral upper and lower extremities.  Musculoskeletal: No warm swelling or erythema around joints, no spinal tenderness noted.  Psychiatric: Patient alert and oriented x3, good insight and cognition, good recent to remote recall.   Discharge Instructions  Discharge Orders   Future Appointments Provider Department Dept Phone   02/06/2013 10:30 AM Altha Harm, MD [place] [facility] [phone]   Future Orders Complete By Expires   Activity as tolerated - No restrictions  As directed    Diet general  As directed        Medication List         celecoxib 200 MG capsule  Commonly known as:  CELEBREX  Take 1 capsule (200 mg total) by mouth 2 (two) times daily.     folic acid 1 MG tablet  Commonly known as:  FOLVITE  Take 1 tablet (1 mg total) by mouth every morning.     HYDROmorphone  4 MG tablet  Commonly known as:  DILAUDID  Take 1 tablet (4 mg total) by mouth every 4 (four) hours as needed for severe pain.     hydroxyurea 500 MG capsule  Commonly known as:  HYDREA  Take 500 mg by mouth 2 (two) times daily with a meal. May take with food to minimize GI side effects.     morphine 15 MG 12 hr tablet  Commonly known as:  MS CONTIN  Take 1 tablet (15 mg total) by mouth 2 (two) times daily.     potassium chloride SA 20 MEQ tablet  Commonly known as:  K-DUR,KLOR-CON  Take 1 tablet (20 mEq total) by mouth every morning.     Rivaroxaban 20 MG Tabs tablet  Commonly known as:   XARELTO  Take 1 tablet (20 mg total) by mouth every morning.     zolpidem 10 MG tablet  Commonly known as:  AMBIEN  Take 1 tablet (10 mg total) by mouth at bedtime as needed for sleep.          The results of significant diagnostics from this hospitalization (including imaging, microbiology, ancillary and laboratory) are listed below for reference.    Significant Diagnostic Studies: Dg Chest 2 View  01/13/2013   CLINICAL DATA:  Chest pain.  Sickle cell crisis.  EXAM: CHEST  2 VIEW  COMPARISON:  01/03/2013, 11/13/2012, 01/14/2012.  FINDINGS: Cardiac silhouette mildly enlarged but stable. Hilar and mediastinal contours otherwise unremarkable. Prominent bronchovascular markings diffusely and mild central peribronchial thickening, unchanged. Linear scarring in the lower lobes, unchanged. Answer lungs otherwise clear left subclavian Port-A-Cath tip in the lower SVC at the cavoatrial junction.  IMPRESSION: No acute cardiopulmonary disease. Stable mild changes of chronic bronchitis and/or asthma and stable scarring in the lower lobes. Stable mild cardiomegaly.   Electronically Signed   By: Hulan Saas M.D.   On: 01/13/2013 12:08   Dg Chest 2 View  01/03/2013   CLINICAL DATA:  Sickle cell crisis.  EXAM: CHEST  2 VIEW  COMPARISON:  11/17/2012 CT  FINDINGS: Mild cardiac enlargement and central vascular prominence. Linear retrocardiac opacity. No pleural effusion or pneumothorax. Surgical clips right upper quadrant. Left chest wall Port-A-Cath with tip projecting over the mid SVC. No acute osseous finding.  IMPRESSION: Cardiomegaly and central vascular prominence, similar to prior.  Retrocardiac opacity has decreased compared to the prior and now appears linear, therefore favored to reflect atelectasis/ scarring.   Electronically Signed   By: Jearld Lesch M.D.   On: 01/03/2013 03:12    Microbiology: No results found for this or any previous visit (from the past 240 hour(s)).   Labs: Basic  Metabolic Panel:  Recent Labs Lab 01/29/13 0920  NA 142  K 3.4*  CL 107  CO2 24  GLUCOSE 102*  BUN 8  CREATININE 0.59  CALCIUM 8.7   Liver Function Tests:  Recent Labs Lab 01/29/13 0920  AST 24  ALT 16  ALKPHOS 80  BILITOT 4.2*  PROT 7.3  ALBUMIN 3.8   No results found for this basename: LIPASE, AMYLASE,  in the last 168 hours No results found for this basename: AMMONIA,  in the last 168 hours CBC:  Recent Labs Lab 01/29/13 0920  WBC 17.0*  NEUTROABS 11.4*  HGB 7.5*  HCT 22.3*  MCV 96.1  PLT 513*    Time coordinating discharge: 47 minutes  Signed:  [name],[name]  01/29/2013, 4:57 PM

## 2013-02-06 ENCOUNTER — Ambulatory Visit: Payer: Medicare Other | Admitting: Internal Medicine

## 2013-02-06 NOTE — Discharge Summary (Signed)
Physician Discharge Summary  [name] ZHY:[id number] DOB: [birth year] DOA: 01/04/2013  PCP: Dequavion Follette A., MD  Admit date: 01/04/2013 Discharge date: 02/06/2013  Discharge Diagnoses:  Active Problems: Hb SS with pain   Discharge Condition: Good  Disposition: Home  Diet: Regular  Wt Readings  01/03/13 187 lb (84.823 kg)    History of present illness:  Pt here for transfusion of 1 unit of PRBC's based on hemoglobin levels from yesterday. Pt also states that he is having pain in legs and back typical of Sickle Cell Crisis. He rates his pain as 7/10 and states that his pain escalated again this morning. He denies any N/V/D, F/C.   Hospital Course:  Pt was treated  Initially with IV Dilaudid by rapid re-dosing. His pain persisted and he was transitioned to weight based analgesic via Dilaudid PCA. His pain decreased from 8/10 to 6/10 at time of discharge. Pt was also transfused 12 Unit of PRBC's. Discharge Exam: Filed Vitals:   01/04/13 1806  BP:   Pulse:   Temp:   Resp: 14   Filed Vitals:   01/04/13 1612 01/04/13 1631 01/04/13 1731 01/04/13 1806  BP: 117/86  122/89   Pulse: 78  77   Temp: 98.7 F (37.1 C)  98.5 F (36.9 C)   TempSrc: Oral  Oral   Resp: 18 18 16 14   SpO2: 99% 96% 99% 97%  General: Alert, awake, oriented x3, in no acute distress.  HEENT: [place]/AT PEERL, EOMI. anicteric.  Heart: Regular rate and rhythm, without murmurs, rubs, gallops.  Lungs: Clear to auscultation, no wheezing or rhonchi noted. No pleuritic pain.  Abdomen: Soft, nontender, nondistended, positive bowel sounds, no masses no hepatosplenomegaly noted.  Neuro: No focal neurological deficits noted cranial nerves II through XII grossly intact. Strength functional in bilateral upper and lower extremities.  Musculoskeletal: No warm swelling or erythema around joints, no spinal tenderness noted.  Psychiatric: Patient alert and oriented x3, good insight and cognition, good recent to remote  recall. Marland Kitchen  Discharge Instructions  Discharge Orders   Future Appointments Provider Department Dept Phone   02/13/2013 3:30 PM [name], MD [place] [phone]   Future Orders Complete By Expires   Activity as tolerated - No restrictions  As directed    Diet general  As directed        Medication List         celecoxib 200 MG capsule  Commonly known as:  CELEBREX  Take 1 capsule (200 mg total) by mouth 2 (two) times daily.     folic acid 1 MG tablet  Commonly known as:  FOLVITE  Take 1 tablet (1 mg total) by mouth every morning.     potassium chloride SA 20 MEQ tablet  Commonly known as:  K-DUR,KLOR-CON  Take 1 tablet (20 mEq total) by mouth every morning.     Rivaroxaban 20 MG Tabs tablet  Commonly known as:  XARELTO  Take 1 tablet (20 mg total) by mouth every morning.     zolpidem 10 MG tablet  Commonly known as:  AMBIEN  Take 1 tablet (10 mg total) by mouth at bedtime as needed for sleep.          The results of significant diagnostics from this hospitalization (including imaging, microbiology, ancillary and laboratory) are listed below for reference.    Significant Diagnostic Studies:    Time coordinating discharge: 40 minutes  Signed:  Aneri Slagel A.  02/06/2013, 3:20 PM

## 2013-02-06 NOTE — Discharge Summary (Signed)
Physician Discharge Summary  [name] [id number]:[id number] DOB: Jun 02, 1980 DOA: 01/10/2013  PCP: [name],[name], MD  Admit date: 01/10/2013 Discharge date: 02/06/2013  Discharge Diagnoses:  Active Problems:  Hb SS with crisis   Discharge Condition: Good  Disposition: Home  Diet: Regular Wt Readings from Last 3 Encounters:  01/29/13 172 lb (78.019 kg)  01/21/13 172 lb 9.9 oz (78.3 kg)  01/03/13 187 lb (84.823 kg)    History of present illness:  Pt was seen in the day hospital last Friday 6 days ago and treated for acute sickle cell pain. He states that he was doing well until last night when he started having pain in the ribs, legs and forearm. He tried to manage with his oral medications but pain still remianed at an intensity of 8/10. He presents today for acute pain crisis which is typical of his sickle cell pain. He describes it as throbbing in nature. There are no associated symptoms. Pt does report that since he has been taking his Hydrea on a regular basis he has had some GI upset. I have advised him to take with food.   Hospital Course:  Pt with early acute crisis. He was treated initially with rapid re-dosing of IV Dilaudid and re-assessed after the1 st hour. He was started PCA at individualized dose in addition to his oral Dilaudid and this decreased his pain to 5/10 which is manageable at home. He was also treated with IVF and a dose of Toradol.    Discharge Exam: Filed Vitals:   01/10/13 1757  BP: 120/71  Pulse: 80  Temp: 99 F (37.2 C)  Resp: 17   Filed Vitals:   01/10/13 1344 01/10/13 1445 01/10/13 1631 01/10/13 1757  BP: 113/78 124/83  120/71  Pulse: 72 92  80  Temp: 98.7 F (37.1 C) 98.7 F (37.1 C)  99 F (37.2 C)  TempSrc: Oral Oral  Oral  Resp: 14 17 17 17   SpO2: 92% 96% 99% 97%  General: Alert, awake, oriented x3, in no acute distress.  HEENT: [place]/AT PEERL, EOMI. anicteric.  Heart: Regular rate and rhythm, without murmurs, rubs, gallops.   Lungs: Clear to auscultation, no wheezing or rhonchi noted. No pleuritic pain.  Abdomen: Soft, nontender, nondistended, positive bowel sounds, no masses no hepatosplenomegaly noted.  Neuro: No focal neurological deficits noted cranial nerves II through XII grossly intact. Strength functional in bilateral upper and lower extremities.  Musculoskeletal: No warm swelling or erythema around joints, no spinal tenderness noted.  Psychiatric: Patient alert and oriented x3, good insight and cognition, good recent to remote recall. Marland Kitchen  Discharge Instructions  Discharge Orders   Future Appointments Provider Department Dept Phone   02/13/2013 3:30 PM [name], MD [place] [phone]   Future Orders Complete By Expires   Activity as tolerated - No restrictions  As directed    Diet general  As directed        Medication List         celecoxib 200 MG capsule  Commonly known as:  CELEBREX  Take 1 capsule (200 mg total) by mouth 2 (two) times daily.     folic acid 1 MG tablet  Commonly known as:  FOLVITE  Take 1 tablet (1 mg total) by mouth every morning.     potassium chloride SA 20 MEQ tablet  Commonly known as:  K-DUR,KLOR-CON  Take 1 tablet (20 mEq total) by mouth every morning.     Rivaroxaban 20 MG  Tabs tablet  Commonly known as:  XARELTO  Take 1 tablet (20 mg total) by mouth every morning.     zolpidem 10 MG tablet  Commonly known as:  AMBIEN  Take 1 tablet (10 mg total) by mouth at bedtime as needed for sleep.          The results of significant diagnostics from this hospitalization (including imaging, microbiology, ancillary and laboratory) are listed below for reference.    Significant Diagnostic Studies:  Microbiology: Time coordinating discharge: 40 minutes  Signed:  [name],[name]  01/11/2013, 1:33 PM

## 2013-02-09 ENCOUNTER — Emergency Department (HOSPITAL_COMMUNITY)
Admission: EM | Admit: 2013-02-09 | Discharge: 2013-02-09 | Disposition: A | Discharge status: Home / Self Care | Payer: Medicare Other | Attending: Emergency Medicine | Admitting: Emergency Medicine

## 2013-02-09 ENCOUNTER — Emergency Department (HOSPITAL_COMMUNITY): Payer: Medicare Other

## 2013-02-09 ENCOUNTER — Encounter (HOSPITAL_COMMUNITY): Payer: Self-pay | Admitting: Emergency Medicine

## 2013-02-09 DIAGNOSIS — I10 Essential (primary) hypertension: Secondary | ICD-10-CM | POA: Insufficient documentation

## 2013-02-09 DIAGNOSIS — Z79899 Other long term (current) drug therapy: Secondary | ICD-10-CM | POA: Insufficient documentation

## 2013-02-09 DIAGNOSIS — D571 Sickle-cell disease without crisis: Secondary | ICD-10-CM | POA: Diagnosis not present

## 2013-02-09 DIAGNOSIS — D5700 Hb-SS disease with crisis, unspecified: Secondary | ICD-10-CM | POA: Insufficient documentation

## 2013-02-09 DIAGNOSIS — Z7901 Long term (current) use of anticoagulants: Secondary | ICD-10-CM | POA: Insufficient documentation

## 2013-02-09 DIAGNOSIS — M8700 Idiopathic aseptic necrosis of unspecified bone: Secondary | ICD-10-CM | POA: Insufficient documentation

## 2013-02-09 DIAGNOSIS — F39 Unspecified mood [affective] disorder: Secondary | ICD-10-CM | POA: Insufficient documentation

## 2013-02-09 DIAGNOSIS — Z87891 Personal history of nicotine dependence: Secondary | ICD-10-CM | POA: Insufficient documentation

## 2013-02-09 DIAGNOSIS — R079 Chest pain, unspecified: Secondary | ICD-10-CM | POA: Insufficient documentation

## 2013-02-09 DIAGNOSIS — F1210 Cannabis abuse, uncomplicated: Secondary | ICD-10-CM | POA: Insufficient documentation

## 2013-02-09 DIAGNOSIS — D473 Essential (hemorrhagic) thrombocythemia: Secondary | ICD-10-CM | POA: Insufficient documentation

## 2013-02-09 DIAGNOSIS — Z86718 Personal history of other venous thrombosis and embolism: Secondary | ICD-10-CM | POA: Insufficient documentation

## 2013-02-09 DIAGNOSIS — D72829 Elevated white blood cell count, unspecified: Secondary | ICD-10-CM | POA: Insufficient documentation

## 2013-02-09 DIAGNOSIS — I498 Other specified cardiac arrhythmias: Secondary | ICD-10-CM | POA: Diagnosis not present

## 2013-02-09 DIAGNOSIS — Q8909 Congenital malformations of spleen: Secondary | ICD-10-CM | POA: Insufficient documentation

## 2013-02-09 LAB — COMPREHENSIVE METABOLIC PANEL
ALBUMIN: 4.2 g/dL (ref 3.5–5.2)
ALK PHOS: 83 U/L (ref 39–117)
ALT: 16 U/L (ref 0–53)
AST: 27 U/L (ref 0–37)
BUN: 5 mg/dL — ABNORMAL LOW (ref 6–23)
CO2: 25 mEq/L (ref 19–32)
Calcium: 9.3 mg/dL (ref 8.4–10.5)
Chloride: 103 mEq/L (ref 96–112)
Creatinine, Ser: 0.53 mg/dL (ref 0.50–1.35)
GFR calc non Af Amer: >90 mL/min (ref >90)
GLUCOSE: 110 mg/dL — AB (ref 70–99)
POTASSIUM: 3.3 meq/L — AB (ref 3.7–5.3)
Sodium: 141 mEq/L (ref 137–147)
Total Bilirubin: 6.2 mg/dL — ABNORMAL HIGH (ref 0.3–1.2)
Total Protein: 7.9 g/dL (ref 6.0–8.3)

## 2013-02-09 LAB — CBC WITH DIFFERENTIAL/PLATELET
Basophils Absolute: 0.2 10*3/uL — ABNORMAL HIGH (ref 0.0–0.1)
Basophils Relative: 1 % (ref 0–1)
EOS PCT: 2 % (ref 0–5)
Eosinophils Absolute: 0.3 10*3/uL (ref 0.0–0.7)
HEMATOCRIT: 24.7 % — AB (ref 39.0–52.0)
Hemoglobin: 8.7 g/dL — ABNORMAL LOW (ref 13.0–17.0)
Lymphocytes Relative: 10 % — ABNORMAL LOW (ref 12–46)
Lymphs Abs: 1.6 10*3/uL (ref 0.7–4.0)
MCH: 32.8 pg (ref 26.0–34.0)
MCHC: 35.2 g/dL (ref 30.0–36.0)
MCV: 93.2 fL (ref 78.0–100.0)
MONOS PCT: 13 % — AB (ref 3–12)
Monocytes Absolute: 2.1 10*3/uL — ABNORMAL HIGH (ref 0.1–1.0)
NEUTROS ABS: 11.8 10*3/uL — AB (ref 1.7–7.7)
Neutrophils Relative %: 74 % (ref 43–77)
Platelets: 548 10*3/uL — ABNORMAL HIGH (ref 150–400)
RBC: 2.65 MIL/uL — AB (ref 4.22–5.81)
RDW: 18.8 % — ABNORMAL HIGH (ref 11.5–15.5)
WBC: 16 10*3/uL — AB (ref 4.0–10.5)

## 2013-02-09 LAB — RETICULOCYTES
RBC.: 2.65 MIL/uL — ABNORMAL LOW (ref 4.22–5.81)
Retic Count, Absolute: 389.6 10*3/uL — ABNORMAL HIGH (ref 19.0–186.0)
Retic Ct Pct: 14.7 % — ABNORMAL HIGH (ref 0.4–3.1)

## 2013-02-09 LAB — POCT I-STAT TROPONIN I: TROPONIN I, POC: 0 ng/mL (ref 0.00–0.08)

## 2013-02-09 MED ORDER — SODIUM CHLORIDE 0.9 % IJ SOLN
INTRAMUSCULAR | Status: AC
  Administered 2013-02-09: 10 mL
  Filled 2013-02-09: qty 10

## 2013-02-09 MED ORDER — DIPHENHYDRAMINE HCL 25 MG PO CAPS
25.0000 mg | ORAL_CAPSULE | Freq: Once | ORAL | Status: AC
  Administered 2013-02-09: 25 mg via ORAL
  Filled 2013-02-09: qty 1

## 2013-02-09 MED ORDER — SODIUM CHLORIDE 0.9 % IV BOLUS (SEPSIS)
1000.0000 mL | Freq: Once | INTRAVENOUS | Status: AC
  Administered 2013-02-09: 1000 mL via INTRAVENOUS

## 2013-02-09 MED ORDER — HYDROMORPHONE HCL PF 2 MG/ML IJ SOLN
2.0000 mg | INTRAMUSCULAR | Status: AC | PRN
  Administered 2013-02-09 (×3): 2 mg via INTRAVENOUS
  Filled 2013-02-09 (×3): qty 1

## 2013-02-09 MED ORDER — HEPARIN SOD (PORK) LOCK FLUSH 100 UNIT/ML IV SOLN
500.0000 [IU] | Freq: Once | INTRAVENOUS | Status: AC
  Administered 2013-02-09: 500 [IU]
  Filled 2013-02-09: qty 5

## 2013-02-09 MED ORDER — HYDROMORPHONE HCL PF 2 MG/ML IJ SOLN
4.0000 mg | Freq: Once | INTRAMUSCULAR | Status: AC
  Administered 2013-02-09: 4 mg via INTRAVENOUS
  Filled 2013-02-09: qty 2

## 2013-02-09 NOTE — ED Provider Notes (Signed)
CSN: [id number]     Arrival date & time 02/09/13  1010 History   First MD Initiated Contact with Patient 02/09/13 1039     Chief Complaint  Patient presents with  . Sickle Cell Pain Crisis  . Abdominal Pain  . Back Pain   (Consider location/radiation/quality/duration/timing/severity/associated sxs/prior Treatment) Patient is a 33 y.o. male presenting with sickle cell pain, abdominal pain, and back pain. The history is provided by the patient and medical records.  Sickle Cell Pain Crisis Abdominal Pain Back Pain  This is a 33 y.o. F presenting to the ED for sickle cell pain crisis x 3 days.  Pt endorses pain of bilateral ribs and low back.  No recent injuries, trauma, or falls.  Pt states sx are typical of his sickle cell pain crisis.  Pt has taken home pain meds, dilaudid and MS contin, without significant improvement.  Denies any fevers, sweats, or chills.  Denies chest pain, SOB, palpitations, dizziness, or diaphoresis.   No numbness or paresthesias of LE.  No loss of bowel or bladder control.  Pt is followed by Dr. Zigmund Daniel.  Past Medical History  Diagnosis Date  . Sickle cell anemia   . Blood transfusion   . 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 Hypokalemia   . Mood disorder   . Pulmonary embolism   . Avascular necrosis   . Leukocytosis     Chronic  . Thrombocytosis     Chronic  . Hypertension   . C. difficile colitis   . Uses marijuana   . Chronic anticoagulation   . Functional asplenia    Past Surgical History  Procedure Laterality Date  . Right hip replacement      08/2006  . Cholecystectomy      01/2008  . Porta cath placement    . Porta cath removal    . Umbilical hernia repair      01/2008  . Excision of left periauricular cyst      10/2009  . Excision of right ear lobe cyst with primary closur      11/2007  . Portacath placement  01/05/2012    Procedure: INSERTION PORT-A-CATH;  Surgeon: Odis Hollingshead, MD;  Location: [facility];   Service: General;  Laterality: N/A;  ultrasound guiced port a cath insertion with fluoroscopy   Family History  Problem Relation Age of Onset  . Sickle cell anemia Mother   . Sickle cell anemia Father   . Sickle cell trait Brother    History  Substance Use Topics  . Smoking status: Former Smoker -- 13 years    Types: Cigarettes    Quit date: 07/08/2010  . Smokeless tobacco: Never Used  . Alcohol Use: No    Review of Systems  Musculoskeletal: Positive for back pain.  All other systems reviewed and are negative.    Allergies  Morphine and related  Home Medications   Current Outpatient Rx  Name  Route  Sig  Dispense  Refill  . celecoxib (CELEBREX) 200 MG capsule   Oral   Take 1 capsule (200 mg total) by mouth 2 (two) times daily.   60 capsule   1   . folic acid (FOLVITE) 1 MG tablet   Oral   Take 1 tablet (1 mg total) by mouth every morning.   30 tablet   11   . HYDROmorphone (DILAUDID) 4 MG tablet   Oral   Take 1 tablet (4 mg total) by mouth  every 4 (four) hours as needed for severe pain.   90 tablet   0   . hydroxyurea (HYDREA) 500 MG capsule   Oral   Take 500 mg by mouth 2 (two) times daily with a meal. May take with food to minimize GI side effects.         Marland Kitchen morphine (MS CONTIN) 15 MG 12 hr tablet   Oral   Take 1 tablet (15 mg total) by mouth 2 (two) times daily.   60 tablet   0   . potassium chloride SA (K-DUR,KLOR-CON) 20 MEQ tablet   Oral   Take 1 tablet (20 mEq total) by mouth every morning.   30 tablet   3   . Rivaroxaban (XARELTO) 20 MG TABS tablet   Oral   Take 1 tablet (20 mg total) by mouth every morning.   30 tablet   2   . zolpidem (AMBIEN) 10 MG tablet   Oral   Take 1 tablet (10 mg total) by mouth at bedtime as needed for sleep.   30 tablet   2    BP 130/80  Pulse 91  Temp(Src) 98 F (36.7 C) (Oral)  Resp 18  Wt 172 lb (78.019 kg)  SpO2 98%  Physical Exam  Nursing note and vitals reviewed. Constitutional: He is  oriented to person, place, and time. He appears well-developed and well-nourished.  HENT:  Head: Normocephalic and atraumatic.  Mouth/Throat: Oropharynx is clear and moist.  Eyes: Conjunctivae and EOM are normal. Pupils are equal, round, and reactive to light.  Neck: Normal range of motion. Neck supple.  Cardiovascular: Normal rate, regular rhythm and normal heart sounds.   Pulmonary/Chest: Effort normal and breath sounds normal. No accessory muscle usage. No respiratory distress. He has no wheezes. He has no rhonchi.  Pain of bilateral ribs without noted deformities; port in left chest wall; lungs CTAB  Abdominal: Soft. Bowel sounds are normal. There is no tenderness. There is no guarding.  Musculoskeletal: Normal range of motion. He exhibits no edema.  Neurological: He is alert and oriented to person, place, and time.  Skin: Skin is warm and dry.  Psychiatric: He has a normal mood and affect.    ED Course  Procedures (including critical care time) Labs Review Labs Reviewed  CBC WITH DIFFERENTIAL - Abnormal; Notable for the following:    WBC 16.0 (*)    RBC 2.65 (*)    Hemoglobin 8.7 (*)    HCT 24.7 (*)    RDW 18.8 (*)    Platelets 548 (*)    Lymphocytes Relative 10 (*)    Monocytes Relative 13 (*)    Neutro Abs 11.8 (*)    Monocytes Absolute 2.1 (*)    Basophils Absolute 0.2 (*)    All other components within normal limits  COMPREHENSIVE METABOLIC PANEL - Abnormal; Notable for the following:    Potassium 3.3 (*)    Glucose, Bld 110 (*)    BUN 5 (*)    Total Bilirubin 6.2 (*)    All other components within normal limits  RETICULOCYTES - Abnormal; Notable for the following:    Retic Ct Pct 14.7 (*)    RBC. 2.65 (*)    Retic Count, Manual 389.6 (*)    All other components within normal limits  POCT I-STAT TROPONIN I   Imaging Review No results found.  EKG Interpretation    Date/Time:  Saturday February 09 2013 11:17:56 EST Ventricular Rate:  79 PR  Interval:  188 QRS Duration: 114 QT Interval:  399 QTC Calculation: 457 R Axis:   -52 Text Interpretation:  Sinus tachycardia Paired ventricular premature complexes Probable left atrial enlargement LAD, consider left anterior fascicular block Probable lateral infarct, old poor tracing in inferior leads No significant change since last tracing Confirmed by HARRISON  MD, Byron ([id number]) on 02/09/2013 12:16:26 PM            MDM   1. Sickle cell pain crisis    EKG NSR, no acute ischemic changes.  Trop negative.  CXR clear.  Labs reassuring, H/H stable.  Pt was given dilaudid 4mg  once followed by 2mg  Q74mins x 3 doses per Dr. Zigmund Daniel recommendations in his care plan.  Pt states his pain has improved to about a 3 and he feels he can manage it at home with his PO meds.  Pt will FU with Dr. Zigmund Daniel if problems occur.  Return precautions advised.  Larene Pickett, PA-C 02/09/13 1545

## 2013-02-09 NOTE — ED Notes (Signed)
PA at bedside.

## 2013-02-09 NOTE — Discharge Instructions (Signed)
Continue taking home pain medications as directed. Follow up with Dr. Zigmund Daniel if problems occur. Return to the ED for new or worsening symptoms.

## 2013-02-09 NOTE — ED Notes (Signed)
Pt from home c/o back pain and "side pain". He reports that this has been going on since Wednesday.

## 2013-02-09 NOTE — ED Provider Notes (Signed)
Medical screening examination/treatment/procedure(s) were performed by non-physician practitioner and as supervising physician I was immediately available for consultation/collaboration.  EKG Interpretation    Date/Time:  Saturday February 09 2013 11:17:56 EST Ventricular Rate:  79 PR Interval:  188 QRS Duration: 114 QT Interval:  399 QTC Calculation: 457 R Axis:   -52 Text Interpretation:  Sinus tachycardia Paired ventricular premature complexes Probable left atrial enlargement LAD, consider left anterior fascicular block Probable lateral infarct, old poor tracing in inferior leads No significant change since last tracing Confirmed by Ieisha Gao  MD, Dioselina Brumbaugh [phone]) on 02/09/2013 12:16:26 PM              [name] Ruthe Mannan, MD 02/09/13 [phone]

## 2013-02-13 ENCOUNTER — Encounter: Payer: Self-pay | Admitting: Internal Medicine

## 2013-02-13 ENCOUNTER — Ambulatory Visit (INDEPENDENT_AMBULATORY_CARE_PROVIDER_SITE_OTHER): Payer: Medicare Other | Admitting: Internal Medicine

## 2013-02-13 VITALS — BP 115/76 | HR 92 | Temp 98.0°F | Resp 18 | Ht 72.25 in | Wt 177.0 lb

## 2013-02-13 DIAGNOSIS — I10 Essential (primary) hypertension: Secondary | ICD-10-CM

## 2013-02-13 DIAGNOSIS — E559 Vitamin D deficiency, unspecified: Secondary | ICD-10-CM | POA: Diagnosis not present

## 2013-02-13 DIAGNOSIS — R059 Cough, unspecified: Secondary | ICD-10-CM | POA: Diagnosis not present

## 2013-02-13 DIAGNOSIS — Z86711 Personal history of pulmonary embolism: Secondary | ICD-10-CM

## 2013-02-13 DIAGNOSIS — IMO0001 Reserved for inherently not codable concepts without codable children: Secondary | ICD-10-CM | POA: Diagnosis not present

## 2013-02-13 DIAGNOSIS — D72829 Elevated white blood cell count, unspecified: Secondary | ICD-10-CM | POA: Diagnosis not present

## 2013-02-13 DIAGNOSIS — E876 Hypokalemia: Secondary | ICD-10-CM | POA: Diagnosis present

## 2013-02-13 DIAGNOSIS — D72828 Other elevated white blood cell count: Secondary | ICD-10-CM | POA: Diagnosis not present

## 2013-02-13 DIAGNOSIS — D571 Sickle-cell disease without crisis: Secondary | ICD-10-CM

## 2013-02-13 DIAGNOSIS — D638 Anemia in other chronic diseases classified elsewhere: Secondary | ICD-10-CM | POA: Diagnosis not present

## 2013-02-13 DIAGNOSIS — D5700 Hb-SS disease with crisis, unspecified: Secondary | ICD-10-CM | POA: Diagnosis not present

## 2013-02-13 DIAGNOSIS — R05 Cough: Secondary | ICD-10-CM | POA: Diagnosis not present

## 2013-02-13 DIAGNOSIS — R109 Unspecified abdominal pain: Secondary | ICD-10-CM | POA: Diagnosis not present

## 2013-02-13 DIAGNOSIS — D649 Anemia, unspecified: Secondary | ICD-10-CM | POA: Diagnosis not present

## 2013-02-13 MED ORDER — VITAMIN D 50 MCG (2000 UT) PO CAPS: 1.0000 | ORAL_CAPSULE | Freq: Every day | ORAL | Status: DC

## 2013-02-13 MED ORDER — VITAMIN E 180 MG (400 UNIT) PO CAPS: 400.0000 [IU] | ORAL_CAPSULE | Freq: Every day | ORAL | Status: DC

## 2013-02-13 NOTE — Progress Notes (Signed)
Subjective:    Patient ID: [name], male    DOB: 1980/08/09, 33 y.o.   MRN: [id number]  HPI: Pt presents today for follow-up on Hb SS. He states that he has been having only mild pain as he has been staying out of the cold. Pt states that he as been without his medication and has not filled his prescription that was issued as he had some medictaion left over. He has been unable to fill the Prescription as he has not yet received his Medicaid card and is unable to afford his analgesics.  Pt states that he has been taking ibuprofen. HE has not yet scheduled his vision examination and admits that he has not been taking his Exjade, but has been compliant with Hydrea and Folic acid.  Pt also reported that he has been binge drinking ( 5 or more drinks on the weekend) since breaking up with his girlfriend.  Pt states that he has jaw pain last week after he had to walk outside in the cold. This is now resolved. Pt also c/o nasal congestion but no rhinorrhea. It has not interfered with sleeping or ADL's.  Review of Systems  Constitutional: Negative.   HENT: Negative.   Eyes: Negative.   Respiratory: Negative.   Cardiovascular: Negative.   Gastrointestinal: Negative.   Endocrine: Negative.   Genitourinary: Negative.   Musculoskeletal: Positive for arthralgias and myalgias.  Skin: Negative.   Allergic/Immunologic: Negative.   Neurological: Negative.   Hematological: Negative.   Psychiatric/Behavioral: Negative.        Objective:   Physical Exam  Constitutional: He is oriented to person, place, and time. He appears well-developed and well-nourished. He appears distressed.  HENT:  Head: Atraumatic.  Eyes: Conjunctivae and EOM are normal. Pupils are equal, round, and reactive to light. No scleral icterus.  Neck: Normal range of motion. Neck supple.  Cardiovascular: Normal rate and regular rhythm.  Exam reveals no gallop and no friction rub.   No murmur heard. Pulmonary/Chest:  Effort normal and breath sounds normal. He has no wheezes. He has no rales. He exhibits no tenderness.  Abdominal: Soft. Bowel sounds are normal. He exhibits no mass.  Musculoskeletal: Normal range of motion.  Neurological: He is alert and oriented to person, place, and time.  Skin: Skin is warm and dry.  Psychiatric: He has a normal mood and affect. His behavior is normal. Judgment and thought content normal.          Assessment & Plan:  1. Sickle cell disease - Hb SS: Continue Hydrea 500 mg twice daily. We discussed the need for good hydration, monitoring of hydration status, avoidance of heat, cold, stress, and infection triggers. We discussed the risks and benefits of Hydrea, including bone marrow suppression, the possibility of GI upset, skin ulcers, hair thinning, and teratogenicity. The patient was reminded of the need to seek medical attention of any symptoms of bleeding, anemia, or infection. Continue folic acid 1 mg daily to prevent aplastic bone marrow crises.   2. Pulmonary evaluation - Patient denies severe recurrent wheezes, shortness of breath with exercise, or persistent cough. If these symptoms develop, pulmonary function tests with spirometry will be ordered, and if abnormal, plan on referral to Pulmonology for further evaluation.  3. Cardiac - Routine screening for pulmonary hypertension is not recommended.  4. Eye - High risk of proliferative retinopathy. Annual eye exam with retinal exam recommended to patient. Pt still has not scheduled his eye examination.  5. Immunization status -  She declines vaccines today. Yearly influenza vaccination is recommended and is up to date, as well as being up to date with Meningococcal (refused) and Pneumococcal vaccines (01/15/2012).   6. Acute and chronic painful episodes - Pt states that he as been without his medication and has not filled his prescription that was issued as he had some medictaion left over. He has been unable to fill  the Prescription as he has not yet received his Medicaid card and is unable to afford his analgesics.  Pt states that he has been taking ibuprofen. We discussed that she is to receive her Schedule II prescriptions only from [place]. He is also aware that her prescription history is available to [place] online through the [place]. Controlled substance agreement signed (Date). We reminded (Pt) that all patients receiving Schedule II narcotics must be seen for follow up every three months. We reviewed the terms of our pain agreement, including the need to keep medicines in a safe locked location away from children or pets, and the need to report excess sedation or constipation, measures to avoid constipation, and policies related to early refills and stolen prescriptions. According to the [place] [facility] program, we have reviewed details related to analgesia, adverse effects, aberrant behaviors.  7. 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 Pt states that he is not taking  Exjade as it is too cumbersome to take. Explained hemochromatosis effects and pt verbalizes understanding.  8. Vitamin D deficiency - Vitamin D 400 units daily, we encouraged him  to take it.  The above recommendations are taken from the NIH Evidence-Based Management of Sickle Cell Disease: Expert Panel Report, 20149.   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 Hypokalemia: Pt does not want to increase his potassium. Advised to increase use of potassium containing foods including banannas, orange juice, tomatoes and potatoes with skin. List provided for patient.  11. Excessive ETOH use: Pt reported that he has been binge drinking since breaking up with his girlfriend. I have counseled against ETOH use and had a significant discussion regarding side effects and effects on his organs. Pt states that he quit "cold [place]" before and feels that he can do it again.  12. HTN: BP well controlled.  RTC: 3  months Labs: CMET, CBC with diff 1 week before next visit

## 2013-02-14 DIAGNOSIS — D5700 Hb-SS disease with crisis, unspecified: Secondary | ICD-10-CM | POA: Diagnosis not present

## 2013-02-14 DIAGNOSIS — D638 Anemia in other chronic diseases classified elsewhere: Secondary | ICD-10-CM | POA: Diagnosis not present

## 2013-02-14 DIAGNOSIS — D72829 Elevated white blood cell count, unspecified: Secondary | ICD-10-CM | POA: Diagnosis not present

## 2013-02-14 DIAGNOSIS — Z86711 Personal history of pulmonary embolism: Secondary | ICD-10-CM | POA: Diagnosis not present

## 2013-02-14 DIAGNOSIS — D72828 Other elevated white blood cell count: Secondary | ICD-10-CM | POA: Diagnosis not present

## 2013-02-14 DIAGNOSIS — E876 Hypokalemia: Secondary | ICD-10-CM | POA: Diagnosis not present

## 2013-02-15 DIAGNOSIS — E876 Hypokalemia: Secondary | ICD-10-CM | POA: Diagnosis not present

## 2013-02-15 DIAGNOSIS — D72829 Elevated white blood cell count, unspecified: Secondary | ICD-10-CM | POA: Diagnosis not present

## 2013-02-15 DIAGNOSIS — D5700 Hb-SS disease with crisis, unspecified: Secondary | ICD-10-CM | POA: Diagnosis not present

## 2013-02-15 DIAGNOSIS — D638 Anemia in other chronic diseases classified elsewhere: Secondary | ICD-10-CM | POA: Diagnosis not present

## 2013-02-15 DIAGNOSIS — Z86711 Personal history of pulmonary embolism: Secondary | ICD-10-CM | POA: Diagnosis not present

## 2013-02-16 DIAGNOSIS — E876 Hypokalemia: Secondary | ICD-10-CM | POA: Diagnosis not present

## 2013-02-16 DIAGNOSIS — D72829 Elevated white blood cell count, unspecified: Secondary | ICD-10-CM | POA: Diagnosis not present

## 2013-02-16 DIAGNOSIS — D5700 Hb-SS disease with crisis, unspecified: Secondary | ICD-10-CM | POA: Diagnosis not present

## 2013-02-16 DIAGNOSIS — D638 Anemia in other chronic diseases classified elsewhere: Secondary | ICD-10-CM | POA: Diagnosis not present

## 2013-02-16 DIAGNOSIS — Z86711 Personal history of pulmonary embolism: Secondary | ICD-10-CM | POA: Diagnosis not present

## 2013-02-17 DIAGNOSIS — D5700 Hb-SS disease with crisis, unspecified: Secondary | ICD-10-CM | POA: Diagnosis not present

## 2013-02-17 DIAGNOSIS — D638 Anemia in other chronic diseases classified elsewhere: Secondary | ICD-10-CM | POA: Diagnosis not present

## 2013-02-17 DIAGNOSIS — E876 Hypokalemia: Secondary | ICD-10-CM | POA: Diagnosis not present

## 2013-02-17 DIAGNOSIS — D72829 Elevated white blood cell count, unspecified: Secondary | ICD-10-CM | POA: Diagnosis not present

## 2013-02-17 DIAGNOSIS — Z86711 Personal history of pulmonary embolism: Secondary | ICD-10-CM | POA: Diagnosis not present

## 2013-02-18 DIAGNOSIS — E876 Hypokalemia: Secondary | ICD-10-CM | POA: Diagnosis not present

## 2013-02-18 DIAGNOSIS — D638 Anemia in other chronic diseases classified elsewhere: Secondary | ICD-10-CM | POA: Diagnosis not present

## 2013-02-18 DIAGNOSIS — D72829 Elevated white blood cell count, unspecified: Secondary | ICD-10-CM | POA: Diagnosis not present

## 2013-02-18 DIAGNOSIS — Z86711 Personal history of pulmonary embolism: Secondary | ICD-10-CM | POA: Diagnosis not present

## 2013-02-18 DIAGNOSIS — D5700 Hb-SS disease with crisis, unspecified: Secondary | ICD-10-CM | POA: Diagnosis not present

## 2013-02-19 DIAGNOSIS — D638 Anemia in other chronic diseases classified elsewhere: Secondary | ICD-10-CM | POA: Diagnosis not present

## 2013-02-19 DIAGNOSIS — E876 Hypokalemia: Secondary | ICD-10-CM | POA: Diagnosis not present

## 2013-02-19 DIAGNOSIS — D72829 Elevated white blood cell count, unspecified: Secondary | ICD-10-CM | POA: Diagnosis not present

## 2013-02-19 DIAGNOSIS — Z86711 Personal history of pulmonary embolism: Secondary | ICD-10-CM | POA: Diagnosis not present

## 2013-02-19 DIAGNOSIS — D5700 Hb-SS disease with crisis, unspecified: Secondary | ICD-10-CM | POA: Diagnosis not present

## 2013-02-20 ENCOUNTER — Telehealth: Payer: Self-pay | Admitting: *Deleted

## 2013-02-20 NOTE — Telephone Encounter (Addendum)
Referral for eye exam with Dr. Bea Laura  04/04/13 @ 1:15 pm will [facility] will mail new patient packet [phone] pt aware

## 2013-02-21 ENCOUNTER — Telehealth (HOSPITAL_COMMUNITY): Payer: Self-pay | Admitting: Hematology

## 2013-02-21 ENCOUNTER — Encounter (HOSPITAL_COMMUNITY): Payer: Self-pay | Admitting: *Deleted

## 2013-02-21 ENCOUNTER — Telehealth (HOSPITAL_COMMUNITY): Payer: Self-pay | Admitting: *Deleted

## 2013-02-21 ENCOUNTER — Non-Acute Institutional Stay (HOSPITAL_COMMUNITY)
Admission: AD | Admit: 2013-02-21 | Discharge: 2013-02-21 | Disposition: A | Discharge status: Home / Self Care | Payer: Medicare Other | Attending: Internal Medicine | Admitting: Internal Medicine

## 2013-02-21 DIAGNOSIS — I10 Essential (primary) hypertension: Secondary | ICD-10-CM | POA: Diagnosis not present

## 2013-02-21 DIAGNOSIS — Z79899 Other long term (current) drug therapy: Secondary | ICD-10-CM | POA: Insufficient documentation

## 2013-02-21 DIAGNOSIS — Z87891 Personal history of nicotine dependence: Secondary | ICD-10-CM | POA: Diagnosis not present

## 2013-02-21 DIAGNOSIS — D473 Essential (hemorrhagic) thrombocythemia: Secondary | ICD-10-CM | POA: Diagnosis not present

## 2013-02-21 DIAGNOSIS — D5700 Hb-SS disease with crisis, unspecified: Secondary | ICD-10-CM | POA: Diagnosis not present

## 2013-02-21 DIAGNOSIS — Z86718 Personal history of other venous thrombosis and embolism: Secondary | ICD-10-CM | POA: Insufficient documentation

## 2013-02-21 DIAGNOSIS — D72829 Elevated white blood cell count, unspecified: Secondary | ICD-10-CM | POA: Diagnosis not present

## 2013-02-21 DIAGNOSIS — Q8909 Congenital malformations of spleen: Secondary | ICD-10-CM | POA: Diagnosis not present

## 2013-02-21 DIAGNOSIS — Z7901 Long term (current) use of anticoagulants: Secondary | ICD-10-CM | POA: Insufficient documentation

## 2013-02-21 DIAGNOSIS — Z86711 Personal history of pulmonary embolism: Secondary | ICD-10-CM | POA: Insufficient documentation

## 2013-02-21 DIAGNOSIS — D571 Sickle-cell disease without crisis: Secondary | ICD-10-CM

## 2013-02-21 LAB — COMPREHENSIVE METABOLIC PANEL
ALBUMIN: 4.4 g/dL (ref 3.5–5.2)
ALK PHOS: 83 U/L (ref 39–117)
ALT: 20 U/L (ref 0–53)
AST: 37 U/L (ref 0–37)
BILIRUBIN TOTAL: 4.3 mg/dL — AB (ref 0.3–1.2)
BUN: 13 mg/dL (ref 6–23)
CO2: 19 mEq/L (ref 19–32)
CREATININE: 0.79 mg/dL (ref 0.50–1.35)
Calcium: 9.1 mg/dL (ref 8.4–10.5)
Chloride: 103 mEq/L (ref 96–112)
GFR calc Af Amer: >90 mL/min (ref >90)
GFR calc non Af Amer: >90 mL/min (ref >90)
Glucose, Bld: 104 mg/dL — ABNORMAL HIGH (ref 70–99)
Potassium: 3.3 mEq/L — ABNORMAL LOW (ref 3.7–5.3)
Sodium: 137 mEq/L (ref 137–147)
Total Protein: 8.4 g/dL — ABNORMAL HIGH (ref 6.0–8.3)

## 2013-02-21 LAB — CBC WITH DIFFERENTIAL/PLATELET
Basophils Absolute: 0.1 10*3/uL (ref 0.0–0.1)
Basophils Relative: 1 % (ref 0–1)
Eosinophils Absolute: 0.2 10*3/uL (ref 0.0–0.7)
Eosinophils Relative: 1 % (ref 0–5)
HCT: 24.7 % — ABNORMAL LOW (ref 39.0–52.0)
HEMOGLOBIN: 8.4 g/dL — AB (ref 13.0–17.0)
LYMPHS ABS: 1.5 10*3/uL (ref 0.7–4.0)
Lymphocytes Relative: 10 % — ABNORMAL LOW (ref 12–46)
MCH: 30.7 pg (ref 26.0–34.0)
MCHC: 34 g/dL (ref 30.0–36.0)
MCV: 90.1 fL (ref 78.0–100.0)
Monocytes Absolute: 1.5 10*3/uL — ABNORMAL HIGH (ref 0.1–1.0)
Monocytes Relative: 11 % (ref 3–12)
NEUTROS PCT: 77 % (ref 43–77)
Neutro Abs: 11.2 10*3/uL — ABNORMAL HIGH (ref 1.7–7.7)
Platelets: 604 10*3/uL — ABNORMAL HIGH (ref 150–400)
RBC: 2.74 MIL/uL — AB (ref 4.22–5.81)
RDW: 17.1 % — ABNORMAL HIGH (ref 11.5–15.5)
WBC: 14.6 10*3/uL — ABNORMAL HIGH (ref 4.0–10.5)

## 2013-02-21 LAB — RETICULOCYTES
RBC.: 2.74 MIL/uL — AB (ref 4.22–5.81)
RETIC COUNT ABSOLUTE: 120.6 10*3/uL (ref 19.0–186.0)
Retic Ct Pct: 4.4 % — ABNORMAL HIGH (ref 0.4–3.1)

## 2013-02-21 IMAGING — CR DG CHEST 2V
2 series · 2 of 2 positions shown · non-contrast
Comparison: 03/25/2010

CLINICAL DATA: Shortness of breath.  Question pneumonia.

CHEST - 2 VIEW

[w chest pa]
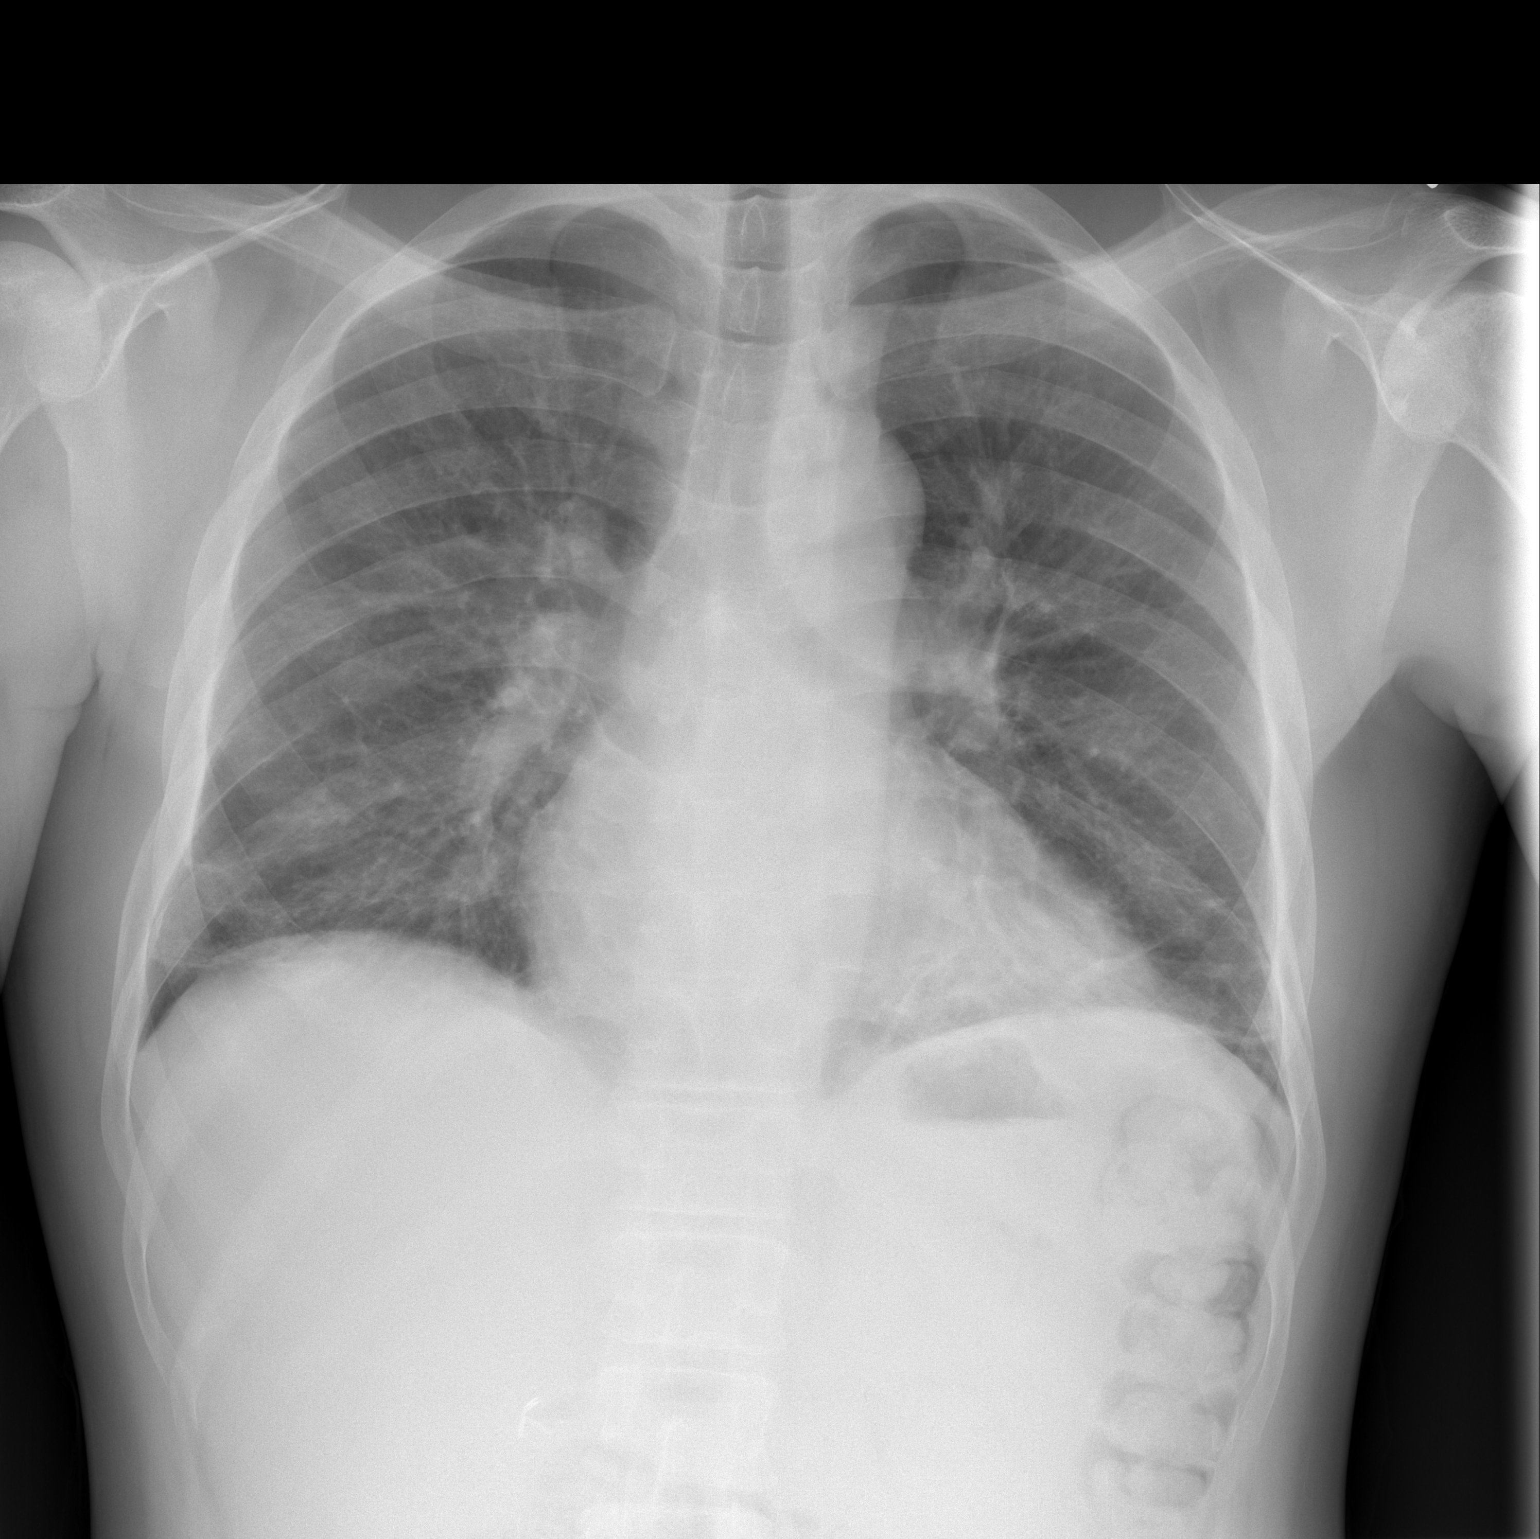

[w chest lat]
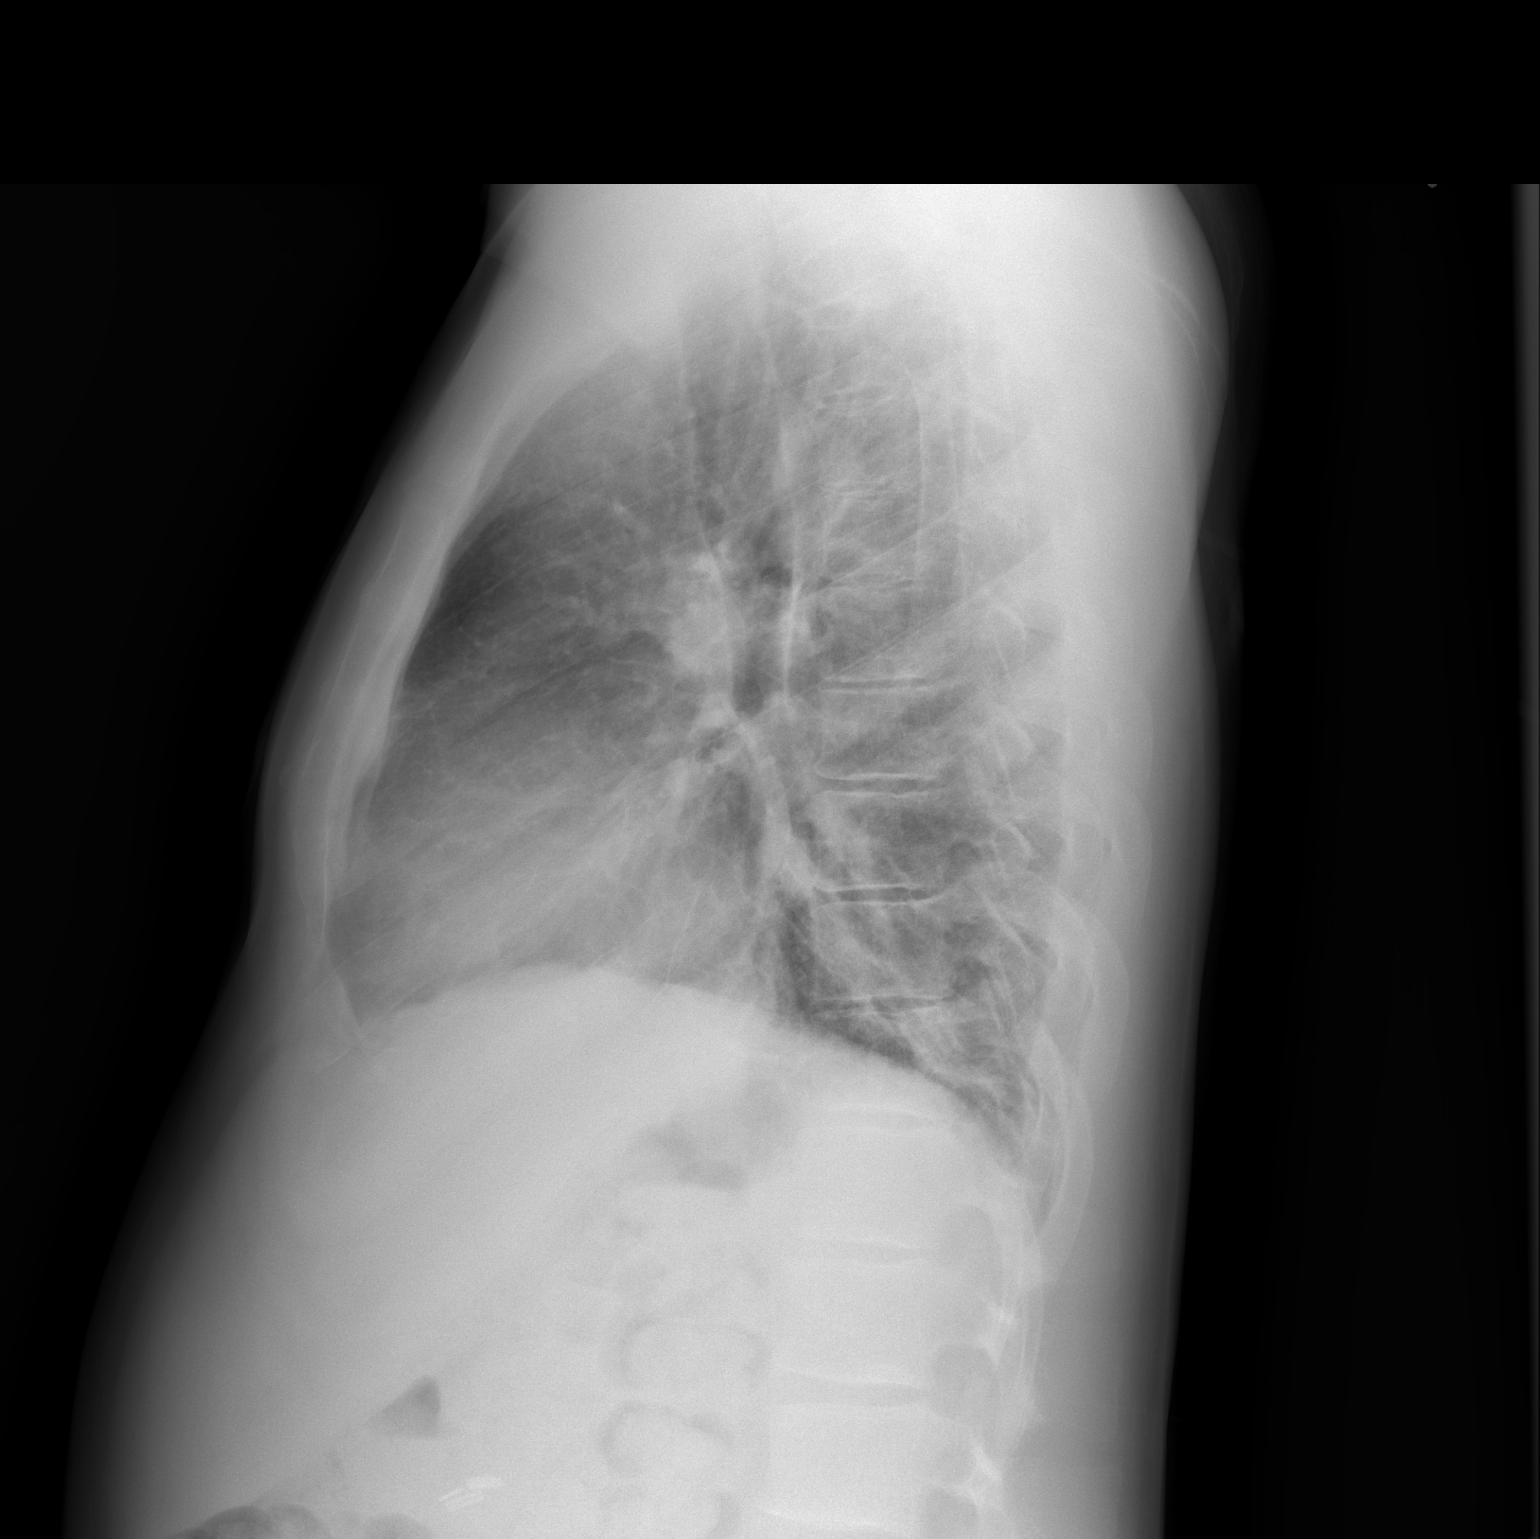

[2 of 2 positions shown; findings below may reference images not displayed]

FINDINGS: Linear densities are seen in the lung bases, likely
atelectasis.  Heart is borderline in size.  No evidence of edema or
effusions.  No acute bony abnormality.
IMPRESSION: Bibasilar atelectasis.  Borderline heart size.

## 2013-02-21 MED ORDER — HYDROMORPHONE HCL 4 MG PO TABS
4.0000 mg | ORAL_TABLET | Freq: Once | ORAL | Status: AC
  Administered 2013-02-21: 4 mg via ORAL
  Filled 2013-02-21: qty 1

## 2013-02-21 MED ORDER — NALOXONE HCL 0.4 MG/ML IJ SOLN: 0.4000 mg | INTRAMUSCULAR | Status: DC | PRN

## 2013-02-21 MED ORDER — POTASSIUM CHLORIDE CRYS ER 20 MEQ PO TBCR
20.0000 meq | EXTENDED_RELEASE_TABLET | Freq: Every morning | ORAL | Status: DC
  Administered 2013-02-21: 20 meq via ORAL
  Filled 2013-02-21: qty 1

## 2013-02-21 MED ORDER — HEPARIN SOD (PORK) LOCK FLUSH 100 UNIT/ML IV SOLN
500.0000 [IU] | INTRAVENOUS | Status: AC | PRN
  Administered 2013-02-21: 500 [IU]
  Filled 2013-02-21: qty 5

## 2013-02-21 MED ORDER — HYDROMORPHONE HCL PF 2 MG/ML IJ SOLN: 2.5000 mg | Freq: Once | INTRAMUSCULAR | Status: DC

## 2013-02-21 MED ORDER — MORPHINE SULFATE ER 15 MG PO TBCR: 15.0000 mg | EXTENDED_RELEASE_TABLET | Freq: Two times a day (BID) | ORAL | Status: DC

## 2013-02-21 MED ORDER — HYDROMORPHONE HCL PF 2 MG/ML IJ SOLN
2.0000 mg | Freq: Once | INTRAMUSCULAR | Status: AC
  Administered 2013-02-21: 2 mg via INTRAVENOUS
  Filled 2013-02-21: qty 1

## 2013-02-21 MED ORDER — ONDANSETRON HCL 4 MG/2ML IJ SOLN: 4.0000 mg | Freq: Four times a day (QID) | INTRAMUSCULAR | Status: DC | PRN

## 2013-02-21 MED ORDER — HYDROMORPHONE HCL 4 MG PO TABS: 4.0000 mg | ORAL_TABLET | ORAL | Status: DC | PRN

## 2013-02-21 MED ORDER — SODIUM CHLORIDE 0.9 % IJ SOLN
10.0000 mL | INTRAMUSCULAR | Status: AC | PRN
  Administered 2013-02-21: 10 mL

## 2013-02-21 MED ORDER — DIPHENHYDRAMINE HCL 25 MG PO CAPS: 25.0000 mg | ORAL_CAPSULE | Freq: Four times a day (QID) | ORAL | Status: DC | PRN

## 2013-02-21 MED ORDER — SODIUM CHLORIDE 0.9 % IJ SOLN: 9.0000 mL | INTRAMUSCULAR | Status: DC | PRN

## 2013-02-21 MED ORDER — MORPHINE SULFATE ER 15 MG PO TBCR
15.0000 mg | EXTENDED_RELEASE_TABLET | Freq: Two times a day (BID) | ORAL | Status: DC
  Administered 2013-02-21: 15 mg via ORAL
  Filled 2013-02-21: qty 1

## 2013-02-21 MED ORDER — HYDROMORPHONE 0.3 MG/ML IV SOLN
INTRAVENOUS | Status: DC
  Administered 2013-02-21: 4.79 mg via INTRAVENOUS
  Administered 2013-02-21: 14:00:00 via INTRAVENOUS
  Administered 2013-02-21: 7.49 mg via INTRAVENOUS
  Administered 2013-02-21 (×2): via INTRAVENOUS
  Filled 2013-02-21 (×3): qty 25

## 2013-02-21 MED ORDER — DEXTROSE-NACL 5-0.45 % IV SOLN
INTRAVENOUS | Status: DC
  Administered 2013-02-21: 09:00:00 via INTRAVENOUS

## 2013-02-21 MED ORDER — HYDROMORPHONE HCL PF 2 MG/ML IJ SOLN
3.0000 mg | Freq: Once | INTRAMUSCULAR | Status: AC
  Administered 2013-02-21: 3 mg via INTRAVENOUS
  Filled 2013-02-21: qty 2

## 2013-02-21 MED ORDER — RIVAROXABAN 20 MG PO TABS
20.0000 mg | ORAL_TABLET | Freq: Every day | ORAL | Status: DC
  Administered 2013-02-21: 20 mg via ORAL
  Filled 2013-02-21 (×2): qty 1

## 2013-02-21 MED ORDER — FOLIC ACID 1 MG PO TABS
1.0000 mg | ORAL_TABLET | Freq: Every morning | ORAL | Status: DC
  Administered 2013-02-21: 1 mg via ORAL
  Filled 2013-02-21: qty 1

## 2013-02-21 MED ORDER — HYDROMORPHONE HCL PF 2 MG/ML IJ SOLN
2.5000 mg | Freq: Once | INTRAMUSCULAR | Status: AC
  Administered 2013-02-21: 2.5 mg via INTRAVENOUS
  Filled 2013-02-21: qty 2

## 2013-02-21 NOTE — Telephone Encounter (Signed)
This RN spoke with Pharmacist Doug at the patient's pharmacy. Pharmacist looked up patient's Medicare Part D information in the system. Pharmacist states the patient can get his prescriptions filled and does not need to physically have the insurance card there. Notified patient that he can get his prescriptions filled at his pharmacy and does not need to have the insurance card; the pharmacy has the needed information to file with Medicare. Instructed patient to take his card to the pharmacy when it arrives in the mail so that the pharmacy can get a copy. Patient acknowledges. NP notified. MD notified.

## 2013-02-21 NOTE — Discharge Instructions (Signed)
Sickle Cell Anemia, Adult °Sickle cell anemia is a condition in which red blood cells have an abnormal "sickle" shape. This abnormal shape shortens the cells' life span, which results in a lower than normal concentration of red blood cells in the blood. The sickle shape also causes the cells to clump together and block free blood flow through the blood vessels. As a result, the tissues and organs of the body do not receive enough oxygen. Sickle cell anemia causes organ damage and pain and increases the risk of infection. °CAUSES  °Sickle cell anemia is a genetic disorder. Those who receive two copies of the gene have the condition, and those who receive one copy have the trait. °RISK FACTORS °The sickle cell gene is most common in people whose families originated in [place]. Other areas of the globe where sickle cell trait occurs include the Mediterranean, [place] and [place], the [place], and the [place].  °SIGNS AND SYMPTOMS °· Pain, especially in the extremities, back, chest, or abdomen (common). The pain may start suddenly or may develop following an illness, especially if there is dehydration. Pain can also occur due to overexertion or exposure to extreme temperature changes. °· Frequent severe bacterial infections, especially certain types of pneumonia and meningitis. °· Pain and swelling in the hands and feet. °· Decreased activity.   °· Loss of appetite.   °· Change in behavior. °· Headaches. °· Seizures. °· Shortness of breath or difficulty breathing. °· Vision changes. °· Skin ulcers. °Those with the trait may not have symptoms or they may have mild symptoms.  °DIAGNOSIS  °Sickle cell anemia is diagnosed with blood tests that demonstrate the genetic trait. It is often diagnosed during the newborn period, due to mandatory testing nationwide. A variety of blood tests, X-rays, CT scans, MRI scans, ultrasounds, and lung function tests may also be done to monitor the condition. °TREATMENT  °Sickle  cell anemia may be treated with: °· Medicines. You may be given pain medicines, antibiotic medicines (to treat and prevent infections) or medicines to increase the production of certain types of hemoglobin. °· Fluids. °· Oxygen. °· Blood transfusions. °HOME CARE INSTRUCTIONS  °· Drink enough fluid to keep your urine clear or pale yellow. Increase your fluid intake in hot weather and during exercise. °· Do not smoke. Smoking lowers oxygen levels in the blood.   °· Only take over-the-counter or prescription medicines for pain, fever, or discomfort as directed by your health care provider. °· Take antibiotics as directed by your health care provider. Make sure you finish them it even if you start to feel better.   °· Take supplements as directed by your health care provider.   °· Consider wearing a medical alert bracelet. This tells anyone caring for you in an emergency of your condition.   °· When traveling, keep your medical information, health care provider's names, and the medicines you take with you at all times.   °· If you develop a fever, do not take medicines to reduce the fever right away. This could cover up a problem that is developing. Notify your health care provider. °· Keep all follow-up appointments with your health care provider. Sickle cell anemia requires regular medical care. °SEEK MEDICAL CARE IF: ° You have a fever. °SEEK IMMEDIATE MEDICAL CARE IF:  °· You feel dizzy or faint.   °· You have new abdominal pain, especially on the left side near the stomach area.   °· You develop a persistent, often uncomfortable and painful penile erection (priapism). If this is not treated immediately it   will lead to impotence.   °· You have numbness your arms or legs or you have a hard time moving them.   °· You have a hard time with speech.   °· You have a fever or persistent symptoms for more than 2 3 days.   °· You have a fever and your symptoms suddenly get worse.   °· You have signs or symptoms of infection.  These include:   °· Chills.   °· Abnormal tiredness (lethargy).   °· Irritability.   °· Poor eating.   °· Vomiting.   °· You develop pain that is not helped with medicine.   °· You develop shortness of breath. °· You have pain in your chest.   °· You are coughing up pus-like or bloody sputum.   °· You develop a stiff neck. °· Your feet or hands swell or have pain. °· Your abdomen appears bloated. °· You develop joint pain. °MAKE SURE YOU: °· Understand these instructions. °· Will watch your child's condition. °· Will get help right away if your child is not doing well or gets worse. °Document Released: 04/27/2005 Document Revised: 11/07/2012 Document Reviewed: 08/29/2012 °[employer]® Patient Information ©2014 [employer]. ° °

## 2013-02-21 NOTE — Progress Notes (Deleted)
Patient ID: [name], male   DOB: [birth year], 33 y.o.   MRN: [id number] [facility]  Procedure Note  [name] DOB: 1980/11/20 DOA: 02/21/2013   PCP: [name],Gerald A., MD   Associated Diagnosis: Migraine with status migrainosus    Procedure Note: Patient c/o Headache 5/10, given IV toradol x1 and 1 tab vicodin, patient report pain improved rating 4/10. MD reevaluated patient, orders to d/c patient home.    Condition During Procedure: Tolerated well   Condition at Discharge: Tolerated well, No complaints. Patient in no apparent distress. Patient left day hospital ambulatory via self with belongings.    Wendie Simmer, RN  [facility]

## 2013-02-21 NOTE — Telephone Encounter (Signed)
Called patient back and advised that I spoke with Dr. Zigmund Daniel , and it is ok for patient to come to day hospital.  Patient verbalizes understanding.

## 2013-02-21 NOTE — Progress Notes (Signed)
Patient states he needs prescriptions for all his pain medications because he washed his last pain prescriptions. [place] NP notified and acknowledged.

## 2013-02-21 NOTE — Progress Notes (Signed)
Patient ID: LI FRAGOSO, male   DOB: July 11, 1980, 33 y.o.   MRN: [id number] Discharge instructions given to patient.  Port flushed and de accessed per protocol.  Prescriptions given to patient.  Patient re-educated that the Pharmacist was called and he should be able to fill the prescriptions today. Patient verbalizes understanding.  Follow up appointment given to patient as well for Dr. Nehemiah Massed. Patient states that pain level is currently 3-4/10.  Questions answered.  Patient ambulatory and stable at time of discharge.

## 2013-02-21 NOTE — Telephone Encounter (Signed)
Patient called in C/O pain to both sides, that he is rating 7-8/10.  Patient states this pain has been in his sides for several days.  Patient states he doesn't have anymore pain medication because he needed a new insurance card before he could get the prescription filled.  I explained that I would notify the physician and give him a call back.  Patient verbalizes understanding.

## 2013-02-25 ENCOUNTER — Non-Acute Institutional Stay (HOSPITAL_COMMUNITY)
Admission: EM | Admit: 2013-02-25 | Discharge: 2013-02-25 | Disposition: A | Discharge status: Home / Self Care | Payer: Medicare Other | Attending: Emergency Medicine | Admitting: Emergency Medicine

## 2013-02-25 ENCOUNTER — Encounter (HOSPITAL_COMMUNITY): Payer: Self-pay | Admitting: Emergency Medicine

## 2013-02-25 DIAGNOSIS — Z86711 Personal history of pulmonary embolism: Secondary | ICD-10-CM | POA: Insufficient documentation

## 2013-02-25 DIAGNOSIS — Z86718 Personal history of other venous thrombosis and embolism: Secondary | ICD-10-CM | POA: Insufficient documentation

## 2013-02-25 DIAGNOSIS — I10 Essential (primary) hypertension: Secondary | ICD-10-CM | POA: Insufficient documentation

## 2013-02-25 DIAGNOSIS — Z79899 Other long term (current) drug therapy: Secondary | ICD-10-CM | POA: Insufficient documentation

## 2013-02-25 DIAGNOSIS — Z87891 Personal history of nicotine dependence: Secondary | ICD-10-CM | POA: Diagnosis not present

## 2013-02-25 DIAGNOSIS — D5700 Hb-SS disease with crisis, unspecified: Secondary | ICD-10-CM | POA: Diagnosis not present

## 2013-02-25 DIAGNOSIS — Z96649 Presence of unspecified artificial hip joint: Secondary | ICD-10-CM | POA: Insufficient documentation

## 2013-02-25 LAB — COMPREHENSIVE METABOLIC PANEL
ALBUMIN: 4.3 g/dL (ref 3.5–5.2)
ALT: 15 U/L (ref 0–53)
AST: 21 U/L (ref 0–37)
Alkaline Phosphatase: 83 U/L (ref 39–117)
BUN: 7 mg/dL (ref 6–23)
CALCIUM: 9 mg/dL (ref 8.4–10.5)
CO2: 22 mEq/L (ref 19–32)
CREATININE: 0.56 mg/dL (ref 0.50–1.35)
Chloride: 105 mEq/L (ref 96–112)
GFR calc Af Amer: >90 mL/min (ref >90)
GFR calc non Af Amer: >90 mL/min (ref >90)
Glucose, Bld: 95 mg/dL (ref 70–99)
Potassium: 3.1 mEq/L — ABNORMAL LOW (ref 3.7–5.3)
Sodium: 142 mEq/L (ref 137–147)
TOTAL PROTEIN: 8.1 g/dL (ref 6.0–8.3)
Total Bilirubin: 4 mg/dL — ABNORMAL HIGH (ref 0.3–1.2)

## 2013-02-25 LAB — RETICULOCYTES
RBC.: 2.62 MIL/uL — AB (ref 4.22–5.81)
RETIC CT PCT: 15.4 % — AB (ref 0.4–3.1)
Retic Count, Absolute: 403.5 10*3/uL — ABNORMAL HIGH (ref 19.0–186.0)

## 2013-02-25 LAB — CBC
HEMATOCRIT: 24.2 % — AB (ref 39.0–52.0)
Hemoglobin: 8.2 g/dL — ABNORMAL LOW (ref 13.0–17.0)
MCH: 31.2 pg (ref 26.0–34.0)
MCHC: 33.9 g/dL (ref 30.0–36.0)
MCV: 92 fL (ref 78.0–100.0)
Platelets: 720 10*3/uL — ABNORMAL HIGH (ref 150–400)
RBC: 2.63 MIL/uL — ABNORMAL LOW (ref 4.22–5.81)
RDW: 18.8 % — AB (ref 11.5–15.5)
WBC: 19.4 10*3/uL — ABNORMAL HIGH (ref 4.0–10.5)

## 2013-02-25 MED ORDER — ONDANSETRON HCL 4 MG PO TABS: 4.0000 mg | ORAL_TABLET | ORAL | Status: DC | PRN

## 2013-02-25 MED ORDER — DIPHENHYDRAMINE HCL 50 MG/ML IJ SOLN: 12.5000 mg | Freq: Four times a day (QID) | INTRAMUSCULAR | Status: DC | PRN

## 2013-02-25 MED ORDER — NALOXONE HCL 0.4 MG/ML IJ SOLN: 0.4000 mg | INTRAMUSCULAR | Status: DC | PRN

## 2013-02-25 MED ORDER — DIPHENHYDRAMINE HCL 50 MG/ML IJ SOLN
25.0000 mg | Freq: Once | INTRAMUSCULAR | Status: AC
  Administered 2013-02-25: 25 mg via INTRAVENOUS
  Filled 2013-02-25: qty 1

## 2013-02-25 MED ORDER — SODIUM CHLORIDE 0.9 % IJ SOLN: 9.0000 mL | INTRAMUSCULAR | Status: DC | PRN

## 2013-02-25 MED ORDER — DIPHENHYDRAMINE HCL 25 MG PO CAPS
25.0000 mg | ORAL_CAPSULE | ORAL | Status: DC | PRN
  Administered 2013-02-25: 25 mg via ORAL
  Filled 2013-02-25: qty 1

## 2013-02-25 MED ORDER — HEPARIN SOD (PORK) LOCK FLUSH 100 UNIT/ML IV SOLN
500.0000 [IU] | INTRAVENOUS | Status: DC | PRN
  Filled 2013-02-25: qty 5

## 2013-02-25 MED ORDER — DIPHENHYDRAMINE HCL 12.5 MG/5ML PO ELIX
12.5000 mg | ORAL_SOLUTION | Freq: Four times a day (QID) | ORAL | Status: DC | PRN
  Filled 2013-02-25: qty 5

## 2013-02-25 MED ORDER — SODIUM CHLORIDE 0.9 % IV SOLN
25.0000 mg | INTRAVENOUS | Status: DC | PRN
  Filled 2013-02-25: qty 0.5

## 2013-02-25 MED ORDER — ONDANSETRON HCL 4 MG/2ML IJ SOLN: 4.0000 mg | Freq: Four times a day (QID) | INTRAMUSCULAR | Status: DC | PRN

## 2013-02-25 MED ORDER — DEXTROSE-NACL 5-0.45 % IV SOLN
INTRAVENOUS | Status: DC
  Administered 2013-02-25: 10:00:00 via INTRAVENOUS

## 2013-02-25 MED ORDER — FOLIC ACID 1 MG PO TABS
1.0000 mg | ORAL_TABLET | Freq: Every day | ORAL | Status: DC
  Administered 2013-02-25: 1 mg via ORAL
  Filled 2013-02-25: qty 1

## 2013-02-25 MED ORDER — HYDROMORPHONE 0.3 MG/ML IV SOLN
INTRAVENOUS | Status: DC
  Administered 2013-02-25: 14.65 mg via INTRAVENOUS
  Administered 2013-02-25 (×4): via INTRAVENOUS
  Filled 2013-02-25 (×4): qty 25

## 2013-02-25 MED ORDER — HYDROMORPHONE HCL PF 2 MG/ML IJ SOLN
2.0000 mg | Freq: Once | INTRAMUSCULAR | Status: AC
  Administered 2013-02-25: 2 mg via INTRAVENOUS
  Filled 2013-02-25: qty 1

## 2013-02-25 MED ORDER — POTASSIUM CHLORIDE CRYS ER 20 MEQ PO TBCR
40.0000 meq | EXTENDED_RELEASE_TABLET | Freq: Once | ORAL | Status: AC
  Administered 2013-02-25: 40 meq via ORAL
  Filled 2013-02-25: qty 2

## 2013-02-25 MED ORDER — SODIUM CHLORIDE 0.9 % IJ SOLN: 10.0000 mL | INTRAMUSCULAR | Status: DC | PRN

## 2013-02-25 MED ORDER — ONDANSETRON HCL 4 MG/2ML IJ SOLN: 4.0000 mg | INTRAMUSCULAR | Status: DC | PRN

## 2013-02-25 MED ORDER — KETOROLAC TROMETHAMINE 30 MG/ML IJ SOLN
30.0000 mg | Freq: Four times a day (QID) | INTRAMUSCULAR | Status: DC
  Administered 2013-02-25 (×2): 30 mg via INTRAVENOUS
  Filled 2013-02-25 (×2): qty 1

## 2013-02-25 NOTE — H&P (Signed)
[facility] Physical  [name]:[id number] DOB: 04-20-1980 DOA: 02/21/2013   PCP: Shaunie Boehm A., MD   Chief Complaint: Pain in back and side x 2 days.  HPI:  Pt states that he has been without any pain medications due to not having his insurance card. He had MS Contin until yesterday and since he ran out of medications he has been in pain localized to his back and right side. He rates pain as 8/10, throbbing and non-radiating in nature. He denies any F/C/N/V/D. He has no associated symptoms and characterizes pain as that of pain of sickle cell crisis.   Review of Systems:  Constitutional: No weight loss, night sweats, Fevers, chills, fatigue.  HEENT: No headaches, dizziness, seizures, vision changes, difficulty swallowing,Tooth/dental problems,Sore throat, No sneezing, itching, ear ache, nasal congestion, post nasal drip,  Cardio-vascular: No chest pain, Orthopnea, PND, swelling in lower extremities, anasarca, dizziness, palpitations  GI: No heartburn, indigestion, abdominal pain, nausea, vomiting, diarrhea, change in bowel habits, loss of appetite  Resp: No shortness of breath with exertion or at rest. No excess mucus, no productive cough, No non-productive cough, No coughing up of blood.No change in color of mucus.No wheezing.No chest wall deformity  Skin: no rash or lesions.  GU: no dysuria, change in color of urine, no urgency or frequency. No flank pain.  Psych: No change in mood or affect. No depression or anxiety. No memory loss.    Past Medical History  Diagnosis Date  . Sickle cell anemia   . Blood transfusion   . 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 Hypokalemia   . Mood disorder   . Pulmonary embolism   . Avascular necrosis   . Leukocytosis     Chronic  . Thrombocytosis     Chronic  . Hypertension   . C. difficile colitis   . Uses marijuana   . Chronic anticoagulation   . Functional asplenia     Past Surgical History  Procedure Laterality Date  . Right hip replacement      08/2006  . Cholecystectomy      01/2008  . Porta cath placement    . Porta cath removal    . Umbilical hernia repair      01/2008  . Excision of left periauricular cyst      10/2009  . Excision of right ear lobe cyst with primary closur      11/2007  . Portacath placement  01/05/2012    Procedure: INSERTION PORT-A-CATH;  Surgeon: Odis Hollingshead, MD;  Location: [facility];  Service: General;  Laterality: N/A;  ultrasound guiced port a cath insertion with fluoroscopy   Social History:  reports that he quit smoking about 2 years ago. His smoking use included Cigarettes. He smoked 0.00 packs per day for 13 years. He has never used smokeless tobacco. He reports that he uses illicit drugs (Marijuana). He reports that he does not drink alcohol.  Allergies  Allergen Reactions  . Morphine And Related Hives and Rash    Pt states he can take PO, but not IV and he is able to tolerate Dilaudid with no reactions.    Family History  Problem Relation Age of Onset  . Sickle cell anemia Mother   . Sickle cell anemia Father   . Sickle cell trait Brother     Prior to Admission medications   Medication Sig Start Date End Date Taking? Authorizing Provider  folic acid (  FOLVITE) 1 MG tablet Take 1 tablet (1 mg total) by mouth every morning. 06/05/12  Yes [name], MD  hydroxyurea (HYDREA) 500 MG capsule Take 500 mg by mouth 2 (two) times daily with a meal. May take with food to minimize GI side effects.   Yes Historical Provider, MD  potassium chloride SA (K-DUR,KLOR-CON) 20 MEQ tablet Take 1 tablet (20 mEq total) by mouth every morning. 07/31/12  Yes [name], MD  Rivaroxaban (XARELTO) 20 MG TABS tablet Take 1 tablet (20 mg total) by mouth every morning. 11/21/12  Yes [name], MD  zolpidem (AMBIEN) 10 MG tablet Take 1 tablet (10 mg total) by mouth at bedtime as needed for sleep. 11/28/12  Yes  Kerin Perna, NP  celecoxib (CELEBREX) 200 MG capsule Take 1 capsule (200 mg total) by mouth 2 (two) times daily. 11/19/12   [name], MD   Physical Exam: Filed Vitals:   02/21/13 1411 02/21/13 1511 02/21/13 1624 02/21/13 1808  BP: 126/76 124/76    Pulse: 99 92    Temp: 97.6 F (36.4 C) 97.8 F (36.6 C)    TempSrc: Oral Oral    Resp: 20 20 21 20   SpO2: 95% 95% 100%    BP 124/76  Pulse 92  Temp(Src) 97.8 F (36.6 C) (Oral)  Resp 20  SpO2 100%  General Appearance:    Alert, cooperative, mild distress, appears stated age  Head:    Normocephalic, without obvious abnormality, atraumatic  Eyes:    PERRL, conjunctiva/corneas clear, EOM's intact, fundi    benign, both eyes anicteric     Throat:   Lips, mucosa, and tongue normal; teeth and gums normal  Neck:   Supple, symmetrical, trachea midline, no adenopathy;       thyroid:  No enlargement/tenderness/nodules; no carotid   bruit or JVD  Back:     Symmetric, no curvature, ROM normal, no CVA tenderness  Lungs:     Clear to auscultation bilaterally, respirations unlabored  Chest wall:    No tenderness or deformity  Heart:    Regular rate and rhythm, S1 and S2 normal, no murmur, rub   or gallop  Abdomen:     Soft, non-tender, bowel sounds active all four quadrants,    no masses, no organomegaly  Extremities:   Extremities normal, atraumatic, no cyanosis or edema  Pulses:   2+ and symmetric all extremities  Skin:   Skin color, texture, turgor normal, no rashes or lesions  Lymph nodes:   Cervical, supraclavicular, and axillary nodes normal  Neurologic:   CNII-XII intact. Normal strength, sensation and reflexes      throughout    Labs on Admission:   Basic Metabolic Panel:  Recent Labs Lab 02/21/13 0912 02/25/13 0655  NA 137 142  K 3.3* 3.1*  CL 103 105  CO2 19 22  GLUCOSE 104* 95  BUN 13 7  CREATININE 0.79 0.56  CALCIUM 9.1 9.0   Liver Function Tests:  Recent Labs Lab 02/21/13 0912  AST 37  ALT  20  ALKPHOS 83  BILITOT 4.3*  PROT 8.4*  ALBUMIN 4.4   CBC:  Recent Labs Lab 02/21/13 0912  WBC 14.6*  NEUTROABS 11.2*  HGB 8.4*  HCT 24.7*  MCV 90.1  PLT 604*     Assessment/Plan: Active Problems: 1.Hb SS with crisis: Pt will treated with IVF, Toradol and IV analgesics. Pt will initially be treated with weight based rapid re-doing and then transitioned to a weight  based Dilaudid PCA if pain still >/= to 7/10. Will re-assess for disposition.   Time spend: 35 minutes Code Status: Full Code Family Communication: N/A Disposition Plan: Not yet determined  Shannyn Jankowiak A., MD  Pager [phone]  If 7PM-7AM, please contact night-coverage [email or web address] Password [facility] 02/25/2013, 2:32 PM

## 2013-02-25 NOTE — H&P (Signed)
[name] is an 33 y.o. male.   Chief Complaint: Generalized pain in the limbs HPI: Patient is here complaining of pain in his lower back his limbs as well as his chest. He came to the sickle cell the hospital with these complaints. He denied any fever, no nausea vomiting or diarrhea. He has been taking his medications at home but is now working. His pain has always been in the chest area on the right side. History of occasional area. He has prior history of pulmonary embolism and has been taking his medications. During his last hospitalization he had pain in the same area and he was extensively worked up. It appears this is a manifestation of his sickle cell crisis.  Past Medical History  Diagnosis Date  . Sickle cell anemia   . Blood transfusion   . 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 Hypokalemia   . Mood disorder   . Pulmonary embolism   . Avascular necrosis   . Leukocytosis     Chronic  . Thrombocytosis     Chronic  . Hypertension   . C. difficile colitis   . Uses marijuana   . Chronic anticoagulation   . Functional asplenia     Past Surgical History  Procedure Laterality Date  . Right hip replacement      08/2006  . Cholecystectomy      01/2008  . Porta cath placement    . Porta cath removal    . Umbilical hernia repair      01/2008  . Excision of left periauricular cyst      10/2009  . Excision of right ear lobe cyst with primary closur      11/2007  . Portacath placement  01/05/2012    Procedure: INSERTION PORT-A-CATH;  Surgeon: Odis Hollingshead, MD;  Location: [facility];  Service: General;  Laterality: N/A;  ultrasound guiced port a cath insertion with fluoroscopy    Family History  Problem Relation Age of Onset  . Sickle cell anemia Mother   . Sickle cell anemia Father   . Sickle cell trait Brother    Social History:  reports that he quit smoking about 2 years ago. His smoking use included Cigarettes. He smoked 0.00 packs per day  for 13 years. He has never used smokeless tobacco. He reports that he uses illicit drugs (Marijuana). He reports that he does not drink alcohol.  Allergies:  Allergies  Allergen Reactions  . Morphine And Related Hives and Rash    Pt states he can take PO, but not IV and he is able to tolerate Dilaudid with no reactions.    Medications Prior to Admission  Medication Sig Dispense Refill  . celecoxib (CELEBREX) 200 MG capsule Take 1 capsule (200 mg total) by mouth 2 (two) times daily.  60 capsule  1  . folic acid (FOLVITE) 1 MG tablet Take 1 tablet (1 mg total) by mouth every morning.  30 tablet  11  . HYDROmorphone (DILAUDID) 4 MG tablet Take 1 tablet (4 mg total) by mouth every 4 (four) hours as needed for severe pain.  90 tablet  0  . hydroxyurea (HYDREA) 500 MG capsule Take 500 mg by mouth 2 (two) times daily with a meal. May take with food to minimize GI side effects.      Marland Kitchen morphine (MS CONTIN) 15 MG 12 hr tablet Take 1 tablet (15 mg total) by mouth 2 (two) times daily.  60 tablet  0  . potassium chloride SA (K-DUR,KLOR-CON) 20 MEQ tablet Take 1 tablet (20 mEq total) by mouth every morning.  30 tablet  3  . Rivaroxaban (XARELTO) 20 MG TABS tablet Take 1 tablet (20 mg total) by mouth every morning.  30 tablet  2  . zolpidem (AMBIEN) 10 MG tablet Take 1 tablet (10 mg total) by mouth at bedtime as needed for sleep.  30 tablet  2    Results for orders placed during the hospital encounter of 02/25/13 (from the past 48 hour(s))  CBC     Status: Abnormal   Collection Time    02/25/13  6:55 AM      Result Value Range   WBC 19.4 (*) 4.0 - 10.5 K/uL   RBC 2.63 (*) 4.22 - 5.81 MIL/uL   Hemoglobin 8.2 (*) 13.0 - 17.0 g/dL   HCT 00.8 (*) 89.0 - 13.8 %   MCV 92.0  78.0 - 100.0 fL   MCH 31.2  26.0 - 34.0 pg   MCHC 33.9  30.0 - 36.0 g/dL   RDW 55.2 (*) 89.4 - 24.0 %   Platelets 720 (*) 150 - 400 K/uL  COMPREHENSIVE METABOLIC PANEL     Status: Abnormal   Collection Time    02/25/13  6:55 AM       Result Value Range   Sodium 142  137 - 147 mEq/L   Potassium 3.1 (*) 3.7 - 5.3 mEq/L   Chloride 105  96 - 112 mEq/L   CO2 22  19 - 32 mEq/L   Glucose, Bld 95  70 - 99 mg/dL   BUN 7  6 - 23 mg/dL   Creatinine, Ser 6.42  0.50 - 1.35 mg/dL   Calcium 9.0  8.4 - 94.2 mg/dL   Total Protein 8.1  6.0 - 8.3 g/dL   Albumin 4.3  3.5 - 5.2 g/dL   AST 21  0 - 37 U/L   ALT 15  0 - 53 U/L   Alkaline Phosphatase 83  39 - 117 U/L   Total Bilirubin 4.0 (*) 0.3 - 1.2 mg/dL   GFR calc non Af Amer >90  >90 mL/min   GFR calc Af Amer >90  >90 mL/min   Comment: (NOTE)     The eGFR has been calculated using the CKD EPI equation.     This calculation has not been validated in all clinical situations.     eGFR's persistently <90 mL/min signify possible Chronic Kidney     Disease.   No results found.  Review of Systems  Constitutional: Negative.   Eyes: Negative.   Respiratory: Negative.   Cardiovascular: Negative.   Gastrointestinal: Negative.   Genitourinary: Negative.   Musculoskeletal: Positive for back pain, joint pain, myalgias and neck pain.  Skin: Negative.   Neurological: Positive for headaches. Negative for focal weakness.  Endo/Heme/Allergies: Negative.   Psychiatric/Behavioral: Negative.     Blood pressure 122/72, pulse 75, temperature 98.5 F (36.9 C), temperature source Oral, resp. rate 16, height 6' (1.829 m), weight 78.019 kg (172 lb), SpO2 98.00%. Physical Exam  Constitutional: He is oriented to person, place, and time. He appears well-developed and well-nourished.  HENT:  Head: Normocephalic and atraumatic.  Right Ear: External ear normal.  Left Ear: External ear normal.  Mouth/Throat: Oropharynx is clear and moist.  Eyes: Conjunctivae and EOM are normal. Pupils are equal, round, and reactive to light.  Neck: Normal range of motion. Neck supple.  Cardiovascular: Normal rate,  regular rhythm, normal heart sounds and intact distal pulses.   Respiratory: Effort normal and  breath sounds normal.  GI: Soft. Bowel sounds are normal.  Musculoskeletal: Normal range of motion. He exhibits tenderness.  Neurological: He is alert and oriented to person, place, and time. He has normal reflexes.  Skin: Skin is warm.  Psychiatric: He has a normal mood and affect.     Assessment/Plan A 33 year old gentleman here with sickle cell painful crisis affecting his rib cage.  #1 sickle cell painful crisis: Patient will be admitted in the day hospital for pain control. We will start him on Dilaudid PCA, Toradol, Benadryl, and IV hydration. We will follow his symptoms closely until resolved.  #2 history of pulmonary embolism: We'll continue with his anticoagulation.  #3 hypertension: His blood pressure seems well controlled. Patient will be maintained on his home medications.  Adreona Brand,[name] 02/25/2013, 10:00 AM

## 2013-02-25 NOTE — Progress Notes (Addendum)
Pt arrived to room 2, escorted by ED staff. Pt a&ox3, ambulatory, pt in no distress. Pt c/o back pain 8/10 on pain scale. Pt assisted to bed, call bell in reach, bed in lowest position. Will continue to monitor pt hourly.

## 2013-02-25 NOTE — ED Notes (Signed)
Pt reports sickle cell type pain x 3 days, 8/10 generalized aching in nature.  Home pain medications are not adequately controlling pain.

## 2013-02-25 NOTE — ED Provider Notes (Signed)
CSN: [id number]     Arrival date & time 02/25/13  0401 History   First MD Initiated Contact with Patient 02/25/13 [phone]     Chief Complaint  Patient presents with  . Sickle Cell Pain Crisis   (Consider location/radiation/quality/duration/timing/severity/associated sxs/prior Treatment) HPI Complains of bilateral flank pain typical of sickle cell crisis onset 2-3 days ago. Denies leg pain denies headache denies chest pain denies shortness of breath denies cough denies fever treated with dilaudid at home without adequate pain relief. Nothing makes symptoms better or worse pain is severe, constant Past Medical History  Diagnosis Date  . Sickle cell anemia   . Blood transfusion   . 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 Hypokalemia   . Mood disorder   . Pulmonary embolism   . Avascular necrosis   . Leukocytosis     Chronic  . Thrombocytosis     Chronic  . Hypertension   . C. difficile colitis   . Uses marijuana   . Chronic anticoagulation   . Functional asplenia    Past Surgical History  Procedure Laterality Date  . Right hip replacement      08/2006  . Cholecystectomy      01/2008  . Porta cath placement    . Porta cath removal    . Umbilical hernia repair      01/2008  . Excision of left periauricular cyst      10/2009  . Excision of right ear lobe cyst with primary closur      11/2007  . Portacath placement  01/05/2012    Procedure: INSERTION PORT-A-CATH;  Surgeon: Odis Hollingshead, MD;  Location: [facility];  Service: General;  Laterality: N/A;  ultrasound guiced port a cath insertion with fluoroscopy   Family History  Problem Relation Age of Onset  . Sickle cell anemia Mother   . Sickle cell anemia Father   . Sickle cell trait Brother    History  Substance Use Topics  . Smoking status: Former Smoker -- 13 years    Types: Cigarettes    Quit date: 07/08/2010  . Smokeless tobacco: Never Used  . Alcohol Use: No    Review of Systems   Constitutional: Negative.   HENT: Negative.   Respiratory: Negative.   Cardiovascular: Negative.   Gastrointestinal: Negative.   Genitourinary: Positive for flank pain.  Musculoskeletal: Negative.   Skin: Negative.   Neurological: Negative.   Psychiatric/Behavioral: Negative.   All other systems reviewed and are negative.    Allergies  Morphine and related  Home Medications   Current Outpatient Rx  Name  Route  Sig  Dispense  Refill  . celecoxib (CELEBREX) 200 MG capsule   Oral   Take 1 capsule (200 mg total) by mouth 2 (two) times daily.   60 capsule   1   . folic acid (FOLVITE) 1 MG tablet   Oral   Take 1 tablet (1 mg total) by mouth every morning.   30 tablet   11   . HYDROmorphone (DILAUDID) 4 MG tablet   Oral   Take 1 tablet (4 mg total) by mouth every 4 (four) hours as needed for severe pain.   90 tablet   0   . hydroxyurea (HYDREA) 500 MG capsule   Oral   Take 500 mg by mouth 2 (two) times daily with a meal. May take with food to minimize GI side effects.         Marland Kitchen  morphine (MS CONTIN) 15 MG 12 hr tablet   Oral   Take 1 tablet (15 mg total) by mouth 2 (two) times daily.   60 tablet   0   . potassium chloride SA (K-DUR,KLOR-CON) 20 MEQ tablet   Oral   Take 1 tablet (20 mEq total) by mouth every morning.   30 tablet   3   . Rivaroxaban (XARELTO) 20 MG TABS tablet   Oral   Take 1 tablet (20 mg total) by mouth every morning.   30 tablet   2   . zolpidem (AMBIEN) 10 MG tablet   Oral   Take 1 tablet (10 mg total) by mouth at bedtime as needed for sleep.   30 tablet   2    BP 116/74  Pulse 74  Temp(Src) 98.5 F (36.9 C) (Oral)  Resp 18  Ht 6' (1.829 m)  Wt 172 lb (78.019 kg)  BMI 23.32 kg/m2  SpO2 97% Physical Exam  Nursing note and vitals reviewed. Constitutional: He is oriented to person, place, and time. He appears well-developed and well-nourished. No distress.  HENT:  Head: Normocephalic and atraumatic.  Eyes: Conjunctivae are  normal. Pupils are equal, round, and reactive to light. Scleral icterus is present.  Neck: Neck supple. No tracheal deviation present. No thyromegaly present.  Cardiovascular: Normal rate and regular rhythm.   No murmur heard. Pulmonary/Chest: Effort normal and breath sounds normal.  Abdominal: Soft. Bowel sounds are normal. He exhibits no distension. There is no tenderness.  Musculoskeletal: Normal range of motion. He exhibits no edema and no tenderness.  Entire spine nontender.  Neurological: He is alert and oriented to person, place, and time. Coordination normal.  All 4 extremities no redness or tenderness neurovascularly intact  Skin: Skin is warm and dry. No rash noted.  Psychiatric: He has a normal mood and affect.    ED Course  Procedures (including critical care time) Labs Review Labs Reviewed  CBC   Imaging Review No results found.  EKG Interpretation   None      7:53 AM pain improved after intravenous opioids 8:50 AM patient states pain is a 5 or 6 on scale of 1-10 after 3 doses of intravenous hydromorphone. And received potassium 40 mEq by mouth Results for orders placed during the hospital encounter of 02/25/13  CBC      Result Value Range   WBC 19.4 (*) 4.0 - 10.5 K/uL   RBC 2.63 (*) 4.22 - 5.81 MIL/uL   Hemoglobin 8.2 (*) 13.0 - 17.0 g/dL   HCT 24.2 (*) 39.0 - 52.0 %   MCV 92.0  78.0 - 100.0 fL   MCH 31.2  26.0 - 34.0 pg   MCHC 33.9  30.0 - 36.0 g/dL   RDW 18.8 (*) 11.5 - 15.5 %   Platelets 720 (*) 150 - 400 K/uL  COMPREHENSIVE METABOLIC PANEL      Result Value Range   Sodium 142  137 - 147 mEq/L   Potassium 3.1 (*) 3.7 - 5.3 mEq/L   Chloride 105  96 - 112 mEq/L   CO2 22  19 - 32 mEq/L   Glucose, Bld 95  70 - 99 mg/dL   BUN 7  6 - 23 mg/dL   Creatinine, Ser 0.56  0.50 - 1.35 mg/dL   Calcium 9.0  8.4 - 10.5 mg/dL   Total Protein 8.1  6.0 - 8.3 g/dL   Albumin 4.3  3.5 - 5.2 g/dL   AST 21  0 -  37 U/L   ALT 15  0 - 53 U/L   Alkaline Phosphatase 83   39 - 117 U/L   Total Bilirubin 4.0 (*) 0.3 - 1.2 mg/dL   GFR calc non Af Amer >90  >90 mL/min   GFR calc Af Amer >90  >90 mL/min   Dg Chest 2 View  02/09/2013   CLINICAL DATA:  Chest pain, sickle cell crisis  EXAM: CHEST  2 VIEW  COMPARISON:  01/13/2013  FINDINGS: Left subclavian power port catheter tip at the SVC RA junction. Stable mild cardiomegaly with vascular and interstitial prominence appearing chronic. No superimposed CHF or pneumonia. Mild bibasilar streaky scarring. No effusion or pneumothorax. Trachea midline. Prior cholecystectomy noted.  IMPRESSION: Stable cardiomegaly with interstitial prominence and parenchymal scarring. No superimposed acute process   Electronically Signed   By: Daryll Brod M.D.   On: 02/09/2013 11:07    MDM  No diagnosis found. Spoke with Dr. Zigmund Daniel, who will see him in the sickle cell clinic immediately upon discharge from her for pain control Diagnosis #1 sickle cell crisis #2 hypokalemia    Orlie Dakin, MD 02/25/13 [phone]

## 2013-02-25 NOTE — Progress Notes (Signed)
Patient ID: [name], male   DOB: November 04, 1980, 33 y.o.   MRN: [id number]  Pt discharged to home with brother. Pt a&ox4 at time of discharge. portacath flushed, packed with heparin, and deaccessed; pt tolerated well. Discharge instruction given, pt verbalized understanding, copy given. Pt ambulatory at time of discharge.

## 2013-02-25 NOTE — ED Notes (Signed)
Pt presents with c/o sickle cell pain to his legs and back  Pt states the pain is in his legs and back

## 2013-02-25 NOTE — Discharge Instructions (Signed)
Sickle Cell Anemia, Adult Go directly to the sickle cell clinic upon leaving here.  Sickle cell anemia is a condition where your red blood cells are shaped like sickles. Red blood cells carry oxygen through the body. Sickle-shaped red blood cells cells do not live as long as normal red blood cells. They also clump together and block blood from flowing through the blood vessels. These things prevent the body from getting enough oxygen. Sickle cell anemia causes organ damage and pain. It also increases the risk of infection. HOME CARE  Drink enough fluid to keep your pee (urine) clear or pale yellow. Drink more in hot weather and during exercise.  Do not smoke. Smoking lowers oxygen levels in the blood.  Only take over-the-counter or prescription medicines as told by your doctor.  Take antibiotic medicines as told by your doctor. Make sure you finish them it even if you start to feel better.  Take supplements as told by your doctor.  Consider wearing a medical alert bracelet. This tells anyone caring for you in an emergency of your condition.  When traveling, keep your medical information, doctors' names, and the medicines you take with you at all times.  If you have a fever, do not take fever medicines right away. This could cover up a problem. Tell your doctor.   Keep all follow-up appointments with your doctor. Sickle cell anemia requires regular medical care. GET HELP IF: You have a fever. GET HELP RIGHT AWAY IF:  You feel dizzy or faint.  You have new belly (abdominal) pain, especially on the left side near the stomach area.  You have a lasting, often uncomfortable and painful erection of the penis (priapism). If it is not treated right away, you will become unable to have sex (impotence).  You have numbness your arms or legs or you have a hard time moving them.  You have a hard time talking.  You have a fever or lasting symptoms for more than 2 3 days.  You have a fever and  your symptoms suddenly get worse.  You have signs or symptoms of infection. These include:  Chills.  Being more tired than normal (lethargy).  Irritability.  Poor eating.  Throwing up (vomiting).  You have pain that is not helped with medicine.  You have shortness of breath.  You have pain in your chest.  You are coughing up pus-like or bloody mucus.  You have a stiff neck.  Your feet or hands swell or have pain.  Your belly looks bloated.  Your joints hurt. MAKE SURE YOU:  Understand these instructions.  Will watch your condition.  Will get help right away if you are not doing well or get worse. Document Released: 11/07/2012 Document Reviewed: 08/29/2012 [facility] Patient Information 2014 [place].

## 2013-02-27 NOTE — Discharge Summary (Signed)
Physician Discharge Summary  [name] OHY:[id number] DOB: 07/06/1980 DOA: 02/21/2013  PCP: [name],[name], MD  Admit date: 02/21/2013 Discharge date: 02/27/2013  Discharge Diagnoses:   Hb SS with crisis  Discharge Condition: stable  Disposition: Home  Diet:Regular Wt Readings from Last 3 Encounters:  02/25/13 172 lb (78.019 kg)  02/13/13 177 lb (80.287 kg)  02/09/13 172 lb (78.019 kg)    History of present illness:  HPI:  Pt states that he has been without any pain medications due to not having his insurance card. He had MS Contin until yesterday and since he ran out of medications he has been in pain localized to his back and right side. He rates pain as 8/10, throbbing and non-radiating in nature. He denies any F/C/N/V/D. He has no associated symptoms and characterizes pain as that of pain of sickle cell crisis.     Hospital Course: A 33 year old gentleman admitted with sickle cell painful crisis and known history of chronic pain. 1.Patient was treated with IV Dilaudid, IV fluids, as well as supportive care. Patient was placed on Dilaudid PCA, which he tolerated very.  We gradually restarted oral medications and patient was at baseline pain score 5/10.  Patient was subsequently discharged home on his previously prescribed home medications.  To follow up at [facility] on 05/15/2013 at 10 am.     Review of Systems:  Constitutional: No weight loss, night sweats, Fevers, chills, fatigue.  HEENT: No headaches, dizziness, seizures, vision changes, difficulty swallowing,Tooth/dental problems,Sore throat, No sneezing, itching, ear ache, nasal congestion, post nasal drip,  Cardio-vascular: No chest pain, Orthopnea, PND, swelling in lower extremities, anasarca, dizziness, palpitations  GI: No heartburn, indigestion, abdominal pain, nausea, vomiting, diarrhea, change in bowel habits, loss of appetite  Resp: No shortness of breath with exertion or at  rest. No excess mucus, no productive cough, No non-productive cough, No coughing up of blood.No change in color of mucus.No wheezing.No chest wall deformity  Skin: no rash or lesions.  GU: no dysuria, change in color of urine, no urgency or frequency. No flank pain.  Psych: No change in mood or affect. No depression or anxiety. No memory loss.    Discharge Exam:  Filed Vitals:   02/21/13 1808  BP:   Pulse:   Temp:   Resp: 20   Filed Vitals:   02/21/13 1411 02/21/13 1511 02/21/13 1624 02/21/13 1808  BP: 126/76 124/76    Pulse: 99 92    Temp: 97.6 F (36.4 C) 97.8 F (36.6 C)    TempSrc: Oral Oral    Resp: 20 20 21 20   SpO2: 95% 95% 100%     Discharge Instructions  Discharge Orders   Future Appointments Provider Department Dept Phone   05/15/2013 10:00 AM Dorena Dew, [place] [phone]   Future Orders Complete By Expires   Activity as tolerated - No restrictions  As directed    Diet general  As directed        Medication List         celecoxib 200 MG capsule  Commonly known as:  CELEBREX  Take 1 capsule (200 mg total) by mouth 2 (two) times daily.     folic acid 1 MG tablet  Commonly known as:  FOLVITE  Take 1 tablet (1 mg total) by mouth every morning.     HYDROmorphone 4 MG tablet  Commonly known as:  DILAUDID  Take 1 tablet (4 mg total)  by mouth every 4 (four) hours as needed for severe pain.     hydroxyurea 500 MG capsule  Commonly known as:  HYDREA  Take 500 mg by mouth 2 (two) times daily with a meal. May take with food to minimize GI side effects.     morphine 15 MG 12 hr tablet  Commonly known as:  MS CONTIN  Take 1 tablet (15 mg total) by mouth 2 (two) times daily.     potassium chloride SA 20 MEQ tablet  Commonly known as:  K-DUR,KLOR-CON  Take 1 tablet (20 mEq total) by mouth every morning.     Rivaroxaban 20 MG Tabs tablet  Commonly known as:  XARELTO  Take 1 tablet (20 mg total) by mouth every morning.      zolpidem 10 MG tablet  Commonly known as:  AMBIEN  Take 1 tablet (10 mg total) by mouth at bedtime as needed for sleep.        Labs: Basic Metabolic Panel:  Recent Labs Lab 02/21/13 0912 02/25/13 0655  NA 137 142  K 3.3* 3.1*  CL 103 105  CO2 19 22  GLUCOSE 104* 95  BUN 13 7  CREATININE 0.79 0.56  CALCIUM 9.1 9.0   Liver Function Tests:  Recent Labs Lab 02/21/13 0912 02/25/13 0655  AST 37 21  ALT 20 15  ALKPHOS 83 83  BILITOT 4.3* 4.0*  PROT 8.4* 8.1  ALBUMIN 4.4 4.3   CBC:  Recent Labs Lab 02/21/13 0912 02/25/13 0655  WBC 14.6* 19.4*  NEUTROABS 11.2*  --   HGB 8.4* 8.2*  HCT 24.7* 24.2*  MCV 90.1 92.0  PLT 604* 720*   Time coordinating discharge: 45 min  Signed:  Gargi Berch M  02/27/2013, 12:33 PM Clerical error: discharge occurred on 02/21/2013 and signed on 02/27/2013. Addendum on 03/22/2013

## 2013-03-03 ENCOUNTER — Emergency Department (HOSPITAL_COMMUNITY)
Admission: EM | Admit: 2013-03-03 | Discharge: 2013-03-03 | Disposition: A | Discharge status: Home / Self Care | Payer: Medicare Other | Attending: Emergency Medicine | Admitting: Emergency Medicine

## 2013-03-03 ENCOUNTER — Encounter (HOSPITAL_COMMUNITY): Payer: Self-pay | Admitting: Emergency Medicine

## 2013-03-03 DIAGNOSIS — Z8619 Personal history of other infectious and parasitic diseases: Secondary | ICD-10-CM | POA: Insufficient documentation

## 2013-03-03 DIAGNOSIS — D571 Sickle-cell disease without crisis: Secondary | ICD-10-CM | POA: Diagnosis not present

## 2013-03-03 DIAGNOSIS — Z86711 Personal history of pulmonary embolism: Secondary | ICD-10-CM | POA: Diagnosis not present

## 2013-03-03 DIAGNOSIS — Z791 Long term (current) use of non-steroidal anti-inflammatories (NSAID): Secondary | ICD-10-CM | POA: Insufficient documentation

## 2013-03-03 DIAGNOSIS — Q8909 Congenital malformations of spleen: Secondary | ICD-10-CM | POA: Insufficient documentation

## 2013-03-03 DIAGNOSIS — M545 Low back pain, unspecified: Secondary | ICD-10-CM | POA: Diagnosis not present

## 2013-03-03 DIAGNOSIS — Z79899 Other long term (current) drug therapy: Secondary | ICD-10-CM | POA: Insufficient documentation

## 2013-03-03 DIAGNOSIS — D5700 Hb-SS disease with crisis, unspecified: Secondary | ICD-10-CM | POA: Diagnosis not present

## 2013-03-03 DIAGNOSIS — Z8659 Personal history of other mental and behavioral disorders: Secondary | ICD-10-CM | POA: Diagnosis not present

## 2013-03-03 DIAGNOSIS — Z87891 Personal history of nicotine dependence: Secondary | ICD-10-CM | POA: Diagnosis not present

## 2013-03-03 DIAGNOSIS — I10 Essential (primary) hypertension: Secondary | ICD-10-CM | POA: Diagnosis not present

## 2013-03-03 DIAGNOSIS — Z7901 Long term (current) use of anticoagulants: Secondary | ICD-10-CM | POA: Insufficient documentation

## 2013-03-03 DIAGNOSIS — E876 Hypokalemia: Secondary | ICD-10-CM | POA: Diagnosis not present

## 2013-03-03 LAB — CBC WITH DIFFERENTIAL/PLATELET
BASOS ABS: 0.1 10*3/uL (ref 0.0–0.1)
Basophils Relative: 1 % (ref 0–1)
EOS ABS: 0.4 10*3/uL (ref 0.0–0.7)
Eosinophils Relative: 2 % (ref 0–5)
HCT: 25 % — ABNORMAL LOW (ref 39.0–52.0)
Hemoglobin: 8.7 g/dL — ABNORMAL LOW (ref 13.0–17.0)
Lymphocytes Relative: 13 % (ref 12–46)
Lymphs Abs: 2.2 10*3/uL (ref 0.7–4.0)
MCH: 32.1 pg (ref 26.0–34.0)
MCHC: 34.8 g/dL (ref 30.0–36.0)
MCV: 92.3 fL (ref 78.0–100.0)
Monocytes Absolute: 2.3 10*3/uL — ABNORMAL HIGH (ref 0.1–1.0)
Monocytes Relative: 14 % — ABNORMAL HIGH (ref 3–12)
Neutro Abs: 11.5 10*3/uL — ABNORMAL HIGH (ref 1.7–7.7)
Neutrophils Relative %: 70 % (ref 43–77)
PLATELETS: 565 10*3/uL — AB (ref 150–400)
RBC: 2.71 MIL/uL — ABNORMAL LOW (ref 4.22–5.81)
RDW: 17.8 % — AB (ref 11.5–15.5)
WBC: 16.5 10*3/uL — AB (ref 4.0–10.5)

## 2013-03-03 LAB — BASIC METABOLIC PANEL
BUN: 5 mg/dL — ABNORMAL LOW (ref 6–23)
CALCIUM: 8.3 mg/dL — AB (ref 8.4–10.5)
CO2: 23 mEq/L (ref 19–32)
Chloride: 105 mEq/L (ref 96–112)
Creatinine, Ser: 0.58 mg/dL (ref 0.50–1.35)
Glucose, Bld: 115 mg/dL — ABNORMAL HIGH (ref 70–99)
Potassium: 3.4 mEq/L — ABNORMAL LOW (ref 3.7–5.3)
SODIUM: 140 meq/L (ref 137–147)

## 2013-03-03 LAB — RETICULOCYTES
RBC.: 2.71 MIL/uL — AB (ref 4.22–5.81)
RETIC CT PCT: 11.2 % — AB (ref 0.4–3.1)
Retic Count, Absolute: 303.5 10*3/uL — ABNORMAL HIGH (ref 19.0–186.0)

## 2013-03-03 MED ORDER — HYDROMORPHONE HCL PF 1 MG/ML IJ SOLN
1.0000 mg | Freq: Once | INTRAMUSCULAR | Status: AC
  Administered 2013-03-03: 1 mg via INTRAVENOUS
  Filled 2013-03-03: qty 1

## 2013-03-03 MED ORDER — SODIUM CHLORIDE 0.9 % IJ SOLN
INTRAMUSCULAR | Status: AC
  Filled 2013-03-03: qty 10

## 2013-03-03 MED ORDER — SODIUM CHLORIDE 0.9 % IV BOLUS (SEPSIS)
1000.0000 mL | Freq: Once | INTRAVENOUS | Status: AC
  Administered 2013-03-03: 1000 mL via INTRAVENOUS

## 2013-03-03 MED ORDER — HEPARIN SOD (PORK) LOCK FLUSH 100 UNIT/ML IV SOLN
500.0000 [IU] | Freq: Once | INTRAVENOUS | Status: AC
  Administered 2013-03-03: 500 [IU]
  Filled 2013-03-03: qty 5

## 2013-03-03 NOTE — ED Notes (Signed)
Pt from home c/o SCC that started 2 days ago. Pt having pain in back and ribs. Pt denies SOB. Pt is A&O and in NAD

## 2013-03-03 NOTE — ED Provider Notes (Signed)
CSN: [id number]     Arrival date & time 03/03/13  9622 History   First MD Initiated Contact with Patient 03/03/13 [phone]     Chief Complaint  Patient presents with  . Sickle Cell Pain Crisis   (Consider location/radiation/quality/duration/timing/severity/associated sxs/prior Treatment) The history is provided by the patient.  [name] is a 33 y.o. male hx of sickle cell anemia, hypokalemia here with L back pain and flank pain. He was discharged home 2 days ago from the sickle cell clinic for sickle cell crisis. He left and then subsequently had worsening L sided back and flank pain. No chest pain or shortness of breath. No vomiting or fevers. No history of kidney stones.   Past Medical History  Diagnosis Date  . Sickle cell anemia   . Blood transfusion   . 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 Hypokalemia   . Mood disorder   . Pulmonary embolism   . Avascular necrosis   . Leukocytosis     Chronic  . Thrombocytosis     Chronic  . Hypertension   . C. difficile colitis   . Uses marijuana   . Chronic anticoagulation   . Functional asplenia    Past Surgical History  Procedure Laterality Date  . Right hip replacement      08/2006  . Cholecystectomy      01/2008  . Porta cath placement    . Porta cath removal    . Umbilical hernia repair      01/2008  . Excision of left periauricular cyst      10/2009  . Excision of right ear lobe cyst with primary closur      11/2007  . Portacath placement  01/05/2012    Procedure: INSERTION PORT-A-CATH;  Surgeon: Odis Hollingshead, MD;  Location: [facility];  Service: General;  Laterality: N/A;  ultrasound guiced port a cath insertion with fluoroscopy   Family History  Problem Relation Age of Onset  . Sickle cell anemia Mother   . Sickle cell anemia Father   . Sickle cell trait Brother    History  Substance Use Topics  . Smoking status: Former Smoker -- 13 years    Types: Cigarettes    Quit date: 07/08/2010   . Smokeless tobacco: Never Used  . Alcohol Use: No    Review of Systems  Musculoskeletal: Positive for back pain.  All other systems reviewed and are negative.    Allergies  Morphine and related  Home Medications   Current Outpatient Rx  Name  Route  Sig  Dispense  Refill  . celecoxib (CELEBREX) 200 MG capsule   Oral   Take 1 capsule (200 mg total) by mouth 2 (two) times daily.   60 capsule   1   . folic acid (FOLVITE) 1 MG tablet   Oral   Take 1 tablet (1 mg total) by mouth every morning.   30 tablet   11   . HYDROmorphone (DILAUDID) 4 MG tablet   Oral   Take 1 tablet (4 mg total) by mouth every 4 (four) hours as needed for severe pain.   90 tablet   0   . hydroxyurea (HYDREA) 500 MG capsule   Oral   Take 500 mg by mouth 2 (two) times daily with a meal. May take with food to minimize GI side effects.         Marland Kitchen morphine (MS CONTIN) 15 MG 12 hr tablet  Oral   Take 1 tablet (15 mg total) by mouth 2 (two) times daily.   60 tablet   0   . potassium chloride SA (K-DUR,KLOR-CON) 20 MEQ tablet   Oral   Take 1 tablet (20 mEq total) by mouth every morning.   30 tablet   3   . Rivaroxaban (XARELTO) 20 MG TABS tablet   Oral   Take 1 tablet (20 mg total) by mouth every morning.   30 tablet   2   . zolpidem (AMBIEN) 10 MG tablet   Oral   Take 1 tablet (10 mg total) by mouth at bedtime as needed for sleep.   30 tablet   2    BP 133/76  Pulse 87  Temp(Src) 97.8 F (36.6 C) (Oral)  Resp 14  SpO2 99% Physical Exam  Nursing note and vitals reviewed. Constitutional: He is oriented to person, place, and time.  Slightly uncomfortable   HENT:  Head: Normocephalic.  Mouth/Throat: Oropharynx is clear and moist.  Eyes: Conjunctivae are normal. Pupils are equal, round, and reactive to light.  Neck: Normal range of motion. Neck supple.  Cardiovascular: Normal rate, regular rhythm and normal heart sounds.   Pulmonary/Chest: Effort normal and breath sounds  normal. No respiratory distress. He has no wheezes. He has no rales.  Abdominal: Soft. Bowel sounds are normal. He exhibits no distension. There is no tenderness. There is no rebound and no guarding.  Mild L paralumbar tenderness. ? L CVAT. No midline tenderness   Musculoskeletal: Normal range of motion. He exhibits no edema and no tenderness.  Neurological: He is alert and oriented to person, place, and time.  Neg straight leg raise   Skin: Skin is warm and dry.  Psychiatric: He has a normal mood and affect. His behavior is normal. Judgment and thought content normal.    ED Course  Procedures (including critical care time) Labs Review Labs Reviewed  CBC WITH DIFFERENTIAL - Abnormal; Notable for the following:    WBC 16.5 (*)    RBC 2.71 (*)    Hemoglobin 8.7 (*)    HCT 25.0 (*)    RDW 17.8 (*)    Platelets 565 (*)    Neutro Abs 11.5 (*)    Monocytes Relative 14 (*)    Monocytes Absolute 2.3 (*)    All other components within normal limits  BASIC METABOLIC PANEL - Abnormal; Notable for the following:    Potassium 3.4 (*)    Glucose, Bld 115 (*)    BUN 5 (*)    Calcium 8.3 (*)    All other components within normal limits  RETICULOCYTES - Abnormal; Notable for the following:    Retic Ct Pct 11.2 (*)    RBC. 2.71 (*)    Retic Count, Manual 303.5 (*)    All other components within normal limits  URINALYSIS, ROUTINE W REFLEX MICROSCOPIC   Imaging Review No results found.    MDM  No diagnosis found. [name] is a 33 y.o. male here with back pain. Likely typical crisis. Not concerned for acute chest. Will get labs, reticulocyte, give pain meds.   10:08 AM Labs at baseline. Pain improved with 3 doses of dilaudid. I offered admission but he wants to go home to watch super bowl. I called Dr. Jonelle Sidle who knows the patient well and will have him f/u with him in sickle cell clinic.    Wandra Arthurs, MD 03/03/13 1009

## 2013-03-03 NOTE — Discharge Instructions (Signed)
Take your pain meds as prescribed.   Follow up with Dr. Zigmund Daniel in sickle cell clinic.   Return to ER if you have severe pain, chest pain, fevers, trouble breathing.

## 2013-03-06 IMAGING — CR DG CHEST 2V
2 series · 2 of 2 positions shown · non-contrast
Comparison: PA and lateral chest 03/31/2010.

CLINICAL DATA: Chest pain.

CHEST - 2 VIEW

[w chest pa]
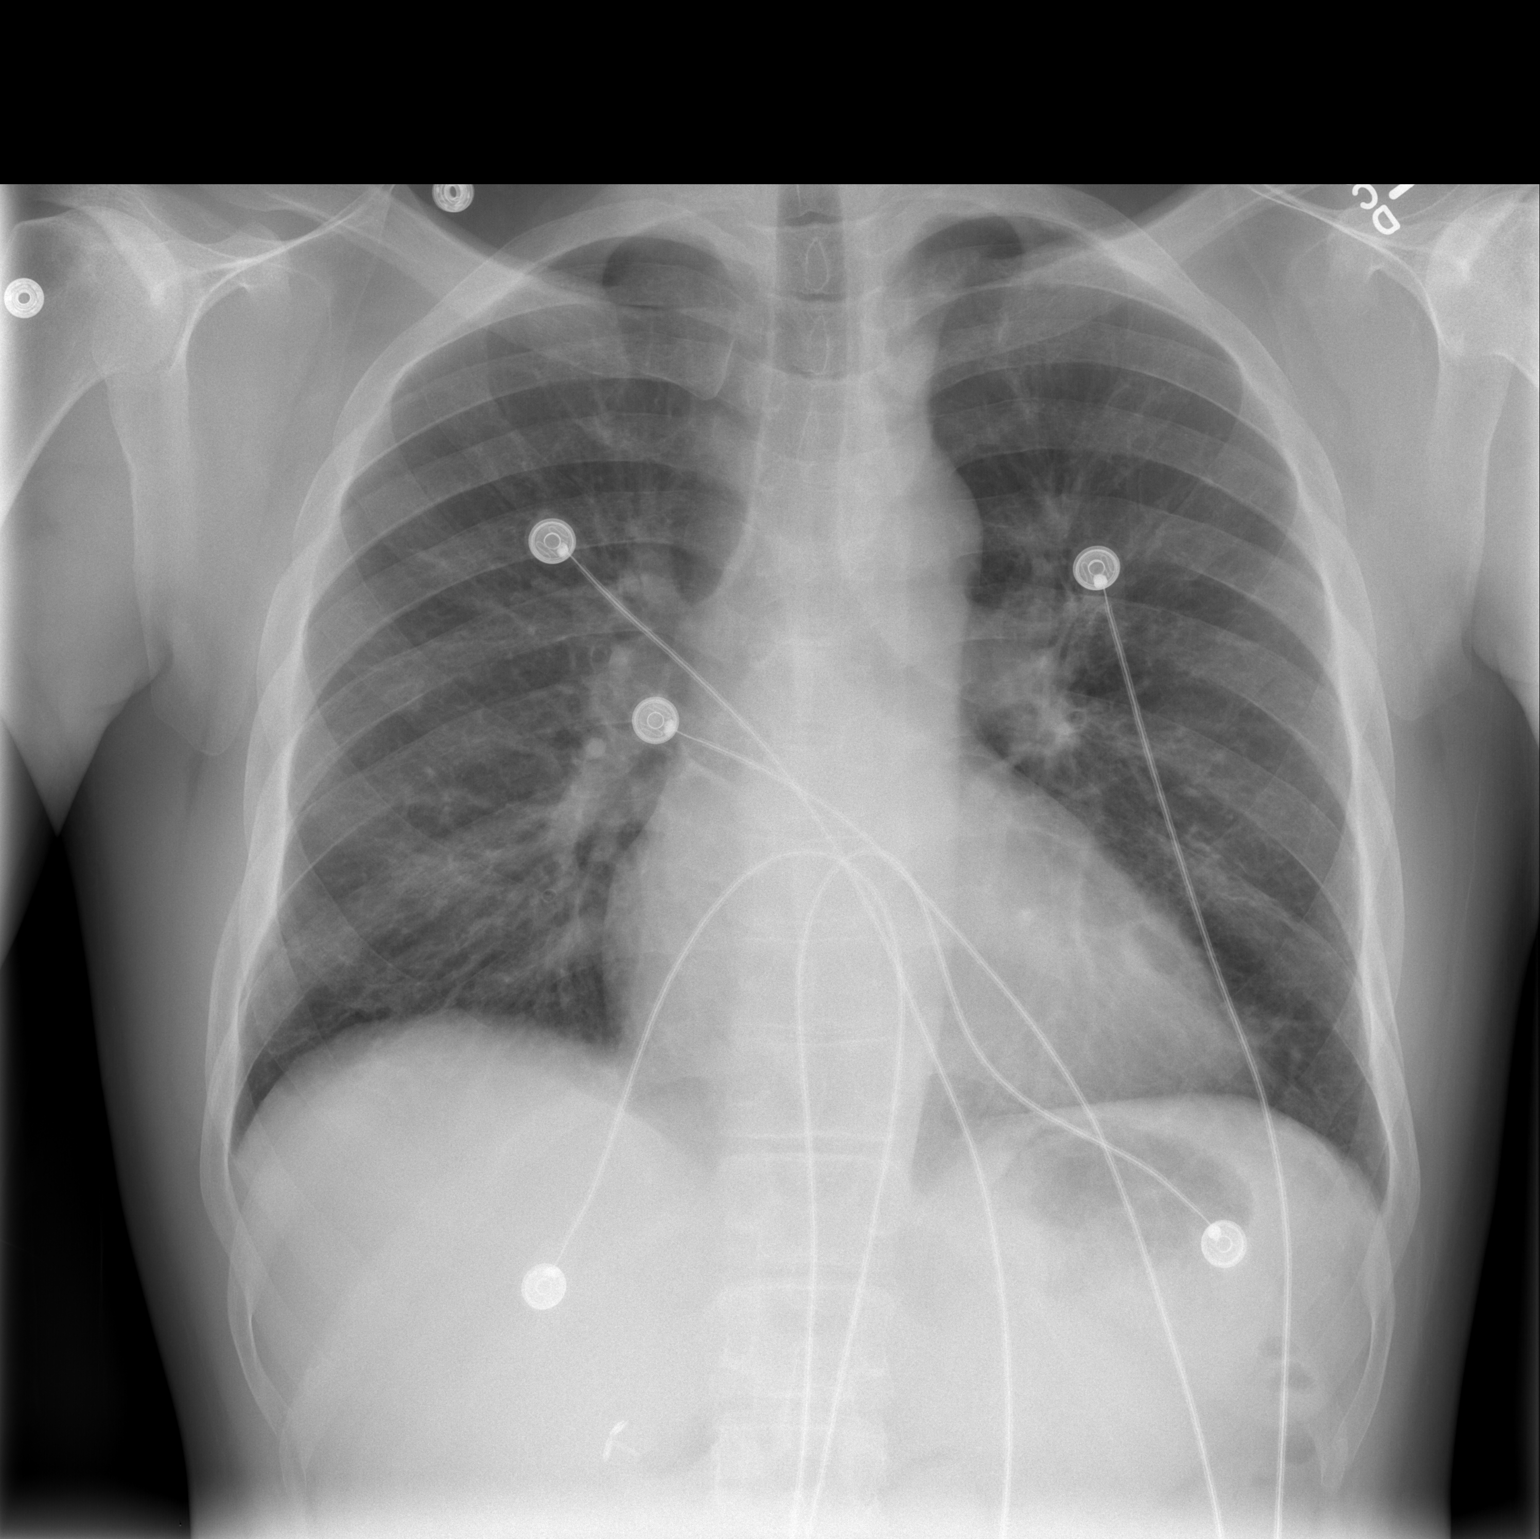

[w chest lat]
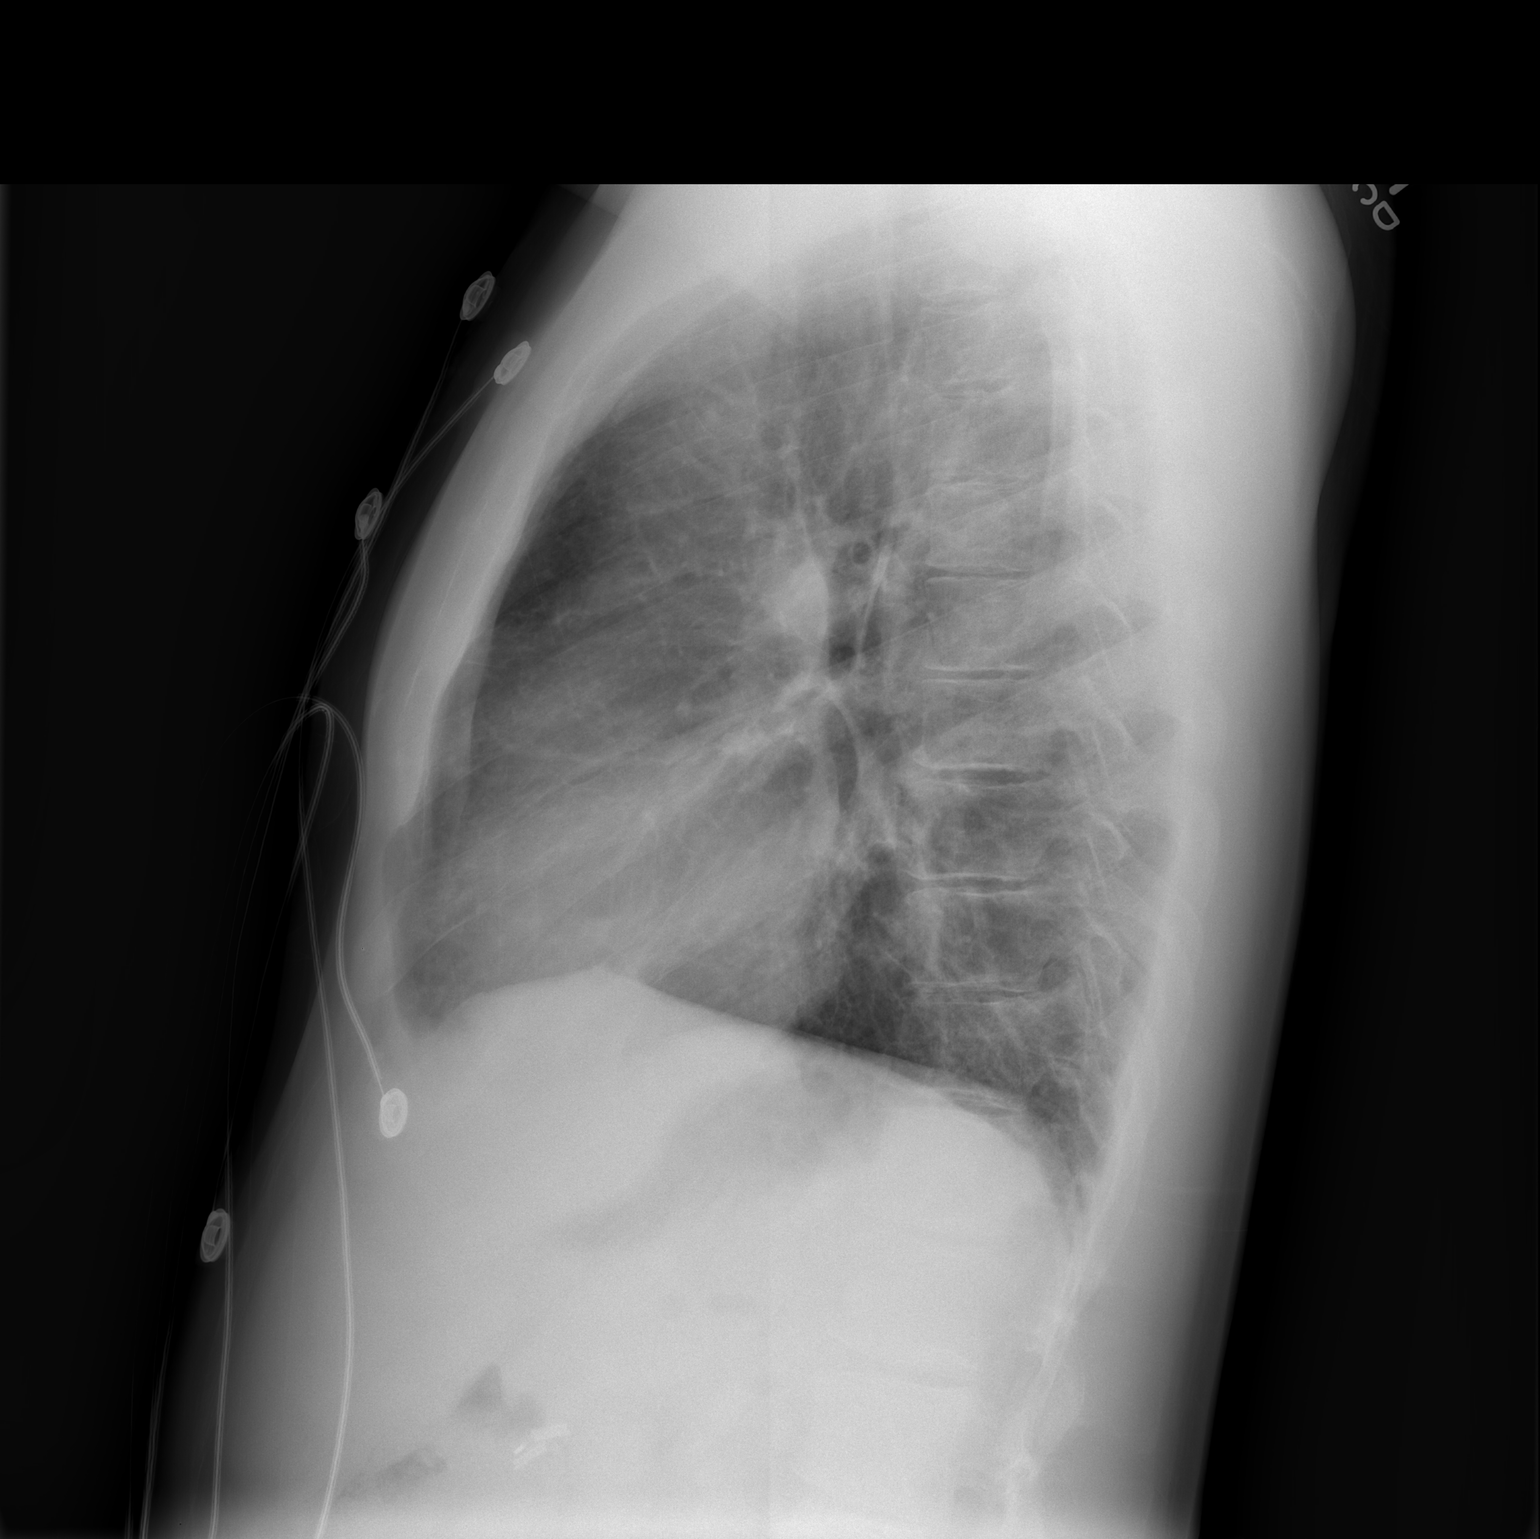

[2 of 2 positions shown; findings below may reference images not displayed]

FINDINGS: Lungs are clear.  No pneumothorax or pleural effusion.
Mild cardiomegaly.  No focal bony abnormality.
IMPRESSION: No acute finding.

## 2013-03-11 ENCOUNTER — Non-Acute Institutional Stay (HOSPITAL_COMMUNITY)
Admission: AD | Admit: 2013-03-11 | Discharge: 2013-03-11 | Disposition: A | Discharge status: Home / Self Care | Payer: Medicare Other | Attending: Family Medicine | Admitting: Family Medicine

## 2013-03-11 ENCOUNTER — Encounter (HOSPITAL_COMMUNITY): Payer: Self-pay | Admitting: Emergency Medicine

## 2013-03-11 ENCOUNTER — Emergency Department (HOSPITAL_COMMUNITY)
Admission: EM | Admit: 2013-03-11 | Discharge: 2013-03-11 | Disposition: A | Discharge status: Home / Self Care | Payer: Medicare Other | Attending: Emergency Medicine | Admitting: Emergency Medicine

## 2013-03-11 ENCOUNTER — Encounter (HOSPITAL_COMMUNITY): Payer: Self-pay | Admitting: Hematology

## 2013-03-11 DIAGNOSIS — Z7901 Long term (current) use of anticoagulants: Secondary | ICD-10-CM | POA: Diagnosis not present

## 2013-03-11 DIAGNOSIS — D571 Sickle-cell disease without crisis: Secondary | ICD-10-CM

## 2013-03-11 DIAGNOSIS — D5700 Hb-SS disease with crisis, unspecified: Secondary | ICD-10-CM | POA: Diagnosis not present

## 2013-03-11 DIAGNOSIS — I10 Essential (primary) hypertension: Secondary | ICD-10-CM | POA: Insufficient documentation

## 2013-03-11 DIAGNOSIS — Z79899 Other long term (current) drug therapy: Secondary | ICD-10-CM | POA: Insufficient documentation

## 2013-03-11 DIAGNOSIS — Z87891 Personal history of nicotine dependence: Secondary | ICD-10-CM | POA: Diagnosis not present

## 2013-03-11 DIAGNOSIS — Z8619 Personal history of other infectious and parasitic diseases: Secondary | ICD-10-CM | POA: Diagnosis not present

## 2013-03-11 DIAGNOSIS — Z86711 Personal history of pulmonary embolism: Secondary | ICD-10-CM | POA: Diagnosis not present

## 2013-03-11 DIAGNOSIS — Z8739 Personal history of other diseases of the musculoskeletal system and connective tissue: Secondary | ICD-10-CM | POA: Insufficient documentation

## 2013-03-11 DIAGNOSIS — F39 Unspecified mood [affective] disorder: Secondary | ICD-10-CM | POA: Insufficient documentation

## 2013-03-11 DIAGNOSIS — E876 Hypokalemia: Secondary | ICD-10-CM | POA: Diagnosis not present

## 2013-03-11 DIAGNOSIS — Z9089 Acquired absence of other organs: Secondary | ICD-10-CM | POA: Insufficient documentation

## 2013-03-11 DIAGNOSIS — Z86718 Personal history of other venous thrombosis and embolism: Secondary | ICD-10-CM | POA: Insufficient documentation

## 2013-03-11 DIAGNOSIS — Z96649 Presence of unspecified artificial hip joint: Secondary | ICD-10-CM | POA: Diagnosis not present

## 2013-03-11 DIAGNOSIS — R079 Chest pain, unspecified: Secondary | ICD-10-CM | POA: Diagnosis not present

## 2013-03-11 LAB — URINALYSIS, ROUTINE W REFLEX MICROSCOPIC
BILIRUBIN URINE: NEGATIVE
Bilirubin Urine: NEGATIVE
GLUCOSE, UA: NEGATIVE mg/dL
Glucose, UA: NEGATIVE mg/dL
HGB URINE DIPSTICK: NEGATIVE
Hgb urine dipstick: NEGATIVE
KETONES UR: NEGATIVE mg/dL
Ketones, ur: NEGATIVE mg/dL
LEUKOCYTES UA: NEGATIVE
Leukocytes, UA: NEGATIVE
NITRITE: NEGATIVE
Nitrite: NEGATIVE
PROTEIN: NEGATIVE mg/dL
Protein, ur: NEGATIVE mg/dL
SPECIFIC GRAVITY, URINE: 1.012 (ref 1.005–1.030)
Specific Gravity, Urine: 1.013 (ref 1.005–1.030)
UROBILINOGEN UA: 1 mg/dL (ref 0.0–1.0)
Urobilinogen, UA: 2 mg/dL — ABNORMAL HIGH (ref 0.0–1.0)
pH: 7.5 (ref 5.0–8.0)
pH: 7 (ref 5.0–8.0)

## 2013-03-11 LAB — COMPREHENSIVE METABOLIC PANEL
ALK PHOS: 89 U/L (ref 39–117)
ALT: 18 U/L (ref 0–53)
ALT: 18 U/L (ref 0–53)
AST: 28 U/L (ref 0–37)
AST: 26 U/L (ref 0–37)
Albumin: 3.8 g/dL (ref 3.5–5.2)
Albumin: 4 g/dL (ref 3.5–5.2)
Alkaline Phosphatase: 91 U/L (ref 39–117)
BILIRUBIN TOTAL: 6.3 mg/dL — AB (ref 0.3–1.2)
BILIRUBIN TOTAL: 5.8 mg/dL — AB (ref 0.3–1.2)
BUN: 8 mg/dL (ref 6–23)
BUN: 5 mg/dL — AB (ref 6–23)
CALCIUM: 8.5 mg/dL (ref 8.4–10.5)
CHLORIDE: 104 meq/L (ref 96–112)
CHLORIDE: 106 meq/L (ref 96–112)
CO2: 23 meq/L (ref 19–32)
CO2: 22 mEq/L (ref 19–32)
Calcium: 8.6 mg/dL (ref 8.4–10.5)
Creatinine, Ser: 0.59 mg/dL (ref 0.50–1.35)
Creatinine, Ser: 0.54 mg/dL (ref 0.50–1.35)
GFR calc non Af Amer: >90 mL/min (ref >90)
GLUCOSE: 104 mg/dL — AB (ref 70–99)
Glucose, Bld: 125 mg/dL — ABNORMAL HIGH (ref 70–99)
POTASSIUM: 3.5 meq/L — AB (ref 3.7–5.3)
Potassium: 3.1 mEq/L — ABNORMAL LOW (ref 3.7–5.3)
SODIUM: 140 meq/L (ref 137–147)
Sodium: 140 mEq/L (ref 137–147)
TOTAL PROTEIN: 7.6 g/dL (ref 6.0–8.3)
Total Protein: 6.9 g/dL (ref 6.0–8.3)

## 2013-03-11 LAB — RAPID URINE DRUG SCREEN, HOSP PERFORMED
AMPHETAMINES: NOT DETECTED
Barbiturates: NOT DETECTED
Benzodiazepines: NOT DETECTED
Cocaine: NOT DETECTED
OPIATES: POSITIVE — AB
Tetrahydrocannabinol: POSITIVE — AB

## 2013-03-11 LAB — CBC WITH DIFFERENTIAL/PLATELET
Basophils Absolute: 0.1 10*3/uL (ref 0.0–0.1)
Basophils Relative: 1 % (ref 0–1)
EOS ABS: 0.5 10*3/uL (ref 0.0–0.7)
Eosinophils Relative: 3 % (ref 0–5)
HCT: 24.5 % — ABNORMAL LOW (ref 39.0–52.0)
Hemoglobin: 8.6 g/dL — ABNORMAL LOW (ref 13.0–17.0)
LYMPHS ABS: 2.5 10*3/uL (ref 0.7–4.0)
Lymphocytes Relative: 16 % (ref 12–46)
MCH: 32.6 pg (ref 26.0–34.0)
MCHC: 35.1 g/dL (ref 30.0–36.0)
MCV: 92.8 fL (ref 78.0–100.0)
Monocytes Absolute: 2.2 10*3/uL — ABNORMAL HIGH (ref 0.1–1.0)
Monocytes Relative: 14 % — ABNORMAL HIGH (ref 3–12)
Neutro Abs: 10.7 10*3/uL — ABNORMAL HIGH (ref 1.7–7.7)
Neutrophils Relative %: 66 % (ref 43–77)
PLATELETS: 473 10*3/uL — AB (ref 150–400)
RBC: 2.64 MIL/uL — AB (ref 4.22–5.81)
RDW: 19.1 % — ABNORMAL HIGH (ref 11.5–15.5)
WBC: 16.1 10*3/uL — ABNORMAL HIGH (ref 4.0–10.5)

## 2013-03-11 LAB — MAGNESIUM: Magnesium: 1.5 mg/dL (ref 1.5–2.5)

## 2013-03-11 MED ORDER — NALOXONE HCL 0.4 MG/ML IJ SOLN: 0.4000 mg | INTRAMUSCULAR | Status: DC | PRN

## 2013-03-11 MED ORDER — HYDROMORPHONE HCL PF 2 MG/ML IJ SOLN
2.0000 mg | INTRAMUSCULAR | Status: AC
  Administered 2013-03-11 (×2): 2 mg via INTRAVENOUS
  Filled 2013-03-11 (×2): qty 1

## 2013-03-11 MED ORDER — KCL IN DEXTROSE-NACL 30-5-0.45 MEQ/L-%-% IV SOLN
INTRAVENOUS | Status: DC
  Administered 2013-03-11: 13:00:00 via INTRAVENOUS
  Filled 2013-03-11 (×3): qty 1000

## 2013-03-11 MED ORDER — POTASSIUM CHLORIDE CRYS ER 20 MEQ PO TBCR
20.0000 meq | EXTENDED_RELEASE_TABLET | Freq: Every morning | ORAL | Status: DC
  Administered 2013-03-11: 20 meq via ORAL
  Filled 2013-03-11: qty 1

## 2013-03-11 MED ORDER — MORPHINE SULFATE ER 15 MG PO TBCR: 15.0000 mg | EXTENDED_RELEASE_TABLET | Freq: Two times a day (BID) | ORAL | Status: DC

## 2013-03-11 MED ORDER — SODIUM CHLORIDE 0.9 % IJ SOLN
10.0000 mL | INTRAMUSCULAR | Status: AC | PRN
  Administered 2013-03-11: 10 mL

## 2013-03-11 MED ORDER — PANTOPRAZOLE SODIUM 40 MG PO TBEC
40.0000 mg | DELAYED_RELEASE_TABLET | Freq: Every day | ORAL | Status: DC
  Administered 2013-03-11: 40 mg via ORAL
  Filled 2013-03-11: qty 1

## 2013-03-11 MED ORDER — POTASSIUM CHLORIDE CRYS ER 20 MEQ PO TBCR
40.0000 meq | EXTENDED_RELEASE_TABLET | Freq: Once | ORAL | Status: AC
  Administered 2013-03-11: 40 meq via ORAL
  Filled 2013-03-11: qty 2

## 2013-03-11 MED ORDER — FOLIC ACID 1 MG PO TABS
1.0000 mg | ORAL_TABLET | Freq: Every morning | ORAL | Status: DC
  Administered 2013-03-11: 1 mg via ORAL
  Filled 2013-03-11: qty 1

## 2013-03-11 MED ORDER — DIPHENHYDRAMINE HCL 50 MG/ML IJ SOLN: 12.5000 mg | Freq: Four times a day (QID) | INTRAMUSCULAR | Status: DC | PRN

## 2013-03-11 MED ORDER — CELECOXIB 200 MG PO CAPS
200.0000 mg | ORAL_CAPSULE | Freq: Two times a day (BID) | ORAL | Status: DC
  Administered 2013-03-11: 200 mg via ORAL
  Filled 2013-03-11: qty 1

## 2013-03-11 MED ORDER — DIPHENHYDRAMINE HCL 12.5 MG/5ML PO ELIX
12.5000 mg | ORAL_SOLUTION | Freq: Four times a day (QID) | ORAL | Status: DC | PRN
Start: 2013-03-11 — End: 2013-03-11

## 2013-03-11 MED ORDER — HEPARIN SOD (PORK) LOCK FLUSH 100 UNIT/ML IV SOLN
500.0000 [IU] | INTRAVENOUS | Status: AC | PRN
  Administered 2013-03-11: 500 [IU]
  Filled 2013-03-11: qty 5

## 2013-03-11 MED ORDER — SODIUM CHLORIDE 0.9 % IJ SOLN: 9.0000 mL | INTRAMUSCULAR | Status: DC | PRN

## 2013-03-11 MED ORDER — SODIUM CHLORIDE 0.9 % IV BOLUS (SEPSIS)
1000.0000 mL | Freq: Once | INTRAVENOUS | Status: AC
  Administered 2013-03-11: 1000 mL via INTRAVENOUS

## 2013-03-11 MED ORDER — HYDROMORPHONE 0.3 MG/ML IV SOLN
INTRAVENOUS | Status: DC
  Administered 2013-03-11 (×3): via INTRAVENOUS
  Administered 2013-03-11: 7.64 mg via INTRAVENOUS
  Filled 2013-03-11 (×3): qty 25

## 2013-03-11 MED ORDER — RIVAROXABAN 20 MG PO TABS
20.0000 mg | ORAL_TABLET | Freq: Every day | ORAL | Status: DC
  Administered 2013-03-11: 20 mg via ORAL
  Filled 2013-03-11: qty 1

## 2013-03-11 MED ORDER — HYDROXYUREA 500 MG PO CAPS
500.0000 mg | ORAL_CAPSULE | Freq: Two times a day (BID) | ORAL | Status: DC
  Administered 2013-03-11: 500 mg via ORAL
  Filled 2013-03-11: qty 1

## 2013-03-11 MED ORDER — DEXTROSE-NACL 5-0.45 % IV SOLN
INTRAVENOUS | Status: DC
Start: 2013-03-11 — End: 2013-03-11
  Administered 2013-03-11: 12:00:00 via INTRAVENOUS

## 2013-03-11 MED ORDER — HYDROMORPHONE HCL PF 2 MG/ML IJ SOLN
4.0000 mg | Freq: Once | INTRAMUSCULAR | Status: AC
  Administered 2013-03-11: 4 mg via INTRAVENOUS
  Filled 2013-03-11: qty 2

## 2013-03-11 MED ORDER — ONDANSETRON HCL 4 MG/2ML IJ SOLN: 4.0000 mg | Freq: Four times a day (QID) | INTRAMUSCULAR | Status: DC | PRN

## 2013-03-11 NOTE — ED Notes (Signed)
EDP already spoke with Dr. Wynetta Emery and patient was accepted. Again attempted to give report and receiving nurse with another patient. Receiving nurse to return call for report.

## 2013-03-11 NOTE — Progress Notes (Addendum)
Patient ID: [name], male   DOB: 29-May-1980, 33 y.o.   MRN: [id number] PCA d/c's as per order, pt states pain is dopwn to a 5/10.  Port flushed as per protocol. POrt deaccessed without difficulty.  RN discussed discharge instructions with pt for him to take two potassium pills a day for three days as his potassium is low. Pt states he understands.  Pt left ambulating by self at 1910hrs in no apparent distress.

## 2013-03-11 NOTE — H&P (Addendum)
[place] History and Physical  KOTA CIANCIO BPZ:[id number] DOB: 12-Jul-1980 DOA: 03/11/2013  PCP: [name],[name], MD   Chief Complaint: pain over body  HPI: Pt presents from ER after reporting painful episode that started yesterday unrelieved by home meds.  Pt says he developed pain in ribs, legs and back.  He has also had some headache.  He is itching over body.  He woke up this morning at 5 am and reports that he had throbbing pain in legs, ribs and back.  No nausea or vomiting reported.  He denies shortness of breath.  He was seen in ER and given dilaudid 4 mg, then two repeat doses of 2 mg each and his pain could not be controlled.  His pain is normally 3-4/10 and now his pain is 8/10.  He was sent to the day hospital for further evaluation and management.  His hemoglobin is 8.6.    Review of Systems:  Constitutional: No weight loss, night sweats, Fevers, chills, fatigue.  HEENT:Positive for headaches, dizziness. Negative for seizures, vision changes, difficulty swallowing,Tooth/dental problems,Sore throat, No sneezing, itching, ear ache, nasal congestion, post nasal drip.  Cardio-vascular: No chest pain, Orthopnea, PND, swelling in lower extremities, anasarca, dizziness, palpitations  GI: No heartburn, indigestion, abdominal pain, nausea, vomiting, diarrhea, change in bowel habits, loss of appetite  Resp: No shortness of breath with exertion or at rest. No excess mucus, no productive cough, No non-productive cough, No coughing up of blood.No change in color of mucus.No wheezing.No chest wall deformity  Skin: no rash or lesions.  GU: no dysuria, change in color of urine, no urgency or frequency. No flank pain.  Musculoskeletal: Positive bilateral leg pain. No decreased range of motion. No back pain.  Psych: No change in mood or affect. No depression or anxiety. No memory loss.   Past Medical History  Diagnosis Date  . Sickle cell anemia   . Blood transfusion   .  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 Hypokalemia   . Mood disorder   . Pulmonary embolism   . Avascular necrosis   . Leukocytosis     Chronic  . Thrombocytosis     Chronic  . Hypertension   . C. difficile colitis   . Uses marijuana   . Chronic anticoagulation   . Functional asplenia    Past Surgical History  Procedure Laterality Date  . Right hip replacement      08/2006  . Cholecystectomy      01/2008  . Porta cath placement    . Porta cath removal    . Umbilical hernia repair      01/2008  . Excision of left periauricular cyst      10/2009  . Excision of right ear lobe cyst with primary closur      11/2007  . Portacath placement  01/05/2012    Procedure: INSERTION PORT-A-CATH;  Surgeon: Odis Hollingshead, MD;  Location: [facility];  Service: General;  Laterality: N/A;  ultrasound guiced port a cath insertion with fluoroscopy   Social History:  reports that he quit smoking about 2 years ago. His smoking use included Cigarettes. He smoked 0.00 packs per day for 13 years. He has never used smokeless tobacco. He reports that he uses illicit drugs (Marijuana). He reports that he does not drink alcohol.  Allergies  Allergen Reactions  . Morphine And Related Hives and Rash    Pt states he can take PO, but  not IV and he is able to tolerate Dilaudid with no reactions.    Family History  Problem Relation Age of Onset  . Sickle cell anemia Mother   . Sickle cell anemia Father   . Sickle cell trait Brother     Prior to Admission medications   Medication Sig Start Date End Date Taking? Authorizing Provider  celecoxib (CELEBREX) 200 MG capsule Take 1 capsule (200 mg total) by mouth 2 (two) times daily. 11/19/12  Yes [name], MD  folic acid (FOLVITE) 1 MG tablet Take 1 tablet (1 mg total) by mouth every morning. 06/05/12  Yes [name], MD  HYDROmorphone (DILAUDID) 4 MG tablet Take 1 tablet (4 mg total) by mouth every 4 (four) hours as  needed for severe pain. 02/21/13  Yes Dorena Dew, FNP  hydroxyurea (HYDREA) 500 MG capsule Take 500 mg by mouth 2 (two) times daily with a meal. May take with food to minimize GI side effects.   Yes Historical Provider, MD  morphine (MS CONTIN) 15 MG 12 hr tablet Take 1 tablet (15 mg total) by mouth 2 (two) times daily. 02/21/13  Yes Dorena Dew, FNP  potassium chloride SA (K-DUR,KLOR-CON) 20 MEQ tablet Take 1 tablet (20 mEq total) by mouth every morning. 07/31/12  Yes [name], MD  Rivaroxaban (XARELTO) 20 MG TABS tablet Take 1 tablet (20 mg total) by mouth every morning. 11/21/12  Yes [name], MD  zolpidem (AMBIEN) 10 MG tablet Take 1 tablet (10 mg total) by mouth at bedtime as needed for sleep. 11/28/12  Yes Kerin Perna, NP   Physical Exam: Filed Vitals:   03/11/13 1112  BP: 139/89  Pulse: 84  Temp: 98.5 F (36.9 C)  TempSrc: Oral  Resp: 18  Weight: 172 lb (78.019 kg)  SpO2: 93%   General appearance: alert, cooperative, appears stated age and mild distress Head: Normocephalic, without obvious abnormality, atraumatic Eyes: bilateral scleral icterus Nose: Nares normal. Septum midline. Mucosa normal. No drainage or sinus tenderness., no discharge Throat: dry mucus membranes Neck: no adenopathy, no carotid bruit, no JVD, supple, symmetrical, trachea midline and thyroid not enlarged, symmetric, no tenderness/mass/nodules Lungs: clear to auscultation bilaterally and normal percussion bilaterally Chest wall: chest wall tenderness to palpation Heart: S1, S2 normal Abdomen: soft, nondistended, mild diffuse TTP, no masses no guarding Extremities: extremities normal, atraumatic, no cyanosis or edema Skin: Skin color, texture, turgor normal. No rashes or lesions Neurologic: Grossly normal  Labs on Admission:   Basic Metabolic Panel:  Recent Labs Lab 03/11/13 0808  NA 140  K 3.1*  CL 104  CO2 23  GLUCOSE 104*  BUN 8  CREATININE 0.59  CALCIUM  8.5   Liver Function Tests:  Recent Labs Lab 03/11/13 0808  AST 28  ALT 18  ALKPHOS 91  BILITOT 6.3*  PROT 6.9  ALBUMIN 3.8   CBC:  Recent Labs Lab 03/11/13 0808  WBC 16.1*  NEUTROABS 10.7*  HGB 8.6*  HCT 24.5*  MCV 92.8  PLT 473*   BNP: No components found with this basename: POCBNP,   Radiological Exams on Admission: No results found.  Assessment/Plan: Active Problems:   Acute vaso-occlusive sickle cell pain   Hypokalemia  Admit to day hospital, start dilaudid PCA, replace potassium, IVFs, close monitoring celebrex for inflammation protonix for GI protection Resume xarelto  Recheck potassium   Time spend: 40 mins Code Status: Full   Irwin Brakeman, MD  Pager [phone]  If 7PM-7AM,  please contact night-coverage [email or web address] Password [email or web address] 03/11/2013, 11:30 AM

## 2013-03-11 NOTE — Discharge Summary (Signed)
[facility] Discharge Summary   Patient ID: [name] MRN: [id number] DOB/AGE: 1981-01-18 33 y.o.  Admit date: 03/11/2013 Discharge date: 03/11/2013  Primary Care Physician:  [name],[name], MD  Admission Diagnoses:  Active Problems:   Acute sickle cell crisis   Discharge Diagnoses:   Sickle Cell Pain Crisis  Discharge Medications:    Medication List         celecoxib 200 MG capsule  Commonly known as:  CELEBREX  Take 1 capsule (200 mg total) by mouth 2 (two) times daily.     folic acid 1 MG tablet  Commonly known as:  FOLVITE  Take 1 tablet (1 mg total) by mouth every morning.     HYDROmorphone 4 MG tablet  Commonly known as:  DILAUDID  Take 1 tablet (4 mg total) by mouth every 4 (four) hours as needed for severe pain.     hydroxyurea 500 MG capsule  Commonly known as:  HYDREA  Take 500 mg by mouth 2 (two) times daily with a meal. May take with food to minimize GI side effects.     morphine 15 MG 12 hr tablet  Commonly known as:  MS CONTIN  Take 1 tablet (15 mg total) by mouth 2 (two) times daily.     potassium chloride SA 20 MEQ tablet  Commonly known as:  K-DUR,KLOR-CON  Take 1 tablet (20 mEq total) by mouth every morning.     Rivaroxaban 20 MG Tabs tablet  Commonly known as:  XARELTO  Take 1 tablet (20 mg total) by mouth every morning.     zolpidem 10 MG tablet  Commonly known as:  AMBIEN  Take 1 tablet (10 mg total) by mouth at bedtime as needed for sleep.         Consults:  None  Significant Diagnostic Studies:  No results found.   [facility] Course:  Hb SS with crisis: Pt was managed with IV Dilaudid initially per IV push at [facility], patient was transitioned to weight based PCA. Pt also received IVF with hypotonic fluids. At the time of discharge pain was down to 5/10 and pt had no functional limitations in ADL.  Patient is scheduled to follow up on 05/15/2013 at 10 am at [facility].    Physical Exam at Discharge:  BP 132/78  Pulse 85  Temp(Src) 98.5 F (36.9 C) (Oral)  Resp 21  Wt 172 lb (78.019 kg)  SpO2 94%  General: Alert, awake, oriented x3, in no acute distress.  HEENT: [place]/AT PEERL, EOMI, anicteric.  Heart: Regular rate and rhythm, without murmurs, rubs, gallops.  Lungs: Clear to auscultation, no wheezing or rhonchi noted. No pleuritic pain.  Abdomen: Soft, nontender, nondistended, positive bowel sounds, no masses no hepatosplenomegaly noted.  Neuro: No focal neurological deficits noted cranial nerves II through XII grossly intact. Strength functional in bilateral upper and lower extremities.  Musculoskeletal: No warm swelling or erythema around joints, no spinal tenderness noted.  Psychiatric: Patient alert and oriented x3, good insight and cognition, good recent to remote recall.   Disposition at Discharge: Home Discharge Orders:     Discharge Orders   Future Appointments Provider Department Dept Phone   05/15/2013 10:00 AM Dorena Dew, [place] [phone]   Future Orders Complete By Expires   Discharge patient  As directed       Condition at Discharge:   Stable   Time spent  on Discharge:  45 minutes  Signed: Lyman Balingit M 03/11/2013, 6:56 PM

## 2013-03-11 NOTE — ED Provider Notes (Addendum)
CSN: [id number]     Arrival date & time 03/11/13  0708 History   First MD Initiated Contact with Patient 03/11/13 [phone]     Chief Complaint  Patient presents with  . Sickle Cell Pain Crisis   (Consider location/radiation/quality/duration/timing/severity/associated sxs/prior Treatment) HPI Patient presents with pain in his head, chest, sides. After a minor thing is that yesterday, the patient was generally little approximately 4 hours ago, when his pain became severe. The pain is otherwise nonradiating, sore. Pain has not improved with Dilaudid, OxyContin. No new dyspnea, lightheadedness, passing out, vomiting. No fever, no chills. Pain is somewhat atypical for the patient.  Past Medical History  Diagnosis Date  . Sickle cell anemia   . Blood transfusion   . 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 Hypokalemia   . Mood disorder   . Pulmonary embolism   . Avascular necrosis   . Leukocytosis     Chronic  . Thrombocytosis     Chronic  . Hypertension   . C. difficile colitis   . Uses marijuana   . Chronic anticoagulation   . Functional asplenia    Past Surgical History  Procedure Laterality Date  . Right hip replacement      08/2006  . Cholecystectomy      01/2008  . Porta cath placement    . Porta cath removal    . Umbilical hernia repair      01/2008  . Excision of left periauricular cyst      10/2009  . Excision of right ear lobe cyst with primary closur      11/2007  . Portacath placement  01/05/2012    Procedure: INSERTION PORT-A-CATH;  Surgeon: Odis Hollingshead, MD;  Location: [place];  Service: General;  Laterality: N/A;  ultrasound guiced port a cath insertion with fluoroscopy   Family History  Problem Relation Age of Onset  . Sickle cell anemia Mother   . Sickle cell anemia Father   . Sickle cell trait Brother    History  Substance Use Topics  . Smoking status: Former Smoker -- 13 years    Types: Cigarettes    Quit date: 07/08/2010   . Smokeless tobacco: Never Used  . Alcohol Use: No    Review of Systems  Constitutional:       Per HPI, otherwise negative  HENT:       Per HPI, otherwise negative  Respiratory:       Per HPI, otherwise negative  Cardiovascular:       Per HPI, otherwise negative  Gastrointestinal: Negative for vomiting.  Endocrine:       Negative aside from HPI  Genitourinary:       Neg aside from HPI   Musculoskeletal:       Per HPI, otherwise negative  Skin: Negative.   Neurological: Negative for syncope.    Allergies  Morphine and related  Home Medications   Current Outpatient Rx  Name  Route  Sig  Dispense  Refill  . celecoxib (CELEBREX) 200 MG capsule   Oral   Take 1 capsule (200 mg total) by mouth 2 (two) times daily.   60 capsule   1   . folic acid (FOLVITE) 1 MG tablet   Oral   Take 1 tablet (1 mg total) by mouth every morning.   30 tablet   11   . HYDROmorphone (DILAUDID) 4 MG tablet   Oral   Take 1 tablet (4 mg  total) by mouth every 4 (four) hours as needed for severe pain.   90 tablet   0   . hydroxyurea (HYDREA) 500 MG capsule   Oral   Take 500 mg by mouth 2 (two) times daily with a meal. May take with food to minimize GI side effects.         Marland Kitchen morphine (MS CONTIN) 15 MG 12 hr tablet   Oral   Take 1 tablet (15 mg total) by mouth 2 (two) times daily.   60 tablet   0   . potassium chloride SA (K-DUR,KLOR-CON) 20 MEQ tablet   Oral   Take 1 tablet (20 mEq total) by mouth every morning.   30 tablet   3   . Rivaroxaban (XARELTO) 20 MG TABS tablet   Oral   Take 1 tablet (20 mg total) by mouth every morning.   30 tablet   2   . zolpidem (AMBIEN) 10 MG tablet   Oral   Take 1 tablet (10 mg total) by mouth at bedtime as needed for sleep.   30 tablet   2    BP 126/84  Pulse 89  Temp(Src) 98.4 F (36.9 C) (Oral)  Resp 20  SpO2 97% Physical Exam  Nursing note and vitals reviewed. Constitutional: He is oriented to person, place, and time. He  appears well-developed. No distress.  HENT:  Head: Normocephalic and atraumatic.  Eyes: Conjunctivae and EOM are normal.  Cardiovascular: Normal rate and regular rhythm.   Pulmonary/Chest: Effort normal. No stridor. No respiratory distress. He has no wheezes.  Abdominal: He exhibits no distension.  Musculoskeletal: He exhibits no edema.  Neurological: He is alert and oriented to person, place, and time.  Skin: Skin is warm and dry.  Psychiatric: He has a normal mood and affect.    ED Course  Procedures (including critical care time) Labs Review Labs Reviewed  COMPREHENSIVE METABOLIC PANEL  URINALYSIS, ROUTINE W REFLEX MICROSCOPIC  CBC WITH DIFFERENTIAL   Imaging Review No results found.  EKG Interpretation    Date/Time:  Monday March 11 2013 08:40:54 EST Ventricular Rate:  84 PR Interval:  181 QRS Duration: 112 QT Interval:  393 QTC Calculation: 465 R Axis:   -44 Text Interpretation:  Sinus rhythm LAD, consider left anterior fascicular block Baseline wander in lead(s) V3 V6 Sinus rhythm Artifact , otherwise no gross changes from most recent ECG Abnormal ekg Confirmed by Carmin Muskrat  MD [phone]) on 03/11/2013 9:56:00 AM           after the initial evaluation I reviewed the patient's chart, including medication guidelines provided by his primary care team.  Update: Mild improvement following med boluses. Is ambulatory, vital signs were stable, no evidence of distress.  9:53 AM I discussed his case with Dr. Wynetta Emery at the [facility]. Patient was accepted and will be d/c to proceed there directly.   MDM   1. Sickle cell crisis    This patient, will not see her presents with ongoing pain.  On exam he is awake, alert, and in no distress.  Patient's labs are consistent with prior evaluations for him here.  Absent fever, distress, but with no resolution of his pain, he was discharged from here to the sickle cell clinic for further evaluation and  management.    Carmin Muskrat, MD 03/11/13 4193  Carmin Muskrat, MD 03/11/13 [phone]

## 2013-03-11 NOTE — ED Notes (Signed)
Tiffany, RN to come and take patient to the sickle cell clinic.

## 2013-03-11 NOTE — ED Notes (Addendum)
Attempted to call report to the Sickle cell clinic. Spoke with Tiffany and she stated that she had not been informed of patient coming to the clinic and would not take report until Dr. Wynetta Emery aware of patient coming.

## 2013-03-11 NOTE — ED Notes (Signed)
PT states he began to have sickle cell crisis starting yesterday with pain from head to knees. Pain typical for crisis pain. No n/v/d.

## 2013-03-11 NOTE — Discharge Instructions (Signed)
Potassium (K-Dur) increase potassium dose to 40 mEQ for the next 3 days.  Continue with current medication regimen.  Increase fluid intake  Sickle Cell Anemia, Adult Sickle cell anemia is a condition in which red blood cells have an abnormal "sickle" shape. This abnormal shape shortens the cells' life span, which results in a lower than normal concentration of red blood cells in the blood. The sickle shape also causes the cells to clump together and block free blood flow through the blood vessels. As a result, the tissues and organs of the body do not receive enough oxygen. Sickle cell anemia causes organ damage and pain and increases the risk of infection. CAUSES  Sickle cell anemia is a genetic disorder. Those who receive two copies of the gene have the condition, and those who receive one copy have the trait. RISK FACTORS The sickle cell gene is most common in people whose families originated in [place]. Other areas of the globe where sickle cell trait occurs include the [place], [place], and the [place].  SIGNS AND SYMPTOMS  Pain, especially in the extremities, back, chest, or abdomen (common). The pain may start suddenly or may develop following an illness, especially if there is dehydration. Pain can also occur due to overexertion or exposure to extreme temperature changes.  Frequent severe bacterial infections, especially certain types of pneumonia and meningitis.  Pain and swelling in the hands and feet.  Decreased activity.   Loss of appetite.   Change in behavior.  Headaches.  Seizures.  Shortness of breath or difficulty breathing.  Vision changes.  Skin ulcers. Those with the trait may not have symptoms or they may have mild symptoms.  DIAGNOSIS  Sickle cell anemia is diagnosed with blood tests that demonstrate the genetic trait. It is often diagnosed during the newborn period, due to mandatory testing nationwide. A variety of  blood tests, X-rays, CT scans, MRI scans, ultrasounds, and lung function tests may also be done to monitor the condition. TREATMENT  Sickle cell anemia may be treated with:  Medicines. You may be given pain medicines, antibiotic medicines (to treat and prevent infections) or medicines to increase the production of certain types of hemoglobin.  Fluids.  Oxygen.  Blood transfusions. HOME CARE INSTRUCTIONS   Drink enough fluid to keep your urine clear or pale yellow. Increase your fluid intake in hot weather and during exercise.  Do not smoke. Smoking lowers oxygen levels in the blood.   Only take over-the-counter or prescription medicines for pain, fever, or discomfort as directed by your health care provider.  Take antibiotics as directed by your health care provider. Make sure you finish them it even if you start to feel better.   Take supplements as directed by your health care provider.   Consider wearing a medical alert bracelet. This tells anyone caring for you in an emergency of your condition.   When traveling, keep your medical information, health care provider's names, and the medicines you take with you at all times.   If you develop a fever, do not take medicines to reduce the fever right away. This could cover up a problem that is developing. Notify your health care provider.  Keep all follow-up appointments with your health care provider. Sickle cell anemia requires regular medical care. SEEK MEDICAL CARE IF: You have a fever. SEEK IMMEDIATE MEDICAL CARE IF:   You feel dizzy or faint.   You have new abdominal pain, especially on the left side  near the stomach area.   You develop a persistent, often uncomfortable and painful penile erection (priapism). If this is not treated immediately it will lead to impotence.   You have numbness your arms or legs or you have a hard time moving them.   You have a hard time with speech.   You have a fever or  persistent symptoms for more than 2 3 days.   You have a fever and your symptoms suddenly get worse.   You have signs or symptoms of infection. These include:   Chills.   Abnormal tiredness (lethargy).   Irritability.   Poor eating.   Vomiting.   You develop pain that is not helped with medicine.   You develop shortness of breath.  You have pain in your chest.   You are coughing up pus-like or bloody sputum.   You develop a stiff neck.  Your feet or hands swell or have pain.  Your abdomen appears bloated.  You develop joint pain. MAKE SURE YOU:  Understand these instructions.  Will watch your child's condition.  Will get help right away if your child is not doing well or gets worse. Document Released: 04/27/2005 Document Revised: 11/07/2012 Document Reviewed: 08/29/2012 [facility] Patient Information 2014 [place], [place].

## 2013-03-12 NOTE — Discharge Summary (Signed)
Pt was seen and examined and monitored throughout time spent in Day hospital and at discharge.  I agree with the assessment and plan outlined by Cammie Sickle, NP.    Murvin Natal, MD

## 2013-03-13 ENCOUNTER — Telehealth (HOSPITAL_COMMUNITY): Payer: Self-pay | Admitting: *Deleted

## 2013-03-13 ENCOUNTER — Encounter (HOSPITAL_COMMUNITY): Payer: Self-pay | Admitting: *Deleted

## 2013-03-13 ENCOUNTER — Non-Acute Institutional Stay (HOSPITAL_COMMUNITY)
Admission: AD | Admit: 2013-03-13 | Discharge: 2013-03-13 | Disposition: A | Discharge status: Home / Self Care | Payer: Medicare Other | Attending: Internal Medicine | Admitting: Internal Medicine

## 2013-03-13 DIAGNOSIS — D571 Sickle-cell disease without crisis: Secondary | ICD-10-CM | POA: Diagnosis not present

## 2013-03-13 DIAGNOSIS — R52 Pain, unspecified: Secondary | ICD-10-CM | POA: Diagnosis not present

## 2013-03-13 DIAGNOSIS — D5700 Hb-SS disease with crisis, unspecified: Secondary | ICD-10-CM | POA: Diagnosis not present

## 2013-03-13 LAB — ETHANOL

## 2013-03-13 MED ORDER — HYDROMORPHONE HCL PF 2 MG/ML IJ SOLN
1.8000 mg | Freq: Once | INTRAMUSCULAR | Status: AC
  Administered 2013-03-13: 1.8 mg via INTRAVENOUS
  Filled 2013-03-13: qty 1

## 2013-03-13 MED ORDER — DEXTROSE-NACL 5-0.45 % IV SOLN
INTRAVENOUS | Status: DC
  Administered 2013-03-13: 10:00:00 via INTRAVENOUS

## 2013-03-13 MED ORDER — POTASSIUM CHLORIDE CRYS ER 20 MEQ PO TBCR
20.0000 meq | EXTENDED_RELEASE_TABLET | Freq: Every morning | ORAL | Status: DC
  Administered 2013-03-13: 20 meq via ORAL
  Filled 2013-03-13: qty 1

## 2013-03-13 MED ORDER — DIPHENHYDRAMINE HCL 12.5 MG/5ML PO ELIX: 12.5000 mg | ORAL_SOLUTION | Freq: Four times a day (QID) | ORAL | Status: DC | PRN

## 2013-03-13 MED ORDER — SODIUM CHLORIDE 0.9 % IJ SOLN
10.0000 mL | INTRAMUSCULAR | Status: AC | PRN
Start: 2013-03-13 — End: 2013-03-13
  Administered 2013-03-13: 10 mL

## 2013-03-13 MED ORDER — DIPHENHYDRAMINE HCL 50 MG/ML IJ SOLN: 12.5000 mg | Freq: Four times a day (QID) | INTRAMUSCULAR | Status: DC | PRN

## 2013-03-13 MED ORDER — NALOXONE HCL 0.4 MG/ML IJ SOLN: 0.4000 mg | INTRAMUSCULAR | Status: DC | PRN

## 2013-03-13 MED ORDER — HYDROMORPHONE 0.3 MG/ML IV SOLN
INTRAVENOUS | Status: DC
  Administered 2013-03-13: 6.39 mg via INTRAVENOUS
  Administered 2013-03-13: 6.97 mg via INTRAVENOUS
  Administered 2013-03-13 (×3): via INTRAVENOUS
  Administered 2013-03-13: 7.63 mg via INTRAVENOUS
  Filled 2013-03-13 (×3): qty 25

## 2013-03-13 MED ORDER — KETOROLAC TROMETHAMINE 30 MG/ML IJ SOLN
30.0000 mg | Freq: Four times a day (QID) | INTRAMUSCULAR | Status: DC
  Administered 2013-03-13 (×2): 30 mg via INTRAVENOUS
  Filled 2013-03-13 (×2): qty 1

## 2013-03-13 MED ORDER — FOLIC ACID 1 MG PO TABS
1.0000 mg | ORAL_TABLET | Freq: Every morning | ORAL | Status: DC
  Administered 2013-03-13: 1 mg via ORAL
  Filled 2013-03-13: qty 1

## 2013-03-13 MED ORDER — HEPARIN SOD (PORK) LOCK FLUSH 100 UNIT/ML IV SOLN
500.0000 [IU] | INTRAVENOUS | Status: AC | PRN
  Administered 2013-03-13: 500 [IU]
  Filled 2013-03-13: qty 5

## 2013-03-13 MED ORDER — RIVAROXABAN 20 MG PO TABS
20.0000 mg | ORAL_TABLET | Freq: Every morning | ORAL | Status: DC
  Administered 2013-03-13: 20 mg via ORAL
  Filled 2013-03-13: qty 1

## 2013-03-13 MED ORDER — SODIUM CHLORIDE 0.9 % IJ SOLN
INTRAMUSCULAR | Status: AC
  Administered 2013-03-13: 13:00:00
  Filled 2013-03-13: qty 10

## 2013-03-13 MED ORDER — ONDANSETRON HCL 4 MG/2ML IJ SOLN: 4.0000 mg | Freq: Four times a day (QID) | INTRAMUSCULAR | Status: DC | PRN

## 2013-03-13 MED ORDER — HYDROXYUREA 500 MG PO CAPS
500.0000 mg | ORAL_CAPSULE | Freq: Two times a day (BID) | ORAL | Status: DC
  Administered 2013-03-13: 500 mg via ORAL
  Filled 2013-03-13: qty 1

## 2013-03-13 MED ORDER — MORPHINE SULFATE ER 15 MG PO TBCR
15.0000 mg | EXTENDED_RELEASE_TABLET | Freq: Two times a day (BID) | ORAL | Status: DC
  Administered 2013-03-13: 15 mg via ORAL
  Filled 2013-03-13: qty 1

## 2013-03-13 MED ORDER — HYDROMORPHONE HCL PF 2 MG/ML IJ SOLN
2.1000 mg | Freq: Once | INTRAMUSCULAR | Status: AC
  Administered 2013-03-13: 2.1 mg via INTRAVENOUS
  Filled 2013-03-13: qty 2

## 2013-03-13 MED ORDER — SODIUM CHLORIDE 0.9 % IJ SOLN: 9.0000 mL | INTRAMUSCULAR | Status: DC | PRN

## 2013-03-13 MED ORDER — HYDROMORPHONE HCL PF 2 MG/ML IJ SOLN
1.5000 mg | Freq: Once | INTRAMUSCULAR | Status: AC
  Administered 2013-03-13: 1.5 mg via INTRAVENOUS
  Filled 2013-03-13: qty 1

## 2013-03-13 NOTE — Discharge Instructions (Signed)
Sickle Cell Anemia, Adult °Sickle cell anemia is a condition in which red blood cells have an abnormal "sickle" shape. This abnormal shape shortens the cells' life span, which results in a lower than normal concentration of red blood cells in the blood. The sickle shape also causes the cells to clump together and block free blood flow through the blood vessels. As a result, the tissues and organs of the body do not receive enough oxygen. Sickle cell anemia causes organ damage and pain and increases the risk of infection. °CAUSES  °Sickle cell anemia is a genetic disorder. Those who receive two copies of the gene have the condition, and those who receive one copy have the trait. °RISK FACTORS °The sickle cell gene is most common in people whose families originated in [place]. Other areas of the globe where sickle cell trait occurs include the Mediterranean, [place] and [place], the [place], and the [place].  °SIGNS AND SYMPTOMS °· Pain, especially in the extremities, back, chest, or abdomen (common). The pain may start suddenly or may develop following an illness, especially if there is dehydration. Pain can also occur due to overexertion or exposure to extreme temperature changes. °· Frequent severe bacterial infections, especially certain types of pneumonia and meningitis. °· Pain and swelling in the hands and feet. °· Decreased activity.   °· Loss of appetite.   °· Change in behavior. °· Headaches. °· Seizures. °· Shortness of breath or difficulty breathing. °· Vision changes. °· Skin ulcers. °Those with the trait may not have symptoms or they may have mild symptoms.  °DIAGNOSIS  °Sickle cell anemia is diagnosed with blood tests that demonstrate the genetic trait. It is often diagnosed during the newborn period, due to mandatory testing nationwide. A variety of blood tests, X-rays, CT scans, MRI scans, ultrasounds, and lung function tests may also be done to monitor the condition. °TREATMENT  °Sickle  cell anemia may be treated with: °· Medicines. You may be given pain medicines, antibiotic medicines (to treat and prevent infections) or medicines to increase the production of certain types of hemoglobin. °· Fluids. °· Oxygen. °· Blood transfusions. °HOME CARE INSTRUCTIONS  °· Drink enough fluid to keep your urine clear or pale yellow. Increase your fluid intake in hot weather and during exercise. °· Do not smoke. Smoking lowers oxygen levels in the blood.   °· Only take over-the-counter or prescription medicines for pain, fever, or discomfort as directed by your health care provider. °· Take antibiotics as directed by your health care provider. Make sure you finish them it even if you start to feel better.   °· Take supplements as directed by your health care provider.   °· Consider wearing a medical alert bracelet. This tells anyone caring for you in an emergency of your condition.   °· When traveling, keep your medical information, health care provider's names, and the medicines you take with you at all times.   °· If you develop a fever, do not take medicines to reduce the fever right away. This could cover up a problem that is developing. Notify your health care provider. °· Keep all follow-up appointments with your health care provider. Sickle cell anemia requires regular medical care. °SEEK MEDICAL CARE IF: ° You have a fever. °SEEK IMMEDIATE MEDICAL CARE IF:  °· You feel dizzy or faint.   °· You have new abdominal pain, especially on the left side near the stomach area.   °· You develop a persistent, often uncomfortable and painful penile erection (priapism). If this is not treated immediately it   will lead to impotence.   °· You have numbness your arms or legs or you have a hard time moving them.   °· You have a hard time with speech.   °· You have a fever or persistent symptoms for more than 2 3 days.   °· You have a fever and your symptoms suddenly get worse.   °· You have signs or symptoms of infection.  These include:   °· Chills.   °· Abnormal tiredness (lethargy).   °· Irritability.   °· Poor eating.   °· Vomiting.   °· You develop pain that is not helped with medicine.   °· You develop shortness of breath. °· You have pain in your chest.   °· You are coughing up pus-like or bloody sputum.   °· You develop a stiff neck. °· Your feet or hands swell or have pain. °· Your abdomen appears bloated. °· You develop joint pain. °MAKE SURE YOU: °· Understand these instructions. °· Will watch your child's condition. °· Will get help right away if your child is not doing well or gets worse. °Document Released: 04/27/2005 Document Revised: 11/07/2012 Document Reviewed: 08/29/2012 °[employer]® Patient Information ©2014 [employer]. ° °

## 2013-03-13 NOTE — Progress Notes (Signed)
Patient discharge home. VSS, patient in no apparent distress. Discharge instructions reviewed, patient acknowledged. Patient left day hospital with belongings ambulatory via self.

## 2013-03-13 NOTE — Discharge Summary (Signed)
[facility] Discharge Summary   Patient ID: [name] MRN: [id number] DOB/AGE: Jun 19, 1980 33 y.o.  Admit date: 03/13/2013 Discharge date: 03/13/2013  Primary Care Physician:  [name],[name], MD  Admission Diagnoses:  Active Problems:   Sickle cell pain  Physician Discharge Summary  [name]:[id number] DOB: Sep 15, 1980 DOA: 03/13/2013  PCP: [name],[name], MD  Admit date: 03/13/2013 Discharge date: 03/13/2013  Discharge Diagnoses:  Sickle Cell Anemia with pain  Discharge Condition: Stable   Disposition:    Diet:Regular Wt Readings from Last 3 Encounters:  03/11/13 172 lb (78.019 kg)  02/25/13 172 lb (78.019 kg)  02/13/13 177 lb (80.287 kg)    History of present illness:  Sickle Cell Pain Crisis  [facility] Course:  Pt was treated with IVF, Toradol and IV analgesics. Pt was initially treated with weight based rapid re-dosing and then transitioned to a weight based PCA Dilaudid. He has total use of 20.99 with 29 demands and 27 deliveries. Pain has decreased from 9/10 to 4/10. Pt will be discharged home on previously prescribed medications. Course outlined and reviewed with Dr. Zigmund Daniel.   Discharge Exam:  Filed Vitals:   03/13/13 1704  BP: 136/88  Pulse: 93  Temp: 98.8 F (37.1 C)  Resp: 24   Filed Vitals:   03/13/13 1433 03/13/13 1540 03/13/13 1613 03/13/13 1704  BP: 134/79 133/82  136/88  Pulse: 85 89  93  Temp: 98.7 F (37.1 C) 98.8 F (37.1 C)  98.8 F (37.1 C)  TempSrc: Oral Oral  Oral  Resp: 20 22 18 24   SpO2: 92% 91% 93% 92%   BP 136/88  Pulse 93  Temp(Src) 98.8 F (37.1 C) (Oral)  Resp 24  SpO2 92%  General Appearance:    Alert, cooperative, no distress, appears stated age  Head:    Normocephalic, without obvious abnormality, atraumatic  Eyes:    PERRL, conjunctiva/corneas clear, EOM's intact, fundi    benign, both eyes       Ears:    Normal TM's and external ear canals, both ears   Nose:   Nares normal, septum midline, mucosa normal, no drainage    or sinus tenderness  Throat:   Lips, mucosa, and tongue normal; teeth and gums normal  Neck:   Supple, symmetrical, trachea midline, no adenopathy;       thyroid:  No enlargement/tenderness/nodules; no carotid   bruit or JVD  Back:     Symmetric, no curvature, ROM normal, no CVA tenderness  Lungs:     Clear to auscultation bilaterally, respirations unlabored  Chest wall:    No tenderness or deformity  Heart:    Regular rate and rhythm, S1 and S2 normal, no murmur, rub   or gallop  Abdomen:     Soft, non-tender, bowel sounds active all four quadrants,    no masses, no organomegaly        Extremities:   Extremities normal, atraumatic, no cyanosis or edema  Pulses:   2+ and symmetric all extremities  Skin:   Skin color, texture, turgor normal, no rashes or lesions  Lymph nodes:   Cervical, supraclavicular, and axillary nodes normal  Neurologic:   CNII-XII intact. Normal strength, sensation and reflexes      throughout   Discharge Instructions   Future Appointments Provider Department Dept Phone   05/15/2013 10:00 AM Dorena Dew, [place] [phone]       Medication List    ASK  your doctor about these medications       celecoxib 200 MG capsule  Commonly known as:  CELEBREX  Take 1 capsule (200 mg total) by mouth 2 (two) times daily.     folic acid 1 MG tablet  Commonly known as:  FOLVITE  Take 1 tablet (1 mg total) by mouth every morning.     HYDROmorphone 4 MG tablet  Commonly known as:  DILAUDID  Take 1 tablet (4 mg total) by mouth every 4 (four) hours as needed for severe pain.     hydroxyurea 500 MG capsule  Commonly known as:  HYDREA  Take 500 mg by mouth 2 (two) times daily with a meal. May take with food to minimize GI side effects.     morphine 15 MG 12 hr tablet  Commonly known as:  MS CONTIN  Take 1 tablet (15 mg total) by mouth 2 (two) times daily.      potassium chloride SA 20 MEQ tablet  Commonly known as:  K-DUR,KLOR-CON  Take 1 tablet (20 mEq total) by mouth every morning.     Rivaroxaban 20 MG Tabs tablet  Commonly known as:  XARELTO  Take 1 tablet (20 mg total) by mouth every morning.     zolpidem 10 MG tablet  Commonly known as:  AMBIEN  Take 1 tablet (10 mg total) by mouth at bedtime as needed for sleep.          The results of significant diagnostics from this hospitalization (including imaging, microbiology, ancillary and laboratory) are listed below for reference.    Significant Diagnostic Studies: No results found.  Microbiology: No results found for this or any previous visit (from the past 240 hour(s)).   Labs: Basic Metabolic Panel:  Recent Labs Lab 03/11/13 0808 03/11/13 1505  NA 140 140  K 3.1* 3.5*  CL 104 106  CO2 23 22  GLUCOSE 104* 125*  BUN 8 5*  CREATININE 0.59 0.54  CALCIUM 8.5 8.6  MG  --  1.5   Liver Function Tests:  Recent Labs Lab 03/11/13 0808 03/11/13 1505  AST 28 26  ALT 18 18  ALKPHOS 91 89  BILITOT 6.3* 5.8*  PROT 6.9 7.6  ALBUMIN 3.8 4.0   No results found for this basename: LIPASE, AMYLASE,  in the last 168 hours No results found for this basename: AMMONIA,  in the last 168 hours CBC:  Recent Labs Lab 03/11/13 0808  WBC 16.1*  NEUTROABS 10.7*  HGB 8.6*  HCT 24.5*  MCV 92.8  PLT 473*   Cardiac Enzymes: No results found for this basename: CKTOTAL, CKMB, CKMBINDEX, TROPONINI,  in the last 168 hours BNP: No components found with this basename: POCBNP,  CBG: No results found for this basename: GLUCAP,  in the last 168 hours Ferritin: No results found for this basename: FERRITIN,  in the last 168 hours  Time coordinating discharge: Greater than 30 minutes  Signed:  Michiah [name] M  03/13/2013, 5:33 PM   Discharge Diagnoses:  Sickle cell anemia with pain  Discharge Medications:    Medication List    ASK your doctor about these medications        celecoxib 200 MG capsule  Commonly known as:  CELEBREX  Take 1 capsule (200 mg total) by mouth 2 (two) times daily.     folic acid 1 MG tablet  Commonly known as:  FOLVITE  Take 1 tablet (1 mg total) by mouth every morning.     HYDROmorphone 4  MG tablet  Commonly known as:  DILAUDID  Take 1 tablet (4 mg total) by mouth every 4 (four) hours as needed for severe pain.     hydroxyurea 500 MG capsule  Commonly known as:  HYDREA  Take 500 mg by mouth 2 (two) times daily with a meal. May take with food to minimize GI side effects.     morphine 15 MG 12 hr tablet  Commonly known as:  MS CONTIN  Take 1 tablet (15 mg total) by mouth 2 (two) times daily.     potassium chloride SA 20 MEQ tablet  Commonly known as:  K-DUR,KLOR-CON  Take 1 tablet (20 mEq total) by mouth every morning.     Rivaroxaban 20 MG Tabs tablet  Commonly known as:  XARELTO  Take 1 tablet (20 mg total) by mouth every morning.     zolpidem 10 MG tablet  Commonly known as:  AMBIEN  Take 1 tablet (10 mg total) by mouth at bedtime as needed for sleep.         Consults:  None  Significant Diagnostic Studies:  No results found     Disposition at Discharge: 01-Home or Self Care  Discharge Orders:      Future Appointments Provider Department Dept Phone   05/15/2013 10:00 AM Dorena Dew, [place] [phone]      Condition at Discharge:   Stable  Time spent on Discharge:  Greater than 30 minutes.  Signed: Allison Silva M 03/13/2013, 5:32 PM

## 2013-03-13 NOTE — Progress Notes (Signed)
Post weight based rapid redosing medication, patient reports pain at 6/10. NP notified. NP acknowledged and to place orders for PCA. To continue to monitor.

## 2013-03-13 NOTE — Telephone Encounter (Signed)
Received patient call. Patient requesting to be seen at clinic. Patient c/o pain to all upper body, rating pain 8/10. Patient denies all other pertinent ROS per [facility] Triage Assessment. Informed patient that this RN will notify MD on call and patient is to receive a return call back with Doctor's recommendation. Patient acknowledges.

## 2013-03-13 NOTE — Telephone Encounter (Signed)
Returned call to patient. Instructed patient to come to day hospital. Patient acknowledges.  

## 2013-03-20 ENCOUNTER — Inpatient Hospital Stay (HOSPITAL_COMMUNITY)
Admission: AD | Admit: 2013-03-20 | Discharge: 2013-03-30 | DRG: 193 | Disposition: A | Discharge status: Home / Self Care | Payer: Medicare Other | Attending: Internal Medicine | Admitting: Internal Medicine

## 2013-03-20 ENCOUNTER — Telehealth (HOSPITAL_COMMUNITY): Payer: Self-pay | Admitting: *Deleted

## 2013-03-20 ENCOUNTER — Encounter (HOSPITAL_COMMUNITY): Payer: Self-pay | Admitting: Hematology

## 2013-03-20 DIAGNOSIS — I10 Essential (primary) hypertension: Secondary | ICD-10-CM | POA: Diagnosis present

## 2013-03-20 DIAGNOSIS — F39 Unspecified mood [affective] disorder: Secondary | ICD-10-CM | POA: Diagnosis present

## 2013-03-20 DIAGNOSIS — M8700 Idiopathic aseptic necrosis of unspecified bone: Secondary | ICD-10-CM | POA: Diagnosis not present

## 2013-03-20 DIAGNOSIS — Z22322 Carrier or suspected carrier of Methicillin resistant Staphylococcus aureus: Secondary | ICD-10-CM

## 2013-03-20 DIAGNOSIS — J189 Pneumonia, unspecified organism: Secondary | ICD-10-CM | POA: Diagnosis not present

## 2013-03-20 DIAGNOSIS — E876 Hypokalemia: Secondary | ICD-10-CM | POA: Diagnosis present

## 2013-03-20 DIAGNOSIS — E860 Dehydration: Secondary | ICD-10-CM | POA: Diagnosis not present

## 2013-03-20 DIAGNOSIS — D5700 Hb-SS disease with crisis, unspecified: Secondary | ICD-10-CM

## 2013-03-20 DIAGNOSIS — D571 Sickle-cell disease without crisis: Secondary | ICD-10-CM

## 2013-03-20 DIAGNOSIS — Z7901 Long term (current) use of anticoagulants: Secondary | ICD-10-CM

## 2013-03-20 DIAGNOSIS — K5900 Constipation, unspecified: Secondary | ICD-10-CM | POA: Diagnosis present

## 2013-03-20 DIAGNOSIS — I2782 Chronic pulmonary embolism: Secondary | ICD-10-CM | POA: Diagnosis present

## 2013-03-20 DIAGNOSIS — R0902 Hypoxemia: Secondary | ICD-10-CM

## 2013-03-20 DIAGNOSIS — F17200 Nicotine dependence, unspecified, uncomplicated: Secondary | ICD-10-CM | POA: Diagnosis not present

## 2013-03-20 DIAGNOSIS — Z96649 Presence of unspecified artificial hip joint: Secondary | ICD-10-CM

## 2013-03-20 DIAGNOSIS — Z86711 Personal history of pulmonary embolism: Secondary | ICD-10-CM | POA: Diagnosis not present

## 2013-03-20 DIAGNOSIS — D72829 Elevated white blood cell count, unspecified: Secondary | ICD-10-CM

## 2013-03-20 DIAGNOSIS — D5701 Hb-SS disease with acute chest syndrome: Secondary | ICD-10-CM | POA: Diagnosis not present

## 2013-03-20 DIAGNOSIS — IMO0002 Reserved for concepts with insufficient information to code with codable children: Secondary | ICD-10-CM | POA: Diagnosis not present

## 2013-03-20 DIAGNOSIS — E559 Vitamin D deficiency, unspecified: Secondary | ICD-10-CM

## 2013-03-20 DIAGNOSIS — Z79899 Other long term (current) drug therapy: Secondary | ICD-10-CM

## 2013-03-20 DIAGNOSIS — R079 Chest pain, unspecified: Secondary | ICD-10-CM | POA: Diagnosis present

## 2013-03-20 DIAGNOSIS — R0789 Other chest pain: Secondary | ICD-10-CM

## 2013-03-20 DIAGNOSIS — R0989 Other specified symptoms and signs involving the circulatory and respiratory systems: Secondary | ICD-10-CM | POA: Diagnosis not present

## 2013-03-20 LAB — COMPREHENSIVE METABOLIC PANEL
ALT: 24 U/L (ref 0–53)
AST: 35 U/L (ref 0–37)
Albumin: 4.3 g/dL (ref 3.5–5.2)
Alkaline Phosphatase: 98 U/L (ref 39–117)
BUN: 5 mg/dL — AB (ref 6–23)
CO2: 22 mEq/L (ref 19–32)
CREATININE: 0.54 mg/dL (ref 0.50–1.35)
Calcium: 9.2 mg/dL (ref 8.4–10.5)
Chloride: 104 mEq/L (ref 96–112)
GLUCOSE: 110 mg/dL — AB (ref 70–99)
Potassium: 3.4 mEq/L — ABNORMAL LOW (ref 3.7–5.3)
SODIUM: 139 meq/L (ref 137–147)
TOTAL PROTEIN: 8 g/dL (ref 6.0–8.3)
Total Bilirubin: 6.2 mg/dL — ABNORMAL HIGH (ref 0.3–1.2)

## 2013-03-20 LAB — CBC WITH DIFFERENTIAL/PLATELET
BASOS PCT: 1 % (ref 0–1)
Basophils Absolute: 0.2 10*3/uL — ABNORMAL HIGH (ref 0.0–0.1)
Eosinophils Absolute: 0.4 10*3/uL (ref 0.0–0.7)
Eosinophils Relative: 2 % (ref 0–5)
HCT: 24.9 % — ABNORMAL LOW (ref 39.0–52.0)
HEMOGLOBIN: 8.7 g/dL — AB (ref 13.0–17.0)
LYMPHS PCT: 9 % — AB (ref 12–46)
Lymphs Abs: 1.8 10*3/uL (ref 0.7–4.0)
MCH: 32.3 pg (ref 26.0–34.0)
MCHC: 34.9 g/dL (ref 30.0–36.0)
MCV: 92.6 fL (ref 78.0–100.0)
MONOS PCT: 12 % (ref 3–12)
Monocytes Absolute: 2.4 10*3/uL — ABNORMAL HIGH (ref 0.1–1.0)
NEUTROS PCT: 76 % (ref 43–77)
Neutro Abs: 15.1 10*3/uL — ABNORMAL HIGH (ref 1.7–7.7)
Platelets: 485 10*3/uL — ABNORMAL HIGH (ref 150–400)
RBC: 2.69 MIL/uL — AB (ref 4.22–5.81)
RDW: 21.3 % — ABNORMAL HIGH (ref 11.5–15.5)
WBC: 19.9 10*3/uL — AB (ref 4.0–10.5)

## 2013-03-20 LAB — RETICULOCYTES: RBC.: 2.69 MIL/uL — AB (ref 4.22–5.81)

## 2013-03-20 MED ORDER — DIPHENHYDRAMINE HCL 50 MG/ML IJ SOLN: 12.5000 mg | Freq: Four times a day (QID) | INTRAMUSCULAR | Status: DC | PRN

## 2013-03-20 MED ORDER — FOLIC ACID 1 MG PO TABS
1.0000 mg | ORAL_TABLET | Freq: Every morning | ORAL | Status: DC
  Administered 2013-03-21 – 2013-03-30 (×10): 1 mg via ORAL
  Filled 2013-03-20 (×11): qty 1

## 2013-03-20 MED ORDER — HYDROMORPHONE 0.3 MG/ML IV SOLN
INTRAVENOUS | Status: DC
  Administered 2013-03-20: 12:00:00 via INTRAVENOUS
  Administered 2013-03-20: 3.99 mg via INTRAVENOUS
  Administered 2013-03-20: 18:00:00 via INTRAVENOUS
  Administered 2013-03-20: 0.01 mg via INTRAVENOUS
  Administered 2013-03-20: 14:00:00 via INTRAVENOUS
  Administered 2013-03-20: 6.95 mg via INTRAVENOUS
  Administered 2013-03-20: 7.49 mg via INTRAVENOUS
  Administered 2013-03-21 (×3): via INTRAVENOUS
  Administered 2013-03-21: 11.6 mg via INTRAVENOUS
  Administered 2013-03-21: 05:00:00 via INTRAVENOUS
  Administered 2013-03-21: 12.85 mg via INTRAVENOUS
  Administered 2013-03-21: 7.52 mg via INTRAVENOUS
  Administered 2013-03-21: 6.01 mg via INTRAVENOUS
  Administered 2013-03-21 – 2013-03-22 (×4): via INTRAVENOUS
  Administered 2013-03-22: 11.22 mg via INTRAVENOUS
  Filled 2013-03-20 (×13): qty 25

## 2013-03-20 MED ORDER — ONDANSETRON HCL 4 MG PO TABS
4.0000 mg | ORAL_TABLET | ORAL | Status: DC | PRN
  Administered 2013-03-20: 4 mg via ORAL
  Filled 2013-03-20: qty 1

## 2013-03-20 MED ORDER — NALOXONE HCL 0.4 MG/ML IJ SOLN: 0.4000 mg | INTRAMUSCULAR | Status: DC | PRN

## 2013-03-20 MED ORDER — ZOLPIDEM TARTRATE 5 MG PO TABS
5.0000 mg | ORAL_TABLET | Freq: Once | ORAL | Status: AC
  Administered 2013-03-20: 5 mg via ORAL
  Filled 2013-03-20: qty 1

## 2013-03-20 MED ORDER — HYDROXYUREA 500 MG PO CAPS
500.0000 mg | ORAL_CAPSULE | Freq: Two times a day (BID) | ORAL | Status: DC
  Administered 2013-03-21 – 2013-03-30 (×19): 500 mg via ORAL
  Filled 2013-03-20 (×25): qty 1

## 2013-03-20 MED ORDER — SENNA 8.6 MG PO TABS
1.0000 | ORAL_TABLET | Freq: Every day | ORAL | Status: DC
  Administered 2013-03-20 – 2013-03-29 (×4): 8.6 mg via ORAL
  Filled 2013-03-20 (×8): qty 1

## 2013-03-20 MED ORDER — RIVAROXABAN 20 MG PO TABS
20.0000 mg | ORAL_TABLET | Freq: Every day | ORAL | Status: DC
  Administered 2013-03-21 – 2013-03-30 (×10): 20 mg via ORAL
  Filled 2013-03-20 (×12): qty 1

## 2013-03-20 MED ORDER — DEXTROSE-NACL 5-0.45 % IV SOLN
INTRAVENOUS | Status: DC
  Administered 2013-03-20 – 2013-03-21 (×4): via INTRAVENOUS

## 2013-03-20 MED ORDER — ONDANSETRON HCL 4 MG/2ML IJ SOLN
4.0000 mg | INTRAMUSCULAR | Status: DC | PRN
Start: 2013-03-20 — End: 2013-03-30

## 2013-03-20 MED ORDER — HYDROMORPHONE HCL PF 2 MG/ML IJ SOLN
2.5000 mg | Freq: Once | INTRAMUSCULAR | Status: AC
  Administered 2013-03-20: 2.5 mg via INTRAVENOUS
  Filled 2013-03-20: qty 2

## 2013-03-20 MED ORDER — MORPHINE SULFATE ER 15 MG PO TBCR
15.0000 mg | EXTENDED_RELEASE_TABLET | Freq: Two times a day (BID) | ORAL | Status: DC
  Administered 2013-03-20 – 2013-03-30 (×20): 15 mg via ORAL
  Filled 2013-03-20 (×20): qty 1

## 2013-03-20 MED ORDER — SODIUM CHLORIDE 0.9 % IV SOLN
25.0000 mg | INTRAVENOUS | Status: DC | PRN
  Administered 2013-03-20 – 2013-03-29 (×21): 25 mg via INTRAVENOUS
  Filled 2013-03-20 (×24): qty 0.5

## 2013-03-20 MED ORDER — HYDROMORPHONE HCL PF 2 MG/ML IJ SOLN
3.0000 mg | Freq: Once | INTRAMUSCULAR | Status: AC
  Administered 2013-03-20: 3 mg via INTRAVENOUS
  Filled 2013-03-20: qty 2

## 2013-03-20 MED ORDER — HYDROMORPHONE HCL PF 2 MG/ML IJ SOLN
2.0000 mg | Freq: Once | INTRAMUSCULAR | Status: AC
Start: 2013-03-20 — End: 2013-03-20
  Administered 2013-03-20: 2 mg via INTRAVENOUS
  Filled 2013-03-20: qty 1

## 2013-03-20 MED ORDER — SODIUM CHLORIDE 0.9 % IJ SOLN
INTRAMUSCULAR | Status: AC
  Administered 2013-03-20: 12:00:00
  Filled 2013-03-20: qty 10

## 2013-03-20 MED ORDER — SODIUM CHLORIDE 0.9 % IJ SOLN: 9.0000 mL | INTRAMUSCULAR | Status: DC | PRN

## 2013-03-20 NOTE — Progress Notes (Signed)
Patient transferred with O2 to room 1320 via wheelchair escorted by RN. Patient resting quietly at bedside. Report given at bedside to [facility].

## 2013-03-20 NOTE — Telephone Encounter (Signed)
Returned call to patient. Instructed patient to come to day hospital. Patient acknowledges.  

## 2013-03-20 NOTE — Progress Notes (Signed)
Patient oxygen saturation down to 88-89% on room air.  Patient instructed to take deep breaths, at which time saturations recovered to 90%.  Patient denies any chest pain, shortness of breath or difficulty breathing.  Oxygen saturation back down to 89% while this nurse was at the beside.  O2 place at 1L via nasal cannula.  Oxygen saturation 93%1L.  Patient resting comfortably using PCA for pain.  This RN will continue to monitor patient.  [place], NP notified verbally

## 2013-03-20 NOTE — H&P (Signed)
Sickle [name] History and Physical   Date: 03/20/2013  Patient name: [name] Medical record number: [id number] Date of birth: Jul 06, 1980 Age: 33 y.o. Gender: male PCP: Gerald A., MD  Attending physician: Gerald Gamer, MD  Chief Complaint: Sickle Cell Crisis with pain  History of Present Illness: Patient presented with pain primarily to left flank and abdomen for 6 days that is consistent with previous sickle cell pain crisis. [facility] 9/10 pain described as throbbing and constant. Maintains that he took previously prescribed medications to alleviate pain at 5:30 am with minimal relief. He denies any fever,nausea,  vomiting or diarrhea. It appears this is a manifestation of his sickle cell anemia.  Meds: Prescriptions prior to admission  Medication Sig Dispense Refill  . celecoxib (CELEBREX) 200 MG capsule Take 1 capsule (200 mg total) by mouth 2 (two) times daily.  60 capsule  1  . folic acid (FOLVITE) 1 MG tablet Take 1 tablet (1 mg total) by mouth every morning.  30 tablet  11  . HYDROmorphone (DILAUDID) 4 MG tablet Take 1 tablet (4 mg total) by mouth every 4 (four) hours as needed for severe pain.  90 tablet  0  . hydroxyurea (HYDREA) 500 MG capsule Take 500 mg by mouth 2 (two) times daily with a meal. May take with food to minimize GI side effects.      Marland Kitchen morphine (MS CONTIN) 15 MG 12 hr tablet Take 1 tablet (15 mg total) by mouth 2 (two) times daily.  60 tablet  0  . potassium chloride SA (K-DUR,KLOR-CON) 20 MEQ tablet Take 1 tablet (20 mEq total) by mouth every morning.  30 tablet  3  . Rivaroxaban (XARELTO) 20 MG TABS tablet Take 1 tablet (20 mg total) by mouth every morning.  30 tablet  2  . zolpidem (AMBIEN) 10 MG tablet Take 1 tablet (10 mg total) by mouth at bedtime as needed for sleep.  30 tablet  2    Allergies: Morphine and related Past Medical History  Diagnosis Date  . Sickle cell anemia    . Blood transfusion   . 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 Hypokalemia   . Mood disorder   . Pulmonary embolism   . Avascular necrosis   . Leukocytosis     Chronic  . Thrombocytosis     Chronic  . Hypertension   . C. difficile colitis   . Uses marijuana   . Chronic anticoagulation   . Functional asplenia    Past Surgical History  Procedure Laterality Date  . Right hip replacement      08/2006  . Cholecystectomy      01/2008  . Porta cath placement    . Porta cath removal    . Umbilical hernia repair      01/2008  . Excision of left periauricular cyst      10/2009  . Excision of right ear lobe cyst with primary closur      11/2007  . Portacath placement  01/05/2012    Procedure: INSERTION PORT-A-CATH;  Surgeon: Gerald Hollingshead, MD;  Location: [place];  Service: General;  Laterality: N/A;  ultrasound guiced port a cath insertion with fluoroscopy   Family History  Problem Relation Age of Onset  . Sickle cell anemia Mother   . Sickle cell anemia Father   . Sickle cell trait Brother    History   Social  History  . Marital Status: Single    Spouse Name: N/A    Number of Children: 0  . Years of Education: 13   Occupational History  . 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 Smoking status: Former Smoker -- 13 years    Types: Cigarettes    Quit date: 07/08/2010  . Smokeless tobacco: Never Used  . Alcohol Use: 6.0 oz/week    10 Shots of liquor per week  . Drug Use: Yes    Special: Marijuana     Comment: quit smoking 2011  . Sexual Activity: Yes    Partners: Female    Patent examiner Protection: None   Other Topics Concern  . Not on file   Social History Narrative   Lives in an apartment.  Single.  Lives alone but has a girlfriend that helps care for him.  Does not use any assist devices.        [name]:  [phone] Mom, emergency contact    Review of Systems:    Constitutional: No weight loss, night sweats, Fevers, chills, fatigue.  HEENT: No headaches, dizziness, seizures, vision changes, difficulty swallowing,Tooth/dental problems,Sore throat, No sneezing, itching, ear ache, nasal congestion, post nasal drip,  Cardio-vascular: No chest pain, Orthopnea, PND, swelling in lower extremities, anasarca, dizziness, palpitations  GI: No heartburn, indigestion, nausea, vomiting, diarrhea, change in bowel habits, loss of appetite. Abdominal discomfort.  Resp: No shortness of breath with exertion or at rest. No excess mucus, no productive cough, No non-productive cough, No coughing up of blood.No change in color of mucus.No wheezing.No chest wall deformity  Skin: no rash or lesions.  GU: no dysuria, change in color of urine, no urgency or frequency. No flank pain.  Musculoskeletal: No joint pain or swelling. No decreased range of motion. No back pain. Left flank pain present.  Psych: No change in mood or affect. No depression or anxiety. No memory loss.      Physical Exam: Blood pressure 130/88, pulse 89, temperature 98.2 F (36.8 C), temperature source Oral, resp. rate 21, height 6' (1.829 m), weight 172 lb (78.019 kg), SpO2 95.00%. BP 130/88  Pulse 89  Temp(Src) 98.2 F (36.8 C) (Oral)  Resp 21  Ht 6' (1.829 m)  Wt 172 lb (78.019 kg)  BMI 23.32 kg/m2  SpO2 95%  General Appearance:    Alert, cooperative, no distress, appears stated age  Head:    Normocephalic, without obvious abnormality, atraumatic  Eyes:    PERRL, conjunctiva/corneas clear, EOM's intact, fundi    benign, both eyes       Ears:    Normal TM's and external ear canals, both ears  Nose:   Nares normal, septum midline, mucosa normal, no drainage    or sinus tenderness  Throat:   Lips, mucosa, and tongue normal; teeth and gums normal  Neck:   Supple, symmetrical, trachea midline, no adenopathy;       thyroid:  No enlargement/tenderness/nodules; no carotid   bruit or JVD  Back:      Symmetric, no curvature, ROM normal, no CVA tenderness  Lungs:     Clear to auscultation bilaterally, respirations unlabored  Chest wall:    No tenderness or deformity  Heart:    Regular rate and rhythm, S1 and S2 normal, no murmur, rub   or gallop  Abdomen:     Soft, non-tender, bowel sounds active all four quadrants,  no masses, no organomegaly. Left flank pain  Extremities:   Extremities normal, atraumatic, no cyanosis or edema  Pulses:   2+ and symmetric all extremities  Skin:   Skin color, texture, turgor normal, no rashes or lesions  Lymph nodes:   Cervical, supraclavicular, and axillary nodes normal  Neurologic:   CNII-XII intact. Normal strength, sensation and reflexes      throughout    Lab results: Results for orders placed during the hospital encounter of 03/20/13 (from the past 24 hour(s))  COMPREHENSIVE METABOLIC PANEL     Status: Abnormal   Collection Time    03/20/13 10:29 AM      Result Value Ref Range   Sodium 139  137 - 147 mEq/L   Potassium 3.4 (*) 3.7 - 5.3 mEq/L   Chloride 104  96 - 112 mEq/L   CO2 22  19 - 32 mEq/L   Glucose, Bld 110 (*) 70 - 99 mg/dL   BUN 5 (*) 6 - 23 mg/dL   Creatinine, Ser 0.54  0.50 - 1.35 mg/dL   Calcium 9.2  8.4 - 10.5 mg/dL   Total Protein 8.0  6.0 - 8.3 g/dL   Albumin 4.3  3.5 - 5.2 g/dL   AST 35  0 - 37 U/L   ALT 24  0 - 53 U/L   Alkaline Phosphatase 98  39 - 117 U/L   Total Bilirubin 6.2 (*) 0.3 - 1.2 mg/dL   GFR calc non Af Amer >90  >90 mL/min   GFR calc Af Amer >90  >90 mL/min  CBC WITH DIFFERENTIAL     Status: Abnormal   Collection Time    03/20/13 10:29 AM      Result Value Ref Range   WBC 19.9 (*) 4.0 - 10.5 K/uL   RBC 2.69 (*) 4.22 - 5.81 MIL/uL   Hemoglobin 8.7 (*) 13.0 - 17.0 g/dL   HCT 24.9 (*) 39.0 - 52.0 %   MCV 92.6  78.0 - 100.0 fL   MCH 32.3  26.0 - 34.0 pg   MCHC 34.9  30.0 - 36.0 g/dL   RDW 21.3 (*) 11.5 - 15.5 %   Platelets 485 (*) 150 - 400 K/uL   Neutrophils Relative % 76  43 - 77 %   Lymphocytes  Relative 9 (*) 12 - 46 %   Monocytes Relative 12  3 - 12 %   Eosinophils Relative 2  0 - 5 %   Basophils Relative 1  0 - 1 %   Neutro Abs 15.1 (*) 1.7 - 7.7 K/uL   Lymphs Abs 1.8  0.7 - 4.0 K/uL   Monocytes Absolute 2.4 (*) 0.1 - 1.0 K/uL   Eosinophils Absolute 0.4  0.0 - 0.7 K/uL   Basophils Absolute 0.2 (*) 0.0 - 0.1 K/uL   RBC Morphology TARGET CELLS     Smear Review LARGE PLATELETS PRESENT    RETICULOCYTES     Status: Abnormal   Collection Time    03/20/13 10:29 AM      Result Value Ref Range   Retic Ct Pct >23.0 (*) 0.4 - 3.1 %   RBC. 2.69 (*) 4.22 - 5.81 MIL/uL   Retic Count, Manual NOT CALCULATED  19.0 - 186.0 K/uL    Imaging results:  No results found.   Assessment & Plan: Sickle cell crisis with pain: Patient unable to manage pain crisis with home medications. Pain prior to admission is 7/10. Pt was treated with IVF, Toradol and IV analgesics.  Pt was initially treated with weight based rapid re-dosing and then transitioned to a weight based PCA Dilaudid. He  has total use of 18.43 with  26 demands and 22 deliveries during extended observation.   Admit to inpatient unit, maintain weight based PCA per protocol, IVF, IV analgesics, IV Toradol, and specified home medications. Admission discussed with Dr. Zigmund Daniel.   Hollis,Lachina M 03/20/2013, 6:55 PM

## 2013-03-20 NOTE — Telephone Encounter (Signed)
Received patient call. Patient c/o side and stomach pain starting early this morning about 6 am, rating pain 9/10. Taken Dilaudid and MS contin about 530 this morning with no improvement. Patient reports vomiting this morning, reports stomach pain in middle of stomach. Patient denies all other pertinent ROS per [facility] Triage assessment. Informed patient that this RN will notify MD on call and patient is to receive a return call back with recommendation. Patient acknowledges.

## 2013-03-21 DIAGNOSIS — D5700 Hb-SS disease with crisis, unspecified: Secondary | ICD-10-CM | POA: Diagnosis not present

## 2013-03-21 NOTE — Care Management Note (Signed)
   CARE MANAGEMENT NOTE 03/21/2013  Patient:  [name], [name]   Account Number:  [id number]  Date Initiated:  03/21/2013  Documentation initiated by:  Emmy Keng  Subjective/Objective Assessment:   33 yo male admitted with sickle cell crisis. PCP: Dr.[name]     Action/Plan:   Home when stable   Anticipated DC Date:     Anticipated DC Plan:  [place]  CM consult      Choice offered to / List presented to:  NA   DME arranged  NA      DME agency  NA     [place] arranged  NA      [place] agency  NA   Status of service:  In process, will continue to follow Medicare Important Message given?   (If response is "NO", the following Medicare IM given date fields will be blank) Date Medicare IM given:   Date Additional Medicare IM given:    Discharge Disposition:    Per UR Regulation:  Reviewed for med. necessity/level of care/duration of stay  If discussed at [place] of Stay Meetings, dates discussed:    Comments:  03/21/13 [place] [phone] Chart review for utilization of services. No needs identified at this time. Identified PCP and pharmacy.

## 2013-03-21 NOTE — Progress Notes (Signed)
Subjective: A 33 yo admitted with acute sickle cell painful crisis. Has been on Dilaudid PCA and has used13 mg with 29 demands and 27 deliveries in the last 12 hours. Patient still asking for more pain medicines due to pain at 8/10. No fever, no NVD, no cough or shortness of breath.  Objective: Vital signs in last 24 hours: Temp:  [98.1 F (36.7 C)-99 F (37.2 C)] 98.1 F (36.7 C) (02/19 0630) Pulse Rate:  [84-104] 98 (02/19 0630) Resp:  [7-22] 7 (02/19 0752) BP: (120-153)/(73-90) 140/79 mmHg (02/19 0630) SpO2:  [89 %-99 %] 99 % (02/19 0752) Weight:  [76.023 kg (167 lb 9.6 oz)-78.019 kg (172 lb)] 76.023 kg (167 lb 9.6 oz) (02/19 0513) Weight change:  Last BM Date: 03/20/13  Intake/Output from previous day: 02/18 0701 - 02/19 0700 In: 1181.5 [I.V.:1181.5] Out: -  Intake/Output this shift:    General appearance: alert, cooperative, appears stated age and no distress Head: Normocephalic, without obvious abnormality, atraumatic Neck: no adenopathy, no carotid bruit, no JVD, supple, symmetrical, trachea midline and thyroid not enlarged, symmetric, no tenderness/mass/nodules Back: symmetric, no curvature. ROM normal. No CVA tenderness. Resp: clear to auscultation bilaterally Chest wall: no tenderness Cardio: regular rate and rhythm, S1, S2 normal, no murmur, click, rub or gallop GI: soft, non-tender; bowel sounds normal; no masses,  no organomegaly Extremities: extremities normal, atraumatic, no cyanosis or edema Pulses: 2+ and symmetric Skin: Skin color, texture, turgor normal. No rashes or lesions Neurologic: Grossly normal  Lab Results:  Recent Labs  03/20/13 1029  WBC 19.9*  HGB 8.7*  HCT 24.9*  PLT 485*   BMET  Recent Labs  03/20/13 1029  NA 139  K 3.4*  CL 104  CO2 22  GLUCOSE 110*  BUN 5*  CREATININE 0.54  CALCIUM 9.2    Studies/Results: No results found.  Medications: I have reviewed the patient's current medications.  Assessment/Plan: A 33 yo  admitted with Sickle Cell painful crisis.  #1 Sickle Cell Painful Crisis: Patient on Dilaudid PCA weight-based dosing. He has been encouraged to use it judiciously. Also on Toradol, hydration and supportive care. If symptoms do not improve in the next 24 hours, we will add a basal rate to his PCA.  #2 Sickle Cell Anemia: Will follow his H/H and chemistry for signs of hemolysis.  #3 Hypokalemia: Will replete.  #4 Hx of PE: Continue Xarelto.   LOS: 1 day   [name],[name] 03/21/2013, 8:50 AM

## 2013-03-21 NOTE — Progress Notes (Signed)
Patient stated that he has not had any pain relief with his current Dilaudid PCA.  Patient rating pain 10 out of 10.  Dr. Jonelle Sidle notified.  No changes made to current orders.  Patient is not using the maximum amount on his PCA.  Encouraged patient to press button when he needs pain medication.  Zandra Abts [facility]  03/21/2013

## 2013-03-22 ENCOUNTER — Inpatient Hospital Stay (HOSPITAL_COMMUNITY): Payer: Medicare Other

## 2013-03-22 DIAGNOSIS — E559 Vitamin D deficiency, unspecified: Secondary | ICD-10-CM

## 2013-03-22 DIAGNOSIS — Z7901 Long term (current) use of anticoagulants: Secondary | ICD-10-CM

## 2013-03-22 DIAGNOSIS — F17200 Nicotine dependence, unspecified, uncomplicated: Secondary | ICD-10-CM | POA: Diagnosis not present

## 2013-03-22 DIAGNOSIS — I2782 Chronic pulmonary embolism: Secondary | ICD-10-CM | POA: Diagnosis not present

## 2013-03-22 DIAGNOSIS — D72829 Elevated white blood cell count, unspecified: Secondary | ICD-10-CM

## 2013-03-22 DIAGNOSIS — E876 Hypokalemia: Secondary | ICD-10-CM | POA: Diagnosis not present

## 2013-03-22 DIAGNOSIS — M8700 Idiopathic aseptic necrosis of unspecified bone: Secondary | ICD-10-CM | POA: Diagnosis not present

## 2013-03-22 DIAGNOSIS — R0789 Other chest pain: Secondary | ICD-10-CM

## 2013-03-22 DIAGNOSIS — R0989 Other specified symptoms and signs involving the circulatory and respiratory systems: Secondary | ICD-10-CM | POA: Diagnosis not present

## 2013-03-22 DIAGNOSIS — D5700 Hb-SS disease with crisis, unspecified: Secondary | ICD-10-CM

## 2013-03-22 DIAGNOSIS — J189 Pneumonia, unspecified organism: Secondary | ICD-10-CM | POA: Diagnosis not present

## 2013-03-22 DIAGNOSIS — D5701 Hb-SS disease with acute chest syndrome: Secondary | ICD-10-CM | POA: Diagnosis not present

## 2013-03-22 DIAGNOSIS — R0902 Hypoxemia: Secondary | ICD-10-CM | POA: Diagnosis not present

## 2013-03-22 DIAGNOSIS — E860 Dehydration: Secondary | ICD-10-CM

## 2013-03-22 LAB — CBC WITH DIFFERENTIAL/PLATELET
BASOS ABS: 0.3 10*3/uL — AB (ref 0.0–0.1)
Basophils Relative: 1 % (ref 0–1)
Eosinophils Absolute: 0.3 10*3/uL (ref 0.0–0.7)
Eosinophils Relative: 1 % (ref 0–5)
HEMATOCRIT: 21.8 % — AB (ref 39.0–52.0)
Hemoglobin: 7.8 g/dL — ABNORMAL LOW (ref 13.0–17.0)
LYMPHS ABS: 2.4 10*3/uL (ref 0.7–4.0)
Lymphocytes Relative: 9 % — ABNORMAL LOW (ref 12–46)
MCH: 32.4 pg (ref 26.0–34.0)
MCHC: 35.8 g/dL (ref 30.0–36.0)
MCV: 90.5 fL (ref 78.0–100.0)
MONO ABS: 4.1 10*3/uL — AB (ref 0.1–1.0)
Monocytes Relative: 15 % — ABNORMAL HIGH (ref 3–12)
NEUTROS ABS: 20.1 10*3/uL — AB (ref 1.7–7.7)
Neutrophils Relative %: 74 % (ref 43–77)
PLATELETS: 461 10*3/uL — AB (ref 150–400)
RBC: 2.41 MIL/uL — ABNORMAL LOW (ref 4.22–5.81)
RDW: 20.8 % — ABNORMAL HIGH (ref 11.5–15.5)
WBC: 27.2 10*3/uL — ABNORMAL HIGH (ref 4.0–10.5)

## 2013-03-22 LAB — COMPREHENSIVE METABOLIC PANEL
ALBUMIN: 4.3 g/dL (ref 3.5–5.2)
ALT: 20 U/L (ref 0–53)
AST: 31 U/L (ref 0–37)
Alkaline Phosphatase: 90 U/L (ref 39–117)
BUN: 8 mg/dL (ref 6–23)
CALCIUM: 8.8 mg/dL (ref 8.4–10.5)
CHLORIDE: 101 meq/L (ref 96–112)
CO2: 21 mEq/L (ref 19–32)
Creatinine, Ser: 0.65 mg/dL (ref 0.50–1.35)
GFR calc Af Amer: >90 mL/min (ref >90)
Glucose, Bld: 130 mg/dL — ABNORMAL HIGH (ref 70–99)
Potassium: 3.6 mEq/L — ABNORMAL LOW (ref 3.7–5.3)
Sodium: 136 mEq/L — ABNORMAL LOW (ref 137–147)
Total Bilirubin: 8.1 mg/dL — ABNORMAL HIGH (ref 0.3–1.2)
Total Protein: 7.8 g/dL (ref 6.0–8.3)

## 2013-03-22 LAB — EXPECTORATED SPUTUM ASSESSMENT W GRAM STAIN, RFLX TO RESP C

## 2013-03-22 LAB — EXPECTORATED SPUTUM ASSESSMENT W REFEX TO RESP CULTURE: SPECIAL REQUESTS: NORMAL

## 2013-03-22 LAB — MAGNESIUM: Magnesium: 1.9 mg/dL (ref 1.5–2.5)

## 2013-03-22 MED ORDER — HYDROMORPHONE 0.3 MG/ML IV SOLN
INTRAVENOUS | Status: DC
  Administered 2013-03-22: 7.02 mg via INTRAVENOUS
  Administered 2013-03-22: 13:00:00 via INTRAVENOUS
  Filled 2013-03-22: qty 25

## 2013-03-22 MED ORDER — ZOLPIDEM TARTRATE 10 MG PO TABS
10.0000 mg | ORAL_TABLET | Freq: Every evening | ORAL | Status: DC | PRN
  Administered 2013-03-22 – 2013-03-28 (×3): 10 mg via ORAL
  Filled 2013-03-22 (×3): qty 1

## 2013-03-22 MED ORDER — LORAZEPAM 0.5 MG PO TABS
0.5000 mg | ORAL_TABLET | Freq: Once | ORAL | Status: AC
  Administered 2013-03-22: 0.5 mg via ORAL
  Filled 2013-03-22: qty 1

## 2013-03-22 MED ORDER — KETOROLAC TROMETHAMINE 15 MG/ML IJ SOLN
30.0000 mg | Freq: Four times a day (QID) | INTRAMUSCULAR | Status: DC
  Administered 2013-03-22 – 2013-03-25 (×11): 30 mg via INTRAVENOUS
  Filled 2013-03-22 (×18): qty 2

## 2013-03-22 MED ORDER — HYDROMORPHONE HCL PF 2 MG/ML IJ SOLN
2.0000 mg | INTRAMUSCULAR | Status: DC | PRN
  Administered 2013-03-22 (×3): 2 mg via INTRAVENOUS
  Filled 2013-03-22 (×3): qty 1

## 2013-03-22 MED ORDER — KCL IN DEXTROSE-NACL 40-5-0.9 MEQ/L-%-% IV SOLN
INTRAVENOUS | Status: DC
  Administered 2013-03-22 – 2013-03-27 (×7): via INTRAVENOUS
  Filled 2013-03-22 (×12): qty 1000

## 2013-03-22 MED ORDER — HYDROMORPHONE 0.3 MG/ML IV SOLN
INTRAVENOUS | Status: DC
  Administered 2013-03-22: 15:00:00 via INTRAVENOUS
  Administered 2013-03-22: 17.73 mg via INTRAVENOUS
  Administered 2013-03-22: 17:00:00 via INTRAVENOUS
  Administered 2013-03-22: 6.86 mg via INTRAVENOUS
  Administered 2013-03-22 – 2013-03-23 (×2): via INTRAVENOUS
  Filled 2013-03-22 (×4): qty 25

## 2013-03-22 NOTE — Progress Notes (Signed)
PHYSICIAN PROGRESS NOTE  [name]:[id number] DOB: 03/01/1980 DOA: 03/20/2013  03/22/2013   PCP: [name],[name], MD  Assessment/Plan: 1. Acute SS Crisis - pain uncontrolled at this time, pt not using PCA to maximum dose, has received 11.11 mg total and 18 demands, 15 deliveries, will add basal rate now, follow and adjust for better pain control. Also, adding toradol today for inflammation, continue IVFs, add incentive spirometry 2. Leukocytosis - pt coughed up some yellow sputum, will check 2 view CXR today, sputum culture, follow, no fever or chills.  3. Hypokalemia - add K to IVFs and follow, check Mg level 4. History of PE - continue Xarelto daily.   Code Status: FULL Family Communication: no family at bedside  Disposition Plan: Not ready for discharge  HPI/Subjective: Pt reports that pain is not controlled.  His pain is 8/10.  His pain is still in rib cage.  He coughed up some yellow sputum.    Objective: Filed Vitals:   03/22/13 1002  BP: 140/80  Pulse: 80  Temp: 98.5 F (36.9 C)  Resp: 20    Intake/Output Summary (Last 24 hours) at 03/22/13 1056 Last data filed at 03/22/13 1002  Gross per 24 hour  Intake   2265 ml  Output   1225 ml  Net   1040 ml   Filed Weights   03/20/13 1001 03/21/13 0513  Weight: 172 lb (78.019 kg) 167 lb 9.6 oz (76.023 kg)   Exam:   General:  Awake, alert, mild distress, cooperative, bilateral scleral icterus seen   Cardiovascular: normal s1, s2 sounds   Respiratory: BBS clear to auscultation   Abdomen: soft, nondistended, no masses   Musculoskeletal: no cyanosis   Data Reviewed: Basic Metabolic Panel:  Recent Labs Lab 03/20/13 1029 03/22/13 0615  NA 139 136*  K 3.4* 3.6*  CL 104 101  CO2 22 21  GLUCOSE 110* 130*  BUN 5* 8  CREATININE 0.54 0.65  CALCIUM 9.2 8.8   Liver Function Tests:  Recent Labs Lab 03/20/13 1029 03/22/13 0615  AST 35 31  ALT 24 20  ALKPHOS 98 90  BILITOT 6.2* 8.1*  PROT 8.0  7.8  ALBUMIN 4.3 4.3   No results found for this basename: LIPASE, AMYLASE,  in the last 168 hours No results found for this basename: AMMONIA,  in the last 168 hours CBC:  Recent Labs Lab 03/20/13 1029 03/22/13 0615  WBC 19.9* 27.2*  NEUTROABS 15.1* 20.1*  HGB 8.7* 7.8*  HCT 24.9* 21.8*  MCV 92.6 90.5  PLT 485* 461*   Cardiac Enzymes: No results found for this basename: CKTOTAL, CKMB, CKMBINDEX, TROPONINI,  in the last 168 hours BNP (last 3 results)  Recent Labs  11/13/12 1826  PROBNP 106.3   CBG: No results found for this basename: GLUCAP,  in the last 168 hours  No results found for this or any previous visit (from the past 240 hour(s)).   Studies: No results found.  Scheduled Meds: . folic acid  1 mg Oral q morning - 10a  . HYDROmorphone PCA 0.3 mg/mL   Intravenous 6 times per day  . hydroxyurea  500 mg Oral BID WC  . ketorolac  30 mg Intravenous 4 times per day  . morphine  15 mg Oral BID  . Rivaroxaban  20 mg Oral Q breakfast  . senna  1 tablet Oral Daily   Continuous Infusions: . dextrose 5 % and 0.9 % NaCl with KCl 40 mEq/L  Active Problems:   Acute sickle cell crisis   Sickle cell pain crisis  Sudais Banghart [employer] Pager [phone]. If 7PM-7AM, please contact night-coverage at [email or web address], password [facility] 03/22/2013, 10:56 AM  LOS: 2 days

## 2013-03-22 NOTE — Progress Notes (Signed)
Patient unhappy with pain management.  Patient stated he would leave AMA at 1500 if no changes were made to his pain medication.  I notified Dr. Wynetta Emery and he further adjusted patient's custom dose Dilaudid PCA.  I made the changes to the PCA settings per the order and explained this to the patient.  He is still very unhappy but made no further comments about leaving AMA.  Will monitor.  Zandra Abts [facility] 03/22/2013

## 2013-03-22 NOTE — H&P (Signed)
Pt seen and examined and discussed with NP Cammie Sickle. Agree with assessment an plan.

## 2013-03-22 NOTE — Progress Notes (Signed)
Patient received an additional 2mg  IV dilaudid per order at 1548 and states he finally had some relief from his pain.  Zandra Abts [facility]  03/22/2013

## 2013-03-22 NOTE — Progress Notes (Addendum)
Received call from nurse that patient is threatening to leave AMA because he is not happy with the PCA and settings and wanting IV push dilaudid.  We changed settings this morning to include a continuous basal rate of 0.5 mg/hr plus increased bolus doses and decreased bolus interval from 10 mins to 8 mins.  Pt is not using PCA maximally.  It was explained to patient multiple times that he could receive more medication and that he is not using the PCA to the maximal settings.  Will make further adjustments to PCA now to get better pain control and reassess.  It was explained to patient that for safety reasons that making rapid large changes and dose adjustments to PCA settings would not be appropriate.  Nurse reporting that patient likely will leave AMA.   I also spoke with his PCP Dr. Zigmund Daniel and will be adding clinician assisted dilaudid dosing to his pain management regimen.  Murvin Natal, MD

## 2013-03-23 DIAGNOSIS — D5700 Hb-SS disease with crisis, unspecified: Secondary | ICD-10-CM | POA: Diagnosis not present

## 2013-03-23 DIAGNOSIS — I2782 Chronic pulmonary embolism: Secondary | ICD-10-CM | POA: Diagnosis not present

## 2013-03-23 DIAGNOSIS — D5701 Hb-SS disease with acute chest syndrome: Secondary | ICD-10-CM | POA: Diagnosis not present

## 2013-03-23 DIAGNOSIS — E860 Dehydration: Secondary | ICD-10-CM | POA: Diagnosis not present

## 2013-03-23 DIAGNOSIS — D72829 Elevated white blood cell count, unspecified: Secondary | ICD-10-CM | POA: Diagnosis not present

## 2013-03-23 DIAGNOSIS — R0902 Hypoxemia: Secondary | ICD-10-CM | POA: Diagnosis not present

## 2013-03-23 DIAGNOSIS — J189 Pneumonia, unspecified organism: Secondary | ICD-10-CM | POA: Diagnosis not present

## 2013-03-23 DIAGNOSIS — E876 Hypokalemia: Secondary | ICD-10-CM | POA: Diagnosis not present

## 2013-03-23 DIAGNOSIS — Z7901 Long term (current) use of anticoagulants: Secondary | ICD-10-CM | POA: Diagnosis not present

## 2013-03-23 LAB — CBC WITH DIFFERENTIAL/PLATELET
Basophils Absolute: 0.3 10*3/uL — ABNORMAL HIGH (ref 0.0–0.1)
Basophils Relative: 1 % (ref 0–1)
EOS PCT: 1 % (ref 0–5)
Eosinophils Absolute: 0.3 10*3/uL (ref 0.0–0.7)
HCT: 18.7 % — ABNORMAL LOW (ref 39.0–52.0)
HEMOGLOBIN: 6.6 g/dL — AB (ref 13.0–17.0)
Lymphocytes Relative: 14 % (ref 12–46)
Lymphs Abs: 3.7 10*3/uL (ref 0.7–4.0)
MCH: 31.7 pg (ref 26.0–34.0)
MCHC: 35.3 g/dL (ref 30.0–36.0)
MCV: 89.9 fL (ref 78.0–100.0)
MONO ABS: 3.2 10*3/uL — AB (ref 0.1–1.0)
Monocytes Relative: 12 % (ref 3–12)
NEUTROS PCT: 72 % (ref 43–77)
Neutro Abs: 18.8 10*3/uL — ABNORMAL HIGH (ref 1.7–7.7)
Platelets: 407 10*3/uL — ABNORMAL HIGH (ref 150–400)
RBC: 2.08 MIL/uL — AB (ref 4.22–5.81)
RDW: 19.9 % — ABNORMAL HIGH (ref 11.5–15.5)
WBC: 26.3 10*3/uL — ABNORMAL HIGH (ref 4.0–10.5)

## 2013-03-23 LAB — COMPREHENSIVE METABOLIC PANEL
ALT: 19 U/L (ref 0–53)
AST: 34 U/L (ref 0–37)
Albumin: 3.8 g/dL (ref 3.5–5.2)
Alkaline Phosphatase: 81 U/L (ref 39–117)
BILIRUBIN TOTAL: 6.2 mg/dL — AB (ref 0.3–1.2)
BUN: 8 mg/dL (ref 6–23)
CO2: 20 mEq/L (ref 19–32)
Calcium: 8.8 mg/dL (ref 8.4–10.5)
Chloride: 110 mEq/L (ref 96–112)
Creatinine, Ser: 0.58 mg/dL (ref 0.50–1.35)
GFR calc Af Amer: >90 mL/min (ref >90)
GFR calc non Af Amer: >90 mL/min (ref >90)
Glucose, Bld: 126 mg/dL — ABNORMAL HIGH (ref 70–99)
POTASSIUM: 4.4 meq/L (ref 3.7–5.3)
Sodium: 142 mEq/L (ref 137–147)
TOTAL PROTEIN: 7.1 g/dL (ref 6.0–8.3)

## 2013-03-23 LAB — PREPARE RBC (CROSSMATCH)

## 2013-03-23 MED ORDER — HYDROMORPHONE 0.3 MG/ML IV SOLN: INTRAVENOUS | Status: DC

## 2013-03-23 MED ORDER — LORAZEPAM 2 MG/ML IJ SOLN
0.5000 mg | Freq: Once | INTRAMUSCULAR | Status: AC
  Administered 2013-03-23: 0.5 mg via INTRAVENOUS
  Filled 2013-03-23: qty 1

## 2013-03-23 MED ORDER — HALOPERIDOL LACTATE 5 MG/ML IJ SOLN
INTRAMUSCULAR | Status: AC
  Filled 2013-03-23: qty 1

## 2013-03-23 MED ORDER — HALOPERIDOL LACTATE 5 MG/ML IJ SOLN
2.0000 mg | Freq: Once | INTRAMUSCULAR | Status: AC
  Administered 2013-03-23: 2 mg via INTRAVENOUS

## 2013-03-23 MED ORDER — HYDROMORPHONE 0.3 MG/ML IV SOLN
INTRAVENOUS | Status: DC
  Administered 2013-03-23: 9.92 mg via INTRAVENOUS
  Administered 2013-03-23: 5.86 mg via INTRAVENOUS
  Administered 2013-03-23: 25 mL via INTRAVENOUS
  Administered 2013-03-23 (×2): via INTRAVENOUS
  Administered 2013-03-23 (×2): 25 mL via INTRAVENOUS
  Administered 2013-03-24: 6.6 mg via INTRAVENOUS
  Administered 2013-03-24 (×2): via INTRAVENOUS
  Administered 2013-03-24: 10.4 mg via INTRAVENOUS
  Administered 2013-03-24: 16:00:00 via INTRAVENOUS
  Administered 2013-03-24: 12.54 mg via INTRAVENOUS
  Administered 2013-03-24: via INTRAVENOUS
  Administered 2013-03-24: 21.29 mg via INTRAVENOUS
  Administered 2013-03-24: 03:00:00 via INTRAVENOUS
  Administered 2013-03-25: 7.62 mg via INTRAVENOUS
  Administered 2013-03-25: 7.64 mg via INTRAVENOUS
  Administered 2013-03-25: 15:00:00 via INTRAVENOUS
  Administered 2013-03-25: 11.4 mg via INTRAVENOUS
  Administered 2013-03-25 (×2): via INTRAVENOUS
  Administered 2013-03-25: 11.4 mg via INTRAVENOUS
  Administered 2013-03-25: 11.31 mg via INTRAVENOUS
  Administered 2013-03-25: 10:00:00 via INTRAVENOUS
  Administered 2013-03-26: 18.84 mg via INTRAVENOUS
  Administered 2013-03-26: 03:00:00 via INTRAVENOUS
  Administered 2013-03-26: 11.51 mg via INTRAVENOUS
  Administered 2013-03-26 (×3): via INTRAVENOUS
  Administered 2013-03-26: 13.07 mg via INTRAVENOUS
  Administered 2013-03-26: 7.65 mg via INTRAVENOUS
  Filled 2013-03-23 (×28): qty 25

## 2013-03-23 NOTE — Progress Notes (Addendum)
PHYSICIAN PROGRESS NOTE  [name] SWN:[id number] DOB: 06-09-1980 DOA: 03/20/2013  03/23/2013   PCP: [name],[name], MD  Assessment/Plan: 1. Acute SS Crisis - Pt reports that pain is better controlled this morning. He did not tolerate clinician assisted dosing as he became delerious overnight (suspect this was narcotic induced) and required a dose of haldol. Will discontinue clinician assisted dosing, continue dilaudid PCA, lower basal rate, continue bolus doses and monitor. Pt had received 8.79 mg, 18 demands, 6 deliveries.   Continue toradol for treating inflammation, continue IVFs, add incentive spirometry. Encourage ambulation.  Continue hydrea. 2. Anemia - Hg is 6.6.  Pt not symptomatic. Transfusion not indicated. Will DC transfusion that was ordered.  3. Leukocytosis -  I suspect this is related to acute crisis inflammation, 2 view CXR negative for infection, sputum culture pending, follow, no clinical s/s infection  4. Hypokalemia - repleted, will follow  5. History of PE - continue Xarelto daily.   Code Status: FULL  Family Communication: no family at bedside  Disposition Plan: Not ready for discharge  HPI/Subjective: Pt reporting that pain is better controlled this morning. He appears comfortable.   Objective: Filed Vitals:   03/23/13 0700  BP: 136/96  Pulse: 90  Temp: 98.5 F (36.9 C)  Resp: 20    Intake/Output Summary (Last 24 hours) at 03/23/13 0913 Last data filed at 03/23/13 0603  Gross per 24 hour  Intake 2989.67 ml  Output    400 ml  Net 2589.67 ml   Filed Weights   03/20/13 1001 03/21/13 0513 03/23/13 0700  Weight: 172 lb (78.019 kg) 167 lb 9.6 oz (76.023 kg) 172 lb 2.9 oz (78.1 kg)    Exam:  General: Awake, alert, no apparent distress, oriented this morning, cooperative, bilateral scleral icterus unchanged  Cardiovascular: normal s1, s2 sounds  Respiratory: BBS clear to auscultation  Abdomen: soft, nondistended, no masses  Musculoskeletal:  no cyanosis or edema   Data Reviewed: Basic Metabolic Panel:  Recent Labs Lab 03/20/13 1029 03/22/13 0615 03/23/13 0610  NA 139 136* 142  K 3.4* 3.6* 4.4  CL 104 101 110  CO2 22 21 20   GLUCOSE 110* 130* 126*  BUN 5* 8 8  CREATININE 0.54 0.65 0.58  CALCIUM 9.2 8.8 8.8  MG  --  1.9  --    Liver Function Tests:  Recent Labs Lab 03/20/13 1029 03/22/13 0615 03/23/13 0610  AST 35 31 34  ALT 24 20 19   ALKPHOS 98 90 81  BILITOT 6.2* 8.1* 6.2*  PROT 8.0 7.8 7.1  ALBUMIN 4.3 4.3 3.8   No results found for this basename: LIPASE, AMYLASE,  in the last 168 hours No results found for this basename: AMMONIA,  in the last 168 hours CBC:  Recent Labs Lab 03/20/13 1029 03/22/13 0615 03/23/13 0610  WBC 19.9* 27.2* 26.3*  NEUTROABS 15.1* 20.1* 18.8*  HGB 8.7* 7.8* 6.6*  HCT 24.9* 21.8* 18.7*  MCV 92.6 90.5 89.9  PLT 485* 461* 407*   Cardiac Enzymes: No results found for this basename: CKTOTAL, CKMB, CKMBINDEX, TROPONINI,  in the last 168 hours BNP (last 3 results)  Recent Labs  11/13/12 1826  PROBNP 106.3   CBG: No results found for this basename: GLUCAP,  in the last 168 hours  Recent Results (from the past 240 hour(s))  CULTURE, EXPECTORATED SPUTUM-ASSESSMENT     Status: None   Collection Time    03/22/13  2:42 PM      Result Value Ref Range  Status   Specimen Description SPUTUM   Final   Special Requests Normal   Final   Sputum evaluation     Final   Value: THIS SPECIMEN IS ACCEPTABLE. RESPIRATORY CULTURE REPORT TO FOLLOW.   Report Status 03/22/2013 FINAL   Final    Studies: Dg Chest 2 View  03/22/2013   CLINICAL DATA:  Former smoking history, leukocytosis, history of sickle cell disease  EXAM: CHEST  2 VIEW  COMPARISON:  Portable chest x-ray of 02/14/2012  FINDINGS: No active infiltrate or effusion is seen. Indistinct perihilar vasculature may indicate very minimal pulmonary vascular congestion. The heart is mildly enlarged and stable. A left-sided  Port-A-Cath tip remains at the expected SVC-RA junction.  IMPRESSION: Stable mild cardiomegaly with minimal pulmonary vascular congestion.   Electronically Signed   By: Ivar Drape M.D.   On: 03/22/2013 11:20    Scheduled Meds: . folic acid  1 mg Oral q morning - 10a  . HYDROmorphone PCA 0.3 mg/mL   Intravenous 6 times per day  . hydroxyurea  500 mg Oral BID WC  . ketorolac  30 mg Intravenous 4 times per day  . morphine  15 mg Oral BID  . Rivaroxaban  20 mg Oral Q breakfast  . senna  1 tablet Oral Daily   Continuous Infusions: . dextrose 5 % and 0.9 % NaCl with KCl 40 mEq/L 125 mL/hr at 03/23/13 2637   Active Problems:   Acute sickle cell crisis   Sickle cell pain crisis  [place] [facility] Pager [phone]. If 7PM-7AM, please contact night-coverage at [email or web address], password [name] 03/23/2013, 9:13 AM  LOS: 3 days

## 2013-03-23 NOTE — Progress Notes (Signed)
CRITICAL VALUE ALERT  Critical value received: HGB 6.6  Date of notification: 03/23/13  Time of notification:  0638  Critical value read back:Yes  Nurse who received alert: Drema Dallas., RN  MD notified (1st page): Fredirick Maudlin.  Time of first page: 0645  MD notified (2nd page): n/a  Time of second page: n/a  Responding MD: Rogue Bussing  Time MD responded:  Around [id number]

## 2013-03-23 NOTE — Progress Notes (Signed)
Patient had an extremely restless night last night.  He reported pain being in control but became extremely confused, agitated, and developed hallucinations/Delusions (ex. EKG machine was a bird).  PCA dilaudid settings per MD order.  Patient unhooked his Port tubing several times despite education regarding infection risks.  Once patient reported he took off the cap and blood was pouring from the site. PCA tubing was then changed after it was found lying on the floor. Patient also continued to take his oxygen off, desaturating in the mid 80's. [facility] were notified several times throughout the night regarding patient's behavior.  Patient was given oral ativan once 0.5 mg and IV 0.5mg  x1.  Both times the ativan was ineffective.  Patient also continued to fight sleep and rock back and forth during the night.  Haldol was finally attempted at 0415 and patient was able to settle down and go to sleep.  He reported that he was not taking drugs prior to admission and also has been unable to get a good night sleep since Monday night.  Patient's HR stayed slightly tachy overnight and 02 stayed low 90's with oxygen.  Besides this patient's vitals WNL.  Day shift RN notified about patient activity overnight.  Will monitor. Azzie Glatter [place]

## 2013-03-24 DIAGNOSIS — Z7901 Long term (current) use of anticoagulants: Secondary | ICD-10-CM | POA: Diagnosis not present

## 2013-03-24 DIAGNOSIS — D5701 Hb-SS disease with acute chest syndrome: Secondary | ICD-10-CM | POA: Diagnosis not present

## 2013-03-24 DIAGNOSIS — I2782 Chronic pulmonary embolism: Secondary | ICD-10-CM | POA: Diagnosis not present

## 2013-03-24 DIAGNOSIS — E876 Hypokalemia: Secondary | ICD-10-CM | POA: Diagnosis not present

## 2013-03-24 DIAGNOSIS — D5700 Hb-SS disease with crisis, unspecified: Secondary | ICD-10-CM | POA: Diagnosis not present

## 2013-03-24 DIAGNOSIS — R0789 Other chest pain: Secondary | ICD-10-CM | POA: Diagnosis not present

## 2013-03-24 DIAGNOSIS — J189 Pneumonia, unspecified organism: Secondary | ICD-10-CM | POA: Diagnosis not present

## 2013-03-24 DIAGNOSIS — E860 Dehydration: Secondary | ICD-10-CM | POA: Diagnosis not present

## 2013-03-24 DIAGNOSIS — R0902 Hypoxemia: Secondary | ICD-10-CM | POA: Diagnosis not present

## 2013-03-24 LAB — COMPREHENSIVE METABOLIC PANEL
ALT: 21 U/L (ref 0–53)
AST: 41 U/L — ABNORMAL HIGH (ref 0–37)
Albumin: 4 g/dL (ref 3.5–5.2)
Alkaline Phosphatase: 86 U/L (ref 39–117)
BUN: 5 mg/dL — AB (ref 6–23)
CALCIUM: 8.9 mg/dL (ref 8.4–10.5)
CO2: 21 meq/L (ref 19–32)
CREATININE: 0.54 mg/dL (ref 0.50–1.35)
Chloride: 111 mEq/L (ref 96–112)
GFR calc Af Amer: >90 mL/min (ref >90)
Glucose, Bld: 118 mg/dL — ABNORMAL HIGH (ref 70–99)
Potassium: 4.6 mEq/L (ref 3.7–5.3)
Sodium: 143 mEq/L (ref 137–147)
Total Bilirubin: 6.1 mg/dL — ABNORMAL HIGH (ref 0.3–1.2)
Total Protein: 7.5 g/dL (ref 6.0–8.3)

## 2013-03-24 LAB — CBC
HEMATOCRIT: 19.6 % — AB (ref 39.0–52.0)
Hemoglobin: 7 g/dL — ABNORMAL LOW (ref 13.0–17.0)
MCH: 31.8 pg (ref 26.0–34.0)
MCHC: 35.7 g/dL (ref 30.0–36.0)
MCV: 89.1 fL (ref 78.0–100.0)
Platelets: 459 10*3/uL — ABNORMAL HIGH (ref 150–400)
RBC: 2.2 MIL/uL — ABNORMAL LOW (ref 4.22–5.81)
RDW: 19.5 % — AB (ref 11.5–15.5)
WBC: 20.9 10*3/uL — AB (ref 4.0–10.5)

## 2013-03-24 MED ORDER — HYDROMORPHONE HCL PF 1 MG/ML IJ SOLN
1.0000 mg | INTRAMUSCULAR | Status: DC | PRN
  Administered 2013-03-24 – 2013-03-27 (×17): 2 mg via INTRAVENOUS
  Administered 2013-03-27: 1 mg via INTRAVENOUS
  Administered 2013-03-27 (×2): 2 mg via INTRAVENOUS
  Administered 2013-03-27: 1 mg via INTRAVENOUS
  Administered 2013-03-27 – 2013-03-28 (×4): 2 mg via INTRAVENOUS
  Administered 2013-03-28 (×2): 1 mg via INTRAVENOUS
  Administered 2013-03-28: 2 mg via INTRAVENOUS
  Administered 2013-03-28: 1 mg via INTRAVENOUS
  Administered 2013-03-29 – 2013-03-30 (×10): 2 mg via INTRAVENOUS
  Filled 2013-03-24 (×30): qty 2
  Filled 2013-03-24: qty 1
  Filled 2013-03-24 (×4): qty 2
  Filled 2013-03-24: qty 1
  Filled 2013-03-24: qty 2

## 2013-03-24 NOTE — Progress Notes (Signed)
PHYSICIAN PROGRESS NOTE  [name] [name] DOB: Oct 10, 1980 DOA: 03/20/2013  03/24/2013   PCP: [name],[name], MD  Assessment/Plan: 1. Acute SS Crisis - Pt having an exacerbation of pain in rib cage and lower back.  This has been a significant painful crisis for patient.  He is reporting that he has not had one this bad in several years.  He would like to try  clinician assisted dosing again as he had his best pain relief with this but developed some delerium  (suspect this was narcotic induced) and required a dose of haldol. Will attempt clinician assisted dosing at lower doses, continue dilaudid PCA, lower basal rate, continue bolus doses and monitor closely. Pt had received 16.77 mg, 24 demands, 19 deliveries in last 24 hours. Continue toradol for treating inflammation, continue IVFs, add incentive spirometry. Encourage ambulation. Continue hydrea. 2. Anemia - Hg is 7.0. Pt not symptomatic. Transfusion not indicated. 3. Leukocytosis - I suspect this is related to acute crisis inflammation, 2 view CXR negative for infection, sputum culture pending, follow, no clinical s/s infection  4. Hypokalemia - repleted, will follow.   5. History of PE - continue Xarelto daily.   Code Status: FULL  Family Communication: no family at bedside  Disposition Plan: Not ready for discharge  HPI/Subjective: Pt says that his pain is 7/10 this morning and he is not sleeping well.  He has had expansion of pain in rib cage and back today.  He wants to try clinician assisted dosing again.   Objective: Filed Vitals:   03/24/13 0733  BP:   Pulse:   Temp:   Resp: 16    Intake/Output Summary (Last 24 hours) at 03/24/13 0957 Last data filed at 03/24/13 0500  Gross per 24 hour  Intake 3316.25 ml  Output   2320 ml  Net 996.25 ml   Filed Weights   03/20/13 1001 03/21/13 0513 03/23/13 0700  Weight: 172 lb (78.019 kg) 167 lb 9.6 oz (76.023 kg) 172 lb 2.9 oz (78.1 kg)   Exam:  General:  Awake, alert, no apparent distress, oriented this morning, cooperative, bilateral scleral icterus unchanged  Cardiovascular: normal s1, s2 sounds  Respiratory: BBS clear to auscultation  Abdomen: soft, nondistended, no masses  Musculoskeletal: rash noted on both calves. no cyanosis or edema   Data Reviewed: Basic Metabolic Panel:  Recent Labs Lab 03/20/13 1029 03/22/13 0615 03/23/13 0610 03/24/13 0615  NA 139 136* 142 143  K 3.4* 3.6* 4.4 4.6  CL 104 101 110 111  CO2 22 21 20 21   GLUCOSE 110* 130* 126* 118*  BUN 5* 8 8 5*  CREATININE 0.54 0.65 0.58 0.54  CALCIUM 9.2 8.8 8.8 8.9  MG  --  1.9  --   --    Liver Function Tests:  Recent Labs Lab 03/20/13 1029 03/22/13 0615 03/23/13 0610 03/24/13 0615  AST 35 31 34 41*  ALT 24 20 19 21   ALKPHOS 98 90 81 86  BILITOT 6.2* 8.1* 6.2* 6.1*  PROT 8.0 7.8 7.1 7.5  ALBUMIN 4.3 4.3 3.8 4.0   No results found for this basename: LIPASE, AMYLASE,  in the last 168 hours No results found for this basename: AMMONIA,  in the last 168 hours CBC:  Recent Labs Lab 03/20/13 1029 03/22/13 0615 03/23/13 0610 03/24/13 0615  WBC 19.9* 27.2* 26.3* 20.9*  NEUTROABS 15.1* 20.1* 18.8*  --   HGB 8.7* 7.8* 6.6* 7.0*  HCT 24.9* 21.8* 18.7* 19.6*  MCV 92.6 90.5 89.9  89.1  PLT 485* 461* 407* 459*   Cardiac Enzymes: No results found for this basename: CKTOTAL, CKMB, CKMBINDEX, TROPONINI,  in the last 168 hours BNP (last 3 results)  Recent Labs  11/13/12 1826  PROBNP 106.3   CBG: No results found for this basename: GLUCAP,  in the last 168 hours  Recent Results (from the past 240 hour(s))  CULTURE, EXPECTORATED SPUTUM-ASSESSMENT     Status: None   Collection Time    03/22/13  2:42 PM      Result Value Ref Range Status   Specimen Description SPUTUM   Final   Special Requests Normal   Final   Sputum evaluation     Final   Value: THIS SPECIMEN IS ACCEPTABLE. RESPIRATORY CULTURE REPORT TO FOLLOW.   Report Status 03/22/2013 FINAL    Final  CULTURE, RESPIRATORY (NON-EXPECTORATED)     Status: None   Collection Time    03/22/13  2:42 PM      Result Value Ref Range Status   Specimen Description SPUTUM   Final   Special Requests NONE   Final   Gram Stain     Final   Value: MODERATE WBC PRESENT,BOTH PMN AND MONONUCLEAR     MODERATE SQUAMOUS EPITHELIAL CELLS PRESENT     MODERATE GRAM POSITIVE COCCI IN PAIRS     FEW GRAM NEGATIVE RODS     MODERATE YEAST   Culture     Final   Value: Culture reincubated for better growth     Performed at [facility]   Report Status PENDING   Incomplete    Studies: Dg Chest 2 View  03/22/2013   CLINICAL DATA:  Former smoking history, leukocytosis, history of sickle cell disease  EXAM: CHEST  2 VIEW  COMPARISON:  Portable chest x-ray of 02/14/2012  FINDINGS: No active infiltrate or effusion is seen. Indistinct perihilar vasculature may indicate very minimal pulmonary vascular congestion. The heart is mildly enlarged and stable. A left-sided Port-A-Cath tip remains at the expected SVC-RA junction.  IMPRESSION: Stable mild cardiomegaly with minimal pulmonary vascular congestion.   Electronically Signed   By: Ivar Drape M.D.   On: 03/22/2013 11:20   Scheduled Meds: . folic acid  1 mg Oral q morning - 10a  . HYDROmorphone PCA 0.3 mg/mL   Intravenous 6 times per day  . hydroxyurea  500 mg Oral BID WC  . ketorolac  30 mg Intravenous 4 times per day  . morphine  15 mg Oral BID  . Rivaroxaban  20 mg Oral Q breakfast  . senna  1 tablet Oral Daily   Continuous Infusions: . dextrose 5 % and 0.9 % NaCl with KCl 40 mEq/L 100 mL/hr at 03/23/13 1418   Active Problems:   Acute sickle cell crisis   Sickle cell pain crisis  [facility] Pager [phone]. If 7PM-7AM, please contact night-coverage at [email or web address], password [facility] 03/24/2013, 9:57 AM  LOS: 4 days

## 2013-03-25 DIAGNOSIS — Z86711 Personal history of pulmonary embolism: Secondary | ICD-10-CM | POA: Diagnosis not present

## 2013-03-25 DIAGNOSIS — I2782 Chronic pulmonary embolism: Secondary | ICD-10-CM | POA: Diagnosis not present

## 2013-03-25 DIAGNOSIS — D5701 Hb-SS disease with acute chest syndrome: Secondary | ICD-10-CM | POA: Diagnosis not present

## 2013-03-25 DIAGNOSIS — D5700 Hb-SS disease with crisis, unspecified: Secondary | ICD-10-CM | POA: Diagnosis not present

## 2013-03-25 DIAGNOSIS — J189 Pneumonia, unspecified organism: Secondary | ICD-10-CM | POA: Diagnosis not present

## 2013-03-25 DIAGNOSIS — R0902 Hypoxemia: Secondary | ICD-10-CM | POA: Diagnosis not present

## 2013-03-25 DIAGNOSIS — E876 Hypokalemia: Secondary | ICD-10-CM | POA: Diagnosis not present

## 2013-03-25 DIAGNOSIS — Z7901 Long term (current) use of anticoagulants: Secondary | ICD-10-CM | POA: Diagnosis not present

## 2013-03-25 DIAGNOSIS — E860 Dehydration: Secondary | ICD-10-CM | POA: Diagnosis not present

## 2013-03-25 LAB — COMPREHENSIVE METABOLIC PANEL
ALBUMIN: 3.8 g/dL (ref 3.5–5.2)
ALK PHOS: 94 U/L (ref 39–117)
ALT: 21 U/L (ref 0–53)
AST: 40 U/L — AB (ref 0–37)
BUN: 7 mg/dL (ref 6–23)
CO2: 22 mEq/L (ref 19–32)
Calcium: 9 mg/dL (ref 8.4–10.5)
Chloride: 108 mEq/L (ref 96–112)
Creatinine, Ser: 0.53 mg/dL (ref 0.50–1.35)
GFR calc non Af Amer: >90 mL/min (ref >90)
GLUCOSE: 110 mg/dL — AB (ref 70–99)
POTASSIUM: 4.2 meq/L (ref 3.7–5.3)
Sodium: 140 mEq/L (ref 137–147)
Total Bilirubin: 5.2 mg/dL — ABNORMAL HIGH (ref 0.3–1.2)
Total Protein: 7.4 g/dL (ref 6.0–8.3)

## 2013-03-25 LAB — CBC
HCT: 19.4 % — ABNORMAL LOW (ref 39.0–52.0)
HEMOGLOBIN: 6.7 g/dL — AB (ref 13.0–17.0)
MCH: 31.2 pg (ref 26.0–34.0)
MCHC: 34.5 g/dL (ref 30.0–36.0)
MCV: 90.2 fL (ref 78.0–100.0)
PLATELETS: 463 10*3/uL — AB (ref 150–400)
RBC: 2.15 MIL/uL — ABNORMAL LOW (ref 4.22–5.81)
RDW: 20.3 % — AB (ref 11.5–15.5)
WBC: 20.4 10*3/uL — ABNORMAL HIGH (ref 4.0–10.5)

## 2013-03-25 LAB — CULTURE, RESPIRATORY W GRAM STAIN

## 2013-03-25 LAB — CULTURE, RESPIRATORY

## 2013-03-25 MED ORDER — KETOROLAC TROMETHAMINE 15 MG/ML IJ SOLN: 30.0000 mg | Freq: Four times a day (QID) | INTRAMUSCULAR | Status: DC

## 2013-03-25 MED ORDER — KETOROLAC TROMETHAMINE 15 MG/ML IJ SOLN
30.0000 mg | Freq: Four times a day (QID) | INTRAMUSCULAR | Status: DC
  Administered 2013-03-25 – 2013-03-26 (×7): 30 mg via INTRAVENOUS
  Filled 2013-03-25 (×5): qty 2

## 2013-03-25 NOTE — Progress Notes (Signed)
I received report regarding sputum culture positive for MRSA.  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  He has no fever, chest xray does not show pneumonia.  I called and spoke with ID on call Dr. Baxter Flattery and will monitor him clinically but no indication to start any antibiotics at this time.  Murvin Natal, MD

## 2013-03-25 NOTE — Progress Notes (Signed)
PHYSICIAN PROGRESS NOTE  BOW BUNTYN DDU:[id number] DOB: 1980-08-05 DOA: 03/20/2013  03/25/2013   PCP: [name],[name], MD  Assessment/Plan: 1. Acute SS Crisis - Pt. has better pain control with with the addition of clinician assisted dosing. He reports that he has improvement from a pain score of 8 to a pain score of 5 in general after pain meds given. This has been the worse painful crisis that patient has had in years. Will continue current management at this time.  Pt had received 60 mg, 79 demands, 70 deliveries in last 24 hours via PCA. Continue toradol for treating inflammation, continue IVFs, add incentive spirometry. Encourage ambulation. Continue hydrea. 2. Anemia - following closely, transfusion not indicated at this time.  3. Leukocytosis - I suspect this is related to acute crisis inflammation, 2 view CXR negative for infection, following clinically. 4. Hypokalemia - repleted, will follow.  5. History of PE - continue Xarelto daily.   Code Status: FULL  Family Communication: no family at bedside  Disposition Plan: Not ready for discharge  HPI/Subjective: Pt reports that he is getting better pain control at this time but still requiring clinician assisted dosing.  Overall reports improvement.     Objective: Filed Vitals:   03/25/13 0500  BP: 136/86  Pulse: 77  Temp: 98.4 F (36.9 C)  Resp: 20    Intake/Output Summary (Last 24 hours) at 03/25/13 1002 Last data filed at 03/25/13 0608  Gross per 24 hour  Intake   2250 ml  Output   2375 ml  Net   -125 ml   Filed Weights   03/21/13 0513 03/23/13 0700 03/25/13 0200  Weight: 167 lb 9.6 oz (76.023 kg) 172 lb 2.9 oz (78.1 kg) 171 lb 1.6 oz (77.61 kg)   Exam: General: Awake, alert, no apparent distress, oriented this morning, cooperative, bilateral scleral icterus unchanged  Cardiovascular: normal s1, s2 sounds  Respiratory: BBS clear to auscultation  Abdomen: soft, nondistended, no masses  Musculoskeletal:  rash noted on both calves. no cyanosis or edema   Data Reviewed: Basic Metabolic Panel:  Recent Labs Lab 03/20/13 1029 03/22/13 0615 03/23/13 0610 03/24/13 0615 03/25/13 0530  NA 139 136* 142 143 140  K 3.4* 3.6* 4.4 4.6 4.2  CL 104 101 110 111 108  CO2 22 21 20 21 22   GLUCOSE 110* 130* 126* 118* 110*  BUN 5* 8 8 5* 7  CREATININE 0.54 0.65 0.58 0.54 0.53  CALCIUM 9.2 8.8 8.8 8.9 9.0  MG  --  1.9  --   --   --    Liver Function Tests:  Recent Labs Lab 03/20/13 1029 03/22/13 0615 03/23/13 0610 03/24/13 0615 03/25/13 0530  AST 35 31 34 41* 40*  ALT 24 20 19 21 21   ALKPHOS 98 90 81 86 94  BILITOT 6.2* 8.1* 6.2* 6.1* 5.2*  PROT 8.0 7.8 7.1 7.5 7.4  ALBUMIN 4.3 4.3 3.8 4.0 3.8   No results found for this basename: LIPASE, AMYLASE,  in the last 168 hours No results found for this basename: AMMONIA,  in the last 168 hours CBC:  Recent Labs Lab 03/20/13 1029 03/22/13 0615 03/23/13 0610 03/24/13 0615 03/25/13 0530  WBC 19.9* 27.2* 26.3* 20.9* 20.4*  NEUTROABS 15.1* 20.1* 18.8*  --   --   HGB 8.7* 7.8* 6.6* 7.0* 6.7*  HCT 24.9* 21.8* 18.7* 19.6* 19.4*  MCV 92.6 90.5 89.9 89.1 90.2  PLT 485* 461* 407* 459* 463*   Cardiac Enzymes: No results found for  this basename: CKTOTAL, CKMB, CKMBINDEX, TROPONINI,  in the last 168 hours BNP (last 3 results)  Recent Labs  11/13/12 1826  PROBNP 106.3   CBG: No results found for this basename: GLUCAP,  in the last 168 hours  Recent Results (from the past 240 hour(s))  CULTURE, EXPECTORATED SPUTUM-ASSESSMENT     Status: None   Collection Time    03/22/13  2:42 PM      Result Value Ref Range Status   Specimen Description SPUTUM   Final   Special Requests Normal   Final   Sputum evaluation     Final   Value: THIS SPECIMEN IS ACCEPTABLE. RESPIRATORY CULTURE REPORT TO FOLLOW.   Report Status 03/22/2013 FINAL   Final  CULTURE, RESPIRATORY (NON-EXPECTORATED)     Status: None   Collection Time    03/22/13  2:42 PM       Result Value Ref Range Status   Specimen Description SPUTUM   Final   Special Requests NONE   Final   Gram Stain     Final   Value: MODERATE WBC PRESENT,BOTH PMN AND MONONUCLEAR     MODERATE SQUAMOUS EPITHELIAL CELLS PRESENT     MODERATE GRAM POSITIVE COCCI IN PAIRS     FEW GRAM NEGATIVE RODS     MODERATE YEAST   Culture     Final   Value: MODERATE STAPHYLOCOCCUS AUREUS     Note: RIFAMPIN AND GENTAMICIN SHOULD NOT BE USED AS SINGLE DRUGS FOR TREATMENT OF STAPH INFECTIONS.     Performed at [facility]   Report Status PENDING   Incomplete     Studies: No results found.  Scheduled Meds: . folic acid  1 mg Oral q morning - 10a  . HYDROmorphone PCA 0.3 mg/mL   Intravenous 6 times per day  . hydroxyurea  500 mg Oral BID WC  . ketorolac  30 mg Intravenous 4 times per day  . morphine  15 mg Oral BID  . Rivaroxaban  20 mg Oral Q breakfast  . senna  1 tablet Oral Daily   Continuous Infusions: . dextrose 5 % and 0.9 % NaCl with KCl 40 mEq/L 50 mL/hr at 03/25/13 3532   Active Problems:   Acute sickle cell crisis   Sickle cell pain crisis  [facility] Pager [phone]. If 7PM-7AM, please contact night-coverage at [email or web address], password [facility] 03/25/2013, 10:02 AM  LOS: 5 days

## 2013-03-25 NOTE — Progress Notes (Signed)
Received call from [facility] - sputum collected 03/22/13 positive for MRSA.Harlene Ramus

## 2013-03-25 NOTE — Progress Notes (Signed)
CRITICAL VALUE ALERT  Critical value received:  hgb 6.7  Date of notification:  03/25/13  Time of notification:  0600  Critical value read back:yes  Nurse who received alert:  Harlow Asa  MD notified (1st page):  hospitalist on call  Time of first page:  0645  MD notified (2nd page):  Time of second page:  Responding MD:  Fredirick Maudlin  Time MD responded:  [phone]

## 2013-03-26 ENCOUNTER — Encounter (HOSPITAL_COMMUNITY): Payer: Self-pay | Admitting: Radiology

## 2013-03-26 ENCOUNTER — Inpatient Hospital Stay (HOSPITAL_COMMUNITY): Payer: Medicare Other

## 2013-03-26 DIAGNOSIS — J189 Pneumonia, unspecified organism: Secondary | ICD-10-CM | POA: Diagnosis not present

## 2013-03-26 DIAGNOSIS — D5701 Hb-SS disease with acute chest syndrome: Secondary | ICD-10-CM

## 2013-03-26 DIAGNOSIS — D72829 Elevated white blood cell count, unspecified: Secondary | ICD-10-CM | POA: Diagnosis not present

## 2013-03-26 DIAGNOSIS — E876 Hypokalemia: Secondary | ICD-10-CM | POA: Diagnosis not present

## 2013-03-26 DIAGNOSIS — R0902 Hypoxemia: Secondary | ICD-10-CM

## 2013-03-26 DIAGNOSIS — I2782 Chronic pulmonary embolism: Secondary | ICD-10-CM | POA: Diagnosis not present

## 2013-03-26 DIAGNOSIS — D5700 Hb-SS disease with crisis, unspecified: Secondary | ICD-10-CM | POA: Diagnosis not present

## 2013-03-26 LAB — COMPREHENSIVE METABOLIC PANEL
ALT: 21 U/L (ref 0–53)
AST: 38 U/L — ABNORMAL HIGH (ref 0–37)
Albumin: 4.2 g/dL (ref 3.5–5.2)
Alkaline Phosphatase: 95 U/L (ref 39–117)
BILIRUBIN TOTAL: 5.2 mg/dL — AB (ref 0.3–1.2)
BUN: 6 mg/dL (ref 6–23)
CO2: 23 mEq/L (ref 19–32)
CREATININE: 0.57 mg/dL (ref 0.50–1.35)
Calcium: 9 mg/dL (ref 8.4–10.5)
Chloride: 105 mEq/L (ref 96–112)
GFR calc Af Amer: >90 mL/min (ref >90)
GFR calc non Af Amer: >90 mL/min (ref >90)
Glucose, Bld: 127 mg/dL — ABNORMAL HIGH (ref 70–99)
Potassium: 4.3 mEq/L (ref 3.7–5.3)
Sodium: 139 mEq/L (ref 137–147)
TOTAL PROTEIN: 7.6 g/dL (ref 6.0–8.3)

## 2013-03-26 LAB — CBC
HEMATOCRIT: 19.3 % — AB (ref 39.0–52.0)
HEMOGLOBIN: 6.7 g/dL — AB (ref 13.0–17.0)
MCH: 31.3 pg (ref 26.0–34.0)
MCHC: 34.7 g/dL (ref 30.0–36.0)
MCV: 90.2 fL (ref 78.0–100.0)
Platelets: 490 10*3/uL — ABNORMAL HIGH (ref 150–400)
RBC: 2.14 MIL/uL — ABNORMAL LOW (ref 4.22–5.81)
RDW: 20.4 % — AB (ref 11.5–15.5)
WBC: 21.3 10*3/uL — ABNORMAL HIGH (ref 4.0–10.5)

## 2013-03-26 LAB — MRSA PCR SCREENING: MRSA BY PCR: POSITIVE — AB

## 2013-03-26 MED ORDER — DOXYCYCLINE HYCLATE 100 MG IV SOLR
100.0000 mg | Freq: Two times a day (BID) | INTRAVENOUS | Status: DC
  Administered 2013-03-26 – 2013-03-30 (×8): 100 mg via INTRAVENOUS
  Filled 2013-03-26 (×10): qty 100

## 2013-03-26 MED ORDER — KETOROLAC TROMETHAMINE 15 MG/ML IJ SOLN
30.0000 mg | Freq: Four times a day (QID) | INTRAMUSCULAR | Status: AC
  Administered 2013-03-27 (×2): 30 mg via INTRAVENOUS
  Filled 2013-03-26 (×2): qty 2

## 2013-03-26 MED ORDER — IOHEXOL 350 MG/ML SOLN
100.0000 mL | Freq: Once | INTRAVENOUS | Status: AC | PRN
  Administered 2013-03-26: 100 mL via INTRAVENOUS

## 2013-03-26 MED ORDER — HYDROMORPHONE 0.3 MG/ML IV SOLN
INTRAVENOUS | Status: DC
  Administered 2013-03-26 – 2013-03-27 (×4): via INTRAVENOUS
  Administered 2013-03-27: 13 mg via INTRAVENOUS
  Administered 2013-03-27: 8.7 mg via INTRAVENOUS
  Administered 2013-03-27: 08:00:00 via INTRAVENOUS
  Administered 2013-03-27: 13.33 mg via INTRAVENOUS
  Administered 2013-03-27: 13 mg via INTRAVENOUS
  Administered 2013-03-27: 7.59 mg via INTRAVENOUS
  Administered 2013-03-27: 3.19 mg via INTRAVENOUS
  Administered 2013-03-28 (×3): via INTRAVENOUS
  Administered 2013-03-28: 18.21 mg via INTRAVENOUS
  Administered 2013-03-28 (×3): via INTRAVENOUS
  Administered 2013-03-28: 16.3 mg via INTRAVENOUS
  Administered 2013-03-28 – 2013-03-29 (×5): via INTRAVENOUS
  Administered 2013-03-29: 8.3 mg via INTRAVENOUS
  Administered 2013-03-29: 17.71 mg via INTRAVENOUS
  Administered 2013-03-29: 5.6 mg via INTRAVENOUS
  Administered 2013-03-29: 11.75 mg via INTRAVENOUS
  Administered 2013-03-29: 13.62 mg via INTRAVENOUS
  Administered 2013-03-29: 7.47 mg via INTRAVENOUS
  Administered 2013-03-29 – 2013-03-30 (×3): via INTRAVENOUS
  Administered 2013-03-30: 11.85 mg via INTRAVENOUS
  Administered 2013-03-30: 03:00:00 via INTRAVENOUS
  Administered 2013-03-30: 6.82 mg via INTRAVENOUS
  Filled 2013-03-26 (×25): qty 25

## 2013-03-26 MED ORDER — MUPIROCIN 2 % EX OINT
1.0000 | TOPICAL_OINTMENT | Freq: Two times a day (BID) | CUTANEOUS | Status: DC
Start: 2013-03-27 — End: 2013-03-30
  Administered 2013-03-27 – 2013-03-30 (×7): 1 via NASAL
  Filled 2013-03-26: qty 22

## 2013-03-26 MED ORDER — CHLORHEXIDINE GLUCONATE CLOTH 2 % EX PADS
6.0000 | MEDICATED_PAD | Freq: Every day | CUTANEOUS | Status: DC
  Administered 2013-03-28 – 2013-03-30 (×3): 6 via TOPICAL

## 2013-03-26 NOTE — Progress Notes (Signed)
SICKLE CELL SERVICE PROGRESS NOTE  [name] HAL:[id number] DOB: 31-Aug-1980 DOA: 03/20/2013 PCP: Katherinne Mofield A., MD  Assessment/Plan: Active Problems:   Acute sickle cell crisis   Sickle cell pain crisis  1. Acute Chest Syndrome: Pt admitted with HB SS with crisis. He has had an unusual amount of pain and is also relatively hypoxemic. I checked saturations on RA and he was found to be 82-87% on RA which is hypoxemic for this patient. In light of the fact that he has a h/o PE's, I evaluated with Ct angiogram and he has ground glass appearance of lungs. This clinical picture is that of acute chest syndrome. I will transfuse patient 2 units of PRBC's to a goal Hb of 10 and transfer to SDU for closer observation and in the even that he may require Bi-pap. 2. Acute vaso-occlusive crisis: Pt using significant amounts of IV analgesics without significant decrease in pain. Will adjust PCA to eliminate basal rate and continue with clinician assisted doses.Continue Toradol and MS Contin. Transfuse 2 units PRBC's 3. Leukocytosis: Pt has a significant leukocytosis which is likely secondary to Acute Chest Syndrome and inflammatory cascade. Will recheck tomorrow.  Discussed the above with patient and nurse.  Code Status: Full Code Family Communication: N/A Disposition Plan: Transfer to [place].  Pager [phone]. If 7PM-7AM, please contact night-coverage.  03/26/2013, 5:14 PM  LOS: 6 days   Brief narrative:   Consultants:  Pt presented to [facility] with c/o pain consistent with acute crisis. His pain was unresolved wit treatment in the [facility] and he was transferred to the Hospital  For further treatment.   Procedures:  None  Antibiotics:  Doxycycline 2/24 >>  HPI/Subjective: Pt states that pain is 8/10 and now present in both sides of his ribs. He has no pleurisy but he feels that he has gotten no relief whatsoever even with the enormous amounts of  analgesics.  Objective: Filed Vitals:   03/26/13 0947 03/26/13 1103 03/26/13 1333 03/26/13 1704  BP: 129/90  130/84 126/85  Pulse: 78  94 94  Temp: 97.9 F (36.6 C)  98.7 F (37.1 C) 98.7 F (37.1 C)  TempSrc: Oral  Oral Oral  Resp: 16 21 94 16  Height:      Weight:      SpO2: 95% 93% 97% 94%   Weight change: 1 lb 4.8 oz (0.59 kg)  Intake/Output Summary (Last 24 hours) at 03/26/13 1714 Last data filed at 03/26/13 0947  Gross per 24 hour  Intake   1224 ml  Output   1375 ml  Net   -151 ml    General: Alert, awake, oriented x3, in moderately acute distress. Non-toxic appearing HEENT: [place]/AT PEERL, EOMI, mild icterus  Heart: Regular rate and rhythm, without murmurs, rubs, gallops, PMI non-displaced, no heaves or thrills on palpation.  Lungs: Clear to auscultation, no wheezing or rhonchi noted. No increased vocal fremitus resonant to percussion  Abdomen: Soft, nontender, nondistended, positive bowel sounds, no masses no hepatosplenomegaly noted..  Neuro: No focal neurological deficits noted cranial nerves II through XII grossly intact. DTRs 2+ bilaterally upper and lower extremities. Strength 5 out of 5 in bilateral upper and lower extremities. Musculoskeletal: No warm swelling or erythema around joints, no spinal tenderness noted. Psychiatric: Patient alert and oriented x3, good insight and cognition, good recent to remote recall. Lymph node survey: No cervical axillary or inguinal lymphadenopathy noted.   Data Reviewed: Basic Metabolic Panel:  Recent Labs Lab 03/22/13 [phone]  03/23/13 0610 03/24/13 0615 03/25/13 0530 03/26/13 0435  NA 136* 142 143 140 139  K 3.6* 4.4 4.6 4.2 4.3  CL 101 110 111 108 105  CO2 21 20 21 22 23   GLUCOSE 130* 126* 118* 110* 127*  BUN 8 8 5* 7 6  CREATININE 0.65 0.58 0.54 0.53 0.57  CALCIUM 8.8 8.8 8.9 9.0 9.0  MG 1.9  --   --   --   --    Liver Function Tests:  Recent Labs Lab 03/22/13 0615 03/23/13 0610 03/24/13 0615 03/25/13 0530  03/26/13 0435  AST 31 34 41* 40* 38*  ALT 20 19 21 21 21   ALKPHOS 90 81 86 94 95  BILITOT 8.1* 6.2* 6.1* 5.2* 5.2*  PROT 7.8 7.1 7.5 7.4 7.6  ALBUMIN 4.3 3.8 4.0 3.8 4.2   No results found for this basename: LIPASE, AMYLASE,  in the last 168 hours No results found for this basename: AMMONIA,  in the last 168 hours CBC:  Recent Labs Lab 03/20/13 1029 03/22/13 0615 03/23/13 0610 03/24/13 0615 03/25/13 0530 03/26/13 0435  WBC 19.9* 27.2* 26.3* 20.9* 20.4* 21.3*  NEUTROABS 15.1* 20.1* 18.8*  --   --   --   HGB 8.7* 7.8* 6.6* 7.0* 6.7* 6.7*  HCT 24.9* 21.8* 18.7* 19.6* 19.4* 19.3*  MCV 92.6 90.5 89.9 89.1 90.2 90.2  PLT 485* 461* 407* 459* 463* 490*   Cardiac Enzymes: No results found for this basename: CKTOTAL, CKMB, CKMBINDEX, TROPONINI,  in the last 168 hours BNP (last 3 results)  Recent Labs  11/13/12 1826  PROBNP 106.3   CBG: No results found for this basename: GLUCAP,  in the last 168 hours  Recent Results (from the past 240 hour(s))  CULTURE, EXPECTORATED SPUTUM-ASSESSMENT     Status: None   Collection Time    03/22/13  2:42 PM      Result Value Ref Range Status   Specimen Description SPUTUM   Final   Special Requests Normal   Final   Sputum evaluation     Final   Value: THIS SPECIMEN IS ACCEPTABLE. RESPIRATORY CULTURE REPORT TO FOLLOW.   Report Status 03/22/2013 FINAL   Final  CULTURE, RESPIRATORY (NON-EXPECTORATED)     Status: None   Collection Time    03/22/13  2:42 PM      Result Value Ref Range Status   Specimen Description SPUTUM   Final   Special Requests NONE   Final   Gram Stain     Final   Value: MODERATE WBC PRESENT,BOTH PMN AND MONONUCLEAR     MODERATE SQUAMOUS EPITHELIAL CELLS PRESENT     MODERATE GRAM POSITIVE COCCI IN PAIRS     FEW GRAM NEGATIVE RODS     MODERATE YEAST   Culture     Final   Value: MODERATE METHICILLIN RESISTANT STAPHYLOCOCCUS AUREUS     Note: RIFAMPIN AND GENTAMICIN SHOULD NOT BE USED AS SINGLE DRUGS FOR TREATMENT OF  STAPH INFECTIONS. This organism DOES NOT demonstrate inducible Clindamycin resistance in vitro. CRITICAL RESULT CALLED TO, READ BACK BY AND VERIFIED WITH: Marijo Conception RN 11AM      03/25/13 GUSTK     Performed at [facility]   Report Status 03/25/2013 FINAL   Final   Organism ID, Bacteria METHICILLIN RESISTANT STAPHYLOCOCCUS AUREUS   Final     Studies: Dg Chest 2 View  03/22/2013   CLINICAL DATA:  Former smoking history, leukocytosis, history of sickle cell disease  EXAM: CHEST  2 VIEW  COMPARISON:  Portable chest x-ray of 02/14/2012  FINDINGS: No active infiltrate or effusion is seen. Indistinct perihilar vasculature may indicate very minimal pulmonary vascular congestion. The heart is mildly enlarged and stable. A left-sided Port-A-Cath tip remains at the expected SVC-RA junction.  IMPRESSION: Stable mild cardiomegaly with minimal pulmonary vascular congestion.   Electronically Signed   By: Ivar Drape M.D.   On: 03/22/2013 11:20   Ct Angio Chest Pe W/cm &/or Wo Cm  03/26/2013   CLINICAL DATA:  Hypoxemia.  Known history of pulmonary embolism.  EXAM: CT ANGIOGRAPHY CHEST WITH CONTRAST  TECHNIQUE: Multidetector CT imaging of the chest was performed using the standard protocol during bolus administration of intravenous contrast. Multiplanar CT image reconstructions and MIPs were obtained to evaluate the vascular anatomy.  CONTRAST:  155mL OMNIPAQUE IOHEXOL 350 MG/ML SOLN  COMPARISON:  DG CHEST 2 VIEW dated 03/22/2013; CT CHEST W/CM dated 11/17/2012; CT ANGIO CHEST W/CM &/OR WO/CM dated 11/13/2012  FINDINGS: Technically adequate study without pulmonary embolus. Aorta and branch vessels are within normal limits. There is no axillary adenopathy. Left subclavian Port-A-Cath is unchanged with the tip in the right atrium. Cardiomegaly is present with right heart enlargement. There is also enlargement of the left and right pulmonary arteries suggesting pulmonary arterial hypertension. Bilateral gynecomastia  is incidentally noted.  There are scattered foci of peripheral ground-glass attenuation in both lungs, a finding which can be associated with acute chest syndrome and small vessel infarction with hemorrhagic edema. Bronchopneumonia and small vessel disease are in the differential considerations. Incidental imaging of the upper abdomen is grossly within normal limits. Mediastinal adenopathy appears similar to prior exam. Unchanged right hilar adenopathy. Notably, no pleural effusions. Sickle cell endplate infarcts are present in the thoracic spine with multiple Schmorl's nodes.  Review of the MIP images confirms the above findings.  IMPRESSION: 1. Negative for pulmonary embolus. 2. Scattered areas of ground-glass attenuation bilaterally in the lungs suggesting acute chest syndrome in this patient with sickle cell anemia. 3. Unchanged cardiomegaly it and mediastinal and hilar adenopathy. 4. Enlargement of the left and right pulmonary arteries and right heart enlargement suggesting pulmonary arterial hypertension.   Electronically Signed   By: Dereck Ligas M.D.   On: 03/26/2013 16:46    Scheduled Meds: . doxycycline (VIBRAMYCIN) IV  100 mg Intravenous Q12H  . folic acid  1 mg Oral q morning - 10a  . HYDROmorphone PCA 0.3 mg/mL   Intravenous 6 times per day  . hydroxyurea  500 mg Oral BID WC  . ketorolac  30 mg Intravenous 4 times per day  . morphine  15 mg Oral BID  . Rivaroxaban  20 mg Oral Q breakfast  . senna  1 tablet Oral Daily   Continuous Infusions: . dextrose 5 % and 0.9 % NaCl with KCl 40 mEq/L 20 mL/hr at 03/25/13 2008    Active Problems:   Acute sickle cell crisis   Sickle cell pain crisis

## 2013-03-26 NOTE — Progress Notes (Signed)
CRITICAL VALUE ALERT  Critical value received: Hgb 6.7  Date of notification:  03/26/13  Time of notification:  0504  Critical value read back:yes  Nurse who received alert:  Noreene Larsson RN  MD on call not notified: hgb is the same as yesterday, MD stated that order to transfuse will only be given if hgb is < 6.

## 2013-03-27 DIAGNOSIS — R0902 Hypoxemia: Secondary | ICD-10-CM | POA: Diagnosis not present

## 2013-03-27 DIAGNOSIS — J189 Pneumonia, unspecified organism: Secondary | ICD-10-CM | POA: Diagnosis not present

## 2013-03-27 DIAGNOSIS — E876 Hypokalemia: Secondary | ICD-10-CM | POA: Diagnosis not present

## 2013-03-27 DIAGNOSIS — I2782 Chronic pulmonary embolism: Secondary | ICD-10-CM | POA: Diagnosis not present

## 2013-03-27 DIAGNOSIS — D5700 Hb-SS disease with crisis, unspecified: Secondary | ICD-10-CM | POA: Diagnosis not present

## 2013-03-27 DIAGNOSIS — D5701 Hb-SS disease with acute chest syndrome: Secondary | ICD-10-CM | POA: Diagnosis not present

## 2013-03-27 LAB — MAGNESIUM: MAGNESIUM: 1.6 mg/dL (ref 1.5–2.5)

## 2013-03-27 LAB — COMPREHENSIVE METABOLIC PANEL
ALBUMIN: 3.6 g/dL (ref 3.5–5.2)
ALT: 21 U/L (ref 0–53)
AST: 35 U/L (ref 0–37)
Alkaline Phosphatase: 86 U/L (ref 39–117)
BUN: 6 mg/dL (ref 6–23)
CALCIUM: 8.6 mg/dL (ref 8.4–10.5)
CO2: 23 mEq/L (ref 19–32)
CREATININE: 0.57 mg/dL (ref 0.50–1.35)
Chloride: 104 mEq/L (ref 96–112)
GFR calc Af Amer: >90 mL/min (ref >90)
GFR calc non Af Amer: >90 mL/min (ref >90)
Glucose, Bld: 113 mg/dL — ABNORMAL HIGH (ref 70–99)
Potassium: 4 mEq/L (ref 3.7–5.3)
Sodium: 139 mEq/L (ref 137–147)
TOTAL PROTEIN: 6.9 g/dL (ref 6.0–8.3)
Total Bilirubin: 3.8 mg/dL — ABNORMAL HIGH (ref 0.3–1.2)

## 2013-03-27 LAB — CBC WITH DIFFERENTIAL/PLATELET
BASOS ABS: 0.2 10*3/uL — AB (ref 0.0–0.1)
Basophils Relative: 1 % (ref 0–1)
EOS ABS: 1 10*3/uL — AB (ref 0.0–0.7)
Eosinophils Relative: 6 % — ABNORMAL HIGH (ref 0–5)
HCT: 22.6 % — ABNORMAL LOW (ref 39.0–52.0)
Hemoglobin: 8 g/dL — ABNORMAL LOW (ref 13.0–17.0)
LYMPHS ABS: 3.4 10*3/uL (ref 0.7–4.0)
Lymphocytes Relative: 20 % (ref 12–46)
MCH: 30.8 pg (ref 26.0–34.0)
MCHC: 35.4 g/dL (ref 30.0–36.0)
MCV: 86.9 fL (ref 78.0–100.0)
MONO ABS: 2 10*3/uL — AB (ref 0.1–1.0)
Monocytes Relative: 12 % (ref 3–12)
Neutro Abs: 10.3 10*3/uL — ABNORMAL HIGH (ref 1.7–7.7)
Neutrophils Relative %: 61 % (ref 43–77)
PLATELETS: 522 10*3/uL — AB (ref 150–400)
RBC: 2.6 MIL/uL — ABNORMAL LOW (ref 4.22–5.81)
RDW: 19.2 % — AB (ref 11.5–15.5)
WBC: 16.9 10*3/uL — ABNORMAL HIGH (ref 4.0–10.5)

## 2013-03-27 LAB — TYPE AND SCREEN
ABO/RH(D): O POS
Antibody Screen: NEGATIVE
UNIT DIVISION: 0

## 2013-03-27 LAB — PREPARE RBC (CROSSMATCH)

## 2013-03-27 MED ORDER — MAGNESIUM SULFATE 40 MG/ML IJ SOLN
2.0000 g | Freq: Once | INTRAMUSCULAR | Status: AC
  Administered 2013-03-27: 2 g via INTRAVENOUS
  Filled 2013-03-27: qty 50

## 2013-03-27 MED ORDER — POLYETHYLENE GLYCOL 3350 17 G PO PACK
17.0000 g | PACK | Freq: Every day | ORAL | Status: DC
  Administered 2013-03-27: 17 g via ORAL
  Filled 2013-03-27 (×4): qty 1

## 2013-03-27 NOTE — Progress Notes (Addendum)
SICKLE CELL SERVICE PROGRESS NOTE  [name] OJJ:[id number] DOB: 1980-12-11 DOA: 03/20/2013 PCP: [name], MD  Assessment/Plan: Active Problems:   Acute sickle cell crisis   Sickle cell pain crisis  1. Acute Chest Syndrome: Pt oxygenating better today. Oxygen requirement improved after transfusion. Will continue to observe in SDU through today. If patient continues to do well overnight then can transfer to non-tele bed in the morning. 2. Acute vaso-occlusive crisis: Pt's pain improved. Continue PCA at current dosing and continue with clinician assisted doses.Continue Toradol and MS Contin.  3. Leukocytosis: Improved. Leukocytosis is likely secondary to Acute Chest Syndrome and inflammatory cascade. Will recheck tomorrow. 4. Anemia: Pt has a baseline Hb of 7.5. He is currently at his baseline and improving. However in light of Acute Chest, may need to transfuse another Unit of blood if unable to wean Oxygen. 5. Constipation: Continue Senakot-S and add Miralax.  Discussed the above with patient and nurse.  Code Status: Full Code Family Communication: N/A Disposition Plan: Transfer to non-tele if stable tomorrow.  [name] A.  Pager [phone]. If 7PM-7AM, please contact night-coverage.  03/27/2013, 10:12 AM  LOS: 7 days   Brief narrative:   Consultants:  Pt presented to [facility] with c/o pain consistent with acute crisis. His pain was unresolved wit treatment in the [facility] and he was transferred to the Hospital  For further treatment.   Procedures:  None  Antibiotics:  Doxycycline 2/24 >>  HPI/Subjective: Pt states that pain is 7/10 and now present in both sides of his ribs. He has no pleurisy but he feels that he has gotten no relief whatsoever even with the enormous amounts of analgesics. Has not had a BM for several days.  Objective: Filed Vitals:   03/27/13 0342 03/27/13 0440 03/27/13 0500 03/27/13 0800  BP:  156/98 128/93 143/125   Pulse:  63 42 74  Temp:  98.4 F (36.9 C)  98.6 F (37 C)  TempSrc:  Oral  Oral  Resp: 16 15 14 17   Height:      Weight:      SpO2: 99% 100% 88% 97%   Weight change:   Intake/Output Summary (Last 24 hours) at 03/27/13 1012 Last data filed at 03/27/13 0800  Gross per 24 hour  Intake 1761.8 ml  Output    550 ml  Net 1211.8 ml    General: Alert, awake, oriented x3, in moderately acute distress. Non-toxic appearing HEENT: [place]/AT PEERL, EOMI, icterus improved since yesterday Heart: Regular rate and rhythm, without murmurs, rubs, gallops.  Lungs: Clear to auscultation, no wheezing or rhonchi noted. No increased vocal fremitus resonant to percussion  Abdomen: Soft, nontender, nondistended, positive bowel sounds, no masses no hepatosplenomegaly noted.  Neuro: No focal neurological deficits noted cranial nerves II through XII grossly intact. Strength normal in bilateral upper and lower extremities. Musculoskeletal: No warm swelling or erythema around joints, no spinal tenderness noted. Psychiatric: Patient alert and oriented x3, good insight and cognition, good recent to remote recall. Lymph node survey: No cervical axillary or inguinal lymphadenopathy noted.   Data Reviewed: Basic Metabolic Panel:  Recent Labs Lab 03/22/13 0615 03/23/13 0610 03/24/13 0615 03/25/13 0530 03/26/13 0435 03/27/13 0907  NA 136* 142 143 140 139 139  K 3.6* 4.4 4.6 4.2 4.3 4.0  CL 101 110 111 108 105 104  CO2 21 20 21 22 23 23   GLUCOSE 130* 126* 118* 110* 127* 113*  BUN 8 8 5* 7 6 6   CREATININE 0.65 0.58  0.54 0.53 0.57 0.57  CALCIUM 8.8 8.8 8.9 9.0 9.0 8.6  MG 1.9  --   --   --   --  1.6   Liver Function Tests:  Recent Labs Lab 03/23/13 0610 03/24/13 0615 03/25/13 0530 03/26/13 0435 03/27/13 0907  AST 34 41* 40* 38* 35  ALT 19 21 21 21 21   ALKPHOS 81 86 94 95 86  BILITOT 6.2* 6.1* 5.2* 5.2* 3.8*  PROT 7.1 7.5 7.4 7.6 6.9  ALBUMIN 3.8 4.0 3.8 4.2 3.6   No results found for this  basename: LIPASE, AMYLASE,  in the last 168 hours No results found for this basename: AMMONIA,  in the last 168 hours CBC:  Recent Labs Lab 03/20/13 1029 03/22/13 0615 03/23/13 0610 03/24/13 0615 03/25/13 0530 03/26/13 0435 03/27/13 0907  WBC 19.9* 27.2* 26.3* 20.9* 20.4* 21.3* 16.9*  NEUTROABS 15.1* 20.1* 18.8*  --   --   --  10.3*  HGB 8.7* 7.8* 6.6* 7.0* 6.7* 6.7* 8.0*  HCT 24.9* 21.8* 18.7* 19.6* 19.4* 19.3* 22.6*  MCV 92.6 90.5 89.9 89.1 90.2 90.2 86.9  PLT 485* 461* 407* 459* 463* 490* 522*   Cardiac Enzymes: No results found for this basename: CKTOTAL, CKMB, CKMBINDEX, TROPONINI,  in the last 168 hours BNP (last 3 results)  Recent Labs  11/13/12 1826  PROBNP 106.3   CBG: No results found for this basename: GLUCAP,  in the last 168 hours  Recent Results (from the past 240 hour(s))  CULTURE, EXPECTORATED SPUTUM-ASSESSMENT     Status: None   Collection Time    03/22/13  2:42 PM      Result Value Ref Range Status   Specimen Description SPUTUM   Final   Special Requests Normal   Final   Sputum evaluation     Final   Value: THIS SPECIMEN IS ACCEPTABLE. RESPIRATORY CULTURE REPORT TO FOLLOW.   Report Status 03/22/2013 FINAL   Final  CULTURE, RESPIRATORY (NON-EXPECTORATED)     Status: None   Collection Time    03/22/13  2:42 PM      Result Value Ref Range Status   Specimen Description SPUTUM   Final   Special Requests NONE   Final   Gram Stain     Final   Value: MODERATE WBC PRESENT,BOTH PMN AND MONONUCLEAR     MODERATE SQUAMOUS EPITHELIAL CELLS PRESENT     MODERATE GRAM POSITIVE COCCI IN PAIRS     FEW GRAM NEGATIVE RODS     MODERATE YEAST   Culture     Final   Value: MODERATE METHICILLIN RESISTANT STAPHYLOCOCCUS AUREUS     Note: RIFAMPIN AND GENTAMICIN SHOULD NOT BE USED AS SINGLE DRUGS FOR TREATMENT OF STAPH INFECTIONS. This organism DOES NOT demonstrate inducible Clindamycin resistance in vitro. CRITICAL RESULT CALLED TO, READ BACK BY AND VERIFIED WITH: Marijo Conception RN 11AM      03/25/13 GUSTK     Performed at [facility]   Report Status 03/25/2013 FINAL   Final   Organism ID, Bacteria METHICILLIN RESISTANT STAPHYLOCOCCUS AUREUS   Final  MRSA PCR SCREENING     Status: Abnormal   Collection Time    03/26/13  8:32 PM      Result Value Ref Range Status   MRSA by PCR POSITIVE (*) NEGATIVE Final   Comment:            The GeneXpert MRSA Assay (FDA     approved for NASAL specimens     only),  is one component of a     comprehensive MRSA colonization     surveillance program. It is not     intended to diagnose MRSA     infection nor to guide or     monitor treatment for     MRSA infections.     RESULT CALLED TO, READ BACK BY AND VERIFIED WITH:     REEVES,M RN @2307  ON 02.24.2015 BY MCREYNOLDS,B     Studies: Dg Chest 2 View  03/22/2013   CLINICAL DATA:  Former smoking history, leukocytosis, history of sickle cell disease  EXAM: CHEST  2 VIEW  COMPARISON:  Portable chest x-ray of 02/14/2012  FINDINGS: No active infiltrate or effusion is seen. Indistinct perihilar vasculature may indicate very minimal pulmonary vascular congestion. The heart is mildly enlarged and stable. A left-sided Port-A-Cath tip remains at the expected SVC-RA junction.  IMPRESSION: Stable mild cardiomegaly with minimal pulmonary vascular congestion.   Electronically Signed   By: Ivar Drape M.D.   On: 03/22/2013 11:20   Ct Angio Chest Pe W/cm &/or Wo Cm  03/26/2013   CLINICAL DATA:  Hypoxemia.  Known history of pulmonary embolism.  EXAM: CT ANGIOGRAPHY CHEST WITH CONTRAST  TECHNIQUE: Multidetector CT imaging of the chest was performed using the standard protocol during bolus administration of intravenous contrast. Multiplanar CT image reconstructions and MIPs were obtained to evaluate the vascular anatomy.  CONTRAST:  182mL OMNIPAQUE IOHEXOL 350 MG/ML SOLN  COMPARISON:  DG CHEST 2 VIEW dated 03/22/2013; CT CHEST W/CM dated 11/17/2012; CT ANGIO CHEST W/CM &/OR WO/CM dated  11/13/2012  FINDINGS: Technically adequate study without pulmonary embolus. Aorta and branch vessels are within normal limits. There is no axillary adenopathy. Left subclavian Port-A-Cath is unchanged with the tip in the right atrium. Cardiomegaly is present with right heart enlargement. There is also enlargement of the left and right pulmonary arteries suggesting pulmonary arterial hypertension. Bilateral gynecomastia is incidentally noted.  There are scattered foci of peripheral ground-glass attenuation in both lungs, a finding which can be associated with acute chest syndrome and small vessel infarction with hemorrhagic edema. Bronchopneumonia and small vessel disease are in the differential considerations. Incidental imaging of the upper abdomen is grossly within normal limits. Mediastinal adenopathy appears similar to prior exam. Unchanged right hilar adenopathy. Notably, no pleural effusions. Sickle cell endplate infarcts are present in the thoracic spine with multiple Schmorl's nodes.  Review of the MIP images confirms the above findings.  IMPRESSION: 1. Negative for pulmonary embolus. 2. Scattered areas of ground-glass attenuation bilaterally in the lungs suggesting acute chest syndrome in this patient with sickle cell anemia. 3. Unchanged cardiomegaly it and mediastinal and hilar adenopathy. 4. Enlargement of the left and right pulmonary arteries and right heart enlargement suggesting pulmonary arterial hypertension.   Electronically Signed   By: Dereck Ligas M.D.   On: 03/26/2013 16:46    Scheduled Meds: . Chlorhexidine Gluconate Cloth  6 each Topical Q0600  . doxycycline (VIBRAMYCIN) IV  100 mg Intravenous Q12H  . folic acid  1 mg Oral q morning - 10a  . HYDROmorphone PCA 0.3 mg/mL   Intravenous 6 times per day  . hydroxyurea  500 mg Oral BID WC  . morphine  15 mg Oral BID  . mupirocin ointment  1 application Nasal BID  . Rivaroxaban  20 mg Oral Q breakfast  . senna  1 tablet Oral Daily    Continuous Infusions: . dextrose 5 % and 0.9 % NaCl with KCl 40 mEq/L  20 mL/hr at 03/27/13 0000    Time spent 45 minutes

## 2013-03-28 ENCOUNTER — Inpatient Hospital Stay (HOSPITAL_COMMUNITY): Payer: Medicare Other

## 2013-03-28 DIAGNOSIS — D5701 Hb-SS disease with acute chest syndrome: Secondary | ICD-10-CM | POA: Diagnosis not present

## 2013-03-28 DIAGNOSIS — J189 Pneumonia, unspecified organism: Secondary | ICD-10-CM | POA: Diagnosis not present

## 2013-03-28 DIAGNOSIS — I2782 Chronic pulmonary embolism: Secondary | ICD-10-CM | POA: Diagnosis not present

## 2013-03-28 DIAGNOSIS — R0902 Hypoxemia: Secondary | ICD-10-CM | POA: Diagnosis not present

## 2013-03-28 DIAGNOSIS — E876 Hypokalemia: Secondary | ICD-10-CM | POA: Diagnosis not present

## 2013-03-28 DIAGNOSIS — D5700 Hb-SS disease with crisis, unspecified: Secondary | ICD-10-CM | POA: Diagnosis not present

## 2013-03-28 LAB — CBC WITH DIFFERENTIAL/PLATELET
BASOS ABS: 0.2 10*3/uL — AB (ref 0.0–0.1)
Basophils Relative: 1 % (ref 0–1)
EOS ABS: 0.8 10*3/uL — AB (ref 0.0–0.7)
EOS PCT: 4 % (ref 0–5)
HCT: 25.2 % — ABNORMAL LOW (ref 39.0–52.0)
Hemoglobin: 8.6 g/dL — ABNORMAL LOW (ref 13.0–17.0)
LYMPHS PCT: 14 % (ref 12–46)
Lymphs Abs: 2.8 10*3/uL (ref 0.7–4.0)
MCH: 30.2 pg (ref 26.0–34.0)
MCHC: 34.1 g/dL (ref 30.0–36.0)
MCV: 88.4 fL (ref 78.0–100.0)
Monocytes Absolute: 2.1 10*3/uL — ABNORMAL HIGH (ref 0.1–1.0)
Monocytes Relative: 11 % (ref 3–12)
NEUTROS PCT: 70 % (ref 43–77)
Neutro Abs: 13.6 10*3/uL — ABNORMAL HIGH (ref 1.7–7.7)
PLATELETS: 565 10*3/uL — AB (ref 150–400)
RBC: 2.85 MIL/uL — ABNORMAL LOW (ref 4.22–5.81)
RDW: 20 % — AB (ref 11.5–15.5)
WBC: 19.4 10*3/uL — ABNORMAL HIGH (ref 4.0–10.5)

## 2013-03-28 LAB — TYPE AND SCREEN
ABO/RH(D): O POS
Antibody Screen: NEGATIVE
UNIT DIVISION: 0

## 2013-03-28 LAB — CREATININE, SERUM
Creatinine, Ser: 0.49 mg/dL — ABNORMAL LOW (ref 0.50–1.35)
GFR calc Af Amer: >90 mL/min (ref >90)
GFR calc non Af Amer: >90 mL/min (ref >90)

## 2013-03-28 NOTE — Progress Notes (Signed)
CARE MANAGEMENT NOTE 03/28/2013  Patient:  [name], [name]   Account Number:  [id number]  Date Initiated:  03/21/2013  Documentation initiated by:  Kellina Dreese,TYMEEKA  Subjective/Objective Assessment:   33 yo male admitted with sickle cell crisis. PCP: Dr.[name]     Action/Plan:   Home when stable   Anticipated DC Date:  03/31/2013   Anticipated DC Plan:  [place]  CM consult      Choice offered to / List presented to:  NA   DME arranged  NA      DME agency  NA     [place] arranged  NA      [place] agency  NA   Status of service:  In process, will continue to follow Medicare Important Message given?   (If response is "NO", the following Medicare IM given date fields will be blank) Date Medicare IM given:   Date Additional Medicare IM given:    Discharge Disposition:    Per UR Regulation:  Reviewed for med. necessity/level of care/duration of stay  If discussed at [place] of Stay Meetings, dates discussed:   03/28/2013    Comments:  [id number]/HRCBUL Sanaia Jasso,RN,BSN,CCM [phone] Chart review for discharge needs and patient progress. patient transitioning from sdu to acute level of care 022615/ iv pca being used, hgb improved, o2 desat. with exertion remains.   03/21/13 [place] Chart review for utilization of services. No needs identified at this time. Identified PCP and pharmacy.

## 2013-03-28 NOTE — Progress Notes (Signed)
Subjective: A 33 yo with acute chest syndrome, pneumonia as well as sickle cell painful crisis. Patient is doing better. Still requires oxygen in the ICU. No fever, no NVD. Has been trying to function. 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 His SOB is at its baseline. No other complaints.  Objective: Vital signs in last 24 hours: Temp:  [98 F (36.7 C)-98.9 F (37.2 C)] 98.1 F (36.7 C) (02/26 1200) Pulse Rate:  [58-96] 67 (02/26 1200) Resp:  [11-22] 14 (02/26 1200) BP: (114-143)/(66-104) 134/85 mmHg (02/26 1200) SpO2:  [88 %-100 %] 98 % (02/26 1200) Weight change:  Last BM Date: 03/25/13  Intake/Output from previous day: 02/25 0701 - 02/26 0700 In: 2305 [P.O.:1220; I.V.:485; IV Piggyback:600] Out: 3086 [Urine:4225] Intake/Output this shift:    General appearance: alert, cooperative and no distress Eyes: conjunctivae/corneas clear. PERRL, EOM's intact. Fundi benign. Throat: lips, mucosa, and tongue normal; teeth and gums normal Neck: no adenopathy, no carotid bruit, no JVD, supple, symmetrical, trachea midline and thyroid not enlarged, symmetric, no tenderness/mass/nodules Back: symmetric, no curvature. ROM normal. No CVA tenderness. Resp: clear to auscultation bilaterally Chest wall: no tenderness Cardio: regular rate and rhythm, S1, S2 normal, no murmur, click, rub or gallop GI: soft, non-tender; bowel sounds normal; no masses,  no organomegaly Extremities: extremities normal, atraumatic, no cyanosis or edema Pulses: 2+ and symmetric Skin: Skin color, texture, turgor normal. No rashes or lesions Neurologic: Grossly normal  Lab Results:  Recent Labs  03/27/13 0907 03/28/13 0420  WBC 16.9* 19.4*  HGB 8.0* 8.6*  HCT 22.6* 25.2*  PLT 522* 565*   BMET  Recent Labs  03/26/13 0435 03/27/13 0907 03/28/13 0420  NA 139 139  --   K 4.3 4.0  --   CL 105 104  --   CO2 23 23  --   GLUCOSE 127* 113*  --   BUN 6 6  --   CREATININE 0.57 0.57 0.49*   CALCIUM 9.0 8.6  --     Studies/Results: Ct Angio Chest Pe W/cm &/or Wo Cm  03/26/2013   CLINICAL DATA:  Hypoxemia.  Known history of pulmonary embolism.  EXAM: CT ANGIOGRAPHY CHEST WITH CONTRAST  TECHNIQUE: Multidetector CT imaging of the chest was performed using the standard protocol during bolus administration of intravenous contrast. Multiplanar CT image reconstructions and MIPs were obtained to evaluate the vascular anatomy.  CONTRAST:  188mL OMNIPAQUE IOHEXOL 350 MG/ML SOLN  COMPARISON:  DG CHEST 2 VIEW dated 03/22/2013; CT CHEST W/CM dated 11/17/2012; CT ANGIO CHEST W/CM &/OR WO/CM dated 11/13/2012  FINDINGS: Technically adequate study without pulmonary embolus. Aorta and branch vessels are within normal limits. There is no axillary adenopathy. Left subclavian Port-A-Cath is unchanged with the tip in the right atrium. Cardiomegaly is present with right heart enlargement. There is also enlargement of the left and right pulmonary arteries suggesting pulmonary arterial hypertension. Bilateral gynecomastia is incidentally noted.  There are scattered foci of peripheral ground-glass attenuation in both lungs, a finding which can be associated with acute chest syndrome and small vessel infarction with hemorrhagic edema. Bronchopneumonia and small vessel disease are in the differential considerations. Incidental imaging of the upper abdomen is grossly within normal limits. Mediastinal adenopathy appears similar to prior exam. Unchanged right hilar adenopathy. Notably, no pleural effusions. Sickle cell endplate infarcts are present in the thoracic spine with multiple Schmorl's nodes.  Review of the MIP images confirms the above findings.  IMPRESSION: 1. Negative for pulmonary embolus. 2. Scattered areas  of ground-glass attenuation bilaterally in the lungs suggesting acute chest syndrome in this patient with sickle cell anemia. 3. Unchanged cardiomegaly it and mediastinal and hilar adenopathy. 4. Enlargement of  the left and right pulmonary arteries and right heart enlargement suggesting pulmonary arterial hypertension.   Electronically Signed   By: Dereck Ligas M.D.   On: 03/26/2013 16:46   Dg Chest Port 1 View  03/28/2013   CLINICAL DATA:  Hypoxia  EXAM: PORTABLE CHEST - 1 VIEW  COMPARISON:  CT ANGIO CHEST W/CM &/OR WO/CM dated 03/26/2013; DG CHEST 2 VIEW dated 03/22/2013  FINDINGS: Stable left subclavian Port-A-Cath. Cardiomegaly. Clear lungs. No pneumothorax.  IMPRESSION: No active disease.   Electronically Signed   By: Maryclare Bean M.D.   On: 03/28/2013 07:43    Medications: I have reviewed the patient's current medications.  Assessment/Plan: A 33 yo man admitted with acute chest syndrome due to pneumonia.   #1  Acute Chest syndrome: patient has been doing better. No respiratory distress. No fever. Patient will need to continue antibiotics and possible home oxygen.  #2 Sickle Cell Painful Crisis: Still on PCA pump. We wil continue. Will keep on toradol. Give boluses if needed.  #3 Hx of Thromboembolism: continue Xarelto.  #4 Sickle Cell Anemia: H/H stable.  #5 Mood Disorder: Stable.    LOS: 8 days   Negar Sieler,[name] 03/28/2013, 12:36 PM

## 2013-03-29 DIAGNOSIS — D5701 Hb-SS disease with acute chest syndrome: Secondary | ICD-10-CM | POA: Diagnosis not present

## 2013-03-29 DIAGNOSIS — J189 Pneumonia, unspecified organism: Secondary | ICD-10-CM | POA: Diagnosis not present

## 2013-03-29 DIAGNOSIS — R0902 Hypoxemia: Secondary | ICD-10-CM | POA: Diagnosis not present

## 2013-03-29 DIAGNOSIS — D5700 Hb-SS disease with crisis, unspecified: Secondary | ICD-10-CM | POA: Diagnosis not present

## 2013-03-29 DIAGNOSIS — I2782 Chronic pulmonary embolism: Secondary | ICD-10-CM | POA: Diagnosis not present

## 2013-03-29 DIAGNOSIS — E876 Hypokalemia: Secondary | ICD-10-CM | POA: Diagnosis not present

## 2013-03-29 LAB — CBC WITH DIFFERENTIAL/PLATELET
Basophils Absolute: 0.2 10*3/uL — ABNORMAL HIGH (ref 0.0–0.1)
Basophils Relative: 1 % (ref 0–1)
EOS PCT: 4 % (ref 0–5)
Eosinophils Absolute: 0.8 10*3/uL — ABNORMAL HIGH (ref 0.0–0.7)
HEMATOCRIT: 25.4 % — AB (ref 39.0–52.0)
Hemoglobin: 8.7 g/dL — ABNORMAL LOW (ref 13.0–17.0)
Lymphocytes Relative: 21 % (ref 12–46)
Lymphs Abs: 4.2 10*3/uL — ABNORMAL HIGH (ref 0.7–4.0)
MCH: 29.9 pg (ref 26.0–34.0)
MCHC: 34.3 g/dL (ref 30.0–36.0)
MCV: 87.3 fL (ref 78.0–100.0)
MONOS PCT: 12 % (ref 3–12)
Monocytes Absolute: 2.4 10*3/uL — ABNORMAL HIGH (ref 0.1–1.0)
Neutro Abs: 12.2 10*3/uL — ABNORMAL HIGH (ref 1.7–7.7)
Neutrophils Relative %: 62 % (ref 43–77)
PLATELETS: 610 10*3/uL — AB (ref 150–400)
RBC: 2.91 MIL/uL — AB (ref 4.22–5.81)
RDW: 19.5 % — ABNORMAL HIGH (ref 11.5–15.5)
WBC: 19.8 10*3/uL — AB (ref 4.0–10.5)

## 2013-03-29 LAB — COMPREHENSIVE METABOLIC PANEL
ALBUMIN: 4.1 g/dL (ref 3.5–5.2)
ALK PHOS: 102 U/L (ref 39–117)
ALT: 20 U/L (ref 0–53)
AST: 38 U/L — ABNORMAL HIGH (ref 0–37)
BILIRUBIN TOTAL: 5.4 mg/dL — AB (ref 0.3–1.2)
BUN: 8 mg/dL (ref 6–23)
CHLORIDE: 101 meq/L (ref 96–112)
CO2: 24 mEq/L (ref 19–32)
Calcium: 9.2 mg/dL (ref 8.4–10.5)
Creatinine, Ser: 0.53 mg/dL (ref 0.50–1.35)
GFR calc Af Amer: >90 mL/min (ref >90)
GFR calc non Af Amer: >90 mL/min (ref >90)
Glucose, Bld: 111 mg/dL — ABNORMAL HIGH (ref 70–99)
POTASSIUM: 4.3 meq/L (ref 3.7–5.3)
Sodium: 137 mEq/L (ref 137–147)
Total Protein: 7.8 g/dL (ref 6.0–8.3)

## 2013-03-29 NOTE — Progress Notes (Signed)
Subjective: Patient is doing better today. He is requiring 1L of oxygen only. No fever, no SOB. Getting adequate pain relief with the dilaudid PCA. No other complaints.  Objective: Vital signs in last 24 hours: Temp:  [98.4 F (36.9 C)-98.7 F (37.1 C)] 98.7 F (37.1 C) (02/27 1811) Pulse Rate:  [71-88] 88 (02/27 1811) Resp:  [15-20] 18 (02/27 1811) BP: (114-131)/(36-79) 116/79 mmHg (02/27 1811) SpO2:  [93 %-100 %] 98 % (02/27 1811) FiO2 (%):  [95 %-96 %] 96 % (02/27 1631) Weight:  [78.019 kg (172 lb)] 78.019 kg (172 lb) (02/26 2105) Weight change:  Last BM Date: 03/28/13  Intake/Output from previous day: 02/26 0701 - 02/27 0700 In: 1226.1 [P.O.:540; I.V.:636.1; IV Piggyback:50] Out: 0973 [Urine:2350] Intake/Output this shift:    General appearance: alert, cooperative and no distress Eyes: conjunctivae/corneas clear. PERRL, EOM's intact. Fundi benign. Neck: no adenopathy, no carotid bruit, no JVD, supple, symmetrical, trachea midline and thyroid not enlarged, symmetric, no tenderness/mass/nodules Back: symmetric, no curvature. ROM normal. No CVA tenderness. Resp: clear to auscultation bilaterally Chest wall: no tenderness Cardio: regular rate and rhythm, S1, S2 normal, no murmur, click, rub or gallop GI: soft, non-tender; bowel sounds normal; no masses,  no organomegaly Extremities: extremities normal, atraumatic, no cyanosis or edema Pulses: 2+ and symmetric Skin: Skin color, texture, turgor normal. No rashes or lesions Neurologic: Grossly normal  Lab Results:  Recent Labs  03/28/13 0420 03/29/13 0333  WBC 19.4* 19.8*  HGB 8.6* 8.7*  HCT 25.2* 25.4*  PLT 565* 610*   BMET  Recent Labs  03/27/13 0907 03/28/13 0420 03/29/13 0333  NA 139  --  137  K 4.0  --  4.3  CL 104  --  101  CO2 23  --  24  GLUCOSE 113*  --  111*  BUN 6  --  8  CREATININE 0.57 0.49* 0.53  CALCIUM 8.6  --  9.2    Studies/Results: Dg Chest Port 1 View  03/28/2013   CLINICAL DATA:   Hypoxia  EXAM: PORTABLE CHEST - 1 VIEW  COMPARISON:  CT ANGIO CHEST W/CM &/OR WO/CM dated 03/26/2013; DG CHEST 2 VIEW dated 03/22/2013  FINDINGS: Stable left subclavian Port-A-Cath. Cardiomegaly. Clear lungs. No pneumothorax.  IMPRESSION: No active disease.   Electronically Signed   By: Maryclare Bean M.D.   On: 03/28/2013 07:43    Medications: I have reviewed the patient's current medications.  Assessment/Plan: A 33 yo admitted with acute chest syndrome and Sickle cell Painful crisis.  #1 Acute Chest Syndrome: Seems resolved. Patient will be titrated off oxygen but continue with antibiotics for 10 days.  #2 Pneumonia: continue treatment.  #3 Sickle cell anemia: H/H stable.  #4 Hx of Thromboembolism: Continue Xarelto.  #5 Mood Disorder: Good mood now.   LOS: 9 days   [name],[name] 03/29/2013, 7:20 PM

## 2013-03-30 DIAGNOSIS — D5700 Hb-SS disease with crisis, unspecified: Secondary | ICD-10-CM | POA: Diagnosis not present

## 2013-03-30 MED ORDER — ZOLPIDEM TARTRATE 10 MG PO TABS: 10.0000 mg | ORAL_TABLET | Freq: Every evening | ORAL | Status: DC | PRN

## 2013-03-30 MED ORDER — CELECOXIB 200 MG PO CAPS: 200.0000 mg | ORAL_CAPSULE | Freq: Two times a day (BID) | ORAL | Status: DC

## 2013-03-30 MED ORDER — DOXYCYCLINE HYCLATE 50 MG PO CAPS: 100.0000 mg | ORAL_CAPSULE | Freq: Two times a day (BID) | ORAL | Status: DC

## 2013-03-30 MED ORDER — MORPHINE SULFATE ER 15 MG PO TBCR: 15.0000 mg | EXTENDED_RELEASE_TABLET | Freq: Two times a day (BID) | ORAL | Status: DC

## 2013-03-30 MED ORDER — HYDROMORPHONE HCL 4 MG PO TABS: 4.0000 mg | ORAL_TABLET | ORAL | Status: DC | PRN

## 2013-03-30 MED ORDER — HYDROXYUREA 500 MG PO CAPS: 500.0000 mg | ORAL_CAPSULE | Freq: Two times a day (BID) | ORAL | Status: DC

## 2013-03-30 MED ORDER — POTASSIUM CHLORIDE CRYS ER 20 MEQ PO TBCR: 20.0000 meq | EXTENDED_RELEASE_TABLET | Freq: Every morning | ORAL | Status: DC

## 2013-03-30 NOTE — Discharge Summary (Signed)
Physician Discharge Summary  Patient ID: [name] MRN: [id number] DOB/AGE: Aug 29, 1980 33 y.o.  Admit date: 03/20/2013 Discharge date: 03/30/2013  Admission Diagnoses:  Discharge Diagnoses:  Active Problems:   Acute sickle cell crisis   Sickle cell pain crisis   Discharged Condition: good  Hospital Course: Patient admitted with acute chest syndrome as well as sickle cell painful crisis. Was in the ICU with Oxygen, ABx as well as CCM involvement. Patient did well and has improved. He was titrated off Oxygen prior to discharge. He was also on Dilaudid PCA at the time. Repeat CXR shows no pneumonia. Patient was then discharged home on Levaaquin to treat for 10 days total. He will resume his Pain medications. Prescriptions were given for all his medications at the time of discharge. He will follow up with Dr Zigmund Daniel on 05/15/2013.  Consults: pulmonary/intensive care  Significant Diagnostic Studies: labs: Multiple, serial CBCs and CMPs checked mostly stable. Chest  Treatments: IV hydration and analgesia: Dilaudid  Discharge Exam: Blood pressure 124/72, pulse 70, temperature 98.5 F (36.9 C), temperature source Oral, resp. rate 14, height 6' (1.829 m), weight 78.019 kg (172 lb), SpO2 97.00%. General appearance: alert, cooperative and no distress Eyes: conjunctivae/corneas clear. PERRL, EOM's intact. Fundi benign. Neck: no adenopathy, no carotid bruit, no JVD, supple, symmetrical, trachea midline and thyroid not enlarged, symmetric, no tenderness/mass/nodules Back: symmetric, no curvature. ROM normal. No CVA tenderness. Resp: clear to auscultation bilaterally Chest wall: no tenderness Cardio: regular rate and rhythm, S1, S2 normal, no murmur, click, rub or gallop GI: soft, non-tender; bowel sounds normal; no masses,  no organomegaly Extremities: extremities normal, atraumatic, no cyanosis or edema Pulses: 2+ and symmetric Skin: Skin color, texture, turgor normal. No rashes or  lesions Neurologic: Grossly normal  Disposition: 01-Home or Self Care   Future Appointments Provider Department Dept Phone   05/15/2013 10:00 AM Dorena Dew, [place] [phone]       Medication List         celecoxib 200 MG capsule  Commonly known as:  CELEBREX  Take 1 capsule (200 mg total) by mouth 2 (two) times daily.     folic acid 1 MG tablet  Commonly known as:  FOLVITE  Take 1 tablet (1 mg total) by mouth every morning.     HYDROmorphone 4 MG tablet  Commonly known as:  DILAUDID  Take 1 tablet (4 mg total) by mouth every 4 (four) hours as needed for severe pain.     hydroxyurea 500 MG capsule  Commonly known as:  HYDREA  Take 1 capsule (500 mg total) by mouth 2 (two) times daily with a meal. May take with food to minimize GI side effects.     morphine 15 MG 12 hr tablet  Commonly known as:  MS CONTIN  Take 1 tablet (15 mg total) by mouth 2 (two) times daily.     potassium chloride SA 20 MEQ tablet  Commonly known as:  K-DUR,KLOR-CON  Take 1 tablet (20 mEq total) by mouth every morning.     Rivaroxaban 20 MG Tabs tablet  Commonly known as:  XARELTO  Take 1 tablet (20 mg total) by mouth every morning.     zolpidem 10 MG tablet  Commonly known as:  AMBIEN  Take 1 tablet (10 mg total) by mouth at bedtime as needed for sleep.         SignedBarbette Merino 03/30/2013, 8:26 AM  Time spent: 36 minutes

## 2013-03-30 NOTE — Progress Notes (Signed)
Patient discharged home, all discharge medications and instructions reviewed and questions answered. Patient to be assisted to vehicle by wheelchair.  

## 2013-04-03 ENCOUNTER — Other Ambulatory Visit: Payer: Self-pay | Admitting: Internal Medicine

## 2013-04-04 NOTE — Discharge Summary (Signed)
Pt seen and examined and discussed with NP Cammie Sickle. Agree with plan.

## 2013-04-04 NOTE — Discharge Summary (Signed)
Pt seen and examined and discussed with NP Cammie Sickle. Agree with plan for discharge home.

## 2013-04-07 ENCOUNTER — Encounter (HOSPITAL_COMMUNITY): Payer: Self-pay | Admitting: Emergency Medicine

## 2013-04-07 ENCOUNTER — Inpatient Hospital Stay (HOSPITAL_COMMUNITY)
Admission: EM | Admit: 2013-04-07 | Discharge: 2013-04-13 | DRG: 812 | Disposition: A | Discharge status: Home / Self Care | Payer: Medicare Other | Attending: Internal Medicine | Admitting: Internal Medicine

## 2013-04-07 ENCOUNTER — Emergency Department (HOSPITAL_COMMUNITY): Payer: Medicare Other

## 2013-04-07 DIAGNOSIS — D473 Essential (hemorrhagic) thrombocythemia: Secondary | ICD-10-CM | POA: Diagnosis not present

## 2013-04-07 DIAGNOSIS — Z87891 Personal history of nicotine dependence: Secondary | ICD-10-CM | POA: Diagnosis not present

## 2013-04-07 DIAGNOSIS — E559 Vitamin D deficiency, unspecified: Secondary | ICD-10-CM

## 2013-04-07 DIAGNOSIS — R079 Chest pain, unspecified: Secondary | ICD-10-CM | POA: Diagnosis not present

## 2013-04-07 DIAGNOSIS — I10 Essential (primary) hypertension: Secondary | ICD-10-CM | POA: Diagnosis present

## 2013-04-07 DIAGNOSIS — D5701 Hb-SS disease with acute chest syndrome: Secondary | ICD-10-CM | POA: Diagnosis present

## 2013-04-07 DIAGNOSIS — Z86711 Personal history of pulmonary embolism: Secondary | ICD-10-CM | POA: Diagnosis present

## 2013-04-07 DIAGNOSIS — M545 Low back pain, unspecified: Secondary | ICD-10-CM | POA: Diagnosis present

## 2013-04-07 DIAGNOSIS — M8700 Idiopathic aseptic necrosis of unspecified bone: Secondary | ICD-10-CM | POA: Diagnosis present

## 2013-04-07 DIAGNOSIS — D72829 Elevated white blood cell count, unspecified: Secondary | ICD-10-CM | POA: Diagnosis not present

## 2013-04-07 DIAGNOSIS — Z79899 Other long term (current) drug therapy: Secondary | ICD-10-CM

## 2013-04-07 DIAGNOSIS — R0781 Pleurodynia: Secondary | ICD-10-CM

## 2013-04-07 DIAGNOSIS — Z832 Family history of diseases of the blood and blood-forming organs and certain disorders involving the immune mechanism: Secondary | ICD-10-CM | POA: Diagnosis not present

## 2013-04-07 DIAGNOSIS — D5700 Hb-SS disease with crisis, unspecified: Principal | ICD-10-CM

## 2013-04-07 DIAGNOSIS — R17 Unspecified jaundice: Secondary | ICD-10-CM | POA: Diagnosis present

## 2013-04-07 DIAGNOSIS — E860 Dehydration: Secondary | ICD-10-CM

## 2013-04-07 DIAGNOSIS — E876 Hypokalemia: Secondary | ICD-10-CM | POA: Diagnosis present

## 2013-04-07 DIAGNOSIS — Z96649 Presence of unspecified artificial hip joint: Secondary | ICD-10-CM | POA: Diagnosis not present

## 2013-04-07 DIAGNOSIS — Z885 Allergy status to narcotic agent status: Secondary | ICD-10-CM

## 2013-04-07 DIAGNOSIS — Z7901 Long term (current) use of anticoagulants: Secondary | ICD-10-CM | POA: Diagnosis not present

## 2013-04-07 DIAGNOSIS — L299 Pruritus, unspecified: Secondary | ICD-10-CM | POA: Diagnosis not present

## 2013-04-07 DIAGNOSIS — F39 Unspecified mood [affective] disorder: Secondary | ICD-10-CM | POA: Diagnosis present

## 2013-04-07 DIAGNOSIS — R0789 Other chest pain: Secondary | ICD-10-CM

## 2013-04-07 DIAGNOSIS — I82C29 Chronic embolism and thrombosis of unspecified internal jugular vein: Secondary | ICD-10-CM | POA: Diagnosis present

## 2013-04-07 DIAGNOSIS — I2782 Chronic pulmonary embolism: Secondary | ICD-10-CM | POA: Diagnosis present

## 2013-04-07 DIAGNOSIS — D571 Sickle-cell disease without crisis: Secondary | ICD-10-CM | POA: Diagnosis present

## 2013-04-07 DIAGNOSIS — R109 Unspecified abdominal pain: Secondary | ICD-10-CM | POA: Diagnosis not present

## 2013-04-07 DIAGNOSIS — D75839 Thrombocytosis, unspecified: Secondary | ICD-10-CM

## 2013-04-07 LAB — URINALYSIS, ROUTINE W REFLEX MICROSCOPIC
Bilirubin Urine: NEGATIVE
GLUCOSE, UA: NEGATIVE mg/dL
Hgb urine dipstick: NEGATIVE
KETONES UR: NEGATIVE mg/dL
LEUKOCYTES UA: NEGATIVE
NITRITE: NEGATIVE
PH: 6.5 (ref 5.0–8.0)
PROTEIN: NEGATIVE mg/dL
Specific Gravity, Urine: 1.013 (ref 1.005–1.030)
Urobilinogen, UA: 1 mg/dL (ref 0.0–1.0)

## 2013-04-07 LAB — COMPREHENSIVE METABOLIC PANEL
ALBUMIN: 4 g/dL (ref 3.5–5.2)
ALT: 21 U/L (ref 0–53)
AST: 30 U/L (ref 0–37)
Alkaline Phosphatase: 90 U/L (ref 39–117)
BILIRUBIN TOTAL: 4.6 mg/dL — AB (ref 0.3–1.2)
BUN: 7 mg/dL (ref 6–23)
CALCIUM: 9 mg/dL (ref 8.4–10.5)
CHLORIDE: 105 meq/L (ref 96–112)
CO2: 22 mEq/L (ref 19–32)
CREATININE: 0.53 mg/dL (ref 0.50–1.35)
GFR calc Af Amer: >90 mL/min (ref >90)
GFR calc non Af Amer: >90 mL/min (ref >90)
Glucose, Bld: 104 mg/dL — ABNORMAL HIGH (ref 70–99)
Potassium: 3.3 mEq/L — ABNORMAL LOW (ref 3.7–5.3)
Sodium: 141 mEq/L (ref 137–147)
Total Protein: 7.8 g/dL (ref 6.0–8.3)

## 2013-04-07 LAB — CBC
HCT: 23.3 % — ABNORMAL LOW (ref 39.0–52.0)
Hemoglobin: 7.9 g/dL — ABNORMAL LOW (ref 13.0–17.0)
MCH: 31.2 pg (ref 26.0–34.0)
MCHC: 33.9 g/dL (ref 30.0–36.0)
MCV: 92.1 fL (ref 78.0–100.0)
Platelets: 671 10*3/uL — ABNORMAL HIGH (ref 150–400)
RBC: 2.53 MIL/uL — ABNORMAL LOW (ref 4.22–5.81)
RDW: 19.9 % — AB (ref 11.5–15.5)
WBC: 21.8 10*3/uL — AB (ref 4.0–10.5)

## 2013-04-07 LAB — RAPID URINE DRUG SCREEN, HOSP PERFORMED
AMPHETAMINES: NOT DETECTED
BARBITURATES: NOT DETECTED
BENZODIAZEPINES: NOT DETECTED
Cocaine: NOT DETECTED
Opiates: POSITIVE — AB
Tetrahydrocannabinol: POSITIVE — AB

## 2013-04-07 LAB — RETICULOCYTES
RBC.: 2.53 MIL/uL — ABNORMAL LOW (ref 4.22–5.81)
RETIC COUNT ABSOLUTE: 334 10*3/uL — AB (ref 19.0–186.0)
RETIC CT PCT: 13.2 % — AB (ref 0.4–3.1)

## 2013-04-07 MED ORDER — RIVAROXABAN 20 MG PO TABS
20.0000 mg | ORAL_TABLET | Freq: Every day | ORAL | Status: DC
  Administered 2013-04-07 – 2013-04-12 (×6): 20 mg via ORAL
  Filled 2013-04-07 (×7): qty 1

## 2013-04-07 MED ORDER — HYDROMORPHONE HCL PF 1 MG/ML IJ SOLN
2.0000 mg | Freq: Once | INTRAMUSCULAR | Status: AC
  Administered 2013-04-07: 2 mg via INTRAVENOUS
  Filled 2013-04-07: qty 2

## 2013-04-07 MED ORDER — HYDROXYUREA 500 MG PO CAPS
500.0000 mg | ORAL_CAPSULE | Freq: Two times a day (BID) | ORAL | Status: DC
Start: 2013-04-07 — End: 2013-04-13
  Administered 2013-04-07 – 2013-04-13 (×12): 500 mg via ORAL
  Filled 2013-04-07 (×15): qty 1

## 2013-04-07 MED ORDER — NALOXONE HCL 0.4 MG/ML IJ SOLN: 0.4000 mg | INTRAMUSCULAR | Status: DC | PRN

## 2013-04-07 MED ORDER — ONDANSETRON HCL 4 MG PO TABS: 4.0000 mg | ORAL_TABLET | ORAL | Status: DC | PRN

## 2013-04-07 MED ORDER — FOLIC ACID 1 MG PO TABS: 1.0000 mg | ORAL_TABLET | Freq: Every day | ORAL | Status: DC

## 2013-04-07 MED ORDER — KCL IN DEXTROSE-NACL 20-5-0.45 MEQ/L-%-% IV SOLN
INTRAVENOUS | Status: DC
  Administered 2013-04-07: 14:00:00 via INTRAVENOUS
  Administered 2013-04-08: 1000 mL via INTRAVENOUS
  Administered 2013-04-09: 05:00:00 via INTRAVENOUS
  Filled 2013-04-07 (×5): qty 1000

## 2013-04-07 MED ORDER — HYDROMORPHONE 0.3 MG/ML IV SOLN
INTRAVENOUS | Status: DC
  Administered 2013-04-07: 7.02 mg via INTRAVENOUS
  Administered 2013-04-07: 15.27 mg via INTRAVENOUS
  Administered 2013-04-07 (×3): via INTRAVENOUS
  Administered 2013-04-08: 7.51 mg via INTRAVENOUS
  Administered 2013-04-08: 7.48 mg via INTRAVENOUS
  Administered 2013-04-08 (×3): via INTRAVENOUS
  Administered 2013-04-08: 13.87 mg via INTRAVENOUS
  Administered 2013-04-08 (×3): via INTRAVENOUS
  Administered 2013-04-08: 14.6 mg via INTRAVENOUS
  Administered 2013-04-08: 8.72 mg via INTRAVENOUS
  Administered 2013-04-09 (×4): via INTRAVENOUS
  Administered 2013-04-09: 2.39 mg via INTRAVENOUS
  Administered 2013-04-09 (×2): via INTRAVENOUS
  Administered 2013-04-09: 12.33 mg via INTRAVENOUS
  Administered 2013-04-09 (×2): via INTRAVENOUS
  Administered 2013-04-09: 25.87 mg via INTRAVENOUS
  Administered 2013-04-10 (×3): via INTRAVENOUS
  Administered 2013-04-10: 6.03 mg via INTRAVENOUS
  Administered 2013-04-10: 19:00:00 via INTRAVENOUS
  Administered 2013-04-10: 6.69 mg via INTRAVENOUS
  Administered 2013-04-10: 9.93 mg via INTRAVENOUS
  Administered 2013-04-10 (×2): via INTRAVENOUS
  Administered 2013-04-10: 13.38 mg via INTRAVENOUS
  Administered 2013-04-10 – 2013-04-11 (×2): via INTRAVENOUS
  Administered 2013-04-11: 8.32 mg via INTRAVENOUS
  Administered 2013-04-11 (×2): via INTRAVENOUS
  Administered 2013-04-11: 22.27 mg via INTRAVENOUS
  Administered 2013-04-11: 19:00:00 via INTRAVENOUS
  Administered 2013-04-11: 5.59 mg via INTRAVENOUS
  Administered 2013-04-11: 17:00:00 via INTRAVENOUS
  Administered 2013-04-11: 24.17 mg via INTRAVENOUS
  Administered 2013-04-11 (×3): via INTRAVENOUS
  Administered 2013-04-12: 11.69 mg via INTRAVENOUS
  Administered 2013-04-12 (×4): via INTRAVENOUS
  Administered 2013-04-12: 7.48 mg via INTRAVENOUS
  Administered 2013-04-12: 13:00:00 via INTRAVENOUS
  Administered 2013-04-12: 13.9 mg via INTRAVENOUS
  Administered 2013-04-12: 11:00:00 via INTRAVENOUS
  Administered 2013-04-12: 9.75 mg via INTRAVENOUS
  Administered 2013-04-12: 18:00:00 via INTRAVENOUS
  Administered 2013-04-13: 7.94 mg via INTRAVENOUS
  Administered 2013-04-13: 12.28 mg via INTRAVENOUS
  Administered 2013-04-13 (×3): via INTRAVENOUS
  Administered 2013-04-13: 6.18 mg via INTRAVENOUS
  Administered 2013-04-13: 0.499 mg via INTRAVENOUS
  Filled 2013-04-07 (×48): qty 25

## 2013-04-07 MED ORDER — PANTOPRAZOLE SODIUM 40 MG PO TBEC
40.0000 mg | DELAYED_RELEASE_TABLET | Freq: Every day | ORAL | Status: DC
  Administered 2013-04-07 – 2013-04-13 (×7): 40 mg via ORAL
  Filled 2013-04-07 (×7): qty 1

## 2013-04-07 MED ORDER — ONDANSETRON HCL 4 MG/2ML IJ SOLN: 4.0000 mg | INTRAMUSCULAR | Status: DC | PRN

## 2013-04-07 MED ORDER — SENNOSIDES-DOCUSATE SODIUM 8.6-50 MG PO TABS
1.0000 | ORAL_TABLET | Freq: Two times a day (BID) | ORAL | Status: DC
  Administered 2013-04-07 – 2013-04-13 (×12): 1 via ORAL
  Filled 2013-04-07 (×17): qty 1

## 2013-04-07 MED ORDER — ONDANSETRON HCL 4 MG/2ML IJ SOLN: 4.0000 mg | Freq: Three times a day (TID) | INTRAMUSCULAR | Status: DC | PRN

## 2013-04-07 MED ORDER — PANTOPRAZOLE SODIUM 40 MG IV SOLR
40.0000 mg | Freq: Every day | INTRAVENOUS | Status: DC
  Filled 2013-04-07 (×3): qty 40

## 2013-04-07 MED ORDER — MORPHINE SULFATE ER 15 MG PO TBCR
15.0000 mg | EXTENDED_RELEASE_TABLET | Freq: Two times a day (BID) | ORAL | Status: DC
  Administered 2013-04-07 – 2013-04-13 (×13): 15 mg via ORAL
  Filled 2013-04-07 (×13): qty 1

## 2013-04-07 MED ORDER — SODIUM CHLORIDE 0.9 % IJ SOLN
INTRAMUSCULAR | Status: AC
  Administered 2013-04-07: 10 mL
  Filled 2013-04-07: qty 10

## 2013-04-07 MED ORDER — DIPHENHYDRAMINE HCL 12.5 MG/5ML PO ELIX: 12.5000 mg | ORAL_SOLUTION | Freq: Four times a day (QID) | ORAL | Status: DC | PRN

## 2013-04-07 MED ORDER — POTASSIUM CHLORIDE CRYS ER 20 MEQ PO TBCR
20.0000 meq | EXTENDED_RELEASE_TABLET | Freq: Every morning | ORAL | Status: DC
  Administered 2013-04-07 – 2013-04-13 (×7): 20 meq via ORAL
  Filled 2013-04-07 (×7): qty 1

## 2013-04-07 MED ORDER — HYDROMORPHONE HCL PF 2 MG/ML IJ SOLN: 2.0000 mg | INTRAMUSCULAR | Status: DC | PRN

## 2013-04-07 MED ORDER — KETOROLAC TROMETHAMINE 30 MG/ML IJ SOLN
30.0000 mg | Freq: Four times a day (QID) | INTRAMUSCULAR | Status: AC
  Administered 2013-04-07 – 2013-04-12 (×20): 30 mg via INTRAVENOUS
  Filled 2013-04-07 (×11): qty 1
  Filled 2013-04-07: qty 2
  Filled 2013-04-07 (×5): qty 1
  Filled 2013-04-07: qty 2
  Filled 2013-04-07 (×6): qty 1

## 2013-04-07 MED ORDER — DIPHENHYDRAMINE HCL 50 MG/ML IJ SOLN
25.0000 mg | Freq: Once | INTRAMUSCULAR | Status: AC
  Administered 2013-04-07: 25 mg via INTRAVENOUS
  Filled 2013-04-07: qty 1

## 2013-04-07 MED ORDER — HYDROMORPHONE HCL PF 1 MG/ML IJ SOLN
2.0000 mg | Freq: Once | INTRAMUSCULAR | Status: AC
  Administered 2013-04-07: 2 mg via INTRAVENOUS
  Filled 2013-04-07 (×2): qty 2

## 2013-04-07 MED ORDER — SODIUM CHLORIDE 0.9 % IV BOLUS (SEPSIS)
500.0000 mL | Freq: Once | INTRAVENOUS | Status: AC
  Administered 2013-04-07: 500 mL via INTRAVENOUS

## 2013-04-07 MED ORDER — DIPHENHYDRAMINE HCL 50 MG/ML IJ SOLN
12.5000 mg | Freq: Four times a day (QID) | INTRAMUSCULAR | Status: DC | PRN
  Administered 2013-04-08 – 2013-04-13 (×16): 12.5 mg via INTRAVENOUS
  Filled 2013-04-07 (×16): qty 1

## 2013-04-07 MED ORDER — SODIUM CHLORIDE 0.9 % IJ SOLN: 9.0000 mL | INTRAMUSCULAR | Status: DC | PRN

## 2013-04-07 MED ORDER — ZOLPIDEM TARTRATE 10 MG PO TABS
10.0000 mg | ORAL_TABLET | Freq: Every evening | ORAL | Status: DC | PRN
Start: 2013-04-07 — End: 2013-04-13
  Administered 2013-04-08 – 2013-04-12 (×5): 10 mg via ORAL
  Filled 2013-04-07 (×5): qty 1

## 2013-04-07 MED ORDER — FOLIC ACID 1 MG PO TABS
1.0000 mg | ORAL_TABLET | Freq: Every morning | ORAL | Status: DC
  Administered 2013-04-07 – 2013-04-13 (×7): 1 mg via ORAL
  Filled 2013-04-07 (×7): qty 1

## 2013-04-07 MED ORDER — KETOROLAC TROMETHAMINE 15 MG/ML IJ SOLN
30.0000 mg | Freq: Once | INTRAMUSCULAR | Status: AC
  Administered 2013-04-07: 30 mg via INTRAVENOUS
  Filled 2013-04-07: qty 2

## 2013-04-07 MED ORDER — POTASSIUM CHLORIDE 20 MEQ/15ML (10%) PO LIQD
40.0000 meq | Freq: Every day | ORAL | Status: AC
  Administered 2013-04-07: 40 meq via ORAL
  Filled 2013-04-07: qty 30

## 2013-04-07 NOTE — ED Notes (Signed)
He tells me that he has had left-sided abd. Pain since Thurs. Plus some vomiting; but denies diarrhea.  He is in no distress.  He vomits while being triaged.

## 2013-04-07 NOTE — ED Notes (Signed)
Left chest Power port accessed. Connections checked, line pulled back, antimicrobial disc used. Good blood return and flushes with out any problems.

## 2013-04-07 NOTE — H&P (Signed)
History and Physical Examination  [name]:[id number] DOB: 05/21/1980 DOA: 04/07/2013  Referring physician: Raliegh Ip. Ashok Cordia, MD PCP: [name]., MD   Chief Complaint: abdominal pain, vomiting  HPI: [name] is a 33 y.o. male reporting to ER with symptoms of left-sided abdominal pain for past 3 days and some vomiting; but denies diarrhea. He vomited in the ER while being triaged. Associated symptoms: no chest pain, no cough, no fever, no headaches, no shortness of breath and no sore throat but has abdominal pain, no chills, no headaches and no sore throat.  He was recently discharged from hospital with an admission for acute chest syndrome.  He reports that he had been doing well until 3 days ago when he developed recurrent epigastric abdominal pain.  He had been taking doxycycline since being discharged but missed yesterday's doses.   He reports that he had been taking his home medications but has not been able to get his pain under adequate control.  He came to the ER and received several doses of IV dilaudid but that was not enough to get his pain under control so that he could be managed at home.  He also reports increasing pruritus and reports poor appetite over the past 24 hours.  He denies diarrhea.    Past Medical History Past Medical History  Diagnosis Date  . Sickle cell anemia   . Blood transfusion   . 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 Hypokalemia   . Mood disorder   . Pulmonary embolism   . Avascular necrosis   . Leukocytosis     Chronic  . Thrombocytosis     Chronic  . Hypertension   . C. difficile colitis   . Uses marijuana   . Chronic anticoagulation   . Functional asplenia    Past Surgical History Past Surgical History  Procedure Laterality Date  . Right hip replacement      08/2006  . Cholecystectomy      01/2008  . Porta cath placement    . Porta cath removal    . Umbilical hernia repair      01/2008  .  Excision of left periauricular cyst      10/2009  . Excision of right ear lobe cyst with primary closur      11/2007  . Portacath placement  01/05/2012    Procedure: INSERTION PORT-A-CATH;  Surgeon: Odis Hollingshead, MD;  Location: [facility];  Service: General;  Laterality: N/A;  ultrasound guiced port a cath insertion with fluoroscopy    Home Meds: Prior to Admission medications   Medication Sig Start Date End Date Taking? Authorizing Provider  celecoxib (CELEBREX) 200 MG capsule Take 1 capsule (200 mg total) by mouth 2 (two) times daily. 03/30/13  Yes Elwyn Reach, MD  doxycycline (VIBRA-TABS) 100 MG tablet Take 100 mg by mouth daily. 03/30/13  Yes Historical Provider, MD  folic acid (FOLVITE) 1 MG tablet Take 1 tablet (1 mg total) by mouth every morning. 06/05/12  Yes [name], MD  HYDROmorphone (DILAUDID) 4 MG tablet Take 1 tablet (4 mg total) by mouth every 4 (four) hours as needed for severe pain. 03/30/13  Yes Elwyn Reach, MD  hydroxyurea (HYDREA) 500 MG capsule Take 1 capsule (500 mg total) by mouth 2 (two) times daily with a meal. May take with food to minimize GI side effects. 03/30/13  Yes Elwyn Reach, MD  morphine (MS CONTIN) 15 MG  12 hr tablet Take 1 tablet (15 mg total) by mouth 2 (two) times daily. 03/30/13  Yes Elwyn Reach, MD  potassium chloride SA (K-DUR,KLOR-CON) 20 MEQ tablet Take 1 tablet (20 mEq total) by mouth every morning. 03/30/13  Yes Elwyn Reach, MD  Rivaroxaban (XARELTO) 20 MG TABS tablet Take 20 mg by mouth daily with supper.   Yes Historical Provider, MD  zolpidem (AMBIEN) 10 MG tablet Take 1 tablet (10 mg total) by mouth at bedtime as needed for sleep. 03/30/13  Yes Elwyn Reach, MD   Allergies: Morphine and related  Social History:  History   Social History  . Marital Status: Single    Spouse Name: N/A    Number of Children: 0  . Years of Education: 13   Occupational History  . 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 Smoking status: Former Smoker -- 13 years    Types: Cigarettes    Quit date: 07/08/2010  . Smokeless tobacco: Never Used  . Alcohol Use: 6.0 oz/week    10 Shots of liquor per week  . Drug Use: Yes    Special: Marijuana     Comment: quit smoking 2011  . Sexual Activity: Yes    Partners: Female    Patent examiner Protection: None   Other Topics Concern  . Not on file   Social History Narrative   Lives in an apartment.  Single.  Lives alone but has a girlfriend that helps care for him.  Does not use any assist devices.        [name]  [phone] Mom, emergency contact   Family History:  Family History  Problem Relation Age of Onset  . Sickle cell anemia Mother   . Sickle cell anemia Father   . Sickle cell trait Brother    Review of Systems:  Constitutional: Negative for fever and chills.  HENT: Negative for sore throat.  Eyes: Negative for redness.  Respiratory: Negative for cough and shortness of breath.  Cardiovascular: Negative for chest pain.  Gastrointestinal: Positive for nausea, vomiting and abdominal pain.  Genitourinary: Negative for dysuria.  Musculoskeletal: Negative for back pain and neck pain.  Skin: Negative for rash.  Neurological: Negative for headaches.  Hematological: Does not bruise/bleed easily.  Psychiatric/Behavioral: Negative for confusion. All other systems reviewed and reported as negative.   Physical Exam: Blood pressure 125/78, pulse 80, temperature 98.8 F (37.1 C), temperature source Oral, resp. rate 16, SpO2 97.00%. Nursing note and vitals reviewed.  Constitutional: He is oriented to person, place, and time. He appears well-developed and well-nourished. No distress.  HENT: Nose: Nose normal.  Mouth/Throat: Oropharynx is clear and moist.  Eyes: Conjunctivae are normal. Bilateral scleral icterus.  Neck: Neck supple. No tracheal deviation present.  Cardiovascular: Normal rate,  regular rhythm, normal heart sounds and intact distal pulses.  Pulmonary/Chest: Effort normal and breath sounds normal. No accessory muscle usage. No respiratory distress. He exhibits no tenderness.  Port left chest without sign of infection.  Abdominal: Soft. Bowel sounds are normal. Mild epigastric tenderness to palpation. He exhibits no distension and no mass. There is no rebound and no guarding.  Genitourinary: No cva tenderness  Musculoskeletal: Normal range of motion. He exhibits no edema and no tenderness.  Neurological: He is alert and oriented to person, place, and time.  Skin: Skin is warm and dry. He is not diaphoretic.  Psychiatric: He  has a normal mood and affect.   Lab  And Imaging results:  Results for orders placed during the hospital encounter of 04/07/13 (from the past 24 hour(s))  CBC     Status: Abnormal   Collection Time    04/07/13  9:24 AM      Result Value Ref Range   WBC 21.8 (*) 4.0 - 10.5 K/uL   RBC 2.53 (*) 4.22 - 5.81 MIL/uL   Hemoglobin 7.9 (*) 13.0 - 17.0 g/dL   HCT 23.3 (*) 39.0 - 52.0 %   MCV 92.1  78.0 - 100.0 fL   MCH 31.2  26.0 - 34.0 pg   MCHC 33.9  30.0 - 36.0 g/dL   RDW 19.9 (*) 11.5 - 15.5 %   Platelets 671 (*) 150 - 400 K/uL  COMPREHENSIVE METABOLIC PANEL     Status: Abnormal   Collection Time    04/07/13  9:24 AM      Result Value Ref Range   Sodium 141  137 - 147 mEq/L   Potassium 3.3 (*) 3.7 - 5.3 mEq/L   Chloride 105  96 - 112 mEq/L   CO2 22  19 - 32 mEq/L   Glucose, Bld 104 (*) 70 - 99 mg/dL   BUN 7  6 - 23 mg/dL   Creatinine, Ser 0.53  0.50 - 1.35 mg/dL   Calcium 9.0  8.4 - 10.5 mg/dL   Total Protein 7.8  6.0 - 8.3 g/dL   Albumin 4.0  3.5 - 5.2 g/dL   AST 30  0 - 37 U/L   ALT 21  0 - 53 U/L   Alkaline Phosphatase 90  39 - 117 U/L   Total Bilirubin 4.6 (*) 0.3 - 1.2 mg/dL   GFR calc non Af Amer >90  >90 mL/min   GFR calc Af Amer >90  >90 mL/min  RETICULOCYTES     Status: Abnormal   Collection Time    04/07/13  9:24 AM       Result Value Ref Range   Retic Ct Pct 13.2 (*) 0.4 - 3.1 %   RBC. 2.53 (*) 4.22 - 5.81 MIL/uL   Retic Count, Manual 334.0 (*) 19.0 - 186.0 K/uL   Impression / Plan   Chronic anticoagulation   Avascular necrosis   Mood disorder   Hypertension   Sickle cell hemolytic anemia   Hx of pulmonary embolus   Leukocytosis, unspecified   Low back pain   Sickle cell pain crisis   Acute HbSS vaso-occlusive pain Crisis - Admit to med-surg, start acute ss crisis protocol, IVFs, IV ketorolac to treat inflammation, start dilaudid PCA, resume maintenance long-acting home pain meds, follow PCA protocols for safety, Individualized PCA dosing will be ordered as patient has had frequent recent admissions.  His chest xray is only showing chronic changes, No acute findings and no clinical signs/symptoms to suggest acute chest syndrome.   Chronic anticoagulation for history of PE - continue home Xarelto daily  Chronic Leukocytosis - likely reactive to inflammation related to vaso-occlusion, no signs of infection at this time but will follow clinically.   Mood Disorder - Pt not taking medications for this at this time but will follow clinically, currently seems stable  Nausea and Vomiting - will provide IV medications for nausea and emesis  Hypokalemia - will order replacement in IVFs.   Hyperbilirubinemia - this has been chronic, in fact his level today is lower than it has been in last few months.  Will follow  Abdominal pain - this pain is in the same location that patient reports that he has his sickle cell crises.  I am starting him on protonix as he does take celebrex.  Also, will monitor clinically for improvement.  Pt reporting that he would like to try a regular diet. Will try this and follow.  If he can't tolerate this then will likely make NPO or clear liquids only.   [place], [place] 04/07/2013, 12:30 PM

## 2013-04-07 NOTE — ED Provider Notes (Signed)
CSN: [id number]     Arrival date & time 04/07/13  [id number] History   First MD Initiated Contact with Patient 04/07/13 0830     Chief Complaint  Patient presents with  . Sickle Cell Pain Crisis  . Emesis  . Abdominal Pain     (Consider location/radiation/quality/duration/timing/severity/associated sxs/prior Treatment) Patient is a 33 y.o. male presenting with sickle cell pain, vomiting, and abdominal pain. The history is provided by the patient.  Sickle Cell Pain Crisis Associated symptoms: nausea and vomiting   Associated symptoms: no chest pain, no cough, no fever, no headaches, no shortness of breath and no sore throat   Emesis Associated symptoms: abdominal pain   Associated symptoms: no chills, no headaches and no sore throat   Abdominal Pain Associated symptoms: nausea and vomiting   Associated symptoms: no chest pain, no chills, no cough, no dysuria, no fever, no shortness of breath and no sore throat   pt w hx sickle cell disease, c/o sickle cell pain.  He states pain left flank/upper abd and legs which he states is his normal location for sickle cell pain. States is taking home meds without relief. No fever or chills. No cough or uri c/o. No chest pain or shortness of breath. Pain constant, dull, mod-severe, non radiating,  without specific exacerbating or alleviating factors. + single episode nv, emesis not bloody or bilious.       Past Medical History  Diagnosis Date  . Sickle cell anemia   . Blood transfusion   . 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 Hypokalemia   . Mood disorder   . Pulmonary embolism   . Avascular necrosis   . Leukocytosis     Chronic  . Thrombocytosis     Chronic  . Hypertension   . C. difficile colitis   . Uses marijuana   . Chronic anticoagulation   . Functional asplenia    Past Surgical History  Procedure Laterality Date  . Right hip replacement      08/2006  . Cholecystectomy      01/2008  . Porta cath  placement    . Porta cath removal    . Umbilical hernia repair      01/2008  . Excision of left periauricular cyst      10/2009  . Excision of right ear lobe cyst with primary closur      11/2007  . Portacath placement  01/05/2012    Procedure: INSERTION PORT-A-CATH;  Surgeon: Odis Hollingshead, MD;  Location: [facility];  Service: General;  Laterality: N/A;  ultrasound guiced port a cath insertion with fluoroscopy   Family History  Problem Relation Age of Onset  . Sickle cell anemia Mother   . Sickle cell anemia Father   . Sickle cell trait Brother    History  Substance Use Topics  . Smoking status: Former Smoker -- 13 years    Types: Cigarettes    Quit date: 07/08/2010  . Smokeless tobacco: Never Used  . Alcohol Use: 6.0 oz/week    10 Shots of liquor per week    Review of Systems  Constitutional: Negative for fever and chills.  HENT: Negative for sore throat.   Eyes: Negative for redness.  Respiratory: Negative for cough and shortness of breath.   Cardiovascular: Negative for chest pain.  Gastrointestinal: Positive for nausea, vomiting and abdominal pain.  Genitourinary: Negative for dysuria.  Musculoskeletal: Negative for back pain and neck pain.  Skin: Negative for  rash.  Neurological: Negative for headaches.  Hematological: Does not bruise/bleed easily.  Psychiatric/Behavioral: Negative for confusion.      Allergies  Morphine and related  Home Medications   Current Outpatient Rx  Name  Route  Sig  Dispense  Refill  . celecoxib (CELEBREX) 200 MG capsule   Oral   Take 1 capsule (200 mg total) by mouth 2 (two) times daily.   60 capsule   1   . doxycycline (VIBRAMYCIN) 50 MG capsule   Oral   Take 2 capsules (100 mg total) by mouth 2 (two) times daily.   14 capsule   0   . folic acid (FOLVITE) 1 MG tablet   Oral   Take 1 tablet (1 mg total) by mouth every morning.   30 tablet   11   . HYDROmorphone (DILAUDID) 4 MG tablet   Oral   Take 1 tablet (4 mg  total) by mouth every 4 (four) hours as needed for severe pain.   90 tablet   0   . hydroxyurea (HYDREA) 500 MG capsule   Oral   Take 1 capsule (500 mg total) by mouth 2 (two) times daily with a meal. May take with food to minimize GI side effects.   60 capsule   0   . morphine (MS CONTIN) 15 MG 12 hr tablet   Oral   Take 1 tablet (15 mg total) by mouth 2 (two) times daily.   60 tablet   0   . potassium chloride SA (K-DUR,KLOR-CON) 20 MEQ tablet   Oral   Take 1 tablet (20 mEq total) by mouth every morning.   30 tablet   3   . XARELTO 20 MG TABS tablet      TAKE 1 TABLET BY MOUTH DAILY EACH MORNING   30 tablet   0   . zolpidem (AMBIEN) 10 MG tablet   Oral   Take 1 tablet (10 mg total) by mouth at bedtime as needed for sleep.   30 tablet   2    BP 128/74  Pulse 90  Temp(Src) 98.8 F (37.1 C) (Oral)  Resp 26  SpO2 95% Physical Exam  Nursing note and vitals reviewed. Constitutional: He is oriented to person, place, and time. He appears well-developed and well-nourished. No distress.  HENT:  Nose: Nose normal.  Mouth/Throat: Oropharynx is clear and moist.  Eyes: Conjunctivae are normal.  Neck: Neck supple. No tracheal deviation present.  Cardiovascular: Normal rate, regular rhythm, normal heart sounds and intact distal pulses.   Pulmonary/Chest: Effort normal and breath sounds normal. No accessory muscle usage. No respiratory distress. He exhibits no tenderness.  Port left chest without sign of infection.   Abdominal: Soft. Bowel sounds are normal. He exhibits no distension and no mass. There is no tenderness. There is no rebound and no guarding.  Genitourinary:  No cva tenderness  Musculoskeletal: Normal range of motion. He exhibits no edema and no tenderness.  Neurological: He is alert and oriented to person, place, and time.  Skin: Skin is warm and dry. He is not diaphoretic.  Psychiatric: He has a normal mood and affect.    ED Course  Procedures (including  critical care time)  Results for orders placed during the hospital encounter of 04/07/13  CBC      Result Value Ref Range   WBC 21.8 (*) 4.0 - 10.5 K/uL   RBC 2.53 (*) 4.22 - 5.81 MIL/uL   Hemoglobin 7.9 (*) 13.0 - 17.0 g/dL  HCT 23.3 (*) 39.0 - 52.0 %   MCV 92.1  78.0 - 100.0 fL   MCH 31.2  26.0 - 34.0 pg   MCHC 33.9  30.0 - 36.0 g/dL   RDW 19.9 (*) 11.5 - 15.5 %   Platelets 671 (*) 150 - 400 K/uL  COMPREHENSIVE METABOLIC PANEL      Result Value Ref Range   Sodium 141  137 - 147 mEq/L   Potassium 3.3 (*) 3.7 - 5.3 mEq/L   Chloride 105  96 - 112 mEq/L   CO2 22  19 - 32 mEq/L   Glucose, Bld 104 (*) 70 - 99 mg/dL   BUN 7  6 - 23 mg/dL   Creatinine, Ser 0.53  0.50 - 1.35 mg/dL   Calcium 9.0  8.4 - 10.5 mg/dL   Total Protein 7.8  6.0 - 8.3 g/dL   Albumin 4.0  3.5 - 5.2 g/dL   AST 30  0 - 37 U/L   ALT 21  0 - 53 U/L   Alkaline Phosphatase 90  39 - 117 U/L   Total Bilirubin 4.6 (*) 0.3 - 1.2 mg/dL   GFR calc non Af Amer >90  >90 mL/min   GFR calc Af Amer >90  >90 mL/min  RETICULOCYTES      Result Value Ref Range   Retic Ct Pct 13.2 (*) 0.4 - 3.1 %   RBC. 2.53 (*) 4.22 - 5.81 MIL/uL   Retic Count, Manual 334.0 (*) 19.0 - 186.0 K/uL   Dg Chest 2 View  04/07/2013   CLINICAL DATA:  Sickle cell crisis and cough.  EXAM: CHEST  2 VIEW  COMPARISON:  DG CHEST 1V PORT dated 03/28/2013  FINDINGS: Prominent interstitial lung markings suggest chronic changes. No focal airspace disease or overt pulmonary edema. There is a left subclavian Port-A-Cath. Catheter tip in the lower SVC. Heart size is normal and negative for pleural effusions.  IMPRESSION: Chronic lung changes without acute findings.   Electronically Signed   By: Markus Daft M.D.   On: 04/07/2013 10:55   Dg Chest 2 View  03/22/2013   CLINICAL DATA:  Former smoking history, leukocytosis, history of sickle cell disease  EXAM: CHEST  2 VIEW  COMPARISON:  Portable chest x-ray of 02/14/2012  FINDINGS: No active infiltrate or effusion is  seen. Indistinct perihilar vasculature may indicate very minimal pulmonary vascular congestion. The heart is mildly enlarged and stable. A left-sided Port-A-Cath tip remains at the expected SVC-RA junction.  IMPRESSION: Stable mild cardiomegaly with minimal pulmonary vascular congestion.   Electronically Signed   By: Ivar Drape M.D.   On: 03/22/2013 11:20   Ct Angio Chest Pe W/cm &/or Wo Cm  03/26/2013   CLINICAL DATA:  Hypoxemia.  Known history of pulmonary embolism.  EXAM: CT ANGIOGRAPHY CHEST WITH CONTRAST  TECHNIQUE: Multidetector CT imaging of the chest was performed using the standard protocol during bolus administration of intravenous contrast. Multiplanar CT image reconstructions and MIPs were obtained to evaluate the vascular anatomy.  CONTRAST:  128mL OMNIPAQUE IOHEXOL 350 MG/ML SOLN  COMPARISON:  DG CHEST 2 VIEW dated 03/22/2013; CT CHEST W/CM dated 11/17/2012; CT ANGIO CHEST W/CM &/OR WO/CM dated 11/13/2012  FINDINGS: Technically adequate study without pulmonary embolus. Aorta and branch vessels are within normal limits. There is no axillary adenopathy. Left subclavian Port-A-Cath is unchanged with the tip in the right atrium. Cardiomegaly is present with right heart enlargement. There is also enlargement of the left and right pulmonary arteries suggesting  pulmonary arterial hypertension. Bilateral gynecomastia is incidentally noted.  There are scattered foci of peripheral ground-glass attenuation in both lungs, a finding which can be associated with acute chest syndrome and small vessel infarction with hemorrhagic edema. Bronchopneumonia and small vessel disease are in the differential considerations. Incidental imaging of the upper abdomen is grossly within normal limits. Mediastinal adenopathy appears similar to prior exam. Unchanged right hilar adenopathy. Notably, no pleural effusions. Sickle cell endplate infarcts are present in the thoracic spine with multiple Schmorl's nodes.  Review of the  MIP images confirms the above findings.  IMPRESSION: 1. Negative for pulmonary embolus. 2. Scattered areas of ground-glass attenuation bilaterally in the lungs suggesting acute chest syndrome in this patient with sickle cell anemia. 3. Unchanged cardiomegaly it and mediastinal and hilar adenopathy. 4. Enlargement of the left and right pulmonary arteries and right heart enlargement suggesting pulmonary arterial hypertension.   Electronically Signed   By: Dereck Ligas M.D.   On: 03/26/2013 16:46   Dg Chest Port 1 View  03/28/2013   CLINICAL DATA:  Hypoxia  EXAM: PORTABLE CHEST - 1 VIEW  COMPARISON:  CT ANGIO CHEST W/CM &/OR WO/CM dated 03/26/2013; DG CHEST 2 VIEW dated 03/22/2013  FINDINGS: Stable left subclavian Port-A-Cath. Cardiomegaly. Clear lungs. No pneumothorax.  IMPRESSION: No active disease.   Electronically Signed   By: Maryclare Bean M.D.   On: 03/28/2013 07:43      MDM  Iv ns 500 cc. o2 Metaline. Labs. Dilaudid 2 mg iv.  Reviewed nursing notes and prior charts for additional history.   Recheck, pt appears comfortable, states pain persists.   Dilaudid 2 mg iv.  Recheck requests benadryl for itching. No rash/hives seen.   Benadryl iv.  Recheck noted w episodic cough, non prod, pts pain includes left costal margin - will add cxr.   Recheck pain persists. Dilaudid 2 mg iv. toradol iv.  pts notes pain persists/not controlled post dilaudid iv - will contact med service for admission.        Mirna Mires, MD 04/07/13 1102

## 2013-04-07 NOTE — ED Notes (Signed)
Will transport pt when room is clean.

## 2013-04-07 NOTE — Progress Notes (Signed)
[facility] PROGRESS NOTE  FELTON BUCZYNSKI BXU:[id number] DOB: March 11, 1980 DOA: 04/07/2013 PCP: [name],[name], MD  Patient is being admitted with sickle cell; Called Dr. Wynetta Emery who kindly agreed to admit the patient   Kinnie Feil  [facility] Pager [phone]. If 7PM-7AM, please contact night-coverage at [email or web address], password [facility] 04/07/2013, 12:18 PM  LOS: 0 days

## 2013-04-08 DIAGNOSIS — E860 Dehydration: Secondary | ICD-10-CM | POA: Diagnosis not present

## 2013-04-08 DIAGNOSIS — Z86711 Personal history of pulmonary embolism: Secondary | ICD-10-CM | POA: Diagnosis not present

## 2013-04-08 DIAGNOSIS — M8700 Idiopathic aseptic necrosis of unspecified bone: Secondary | ICD-10-CM | POA: Diagnosis not present

## 2013-04-08 DIAGNOSIS — D473 Essential (hemorrhagic) thrombocythemia: Secondary | ICD-10-CM

## 2013-04-08 DIAGNOSIS — Z7901 Long term (current) use of anticoagulants: Secondary | ICD-10-CM | POA: Diagnosis not present

## 2013-04-08 DIAGNOSIS — E876 Hypokalemia: Secondary | ICD-10-CM | POA: Diagnosis not present

## 2013-04-08 DIAGNOSIS — I2782 Chronic pulmonary embolism: Secondary | ICD-10-CM | POA: Diagnosis not present

## 2013-04-08 DIAGNOSIS — D5700 Hb-SS disease with crisis, unspecified: Secondary | ICD-10-CM | POA: Diagnosis not present

## 2013-04-08 DIAGNOSIS — R17 Unspecified jaundice: Secondary | ICD-10-CM | POA: Diagnosis not present

## 2013-04-08 DIAGNOSIS — I82C29 Chronic embolism and thrombosis of unspecified internal jugular vein: Secondary | ICD-10-CM | POA: Diagnosis not present

## 2013-04-08 LAB — CBC WITH DIFFERENTIAL/PLATELET
BASOS PCT: 1 % (ref 0–1)
Basophils Absolute: 0.3 10*3/uL — ABNORMAL HIGH (ref 0.0–0.1)
EOS ABS: 0.8 10*3/uL — AB (ref 0.0–0.7)
EOS PCT: 3 % (ref 0–5)
HEMATOCRIT: 21.6 % — AB (ref 39.0–52.0)
HEMOGLOBIN: 7.3 g/dL — AB (ref 13.0–17.0)
LYMPHS ABS: 4.5 10*3/uL — AB (ref 0.7–4.0)
Lymphocytes Relative: 18 % (ref 12–46)
MCH: 31.3 pg (ref 26.0–34.0)
MCHC: 33.8 g/dL (ref 30.0–36.0)
MCV: 92.7 fL (ref 78.0–100.0)
MONO ABS: 3 10*3/uL — AB (ref 0.1–1.0)
Monocytes Relative: 12 % (ref 3–12)
NEUTROS ABS: 16.5 10*3/uL — AB (ref 1.7–7.7)
Neutrophils Relative %: 66 % (ref 43–77)
Platelets: 599 10*3/uL — ABNORMAL HIGH (ref 150–400)
RBC: 2.33 MIL/uL — AB (ref 4.22–5.81)
RDW: 19.6 % — ABNORMAL HIGH (ref 11.5–15.5)
WBC: 25.1 10*3/uL — ABNORMAL HIGH (ref 4.0–10.5)

## 2013-04-08 LAB — COMPREHENSIVE METABOLIC PANEL
ALBUMIN: 3.6 g/dL (ref 3.5–5.2)
ALK PHOS: 84 U/L (ref 39–117)
ALT: 14 U/L (ref 0–53)
AST: 22 U/L (ref 0–37)
BUN: 5 mg/dL — ABNORMAL LOW (ref 6–23)
CALCIUM: 8.6 mg/dL (ref 8.4–10.5)
CO2: 20 mEq/L (ref 19–32)
Chloride: 105 mEq/L (ref 96–112)
Creatinine, Ser: 0.53 mg/dL (ref 0.50–1.35)
GFR calc Af Amer: >90 mL/min (ref >90)
GFR calc non Af Amer: >90 mL/min (ref >90)
Glucose, Bld: 116 mg/dL — ABNORMAL HIGH (ref 70–99)
Potassium: 3.9 mEq/L (ref 3.7–5.3)
Sodium: 139 mEq/L (ref 137–147)
TOTAL PROTEIN: 7.1 g/dL (ref 6.0–8.3)
Total Bilirubin: 3.6 mg/dL — ABNORMAL HIGH (ref 0.3–1.2)

## 2013-04-08 LAB — RETICULOCYTES
RBC.: 2.33 MIL/uL — ABNORMAL LOW (ref 4.22–5.81)
Retic Count, Absolute: 361.2 10*3/uL — ABNORMAL HIGH (ref 19.0–186.0)
Retic Ct Pct: 15.5 % — ABNORMAL HIGH (ref 0.4–3.1)

## 2013-04-08 LAB — VITAMIN D 25 HYDROXY (VIT D DEFICIENCY, FRACTURES): VIT D 25 HYDROXY: 10 ng/mL — AB (ref 30–89)

## 2013-04-08 LAB — MAGNESIUM: Magnesium: 1.6 mg/dL (ref 1.5–2.5)

## 2013-04-08 IMAGING — CR DG CHEST 2V
2 series · 2 of 2 positions shown · non-contrast
Comparison: PA and lateral chest 04/13/2010.

CLINICAL DATA: Chest pain in patient with sickle cell disease.

CHEST - 2 VIEW

[w chest pa]
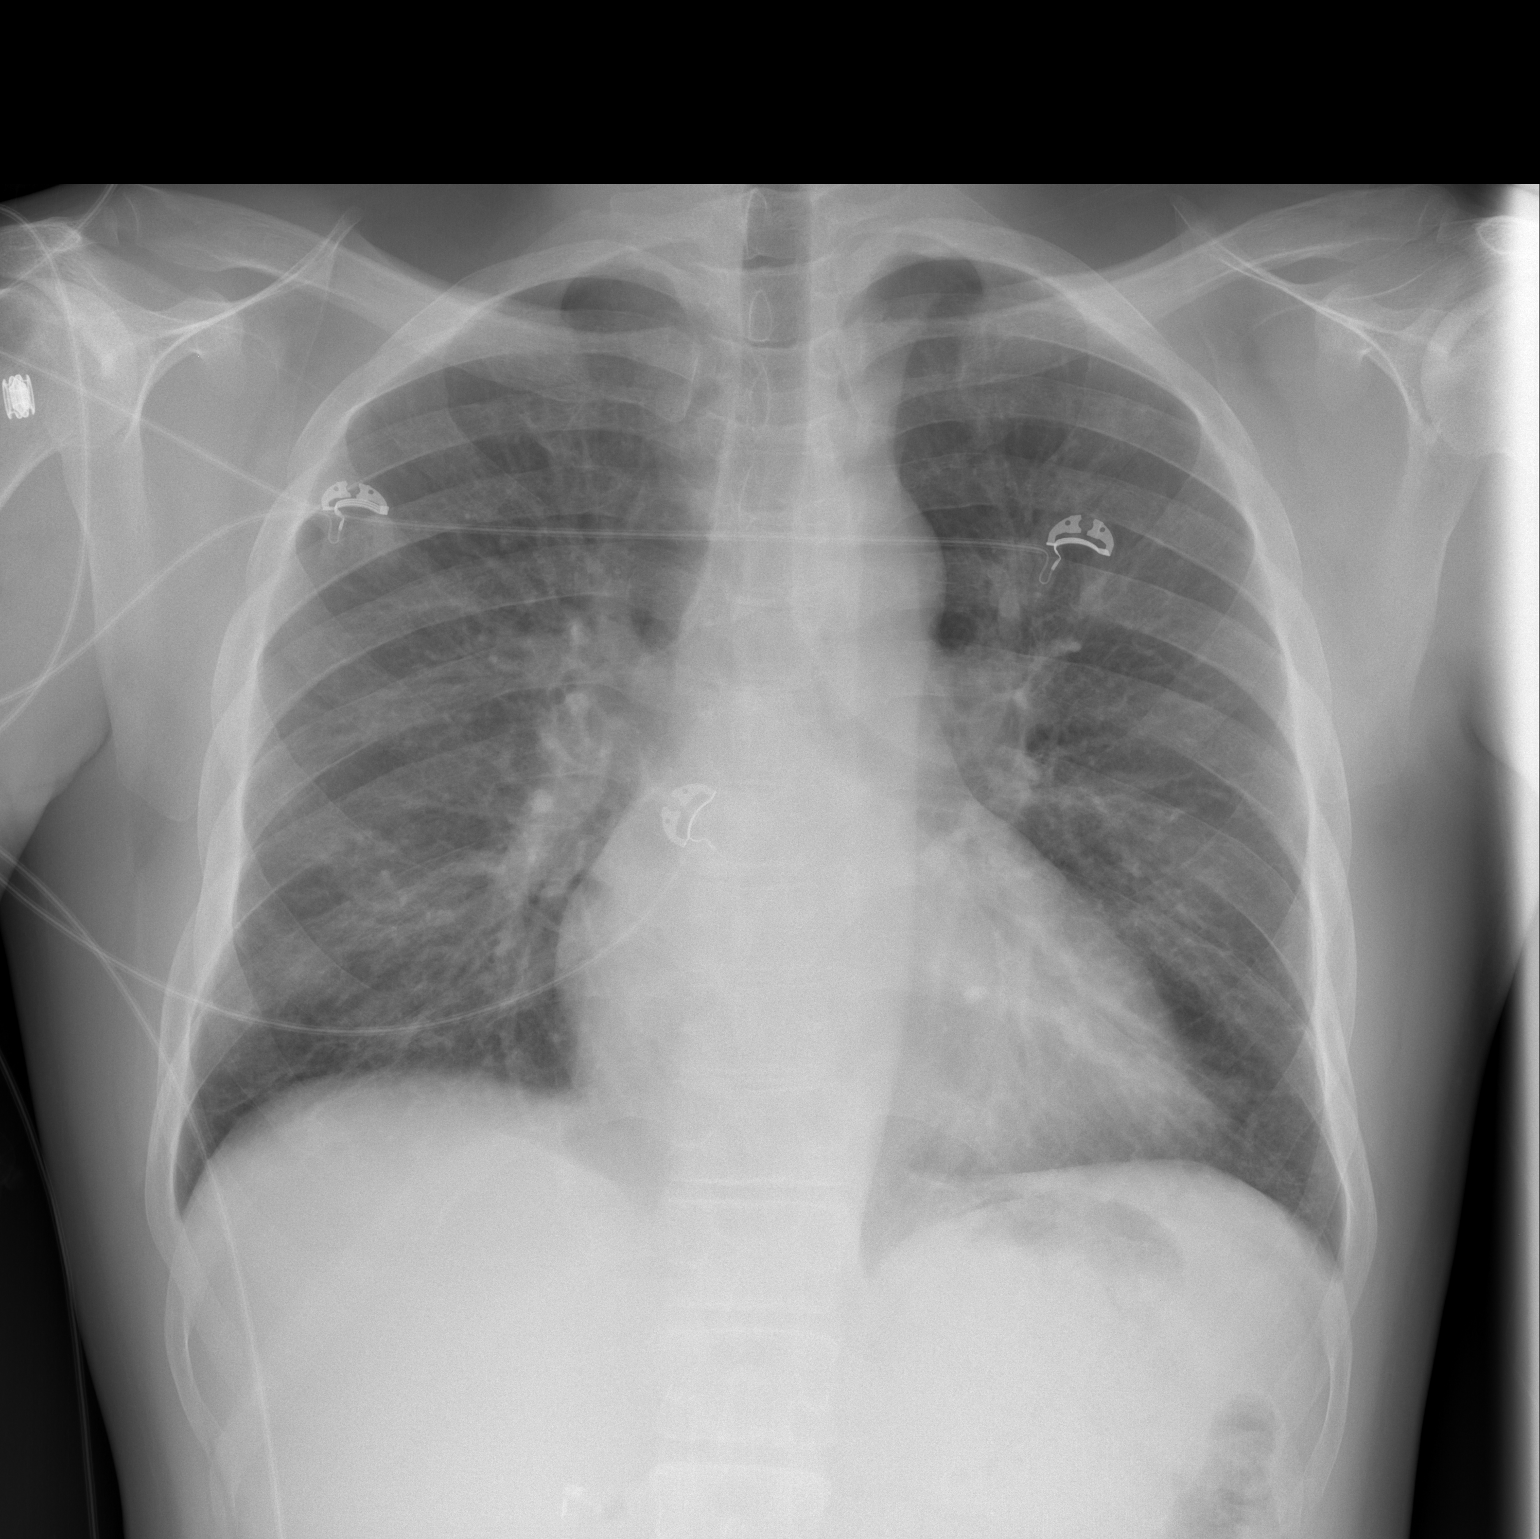

[w chest lat]
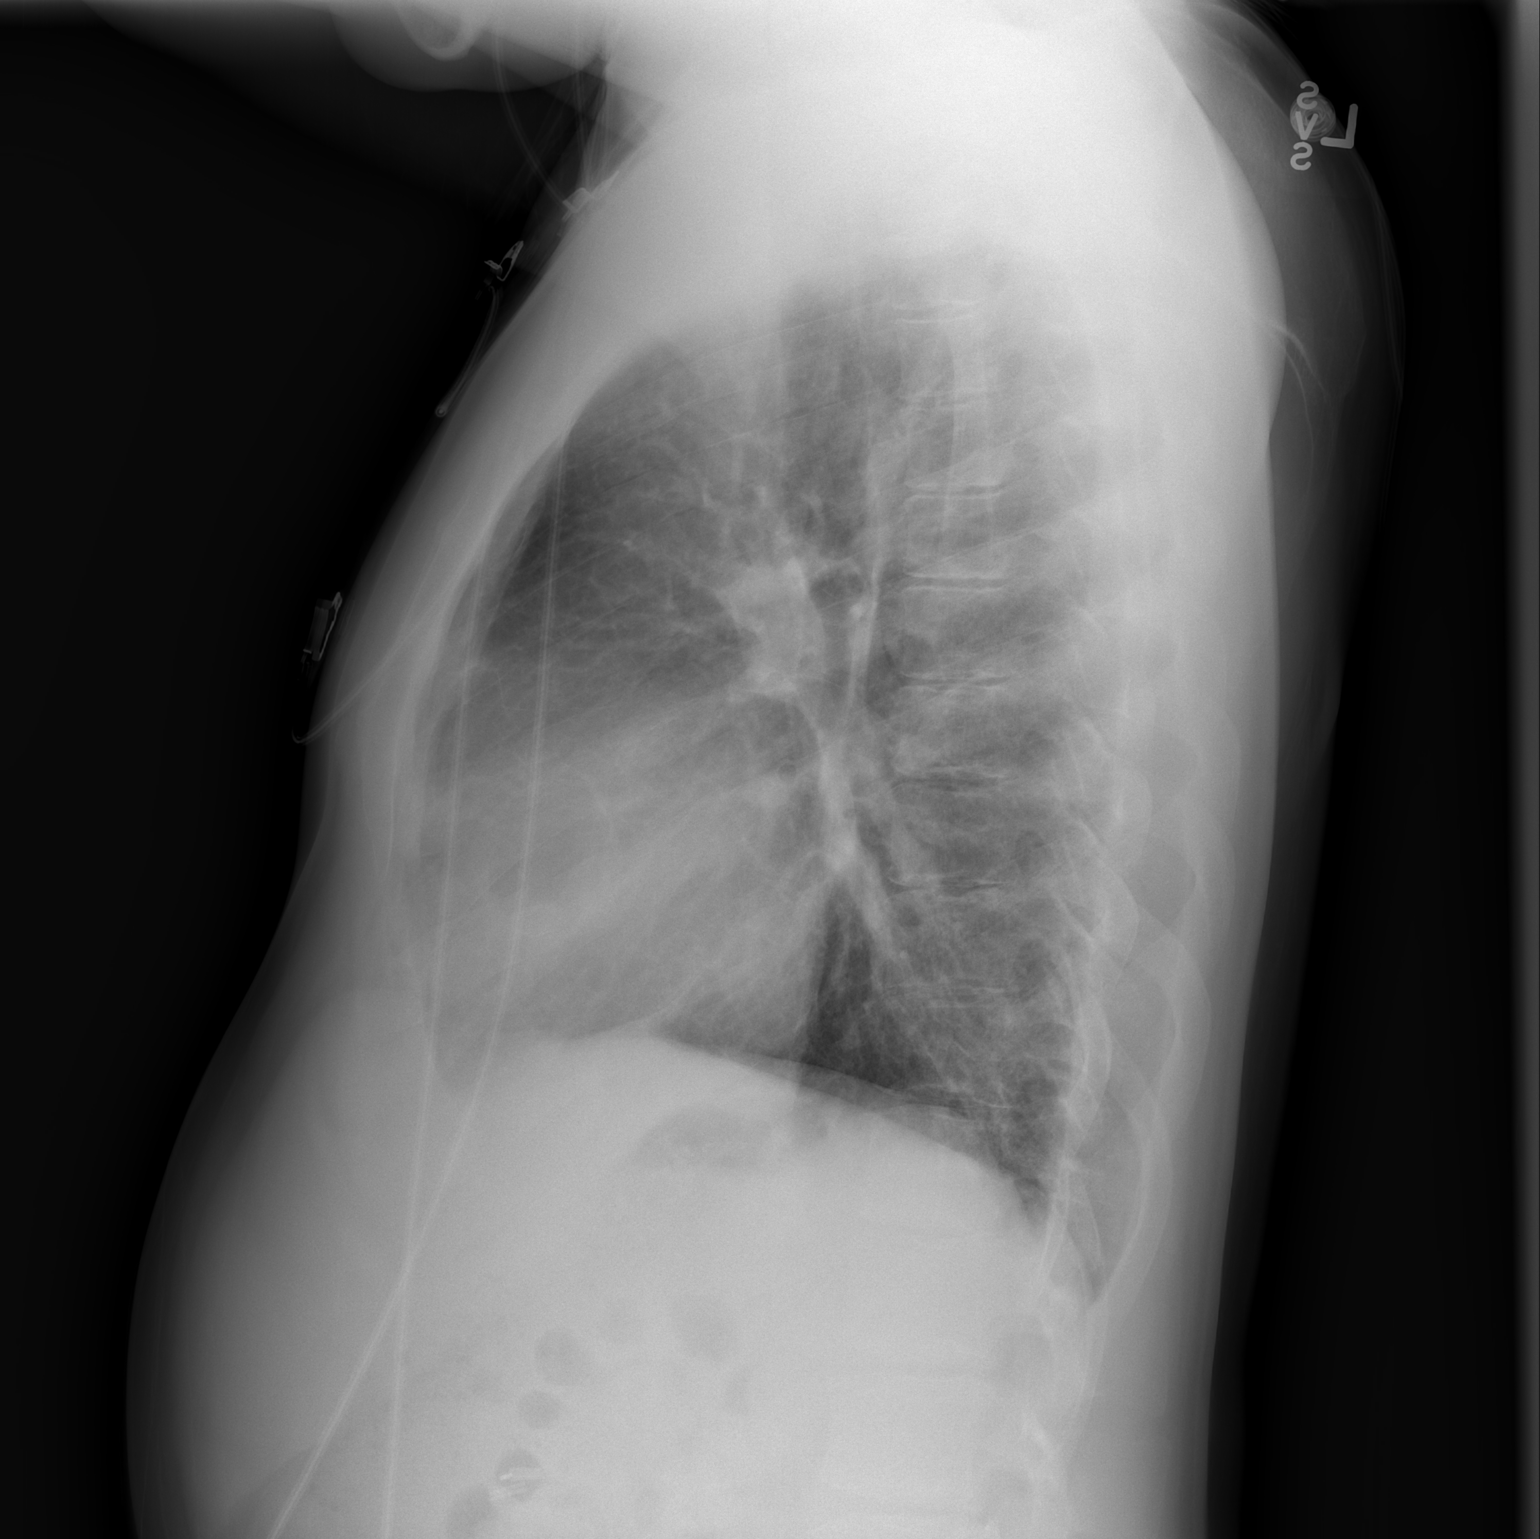

[2 of 2 positions shown; findings below may reference images not displayed]

FINDINGS: Lungs are clear.  Heart size mildly enlarged.  No
pneumothorax or pleural effusion.
IMPRESSION: No acute finding.

## 2013-04-08 NOTE — Progress Notes (Addendum)
PHYSICIAN PROGRESS NOTE  WYLAND RASTETTER MWN:[id number] DOB: [birth year] DOA: 04/07/2013  04/08/2013   PCP: [name],[name], MD  Admission History: [name] is a 33 y.o. male reporting to ER with symptoms of left-sided abdominal pain for 3 days PTA and some vomiting; but denied diarrhea. He vomited in the ER while being triaged. He reported no chest pain, no cough, no fever, no headaches, no shortness of breath and no sore throat but has abdominal pain, no chills, no headaches and no sore throat. He was recently discharged from hospital with an admission for acute chest syndrome. He reports that he had been doing well until 3 days ago when he developed recurrent epigastric abdominal pain. He had been taking doxycycline since being discharged but missed some doses. He reported that he had been taking his home medications but has not been able to get his pain under adequate control. He came to the ER and received several doses of IV dilaudid but that was not enough to get his pain under control so that he could be managed at home. He also reports increasing pruritus and reports poor appetite over the past 24 hours. He denied diarrhea.   Assessment/Plan: 1. Acute HbSS vaso-occlusive pain crisis - Pt is on dilaudid PCA and reporting that his pain is better controlled.  He has used 44.9 mg with 60 demands, 59 deliveries since admission. I have continued his home long-acting MS Contin 15 mg BID, He has not maxed out his PCA use.  He had been requesting clinician assisted dosing but he really hasn't maxed out his PCA use and clinically he is looking comfortable, not like he was for his last admission when I saw him.  At this time, I am going to continue current therapy.  Will monitor him clinically.  Continuing supportive therapy.  I have encouraged incentive spirometry and ambulation.  Continue ketorolac IV for inflammation.  I am going to repeat a chest xray in the morning.  2. Chronic  anticoagulation for history of acute embolism and thrombosis of right internal jugular - Continuing Xarelto daily.  3. Nausea and vomiting - Pt has tolerated his diet.  Nausea medications have been ordered to use as needed 4. Hypokalemia - repleted orally and in IVFs.  Mg level 1.6.  5. Hyperbilirubinemia - this has been chronically elevated but lower today than it has been in last couple of months.  6. Abdominal Pain - Pt reports that he had a BM yesterday.  I encouraged him to take the stool softeners.  7. Anemia - no indication for transfusion at this time, will continue to follow closely.    8. Thrombocytosis - chronic, following, slight improvement today 9. Leukocytosis - chronic, likely related to acute vaso-occlusion and inflammation - no clinical signs of infection, no evidence of infection found, will follow clinically.   Code Status: FULL  HPI/Subjective: Pt reporting that he is having pain in the rib cage.  No shortness of breath.    Objective: Filed Vitals:   04/08/13 0823  BP:   Pulse:   Temp:   Resp: 21    Intake/Output Summary (Last 24 hours) at 04/08/13 0919 Last data filed at 04/08/13 4034  Gross per 24 hour  Intake 1888.33 ml  Output   1250 ml  Net 638.33 ml   Filed Weights   04/07/13 1255  Weight: 159 lb 9.8 oz (72.4 kg)   Review of Systems:  As reported in HPI, otherwise all reviewed and reported negative  Exam:  Constitutional: He is oriented to person, place, and time. He appears well-developed and well-nourished. No distress.  HENT: Nose: Nose normal.  Mouth/Throat: Oropharynx is clear and moist.  Eyes: Bilateral scleral icterus improved from yesterday Neck: Neck supple. No tracheal deviation present.  Cardiovascular: Normal rate, regular rhythm, normal heart sounds and intact distal pulses.  Pulmonary/Chest: Effort normal and breath sounds normal. No accessory muscle usage. No respiratory distress. He exhibits mild tenderness with palpation.  Port  left chest without sign of infection.  Abdominal: Soft. Bowel sounds are normal. Mild epigastric tenderness to palpation. He exhibits no distension and no mass. There is no rebound and no guarding.  Genitourinary: No cva tenderness  Musculoskeletal: Normal range of motion. He exhibits no edema and no tenderness.  Neurological: He is alert and oriented to person, place, and time.  Skin: Skin is warm and dry. He is not diaphoretic.  Psychiatric: He has a normal mood and affect.   Data Reviewed: Basic Metabolic Panel:  Recent Labs Lab 04/07/13 0924 04/08/13 0635  NA 141 139  K 3.3* 3.9  CL 105 105  CO2 22 20  GLUCOSE 104* 116*  BUN 7 5*  CREATININE 0.53 0.53  CALCIUM 9.0 8.6  MG  --  1.6   Liver Function Tests:  Recent Labs Lab 04/07/13 0924 04/08/13 0635  AST 30 22  ALT 21 14  ALKPHOS 90 84  BILITOT 4.6* 3.6*  PROT 7.8 7.1  ALBUMIN 4.0 3.6   No results found for this basename: LIPASE, AMYLASE,  in the last 168 hours No results found for this basename: AMMONIA,  in the last 168 hours CBC:  Recent Labs Lab 04/07/13 0924 04/08/13 0635  WBC 21.8* 25.1*  NEUTROABS  --  16.5*  HGB 7.9* 7.3*  HCT 23.3* 21.6*  MCV 92.1 92.7  PLT 671* 599*   Cardiac Enzymes: No results found for this basename: CKTOTAL, CKMB, CKMBINDEX, TROPONINI,  in the last 168 hours BNP (last 3 results)  Recent Labs  11/13/12 1826  PROBNP 106.3   CBG: No results found for this basename: GLUCAP,  in the last 168 hours  No results found for this or any previous visit (from the past 240 hour(s)).   Studies: Dg Chest 2 View  04/07/2013   CLINICAL DATA:  Sickle cell crisis and cough.  EXAM: CHEST  2 VIEW  COMPARISON:  DG CHEST 1V PORT dated 03/28/2013  FINDINGS: Prominent interstitial lung markings suggest chronic changes. No focal airspace disease or overt pulmonary edema. There is a left subclavian Port-A-Cath. Catheter tip in the lower SVC. Heart size is normal and negative for pleural  effusions.  IMPRESSION: Chronic lung changes without acute findings.   Electronically Signed   By: Markus Daft M.D.   On: 04/07/2013 10:55   Scheduled Meds: . folic acid  1 mg Oral q morning - 10a  . HYDROmorphone PCA 0.3 mg/mL   Intravenous 6 times per day  . hydroxyurea  500 mg Oral BID WC  . ketorolac  30 mg Intravenous 4 times per day  . morphine  15 mg Oral BID  . pantoprazole  40 mg Oral Daily   Or  . pantoprazole (PROTONIX) IV  40 mg Intravenous Daily  . potassium chloride SA  20 mEq Oral q morning - 10a  . Rivaroxaban  20 mg Oral Q supper  . senna-docusate  1 tablet Oral BID   Continuous Infusions: . dextrose 5 % and 0.45 % NaCl with KCl  20 mEq/L 1,000 mL (04/08/13 0003)   Principal Problem:   Chronic anticoagulation Active Problems:   Avascular necrosis   Mood disorder   Hypertension   Sickle cell hemolytic anemia   Hx of pulmonary embolus   Leukocytosis, unspecified   Low back pain   Sickle cell pain crisis   Vaso-occlusive sickle cell crisis  [facility] Pager [phone]. If 7PM-7AM, please contact night-coverage at [email or web address], password [facility] 04/08/2013, 9:19 AM  LOS: 1 day

## 2013-04-09 ENCOUNTER — Inpatient Hospital Stay (HOSPITAL_COMMUNITY): Payer: Medicare Other

## 2013-04-09 DIAGNOSIS — I2782 Chronic pulmonary embolism: Secondary | ICD-10-CM | POA: Diagnosis not present

## 2013-04-09 DIAGNOSIS — R079 Chest pain, unspecified: Secondary | ICD-10-CM | POA: Diagnosis not present

## 2013-04-09 DIAGNOSIS — D5700 Hb-SS disease with crisis, unspecified: Secondary | ICD-10-CM | POA: Diagnosis not present

## 2013-04-09 DIAGNOSIS — I82C29 Chronic embolism and thrombosis of unspecified internal jugular vein: Secondary | ICD-10-CM | POA: Diagnosis not present

## 2013-04-09 DIAGNOSIS — R17 Unspecified jaundice: Secondary | ICD-10-CM | POA: Diagnosis not present

## 2013-04-09 DIAGNOSIS — D72829 Elevated white blood cell count, unspecified: Secondary | ICD-10-CM | POA: Diagnosis not present

## 2013-04-09 DIAGNOSIS — M8700 Idiopathic aseptic necrosis of unspecified bone: Secondary | ICD-10-CM | POA: Diagnosis not present

## 2013-04-09 DIAGNOSIS — E876 Hypokalemia: Secondary | ICD-10-CM | POA: Diagnosis not present

## 2013-04-09 DIAGNOSIS — Z7901 Long term (current) use of anticoagulants: Secondary | ICD-10-CM | POA: Diagnosis not present

## 2013-04-09 LAB — DIFFERENTIAL
Basophils Absolute: 0.3 10*3/uL — ABNORMAL HIGH (ref 0.0–0.1)
Basophils Relative: 1 % (ref 0–1)
EOS ABS: 1.8 10*3/uL — AB (ref 0.0–0.7)
Eosinophils Relative: 7 % — ABNORMAL HIGH (ref 0–5)
LYMPHS PCT: 17 % (ref 12–46)
Lymphs Abs: 4.3 10*3/uL — ABNORMAL HIGH (ref 0.7–4.0)
Monocytes Absolute: 2.8 10*3/uL — ABNORMAL HIGH (ref 0.1–1.0)
Monocytes Relative: 11 % (ref 3–12)
NEUTROS PCT: 64 % (ref 43–77)
Neutro Abs: 16 10*3/uL — ABNORMAL HIGH (ref 1.7–7.7)

## 2013-04-09 LAB — CBC
HEMATOCRIT: 22.3 % — AB (ref 39.0–52.0)
HEMOGLOBIN: 7.6 g/dL — AB (ref 13.0–17.0)
MCH: 31.3 pg (ref 26.0–34.0)
MCHC: 34.1 g/dL (ref 30.0–36.0)
MCV: 91.8 fL (ref 78.0–100.0)
Platelets: 610 10*3/uL — ABNORMAL HIGH (ref 150–400)
RBC: 2.43 MIL/uL — ABNORMAL LOW (ref 4.22–5.81)
RDW: 19.9 % — ABNORMAL HIGH (ref 11.5–15.5)
WBC: 25.2 10*3/uL — AB (ref 4.0–10.5)

## 2013-04-09 LAB — COMPREHENSIVE METABOLIC PANEL
ALBUMIN: 3.8 g/dL (ref 3.5–5.2)
ALT: 14 U/L (ref 0–53)
AST: 23 U/L (ref 0–37)
Alkaline Phosphatase: 91 U/L (ref 39–117)
BUN: 7 mg/dL (ref 6–23)
CO2: 22 mEq/L (ref 19–32)
Calcium: 8.8 mg/dL (ref 8.4–10.5)
Chloride: 105 mEq/L (ref 96–112)
Creatinine, Ser: 0.55 mg/dL (ref 0.50–1.35)
GFR calc non Af Amer: >90 mL/min (ref >90)
GLUCOSE: 114 mg/dL — AB (ref 70–99)
POTASSIUM: 4.3 meq/L (ref 3.7–5.3)
Sodium: 139 mEq/L (ref 137–147)
TOTAL PROTEIN: 7.5 g/dL (ref 6.0–8.3)
Total Bilirubin: 4.4 mg/dL — ABNORMAL HIGH (ref 0.3–1.2)

## 2013-04-09 MED ORDER — HYDROMORPHONE HCL PF 2 MG/ML IJ SOLN
1.2000 mg | Freq: Once | INTRAMUSCULAR | Status: AC
  Administered 2013-04-09: 1.2 mg via INTRAVENOUS

## 2013-04-09 MED ORDER — DEXTROSE-NACL 5-0.45 % IV SOLN
INTRAVENOUS | Status: DC
  Administered 2013-04-09 – 2013-04-11 (×2): via INTRAVENOUS

## 2013-04-09 NOTE — Progress Notes (Signed)
SICKLE CELL SERVICE PROGRESS NOTE  [name] ZDG:[id number] DOB: [birth year] DOA: 04/07/2013 PCP: [name],[name], MD  Assessment/Plan: Principal Problem:   Chronic anticoagulation Active Problems:   Hypertension   Avascular necrosis   Mood disorder   Sickle cell hemolytic anemia   Hx of pulmonary embolus   Leukocytosis, unspecified   Low back pain   Sickle cell pain crisis   Vaso-occlusive sickle cell crisis  1. Hb SS with crisis: Pt still rates pain at 7/10. He has used 63.21 mg with 88 demands and 83 deliveries. I will continue the PCA at current dosing and consider clinician assisted doses. Continue MS Contin and Toradol. Re-assess pain tomorrow. 2. Leukocytosis: No evidence of infection. Likely related to crisis but will keep monitoring WBC and looking for any signs of infection. 3. Chronic Anticoagulation secondary to PE: Continue Xarelto: 4. HTN: BP well controlled on current medications 5. Sickle Cell Disease: Pt on Hydrea and Folic acid. Continue  Code Status: Full Code Family Communication: N/A Disposition Plan:  Not yet stable for discharge  [name],[name]  Pager [phone]. If 7PM-7AM, please contact night-coverage.  04/09/2013, 7:30 PM  LOS: 2 days   Brief narrative: [name] is a 33 y.o. male reporting to ER with symptoms of left-sided abdominal pain for past 3 days and some vomiting; but denies diarrhea. He vomited in the ER while being triaged. Associated symptoms: no chest pain, no cough, no fever, no headaches, no shortness of breath and no sore throat but has abdominal pain, no chills, no headaches and no sore throat. He was recently discharged from hospital with an admission for acute chest syndrome. He reports that he had been doing well until 3 days ago when he developed recurrent epigastric abdominal pain. He had been taking doxycycline since being discharged but missed yesterday's doses. He reports that he had been taking his home  medications but has not been able to get his pain under adequate control. He came to the ER and received several doses of IV dilaudid but that was not enough to get his pain under control so that he could be managed at home. He also reports increasing pruritus and reports poor appetite over the past 24 hours. He denies diarrhea.   Consultants:  None  Procedures:  None  Antibiotics:  None  HPI/Subjective: Pt states that he has pain located in ribs which is his usual site of pain when in crisis. He rates pain at 7/10, non-radiating and throbbing in nature. Pt states that he had a BM yesterday.  Objective: Filed Vitals:   04/09/13 1400 04/09/13 1650 04/09/13 1810 04/09/13 1846  BP: 133/87  137/89   Pulse: 100  86   Temp: 98.4 F (36.9 C)  99.7 F (37.6 C)   TempSrc: Oral  Oral   Resp: 18 19 20 16   Height:      Weight:      SpO2: 98% 94% 91% 94%   Weight change:   Intake/Output Summary (Last 24 hours) at 04/09/13 1930 Last data filed at 04/09/13 1445  Gross per 24 hour  Intake 1958.33 ml  Output   2850 ml  Net -891.67 ml    General: Alert, awake, oriented x3, in mild distress secondary to pain.  HEENT: [place]/AT PEERL, EOMI, mild icterus Neck: Trachea midline,  no masses, no thyromegal,y no JVD, no carotid bruit OROPHARYNX:  Moist, No exudate/ erythema/lesions.  Heart: Regular rate and rhythm, without murmurs, rubs, gallops.  Lungs: Clear to auscultation, no  wheezing or rhonchi noted. Abdomen: Soft, nontender, nondistended, positive bowel sounds, no masses no hepatosplenomegaly noted.  Neuro: No focal neurological deficits noted cranial nerves II through XII grossly intact.  Strength normal in bilateral upper and lower extremities. Musculoskeletal: No warm swelling or erythema around joints, no spinal tenderness noted. Psychiatric: Patient alert and oriented x3, good insight and cognition, good recent to remote recall. Lymph node survey: No cervical axillary or inguinal  lymphadenopathy noted.   Data Reviewed: Basic Metabolic Panel:  Recent Labs Lab 04/07/13 0924 04/08/13 0635 04/09/13 0600  NA 141 139 139  K 3.3* 3.9 4.3  CL 105 105 105  CO2 22 20 22   GLUCOSE 104* 116* 114*  BUN 7 5* 7  CREATININE 0.53 0.53 0.55  CALCIUM 9.0 8.6 8.8  MG  --  1.6  --    Liver Function Tests:  Recent Labs Lab 04/07/13 0924 04/08/13 0635 04/09/13 0600  AST 30 22 23   ALT 21 14 14   ALKPHOS 90 84 91  BILITOT 4.6* 3.6* 4.4*  PROT 7.8 7.1 7.5  ALBUMIN 4.0 3.6 3.8   No results found for this basename: LIPASE, AMYLASE,  in the last 168 hours No results found for this basename: AMMONIA,  in the last 168 hours CBC:  Recent Labs Lab 04/07/13 0924 04/08/13 0635 04/09/13 0600  WBC 21.8* 25.1* 25.2*  NEUTROABS  --  16.5* 16.0*  HGB 7.9* 7.3* 7.6*  HCT 23.3* 21.6* 22.3*  MCV 92.1 92.7 91.8  PLT 671* 599* 610*   Cardiac Enzymes: No results found for this basename: CKTOTAL, CKMB, CKMBINDEX, TROPONINI,  in the last 168 hours BNP (last 3 results)  Recent Labs  11/13/12 1826  PROBNP 106.3   CBG: No results found for this basename: GLUCAP,  in the last 168 hours  No results found for this or any previous visit (from the past 240 hour(s)).   Studies: Dg Chest 2 View  04/09/2013   CLINICAL DATA:  Bilateral lower rib pain  EXAM: CHEST  2 VIEW  COMPARISON:  DG CHEST 2 VIEW dated 04/07/2013; DG CHEST 2 VIEW dated 03/22/2013; DG CHEST 1V PORT dated 03/28/2013; CT ANGIO CHEST W/CM &/OR WO/CM dated 03/26/2013  FINDINGS: Grossly unchanged enlarged cardiac silhouette and mediastinal contours with nodular prominence of the bilateral pulmonary hila compatible with the mediastinal and hilar adenopathy demonstrated on recently performed chest CT. The lungs remain hyperexpanded with flattening of the bilateral hemidiaphragms mild diffuse slightly nodular thickening of the pulmonary interstitium. Stable positioning of support apparatus. No new focal airspace opacities. No  pleural effusion or pneumothorax. No definite evidence of edema. Grossly unchanged bones.  IMPRESSION: 1.  No acute cardiopulmonary disease. 2. Grossly unchanged findings of cardiomegaly and mediastinal/hilar adenopathy as demonstrated on recently performed chest CT.   Electronically Signed   By: Sandi Mariscal M.D.   On: 04/09/2013 08:45   Dg Chest 2 View  04/07/2013   CLINICAL DATA:  Sickle cell crisis and cough.  EXAM: CHEST  2 VIEW  COMPARISON:  DG CHEST 1V PORT dated 03/28/2013  FINDINGS: Prominent interstitial lung markings suggest chronic changes. No focal airspace disease or overt pulmonary edema. There is a left subclavian Port-A-Cath. Catheter tip in the lower SVC. Heart size is normal and negative for pleural effusions.  IMPRESSION: Chronic lung changes without acute findings.   Electronically Signed   By: Markus Daft M.D.   On: 04/07/2013 10:55   Dg Chest 2 View  03/22/2013   CLINICAL DATA:  Former  smoking history, leukocytosis, history of sickle cell disease  EXAM: CHEST  2 VIEW  COMPARISON:  Portable chest x-ray of 02/14/2012  FINDINGS: No active infiltrate or effusion is seen. Indistinct perihilar vasculature may indicate very minimal pulmonary vascular congestion. The heart is mildly enlarged and stable. A left-sided Port-A-Cath tip remains at the expected SVC-RA junction.  IMPRESSION: Stable mild cardiomegaly with minimal pulmonary vascular congestion.   Electronically Signed   By: Ivar Drape M.D.   On: 03/22/2013 11:20   Ct Angio Chest Pe W/cm &/or Wo Cm  03/26/2013   CLINICAL DATA:  Hypoxemia.  Known history of pulmonary embolism.  EXAM: CT ANGIOGRAPHY CHEST WITH CONTRAST  TECHNIQUE: Multidetector CT imaging of the chest was performed using the standard protocol during bolus administration of intravenous contrast. Multiplanar CT image reconstructions and MIPs were obtained to evaluate the vascular anatomy.  CONTRAST:  165mL OMNIPAQUE IOHEXOL 350 MG/ML SOLN  COMPARISON:  DG CHEST 2 VIEW dated  03/22/2013; CT CHEST W/CM dated 11/17/2012; CT ANGIO CHEST W/CM &/OR WO/CM dated 11/13/2012  FINDINGS: Technically adequate study without pulmonary embolus. Aorta and branch vessels are within normal limits. There is no axillary adenopathy. Left subclavian Port-A-Cath is unchanged with the tip in the right atrium. Cardiomegaly is present with right heart enlargement. There is also enlargement of the left and right pulmonary arteries suggesting pulmonary arterial hypertension. Bilateral gynecomastia is incidentally noted.  There are scattered foci of peripheral ground-glass attenuation in both lungs, a finding which can be associated with acute chest syndrome and small vessel infarction with hemorrhagic edema. Bronchopneumonia and small vessel disease are in the differential considerations. Incidental imaging of the upper abdomen is grossly within normal limits. Mediastinal adenopathy appears similar to prior exam. Unchanged right hilar adenopathy. Notably, no pleural effusions. Sickle cell endplate infarcts are present in the thoracic spine with multiple Schmorl's nodes.  Review of the MIP images confirms the above findings.  IMPRESSION: 1. Negative for pulmonary embolus. 2. Scattered areas of ground-glass attenuation bilaterally in the lungs suggesting acute chest syndrome in this patient with sickle cell anemia. 3. Unchanged cardiomegaly it and mediastinal and hilar adenopathy. 4. Enlargement of the left and right pulmonary arteries and right heart enlargement suggesting pulmonary arterial hypertension.   Electronically Signed   By: Dereck Ligas M.D.   On: 03/26/2013 16:46   Dg Chest Port 1 View  03/28/2013   CLINICAL DATA:  Hypoxia  EXAM: PORTABLE CHEST - 1 VIEW  COMPARISON:  CT ANGIO CHEST W/CM &/OR WO/CM dated 03/26/2013; DG CHEST 2 VIEW dated 03/22/2013  FINDINGS: Stable left subclavian Port-A-Cath. Cardiomegaly. Clear lungs. No pneumothorax.  IMPRESSION: No active disease.   Electronically Signed   By: Maryclare Bean M.D.   On: 03/28/2013 07:43    Scheduled Meds: . folic acid  1 mg Oral q morning - 10a  . HYDROmorphone PCA 0.3 mg/mL   Intravenous 6 times per day  . hydroxyurea  500 mg Oral BID WC  . ketorolac  30 mg Intravenous 4 times per day  . morphine  15 mg Oral BID  . pantoprazole  40 mg Oral Daily  . potassium chloride SA  20 mEq Oral q morning - 10a  . Rivaroxaban  20 mg Oral Q supper  . senna-docusate  1 tablet Oral BID   Continuous Infusions: . dextrose 5 % and 0.45 % NaCl with KCl 20 mEq/L 50 mL/hr at 04/09/13 0528    Total time spent 37 minutes.

## 2013-04-10 DIAGNOSIS — M8700 Idiopathic aseptic necrosis of unspecified bone: Secondary | ICD-10-CM | POA: Diagnosis not present

## 2013-04-10 DIAGNOSIS — I2782 Chronic pulmonary embolism: Secondary | ICD-10-CM | POA: Diagnosis not present

## 2013-04-10 DIAGNOSIS — R17 Unspecified jaundice: Secondary | ICD-10-CM | POA: Diagnosis not present

## 2013-04-10 DIAGNOSIS — I82C29 Chronic embolism and thrombosis of unspecified internal jugular vein: Secondary | ICD-10-CM | POA: Diagnosis not present

## 2013-04-10 DIAGNOSIS — D5700 Hb-SS disease with crisis, unspecified: Secondary | ICD-10-CM | POA: Diagnosis not present

## 2013-04-10 DIAGNOSIS — E876 Hypokalemia: Secondary | ICD-10-CM | POA: Diagnosis not present

## 2013-04-10 LAB — CBC WITH DIFFERENTIAL/PLATELET
Basophils Absolute: 0.1 10*3/uL (ref 0.0–0.1)
Basophils Relative: 1 % (ref 0–1)
EOS ABS: 1.4 10*3/uL — AB (ref 0.0–0.7)
Eosinophils Relative: 6 % — ABNORMAL HIGH (ref 0–5)
HCT: 21.2 % — ABNORMAL LOW (ref 39.0–52.0)
Hemoglobin: 7.4 g/dL — ABNORMAL LOW (ref 13.0–17.0)
Lymphocytes Relative: 13 % (ref 12–46)
Lymphs Abs: 3 10*3/uL (ref 0.7–4.0)
MCH: 31.9 pg (ref 26.0–34.0)
MCHC: 34.9 g/dL (ref 30.0–36.0)
MCV: 91.4 fL (ref 78.0–100.0)
Monocytes Absolute: 2.8 10*3/uL — ABNORMAL HIGH (ref 0.1–1.0)
Monocytes Relative: 12 % (ref 3–12)
NEUTROS PCT: 69 % (ref 43–77)
Neutro Abs: 16.2 10*3/uL — ABNORMAL HIGH (ref 1.7–7.7)
PLATELETS: 633 10*3/uL — AB (ref 150–400)
RBC: 2.32 MIL/uL — ABNORMAL LOW (ref 4.22–5.81)
RDW: 19.9 % — ABNORMAL HIGH (ref 11.5–15.5)
WBC: 23.4 10*3/uL — ABNORMAL HIGH (ref 4.0–10.5)

## 2013-04-10 LAB — COMPREHENSIVE METABOLIC PANEL
ALK PHOS: 83 U/L (ref 39–117)
ALT: 13 U/L (ref 0–53)
AST: 23 U/L (ref 0–37)
Albumin: 3.9 g/dL (ref 3.5–5.2)
BUN: 9 mg/dL (ref 6–23)
CO2: 24 mEq/L (ref 19–32)
Calcium: 8.7 mg/dL (ref 8.4–10.5)
Chloride: 104 mEq/L (ref 96–112)
Creatinine, Ser: 0.55 mg/dL (ref 0.50–1.35)
GFR calc Af Amer: >90 mL/min (ref >90)
GFR calc non Af Amer: >90 mL/min (ref >90)
Glucose, Bld: 103 mg/dL — ABNORMAL HIGH (ref 70–99)
POTASSIUM: 4.3 meq/L (ref 3.7–5.3)
SODIUM: 139 meq/L (ref 137–147)
TOTAL PROTEIN: 7.1 g/dL (ref 6.0–8.3)
Total Bilirubin: 5.4 mg/dL — ABNORMAL HIGH (ref 0.3–1.2)

## 2013-04-10 MED ORDER — HYDROMORPHONE HCL PF 2 MG/ML IJ SOLN
2.0000 mg | INTRAMUSCULAR | Status: DC | PRN
  Administered 2013-04-10 – 2013-04-13 (×17): 2 mg via INTRAVENOUS
  Filled 2013-04-10 (×16): qty 1

## 2013-04-10 NOTE — Progress Notes (Signed)
Patient oxygen level on RA was 96-97%. Patient ambulated O2 sat was 100%.Sandie Ano RN

## 2013-04-10 NOTE — Progress Notes (Signed)
SICKLE CELL SERVICE PROGRESS NOTE  [name] DOB: 09-18-1980 DOA: 04/07/2013 PCP: Domanique Huesman A., MD  Assessment/Plan: Principal Problem:   Chronic anticoagulation Active Problems:   Hypertension   Avascular necrosis   Mood disorder   Sickle cell hemolytic anemia   Hx of pulmonary embolus   Leukocytosis, unspecified   Low back pain   Sickle cell pain crisis   Vaso-occlusive sickle cell crisis  1. Hb SS with crisis: Pt is having pain in the right thigh which is new since yesterday. He rates the pain in his thigh as 8/10 the pain in his ribs he rates pain at 7/10. His platelet count is increasing which indicates ongoing active crisis.He has used 68.81 mg with 102 demands and 87 deliveries. I will continue the PCA at current dosing and add clinician assisted doses. Continue MS Contin and Toradol. Re-assess pain tomorrow. 2. Leukocytosis: No evidence of infection. Likely related to crisis but will keep monitoring WBC and looking for any signs of infection. 3. Chronic Anticoagulation secondary to PE: Continue Xarelto: 4. HTN: BP well controlled on current medications 5. Sickle Cell Disease: Pt on Hydrea and Folic acid. Continue  Code Status: Full Code Family Communication: N/A Disposition Plan:  Not yet stable for discharge  [name] A.  Pager [phone]. If 7PM-7AM, please contact night-coverage.  04/10/2013, 3:22 PM  LOS: 3 days   Brief narrative: ALCARIO [name] is a 33 y.o. male reporting to ER with symptoms of left-sided abdominal pain for past 3 days and some vomiting; but denies diarrhea. He vomited in the ER while being triaged. Associated symptoms: no chest pain, no cough, no fever, no headaches, no shortness of breath and no sore throat but has abdominal pain, no chills, no headaches and no sore throat. He was recently discharged from hospital with an admission for acute chest syndrome. He reports that he had been doing well until 3 days ago  when he developed recurrent epigastric abdominal pain. He had been taking doxycycline since being discharged but missed yesterday's doses. He reports that he had been taking his home medications but has not been able to get his pain under adequate control. He came to the ER and received several doses of IV dilaudid but that was not enough to get his pain under control so that he could be managed at home. He also reports increasing pruritus and reports poor appetite over the past 24 hours. He denies diarrhea.   Consultants:  None  Procedures:  None  Antibiotics:  None  HPI/Subjective:  Pt is having pain in the right thigh which is new since yesterday. He rates the pain in his thigh as 8/10 the pain in his ribs he rates pain at 7/10.He describes pain as non-radiating and throbbing in nature. Pt states that he had a BM yesterday.  Objective: Filed Vitals:   04/10/13 1052 04/10/13 1134 04/10/13 1325 04/10/13 1430  BP: 126/83   128/86  Pulse: 78   71  Temp: 98.5 F (36.9 C)   98.4 F (36.9 C)  TempSrc: Oral   Oral  Resp: 26 16 20 16   Height:      Weight:      SpO2: 97% 96% 93% 98%   Weight change:   Intake/Output Summary (Last 24 hours) at 04/10/13 1522 Last data filed at 04/10/13 1510  Gross per 24 hour  Intake   1840 ml  Output   2025 ml  Net   -185 ml    General: Alert,  awake, oriented x3, in mild distress secondary to pain.  HEENT: [place]/AT PEERL, EOMI,  Icterus increased since yesterday. Neck: Trachea midline,  no masses, no thyromegal,y no JVD, no carotid bruit OROPHARYNX:  Moist, No exudate/ erythema/lesions.  Heart: Regular rate and rhythm, without murmurs, rubs, gallops.  Lungs: Clear to auscultation, no wheezing or rhonchi noted. Abdomen: Soft, nontender, nondistended, positive bowel sounds, no masses no hepatosplenomegaly noted.  Neuro: No focal neurological deficits noted cranial nerves II through XII grossly intact.  Strength normal in bilateral upper and lower  extremities. Musculoskeletal: No warm swelling or erythema around joints, no spinal tenderness noted. Psychiatric: Patient alert and oriented x3, good insight and cognition, good recent to remote recall. Lymph node survey: No cervical axillary or inguinal lymphadenopathy noted.   Data Reviewed: Basic Metabolic Panel:  Recent Labs Lab 04/07/13 0924 04/08/13 0635 04/09/13 0600 04/10/13 0535  NA 141 139 139 139  K 3.3* 3.9 4.3 4.3  CL 105 105 105 104  CO2 22 20 22 24   GLUCOSE 104* 116* 114* 103*  BUN 7 5* 7 9  CREATININE 0.53 0.53 0.55 0.55  CALCIUM 9.0 8.6 8.8 8.7  MG  --  1.6  --   --    Liver Function Tests:  Recent Labs Lab 04/07/13 0924 04/08/13 0635 04/09/13 0600 04/10/13 0535  AST 30 22 23 23   ALT 21 14 14 13   ALKPHOS 90 84 91 83  BILITOT 4.6* 3.6* 4.4* 5.4*  PROT 7.8 7.1 7.5 7.1  ALBUMIN 4.0 3.6 3.8 3.9   No results found for this basename: LIPASE, AMYLASE,  in the last 168 hours No results found for this basename: AMMONIA,  in the last 168 hours CBC:  Recent Labs Lab 04/07/13 0924 04/08/13 0635 04/09/13 0600 04/10/13 0950  WBC 21.8* 25.1* 25.2* 23.4*  NEUTROABS  --  16.5* 16.0* 16.2*  HGB 7.9* 7.3* 7.6* 7.4*  HCT 23.3* 21.6* 22.3* 21.2*  MCV 92.1 92.7 91.8 91.4  PLT 671* 599* 610* 633*   Cardiac Enzymes: No results found for this basename: CKTOTAL, CKMB, CKMBINDEX, TROPONINI,  in the last 168 hours BNP (last 3 results)  Recent Labs  11/13/12 1826  PROBNP 106.3   CBG: No results found for this basename: GLUCAP,  in the last 168 hours  No results found for this or any previous visit (from the past 240 hour(s)).   Studies: Dg Chest 2 View  04/09/2013   CLINICAL DATA:  Bilateral lower rib pain  EXAM: CHEST  2 VIEW  COMPARISON:  DG CHEST 2 VIEW dated 04/07/2013; DG CHEST 2 VIEW dated 03/22/2013; DG CHEST 1V PORT dated 03/28/2013; CT ANGIO CHEST W/CM &/OR WO/CM dated 03/26/2013  FINDINGS: Grossly unchanged enlarged cardiac silhouette and mediastinal  contours with nodular prominence of the bilateral pulmonary hila compatible with the mediastinal and hilar adenopathy demonstrated on recently performed chest CT. The lungs remain hyperexpanded with flattening of the bilateral hemidiaphragms mild diffuse slightly nodular thickening of the pulmonary interstitium. Stable positioning of support apparatus. No new focal airspace opacities. No pleural effusion or pneumothorax. No definite evidence of edema. Grossly unchanged bones.  IMPRESSION: 1.  No acute cardiopulmonary disease. 2. Grossly unchanged findings of cardiomegaly and mediastinal/hilar adenopathy as demonstrated on recently performed chest CT.   Electronically Signed   By: Sandi Mariscal M.D.   On: 04/09/2013 08:45   Dg Chest 2 View  04/07/2013   CLINICAL DATA:  Sickle cell crisis and cough.  EXAM: CHEST  2 VIEW  COMPARISON:  DG CHEST 1V PORT dated 03/28/2013  FINDINGS: Prominent interstitial lung markings suggest chronic changes. No focal airspace disease or overt pulmonary edema. There is a left subclavian Port-A-Cath. Catheter tip in the lower SVC. Heart size is normal and negative for pleural effusions.  IMPRESSION: Chronic lung changes without acute findings.   Electronically Signed   By: Markus Daft M.D.   On: 04/07/2013 10:55   Dg Chest 2 View  03/22/2013   CLINICAL DATA:  Former smoking history, leukocytosis, history of sickle cell disease  EXAM: CHEST  2 VIEW  COMPARISON:  Portable chest x-ray of 02/14/2012  FINDINGS: No active infiltrate or effusion is seen. Indistinct perihilar vasculature may indicate very minimal pulmonary vascular congestion. The heart is mildly enlarged and stable. A left-sided Port-A-Cath tip remains at the expected SVC-RA junction.  IMPRESSION: Stable mild cardiomegaly with minimal pulmonary vascular congestion.   Electronically Signed   By: Ivar Drape M.D.   On: 03/22/2013 11:20   Ct Angio Chest Pe W/cm &/or Wo Cm  03/26/2013   CLINICAL DATA:  Hypoxemia.  Known history  of pulmonary embolism.  EXAM: CT ANGIOGRAPHY CHEST WITH CONTRAST  TECHNIQUE: Multidetector CT imaging of the chest was performed using the standard protocol during bolus administration of intravenous contrast. Multiplanar CT image reconstructions and MIPs were obtained to evaluate the vascular anatomy.  CONTRAST:  147mL OMNIPAQUE IOHEXOL 350 MG/ML SOLN  COMPARISON:  DG CHEST 2 VIEW dated 03/22/2013; CT CHEST W/CM dated 11/17/2012; CT ANGIO CHEST W/CM &/OR WO/CM dated 11/13/2012  FINDINGS: Technically adequate study without pulmonary embolus. Aorta and branch vessels are within normal limits. There is no axillary adenopathy. Left subclavian Port-A-Cath is unchanged with the tip in the right atrium. Cardiomegaly is present with right heart enlargement. There is also enlargement of the left and right pulmonary arteries suggesting pulmonary arterial hypertension. Bilateral gynecomastia is incidentally noted.  There are scattered foci of peripheral ground-glass attenuation in both lungs, a finding which can be associated with acute chest syndrome and small vessel infarction with hemorrhagic edema. Bronchopneumonia and small vessel disease are in the differential considerations. Incidental imaging of the upper abdomen is grossly within normal limits. Mediastinal adenopathy appears similar to prior exam. Unchanged right hilar adenopathy. Notably, no pleural effusions. Sickle cell endplate infarcts are present in the thoracic spine with multiple Schmorl's nodes.  Review of the MIP images confirms the above findings.  IMPRESSION: 1. Negative for pulmonary embolus. 2. Scattered areas of ground-glass attenuation bilaterally in the lungs suggesting acute chest syndrome in this patient with sickle cell anemia. 3. Unchanged cardiomegaly it and mediastinal and hilar adenopathy. 4. Enlargement of the left and right pulmonary arteries and right heart enlargement suggesting pulmonary arterial hypertension.   Electronically Signed    By: Dereck Ligas M.D.   On: 03/26/2013 16:46   Dg Chest Port 1 View  03/28/2013   CLINICAL DATA:  Hypoxia  EXAM: PORTABLE CHEST - 1 VIEW  COMPARISON:  CT ANGIO CHEST W/CM &/OR WO/CM dated 03/26/2013; DG CHEST 2 VIEW dated 03/22/2013  FINDINGS: Stable left subclavian Port-A-Cath. Cardiomegaly. Clear lungs. No pneumothorax.  IMPRESSION: No active disease.   Electronically Signed   By: Maryclare Bean M.D.   On: 03/28/2013 07:43    Scheduled Meds: . folic acid  1 mg Oral q morning - 10a  . HYDROmorphone PCA 0.3 mg/mL   Intravenous 6 times per day  . hydroxyurea  500 mg Oral BID WC  . ketorolac  30 mg Intravenous 4 times  per day  . morphine  15 mg Oral BID  . pantoprazole  40 mg Oral Daily  . potassium chloride SA  20 mEq Oral q morning - 10a  . Rivaroxaban  20 mg Oral Q supper  . senna-docusate  1 tablet Oral BID   Continuous Infusions: . dextrose 5 % and 0.45% NaCl 20 mL/hr at 04/09/13 2052    Total time spent 35 minutes.

## 2013-04-11 DIAGNOSIS — R17 Unspecified jaundice: Secondary | ICD-10-CM | POA: Diagnosis not present

## 2013-04-11 DIAGNOSIS — D5700 Hb-SS disease with crisis, unspecified: Secondary | ICD-10-CM | POA: Diagnosis not present

## 2013-04-11 DIAGNOSIS — I2782 Chronic pulmonary embolism: Secondary | ICD-10-CM | POA: Diagnosis not present

## 2013-04-11 DIAGNOSIS — I82C29 Chronic embolism and thrombosis of unspecified internal jugular vein: Secondary | ICD-10-CM | POA: Diagnosis not present

## 2013-04-11 DIAGNOSIS — E876 Hypokalemia: Secondary | ICD-10-CM | POA: Diagnosis not present

## 2013-04-11 DIAGNOSIS — M8700 Idiopathic aseptic necrosis of unspecified bone: Secondary | ICD-10-CM | POA: Diagnosis not present

## 2013-04-11 LAB — CBC WITH DIFFERENTIAL/PLATELET
Basophils Absolute: 0.1 10*3/uL (ref 0.0–0.1)
Basophils Absolute: 0.2 10*3/uL — ABNORMAL HIGH (ref 0.0–0.1)
Basophils Relative: 1 % (ref 0–1)
Basophils Relative: 1 % (ref 0–1)
EOS PCT: 4 % (ref 0–5)
EOS PCT: 4 % (ref 0–5)
Eosinophils Absolute: 1 10*3/uL — ABNORMAL HIGH (ref 0.0–0.7)
Eosinophils Absolute: 1.1 10*3/uL — ABNORMAL HIGH (ref 0.0–0.7)
HCT: 22.1 % — ABNORMAL LOW (ref 39.0–52.0)
HEMATOCRIT: 21.2 % — AB (ref 39.0–52.0)
Hemoglobin: 7.3 g/dL — ABNORMAL LOW (ref 13.0–17.0)
Hemoglobin: 7.1 g/dL — ABNORMAL LOW (ref 13.0–17.0)
LYMPHS ABS: 3 10*3/uL (ref 0.7–4.0)
LYMPHS ABS: 2.7 10*3/uL (ref 0.7–4.0)
LYMPHS PCT: 13 % (ref 12–46)
LYMPHS PCT: 11 % — AB (ref 12–46)
MCH: 30.5 pg (ref 26.0–34.0)
MCH: 30.7 pg (ref 26.0–34.0)
MCHC: 33 g/dL (ref 30.0–36.0)
MCHC: 33.5 g/dL (ref 30.0–36.0)
MCV: 92.5 fL (ref 78.0–100.0)
MCV: 91.8 fL (ref 78.0–100.0)
MONO ABS: 2.8 10*3/uL — AB (ref 0.1–1.0)
MONOS PCT: 12 % (ref 3–12)
Monocytes Absolute: 2.9 10*3/uL — ABNORMAL HIGH (ref 0.1–1.0)
Monocytes Relative: 13 % — ABNORMAL HIGH (ref 3–12)
NEUTROS PCT: 69 % (ref 43–77)
Neutro Abs: 15.9 10*3/uL — ABNORMAL HIGH (ref 1.7–7.7)
Neutro Abs: 17.9 10*3/uL — ABNORMAL HIGH (ref 1.7–7.7)
Neutrophils Relative %: 73 % (ref 43–77)
PLATELETS: 642 10*3/uL — AB (ref 150–400)
Platelets: 631 10*3/uL — ABNORMAL HIGH (ref 150–400)
RBC: 2.39 MIL/uL — ABNORMAL LOW (ref 4.22–5.81)
RBC: 2.31 MIL/uL — AB (ref 4.22–5.81)
RDW: 20.1 % — ABNORMAL HIGH (ref 11.5–15.5)
RDW: 20.1 % — ABNORMAL HIGH (ref 11.5–15.5)
WBC: 22.9 10*3/uL — AB (ref 4.0–10.5)
WBC: 24.7 10*3/uL — ABNORMAL HIGH (ref 4.0–10.5)

## 2013-04-11 NOTE — Progress Notes (Signed)
Subjective: A 33 yo admitted with sickle cell painful crisis. Patient has used 58 mg of Dilaudid in the last 24 hours with 79 demands and 72 deliveries. He is having a 6/10 pain in the right thigh. No fever, No NVD.  Objective: Vital signs in last 24 hours: Temp:  [98.4 F (36.9 C)-98.9 F (37.2 C)] 98.5 F (36.9 C) (03/12 0429) Pulse Rate:  [71-89] 71 (03/12 0429) Resp:  [15-23] 18 (03/12 1249) BP: (128-148)/(81-99) 134/81 mmHg (03/12 0429) SpO2:  [93 %-98 %] 97 % (03/12 1249) Weight change:  Last BM Date: 04/10/13  Intake/Output from previous day: 03/11 0701 - 03/12 0700 In: 1360 [P.O.:1200; I.V.:160] Out: 2000 [Urine:2000] Intake/Output this shift:    General appearance: alert, cooperative and no distress Eyes: conjunctivae/corneas clear. PERRL, EOM's intact. Fundi benign. Neck: no adenopathy, no carotid bruit, no JVD, supple, symmetrical, trachea midline and thyroid not enlarged, symmetric, no tenderness/mass/nodules Back: symmetric, no curvature. ROM normal. No CVA tenderness. Chest wall: no tenderness Cardio: regular rate and rhythm, S1, S2 normal, no murmur, click, rub or gallop GI: soft, non-tender; bowel sounds normal; no masses,  no organomegaly Extremities: extremities normal, atraumatic, no cyanosis or edema Pulses: 2+ and symmetric Skin: Skin color, texture, turgor normal. No rashes or lesions Neurologic: Grossly normal  Lab Results:  Recent Labs  04/10/13 0950 04/11/13 0540  WBC 23.4* 22.9*  HGB 7.4* 7.3*  HCT 21.2* 22.1*  PLT 633* 642*   BMET  Recent Labs  04/09/13 0600 04/10/13 0535  NA 139 139  K 4.3 4.3  CL 105 104  CO2 22 24  GLUCOSE 114* 103*  BUN 7 9  CREATININE 0.55 0.55  CALCIUM 8.8 8.7    Studies/Results: No results found.  Medications: I have reviewed the patient's current medications.  Assessment/Plan: A 33 yo man admitted with Sickle cell painful crisis.  #1 Sickle cell painful Crisis: Patient seems to be doing better  with pain control with the Dilaudid PCa and the breakthrough dose. Will keep him onm tyhis regimen until tomorrow.  #2 Sickle Cell anemia: H/H has been stable.   #3 Hx of PE: Continue Xarelto.  #4 AVN: Stable. Continue medications.  #5 Mood Disorder: Stable.    LOS: 4 days   [name],[name] 04/11/2013, 2:09 PM

## 2013-04-12 DIAGNOSIS — R17 Unspecified jaundice: Secondary | ICD-10-CM | POA: Diagnosis not present

## 2013-04-12 DIAGNOSIS — M8700 Idiopathic aseptic necrosis of unspecified bone: Secondary | ICD-10-CM | POA: Diagnosis not present

## 2013-04-12 DIAGNOSIS — D5700 Hb-SS disease with crisis, unspecified: Secondary | ICD-10-CM | POA: Diagnosis not present

## 2013-04-12 DIAGNOSIS — I82C29 Chronic embolism and thrombosis of unspecified internal jugular vein: Secondary | ICD-10-CM | POA: Diagnosis not present

## 2013-04-12 DIAGNOSIS — E876 Hypokalemia: Secondary | ICD-10-CM | POA: Diagnosis not present

## 2013-04-12 DIAGNOSIS — I2782 Chronic pulmonary embolism: Secondary | ICD-10-CM | POA: Diagnosis not present

## 2013-04-12 LAB — COMPREHENSIVE METABOLIC PANEL
ALT: 17 U/L (ref 0–53)
AST: 28 U/L (ref 0–37)
Albumin: 4 g/dL (ref 3.5–5.2)
Alkaline Phosphatase: 92 U/L (ref 39–117)
BUN: 11 mg/dL (ref 6–23)
CO2: 23 meq/L (ref 19–32)
CREATININE: 0.64 mg/dL (ref 0.50–1.35)
Calcium: 8.8 mg/dL (ref 8.4–10.5)
Chloride: 105 mEq/L (ref 96–112)
GLUCOSE: 102 mg/dL — AB (ref 70–99)
Potassium: 4.6 mEq/L (ref 3.7–5.3)
Sodium: 141 mEq/L (ref 137–147)
Total Bilirubin: 4.6 mg/dL — ABNORMAL HIGH (ref 0.3–1.2)
Total Protein: 7.4 g/dL (ref 6.0–8.3)

## 2013-04-12 NOTE — Progress Notes (Signed)
Subjective: A 33 yo admitted with sickle cell painful crisis. Patient has used 63.8mg  of Dilaudid in the last 24 hours with 83 demands and 79 deliveries. He is having a 6/10 pain in the right thigh. No fever, No NVD. No other complaints.  Objective: Vital signs in last 24 hours: Temp:  [98.2 F (36.8 C)-98.8 F (37.1 C)] 98.8 F (37.1 C) (03/13 0945) Pulse Rate:  [67-80] 67 (03/13 0945) Resp:  [14-23] 14 (03/13 1105) BP: (122-134)/(82-87) 125/87 mmHg (03/13 0945) SpO2:  [93 %-97 %] 96 % (03/13 0945) FiO2 (%):  [36 %-97 %] 97 % (03/13 1323) Weight change:  Last BM Date: 04/11/13  Intake/Output from previous day: 03/12 0701 - 03/13 0700 In: 480 [P.O.:480] Out: 2800 [Urine:2800] Intake/Output this shift: Total I/O In: 240 [P.O.:240] Out: 975 [Urine:975]  General appearance: alert, cooperative and no distress Eyes: conjunctivae/corneas clear. PERRL, EOM's intact. Fundi benign. Neck: no adenopathy, no carotid bruit, no JVD, supple, symmetrical, trachea midline and thyroid not enlarged, symmetric, no tenderness/mass/nodules Back: symmetric, no curvature. ROM normal. No CVA tenderness. Chest wall: no tenderness Cardio: regular rate and rhythm, S1, S2 normal, no murmur, click, rub or gallop GI: soft, non-tender; bowel sounds normal; no masses,  no organomegaly Extremities: extremities normal, atraumatic, no cyanosis or edema Pulses: 2+ and symmetric Skin: Skin color, texture, turgor normal. No rashes or lesions Neurologic: Grossly normal  Lab Results:  Recent Labs  04/11/13 0540 04/11/13 1635  WBC 22.9* 24.7*  HGB 7.3* 7.1*  HCT 22.1* 21.2*  PLT 642* 631*   BMET  Recent Labs  04/10/13 0535 04/12/13 0518  NA 139 141  K 4.3 4.6  CL 104 105  CO2 24 23  GLUCOSE 103* 102*  BUN 9 11  CREATININE 0.55 0.64  CALCIUM 8.7 8.8    Studies/Results: No results found.  Medications: I have reviewed the patient's current medications.  Assessment/Plan: A 33 yo man admitted  with Sickle cell painful crisis.  #1 Sickle cell painful Crisis: Patient is responding to his PCA and using his breakthrough dose consistently. He has walked around and has been feeling better.  We will hold his breakthrough dose and continue the PCa in preparation for DC.  #2 Sickle Cell anemia: H/H has been stable.   #3 Hx of PE: Continue Xarelto.  #4 AVN: Stable. Continue medications.  #5 Mood Disorder: Stable.    LOS: 5 days   [name],[name] 04/12/2013, 2:13 PM

## 2013-04-12 NOTE — Discharge Summary (Signed)
Physician Discharge Summary  Patient ID: [name] MRN: [id number] DOB/AGE: [birth year] 33 y.o.  Admit date: 04/07/2013 Discharge date: 04/12/2013  Admission Diagnoses:  Discharge Diagnoses:  Principal Problem:   Chronic anticoagulation Active Problems:   Avascular necrosis   Mood disorder   Hypertension   Sickle cell hemolytic anemia   Hx of pulmonary embolus   Leukocytosis, unspecified   Low back pain   Sickle cell pain crisis   Vaso-occlusive sickle cell crisis   Discharged Condition: good  Hospital Course: Patient admitted with acute chest syndrome as well as sickle cell painful crisis. Was in the ICU with Oxygen, ABx as well as CCM involvement. Patient did well and has improved. He was titrated off Oxygen prior to discharge. He was also on Dilaudid PCA at the time. Repeat CXR shows no pneumonia. Patient was then discharged home on Levaaquin to treat for 10 days total. He will resume his Pain medications. Prescriptions were given for all his medications at the time of discharge. He will follow up with Dr Zigmund Daniel on 05/15/2013.  Consults: pulmonary/intensive care  Significant Diagnostic Studies: labs: Multiple, serial CBCs and CMPs checked mostly stable. Chest  Treatments: IV hydration, antibiotics: vancomycin, Zosyn and Levaquin and analgesia: Dilaudid  Discharge Exam: Blood pressure 125/87, pulse 67, temperature 98.8 F (37.1 C), temperature source Oral, resp. rate 14, height 6' (1.829 m), weight 72.4 kg (159 lb 9.8 oz), SpO2 96.00%. General appearance: alert, cooperative and no distress Eyes: conjunctivae/corneas clear. PERRL, EOM's intact. Fundi benign. Neck: no adenopathy, no carotid bruit, no JVD, supple, symmetrical, trachea midline and thyroid not enlarged, symmetric, no tenderness/mass/nodules Back: symmetric, no curvature. ROM normal. No CVA tenderness. Resp: clear to auscultation bilaterally Chest wall: no tenderness Cardio: regular rate and rhythm, S1, S2  normal, no murmur, click, rub or gallop GI: soft, non-tender; bowel sounds normal; no masses,  no organomegaly Extremities: extremities normal, atraumatic, no cyanosis or edema Pulses: 2+ and symmetric Skin: Skin color, texture, turgor normal. No rashes or lesions Neurologic: Grossly normal  Disposition: 01-Home or Self Care       Future Appointments Provider Department Dept Phone   05/15/2013 10:00 AM Dorena Dew, [place] [phone]       Medication List    ASK your doctor about these medications       celecoxib 200 MG capsule  Commonly known as:  CELEBREX  Take 1 capsule (200 mg total) by mouth 2 (two) times daily.     doxycycline 100 MG tablet  Commonly known as:  VIBRA-TABS  Take 100 mg by mouth daily.     folic acid 1 MG tablet  Commonly known as:  FOLVITE  Take 1 tablet (1 mg total) by mouth every morning.     HYDROmorphone 4 MG tablet  Commonly known as:  DILAUDID  Take 1 tablet (4 mg total) by mouth every 4 (four) hours as needed for severe pain.     hydroxyurea 500 MG capsule  Commonly known as:  HYDREA  Take 1 capsule (500 mg total) by mouth 2 (two) times daily with a meal. May take with food to minimize GI side effects.     morphine 15 MG 12 hr tablet  Commonly known as:  MS CONTIN  Take 1 tablet (15 mg total) by mouth 2 (two) times daily.     potassium chloride SA 20 MEQ tablet  Commonly known as:  K-DUR,KLOR-CON  Take 1 tablet (20 mEq total) by mouth every  morning.     XARELTO 20 MG Tabs tablet  Generic drug:  Rivaroxaban  Take 20 mg by mouth daily with supper.     zolpidem 10 MG tablet  Commonly known as:  AMBIEN  Take 1 tablet (10 mg total) by mouth at bedtime as needed for sleep.         SignedBarbette Merino 04/12/2013, 2:13 PM  Time spent: 36 minutes

## 2013-04-13 DIAGNOSIS — M8700 Idiopathic aseptic necrosis of unspecified bone: Secondary | ICD-10-CM | POA: Diagnosis not present

## 2013-04-13 DIAGNOSIS — R17 Unspecified jaundice: Secondary | ICD-10-CM | POA: Diagnosis not present

## 2013-04-13 DIAGNOSIS — I2782 Chronic pulmonary embolism: Secondary | ICD-10-CM | POA: Diagnosis not present

## 2013-04-13 DIAGNOSIS — D5700 Hb-SS disease with crisis, unspecified: Secondary | ICD-10-CM | POA: Diagnosis not present

## 2013-04-13 DIAGNOSIS — E876 Hypokalemia: Secondary | ICD-10-CM | POA: Diagnosis not present

## 2013-04-13 DIAGNOSIS — I82C29 Chronic embolism and thrombosis of unspecified internal jugular vein: Secondary | ICD-10-CM | POA: Diagnosis not present

## 2013-04-13 LAB — CBC WITH DIFFERENTIAL/PLATELET
Basophils Absolute: 0.2 10*3/uL — ABNORMAL HIGH (ref 0.0–0.1)
Basophils Relative: 1 % (ref 0–1)
Eosinophils Absolute: 0.9 10*3/uL — ABNORMAL HIGH (ref 0.0–0.7)
Eosinophils Relative: 5 % (ref 0–5)
HCT: 21 % — ABNORMAL LOW (ref 39.0–52.0)
HEMOGLOBIN: 7 g/dL — AB (ref 13.0–17.0)
Lymphocytes Relative: 15 % (ref 12–46)
Lymphs Abs: 2.8 10*3/uL (ref 0.7–4.0)
MCH: 30.3 pg (ref 26.0–34.0)
MCHC: 33.3 g/dL (ref 30.0–36.0)
MCV: 90.9 fL (ref 78.0–100.0)
MONOS PCT: 14 % — AB (ref 3–12)
Monocytes Absolute: 2.6 10*3/uL — ABNORMAL HIGH (ref 0.1–1.0)
Neutro Abs: 12.1 10*3/uL — ABNORMAL HIGH (ref 1.7–7.7)
Neutrophils Relative %: 65 % (ref 43–77)
Platelets: 626 10*3/uL — ABNORMAL HIGH (ref 150–400)
RBC: 2.31 MIL/uL — AB (ref 4.22–5.81)
RDW: 19.4 % — ABNORMAL HIGH (ref 11.5–15.5)
WBC: 18.6 10*3/uL — ABNORMAL HIGH (ref 4.0–10.5)

## 2013-04-13 MED ORDER — HEPARIN SOD (PORK) LOCK FLUSH 100 UNIT/ML IV SOLN: 500.0000 [IU] | INTRAVENOUS | Status: DC | PRN

## 2013-04-13 NOTE — Progress Notes (Signed)
Patient discharged home, all discharge medications and instructions reviewed and questions answered.  Patient refuses wheelchair assistance to vehicle, states will ambulate.  

## 2013-04-21 ENCOUNTER — Emergency Department (HOSPITAL_COMMUNITY)
Admission: EM | Admit: 2013-04-21 | Discharge: 2013-04-21 | Disposition: A | Discharge status: Home / Self Care | Payer: Medicare Other | Attending: Emergency Medicine | Admitting: Emergency Medicine

## 2013-04-21 ENCOUNTER — Encounter (HOSPITAL_COMMUNITY): Payer: Self-pay | Admitting: Emergency Medicine

## 2013-04-21 DIAGNOSIS — D5700 Hb-SS disease with crisis, unspecified: Secondary | ICD-10-CM | POA: Insufficient documentation

## 2013-04-21 DIAGNOSIS — Z86718 Personal history of other venous thrombosis and embolism: Secondary | ICD-10-CM | POA: Insufficient documentation

## 2013-04-21 DIAGNOSIS — Z87891 Personal history of nicotine dependence: Secondary | ICD-10-CM | POA: Insufficient documentation

## 2013-04-21 DIAGNOSIS — Z79899 Other long term (current) drug therapy: Secondary | ICD-10-CM | POA: Diagnosis not present

## 2013-04-21 DIAGNOSIS — Z7901 Long term (current) use of anticoagulants: Secondary | ICD-10-CM | POA: Diagnosis not present

## 2013-04-21 DIAGNOSIS — I10 Essential (primary) hypertension: Secondary | ICD-10-CM | POA: Insufficient documentation

## 2013-04-21 LAB — CBC WITH DIFFERENTIAL/PLATELET
Basophils Absolute: 0.1 10*3/uL (ref 0.0–0.1)
Basophils Relative: 1 % (ref 0–1)
EOS ABS: 0.5 10*3/uL (ref 0.0–0.7)
Eosinophils Relative: 2 % (ref 0–5)
HCT: 23.4 % — ABNORMAL LOW (ref 39.0–52.0)
HEMOGLOBIN: 8 g/dL — AB (ref 13.0–17.0)
Lymphocytes Relative: 12 % (ref 12–46)
Lymphs Abs: 2.3 10*3/uL (ref 0.7–4.0)
MCH: 31.6 pg (ref 26.0–34.0)
MCHC: 34.2 g/dL (ref 30.0–36.0)
MCV: 92.5 fL (ref 78.0–100.0)
MONOS PCT: 12 % (ref 3–12)
Monocytes Absolute: 2.3 10*3/uL — ABNORMAL HIGH (ref 0.1–1.0)
NEUTROS ABS: 14.2 10*3/uL — AB (ref 1.7–7.7)
Neutrophils Relative %: 74 % (ref 43–77)
Platelets: 496 10*3/uL — ABNORMAL HIGH (ref 150–400)
RBC: 2.53 MIL/uL — AB (ref 4.22–5.81)
RDW: 19.1 % — ABNORMAL HIGH (ref 11.5–15.5)
WBC: 19.3 10*3/uL — ABNORMAL HIGH (ref 4.0–10.5)

## 2013-04-21 LAB — RETICULOCYTES
RBC.: 2.53 MIL/uL — ABNORMAL LOW (ref 4.22–5.81)
RETIC CT PCT: 16.4 % — AB (ref 0.4–3.1)
Retic Count, Absolute: 414.9 10*3/uL — ABNORMAL HIGH (ref 19.0–186.0)

## 2013-04-21 LAB — COMPREHENSIVE METABOLIC PANEL
ALK PHOS: 87 U/L (ref 39–117)
ALT: 9 U/L (ref 0–53)
AST: 28 U/L (ref 0–37)
Albumin: 3.9 g/dL (ref 3.5–5.2)
BILIRUBIN TOTAL: 4.7 mg/dL — AB (ref 0.3–1.2)
BUN: 9 mg/dL (ref 6–23)
CHLORIDE: 105 meq/L (ref 96–112)
CO2: 22 mEq/L (ref 19–32)
Calcium: 8.9 mg/dL (ref 8.4–10.5)
Creatinine, Ser: 0.59 mg/dL (ref 0.50–1.35)
GFR calc non Af Amer: >90 mL/min (ref >90)
GLUCOSE: 108 mg/dL — AB (ref 70–99)
POTASSIUM: 3.5 meq/L — AB (ref 3.7–5.3)
Sodium: 141 mEq/L (ref 137–147)
Total Protein: 7.5 g/dL (ref 6.0–8.3)

## 2013-04-21 MED ORDER — HYDROMORPHONE HCL 2 MG PO TABS
4.0000 mg | ORAL_TABLET | Freq: Once | ORAL | Status: AC
  Administered 2013-04-21: 4 mg via ORAL
  Filled 2013-04-21: qty 2

## 2013-04-21 MED ORDER — HYDROMORPHONE HCL PF 2 MG/ML IJ SOLN
2.0000 mg | Freq: Once | INTRAMUSCULAR | Status: AC
Start: 2013-04-21 — End: 2013-04-21
  Administered 2013-04-21: 2 mg via INTRAVENOUS
  Filled 2013-04-21: qty 1

## 2013-04-21 MED ORDER — KETOROLAC TROMETHAMINE 30 MG/ML IJ SOLN
30.0000 mg | Freq: Once | INTRAMUSCULAR | Status: AC
  Administered 2013-04-21: 30 mg via INTRAVENOUS
  Filled 2013-04-21: qty 1

## 2013-04-21 MED ORDER — SODIUM CHLORIDE 0.9 % IV SOLN
INTRAVENOUS | Status: DC
  Administered 2013-04-21: 10:00:00 via INTRAVENOUS

## 2013-04-21 MED ORDER — HYDROMORPHONE HCL PF 1 MG/ML IJ SOLN
1.0000 mg | Freq: Once | INTRAMUSCULAR | Status: AC
  Administered 2013-04-21: 1 mg via INTRAVENOUS
  Filled 2013-04-21: qty 1

## 2013-04-21 MED ORDER — HEPARIN SOD (PORK) LOCK FLUSH 100 UNIT/ML IV SOLN
500.0000 [IU] | Freq: Once | INTRAVENOUS | Status: AC
  Administered 2013-04-21: 500 [IU]
  Filled 2013-04-21: qty 5

## 2013-04-21 NOTE — ED Notes (Signed)
He c/o sickle cell pain in low back and lower ribcage since Fri.

## 2013-04-21 NOTE — ED Provider Notes (Signed)
CSN: [id number]     Arrival date & time 04/21/13  0808 History   First MD Initiated Contact with Patient 04/21/13 [phone]     Chief Complaint  Patient presents with  . Sickle Cell Pain Crisis     (Consider location/radiation/quality/duration/timing/severity/associated sxs/prior Treatment) HPI  Patient presents to the ED with complaints of sickle cell crisis. He is well known to the ER for crisis and is followed by Dr. Zigmund Daniel and the [facility]. He describes his pain as in his back same as his normal crisis. He has dilaudid and MS contin at home which he says does not help. He says that he thinks he will be able to go home today after a few rounds of medications. He has not had cough, fever, CP, nausea, vomiting or diarrhea. His temp, pulse and other vital signs are WNL.  Past Medical History  Diagnosis Date  . Sickle cell anemia   . Blood transfusion   . 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 Hypokalemia   . Mood disorder   . Pulmonary embolism   . Avascular necrosis   . Leukocytosis     Chronic  . Thrombocytosis     Chronic  . Hypertension   . C. difficile colitis   . Uses marijuana   . Chronic anticoagulation   . Functional asplenia    Past Surgical History  Procedure Laterality Date  . Right hip replacement      08/2006  . Cholecystectomy      01/2008  . Porta cath placement    . Porta cath removal    . Umbilical hernia repair      01/2008  . Excision of left periauricular cyst      10/2009  . Excision of right ear lobe cyst with primary closur      11/2007  . Portacath placement  01/05/2012    Procedure: INSERTION PORT-A-CATH;  Surgeon: Odis Hollingshead, MD;  Location: [place];  Service: General;  Laterality: N/A;  ultrasound guiced port a cath insertion with fluoroscopy   Family History  Problem Relation Age of Onset  . Sickle cell anemia Mother   . Sickle cell anemia Father   . Sickle cell trait Brother    History  Substance Use Topics  .  Smoking status: Former Smoker -- 13 years    Types: Cigarettes    Quit date: 07/08/2010  . Smokeless tobacco: Never Used  . Alcohol Use: 6.0 oz/week    10 Shots of liquor per week    Review of Systems  The patient denies anorexia, fever, weight loss,, vision loss, decreased hearing, hoarseness, chest pain, syncope, dyspnea on exertion, peripheral edema, balance deficits, hemoptysis, abdominal pain, melena, hematochezia, severe indigestion/heartburn, hematuria, incontinence, genital sores, muscle weakness, suspicious skin lesions, transient blindness, difficulty walking, depression, unusual weight change, abnormal bleeding, enlarged lymph nodes, angioedema, and breast masses.   Allergies  Morphine and related  Home Medications   Current Outpatient Rx  Name  Route  Sig  Dispense  Refill  . celecoxib (CELEBREX) 200 MG capsule   Oral   Take 1 capsule (200 mg total) by mouth 2 (two) times daily.   60 capsule   1   . folic acid (FOLVITE) 1 MG tablet   Oral   Take 1 tablet (1 mg total) by mouth every morning.   30 tablet   11   . HYDROmorphone (DILAUDID) 4 MG tablet   Oral   Take 1  tablet (4 mg total) by mouth every 4 (four) hours as needed for severe pain.   90 tablet   0   . hydroxyurea (HYDREA) 500 MG capsule   Oral   Take 1 capsule (500 mg total) by mouth 2 (two) times daily with a meal. May take with food to minimize GI side effects.   60 capsule   0   . morphine (MS CONTIN) 15 MG 12 hr tablet   Oral   Take 1 tablet (15 mg total) by mouth 2 (two) times daily.   60 tablet   0   . potassium chloride SA (K-DUR,KLOR-CON) 20 MEQ tablet   Oral   Take 1 tablet (20 mEq total) by mouth every morning.   30 tablet   3   . Rivaroxaban (XARELTO) 20 MG TABS tablet   Oral   Take 20 mg by mouth daily with supper.         . zolpidem (AMBIEN) 10 MG tablet   Oral   Take 1 tablet (10 mg total) by mouth at bedtime as needed for sleep.   30 tablet   2    BP 126/78  Pulse  72  Temp(Src) 98.7 F (37.1 C) (Oral)  Resp 19  SpO2 94% Physical Exam  Nursing note and vitals reviewed. Constitutional: He appears well-developed and well-nourished. No distress.  HENT:  Head: Normocephalic and atraumatic.  Eyes: Pupils are equal, round, and reactive to light.  Neck: Normal range of motion. Neck supple.  Cardiovascular: Normal rate and regular rhythm.   Pulmonary/Chest: Effort normal and breath sounds normal. No respiratory distress. He has no wheezes. He has no rales. He exhibits no tenderness.  Abdominal: Soft.  Neurological: He is alert.  Skin: Skin is warm and dry.    ED Course  Procedures (including critical care time) Labs Review Labs Reviewed  CBC WITH DIFFERENTIAL - Abnormal; Notable for the following:    WBC 19.3 (*)    RBC 2.53 (*)    Hemoglobin 8.0 (*)    HCT 23.4 (*)    RDW 19.1 (*)    Platelets 496 (*)    Neutro Abs 14.2 (*)    Monocytes Absolute 2.3 (*)    All other components within normal limits  COMPREHENSIVE METABOLIC PANEL - Abnormal; Notable for the following:    Potassium 3.5 (*)    Glucose, Bld 108 (*)    Total Bilirubin 4.7 (*)    All other components within normal limits  RETICULOCYTES - Abnormal; Notable for the following:    Retic Ct Pct 16.4 (*)    RBC. 2.53 (*)    Retic Count, Manual 414.9 (*)    All other components within normal limits   Imaging Review No results found.   EKG Interpretation None      MDM   Final diagnoses:  Sickle cell crisis    Patient given po 4 mg Dilaudid and Zofran. Pt states it did not help Patient then given another round of Dilaudid IV this time and it helped the patients pain significantly. At this time he has decided that he would rather get go home then get admitted. His vitals continue to be non impressive and I do not see any signs of distress. His lab work-up is at his baseline.  Pt to continue home meds and can f/u with sickle cell clinic tomorrow if needed.  33 y.o.Dena Billet Sentell's evaluation in the Emergency Department is complete. It has been determined that no  acute conditions requiring further emergency intervention are present at this time. The patient/guardian have been advised of the diagnosis and plan. We have discussed signs and symptoms that warrant return to the ED, such as changes or worsening in symptoms.  Vital signs are stable at discharge. Filed Vitals:   04/21/13 1259  BP: 126/78  Pulse: 72  Temp: 98.7 F (37.1 C)  Resp: 19    Patient/guardian has voiced understanding and agreed to follow-up with the PCP or specialist.     Linus Mako, PA-C 04/21/13 1438

## 2013-04-21 NOTE — Discharge Instructions (Signed)
Sickle Cell Anemia, Adult °Sickle cell anemia is a condition in which red blood cells have an abnormal "sickle" shape. This abnormal shape shortens the cells' life span, which results in a lower than normal concentration of red blood cells in the blood. The sickle shape also causes the cells to clump together and block free blood flow through the blood vessels. As a result, the tissues and organs of the body do not receive enough oxygen. Sickle cell anemia causes organ damage and pain and increases the risk of infection. °CAUSES  °Sickle cell anemia is a genetic disorder. Those who receive two copies of the gene have the condition, and those who receive one copy have the trait. °RISK FACTORS °The sickle cell gene is most common in people whose families originated in [place]. Other areas of the globe where sickle cell trait occurs include the Mediterranean, [place] and [place], the [place], and the [place].  °SIGNS AND SYMPTOMS °· Pain, especially in the extremities, back, chest, or abdomen (common). The pain may start suddenly or may develop following an illness, especially if there is dehydration. Pain can also occur due to overexertion or exposure to extreme temperature changes. °· Frequent severe bacterial infections, especially certain types of pneumonia and meningitis. °· Pain and swelling in the hands and feet. °· Decreased activity.   °· Loss of appetite.   °· Change in behavior. °· Headaches. °· Seizures. °· Shortness of breath or difficulty breathing. °· Vision changes. °· Skin ulcers. °Those with the trait may not have symptoms or they may have mild symptoms.  °DIAGNOSIS  °Sickle cell anemia is diagnosed with blood tests that demonstrate the genetic trait. It is often diagnosed during the newborn period, due to mandatory testing nationwide. A variety of blood tests, X-rays, CT scans, MRI scans, ultrasounds, and lung function tests may also be done to monitor the condition. °TREATMENT  °Sickle  cell anemia may be treated with: °· Medicines. You may be given pain medicines, antibiotic medicines (to treat and prevent infections) or medicines to increase the production of certain types of hemoglobin. °· Fluids. °· Oxygen. °· Blood transfusions. °HOME CARE INSTRUCTIONS  °· Drink enough fluid to keep your urine clear or pale yellow. Increase your fluid intake in hot weather and during exercise. °· Do not smoke. Smoking lowers oxygen levels in the blood.   °· Only take over-the-counter or prescription medicines for pain, fever, or discomfort as directed by your health care provider. °· Take antibiotics as directed by your health care provider. Make sure you finish them it even if you start to feel better.   °· Take supplements as directed by your health care provider.   °· Consider wearing a medical alert bracelet. This tells anyone caring for you in an emergency of your condition.   °· When traveling, keep your medical information, health care provider's names, and the medicines you take with you at all times.   °· If you develop a fever, do not take medicines to reduce the fever right away. This could cover up a problem that is developing. Notify your health care provider. °· Keep all follow-up appointments with your health care provider. Sickle cell anemia requires regular medical care. °SEEK MEDICAL CARE IF: ° You have a fever. °SEEK IMMEDIATE MEDICAL CARE IF:  °· You feel dizzy or faint.   °· You have new abdominal pain, especially on the left side near the stomach area.   °· You develop a persistent, often uncomfortable and painful penile erection (priapism). If this is not treated immediately it   will lead to impotence.   °· You have numbness your arms or legs or you have a hard time moving them.   °· You have a hard time with speech.   °· You have a fever or persistent symptoms for more than 2 3 days.   °· You have a fever and your symptoms suddenly get worse.   °· You have signs or symptoms of infection.  These include:   °· Chills.   °· Abnormal tiredness (lethargy).   °· Irritability.   °· Poor eating.   °· Vomiting.   °· You develop pain that is not helped with medicine.   °· You develop shortness of breath. °· You have pain in your chest.   °· You are coughing up pus-like or bloody sputum.   °· You develop a stiff neck. °· Your feet or hands swell or have pain. °· Your abdomen appears bloated. °· You develop joint pain. °MAKE SURE YOU: °· Understand these instructions. °· Will watch your child's condition. °· Will get help right away if your child is not doing well or gets worse. °Document Released: 04/27/2005 Document Revised: 11/07/2012 Document Reviewed: 08/29/2012 °[employer]® Patient Information ©2014 [employer]. ° °

## 2013-04-22 ENCOUNTER — Non-Acute Institutional Stay (HOSPITAL_COMMUNITY)
Admission: AD | Admit: 2013-04-22 | Discharge: 2013-04-22 | Disposition: A | Discharge status: Home / Self Care | Payer: Medicare Other | Attending: Family Medicine | Admitting: Family Medicine

## 2013-04-22 ENCOUNTER — Encounter (HOSPITAL_COMMUNITY): Payer: Self-pay | Admitting: Hematology

## 2013-04-22 DIAGNOSIS — E559 Vitamin D deficiency, unspecified: Secondary | ICD-10-CM | POA: Diagnosis present

## 2013-04-22 DIAGNOSIS — Z86718 Personal history of other venous thrombosis and embolism: Secondary | ICD-10-CM | POA: Insufficient documentation

## 2013-04-22 DIAGNOSIS — F1210 Cannabis abuse, uncomplicated: Secondary | ICD-10-CM | POA: Insufficient documentation

## 2013-04-22 DIAGNOSIS — D5700 Hb-SS disease with crisis, unspecified: Secondary | ICD-10-CM | POA: Diagnosis not present

## 2013-04-22 DIAGNOSIS — E876 Hypokalemia: Secondary | ICD-10-CM | POA: Diagnosis present

## 2013-04-22 DIAGNOSIS — D72829 Elevated white blood cell count, unspecified: Secondary | ICD-10-CM

## 2013-04-22 DIAGNOSIS — Z7901 Long term (current) use of anticoagulants: Secondary | ICD-10-CM | POA: Diagnosis not present

## 2013-04-22 DIAGNOSIS — I10 Essential (primary) hypertension: Secondary | ICD-10-CM | POA: Insufficient documentation

## 2013-04-22 DIAGNOSIS — Z96649 Presence of unspecified artificial hip joint: Secondary | ICD-10-CM | POA: Insufficient documentation

## 2013-04-22 DIAGNOSIS — R0789 Other chest pain: Secondary | ICD-10-CM | POA: Diagnosis present

## 2013-04-22 DIAGNOSIS — D473 Essential (hemorrhagic) thrombocythemia: Secondary | ICD-10-CM | POA: Insufficient documentation

## 2013-04-22 DIAGNOSIS — R0781 Pleurodynia: Secondary | ICD-10-CM | POA: Diagnosis present

## 2013-04-22 DIAGNOSIS — Z79899 Other long term (current) drug therapy: Secondary | ICD-10-CM | POA: Insufficient documentation

## 2013-04-22 DIAGNOSIS — R17 Unspecified jaundice: Secondary | ICD-10-CM | POA: Insufficient documentation

## 2013-04-22 DIAGNOSIS — E860 Dehydration: Secondary | ICD-10-CM | POA: Diagnosis present

## 2013-04-22 DIAGNOSIS — Z9089 Acquired absence of other organs: Secondary | ICD-10-CM | POA: Insufficient documentation

## 2013-04-22 DIAGNOSIS — R079 Chest pain, unspecified: Secondary | ICD-10-CM | POA: Insufficient documentation

## 2013-04-22 DIAGNOSIS — Z86711 Personal history of pulmonary embolism: Secondary | ICD-10-CM | POA: Diagnosis present

## 2013-04-22 DIAGNOSIS — F1290 Cannabis use, unspecified, uncomplicated: Secondary | ICD-10-CM | POA: Diagnosis present

## 2013-04-22 DIAGNOSIS — Z87891 Personal history of nicotine dependence: Secondary | ICD-10-CM | POA: Insufficient documentation

## 2013-04-22 DIAGNOSIS — D75839 Thrombocytosis, unspecified: Secondary | ICD-10-CM | POA: Diagnosis present

## 2013-04-22 LAB — RAPID URINE DRUG SCREEN, HOSP PERFORMED
Amphetamines: NOT DETECTED
Barbiturates: NOT DETECTED
Benzodiazepines: NOT DETECTED
Cocaine: NOT DETECTED
OPIATES: POSITIVE — AB
Tetrahydrocannabinol: POSITIVE — AB

## 2013-04-22 LAB — COMPREHENSIVE METABOLIC PANEL
ALBUMIN: 3.8 g/dL (ref 3.5–5.2)
ALK PHOS: 90 U/L (ref 39–117)
ALT: 17 U/L (ref 0–53)
AST: 29 U/L (ref 0–37)
BUN: 8 mg/dL (ref 6–23)
CO2: 20 meq/L (ref 19–32)
Calcium: 8.9 mg/dL (ref 8.4–10.5)
Chloride: 103 mEq/L (ref 96–112)
Creatinine, Ser: 0.59 mg/dL (ref 0.50–1.35)
GFR calc non Af Amer: >90 mL/min (ref >90)
GLUCOSE: 102 mg/dL — AB (ref 70–99)
POTASSIUM: 3.2 meq/L — AB (ref 3.7–5.3)
Sodium: 137 mEq/L (ref 137–147)
Total Bilirubin: 5.5 mg/dL — ABNORMAL HIGH (ref 0.3–1.2)
Total Protein: 7.2 g/dL (ref 6.0–8.3)

## 2013-04-22 LAB — MAGNESIUM: MAGNESIUM: 1.6 mg/dL (ref 1.5–2.5)

## 2013-04-22 MED ORDER — SODIUM CHLORIDE 0.9 % IJ SOLN
10.0000 mL | INTRAMUSCULAR | Status: AC | PRN
  Administered 2013-04-22: 10 mL

## 2013-04-22 MED ORDER — HEPARIN SOD (PORK) LOCK FLUSH 100 UNIT/ML IV SOLN
500.0000 [IU] | INTRAVENOUS | Status: AC | PRN
  Administered 2013-04-22: 500 [IU]
  Filled 2013-04-22: qty 5

## 2013-04-22 MED ORDER — HYDROXYUREA 500 MG PO CAPS
500.0000 mg | ORAL_CAPSULE | Freq: Two times a day (BID) | ORAL | Status: DC
  Administered 2013-04-22: 500 mg via ORAL
  Filled 2013-04-22: qty 1

## 2013-04-22 MED ORDER — HYDROMORPHONE HCL PF 2 MG/ML IJ SOLN
2.0000 mg | Freq: Once | INTRAMUSCULAR | Status: AC
  Administered 2013-04-22: 2 mg via INTRAVENOUS
  Filled 2013-04-22: qty 1

## 2013-04-22 MED ORDER — PANTOPRAZOLE SODIUM 40 MG PO TBEC
40.0000 mg | DELAYED_RELEASE_TABLET | Freq: Every day | ORAL | Status: AC
Start: 2013-04-22 — End: 2013-04-22
  Administered 2013-04-22: 40 mg via ORAL
  Filled 2013-04-22: qty 1

## 2013-04-22 MED ORDER — MORPHINE SULFATE ER 15 MG PO TBCR: 15.0000 mg | EXTENDED_RELEASE_TABLET | Freq: Two times a day (BID) | ORAL | Status: DC

## 2013-04-22 MED ORDER — ONDANSETRON HCL 4 MG/2ML IJ SOLN: 4.0000 mg | Freq: Four times a day (QID) | INTRAMUSCULAR | Status: DC | PRN

## 2013-04-22 MED ORDER — ERGOCALCIFEROL 1.25 MG (50000 UT) PO CAPS: 50000.0000 [IU] | ORAL_CAPSULE | ORAL | Status: DC

## 2013-04-22 MED ORDER — NALOXONE HCL 0.4 MG/ML IJ SOLN: 0.4000 mg | INTRAMUSCULAR | Status: DC | PRN

## 2013-04-22 MED ORDER — MAGNESIUM SULFATE 40 MG/ML IJ SOLN
2.0000 g | Freq: Once | INTRAMUSCULAR | Status: AC
  Administered 2013-04-22: 2 g via INTRAVENOUS
  Filled 2013-04-22: qty 50

## 2013-04-22 MED ORDER — HYDROMORPHONE 0.3 MG/ML IV SOLN
INTRAVENOUS | Status: DC
  Administered 2013-04-22: 11:00:00 via INTRAVENOUS
  Administered 2013-04-22: 71.86 mL via INTRAVENOUS
  Administered 2013-04-22 (×2): via INTRAVENOUS
  Filled 2013-04-22 (×4): qty 25

## 2013-04-22 MED ORDER — KCL IN DEXTROSE-NACL 20-5-0.45 MEQ/L-%-% IV SOLN
INTRAVENOUS | Status: DC
  Administered 2013-04-22: 09:00:00 via INTRAVENOUS
  Filled 2013-04-22 (×2): qty 1000

## 2013-04-22 MED ORDER — ONDANSETRON HCL 4 MG/2ML IJ SOLN: 4.0000 mg | INTRAMUSCULAR | Status: DC | PRN

## 2013-04-22 MED ORDER — POTASSIUM CHLORIDE CRYS ER 20 MEQ PO TBCR
40.0000 meq | EXTENDED_RELEASE_TABLET | Freq: Every morning | ORAL | Status: AC
  Administered 2013-04-22: 40 meq via ORAL
  Filled 2013-04-22: qty 2

## 2013-04-22 MED ORDER — VITAMIN D (ERGOCALCIFEROL) 1.25 MG (50000 UNIT) PO CAPS
50000.0000 [IU] | ORAL_CAPSULE | Freq: Once | ORAL | Status: AC
  Administered 2013-04-22: 50000 [IU] via ORAL
  Filled 2013-04-22: qty 1

## 2013-04-22 MED ORDER — FOLIC ACID 1 MG PO TABS
1.0000 mg | ORAL_TABLET | Freq: Every morning | ORAL | Status: DC
  Administered 2013-04-22: 1 mg via ORAL
  Filled 2013-04-22: qty 1

## 2013-04-22 MED ORDER — KETOROLAC TROMETHAMINE 15 MG/ML IJ SOLN
15.0000 mg | Freq: Four times a day (QID) | INTRAMUSCULAR | Status: DC
  Administered 2013-04-22 (×2): 15 mg via INTRAVENOUS
  Filled 2013-04-22 (×4): qty 1

## 2013-04-22 MED ORDER — ONDANSETRON HCL 4 MG PO TABS: 4.0000 mg | ORAL_TABLET | ORAL | Status: DC | PRN

## 2013-04-22 MED ORDER — HYDROMORPHONE HCL PF 2 MG/ML IJ SOLN
3.3000 mg | Freq: Once | INTRAMUSCULAR | Status: AC | PRN
  Administered 2013-04-22: 3.3 mg via INTRAVENOUS
  Filled 2013-04-22: qty 2

## 2013-04-22 MED ORDER — HYDROMORPHONE HCL 4 MG PO TABS
4.0000 mg | ORAL_TABLET | Freq: Once | ORAL | Status: AC
  Administered 2013-04-22: 4 mg via ORAL
  Filled 2013-04-22: qty 1

## 2013-04-22 MED ORDER — HYDROMORPHONE HCL PF 2 MG/ML IJ SOLN
2.5000 mg | Freq: Once | INTRAMUSCULAR | Status: AC | PRN
  Administered 2013-04-22: 2.5 mg via INTRAVENOUS
  Filled 2013-04-22: qty 2

## 2013-04-22 MED ORDER — SODIUM CHLORIDE 0.9 % IJ SOLN: 9.0000 mL | INTRAMUSCULAR | Status: DC | PRN

## 2013-04-22 MED ORDER — HYDROXYZINE HCL 25 MG PO TABS
25.0000 mg | ORAL_TABLET | ORAL | Status: DC | PRN
  Administered 2013-04-22: 25 mg via ORAL
  Filled 2013-04-22: qty 1

## 2013-04-22 NOTE — ED Provider Notes (Signed)
Medical screening examination/treatment/procedure(s) were performed by non-physician practitioner and as supervising physician I was immediately available for consultation/collaboration.   EKG Interpretation None        Houston Siren III, MD 04/22/13 [phone]

## 2013-04-22 NOTE — Progress Notes (Signed)
Patient ID: [name], male   DOB: 1980-03-06, 33 y.o.   MRN: [id number] Pt signed discharge papers and left ambulating. Pt was not in any acute distress.

## 2013-04-22 NOTE — Discharge Instructions (Signed)
Anemia, Nonspecific Anemia is a condition in which the concentration of red blood cells or hemoglobin in the blood is below normal. Hemoglobin is a substance in red blood cells that carries oxygen to the tissues of the body. Anemia results in not enough oxygen reaching these tissues.  CAUSES  Common causes of anemia include:   Excessive bleeding. Bleeding may be internal or external. This includes excessive bleeding from periods (in women) or from the intestine.   Poor nutrition.   Chronic kidney, thyroid, and liver disease.  Bone marrow disorders that decrease red blood cell production.  Cancer and treatments for cancer.  HIV, AIDS, and their treatments.  Spleen problems that increase red blood cell destruction.  Blood disorders.  Excess destruction of red blood cells due to infection, medicines, and autoimmune disorders. SIGNS AND SYMPTOMS   Minor weakness.   Dizziness.   Headache.  Palpitations.   Shortness of breath, especially with exercise.   Paleness.  Cold sensitivity.  Indigestion.  Nausea.  Difficulty sleeping.  Difficulty concentrating. Symptoms may occur suddenly or they may develop slowly.  DIAGNOSIS  Additional blood tests are often needed. These help your health care provider determine the best treatment. Your health care provider will check your stool for blood and look for other causes of blood loss.  TREATMENT  Treatment varies depending on the cause of the anemia. Treatment can include:   Supplements of iron, vitamin B12, or folic acid.   Hormone medicines.   A blood transfusion. This may be needed if blood loss is severe.   Hospitalization. This may be needed if there is significant continual blood loss.   Dietary changes.  Spleen removal. HOME CARE INSTRUCTIONS Keep all follow-up appointments. It often takes many weeks to correct anemia, and having your health care provider check on your condition and your response to  treatment is very important. SEEK IMMEDIATE MEDICAL CARE IF:   You develop extreme weakness, shortness of breath, or chest pain.   You become dizzy or have trouble concentrating.  You develop heavy vaginal bleeding.   You develop a rash.   You have bloody or black, tarry stools.   You faint.   You vomit up blood.   You vomit repeatedly.   You have abdominal pain.  You have a fever or persistent symptoms for more than 2 3 days.   You have a fever and your symptoms suddenly get worse.   You are dehydrated.  MAKE SURE YOU:  Understand these instructions.  Will watch your condition.  Will get help right away if you are not doing well or get worse. Document Released: 02/25/2004 Document Revised: 09/19/2012 Document Reviewed: 07/13/2012 [employer] Patient Information 2014 [employer].  

## 2013-04-22 NOTE — Progress Notes (Signed)
Patient ID: [name], male   DOB: 1980-02-11, 33 y.o.   MRN: [id number] Pt discharged home after a full day's treatment. Pt's pain down to a 6/10, pt sleepy, awaiting ride.  Pt's port deaccessed as per protocol, pt tolerated well.

## 2013-04-22 NOTE — H&P (Signed)
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  He had a bout of acute chest syndrome a couple of months ago and multiple recent hospitalizations.  He was seen in the ER yesterday and treated with IV dilaudid and was able to go home.  He reports that his symptoms started in the early morning hours 2 days ago.  He says that he has been taking his home pain medications and not able to get his pain under reasonable control so he ended up in the ER yesterday.  He reports that he woke up this morning at 4am with 10/10 pain and not able to control his pain.  He initially had vomiting when his symptoms started 2 days ago.  He reports that recent changes in weather have precipitated his symptoms.  He takes MS contin twice daily for long-acting pain control in addition to oral dilaudid which he takes for acute pain events.  He denies chest pain and shortness of breath.  He reports that he uses marijuana to help with pain management but does not smoke cigarettes.  He does report that he lives with his brother who is a heavy smoker and patient receives a lot of second hand smoke exposure.    Review of Systems:  Constitutional: No weight loss, night sweats, Fevers, chills, fatigue.  HEENT: No headaches, dizziness, seizures, vision changes, difficulty swallowing,Tooth/dental problems,Sore throat, No sneezing, itching, ear ache, nasal congestion, post nasal drip,  Cardio-vascular: No chest pain, Orthopnea, PND, swelling in lower extremities, anasarca, dizziness, palpitations  GI: No  heartburn, indigestion, abdominal pain, nausea, vomiting (not currently but did have 2 days ago), diarrhea, change in bowel habits, loss of appetite  Resp: No shortness of breath with exertion or at rest. No excess mucus, no productive cough, No non-productive cough, No coughing up of blood.No change in color of mucus.No wheezing.No chest wall deformity  Skin: no rash or lesions. Mild pruritus.   GU: no dysuria, change in color of urine, no urgency or frequency. No flank pain.  Musculoskeletal: No joint pain or swelling. No decreased range of motion. No back pain.  Psych:  No depression or anxiety. No memory loss.   Past Medical History  Diagnosis Date  . Sickle cell anemia   . Blood transfusion   . 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 Hypokalemia   . Mood disorder   . Pulmonary embolism   . Avascular necrosis   . Leukocytosis     Chronic  . Thrombocytosis     Chronic  . Hypertension   . C. difficile colitis   . Uses marijuana   . Chronic anticoagulation   . Functional asplenia    Past Surgical History  Procedure Laterality Date  . Right hip replacement      08/2006  . Cholecystectomy      01/2008  . Porta cath placement    . Porta cath removal    . Umbilical hernia repair      01/2008  . Excision of left periauricular cyst  10/2009  . Excision of right ear lobe cyst with primary closur      11/2007  . Portacath placement  01/05/2012    Procedure: INSERTION PORT-A-CATH;  Surgeon: Odis Hollingshead, MD;  Location: [place];  Service: General;  Laterality: N/A;  ultrasound guiced port a cath insertion with fluoroscopy   Social History:  reports that he quit smoking about 2 years ago. His smoking use included Cigarettes. He smoked 0.00 packs per day for 13 years. He has never used smokeless tobacco. He reports that he drinks about 6.0 ounces of alcohol per week. He reports that he uses illicit drugs (Marijuana).  Allergies  Allergen Reactions  .  Morphine And Related Hives and Rash    Pt states he can take PO, but not IV and he is able to tolerate Dilaudid with no reactions.    Family History  Problem Relation Age of Onset  . Sickle cell anemia Mother   . Sickle cell anemia Father   . Sickle cell trait Brother    Prior to Admission medications   Medication Sig Start Date End Date Taking? Authorizing Provider  celecoxib (CELEBREX) 200 MG capsule Take 1 capsule (200 mg total) by mouth 2 (two) times daily. 03/30/13  Yes Elwyn Reach, MD  folic acid (FOLVITE) 1 MG tablet Take 1 tablet (1 mg total) by mouth every morning. 06/05/12  Yes [name], MD  HYDROmorphone (DILAUDID) 4 MG tablet Take 1 tablet (4 mg total) by mouth every 4 (four) hours as needed for severe pain. 03/30/13  Yes Elwyn Reach, MD  hydroxyurea (HYDREA) 500 MG capsule Take 1 capsule (500 mg total) by mouth 2 (two) times daily with a meal. May take with food to minimize GI side effects. 03/30/13  Yes Elwyn Reach, MD  morphine (MS CONTIN) 15 MG 12 hr tablet Take 1 tablet (15 mg total) by mouth 2 (two) times daily. 03/30/13  Yes Elwyn Reach, MD  potassium chloride SA (K-DUR,KLOR-CON) 20 MEQ tablet Take 1 tablet (20 mEq total) by mouth every morning. 03/30/13  Yes Elwyn Reach, MD  Rivaroxaban (XARELTO) 20 MG TABS tablet Take 20 mg by mouth daily with supper.   Yes Historical Provider, MD  zolpidem (AMBIEN) 10 MG tablet Take 1 tablet (10 mg total) by mouth at bedtime as needed for sleep. 03/30/13  Yes Elwyn Reach, MD   Physical Exam: Filed Vitals:   04/22/13 0839  BP: 133/95  Pulse: 89  Temp: 97.7 F (36.5 C)  Resp: 18  Weight: 173 lb (78.472 kg)  SpO2: 98%   General appearance: alert, cooperative, icteric and mild distress Head: Normocephalic, without obvious abnormality, atraumatic Eyes: negative findings: lids and lashes normal and pupils equal, round, reactive to light and accomodation, positive findings: sclera icteric  bilateral Nose: Nares normal. Septum midline. Mucosa normal. No drainage or sinus tenderness., no discharge Throat: abnormal findings: dry, pale mucus membranes Neck: no adenopathy, no carotid bruit, no JVD and supple, symmetrical, trachea midline Lungs: clear to auscultation bilaterally and normal percussion bilaterally Chest wall: right sided costochondral tenderness, left sided costochondral tenderness Heart: S1, S2 normal Abdomen: soft, non-tender; bowel sounds normal; no masses,  no organomegaly Extremities: extremities normal, atraumatic, no cyanosis or edema Pulses: 2+ and symmetric Skin: Skin color, texture, turgor normal. No rashes or lesions Neurologic: Alert and oriented X 3, normal strength and tone. Normal symmetric reflexes. Normal coordination and gait  Labs on Admission:   Basic Metabolic Panel:  Recent Labs Lab 04/21/13 0900  NA 141  K 3.5*  CL 105  CO2 22  GLUCOSE 108*  BUN 9  CREATININE 0.59  CALCIUM 8.9   Liver Function Tests:  Recent Labs Lab 04/21/13 0900  AST 28  ALT 9  ALKPHOS 87  BILITOT 4.7*  PROT 7.5  ALBUMIN 3.9   CBC:  Recent Labs Lab 04/21/13 0900  WBC 19.3*  NEUTROABS 14.2*  HGB 8.0*  HCT 23.4*  MCV 92.5  PLT 496*   BNP: No components found with this basename: POCBNP,   Radiological Exams on Admission: No results found.  Assessment/Plan:    Vaso-occlusive sickle cell crisis   Hx of pulmonary embolus   Chronic anticoagulation   Sickle cell crisis   Uses marijuana   Thrombocytosis   Rib pain   Dehydration   Unspecified vitamin D deficiency   Hypokalemia   Hyperbilirubinemia  Admit to extended sickle cell care facility.  Initiate weight based rapid re-dosing protocol of IV dilaudid.  Monitor closely.  Starting IVFs.  Replacing potassium oral and IV.  In addition, upon review of records, pt has a very low Vit D level and has not been taking supplementation, I discussed this with him today and will start Drisdol 50,000  IU caps with first dose given today.   If patient pain level is 7 or above after weight based rapid re-dosing of dilaudid then will initiate dilaudid PCA. I have reviewed labs from yesterday.  I have ordered a repeat CMP today as patient was found to be hypokalemic in ER yesteday.  I have ordered a magnesium level and ordered a urine drug screen.  I have ordered meds for nausea and pruritus.  Resuming home MS Contin long-acting medications.  Resuming hydrea therapy.  I counseled him extensively regarding second hand tobacco exposure and patient verbalized understanding.   Time spend: 58 mins Code Status: FULL Family Communication: Pt was fully counseled at bedside and verbalized understanding Disposition Plan: Pending clinical course  Irwin Brakeman, MD  Pager [phone]  If 7PM-7AM, please contact night-coverage [email or web address] Password [facility] 04/22/2013, 9:12 AM

## 2013-04-22 NOTE — Discharge Summary (Signed)
[name] Discharge Summary   Patient ID: [name] MRN: [id number] DOB/AGE: 04/06/1980 33 y.o.  Admit date: 04/22/2013 Discharge date: 04/22/2013  Primary Care Physician:  [name],[name], MD  Admission Diagnoses:  Principal Problem:   Vaso-occlusive sickle cell crisis Active Problems:   Hx of pulmonary embolus   Chronic anticoagulation   Sickle cell crisis   Uses marijuana   Thrombocytosis   Rib pain   Dehydration   Unspecified vitamin D deficiency   Hypokalemia   Hyperbilirubinemia   Discharge Diagnoses:   Sickle cell pain crisis  Discharge Medications:    Medication List    ASK your doctor about these medications       celecoxib 200 MG capsule  Commonly known as:  CELEBREX  Take 1 capsule (200 mg total) by mouth 2 (two) times daily.     folic acid 1 MG tablet  Commonly known as:  FOLVITE  Take 1 tablet (1 mg total) by mouth every morning.     HYDROmorphone 4 MG tablet  Commonly known as:  DILAUDID  Take 1 tablet (4 mg total) by mouth every 4 (four) hours as needed for severe pain.     hydroxyurea 500 MG capsule  Commonly known as:  HYDREA  Take 1 capsule (500 mg total) by mouth 2 (two) times daily with a meal. May take with food to minimize GI side effects.     morphine 15 MG 12 hr tablet  Commonly known as:  MS CONTIN  Take 1 tablet (15 mg total) by mouth 2 (two) times daily.     potassium chloride SA 20 MEQ tablet  Commonly known as:  K-DUR,KLOR-CON  Take 1 tablet (20 mEq total) by mouth every morning.     XARELTO 20 MG Tabs tablet  Generic drug:  Rivaroxaban  Take 20 mg by mouth daily with supper.     zolpidem 10 MG tablet  Commonly known as:  AMBIEN  Take 1 tablet (10 mg total) by mouth at bedtime as needed for sleep.         Consults:  None Significant Diagnostic Studies:  Dg Chest 2 View  04/09/2013   CLINICAL DATA:  Bilateral lower rib pain  EXAM: CHEST  2 VIEW  COMPARISON:  DG CHEST 2 VIEW dated  04/07/2013; DG CHEST 2 VIEW dated 03/22/2013; DG CHEST 1V PORT dated 03/28/2013; CT ANGIO CHEST W/CM &/OR WO/CM dated 03/26/2013  FINDINGS: Grossly unchanged enlarged cardiac silhouette and mediastinal contours with nodular prominence of the bilateral pulmonary hila compatible with the mediastinal and hilar adenopathy demonstrated on recently performed chest CT. The lungs remain hyperexpanded with flattening of the bilateral hemidiaphragms mild diffuse slightly nodular thickening of the pulmonary interstitium. Stable positioning of support apparatus. No new focal airspace opacities. No pleural effusion or pneumothorax. No definite evidence of edema. Grossly unchanged bones.  IMPRESSION: 1.  No acute cardiopulmonary disease. 2. Grossly unchanged findings of cardiomegaly and mediastinal/hilar adenopathy as demonstrated on recently performed chest CT.   Electronically Signed   By: Sandi Mariscal M.D.   On: 04/09/2013 08:45   Dg Chest 2 View  04/07/2013   CLINICAL DATA:  Sickle cell crisis and cough.  EXAM: CHEST  2 VIEW  COMPARISON:  DG CHEST 1V PORT dated 03/28/2013  FINDINGS: Prominent interstitial lung markings suggest chronic changes. No focal airspace disease or overt pulmonary edema. There is a left subclavian Port-A-Cath. Catheter tip in the lower SVC. Heart size is normal and negative for pleural  effusions.  IMPRESSION: Chronic lung changes without acute findings.   Electronically Signed   By: Markus Daft M.D.   On: 04/07/2013 10:55   Ct Angio Chest Pe W/cm &/or Wo Cm  03/26/2013   CLINICAL DATA:  Hypoxemia.  Known history of pulmonary embolism.  EXAM: CT ANGIOGRAPHY CHEST WITH CONTRAST  TECHNIQUE: Multidetector CT imaging of the chest was performed using the standard protocol during bolus administration of intravenous contrast. Multiplanar CT image reconstructions and MIPs were obtained to evaluate the vascular anatomy.  CONTRAST:  151mL OMNIPAQUE IOHEXOL 350 MG/ML SOLN  COMPARISON:  DG CHEST 2 VIEW dated  03/22/2013; CT CHEST W/CM dated 11/17/2012; CT ANGIO CHEST W/CM &/OR WO/CM dated 11/13/2012  FINDINGS: Technically adequate study without pulmonary embolus. Aorta and branch vessels are within normal limits. There is no axillary adenopathy. Left subclavian Port-A-Cath is unchanged with the tip in the right atrium. Cardiomegaly is present with right heart enlargement. There is also enlargement of the left and right pulmonary arteries suggesting pulmonary arterial hypertension. Bilateral gynecomastia is incidentally noted.  There are scattered foci of peripheral ground-glass attenuation in both lungs, a finding which can be associated with acute chest syndrome and small vessel infarction with hemorrhagic edema. Bronchopneumonia and small vessel disease are in the differential considerations. Incidental imaging of the upper abdomen is grossly within normal limits. Mediastinal adenopathy appears similar to prior exam. Unchanged right hilar adenopathy. Notably, no pleural effusions. Sickle cell endplate infarcts are present in the thoracic spine with multiple Schmorl's nodes.  Review of the MIP images confirms the above findings.  IMPRESSION: 1. Negative for pulmonary embolus. 2. Scattered areas of ground-glass attenuation bilaterally in the lungs suggesting acute chest syndrome in this patient with sickle cell anemia. 3. Unchanged cardiomegaly it and mediastinal and hilar adenopathy. 4. Enlargement of the left and right pulmonary arteries and right heart enlargement suggesting pulmonary arterial hypertension.   Electronically Signed   By: Dereck Ligas M.D.   On: 03/26/2013 16:46   Dg Chest Port 1 View  03/28/2013   CLINICAL DATA:  Hypoxia  EXAM: PORTABLE CHEST - 1 VIEW  COMPARISON:  CT ANGIO CHEST W/CM &/OR WO/CM dated 03/26/2013; DG CHEST 2 VIEW dated 03/22/2013  FINDINGS: Stable left subclavian Port-A-Cath. Cardiomegaly. Clear lungs. No pneumothorax.  IMPRESSION: No active disease.   Electronically Signed   By: Maryclare Bean M.D.   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 He had a bout of acute chest syndrome a couple of months ago and multiple recent hospitalizations. He was seen in the ER yesterday and treated with IV dilaudid and was able to go home. He reports that his symptoms started in the early morning hours 2 days ago. He says that he has been taking his home pain medications and not able to get his pain under reasonable control so he ended up in the ER yesterday. He reports that he woke up this morning at 4am with 10/10 pain and not able to control his pain. He initially had vomiting when his symptoms started 2 days ago. He reports that recent changes in weather have precipitated his symptoms. He takes MS contin twice daily for long-acting pain control  in addition to oral dilaudid which he takes for acute pain events. He denies chest pain and shortness of breath. He reports that he uses marijuana to help with pain management but does not smoke cigarettes. He does report that he lives with his brother who is a heavy smoker and patient receives a lot of second hand smoke exposure.      [facility]: [facility]. Initiated weight based rapid re-dosing protocol of IV dilaudid. Monitored closely. Started IVFs. Replaced potassium oral and IV. In addition, upon review of records, pt has a very low Vit D level and had not been taking supplementation, started Drisdol 50,000 IU caps with first dose given today. Patient's pain level  Remained above 7 after weight based rapid re-dosing of dilaudid, transitioned to weight based dilaudid PCA. Patient  used a total of 21.5 mg Dilaudid per PCA during extended observation . Resumed home MS Contin long-acting medications and hydrea therapy. Patient counseled extensively regarding second hand tobacco exposure and patient verbalized understanding.  Patient states that current pain intensity is 6/10, which is decreased from 10/10. Was given 2 mg of IV Dilaudid 1 hour prior to discharge and started Dilaudid 4 mg po. Patient is functional and reports that he can manage at home on current medication regimen.   Patient to follow up in office as scheduled.     Physical Exam at Discharge:  BP 117/70  Pulse 86  Temp(Src) 98.7 F (37.1 C)  Resp 21  Wt 173 lb (78.472 kg)  SpO2 96% General appearance: alert, cooperative, and mildly icteric  Head: Normocephalic, without obvious abnormality, atraumatic  no carotid bruit, no JVD and supple, symmetrical, trachea midline  Lungs: clear to auscultation bilaterally and normal percussion bilaterally  Chest wall: right sided costochondral tenderness, left sided costochondral tenderness  Heart: S1, S2 normal  Abdomen: soft, non-tender; bowel sounds normal; no masses, no organomegaly  Extremities: extremities normal, atraumatic, no cyanosis or edema  Pulses: 2+ and symmetric  Skin: Skin color, texture, turgor normal. No rashes or lesions  Neurologic: Alert and oriented X 3, normal strength and tone. Normal symmetric reflexes. Normal coordination and gait     Disposition at Discharge: 01-Home or Self Care  Discharge Orders:      Future Appointments Provider Department Dept Phone   05/15/2013 10:00 AM Dorena Dew, [place] [phone]      Condition at Discharge:   Stable  Time spent on Discharge:  Greater than 30 minutes.  Signed: Sayward Horvath M 04/22/2013, 5:33 PM

## 2013-04-23 ENCOUNTER — Telehealth (HOSPITAL_COMMUNITY): Payer: Self-pay | Admitting: Hematology

## 2013-04-23 IMAGING — CR DG CHEST 2V
2 series · 2 of 2 positions shown · non-contrast
Comparison: 05/16/2010

CLINICAL DATA: Chest pain and shortness of breath.  Sickle cell
patient.

CHEST - 2 VIEW

[w chest pa]
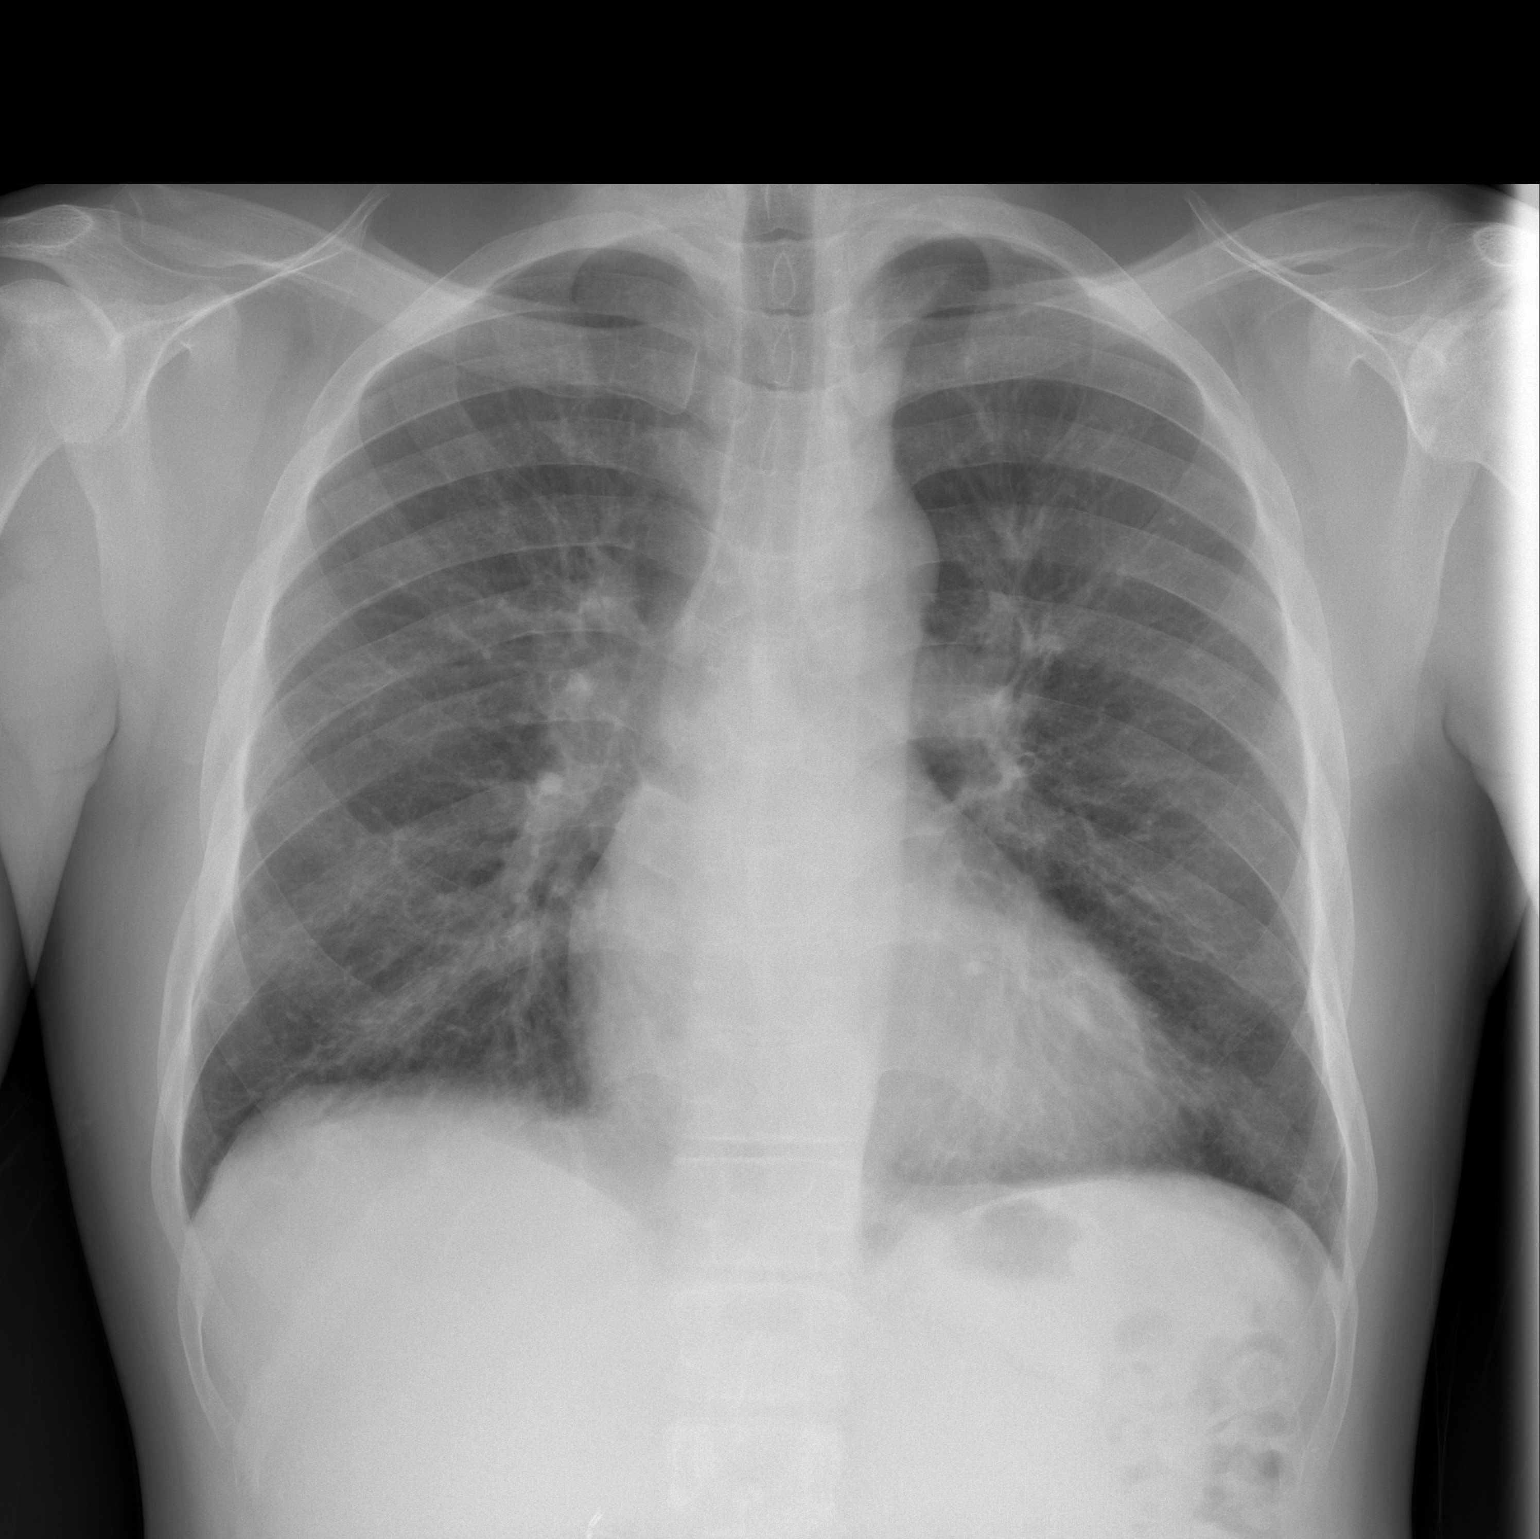

[w chest lat]
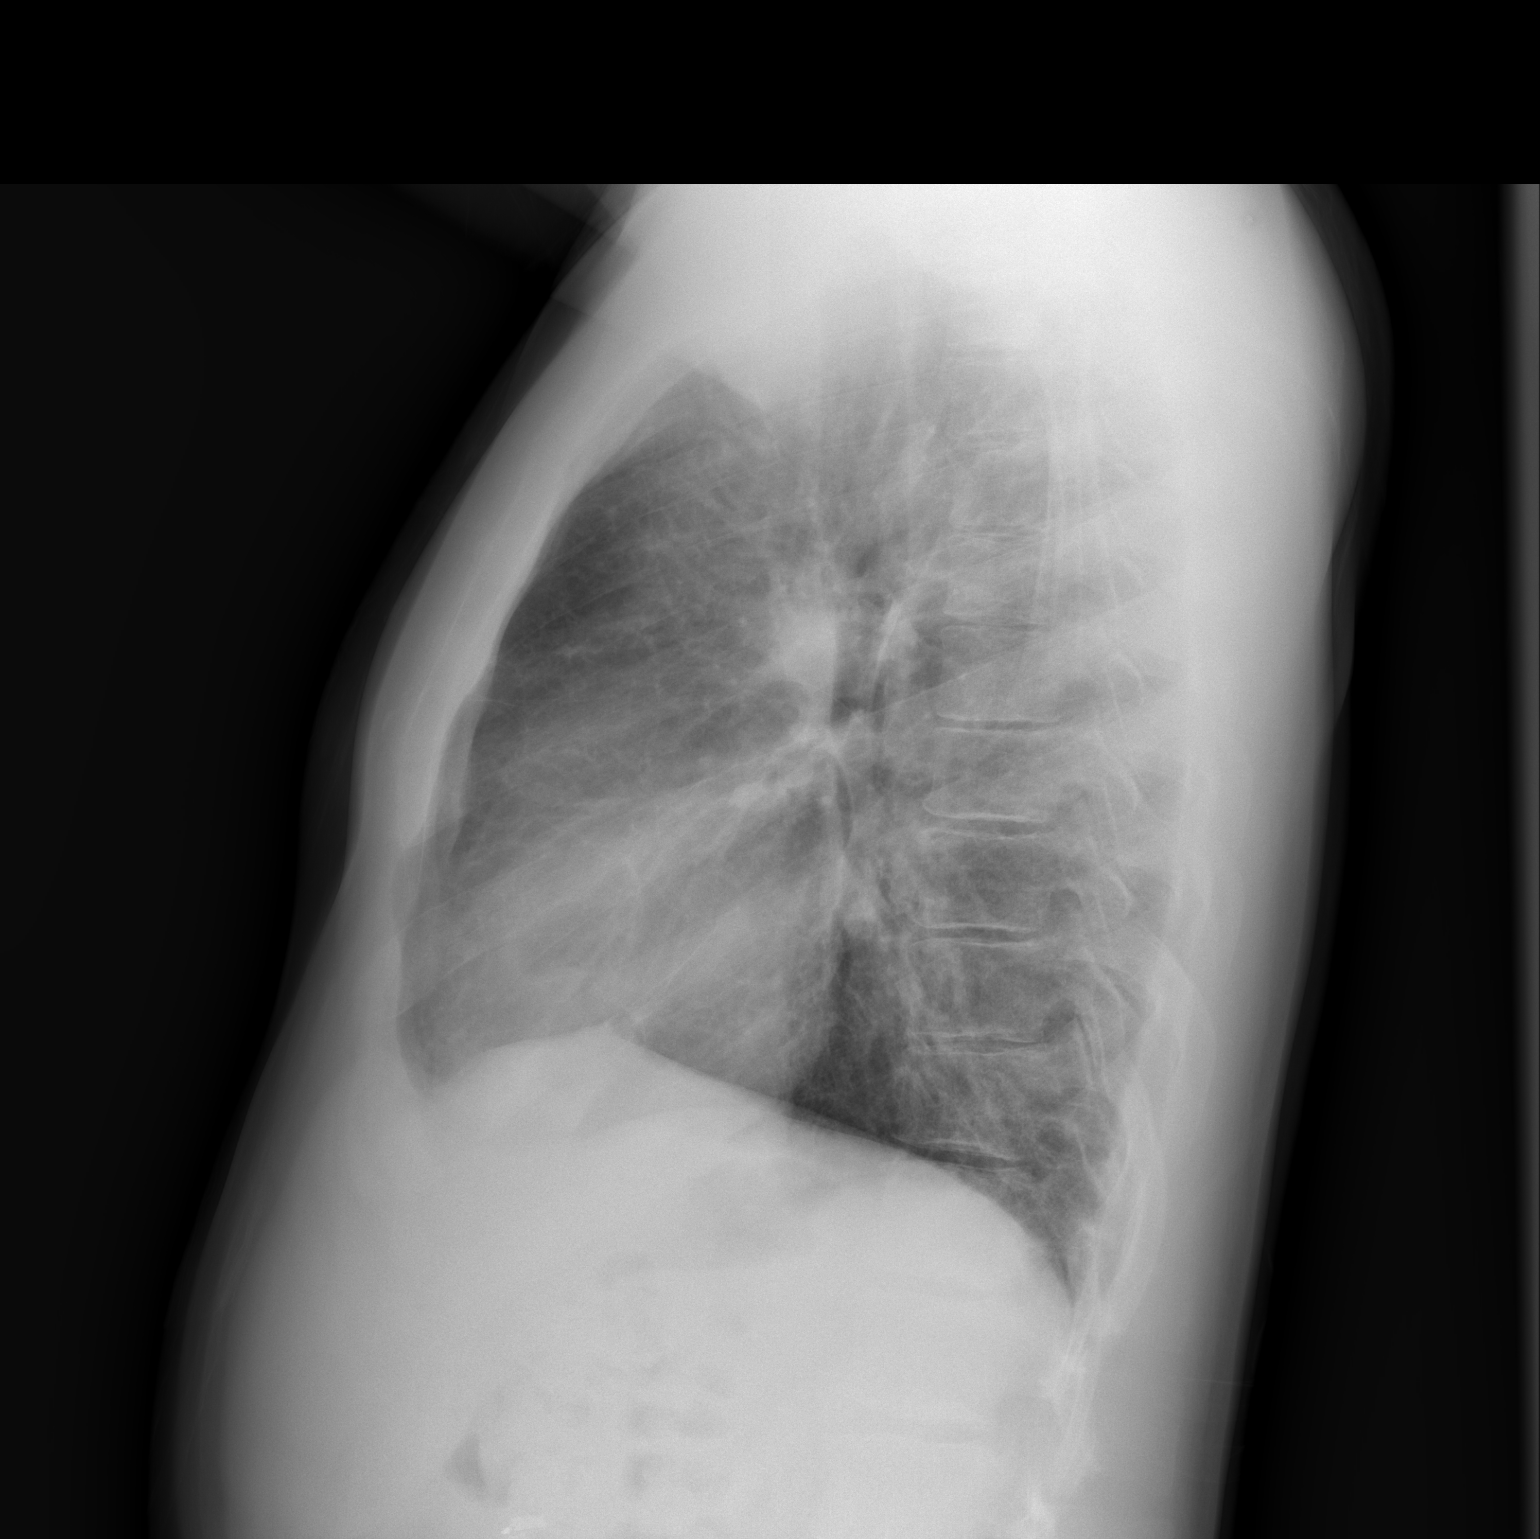

[2 of 2 positions shown; findings below may reference images not displayed]

FINDINGS: Trachea is midline.  Heart size stable.  Mild bibasilar
scarring.  Lungs are otherwise clear.  No pleural fluid.
IMPRESSION: No acute findings.

## 2013-04-23 NOTE — Telephone Encounter (Signed)
Patient called in C/O pain to ribs bilaterally that he rates 8/10 on pain scale.  Patient denies any shortness of breath or difficulty breathing, denies nausea/vomiting, abdominal pain or fever.  Patient wanting to come to [facility].  Placed caller and hold and verbally spoke with the [place], [place].  I advised the patient that is was to late in the day to come to day hospital.  I explained that if he could, try and manage at home and call us first thing in the morning for triage.  If he can not manage, go to the ED.  Patient verbalizes understanding.

## 2013-04-23 NOTE — Discharge Summary (Signed)
Pt was seen and examined.  I agree with assessment and plan as detailed by Cammie Sickle, NP.   Gerlene Fee, MD, CDE, [place], [place]

## 2013-04-24 ENCOUNTER — Telehealth (HOSPITAL_COMMUNITY): Payer: Self-pay | Admitting: Hematology

## 2013-04-24 ENCOUNTER — Non-Acute Institutional Stay (HOSPITAL_COMMUNITY)
Admission: AD | Admit: 2013-04-24 | Discharge: 2013-04-24 | Disposition: A | Discharge status: Home / Self Care | Payer: Medicare Other | Attending: Internal Medicine | Admitting: Internal Medicine

## 2013-04-24 ENCOUNTER — Encounter (HOSPITAL_COMMUNITY): Payer: Self-pay | Admitting: *Deleted

## 2013-04-24 DIAGNOSIS — D72829 Elevated white blood cell count, unspecified: Secondary | ICD-10-CM | POA: Insufficient documentation

## 2013-04-24 DIAGNOSIS — D5700 Hb-SS disease with crisis, unspecified: Secondary | ICD-10-CM | POA: Diagnosis present

## 2013-04-24 DIAGNOSIS — Z87891 Personal history of nicotine dependence: Secondary | ICD-10-CM | POA: Insufficient documentation

## 2013-04-24 DIAGNOSIS — L299 Pruritus, unspecified: Secondary | ICD-10-CM | POA: Diagnosis not present

## 2013-04-24 DIAGNOSIS — D571 Sickle-cell disease without crisis: Secondary | ICD-10-CM

## 2013-04-24 DIAGNOSIS — Z86711 Personal history of pulmonary embolism: Secondary | ICD-10-CM | POA: Insufficient documentation

## 2013-04-24 DIAGNOSIS — R17 Unspecified jaundice: Secondary | ICD-10-CM | POA: Insufficient documentation

## 2013-04-24 DIAGNOSIS — I10 Essential (primary) hypertension: Secondary | ICD-10-CM | POA: Insufficient documentation

## 2013-04-24 DIAGNOSIS — R079 Chest pain, unspecified: Secondary | ICD-10-CM | POA: Insufficient documentation

## 2013-04-24 DIAGNOSIS — Z7901 Long term (current) use of anticoagulants: Secondary | ICD-10-CM | POA: Insufficient documentation

## 2013-04-24 DIAGNOSIS — Z96649 Presence of unspecified artificial hip joint: Secondary | ICD-10-CM | POA: Diagnosis not present

## 2013-04-24 DIAGNOSIS — Z86718 Personal history of other venous thrombosis and embolism: Secondary | ICD-10-CM | POA: Diagnosis not present

## 2013-04-24 DIAGNOSIS — Z79899 Other long term (current) drug therapy: Secondary | ICD-10-CM | POA: Insufficient documentation

## 2013-04-24 DIAGNOSIS — Z9089 Acquired absence of other organs: Secondary | ICD-10-CM | POA: Diagnosis not present

## 2013-04-24 DIAGNOSIS — R0781 Pleurodynia: Secondary | ICD-10-CM | POA: Diagnosis present

## 2013-04-24 LAB — COMPREHENSIVE METABOLIC PANEL WITH GFR
ALT: 12 U/L (ref 0–53)
AST: 22 U/L (ref 0–37)
Albumin: 3.9 g/dL (ref 3.5–5.2)
Alkaline Phosphatase: 83 U/L (ref 39–117)
BUN: 6 mg/dL (ref 6–23)
CO2: 20 meq/L (ref 19–32)
Calcium: 9.2 mg/dL (ref 8.4–10.5)
Chloride: 109 meq/L (ref 96–112)
Creatinine, Ser: 0.52 mg/dL (ref 0.50–1.35)
GFR calc Af Amer: >90 mL/min
GFR calc non Af Amer: >90 mL/min
Glucose, Bld: 89 mg/dL (ref 70–99)
Potassium: 3.5 meq/L — ABNORMAL LOW (ref 3.7–5.3)
Sodium: 140 meq/L (ref 137–147)
Total Bilirubin: 5.2 mg/dL — ABNORMAL HIGH (ref 0.3–1.2)
Total Protein: 7.5 g/dL (ref 6.0–8.3)

## 2013-04-24 LAB — CBC WITH DIFFERENTIAL/PLATELET
BASOS ABS: 0.1 10*3/uL (ref 0.0–0.1)
Basophils Relative: 1 % (ref 0–1)
EOS PCT: 3 % (ref 0–5)
Eosinophils Absolute: 0.7 10*3/uL (ref 0.0–0.7)
HCT: 24 % — ABNORMAL LOW (ref 39.0–52.0)
Hemoglobin: 8.3 g/dL — ABNORMAL LOW (ref 13.0–17.0)
Lymphocytes Relative: 11 % — ABNORMAL LOW (ref 12–46)
Lymphs Abs: 2.4 10*3/uL (ref 0.7–4.0)
MCH: 31.8 pg (ref 26.0–34.0)
MCHC: 34.6 g/dL (ref 30.0–36.0)
MCV: 92 fL (ref 78.0–100.0)
Monocytes Absolute: 2 10*3/uL — ABNORMAL HIGH (ref 0.1–1.0)
Monocytes Relative: 9 % (ref 3–12)
Neutro Abs: 16.3 10*3/uL — ABNORMAL HIGH (ref 1.7–7.7)
Neutrophils Relative %: 76 % (ref 43–77)
Platelets: 430 10*3/uL — ABNORMAL HIGH (ref 150–400)
RBC: 2.61 MIL/uL — ABNORMAL LOW (ref 4.22–5.81)
RDW: 20.8 % — AB (ref 11.5–15.5)
WBC: 21.5 10*3/uL — AB (ref 4.0–10.5)

## 2013-04-24 IMAGING — CR DG CHEST 1V PORT
1 series · 1 of 1 positions shown · non-contrast
Comparison: 05/31/2010

CLINICAL DATA: PICC line placement.

PORTABLE CHEST - 1 VIEW

[AP]
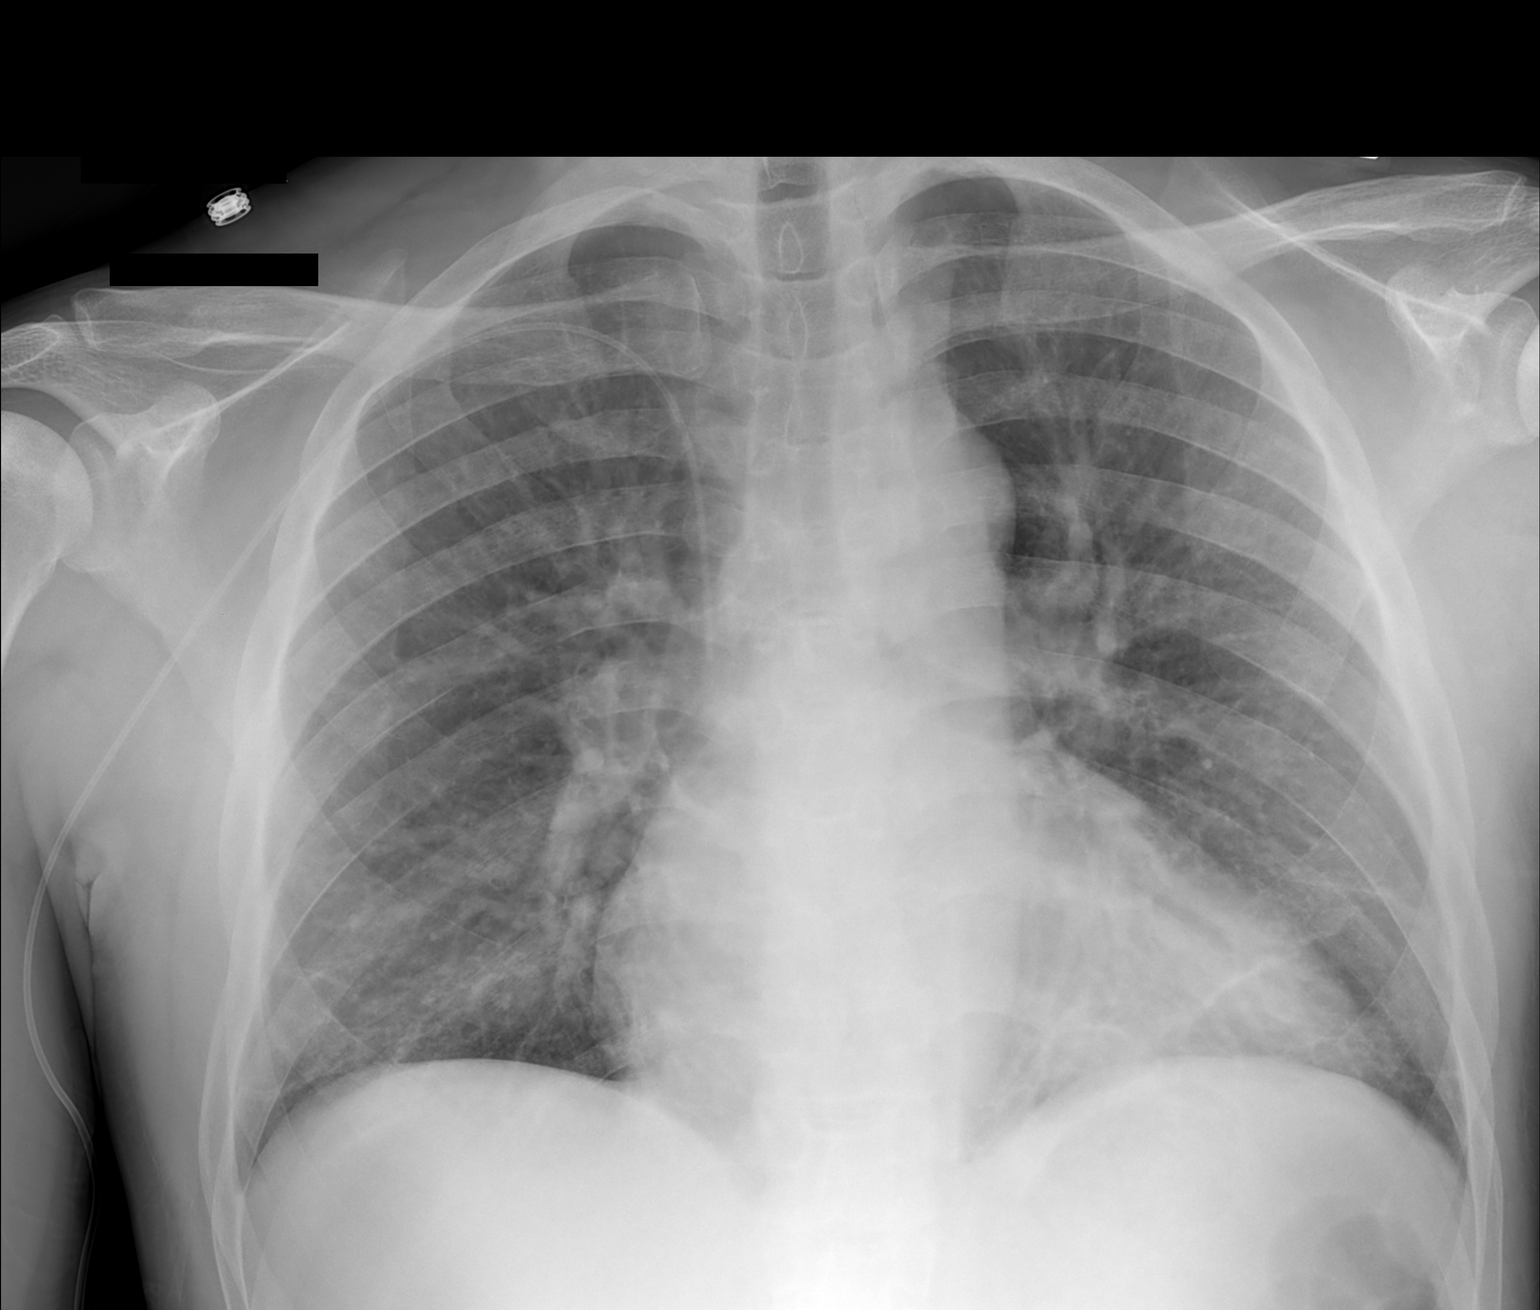

[1 of 1 positions shown; findings below may reference images not displayed]

FINDINGS: Right upper extremity PICC line is in place.  The tip
lies in the SVC.  Lungs show mild interval pulmonary venous
congestion since the prior study consistent with venous
hypertension.  No overt edema or pleural fluid.  The heart size is
at the upper limits of normal.
IMPRESSION: PICC line tip in SVC.  Increased pulmonary venous congestion since
prior chest x-ray.

## 2013-04-24 MED ORDER — DIPHENHYDRAMINE HCL 12.5 MG/5ML PO ELIX: 12.5000 mg | ORAL_SOLUTION | Freq: Four times a day (QID) | ORAL | Status: DC | PRN

## 2013-04-24 MED ORDER — SODIUM CHLORIDE 0.9 % IJ SOLN
10.0000 mL | INTRAMUSCULAR | Status: AC | PRN
  Administered 2013-04-24: 10 mL

## 2013-04-24 MED ORDER — DEXTROSE-NACL 5-0.45 % IV SOLN
INTRAVENOUS | Status: DC
Start: 2013-04-24 — End: 2013-04-24
  Administered 2013-04-24: 13:00:00 via INTRAVENOUS

## 2013-04-24 MED ORDER — HYDROMORPHONE HCL PF 2 MG/ML IJ SOLN
1.9000 mg | Freq: Once | INTRAMUSCULAR | Status: AC
  Administered 2013-04-24: 1.9 mg via INTRAVENOUS
  Filled 2013-04-24: qty 1

## 2013-04-24 MED ORDER — HYDROMORPHONE HCL PF 2 MG/ML IJ SOLN
2.8000 mg | Freq: Once | INTRAMUSCULAR | Status: AC
  Administered 2013-04-24: 2.8 mg via INTRAVENOUS
  Filled 2013-04-24: qty 2

## 2013-04-24 MED ORDER — NALOXONE HCL 0.4 MG/ML IJ SOLN: 0.4000 mg | INTRAMUSCULAR | Status: DC | PRN

## 2013-04-24 MED ORDER — MORPHINE SULFATE ER 15 MG PO TBCR: 15.0000 mg | EXTENDED_RELEASE_TABLET | Freq: Two times a day (BID) | ORAL | Status: DC

## 2013-04-24 MED ORDER — ONDANSETRON HCL 4 MG/2ML IJ SOLN: 4.0000 mg | Freq: Four times a day (QID) | INTRAMUSCULAR | Status: DC | PRN

## 2013-04-24 MED ORDER — HYDROMORPHONE HCL 4 MG PO TABS
4.0000 mg | ORAL_TABLET | Freq: Once | ORAL | Status: AC
  Administered 2013-04-24: 4 mg via ORAL
  Filled 2013-04-24: qty 1

## 2013-04-24 MED ORDER — HYDROMORPHONE HCL 4 MG PO TABS: 4.0000 mg | ORAL_TABLET | ORAL | Status: DC | PRN

## 2013-04-24 MED ORDER — ZOLPIDEM TARTRATE 10 MG PO TABS: 10.0000 mg | ORAL_TABLET | Freq: Every evening | ORAL | Status: DC | PRN

## 2013-04-24 MED ORDER — SODIUM CHLORIDE 0.9 % IJ SOLN
INTRAMUSCULAR | Status: AC
  Filled 2013-04-24: qty 10

## 2013-04-24 MED ORDER — KETOROLAC TROMETHAMINE 30 MG/ML IJ SOLN
30.0000 mg | Freq: Once | INTRAMUSCULAR | Status: AC
  Administered 2013-04-24: 30 mg via INTRAVENOUS
  Filled 2013-04-24: qty 1

## 2013-04-24 MED ORDER — HYDROMORPHONE HCL PF 2 MG/ML IJ SOLN
2.3000 mg | Freq: Once | INTRAMUSCULAR | Status: AC
  Administered 2013-04-24: 2.3 mg via INTRAVENOUS
  Filled 2013-04-24: qty 2

## 2013-04-24 MED ORDER — HEPARIN SOD (PORK) LOCK FLUSH 100 UNIT/ML IV SOLN
500.0000 [IU] | INTRAVENOUS | Status: AC | PRN
  Administered 2013-04-24: 500 [IU]
  Filled 2013-04-24: qty 5

## 2013-04-24 MED ORDER — DIPHENHYDRAMINE HCL 50 MG/ML IJ SOLN
12.5000 mg | Freq: Four times a day (QID) | INTRAMUSCULAR | Status: DC | PRN
Start: 2013-04-24 — End: 2013-04-24

## 2013-04-24 MED ORDER — HYDROMORPHONE 0.3 MG/ML IV SOLN
INTRAVENOUS | Status: DC
  Administered 2013-04-24: 15:00:00 via INTRAVENOUS
  Administered 2013-04-24: 5.59 mg via INTRAVENOUS
  Administered 2013-04-24: 17:00:00 via INTRAVENOUS
  Administered 2013-04-24: 6.99 mg via INTRAVENOUS
  Filled 2013-04-24 (×2): qty 25

## 2013-04-24 MED ORDER — DIPHENHYDRAMINE HCL 25 MG PO CAPS: 25.0000 mg | ORAL_CAPSULE | Freq: Once | ORAL | Status: DC

## 2013-04-24 MED ORDER — SODIUM CHLORIDE 0.9 % IJ SOLN: 9.0000 mL | INTRAMUSCULAR | Status: DC | PRN

## 2013-04-24 NOTE — Progress Notes (Signed)
Patient discharged home. VSS. Patient in no apparent distress. Discharge instructions reviewed, prescriptions given, patient acknowledges. Patient left day hospital with belongings ambulatory via self.

## 2013-04-24 NOTE — Discharge Instructions (Signed)
Sickle Cell Anemia, Adult °Sickle cell anemia is a condition in which red blood cells have an abnormal "sickle" shape. This abnormal shape shortens the cells' life span, which results in a lower than normal concentration of red blood cells in the blood. The sickle shape also causes the cells to clump together and block free blood flow through the blood vessels. As a result, the tissues and organs of the body do not receive enough oxygen. Sickle cell anemia causes organ damage and pain and increases the risk of infection. °CAUSES  °Sickle cell anemia is a genetic disorder. Those who receive two copies of the gene have the condition, and those who receive one copy have the trait. °RISK FACTORS °The sickle cell gene is most common in people whose families originated in [place]. Other areas of the globe where sickle cell trait occurs include the Mediterranean, [place] and [place], the [place], and the [place].  °SIGNS AND SYMPTOMS °· Pain, especially in the extremities, back, chest, or abdomen (common). The pain may start suddenly or may develop following an illness, especially if there is dehydration. Pain can also occur due to overexertion or exposure to extreme temperature changes. °· Frequent severe bacterial infections, especially certain types of pneumonia and meningitis. °· Pain and swelling in the hands and feet. °· Decreased activity.   °· Loss of appetite.   °· Change in behavior. °· Headaches. °· Seizures. °· Shortness of breath or difficulty breathing. °· Vision changes. °· Skin ulcers. °Those with the trait may not have symptoms or they may have mild symptoms.  °DIAGNOSIS  °Sickle cell anemia is diagnosed with blood tests that demonstrate the genetic trait. It is often diagnosed during the newborn period, due to mandatory testing nationwide. A variety of blood tests, X-rays, CT scans, MRI scans, ultrasounds, and lung function tests may also be done to monitor the condition. °TREATMENT  °Sickle  cell anemia may be treated with: °· Medicines. You may be given pain medicines, antibiotic medicines (to treat and prevent infections) or medicines to increase the production of certain types of hemoglobin. °· Fluids. °· Oxygen. °· Blood transfusions. °HOME CARE INSTRUCTIONS  °· Drink enough fluid to keep your urine clear or pale yellow. Increase your fluid intake in hot weather and during exercise. °· Do not smoke. Smoking lowers oxygen levels in the blood.   °· Only take over-the-counter or prescription medicines for pain, fever, or discomfort as directed by your health care provider. °· Take antibiotics as directed by your health care provider. Make sure you finish them it even if you start to feel better.   °· Take supplements as directed by your health care provider.   °· Consider wearing a medical alert bracelet. This tells anyone caring for you in an emergency of your condition.   °· When traveling, keep your medical information, health care provider's names, and the medicines you take with you at all times.   °· If you develop a fever, do not take medicines to reduce the fever right away. This could cover up a problem that is developing. Notify your health care provider. °· Keep all follow-up appointments with your health care provider. Sickle cell anemia requires regular medical care. °SEEK MEDICAL CARE IF: ° You have a fever. °SEEK IMMEDIATE MEDICAL CARE IF:  °· You feel dizzy or faint.   °· You have new abdominal pain, especially on the left side near the stomach area.   °· You develop a persistent, often uncomfortable and painful penile erection (priapism). If this is not treated immediately it   will lead to impotence.   °· You have numbness your arms or legs or you have a hard time moving them.   °· You have a hard time with speech.   °· You have a fever or persistent symptoms for more than 2 3 days.   °· You have a fever and your symptoms suddenly get worse.   °· You have signs or symptoms of infection.  These include:   °· Chills.   °· Abnormal tiredness (lethargy).   °· Irritability.   °· Poor eating.   °· Vomiting.   °· You develop pain that is not helped with medicine.   °· You develop shortness of breath. °· You have pain in your chest.   °· You are coughing up pus-like or bloody sputum.   °· You develop a stiff neck. °· Your feet or hands swell or have pain. °· Your abdomen appears bloated. °· You develop joint pain. °MAKE SURE YOU: °· Understand these instructions. °· Will watch your child's condition. °· Will get help right away if your child is not doing well or gets worse. °Document Released: 04/27/2005 Document Revised: 11/07/2012 Document Reviewed: 08/29/2012 °[employer]® Patient Information ©2014 [employer]. ° °

## 2013-04-24 NOTE — Telephone Encounter (Signed)
Patient C/O pain to ribs bilaterally and back 8/10.  Per patient, no improvement with home medications.  Denies chest pain, denies abdominal pain, denies fever or nausea/vomiting, or diarrhea, and denies priapisim.  Placed caller on hold and spoke with [place], NP and it is ok for the patient to come to [facility].  Patient verbalizes understanding.

## 2013-04-24 NOTE — Discharge Summary (Signed)
[facility] Discharge Summary   Patient ID: [name] MRN: [id number] DOB/AGE: 10-30-1980 33 y.o.  Admit date: 04/24/2013 Discharge date: 04/24/2013  Primary Care Physician:  [name],[name], MD  Admission Diagnoses:  Active Problems:   Rib pain   Acute sickle cell crisis   Discharge Diagnoses:   Acute sickle cell crisis  Discharge Medications:    Medication List         celecoxib 200 MG capsule  Commonly known as:  CELEBREX  Take 1 capsule (200 mg total) by mouth 2 (two) times daily.     ergocalciferol 50000 UNITS capsule  Commonly known as:  VITAMIN D2  Take 1 capsule (50,000 Units total) by mouth once a week.     folic acid 1 MG tablet  Commonly known as:  FOLVITE  Take 1 tablet (1 mg total) by mouth every morning.     HYDROmorphone 4 MG tablet  Commonly known as:  DILAUDID  Take 1 tablet (4 mg total) by mouth every 4 (four) hours as needed for severe pain.     hydroxyurea 500 MG capsule  Commonly known as:  HYDREA  Take 1 capsule (500 mg total) by mouth 2 (two) times daily with a meal. May take with food to minimize GI side effects.     morphine 15 MG 12 hr tablet  Commonly known as:  MS CONTIN  Take 1 tablet (15 mg total) by mouth 2 (two) times daily.     potassium chloride SA 20 MEQ tablet  Commonly known as:  K-DUR,KLOR-CON  Take 1 tablet (20 mEq total) by mouth every morning.     XARELTO 20 MG Tabs tablet  Generic drug:  Rivaroxaban  Take 20 mg by mouth daily with supper.     zolpidem 10 MG tablet  Commonly known as:  AMBIEN  Take 1 tablet (10 mg total) by mouth at bedtime as needed for sleep.         Consults:  None  Significant Diagnostic Studies:  Dg Chest 2 View  04/09/2013   CLINICAL DATA:  Bilateral lower rib pain  EXAM: CHEST  2 VIEW  COMPARISON:  DG CHEST 2 VIEW dated 04/07/2013; DG CHEST 2 VIEW dated 03/22/2013; DG CHEST 1V PORT dated 03/28/2013; CT ANGIO CHEST W/CM &/OR WO/CM dated 03/26/2013  FINDINGS:  Grossly unchanged enlarged cardiac silhouette and mediastinal contours with nodular prominence of the bilateral pulmonary hila compatible with the mediastinal and hilar adenopathy demonstrated on recently performed chest CT. The lungs remain hyperexpanded with flattening of the bilateral hemidiaphragms mild diffuse slightly nodular thickening of the pulmonary interstitium. Stable positioning of support apparatus. No new focal airspace opacities. No pleural effusion or pneumothorax. No definite evidence of edema. Grossly unchanged bones.  IMPRESSION: 1.  No acute cardiopulmonary disease. 2. Grossly unchanged findings of cardiomegaly and mediastinal/hilar adenopathy as demonstrated on recently performed chest CT.   Electronically Signed   By: Sandi Mariscal M.D.   On: 04/09/2013 08:45   Dg Chest 2 View  04/07/2013   CLINICAL DATA:  Sickle cell crisis and cough.  EXAM: CHEST  2 VIEW  COMPARISON:  DG CHEST 1V PORT dated 03/28/2013  FINDINGS: Prominent interstitial lung markings suggest chronic changes. No focal airspace disease or overt pulmonary edema. There is a left subclavian Port-A-Cath. Catheter tip in the lower SVC. Heart size is normal and negative for pleural effusions.  IMPRESSION: Chronic lung changes without acute findings.   Electronically Signed   By: Quita Skye  Anselm Pancoast M.D.   On: 04/07/2013 10:55   Ct Angio Chest Pe W/cm &/or Wo Cm  03/26/2013   CLINICAL DATA:  Hypoxemia.  Known history of pulmonary embolism.  EXAM: CT ANGIOGRAPHY CHEST WITH CONTRAST  TECHNIQUE: Multidetector CT imaging of the chest was performed using the standard protocol during bolus administration of intravenous contrast. Multiplanar CT image reconstructions and MIPs were obtained to evaluate the vascular anatomy.  CONTRAST:  157mL OMNIPAQUE IOHEXOL 350 MG/ML SOLN  COMPARISON:  DG CHEST 2 VIEW dated 03/22/2013; CT CHEST W/CM dated 11/17/2012; CT ANGIO CHEST W/CM &/OR WO/CM dated 11/13/2012  FINDINGS: Technically adequate study without  pulmonary embolus. Aorta and branch vessels are within normal limits. There is no axillary adenopathy. Left subclavian Port-A-Cath is unchanged with the tip in the right atrium. Cardiomegaly is present with right heart enlargement. There is also enlargement of the left and right pulmonary arteries suggesting pulmonary arterial hypertension. Bilateral gynecomastia is incidentally noted.  There are scattered foci of peripheral ground-glass attenuation in both lungs, a finding which can be associated with acute chest syndrome and small vessel infarction with hemorrhagic edema. Bronchopneumonia and small vessel disease are in the differential considerations. Incidental imaging of the upper abdomen is grossly within normal limits. Mediastinal adenopathy appears similar to prior exam. Unchanged right hilar adenopathy. Notably, no pleural effusions. Sickle cell endplate infarcts are present in the thoracic spine with multiple Schmorl's nodes.  Review of the MIP images confirms the above findings.  IMPRESSION: 1. Negative for pulmonary embolus. 2. Scattered areas of ground-glass attenuation bilaterally in the lungs suggesting acute chest syndrome in this patient with sickle cell anemia. 3. Unchanged cardiomegaly it and mediastinal and hilar adenopathy. 4. Enlargement of the left and right pulmonary arteries and right heart enlargement suggesting pulmonary arterial hypertension.   Electronically Signed   By: Dereck Ligas M.D.   On: 03/26/2013 16:46   Dg Chest Port 1 View  03/28/2013   CLINICAL DATA:  Hypoxia  EXAM: PORTABLE CHEST - 1 VIEW  COMPARISON:  CT ANGIO CHEST W/CM &/OR WO/CM dated 03/26/2013; DG CHEST 2 VIEW dated 03/22/2013  FINDINGS: Stable left subclavian Port-A-Cath. Cardiomegaly. Clear lungs. No pneumothorax.  IMPRESSION: No active disease.   Electronically Signed   By: Maryclare Bean M.D.   On: 03/28/2013 07:43    History or present illness: Patient with a history of sickle cell diseaseHbSS and a history of  PE on chronic anticoagulation, is presenting to the day hospital with severe acute pain in bilateral rib cage 8/10 that is consistent with patient's frequent sickle cell vaso-occlusive pain crisis episodes. Patient was treated for pain in day hospital on 04/22/2013 and had frequent multiple hospitalizations for pain. Reports that his symptoms started on 04/23/2013 around 1 pm. He says that he has been taking his home pain medications and not able to get his pain under reasonable control so he called the day hospital late yesterday afternoon. He reports that he woke up this morning at 6am with 8/10 pain Intensity described as throbbing, constant, and non-radiating unrelieved by current medication regimen. Patient attributes current symptoms to fluctuations in weather. He takes MS contin twice daily for long-acting pain control in addition to oral dilaudid which he takes for acute pain events. Patient admits that he uses marijuana occasionally, but denies smoking cigarettes. He denies chest pain, shortness of breath, dizziness, N/VD, and headache   [facility]: Ad[facility] for extended observation.  Initiated weight based rapid re-dosing  protocol of IV dilaudid. Monitored closely. Pain intensity remained greater than 7/10, transitioned to weight based dilaudid PCA. Total dilaudid PCA use was 12.58, with 18 demands and 18 deliveries.  Given IV Toradol X 1 dose.  Hypotonic IVFs. Reviewed CBC, chronic leukocytosis and hyperbilirubinemia consistent with previous values. Patient to continue KDur potassium supplement. Patient is currently functional and maintains that he can manage at home on current medication regimen. Patient to follow up in office as scheduled.     Physical Exam at Discharge:  BP 114/78  Pulse 87  Temp(Src) 98.9 F (37.2 C) (Oral)  Resp 20  SpO2 93% General appearance: alert, cooperative, icteric and mild distress  Lungs: clear to  auscultation bilaterally and normal percussion bilaterally  Chest wall: right sided costochondral tenderness, left sided costochondral tenderness  Heart: S1, S2 normal  Abdomen: soft, non-tender; bowel sounds normal; no masses, no organomegaly  Extremities: extremities normal, atraumatic, no cyanosis or edema  Pulses: 2+ and symmetric     Disposition at Discharge: 01-Home or Self Care  Discharge Orders:      Future Appointments Provider Department Dept Phone   05/15/2013 10:00 AM Dorena Dew, [place] [phone]      Condition at Discharge:   Stable  Time spent on Discharge:  Greater than 30 minutes.  Signed: Tanyla Stege M 04/24/2013, 6:11 PM

## 2013-04-24 NOTE — H&P (Signed)
[facility] History and Physical   Date: 04/24/2013  Patient name: [name] Medical record number: [id number] Date of birth: Jul 27, 1980 Age: 33 y.o. Gender: male PCP: [name],[name], MD  Attending physician: Elwyn Reach, MD  Chief Complaint: Acute rib cage pain   History of Present Illness: Patient with a history of sickle cell diseaseHbSS and a  history of PE on chronic anticoagulation, is presenting to the day hospital with severe acute pain in bilateral rib cage 8/10 that is consistent with patient's  frequent sickle cell vaso-occlusive pain crisis episodes. Patient was treated for pain in day hospital on 04/22/2013 and had frequent multiple hospitalizations for pain. Reports that his symptoms started on 04/23/2013 around 1 pm. He says that he has been taking his home pain medications and not able to get his pain under reasonable control so he called the day hospital late yesterday afternoon. He reports that he woke up this morning at 6am with 8/10 pain  Intensity described as throbbing, constant, and non-radiating unrelieved by current medication regimen. Patient attributes current symptoms to fluctuations in weather. He takes MS contin twice daily for long-acting pain control in addition to oral dilaudid which he takes for acute pain events. Patient admits that he uses marijuana occasionally, but denies smoking cigarettes. He denies chest pain,  shortness of breath, dizziness, N/VD, and headache.  Meds: Prescriptions prior to admission  Medication Sig Dispense Refill  . celecoxib (CELEBREX) 200 MG capsule Take 1 capsule (200 mg total) by mouth 2 (two) times daily.  60 capsule  1  . folic acid (FOLVITE) 1 MG tablet Take 1 tablet (1 mg total) by mouth every morning.  30 tablet  11  . HYDROmorphone (DILAUDID) 4 MG tablet Take 1 tablet (4 mg total) by mouth every 4 (four) hours as needed for severe pain.  90 tablet  0  . hydroxyurea (HYDREA) 500 MG capsule Take 1  capsule (500 mg total) by mouth 2 (two) times daily with a meal. May take with food to minimize GI side effects.  60 capsule  0  . morphine (MS CONTIN) 15 MG 12 hr tablet Take 1 tablet (15 mg total) by mouth 2 (two) times daily.  60 tablet  0  . potassium chloride SA (K-DUR,KLOR-CON) 20 MEQ tablet Take 1 tablet (20 mEq total) by mouth every morning.  30 tablet  3  . Rivaroxaban (XARELTO) 20 MG TABS tablet Take 20 mg by mouth daily with supper.      . zolpidem (AMBIEN) 10 MG tablet Take 1 tablet (10 mg total) by mouth at bedtime as needed for sleep.  30 tablet  2  . ergocalciferol (VITAMIN D2) 50000 UNITS capsule Take 1 capsule (50,000 Units total) by mouth once a week.  4 capsule  12    Allergies: Morphine and related Past Medical History  Diagnosis Date  . Sickle cell anemia   . Blood transfusion   . 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 Hypokalemia   . Mood disorder   . Pulmonary embolism   . Avascular necrosis   . Leukocytosis     Chronic  . Thrombocytosis     Chronic  . Hypertension   . C. difficile colitis   . Uses marijuana   . Chronic anticoagulation   . Functional asplenia    Past Surgical History  Procedure Laterality Date  . Right hip replacement      08/2006  . Cholecystectomy  01/2008  . Porta cath placement    . Porta cath removal    . Umbilical hernia repair      01/2008  . Excision of left periauricular cyst      10/2009  . Excision of right ear lobe cyst with primary closur      11/2007  . Portacath placement  01/05/2012    Procedure: INSERTION PORT-A-CATH;  Surgeon: Odis Hollingshead, MD;  Location: [place];  Service: General;  Laterality: N/A;  ultrasound guiced port a cath insertion with fluoroscopy   Family History  Problem Relation Age of Onset  . Sickle cell anemia Mother   . Sickle cell anemia Father   . Sickle cell trait Brother    History   Social History  . Marital Status: Single    Spouse Name: N/A    Number  of Children: 0  . Years of Education: 13   Occupational History  . 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 Smoking status: Former Smoker -- 13 years    Types: Cigarettes    Quit date: 07/08/2010  . Smokeless tobacco: Never Used  . Alcohol Use: 6.0 oz/week    10 Shots of liquor per week  . Drug Use: Yes    Special: Marijuana     Comment: quit smoking 2011  . Sexual Activity: Yes    Partners: Female    Patent examiner Protection: None   Other Topics Concern  . Not on file   Social History Narrative   Lives in an apartment.  Single.  Lives alone but has a girlfriend that helps care for him.  Does not use any assist devices.        [name]:  [phone] Mom, emergency contact    Review of Systems: Constitutional: No weight loss, night sweats, Fevers, chills, fatigue.  HEENT: No headaches, dizziness, seizures, vision changes, difficulty swallowing,Tooth/dental problems,Sore throat, No sneezing, itching, ear ache, nasal congestion, post nasal drip,  Cardio-vascular: No chest pain, Orthopnea, PND, swelling in lower extremities, anasarca, dizziness, palpitations  GI: No heartburn, indigestion, abdominal pain, nausea, diarrhea, change in bowel habits, loss of appetite  Resp: No shortness of breath with exertion or at rest. No excess mucus, no productive cough, No non-productive cough, No coughing up of blood.No change in color of mucus.No wheezing.No chest wall deformity  Skin: no rash or lesions. Mild pruritus.  GU: no dysuria, change in color of urine, no urgency or frequency. No flank pain.  Musculoskeletal: No joint pain or swelling. No decreased range of motion. No back pain. Rib cage pain. Psych: No depression or anxiety. No memory loss.    Physical Exam: Blood pressure 109/76, pulse 84, temperature 98.6 F (37 C), resp. rate 18, SpO2 98.00%. General appearance: alert, cooperative, icteric and mild distress   Head: Normocephalic, without obvious abnormality, atraumatic  Eyes: negative findings: lids and lashes normal and pupils equal, round, reactive to light and accomodation, positive findings: sclera icteric bilateral  Nose: Nares normal. Septum midline. Mucosa normal. No drainage or sinus tenderness., no discharge  Throat: abnormal findings: dry, pale mucus membranes  Neck: no adenopathy, no carotid bruit, no JVD and supple, symmetrical, trachea midline  Lungs: clear to auscultation bilaterally and normal percussion bilaterally  Chest wall: right sided costochondral tenderness, left sided costochondral tenderness  Heart: S1, S2 normal  Abdomen: soft, non-tender; bowel sounds normal; no masses, no organomegaly  Extremities: extremities  normal, atraumatic, no cyanosis or edema  Pulses: 2+ and symmetric  Skin: Skin color, texture, turgor normal. No rashes or lesions  Neurologic: Alert and oriented X 3, normal strength and tone. Normal symmetric reflexes. Normal coordination and gait     Lab results: No results found for this or any previous visit (from the past 24 hour(s)).  Imaging results:  No results found.   Assessment & Plan:  Sickle cell anemia with pain:  Admit to extended sickle cell day hospital for extended observation. Initiate weight based rapid re-dosing protocol of IV dilaudid. Monitor closely. If pain intensity remains greater than 7/10, will transition to weight based dilaudid PCA.  Order K pad Initiate IV analgesics. IV Toradol X 1 dose.  Starting hypotonic  IVFs.  Will check CBC w/ diff and complete metabolic panel. Patient was found to be hypokalemic on 04/22/2013. Patient has taken all home medications.  Ordered medications for pruritis      Jordan Caraveo M 04/24/2013, 12:14 PM

## 2013-04-25 IMAGING — US US ABDOMEN COMPLETE
1 series · 13 of 25 positions shown · non-contrast
Comparison: CT abdomen pelvis 06/01/2009 and ultrasound abdomen
01/09/2008.

CLINICAL DATA: Sickle cell, gallbladder removed.  And [DATE] common
bile duct dilatation.

COMPLETE ABDOMINAL ULTRASOUND

[Series 1: us abdomen complete · 0.32mm/px · 13 of 218 slices shown]
[im 1/218]
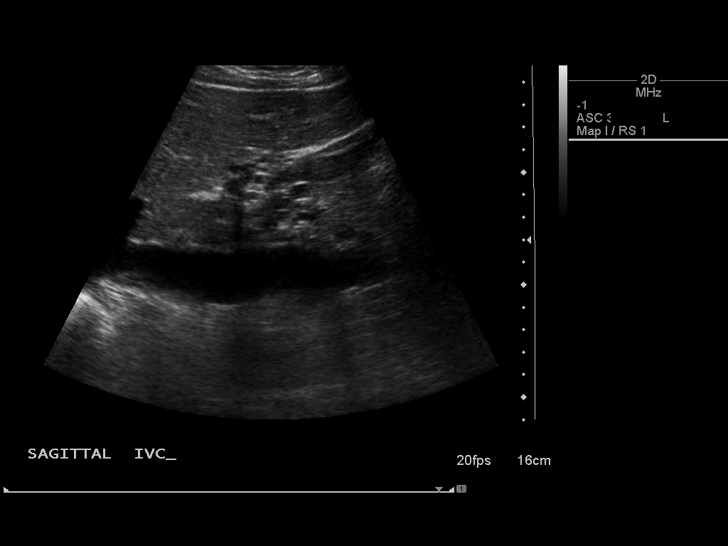
[im 19/218]
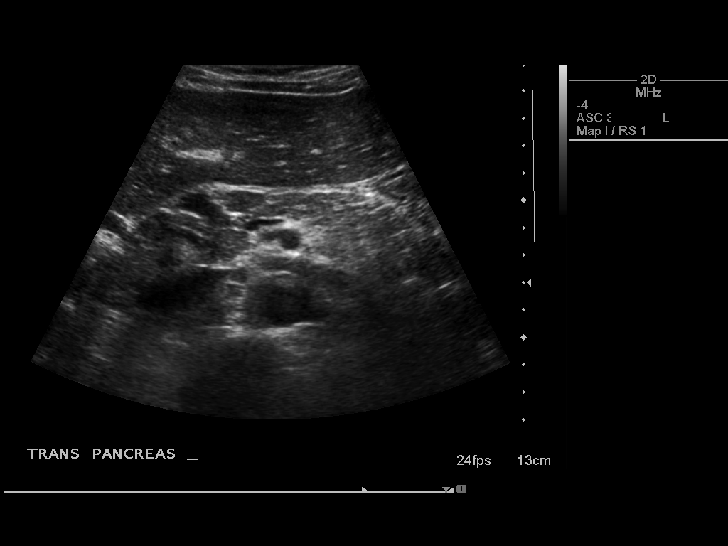
[im 37/218]
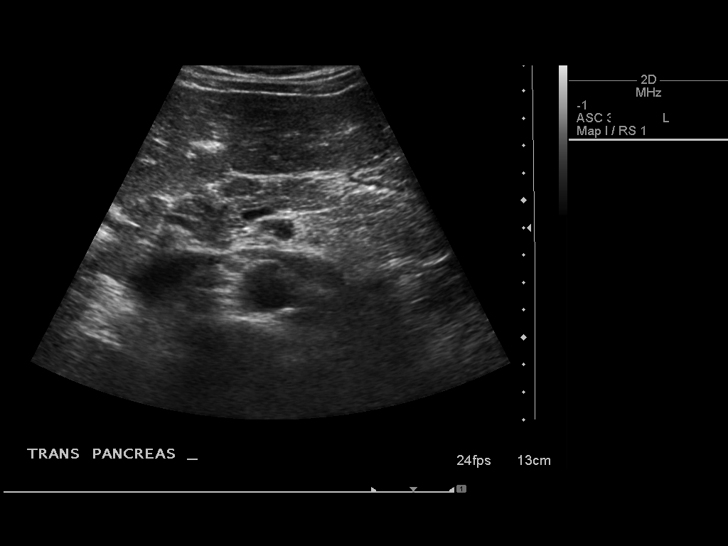
[im 55/218]
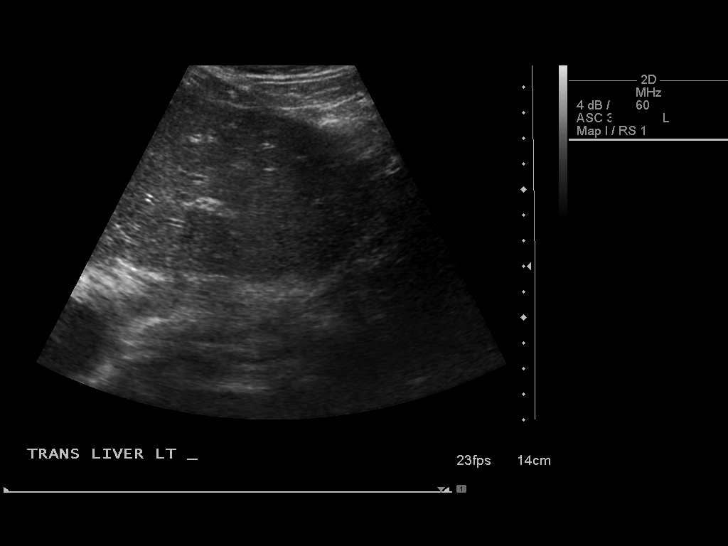
[im 73/218]
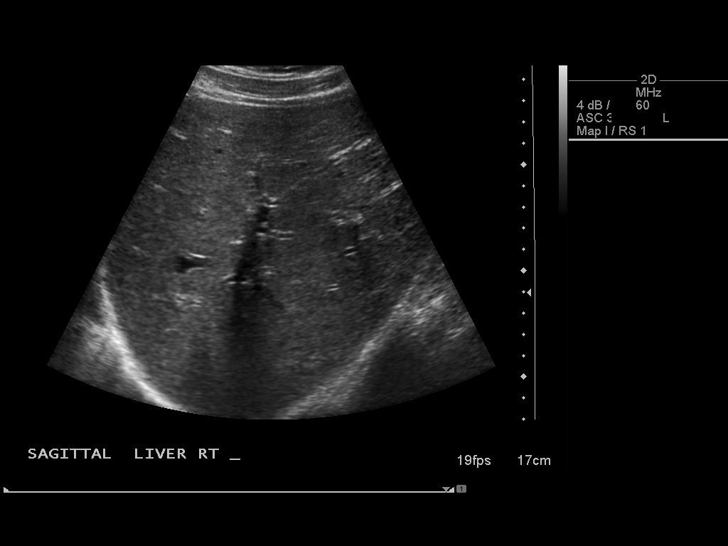
[im 91/218]
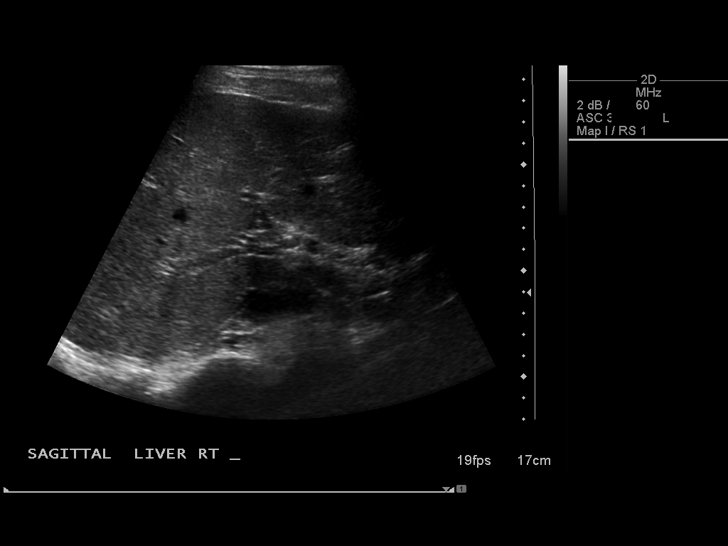
[im 109/218]
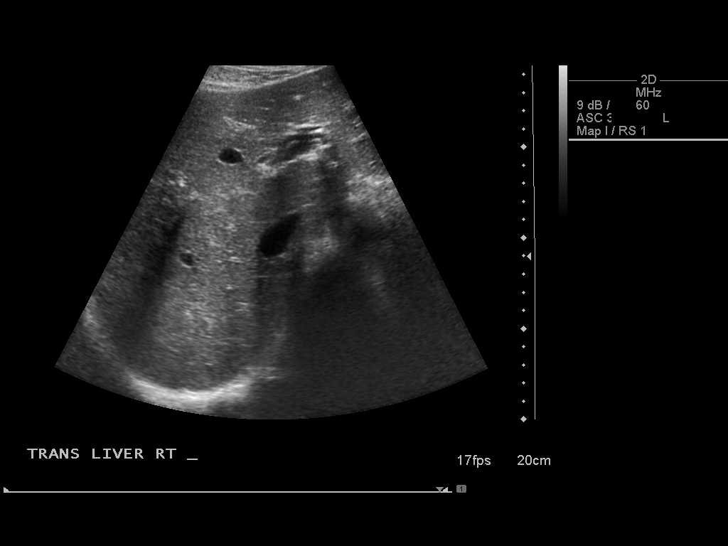
[im 127/218]
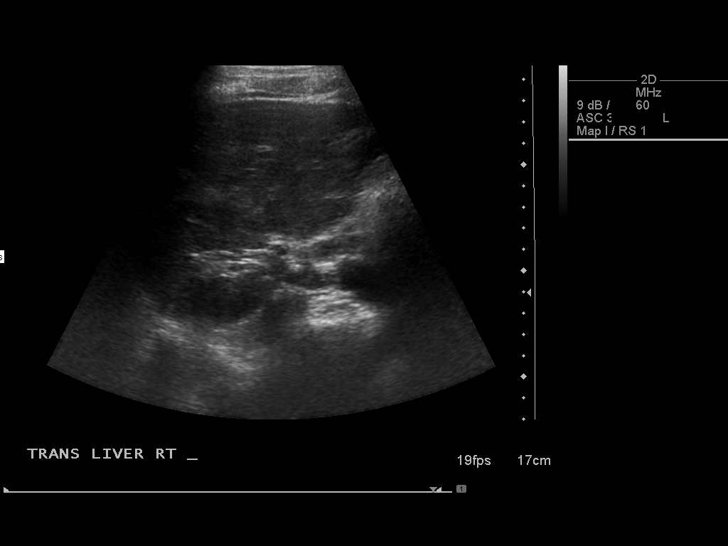
[im 145/218]
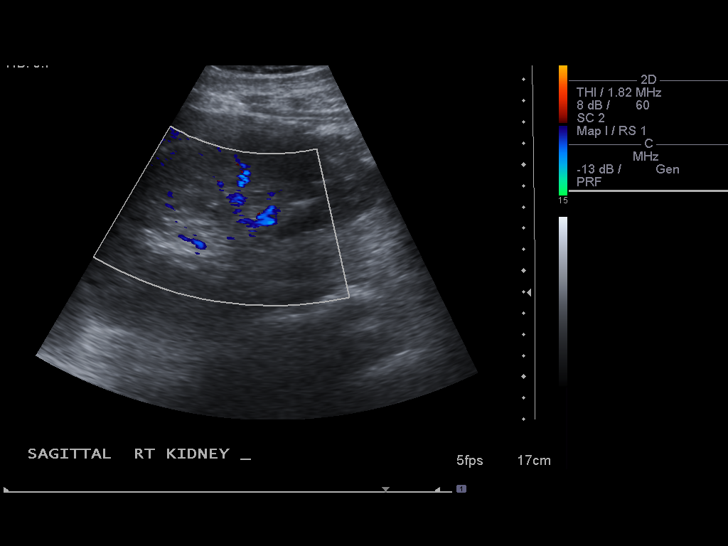
[im 163/218]
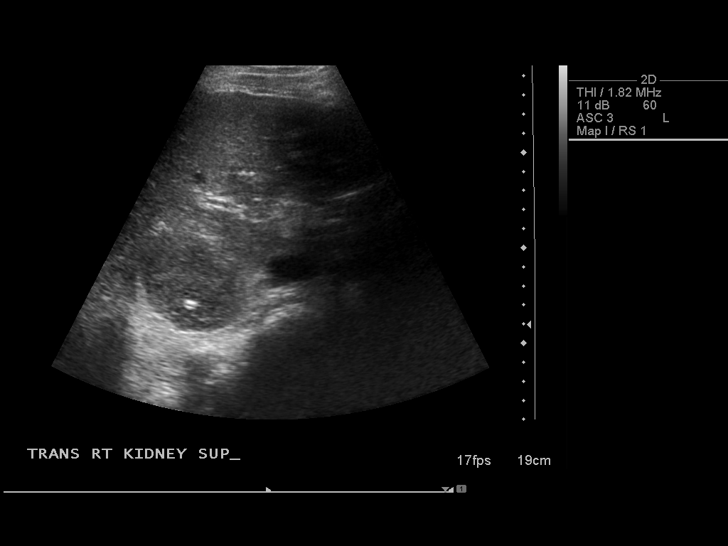
[im 181/218]
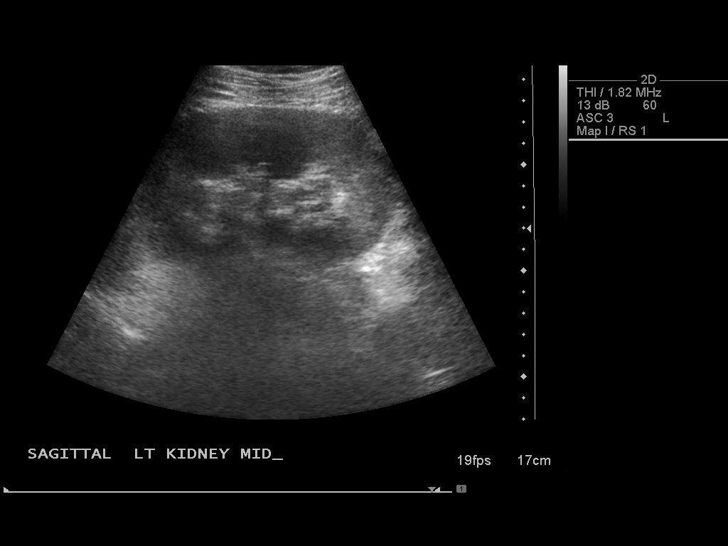
[im 199/218]
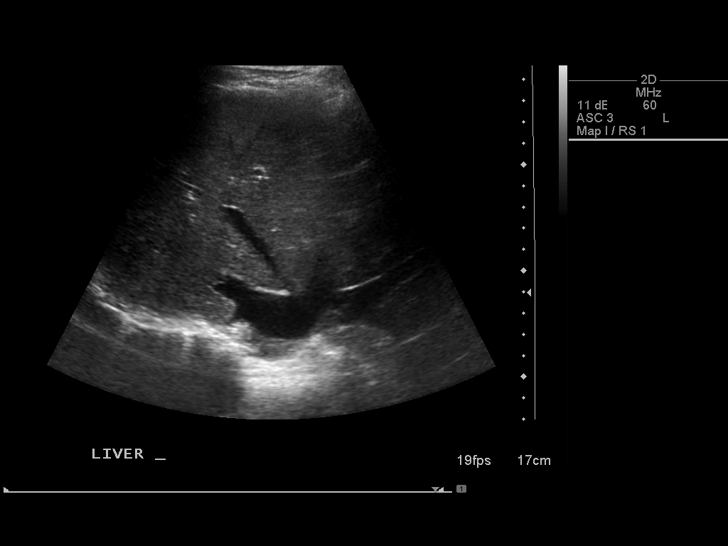
[im 218/218]
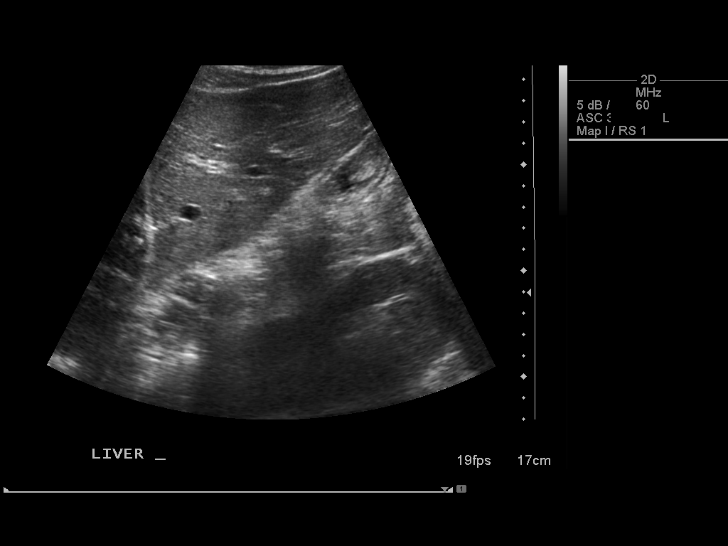

[13 of 25 positions shown; findings below may reference images not displayed]

FINDINGS: Gallbladder:  Surgically absent.

Common bile duct:  Measures 6 mm, within normal limits.

Liver:  Uniform in parenchymal echo texture.

IVC:  Visualized.

Pancreas:  A structure isoechoic to the pancreas is seen adjacent
to the pancreatic neck / body, measuring approximately 2.5 x 0.7 x
1.6 cm.

Spleen:  Not visualized due to lateral infarction.

Right Kidney:  Measures 15.3 cm with normal parenchymal echo
texture.  A 1.3 x 0.6 x 0.8 cm echogenic structure is seen in the
superior aspect of the right kidney, and is not well correlated on
cross-sectional imaging performed 06/01/2009.  No hydronephrosis.

Left Kidney:  Measures 12.3 cm with normal parenchymal echo
texture.  No hydronephrosis.

Abdominal aorta:  Distal abdominal aorta is obscured by bowel gas.
Otherwise negative.
IMPRESSION: 1.  Common bile duct is normal in caliber.
2.  Possible lymph node adjacent to the pancreas.
3.  Echogenic structure visualized in the upper pole right kidney
is not well correlated on cross-sectional imaging performed earlier
the same day.

## 2013-04-25 NOTE — H&P (Signed)
Patient seen and examined. Agree with above note by PA.

## 2013-04-25 NOTE — Discharge Summary (Signed)
Patient is seen and examined. Agree with above note by PA.

## 2013-04-28 IMAGING — CR DG CHEST 2V
2 series · 2 of 2 positions shown · non-contrast
Comparison: 06/01/2010

CLINICAL DATA: Sickle cell crisis

CHEST - 2 VIEW

[w chest pa]
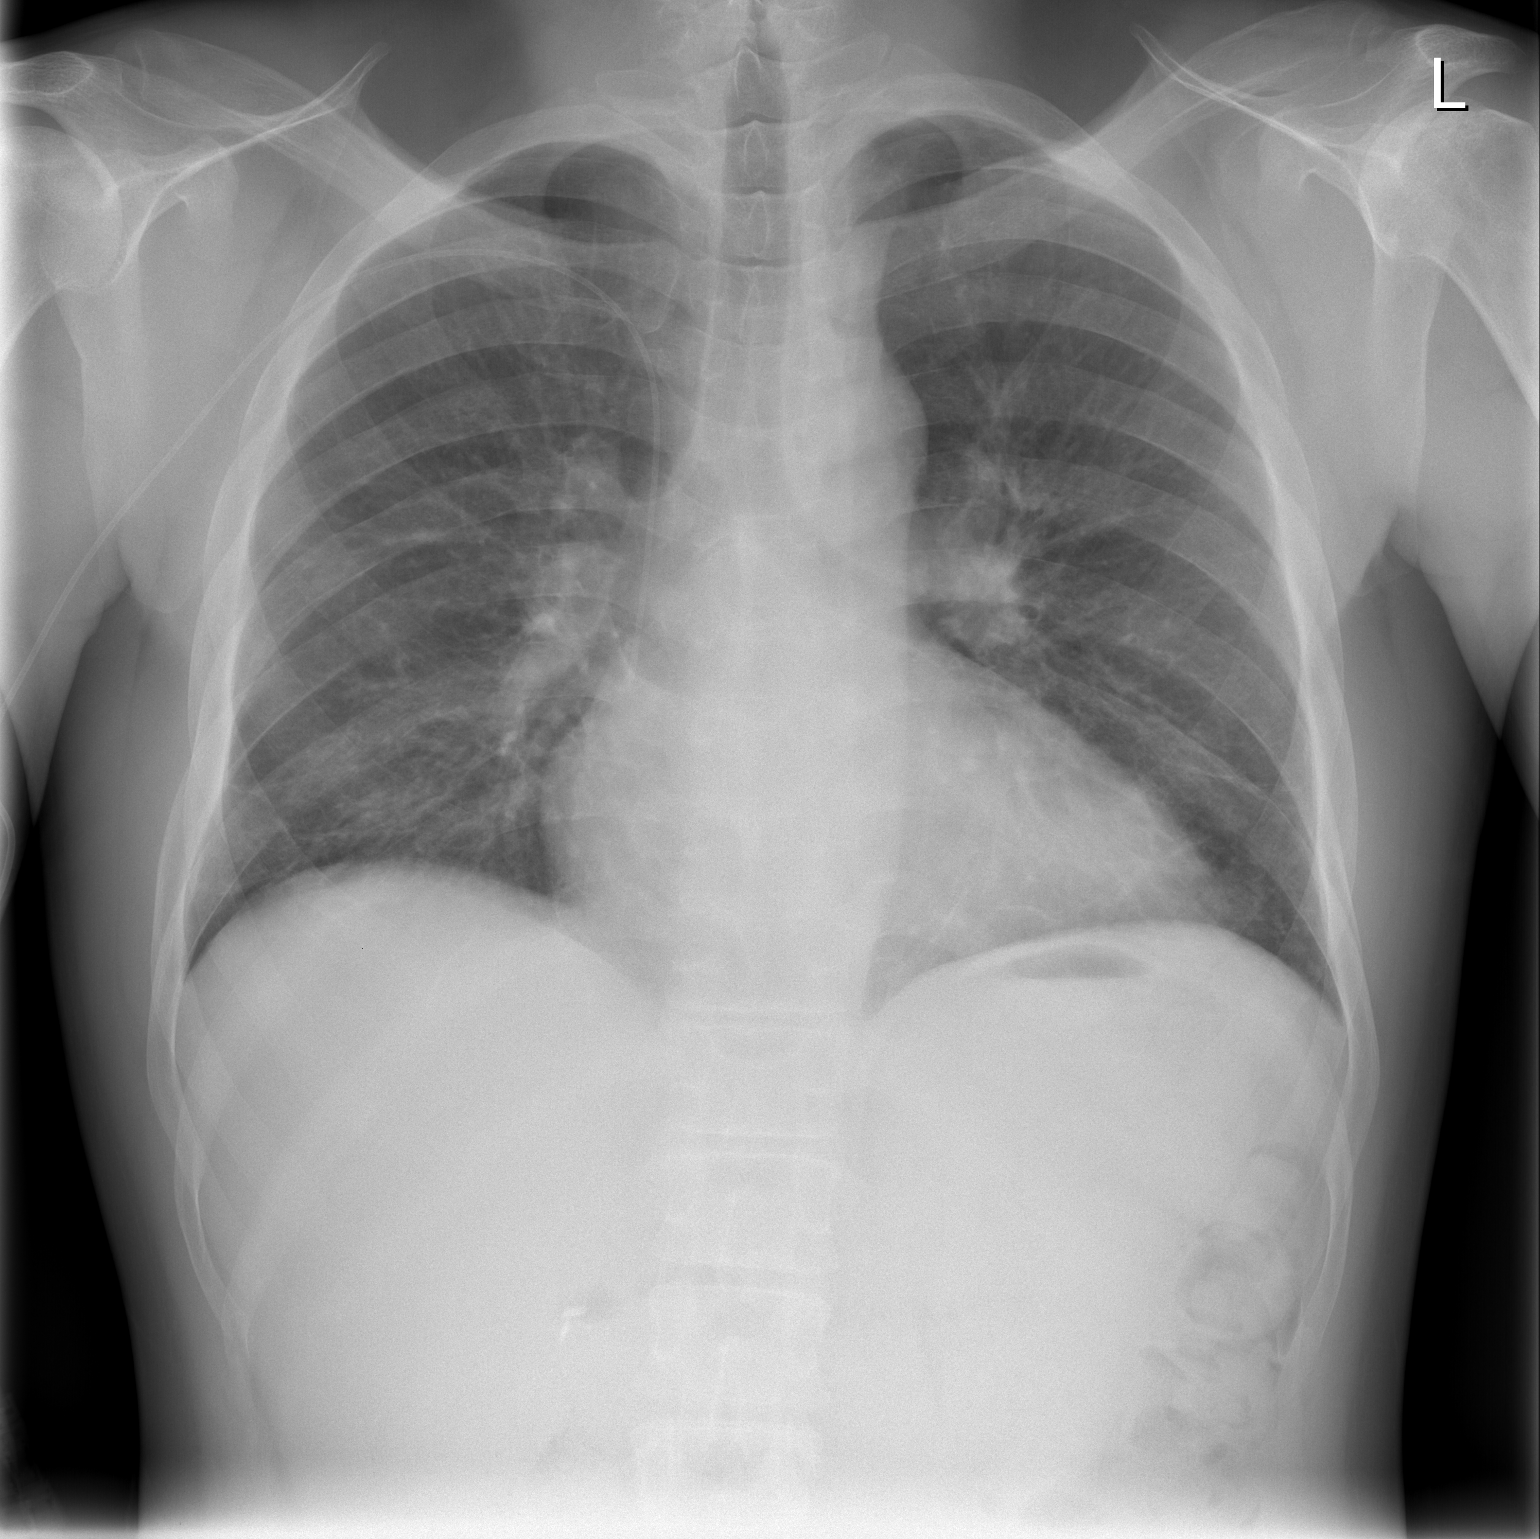

[w chest lat]
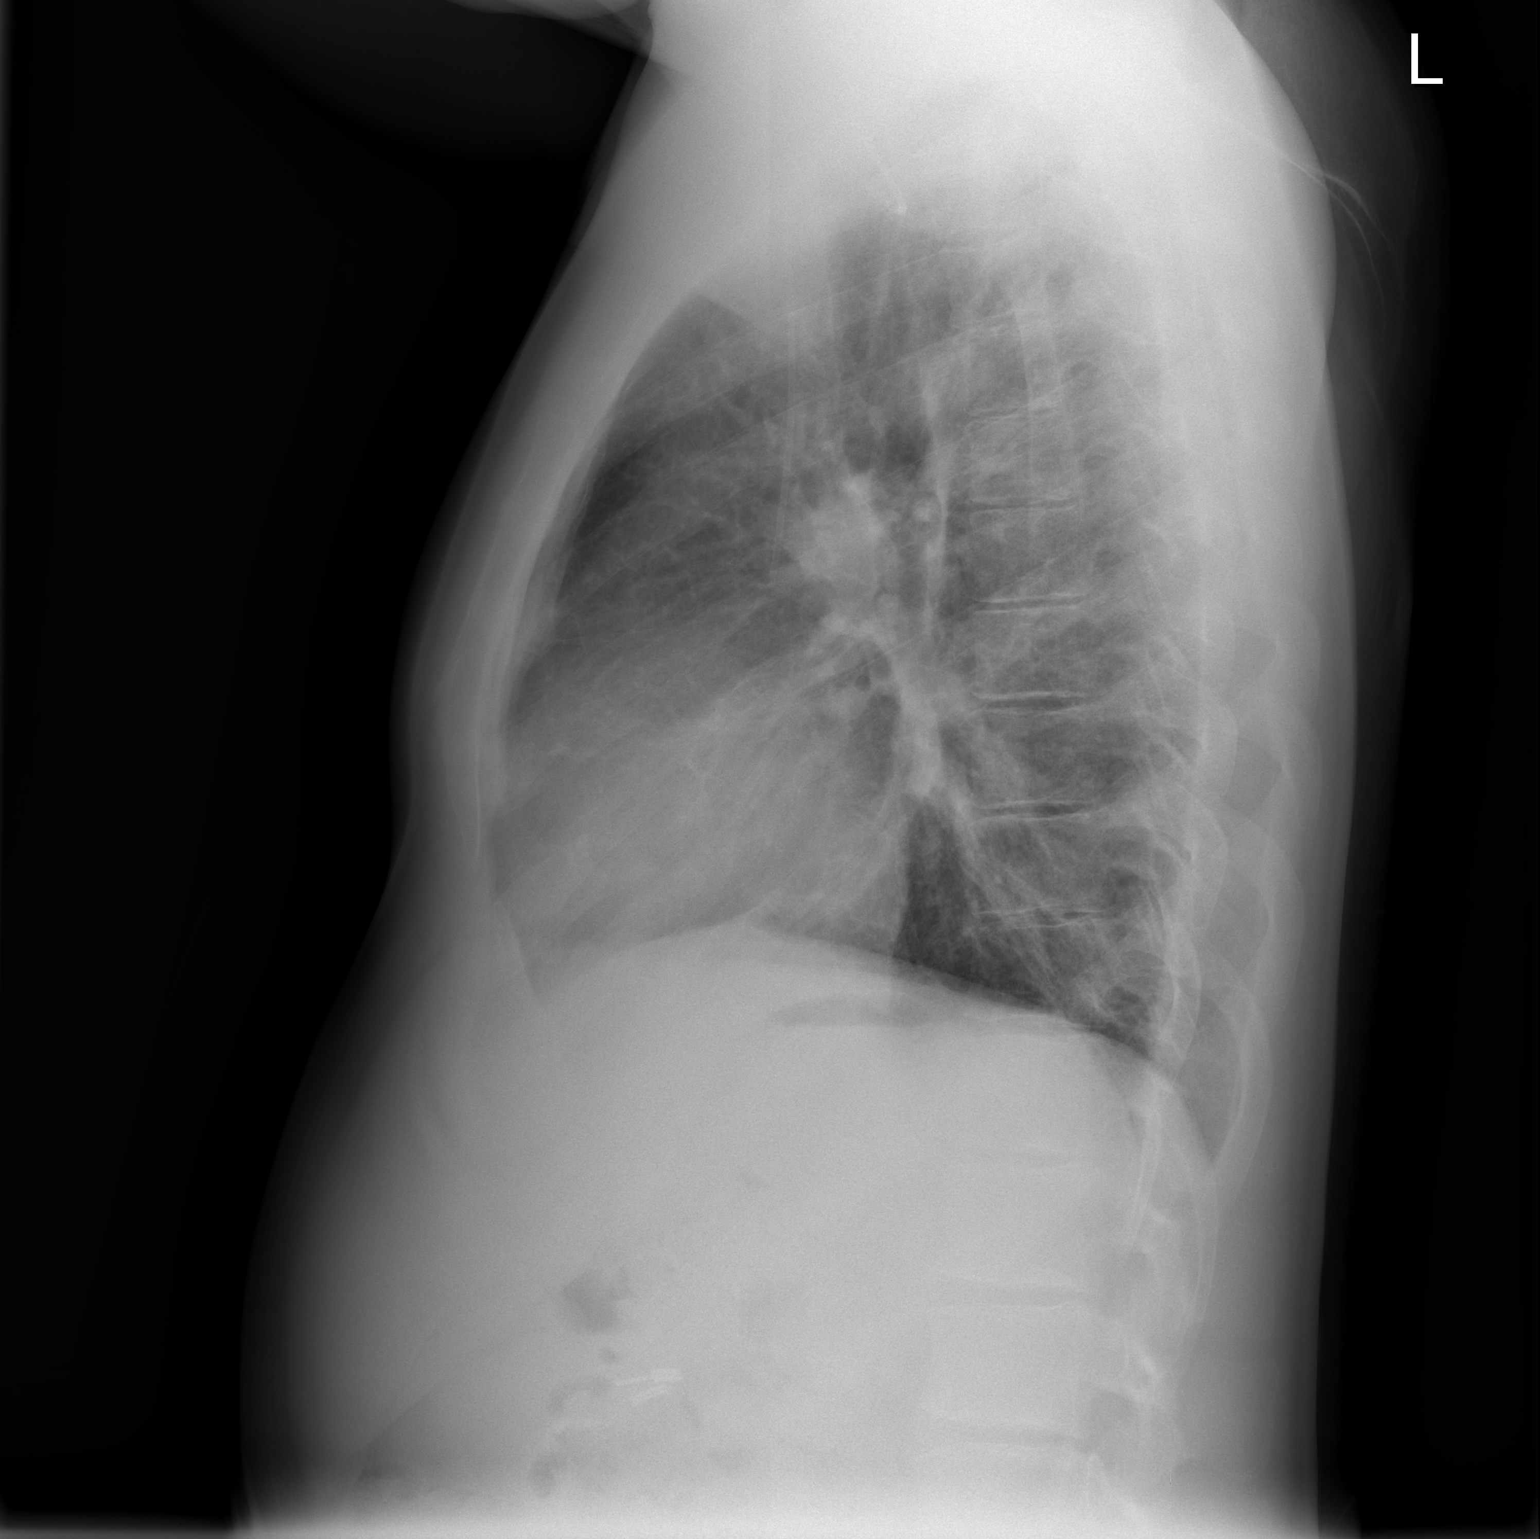

[2 of 2 positions shown; findings below may reference images not displayed]

FINDINGS: There is a right arm PICC line with tip in the cavoatrial
junction.

Stable mild cardiac enlargement.

Coarsened interstitial markings and bibasilar scarring is similar
to prior exam.

No focal airspace consolidation noted.
IMPRESSION: 1.  No acute findings.
2.  Interstitial coarsening and bibasilar scarring.

## 2013-05-02 IMAGING — CR DG SHOULDER 2+V*L*
4 series · 4 of 4 positions shown · non-contrast
Comparison: 03/08/2010

CLINICAL DATA: The left shoulder pain - no known trauma

LEFT SHOULDER - 2+ VIEW

[w shoulder ap internal left]
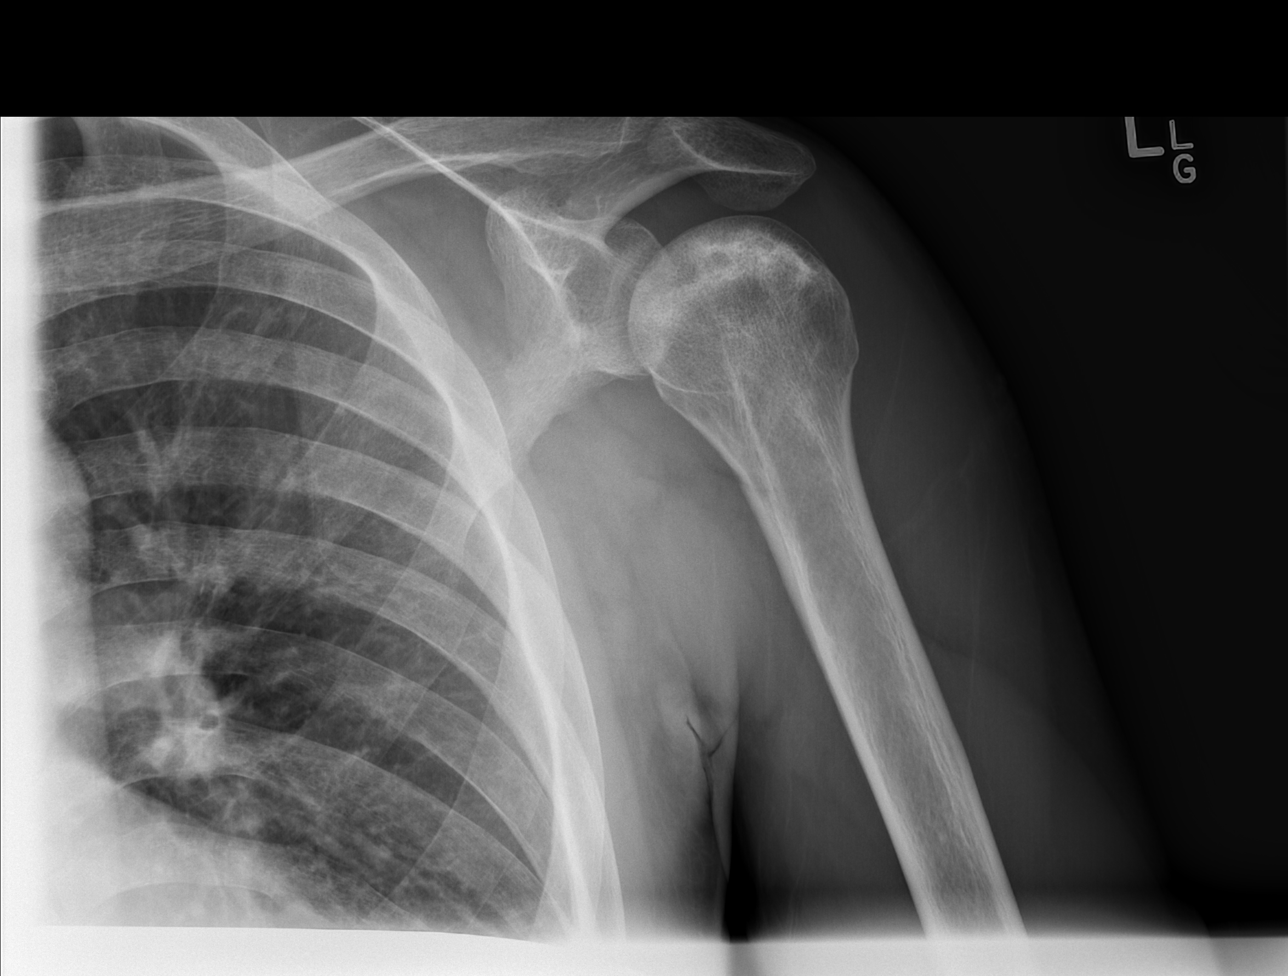

[w shoulder ap external left]
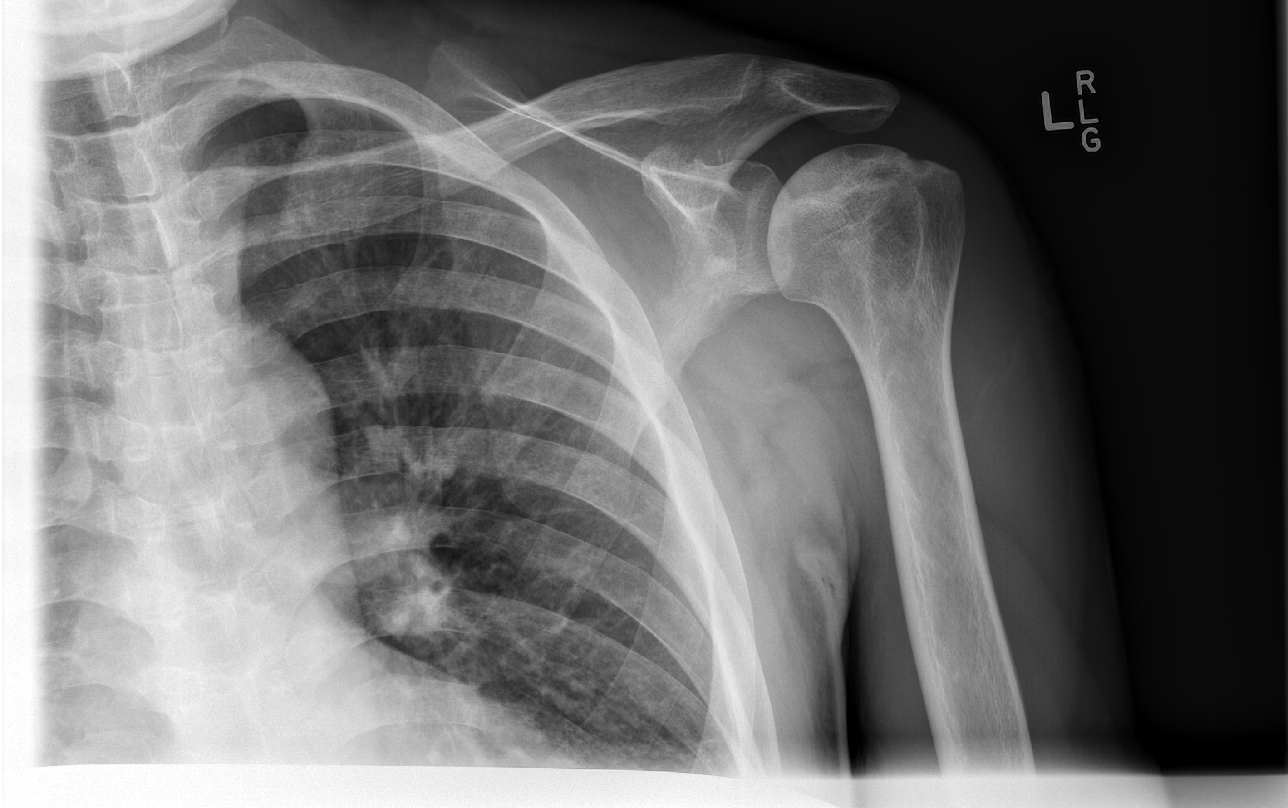

[w shoulder y view left]
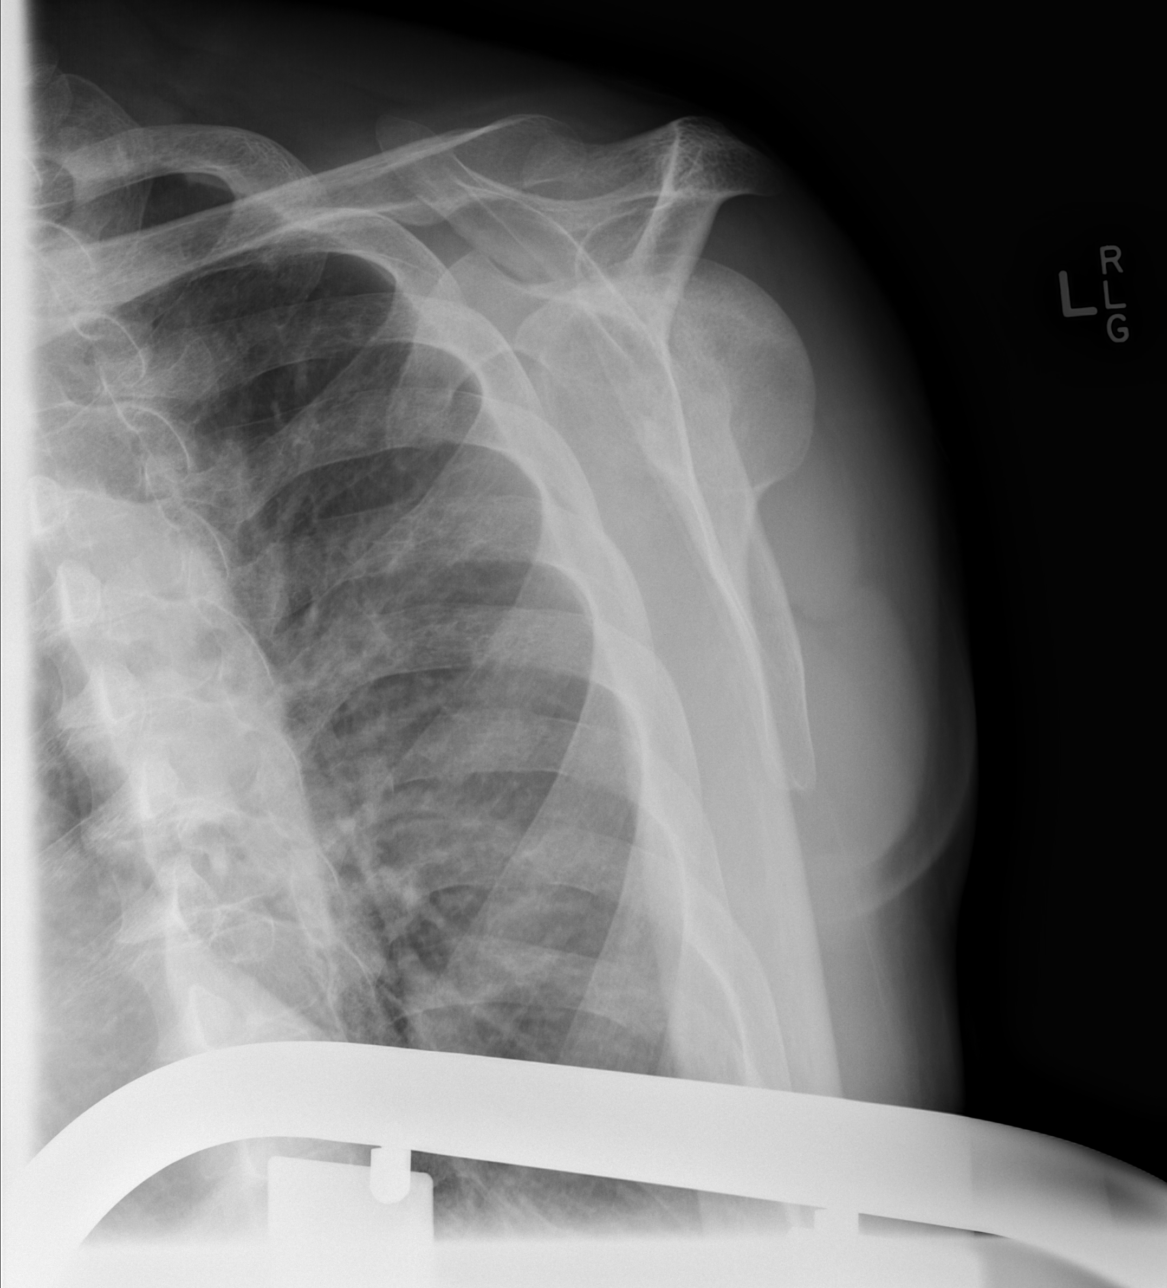

[w shoulder axillary left *]
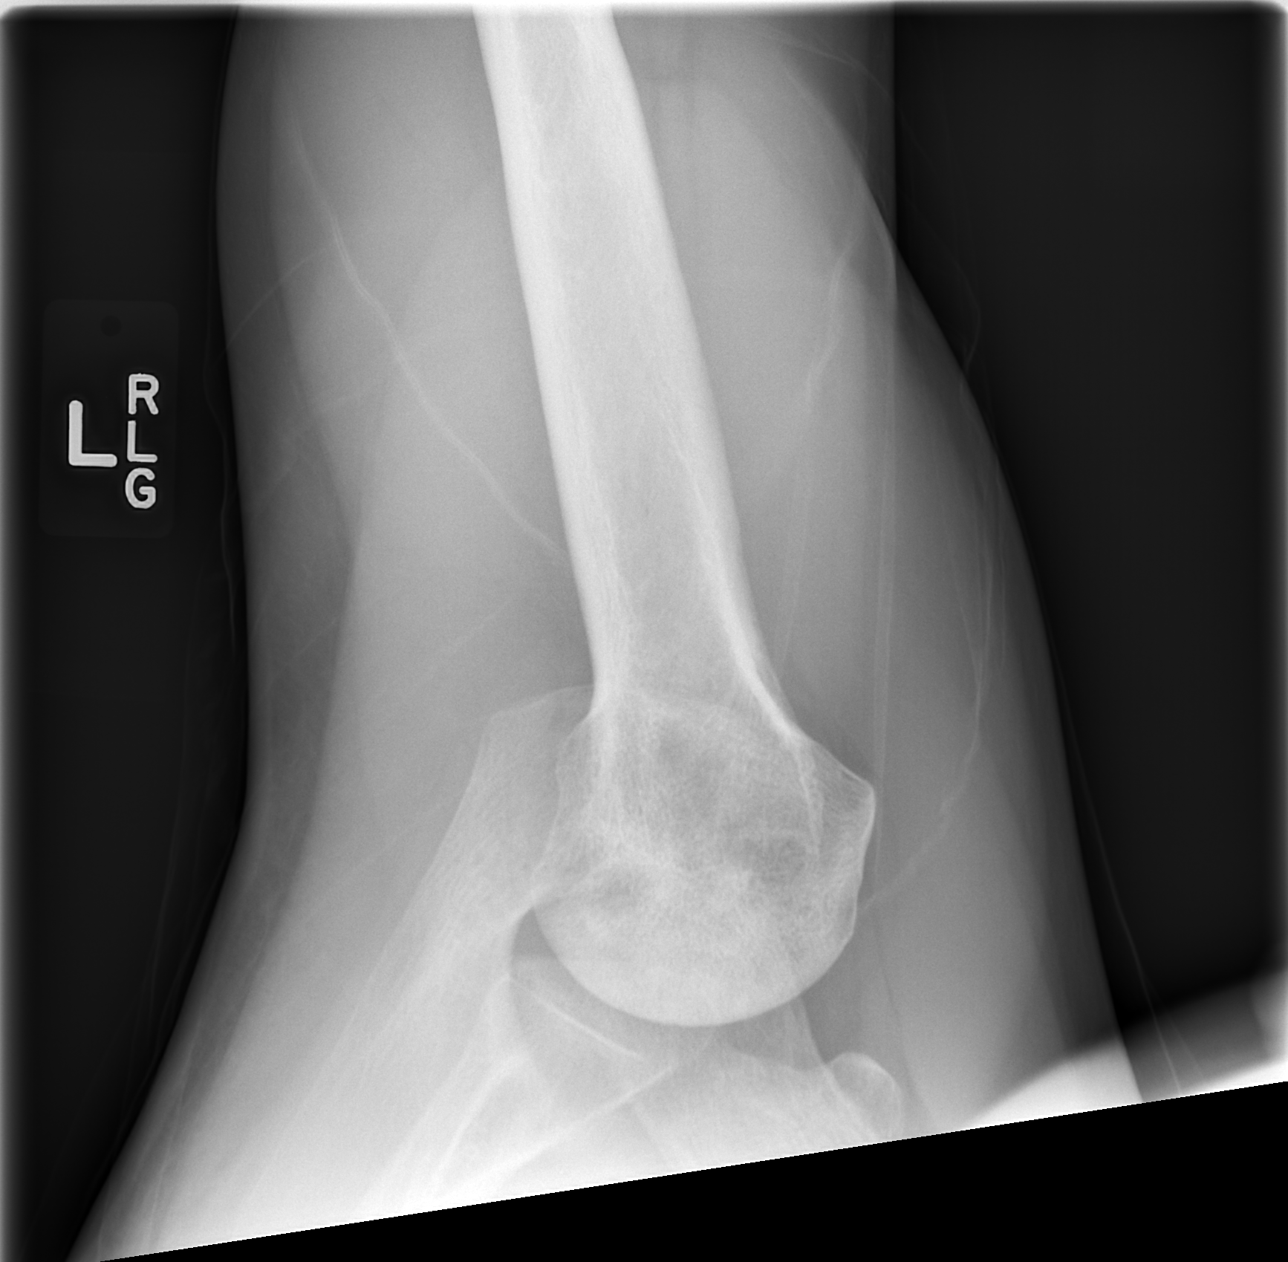

[4 of 4 positions shown; findings below may reference images not displayed]

FINDINGS: Cystic changes in the humeral head are again noted,
consistent with avascular necrosis.  Otherwise normal.
IMPRESSION: Stable changes of avascular necrosis involving the humeral head.
Otherwise unremarkable.

## 2013-05-06 ENCOUNTER — Telehealth (HOSPITAL_COMMUNITY): Payer: Self-pay | Admitting: *Deleted

## 2013-05-06 ENCOUNTER — Encounter (HOSPITAL_COMMUNITY): Payer: Self-pay

## 2013-05-06 ENCOUNTER — Non-Acute Institutional Stay (HOSPITAL_COMMUNITY)
Admission: AD | Admit: 2013-05-06 | Discharge: 2013-05-06 | Disposition: A | Discharge status: Home / Self Care | Payer: Medicare Other | Attending: Family Medicine | Admitting: Family Medicine

## 2013-05-06 DIAGNOSIS — Z86718 Personal history of other venous thrombosis and embolism: Secondary | ICD-10-CM | POA: Insufficient documentation

## 2013-05-06 DIAGNOSIS — Z87891 Personal history of nicotine dependence: Secondary | ICD-10-CM

## 2013-05-06 DIAGNOSIS — D75839 Thrombocytosis, unspecified: Secondary | ICD-10-CM | POA: Diagnosis present

## 2013-05-06 DIAGNOSIS — R0789 Other chest pain: Secondary | ICD-10-CM | POA: Diagnosis present

## 2013-05-06 DIAGNOSIS — Z7722 Contact with and (suspected) exposure to environmental tobacco smoke (acute) (chronic): Secondary | ICD-10-CM

## 2013-05-06 DIAGNOSIS — F1290 Cannabis use, unspecified, uncomplicated: Secondary | ICD-10-CM | POA: Diagnosis present

## 2013-05-06 DIAGNOSIS — E876 Hypokalemia: Secondary | ICD-10-CM | POA: Diagnosis present

## 2013-05-06 DIAGNOSIS — Z7901 Long term (current) use of anticoagulants: Secondary | ICD-10-CM | POA: Diagnosis not present

## 2013-05-06 DIAGNOSIS — R0781 Pleurodynia: Secondary | ICD-10-CM | POA: Diagnosis present

## 2013-05-06 DIAGNOSIS — D72829 Elevated white blood cell count, unspecified: Secondary | ICD-10-CM

## 2013-05-06 DIAGNOSIS — D5700 Hb-SS disease with crisis, unspecified: Secondary | ICD-10-CM | POA: Diagnosis not present

## 2013-05-06 DIAGNOSIS — R17 Unspecified jaundice: Secondary | ICD-10-CM

## 2013-05-06 DIAGNOSIS — E559 Vitamin D deficiency, unspecified: Secondary | ICD-10-CM | POA: Insufficient documentation

## 2013-05-06 DIAGNOSIS — E860 Dehydration: Secondary | ICD-10-CM | POA: Diagnosis present

## 2013-05-06 DIAGNOSIS — Q8909 Congenital malformations of spleen: Secondary | ICD-10-CM | POA: Insufficient documentation

## 2013-05-06 DIAGNOSIS — D473 Essential (hemorrhagic) thrombocythemia: Secondary | ICD-10-CM | POA: Diagnosis present

## 2013-05-06 DIAGNOSIS — Z86711 Personal history of pulmonary embolism: Secondary | ICD-10-CM | POA: Insufficient documentation

## 2013-05-06 DIAGNOSIS — Z79899 Other long term (current) drug therapy: Secondary | ICD-10-CM | POA: Insufficient documentation

## 2013-05-06 DIAGNOSIS — Q8901 Asplenia (congenital): Secondary | ICD-10-CM

## 2013-05-06 DIAGNOSIS — F1210 Cannabis abuse, uncomplicated: Secondary | ICD-10-CM | POA: Insufficient documentation

## 2013-05-06 DIAGNOSIS — I10 Essential (primary) hypertension: Secondary | ICD-10-CM | POA: Insufficient documentation

## 2013-05-06 DIAGNOSIS — Z789 Other specified health status: Secondary | ICD-10-CM

## 2013-05-06 DIAGNOSIS — D730 Hyposplenism: Secondary | ICD-10-CM

## 2013-05-06 DIAGNOSIS — R079 Chest pain, unspecified: Secondary | ICD-10-CM | POA: Insufficient documentation

## 2013-05-06 DIAGNOSIS — Z9089 Acquired absence of other organs: Secondary | ICD-10-CM | POA: Insufficient documentation

## 2013-05-06 DIAGNOSIS — Z96649 Presence of unspecified artificial hip joint: Secondary | ICD-10-CM | POA: Insufficient documentation

## 2013-05-06 HISTORY — DX: Personal history of other infectious and parasitic diseases: Z86.19

## 2013-05-06 HISTORY — DX: Personal history of pulmonary embolism: Z86.711

## 2013-05-06 HISTORY — DX: Other specified health status: Z78.9

## 2013-05-06 HISTORY — DX: Personal history of nicotine dependence: Z87.891

## 2013-05-06 HISTORY — DX: Contact with and (suspected) exposure to environmental tobacco smoke (acute) (chronic): Z77.22

## 2013-05-06 LAB — URINE MICROSCOPIC-ADD ON

## 2013-05-06 LAB — CBC WITH DIFFERENTIAL/PLATELET
BASOS ABS: 0.2 10*3/uL — AB (ref 0.0–0.1)
Basophils Relative: 1 % (ref 0–1)
EOS ABS: 0.4 10*3/uL (ref 0.0–0.7)
Eosinophils Relative: 2 % (ref 0–5)
HCT: 22.1 % — ABNORMAL LOW (ref 39.0–52.0)
Hemoglobin: 7.7 g/dL — ABNORMAL LOW (ref 13.0–17.0)
LYMPHS PCT: 10 % — AB (ref 12–46)
Lymphs Abs: 1.9 10*3/uL (ref 0.7–4.0)
MCH: 32.2 pg (ref 26.0–34.0)
MCHC: 34.8 g/dL (ref 30.0–36.0)
MCV: 92.5 fL (ref 78.0–100.0)
Monocytes Absolute: 2.3 10*3/uL — ABNORMAL HIGH (ref 0.1–1.0)
Monocytes Relative: 12 % (ref 3–12)
NEUTROS PCT: 75 % (ref 43–77)
Neutro Abs: 14.1 10*3/uL — ABNORMAL HIGH (ref 1.7–7.7)
PLATELETS: 470 10*3/uL — AB (ref 150–400)
RBC: 2.39 MIL/uL — ABNORMAL LOW (ref 4.22–5.81)
RDW: 22.8 % — AB (ref 11.5–15.5)
WBC: 18.9 10*3/uL — ABNORMAL HIGH (ref 4.0–10.5)

## 2013-05-06 LAB — COMPREHENSIVE METABOLIC PANEL
ALT: 20 U/L (ref 0–53)
AST: 41 U/L — ABNORMAL HIGH (ref 0–37)
Albumin: 4.1 g/dL (ref 3.5–5.2)
Alkaline Phosphatase: 92 U/L (ref 39–117)
BUN: 5 mg/dL — ABNORMAL LOW (ref 6–23)
CO2: 21 meq/L (ref 19–32)
Calcium: 9.3 mg/dL (ref 8.4–10.5)
Chloride: 104 mEq/L (ref 96–112)
Creatinine, Ser: 0.53 mg/dL (ref 0.50–1.35)
GFR calc Af Amer: >90 mL/min (ref >90)
GLUCOSE: 80 mg/dL (ref 70–99)
Potassium: 3 mEq/L — ABNORMAL LOW (ref 3.7–5.3)
SODIUM: 140 meq/L (ref 137–147)
Total Bilirubin: 6.4 mg/dL — ABNORMAL HIGH (ref 0.3–1.2)
Total Protein: 7.4 g/dL (ref 6.0–8.3)

## 2013-05-06 LAB — URINALYSIS, ROUTINE W REFLEX MICROSCOPIC
Bilirubin Urine: NEGATIVE
GLUCOSE, UA: NEGATIVE mg/dL
HGB URINE DIPSTICK: NEGATIVE
Ketones, ur: NEGATIVE mg/dL
Leukocytes, UA: NEGATIVE
Nitrite: NEGATIVE
PROTEIN: 30 mg/dL — AB
Specific Gravity, Urine: 1.013 (ref 1.005–1.030)
Urobilinogen, UA: 1 mg/dL (ref 0.0–1.0)
pH: 7 (ref 5.0–8.0)

## 2013-05-06 LAB — RETICULOCYTES: RBC.: 2.39 MIL/uL — ABNORMAL LOW (ref 4.22–5.81)

## 2013-05-06 LAB — MAGNESIUM: Magnesium: 1.6 mg/dL (ref 1.5–2.5)

## 2013-05-06 LAB — POTASSIUM: Potassium: 3.6 mEq/L — ABNORMAL LOW (ref 3.7–5.3)

## 2013-05-06 MED ORDER — HEPARIN SOD (PORK) LOCK FLUSH 100 UNIT/ML IV SOLN
500.0000 [IU] | INTRAVENOUS | Status: AC | PRN
  Administered 2013-05-06: 500 [IU]
  Filled 2013-05-06: qty 5

## 2013-05-06 MED ORDER — SODIUM CHLORIDE 0.9 % IJ SOLN
10.0000 mL | INTRAMUSCULAR | Status: AC | PRN
  Administered 2013-05-06: 10 mL

## 2013-05-06 MED ORDER — KETOROLAC TROMETHAMINE 30 MG/ML IJ SOLN
30.0000 mg | Freq: Once | INTRAMUSCULAR | Status: AC
  Administered 2013-05-06: 30 mg via INTRAVENOUS
  Filled 2013-05-06: qty 1

## 2013-05-06 MED ORDER — MORPHINE SULFATE ER 15 MG PO TBCR: 15.0000 mg | EXTENDED_RELEASE_TABLET | Freq: Two times a day (BID) | ORAL | Status: DC

## 2013-05-06 MED ORDER — HYDROMORPHONE HCL PF 2 MG/ML IJ SOLN
2.0000 mg | Freq: Once | INTRAMUSCULAR | Status: AC
  Administered 2013-05-06: 2 mg via INTRAVENOUS
  Filled 2013-05-06: qty 1

## 2013-05-06 MED ORDER — KCL IN DEXTROSE-NACL 10-5-0.45 MEQ/L-%-% IV SOLN
INTRAVENOUS | Status: DC
  Administered 2013-05-06: 13:00:00 via INTRAVENOUS
  Filled 2013-05-06 (×2): qty 1000

## 2013-05-06 MED ORDER — HYDROXYUREA 500 MG PO CAPS: 500.0000 mg | ORAL_CAPSULE | Freq: Two times a day (BID) | ORAL | Status: DC

## 2013-05-06 MED ORDER — NALOXONE HCL 0.4 MG/ML IJ SOLN: 0.4000 mg | INTRAMUSCULAR | Status: DC | PRN

## 2013-05-06 MED ORDER — RIVAROXABAN 20 MG PO TABS
20.0000 mg | ORAL_TABLET | Freq: Every day | ORAL | Status: DC
  Filled 2013-05-06: qty 1

## 2013-05-06 MED ORDER — DEXTROSE-NACL 5-0.45 % IV SOLN
INTRAVENOUS | Status: DC
  Administered 2013-05-06: 10:00:00 via INTRAVENOUS

## 2013-05-06 MED ORDER — DIPHENHYDRAMINE HCL 50 MG/ML IJ SOLN: 12.5000 mg | Freq: Four times a day (QID) | INTRAMUSCULAR | Status: DC | PRN

## 2013-05-06 MED ORDER — PANTOPRAZOLE SODIUM 40 MG PO TBEC
40.0000 mg | DELAYED_RELEASE_TABLET | Freq: Every day | ORAL | Status: DC
  Administered 2013-05-06: 40 mg via ORAL
  Filled 2013-05-06: qty 1

## 2013-05-06 MED ORDER — PANTOPRAZOLE SODIUM 40 MG IV SOLR: 40.0000 mg | Freq: Every day | INTRAVENOUS | Status: DC

## 2013-05-06 MED ORDER — HYDROMORPHONE 0.3 MG/ML IV SOLN
INTRAVENOUS | Status: DC
  Administered 2013-05-06: 7.19 mg via INTRAVENOUS
  Administered 2013-05-06 (×2): via INTRAVENOUS
  Administered 2013-05-06: 6.91 mg via INTRAVENOUS
  Administered 2013-05-06: 7.46 mg via INTRAVENOUS
  Administered 2013-05-06: 11:00:00 via INTRAVENOUS
  Filled 2013-05-06 (×3): qty 25

## 2013-05-06 MED ORDER — POTASSIUM CHLORIDE CRYS ER 20 MEQ PO TBCR
40.0000 meq | EXTENDED_RELEASE_TABLET | Freq: Once | ORAL | Status: AC
  Administered 2013-05-06: 40 meq via ORAL
  Filled 2013-05-06: qty 2

## 2013-05-06 MED ORDER — ONDANSETRON HCL 4 MG/2ML IJ SOLN: 4.0000 mg | Freq: Four times a day (QID) | INTRAMUSCULAR | Status: DC | PRN

## 2013-05-06 MED ORDER — HYDROMORPHONE HCL PF 2 MG/ML IJ SOLN: 3.1000 mg | Freq: Once | INTRAMUSCULAR | Status: DC | PRN

## 2013-05-06 MED ORDER — FOLIC ACID 1 MG PO TABS
1.0000 mg | ORAL_TABLET | Freq: Every morning | ORAL | Status: DC
  Administered 2013-05-06: 1 mg via ORAL
  Filled 2013-05-06: qty 1

## 2013-05-06 MED ORDER — HYDROXYZINE HCL 25 MG PO TABS: 25.0000 mg | ORAL_TABLET | ORAL | Status: DC | PRN

## 2013-05-06 MED ORDER — SENNOSIDES-DOCUSATE SODIUM 8.6-50 MG PO TABS: 1.0000 | ORAL_TABLET | Freq: Two times a day (BID) | ORAL | Status: DC | PRN

## 2013-05-06 MED ORDER — SODIUM CHLORIDE 0.9 % IJ SOLN: 9.0000 mL | INTRAMUSCULAR | Status: DC | PRN

## 2013-05-06 MED ORDER — HYDROMORPHONE HCL PF 2 MG/ML IJ SOLN
2.5000 mg | INTRAMUSCULAR | Status: AC | PRN
  Administered 2013-05-06: 2.5 mg via INTRAVENOUS
  Filled 2013-05-06: qty 2

## 2013-05-06 MED ORDER — FOLIC ACID 1 MG PO TABS: 1.0000 mg | ORAL_TABLET | Freq: Every day | ORAL | Status: DC

## 2013-05-06 MED ORDER — DIPHENHYDRAMINE HCL 12.5 MG/5ML PO ELIX
12.5000 mg | ORAL_SOLUTION | Freq: Four times a day (QID) | ORAL | Status: DC | PRN
  Administered 2013-05-06: 12.5 mg via ORAL
  Filled 2013-05-06: qty 5

## 2013-05-06 MED ORDER — HYDROMORPHONE HCL 4 MG PO TABS
4.0000 mg | ORAL_TABLET | Freq: Once | ORAL | Status: AC
  Administered 2013-05-06: 4 mg via ORAL
  Filled 2013-05-06: qty 1

## 2013-05-06 MED ORDER — POTASSIUM CHLORIDE CRYS ER 20 MEQ PO TBCR
20.0000 meq | EXTENDED_RELEASE_TABLET | Freq: Every morning | ORAL | Status: DC
  Administered 2013-05-06: 20 meq via ORAL
  Filled 2013-05-06: qty 1

## 2013-05-06 NOTE — H&P (Signed)
[place] History and Physical  [name] SEG:[id number] DOB: [birth year] DOA: 05/06/2013   PCP: [name],[name], MD   Chief Complaint: acute rib pain   HPI: [name] who has HbSS disease, history of PE on chronic anticoagulation, Vit D deficiency is presenting to the day hospital early this morning with severe acute pain in bilateral rib cage moreso on the right side 8/10 that is similar to his frequent sickle cell vaso-occlusive pain crisis episodes and this has been present for the past 3 days.  He has been celebrating recently because he has a birthday coming up. He had a bout of acute chest syndrome a couple of months ago and multiple recent hospitalizations. He has been taking his regular home pain medications and it would get his pain down to a 6 or 7 but then the pain rapidly returned.  His family has noted that his eyes have appeared more yellow than normal.   He takes MS contin twice daily for long-acting pain control in addition to oral dilaudid which he takes for acute pain events. He denies chest pain and shortness of breath. He reports that he uses marijuana to help with pain management but does not smoke cigarettes. He does report that he lives with his brother who is a heavy smoker and patient receives a lot of second hand smoke exposure.    Review of Systems:  Constitutional: No weight loss, night sweats, Fevers, chills, fatigue.  HEENT: No headaches, dizziness, seizures, vision changes, difficulty swallowing,Tooth/dental problems,Sore throat, No sneezing, itching, ear ache, nasal congestion, post nasal drip,  Cardio-vascular: No chest pain, Orthopnea, PND, swelling in lower extremities, anasarca, dizziness, palpitations  GI: No heartburn, indigestion, abdominal pain, nausea, vomiting, diarrhea, change in bowel habits, loss of appetite  Resp: No shortness of breath with exertion or at rest. No excess mucus, no productive cough, No non-productive  cough, No coughing up of blood.No change in color of mucus.No wheezing.No chest wall deformity  Skin: no rash or lesions.  GU: no dysuria, change in color of urine, no urgency or frequency. No flank pain.  Musculoskeletal: No joint pain or swelling. No decreased range of motion. No back pain.  Psych: No change in mood or affect. No depression or anxiety. No memory loss.  All other systems reviewed and reported negative  Past Medical History  Diagnosis Date  . Sickle cell anemia   . Blood transfusion   . 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 Hypokalemia   . Mood disorder   . Pulmonary embolism   . Avascular necrosis   . Leukocytosis     Chronic  . Thrombocytosis     Chronic  . Hypertension   . C. difficile colitis   . Uses marijuana   . Chronic anticoagulation   . Functional asplenia    Past Surgical History  Procedure Laterality Date  . Right hip replacement      08/2006  . Cholecystectomy      01/2008  . Porta cath placement    . Porta cath removal    . Umbilical hernia repair      01/2008  . Excision of left periauricular cyst      10/2009  . Excision of right ear lobe cyst with primary closur      11/2007  . Portacath placement  01/05/2012    Procedure: INSERTION PORT-A-CATH;  Surgeon: Odis Hollingshead, MD;  Location: [place];  Service: General;  Laterality:  N/A;  ultrasound guiced port a cath insertion with fluoroscopy   Social History:  reports that he quit smoking about 2 years ago. His smoking use included Cigarettes. He smoked 0.00 packs per day for 13 years. He has never used smokeless tobacco. He reports that he drinks about 6.0 ounces of alcohol per week. He reports that he uses illicit drugs (Marijuana).  Allergies  Allergen Reactions  . Morphine And Related Hives and Rash    Pt states he can take PO, but not IV and he is able to tolerate Dilaudid with no reactions.    Family History  Problem Relation Age of Onset  . Sickle cell  anemia Mother   . Sickle cell anemia Father   . Sickle cell trait Brother    Prior to Admission medications   Medication Sig Start Date End Date Taking? Authorizing Provider  celecoxib (CELEBREX) 200 MG capsule Take 1 capsule (200 mg total) by mouth 2 (two) times daily. 03/30/13  Yes Elwyn Reach, MD  ergocalciferol (VITAMIN D2) 50000 UNITS capsule Take 1 capsule (50,000 Units total) by mouth once a week. 04/22/13  Yes Dorena Dew, FNP  folic acid (FOLVITE) 1 MG tablet Take 1 tablet (1 mg total) by mouth every morning. 06/05/12  Yes [name], MD  HYDROmorphone (DILAUDID) 4 MG tablet Take 1 tablet (4 mg total) by mouth every 4 (four) hours as needed for severe pain. 04/24/13  Yes Dorena Dew, FNP  hydroxyurea (HYDREA) 500 MG capsule Take 1 capsule (500 mg total) by mouth 2 (two) times daily with a meal. May take with food to minimize GI side effects. 03/30/13  Yes Elwyn Reach, MD  morphine (MS CONTIN) 15 MG 12 hr tablet Take 1 tablet (15 mg total) by mouth 2 (two) times daily. 04/24/13  Yes Dorena Dew, FNP  potassium chloride SA (K-DUR,KLOR-CON) 20 MEQ tablet Take 1 tablet (20 mEq total) by mouth every morning. 03/30/13  Yes Elwyn Reach, MD  Rivaroxaban (XARELTO) 20 MG TABS tablet Take 20 mg by mouth daily with supper.   Yes Historical Provider, MD  zolpidem (AMBIEN) 10 MG tablet Take 1 tablet (10 mg total) by mouth at bedtime as needed for sleep. 04/24/13  Yes Dorena Dew, FNP   Physical Exam: Filed Vitals:   05/06/13 0901  BP: 134/86  Pulse: 86  Temp: 98.6 F (37 C)  TempSrc: Oral  Resp: 18  Weight: 172 lb (78.019 kg)  SpO2: 96%   General appearance: alert, cooperative, icteric and mild distress, jittery and shaking at times  Head: Normocephalic, without obvious abnormality, atraumatic  Eyes: negative findings: lids and lashes normal and pupils equal, round, reactive to light and accomodation, positive findings: sclera icteric bilateral  Nose:  Nares normal. Septum midline. Mucosa normal. No drainage or sinus tenderness., no discharge  Throat: abnormal findings: dry, pale mucus membranes  Neck: no adenopathy, no carotid bruit, no JVD and supple, symmetrical, trachea midline  Lungs: clear to auscultation bilaterally and normal percussion bilaterally  Chest wall: right sided costochondral tenderness posteriorly, left sided costochondral tenderness  Heart: S1, S2 normal  Abdomen: soft, non-tender; bowel sounds normal; no masses, no organomegaly  Extremities: extremities normal, atraumatic, no cyanosis or edema  Pulses: 2+ and symmetric  Skin: Skin color, texture, turgor normal. No rashes or lesions  Neurologic: Alert and oriented X 3, normal strength and tone. Normal symmetric reflexes. Normal coordination and gait  Labs on Admission:   Basic Metabolic Panel:  No results found for this basename: NA, K, CL, CO2, GLUCOSE, BUN, CREATININE, CALCIUM, MG, PHOS,  in the last 168 hours Liver Function Tests: No results found for this basename: AST, ALT, ALKPHOS, BILITOT, PROT, ALBUMIN,  in the last 168 hours CBC: No results found for this basename: WBC, NEUTROABS, HGB, HCT, MCV, PLT,  in the last 168 hours BNP: No components found with this basename: POCBNP,   Radiological Exams on Admission: No results found.  Assessment/Plan: Active Problems:   Chronic anticoagulation   Uses marijuana   Thrombocytosis   Rib pain   Dehydration   Vaso-occlusive sickle cell crisis  Admit to extended sickle cell day hospital for extended observation.  Initiate weight based rapid re-dosing protocol of IV dilaudid. Monitor closely. If pain intensity remains greater than 7/10, will transition to weight based dilaudid PCA.  Order K pad  Initiate IV analgesics. IV Toradol X 1 dose.  Starting hypotonic IVFs. Will check CBC w/ diff and complete metabolic panel.   Patient has taken all home medications.  Ordered medications for pruritis    Time spend:  38 mins Code Status: FULL Family Communication: pt fully counseled at bedside Disposition Plan: pending clinical course  Irwin Brakeman, MD  Pager [phone]  If 7PM-7AM, please contact night-coverage [email or web address] Password [facility] 05/06/2013, 9:19 AM

## 2013-05-06 NOTE — Discharge Instructions (Signed)
Sickle Cell Anemia, Adult °Sickle cell anemia is a condition in which red blood cells have an abnormal "sickle" shape. This abnormal shape shortens the cells' life span, which results in a lower than normal concentration of red blood cells in the blood. The sickle shape also causes the cells to clump together and block free blood flow through the blood vessels. As a result, the tissues and organs of the body do not receive enough oxygen. Sickle cell anemia causes organ damage and pain and increases the risk of infection. °CAUSES  °Sickle cell anemia is a genetic disorder. Those who receive two copies of the gene have the condition, and those who receive one copy have the trait. °RISK FACTORS °The sickle cell gene is most common in people whose families originated in [place]. Other areas of the globe where sickle cell trait occurs include the Mediterranean, [place] and [place], the [place], and the [place].  °SIGNS AND SYMPTOMS °· Pain, especially in the extremities, back, chest, or abdomen (common). The pain may start suddenly or may develop following an illness, especially if there is dehydration. Pain can also occur due to overexertion or exposure to extreme temperature changes. °· Frequent severe bacterial infections, especially certain types of pneumonia and meningitis. °· Pain and swelling in the hands and feet. °· Decreased activity.   °· Loss of appetite.   °· Change in behavior. °· Headaches. °· Seizures. °· Shortness of breath or difficulty breathing. °· Vision changes. °· Skin ulcers. °Those with the trait may not have symptoms or they may have mild symptoms.  °DIAGNOSIS  °Sickle cell anemia is diagnosed with blood tests that demonstrate the genetic trait. It is often diagnosed during the newborn period, due to mandatory testing nationwide. A variety of blood tests, X-rays, CT scans, MRI scans, ultrasounds, and lung function tests may also be done to monitor the condition. °TREATMENT  °Sickle  cell anemia may be treated with: °· Medicines. You may be given pain medicines, antibiotic medicines (to treat and prevent infections) or medicines to increase the production of certain types of hemoglobin. °· Fluids. °· Oxygen. °· Blood transfusions. °HOME CARE INSTRUCTIONS  °· Drink enough fluid to keep your urine clear or pale yellow. Increase your fluid intake in hot weather and during exercise. °· Do not smoke. Smoking lowers oxygen levels in the blood.   °· Only take over-the-counter or prescription medicines for pain, fever, or discomfort as directed by your health care provider. °· Take antibiotics as directed by your health care provider. Make sure you finish them it even if you start to feel better.   °· Take supplements as directed by your health care provider.   °· Consider wearing a medical alert bracelet. This tells anyone caring for you in an emergency of your condition.   °· When traveling, keep your medical information, health care provider's names, and the medicines you take with you at all times.   °· If you develop a fever, do not take medicines to reduce the fever right away. This could cover up a problem that is developing. Notify your health care provider. °· Keep all follow-up appointments with your health care provider. Sickle cell anemia requires regular medical care. °SEEK MEDICAL CARE IF: ° You have a fever. °SEEK IMMEDIATE MEDICAL CARE IF:  °· You feel dizzy or faint.   °· You have new abdominal pain, especially on the left side near the stomach area.   °· You develop a persistent, often uncomfortable and painful penile erection (priapism). If this is not treated immediately it   will lead to impotence.   °· You have numbness your arms or legs or you have a hard time moving them.   °· You have a hard time with speech.   °· You have a fever or persistent symptoms for more than 2 3 days.   °· You have a fever and your symptoms suddenly get worse.   °· You have signs or symptoms of infection.  These include:   °· Chills.   °· Abnormal tiredness (lethargy).   °· Irritability.   °· Poor eating.   °· Vomiting.   °· You develop pain that is not helped with medicine.   °· You develop shortness of breath. °· You have pain in your chest.   °· You are coughing up pus-like or bloody sputum.   °· You develop a stiff neck. °· Your feet or hands swell or have pain. °· Your abdomen appears bloated. °· You develop joint pain. °MAKE SURE YOU: °· Understand these instructions. °· Will watch your child's condition. °· Will get help right away if your child is not doing well or gets worse. °Document Released: 04/27/2005 Document Revised: 11/07/2012 Document Reviewed: 08/29/2012 °[employer]® Patient Information ©2014 [employer]. ° °

## 2013-05-06 NOTE — Discharge Summary (Signed)
Pt was seen and examined.  I agree with assessment and plan as detailed by Cammie Sickle, NP.   Encouraged close follow up.  Urine culture ordered (pt denies symptoms of dysuria).  Gerlene Fee, MD, CDE, [place], [place]

## 2013-05-06 NOTE — Discharge Summary (Signed)
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 He has been celebrating recently because he has a birthday coming up. He had a bout of acute chest syndrome a couple of months ago and multiple recent hospitalizations. He has been taking his regular home pain medications and it would get his pain down to a 6 or 7 but then the pain rapidly returned. His family has noted that his eyes have appeared more yellow than normal. He takes MS contin twice daily for long-acting pain control in addition to oral dilaudid which he takes for acute pain events. He denies chest pain and shortness of breath. He reports that he uses marijuana to help with pain management but does not smoke cigarettes. He does report that he lives with his brother who is a heavy smoker and patient receives a lot of second hand smoke exposure.     Hospital Course:  1. Sickle cell anemia with pain: Patient was admitted to the sickle cell day hospital for extended observation. Initiated IV Toradol. IV dilaudid was started per weight based rapid  re-dosing. Patient's pain intensity remained greater than 7/10, so patient was transitioned to dilaudid PCA per weight based protocol. Total PCA use during extended observation was 21.56 mg  with 28 demands, and 28 deliveries. Patient was given home medications and Dilaudid 4 mg po 30 minutes prior to discharge. Patient is functional and maintains that he can manage at home with prescribed medication regimen. Pain intensity prior to discharge is 4/10, which is decrease from 8/10 on admission.   2. Hypokalemia. Patient was admitted to the sickle cell day hospital for extended observation. Initiated hypotonic IVFs with potassium additive due to decreased potassium level. Rechecked serum potassium level, which increased to 3.6 from 3.0. Also,  patient will take Potassium chloride 20 mEq daily.   3. Dehydration: Mild dehydration resolved. Mucous membranes wnl.   Patient to follow up in clinic with nurse practitioner as scheduled.    Discharge Exam:  Filed Vitals:   05/06/13 1535  BP:   Pulse:   Temp:   Resp: 20   Filed Vitals:   05/06/13 1243 05/06/13 1343 05/06/13 1443 05/06/13 1535  BP: 122/66 127/69 142/71   Pulse: 74 75 83   Temp:   97.9 F (36.6 C)   TempSrc:   Oral   Resp: 25 20 19 20   Weight:      SpO2: 94% 93% 92% 93%   BP 142/71  Pulse 100  Temp(Src) 97.9 F (36.6 C) (Oral)  Resp 20  Wt 172 lb (78.019 kg)  SpO2 89% General  appearance: alert, cooperative, icteric and without distress Head: Normocephalic, without obvious abnormality, atraumatic  Lungs: clear to auscultation bilaterally and normal percussion bilaterally  Chest wall: right sided costochondral tenderness posteriorly, left sided costochondral tenderness  Heart: S1, S2 normal  Abdomen: soft, non-tender; bowel sounds normal; no masses, no organomegaly  Extremities: extremities normal, atraumatic, no cyanosis or edema  Pulses: 2+ and symmetric    Discharge Instructions   Future Appointments Provider  Department Dept Phone   05/15/2013 10:00 AM Dorena Dew, [place] [phone]       Medication List    ASK your doctor about these medications       celecoxib 200 MG capsule  Commonly known as:  CELEBREX  Take 1 capsule (200 mg total) by mouth 2 (two) times daily.     ergocalciferol 50000 UNITS capsule  Commonly known as:  VITAMIN D2  Take 1 capsule (50,000 Units total) by mouth once a week.     folic acid 1 MG tablet  Commonly known as:  FOLVITE  Take 1 tablet (1 mg total) by mouth every morning.     HYDROmorphone 4 MG tablet  Commonly known as:  DILAUDID  Take 1 tablet (4 mg total) by mouth every 4 (four) hours as needed for severe pain.     hydroxyurea 500 MG capsule  Commonly known as:  HYDREA  Take 1 capsule (500 mg total) by mouth 2 (two) times daily with a meal. May take with food to minimize GI side effects.     morphine 15 MG 12 hr tablet  Commonly known as:  MS CONTIN  Take 1 tablet (15 mg total) by mouth 2 (two) times daily.     potassium chloride SA 20 MEQ tablet  Commonly known as:  K-DUR,KLOR-CON  Take 1 tablet (20 mEq total) by mouth every morning.     XARELTO 20 MG Tabs tablet  Generic drug:  Rivaroxaban  Take 20 mg by mouth daily with supper.     zolpidem 10 MG tablet  Commonly known as:  AMBIEN  Take 1 tablet (10 mg total) by mouth at bedtime as needed for sleep.          The results of significant diagnostics from this hospitalization (including imaging, microbiology, ancillary and laboratory) are listed below for reference.    Significant Diagnostic Studies: Dg Chest 2 View  04/09/2013   CLINICAL DATA:  Bilateral lower rib pain  EXAM: CHEST  2 VIEW  COMPARISON:  DG CHEST 2 VIEW dated 04/07/2013; DG CHEST 2 VIEW dated 03/22/2013; DG CHEST 1V PORT dated 03/28/2013; CT ANGIO CHEST W/CM &/OR WO/CM dated 03/26/2013  FINDINGS: Grossly unchanged enlarged cardiac silhouette and mediastinal contours with nodular prominence of  the bilateral pulmonary hila compatible with the mediastinal and hilar adenopathy demonstrated on recently performed chest CT. The lungs remain hyperexpanded with flattening of the bilateral hemidiaphragms mild diffuse slightly nodular thickening of the pulmonary interstitium. Stable positioning of support apparatus. No new focal airspace opacities. No pleural effusion or pneumothorax. No definite evidence of edema. Grossly unchanged bones.  IMPRESSION: 1.  No acute cardiopulmonary disease. 2. Grossly unchanged findings of cardiomegaly and mediastinal/hilar adenopathy as demonstrated on recently performed chest CT.   Electronically Signed   By: Sandi Mariscal M.D.   On: 04/09/2013 08:45   Dg Chest 2 View  04/07/2013   CLINICAL DATA:  Sickle cell crisis and cough.  EXAM: CHEST  2 VIEW  COMPARISON:  DG CHEST  1V PORT dated 03/28/2013  FINDINGS: Prominent interstitial lung markings suggest chronic changes. No focal airspace disease or overt pulmonary edema. There is a left subclavian Port-A-Cath. Catheter tip in the lower SVC. Heart size is normal and negative for pleural effusions.  IMPRESSION: Chronic lung changes without acute findings.   Electronically Signed   By: Markus Daft M.D.   On: 04/07/2013 10:55    Microbiology: No results found for this or any previous visit (from the past 240 hour(s)).   Labs: Basic Metabolic Panel:  Recent Labs Lab 05/06/13 0930  NA 140  K 3.0*  CL 104  CO2 21  GLUCOSE 80  BUN 5*  CREATININE 0.53  CALCIUM 9.3  MG 1.6   Liver Function Tests:  Recent Labs Lab 05/06/13 0930  AST 41*  ALT 20  ALKPHOS 92  BILITOT 6.4*  PROT 7.4  ALBUMIN 4.1   No results found for this basename: LIPASE, AMYLASE,  in the last 168 hours No results found for this basename: AMMONIA,  in the last 168 hours CBC:  Recent Labs Lab 05/06/13 0930  WBC 18.9*  NEUTROABS 14.1*  HGB 7.7*  HCT 22.1*  MCV 92.5  PLT 470*   Cardiac Enzymes: No results found for this basename:  CKTOTAL, CKMB, CKMBINDEX, TROPONINI,  in the last 168 hours BNP: No components found with this basename: POCBNP,  CBG: No results found for this basename: GLUCAP,  in the last 168 hours Ferritin: No results found for this basename: FERRITIN,  in the last 168 hours  Time coordinating discharge: Greater than 30 minutes  Signed:  Mickey Esguerra M  05/06/2013, 5:20 PM

## 2013-05-06 NOTE — Progress Notes (Signed)
Per NP order patient's O2 checked while ambulating; results O2 sats 89-91%, HR 100-103, RR 20, patient denies dizziness, shortness of breath, or increased pain with ambulating. NP notified and acknowledged.

## 2013-05-06 NOTE — Telephone Encounter (Signed)
Returned patient call. Instructed patient to come to [facility]. Patient acknowledges.

## 2013-05-06 NOTE — Progress Notes (Signed)
Started individualized custom PCA dosing for this patient was ordered.  Murvin Natal, MD

## 2013-05-06 NOTE — Telephone Encounter (Signed)
Received patient call, patient c/o pain to ribs and back pain and headache, rating pain 8-9/10. Per patient pain started 2-3 days ago, patient has taken dilaudid and ms contin and pain would come down to 6-7/10 and return. Patient denies all other pertinent ROS per [facility] Triage Assessment. Informed patient that this RN will contact the Provider on call and patient is to receive a return call back with recommendation. Patient acknowledges.

## 2013-05-06 NOTE — Progress Notes (Signed)
Patient states ready for discharge. NP notified. Patient discharged home. VSS. Patient in no apparent distress. Discharge instruction reviewed. Patient acknowledges. Patient left day hospital with belongings ambulatory via self.

## 2013-05-10 ENCOUNTER — Emergency Department (HOSPITAL_COMMUNITY)
Admission: EM | Admit: 2013-05-10 | Discharge: 2013-05-10 | Disposition: A | Discharge status: Home / Self Care | Payer: Medicare Other | Attending: Emergency Medicine | Admitting: Emergency Medicine

## 2013-05-10 ENCOUNTER — Encounter (HOSPITAL_COMMUNITY): Payer: Self-pay | Admitting: Emergency Medicine

## 2013-05-10 DIAGNOSIS — D5700 Hb-SS disease with crisis, unspecified: Secondary | ICD-10-CM | POA: Diagnosis not present

## 2013-05-10 DIAGNOSIS — Z87891 Personal history of nicotine dependence: Secondary | ICD-10-CM | POA: Insufficient documentation

## 2013-05-10 DIAGNOSIS — I10 Essential (primary) hypertension: Secondary | ICD-10-CM | POA: Insufficient documentation

## 2013-05-10 DIAGNOSIS — Z7901 Long term (current) use of anticoagulants: Secondary | ICD-10-CM | POA: Insufficient documentation

## 2013-05-10 DIAGNOSIS — M545 Low back pain, unspecified: Secondary | ICD-10-CM | POA: Insufficient documentation

## 2013-05-10 DIAGNOSIS — Z86711 Personal history of pulmonary embolism: Secondary | ICD-10-CM | POA: Insufficient documentation

## 2013-05-10 DIAGNOSIS — Q8909 Congenital malformations of spleen: Secondary | ICD-10-CM | POA: Insufficient documentation

## 2013-05-10 DIAGNOSIS — Z79899 Other long term (current) drug therapy: Secondary | ICD-10-CM | POA: Insufficient documentation

## 2013-05-10 DIAGNOSIS — Z86718 Personal history of other venous thrombosis and embolism: Secondary | ICD-10-CM | POA: Insufficient documentation

## 2013-05-10 DIAGNOSIS — M79609 Pain in unspecified limb: Secondary | ICD-10-CM | POA: Insufficient documentation

## 2013-05-10 DIAGNOSIS — Z96649 Presence of unspecified artificial hip joint: Secondary | ICD-10-CM | POA: Insufficient documentation

## 2013-05-10 LAB — CBC WITH DIFFERENTIAL/PLATELET
Basophils Absolute: 0.2 10*3/uL — ABNORMAL HIGH (ref 0.0–0.1)
Basophils Relative: 1 % (ref 0–1)
EOS PCT: 3 % (ref 0–5)
Eosinophils Absolute: 0.6 10*3/uL (ref 0.0–0.7)
HCT: 23.3 % — ABNORMAL LOW (ref 39.0–52.0)
HEMOGLOBIN: 7.9 g/dL — AB (ref 13.0–17.0)
LYMPHS ABS: 2.8 10*3/uL (ref 0.7–4.0)
Lymphocytes Relative: 15 % (ref 12–46)
MCH: 31.9 pg (ref 26.0–34.0)
MCHC: 33.9 g/dL (ref 30.0–36.0)
MCV: 94 fL (ref 78.0–100.0)
MONO ABS: 1.9 10*3/uL — AB (ref 0.1–1.0)
Monocytes Relative: 10 % (ref 3–12)
Neutro Abs: 13 10*3/uL — ABNORMAL HIGH (ref 1.7–7.7)
Neutrophils Relative %: 71 % (ref 43–77)
Platelets: 452 10*3/uL — ABNORMAL HIGH (ref 150–400)
RBC: 2.48 MIL/uL — AB (ref 4.22–5.81)
RDW: 21.5 % — ABNORMAL HIGH (ref 11.5–15.5)
WBC: 18.5 10*3/uL — ABNORMAL HIGH (ref 4.0–10.5)

## 2013-05-10 LAB — COMPREHENSIVE METABOLIC PANEL
ALT: 17 U/L (ref 0–53)
AST: 30 U/L (ref 0–37)
Albumin: 4 g/dL (ref 3.5–5.2)
Alkaline Phosphatase: 90 U/L (ref 39–117)
BUN: 6 mg/dL (ref 6–23)
CO2: 21 meq/L (ref 19–32)
Calcium: 8.8 mg/dL (ref 8.4–10.5)
Chloride: 106 mEq/L (ref 96–112)
Creatinine, Ser: 0.63 mg/dL (ref 0.50–1.35)
GLUCOSE: 122 mg/dL — AB (ref 70–99)
POTASSIUM: 3.4 meq/L — AB (ref 3.7–5.3)
Sodium: 141 mEq/L (ref 137–147)
TOTAL PROTEIN: 7.5 g/dL (ref 6.0–8.3)
Total Bilirubin: 5.2 mg/dL — ABNORMAL HIGH (ref 0.3–1.2)

## 2013-05-10 LAB — RETICULOCYTES
RBC.: 2.48 MIL/uL — ABNORMAL LOW (ref 4.22–5.81)
Retic Count, Absolute: 404.2 10*3/uL — ABNORMAL HIGH (ref 19.0–186.0)
Retic Ct Pct: 16.3 % — ABNORMAL HIGH (ref 0.4–3.1)

## 2013-05-10 MED ORDER — HYDROMORPHONE HCL PF 1 MG/ML IJ SOLN
1.0000 mg | INTRAMUSCULAR | Status: DC | PRN
  Administered 2013-05-10: 1 mg via INTRAVENOUS
  Filled 2013-05-10: qty 1

## 2013-05-10 MED ORDER — HYDROMORPHONE HCL PF 2 MG/ML IJ SOLN
2.0000 mg | INTRAMUSCULAR | Status: AC | PRN
  Administered 2013-05-10 (×3): 2 mg via INTRAVENOUS
  Filled 2013-05-10 (×3): qty 1

## 2013-05-10 MED ORDER — KETOROLAC TROMETHAMINE 30 MG/ML IJ SOLN
30.0000 mg | Freq: Once | INTRAMUSCULAR | Status: AC
  Administered 2013-05-10: 30 mg via INTRAVENOUS
  Filled 2013-05-10: qty 1

## 2013-05-10 MED ORDER — HEPARIN SOD (PORK) LOCK FLUSH 100 UNIT/ML IV SOLN
500.0000 [IU] | Freq: Once | INTRAVENOUS | Status: AC
  Administered 2013-05-10: 500 [IU]
  Filled 2013-05-10: qty 5

## 2013-05-10 MED ORDER — SODIUM CHLORIDE 0.9 % IV BOLUS (SEPSIS)
1000.0000 mL | Freq: Once | INTRAVENOUS | Status: AC
  Administered 2013-05-10: 1000 mL via INTRAVENOUS

## 2013-05-10 MED ORDER — SODIUM CHLORIDE 0.9 % IJ SOLN
INTRAMUSCULAR | Status: AC
  Administered 2013-05-10: 10 mL
  Filled 2013-05-10: qty 10

## 2013-05-10 MED ORDER — ONDANSETRON HCL 4 MG/2ML IJ SOLN
4.0000 mg | Freq: Once | INTRAMUSCULAR | Status: AC
  Administered 2013-05-10: 4 mg via INTRAVENOUS
  Filled 2013-05-10: qty 2

## 2013-05-10 MED ORDER — DIPHENHYDRAMINE HCL 50 MG/ML IJ SOLN
25.0000 mg | Freq: Once | INTRAMUSCULAR | Status: AC
  Administered 2013-05-10: 25 mg via INTRAVENOUS
  Filled 2013-05-10: qty 1

## 2013-05-10 NOTE — Discharge Instructions (Signed)
Please followup with your primary care provider for continued evaluation and treatment.    Sickle Cell Anemia, Adult Sickle cell anemia is a condition where your red blood cells are shaped like sickles. Red blood cells carry oxygen through the body. Sickle-shaped red blood cells cells do not live as long as normal red blood cells. They also clump together and block blood from flowing through the blood vessels. These things prevent the body from getting enough oxygen. Sickle cell anemia causes organ damage and pain. It also increases the risk of infection. HOME CARE  Drink enough fluid to keep your pee (urine) clear or pale yellow. Drink more in hot weather and during exercise.  Do not smoke. Smoking lowers oxygen levels in the blood.  Only take over-the-counter or prescription medicines as told by your doctor.  Take antibiotic medicines as told by your doctor. Make sure you finish them it even if you start to feel better.  Take supplements as told by your doctor.  Consider wearing a medical alert bracelet. This tells anyone caring for you in an emergency of your condition.  When traveling, keep your medical information, doctors' names, and the medicines you take with you at all times.  If you have a fever, do not take fever medicines right away. This could cover up a problem. Tell your doctor.   Keep all follow-up appointments with your doctor. Sickle cell anemia requires regular medical care. GET HELP IF: You have a fever. GET HELP RIGHT AWAY IF:  You feel dizzy or faint.  You have new belly (abdominal) pain, especially on the left side near the stomach area.  You have a lasting, often uncomfortable and painful erection of the penis (priapism). If it is not treated right away, you will become unable to have sex (impotence).  You have numbness your arms or legs or you have a hard time moving them.  You have a hard time talking.  You have a fever or lasting symptoms for more  than 2 3 days.  You have a fever and your symptoms suddenly get worse.  You have signs or symptoms of infection. These include:  Chills.  Being more tired than normal (lethargy).  Irritability.  Poor eating.  Throwing up (vomiting).  You have pain that is not helped with medicine.  You have shortness of breath.  You have pain in your chest.  You are coughing up pus-like or bloody mucus.  You have a stiff neck.  Your feet or hands swell or have pain.  Your belly looks bloated.  Your joints hurt. MAKE SURE YOU:  Understand these instructions.  Will watch your condition.  Will get help right away if you are not doing well or get worse. Document Released: 11/07/2012 Document Reviewed: 08/29/2012 [facility] Patient Information 2014 [place].

## 2013-05-10 NOTE — ED Provider Notes (Signed)
CSN: [id number]     Arrival date & time 05/10/13  7616 History   First MD Initiated Contact with Patient 05/10/13 [phone]     Chief Complaint  Patient presents with  . Sickle Cell Pain Crisis   HPI  History provided by the patient. Patient is a 33 year old male with history of sickle cell anemia presenting with complaints of sickle cell pains to his diffuse lower back and bilateral thighs and quadriceps areas. Symptoms have been going on for the past week. Patient has been using his home medications including his pain medicine of MS Contin and Dilaudid without any improvement of symptoms. He denies any associated chest pain or shortness of breath. No recent illnesses, fever, chills or sweats. No skin change or redness of the skin. No swelling. No other aggravating or alleviating factors. No other associated symptoms.    Past Medical History  Diagnosis Date  . Sickle cell anemia   . Blood transfusion   . 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 Hypokalemia   . Mood disorder   . History of pulmonary embolus (PE)   . Avascular necrosis   . Leukocytosis     Chronic  . Thrombocytosis     Chronic  . Hypertension   . History of Clostridium difficile infection   . Uses marijuana   . Chronic anticoagulation   . Functional asplenia   . Former smoker   . Second hand tobacco smoke exposure   . Alcohol consumption of one to four drinks per day    Past Surgical History  Procedure Laterality Date  . Right hip replacement      08/2006  . Cholecystectomy      01/2008  . Porta cath placement    . Porta cath removal    . Umbilical hernia repair      01/2008  . Excision of left periauricular cyst      10/2009  . Excision of right ear lobe cyst with primary closur      11/2007  . Portacath placement  01/05/2012    Procedure: INSERTION PORT-A-CATH;  Surgeon: Odis Hollingshead, MD;  Location: [place];  Service: General;  Laterality: N/A;  ultrasound guiced port a cath  insertion with fluoroscopy   Family History  Problem Relation Age of Onset  . Sickle cell anemia Mother   . Sickle cell anemia Father   . Sickle cell trait Brother    History  Substance Use Topics  . Smoking status: Former Smoker -- 13 years    Types: Cigarettes    Quit date: 07/08/2010  . Smokeless tobacco: Never Used  . Alcohol Use: 6.0 oz/week    10 Shots of liquor per week    Review of Systems  Constitutional: Negative for fever, chills and diaphoresis.  Respiratory: Negative for shortness of breath.   Cardiovascular: Negative for chest pain.  Gastrointestinal: Negative for nausea, vomiting and diarrhea.  Musculoskeletal: Positive for back pain and myalgias.  All other systems reviewed and are negative.     Allergies  Morphine and related  Home Medications   Current Outpatient Rx  Name  Route  Sig  Dispense  Refill  . celecoxib (CELEBREX) 200 MG capsule   Oral   Take 1 capsule (200 mg total) by mouth 2 (two) times daily.   60 capsule   1   . ergocalciferol (VITAMIN D2) 50000 UNITS capsule   Oral   Take 1 capsule (50,000 Units total) by mouth  once a week.   4 capsule   12   . folic acid (FOLVITE) 1 MG tablet   Oral   Take 1 tablet (1 mg total) by mouth every morning.   30 tablet   11   . HYDROmorphone (DILAUDID) 4 MG tablet   Oral   Take 1 tablet (4 mg total) by mouth every 4 (four) hours as needed for severe pain.   90 tablet   0   . hydroxyurea (HYDREA) 500 MG capsule   Oral   Take 1 capsule (500 mg total) by mouth 2 (two) times daily with a meal. May take with food to minimize GI side effects.   60 capsule   0   . morphine (MS CONTIN) 15 MG 12 hr tablet   Oral   Take 1 tablet (15 mg total) by mouth 2 (two) times daily.   60 tablet   0     Rx not to be filled before 04/27/2013   . potassium chloride SA (K-DUR,KLOR-CON) 20 MEQ tablet   Oral   Take 1 tablet (20 mEq total) by mouth every morning.   30 tablet   3   . Rivaroxaban  (XARELTO) 20 MG TABS tablet   Oral   Take 20 mg by mouth daily with supper.         . zolpidem (AMBIEN) 10 MG tablet   Oral   Take 1 tablet (10 mg total) by mouth at bedtime as needed for sleep.   30 tablet   0    BP 129/80  Pulse 86  Temp(Src) 98.1 F (36.7 C) (Oral)  Resp 18  SpO2 95% Physical Exam  Nursing note and vitals reviewed. Constitutional: He is oriented to person, place, and time. He appears well-developed and well-nourished.  HENT:  Head: Normocephalic and atraumatic.  Neck: Normal range of motion. Neck supple.  Cardiovascular: Normal rate and regular rhythm.   No murmur heard. Pulmonary/Chest: Effort normal and breath sounds normal. No respiratory distress. He has no wheezes. He has no rales.  Abdominal: Soft. There is no tenderness. There is no rebound and no guarding.  Musculoskeletal: Normal range of motion. He exhibits no edema and no tenderness.       Cervical back: Normal.       Thoracic back: Normal.       Lumbar back: Normal. He exhibits no bony tenderness.  Neurological: He is alert and oriented to person, place, and time.  Skin: Skin is warm. No rash noted.  Psychiatric: He has a normal mood and affect. His behavior is normal.    ED Course  Procedures   COORDINATION OF CARE:  Nursing notes reviewed. Vital signs reviewed. Initial pt interview and examination performed.   Filed Vitals:   05/10/13 0318  BP: 129/80  Pulse: 86  Temp: 98.1 F (36.7 C)  TempSrc: Oral  Resp: 18  SpO2: 95%    3:56 AM-patient seen and evaluated. Patient appears in some discomfort no acute distress. No signs of severe pain. Patient with unremarkable vital signs. No concerning symptoms. Discussed workup and treatment plan with patient and he agrees.   6:00 AM patient reporting having very good improvement of pain. He feels that he may be ready to go home after another dose of pain medicine. Lab testing at baseline without any concerning findings.   Treatment  plan initiated: Medications  sodium chloride 0.9 % bolus 1,000 mL (not administered)  sodium chloride 0.9 % injection (not administered)  HYDROmorphone (DILAUDID)  injection 2 mg (not administered)  ketorolac (TORADOL) 30 MG/ML injection 30 mg (not administered)  diphenhydrAMINE (BENADRYL) injection 25 mg (not administered)  ondansetron (ZOFRAN) injection 4 mg (not administered)    Results for orders placed during the hospital encounter of 05/10/13  CBC WITH DIFFERENTIAL      Result Value Ref Range   WBC 18.5 (*) 4.0 - 10.5 K/uL   RBC 2.48 (*) 4.22 - 5.81 MIL/uL   Hemoglobin 7.9 (*) 13.0 - 17.0 g/dL   HCT 23.3 (*) 39.0 - 52.0 %   MCV 94.0  78.0 - 100.0 fL   MCH 31.9  26.0 - 34.0 pg   MCHC 33.9  30.0 - 36.0 g/dL   RDW 21.5 (*) 11.5 - 15.5 %   Platelets 452 (*) 150 - 400 K/uL   Neutrophils Relative % 71  43 - 77 %   Lymphocytes Relative 15  12 - 46 %   Monocytes Relative 10  3 - 12 %   Eosinophils Relative 3  0 - 5 %   Basophils Relative 1  0 - 1 %   Neutro Abs 13.0 (*) 1.7 - 7.7 K/uL   Lymphs Abs 2.8  0.7 - 4.0 K/uL   Monocytes Absolute 1.9 (*) 0.1 - 1.0 K/uL   Eosinophils Absolute 0.6  0.0 - 0.7 K/uL   Basophils Absolute 0.2 (*) 0.0 - 0.1 K/uL   RBC Morphology POLYCHROMASIA PRESENT    RETICULOCYTES      Result Value Ref Range   Retic Ct Pct 16.3 (*) 0.4 - 3.1 %   RBC. 2.48 (*) 4.22 - 5.81 MIL/uL   Retic Count, Manual 404.2 (*) 19.0 - 186.0 K/uL  COMPREHENSIVE METABOLIC PANEL      Result Value Ref Range   Sodium 141  137 - 147 mEq/L   Potassium 3.4 (*) 3.7 - 5.3 mEq/L   Chloride 106  96 - 112 mEq/L   CO2 21  19 - 32 mEq/L   Glucose, Bld 122 (*) 70 - 99 mg/dL   BUN 6  6 - 23 mg/dL   Creatinine, Ser 0.63  0.50 - 1.35 mg/dL   Calcium 8.8  8.4 - 10.5 mg/dL   Total Protein 7.5  6.0 - 8.3 g/dL   Albumin 4.0  3.5 - 5.2 g/dL   AST 30  0 - 37 U/L   ALT 17  0 - 53 U/L   Alkaline Phosphatase 90  39 - 117 U/L   Total Bilirubin 5.2 (*) 0.3 - 1.2 mg/dL   GFR calc non Af Amer >90   >90 mL/min   GFR calc Af Amer >90  >90 mL/min     MDM   Final diagnoses:  Sickle cell pain crisis         Martie Lee, PA-C 05/10/13 [phone]

## 2013-05-10 NOTE — ED Notes (Signed)
Pt is c/o back and leg pain  Pt states pain has been going on all week

## 2013-05-10 NOTE — ED Provider Notes (Signed)
Medical screening examination/treatment/procedure(s) were performed by non-physician practitioner and as supervising physician I was immediately available for consultation/collaboration.   EKG Interpretation None       Varian Innes, MD 05/10/13 0819 

## 2013-05-13 ENCOUNTER — Inpatient Hospital Stay (HOSPITAL_COMMUNITY)
Admission: AD | Admit: 2013-05-13 | Discharge: 2013-05-20 | DRG: 811 | Disposition: A | Discharge status: Home / Self Care | Payer: Medicare Other | Attending: Family Medicine | Admitting: Family Medicine

## 2013-05-13 ENCOUNTER — Telehealth (HOSPITAL_COMMUNITY): Payer: Self-pay | Admitting: Hematology

## 2013-05-13 ENCOUNTER — Encounter (HOSPITAL_COMMUNITY): Payer: Self-pay | Admitting: Hematology

## 2013-05-13 ENCOUNTER — Non-Acute Institutional Stay (HOSPITAL_COMMUNITY): Payer: Medicare Other

## 2013-05-13 DIAGNOSIS — D72829 Elevated white blood cell count, unspecified: Secondary | ICD-10-CM

## 2013-05-13 DIAGNOSIS — Q8901 Asplenia (congenital): Secondary | ICD-10-CM

## 2013-05-13 DIAGNOSIS — F39 Unspecified mood [affective] disorder: Secondary | ICD-10-CM | POA: Diagnosis not present

## 2013-05-13 DIAGNOSIS — D571 Sickle-cell disease without crisis: Secondary | ICD-10-CM

## 2013-05-13 DIAGNOSIS — Z91199 Patient's noncompliance with other medical treatment and regimen due to unspecified reason: Secondary | ICD-10-CM

## 2013-05-13 DIAGNOSIS — Z7722 Contact with and (suspected) exposure to environmental tobacco smoke (acute) (chronic): Secondary | ICD-10-CM

## 2013-05-13 DIAGNOSIS — D75839 Thrombocytosis, unspecified: Secondary | ICD-10-CM

## 2013-05-13 DIAGNOSIS — Z7289 Other problems related to lifestyle: Secondary | ICD-10-CM

## 2013-05-13 DIAGNOSIS — G8929 Other chronic pain: Secondary | ICD-10-CM | POA: Diagnosis present

## 2013-05-13 DIAGNOSIS — E876 Hypokalemia: Secondary | ICD-10-CM

## 2013-05-13 DIAGNOSIS — F1120 Opioid dependence, uncomplicated: Secondary | ICD-10-CM | POA: Diagnosis present

## 2013-05-13 DIAGNOSIS — F1290 Cannabis use, unspecified, uncomplicated: Secondary | ICD-10-CM | POA: Diagnosis present

## 2013-05-13 DIAGNOSIS — R079 Chest pain, unspecified: Secondary | ICD-10-CM | POA: Diagnosis present

## 2013-05-13 DIAGNOSIS — R5381 Other malaise: Secondary | ICD-10-CM | POA: Diagnosis not present

## 2013-05-13 DIAGNOSIS — M8700 Idiopathic aseptic necrosis of unspecified bone: Secondary | ICD-10-CM

## 2013-05-13 DIAGNOSIS — F1210 Cannabis abuse, uncomplicated: Secondary | ICD-10-CM | POA: Diagnosis present

## 2013-05-13 DIAGNOSIS — D473 Essential (hemorrhagic) thrombocythemia: Secondary | ICD-10-CM

## 2013-05-13 DIAGNOSIS — R5383 Other fatigue: Secondary | ICD-10-CM

## 2013-05-13 DIAGNOSIS — Z22322 Carrier or suspected carrier of Methicillin resistant Staphylococcus aureus: Secondary | ICD-10-CM

## 2013-05-13 DIAGNOSIS — M79605 Pain in left leg: Secondary | ICD-10-CM

## 2013-05-13 DIAGNOSIS — Z9089 Acquired absence of other organs: Secondary | ICD-10-CM

## 2013-05-13 DIAGNOSIS — Z96649 Presence of unspecified artificial hip joint: Secondary | ICD-10-CM

## 2013-05-13 DIAGNOSIS — R0789 Other chest pain: Secondary | ICD-10-CM

## 2013-05-13 DIAGNOSIS — E559 Vitamin D deficiency, unspecified: Secondary | ICD-10-CM

## 2013-05-13 DIAGNOSIS — J189 Pneumonia, unspecified organism: Secondary | ICD-10-CM | POA: Diagnosis present

## 2013-05-13 DIAGNOSIS — I509 Heart failure, unspecified: Secondary | ICD-10-CM | POA: Diagnosis present

## 2013-05-13 DIAGNOSIS — I2699 Other pulmonary embolism without acute cor pulmonale: Secondary | ICD-10-CM

## 2013-05-13 DIAGNOSIS — D730 Hyposplenism: Secondary | ICD-10-CM

## 2013-05-13 DIAGNOSIS — M79604 Pain in right leg: Secondary | ICD-10-CM

## 2013-05-13 DIAGNOSIS — Z86711 Personal history of pulmonary embolism: Secondary | ICD-10-CM

## 2013-05-13 DIAGNOSIS — Z87891 Personal history of nicotine dependence: Secondary | ICD-10-CM

## 2013-05-13 DIAGNOSIS — D5701 Hb-SS disease with acute chest syndrome: Secondary | ICD-10-CM

## 2013-05-13 DIAGNOSIS — Z832 Family history of diseases of the blood and blood-forming organs and certain disorders involving the immune mechanism: Secondary | ICD-10-CM

## 2013-05-13 DIAGNOSIS — I2782 Chronic pulmonary embolism: Secondary | ICD-10-CM | POA: Diagnosis present

## 2013-05-13 DIAGNOSIS — I5030 Unspecified diastolic (congestive) heart failure: Secondary | ICD-10-CM

## 2013-05-13 DIAGNOSIS — E860 Dehydration: Secondary | ICD-10-CM | POA: Diagnosis not present

## 2013-05-13 DIAGNOSIS — R0781 Pleurodynia: Secondary | ICD-10-CM

## 2013-05-13 DIAGNOSIS — Z9119 Patient's noncompliance with other medical treatment and regimen: Secondary | ICD-10-CM

## 2013-05-13 DIAGNOSIS — R0902 Hypoxemia: Secondary | ICD-10-CM

## 2013-05-13 DIAGNOSIS — R110 Nausea: Secondary | ICD-10-CM | POA: Diagnosis present

## 2013-05-13 DIAGNOSIS — M545 Low back pain, unspecified: Secondary | ICD-10-CM

## 2013-05-13 DIAGNOSIS — R17 Unspecified jaundice: Secondary | ICD-10-CM | POA: Diagnosis present

## 2013-05-13 DIAGNOSIS — R0609 Other forms of dyspnea: Secondary | ICD-10-CM

## 2013-05-13 DIAGNOSIS — M79609 Pain in unspecified limb: Secondary | ICD-10-CM | POA: Diagnosis present

## 2013-05-13 DIAGNOSIS — I10 Essential (primary) hypertension: Secondary | ICD-10-CM

## 2013-05-13 DIAGNOSIS — Z7901 Long term (current) use of anticoagulants: Secondary | ICD-10-CM

## 2013-05-13 DIAGNOSIS — D5700 Hb-SS disease with crisis, unspecified: Secondary | ICD-10-CM | POA: Diagnosis not present

## 2013-05-13 DIAGNOSIS — L299 Pruritus, unspecified: Secondary | ICD-10-CM | POA: Diagnosis present

## 2013-05-13 DIAGNOSIS — Z789 Other specified health status: Secondary | ICD-10-CM

## 2013-05-13 HISTORY — DX: Hb-SS disease with acute chest syndrome: D57.01

## 2013-05-13 LAB — COMPREHENSIVE METABOLIC PANEL
ALT: 19 U/L (ref 0–53)
AST: 37 U/L (ref 0–37)
Albumin: 4.1 g/dL (ref 3.5–5.2)
Alkaline Phosphatase: 95 U/L (ref 39–117)
BUN: 5 mg/dL — ABNORMAL LOW (ref 6–23)
CALCIUM: 9.1 mg/dL (ref 8.4–10.5)
CO2: 22 mEq/L (ref 19–32)
Chloride: 104 mEq/L (ref 96–112)
Creatinine, Ser: 0.56 mg/dL (ref 0.50–1.35)
GFR calc Af Amer: >90 mL/min (ref >90)
Glucose, Bld: 100 mg/dL — ABNORMAL HIGH (ref 70–99)
POTASSIUM: 3.3 meq/L — AB (ref 3.7–5.3)
Sodium: 139 mEq/L (ref 137–147)
TOTAL PROTEIN: 7.6 g/dL (ref 6.0–8.3)
Total Bilirubin: 5.9 mg/dL — ABNORMAL HIGH (ref 0.3–1.2)

## 2013-05-13 LAB — RETICULOCYTES
RBC.: 2.63 MIL/uL — ABNORMAL LOW (ref 4.22–5.81)
Retic Ct Pct: >23 % — ABNORMAL HIGH (ref 0.4–3.1)

## 2013-05-13 LAB — CBC WITH DIFFERENTIAL/PLATELET
Basophils Absolute: 0.2 10*3/uL — ABNORMAL HIGH (ref 0.0–0.1)
Basophils Relative: 1 % (ref 0–1)
Eosinophils Absolute: 0.4 10*3/uL (ref 0.0–0.7)
Eosinophils Relative: 2 % (ref 0–5)
HEMATOCRIT: 24.7 % — AB (ref 39.0–52.0)
Hemoglobin: 8.3 g/dL — ABNORMAL LOW (ref 13.0–17.0)
Lymphocytes Relative: 11 % — ABNORMAL LOW (ref 12–46)
Lymphs Abs: 2.2 10*3/uL (ref 0.7–4.0)
MCH: 31.6 pg (ref 26.0–34.0)
MCHC: 33.6 g/dL (ref 30.0–36.0)
MCV: 93.9 fL (ref 78.0–100.0)
MONOS PCT: 12 % (ref 3–12)
Monocytes Absolute: 2.4 10*3/uL — ABNORMAL HIGH (ref 0.1–1.0)
NRBC: 4 /100{WBCs} — AB
Neutro Abs: 14.7 10*3/uL — ABNORMAL HIGH (ref 1.7–7.7)
Neutrophils Relative %: 74 % (ref 43–77)
Platelets: 473 10*3/uL — ABNORMAL HIGH (ref 150–400)
RBC: 2.63 MIL/uL — ABNORMAL LOW (ref 4.22–5.81)
RDW: 22.9 % — ABNORMAL HIGH (ref 11.5–15.5)
WBC: 19.9 10*3/uL — ABNORMAL HIGH (ref 4.0–10.5)

## 2013-05-13 LAB — CBC
HCT: 22.8 % — ABNORMAL LOW (ref 39.0–52.0)
Hemoglobin: 8 g/dL — ABNORMAL LOW (ref 13.0–17.0)
MCH: 32.1 pg (ref 26.0–34.0)
MCHC: 35.1 g/dL (ref 30.0–36.0)
MCV: 91.6 fL (ref 78.0–100.0)
PLATELETS: 477 10*3/uL — AB (ref 150–400)
RBC: 2.49 MIL/uL — ABNORMAL LOW (ref 4.22–5.81)
RDW: 22.4 % — ABNORMAL HIGH (ref 11.5–15.5)
WBC: 21.6 10*3/uL — AB (ref 4.0–10.5)

## 2013-05-13 LAB — CREATININE, SERUM: Creatinine, Ser: 0.57 mg/dL (ref 0.50–1.35)

## 2013-05-13 LAB — MRSA PCR SCREENING: MRSA by PCR: POSITIVE — AB

## 2013-05-13 MED ORDER — DIPHENHYDRAMINE HCL 25 MG PO CAPS
25.0000 mg | ORAL_CAPSULE | ORAL | Status: DC | PRN
  Administered 2013-05-13: 25 mg via ORAL
  Administered 2013-05-13 – 2013-05-14 (×2): 50 mg via ORAL
  Administered 2013-05-15: 25 mg via ORAL
  Filled 2013-05-13: qty 2
  Filled 2013-05-13 (×2): qty 1
  Filled 2013-05-13: qty 2

## 2013-05-13 MED ORDER — SODIUM CHLORIDE 0.9 % IJ SOLN: 9.0000 mL | INTRAMUSCULAR | Status: DC | PRN

## 2013-05-13 MED ORDER — VANCOMYCIN HCL 10 G IV SOLR
1500.0000 mg | Freq: Once | INTRAVENOUS | Status: AC
  Administered 2013-05-13: 1500 mg via INTRAVENOUS
  Filled 2013-05-13: qty 1500

## 2013-05-13 MED ORDER — NALOXONE HCL 0.4 MG/ML IJ SOLN: 0.4000 mg | INTRAMUSCULAR | Status: DC | PRN

## 2013-05-13 MED ORDER — MUPIROCIN 2 % EX OINT
1.0000 "application " | TOPICAL_OINTMENT | Freq: Two times a day (BID) | CUTANEOUS | Status: AC
  Administered 2013-05-14 – 2013-05-18 (×10): 1 via NASAL
  Filled 2013-05-13 (×3): qty 22

## 2013-05-13 MED ORDER — SENNA 8.6 MG PO TABS
1.0000 | ORAL_TABLET | Freq: Two times a day (BID) | ORAL | Status: DC
  Administered 2013-05-18 – 2013-05-19 (×4): 8.6 mg via ORAL
  Filled 2013-05-13 (×4): qty 1

## 2013-05-13 MED ORDER — FOLIC ACID 1 MG PO TABS: 1.0000 mg | ORAL_TABLET | Freq: Every morning | ORAL | Status: DC

## 2013-05-13 MED ORDER — HYDROMORPHONE 0.3 MG/ML IV SOLN
INTRAVENOUS | Status: DC
  Administered 2013-05-13 (×2): via INTRAVENOUS
  Administered 2013-05-13: 2 mg via INTRAVENOUS
  Administered 2013-05-13: 27.1 mg via INTRAVENOUS
  Administered 2013-05-13 (×3): via INTRAVENOUS
  Administered 2013-05-13: 4.3 mg via INTRAVENOUS
  Administered 2013-05-14: 2.68 mg via INTRAVENOUS
  Administered 2013-05-14: 13:00:00 via INTRAVENOUS
  Administered 2013-05-14: 9.09 mg via INTRAVENOUS
  Administered 2013-05-14: 7.6 mg via INTRAVENOUS
  Administered 2013-05-14: 4.8 mg via INTRAVENOUS
  Administered 2013-05-14 (×2): via INTRAVENOUS
  Administered 2013-05-14: 5.59 mg via INTRAVENOUS
  Administered 2013-05-15 (×2): via INTRAVENOUS
  Administered 2013-05-15: 7.7 mg via INTRAVENOUS
  Administered 2013-05-15: 09:00:00 via INTRAVENOUS
  Administered 2013-05-15: 5.9 mg via INTRAVENOUS
  Administered 2013-05-15: 3.19 mg via INTRAVENOUS
  Administered 2013-05-15 – 2013-05-16 (×3): via INTRAVENOUS
  Administered 2013-05-16: 0.8 mg via INTRAVENOUS
  Administered 2013-05-16: 04:00:00 via INTRAVENOUS
  Administered 2013-05-16: 14.32 mg via INTRAVENOUS
  Filled 2013-05-13 (×19): qty 25

## 2013-05-13 MED ORDER — ONDANSETRON HCL 4 MG/2ML IJ SOLN
4.0000 mg | INTRAMUSCULAR | Status: DC | PRN
  Administered 2013-05-16 – 2013-05-17 (×3): 4 mg via INTRAVENOUS
  Filled 2013-05-13 (×3): qty 2

## 2013-05-13 MED ORDER — VANCOMYCIN HCL IN DEXTROSE 1-5 GM/200ML-% IV SOLN
1000.0000 mg | Freq: Three times a day (TID) | INTRAVENOUS | Status: DC
  Administered 2013-05-14 (×3): 1000 mg via INTRAVENOUS
  Filled 2013-05-13 (×4): qty 200

## 2013-05-13 MED ORDER — CEFEPIME HCL 1 G IJ SOLR
1.0000 g | Freq: Three times a day (TID) | INTRAMUSCULAR | Status: DC
  Administered 2013-05-13 – 2013-05-15 (×6): 1 g via INTRAVENOUS
  Filled 2013-05-13 (×7): qty 1

## 2013-05-13 MED ORDER — ONDANSETRON HCL 4 MG PO TABS: 4.0000 mg | ORAL_TABLET | ORAL | Status: DC | PRN

## 2013-05-13 MED ORDER — ZOLPIDEM TARTRATE 10 MG PO TABS
10.0000 mg | ORAL_TABLET | Freq: Every evening | ORAL | Status: DC | PRN
  Administered 2013-05-13 – 2013-05-18 (×5): 10 mg via ORAL
  Filled 2013-05-13 (×5): qty 1

## 2013-05-13 MED ORDER — KETOROLAC TROMETHAMINE 15 MG/ML IJ SOLN
15.0000 mg | Freq: Four times a day (QID) | INTRAMUSCULAR | Status: AC
  Administered 2013-05-13 – 2013-05-18 (×19): 15 mg via INTRAVENOUS
  Filled 2013-05-13 (×28): qty 1

## 2013-05-13 MED ORDER — RIVAROXABAN 20 MG PO TABS
20.0000 mg | ORAL_TABLET | Freq: Every day | ORAL | Status: DC
  Administered 2013-05-13 – 2013-05-19 (×7): 20 mg via ORAL
  Filled 2013-05-13 (×9): qty 1

## 2013-05-13 MED ORDER — VITAMIN D (ERGOCALCIFEROL) 1.25 MG (50000 UNIT) PO CAPS
50000.0000 [IU] | ORAL_CAPSULE | ORAL | Status: DC
  Administered 2013-05-19: 50000 [IU] via ORAL
  Filled 2013-05-13: qty 1

## 2013-05-13 MED ORDER — POTASSIUM CHLORIDE CRYS ER 20 MEQ PO TBCR
20.0000 meq | EXTENDED_RELEASE_TABLET | Freq: Every morning | ORAL | Status: DC
  Administered 2013-05-14 – 2013-05-20 (×7): 20 meq via ORAL
  Filled 2013-05-13 (×7): qty 1

## 2013-05-13 MED ORDER — HYDROMORPHONE HCL PF 2 MG/ML IJ SOLN
2.0000 mg | Freq: Once | INTRAMUSCULAR | Status: AC
Start: 2013-05-13 — End: 2013-05-13

## 2013-05-13 MED ORDER — HYDROXYUREA 500 MG PO CAPS
500.0000 mg | ORAL_CAPSULE | Freq: Two times a day (BID) | ORAL | Status: DC
Start: 2013-05-14 — End: 2013-05-13

## 2013-05-13 MED ORDER — DEXTROSE-NACL 5-0.45 % IV SOLN
INTRAVENOUS | Status: DC
  Administered 2013-05-13 – 2013-05-19 (×5): via INTRAVENOUS

## 2013-05-13 MED ORDER — DIPHENHYDRAMINE HCL 50 MG/ML IJ SOLN
25.0000 mg | INTRAMUSCULAR | Status: DC | PRN
  Filled 2013-05-13: qty 0.5

## 2013-05-13 MED ORDER — POTASSIUM CHLORIDE CRYS ER 20 MEQ PO TBCR
20.0000 meq | EXTENDED_RELEASE_TABLET | Freq: Once | ORAL | Status: AC
  Administered 2013-05-13: 20 meq via ORAL
  Filled 2013-05-13: qty 1

## 2013-05-13 MED ORDER — ENOXAPARIN SODIUM 40 MG/0.4ML ~~LOC~~ SOLN: 40.0000 mg | SUBCUTANEOUS | Status: DC

## 2013-05-13 MED ORDER — FOLIC ACID 1 MG PO TABS
1.0000 mg | ORAL_TABLET | Freq: Every day | ORAL | Status: DC
  Administered 2013-05-13 – 2013-05-20 (×8): 1 mg via ORAL
  Filled 2013-05-13 (×8): qty 1

## 2013-05-13 MED ORDER — HYDROXYUREA 500 MG PO CAPS
500.0000 mg | ORAL_CAPSULE | Freq: Two times a day (BID) | ORAL | Status: DC
  Administered 2013-05-13 – 2013-05-15 (×5): 500 mg via ORAL
  Filled 2013-05-13 (×6): qty 1

## 2013-05-13 MED ORDER — MORPHINE SULFATE ER 15 MG PO TBCR: 15.0000 mg | EXTENDED_RELEASE_TABLET | Freq: Two times a day (BID) | ORAL | Status: DC

## 2013-05-13 MED ORDER — MORPHINE SULFATE ER 15 MG PO TBCR
15.0000 mg | EXTENDED_RELEASE_TABLET | Freq: Two times a day (BID) | ORAL | Status: DC
  Administered 2013-05-13 – 2013-05-20 (×14): 15 mg via ORAL
  Filled 2013-05-13 (×14): qty 1

## 2013-05-13 MED ORDER — CHLORHEXIDINE GLUCONATE CLOTH 2 % EX PADS
6.0000 | MEDICATED_PAD | Freq: Every day | CUTANEOUS | Status: AC
  Administered 2013-05-14 – 2013-05-18 (×5): 6 via TOPICAL

## 2013-05-13 NOTE — Discharge Summary (Signed)
Patient is seen and examined. Case has been discussed with NP. I agree with the notes above

## 2013-05-13 NOTE — Progress Notes (Signed)
Pt transferred via wheelchair with oxygen at 3L to the stepdown unit at 1800hrs to room 1228. Report given to [facility] on the floor.  Pt awake, alert and oriented x 4, pt states that he is still in pain a 8/10 to the rib and back area.

## 2013-05-13 NOTE — H&P (Signed)
[name] is an 33 y.o. male.   Chief Complaint: Pain all over HPI: Patient is a 33 year old gentleman well known to our service who initially presented to the sickle cell the hospital with pain all over but more localized to his legs and lower back. He was initiated on IV Dilaudid PCA and Toradol as well as supportive care. He continued to have more pain rated as 8/10 not getting any better. Also having severe itching the pain was not relieved by medication and was exacerbated by any movement. He has some shortness of breath but denies cough. He was recently admitted into the hospital which time he had acute chest syndrome. He has apparently been sick for 2 weeks but decided not to come in because he was waiting for his birthday which was this past weekend. He planned and as executed a party at the time. He has been drinking excessively recently including this past weekend where he drank vodka. Oral these was apparently have made him have more pain. He's been cc taken all his medications at home although objective findings have shown he is not taking his hydroxyurea. Patient was evaluated now and had oxygen saturations between 80-90% on room air, and has chest x-ray indicated acute chest syndrome hence he is being admitted to the hospital.  Past Medical History  Diagnosis Date  . Sickle cell anemia   . Blood transfusion   . 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 Hypokalemia   . Mood disorder   . History of pulmonary embolus (PE)   . Avascular necrosis   . Leukocytosis     Chronic  . Thrombocytosis     Chronic  . Hypertension   . History of Clostridium difficile infection   . Uses marijuana   . Chronic anticoagulation   . Functional asplenia   . Former smoker   . Second hand tobacco smoke exposure   . Alcohol consumption of one to four drinks per day     Past Surgical History  Procedure Laterality Date  . Right hip replacement      08/2006  .  Cholecystectomy      01/2008  . Porta cath placement    . Porta cath removal    . Umbilical hernia repair      01/2008  . Excision of left periauricular cyst      10/2009  . Excision of right ear lobe cyst with primary closur      11/2007  . Portacath placement  01/05/2012    Procedure: INSERTION PORT-A-CATH;  Surgeon: Odis Hollingshead, MD;  Location: [facility];  Service: General;  Laterality: N/A;  ultrasound guiced port a cath insertion with fluoroscopy    Family History  Problem Relation Age of Onset  . Sickle cell anemia Mother   . Sickle cell anemia Father   . Sickle cell trait Brother    Social History:  reports that he quit smoking about 2 years ago. His smoking use included Cigarettes. He smoked 0.00 packs per day for 13 years. He has never used smokeless tobacco. He reports that he drinks about 6 ounces of alcohol per week. He reports that he uses illicit drugs (Marijuana).  Allergies:  Allergies  Allergen Reactions  . Morphine And Related Hives and Rash    Pt states he can take PO, but not IV and he is able to tolerate Dilaudid with no reactions.    Medications Prior to Admission  Medication Sig  Dispense Refill  . celecoxib (CELEBREX) 200 MG capsule Take 1 capsule (200 mg total) by mouth 2 (two) times daily.  60 capsule  1  . ergocalciferol (VITAMIN D2) 50000 UNITS capsule Take 50,000 Units by mouth once a week. On Tuesday      . folic acid (FOLVITE) 1 MG tablet Take 1 tablet (1 mg total) by mouth every morning.  30 tablet  11  . HYDROmorphone (DILAUDID) 4 MG tablet Take 1 tablet (4 mg total) by mouth every 4 (four) hours as needed for severe pain.  90 tablet  0  . hydroxyurea (HYDREA) 500 MG capsule Take 1 capsule (500 mg total) by mouth 2 (two) times daily with a meal. May take with food to minimize GI side effects.  60 capsule  0  . morphine (MS CONTIN) 15 MG 12 hr tablet Take 1 tablet (15 mg total) by mouth 2 (two) times daily.  60 tablet  0  . potassium chloride SA  (K-DUR,KLOR-CON) 20 MEQ tablet Take 1 tablet (20 mEq total) by mouth every morning.  30 tablet  3  . Rivaroxaban (XARELTO) 20 MG TABS tablet Take 20 mg by mouth daily with supper.      . zolpidem (AMBIEN) 10 MG tablet Take 1 tablet (10 mg total) by mouth at bedtime as needed for sleep.  30 tablet  0    Results for orders placed during the hospital encounter of 05/13/13 (from the past 48 hour(s))  CBC WITH DIFFERENTIAL     Status: Abnormal   Collection Time    05/13/13  9:00 AM      Result Value Ref Range   WBC 19.9 (*) 4.0 - 10.5 K/uL   RBC 2.63 (*) 4.22 - 5.81 MIL/uL   Hemoglobin 8.3 (*) 13.0 - 17.0 g/dL   HCT 24.7 (*) 39.0 - 52.0 %   MCV 93.9  78.0 - 100.0 fL   MCH 31.6  26.0 - 34.0 pg   MCHC 33.6  30.0 - 36.0 g/dL   RDW 22.9 (*) 11.5 - 15.5 %   Platelets 473 (*) 150 - 400 K/uL   Neutrophils Relative % 74  43 - 77 %   Lymphocytes Relative 11 (*) 12 - 46 %   Monocytes Relative 12  3 - 12 %   Eosinophils Relative 2  0 - 5 %   Basophils Relative 1  0 - 1 %   nRBC 4 (*) 0 /100 WBC   Neutro Abs 14.7 (*) 1.7 - 7.7 K/uL   Lymphs Abs 2.2  0.7 - 4.0 K/uL   Monocytes Absolute 2.4 (*) 0.1 - 1.0 K/uL   Eosinophils Absolute 0.4  0.0 - 0.7 K/uL   Basophils Absolute 0.2 (*) 0.0 - 0.1 K/uL   RBC Morphology RARE NRBCs     Comment: TARGET CELLS     HOWELL/JOLLY BODIES     SICKLE CELLS     STOMATOCYTES     MARKED POLYCHROMASIA   WBC Morphology VACUOLATED NEUTROPHILS     Smear Review LARGE PLATELETS PRESENT    RETICULOCYTES     Status: Abnormal   Collection Time    05/13/13  9:00 AM      Result Value Ref Range   Retic Ct Pct >23.0 (*) 0.4 - 3.1 %   Comment: RESULTS CONFIRMED BY MANUAL DILUTION   RBC. 2.63 (*) 4.22 - 5.81 MIL/uL   Retic Count, Manual NOT CALCULATED  19.0 - 186.0 K/uL  COMPREHENSIVE METABOLIC PANEL  Status: Abnormal   Collection Time    05/13/13  9:00 AM      Result Value Ref Range   Sodium 139  137 - 147 mEq/L   Potassium 3.3 (*) 3.7 - 5.3 mEq/L   Chloride 104  96  - 112 mEq/L   CO2 22  19 - 32 mEq/L   Glucose, Bld 100 (*) 70 - 99 mg/dL   BUN 5 (*) 6 - 23 mg/dL   Creatinine, Ser 0.56  0.50 - 1.35 mg/dL   Calcium 9.1  8.4 - 10.5 mg/dL   Total Protein 7.6  6.0 - 8.3 g/dL   Albumin 4.1  3.5 - 5.2 g/dL   AST 37  0 - 37 U/L   ALT 19  0 - 53 U/L   Alkaline Phosphatase 95  39 - 117 U/L   Total Bilirubin 5.9 (*) 0.3 - 1.2 mg/dL   GFR calc non Af Amer >90  >90 mL/min   GFR calc Af Amer >90  >90 mL/min   Comment: (NOTE)     The eGFR has been calculated using the CKD EPI equation.     This calculation has not been validated in all clinical situations.     eGFR's persistently <90 mL/min signify possible Chronic Kidney     Disease.   Dg Chest 2 View  05/13/2013   CLINICAL DATA:  Weakness, fatigue and body aches. Sickle cell disease.  EXAM: CHEST  2 VIEW  COMPARISON:  Chest x-ray 04/09/2013.  FINDINGS: Mild diffuse interstitial prominence and patchy airspace opacities, most notably in the right lower lobe, concerning for acute chest syndrome in this patient history of sickle cell disease. No pleural effusion. No definite evidence of pulmonary edema. Heart size remains mildly enlarged. Upper mediastinal contours are within normal limits. Left subclavian single-lumen power porta cath with tip terminating at the superior cavoatrial junction. Surgical clips project over the right upper quadrant of the abdomen, compatible with prior cholecystectomy.  IMPRESSION: 1. Interval development of interstitial and patchy airspace opacities concerning for acute chest syndrome in this patient with history of sickle cell disease. Clinical correlation is recommended. 2. Mild cardiomegaly (unchanged).   Electronically Signed   By: Vinnie Langton M.D.   On: 05/13/2013 16:46    Review of Systems  Constitutional: Positive for fever and chills.  HENT: Negative.   Eyes: Negative.   Respiratory: Positive for cough and shortness of breath. Negative for hemoptysis and wheezing.    Cardiovascular: Positive for chest pain. Negative for palpitations, leg swelling and PND.  Gastrointestinal: Negative.   Genitourinary: Negative.   Musculoskeletal: Positive for back pain, joint pain and myalgias. Negative for falls.  Skin: Negative.   Neurological: Negative.   Endo/Heme/Allergies: Negative.   Psychiatric/Behavioral: Positive for depression. The patient is nervous/anxious.     Blood pressure 132/87, pulse 88, temperature 98.9 F (37.2 C), temperature source Oral, resp. rate 22, height 6' (1.829 m), weight 78.019 kg (172 lb), SpO2 92.00%. Physical Exam  Constitutional: He is oriented to person, place, and time. He appears well-developed and well-nourished.  HENT:  Head: Normocephalic and atraumatic.  Right Ear: External ear normal.  Eyes: Conjunctivae and EOM are normal. Pupils are equal, round, and reactive to light.  Neck: Normal range of motion. Neck supple.  Cardiovascular: Normal rate, regular rhythm, normal heart sounds and intact distal pulses.   Respiratory: No respiratory distress. He has rales. He exhibits tenderness.  GI: Soft. Bowel sounds are normal.  Musculoskeletal: Normal range of motion.  Neurological: He is alert and oriented to person, place, and time.  Skin: Skin is warm and dry. No rash noted. No erythema.  Psychiatric: He has a normal mood and affect.     Assessment/Plan A 33 year old gentleman admitted with acute chest syndrome on acute sickle cell crisis.  #1 acute chest syndrome: Patient will be admitted to the ICU. We will treat him for healthcare associated pneumonia since he was just recently discharged from the hospital. Continue supportive care with oxygen as well as pain control.  #2 healthcare associated pneumonia: Patient will be on triple antibiotics with vancomycin Levaquin and cefepime. We'll get sputum culture and blood cultures x2.  #3 sickle cell painful crisis: Patient will be maintained on IV Dilaudid PCA, Toradol, IV  hydration.   #4 dehydration: Most likely exacerbated by his drinking excessively. This may have caused worsening crisis.   #5 sickle cell anemia: We will monitor patient for evidence of hemolysis. Monitor his H&H  #6 hypokalemia: Will replete his potassium.  #7 chronic anticoagulation: Continue in the hospital. Watch for any bleeding signs.  #8 mood disorder: Patient's symptoms be doing fine at this point. We'll continue his home medications  Elwyn Reach 05/13/2013, 6:50 PM

## 2013-05-13 NOTE — Telephone Encounter (Signed)
As per DR Jonelle Sidle, ok for pt to come to the day hospital. Called pt to notify him and he states he will be there in 5-10 minutes

## 2013-05-13 NOTE — H&P (Signed)
Patient is seen and examined. Agree with his care plan as indicated above

## 2013-05-13 NOTE — Discharge Summary (Signed)
[facility] Discharge Summary   Patient ID: [name] MRN: [id number] DOB/AGE: [birth year] 33 y.o.  Admit date: 05/13/2013 Discharge date: 05/13/2013  Primary Care Physician:  [name],[name], MD  Admission Diagnoses:  Active Problems:   Mood disorder   Chronic anticoagulation   Sickle cell crisis   Acute sickle cell crisis   Hypokalemia   Former smoker   Discharge Diagnoses:   Sickle cell anemia with associated acute chest syndrome  Discharge Medications:    Medication List    ASK your doctor about these medications       celecoxib 200 MG capsule  Commonly known as:  CELEBREX  Take 1 capsule (200 mg total) by mouth 2 (two) times daily.     ergocalciferol 50000 UNITS capsule  Commonly known as:  VITAMIN D2  Take 50,000 Units by mouth once a week. On Tuesday     folic acid 1 MG tablet  Commonly known as:  FOLVITE  Take 1 tablet (1 mg total) by mouth every morning.     HYDROmorphone 4 MG tablet  Commonly known as:  DILAUDID  Take 1 tablet (4 mg total) by mouth every 4 (four) hours as needed for severe pain.     hydroxyurea 500 MG capsule  Commonly known as:  HYDREA  Take 1 capsule (500 mg total) by mouth 2 (two) times daily with a meal. May take with food to minimize GI side effects.     morphine 15 MG 12 hr tablet  Commonly known as:  MS CONTIN  Take 1 tablet (15 mg total) by mouth 2 (two) times daily.     potassium chloride SA 20 MEQ tablet  Commonly known as:  K-DUR,KLOR-CON  Take 1 tablet (20 mEq total) by mouth every morning.     XARELTO 20 MG Tabs tablet  Generic drug:  rivaroxaban  Take 20 mg by mouth daily with supper.     zolpidem 10 MG tablet  Commonly known as:  AMBIEN  Take 1 tablet (10 mg total) by mouth at bedtime as needed for sleep.         Consults:  None  Significant Diagnostic Studies:  Dg Chest 2 View  05/13/2013   CLINICAL DATA:  Weakness, fatigue and body aches. Sickle cell disease.  EXAM: CHEST  2  VIEW  COMPARISON:  Chest x-ray 04/09/2013.  FINDINGS: Mild diffuse interstitial prominence and patchy airspace opacities, most notably in the right lower lobe, concerning for acute chest syndrome in this patient history of sickle cell disease. No pleural effusion. No definite evidence of pulmonary edema. Heart size remains mildly enlarged. Upper mediastinal contours are within normal limits. Left subclavian single-lumen power porta cath with tip terminating at the superior cavoatrial junction. Surgical clips project over the right upper quadrant of the abdomen, compatible with prior cholecystectomy.  IMPRESSION: 1. Interval development of interstitial and patchy airspace opacities concerning for acute chest syndrome in this patient with history of sickle cell disease. Clinical correlation is recommended. 2. Mild cardiomegaly (unchanged).   Electronically Signed   By: Vinnie Langton M.D.   On: 05/13/2013 16:46     [facility] Course: 1. Sickle cell pain crisis: Patient was admitted to sickle cell center with 9/10 pain intensity to bilateral ribs, lower back, and thighs. Was placed on weight based dilaudid PCA per protocol. Pain intensity remained above 8/10 during extended observation. Restarted patient's home medications while in clinic.  Patient was given 2 mg Dilaudid, clinician assisted  dose, prior to chest x-ray with minimal improvement. During extended observation, patient used a total of 26 mg, with 32 deliveries, and 38 demands.   2. Bilateral rib pain: Patient appears agitated and pain intensity to bilateral ribs is elevated at 9/10.  Patient was sent down for stat chest x-ray, which is consistent with acute chest syndrome. Patient is currently on 3 liters of oxygen and oxygen saturation ranges from 87-92, respirations 34. Patient will be transferred to inpatient step down unit for a higher level of care.   Plan reviewed and outlined with Dr. Jonelle Sidle, will evaluate on the inpatient  unit.    Physical Exam at Discharge:  BP 132/87  Pulse 88  Temp(Src) 98.9 F (37.2 C) (Oral)  Resp 22  Ht 6' (1.829 m)  Wt 172 lb (78.019 kg)  BMI 23.32 kg/m2  SpO2 92% General appearance: alert, cooperative, fatigued and moderate distress  Head: Normocephalic, without obvious abnormality, atraumatic  Eyes: positive findings: sclera icterus  Ears: normal TM's and external ear canals both ears  Nose: Nares normal. Septum midline. Mucosa normal. No drainage or sinus tenderness.  Throat: lips, mucosa, and tongue normal; teeth and gums normal  Neck: no adenopathy, no carotid bruit, no JVD, supple, symmetrical, trachea midline and thyroid not enlarged, symmetric, no tenderness/mass/nodules  Back: symmetric, no curvature. ROM normal. No CVA tenderness., Mid back tenderness, with decreased range of motion  Lungs: diminished breath sounds posterior - bilateral  Chest wall: right sided chest wall tenderness, left sided chest wall tenderness  Heart: regularly irregular rhythm  Skin: Moist, diaphoretic  Abdomen: soft, non-tender; bowel sounds normal; no masses, no organomegaly  Extremities: extremities normal, atraumatic, no cyanosis or edema  Pulses: 2+ and symmetric  Skin: Skin color, texture, turgor normal. No rashes or lesions  Neurologic: Alert and oriented X 3, normal strength and tone. Normal symmetric reflexes. Normal coordination and gait      Disposition at Discharge: 01-Home or Self Care  Discharge Orders:   Condition at Discharge:  Transferred to inpatient unit  Time spent on Discharge:  45 minutes  Signed: Dorena Dew 05/13/2013, 5:34 PM

## 2013-05-13 NOTE — H&P (Signed)
[facility] History and Physical   Date: 05/13/2013  Patient name: [name] Medical record number: [id number] Date of birth: 1980/07/12 Age: 33 y.o. Gender: male PCP: [name],[name], MD  Attending physician: Elwyn Reach, MD  Chief Complaint: Sickle Cell Pain  History of Present Illness: 33 year old patient with sickle cell anemia, Hb SS complaining of pain to bilateral ribs,  mid to lower back, and thighs. He states that bilateral rib pain has been present for 2 weeks. Pain intensity increased on Sunday afternoon and patient states that he was unable to manage at home on current medication regimen. Describes pain as 9/10 in intensity, sharp, throbbing, and non-radiating in nature. Pain intensity is  unrelieved by Dilaudid 4 mg, last taken at 6 am. Reports that pain worsens with movement.  Patient reports that he was celebrating his birthday by drinking alcohol throughout the weekend, patient reports that he drank 1/5th of a gallon of Vodka throughout the weekend. Reports that he did not drink fluids throughout the weekend. Patient maintains that he has not been taking medications as prescribed throughout the weekend. Patient reports fatigue and denies nausea, vomiting, diarrhea, dizziness, denies chest pains.   Meds: Prescriptions prior to admission  Medication Sig Dispense Refill  . celecoxib (CELEBREX) 200 MG capsule Take 1 capsule (200 mg total) by mouth 2 (two) times daily.  60 capsule  1  . ergocalciferol (VITAMIN D2) 50000 UNITS capsule Take 50,000 Units by mouth once a week. On Tuesday      . folic acid (FOLVITE) 1 MG tablet Take 1 tablet (1 mg total) by mouth every morning.  30 tablet  11  . HYDROmorphone (DILAUDID) 4 MG tablet Take 1 tablet (4 mg total) by mouth every 4 (four) hours as needed for severe pain.  90 tablet  0  . hydroxyurea (HYDREA) 500 MG capsule Take 1 capsule (500 mg total) by mouth 2 (two) times daily with a meal. May take with food  to minimize GI side effects.  60 capsule  0  . morphine (MS CONTIN) 15 MG 12 hr tablet Take 1 tablet (15 mg total) by mouth 2 (two) times daily.  60 tablet  0  . potassium chloride SA (K-DUR,KLOR-CON) 20 MEQ tablet Take 1 tablet (20 mEq total) by mouth every morning.  30 tablet  3  . Rivaroxaban (XARELTO) 20 MG TABS tablet Take 20 mg by mouth daily with supper.      . zolpidem (AMBIEN) 10 MG tablet Take 1 tablet (10 mg total) by mouth at bedtime as needed for sleep.  30 tablet  0    Allergies: Morphine and related Past Medical History  Diagnosis Date  . Sickle cell anemia   . Blood transfusion   . 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 Hypokalemia   . Mood disorder   . History of pulmonary embolus (PE)   . Avascular necrosis   . Leukocytosis     Chronic  . Thrombocytosis     Chronic  . Hypertension   . History of Clostridium difficile infection   . Uses marijuana   . Chronic anticoagulation   . Functional asplenia   . Former smoker   . Second hand tobacco smoke exposure   . Alcohol consumption of one to four drinks per day    Past Surgical History  Procedure Laterality Date  . Right hip replacement      08/2006  . Cholecystectomy  01/2008  . Porta cath placement    . Porta cath removal    . Umbilical hernia repair      01/2008  . Excision of left periauricular cyst      10/2009  . Excision of right ear lobe cyst with primary closur      11/2007  . Portacath placement  01/05/2012    Procedure: INSERTION PORT-A-CATH;  Surgeon: Odis Hollingshead, MD;  Location: [place];  Service: General;  Laterality: N/A;  ultrasound guiced port a cath insertion with fluoroscopy   Family History  Problem Relation Age of Onset  . Sickle cell anemia Mother   . Sickle cell anemia Father   . Sickle cell trait Brother    History   Social History  . Marital Status: Single    Spouse Name: N/A    Number of Children: 0  . Years of Education: 13    Occupational History  . 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 Smoking status: Former Smoker -- 13 years    Types: Cigarettes    Quit date: 07/08/2010  . Smokeless tobacco: Never Used  . Alcohol Use: 6.0 oz/week    10 Shots of liquor per week  . Drug Use: Yes    Special: Marijuana     Comment: quit smoking 2011  . Sexual Activity: Yes    Partners: Female    Patent examiner Protection: None   Other Topics Concern  . Not on file   Social History Narrative   Lives in an apartment.  Single.  Lives alone but has a girlfriend that helps care for him.  Does not use any assist devices.        [name]:  [phone] Mom, emergency contact    Review of Systems: Constitutional: positive for fatigue Eyes: positive for icterus Ears, nose, mouth, throat, and face: negative Respiratory: negative Cardiovascular: negative Gastrointestinal: negative Genitourinary:negative Integument/breast: negative Hematologic/lymphatic: negative Musculoskeletal:positive for arthralgias, back pain and myalgias Neurological: negative Behavioral/Psych: negative Endocrine: negative Allergic/Immunologic: negative  Physical Exam: Blood pressure 144/86, pulse 93, temperature 98.9 F (37.2 C), temperature source Oral, resp. rate 21, height 6' (1.829 m), weight 172 lb (78.019 kg), SpO2 94.00%. BP 132/87  Pulse 88  Temp(Src) 98.9 F (37.2 C) (Oral)  Resp 22  Ht 6' (1.829 m)  Wt 172 lb (78.019 kg)  BMI 23.32 kg/m2  SpO2 92% General appearance: alert, cooperative, fatigued and moderate distress Head: Normocephalic, without obvious abnormality, atraumatic Eyes: positive findings: sclera icterus Ears: normal TM's and external ear canals both ears Nose: Nares normal. Septum midline. Mucosa normal. No drainage or sinus tenderness. Throat: lips, mucosa, and tongue normal; teeth and gums normal Neck: no adenopathy, no carotid bruit, no JVD,  supple, symmetrical, trachea midline and thyroid not enlarged, symmetric, no tenderness/mass/nodules Back: symmetric, no curvature. ROM normal. No CVA tenderness., Mid back tenderness, with decreased range of motion Lungs: diminished breath sounds posterior - bilateral Chest wall: right sided chest wall tenderness, left sided chest wall tenderness Heart: regularly irregular rhythm Abdomen: soft, non-tender; bowel sounds normal; no masses,  no organomegaly Extremities: extremities normal, atraumatic, no cyanosis or edema Pulses: 2+ and symmetric Skin: Skin color, texture, turgor normal. No rashes or lesions Lymph nodes: Cervical, supraclavicular, and axillary nodes normal. Neurologic: Alert and oriented X 3, normal strength and tone. Normal symmetric reflexes. Normal coordination and gait  Lab results: Results for orders placed during  the hospital encounter of 05/13/13 (from the past 24 hour(s))  CBC WITH DIFFERENTIAL     Status: Abnormal   Collection Time    05/13/13  9:00 AM      Result Value Ref Range   WBC 19.9 (*) 4.0 - 10.5 K/uL   RBC 2.63 (*) 4.22 - 5.81 MIL/uL   Hemoglobin 8.3 (*) 13.0 - 17.0 g/dL   HCT 24.7 (*) 39.0 - 52.0 %   MCV 93.9  78.0 - 100.0 fL   MCH 31.6  26.0 - 34.0 pg   MCHC 33.6  30.0 - 36.0 g/dL   RDW 22.9 (*) 11.5 - 15.5 %   Platelets 473 (*) 150 - 400 K/uL   Neutrophils Relative % 74  43 - 77 %   Lymphocytes Relative 11 (*) 12 - 46 %   Monocytes Relative 12  3 - 12 %   Eosinophils Relative 2  0 - 5 %   Basophils Relative 1  0 - 1 %   nRBC 4 (*) 0 /100 WBC   Neutro Abs 14.7 (*) 1.7 - 7.7 K/uL   Lymphs Abs 2.2  0.7 - 4.0 K/uL   Monocytes Absolute 2.4 (*) 0.1 - 1.0 K/uL   Eosinophils Absolute 0.4  0.0 - 0.7 K/uL   Basophils Absolute 0.2 (*) 0.0 - 0.1 K/uL   RBC Morphology RARE NRBCs     WBC Morphology VACUOLATED NEUTROPHILS     Smear Review LARGE PLATELETS PRESENT    RETICULOCYTES     Status: Abnormal   Collection Time    05/13/13  9:00 AM      Result  Value Ref Range   Retic Ct Pct >23.0 (*) 0.4 - 3.1 %   RBC. 2.63 (*) 4.22 - 5.81 MIL/uL   Retic Count, Manual NOT CALCULATED  19.0 - 186.0 K/uL  COMPREHENSIVE METABOLIC PANEL     Status: Abnormal   Collection Time    05/13/13  9:00 AM      Result Value Ref Range   Sodium 139  137 - 147 mEq/L   Potassium 3.3 (*) 3.7 - 5.3 mEq/L   Chloride 104  96 - 112 mEq/L   CO2 22  19 - 32 mEq/L   Glucose, Bld 100 (*) 70 - 99 mg/dL   BUN 5 (*) 6 - 23 mg/dL   Creatinine, Ser 0.56  0.50 - 1.35 mg/dL   Calcium 9.1  8.4 - 10.5 mg/dL   Total Protein 7.6  6.0 - 8.3 g/dL   Albumin 4.1  3.5 - 5.2 g/dL   AST 37  0 - 37 U/L   ALT 19  0 - 53 U/L   Alkaline Phosphatase 95  39 - 117 U/L   Total Bilirubin 5.9 (*) 0.3 - 1.2 mg/dL   GFR calc non Af Amer >90  >90 mL/min   GFR calc Af Amer >90  >90 mL/min    Imaging results:  No results found.   Assessment & Plan: 1. Sickle cell pain:  Patient admitted to sickle cell hospital for extended observation. Started on weight based PCA dilaudid per protocol with loading dose. Initiated IV Toradol.  Apply K-pad to bilateral ribs. Review Labs: CBC w/differential, CMP, and reticulocytes.   2. Fatigue: Will review laboratory values  3. Mild dehydration: Start Hypotonic IVFs. D5.45 @125    Dorena Dew 05/13/2013, 11:32 AM

## 2013-05-13 NOTE — Telephone Encounter (Signed)
Pt called stating that he has been in pain for the past 2 weeks. Back and rib pain 8/10 on the pain scale.  States he has taken his oral dilaudid and MS contin this morning with no relief.  Will notify on call doctor

## 2013-05-13 NOTE — Progress Notes (Signed)
ANTIBIOTIC CONSULT NOTE - INITIAL  Pharmacy Consult for Vancomycin, Renal adjustment to antibiotics Indication: pneumonia  Allergies  Allergen Reactions  . Morphine And Related Hives and Rash    Pt states he can take PO, but not IV and he is able to tolerate Dilaudid with no reactions.    Patient Measurements: Height: 6' (182.9 cm) Weight: 172 lb (78.019 kg) IBW/kg (Calculated) : 77.6   Vital Signs: Temp: 98.9 F (37.2 C) (04/13 1619) Temp src: Oral (04/13 0835) BP: 132/87 mmHg (04/13 1724) Pulse Rate: 88 (04/13 1724) Intake/Output from previous day:   Intake/Output from this shift:    Labs:  Recent Labs  05/13/13 0900  WBC 19.9*  HGB 8.3*  PLT 473*  CREATININE 0.56   Estimated Creatinine Clearance: 142.8 ml/min (by C-G formula based on Cr of 0.56). No results found for this basename: VANCOTROUGH, VANCOPEAK, VANCORANDOM, GENTTROUGH, GENTPEAK, GENTRANDOM, TOBRATROUGH, TOBRAPEAK, TOBRARND, AMIKACINPEAK, AMIKACINTROU, AMIKACIN,  in the last 72 hours   Microbiology: No results found for this or any previous visit (from the past 720 hour(s)).  Medical History: Past Medical History  Diagnosis Date  . Sickle cell anemia   . Blood transfusion   . 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 Hypokalemia   . Mood disorder   . History of pulmonary embolus (PE)   . Avascular necrosis   . Leukocytosis     Chronic  . Thrombocytosis     Chronic  . Hypertension   . History of Clostridium difficile infection   . Uses marijuana   . Chronic anticoagulation   . Functional asplenia   . Former smoker   . Second hand tobacco smoke exposure   . Alcohol consumption of one to four drinks per day      Assessment: 54 yoM admitted with acute chest syndrome, sickle cell crisis.  Empirically treating for HCAP due to recent hospitalization.    Afebrile  WBC 19.9  SCr 0.56, CrCl>100 ml/min  Blood and sputum cultures ordered.  Goal of Therapy:   Vancomycin trough level 15-20 mcg/ml  Plan:  1.  Vancomycin 1500 mg IV loading dose then IV q8h. 2.  Continue with Cefepime 1g IV q8h as ordered. 3.  F/u SCr, trough level, clinical course, narrowing of antibiotic therapy and if HCAP can be ruled out.  [name] 05/13/2013,7:12 PM

## 2013-05-14 ENCOUNTER — Inpatient Hospital Stay (HOSPITAL_COMMUNITY): Payer: Medicare Other

## 2013-05-14 DIAGNOSIS — D5701 Hb-SS disease with acute chest syndrome: Secondary | ICD-10-CM | POA: Diagnosis not present

## 2013-05-14 DIAGNOSIS — D5700 Hb-SS disease with crisis, unspecified: Secondary | ICD-10-CM | POA: Diagnosis not present

## 2013-05-14 DIAGNOSIS — D72829 Elevated white blood cell count, unspecified: Secondary | ICD-10-CM

## 2013-05-14 DIAGNOSIS — J811 Chronic pulmonary edema: Secondary | ICD-10-CM | POA: Diagnosis not present

## 2013-05-14 LAB — BLOOD GAS, ARTERIAL
ACID-BASE DEFICIT: 4.2 mmol/L — AB (ref 0.0–2.0)
Bicarbonate: 20.2 mEq/L (ref 20.0–24.0)
DRAWN BY: 317871
O2 Content: 4 L/min
O2 SAT: 94 %
PATIENT TEMPERATURE: 98.6
TCO2: 19.3 mmol/L (ref 0–100)
pCO2 arterial: 36.4 mmHg (ref 35.0–45.0)
pH, Arterial: 7.363 (ref 7.350–7.450)
pO2, Arterial: 90.7 mmHg (ref 80.0–100.0)

## 2013-05-14 LAB — CBC WITH DIFFERENTIAL/PLATELET
BAND NEUTROPHILS: 0 % (ref 0–10)
Basophils Absolute: 0 10*3/uL (ref 0.0–0.1)
Basophils Relative: 0 % (ref 0–1)
Blasts: 0 %
Eosinophils Absolute: 1.7 10*3/uL — ABNORMAL HIGH (ref 0.0–0.7)
Eosinophils Relative: 7 % — ABNORMAL HIGH (ref 0–5)
HEMATOCRIT: 23.3 % — AB (ref 39.0–52.0)
Hemoglobin: 7.9 g/dL — ABNORMAL LOW (ref 13.0–17.0)
Lymphocytes Relative: 17 % (ref 12–46)
Lymphs Abs: 4 10*3/uL (ref 0.7–4.0)
MCH: 31.7 pg (ref 26.0–34.0)
MCHC: 33.9 g/dL (ref 30.0–36.0)
MCV: 93.6 fL (ref 78.0–100.0)
METAMYELOCYTES PCT: 0 %
MONO ABS: 1.4 10*3/uL — AB (ref 0.1–1.0)
Monocytes Relative: 6 % (ref 3–12)
Myelocytes: 0 %
Neutro Abs: 16.5 10*3/uL — ABNORMAL HIGH (ref 1.7–7.7)
Neutrophils Relative %: 70 % (ref 43–77)
PLATELETS: 464 10*3/uL — AB (ref 150–400)
Promyelocytes Absolute: 0 %
RBC: 2.49 MIL/uL — ABNORMAL LOW (ref 4.22–5.81)
RDW: 23.5 % — AB (ref 11.5–15.5)
WBC: 23.6 10*3/uL — ABNORMAL HIGH (ref 4.0–10.5)
nRBC: 3 /100 WBC — ABNORMAL HIGH

## 2013-05-14 LAB — LEGIONELLA ANTIGEN, URINE: Legionella Antigen, Urine: NEGATIVE

## 2013-05-14 LAB — COMPREHENSIVE METABOLIC PANEL
ALT: 17 U/L (ref 0–53)
AST: 32 U/L (ref 0–37)
Albumin: 4 g/dL (ref 3.5–5.2)
Alkaline Phosphatase: 83 U/L (ref 39–117)
BILIRUBIN TOTAL: 5.2 mg/dL — AB (ref 0.3–1.2)
BUN: 5 mg/dL — ABNORMAL LOW (ref 6–23)
CHLORIDE: 104 meq/L (ref 96–112)
CO2: 22 meq/L (ref 19–32)
CREATININE: 0.54 mg/dL (ref 0.50–1.35)
Calcium: 8.8 mg/dL (ref 8.4–10.5)
GFR calc Af Amer: >90 mL/min (ref >90)
Glucose, Bld: 103 mg/dL — ABNORMAL HIGH (ref 70–99)
Potassium: 3.6 mEq/L — ABNORMAL LOW (ref 3.7–5.3)
Sodium: 138 mEq/L (ref 137–147)
Total Protein: 7.2 g/dL (ref 6.0–8.3)

## 2013-05-14 LAB — PREPARE RBC (CROSSMATCH)

## 2013-05-14 LAB — STREP PNEUMONIAE URINARY ANTIGEN: STREP PNEUMO URINARY ANTIGEN: NEGATIVE

## 2013-05-14 MED ORDER — POLYETHYLENE GLYCOL 3350 17 G PO PACK
17.0000 g | PACK | Freq: Every day | ORAL | Status: DC
  Administered 2013-05-18 – 2013-05-19 (×2): 17 g via ORAL
  Filled 2013-05-14 (×7): qty 1

## 2013-05-14 NOTE — Progress Notes (Signed)
SICKLE CELL SERVICE PROGRESS NOTE  [name] GYK:[id number] DOB: October 26, 1980 DOA: 05/13/2013 PCP: [name],[name], MD  Assessment/Plan: Principal Problem:   Acute chest syndrome Active Problems:   Chronic anticoagulation   Mood disorder   Sickle cell crisis   Acute sickle cell crisis   Hypokalemia   Former smoker   Sickle-cell crisis with associated acute chest syndrome   Sickle-cell anemia with associated acute chest syndrome  1. Acute Chest Syndrome: Pt has had 2nd episode of acute chest this year. However this is a mild episode. He is still requiring oxygen and may benefit from transfusion of 1 unit of PRBC. Will T&C a for 1 unit. If still requiring oxygen in am will transfuse.  2. Hb SS with Crisis:  Will continue PCA at current dosing, Toradol and MS Contin. Pt well hydrated. Will decrease IVF to [facility]. 3. Leukocytosis: Pt has no evidence of infection. Likely secondary to crisis. Will contniue to monitor. 4. ETOH consumption: Pt has been drinking ETOH on weekends. He is at low risk for withdrawal however willl observe closely. This has been addressed with patient and he does not feel that his ETOH intake is a problem that needs to be medically addressed.However, he is willing to cut down on drinking now that he is back with his girlfriend. 5. 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 No evidence of active bleeding. 6. Chronic Opiate dependence: Pt opiate tolerant and on MS Contin for treatment of chronic pain. 7. Sickle Cell Disease: Pt has not made his follow up visit since 02/13/2013.  He has had 20 presentations for acute care in the last 6 months. He is clearly not compliant with Hydrea and unsure if compliant with folic acid. He had been doing well with his care until he had a breakup with his girlfriend at which time started drinking ETOH. HE is now back with his girlfriend and feels that with her help, he can be more compliant. 8. 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 He has no particular reason. He is aware of the risk of Iron overload to his organs up to and including death.  Code Status: Full Code Family Communication: N/A Disposition Plan: Transfer to Med-Surg floor. Not yet ready for discharge  [name]  Pager [phone]. If 7PM-7AM, please contact night-coverage.  05/14/2013, 8:06 AM  LOS: 1 day   Brief narrative: Patient is a 33 year old gentleman well known to our service who initially presented to the sickle cell the hospital with pain all over but more localized to his legs and lower back. He was initiated on IV Dilaudid PCA and Toradol as well as supportive care. He continued to have more pain rated as 8/10 not getting any better. Also having severe itching the pain was not relieved by medication and was exacerbated by any movement. He has some shortness of breath but denies cough. He was recently admitted into the hospital which time he had acute chest syndrome. He has apparently been sick for 2 weeks but decided not to come in because he was waiting for his birthday which was this past weekend. He planned and as executed a party at the time. He has been drinking excessively recently including this past weekend where he drank vodka. Oral these was apparently have made him have more pain. He's been cc taken all his medications at home although objective findings have shown he is not taking his hydroxyurea. Patient was evaluated now and had oxygen saturations between 80-90%  on room air, and has chest x-ray indicated acute chest syndrome hence he is being admitted to the hospital.   Consultants:  None  Procedures:  None  Antibiotics:  Cefipime 4/13>>  Vancomycin 4/1>>  HPI/Subjective: Pt reports that pain still 7/10 but not continuous as it was yesterday. He has not had a BM since admission. We have discussed his ETOH use and pt states that he will cut back on drinking. He drinks only on the weekends and has never  had withdrawal. He also states that he has been exposed to more 2nd had smoke lately. However he does not smoke cigarettes. He does smoke Marijuana about 2 x week.  Objective: Filed Vitals:   05/14/13 0004 05/14/13 0303 05/14/13 0400 05/14/13 0538  BP: 136/99  133/89 105/78  Pulse: 84  102 98  Temp: 98.6 F (37 C)  98.4 F (36.9 C)   TempSrc:      Resp: 15 16 15 25   Height:      Weight:      SpO2: 95% 94% 92% 89%   Weight change:   Intake/Output Summary (Last 24 hours) at 05/14/13 0806 Last data filed at 05/14/13 0600  Gross per 24 hour  Intake 3907.99 ml  Output   1801 ml  Net 2106.99 ml    General: Alert, awake, oriented x3, in no acute distress. Pt speaking in full sentences.  HEENT: [place]/AT PEERL, EOMI, anicteric OROPHARYNX:  Moist, No exudate/ erythema/lesions.  Heart: Regular rate and rhythm, without murmurs, rubs, gallops.  Lungs: mild bi-basilar crackles, no wheezing or rhonchi noted.  Abdomen: Soft, nontender, nondistended, positive bowel sounds, no masses no hepatosplenomegaly noted. Neuro: No focal neurological deficits noted cranial nerves II through XII grossly intact.  Strength normal in bilateral upper and lower extremities. Musculoskeletal: No warm swelling or erythema around joints, no spinal tenderness noted. Psychiatric: Patient alert and oriented x3, good insight and cognition, good recent to remote recall.   Data Reviewed: Basic Metabolic Panel:  Recent Labs Lab 05/10/13 0349 05/13/13 0900 05/13/13 2035 05/14/13 0545  NA 141 139  --  138  K 3.4* 3.3*  --  3.6*  CL 106 104  --  104  CO2 21 22  --  22  GLUCOSE 122* 100*  --  103*  BUN 6 5*  --  5*  CREATININE 0.63 0.56 0.57 0.54  CALCIUM 8.8 9.1  --  8.8   Liver Function Tests:  Recent Labs Lab 05/10/13 0349 05/13/13 0900 05/14/13 0545  AST 30 37 32  ALT 17 19 17   ALKPHOS 90 95 83  BILITOT 5.2* 5.9* 5.2*  PROT 7.5 7.6 7.2  ALBUMIN 4.0 4.1 4.0   No results found for this basename:  LIPASE, AMYLASE,  in the last 168 hours No results found for this basename: AMMONIA,  in the last 168 hours CBC:  Recent Labs Lab 05/10/13 0349 05/13/13 0900 05/13/13 2035 05/14/13 0545  WBC 18.5* 19.9* 21.6* 23.6*  NEUTROABS 13.0* 14.7*  --  16.5*  HGB 7.9* 8.3* 8.0* 7.9*  HCT 23.3* 24.7* 22.8* 23.3*  MCV 94.0 93.9 91.6 93.6  PLT 452* 473* 477* 464*   Cardiac Enzymes: No results found for this basename: CKTOTAL, CKMB, CKMBINDEX, TROPONINI,  in the last 168 hours BNP (last 3 results)  Recent Labs  11/13/12 1826  PROBNP 106.3   CBG: No results found for this basename: GLUCAP,  in the last 168 hours  Recent Results (from the past 240 hour(s))  MRSA PCR  SCREENING     Status: Abnormal   Collection Time    05/13/13  6:00 PM      Result Value Ref Range Status   MRSA by PCR POSITIVE (*) NEGATIVE Final   Comment:            The GeneXpert MRSA Assay (FDA     approved for NASAL specimens     only), is one component of a     comprehensive MRSA colonization     surveillance program. It is not     intended to diagnose MRSA     infection nor to guide or     monitor treatment for     MRSA infections.     RESULT CALLED TO, READ BACK BY AND VERIFIED WITH:     C.DENNY,RN AT 2320 ON 05/13/13 BY W.SHEA     Studies: Dg Chest 2 View  05/13/2013   CLINICAL DATA:  Weakness, fatigue and body aches. Sickle cell disease.  EXAM: CHEST  2 VIEW  COMPARISON:  Chest x-ray 04/09/2013.  FINDINGS: Mild diffuse interstitial prominence and patchy airspace opacities, most notably in the right lower lobe, concerning for acute chest syndrome in this patient history of sickle cell disease. No pleural effusion. No definite evidence of pulmonary edema. Heart size remains mildly enlarged. Upper mediastinal contours are within normal limits. Left subclavian single-lumen power porta cath with tip terminating at the superior cavoatrial junction. Surgical clips project over the right upper quadrant of the abdomen,  compatible with prior cholecystectomy.  IMPRESSION: 1. Interval development of interstitial and patchy airspace opacities concerning for acute chest syndrome in this patient with history of sickle cell disease. Clinical correlation is recommended. 2. Mild cardiomegaly (unchanged).   Electronically Signed   By: Vinnie Langton M.D.   On: 05/13/2013 16:46    Scheduled Meds: . ceFEPime (MAXIPIME) IV  1 g Intravenous 3 times per day  . Chlorhexidine Gluconate Cloth  6 each Topical Q0600  . folic acid  1 mg Oral Daily  . HYDROmorphone PCA 0.3 mg/mL   Intravenous 6 times per day  . hydroxyurea  500 mg Oral BID  . ketorolac  15 mg Intravenous 4 times per day  . morphine  15 mg Oral BID  . mupirocin ointment  1 application Nasal BID  . potassium chloride SA  20 mEq Oral q morning - 10a  . rivaroxaban  20 mg Oral Q supper  . senna  1 tablet Oral q12n4p  . vancomycin  1,000 mg Intravenous Q8H  . [START ON 05/19/2013] Vitamin D (Ergocalciferol)  50,000 Units Oral Weekly   Continuous Infusions: . dextrose 5 % and 0.45% NaCl 125 mL/hr at 05/14/13 2694    Principal Problem:   Acute chest syndrome Active Problems:   Chronic anticoagulation   Mood disorder   Sickle cell crisis   Acute sickle cell crisis   Hypokalemia   Former smoker   Sickle-cell crisis with associated acute chest syndrome   Sickle-cell anemia with associated acute chest syndrome

## 2013-05-14 NOTE — Progress Notes (Signed)
ANTIBIOTIC CONSULT NOTE - INITIAL  Pharmacy Consult for Vancomycin, Renal adjustment to antibiotics Indication: pneumonia  Allergies  Allergen Reactions  . Morphine And Related Hives and Rash    Pt states he can take PO, but not IV and he is able to tolerate Dilaudid with no reactions.    Patient Measurements: Height: 6' (182.9 cm) Weight: 169 lb 12.1 oz (77 kg) IBW/kg (Calculated) : 77.6   Assessment: 76 yoM admitted with acute chest syndrome, sickle cell crisis.  Empirically treating for HCAP due to recent hospitalization.    Antiinfectives 4/13 >> Cefepime >> (8 days) 4/13 >> Vanc >> (8 days)   Labs / vitals Tmax: Afebrile WBC: 23.6 Renal: SCr 0.54- stable, CrCl>100 N/CG  Microbiology 4/13 Blood x2: sent 4/13 sputum: ordered 4/13 S pneumo Ur Ag: (-) 4/13 Legionella Ur Ag: sent   Goal of Therapy:  Vancomycin trough level 15-20 mcg/ml  Plan:  - continue Vancomycin 1g IV q8h and Cefepime 1g IV q8h - vancomycin trough prior to 0400 dose on 4/15 with q8h dosing - follow-up clinical course, culture results, renal function - follow-up antibiotic de-escalation and length of therapy  Thank you for the consult.  Johny Drilling, PharmD, BCPS Pager: [phone] Pharmacy: [phone] 05/14/2013 1:53 PM

## 2013-05-14 NOTE — Progress Notes (Signed)
CARE MANAGEMENT NOTE 05/14/2013  Patient:  [name], [name]   Account Number:  [id number]  Date Initiated:  05/14/2013  Documentation initiated by:  Katelynd Blauvelt  Subjective/Objective Assessment:   pt with hx of sickle cell now with pain and elevated wbc, o2 sat in the low 80's pain not controlled by home meds.     Action/Plan:   home when stable   Anticipated DC Date:  05/17/2013   Anticipated DC Plan:  HOME/SELF CARE  In-house referral  NA      DC Planning Services  NA      PAC Choice  NA   Choice offered to / List presented to:  NA   DME arranged  NA      DME agency  NA     [place] arranged  NA      [place] agency  NA   Status of service:  In process, will continue to follow Medicare Important Message given?  NA - LOS <3 / Initial given by admissions (If response is "NO", the following Medicare IM given date fields will be blank) Date Medicare IM given:   Date Additional Medicare IM given:    Discharge Disposition:    Per UR Regulation:  Reviewed for med. necessity/level of care/duration of stay  If discussed at [place] of Stay Meetings, dates discussed:    Comments:  [id number]/Mekaela Azizi Eldridge Dace, [place], [place] [phone] Chart Reviewed for discharge and hospital needs. Discharge needs at time of review: None present will follow for needs. Review of patient progress due on [id number].

## 2013-05-15 ENCOUNTER — Ambulatory Visit: Payer: Medicare Other | Admitting: Family Medicine

## 2013-05-15 LAB — CBC WITH DIFFERENTIAL/PLATELET
Basophils Absolute: 0.1 10*3/uL (ref 0.0–0.1)
Basophils Relative: 1 % (ref 0–1)
EOS ABS: 1.1 10*3/uL — AB (ref 0.0–0.7)
Eosinophils Relative: 6 % — ABNORMAL HIGH (ref 0–5)
HCT: 22.6 % — ABNORMAL LOW (ref 39.0–52.0)
Hemoglobin: 7.9 g/dL — ABNORMAL LOW (ref 13.0–17.0)
LYMPHS ABS: 3.7 10*3/uL (ref 0.7–4.0)
Lymphocytes Relative: 18 % (ref 12–46)
MCH: 31.5 pg (ref 26.0–34.0)
MCHC: 35 g/dL (ref 30.0–36.0)
MCV: 90 fL (ref 78.0–100.0)
Monocytes Absolute: 2.4 10*3/uL — ABNORMAL HIGH (ref 0.1–1.0)
Monocytes Relative: 12 % (ref 3–12)
NEUTROS PCT: 64 % (ref 43–77)
Neutro Abs: 13 10*3/uL — ABNORMAL HIGH (ref 1.7–7.7)
Platelets: 526 10*3/uL — ABNORMAL HIGH (ref 150–400)
RBC: 2.51 MIL/uL — AB (ref 4.22–5.81)
RDW: 22.2 % — ABNORMAL HIGH (ref 11.5–15.5)
WBC: 20.2 10*3/uL — ABNORMAL HIGH (ref 4.0–10.5)

## 2013-05-15 LAB — COMPREHENSIVE METABOLIC PANEL
ALBUMIN: 4.2 g/dL (ref 3.5–5.2)
ALT: 19 U/L (ref 0–53)
AST: 35 U/L (ref 0–37)
Alkaline Phosphatase: 100 U/L (ref 39–117)
BUN: 7 mg/dL (ref 6–23)
CO2: 21 mEq/L (ref 19–32)
Calcium: 8.8 mg/dL (ref 8.4–10.5)
Chloride: 103 mEq/L (ref 96–112)
Creatinine, Ser: 0.57 mg/dL (ref 0.50–1.35)
GFR calc Af Amer: >90 mL/min (ref >90)
GFR calc non Af Amer: >90 mL/min (ref >90)
Glucose, Bld: 95 mg/dL (ref 70–99)
POTASSIUM: 4.1 meq/L (ref 3.7–5.3)
SODIUM: 137 meq/L (ref 137–147)
TOTAL PROTEIN: 7.3 g/dL (ref 6.0–8.3)
Total Bilirubin: 4.5 mg/dL — ABNORMAL HIGH (ref 0.3–1.2)

## 2013-05-15 LAB — VANCOMYCIN, TROUGH: Vancomycin Tr: 8.8 ug/mL — ABNORMAL LOW (ref 10.0–20.0)

## 2013-05-15 MED ORDER — VANCOMYCIN HCL 10 G IV SOLR
1250.0000 mg | Freq: Three times a day (TID) | INTRAVENOUS | Status: DC
  Administered 2013-05-15 (×2): 1250 mg via INTRAVENOUS
  Filled 2013-05-15 (×3): qty 1250

## 2013-05-15 MED ORDER — HYDROMORPHONE HCL PF 2 MG/ML IJ SOLN
2.0000 mg | Freq: Once | INTRAMUSCULAR | Status: AC
  Administered 2013-05-15: 2 mg via INTRAVENOUS
  Filled 2013-05-15: qty 1

## 2013-05-15 MED ORDER — HYDROXYUREA 500 MG PO CAPS
1000.0000 mg | ORAL_CAPSULE | Freq: Every day | ORAL | Status: DC
  Administered 2013-05-16 – 2013-05-20 (×5): 1000 mg via ORAL
  Filled 2013-05-15 (×5): qty 2

## 2013-05-15 MED ORDER — LEVOFLOXACIN 750 MG PO TABS
750.0000 mg | ORAL_TABLET | Freq: Every day | ORAL | Status: AC
  Administered 2013-05-16 – 2013-05-19 (×4): 750 mg via ORAL
  Filled 2013-05-15 (×6): qty 1

## 2013-05-15 NOTE — Progress Notes (Signed)
Rx Brief Antibiotic note:  IV Vancomycin  Assessement:  VT= 8.8 mg/L after 1Gm q8h @ [facility]  Scr stable  Plan:  Increase Vancomycin to 1250mg  IV q8h   F/U SCr/levels/cultures as needed  Dorrene German 05/15/2013 4:51 AM

## 2013-05-15 NOTE — Progress Notes (Addendum)
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 Acute Chest Syndrome: Pt has had 2nd episode of acute chest this year. However this is a mild episode. He is still requiring oxygen today so will transfuse 1 Unit PRBC. Will also discontinue IV antibiotics and place on Levaquin to complete an 8 day course of treatment.  Anticipate discharge in the next 48-72 hours. 2. Hb SS with Crisis:  Will continue PCA at current dosing, Toradol and MS Contin. Pt well hydrated. . 3. Leukocytosis:  Likely secondary to crisis. Will contniue to monitor. 4. ETOH consumption: Pt has been drinking ETOH on weekends. He is at low risk for withdrawal however willl observe closely. This has been addressed with patient and he does not feel that his ETOH intake is a problem that needs to be medically addressed.However, he is willing to cut down on drinking now that he is back with his girlfriend. 5. 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 No evidence of active bleeding. 6. Chronic Opiate dependence: Pt opiate tolerant and on MS Contin for treatment of chronic pain. 7. Sickle Cell Disease: Continue Hydrea and Folic Acid. 8. 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 He has no particular reason. He is aware of the risk of Iron overload to his organs up to and including death.  Code Status: Full Code Family Communication: N/A Disposition Plan: Transfer to Med-Surg floor. Not yet ready for discharge  [name]  Pager [phone]. If 7PM-7AM, please contact night-coverage.  05/15/2013, 9:40 AM  LOS: 2  days   Brief narrative: Patient is a 33 year old gentleman well known to our service who initially presented to the sickle cell the hospital with pain all over but more localized to his legs and lower back. He was initiated on IV Dilaudid PCA and Toradol as well as supportive care. He continued to have more pain rated as 8/10 not getting any better. Also having severe itching the pain was not relieved by medication and was exacerbated by any movement. He has some shortness of breath but denies cough. He was recently admitted into the hospital which time he had acute chest syndrome. He has apparently been sick for 2 weeks but decided not to come in because he was waiting for his birthday which was this past weekend. He planned and as executed a party at the time. He has been drinking excessively recently including this past weekend where he drank vodka. Oral these was apparently have made him have more pain. He's been cc taken all his medications at home although objective findings have shown he is not taking his hydroxyurea. Patient was evaluated now and had oxygen saturations between 80-90% on room air, and has chest x-ray indicated acute chest syndrome hence he is being admitted to the hospital.   Consultants:  None  Procedures:  None  Antibiotics:  Cefipime 4/13>>  Vancomycin 4/1>>  HPI/Subjective: Pt reports that pain localized to back and ribs only. He rates the pain at an intensity of 6/10 and states that it is dull in nature which is an improvement from the throbbing pain that he  had yesterday. Overall he feels better.   We have discussed his ETOH use and pt states that he will cut back on drinking. He drinks only on the weekends and has never had withdrawal. He also states that he has been exposed to more 2nd had smoke lately. However he does not smoke cigarettes. He does smoke Marijuana about 2 x week. Pt had a BM yesterday.  Objective: Filed Vitals:   05/15/13 0524 05/15/13 0530  05/15/13 0800 05/15/13 0904  BP:  123/90 148/90   Pulse:      Temp:      TempSrc:      Resp: 20  18 12   Height:      Weight:      SpO2:   95% 96%   Weight change:   Intake/Output Summary (Last 24 hours) at 05/15/13 0940 Last data filed at 05/15/13 0802  Gross per 24 hour  Intake 1621.95 ml  Output   2025 ml  Net -403.05 ml    General: Alert, awake, oriented x3, in no acute distress. Much more energetic today.  HEENT: [place]/AT PEERL, EOMI, anicteric OROPHARYNX:  Moist, No exudate/ erythema/lesions.  Heart: Regular rate and rhythm, without murmurs, rubs, gallops.  Lungs: mild bi-basilar crackles, no wheezing or rhonchi noted.  Abdomen: Soft, nontender, nondistended, positive bowel sounds, no masses no hepatosplenomegaly noted. Neuro: No focal neurological deficits noted cranial nerves II through XII grossly intact.  Strength normal in bilateral upper and lower extremities. Musculoskeletal: No warm swelling or erythema around joints, no spinal tenderness noted. Psychiatric: Patient alert and oriented x3, good insight and cognition, good recent to remote recall.   Data Reviewed: Basic Metabolic Panel:  Recent Labs Lab 05/10/13 0349 05/13/13 0900 05/13/13 2035 05/14/13 0545 05/15/13 0500  NA 141 139  --  138 137  K 3.4* 3.3*  --  3.6* 4.1  CL 106 104  --  104 103  CO2 21 22  --  22 21  GLUCOSE 122* 100*  --  103* 95  BUN 6 5*  --  5* 7  CREATININE 0.63 0.56 0.57 0.54 0.57  CALCIUM 8.8 9.1  --  8.8 8.8   Liver Function Tests:  Recent Labs Lab 05/10/13 0349 05/13/13 0900 05/14/13 0545 05/15/13 0500  AST 30 37 32 35  ALT 17 19 17 19   ALKPHOS 90 95 83 100  BILITOT 5.2* 5.9* 5.2* 4.5*  PROT 7.5 7.6 7.2 7.3  ALBUMIN 4.0 4.1 4.0 4.2   No results found for this basename: LIPASE, AMYLASE,  in the last 168 hours No results found for this basename: AMMONIA,  in the last 168 hours CBC:  Recent Labs Lab 05/10/13 0349 05/13/13 0900 05/13/13 2035 05/14/13 0545  05/15/13 0500  WBC 18.5* 19.9* 21.6* 23.6* 20.2*  NEUTROABS 13.0* 14.7*  --  16.5* 13.0*  HGB 7.9* 8.3* 8.0* 7.9* 7.9*  HCT 23.3* 24.7* 22.8* 23.3* 22.6*  MCV 94.0 93.9 91.6 93.6 90.0  PLT 452* 473* 477* 464* 526*   Cardiac Enzymes: No results found for this basename: CKTOTAL, CKMB, CKMBINDEX, TROPONINI,  in the last 168 hours BNP (last 3 results)  Recent Labs  11/13/12 1826  PROBNP 106.3   CBG: No results found for this basename: GLUCAP,  in the last 168 hours  Recent Results (from the past 240 hour(s))  MRSA PCR SCREENING     Status: Abnormal   Collection Time    05/13/13  6:00 PM      Result Value Ref Range  Status   MRSA by PCR POSITIVE (*) NEGATIVE Final   Comment:            The GeneXpert MRSA Assay (FDA     approved for NASAL specimens     only), is one component of a     comprehensive MRSA colonization     surveillance program. It is not     intended to diagnose MRSA     infection nor to guide or     monitor treatment for     MRSA infections.     RESULT CALLED TO, READ BACK BY AND VERIFIED WITH:     C.DENNY,RN AT 2320 ON 05/13/13 BY W.SHEA  CULTURE, BLOOD (ROUTINE X 2)     Status: None   Collection Time    05/13/13  8:35 PM      Result Value Ref Range Status   Specimen Description BLOOD RIGHT ARM   Final   Special Requests BOTTLES DRAWN AEROBIC AND ANAEROBIC 4ML   Final   Culture  Setup Time     Final   Value: 05/14/2013 01:08     Performed at [facility]   Culture     Final   Value:        BLOOD CULTURE RECEIVED NO GROWTH TO DATE CULTURE WILL BE HELD FOR 5 DAYS BEFORE ISSUING A FINAL NEGATIVE REPORT     Performed at [facility]   Report Status PENDING   Incomplete  CULTURE, BLOOD (ROUTINE X 2)     Status: None   Collection Time    05/13/13  8:40 PM      Result Value Ref Range Status   Specimen Description BLOOD RIGHT HAND   Final   Special Requests BOTTLES DRAWN AEROBIC AND ANAEROBIC 4ML   Final   Culture  Setup Time     Final    Value: 05/14/2013 01:08     Performed at [facility]   Culture     Final   Value:        BLOOD CULTURE RECEIVED NO GROWTH TO DATE CULTURE WILL BE HELD FOR 5 DAYS BEFORE ISSUING A FINAL NEGATIVE REPORT     Performed at [facility]   Report Status PENDING   Incomplete     Studies: Dg Chest 2 View  05/13/2013   CLINICAL DATA:  Weakness, fatigue and body aches. Sickle cell disease.  EXAM: CHEST  2 VIEW  COMPARISON:  Chest x-ray 04/09/2013.  FINDINGS: Mild diffuse interstitial prominence and patchy airspace opacities, most notably in the right lower lobe, concerning for acute chest syndrome in this patient history of sickle cell disease. No pleural effusion. No definite evidence of pulmonary edema. Heart size remains mildly enlarged. Upper mediastinal contours are within normal limits. Left subclavian single-lumen power porta cath with tip terminating at the superior cavoatrial junction. Surgical clips project over the right upper quadrant of the abdomen, compatible with prior cholecystectomy.  IMPRESSION: 1. Interval development of interstitial and patchy airspace opacities concerning for acute chest syndrome in this patient with history of sickle cell disease. Clinical correlation is recommended. 2. Mild cardiomegaly (unchanged).   Electronically Signed   By: Vinnie Langton M.D.   On: 05/13/2013 16:46    Scheduled Meds: . ceFEPime (MAXIPIME) IV  1 g Intravenous 3 times per day  . Chlorhexidine Gluconate Cloth  6 each Topical Q0600  . folic acid  1 mg Oral Daily  . HYDROmorphone PCA 0.3 mg/mL   Intravenous  6 times per day  . hydroxyurea  500 mg Oral BID  . ketorolac  15 mg Intravenous 4 times per day  . morphine  15 mg Oral BID  . mupirocin ointment  1 application Nasal BID  . polyethylene glycol  17 g Oral Daily  . potassium chloride SA  20 mEq Oral q morning - 10a  . rivaroxaban  20 mg Oral Q supper  . senna  1 tablet Oral q12n4p  . vancomycin  1,250 mg Intravenous 3 times  per day  . [START ON 05/19/2013] Vitamin D (Ergocalciferol)  50,000 Units Oral Weekly   Continuous Infusions: . 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

## 2013-05-16 DIAGNOSIS — E860 Dehydration: Secondary | ICD-10-CM | POA: Diagnosis not present

## 2013-05-16 DIAGNOSIS — R0789 Other chest pain: Secondary | ICD-10-CM

## 2013-05-16 DIAGNOSIS — D5700 Hb-SS disease with crisis, unspecified: Secondary | ICD-10-CM | POA: Diagnosis not present

## 2013-05-16 DIAGNOSIS — Z7901 Long term (current) use of anticoagulants: Secondary | ICD-10-CM

## 2013-05-16 LAB — CBC WITH DIFFERENTIAL/PLATELET
BASOS PCT: 1 % (ref 0–1)
Basophils Absolute: 0.2 10*3/uL — ABNORMAL HIGH (ref 0.0–0.1)
Eosinophils Absolute: 1 10*3/uL — ABNORMAL HIGH (ref 0.0–0.7)
Eosinophils Relative: 5 % (ref 0–5)
HCT: 23.3 % — ABNORMAL LOW (ref 39.0–52.0)
HEMOGLOBIN: 8.3 g/dL — AB (ref 13.0–17.0)
LYMPHS PCT: 15 % (ref 12–46)
Lymphs Abs: 3.1 10*3/uL (ref 0.7–4.0)
MCH: 30.5 pg (ref 26.0–34.0)
MCHC: 35.6 g/dL (ref 30.0–36.0)
MCV: 85.7 fL (ref 78.0–100.0)
MONO ABS: 2.7 10*3/uL — AB (ref 0.1–1.0)
Monocytes Relative: 13 % — ABNORMAL HIGH (ref 3–12)
NEUTROS PCT: 66 % (ref 43–77)
Neutro Abs: 13.7 10*3/uL — ABNORMAL HIGH (ref 1.7–7.7)
PLATELETS: 474 10*3/uL — AB (ref 150–400)
RBC: 2.72 MIL/uL — AB (ref 4.22–5.81)
RDW: 20.7 % — ABNORMAL HIGH (ref 11.5–15.5)
WBC: 20.7 10*3/uL — ABNORMAL HIGH (ref 4.0–10.5)

## 2013-05-16 LAB — COMPREHENSIVE METABOLIC PANEL
ALBUMIN: 4.4 g/dL (ref 3.5–5.2)
ALT: 24 U/L (ref 0–53)
AST: 47 U/L — AB (ref 0–37)
Alkaline Phosphatase: 105 U/L (ref 39–117)
BUN: 8 mg/dL (ref 6–23)
CALCIUM: 9.1 mg/dL (ref 8.4–10.5)
CO2: 21 meq/L (ref 19–32)
Chloride: 103 mEq/L (ref 96–112)
Creatinine, Ser: 0.56 mg/dL (ref 0.50–1.35)
GFR calc Af Amer: >90 mL/min (ref >90)
GFR calc non Af Amer: >90 mL/min (ref >90)
Glucose, Bld: 112 mg/dL — ABNORMAL HIGH (ref 70–99)
Potassium: 4.4 mEq/L (ref 3.7–5.3)
SODIUM: 137 meq/L (ref 137–147)
Total Bilirubin: 5 mg/dL — ABNORMAL HIGH (ref 0.3–1.2)
Total Protein: 7.8 g/dL (ref 6.0–8.3)

## 2013-05-16 MED ORDER — HYDROMORPHONE 0.3 MG/ML IV SOLN
INTRAVENOUS | Status: DC
  Administered 2013-05-16: 16:00:00 via INTRAVENOUS
  Administered 2013-05-16: 20.71 mg via INTRAVENOUS
  Administered 2013-05-16: 20:00:00 via INTRAVENOUS
  Administered 2013-05-16: 2.99 mg via INTRAVENOUS
  Administered 2013-05-16 – 2013-05-17 (×4): via INTRAVENOUS
  Administered 2013-05-17: 17.71 mg via INTRAVENOUS
  Filled 2013-05-16 (×7): qty 25

## 2013-05-16 NOTE — Progress Notes (Signed)
Subjective: A 33 yo man admitted with sickle cell painful crisis and acute chest syndrome due to pneumonia. He has improved but still has pain at 9/10. Pain affects all limbs but especially his rib cage. Worsened with breathing and movement. It is the same pain he had since admission. Partially relieved by the Dilaudid but it comes back quickly.  Objective: Vital signs in last 24 hours: Temp:  [98.1 F (36.7 C)-99.1 F (37.3 C)] 98.1 F (36.7 C) (04/16 0938) Pulse Rate:  [72-112] 72 (04/16 0938) Resp:  [15-22] 16 (04/16 0938) BP: (112-148)/(84-97) 123/84 mmHg (04/16 0938) SpO2:  [91 %-97 %] 97 % (04/16 0938) Weight change:  Last BM Date: 05/15/13  Intake/Output from previous day: 04/15 0701 - 04/16 0700 In: 2828.1 [P.O.:1040; I.V.:815.6; Blood:337.5; IV Piggyback:300] Out: 2325 [Urine:2325] Intake/Output this shift: Total I/O In: 240 [P.O.:240] Out: 150 [Urine:150]  General appearance: alert, cooperative and no distress Eyes: conjunctivae/corneas clear. PERRL, EOM's intact. Fundi benign. Back: symmetric, no curvature. ROM normal. No CVA tenderness. Resp: clear to auscultation bilaterally Chest wall: no tenderness Cardio: regular rate and rhythm, S1, S2 normal, no murmur, click, rub or gallop GI: soft, non-tender; bowel sounds normal; no masses,  no organomegaly Extremities: extremities normal, atraumatic, no cyanosis or edema Pulses: 2+ and symmetric Skin: Skin color, texture, turgor normal. No rashes or lesions Neurologic: Grossly normal  Lab Results:  Recent Labs  05/15/13 0500 05/16/13 0540  WBC 20.2* 20.7*  HGB 7.9* 8.3*  HCT 22.6* 23.3*  PLT 526* 474*   BMET  Recent Labs  05/15/13 0500 05/16/13 0540  NA 137 137  K 4.1 4.4  CL 103 103  CO2 21 21  GLUCOSE 95 112*  BUN 7 8  CREATININE 0.57 0.56  CALCIUM 8.8 9.1    Studies/Results: Dg Chest 2 View  05/14/2013   CLINICAL DATA:  Acute chest syndrome.  EXAM: CHEST  2 VIEW  COMPARISON:  05/13/2013   FINDINGS: Heart is enlarged. Patient also sided power port, tip overlying the level of the lower superior vena cava. There mild perihilar changes of pulmonary edema. No focal consolidations or pleural effusions are identified.  IMPRESSION: Cardiomegaly and mild pulmonary edema.   Electronically Signed   By: Shon Hale M.D.   On: 05/14/2013 15:28    Medications: I have reviewed the patient's current medications.  Assessment/Plan: A 33 yo man admitted with acute chest syndrome and Sickle cell Painful crisis.  #1 Sickle Cell Painful Crisis: Patient still insevere pain 9/10 despite being on his dilaudid PCA. I will add 0.25 mg basal dose for the next 24 hours and change his delivery dose to 1 mg with 8 minutes lockout. We can begin titrating it down tomorrow.  #2 Pneumonia: Treated. Off Oxygen now.  #3 Sickle Cell Anemia: H/H stable   #4 Chronic Anti coagulation: Continue Xarelto  #5 Mood Disorder: Continue home medications.  LOS: 3 days   Elwyn Reach 05/16/2013, 11:55 AM

## 2013-05-16 NOTE — Progress Notes (Signed)
MD, please look at patient PCA orders.   Under orders instead of 4 hour lockout being 20 mg, order shows 1 hour lockout being 20 mg.  Spoke with night shift Hospitalist who agreed order is 20mg Carlena Hurl. Could you please clarify this order.Genasis Zingale [place]

## 2013-05-17 DIAGNOSIS — M8700 Idiopathic aseptic necrosis of unspecified bone: Secondary | ICD-10-CM

## 2013-05-17 DIAGNOSIS — E559 Vitamin D deficiency, unspecified: Secondary | ICD-10-CM

## 2013-05-17 DIAGNOSIS — Z7289 Other problems related to lifestyle: Secondary | ICD-10-CM | POA: Diagnosis not present

## 2013-05-17 DIAGNOSIS — R17 Unspecified jaundice: Secondary | ICD-10-CM

## 2013-05-17 DIAGNOSIS — D5700 Hb-SS disease with crisis, unspecified: Secondary | ICD-10-CM | POA: Diagnosis not present

## 2013-05-17 DIAGNOSIS — F1210 Cannabis abuse, uncomplicated: Secondary | ICD-10-CM

## 2013-05-17 DIAGNOSIS — R0789 Other chest pain: Secondary | ICD-10-CM | POA: Diagnosis not present

## 2013-05-17 LAB — CBC WITH DIFFERENTIAL/PLATELET
BASOS PCT: 0 % (ref 0–1)
Basophils Absolute: 0 10*3/uL (ref 0.0–0.1)
EOS ABS: 0.2 10*3/uL (ref 0.0–0.7)
Eosinophils Relative: 1 % (ref 0–5)
HCT: 20.1 % — ABNORMAL LOW (ref 39.0–52.0)
Hemoglobin: 7.5 g/dL — ABNORMAL LOW (ref 13.0–17.0)
LYMPHS ABS: 1.7 10*3/uL (ref 0.7–4.0)
Lymphocytes Relative: 8 % — ABNORMAL LOW (ref 12–46)
MCH: 30.9 pg (ref 26.0–34.0)
MCHC: 37.3 g/dL — AB (ref 30.0–36.0)
MCV: 82.7 fL (ref 78.0–100.0)
MONO ABS: 2.9 10*3/uL — AB (ref 0.1–1.0)
Monocytes Relative: 14 % — ABNORMAL HIGH (ref 3–12)
Neutro Abs: 15.9 10*3/uL — ABNORMAL HIGH (ref 1.7–7.7)
Neutrophils Relative %: 77 % (ref 43–77)
Platelets: 482 10*3/uL — ABNORMAL HIGH (ref 150–400)
RBC: 2.43 MIL/uL — ABNORMAL LOW (ref 4.22–5.81)
RDW: 20.3 % — ABNORMAL HIGH (ref 11.5–15.5)
WBC: 20.7 10*3/uL — ABNORMAL HIGH (ref 4.0–10.5)

## 2013-05-17 LAB — TYPE AND SCREEN
ABO/RH(D): O POS
ANTIBODY SCREEN: NEGATIVE
UNIT DIVISION: 0

## 2013-05-17 LAB — COMPREHENSIVE METABOLIC PANEL
ALT: 23 U/L (ref 0–53)
AST: 50 U/L — ABNORMAL HIGH (ref 0–37)
Albumin: 4.3 g/dL (ref 3.5–5.2)
Alkaline Phosphatase: 90 U/L (ref 39–117)
BUN: 13 mg/dL (ref 6–23)
CALCIUM: 9.2 mg/dL (ref 8.4–10.5)
CO2: 22 mEq/L (ref 19–32)
CREATININE: 0.66 mg/dL (ref 0.50–1.35)
Chloride: 103 mEq/L (ref 96–112)
GLUCOSE: 130 mg/dL — AB (ref 70–99)
Potassium: 4.8 mEq/L (ref 3.7–5.3)
SODIUM: 137 meq/L (ref 137–147)
Total Bilirubin: 6.8 mg/dL — ABNORMAL HIGH (ref 0.3–1.2)
Total Protein: 7.7 g/dL (ref 6.0–8.3)

## 2013-05-17 LAB — HEMOGLOBINOPATHY EVALUATION
HEMOGLOBIN OTHER: 0 %
HGB A: 14.2 % — AB (ref 96.8–97.8)
HGB S QUANTITAION: 77 % — AB
Hgb A2 Quant: 3.2 % (ref 2.2–3.2)
Hgb F Quant: 5.6 % — ABNORMAL HIGH (ref 0.0–2.0)

## 2013-05-17 MED ORDER — HYDROMORPHONE HCL PF 1 MG/ML IJ SOLN
1.0000 mg | INTRAMUSCULAR | Status: DC | PRN
  Administered 2013-05-17 – 2013-05-18 (×6): 1 mg via INTRAVENOUS
  Filled 2013-05-17 (×6): qty 1

## 2013-05-17 MED ORDER — HYDROMORPHONE HCL 4 MG PO TABS
4.0000 mg | ORAL_TABLET | ORAL | Status: DC | PRN
  Administered 2013-05-17: 4 mg via ORAL
  Filled 2013-05-17: qty 1

## 2013-05-17 MED ORDER — HYDROMORPHONE 0.3 MG/ML IV SOLN
INTRAVENOUS | Status: DC
  Administered 2013-05-17: 9.33 mg via INTRAVENOUS
  Administered 2013-05-17: 14.88 mg via INTRAVENOUS
  Administered 2013-05-17 (×2): via INTRAVENOUS
  Administered 2013-05-17: 7.56 mg via INTRAVENOUS
  Administered 2013-05-18: 6.39 mg via INTRAVENOUS
  Administered 2013-05-18: 02:00:00 via INTRAVENOUS
  Administered 2013-05-18: 8.29 mg via INTRAVENOUS
  Administered 2013-05-18: 08:00:00 via INTRAVENOUS
  Filled 2013-05-17 (×6): qty 25

## 2013-05-17 MED ORDER — HYDROMORPHONE HCL 4 MG PO TABS
4.0000 mg | ORAL_TABLET | ORAL | Status: DC | PRN
  Administered 2013-05-17 (×2): 4 mg via ORAL
  Filled 2013-05-17 (×2): qty 1

## 2013-05-17 NOTE — Progress Notes (Signed)
Pt pacing in room, rocking back and forth. Reports 10/10 "sharp, constant" chest pain despite Dilaudid PCA and PO Dilaudid dosing. Dr Wynetta Emery aware. IV breakthrough dose of Dilaudid ordered and given. Will monitor closely.

## 2013-05-17 NOTE — Progress Notes (Signed)
RN reporting patient complaining pain 10/10 despite PCA and oral pain meds, will add clinician assisted dosing dilaudid 1 mg IV Q 3 hours prn breakthru pain, hold for sedation and hold for RR < 14.   Murvin Natal, MD

## 2013-05-17 NOTE — Progress Notes (Addendum)
PHYSICIAN PROGRESS NOTE  [name] WUJ:[id number] DOB: 1980/07/22 DOA: 05/13/2013  05/17/2013   PCP: [name],[name], MD  Admission History: Patient is a 33 year old gentleman well known to sickle cell service who presented to the sickle cell day hospital with pain all over but more localized to his legs and lower back. He was initiated on IV Dilaudid PCA and Toradol as well as supportive care. He continued to have more pain rated as 8/10 not getting any better. Also having severe itching the pain was not relieved by medication and was exacerbated by any movement. He has some shortness of breath but denies cough. He was recently admitted into the hospital which time he had acute chest syndrome. He has apparently been sick for 2 weeks but decided not to come in because he was waiting for his birthday which was this past weekend. He planned and as executed a party at the time. He has been drinking excessively recently including this past weekend where he drank vodka. Overall these apparently have made him have more pain. He's been taking all his medications at home although objective findings have shown he is not taking his hydroxyurea. Patient was evaluated now and had oxygen saturations between 80-90% on room air, and has chest x-ray indicated acute chest syndrome hence he is being admitted to the hospital.  Assessment/Plan: 1. Acute Chest Syndrome: Pt has had 2nd episode of acute chest this year. He is slowly clinically improving.  Pain is better controlled now.  Continue oral levaquin to complete an 8 day course of treatment. Pt has been weaned to room air and oxygen saturation has been 90%.  Encouraged incentive spirometry.   2. Hb SS with Crisis: Pt is reporting better pain control today.  He has used 53 mg dilaudid with 58 demands, 53 deliveries in last 24 hours.  Will continue PCA but reduce dosing,  discontinue basal dose of dilaudid, add home oral dilaudid today (hold for sedation),  continueToradol and MS Contin.  3. Leukocytosis: Likely secondary to crisis. Will continue to monitor. 4. ETOH consumption: Pt has been drinking ETOH on weekends. He has exhibited no signs of withdrawal however willl continue to observe closely. This has been addressed with patient and will be further addressed outpatient with PCP.  He is willing to cut down on drinking now that he is back with his girlfriend. 5. 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 No evidence of active bleeding. 6. Cardiomegaly: this is a chronic finding, given cxr findings of pulmonary edema and further cardiomegaly will check ECHO to further assess pump function in the setting of more frequent admissions and poor self care.  Last ECHO from 07/2012 was reviewed.   7. Chronic Opiate dependence: Pt opiate tolerant and on MS Contin for treatment of chronic pain. 8. Sickle Cell Disease: Continue Hydrea and Folic Acid. 9. Chronic Hypokalemia - repleted and following. Continue supplementation.  10. 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 He has no particular reason. He is aware of the risk of Iron overload to his organs up to and including death.  Code Status: Full Code  Family Communication: counseled patient at bedside  Disposition Plan:  Anticipate discharge in next 24-48 hours  HPI/Subjective: Pt reports that pain in ribs, legs and chest has been better controlled after he had experienced an exacerbation yesterday.  His pain is reported as 7/10.  He is having regular bowel movements.  His nausea has improved.  He has been ambulating in the room.  He denies shortness of breath.  He says that the cramping in his stomach has resolved now.  He reports that he is eating regularly and drinking regularly.    Objective: Filed Vitals:   05/17/13 0625  BP:   Pulse:   Temp:   Resp: 16    Intake/Output Summary (Last 24 hours) at 05/17/13 0808 Last data filed at 05/17/13 4580  Gross per 24 hour  Intake  2049.67 ml  Output   1851 ml  Net 198.67 ml   Filed Weights   05/13/13 0835 05/13/13 1724  Weight: 172 lb (78.019 kg) 169 lb 12.1 oz (77 kg)   Review Of Systems: As detailed above, otherwise all reviewed and negative  Exam:  General: chronically ill appearing, poorly groomed, alert, awake, oriented x3, in no acute distress. HEENT: [place]/AT PEERL, EOMI, bilateral scleral icterus  OROPHARYNX: Pale mucosa. Moist, No exudate/ erythema/lesions.  Heart: Regular rate and rhythm, without murmurs, rubs, gallops.  Lungs: bilateral BS clear, no wheezing or rhonchi noted.  Abdomen: Soft, nontender, nondistended, positive bowel sounds, no masses no hepatosplenomegaly noted.  Neuro: No focal neurological deficits noted cranial nerves II through XII grossly intact. Strength normal in bilateral upper and lower extremities.  Musculoskeletal: No warm swelling or erythema around joints, no spinal tenderness noted.  Psychiatric: Patient alert and oriented x3, good insight and cognition, good recent to remote recall.  Data Reviewed: Basic Metabolic Panel:  Recent Labs Lab 05/13/13 0900 05/13/13 2035 05/14/13 0545 05/15/13 0500 05/16/13 0540 05/17/13 0545  NA 139  --  138 137 137 137  K 3.3*  --  3.6* 4.1 4.4 4.8  CL 104  --  104 103 103 103  CO2 22  --  22 21 21 22   GLUCOSE 100*  --  103* 95 112* 130*  BUN 5*  --  5* 7 8 13   CREATININE 0.56 0.57 0.54 0.57 0.56 0.66  CALCIUM 9.1  --  8.8 8.8 9.1 9.2   Liver Function Tests:  Recent Labs Lab 05/13/13 0900 05/14/13 0545 05/15/13 0500 05/16/13 0540 05/17/13 0545  AST 37 32 35 47* 50*  ALT 19 17 19 24 23   ALKPHOS 95 83 100 105 90  BILITOT 5.9* 5.2* 4.5* 5.0* 6.8*  PROT 7.6 7.2 7.3 7.8 7.7  ALBUMIN 4.1 4.0 4.2 4.4 4.3   No results found for this basename: LIPASE, AMYLASE,  in the last 168 hours No results found for this basename: AMMONIA,  in the last 168 hours CBC:  Recent Labs Lab 05/13/13 0900 05/13/13 2035 05/14/13 0545  05/15/13 0500 05/16/13 0540 05/17/13 0545  WBC 19.9* 21.6* 23.6* 20.2* 20.7* 20.7*  NEUTROABS 14.7*  --  16.5* 13.0* 13.7* 15.9*  HGB 8.3* 8.0* 7.9* 7.9* 8.3* 7.5*  HCT 24.7* 22.8* 23.3* 22.6* 23.3* 20.1*  MCV 93.9 91.6 93.6 90.0 85.7 82.7  PLT 473* 477* 464* 526* 474* 482*   Cardiac Enzymes: No results found for this basename: CKTOTAL, CKMB, CKMBINDEX, TROPONINI,  in the last 168 hours BNP (last 3 results)  Recent Labs  11/13/12 1826  PROBNP 106.3   CBG: No results found for this basename: GLUCAP,  in the last 168 hours  Recent Results (from the past 240 hour(s))  MRSA PCR SCREENING     Status: Abnormal   Collection Time    05/13/13  6:00 PM      Result Value Ref Range Status   MRSA by PCR POSITIVE (*) NEGATIVE Final   Comment:  The GeneXpert MRSA Assay (FDA     approved for NASAL specimens     only), is one component of a     comprehensive MRSA colonization     surveillance program. It is not     intended to diagnose MRSA     infection nor to guide or     monitor treatment for     MRSA infections.     RESULT CALLED TO, READ BACK BY AND VERIFIED WITH:     C.DENNY,RN AT 2320 ON 05/13/13 BY W.SHEA  CULTURE, BLOOD (ROUTINE X 2)     Status: None   Collection Time    05/13/13  8:35 PM      Result Value Ref Range Status   Specimen Description BLOOD RIGHT ARM   Final   Special Requests BOTTLES DRAWN AEROBIC AND ANAEROBIC 4ML   Final   Culture  Setup Time     Final   Value: 05/14/2013 01:08     Performed at [facility]   Culture     Final   Value:        BLOOD CULTURE RECEIVED NO GROWTH TO DATE CULTURE WILL BE HELD FOR 5 DAYS BEFORE ISSUING A FINAL NEGATIVE REPORT     Performed at [facility]   Report Status PENDING   Incomplete  CULTURE, BLOOD (ROUTINE X 2)     Status: None   Collection Time    05/13/13  8:40 PM      Result Value Ref Range Status   Specimen Description BLOOD RIGHT HAND   Final   Special Requests BOTTLES DRAWN AEROBIC AND  ANAEROBIC 4ML   Final   Culture  Setup Time     Final   Value: 05/14/2013 01:08     Performed at [facility]   Culture     Final   Value:        BLOOD CULTURE RECEIVED NO GROWTH TO DATE CULTURE WILL BE HELD FOR 5 DAYS BEFORE ISSUING A FINAL NEGATIVE REPORT     Performed at [facility]   Report Status PENDING   Incomplete     Studies: No results found.  Scheduled Meds: . Chlorhexidine Gluconate Cloth  6 each Topical Q0600  . folic acid  1 mg Oral Daily  . HYDROmorphone PCA 0.3 mg/mL   Intravenous 6 times per day  . hydroxyurea  1,000 mg Oral Daily  . ketorolac  15 mg Intravenous 4 times per day  . levofloxacin  750 mg Oral Daily  . morphine  15 mg Oral BID  . mupirocin ointment  1 application Nasal BID  . polyethylene glycol  17 g Oral Daily  . potassium chloride SA  20 mEq Oral q morning - 10a  . rivaroxaban  20 mg Oral Q supper  . senna  1 tablet Oral q12n4p  . [START ON 05/19/2013] Vitamin D (Ergocalciferol)  50,000 Units Oral Weekly   Continuous Infusions: . 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  If 7PM-7AM, please contact night-coverage at [email or web address], password [facility] 05/17/2013, 8:08 AM  LOS: 4 days

## 2013-05-18 ENCOUNTER — Inpatient Hospital Stay (HOSPITAL_COMMUNITY): Payer: Medicare Other

## 2013-05-18 DIAGNOSIS — J189 Pneumonia, unspecified organism: Secondary | ICD-10-CM | POA: Diagnosis not present

## 2013-05-18 DIAGNOSIS — Z86711 Personal history of pulmonary embolism: Secondary | ICD-10-CM | POA: Diagnosis not present

## 2013-05-18 DIAGNOSIS — D473 Essential (hemorrhagic) thrombocythemia: Secondary | ICD-10-CM

## 2013-05-18 DIAGNOSIS — R0789 Other chest pain: Secondary | ICD-10-CM | POA: Diagnosis not present

## 2013-05-18 DIAGNOSIS — I2782 Chronic pulmonary embolism: Secondary | ICD-10-CM | POA: Diagnosis not present

## 2013-05-18 DIAGNOSIS — Z7901 Long term (current) use of anticoagulants: Secondary | ICD-10-CM | POA: Diagnosis not present

## 2013-05-18 DIAGNOSIS — M79604 Pain in right leg: Secondary | ICD-10-CM | POA: Diagnosis present

## 2013-05-18 DIAGNOSIS — D5700 Hb-SS disease with crisis, unspecified: Secondary | ICD-10-CM | POA: Diagnosis not present

## 2013-05-18 DIAGNOSIS — E876 Hypokalemia: Secondary | ICD-10-CM

## 2013-05-18 DIAGNOSIS — I10 Essential (primary) hypertension: Secondary | ICD-10-CM

## 2013-05-18 DIAGNOSIS — I369 Nonrheumatic tricuspid valve disorder, unspecified: Secondary | ICD-10-CM

## 2013-05-18 DIAGNOSIS — R079 Chest pain, unspecified: Secondary | ICD-10-CM | POA: Diagnosis not present

## 2013-05-18 DIAGNOSIS — I5030 Unspecified diastolic (congestive) heart failure: Secondary | ICD-10-CM | POA: Diagnosis not present

## 2013-05-18 DIAGNOSIS — M79605 Pain in left leg: Secondary | ICD-10-CM

## 2013-05-18 LAB — COMPREHENSIVE METABOLIC PANEL
ALBUMIN: 4.4 g/dL (ref 3.5–5.2)
ALK PHOS: 99 U/L (ref 39–117)
ALT: 32 U/L (ref 0–53)
AST: 71 U/L — AB (ref 0–37)
BUN: 16 mg/dL (ref 6–23)
CALCIUM: 8.8 mg/dL (ref 8.4–10.5)
CO2: 20 mEq/L (ref 19–32)
Chloride: 106 mEq/L (ref 96–112)
Creatinine, Ser: 0.63 mg/dL (ref 0.50–1.35)
GFR calc non Af Amer: >90 mL/min (ref >90)
Glucose, Bld: 117 mg/dL — ABNORMAL HIGH (ref 70–99)
POTASSIUM: 4.8 meq/L (ref 3.7–5.3)
Sodium: 140 mEq/L (ref 137–147)
Total Bilirubin: 7.6 mg/dL — ABNORMAL HIGH (ref 0.3–1.2)
Total Protein: 8.1 g/dL (ref 6.0–8.3)

## 2013-05-18 LAB — CBC
HCT: 19.8 % — ABNORMAL LOW (ref 39.0–52.0)
Hemoglobin: 7.2 g/dL — ABNORMAL LOW (ref 13.0–17.0)
MCH: 30.1 pg (ref 26.0–34.0)
MCHC: 36.4 g/dL — ABNORMAL HIGH (ref 30.0–36.0)
MCV: 82.8 fL (ref 78.0–100.0)
PLATELETS: 485 10*3/uL — AB (ref 150–400)
RBC: 2.39 MIL/uL — ABNORMAL LOW (ref 4.22–5.81)
RDW: 20.4 % — AB (ref 11.5–15.5)
WBC: 23.8 10*3/uL — ABNORMAL HIGH (ref 4.0–10.5)

## 2013-05-18 LAB — TROPONIN I: Troponin I: <0.3 ng/mL (ref <0.30)

## 2013-05-18 LAB — CK: Total CK: 85 U/L (ref 7–232)

## 2013-05-18 MED ORDER — HYDROMORPHONE 0.3 MG/ML IV SOLN
INTRAVENOUS | Status: DC
  Administered 2013-05-18: 6.94 mg via INTRAVENOUS
  Administered 2013-05-18: 12:00:00 via INTRAVENOUS
  Administered 2013-05-18: 7.47 mg via INTRAVENOUS
  Administered 2013-05-18: 8.16 mg via INTRAVENOUS
  Administered 2013-05-19: 7.19 mg via INTRAVENOUS
  Administered 2013-05-19: 6.8 mg via INTRAVENOUS
  Filled 2013-05-18 (×4): qty 25

## 2013-05-18 MED ORDER — HYDROMORPHONE HCL PF 1 MG/ML IJ SOLN
0.5000 mg | INTRAMUSCULAR | Status: DC | PRN
  Administered 2013-05-18 – 2013-05-19 (×7): 1 mg via INTRAVENOUS
  Filled 2013-05-18 (×8): qty 1

## 2013-05-18 MED ORDER — IOHEXOL 350 MG/ML SOLN
100.0000 mL | Freq: Once | INTRAVENOUS | Status: AC | PRN
  Administered 2013-05-18: 100 mL via INTRAVENOUS

## 2013-05-18 NOTE — Progress Notes (Signed)
Echo Lab  2D Echocardiogram completed.  Baker, RDCS 05/18/2013 9:28 AM

## 2013-05-18 NOTE — Progress Notes (Signed)
PHYSICIAN PROGRESS NOTE  [name] GYJ:[id number] DOB: [birth year] DOA: 05/13/2013  05/18/2013   PCP: [name],[name], MD  Admission History: Patient is a 33 year old gentleman well known to sickle cell service who presented [facility] with pain all over but more localized to his legs and lower back. He was initiated on IV Dilaudid PCA and Toradol as well as supportive care. He continued to have more pain rated as 8/10 not getting any better. Also having severe itching the pain was not relieved by medication and was exacerbated by any movement. He has some shortness of breath but denies cough. He was recently admitted into the hospital which time he had acute chest syndrome. He has apparently been sick for 2 weeks but decided not to come in because he was waiting for his birthday which was this past weekend. He planned and as executed a party at the time. He has been drinking excessively recently including this past weekend where he drank vodka. Overall these apparently have made him have more pain. He's been taking all his medications at home although objective findings have shown he is not taking his hydroxyurea. Patient was evaluated now and had oxygen saturations between 80-90% on room air, and has chest x-ray indicated acute chest syndrome hence he is being admitted to the hospital.  Assessment/Plan: 1. Acute Chest Syndrome: Pt has had 2nd episode of acute chest this year. He is reporting this morning that he had a severe episode of squeezing chest pain yesterday that lasted about an hour.  He says that is was so severe that he rated it 12/10.  Pain is better controlled now with the addition of clinician-assisted dosing. Continue oral levaquin to complete an 8 day course of treatment. Pt has been weaned to room air but now has been restarted on nasal cannula.  Will monitor.  Encouraging incentive spirometry.  Will pursue CT angio today and EKG with a troponin to further  investigate what happened to him yesterday.  The nurse didn't inform me of the details of what happened to him yesterday.  2. Hb SS with Crisis: Pt is reporting better pain control today. He has used 44 mg dilaudid with 64 demands, 57 deliveries in last 24 hours. Will continue PCA and reduced clinician assisted dosing.  Added home oral dilaudid today (hold for sedation), continueToradol and long-acting MS Contin.  3. Leukocytosis: Likely secondary to crisis. Will continue to monitor. 4. ETOH consumption: Pt has been drinking ETOH on weekends. He has exhibited no signs of withdrawal however willl continue to observe closely. This has been addressed with patient and will be further addressed outpatient with PCP. He is willing to cut down on drinking now that he is back with his girlfriend. 5. 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 No evidence of active bleeding. With episode yesterday, I am concerned about recurrence (pt has had some noncompliance with taking medications at home) will check CT angio of chest to further clarify what is happening with him.   6. Cardiomegaly: this is a chronic finding, given cxr findings of pulmonary edema and further cardiomegaly will check ECHO to further assess pump function in the setting of more frequent admissions and poor self care. Last ECHO from 07/2012 was reviewed.  7. Chronic Opiate dependence: Pt opiate tolerant and on MS Contin for treatment of chronic pain. 8. Sickle Cell Disease: Continue Hydrea and Folic Acid. 9. Chronic Hypokalemia - repleted and following. Continue supplementation.  10. Hemochromatosis: Pt  has chosen to stop taking Exjade. He has no particular reason. He is aware of the risk of Iron overload to his organs up to and including death.  Code Status: Full Code  Family Communication: counseled patient at bedside   HPI/Subjective: Pt reports that he is having severe lower back pain and leg pain this morning.  He says that he had an  episode of severe squeezing chest pain yesterday while in the bathroom that was 12/10 and reports that it lasted for over an hour then it improved.  He says that he was afraid during the episode and had shortness of breath.  He says that he informed the nurse but the nurse didn't inform me of the details of what was happening to him.  I was sent a text page that patient's pain wasn't controlled and he was rocking on the bed complaining of 10/10 pain.  This morning the patient says that his pain is better controlled with clinician-assisted dosing.  He appears lethargic and drifting in and out of sleepiness.  He denies that he is short of breath presently and denies present chest pain.    Objective: Filed Vitals:   05/18/13 0742  BP:   Pulse:   Temp:   Resp: 22    Intake/Output Summary (Last 24 hours) at 05/18/13 0757 Last data filed at 05/18/13 0600  Gross per 24 hour  Intake 2532.37 ml  Output   1650 ml  Net 882.37 ml   Filed Weights   05/13/13 0835 05/13/13 1724  Weight: 172 lb (78.019 kg) 169 lb 12.1 oz (77 kg)    Review Of Systems: As detailed above, otherwise all reviewed and negative  Exam: General: chronically ill appearing, lethargic but easily arousable, oriented x3, in no acute distress.  HEENT: [place]/AT PEERL, EOMI, bilateral scleral icterus  OROPHARYNX: Pale mucosa. Moist, No exudate/ erythema/lesions.  Heart: Regular rate and rhythm, without murmurs, rubs, gallops.  Lungs: bilateral BS clear, no wheezing or rhonchi noted.  Abdomen: Soft, nontender, nondistended, positive bowel sounds, no masses no hepatosplenomegaly noted.  Neuro: No focal neurological deficits noted cranial nerves II through XII grossly intact. Strength normal in bilateral upper and lower extremities.  Musculoskeletal: No warmth, swelling or erythema around joints, lower back tenderness to palpation noted.  Psychiatric: Patient alert and oriented x3, good insight and cognition, good recent to remote  recall.  Data Reviewed: Basic Metabolic Panel:  Recent Labs Lab 05/14/13 0545 05/15/13 0500 05/16/13 0540 05/17/13 0545 05/18/13 0615  NA 138 137 137 137 140  K 3.6* 4.1 4.4 4.8 4.8  CL 104 103 103 103 106  CO2 22 21 21 22 20   GLUCOSE 103* 95 112* 130* 117*  BUN 5* 7 8 13 16   CREATININE 0.54 0.57 0.56 0.66 0.63  CALCIUM 8.8 8.8 9.1 9.2 8.8   Liver Function Tests:  Recent Labs Lab 05/14/13 0545 05/15/13 0500 05/16/13 0540 05/17/13 0545 05/18/13 0615  AST 32 35 47* 50* 71*  ALT 17 19 24 23  32  ALKPHOS 83 100 105 90 99  BILITOT 5.2* 4.5* 5.0* 6.8* 7.6*  PROT 7.2 7.3 7.8 7.7 8.1  ALBUMIN 4.0 4.2 4.4 4.3 4.4   No results found for this basename: LIPASE, AMYLASE,  in the last 168 hours No results found for this basename: AMMONIA,  in the last 168 hours CBC:  Recent Labs Lab 05/13/13 0900  05/14/13 0545 05/15/13 0500 05/16/13 0540 05/17/13 0545 05/18/13 0615  WBC 19.9*  < > 23.6* 20.2* 20.7*  20.7* 23.8*  NEUTROABS 14.7*  --  16.5* 13.0* 13.7* 15.9*  --   HGB 8.3*  < > 7.9* 7.9* 8.3* 7.5* 7.2*  HCT 24.7*  < > 23.3* 22.6* 23.3* 20.1* 19.8*  MCV 93.9  < > 93.6 90.0 85.7 82.7 82.8  PLT 473*  < > 464* 526* 474* 482* 485*  < > = values in this interval not displayed. Cardiac Enzymes: No results found for this basename: CKTOTAL, CKMB, CKMBINDEX, TROPONINI,  in the last 168 hours BNP (last 3 results)  Recent Labs  11/13/12 1826  PROBNP 106.3   CBG: No results found for this basename: GLUCAP,  in the last 168 hours  Recent Results (from the past 240 hour(s))  MRSA PCR SCREENING     Status: Abnormal   Collection Time    05/13/13  6:00 PM      Result Value Ref Range Status   MRSA by PCR POSITIVE (*) NEGATIVE Final   Comment:            The GeneXpert MRSA Assay (FDA     approved for NASAL specimens     only), is one component of a     comprehensive MRSA colonization     surveillance program. It is not     intended to diagnose MRSA     infection nor to  guide or     monitor treatment for     MRSA infections.     RESULT CALLED TO, READ BACK BY AND VERIFIED WITH:     C.DENNY,RN AT 2320 ON 05/13/13 BY W.SHEA  CULTURE, BLOOD (ROUTINE X 2)     Status: None   Collection Time    05/13/13  8:35 PM      Result Value Ref Range Status   Specimen Description BLOOD RIGHT ARM   Final   Special Requests BOTTLES DRAWN AEROBIC AND ANAEROBIC 4ML   Final   Culture  Setup Time     Final   Value: 05/14/2013 01:08     Performed at [facility]   Culture     Final   Value:        BLOOD CULTURE RECEIVED NO GROWTH TO DATE CULTURE WILL BE HELD FOR 5 DAYS BEFORE ISSUING A FINAL NEGATIVE REPORT     Performed at [facility]   Report Status PENDING   Incomplete  CULTURE, BLOOD (ROUTINE X 2)     Status: None   Collection Time    05/13/13  8:40 PM      Result Value Ref Range Status   Specimen Description BLOOD RIGHT HAND   Final   Special Requests BOTTLES DRAWN AEROBIC AND ANAEROBIC 4ML   Final   Culture  Setup Time     Final   Value: 05/14/2013 01:08     Performed at [facility]   Culture     Final   Value:        BLOOD CULTURE RECEIVED NO GROWTH TO DATE CULTURE WILL BE HELD FOR 5 DAYS BEFORE ISSUING A FINAL NEGATIVE REPORT     Performed at [facility]   Report Status PENDING   Incomplete    Studies: No results found.  Scheduled Meds: . folic acid  1 mg Oral Daily  . HYDROmorphone PCA 0.3 mg/mL   Intravenous 6 times per day  . hydroxyurea  1,000 mg Oral Daily  . ketorolac  15 mg Intravenous 4 times per day  . levofloxacin  750 mg  Oral Daily  . morphine  15 mg Oral BID  . mupirocin ointment  1 application Nasal BID  . polyethylene glycol  17 g Oral Daily  . potassium chloride SA  20 mEq Oral q morning - 10a  . rivaroxaban  20 mg Oral Q supper  . senna  1 tablet Oral q12n4p  . [START ON 05/19/2013] Vitamin D (Ergocalciferol)  50,000 Units Oral Weekly   Continuous Infusions: . dextrose 5 % and 0.45% NaCl 20 mL/hr at  05/17/13 2354   Principal Problem:   Acute chest syndrome Active Problems:   Mood disorder   Chronic anticoagulation   Sickle cell crisis   Acute sickle cell crisis   Hypokalemia   Former smoker   Sickle-cell crisis with associated acute chest syndrome   Sickle-cell anemia with associated acute chest syndrome  Raven Harmes L Eniola Cerullo  [facility] Pager [phone].  If 7PM-7AM, please contact night-coverage at [email or web address], password [facility] 05/18/2013, 7:57 AM  LOS: 5 days

## 2013-05-19 DIAGNOSIS — Z7901 Long term (current) use of anticoagulants: Secondary | ICD-10-CM | POA: Diagnosis not present

## 2013-05-19 DIAGNOSIS — Z7289 Other problems related to lifestyle: Secondary | ICD-10-CM | POA: Diagnosis not present

## 2013-05-19 DIAGNOSIS — I5030 Unspecified diastolic (congestive) heart failure: Secondary | ICD-10-CM | POA: Diagnosis not present

## 2013-05-19 DIAGNOSIS — M545 Low back pain, unspecified: Secondary | ICD-10-CM

## 2013-05-19 DIAGNOSIS — D5700 Hb-SS disease with crisis, unspecified: Secondary | ICD-10-CM | POA: Diagnosis not present

## 2013-05-19 LAB — COMPREHENSIVE METABOLIC PANEL
ALT: 39 U/L (ref 0–53)
AST: 91 U/L — AB (ref 0–37)
Albumin: 4.6 g/dL (ref 3.5–5.2)
Alkaline Phosphatase: 101 U/L (ref 39–117)
BILIRUBIN TOTAL: 11.2 mg/dL — AB (ref 0.3–1.2)
BUN: 12 mg/dL (ref 6–23)
CHLORIDE: 102 meq/L (ref 96–112)
CO2: 21 mEq/L (ref 19–32)
Calcium: 9 mg/dL (ref 8.4–10.5)
Creatinine, Ser: 0.68 mg/dL (ref 0.50–1.35)
GFR calc Af Amer: >90 mL/min (ref >90)
Glucose, Bld: 125 mg/dL — ABNORMAL HIGH (ref 70–99)
Potassium: 4.2 mEq/L (ref 3.7–5.3)
Sodium: 136 mEq/L — ABNORMAL LOW (ref 137–147)
Total Protein: 8.2 g/dL (ref 6.0–8.3)

## 2013-05-19 LAB — CBC
HEMATOCRIT: 19.9 % — AB (ref 39.0–52.0)
Hemoglobin: 7.3 g/dL — ABNORMAL LOW (ref 13.0–17.0)
MCH: 30.2 pg (ref 26.0–34.0)
MCHC: 36.7 g/dL — ABNORMAL HIGH (ref 30.0–36.0)
MCV: 82.2 fL (ref 78.0–100.0)
Platelets: 527 10*3/uL — ABNORMAL HIGH (ref 150–400)
RBC: 2.42 MIL/uL — AB (ref 4.22–5.81)
RDW: 20.5 % — ABNORMAL HIGH (ref 11.5–15.5)
WBC: 20.6 10*3/uL — AB (ref 4.0–10.5)

## 2013-05-19 LAB — BILIRUBIN, FRACTIONATED(TOT/DIR/INDIR)
Bilirubin, Direct: 0.8 mg/dL — ABNORMAL HIGH (ref 0.0–0.3)
Indirect Bilirubin: 10.2 mg/dL — ABNORMAL HIGH (ref 0.3–0.9)
Total Bilirubin: 11 mg/dL — ABNORMAL HIGH (ref 0.3–1.2)

## 2013-05-19 LAB — CK: CK TOTAL: 84 U/L (ref 7–232)

## 2013-05-19 MED ORDER — HYDROMORPHONE HCL 4 MG PO TABS
4.0000 mg | ORAL_TABLET | ORAL | Status: DC
  Administered 2013-05-19 – 2013-05-20 (×6): 4 mg via ORAL
  Filled 2013-05-19 (×6): qty 1

## 2013-05-19 MED ORDER — HYDROMORPHONE HCL PF 1 MG/ML IJ SOLN
0.5000 mg | INTRAMUSCULAR | Status: DC | PRN
  Administered 2013-05-19 – 2013-05-20 (×6): 0.5 mg via INTRAVENOUS
  Filled 2013-05-19 (×6): qty 1

## 2013-05-19 MED ORDER — HYDROMORPHONE 0.3 MG/ML IV SOLN
INTRAVENOUS | Status: DC
  Administered 2013-05-19: 2.75 mg via INTRAVENOUS
  Administered 2013-05-19: 09:00:00 via INTRAVENOUS
  Administered 2013-05-19: 6.27 mg via INTRAVENOUS
  Administered 2013-05-19: 7.2 mg via INTRAVENOUS
  Administered 2013-05-20: 4.79 mg via INTRAVENOUS
  Administered 2013-05-20: 08:00:00 via INTRAVENOUS
  Administered 2013-05-20: 7.57 mg via INTRAVENOUS
  Filled 2013-05-19 (×5): qty 25

## 2013-05-19 NOTE — Progress Notes (Signed)
PHYSICIAN PROGRESS NOTE  [name] ZSW:[id number] DOB: 02-19-1980 DOA: 05/13/2013  05/19/2013   PCP: [name],[name], MD  Admission History: Patient is a 33 year old gentleman well known to sickle cell service who presented [facility] the [facility] cell [facility] hospital with pain all over but more localized to his legs and lower back. He was initiated on IV Dilaudid PCA and Toradol as well as supportive care. He continued to have more pain rated as 8/10 not getting any better. Also having severe itching the pain was not relieved by medication and was exacerbated by any movement. He has some shortness of breath but denies cough. He was recently admitted into the hospital which time he had acute chest syndrome. He has apparently been sick for 2 weeks but decided not to come in because he was waiting for his birthday which was this past weekend. He planned and as executed a party at the time. He has been drinking excessively recently including this past weekend where he drank vodka. Overall these apparently have made him have more pain. He's been taking all his medications at home although objective findings have shown he is not taking his hydroxyurea. Patient was evaluated now and had oxygen saturations between 80-90% on room air, and has chest x-ray indicated acute chest syndrome hence he is being admitted to the hospital.  Assessment/Plan: 1. Acute Chest Syndrome: Pt has had 2nd episode of acute chest this year. Pain is better controlled now with the addition of clinician-assisted dosing. I am slowly decreasing the PCA dose as well as the clinician-assisted doses of dilaudid.  I am going to schedule his home dilaudid tabs today.  I will continue the PCA and clinician-assisted doses of dilaudid but at reduced dosing.  Continue oral levaquin to complete an 8 day course of treatment. Pt has been weaned to room air but now has been restarted on nasal cannula - will wean to room air today. Encouraging incentive  spirometry as patient has not been using it as recommended.  He was counseled further today. CT angio negative for PE and EKG/ troponin no acute findings. Will encourage more ambulation today in anticipation of patient going home.   2. Hb SS with Crisis: Pt is reporting slightly better pain control today. He has used 37.38 mg dilaudid with 50 demands, 49 deliveries in last 24 hours. Will continue PCA and reduced clinician assisted dosing. Added home oral dilaudid and will schedule it regularly today.  ContinueToradol as an adjunct treatment for inflammation and long-acting MS Contin.  3. Leukocytosis: Likely secondary to crisis. Will continue to monitor.  4. Hyperbilirubinemia - likely this is secondary to ongoing hemolysis in the setting of acute vaso-occlusive event, will continue to follow.  Checking fractionated bilirubin.  5. Severe Vit D Deficiency - Pt's vit D level is 10, Continue Drisdol 50,000 IU caps as ordered.  6. ETOH consumption: Pt has been drinking ETOH on weekends. He has exhibited no signs of withdrawal however willl continue to observe closely. This has been addressed with patient and will be further addressed outpatient with PCP. He is willing to cut down on drinking now that he is back with his girlfriend. 7. 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 No evidence of active bleeding. With episode yesterday, I am concerned about recurrence (pt has had some noncompliance with taking medications at home) CT angio of chest negative for PE.   8. Cardiomegaly: this is a chronic finding, given cxr findings of pulmonary edema and further cardiomegaly. ECHO  reveals normal pump function but grade 1 diastolic dysfunction.  9. Chronic Opiate dependence: Pt opiate tolerant and on MS Contin for treatment of chronic pain. 10. Sickle Cell Disease: Continue Hydrea and Folic Acid. 11. Chronic Hypokalemia - repleted and following. Continue supplementation.  12. 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 He has no particular reason. He is aware of the risk of Iron overload to his organs up to and including death.  Code Status: Full Code  Family Communication: counseled patient at bedside   HPI/Subjective: Pt was found ambulating in the room this morning.  He says he is feeling a little better today.  He is still having pain in back and legs and pain ranges 6-8/10.  He does not report that he is having any chest pain today.  He has not had a BM but has stopped refusing to take the laxatives and has started taking them again.   He denies abdominal pain and nausea at this time and reports that it has resolved.  He denies shortness of breath.  He does not report any significant itching today.    Objective: Filed Vitals:   05/19/13 0708  BP:   Pulse:   Temp:   Resp: 17    Intake/Output Summary (Last 24 hours) at 05/19/13 5009 Last data filed at 05/19/13 0600  Gross per 24 hour  Intake   3080 ml  Output   1650 ml  Net   1430 ml   Filed Weights   05/13/13 0835 05/13/13 1724  Weight: 172 lb (78.019 kg) 169 lb 12.1 oz (77 kg)   Review Of Systems: As detailed above, otherwise all reviewed and negative  Exam:  General: chronically ill appearing, lethargic but easily arousable, oriented x3, in no acute distress.  HEENT: [place]/AT PEERL, EOMI, bilateral scleral icterus unchanged  OROPHARYNX: Pale mucosa. Moist, No exudate/ erythema/lesions.  Heart: Regular rate and rhythm, without murmurs, rubs, gallops.  Lungs: bilateral BS clear, no wheezing or rhonchi noted.  Abdomen: Soft, nontender, nondistended, positive bowel sounds, no masses no hepatosplenomegaly noted.  Neuro: No focal neurological deficits noted cranial nerves II through XII grossly intact. Strength normal in bilateral upper and lower extremities.  Musculoskeletal: No warmth, swelling or erythema around joints, lower back tenderness to palpation noted.  Psychiatric: Patient alert and oriented x3, good insight  and cognition, good recent to remote recall.  Data Reviewed: Basic Metabolic Panel:  Recent Labs Lab 05/15/13 0500 05/16/13 0540 05/17/13 0545 05/18/13 0615 05/19/13 0615  NA 137 137 137 140 136*  K 4.1 4.4 4.8 4.8 4.2  CL 103 103 103 106 102  CO2 21 21 22 20 21   GLUCOSE 95 112* 130* 117* 125*  BUN 7 8 13 16 12   CREATININE 0.57 0.56 0.66 0.63 0.68  CALCIUM 8.8 9.1 9.2 8.8 9.0   Liver Function Tests:  Recent Labs Lab 05/15/13 0500 05/16/13 0540 05/17/13 0545 05/18/13 0615 05/19/13 0615  AST 35 47* 50* 71* 91*  ALT 19 24 23  32 39  ALKPHOS 100 105 90 99 101  BILITOT 4.5* 5.0* 6.8* 7.6* 11.2*  PROT 7.3 7.8 7.7 8.1 8.2  ALBUMIN 4.2 4.4 4.3 4.4 4.6   No results found for this basename: LIPASE, AMYLASE,  in the last 168 hours No results found for this basename: AMMONIA,  in the last 168 hours CBC:  Recent Labs Lab 05/13/13 0900  05/14/13 0545 05/15/13 0500 05/16/13 0540 05/17/13 0545 05/18/13 0615 05/19/13 0615  WBC 19.9*  < >  23.6* 20.2* 20.7* 20.7* 23.8* 20.6*  NEUTROABS 14.7*  --  16.5* 13.0* 13.7* 15.9*  --   --   HGB 8.3*  < > 7.9* 7.9* 8.3* 7.5* 7.2* 7.3*  HCT 24.7*  < > 23.3* 22.6* 23.3* 20.1* 19.8* 19.9*  MCV 93.9  < > 93.6 90.0 85.7 82.7 82.8 82.2  PLT 473*  < > 464* 526* 474* 482* 485* 527*  < > = values in this interval not displayed. Cardiac Enzymes:  Recent Labs Lab 05/18/13 1105 05/19/13 0615  CKTOTAL 85 84  TROPONINI <0.30  --    BNP (last 3 results)  Recent Labs  11/13/12 1826  PROBNP 106.3   CBG: No results found for this basename: GLUCAP,  in the last 168 hours  Recent Results (from the past 240 hour(s))  MRSA PCR SCREENING     Status: Abnormal   Collection Time    05/13/13  6:00 PM      Result Value Ref Range Status   MRSA by PCR POSITIVE (*) NEGATIVE Final   Comment:            The GeneXpert MRSA Assay (FDA     approved for NASAL specimens     only), is one component of a     comprehensive MRSA colonization      surveillance program. It is not     intended to diagnose MRSA     infection nor to guide or     monitor treatment for     MRSA infections.     RESULT CALLED TO, READ BACK BY AND VERIFIED WITH:     C.DENNY,RN AT 2320 ON 05/13/13 BY W.SHEA  CULTURE, BLOOD (ROUTINE X 2)     Status: None   Collection Time    05/13/13  8:35 PM      Result Value Ref Range Status   Specimen Description BLOOD RIGHT ARM   Final   Special Requests BOTTLES DRAWN AEROBIC AND ANAEROBIC 4ML   Final   Culture  Setup Time     Final   Value: 05/14/2013 01:08     Performed at [facility]   Culture     Final   Value:        BLOOD CULTURE RECEIVED NO GROWTH TO DATE CULTURE WILL BE HELD FOR 5 DAYS BEFORE ISSUING A FINAL NEGATIVE REPORT     Performed at [facility]   Report Status PENDING   Incomplete  CULTURE, BLOOD (ROUTINE X 2)     Status: None   Collection Time    05/13/13  8:40 PM      Result Value Ref Range Status   Specimen Description BLOOD RIGHT HAND   Final   Special Requests BOTTLES DRAWN AEROBIC AND ANAEROBIC 4ML   Final   Culture  Setup Time     Final   Value: 05/14/2013 01:08     Performed at [facility]   Culture     Final   Value:        BLOOD CULTURE RECEIVED NO GROWTH TO DATE CULTURE WILL BE HELD FOR 5 DAYS BEFORE ISSUING A FINAL NEGATIVE REPORT     Performed at [facility]   Report Status PENDING   Incomplete   Studies: Ct Angio Chest Pe W/cm &/or Wo Cm  05/18/2013   CLINICAL DATA:  Sickle-cell crisis with chest pain. Evaluate for pulmonary embolus.  EXAM: CT ANGIOGRAPHY CHEST WITH CONTRAST  TECHNIQUE: Multidetector CT imaging of the  chest was performed using the standard protocol during bolus administration of intravenous contrast. Multiplanar CT image reconstructions and MIPs were obtained to evaluate the vascular anatomy.  CONTRAST:  142mL OMNIPAQUE IOHEXOL 350 MG/ML SOLN  COMPARISON:  Chest x-ray 05/14/2013.  CT chest 03/26/2013.  FINDINGS: Mediastinum: No  filling defects are noted within the pulmonary arterial tree to suggest pulmonary emboli. Heart size is increased. No pericardial fluid, thickening or calcification. No acute abnormality of the thoracic aorta or other great vessels of the mediastinum. Mild right hilar and mediastinal lymph node prominence, stable. The esophagus is normal in appearance. Layering mucus within the proximal right mainstem bronchus. Stable Port-A-Cath.  Lungs/Pleura: No pleural effusions. No suspicious appearing pulmonary nodules or masses. Peripheral areas of ground-glass opacity in both lungs, likely representing a acute chest syndrome. Small vessel infarction with hemorrhagic edema not excluded. No definite bronchopneumonia.  Upper Abdomen: Visualized portions of the upper abdomen are unremarkable. 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  Musculoskeletal: No aggressive appearing lytic or blastic lesions are noted in the visualized portions of the skeleton. Sickle cell endplate infarctions.  Review of the MIP images confirms the above findings.  IMPRESSION: No pulmonary embolus. Stable peripheral ground-glass opacities, without definite bronchopneumonia.   Electronically Signed   By: Rolla Flatten M.D.   On: 05/18/2013 09:16   Scheduled Meds: . folic acid  1 mg Oral Daily  . HYDROmorphone PCA 0.3 mg/mL   Intravenous 6 times per day  . HYDROmorphone  4 mg Oral Q4H  . hydroxyurea  1,000 mg Oral Daily  . levofloxacin  750 mg Oral Daily  . morphine  15 mg Oral BID  . polyethylene glycol  17 g Oral Daily  . potassium chloride SA  20 mEq Oral q morning - 10a  . rivaroxaban  20 mg Oral Q supper  . senna  1 tablet Oral q12n4p  . Vitamin D (Ergocalciferol)  50,000 Units Oral Weekly   Continuous Infusions: . dextrose 5 % and 0.45% NaCl 50 mL/hr at 05/18/13 1000   Principal Problem:   Acute chest syndrome Active Problems:   Mood disorder   Chronic anticoagulation   Sickle cell  crisis   Low back pain   Acute sickle cell crisis   Hypokalemia   Former smoker   Sickle-cell crisis with associated acute chest syndrome   Sickle-cell anemia with associated acute chest syndrome   Bilateral leg pain   Diastolic congestive heart failure, NYHA class 1  [place] [employer] [phone].  If 7PM-7AM, please contact night-coverage at [email or web address], password [facility] 05/19/2013, 8:08 AM  LOS: 6 days

## 2013-05-19 NOTE — Progress Notes (Signed)
Patient Controlled Analgesia 24 hour Summary  Total mg delivered: 37.38 mg Number of demands: 50 Number of deliveries: 3

## 2013-05-20 DIAGNOSIS — Z7901 Long term (current) use of anticoagulants: Secondary | ICD-10-CM | POA: Diagnosis not present

## 2013-05-20 DIAGNOSIS — Z7289 Other problems related to lifestyle: Secondary | ICD-10-CM | POA: Diagnosis not present

## 2013-05-20 DIAGNOSIS — I5030 Unspecified diastolic (congestive) heart failure: Secondary | ICD-10-CM | POA: Diagnosis not present

## 2013-05-20 DIAGNOSIS — Z9189 Other specified personal risk factors, not elsewhere classified: Secondary | ICD-10-CM

## 2013-05-20 DIAGNOSIS — D5700 Hb-SS disease with crisis, unspecified: Secondary | ICD-10-CM | POA: Diagnosis not present

## 2013-05-20 LAB — VITAMIN D 25 HYDROXY (VIT D DEFICIENCY, FRACTURES): Vit D, 25-Hydroxy: 10 ng/mL — ABNORMAL LOW (ref 30–89)

## 2013-05-20 LAB — CULTURE, BLOOD (ROUTINE X 2)
CULTURE: NO GROWTH
Culture: NO GROWTH

## 2013-05-20 LAB — COMPREHENSIVE METABOLIC PANEL
ALT: 38 U/L (ref 0–53)
AST: 78 U/L — ABNORMAL HIGH (ref 0–37)
Albumin: 4 g/dL (ref 3.5–5.2)
Alkaline Phosphatase: 92 U/L (ref 39–117)
BILIRUBIN TOTAL: 9.1 mg/dL — AB (ref 0.3–1.2)
BUN: 8 mg/dL (ref 6–23)
CHLORIDE: 103 meq/L (ref 96–112)
CO2: 24 meq/L (ref 19–32)
Calcium: 9.2 mg/dL (ref 8.4–10.5)
Creatinine, Ser: 0.51 mg/dL (ref 0.50–1.35)
GLUCOSE: 124 mg/dL — AB (ref 70–99)
Potassium: 4 mEq/L (ref 3.7–5.3)
Sodium: 139 mEq/L (ref 137–147)
Total Protein: 7.4 g/dL (ref 6.0–8.3)

## 2013-05-20 LAB — CBC
HEMATOCRIT: 19.5 % — AB (ref 39.0–52.0)
Hemoglobin: 7.1 g/dL — ABNORMAL LOW (ref 13.0–17.0)
MCH: 30.7 pg (ref 26.0–34.0)
MCHC: 36.4 g/dL — ABNORMAL HIGH (ref 30.0–36.0)
MCV: 84.4 fL (ref 78.0–100.0)
Platelets: 534 10*3/uL — ABNORMAL HIGH (ref 150–400)
RBC: 2.31 MIL/uL — AB (ref 4.22–5.81)
RDW: 21.1 % — ABNORMAL HIGH (ref 11.5–15.5)
WBC: 14.3 10*3/uL — AB (ref 4.0–10.5)

## 2013-05-20 MED ORDER — LEVOFLOXACIN 750 MG PO TABS: 750.0000 mg | ORAL_TABLET | Freq: Every day | ORAL | Status: DC

## 2013-05-20 MED ORDER — HEPARIN SOD (PORK) LOCK FLUSH 100 UNIT/ML IV SOLN
500.0000 [IU] | INTRAVENOUS | Status: AC | PRN
  Administered 2013-05-20: 500 [IU]
  Filled 2013-05-20: qty 5

## 2013-05-20 NOTE — Progress Notes (Signed)
Patient Controlled Analgesia 24 hour Summary  Total mg delivered: 27.36 Number of demands: 50 Number of deliveries: 57

## 2013-05-20 NOTE — Progress Notes (Addendum)
Discharge instructions explained to pt, prescription given to pt. Port flushed with 500 units heparin. Pt had not used any PCA Dilaudid @ 1200

## 2013-05-20 NOTE — Discharge Instructions (Signed)
Back Pain, Adult Low back pain is very common. About 1 in 5 people have back pain.The cause of low back pain is rarely dangerous. The pain often gets better over time.About half of people with a sudden onset of back pain feel better in just 2 weeks. About 8 in 10 people feel better by 6 weeks.  CAUSES Some common causes of back pain include:  Strain of the muscles or ligaments supporting the spine.  Wear and tear (degeneration) of the spinal discs.  Arthritis.  Direct injury to the back. DIAGNOSIS Most of the time, the direct cause of low back pain is not known.However, back pain can be treated effectively even when the exact cause of the pain is unknown.Answering your caregiver's questions about your overall health and symptoms is one of the most accurate ways to make sure the cause of your pain is not dangerous. If your caregiver needs more information, he or she may order lab work or imaging tests (X-rays or MRIs).However, even if imaging tests show changes in your back, this usually does not require surgery. HOME CARE INSTRUCTIONS For many people, back pain returns.Since low back pain is rarely dangerous, it is often a condition that people can learn to [facility] their own.   Remain active. It is stressful on the back to sit or stand in one place. Do not sit, drive, or stand in one place for more than 30 minutes at a time. Take short walks on level surfaces as soon as pain allows.Try to increase the length of time you walk each day.  Do not stay in bed.Resting more than 1 or 2 days can delay your recovery.  Do not avoid exercise or work.Your body is made to move.It is not dangerous to be active, even though your back may hurt.Your back will likely heal faster if you return to being active before your pain is gone.  Pay attention to your body when you bend and lift. Many people have less discomfortwhen lifting if they bend their knees, keep the load close to their bodies,and  avoid twisting. Often, the most comfortable positions are those that put less stress on your recovering back.  Find a comfortable position to sleep. Use a firm mattress and lie on your side with your knees slightly bent. If you lie on your back, put a pillow under your knees.  Only take over-the-counter or prescription medicines as directed by your caregiver. Over-the-counter medicines to reduce pain and inflammation are often the most helpful.Your caregiver may prescribe muscle relaxant drugs.These medicines help dull your pain so you can more quickly return to your normal activities and healthy exercise.  Put ice on the injured area.  Put ice in a plastic bag.  Place a towel between your skin and the bag.  Leave the ice on for 15-20 minutes, 03-04 times a day for the first 2 to 3 days. After that, ice and heat may be alternated to reduce pain and spasms.  Ask your caregiver about trying back exercises and gentle massage. This may be of some benefit.  Avoid feeling anxious or stressed.Stress increases muscle tension and can worsen back pain.It is important to recognize when you are anxious or stressed and learn ways to manage it.Exercise is a great option. SEEK MEDICAL CARE IF:  You have pain that is not relieved with rest or medicine.  You have pain that does not improve in 1 week.  You have new symptoms.  You are generally not feeling well. SEEK  IMMEDIATE MEDICAL CARE IF:   You have pain that radiates from your back into your legs.  You develop new bowel or bladder control problems.  You have unusual weakness or numbness in your arms or legs.  You develop nausea or vomiting.  You develop abdominal pain.  You feel faint. Document Released: 01/17/2005 Document Revised: 07/19/2011 Document Reviewed: 06/07/2010 [facility] Patient Information 2014 [place], [place].  Hypokalemia Hypokalemia means that the amount of potassium in the blood is lower than normal.Potassium is a  chemical, called an electrolyte, that helps regulate the amount of fluid in the body. It also stimulates muscle contraction and helps nerves function properly.Most of the body's potassium is inside of cells, and only a very small amount is in the blood. Because the amount in the blood is so small, minor changes can be life-threatening. CAUSES  Antibiotics.  Diarrhea or vomiting.  Using laxatives too much, which can cause diarrhea.  Chronic kidney disease.  Water pills (diuretics).  Eating disorders (bulimia).  Low magnesium level.  Sweating a lot. SIGNS AND SYMPTOMS  Weakness.  Constipation.  Fatigue.  Muscle cramps.  Mental confusion.  Skipped heartbeats or irregular heartbeat (palpitations).  Tingling or numbness. DIAGNOSIS  Your health care provider can diagnose hypokalemia with blood tests. In addition to checking your potassium level, your health care provider may also check other lab tests. TREATMENT Hypokalemia can be treated with potassium supplements taken by mouth or adjustments in your current medicines. If your potassium level is very low, you may need to get potassium through a vein (IV) and be monitored in the hospital. A diet high in potassium is also helpful. Foods high in potassium are:  Nuts, such as peanuts and pistachios.  Seeds, such as sunflower seeds and pumpkin seeds.  Peas, lentils, and lima beans.  Whole grain and bran cereals and breads.  Fresh fruit and vegetables, such as apricots, avocado, bananas, cantaloupe, kiwi, oranges, tomatoes, asparagus, and potatoes.  Orange and tomato juices.  Red meats.  Fruit yogurt. HOME CARE INSTRUCTIONS  Take all medicines as prescribed by your health care provider.  Maintain a healthy diet by including nutritious food, such as fruits, vegetables, nuts, whole grains, and lean meats.  If you are taking a laxative, be sure to follow the directions on the label. SEEK MEDICAL CARE IF:  Your  weakness gets worse.  You feel your heart pounding or racing.  You are vomiting or having diarrhea.  You are diabetic and having trouble keeping your blood glucose in the normal range. SEEK IMMEDIATE MEDICAL CARE IF:  You have chest pain, shortness of breath, or dizziness.  You are vomiting or having diarrhea for more than 2 days.  You faint. MAKE SURE YOU:   Understand these instructions.  Will watch your condition.  Will get help right away if you are not doing well or get worse. Document Released: 01/17/2005 Document Revised: 11/07/2012 Document Reviewed: 07/20/2012 [facility] Patient Information 2014 [place].  Jaundice  Jaundice is when the skin, whites of the eyes, and mucous membranes turn a yellowish color. It is caused by high levels of bilirubin in the blood. Bilirubin is produced by the normal breakdown of red blood cells. Jaundice may mean the liver or bile system in your body is not working right. HOME CARE  Rest.  Drink enough fluids to keep your pee (urine) clear or pale yellow.  Do not drink alcohol.  Only take medicine as told by your doctor.  If you have jaundice because  of viral hepatitis or an infection:  Avoid close contact with people.  Avoid making food for others.  Avoid sharing eating utensils with others.  Wash your hands often.  Keep all follow-up visits with your doctor.  Use skin lotion to help with itching. GET HELP RIGHT AWAY IF:  You have more pain.  You keep throwing up (vomiting).  You lose too much body fluid (dehydration).  You have a fever or persistent symptoms for more than 72 hours.  You have a fever and your symptoms suddenly get worse.  You become weak or confused.  You develop a severe headache. MAKE SURE YOU:  Understand these instructions.  Will watch your condition.  Will get help right away if you are not doing well or get worse. Document Released: 02/19/2010 Document Revised: 04/11/2011  Document Reviewed: 02/19/2010 [facility] Patient Information 2014 [place], [place].  Chronic Pain Chronic pain can be defined as pain that is off and on and lasts for 3 6 months or longer. Many things cause chronic pain, which can make it difficult to make a diagnosis. There are many treatment options available for chronic pain. However, finding a treatment that works well for you may require trying various approaches until the right one is found. Many people benefit from a combination of two or more types of treatment to control their pain. SYMPTOMS  Chronic pain can occur anywhere in the body and can range from mild to very severe. Some types of chronic pain include:  Headache.  Low back pain.  Cancer pain.  Arthritis pain.  Neurogenic pain. This is pain resulting from damage to nerves. People with chronic pain may also have other symptoms such as:  Depression.  Anger.  Insomnia.  Anxiety. DIAGNOSIS  Your health care provider will help diagnose your condition over time. In many cases, the initial focus will be on excluding possible conditions that could be causing the pain. Depending on your symptoms, your health care provider may order tests to diagnose your condition. Some of these tests may include:   Blood tests.   CT scan.   MRI.   X-rays.   Ultrasounds.   Nerve conduction studies.  You may need to see a specialist.  TREATMENT  Finding treatment that works well may take time. You may be referred to a pain specialist. He or she may prescribe medicine or therapies, such as:   Mindful meditation or yoga.  Shots (injections) of numbing or pain-relieving medicines into the spine or area of pain.  Local electrical stimulation.  Acupuncture.   Massage therapy.   Aroma, color, light, or sound therapy.   Biofeedback.   Working with a physical therapist to keep from getting stiff.   Regular, gentle exercise.   Cognitive or behavioral therapy.    Group support.  Sometimes, surgery may be recommended.  HOME CARE INSTRUCTIONS   Take all medicines as directed by your health care provider.   Lessen stress in your life by relaxing and doing things such as listening to calming music.   Exercise or be active as directed by your health care provider.   Eat a healthy diet and include things such as vegetables, fruits, fish, and lean meats in your diet.   Keep all follow-up appointments with your health care provider.   Attend a support group with others suffering from chronic pain. SEEK MEDICAL CARE IF:   Your pain gets worse.   You develop a new pain that was not there before.   You  cannot tolerate medicines given to you by your health care provider.   You have new symptoms since your last visit with your health care provider.  SEEK IMMEDIATE MEDICAL CARE IF:   You feel weak.   You have decreased sensation or numbness.   You lose control of bowel or bladder function.   Your pain suddenly gets much worse.   You develop shaking.  You develop chills.  You develop confusion.  You develop chest pain.  You develop shortness of breath.  MAKE SURE YOU:  Understand these instructions.  Will watch your condition.  Will get help right away if you are not doing well or get worse. Document Released: 10/09/2001 Document Revised: 09/19/2012 Document Reviewed: 07/13/2012 [facility] Patient Information 2014 [place].  Alcohol and Nutrition Nutrition serves two purposes. It provides energy. It also maintains body structure and function. Food supplies energy. It also provides the building blocks needed to replace worn or damaged cells. Alcoholics often eat poorly. This limits their supply of essential nutrients. This affects energy supply and structure maintenance. Alcohol also affects the body's nutrients in:  Digestion.  Storage.  Using and getting rid of waste products. IMPAIRMENT OF NUTRIENT  DIGESTION AND UTILIZATION   Once ingested, food must be broken down into small components (digested). Then it is available for energy. It helps maintain body structure and function. Digestion begins in the mouth. It continues in the stomach and intestines, with help from the pancreas. The nutrients from digested food are absorbed from the intestines into the blood. Then they are carried to the liver. The liver prepares nutrients for:  Immediate use.  Storage and future use.  Alcohol inhibits the breakdown of nutrients into usable molecules.  It decreases secretion of digestive enzymes from the pancreas.  Alcohol impairs nutrient absorption by damaging the cells lining the stomach and intestines.  It also interferes with moving some nutrients into the blood.  In addition, nutritional deficiencies themselves may lead to further absorption problems.  For example, folate deficiency changes the cells that line the small intestine. This impairs how water is absorbed. It also affects absorbed nutrients. These include glucose, sodium, and additional folate.  Even if nutrients are digested and absorbed, alcohol can prevent them from being fully used. It changes their transport, storage, and excretion. Impaired utilization of nutrients by alcoholics is indicated by:  Decreased liver stores of vitamins, such as vitamin A.  Increased excretion of nutrients such as fat. ALCOHOL AND ENERGY SUPPLY   Three basic nutritional components found in food are:  Carbohydrates.  Proteins.  Fats.  These are used as energy. Some alcoholics take in as much as 50% of their total daily calories from alcohol. They often neglect important foods.  Even when enough food is eaten, alcohol can impair the ways the body controls blood sugar (glucose) levels. It may either increase or decrease blood sugar.  In non-diabetic alcoholics, increased blood sugar (hyperglycemia) is caused by poor insulin secretion. It is  usually temporary.  Decreased blood sugar (hypoglycemia) can cause serious injury even if this condition is short-lived. Low blood sugar can happen when a fasting or malnourished person drinks alcohol. When there is no food to supply energy, stored sugar is used up. The products of alcohol inhibit forming glucose from other compounds such as amino acids. As a result, alcohol causes the brain and other body tissue to lack glucose. It is needed for energy and function.  Alcohol is an energy source. But how the body  processes and uses the energy from alcohol is complex. Also, when alcohol is substituted for carbohydrates, subjects tend to lose weight. This indicates that they get less energy from alcohol than from food. ALCOHOL - MAINTAINING CELL STRUCTURE AND FUNCTION  Structure Cells are made mostly of protein. So an adequate protein diet is important for maintaining cell structure. This is especially true if cells are being damaged. Research indicates that alcohol affects protein nutrition by causing impaired:  Digestion of proteins to amino acids.  Processing of amino acids by the small intestine and liver.  Synthesis of proteins from amino acids.  Protein secretion by the liver. Function Nutrients are essential for the body to function well. They provide the tools that the body needs to work well:   Proteins.  Vitamins.  Minerals. Alcohol can disrupt body function. It may cause nutrient deficiencies. And it may interfere with the way nutrients are processed. Vitamins  Vitamins are essential to maintain growth and normal metabolism. They regulate many of the body`s processes. Chronic heavy drinking causes deficiencies in many vitamins. This is caused by eating less. And, in some cases, vitamins may be poorly absorbed. For example, alcohol inhibits fat absorption. It impairs how the vitamins A, E, and D are normally absorbed along with dietary fats. Not enough vitamin A may cause night  blindness. Not enough vitamin D may cause softening of the bones.  Some alcoholics lack vitamins A, C, D, E, K, and the B vitamins. These are all involved in wound healing and cell maintenance. In particular, because vitamin K is necessary for blood clotting, lacking that vitamin can cause delayed clotting. The result is excess bleeding. Lacking other vitamins involved in brain function may cause severe neurological damage. Minerals Deficiencies of minerals such as calcium, magnesium, iron, and zinc are common in alcoholics. The alcohol itself does not seem to affect how these minerals are absorbed. Rather, they seem to occur secondary to other alcohol-related problems, such as:  Less calcium absorbed.  Not enough magnesium.  More urinary excretion.  Vomiting.  Diarrhea.  Not enough iron due to gastrointestinal bleeding.  Not enough zinc or losses related to other nutrient deficiencies.  Mineral deficiencies can cause a variety of medical consequences. These range from calcium-related bone disease to zinc-related night blindness and skin lesions. ALCOHOL, MALNUTRITION, AND MEDICAL COMPLICATIONS  Liver Disease   Alcoholic liver damage is caused primarily by alcohol itself. But poor nutrition may increase the risk of alcohol-related liver damage. For example, nutrients normally found in the liver are known to be affected by drinking alcohol. These include carotenoids, which are the major sources of vitamin A, and vitamin E compounds. Decreases in such nutrients may play some role in alcohol-related liver damage. Pancreatitis  Research suggests that malnutrition may increase the risk of developing alcoholic pancreatitis. Research suggests that a diet lacking in protein may increase alcohol's damaging effect on the pancreas. Brain  Nutritional deficiencies may have severe effects on brain function. These may be permanent. Specifically, thiamine deficiencies are often seen in alcoholics.  They can cause severe neurological problems. These include:  Impaired movement.  Memory loss seen in Wernicke-Korsakoff syndrome. Pregnancy  Alcohol has toxic effects on fetal development. It causes alcohol-related birth defects. They include fetal alcohol syndrome. Alcohol itself is toxic to the fetus. Also, the nutritional deficiency can affect how the fetus develops. That may compound the risk of developmental damage.  Nutritional needs during pregnancy are 10% to 30% greater than normal. Food intake can increase  by as much as 140% to cover the needs of both mother and fetus. An alcoholic mother`s nutritional problems may adversely affect the nutrition of the fetus. And alcohol itself can also restrict nutrition flow to the fetus. NUTRITIONAL STATUS OF ALCOHOLICS  Techniques for assessing nutritional status include:  Taking body measurements to estimate fat reserves. They include:  Weight.  Height.  Mass.  Skin fold thickness.  Performing blood analysis to provide measurements of circulating:  Proteins.  Vitamins.  Minerals.  These techniques tend to be imprecise. For many nutrients, there is no clear "cut-off" point that would allow an accurate definition of deficiency. So assessing the nutritional status of alcoholics is limited by these techniques. Dietary status may provide information about the risk of developing nutritional problems. Dietary status is assessed by:  Taking patients' dietary histories.  Evaluating the amount and types of food they are eating.  It is difficult to determine what exact amount of alcohol begins to have damaging effects on nutrition. In general, moderate drinkers have 2 drinks or less per day. They seem to be at little risk for nutritional problems. Various medical disorders begin to appear at greater levels.  Research indicates that the majority of even the heaviest drinkers have few obvious nutritional deficiencies. Many alcoholics who are  hospitalized for medical complications of their disease do have severe malnutrition. Alcoholics tend to eat poorly. Often they eat less than the amounts of food necessary to provide enough:  Carbohydrates.  Protein.  Fat.  Vitamins A and C.  B vitamins.  Minerals like calcium and iron. Of major concern is alcohol's effect on digesting food and use of nutrients. It may shift a mildly malnourished person toward severe malnutrition. Document Released: 11/11/2004 Document Revised: 04/11/2011 Document Reviewed: 04/27/2005 [facility] Patient Information 2014 [place].  Alcohol Problems Most adults who drink alcohol drink in moderation (not a lot) are at low risk for developing problems related to their drinking. However, all drinkers, including low-risk drinkers, should know about the health risks connected with drinking alcohol. RECOMMENDATIONS FOR LOW-RISK DRINKING  Drink in moderation. Moderate drinking is defined as follows:   Men - no more than 2 drinks per day.  Nonpregnant women - no more than 1 drink per day.  Over age 10 - no more than 1 drink per day. A standard drink is 12 grams of pure alcohol, which is equal to a 12 ounce bottle of beer or wine cooler, a 5 ounce glass of wine, or 1.5 ounces of distilled spirits (such as whiskey, brandy, vodka, or rum).  ABSTAIN FROM (DO NOT DRINK) ALCOHOL:  When pregnant or considering pregnancy.  When taking a medication that interacts with alcohol.  If you are alcohol dependent.  A medical condition that prohibits drinking alcohol (such as ulcer, liver disease, or heart disease). DISCUSS WITH YOUR CAREGIVER:  If you are at risk for coronary heart disease, discuss the potential benefits and risks of alcohol use: Light to moderate drinking is associated with lower rates of coronary heart disease in certain populations (for example, men over age 24 and postmenopausal women). Infrequent or nondrinkers are advised not to begin light to  moderate drinking to reduce the risk of coronary heart disease so as to avoid creating an alcohol-related problem. Similar protective effects can likely be gained through proper diet and exercise.  Women and the elderly have smaller amounts of body water than men. As a result women and the elderly achieve a higher blood alcohol concentration after drinking  the same amount of alcohol.  Exposing a fetus to alcohol can cause a broad range of birth defects referred to as Fetal Alcohol Syndrome (FAS) or Alcohol-Related Birth Defects (ARBD). Although FAS/ARBD is connected with excessive alcohol consumption during pregnancy, studies also have reported neurobehavioral problems in infants born to mothers reporting drinking an average of 1 drink per day during pregnancy.  Heavier drinking (the consumption of more than 4 drinks per occasion by men and more than 3 drinks per occasion by women) impairs learning (cognitive) and psychomotor functions and increases the risk of alcohol-related problems, including accidents and injuries. CAGE QUESTIONS:   Have you ever felt that you should Cut down on your drinking?  Have people Annoyed you by criticizing your drinking?  Have you ever felt bad or Guilty about your drinking?  Have you ever had a drink first thing in the morning to steady your nerves or get rid of a hangover (Eye opener)? If you answered positively to any of these questions: You may be at risk for alcohol-related problems if alcohol consumption is:   Men: Greater than 14 drinks per week or more than 4 drinks per occasion.  Women: Greater than 7 drinks per week or more than 3 drinks per occasion. Do you or your family have a medical history of alcohol-related problems, such as:  Blackouts.  Sexual dysfunction.  Depression.  Trauma.  Liver dysfunction.  Sleep disorders.  Hypertension.  Chronic abdominal pain.  Has your drinking ever caused you problems, such as problems with your  family, problems with your work (or school) performance, or accidents/injuries?  Do you have a compulsion to drink or a preoccupation with drinking?  Do you have poor control or are you unable to stop drinking once you have started?  Do you have to drink to avoid withdrawal symptoms?  Do you have problems with withdrawal such as tremors, nausea, sweats, or mood disturbances?  Does it take more alcohol than in the past to get you high?  Do you feel a strong urge to drink?  Do you change your plans so that you can have a drink?  Do you ever drink in the morning to relieve the shakes or a hangover? If you have answered a number of the previous questions positively, it may be time for you to talk to your caregivers, family, and friends and see if they think you have a problem. Alcoholism is a chemical dependency that keeps getting worse and will eventually destroy your health and relationships. Many alcoholics end up dead, impoverished, or in prison. This is often the end result of all chemical dependency.  Do not be discouraged if you are not ready to take action immediately.  Decisions to change behavior often involve up and down desires to change and feeling like you cannot decide.  Try to think more seriously about your drinking behavior.  Think of the reasons to quit. WHERE TO GO FOR ADDITIONAL INFORMATION   [employer] [place] on Alcohol Abuse and Alcoholism (NIAAA) http://www.bradshaw.com/  [employer] on Alcoholism and Drug Dependence (NCADD) www.ncadd.[place] (ASAM) http://carpenter.net/  Document Released: 01/17/2005 Document Revised: 04/11/2011 Document Reviewed: 09/05/2007 [name] Patient Information 2014 [place]. Secondhand Smoke Secondhand smoke is the smoke exhaled by smokers and the smoke given off by a burning cigarette, cigar, or pipe. When a cigarette is smoked, about half of the smoke is inhaled and exhaled by the smoker, and  the other half floats around in the  air. Exposure to secondhand smoke is also called involuntary smoking or passive smoking. People can be exposed to secondhand smoke in:   Homes.  Cars.  Workplaces.  Public places (bars, restaurants, other recreation sites). Exposure to secondhand smoke is hazardous.It contains more than 250 harmful chemicals, including at least 60 that can cause cancer. These chemicals include:  Arsenic, a heavy metal toxin.  Benzene, a chemical found in gasoline.  Beryllium, a toxic metal.  Cadmium, a metal used in batteries.  Chromium, a metallic element.  Ethylene oxide, a chemical used to sterilize medical devices.  Nickel, a metallic element.  Polonium 210, a chemical element that gives off radiation.  Vinyl chloride, a toxic substance used in the Social research officer, government. Nonsmoking spouses and family members of smokers have higher rates of cancer, heart disease, and serious respiratory illnesses than those not exposed to secondhand smoke.  Nicotine, a nicotine by-product called cotinine, carbon monoxide, and other evidence of secondhand smoke exposure have been found in the body fluids of nonsmokers exposed to secondhand smoke.  Living with a smoker may increase a nonsmoker's chances of developing lung cancer by 20 to 30 percent.  Secondhand smoke may increase the risk of breast cancer, nasal sinus cavity cancer, cervical cancer, bladder cancer, and nose and throat (nasopharyngeal) cancer in adults.  Secondhand smoke may increase the risk of heart disease by 25 to 30 percent. Children are especially at risk from secondhand smoke exposure. Children of smokers have higher rates of:  Pneumonia.  Asthma.  Smoking.  Bronchitis.  Colds.  Chronic cough.  Ear infections.  Tonsilitis.  School absences. Research suggests that exposure to secondhand smoke may cause leukemia, lymphoma, and brain tumors in children. Babies are three times more  likely to die from sudden infant death syndrome (SIDS) if their mothers smoked during and after pregnancy. There is no safe level of exposure to secondhand smoke. Studies have shown that even low levels of exposure can be harmful. The only way to fully protect nonsmokers from secondhand smoke exposure is to completely eliminate smoking in indoor spaces. The best thing you can do for your own health and for your children's health is to stop smoking. You should stop as soon as possible. This is not easy, and you may fail several times at quitting before you get free of this addiction. Nicotine replacement therapy ( such as patches, gum, or lozenges) can help. These therapies can help you deal with the physical symptoms of withdrawal. Attending quit-smoking support groups can help you deal with the emotional issues of quitting smoking.  Even if you are not ready to quit right now, there are some simple changes you can make to reduce the effect of your smoking on your family:  Do not smoke in your home. Smoke away from your home in an open area, preferably outside.  Ask others to not smoke in your home.  Do not smoke while holding a child or when children are near.  Do not smoke in your car.  Avoid restaurants, day care centers, and other places that allow smoking. Document Released: 02/25/2004 Document Revised: 10/12/2011 Document Reviewed: 10/29/2008 [facility] Patient Information 2014 [place], [place]. Sickle Cell Anemia, Adult Sickle cell anemia is a condition where your red blood cells are shaped like sickles. Red blood cells carry oxygen through the body. Sickle-shaped red blood cells cells do not live as long as normal red blood cells. They also clump together and block blood from flowing through the blood vessels. These  things prevent the body from getting enough oxygen. Sickle cell anemia causes organ damage and pain. It also increases the risk of infection. HOME CARE  Drink enough fluid to keep  your pee (urine) clear or pale yellow. Drink more in hot weather and during exercise.  Do not smoke. Smoking lowers oxygen levels in the blood.  Only take over-the-counter or prescription medicines as told by your doctor.  Take antibiotic medicines as told by your doctor. Make sure you finish them it even if you start to feel better.  Take supplements as told by your doctor.  Consider wearing a medical alert bracelet. This tells anyone caring for you in an emergency of your condition.  When traveling, keep your medical information, doctors' names, and the medicines you take with you at all times.  If you have a fever, do not take fever medicines right away. This could cover up a problem. Tell your doctor.   Keep all follow-up appointments with your doctor. Sickle cell anemia requires regular medical care. GET HELP IF: You have a fever. GET HELP RIGHT AWAY IF:  You feel dizzy or faint.  You have new belly (abdominal) pain, especially on the left side near the stomach area.  You have a lasting, often uncomfortable and painful erection of the penis (priapism). If it is not treated right away, you will become unable to have sex (impotence).  You have numbness your arms or legs or you have a hard time moving them.  You have a hard time talking.  You have a fever or lasting symptoms for more than 2 3 days.  You have a fever and your symptoms suddenly get worse.  You have signs or symptoms of infection. These include:  Chills.  Being more tired than normal (lethargy).  Irritability.  Poor eating.  Throwing up (vomiting).  You have pain that is not helped with medicine.  You have shortness of breath.  You have pain in your chest.  You are coughing up pus-like or bloody mucus.  You have a stiff neck.  Your feet or hands swell or have pain.  Your belly looks bloated.  Your joints hurt. MAKE SURE YOU:  Understand these instructions.  Will watch your  condition.  Will get help right away if you are not doing well or get worse. Document Released: 11/07/2012 Document Reviewed: 08/29/2012 [facility] Patient Information 2014 [place].

## 2013-05-20 NOTE — Discharge Summary (Addendum)
PHYSICIAN DISCHARGE SUMMARY   [name] [id number] DOB: 1980/09/30 DOA: 05/13/2013  PCP: [name],[name], MD  Admit date: 05/13/2013 Discharge date: 05/20/2013  Time spent: 40 minutes  Recommendations for Outpatient Follow-up:  1. Please consider rechecking CBC and CMP on outpatient followup  2. Discuss with patient about restarting Exjade 3. 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 He was initiated on IV Dilaudid PCA and Toradol as well as supportive care. He continued to have more pain rated as 8/10 not getting any better. Also having severe itching the pain was not relieved by medication and was exacerbated by any movement. He has some shortness of breath but denies cough. He was recently admitted into the hospital which time he had acute chest syndrome. He has apparently been sick for 2 weeks but decided not to come in because he was waiting for his birthday which was this past weekend. He planned and as executed a party at the time. He has been drinking excessively recently including this past weekend where he drank vodka. Overall these apparently have  made him have more pain. He's been taking all his medications at home although objective findings have shown he is not taking his hydroxyurea. Patient was evaluated now and had oxygen saturations between 80-90% on room air, and has chest x-ray indicated acute chest syndrome hence he is being admitted to the hospital.  Hospital Course:  1. Acute Chest Syndrome: Pt has had 2nd episode of acute chest this year. Pain is better controlled now and patient is reporting that he feels that he can manage his chronic pain at home with his regular home pain medications.  He is weaned to room air.  Continue oral levaquin to complete an 8 day course of treatment.  Encouraged continued use of incentive spirometry.  CT angio negative for PE and EKG/ troponin no acute findings. Encouraged ambulation.  2. Hb SS with Crisis: Pt is reporting better pain control today. Pt feels he can manage chronic pain at home now.   3. Leukocytosis: Chronic, slightly improved, Likely secondary to crisis.  4. Hyperbilirubinemia (unconjugated) -  secondary to ongoing hemolysis in the setting of acute vaso-occlusive event, His bilirubin is now trending down.  He is not having severe symptoms of pruritus.   5. Severe Vit D Deficiency - Pt's vit D level is 10, Continue Drisdol 50,000 IU caps as ordered. Follow outpatient. 6. ETOH consumption: Pt has been drinking ETOH on weekends. He has exhibited no signs of withdrawal.  This has been addressed with patient and will be further addressed outpatient with PCP. He is willing to  cut down on drinking now that he is back with his girlfriend. 7. 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 No evidence of active bleeding. CT angio of chest negative for PE.  8. Cardiomegaly: this is a chronic finding, given cxr findings of pulmonary edema and further cardiomegaly. ECHO reveals normal pump function but grade 1 diastolic dysfunction. (see full ECHO report). Recommended further discussion about this  with PCP on oupatient followup.  The patient verbalized understanding.   9. Chronic Opiate dependence: Pt opiate tolerant and on MS Contin for treatment of chronic pain. 10. Sickle Cell Disease: Continue Hydrea and Folic Acid. 11. Chronic Hypokalemia - repleted and following. Continue supplementation.  12. 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 He has no particular reason. He is aware of the risk of Iron overload to his organs up to and including death.  Code Status: Full Code  Family Communication: counseled patient at bedside  Disposition: Home   Procedures: See below  Discharge Exam: Pt awake, alert, no apparent distress, texting on his phone in bed, says he feels he can manage his chronic pain at home with his home pain meds, no chest pain and no shortness of breath, no itching. No fever or chills reported.   Filed Vitals:   05/20/13 0722  BP:   Pulse:   Temp:   Resp: 14   General: chronically ill appearing, lethargic but easily arousable, oriented x3, in no acute distress.  HEENT: [place]/AT PEERL, EOMI, bilateral scleral icterus unchanged  OROPHARYNX: Pale mucosa. Moist, No exudate/ erythema/lesions.  Heart: Regular rate and rhythm, without murmurs, rubs, gallops.  Lungs: bilateral BS clear, no wheezing or rhonchi noted.  Abdomen: Soft, nontender, nondistended, positive bowel sounds, no masses no hepatosplenomegaly noted.  Neuro: No focal neurological deficits noted cranial nerves II through XII grossly intact. Strength normal in bilateral upper and lower extremities.  Musculoskeletal: No warmth, swelling or erythema around joints, lower back tenderness to palpation noted.  Psychiatric: Patient alert and oriented x3, good insight and cognition, good recent to remote recall.  Discharge Instructions  Discharge Orders   Future Orders Complete By Expires   Diet - low sodium heart healthy  As directed    Discharge instructions  As directed    Increase activity slowly   As directed        Medication List         celecoxib 200 MG capsule  Commonly known as:  CELEBREX  Take 1 capsule (200 mg total) by mouth 2 (two) times daily.     ergocalciferol 50000 UNITS capsule  Commonly known as:  VITAMIN D2  Take 50,000 Units by mouth once a week. On Tuesday     folic acid 1 MG tablet  Commonly known as:  FOLVITE  Take 1 tablet (1 mg total) by mouth every morning.     HYDROmorphone 4 MG tablet  Commonly known as:  DILAUDID  Take 1 tablet (4 mg total) by mouth every 4 (four) hours as needed for severe pain.     hydroxyurea 500 MG capsule  Commonly known as:  HYDREA  Take 1 capsule (500 mg total) by mouth 2 (two) times daily with a meal. May take with food to minimize GI side effects.     levofloxacin 750 MG tablet  Commonly known as:  LEVAQUIN  Take 1 tablet (750 mg total) by mouth daily.     morphine 15 MG 12 hr tablet  Commonly known as:  MS CONTIN  Take  1 tablet (15 mg total) by mouth 2 (two) times daily.     potassium chloride SA 20 MEQ tablet  Commonly known as:  K-DUR,KLOR-CON  Take 1 tablet (20 mEq total) by mouth every morning.     XARELTO 20 MG Tabs tablet  Generic drug:  rivaroxaban  Take 20 mg by mouth daily with supper.     zolpidem 10 MG tablet  Commonly known as:  AMBIEN  Take 1 tablet (10 mg total) by mouth at bedtime as needed for sleep.       Allergies  Allergen Reactions  . Morphine And Related Hives and Rash    Pt states he can take PO, but not IV and he is able to tolerate Dilaudid with no reactions.       Follow-up Information   Follow up with [name],[name], MD. Schedule an appointment as soon as possible for a visit in 1 week. [facility] Follow Up)    Specialty:  Internal Medicine   Contact information:   [address] [place] [address] [phone]      The results of significant diagnostics from this hospitalization (including imaging, microbiology, ancillary and laboratory) are listed  below for reference.    Significant Diagnostic Studies:  2D-ECHOCARDIOGRAM  Study Conclusions - Left ventricle: The cavity size was normal. Wall thickness was increased in a pattern of mild LVH. Systolic function was normal. The estimated ejection fraction was in the range of 60% to 65%. Wall motion was normal; there were no regional wall motion abnormalities. Doppler parameters are consistent with abnormal left ventricular relaxation (grade 1 diastolic dysfunction). - Right ventricle: The cavity size was moderately dilated. - Right atrium: The atrium was moderately dilated. - Tricuspid valve: Severe regurgitation. - Pulmonary arteries: PA peak pressure: 78mm Hg (S). Transthoracic echocardiography. M-mode, complete 2D, spectral Doppler, and color Doppler. Height: Height: 182.9cm. Height: 72in. Weight: Weight: 77kg. Weight: 169.4lb. Body mass index: BMI: 23kg/m^2. Body surface area: BSA: 1.23m^2. Blood pressure: 134/74. Patient status: Inpatient. Location: Bedside.  Dg Chest 2 View  05/14/2013   CLINICAL DATA:  Acute chest syndrome.  EXAM: CHEST  2 VIEW  COMPARISON:  05/13/2013  FINDINGS: Heart is enlarged. Patient also sided power port, tip overlying the level of the lower superior vena cava. There mild perihilar changes of pulmonary edema. No focal consolidations or pleural effusions are identified.  IMPRESSION: Cardiomegaly and mild pulmonary edema.   Electronically Signed   By: Shon Hale M.D.   On: 05/14/2013 15:28   Dg Chest 2 View  05/13/2013   CLINICAL DATA:  Weakness, fatigue and body aches. Sickle cell disease.  EXAM: CHEST  2 VIEW  COMPARISON:  Chest x-ray 04/09/2013.  FINDINGS: Mild diffuse interstitial prominence and patchy airspace opacities, most notably in the right lower lobe, concerning for acute chest syndrome in this patient history of sickle cell disease. No pleural effusion. No definite evidence of pulmonary edema. Heart size remains mildly enlarged. Upper mediastinal  contours are within normal limits. Left subclavian single-lumen power porta cath with tip terminating at the superior cavoatrial junction. Surgical clips project over the right upper quadrant of the abdomen, compatible with prior cholecystectomy.  IMPRESSION: 1. Interval development of interstitial and patchy airspace opacities concerning for acute chest syndrome in this patient with history of sickle cell disease. Clinical correlation is recommended. 2. Mild cardiomegaly (unchanged).   Electronically Signed   By: Vinnie Langton M.D.   On: 05/13/2013 16:46   Ct Angio Chest Pe  W/cm &/or Wo Cm  05/18/2013   CLINICAL DATA:  Sickle-cell crisis with chest pain. Evaluate for pulmonary embolus.  EXAM: CT ANGIOGRAPHY CHEST WITH CONTRAST  TECHNIQUE: Multidetector CT imaging of the chest was performed using the standard protocol during bolus administration of intravenous contrast. Multiplanar CT image reconstructions and MIPs were obtained to evaluate the vascular anatomy.  CONTRAST:  OMNIPAQUE IOHEXOL 350 MG/ML SOLN  COMPARISON:  Chest x-ray 05/14/2013.  CT chest 03/26/2013.  FINDINGS: Mediastinum: No filling defects are noted within the pulmonary arterial tree to suggest pulmonary emboli. Heart size is increased. No pericardial fluid, thickening or calcification. No acute abnormality of the thoracic aorta or other great vessels of the mediastinum. Mild right hilar and mediastinal lymph node prominence, stable. The esophagus is normal in appearance. Layering mucus within the proximal right mainstem bronchus. Stable Port-A-Cath.  Lungs/Pleura: No pleural effusions. No suspicious appearing pulmonary nodules or masses. Peripheral areas of ground-glass opacity in both lungs, likely representing a acute chest syndrome. Small vessel infarction with hemorrhagic edema not excluded. No definite bronchopneumonia.  Upper Abdomen: Visualized portions of the upper abdomen are unremarkable. Dense calcification of the spleen  consistent with auto infarction is redemonstrated.  Musculoskeletal: No aggressive appearing lytic or blastic lesions are noted in the visualized portions of the skeleton. Sickle cell endplate infarctions.  Review of the MIP images confirms the above findings.  IMPRESSION: No pulmonary embolus. Stable peripheral ground-glass opacities, without definite bronchopneumonia.   Electronically Signed   By: Davonna Belling M.D.   On: 05/18/2013 09:16   Microbiology: Recent Results (from the past 240 hour(s))  MRSA PCR SCREENING     Status: Abnormal   Collection Time    05/13/13  6:00 PM      Result Value Ref Range Status   MRSA by PCR POSITIVE (*) NEGATIVE Final   Comment:            The GeneXpert MRSA Assay (FDA     approved for NASAL specimens     only), is one component of a     comprehensive MRSA colonization     surveillance program. It is not     intended to diagnose MRSA     infection nor to guide or     monitor treatment for     MRSA infections.     RESULT CALLED TO, READ BACK BY AND VERIFIED WITH:     C.DENNY,RN AT 2320 ON 05/13/13 BY W.SHEA  CULTURE, BLOOD (ROUTINE X 2)     Status: None   Collection Time    05/13/13  8:35 PM      Result Value Ref Range Status   Specimen Description BLOOD RIGHT ARM   Final   Special Requests BOTTLES DRAWN AEROBIC AND ANAEROBIC   Final   Culture  Setup Time     Final   Value: 05/14/2013 01:08     Performed at [facility]   Culture     Final   Value: NO GROWTH 5 DAYS     Performed at [facility]   Report Status 05/20/2013 FINAL   Final  CULTURE, BLOOD (ROUTINE X 2)     Status: None   Collection Time    05/13/13  8:40 PM      Result Value Ref Range Status   Specimen Description BLOOD RIGHT HAND   Final   Special Requests BOTTLES DRAWN AEROBIC AND ANAEROBIC   Final   Culture  Setup Time  Final   Value: 05/14/2013 01:08     Performed at [facility]   Culture     Final   Value: NO GROWTH 5 DAYS     Performed  at [facility]   Report Status 05/20/2013 FINAL   Final     Labs: Basic Metabolic Panel:  Recent Labs Lab 05/16/13 0540 05/17/13 0545 05/18/13 0615 05/19/13 0615 05/20/13 0615  NA 137 137 140 136* 139  K 4.4 4.8 4.8 4.2 4.0  CL 103 103 106 102 103  CO2 21 22 20 21 24   GLUCOSE 112* 130* 117* 125* 124*  BUN 8 13 16 12 8   CREATININE 0.56 0.66 0.63 0.68 0.51  CALCIUM 9.1 9.2 8.8 9.0 9.2   Liver Function Tests:  Recent Labs Lab 05/16/13 0540 05/17/13 0545 05/18/13 0615 05/19/13 0615 05/19/13 0930 05/20/13 0615  AST 47* 50* 71* 91*  --  78*  ALT 24 23 32 39  --  38  ALKPHOS 105 90 99 101  --  92  BILITOT 5.0* 6.8* 7.6* 11.2* 11.0* 9.1*  PROT 7.8 7.7 8.1 8.2  --  7.4  ALBUMIN 4.4 4.3 4.4 4.6  --  4.0   No results found for this basename: LIPASE, AMYLASE,  in the last 168 hours No results found for this basename: AMMONIA,  in the last 168 hours CBC:  Recent Labs Lab 05/13/13 0900  05/14/13 0545 05/15/13 0500 05/16/13 0540 05/17/13 0545 05/18/13 0615 05/19/13 0615 05/20/13 0615  WBC 19.9*  < > 23.6* 20.2* 20.7* 20.7* 23.8* 20.6* 14.3*  NEUTROABS 14.7*  --  16.5* 13.0* 13.7* 15.9*  --   --   --   HGB 8.3*  < > 7.9* 7.9* 8.3* 7.5* 7.2* 7.3* 7.1*  HCT 24.7*  < > 23.3* 22.6* 23.3* 20.1* 19.8* 19.9* 19.5*  MCV 93.9  < > 93.6 90.0 85.7 82.7 82.8 82.2 84.4  PLT 473*  < > 464* 526* 474* 482* 485* 527* 534*  < > = values in this interval not displayed. Cardiac Enzymes:  Recent Labs Lab 05/18/13 1105 05/19/13 0615  CKTOTAL 85 84  TROPONINI <0.30  --    BNP: BNP (last 3 results)  Recent Labs  11/13/12 1826  PROBNP 106.3   CBG: No results found for this basename: GLUCAP,  in the last 168 hours  Signed:  [facility] 05/20/2013, 8:19 AM

## 2013-05-23 ENCOUNTER — Ambulatory Visit (INDEPENDENT_AMBULATORY_CARE_PROVIDER_SITE_OTHER): Payer: Medicare Other | Admitting: Family Medicine

## 2013-05-23 ENCOUNTER — Telehealth: Payer: Self-pay

## 2013-05-23 ENCOUNTER — Encounter: Payer: Self-pay | Admitting: Family Medicine

## 2013-05-23 VITALS — BP 101/71 | HR 81 | Temp 98.0°F | Resp 20 | Wt 169.0 lb

## 2013-05-23 DIAGNOSIS — D571 Sickle-cell disease without crisis: Secondary | ICD-10-CM

## 2013-05-23 DIAGNOSIS — D5700 Hb-SS disease with crisis, unspecified: Secondary | ICD-10-CM

## 2013-05-23 MED ORDER — HYDROMORPHONE HCL 4 MG PO TABS: 4.0000 mg | ORAL_TABLET | ORAL | Status: DC | PRN

## 2013-05-23 MED ORDER — MORPHINE SULFATE ER 15 MG PO TBCR: 15.0000 mg | EXTENDED_RELEASE_TABLET | Freq: Two times a day (BID) | ORAL | Status: DC

## 2013-05-23 NOTE — Telephone Encounter (Signed)
Pt was contacted about his up [place] w/ [facility].Pt will be seen on 06/04/2013@10 :30 am w/Dr. Rosana Hoes.Pt was told he would receve a call from [place] as a reminder as well.

## 2013-05-23 NOTE — Progress Notes (Signed)
Subjective:    Patient ID: [name], male    DOB: Apr 24, 1980, 33 y.o.   MRN: [id number]  HPI Patient with a history of sickle cell anemia hemoglobin SS presents for his 3 month follow-up Patient complaining of pain to lower back and flanks. Pain is consistent with chronic sickle cell pain. Maintains that current pain intensity is 6/10 described as constant and throbbing. Patient reports that he is taking MS Contin with minimal relief and is currently out of  Short acting pain medication.    Review of Systems  Constitutional: Negative.   HENT: Negative.   Eyes: Negative.   Respiratory: Negative.   Cardiovascular: Negative.   Gastrointestinal: Negative.   Endocrine: Negative.   Genitourinary: Negative.   Musculoskeletal: Positive for back pain and myalgias.  Skin: Negative.   Allergic/Immunologic: Negative.   Neurological: Negative.   Hematological: Negative.   Psychiatric/Behavioral: Negative.        Objective:   Physical Exam  Constitutional: He is oriented to person, place, and time. He appears well-developed and well-nourished.  HENT:  Head: Normocephalic and atraumatic.  Eyes: Conjunctivae are normal. Pupils are equal, round, and reactive to light. Scleral icterus is present.  Neck: Normal range of motion. Neck supple.  Cardiovascular: Normal rate, regular rhythm and normal heart sounds.   Pulmonary/Chest: Effort normal and breath sounds normal.  Abdominal: Soft. Bowel sounds are normal.  Musculoskeletal:       Right shoulder: He exhibits pain. He exhibits normal range of motion, no tenderness and normal strength.  Neurological: He is alert and oriented to person, place, and time.  Skin: Skin is warm and dry.          Assessment & Plan:  1. Sickle cell disease - Continue Hydrea 500 mg twice daily. We discussed the need for good hydration, monitoring of hydration status, avoidance of heat, cold, stress, and infection triggers. We discussed the risks and  benefits of Hydrea, including bone marrow suppression, the possibility of GI upset, skin ulcers, hair thinning, and teratogenicity. The patient was reminded of the need to seek medical attention of any symptoms of bleeding, anemia, or infection. Continue folic acid 1 mg daily to prevent aplastic bone marrow crises. Reviewed laboratory results from 05/20/2013.   2. Cardiac - Routine screening for pulmonary hypertension is not recommended.  3. Eye - High risk of proliferative retinopathy. Annual eye exam with retinal exam recommended to patient. Referred to Baldemar Lenis, opthalmologist  4. Immunization status - He declines vaccines today. Yearly influenza vaccination is recommended, as well as being up to date with Meningococcal and Pneumococcal vaccines.   5. Acute and chronic painful episodes - We discussed that he is to receive his Schedule II prescriptions only from [place].  He is also aware that his prescription history is available to [place] online through the [place]. Controlled substance agreement signed. We reminded him that all patients receiving Schedule II narcotics must be seen for follow up every three months. We reviewed the terms of our pain agreement, including the need to keep medicines in a safe locked location away from children or pets, and the need to report excess sedation or constipation, measures to avoid constipation, and policies related to early refills and stolen prescriptions. According to the [place] [facility] program, we have reviewed details related to analgesia, adverse effects, aberrant behaviors. Reordered prescriptions for Dilaudid 4 mg every 4 hours prn #90 and MS contin every 12 hours daily # 60.   6.  Vitamin D deficiency - Drisdol 50,000 units weekly, we encouraged her to take it.   The above recommendations are taken from the NIH Evidence-Based Management of Sickle Cell Disease: Expert Panel Report, 20149.    RTC: 3 months Dorena Dew, FNP

## 2013-05-28 ENCOUNTER — Non-Acute Institutional Stay (HOSPITAL_COMMUNITY)
Admission: AD | Admit: 2013-05-28 | Discharge: 2013-05-28 | Disposition: A | Discharge status: Home / Self Care | Payer: Medicare Other | Attending: Internal Medicine | Admitting: Internal Medicine

## 2013-05-28 ENCOUNTER — Telehealth (HOSPITAL_COMMUNITY): Payer: Self-pay | Admitting: Hematology

## 2013-05-28 ENCOUNTER — Encounter (HOSPITAL_COMMUNITY): Payer: Self-pay | Admitting: Hematology

## 2013-05-28 DIAGNOSIS — I10 Essential (primary) hypertension: Secondary | ICD-10-CM | POA: Insufficient documentation

## 2013-05-28 DIAGNOSIS — R079 Chest pain, unspecified: Secondary | ICD-10-CM | POA: Insufficient documentation

## 2013-05-28 DIAGNOSIS — M79609 Pain in unspecified limb: Secondary | ICD-10-CM | POA: Diagnosis not present

## 2013-05-28 DIAGNOSIS — Z86718 Personal history of other venous thrombosis and embolism: Secondary | ICD-10-CM | POA: Diagnosis not present

## 2013-05-28 DIAGNOSIS — E860 Dehydration: Secondary | ICD-10-CM | POA: Insufficient documentation

## 2013-05-28 DIAGNOSIS — R1110 Vomiting, unspecified: Secondary | ICD-10-CM | POA: Insufficient documentation

## 2013-05-28 DIAGNOSIS — Z96649 Presence of unspecified artificial hip joint: Secondary | ICD-10-CM | POA: Diagnosis not present

## 2013-05-28 DIAGNOSIS — D571 Sickle-cell disease without crisis: Secondary | ICD-10-CM | POA: Insufficient documentation

## 2013-05-28 DIAGNOSIS — Z87891 Personal history of nicotine dependence: Secondary | ICD-10-CM | POA: Diagnosis not present

## 2013-05-28 DIAGNOSIS — Z9089 Acquired absence of other organs: Secondary | ICD-10-CM | POA: Diagnosis not present

## 2013-05-28 DIAGNOSIS — Z7901 Long term (current) use of anticoagulants: Secondary | ICD-10-CM | POA: Diagnosis not present

## 2013-05-28 DIAGNOSIS — Z79899 Other long term (current) drug therapy: Secondary | ICD-10-CM | POA: Diagnosis not present

## 2013-05-28 LAB — CBC WITH DIFFERENTIAL/PLATELET
BASOS ABS: 0.2 10*3/uL — AB (ref 0.0–0.1)
Basophils Relative: 1 % (ref 0–1)
EOS ABS: 0.2 10*3/uL (ref 0.0–0.7)
Eosinophils Relative: 1 % (ref 0–5)
HEMATOCRIT: 25.1 % — AB (ref 39.0–52.0)
Hemoglobin: 8.7 g/dL — ABNORMAL LOW (ref 13.0–17.0)
LYMPHS ABS: 1.6 10*3/uL (ref 0.7–4.0)
Lymphocytes Relative: 8 % — ABNORMAL LOW (ref 12–46)
MCH: 32.2 pg (ref 26.0–34.0)
MCHC: 34.7 g/dL (ref 30.0–36.0)
MCV: 93 fL (ref 78.0–100.0)
MONO ABS: 1.8 10*3/uL — AB (ref 0.1–1.0)
Monocytes Relative: 9 % (ref 3–12)
NEUTROS ABS: 16.6 10*3/uL — AB (ref 1.7–7.7)
Neutrophils Relative %: 81 % — ABNORMAL HIGH (ref 43–77)
PLATELETS: 626 10*3/uL — AB (ref 150–400)
RBC: 2.7 MIL/uL — ABNORMAL LOW (ref 4.22–5.81)
RDW: 21.1 % — AB (ref 11.5–15.5)
WBC: 20.4 10*3/uL — ABNORMAL HIGH (ref 4.0–10.5)

## 2013-05-28 LAB — RETICULOCYTES
RBC.: 2.7 MIL/uL — ABNORMAL LOW (ref 4.22–5.81)
Retic Count, Absolute: 378 10*3/uL — ABNORMAL HIGH (ref 19.0–186.0)
Retic Ct Pct: 14 % — ABNORMAL HIGH (ref 0.4–3.1)

## 2013-05-28 MED ORDER — HYDROMORPHONE HCL PF 2 MG/ML IJ SOLN
1.9000 mg | Freq: Once | INTRAMUSCULAR | Status: AC
  Administered 2013-05-28: 1.9 mg via INTRAVENOUS
  Filled 2013-05-28: qty 1

## 2013-05-28 MED ORDER — HYDROXYUREA 500 MG PO CAPS
1000.0000 mg | ORAL_CAPSULE | Freq: Every day | ORAL | Status: DC
  Administered 2013-05-28: 1000 mg via ORAL
  Filled 2013-05-28: qty 2

## 2013-05-28 MED ORDER — PROMETHAZINE HCL 25 MG/ML IJ SOLN
25.0000 mg | Freq: Four times a day (QID) | INTRAMUSCULAR | Status: DC | PRN
  Administered 2013-05-28: 25 mg via INTRAVENOUS
  Filled 2013-05-28: qty 1

## 2013-05-28 MED ORDER — DEXTROSE-NACL 5-0.45 % IV SOLN
INTRAVENOUS | Status: DC
  Administered 2013-05-28: 09:00:00 via INTRAVENOUS

## 2013-05-28 MED ORDER — SODIUM CHLORIDE 0.9 % IJ SOLN
10.0000 mL | INTRAMUSCULAR | Status: AC | PRN
  Administered 2013-05-28: 10 mL

## 2013-05-28 MED ORDER — HYDROMORPHONE HCL PF 2 MG/ML IJ SOLN
1.5000 mg | Freq: Once | INTRAMUSCULAR | Status: AC
Start: 2013-05-28 — End: 2013-05-28
  Administered 2013-05-28: 1.5 mg via INTRAVENOUS
  Filled 2013-05-28 (×2): qty 1

## 2013-05-28 MED ORDER — SODIUM CHLORIDE 0.9 % IJ SOLN: 9.0000 mL | INTRAMUSCULAR | Status: DC | PRN

## 2013-05-28 MED ORDER — KETOROLAC TROMETHAMINE 30 MG/ML IJ SOLN
30.0000 mg | Freq: Four times a day (QID) | INTRAMUSCULAR | Status: DC
  Administered 2013-05-28: 30 mg via INTRAVENOUS
  Filled 2013-05-28: qty 1

## 2013-05-28 MED ORDER — VITAMIN D (ERGOCALCIFEROL) 1.25 MG (50000 UNIT) PO CAPS
50000.0000 [IU] | ORAL_CAPSULE | ORAL | Status: DC
  Administered 2013-05-28: 50000 [IU] via ORAL
  Filled 2013-05-28: qty 1

## 2013-05-28 MED ORDER — NALOXONE HCL 0.4 MG/ML IJ SOLN: 0.4000 mg | INTRAMUSCULAR | Status: DC | PRN

## 2013-05-28 MED ORDER — DIPHENHYDRAMINE HCL 25 MG PO CAPS: 25.0000 mg | ORAL_CAPSULE | Freq: Four times a day (QID) | ORAL | Status: DC | PRN

## 2013-05-28 MED ORDER — ONDANSETRON HCL 4 MG PO TABS: 4.0000 mg | ORAL_TABLET | ORAL | Status: DC | PRN

## 2013-05-28 MED ORDER — ONDANSETRON HCL 4 MG/2ML IJ SOLN: 4.0000 mg | Freq: Four times a day (QID) | INTRAMUSCULAR | Status: DC | PRN

## 2013-05-28 MED ORDER — HYDROMORPHONE HCL PF 2 MG/ML IJ SOLN
2.2000 mg | Freq: Once | INTRAMUSCULAR | Status: AC
  Administered 2013-05-28: 2.2 mg via INTRAVENOUS
  Filled 2013-05-28: qty 2

## 2013-05-28 MED ORDER — ONDANSETRON HCL 4 MG/2ML IJ SOLN: 4.0000 mg | INTRAMUSCULAR | Status: DC | PRN

## 2013-05-28 MED ORDER — HEPARIN SOD (PORK) LOCK FLUSH 100 UNIT/ML IV SOLN
500.0000 [IU] | INTRAVENOUS | Status: AC | PRN
  Administered 2013-05-28: 500 [IU]
  Filled 2013-05-28: qty 5

## 2013-05-28 MED ORDER — RIVAROXABAN 20 MG PO TABS
20.0000 mg | ORAL_TABLET | Freq: Every day | ORAL | Status: DC
Start: 2013-05-28 — End: 2013-05-28
  Administered 2013-05-28: 20 mg via ORAL
  Filled 2013-05-28: qty 1

## 2013-05-28 MED ORDER — POTASSIUM CHLORIDE CRYS ER 20 MEQ PO TBCR
20.0000 meq | EXTENDED_RELEASE_TABLET | Freq: Every morning | ORAL | Status: DC
  Administered 2013-05-28: 20 meq via ORAL
  Filled 2013-05-28: qty 1

## 2013-05-28 MED ORDER — PREGABALIN 50 MG PO CAPS
50.0000 mg | ORAL_CAPSULE | Freq: Every day | ORAL | Status: DC
  Administered 2013-05-28: 50 mg via ORAL
  Filled 2013-05-28: qty 1

## 2013-05-28 MED ORDER — ERGOCALCIFEROL 1.25 MG (50000 UT) PO CAPS: 50000.0000 [IU] | ORAL_CAPSULE | ORAL | Status: DC

## 2013-05-28 MED ORDER — DIPHENHYDRAMINE HCL 50 MG/ML IJ SOLN: 25.0000 mg | Freq: Four times a day (QID) | INTRAMUSCULAR | Status: DC | PRN

## 2013-05-28 MED ORDER — HYDROMORPHONE HCL 4 MG PO TABS
4.0000 mg | ORAL_TABLET | Freq: Once | ORAL | Status: AC
  Administered 2013-05-28: 4 mg via ORAL
  Filled 2013-05-28: qty 1

## 2013-05-28 MED ORDER — FOLIC ACID 1 MG PO TABS
1.0000 mg | ORAL_TABLET | Freq: Every morning | ORAL | Status: DC
  Administered 2013-05-28: 1 mg via ORAL
  Filled 2013-05-28: qty 1

## 2013-05-28 MED ORDER — HYDROMORPHONE 0.3 MG/ML IV SOLN
INTRAVENOUS | Status: DC
  Administered 2013-05-28 (×2): via INTRAVENOUS
  Administered 2013-05-28: 7.48 mg via INTRAVENOUS
  Administered 2013-05-28: 6.92 mg via INTRAVENOUS
  Filled 2013-05-28 (×2): qty 25

## 2013-05-28 NOTE — H&P (Signed)
[facility] History and Physical  INMER NIX KXF:[id number] DOB: February 05, 1980 DOA: 05/28/2013   PCP: [name],[name], MD   Chief Complaint: Pain in legs and ribs (left side) x 4 hours unaffected by oral medications  HPI: Pt with Hb SS presents with pain localized to thighs and left ribs which began 41/2 hours ago. Pt describes pain as sharp and non-radiating and associated with 2 episodes of emesis. Pt states pain is at intensity of 8/10 and normally has a baseline pain of 3-4/10.  Pt states that emesis is no bilious and non-hematematous.  He denies any cough, fever, chills, diarrhea of chest pain.    Review of Systems:  Constitutional: No weight loss, night sweats, Fevers, chills, fatigue.  HEENT: No headaches, dizziness, seizures, vision changes, difficulty swallowing,Tooth/dental problems,Sore throat, No sneezing, itching, ear ache, nasal congestion, post nasal drip,  Cardio-vascular: No chest pain, Orthopnea, PND, swelling in lower extremities, anasarca, dizziness, palpitations  GI: No heartburn, indigestion, abdominal pain, nausea, vomiting, diarrhea, change in bowel habits, loss of appetite  Resp: No shortness of breath with exertion or at rest. No excess mucus, no productive cough, No non-productive cough, No coughing up of blood.No change in color of mucus.No wheezing.No chest wall deformity  Skin: no rash or lesions.  GU: no dysuria, change in color of urine, no urgency or frequency. No flank pain.  Psych: No change in mood or affect. No depression or anxiety. No memory loss.    Past Medical History  Diagnosis Date  . Sickle cell anemia   . Blood transfusion   . 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 Hypokalemia   . Mood disorder   . History of pulmonary embolus (PE)   . Avascular necrosis   . Leukocytosis     Chronic  . Thrombocytosis     Chronic  . Hypertension   . History of Clostridium difficile infection   . Uses  marijuana   . Chronic anticoagulation   . Functional asplenia   . Former smoker   . Second hand tobacco smoke exposure   . Alcohol consumption of one to four drinks per day    Past Surgical History  Procedure Laterality Date  . Right hip replacement      08/2006  . Cholecystectomy      01/2008  . Porta cath placement    . Porta cath removal    . Umbilical hernia repair      01/2008  . Excision of left periauricular cyst      10/2009  . Excision of right ear lobe cyst with primary closur      11/2007  . Portacath placement  01/05/2012    Procedure: INSERTION PORT-A-CATH;  Surgeon: Odis Hollingshead, MD;  Location: [facility];  Service: General;  Laterality: N/A;  ultrasound guiced port a cath insertion with fluoroscopy   Social History:  reports that he quit smoking about 2 years ago. His smoking use included Cigarettes. He smoked 0.00 packs per day for 13 years. He has never used smokeless tobacco. He reports that he drinks about 6 ounces of alcohol per week. He reports that he uses illicit drugs (Marijuana).  Allergies  Allergen Reactions  . Morphine And Related Hives and Rash    Pt states he can take PO, but not IV and he is able to tolerate Dilaudid with no reactions.    Family History  Problem Relation Age of Onset  . Sickle cell anemia  Mother   . Sickle cell anemia Father   . Sickle cell trait Brother     Prior to Admission medications   Medication Sig Start Date End Date Taking? Authorizing Provider  ergocalciferol (VITAMIN D2) 50000 UNITS capsule Take 50,000 Units by mouth once a week. On Tuesday 04/22/13  Yes Dorena Dew, FNP  folic acid (FOLVITE) 1 MG tablet Take 1 tablet (1 mg total) by mouth every morning. 06/05/12  Yes [name], MD  HYDROmorphone (DILAUDID) 4 MG tablet Take 1 tablet (4 mg total) by mouth every 4 (four) hours as needed for severe pain. 05/23/13  Yes Dorena Dew, FNP  hydroxyurea (HYDREA) 500 MG capsule Take 1,000 mg by mouth daily. May  take with food to minimize GI side effects. 03/30/13  Yes Elwyn Reach, MD  morphine (MS CONTIN) 15 MG 12 hr tablet Take 1 tablet (15 mg total) by mouth 2 (two) times daily. 05/23/13  Yes Dorena Dew, FNP  potassium chloride SA (K-DUR,KLOR-CON) 20 MEQ tablet Take 1 tablet (20 mEq total) by mouth every morning. 03/30/13  Yes Elwyn Reach, MD  Rivaroxaban (XARELTO) 20 MG TABS tablet Take 20 mg by mouth daily with supper.   Yes Historical Provider, MD  zolpidem (AMBIEN) 10 MG tablet Take 1 tablet (10 mg total) by mouth at bedtime as needed for sleep. 04/24/13  Yes Dorena Dew, FNP  celecoxib (CELEBREX) 200 MG capsule Take 1 capsule (200 mg total) by mouth 2 (two) times daily. 03/30/13   Elwyn Reach, MD   Physical Exam: Filed Vitals:   05/28/13 0845  BP: 126/78  Pulse: 88  Temp: 98.5 F (36.9 C)  TempSrc: Oral  Resp: 20  Height: 6' (1.829 m)  Weight: 169 lb (76.658 kg)  SpO2: 98%   BP 126/78  Pulse 88  Temp(Src) 98.5 F (36.9 C) (Oral)  Resp 20  Ht 6' (1.829 m)  Wt 169 lb (76.658 kg)  BMI 22.92 kg/m2  SpO2 98%  General Appearance:    Alert, cooperative, no distress, appears stated age  Head:    Normocephalic, without obvious abnormality, atraumatic  Eyes:    PERRL, conjunctiva/corneas clear, EOM's intact, fundi    benign, both eyes anicteric.       Throat:   Lips, mucosa, and tongue normal; teeth and gums normal  Neck:   Supple, symmetrical, trachea midline, no adenopathy;       thyroid:  No enlargement/tenderness/nodules; no carotid   bruit or JVD  Back:     Symmetric, no curvature, ROM normal, no CVA tenderness  Lungs:     Clear to auscultation bilaterally, respirations unlabored  Chest wall:    No tenderness or deformity  Heart:    Regular rate and rhythm, S1 and S2 normal, no murmur, rub   or gallop  Abdomen:     Soft, non-tender, bowel sounds active all four quadrants,    no masses, no organomegaly  Extremities:   Extremities normal, atraumatic, no  cyanosis or edema  Pulses:   2+ and symmetric all extremities  Skin:   Skin color, texture, turgor normal, no rashes or lesions  Lymph nodes:   Cervical, supraclavicular, and axillary nodes normal  Neurologic:   CNII-XII intact. Normal strength, sensation and reflexes      throughout    Labs on Admission:   Basic Metabolic Panel: No results found for this basename: NA, K, CL, CO2, GLUCOSE, BUN, CREATININE, CALCIUM, MG, PHOS,  in the last  168 hours Liver Function Tests: No results found for this basename: AST, ALT, ALKPHOS, BILITOT, PROT, ALBUMIN,  in the last 168 hours CBC: No results found for this basename: WBC, NEUTROABS, HGB, HCT, MCV, PLT,  in the last 168 hours BNP: No components found with this basename: POCBNP,     Assessment/Plan: Active Problems: 1.Hb SS with crisis:  Pt will be treated with initially treated with weight based rapid re-dosing IVF and Toradol . If pain is above 7/10 then Pt will be transitioned to a weight based (Dilaudid/Morpiine) PCA . Pt will also receive Toradol and I have started Lyrica for adjunctive care. Patient will be re-evaluated for pain in the context of function and relationship to baseline as care progresses.  -Please note that patient was recently discharged from the hospital with Acute Chest Syndrome and was prescribed Levaquin. However patient admits that he did not fill the prescription. He currently has no hypoxemia or signs of infection.   2. Dehydration:  Pt presented with mild hydration. He will be managed with IVF D5.45 due to the hydrophobic nature of sickled cells.  3. Emesis: Pt has had 3 episodes of emesis which is likely triggered by pain. I have ordered an IV dose of Phenergan to be given before IV analgesics and will continue to treat with antiemetics as indicated.  3. Chronic medications: Pt has not taken his home medications and those required this morning will be reordered including Folic acid, hydrea, Xarelto, Vitamin D,       Time spend: 40 minutes Code Status: Full Code Family Communication: N/A Disposition Plan:  Likely home after treatment.  [name], MD  Pager [phone]  If 7PM-7AM, please contact night-coverage [email or web address] Password [facility] 05/28/2013, 9:29 AM

## 2013-05-28 NOTE — Telephone Encounter (Signed)
Patient C/O of pain to ribs, back and legs that is 8/10 on pain scale.   Per patient he has taken dilaudid and morphine around 6:00.Patient denies fever, chest pain, abdominal pain or diarrhea.  Patient also denies priapism.  Patient C/O nausea, and one episode of vomiting, currently while on the telephone.  Per patient he doesn't have anything at home to take for N/V.  I explained that I would notify the physician and give him a call back.  Patient verbalizes understanding.

## 2013-05-28 NOTE — Discharge Summary (Signed)
Physician Discharge Summary  [name] JSE:[id number] DOB: 1980/11/08 DOA: 05/28/2013  PCP: [name],[name], MD  Admit date: 05/28/2013 Discharge date: 05/28/2013  Discharge Diagnoses:  Sickle cell anemia with pain  Discharge Condition: Stable  Disposition:   Diet:Heart Healthy Wt Readings from Last 3 Encounters:  05/28/13 169 lb (76.658 kg)  05/23/13 169 lb (76.658 kg)  05/13/13 169 lb 12.1 oz (77 kg)    History of present illness:  Pt with Hb SS presents with pain localized to thighs and left ribs which began 41/2 hours prior to arrival. Pt describes pain as sharp and non-radiating and associated with 2 episodes of emesis. Pt states pain is at intensity of 8/10 and normally has a baseline pain of 3-4/10. Pt states that emesis is no bilious and non-hematematous. He denies any cough, fever, chills, diarrhea of chest pain.      Hospital Course:  Hb SS with crisis: Pt was treated initially treated with weight based rapid re-dosing of dilaudid,  IVF and Toradol. Pain intensity remained greater than 7, patient transitioned to a weight based Dilaudid PCA. Patient was started on Lyrica 30 mh for adjunctive care.  Patient used a total of 14.4 mg of dilaudid via PCA with 19 demands and 10 deliveries. Pain intensity decreased to 5/10 prior to discharge.  Patient is currently eating and drinking with no signs of dehydration. Patient did not take home medications prior to arrival, was given home medications. Patient was given Dilaudid 4 mg po 30 minutes prior to discharge. Patient is alert, oriented, and functional. He states that he can function at home on current medication regimen. Patient is to follow up in office as scheduled.   Discharge Exam:  Filed Vitals:   05/28/13 1635  BP: 126/78  Pulse: 88  Temp:   Resp: 18   Filed Vitals:   05/28/13 1225 05/28/13 1243 05/28/13 1400 05/28/13 1635  BP:  138/74  126/78  Pulse: 82 100 90 88  Temp:  98.6 F (37 C)    TempSrc:   Oral    Resp: 20 18 22 18   Height:      Weight:      SpO2: 95% 97% 94% 96%   General Appearance:  Alert, cooperative, no distress, appears stated age  Head:  Normocephalic, without obvious abnormality, atraumatic  Eyes: PERRL, conjunctiva/corneas clear, EOM's intact, fundi  benign, both eyes anicteric.  Throat:  Lips, mucosa, and tongue normal; teeth and gums normal  Neck: Supple, symmetrical, trachea midline, no adenopathy;  thyroid: No enlargement/tenderness/nodules; no carotid  bruit or JVD  Back:  Symmetric, no curvature, ROM normal, no CVA tenderness  Lungs:  Clear to auscultation bilaterally, respirations unlabored  Chest wall:  No tenderness or deformity  Heart:  Regular rate and rhythm, S1 and S2 normal. Murmur present Abdomen: Soft, non-tender, bowel sounds active all four quadrants,  no masses, no organomegaly  Extremities:  Extremities normal, atraumatic, no cyanosis or edema   Discharge Instructions   Future Appointments Provider Department Dept Phone   08/22/2013 11:30 AM [name], MD [facility] [phone]       Medication List         celecoxib 200 MG capsule  Commonly known as:  CELEBREX  Take 1 capsule (200 mg total) by mouth 2 (two) times daily.     ergocalciferol 50000 UNITS capsule  Commonly known as:  VITAMIN D2  Take 50,000 Units by mouth once a week. On Tuesday  folic acid 1 MG tablet  Commonly known as:  FOLVITE  Take 1 tablet (1 mg total) by mouth every morning.     HYDROmorphone 4 MG tablet  Commonly known as:  DILAUDID  Take 1 tablet (4 mg total) by mouth every 4 (four) hours as needed for severe pain.     hydroxyurea 500 MG capsule  Commonly known as:  HYDREA  Take 1,000 mg by mouth daily. May take with food to minimize GI side effects.     morphine 15 MG 12 hr tablet  Commonly known as:  MS CONTIN  Take 1 tablet (15 mg total) by mouth 2 (two) times daily.     potassium chloride SA 20 MEQ  tablet  Commonly known as:  K-DUR,KLOR-CON  Take 1 tablet (20 mEq total) by mouth every morning.     XARELTO 20 MG Tabs tablet  Generic drug:  rivaroxaban  Take 20 mg by mouth daily with supper.     zolpidem 10 MG tablet  Commonly known as:  AMBIEN  Take 1 tablet (10 mg total) by mouth at bedtime as needed for sleep.          The results of significant diagnostics from this hospitalization (including imaging, microbiology, ancillary and laboratory) are listed below for reference.    Significant Diagnostic Studies: Dg Chest 2 View  05/14/2013   CLINICAL DATA:  Acute chest syndrome.  EXAM: CHEST  2 VIEW  COMPARISON:  05/13/2013  FINDINGS: Heart is enlarged. Patient also sided power port, tip overlying the level of the lower superior vena cava. There mild perihilar changes of pulmonary edema. No focal consolidations or pleural effusions are identified.  IMPRESSION: Cardiomegaly and mild pulmonary edema.   Electronically Signed   By: Shon Hale M.D.   On: 05/14/2013 15:28   Dg Chest 2 View  05/13/2013   CLINICAL DATA:  Weakness, fatigue and body aches. Sickle cell disease.  EXAM: CHEST  2 VIEW  COMPARISON:  Chest x-ray 04/09/2013.  FINDINGS: Mild diffuse interstitial prominence and patchy airspace opacities, most notably in the right lower lobe, concerning for acute chest syndrome in this patient history of sickle cell disease. No pleural effusion. No definite evidence of pulmonary edema. Heart size remains mildly enlarged. Upper mediastinal contours are within normal limits. Left subclavian single-lumen power porta cath with tip terminating at the superior cavoatrial junction. Surgical clips project over the right upper quadrant of the abdomen, compatible with prior cholecystectomy.  IMPRESSION: 1. Interval development of interstitial and patchy airspace opacities concerning for acute chest syndrome in this patient with history of sickle cell disease. Clinical correlation is recommended. 2.  Mild cardiomegaly (unchanged).   Electronically Signed   By: Vinnie Langton M.D.   On: 05/13/2013 16:46   Ct Angio Chest Pe W/cm &/or Wo Cm  05/18/2013   CLINICAL DATA:  Sickle-cell crisis with chest pain. Evaluate for pulmonary embolus.  EXAM: CT ANGIOGRAPHY CHEST WITH CONTRAST  TECHNIQUE: Multidetector CT imaging of the chest was performed using the standard protocol during bolus administration of intravenous contrast. Multiplanar CT image reconstructions and MIPs were obtained to evaluate the vascular anatomy.  CONTRAST:  171mL OMNIPAQUE IOHEXOL 350 MG/ML SOLN  COMPARISON:  Chest x-ray 05/14/2013.  CT chest 03/26/2013.  FINDINGS: Mediastinum: No filling defects are noted within the pulmonary arterial tree to suggest pulmonary emboli. Heart size is increased. No pericardial fluid, thickening or calcification. No acute abnormality of the thoracic aorta or other great vessels of the mediastinum. Mild  right hilar and mediastinal lymph node prominence, stable. The esophagus is normal in appearance. Layering mucus within the proximal right mainstem bronchus. Stable Port-A-Cath.  Lungs/Pleura: No pleural effusions. No suspicious appearing pulmonary nodules or masses. Peripheral areas of ground-glass opacity in both lungs, likely representing a acute chest syndrome. Small vessel infarction with hemorrhagic edema not excluded. No definite bronchopneumonia.  Upper Abdomen: Visualized portions of the upper abdomen are unremarkable. 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  Musculoskeletal: No aggressive appearing lytic or blastic lesions are noted in the visualized portions of the skeleton. Sickle cell endplate infarctions.  Review of the MIP images confirms the above findings.  IMPRESSION: No pulmonary embolus. Stable peripheral ground-glass opacities, without definite bronchopneumonia.   Electronically Signed   By: Rolla Flatten M.D.   On: 05/18/2013 09:16     Microbiology: No results found for this or any previous visit (from the past 240 hour(s)).   Labs: Basic Metabolic Panel: No results found for this basename: NA, K, CL, CO2, GLUCOSE, BUN, CREATININE, CALCIUM, MG, PHOS,  in the last 168 hours Liver Function Tests: No results found for this basename: AST, ALT, ALKPHOS, BILITOT, PROT, ALBUMIN,  in the last 168 hours No results found for this basename: LIPASE, AMYLASE,  in the last 168 hours No results found for this basename: AMMONIA,  in the last 168 hours CBC:  Recent Labs Lab 05/28/13 0936  WBC 20.4*  NEUTROABS 16.6*  HGB 8.7*  HCT 25.1*  MCV 93.0  PLT 626*   Cardiac Enzymes: No results found for this basename: CKTOTAL, CKMB, CKMBINDEX, TROPONINI,  in the last 168 hours BNP: No components found with this basename: POCBNP,  CBG: No results found for this basename: GLUCAP,  in the last 168 hours Ferritin: No results found for this basename: FERRITIN,  in the last 168 hours  Time coordinating discharge: Greater than 30 minutes  Signed:  Dorena Dew  05/28/2013, 4:40 PM    Dorena Dew, FNP

## 2013-05-28 NOTE — Progress Notes (Signed)
Patient ID: [name], male   DOB: 04-17-1980, 33 y.o.   MRN: [id number] Pt discharged as he states he is feeling better at a 5/10 on pain scale. Pt anxious to leave as he states he has stuff to do.  POrt flushed as per protocol, site dry and intact.  Discharge papers reviewed and signed.  Pt taking a taxi home as per pt.  Pt left ambulating by himself at 1727hrs in no acute distress.

## 2013-05-28 NOTE — Telephone Encounter (Signed)
Called patient back and advise that I spoke with Dr. Zigmund Daniel and it is ok for him to come to the [facility].  Patient verbalizes understanding.

## 2013-05-29 IMAGING — CR DG CHEST 2V
2 series · 2 of 2 positions shown · non-contrast
Comparison: 06/05/2010.

CLINICAL DATA: Bilateral lower chest pain.  Shortness of breath.
Sickle cell crisis.

CHEST - 2 VIEW

[w chest pa]
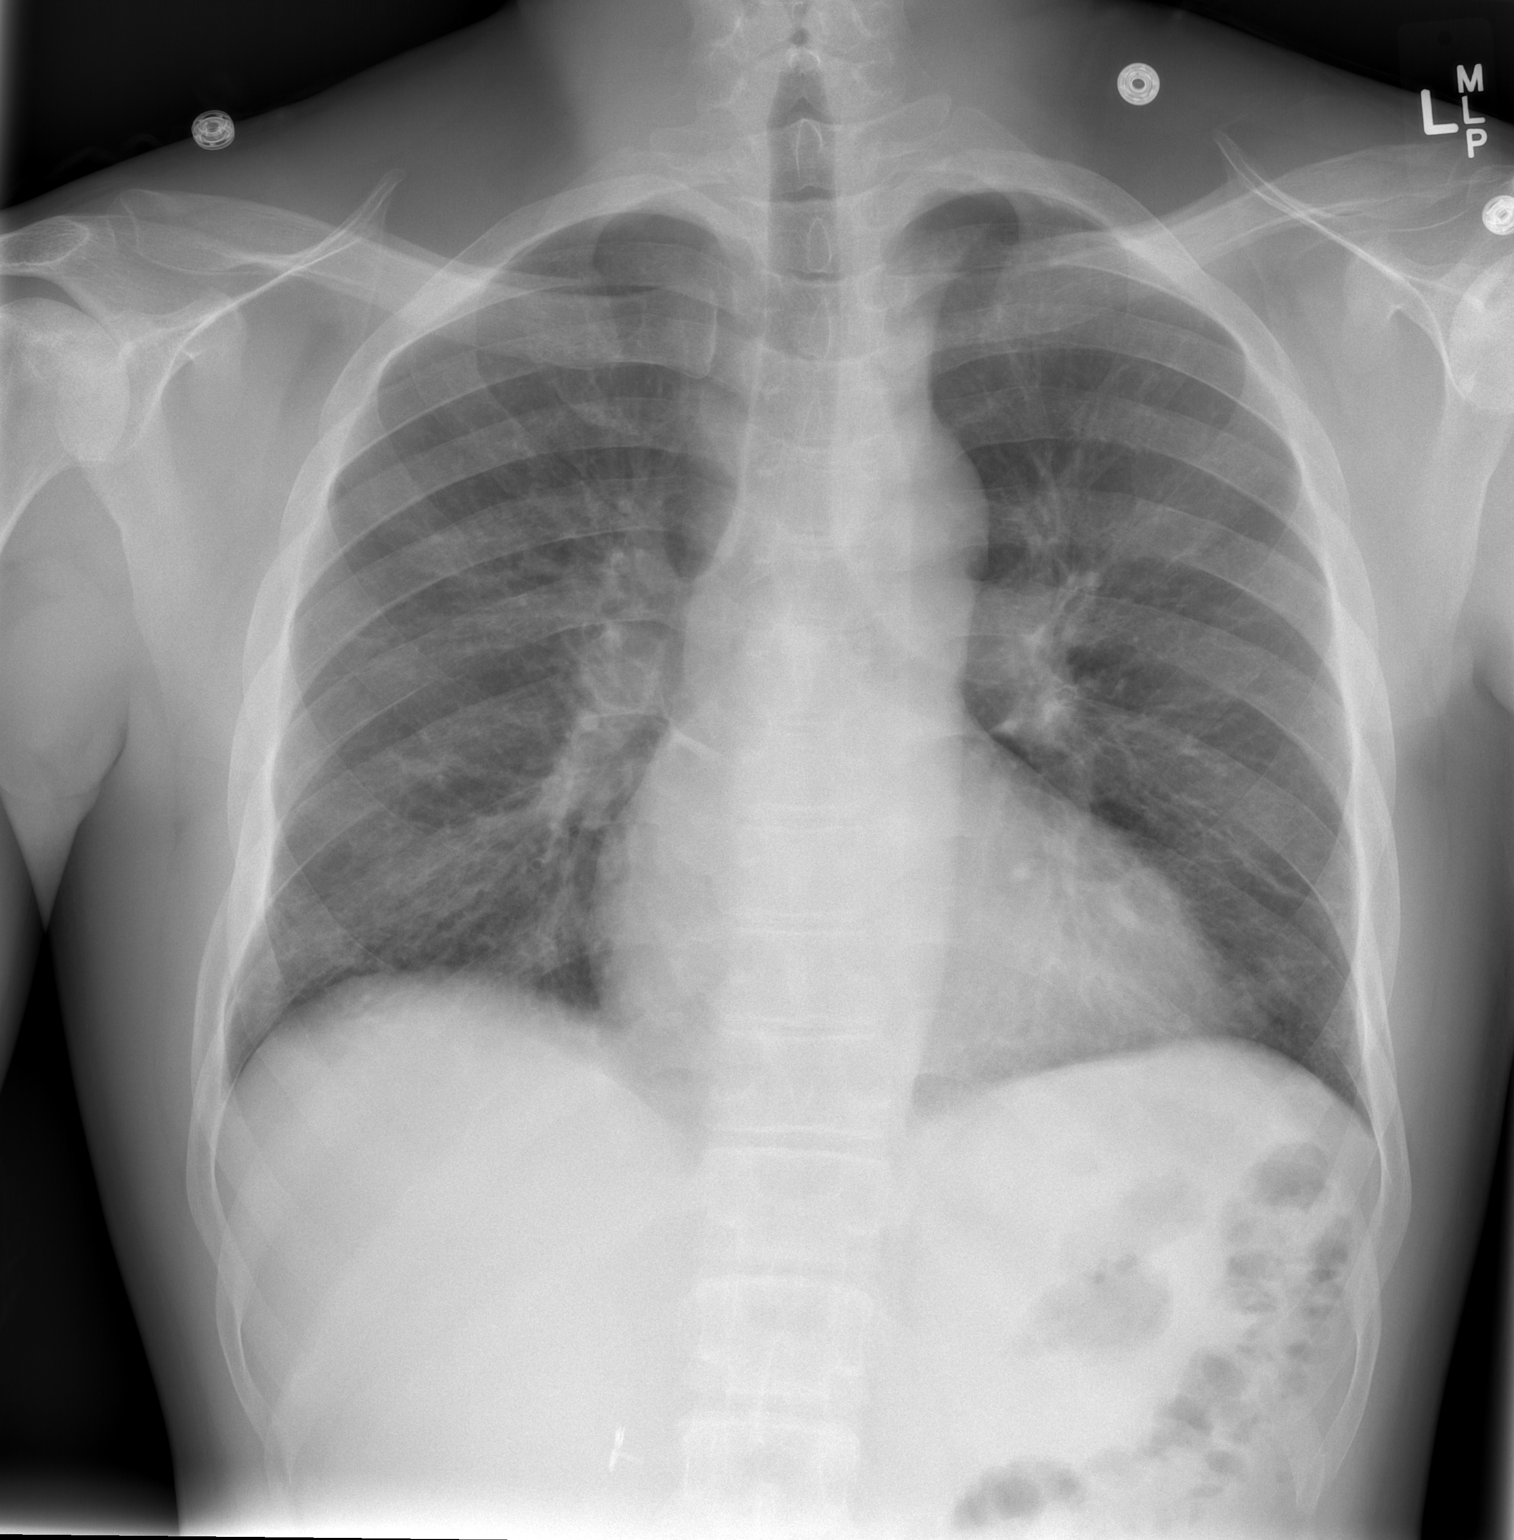

[w chest lat]
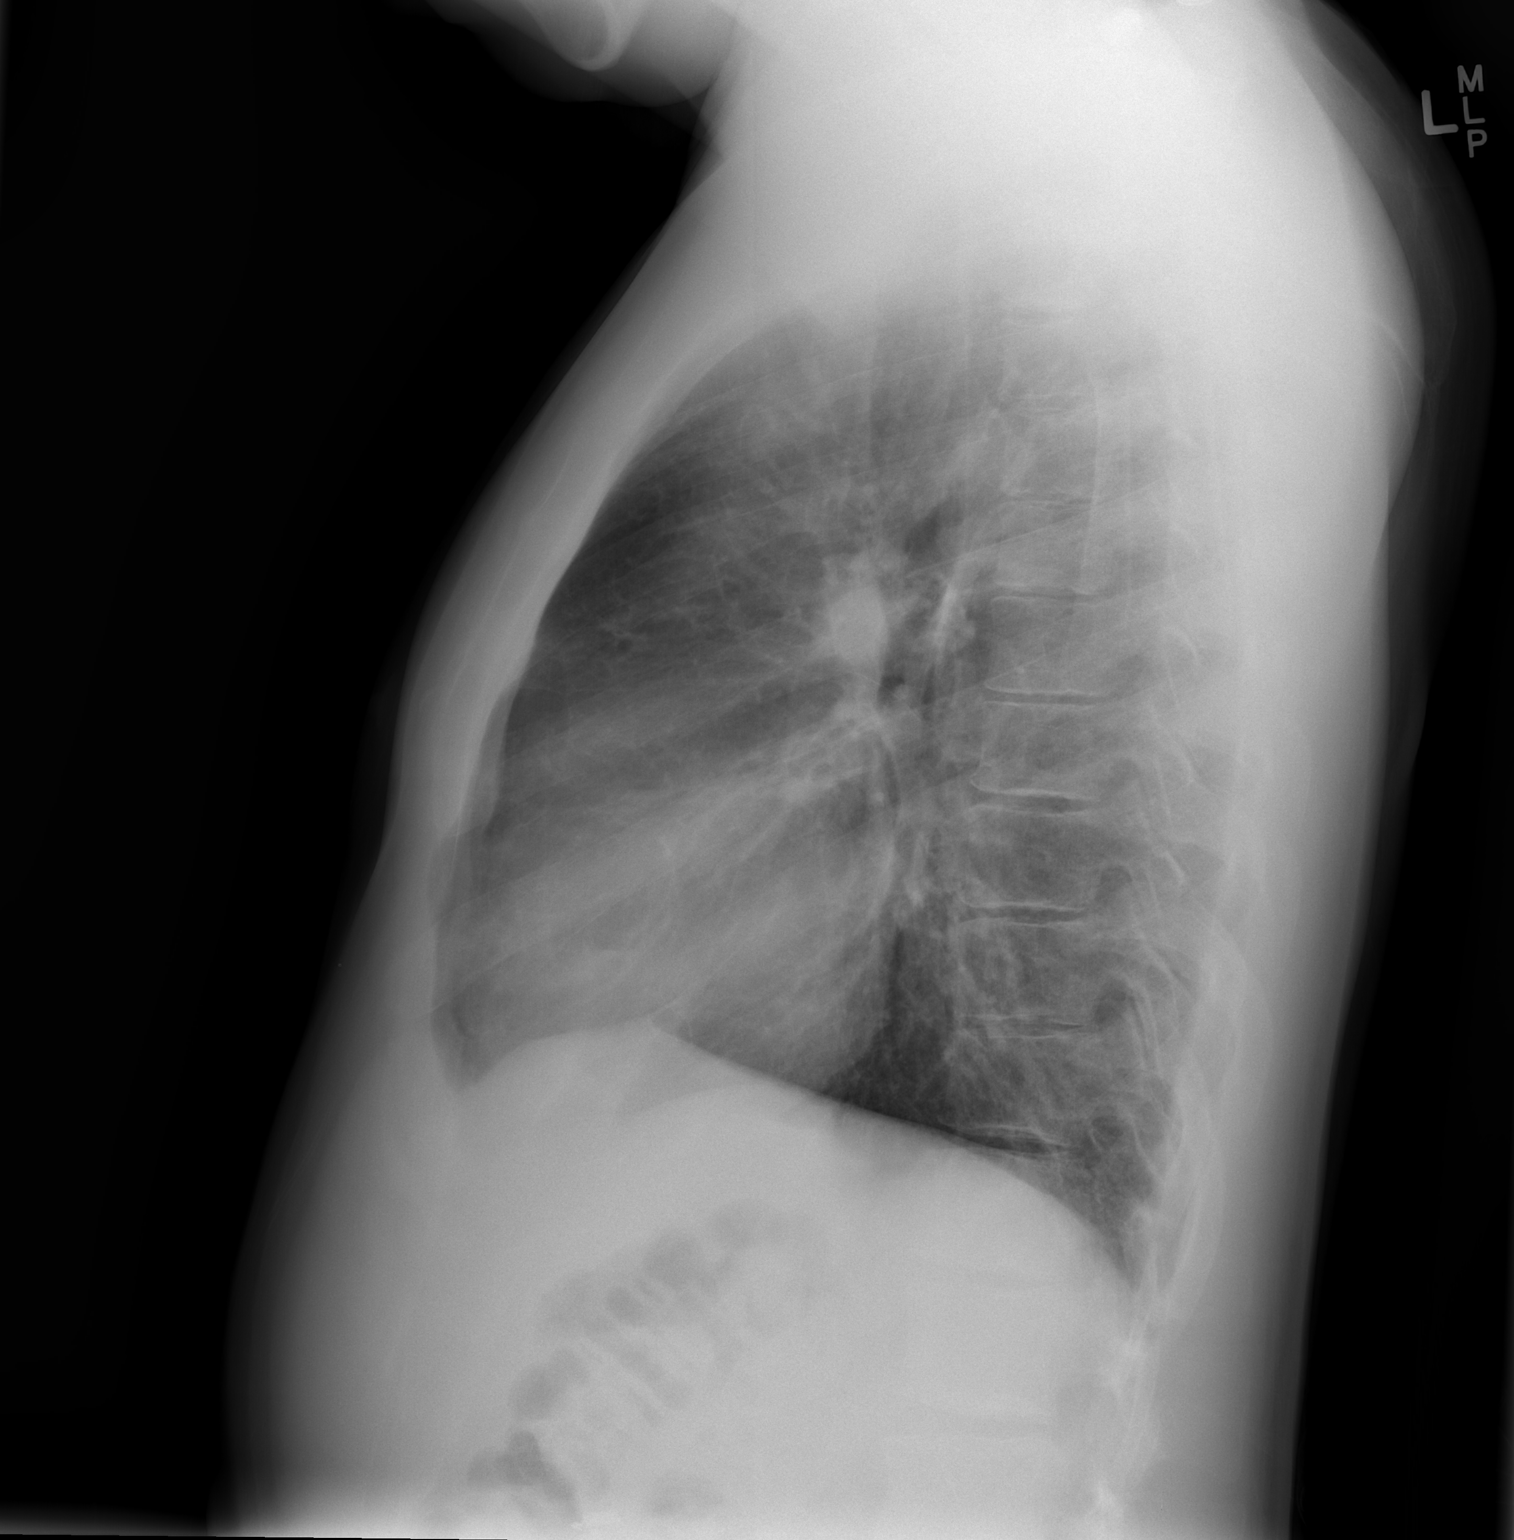

[2 of 2 positions shown; findings below may reference images not displayed]

FINDINGS: There is stable minimal enlargement of the cardiac
silhouette.  Mediastinal and hilar contours are stable.  Interval
removal of right PICC.  No pneumothorax.  No pleural effusion or
thickening.  No acute pulmonary infiltrates.  No skeletal lesion is
seen.  Cholecystectomy clips are present.
IMPRESSION: Stable minimal enlargement of the cardiac silhouette.  No acute
superimposed process.

## 2013-06-01 IMAGING — CR DG CHEST 2V
2 series · 2 of 2 positions shown · non-contrast
Comparison: 07/06/2010

CLINICAL DATA: Pain.  Sickle cell crisis.

CHEST - 2 VIEW

[w chest pa]
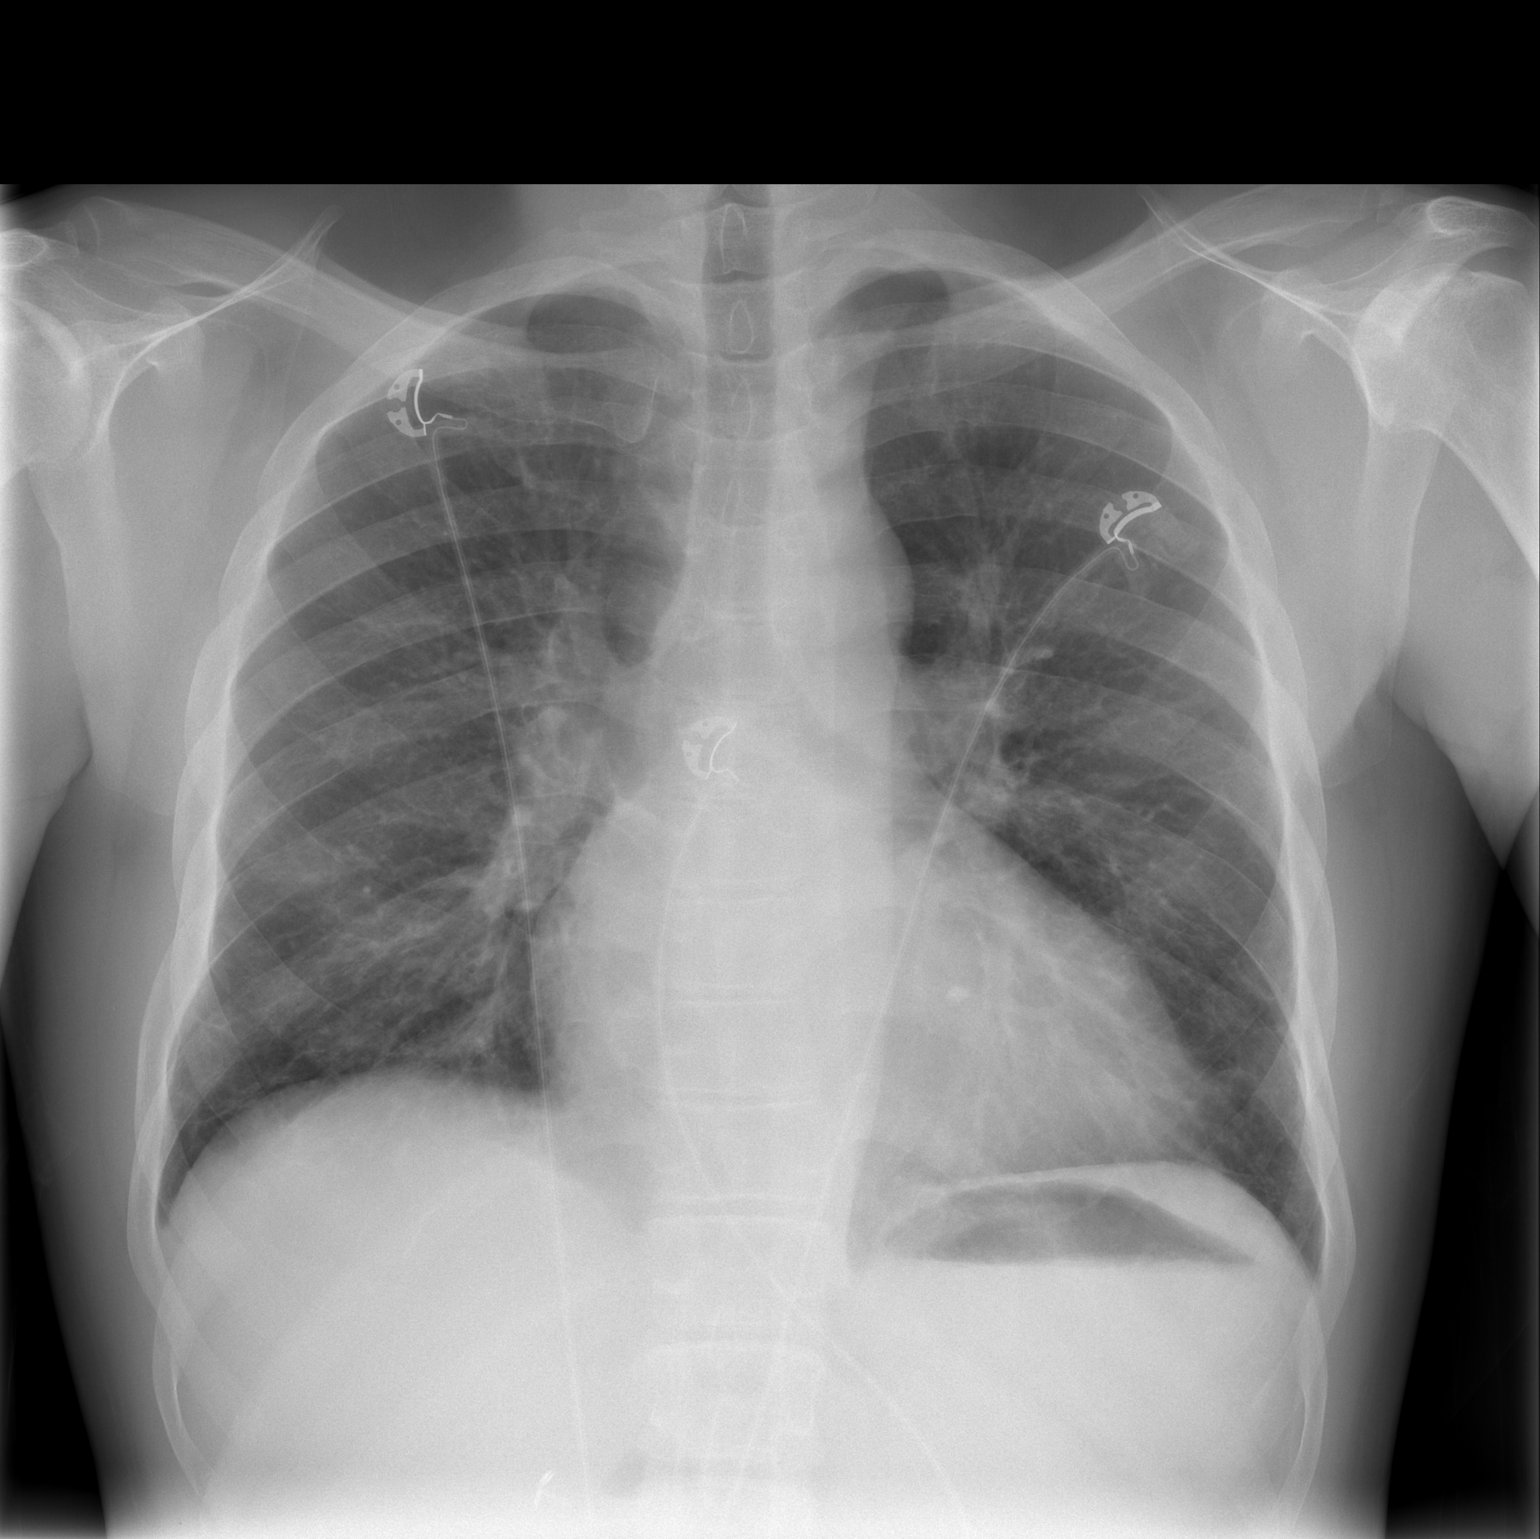

[w chest lat]
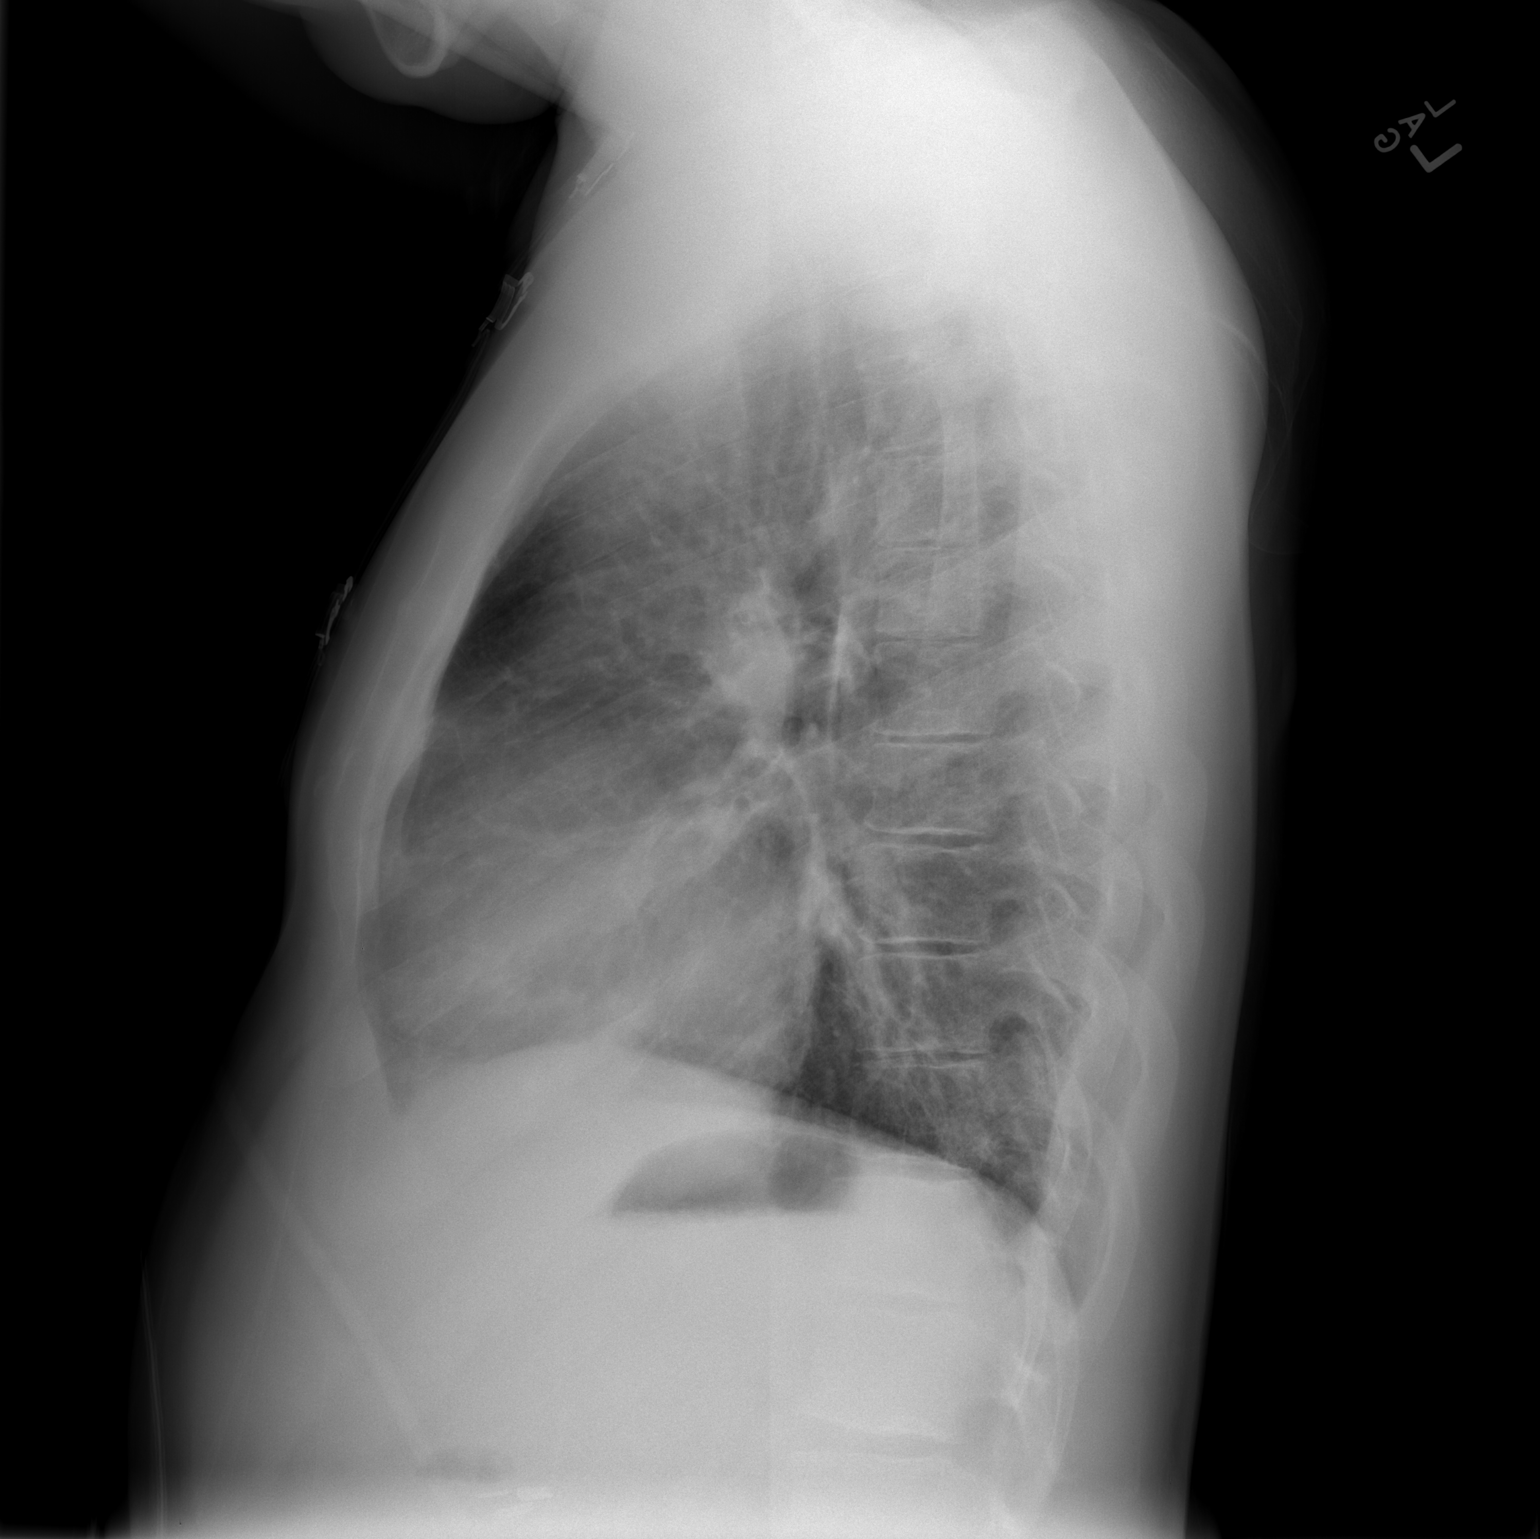

[2 of 2 positions shown; findings below may reference images not displayed]

FINDINGS: Mild cardiac enlargement to with normal pulmonary
vascularity.  No focal airspace disease or consolidation in the
lungs.  No blunting of costophrenic angles.  No significant change
since the previous study.
IMPRESSION: Mild cardiac enlargement.  No evidence of active pulmonary disease.

## 2013-06-01 IMAGING — CR DG CHEST 1V PORT
1 series · 1 of 1 positions shown · non-contrast
Comparison: 07/09/2010

CLINICAL DATA: Evaluate PICC line placement

PORTABLE CHEST - 1 VIEW

[AP]
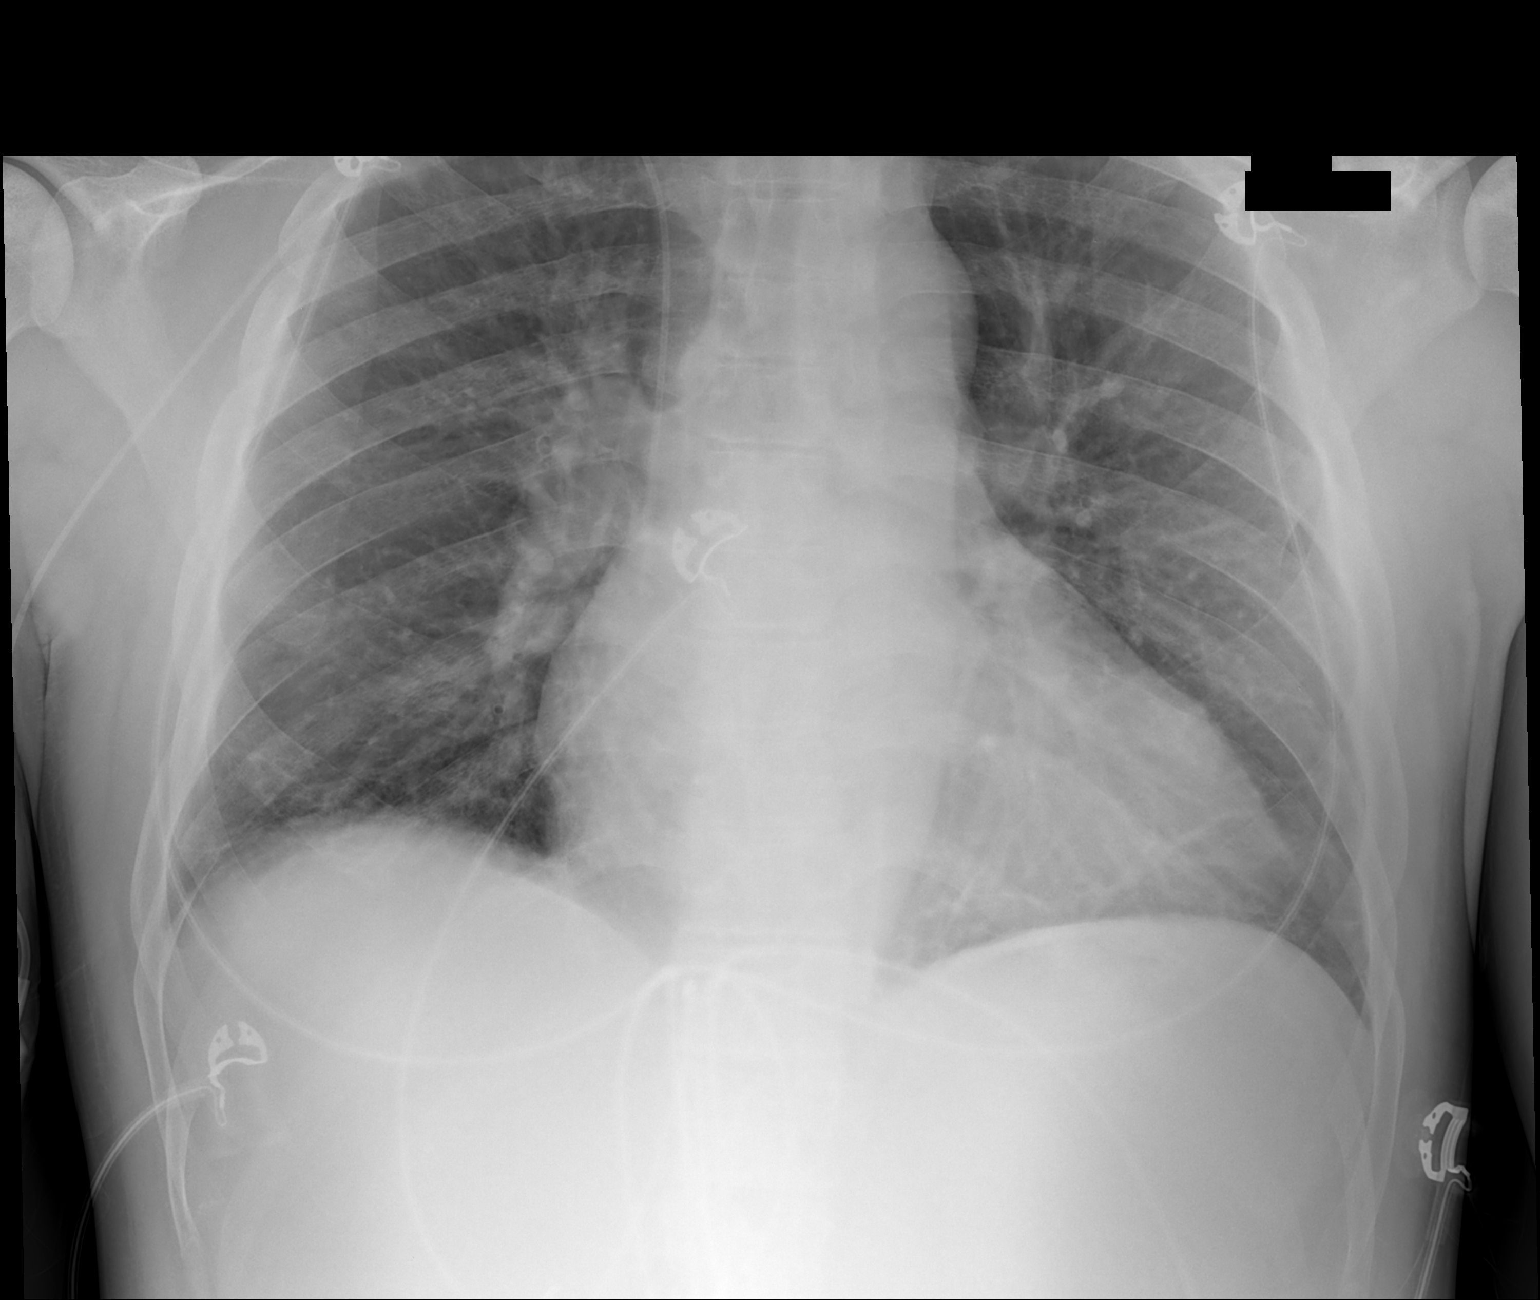

[1 of 1 positions shown; findings below may reference images not displayed]

FINDINGS: Right arm PICC line tip is in the cavoatrial junction.

Mild cardiac enlargement.

No pleural effusion or pulmonary edema.

Scar versus plate-like atelectasis in the right base.
IMPRESSION: 1.  Right arm PICC line tip is in satisfactory position within the
cavoatrial junction.

## 2013-06-02 ENCOUNTER — Emergency Department (HOSPITAL_COMMUNITY): Payer: Medicare Other

## 2013-06-02 ENCOUNTER — Encounter (HOSPITAL_COMMUNITY): Payer: Self-pay | Admitting: Emergency Medicine

## 2013-06-02 ENCOUNTER — Inpatient Hospital Stay (HOSPITAL_COMMUNITY)
Admission: EM | Admit: 2013-06-02 | Discharge: 2013-06-05 | DRG: 193 | Disposition: A | Discharge status: Home / Self Care | Payer: Medicare Other | Attending: Internal Medicine | Admitting: Internal Medicine

## 2013-06-02 DIAGNOSIS — Z832 Family history of diseases of the blood and blood-forming organs and certain disorders involving the immune mechanism: Secondary | ICD-10-CM | POA: Diagnosis not present

## 2013-06-02 DIAGNOSIS — I10 Essential (primary) hypertension: Secondary | ICD-10-CM | POA: Diagnosis present

## 2013-06-02 DIAGNOSIS — Z96649 Presence of unspecified artificial hip joint: Secondary | ICD-10-CM

## 2013-06-02 DIAGNOSIS — Z87891 Personal history of nicotine dependence: Secondary | ICD-10-CM

## 2013-06-02 DIAGNOSIS — Z7901 Long term (current) use of anticoagulants: Secondary | ICD-10-CM

## 2013-06-02 DIAGNOSIS — F1210 Cannabis abuse, uncomplicated: Secondary | ICD-10-CM | POA: Diagnosis present

## 2013-06-02 DIAGNOSIS — I498 Other specified cardiac arrhythmias: Secondary | ICD-10-CM | POA: Diagnosis present

## 2013-06-02 DIAGNOSIS — J189 Pneumonia, unspecified organism: Secondary | ICD-10-CM | POA: Diagnosis present

## 2013-06-02 DIAGNOSIS — E876 Hypokalemia: Secondary | ICD-10-CM | POA: Diagnosis present

## 2013-06-02 DIAGNOSIS — D72829 Elevated white blood cell count, unspecified: Secondary | ICD-10-CM | POA: Diagnosis present

## 2013-06-02 DIAGNOSIS — R0902 Hypoxemia: Secondary | ICD-10-CM

## 2013-06-02 DIAGNOSIS — I2510 Atherosclerotic heart disease of native coronary artery without angina pectoris: Secondary | ICD-10-CM | POA: Diagnosis present

## 2013-06-02 DIAGNOSIS — D5700 Hb-SS disease with crisis, unspecified: Secondary | ICD-10-CM | POA: Diagnosis not present

## 2013-06-02 DIAGNOSIS — IMO0002 Reserved for concepts with insufficient information to code with codable children: Secondary | ICD-10-CM

## 2013-06-02 DIAGNOSIS — R079 Chest pain, unspecified: Secondary | ICD-10-CM | POA: Diagnosis not present

## 2013-06-02 DIAGNOSIS — D5701 Hb-SS disease with acute chest syndrome: Secondary | ICD-10-CM | POA: Diagnosis not present

## 2013-06-02 DIAGNOSIS — Z79899 Other long term (current) drug therapy: Secondary | ICD-10-CM

## 2013-06-02 DIAGNOSIS — J9601 Acute respiratory failure with hypoxia: Secondary | ICD-10-CM | POA: Diagnosis present

## 2013-06-02 DIAGNOSIS — I2782 Chronic pulmonary embolism: Secondary | ICD-10-CM | POA: Diagnosis present

## 2013-06-02 DIAGNOSIS — F39 Unspecified mood [affective] disorder: Secondary | ICD-10-CM | POA: Diagnosis present

## 2013-06-02 DIAGNOSIS — J9600 Acute respiratory failure, unspecified whether with hypoxia or hypercapnia: Secondary | ICD-10-CM | POA: Diagnosis not present

## 2013-06-02 DIAGNOSIS — I214 Non-ST elevation (NSTEMI) myocardial infarction: Secondary | ICD-10-CM

## 2013-06-02 DIAGNOSIS — E559 Vitamin D deficiency, unspecified: Secondary | ICD-10-CM

## 2013-06-02 DIAGNOSIS — I2789 Other specified pulmonary heart diseases: Secondary | ICD-10-CM | POA: Diagnosis present

## 2013-06-02 DIAGNOSIS — J984 Other disorders of lung: Secondary | ICD-10-CM | POA: Diagnosis not present

## 2013-06-02 DIAGNOSIS — R7989 Other specified abnormal findings of blood chemistry: Secondary | ICD-10-CM

## 2013-06-02 DIAGNOSIS — R0602 Shortness of breath: Secondary | ICD-10-CM | POA: Diagnosis not present

## 2013-06-02 DIAGNOSIS — Z86711 Personal history of pulmonary embolism: Secondary | ICD-10-CM | POA: Diagnosis not present

## 2013-06-02 LAB — BLOOD GAS, ARTERIAL
Acid-base deficit: 3.3 mmol/L — ABNORMAL HIGH (ref 0.0–2.0)
Bicarbonate: 20.8 mEq/L (ref 20.0–24.0)
Drawn by: 295031
O2 CONTENT: 4 L/min
O2 SAT: 94.2 %
PH ART: 7.386 (ref 7.350–7.450)
Patient temperature: 98.6
TCO2: 19.9 mmol/L (ref 0–100)
pCO2 arterial: 35.5 mmHg (ref 35.0–45.0)
pO2, Arterial: 89.4 mmHg (ref 80.0–100.0)

## 2013-06-02 LAB — CBC WITH DIFFERENTIAL/PLATELET
BASOS ABS: 0 10*3/uL (ref 0.0–0.1)
BASOS PCT: 1 % (ref 0–1)
Basophils Absolute: 0.3 10*3/uL — ABNORMAL HIGH (ref 0.0–0.1)
Basophils Relative: 0 % (ref 0–1)
EOS ABS: 0.3 10*3/uL (ref 0.0–0.7)
Eosinophils Absolute: 0.2 10*3/uL (ref 0.0–0.7)
Eosinophils Relative: 1 % (ref 0–5)
Eosinophils Relative: 1 % (ref 0–5)
HCT: 22.2 % — ABNORMAL LOW (ref 39.0–52.0)
HEMATOCRIT: 23.9 % — AB (ref 39.0–52.0)
HEMOGLOBIN: 8.5 g/dL — AB (ref 13.0–17.0)
Hemoglobin: 7.7 g/dL — ABNORMAL LOW (ref 13.0–17.0)
LYMPHS ABS: 3.9 10*3/uL (ref 0.7–4.0)
LYMPHS PCT: 14 % (ref 12–46)
Lymphocytes Relative: 13 % (ref 12–46)
Lymphs Abs: 3.1 10*3/uL (ref 0.7–4.0)
MCH: 32.1 pg (ref 26.0–34.0)
MCH: 32.2 pg (ref 26.0–34.0)
MCHC: 35.6 g/dL (ref 30.0–36.0)
MCHC: 34.7 g/dL (ref 30.0–36.0)
MCV: 90.2 fL (ref 78.0–100.0)
MCV: 92.9 fL (ref 78.0–100.0)
MONOS PCT: 14 % — AB (ref 3–12)
Monocytes Absolute: 3 10*3/uL — ABNORMAL HIGH (ref 0.1–1.0)
Monocytes Absolute: 3.4 10*3/uL — ABNORMAL HIGH (ref 0.1–1.0)
Monocytes Relative: 11 % (ref 3–12)
NEUTROS ABS: 20.2 10*3/uL — AB (ref 1.7–7.7)
Neutro Abs: 17.4 10*3/uL — ABNORMAL HIGH (ref 1.7–7.7)
Neutrophils Relative %: 73 % (ref 43–77)
Neutrophils Relative %: 72 % (ref 43–77)
Platelets: 515 10*3/uL — ABNORMAL HIGH (ref 150–400)
Platelets: 451 10*3/uL — ABNORMAL HIGH (ref 150–400)
RBC: 2.65 MIL/uL — ABNORMAL LOW (ref 4.22–5.81)
RBC: 2.39 MIL/uL — ABNORMAL LOW (ref 4.22–5.81)
RDW: 22 % — ABNORMAL HIGH (ref 11.5–15.5)
RDW: 21.9 % — AB (ref 11.5–15.5)
WBC: 27.7 10*3/uL — ABNORMAL HIGH (ref 4.0–10.5)
WBC: 24.1 10*3/uL — AB (ref 4.0–10.5)

## 2013-06-02 LAB — RETICULOCYTES
RBC.: 2.65 MIL/uL — ABNORMAL LOW (ref 4.22–5.81)
RBC.: 2.39 MIL/uL — ABNORMAL LOW (ref 4.22–5.81)
Retic Ct Pct: >23 % — ABNORMAL HIGH (ref 0.4–3.1)
Retic Ct Pct: >23 % — ABNORMAL HIGH (ref 0.4–3.1)

## 2013-06-02 LAB — URINALYSIS, ROUTINE W REFLEX MICROSCOPIC
Bilirubin Urine: NEGATIVE
Glucose, UA: NEGATIVE mg/dL
HGB URINE DIPSTICK: NEGATIVE
Ketones, ur: NEGATIVE mg/dL
LEUKOCYTES UA: NEGATIVE
NITRITE: NEGATIVE
Protein, ur: NEGATIVE mg/dL
UROBILINOGEN UA: 1 mg/dL (ref 0.0–1.0)
pH: 7 (ref 5.0–8.0)

## 2013-06-02 LAB — COMPREHENSIVE METABOLIC PANEL
ALBUMIN: 3.6 g/dL (ref 3.5–5.2)
ALK PHOS: 80 U/L (ref 39–117)
ALT: 20 U/L (ref 0–53)
ALT: 17 U/L (ref 0–53)
AST: 47 U/L — ABNORMAL HIGH (ref 0–37)
AST: 33 U/L (ref 0–37)
Albumin: 4.2 g/dL (ref 3.5–5.2)
Alkaline Phosphatase: 95 U/L (ref 39–117)
BILIRUBIN TOTAL: 5.3 mg/dL — AB (ref 0.3–1.2)
BUN: 5 mg/dL — AB (ref 6–23)
BUN: 6 mg/dL (ref 6–23)
CALCIUM: 9 mg/dL (ref 8.4–10.5)
CHLORIDE: 107 meq/L (ref 96–112)
CO2: 21 mEq/L (ref 19–32)
CO2: 21 mEq/L (ref 19–32)
Calcium: 8.3 mg/dL — ABNORMAL LOW (ref 8.4–10.5)
Chloride: 104 mEq/L (ref 96–112)
Creatinine, Ser: 0.54 mg/dL (ref 0.50–1.35)
Creatinine, Ser: 0.6 mg/dL (ref 0.50–1.35)
GFR calc Af Amer: >90 mL/min (ref >90)
GFR calc non Af Amer: >90 mL/min (ref >90)
GLUCOSE: 181 mg/dL — AB (ref 70–99)
Glucose, Bld: 97 mg/dL (ref 70–99)
POTASSIUM: 3.2 meq/L — AB (ref 3.7–5.3)
Potassium: 3.2 mEq/L — ABNORMAL LOW (ref 3.7–5.3)
SODIUM: 142 meq/L (ref 137–147)
Sodium: 141 mEq/L (ref 137–147)
Total Bilirubin: 7 mg/dL — ABNORMAL HIGH (ref 0.3–1.2)
Total Protein: 7.9 g/dL (ref 6.0–8.3)
Total Protein: 6.6 g/dL (ref 6.0–8.3)

## 2013-06-02 LAB — LACTATE DEHYDROGENASE: LDH: 473 U/L — ABNORMAL HIGH (ref 94–250)

## 2013-06-02 LAB — ETHANOL

## 2013-06-02 LAB — RAPID URINE DRUG SCREEN, HOSP PERFORMED
Amphetamines: NOT DETECTED
BARBITURATES: NOT DETECTED
Benzodiazepines: NOT DETECTED
COCAINE: NOT DETECTED
Opiates: POSITIVE — AB
TETRAHYDROCANNABINOL: POSITIVE — AB

## 2013-06-02 LAB — TROPONIN I
Troponin I: 1.27 ng/mL (ref <0.30)
Troponin I: 1.41 ng/mL (ref <0.30)

## 2013-06-02 LAB — MAGNESIUM: Magnesium: 1.7 mg/dL (ref 1.5–2.5)

## 2013-06-02 LAB — MRSA PCR SCREENING: MRSA by PCR: NEGATIVE

## 2013-06-02 MED ORDER — PROMETHAZINE HCL 25 MG PO TABS: 12.5000 mg | ORAL_TABLET | ORAL | Status: DC | PRN

## 2013-06-02 MED ORDER — PROMETHAZINE HCL 12.5 MG RE SUPP
12.5000 mg | RECTAL | Status: DC | PRN
  Filled 2013-06-02: qty 2

## 2013-06-02 MED ORDER — VANCOMYCIN HCL 10 G IV SOLR
1250.0000 mg | Freq: Three times a day (TID) | INTRAVENOUS | Status: DC
  Administered 2013-06-02 – 2013-06-05 (×9): 1250 mg via INTRAVENOUS
  Filled 2013-06-02 (×11): qty 1250

## 2013-06-02 MED ORDER — VANCOMYCIN HCL IN DEXTROSE 1-5 GM/200ML-% IV SOLN: 1000.0000 mg | Freq: Once | INTRAVENOUS | Status: DC

## 2013-06-02 MED ORDER — KETOROLAC TROMETHAMINE 30 MG/ML IJ SOLN
30.0000 mg | Freq: Four times a day (QID) | INTRAMUSCULAR | Status: DC
  Administered 2013-06-02 – 2013-06-04 (×9): 30 mg via INTRAVENOUS
  Filled 2013-06-02 (×8): qty 1
  Filled 2013-06-02: qty 2
  Filled 2013-06-02 (×4): qty 1

## 2013-06-02 MED ORDER — SODIUM CHLORIDE 0.9 % IV SOLN
INTRAVENOUS | Status: DC
  Administered 2013-06-02: 1000 mL via INTRAVENOUS

## 2013-06-02 MED ORDER — SODIUM CHLORIDE 0.9 % IV BOLUS (SEPSIS)
1000.0000 mL | Freq: Once | INTRAVENOUS | Status: AC
  Administered 2013-06-02: 1000 mL via INTRAVENOUS

## 2013-06-02 MED ORDER — HYDROMORPHONE HCL 2 MG PO TABS: 4.0000 mg | ORAL_TABLET | ORAL | Status: DC | PRN

## 2013-06-02 MED ORDER — ONDANSETRON HCL 4 MG/2ML IJ SOLN
4.0000 mg | Freq: Once | INTRAMUSCULAR | Status: AC
  Administered 2013-06-02: 4 mg via INTRAVENOUS
  Filled 2013-06-02: qty 2

## 2013-06-02 MED ORDER — KETOROLAC TROMETHAMINE 30 MG/ML IJ SOLN
30.0000 mg | Freq: Once | INTRAMUSCULAR | Status: AC
  Administered 2013-06-02: 30 mg via INTRAVENOUS
  Filled 2013-06-02: qty 1

## 2013-06-02 MED ORDER — HYDROMORPHONE HCL PF 1 MG/ML IJ SOLN
1.0000 mg | Freq: Once | INTRAMUSCULAR | Status: AC
  Administered 2013-06-02: 1 mg via INTRAVENOUS
  Filled 2013-06-02: qty 1

## 2013-06-02 MED ORDER — FENTANYL CITRATE 0.05 MG/ML IJ SOLN
100.0000 ug | Freq: Once | INTRAMUSCULAR | Status: AC
  Administered 2013-06-02: 100 ug via INTRAVENOUS
  Filled 2013-06-02: qty 2

## 2013-06-02 MED ORDER — ALBUTEROL SULFATE (2.5 MG/3ML) 0.083% IN NEBU: 2.5000 mg | INHALATION_SOLUTION | RESPIRATORY_TRACT | Status: DC | PRN

## 2013-06-02 MED ORDER — MORPHINE SULFATE ER 15 MG PO TBCR
15.0000 mg | EXTENDED_RELEASE_TABLET | Freq: Two times a day (BID) | ORAL | Status: DC
  Administered 2013-06-02 – 2013-06-05 (×7): 15 mg via ORAL
  Filled 2013-06-02 (×7): qty 1

## 2013-06-02 MED ORDER — RIVAROXABAN 20 MG PO TABS
20.0000 mg | ORAL_TABLET | Freq: Every day | ORAL | Status: DC
  Administered 2013-06-02 – 2013-06-05 (×4): 20 mg via ORAL
  Filled 2013-06-02 (×4): qty 1

## 2013-06-02 MED ORDER — VANCOMYCIN HCL 10 G IV SOLR
2000.0000 mg | Freq: Once | INTRAVENOUS | Status: AC
  Administered 2013-06-02: 2000 mg via INTRAVENOUS
  Filled 2013-06-02: qty 2000

## 2013-06-02 MED ORDER — FOLIC ACID 1 MG PO TABS
1.0000 mg | ORAL_TABLET | Freq: Every morning | ORAL | Status: DC
  Administered 2013-06-02 – 2013-06-05 (×4): 1 mg via ORAL
  Filled 2013-06-02 (×4): qty 1

## 2013-06-02 MED ORDER — IPRATROPIUM-ALBUTEROL 0.5-2.5 (3) MG/3ML IN SOLN
3.0000 mL | RESPIRATORY_TRACT | Status: DC
  Administered 2013-06-02 (×3): 3 mL via RESPIRATORY_TRACT
  Filled 2013-06-02 (×3): qty 3

## 2013-06-02 MED ORDER — DIPHENHYDRAMINE HCL 50 MG/ML IJ SOLN
12.5000 mg | INTRAMUSCULAR | Status: DC | PRN
  Administered 2013-06-03 – 2013-06-05 (×7): 25 mg via INTRAVENOUS
  Filled 2013-06-02 (×7): qty 1

## 2013-06-02 MED ORDER — HYDROMORPHONE HCL PF 2 MG/ML IJ SOLN
2.0000 mg | Freq: Once | INTRAMUSCULAR | Status: AC
  Administered 2013-06-02: 2 mg via INTRAVENOUS
  Filled 2013-06-02: qty 1

## 2013-06-02 MED ORDER — CELECOXIB 200 MG PO CAPS: 200.0000 mg | ORAL_CAPSULE | Freq: Two times a day (BID) | ORAL | Status: DC

## 2013-06-02 MED ORDER — HYDROMORPHONE HCL 2 MG PO TABS
4.0000 mg | ORAL_TABLET | ORAL | Status: DC | PRN
  Filled 2013-06-02: qty 2

## 2013-06-02 MED ORDER — IPRATROPIUM BROMIDE 0.02 % IN SOLN: 0.5000 mg | RESPIRATORY_TRACT | Status: DC | PRN

## 2013-06-02 MED ORDER — IOHEXOL 350 MG/ML SOLN
100.0000 mL | Freq: Once | INTRAVENOUS | Status: AC | PRN
  Administered 2013-06-02: 100 mL via INTRAVENOUS

## 2013-06-02 MED ORDER — SODIUM CHLORIDE 0.9 % IJ SOLN: 9.0000 mL | INTRAMUSCULAR | Status: DC | PRN

## 2013-06-02 MED ORDER — FOLIC ACID 1 MG PO TABS: 1.0000 mg | ORAL_TABLET | Freq: Every day | ORAL | Status: DC

## 2013-06-02 MED ORDER — ACETAMINOPHEN 325 MG PO TABS: 650.0000 mg | ORAL_TABLET | ORAL | Status: DC | PRN

## 2013-06-02 MED ORDER — ONDANSETRON HCL 4 MG/2ML IJ SOLN: 4.0000 mg | Freq: Four times a day (QID) | INTRAMUSCULAR | Status: DC | PRN

## 2013-06-02 MED ORDER — NALOXONE HCL 0.4 MG/ML IJ SOLN: 0.4000 mg | INTRAMUSCULAR | Status: DC | PRN

## 2013-06-02 MED ORDER — DIPHENHYDRAMINE HCL 50 MG/ML IJ SOLN
25.0000 mg | Freq: Once | INTRAMUSCULAR | Status: AC
  Administered 2013-06-02: 25 mg via INTRAVENOUS
  Filled 2013-06-02: qty 1

## 2013-06-02 MED ORDER — PIPERACILLIN-TAZOBACTAM 3.375 G IVPB
3.3750 g | Freq: Once | INTRAVENOUS | Status: DC
Start: 2013-06-02 — End: 2013-06-02

## 2013-06-02 MED ORDER — DIPHENHYDRAMINE HCL 12.5 MG/5ML PO ELIX: 12.5000 mg | ORAL_SOLUTION | Freq: Four times a day (QID) | ORAL | Status: DC | PRN

## 2013-06-02 MED ORDER — SENNA 8.6 MG PO TABS
1.0000 | ORAL_TABLET | Freq: Two times a day (BID) | ORAL | Status: DC
  Administered 2013-06-02 – 2013-06-03 (×2): 8.6 mg via ORAL
  Filled 2013-06-02 (×3): qty 1

## 2013-06-02 MED ORDER — POTASSIUM CHLORIDE CRYS ER 20 MEQ PO TBCR
20.0000 meq | EXTENDED_RELEASE_TABLET | Freq: Every morning | ORAL | Status: DC
  Administered 2013-06-02 – 2013-06-03 (×2): 20 meq via ORAL
  Filled 2013-06-02 (×2): qty 1

## 2013-06-02 MED ORDER — HYDROMORPHONE 0.3 MG/ML IV SOLN
INTRAVENOUS | Status: DC
  Administered 2013-06-02: 17:00:00 via INTRAVENOUS
  Administered 2013-06-02: 5.3 mL via INTRAVENOUS
  Administered 2013-06-03: 4.8 mg via INTRAVENOUS
  Administered 2013-06-03: 0.3 mL via INTRAVENOUS
  Administered 2013-06-03 (×2): via INTRAVENOUS
  Administered 2013-06-03: 3.9 mg via INTRAVENOUS
  Administered 2013-06-03: 4.11 mL via INTRAVENOUS
  Administered 2013-06-04: 4.2 mg via INTRAVENOUS
  Administered 2013-06-04: 4.65 mg via INTRAVENOUS
  Administered 2013-06-04: 4.2 mg via INTRAVENOUS
  Administered 2013-06-04: 0.9 mg via INTRAVENOUS
  Administered 2013-06-04: 2.7 mg via INTRAVENOUS
  Administered 2013-06-04: 17:00:00 via INTRAVENOUS
  Administered 2013-06-04: 4.74 mg via INTRAVENOUS
  Administered 2013-06-04 – 2013-06-05 (×3): via INTRAVENOUS
  Administered 2013-06-05: 5.1 mg via INTRAVENOUS
  Administered 2013-06-05: 14:00:00 via INTRAVENOUS
  Filled 2013-06-02 (×10): qty 25

## 2013-06-02 MED ORDER — PIPERACILLIN-TAZOBACTAM 3.375 G IVPB
3.3750 g | Freq: Three times a day (TID) | INTRAVENOUS | Status: DC
  Administered 2013-06-02 – 2013-06-05 (×10): 3.375 g via INTRAVENOUS
  Filled 2013-06-02 (×13): qty 50

## 2013-06-02 MED ORDER — DIPHENHYDRAMINE HCL 50 MG/ML IJ SOLN: 12.5000 mg | Freq: Four times a day (QID) | INTRAMUSCULAR | Status: DC | PRN

## 2013-06-02 MED ORDER — ACETAMINOPHEN 650 MG RE SUPP: 650.0000 mg | RECTAL | Status: DC | PRN

## 2013-06-02 MED ORDER — HYDROXYUREA 500 MG PO CAPS
1000.0000 mg | ORAL_CAPSULE | Freq: Every day | ORAL | Status: DC
  Administered 2013-06-02 – 2013-06-05 (×4): 1000 mg via ORAL
  Filled 2013-06-02 (×5): qty 2

## 2013-06-02 MED ORDER — DIPHENHYDRAMINE HCL 25 MG PO CAPS
25.0000 mg | ORAL_CAPSULE | ORAL | Status: DC | PRN
  Administered 2013-06-05: 25 mg via ORAL
  Filled 2013-06-02: qty 1
  Filled 2013-06-02: qty 2

## 2013-06-02 NOTE — ED Notes (Signed)
Pt is sitting up in the bed, rocking back and forth, appears drowsy, but arrousable, sts pain is still 10/10. Breakfast tray given, PA made aware of oxygen levels.

## 2013-06-02 NOTE — ED Notes (Signed)
Pt presents short of breath and in pain distress, states symptoms started suddenly this morning. Pt on 3L O2 to help maintain 02 sat above 94%. EKG done.

## 2013-06-02 NOTE — Consult Note (Addendum)
Reason for Consult: elevated troponin Referring Physician: Dr. Mabeline Caras is an 33 y.o. male.  HPI: 33 yo man with PMH of sickle cell disease, anemia, pulmonary embolic on chronic anticoagulation with xarelto and 1-2 admissions per month over the last 3-4 years for sickle cell pain crises who presented with generalized pain and some shortness of breath today. He felt well and was able to attend the [employer] fight last night; however, his pain increased in intensity as bad as 10/10 leading to presentation. He denied specific chest tightness/pain to me but told me he hurt all over, particularly over his abdomen/flank, legs, and arms. He tells me he can walk his dog 1-2 blocks twice daily and he tolerates the 10 steps to his apartment multiple times per day; however, this can make him somewhat short of breath as it did today. He was initiated on pain medication, oxygen and treatment for sickle cell crisis today and initial troponin came back elevated at 1.27 leading to cardiology consultation.  His wbc was 24k and a chest CT was concerning for RLL/RML opacity leading to initiation of vanco/zosyn.        Past Medical History  Diagnosis Date  . Sickle cell anemia   . Blood transfusion   . 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 Hypokalemia   . Mood disorder   . History of pulmonary embolus (PE)   . Avascular necrosis   . Leukocytosis     Chronic  . Thrombocytosis     Chronic  . Hypertension   . History of Clostridium difficile infection   . Uses marijuana   . Chronic anticoagulation   . Functional asplenia   . Former smoker   . Second hand tobacco smoke exposure   . Alcohol consumption of one to four drinks per day     Past Surgical History  Procedure Laterality Date  . Right hip replacement      08/2006  . Cholecystectomy      01/2008  . Porta cath placement    . Porta cath removal    . Umbilical hernia repair      01/2008    . Excision of left periauricular cyst      10/2009  . Excision of right ear lobe cyst with primary closur      11/2007  . Portacath placement  01/05/2012    Procedure: INSERTION PORT-A-CATH;  Surgeon: Odis Hollingshead, MD;  Location: [facility];  Service: General;  Laterality: N/A;  ultrasound guiced port a cath insertion with fluoroscopy    Family History  Problem Relation Age of Onset  . Sickle cell anemia Mother   . Sickle cell anemia Father   . Sickle cell trait Brother     Social History:  reports that he quit smoking about 2 years ago. His smoking use included Cigarettes. He smoked 0.00 packs per day for 13 years. He has never used smokeless tobacco. He reports that he drinks about 6 ounces of alcohol per week. He reports that he uses illicit drugs (Marijuana).  Allergies:  Allergies  Allergen Reactions  . Morphine And Related Hives and Rash    Pt states he can take PO, but not IV and he is able to tolerate Dilaudid with no reactions.    Medications:  I have reviewed the patient's current medications. Prior to Admission:  Prescriptions prior to admission  Medication Sig Dispense Refill  . celecoxib (CELEBREX) 200 MG capsule Take  1 capsule (200 mg total) by mouth 2 (two) times daily.  60 capsule  1  . ergocalciferol (VITAMIN D2) 50000 UNITS capsule Take 50,000 Units by mouth once a week. On Tuesday      . folic acid (FOLVITE) 1 MG tablet Take 1 tablet (1 mg total) by mouth every morning.  30 tablet  11  . HYDROmorphone (DILAUDID) 4 MG tablet Take 1 tablet (4 mg total) by mouth every 4 (four) hours as needed for severe pain.  90 tablet  0  . hydroxyurea (HYDREA) 500 MG capsule Take 1,000 mg by mouth daily. May take with food to minimize GI side effects.      Marland Kitchen morphine (MS CONTIN) 15 MG 12 hr tablet Take 1 tablet (15 mg total) by mouth 2 (two) times daily.  60 tablet  0  . potassium chloride SA (K-DUR,KLOR-CON) 20 MEQ tablet Take 1 tablet (20 mEq total) by mouth every morning.  30  tablet  3  . Rivaroxaban (XARELTO) 20 MG TABS tablet Take 20 mg by mouth daily with supper.      . zolpidem (AMBIEN) 10 MG tablet Take 1 tablet (10 mg total) by mouth at bedtime as needed for sleep.  30 tablet  0   Scheduled: . folic acid  1 mg Oral q morning - 10a  . HYDROmorphone PCA 0.3 mg/mL   Intravenous 6 times per day  . hydroxyurea  1,000 mg Oral Daily  . ipratropium-albuterol  3 mL Nebulization Q4H  . ketorolac  30 mg Intravenous 4 times per day  . morphine  15 mg Oral BID  . piperacillin-tazobactam (ZOSYN)  IV  3.375 g Intravenous 3 times per day  . potassium chloride SA  20 mEq Oral q morning - 10a  . rivaroxaban  20 mg Oral Q supper  . senna  1 tablet Oral q12n4p  . vancomycin  1,250 mg Intravenous Q8H    Results for orders placed during the hospital encounter of 06/02/13 (from the past 48 hour(s))  CBC WITH DIFFERENTIAL     Status: Abnormal   Collection Time    06/02/13  6:20 AM      Result Value Ref Range   WBC 27.7 (*) 4.0 - 10.5 K/uL   RBC 2.65 (*) 4.22 - 5.81 MIL/uL   Hemoglobin 8.5 (*) 13.0 - 17.0 g/dL   HCT 23.9 (*) 39.0 - 52.0 %   MCV 90.2  78.0 - 100.0 fL   MCH 32.1  26.0 - 34.0 pg   MCHC 35.6  30.0 - 36.0 g/dL   RDW 22.0 (*) 11.5 - 15.5 %   Platelets 515 (*) 150 - 400 K/uL   Neutrophils Relative % 73  43 - 77 %   Lymphocytes Relative 14  12 - 46 %   Monocytes Relative 11  3 - 12 %   Eosinophils Relative 1  0 - 5 %   Basophils Relative 1  0 - 1 %   Neutro Abs 20.2 (*) 1.7 - 7.7 K/uL   Lymphs Abs 3.9  0.7 - 4.0 K/uL   Monocytes Absolute 3.0 (*) 0.1 - 1.0 K/uL   Eosinophils Absolute 0.3  0.0 - 0.7 K/uL   Basophils Absolute 0.3 (*) 0.0 - 0.1 K/uL   RBC Morphology RARE NRBCs     Comment: POLYCHROMASIA PRESENT     SICKLE CELLS     TARGET CELLS   WBC Morphology WHITE COUNT CONFIRMED ON SMEAR    COMPREHENSIVE METABOLIC PANEL  Status: Abnormal   Collection Time    06/02/13  6:20 AM      Result Value Ref Range   Sodium 142  137 - 147 mEq/L    Potassium 3.2 (*) 3.7 - 5.3 mEq/L   Chloride 104  96 - 112 mEq/L   CO2 21  19 - 32 mEq/L   Glucose, Bld 181 (*) 70 - 99 mg/dL   BUN 5 (*) 6 - 23 mg/dL   Creatinine, Ser 0.54  0.50 - 1.35 mg/dL   Calcium 9.0  8.4 - 10.5 mg/dL   Total Protein 7.9  6.0 - 8.3 g/dL   Albumin 4.2  3.5 - 5.2 g/dL   AST 47 (*) 0 - 37 U/L   ALT 20  0 - 53 U/L   Alkaline Phosphatase 95  39 - 117 U/L   Total Bilirubin 7.0 (*) 0.3 - 1.2 mg/dL   GFR calc non Af Amer >90  >90 mL/min   GFR calc Af Amer >90  >90 mL/min   Comment: (NOTE)     The eGFR has been calculated using the CKD EPI equation.     This calculation has not been validated in all clinical situations.     eGFR's persistently <90 mL/min signify possible Chronic Kidney     Disease.  RETICULOCYTES     Status: Abnormal   Collection Time    06/02/13  6:20 AM      Result Value Ref Range   Retic Ct Pct >23.0 (*) 0.4 - 3.1 %   Comment: RESULTS CONFIRMED BY MANUAL DILUTION   RBC. 2.65 (*) 4.22 - 5.81 MIL/uL   Retic Count, Manual NOT CALCULATED  19.0 - 186.0 K/uL  URINE RAPID DRUG SCREEN (HOSP PERFORMED)     Status: Abnormal   Collection Time    06/02/13  1:48 PM      Result Value Ref Range   Opiates POSITIVE (*) NONE DETECTED   Cocaine NONE DETECTED  NONE DETECTED   Benzodiazepines NONE DETECTED  NONE DETECTED   Amphetamines NONE DETECTED  NONE DETECTED   Tetrahydrocannabinol POSITIVE (*) NONE DETECTED   Barbiturates NONE DETECTED  NONE DETECTED   Comment:            DRUG SCREEN FOR MEDICAL PURPOSES     ONLY.  IF CONFIRMATION IS NEEDED     FOR ANY PURPOSE, NOTIFY LAB     WITHIN 5 DAYS.                LOWEST DETECTABLE LIMITS     FOR URINE DRUG SCREEN     Drug Class       Cutoff (ng/mL)     Amphetamine      1000     Barbiturate      200     Benzodiazepine   659     Tricyclics       935     Opiates          300     Cocaine          300     THC              50  BLOOD GAS, ARTERIAL     Status: Abnormal   Collection Time    06/02/13  2:03 PM       Result Value Ref Range   O2 Content 4.0     Delivery systems NASAL CANNULA     pH, Arterial 7.386  7.350 -  7.450   pCO2 arterial 35.5  35.0 - 45.0 mmHg   pO2, Arterial 89.4  80.0 - 100.0 mmHg   Bicarbonate 20.8  20.0 - 24.0 mEq/L   TCO2 19.9  0 - 100 mmol/L   Acid-base deficit 3.3 (*) 0.0 - 2.0 mmol/L   O2 Saturation 94.2     Patient temperature 98.6     Collection site RIGHT RADIAL     Drawn by [phone]     Sample type ARTERIAL DRAW     Allens test (pass/fail) PASS  PASS  LACTATE DEHYDROGENASE     Status: Abnormal   Collection Time    06/02/13  3:25 PM      Result Value Ref Range   LDH 473 (*) 94 - 250 U/L  ETHANOL     Status: None   Collection Time    06/02/13  3:25 PM      Result Value Ref Range   Alcohol, Ethyl (B) <11  0 - 11 mg/dL   Comment:            LOWEST DETECTABLE LIMIT FOR     SERUM ALCOHOL IS 11 mg/dL     FOR MEDICAL PURPOSES ONLY  COMPREHENSIVE METABOLIC PANEL     Status: Abnormal   Collection Time    06/02/13  3:25 PM      Result Value Ref Range   Sodium 141  137 - 147 mEq/L   Potassium 3.2 (*) 3.7 - 5.3 mEq/L   Chloride 107  96 - 112 mEq/L   CO2 21  19 - 32 mEq/L   Glucose, Bld 97  70 - 99 mg/dL   BUN 6  6 - 23 mg/dL   Creatinine, Ser 0.60  0.50 - 1.35 mg/dL   Calcium 8.3 (*) 8.4 - 10.5 mg/dL   Total Protein 6.6  6.0 - 8.3 g/dL   Albumin 3.6  3.5 - 5.2 g/dL   AST 33  0 - 37 U/L   ALT 17  0 - 53 U/L   Alkaline Phosphatase 80  39 - 117 U/L   Total Bilirubin 5.3 (*) 0.3 - 1.2 mg/dL   GFR calc non Af Amer >90  >90 mL/min   GFR calc Af Amer >90  >90 mL/min   Comment: (NOTE)     The eGFR has been calculated using the CKD EPI equation.     This calculation has not been validated in all clinical situations.     eGFR's persistently <90 mL/min signify possible Chronic Kidney     Disease.  MAGNESIUM     Status: None   Collection Time    06/02/13  3:25 PM      Result Value Ref Range   Magnesium 1.7  1.5 - 2.5 mg/dL  CBC WITH DIFFERENTIAL     Status:  Abnormal   Collection Time    06/02/13  3:25 PM      Result Value Ref Range   WBC 24.1 (*) 4.0 - 10.5 K/uL   RBC 2.39 (*) 4.22 - 5.81 MIL/uL   Hemoglobin 7.7 (*) 13.0 - 17.0 g/dL   HCT 22.2 (*) 39.0 - 52.0 %   MCV 92.9  78.0 - 100.0 fL   MCH 32.2  26.0 - 34.0 pg   MCHC 34.7  30.0 - 36.0 g/dL   RDW 21.9 (*) 11.5 - 15.5 %   Platelets 451 (*) 150 - 400 K/uL   Neutrophils Relative % 72  43 - 77 %   Lymphocytes  Relative 13  12 - 46 %   Monocytes Relative 14 (*) 3 - 12 %   Eosinophils Relative 1  0 - 5 %   Basophils Relative 0  0 - 1 %   Neutro Abs 17.4 (*) 1.7 - 7.7 K/uL   Lymphs Abs 3.1  0.7 - 4.0 K/uL   Monocytes Absolute 3.4 (*) 0.1 - 1.0 K/uL   Eosinophils Absolute 0.2  0.0 - 0.7 K/uL   Basophils Absolute 0.0  0.0 - 0.1 K/uL   RBC Morphology RARE NRBCs     Comment: POLYCHROMASIA PRESENT     TARGET CELLS     SICKLE CELLS   Smear Review LARGE PLATELETS PRESENT    RETICULOCYTES     Status: Abnormal   Collection Time    06/02/13  3:25 PM      Result Value Ref Range   Retic Ct Pct >23.0 (*) 0.4 - 3.1 %   Comment: RESULTS CONFIRMED BY MANUAL DILUTION   RBC. 2.39 (*) 4.22 - 5.81 MIL/uL   Retic Count, Manual NOT CALCULATED  19.0 - 186.0 K/uL  TROPONIN I     Status: Abnormal   Collection Time    06/02/13  3:25 PM      Result Value Ref Range   Troponin I 1.27 (*) <0.30 ng/mL   Comment:            Due to the release kinetics of cTnI,     a negative result within the first hours     of the onset of symptoms does not rule out     myocardial infarction with certainty.     If myocardial infarction is still suspected,     repeat the test at appropriate intervals.     CRITICAL RESULT CALLED TO, READ BACK BY AND VERIFIED WITH:     GILLIAM,M AT 1615 ON [id number] BY HOOKER,B  MRSA PCR SCREENING     Status: None   Collection Time    06/02/13  3:33 PM      Result Value Ref Range   MRSA by PCR NEGATIVE  NEGATIVE   Comment:            The GeneXpert MRSA Assay (FDA     approved for NASAL  specimens     only), is one component of a     comprehensive MRSA colonization     surveillance program. It is not     intended to diagnose MRSA     infection nor to guide or     monitor treatment for     MRSA infections.  URINALYSIS, ROUTINE W REFLEX MICROSCOPIC     Status: Abnormal   Collection Time    06/02/13  7:06 PM      Result Value Ref Range   Color, Urine YELLOW  YELLOW   APPearance CLEAR  CLEAR   Specific Gravity, Urine >1.046 (*) 1.005 - 1.030   pH 7.0  5.0 - 8.0   Glucose, UA NEGATIVE  NEGATIVE mg/dL   Hgb urine dipstick NEGATIVE  NEGATIVE   Bilirubin Urine NEGATIVE  NEGATIVE   Ketones, ur NEGATIVE  NEGATIVE mg/dL   Protein, ur NEGATIVE  NEGATIVE mg/dL   Urobilinogen, UA 1.0  0.0 - 1.0 mg/dL   Nitrite NEGATIVE  NEGATIVE   Leukocytes, UA NEGATIVE  NEGATIVE   Comment: MICROSCOPIC NOT DONE ON URINES WITH NEGATIVE PROTEIN, BLOOD, LEUKOCYTES, NITRITE, OR GLUCOSE <1000 mg/dL.    Dg Chest 2 View  06/02/2013  CLINICAL DATA:  Sickle cell crisis.  Shortness of breath.  EXAM: CHEST  2 VIEW  COMPARISON:  05/14/2013  FINDINGS: Left chest wall port a catheter is noted with tip in the cavoatrial junction. The heart size is enlarged. The lung volumes are low and there is scarring within both lung bases. No pleural effusions or edema. No airspace consolidation.  IMPRESSION: 1. Low lung volumes with bibasilar scarring.  . 2. Cardiac enlargement.   Electronically Signed   By: Kerby Moors M.D.   On: 06/02/2013 09:36   Ct Angio Chest Pe W/cm &/or Wo Cm  06/02/2013   CLINICAL DATA:  Shortness of breath, chest pain, hypoxia, tachycardia, history of sickle cell anemia, evaluate for pulmonary embolism  EXAM: CT ANGIOGRAPHY CHEST WITH CONTRAST  TECHNIQUE: Multidetector CT imaging of the chest was performed using the standard protocol during bolus administration of intravenous contrast. Multiplanar CT image reconstructions and MIPs were obtained to evaluate the vascular anatomy.  CONTRAST:  171m  OMNIPAQUE IOHEXOL 350 MG/ML SOLN  COMPARISON:  CT ANGIO CHEST W/CM &/OR WO/CM dated 05/18/2013; CT ANGIO CHEST W/CM &/OR WO/CM dated 03/26/2013; CT ANGIO CHEST W/CM &/OR WO/CM dated 11/13/2012; CT ANGIO CHEST W/CM &/OR WO/CM dated 08/27/2012  FINDINGS: Vascular Findings:  There is suboptimal adequate opacification of the pulmonary arterial system with the main pulmonary artery measuring only 187 Hounsfield units. Given this limitation, there are no discrete filling defects within the pulmonary arterial tree to the level of the bilateral subsegmental pulmonary arteries. Evaluation of distal subsegmental pulmonary arteries is degraded secondary to suboptimal vessel opacification and patient respiratory artifact. There is grossly unchanged marked enlargement of the main pulmonary artery measuring approximately 38 mm in diameter.  Cardiomegaly. No pericardial effusion. Normal caliber of the thoracic aorta. A Bentson configuration of the aortic arch. Branch vessels of the aortic arch widely patent throughout their imaged course. No definite thoracic aortic dissection or periaortic stranding.  There is a left anterior chest wall subclavian vein approach port a catheter with tip terminating within the superior aspect of the right atrium.  Review of the MIP images confirms the above findings.   ----------------------------------------------------------------------------------  Nonvascular Findings:  Evaluation of the pulmonary parenchyma is degraded secondary to patient respiratory artifact.  Interval development / worsening of a ill-defined potentially partially cavitary approximately 2.0 x 1.4 cm opacity within the peripheral aspect of the superior segment of the right lower lobe (image 67, series 9). There is an additional ill-defined area of ground-glass within the subpleural aspect of the right lower lobe (image 65, series 9).  There is grossly symmetric subpleural dependent subsegmental atelectasis within the bilateral  lower lobes and caudal segment of the lingula. No pleural effusion or pneumothorax. The central pulmonary airways appear widely patent. Shotty prevascular lymph nodes are individually not enlarged by size criteria with index prevascular lymph node measuring approximately 0.9 cm in greatest short axis diameter (image 46, series 6). No mediastinal, hilar or axillary lymphadenopathy.  Early arterial phase evaluation of the upper abdomen is normal.  No definite acute or aggressive osseous abnormalities. Accentuated kyphosis within in the superior aspect of the thoracic spine, likely positional. Stigmata of sickle cell disease affecting the imaged osseous structures of the chest including mild diffuse increased slightly mottled sclerosis and fish-mouth deformities about multiple intervertebral disc spaces within in the mid cranial and mid aspects of the thoracic spine.  Thyroid gland appears normal given obliquity. Regional soft tissues appear normal.  IMPRESSION: 1. No evidence of pulmonary embolism to  the level of the bilateral subsegmental pulmonary arteries. 2. Interval development of airspace opacities within the right lower and to a lesser extent, the right middle lobes, worrisome for developing areas of infection and/or acute chest syndrome in this patient with known sickle cell disease. 3. Cardiomegaly with enlargement of the caliber of the main pulmonary artery, nonspecific though could be seen in the setting of pulmonary arterial hypertension. Further evaluation could be performed with cardiac echo as clinically indicated.   Electronically Signed   By: Sandi Mariscal M.D.   On: 06/02/2013 12:02    Review of Systems  Constitutional: Positive for malaise/fatigue. Negative for fever and chills.  HENT: Negative for nosebleeds.   Eyes: Negative for blurred vision and pain.  Respiratory: Positive for shortness of breath. Negative for cough and sputum production.   Cardiovascular: Negative for chest pain and  claudication.  Gastrointestinal: Negative for vomiting, diarrhea and constipation.  Genitourinary: Positive for flank pain. Negative for dysuria and hematuria.  Musculoskeletal: Positive for myalgias.  Skin: Positive for itching. Negative for rash.  Neurological: Negative for dizziness, sensory change and headaches.  Psychiatric/Behavioral: Negative for suicidal ideas, hallucinations and substance abuse.   Blood pressure 142/81, pulse 89, temperature 99.2 F (37.3 C), temperature source Oral, resp. rate 18, height 6' (1.829 m), weight 76.4 kg (168 lb 6.9 oz), SpO2 95.00%. Physical Exam  Nursing note and vitals reviewed. Constitutional: He is oriented to person, place, and time. He appears well-developed and well-nourished. He appears distressed.  HENT:  Head: Normocephalic and atraumatic.  Nose: Nose normal.  Mouth/Throat: Oropharynx is clear and moist. No oropharyngeal exudate.  Eyes: EOM are normal. Pupils are equal, round, and reactive to light. Right eye exhibits no discharge. Left eye exhibits no discharge.  Neck: Neck supple. No JVD present. No tracheal deviation present. No thyromegaly present.  Cardiovascular: Regular rhythm, normal heart sounds and intact distal pulses.  Exam reveals no gallop.   No murmur heard. tachycardic  Respiratory: Effort normal. No respiratory distress. He has rales.  GI: Soft. Bowel sounds are normal. He exhibits no distension. There is no tenderness. There is no rebound.  Musculoskeletal: Normal range of motion. He exhibits no edema and no tenderness.  Neurological: He is alert and oriented to person, place, and time. No cranial nerve deficit. Coordination normal.  Skin: Skin is warm and dry. No rash noted. He is not diaphoretic. No erythema.  Psychiatric: He has a normal mood and affect. His behavior is normal. Thought content normal.   Labs reviewed; na 141, K 3.2, bun/cr 6/0.6 Tro 1.27, Total bili 5.3, ast/alt 33/17, ldh 473 retic 23% Wbc 24k,  h/h 7.2/22.2, plt 451 CTA reviewed as above EKG: Sinus tachycardia, significant TWI V2-V4 more pronounced than baseline with early repolarization but similar morphology, prolonged QT   Problem List Sickle Cell Crisis  Elevated Troponin  Anemia Elevated Total Bili  Elevated LDH Leukocytosis  Sinus tachycardia Chronic Anticoagulation on xarelto for PE Assessment/Plan: 33 yo man with PMH of sickle cell disease, chronic anticoagulation for PE found to have elevated troponin in setting of sickle cell crisis. Mr. Yan has no current chest pain. He does/did have diffuse pain. He currently feels well, no syncope at home. No change in exercise capacity chronically, more SOB with sickle cell crisis. Differential diagnosis for elevated troponin is PE, ACS/MI, type II NSTEMI among other etiologies. I favor a diagnosis of Type II NSTEMI in setting of supply/demand mismatch with significant anemia of sickle cell pain crisis. For now,  would trend cardiac markers, continue telemetry, consider transfusion for h/h > 8/24. If cardiac markers trend up significantly, will consider transitioning xarelto to heparin. For now would continue. Will also obtain echocardiogram in AM. His ECG is concerning for Wellen's warning sign which can be consistent with proximal LAD disease.  - echocardiogram in AM - continue xarelto for PE unless troponins trend up in AM, then would consider transition to heparin  - consider transfusion for h/h > /24 - treat sickle cell/pain crisis  - trend cardiac markers, telemetry  Jules Husbands 06/02/2013, 9:46 PM

## 2013-06-02 NOTE — ED Notes (Signed)
Attempted to call report, RN requested to call back when she is available to take report

## 2013-06-02 NOTE — ED Provider Notes (Signed)
CSN: [id number]     Arrival date & time 06/02/13  0600 History   None    Chief Complaint  Patient presents with  . Sickle Cell Pain Crisis  . Shortness of Breath     (Consider location/radiation/quality/duration/timing/severity/associated sxs/prior Treatment) HPI History provided by pt.   Pt presents w/ sickle cell crisis.  Felt well before going to bed last night but woke w/ severe pain this morning.  Pain "all over", including bilateral rib cage.  No associated fever/SOB.  No recent illnesses or trauma.  Has taken his MS contin and dilaudid w/out relief.  Past Medical History  Diagnosis Date  . Sickle cell anemia   . Blood transfusion   . 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 Hypokalemia   . Mood disorder   . History of pulmonary embolus (PE)   . Avascular necrosis   . Leukocytosis     Chronic  . Thrombocytosis     Chronic  . Hypertension   . History of Clostridium difficile infection   . Uses marijuana   . Chronic anticoagulation   . Functional asplenia   . Former smoker   . Second hand tobacco smoke exposure   . Alcohol consumption of one to four drinks per day    Past Surgical History  Procedure Laterality Date  . Right hip replacement      08/2006  . Cholecystectomy      01/2008  . Porta cath placement    . Porta cath removal    . Umbilical hernia repair      01/2008  . Excision of left periauricular cyst      10/2009  . Excision of right ear lobe cyst with primary closur      11/2007  . Portacath placement  01/05/2012    Procedure: INSERTION PORT-A-CATH;  Surgeon: Odis Hollingshead, MD;  Location: [place];  Service: General;  Laterality: N/A;  ultrasound guiced port a cath insertion with fluoroscopy   Family History  Problem Relation Age of Onset  . Sickle cell anemia Mother   . Sickle cell anemia Father   . Sickle cell trait Brother    History  Substance Use Topics  . Smoking status: Former Smoker -- 13 years    Types:  Cigarettes    Quit date: 07/08/2010  . Smokeless tobacco: Never Used  . Alcohol Use: 6.0 oz/week    10 Shots of liquor per week    Review of Systems  All other systems reviewed and are negative.     Allergies  Morphine and related  Home Medications   Prior to Admission medications   Medication Sig Start Date End Date Taking? Authorizing Provider  celecoxib (CELEBREX) 200 MG capsule Take 1 capsule (200 mg total) by mouth 2 (two) times daily. 03/30/13  Yes Elwyn Reach, MD  ergocalciferol (VITAMIN D2) 50000 UNITS capsule Take 50,000 Units by mouth once a week. On Tuesday 04/22/13  Yes Dorena Dew, FNP  folic acid (FOLVITE) 1 MG tablet Take 1 tablet (1 mg total) by mouth every morning. 06/05/12  Yes [name], MD  HYDROmorphone (DILAUDID) 4 MG tablet Take 1 tablet (4 mg total) by mouth every 4 (four) hours as needed for severe pain. 05/23/13  Yes Dorena Dew, FNP  hydroxyurea (HYDREA) 500 MG capsule Take 1,000 mg by mouth daily. May take with food to minimize GI side effects. 03/30/13  Yes Elwyn Reach, MD  morphine (MS  CONTIN) 15 MG 12 hr tablet Take 1 tablet (15 mg total) by mouth 2 (two) times daily. 05/23/13  Yes Dorena Dew, FNP  potassium chloride SA (K-DUR,KLOR-CON) 20 MEQ tablet Take 1 tablet (20 mEq total) by mouth every morning. 03/30/13  Yes Elwyn Reach, MD  Rivaroxaban (XARELTO) 20 MG TABS tablet Take 20 mg by mouth daily with supper.   Yes Historical Provider, MD  zolpidem (AMBIEN) 10 MG tablet Take 1 tablet (10 mg total) by mouth at bedtime as needed for sleep. 04/24/13  Yes Dorena Dew, FNP   BP 141/105  Pulse 109  Temp(Src) 97.8 F (36.6 C) (Oral)  Resp 23  SpO2 95% Physical Exam  Nursing note and vitals reviewed. Constitutional: He is oriented to person, place, and time. He appears well-developed and well-nourished. No distress.  Drowsy and uncomfortable appearing  HENT:  Head: Normocephalic and atraumatic.  Eyes:  Normal  appearance  Neck: Normal range of motion.  Cardiovascular: Normal rate and regular rhythm.   Pulmonary/Chest: Effort normal and breath sounds normal. No respiratory distress.  O2 sat 95%  Musculoskeletal: Normal range of motion.  Neurological: He is alert and oriented to person, place, and time.  Skin: Skin is warm and dry. No rash noted.  Psychiatric: He has a normal mood and affect. His behavior is normal.    ED Course  Procedures (including critical care time) Labs Review Labs Reviewed  CBC WITH DIFFERENTIAL - Abnormal; Notable for the following:    WBC 27.7 (*)    RBC 2.65 (*)    Hemoglobin 8.5 (*)    HCT 23.9 (*)    RDW 22.0 (*)    Platelets 515 (*)    Neutro Abs 20.2 (*)    Monocytes Absolute 3.0 (*)    Basophils Absolute 0.3 (*)    All other components within normal limits  COMPREHENSIVE METABOLIC PANEL - Abnormal; Notable for the following:    Potassium 3.2 (*)    Glucose, Bld 181 (*)    BUN 5 (*)    AST 47 (*)    Total Bilirubin 7.0 (*)    All other components within normal limits  RETICULOCYTES - Abnormal; Notable for the following:    Retic Ct Pct >23.0 (*)    RBC. 2.65 (*)    All other components within normal limits    Imaging Review Dg Chest 2 View  06/02/2013   CLINICAL DATA:  Sickle cell crisis.  Shortness of breath.  EXAM: CHEST  2 VIEW  COMPARISON:  05/14/2013  FINDINGS: Left chest wall port a catheter is noted with tip in the cavoatrial junction. The heart size is enlarged. The lung volumes are low and there is scarring within both lung bases. No pleural effusions or edema. No airspace consolidation.  IMPRESSION: 1. Low lung volumes with bibasilar scarring.  . 2. Cardiac enlargement.   Electronically Signed   By: Kerby Moors M.D.   On: 06/02/2013 09:36   Ct Angio Chest Pe W/cm &/or Wo Cm  06/02/2013   CLINICAL DATA:  Shortness of breath, chest pain, hypoxia, tachycardia, history of sickle cell anemia, evaluate for pulmonary embolism  EXAM: CT  ANGIOGRAPHY CHEST WITH CONTRAST  TECHNIQUE: Multidetector CT imaging of the chest was performed using the standard protocol during bolus administration of intravenous contrast. Multiplanar CT image reconstructions and MIPs were obtained to evaluate the vascular anatomy.  CONTRAST:  163mL OMNIPAQUE IOHEXOL 350 MG/ML SOLN  COMPARISON:  CT ANGIO CHEST W/CM &/OR WO/CM  dated 05/18/2013; CT ANGIO CHEST W/CM &/OR WO/CM dated 03/26/2013; CT ANGIO CHEST W/CM &/OR WO/CM dated 11/13/2012; CT ANGIO CHEST W/CM &/OR WO/CM dated 08/27/2012  FINDINGS: Vascular Findings:  There is suboptimal adequate opacification of the pulmonary arterial system with the main pulmonary artery measuring only 187 Hounsfield units. Given this limitation, there are no discrete filling defects within the pulmonary arterial tree to the level of the bilateral subsegmental pulmonary arteries. Evaluation of distal subsegmental pulmonary arteries is degraded secondary to suboptimal vessel opacification and patient respiratory artifact. There is grossly unchanged marked enlargement of the main pulmonary artery measuring approximately 38 mm in diameter.  Cardiomegaly. No pericardial effusion. Normal caliber of the thoracic aorta. A Bentson configuration of the aortic arch. Branch vessels of the aortic arch widely patent throughout their imaged course. No definite thoracic aortic dissection or periaortic stranding.  There is a left anterior chest wall subclavian vein approach port a catheter with tip terminating within the superior aspect of the right atrium.  Review of the MIP images confirms the above findings.   ----------------------------------------------------------------------------------  Nonvascular Findings:  Evaluation of the pulmonary parenchyma is degraded secondary to patient respiratory artifact.  Interval development / worsening of a ill-defined potentially partially cavitary approximately 2.0 x 1.4 cm opacity within the peripheral aspect of the  superior segment of the right lower lobe (image 67, series 9). There is an additional ill-defined area of ground-glass within the subpleural aspect of the right lower lobe (image 65, series 9).  There is grossly symmetric subpleural dependent subsegmental atelectasis within the bilateral lower lobes and caudal segment of the lingula. No pleural effusion or pneumothorax. The central pulmonary airways appear widely patent. Shotty prevascular lymph nodes are individually not enlarged by size criteria with index prevascular lymph node measuring approximately 0.9 cm in greatest short axis diameter (image 46, series 6). No mediastinal, hilar or axillary lymphadenopathy.  Early arterial phase evaluation of the upper abdomen is normal.  No definite acute or aggressive osseous abnormalities. Accentuated kyphosis within in the superior aspect of the thoracic spine, likely positional. Stigmata of sickle cell disease affecting the imaged osseous structures of the chest including mild diffuse increased slightly mottled sclerosis and fish-mouth deformities about multiple intervertebral disc spaces within in the mid cranial and mid aspects of the thoracic spine.  Thyroid gland appears normal given obliquity. Regional soft tissues appear normal.  IMPRESSION: 1. No evidence of pulmonary embolism to the level of the bilateral subsegmental pulmonary arteries. 2. Interval development of airspace opacities within the right lower and to a lesser extent, the right middle lobes, worrisome for developing areas of infection and/or acute chest syndrome in this patient with known sickle cell disease. 3. Cardiomegaly with enlargement of the caliber of the main pulmonary artery, nonspecific though could be seen in the setting of pulmonary arterial hypertension. Further evaluation could be performed with cardiac echo as clinically indicated.   Electronically Signed   By: Sandi Mariscal M.D.   On: 06/02/2013 12:02     EKG  Interpretation   Date/Time:  Sunday Jun 02 2013 06:13:53 EDT Ventricular Rate:  110 PR Interval:  172 QRS Duration: 108 QT Interval:  387 QTC Calculation: 524 R Axis:   -69 Text Interpretation:  Sinus tachycardia Left anterior fascicular block  Nonspecific ST and T wave abnormality Prolonged QT interval Confirmed by  OPITZ  MD, BRIAN ([id number]) on 06/02/2013 6:22:45 AM      MDM   Final diagnoses:  Sickle cell pain crisis  Acute  chest syndrome  Hypoxia   33yo M presents w/ sickle cell pain crisis.  Pain diffuse but including rib cage.  No fever or SOB.  Compliant w/ xarelto. On exam, drowsy, uncomfortable appearing, afebrile, tachycardic, hypoxic (93% on 3L [place]), no respiratory distress, nml breath sounds, no LE edema or calf ttp.  Pt has received a total of 3mg  dilaudid + toradol and IVF.  Pain has not improved and VS stable.  O2 on room air 87%.  CXR to r/o pna and acute chest pending.  Fentanyl ordered for pain. 8:53 AM   VSS.  Pt requests another dose of pain medication.  Pain most prominent bilateral lateral rib cage.  CXR neg.  CTA chest ordered and pending.  Pt to receive anther 173mcg fentanyl. 10:14 AM   Pt has received multiple doses of fentanyl w/ minimal relief.  HR improved but VS otherwise stable.  CTA chest shows opacities in R middle and lower lobes, concerning for pna or acute chest.  Results discussed w/ pt.  [facility] for admission.    Remer Macho, PA-C 06/02/13 [phone]

## 2013-06-02 NOTE — H&P (Signed)
[facility] History and Physical  [name] ION:[id number] DOB: 01-06-1981 DOA: 06/02/2013  Referring physician: ER physician PCP: [name],[name], MD   Chief Complaint: pain  HPI:  33 year old male with past medical history of sickle cell disease and related anemia, pulmonary embolism on anticoagulation with xarelto, frequent admissions for sickle cell pain crises who presented to [facility] ED 06/02/2013 with ongoing intractable generalized pain and associated shortness of breath that started this am. Pt reported his home medications this not relieve the pain. Pain is generalized and is 10/10 in intensity. Pain is worse with moving around however it is also presented at rest. He had some chest pain as well in mid chest, 7/10 in intensity, non radiating and also associated with shortness of breath but no cough. No reports of fevers at home. No abdominal pain, nausea or vomiting. In ED, BP was 141/105, HR 109-120, T max 98 F and oxygen saturation 87% on room air. His blood work was significant for WBC count of 27.7, hemoglobin 8.5 and potassium of 3.2. CT angio chest showed no evidence of pulmonary embolism but there is an interval development of airspace opacities within the right lower and to a lesser extent, the right middle lobes, worrisome for developing areas of infection and/or acute chest syndrome. CXR showed low lung volumes with basilar scarring. Pt was started on vanco and zosyn and admitted to SDU.    Assessment and Plan:  Principal Problem:   Acute respiratory failure with hypoxia - possible due to acute chest syndrome, HCAP - admission to SDU - we started nebulizer treatments (duoneb every 4 hours scheduled and then every 4 hours as needed), antibiotics (vanco and zosyn) and oxygen support via nasal canula to keep O2 saturation above 90% - pain management is with home analgesics as well as dilaudid 4 mg IV every 2 hours as needed and ketorolac per pharmacy dosing Active  Problems:   Sickle-cell crisis with associated acute chest syndrome - cycle cardiac enzymes for total of 3 sets - obtian 2 D ECHO - the 12 lead EKG shows sinus tachycardia - pain management as indicated above    HCAP (healthcare-associated pneumonia) - started vanco and zosyn for broad spectrum coverage - follow up blood culture results    Hx of pulmonary embolus - continue xarelto   Leukocytosis, unspecified - likely due to acute chest syndrome, HCAP - management as above    Hypokalemia - on potassium supplementation at home   Radiological Exams on Admission: Dg Chest 2 View 06/02/2013     IMPRESSION: 1. Low lung volumes with bibasilar scarring.  . 2. Cardiac enlargement.     Ct Angio Chest Pe W/cm &/or Wo Cm 06/02/2013    IMPRESSION: 1. No evidence of pulmonary embolism to the level of the bilateral subsegmental pulmonary arteries. 2. Interval development of airspace opacities within the right lower and to a lesser extent, the right middle lobes, worrisome for developing areas of infection and/or acute chest syndrome in this patient with known sickle cell disease. 3. Cardiomegaly with enlargement of the caliber of the main pulmonary artery, nonspecific though could be seen in the setting of pulmonary arterial hypertension.   Code Status: Full Family Communication: Pt at bedside Disposition Plan: Admit for further evaluation  Robbie Lis, MD  [facility] Pager [phone]  Review of Systems:  Constitutional: Negative for fever, chills and malaise/fatigue. Negative for diaphoresis.  HENT: Negative for hearing loss, ear pain, nosebleeds, congestion, sore throat, neck pain, tinnitus  and ear discharge.   Eyes: Negative for blurred vision, double vision, photophobia, pain, discharge and redness.  Respiratory: Negative for cough, hemoptysis, sputum production, positive for shortness of breath, no wheezing and stridor.   Cardiovascular: Negative for chest pain, palpitations,  orthopnea, claudication and leg swelling.  Gastrointestinal: Negative for nausea, vomiting and abdominal pain. Negative for heartburn, constipation, blood in stool and melena.  Genitourinary: Negative for dysuria, urgency, frequency, hematuria and flank pain.  Musculoskeletal: per HPI  Skin: Negative for itching and rash.  Neurological: Negative for dizziness and weakness. Negative for tingling, tremors, sensory change, speech change, focal weakness, loss of consciousness and headaches.  Endo/Heme/Allergies: Negative for environmental allergies and polydipsia. Does not bruise/bleed easily.  Psychiatric/Behavioral: Negative for suicidal ideas. The patient is not nervous/anxious.      Past Medical History  Diagnosis Date  . Sickle cell anemia   . Blood transfusion   . 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 Hypokalemia   . Mood disorder   . History of pulmonary embolus (PE)   . Avascular necrosis   . Leukocytosis     Chronic  . Thrombocytosis     Chronic  . Hypertension   . History of Clostridium difficile infection   . Uses marijuana   . Chronic anticoagulation   . Functional asplenia   . Former smoker   . Second hand tobacco smoke exposure   . Alcohol consumption of one to four drinks per day    Past Surgical History  Procedure Laterality Date  . Right hip replacement      08/2006  . Cholecystectomy      01/2008  . Porta cath placement    . Porta cath removal    . Umbilical hernia repair      01/2008  . Excision of left periauricular cyst      10/2009  . Excision of right ear lobe cyst with primary closur      11/2007  . Portacath placement  01/05/2012    Procedure: INSERTION PORT-A-CATH;  Surgeon: Odis Hollingshead, MD;  Location: [place];  Service: General;  Laterality: N/A;  ultrasound guiced port a cath insertion with fluoroscopy   Social History:  reports that he quit smoking about 2 years ago. His smoking use included Cigarettes. He smoked  0.00 packs per day for 13 years. He has never used smokeless tobacco. He reports that he drinks about 6 ounces of alcohol per week. He reports that he uses illicit drugs (Marijuana).  Allergies  Allergen Reactions  . Morphine And Related Hives and Rash    Pt states he can take PO, but not IV and he is able to tolerate Dilaudid with no reactions.    Family History  Problem Relation Age of Onset  . Sickle cell anemia Mother   . Sickle cell anemia Father   . Sickle cell trait Brother      Prior to Admission medications   Medication Sig Start Date End Date Taking? Authorizing Provider  celecoxib (CELEBREX) 200 MG capsule Take 1 capsule (200 mg total) by mouth 2 (two) times daily. 03/30/13  Yes Elwyn Reach, MD  ergocalciferol (VITAMIN D2) 50000 UNITS capsule Take 50,000 Units by mouth once a week. On Tuesday 04/22/13  Yes Dorena Dew, FNP  folic acid (FOLVITE) 1 MG tablet Take 1 tablet (1 mg total) by mouth every morning. 06/05/12  Yes [name], MD  HYDROmorphone (DILAUDID) 4 MG tablet Take 1  tablet (4 mg total) by mouth every 4 (four) hours as needed for severe pain. 05/23/13  Yes Dorena Dew, FNP  hydroxyurea (HYDREA) 500 MG capsule Take 1,000 mg by mouth daily. May take with food to minimize GI side effects. 03/30/13  Yes Elwyn Reach, MD  morphine (MS CONTIN) 15 MG 12 hr tablet Take 1 tablet (15 mg total) by mouth 2 (two) times daily. 05/23/13  Yes Dorena Dew, FNP  potassium chloride SA (K-DUR,KLOR-CON) 20 MEQ tablet Take 1 tablet (20 mEq total) by mouth every morning. 03/30/13  Yes Elwyn Reach, MD  Rivaroxaban (XARELTO) 20 MG TABS tablet Take 20 mg by mouth daily with supper.   Yes Historical Provider, MD  zolpidem (AMBIEN) 10 MG tablet Take 1 tablet (10 mg total) by mouth at bedtime as needed for sleep. 04/24/13  Yes Dorena Dew, FNP   Physical Exam: Filed Vitals:   06/02/13 8588 06/02/13 0818 06/02/13 1007 06/02/13 1157  BP:  124/84  117/78   Pulse: 110 101  93  Temp:  98 F (36.7 C)    TempSrc:      Resp: 22 16  16   SpO2: 97% 96% 88% 96%    Physical Exam  Constitutional: Appears well-developed and well-nourished. No distress.  HENT: Normocephalic. External right and left ear normal.  Eyes: Conjunctivae and EOM are normal. PERRLA, no scleral icterus.  Neck: Normal ROM. Neck supple. No JVD. No tracheal deviation. CVS: tachycardic, S1/S2 +, no murmurs, no gallops, no carotid bruit.  Pulmonary: Effort and breath sounds normal, no stridor, rhonchi, wheezes, rales.  Abdominal: Soft. BS +,  no distension, tenderness, rebound or guarding.  Musculoskeletal: Normal range of motion. No edema and no tenderness.  Lymphadenopathy: No lymphadenopathy noted, cervical, inguinal. Neuro: Alert. Normal reflexes, muscle tone coordination. No cranial nerve deficit. Skin: Skin is warm and dry.  Psychiatric: Normal mood and affect.  Labs on Admission:  Basic Metabolic Panel:  Recent Labs Lab 06/02/13 0620  NA 142  K 3.2*  CL 104  CO2 21  GLUCOSE 181*  BUN 5*  CREATININE 0.54  CALCIUM 9.0   Liver Function Tests:  Recent Labs Lab 06/02/13 0620  AST 47*  ALT 20  ALKPHOS 95  BILITOT 7.0*  PROT 7.9  ALBUMIN 4.2   No results found for this basename: LIPASE, AMYLASE,  in the last 168 hours No results found for this basename: AMMONIA,  in the last 168 hours CBC:  Recent Labs Lab 05/28/13 0936 06/02/13 0620  WBC 20.4* 27.7*  NEUTROABS 16.6* 20.2*  HGB 8.7* 8.5*  HCT 25.1* 23.9*  MCV 93.0 90.2  PLT 626* 515*   Cardiac Enzymes: No results found for this basename: CKTOTAL, CKMB, CKMBINDEX, TROPONINI,  in the last 168 hours BNP: No components found with this basename: POCBNP,  CBG: No results found for this basename: GLUCAP,  in the last 168 hours  If 7PM-7AM, please contact night-coverage [email or web address] Password [email or web address] 06/02/2013, 1:41 PM

## 2013-06-02 NOTE — Progress Notes (Signed)
I was notified by RN that the first troponin was 1.27. I called cardiology consult, PA Suanne Marker said cardio will see the patient. We will cycle cardiac enzymes meanwhile. The 12 lead EKG showed sinus tachycardia. Leisa Lenz [facility] [phone]

## 2013-06-02 NOTE — Progress Notes (Signed)
ANTIBIOTIC CONSULT NOTE - INITIAL  Pharmacy Consult for Vancomycin / Zosyn Indication: HCAP  Allergies  Allergen Reactions  . Morphine And Related Hives and Rash    Pt states he can take PO, but not IV and he is able to tolerate Dilaudid with no reactions.    Patient Measurements:   Adjusted Body Weight:   Vital Signs: Temp: 98 F (36.7 C) (05/03 0818) Temp src: Oral (05/03 0616) BP: 117/78 mmHg (05/03 1157) Pulse Rate: 93 (05/03 1157) Intake/Output from previous day:   Intake/Output from this shift:    Labs:  Recent Labs  06/02/13 0620  WBC 27.7*  HGB 8.5*  PLT 515*  CREATININE 0.54   The CrCl is unknown because both a height and weight (above a minimum accepted value) are required for this calculation. No results found for this basename: VANCOTROUGH, Corlis Leak, VANCORANDOM, [name], GENTPEAK, GENTRANDOM, TOBRATROUGH, TOBRAPEAK, TOBRARND, AMIKACINPEAK, AMIKACINTROU, AMIKACIN,  in the last 72 hours   Microbiology: Recent Results (from the past 720 hour(s))  MRSA PCR SCREENING     Status: Abnormal   Collection Time    05/13/13  6:00 PM      Result Value Ref Range Status   MRSA by PCR POSITIVE (*) NEGATIVE Final   Comment:            The GeneXpert MRSA Assay (FDA     approved for NASAL specimens     only), is one component of a     comprehensive MRSA colonization     surveillance program. It is not     intended to diagnose MRSA     infection nor to guide or     monitor treatment for     MRSA infections.     RESULT CALLED TO, READ BACK BY AND VERIFIED WITH:     C.DENNY,RN AT 2320 ON 05/13/13 BY W.SHEA  CULTURE, BLOOD (ROUTINE X 2)     Status: None   Collection Time    05/13/13  8:35 PM      Result Value Ref Range Status   Specimen Description BLOOD RIGHT ARM   Final   Special Requests BOTTLES DRAWN AEROBIC AND ANAEROBIC 4ML   Final   Culture  Setup Time     Final   Value: 05/14/2013 01:08     Performed at [facility]   Culture     Final    Value: NO GROWTH 5 DAYS     Performed at [facility]   Report Status 05/20/2013 FINAL   Final  CULTURE, BLOOD (ROUTINE X 2)     Status: None   Collection Time    05/13/13  8:40 PM      Result Value Ref Range Status   Specimen Description BLOOD RIGHT HAND   Final   Special Requests BOTTLES DRAWN AEROBIC AND ANAEROBIC 4ML   Final   Culture  Setup Time     Final   Value: 05/14/2013 01:08     Performed at [facility]   Culture     Final   Value: NO GROWTH 5 DAYS     Performed at [facility]   Report Status 05/20/2013 FINAL   Final    Medical History: Past Medical History  Diagnosis Date  . Sickle cell anemia   . Blood transfusion   . 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 Hypokalemia   . Mood disorder   . History of pulmonary embolus (PE)   .  Avascular necrosis   . Leukocytosis     Chronic  . Thrombocytosis     Chronic  . Hypertension   . History of Clostridium difficile infection   . Uses marijuana   . Chronic anticoagulation   . Functional asplenia   . Former smoker   . Second hand tobacco smoke exposure   . Alcohol consumption of one to four drinks per day    Assessment: 42 yoM presents with sickle cell crisis and SOB. PMHx pertinent for recent admission for acute chest syndrome treated with IV abx, discharged on levaquin which patient never filled.  Pharmacy consulted to dose vancomycin and zosyn for HCAP.    Note previous vancomycin trough of 8.8 on Vanc regimen 1g IV q8h with similar creatine levels Last documented weight = 77kg on 05/28/13  5/3 >> vancomycin  >> 5/3 >> zosyn   >>    Tmax: Afebrile WBCs:  27.7 Renal: SCr 0.54  5/3 blood: ordered 5/3 urine: ordered  5/3 sputum: ordered   Goal of Therapy:  Vancomycin trough level 15-20 mcg/ml Eradication of infection  Plan:  Zosyn 3.375g IV q8h (infuse over 4 hours) Vancomycin 2g IV x1, then 1250mg  IV q8h based on previous VT levels, Check VT as  Css  Ralene Bathe, PharmD, BCPS 06/02/2013, 1:52 PM  Pager: [phone]

## 2013-06-03 ENCOUNTER — Telehealth: Payer: Self-pay

## 2013-06-03 ENCOUNTER — Encounter (HOSPITAL_COMMUNITY): Payer: Self-pay

## 2013-06-03 DIAGNOSIS — E876 Hypokalemia: Secondary | ICD-10-CM | POA: Diagnosis not present

## 2013-06-03 DIAGNOSIS — Z86711 Personal history of pulmonary embolism: Secondary | ICD-10-CM | POA: Diagnosis not present

## 2013-06-03 DIAGNOSIS — D5700 Hb-SS disease with crisis, unspecified: Secondary | ICD-10-CM | POA: Diagnosis not present

## 2013-06-03 DIAGNOSIS — I214 Non-ST elevation (NSTEMI) myocardial infarction: Secondary | ICD-10-CM

## 2013-06-03 DIAGNOSIS — I369 Nonrheumatic tricuspid valve disorder, unspecified: Secondary | ICD-10-CM

## 2013-06-03 DIAGNOSIS — R0902 Hypoxemia: Secondary | ICD-10-CM | POA: Diagnosis not present

## 2013-06-03 DIAGNOSIS — J189 Pneumonia, unspecified organism: Secondary | ICD-10-CM | POA: Diagnosis not present

## 2013-06-03 DIAGNOSIS — D5701 Hb-SS disease with acute chest syndrome: Secondary | ICD-10-CM | POA: Diagnosis not present

## 2013-06-03 DIAGNOSIS — D72829 Elevated white blood cell count, unspecified: Secondary | ICD-10-CM

## 2013-06-03 DIAGNOSIS — E559 Vitamin D deficiency, unspecified: Secondary | ICD-10-CM

## 2013-06-03 LAB — COMPREHENSIVE METABOLIC PANEL
ALK PHOS: 70 U/L (ref 39–117)
ALT: 17 U/L (ref 0–53)
AST: 30 U/L (ref 0–37)
Albumin: 3.4 g/dL — ABNORMAL LOW (ref 3.5–5.2)
BUN: 5 mg/dL — AB (ref 6–23)
CALCIUM: 8.3 mg/dL — AB (ref 8.4–10.5)
CO2: 21 mEq/L (ref 19–32)
Chloride: 107 mEq/L (ref 96–112)
Creatinine, Ser: 0.54 mg/dL (ref 0.50–1.35)
GFR calc Af Amer: >90 mL/min (ref >90)
GFR calc non Af Amer: >90 mL/min (ref >90)
GLUCOSE: 105 mg/dL — AB (ref 70–99)
Potassium: 3.4 mEq/L — ABNORMAL LOW (ref 3.7–5.3)
Sodium: 140 mEq/L (ref 137–147)
TOTAL PROTEIN: 6.3 g/dL (ref 6.0–8.3)
Total Bilirubin: 5.4 mg/dL — ABNORMAL HIGH (ref 0.3–1.2)

## 2013-06-03 LAB — CBC WITH DIFFERENTIAL/PLATELET
Basophils Absolute: 0.1 10*3/uL (ref 0.0–0.1)
Basophils Relative: 1 % (ref 0–1)
EOS ABS: 0.8 10*3/uL — AB (ref 0.0–0.7)
Eosinophils Relative: 4 % (ref 0–5)
HEMATOCRIT: 21.6 % — AB (ref 39.0–52.0)
HEMOGLOBIN: 7.7 g/dL — AB (ref 13.0–17.0)
Lymphocytes Relative: 13 % (ref 12–46)
Lymphs Abs: 2.6 10*3/uL (ref 0.7–4.0)
MCH: 32.8 pg (ref 26.0–34.0)
MCHC: 35.6 g/dL (ref 30.0–36.0)
MCV: 91.9 fL (ref 78.0–100.0)
MONOS PCT: 12 % (ref 3–12)
Monocytes Absolute: 2.3 10*3/uL — ABNORMAL HIGH (ref 0.1–1.0)
Neutro Abs: 14.1 10*3/uL — ABNORMAL HIGH (ref 1.7–7.7)
Neutrophils Relative %: 71 % (ref 43–77)
Platelets: 428 10*3/uL — ABNORMAL HIGH (ref 150–400)
RBC: 2.35 MIL/uL — AB (ref 4.22–5.81)
RDW: 21 % — ABNORMAL HIGH (ref 11.5–15.5)
WBC: 19.8 10*3/uL — ABNORMAL HIGH (ref 4.0–10.5)

## 2013-06-03 LAB — TROPONIN I: Troponin I: 1.25 ng/mL (ref <0.30)

## 2013-06-03 MED ORDER — POTASSIUM CHLORIDE CRYS ER 20 MEQ PO TBCR
20.0000 meq | EXTENDED_RELEASE_TABLET | Freq: Three times a day (TID) | ORAL | Status: DC
  Administered 2013-06-03 – 2013-06-04 (×5): 20 meq via ORAL
  Filled 2013-06-03 (×8): qty 1

## 2013-06-03 MED ORDER — IPRATROPIUM-ALBUTEROL 0.5-2.5 (3) MG/3ML IN SOLN
3.0000 mL | Freq: Four times a day (QID) | RESPIRATORY_TRACT | Status: DC
  Administered 2013-06-03 – 2013-06-04 (×5): 3 mL via RESPIRATORY_TRACT
  Filled 2013-06-03 (×5): qty 3

## 2013-06-03 MED ORDER — KCL IN DEXTROSE-NACL 20-5-0.45 MEQ/L-%-% IV SOLN
INTRAVENOUS | Status: DC
  Administered 2013-06-03 – 2013-06-05 (×3): via INTRAVENOUS
  Filled 2013-06-03 (×4): qty 1000

## 2013-06-03 MED ORDER — ALBUTEROL SULFATE (2.5 MG/3ML) 0.083% IN NEBU: 2.5000 mg | INHALATION_SOLUTION | RESPIRATORY_TRACT | Status: DC | PRN

## 2013-06-03 NOTE — Progress Notes (Signed)
  Echocardiogram 2D Echocardiogram has been performed.  [name] 06/03/2013, 10:02 AM

## 2013-06-03 NOTE — Discharge Summary (Signed)
Pt examined and discussed with NP Cammie Sickle. Agree with discharge home. Also noted that ECHO performed on last hospitalization showed evidence of Pulmonary HTN and severe tricuspid regurgitation. Pt without any SOB or DOE at present. He also has no evidence of peripheral edema or elevation of JVP. I have discussed with patient and NP Smith Robert a referral to Cardiology for consideration of RHC and to pulmonology for treatment options once Pulmonary HTN confirmed.

## 2013-06-03 NOTE — Progress Notes (Signed)
PHYSICIAN PROGRESS NOTE  DAYTONA TRAM [id number] DOB: 1980/06/17 DOA: 06/02/2013  06/03/2013   PCP: [name],[name], MD  Admission History: 33 year old male with past medical history of sickle cell disease and related anemia, pulmonary embolism on anticoagulation with xarelto, frequent admissions for sickle cell pain crises who presented to [facility] ED 06/02/2013 with ongoing intractable generalized pain and associated shortness of breath that started this am. Pt reported his home medications this not relieve the pain. Pain is generalized and is 10/10 in intensity. Pain is worse with moving around however it is also presented at rest. He had some chest pain as well in mid chest, 7/10 in intensity, non radiating and also associated with shortness of breath but no cough. No reports of fevers at home. No abdominal pain, nausea or vomiting.  In ED, BP was 141/105, HR 109-120, T max 98 F and oxygen saturation 87% on room air. His blood work was significant for WBC count of 27.7, hemoglobin 8.5 and potassium of 3.2. CT angio chest showed no evidence of pulmonary embolism but there is an interval development of airspace opacities within the right lower and to a lesser extent, the right middle lobes, worrisome for developing areas of infection and/or acute chest syndrome. CXR showed low lung volumes with basilar scarring. Pt was started on vanco and zosyn and admitted to SDU.   Assessment/Plan: 1. Acute SS Crisis with associated ACS - Pt is reporting now that he is feeling much better.  He is on a dilaudid PCA.  He has used 17.3 mg, 61 demands, 56 deliveries in 24 hours.  His CT angio is concerning for ACS versus pneumonia.  Pt does not have any clinical symptoms for pneumonia.  If this is ACS, then this is likely a mild ACS.  Will check AM hemoglobin and transfuse for Hg less than 7.  Continuing broad spectrum antibiotics for now.  Transfer to telemetry bed.  2. HCAP - Pt has been empirically started on  Zosyn/Vanc pending further evaluation.   3. Leukocytosis - likely related to vaso-occlusive crisis versus possible pneumonia infection, following CBC 4. Elevated Troponin - appreciate cardiology assistance. Pt's troponin is trending down now, Pt says he is feeling better. No chest pain.  He had some chest tightness earlier but no recurrence.  Pt recently had ECHO performed this morning.  Cardiology is reporting this likely type 2 NSTEMI in setting of sickle cell crisis.  5. History of PE on chronic anticoagulation - continue Xarelto daily 6. Hypokalemia - will replace orally.  Check Mg.   7. 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   Code Status: Full  Family Communication: counseled patient at bedside  Disposition Plan: transfer to telemetry bed  HPI/Subjective: Pt says that he is feeling much better today. He denies shortness of breath.  He had some mild chest tightness last evening but has had no recurrence.  He is having pain in back and legs but has improved significantly since admission.  He denies fever and chills.  He denies cough.  He says that he has no shooting pain in back.  He does have pain in rib cage bilateral which he has typically had in the past with a sickle cell crisis.    Objective: Filed Vitals:   06/03/13 0747  BP:   Pulse:   Temp:   Resp: 20   Filed Vitals:   06/03/13 0200 06/03/13 0400 06/03/13 0745 06/03/13 0747  BP: 137/94  Pulse: 97     Temp:  98.4 F (36.9 C)    TempSrc:  Oral    Resp: 25 25  20   Height:      Weight:  171 lb 4.8 oz (77.7 kg)    SpO2: 94% 94% 94% 94%    Intake/Output Summary (Last 24 hours) at 06/03/13 1017 Last data filed at 06/03/13 1607  Gross per 24 hour  Intake   2050 ml  Output   1500 ml  Net    550 ml   Filed Weights   06/02/13 1354 06/02/13 1525 06/03/13 0400  Weight: 169 lb (76.658 kg) 168 lb 6.9 oz (76.4 kg) 171 lb 4.8 oz (77.7 kg)    Review Of Systems: As  detailed above, otherwise all reviewed and negative  Exam:  Constitutional: Appears well-developed and well-nourished. No distress.  HENT: Normocephalic. External right and left ear normal.  Eyes: Conjunctivae and EOM are normal. PERRLA, mild bilateral scleral icterus.  Neck: Normal ROM. Neck supple. No JVD. No tracheal deviation.  CVS: tachycardic, S1/S2 +, no murmurs, no gallops, no carotid bruit.  Pulmonary: Effort and breath sounds normal, no stridor, rhonchi, wheezes, rales.  Abdominal: Soft. BS +, no distension, tenderness, rebound or guarding.  Musculoskeletal: Normal range of motion. No edema and no tenderness.  Lymphadenopathy: No lymphadenopathy noted, cervical, inguinal. Neuro: Alert. Normal reflexes, muscle tone coordination. No cranial nerve deficit.  Skin: Skin is warm and dry.  Psychiatric: Normal mood and affect.  Data Reviewed: Basic Metabolic Panel:  Recent Labs Lab 06/02/13 0620 06/02/13 1525  NA 142 141  K 3.2* 3.2*  CL 104 107  CO2 21 21  GLUCOSE 181* 97  BUN 5* 6  CREATININE 0.54 0.60  CALCIUM 9.0 8.3*  MG  --  1.7   Liver Function Tests:  Recent Labs Lab 06/02/13 0620 06/02/13 1525  AST 47* 33  ALT 20 17  ALKPHOS 95 80  BILITOT 7.0* 5.3*  PROT 7.9 6.6  ALBUMIN 4.2 3.6   No results found for this basename: LIPASE, AMYLASE,  in the last 168 hours No results found for this basename: AMMONIA,  in the last 168 hours CBC:  Recent Labs Lab 05/28/13 0936 06/02/13 0620 06/02/13 1525  WBC 20.4* 27.7* 24.1*  NEUTROABS 16.6* 20.2* 17.4*  HGB 8.7* 8.5* 7.7*  HCT 25.1* 23.9* 22.2*  MCV 93.0 90.2 92.9  PLT 626* 515* 451*   Cardiac Enzymes:  Recent Labs Lab 06/02/13 1525 06/02/13 2120 06/03/13 0610  TROPONINI 1.27* 1.41* 1.25*   BNP (last 3 results)  Recent Labs  11/13/12 1826  PROBNP 106.3   CBG: No results found for this basename: GLUCAP,  in the last 168 hours  Recent Results (from the past 240 hour(s))  MRSA PCR SCREENING      Status: None   Collection Time    06/02/13  3:33 PM      Result Value Ref Range Status   MRSA by PCR NEGATIVE  NEGATIVE Final   Comment:            The GeneXpert MRSA Assay (FDA     approved for NASAL specimens     only), is one component of a     comprehensive MRSA colonization     surveillance program. It is not     intended to diagnose MRSA     infection nor to guide or     monitor treatment for     MRSA infections.    Studies: Dg  Chest 2 View  06/02/2013   CLINICAL DATA:  Sickle cell crisis.  Shortness of breath.  EXAM: CHEST  2 VIEW  COMPARISON:  05/14/2013  FINDINGS: Left chest wall port a catheter is noted with tip in the cavoatrial junction. The heart size is enlarged. The lung volumes are low and there is scarring within both lung bases. No pleural effusions or edema. No airspace consolidation.  IMPRESSION: 1. Low lung volumes with bibasilar scarring.  . 2. Cardiac enlargement.   Electronically Signed   By: Kerby Moors M.D.   On: 06/02/2013 09:36   Ct Angio Chest Pe W/cm &/or Wo Cm  06/02/2013   CLINICAL DATA:  Shortness of breath, chest pain, hypoxia, tachycardia, history of sickle cell anemia, evaluate for pulmonary embolism  EXAM: CT ANGIOGRAPHY CHEST WITH CONTRAST  TECHNIQUE: Multidetector CT imaging of the chest was performed using the standard protocol during bolus administration of intravenous contrast. Multiplanar CT image reconstructions and MIPs were obtained to evaluate the vascular anatomy.  CONTRAST:  126mL OMNIPAQUE IOHEXOL 350 MG/ML SOLN  COMPARISON:  CT ANGIO CHEST W/CM &/OR WO/CM dated 05/18/2013; CT ANGIO CHEST W/CM &/OR WO/CM dated 03/26/2013; CT ANGIO CHEST W/CM &/OR WO/CM dated 11/13/2012; CT ANGIO CHEST W/CM &/OR WO/CM dated 08/27/2012  FINDINGS: Vascular Findings:  There is suboptimal adequate opacification of the pulmonary arterial system with the main pulmonary artery measuring only 187 Hounsfield units. Given this limitation, there are no discrete filling  defects within the pulmonary arterial tree to the level of the bilateral subsegmental pulmonary arteries. Evaluation of distal subsegmental pulmonary arteries is degraded secondary to suboptimal vessel opacification and patient respiratory artifact. There is grossly unchanged marked enlargement of the main pulmonary artery measuring approximately 38 mm in diameter.  Cardiomegaly. No pericardial effusion. Normal caliber of the thoracic aorta. A Bentson configuration of the aortic arch. Branch vessels of the aortic arch widely patent throughout their imaged course. No definite thoracic aortic dissection or periaortic stranding.  There is a left anterior chest wall subclavian vein approach port a catheter with tip terminating within the superior aspect of the right atrium.  Review of the MIP images confirms the above findings.   ----------------------------------------------------------------------------------  Nonvascular Findings:  Evaluation of the pulmonary parenchyma is degraded secondary to patient respiratory artifact.  Interval development / worsening of a ill-defined potentially partially cavitary approximately 2.0 x 1.4 cm opacity within the peripheral aspect of the superior segment of the right lower lobe (image 67, series 9). There is an additional ill-defined area of ground-glass within the subpleural aspect of the right lower lobe (image 65, series 9).  There is grossly symmetric subpleural dependent subsegmental atelectasis within the bilateral lower lobes and caudal segment of the lingula. No pleural effusion or pneumothorax. The central pulmonary airways appear widely patent. Shotty prevascular lymph nodes are individually not enlarged by size criteria with index prevascular lymph node measuring approximately 0.9 cm in greatest short axis diameter (image 46, series 6). No mediastinal, hilar or axillary lymphadenopathy.  Early arterial phase evaluation of the upper abdomen is normal.  No definite acute  or aggressive osseous abnormalities. Accentuated kyphosis within in the superior aspect of the thoracic spine, likely positional. Stigmata of sickle cell disease affecting the imaged osseous structures of the chest including mild diffuse increased slightly mottled sclerosis and fish-mouth deformities about multiple intervertebral disc spaces within in the mid cranial and mid aspects of the thoracic spine.  Thyroid gland appears normal given obliquity. Regional soft tissues appear normal.  IMPRESSION:  1. No evidence of pulmonary embolism to the level of the bilateral subsegmental pulmonary arteries. 2. Interval development of airspace opacities within the right lower and to a lesser extent, the right middle lobes, worrisome for developing areas of infection and/or acute chest syndrome in this patient with known sickle cell disease. 3. Cardiomegaly with enlargement of the caliber of the main pulmonary artery, nonspecific though could be seen in the setting of pulmonary arterial hypertension. Further evaluation could be performed with cardiac echo as clinically indicated.   Electronically Signed   By: Sandi Mariscal M.D.   On: 06/02/2013 12:02   Scheduled Meds: . folic acid  1 mg Oral q morning - 10a  . HYDROmorphone PCA 0.3 mg/mL   Intravenous 6 times per day  . hydroxyurea  1,000 mg Oral Daily  . ipratropium-albuterol  3 mL Nebulization QID  . ketorolac  30 mg Intravenous 4 times per day  . morphine  15 mg Oral BID  . piperacillin-tazobactam (ZOSYN)  IV  3.375 g Intravenous 3 times per day  . potassium chloride SA  20 mEq Oral q morning - 10a  . rivaroxaban  20 mg Oral Q supper  . senna  1 tablet Oral q12n4p  . vancomycin  1,250 mg Intravenous Q8H   Continuous Infusions: . sodium chloride 1,000 mL (06/02/13 1455)   Principal Problem:   Acute respiratory failure with hypoxia Active Problems:   Hx of pulmonary embolus   Leukocytosis, unspecified   Hypokalemia   Sickle-cell crisis with associated  acute chest syndrome   HCAP (healthcare-associated pneumonia)  [facility] Pager [phone].  If 7PM-7AM, please contact night-coverage at [email or web address], password [facility] 06/03/2013, 10:17 AM  LOS: 1 day

## 2013-06-03 NOTE — Telephone Encounter (Signed)
Pt was contacted and told of his [facility]  Appointment to [facility]. [place]. [place] 07/04/1013@9  A.M. Pt did verbalize understanding of this.Paperwork was  faxed over today per request of NP C. Hollis.

## 2013-06-03 NOTE — ED Provider Notes (Signed)
Medical screening examination/treatment/procedure(s) were performed by non-physician practitioner and as supervising physician I was immediately available for consultation/collaboration.   EKG Interpretation   Date/Time:  Sunday Jun 02 2013 06:13:53 EDT Ventricular Rate:  110 PR Interval:  172 QRS Duration: 108 QT Interval:  387 QTC Calculation: 524 R Axis:   -69 Text Interpretation:  Sinus tachycardia Left anterior fascicular block  Nonspecific ST and T wave abnormality Prolonged QT interval Confirmed by  Marnette Burgess  MD, BRIAN ([id number]) on 06/02/2013 6:22:45 AM        Orpah Greek, MD 06/03/13 [phone]

## 2013-06-03 NOTE — Progress Notes (Signed)
CARE MANAGEMENT NOTE 06/03/2013  Patient:  [name], [name]   Account Number:  [id number]  Date Initiated:  06/03/2013  Documentation initiated by:  DAVIS,RHONDA  Subjective/Objective Assessment:   hx of scc, now with chest pain and positive cardiac enz's, wbc 27.5 hypoxic, pain not controlled     Action/Plan:   home when stable   Anticipated DC Date:  06/06/2013   Anticipated DC Plan:  HOME/SELF CARE  In-house referral  NA      DC Planning Services  NA      PAC Choice  NA   Choice offered to / List presented to:  NA   DME arranged  NA      DME agency  NA     [place] arranged  NA      [place] agency  NA   Status of service:  In process, will continue to follow Medicare Important Message given?  YES (If response is "NO", the following Medicare IM given date fields will be blank) Date Medicare IM given:  06/04/2013 Date Additional Medicare IM given:    Discharge Disposition:    Per UR Regulation:  Reviewed for med. necessity/level of care/duration of stay  If discussed at [place] of Stay Meetings, dates discussed:    Comments:  [id number]/Rhonda Eldridge Dace, BSN, [place] [phone] Chart Reviewed for discharge and hospital needs. Discharge needs at time of review: None present will follow for needs. Review of patient progress due on [id number]. [id number]/Important Medicare message reviewed with patient or representative and signed, copy to patient and one to the chart./Rhonda Rosana Hoes, RN,BSN,CCM.  [phone]

## 2013-06-03 NOTE — Progress Notes (Signed)
    Subjective:  Feels better this AM. Had a little chest tightness left of sternum last night, brief. Resolved. No SOB.   Objective:  Vital Signs in the last 24 hours: Temp:  [97.8 F (36.6 C)-99.2 F (37.3 C)] 98.4 F (36.9 C) (05/04 0400) Pulse Rate:  [80-98] 97 (05/04 0200) Resp:  [16-26] 20 (05/04 0747) BP: (117-145)/(76-114) 137/94 mmHg (05/04 0200) SpO2:  [88 %-99 %] 94 % (05/04 0747) Weight:  [168 lb 6.9 oz (76.4 kg)-171 lb 4.8 oz (77.7 kg)] 171 lb 4.8 oz (77.7 kg) (05/04 0400)  Intake/Output from previous day: 05/03 0701 - 05/04 0700 In: 2050 [I.V.:1500; IV Piggyback:550] Out: 1500 [Urine:1500]   Physical Exam: General: Well developed, well nourished, in no acute distress. Head:  Normocephalic and atraumatic. Lungs: Clear to auscultation and percussion. Heart: Normal S1 and S2, +P2 accentuation.  No murmur, rubs or gallops.  Abdomen: soft, non-tender, positive bowel sounds. Extremities: No clubbing or cyanosis. No edema. Neurologic: Alert and oriented x 3.    Lab Results:  Recent Labs  06/02/13 0620 06/02/13 1525  WBC 27.7* 24.1*  HGB 8.5* 7.7*  PLT 515* 451*    Recent Labs  06/02/13 0620 06/02/13 1525  NA 142 141  K 3.2* 3.2*  CL 104 107  CO2 21 21  GLUCOSE 181* 97  BUN 5* 6  CREATININE 0.54 0.60    Recent Labs  06/02/13 2120 06/03/13 0610  TROPONINI 1.41* 1.25*   Hepatic Function Panel  Recent Labs  06/02/13 1525  PROT 6.6  ALBUMIN 3.6  AST 33  ALT 17  ALKPHOS 80  BILITOT 5.3*    Imaging: CT chest  - no PE  - pulm HTN   Telemetry: No adverse rhythm Personally viewed.   EKG:  TWI noted precordial leads  Cardiac Studies:  4/18: EF 65%, pulm HTN (33mmHg) . folic acid  1 mg Oral q morning - 10a  . HYDROmorphone PCA 0.3 mg/mL   Intravenous 6 times per day  . hydroxyurea  1,000 mg Oral Daily  . ipratropium-albuterol  3 mL Nebulization QID  . ketorolac  30 mg Intravenous 4 times per day  . morphine  15 mg Oral BID  .  piperacillin-tazobactam (ZOSYN)  IV  3.375 g Intravenous 3 times per day  . potassium chloride SA  20 mEq Oral q morning - 10a  . rivaroxaban  20 mg Oral Q supper  . senna  1 tablet Oral q12n4p  . vancomycin  1,250 mg Intravenous Q8H   Assessment/Plan:  Principal Problem:   Acute respiratory failure with hypoxia Active Problems:   Hx of pulmonary embolus   Leukocytosis, unspecified   Hypokalemia   Sickle-cell crisis with associated acute chest syndrome   HCAP (healthcare-associated pneumonia)   - Troponin 1.25 (no significant increase)  - Likely Type 2 NSTEMI in the setting of sickle cell crisis.   - ECG with TWI precordial leads (sometimes seen in LAD CAD).   - new ECHO pending.  - Continue Xarelto  - consider transfusion if Hg <7.   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   Further recs pending ECHO.    [name] 06/03/2013, 8:41 AM

## 2013-06-04 ENCOUNTER — Telehealth: Payer: Self-pay

## 2013-06-04 ENCOUNTER — Encounter: Payer: Self-pay | Admitting: Family Medicine

## 2013-06-04 DIAGNOSIS — I2789 Other specified pulmonary heart diseases: Secondary | ICD-10-CM

## 2013-06-04 DIAGNOSIS — I214 Non-ST elevation (NSTEMI) myocardial infarction: Secondary | ICD-10-CM | POA: Diagnosis not present

## 2013-06-04 DIAGNOSIS — D5701 Hb-SS disease with acute chest syndrome: Secondary | ICD-10-CM | POA: Diagnosis not present

## 2013-06-04 DIAGNOSIS — R0902 Hypoxemia: Secondary | ICD-10-CM | POA: Diagnosis not present

## 2013-06-04 DIAGNOSIS — J189 Pneumonia, unspecified organism: Secondary | ICD-10-CM | POA: Diagnosis not present

## 2013-06-04 DIAGNOSIS — D5700 Hb-SS disease with crisis, unspecified: Secondary | ICD-10-CM | POA: Diagnosis not present

## 2013-06-04 LAB — CBC WITH DIFFERENTIAL/PLATELET
BASOS ABS: 0.2 10*3/uL — AB (ref 0.0–0.1)
Basophils Relative: 1 % (ref 0–1)
Eosinophils Absolute: 1.2 10*3/uL — ABNORMAL HIGH (ref 0.0–0.7)
Eosinophils Relative: 6 % — ABNORMAL HIGH (ref 0–5)
HCT: 20.7 % — ABNORMAL LOW (ref 39.0–52.0)
HEMOGLOBIN: 7.2 g/dL — AB (ref 13.0–17.0)
LYMPHS PCT: 13 % (ref 12–46)
Lymphs Abs: 2.5 10*3/uL (ref 0.7–4.0)
MCH: 32.4 pg (ref 26.0–34.0)
MCHC: 34.8 g/dL (ref 30.0–36.0)
MCV: 93.2 fL (ref 78.0–100.0)
Monocytes Absolute: 2.1 10*3/uL — ABNORMAL HIGH (ref 0.1–1.0)
Monocytes Relative: 11 % (ref 3–12)
NEUTROS ABS: 13.2 10*3/uL — AB (ref 1.7–7.7)
Neutrophils Relative %: 69 % (ref 43–77)
Platelets: 443 10*3/uL — ABNORMAL HIGH (ref 150–400)
RBC: 2.22 MIL/uL — ABNORMAL LOW (ref 4.22–5.81)
RDW: 21.5 % — AB (ref 11.5–15.5)
WBC: 19.2 10*3/uL — AB (ref 4.0–10.5)

## 2013-06-04 LAB — MAGNESIUM: Magnesium: 1.7 mg/dL (ref 1.5–2.5)

## 2013-06-04 LAB — LIPID PANEL
CHOL/HDL RATIO: 2.6 ratio
Cholesterol: 63 mg/dL (ref 0–200)
HDL: 24 mg/dL — AB (ref >39)
LDL CALC: 26 mg/dL (ref 0–99)
Triglycerides: 63 mg/dL (ref <150)
VLDL: 13 mg/dL (ref 0–40)

## 2013-06-04 LAB — COMPREHENSIVE METABOLIC PANEL
ALBUMIN: 3.4 g/dL — AB (ref 3.5–5.2)
ALK PHOS: 71 U/L (ref 39–117)
ALT: 14 U/L (ref 0–53)
AST: 23 U/L (ref 0–37)
BILIRUBIN TOTAL: 4.9 mg/dL — AB (ref 0.3–1.2)
BUN: 5 mg/dL — ABNORMAL LOW (ref 6–23)
CO2: 22 mEq/L (ref 19–32)
Calcium: 8.4 mg/dL (ref 8.4–10.5)
Chloride: 108 mEq/L (ref 96–112)
Creatinine, Ser: 0.54 mg/dL (ref 0.50–1.35)
GFR calc Af Amer: >90 mL/min (ref >90)
GFR calc non Af Amer: >90 mL/min (ref >90)
Glucose, Bld: 88 mg/dL (ref 70–99)
POTASSIUM: 3.9 meq/L (ref 3.7–5.3)
SODIUM: 142 meq/L (ref 137–147)
TOTAL PROTEIN: 6.5 g/dL (ref 6.0–8.3)

## 2013-06-04 MED ORDER — IPRATROPIUM-ALBUTEROL 0.5-2.5 (3) MG/3ML IN SOLN
3.0000 mL | Freq: Two times a day (BID) | RESPIRATORY_TRACT | Status: DC
  Administered 2013-06-04: 3 mL via RESPIRATORY_TRACT
  Filled 2013-06-04 (×2): qty 3

## 2013-06-04 MED ORDER — ZOLPIDEM TARTRATE 5 MG PO TABS
5.0000 mg | ORAL_TABLET | Freq: Every evening | ORAL | Status: DC | PRN
  Administered 2013-06-04: 5 mg via ORAL
  Filled 2013-06-04: qty 1

## 2013-06-04 MED ORDER — KETOROLAC TROMETHAMINE 15 MG/ML IJ SOLN
30.0000 mg | Freq: Four times a day (QID) | INTRAMUSCULAR | Status: DC
  Administered 2013-06-04 – 2013-06-05 (×4): 30 mg via INTRAVENOUS
  Filled 2013-06-04 (×6): qty 2

## 2013-06-04 NOTE — Progress Notes (Signed)
    Subjective:  Feels better this AM.No chest pain. Sleeping but arousable.   Objective:  Vital Signs in the last 24 hours: Temp:  [97.8 F (36.6 C)-98.6 F (37 C)] 98.6 F (37 C) (05/05 0647) Pulse Rate:  [65-81] 78 (05/05 0647) Resp:  [16-26] 19 (05/05 0647) BP: (130-148)/(20-99) 130/76 mmHg (05/05 0647) SpO2:  [91 %-100 %] 93 % (05/05 0647)  Intake/Output from previous day: 05/04 0701 - 05/05 0700 In: 2530.8 [P.O.:480; I.V.:1200.8; IV [id number]:921] Out: 2775 [Urine:2775]   Physical Exam: General: Well developed, well nourished, in no acute distress. Head:  Normocephalic and atraumatic. Lungs: Clear to auscultation and percussion. Heart: Normal S1 and S2, +P2 accentuation.  No murmur, rubs or gallops.  Abdomen: soft, non-tender, positive bowel sounds. Extremities: No clubbing or cyanosis. No edema. Neurologic: Alert and oriented x 3.    Lab Results:  Recent Labs  06/03/13 1030 06/04/13 0524  WBC 19.8* 19.2*  HGB 7.7* 7.2*  PLT 428* 443*    Recent Labs  06/03/13 1030 06/04/13 0524  NA 140 142  K 3.4* 3.9  CL 107 108  CO2 21 22  GLUCOSE 105* 88  BUN 5* 5*  CREATININE 0.54 0.54    Recent Labs  06/02/13 2120 06/03/13 0610  TROPONINI 1.41* 1.25*   Hepatic Function Panel  Recent Labs  06/04/13 0524  PROT 6.5  ALBUMIN 3.4*  AST 23  ALT 14  ALKPHOS 71  BILITOT 4.9*    Imaging: CT chest  - no PE  - pulm HTN   Telemetry: No adverse rhythm Personally viewed.   EKG:  TWI noted precordial leads  Cardiac Studies:   4/18: EF 65%, pulm HTN (53mmHg) 06/03/13: Normal EF, no effusion. Moderate TR, pulm HTN (73mmHg)  . folic acid  1 mg Oral q morning - 10a  . HYDROmorphone PCA 0.3 mg/mL   Intravenous 6 times per day  . hydroxyurea  1,000 mg Oral Daily  . ipratropium-albuterol  3 mL Nebulization QID  . ketorolac  30 mg Intravenous 4 times per day  . morphine  15 mg Oral BID  . piperacillin-tazobactam (ZOSYN)  IV  3.375 g Intravenous 3 times  per day  . potassium chloride SA  20 mEq Oral TID  . rivaroxaban  20 mg Oral Q supper  . senna  1 tablet Oral q12n4p  . 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   - ECG with TWI precordial leads (sometimes seen in LAD CAD).   - ECHO with no wall motion abnormality, normal EF, no effusion. Reassuring.   - Continue Xarelto  - consider transfusion if Hg <7.   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 Continue to treat underlying condition.   - Patient has cardiology Appointment to [facility] [place] 07/04/1013@9  A.M. Pt did verbalize understanding of this.   No further cardiac workup at this point. Will sign off. Please call if questions.    [name] 06/04/2013, 7:02 AM

## 2013-06-04 NOTE — Telephone Encounter (Signed)
Pt 's Appointment has been set up for May 26,2015@3 :00P.M.@[facility] [address]. [place] Attending.Pt is in Hospital at this time.Pt will be notified of appointment when he is discharged.

## 2013-06-04 NOTE — Progress Notes (Signed)
Patient ID: [name], male   DOB: 1981-01-27, 33 y.o.   MRN: [id number] Addendum to follow up appointment on [id number]. 2D Echo reviewed. Patient was noted to have severe tricuspid regurgitation without complaints of SOB. Patient has a history of multiple episodes of acute chest and pulmonary hypertension. Will refer to cardiology for outpatient follow-up. Due to history of smoking and and alcohol use will send to outpatient pulmonology. Appointments scheduled.

## 2013-06-04 NOTE — Progress Notes (Signed)
Subjective: 33 year old gentleman admitted with sickle cell painful crisis as well as acute chest syndrome due to healthcare associated pneumonia. Patient has been on Dilaudid PCA and he seemed to have been doing better. He was having hypoxia when he came in but seems to be getting better now. He denied any cough or shortness of breath. No fever or chills. His pain level is described as 6-7/10 today. He has been able to walk to the bathroom and down the hallway. He has used 20.94 mg of Dilaudid in the last 24 hours with 79 demands and 70 deliveries.  Objective: Vital signs in last 24 hours: Temp:  [98 F (36.7 C)-98.6 F (37 C)] 98 F (36.7 C) (05/05 1446) Pulse Rate:  [72-80] 72 (05/05 1446) Resp:  [19-20] 20 (05/05 1446) BP: (130-138)/(76-96) 134/96 mmHg (05/05 1446) SpO2:  [91 %-99 %] 97 % (05/05 1446) Weight change:  Last BM Date: 06/03/13  Intake/Output from previous day: 05/04 0701 - 05/05 0700 In: 2530.8 [P.O.:480; I.V.:1200.8; IV Piggyback:850] Out: 3400 [Urine:3400] Intake/Output this shift: Total I/O In: 420 [P.O.:420] Out: 975 [Urine:975]  General appearance: alert, cooperative and no distress Eyes: conjunctivae/corneas clear. PERRL, EOM's intact. Fundi benign. Neck: no adenopathy, no carotid bruit, no JVD, supple, symmetrical, trachea midline and thyroid not enlarged, symmetric, no tenderness/mass/nodules Back: symmetric, no curvature. ROM normal. No CVA tenderness. Resp: rales bilaterally Chest wall: no tenderness Cardio: regular rate and rhythm, S1, S2 normal, no murmur, click, rub or gallop GI: soft, non-tender; bowel sounds normal; no masses,  no organomegaly Extremities: extremities normal, atraumatic, no cyanosis or edema Pulses: 2+ and symmetric Skin: Skin color, texture, turgor normal. No rashes or lesions Neurologic: Grossly normal  Lab Results:  Recent Labs  06/03/13 1030 06/04/13 0524  WBC 19.8* 19.2*  HGB 7.7* 7.2*  HCT 21.6* 20.7*  PLT 428* 443*    BMET  Recent Labs  06/03/13 1030 06/04/13 0524  NA 140 142  K 3.4* 3.9  CL 107 108  CO2 21 22  GLUCOSE 105* 88  BUN 5* 5*  CREATININE 0.54 0.54  CALCIUM 8.3* 8.4    Studies/Results: No results found.  Medications: I have reviewed the patient's current medications.  Assessment/Plan: A 33 year old gentleman admitted with sickle cell painful crisis and acute chest syndrome.  #1 acute chest syndrome: Seems to have resolved now. This is secondary to pneumonia. We will continue his antibiotics.  #2 Healthcare associated pneumonia: Again patient is on Zosyn and vancomycin.. We will transition him to oral quinolones at discharge. Cultures are still pending.  #3 leukocytosis: This seems to be getting better. Most likely secondary to vaso-occlusive disorder  #4 elevated troponins: These have resolved mostly. Cardiology has cleared patient.   #5 history of pulmonary embolism: Patient is on chronic anticoagulation we will continue.  #6 hypokalemia: The symptoms resolved.  #7 sickle cell anemia: Patient's H&H seems stable now.  LOS: 2 days   Elwyn Reach 06/04/2013, 3:22 PM

## 2013-06-05 DIAGNOSIS — D5700 Hb-SS disease with crisis, unspecified: Secondary | ICD-10-CM | POA: Diagnosis not present

## 2013-06-05 DIAGNOSIS — R0902 Hypoxemia: Secondary | ICD-10-CM | POA: Diagnosis not present

## 2013-06-05 DIAGNOSIS — J189 Pneumonia, unspecified organism: Secondary | ICD-10-CM | POA: Diagnosis not present

## 2013-06-05 LAB — COMPREHENSIVE METABOLIC PANEL
ALK PHOS: 78 U/L (ref 39–117)
ALT: 14 U/L (ref 0–53)
AST: 25 U/L (ref 0–37)
Albumin: 3.6 g/dL (ref 3.5–5.2)
BILIRUBIN TOTAL: 3.4 mg/dL — AB (ref 0.3–1.2)
BUN: 4 mg/dL — AB (ref 6–23)
CHLORIDE: 104 meq/L (ref 96–112)
CO2: 21 mEq/L (ref 19–32)
CREATININE: 0.58 mg/dL (ref 0.50–1.35)
Calcium: 8.6 mg/dL (ref 8.4–10.5)
GFR calc Af Amer: >90 mL/min (ref >90)
GFR calc non Af Amer: >90 mL/min (ref >90)
Glucose, Bld: 120 mg/dL — ABNORMAL HIGH (ref 70–99)
POTASSIUM: 4.7 meq/L (ref 3.7–5.3)
Sodium: 137 mEq/L (ref 137–147)
TOTAL PROTEIN: 6.7 g/dL (ref 6.0–8.3)

## 2013-06-05 LAB — CBC WITH DIFFERENTIAL/PLATELET
BASOS ABS: 0 10*3/uL (ref 0.0–0.1)
Basophils Relative: 0 % (ref 0–1)
EOS PCT: 6 % — AB (ref 0–5)
Eosinophils Absolute: 1.2 10*3/uL — ABNORMAL HIGH (ref 0.0–0.7)
HCT: 21.7 % — ABNORMAL LOW (ref 39.0–52.0)
Hemoglobin: 7.6 g/dL — ABNORMAL LOW (ref 13.0–17.0)
LYMPHS ABS: 2.5 10*3/uL (ref 0.7–4.0)
Lymphocytes Relative: 13 % (ref 12–46)
MCH: 32.6 pg (ref 26.0–34.0)
MCHC: 35 g/dL (ref 30.0–36.0)
MCV: 93.1 fL (ref 78.0–100.0)
MONOS PCT: 11 % (ref 3–12)
Monocytes Absolute: 2.1 10*3/uL — ABNORMAL HIGH (ref 0.1–1.0)
NEUTROS PCT: 70 % (ref 43–77)
Neutro Abs: 13.4 10*3/uL — ABNORMAL HIGH (ref 1.7–7.7)
PLATELETS: 490 10*3/uL — AB (ref 150–400)
RBC: 2.33 MIL/uL — AB (ref 4.22–5.81)
RDW: 21.5 % — AB (ref 11.5–15.5)
WBC: 19.2 10*3/uL — AB (ref 4.0–10.5)

## 2013-06-05 LAB — VANCOMYCIN, TROUGH: VANCOMYCIN TR: 15.9 ug/mL (ref 10.0–20.0)

## 2013-06-05 MED ORDER — LEVOFLOXACIN 750 MG PO TABS: 750.0000 mg | ORAL_TABLET | Freq: Every day | ORAL | Status: DC

## 2013-06-05 MED ORDER — HYDROMORPHONE HCL PF 1 MG/ML IJ SOLN
1.0000 mg | Freq: Once | INTRAMUSCULAR | Status: AC
  Filled 2013-06-05: qty 1

## 2013-06-05 MED ORDER — HYDROMORPHONE HCL PF 2 MG/ML IJ SOLN
4.0000 mg | Freq: Once | INTRAMUSCULAR | Status: AC
  Administered 2013-06-05: 4 mg via INTRAVENOUS
  Filled 2013-06-05: qty 2

## 2013-06-05 MED ORDER — DIPHENHYDRAMINE HCL 50 MG/ML IJ SOLN
25.0000 mg | Freq: Once | INTRAMUSCULAR | Status: AC
  Administered 2013-06-05: 25 mg via INTRAVENOUS
  Filled 2013-06-05: qty 1

## 2013-06-05 MED ORDER — HEPARIN SOD (PORK) LOCK FLUSH 100 UNIT/ML IV SOLN
500.0000 [IU] | INTRAVENOUS | Status: AC | PRN
  Administered 2013-06-05: 500 [IU]

## 2013-06-05 MED ORDER — SODIUM CHLORIDE 0.9 % IJ SOLN
10.0000 mL | Freq: Two times a day (BID) | INTRAMUSCULAR | Status: DC
  Administered 2013-06-05: 10 mL

## 2013-06-05 MED ORDER — HYDROMORPHONE HCL PF 1 MG/ML IJ SOLN
1.0000 mg | Freq: Once | INTRAMUSCULAR | Status: AC
  Administered 2013-06-05: 1 mg via INTRAVENOUS

## 2013-06-05 NOTE — Progress Notes (Addendum)
ANTIBIOTIC CONSULT NOTE - Follow up  Pharmacy Consult for Vancomycin / Zosyn Indication: HCAP  Allergies  Allergen Reactions  . Morphine And Related Hives and Rash    Pt states he can take PO, but not IV and he is able to tolerate Dilaudid with no reactions.   Patient Measurements: Height: 6' (182.9 cm) Weight: 171 lb 4.8 oz (77.7 kg) IBW/kg (Calculated) : 77.6  Vital Signs: Temp: 98.3 F (36.8 C) (05/06 0536) Temp src: Oral (05/06 0536) BP: 136/96 mmHg (05/06 0536) Pulse Rate: 73 (05/06 0536) Intake/Output from previous day: 05/05 0701 - 05/06 0700 In: 3107.5 [P.O.:900; I.V.:1257.5; IV Piggyback:950] Out: 6705 [Urine:6705] Intake/Output from this shift: Total I/O In: -  Out: 500 [Urine:500]  Labs:  Recent Labs  06/03/13 1030 06/04/13 0524 06/05/13 0520  WBC 19.8* 19.2* 19.2*  HGB 7.7* 7.2* 7.6*  PLT 428* 443* 490*  CREATININE 0.54 0.54 0.58   Estimated Creatinine Clearance: 142.8 ml/min (by C-G formula based on Cr of 0.58). No results found for this basename: VANCOTROUGH, Corlis Leak, VANCORANDOM, [place], GENTPEAK, GENTRANDOM, TOBRATROUGH, TOBRAPEAK, TOBRARND, AMIKACINPEAK, AMIKACINTROU, AMIKACIN,  in the last 72 hours   Microbiology: Recent Results (from the past 720 hour(s))  MRSA PCR SCREENING     Status: Abnormal   Collection Time    05/13/13  6:00 PM      Result Value Ref Range Status   MRSA by PCR POSITIVE (*) NEGATIVE Final   Comment:            The GeneXpert MRSA Assay (FDA     approved for NASAL specimens     only), is one component of a     comprehensive MRSA colonization     surveillance program. It is not     intended to diagnose MRSA     infection nor to guide or     monitor treatment for     MRSA infections.     RESULT CALLED TO, READ BACK BY AND VERIFIED WITH:     C.DENNY,RN AT 2320 ON 05/13/13 BY W.SHEA  CULTURE, BLOOD (ROUTINE X 2)     Status: None   Collection Time    05/13/13  8:35 PM      Result Value Ref Range Status   Specimen Description BLOOD RIGHT ARM   Final   Special Requests BOTTLES DRAWN AEROBIC AND ANAEROBIC 4ML   Final   Culture  Setup Time     Final   Value: 05/14/2013 01:08     Performed at [facility]   Culture     Final   Value: NO GROWTH 5 DAYS     Performed at [facility]   Report Status 05/20/2013 FINAL   Final  CULTURE, BLOOD (ROUTINE X 2)     Status: None   Collection Time    05/13/13  8:40 PM      Result Value Ref Range Status   Specimen Description BLOOD RIGHT HAND   Final   Special Requests BOTTLES DRAWN AEROBIC AND ANAEROBIC 4ML   Final   Culture  Setup Time     Final   Value: 05/14/2013 01:08     Performed at [facility]   Culture     Final   Value: NO GROWTH 5 DAYS     Performed at [facility]   Report Status 05/20/2013 FINAL   Final  MRSA PCR SCREENING     Status: None   Collection Time    06/02/13  3:33  PM      Result Value Ref Range Status   MRSA by PCR NEGATIVE  NEGATIVE Final   Comment:            The GeneXpert MRSA Assay (FDA     approved for NASAL specimens     only), is one component of a     comprehensive MRSA colonization     surveillance program. It is not     intended to diagnose MRSA     infection nor to guide or     monitor treatment for     MRSA infections.  CULTURE, BLOOD (ROUTINE X 2)     Status: None   Collection Time    06/02/13  3:54 PM      Result Value Ref Range Status   Specimen Description BLOOD LEFT ARM   Final   Special Requests BOTTLES DRAWN AEROBIC AND ANAEROBIC 10CC   Final   Culture  Setup Time     Final   Value: 06/03/2013 00:11     Performed at [facility]   Culture     Final   Value:        BLOOD CULTURE RECEIVED NO GROWTH TO DATE CULTURE WILL BE HELD FOR 5 DAYS BEFORE ISSUING A FINAL NEGATIVE REPORT     Performed at [facility]   Report Status PENDING   Incomplete  CULTURE, BLOOD (ROUTINE X 2)     Status: None   Collection Time    06/02/13  6:50 PM      Result Value  Ref Range Status   Specimen Description BLOOD PORTA CATH   Final   Special Requests BOTTLES DRAWN AEROBIC AND ANAEROBIC 10CC   Final   Culture  Setup Time     Final   Value: 06/03/2013 02:51     Performed at [facility]   Culture     Final   Value:        BLOOD CULTURE RECEIVED NO GROWTH TO DATE CULTURE WILL BE HELD FOR 5 DAYS BEFORE ISSUING A FINAL NEGATIVE REPORT     Performed at [facility]   Report Status PENDING   Incomplete   Assessment: 1 yoM presents with sickle cell crisis and SOB. PMHx pertinent for recent admission for acute chest syndrome treated with IV abx, discharged on levaquin which patient never filled.  Pharmacy consulted to dose vancomycin and zosyn for HCAP.    Note previous vancomycin trough of 8.8 on Vanc regimen 1g IV q8h with similar creatine levels Last documented weight = 77kg on 05/28/13  5/3 >> vancomycin  >> 5/3 >> zosyn   >>    Tmax: Afebrile WBCs:  24.1-> 19.2 Renal: SCr 0.58  5/3 blood: ngtd  Goal of Therapy:  Vancomycin trough level 15-20 mcg/ml Eradication of infection  Plan:  Zosyn 3.375g IV q8h (infuse over 4 hours) Vancomycin 2g IV x1, then 1250mg  IV q8h based on previous VT levels Check Vanc trough today @ 1300  Minda Ditto PharmD Pager [phone] 06/05/2013, 11:38 AM   Addendum: Vanc trough today is 15.9 mcg/ml on 1250mg  q8hr Goal range is 15-20 mcg/ml No change Vancomycin

## 2013-06-05 NOTE — Progress Notes (Addendum)
Pt complained that lock out/max limit of PCA was not high enough. Pt asked that nurse page the MD to increase amount of bolus/hourly max. RN paged MD, who gave a 1 time dose of 4mg  IV Dilaudid but did not increase the PCA dosing. RN administered 4mg  IV Dilaudid and when RN attempted to explain that the max was there to prevent overdosing, pt became combative and agitated. Pt interrupted nurse's education, raised his voice loudly, and yelled that the RN was not foing enough to control his pain. RN left the room to avoid further escalation. Pt check on 20 minutes later and he was resting comfortably, sleeping, in no apparent distress.   Later in afternoon, pt again complained that max dose was not enough. He demanded that the nurse page the MD immediately to either increase the dose or to discharge him. RN explained that she would speak with the MD but needed to know what his pain score was and where exactly he was hurting. Pt continued to yell and become physically agitated with situation. RN left the room to avoid further escalation and pt continued to yell at RN. MD paged and MD came to unit to speak with pt. MD wrote for one time doses of 4mg  IV Dilaudid and 25mg  IV Benadryl. Before nurse could acknowledge order on computer and before pharmacy could approve order, patient began repeatedly calling the front desk demanding his IV pain meds and IV benadryl. RN removed and administered the IV medications as soon as humanely possible. Pt continued to be agitated with nurse's presence, exhibiting behaviors such a motioning for RN to leave the room and making comments indiscernible by RN under his breath.   MD discharged pt. AVS reviewed with pt. Pt offered no questions and both pt and RN signed paperwork. OV therapy removed portacath with no issues. Pt d/c to home.

## 2013-06-05 NOTE — Care Management Note (Signed)
    Page 1 of 2   06/05/2013     3:13:21 PM CARE MANAGEMENT NOTE 06/05/2013  Patient:  [name], [name]   Account Number:  [id number]  Date Initiated:  06/03/2013  Documentation initiated by:  DAVIS,RHONDA  Subjective/Objective Assessment:   hx of scc, now with chest pain and positive cardiac enz's, wbc 27.5 hypoxic, pain not controlled     Action/Plan:   home when stable   Anticipated DC Date:  06/06/2013   Anticipated DC Plan:  HOME/SELF CARE  In-house referral  NA      DC Planning Services  CM consult      PAC Choice  NA   Choice offered to / List presented to:  NA   DME arranged  NA      DME agency  NA     [place] arranged  NA      [place] agency  NA   Status of service:  In process, will continue to follow Medicare Important Message given?  YES (If response is "NO", the following Medicare IM given date fields will be blank) Date Medicare IM given:  06/04/2013 Date Additional Medicare IM given:  06/05/2013  Discharge Disposition:    Per UR Regulation:  Reviewed for med. necessity/level of care/duration of stay  If discussed at [place] of Stay Meetings, dates discussed:    Comments:  06/04/13 Siddarth Hsiung RN,BSN NCM [place].NO ANTICIPATED D/C NEEDS.  [place], RN, BSN, [place] [phone] Chart Reviewed for discharge and hospital needs. Discharge needs at time of review: None present will follow for needs. Review of patient progress due on [id number]. [id number]/Important Medicare message reviewed with patient or representative and signed, copy to patient and one to the chart./Rhonda Rosana Hoes, RN,BSN,CCM.  [phone]

## 2013-06-05 NOTE — Discharge Summary (Signed)
Physician Discharge Summary  Patient ID: [name] MRN: [id number] DOB/AGE: [birth year] 33 y.o.  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 Healthcare associated pneumonia was thought to be the case. In addition he he had acute sickle cell crisis from it. He was treated with a combination of vancomycin and Zosyn and Levaquin. He was later transitioned to Levaquin orally to take at home. His hypoxia has resolved. He is also back to his baseline. He received IV Dilaudid, IV Toradol as well as his oral medications. He also received hydration aggressively. At the time of discharge he was follow up with Dr. Lamonte Sakai the pulmonologist as well as Dr. Zigmund Daniel in July. His prescriptions were given to him.  Consults: pulmonary/intensive care  Significant Diagnostic Studies: labs: Chest x-ray, as CBCs and CMP were checked  Treatments: IV hydration, antibiotics: vancomycin, Zosyn and Levaquin and analgesia: Dilaudid  Discharge Exam: Blood pressure 136/96, pulse 73, temperature 98.3 F (36.8 C), temperature source Oral, resp. rate 16, height 6' (1.829 m), weight 77.7 kg (171 lb 4.8 oz), SpO2 99.00%. General appearance: alert, cooperative and no distress Eyes: conjunctivae/corneas clear. PERRL, EOM's intact. Fundi benign. Throat: lips, mucosa, and tongue normal; teeth and gums normal Neck: no adenopathy, no carotid bruit, no JVD, supple, symmetrical, trachea midline and thyroid not enlarged, symmetric, no tenderness/mass/nodules Back: symmetric, no curvature.  ROM normal. No CVA tenderness. Resp: clear to auscultation bilaterally Chest wall: no tenderness Cardio: regular rate and rhythm, S1, S2 normal, no murmur, click, rub or gallop GI: soft, non-tender; bowel sounds normal; no masses,  no organomegaly Extremities: extremities normal, atraumatic, no cyanosis or edema Pulses: 2+ and symmetric Skin: Skin color, texture, turgor normal. No rashes or lesions Neurologic: Grossly normal  Disposition: 01-Home or Self Care   Future Appointments Provider Department Dept Phone   06/25/2013 3:00 PM Collene Gobble, MD [facility] [phone]   08/22/2013 11:30 AM [name], MD [place] [phone]       Medication List    ASK your doctor about these medications       celecoxib 200 MG capsule  Commonly known as:  CELEBREX  Take 1 capsule (200 mg total) by mouth 2 (two) times daily.     ergocalciferol 50000 UNITS capsule  Commonly known as:  VITAMIN D2  Take 50,000 Units by mouth once a week. On Tuesday     folic acid 1 MG tablet  Commonly known as:  FOLVITE  Take 1 tablet (1 mg total) by mouth every morning.     HYDROmorphone 4 MG tablet  Commonly known as:  DILAUDID  Take 1 tablet (4 mg total) by mouth every 4 (four) hours as needed for severe pain.     hydroxyurea 500 MG capsule  Commonly known as:  HYDREA  Take 1,000 mg by mouth daily. May take with food to minimize GI side effects.     morphine 15 MG 12 hr tablet  Commonly known as:  MS CONTIN  Take 1 tablet (15 mg total) by mouth 2 (two) times daily.     potassium chloride SA  20 MEQ tablet  Commonly known as:  K-DUR,KLOR-CON  Take 1 tablet (20 mEq total) by mouth every morning.     XARELTO 20 MG Tabs tablet  Generic drug:  rivaroxaban  Take 20 mg by mouth daily with supper.     zolpidem 10 MG tablet  Commonly known as:  AMBIEN  Take 1 tablet (10 mg total) by mouth at bedtime as needed for sleep.         Signed: Mohammad L  [name] 06/05/2013, 3:30 PM  Time spent 37 minutes

## 2013-06-07 ENCOUNTER — Telehealth: Payer: Self-pay | Admitting: Internal Medicine

## 2013-06-07 NOTE — Telephone Encounter (Signed)
Left voicemail for patient to call to schedule follow up appointment.  °

## 2013-06-09 LAB — CULTURE, BLOOD (ROUTINE X 2)
CULTURE: NO GROWTH
Culture: NO GROWTH

## 2013-06-14 ENCOUNTER — Emergency Department (HOSPITAL_COMMUNITY)
Admission: EM | Admit: 2013-06-14 | Discharge: 2013-06-14 | Disposition: A | Discharge status: Home / Self Care | Payer: Medicare Other | Attending: Emergency Medicine | Admitting: Emergency Medicine

## 2013-06-14 ENCOUNTER — Encounter (HOSPITAL_COMMUNITY): Payer: Self-pay | Admitting: Hematology

## 2013-06-14 ENCOUNTER — Non-Acute Institutional Stay (HOSPITAL_COMMUNITY)
Admission: AD | Admit: 2013-06-14 | Discharge: 2013-06-14 | Disposition: A | Discharge status: Home / Self Care | Payer: Medicare Other | Attending: Family Medicine | Admitting: Family Medicine

## 2013-06-14 ENCOUNTER — Encounter (HOSPITAL_COMMUNITY): Payer: Self-pay | Admitting: Emergency Medicine

## 2013-06-14 DIAGNOSIS — Z8659 Personal history of other mental and behavioral disorders: Secondary | ICD-10-CM | POA: Diagnosis not present

## 2013-06-14 DIAGNOSIS — Z87891 Personal history of nicotine dependence: Secondary | ICD-10-CM | POA: Insufficient documentation

## 2013-06-14 DIAGNOSIS — E876 Hypokalemia: Secondary | ICD-10-CM

## 2013-06-14 DIAGNOSIS — Z862 Personal history of diseases of the blood and blood-forming organs and certain disorders involving the immune mechanism: Secondary | ICD-10-CM | POA: Insufficient documentation

## 2013-06-14 DIAGNOSIS — Z8619 Personal history of other infectious and parasitic diseases: Secondary | ICD-10-CM | POA: Diagnosis not present

## 2013-06-14 DIAGNOSIS — Z792 Long term (current) use of antibiotics: Secondary | ICD-10-CM | POA: Diagnosis not present

## 2013-06-14 DIAGNOSIS — Z7901 Long term (current) use of anticoagulants: Secondary | ICD-10-CM | POA: Insufficient documentation

## 2013-06-14 DIAGNOSIS — Z8739 Personal history of other diseases of the musculoskeletal system and connective tissue: Secondary | ICD-10-CM | POA: Diagnosis not present

## 2013-06-14 DIAGNOSIS — I10 Essential (primary) hypertension: Secondary | ICD-10-CM | POA: Insufficient documentation

## 2013-06-14 DIAGNOSIS — F1290 Cannabis use, unspecified, uncomplicated: Secondary | ICD-10-CM

## 2013-06-14 DIAGNOSIS — D72829 Elevated white blood cell count, unspecified: Secondary | ICD-10-CM

## 2013-06-14 DIAGNOSIS — Z79899 Other long term (current) drug therapy: Secondary | ICD-10-CM | POA: Insufficient documentation

## 2013-06-14 DIAGNOSIS — D5700 Hb-SS disease with crisis, unspecified: Secondary | ICD-10-CM

## 2013-06-14 DIAGNOSIS — Z87738 Personal history of other specified (corrected) congenital malformations of digestive system: Secondary | ICD-10-CM | POA: Diagnosis not present

## 2013-06-14 DIAGNOSIS — Z791 Long term (current) use of non-steroidal anti-inflammatories (NSAID): Secondary | ICD-10-CM | POA: Insufficient documentation

## 2013-06-14 DIAGNOSIS — J984 Other disorders of lung: Secondary | ICD-10-CM | POA: Diagnosis not present

## 2013-06-14 DIAGNOSIS — R109 Unspecified abdominal pain: Secondary | ICD-10-CM | POA: Diagnosis not present

## 2013-06-14 DIAGNOSIS — Z86711 Personal history of pulmonary embolism: Secondary | ICD-10-CM | POA: Insufficient documentation

## 2013-06-14 DIAGNOSIS — Z9089 Acquired absence of other organs: Secondary | ICD-10-CM | POA: Insufficient documentation

## 2013-06-14 DIAGNOSIS — D473 Essential (hemorrhagic) thrombocythemia: Secondary | ICD-10-CM | POA: Insufficient documentation

## 2013-06-14 DIAGNOSIS — Z8639 Personal history of other endocrine, nutritional and metabolic disease: Secondary | ICD-10-CM | POA: Insufficient documentation

## 2013-06-14 DIAGNOSIS — E559 Vitamin D deficiency, unspecified: Secondary | ICD-10-CM

## 2013-06-14 DIAGNOSIS — R079 Chest pain, unspecified: Secondary | ICD-10-CM | POA: Insufficient documentation

## 2013-06-14 DIAGNOSIS — Z86718 Personal history of other venous thrombosis and embolism: Secondary | ICD-10-CM | POA: Insufficient documentation

## 2013-06-14 DIAGNOSIS — R17 Unspecified jaundice: Secondary | ICD-10-CM | POA: Insufficient documentation

## 2013-06-14 DIAGNOSIS — Z96649 Presence of unspecified artificial hip joint: Secondary | ICD-10-CM | POA: Insufficient documentation

## 2013-06-14 DIAGNOSIS — F1210 Cannabis abuse, uncomplicated: Secondary | ICD-10-CM

## 2013-06-14 LAB — CBC
HEMATOCRIT: 22 % — AB (ref 39.0–52.0)
Hemoglobin: 7.6 g/dL — ABNORMAL LOW (ref 13.0–17.0)
MCH: 33.2 pg (ref 26.0–34.0)
MCHC: 34.5 g/dL (ref 30.0–36.0)
MCV: 96.1 fL (ref 78.0–100.0)
Platelets: 553 10*3/uL — ABNORMAL HIGH (ref 150–400)
RBC: 2.29 MIL/uL — AB (ref 4.22–5.81)
RDW: 24 % — ABNORMAL HIGH (ref 11.5–15.5)
WBC: 16.8 10*3/uL — ABNORMAL HIGH (ref 4.0–10.5)

## 2013-06-14 LAB — BASIC METABOLIC PANEL
BUN: 4 mg/dL — ABNORMAL LOW (ref 6–23)
CALCIUM: 8.7 mg/dL (ref 8.4–10.5)
CO2: 23 meq/L (ref 19–32)
Chloride: 103 mEq/L (ref 96–112)
Creatinine, Ser: 0.69 mg/dL (ref 0.50–1.35)
GFR calc Af Amer: >90 mL/min (ref >90)
Glucose, Bld: 100 mg/dL — ABNORMAL HIGH (ref 70–99)
POTASSIUM: 3.6 meq/L — AB (ref 3.7–5.3)
SODIUM: 139 meq/L (ref 137–147)

## 2013-06-14 LAB — RETICULOCYTES
RBC.: 2.29 MIL/uL — AB (ref 4.22–5.81)
RETIC COUNT ABSOLUTE: 336.6 10*3/uL — AB (ref 19.0–186.0)
Retic Ct Pct: 14.7 % — ABNORMAL HIGH (ref 0.4–3.1)

## 2013-06-14 MED ORDER — FOLIC ACID 1 MG PO TABS: 1.0000 mg | ORAL_TABLET | Freq: Every morning | ORAL | Status: DC

## 2013-06-14 MED ORDER — DIPHENHYDRAMINE HCL 25 MG PO CAPS
25.0000 mg | ORAL_CAPSULE | Freq: Four times a day (QID) | ORAL | Status: DC | PRN
  Administered 2013-06-14: 25 mg via ORAL
  Filled 2013-06-14: qty 1

## 2013-06-14 MED ORDER — SODIUM CHLORIDE 0.9 % IJ SOLN: 9.0000 mL | INTRAMUSCULAR | Status: DC | PRN

## 2013-06-14 MED ORDER — HYDROMORPHONE HCL PF 1 MG/ML IJ SOLN
1.0000 mg | Freq: Once | INTRAMUSCULAR | Status: AC
  Administered 2013-06-14: 1 mg via INTRAVENOUS
  Filled 2013-06-14: qty 1

## 2013-06-14 MED ORDER — DEXTROSE-NACL 5-0.45 % IV SOLN: INTRAVENOUS | Status: DC

## 2013-06-14 MED ORDER — HYDROMORPHONE HCL 4 MG PO TABS
4.0000 mg | ORAL_TABLET | Freq: Once | ORAL | Status: AC
Start: 2013-06-14 — End: 2013-06-14
  Administered 2013-06-14: 4 mg via ORAL
  Filled 2013-06-14: qty 1

## 2013-06-14 MED ORDER — SODIUM CHLORIDE 0.9 % IJ SOLN
10.0000 mL | INTRAMUSCULAR | Status: AC | PRN
  Administered 2013-06-14: 10 mL

## 2013-06-14 MED ORDER — MORPHINE SULFATE ER 15 MG PO TBCR
15.0000 mg | EXTENDED_RELEASE_TABLET | Freq: Two times a day (BID) | ORAL | Status: DC
  Administered 2013-06-14: 15 mg via ORAL
  Filled 2013-06-14: qty 1

## 2013-06-14 MED ORDER — SODIUM CHLORIDE 0.9 % IV SOLN
INTRAVENOUS | Status: DC
  Administered 2013-06-14: 05:00:00 via INTRAVENOUS

## 2013-06-14 MED ORDER — HYDROMORPHONE HCL PF 2 MG/ML IJ SOLN
1.8000 mg | Freq: Once | INTRAMUSCULAR | Status: AC
  Administered 2013-06-14: 1.8 mg via INTRAVENOUS
  Filled 2013-06-14: qty 1

## 2013-06-14 MED ORDER — POTASSIUM CHLORIDE CRYS ER 20 MEQ PO TBCR
40.0000 meq | EXTENDED_RELEASE_TABLET | Freq: Once | ORAL | Status: AC
  Administered 2013-06-14: 40 meq via ORAL
  Filled 2013-06-14: qty 2

## 2013-06-14 MED ORDER — HYDROXYZINE HCL 25 MG PO TABS: 25.0000 mg | ORAL_TABLET | ORAL | Status: DC | PRN

## 2013-06-14 MED ORDER — DIPHENHYDRAMINE HCL 12.5 MG/5ML PO ELIX: 12.5000 mg | ORAL_SOLUTION | Freq: Four times a day (QID) | ORAL | Status: DC | PRN

## 2013-06-14 MED ORDER — NALOXONE HCL 0.4 MG/ML IJ SOLN: 0.4000 mg | INTRAMUSCULAR | Status: DC | PRN

## 2013-06-14 MED ORDER — ONDANSETRON HCL 4 MG/2ML IJ SOLN: 4.0000 mg | INTRAMUSCULAR | Status: DC | PRN

## 2013-06-14 MED ORDER — HYDROMORPHONE HCL PF 2 MG/ML IJ SOLN
2.2000 mg | Freq: Once | INTRAMUSCULAR | Status: AC
  Administered 2013-06-14: 2.2 mg via INTRAVENOUS
  Filled 2013-06-14: qty 2

## 2013-06-14 MED ORDER — PANTOPRAZOLE SODIUM 40 MG IV SOLR
40.0000 mg | Freq: Every day | INTRAVENOUS | Status: DC
  Administered 2013-06-14: 40 mg via INTRAVENOUS
  Filled 2013-06-14: qty 40

## 2013-06-14 MED ORDER — DIPHENHYDRAMINE HCL 50 MG/ML IJ SOLN
12.5000 mg | Freq: Four times a day (QID) | INTRAMUSCULAR | Status: DC | PRN
  Administered 2013-06-14: 12.5 mg via INTRAVENOUS
  Filled 2013-06-14: qty 1

## 2013-06-14 MED ORDER — DEXTROSE-NACL 5-0.45 % IV SOLN
INTRAVENOUS | Status: DC
  Administered 2013-06-14: 13:00:00 via INTRAVENOUS

## 2013-06-14 MED ORDER — PANTOPRAZOLE SODIUM 40 MG PO TBEC
40.0000 mg | DELAYED_RELEASE_TABLET | Freq: Every day | ORAL | Status: DC
  Filled 2013-06-14: qty 1

## 2013-06-14 MED ORDER — POTASSIUM CHLORIDE CRYS ER 20 MEQ PO TBCR: 20.0000 meq | EXTENDED_RELEASE_TABLET | Freq: Every morning | ORAL | Status: DC

## 2013-06-14 MED ORDER — HEPARIN SOD (PORK) LOCK FLUSH 100 UNIT/ML IV SOLN
500.0000 [IU] | INTRAVENOUS | Status: AC | PRN
  Administered 2013-06-14: 500 [IU]
  Filled 2013-06-14: qty 5

## 2013-06-14 MED ORDER — RIVAROXABAN 20 MG PO TABS: 20.0000 mg | ORAL_TABLET | Freq: Every day | ORAL | Status: DC

## 2013-06-14 MED ORDER — ONDANSETRON HCL 4 MG PO TABS: 4.0000 mg | ORAL_TABLET | ORAL | Status: DC | PRN

## 2013-06-14 MED ORDER — HEPARIN SOD (PORK) LOCK FLUSH 100 UNIT/ML IV SOLN
500.0000 [IU] | Freq: Once | INTRAVENOUS | Status: AC
  Administered 2013-06-14: 500 [IU]
  Filled 2013-06-14: qty 5

## 2013-06-14 MED ORDER — SORBITOL 70 % SOLN
30.0000 mL | Freq: Every day | Status: DC | PRN
  Filled 2013-06-14: qty 30

## 2013-06-14 MED ORDER — ONDANSETRON HCL 4 MG/2ML IJ SOLN: 4.0000 mg | Freq: Four times a day (QID) | INTRAMUSCULAR | Status: DC | PRN

## 2013-06-14 MED ORDER — HYDROMORPHONE 0.3 MG/ML IV SOLN
INTRAVENOUS | Status: DC
  Administered 2013-06-14: 14:00:00 via INTRAVENOUS
  Administered 2013-06-14: 6.99 mg via INTRAVENOUS
  Filled 2013-06-14: qty 25

## 2013-06-14 MED ORDER — KETOROLAC TROMETHAMINE 30 MG/ML IJ SOLN
30.0000 mg | Freq: Once | INTRAMUSCULAR | Status: AC
  Administered 2013-06-14: 30 mg via INTRAVENOUS
  Filled 2013-06-14: qty 1

## 2013-06-14 MED ORDER — ONDANSETRON HCL 4 MG/2ML IJ SOLN
4.0000 mg | Freq: Once | INTRAMUSCULAR | Status: AC
  Administered 2013-06-14: 4 mg via INTRAVENOUS
  Filled 2013-06-14: qty 2

## 2013-06-14 MED ORDER — HYDROMORPHONE HCL PF 2 MG/ML IJ SOLN
2.8000 mg | Freq: Once | INTRAMUSCULAR | Status: AC
  Administered 2013-06-14: 2.8 mg via INTRAVENOUS
  Filled 2013-06-14: qty 2

## 2013-06-14 MED ORDER — HYDROMORPHONE HCL PF 1 MG/ML IJ SOLN: 1.0000 mg | Freq: Once | INTRAMUSCULAR | Status: DC

## 2013-06-14 MED ORDER — HYDROXYUREA 500 MG PO CAPS: 1000.0000 mg | ORAL_CAPSULE | Freq: Every day | ORAL | Status: DC

## 2013-06-14 NOTE — Progress Notes (Signed)
Patient ID: [name], male   DOB: 1980-08-29, 33 y.o.   MRN: [id number] Pt discharged to home; port deaccessed with no complications noted; discharge instructions explained, given, and signed; all questions answered

## 2013-06-14 NOTE — ED Notes (Signed)
Per pt report: pt c/o of pain in his legs, ribs, and back.  Pt reports pain is his normal sickle cell pain.  Pt reports pain began about a day or two ago.  Pt a/o x 4.  Skin warm and dry.

## 2013-06-14 NOTE — Discharge Summary (Signed)
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 He says that the pain has been present for about 2 days and he ended up going to the ER earlier this morning at about 4am. He was treated with IV pain meds and he was able to be discharged home. Unfortunately, several hours later the patient reports that his pain began to escalate to 7-8/10 and he came over to the day hospital for further treatment. He reports that people at home have been telling him that his eyes have become more yellow over the past couple of days. He denies shortness of breath and chest pain but he does have significant pain in right rib cage and it hurts to take a deep breath sometimes because of the pain in his rib cage. Also, he noted that his urine has become darker in color   Hospital Course:  Patient was [facility] for [facility]. Initiated hypotonic IVFs for cellular rehydration. Patient was started on IV Toradol and IV dilaudid per weight based rapid re-dosing. Pain intensity remained greater than 7/10 so he transitioned to weight based PCA dilaudid . Patient used a total of 6.99 mg with 12 demands and 10 deliveries.  Pain intensity decreased to 5/10 during extended observation. He states that he can function at home on current medication regimen. Patient was given  Dilaudid 4 mg po 30 minutes prior to discharge. Patient stable and discharged home accompanied by his brother. Patient will follow up with Dr. Zigmund Daniel on 06/18/2013 and has appointments scheduled with cardiologist and pulmonologist.   Discharge Exam:  Filed Vitals:   06/14/13 1605  BP:   Pulse:   Temp:   Resp: 16   Filed Vitals:   06/14/13 1411 06/14/13 1503 06/14/13 1603 06/14/13 1605  BP:  125/66    Pulse:  77 88   Temp:      TempSrc:      Resp: 16 27 23 16   SpO2: 99% 90% 91% 90%  Constitutional: Appears well-developed and well-nourished. No distress.  HENT: Normocephalic. External right and left ear normal.  Eyes: Conjunctivae and EOM are normal. PERRLA, pronounced bilateral scleral icterus.  Neck: Normal ROM. Neck supple. No JVD. No tracheal deviation.  CVS: tachycardic, S1/S2 +, no murmurs, no gallops, no carotid bruit.  Pulmonary: Effort and breath sounds normal, no stridor, rhonchi, wheezes, rales.  Chest: right chest wall rib cage pain with palpation, no bruising seen.  Abdominal: Soft. BS +, no distension, tenderness, rebound or guarding.  Musculoskeletal: Normal range of motion. No edema and no tenderness.  Lymphadenopathy: No lymphadenopathy noted, cervical, inguinal. Neuro: Alert. Normal reflexes, muscle tone coordination. No cranial nerve deficit.  Skin: Skin is warm and dry.  Psychiatric: Normal mood and affect   Discharge Instructions     Medication List    TAKE these medications  celecoxib 200 MG capsule  Commonly known as:  CELEBREX  Take 1 capsule (200 mg total) by mouth 2 (two) times daily.     ergocalciferol 50000 UNITS capsule  Commonly known as:  VITAMIN D2  Take 50,000 Units by mouth once a week. On Tuesday     folic acid 1 MG tablet  Commonly known as:  FOLVITE  Take 1 tablet (1 mg total) by mouth every morning.     HYDROmorphone 4 MG tablet  Commonly known as:  DILAUDID  Take 1 tablet (4 mg total) by mouth every 4 (four) hours as  needed for severe pain.     hydroxyurea 500 MG capsule  Commonly known as:  HYDREA  Take 1,000 mg by mouth daily. May take with food to minimize GI side effects.     morphine 15 MG 12 hr tablet  Commonly known as:  MS CONTIN  Take 1 tablet (15 mg total) by mouth 2 (two) times daily.     potassium chloride SA 20 MEQ tablet  Commonly known as:  K-DUR,KLOR-CON  Take 1 tablet (20 mEq total) by mouth every morning.     XARELTO 20 MG Tabs tablet  Generic drug:  rivaroxaban  Take 20 mg by mouth daily with supper.     zolpidem 10 MG tablet  Commonly known as:  AMBIEN  Take 1 tablet (10 mg total) by mouth at bedtime as needed for sleep.      ASK your doctor about these medications       levofloxacin 750 MG tablet  Commonly known as:  LEVAQUIN  Take 1 tablet (750 mg total) by mouth daily.          The results of significant diagnostics from this hospitalization (including imaging, microbiology, ancillary and laboratory) are listed below for reference.    Significant Diagnostic Studies: Dg Chest 2 View  06/02/2013   CLINICAL DATA:  Sickle cell crisis.  Shortness of breath.  EXAM: CHEST  2 VIEW  COMPARISON:  05/14/2013  FINDINGS: Left chest wall port a catheter is noted with tip in the cavoatrial junction. The heart size is enlarged. The lung volumes are low and there is scarring within both lung bases. No pleural effusions or edema. No airspace consolidation.  IMPRESSION: 1. Low lung volumes with bibasilar scarring.  . 2. Cardiac enlargement.   Electronically Signed   By: Kerby Moors M.D.   On: 06/02/2013 09:36   Ct Angio Chest Pe W/cm &/or Wo Cm  06/02/2013   CLINICAL DATA:  Shortness of breath, chest pain, hypoxia, tachycardia, history of sickle cell anemia, evaluate for pulmonary embolism  EXAM: CT ANGIOGRAPHY CHEST WITH CONTRAST  TECHNIQUE: Multidetector CT imaging of the chest was performed using the standard protocol during bolus administration of intravenous contrast.  Multiplanar CT image reconstructions and MIPs were obtained to evaluate the vascular anatomy.  CONTRAST:  138mL OMNIPAQUE IOHEXOL 350 MG/ML SOLN  COMPARISON:  CT ANGIO CHEST W/CM &/OR WO/CM dated 05/18/2013; CT ANGIO CHEST W/CM &/OR WO/CM dated 03/26/2013; CT ANGIO CHEST W/CM &/OR WO/CM dated 11/13/2012; CT ANGIO CHEST W/CM &/OR WO/CM dated 08/27/2012  FINDINGS: Vascular Findings:  There is suboptimal adequate opacification of the pulmonary arterial system with the main pulmonary artery measuring only 187 Hounsfield units. Given this limitation, there are no discrete filling defects within the pulmonary arterial tree to the level of the bilateral subsegmental pulmonary arteries. Evaluation of distal subsegmental pulmonary arteries is degraded secondary to suboptimal vessel opacification and patient respiratory  artifact. There is grossly unchanged marked enlargement of the main pulmonary artery measuring approximately 38 mm in diameter.  Cardiomegaly. No pericardial effusion. Normal caliber of the thoracic aorta. A Bentson configuration of the aortic arch. Branch vessels of the aortic arch widely patent throughout their imaged course. No definite thoracic aortic dissection or periaortic stranding.  There is a left anterior chest wall subclavian vein approach port a catheter with tip terminating within the superior aspect of the right atrium.  Review of the MIP images confirms the above findings.   ----------------------------------------------------------------------------------  Nonvascular Findings:  Evaluation of the pulmonary parenchyma is degraded secondary to patient respiratory artifact.  Interval development / worsening of a ill-defined potentially partially cavitary approximately 2.0 x 1.4 cm opacity within the peripheral aspect of the superior segment of the right lower lobe (image 67, series 9). There is an additional ill-defined area of ground-glass within the subpleural aspect of the right lower lobe (image  65, series 9).  There is grossly symmetric subpleural dependent subsegmental atelectasis within the bilateral lower lobes and caudal segment of the lingula. No pleural effusion or pneumothorax. The central pulmonary airways appear widely patent. Shotty prevascular lymph nodes are individually not enlarged by size criteria with index prevascular lymph node measuring approximately 0.9 cm in greatest short axis diameter (image 46, series 6). No mediastinal, hilar or axillary lymphadenopathy.  Early arterial phase evaluation of the upper abdomen is normal.  No definite acute or aggressive osseous abnormalities. Accentuated kyphosis within in the superior aspect of the thoracic spine, likely positional. Stigmata of sickle cell disease affecting the imaged osseous structures of the chest including mild diffuse increased slightly mottled sclerosis and fish-mouth deformities about multiple intervertebral disc spaces within in the mid cranial and mid aspects of the thoracic spine.  Thyroid gland appears normal given obliquity. Regional soft tissues appear normal.  IMPRESSION: 1. No evidence of pulmonary embolism to the level of the bilateral subsegmental pulmonary arteries. 2. Interval development of airspace opacities within the right lower and to a lesser extent, the right middle lobes, worrisome for developing areas of infection and/or acute chest syndrome in this patient with known sickle cell disease. 3. Cardiomegaly with enlargement of the caliber of the main pulmonary artery, nonspecific though could be seen in the setting of pulmonary arterial hypertension. Further evaluation could be performed with cardiac echo as clinically indicated.   Electronically Signed   By: Sandi Mariscal M.D.   On: 06/02/2013 12:02   Ct Angio Chest Pe W/cm &/or Wo Cm  05/18/2013   CLINICAL DATA:  Sickle-cell crisis with chest pain. Evaluate for pulmonary embolus.  EXAM: CT ANGIOGRAPHY CHEST WITH CONTRAST  TECHNIQUE: Multidetector CT imaging  of the chest was performed using the standard protocol during bolus administration of intravenous contrast. Multiplanar CT image reconstructions and MIPs were obtained to evaluate the vascular anatomy.  CONTRAST:  139mL OMNIPAQUE IOHEXOL 350 MG/ML SOLN  COMPARISON:  Chest x-ray 05/14/2013.  CT chest 03/26/2013.  FINDINGS: Mediastinum: No filling defects are noted within the pulmonary arterial tree to suggest pulmonary emboli. Heart size is increased. No pericardial fluid, thickening or calcification. No acute abnormality of the thoracic aorta or other great vessels of the mediastinum. Mild right hilar and mediastinal lymph node prominence, stable. The esophagus is normal in appearance. Layering mucus within the proximal right mainstem bronchus. Stable Port-A-Cath.  Lungs/Pleura: No pleural effusions. No suspicious appearing pulmonary nodules or masses. Peripheral areas of ground-glass opacity in both lungs, likely representing a acute chest syndrome.  Small vessel infarction with hemorrhagic edema not excluded. No definite bronchopneumonia.  Upper Abdomen: Visualized portions of the upper abdomen are unremarkable. 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  Musculoskeletal: No aggressive appearing lytic or blastic lesions are noted in the visualized portions of the skeleton. Sickle cell endplate infarctions.  Review of the MIP images confirms the above findings.  IMPRESSION: No pulmonary embolus. Stable peripheral ground-glass opacities, without definite bronchopneumonia.   Electronically Signed   By: Rolla Flatten M.D.   On: 05/18/2013 09:16    Microbiology: No results found for this or any previous visit (from the past 240 hour(s)).   Labs: Basic Metabolic Panel:  Recent Labs Lab 06/14/13 0454  NA 139  K 3.6*  CL 103  CO2 23  GLUCOSE 100*  BUN 4*  CREATININE 0.69  CALCIUM 8.7   Liver Function Tests: No results found for this basename: AST, ALT, ALKPHOS,  BILITOT, PROT, ALBUMIN,  in the last 168 hours No results found for this basename: LIPASE, AMYLASE,  in the last 168 hours No results found for this basename: AMMONIA,  in the last 168 hours CBC:  Recent Labs Lab 06/14/13 0454  WBC 16.8*  HGB 7.6*  HCT 22.0*  MCV 96.1  PLT 553*   Cardiac Enzymes: No results found for this basename: CKTOTAL, CKMB, CKMBINDEX, TROPONINI,  in the last 168 hours BNP: No components found with this basename: POCBNP,  CBG: No results found for this basename: GLUCAP,  in the last 168 hours Ferritin: No results found for this basename: FERRITIN,  in the last 168 hours  Time coordinating discharge: 45  Signed:  Dorena Dew  06/14/2013, 4:35 PM

## 2013-06-14 NOTE — H&P (Signed)
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  He says that the pain has been present for about 2 days and he ended up going to the ER earlier this morning at about 4am.  He was treated with IV pain meds and he was able to be discharged home.  Unfortunately, several hours later the patient reports that his pain began to escalate to 7-8/10 and he came over to the day hospital for further treatment.  He reports that people at home have been telling him that his eyes have become more yellow over the past couple of days.  He denies shortness of breath and chest pain but he does have significant pain in right rib cage and it hurts to take a deep breath sometimes because of the pain in his rib cage.  Also, he noted that his urine has become darker in color.    Review of Systems:  Constitutional: No weight loss, night sweats, Fevers, chills, fatigue.  HEENT: No headaches, dizziness, seizures, vision changes, difficulty swallowing,Tooth/dental problems,Sore throat, No sneezing, itching, ear ache, nasal congestion, post nasal drip,  Cardio-vascular: No chest pain, Orthopnea, PND, swelling in lower extremities, anasarca, dizziness, palpitations  GI: No heartburn, indigestion, abdominal pain, nausea, vomiting, diarrhea, change in bowel habits, loss of appetite  Resp: No shortness of breath with exertion or at rest. No excess mucus, no productive cough, No non-productive cough, No coughing up of blood.No change in color of mucus.No wheezing.No chest wall deformity  Skin: occasional pruritus.  Yellowing of eyes.  no rash or lesions.  GU: no dysuria, change in color of urine, no  urgency or frequency. No flank pain.  Musculoskeletal: Pain in right rib cage as noted. No joint pain or swelling. No decreased range of motion. No back pain.  Psych: No change in mood or affect. No depression or anxiety. No memory loss.   Past Medical History  Diagnosis Date  . Sickle cell anemia   . Blood transfusion   . 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 Hypokalemia   . Mood disorder   . History of pulmonary embolus (PE)   . Avascular necrosis   . Leukocytosis     Chronic  . Thrombocytosis     Chronic  . Hypertension   . History of Clostridium difficile infection   . Uses marijuana   . Chronic anticoagulation   . Functional asplenia   . Former smoker   . Second hand tobacco smoke exposure   . Alcohol consumption of one to four drinks per day    Past Surgical History  Procedure Laterality Date  . Right hip replacement      08/2006  . Cholecystectomy      01/2008  . Porta cath placement    . Porta cath removal    . Umbilical hernia repair      01/2008  . Excision of left periauricular cyst      10/2009  . Excision of right ear lobe cyst with primary closur      11/2007  . Portacath placement  01/05/2012    Procedure: INSERTION PORT-A-CATH;  Surgeon: Rhunette Croft  Rosenbower, MD;  Location: [facility];  Service: General;  Laterality: N/A;  ultrasound guiced port a cath insertion with fluoroscopy   Social History:  reports that he quit smoking about 2 years ago. His smoking use included Cigarettes. He smoked 0.00 packs per day for 13 years. He has never used smokeless tobacco. He reports that he drinks about 3.6 ounces of alcohol per week. He reports that he uses illicit drugs (Marijuana) about twice per week.  Allergies  Allergen Reactions  . Morphine And Related Hives and Rash    Pt states he can take PO, but not IV and he is able to tolerate Dilaudid with no reactions.    Family History  Problem Relation Age of Onset  . Sickle cell anemia Mother    . Sickle cell anemia Father   . Sickle cell trait Brother     Prior to Admission medications   Medication Sig Start Date End Date Taking? Authorizing Provider  ergocalciferol (VITAMIN D2) 50000 UNITS capsule Take 50,000 Units by mouth once a week. On Tuesday 04/22/13  Yes Dorena Dew, FNP  folic acid (FOLVITE) 1 MG tablet Take 1 tablet (1 mg total) by mouth every morning. 06/05/12  Yes [name], MD  HYDROmorphone (DILAUDID) 4 MG tablet Take 1 tablet (4 mg total) by mouth every 4 (four) hours as needed for severe pain. 05/23/13  Yes Dorena Dew, FNP  hydroxyurea (HYDREA) 500 MG capsule Take 1,000 mg by mouth daily. May take with food to minimize GI side effects. 03/30/13  Yes Elwyn Reach, MD  morphine (MS CONTIN) 15 MG 12 hr tablet Take 1 tablet (15 mg total) by mouth 2 (two) times daily. 05/23/13  Yes Dorena Dew, FNP  potassium chloride SA (K-DUR,KLOR-CON) 20 MEQ tablet Take 1 tablet (20 mEq total) by mouth every morning. 03/30/13  Yes Elwyn Reach, MD  Rivaroxaban (XARELTO) 20 MG TABS tablet Take 20 mg by mouth daily with supper.   Yes Historical Provider, MD  zolpidem (AMBIEN) 10 MG tablet Take 1 tablet (10 mg total) by mouth at bedtime as needed for sleep. 04/24/13  Yes Dorena Dew, FNP  celecoxib (CELEBREX) 200 MG capsule Take 1 capsule (200 mg total) by mouth 2 (two) times daily. 03/30/13   Elwyn Reach, MD  levofloxacin (LEVAQUIN) 750 MG tablet Take 1 tablet (750 mg total) by mouth daily. 06/05/13   Elwyn Reach, MD   Physical Exam: Filed Vitals:   06/14/13 1200  BP: 110/71  Pulse: 69  Temp: 98.5 F (36.9 C)  TempSrc: Oral  Resp: 18  SpO2: 96%   Constitutional: Appears well-developed and well-nourished. No distress.  HENT: Normocephalic. External right and left ear normal.  Eyes: Conjunctivae and EOM are normal. PERRLA, pronounced bilateral scleral icterus.  Neck: Normal ROM. Neck supple. No JVD. No tracheal deviation.  CVS: tachycardic,  S1/S2 +, no murmurs, no gallops, no carotid bruit.  Pulmonary: Effort and breath sounds normal, no stridor, rhonchi, wheezes, rales.  Chest: right chest wall rib cage pain with palpation, no bruising seen.  Abdominal: Soft. BS +, no distension, tenderness, rebound or guarding.  Musculoskeletal: Normal range of motion. No edema and no tenderness.  Lymphadenopathy: No lymphadenopathy noted, cervical, inguinal. Neuro: Alert. Normal reflexes, muscle tone coordination. No cranial nerve deficit.  Skin: Skin is warm and dry.  Psychiatric: Normal mood and affect.  Labs on Admission:   Basic Metabolic Panel:  Recent Labs Lab 06/14/13 0454  NA 139  K 3.6*  CL 103  CO2 23  GLUCOSE 100*  BUN 4*  CREATININE 0.69  CALCIUM 8.7   Liver Function Tests: No results found for this basename: AST, ALT, ALKPHOS, BILITOT, PROT, ALBUMIN,  in the last 168 hours CBC:  Recent Labs Lab 06/14/13 0454  WBC 16.8*  HGB 7.6*  HCT 22.0*  MCV 96.1  PLT 553*   BNP: No components found with this basename: POCBNP,   Radiological Exams on Admission: No results found.  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  Start hypotonic IVFs. Reviewed labs done this morning in ER.  Treat hypokalemia.  Start  weight-based rapid dilaudid re-dosing.  If pain continues to be 7 or higher after this treatment, then initiate dilaudid PCA.  Hemoglobin stable at 7.6.  No indication for transfusion at this time.  Monitor clinical course and adjust therapy accordingly.  Use adjunctive therapies including K-pad, Toradol for inflammation and encourage incentive spirometry.   Resume hydrea, long-acting MS Contin, Xarelto, folic acid.   Time spend: 75 Code Status: FULL Family  Communication: n/a Disposition Plan: pending clinical course   Murlean Iba, MD  Pager [phone]  If 7PM-7AM, please contact night-coverage [email or web address] Password [facility] 06/14/2013, 12:41 PM

## 2013-06-14 NOTE — ED Provider Notes (Signed)
CSN: [id number]     Arrival date & time 06/14/13  0358 History   First MD Initiated Contact with Patient 06/14/13 0430     Chief Complaint  Patient presents with  . Sickle Cell Pain Crisis     (Consider location/radiation/quality/duration/timing/severity/associated sxs/prior Treatment) HPI HX per PT  - sickle cell PT with sickle cell crisis, typical pains in his back, ribs and legs. Taking meds at home without relief tonight. No cough. No F/C. No ABD pain. No rash. No new or unusual symptoms for him.   Past Medical History  Diagnosis Date  . Sickle cell anemia   . Blood transfusion   . 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 Hypokalemia   . Mood disorder   . History of pulmonary embolus (PE)   . Avascular necrosis   . Leukocytosis     Chronic  . Thrombocytosis     Chronic  . Hypertension   . History of Clostridium difficile infection   . Uses marijuana   . Chronic anticoagulation   . Functional asplenia   . Former smoker   . Second hand tobacco smoke exposure   . Alcohol consumption of one to four drinks per day    Past Surgical History  Procedure Laterality Date  . Right hip replacement      08/2006  . Cholecystectomy      01/2008  . Porta cath placement    . Porta cath removal    . Umbilical hernia repair      01/2008  . Excision of left periauricular cyst      10/2009  . Excision of right ear lobe cyst with primary closur      11/2007  . Portacath placement  01/05/2012    Procedure: INSERTION PORT-A-CATH;  Surgeon: Odis Hollingshead, MD;  Location: [facility];  Service: General;  Laterality: N/A;  ultrasound guiced port a cath insertion with fluoroscopy   Family History  Problem Relation Age of Onset  . Sickle cell anemia Mother   . Sickle cell anemia Father   . Sickle cell trait Brother    History  Substance Use Topics  . Smoking status: Former Smoker -- 13 years    Types: Cigarettes    Quit date: 07/08/2010  . Smokeless tobacco:  Never Used  . Alcohol Use: 3.6 oz/week    6 Shots of liquor per week    Review of Systems  Constitutional: Negative for fever and chills.  Respiratory: Negative for cough and shortness of breath.   Cardiovascular: Negative for palpitations and leg swelling.  Gastrointestinal: Negative for vomiting and abdominal pain.  Genitourinary: Negative for flank pain.  Musculoskeletal: Positive for back pain. Negative for neck pain and neck stiffness.  Skin: Negative for rash.  Neurological: Negative for headaches.  All other systems reviewed and are negative.     Allergies  Morphine and related  Home Medications   Prior to Admission medications   Medication Sig Start Date End Date Taking? Authorizing Provider  celecoxib (CELEBREX) 200 MG capsule Take 1 capsule (200 mg total) by mouth 2 (two) times daily. 03/30/13  Yes Elwyn Reach, MD  ergocalciferol (VITAMIN D2) 50000 UNITS capsule Take 50,000 Units by mouth once a week. On Tuesday 04/22/13  Yes Dorena Dew, FNP  folic acid (FOLVITE) 1 MG tablet Take 1 tablet (1 mg total) by mouth every morning. 06/05/12  Yes [name], MD  HYDROmorphone (DILAUDID) 4 MG tablet Take 1  tablet (4 mg total) by mouth every 4 (four) hours as needed for severe pain. 05/23/13  Yes Dorena Dew, FNP  hydroxyurea (HYDREA) 500 MG capsule Take 1,000 mg by mouth daily. May take with food to minimize GI side effects. 03/30/13  Yes Elwyn Reach, MD  levofloxacin (LEVAQUIN) 750 MG tablet Take 1 tablet (750 mg total) by mouth daily. 06/05/13  Yes Elwyn Reach, MD  potassium chloride SA (K-DUR,KLOR-CON) 20 MEQ tablet Take 1 tablet (20 mEq total) by mouth every morning. 03/30/13  Yes Elwyn Reach, MD  Rivaroxaban (XARELTO) 20 MG TABS tablet Take 20 mg by mouth daily with supper.   Yes Historical Provider, MD  zolpidem (AMBIEN) 10 MG tablet Take 1 tablet (10 mg total) by mouth at bedtime as needed for sleep. 04/24/13  Yes Dorena Dew, FNP   morphine (MS CONTIN) 15 MG 12 hr tablet Take 1 tablet (15 mg total) by mouth 2 (two) times daily. 05/23/13   Dorena Dew, FNP   BP 136/82  Pulse 78  Temp(Src) 97.8 F (36.6 C) (Oral)  Resp 18  Ht 6' (1.829 m)  Wt 165 lb (74.844 kg)  BMI 22.37 kg/m2  SpO2 93% Physical Exam  Constitutional: He is oriented to person, place, and time. He appears well-developed and well-nourished.  HENT:  Head: Normocephalic and atraumatic.  Mouth/Throat: Oropharynx is clear and moist.  Eyes: EOM are normal. Pupils are equal, round, and reactive to light.  Neck: Neck supple.  Cardiovascular: Normal rate, regular rhythm and intact distal pulses.   Pulmonary/Chest: Effort normal and breath sounds normal. No respiratory distress. He exhibits no tenderness.  Abdominal: Soft. There is no tenderness.  Musculoskeletal: Normal range of motion. He exhibits no edema.  Tender over LEs, no rash/ deformity. No LE deficits with gait and sensory to light touch intact  Neurological: He is alert and oriented to person, place, and time. No cranial nerve deficit.  Skin: Skin is warm and dry.    ED Course  Procedures (including critical care time) Labs Review Labs Reviewed  CBC - Abnormal; Notable for the following:    WBC 16.8 (*)    RBC 2.29 (*)    Hemoglobin 7.6 (*)    HCT 22.0 (*)    RDW 24.0 (*)    Platelets 553 (*)    All other components within normal limits  BASIC METABOLIC PANEL - Abnormal; Notable for the following:    Potassium 3.6 (*)    Glucose, Bld 100 (*)    BUN 4 (*)    All other components within normal limits  RETICULOCYTES - Abnormal; Notable for the following:    Retic Ct Pct 14.7 (*)    RBC. 2.29 (*)    Retic Count, Manual 336.6 (*)    All other components within normal limits    IVFs, O2, IV Dilaudiud Recheck improving, requesting repeat Dilaudid 6:21 AM PT requesting one more pain shot and would like to be discharged. He agrees to f/u PCP and take medications as prescribed.   MDM   Final diagnoses:  Sickle cell pain crisis   Labs reviewed Improved with IV narcotics VS and nurses notes reviewed   Teressa Lower, MD 06/14/13 [phone]

## 2013-06-14 NOTE — Discharge Instructions (Signed)
Sickle Cell Anemia, Adult °Sickle cell anemia is a condition in which red blood cells have an abnormal "sickle" shape. This abnormal shape shortens the cells' life span, which results in a lower than normal concentration of red blood cells in the blood. The sickle shape also causes the cells to clump together and block free blood flow through the blood vessels. As a result, the tissues and organs of the body do not receive enough oxygen. Sickle cell anemia causes organ damage and pain and increases the risk of infection. °CAUSES  °Sickle cell anemia is a genetic disorder. Those who receive two copies of the gene have the condition, and those who receive one copy have the trait. °RISK FACTORS °The sickle cell gene is most common in people whose families originated in [place]. Other areas of the globe where sickle cell trait occurs include the Mediterranean, [place] and [place], the [place], and the [place].  °SIGNS AND SYMPTOMS °· Pain, especially in the extremities, back, chest, or abdomen (common). The pain may start suddenly or may develop following an illness, especially if there is dehydration. Pain can also occur due to overexertion or exposure to extreme temperature changes. °· Frequent severe bacterial infections, especially certain types of pneumonia and meningitis. °· Pain and swelling in the hands and feet. °· Decreased activity.   °· Loss of appetite.   °· Change in behavior. °· Headaches. °· Seizures. °· Shortness of breath or difficulty breathing. °· Vision changes. °· Skin ulcers. °Those with the trait may not have symptoms or they may have mild symptoms.  °DIAGNOSIS  °Sickle cell anemia is diagnosed with blood tests that demonstrate the genetic trait. It is often diagnosed during the newborn period, due to mandatory testing nationwide. A variety of blood tests, X-rays, CT scans, MRI scans, ultrasounds, and lung function tests may also be done to monitor the condition. °TREATMENT  °Sickle  cell anemia may be treated with: °· Medicines. You may be given pain medicines, antibiotic medicines (to treat and prevent infections) or medicines to increase the production of certain types of hemoglobin. °· Fluids. °· Oxygen. °· Blood transfusions. °HOME CARE INSTRUCTIONS  °· Drink enough fluid to keep your urine clear or pale yellow. Increase your fluid intake in hot weather and during exercise. °· Do not smoke. Smoking lowers oxygen levels in the blood.   °· Only take over-the-counter or prescription medicines for pain, fever, or discomfort as directed by your health care provider. °· Take antibiotics as directed by your health care provider. Make sure you finish them it even if you start to feel better.   °· Take supplements as directed by your health care provider.   °· Consider wearing a medical alert bracelet. This tells anyone caring for you in an emergency of your condition.   °· When traveling, keep your medical information, health care provider's names, and the medicines you take with you at all times.   °· If you develop a fever, do not take medicines to reduce the fever right away. This could cover up a problem that is developing. Notify your health care provider. °· Keep all follow-up appointments with your health care provider. Sickle cell anemia requires regular medical care. °SEEK MEDICAL CARE IF: ° You have a fever. °SEEK IMMEDIATE MEDICAL CARE IF:  °· You feel dizzy or faint.   °· You have new abdominal pain, especially on the left side near the stomach area.   °· You develop a persistent, often uncomfortable and painful penile erection (priapism). If this is not treated immediately it   will lead to impotence.   °· You have numbness your arms or legs or you have a hard time moving them.   °· You have a hard time with speech.   °· You have a fever or persistent symptoms for more than 2 3 days.   °· You have a fever and your symptoms suddenly get worse.   °· You have signs or symptoms of infection.  These include:   °· Chills.   °· Abnormal tiredness (lethargy).   °· Irritability.   °· Poor eating.   °· Vomiting.   °· You develop pain that is not helped with medicine.   °· You develop shortness of breath. °· You have pain in your chest.   °· You are coughing up pus-like or bloody sputum.   °· You develop a stiff neck. °· Your feet or hands swell or have pain. °· Your abdomen appears bloated. °· You develop joint pain. °MAKE SURE YOU: °· Understand these instructions. °· Will watch your child's condition. °· Will get help right away if your child is not doing well or gets worse. °Document Released: 04/27/2005 Document Revised: 11/07/2012 Document Reviewed: 08/29/2012 °[employer]® Patient Information ©2014 [employer]. ° °

## 2013-06-15 NOTE — Discharge Summary (Signed)
The patient was re-assessed and he is now reporting that his pain is 5/10 and he feels that he will be able to manage his chronic pain at home with his oral pain medications.  He has close outpatient follow up with Dr. Zigmund Daniel scheduled.  He reports that his breathing is stable.  He has an outpatient appointment with a pulmonologist scheduled. The pain in his right side of his rib cage he is reporting is manageable. The patient says that he is not having severe pruritus.  He denies nausea or vomiting.  He denies diarrhea.  He was given extra potassium 40 meq to treat his low K and advised to resume home daily potassium supplement.  He is to address his low vitamin D with Dr. Zigmund Daniel in a few days for his regularly scheduled appointment.   He was again counseled to avoid alcohol and tobacco.  He verbalized understanding.  I agree with assessment and plan as detailed by Cammie Sickle, NP.  The patient was given instructions to seek medical care or RTC if symptoms worsened or new problems developed. The patient verbalized understanding.    Gerlene Fee, MD, CDE, [place], [place]

## 2013-06-16 ENCOUNTER — Encounter (HOSPITAL_COMMUNITY): Payer: Self-pay | Admitting: Emergency Medicine

## 2013-06-16 ENCOUNTER — Emergency Department (HOSPITAL_COMMUNITY)
Admission: EM | Admit: 2013-06-16 | Discharge: 2013-06-16 | Disposition: A | Discharge status: Home / Self Care | Payer: Medicare Other | Attending: Emergency Medicine | Admitting: Emergency Medicine

## 2013-06-16 DIAGNOSIS — Z79899 Other long term (current) drug therapy: Secondary | ICD-10-CM | POA: Insufficient documentation

## 2013-06-16 DIAGNOSIS — Z8659 Personal history of other mental and behavioral disorders: Secondary | ICD-10-CM | POA: Insufficient documentation

## 2013-06-16 DIAGNOSIS — Z8619 Personal history of other infectious and parasitic diseases: Secondary | ICD-10-CM | POA: Insufficient documentation

## 2013-06-16 DIAGNOSIS — Z87891 Personal history of nicotine dependence: Secondary | ICD-10-CM | POA: Insufficient documentation

## 2013-06-16 DIAGNOSIS — Z86718 Personal history of other venous thrombosis and embolism: Secondary | ICD-10-CM | POA: Insufficient documentation

## 2013-06-16 DIAGNOSIS — Z791 Long term (current) use of non-steroidal anti-inflammatories (NSAID): Secondary | ICD-10-CM | POA: Insufficient documentation

## 2013-06-16 DIAGNOSIS — Z8739 Personal history of other diseases of the musculoskeletal system and connective tissue: Secondary | ICD-10-CM | POA: Insufficient documentation

## 2013-06-16 DIAGNOSIS — I10 Essential (primary) hypertension: Secondary | ICD-10-CM | POA: Diagnosis not present

## 2013-06-16 DIAGNOSIS — Z86711 Personal history of pulmonary embolism: Secondary | ICD-10-CM | POA: Insufficient documentation

## 2013-06-16 DIAGNOSIS — Z862 Personal history of diseases of the blood and blood-forming organs and certain disorders involving the immune mechanism: Secondary | ICD-10-CM | POA: Insufficient documentation

## 2013-06-16 DIAGNOSIS — Z8639 Personal history of other endocrine, nutritional and metabolic disease: Secondary | ICD-10-CM | POA: Insufficient documentation

## 2013-06-16 DIAGNOSIS — Z7901 Long term (current) use of anticoagulants: Secondary | ICD-10-CM | POA: Insufficient documentation

## 2013-06-16 DIAGNOSIS — D5700 Hb-SS disease with crisis, unspecified: Secondary | ICD-10-CM | POA: Insufficient documentation

## 2013-06-16 DIAGNOSIS — Z8776 Personal history of (corrected) congenital malformations of integument, limbs and musculoskeletal system: Secondary | ICD-10-CM | POA: Insufficient documentation

## 2013-06-16 DIAGNOSIS — Z87768 Personal history of other specified (corrected) congenital malformations of integument, limbs and musculoskeletal system: Secondary | ICD-10-CM | POA: Insufficient documentation

## 2013-06-16 LAB — COMPREHENSIVE METABOLIC PANEL
ALT: 29 U/L (ref 0–53)
AST: 45 U/L — ABNORMAL HIGH (ref 0–37)
Albumin: 3.9 g/dL (ref 3.5–5.2)
Alkaline Phosphatase: 98 U/L (ref 39–117)
BILIRUBIN TOTAL: 5.9 mg/dL — AB (ref 0.3–1.2)
BUN: 6 mg/dL (ref 6–23)
CHLORIDE: 106 meq/L (ref 96–112)
CO2: 25 meq/L (ref 19–32)
Calcium: 8.9 mg/dL (ref 8.4–10.5)
Creatinine, Ser: 0.55 mg/dL (ref 0.50–1.35)
GFR calc non Af Amer: >90 mL/min (ref >90)
Glucose, Bld: 97 mg/dL (ref 70–99)
Potassium: 4.2 mEq/L (ref 3.7–5.3)
Sodium: 140 mEq/L (ref 137–147)
Total Protein: 7.2 g/dL (ref 6.0–8.3)

## 2013-06-16 LAB — CBC WITH DIFFERENTIAL/PLATELET
BASOS ABS: 0.2 10*3/uL — AB (ref 0.0–0.1)
Basophils Relative: 1 % (ref 0–1)
EOS PCT: 2 % (ref 0–5)
Eosinophils Absolute: 0.5 10*3/uL (ref 0.0–0.7)
HEMATOCRIT: 20.5 % — AB (ref 39.0–52.0)
Hemoglobin: 6.8 g/dL — CL (ref 13.0–17.0)
LYMPHS ABS: 3.5 10*3/uL (ref 0.7–4.0)
Lymphocytes Relative: 15 % (ref 12–46)
MCH: 31.8 pg (ref 26.0–34.0)
MCHC: 33.2 g/dL (ref 30.0–36.0)
MCV: 95.8 fL (ref 78.0–100.0)
Monocytes Absolute: 2.4 10*3/uL — ABNORMAL HIGH (ref 0.1–1.0)
Monocytes Relative: 10 % (ref 3–12)
NEUTROS ABS: 16.9 10*3/uL — AB (ref 1.7–7.7)
Neutrophils Relative %: 72 % (ref 43–77)
Platelets: 542 10*3/uL — ABNORMAL HIGH (ref 150–400)
RBC: 2.14 MIL/uL — ABNORMAL LOW (ref 4.22–5.81)
RDW: 22.7 % — AB (ref 11.5–15.5)
WBC: 23.5 10*3/uL — ABNORMAL HIGH (ref 4.0–10.5)

## 2013-06-16 LAB — RETICULOCYTES
RBC.: 2.14 MIL/uL — AB (ref 4.22–5.81)
RETIC CT PCT: 18.7 % — AB (ref 0.4–3.1)
Retic Count, Absolute: 400.2 10*3/uL — ABNORMAL HIGH (ref 19.0–186.0)

## 2013-06-16 MED ORDER — PROMETHAZINE HCL 25 MG/ML IJ SOLN
25.0000 mg | Freq: Once | INTRAMUSCULAR | Status: AC
  Administered 2013-06-16: 25 mg via INTRAVENOUS
  Filled 2013-06-16: qty 1

## 2013-06-16 MED ORDER — DIPHENHYDRAMINE HCL 50 MG/ML IJ SOLN
25.0000 mg | Freq: Once | INTRAMUSCULAR | Status: AC
  Administered 2013-06-16: 25 mg via INTRAVENOUS
  Filled 2013-06-16: qty 1

## 2013-06-16 MED ORDER — HYDROMORPHONE HCL 2 MG PO TABS
4.0000 mg | ORAL_TABLET | Freq: Once | ORAL | Status: AC
  Administered 2013-06-16: 4 mg via ORAL
  Filled 2013-06-16: qty 2

## 2013-06-16 MED ORDER — HYDROMORPHONE HCL PF 2 MG/ML IJ SOLN
2.0000 mg | Freq: Once | INTRAMUSCULAR | Status: AC
  Administered 2013-06-16: 2 mg via INTRAVENOUS
  Filled 2013-06-16: qty 1

## 2013-06-16 MED ORDER — HEPARIN SOD (PORK) LOCK FLUSH 100 UNIT/ML IV SOLN
500.0000 [IU] | Freq: Once | INTRAVENOUS | Status: AC
  Administered 2013-06-16: 500 [IU]
  Filled 2013-06-16: qty 5

## 2013-06-16 MED ORDER — ONDANSETRON HCL 4 MG/2ML IJ SOLN
4.0000 mg | Freq: Once | INTRAMUSCULAR | Status: AC
  Administered 2013-06-16: 4 mg via INTRAVENOUS
  Filled 2013-06-16: qty 2

## 2013-06-16 NOTE — ED Notes (Signed)
Patient states that he woke up this am with Sickle cell pain.

## 2013-06-16 NOTE — Discharge Instructions (Signed)
Sickle Cell Anemia, Adult °Sickle cell anemia is a condition in which red blood cells have an abnormal "sickle" shape. This abnormal shape shortens the cells' life span, which results in a lower than normal concentration of red blood cells in the blood. The sickle shape also causes the cells to clump together and block free blood flow through the blood vessels. As a result, the tissues and organs of the body do not receive enough oxygen. Sickle cell anemia causes organ damage and pain and increases the risk of infection. °CAUSES  °Sickle cell anemia is a genetic disorder. Those who receive two copies of the gene have the condition, and those who receive one copy have the trait. °RISK FACTORS °The sickle cell gene is most common in people whose families originated in [place]. Other areas of the globe where sickle cell trait occurs include the Mediterranean, [place] and [place], the [place], and the [place].  °SIGNS AND SYMPTOMS °· Pain, especially in the extremities, back, chest, or abdomen (common). The pain may start suddenly or may develop following an illness, especially if there is dehydration. Pain can also occur due to overexertion or exposure to extreme temperature changes. °· Frequent severe bacterial infections, especially certain types of pneumonia and meningitis. °· Pain and swelling in the hands and feet. °· Decreased activity.   °· Loss of appetite.   °· Change in behavior. °· Headaches. °· Seizures. °· Shortness of breath or difficulty breathing. °· Vision changes. °· Skin ulcers. °Those with the trait may not have symptoms or they may have mild symptoms.  °DIAGNOSIS  °Sickle cell anemia is diagnosed with blood tests that demonstrate the genetic trait. It is often diagnosed during the newborn period, due to mandatory testing nationwide. A variety of blood tests, X-rays, CT scans, MRI scans, ultrasounds, and lung function tests may also be done to monitor the condition. °TREATMENT  °Sickle  cell anemia may be treated with: °· Medicines. You may be given pain medicines, antibiotic medicines (to treat and prevent infections) or medicines to increase the production of certain types of hemoglobin. °· Fluids. °· Oxygen. °· Blood transfusions. °HOME CARE INSTRUCTIONS  °· Drink enough fluid to keep your urine clear or pale yellow. Increase your fluid intake in hot weather and during exercise. °· Do not smoke. Smoking lowers oxygen levels in the blood.   °· Only take over-the-counter or prescription medicines for pain, fever, or discomfort as directed by your health care provider. °· Take antibiotics as directed by your health care provider. Make sure you finish them it even if you start to feel better.   °· Take supplements as directed by your health care provider.   °· Consider wearing a medical alert bracelet. This tells anyone caring for you in an emergency of your condition.   °· When traveling, keep your medical information, health care provider's names, and the medicines you take with you at all times.   °· If you develop a fever, do not take medicines to reduce the fever right away. This could cover up a problem that is developing. Notify your health care provider. °· Keep all follow-up appointments with your health care provider. Sickle cell anemia requires regular medical care. °SEEK MEDICAL CARE IF: ° You have a fever. °SEEK IMMEDIATE MEDICAL CARE IF:  °· You feel dizzy or faint.   °· You have new abdominal pain, especially on the left side near the stomach area.   °· You develop a persistent, often uncomfortable and painful penile erection (priapism). If this is not treated immediately it   will lead to impotence.   °· You have numbness your arms or legs or you have a hard time moving them.   °· You have a hard time with speech.   °· You have a fever or persistent symptoms for more than 2 3 days.   °· You have a fever and your symptoms suddenly get worse.   °· You have signs or symptoms of infection.  These include:   °· Chills.   °· Abnormal tiredness (lethargy).   °· Irritability.   °· Poor eating.   °· Vomiting.   °· You develop pain that is not helped with medicine.   °· You develop shortness of breath. °· You have pain in your chest.   °· You are coughing up pus-like or bloody sputum.   °· You develop a stiff neck. °· Your feet or hands swell or have pain. °· Your abdomen appears bloated. °· You develop joint pain. °MAKE SURE YOU: °· Understand these instructions. °· Will watch your child's condition. °· Will get help right away if your child is not doing well or gets worse. °Document Released: 04/27/2005 Document Revised: 11/07/2012 Document Reviewed: 08/29/2012 °[employer]® Patient Information ©2014 [employer]. ° °

## 2013-06-16 NOTE — ED Provider Notes (Signed)
CSN: [id number]     Arrival date & time 06/16/13  1106 History   First MD Initiated Contact with Patient 06/16/13 1149     Chief Complaint  Patient presents with  . Sickle Cell Pain Crisis     (Consider location/radiation/quality/duration/timing/severity/associated sxs/prior Treatment) HPI Comments: Patient is a 33 year old male with history of sickle cell disease, blood transfusion, PE on chronic anticoagulation, hypokalemia, mood disorder, avascular necrosis, chronic leukocytosis, chronic thrombocytosis, hypertension who presents today for sickle cell pain. He reports the pain has been ongoing since Friday. He initially went to the sickle cell clinic and felt improved, but the pain returned yesterday. The pain is in his bilateral flank and he has a headache. This is normal for his sickle cell pain. He has taken his PO dilaudid and MS Contin without relief of his symptoms. He denies chest pain, shortness of breath, lightheadedness, dizziness, fever.   The history is provided by the patient. No language interpreter was used.    Past Medical History  Diagnosis Date  . Sickle cell anemia   . Blood transfusion   . 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 Hypokalemia   . Mood disorder   . History of pulmonary embolus (PE)   . Avascular necrosis   . Leukocytosis     Chronic  . Thrombocytosis     Chronic  . Hypertension   . History of Clostridium difficile infection   . Uses marijuana   . Chronic anticoagulation   . Functional asplenia   . Former smoker   . Second hand tobacco smoke exposure   . Alcohol consumption of one to four drinks per day    Past Surgical History  Procedure Laterality Date  . Right hip replacement      08/2006  . Cholecystectomy      01/2008  . Porta cath placement    . Porta cath removal    . Umbilical hernia repair      01/2008  . Excision of left periauricular cyst      10/2009  . Excision of right ear lobe cyst with primary  closur      11/2007  . Portacath placement  01/05/2012    Procedure: INSERTION PORT-A-CATH;  Surgeon: Odis Hollingshead, MD;  Location: [facility];  Service: General;  Laterality: N/A;  ultrasound guiced port a cath insertion with fluoroscopy   Family History  Problem Relation Age of Onset  . Sickle cell anemia Mother   . Sickle cell anemia Father   . Sickle cell trait Brother    History  Substance Use Topics  . Smoking status: Former Smoker -- 13 years    Types: Cigarettes    Quit date: 07/08/2010  . Smokeless tobacco: Never Used  . Alcohol Use: 3.6 oz/week    6 Shots of liquor per week    Review of Systems  Constitutional: Negative for fever and chills.  Respiratory: Negative for shortness of breath.   Cardiovascular: Negative for chest pain.  Gastrointestinal: Negative for nausea, vomiting and abdominal pain.  Genitourinary: Positive for flank pain.  Neurological: Positive for headaches. Negative for dizziness and light-headedness.  All other systems reviewed and are negative.     Allergies  Morphine and related  Home Medications   Prior to Admission medications   Medication Sig Start Date End Date Taking? Authorizing Provider  celecoxib (CELEBREX) 200 MG capsule Take 1 capsule (200 mg total) by mouth 2 (two) times daily. 03/30/13  Yes Elwyn Reach, MD  ergocalciferol (VITAMIN D2) 50000 UNITS capsule Take 50,000 Units by mouth once a week. On Tuesday 04/22/13  Yes Dorena Dew, FNP  folic acid (FOLVITE) 1 MG tablet Take 1 tablet (1 mg total) by mouth every morning. 06/05/12  Yes [name], MD  HYDROmorphone (DILAUDID) 4 MG tablet Take 1 tablet (4 mg total) by mouth every 4 (four) hours as needed for severe pain. 05/23/13  Yes Dorena Dew, FNP  hydroxyurea (HYDREA) 500 MG capsule Take 1,000 mg by mouth daily. May take with food to minimize GI side effects. 03/30/13  Yes Elwyn Reach, MD  morphine (MS CONTIN) 15 MG 12 hr tablet Take 1 tablet (15 mg total)  by mouth 2 (two) times daily. 05/23/13  Yes Dorena Dew, FNP  potassium chloride SA (K-DUR,KLOR-CON) 20 MEQ tablet Take 1 tablet (20 mEq total) by mouth every morning. 03/30/13  Yes Elwyn Reach, MD  Rivaroxaban (XARELTO) 20 MG TABS tablet Take 20 mg by mouth daily with supper.   Yes Historical Provider, MD  zolpidem (AMBIEN) 10 MG tablet Take 1 tablet (10 mg total) by mouth at bedtime as needed for sleep. 04/24/13  Yes Dorena Dew, FNP   BP 124/80  Pulse 95  Temp(Src) 98.3 F (36.8 C) (Oral)  Resp 20  SpO2 99% Physical Exam  Nursing note and vitals reviewed. Constitutional: He is oriented to person, place, and time. He appears well-developed and well-nourished. No distress.  Patient is in no acute distress. Laying comfortably on bed.  HENT:  Head: Normocephalic and atraumatic.  Right Ear: External ear normal.  Left Ear: External ear normal.  Nose: Nose normal.  Eyes: Conjunctivae and EOM are normal. Pupils are equal, round, and reactive to light.  Neck: Normal range of motion. No tracheal deviation present.  No nuchal rigidity or meningeal signs  Cardiovascular: Normal rate, regular rhythm, normal heart sounds, intact distal pulses and normal pulses.   Pulses:      Radial pulses are 2+ on the right side, and 2+ on the left side.       Posterior tibial pulses are 2+ on the right side, and 2+ on the left side.  Pulmonary/Chest: Effort normal and breath sounds normal. No stridor.  Abdominal: Soft. He exhibits no distension. There is no tenderness.  Musculoskeletal: Normal range of motion.  Neurological: He is alert and oriented to person, place, and time. He has normal strength. Coordination and gait normal. GCS eye subscore is 4. GCS verbal subscore is 5. GCS motor subscore is 6.  Finger nose finger normal. No facial droop.   Skin: Skin is warm and dry. He is not diaphoretic.  Psychiatric: He has a normal mood and affect. His behavior is normal.    ED Course  Procedures  (including critical care time) Labs Review Labs Reviewed  CBC WITH DIFFERENTIAL - Abnormal; Notable for the following:    WBC 23.5 (*)    RBC 2.14 (*)    Hemoglobin 6.8 (*)    HCT 20.5 (*)    RDW 22.7 (*)    Platelets 542 (*)    Neutro Abs 16.9 (*)    Monocytes Absolute 2.4 (*)    Basophils Absolute 0.2 (*)    All other components within normal limits  COMPREHENSIVE METABOLIC PANEL - Abnormal; Notable for the following:    AST 45 (*)    Total Bilirubin 5.9 (*)    All other components within normal limits  RETICULOCYTES - Abnormal; Notable for the following:    Retic Ct Pct 18.7 (*)    RBC. 2.14 (*)    Retic Count, Manual 400.2 (*)    All other components within normal limits    Imaging Review No results found.   EKG Interpretation None      MDM   Final diagnoses:  Sickle cell pain crisis   Patient presents to ED for sickle cell pain. Pain is normal for his sickle cell crisis. Labs appear to be at patient's baseline. Patient denies any chest pain, shortness of breath, fever. Patient began to feel improved after 4mg  of IV dilaudid. Patient found to be rolling what appears to be marijuana in his bed. He feels comfortable being discharged home. He can follow up with sickle cell center if needed. Discussed return instructions. Vitals signs stable for discharge. Discussed case with Dr. Leonides Schanz who agrees with plan. Pt stable in ED with no significant deterioration in condition.   Elwyn Lade, PA-C 06/17/13 [address] [place] 06/17/13 1434

## 2013-06-17 ENCOUNTER — Encounter (HOSPITAL_COMMUNITY): Payer: Self-pay | Admitting: Emergency Medicine

## 2013-06-17 ENCOUNTER — Emergency Department (HOSPITAL_COMMUNITY): Payer: Medicare Other

## 2013-06-17 ENCOUNTER — Non-Acute Institutional Stay (HOSPITAL_COMMUNITY): Payer: Medicare Other

## 2013-06-17 ENCOUNTER — Inpatient Hospital Stay (HOSPITAL_COMMUNITY)
Admission: EM | Admit: 2013-06-17 | Discharge: 2013-06-20 | DRG: 812 | Disposition: A | Discharge status: Home / Self Care | Payer: Medicare Other | Attending: Internal Medicine | Admitting: Internal Medicine

## 2013-06-17 DIAGNOSIS — F1210 Cannabis abuse, uncomplicated: Secondary | ICD-10-CM | POA: Diagnosis present

## 2013-06-17 DIAGNOSIS — Z7901 Long term (current) use of anticoagulants: Secondary | ICD-10-CM

## 2013-06-17 DIAGNOSIS — F919 Conduct disorder, unspecified: Secondary | ICD-10-CM | POA: Diagnosis not present

## 2013-06-17 DIAGNOSIS — J984 Other disorders of lung: Secondary | ICD-10-CM | POA: Diagnosis not present

## 2013-06-17 DIAGNOSIS — R17 Unspecified jaundice: Secondary | ICD-10-CM | POA: Diagnosis present

## 2013-06-17 DIAGNOSIS — D5701 Hb-SS disease with acute chest syndrome: Secondary | ICD-10-CM | POA: Diagnosis present

## 2013-06-17 DIAGNOSIS — I214 Non-ST elevation (NSTEMI) myocardial infarction: Secondary | ICD-10-CM

## 2013-06-17 DIAGNOSIS — Z96649 Presence of unspecified artificial hip joint: Secondary | ICD-10-CM

## 2013-06-17 DIAGNOSIS — E876 Hypokalemia: Secondary | ICD-10-CM | POA: Diagnosis not present

## 2013-06-17 DIAGNOSIS — Z86718 Personal history of other venous thrombosis and embolism: Secondary | ICD-10-CM

## 2013-06-17 DIAGNOSIS — F411 Generalized anxiety disorder: Secondary | ICD-10-CM | POA: Diagnosis not present

## 2013-06-17 DIAGNOSIS — D473 Essential (hemorrhagic) thrombocythemia: Secondary | ICD-10-CM | POA: Diagnosis present

## 2013-06-17 DIAGNOSIS — Z79899 Other long term (current) drug therapy: Secondary | ICD-10-CM | POA: Diagnosis not present

## 2013-06-17 DIAGNOSIS — I2789 Other specified pulmonary heart diseases: Secondary | ICD-10-CM | POA: Diagnosis present

## 2013-06-17 DIAGNOSIS — Z87891 Personal history of nicotine dependence: Secondary | ICD-10-CM | POA: Diagnosis not present

## 2013-06-17 DIAGNOSIS — Z86711 Personal history of pulmonary embolism: Secondary | ICD-10-CM

## 2013-06-17 DIAGNOSIS — R748 Abnormal levels of other serum enzymes: Secondary | ICD-10-CM | POA: Diagnosis present

## 2013-06-17 DIAGNOSIS — D72829 Elevated white blood cell count, unspecified: Secondary | ICD-10-CM | POA: Diagnosis present

## 2013-06-17 DIAGNOSIS — E559 Vitamin D deficiency, unspecified: Secondary | ICD-10-CM | POA: Diagnosis present

## 2013-06-17 DIAGNOSIS — R109 Unspecified abdominal pain: Secondary | ICD-10-CM | POA: Diagnosis present

## 2013-06-17 DIAGNOSIS — Z832 Family history of diseases of the blood and blood-forming organs and certain disorders involving the immune mechanism: Secondary | ICD-10-CM

## 2013-06-17 DIAGNOSIS — I10 Essential (primary) hypertension: Secondary | ICD-10-CM | POA: Diagnosis present

## 2013-06-17 DIAGNOSIS — R0902 Hypoxemia: Secondary | ICD-10-CM | POA: Diagnosis present

## 2013-06-17 DIAGNOSIS — I252 Old myocardial infarction: Secondary | ICD-10-CM | POA: Diagnosis not present

## 2013-06-17 DIAGNOSIS — D571 Sickle-cell disease without crisis: Secondary | ICD-10-CM | POA: Diagnosis not present

## 2013-06-17 DIAGNOSIS — Z9119 Patient's noncompliance with other medical treatment and regimen: Secondary | ICD-10-CM

## 2013-06-17 DIAGNOSIS — I272 Pulmonary hypertension, unspecified: Secondary | ICD-10-CM

## 2013-06-17 DIAGNOSIS — J189 Pneumonia, unspecified organism: Secondary | ICD-10-CM | POA: Diagnosis not present

## 2013-06-17 DIAGNOSIS — I270 Primary pulmonary hypertension: Secondary | ICD-10-CM | POA: Diagnosis not present

## 2013-06-17 DIAGNOSIS — F1010 Alcohol abuse, uncomplicated: Secondary | ICD-10-CM | POA: Diagnosis present

## 2013-06-17 DIAGNOSIS — Z91199 Patient's noncompliance with other medical treatment and regimen due to unspecified reason: Secondary | ICD-10-CM

## 2013-06-17 DIAGNOSIS — D5700 Hb-SS disease with crisis, unspecified: Secondary | ICD-10-CM | POA: Diagnosis not present

## 2013-06-17 DIAGNOSIS — J90 Pleural effusion, not elsewhere classified: Secondary | ICD-10-CM | POA: Diagnosis not present

## 2013-06-17 DIAGNOSIS — F1290 Cannabis use, unspecified, uncomplicated: Secondary | ICD-10-CM

## 2013-06-17 DIAGNOSIS — I248 Other forms of acute ischemic heart disease: Secondary | ICD-10-CM

## 2013-06-17 LAB — CBC WITH DIFFERENTIAL/PLATELET
Basophils Absolute: 0.2 K/uL — ABNORMAL HIGH (ref 0.0–0.1)
Basophils Relative: 1 % (ref 0–1)
Eosinophils Absolute: 0.6 K/uL (ref 0.0–0.7)
Eosinophils Relative: 3 % (ref 0–5)
HCT: 21.7 % — ABNORMAL LOW (ref 39.0–52.0)
Hemoglobin: 7.4 g/dL — ABNORMAL LOW (ref 13.0–17.0)
Lymphocytes Relative: 11 % — ABNORMAL LOW (ref 12–46)
Lymphs Abs: 2.3 K/uL (ref 0.7–4.0)
MCH: 32.6 pg (ref 26.0–34.0)
MCHC: 34.1 g/dL (ref 30.0–36.0)
MCV: 95.6 fL (ref 78.0–100.0)
Monocytes Absolute: 2.1 K/uL — ABNORMAL HIGH (ref 0.1–1.0)
Monocytes Relative: 10 % (ref 3–12)
Neutro Abs: 15.9 K/uL — ABNORMAL HIGH (ref 1.7–7.7)
Neutrophils Relative %: 75 % (ref 43–77)
Platelets: 480 K/uL — ABNORMAL HIGH (ref 150–400)
RBC: 2.27 MIL/uL — ABNORMAL LOW (ref 4.22–5.81)
RDW: 22.1 % — ABNORMAL HIGH (ref 11.5–15.5)
WBC: 21.1 K/uL — ABNORMAL HIGH (ref 4.0–10.5)

## 2013-06-17 LAB — COMPREHENSIVE METABOLIC PANEL
ALT: 26 U/L (ref 0–53)
AST: 42 U/L — AB (ref 0–37)
Albumin: 3.6 g/dL (ref 3.5–5.2)
Alkaline Phosphatase: 103 U/L (ref 39–117)
BILIRUBIN TOTAL: 5.4 mg/dL — AB (ref 0.3–1.2)
BUN: 6 mg/dL (ref 6–23)
CHLORIDE: 106 meq/L (ref 96–112)
CO2: 23 meq/L (ref 19–32)
Calcium: 8.9 mg/dL (ref 8.4–10.5)
Creatinine, Ser: 0.6 mg/dL (ref 0.50–1.35)
GFR calc Af Amer: >90 mL/min (ref >90)
Glucose, Bld: 96 mg/dL (ref 70–99)
Potassium: 3.5 mEq/L — ABNORMAL LOW (ref 3.7–5.3)
Sodium: 140 mEq/L (ref 137–147)
Total Protein: 6.7 g/dL (ref 6.0–8.3)

## 2013-06-17 LAB — MRSA PCR SCREENING: MRSA BY PCR: NEGATIVE

## 2013-06-17 LAB — URINALYSIS, ROUTINE W REFLEX MICROSCOPIC
Bilirubin Urine: NEGATIVE
Glucose, UA: NEGATIVE mg/dL
Hgb urine dipstick: NEGATIVE
Ketones, ur: NEGATIVE mg/dL
Leukocytes, UA: NEGATIVE
NITRITE: NEGATIVE
Protein, ur: NEGATIVE mg/dL
SPECIFIC GRAVITY, URINE: 1.011 (ref 1.005–1.030)
Urobilinogen, UA: 1 mg/dL (ref 0.0–1.0)
pH: 7 (ref 5.0–8.0)

## 2013-06-17 LAB — BLOOD GAS, ARTERIAL
Acid-base deficit: 1.4 mmol/L (ref 0.0–2.0)
BICARBONATE: 23.3 meq/L (ref 20.0–24.0)
Drawn by: 295031
FIO2: 0.21 %
O2 SAT: 69.5 %
PATIENT TEMPERATURE: 98.6
PH ART: 7.367 (ref 7.350–7.450)
PO2 ART: 45.6 mmHg — AB (ref 80.0–100.0)
TCO2: 22.6 mmol/L (ref 0–100)
pCO2 arterial: 41.5 mmHg (ref 35.0–45.0)

## 2013-06-17 LAB — TROPONIN I: Troponin I: 1.61 ng/mL (ref <0.30)

## 2013-06-17 LAB — HIV ANTIBODY (ROUTINE TESTING W REFLEX): HIV 1&2 Ab, 4th Generation: NONREACTIVE

## 2013-06-17 LAB — RETICULOCYTES
RBC.: 2.27 MIL/uL — ABNORMAL LOW (ref 4.22–5.81)
Retic Count, Absolute: 422.2 K/uL — ABNORMAL HIGH (ref 19.0–186.0)
Retic Ct Pct: 18.6 % — ABNORMAL HIGH (ref 0.4–3.1)

## 2013-06-17 LAB — LACTATE DEHYDROGENASE: LDH: 434 U/L — ABNORMAL HIGH (ref 94–250)

## 2013-06-17 LAB — MAGNESIUM: MAGNESIUM: 1.5 mg/dL (ref 1.5–2.5)

## 2013-06-17 LAB — PREPARE RBC (CROSSMATCH)

## 2013-06-17 MED ORDER — NALOXONE HCL 0.4 MG/ML IJ SOLN: 0.4000 mg | INTRAMUSCULAR | Status: DC | PRN

## 2013-06-17 MED ORDER — HYDROXYUREA 500 MG PO CAPS
1000.0000 mg | ORAL_CAPSULE | Freq: Every day | ORAL | Status: DC
  Administered 2013-06-17 – 2013-06-20 (×4): 1000 mg via ORAL
  Filled 2013-06-17 (×5): qty 2

## 2013-06-17 MED ORDER — DIPHENHYDRAMINE HCL 12.5 MG/5ML PO ELIX
12.5000 mg | ORAL_SOLUTION | Freq: Four times a day (QID) | ORAL | Status: DC | PRN
  Administered 2013-06-18 (×3): 12.5 mg via ORAL
  Filled 2013-06-17 (×3): qty 5

## 2013-06-17 MED ORDER — VITAMIN D (ERGOCALCIFEROL) 1.25 MG (50000 UNIT) PO CAPS
50000.0000 [IU] | ORAL_CAPSULE | ORAL | Status: AC
  Administered 2013-06-17: 50000 [IU] via ORAL
  Filled 2013-06-17: qty 1

## 2013-06-17 MED ORDER — FOLIC ACID 1 MG PO TABS
1.0000 mg | ORAL_TABLET | Freq: Every morning | ORAL | Status: DC
  Administered 2013-06-17 – 2013-06-20 (×4): 1 mg via ORAL
  Filled 2013-06-17 (×4): qty 1

## 2013-06-17 MED ORDER — MORPHINE SULFATE ER 15 MG PO TBCR
15.0000 mg | EXTENDED_RELEASE_TABLET | Freq: Two times a day (BID) | ORAL | Status: DC
  Administered 2013-06-17 – 2013-06-20 (×7): 15 mg via ORAL
  Filled 2013-06-17 (×7): qty 1

## 2013-06-17 MED ORDER — DEXTROSE 5 % IV SOLN
1.0000 g | Freq: Three times a day (TID) | INTRAVENOUS | Status: DC
  Administered 2013-06-18 – 2013-06-19 (×4): 1 g via INTRAVENOUS
  Filled 2013-06-17 (×5): qty 1

## 2013-06-17 MED ORDER — POTASSIUM CHLORIDE CRYS ER 20 MEQ PO TBCR
20.0000 meq | EXTENDED_RELEASE_TABLET | Freq: Every morning | ORAL | Status: DC
  Administered 2013-06-17 – 2013-06-18 (×2): 20 meq via ORAL
  Filled 2013-06-17 (×2): qty 1

## 2013-06-17 MED ORDER — VANCOMYCIN HCL 10 G IV SOLR
1250.0000 mg | Freq: Three times a day (TID) | INTRAVENOUS | Status: DC
  Administered 2013-06-18 – 2013-06-19 (×4): 1250 mg via INTRAVENOUS
  Filled 2013-06-17 (×5): qty 1250

## 2013-06-17 MED ORDER — DEXTROSE 5 % IV SOLN
2.0000 g | Freq: Once | INTRAVENOUS | Status: AC
  Administered 2013-06-17: 2 g via INTRAVENOUS
  Filled 2013-06-17: qty 2

## 2013-06-17 MED ORDER — RIVAROXABAN 20 MG PO TABS
20.0000 mg | ORAL_TABLET | Freq: Every day | ORAL | Status: DC
  Administered 2013-06-17: 20 mg via ORAL
  Filled 2013-06-17: qty 1

## 2013-06-17 MED ORDER — HYDROMORPHONE HCL PF 2 MG/ML IJ SOLN: 2.0000 mg | Freq: Once | INTRAMUSCULAR | Status: DC

## 2013-06-17 MED ORDER — SENNOSIDES-DOCUSATE SODIUM 8.6-50 MG PO TABS: 1.0000 | ORAL_TABLET | Freq: Two times a day (BID) | ORAL | Status: DC | PRN

## 2013-06-17 MED ORDER — ONDANSETRON HCL 4 MG PO TABS: 4.0000 mg | ORAL_TABLET | ORAL | Status: DC | PRN

## 2013-06-17 MED ORDER — VANCOMYCIN HCL 10 G IV SOLR
2000.0000 mg | Freq: Once | INTRAVENOUS | Status: AC
  Administered 2013-06-17: 2000 mg via INTRAVENOUS
  Filled 2013-06-17: qty 2000

## 2013-06-17 MED ORDER — SODIUM CHLORIDE 0.9 % IJ SOLN
10.0000 mL | INTRAMUSCULAR | Status: AC | PRN
  Administered 2013-06-20: 10 mL

## 2013-06-17 MED ORDER — HYDROMORPHONE HCL PF 2 MG/ML IJ SOLN
4.0000 mg | Freq: Once | INTRAMUSCULAR | Status: AC
  Administered 2013-06-17: 4 mg via INTRAVENOUS
  Filled 2013-06-17: qty 2

## 2013-06-17 MED ORDER — HYDROMORPHONE 0.3 MG/ML IV SOLN
INTRAVENOUS | Status: DC
  Administered 2013-06-17: 13:00:00 via INTRAVENOUS
  Administered 2013-06-17: 4.18 mg via INTRAVENOUS
  Administered 2013-06-17: 7.73 mg via INTRAVENOUS
  Administered 2013-06-17: 7.01 mg via INTRAVENOUS
  Administered 2013-06-17 (×2): via INTRAVENOUS
  Administered 2013-06-17: 2.77 mg via INTRAVENOUS
  Administered 2013-06-18: 10:00:00 via INTRAVENOUS
  Administered 2013-06-18: 4.25 mg via INTRAVENOUS
  Administered 2013-06-18: 6.89 mg via INTRAVENOUS
  Administered 2013-06-18: 16:00:00 via INTRAVENOUS
  Administered 2013-06-18: 5.28 mg via INTRAVENOUS
  Administered 2013-06-18: 23:00:00 via INTRAVENOUS
  Administered 2013-06-18: 0.3 mg via INTRAVENOUS
  Administered 2013-06-18: 3.99 mg via INTRAVENOUS
  Administered 2013-06-19: 18.7 mg via INTRAVENOUS
  Administered 2013-06-19: 7.49 mg via INTRAVENOUS
  Administered 2013-06-19: 4.79 mg via INTRAVENOUS
  Administered 2013-06-19: 16.51 mg via INTRAVENOUS
  Administered 2013-06-19: 5.95 mg via INTRAVENOUS
  Administered 2013-06-19 (×2): 0.3 mg via INTRAVENOUS
  Administered 2013-06-19: 14:00:00 via INTRAVENOUS
  Administered 2013-06-20: 7.19 mg via INTRAVENOUS
  Administered 2013-06-20: 7.57 mg via INTRAVENOUS
  Filled 2013-06-17 (×17): qty 25

## 2013-06-17 MED ORDER — PANTOPRAZOLE SODIUM 40 MG IV SOLR
40.0000 mg | Freq: Every day | INTRAVENOUS | Status: DC
  Filled 2013-06-17 (×3): qty 40

## 2013-06-17 MED ORDER — DEXTROSE 5 % IV SOLN
2000.0000 mg | Freq: Every day | INTRAVENOUS | Status: DC
  Administered 2013-06-18 – 2013-06-19 (×3): 2000 mg via INTRAVENOUS
  Filled 2013-06-17 (×4): qty 2

## 2013-06-17 MED ORDER — POLYETHYLENE GLYCOL 3350 17 G PO PACK
17.0000 g | PACK | Freq: Every day | ORAL | Status: DC | PRN
  Filled 2013-06-17: qty 1

## 2013-06-17 MED ORDER — ONDANSETRON HCL 4 MG/2ML IJ SOLN: 4.0000 mg | INTRAMUSCULAR | Status: DC | PRN

## 2013-06-17 MED ORDER — ASPIRIN 325 MG PO TABS
325.0000 mg | ORAL_TABLET | Freq: Every day | ORAL | Status: DC
  Administered 2013-06-18 – 2013-06-20 (×4): 325 mg via ORAL
  Filled 2013-06-17 (×4): qty 1

## 2013-06-17 MED ORDER — KETOROLAC TROMETHAMINE 30 MG/ML IJ SOLN
30.0000 mg | Freq: Once | INTRAMUSCULAR | Status: AC
  Administered 2013-06-17: 30 mg via INTRAVENOUS
  Filled 2013-06-17: qty 1

## 2013-06-17 MED ORDER — HYDROMORPHONE HCL PF 2 MG/ML IJ SOLN
2.0000 mg | INTRAMUSCULAR | Status: AC | PRN
  Administered 2013-06-17 (×2): 2 mg via INTRAVENOUS
  Filled 2013-06-17 (×2): qty 1

## 2013-06-17 MED ORDER — HEPARIN SOD (PORK) LOCK FLUSH 100 UNIT/ML IV SOLN
500.0000 [IU] | INTRAVENOUS | Status: DC | PRN
  Filled 2013-06-17: qty 5

## 2013-06-17 MED ORDER — PANTOPRAZOLE SODIUM 40 MG PO TBEC
40.0000 mg | DELAYED_RELEASE_TABLET | Freq: Every day | ORAL | Status: DC
  Administered 2013-06-17 – 2013-06-20 (×4): 40 mg via ORAL
  Filled 2013-06-17 (×4): qty 1

## 2013-06-17 MED ORDER — KCL IN DEXTROSE-NACL 30-5-0.45 MEQ/L-%-% IV SOLN
INTRAVENOUS | Status: DC
Start: 2013-06-17 — End: 2013-06-18
  Administered 2013-06-17 – 2013-06-18 (×2): via INTRAVENOUS
  Filled 2013-06-17 (×4): qty 1000

## 2013-06-17 MED ORDER — KETOROLAC TROMETHAMINE 15 MG/ML IJ SOLN
15.0000 mg | Freq: Four times a day (QID) | INTRAMUSCULAR | Status: DC
  Administered 2013-06-17 – 2013-06-20 (×11): 15 mg via INTRAVENOUS
  Filled 2013-06-17 (×17): qty 1

## 2013-06-17 MED ORDER — IOHEXOL 350 MG/ML SOLN
100.0000 mL | Freq: Once | INTRAVENOUS | Status: AC | PRN
  Administered 2013-06-17: 100 mL via INTRAVENOUS

## 2013-06-17 MED ORDER — SODIUM CHLORIDE 0.9 % IJ SOLN: 9.0000 mL | INTRAMUSCULAR | Status: DC | PRN

## 2013-06-17 MED ORDER — DIPHENHYDRAMINE HCL 50 MG/ML IJ SOLN
12.5000 mg | Freq: Four times a day (QID) | INTRAMUSCULAR | Status: DC | PRN
  Administered 2013-06-17 – 2013-06-20 (×7): 12.5 mg via INTRAVENOUS
  Filled 2013-06-17 (×7): qty 1

## 2013-06-17 MED ORDER — DIPHENHYDRAMINE HCL 50 MG/ML IJ SOLN
12.5000 mg | Freq: Once | INTRAMUSCULAR | Status: AC
  Administered 2013-06-17: 12.5 mg via INTRAVENOUS
  Filled 2013-06-17: qty 1

## 2013-06-17 MED ORDER — ATORVASTATIN CALCIUM 80 MG PO TABS
80.0000 mg | ORAL_TABLET | Freq: Once | ORAL | Status: AC
  Administered 2013-06-18: 80 mg via ORAL
  Filled 2013-06-17: qty 1

## 2013-06-17 NOTE — Discharge Summary (Signed)
[facility] Discharge Summary   Patient ID: [name] MRN: [id number] DOB/AGE: 1980-08-02 33 y.o.  Admit date: 06/17/2013 Discharge date: 06/17/2013  Primary Care Physician:  [name],[name], MD  Admission Diagnoses:  Active Problems:   Hx of pulmonary embolus   Leukocytosis, unspecified   Uses marijuana   Unspecified vitamin D deficiency   Hypokalemia   Sickle cell crisis   Bilateral flank pain   Sickle cell anemia   Discharge Diagnoses:   Hypoxia  Discharge Medications:    Medication List    ASK your doctor about these medications       folic acid 1 MG tablet  Commonly known as:  FOLVITE  Take 1 tablet (1 mg total) by mouth every morning.     HYDROmorphone 4 MG tablet  Commonly known as:  DILAUDID  Take 1 tablet (4 mg total) by mouth every 4 (four) hours as needed for severe pain.     hydroxyurea 500 MG capsule  Commonly known as:  HYDREA  Take 1,000 mg by mouth daily. May take with food to minimize GI side effects.     morphine 15 MG 12 hr tablet  Commonly known as:  MS CONTIN  Take 1 tablet (15 mg total) by mouth 2 (two) times daily.     potassium chloride SA 20 MEQ tablet  Commonly known as:  K-DUR,KLOR-CON  Take 1 tablet (20 mEq total) by mouth every morning.     XARELTO 20 MG Tabs tablet  Generic drug:  rivaroxaban  Take 20 mg by mouth daily with supper.     zolpidem 10 MG tablet  Commonly known as:  AMBIEN  Take 1 tablet (10 mg total) by mouth at bedtime as needed for sleep.         Consults:  None  Significant Diagnostic Studies:  Dg Chest 2 View  06/17/2013   CLINICAL DATA:  Pain, sickle cell crisis  EXAM: CHEST  2 VIEW  COMPARISON:  06/02/2013  FINDINGS: Cardiac shadow is stable. A left chest wall port is again seen in satisfactory position. Patchy nodular changes are noted particularly in the right lung base similar to that seen on recent CT examination. No new focal infiltrate or sizable effusion is noted.   IMPRESSION: Stable right basilar parenchymal nodules.   Electronically Signed   By: Inez Catalina M.D.   On: 06/17/2013 07:42   Dg Chest 2 View  06/02/2013   CLINICAL DATA:  Sickle cell crisis.  Shortness of breath.  EXAM: CHEST  2 VIEW  COMPARISON:  05/14/2013  FINDINGS: Left chest wall port a catheter is noted with tip in the cavoatrial junction. The heart size is enlarged. The lung volumes are low and there is scarring within both lung bases. No pleural effusions or edema. No airspace consolidation.  IMPRESSION: 1. Low lung volumes with bibasilar scarring.  . 2. Cardiac enlargement.   Electronically Signed   By: Kerby Moors M.D.   On: 06/02/2013 09:36   Ct Angio Chest Pe W/cm &/or Wo Cm  06/02/2013   CLINICAL DATA:  Shortness of breath, chest pain, hypoxia, tachycardia, history of sickle cell anemia, evaluate for pulmonary embolism  EXAM: CT ANGIOGRAPHY CHEST WITH CONTRAST  TECHNIQUE: Multidetector CT imaging of the chest was performed using the standard protocol during bolus administration of intravenous contrast. Multiplanar CT image reconstructions and MIPs were obtained to evaluate the vascular anatomy.  CONTRAST:  124mL OMNIPAQUE IOHEXOL 350 MG/ML SOLN  COMPARISON:  CT ANGIO  CHEST W/CM &/OR WO/CM dated 05/18/2013; CT ANGIO CHEST W/CM &/OR WO/CM dated 03/26/2013; CT ANGIO CHEST W/CM &/OR WO/CM dated 11/13/2012; CT ANGIO CHEST W/CM &/OR WO/CM dated 08/27/2012  FINDINGS: Vascular Findings:  There is suboptimal adequate opacification of the pulmonary arterial system with the main pulmonary artery measuring only 187 Hounsfield units. Given this limitation, there are no discrete filling defects within the pulmonary arterial tree to the level of the bilateral subsegmental pulmonary arteries. Evaluation of distal subsegmental pulmonary arteries is degraded secondary to suboptimal vessel opacification and patient respiratory artifact. There is grossly unchanged marked enlargement of the main pulmonary artery  measuring approximately 38 mm in diameter.  Cardiomegaly. No pericardial effusion. Normal caliber of the thoracic aorta. A Bentson configuration of the aortic arch. Branch vessels of the aortic arch widely patent throughout their imaged course. No definite thoracic aortic dissection or periaortic stranding.  There is a left anterior chest wall subclavian vein approach port a catheter with tip terminating within the superior aspect of the right atrium.  Review of the MIP images confirms the above findings.   ----------------------------------------------------------------------------------  Nonvascular Findings:  Evaluation of the pulmonary parenchyma is degraded secondary to patient respiratory artifact.  Interval development / worsening of a ill-defined potentially partially cavitary approximately 2.0 x 1.4 cm opacity within the peripheral aspect of the superior segment of the right lower lobe (image 67, series 9). There is an additional ill-defined area of ground-glass within the subpleural aspect of the right lower lobe (image 65, series 9).  There is grossly symmetric subpleural dependent subsegmental atelectasis within the bilateral lower lobes and caudal segment of the lingula. No pleural effusion or pneumothorax. The central pulmonary airways appear widely patent. Shotty prevascular lymph nodes are individually not enlarged by size criteria with index prevascular lymph node measuring approximately 0.9 cm in greatest short axis diameter (image 46, series 6). No mediastinal, hilar or axillary lymphadenopathy.  Early arterial phase evaluation of the upper abdomen is normal.  No definite acute or aggressive osseous abnormalities. Accentuated kyphosis within in the superior aspect of the thoracic spine, likely positional. Stigmata of sickle cell disease affecting the imaged osseous structures of the chest including mild diffuse increased slightly mottled sclerosis and fish-mouth deformities about multiple  intervertebral disc spaces within in the mid cranial and mid aspects of the thoracic spine.  Thyroid gland appears normal given obliquity. Regional soft tissues appear normal.  IMPRESSION: 1. No evidence of pulmonary embolism to the level of the bilateral subsegmental pulmonary arteries. 2. Interval development of airspace opacities within the right lower and to a lesser extent, the right middle lobes, worrisome for developing areas of infection and/or acute chest syndrome in this patient with known sickle cell disease. 3. Cardiomegaly with enlargement of the caliber of the main pulmonary artery, nonspecific though could be seen in the setting of pulmonary arterial hypertension. Further evaluation could be performed with cardiac echo as clinically indicated.   Electronically Signed   By: Sandi Mariscal M.D.   On: 06/02/2013 12:02     [facility] Course: Patient admitted to sickle cell day hosital for extended observation. Was started on hypotonic  IVFs for cellular rehydration. Patient was started on weight based PCA dilaudid. Patient used a total of 17.51 mg with 25 demands and 23 deliveries. Patient was experiencing oxygen desaturation on room air. Lowest desaturation was 78% with ambulation on room air. Notified Dr. Wynetta Emery, ordered an ABG PO2 49%. Will transfer to stepdown unit for a higher level  of care. Concerned of possible PE due to current hypoxia,  patient's history of pulmonary emboli, and he admits to missing several doses of Xarelto over the past weeks.  Ordered STAT CT/angiogram, transfuse 2 units of packed red blood cells, and hemoglobin electrophoresis. Will continue home medications as previously prescribed  upon transfer. Patient in stable condition prior to transfer.     Physical Exam at Discharge:  BP 132/81  Pulse 94  Temp(Src) 98.5 F (36.9 C) (Oral)  Resp 21  Wt 165 lb (74.844 kg)  SpO2 93% Constitutional: Appears well-developed and well-nourished. Mild distress.   HENT: Normocephalic. External right and left ear normal.  Eyes: Conjunctivae and EOM are normal. PERRLA, pronounced bilateral scleral icterus.  Neck: Normal ROM. Neck supple. No JVD. No tracheal deviation.  CVS: normal S1/S2 sounds, no gallops, no carotid bruit.  Pulmonary: Effort and breath sounds normal, no stridor, rhonchi, wheezes, rales.  Chest: anterior chest wall tenderness with palpation. Normal expansion of chest wall with breathing.  Musculoskeletal: Normal range of motion. Bilateral flank tenderness with palpation. No bony tenderness of lumbar spine.  Neuro: Alert. Normal reflexes, muscle tone coordination. No cranial nerve deficit.    Disposition at Discharge: Transfer to step down unit.   Discharge Orders:   Condition at Discharge:   Stable  Time spent on Discharge:  Greater than 30 minutes.  Signed: Dorena Dew 06/17/2013, 5:19 PM

## 2013-06-17 NOTE — ED Notes (Signed)
Report called to sickle cell clinic.  Patient to be transported by clinic.

## 2013-06-17 NOTE — H&P (Signed)
History and Physical Examination  [name] [id number] DOB: 1980-06-19 DOA: 06/17/2013  PCP: [name],[name], MD   Chief Complaint: sickle cell crisis pain and shortness of breath  HPI: [name] is a 33 y.o. male with sickle cell anemia and history of pulmonary embolus on chronic anticoagulation who was seen earlier today in the emergency department and then subsequently transferred for extended observation in the sickle cell center.  The patient was noted to be hypoxic requiring at least 3 L of oxygen.  He was noted to have significant desaturation to 85-88% oxygen saturation levels on room air when ambulating.  The patient reported that he has missed several doses of his Xarelto medication.  The patient reports that over the past 2 weeks he has missed approximately 3 doses of the medication.  Given his history of pulmonary embolus, with most recent episode occurring after he had missed a dose of Xarelto and his ABG reveals a PO2 of 45 hospitalization was requested for further evaluation and management.  The patient reported that he only has occasional shortness of breath.  He denies having chest pain.  He did have a recent NSTEMI during a prior hospitalization.    In terms of his sickle cell crisis he reported that he's been having one week of symptoms consistent with anterior chest wall and rib cage pain and bilateral flank pain in addition to pain in the legs.  He reports that he was treated in the extended observation unit of the sickle cell center and the pain improved but later returned.  He reports that it has been cyclic over the past week.  He was seen in the emergency department 2 days ago and subsequently returned early this morning.   Past Medical History Past Medical History  Diagnosis Date  . Sickle cell anemia   . Blood transfusion   . 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 Hypokalemia   . Mood disorder   . History of pulmonary  embolus (PE)   . Avascular necrosis   . Leukocytosis     Chronic  . Thrombocytosis     Chronic  . Hypertension   . History of Clostridium difficile infection   . Uses marijuana   . Chronic anticoagulation   . Functional asplenia   . Former smoker   . Second hand tobacco smoke exposure   . Alcohol consumption of one to four drinks per day     Past Surgical History Past Surgical History  Procedure Laterality Date  . Right hip replacement      08/2006  . Cholecystectomy      01/2008  . Porta cath placement    . Porta cath removal    . Umbilical hernia repair      01/2008  . Excision of left periauricular cyst      10/2009  . Excision of right ear lobe cyst with primary closur      11/2007  . Portacath placement  01/05/2012    Procedure: INSERTION PORT-A-CATH;  Surgeon: Odis Hollingshead, MD;  Location: [facility];  Service: General;  Laterality: N/A;  ultrasound guiced port a cath insertion with fluoroscopy    Home Meds: Prior to Admission medications   Medication Sig Start Date End Date Taking? Authorizing Provider  folic acid (FOLVITE) 1 MG tablet Take 1 tablet (1 mg total) by mouth every morning. 06/05/12  Yes [name], MD  HYDROmorphone (DILAUDID) 4 MG tablet Take 1 tablet (  4 mg total) by mouth every 4 (four) hours as needed for severe pain. 05/23/13  Yes Dorena Dew, FNP  hydroxyurea (HYDREA) 500 MG capsule Take 1,000 mg by mouth daily. May take with food to minimize GI side effects. 03/30/13  Yes Elwyn Reach, MD  morphine (MS CONTIN) 15 MG 12 hr tablet Take 1 tablet (15 mg total) by mouth 2 (two) times daily. 05/23/13  Yes Dorena Dew, FNP  potassium chloride SA (K-DUR,KLOR-CON) 20 MEQ tablet Take 1 tablet (20 mEq total) by mouth every morning. 03/30/13  Yes Elwyn Reach, MD  Rivaroxaban (XARELTO) 20 MG TABS tablet Take 20 mg by mouth daily with supper.   Yes Historical Provider, MD  zolpidem (AMBIEN) 10 MG tablet Take 1 tablet (10 mg total) by mouth at  bedtime as needed for sleep. 04/24/13  Yes Dorena Dew, FNP   Allergies: Morphine and related  Social History:  History   Social History  . Marital Status: Single    Spouse Name: N/A    Number of Children: 0  . Years of Education: 13   Occupational History  . 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 Smoking status: Former Smoker -- 13 years    Types: Cigarettes    Quit date: 07/08/2010  . Smokeless tobacco: Never Used  . Alcohol Use: 3.6 oz/week    6 Shots of liquor per week  . Drug Use: 2.00 per week    Special: Marijuana  . Sexual Activity: Yes    Partners: Female    Patent examiner Protection: None   Other Topics Concern  . Not on file   Social History Narrative   Lives in an apartment.  Single.  Lives alone but has a girlfriend that helps care for him.  Does not use any assist devices.        [name]:  [phone] Mom, emergency contact   Family History:  Family History  Problem Relation Age of Onset  . Sickle cell anemia Mother   . Sickle cell anemia Father   . Sickle cell trait Brother     Review of Systems:  Constitutional: No weight loss, night sweats, Fevers, chills, fatigue.  HEENT: No headaches, dizziness, seizures, vision changes, difficulty swallowing,Tooth/dental problems,Sore throat, No sneezing, itching, ear ache, nasal congestion, post nasal drip,  Cardio-vascular: bilateral rib cage and chest wall pain, Denies Orthopnea, PND, swelling in lower extremities, anasarca, dizziness, palpitations  GI: No heartburn, indigestion, abdominal pain, nausea, vomiting, diarrhea, change in bowel habits, loss of appetite  Resp: No shortness of breath with exertion or at rest. No excess mucus, no productive cough, No non-productive cough, No coughing up of blood.No change in color of mucus.No wheezing. No chest wall deformity  Skin: no rash or lesions.  GU: bilateral flank pain, No dysuria, change in  color of urine, no urgency or frequency.  Musculoskeletal: No joint pain or swelling. No decreased range of motion. No back pain.  Psych: No change in mood or affect. No depression or anxiety. No memory loss. All other systems reviewed and reported as negative.   Physical Exam: Blood pressure 132/81, pulse 94, temperature 98.5 F (36.9 C), temperature source Oral, resp. rate 21, weight 165 lb (74.844 kg), SpO2 93.00%. Constitutional: Appears well-developed and well-nourished. No distress. Sitting in bed eating breakfast.  HENT: Normocephalic. External right and left ear normal.  Eyes: Conjunctivae and EOM  are normal. PERRLA, pronounced bilateral scleral icterus.  Neck: Normal ROM. Neck supple. No JVD. No tracheal deviation.  CVS: normal S1/S2 sounds, no murmurs, no gallops, no carotid bruit.  Pulmonary: Effort and breath sounds normal, no stridor, rhonchi, wheezes, rales.  Chest: anterior chest wall tenderness with palpation, no bruising seen. Normal expansion of chest wall with breathing.  Abdominal: Soft. BS +, no distension, tenderness, rebound or guarding.  Musculoskeletal: Normal range of motion. Bilateral flank tenderness with palpation involving muscles of lower back. No bony tenderness of lumbar spine.  Lymphadenopathy: No lymphadenopathy noted, cervical, inguinal. Neuro: Alert. Normal reflexes, muscle tone coordination. No cranial nerve deficit.  Skin: Skin is warm and dry.  Psychiatric: Normal mood and affect.  Lab  And Imaging results:  Results for orders placed during the hospital encounter of 06/17/13 (from the past 24 hour(s))  CBC WITH DIFFERENTIAL     Status: Abnormal   Collection Time    06/17/13  8:00 AM      Result Value Ref Range   WBC 21.1 (*) 4.0 - 10.5 K/uL   RBC 2.27 (*) 4.22 - 5.81 MIL/uL   Hemoglobin 7.4 (*) 13.0 - 17.0 g/dL   HCT 21.7 (*) 39.0 - 52.0 %   MCV 95.6  78.0 - 100.0 fL   MCH 32.6  26.0 - 34.0 pg   MCHC 34.1  30.0 - 36.0 g/dL   RDW 22.1 (*) 11.5  - 15.5 %   Platelets 480 (*) 150 - 400 K/uL   Neutrophils Relative % 75  43 - 77 %   Lymphocytes Relative 11 (*) 12 - 46 %   Monocytes Relative 10  3 - 12 %   Eosinophils Relative 3  0 - 5 %   Basophils Relative 1  0 - 1 %   Neutro Abs 15.9 (*) 1.7 - 7.7 K/uL   Lymphs Abs 2.3  0.7 - 4.0 K/uL   Monocytes Absolute 2.1 (*) 0.1 - 1.0 K/uL   Eosinophils Absolute 0.6  0.0 - 0.7 K/uL   Basophils Absolute 0.2 (*) 0.0 - 0.1 K/uL   RBC Morphology SICKLE CELLS     Smear Review LARGE PLATELETS PRESENT    COMPREHENSIVE METABOLIC PANEL     Status: Abnormal   Collection Time    06/17/13  8:00 AM      Result Value Ref Range   Sodium 140  137 - 147 mEq/L   Potassium 3.5 (*) 3.7 - 5.3 mEq/L   Chloride 106  96 - 112 mEq/L   CO2 23  19 - 32 mEq/L   Glucose, Bld 96  70 - 99 mg/dL   BUN 6  6 - 23 mg/dL   Creatinine, Ser 0.60  0.50 - 1.35 mg/dL   Calcium 8.9  8.4 - 10.5 mg/dL   Total Protein 6.7  6.0 - 8.3 g/dL   Albumin 3.6  3.5 - 5.2 g/dL   AST 42 (*) 0 - 37 U/L   ALT 26  0 - 53 U/L   Alkaline Phosphatase 103  39 - 117 U/L   Total Bilirubin 5.4 (*) 0.3 - 1.2 mg/dL   GFR calc non Af Amer >90  >90 mL/min   GFR calc Af Amer >90  >90 mL/min  RETICULOCYTES     Status: Abnormal   Collection Time    06/17/13  8:00 AM      Result Value Ref Range   Retic Ct Pct 18.6 (*) 0.4 - 3.1 %   RBC.  2.27 (*) 4.22 - 5.81 MIL/uL   Retic Count, Manual 422.2 (*) 19.0 - 186.0 K/uL  LACTATE DEHYDROGENASE     Status: Abnormal   Collection Time    06/17/13 11:23 AM      Result Value Ref Range   LDH 434 (*) 94 - 250 U/L  MAGNESIUM     Status: None   Collection Time    06/17/13 11:23 AM      Result Value Ref Range   Magnesium 1.5  1.5 - 2.5 mg/dL  HIV ANTIBODY (ROUTINE TESTING)     Status: None   Collection Time    06/17/13 11:23 AM      Result Value Ref Range   HIV 1&2 Ab, 4th Generation NONREACTIVE  NONREACTIVE  URINALYSIS, ROUTINE W REFLEX MICROSCOPIC     Status: None   Collection Time    06/17/13  2:04 PM       Result Value Ref Range   Color, Urine YELLOW  YELLOW   APPearance CLEAR  CLEAR   Specific Gravity, Urine 1.011  1.005 - 1.030   pH 7.0  5.0 - 8.0   Glucose, UA NEGATIVE  NEGATIVE mg/dL   Hgb urine dipstick NEGATIVE  NEGATIVE   Bilirubin Urine NEGATIVE  NEGATIVE   Ketones, ur NEGATIVE  NEGATIVE mg/dL   Protein, ur NEGATIVE  NEGATIVE mg/dL   Urobilinogen, UA 1.0  0.0 - 1.0 mg/dL   Nitrite NEGATIVE  NEGATIVE   Leukocytes, UA NEGATIVE  NEGATIVE  BLOOD GAS, ARTERIAL     Status: Abnormal   Collection Time    06/17/13  4:37 PM      Result Value Ref Range   FIO2 0.21     Delivery systems ROOM AIR     pH, Arterial 7.367  7.350 - 7.450   pCO2 arterial 41.5  35.0 - 45.0 mmHg   pO2, Arterial 45.6 (*) 80.0 - 100.0 mmHg   Bicarbonate 23.3  20.0 - 24.0 mEq/L   TCO2 22.6  0 - 100 mmol/L   Acid-base deficit 1.4  0.0 - 2.0 mmol/L   O2 Saturation 69.5     Patient temperature 98.6     Collection site RIGHT RADIAL     Drawn by [phone]     Sample type ARTERIAL DRAW     Allens test (pass/fail) PASS  PASS   Impression   Hypoxemia    Hx of pulmonary embolus   Leukocytosis, unspecified   Uses marijuana   Unspecified vitamin D deficiency   Hypokalemia   Sickle cell crisis   Bilateral flank pain   Sickle cell anemia  Plan  The patient has hypoxemia of an undetermined source.  I'm concerned about recurrent pulmonary embolus given that he has missed doses of his Xarelto medication.  He could also have an acute chest syndrome associated with his sickle cell anemia versus pneumonia.  I'm sending him to have a stat CT angiogram of the chest.  In addition he is going to be admitted to a telemetry unit for further monitoring because of his significant hypoxemia and potential to decompensate.  Continue supplemental oxygen therapy.  Transfuse 2 units packed red blood cells.  Check immediate hemoglobin electrophoresis.  Follow cardiac enzymes to rule out acute myocardial ischemia.  Follow electrolytes  closely.  The patient has been receiving potassium in his IV fluids.  Follow magnesium as well.  Continue Dilaudid PCA at an individualized dose for treatment of the sickle cell crisis pain.  Resume home long acting  pain medication (MS Contin).  Further treatment pending results of workup.   CT angio: No PE seen. Nonspecific peripheral areas of ground-glass/airspace opacity and focal consolidation. This may represent pneumonia, atelectasis or acute chest syndrome changes. The right lower lobe area of focal consolidation is stable to minimally increased.  Given these findings will empirically start antibiotics for HCAP (given recent hospitalization) and follow clinically.    [place], [place] 06/17/2013, 6:49 PM

## 2013-06-17 NOTE — Discharge Instructions (Signed)
Sickle Cell Anemia, Adult °Sickle cell anemia is a condition in which red blood cells have an abnormal "sickle" shape. This abnormal shape shortens the cells' life span, which results in a lower than normal concentration of red blood cells in the blood. The sickle shape also causes the cells to clump together and block free blood flow through the blood vessels. As a result, the tissues and organs of the body do not receive enough oxygen. Sickle cell anemia causes organ damage and pain and increases the risk of infection. °CAUSES  °Sickle cell anemia is a genetic disorder. Those who receive two copies of the gene have the condition, and those who receive one copy have the trait. °RISK FACTORS °The sickle cell gene is most common in people whose families originated in [place]. Other areas of the globe where sickle cell trait occurs include the Mediterranean, [place] and [place], the [place], and the [place].  °SIGNS AND SYMPTOMS °· Pain, especially in the extremities, back, chest, or abdomen (common). The pain may start suddenly or may develop following an illness, especially if there is dehydration. Pain can also occur due to overexertion or exposure to extreme temperature changes. °· Frequent severe bacterial infections, especially certain types of pneumonia and meningitis. °· Pain and swelling in the hands and feet. °· Decreased activity.   °· Loss of appetite.   °· Change in behavior. °· Headaches. °· Seizures. °· Shortness of breath or difficulty breathing. °· Vision changes. °· Skin ulcers. °Those with the trait may not have symptoms or they may have mild symptoms.  °DIAGNOSIS  °Sickle cell anemia is diagnosed with blood tests that demonstrate the genetic trait. It is often diagnosed during the newborn period, due to mandatory testing nationwide. A variety of blood tests, X-rays, CT scans, MRI scans, ultrasounds, and lung function tests may also be done to monitor the condition. °TREATMENT  °Sickle  cell anemia may be treated with: °· Medicines. You may be given pain medicines, antibiotic medicines (to treat and prevent infections) or medicines to increase the production of certain types of hemoglobin. °· Fluids. °· Oxygen. °· Blood transfusions. °HOME CARE INSTRUCTIONS  °· Drink enough fluid to keep your urine clear or pale yellow. Increase your fluid intake in hot weather and during exercise. °· Do not smoke. Smoking lowers oxygen levels in the blood.   °· Only take over-the-counter or prescription medicines for pain, fever, or discomfort as directed by your health care provider. °· Take antibiotics as directed by your health care provider. Make sure you finish them it even if you start to feel better.   °· Take supplements as directed by your health care provider.   °· Consider wearing a medical alert bracelet. This tells anyone caring for you in an emergency of your condition.   °· When traveling, keep your medical information, health care provider's names, and the medicines you take with you at all times.   °· If you develop a fever, do not take medicines to reduce the fever right away. This could cover up a problem that is developing. Notify your health care provider. °· Keep all follow-up appointments with your health care provider. Sickle cell anemia requires regular medical care. °SEEK MEDICAL CARE IF: ° You have a fever. °SEEK IMMEDIATE MEDICAL CARE IF:  °· You feel dizzy or faint.   °· You have new abdominal pain, especially on the left side near the stomach area.   °· You develop a persistent, often uncomfortable and painful penile erection (priapism). If this is not treated immediately it   will lead to impotence.   °· You have numbness your arms or legs or you have a hard time moving them.   °· You have a hard time with speech.   °· You have a fever or persistent symptoms for more than 2 3 days.   °· You have a fever and your symptoms suddenly get worse.   °· You have signs or symptoms of infection.  These include:   °· Chills.   °· Abnormal tiredness (lethargy).   °· Irritability.   °· Poor eating.   °· Vomiting.   °· You develop pain that is not helped with medicine.   °· You develop shortness of breath. °· You have pain in your chest.   °· You are coughing up pus-like or bloody sputum.   °· You develop a stiff neck. °· Your feet or hands swell or have pain. °· Your abdomen appears bloated. °· You develop joint pain. °MAKE SURE YOU: °· Understand these instructions. °· Will watch your child's condition. °· Will get help right away if your child is not doing well or gets worse. °Document Released: 04/27/2005 Document Revised: 11/07/2012 Document Reviewed: 08/29/2012 °[employer]® Patient Information ©2014 [employer]. ° °

## 2013-06-17 NOTE — Progress Notes (Addendum)
Shift event: Troponin resulted high at 1.61. Pt here with sickle cell crisis and acute chest syndrome. Had chest pain on admission but none now. 12 lead EKG looks similar to one on 06/03/13, during which hospitalization, he had a NSTEMI thought to be secondary to demand ischemia. Cardio set up an outpt appt after that discharge which it is unknown if pt kept. This NP spoke to cardio fellow, Dr. Delane Ginger, about labs, history, etc. Dr. Delane Ginger advised heparin per pharmacy consult, especially in lieu of Xarelto dose tonight. ASA given and pt placed on Lipitor also as recommended by cardio. Spoke to PharmD, JC, who will discuss with colleagues, but likely, will not start Heparin until about noon tomorrow secondary to tonight's xarelto dose. Clance Boll, NP [facility] Update: Next troponin trending downward.  Report given to oncoming attending. Cardio to officially see pt this am. Patrica Duel, NP

## 2013-06-17 NOTE — Progress Notes (Signed)
Per MD request, ambulating O2 rechecked on dinamap; O2 85-88% RA, HR 105-111. MD present and aware.

## 2013-06-17 NOTE — Discharge Summary (Signed)
Patient was determined to be hypoxic during subsequent evaluations in the extended observation Center.  I ordered a stat ABG pn room air which revealed a low PO2 of 45.  Given these findings and the ongoing chronic nature of this acute crisis going to admit the patient to the step down unit for further evaluation.  The patient will be transfused 2 units packed red blood cells.  An immediate hemoglobin electrophoresis will be ordered as well.  I ordered a stat CT angiogram to rule out pulmonary embolus given that the patient has missed several doses of his chronic anticoagulant Xarelto.  Will continue to monitor closely and make adjustments to clinical course as needed.  I also discussed this case with the patient's primary care provider Dr. Zigmund Daniel who also agreed with a step-down admission.  I agree with assessment and plan as detailed by Cammie Sickle, NP.    Gerlene Fee, MD, CDE, [place], [place]

## 2013-06-17 NOTE — ED Notes (Signed)
Bed: TJ03 Expected date:  Expected time:  Means of arrival:  Comments: EMS/34 yo with flank pain/sickle cell

## 2013-06-17 NOTE — Progress Notes (Signed)
ANTIBIOTIC CONSULT NOTE - INITIAL  Pharmacy Consult for Vancomycin and Cefepime Indication: HCAP  Allergies  Allergen Reactions  . Morphine And Related Hives and Rash    Pt states he can take PO, but not IV and he is able to tolerate Dilaudid with no reactions.    Patient Measurements: Height: 6' 0.05" (183 cm) Weight: 165 lb (74.844 kg) IBW/kg (Calculated) : 77.71  Vital Signs: Temp: 98.4 F (36.9 C) (05/18 1852) Temp src: Oral (05/18 1852) BP: 146/99 mmHg (05/18 1852) Pulse Rate: 96 (05/18 1852) Intake/Output from previous day:   Intake/Output from this shift:    Labs:  Recent Labs  06/16/13 1308 06/17/13 0800  WBC 23.5* 21.1*  HGB 6.8* 7.4*  PLT 542* 480*  CREATININE 0.55 0.60   Estimated Creatinine Clearance: 137.7 ml/min (by C-G formula based on Cr of 0.6). No results found for this basename: VANCOTROUGH, Corlis Leak, VANCORANDOM, GENTTROUGH, GENTPEAK, GENTRANDOM, TOBRATROUGH, TOBRAPEAK, TOBRARND, AMIKACINPEAK, AMIKACINTROU, AMIKACIN,  in the last 72 hours   Microbiology: Recent Results (from the past 720 hour(s))  MRSA PCR SCREENING     Status: None   Collection Time    06/02/13  3:33 PM      Result Value Ref Range Status   MRSA by PCR NEGATIVE  NEGATIVE Final   Comment:            The GeneXpert MRSA Assay (FDA     approved for NASAL specimens     only), is one component of a     comprehensive MRSA colonization     surveillance program. It is not     intended to diagnose MRSA     infection nor to guide or     monitor treatment for     MRSA infections.  CULTURE, BLOOD (ROUTINE X 2)     Status: None   Collection Time    06/02/13  3:54 PM      Result Value Ref Range Status   Specimen Description BLOOD LEFT ARM   Final   Special Requests BOTTLES DRAWN AEROBIC AND ANAEROBIC 10CC   Final   Culture  Setup Time     Final   Value: 06/03/2013 00:11     Performed at [facility]   Culture     Final   Value: NO GROWTH 5 DAYS     Performed at  [facility]   Report Status 06/09/2013 FINAL   Final  CULTURE, BLOOD (ROUTINE X 2)     Status: None   Collection Time    06/02/13  6:50 PM      Result Value Ref Range Status   Specimen Description BLOOD PORTA CATH   Final   Special Requests BOTTLES DRAWN AEROBIC AND ANAEROBIC 10CC   Final   Culture  Setup Time     Final   Value: 06/03/2013 02:51     Performed at [facility]   Culture     Final   Value: NO GROWTH 5 DAYS     Performed at [facility]   Report Status 06/09/2013 FINAL   Final    Medical History: Past Medical History  Diagnosis Date  . Sickle cell anemia   . Blood transfusion   . 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 Hypokalemia   . Mood disorder   . History of pulmonary embolus (PE)   . Avascular necrosis   . Leukocytosis     Chronic  . Thrombocytosis  Chronic  . Hypertension   . History of Clostridium difficile infection   . Uses marijuana   . Chronic anticoagulation   . Functional asplenia   . Former smoker   . Second hand tobacco smoke exposure   . Alcohol consumption of one to four drinks per day     Medications:  Scheduled:  . deferoxamine (DESFERAL) IV  2,000 mg Intravenous QHS  . folic acid  1 mg Oral q morning - 10a  . HYDROmorphone PCA 0.3 mg/mL   Intravenous 6 times per day  . hydroxyurea  1,000 mg Oral Daily  . morphine  15 mg Oral BID  . pantoprazole  40 mg Oral Daily   Or  . pantoprazole (PROTONIX) IV  40 mg Intravenous Daily  . potassium chloride SA  20 mEq Oral q morning - 10a  . rivaroxaban  20 mg Oral Q supper   Infusions:  . dexrose 5 % and 0.45 % NaCl with KCl 30 mEq/L 100 mL/hr at 06/17/13 1206   PRN: diphenhydrAMINE, diphenhydrAMINE, heparin lock flush, naloxone, ondansetron (ZOFRAN) IV, ondansetron, polyethylene glycol, sodium chloride, sodium chloride  Assessment: 33 yo male with sickle cell disease and Hx PE on xarelto, well known to Pharmacy from previous  admissions, most recent being 5/3-5/6. Admitted to ICU again on 5/18 with pain crisis and shortness of breath. Pharmacy consulted to dose Vancomycin and Cefepime for empiric treatment of HCAP.  During previous admission, vancomycin doses of 2g x 1 then 1250mg  IV q8h resulted in a therapeutic trough of 12.5 mcg/ml. Weight and SCr is similar this admission, so will proceed with the same regimen.  Goal of Therapy:  Vancomycin trough level 15-20 mcg/ml Cefepime dose per renal function  Plan:   Vancomycin 2g IV x 1, then 1250mg  IV q8h Check trough at steady state Cefepime 2g IV x 1, then 1g IV q8h Follow up renal function & cultures   Peggyann Juba, PharmD, BCPS Pager: [phone] 06/17/2013,7:27 PM

## 2013-06-17 NOTE — Progress Notes (Signed)
Per CT tech, to verify port accessed with power huber needle. Noted per documentation, power port accessed with 19G huber needle. This RN informed patient of need to change port needle for CT scan. Patient acknowledges. This RN [employer]. No swelling, redness, or tenderness noted upon palpation. Reaccessed port with 20G PH needle, no complications. Patient has no complaints. To continue to monitor.

## 2013-06-17 NOTE — ED Notes (Signed)
Per EMS patient reports bilateral flank pain x 1 week. Hx of sickle cell and reports this feels the same. Pain 9/10.  Reports slight shortness of breath BP150/70 HR108

## 2013-06-17 NOTE — Progress Notes (Signed)
Patient desat to 88% on RA when ambulating to the bathroom,NP notified. Ambulating O2 checked per NP request ; O2 88% on RA, HR 101, 20 RR, pain rating 5-6/10. Patient denies light-headedness, increased pain, or SOB with ambulating. At rest post ambulating, encouraged to use IS to 1750, O2 88% RA, 102 HR. O2 94% 3L, 96 HR. NP notified and acknowledged.

## 2013-06-17 NOTE — ED Provider Notes (Signed)
CSN: [id number]     Arrival date & time 06/17/13  2706 History   First MD Initiated Contact with Patient 06/17/13 [phone]     Chief Complaint  Patient presents with  . Flank Pain  . Sickle Cell Pain Crisis      The history is provided by the patient and medical records.   patient reports ongoing pain in his bilateral chest in the lateral and posterior aspects.  He reports no difficulty breathing.  No pleuritic nature of his pain.  History of pulmonary embolism on Xarelto.  Patient has a history of sickle cell disease reports this feels like his typical sickle cell crisis related pain.  His pain has not been treated adequately at home with his home pain medications which include MS Contin and Dilaudid.  His pain is moderate to severe in severity at this time.  He denies fevers and chills.  No cough or congestion.  No abdominal pain.  No urinary complaints.  Past Medical History  Diagnosis Date  . Sickle cell anemia   . Blood transfusion   . 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 Hypokalemia   . Mood disorder   . History of pulmonary embolus (PE)   . Avascular necrosis   . Leukocytosis     Chronic  . Thrombocytosis     Chronic  . Hypertension   . History of Clostridium difficile infection   . Uses marijuana   . Chronic anticoagulation   . Functional asplenia   . Former smoker   . Second hand tobacco smoke exposure   . Alcohol consumption of one to four drinks per day    Past Surgical History  Procedure Laterality Date  . Right hip replacement      08/2006  . Cholecystectomy      01/2008  . Porta cath placement    . Porta cath removal    . Umbilical hernia repair      01/2008  . Excision of left periauricular cyst      10/2009  . Excision of right ear lobe cyst with primary closur      11/2007  . Portacath placement  01/05/2012    Procedure: INSERTION PORT-A-CATH;  Surgeon: Odis Hollingshead, MD;  Location: [place];  Service: General;  Laterality: N/A;   ultrasound guiced port a cath insertion with fluoroscopy   Family History  Problem Relation Age of Onset  . Sickle cell anemia Mother   . Sickle cell anemia Father   . Sickle cell trait Brother    History  Substance Use Topics  . Smoking status: Former Smoker -- 13 years    Types: Cigarettes    Quit date: 07/08/2010  . Smokeless tobacco: Never Used  . Alcohol Use: 3.6 oz/week    6 Shots of liquor per week    Review of Systems  Genitourinary: Positive for flank pain.  All other systems reviewed and are negative.     Allergies  Morphine and related  Home Medications   Prior to Admission medications   Medication Sig Start Date End Date Taking? Authorizing Provider  celecoxib (CELEBREX) 200 MG capsule Take 1 capsule (200 mg total) by mouth 2 (two) times daily. 03/30/13   Elwyn Reach, MD  ergocalciferol (VITAMIN D2) 50000 UNITS capsule Take 50,000 Units by mouth once a week. On Tuesday 04/22/13   Dorena Dew, FNP  folic acid (FOLVITE) 1 MG tablet Take 1 tablet (1 mg total) by  mouth every morning. 06/05/12   [name], MD  HYDROmorphone (DILAUDID) 4 MG tablet Take 1 tablet (4 mg total) by mouth every 4 (four) hours as needed for severe pain. 05/23/13   Dorena Dew, FNP  hydroxyurea (HYDREA) 500 MG capsule Take 1,000 mg by mouth daily. May take with food to minimize GI side effects. 03/30/13   Elwyn Reach, MD  morphine (MS CONTIN) 15 MG 12 hr tablet Take 1 tablet (15 mg total) by mouth 2 (two) times daily. 05/23/13   Dorena Dew, FNP  potassium chloride SA (K-DUR,KLOR-CON) 20 MEQ tablet Take 1 tablet (20 mEq total) by mouth every morning. 03/30/13   Elwyn Reach, MD  Rivaroxaban (XARELTO) 20 MG TABS tablet Take 20 mg by mouth daily with supper.    Historical Provider, MD  zolpidem (AMBIEN) 10 MG tablet Take 1 tablet (10 mg total) by mouth at bedtime as needed for sleep. 04/24/13   Dorena Dew, FNP   BP 156/88  Pulse 87  Temp(Src) 99.1 F (37.3  C) (Oral)  Resp 18  Wt 165 lb (74.844 kg)  SpO2 91% Physical Exam  Nursing note and vitals reviewed. Constitutional: He is oriented to person, place, and time. He appears well-developed and well-nourished.  HENT:  Head: Normocephalic and atraumatic.  Eyes: EOM are normal.  Neck: Normal range of motion.  Cardiovascular: Normal rate, regular rhythm, normal heart sounds and intact distal pulses.   Pulmonary/Chest: Effort normal and breath sounds normal. No respiratory distress. He exhibits no tenderness.  Abdominal: Soft. He exhibits no distension. There is no tenderness.  Musculoskeletal: Normal range of motion.  Neurological: He is alert and oriented to person, place, and time.  Skin: Skin is warm and dry.  Psychiatric: He has a normal mood and affect. Judgment normal.    ED Course  Procedures (including critical care time) Labs Review Labs Reviewed  CBC WITH DIFFERENTIAL - Abnormal; Notable for the following:    WBC 21.1 (*)    RBC 2.27 (*)    Hemoglobin 7.4 (*)    HCT 21.7 (*)    RDW 22.1 (*)    Platelets 480 (*)    All other components within normal limits  COMPREHENSIVE METABOLIC PANEL - Abnormal; Notable for the following:    Potassium 3.5 (*)    AST 42 (*)    Total Bilirubin 5.4 (*)    All other components within normal limits  RETICULOCYTES - Abnormal; Notable for the following:    Retic Ct Pct 18.6 (*)    RBC. 2.27 (*)    Retic Count, Manual 422.2 (*)    All other components within normal limits    ED CARE PLAN PER Dr Zigmund Daniel NITIAL CARE PLAN: Acute Vaso-Occlusive Episode (OPIATE TOLERANT) INITIAL LABS: CBC with diff, CMET, Reticulocyte INITIAL DOSE: Dilaudid 4 mg then administer 50% of previous dose every 1/2 hour for 3 doses. Then repeat Dilaudid 4 mg within 2 ours of 1st dose. If no improvement in pain Place on Dilaudid PCA (full dose) with 2-3 mg IV dilaudid every 2 hours PRN as rescue doses. Administer Toradol 30 mg IV ASAP and schedule every 6 hours  unless otherwise contraindicated. IVF: D5.45 @ 100-125 ml/hr unless another IVF indicated for medical reasons   Imaging Review Dg Chest 2 View  06/17/2013   CLINICAL DATA:  Pain, sickle cell crisis  EXAM: CHEST  2 VIEW  COMPARISON:  06/02/2013  FINDINGS: Cardiac shadow is stable. A left chest wall  port is again seen in satisfactory position. Patchy nodular changes are noted particularly in the right lung base similar to that seen on recent CT examination. No new focal infiltrate or sizable effusion is noted.  IMPRESSION: Stable right basilar parenchymal nodules.   Electronically Signed   By: Inez Catalina M.D.   On: 06/17/2013 07:42  I personally reviewed the imaging tests through PACS system I reviewed available ER/hospitalization records through the EMR    EKG Interpretation None      MDM   Final diagnoses:  None    9:00 AM Patient still with ongoing pain.  I discussed his case with Dr. Wynetta Emery of the sickle cell clinic/day hospital.  He exhibits the patient today.  His nursing staff will come down and picked the patient up.  Patient be discharged from the emergency department.    Hoy Morn, MD 06/17/13 [phone]

## 2013-06-17 NOTE — H&P (Signed)
[facility] History and Physical  [name] VHQ:[id number] DOB: 12-Oct-1980 DOA: 06/17/2013   PCP: [name],[name], MD   Chief Complaint: bilateral rib and flank pain   HPI: This patient is a 33 year old gentleman with hemoglobin SS disease and history of PE on chronic anticoagulation with Xarelto with multiple recent hospitalizations for sickle cell crisis and acute chest syndrome presented to the emergency department earlier this morning reporting that he has been having uncontrolled pain in the chest wall and bilateral flank for the last several hours.  He reports that for the past week he's been having exacerbations of crisis.  He was recently seen in the day hospital 3 days ago and treated for crisis and the pain improve significantly enough for him to be discharged home.  He reports that he did fine until the pain started to return earlier this morning.  At that time he did not have flank pain.  He reports no burning with urination.  He reports no blood in urine.  He reports no history of nephrolithiasis.  The patient reports no fever chills nausea or vomiting.  He reports that the flank pain involves the muscles of the lower back he also has pain in the rib cage where he typically has symptoms of sickle cell crisis.  He has been seen in the emergency department and had an essentially stable chest x-ray.  He's been given IV Dilaudid injections with no significant improvement in pain.  He reports his pain is still 7-8/10.  I spoke with the emergency room department physician was requesting transfer for extended observation in the sickle cell center.  Review of Systems:  Constitutional: No weight loss, night sweats, Fevers, chills, fatigue.  HEENT: No headaches, dizziness, seizures, vision changes, difficulty swallowing,Tooth/dental problems,Sore throat, No sneezing, itching, ear ache, nasal congestion, post nasal drip,  Cardio-vascular: bilateral rib cage and chest wall  pain, Denies Orthopnea, PND, swelling in lower extremities, anasarca, dizziness, palpitations  GI: No heartburn, indigestion, abdominal pain, nausea, vomiting, diarrhea, change in bowel habits, loss of appetite  Resp: No shortness of breath with exertion or at rest. No excess mucus, no productive cough, No non-productive cough, No coughing up of blood.No change in color of mucus.No wheezing. No chest wall deformity  Skin: no rash or lesions.  GU: bilateral flank pain,  No dysuria, change in color of urine, no urgency or frequency. Musculoskeletal: No joint pain or swelling. No decreased range of motion. No back pain.  Psych: No change in mood or affect. No depression or anxiety. No memory loss.   Past Medical History  Diagnosis Date  . Sickle cell anemia   . Blood transfusion   . 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 Hypokalemia   . Mood disorder   . History of pulmonary embolus (PE)   . Avascular necrosis   . Leukocytosis     Chronic  . Thrombocytosis     Chronic  . Hypertension   . History of Clostridium difficile infection   . Uses marijuana   . Chronic anticoagulation   . Functional asplenia   . Former smoker   . Second hand tobacco smoke exposure   . Alcohol consumption of one to four drinks per day    Past Surgical History  Procedure Laterality Date  . Right hip replacement      08/2006  . Cholecystectomy      01/2008  . Porta cath placement    . Porta  cath removal    . Umbilical hernia repair      01/2008  . Excision of left periauricular cyst      10/2009  . Excision of right ear lobe cyst with primary closur      11/2007  . Portacath placement  01/05/2012    Procedure: INSERTION PORT-A-CATH;  Surgeon: Odis Hollingshead, MD;  Location: [place];  Service: General;  Laterality: N/A;  ultrasound guiced port a cath insertion with fluoroscopy   Social History:  reports that he quit smoking about 2 years ago. His smoking use included Cigarettes.  He smoked 0.00 packs per day for 13 years. He has never used smokeless tobacco. He reports that he drinks about 3.6 ounces of alcohol per week. He reports that he uses illicit drugs (Marijuana) about twice per week.  Allergies  Allergen Reactions  . Morphine And Related Hives and Rash    Pt states he can take PO, but not IV and he is able to tolerate Dilaudid with no reactions.    Family History  Problem Relation Age of Onset  . Sickle cell anemia Mother   . Sickle cell anemia Father   . Sickle cell trait Brother     Prior to Admission medications   Medication Sig Start Date End Date Taking? Authorizing Provider  folic acid (FOLVITE) 1 MG tablet Take 1 tablet (1 mg total) by mouth every morning. 06/05/12  Yes [name], MD  HYDROmorphone (DILAUDID) 4 MG tablet Take 1 tablet (4 mg total) by mouth every 4 (four) hours as needed for severe pain. 05/23/13  Yes Dorena Dew, FNP  hydroxyurea (HYDREA) 500 MG capsule Take 1,000 mg by mouth daily. May take with food to minimize GI side effects. 03/30/13  Yes Elwyn Reach, MD  morphine (MS CONTIN) 15 MG 12 hr tablet Take 1 tablet (15 mg total) by mouth 2 (two) times daily. 05/23/13  Yes Dorena Dew, FNP  potassium chloride SA (K-DUR,KLOR-CON) 20 MEQ tablet Take 1 tablet (20 mEq total) by mouth every morning. 03/30/13  Yes Elwyn Reach, MD  Rivaroxaban (XARELTO) 20 MG TABS tablet Take 20 mg by mouth daily with supper.   Yes Historical Provider, MD  zolpidem (AMBIEN) 10 MG tablet Take 1 tablet (10 mg total) by mouth at bedtime as needed for sleep. 04/24/13  Yes Dorena Dew, FNP   Physical Exam: Filed Vitals:   06/17/13 0701 06/17/13 0900  BP: 156/88 131/76  Pulse: 87 90  Temp: 99.1 F (37.3 C)   TempSrc: Oral   Resp: 18   Weight: 165 lb (74.844 kg)   SpO2: 91% 95%   Constitutional: Appears well-developed and well-nourished. No distress. Sitting in bed eating breakfast.  HENT: Normocephalic. External right and left  ear normal.  Eyes: Conjunctivae and EOM are normal. PERRLA, pronounced bilateral scleral icterus.  Neck: Normal ROM. Neck supple. No JVD. No tracheal deviation.  CVS: normal S1/S2 sounds, no murmurs, no gallops, no carotid bruit.  Pulmonary: Effort and breath sounds normal, no stridor, rhonchi, wheezes, rales.  Chest: anterior chest wall tenderness with palpation, no bruising seen. Normal expansion of chest wall with breathing.   Abdominal: Soft. BS +, no distension, tenderness, rebound or guarding.  Musculoskeletal: Normal range of motion. Bilateral flank tenderness with palpation involving muscles of lower back. No bony tenderness of lumbar spine.  Lymphadenopathy: No lymphadenopathy noted, cervical, inguinal. Neuro: Alert. Normal reflexes, muscle tone coordination. No cranial nerve deficit.  Skin: Skin is  warm and dry.  Psychiatric: Normal mood and affect.   Labs on Admission:   Basic Metabolic Panel:  Recent Labs Lab 06/14/13 0454 06/16/13 1308 06/17/13 0800  NA 139 140 140  K 3.6* 4.2 3.5*  CL 103 106 106  CO2 23 25 23   GLUCOSE 100* 97 96  BUN 4* 6 6  CREATININE 0.69 0.55 0.60  CALCIUM 8.7 8.9 8.9   Liver Function Tests:  Recent Labs Lab 06/16/13 1308 06/17/13 0800  AST 45* 42*  ALT 29 26  ALKPHOS 98 103  BILITOT 5.9* 5.4*  PROT 7.2 6.7  ALBUMIN 3.9 3.6   CBC:  Recent Labs Lab 06/14/13 0454 06/16/13 1308 06/17/13 0800  WBC 16.8* 23.5* 21.1*  NEUTROABS  --  16.9* PENDING  HGB 7.6* 6.8* 7.4*  HCT 22.0* 20.5* 21.7*  MCV 96.1 95.8 95.6  PLT 553* 542* 480*   BNP: No components found with this basename: POCBNP,   Radiological Exams on Admission: Dg Chest 2 View  06/17/2013   CLINICAL DATA:  Pain, sickle cell crisis  EXAM: CHEST  2 VIEW  COMPARISON:  06/02/2013  FINDINGS: Cardiac shadow is stable. A left chest wall port is again seen in satisfactory position. Patchy nodular changes are noted particularly in the right lung base similar to that seen on  recent CT examination. No new focal infiltrate or sizable effusion is noted.  IMPRESSION: Stable right basilar parenchymal nodules.   Electronically Signed   By: Inez Catalina M.D.   On: 06/17/2013 07:42    Assessment/Plan: Acute HbSS vaso-occlusive Crisis Bilateral Flank Pain  Sickle Cell Anemia  Jaundice/scleral icterus  Vit D Deficiency  History of pulmonary embolus  Leukocytosis, chronic Hypokalemia  Thrombocytosis  Admit patient to extended observation in the sickle cell center. Continue hypotonic IVFs adding additional potassium. Reviewed labs done this morning in ER. Add urinalysis, urine tox screen, urine GC/Chl, check magnesium.   Because pt's pain continues to be 7 or higher after IV dilaudid given in ER, will initiate weight based dilaudid PCA. Hemoglobin stable at 7.4. No clinical indication for transfusion at this time. Monitor clinical course and adjust therapy accordingly. Use adjunctive therapies including K-pad, Toradol for inflammation given in ER.  encourage incentive spirometry. Resume home hydrea, long-acting MS Contin, Xarelto, folic acid.   Time spent: 40 mins  Code Status: FULL  Family Communication: n/a  Disposition Plan: pending clinical course   Murlean Iba, MD  Pager [phone]  If 7PM-7AM, please contact night-coverage [email or web address] Password [email or web address] 06/17/2013, 9:30 AM

## 2013-06-18 ENCOUNTER — Inpatient Hospital Stay (HOSPITAL_COMMUNITY): Payer: Medicare Other

## 2013-06-18 ENCOUNTER — Ambulatory Visit: Payer: Medicare Other | Admitting: Internal Medicine

## 2013-06-18 DIAGNOSIS — I272 Pulmonary hypertension, unspecified: Secondary | ICD-10-CM | POA: Diagnosis present

## 2013-06-18 DIAGNOSIS — I2789 Other specified pulmonary heart diseases: Secondary | ICD-10-CM

## 2013-06-18 DIAGNOSIS — D5701 Hb-SS disease with acute chest syndrome: Secondary | ICD-10-CM

## 2013-06-18 DIAGNOSIS — I10 Essential (primary) hypertension: Secondary | ICD-10-CM

## 2013-06-18 DIAGNOSIS — J189 Pneumonia, unspecified organism: Secondary | ICD-10-CM

## 2013-06-18 DIAGNOSIS — D5700 Hb-SS disease with crisis, unspecified: Principal | ICD-10-CM

## 2013-06-18 DIAGNOSIS — I214 Non-ST elevation (NSTEMI) myocardial infarction: Secondary | ICD-10-CM

## 2013-06-18 HISTORY — DX: Hb-SS disease with acute chest syndrome: D57.01

## 2013-06-18 LAB — CBC WITH DIFFERENTIAL/PLATELET
BASOS ABS: 0 10*3/uL (ref 0.0–0.1)
Basophils Relative: 0 % (ref 0–1)
EOS ABS: 0.8 10*3/uL — AB (ref 0.0–0.7)
EOS PCT: 4 % (ref 0–5)
HCT: 21.3 % — ABNORMAL LOW (ref 39.0–52.0)
Hemoglobin: 7.1 g/dL — ABNORMAL LOW (ref 13.0–17.0)
LYMPHS ABS: 4.9 10*3/uL — AB (ref 0.7–4.0)
Lymphocytes Relative: 23 % (ref 12–46)
MCH: 32.1 pg (ref 26.0–34.0)
MCHC: 33.3 g/dL (ref 30.0–36.0)
MCV: 96.4 fL (ref 78.0–100.0)
MYELOCYTES: 1 %
Metamyelocytes Relative: 1 %
Monocytes Absolute: 1.9 10*3/uL — ABNORMAL HIGH (ref 0.1–1.0)
Monocytes Relative: 9 % (ref 3–12)
NEUTROS PCT: 62 % (ref 43–77)
Neutro Abs: 13.5 10*3/uL — ABNORMAL HIGH (ref 1.7–7.7)
PLATELETS: 427 10*3/uL — AB (ref 150–400)
RBC: 2.21 MIL/uL — ABNORMAL LOW (ref 4.22–5.81)
RDW: 22 % — ABNORMAL HIGH (ref 11.5–15.5)
WBC: 21.1 10*3/uL — ABNORMAL HIGH (ref 4.0–10.5)

## 2013-06-18 LAB — COMPREHENSIVE METABOLIC PANEL
ALBUMIN: 3.5 g/dL (ref 3.5–5.2)
ALT: 22 U/L (ref 0–53)
AST: 33 U/L (ref 0–37)
Alkaline Phosphatase: 79 U/L (ref 39–117)
BUN: 4 mg/dL — ABNORMAL LOW (ref 6–23)
CHLORIDE: 102 meq/L (ref 96–112)
CO2: 24 mEq/L (ref 19–32)
Calcium: 8.6 mg/dL (ref 8.4–10.5)
Creatinine, Ser: 0.54 mg/dL (ref 0.50–1.35)
GFR calc Af Amer: >90 mL/min (ref >90)
Glucose, Bld: 89 mg/dL (ref 70–99)
Potassium: 3.6 mEq/L — ABNORMAL LOW (ref 3.7–5.3)
SODIUM: 137 meq/L (ref 137–147)
Total Bilirubin: 4.1 mg/dL — ABNORMAL HIGH (ref 0.3–1.2)
Total Protein: 6.8 g/dL (ref 6.0–8.3)

## 2013-06-18 LAB — TROPONIN I
Troponin I: 0.89 ng/mL (ref <0.30)
Troponin I: 0.58 ng/mL (ref <0.30)

## 2013-06-18 LAB — LACTATE DEHYDROGENASE: LDH: 409 U/L — AB (ref 94–250)

## 2013-06-18 LAB — MAGNESIUM: MAGNESIUM: 1.5 mg/dL (ref 1.5–2.5)

## 2013-06-18 LAB — APTT: aPTT: 42 seconds — ABNORMAL HIGH (ref 24–37)

## 2013-06-18 MED ORDER — POTASSIUM CHLORIDE CRYS ER 20 MEQ PO TBCR
40.0000 meq | EXTENDED_RELEASE_TABLET | Freq: Every day | ORAL | Status: DC
  Administered 2013-06-19 – 2013-06-20 (×2): 40 meq via ORAL
  Filled 2013-06-18 (×2): qty 2

## 2013-06-18 MED ORDER — HYDROMORPHONE HCL 2 MG PO TABS
4.0000 mg | ORAL_TABLET | ORAL | Status: DC
  Administered 2013-06-18: 4 mg via ORAL
  Filled 2013-06-18: qty 2

## 2013-06-18 MED ORDER — SODIUM CHLORIDE 0.9 % IJ SOLN
10.0000 mL | INTRAMUSCULAR | Status: DC | PRN
  Administered 2013-06-19 – 2013-06-20 (×2): 10 mL

## 2013-06-18 MED ORDER — RIVAROXABAN 20 MG PO TABS
20.0000 mg | ORAL_TABLET | Freq: Every day | ORAL | Status: DC
  Administered 2013-06-18 – 2013-06-19 (×2): 20 mg via ORAL
  Filled 2013-06-18 (×3): qty 1

## 2013-06-18 MED ORDER — SODIUM CHLORIDE 0.9 % IJ SOLN
10.0000 mL | Freq: Two times a day (BID) | INTRAMUSCULAR | Status: DC
  Administered 2013-06-18: 10 mL

## 2013-06-18 MED ORDER — DEXTROSE-NACL 5-0.45 % IV SOLN
INTRAVENOUS | Status: DC
  Administered 2013-06-18: 15:00:00 via INTRAVENOUS

## 2013-06-18 MED ORDER — HYDROMORPHONE HCL PF 2 MG/ML IJ SOLN: 4.0000 mg | INTRAMUSCULAR | Status: DC | PRN

## 2013-06-18 MED ORDER — POTASSIUM CHLORIDE CRYS ER 20 MEQ PO TBCR
40.0000 meq | EXTENDED_RELEASE_TABLET | ORAL | Status: AC
  Administered 2013-06-18 (×2): 40 meq via ORAL
  Filled 2013-06-18 (×2): qty 2

## 2013-06-18 MED ORDER — LISINOPRIL 10 MG PO TABS
10.0000 mg | ORAL_TABLET | Freq: Every day | ORAL | Status: DC
  Administered 2013-06-18 – 2013-06-20 (×3): 10 mg via ORAL
  Filled 2013-06-18 (×3): qty 1

## 2013-06-18 MED ORDER — METOPROLOL TARTRATE 25 MG PO TABS
25.0000 mg | ORAL_TABLET | Freq: Two times a day (BID) | ORAL | Status: DC
  Administered 2013-06-18 – 2013-06-20 (×5): 25 mg via ORAL
  Filled 2013-06-18 (×6): qty 1

## 2013-06-18 MED ORDER — MAGNESIUM SULFATE 40 MG/ML IJ SOLN
2.0000 g | Freq: Once | INTRAMUSCULAR | Status: AC
  Administered 2013-06-18: 2 g via INTRAVENOUS
  Filled 2013-06-18: qty 50

## 2013-06-18 MED ORDER — HEPARIN (PORCINE) IN NACL 100-0.45 UNIT/ML-% IJ SOLN
1000.0000 [IU]/h | INTRAMUSCULAR | Status: DC
  Filled 2013-06-18: qty 250

## 2013-06-18 NOTE — Progress Notes (Signed)
Peripherally Inserted Central Catheter/Midline Placement  The IV Nurse has discussed with the patient and/or persons authorized to consent for the patient, the purpose of this procedure and the potential benefits and risks involved with this procedure.  The benefits include less needle sticks, lab draws from the catheter and patient may be discharged home with the catheter.  Risks include, but not limited to, infection, bleeding, blood clot (thrombus formation), and puncture of an artery; nerve damage and irregular heat beat.  Alternatives to this procedure were also discussed.  PICC/Midline Placement Documentation        [name] 06/18/2013, 3:50 PM

## 2013-06-18 NOTE — Progress Notes (Signed)
CARE MANAGEMENT NOTE 06/18/2013  Patient:  [name], [name]   Account Number:  [id number]  Date Initiated:  06/18/2013  Documentation initiated by:  Oletta Buehring  Subjective/Objective Assessment:   pt with hx of scc, admitted with crisi and elevated tropoins, hgb 7.1, 02 at 85%, hypotensive     Action/Plan:   home when stable   Anticipated DC Date:  06/21/2013   Anticipated DC Plan:  HOME/SELF CARE  In-house referral  NA      DC Planning Services  NA      PAC Choice  NA   Choice offered to / List presented to:  NA   DME arranged  NA      DME agency  NA     [place] arranged  NA      [place] agency  NA   Status of service:  In process, will continue to follow Medicare Important Message given?  NA - LOS <3 / Initial given by admissions (If response is "NO", the following Medicare IM given date fields will be blank) Date Medicare IM given:   Date Additional Medicare IM given:    Discharge Disposition:    Per UR Regulation:  Reviewed for med. necessity/level of care/duration of stay  If discussed at [place] of Stay Meetings, dates discussed:    Comments:  [id number] Eldridge Dace, BSN, [place] [phone] Chart Reviewed for discharge and hospital needs. Discharge needs at time of review: None present will follow for needs. Review of patient progress due on [id number].

## 2013-06-18 NOTE — Progress Notes (Signed)
ANTICOAGULATION CONSULT NOTE - Initial Consult  Pharmacy Consult for Heparin Indication: chest pain/ACS  Allergies  Allergen Reactions  . Morphine And Related Hives and Rash    Pt states he can take PO, but not IV and he is able to tolerate Dilaudid with no reactions.    Patient Measurements: Height: 6' 0.05" (183 cm) Weight: 167 lb 5.3 oz (75.9 kg) IBW/kg (Calculated) : 77.71 Heparin Dosing Weight:   Vital Signs: Temp: 98.7 F (37.1 C) (05/19 0400) Temp src: Oral (05/19 0400) BP: 134/86 mmHg (05/19 0406) Pulse Rate: 77 (05/19 0406)  Labs:  Recent Labs  06/16/13 1308 06/17/13 0800 06/17/13 2045  HGB 6.8* 7.4*  --   HCT 20.5* 21.7*  --   PLT 542* 480*  --   CREATININE 0.55 0.60  --   TROPONINI  --   --  1.61*    Estimated Creatinine Clearance: 139.7 ml/min (by C-G formula based on Cr of 0.6).   Medical History: Past Medical History  Diagnosis Date  . Sickle cell anemia   . Blood transfusion   . 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 Hypokalemia   . Mood disorder   . History of pulmonary embolus (PE)   . Avascular necrosis   . Leukocytosis     Chronic  . Thrombocytosis     Chronic  . Hypertension   . History of Clostridium difficile infection   . Uses marijuana   . Chronic anticoagulation   . Functional asplenia   . Former smoker   . Second hand tobacco smoke exposure   . Alcohol consumption of one to four drinks per day     Medications:  Prescriptions prior to admission  Medication Sig Dispense Refill  . folic acid (FOLVITE) 1 MG tablet Take 1 tablet (1 mg total) by mouth every morning.  30 tablet  11  . HYDROmorphone (DILAUDID) 4 MG tablet Take 1 tablet (4 mg total) by mouth every 4 (four) hours as needed for severe pain.  90 tablet  0  . hydroxyurea (HYDREA) 500 MG capsule Take 1,000 mg by mouth daily. May take with food to minimize GI side effects.      Marland Kitchen morphine (MS CONTIN) 15 MG 12 hr tablet Take 1 tablet (15 mg  total) by mouth 2 (two) times daily.  60 tablet  0  . potassium chloride SA (K-DUR,KLOR-CON) 20 MEQ tablet Take 1 tablet (20 mEq total) by mouth every morning.  30 tablet  3  . Rivaroxaban (XARELTO) 20 MG TABS tablet Take 20 mg by mouth daily with supper.      . zolpidem (AMBIEN) 10 MG tablet Take 1 tablet (10 mg total) by mouth at bedtime as needed for sleep.  30 tablet  0   Infusions:  . dexrose 5 % and 0.45 % NaCl with KCl 30 mEq/L 1,000 mL (06/17/13 1950)    Assessment: Patient with ACS and positive CE.  Prior to admission patient on rivaroxaban 20mg  daily (last dose ~1700 5/18).  Will check both PTT and heparin level.  Will adjust heparin based on PTT, until the PTT and heparin level correlate.  Goal of Therapy:  PTT 66-102 sec Heparin level 0.3-0.7 units/ml Monitor platelets by anticoagulation protocol: Yes   Plan:  Begin heparin without bolus, at 1200 (~75% q24hr rivaroxaban dosing interval) at 1000 units/hr. Check PTT/heparin level at 2000 Daily [place]. 06/18/2013,5:57 AM

## 2013-06-18 NOTE — Progress Notes (Signed)
SICKLE CELL SERVICE PROGRESS NOTE  [name] QVZ:[id number] DOB: 05/10/1980 DOA: 06/17/2013 PCP: [name],[name], MD  Assessment/Plan: Principal Problem:   Acute chest syndrome Active Problems:   NSTEMI (non-ST elevated myocardial infarction)   Sickle cell crisis   Hx of pulmonary embolus   Leukocytosis, unspecified   Hypokalemia   Bilateral flank pain   Sickle cell anemia   Hypoxemia   Pulmonary HTN  1. Acute Chest Syndrome: Pt presents with acute chest syndrome which is likely caused by an infectious process. He will need transfusion to a goal of 10 g/dL to decrease the percentage of Sickle Cell which is ultimately the cause of the Acute Chest syndrome. He has had 2 units of PRBC's ordered which have not yet been transfused. His Oxygen also need to be supplemented to a goal of 97%. 2. Elevated troponin: Pt has normal renal function however in the context of inverted T-waves with elevated troponin this is likely NSTEMI. Continue ASA. Start Beta-blocker. I have requested an official Cardiology consult. Per notes last night it appears that a Heparin gtt was recommended per phone consult with Cardiology.  Will continue to check Troponin. Need to increase oxygen carrying capacity with transfused blood. 3. Pneumonia vs atelectasis: CT scan shows consolidation and in light of Acute Chest Syndrome, I agree with empiric antibiotics. Continue for coverage of atypical and nosocomial pathogens. 4. HTN: Pt has known HTN but has not been compliant with Lisinopril will restart at 10 mg daily. 5. Hypokalemia: Replete orally and check Mg levels 6. Leukocytosis: Likely a combination of infectious and inflammatory etiology. Continue antibiotics and Toradol. Will continue to monitor WBC. 7. Hb SS with crisis: Continue pain management. Electrophoresis pending. Will treat with oral and bolus doses while blood infusing. Continue Hydrea and folic acid. 8. H/O PE: Pt on chronic anticoagulation with  Xarelto which is on hold while receiving Heparin gtt. 9. ETOH use: Pt has recently (within the last year) started binging on the weekends with friends. This is likely a significant contributor to his chronic state of health. He has been counseled against further use.   Code Status: Full Code Family Communication: N/A Disposition Plan: Not yet ready for discharge  [name]  Pager [phone]. If 7PM-7AM, please contact night-coverage.  06/18/2013, 9:43 AM  LOS: 1 day   Brief narrative: [name] is a 33 y.o. male with sickle cell anemia and history of pulmonary embolus on chronic anticoagulation who was seen earlier today in the emergency department and then subsequently transferred for extended observation in the sickle cell center. The patient was noted to be hypoxic requiring at least 3 L of oxygen. He was noted to have significant desaturation to 85-88% oxygen saturation levels on room air when ambulating. The patient reported that he has missed several doses of his Xarelto medication. The patient reports that over the past 2 weeks he has missed approximately 3 doses of the medication. Given his history of pulmonary embolus, with most recent episode occurring after he had missed a dose of Xarelto and his ABG reveals a PO2 of 45 hospitalization was requested for further evaluation and management. The patient reported that he only has occasional shortness of breath. He denies having chest pain. He did have a recent NSTEMI during a prior hospitalization.  In terms of his sickle cell crisis he reported that he's been having one week of symptoms consistent with anterior chest wall and rib cage pain and bilateral flank pain in addition to pain  in the legs. He reports that he was treated in the extended observation unit of the sickle cell center and the pain improved but later returned. He reports that it has been cyclic over the past week. He was seen in the emergency department 2 days ago  and subsequently returned early this morning.    Consultants:  Cardiology  Procedures:  None  Antibiotics:  Cefepime 5/18>>  Vancomycin 5/18 >>  HPI/Subjective: Pt states that he has pain in B/L ribs and posterior chest wall. No anterior chest wall pain. 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 He states that he has been mostly compliant with Hydrea but has missed some of his doses. He has also missed some of his Xarelto doses usually when he works odd hours. Objective: Filed Vitals:   06/18/13 0600 06/18/13 0730 06/18/13 0800 06/18/13 0815  BP: 130/86  144/101   Pulse: 69  87 93  Temp:      TempSrc:      Resp: 16 14 16 23   Height:      Weight:      SpO2: 91% 91% 91% 89%   Weight change:   Intake/Output Summary (Last 24 hours) at 06/18/13 0943 Last data filed at 06/18/13 0800  Gross per 24 hour  Intake 2594.4 ml  Output   3100 ml  Net -505.6 ml    General: Alert, awake, oriented x3, in no acute distress. Dusky appearing. HEENT: [place]/AT PEERL, EOMI, mild icterus Neck: Trachea midline,  no masses, no thyromegal,y no JVD, no carotid bruit OROPHARYNX:  Moist, No exudate/ erythema/lesions.  Heart: Regular rate and rhythm, without murmurs, rubs, gallops.  Lungs: Clear to auscultation, no wheezing or rhonchi noted.  Abdomen: Soft, nontender, nondistended, positive bowel sounds, no masses no hepatosplenomegaly noted.  Neuro: No focal neurological deficits noted cranial nerves II through XII grossly intact. Strength normal in bilateral upper and lower extremities. Musculoskeletal: No warm swelling or erythema around joints, no spinal tenderness noted. Psychiatric: Patient alert and oriented x3, good insight and cognition, good recent to remote recall. Lymph node survey: No cervical axillary or inguinal lymphadenopathy noted.   Data Reviewed: Basic Metabolic Panel:  Recent Labs Lab 06/14/13 0454 06/16/13 1308 06/17/13 0800  06/17/13 1123 06/18/13 0535  NA 139 140 140  --  137  K 3.6* 4.2 3.5*  --  3.6*  CL 103 106 106  --  102  CO2 23 25 23   --  24  GLUCOSE 100* 97 96  --  89  BUN 4* 6 6  --  4*  CREATININE 0.69 0.55 0.60  --  0.54  CALCIUM 8.7 8.9 8.9  --  8.6  MG  --   --   --  1.5  --    Liver Function Tests:  Recent Labs Lab 06/16/13 1308 06/17/13 0800 06/18/13 0535  AST 45* 42* 33  ALT 29 26 22   ALKPHOS 98 103 79  BILITOT 5.9* 5.4* 4.1*  PROT 7.2 6.7 6.8  ALBUMIN 3.9 3.6 3.5   No results found for this basename: LIPASE, AMYLASE,  in the last 168 hours No results found for this basename: AMMONIA,  in the last 168 hours CBC:  Recent Labs Lab 06/14/13 0454 06/16/13 1308 06/17/13 0800 06/18/13 0535  WBC 16.8* 23.5* 21.1* 21.1*  NEUTROABS  --  16.9* 15.9* 13.5*  HGB 7.6* 6.8* 7.4* 7.1*  HCT 22.0* 20.5* 21.7* 21.3*  MCV 96.1 95.8 95.6  96.4  PLT 553* 542* 480* 427*   Cardiac Enzymes:  Recent Labs Lab 06/17/13 2045 06/18/13 0535  TROPONINI 1.61* 0.89*   BNP (last 3 results)  Recent Labs  11/13/12 1826  PROBNP 106.3   CBG: No results found for this basename: GLUCAP,  in the last 168 hours  Recent Results (from the past 240 hour(s))  MRSA PCR SCREENING     Status: None   Collection Time    06/17/13  6:52 PM      Result Value Ref Range Status   MRSA by PCR NEGATIVE  NEGATIVE Final   Comment:            The GeneXpert MRSA Assay (FDA     approved for NASAL specimens     only), is one component of a     comprehensive MRSA colonization     surveillance program. It is not     intended to diagnose MRSA     infection nor to guide or     monitor treatment for     MRSA infections.     Studies: Dg Chest 2 View  06/17/2013   CLINICAL DATA:  Pain, sickle cell crisis  EXAM: CHEST  2 VIEW  COMPARISON:  06/02/2013  FINDINGS: Cardiac shadow is stable. A left chest wall port is again seen in satisfactory position. Patchy nodular changes are noted particularly in the right lung  base similar to that seen on recent CT examination. No new focal infiltrate or sizable effusion is noted.  IMPRESSION: Stable right basilar parenchymal nodules.   Electronically Signed   By: Inez Catalina M.D.   On: 06/17/2013 07:42   Dg Chest 2 View  06/02/2013   CLINICAL DATA:  Sickle cell crisis.  Shortness of breath.  EXAM: CHEST  2 VIEW  COMPARISON:  05/14/2013  FINDINGS: Left chest wall port a catheter is noted with tip in the cavoatrial junction. The heart size is enlarged. The lung volumes are low and there is scarring within both lung bases. No pleural effusions or edema. No airspace consolidation.  IMPRESSION: 1. Low lung volumes with bibasilar scarring.  . 2. Cardiac enlargement.   Electronically Signed   By: Kerby Moors M.D.   On: 06/02/2013 09:36   Ct Angio Chest Pe W/cm &/or Wo Cm  06/17/2013   CLINICAL DATA:  33 year old male with shortness of breath, hypoxia and sickle cell disease.  EXAM: CT ANGIOGRAPHY CHEST WITH CONTRAST  TECHNIQUE: Multidetector CT imaging of the chest was performed using the standard protocol during bolus administration of intravenous contrast. Multiplanar CT image reconstructions and MIPs were obtained to evaluate the vascular anatomy.  CONTRAST:  147mL OMNIPAQUE IOHEXOL 350 MG/ML SOLN  COMPARISON:  06/02/2013 and prior CTs  FINDINGS: This is a technically satisfactory study.  No pulmonary emboli are identified.  Cardiomegaly is again noted.  There is no evidence of thoracic aortic aneurysm or definite dissection.  Multiple shotty and mildly prominent mediastinal lymph nodes are unchanged.  A miniscule right pleural effusion is now noted.  A 1.4 x 1.9 cm right lower lobe peripheral area of focal consolidation is again noted.  A 0.8 x 1.8 cm inferior right upper lobe peripheral consolidation/airspace opacity is now identified.  He other faint areas of peripheral ground-glass opacity within the right middle lobe, lingula and right upper lobe are identified.  Mild bibasilar  atelectasis/scarring is present.  There is no evidence of pneumothorax.  A calcified auto infarcted infarcted spleen is present.  Bony  changes of sickle cell disease are noted including bilateral humeral head AVN.  A left-sided Port-A-Cath is present.  Review of the MIP images confirms the above findings.  IMPRESSION: No evidence of pulmonary emboli or thoracic aortic aneurysm.  Nonspecific peripheral areas of ground-glass/airspace opacity and focal consolidation as described above. This may represent pneumonia, atelectasis or acute chest syndrome changes. The right lower lobe area of focal consolidation is stable to minimally increased.  New miniscule right pleural effusion.  Cardiomegaly with splenic and bony changes of sickle cell disease.   Electronically Signed   By: Hassan Rowan M.D.   On: 06/17/2013 19:09   Ct Angio Chest Pe W/cm &/or Wo Cm  06/02/2013   CLINICAL DATA:  Shortness of breath, chest pain, hypoxia, tachycardia, history of sickle cell anemia, evaluate for pulmonary embolism  EXAM: CT ANGIOGRAPHY CHEST WITH CONTRAST  TECHNIQUE: Multidetector CT imaging of the chest was performed using the standard protocol during bolus administration of intravenous contrast. Multiplanar CT image reconstructions and MIPs were obtained to evaluate the vascular anatomy.  CONTRAST:  170mL OMNIPAQUE IOHEXOL 350 MG/ML SOLN  COMPARISON:  CT ANGIO CHEST W/CM &/OR WO/CM dated 05/18/2013; CT ANGIO CHEST W/CM &/OR WO/CM dated 03/26/2013; CT ANGIO CHEST W/CM &/OR WO/CM dated 11/13/2012; CT ANGIO CHEST W/CM &/OR WO/CM dated 08/27/2012  FINDINGS: Vascular Findings:  There is suboptimal adequate opacification of the pulmonary arterial system with the main pulmonary artery measuring only 187 Hounsfield units. Given this limitation, there are no discrete filling defects within the pulmonary arterial tree to the level of the bilateral subsegmental pulmonary arteries. Evaluation of distal subsegmental pulmonary arteries is degraded  secondary to suboptimal vessel opacification and patient respiratory artifact. There is grossly unchanged marked enlargement of the main pulmonary artery measuring approximately 38 mm in diameter.  Cardiomegaly. No pericardial effusion. Normal caliber of the thoracic aorta. A Bentson configuration of the aortic arch. Branch vessels of the aortic arch widely patent throughout their imaged course. No definite thoracic aortic dissection or periaortic stranding.  There is a left anterior chest wall subclavian vein approach port a catheter with tip terminating within the superior aspect of the right atrium.  Review of the MIP images confirms the above findings.   ----------------------------------------------------------------------------------  Nonvascular Findings:  Evaluation of the pulmonary parenchyma is degraded secondary to patient respiratory artifact.  Interval development / worsening of a ill-defined potentially partially cavitary approximately 2.0 x 1.4 cm opacity within the peripheral aspect of the superior segment of the right lower lobe (image 67, series 9). There is an additional ill-defined area of ground-glass within the subpleural aspect of the right lower lobe (image 65, series 9).  There is grossly symmetric subpleural dependent subsegmental atelectasis within the bilateral lower lobes and caudal segment of the lingula. No pleural effusion or pneumothorax. The central pulmonary airways appear widely patent. Shotty prevascular lymph nodes are individually not enlarged by size criteria with index prevascular lymph node measuring approximately 0.9 cm in greatest short axis diameter (image 46, series 6). No mediastinal, hilar or axillary lymphadenopathy.  Early arterial phase evaluation of the upper abdomen is normal.  No definite acute or aggressive osseous abnormalities. Accentuated kyphosis within in the superior aspect of the thoracic spine, likely positional. Stigmata of sickle cell disease affecting  the imaged osseous structures of the chest including mild diffuse increased slightly mottled sclerosis and fish-mouth deformities about multiple intervertebral disc spaces within in the mid cranial and mid aspects of the thoracic spine.  Thyroid gland appears normal  given obliquity. Regional soft tissues appear normal.  IMPRESSION: 1. No evidence of pulmonary embolism to the level of the bilateral subsegmental pulmonary arteries. 2. Interval development of airspace opacities within the right lower and to a lesser extent, the right middle lobes, worrisome for developing areas of infection and/or acute chest syndrome in this patient with known sickle cell disease. 3. Cardiomegaly with enlargement of the caliber of the main pulmonary artery, nonspecific though could be seen in the setting of pulmonary arterial hypertension. Further evaluation could be performed with cardiac echo as clinically indicated.   Electronically Signed   By: Sandi Mariscal M.D.   On: 06/02/2013 12:02   Dg Chest Port 1 View  06/18/2013   CLINICAL DATA:  33 year old male with sickle cell anemia. Hypoxemia. Initial encounter.  EXAM: PORTABLE CHEST - 1 VIEW  COMPARISON:  Chest CTA 06/17/2013 and earlier.  FINDINGS: Portable AP semi upright view at 0510 hrs. Left chest Port-A-Cath re-identified, now accessed. Mildly lower lung volumes. Stable cardiomegaly and mediastinal contours. Visualized tracheal air column is within normal limits. No pneumothorax. No pulmonary edema. Mild peripheral nodular pulmonary opacity, most conspicuous in the right lung, is stable. Overall ventilation not significantly changed.  IMPRESSION: Stable peripheral nodular pulmonary opacity, most conspicuous in the right lung, characterized by chest CTA yesterday.   Electronically Signed   By: Lars Pinks M.D.   On: 06/18/2013 07:13    Scheduled Meds: . aspirin  325 mg Oral Daily  . ceFEPime (MAXIPIME) IV  1 g Intravenous Q8H  . deferoxamine (DESFERAL) IV  2,000 mg  Intravenous QHS  . folic acid  1 mg Oral q morning - 10a  . HYDROmorphone PCA 0.3 mg/mL   Intravenous 6 times per day  . hydroxyurea  1,000 mg Oral Daily  . ketorolac  15 mg Intravenous 4 times per day  . morphine  15 mg Oral BID  . pantoprazole  40 mg Oral Daily   Or  . pantoprazole (PROTONIX) IV  40 mg Intravenous Daily  . potassium chloride SA  20 mEq Oral q morning - 10a  . vancomycin  1,250 mg Intravenous Q8H   Continuous Infusions: . dexrose 5 % and 0.45 % NaCl with KCl 30 mEq/L 75 mL/hr at 06/18/13 0808  . heparin      Total time CC- 1 hour [name]

## 2013-06-18 NOTE — Progress Notes (Signed)
BRIEF PHARMACY NOTE Re: Heparin Consult  53 yoM with sickle cell disease presented in crisis with chest pain and elevated troponins.  Patient on chronic Xarelto therapy PTA for history of PEs.  Pharmacy consulted 5/18 to start heparin.  Last dose of Xarelto given 5/18 evening so heparin infusion was planned to be delayed until noon today.  Cardiology rounded on patient this afternoon and is attributing elevated troponin to demand ischemia in setting of severe anemia and sickle crisis.  Cardiology (Dr. Stanford Breed) recommends continuing Xarelto.  Plan: Discontinue heparin per pharmacy orders. Resume Xarelto 20 mg daily.  Next dose due this evening. Pharmacy to sign off.  Hershal Coria, PharmD, BCPS Pager: [phone] 06/18/2013 1:31 PM

## 2013-06-18 NOTE — Consult Note (Signed)
Primary cardiologist:   HPI: 33 year old male for evaluation of elevated troponin. The patient has sickle cell anemia and has been admitted multiple times with sickle crises. He also has a history of recurrent pulmonary emboli treated with chronic anticoagulation. Admitted May 18 with a sickle crisis with back pain, rib pain and lower extremity pain. No chest pain or dyspnea. He has been treated with improvement. When he is not having sickle crises he denies exertional chest pain, orthopnea, PND or pedal edema. He has noticed over the past 2 years dyspnea with more extreme activities relieved with rest. Patient's troponin elevated and cardiology asked to evaluate.  Medications Prior to Admission  Medication Sig Dispense Refill  . folic acid (FOLVITE) 1 MG tablet Take 1 tablet (1 mg total) by mouth every morning.  30 tablet  11  . HYDROmorphone (DILAUDID) 4 MG tablet Take 1 tablet (4 mg total) by mouth every 4 (four) hours as needed for severe pain.  90 tablet  0  . hydroxyurea (HYDREA) 500 MG capsule Take 1,000 mg by mouth daily. May take with food to minimize GI side effects.      Marland Kitchen morphine (MS CONTIN) 15 MG 12 hr tablet Take 1 tablet (15 mg total) by mouth 2 (two) times daily.  60 tablet  0  . potassium chloride SA (K-DUR,KLOR-CON) 20 MEQ tablet Take 1 tablet (20 mEq total) by mouth every morning.  30 tablet  3  . Rivaroxaban (XARELTO) 20 MG TABS tablet Take 20 mg by mouth daily with supper.      . zolpidem (AMBIEN) 10 MG tablet Take 1 tablet (10 mg total) by mouth at bedtime as needed for sleep.  30 tablet  0    Allergies  Allergen Reactions  . Morphine And Related Hives and Rash    Pt states he can take PO, but not IV and he is able to tolerate Dilaudid with no reactions.    Past Medical History  Diagnosis Date  . Sickle cell anemia   . Blood transfusion   . 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 Hypokalemia   . Mood disorder   . History of pulmonary  embolus (PE)   . Avascular necrosis   . Leukocytosis     Chronic  . Thrombocytosis     Chronic  . Hypertension   . History of Clostridium difficile infection   . Uses marijuana   . Chronic anticoagulation   . Functional asplenia   . Former smoker   . Second hand tobacco smoke exposure   . Alcohol consumption of one to four drinks per day     Past Surgical History  Procedure Laterality Date  . Right hip replacement      08/2006  . Cholecystectomy      01/2008  . Porta cath placement    . Porta cath removal    . Umbilical hernia repair      01/2008  . Excision of left periauricular cyst      10/2009  . Excision of right ear lobe cyst with primary closur      11/2007  . Portacath placement  01/05/2012    Procedure: INSERTION PORT-A-CATH;  Surgeon: Odis Hollingshead, MD;  Location: [facility];  Service: General;  Laterality: N/A;  ultrasound guiced port a cath insertion with fluoroscopy    History   Social History  . Marital Status: Single    Spouse Name: N/A    Number  of Children: 0  . Years of Education: 13   Occupational History  . 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 Smoking status: Former Smoker -- 13 years    Types: Cigarettes    Quit date: 07/08/2010  . Smokeless tobacco: Never Used  . Alcohol Use: 3.6 oz/week    6 Shots of liquor per week  . Drug Use: 2.00 per week    Special: Marijuana  . Sexual Activity: Yes    Partners: Female    Pharmacist, hospital Protection: None   Other Topics Concern  . Not on file   Social History Narrative   Lives in an apartment.  Single.  Lives alone but has a girlfriend that helps care for him.  Does not use any assist devices.        [name]:  [phone] Mom, emergency contact    Family History  Problem Relation Age of Onset  . Sickle cell anemia Mother   . Sickle cell anemia Father   . Sickle cell trait Brother     ROS:  no fevers or chills, productive  cough, hemoptysis, dysphasia, odynophagia, melena, hematochezia, dysuria, hematuria, rash, seizure activity, orthopnea, PND, pedal edema, claudication. Remaining systems are negative.  Physical Exam:   Blood pressure 137/91, pulse 89, temperature 98.1 F (36.7 C), temperature source Oral, resp. rate 22, height 6' 0.05" (1.83 m), weight 167 lb 5.3 oz (75.9 kg), SpO2 97.00%.  General:  Well developed/well nourished in NAD Skin warm/dry, Tattoos Patient not depressed No peripheral clubbing Back-normal HEENT-normal/normal eyelids Neck supple/normal carotid upstroke bilaterally; no bruits; no JVD; no thyromegaly chest - CTA/ normal expansion CV - RRR/normal S1 and S2; no murmurs, rubs;  PMI nondisplaced, Positive S4 Abdomen -NT/ND, no HSM, no mass, + bowel sounds, no bruit 2+ femoral pulses, no bruits Ext-no edema, chords, 2+ DP Neuro-grossly nonfocal  ECG Sinus rhythm with anterior T-wave inversion, Left anterior fascicular block, prolonged QT.  Results for orders placed during the hospital encounter of 06/17/13 (from the past 48 hour(s))  CBC WITH DIFFERENTIAL     Status: Abnormal   Collection Time    06/17/13  8:00 AM      Result Value Ref Range   WBC 21.1 (*) 4.0 - 10.5 K/uL   RBC 2.27 (*) 4.22 - 5.81 MIL/uL   Hemoglobin 7.4 (*) 13.0 - 17.0 g/dL   Comment: REPEATED TO VERIFY   HCT 21.7 (*) 39.0 - 52.0 %   MCV 95.6  78.0 - 100.0 fL   MCH 32.6  26.0 - 34.0 pg   MCHC 34.1  30.0 - 36.0 g/dL   RDW 38.3 (*) 91.9 - 73.7 %   Platelets 480 (*) 150 - 400 K/uL   Neutrophils Relative % 75  43 - 77 %   Lymphocytes Relative 11 (*) 12 - 46 %   Monocytes Relative 10  3 - 12 %   Eosinophils Relative 3  0 - 5 %   Basophils Relative 1  0 - 1 %   Neutro Abs 15.9 (*) 1.7 - 7.7 K/uL   Lymphs Abs 2.3  0.7 - 4.0 K/uL   Monocytes Absolute 2.1 (*) 0.1 - 1.0 K/uL   Eosinophils Absolute 0.6  0.0 - 0.7 K/uL   Basophils Absolute 0.2 (*) 0.0 - 0.1 K/uL   RBC Morphology SICKLE CELLS     Comment:  POLYCHROMASIA PRESENT     TARGET CELLS  RARE NRBCs   Smear Review LARGE PLATELETS PRESENT    COMPREHENSIVE METABOLIC PANEL     Status: Abnormal   Collection Time    06/17/13  8:00 AM      Result Value Ref Range   Sodium 140  137 - 147 mEq/L   Potassium 3.5 (*) 3.7 - 5.3 mEq/L   Chloride 106  96 - 112 mEq/L   CO2 23  19 - 32 mEq/L   Glucose, Bld 96  70 - 99 mg/dL   BUN 6  6 - 23 mg/dL   Creatinine, Ser 0.60  0.50 - 1.35 mg/dL   Calcium 8.9  8.4 - 10.5 mg/dL   Total Protein 6.7  6.0 - 8.3 g/dL   Albumin 3.6  3.5 - 5.2 g/dL   AST 42 (*) 0 - 37 U/L   ALT 26  0 - 53 U/L   Alkaline Phosphatase 103  39 - 117 U/L   Total Bilirubin 5.4 (*) 0.3 - 1.2 mg/dL   GFR calc non Af Amer >90  >90 mL/min   GFR calc Af Amer >90  >90 mL/min   Comment: (NOTE)     The eGFR has been calculated using the CKD EPI equation.     This calculation has not been validated in all clinical situations.     eGFR's persistently <90 mL/min signify possible Chronic Kidney     Disease.  RETICULOCYTES     Status: Abnormal   Collection Time    06/17/13  8:00 AM      Result Value Ref Range   Retic Ct Pct 18.6 (*) 0.4 - 3.1 %   Comment: RESULTS CONFIRMED BY MANUAL DILUTION   RBC. 2.27 (*) 4.22 - 5.81 MIL/uL   Retic Count, Manual 422.2 (*) 19.0 - 186.0 K/uL  LACTATE DEHYDROGENASE     Status: Abnormal   Collection Time    06/17/13 11:23 AM      Result Value Ref Range   LDH 434 (*) 94 - 250 U/L  MAGNESIUM     Status: None   Collection Time    06/17/13 11:23 AM      Result Value Ref Range   Magnesium 1.5  1.5 - 2.5 mg/dL  HIV ANTIBODY (ROUTINE TESTING)     Status: None   Collection Time    06/17/13 11:23 AM      Result Value Ref Range   HIV 1&2 Ab, 4th Generation NONREACTIVE  NONREACTIVE   Comment: (NOTE)     A NONREACTIVE HIV Ag/Ab result does not exclude HIV infection since     the time frame for seroconversion is variable. If acute HIV infection     is suspected, a HIV-1 RNA Qualitative TMA test is  recommended.     HIV-1/2 Antibody Diff         Not indicated.     HIV-1 RNA, Qual TMA           Not indicated.     PLEASE NOTE: This information has been disclosed to you from records     whose confidentiality may be protected by state law. If your state     requires such protection, then the state law prohibits you from making     any further disclosure of the information without the specific written     consent of the person to whom it pertains, or as otherwise permitted     by law. A general authorization for the release of medical or other  information is NOT sufficient for this purpose.     The performance of this assay has not been clinically validated in     patients less than 22 years old.     Performed at [place], [place] MICROSCOPIC     Status: None   Collection Time    06/17/13  2:04 PM      Result Value Ref Range   Color, Urine YELLOW  YELLOW   APPearance CLEAR  CLEAR   Specific Gravity, Urine 1.011  1.005 - 1.030   pH 7.0  5.0 - 8.0   Glucose, UA NEGATIVE  NEGATIVE mg/dL   Hgb urine dipstick NEGATIVE  NEGATIVE   Bilirubin Urine NEGATIVE  NEGATIVE   Ketones, ur NEGATIVE  NEGATIVE mg/dL   Protein, ur NEGATIVE  NEGATIVE mg/dL   Urobilinogen, UA 1.0  0.0 - 1.0 mg/dL   Nitrite NEGATIVE  NEGATIVE   Leukocytes, UA NEGATIVE  NEGATIVE   Comment: MICROSCOPIC NOT DONE ON URINES WITH NEGATIVE PROTEIN, BLOOD, LEUKOCYTES, NITRITE, OR GLUCOSE <1000 mg/dL.  BLOOD GAS, ARTERIAL     Status: Abnormal   Collection Time    06/17/13  4:37 PM      Result Value Ref Range   FIO2 0.21     Delivery systems ROOM AIR     pH, Arterial 7.367  7.350 - 7.450   pCO2 arterial 41.5  35.0 - 45.0 mmHg   pO2, Arterial 45.6 (*) 80.0 - 100.0 mmHg   Bicarbonate 23.3  20.0 - 24.0 mEq/L   TCO2 22.6  0 - 100 mmol/L   Acid-base deficit 1.4  0.0 - 2.0 mmol/L   O2 Saturation 69.5     Patient temperature 98.6     Collection site RIGHT RADIAL     Drawn by [phone]      Sample type ARTERIAL DRAW     Allens test (pass/fail) PASS  PASS  MRSA PCR SCREENING     Status: None   Collection Time    06/17/13  6:52 PM      Result Value Ref Range   MRSA by PCR NEGATIVE  NEGATIVE   Comment:            The GeneXpert MRSA Assay (FDA     approved for NASAL specimens     only), is one component of a     comprehensive MRSA colonization     surveillance program. It is not     intended to diagnose MRSA     infection nor to guide or     monitor treatment for     MRSA infections.  PREPARE RBC (CROSSMATCH)     Status: None   Collection Time    06/17/13  8:00 PM      Result Value Ref Range   Order Confirmation ORDER PROCESSED BY BLOOD BANK    TROPONIN I     Status: Abnormal   Collection Time    06/17/13  8:45 PM      Result Value Ref Range   Troponin I 1.61 (*) <0.30 ng/mL   Comment:            Due to the release kinetics of cTnI,     a negative result within the first hours     of the onset of symptoms does not rule out     myocardial infarction with certainty.     If myocardial infarction is still suspected,     repeat the test  at appropriate intervals.     CRITICAL RESULT CALLED TO, READ BACK BY AND VERIFIED WITH:     DAVIS,B/2W $[email or web address]'@[email or web address]$  ON 06/17/13 BY KARCZEWSKI,S.  TYPE AND SCREEN     Status: None   Collection Time    06/17/13 11:00 PM      Result Value Ref Range   ABO/RH(D) O POS     Antibody Screen NEG     Sample Expiration 06/20/2013     Unit Number [id number]     Blood Component Type RED CELLS,LR     Unit division 00     Status of Unit ALLOCATED     Donor AG Type       Value: NEGATIVE FOR C ANTIGEN NEGATIVE FOR E ANTIGEN NEGATIVE FOR KELL ANTIGEN   Transfusion Status OK TO TRANSFUSE     Crossmatch Result COMPATIBLE     Unit Number [id number]     Blood Component Type RED CELLS,LR     Unit division 00     Status of Unit ISSUED     Donor AG Type       Value: NEGATIVE FOR C ANTIGEN NEGATIVE FOR E ANTIGEN NEGATIVE FOR KELL ANTIGEN    Transfusion Status OK TO TRANSFUSE     Crossmatch Result COMPATIBLE    COMPREHENSIVE METABOLIC PANEL     Status: Abnormal   Collection Time    06/18/13  5:35 AM      Result Value Ref Range   Sodium 137  137 - 147 mEq/L   Potassium 3.6 (*) 3.7 - 5.3 mEq/L   Chloride 102  96 - 112 mEq/L   CO2 24  19 - 32 mEq/L   Glucose, Bld 89  70 - 99 mg/dL   BUN 4 (*) 6 - 23 mg/dL   Creatinine, Ser 0.54  0.50 - 1.35 mg/dL   Calcium 8.6  8.4 - 10.5 mg/dL   Total Protein 6.8  6.0 - 8.3 g/dL   Albumin 3.5  3.5 - 5.2 g/dL   AST 33  0 - 37 U/L   ALT 22  0 - 53 U/L   Alkaline Phosphatase 79  39 - 117 U/L   Total Bilirubin 4.1 (*) 0.3 - 1.2 mg/dL   GFR calc non Af Amer >90  >90 mL/min   GFR calc Af Amer >90  >90 mL/min   Comment: (NOTE)     The eGFR has been calculated using the CKD EPI equation.     This calculation has not been validated in all clinical situations.     eGFR's persistently <90 mL/min signify possible Chronic Kidney     Disease.  CBC WITH DIFFERENTIAL     Status: Abnormal   Collection Time    06/18/13  5:35 AM      Result Value Ref Range   WBC 21.1 (*) 4.0 - 10.5 K/uL   RBC 2.21 (*) 4.22 - 5.81 MIL/uL   Hemoglobin 7.1 (*) 13.0 - 17.0 g/dL   HCT 21.3 (*) 39.0 - 52.0 %   MCV 96.4  78.0 - 100.0 fL   MCH 32.1  26.0 - 34.0 pg   MCHC 33.3  30.0 - 36.0 g/dL   RDW 22.0 (*) 11.5 - 15.5 %   Platelets 427 (*) 150 - 400 K/uL   Neutrophils Relative % 62  43 - 77 %   Lymphocytes Relative 23  12 - 46 %   Monocytes Relative 9  3 - 12 %   Eosinophils Relative 4  0 -  5 %   Basophils Relative 0  0 - 1 %   Metamyelocytes Relative 1     Myelocytes 1     Neutro Abs 13.5 (*) 1.7 - 7.7 K/uL   Lymphs Abs 4.9 (*) 0.7 - 4.0 K/uL   Monocytes Absolute 1.9 (*) 0.1 - 1.0 K/uL   Eosinophils Absolute 0.8 (*) 0.0 - 0.7 K/uL   Basophils Absolute 0.0  0.0 - 0.1 K/uL   RBC Morphology TARGET CELLS     Comment: BASOPHILIC STIPPLING     HOWELL/JOLLY BODIES     SICKLE CELLS     POLYCHROMASIA PRESENT   WBC  Morphology VACUOLATED NEUTROPHILS     Comment: MILD LEFT SHIFT (1-5% METAS, OCC MYELO, OCC BANDS)  LACTATE DEHYDROGENASE     Status: Abnormal   Collection Time    06/18/13  5:35 AM      Result Value Ref Range   LDH 409 (*) 94 - 250 U/L  TROPONIN I     Status: Abnormal   Collection Time    06/18/13  5:35 AM      Result Value Ref Range   Troponin I 0.89 (*) <0.30 ng/mL   Comment:            Due to the release kinetics of cTnI,     a negative result within the first hours     of the onset of symptoms does not rule out     myocardial infarction with certainty.     If myocardial infarction is still suspected,     repeat the test at appropriate intervals.     CRITICAL VALUE NOTED.  VALUE IS CONSISTENT WITH PREVIOUSLY REPORTED AND CALLED VALUE.  TROPONIN I     Status: Abnormal   Collection Time    06/18/13  9:05 AM      Result Value Ref Range   Troponin I 0.58 (*) <0.30 ng/mL   Comment:            Due to the release kinetics of cTnI,     a negative result within the first hours     of the onset of symptoms does not rule out     myocardial infarction with certainty.     If myocardial infarction is still suspected,     repeat the test at appropriate intervals.     CRITICAL VALUE NOTED.  VALUE IS CONSISTENT WITH PREVIOUSLY REPORTED AND CALLED VALUE.  APTT     Status: Abnormal   Collection Time    06/18/13  9:05 AM      Result Value Ref Range   aPTT 42 (*) 24 - 37 seconds   Comment:            IF BASELINE aPTT IS ELEVATED,     SUGGEST PATIENT RISK ASSESSMENT     BE USED TO DETERMINE APPROPRIATE     ANTICOAGULANT THERAPY.  MAGNESIUM     Status: None   Collection Time    06/18/13  9:05 AM      Result Value Ref Range   Magnesium 1.5  1.5 - 2.5 mg/dL    Dg Chest 2 View  06/17/2013   CLINICAL DATA:  Pain, sickle cell crisis  EXAM: CHEST  2 VIEW  COMPARISON:  06/02/2013  FINDINGS: Cardiac shadow is stable. A left chest wall port is again seen in satisfactory position. Patchy nodular  changes are noted particularly in the right lung base similar to that seen on recent CT examination.  No new focal infiltrate or sizable effusion is noted.  IMPRESSION: Stable right basilar parenchymal nodules.   Electronically Signed   By: Inez Catalina M.D.   On: 06/17/2013 07:42   Ct Angio Chest Pe W/cm &/or Wo Cm  06/17/2013   CLINICAL DATA:  33 year old male with shortness of breath, hypoxia and sickle cell disease.  EXAM: CT ANGIOGRAPHY CHEST WITH CONTRAST  TECHNIQUE: Multidetector CT imaging of the chest was performed using the standard protocol during bolus administration of intravenous contrast. Multiplanar CT image reconstructions and MIPs were obtained to evaluate the vascular anatomy.  CONTRAST:  172mL OMNIPAQUE IOHEXOL 350 MG/ML SOLN  COMPARISON:  06/02/2013 and prior CTs  FINDINGS: This is a technically satisfactory study.  No pulmonary emboli are identified.  Cardiomegaly is again noted.  There is no evidence of thoracic aortic aneurysm or definite dissection.  Multiple shotty and mildly prominent mediastinal lymph nodes are unchanged.  A miniscule right pleural effusion is now noted.  A 1.4 x 1.9 cm right lower lobe peripheral area of focal consolidation is again noted.  A 0.8 x 1.8 cm inferior right upper lobe peripheral consolidation/airspace opacity is now identified.  He other faint areas of peripheral ground-glass opacity within the right middle lobe, lingula and right upper lobe are identified.  Mild bibasilar atelectasis/scarring is present.  There is no evidence of pneumothorax.  A calcified auto infarcted infarcted spleen is present.  Bony changes of sickle cell disease are noted including bilateral humeral head AVN.  A left-sided Port-A-Cath is present.  Review of the MIP images confirms the above findings.  IMPRESSION: No evidence of pulmonary emboli or thoracic aortic aneurysm.  Nonspecific peripheral areas of ground-glass/airspace opacity and focal consolidation as described above. This  may represent pneumonia, atelectasis or acute chest syndrome changes. The right lower lobe area of focal consolidation is stable to minimally increased.  New miniscule right pleural effusion.  Cardiomegaly with splenic and bony changes of sickle cell disease.   Electronically Signed   By: Hassan Rowan M.D.   On: 06/17/2013 19:09   Dg Chest Port 1 View  06/18/2013   CLINICAL DATA:  33 year old male with sickle cell anemia. Hypoxemia. Initial encounter.  EXAM: PORTABLE CHEST - 1 VIEW  COMPARISON:  Chest CTA 06/17/2013 and earlier.  FINDINGS: Portable AP semi upright view at 0510 hrs. Left chest Port-A-Cath re-identified, now accessed. Mildly lower lung volumes. Stable cardiomegaly and mediastinal contours. Visualized tracheal air column is within normal limits. No pneumothorax. No pulmonary edema. Mild peripheral nodular pulmonary opacity, most conspicuous in the right lung, is stable. Overall ventilation not significantly changed.  IMPRESSION: Stable peripheral nodular pulmonary opacity, most conspicuous in the right lung, characterized by chest CTA yesterday.   Electronically Signed   By: Lars Pinks M.D.   On: 06/18/2013 07:13    Assessment/Plan 1 elevated troponin-patient stated he did not have chest pain on arrival. Troponin most likely related to demand ischemia in the setting of severe anemia and sickle crises. His electrocardiogram shows anterior T-wave inversion but this is not new. This may be related to RV strain from pulmonary hypertension. Echocardiogram shows normal LV function and wall motion. Will arrange outpatient stress echocardiogram once he recovers from his acute sickle crises. I think epicardial coronary disease is unlikely. 2 pulmonary hypertension-patient's TR velocity is 4 m/s on his echocardiogram consistent with severe pulmonary hypertension. This may be related to prior pulmonary emboli but could also be related to his sickle cell disease. He does note  dyspnea on exertion for 2 years.  Would continue xeralto. Will check VQ scan to rule out chronic thromboemboli. Will arrange outpatient overnight oximetry and if desaturation documented he would benefit from oxygen. I would also arrange an outpatient evaluation by Dr. Haroldine Laws in the pulmonary hypertension clinic. He would most likely need right heart catheterization in the future and possibly vasodilator therapy. 3 sickle cell disease-patient is followed in sickle cell clinic; management per primary care. Please arrange appointment in pulmonary hypertension clinic at [facility]. Kirk Ruths MD 06/18/2013, 12:39 PM

## 2013-06-19 ENCOUNTER — Inpatient Hospital Stay (HOSPITAL_COMMUNITY): Payer: Medicare Other

## 2013-06-19 LAB — CBC WITH DIFFERENTIAL/PLATELET
Band Neutrophils: 0 % (ref 0–10)
Basophils Absolute: 0.2 10*3/uL — ABNORMAL HIGH (ref 0.0–0.1)
Basophils Relative: 1 % (ref 0–1)
Blasts: 0 %
EOS ABS: 0.8 10*3/uL — AB (ref 0.0–0.7)
EOS PCT: 4 % (ref 0–5)
HCT: 26.6 % — ABNORMAL LOW (ref 39.0–52.0)
Hemoglobin: 8.9 g/dL — ABNORMAL LOW (ref 13.0–17.0)
Lymphocytes Relative: 14 % (ref 12–46)
Lymphs Abs: 2.9 10*3/uL (ref 0.7–4.0)
MCH: 31.4 pg (ref 26.0–34.0)
MCHC: 33.5 g/dL (ref 30.0–36.0)
MCV: 94 fL (ref 78.0–100.0)
METAMYELOCYTES PCT: 0 %
Monocytes Absolute: 1.4 10*3/uL — ABNORMAL HIGH (ref 0.1–1.0)
Monocytes Relative: 7 % (ref 3–12)
Myelocytes: 0 %
NEUTROS ABS: 15.2 10*3/uL — AB (ref 1.7–7.7)
NEUTROS PCT: 74 % (ref 43–77)
NRBC: 0 /100{WBCs}
PROMYELOCYTES ABS: 0 %
Platelets: 420 10*3/uL — ABNORMAL HIGH (ref 150–400)
RBC: 2.83 MIL/uL — AB (ref 4.22–5.81)
RDW: 19.9 % — AB (ref 11.5–15.5)
WBC: 20.5 10*3/uL — ABNORMAL HIGH (ref 4.0–10.5)

## 2013-06-19 LAB — COMPREHENSIVE METABOLIC PANEL
ALBUMIN: 3.7 g/dL (ref 3.5–5.2)
ALT: 23 U/L (ref 0–53)
AST: 33 U/L (ref 0–37)
Alkaline Phosphatase: 95 U/L (ref 39–117)
BUN: 7 mg/dL (ref 6–23)
CHLORIDE: 103 meq/L (ref 96–112)
CO2: 22 mEq/L (ref 19–32)
Calcium: 8.9 mg/dL (ref 8.4–10.5)
Creatinine, Ser: 0.54 mg/dL (ref 0.50–1.35)
GFR calc Af Amer: >90 mL/min (ref >90)
GFR calc non Af Amer: >90 mL/min (ref >90)
Glucose, Bld: 102 mg/dL — ABNORMAL HIGH (ref 70–99)
POTASSIUM: 4.4 meq/L (ref 3.7–5.3)
SODIUM: 137 meq/L (ref 137–147)
TOTAL PROTEIN: 7 g/dL (ref 6.0–8.3)
Total Bilirubin: 4.7 mg/dL — ABNORMAL HIGH (ref 0.3–1.2)

## 2013-06-19 LAB — TYPE AND SCREEN
ABO/RH(D): O POS
Antibody Screen: NEGATIVE
Unit division: 0
Unit division: 0

## 2013-06-19 LAB — HEMOGLOBINOPATHY EVALUATION
HEMOGLOBIN OTHER: 0 %
HGB A2 QUANT: 3.4 % — AB (ref 2.2–3.2)
HGB A: 13 % — AB (ref 96.8–97.8)
Hgb F Quant: 6.9 % — ABNORMAL HIGH (ref 0.0–2.0)
Hgb S Quant: 76.7 % — ABNORMAL HIGH

## 2013-06-19 LAB — LACTATE DEHYDROGENASE: LDH: 446 U/L — AB (ref 94–250)

## 2013-06-19 MED ORDER — LEVOFLOXACIN 750 MG PO TABS
750.0000 mg | ORAL_TABLET | Freq: Every day | ORAL | Status: DC
  Administered 2013-06-19 – 2013-06-20 (×2): 750 mg via ORAL
  Filled 2013-06-19 (×2): qty 1

## 2013-06-19 MED ORDER — TECHNETIUM TO 99M ALBUMIN AGGREGATED
5.3000 | Freq: Once | INTRAVENOUS | Status: AC | PRN
  Administered 2013-06-19: 5 via INTRAVENOUS

## 2013-06-19 MED ORDER — TECHNETIUM TC 99M DIETHYLENETRIAME-PENTAACETIC ACID: 43.9000 | Freq: Once | INTRAVENOUS | Status: AC | PRN

## 2013-06-19 NOTE — Progress Notes (Signed)
SICKLE CELL SERVICE PROGRESS NOTE  [name] ZJI:[id number] DOB: 02-16-1980 DOA: 06/17/2013 PCP: [place] Huberty A., MD  Assessment/Plan: Principal Problem:   Acute chest syndrome Active Problems:   NSTEMI (non-ST elevated myocardial infarction)   Sickle cell crisis   Hx of pulmonary embolus   Leukocytosis, unspecified   Hypokalemia   Bilateral flank pain   Sickle cell anemia   Hypoxemia   Pulmonary HTN  1. Acute Chest Syndrome: Pt is s/p transfusion of 2 units PRBC with Hb nor at 8.9. He has improved oxygenation with RA saturation at 94%. Will continue to supplement Oxygen until stable on RA. Will also evaluate ambulatory saturations on RA. 2. Elevated troponin: Pt has normal renal function however in the context of inverted T-waves with elevated troponin this is likely not a NSTEMI but rather demand ischemis. Continue ASA. Start Beta-blocker. [facility] input. 3. Pneumonia vs atelectasis: CT scan shows consolidation and in light of Acute Chest Syndrome, I agree with empiric antibiotics. Antibiotics changed to oral route. 4. HTN: BP improved with Lisinopril and metoprolol. Continue. 5.  Hypokalemia: Replaced orally. Continue oral supplementation. 6. Hypomagnesemia: replaced by IV. 7. Leukocytosis: Likely a combination of infectious and inflammatory etiology. Continue antibiotics and Toradol. Slowly improving. 8. Hb SS with crisis: Pain improved. Pt has used only 39 mg and had 54 attempts and 53 deliveries on PCA. Will schedule oral medications and continue PCA as PRN medication.  Continue Hydrea and folic acid. Anticipate D/C home tomorrow. 9. H/O PE: Pt on chronic anticoagulation with Xarelto . Continue. 10. ETOH use: Pt has recently (within the last year) started binging on the weekends with friends. This is likely a significant contributor to his chronic state of health. He has been counseled against further use.   Code Status: Full Code Family Communication:  N/A Disposition Plan: Anticipate discharge home tomorrow.  [name]  Pager [phone]. If 7PM-7AM, please contact night-coverage.  06/19/2013, 12:31 PM  LOS: 2 days   Brief narrative: [name] is a 33 y.o. male with sickle cell anemia and history of pulmonary embolus on chronic anticoagulation who was seen earlier today in the emergency department and then subsequently transferred for extended observation in the sickle cell center. The patient was noted to be hypoxic requiring at least 3 L of oxygen. He was noted to have significant desaturation to 85-88% oxygen saturation levels on room air when ambulating. The patient reported that he has missed several doses of his Xarelto medication. The patient reports that over the past 2 weeks he has missed approximately 3 doses of the medication. Given his history of pulmonary embolus, with most recent episode occurring after he had missed a dose of Xarelto and his ABG reveals a PO2 of 45 hospitalization was requested for further evaluation and management. The patient reported that he only has occasional shortness of breath. He denies having chest pain. He did have a recent NSTEMI during a prior hospitalization.  In terms of his sickle cell crisis he reported that he's been having one week of symptoms consistent with anterior chest wall and rib cage pain and bilateral flank pain in addition to pain in the legs. He reports that he was treated in the extended observation unit of the sickle cell center and the pain improved but later returned. He reports that it has been cyclic over the past week. He was seen in the emergency department 2 days ago and subsequently returned early this morning.    Consultants:  Cardiology  Procedures:  None  Antibiotics:  Cefepime 5/18>> 5/20  Vancomycin 5/18 >> 5/20  Levaquin 5/20 >>  HPI/Subjective: Pt states that he has pain in B/L ribs and posterior chest wall has improved.  He is asking about  going home as he feels better. He states that he has been mostly compliant with Hydrea but has missed some of his doses. He has also missed some of his Xarelto doses usually when he works odd hours. Objective: Filed Vitals:   06/19/13 0852 06/19/13 1012 06/19/13 1047 06/19/13 1133  BP:  134/87    Pulse:  99    Temp:  98.7 F (37.1 C)    TempSrc:  Oral    Resp: 18 19  16   Height:      Weight:      SpO2:  96% 94% 94%   Weight change: 3 lb 3.4 oz (1.457 kg)  Intake/Output Summary (Last 24 hours) at 06/19/13 1231 Last data filed at 06/19/13 1044  Gross per 24 hour  Intake 1606.67 ml  Output   2595 ml  Net -988.33 ml    General: Alert, awake, oriented x3, in no acute distress. Well appearing. HEENT: [place]/AT PEERL, EOMI, mild icterus Heart: Regular rate and rhythm, without murmurs, rubs, gallops.  Lungs: Clear to auscultation, no wheezing or rhonchi noted.  Abdomen: Soft, nontender, nondistended, positive bowel sounds, no masses no hepatosplenomegaly noted.  Neuro: No focal neurological deficits noted cranial nerves II through XII grossly intact. Strength normal in bilateral upper and lower extremities. Musculoskeletal: No warm swelling or erythema around joints, no spinal tenderness noted. Psychiatric: Patient alert and oriented x3, good insight and cognition, good recent to remote recall.    Data Reviewed: Basic Metabolic Panel:  Recent Labs Lab 06/14/13 0454 06/16/13 1308 06/17/13 0800 06/17/13 1123 06/18/13 0535 06/18/13 0905 06/19/13 0340  NA 139 140 140  --  137  --  137  K 3.6* 4.2 3.5*  --  3.6*  --  4.4  CL 103 106 106  --  102  --  103  CO2 23 25 23   --  24  --  22  GLUCOSE 100* 97 96  --  89  --  102*  BUN 4* 6 6  --  4*  --  7  CREATININE 0.69 0.55 0.60  --  0.54  --  0.54  CALCIUM 8.7 8.9 8.9  --  8.6  --  8.9  MG  --   --   --  1.5  --  1.5  --    Liver Function Tests:  Recent Labs Lab 06/16/13 1308 06/17/13 0800 06/18/13 0535 06/19/13 0340  AST  45* 42* 33 33  ALT 29 26 22 23   ALKPHOS 98 103 79 95  BILITOT 5.9* 5.4* 4.1* 4.7*  PROT 7.2 6.7 6.8 7.0  ALBUMIN 3.9 3.6 3.5 3.7   No results found for this basename: LIPASE, AMYLASE,  in the last 168 hours No results found for this basename: AMMONIA,  in the last 168 hours CBC:  Recent Labs Lab 06/14/13 0454 06/16/13 1308 06/17/13 0800 06/18/13 0535 06/19/13 0340  WBC 16.8* 23.5* 21.1* 21.1* 20.5*  NEUTROABS  --  16.9* 15.9* 13.5* 15.2*  HGB 7.6* 6.8* 7.4* 7.1* 8.9*  HCT 22.0* 20.5* 21.7* 21.3* 26.6*  MCV 96.1 95.8 95.6 96.4 94.0  PLT 553* 542* 480* 427* 420*   Cardiac Enzymes:  Recent Labs Lab 06/17/13 2045 06/18/13 0535 06/18/13 0905  TROPONINI 1.61* 0.89* 0.58*  BNP (last 3 results)  Recent Labs  11/13/12 1826  PROBNP 106.3   CBG: No results found for this basename: GLUCAP,  in the last 168 hours  Recent Results (from the past 240 hour(s))  MRSA PCR SCREENING     Status: None   Collection Time    06/17/13  6:52 PM      Result Value Ref Range Status   MRSA by PCR NEGATIVE  NEGATIVE Final   Comment:            The GeneXpert MRSA Assay (FDA     approved for NASAL specimens     only), is one component of a     comprehensive MRSA colonization     surveillance program. It is not     intended to diagnose MRSA     infection nor to guide or     monitor treatment for     MRSA infections.     Studies: Dg Chest 2 View  06/17/2013   CLINICAL DATA:  Pain, sickle cell crisis  EXAM: CHEST  2 VIEW  COMPARISON:  06/02/2013  FINDINGS: Cardiac shadow is stable. A left chest wall port is again seen in satisfactory position. Patchy nodular changes are noted particularly in the right lung base similar to that seen on recent CT examination. No new focal infiltrate or sizable effusion is noted.  IMPRESSION: Stable right basilar parenchymal nodules.   Electronically Signed   By: Inez Catalina M.D.   On: 06/17/2013 07:42    Ct Angio Chest Pe W/cm &/or Wo Cm  06/17/2013    CLINICAL DATA:  33 year old male with shortness of breath, hypoxia and sickle cell disease.  EXAM: CT ANGIOGRAPHY CHEST WITH CONTRAST  TECHNIQUE: Multidetector CT imaging of the chest was performed using the standard protocol during bolus administration of intravenous contrast. Multiplanar CT image reconstructions and MIPs were obtained to evaluate the vascular anatomy.  CONTRAST:  167mL OMNIPAQUE IOHEXOL 350 MG/ML SOLN  COMPARISON:  06/02/2013 and prior CTs  FINDINGS: This is a technically satisfactory study.  No pulmonary emboli are identified.  Cardiomegaly is again noted.  There is no evidence of thoracic aortic aneurysm or definite dissection.  Multiple shotty and mildly prominent mediastinal lymph nodes are unchanged.  A miniscule right pleural effusion is now noted.  A 1.4 x 1.9 cm right lower lobe peripheral area of focal consolidation is again noted.  A 0.8 x 1.8 cm inferior right upper lobe peripheral consolidation/airspace opacity is now identified.  He other faint areas of peripheral ground-glass opacity within the right middle lobe, lingula and right upper lobe are identified.  Mild bibasilar atelectasis/scarring is present.  There is no evidence of pneumothorax.  A calcified auto infarcted infarcted spleen is present.  Bony changes of sickle cell disease are noted including bilateral humeral head AVN.  A left-sided Port-A-Cath is present.  Review of the MIP images confirms the above findings.  IMPRESSION: No evidence of pulmonary emboli or thoracic aortic aneurysm.  Nonspecific peripheral areas of ground-glass/airspace opacity and focal consolidation as described above. This may represent pneumonia, atelectasis or acute chest syndrome changes. The right lower lobe area of focal consolidation is stable to minimally increased.  New miniscule right pleural effusion.  Cardiomegaly with splenic and bony changes of sickle cell disease.   Electronically Signed   By: Hassan Rowan M.D.   On: 06/17/2013 19:09     Dg Chest Port 1 View  06/18/2013   CLINICAL DATA:  33 year old male with  sickle cell anemia. Hypoxemia. Initial encounter.  EXAM: PORTABLE CHEST - 1 VIEW  COMPARISON:  Chest CTA 06/17/2013 and earlier.  FINDINGS: Portable AP semi upright view at 0510 hrs. Left chest Port-A-Cath re-identified, now accessed. Mildly lower lung volumes. Stable cardiomegaly and mediastinal contours. Visualized tracheal air column is within normal limits. No pneumothorax. No pulmonary edema. Mild peripheral nodular pulmonary opacity, most conspicuous in the right lung, is stable. Overall ventilation not significantly changed.  IMPRESSION: Stable peripheral nodular pulmonary opacity, most conspicuous in the right lung, characterized by chest CTA yesterday.   Electronically Signed   By: Lars Pinks M.D.   On: 06/18/2013 07:13    Scheduled Meds: . aspirin  325 mg Oral Daily  . deferoxamine (DESFERAL) IV  2,000 mg Intravenous QHS  . folic acid  1 mg Oral q morning - 10a  . HYDROmorphone PCA 0.3 mg/mL   Intravenous 6 times per day  . hydroxyurea  1,000 mg Oral Daily  . ketorolac  15 mg Intravenous 4 times per day  . levofloxacin  750 mg Oral Daily  . lisinopril  10 mg Oral Daily  . metoprolol tartrate  25 mg Oral BID  . morphine  15 mg Oral BID  . pantoprazole  40 mg Oral Daily   Or  . pantoprazole (PROTONIX) IV  40 mg Intravenous Daily  . potassium chloride  40 mEq Oral Daily  . rivaroxaban  20 mg Oral Q supper  . sodium chloride  10-40 mL Intracatheter Q12H   Continuous Infusions: . dextrose 5 % and 0.45% NaCl 10 mL/hr at 06/18/13 1500    Total time 40 minutes  [name]

## 2013-06-19 NOTE — Progress Notes (Signed)
Ambulate in the hall 2x . O2 [place] on 1L [place], 94%.

## 2013-06-19 NOTE — ED Provider Notes (Signed)
Medical screening examination/treatment/procedure(s) were performed by non-physician practitioner and as supervising physician I was immediately available for consultation/collaboration.   EKG Interpretation None        Orion Vandervort N Letishia Elliott, DO 06/19/13 1457 

## 2013-06-20 MED ORDER — HYDROMORPHONE HCL PF 2 MG/ML IJ SOLN
3.0000 mg | INTRAMUSCULAR | Status: DC | PRN
  Administered 2013-06-20 (×3): 3 mg via INTRAVENOUS
  Filled 2013-06-20 (×3): qty 2

## 2013-06-20 MED ORDER — METOPROLOL TARTRATE 25 MG PO TABS: 25.0000 mg | ORAL_TABLET | Freq: Two times a day (BID) | ORAL | Status: DC

## 2013-06-20 MED ORDER — LISINOPRIL 10 MG PO TABS: 10.0000 mg | ORAL_TABLET | Freq: Every day | ORAL | Status: DC

## 2013-06-20 MED ORDER — LEVOFLOXACIN 750 MG PO TABS: 750.0000 mg | ORAL_TABLET | Freq: Every day | ORAL | Status: DC

## 2013-06-20 MED ORDER — HEPARIN SOD (PORK) LOCK FLUSH 100 UNIT/ML IV SOLN
500.0000 [IU] | INTRAVENOUS | Status: AC | PRN
  Administered 2013-06-20: 500 [IU]

## 2013-06-20 NOTE — Progress Notes (Signed)
Pt d/c home. D/c instructions reviewed with patient. Pt has no concerns. Pt verbalized understatnding

## 2013-06-20 NOTE — Discharge Summary (Signed)
Physician Discharge Summary  Patient ID: [name] MRN: [id number] DOB/AGE: 07-05-1980 33 y.o.  Admit date: 06/17/2013 Discharge date: 06/20/2013  Admission Diagnoses:  Discharge Diagnoses:  Principal Problem:   Acute chest syndrome Active Problems:   Hx of pulmonary embolus   Leukocytosis, unspecified   Hypokalemia   Sickle cell crisis   Bilateral flank pain   Sickle cell anemia   Hypoxemia   NSTEMI (non-ST elevated myocardial infarction)   Pulmonary HTN   Discharged Condition: good  Hospital Course: Patient was admitted with acute chest syndrome sickle cell painful crisis. He had series of chest x-rays. Was found to have recurrent pneumonia treated as healthcare associated pneumonia. He was treated with IV antibiotics in addition to Dilaudid PCA oxygen as well as Toradol and IV hydration. He also had elevated cardiac enzymes that was initially felt to be non-ST elevation MI. He was seen by cardiology who believes it is due to severe pulmonary hypertension. He was hypoxemic at some point. Patient however did very well at the time of dischargehe was back on his room air and no other problems. He was discharged home on oral Levaquin to continue followup in the clinic. Also to resume his home medications   Consults: None  Significant Diagnostic Studies: labs: CBCs and CMP  Treatments: IV hydration  Discharge Exam: Blood pressure 123/83, pulse 72, temperature 98.6 F (37 C), temperature source Oral, resp. rate 16, height 6' (1.829 m), weight 74.571 kg (164 lb 6.4 oz), SpO2 96.00%. General appearance: alert, cooperative and no distress Eyes: conjunctivae/corneas clear. PERRL, EOM's intact. Fundi benign. Neck: no adenopathy, no carotid bruit, no JVD, supple, symmetrical, trachea midline and thyroid not enlarged, symmetric, no tenderness/mass/nodules Back: symmetric, no curvature. ROM normal. No CVA tenderness. Resp: clear to auscultation bilaterally Chest wall: no  tenderness Cardio: regular rate and rhythm, S1, S2 normal, no murmur, click, rub or gallop GI: soft, non-tender; bowel sounds normal; no masses,  no organomegaly Extremities: extremities normal, atraumatic, no cyanosis or edema Pulses: 2+ and symmetric Skin: Skin color, texture, turgor normal. No rashes or lesions Neurologic: Grossly normal  Disposition: 01-Home or Self Care     Medication List         folic acid 1 MG tablet  Commonly known as:  FOLVITE  Take 1 tablet (1 mg total) by mouth every morning.     HYDROmorphone 4 MG tablet  Commonly known as:  DILAUDID  Take 1 tablet (4 mg total) by mouth every 4 (four) hours as needed for severe pain.     hydroxyurea 500 MG capsule  Commonly known as:  HYDREA  Take 1,000 mg by mouth daily. May take with food to minimize GI side effects.     levofloxacin 750 MG tablet  Commonly known as:  LEVAQUIN  Take 1 tablet (750 mg total) by mouth daily.     lisinopril 10 MG tablet  Commonly known as:  PRINIVIL,ZESTRIL  Take 1 tablet (10 mg total) by mouth daily.     metoprolol tartrate 25 MG tablet  Commonly known as:  LOPRESSOR  Take 1 tablet (25 mg total) by mouth 2 (two) times daily.     morphine 15 MG 12 hr tablet  Commonly known as:  MS CONTIN  Take 1 tablet (15 mg total) by mouth 2 (two) times daily.     potassium chloride SA 20 MEQ tablet  Commonly known as:  K-DUR,KLOR-CON  Take 1 tablet (20 mEq total) by mouth every morning.  XARELTO 20 MG Tabs tablet  Generic drug:  rivaroxaban  Take 20 mg by mouth daily with supper.     zolpidem 10 MG tablet  Commonly known as:  AMBIEN  Take 1 tablet (10 mg total) by mouth at bedtime as needed for sleep.         Signed: [name] L [name] 06/20/2013, 3:55 PM  time spent 32 minutes

## 2013-06-20 NOTE — Progress Notes (Signed)
D/c paper work completed. Pt still waiting on IV nurse to d/c picc line

## 2013-06-20 NOTE — Progress Notes (Signed)
Patient ambulated 4x around [place]. Patient tolerated well. Will continue to monitor closely.

## 2013-06-25 ENCOUNTER — Institutional Professional Consult (permissible substitution): Payer: Medicare Other | Admitting: Emergency Medicine

## 2013-06-26 ENCOUNTER — Inpatient Hospital Stay (HOSPITAL_COMMUNITY)
Admission: EM | Admit: 2013-06-26 | Discharge: 2013-06-27 | DRG: 811 | Discharge status: Against Medical Advice | Payer: Medicare Other | Attending: Internal Medicine | Admitting: Internal Medicine

## 2013-06-26 ENCOUNTER — Emergency Department (HOSPITAL_COMMUNITY): Payer: Medicare Other

## 2013-06-26 ENCOUNTER — Encounter (HOSPITAL_COMMUNITY): Payer: Self-pay | Admitting: Emergency Medicine

## 2013-06-26 DIAGNOSIS — I2782 Chronic pulmonary embolism: Secondary | ICD-10-CM | POA: Diagnosis present

## 2013-06-26 DIAGNOSIS — E876 Hypokalemia: Secondary | ICD-10-CM | POA: Diagnosis present

## 2013-06-26 DIAGNOSIS — R0989 Other specified symptoms and signs involving the circulatory and respiratory systems: Secondary | ICD-10-CM | POA: Diagnosis not present

## 2013-06-26 DIAGNOSIS — Z87891 Personal history of nicotine dependence: Secondary | ICD-10-CM | POA: Diagnosis not present

## 2013-06-26 DIAGNOSIS — I10 Essential (primary) hypertension: Secondary | ICD-10-CM | POA: Diagnosis not present

## 2013-06-26 DIAGNOSIS — F1210 Cannabis abuse, uncomplicated: Secondary | ICD-10-CM | POA: Diagnosis present

## 2013-06-26 DIAGNOSIS — D72829 Elevated white blood cell count, unspecified: Secondary | ICD-10-CM | POA: Diagnosis present

## 2013-06-26 DIAGNOSIS — F4320 Adjustment disorder, unspecified: Secondary | ICD-10-CM | POA: Diagnosis not present

## 2013-06-26 DIAGNOSIS — D649 Anemia, unspecified: Secondary | ICD-10-CM

## 2013-06-26 DIAGNOSIS — M79609 Pain in unspecified limb: Secondary | ICD-10-CM | POA: Diagnosis present

## 2013-06-26 DIAGNOSIS — R17 Unspecified jaundice: Secondary | ICD-10-CM | POA: Diagnosis not present

## 2013-06-26 DIAGNOSIS — J189 Pneumonia, unspecified organism: Secondary | ICD-10-CM | POA: Diagnosis present

## 2013-06-26 DIAGNOSIS — R079 Chest pain, unspecified: Secondary | ICD-10-CM | POA: Diagnosis present

## 2013-06-26 DIAGNOSIS — D5701 Hb-SS disease with acute chest syndrome: Secondary | ICD-10-CM | POA: Diagnosis present

## 2013-06-26 DIAGNOSIS — D5700 Hb-SS disease with crisis, unspecified: Secondary | ICD-10-CM | POA: Diagnosis not present

## 2013-06-26 DIAGNOSIS — Z832 Family history of diseases of the blood and blood-forming organs and certain disorders involving the immune mechanism: Secondary | ICD-10-CM

## 2013-06-26 DIAGNOSIS — D473 Essential (hemorrhagic) thrombocythemia: Secondary | ICD-10-CM | POA: Diagnosis present

## 2013-06-26 DIAGNOSIS — Z86711 Personal history of pulmonary embolism: Secondary | ICD-10-CM | POA: Diagnosis present

## 2013-06-26 DIAGNOSIS — Z7901 Long term (current) use of anticoagulants: Secondary | ICD-10-CM | POA: Diagnosis not present

## 2013-06-26 DIAGNOSIS — R071 Chest pain on breathing: Secondary | ICD-10-CM | POA: Diagnosis not present

## 2013-06-26 LAB — COMPREHENSIVE METABOLIC PANEL
ALT: 29 U/L (ref 0–53)
AST: 53 U/L — ABNORMAL HIGH (ref 0–37)
Albumin: 4.1 g/dL (ref 3.5–5.2)
Alkaline Phosphatase: 91 U/L (ref 39–117)
BUN: 24 mg/dL — AB (ref 6–23)
CALCIUM: 9.1 mg/dL (ref 8.4–10.5)
CHLORIDE: 102 meq/L (ref 96–112)
CO2: 21 mEq/L (ref 19–32)
CREATININE: 0.76 mg/dL (ref 0.50–1.35)
GFR calc Af Amer: >90 mL/min (ref >90)
Glucose, Bld: 119 mg/dL — ABNORMAL HIGH (ref 70–99)
Potassium: 3.6 mEq/L — ABNORMAL LOW (ref 3.7–5.3)
Sodium: 137 mEq/L (ref 137–147)
Total Bilirubin: 7 mg/dL — ABNORMAL HIGH (ref 0.3–1.2)
Total Protein: 7.8 g/dL (ref 6.0–8.3)

## 2013-06-26 LAB — CBC WITH DIFFERENTIAL/PLATELET
Basophils Absolute: 0.2 10*3/uL — ABNORMAL HIGH (ref 0.0–0.1)
Basophils Relative: 1 % (ref 0–1)
Eosinophils Absolute: 0.5 10*3/uL (ref 0.0–0.7)
Eosinophils Relative: 3 % (ref 0–5)
HEMATOCRIT: 16.4 % — AB (ref 39.0–52.0)
Hemoglobin: 5.7 g/dL — CL (ref 13.0–17.0)
Lymphocytes Relative: 25 % (ref 12–46)
Lymphs Abs: 4.2 10*3/uL — ABNORMAL HIGH (ref 0.7–4.0)
MCH: 31.7 pg (ref 26.0–34.0)
MCHC: 34.8 g/dL (ref 30.0–36.0)
MCV: 91.1 fL (ref 78.0–100.0)
MONO ABS: 2.4 10*3/uL — AB (ref 0.1–1.0)
MONOS PCT: 14 % — AB (ref 3–12)
NEUTROS ABS: 9.6 10*3/uL — AB (ref 1.7–7.7)
Neutrophils Relative %: 57 % (ref 43–77)
Platelets: 503 10*3/uL — ABNORMAL HIGH (ref 150–400)
RBC: 1.8 MIL/uL — ABNORMAL LOW (ref 4.22–5.81)
RDW: 18.8 % — AB (ref 11.5–15.5)
WBC: 16.9 10*3/uL — AB (ref 4.0–10.5)

## 2013-06-26 LAB — RETICULOCYTES
RBC.: 1.8 MIL/uL — AB (ref 4.22–5.81)
RETIC COUNT ABSOLUTE: 72 10*3/uL (ref 19.0–186.0)
RETIC CT PCT: 4 % — AB (ref 0.4–3.1)

## 2013-06-26 LAB — HEMOGLOBIN AND HEMATOCRIT, BLOOD
HEMATOCRIT: 23.2 % — AB (ref 39.0–52.0)
Hemoglobin: 8 g/dL — ABNORMAL LOW (ref 13.0–17.0)

## 2013-06-26 LAB — PREPARE RBC (CROSSMATCH)

## 2013-06-26 LAB — I-STAT CG4 LACTIC ACID, ED: Lactic Acid, Venous: 0.62 mmol/L (ref 0.5–2.2)

## 2013-06-26 LAB — TROPONIN I: Troponin I: <0.3 ng/mL (ref <0.30)

## 2013-06-26 LAB — APTT: aPTT: 40 seconds — ABNORMAL HIGH (ref 24–37)

## 2013-06-26 LAB — PROTIME-INR
INR: 1.92 — ABNORMAL HIGH (ref 0.00–1.49)
Prothrombin Time: 21.4 seconds — ABNORMAL HIGH (ref 11.6–15.2)

## 2013-06-26 MED ORDER — POTASSIUM CHLORIDE CRYS ER 20 MEQ PO TBCR
20.0000 meq | EXTENDED_RELEASE_TABLET | Freq: Every morning | ORAL | Status: DC
  Administered 2013-06-26 – 2013-06-27 (×2): 20 meq via ORAL
  Filled 2013-06-26 (×2): qty 1

## 2013-06-26 MED ORDER — SODIUM CHLORIDE 0.9 % IJ SOLN: 9.0000 mL | INTRAMUSCULAR | Status: DC | PRN

## 2013-06-26 MED ORDER — MORPHINE SULFATE ER 15 MG PO TBCR
15.0000 mg | EXTENDED_RELEASE_TABLET | Freq: Two times a day (BID) | ORAL | Status: DC
  Administered 2013-06-26 – 2013-06-27 (×3): 15 mg via ORAL
  Filled 2013-06-26 (×3): qty 1

## 2013-06-26 MED ORDER — DEXTROSE 5 % IV SOLN
1.0000 g | Freq: Once | INTRAVENOUS | Status: AC
  Administered 2013-06-26: 1 g via INTRAVENOUS
  Filled 2013-06-26: qty 10

## 2013-06-26 MED ORDER — SODIUM CHLORIDE 0.9 % IJ SOLN
INTRAMUSCULAR | Status: AC
  Administered 2013-06-26: 10 mL
  Filled 2013-06-26: qty 10

## 2013-06-26 MED ORDER — LEVOFLOXACIN 750 MG PO TABS
750.0000 mg | ORAL_TABLET | Freq: Every day | ORAL | Status: DC
  Administered 2013-06-27: 750 mg via ORAL
  Filled 2013-06-26: qty 1

## 2013-06-26 MED ORDER — HYDROMORPHONE HCL PF 2 MG/ML IJ SOLN
2.0000 mg | Freq: Once | INTRAMUSCULAR | Status: AC
  Administered 2013-06-26: 2 mg via INTRAVENOUS
  Filled 2013-06-26: qty 1

## 2013-06-26 MED ORDER — DEXTROSE 5 % IV SOLN
500.0000 mg | Freq: Once | INTRAVENOUS | Status: AC
  Administered 2013-06-26: 500 mg via INTRAVENOUS

## 2013-06-26 MED ORDER — ONDANSETRON HCL 4 MG/2ML IJ SOLN: 4.0000 mg | Freq: Four times a day (QID) | INTRAMUSCULAR | Status: DC | PRN

## 2013-06-26 MED ORDER — ZOLPIDEM TARTRATE 10 MG PO TABS
10.0000 mg | ORAL_TABLET | Freq: Every evening | ORAL | Status: DC | PRN
  Administered 2013-06-26: 10 mg via ORAL
  Filled 2013-06-26: qty 1

## 2013-06-26 MED ORDER — SODIUM CHLORIDE 0.45 % IV SOLN
INTRAVENOUS | Status: DC
  Administered 2013-06-26: 10:00:00 via INTRAVENOUS

## 2013-06-26 MED ORDER — DIPHENHYDRAMINE HCL 50 MG/ML IJ SOLN
12.5000 mg | Freq: Four times a day (QID) | INTRAMUSCULAR | Status: DC | PRN
  Administered 2013-06-26 – 2013-06-27 (×3): 12.5 mg via INTRAVENOUS
  Filled 2013-06-26 (×3): qty 1

## 2013-06-26 MED ORDER — METOPROLOL TARTRATE 25 MG PO TABS
25.0000 mg | ORAL_TABLET | Freq: Two times a day (BID) | ORAL | Status: DC
  Administered 2013-06-26 – 2013-06-27 (×3): 25 mg via ORAL
  Filled 2013-06-26 (×5): qty 1

## 2013-06-26 MED ORDER — HYDROMORPHONE HCL PF 1 MG/ML IJ SOLN
1.0000 mg | Freq: Once | INTRAMUSCULAR | Status: AC
  Administered 2013-06-26: 1 mg via INTRAVENOUS
  Filled 2013-06-26: qty 1

## 2013-06-26 MED ORDER — LISINOPRIL 10 MG PO TABS
10.0000 mg | ORAL_TABLET | Freq: Every day | ORAL | Status: DC
  Administered 2013-06-27: 10 mg via ORAL
  Filled 2013-06-26: qty 1

## 2013-06-26 MED ORDER — RIVAROXABAN 20 MG PO TABS
20.0000 mg | ORAL_TABLET | Freq: Every day | ORAL | Status: DC
  Administered 2013-06-26: 20 mg via ORAL
  Filled 2013-06-26 (×2): qty 1

## 2013-06-26 MED ORDER — FOLIC ACID 1 MG PO TABS
1.0000 mg | ORAL_TABLET | Freq: Every morning | ORAL | Status: DC
  Administered 2013-06-26: 1 mg via ORAL
  Filled 2013-06-26 (×2): qty 1

## 2013-06-26 MED ORDER — DIPHENHYDRAMINE HCL 50 MG/ML IJ SOLN
12.5000 mg | Freq: Once | INTRAMUSCULAR | Status: AC
  Administered 2013-06-26: 12.5 mg via INTRAVENOUS
  Filled 2013-06-26: qty 1

## 2013-06-26 MED ORDER — SODIUM CHLORIDE 0.9 % IV SOLN
Freq: Once | INTRAVENOUS | Status: AC
  Administered 2013-06-26: 07:00:00 via INTRAVENOUS

## 2013-06-26 MED ORDER — HYDROXYUREA 500 MG PO CAPS: 1000.0000 mg | ORAL_CAPSULE | Freq: Every day | ORAL | Status: DC

## 2013-06-26 MED ORDER — SODIUM CHLORIDE 0.9 % IJ SOLN: 10.0000 mL | INTRAMUSCULAR | Status: DC | PRN

## 2013-06-26 MED ORDER — NALOXONE HCL 0.4 MG/ML IJ SOLN: 0.4000 mg | INTRAMUSCULAR | Status: DC | PRN

## 2013-06-26 MED ORDER — HYDROMORPHONE 0.3 MG/ML IV SOLN
INTRAVENOUS | Status: DC
  Administered 2013-06-26: 14.5 mg via INTRAVENOUS
  Administered 2013-06-26: 6.89 mg via INTRAVENOUS
  Administered 2013-06-26 – 2013-06-27 (×4): via INTRAVENOUS
  Administered 2013-06-27: 4.79 mg via INTRAVENOUS
  Filled 2013-06-26 (×5): qty 25

## 2013-06-26 MED ORDER — SODIUM CHLORIDE 0.9 % IV BOLUS (SEPSIS)
1000.0000 mL | Freq: Once | INTRAVENOUS | Status: AC
  Administered 2013-06-26: 1000 mL via INTRAVENOUS

## 2013-06-26 MED ORDER — DIPHENHYDRAMINE HCL 12.5 MG/5ML PO ELIX: 12.5000 mg | ORAL_SOLUTION | Freq: Four times a day (QID) | ORAL | Status: DC | PRN

## 2013-06-26 MED ORDER — HEPARIN SOD (PORK) LOCK FLUSH 100 UNIT/ML IV SOLN
500.0000 [IU] | INTRAVENOUS | Status: DC | PRN
  Filled 2013-06-26: qty 5

## 2013-06-26 NOTE — ED Notes (Signed)
Pt alert and oriented x4. Respirations even and unlabored, bilateral symmetrical rise and fall of chest. Skin warm and dry. In no acute distress. Denies needs.   

## 2013-06-26 NOTE — ED Notes (Signed)
Report given to Nurse on 3 E.

## 2013-06-26 NOTE — ED Provider Notes (Signed)
CSN: [id number]     Arrival date & time 06/26/13  0503 History   First MD Initiated Contact with Patient 06/26/13 0533     Chief Complaint  Patient presents with  . Sickle Cell Pain Crisis     (Consider location/radiation/quality/duration/timing/severity/associated sxs/prior Treatment) HPI Comments: Pt with hx of sickle cell, chronic PE, comes in with cc of chest pain, shoulder pain, thigh pain. Pt reports that his pain started y'day, and has not been responding to his oral home meds. Pt has mild cough, dry. He denies any n/v/f/c. Pt's pain is sharp and constant. He alleges having had cholecystectomy, splenectomy and joint surgery in the past.  Patient is a 33 y.o. male presenting with sickle cell pain. The history is provided by the patient.  Sickle Cell Pain Crisis Associated symptoms: no chest pain, no cough and no shortness of breath     Past Medical History  Diagnosis Date  . Sickle cell anemia   . Blood transfusion   . 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 Hypokalemia   . Mood disorder   . History of pulmonary embolus (PE)   . Avascular necrosis   . Leukocytosis     Chronic  . Thrombocytosis     Chronic  . Hypertension   . History of Clostridium difficile infection   . Uses marijuana   . Chronic anticoagulation   . Functional asplenia   . Former smoker   . Second hand tobacco smoke exposure   . Alcohol consumption of one to four drinks per day    Past Surgical History  Procedure Laterality Date  . Right hip replacement      08/2006  . Cholecystectomy      01/2008  . Porta cath placement    . Porta cath removal    . Umbilical hernia repair      01/2008  . Excision of left periauricular cyst      10/2009  . Excision of right ear lobe cyst with primary closur      11/2007  . Portacath placement  01/05/2012    Procedure: INSERTION PORT-A-CATH;  Surgeon: Odis Hollingshead, MD;  Location: [place];  Service: General;  Laterality: N/A;   ultrasound guiced port a cath insertion with fluoroscopy   Family History  Problem Relation Age of Onset  . Sickle cell anemia Mother   . Sickle cell anemia Father   . Sickle cell trait Brother    History  Substance Use Topics  . Smoking status: Former Smoker -- 13 years    Types: Cigarettes    Quit date: 07/08/2010  . Smokeless tobacco: Never Used  . Alcohol Use: 3.6 oz/week    6 Shots of liquor per week    Review of Systems  Constitutional: Negative for activity change and appetite change.  Respiratory: Negative for cough and shortness of breath.   Cardiovascular: Negative for chest pain.  Gastrointestinal: Negative for abdominal pain.  Genitourinary: Negative for dysuria.  Neurological: Positive for dizziness and light-headedness.  Hematological: Does not bruise/bleed easily.  All other systems reviewed and are negative.     Allergies  Morphine and related  Home Medications   Prior to Admission medications   Medication Sig Start Date End Date Taking? Authorizing Provider  folic acid (FOLVITE) 1 MG tablet Take 1 tablet (1 mg total) by mouth every morning. 06/05/12  Yes [name], MD  HYDROmorphone (DILAUDID) 4 MG tablet Take 1 tablet (4 mg  total) by mouth every 4 (four) hours as needed for severe pain. 05/23/13  Yes Dorena Dew, FNP  hydroxyurea (HYDREA) 500 MG capsule Take 1,000 mg by mouth daily. May take with food to minimize GI side effects. 03/30/13  Yes Elwyn Reach, MD  levofloxacin (LEVAQUIN) 750 MG tablet Take 1 tablet (750 mg total) by mouth daily. 06/20/13  Yes Elwyn Reach, MD  lisinopril (PRINIVIL,ZESTRIL) 10 MG tablet Take 1 tablet (10 mg total) by mouth daily. 06/20/13  Yes Elwyn Reach, MD  metoprolol tartrate (LOPRESSOR) 25 MG tablet Take 1 tablet (25 mg total) by mouth 2 (two) times daily. 06/20/13  Yes Elwyn Reach, MD  morphine (MS CONTIN) 15 MG 12 hr tablet Take 1 tablet (15 mg total) by mouth 2 (two) times daily. 05/23/13  Yes  Dorena Dew, FNP  potassium chloride SA (K-DUR,KLOR-CON) 20 MEQ tablet Take 1 tablet (20 mEq total) by mouth every morning. 03/30/13  Yes Elwyn Reach, MD  Rivaroxaban (XARELTO) 20 MG TABS tablet Take 20 mg by mouth daily with supper.   Yes Historical Provider, MD  zolpidem (AMBIEN) 10 MG tablet Take 1 tablet (10 mg total) by mouth at bedtime as needed for sleep. 04/24/13  Yes Dorena Dew, FNP   BP 133/72  Pulse 97  Temp(Src) 98.6 F (37 C) (Oral)  Resp 16  Ht 6' (1.829 m)  Wt 163 lb (73.936 kg)  BMI 22.10 kg/m2  SpO2 93% Physical Exam  Nursing note and vitals reviewed. Constitutional: He is oriented to person, place, and time. He appears well-developed.  HENT:  Head: Normocephalic and atraumatic.  Eyes: Conjunctivae and EOM are normal. Pupils are equal, round, and reactive to light. Scleral icterus is present.  Neck: Normal range of motion. Neck supple.  Cardiovascular: Normal rate and regular rhythm.   Murmur heard. Pulmonary/Chest: Effort normal and breath sounds normal.  Abdominal: Soft. Bowel sounds are normal. He exhibits no distension. There is no tenderness. There is no rebound and no guarding.  Neurological: He is alert and oriented to person, place, and time.  Skin: Skin is warm.    ED Course  Procedures (including critical care time) Labs Review Labs Reviewed  CBC WITH DIFFERENTIAL - Abnormal; Notable for the following:    WBC 16.9 (*)    RBC 1.80 (*)    Hemoglobin 5.7 (*)    HCT 16.4 (*)    RDW 18.8 (*)    Platelets 503 (*)    Monocytes Relative 14 (*)    Neutro Abs 9.6 (*)    Lymphs Abs 4.2 (*)    Monocytes Absolute 2.4 (*)    Basophils Absolute 0.2 (*)    All other components within normal limits  COMPREHENSIVE METABOLIC PANEL - Abnormal; Notable for the following:    Potassium 3.6 (*)    Glucose, Bld 119 (*)    BUN 24 (*)    AST 53 (*)    Total Bilirubin 7.0 (*)    All other components within normal limits  RETICULOCYTES - Abnormal;  Notable for the following:    Retic Ct Pct 4.0 (*)    RBC. 1.80 (*)    All other components within normal limits  APTT - Abnormal; Notable for the following:    aPTT 40 (*)    All other components within normal limits  PROTIME-INR - Abnormal; Notable for the following:    Prothrombin Time 21.4 (*)    INR 1.92 (*)  All other components within normal limits  TROPONIN I  I-STAT CG4 LACTIC ACID, ED  TYPE AND SCREEN  PREPARE RBC (CROSSMATCH)    Imaging Review Dg Chest 2 View  06/26/2013   CLINICAL DATA:  Sickle cell disease.  Mid upper chest pain.  EXAM: CHEST  2 VIEW  COMPARISON:  Chest radiograph performed 06/18/2013  FINDINGS: The lungs are well-aerated. Mild vascular congestion is noted. A small peripheral focus of airspace opacity at the right midlung zone is again seen. This may reflect infection or possibly changes of acute chest syndrome. There is no evidence of pleural effusion or pneumothorax.  The heart is mildly enlarged. A left-sided chest port is noted ending about the distal SVC. No acute osseous abnormalities are seen. Clips are noted within the right upper quadrant, reflecting prior cholecystectomy.  IMPRESSION: Mild vascular congestion and mild cardiomegaly noted. Small peripheral focus of airspace opacity again noted at the right midlung zone, which may reflect infection or possibly changes of acute chest syndrome. This is slightly less prominent than on the prior study.   Electronically Signed   By: Garald Balding M.D.   On: 06/26/2013 06:31     EKG Interpretation None      MDM   Final diagnoses:  None    Pt with hx of sickle cell anemia comes in with cc of chest pain, shoulder pain and thigh pain. All joints look normal in appearance, with no swelling, no signs of infection.  Exam shows icteris sclerae - and his bili is elevated. Pt also has anemia, with hb of 5.6. CXR shows right sided opacity.  Will tx as acute chest. Will transfuse. Dr. Zigmund Daniel will  likely see the patient tomorrow - she is aware of patient being admitted.  CRITICAL CARE Performed by: Xzaria Teo   Total critical care time: 45 min - acute anemia, requiring transfusion, tachycardia, dyspnea.  Critical care time was exclusive of separately billable procedures and treating other patients.  Critical care was necessary to treat or prevent imminent or life-threatening deterioration.  Critical care was time spent personally by me on the following activities: development of treatment plan with patient and/or surrogate as well as nursing, discussions with consultants, evaluation of patient's response to treatment, examination of patient, obtaining history from patient or surrogate, ordering and performing treatments and interventions, ordering and review of laboratory studies, ordering and review of radiographic studies, pulse oximetry and re-evaluation of patient's condition.   Varney Biles, MD 06/26/13 [phone]

## 2013-06-26 NOTE — ED Notes (Signed)
Inform consent obtained

## 2013-06-26 NOTE — Progress Notes (Signed)
PHARMACY BRIEF NOTE: HYDROXYUREA   By [facility] policy, hydroxyurea is automatically held when any of the following laboratory values occur:  ANC < 2 K  Pltc < 80K in sickle-cell patients; < 100K in other patients  Hgb <= 6 in sickle-cell patients; < 8 in other patients  Reticulocytes < 80K when Hgb < 9  Hydroxyurea has been held (discontinued from profile) per policy due to Hgb 5.7  Romeo Rabon, PharmD, pager [phone]. 06/26/2013,10:41 AM.

## 2013-06-26 NOTE — ED Notes (Signed)
Lab called with a critical Hgb of 5.7.  [place] notified.

## 2013-06-26 NOTE — Consult Note (Signed)
[name]   Reason for Consult:  Depression Referring Physician:  [name] is an 33 y.o. male. Total Time spent with patient: 15 minutes  Assessment: AXIS I:  Adjustment Disorder NOS AXIS II:  Deferred AXIS III:   Past Medical History  Diagnosis Date  . Sickle cell anemia   . Blood transfusion   . 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 Hypokalemia   . Mood disorder   . History of pulmonary embolus (PE)   . Avascular necrosis   . Leukocytosis     Chronic  . Thrombocytosis     Chronic  . Hypertension   . History of Clostridium difficile infection   . Uses marijuana   . Chronic anticoagulation   . Functional asplenia   . Former smoker   . Second hand tobacco smoke exposure   . Alcohol consumption of one to four drinks per day    AXIS IV:  other psychosocial or environmental problems AXIS V:  61-70 mild symptoms  Plan:  No evidence of imminent risk to self or others at present.   Patient does not meet criteria for psychiatric inpatient admission. Supportive therapy provided about ongoing stressors. Discussed crisis plan, support from social network, calling 911, coming to the Emergency Department, and calling Suicide Hotline.  Subjective:   [name] is a 33 y.o. male patient admitted with a sickle cell crisis.  HPI:  The patient was seen chart reviewed.  Patient is 33 year old African American man who was admitted on the medical floor because of sickle cell crisis in pain.  Consult was called by the emergency room physician because of depression.  Patient denies any previous history of depression are seeing psychiatrists.  Patient initially believed that his primary care physician Dr. Marcelline Deist has called consult but I clarified that they were called by his emergency room physician for the consult.  Patient denied that he has a history of depression.  The patient gets very sensitive and  guarded and endorsed that everyone gets depressed in their lifetime and does not need to see psychiatrist.  Patient do not recall taking any psychotropic medication in the past.  Patient denies any hallucination, paranoia, suicidal thoughts or homicidal thoughts.  He denies any changes in his sleep.  He lives by himself.  He was cooperative but also mentioned that he does not want to be seen by psychiatry.  His behavior was calm and relevant.  He has no tremors or shakes.  He was not combative or aggressive.    Past Psychiatric History: Past Medical History  Diagnosis Date  . Sickle cell anemia   . Blood transfusion   . 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 Hypokalemia   . Mood disorder   . History of pulmonary embolus (PE)   . Avascular necrosis   . Leukocytosis     Chronic  . Thrombocytosis     Chronic  . Hypertension   . History of Clostridium difficile infection   . Uses marijuana   . Chronic anticoagulation   . Functional asplenia   . Former smoker   . Second hand tobacco smoke exposure   . Alcohol consumption of one to four drinks per day     reports that he quit smoking about 2 years ago. His smoking use included Cigarettes. He smoked 0.00 packs per day for 13 years. He has never used smokeless tobacco.  He reports that he drinks about 3.6 ounces of alcohol per week. He reports that he uses illicit drugs (Marijuana) about twice per week. Family History  Problem Relation Age of Onset  . Sickle cell anemia Mother   . Sickle cell anemia Father   . Sickle cell trait Brother      Living Arrangements: Alone   Abuse/Neglect [facility]) Physical Abuse: Denies Verbal Abuse: Denies Sexual Abuse: Denies Allergies:   Allergies  Allergen Reactions  . Morphine And Related Hives and Rash    Pt states he can take PO, but not IV and he is able to tolerate Dilaudid with no reactions.    ACT Assessment Complete:  No:   Past Psychiatric History: Patient has no  previous psychiatric history.  Objective: Blood pressure 127/75, pulse 80, temperature 98.6 F (37 C), temperature source Oral, resp. rate 20, height 6' (1.829 m), weight 160 lb 6.4 oz (72.757 kg), SpO2 96.00%.Body mass index is 21.75 kg/(m^2). Results for orders placed during the hospital encounter of 06/26/13 (from the past 72 hour(s))  CBC WITH DIFFERENTIAL     Status: Abnormal   Collection Time    06/26/13  5:30 AM      Result Value Ref Range   WBC 16.9 (*) 4.0 - 10.5 K/uL   RBC 1.80 (*) 4.22 - 5.81 MIL/uL   Hemoglobin 5.7 (*) 13.0 - 17.0 g/dL   Comment: REPEATED TO VERIFY     CRITICAL RESULT CALLED TO, READ BACK BY AND VERIFIED WITH:     M. TEUP RN AT 0550 ON 05.27.15 BY SHUEA   HCT 16.4 (*) 39.0 - 52.0 %   MCV 91.1  78.0 - 100.0 fL   MCH 31.7  26.0 - 34.0 pg   MCHC 34.8  30.0 - 36.0 g/dL   RDW 18.8 (*) 11.5 - 15.5 %   Platelets 503 (*) 150 - 400 K/uL   Neutrophils Relative % 57  43 - 77 %   Lymphocytes Relative 25  12 - 46 %   Monocytes Relative 14 (*) 3 - 12 %   Eosinophils Relative 3  0 - 5 %   Basophils Relative 1  0 - 1 %   Neutro Abs 9.6 (*) 1.7 - 7.7 K/uL   Lymphs Abs 4.2 (*) 0.7 - 4.0 K/uL   Monocytes Absolute 2.4 (*) 0.1 - 1.0 K/uL   Eosinophils Absolute 0.5  0.0 - 0.7 K/uL   Basophils Absolute 0.2 (*) 0.0 - 0.1 K/uL   RBC Morphology POLYCHROMASIA PRESENT     Comment: TARGET CELLS     HOWELL/JOLLY BODIES     SICKLE CELLS  COMPREHENSIVE METABOLIC PANEL     Status: Abnormal   Collection Time    06/26/13  5:30 AM      Result Value Ref Range   Sodium 137  137 - 147 mEq/L   Potassium 3.6 (*) 3.7 - 5.3 mEq/L   Chloride 102  96 - 112 mEq/L   CO2 21  19 - 32 mEq/L   Glucose, Bld 119 (*) 70 - 99 mg/dL   BUN 24 (*) 6 - 23 mg/dL   Creatinine, Ser 0.76  0.50 - 1.35 mg/dL   Calcium 9.1  8.4 - 10.5 mg/dL   Total Protein 7.8  6.0 - 8.3 g/dL   Albumin 4.1  3.5 - 5.2 g/dL   AST 53 (*) 0 - 37 U/L   ALT 29  0 - 53 U/L   Alkaline Phosphatase 91  39 -  117 U/L   Total  Bilirubin 7.0 (*) 0.3 - 1.2 mg/dL   GFR calc non Af Amer >90  >90 mL/min   GFR calc Af Amer >90  >90 mL/min   Comment: (NOTE)     The eGFR has been calculated using the CKD EPI equation.     This calculation has not been validated in all clinical situations.     eGFR's persistently <90 mL/min signify possible Chronic Kidney     Disease.  RETICULOCYTES     Status: Abnormal   Collection Time    06/26/13  5:30 AM      Result Value Ref Range   Retic Ct Pct 4.0 (*) 0.4 - 3.1 %   RBC. 1.80 (*) 4.22 - 5.81 MIL/uL   Retic Count, Manual 72.0  19.0 - 186.0 K/uL  TYPE AND SCREEN     Status: None   Collection Time    06/26/13  6:20 AM      Result Value Ref Range   ABO/RH(D) O POS     Antibody Screen NEG     Sample Expiration 06/29/2013     Unit Number [id number]     Blood Component Type RED CELLS,LR     Unit division 00     Status of Unit ISSUED     Donor AG Type       Value: NEGATIVE FOR C ANTIGEN NEGATIVE FOR E ANTIGEN NEGATIVE FOR KELL ANTIGEN   Transfusion Status OK TO TRANSFUSE     Crossmatch Result COMPATIBLE     Unit Number [id number]     Blood Component Type RED CELLS,LR     Unit division 00     Status of Unit ISSUED     Donor AG Type       Value: NEGATIVE FOR C ANTIGEN NEGATIVE FOR E ANTIGEN NEGATIVE FOR KELL ANTIGEN   Transfusion Status OK TO TRANSFUSE     Crossmatch Result COMPATIBLE     Unit Number [id number]     Blood Component Type RED CELLS,LR     Unit division 00     Status of Unit ALLOCATED     Donor AG Type       Value: NEGATIVE FOR C ANTIGEN NEGATIVE FOR E ANTIGEN NEGATIVE FOR KELL ANTIGEN   Transfusion Status OK TO TRANSFUSE     Crossmatch Result COMPATIBLE    APTT     Status: Abnormal   Collection Time    06/26/13  6:25 AM      Result Value Ref Range   aPTT 40 (*) 24 - 37 seconds   Comment:            IF BASELINE aPTT IS ELEVATED,     SUGGEST PATIENT RISK ASSESSMENT     BE USED TO DETERMINE APPROPRIATE     ANTICOAGULANT THERAPY.  PROTIME-INR      Status: Abnormal   Collection Time    06/26/13  6:25 AM      Result Value Ref Range   Prothrombin Time 21.4 (*) 11.6 - 15.2 seconds   INR 1.92 (*) 0.00 - 1.49  TROPONIN I     Status: None   Collection Time    06/26/13  6:25 AM      Result Value Ref Range   Troponin I <0.30  <0.30 ng/mL   Comment:            Due to the release kinetics of cTnI,     a negative result within the first  hours     of the onset of symptoms does not rule out     myocardial infarction with certainty.     If myocardial infarction is still suspected,     repeat the test at appropriate intervals.  I-STAT CG4 LACTIC ACID, ED     Status: None   Collection Time    06/26/13  6:37 AM      Result Value Ref Range   Lactic Acid, Venous 0.62  0.5 - 2.2 mmol/L  PREPARE RBC (CROSSMATCH)     Status: None   Collection Time    06/26/13  7:00 AM      Result Value Ref Range   Order Confirmation ORDER PROCESSED BY BLOOD BANK     Labs are reviewed.  Current Facility-Administered Medications  Medication Dose Route Frequency Provider Last Rate Last Dose  . 0.45 % sodium chloride infusion   Intravenous Continuous Hosie Poisson, MD 125 mL/hr at 06/26/13 0940    . diphenhydrAMINE (BENADRYL) injection 12.5 mg  12.5 mg Intravenous Q6H PRN Hosie Poisson, MD       Or  . diphenhydrAMINE (BENADRYL) 12.5 MG/5ML elixir 12.5 mg  12.5 mg Oral Q6H PRN Hosie Poisson, MD      . folic acid (FOLVITE) tablet 1 mg  1 mg Oral q morning - 10a Hosie Poisson, MD   1 mg at 06/26/13 1138  . heparin lock flush 100 unit/mL  500 Units Intracatheter PRN Hosie Poisson, MD      . HYDROmorphone (DILAUDID) PCA injection 0.3 mg/mL   Intravenous 6 times per day Hosie Poisson, MD      . Derrill Memo ON 06/27/2013] lisinopril (PRINIVIL,ZESTRIL) tablet 10 mg  10 mg Oral Daily Hosie Poisson, MD      . metoprolol tartrate (LOPRESSOR) tablet 25 mg  25 mg Oral BID Hosie Poisson, MD   25 mg at 06/26/13 1138  . morphine (MS CONTIN) 12 hr tablet 15 mg  15 mg Oral BID Hosie Poisson,  MD   15 mg at 06/26/13 1138  . naloxone [facility]) injection 0.4 mg  0.4 mg Intravenous PRN Hosie Poisson, MD       And  . sodium chloride 0.9 % injection 9 mL  9 mL Intravenous PRN Hosie Poisson, MD      . ondansetron (ZOFRAN) injection 4 mg  4 mg Intravenous Q6H PRN Hosie Poisson, MD      . potassium chloride SA (K-DUR,KLOR-CON) CR tablet 20 mEq  20 mEq Oral q morning - 10a Hosie Poisson, MD   20 mEq at 06/26/13 1138  . rivaroxaban (XARELTO) tablet 20 mg  20 mg Oral Q supper Hosie Poisson, MD      . sodium chloride 0.9 % injection 10 mL  10 mL Intracatheter PRN Hosie Poisson, MD      . zolpidem (AMBIEN) tablet 10 mg  10 mg Oral QHS PRN Hosie Poisson, MD        Psychiatric Specialty Exam:     Blood pressure 127/75, pulse 80, temperature 98.6 F (37 C), temperature source Oral, resp. rate 20, height 6' (1.829 m), weight 160 lb 6.4 oz (72.757 kg), SpO2 96.00%.Body mass index is 21.75 kg/(m^2).  General Appearance: Guarded  Eye Contact::  Fair  Speech:  Slow  Volume:  Normal  Mood:  Irritable  Affect:  Appropriate  Thought Process:  Goal Directed and Logical  Orientation:  Full (Time, Place, and Person)  Thought Content:  WDL  Suicidal Thoughts:  No  Homicidal Thoughts:  No  Memory:  Immediate;   Good Recent;   Good Remote;   Good  Judgement:  Fair  Insight:  Fair  Psychomotor Activity:  Normal  Concentration:  Good  Recall:  Good  Fund of Knowledge:Good  Language: Good  Akathisia:  No  Handed:  Right  AIMS (if indicated):     Assets:  Communication Skills Housing Social Support  Sleep:      Musculoskeletal: Strength & Muscle Tone: within normal limits Gait & Station: Unable to assess Patient leans: Patient is lying on the bed  Treatment Plan Summary: Patient does not require inpatient psychiatric treatment..  Patient is not interested in seeing psychiatrist or taking any psychotropic medication.  At this time his behavior is calm but he does get irritable seeing psychiatrist.   Please call us back at [phone] if this changes or if you have any questions.  Arlyce Harman Arfeen 06/26/2013 2:53 PM

## 2013-06-26 NOTE — H&P (Signed)
[facility] History and Physical  [name] DPO:[id number] DOB: 11-Jan-1981 DOA: 06/26/2013  Referring physician: EDP PCP: [name],[name], MD   Chief Complaint: leg pain since last night.   HPI: [name] is a 33 y.o. male with prior h/o sickle cell disease, PE, recently discharged from the hospital comes back with persistent leg , arm pain and rib pain in the sides since last night. He reports he ran out of medications and was supposed to refill his prescriptions but he was in much pain and had to come to ED. On arrival t o ED ,he was found to have hemoglobin of 5.7 and reports severe pain in the legs and arms. He also reports rib pain. He denies chest pain or sob, or cough. He denies nausea, vomiting and diarrhea.  He was referred to [facility] service for admission.    Review of Systems:  Constitutional:  No weight loss, night sweats, Fevers, chills, fatigue.  HEENT:  No headaches, Difficulty swallowing,Tooth/dental problems,Sore throat,  No sneezing, itching, ear ache, nasal congestion, post nasal drip,  Cardio-vascular:  No chest pain, Orthopnea, PND, swelling in lower extremities, anasarca, dizziness, palpitations  GI:  No heartburn, indigestion, abdominal pain, nausea, vomiting, diarrhea, change in bowel habits, loss of appetite  Resp:  No shortness of breath with exertion or at rest. No excess mucus, no productive cough, No non-productive cough, No coughing up of blood.No change in color of mucus.No wheezing.No chest wall deformity  Skin:  no rash or lesions.  GU:  no dysuria, change in color of urine, no urgency or frequency. No flank pain.  Musculoskeletal:  Painful ROM. Lateral rib pain.  Psych:  No change in mood or affect. No depression or anxiety. No memory loss.   Past Medical History  Diagnosis Date  . Sickle cell anemia   . Blood transfusion   . 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 Hypokalemia   . Mood disorder    . History of pulmonary embolus (PE)   . Avascular necrosis   . Leukocytosis     Chronic  . Thrombocytosis     Chronic  . Hypertension   . History of Clostridium difficile infection   . Uses marijuana   . Chronic anticoagulation   . Functional asplenia   . Former smoker   . Second hand tobacco smoke exposure   . Alcohol consumption of one to four drinks per day    Past Surgical History  Procedure Laterality Date  . Right hip replacement      08/2006  . Cholecystectomy      01/2008  . Porta cath placement    . Porta cath removal    . Umbilical hernia repair      01/2008  . Excision of left periauricular cyst      10/2009  . Excision of right ear lobe cyst with primary closur      11/2007  . Portacath placement  01/05/2012    Procedure: INSERTION PORT-A-CATH;  Surgeon: Odis Hollingshead, MD;  Location: [place];  Service: General;  Laterality: N/A;  ultrasound guiced port a cath insertion with fluoroscopy   Social History:  reports that he quit smoking about 2 years ago. His smoking use included Cigarettes. He smoked 0.00 packs per day for 13 years. He has never used smokeless tobacco. He reports that he drinks about 3.6 ounces of alcohol per week. He reports that he uses illicit drugs (Marijuana) about twice per  week.  Allergies  Allergen Reactions  . Morphine And Related Hives and Rash    Pt states he can take PO, but not IV and he is able to tolerate Dilaudid with no reactions.    Family History  Problem Relation Age of Onset  . Sickle cell anemia Mother   . Sickle cell anemia Father   . Sickle cell trait Brother      Prior to Admission medications   Medication Sig Start Date End Date Taking? Authorizing Provider  folic acid (FOLVITE) 1 MG tablet Take 1 tablet (1 mg total) by mouth every morning. 06/05/12  Yes [name], MD  HYDROmorphone (DILAUDID) 4 MG tablet Take 1 tablet (4 mg total) by mouth every 4 (four) hours as needed for severe pain. 05/23/13  Yes  Dorena Dew, FNP  hydroxyurea (HYDREA) 500 MG capsule Take 1,000 mg by mouth daily. May take with food to minimize GI side effects. 03/30/13  Yes Elwyn Reach, MD  levofloxacin (LEVAQUIN) 750 MG tablet Take 1 tablet (750 mg total) by mouth daily. 06/20/13  Yes Elwyn Reach, MD  lisinopril (PRINIVIL,ZESTRIL) 10 MG tablet Take 1 tablet (10 mg total) by mouth daily. 06/20/13  Yes Elwyn Reach, MD  metoprolol tartrate (LOPRESSOR) 25 MG tablet Take 1 tablet (25 mg total) by mouth 2 (two) times daily. 06/20/13  Yes Elwyn Reach, MD  morphine (MS CONTIN) 15 MG 12 hr tablet Take 1 tablet (15 mg total) by mouth 2 (two) times daily. 05/23/13  Yes Dorena Dew, FNP  potassium chloride SA (K-DUR,KLOR-CON) 20 MEQ tablet Take 1 tablet (20 mEq total) by mouth every morning. 03/30/13  Yes Elwyn Reach, MD  Rivaroxaban (XARELTO) 20 MG TABS tablet Take 20 mg by mouth daily with supper.   Yes Historical Provider, MD  zolpidem (AMBIEN) 10 MG tablet Take 1 tablet (10 mg total) by mouth at bedtime as needed for sleep. 04/24/13  Yes Dorena Dew, FNP   Physical Exam: Filed Vitals:   06/26/13 1015  BP: 115/71  Pulse: 87  Temp: 98.8 F (37.1 C)  Resp: 16    BP 115/71  Pulse 87  Temp(Src) 98.8 F (37.1 C) (Oral)  Resp 16  Ht 6' (1.829 m)  Wt 72.757 kg (160 lb 6.4 oz)  BMI 21.75 kg/m2  SpO2 98%  General:  Appears calm and comfortable Eyes: PERRL, normal lids, irises & conjunctiva ENT: grossly normal hearing, lips & tongue Neck: no LAD, masses or thyromegaly Cardiovascular: RRR, no m/r/g. No LE edema. Respiratory: CTA bilaterally, no w/r/r. Normal respiratory effort. Abdomen: soft, ntnd Skin: no rash or induration seen on limited exam Musculoskeletal: extremities, tender to touvh  Neurologic: grossly non-focal.          Labs on Admission:  Basic Metabolic Panel:  Recent Labs Lab 06/26/13 0530  NA 137  K 3.6*  CL 102  CO2 21  GLUCOSE 119*  BUN 24*  CREATININE 0.76    CALCIUM 9.1   Liver Function Tests:  Recent Labs Lab 06/26/13 0530  AST 53*  ALT 29  ALKPHOS 91  BILITOT 7.0*  PROT 7.8  ALBUMIN 4.1   No results found for this basename: LIPASE, AMYLASE,  in the last 168 hours No results found for this basename: AMMONIA,  in the last 168 hours CBC:  Recent Labs Lab 06/26/13 0530  WBC 16.9*  NEUTROABS 9.6*  HGB 5.7*  HCT 16.4*  MCV 91.1  PLT 503*  Cardiac Enzymes:  Recent Labs Lab 06/26/13 0625  TROPONINI <0.30    BNP (last 3 results)  Recent Labs  11/13/12 1826  PROBNP 106.3   CBG: No results found for this basename: GLUCAP,  in the last 168 hours  Radiological Exams on Admission: Dg Chest 2 View  06/26/2013   CLINICAL DATA:  Sickle cell disease.  Mid upper chest pain.  EXAM: CHEST  2 VIEW  COMPARISON:  Chest radiograph performed 06/18/2013  FINDINGS: The lungs are well-aerated. Mild vascular congestion is noted. A small peripheral focus of airspace opacity at the right midlung zone is again seen. This may reflect infection or possibly changes of acute chest syndrome. There is no evidence of pleural effusion or pneumothorax.  The heart is mildly enlarged. A left-sided chest port is noted ending about the distal SVC. No acute osseous abnormalities are seen. Clips are noted within the right upper quadrant, reflecting prior cholecystectomy.  IMPRESSION: Mild vascular congestion and mild cardiomegaly noted. Small peripheral focus of airspace opacity again noted at the right midlung zone, which may reflect infection or possibly changes of acute chest syndrome. This is slightly less prominent than on the prior study.   Electronically Signed   By: Garald Balding M.D.   On: 06/26/2013 06:31    EKG:  Pending.  Assessment/Plan Active Problems:   Sickle cell crisis   Acute chest syndrome   Leg pain, rib pain and arm pain: - likely secondary to acute sickle cell painful crisis.  - started pt on IV fluids 1/2 NS at 125/hr, later  cut down to 23ml/hr, as he was found to have some vascular congestion.  - custom PCA ordered.  - resumed MS contin.  - 2 units of pRBC ordered and repeat level ordered for tonight.  - Dr Zigmund Daniel aware of pt's admission.   H/o chronic PE: Resume xarelto.    Severe anemia: - 2 units of prbc transfusion ordered and repeat level tonight.  Keep hemoglobin greater than 7.   Persistent pneumonia.  - CONTINUE levaquin for 3 more days to complete the course.  Leukocytosis: - probably from crisis. Repeat in am.   Hypokalemia : - replete a sneeded.   DVT prophylaxis.      Code Status: full code.  Family Communication: none at bedside.  Disposition Plan: admit to inpatient.   Time spent: 75 minutes   Hosie Poisson [facility] Pager [phone]  **Disclaimer: This note may have been dictated with voice recognition software. Similar sounding words can inadvertently be transcribed and this note may contain transcription errors which may not have been corrected upon publication of note.**

## 2013-06-26 NOTE — ED Notes (Signed)
Pt presents to ED with c/o pain crisis d/t sickle cell disease.  Pt reports that he started having ribs pain and shoulder pain last Monday, but now pain is getting worse that it also involves his back now.

## 2013-06-27 ENCOUNTER — Telehealth: Payer: Self-pay | Admitting: Internal Medicine

## 2013-06-27 DIAGNOSIS — D571 Sickle-cell disease without crisis: Secondary | ICD-10-CM

## 2013-06-27 DIAGNOSIS — D5700 Hb-SS disease with crisis, unspecified: Secondary | ICD-10-CM

## 2013-06-27 LAB — CBC
HCT: 23.9 % — ABNORMAL LOW (ref 39.0–52.0)
HEMOGLOBIN: 7.9 g/dL — AB (ref 13.0–17.0)
MCH: 29.6 pg (ref 26.0–34.0)
MCHC: 33.1 g/dL (ref 30.0–36.0)
MCV: 89.5 fL (ref 78.0–100.0)
Platelets: 484 10*3/uL — ABNORMAL HIGH (ref 150–400)
RBC: 2.67 MIL/uL — AB (ref 4.22–5.81)
RDW: 18.3 % — ABNORMAL HIGH (ref 11.5–15.5)
WBC: 17.8 10*3/uL — AB (ref 4.0–10.5)

## 2013-06-27 LAB — BASIC METABOLIC PANEL
BUN: 11 mg/dL (ref 6–23)
CALCIUM: 8.9 mg/dL (ref 8.4–10.5)
CHLORIDE: 103 meq/L (ref 96–112)
CO2: 21 meq/L (ref 19–32)
Creatinine, Ser: 0.56 mg/dL (ref 0.50–1.35)
GFR calc Af Amer: >90 mL/min (ref >90)
GFR calc non Af Amer: >90 mL/min (ref >90)
GLUCOSE: 101 mg/dL — AB (ref 70–99)
POTASSIUM: 3.9 meq/L (ref 3.7–5.3)
SODIUM: 135 meq/L — AB (ref 137–147)

## 2013-06-27 MED ORDER — HEPARIN SOD (PORK) LOCK FLUSH 100 UNIT/ML IV SOLN
500.0000 [IU] | INTRAVENOUS | Status: DC | PRN
  Administered 2013-06-27: 500 [IU]

## 2013-06-27 MED ORDER — MORPHINE SULFATE ER 15 MG PO TBCR: 15.0000 mg | EXTENDED_RELEASE_TABLET | Freq: Two times a day (BID) | ORAL | Status: DC

## 2013-06-27 MED ORDER — ZOLPIDEM TARTRATE 10 MG PO TABS: 10.0000 mg | ORAL_TABLET | Freq: Every evening | ORAL | Status: DC | PRN

## 2013-06-27 MED ORDER — HEPARIN SOD (PORK) LOCK FLUSH 100 UNIT/ML IV SOLN: 500.0000 [IU] | INTRAVENOUS | Status: DC

## 2013-06-27 MED ORDER — HYDROMORPHONE HCL 4 MG PO TABS: 4.0000 mg | ORAL_TABLET | ORAL | Status: DC | PRN

## 2013-06-27 NOTE — Progress Notes (Signed)
PCA 24 Hour total that was cleared this am at 0800 was 30.38mg  of Dilaudid.

## 2013-06-27 NOTE — Progress Notes (Signed)
Spoke with covering MD and he aware that pt is leaving AMA. Pt unwilling to wait on MD to come speak with him in person. Explained to pt the risks of leaving AMA and also that the hospital staff and doctors can be held responsible if something were to happened to him after he leaves AMA.

## 2013-06-27 NOTE — Telephone Encounter (Signed)
Re-ordered MS Contin 15 mg ever 12 hours. #60 Dilaudid 4 mg every 4 hours as needed for severe pain #90.  Ambien 10 mg prn at bedtime #30  Reviewed [place] Substance Reporting system prior to reorder

## 2013-06-27 NOTE — Progress Notes (Signed)
The MD has been text paged to make them aware that the pt states that is he does not have his DC papers by 1100 that he is leaving AMA. Pt also states that the admitting MD yesterday knew that he had to be somewhere today and that he could not stay. Will notify charge nurse and have pt sign AMA papers if he chooses to leave and explain the possible consequences is he still chooses to leave aganist medical advice.

## 2013-06-30 LAB — TYPE AND SCREEN
ABO/RH(D): O POS
ANTIBODY SCREEN: NEGATIVE
UNIT DIVISION: 0
UNIT DIVISION: 0
Unit division: 0

## 2013-07-01 ENCOUNTER — Non-Acute Institutional Stay (HOSPITAL_COMMUNITY)
Admission: AD | Admit: 2013-07-01 | Discharge: 2013-07-01 | Disposition: A | Discharge status: Home / Self Care | Payer: Medicare Other | Attending: Family Medicine | Admitting: Family Medicine

## 2013-07-01 ENCOUNTER — Telehealth (HOSPITAL_COMMUNITY): Payer: Self-pay | Admitting: Internal Medicine

## 2013-07-01 ENCOUNTER — Other Ambulatory Visit (HOSPITAL_COMMUNITY): Payer: Self-pay | Admitting: Family Medicine

## 2013-07-01 ENCOUNTER — Encounter (HOSPITAL_COMMUNITY): Payer: Self-pay

## 2013-07-01 DIAGNOSIS — F19939 Other psychoactive substance use, unspecified with withdrawal, unspecified: Secondary | ICD-10-CM | POA: Insufficient documentation

## 2013-07-01 DIAGNOSIS — E876 Hypokalemia: Secondary | ICD-10-CM | POA: Insufficient documentation

## 2013-07-01 DIAGNOSIS — Z87891 Personal history of nicotine dependence: Secondary | ICD-10-CM | POA: Insufficient documentation

## 2013-07-01 DIAGNOSIS — I10 Essential (primary) hypertension: Secondary | ICD-10-CM | POA: Insufficient documentation

## 2013-07-01 DIAGNOSIS — F1120 Opioid dependence, uncomplicated: Secondary | ICD-10-CM | POA: Insufficient documentation

## 2013-07-01 DIAGNOSIS — D5700 Hb-SS disease with crisis, unspecified: Secondary | ICD-10-CM | POA: Insufficient documentation

## 2013-07-01 DIAGNOSIS — D72829 Elevated white blood cell count, unspecified: Secondary | ICD-10-CM | POA: Insufficient documentation

## 2013-07-01 DIAGNOSIS — D696 Thrombocytopenia, unspecified: Secondary | ICD-10-CM

## 2013-07-01 DIAGNOSIS — Z79899 Other long term (current) drug therapy: Secondary | ICD-10-CM | POA: Insufficient documentation

## 2013-07-01 DIAGNOSIS — D473 Essential (hemorrhagic) thrombocythemia: Secondary | ICD-10-CM | POA: Insufficient documentation

## 2013-07-01 DIAGNOSIS — Z86711 Personal history of pulmonary embolism: Secondary | ICD-10-CM | POA: Insufficient documentation

## 2013-07-01 DIAGNOSIS — Z885 Allergy status to narcotic agent status: Secondary | ICD-10-CM | POA: Insufficient documentation

## 2013-07-01 DIAGNOSIS — E860 Dehydration: Secondary | ICD-10-CM | POA: Insufficient documentation

## 2013-07-01 DIAGNOSIS — Z7901 Long term (current) use of anticoagulants: Secondary | ICD-10-CM | POA: Insufficient documentation

## 2013-07-01 LAB — COMPREHENSIVE METABOLIC PANEL
ALBUMIN: 3.7 g/dL (ref 3.5–5.2)
ALT: 20 U/L (ref 0–53)
AST: 22 U/L (ref 0–37)
Alkaline Phosphatase: 78 U/L (ref 39–117)
BILIRUBIN TOTAL: 2.4 mg/dL — AB (ref 0.3–1.2)
BUN: 6 mg/dL (ref 6–23)
CALCIUM: 8.8 mg/dL (ref 8.4–10.5)
CO2: 23 meq/L (ref 19–32)
CREATININE: 0.53 mg/dL (ref 0.50–1.35)
Chloride: 105 mEq/L (ref 96–112)
GFR calc Af Amer: >90 mL/min (ref >90)
GFR calc non Af Amer: >90 mL/min (ref >90)
GLUCOSE: 86 mg/dL (ref 70–99)
POTASSIUM: 3.3 meq/L — AB (ref 3.7–5.3)
Sodium: 139 mEq/L (ref 137–147)
Total Protein: 7.5 g/dL (ref 6.0–8.3)

## 2013-07-01 LAB — RETICULOCYTES
RBC.: 2.3 MIL/uL — ABNORMAL LOW (ref 4.22–5.81)
Retic Count, Absolute: 64.4 10*3/uL (ref 19.0–186.0)
Retic Ct Pct: 2.8 % (ref 0.4–3.1)

## 2013-07-01 LAB — CBC WITH DIFFERENTIAL/PLATELET
Basophils Absolute: 0.2 10*3/uL — ABNORMAL HIGH (ref 0.0–0.1)
Basophils Relative: 1 % (ref 0–1)
EOS ABS: 0.5 10*3/uL (ref 0.0–0.7)
EOS PCT: 2 % (ref 0–5)
HCT: 20.8 % — ABNORMAL LOW (ref 39.0–52.0)
HEMOGLOBIN: 7 g/dL — AB (ref 13.0–17.0)
Lymphocytes Relative: 17 % (ref 12–46)
Lymphs Abs: 3.5 10*3/uL (ref 0.7–4.0)
MCH: 30.4 pg (ref 26.0–34.0)
MCHC: 33.7 g/dL (ref 30.0–36.0)
MCV: 90.4 fL (ref 78.0–100.0)
MONO ABS: 1.7 10*3/uL — AB (ref 0.1–1.0)
MONOS PCT: 9 % (ref 3–12)
Neutro Abs: 14.4 10*3/uL — ABNORMAL HIGH (ref 1.7–7.7)
Neutrophils Relative %: 71 % (ref 43–77)
Platelets: 634 10*3/uL — ABNORMAL HIGH (ref 150–400)
RBC: 2.3 MIL/uL — ABNORMAL LOW (ref 4.22–5.81)
RDW: 17 % — ABNORMAL HIGH (ref 11.5–15.5)
WBC: 20.2 10*3/uL — ABNORMAL HIGH (ref 4.0–10.5)

## 2013-07-01 MED ORDER — ONDANSETRON HCL 4 MG/2ML IJ SOLN: 4.0000 mg | INTRAMUSCULAR | Status: DC | PRN

## 2013-07-01 MED ORDER — HYDROMORPHONE HCL PF 2 MG/ML IJ SOLN
1.8500 mg | Freq: Once | INTRAMUSCULAR | Status: AC
  Administered 2013-07-01: 1.85 mg via INTRAVENOUS
  Filled 2013-07-01: qty 1

## 2013-07-01 MED ORDER — HYDROMORPHONE HCL PF 2 MG/ML IJ SOLN
2.8700 mg | Freq: Once | INTRAMUSCULAR | Status: AC | PRN
  Administered 2013-07-01: 2.87 mg via INTRAVENOUS
  Filled 2013-07-01: qty 2

## 2013-07-01 MED ORDER — KETOROLAC TROMETHAMINE 15 MG/ML IJ SOLN
15.0000 mg | Freq: Once | INTRAMUSCULAR | Status: AC
  Administered 2013-07-01: 15 mg via INTRAVENOUS
  Filled 2013-07-01: qty 1

## 2013-07-01 MED ORDER — SODIUM CHLORIDE 0.9 % IJ SOLN: 9.0000 mL | INTRAMUSCULAR | Status: DC | PRN

## 2013-07-01 MED ORDER — ONDANSETRON HCL 4 MG PO TABS: 4.0000 mg | ORAL_TABLET | ORAL | Status: DC | PRN

## 2013-07-01 MED ORDER — POTASSIUM CHLORIDE CRYS ER 20 MEQ PO TBCR
40.0000 meq | EXTENDED_RELEASE_TABLET | Freq: Once | ORAL | Status: AC
  Administered 2013-07-01: 40 meq via ORAL
  Filled 2013-07-01: qty 2

## 2013-07-01 MED ORDER — RIVAROXABAN 20 MG PO TABS
20.0000 mg | ORAL_TABLET | Freq: Every day | ORAL | Status: DC
  Filled 2013-07-01: qty 1

## 2013-07-01 MED ORDER — SODIUM CHLORIDE 0.9 % IV SOLN
25.0000 mg | INTRAVENOUS | Status: DC | PRN
  Filled 2013-07-01: qty 0.5

## 2013-07-01 MED ORDER — DIPHENHYDRAMINE HCL 25 MG PO CAPS
25.0000 mg | ORAL_CAPSULE | ORAL | Status: DC | PRN
  Administered 2013-07-01: 25 mg via ORAL
  Filled 2013-07-01: qty 1

## 2013-07-01 MED ORDER — MORPHINE SULFATE ER 15 MG PO TBCR
15.0000 mg | EXTENDED_RELEASE_TABLET | Freq: Two times a day (BID) | ORAL | Status: DC
  Administered 2013-07-01: 15 mg via ORAL
  Filled 2013-07-01: qty 1

## 2013-07-01 MED ORDER — NALOXONE HCL 0.4 MG/ML IJ SOLN: 0.4000 mg | INTRAMUSCULAR | Status: DC | PRN

## 2013-07-01 MED ORDER — SODIUM CHLORIDE 0.9 % IJ SOLN
10.0000 mL | INTRAMUSCULAR | Status: AC | PRN
  Administered 2013-07-01: 10 mL

## 2013-07-01 MED ORDER — HYDROMORPHONE HCL PF 2 MG/ML IJ SOLN
2.3000 mg | INTRAMUSCULAR | Status: AC | PRN
  Administered 2013-07-01: 2.3 mg via INTRAVENOUS
  Filled 2013-07-01: qty 2

## 2013-07-01 MED ORDER — POTASSIUM CHLORIDE CRYS ER 20 MEQ PO TBCR: 20.0000 meq | EXTENDED_RELEASE_TABLET | Freq: Every morning | ORAL | Status: DC

## 2013-07-01 MED ORDER — HEPARIN SOD (PORK) LOCK FLUSH 100 UNIT/ML IV SOLN
500.0000 [IU] | INTRAVENOUS | Status: AC | PRN
  Administered 2013-07-01: 500 [IU]
  Filled 2013-07-01: qty 5

## 2013-07-01 MED ORDER — FOLIC ACID 1 MG PO TABS: 1.0000 mg | ORAL_TABLET | Freq: Every morning | ORAL | Status: DC

## 2013-07-01 MED ORDER — METOPROLOL TARTRATE 25 MG PO TABS: 25.0000 mg | ORAL_TABLET | Freq: Two times a day (BID) | ORAL | Status: DC

## 2013-07-01 MED ORDER — HYDROMORPHONE 0.3 MG/ML IV SOLN
INTRAVENOUS | Status: DC
  Administered 2013-07-01: 4.8 mg via INTRAVENOUS
  Administered 2013-07-01 (×2): via INTRAVENOUS
  Administered 2013-07-01: 6.88 mg via INTRAVENOUS
  Filled 2013-07-01 (×2): qty 25

## 2013-07-01 MED ORDER — DEXTROSE-NACL 5-0.45 % IV SOLN
INTRAVENOUS | Status: DC
  Administered 2013-07-01: 14:00:00 via INTRAVENOUS

## 2013-07-01 MED ORDER — FOLIC ACID 1 MG PO TABS: 1.0000 mg | ORAL_TABLET | Freq: Every day | ORAL | Status: DC

## 2013-07-01 MED ORDER — HYDROXYUREA 500 MG PO CAPS
1000.0000 mg | ORAL_CAPSULE | Freq: Every day | ORAL | Status: DC
Start: 2013-07-01 — End: 2013-07-01

## 2013-07-01 MED ORDER — HYDROMORPHONE HCL 4 MG PO TABS
4.0000 mg | ORAL_TABLET | Freq: Once | ORAL | Status: AC
  Administered 2013-07-01: 4 mg via ORAL
  Filled 2013-07-01: qty 1

## 2013-07-01 NOTE — Telephone Encounter (Signed)
Spoke with patient and communicated to pt that, per MD, he may come to day hospital for evaluation; pt verbalizes understanding

## 2013-07-01 NOTE — H&P (Signed)
[place] History and Physical  CANDIDO FLOTT ZTI:[id number] DOB: 1980/09/20 DOA: 07/01/2013   PCP: [name],[name], MD   Chief Complaint: pain in rib cage  HPI: [name] is a 33 y.o. male with sickle cell anemia and history of pulmonary embolus on chronic anticoagulation who was recently discharged from the hospital.  He says that he has been without his regular pain medications for the last several days.  He has been without his short-acting pain meds for about a week and then reports that he has been out of his long-acting pain meds for 2 days.  He says the pain in his ribs is typical of acute sickle crisis episodes that he has had in the past.  He denies nausea and vomiting.  He denies abdominal pain.  He denies fever and chills.     Review of Systems:  Constitutional: No weight loss, night sweats, Fevers, chills, fatigue.  HEENT: No headaches, dizziness, seizures, vision changes, difficulty swallowing,Tooth/dental problems,Sore throat, No sneezing, itching, ear ache, nasal congestion, post nasal drip,  Cardio-vascular: No chest pain, Orthopnea, PND, swelling in lower extremities, anasarca, dizziness, palpitations  GI: No heartburn, indigestion, abdominal pain, nausea, vomiting, diarrhea, change in bowel habits, loss of appetite  Resp: No shortness of breath with exertion or at rest. No excess mucus, no productive cough, No non-productive cough, No coughing up of blood.No change in color of mucus.No wheezing.No chest wall deformity  Skin: no rash or lesions.  GU: no dysuria, change in color of urine, no urgency or frequency. No flank pain.  Musculoskeletal: Positive pain in rib cage.  No joint pain or swelling. No decreased range of motion. No back pain.  Psych: No change in mood or affect. No depression or anxiety. No memory loss.   Past Medical History  Diagnosis Date  . Sickle cell anemia   . Blood transfusion   . Acute embolism and thrombosis of  right internal jugular vein   . Hypokalemia   . Mood disorder   . History of pulmonary embolus (PE)   . Avascular necrosis   . Leukocytosis     Chronic  . Thrombocytosis     Chronic  . Hypertension   . History of Clostridium difficile infection   . Uses marijuana   . Chronic anticoagulation   . Functional asplenia   . Former smoker   . Second hand tobacco smoke exposure   . Alcohol consumption of one to four drinks per day    Past Surgical History  Procedure Laterality Date  . Right hip replacement      08/2006  . Cholecystectomy      01/2008  . Porta cath placement    . Porta cath removal    . Umbilical hernia repair      01/2008  . Excision of left periauricular cyst      10/2009  . Excision of right ear lobe cyst with primary closur      11/2007  . Portacath placement  01/05/2012    Procedure: INSERTION PORT-A-CATH;  Surgeon: Odis Hollingshead, MD;  Location: [place];  Service: General;  Laterality: N/A;  ultrasound guiced port a cath insertion with fluoroscopy   Social History:  reports that he quit smoking about 2 years ago. His smoking use included Cigarettes. He smoked 0.00 packs per day for 13 years. He has never used smokeless tobacco. He reports that he drinks about 3.6 ounces of alcohol per week. He reports that he uses  illicit drugs (Marijuana) about twice per week.  Allergies  Allergen Reactions  . Morphine And Related Hives and Rash    Pt states he can take PO, but not IV and he is able to tolerate Dilaudid with no reactions.    Family History  Problem Relation Age of Onset  . Sickle cell anemia Mother   . Sickle cell anemia Father   . Sickle cell trait Brother     Prior to Admission medications   Medication Sig Start Date End Date Taking? Authorizing Provider  folic acid (FOLVITE) 1 MG tablet Take 1 tablet (1 mg total) by mouth every morning. 06/05/12  Yes [name], MD  HYDROmorphone (DILAUDID) 4 MG tablet Take 1 tablet (4 mg total) by mouth  every 4 (four) hours as needed for severe pain. 06/27/13  Yes Dorena Dew, FNP  hydroxyurea (HYDREA) 500 MG capsule Take 1,000 mg by mouth daily. May take with food to minimize GI side effects. 03/30/13  Yes Elwyn Reach, MD  levofloxacin (LEVAQUIN) 750 MG tablet Take 1 tablet (750 mg total) by mouth daily. 06/20/13  Yes Elwyn Reach, MD  lisinopril (PRINIVIL,ZESTRIL) 10 MG tablet Take 1 tablet (10 mg total) by mouth daily. 06/20/13  Yes Elwyn Reach, MD  metoprolol tartrate (LOPRESSOR) 25 MG tablet Take 1 tablet (25 mg total) by mouth 2 (two) times daily. 06/20/13  Yes Elwyn Reach, MD  morphine (MS CONTIN) 15 MG 12 hr tablet Take 1 tablet (15 mg total) by mouth 2 (two) times daily. 06/27/13  Yes Dorena Dew, FNP  potassium chloride SA (K-DUR,KLOR-CON) 20 MEQ tablet Take 1 tablet (20 mEq total) by mouth every morning. 03/30/13  Yes Elwyn Reach, MD  Rivaroxaban (XARELTO) 20 MG TABS tablet Take 20 mg by mouth daily with supper.   Yes Historical Provider, MD  zolpidem (AMBIEN) 10 MG tablet Take 1 tablet (10 mg total) by mouth at bedtime as needed for sleep. 06/27/13  Yes Dorena Dew, FNP   Physical Exam: Filed Vitals:   07/01/13 1221 07/01/13 1447  BP: 115/63   Pulse: 78   Temp: 97.9 F (36.6 C)   TempSrc: Oral   Resp: 18 23  Weight: 165 lb (74.844 kg)   SpO2: 100% 97%   Constitutional: Appears well-developed and well-nourished. No distress.  HENT: Normocephalic. External right and left ear normal.  Eyes: Conjunctivae and EOM are normal. PERRLA, pronounced bilateral scleral icterus.  Neck: Normal ROM. Neck supple. No JVD. No tracheal deviation.  CVS: normal S1/S2 sounds, no murmurs, no gallops, no carotid bruit.  Pulmonary: Effort and breath sounds normal, no stridor, rhonchi, wheezes, rales.  Chest: tenderness rib cage with palpation, no bruising seen. Normal expansion of chest wall with breathing.  Abdominal: Soft. BS +, no distension, tenderness, rebound or  guarding.  Musculoskeletal: Normal range of motion. Bilateral flank tenderness with palpation involving muscles of lower back. No bony tenderness of lumbar spine.  Lymphadenopathy: No lymphadenopathy noted, cervical, inguinal. Neuro: Alert. Normal reflexes, muscle tone coordination. No cranial nerve deficit.  Skin: Skin is warm and dry.  Psychiatric: Normal mood and affect.  Labs on Admission:   Basic Metabolic Panel:  Recent Labs Lab 06/26/13 0530 06/27/13 0500 07/01/13 1317  NA 137 135* 139  K 3.6* 3.9 3.3*  CL 102 103 105  CO2 21 21 23   GLUCOSE 119* 101* 86  BUN 24* 11 6  CREATININE 0.76 0.56 0.53  CALCIUM 9.1 8.9 8.8  Liver Function Tests:  Recent Labs Lab 06/26/13 0530 07/01/13 1317  AST 53* 22  ALT 29 20  ALKPHOS 91 78  BILITOT 7.0* 2.4*  PROT 7.8 7.5  ALBUMIN 4.1 3.7   CBC:  Recent Labs Lab 06/26/13 0530 06/26/13 1743 06/27/13 0500 07/01/13 1317  WBC 16.9*  --  17.8* 20.2*  NEUTROABS 9.6*  --   --  14.4*  HGB 5.7* 8.0* 7.9* 7.0*  HCT 16.4* 23.2* 23.9* 20.8*  MCV 91.1  --  89.5 90.4  PLT 503*  --  484* 634*   BNP: No components found with this basename: POCBNP,    Radiological Exams on Admission: No results found.  Assessment/Plan: Active Problems:   Sickle cell crisis   Hypokalemia    Mild dehydration    Opioid withdrawal    Thrombocytosis    Leukocytosis  Admit to extended observation.  Check CBC, CMP.  Treat hypokalemia with oral potassium.  Start hypotonic IVFs. Resume home long-acting pain medication.  Start dilaudid weight-based rapid re-dosing.  If pain level 7 or greater after 3rd dose then initiate dilaudid PCA (individualized weight based dosing).  Start adjunctive therapy with K-pad.  Initiate IV toradol injection for inflammation treatment.  Resume home hydrea.  Monitor closely.    Time spend: 38 mins  Clanford Marisa Hua, MD  Pager [phone]  If 7PM-7AM, please contact night-coverage [email or web address] Password [email or web address] 07/01/2013,  3:17 PM

## 2013-07-01 NOTE — Progress Notes (Signed)
Pharmacy - Brief Note for Hydroxyurea  Hydroxyurea (Hydrea) hold criteria:  ANC < 2  Pltc < 80K in sickle-cell patients; < 100K in other patients  Hgb <= 6 in sickle-cell patients; < 8 in other patients  Reticulocytes < 80K when Hgb < 9  A/P:  Current reticulocyte count = 64.4 (Hgb = 7)  Order for Hydroxyurea has been removed from active medication list for meeting above hold criteria  Please resume once labs appropriate  Doreene Eland, PharmD, BCPS.   Pager: [phone] 07/01/2013 2:32 PM

## 2013-07-01 NOTE — Discharge Instructions (Signed)
Hold Hydroxyurea. Return to clinic on 07/04/2013 for labs.

## 2013-07-01 NOTE — Telephone Encounter (Signed)
Pt states having pain in legs, ribs, and back; states pain is 8/10 with no relief from home medications; Pt denies N/V/D, SOB, CP, or priapism; pt states took home medications; MD notified

## 2013-07-01 NOTE — Progress Notes (Signed)
Patient ID: [name], male   DOB: [birth year], 33 y.o.   MRN: [id number] Pt discharged to home; discharge instructions explained, given, and signed; all questions answered; pt alert, oriented, and ambulatory; no complications noted

## 2013-07-01 NOTE — Discharge Summary (Signed)
[facility] Discharge Summary   Patient ID: MONTREY BUIST MRN: [id number] DOB/AGE: 1980/05/10 33 y.o.  Admit date: 07/01/2013 Discharge date: 07/01/2013  Primary Care Physician:  [name],[name], MD  Admission Diagnoses:  Active Problems:   Sickle cell crisis   Discharge Diagnoses:   Sickle cell crisis  Discharge Medications:    Medication List    ASK your doctor about these medications       folic acid 1 MG tablet  Commonly known as:  FOLVITE  Take 1 tablet (1 mg total) by mouth every morning.     HYDROmorphone 4 MG tablet  Commonly known as:  DILAUDID  Take 1 tablet (4 mg total) by mouth every 4 (four) hours as needed for severe pain.     hydroxyurea 500 MG capsule  Commonly known as:  HYDREA  Take 1,000 mg by mouth daily. May take with food to minimize GI side effects.     levofloxacin 750 MG tablet  Commonly known as:  LEVAQUIN  Take 1 tablet (750 mg total) by mouth daily.     lisinopril 10 MG tablet  Commonly known as:  PRINIVIL,ZESTRIL  Take 1 tablet (10 mg total) by mouth daily.     metoprolol tartrate 25 MG tablet  Commonly known as:  LOPRESSOR  Take 1 tablet (25 mg total) by mouth 2 (two) times daily.     morphine 15 MG 12 hr tablet  Commonly known as:  MS CONTIN  Take 1 tablet (15 mg total) by mouth 2 (two) times daily.     potassium chloride SA 20 MEQ tablet  Commonly known as:  K-DUR,KLOR-CON  Take 1 tablet (20 mEq total) by mouth every morning.     XARELTO 20 MG Tabs tablet  Generic drug:  rivaroxaban  Take 20 mg by mouth daily with supper.     zolpidem 10 MG tablet  Commonly known as:  AMBIEN  Take 1 tablet (10 mg total) by mouth at bedtime as needed for sleep.         Consults:  None  Significant Diagnostic Studies:  Dg Chest 2 View  06/26/2013   CLINICAL DATA:  Sickle cell disease.  Mid upper chest pain.  EXAM: CHEST  2 VIEW  COMPARISON:  Chest radiograph performed 06/18/2013  FINDINGS: The lungs are  well-aerated. Mild vascular congestion is noted. A small peripheral focus of airspace opacity at the right midlung zone is again seen. This may reflect infection or possibly changes of acute chest syndrome. There is no evidence of pleural effusion or pneumothorax.  The heart is mildly enlarged. A left-sided chest port is noted ending about the distal SVC. No acute osseous abnormalities are seen. Clips are noted within the right upper quadrant, reflecting prior cholecystectomy.  IMPRESSION: Mild vascular congestion and mild cardiomegaly noted. Small peripheral focus of airspace opacity again noted at the right midlung zone, which may reflect infection or possibly changes of acute chest syndrome. This is slightly less prominent than on the prior study.   Electronically Signed   By: Garald Balding M.D.   On: 06/26/2013 06:31   Dg Chest 2 View  06/17/2013   CLINICAL DATA:  Pain, sickle cell crisis  EXAM: CHEST  2 VIEW  COMPARISON:  06/02/2013  FINDINGS: Cardiac shadow is stable. A left chest wall port is again seen in satisfactory position. Patchy nodular changes are noted particularly in the right lung base similar to that seen on recent CT examination. No new focal  infiltrate or sizable effusion is noted.  IMPRESSION: Stable right basilar parenchymal nodules.   Electronically Signed   By: Inez Catalina M.D.   On: 06/17/2013 07:42   Dg Chest 2 View  06/02/2013   CLINICAL DATA:  Sickle cell crisis.  Shortness of breath.  EXAM: CHEST  2 VIEW  COMPARISON:  05/14/2013  FINDINGS: Left chest wall port a catheter is noted with tip in the cavoatrial junction. The heart size is enlarged. The lung volumes are low and there is scarring within both lung bases. No pleural effusions or edema. No airspace consolidation.  IMPRESSION: 1. Low lung volumes with bibasilar scarring.  . 2. Cardiac enlargement.   Electronically Signed   By: Kerby Moors M.D.   On: 06/02/2013 09:36   Ct Angio Chest Pe W/cm &/or Wo Cm  06/17/2013    CLINICAL DATA:  33 year old male with shortness of breath, hypoxia and sickle cell disease.  EXAM: CT ANGIOGRAPHY CHEST WITH CONTRAST  TECHNIQUE: Multidetector CT imaging of the chest was performed using the standard protocol during bolus administration of intravenous contrast. Multiplanar CT image reconstructions and MIPs were obtained to evaluate the vascular anatomy.  CONTRAST:  162mL OMNIPAQUE IOHEXOL 350 MG/ML SOLN  COMPARISON:  06/02/2013 and prior CTs  FINDINGS: This is a technically satisfactory study.  No pulmonary emboli are identified.  Cardiomegaly is again noted.  There is no evidence of thoracic aortic aneurysm or definite dissection.  Multiple shotty and mildly prominent mediastinal lymph nodes are unchanged.  A miniscule right pleural effusion is now noted.  A 1.4 x 1.9 cm right lower lobe peripheral area of focal consolidation is again noted.  A 0.8 x 1.8 cm inferior right upper lobe peripheral consolidation/airspace opacity is now identified.  He other faint areas of peripheral ground-glass opacity within the right middle lobe, lingula and right upper lobe are identified.  Mild bibasilar atelectasis/scarring is present.  There is no evidence of pneumothorax.  A calcified auto infarcted infarcted spleen is present.  Bony changes of sickle cell disease are noted including bilateral humeral head AVN.  A left-sided Port-A-Cath is present.  Review of the MIP images confirms the above findings.  IMPRESSION: No evidence of pulmonary emboli or thoracic aortic aneurysm.  Nonspecific peripheral areas of ground-glass/airspace opacity and focal consolidation as described above. This may represent pneumonia, atelectasis or acute chest syndrome changes. The right lower lobe area of focal consolidation is stable to minimally increased.  New miniscule right pleural effusion.  Cardiomegaly with splenic and bony changes of sickle cell disease.   Electronically Signed   By: Hassan Rowan M.D.   On: 06/17/2013 19:09   Ct  Angio Chest Pe W/cm &/or Wo Cm  06/02/2013   CLINICAL DATA:  Shortness of breath, chest pain, hypoxia, tachycardia, history of sickle cell anemia, evaluate for pulmonary embolism  EXAM: CT ANGIOGRAPHY CHEST WITH CONTRAST  TECHNIQUE: Multidetector CT imaging of the chest was performed using the standard protocol during bolus administration of intravenous contrast. Multiplanar CT image reconstructions and MIPs were obtained to evaluate the vascular anatomy.  CONTRAST:  170mL OMNIPAQUE IOHEXOL 350 MG/ML SOLN  COMPARISON:  CT ANGIO CHEST W/CM &/OR WO/CM dated 05/18/2013; CT ANGIO CHEST W/CM &/OR WO/CM dated 03/26/2013; CT ANGIO CHEST W/CM &/OR WO/CM dated 11/13/2012; CT ANGIO CHEST W/CM &/OR WO/CM dated 08/27/2012  FINDINGS: Vascular Findings:  There is suboptimal adequate opacification of the pulmonary arterial system with the main pulmonary artery measuring only 187 Hounsfield units. Given this limitation, there  are no discrete filling defects within the pulmonary arterial tree to the level of the bilateral subsegmental pulmonary arteries. Evaluation of distal subsegmental pulmonary arteries is degraded secondary to suboptimal vessel opacification and patient respiratory artifact. There is grossly unchanged marked enlargement of the main pulmonary artery measuring approximately 38 mm in diameter.  Cardiomegaly. No pericardial effusion. Normal caliber of the thoracic aorta. A Bentson configuration of the aortic arch. Branch vessels of the aortic arch widely patent throughout their imaged course. No definite thoracic aortic dissection or periaortic stranding.  There is a left anterior chest wall subclavian vein approach port a catheter with tip terminating within the superior aspect of the right atrium.  Review of the MIP images confirms the above findings.   ----------------------------------------------------------------------------------  Nonvascular Findings:  Evaluation of the pulmonary parenchyma is degraded  secondary to patient respiratory artifact.  Interval development / worsening of a ill-defined potentially partially cavitary approximately 2.0 x 1.4 cm opacity within the peripheral aspect of the superior segment of the right lower lobe (image 67, series 9). There is an additional ill-defined area of ground-glass within the subpleural aspect of the right lower lobe (image 65, series 9).  There is grossly symmetric subpleural dependent subsegmental atelectasis within the bilateral lower lobes and caudal segment of the lingula. No pleural effusion or pneumothorax. The central pulmonary airways appear widely patent. Shotty prevascular lymph nodes are individually not enlarged by size criteria with index prevascular lymph node measuring approximately 0.9 cm in greatest short axis diameter (image 46, series 6). No mediastinal, hilar or axillary lymphadenopathy.  Early arterial phase evaluation of the upper abdomen is normal.  No definite acute or aggressive osseous abnormalities. Accentuated kyphosis within in the superior aspect of the thoracic spine, likely positional. Stigmata of sickle cell disease affecting the imaged osseous structures of the chest including mild diffuse increased slightly mottled sclerosis and fish-mouth deformities about multiple intervertebral disc spaces within in the mid cranial and mid aspects of the thoracic spine.  Thyroid gland appears normal given obliquity. Regional soft tissues appear normal.  IMPRESSION: 1. No evidence of pulmonary embolism to the level of the bilateral subsegmental pulmonary arteries. 2. Interval development of airspace opacities within the right lower and to a lesser extent, the right middle lobes, worrisome for developing areas of infection and/or acute chest syndrome in this patient with known sickle cell disease. 3. Cardiomegaly with enlargement of the caliber of the main pulmonary artery, nonspecific though could be seen in the setting of pulmonary arterial  hypertension. Further evaluation could be performed with cardiac echo as clinically indicated.   Electronically Signed   By: Sandi Mariscal M.D.   On: 06/02/2013 12:02   Nm Pulmonary Perf And Vent  06/19/2013   CLINICAL DATA:  34 year old male with pulmonary hypertension. History of sickle cell disease. Pulmonary embolus. Initial encounter.  EXAM: NUCLEAR MEDICINE VENTILATION - PERFUSION LUNG SCAN  TECHNIQUE: Ventilation images were obtained in multiple projections using inhaled aerosol technetium 99 M DTPA. Perfusion images were obtained in multiple projections after intravenous injection of Tc-72m MAA.  RADIOPHARMACEUTICALS:  43.9 MCi Tc-54m DTPA aerosol and 5.3 mCi Tc-68m MAA  COMPARISON:  Chest CTA 06/17/2013. Portable chest radiograph 06/18/2013.  FINDINGS: Ventilation: Photopenia related to cardiomegaly. No ventilation defect.  Perfusion: Defects are identified, however, there is heterogeneous peripheral perfusion activity in both lungs, with multiple small peripheral wedge-shaped defects bilaterally.  IMPRESSION: Multifocal small areas of abnormal peripheral lung profusion bilaterally. 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  Electronically Signed   By: Lars Pinks M.D.   On: 06/19/2013 11:02   Dg Chest Port 1 View  06/18/2013   CLINICAL DATA:  33 year old male with sickle cell anemia. Hypoxemia. Initial encounter.  EXAM: PORTABLE CHEST - 1 VIEW  COMPARISON:  Chest CTA 06/17/2013 and earlier.  FINDINGS: Portable AP semi upright view at 0510 hrs. Left chest Port-A-Cath re-identified, now accessed. Mildly lower lung volumes. Stable cardiomegaly and mediastinal contours. Visualized tracheal air column is within normal limits. No pneumothorax. No pulmonary edema. Mild peripheral nodular pulmonary opacity, most conspicuous in the right lung, is stable. Overall ventilation not significantly changed.  IMPRESSION: Stable peripheral nodular pulmonary opacity, most conspicuous in the  right lung, characterized by chest CTA yesterday.   Electronically Signed   By: Lars Pinks M.D.   On: 06/18/2013 07:13     [facility] Course:  [facility]. Checked CBC, platelet count increased, will hold hydroxyurea 500 mg on discharge per pharmacy. Patient will return to clinic for labs on 07/04/2013 to monitor thrombocytosis. Treated hypokalemia with oral potassium as previously prescribed. Start hypotonic IVFs for cellular rehydration. Resume home long-acting pain medication, patient was unable to pick up prescriptions on last week due to job constraints. Started IV Toradol and IV dilaudid per weight-based rapid re-dosing. Pain intensity remained greater than 7/10, started PCA dilaudid. Patient used a total of 11.68 mg with 15 demands and 15 deliveries.  Patient was given Dilaudid 4 mg po 40 minutes prior to discharge. Maintains that pain intensity is 5/10 and he reports that he is able to function at home. Patient is alert, oriented and ambulatory. Patient to follow up in office as discussed. Discussed specialty appointments, patient reports that he is aware of appt times. Discharged home with brother in stable condition.     Physical Exam at Discharge:  BP 120/62  Pulse 73  Temp(Src) 98 F (36.7 C) (Oral)  Resp 22  Wt 165 lb (74.844 kg)  SpO2 99%  Constitutional: Appears well-developed and well-nourished. No distress.  HENT: Normocephalic. External right and left ear normal.  Eyes: Conjunctivae and EOM are normal. PERRLA, pronounced bilateral scleral icterus.  CVS: Murmur present, no gallops, no carotid bruit.  Pulmonary: Effort and breath sounds normal, no stridor, rhonchi, wheezes, rales.  Chest: tenderness rib cage with deep palpation, no bruising seen. Normal expansion of chest wall with breathing.  Abdominal: Soft. No tenderness Neuro: Alert. Normal reflexes, muscle tone coordination. No cranial nerve  deficit.      Disposition at Discharge: 01-Home or Self Care  Discharge Orders:   Condition at Discharge:   Stable  Time spent on Discharge:  Greater than 30 minutes.  Signed: Dorena Dew 07/01/2013, 5:32 PM

## 2013-07-02 NOTE — Discharge Summary (Signed)
Pt's pain intensity required initiation of dilaudid PCA.  He was monitored closely and pain gradually began to improve.  Unfortunately this patient has been without his regular maintenance pain medications for several days and I believe that there was a component of opioid withdrawal exacerbating his current symptoms.   He was restarted on his long acting medication and received his pain medication prescriptions today.  I am having him to hold his hydroxyurea due to concerns about his reticulocyte count and with consultation with the Pharm D and his PCP Dr. Zigmund Daniel.  He was given instructions to return to clinic on 6/4 to have labs checked and to address resuming his hydroxyurea.  The patient verbalized understanding.    Gerlene Fee, MD, CDE, [place], [place]

## 2013-07-04 ENCOUNTER — Ambulatory Visit: Payer: Self-pay | Admitting: Internal Medicine

## 2013-07-10 ENCOUNTER — Non-Acute Institutional Stay (HOSPITAL_COMMUNITY)
Admission: AD | Admit: 2013-07-10 | Discharge: 2013-07-10 | Disposition: A | Discharge status: Home / Self Care | Payer: Medicare Other | Source: Ambulatory Visit | Attending: Internal Medicine | Admitting: Internal Medicine

## 2013-07-10 ENCOUNTER — Encounter: Payer: Self-pay | Admitting: Family Medicine

## 2013-07-10 ENCOUNTER — Telehealth (HOSPITAL_COMMUNITY): Payer: Self-pay | Admitting: Hematology

## 2013-07-10 ENCOUNTER — Other Ambulatory Visit: Payer: Self-pay | Admitting: Internal Medicine

## 2013-07-10 ENCOUNTER — Ambulatory Visit: Payer: Medicare Other | Admitting: Family Medicine

## 2013-07-10 ENCOUNTER — Telehealth: Payer: Self-pay

## 2013-07-10 VITALS — BP 121/79 | HR 81 | Temp 98.4°F | Resp 20 | Wt 168.0 lb

## 2013-07-10 DIAGNOSIS — E876 Hypokalemia: Secondary | ICD-10-CM | POA: Insufficient documentation

## 2013-07-10 DIAGNOSIS — Z87891 Personal history of nicotine dependence: Secondary | ICD-10-CM | POA: Insufficient documentation

## 2013-07-10 DIAGNOSIS — Z885 Allergy status to narcotic agent status: Secondary | ICD-10-CM | POA: Insufficient documentation

## 2013-07-10 DIAGNOSIS — D473 Essential (hemorrhagic) thrombocythemia: Secondary | ICD-10-CM | POA: Insufficient documentation

## 2013-07-10 DIAGNOSIS — Z79899 Other long term (current) drug therapy: Secondary | ICD-10-CM | POA: Insufficient documentation

## 2013-07-10 DIAGNOSIS — Z86711 Personal history of pulmonary embolism: Secondary | ICD-10-CM | POA: Diagnosis not present

## 2013-07-10 DIAGNOSIS — D72829 Elevated white blood cell count, unspecified: Secondary | ICD-10-CM | POA: Insufficient documentation

## 2013-07-10 DIAGNOSIS — I272 Pulmonary hypertension, unspecified: Secondary | ICD-10-CM

## 2013-07-10 DIAGNOSIS — D5700 Hb-SS disease with crisis, unspecified: Secondary | ICD-10-CM | POA: Diagnosis not present

## 2013-07-10 DIAGNOSIS — D75839 Thrombocytosis, unspecified: Secondary | ICD-10-CM

## 2013-07-10 DIAGNOSIS — I10 Essential (primary) hypertension: Secondary | ICD-10-CM | POA: Diagnosis not present

## 2013-07-10 DIAGNOSIS — E860 Dehydration: Secondary | ICD-10-CM | POA: Diagnosis not present

## 2013-07-10 DIAGNOSIS — F39 Unspecified mood [affective] disorder: Secondary | ICD-10-CM | POA: Diagnosis not present

## 2013-07-10 DIAGNOSIS — D571 Sickle-cell disease without crisis: Secondary | ICD-10-CM | POA: Diagnosis not present

## 2013-07-10 DIAGNOSIS — F1290 Cannabis use, unspecified, uncomplicated: Secondary | ICD-10-CM

## 2013-07-10 LAB — CBC WITH DIFFERENTIAL/PLATELET
Basophils Absolute: 0.1 10*3/uL (ref 0.0–0.1)
Basophils Relative: 1 % (ref 0–1)
EOS PCT: 3 % (ref 0–5)
Eosinophils Absolute: 0.4 10*3/uL (ref 0.0–0.7)
HEMATOCRIT: 24 % — AB (ref 39.0–52.0)
Hemoglobin: 8 g/dL — ABNORMAL LOW (ref 13.0–17.0)
LYMPHS ABS: 2.1 10*3/uL (ref 0.7–4.0)
LYMPHS PCT: 16 % (ref 12–46)
MCH: 31 pg (ref 26.0–34.0)
MCHC: 33.3 g/dL (ref 30.0–36.0)
MCV: 93 fL (ref 78.0–100.0)
Monocytes Absolute: 1.6 10*3/uL — ABNORMAL HIGH (ref 0.1–1.0)
Monocytes Relative: 13 % — ABNORMAL HIGH (ref 3–12)
Neutro Abs: 8.7 10*3/uL — ABNORMAL HIGH (ref 1.7–7.7)
Neutrophils Relative %: 67 % (ref 43–77)
Platelets: 694 10*3/uL — ABNORMAL HIGH (ref 150–400)
RBC: 2.58 MIL/uL — ABNORMAL LOW (ref 4.22–5.81)
RDW: 17.8 % — ABNORMAL HIGH (ref 11.5–15.5)
WBC: 13 10*3/uL — AB (ref 4.0–10.5)

## 2013-07-10 LAB — COMPREHENSIVE METABOLIC PANEL
ALT: 22 U/L (ref 0–53)
AST: 30 U/L (ref 0–37)
Albumin: 4.2 g/dL (ref 3.5–5.2)
Alkaline Phosphatase: 99 U/L (ref 39–117)
BUN: 8 mg/dL (ref 6–23)
CALCIUM: 9.3 mg/dL (ref 8.4–10.5)
CO2: 23 meq/L (ref 19–32)
Chloride: 106 mEq/L (ref 96–112)
Creatinine, Ser: 0.54 mg/dL (ref 0.50–1.35)
GLUCOSE: 105 mg/dL — AB (ref 70–99)
Potassium: 3.8 mEq/L (ref 3.7–5.3)
Sodium: 142 mEq/L (ref 137–147)
Total Bilirubin: 3.3 mg/dL — ABNORMAL HIGH (ref 0.3–1.2)
Total Protein: 8 g/dL (ref 6.0–8.3)

## 2013-07-10 LAB — RETICULOCYTES
RBC.: 2.58 MIL/uL — AB (ref 4.22–5.81)
RETIC COUNT ABSOLUTE: 209 10*3/uL — AB (ref 19.0–186.0)
Retic Ct Pct: 8.1 % — ABNORMAL HIGH (ref 0.4–3.1)

## 2013-07-10 MED ORDER — HYDROMORPHONE HCL PF 2 MG/ML IJ SOLN
2.3000 mg | Freq: Once | INTRAMUSCULAR | Status: AC
  Administered 2013-07-10: 2.3 mg via INTRAVENOUS
  Filled 2013-07-10: qty 2

## 2013-07-10 MED ORDER — HYDROMORPHONE HCL PF 2 MG/ML IJ SOLN
2.8000 mg | Freq: Once | INTRAMUSCULAR | Status: AC
  Administered 2013-07-10: 2.8 mg via INTRAVENOUS
  Filled 2013-07-10: qty 2

## 2013-07-10 MED ORDER — HYDROMORPHONE HCL PF 2 MG/ML IJ SOLN
1.9000 mg | Freq: Once | INTRAMUSCULAR | Status: AC
  Administered 2013-07-10: 1.9 mg via INTRAVENOUS
  Filled 2013-07-10: qty 1

## 2013-07-10 MED ORDER — KETOROLAC TROMETHAMINE 30 MG/ML IJ SOLN
30.0000 mg | Freq: Once | INTRAMUSCULAR | Status: AC
  Administered 2013-07-10: 30 mg via INTRAVENOUS
  Filled 2013-07-10: qty 1

## 2013-07-10 MED ORDER — HEPARIN SOD (PORK) LOCK FLUSH 100 UNIT/ML IV SOLN
500.0000 [IU] | INTRAVENOUS | Status: AC | PRN
  Administered 2013-07-10: 500 [IU]

## 2013-07-10 MED ORDER — DEXTROSE-NACL 5-0.45 % IV SOLN
INTRAVENOUS | Status: DC
  Administered 2013-07-10: 13:00:00 via INTRAVENOUS

## 2013-07-10 MED ORDER — SODIUM CHLORIDE 0.9 % IJ SOLN
10.0000 mL | INTRAMUSCULAR | Status: AC | PRN
  Administered 2013-07-10: 10 mL

## 2013-07-10 MED ORDER — HYDROMORPHONE HCL 4 MG PO TABS
4.0000 mg | ORAL_TABLET | Freq: Once | ORAL | Status: AC
  Administered 2013-07-10: 4 mg via ORAL
  Filled 2013-07-10: qty 1

## 2013-07-10 MED ORDER — SODIUM CHLORIDE 0.9 % IJ SOLN
INTRAMUSCULAR | Status: AC
  Filled 2013-07-10: qty 10

## 2013-07-10 NOTE — Progress Notes (Signed)
Patient ID: [name], male   DOB: 1980-12-16, 33 y.o.   MRN: [id number] Discharge instructions given to patient along with follow up information, port flushed and de-accessed per protocol.  Questions answered. Family here for transport.

## 2013-07-10 NOTE — Telephone Encounter (Signed)
Spoke with Dr. Zigmund Daniel. Called patient back and advised that he can come up to an office appointment at 11:00 first to be evaluated.  Patient verbalizes understanding.

## 2013-07-10 NOTE — Discharge Instructions (Signed)
Continue to hold hydroxyurea, will recheck during appt with Dr. Zigmund Daniel on 07/22/2013 Reschedule follow up appointment with cardiology  Sickle Cell Anemia, Adult Sickle cell anemia is a condition in which red blood cells have an abnormal "sickle" shape. This abnormal shape shortens the cells' life span, which results in a lower than normal concentration of red blood cells in the blood. The sickle shape also causes the cells to clump together and block free blood flow through the blood vessels. As a result, the tissues and organs of the body do not receive enough oxygen. Sickle cell anemia causes organ damage and pain and increases the risk of infection. CAUSES  Sickle cell anemia is a genetic disorder. Those who receive two copies of the gene have the condition, and those who receive one copy have the trait. RISK FACTORS The sickle cell gene is most common in people whose families originated in [place]. Other areas of the globe where sickle cell trait occurs include the [place], [place] and [place], and the [place].  SIGNS AND SYMPTOMS  Pain, especially in the extremities, back, chest, or abdomen (common). The pain may start suddenly or may develop following an illness, especially if there is dehydration. Pain can also occur due to overexertion or exposure to extreme temperature changes.  Frequent severe bacterial infections, especially certain types of pneumonia and meningitis.  Pain and swelling in the hands and feet.  Decreased activity.   Loss of appetite.   Change in behavior.  Headaches.  Seizures.  Shortness of breath or difficulty breathing.  Vision changes.  Skin ulcers. Those with the trait may not have symptoms or they may have mild symptoms.  DIAGNOSIS  Sickle cell anemia is diagnosed with blood tests that demonstrate the genetic trait. It is often diagnosed during the newborn period, due to mandatory testing nationwide. A variety of blood  tests, X-rays, CT scans, MRI scans, ultrasounds, and lung function tests may also be done to monitor the condition. TREATMENT  Sickle cell anemia may be treated with:  Medicines. You may be given pain medicines, antibiotic medicines (to treat and prevent infections) or medicines to increase the production of certain types of hemoglobin.  Fluids.  Oxygen.  Blood transfusions. HOME CARE INSTRUCTIONS   Drink enough fluid to keep your urine clear or pale yellow. Increase your fluid intake in hot weather and during exercise.  Do not smoke. Smoking lowers oxygen levels in the blood.   Only take over-the-counter or prescription medicines for pain, fever, or discomfort as directed by your health care provider.  Take antibiotics as directed by your health care provider. Make sure you finish them it even if you start to feel better.   Take supplements as directed by your health care provider.   Consider wearing a medical alert bracelet. This tells anyone caring for you in an emergency of your condition.   When traveling, keep your medical information, health care provider's names, and the medicines you take with you at all times.   If you develop a fever, do not take medicines to reduce the fever right away. This could cover up a problem that is developing. Notify your health care provider.  Keep all follow-up appointments with your health care provider. Sickle cell anemia requires regular medical care. SEEK MEDICAL CARE IF: You have a fever. SEEK IMMEDIATE MEDICAL CARE IF:   You feel dizzy or faint.   You have new abdominal pain, especially on the left side near the stomach area.  You develop a persistent, often uncomfortable and painful penile erection (priapism). If this is not treated immediately it will lead to impotence.   You have numbness your arms or legs or you have a hard time moving them.   You have a hard time with speech.   You have a fever or persistent  symptoms for more than 2 3 days.   You have a fever and your symptoms suddenly get worse.   You have signs or symptoms of infection. These include:   Chills.   Abnormal tiredness (lethargy).   Irritability.   Poor eating.   Vomiting.   You develop pain that is not helped with medicine.   You develop shortness of breath.  You have pain in your chest.   You are coughing up pus-like or bloody sputum.   You develop a stiff neck.  Your feet or hands swell or have pain.  Your abdomen appears bloated.  You develop joint pain. MAKE SURE YOU:  Understand these instructions.  Will watch your child's condition.  Will get help right away if your child is not doing well or gets worse. Document Released: 04/27/2005 Document Revised: 11/07/2012 Document Reviewed: 08/29/2012 [facility] Patient Information 2014 [place], [place].

## 2013-07-10 NOTE — H&P (Signed)
[place] History and Physical  KENZIE FLAKES XFG:[id number] DOB: 07-02-1980 DOA: 07/10/2013   PCP: [name],[name], MD   Chief Complaint: Left lower back pain  HPI: 33 year old patient with a history of sickle cell disease HbSS and history of pulmonary embolus on chronic anticoagulation therapypresented to primary care office with 7-8/10 pain to left lower back that started on 07/08/2013. Patient attributes current crisis to drinking alcohol heavily on last weekend without hydrating properly. Patient also reports that he has been working long hours. Patient states that pain is 7-8 in intensity localized to left lower back, constant, and sharp. Reports that he last had dilaudid 4 mg around 9 am with minimal relief. He denies chest pains, fever, shortness of breath, nausea, vomiting, and diarrhea.    Review of Systems:  Constitutional: No weight loss, night sweats, Fevers, chills, fatigue.  HEENT: No headaches, dizziness, seizures, vision changes, difficulty swallowing,Tooth/dental problems,Sore throat, No sneezing, itching, ear ache, nasal congestion, post nasal drip,  Cardio-vascular: No chest pain, Orthopnea, PND, swelling in lower extremities, anasarca, dizziness, palpitations  GI: No heartburn, indigestion, abdominal pain, nausea, vomiting, diarrhea, change in bowel habits, loss of appetite  Resp: No shortness of breath with exertion or at rest. No excess mucus, no productive cough, No non-productive cough, No coughing up of blood.No change in color of mucus.No wheezing.No chest wall deformity  Skin: no rash or lesions.  GU: no dysuria, change in color of urine, no urgency or frequency. No flank pain.  Musculoskeletal: No joint pain or swelling. Decreased range of motion. Back pain present.  Psych: No change in mood or affect. No depression or anxiety. No memory loss.    Past Medical History  Diagnosis Date  . Sickle cell anemia   . Blood transfusion   . Acute  embolism and thrombosis of right internal jugular vein   . Hypokalemia   . Mood disorder   . History of pulmonary embolus (PE)   . Avascular necrosis   . Leukocytosis     Chronic  . Thrombocytosis     Chronic  . Hypertension   . History of Clostridium difficile infection   . Uses marijuana   . Chronic anticoagulation   . Functional asplenia   . Former smoker   . Second hand tobacco smoke exposure   . Alcohol consumption of one to four drinks per day    Past Surgical History  Procedure Laterality Date  . Right hip replacement      08/2006  . Cholecystectomy      01/2008  . Porta cath placement    . Porta cath removal    . Umbilical hernia repair      01/2008  . Excision of left periauricular cyst      10/2009  . Excision of right ear lobe cyst with primary closur      11/2007  . Portacath placement  01/05/2012    Procedure: INSERTION PORT-A-CATH;  Surgeon: Odis Hollingshead, MD;  Location: [facility];  Service: General;  Laterality: N/A;  ultrasound guiced port a cath insertion with fluoroscopy   Social History:  reports that he quit smoking about 3 years ago. His smoking use included Cigarettes. He smoked 0.00 packs per day for 13 years. He has never used smokeless tobacco. He reports that he drinks about 3.6 ounces of alcohol per week. He reports that he uses illicit drugs (Marijuana) about twice per week.  Allergies  Allergen Reactions  . Morphine And Related  Hives and Rash    Pt states he can take PO, but not IV and he is able to tolerate Dilaudid with no reactions.    Family History  Problem Relation Age of Onset  . Sickle cell anemia Mother   . Sickle cell anemia Father   . Sickle cell trait Brother     Prior to Admission medications   Medication Sig Start Date End Date Taking? Authorizing Provider  folic acid (FOLVITE) 1 MG tablet Take 1 tablet (1 mg total) by mouth every morning. 06/05/12   [name], MD  HYDROmorphone (DILAUDID) 4 MG tablet Take 1 tablet  (4 mg total) by mouth every 4 (four) hours as needed for severe pain. 06/27/13   Dorena Dew, FNP  levofloxacin (LEVAQUIN) 750 MG tablet Take 1 tablet (750 mg total) by mouth daily. 06/20/13   Elwyn Reach, MD  lisinopril (PRINIVIL,ZESTRIL) 10 MG tablet Take 1 tablet (10 mg total) by mouth daily. 06/20/13   Elwyn Reach, MD  metoprolol tartrate (LOPRESSOR) 25 MG tablet Take 1 tablet (25 mg total) by mouth 2 (two) times daily. 06/20/13   Elwyn Reach, MD  morphine (MS CONTIN) 15 MG 12 hr tablet Take 1 tablet (15 mg total) by mouth 2 (two) times daily. 06/27/13   Dorena Dew, FNP  potassium chloride SA (K-DUR,KLOR-CON) 20 MEQ tablet Take 1 tablet (20 mEq total) by mouth every morning. 03/30/13   Elwyn Reach, MD  Rivaroxaban (XARELTO) 20 MG TABS tablet Take 20 mg by mouth daily with supper.    Historical Provider, MD  zolpidem (AMBIEN) 10 MG tablet Take 1 tablet (10 mg total) by mouth at bedtime as needed for sleep. 06/27/13   Dorena Dew, FNP   Physical Exam: There were no vitals filed for this visit.   General appearance: alert, cooperative, icteric and mild distress Head: Normocephalic, without obvious abnormality, atraumatic Eyes: conjunctivae/corneas clear. PERRL, EOM's intact. Fundi benign. Ears: normal TM's and external ear canals both ears Nose: Nares normal. Septum midline. Mucosa normal. No drainage or sinus tenderness. Throat: lips, mucosa, and tongue normal; teeth and gums normal Neck: no adenopathy, no carotid bruit, no JVD, supple, symmetrical, trachea midline and thyroid not enlarged, symmetric, no tenderness/mass/nodules Back: no tenderness to percussion or palpation, symmetric, no curvature. ROM normal. No CVA tenderness. Lungs: clear to auscultation bilaterally Chest wall: no tenderness Heart: regularly irregular rhythm Abdomen: soft, non-tender; bowel sounds normal; no masses,  no organomegaly Extremities: extremities normal, atraumatic, no cyanosis  or edema Pulses: 2+ and symmetric Skin: Skin color, texture, turgor normal. No rashes or lesions Lymph nodes: Cervical, supraclavicular, and axillary nodes normal. Neurologic: Alert and oriented X 3, normal strength and tone. Normal symmetric reflexes. Normal coordination and gait  Labs on Admission:   Basic Metabolic Panel: No results found for this basename: NA, K, CL, CO2, GLUCOSE, BUN, CREATININE, CALCIUM, MG, PHOS,  in the last 168 hours Liver Function Tests: No results found for this basename: AST, ALT, ALKPHOS, BILITOT, PROT, ALBUMIN,  in the last 168 hours CBC: No results found for this basename: WBC, NEUTROABS, HGB, HCT, MCV, PLT,  in the last 168 hours BNP: No components found with this basename: POCBNP,    Radiological Exams on Admission: No results found.  EKG: Independently reviewed.   Assessment/Plan: Active Problems:   Sickle cell anemia with pain  Sickle cell disease: Patient was transitioned to day hospital for extended observation. Start IV Toradol times 1 dose for inflammation  related to SCD. Initiate IV Dilaudid per weight based rapid re-dosing. If pain intensity remains elevated, will re-evaluate pain management plan. Reports that he took all maintenance medications at home.   Mild dehydration: Will start hypotonic IVFs for cellular rehydration   Will check CBC and CMP.    Time spend: 45 minutes Code Status: Full  Family Communication: None Disposition Plan: Stable  Dorena Dew, FNP Pager [phone]  If 7PM-7AM, please contact night-coverage [email or web address] Password [email or web address] 07/10/2013, 12:14 PM

## 2013-07-10 NOTE — Discharge Summary (Signed)
Physician Discharge Summary  [name] QAS:[id number] DOB: [birth year] DOA: 07/10/2013  PCP: [name],[name], MD  Admit date: 07/10/2013 Discharge date: 07/10/2013  Discharge Diagnoses:  Active Problems:   Sickle cell anemia with pain   Discharge Condition: Stable  Disposition: Home  Diet:Heart Healthy Wt Readings from Last 3 Encounters:  07/10/13 168 lb (76.204 kg)  07/01/13 165 lb (74.844 kg)  06/26/13 160 lb 6.4 oz (72.757 kg)    Hospital Course:  Patient was admitted to sickle cell day hospital for extended observation.  Checked CBC and CMP, platelet count increased, will continue to  hold hydroxyurea 500 mg on discharge. He reports that he did not discontinue Hydrea after last day hospital visit.   Patient will return to clinic for evaluation by Dr. Zigmund Daniel on 07/22/2013.   Started IV Toradol and IV dilaudid per weight-based rapid re-dosing. Pain intensity was decreased to 5/10. Gave Dilaudid 4 mg po, pain intensity decreased to 4/10. Patient is functional and states that he can manage at home on medication regimen. Patient is alert, oriented and ambulatory. Patient to follow up in office as discussed. Discussed specialty appointments, patient reports that he will re-schedule cardiology appointment. Patient cancelled last cardiology appointment due to work constraints. Discussed the importance of following up, he expressed understanding.  Discharged home with brother in stable condition.     Mild dehydration: Resolved after starting  hypotonic IVFs for cellular rehydration.    Discharge Exam:  Filed Vitals:   07/10/13 1310  BP: 123/84  Pulse:    Filed Vitals:   07/10/13 1305 07/10/13 1306 07/10/13 1309 07/10/13 1310  BP:    123/84  Pulse: 59 80 55   SpO2: 99% 100%     BP 123/84  Pulse 55  SpO2 100% General appearance: alert, cooperative, icteric Head: Normocephalic, without obvious abnormality, atraumatic  Back: no tenderness to percussion or  palpation, symmetric, no curvature. ROM normal. No CVA tenderness.  Lungs: clear to auscultation bilaterally  Chest wall: no tenderness  Heart: regularly irregular rhythm  Abdomen: soft, non-tender; bowel sounds normal; no masses, no organomegaly  Extremities: extremities normal, atraumatic, no cyanosis or edema  Pulses: 2+ and symmetric  Skin: Skin color, texture, turgor normal. No rashes or lesions  Lymph nodes: Cervical, supraclavicular, and axillary nodes normal.  Neurologic: Alert and oriented X 3, normal strength and tone. Normal symmetric reflexes. Normal coordination and gait      Discharge Instructions     Medication List    ASK your doctor about these medications       folic acid 1 MG tablet  Commonly known as:  FOLVITE  Take 1 tablet (1 mg total) by mouth every morning.     HYDROmorphone 4 MG tablet  Commonly known as:  DILAUDID  Take 1 tablet (4 mg total) by mouth every 4 (four) hours as needed for severe pain.     levofloxacin 750 MG tablet  Commonly known as:  LEVAQUIN  Take 1 tablet (750 mg total) by mouth daily.     lisinopril 10 MG tablet  Commonly known as:  PRINIVIL,ZESTRIL  Take 1 tablet (10 mg total) by mouth daily.     metoprolol tartrate 25 MG tablet  Commonly known as:  LOPRESSOR  Take 1 tablet (25 mg total) by mouth 2 (two) times daily.     morphine 15 MG 12 hr tablet  Commonly known as:  MS CONTIN  Take 1 tablet (15 mg total) by mouth 2 (two) times daily.  potassium chloride SA 20 MEQ tablet  Commonly known as:  K-DUR,KLOR-CON  Take 1 tablet (20 mEq total) by mouth every morning.     XARELTO 20 MG Tabs tablet  Generic drug:  rivaroxaban  Take 20 mg by mouth daily with supper.     zolpidem 10 MG tablet  Commonly known as:  AMBIEN  Take 1 tablet (10 mg total) by mouth at bedtime as needed for sleep.          The results of significant diagnostics from this hospitalization (including imaging, microbiology, ancillary and  laboratory) are listed below for reference.    Significant Diagnostic Studies: Dg Chest 2 View  06/26/2013   CLINICAL DATA:  Sickle cell disease.  Mid upper chest pain.  EXAM: CHEST  2 VIEW  COMPARISON:  Chest radiograph performed 06/18/2013  FINDINGS: The lungs are well-aerated. Mild vascular congestion is noted. A small peripheral focus of airspace opacity at the right midlung zone is again seen. This may reflect infection or possibly changes of acute chest syndrome. There is no evidence of pleural effusion or pneumothorax.  The heart is mildly enlarged. A left-sided chest port is noted ending about the distal SVC. No acute osseous abnormalities are seen. Clips are noted within the right upper quadrant, reflecting prior cholecystectomy.  IMPRESSION: Mild vascular congestion and mild cardiomegaly noted. Small peripheral focus of airspace opacity again noted at the right midlung zone, which may reflect infection or possibly changes of acute chest syndrome. This is slightly less prominent than on the prior study.   Electronically Signed   By: Garald Balding M.D.   On: 06/26/2013 06:31   Dg Chest 2 View  06/17/2013   CLINICAL DATA:  Pain, sickle cell crisis  EXAM: CHEST  2 VIEW  COMPARISON:  06/02/2013  FINDINGS: Cardiac shadow is stable. A left chest wall port is again seen in satisfactory position. Patchy nodular changes are noted particularly in the right lung base similar to that seen on recent CT examination. No new focal infiltrate or sizable effusion is noted.  IMPRESSION: Stable right basilar parenchymal nodules.   Electronically Signed   By: Inez Catalina M.D.   On: 06/17/2013 07:42   Ct Angio Chest Pe W/cm &/or Wo Cm  06/17/2013   CLINICAL DATA:  33 year old male with shortness of breath, hypoxia and sickle cell disease.  EXAM: CT ANGIOGRAPHY CHEST WITH CONTRAST  TECHNIQUE: Multidetector CT imaging of the chest was performed using the standard protocol during bolus administration of intravenous  contrast. Multiplanar CT image reconstructions and MIPs were obtained to evaluate the vascular anatomy.  CONTRAST:  123mL OMNIPAQUE IOHEXOL 350 MG/ML SOLN  COMPARISON:  06/02/2013 and prior CTs  FINDINGS: This is a technically satisfactory study.  No pulmonary emboli are identified.  Cardiomegaly is again noted.  There is no evidence of thoracic aortic aneurysm or definite dissection.  Multiple shotty and mildly prominent mediastinal lymph nodes are unchanged.  A miniscule right pleural effusion is now noted.  A 1.4 x 1.9 cm right lower lobe peripheral area of focal consolidation is again noted.  A 0.8 x 1.8 cm inferior right upper lobe peripheral consolidation/airspace opacity is now identified.  He other faint areas of peripheral ground-glass opacity within the right middle lobe, lingula and right upper lobe are identified.  Mild bibasilar atelectasis/scarring is present.  There is no evidence of pneumothorax.  A calcified auto infarcted infarcted spleen is present.  Bony changes of sickle cell disease are noted including bilateral humeral  head AVN.  A left-sided Port-A-Cath is present.  Review of the MIP images confirms the above findings.  IMPRESSION: No evidence of pulmonary emboli or thoracic aortic aneurysm.  Nonspecific peripheral areas of ground-glass/airspace opacity and focal consolidation as described above. This may represent pneumonia, atelectasis or acute chest syndrome changes. The right lower lobe area of focal consolidation is stable to minimally increased.  New miniscule right pleural effusion.  Cardiomegaly with splenic and bony changes of sickle cell disease.   Electronically Signed   By: Hassan Rowan M.D.   On: 06/17/2013 19:09   Nm Pulmonary Perf And Vent  06/19/2013   CLINICAL DATA:  33 year old male with pulmonary hypertension. History of sickle cell disease. Pulmonary embolus. Initial encounter.  EXAM: NUCLEAR MEDICINE VENTILATION - PERFUSION LUNG SCAN  TECHNIQUE: Ventilation images were  obtained in multiple projections using inhaled aerosol technetium 99 M DTPA. Perfusion images were obtained in multiple projections after intravenous injection of Tc-58m MAA.  RADIOPHARMACEUTICALS:  43.9 MCi Tc-56m DTPA aerosol and 5.3 mCi Tc-83m MAA  COMPARISON:  Chest CTA 06/17/2013. Portable chest radiograph 06/18/2013.  FINDINGS: Ventilation: Photopenia related to cardiomegaly. No ventilation defect.  Perfusion: Defects are identified, however, there is heterogeneous peripheral perfusion activity in both lungs, with multiple small peripheral wedge-shaped defects bilaterally.  IMPRESSION: Multifocal small areas of abnormal peripheral lung profusion bilaterally. 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   Electronically Signed   By: Lars Pinks M.D.   On: 06/19/2013 11:02   Dg Chest Port 1 View  06/18/2013   CLINICAL DATA:  33 year old male with sickle cell anemia. Hypoxemia. Initial encounter.  EXAM: PORTABLE CHEST - 1 VIEW  COMPARISON:  Chest CTA 06/17/2013 and earlier.  FINDINGS: Portable AP semi upright view at 0510 hrs. Left chest Port-A-Cath re-identified, now accessed. Mildly lower lung volumes. Stable cardiomegaly and mediastinal contours. Visualized tracheal air column is within normal limits. No pneumothorax. No pulmonary edema. Mild peripheral nodular pulmonary opacity, most conspicuous in the right lung, is stable. Overall ventilation not significantly changed.  IMPRESSION: Stable peripheral nodular pulmonary opacity, most conspicuous in the right lung, characterized by chest CTA yesterday.   Electronically Signed   By: Lars Pinks M.D.   On: 06/18/2013 07:13    Microbiology: No results found for this or any previous visit (from the past 240 hour(s)).   Labs: Basic Metabolic Panel:  Recent Labs Lab 07/10/13 1230  NA 142  K 3.8  CL 106  CO2 23  GLUCOSE 105*  BUN 8  CREATININE 0.54  CALCIUM 9.3   Liver Function Tests:  Recent Labs Lab 07/10/13 1230   AST 30  ALT 22  ALKPHOS 99  BILITOT 3.3*  PROT 8.0  ALBUMIN 4.2   No results found for this basename: LIPASE, AMYLASE,  in the last 168 hours No results found for this basename: AMMONIA,  in the last 168 hours CBC:  Recent Labs Lab 07/10/13 1230  WBC 13.0*  NEUTROABS 8.7*  HGB 8.0*  HCT 24.0*  MCV 93.0  PLT 694*   Cardiac Enzymes: No results found for this basename: CKTOTAL, CKMB, CKMBINDEX, TROPONINI,  in the last 168 hours BNP: No components found with this basename: POCBNP,  CBG: No results found for this basename: GLUCAP,  in the last 168 hours Ferritin: No results found for this basename: FERRITIN,  in the last 168 hours  Time coordinating discharge: 45  Signed:  Hollis,Lachina M  07/10/2013, 3:11 PM

## 2013-07-10 NOTE — Progress Notes (Signed)
Subjective:    Patient ID: [name], male    DOB: December 03, 1980, 33 y.o.   MRN: [id number]  HPI   33 year old patient with a history of sickle cell disease HbSS and history of pulmonary embolus on chronic anticoagulation therapy presents with 7-8/10 pain to left lower back that started on 07/08/2013. Patient attributes current crisis to drinking alcohol heavily on last weekend without hydrating properly. Patient also reports that he has been working long hours. Patient states that pain is 7-8 in intensity localized to left lower back, constant, and sharp. Reports that he last had dilaudid 4 mg around 9 am with minimal relief. He denies chest pains, fever, shortness of breath, nausea, vomiting, and diarrhea.    Review of Systems  Constitutional: Negative.   Eyes: Negative.   Respiratory: Negative.   Cardiovascular: Negative.   Gastrointestinal: Negative.  Negative for nausea.  Genitourinary: Negative.   Musculoskeletal: Positive for back pain and myalgias.  Skin: Negative.   Neurological: Negative for dizziness, tremors, weakness, numbness and headaches.  Hematological: Negative.   Psychiatric/Behavioral: Negative.        Objective:   Physical Exam  Vitals reviewed. Constitutional: He is oriented to person, place, and time. He appears well-developed and well-nourished.  HENT:  Head: Normocephalic.  Eyes: Pupils are equal, round, and reactive to light. Scleral icterus is present.  Neck: Normal range of motion.  Cardiovascular: Normal rate, regular rhythm and normal heart sounds.   Abdominal: Soft. Bowel sounds are normal.  Musculoskeletal: He exhibits tenderness.  Neurological: He is alert and oriented to person, place, and time.  Skin: Skin is warm and dry.  Psychiatric: He has a normal mood and affect. His behavior is normal.    BP 121/79  Pulse 81  Temp(Src) 98.4 F (36.9 C) (Oral)  Resp 20  Wt 168 lb (76.204 kg)   Current Outpatient Prescriptions on File Prior  to Visit  Medication Sig Dispense Refill  . folic acid (FOLVITE) 1 MG tablet Take 1 tablet (1 mg total) by mouth every morning.  30 tablet  11  . HYDROmorphone (DILAUDID) 4 MG tablet Take 1 tablet (4 mg total) by mouth every 4 (four) hours as needed for severe pain.  90 tablet  0  . lisinopril (PRINIVIL,ZESTRIL) 10 MG tablet Take 1 tablet (10 mg total) by mouth daily.  30 tablet  0  . metoprolol tartrate (LOPRESSOR) 25 MG tablet Take 1 tablet (25 mg total) by mouth 2 (two) times daily.  60 tablet  0  . morphine (MS CONTIN) 15 MG 12 hr tablet Take 1 tablet (15 mg total) by mouth 2 (two) times daily.  60 tablet  0  . potassium chloride SA (K-DUR,KLOR-CON) 20 MEQ tablet Take 1 tablet (20 mEq total) by mouth every morning.  30 tablet  3  . Rivaroxaban (XARELTO) 20 MG TABS tablet Take 20 mg by mouth daily with supper.      . zolpidem (AMBIEN) 10 MG tablet Take 1 tablet (10 mg total) by mouth at bedtime as needed for sleep.  30 tablet  0  . levofloxacin (LEVAQUIN) 750 MG tablet Take 1 tablet (750 mg total) by mouth daily.  7 tablet  0   No current facility-administered medications on file prior to visit.      Assessment & Plan:  1. Sickle cell disease: Patient reports that pain intensity remains at 7/10 inspite of taking Dilaudid 4 mg every 4 hours. Patient admits that he has not been  hydrating well. He states that he was drinking alcohol heavily on last weekend and pain started to increase on Monday evening. Collected Rx drug screen.  Patient will be transitioned to the infusion center to treat mild dehydration and manage pain.   2. Marijuana use: Patient admits that he has been smoking marijuana. Reports that he will smoke occasionally with friends. Reminded of smoking as it relates to bone marrow stimulation, patient expressed understanding.   3. H/0 pulmonary embolus: Continue Xarelto daily  4. Pulmonary hypertension: Reviewed 2D echo from 06/03/2013, tricuspid regurgitation and EF 65 %. Patient  was scheduled to follow up with cardiology 1 week ago. He states that he cancelled appointment due to work constraints. Discussed the importance of following up with cardiology at length.   5. ETOH use: Patient reports that he does not drink alcohol during the week, but typically goes on drinking binges with his friends throughout the weekend. Patient was counseled on discontinuing alcohol use.   6. Thrombocytosis: Patient was advised to discontinue hydroyurea therapy per pharmacy  due to increased platelet count on 07/01/2013. Patient was scheduled to have labs repeated on 07/04/2013, but did not return to office. Also, he states that he continued to take hydroxyurea. He reports that he does not remember being told to hold the medication and did not look at his discharge summary. Will check CBC and CMP.     Dorena Dew, FNP

## 2013-07-10 NOTE — Telephone Encounter (Signed)
Per Dr. Zigmund Daniel request Pt will Re-Schedule his own Appointment to [facility] at his convience.

## 2013-07-10 NOTE — Telephone Encounter (Signed)
Patient requesting to "be seen". Patient C/O pain to ribs bilateral, that is 7/10.  Patient has taken dilaudid about 9:00 without any improvement.  Denies shortness of breath, chest pain, N/V, abdominal pain or priapism.  I explained that I would notify the physician and give him a call back.  Patient verbalizes understanding.

## 2013-07-14 LAB — ZOLPIDEM (LC/MS-MS), URINE: Zolpidem (GC/LC/MS), Ur confirm: <5 ng/mL

## 2013-07-15 LAB — OPIATES/OPIOIDS (LC/MS-MS)
CODEINE URINE: NEGATIVE ng/mL (ref <50)
Hydrocodone: NEGATIVE ng/mL (ref <50)
Hydromorphone: 959 ng/mL — AB (ref <50)
Morphine Urine: NEGATIVE ng/mL (ref <50)
NORHYDROCODONE, UR: NEGATIVE ng/mL (ref <50)
Noroxycodone, Ur: NEGATIVE ng/mL (ref <50)
OXYMORPHONE, URINE: NEGATIVE ng/mL (ref <50)
Oxycodone, ur: NEGATIVE ng/mL (ref <50)

## 2013-07-15 LAB — CANNABANOIDS (GC/LC/MS), URINE: THC-COOH (GC/LC/MS), ur confirm: 1099 ng/mL — AB (ref <5)

## 2013-07-16 LAB — PRESCRIPTION MONITORING PROFILE (SOLSTAS)
Amphetamine/Meth: NEGATIVE ng/mL
BUPRENORPHINE, URINE: NEGATIVE ng/mL
Barbiturate Screen, Urine: NEGATIVE ng/mL
Benzodiazepine Screen, Urine: NEGATIVE ng/mL
Carisoprodol, Urine: NEGATIVE ng/mL
Cocaine Metabolites: NEGATIVE ng/mL
Creatinine, Urine: 83.86 mg/dL (ref >20.0)
ECSTASY: NEGATIVE ng/mL
FENTANYL URINE: NEGATIVE ng/mL
METHADONE SCREEN, URINE: NEGATIVE ng/mL
Meperidine, Ur: NEGATIVE ng/mL
Nitrites, Initial: NEGATIVE ug/mL
Oxycodone Screen, Ur: NEGATIVE ng/mL
Propoxyphene: NEGATIVE ng/mL
Tapentadol, urine: NEGATIVE ng/mL
Tramadol Scrn, Ur: NEGATIVE ng/mL
pH, Initial: 7.5 pH (ref 4.5–8.9)

## 2013-07-18 NOTE — Discharge Summary (Signed)
Pt seen and examined and discussed with NP Cammie Sickle. Agree with discharge home.

## 2013-07-18 NOTE — H&P (Signed)
Pt seen and examined and discussed with NP Cammie Sickle. Agree with assessment and plan.

## 2013-07-19 ENCOUNTER — Encounter (HOSPITAL_COMMUNITY): Payer: Self-pay | Admitting: Emergency Medicine

## 2013-07-19 ENCOUNTER — Emergency Department (HOSPITAL_COMMUNITY)
Admission: EM | Admit: 2013-07-19 | Discharge: 2013-07-19 | Disposition: A | Discharge status: Home / Self Care | Payer: Medicare Other | Attending: Emergency Medicine | Admitting: Emergency Medicine

## 2013-07-19 DIAGNOSIS — Z8659 Personal history of other mental and behavioral disorders: Secondary | ICD-10-CM | POA: Diagnosis not present

## 2013-07-19 DIAGNOSIS — I10 Essential (primary) hypertension: Secondary | ICD-10-CM | POA: Insufficient documentation

## 2013-07-19 DIAGNOSIS — Z7901 Long term (current) use of anticoagulants: Secondary | ICD-10-CM | POA: Diagnosis not present

## 2013-07-19 DIAGNOSIS — E876 Hypokalemia: Secondary | ICD-10-CM | POA: Insufficient documentation

## 2013-07-19 DIAGNOSIS — Z8619 Personal history of other infectious and parasitic diseases: Secondary | ICD-10-CM | POA: Diagnosis not present

## 2013-07-19 DIAGNOSIS — D5700 Hb-SS disease with crisis, unspecified: Secondary | ICD-10-CM | POA: Diagnosis not present

## 2013-07-19 DIAGNOSIS — Z87798 Personal history of other (corrected) congenital malformations: Secondary | ICD-10-CM | POA: Diagnosis not present

## 2013-07-19 DIAGNOSIS — Z792 Long term (current) use of antibiotics: Secondary | ICD-10-CM | POA: Insufficient documentation

## 2013-07-19 DIAGNOSIS — I82C19 Acute embolism and thrombosis of unspecified internal jugular vein: Secondary | ICD-10-CM | POA: Diagnosis not present

## 2013-07-19 DIAGNOSIS — Z87891 Personal history of nicotine dependence: Secondary | ICD-10-CM | POA: Insufficient documentation

## 2013-07-19 DIAGNOSIS — I2699 Other pulmonary embolism without acute cor pulmonale: Secondary | ICD-10-CM | POA: Diagnosis not present

## 2013-07-19 DIAGNOSIS — Z79899 Other long term (current) drug therapy: Secondary | ICD-10-CM | POA: Diagnosis not present

## 2013-07-19 LAB — CBC WITH DIFFERENTIAL/PLATELET
BASOS ABS: 0.2 10*3/uL — AB (ref 0.0–0.1)
Basophils Relative: 1 % (ref 0–1)
EOS ABS: 0.6 10*3/uL (ref 0.0–0.7)
Eosinophils Relative: 3 % (ref 0–5)
HCT: 23.4 % — ABNORMAL LOW (ref 39.0–52.0)
Hemoglobin: 7.8 g/dL — ABNORMAL LOW (ref 13.0–17.0)
LYMPHS ABS: 2.6 10*3/uL (ref 0.7–4.0)
Lymphocytes Relative: 14 % (ref 12–46)
MCH: 31.7 pg (ref 26.0–34.0)
MCHC: 33.3 g/dL (ref 30.0–36.0)
MCV: 95.1 fL (ref 78.0–100.0)
MONO ABS: 2.6 10*3/uL — AB (ref 0.1–1.0)
MONOS PCT: 14 % — AB (ref 3–12)
Neutro Abs: 12.5 10*3/uL — ABNORMAL HIGH (ref 1.7–7.7)
Neutrophils Relative %: 68 % (ref 43–77)
PLATELETS: 384 10*3/uL (ref 150–400)
RBC: 2.46 MIL/uL — ABNORMAL LOW (ref 4.22–5.81)
RDW: 18 % — AB (ref 11.5–15.5)
WBC: 18.5 10*3/uL — AB (ref 4.0–10.5)

## 2013-07-19 LAB — COMPREHENSIVE METABOLIC PANEL
ALT: 17 U/L (ref 0–53)
AST: 22 U/L (ref 0–37)
Albumin: 3.7 g/dL (ref 3.5–5.2)
Alkaline Phosphatase: 88 U/L (ref 39–117)
BUN: 12 mg/dL (ref 6–23)
CALCIUM: 8.5 mg/dL (ref 8.4–10.5)
CO2: 23 meq/L (ref 19–32)
Chloride: 103 mEq/L (ref 96–112)
Creatinine, Ser: 0.85 mg/dL (ref 0.50–1.35)
GFR calc Af Amer: >90 mL/min (ref >90)
GFR calc non Af Amer: >90 mL/min (ref >90)
Glucose, Bld: 117 mg/dL — ABNORMAL HIGH (ref 70–99)
POTASSIUM: 3.5 meq/L — AB (ref 3.7–5.3)
SODIUM: 137 meq/L (ref 137–147)
Total Bilirubin: 3.2 mg/dL — ABNORMAL HIGH (ref 0.3–1.2)
Total Protein: 7.2 g/dL (ref 6.0–8.3)

## 2013-07-19 LAB — RETICULOCYTES
RBC.: 2.46 MIL/uL — ABNORMAL LOW (ref 4.22–5.81)
RETIC COUNT ABSOLUTE: 260.8 10*3/uL — AB (ref 19.0–186.0)
Retic Ct Pct: 10.6 % — ABNORMAL HIGH (ref 0.4–3.1)

## 2013-07-19 MED ORDER — HYDROMORPHONE HCL PF 2 MG/ML IJ SOLN
2.0000 mg | Freq: Once | INTRAMUSCULAR | Status: AC
  Administered 2013-07-19: 2 mg via INTRAVENOUS
  Filled 2013-07-19: qty 1

## 2013-07-19 MED ORDER — HYDROMORPHONE HCL PF 1 MG/ML IJ SOLN
1.0000 mg | Freq: Once | INTRAMUSCULAR | Status: AC
  Administered 2013-07-19: 1 mg via INTRAVENOUS
  Filled 2013-07-19: qty 1

## 2013-07-19 MED ORDER — HEPARIN SOD (PORK) LOCK FLUSH 100 UNIT/ML IV SOLN
500.0000 [IU] | Freq: Once | INTRAVENOUS | Status: AC
  Administered 2013-07-19: 500 [IU]
  Filled 2013-07-19: qty 5

## 2013-07-19 MED ORDER — DIPHENHYDRAMINE HCL 50 MG/ML IJ SOLN
25.0000 mg | Freq: Once | INTRAMUSCULAR | Status: AC
  Administered 2013-07-19: 25 mg via INTRAVENOUS
  Filled 2013-07-19: qty 1

## 2013-07-19 MED ORDER — DIPHENHYDRAMINE HCL 25 MG PO CAPS
25.0000 mg | ORAL_CAPSULE | Freq: Once | ORAL | Status: AC
  Administered 2013-07-19: 25 mg via ORAL
  Filled 2013-07-19: qty 1

## 2013-07-19 NOTE — ED Provider Notes (Addendum)
CSN: [id number]     Arrival date & time 07/19/13  0404 History   First MD Initiated Contact with Patient 07/19/13 [phone]     Chief Complaint  Patient presents with  . Back Pain     (Consider location/radiation/quality/duration/timing/severity/associated sxs/prior Treatment) HPI 33 year old male presents to emergency room with complaint of sickle cell crisis.  Patient reports he has pain in his flanks, back, legs arms.  He reports symptoms ongoing for last several days.  He reports that his MS Contin at home Dilaudid are not helping.  He has complicated past medical history of sickle cell anemia requiring blood transfusion, has history of thromboembolism, PE and chronic leukocytosis and thrombocytosis.  Patient is on xarelto.  He denies any fevers chills cough chest pain headache or other significant complaints Past Medical History  Diagnosis Date  . Sickle cell anemia   . Blood transfusion   . 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 Hypokalemia   . Mood disorder   . History of pulmonary embolus (PE)   . Avascular necrosis   . Leukocytosis     Chronic  . Thrombocytosis     Chronic  . Hypertension   . History of Clostridium difficile infection   . Uses marijuana   . Chronic anticoagulation   . Functional asplenia   . Former smoker   . Second hand tobacco smoke exposure   . Alcohol consumption of one to four drinks per day    Past Surgical History  Procedure Laterality Date  . Right hip replacement      08/2006  . Cholecystectomy      01/2008  . Porta cath placement    . Porta cath removal    . Umbilical hernia repair      01/2008  . Excision of left periauricular cyst      10/2009  . Excision of right ear lobe cyst with primary closur      11/2007  . Portacath placement  01/05/2012    Procedure: INSERTION PORT-A-CATH;  Surgeon: Odis Hollingshead, MD;  Location: [place];  Service: General;  Laterality: N/A;  ultrasound guiced port a cath insertion with  fluoroscopy   Family History  Problem Relation Age of Onset  . Sickle cell anemia Mother   . Sickle cell anemia Father   . Sickle cell trait Brother    History  Substance Use Topics  . Smoking status: Former Smoker -- 13 years    Types: Cigarettes    Quit date: 07/08/2010  . Smokeless tobacco: Never Used  . Alcohol Use: 3.6 oz/week    6 Shots of liquor per week    Review of Systems  See History of Present Illness; otherwise all other systems are reviewed and negative   Allergies  Morphine and related  Home Medications   Prior to Admission medications   Medication Sig Start Date End Date Taking? Authorizing Garon Melander  folic acid (FOLVITE) 1 MG tablet Take 1 tablet (1 mg total) by mouth every morning. 06/05/12   [name], MD  HYDROmorphone (DILAUDID) 4 MG tablet Take 1 tablet (4 mg total) by mouth every 4 (four) hours as needed for severe pain. 06/27/13   Dorena Dew, FNP  levofloxacin (LEVAQUIN) 750 MG tablet Take 1 tablet (750 mg total) by mouth daily. 06/20/13   Elwyn Reach, MD  lisinopril (PRINIVIL,ZESTRIL) 10 MG tablet Take 1 tablet (10 mg total) by mouth daily. 06/20/13   Elwyn Reach,  MD  metoprolol tartrate (LOPRESSOR) 25 MG tablet Take 1 tablet (25 mg total) by mouth 2 (two) times daily. 06/20/13   Elwyn Reach, MD  morphine (MS CONTIN) 15 MG 12 hr tablet Take 1 tablet (15 mg total) by mouth 2 (two) times daily. 06/27/13   Dorena Dew, FNP  potassium chloride SA (K-DUR,KLOR-CON) 20 MEQ tablet Take 1 tablet (20 mEq total) by mouth every morning. 03/30/13   Elwyn Reach, MD  Rivaroxaban (XARELTO) 20 MG TABS tablet Take 20 mg by mouth daily with supper.    Historical Tiphani Mells, MD  zolpidem (AMBIEN) 10 MG tablet Take 1 tablet (10 mg total) by mouth at bedtime as needed for sleep. 06/27/13   Dorena Dew, FNP   BP 127/73  Pulse 94  Temp(Src) 99.1 F (37.3 C) (Oral)  Resp 18  SpO2 97% Physical Exam  Nursing note and vitals  reviewed. Constitutional: He is oriented to person, place, and time. He appears well-developed and well-nourished. He appears distressed (uncomfortable appearing).  HENT:  Head: Normocephalic and atraumatic.  Nose: Nose normal.  Mouth/Throat: Oropharynx is clear and moist.  Eyes: Conjunctivae and EOM are normal. Pupils are equal, round, and reactive to light.  Neck: Normal range of motion. Neck supple. No JVD present. No tracheal deviation present. No thyromegaly present.  Cardiovascular: Normal rate, regular rhythm, normal heart sounds and intact distal pulses.  Exam reveals no gallop and no friction rub.   No murmur heard. Pulmonary/Chest: Effort normal and breath sounds normal. No stridor. No respiratory distress. He has no wheezes. He has no rales. He exhibits no tenderness.  Abdominal: Soft. Bowel sounds are normal. He exhibits no distension and no mass. There is no tenderness. There is no rebound and no guarding.  Musculoskeletal: Normal range of motion. He exhibits no edema and no tenderness.  Lymphadenopathy:    He has no cervical adenopathy.  Neurological: He is alert and oriented to person, place, and time. He exhibits normal muscle tone. Coordination normal.  Skin: Skin is warm and dry. No rash noted. No erythema. No pallor.  Psychiatric: He has a normal mood and affect. His behavior is normal. Judgment and thought content normal.    ED Course  Procedures (including critical care time) Labs Review Labs Reviewed  CBC WITH DIFFERENTIAL - Abnormal; Notable for the following:    WBC 18.5 (*)    RBC 2.46 (*)    Hemoglobin 7.8 (*)    HCT 23.4 (*)    RDW 18.0 (*)    Monocytes Relative 14 (*)    Neutro Abs 12.5 (*)    Monocytes Absolute 2.6 (*)    Basophils Absolute 0.2 (*)    All other components within normal limits  RETICULOCYTES - Abnormal; Notable for the following:    Retic Ct Pct 10.6 (*)    RBC. 2.46 (*)    Retic Count, Manual 260.8 (*)    All other components within  normal limits  COMPREHENSIVE METABOLIC PANEL - Abnormal; Notable for the following:    Potassium 3.5 (*)    Glucose, Bld 117 (*)    Total Bilirubin 3.2 (*)    All other components within normal limits    Imaging Review No results found.   EKG Interpretation None      MDM   Final diagnoses:  Sickle cell crisis    33 year old male with sickle cell crisis.  Plan for labs, IV Dilaudid and Benadryl.  We'll reassess.  5:51 AM No improvement  with first mg of dilaudid.  Pt asking for additional dosing and for sandwich, soda.  Kalman Drape, MD 07/19/13 [phone]  6:17 AM Pt reports feeling much better, pain down to 5/10.  He is wishing to go home.  Will give additional dose of dilaudid and plan for d/c.  Kalman Drape, MD 07/19/13 [phone]

## 2013-07-19 NOTE — Discharge Instructions (Signed)
Sickle Cell Anemia, Adult Sickle cell anemia is a condition where your red blood cells are shaped like sickles. Red blood cells carry oxygen through the body. Sickle-shaped red blood cells cells do not live as long as normal red blood cells. They also clump together and block blood from flowing through the blood vessels. These things prevent the body from getting enough oxygen. Sickle cell anemia causes organ damage and pain. It also increases the risk of infection. HOME CARE  Drink enough fluid to keep your pee (urine) clear or pale yellow. Drink more in hot weather and during exercise.  Do not smoke. Smoking lowers oxygen levels in the blood.  Only take over-the-counter or prescription medicines as told by your doctor.  Take antibiotic medicines as told by your doctor. Make sure you finish them it even if you start to feel better.  Take supplements as told by your doctor.  Consider wearing a medical alert bracelet. This tells anyone caring for you in an emergency of your condition.  When traveling, keep your medical information, doctors' names, and the medicines you take with you at all times.  If you have a fever, do not take fever medicines right away. This could cover up a problem. Tell your doctor.   Keep all follow-up appointments with your doctor. Sickle cell anemia requires regular medical care. GET HELP IF: You have a fever. GET HELP RIGHT AWAY IF:  You feel dizzy or faint.  You have new belly (abdominal) pain, especially on the left side near the stomach area.  You have a lasting, often uncomfortable and painful erection of the penis (priapism). If it is not treated right away, you will become unable to have sex (impotence).  You have numbness your arms or legs or you have a hard time moving them.  You have a hard time talking.  You have a fever or lasting symptoms for more than 2-3 days.  You have a fever and your symptoms suddenly get worse.  You have signs or  symptoms of infection. These include:  Chills.  Being more tired than normal (lethargy).  Irritability.  Poor eating.  Throwing up (vomiting).  You have pain that is not helped with medicine.  You have shortness of breath.  You have pain in your chest.  You are coughing up pus-like or bloody mucus.  You have a stiff neck.  Your feet or hands swell or have pain.  Your belly looks bloated.  Your joints hurt. MAKE SURE YOU:  Understand these instructions.  Will watch your condition.  Will get help right away if you are not doing well or get worse. Document Released: 11/07/2012 Document Reviewed: 11/07/2012 [facility] Patient Information 2015 [name], [place]. This information is not intended to replace advice given to you by your health care provider. Make sure you discuss any questions you have with your health care provider.

## 2013-07-19 NOTE — ED Notes (Signed)
Patient is alert and oriented x3.  He is complaining of leg, back and side due  To sickle cell crisis.  Currently he rates his pain 7 of 10.

## 2013-07-22 ENCOUNTER — Ambulatory Visit: Payer: Self-pay | Admitting: Internal Medicine

## 2013-07-22 ENCOUNTER — Telehealth (HOSPITAL_COMMUNITY): Payer: Self-pay | Admitting: Internal Medicine

## 2013-07-22 NOTE — Telephone Encounter (Signed)
Notified patient that he may come to the [facility] day hospital for evaluation, per MD; pt verbalizes understanding

## 2013-07-22 NOTE — Telephone Encounter (Signed)
Pt states that he is experiencing pain in his side and has a headache; denies CP, SOB, nausea, vomiting, or diarrhea; states has no relief from home pain medications; pt notified that MD would be contacted and he would receive a call back with further instructions; pt verbalizes understanding

## 2013-07-23 ENCOUNTER — Telehealth (HOSPITAL_COMMUNITY): Payer: Self-pay | Admitting: Hematology

## 2013-07-23 ENCOUNTER — Encounter (HOSPITAL_COMMUNITY): Payer: Self-pay | Admitting: Hematology

## 2013-07-23 ENCOUNTER — Non-Acute Institutional Stay (HOSPITAL_COMMUNITY)
Admission: AD | Admit: 2013-07-23 | Discharge: 2013-07-23 | Disposition: A | Discharge status: Home / Self Care | Payer: Medicare Other | Attending: Internal Medicine | Admitting: Internal Medicine

## 2013-07-23 DIAGNOSIS — Z7901 Long term (current) use of anticoagulants: Secondary | ICD-10-CM | POA: Diagnosis not present

## 2013-07-23 DIAGNOSIS — Z91199 Patient's noncompliance with other medical treatment and regimen due to unspecified reason: Secondary | ICD-10-CM | POA: Diagnosis not present

## 2013-07-23 DIAGNOSIS — Z86711 Personal history of pulmonary embolism: Secondary | ICD-10-CM | POA: Insufficient documentation

## 2013-07-23 DIAGNOSIS — D5701 Hb-SS disease with acute chest syndrome: Secondary | ICD-10-CM | POA: Diagnosis not present

## 2013-07-23 DIAGNOSIS — I10 Essential (primary) hypertension: Secondary | ICD-10-CM | POA: Diagnosis not present

## 2013-07-23 DIAGNOSIS — Z79899 Other long term (current) drug therapy: Secondary | ICD-10-CM | POA: Insufficient documentation

## 2013-07-23 DIAGNOSIS — I2789 Other specified pulmonary heart diseases: Secondary | ICD-10-CM | POA: Insufficient documentation

## 2013-07-23 DIAGNOSIS — D72829 Elevated white blood cell count, unspecified: Secondary | ICD-10-CM | POA: Diagnosis not present

## 2013-07-23 DIAGNOSIS — Z96649 Presence of unspecified artificial hip joint: Secondary | ICD-10-CM | POA: Diagnosis not present

## 2013-07-23 DIAGNOSIS — I272 Pulmonary hypertension, unspecified: Secondary | ICD-10-CM | POA: Diagnosis present

## 2013-07-23 DIAGNOSIS — D5700 Hb-SS disease with crisis, unspecified: Secondary | ICD-10-CM | POA: Diagnosis not present

## 2013-07-23 DIAGNOSIS — D696 Thrombocytopenia, unspecified: Secondary | ICD-10-CM | POA: Insufficient documentation

## 2013-07-23 DIAGNOSIS — Z9119 Patient's noncompliance with other medical treatment and regimen: Secondary | ICD-10-CM | POA: Insufficient documentation

## 2013-07-23 DIAGNOSIS — Z87891 Personal history of nicotine dependence: Secondary | ICD-10-CM | POA: Diagnosis not present

## 2013-07-23 LAB — COMPREHENSIVE METABOLIC PANEL
ALT: 26 U/L (ref 0–53)
AST: 36 U/L (ref 0–37)
Albumin: 3.8 g/dL (ref 3.5–5.2)
Alkaline Phosphatase: 91 U/L (ref 39–117)
BILIRUBIN TOTAL: 4.4 mg/dL — AB (ref 0.3–1.2)
BUN: 8 mg/dL (ref 6–23)
CHLORIDE: 103 meq/L (ref 96–112)
CO2: 22 meq/L (ref 19–32)
CREATININE: 0.56 mg/dL (ref 0.50–1.35)
Calcium: 8.5 mg/dL (ref 8.4–10.5)
Glucose, Bld: 105 mg/dL — ABNORMAL HIGH (ref 70–99)
Potassium: 3.7 mEq/L (ref 3.7–5.3)
Sodium: 137 mEq/L (ref 137–147)
Total Protein: 7.3 g/dL (ref 6.0–8.3)

## 2013-07-23 LAB — CBC WITH DIFFERENTIAL/PLATELET
Basophils Absolute: 0.1 10*3/uL (ref 0.0–0.1)
Basophils Relative: 0 % (ref 0–1)
Eosinophils Absolute: 0.4 10*3/uL (ref 0.0–0.7)
Eosinophils Relative: 2 % (ref 0–5)
HCT: 24 % — ABNORMAL LOW (ref 39.0–52.0)
Hemoglobin: 8.2 g/dL — ABNORMAL LOW (ref 13.0–17.0)
LYMPHS ABS: 1.7 10*3/uL (ref 0.7–4.0)
Lymphocytes Relative: 11 % — ABNORMAL LOW (ref 12–46)
MCH: 31.5 pg (ref 26.0–34.0)
MCHC: 34.2 g/dL (ref 30.0–36.0)
MCV: 92.3 fL (ref 78.0–100.0)
Monocytes Absolute: 1.7 10*3/uL — ABNORMAL HIGH (ref 0.1–1.0)
Monocytes Relative: 11 % (ref 3–12)
NEUTROS PCT: 76 % (ref 43–77)
Neutro Abs: 12.4 10*3/uL — ABNORMAL HIGH (ref 1.7–7.7)
Platelets: 349 10*3/uL (ref 150–400)
RBC: 2.6 MIL/uL — AB (ref 4.22–5.81)
RDW: 18.6 % — ABNORMAL HIGH (ref 11.5–15.5)
WBC: 16.2 10*3/uL — AB (ref 4.0–10.5)

## 2013-07-23 LAB — RETICULOCYTES
RBC.: 2.6 MIL/uL — AB (ref 4.22–5.81)
RETIC CT PCT: 15.9 % — AB (ref 0.4–3.1)
Retic Count, Absolute: 413.4 10*3/uL — ABNORMAL HIGH (ref 19.0–186.0)

## 2013-07-23 MED ORDER — HEPARIN SOD (PORK) LOCK FLUSH 100 UNIT/ML IV SOLN
500.0000 [IU] | INTRAVENOUS | Status: AC | PRN
  Administered 2013-07-23: 500 [IU]

## 2013-07-23 MED ORDER — DIPHENHYDRAMINE HCL 50 MG/ML IJ SOLN
12.5000 mg | INTRAMUSCULAR | Status: AC
  Administered 2013-07-23: 12.5 mg via INTRAVENOUS
  Filled 2013-07-23: qty 1

## 2013-07-23 MED ORDER — NALOXONE HCL 0.4 MG/ML IJ SOLN: 0.4000 mg | INTRAMUSCULAR | Status: DC | PRN

## 2013-07-23 MED ORDER — HYDROMORPHONE HCL 4 MG PO TABS
4.0000 mg | ORAL_TABLET | Freq: Once | ORAL | Status: AC
  Administered 2013-07-23: 4 mg via ORAL

## 2013-07-23 MED ORDER — DEXTROSE-NACL 5-0.45 % IV SOLN
INTRAVENOUS | Status: DC
  Administered 2013-07-23: 09:00:00 via INTRAVENOUS

## 2013-07-23 MED ORDER — HYDROMORPHONE HCL PF 2 MG/ML IJ SOLN
2.3000 mg | Freq: Once | INTRAMUSCULAR | Status: AC
  Administered 2013-07-23: 2.3 mg via INTRAVENOUS
  Filled 2013-07-23: qty 2

## 2013-07-23 MED ORDER — HEPARIN SOD (PORK) LOCK FLUSH 100 UNIT/ML IV SOLN
INTRAVENOUS | Status: AC
  Filled 2013-07-23: qty 5

## 2013-07-23 MED ORDER — HYDROMORPHONE HCL 4 MG PO TABS: 4.0000 mg | ORAL_TABLET | Freq: Once | ORAL | Status: DC

## 2013-07-23 MED ORDER — SODIUM CHLORIDE 0.9 % IJ SOLN: 9.0000 mL | INTRAMUSCULAR | Status: DC | PRN

## 2013-07-23 MED ORDER — METOPROLOL TARTRATE 25 MG PO TABS
25.0000 mg | ORAL_TABLET | Freq: Two times a day (BID) | ORAL | Status: DC
  Administered 2013-07-23: 25 mg via ORAL
  Filled 2013-07-23: qty 1

## 2013-07-23 MED ORDER — ONDANSETRON HCL 4 MG/2ML IJ SOLN: 4.0000 mg | Freq: Four times a day (QID) | INTRAMUSCULAR | Status: DC | PRN

## 2013-07-23 MED ORDER — DIPHENHYDRAMINE HCL 25 MG PO CAPS: 25.0000 mg | ORAL_CAPSULE | Freq: Four times a day (QID) | ORAL | Status: DC | PRN

## 2013-07-23 MED ORDER — HYDROMORPHONE HCL PF 2 MG/ML IJ SOLN
1.9000 mg | Freq: Once | INTRAMUSCULAR | Status: AC
  Administered 2013-07-23: 1.9 mg via INTRAVENOUS
  Filled 2013-07-23: qty 1

## 2013-07-23 MED ORDER — HYDROMORPHONE HCL 4 MG PO TABS
ORAL_TABLET | ORAL | Status: AC
  Filled 2013-07-23: qty 1

## 2013-07-23 MED ORDER — HYDROMORPHONE HCL PF 2 MG/ML IJ SOLN
1.5000 mg | Freq: Once | INTRAMUSCULAR | Status: AC
  Administered 2013-07-23: 1.5 mg via INTRAVENOUS
  Filled 2013-07-23: qty 1

## 2013-07-23 MED ORDER — SODIUM CHLORIDE 0.9 % IJ SOLN
10.0000 mL | INTRAMUSCULAR | Status: AC | PRN
  Administered 2013-07-23: 10 mL

## 2013-07-23 MED ORDER — RIVAROXABAN 20 MG PO TABS
20.0000 mg | ORAL_TABLET | Freq: Every day | ORAL | Status: DC
  Filled 2013-07-23: qty 1

## 2013-07-23 MED ORDER — FOLIC ACID 1 MG PO TABS
1.0000 mg | ORAL_TABLET | Freq: Every morning | ORAL | Status: DC
  Administered 2013-07-23: 1 mg via ORAL
  Filled 2013-07-23: qty 1

## 2013-07-23 MED ORDER — LISINOPRIL 10 MG PO TABS
10.0000 mg | ORAL_TABLET | Freq: Every day | ORAL | Status: DC
  Administered 2013-07-23: 10 mg via ORAL
  Filled 2013-07-23: qty 1

## 2013-07-23 MED ORDER — HYDROMORPHONE 0.3 MG/ML IV SOLN
INTRAVENOUS | Status: DC
  Administered 2013-07-23: 11:00:00 via INTRAVENOUS
  Administered 2013-07-23: 6.39 mg via INTRAVENOUS
  Administered 2013-07-23: 7.4 mg via INTRAVENOUS
  Administered 2013-07-23: 1.6 mg via INTRAVENOUS
  Administered 2013-07-23 (×2): via INTRAVENOUS
  Filled 2013-07-23 (×3): qty 25

## 2013-07-23 MED ORDER — KETOROLAC TROMETHAMINE 30 MG/ML IJ SOLN
30.0000 mg | Freq: Once | INTRAMUSCULAR | Status: AC
  Administered 2013-07-23: 30 mg via INTRAVENOUS
  Filled 2013-07-23: qty 1

## 2013-07-23 MED ORDER — MORPHINE SULFATE ER 15 MG PO TBCR: 15.0000 mg | EXTENDED_RELEASE_TABLET | Freq: Two times a day (BID) | ORAL | Status: DC

## 2013-07-23 MED ORDER — POTASSIUM CHLORIDE CRYS ER 20 MEQ PO TBCR
20.0000 meq | EXTENDED_RELEASE_TABLET | Freq: Every morning | ORAL | Status: DC
  Administered 2013-07-23: 20 meq via ORAL
  Filled 2013-07-23: qty 1

## 2013-07-23 NOTE — Telephone Encounter (Signed)
Called patient and advised that I spoke with Dr. Zigmund Daniel and it is ok for him to come to [facility].  Patient verbalizes understanding.

## 2013-07-23 NOTE — Discharge Instructions (Signed)
Sickle Cell Anemia, Adult °Sickle cell anemia is a condition in which red blood cells have an abnormal "sickle" shape. This abnormal shape shortens the cells' life span, which results in a lower than normal concentration of red blood cells in the blood. The sickle shape also causes the cells to clump together and block free blood flow through the blood vessels. As a result, the tissues and organs of the body do not receive enough oxygen. Sickle cell anemia causes organ damage and pain and increases the risk of infection. °CAUSES  °Sickle cell anemia is a genetic disorder. Those who receive two copies of the gene have the condition, and those who receive one copy have the trait. °RISK FACTORS °The sickle cell gene is most common in people whose families originated in [place]. Other areas of the globe where sickle cell trait occurs include the Mediterranean, [place] and [place], the [place], and the [place].  °SIGNS AND SYMPTOMS °· Pain, especially in the extremities, back, chest, or abdomen (common). The pain may start suddenly or may develop following an illness, especially if there is dehydration. Pain can also occur due to overexertion or exposure to extreme temperature changes. °· Frequent severe bacterial infections, especially certain types of pneumonia and meningitis. °· Pain and swelling in the hands and feet. °· Decreased activity.   °· Loss of appetite.   °· Change in behavior. °· Headaches. °· Seizures. °· Shortness of breath or difficulty breathing. °· Vision changes. °· Skin ulcers. °Those with the trait may not have symptoms or they may have mild symptoms.  °DIAGNOSIS  °Sickle cell anemia is diagnosed with blood tests that demonstrate the genetic trait. It is often diagnosed during the newborn period, due to mandatory testing nationwide. A variety of blood tests, X-rays, CT scans, MRI scans, ultrasounds, and lung function tests may also be done to monitor the condition. °TREATMENT  °Sickle  cell anemia may be treated with: °· Medicines. You may be given pain medicines, antibiotic medicines (to treat and prevent infections) or medicines to increase the production of certain types of hemoglobin. °· Fluids. °· Oxygen. °· Blood transfusions. °HOME CARE INSTRUCTIONS  °· Drink enough fluid to keep your urine clear or pale yellow. Increase your fluid intake in hot weather and during exercise. °· Do not smoke. Smoking lowers oxygen levels in the blood.   °· Only take over-the-counter or prescription medicines for pain, fever, or discomfort as directed by your health care provider. °· Take antibiotics as directed by your health care provider. Make sure you finish them it even if you start to feel better.   °· Take supplements as directed by your health care provider.   °· Consider wearing a medical alert bracelet. This tells anyone caring for you in an emergency of your condition.   °· When traveling, keep your medical information, health care provider's names, and the medicines you take with you at all times.   °· If you develop a fever, do not take medicines to reduce the fever right away. This could cover up a problem that is developing. Notify your health care provider. °· Keep all follow-up appointments with your health care provider. Sickle cell anemia requires regular medical care. °SEEK MEDICAL CARE IF: ° You have a fever. °SEEK IMMEDIATE MEDICAL CARE IF:  °· You feel dizzy or faint.   °· You have new abdominal pain, especially on the left side near the stomach area.   °· You develop a persistent, often uncomfortable and painful penile erection (priapism). If this is not treated immediately it   will lead to impotence.   You have numbness your arms or legs or you have a hard time moving them.   You have a hard time with speech.   You have a fever or persistent symptoms for more than 2-3 days.   You have a fever and your symptoms suddenly get worse.   You have signs or symptoms of infection.  These include:   Chills.   Abnormal tiredness (lethargy).   Irritability.   Poor eating.   Vomiting.   You develop pain that is not helped with medicine.   You develop shortness of breath.  You have pain in your chest.   You are coughing up pus-like or bloody sputum.   You develop a stiff neck.  Your feet or hands swell or have pain.  Your abdomen appears bloated.  You develop joint pain. MAKE SURE YOU:  Understand these instructions.  Will watch your child's condition.  Will get help right away if your child is not doing well or gets worse. Document Released: 04/27/2005 Document Revised: 11/07/2012 Document Reviewed: 08/29/2012 [facility] Patient Information 2015 [place], [place]. This information is not intended to replace advice given to you by your health care provider. Make sure you discuss any questions you have with your health care provider.

## 2013-07-23 NOTE — Discharge Summary (Signed)
[facility] Discharge Summary   Patient ID: [name] MRN: [id number] DOB/AGE: 34/34/1982 34 y.o.  Admit date: 07/23/2013 Discharge date: 07/23/2013  Primary Care Physician:  [name],[name], MD  Admission Diagnoses:  Active Problems:   Pulmonary HTN   Sickle cell anemia with pain   Discharge Diagnoses:   Sickle Cell anemia  Discharge Medications:    Medication List    ASK your doctor about these medications       folic acid 1 MG tablet  Commonly known as:  FOLVITE  Take 1 tablet (1 mg total) by mouth every morning.     HYDROmorphone 4 MG tablet  Commonly known as:  DILAUDID  Take 1 tablet (4 mg total) by mouth every 4 (four) hours as needed for severe pain.     lisinopril 10 MG tablet  Commonly known as:  PRINIVIL,ZESTRIL  Take 1 tablet (10 mg total) by mouth daily.     metoprolol tartrate 25 MG tablet  Commonly known as:  LOPRESSOR  Take 1 tablet (25 mg total) by mouth 2 (two) times daily.     morphine 15 MG 12 hr tablet  Commonly known as:  MS CONTIN  Take 1 tablet (15 mg total) by mouth 2 (two) times daily.     potassium chloride SA 20 MEQ tablet  Commonly known as:  K-DUR,KLOR-CON  Take 1 tablet (20 mEq total) by mouth every morning.     XARELTO 20 MG Tabs tablet  Generic drug:  rivaroxaban  Take 20 mg by mouth daily with supper.     zolpidem 10 MG tablet  Commonly known as:  AMBIEN  Take 1 tablet (10 mg total) by mouth at bedtime as needed for sleep.         Consults:  None  Significant Diagnostic Studies:  Dg Chest 2 View  06/26/2013   CLINICAL DATA:  Sickle cell disease.  Mid upper chest pain.  EXAM: CHEST  2 VIEW  COMPARISON:  Chest radiograph performed 06/18/2013  FINDINGS: The lungs are well-aerated. Mild vascular congestion is noted. A small peripheral focus of airspace opacity at the right midlung zone is again seen. This may reflect infection or possibly changes of acute chest syndrome. There is no evidence of  pleural effusion or pneumothorax.  The heart is mildly enlarged. A left-sided chest port is noted ending about the distal SVC. No acute osseous abnormalities are seen. Clips are noted within the right upper quadrant, reflecting prior cholecystectomy.  IMPRESSION: Mild vascular congestion and mild cardiomegaly noted. Small peripheral focus of airspace opacity again noted at the right midlung zone, which may reflect infection or possibly changes of acute chest syndrome. This is slightly less prominent than on the prior study.   Electronically Signed   By: Garald Balding M.D.   On: 06/26/2013 06:31     [facility] Course: Patient was [facility] to [facility] cell day hospital for extended observation.  Checked CBC and CMP, platelet count has normalized since holding hydrea.  Patient will return to clinic for evaluation by Dr. Zigmund Daniel, scheduler will notify patient on 07/24/2013 with an appointment.   Started IV Toradol and IV dilaudid per weight-based rapid re-dosing. Pain intensity remained increased.  Started dilaudid PCA per weight based protocol. He used a total of 15.39 mg with 20 demands and 20 deliveries.  Patient is functional and states that he can manage at home on medication regimen. Patient is alert, oriented and ambulatory. Patient to follow up  in office as discussed. Discussed patient's compliance with office appointments and specialty appointments. Patient states that he is unable to make it to appointments that are scheduled at the end of the week due to job constraints. Informed patient that it is his responsibility to follow through with appointments and reschedule if he has an issue with appt times.  Discharged home with brother in stable condition.     Physical Exam at Discharge:  BP 124/72  Pulse 68  Temp(Src) 97.4 F (36.3 C) (Oral)  Resp 22  Ht 6' (1.829 m)  Wt 168 lb (76.204 kg)  BMI 22.78 kg/m2  SpO2 97% General appearance: alert, cooperative, icteric and mild  distress  Back: no tenderness to percussion or palpation, symmetric, no curvature. ROM normal. No CVA tenderness.  Lungs: clear to auscultation bilaterally  Chest wall: Mild tenderness to left chest on palpation Heart: regularly irregular rhythm  Abdomen: soft, non-tender; bowel sounds normal; no masses, no organomegaly  Extremities: extremities normal, atraumatic, no cyanosis or edema  Pulses: 2+ and symmetric  Neurologic: Alert and oriented X 3, normal strength and tone. Normal symmetric reflexes. Normal coordination and gait       Disposition at Discharge: 01-Home or Self Care  Discharge Orders:   Condition at Discharge:   Stable  Time spent on Discharge:  Greater than 30 minutes.  Signed: Hollis,Lachina M 07/23/2013, 5:46 PM

## 2013-07-23 NOTE — Telephone Encounter (Signed)
Patient called in requesting to come to [facility].  Patient C/O pain to upper body and legs that is 9/10 on pain scale.  Patients states he took MS Contin and Dilaudid around 7:00a.m this morning.  Patient denies fever, chest pain or shortness of breath, nausea/vomiting, diarrhea or abdominal pain.  Patient also denies priapism.  I explained I would notify the physician and give him a call back.  Patient verbalizes understanding.

## 2013-07-23 NOTE — Progress Notes (Signed)
Patient ID: [name], male   DOB: 30-Dec-1980, 33 y.o.   MRN: [id number] Discharge instructions given to patient, port flushed and de-accessed per protocol.  Belongings returned.  Per patient, his brother is here to transport him home.

## 2013-07-23 NOTE — H&P (Signed)
[place] History and Physical  COVEY BALLER WUJ:[id number] DOB: 22-Jun-1980 DOA: 07/23/2013   PCP: [name],[name], MD   Chief Complaint: Pain in back and chest wall for 2 days.   HPI:Pt with Hb SS and well known to [place] with a recent history of Acute Chest syndrome and demand ischemia who presents with 3 days of pain which was not controlled with oral medications. Patient called yesterday for access to the [facility] but did not come as instructed as he reports that he could not get away from work. He describes pain as throbbing  and occasionally sharp. It is localized to the back and chest wall and non-radiating. He has no other associated symptoms.   Patient has missed his last 2 clinic appointments.    Review of Systems:  Constitutional: No weight loss, night sweats, Fevers, chills, fatigue.  HEENT: No headaches, dizziness, seizures, vision changes, difficulty swallowing,Tooth/dental problems,Sore throat, No sneezing, itching, ear ache, nasal congestion, post nasal drip,  Cardio-vascular: No chest pain, Orthopnea, PND, swelling in lower extremities, anasarca, dizziness, palpitations  GI: No heartburn, indigestion, abdominal pain, nausea, vomiting, diarrhea, change in bowel habits, loss of appetite  Resp: No shortness of breath with exertion or at rest. No excess mucus, no productive cough, No non-productive cough, No coughing up of blood.No change in color of mucus.No wheezing.No chest wall deformity  Skin: no rash or lesions.  GU: no dysuria, change in color of urine, no urgency or frequency. No flank pain.   Psych: No change in mood or affect. No depression or anxiety. No memory loss.    Past Medical History  Diagnosis Date  . Sickle cell anemia   . Blood transfusion   . 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 Hypokalemia   . Mood disorder   . History of pulmonary embolus (PE)   . Avascular necrosis   . Leukocytosis      Chronic  . Thrombocytosis     Chronic  . Hypertension   . History of Clostridium difficile infection   . Uses marijuana   . Chronic anticoagulation   . Functional asplenia   . Former smoker   . Second hand tobacco smoke exposure   . Alcohol consumption of one to four drinks per day    Past Surgical History  Procedure Laterality Date  . Right hip replacement      08/2006  . Cholecystectomy      01/2008  . Porta cath placement    . Porta cath removal    . Umbilical hernia repair      01/2008  . Excision of left periauricular cyst      10/2009  . Excision of right ear lobe cyst with primary closur      11/2007  . Portacath placement  01/05/2012    Procedure: INSERTION PORT-A-CATH;  Surgeon: Odis Hollingshead, MD;  Location: [place];  Service: General;  Laterality: N/A;  ultrasound guiced port a cath insertion with fluoroscopy   Social History:  reports that he quit smoking about 3 years ago. His smoking use included Cigarettes. He smoked 0.00 packs per day for 13 years. He has never used smokeless tobacco. He reports that he drinks about 3.6 ounces of alcohol per week. He reports that he uses illicit drugs (Marijuana) about twice per week.  Allergies  Allergen Reactions  . Morphine And Related Hives and Rash    Pt states he can take PO, but not  IV and he is able to tolerate Dilaudid with no reactions.    Family History  Problem Relation Age of Onset  . Sickle cell anemia Mother   . Sickle cell anemia Father   . Sickle cell trait Brother     Prior to Admission medications   Medication Sig Start Date End Date Taking? Authorizing Provider  folic acid (FOLVITE) 1 MG tablet Take 1 tablet (1 mg total) by mouth every morning. 06/05/12  Yes [name], MD  HYDROmorphone (DILAUDID) 4 MG tablet Take 1 tablet (4 mg total) by mouth every 4 (four) hours as needed for severe pain. 06/27/13  Yes Dorena Dew, FNP  lisinopril (PRINIVIL,ZESTRIL) 10 MG tablet Take 1 tablet (10 mg  total) by mouth daily. 06/20/13  Yes Elwyn Reach, MD  metoprolol tartrate (LOPRESSOR) 25 MG tablet Take 1 tablet (25 mg total) by mouth 2 (two) times daily. 06/20/13  Yes Elwyn Reach, MD  morphine (MS CONTIN) 15 MG 12 hr tablet Take 1 tablet (15 mg total) by mouth 2 (two) times daily. 06/27/13  Yes Dorena Dew, FNP  potassium chloride SA (K-DUR,KLOR-CON) 20 MEQ tablet Take 1 tablet (20 mEq total) by mouth every morning. 03/30/13  Yes Elwyn Reach, MD  Rivaroxaban (XARELTO) 20 MG TABS tablet Take 20 mg by mouth daily with supper.   Yes Historical Provider, MD  zolpidem (AMBIEN) 10 MG tablet Take 1 tablet (10 mg total) by mouth at bedtime as needed for sleep. 06/27/13  Yes Dorena Dew, FNP   Physical Exam: Filed Vitals:   07/23/13 0857  BP: 133/87  Pulse: 93  Temp: 98.6 F (37 C)  TempSrc: Oral  Resp: 20  Height: 6' (1.829 m)  Weight: 168 lb (76.204 kg)  SpO2: 97%   BP 133/87  Pulse 93  Temp(Src) 98.6 F (37 C) (Oral)  Resp 20  Ht 6' (1.829 m)  Wt 168 lb (76.204 kg)  BMI 22.78 kg/m2  SpO2 97%  General Appearance:    Alert, cooperative, no distress, appears stated age  Head:    Normocephalic, without obvious abnormality, atraumatic  Eyes:    PERRL, conjunctiva/corneas clear, EOM's intact, fundi    benign, both eyes anicteric.       Throat:   Lips, mucosa, and tongue normal; teeth and gums normal  Neck:   Supple, symmetrical, trachea midline, no adenopathy;       thyroid:  No enlargement/tenderness/nodules; no carotid   bruit or JVD  Back:     Symmetric, no curvature, ROM normal, no CVA tenderness  Lungs:     Clear to auscultation bilaterally, respirations unlabored  Chest wall:    No tenderness or deformity  Heart:    Regular rate and rhythm, S1 and S2 normal, no murmur, rub   or gallop  Abdomen:     Soft, non-tender, bowel sounds active all four quadrants,    no masses, no organomegaly  Extremities:   Extremities normal, atraumatic, no cyanosis or edema   Pulses:   2+ and symmetric all extremities  Skin:   Skin color, texture, turgor normal, no rashes or lesions  Neurologic:   CNII-XII intact. Normal strength, sensation and reflexes      throughout    Labs on Admission:   Basic Metabolic Panel:  Recent Labs Lab 07/19/13 0504  NA 137  K 3.5*  CL 103  CO2 23  GLUCOSE 117*  BUN 12  CREATININE 0.85  CALCIUM 8.5   Liver  Function Tests:  Recent Labs Lab 07/19/13 0504  AST 22  ALT 17  ALKPHOS 88  BILITOT 3.2*  PROT 7.2  ALBUMIN 3.7   CBC:  Recent Labs Lab 07/19/13 0504 07/23/13 0916  WBC 18.5* 16.2*  NEUTROABS 12.5* 12.4*  HGB 7.8* 8.2*  HCT 23.4* 24.0*  MCV 95.1 92.3  PLT 384 349     Assessment/Plan: Active Problems: 1. Hb SS with early acute crisis: Pt will be treated with initially treated with weight based rapid re-dosing with Dilaudid,  IVF and Toradol . If pain is above 7/10 then Pt will be transitioned to a weight based Dilaudid PCA . Patient will be re-evaluated for pain in the context of function and relationship to baseline as care progresses.  2. HTN: Continue Lisinopril and Metoprolol.  3. Non-compliance with medical care: Pt has been non-compliant with keeping appointments. Pt is not balancing his activity with manangement of health care. I have had a long discussion with patient about the importance of keeping his clinic appointments and the fact  Hat we are unable to help him with his health if he continues to miss his appointments. He has not identified any barriers to coming to his appointments except that he forgets.     Time spend: 40 minutes Code Status: Full Code Family Communication: N/A Disposition Plan: Not yet determined  [name],[name], MD  Pager [phone]  If 7PM-7AM, please contact night-coverage [email or web address] Password [facility] 07/23/2013, 9:43 AM

## 2013-07-27 ENCOUNTER — Emergency Department (HOSPITAL_COMMUNITY)
Admission: EM | Admit: 2013-07-27 | Discharge: 2013-07-27 | Disposition: A | Discharge status: Home / Self Care | Payer: Medicare Other | Source: Home / Self Care | Attending: Emergency Medicine | Admitting: Emergency Medicine

## 2013-07-27 ENCOUNTER — Encounter (HOSPITAL_COMMUNITY): Payer: Self-pay | Admitting: Emergency Medicine

## 2013-07-27 DIAGNOSIS — I10 Essential (primary) hypertension: Secondary | ICD-10-CM

## 2013-07-27 DIAGNOSIS — Z7901 Long term (current) use of anticoagulants: Secondary | ICD-10-CM | POA: Insufficient documentation

## 2013-07-27 DIAGNOSIS — D5700 Hb-SS disease with crisis, unspecified: Principal | ICD-10-CM | POA: Diagnosis present

## 2013-07-27 DIAGNOSIS — F1210 Cannabis abuse, uncomplicated: Secondary | ICD-10-CM | POA: Diagnosis present

## 2013-07-27 DIAGNOSIS — E876 Hypokalemia: Secondary | ICD-10-CM | POA: Insufficient documentation

## 2013-07-27 DIAGNOSIS — Z79899 Other long term (current) drug therapy: Secondary | ICD-10-CM | POA: Insufficient documentation

## 2013-07-27 DIAGNOSIS — Z87891 Personal history of nicotine dependence: Secondary | ICD-10-CM | POA: Insufficient documentation

## 2013-07-27 DIAGNOSIS — Z86718 Personal history of other venous thrombosis and embolism: Secondary | ICD-10-CM

## 2013-07-27 DIAGNOSIS — Z8619 Personal history of other infectious and parasitic diseases: Secondary | ICD-10-CM | POA: Insufficient documentation

## 2013-07-27 DIAGNOSIS — Z86711 Personal history of pulmonary embolism: Secondary | ICD-10-CM

## 2013-07-27 DIAGNOSIS — R079 Chest pain, unspecified: Secondary | ICD-10-CM | POA: Diagnosis not present

## 2013-07-27 DIAGNOSIS — Z832 Family history of diseases of the blood and blood-forming organs and certain disorders involving the immune mechanism: Secondary | ICD-10-CM

## 2013-07-27 DIAGNOSIS — Z96649 Presence of unspecified artificial hip joint: Secondary | ICD-10-CM

## 2013-07-27 LAB — RETICULOCYTES
RBC.: 2.71 MIL/uL — ABNORMAL LOW (ref 4.22–5.81)
Retic Count, Absolute: 300.8 10*3/uL — ABNORMAL HIGH (ref 19.0–186.0)
Retic Ct Pct: 11.1 % — ABNORMAL HIGH (ref 0.4–3.1)

## 2013-07-27 LAB — CBC WITH DIFFERENTIAL/PLATELET
BASOS PCT: 1 % (ref 0–1)
Basophils Absolute: 0.1 10*3/uL (ref 0.0–0.1)
EOS PCT: 1 % (ref 0–5)
Eosinophils Absolute: 0.1 10*3/uL (ref 0.0–0.7)
HEMATOCRIT: 24.7 % — AB (ref 39.0–52.0)
HEMOGLOBIN: 8.6 g/dL — AB (ref 13.0–17.0)
Lymphocytes Relative: 13 % (ref 12–46)
Lymphs Abs: 2.2 10*3/uL (ref 0.7–4.0)
MCH: 31.7 pg (ref 26.0–34.0)
MCHC: 34.8 g/dL (ref 30.0–36.0)
MCV: 91.1 fL (ref 78.0–100.0)
MONO ABS: 2.1 10*3/uL — AB (ref 0.1–1.0)
MONOS PCT: 13 % — AB (ref 3–12)
NEUTROS ABS: 12.3 10*3/uL — AB (ref 1.7–7.7)
Neutrophils Relative %: 73 % (ref 43–77)
Platelets: 425 10*3/uL — ABNORMAL HIGH (ref 150–400)
RBC: 2.71 MIL/uL — ABNORMAL LOW (ref 4.22–5.81)
RDW: 18.9 % — AB (ref 11.5–15.5)
WBC: 16.8 10*3/uL — ABNORMAL HIGH (ref 4.0–10.5)

## 2013-07-27 LAB — URINALYSIS, ROUTINE W REFLEX MICROSCOPIC
Bilirubin Urine: NEGATIVE
Glucose, UA: NEGATIVE mg/dL
Hgb urine dipstick: NEGATIVE
Ketones, ur: NEGATIVE mg/dL
LEUKOCYTES UA: NEGATIVE
NITRITE: NEGATIVE
PROTEIN: 30 mg/dL — AB
Specific Gravity, Urine: 1.012 (ref 1.005–1.030)
UROBILINOGEN UA: 1 mg/dL (ref 0.0–1.0)
pH: 7 (ref 5.0–8.0)

## 2013-07-27 LAB — COMPREHENSIVE METABOLIC PANEL
ALT: 21 U/L (ref 0–53)
AST: 33 U/L (ref 0–37)
Albumin: 4.3 g/dL (ref 3.5–5.2)
Alkaline Phosphatase: 95 U/L (ref 39–117)
BILIRUBIN TOTAL: 5.5 mg/dL — AB (ref 0.3–1.2)
BUN: 5 mg/dL — AB (ref 6–23)
CO2: 21 mEq/L (ref 19–32)
CREATININE: 0.54 mg/dL (ref 0.50–1.35)
Calcium: 8.9 mg/dL (ref 8.4–10.5)
Chloride: 105 mEq/L (ref 96–112)
GFR calc non Af Amer: >90 mL/min (ref >90)
GLUCOSE: 103 mg/dL — AB (ref 70–99)
POTASSIUM: 3.6 meq/L — AB (ref 3.7–5.3)
Sodium: 141 mEq/L (ref 137–147)
TOTAL PROTEIN: 8.2 g/dL (ref 6.0–8.3)

## 2013-07-27 LAB — URINE MICROSCOPIC-ADD ON

## 2013-07-27 MED ORDER — HEPARIN SOD (PORK) LOCK FLUSH 100 UNIT/ML IV SOLN
500.0000 [IU] | Freq: Once | INTRAVENOUS | Status: AC
  Administered 2013-07-27: 500 [IU]
  Filled 2013-07-27: qty 5

## 2013-07-27 MED ORDER — HYDROMORPHONE HCL PF 2 MG/ML IJ SOLN
2.0000 mg | INTRAMUSCULAR | Status: DC | PRN
  Administered 2013-07-27: 2 mg via INTRAVENOUS
  Filled 2013-07-27: qty 1

## 2013-07-27 MED ORDER — HYDROMORPHONE HCL PF 2 MG/ML IJ SOLN
2.0000 mg | Freq: Once | INTRAMUSCULAR | Status: AC
  Administered 2013-07-27: 2 mg via INTRAVENOUS
  Filled 2013-07-27: qty 1

## 2013-07-27 MED ORDER — DIPHENHYDRAMINE HCL 25 MG PO CAPS
25.0000 mg | ORAL_CAPSULE | Freq: Once | ORAL | Status: AC
  Administered 2013-07-27: 25 mg via ORAL
  Filled 2013-07-27: qty 1

## 2013-07-27 MED ORDER — OXYCODONE-ACETAMINOPHEN 5-325 MG PO TABS
2.0000 | ORAL_TABLET | Freq: Once | ORAL | Status: AC
  Administered 2013-07-27: 2 via ORAL
  Filled 2013-07-27: qty 2

## 2013-07-27 NOTE — ED Notes (Signed)
PA  Lisa at bedside. 

## 2013-07-27 NOTE — ED Notes (Signed)
Patient c/o bilateral leg pain, typical sickle cell pain as per patient. Patient denies SOB and chest pain.

## 2013-07-27 NOTE — ED Provider Notes (Signed)
Medical screening examination/treatment/procedure(s) were performed by non-physician practitioner and as supervising physician I was immediately available for consultation/collaboration.   EKG Interpretation None        Hoy Morn, MD 07/27/13 2253

## 2013-07-27 NOTE — ED Provider Notes (Signed)
CSN: [id number]     Arrival date & time 07/27/13  1215 History   First MD Initiated Contact with Patient 07/27/13 1505     Chief Complaint  Patient presents with  . Sickle Cell Pain Crisis     (Consider location/radiation/quality/duration/timing/severity/associated sxs/prior Treatment) The history is provided by the patient and medical records.   This is a 33 year old male with past medical history significant for sickle cell anemia, hypertension, DVT and PE currently on xarelto, presenting to the ED for sickle cell pain crisis. Patient states symptoms began last night and were unrelieved with his home Dilaudid and MS Contin. He complains of pain in his bilateral lower extremities and bilateral hips. He denies any chest pain, shortness of breath, palpitations, dizziness, weakness, fever, chills, cough, or other URI symptoms.  No abdominal pain, nausea, vomiting, or diarrhea.   VS stable on arrival. PCP-- Zigmund Daniel  Past Medical History  Diagnosis Date  . Sickle cell anemia   . Blood transfusion   . 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 Hypokalemia   . Mood disorder   . History of pulmonary embolus (PE)   . Avascular necrosis   . Leukocytosis     Chronic  . Thrombocytosis     Chronic  . Hypertension   . History of Clostridium difficile infection   . Uses marijuana   . Chronic anticoagulation   . Functional asplenia   . Former smoker   . Second hand tobacco smoke exposure   . Alcohol consumption of one to four drinks per day    Past Surgical History  Procedure Laterality Date  . Right hip replacement      08/2006  . Cholecystectomy      01/2008  . Porta cath placement    . Porta cath removal    . Umbilical hernia repair      01/2008  . Excision of left periauricular cyst      10/2009  . Excision of right ear lobe cyst with primary closur      11/2007  . Portacath placement  01/05/2012    Procedure: INSERTION PORT-A-CATH;  Surgeon: Odis Hollingshead, MD;  Location: [place];  Service: General;  Laterality: N/A;  ultrasound guiced port a cath insertion with fluoroscopy   Family History  Problem Relation Age of Onset  . Sickle cell anemia Mother   . Sickle cell anemia Father   . Sickle cell trait Brother    History  Substance Use Topics  . Smoking status: Former Smoker -- 13 years    Types: Cigarettes    Quit date: 07/08/2010  . Smokeless tobacco: Never Used  . Alcohol Use: 3.6 oz/week    6 Shots of liquor per week    Review of Systems  Musculoskeletal: Positive for arthralgias.  All other systems reviewed and are negative.     Allergies  Morphine and related  Home Medications   Prior to Admission medications   Medication Sig Start Date End Date Taking? Authorizing Provider  folic acid (FOLVITE) 1 MG tablet Take 1 tablet (1 mg total) by mouth every morning. 06/05/12  Yes [name], MD  HYDROmorphone (DILAUDID) 4 MG tablet Take 1 tablet (4 mg total) by mouth every 4 (four) hours as needed for severe pain. 06/27/13  Yes Dorena Dew, FNP  lisinopril (PRINIVIL,ZESTRIL) 10 MG tablet Take 1 tablet (10 mg total) by mouth daily. 06/20/13  Yes Elwyn Reach, MD  metoprolol tartrate (  LOPRESSOR) 25 MG tablet Take 1 tablet (25 mg total) by mouth 2 (two) times daily. 06/20/13  Yes Elwyn Reach, MD  morphine (MS CONTIN) 15 MG 12 hr tablet Take 1 tablet (15 mg total) by mouth 2 (two) times daily. 06/27/13  Yes Dorena Dew, FNP  potassium chloride SA (K-DUR,KLOR-CON) 20 MEQ tablet Take 1 tablet (20 mEq total) by mouth every morning. 03/30/13  Yes Elwyn Reach, MD  Rivaroxaban (XARELTO) 20 MG TABS tablet Take 20 mg by mouth daily with supper.   Yes Historical Provider, MD  zolpidem (AMBIEN) 10 MG tablet Take 1 tablet (10 mg total) by mouth at bedtime as needed for sleep. 06/27/13  Yes Dorena Dew, FNP   BP 128/87  Pulse 65  Temp(Src) 100.1 F (37.8 C) (Oral)  Resp 16  Ht 6' (1.829 m)  Wt 170 lb  (77.111 kg)  BMI 23.05 kg/m2  SpO2 95%  Physical Exam  Nursing note and vitals reviewed. Constitutional: He is oriented to person, place, and time. He appears well-developed and well-nourished.  HENT:  Head: Normocephalic and atraumatic.  Mouth/Throat: Oropharynx is clear and moist.  Eyes: Conjunctivae and EOM are normal. Pupils are equal, round, and reactive to light.  Neck: Normal range of motion.  Cardiovascular: Normal rate, regular rhythm and normal heart sounds.   Pulmonary/Chest: Effort normal and breath sounds normal. No respiratory distress. He has no decreased breath sounds. He has no wheezes. He has no rhonchi. He has no rales.  Port a cath left chest wall that has been accessed; no surrounding erythema or signs of infection  Abdominal: Soft. Bowel sounds are normal.  Musculoskeletal: Normal range of motion. He exhibits tenderness (bilateral hips and LE).  No deformities of pelvis or LE; NVI bilaterally  Neurological: He is alert and oriented to person, place, and time.  Skin: Skin is warm and dry.  Psychiatric: He has a normal mood and affect.    ED Course  Procedures (including critical care time) Labs Review Labs Reviewed  CBC WITH DIFFERENTIAL - Abnormal; Notable for the following:    WBC 16.8 (*)    RBC 2.71 (*)    Hemoglobin 8.6 (*)    HCT 24.7 (*)    RDW 18.9 (*)    Platelets 425 (*)    Neutro Abs 12.3 (*)    Monocytes Relative 13 (*)    Monocytes Absolute 2.1 (*)    All other components within normal limits  COMPREHENSIVE METABOLIC PANEL - Abnormal; Notable for the following:    Potassium 3.6 (*)    Glucose, Bld 103 (*)    BUN 5 (*)    Total Bilirubin 5.5 (*)    All other components within normal limits  RETICULOCYTES - Abnormal; Notable for the following:    Retic Ct Pct 11.1 (*)    RBC. 2.71 (*)    Retic Count, Manual 300.8 (*)    All other components within normal limits  URINALYSIS, ROUTINE W REFLEX MICROSCOPIC    Imaging Review No results  found.   EKG Interpretation None      MDM   Final diagnoses:  Sickle cell pain crisis   33 year old male with sickle cell pain crisis unrelieved with home meds.  He denies any chest pain, shortness of breath, palpitations, cough or fever to suggest acute chest syndrome.  He is complaint with his xarelto. Labs pending at this time.  Will aim for pain control and likely discharge home.  6:01 PM Labs  reassuring--H/H stable.  Pt has received 2 doses of dilaudid with some improvement of his pain.  He has been in room eating, texting on cell phone and is in no acute distress.  Will give additional dose of meds and discharge home with close PCP FU.  Discussed plan with patient, he/she acknowledged understanding and agreed with plan of care.  Return precautions given for new or worsening symptoms.  Larene Pickett, PA-C 07/27/13 2005

## 2013-07-27 NOTE — Discharge Instructions (Signed)
Continue your home pain meds as directed. Follow-up with Dr. Zigmund Daniel. Return to the ED for new concerns.

## 2013-07-27 NOTE — ED Notes (Signed)
Pt ambulated to restroom with steady gait.

## 2013-07-29 ENCOUNTER — Encounter (HOSPITAL_COMMUNITY): Payer: Self-pay | Admitting: Emergency Medicine

## 2013-07-29 ENCOUNTER — Inpatient Hospital Stay (HOSPITAL_COMMUNITY)
Admission: EM | Admit: 2013-07-29 | Discharge: 2013-07-31 | DRG: 812 | Disposition: A | Discharge status: Home / Self Care | Payer: Medicare Other | Attending: Internal Medicine | Admitting: Internal Medicine

## 2013-07-29 ENCOUNTER — Emergency Department (HOSPITAL_COMMUNITY): Payer: Medicare Other

## 2013-07-29 DIAGNOSIS — Z86711 Personal history of pulmonary embolism: Secondary | ICD-10-CM | POA: Diagnosis not present

## 2013-07-29 DIAGNOSIS — Z7901 Long term (current) use of anticoagulants: Secondary | ICD-10-CM | POA: Diagnosis not present

## 2013-07-29 DIAGNOSIS — Z79899 Other long term (current) drug therapy: Secondary | ICD-10-CM | POA: Diagnosis not present

## 2013-07-29 DIAGNOSIS — Z87891 Personal history of nicotine dependence: Secondary | ICD-10-CM | POA: Diagnosis not present

## 2013-07-29 DIAGNOSIS — Z832 Family history of diseases of the blood and blood-forming organs and certain disorders involving the immune mechanism: Secondary | ICD-10-CM | POA: Diagnosis not present

## 2013-07-29 DIAGNOSIS — D5700 Hb-SS disease with crisis, unspecified: Secondary | ICD-10-CM | POA: Diagnosis not present

## 2013-07-29 DIAGNOSIS — I10 Essential (primary) hypertension: Secondary | ICD-10-CM | POA: Diagnosis not present

## 2013-07-29 DIAGNOSIS — Z96649 Presence of unspecified artificial hip joint: Secondary | ICD-10-CM | POA: Diagnosis not present

## 2013-07-29 DIAGNOSIS — R079 Chest pain, unspecified: Secondary | ICD-10-CM | POA: Diagnosis not present

## 2013-07-29 DIAGNOSIS — Z86718 Personal history of other venous thrombosis and embolism: Secondary | ICD-10-CM | POA: Diagnosis not present

## 2013-07-29 DIAGNOSIS — F1210 Cannabis abuse, uncomplicated: Secondary | ICD-10-CM | POA: Diagnosis present

## 2013-07-29 LAB — CBC WITH DIFFERENTIAL/PLATELET
BASOS PCT: 0 % (ref 0–1)
Basophils Absolute: 0.1 10*3/uL (ref 0.0–0.1)
Eosinophils Absolute: 0.2 10*3/uL (ref 0.0–0.7)
Eosinophils Relative: 1 % (ref 0–5)
HCT: 24.2 % — ABNORMAL LOW (ref 39.0–52.0)
HEMOGLOBIN: 8.5 g/dL — AB (ref 13.0–17.0)
LYMPHS ABS: 2.5 10*3/uL (ref 0.7–4.0)
Lymphocytes Relative: 12 % (ref 12–46)
MCH: 32 pg (ref 26.0–34.0)
MCHC: 35.1 g/dL (ref 30.0–36.0)
MCV: 91 fL (ref 78.0–100.0)
Monocytes Absolute: 2.7 10*3/uL — ABNORMAL HIGH (ref 0.1–1.0)
Monocytes Relative: 14 % — ABNORMAL HIGH (ref 3–12)
NEUTROS PCT: 73 % (ref 43–77)
Neutro Abs: 14.7 10*3/uL — ABNORMAL HIGH (ref 1.7–7.7)
Platelets: 438 10*3/uL — ABNORMAL HIGH (ref 150–400)
RBC: 2.66 MIL/uL — AB (ref 4.22–5.81)
RDW: 18.8 % — ABNORMAL HIGH (ref 11.5–15.5)
WBC: 20.2 10*3/uL — AB (ref 4.0–10.5)

## 2013-07-29 LAB — BASIC METABOLIC PANEL
BUN: 13 mg/dL (ref 6–23)
CO2: 22 mEq/L (ref 19–32)
Calcium: 9 mg/dL (ref 8.4–10.5)
Chloride: 104 mEq/L (ref 96–112)
Creatinine, Ser: 0.56 mg/dL (ref 0.50–1.35)
GFR calc non Af Amer: >90 mL/min (ref >90)
Glucose, Bld: 98 mg/dL (ref 70–99)
Potassium: 3.5 mEq/L — ABNORMAL LOW (ref 3.7–5.3)
Sodium: 139 mEq/L (ref 137–147)

## 2013-07-29 LAB — RETICULOCYTES
RBC.: 2.66 MIL/uL — AB (ref 4.22–5.81)
RETIC COUNT ABSOLUTE: 399 10*3/uL — AB (ref 19.0–186.0)
Retic Ct Pct: 15 % — ABNORMAL HIGH (ref 0.4–3.1)

## 2013-07-29 LAB — MRSA PCR SCREENING: MRSA BY PCR: NEGATIVE

## 2013-07-29 MED ORDER — HYDROMORPHONE HCL PF 1 MG/ML IJ SOLN
1.0000 mg | Freq: Once | INTRAMUSCULAR | Status: AC
  Administered 2013-07-29: 1 mg via INTRAVENOUS
  Filled 2013-07-29: qty 1

## 2013-07-29 MED ORDER — RIVAROXABAN 20 MG PO TABS
20.0000 mg | ORAL_TABLET | Freq: Every day | ORAL | Status: DC
  Administered 2013-07-30: 20 mg via ORAL
  Filled 2013-07-29 (×2): qty 1

## 2013-07-29 MED ORDER — HYDROMORPHONE 0.3 MG/ML IV SOLN
INTRAVENOUS | Status: DC
  Administered 2013-07-29 (×2): via INTRAVENOUS
  Administered 2013-07-29: 4.19 mg via INTRAVENOUS
  Administered 2013-07-29: 16:00:00 via INTRAVENOUS
  Administered 2013-07-29: 1.4 mg via INTRAVENOUS
  Administered 2013-07-29: 0.7 mg via INTRAVENOUS
  Administered 2013-07-29: 7.63 mg via INTRAVENOUS
  Administered 2013-07-30: 3.49 mg via INTRAVENOUS
  Administered 2013-07-30: 4.2 mg via INTRAVENOUS
  Administered 2013-07-30: 13:00:00 via INTRAVENOUS
  Administered 2013-07-30: 7.39 mg via INTRAVENOUS
  Administered 2013-07-30: 4.19 mg via INTRAVENOUS
  Administered 2013-07-30: 19:00:00 via INTRAVENOUS
  Administered 2013-07-30: 2.8 mg via INTRAVENOUS
  Administered 2013-07-31: 03:00:00 via INTRAVENOUS
  Administered 2013-07-31: 2.74 mg via INTRAVENOUS
  Administered 2013-07-31: 4.19 mg via INTRAVENOUS
  Administered 2013-07-31: 4.14 mg via INTRAVENOUS
  Administered 2013-07-31: 09:00:00 via INTRAVENOUS
  Filled 2013-07-29 (×8): qty 25

## 2013-07-29 MED ORDER — ONDANSETRON HCL 4 MG/2ML IJ SOLN: 4.0000 mg | Freq: Four times a day (QID) | INTRAMUSCULAR | Status: DC | PRN

## 2013-07-29 MED ORDER — PROMETHAZINE HCL 25 MG/ML IJ SOLN
12.5000 mg | Freq: Once | INTRAMUSCULAR | Status: AC
  Administered 2013-07-29: 12.5 mg via INTRAVENOUS
  Filled 2013-07-29: qty 1

## 2013-07-29 MED ORDER — DOCUSATE SODIUM 100 MG PO CAPS
100.0000 mg | ORAL_CAPSULE | Freq: Every day | ORAL | Status: DC
  Administered 2013-07-29 – 2013-07-31 (×3): 100 mg via ORAL
  Filled 2013-07-29 (×3): qty 1

## 2013-07-29 MED ORDER — HYDROMORPHONE HCL PF 2 MG/ML IJ SOLN
2.0000 mg | Freq: Once | INTRAMUSCULAR | Status: AC
  Administered 2013-07-29: 2 mg via INTRAVENOUS
  Filled 2013-07-29: qty 1

## 2013-07-29 MED ORDER — FOLIC ACID 1 MG PO TABS
1.0000 mg | ORAL_TABLET | Freq: Every morning | ORAL | Status: DC
  Administered 2013-07-30 – 2013-07-31 (×2): 1 mg via ORAL
  Filled 2013-07-29 (×2): qty 1

## 2013-07-29 MED ORDER — SODIUM CHLORIDE 0.9 % IV BOLUS (SEPSIS)
1000.0000 mL | Freq: Once | INTRAVENOUS | Status: AC
  Administered 2013-07-29: 1000 mL via INTRAVENOUS

## 2013-07-29 MED ORDER — MORPHINE SULFATE ER 15 MG PO TBCR
15.0000 mg | EXTENDED_RELEASE_TABLET | Freq: Two times a day (BID) | ORAL | Status: DC
  Administered 2013-07-29 – 2013-07-31 (×4): 15 mg via ORAL
  Filled 2013-07-29 (×4): qty 1

## 2013-07-29 MED ORDER — DIPHENHYDRAMINE HCL 50 MG/ML IJ SOLN
25.0000 mg | Freq: Once | INTRAMUSCULAR | Status: AC
  Administered 2013-07-29: 25 mg via INTRAVENOUS
  Filled 2013-07-29: qty 1

## 2013-07-29 MED ORDER — DEXTROSE-NACL 5-0.45 % IV SOLN
INTRAVENOUS | Status: DC
  Administered 2013-07-29 – 2013-07-31 (×3): via INTRAVENOUS

## 2013-07-29 MED ORDER — NALOXONE HCL 0.4 MG/ML IJ SOLN: 0.4000 mg | INTRAMUSCULAR | Status: DC | PRN

## 2013-07-29 MED ORDER — SODIUM CHLORIDE 0.9 % IJ SOLN: 9.0000 mL | INTRAMUSCULAR | Status: DC | PRN

## 2013-07-29 MED ORDER — HYDROMORPHONE HCL PF 2 MG/ML IJ SOLN: 2.0000 mg | Freq: Once | INTRAMUSCULAR | Status: DC

## 2013-07-29 MED ORDER — SODIUM CHLORIDE 0.9 % IV SOLN: INTRAVENOUS | Status: DC

## 2013-07-29 MED ORDER — DIPHENHYDRAMINE HCL 25 MG PO CAPS
25.0000 mg | ORAL_CAPSULE | Freq: Four times a day (QID) | ORAL | Status: DC | PRN
  Administered 2013-07-29 – 2013-07-30 (×4): 50 mg via ORAL
  Filled 2013-07-29 (×4): qty 2

## 2013-07-29 MED ORDER — POTASSIUM CHLORIDE CRYS ER 20 MEQ PO TBCR
20.0000 meq | EXTENDED_RELEASE_TABLET | Freq: Every morning | ORAL | Status: DC
  Administered 2013-07-30 – 2013-07-31 (×2): 20 meq via ORAL
  Filled 2013-07-29 (×2): qty 1

## 2013-07-29 MED ORDER — METOPROLOL TARTRATE 25 MG PO TABS
25.0000 mg | ORAL_TABLET | Freq: Two times a day (BID) | ORAL | Status: DC
  Administered 2013-07-29 – 2013-07-31 (×4): 25 mg via ORAL
  Filled 2013-07-29 (×5): qty 1

## 2013-07-29 MED ORDER — LISINOPRIL 10 MG PO TABS
10.0000 mg | ORAL_TABLET | Freq: Every day | ORAL | Status: DC
  Administered 2013-07-30 – 2013-07-31 (×2): 10 mg via ORAL
  Filled 2013-07-29 (×2): qty 1

## 2013-07-29 MED ORDER — ZOLPIDEM TARTRATE 10 MG PO TABS
10.0000 mg | ORAL_TABLET | Freq: Every evening | ORAL | Status: DC | PRN
  Administered 2013-07-29 – 2013-07-31 (×2): 10 mg via ORAL
  Filled 2013-07-29 (×2): qty 1

## 2013-07-29 NOTE — H&P (Signed)
SICKLE CELL INPATIENT ADMISSION History and Physical  [name] RDE:[id number] DOB: [birth year] DOA: 07/29/2013   PCP: [name],[name], MD   Chief Complaint: Sickle cell crisis  UDJ:SHFWYOV was transitioned to the day infusion center from the [facility] ED. Patient was given IV Dilaudid while in the emergency department. Pain intensity was 7/10 upon arrival. Patient was started on Dilaudid PCA per weight based protocol and D5.45 for cellular rehydration. Patient used a total of 6.29 mg with 9 demands and 9 deliveries. Patient reports that pain intensity remained 7/10. Patient felt that he cannot manage at home on current medication regimen. Patient will require a higher level of care. Pt was admitted to inpatient unit for a higher level of care. Last bowel movement was yesterday. Denies fever, cough, SOB, vomiting, diarrhea, rash, and dysuria.   Review of Systems:  Constitutional: No weight loss, night sweats, Fevers, chills, fatigue.  HEENT: No headaches, dizziness, seizures, vision changes, difficulty swallowing,Tooth/dental problems,Sore throat, No sneezing, itching, ear ache, nasal congestion, post nasal drip,  Cardio-vascular: No chest pain, Orthopnea, PND, swelling in lower extremities, anasarca, dizziness, palpitations  GI: No heartburn, indigestion, abdominal pain, nausea, vomiting, diarrhea, change in bowel habits, loss of appetite and constipation Resp: No shortness of breath with exertion or at rest. No excess mucus, no productive cough, No non-productive cough, No coughing up of blood.No change in color of mucus.No wheezing.No chest wall deformity  Skin: no rash or lesions.  GU: no dysuria, change in color of urine, no urgency or frequency. No flank pain.  Musculoskeletal: No joint pain or swelling. No decreased range of motion. No back pain.  Psych: No change in mood or affect. No depression or anxiety. No memory loss.    Past Medical History  Diagnosis Date  . Sickle cell  anemia   . Blood transfusion   . 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 Hypokalemia   . Mood disorder   . History of pulmonary embolus (PE)   . Avascular necrosis   . Leukocytosis     Chronic  . Thrombocytosis     Chronic  . Hypertension   . History of Clostridium difficile infection   . Uses marijuana   . Chronic anticoagulation   . Functional asplenia   . Former smoker   . Second hand tobacco smoke exposure   . Alcohol consumption of one to four drinks per day    Past Surgical History  Procedure Laterality Date  . Right hip replacement      08/2006  . Cholecystectomy      01/2008  . Porta cath placement    . Porta cath removal    . Umbilical hernia repair      01/2008  . Excision of left periauricular cyst      10/2009  . Excision of right ear lobe cyst with primary closur      11/2007  . Portacath placement  01/05/2012    Procedure: INSERTION PORT-A-CATH;  Surgeon: Odis Hollingshead, MD;  Location: [place];  Service: General;  Laterality: N/A;  ultrasound guiced port a cath insertion with fluoroscopy   Social History:  reports that he quit smoking about 3 years ago. His smoking use included Cigarettes. He smoked 0.00 packs per day for 13 years. He has never used smokeless tobacco. He reports that he drinks about 3.6 ounces of alcohol per week. He reports that he uses illicit drugs (Marijuana) about twice per week.  Allergies  Allergen  Reactions  . Morphine And Related Hives and Rash    Pt states he can take PO, but not IV and he is able to tolerate Dilaudid with no reactions.    Family History  Problem Relation Age of Onset  . Sickle cell anemia Mother   . Sickle cell anemia Father   . Sickle cell trait Brother     Prior to Admission medications   Medication Sig Start Date End Date Taking? Authorizing Provider  folic acid (FOLVITE) 1 MG tablet Take 1 tablet (1 mg total) by mouth every morning. 06/05/12  Yes [name], MD   HYDROmorphone (DILAUDID) 4 MG tablet Take 1 tablet (4 mg total) by mouth every 4 (four) hours as needed for severe pain. 06/27/13  Yes Dorena Dew, FNP  lisinopril (PRINIVIL,ZESTRIL) 10 MG tablet Take 1 tablet (10 mg total) by mouth daily. 06/20/13  Yes Elwyn Reach, MD  metoprolol tartrate (LOPRESSOR) 25 MG tablet Take 1 tablet (25 mg total) by mouth 2 (two) times daily. 06/20/13  Yes Elwyn Reach, MD  morphine (MS CONTIN) 15 MG 12 hr tablet Take 1 tablet (15 mg total) by mouth 2 (two) times daily. 06/27/13  Yes Dorena Dew, FNP  potassium chloride SA (K-DUR,KLOR-CON) 20 MEQ tablet Take 1 tablet (20 mEq total) by mouth every morning. 03/30/13  Yes Elwyn Reach, MD  Rivaroxaban (XARELTO) 20 MG TABS tablet Take 20 mg by mouth every morning.    Yes Historical Provider, MD  zolpidem (AMBIEN) 10 MG tablet Take 1 tablet (10 mg total) by mouth at bedtime as needed for sleep. 06/27/13  Yes Dorena Dew, FNP   Physical Exam: Filed Vitals:   07/29/13 1529 07/29/13 1619 07/29/13 1719 07/29/13 1823  BP: 120/81  131/90 125/75  Pulse: 73  93 93  Temp:    98.4 F (36.9 C)  TempSrc:    Oral  Resp: 20  27 20   Height:    6' (1.829 m)  Weight:    170 lb (77.111 kg)  SpO2: 97% 98% 96% 92%   General: Alert, awake, oriented x3, in no acute distress.  HEENT: /AT PEERL, EOMI Neck: Trachea midline,  no masses, no thyromegal,y no JVD, no carotid bruit OROPHARYNX:  Moist, No exudate/ erythema/lesions.  Heart: Regular rate and rhythm, without murmurs Chest: port in upper left chest Lungs: Clear to auscultation, no wheezing or rhonchi noted. No increased vocal fremitus resonant to percussion  Abdomen: Soft, nontender, nondistended, positive bowel sounds, no masses no hepatosplenomegaly noted..  Neuro: No focal neurological deficits noted cranial nerves II through XII grossly intact. Neuro: A+OX3, 5/5 distal strength, nl sensory  Labs on Admission:   Basic Metabolic Panel:  Recent  Labs Lab 07/23/13 0916 07/27/13 1435 07/29/13 1138  NA 137 141 139  K 3.7 3.6* 3.5*  CL 103 105 104  CO2 22 21 22   GLUCOSE 105* 103* 98  BUN 8 5* 13  CREATININE 0.56 0.54 0.56  CALCIUM 8.5 8.9 9.0   Liver Function Tests:  Recent Labs Lab 07/23/13 0916 07/27/13 1435  AST 36 33  ALT 26 21  ALKPHOS 91 95  BILITOT 4.4* 5.5*  PROT 7.3 8.2  ALBUMIN 3.8 4.3   CBC:  Recent Labs Lab 07/23/13 0916 07/27/13 1435 07/29/13 1138  WBC 16.2* 16.8* 20.2*  NEUTROABS 12.4* 12.3* 14.7*  HGB 8.2* 8.6* 8.5*  HCT 24.0* 24.7* 24.2*  MCV 92.3 91.1 91.0  PLT 349 425* 438*   BNP: No  components found with this basename: POCBNP,    Radiological Exams on Admission: Dg Chest 2 View  07/29/2013   CLINICAL DATA:  Sickle cell crisis with chest pain and fever  EXAM: CHEST  2 VIEW  COMPARISON:  PA and lateral chest of Jun 26, 2013  FINDINGS: The lungs remain well-expanded. The interstitial markings are mildly increased bilaterally but less conspicuous than on the earlier study. There is no alveolar pneumonia. The cardiac silhouette remains enlarged. The central pulmonary vascularity is prominent but stable. The Port-A-Cath appliance tip lies in the region of the mid to distal SVC. The bony thorax is unremarkable.  IMPRESSION: There is no pneumonia. There is stable enlargement of the cardiac silhouette. Mild prominence of the interstitial markings has improved since the previous study.   Electronically Signed   By: David  Martinique   On: 07/29/2013 12:11      Assessment/Plan: Active Problems:   Sickle cell crisis Continue Dilaudid PCA, Ketorolac, and MS Contin. Plan to have pt use PCA overnight and likely discharge in the morning.  Sickle Cell Care: Continue Folic acid. Pt not on Hydroxyurea, which should be addressed with PCP after discharge.  H/o Thrombosis: Continue Xarelto, no need for prophylaxis with heparin  HTN: Continue Lisionpril and Metoprolol  Insomnia: continue  Ambien  FEN/GI: Continue IV fluids D5/0.45% NaCl at maintenance rate Regular Diet Bowel regimen with docusate.   Time spend: >1 hour Code Status: Full Family Communication: none Disposition Plan: Pending improvement in pain  Kalman Shan, MD  Pager [phone]  If 7PM-7AM, please contact night-coverage [email or web address] Password [facility] 07/29/2013, 6:43 PM

## 2013-07-29 NOTE — ED Notes (Signed)
Pt here Saturday for sickle cell pain.  Has not improved. Pain over chest, ribs, back. EKG completed

## 2013-07-29 NOTE — Discharge Summary (Signed)
I agree with the above history, physical, assessment and plan. Pt to be admitted for further treatment of sickle cell crisis. Please see admission H&P.

## 2013-07-29 NOTE — ED Provider Notes (Signed)
Medical screening examination/treatment/procedure(s) were conducted as a shared visit with non-physician practitioner(s) and myself.  I personally evaluated the patient during the encounter.  Typical sickle cell pain in ribs and back. No cough or fever.  No SOB.  On xarelto for PE.   EKG Interpretation   Date/Time:  Monday July 29 2013 10:39:32 EDT Ventricular Rate:  86 PR Interval:  167 QRS Duration: 108 QT Interval:  404 QTC Calculation: 483 R Axis:   -73 Text Interpretation:  Sinus rhythm Left atrial enlargement Left anterior  fascicular block RSR' in V1 or V2, probably normal variant Abnormal T,  probable ischemia, anterior leads Compared to previous tracing RSR prime  NOW PRESENT Confirmed by Stevie Kern  MD, Jenny Reichmann ([id number]) on 07/29/2013 10:55:56 AM       Ezequiel Essex, MD 07/29/13 [phone]

## 2013-07-29 NOTE — H&P (Signed)
[facility] History and Physical  [name] RJJ:[id number] DOB: 1980/05/20 DOA: 07/29/2013   PCP: [name],[name], MD   Chief Complaint: Sickle cell crisis  HPI: [name] is a 33 year old male with a history of sickle cell anemia, HbSS with frequent vaso-occlusive crisis and acute chest syndrome complaining of pain primarily to the right rib. He states that pain intensity started increasing on 07/27/2013 and he reported to the emergency department, was treated and released. Patient states that he reported to the emergency department this am due to mild chest pain and rib pain. He maintains that chest pain has dissipated; however rib pain is increasing in intensity.  He reports that he has been taking medications consistently. Patient attributes crisis to weather changes.  Patient reports that pain intensity is 7/10 primarily to right rib described as constant and throbbing. Patient denies headache, dizziness, shortness of breath, chest pains, nausea, vomiting, or diarrhea.   Review of Systems:  Constitutional: No weight loss, night sweats, Fevers, chills, fatigue.  HEENT: No headaches, dizziness, seizures, vision changes, difficulty swallowing,Tooth/dental problems,Sore throat, No sneezing, itching, ear ache, nasal congestion, post nasal drip, Scleral icterus Cardio-vascular: No chest pain, Orthopnea, PND, swelling in lower extremities, anasarca, dizziness, palpitations  GI: No heartburn, indigestion, abdominal pain, nausea, vomiting, diarrhea, change in bowel habits, loss of appetite  Resp: No shortness of breath with exertion or at rest. No excess mucus, no productive cough, No non-productive cough, No coughing up of blood.No change in color of mucus.No wheezing.No chest wall deformity  Skin: no rash or lesions.  GU: no dysuria, change in color of urine, no urgency or frequency. No flank pain.  Musculoskeletal: No joint pain or swelling. No decreased range of  motion. No back pain. Pain to right and left rib (s).  Psych: No change in mood or affect. No depression or anxiety. No memory loss.    Past Medical History  Diagnosis Date  . Sickle cell anemia   . Blood transfusion   . 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 Hypokalemia   . Mood disorder   . History of pulmonary embolus (PE)   . Avascular necrosis   . Leukocytosis     Chronic  . Thrombocytosis     Chronic  . Hypertension   . History of Clostridium difficile infection   . Uses marijuana   . Chronic anticoagulation   . Functional asplenia   . Former smoker   . Second hand tobacco smoke exposure   . Alcohol consumption of one to four drinks per day    Past Surgical History  Procedure Laterality Date  . Right hip replacement      08/2006  . Cholecystectomy      01/2008  . Porta cath placement    . Porta cath removal    . Umbilical hernia repair      01/2008  . Excision of left periauricular cyst      10/2009  . Excision of right ear lobe cyst with primary closur      11/2007  . Portacath placement  01/05/2012    Procedure: INSERTION PORT-A-CATH;  Surgeon: Odis Hollingshead, MD;  Location: [place];  Service: General;  Laterality: N/A;  ultrasound guiced port a cath insertion with fluoroscopy   Social History:  reports that he quit smoking about 3 years ago. His smoking use included Cigarettes. He smoked 0.00 packs per day for 13 years. He has never used  smokeless tobacco. He reports that he drinks about 3.6 ounces of alcohol per week. He reports that he uses illicit drugs (Marijuana) about twice per week.  Allergies  Allergen Reactions  . Morphine And Related Hives and Rash    Pt states he can take PO, but not IV and he is able to tolerate Dilaudid with no reactions.    Family History  Problem Relation Age of Onset  . Sickle cell anemia Mother   . Sickle cell anemia Father   . Sickle cell trait Brother     Prior to Admission medications    Medication Sig Start Date End Date Taking? Authorizing Provider  folic acid (FOLVITE) 1 MG tablet Take 1 tablet (1 mg total) by mouth every morning. 06/05/12  Yes [name], MD  HYDROmorphone (DILAUDID) 4 MG tablet Take 1 tablet (4 mg total) by mouth every 4 (four) hours as needed for severe pain. 06/27/13  Yes Dorena Dew, FNP  lisinopril (PRINIVIL,ZESTRIL) 10 MG tablet Take 1 tablet (10 mg total) by mouth daily. 06/20/13  Yes Elwyn Reach, MD  metoprolol tartrate (LOPRESSOR) 25 MG tablet Take 1 tablet (25 mg total) by mouth 2 (two) times daily. 06/20/13  Yes Elwyn Reach, MD  morphine (MS CONTIN) 15 MG 12 hr tablet Take 1 tablet (15 mg total) by mouth 2 (two) times daily. 06/27/13  Yes Dorena Dew, FNP  potassium chloride SA (K-DUR,KLOR-CON) 20 MEQ tablet Take 1 tablet (20 mEq total) by mouth every morning. 03/30/13  Yes Elwyn Reach, MD  Rivaroxaban (XARELTO) 20 MG TABS tablet Take 20 mg by mouth every morning.    Yes Historical Provider, MD  zolpidem (AMBIEN) 10 MG tablet Take 1 tablet (10 mg total) by mouth at bedtime as needed for sleep. 06/27/13  Yes Dorena Dew, FNP   Physical Exam: Filed Vitals:   07/29/13 1040 07/29/13 1203 07/29/13 1327 07/29/13 1529  BP: 123/76 125/83 127/83 120/81  Pulse: 85 71 80 73  Temp: 98.6 F (37 C) 98.3 F (36.8 C)    TempSrc: Oral Oral Other (Comment)   Resp: 24 22 22 20   SpO2: 95% 96% 95% 97%   General appearance: alert, cooperative, icteric and mild distress  Head: Normocephalic, without obvious abnormality, atraumatic  Eyes: conjunctivae/corneas clear. PERRL, EOM's intact. Fundi benign. Scleral icterus Ears: normal TM's and external ear canals both ears  Nose: Nares normal. Septum midline. Mucosa normal. No drainage or sinus tenderness.  Throat: lips, mucosa, and tongue normal; teeth and gums normal  Neck: no adenopathy, no carotid bruit, no JVD, supple, symmetrical, trachea midline and thyroid not enlarged,  symmetric, no tenderness/mass/nodules  Back: no tenderness to percussion or palpation, symmetric, no curvature. ROM normal. No CVA tenderness.  Lungs: clear to auscultation bilaterally  Chest wall: no tenderness  Heart: regularly irregular rhythm  Abdomen: soft, non-tender; bowel sounds normal; no masses, no organomegaly  Extremities: extremities normal, atraumatic, no cyanosis or edema  Pulses: 2+ and symmetric  Skin: Skin color, texture, turgor normal. No rashes or lesions  Lymph nodes: Cervical, supraclavicular, and axillary nodes normal.  Neurologic: Alert and oriented X 3, normal strength and tone. Normal symmetric reflexes. Normal coordination and gait   Labs on Admission:   Basic Metabolic Panel:  Recent Labs Lab 07/23/13 0916 07/27/13 1435 07/29/13 1138  NA 137 141 139  K 3.7 3.6* 3.5*  CL 103 105 104  CO2 22 21 22   GLUCOSE 105* 103* 98  BUN 8 5*  13  CREATININE 0.56 0.54 0.56  CALCIUM 8.5 8.9 9.0   Liver Function Tests:  Recent Labs Lab 07/23/13 0916 07/27/13 1435  AST 36 33  ALT 26 21  ALKPHOS 91 95  BILITOT 4.4* 5.5*  PROT 7.3 8.2  ALBUMIN 3.8 4.3   CBC:  Recent Labs Lab 07/23/13 0916 07/27/13 1435 07/29/13 1138  WBC 16.2* 16.8* 20.2*  NEUTROABS 12.4* 12.3* 14.7*  HGB 8.2* 8.6* 8.5*  HCT 24.0* 24.7* 24.2*  MCV 92.3 91.1 91.0  PLT 349 425* 438*   BNP: No components found with this basename: POCBNP,    Radiological Exams on Admission: Dg Chest 2 View  07/29/2013   CLINICAL DATA:  Sickle cell crisis with chest pain and fever  EXAM: CHEST  2 VIEW  COMPARISON:  PA and lateral chest of Jun 26, 2013  FINDINGS: The lungs remain well-expanded. The interstitial markings are mildly increased bilaterally but less conspicuous than on the earlier study. There is no alveolar pneumonia. The cardiac silhouette remains enlarged. The central pulmonary vascularity is prominent but stable. The Port-A-Cath appliance tip lies in the region of the mid to distal SVC.  The bony thorax is unremarkable.  IMPRESSION: There is no pneumonia. There is stable enlargement of the cardiac silhouette. Mild prominence of the interstitial markings has improved since the previous study.   Electronically Signed   By: David  Martinique   On: 07/29/2013 12:11    EKG: Independently reviewed.   Assessment/Plan: Active Problems:   Sickle cell crisis 1. Sickle cell anemia, HbSS: Will admit to day infusion center for extended observation. Will start hypotonic IV fluids for cellular rehydration. Will start PCA Dilaudid per weight based protocol. Will re-access pain intensity throughout extended observation.   Time spend: 32 Code Status: Full Family Communication: None Disposition Plan: Home when stable  Dorena Dew, FNP  Pager [phone]  If 7PM-7AM, please contact night-coverage [email or web address] Password [email or web address] 07/29/2013, 4:05 PM

## 2013-07-29 NOTE — ED Notes (Signed)
Provided pt with [place]

## 2013-07-29 NOTE — ED Notes (Signed)
Bed: WA02 Expected date:  Expected time:  Means of arrival:  Comments: 

## 2013-07-29 NOTE — Progress Notes (Signed)
Pt transported to 5th floor, rm 1514 [facility]; report given to Sherlynn Stalls, Therapist, sports; all questions answered; no complications noted

## 2013-07-29 NOTE — Progress Notes (Signed)
Pt transported from ED to [facility] upon opening patient's chart to see orders, pt has orders for admission to inpatient bed at [facility] hospital; notified ED nurse that pt did not have admission orders for [facility]; MD notified

## 2013-07-29 NOTE — ED Notes (Signed)
PA at bedside.

## 2013-07-29 NOTE — ED Provider Notes (Signed)
CSN: [id number]     Arrival date & time 07/29/13  1029 History   First MD Initiated Contact with Patient 07/29/13 1110     Chief Complaint  Patient presents with  . Sickle Cell Pain Crisis     (Consider location/radiation/quality/duration/timing/severity/associated sxs/prior Treatment) HPI  Patient presents to the emergency departments for sickle cell crisis. He was seen here on Saturday for the same, his pain was treated and he was sent home. At that time he is not having any back pain or chest pain on note review. Today he is complaining of rib pain, chest pain and back pain. He reports that when he left he was still having significant pain. He reports taking his MS Contin, throughout, lisinopril, Dilaudid at home without any relief. In triage his vital signs are stable and within normal limits. The patient appears uncomfortable. Not having any shortness of breath, headache, fevers, neck pain.  Past Medical History  Diagnosis Date  . Sickle cell anemia   . Blood transfusion   . 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 Hypokalemia   . Mood disorder   . History of pulmonary embolus (PE)   . Avascular necrosis   . Leukocytosis     Chronic  . Thrombocytosis     Chronic  . Hypertension   . History of Clostridium difficile infection   . Uses marijuana   . Chronic anticoagulation   . Functional asplenia   . Former smoker   . Second hand tobacco smoke exposure   . Alcohol consumption of one to four drinks per day    Past Surgical History  Procedure Laterality Date  . Right hip replacement      08/2006  . Cholecystectomy      01/2008  . Porta cath placement    . Porta cath removal    . Umbilical hernia repair      01/2008  . Excision of left periauricular cyst      10/2009  . Excision of right ear lobe cyst with primary closur      11/2007  . Portacath placement  01/05/2012    Procedure: INSERTION PORT-A-CATH;  Surgeon: Odis Hollingshead, MD;   Location: [place];  Service: General;  Laterality: N/A;  ultrasound guiced port a cath insertion with fluoroscopy   Family History  Problem Relation Age of Onset  . Sickle cell anemia Mother   . Sickle cell anemia Father   . Sickle cell trait Brother    History  Substance Use Topics  . Smoking status: Former Smoker -- 13 years    Types: Cigarettes    Quit date: 07/08/2010  . Smokeless tobacco: Never Used  . Alcohol Use: 3.6 oz/week    6 Shots of liquor per week    Review of Systems   Review of Systems  Gen: no weight loss, fevers, chills, night sweats  Eyes: no discharge or drainage, no occular pain or visual changes  Nose: no epistaxis or rhinorrhea  Mouth: no dental pain, no sore throat  Neck: no neck pain  Lungs:No wheezing, coughing or hemoptysis CV: + chest pain, No palpitations, dependent edema or orthopnea  Abd: no abdominal pain, nausea, vomiting, diarrhea GU: no dysuria or gross hematuria  MSK:  No muscle weakness + back pain Neuro: no headache, no focal neurologic deficits  Skin: no rash or wounds Psyche: no complaints    Allergies  Morphine and related  Home Medications   Prior to Admission  medications   Medication Sig Start Date End Date Taking? Authorizing Provider  folic acid (FOLVITE) 1 MG tablet Take 1 tablet (1 mg total) by mouth every morning. 06/05/12  Yes [name], MD  HYDROmorphone (DILAUDID) 4 MG tablet Take 1 tablet (4 mg total) by mouth every 4 (four) hours as needed for severe pain. 06/27/13  Yes Dorena Dew, FNP  lisinopril (PRINIVIL,ZESTRIL) 10 MG tablet Take 1 tablet (10 mg total) by mouth daily. 06/20/13  Yes Elwyn Reach, MD  metoprolol tartrate (LOPRESSOR) 25 MG tablet Take 1 tablet (25 mg total) by mouth 2 (two) times daily. 06/20/13  Yes Elwyn Reach, MD  morphine (MS CONTIN) 15 MG 12 hr tablet Take 1 tablet (15 mg total) by mouth 2 (two) times daily. 06/27/13  Yes Dorena Dew, FNP  potassium chloride SA  (K-DUR,KLOR-CON) 20 MEQ tablet Take 1 tablet (20 mEq total) by mouth every morning. 03/30/13  Yes Elwyn Reach, MD  Rivaroxaban (XARELTO) 20 MG TABS tablet Take 20 mg by mouth every morning.    Yes Historical Provider, MD  zolpidem (AMBIEN) 10 MG tablet Take 1 tablet (10 mg total) by mouth at bedtime as needed for sleep. 06/27/13  Yes Dorena Dew, FNP   BP 127/83  Pulse 80  Temp(Src) 98.3 F (36.8 C) (Oral)  Resp 22  SpO2 95% Physical Exam  Nursing note and vitals reviewed. Constitutional: He appears well-developed and well-nourished. No distress.  HENT:  Head: Normocephalic and atraumatic.  Eyes: Pupils are equal, round, and reactive to light. Scleral icterus is present.  Neck: Normal range of motion. Neck supple.  Cardiovascular: Normal rate and regular rhythm.   Pulmonary/Chest: Effort normal.  Abdominal: Soft.  Neurological: He is alert.  Skin: Skin is warm and dry.    ED Course  Procedures (including critical care time) Labs Review Labs Reviewed  RETICULOCYTES - Abnormal; Notable for the following:    Retic Ct Pct 15.0 (*)    RBC. 2.66 (*)    Retic Count, Manual 399.0 (*)    All other components within normal limits  CBC WITH DIFFERENTIAL - Abnormal; Notable for the following:    WBC 20.2 (*)    RBC 2.66 (*)    Hemoglobin 8.5 (*)    HCT 24.2 (*)    RDW 18.8 (*)    Platelets 438 (*)    Neutro Abs 14.7 (*)    Monocytes Relative 14 (*)    Monocytes Absolute 2.7 (*)    All other components within normal limits  BASIC METABOLIC PANEL - Abnormal; Notable for the following:    Potassium 3.5 (*)    All other components within normal limits    Imaging Review Dg Chest 2 View  07/29/2013   CLINICAL DATA:  Sickle cell crisis with chest pain and fever  EXAM: CHEST  2 VIEW  COMPARISON:  PA and lateral chest of Jun 26, 2013  FINDINGS: The lungs remain well-expanded. The interstitial markings are mildly increased bilaterally but less conspicuous than on the earlier  study. There is no alveolar pneumonia. The cardiac silhouette remains enlarged. The central pulmonary vascularity is prominent but stable. The Port-A-Cath appliance tip lies in the region of the mid to distal SVC. The bony thorax is unremarkable.  IMPRESSION: There is no pneumonia. There is stable enlargement of the cardiac silhouette. Mild prominence of the interstitial markings has improved since the previous study.   Electronically Signed   By: David  Martinique  On: 07/29/2013 12:11     EKG Interpretation   Date/Time:  Monday July 29 2013 10:39:32 EDT Ventricular Rate:  86 PR Interval:  167 QRS Duration: 108 QT Interval:  404 QTC Calculation: 483 R Axis:   -73 Text Interpretation:  Sinus rhythm Left atrial enlargement Left anterior  fascicular block RSR' in V1 or V2, probably normal variant Abnormal T,  probable ischemia, anterior leads Compared to previous tracing RSR prime  NOW PRESENT Confirmed by [facility]  MD, Jenny Reichmann ([id number]) on 07/29/2013 10:55:56 AM      MDM   Final diagnoses:  Sickle cell pain crisis    Medications  sodium chloride 0.9 % bolus 1,000 mL (0 mLs Intravenous Stopped 07/29/13 1233)  promethazine (PHENERGAN) injection 12.5 mg (12.5 mg Intravenous Given 07/29/13 1137)  HYDROmorphone (DILAUDID) injection 2 mg (2 mg Intravenous Given 07/29/13 1228)  diphenhydrAMINE (BENADRYL) injection 25 mg (25 mg Intravenous Given 07/29/13 1229)  HYDROmorphone (DILAUDID) injection 1 mg (1 mg Intravenous Given 07/29/13 1320)  promethazine (PHENERGAN) injection 12.5 mg (12.5 mg Intravenous Given 07/29/13 1326)  HYDROmorphone (DILAUDID) injection 1 mg (1 mg Intravenous Given 07/29/13 1403)    Patient hemoglobin is at baseline, he does have a baseline hemoglobin and his chest xray is reassuring. Will attempt to control his pain, if unable to control he will need to be admitted to the sickle cell clinic.   2:16 pm Unable to control sickle cell pain. Patient to be discharged to sickle cell  clinic. He is on Xarelto, no SOB, clear chest sounds. Normal BP. Says this is like his typical pain crisis and he prefers to stay than get discharged. Dr. Rosendo Gros has agreed to admit. Due to time of day patient will go as inpatient as opposed to the sickle cell clinic.  Inpatient, [facility], Team 8, [facility], medsurg  Linus Mako, PA-C 07/29/13 1417  Linus Mako, PA-C 07/29/13 1517

## 2013-07-29 NOTE — Discharge Summary (Signed)
Physician Discharge Summary  [name] KVQ:[id number] DOB: Jun 05, 1980 DOA: 07/29/2013  PCP: [name],[name], MD  Admit date: 07/29/2013 Discharge date: 07/29/2013  Discharge Diagnoses:  Active Problems:   Sickle cell crisis   Discharge Condition: Stable  Disposition:   Diet:Regular Wt Readings from Last 3 Encounters:  07/27/13 170 lb (77.111 kg)  07/23/13 168 lb (76.204 kg)  07/10/13 168 lb (76.204 kg)     Hospital Course:  Patient was transitioned to the day infusion center from the [facility] ED. Patient was given IV Dilaudid while in the emergency department. Pain intensity was 7/10 upon arrival. Patient was started on Dilaudid PCA per weight based protocol and D5.45 for cellular rehydration. Patient used a total of  6.29 mg with 9 demands and 9 deliveries. Patient reports that pain intensity remained 7/10. Patient feels that he cannot manage at home on current medication regimen. Patient will require a higher level of care. Will admit patient to inpatient unit for a higher level of care.   Discharge Exam:  Filed Vitals:   07/29/13 1719  BP: 131/90  Pulse: 93  Temp:   Resp: 27   Filed Vitals:   07/29/13 1327 07/29/13 1529 07/29/13 1619 07/29/13 1719  BP: 127/83 120/81  131/90  Pulse: 80 73  93  Temp:      TempSrc: Other (Comment)     Resp: 22 20  27   SpO2: 95% 97% 98% 96%   BP 131/90  Pulse 93  Temp(Src) 98.3 F (36.8 C) (Oral)  Resp 27  SpO2 96% General appearance: alert, cooperative and mild distress Head: Normocephalic, without obvious abnormality, atraumatic Back: symmetric, no curvature. ROM normal. No CVA tenderness. Lungs: clear to auscultation bilaterally Chest wall: no tenderness, Cannot reproduce tenderness on palpation Pulses: 2+ and symmetric Lymph nodes: Cervical, supraclavicular, and axillary nodes normal. Musculoskeletal: No warm swelling or erythema around joints, no spinal tenderness noted.   Discharge Instructions     Medication  List    ASK your doctor about these medications       folic acid 1 MG tablet  Commonly known as:  FOLVITE  Take 1 tablet (1 mg total) by mouth every morning.     HYDROmorphone 4 MG tablet  Commonly known as:  DILAUDID  Take 1 tablet (4 mg total) by mouth every 4 (four) hours as needed for severe pain.     lisinopril 10 MG tablet  Commonly known as:  PRINIVIL,ZESTRIL  Take 1 tablet (10 mg total) by mouth daily.     metoprolol tartrate 25 MG tablet  Commonly known as:  LOPRESSOR  Take 1 tablet (25 mg total) by mouth 2 (two) times daily.     morphine 15 MG 12 hr tablet  Commonly known as:  MS CONTIN  Take 1 tablet (15 mg total) by mouth 2 (two) times daily.     potassium chloride SA 20 MEQ tablet  Commonly known as:  K-DUR,KLOR-CON  Take 1 tablet (20 mEq total) by mouth every morning.     XARELTO 20 MG Tabs tablet  Generic drug:  rivaroxaban  Take 20 mg by mouth every morning.     zolpidem 10 MG tablet  Commonly known as:  AMBIEN  Take 1 tablet (10 mg total) by mouth at bedtime as needed for sleep.          The results of significant diagnostics from this hospitalization (including imaging, microbiology, ancillary and laboratory) are listed below for reference.    Significant Diagnostic Studies:  Dg Chest 2 View  07/29/2013   CLINICAL DATA:  Sickle cell crisis with chest pain and fever  EXAM: CHEST  2 VIEW  COMPARISON:  PA and lateral chest of Jun 26, 2013  FINDINGS: The lungs remain well-expanded. The interstitial markings are mildly increased bilaterally but less conspicuous than on the earlier study. There is no alveolar pneumonia. The cardiac silhouette remains enlarged. The central pulmonary vascularity is prominent but stable. The Port-A-Cath appliance tip lies in the region of the mid to distal SVC. The bony thorax is unremarkable.  IMPRESSION: There is no pneumonia. There is stable enlargement of the cardiac silhouette. Mild prominence of the interstitial markings has  improved since the previous study.   Electronically Signed   By: David  Martinique   On: 07/29/2013 12:11    Microbiology: No results found for this or any previous visit (from the past 240 hour(s)).   Labs: Basic Metabolic Panel:  Recent Labs Lab 07/23/13 0916 07/27/13 1435 07/29/13 1138  NA 137 141 139  K 3.7 3.6* 3.5*  CL 103 105 104  CO2 22 21 22   GLUCOSE 105* 103* 98  BUN 8 5* 13  CREATININE 0.56 0.54 0.56  CALCIUM 8.5 8.9 9.0   Liver Function Tests:  Recent Labs Lab 07/23/13 0916 07/27/13 1435  AST 36 33  ALT 26 21  ALKPHOS 91 95  BILITOT 4.4* 5.5*  PROT 7.3 8.2  ALBUMIN 3.8 4.3   No results found for this basename: LIPASE, AMYLASE,  in the last 168 hours No results found for this basename: AMMONIA,  in the last 168 hours CBC:  Recent Labs Lab 07/23/13 0916 07/27/13 1435 07/29/13 1138  WBC 16.2* 16.8* 20.2*  NEUTROABS 12.4* 12.3* 14.7*  HGB 8.2* 8.6* 8.5*  HCT 24.0* 24.7* 24.2*  MCV 92.3 91.1 91.0  PLT 349 425* 438*   Cardiac Enzymes: No results found for this basename: CKTOTAL, CKMB, CKMBINDEX, TROPONINI,  in the last 168 hours BNP: No components found with this basename: POCBNP,  CBG: No results found for this basename: GLUCAP,  in the last 168 hours Ferritin: No results found for this basename: FERRITIN,  in the last 168 hours  Time coordinating discharge: Greater than 30 minutes  Signed:  Charlisha Market M  07/29/2013, 5:39 PM

## 2013-07-29 NOTE — Progress Notes (Signed)
RN, Morey Hummingbird, called report for pt to be transferred to [facility]; report received

## 2013-07-29 NOTE — H&P (Signed)
I agree with the above history, physical, assessment and plan. Pt will be treated in the day hospital. Dispo depends on improvement and ability to manage pain at home.

## 2013-07-30 DIAGNOSIS — D5700 Hb-SS disease with crisis, unspecified: Secondary | ICD-10-CM

## 2013-07-30 LAB — CREATININE, SERUM
CREATININE: 0.62 mg/dL (ref 0.50–1.35)
GFR calc Af Amer: >90 mL/min (ref >90)

## 2013-07-30 LAB — CBC
HCT: 23.5 % — ABNORMAL LOW (ref 39.0–52.0)
HEMOGLOBIN: 8.4 g/dL — AB (ref 13.0–17.0)
MCH: 32.7 pg (ref 26.0–34.0)
MCHC: 35.7 g/dL (ref 30.0–36.0)
MCV: 91.4 fL (ref 78.0–100.0)
PLATELETS: 431 10*3/uL — AB (ref 150–400)
RBC: 2.57 MIL/uL — ABNORMAL LOW (ref 4.22–5.81)
RDW: 19.3 % — ABNORMAL HIGH (ref 11.5–15.5)
WBC: 19.9 10*3/uL — ABNORMAL HIGH (ref 4.0–10.5)

## 2013-07-30 NOTE — Progress Notes (Signed)
24 hour PCA totals: 20.93 mg received 32 demands 30 delivered [name]

## 2013-07-30 NOTE — Progress Notes (Signed)
Subjective: 33 year old gentleman admitted with sickle cell painful crisis. Patient is currently on Dilaudid PCA. He is having 7/10 pain. He is using his PCA as ordered. Has used 38 mg in the last 24 hours was 60 demands and 58 deliveries. He is still feeling bad in general. But mostly angry. He denied shortness of breath, no cough no nausea vomiting or diarrhea.  Objective: Vital signs in last 24 hours: Temp:  [97.5 F (36.4 C)-98.6 F (37 C)] 98.6 F (37 C) (06/30 0947) Pulse Rate:  [71-93] 83 (06/30 0947) Resp:  [18-28] 18 (06/30 1129) BP: (118-131)/(75-90) 118/80 mmHg (06/30 0947) SpO2:  [92 %-98 %] 93 % (06/30 1129) Weight:  [77.111 kg (170 lb)] 77.111 kg (170 lb) (06/29 1823) Weight change:  Last BM Date: 07/29/13  Intake/Output from previous day: 06/29 0701 - 06/30 0700 In: 1312 [I.V.:1312] Out: 900 [Urine:900] Intake/Output this shift: Total I/O In: -  Out: 200 [Urine:200]  General appearance: alert, cooperative and no distress Eyes: conjunctivae/corneas clear. PERRL, EOM's intact. Fundi benign. Neck: no adenopathy, no carotid bruit, no JVD, supple, symmetrical, trachea midline and thyroid not enlarged, symmetric, no tenderness/mass/nodules Back: symmetric, no curvature. ROM normal. No CVA tenderness. Resp: clear to auscultation bilaterally Chest wall: no tenderness Cardio: regular rate and rhythm, S1, S2 normal, no murmur, click, rub or gallop GI: soft, non-tender; bowel sounds normal; no masses,  no organomegaly Extremities: extremities normal, atraumatic, no cyanosis or edema Pulses: 2+ and symmetric Skin: Skin color, texture, turgor normal. No rashes or lesions Neurologic: Grossly normal  Lab Results:  Recent Labs  07/27/13 1435 07/29/13 1138  WBC 16.8* 20.2*  HGB 8.6* 8.5*  HCT 24.7* 24.2*  PLT 425* 438*   BMET  Recent Labs  07/27/13 1435 07/29/13 1138  NA 141 139  K 3.6* 3.5*  CL 105 104  CO2 21 22  GLUCOSE 103* 98  BUN 5* 13  CREATININE  0.54 0.56  CALCIUM 8.9 9.0    Studies/Results: Dg Chest 2 View  07/29/2013   CLINICAL DATA:  Sickle cell crisis with chest pain and fever  EXAM: CHEST  2 VIEW  COMPARISON:  PA and lateral chest of Jun 26, 2013  FINDINGS: The lungs remain well-expanded. The interstitial markings are mildly increased bilaterally but less conspicuous than on the earlier study. There is no alveolar pneumonia. The cardiac silhouette remains enlarged. The central pulmonary vascularity is prominent but stable. The Port-A-Cath appliance tip lies in the region of the mid to distal SVC. The bony thorax is unremarkable.  IMPRESSION: There is no pneumonia. There is stable enlargement of the cardiac silhouette. Mild prominence of the interstitial markings has improved since the previous study.   Electronically Signed   By: David  Martinique   On: 07/29/2013 12:11    Medications: I have reviewed the patient's current medications.  Assessment/Plan: A 33 year old gentleman admitted with sickle cell painful crisis.  #1 sickle cell painful crisis: Patient seems to be improving on current Dilaudid dose but not optimal. Will resume his oral medications and titrate down his PCA. He should walk around today and get more exercise. Of goal is to discharge him home within the next 48 hours.  #2 sickle cell anemia: Hemoglobin is stable at this point. No need for transfusion.  #3 history of PE: Patient currently on Xarelto. Continue current treatment.  #4 depression anxiety: Patient has been counseled. Continue his home medications.  #5 tobacco abuse: Again patient has been counseled. Continue his nicotine patch as needed.  LOS: 1 day   [name],[name] 07/30/2013, 11:55 AM

## 2013-07-31 ENCOUNTER — Telehealth: Payer: Self-pay

## 2013-07-31 ENCOUNTER — Ambulatory Visit: Payer: Self-pay

## 2013-07-31 ENCOUNTER — Telehealth (HOSPITAL_COMMUNITY): Payer: Self-pay | Admitting: Internal Medicine

## 2013-07-31 ENCOUNTER — Other Ambulatory Visit: Payer: Self-pay

## 2013-07-31 DIAGNOSIS — D571 Sickle-cell disease without crisis: Secondary | ICD-10-CM

## 2013-07-31 DIAGNOSIS — D5700 Hb-SS disease with crisis, unspecified: Secondary | ICD-10-CM

## 2013-07-31 LAB — CBC WITH DIFFERENTIAL/PLATELET
Basophils Absolute: 0.2 10*3/uL — ABNORMAL HIGH (ref 0.0–0.1)
Basophils Relative: 1 % (ref 0–1)
Eosinophils Absolute: 0.6 10*3/uL (ref 0.0–0.7)
Eosinophils Relative: 3 % (ref 0–5)
HCT: 23.6 % — ABNORMAL LOW (ref 39.0–52.0)
Hemoglobin: 8.2 g/dL — ABNORMAL LOW (ref 13.0–17.0)
Lymphocytes Relative: 18 % (ref 12–46)
Lymphs Abs: 3.9 10*3/uL (ref 0.7–4.0)
MCH: 31.9 pg (ref 26.0–34.0)
MCHC: 34.7 g/dL (ref 30.0–36.0)
MCV: 91.8 fL (ref 78.0–100.0)
MONO ABS: 3 10*3/uL — AB (ref 0.1–1.0)
Monocytes Relative: 14 % — ABNORMAL HIGH (ref 3–12)
Neutro Abs: 13.9 10*3/uL — ABNORMAL HIGH (ref 1.7–7.7)
Neutrophils Relative %: 64 % (ref 43–77)
PLATELETS: 435 10*3/uL — AB (ref 150–400)
RBC: 2.57 MIL/uL — AB (ref 4.22–5.81)
RDW: 19.7 % — AB (ref 11.5–15.5)
WBC: 21.6 10*3/uL — ABNORMAL HIGH (ref 4.0–10.5)

## 2013-07-31 LAB — COMPREHENSIVE METABOLIC PANEL
ALT: 19 U/L (ref 0–53)
AST: 32 U/L (ref 0–37)
Albumin: 4.3 g/dL (ref 3.5–5.2)
Alkaline Phosphatase: 97 U/L (ref 39–117)
BUN: 9 mg/dL (ref 6–23)
CALCIUM: 9.2 mg/dL (ref 8.4–10.5)
CO2: 21 meq/L (ref 19–32)
CREATININE: 0.63 mg/dL (ref 0.50–1.35)
Chloride: 104 mEq/L (ref 96–112)
GFR calc Af Amer: >90 mL/min (ref >90)
Glucose, Bld: 115 mg/dL — ABNORMAL HIGH (ref 70–99)
Potassium: 4.1 mEq/L (ref 3.7–5.3)
Sodium: 138 mEq/L (ref 137–147)
TOTAL PROTEIN: 7.7 g/dL (ref 6.0–8.3)
Total Bilirubin: 6.2 mg/dL — ABNORMAL HIGH (ref 0.3–1.2)

## 2013-07-31 MED ORDER — HYDROMORPHONE HCL 4 MG PO TABS: 4.0000 mg | ORAL_TABLET | ORAL | Status: DC | PRN

## 2013-07-31 MED ORDER — CELECOXIB 200 MG PO CAPS: 200.0000 mg | ORAL_CAPSULE | Freq: Two times a day (BID) | ORAL | Status: DC

## 2013-07-31 MED ORDER — POTASSIUM CHLORIDE CRYS ER 20 MEQ PO TBCR: 20.0000 meq | EXTENDED_RELEASE_TABLET | Freq: Every morning | ORAL | Status: DC

## 2013-07-31 MED ORDER — RIVAROXABAN 20 MG PO TABS: 20.0000 mg | ORAL_TABLET | Freq: Every morning | ORAL | Status: DC

## 2013-07-31 MED ORDER — MORPHINE SULFATE ER 15 MG PO TBCR: 15.0000 mg | EXTENDED_RELEASE_TABLET | Freq: Two times a day (BID) | ORAL | Status: DC

## 2013-07-31 MED ORDER — HEPARIN SOD (PORK) LOCK FLUSH 100 UNIT/ML IV SOLN
500.0000 [IU] | INTRAVENOUS | Status: AC | PRN
  Administered 2013-07-31: 500 [IU]

## 2013-07-31 NOTE — Discharge Summary (Signed)
Physician Discharge Summary  Patient ID: [name] MRN: [id number] DOB/AGE: November 14, 1980 33 y.o.  Admit date: 07/29/2013 Discharge date: 07/31/2013  Admission Diagnoses:  Discharge Diagnoses:  Active Problems:   Sickle cell crisis   Discharged Condition: good  Hospital Course: Patient was admitted with sickle cell painful crisis. He received IV Dilaudid, IV hydration, as well as Toradol. He was having no significant shortness of breath or cough. His blood pressure was maintained on his home medication. At the time of discharge his pain level has dropped to 3/10. He was having no significant other complaints. Patient was discharged with his prescriptions return to the clinic today. He'll follow in the clinic as scheduled.  Consults: None  Significant Diagnostic Studies: labs: Serial CBCs and CMP checked.  Treatments: IV hydration and analgesia: Dilaudid  Discharge Exam: Blood pressure 116/78, pulse 73, temperature 98.5 F (36.9 C), temperature source Oral, resp. rate 17, height 6' (1.829 m), weight 77.111 kg (170 lb), SpO2 97.00%. General appearance: alert, cooperative and no distress Eyes: conjunctivae/corneas clear. PERRL, EOM's intact. Fundi benign. Neck: no adenopathy, no carotid bruit, no JVD, supple, symmetrical, trachea midline and thyroid not enlarged, symmetric, no tenderness/mass/nodules Back: symmetric, no curvature. ROM normal. No CVA tenderness. Resp: clear to auscultation bilaterally Chest wall: no tenderness Cardio: regular rate and rhythm, S1, S2 normal, no murmur, click, rub or gallop GI: soft, non-tender; bowel sounds normal; no masses,  no organomegaly Extremities: extremities normal, atraumatic, no cyanosis or edema Pulses: 2+ and symmetric Skin: Skin color, texture, turgor normal. No rashes or lesions Neurologic: Grossly normal  Disposition: 01-Home or Self Care     Medication List         folic acid 1 MG tablet  Commonly known as:  FOLVITE   Take 1 tablet (1 mg total) by mouth every morning.     HYDROmorphone 4 MG tablet  Commonly known as:  DILAUDID  Take 1 tablet (4 mg total) by mouth every 4 (four) hours as needed for severe pain.     lisinopril 10 MG tablet  Commonly known as:  PRINIVIL,ZESTRIL  Take 1 tablet (10 mg total) by mouth daily.     metoprolol tartrate 25 MG tablet  Commonly known as:  LOPRESSOR  Take 1 tablet (25 mg total) by mouth 2 (two) times daily.     morphine 15 MG 12 hr tablet  Commonly known as:  MS CONTIN  Take 1 tablet (15 mg total) by mouth 2 (two) times daily.     potassium chloride SA 20 MEQ tablet  Commonly known as:  K-DUR,KLOR-CON  Take 1 tablet (20 mEq total) by mouth every morning.     XARELTO 20 MG Tabs tablet  Generic drug:  rivaroxaban  Take 20 mg by mouth every morning.     zolpidem 10 MG tablet  Commonly known as:  AMBIEN  Take 1 tablet (10 mg total) by mouth at bedtime as needed for sleep.         SignedBarbette Merino 07/31/2013, 12:14 PM

## 2013-07-31 NOTE — Telephone Encounter (Signed)
Re-ordered Dilaudid 4 mg every 4 hours as needed for severe pain # 90 MS Contin 15 mg every 12 hours #60   Reviewed [place] Substance Reporting system prior to reorder   Will issue Rx today per Dr. Jonelle Sidle

## 2013-07-31 NOTE — Progress Notes (Signed)
Patient given discharge instructions, and verbalized an understanding of all discharge instructions.  Patient agrees with discharge plan, and is being discharged in stable medical condition.  Patient left floor without signing discharge paperwork, but this nurse caught up with him, and got him to sign his paperwork.  Durwin Nora RN

## 2013-07-31 NOTE — Progress Notes (Signed)
I was called to deaccess his porta cath for discharged home. While flushing the port with 10 cc NS, around the site began to swell and the pt expressed some discomfort.  I attempted to get a blood return but there was no blood.  The pt states he noticed he did not "feel his pain medication like normal" and complained about soreness around the site.  After my assessment, I determined that the needle may no longer be in the port and that I needed to reaccess the port in order to flush it with Heparin for discharge.  Pt agreed to the reaccess.  The port was reaccessed, good blood return, flushed with 10 cc NS followed by Heparin 500 units. The needle was then removed and a gauze dressing was placed. [employer]

## 2013-07-31 NOTE — Telephone Encounter (Signed)
Prescription refills sent to pharmacy for Xarelto and Potassium

## 2013-07-31 NOTE — Progress Notes (Signed)
24 hour PCA totals: Total drug: 26.43 mg Total demands: 41 Total delivered: [place]

## 2013-08-02 IMAGING — CR DG CHEST 1V PORT
1 series · 1 of 1 positions shown · non-contrast
Comparison: 09/08/2010

CLINICAL DATA: PICC placement. Sickle cell crisis.

PORTABLE CHEST - 1 VIEW

[AP]
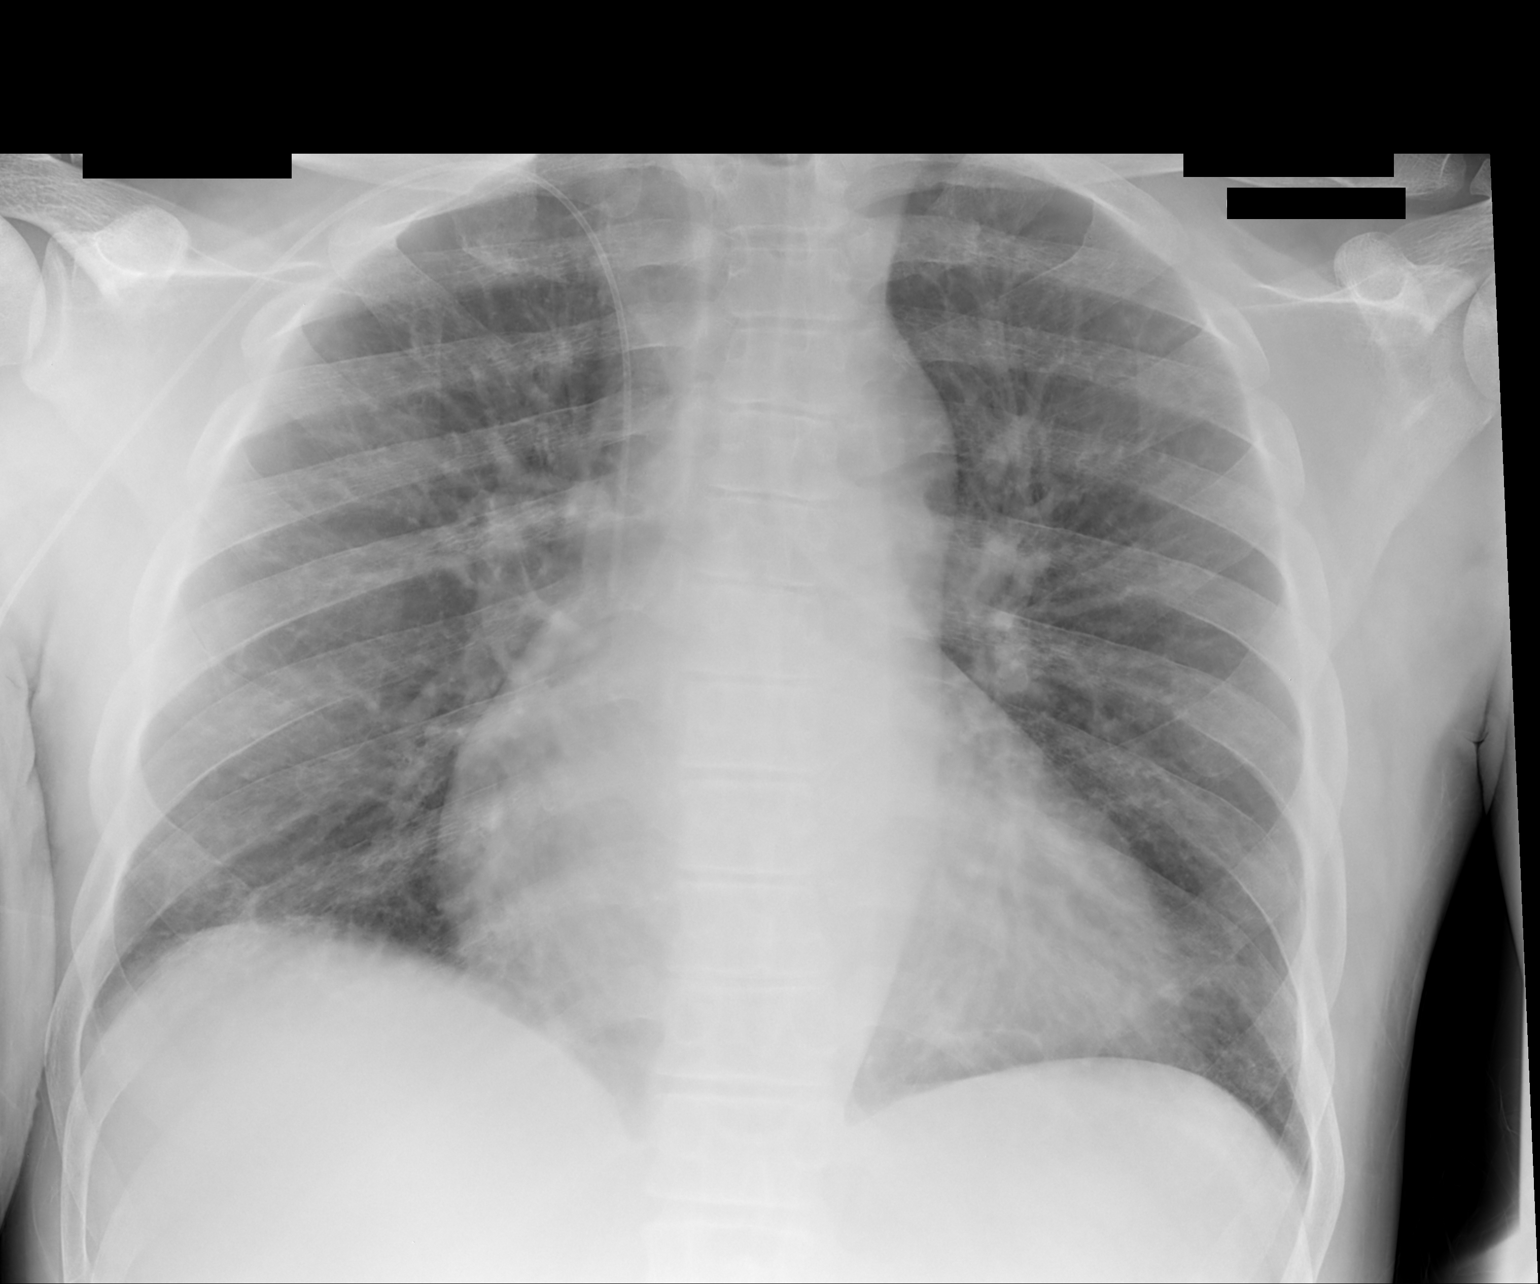

[1 of 1 positions shown; findings below may reference images not displayed]

FINDINGS: Right-sided PICC  has been inserted and the tip is in the
superior vena cava in excellent position.  Mild cardiomegaly.
There is new pulmonary vascular congestion.  No effusions.  No
osseous abnormality.
IMPRESSION: PICC in good position in the superior vena cava.  New pulmonary
vascular congestion.

## 2013-08-06 IMAGING — XA IR US GUIDE VASC ACCESS RIGHT
1 series · 1 of 1 positions shown · non-contrast
Comparison: none

CLINICAL DATA: Sickle cell disease, poor venous access, repeated
venous access needs.

TUNNELED PORT CATHETER PLACEMENT WITH ULTRASOUND AND FLUOROSCOPIC
GUIDANCE
TECHNIQUE: The procedure, risks, benefits, and alternatives were
explained to the patient.  Questions regarding the procedure were
encouraged and answered.  The patient understands and consents to
the procedure.

[Series 300: ir fluoro guide cv line*r* · 1 of 1 slices shown]
[im 1/1]
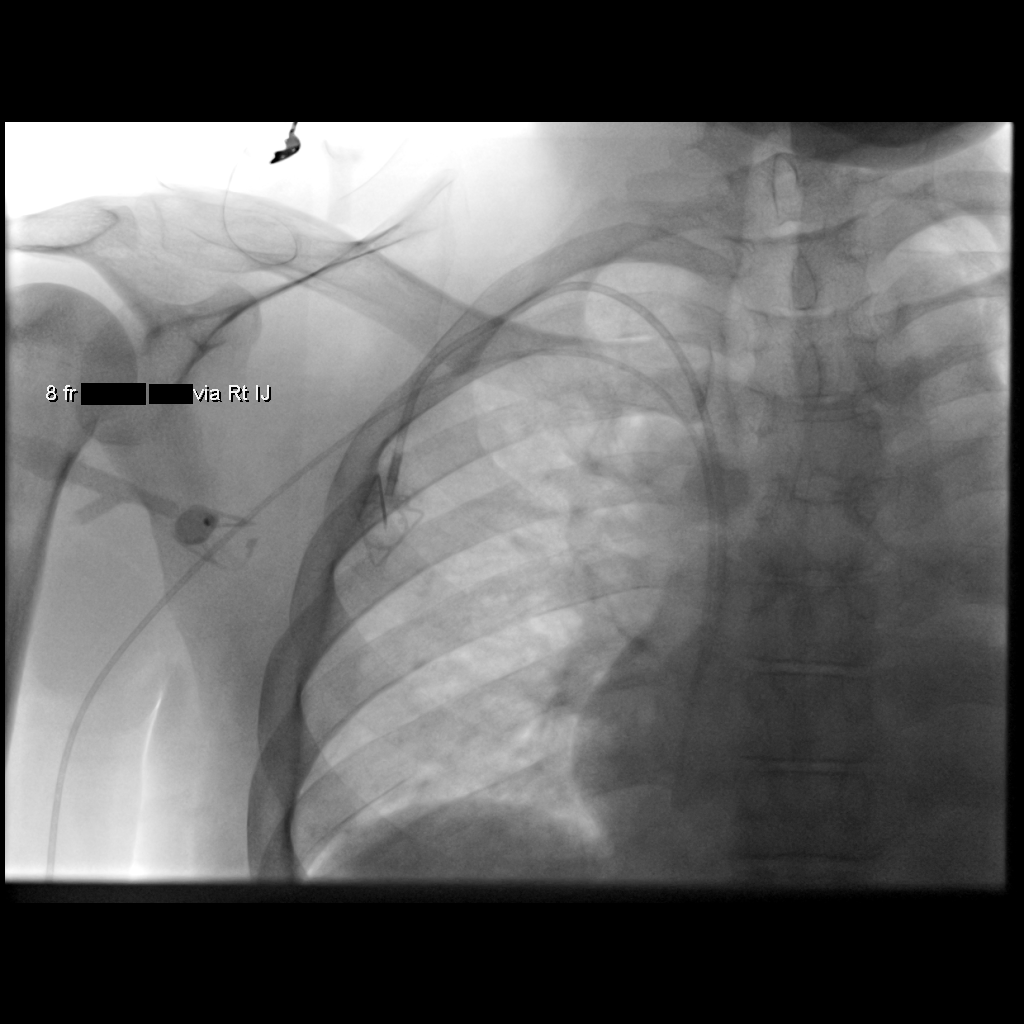

[1 of 1 positions shown; findings below may reference images not displayed]

As antibiotic prophylaxis, cefazolin was ordered pre-procedure and
administered intravenously within 1 hour of incision.

Patency of the right IJ vein was confirmed with ultrasound with
image documentation. An appropriate skin site was determined. Skin
site was marked. Region was prepped using maximum barrier technique
including cap and mask, sterile gown, sterile gloves, large sterile
sheet, and Chlorhexidine   as cutaneous antisepsis.   The region
was infiltrated locally with 1% lidocaine.

Intravenous Fentanyl and Versed were administered as conscious
sedation during continuous cardiorespiratory monitoring by the
radiology RN, with a total moderate sedation time of 20 minutes.

Under real-time ultrasound guidance, the right IJ vein was accessed
with a 21 gauge micropuncture needle; the needle tip within the
vein was confirmed with ultrasound image documentation.   Needle
was exchanged over a 018 guidewire for transitional dilator which
allowed passage of the Benson wire into the IVC. Over this, the
transitional dilator was exchanged for a 5 French MPA catheter. A
small incision was made on the right anterior chest wall and a
subcutaneous pocket fashioned. The power-injectable port was
positioned and its catheter tunneled to the right IJ dermatotomy
site. The MPA catheter was exchanged over an Amplatz wire for a
peel-away sheath, through which the port catheter, which had been
trimmed to the appropriate length, was advanced and positioned
under fluoroscopy with its tip at the cavoatrial junction. Spot
chest radiograph  confirms good catheter position and no
pneumothorax. The pocket was closed with deep interrupted and
subcuticular continuous 3-0 Monocryl sutures. The port was  flushed
per protocol. The incisions were covered with Dermabond then
covered with a sterile dressing. No immediate complication.

IMPRESSION
Technically successful right IJ power-injectable port catheter
placement. Ready for routine use.

## 2013-08-08 NOTE — Discharge Summary (Signed)
Pt seen and examined and discussed with NP Cammie Sickle. Agree with discharge home. Pt advised to follow with his office visits as he has missed several visits that were previously scheduled.

## 2013-08-11 ENCOUNTER — Emergency Department (HOSPITAL_COMMUNITY)
Admission: EM | Admit: 2013-08-11 | Discharge: 2013-08-11 | Disposition: A | Discharge status: Home / Self Care | Payer: Medicare Other | Attending: Emergency Medicine | Admitting: Emergency Medicine

## 2013-08-11 DIAGNOSIS — Z87891 Personal history of nicotine dependence: Secondary | ICD-10-CM | POA: Diagnosis not present

## 2013-08-11 DIAGNOSIS — Z79899 Other long term (current) drug therapy: Secondary | ICD-10-CM | POA: Insufficient documentation

## 2013-08-11 DIAGNOSIS — Z8659 Personal history of other mental and behavioral disorders: Secondary | ICD-10-CM | POA: Diagnosis not present

## 2013-08-11 DIAGNOSIS — E876 Hypokalemia: Secondary | ICD-10-CM | POA: Diagnosis not present

## 2013-08-11 DIAGNOSIS — I10 Essential (primary) hypertension: Secondary | ICD-10-CM | POA: Insufficient documentation

## 2013-08-11 DIAGNOSIS — Z7901 Long term (current) use of anticoagulants: Secondary | ICD-10-CM | POA: Insufficient documentation

## 2013-08-11 DIAGNOSIS — Z8619 Personal history of other infectious and parasitic diseases: Secondary | ICD-10-CM | POA: Diagnosis not present

## 2013-08-11 DIAGNOSIS — Z86711 Personal history of pulmonary embolism: Secondary | ICD-10-CM | POA: Diagnosis not present

## 2013-08-11 DIAGNOSIS — D5700 Hb-SS disease with crisis, unspecified: Secondary | ICD-10-CM | POA: Diagnosis not present

## 2013-08-11 LAB — CBC WITH DIFFERENTIAL/PLATELET
Basophils Absolute: 0.1 10*3/uL (ref 0.0–0.1)
Basophils Relative: 1 % (ref 0–1)
EOS ABS: 0.2 10*3/uL (ref 0.0–0.7)
Eosinophils Relative: 1 % (ref 0–5)
HCT: 24 % — ABNORMAL LOW (ref 39.0–52.0)
HEMOGLOBIN: 8.3 g/dL — AB (ref 13.0–17.0)
LYMPHS ABS: 2.3 10*3/uL (ref 0.7–4.0)
Lymphocytes Relative: 14 % (ref 12–46)
MCH: 31.8 pg (ref 26.0–34.0)
MCHC: 34.6 g/dL (ref 30.0–36.0)
MCV: 92 fL (ref 78.0–100.0)
MONO ABS: 1.7 10*3/uL — AB (ref 0.1–1.0)
MONOS PCT: 10 % (ref 3–12)
NEUTROS PCT: 74 % (ref 43–77)
Neutro Abs: 12.2 10*3/uL — ABNORMAL HIGH (ref 1.7–7.7)
Platelets: 515 10*3/uL — ABNORMAL HIGH (ref 150–400)
RBC: 2.61 MIL/uL — AB (ref 4.22–5.81)
RDW: 20.4 % — ABNORMAL HIGH (ref 11.5–15.5)
WBC: 16.5 10*3/uL — ABNORMAL HIGH (ref 4.0–10.5)

## 2013-08-11 LAB — COMPREHENSIVE METABOLIC PANEL
ALT: 17 U/L (ref 0–53)
AST: 27 U/L (ref 0–37)
Albumin: 3.8 g/dL (ref 3.5–5.2)
Alkaline Phosphatase: 89 U/L (ref 39–117)
Anion gap: 14 (ref 5–15)
BUN: 7 mg/dL (ref 6–23)
CO2: 21 mEq/L (ref 19–32)
CREATININE: 0.54 mg/dL (ref 0.50–1.35)
Calcium: 8.9 mg/dL (ref 8.4–10.5)
Chloride: 105 mEq/L (ref 96–112)
GFR calc non Af Amer: >90 mL/min (ref >90)
GLUCOSE: 78 mg/dL (ref 70–99)
POTASSIUM: 3.6 meq/L — AB (ref 3.7–5.3)
Sodium: 140 mEq/L (ref 137–147)
Total Bilirubin: 5.4 mg/dL — ABNORMAL HIGH (ref 0.3–1.2)
Total Protein: 7.3 g/dL (ref 6.0–8.3)

## 2013-08-11 LAB — RETICULOCYTES
RBC.: 2.61 MIL/uL — ABNORMAL LOW (ref 4.22–5.81)
RETIC CT PCT: 20.7 % — AB (ref 0.4–3.1)
Retic Count, Absolute: 540.3 10*3/uL — ABNORMAL HIGH (ref 19.0–186.0)

## 2013-08-11 MED ORDER — SODIUM CHLORIDE 0.9 % IV BOLUS (SEPSIS)
1000.0000 mL | Freq: Once | INTRAVENOUS | Status: AC
  Administered 2013-08-11: 1000 mL via INTRAVENOUS

## 2013-08-11 MED ORDER — DIPHENHYDRAMINE HCL 50 MG/ML IJ SOLN
25.0000 mg | Freq: Once | INTRAMUSCULAR | Status: AC
  Administered 2013-08-11: 25 mg via INTRAVENOUS
  Filled 2013-08-11: qty 1

## 2013-08-11 MED ORDER — HEPARIN SOD (PORK) LOCK FLUSH 100 UNIT/ML IV SOLN
500.0000 [IU] | Freq: Once | INTRAVENOUS | Status: AC
  Administered 2013-08-11: 500 [IU]
  Filled 2013-08-11: qty 5

## 2013-08-11 MED ORDER — SODIUM CHLORIDE 0.9 % IJ SOLN
INTRAMUSCULAR | Status: AC
  Filled 2013-08-11: qty 10

## 2013-08-11 MED ORDER — HYDROMORPHONE HCL PF 2 MG/ML IJ SOLN
2.0000 mg | INTRAMUSCULAR | Status: DC | PRN
  Administered 2013-08-11 (×3): 2 mg via INTRAVENOUS
  Filled 2013-08-11 (×4): qty 1

## 2013-08-11 NOTE — ED Notes (Signed)
Pt left without discharge instructions or signing for discharge.

## 2013-08-11 NOTE — ED Notes (Signed)
Pt reports sickle cell pain crisis that started at 0600. Back pain 9/10. Denies n/v/d. Denies SOB

## 2013-08-11 NOTE — ED Notes (Signed)
Pt placed in hall bed d/t no availability of rooms. Pt has port and cannot be assessed in the hall. Pt made aware of this

## 2013-08-11 NOTE — ED Notes (Signed)
Assumed care of patient from Acey Lav, RN.  Pt c/o pain in mid back.  Pt denies SOB, N/V.

## 2013-08-11 NOTE — ED Notes (Signed)
Bed: [facility] Expected date:  Expected time:  Means of arrival:  Comments: 

## 2013-08-11 NOTE — ED Provider Notes (Signed)
CSN: [id number]     Arrival date & time 08/11/13  2355 History   First MD Initiated Contact with Patient 08/11/13 1042     Chief Complaint  Patient presents with  . Sickle Cell Pain Crisis     (Consider location/radiation/quality/duration/timing/severity/associated sxs/prior Treatment) HPI  33 year old male with history of sickle cell anemia, PE currently on Xarelto, alcohol abuse who is well-known to our ER presents for evaluation of sickle cell related pain. Patient reported woke up this morning complaining of sharp pain to his low back and bilateral shoulder. Pain is 10 out of 10, nonradiating, similar to prior sickle cell related pain. No associated fever, chills, or rash. Denies any specific trigger factor. He tries taking his home medication without any relief. He denies having any chest pain or shortness of breath, nonproductive cough. No other complaint. Last flare was 2 weeks ago in which he was hospitalized for 2 days.  Past Medical History  Diagnosis Date  . Sickle cell anemia   . Blood transfusion   . 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 Hypokalemia   . Mood disorder   . History of pulmonary embolus (PE)   . Avascular necrosis   . Leukocytosis     Chronic  . Thrombocytosis     Chronic  . Hypertension   . History of Clostridium difficile infection   . Uses marijuana   . Chronic anticoagulation   . Functional asplenia   . Former smoker   . Second hand tobacco smoke exposure   . Alcohol consumption of one to four drinks per day    Past Surgical History  Procedure Laterality Date  . Right hip replacement      08/2006  . Cholecystectomy      01/2008  . Porta cath placement    . Porta cath removal    . Umbilical hernia repair      01/2008  . Excision of left periauricular cyst      10/2009  . Excision of right ear lobe cyst with primary closur      11/2007  . Portacath placement  01/05/2012    Procedure: INSERTION PORT-A-CATH;   Surgeon: Odis Hollingshead, MD;  Location: [place];  Service: General;  Laterality: N/A;  ultrasound guiced port a cath insertion with fluoroscopy   Family History  Problem Relation Age of Onset  . Sickle cell anemia Mother   . Sickle cell anemia Father   . Sickle cell trait Brother    History  Substance Use Topics  . Smoking status: Former Smoker -- 13 years    Types: Cigarettes    Quit date: 07/08/2010  . Smokeless tobacco: Never Used  . Alcohol Use: 3.6 oz/week    6 Shots of liquor per week    Review of Systems  All other systems reviewed and are negative.     Allergies  Morphine and related  Home Medications   Prior to Admission medications   Medication Sig Start Date End Date Taking? Authorizing Provider  celecoxib (CELEBREX) 200 MG capsule Take 1 capsule (200 mg total) by mouth 2 (two) times daily. 07/31/13  Yes Dorena Dew, FNP  folic acid (FOLVITE) 1 MG tablet Take 1 tablet (1 mg total) by mouth every morning. 06/05/12  Yes [name], MD  HYDROmorphone (DILAUDID) 4 MG tablet Take 1 tablet (4 mg total) by mouth every 4 (four) hours as needed for severe pain. 07/31/13  Yes Dorena Dew,  FNP  lisinopril (PRINIVIL,ZESTRIL) 10 MG tablet Take 1 tablet (10 mg total) by mouth daily. 06/20/13  Yes Elwyn Reach, MD  metoprolol tartrate (LOPRESSOR) 25 MG tablet Take 1 tablet (25 mg total) by mouth 2 (two) times daily. 06/20/13  Yes Elwyn Reach, MD  morphine (MS CONTIN) 15 MG 12 hr tablet Take 1 tablet (15 mg total) by mouth 2 (two) times daily. 07/31/13  Yes Dorena Dew, FNP  potassium chloride SA (K-DUR,KLOR-CON) 20 MEQ tablet Take 1 tablet (20 mEq total) by mouth every morning. 07/31/13  Yes Dorena Dew, FNP  rivaroxaban (XARELTO) 20 MG TABS tablet Take 1 tablet (20 mg total) by mouth every morning. 07/31/13  Yes Dorena Dew, FNP  zolpidem (AMBIEN) 10 MG tablet Take 1 tablet (10 mg total) by mouth at bedtime as needed for sleep. 06/27/13  Yes Dorena Dew, FNP   BP 128/81  Pulse 97  Temp(Src) 98.7 F (37.1 C) (Oral)  Resp 16  SpO2 94% Physical Exam  Constitutional: He appears well-developed and well-nourished. No distress.  HENT:  Head: Atraumatic.  Eyes: Conjunctivae are normal.  Neck: Normal range of motion. Neck supple.  Cardiovascular: Normal rate and regular rhythm.   Pulmonary/Chest: Effort normal and breath sounds normal.  Musculoskeletal: He exhibits tenderness (Generalized tenderness to paralumbar region and bilateral shoulder without focal point tenderness. Normal skin color, no signs of injury or infection.).  Neurological: He is alert.  Skin: No rash noted.  Psychiatric: He has a normal mood and affect.    ED Course  Procedures (including critical care time)  11:01 AM Patient here with his typical sickle cell related pain not relieved with MS Contin and dilaudid at home. Is afebrile stable normal vital sign. No chest pain, productive cough, or hypoxia to suggest pneumonia or acute chest. We'll focus on treating symptoms.  3:02 PM 'pt felt better after receiving treatment and felt stable for discharge.  He agrees to f/u with PCP for further care.  Has pain medication at home. Return precaution discussed.    Labs Review Labs Reviewed  CBC WITH DIFFERENTIAL - Abnormal; Notable for the following:    WBC 16.5 (*)    RBC 2.61 (*)    Hemoglobin 8.3 (*)    HCT 24.0 (*)    RDW 20.4 (*)    Platelets 515 (*)    Neutro Abs 12.2 (*)    Monocytes Absolute 1.7 (*)    All other components within normal limits  COMPREHENSIVE METABOLIC PANEL - Abnormal; Notable for the following:    Potassium 3.6 (*)    Total Bilirubin 5.4 (*)    All other components within normal limits  RETICULOCYTES - Abnormal; Notable for the following:    Retic Ct Pct 20.7 (*)    RBC. 2.61 (*)    Retic Count, Manual 540.3 (*)    All other components within normal limits    Imaging Review No results found.   EKG Interpretation None       MDM   Final diagnoses:  Sickle cell anemia with pain    BP 117/78  Pulse 80  Temp(Src) 98.7 F (37.1 C) (Oral)  Resp 16  SpO2 93%  I have reviewed nursing notes and vital signs. I reviewed available ER/hospitalization records thought the EMR    Domenic Moras, [place] 08/11/13 1503

## 2013-08-11 NOTE — ED Provider Notes (Signed)
Medical screening examination/treatment/procedure(s) were performed by non-physician practitioner and as supervising physician I was immediately available for consultation/collaboration.   EKG Interpretation None        Osvaldo Shipper, MD 08/11/13 1529

## 2013-08-11 NOTE — Discharge Instructions (Signed)
Sickle Cell Disease Sickle cell disease is also known as sickle cell anemia. This condition affects red blood cells (RBC's). Sickle cell disease is a genetically inherited disorder. RBC's are important in the body, because they contain hemoglobin. Hemoglobin carries oxygen to all of the tissues in the body. People who suffer from sickle cell disease have abnormally shaped hemoglobin molecules that look like sickles, and they cannot carry oxygen as well as normal hemoglobin molecules. The sickle cells also have a chemical on their surface that causes them to stick to vessel walls, so they have a more difficult time squeezing through small vessels. Sickle cells also have a shorter lifespan compared to normal RBC's. This means that the body's bone marrow, where RBC's are produced, must work harder to try and make RBC's faster than the die. However, for many individuals suffering from sickle cells disease, there bone marrow is not efficient enough. The resulting condition is known as anemia (a low number of RBC's). The severity of sickle cell disease depends on many factors, including, oxygen deprivation, concentration of hemoglobin, and the amount of a protective molecule called hemoglobin F (F for fetal). The people with greater amounts of hemoglobin F are better protected. RISK FACTORS   Family members with sickle cell disease (both parents have to have the abnormal gene).  Illness.  Exertion.  Dehydration.  Family origins in areas with high incidence of malaria ([place], [place], the [place], [place] and [place], the [place], and the [place]).  Physical stress.  High altitude. SYMPTOMS   Fever.  Swelling of the hands and feet.  Enlargement of the belly (heart, liver, and spleen).  Frequent lung infections.  Fatigue.  Irritability.  Yellowing of the skin (jaundice).  Severe bone and joint pain.  Delayed puberty.  Shortness of breath.  Pain in the belly, especially  in the upper right side of the abdomen.  Nausea.  Prolonged, sometimes painful erections (priapism).  Rapid or labored breathing.  Frequent infections. PREVENTION   There are no proven methods for preventing sickle cell crises.  Regularly follow up with your doctor.  Avoid dehydration.  Avoid high-altitude travel, especially rapid increases in altitude.  Avoid stress.  Get plenty of rest.  Stay warm.  Male patients may drink cranberry juice to help prevent urinary tract infections.  Maintain good nutrition by eating green, red, and yellow vegetables; fruits; and juices that are rich in antioxidants and other important nutrients.  Eat fish and soy products that are high in omega-three fatty acids.  Make sure you consume enough folic acid, zinc, vitamin E, vitamin C, and L -glutamine.  Blood transfusions.  Immunizations, particularly against the flu and some bacteria, may reduce the risk of infection and thus sickle crisis. TREATMENT If sickle cell disease causes painful symptoms, then over-the-counter pain medications (ie. acetaminophen or ibuprofen) may be used, but consult your caregiver before use. Sickle cell disease is treated by managing pain and maintaining hydration. For patients with shortness of breath or difficulty breathing, oxygen may be given. Children who are at high-risk for the disease may be give blood transfusions. Other medications exist to help the body produce protective hemoglobin, it is important to discuss these with your caregiver. Document Released: 01/17/2005 Document Revised: 04/11/2011 Document Reviewed: 05/01/2008 [facility] Patient Information 2015 [place], [place]. This information is not intended to replace advice given to you by your health care provider. Make sure you discuss any questions you have with your health care provider.

## 2013-08-11 NOTE — ED Notes (Signed)
Patient has port, difficult stick. RN to access when pt in room.

## 2013-08-13 ENCOUNTER — Non-Acute Institutional Stay (HOSPITAL_COMMUNITY)
Admission: AD | Admit: 2013-08-13 | Discharge: 2013-08-13 | Disposition: A | Discharge status: Home / Self Care | Payer: Medicare Other | Attending: Internal Medicine | Admitting: Internal Medicine

## 2013-08-13 ENCOUNTER — Emergency Department (HOSPITAL_COMMUNITY)
Admission: EM | Admit: 2013-08-13 | Discharge: 2013-08-13 | Disposition: A | Discharge status: Home / Self Care | Payer: Medicare Other | Attending: Emergency Medicine | Admitting: Emergency Medicine

## 2013-08-13 ENCOUNTER — Encounter (HOSPITAL_COMMUNITY): Payer: Self-pay

## 2013-08-13 ENCOUNTER — Emergency Department (HOSPITAL_COMMUNITY): Payer: Medicare Other

## 2013-08-13 ENCOUNTER — Telehealth (HOSPITAL_COMMUNITY): Payer: Self-pay | Admitting: *Deleted

## 2013-08-13 ENCOUNTER — Encounter (HOSPITAL_COMMUNITY): Payer: Self-pay | Admitting: Emergency Medicine

## 2013-08-13 DIAGNOSIS — I10 Essential (primary) hypertension: Secondary | ICD-10-CM | POA: Diagnosis not present

## 2013-08-13 DIAGNOSIS — Z96659 Presence of unspecified artificial knee joint: Secondary | ICD-10-CM | POA: Insufficient documentation

## 2013-08-13 DIAGNOSIS — Z7901 Long term (current) use of anticoagulants: Secondary | ICD-10-CM | POA: Insufficient documentation

## 2013-08-13 DIAGNOSIS — Z79899 Other long term (current) drug therapy: Secondary | ICD-10-CM | POA: Insufficient documentation

## 2013-08-13 DIAGNOSIS — D72829 Elevated white blood cell count, unspecified: Secondary | ICD-10-CM | POA: Insufficient documentation

## 2013-08-13 DIAGNOSIS — D5700 Hb-SS disease with crisis, unspecified: Secondary | ICD-10-CM | POA: Diagnosis not present

## 2013-08-13 DIAGNOSIS — Z8619 Personal history of other infectious and parasitic diseases: Secondary | ICD-10-CM | POA: Insufficient documentation

## 2013-08-13 DIAGNOSIS — Z86711 Personal history of pulmonary embolism: Secondary | ICD-10-CM | POA: Insufficient documentation

## 2013-08-13 DIAGNOSIS — R079 Chest pain, unspecified: Secondary | ICD-10-CM | POA: Diagnosis not present

## 2013-08-13 DIAGNOSIS — F1210 Cannabis abuse, uncomplicated: Secondary | ICD-10-CM | POA: Insufficient documentation

## 2013-08-13 DIAGNOSIS — M79609 Pain in unspecified limb: Secondary | ICD-10-CM | POA: Diagnosis not present

## 2013-08-13 DIAGNOSIS — E876 Hypokalemia: Secondary | ICD-10-CM | POA: Insufficient documentation

## 2013-08-13 DIAGNOSIS — Q8909 Congenital malformations of spleen: Secondary | ICD-10-CM | POA: Insufficient documentation

## 2013-08-13 DIAGNOSIS — D473 Essential (hemorrhagic) thrombocythemia: Secondary | ICD-10-CM | POA: Insufficient documentation

## 2013-08-13 DIAGNOSIS — Z791 Long term (current) use of non-steroidal anti-inflammatories (NSAID): Secondary | ICD-10-CM | POA: Insufficient documentation

## 2013-08-13 DIAGNOSIS — Z87891 Personal history of nicotine dependence: Secondary | ICD-10-CM | POA: Insufficient documentation

## 2013-08-13 LAB — BLOOD GAS, ARTERIAL
Acid-base deficit: 3 mmol/L — ABNORMAL HIGH (ref 0.0–2.0)
Bicarbonate: 20.7 mEq/L (ref 20.0–24.0)
Drawn by: 307971
FIO2: 0.21 %
O2 Saturation: 88.2 %
PATIENT TEMPERATURE: 98.6
PH ART: 7.408 (ref 7.350–7.450)
TCO2: 19.7 mmol/L (ref 0–100)
pCO2 arterial: 33.5 mmHg — ABNORMAL LOW (ref 35.0–45.0)
pO2, Arterial: 69.4 mmHg — ABNORMAL LOW (ref 80.0–100.0)

## 2013-08-13 LAB — COMPREHENSIVE METABOLIC PANEL
ALBUMIN: 4.2 g/dL (ref 3.5–5.2)
ALT: 20 U/L (ref 0–53)
AST: 31 U/L (ref 0–37)
Alkaline Phosphatase: 94 U/L (ref 39–117)
Anion gap: 14 (ref 5–15)
BUN: 5 mg/dL — ABNORMAL LOW (ref 6–23)
CALCIUM: 9.1 mg/dL (ref 8.4–10.5)
CO2: 22 mEq/L (ref 19–32)
CREATININE: 0.54 mg/dL (ref 0.50–1.35)
Chloride: 104 mEq/L (ref 96–112)
GFR calc Af Amer: >90 mL/min (ref >90)
GFR calc non Af Amer: >90 mL/min (ref >90)
Glucose, Bld: 100 mg/dL — ABNORMAL HIGH (ref 70–99)
Potassium: 3.8 mEq/L (ref 3.7–5.3)
Sodium: 140 mEq/L (ref 137–147)
TOTAL PROTEIN: 7.6 g/dL (ref 6.0–8.3)
Total Bilirubin: 7.3 mg/dL — ABNORMAL HIGH (ref 0.3–1.2)

## 2013-08-13 LAB — CBC WITH DIFFERENTIAL/PLATELET
BASOS ABS: 0.1 10*3/uL (ref 0.0–0.1)
BASOS PCT: 1 % (ref 0–1)
EOS ABS: 0.2 10*3/uL (ref 0.0–0.7)
EOS PCT: 1 % (ref 0–5)
HEMATOCRIT: 23.7 % — AB (ref 39.0–52.0)
Hemoglobin: 8.3 g/dL — ABNORMAL LOW (ref 13.0–17.0)
Lymphocytes Relative: 10 % — ABNORMAL LOW (ref 12–46)
Lymphs Abs: 2.3 10*3/uL (ref 0.7–4.0)
MCH: 31.4 pg (ref 26.0–34.0)
MCHC: 35 g/dL (ref 30.0–36.0)
MCV: 89.8 fL (ref 78.0–100.0)
MONO ABS: 2.6 10*3/uL — AB (ref 0.1–1.0)
Monocytes Relative: 12 % (ref 3–12)
Neutro Abs: 16.8 10*3/uL — ABNORMAL HIGH (ref 1.7–7.7)
Neutrophils Relative %: 76 % (ref 43–77)
Platelets: 509 10*3/uL — ABNORMAL HIGH (ref 150–400)
RBC: 2.64 MIL/uL — ABNORMAL LOW (ref 4.22–5.81)
RDW: 20.2 % — AB (ref 11.5–15.5)
WBC: 22 10*3/uL — ABNORMAL HIGH (ref 4.0–10.5)

## 2013-08-13 LAB — RETICULOCYTES
RBC.: 2.64 MIL/uL — ABNORMAL LOW (ref 4.22–5.81)
RETIC COUNT ABSOLUTE: 559.7 10*3/uL — AB (ref 19.0–186.0)
RETIC CT PCT: 21.2 % — AB (ref 0.4–3.1)

## 2013-08-13 MED ORDER — DIPHENHYDRAMINE HCL 50 MG/ML IJ SOLN
25.0000 mg | INTRAMUSCULAR | Status: DC | PRN
  Administered 2013-08-13: 25 mg via INTRAVENOUS
  Filled 2013-08-13: qty 1

## 2013-08-13 MED ORDER — SODIUM CHLORIDE 0.9 % IJ SOLN
INTRAMUSCULAR | Status: AC
  Filled 2013-08-13: qty 10

## 2013-08-13 MED ORDER — ONDANSETRON HCL 4 MG/2ML IJ SOLN: 4.0000 mg | Freq: Four times a day (QID) | INTRAMUSCULAR | Status: DC | PRN

## 2013-08-13 MED ORDER — HYDROMORPHONE HCL PF 2 MG/ML IJ SOLN: 2.0000 mg | Freq: Once | INTRAMUSCULAR | Status: DC

## 2013-08-13 MED ORDER — HYDROMORPHONE HCL PF 1 MG/ML IJ SOLN
2.0000 mg | INTRAMUSCULAR | Status: DC | PRN
  Administered 2013-08-13: 2 mg via INTRAVENOUS
  Administered 2013-08-13: 1 mg via INTRAVENOUS
  Administered 2013-08-13: 2 mg via INTRAVENOUS
  Filled 2013-08-13 (×3): qty 2

## 2013-08-13 MED ORDER — HYDROMORPHONE HCL PF 2 MG/ML IJ SOLN: 2.5000 mg | Freq: Once | INTRAMUSCULAR | Status: DC

## 2013-08-13 MED ORDER — SODIUM CHLORIDE 0.9 % IV BOLUS (SEPSIS)
1000.0000 mL | Freq: Once | INTRAVENOUS | Status: AC
  Administered 2013-08-13: 1000 mL via INTRAVENOUS

## 2013-08-13 MED ORDER — SODIUM CHLORIDE 0.9 % IV BOLUS (SEPSIS): 1000.0000 mL | Freq: Once | INTRAVENOUS | Status: DC

## 2013-08-13 MED ORDER — SODIUM CHLORIDE 0.9 % IJ SOLN
9.0000 mL | INTRAMUSCULAR | Status: DC | PRN
Start: 2013-08-13 — End: 2013-08-13

## 2013-08-13 MED ORDER — HYDROMORPHONE 0.3 MG/ML IV SOLN: INTRAVENOUS | Status: DC

## 2013-08-13 MED ORDER — SODIUM CHLORIDE 0.9 % IV SOLN
INTRAVENOUS | Status: DC
  Administered 2013-08-13: 12:00:00 via INTRAVENOUS

## 2013-08-13 MED ORDER — ONDANSETRON HCL 4 MG/2ML IJ SOLN
4.0000 mg | Freq: Once | INTRAMUSCULAR | Status: AC
  Administered 2013-08-13: 4 mg via INTRAVENOUS
  Filled 2013-08-13: qty 2

## 2013-08-13 MED ORDER — DIPHENHYDRAMINE HCL 50 MG/ML IJ SOLN
12.5000 mg | Freq: Once | INTRAMUSCULAR | Status: DC
Start: 2013-08-13 — End: 2013-08-13

## 2013-08-13 MED ORDER — DIPHENHYDRAMINE HCL 25 MG PO CAPS: 25.0000 mg | ORAL_CAPSULE | Freq: Four times a day (QID) | ORAL | Status: DC | PRN

## 2013-08-13 MED ORDER — NALOXONE HCL 0.4 MG/ML IJ SOLN: 0.4000 mg | INTRAMUSCULAR | Status: DC | PRN

## 2013-08-13 NOTE — ED Notes (Signed)
MD at bedside. Aware of intermittent low 02 sats. Low from 88-93 RA. Pt encouraged to take deep breaths. Pt 02 sats increase to 94-98 %

## 2013-08-13 NOTE — Telephone Encounter (Signed)
Received patient call. Patient c/o bilateral side pain 8-9/10 since Saturday; taking dilaudid for pain management and continues to have pain. Patient denies all other pertinent ROS per [facility] Triage Assessment. Informed patient that this RN will notify provider on call and patient is to receive a return call back with recommendation. Patient acknowledges.

## 2013-08-13 NOTE — Telephone Encounter (Signed)
Instructed patient to come to day hospital. Patient acknowledges.

## 2013-08-13 NOTE — ED Provider Notes (Addendum)
CSN: [id number]     Arrival date & time 08/13/13  0938 History   First MD Initiated Contact with Patient 08/13/13 1022     Chief Complaint  Patient presents with  . Sickle Cell Pain Crisis    low O2 Sat. sent from Sickle cell center     (Consider location/radiation/quality/duration/timing/severity/associated sxs/prior Treatment) Patient is a 33 y.o. male presenting with sickle cell pain. The history is provided by the patient.  Sickle Cell Pain Crisis  patient here complaining of back pain and leg pain and similar to his sickle cell pain crisis. No chest pain chest pressure. Denies and shortness of breath. Pain began 3 days ago when he was seen here and treated and discharged. Went to the sickle cell clinic today and was noted to have a pulse oximetry of 95% on 2 L and sent here for further evaluation. Denies any black or bloody stools. No abdominal discomfort. No urinary symptoms. He has used his home medications without relief.  Past Medical History  Diagnosis Date  . Sickle cell anemia   . Blood transfusion   . 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 Hypokalemia   . Mood disorder   . History of pulmonary embolus (PE)   . Avascular necrosis   . Leukocytosis     Chronic  . Thrombocytosis     Chronic  . Hypertension   . History of Clostridium difficile infection   . Uses marijuana   . Chronic anticoagulation   . Functional asplenia   . Former smoker   . Second hand tobacco smoke exposure   . Alcohol consumption of one to four drinks per day    Past Surgical History  Procedure Laterality Date  . Right hip replacement      08/2006  . Cholecystectomy      01/2008  . Porta cath placement    . Porta cath removal    . Umbilical hernia repair      01/2008  . Excision of left periauricular cyst      10/2009  . Excision of right ear lobe cyst with primary closur      11/2007  . Portacath placement  01/05/2012    Procedure: INSERTION PORT-A-CATH;   Surgeon: Odis Hollingshead, MD;  Location: [facility];  Service: General;  Laterality: N/A;  ultrasound guiced port a cath insertion with fluoroscopy   Family History  Problem Relation Age of Onset  . Sickle cell anemia Mother   . Sickle cell anemia Father   . Sickle cell trait Brother    History  Substance Use Topics  . Smoking status: Former Smoker -- 13 years    Types: Cigarettes    Quit date: 07/08/2010  . Smokeless tobacco: Never Used  . Alcohol Use: 3.6 oz/week    6 Shots of liquor per week    Review of Systems  All other systems reviewed and are negative.     Allergies  Morphine and related  Home Medications   Prior to Admission medications   Medication Sig Start Date End Date Taking? Authorizing Provider  celecoxib (CELEBREX) 200 MG capsule Take 1 capsule (200 mg total) by mouth 2 (two) times daily. 07/31/13  Yes Dorena Dew, FNP  folic acid (FOLVITE) 1 MG tablet Take 1 tablet (1 mg total) by mouth every morning. 06/05/12  Yes [name], MD  HYDROmorphone (DILAUDID) 4 MG tablet Take 1 tablet (4 mg total) by mouth every 4 (four)  hours as needed for severe pain. 07/31/13  Yes Dorena Dew, FNP  lisinopril (PRINIVIL,ZESTRIL) 10 MG tablet Take 1 tablet (10 mg total) by mouth daily. 06/20/13  Yes Elwyn Reach, MD  metoprolol tartrate (LOPRESSOR) 25 MG tablet Take 25 mg by mouth daily.   Yes Historical Provider, MD  morphine (MS CONTIN) 15 MG 12 hr tablet Take 1 tablet (15 mg total) by mouth 2 (two) times daily. 07/31/13  Yes Dorena Dew, FNP  potassium chloride SA (K-DUR,KLOR-CON) 20 MEQ tablet Take 1 tablet (20 mEq total) by mouth every morning. 07/31/13  Yes Dorena Dew, FNP  rivaroxaban (XARELTO) 20 MG TABS tablet Take 1 tablet (20 mg total) by mouth every morning. 07/31/13  Yes Dorena Dew, FNP  zolpidem (AMBIEN) 10 MG tablet Take 1 tablet (10 mg total) by mouth at bedtime as needed for sleep. 06/27/13  Yes Dorena Dew, FNP   BP 121/78  Pulse  81  Temp(Src) 98.9 F (37.2 C) (Oral)  Resp 19  SpO2 100% Physical Exam  Nursing note and vitals reviewed. Constitutional: He is oriented to person, place, and time. He appears well-developed and well-nourished.  Non-toxic appearance. No distress.  HENT:  Head: Normocephalic and atraumatic.  Eyes: Conjunctivae, EOM and lids are normal. Pupils are equal, round, and reactive to light.  Neck: Normal range of motion. Neck supple. No tracheal deviation present. No mass present.  Cardiovascular: Normal rate, regular rhythm and normal heart sounds.  Exam reveals no gallop.   No murmur heard. Pulmonary/Chest: Effort normal and breath sounds normal. No stridor. No respiratory distress. He has no decreased breath sounds. He has no wheezes. He has no rhonchi. He has no rales.  Abdominal: Soft. Normal appearance and bowel sounds are normal. He exhibits no distension. There is no tenderness. There is no rebound and no CVA tenderness.  Musculoskeletal: Normal range of motion. He exhibits no edema and no tenderness.  Neurological: He is alert and oriented to person, place, and time. He has normal strength. No cranial nerve deficit or sensory deficit. GCS eye subscore is 4. GCS verbal subscore is 5. GCS motor subscore is 6.  Skin: Skin is warm and dry. No abrasion and no rash noted.  Psychiatric: He has a normal mood and affect. His speech is normal and behavior is normal.    ED Course  Procedures (including critical care time) Labs Review Labs Reviewed  CBC WITH DIFFERENTIAL  COMPREHENSIVE METABOLIC PANEL  RETICULOCYTES  BLOOD GAS, ARTERIAL  SAMPLE TO BLOOD BANK    Imaging Review No results found.   EKG Interpretation   Date/Time:  Tuesday August 13 2013 10:00:35 EDT Ventricular Rate:  82 PR Interval:  178 QRS Duration: 109 QT Interval:  395 QTC Calculation: 461 R Axis:   -68 Text Interpretation:  Sinus rhythm Left atrial enlargement Left anterior  fascicular block Probable anterior  infarct, age indeterminate Lateral  leads are also involved Baseline wander in lead(s) V3 No significant  change since last tracing Confirmed by PICKERING  MD, Ovid Curd [phone]) on  08/13/2013 10:06:10 AM      MDM   Final diagnoses:  None    Patient's pulse oximetry noted and he has been hypoventilating. Blood gas noted. No evidence of hypoxia. Pain is control. Will followup in sickle cell clinic. Patient continues to deny chest pain or shortness of breath at time of discharge.    Leota Jacobsen, MD 08/13/13 [place], MD 08/13/13 [phone]

## 2013-08-13 NOTE — ED Notes (Signed)
Moved pt to room , pt refuses to lay in bed would rather sit on side of bed, I told pt that it was for his care and fall prevention. Pt stated " I will not fall man, I am fine like this"

## 2013-08-13 NOTE — ED Notes (Signed)
MD at bedside.  [place] present to evaluate pt. 02 removed for RA value

## 2013-08-13 NOTE — Discharge Instructions (Signed)
Sickle Cell Anemia, Adult Sickle cell anemia is a condition in which red blood cells have an abnormal "sickle" shape. This abnormal shape shortens the cells' life span, which results in a lower than normal concentration of red blood cells in the blood. The sickle shape also causes the cells to clump together and block free blood flow through the blood vessels. As a result, the tissues and organs of the body do not receive enough oxygen. Sickle cell anemia causes organ damage and pain and increases the risk of infection. CAUSES  Sickle cell anemia is a genetic disorder. Those who receive two copies of the gene have the condition, and those who receive one copy have the trait. RISK FACTORS The sickle cell gene is most common in people whose families originated in [place]. Other areas of the globe where sickle cell trait occurs include the [place], [place] and [place], and the [place].  SIGNS AND SYMPTOMS  Pain, especially in the extremities, back, chest, or abdomen (common). The pain may start suddenly or may develop following an illness, especially if there is dehydration. Pain can also occur due to overexertion or exposure to extreme temperature changes.  Frequent severe bacterial infections, especially certain types of pneumonia and meningitis.  Pain and swelling in the hands and feet.  Decreased activity.   Loss of appetite.   Change in behavior.  Headaches.  Seizures.  Shortness of breath or difficulty breathing.  Vision changes.  Skin ulcers. Those with the trait may not have symptoms or they may have mild symptoms.  DIAGNOSIS  Sickle cell anemia is diagnosed with blood tests that demonstrate the genetic trait. It is often diagnosed during the newborn period, due to mandatory testing nationwide. A variety of blood tests, X-rays, CT scans, MRI scans, ultrasounds, and lung function tests may also be done to monitor the condition. TREATMENT    Sickle cell anemia may be treated with:  Medicines. You may be given pain medicines, antibiotic medicines (to treat and prevent infections) or medicines to increase the production of certain types of hemoglobin.  Fluids.  Oxygen.  Blood transfusions. HOME CARE INSTRUCTIONS   Drink enough fluid to keep your urine clear or pale yellow. Increase your fluid intake in hot weather and during exercise.  Do not smoke. Smoking lowers oxygen levels in the blood.   Only take over-the-counter or prescription medicines for pain, fever, or discomfort as directed by your health care provider.  Take antibiotics as directed by your health care provider. Make sure you finish them it even if you start to feel better.   Take supplements as directed by your health care provider.   Consider wearing a medical alert bracelet. This tells anyone caring for you in an emergency of your condition.   When traveling, keep your medical information, health care provider's names, and the medicines you take with you at all times.   If you develop a fever, do not take medicines to reduce the fever right away. This could cover up a problem that is developing. Notify your health care provider.  Keep all follow-up appointments with your health care provider. Sickle cell anemia requires regular medical care. SEEK MEDICAL CARE IF: You have a fever. SEEK IMMEDIATE MEDICAL CARE IF:   You feel dizzy or faint.   You have new abdominal pain, especially on the left side near the stomach area.   You develop a persistent, often uncomfortable and painful penile erection (priapism). If this is not treated  immediately it will lead to impotence.   You have numbness your arms or legs or you have a hard time moving them.   You have a hard time with speech.   You have a fever or persistent symptoms for more than 2-3 days.   You have a fever and your symptoms suddenly get worse.   You have signs or symptoms of  infection. These include:   Chills.   Abnormal tiredness (lethargy).   Irritability.   Poor eating.   Vomiting.   You develop pain that is not helped with medicine.   You develop shortness of breath.  You have pain in your chest.   You are coughing up pus-like or bloody sputum.   You develop a stiff neck.  Your feet or hands swell or have pain.  Your abdomen appears bloated.  You develop joint pain. MAKE SURE YOU:  Understand these instructions.  Will watch your child's condition.  Will get help right away if your child is not doing well or gets worse. Document Released: 04/27/2005 Document Revised: 11/07/2012 Document Reviewed: 08/29/2012 [facility] Patient Information 2015 [place], [place]. This information is not intended to replace advice given to you by your health care provider. Make sure you discuss any questions you have with your health care provider.

## 2013-08-13 NOTE — Progress Notes (Addendum)
Patient resting O2 86% on RA, CO2 28, patient appears agitated, patient reports aching pain to left shoulder and noted holding left anterior shoulder area. MD notified, instructed to send patient to ED for evaluation. Ed Management consultant notified. Patient escorted to ED by RN via wheelchair with O2 @ 2L.

## 2013-08-13 NOTE — ED Notes (Signed)
Patient transported to X-ray 

## 2013-08-13 NOTE — ED Notes (Signed)
Reports generalized pain since Saturday. Sent from Sickle Cell center for low O2 sat- Currently 95 on 2l. Saturation on room air 91%

## 2013-08-22 ENCOUNTER — Ambulatory Visit (INDEPENDENT_AMBULATORY_CARE_PROVIDER_SITE_OTHER): Payer: Medicare Other | Admitting: Internal Medicine

## 2013-08-22 ENCOUNTER — Encounter: Payer: Self-pay | Admitting: Internal Medicine

## 2013-08-22 VITALS — BP 132/93 | HR 82 | Temp 98.2°F | Ht 72.0 in | Wt 166.0 lb

## 2013-08-22 DIAGNOSIS — R0902 Hypoxemia: Secondary | ICD-10-CM

## 2013-08-22 DIAGNOSIS — I10 Essential (primary) hypertension: Secondary | ICD-10-CM

## 2013-08-22 DIAGNOSIS — I248 Other forms of acute ischemic heart disease: Secondary | ICD-10-CM | POA: Diagnosis not present

## 2013-08-22 DIAGNOSIS — Z86711 Personal history of pulmonary embolism: Secondary | ICD-10-CM

## 2013-08-22 DIAGNOSIS — I5030 Unspecified diastolic (congestive) heart failure: Secondary | ICD-10-CM

## 2013-08-22 DIAGNOSIS — I2789 Other specified pulmonary heart diseases: Secondary | ICD-10-CM

## 2013-08-22 DIAGNOSIS — G4700 Insomnia, unspecified: Secondary | ICD-10-CM

## 2013-08-22 DIAGNOSIS — I2489 Other forms of acute ischemic heart disease: Secondary | ICD-10-CM

## 2013-08-22 DIAGNOSIS — I272 Pulmonary hypertension, unspecified: Secondary | ICD-10-CM

## 2013-08-22 DIAGNOSIS — Z7289 Other problems related to lifestyle: Secondary | ICD-10-CM

## 2013-08-22 DIAGNOSIS — Z7901 Long term (current) use of anticoagulants: Secondary | ICD-10-CM

## 2013-08-22 DIAGNOSIS — Z789 Other specified health status: Secondary | ICD-10-CM

## 2013-08-22 DIAGNOSIS — D571 Sickle-cell disease without crisis: Secondary | ICD-10-CM

## 2013-08-22 DIAGNOSIS — D5700 Hb-SS disease with crisis, unspecified: Secondary | ICD-10-CM | POA: Diagnosis not present

## 2013-08-22 DIAGNOSIS — E559 Vitamin D deficiency, unspecified: Secondary | ICD-10-CM

## 2013-08-22 MED ORDER — LISINOPRIL 10 MG PO TABS: 10.0000 mg | ORAL_TABLET | Freq: Every day | ORAL | Status: DC

## 2013-08-22 MED ORDER — HYDROMORPHONE HCL 4 MG PO TABS: 4.0000 mg | ORAL_TABLET | ORAL | Status: DC | PRN

## 2013-08-22 MED ORDER — METOPROLOL TARTRATE 25 MG PO TABS: 25.0000 mg | ORAL_TABLET | Freq: Every day | ORAL | Status: DC

## 2013-08-22 MED ORDER — ZOLPIDEM TARTRATE 10 MG PO TABS: 10.0000 mg | ORAL_TABLET | Freq: Every evening | ORAL | Status: DC | PRN

## 2013-08-22 MED ORDER — RIVAROXABAN 20 MG PO TABS: 20.0000 mg | ORAL_TABLET | Freq: Every morning | ORAL | Status: DC

## 2013-08-22 MED ORDER — FOLIC ACID 1 MG PO TABS: 1.0000 mg | ORAL_TABLET | Freq: Every morning | ORAL | Status: DC

## 2013-08-22 MED ORDER — MORPHINE SULFATE ER 15 MG PO TBCR: 15.0000 mg | EXTENDED_RELEASE_TABLET | Freq: Two times a day (BID) | ORAL | Status: DC

## 2013-08-22 NOTE — Progress Notes (Signed)
Patient ID: [name], male   DOB: October 26, 1980, 33 y.o.   MRN: [id number]    Malvern Kadlec, is a 33 y.o. male  LKG:[id number]  GUY:[id number]  DOB - [birth year]  CC:  Chief Complaint  Patient presents with  . Follow-up       HPI: [name] is a 33 y.o. male here today for follow up care for [facility]. Patient has No headache, No chest pain, No abdominal pain - No Nausea, No new weakness tingling or numbness, No Cough - SOB.  Allergies  Allergen Reactions  . Morphine And Related Hives and Rash    Pt states he can take PO, but not IV and he is able to tolerate Dilaudid with no reactions.   Past Medical History  Diagnosis Date  . Sickle cell anemia   . Blood transfusion   . 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 Hypokalemia   . Mood disorder   . History of pulmonary embolus (PE)   . Avascular necrosis   . Leukocytosis     Chronic  . Thrombocytosis     Chronic  . Hypertension   . History of Clostridium difficile infection   . Uses marijuana   . Chronic anticoagulation   . Functional asplenia   . Former smoker   . Second hand tobacco smoke exposure   . Alcohol consumption of one to four drinks per day    No current facility-administered medications on file prior to visit.   Current Outpatient Prescriptions on File Prior to Visit  Medication Sig Dispense Refill  . celecoxib (CELEBREX) 200 MG capsule Take 1 capsule (200 mg total) by mouth 2 (two) times daily.  60 capsule  1  . potassium chloride SA (K-DUR,KLOR-CON) 20 MEQ tablet Take 1 tablet (20 mEq total) by mouth every morning.  30 tablet  3   Family History  Problem Relation Age of Onset  . Sickle cell anemia Mother   . Sickle cell anemia Father   . Sickle cell trait Brother    History   Social History  . Marital Status: Single    Spouse Name: N/A    Number of Children: 0  . Years of Education: 13   Occupational History  . 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 Smoking status: Former Smoker -- 13 years    Types: Cigarettes    Quit date: 07/08/2010  . Smokeless tobacco: Never Used  . Alcohol Use: 3.6 oz/week    6 Shots of liquor per week  . Drug Use: 2.00 per week    Special: Marijuana  . Sexual Activity: Yes    Partners: Female    Patent examiner Protection: None   Other Topics Concern  . Not on file   Social History Narrative   Lives in an apartment.  Single.  Lives alone but has a girlfriend that helps care for him.  Does not use any assist devices.        [name]:  [phone] Mom, emergency contact    Review of Systems: Constitutional: Negative for fever, chills, diaphoresis, activity change, appetite change and fatigue. HENT: Negative for ear pain, nosebleeds, congestion, facial swelling, rhinorrhea, neck pain, neck stiffness and ear discharge.  Eyes: Negative for pain, discharge, redness, itching and visual disturbance. Respiratory: Negative for cough, choking, chest tightness, shortness of breath, wheezing and stridor.  Cardiovascular: Negative for chest  pain, palpitations and leg swelling. Gastrointestinal: Negative for abdominal distention. Genitourinary: Negative for dysuria, urgency, frequency, hematuria, flank pain, decreased urine volume, difficulty urinating and dyspareunia.  Musculoskeletal: Negative for back pain, joint swelling, arthralgia and gait problem. Neurological: Negative for dizziness, tremors, seizures, syncope, facial asymmetry, speech difficulty, weakness, light-headedness, numbness and headaches.  Hematological: Negative for adenopathy. Does not bruise/bleed easily. Psychiatric/Behavioral: Negative for hallucinations, behavioral problems, confusion, dysphoric mood, decreased concentration and agitation.    Objective:         Filed Vitals:   08/22/13 1142  BP: 132/93  Pulse: 82  Temp: 98.2 F (36.8 C)    Physical Exam: Constitutional:  Patient appears well-developed and well-nourished. No distress. HENT: Normocephalic, atraumatic, External right and left ear normal. Oropharynx is clear and moist.  Eyes: Conjunctivae and EOM are normal. PERRLA, no scleral icterus. Neck: Normal ROM. Neck supple. No JVD. No tracheal deviation. No thyromegaly. CVS: RRR, S1/S2 +, no murmurs, no gallops, no carotid bruit.  Pulmonary: Effort and breath sounds normal, no stridor, rhonchi, wheezes, rales.  Abdominal: Soft. BS +, no distension, tenderness, rebound or guarding.  Musculoskeletal: Normal range of motion. No edema and no tenderness.  Lymphadenopathy: No lymphadenopathy noted, cervical, inguinal or axillary Neuro: Alert. Normal reflexes, muscle tone coordination. No cranial nerve deficit. Skin: Skin is warm and dry. No rash noted. Not diaphoretic. No erythema. No pallor. Psychiatric: Normal mood and affect. Behavior, judgment, thought content normal.  Lab Results  Component Value Date   WBC 18.1* 08/26/2013   HGB 7.6* 08/26/2013   HCT 20.8* 08/26/2013   MCV 90.4 08/26/2013   PLT 441* 08/26/2013   Lab Results  Component Value Date   CREATININE 0.71 08/26/2013   BUN 9 08/26/2013   NA 140 08/26/2013   K 3.6* 08/26/2013   CL 104 08/26/2013   CO2 22 08/26/2013    No results found for this basename: HGBA1C   Lipid Panel     Component Value Date/Time   CHOL 63 06/04/2013 0524   TRIG 63 06/04/2013 0524   HDL 24* 06/04/2013 0524   CHOLHDL 2.6 06/04/2013 0524   VLDL 13 06/04/2013 0524   LDLCALC 26 06/04/2013 0524       Assessment and plan:   1. Hb-SS disease without crisis - Sickle cell disease - Pt was on Hydrea 100 mg daily which was held on 07/01/2013 due to low indices. Pt was scheduled to follow up for labs in the next week but has not shown up for any subsequent visits. Thus the Hydrea was not resumed . We discussed the need for good hydration, monitoring of hydration status, avoidance of heat, cold, stress, and infection triggers. We  discussed the risks and benefits of Hydrea, including bone marrow suppression, the possibility of GI upset, skin ulcers, hair thinning, and teratogenicity. The patient was reminded of the need to seek medical attention of any symptoms of bleeding, anemia, or infection. Continue folic acid 1 mg daily to prevent aplastic bone marrow crises. I will check CBC with diff today and resume Hydrea if indices permit.  - CBC with Differential - Comprehensive metabolic panel - Reticulocytes - folic acid (FOLVITE) 1 MG tablet; Take 1 tablet (1 mg total) by mouth every morning.  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  Patient denies severe recurrent wheezes, shortness of breath with exercise, or persistent cough.   - Cardiac - Pt has diagnosed pulmonary HTN by  ECHO. The last ECHO on 06/03/2013 showed a TRV of 403 cm/sec. He has had several referrals to Cardiology none of which he has kept. His last appointment was on 07/04/2013 for which heh did not show. He sites his work schedule as the reason for missing the appointments. He has chosen to follow up with Dr. Rockwell Alexandria and reports that he has a scheduled appointment in the next month.   - Eye - High risk of proliferative retinopathy. He has not had an eye examination in more than 1 year despite several appointments being scheduled for him. I have  5. Immunization status - She declines vaccines today. Yearly influenza vaccination is recommended, as well as being up to date with Meningococcal and Pneumococcal vaccines.   - Acute and chronic painful episodes - Pt is currently on MS Contin 15 mg BID and Dilaudid 4 mg # 180 tabs. We have discussed increasing MS Contin to 30 mg as he has had consistent use of his Dilaudid however pt is reluctant to increase his long acting medication as he states it causes him to have a "foggy brain", and he is unable to think well when MS Contin increased.   We  discussed that he is to receive his Schedule II prescriptions only from [place]. He is also aware that his prescription history is available to [place] online through the [name]. Controlled substance agreement signed (Date). We reminded (Pt) that all patients receiving Schedule II narcotics must be seen for follow up every three months. We reviewed the terms of our pain agreement, including the need to keep medicines in a safe locked location away from children or pets, and the need to report excess sedation or constipation, measures to avoid constipation, and policies related to early refills and stolen prescriptions. According to the [place] Chronic [facility] program, we have reviewed details related to analgesia, adverse effects, aberrant behaviors.  - HYDROmorphone (DILAUDID) 4 MG tablet; Take 1 tablet (4 mg total) by mouth every 4 (four) hours as needed for severe pain.  Dispense: 90 tablet; Refill: 0 - morphine (MS CONTIN) 15 MG 12 hr tablet; Take 1 tablet (15 mg total) by mouth 2 (two) times daily.  Dispense: 60 tablet; Refill: 0  - Iron overload from chronic transfusion: Pt has been prescribed Exjade but has not been adherent to taking the medication. His last Ferritin levels were 2055 1 year ago. Will check Ferritin levels today.   - Vitamin D deficiency -Pt has had persistently low Vitamin-D levels but has noto been adherent to Vitamin-D supplelment. Will prescribe Drisdol 50,000 units weekly.    The above recommendations are taken from the NIH Evidence-Based Management of Sickle Cell Disease: Expert Panel Report, 20149.    2. Pulmonary HTN Pt has diagnosed pulmonary HTN by ECHO. The last ECHO on 06/03/2013 showed a TRV of 403 cm/sec. He has had several referrals to Cardiology none of which he has kept. His last appointment was on 07/04/2013 for which he did not show. He sites his work schedule as the reason for missing the appointments. He has chosen to follow up with Dr. Rockwell Alexandria and reports that he has  a scheduled appointment in the next month.   3. Unspecified vitamin D deficiency - Pt has had persistently low Vitamin-D levels but has noto been adherent to Vitamin-D supplelment. Will prescribe Drisdol 50,000 units weekly.   4. Hx of pulmonary embolus/Chronic anticoagulation - Pt is on chronic anticoagulation with Xarelto for recurrent PE.  - rivaroxaban (XARELTO)  20 MG TABS tablet; Take 1 tablet (20 mg total) by mouth every morning.  Dispense: 30 tablet; Refill: 0  5. Essential hypertension -  Diastolic BP elevated however patient refusing any further medications or change in medications. Will continue current medications and recheck on next appointment. - lisinopril (PRINIVIL,ZESTRIL) 10 MG tablet; Take 1 tablet (10 mg total) by mouth daily.  Dispense: 30 tablet; Refill: 0 - metoprolol tartrate (LOPRESSOR) 25 MG tablet; Take 1 tablet (25 mg total) by mouth daily.  Dispense: 30 tablet; Refill: 6  6. Diastolic congestive heart failure, NYHA class 1 - Continue Lisinopril and Metoprolol. Currently clinically compensated.  7.  Alcohol consumption of one to four drinks per day - I have had multiple discussions with Mr. Hellenbrand and he is in a pre-contemplative state and not interested in information on cessation.  8. Insomnia - Periodically using Ambien. - zolpidem (AMBIEN) 10 MG tablet; Take 1 tablet (10 mg total) by mouth at bedtime as needed for sleep.  Dispense: 30 tablet; Refill: 0      Return in about 4 weeks (around 09/19/2013) for Hb SS without crisis, HTN, Vitamin D Deficiency.  The patient was given clear instructions to go to ER or return to medical center if symptoms don't improve, worsen or new problems develop. The patient verbalized understanding. The patient was told to call to get lab results if they haven't heard anything in the next week.     This note has been created with Surveyor, quantity. Any transcriptional errors are  unintentional.    Adarrius Graeff A., MD [place], [place]   08/26/2013, 11:24 AM

## 2013-08-23 LAB — COMPREHENSIVE METABOLIC PANEL
ALK PHOS: 86 U/L (ref 39–117)
ALT: 20 U/L (ref 0–53)
AST: 33 U/L (ref 0–37)
Albumin: 4.2 g/dL (ref 3.5–5.2)
BILIRUBIN TOTAL: 7.7 mg/dL — AB (ref 0.2–1.2)
BUN: 8 mg/dL (ref 6–23)
CO2: 22 mEq/L (ref 19–32)
Calcium: 9.1 mg/dL (ref 8.4–10.5)
Chloride: 106 mEq/L (ref 96–112)
Creat: 0.64 mg/dL (ref 0.50–1.35)
GLUCOSE: 97 mg/dL (ref 70–99)
POTASSIUM: 3.9 meq/L (ref 3.5–5.3)
SODIUM: 138 meq/L (ref 135–145)
Total Protein: 7.5 g/dL (ref 6.0–8.3)

## 2013-08-23 LAB — CBC WITH DIFFERENTIAL/PLATELET
BASOS ABS: 0 10*3/uL (ref 0.0–0.1)
BASOS PCT: 0 % (ref 0–1)
Eosinophils Absolute: 0.4 10*3/uL (ref 0.0–0.7)
Eosinophils Relative: 2 % (ref 0–5)
HCT: 24.2 % — ABNORMAL LOW (ref 39.0–52.0)
Hemoglobin: 8.3 g/dL — ABNORMAL LOW (ref 13.0–17.0)
Lymphocytes Relative: 17 % (ref 12–46)
Lymphs Abs: 3.2 10*3/uL (ref 0.7–4.0)
MCH: 31.6 pg (ref 26.0–34.0)
MCHC: 34.3 g/dL (ref 30.0–36.0)
MCV: 92 fL (ref 78.0–100.0)
MONO ABS: 1.9 10*3/uL — AB (ref 0.1–1.0)
Monocytes Relative: 10 % (ref 3–12)
NEUTROS ABS: 13.6 10*3/uL — AB (ref 1.7–7.7)
NEUTROS PCT: 71 % (ref 43–77)
Platelets: 553 10*3/uL — ABNORMAL HIGH (ref 150–400)
RBC: 2.63 MIL/uL — ABNORMAL LOW (ref 4.22–5.81)
RDW: 21.8 % — AB (ref 11.5–15.5)
WBC: 19.1 10*3/uL — ABNORMAL HIGH (ref 4.0–10.5)

## 2013-08-23 LAB — RETICULOCYTES
ABS RETIC: 549.7 10*3/uL — AB (ref 19.0–186.0)
RBC.: 2.63 MIL/uL — AB (ref 4.22–5.81)
Retic Ct Pct: 20.9 % — ABNORMAL HIGH (ref 0.4–2.3)

## 2013-08-23 LAB — FERRITIN: FERRITIN: 2334 ng/mL — AB (ref 22–322)

## 2013-08-23 IMAGING — CR DG CHEST 1V PORT
1 series · 1 of 1 positions shown · non-contrast
Comparison: 09/09/2010

CLINICAL DATA: Sickle cell crisis, leukocytosis

PORTABLE CHEST - 1 VIEW

[view not recorded]
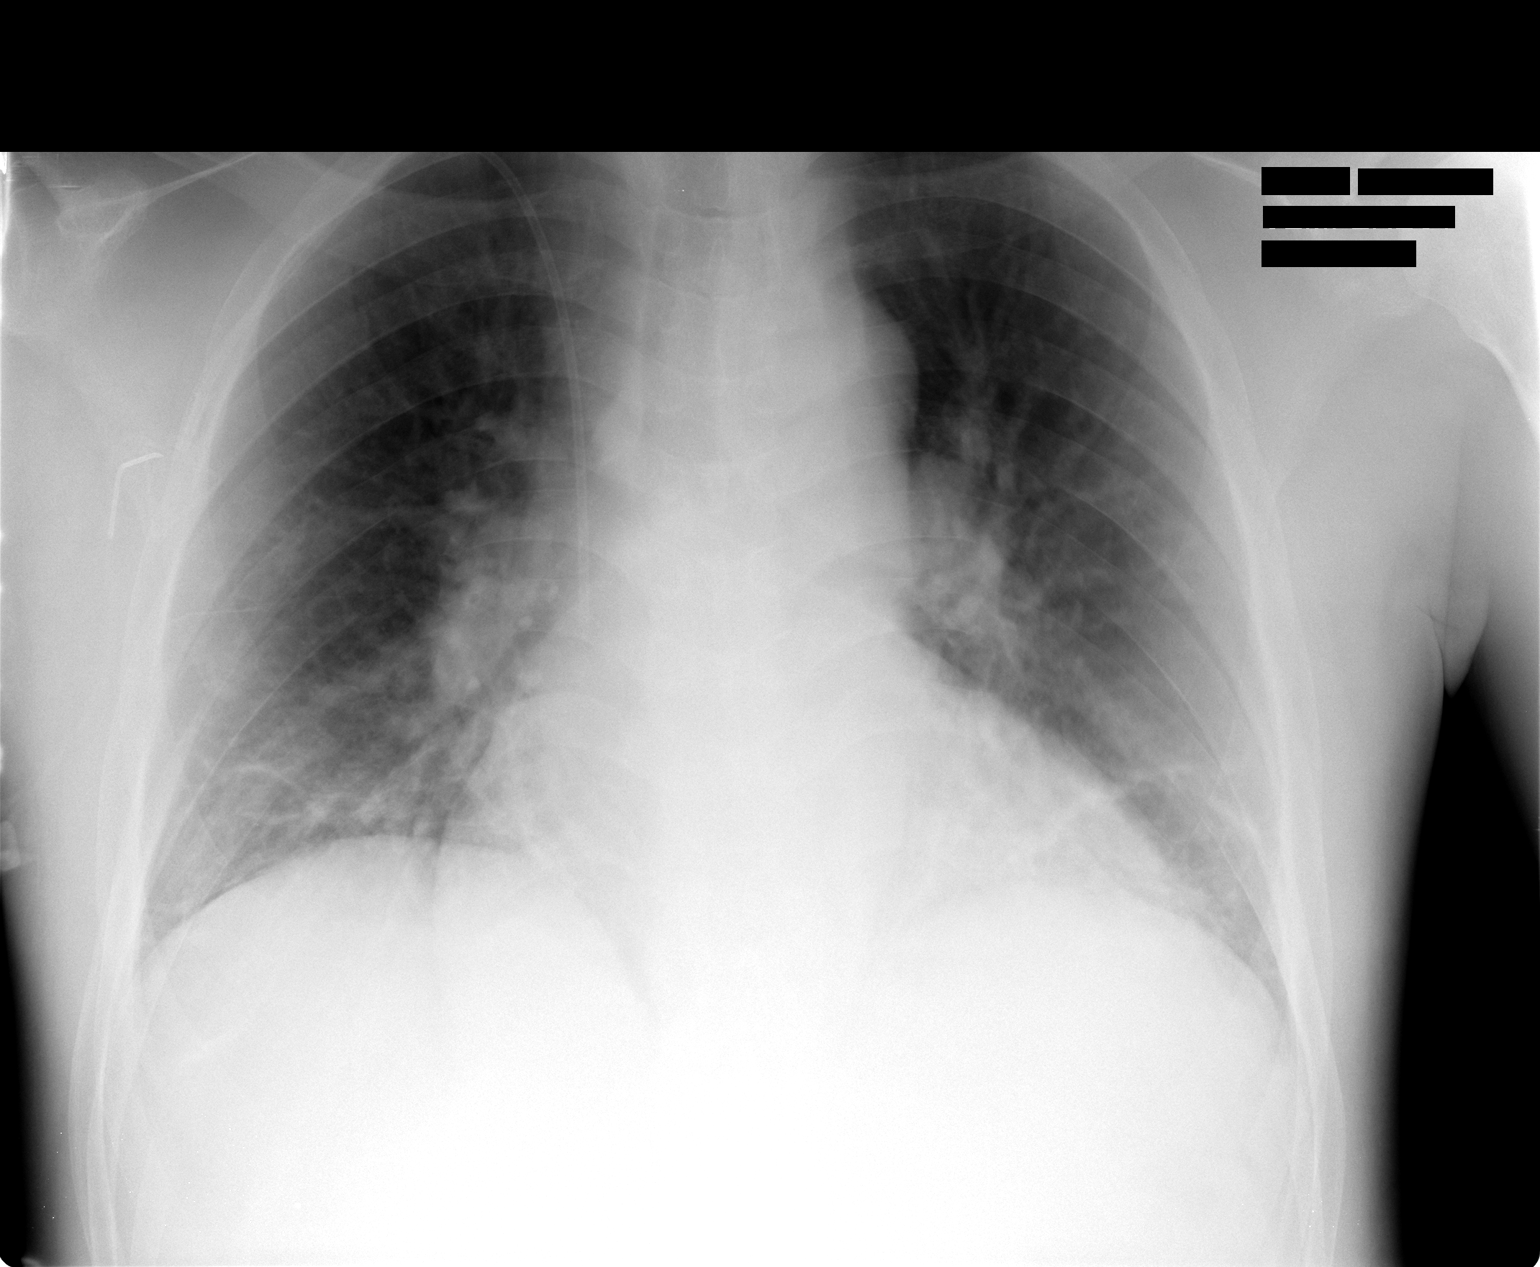

[1 of 1 positions shown; findings below may reference images not displayed]

FINDINGS: Right IJ port catheter tip in the SVC.  Heart is enlarged
with vascular congestion and worsening streaky basilar atelectasis.
No effusion, definite edema, consolidation, pneumonia, or
pneumothorax.  Midline trachea.
IMPRESSION: Cardiomegaly with vascular congestion

Increased basilar atelectasis

## 2013-08-25 ENCOUNTER — Inpatient Hospital Stay (HOSPITAL_COMMUNITY)
Admission: EM | Admit: 2013-08-25 | Discharge: 2013-08-29 | DRG: 812 | Disposition: A | Discharge status: Home / Self Care | Payer: Medicare Other | Attending: Internal Medicine | Admitting: Internal Medicine

## 2013-08-25 ENCOUNTER — Emergency Department (HOSPITAL_COMMUNITY): Payer: Medicare Other

## 2013-08-25 ENCOUNTER — Encounter (HOSPITAL_COMMUNITY): Payer: Self-pay | Admitting: Emergency Medicine

## 2013-08-25 DIAGNOSIS — M8700 Idiopathic aseptic necrosis of unspecified bone: Secondary | ICD-10-CM

## 2013-08-25 DIAGNOSIS — J209 Acute bronchitis, unspecified: Secondary | ICD-10-CM | POA: Diagnosis present

## 2013-08-25 DIAGNOSIS — D5700 Hb-SS disease with crisis, unspecified: Secondary | ICD-10-CM | POA: Diagnosis present

## 2013-08-25 DIAGNOSIS — Z86711 Personal history of pulmonary embolism: Secondary | ICD-10-CM

## 2013-08-25 DIAGNOSIS — D72829 Elevated white blood cell count, unspecified: Secondary | ICD-10-CM | POA: Diagnosis present

## 2013-08-25 DIAGNOSIS — Z86718 Personal history of other venous thrombosis and embolism: Secondary | ICD-10-CM | POA: Diagnosis not present

## 2013-08-25 DIAGNOSIS — R071 Chest pain on breathing: Secondary | ICD-10-CM | POA: Diagnosis not present

## 2013-08-25 DIAGNOSIS — D571 Sickle-cell disease without crisis: Secondary | ICD-10-CM | POA: Diagnosis not present

## 2013-08-25 DIAGNOSIS — R0602 Shortness of breath: Secondary | ICD-10-CM | POA: Diagnosis not present

## 2013-08-25 DIAGNOSIS — Q8901 Asplenia (congenital): Secondary | ICD-10-CM

## 2013-08-25 DIAGNOSIS — I10 Essential (primary) hypertension: Secondary | ICD-10-CM | POA: Diagnosis present

## 2013-08-25 DIAGNOSIS — Z87891 Personal history of nicotine dependence: Secondary | ICD-10-CM | POA: Diagnosis not present

## 2013-08-25 DIAGNOSIS — R0902 Hypoxemia: Secondary | ICD-10-CM

## 2013-08-25 DIAGNOSIS — Z7901 Long term (current) use of anticoagulants: Secondary | ICD-10-CM | POA: Diagnosis not present

## 2013-08-25 DIAGNOSIS — F411 Generalized anxiety disorder: Secondary | ICD-10-CM | POA: Diagnosis present

## 2013-08-25 DIAGNOSIS — Z96649 Presence of unspecified artificial hip joint: Secondary | ICD-10-CM | POA: Diagnosis not present

## 2013-08-25 DIAGNOSIS — Z832 Family history of diseases of the blood and blood-forming organs and certain disorders involving the immune mechanism: Secondary | ICD-10-CM

## 2013-08-25 DIAGNOSIS — D730 Hyposplenism: Secondary | ICD-10-CM

## 2013-08-25 DIAGNOSIS — E876 Hypokalemia: Secondary | ICD-10-CM

## 2013-08-25 DIAGNOSIS — Z79899 Other long term (current) drug therapy: Secondary | ICD-10-CM | POA: Diagnosis not present

## 2013-08-25 DIAGNOSIS — M25539 Pain in unspecified wrist: Secondary | ICD-10-CM | POA: Diagnosis not present

## 2013-08-25 DIAGNOSIS — Q8909 Congenital malformations of spleen: Secondary | ICD-10-CM | POA: Diagnosis not present

## 2013-08-25 MED ORDER — STERILE WATER FOR INJECTION IJ SOLN
INTRAMUSCULAR | Status: AC
  Administered 2013-08-25
  Filled 2013-08-25: qty 10

## 2013-08-25 MED ORDER — SODIUM CHLORIDE 0.9 % IV BOLUS (SEPSIS)
1000.0000 mL | Freq: Once | INTRAVENOUS | Status: AC
  Administered 2013-08-25: 1000 mL via INTRAVENOUS

## 2013-08-25 MED ORDER — HYDROMORPHONE HCL PF 2 MG/ML IJ SOLN
2.0000 mg | INTRAMUSCULAR | Status: AC | PRN
  Administered 2013-08-25: 2 mg via INTRAVENOUS
  Filled 2013-08-25: qty 1

## 2013-08-25 MED ORDER — DIPHENHYDRAMINE HCL 50 MG/ML IJ SOLN
25.0000 mg | Freq: Once | INTRAMUSCULAR | Status: AC
  Administered 2013-08-25: 25 mg via INTRAVENOUS
  Filled 2013-08-25: qty 1

## 2013-08-25 NOTE — ED Notes (Signed)
Per pt report: pt c/o his normal sickle cell pain in his left and right rib area.  Pain is most prominent on the left side.  Pt a/o x 4.  Pt ambulatory. NAD noted.  Pt's initial O2 sat was 84% RA.  Pt put on 2L [place] and pt is currently 94%.

## 2013-08-25 NOTE — ED Provider Notes (Signed)
CSN: [id number]     Arrival date & time 08/25/13  2129 History   First MD Initiated Contact with Patient 08/25/13 2229     Chief Complaint  Patient presents with  . Sickle Cell Pain Crisis     (Consider location/radiation/quality/duration/timing/severity/associated sxs/prior Treatment) HPI  33 year old male with history of sickle cell anemia, prior pulmonary embolism currently on Xarelto presents for evaluation of sickle cell related pain.patient reports for the past one week he has been having persistent bilateral wrist pain that felt similar to his usual sickle cell crisis. Pain is not well controlled with home medication.  When asked for SOB, pt report "getting short winded" after walking around.  He denies fever, chills, productive cough, hemoptysis, pleuritic cp, abd pain, n/v/d, or rash.  He has been taking his Xarelto regularly.  He does not know the causative factor which worsen his sickle cell pain.  He is here requesting pain control, but prefers not to be admitted.    Past Medical History  Diagnosis Date  . Sickle cell anemia   . Blood transfusion   . 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 Hypokalemia   . Mood disorder   . History of pulmonary embolus (PE)   . Avascular necrosis   . Leukocytosis     Chronic  . Thrombocytosis     Chronic  . Hypertension   . History of Clostridium difficile infection   . Uses marijuana   . Chronic anticoagulation   . Functional asplenia   . Former smoker   . Second hand tobacco smoke exposure   . Alcohol consumption of one to four drinks per day    Past Surgical History  Procedure Laterality Date  . Right hip replacement      08/2006  . Cholecystectomy      01/2008  . Porta cath placement    . Porta cath removal    . Umbilical hernia repair      01/2008  . Excision of left periauricular cyst      10/2009  . Excision of right ear lobe cyst with primary closur      11/2007  . Portacath placement   01/05/2012    Procedure: INSERTION PORT-A-CATH;  Surgeon: Odis Hollingshead, MD;  Location: [facility];  Service: General;  Laterality: N/A;  ultrasound guiced port a cath insertion with fluoroscopy   Family History  Problem Relation Age of Onset  . Sickle cell anemia Mother   . Sickle cell anemia Father   . Sickle cell trait Brother    History  Substance Use Topics  . Smoking status: Former Smoker -- 13 years    Types: Cigarettes    Quit date: 07/08/2010  . Smokeless tobacco: Never Used  . Alcohol Use: 3.6 oz/week    6 Shots of liquor per week    Review of Systems  All other systems reviewed and are negative.     Allergies  Morphine and related  Home Medications   Prior to Admission medications   Medication Sig Start Date End Date Taking? Authorizing Provider  celecoxib (CELEBREX) 200 MG capsule Take 1 capsule (200 mg total) by mouth 2 (two) times daily. 07/31/13  Yes Dorena Dew, FNP  folic acid (FOLVITE) 1 MG tablet Take 1 tablet (1 mg total) by mouth every morning. 08/22/13  Yes [name], MD  HYDROmorphone (DILAUDID) 4 MG tablet Take 1 tablet (4 mg total) by mouth every 4 (four) hours as  needed for severe pain. 08/22/13  Yes [name], MD  lisinopril (PRINIVIL,ZESTRIL) 10 MG tablet Take 1 tablet (10 mg total) by mouth daily. 08/22/13  Yes [name], MD  metoprolol tartrate (LOPRESSOR) 25 MG tablet Take 1 tablet (25 mg total) by mouth daily. 08/22/13  Yes [name], MD  morphine (MS CONTIN) 15 MG 12 hr tablet Take 1 tablet (15 mg total) by mouth 2 (two) times daily. 08/22/13  Yes [name], MD  potassium chloride SA (K-DUR,KLOR-CON) 20 MEQ tablet Take 1 tablet (20 mEq total) by mouth every morning. 07/31/13  Yes Dorena Dew, FNP  rivaroxaban (XARELTO) 20 MG TABS tablet Take 1 tablet (20 mg total) by mouth every morning. 08/22/13  Yes [name], MD  zolpidem (AMBIEN) 10 MG tablet Take 1 tablet (10 mg total) by mouth at  bedtime as needed for sleep. 08/22/13  Yes [name], MD   BP 120/82  Pulse 94  Temp(Src) 99.4 F (37.4 C) (Oral)  Resp 19  Ht 6' (1.829 m)  Wt 163 lb (73.936 kg)  BMI 22.10 kg/m2  SpO2 94% Physical Exam  Constitutional: He is oriented to person, place, and time. He appears well-developed and well-nourished. No distress.  HENT:  Head: Atraumatic.  Eyes: Conjunctivae are normal. Scleral icterus is present.  Neck: Normal range of motion. Neck supple.  Cardiovascular: Normal rate and regular rhythm.   Pulmonary/Chest: Effort normal and breath sounds normal. No respiratory distress. He has no wheezes. He exhibits no tenderness (generalized anterior chest wall pain without crepitus or emphysema. ).  Abdominal: Soft. There is no tenderness.  Musculoskeletal: He exhibits no edema.  Neurological: He is alert and oriented to person, place, and time.  Skin: No rash noted.  Psychiatric: He has a normal mood and affect.    ED Course  Procedures (including critical care time)  11:13 PM Pt with sickle cell related pain, requesting for pain control.  He has a documented hypoxia with O2 of 84% on RA on arrival.  O2 improves with [place] 2L.  Report taking Xarelto as prescribed.  Will ambulate pt with O2 monitor.  Care discussed with Dr. Vanita Panda who recommend CXR.    1:25 AM Pt desats to 84% on RA but denies SOB.  Pt continues to endorse ribs pain, CXR without acute infiltrates.  Plan to have pt admitted and treat for acute chest.  Pt may need IVC filter if recurrent PE.    1:48 AM I have consulted [facility], Dr. Posey Pronto who agrees to admit pt to tele, team 8 under his care.  He also request for chest CT angio.  Pt to be on 2L O2.    Labs Review Labs Reviewed  CBC WITH DIFFERENTIAL - Abnormal; Notable for the following:    WBC 16.6 (*)    RBC 2.31 (*)    Hemoglobin 7.5 (*)    HCT 20.9 (*)    RDW 21.5 (*)    Platelets 443 (*)    Monocytes Relative 13 (*)    Neutro Abs 10.9  (*)    Monocytes Absolute 2.2 (*)    Basophils Absolute 0.2 (*)    All other components within normal limits  RETICULOCYTES - Abnormal; Notable for the following:    Retic Ct Pct >23.0 (*)    RBC. 2.31 (*)    All other components within normal limits  BASIC METABOLIC PANEL  PROTIME-INR    Imaging Review Dg Chest 2 View  08/25/2013   CLINICAL DATA:  Sickle cell crisis.  Shortness of breath.  EXAM: CHEST  2 VIEW  COMPARISON:  08/13/2013 and 07/29/2013  FINDINGS: Power port in place. Chronic cardiomegaly. Pulmonary vascularity is within normal limits. There is slight chronic accentuation of the interstitial markings. No effusions. No acute osseous abnormality.  IMPRESSION: No acute findings. Chronic cardiomegaly. Chronic slight accentuation of the interstitial markings.   Electronically Signed   By: Rozetta Nunnery M.D.   On: 08/25/2013 23:55     EKG Interpretation None      MDM   Final diagnoses:  Sickle cell anemia with pain  Hypoxia    BP 132/81  Pulse 81  Temp(Src) 99.1 F (37.3 C) (Oral)  Resp 15  Ht 6' (1.829 m)  Wt 163 lb (73.936 kg)  BMI 22.10 kg/m2  SpO2 91%  I have reviewed nursing notes and vital signs. I personally reviewed the imaging tests through PACS system  I reviewed available ER/hospitalization records thought the EMR     Domenic Moras, [place] 08/26/13 0149

## 2013-08-25 NOTE — ED Notes (Addendum)
Pt had O2 sats of 86% put on 5L of O2 90%.

## 2013-08-26 ENCOUNTER — Encounter (HOSPITAL_COMMUNITY): Payer: Self-pay

## 2013-08-26 ENCOUNTER — Emergency Department (HOSPITAL_COMMUNITY): Payer: Medicare Other

## 2013-08-26 DIAGNOSIS — E876 Hypokalemia: Secondary | ICD-10-CM

## 2013-08-26 DIAGNOSIS — Z7901 Long term (current) use of anticoagulants: Secondary | ICD-10-CM

## 2013-08-26 DIAGNOSIS — Q8909 Congenital malformations of spleen: Secondary | ICD-10-CM

## 2013-08-26 DIAGNOSIS — Z86711 Personal history of pulmonary embolism: Secondary | ICD-10-CM

## 2013-08-26 DIAGNOSIS — D5700 Hb-SS disease with crisis, unspecified: Principal | ICD-10-CM | POA: Diagnosis present

## 2013-08-26 DIAGNOSIS — I10 Essential (primary) hypertension: Secondary | ICD-10-CM

## 2013-08-26 LAB — COMPREHENSIVE METABOLIC PANEL
ALBUMIN: 3.9 g/dL (ref 3.5–5.2)
ALK PHOS: 89 U/L (ref 39–117)
ALT: 21 U/L (ref 0–53)
AST: 41 U/L — ABNORMAL HIGH (ref 0–37)
Anion gap: 14 (ref 5–15)
BILIRUBIN TOTAL: 7.6 mg/dL — AB (ref 0.3–1.2)
BUN: 9 mg/dL (ref 6–23)
CHLORIDE: 104 meq/L (ref 96–112)
CO2: 22 meq/L (ref 19–32)
CREATININE: 0.71 mg/dL (ref 0.50–1.35)
Calcium: 9 mg/dL (ref 8.4–10.5)
GFR calc Af Amer: >90 mL/min (ref >90)
Glucose, Bld: 121 mg/dL — ABNORMAL HIGH (ref 70–99)
POTASSIUM: 3.6 meq/L — AB (ref 3.7–5.3)
Sodium: 140 mEq/L (ref 137–147)
Total Protein: 7.1 g/dL (ref 6.0–8.3)

## 2013-08-26 LAB — CBC WITH DIFFERENTIAL/PLATELET
Basophils Absolute: 0.2 10*3/uL — ABNORMAL HIGH (ref 0.0–0.1)
Basophils Absolute: 0.2 10*3/uL — ABNORMAL HIGH (ref 0.0–0.1)
Basophils Relative: 1 % (ref 0–1)
Basophils Relative: 1 % (ref 0–1)
EOS ABS: 0.4 10*3/uL (ref 0.0–0.7)
Eosinophils Absolute: 0.3 10*3/uL (ref 0.0–0.7)
Eosinophils Relative: 2 % (ref 0–5)
Eosinophils Relative: 2 % (ref 0–5)
HCT: 20.9 % — ABNORMAL LOW (ref 39.0–52.0)
HEMATOCRIT: 20.8 % — AB (ref 39.0–52.0)
HEMOGLOBIN: 7.6 g/dL — AB (ref 13.0–17.0)
HEMOGLOBIN: 7.5 g/dL — AB (ref 13.0–17.0)
LYMPHS PCT: 16 % (ref 12–46)
Lymphocytes Relative: 18 % (ref 12–46)
Lymphs Abs: 2.9 10*3/uL (ref 0.7–4.0)
Lymphs Abs: 3 10*3/uL (ref 0.7–4.0)
MCH: 32.5 pg (ref 26.0–34.0)
MCH: 33 pg (ref 26.0–34.0)
MCHC: 35.9 g/dL (ref 30.0–36.0)
MCHC: 36.5 g/dL — AB (ref 30.0–36.0)
MCV: 90.4 fL (ref 78.0–100.0)
MCV: 90.5 fL (ref 78.0–100.0)
MONO ABS: 2.2 10*3/uL — AB (ref 0.1–1.0)
Monocytes Absolute: 2.7 10*3/uL — ABNORMAL HIGH (ref 0.1–1.0)
Monocytes Relative: 13 % — ABNORMAL HIGH (ref 3–12)
Monocytes Relative: 15 % — ABNORMAL HIGH (ref 3–12)
NEUTROS ABS: 11.9 10*3/uL — AB (ref 1.7–7.7)
NEUTROS PCT: 66 % (ref 43–77)
Neutro Abs: 10.9 10*3/uL — ABNORMAL HIGH (ref 1.7–7.7)
Neutrophils Relative %: 66 % (ref 43–77)
Platelets: 441 10*3/uL — ABNORMAL HIGH (ref 150–400)
Platelets: 443 10*3/uL — ABNORMAL HIGH (ref 150–400)
RBC: 2.3 MIL/uL — ABNORMAL LOW (ref 4.22–5.81)
RBC: 2.31 MIL/uL — ABNORMAL LOW (ref 4.22–5.81)
RDW: 21 % — AB (ref 11.5–15.5)
RDW: 21.5 % — ABNORMAL HIGH (ref 11.5–15.5)
WBC: 16.6 10*3/uL — ABNORMAL HIGH (ref 4.0–10.5)
WBC: 18.1 10*3/uL — ABNORMAL HIGH (ref 4.0–10.5)

## 2013-08-26 LAB — BASIC METABOLIC PANEL
ANION GAP: 15 (ref 5–15)
BUN: 9 mg/dL (ref 6–23)
CO2: 20 meq/L (ref 19–32)
Calcium: 8.9 mg/dL (ref 8.4–10.5)
Chloride: 104 mEq/L (ref 96–112)
Creatinine, Ser: 0.56 mg/dL (ref 0.50–1.35)
Glucose, Bld: 90 mg/dL (ref 70–99)
Potassium: 3.8 mEq/L (ref 3.7–5.3)
Sodium: 139 mEq/L (ref 137–147)

## 2013-08-26 LAB — PROTIME-INR
INR: 1.29 (ref 0.00–1.49)
PROTHROMBIN TIME: 16.1 s — AB (ref 11.6–15.2)

## 2013-08-26 LAB — RETICULOCYTES
RBC.: 2.3 MIL/uL — ABNORMAL LOW (ref 4.22–5.81)
RBC.: 2.31 MIL/uL — ABNORMAL LOW (ref 4.22–5.81)

## 2013-08-26 LAB — MRSA PCR SCREENING: MRSA by PCR: NEGATIVE

## 2013-08-26 LAB — MAGNESIUM: MAGNESIUM: 1.7 mg/dL (ref 1.5–2.5)

## 2013-08-26 MED ORDER — MORPHINE SULFATE ER 15 MG PO TBCR
15.0000 mg | EXTENDED_RELEASE_TABLET | Freq: Two times a day (BID) | ORAL | Status: DC
  Administered 2013-08-26 (×3): 15 mg via ORAL
  Filled 2013-08-26 (×3): qty 1

## 2013-08-26 MED ORDER — IOHEXOL 350 MG/ML SOLN
100.0000 mL | Freq: Once | INTRAVENOUS | Status: AC | PRN
  Administered 2013-08-26: 100 mL via INTRAVENOUS

## 2013-08-26 MED ORDER — SODIUM CHLORIDE 0.9 % IV SOLN
INTRAVENOUS | Status: DC
  Administered 2013-08-26: 05:00:00 via INTRAVENOUS

## 2013-08-26 MED ORDER — FAMOTIDINE 20 MG PO TABS
20.0000 mg | ORAL_TABLET | Freq: Two times a day (BID) | ORAL | Status: DC
Start: 2013-08-26 — End: 2013-08-29
  Administered 2013-08-26 – 2013-08-29 (×5): 20 mg via ORAL
  Filled 2013-08-26 (×8): qty 1

## 2013-08-26 MED ORDER — ONDANSETRON HCL 4 MG/2ML IJ SOLN: 4.0000 mg | Freq: Three times a day (TID) | INTRAMUSCULAR | Status: AC | PRN

## 2013-08-26 MED ORDER — HYDROMORPHONE 0.3 MG/ML IV SOLN
INTRAVENOUS | Status: DC
  Administered 2013-08-26 (×2): via INTRAVENOUS
  Administered 2013-08-26: 5.15 mg via INTRAVENOUS
  Administered 2013-08-26: 4.64 mg via INTRAVENOUS
  Filled 2013-08-26 (×2): qty 25

## 2013-08-26 MED ORDER — DEXTROSE-NACL 5-0.45 % IV SOLN
INTRAVENOUS | Status: DC
  Administered 2013-08-26 – 2013-08-27 (×5): via INTRAVENOUS

## 2013-08-26 MED ORDER — SODIUM CHLORIDE 0.9 % IJ SOLN: 9.0000 mL | INTRAMUSCULAR | Status: DC | PRN

## 2013-08-26 MED ORDER — FOLIC ACID 1 MG PO TABS: 1.0000 mg | ORAL_TABLET | Freq: Every day | ORAL | Status: DC

## 2013-08-26 MED ORDER — RIVAROXABAN 20 MG PO TABS
20.0000 mg | ORAL_TABLET | Freq: Every day | ORAL | Status: DC
  Administered 2013-08-26 – 2013-08-28 (×3): 20 mg via ORAL
  Filled 2013-08-26 (×5): qty 1

## 2013-08-26 MED ORDER — HYDROMORPHONE 0.3 MG/ML IV SOLN
INTRAVENOUS | Status: DC
  Administered 2013-08-26: 4.44 mg via INTRAVENOUS
  Administered 2013-08-26: 14:00:00 via INTRAVENOUS
  Administered 2013-08-26: 6.55 mg via INTRAVENOUS
  Administered 2013-08-26: 0.32 mg via INTRAVENOUS
  Administered 2013-08-27: 4.79 mg via INTRAVENOUS
  Administered 2013-08-27: 3.17 mg via INTRAVENOUS
  Administered 2013-08-27: 12:00:00 via INTRAVENOUS
  Administered 2013-08-27: 1.34 mg via INTRAVENOUS
  Administered 2013-08-27: 6.17 mg via INTRAVENOUS
  Administered 2013-08-27: 4.79 mg via INTRAVENOUS
  Administered 2013-08-27: 2.83 mg via INTRAVENOUS
  Administered 2013-08-28: 5.15 mg via INTRAVENOUS
  Administered 2013-08-28: 3.19 mg via INTRAVENOUS
  Administered 2013-08-28: 2.59 mg via INTRAVENOUS
  Filled 2013-08-26 (×8): qty 25

## 2013-08-26 MED ORDER — DIPHENHYDRAMINE HCL 25 MG PO CAPS
25.0000 mg | ORAL_CAPSULE | Freq: Four times a day (QID) | ORAL | Status: DC | PRN
  Administered 2013-08-26 – 2013-08-29 (×7): 25 mg via ORAL
  Filled 2013-08-26 (×6): qty 1

## 2013-08-26 MED ORDER — LISINOPRIL 10 MG PO TABS
10.0000 mg | ORAL_TABLET | Freq: Every day | ORAL | Status: DC
  Administered 2013-08-26 – 2013-08-29 (×4): 10 mg via ORAL
  Filled 2013-08-26 (×4): qty 1

## 2013-08-26 MED ORDER — BIOTENE DRY MOUTH MT LIQD
15.0000 mL | Freq: Two times a day (BID) | OROMUCOSAL | Status: DC
Start: 2013-08-26 — End: 2013-08-29
  Administered 2013-08-26 – 2013-08-29 (×6): 15 mL via OROMUCOSAL

## 2013-08-26 MED ORDER — ONDANSETRON HCL 4 MG/2ML IJ SOLN: 4.0000 mg | Freq: Four times a day (QID) | INTRAMUSCULAR | Status: DC | PRN

## 2013-08-26 MED ORDER — NALOXONE HCL 0.4 MG/ML IJ SOLN: 0.4000 mg | INTRAMUSCULAR | Status: DC | PRN

## 2013-08-26 MED ORDER — KETOROLAC TROMETHAMINE 30 MG/ML IJ SOLN
30.0000 mg | Freq: Four times a day (QID) | INTRAMUSCULAR | Status: DC
  Administered 2013-08-26 – 2013-08-29 (×12): 30 mg via INTRAVENOUS
  Filled 2013-08-26 (×12): qty 1
  Filled 2013-08-26: qty 2
  Filled 2013-08-26 (×3): qty 1

## 2013-08-26 MED ORDER — FOLIC ACID 1 MG PO TABS
1.0000 mg | ORAL_TABLET | Freq: Every morning | ORAL | Status: DC
  Administered 2013-08-26 – 2013-08-29 (×4): 1 mg via ORAL
  Filled 2013-08-26 (×4): qty 1

## 2013-08-26 MED ORDER — HYDROMORPHONE HCL 4 MG PO TABS: 4.0000 mg | ORAL_TABLET | ORAL | Status: DC | PRN

## 2013-08-26 MED ORDER — HYDROMORPHONE HCL PF 2 MG/ML IJ SOLN
2.0000 mg | INTRAMUSCULAR | Status: AC | PRN
  Administered 2013-08-26: 2 mg via INTRAVENOUS
  Filled 2013-08-26: qty 1

## 2013-08-26 MED ORDER — FAMOTIDINE IN NACL 20-0.9 MG/50ML-% IV SOLN
20.0000 mg | Freq: Two times a day (BID) | INTRAVENOUS | Status: DC
  Administered 2013-08-26 – 2013-08-27 (×2): 20 mg via INTRAVENOUS
  Filled 2013-08-26 (×7): qty 50

## 2013-08-26 MED ORDER — ZOLPIDEM TARTRATE 10 MG PO TABS
10.0000 mg | ORAL_TABLET | Freq: Every evening | ORAL | Status: DC | PRN
  Administered 2013-08-27 – 2013-08-28 (×2): 10 mg via ORAL
  Filled 2013-08-26 (×2): qty 1

## 2013-08-26 MED ORDER — HYDROMORPHONE HCL PF 1 MG/ML IJ SOLN
1.0000 mg | INTRAMUSCULAR | Status: DC | PRN
  Administered 2013-08-26: 1 mg via INTRAVENOUS
  Filled 2013-08-26: qty 1

## 2013-08-26 MED ORDER — DIPHENHYDRAMINE HCL 25 MG PO CAPS
25.0000 mg | ORAL_CAPSULE | Freq: Four times a day (QID) | ORAL | Status: DC | PRN
  Filled 2013-08-26: qty 1

## 2013-08-26 MED ORDER — SODIUM CHLORIDE 0.9 % IJ SOLN: 10.0000 mL | INTRAMUSCULAR | Status: DC | PRN

## 2013-08-26 NOTE — ED Provider Notes (Signed)
Medical screening examination/treatment/procedure(s) were performed by non-physician practitioner and as supervising physician I was immediately available for consultation/collaboration.     Carmin Muskrat, MD 08/26/13 1535

## 2013-08-26 NOTE — Progress Notes (Signed)
Pt has been on 3-4 liters oxygen satting 88-90% since arrived at hospital.  I removed finger oxygen probe and applied ear probe and pts sats on 2 liters are 97-100.  Informed pt to ask for ear probe for future admissions. Fara Olden P

## 2013-08-26 NOTE — Progress Notes (Signed)
Educated pt on use of PCA pump. Pt also refused having the bed alarm on. Explained reasoning for bed alarm to pt, pt understood. Told pt to call staff if he needs any assistance to prevent falls. Will continue to monitor pt.

## 2013-08-26 NOTE — Progress Notes (Signed)
Patient states that his pain has been uncontrolled throughout the day despite using the PCA pump. Patient has only used 5-7 mg per four hours although according to this orders, he is able to have up to 15 mg per four hours. Patient educated by RN on how to use PCA pump, but became frustrated at [employer]. RN paged Dr. Alben Deeds, who rounded on patient earlier in the day. Dr. Alben Deeds slightly increased patients PCA dosage and added IV toradol. Patient still complaining that his pain is unrelieved. Patient given continued education of usage of PCA pump. Will continue to monitor.

## 2013-08-26 NOTE — Progress Notes (Signed)
Patient ID: [name], male   DOB: 1980-09-18, 33 y.o.   MRN: [id number] Post round addendum:  Pt states pain was a 7-8/10 in his bilateral lower ribs and back, typical of his sickle cell pain. Pain has been ongoing for several days without and resolution with home management. He is finding some relief on current PCA settings. Orders an problem list  were reviewed. Will switch IV fluids to D5/0.45%. Otherwise continue current management.  -Kalman Shan, MD

## 2013-08-26 NOTE — Progress Notes (Signed)
ANTICOAGULATION CONSULT NOTE - Initial Consult  Pharmacy Consult for rivaroxaban Indication: VTE treatment  Allergies  Allergen Reactions  . Morphine And Related Hives and Rash    Pt states he can take PO, but not IV and he is able to tolerate Dilaudid with no reactions.    Patient Measurements: Height: 6' (182.9 cm) Weight: 162 lb 6.4 oz (73.664 kg) IBW/kg (Calculated) : 77.6 Heparin Dosing Weight:   Vital Signs: Temp: 98.3 F (36.8 C) (07/27 0250) Temp src: Oral (07/27 0250) BP: 123/90 mmHg (07/27 0250) Pulse Rate: 86 (07/27 0250)  Labs:  Recent Labs  08/25/13 2348 08/26/13 0146 08/26/13 0400  HGB 7.5*  --  7.6*  HCT 20.9*  --  20.8*  PLT 443*  --  441*  LABPROT  --  16.1*  --   INR  --  1.29  --   CREATININE 0.56  --   --     Estimated Creatinine Clearance: 135.6 ml/min (by C-G formula based on Cr of 0.56).   Medical History: Past Medical History  Diagnosis Date  . Sickle cell anemia   . Blood transfusion   . 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 Hypokalemia   . Mood disorder   . History of pulmonary embolus (PE)   . Avascular necrosis   . Leukocytosis     Chronic  . Thrombocytosis     Chronic  . Hypertension   . History of Clostridium difficile infection   . Uses marijuana   . Chronic anticoagulation   . Functional asplenia   . Former smoker   . Second hand tobacco smoke exposure   . Alcohol consumption of one to four drinks per day     Medications:  Prescriptions prior to admission  Medication Sig Dispense Refill  . celecoxib (CELEBREX) 200 MG capsule Take 1 capsule (200 mg total) by mouth 2 (two) times daily.  60 capsule  1  . folic acid (FOLVITE) 1 MG tablet Take 1 tablet (1 mg total) by mouth every morning.  30 tablet  11  . HYDROmorphone (DILAUDID) 4 MG tablet Take 1 tablet (4 mg total) by mouth every 4 (four) hours as needed for severe pain.  90 tablet  0  . lisinopril (PRINIVIL,ZESTRIL) 10 MG tablet Take 1  tablet (10 mg total) by mouth daily.  30 tablet  0  . metoprolol tartrate (LOPRESSOR) 25 MG tablet Take 1 tablet (25 mg total) by mouth daily.  30 tablet  6  . morphine (MS CONTIN) 15 MG 12 hr tablet Take 1 tablet (15 mg total) by mouth 2 (two) times daily.  60 tablet  0  . potassium chloride SA (K-DUR,KLOR-CON) 20 MEQ tablet Take 1 tablet (20 mEq total) by mouth every morning.  30 tablet  3  . rivaroxaban (XARELTO) 20 MG TABS tablet Take 1 tablet (20 mg total) by mouth every morning.  30 tablet  0  . zolpidem (AMBIEN) 10 MG tablet Take 1 tablet (10 mg total) by mouth at bedtime as needed for sleep.  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    Goal of Therapy: Rivaroxaban based on manufacturer dosing recommendations.     Plan:  Continue home dose of 20mg  rivaroxaban PO daily.  Tyler Deis, Shea Stakes [place] 08/26/2013,4:39 AM

## 2013-08-26 NOTE — Care Management Note (Signed)
    Page 1 of 1   08/26/2013     2:49:54 PM CARE MANAGEMENT NOTE 08/26/2013  Patient:  [name], [name]   Account Number:  [id number]  Date Initiated:  08/26/2013  Documentation initiated by:  Dessa Phi  Subjective/Objective Assessment:   33 Y/O M ADMITTED W/[facility] 6/29-07/31/13.     Action/Plan:   FROM HOME.   Anticipated DC Date:  08/29/2013   Anticipated DC Plan:  [place]  CM consult      Choice offered to / List presented to:             Status of service:  In process, will continue to follow Medicare Important Message given?  NA - LOS <3 / Initial given by admissions (If response is "NO", the following Medicare IM given date fields will be blank) Date Medicare IM given:   Medicare IM given by:   Date Additional Medicare IM given:   Additional Medicare IM given by:    Discharge Disposition:    Per UR Regulation:  Reviewed for med. necessity/level of care/duration of stay  If discussed at [place] of Stay Meetings, dates discussed:    Comments:  08/26/13 Othel Hoogendoorn RN,BSN NCM [place].

## 2013-08-26 NOTE — Progress Notes (Signed)
Patient found to be taking off his nasal cannula frequently and touching buttons on PCA and IV pump. Patient educated that RN needed to be the one to silence beeping from PCA and IV pump and that the IV pump is often beeping due to patient not continuously wearing nasal cannula and continuous pulse ox sensor. Baxter Flattery, RN sent to patient's room to reinforce education. Will continue to monitor patient.

## 2013-08-26 NOTE — H&P (Signed)
[facility] History and Physical  Patient: [name]  XNA:[id number]  DOB: 10/11/1980  DOS: the patient was seen and examined on 08/26/2013 PCP: [name],[name], MD  Chief Complaint: Pain in her rib  HPI: [name] is a 33 y.o. male with Past medical history of sickle cell disease, pulmonary embolism, avascular necrosis, asplenia, hypertension. Patient presents with complaints of bilateral rib pain. This has started 3-4 days ago and has progressively got worse. He also has shortness of breath. He has dry cough. He denies any fever or chills. No pain nausea or vomiting or aspiration. No abdominal pain. No diarrhea or constipation. He complains of some pain in his back. No incontinence of bowel or bladder. No rash. No sick contacts. He mentions he is compliant with all his medication and there is no recent change in his medication.  The patient is coming from home. And at his baseline independent for most of his ADL.  Review of Systems: as mentioned in the history of present illness.  A Comprehensive review of the other systems is negative.  Past Medical History  Diagnosis Date  . Sickle cell anemia   . Blood transfusion   . 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 Hypokalemia   . Mood disorder   . History of pulmonary embolus (PE)   . Avascular necrosis   . Leukocytosis     Chronic  . Thrombocytosis     Chronic  . Hypertension   . History of Clostridium difficile infection   . Uses marijuana   . Chronic anticoagulation   . Functional asplenia   . Former smoker   . Second hand tobacco smoke exposure   . Alcohol consumption of one to four drinks per day    Past Surgical History  Procedure Laterality Date  . Right hip replacement      08/2006  . Cholecystectomy      01/2008  . Porta cath placement    . Porta cath removal    . Umbilical hernia repair      01/2008  . Excision of left periauricular cyst      10/2009  .  Excision of right ear lobe cyst with primary closur      11/2007  . Portacath placement  01/05/2012    Procedure: INSERTION PORT-A-CATH;  Surgeon: Odis Hollingshead, MD;  Location: [facility];  Service: General;  Laterality: N/A;  ultrasound guiced port a cath insertion with fluoroscopy   Social History:  reports that he quit smoking about 3 years ago. His smoking use included Cigarettes. He smoked 0.00 packs per day for 13 years. He has never used smokeless tobacco. He reports that he drinks about 3.6 ounces of alcohol per week. He reports that he uses illicit drugs (Marijuana) about twice per week.  Allergies  Allergen Reactions  . Morphine And Related Hives and Rash    Pt states he can take PO, but not IV and he is able to tolerate Dilaudid with no reactions.    Family History  Problem Relation Age of Onset  . Sickle cell anemia Mother   . Sickle cell anemia Father   . Sickle cell trait Brother     Prior to Admission medications   Medication Sig Start Date End Date Taking? Authorizing Provider  celecoxib (CELEBREX) 200 MG capsule Take 1 capsule (200 mg total) by mouth 2 (two) times daily. 07/31/13  Yes Dorena Dew, FNP  folic acid (FOLVITE) 1  MG tablet Take 1 tablet (1 mg total) by mouth every morning. 08/22/13  Yes [name], MD  HYDROmorphone (DILAUDID) 4 MG tablet Take 1 tablet (4 mg total) by mouth every 4 (four) hours as needed for severe pain. 08/22/13  Yes [name], MD  lisinopril (PRINIVIL,ZESTRIL) 10 MG tablet Take 1 tablet (10 mg total) by mouth daily. 08/22/13  Yes [name], MD  metoprolol tartrate (LOPRESSOR) 25 MG tablet Take 1 tablet (25 mg total) by mouth daily. 08/22/13  Yes [name], MD  morphine (MS CONTIN) 15 MG 12 hr tablet Take 1 tablet (15 mg total) by mouth 2 (two) times daily. 08/22/13  Yes [name], MD  potassium chloride SA (K-DUR,KLOR-CON) 20 MEQ tablet Take 1 tablet (20 mEq total) by mouth every morning. 07/31/13   Yes Dorena Dew, FNP  rivaroxaban (XARELTO) 20 MG TABS tablet Take 1 tablet (20 mg total) by mouth every morning. 08/22/13  Yes [name], MD  zolpidem (AMBIEN) 10 MG tablet Take 1 tablet (10 mg total) by mouth at bedtime as needed for sleep. 08/22/13  Yes [name], MD    Physical Exam: Filed Vitals:   08/26/13 0250 08/26/13 0327 08/26/13 0432 08/26/13 0519  BP: 123/90   120/72  Pulse: 86   84  Temp: 98.3 F (36.8 C)   98.2 F (36.8 C)  TempSrc: Oral   Oral  Resp: 16 20  16   Height: 6' (1.829 m)     Weight: 73.664 kg (162 lb 6.4 oz)  73.664 kg (162 lb 6.4 oz)   SpO2: 96% 91%  92%    General: Alert, Awake and Oriented to Time, Place and Person. Appear in mild distress Eyes: PERRL ENT: Oral Mucosa clear moist. Neck: no JVD Cardiovascular: S1 and S2 Present, no Murmur, Peripheral Pulses Present Respiratory: Bilateral Air entry equal and Decreased, Clear to Auscultation, noCrackles, no wheezes Abdomen: Bowel Sound Present, Soft and Non tender Skin: no Rash Extremities: no Pedal edema, no calf tenderness Neurologic: Grossly no focal neuro deficit.  Labs on Admission:  CBC:  Recent Labs Lab 08/22/13 1235 08/25/13 2348 08/26/13 0400  WBC 19.1* 16.6* 18.1*  NEUTROABS 13.6* 10.9* 11.9*  HGB 8.3* 7.5* 7.6*  HCT 24.2* 20.9* 20.8*  MCV 92.0 90.5 90.4  PLT 553* 443* 441*    CMP     Component Value Date/Time   NA 140 08/26/2013 0400   K 3.6* 08/26/2013 0400   CL 104 08/26/2013 0400   CO2 22 08/26/2013 0400   GLUCOSE 121* 08/26/2013 0400   BUN 9 08/26/2013 0400   CREATININE 0.71 08/26/2013 0400   CREATININE 0.64 08/22/2013 1235   CALCIUM 9.0 08/26/2013 0400   PROT 7.1 08/26/2013 0400   ALBUMIN 3.9 08/26/2013 0400   AST 41* 08/26/2013 0400   ALT 21 08/26/2013 0400   ALKPHOS 89 08/26/2013 0400   BILITOT 7.6* 08/26/2013 0400   GFRNONAA >90 08/26/2013 0400   GFRAA >90 08/26/2013 0400    No results found for this basename: LIPASE, AMYLASE,  in the last 168  hours No results found for this basename: AMMONIA,  in the last 168 hours  No results found for this basename: CKTOTAL, CKMB, CKMBINDEX, TROPONINI,  in the last 168 hours BNP (last 3 results)  Recent Labs  11/13/12 1826  PROBNP 106.3    Radiological Exams on Admission: Dg Chest 2 View  08/25/2013   CLINICAL DATA:  Sickle cell crisis.  Shortness of  breath.  EXAM: CHEST  2 VIEW  COMPARISON:  08/13/2013 and 07/29/2013  FINDINGS: Power port in place. Chronic cardiomegaly. Pulmonary vascularity is within normal limits. There is slight chronic accentuation of the interstitial markings. No effusions. No acute osseous abnormality.  IMPRESSION: No acute findings. Chronic cardiomegaly. Chronic slight accentuation of the interstitial markings.   Electronically Signed   By: Rozetta Nunnery M.D.   On: 08/25/2013 23:55   Ct Angio Chest Pe W/cm &/or Wo Cm  08/26/2013   CLINICAL DATA:  Bilateral rib pain, worse on the left. History of sickle cell. White cell count 16.6.  EXAM: CT ANGIOGRAPHY CHEST WITH CONTRAST  TECHNIQUE: Multidetector CT imaging of the chest was performed using the standard protocol during bolus administration of intravenous contrast. Multiplanar CT image reconstructions and MIPs were obtained to evaluate the vascular anatomy.  CONTRAST:  167mL OMNIPAQUE IOHEXOL 350 MG/ML SOLN  COMPARISON:  06/17/2013  FINDINGS: Left central venous catheter remains unchanged in position. Technically adequate study with moderately good opacification of central and proximal segmental pulmonary arteries. Distal segmental vessels are not well demonstrated due to motion artifact. There is no focal filling defect in the visualized pulmonary arteries. No significant pulmonary embolus is suggested.  Cardiac enlargement. Normal caliber thoracic aorta. Moderately prominent lymph nodes in the anterior mediastinum without change since prior study. Esophagus is decompressed. Evaluation of lungs is limited due to respiratory  motion artifact but there appears to be patchy infiltration in the lung periphery bilaterally, more prominent in the bases. Scattered nodular changes are demonstrated similar previous study. Infiltrative changes are some of the prior study and may represent infiltration due to pneumonia, atelectasis, or acute chest syndrome. No pleural effusion or pneumothorax. Diffusely increased density and atrophy of the spleen consistent with sickle cell related changes. Thoracic vertebral changes consistent with sickle cell.  Review of the MIP images confirms the above findings.  IMPRESSION: No evidence of significant pulmonary embolus. Evaluation of lungs is limited due to respiratory motion artifact but there appears to be patchy peripheral infiltrative changes bilaterally, similar to prior study. Cardiac enlargement and prominent anterior mediastinal lymph nodes are also unchanged. Changes consistent with sickle cell effect in the spleen and spine.   Electronically Signed   By: Lucienne Capers M.D.   On: 08/26/2013 02:34     Assessment/Plan Principal Problem:   Acute sickle cell crisis Active Problems:   Hx of pulmonary embolus   Hypoxia   HTN (hypertension)   1. Acute sickle cell crisis Patient is presenting with complaints of bilateral rib pain. CT of the chest is negative for any pulmonary embolism. Patient was initially hypoxic in the ED. There is gradual improvement in his hypoxia. Patient will be admitted in the hospital for possible acute sickle cell crisis with the pain. I will place him on Dilaudid PCA as per his recent hospitalization. IV hydration. Continue his home medication. Hemoglobin appears on a lower side but not less than 7. We will hold off on any blood transfusion at present. Patient will be managed by sickle cell team in the morning.  2. History of pulmonary embolism. Patient does have history of pulmonary embolism CT of the chest was done to rule out any pulmonary embolism as that  can change the management. Continue with IV hydration. Continue with rivaroxaban.  3. Hypertension. Continue his home medication.  4. Hypoxia. Etiology likely secondary to atelectasis due to hip pain. CT scan is negative for any pneumonia. He has chronic bilateral patchy opacities. Next  I would continue to monitor.  DVT Prophylaxis:on chronic anticoagulation Nutrition: Regular diet  Code Status: Full  Disposition: Admitted to inpatient in telemetry unit.  Author: Berle Mull, MD [facility] Pager: [phone] 08/26/2013, 6:52 AM    If 7PM-7AM, please contact night-coverage [email or web address] Password [email or web address]  **Disclaimer: This note may have been dictated with voice recognition software. Similar sounding words can inadvertently be transcribed and this note may contain transcription errors which may not have been corrected upon publication of note.**

## 2013-08-27 LAB — CBC WITH DIFFERENTIAL/PLATELET
BASOS ABS: 0 10*3/uL (ref 0.0–0.1)
BASOS PCT: 0 % (ref 0–1)
EOS ABS: 0.4 10*3/uL (ref 0.0–0.7)
Eosinophils Relative: 2 % (ref 0–5)
HCT: 21.1 % — ABNORMAL LOW (ref 39.0–52.0)
Hemoglobin: 7.4 g/dL — ABNORMAL LOW (ref 13.0–17.0)
Lymphocytes Relative: 12 % (ref 12–46)
Lymphs Abs: 2.7 10*3/uL (ref 0.7–4.0)
MCH: 31.9 pg (ref 26.0–34.0)
MCHC: 35.1 g/dL (ref 30.0–36.0)
MCV: 90.9 fL (ref 78.0–100.0)
Monocytes Absolute: 2.9 10*3/uL — ABNORMAL HIGH (ref 0.1–1.0)
Monocytes Relative: 13 % — ABNORMAL HIGH (ref 3–12)
NEUTROS PCT: 73 % (ref 43–77)
Neutro Abs: 16.1 10*3/uL — ABNORMAL HIGH (ref 1.7–7.7)
Platelets: 453 10*3/uL — ABNORMAL HIGH (ref 150–400)
RBC: 2.32 MIL/uL — AB (ref 4.22–5.81)
RDW: 20.6 % — AB (ref 11.5–15.5)
WBC: 22.1 10*3/uL — ABNORMAL HIGH (ref 4.0–10.5)

## 2013-08-27 LAB — BASIC METABOLIC PANEL
ANION GAP: 15 (ref 5–15)
BUN: 8 mg/dL (ref 6–23)
CALCIUM: 9.2 mg/dL (ref 8.4–10.5)
CO2: 20 mEq/L (ref 19–32)
Chloride: 105 mEq/L (ref 96–112)
Creatinine, Ser: 0.59 mg/dL (ref 0.50–1.35)
Glucose, Bld: 136 mg/dL — ABNORMAL HIGH (ref 70–99)
Potassium: 3.8 mEq/L (ref 3.7–5.3)
SODIUM: 140 meq/L (ref 137–147)

## 2013-08-27 LAB — LACTATE DEHYDROGENASE: LDH: 560 U/L — ABNORMAL HIGH (ref 94–250)

## 2013-08-27 MED ORDER — MORPHINE SULFATE ER 30 MG PO TBCR
30.0000 mg | EXTENDED_RELEASE_TABLET | Freq: Two times a day (BID) | ORAL | Status: DC
  Administered 2013-08-27 – 2013-08-29 (×5): 30 mg via ORAL
  Filled 2013-08-27 (×5): qty 1

## 2013-08-27 MED ORDER — LEVOFLOXACIN 750 MG PO TABS
750.0000 mg | ORAL_TABLET | Freq: Every day | ORAL | Status: DC
  Administered 2013-08-27 – 2013-08-29 (×3): 750 mg via ORAL
  Filled 2013-08-27 (×3): qty 1

## 2013-08-27 MED ORDER — HYDROMORPHONE HCL PF 1 MG/ML IJ SOLN: 1.0000 mg | INTRAMUSCULAR | Status: DC | PRN

## 2013-08-27 MED ORDER — HYDROXYUREA 500 MG PO CAPS
1000.0000 mg | ORAL_CAPSULE | Freq: Every day | ORAL | Status: DC
  Administered 2013-08-27 – 2013-08-29 (×3): 1000 mg via ORAL
  Filled 2013-08-27 (×3): qty 2

## 2013-08-27 NOTE — Discharge Summary (Signed)
Physician Discharge Summary  Patient ID: [name] MRN: [id number] DOB/AGE: [birth year] 33 y.o.  Admit date: 06/26/2013 Discharge date: 08/27/2013  Admission Diagnoses:  Discharge Diagnoses:  Active Problems:   Hx of pulmonary embolus   Leukocytosis, unspecified   Hypokalemia   Sickle cell crisis   Sickle cell anemia   Acute chest syndrome   Discharged Condition: good  Hospital Course: Patient admitted with sickle cell painful crisis. Also diagnosed with acute chest syndrome due to worsening SOB. He was treated initially in the stepdown unit but later transitioned to [facility]. He responded to treatment and at the time of discharge, was much better. He had follow up scheduled with Dr Zigmund Daniel at [facility].  Consults: None  Significant Diagnostic Studies: labs: CBCs and CMPs and radiology: CXR: infiltrates: mid lobe bilaterally  Treatments: IV hydration, antibiotics: vancomycin, Zosyn and Levaquin, analgesia: Dilaudid and respiratory therapy: O2 and albuterol/atropine nebulizer  Discharge Exam: Blood pressure 118/68, pulse 71, temperature 98.1 F (36.7 C), temperature source Oral, resp. rate 19, height 6' (1.829 m), weight 72.757 kg (160 lb 6.4 oz), SpO2 97.00%. General appearance: alert, cooperative and no distress Eyes: conjunctivae/corneas clear. PERRL, EOM's intact. Fundi benign. Ears: normal TM's and external ear canals both ears Resp: clear to auscultation bilaterally Cardio: regular rate and rhythm, S1, S2 normal, no murmur, click, rub or gallop GI: soft, non-tender; bowel sounds normal; no masses,  no organomegaly Extremities: extremities normal, atraumatic, no cyanosis or edema Pulses: 2+ and symmetric Skin: Skin color, texture, turgor normal. No rashes or lesions Neurologic: Grossly normal  Disposition: 01-Home or Self Care     Medication List    ASK your doctor about these medications       hydroxyurea 500 MG capsule  Commonly known as:  HYDREA  Take 1,000 mg  by mouth daily. May take with food to minimize GI side effects.         SignedBarbette Merino 06/27/2013, 12:16 PM

## 2013-08-27 NOTE — Progress Notes (Signed)
SICKLE CELL SERVICE PROGRESS NOTE  [name] RUE:[id number] DOB: September 04, 1980 DOA: 08/25/2013 PCP: [name],[name], MD  Assessment/Plan: Principal Problem:   Acute sickle cell crisis Active Problems:   Hx of pulmonary embolus   Hypoxia   HTN (hypertension)  1. Hb SS with crisis: Pt states pain 8/10 but has not maximized the use of his PCA. I have discussed the possibility of increasing MS Contin as he has been having frequent breakthrough pain. Pt now admits that at times he has taken 30 mg instead of 15 mg at a time. I will increase MS Contin to 30 mg BID. Continue PCA at current dose. 34.63 mg with 57/46 demands/deliveries. Will continue Ketorolac. 2. Suspect Pneumonia vs Bronchitis: Pt has been having a productive cough of greenish sputum and has some Pleuritic pain. He has a recent CT that did not show any consolidation but he does have clinical symptoms consistent with Pneumonia vs bronchitis so will treat presumptively with Levaquin. Obtain CXR in the morning. 3. Leukocytosis: CBC pending. Likely related to crisis and possible Pneumonia.   Code Status: Full Code Family Communication: N/A Disposition Plan: Not yet ready for discharge  [place].  Pager [phone]. If 7PM-7AM, please contact night-coverage.  08/27/2013, 10:45 AM  LOS: 2 days   Consultants:  None  Procedures:  None  Antibiotics:  Levaquin 7/28 >>  HPI/Subjective: Pt reports pain in left chest wall which is pleuritic in nature and is rated as 8/10. Last BM 2 days ago.  Objective: Filed Vitals:   08/27/13 0129 08/27/13 0306 08/27/13 0626 08/27/13 0642  BP: 133/98  142/79   Pulse: 82  97   Temp: 98.5 F (36.9 C)  98.3 F (36.8 C)   TempSrc: Oral  Oral   Resp: 16 16 17 15   Height:      Weight:   161 lb 6 oz (73.2 kg)   SpO2: 91% 90% 91% 90%   Weight change: -1 lb 10 oz (-0.736 kg)  Intake/Output Summary (Last 24 hours) at 08/27/13 1045 Last data filed at 08/27/13 1009  Gross  per 24 hour  Intake 1827.8 ml  Output   1350 ml  Net  477.8 ml    General: Alert, awake, oriented x3, in no acute distress.  HEENT: /AT PEERL, EOMI, Anicteric. Neck: Trachea midline,  no masses, no thyromegal,y no JVD, no carotid bruit OROPHARYNX:  Moist, No exudate/ erythema/lesions.  Heart: Regular rate and rhythm, without murmurs, rubs, gallops. Lungs: Clear to auscultation, no wheezing or rhonchi noted. Dullness to percussion LLL. Abdomen: Soft, nontender, nondistended, positive bowel sounds, no masses no hepatosplenomegaly noted..  Neuro: No focal neurological deficits noted cranial nerves II through XII grossly intact. DTRs 2+ bilaterally upper and lower extremities. Strength 5 out of 5 in bilateral upper and lower extremities. Musculoskeletal: No warm swelling or erythema around joints, no spinal tenderness noted. Psychiatric: Patient alert and oriented x3, good insight and cognition, good recent to remote recall.   Data Reviewed: Basic Metabolic Panel:  Recent Labs Lab 08/22/13 1235 08/25/13 2348 08/26/13 0400  NA 138 139 140  K 3.9 3.8 3.6*  CL 106 104 104  CO2 22 20 22   GLUCOSE 97 90 121*  BUN 8 9 9   CREATININE 0.64 0.56 0.71  CALCIUM 9.1 8.9 9.0  MG  --   --  1.7   Liver Function Tests:  Recent Labs Lab 08/22/13 1235 08/26/13 0400  AST 33 41*  ALT 20 21  ALKPHOS 86 89  BILITOT  7.7* 7.6*  PROT 7.5 7.1  ALBUMIN 4.2 3.9   No results found for this basename: LIPASE, AMYLASE,  in the last 168 hours No results found for this basename: AMMONIA,  in the last 168 hours CBC:  Recent Labs Lab 08/22/13 1235 08/25/13 2348 08/26/13 0400  WBC 19.1* 16.6* 18.1*  NEUTROABS 13.6* 10.9* 11.9*  HGB 8.3* 7.5* 7.6*  HCT 24.2* 20.9* 20.8*  MCV 92.0 90.5 90.4  PLT 553* 443* 441*   Cardiac Enzymes: No results found for this basename: CKTOTAL, CKMB, CKMBINDEX, TROPONINI,  in the last 168 hours BNP (last 3 results)  Recent Labs  11/13/12 1826  PROBNP 106.3    CBG: No results found for this basename: GLUCAP,  in the last 168 hours  Recent Results (from the past 240 hour(s))  MRSA PCR SCREENING     Status: None   Collection Time    08/26/13  3:04 AM      Result Value Ref Range Status   MRSA by PCR NEGATIVE  NEGATIVE Final   Comment:            The GeneXpert MRSA Assay (FDA     approved for NASAL specimens     only), is one component of a     comprehensive MRSA colonization     surveillance program. It is not     intended to diagnose MRSA     infection nor to guide or     monitor treatment for     MRSA infections.     Studies: Dg Chest 2 View  08/25/2013   CLINICAL DATA:  Sickle cell crisis.  Shortness of breath.  EXAM: CHEST  2 VIEW  COMPARISON:  08/13/2013 and 07/29/2013  FINDINGS: Power port in place. Chronic cardiomegaly. Pulmonary vascularity is within normal limits. There is slight chronic accentuation of the interstitial markings. No effusions. No acute osseous abnormality.  IMPRESSION: No acute findings. Chronic cardiomegaly. Chronic slight accentuation of the interstitial markings.   Electronically Signed   By: Rozetta Nunnery M.D.   On: 08/25/2013 23:55   Dg Chest 2 View  08/13/2013   CLINICAL DATA:  Sickle-cell, chest pain  EXAM: CHEST  2 VIEW  COMPARISON:  07/29/2013  FINDINGS: No pleural effusion or pneumothorax. No focal consolidation. Mild interstitial thickening. Stable cardiomegaly. Left-sided Port-A-Cath in satisfactory position. Prominence of the central pulmonary vasculature. Unremarkable osseous structures.  IMPRESSION: No active cardiopulmonary disease.  Cardiomegaly.   Electronically Signed   By: Kathreen Devoid   On: 08/13/2013 11:35   Dg Chest 2 View  07/29/2013   CLINICAL DATA:  Sickle cell crisis with chest pain and fever  EXAM: CHEST  2 VIEW  COMPARISON:  PA and lateral chest of Jun 26, 2013  FINDINGS: The lungs remain well-expanded. The interstitial markings are mildly increased bilaterally but less conspicuous than on  the earlier study. There is no alveolar pneumonia. The cardiac silhouette remains enlarged. The central pulmonary vascularity is prominent but stable. The Port-A-Cath appliance tip lies in the region of the mid to distal SVC. The bony thorax is unremarkable.  IMPRESSION: There is no pneumonia. There is stable enlargement of the cardiac silhouette. Mild prominence of the interstitial markings has improved since the previous study.   Electronically Signed   By: David  Martinique   On: 07/29/2013 12:11   Ct Angio Chest Pe W/cm &/or Wo Cm  08/26/2013   CLINICAL DATA:  Bilateral rib pain, worse on the left. History of sickle cell. White  cell count 16.6.  EXAM: CT ANGIOGRAPHY CHEST WITH CONTRAST  TECHNIQUE: Multidetector CT imaging of the chest was performed using the standard protocol during bolus administration of intravenous contrast. Multiplanar CT image reconstructions and MIPs were obtained to evaluate the vascular anatomy.  CONTRAST:  13mL OMNIPAQUE IOHEXOL 350 MG/ML SOLN  COMPARISON:  06/17/2013  FINDINGS: Left central venous catheter remains unchanged in position. Technically adequate study with moderately good opacification of central and proximal segmental pulmonary arteries. Distal segmental vessels are not well demonstrated due to motion artifact. There is no focal filling defect in the visualized pulmonary arteries. No significant pulmonary embolus is suggested.  Cardiac enlargement. Normal caliber thoracic aorta. Moderately prominent lymph nodes in the anterior mediastinum without change since prior study. Esophagus is decompressed. Evaluation of lungs is limited due to respiratory motion artifact but there appears to be patchy infiltration in the lung periphery bilaterally, more prominent in the bases. Scattered nodular changes are demonstrated similar previous study. Infiltrative changes are some of the prior study and may represent infiltration due to pneumonia, atelectasis, or acute chest syndrome. No  pleural effusion or pneumothorax. Diffusely increased density and atrophy of the spleen consistent with sickle cell related changes. Thoracic vertebral changes consistent with sickle cell.  Review of the MIP images confirms the above findings.  IMPRESSION: No evidence of significant pulmonary embolus. Evaluation of lungs is limited due to respiratory motion artifact but there appears to be patchy peripheral infiltrative changes bilaterally, similar to prior study. Cardiac enlargement and prominent anterior mediastinal lymph nodes are also unchanged. Changes consistent with sickle cell effect in the spleen and spine.   Electronically Signed   By: Lucienne Capers M.D.   On: 08/26/2013 02:34    Scheduled Meds: . antiseptic oral rinse  15 mL Mouth Rinse BID  . famotidine  20 mg Oral Q12H   Or  . famotidine (PEPCID) IV  20 mg Intravenous Q12H  . folic acid  1 mg Oral q morning - 10a  . HYDROmorphone PCA 0.3 mg/mL   Intravenous 6 times per day  . ketorolac  30 mg Intravenous Q6H  . lisinopril  10 mg Oral Daily  . morphine  30 mg Oral BID  . rivaroxaban  20 mg Oral Daily   Continuous Infusions: . dextrose 5 % and 0.45% NaCl 100 mL/hr at 08/27/13 1021    Total time 40 minutes.

## 2013-08-28 ENCOUNTER — Inpatient Hospital Stay (HOSPITAL_COMMUNITY): Payer: Medicare Other

## 2013-08-28 DIAGNOSIS — D5700 Hb-SS disease with crisis, unspecified: Secondary | ICD-10-CM | POA: Diagnosis not present

## 2013-08-28 DIAGNOSIS — M25539 Pain in unspecified wrist: Secondary | ICD-10-CM | POA: Diagnosis not present

## 2013-08-28 MED ORDER — HYDROMORPHONE 0.3 MG/ML IV SOLN
INTRAVENOUS | Status: DC
  Administered 2013-08-28: 6.39 mg via INTRAVENOUS
  Administered 2013-08-28: 4.3 mg via INTRAVENOUS
  Administered 2013-08-28: 13:00:00 via INTRAVENOUS
  Administered 2013-08-28: 11.46 mg via INTRAVENOUS
  Administered 2013-08-29: via INTRAVENOUS
  Administered 2013-08-29: 4.2 mg via INTRAVENOUS
  Administered 2013-08-29: 1.8 mg via INTRAVENOUS
  Administered 2013-08-29: 08:00:00 via INTRAVENOUS
  Filled 2013-08-28 (×4): qty 25

## 2013-08-28 NOTE — Progress Notes (Signed)
Subjective: Patient admitted with sickle cell painful crisis. Has been on Dilaudid PCA and has used 33.04 mg of dilaudid in the last 24 hours but still has pain at 8/10. No fever, no NVD. Has been doing well however with his breathing. He still has some productive cough.  Objective: Vital signs in last 24 hours: Temp:  [98.3 F (36.8 C)-98.6 F (37 C)] 98.6 F (37 C) (07/29 0547) Pulse Rate:  [78-95] 78 (07/29 0547) Resp:  [15-20] 20 (07/29 0846) BP: (132-137)/(64-92) 132/82 mmHg (07/29 0547) SpO2:  [90 %-98 %] 96 % (07/29 0846) Weight change:  Last BM Date: 08/27/13  Intake/Output from previous day: 07/28 0701 - 07/29 0700 In: 1560 [P.O.:1560] Out: 1325 [Urine:1325] Intake/Output this shift:    General appearance: alert Eyes: conjunctivae/corneas clear. PERRL, EOM's intact. Fundi benign. Neck: no adenopathy, no carotid bruit, no JVD, supple, symmetrical, trachea midline and thyroid not enlarged, symmetric, no tenderness/mass/nodules Back: symmetric, no curvature. ROM normal. No CVA tenderness. Resp: clear to auscultation bilaterally Chest wall: no tenderness Cardio: regular rate and rhythm, S1, S2 normal, no murmur, click, rub or gallop GI: soft, non-tender; bowel sounds normal; no masses,  no organomegaly Extremities: extremities normal, atraumatic, no cyanosis or edema Pulses: 2+ and symmetric Skin: Skin color, texture, turgor normal. No rashes or lesions Neurologic: Grossly normal  Lab Results:  Recent Labs  08/26/13 0400 08/27/13 1043  WBC 18.1* 22.1*  HGB 7.6* 7.4*  HCT 20.8* 21.1*  PLT 441* 453*   BMET  Recent Labs  08/26/13 0400 08/27/13 1043  NA 140 140  K 3.6* 3.8  CL 104 105  CO2 22 20  GLUCOSE 121* 136*  BUN 9 8  CREATININE 0.71 0.59  CALCIUM 9.0 9.2    Studies/Results: No results found.  Medications: I have reviewed the patient's current medications.  Assessment/Plan: Patient admitted with sickle cell painful crisis. Also has Acute  Bronchitis.  #1 Sickle cell Painful crisis: patient has improved. Will continue current regimen of Dilaudid with decreased locout interval to 8 minutes. If doing better, will Dc in am.  #2 Acute Bronchitis: Recurrent respiratory illness in the last year. Will continue Nebs and Levaquin.  #3 History of VTE: continue Xarelto.  #4 Depression/Anxiety: counseled.   LOS: 3 days   Rollyn Scialdone,[name] 08/28/2013, 10:32 AM

## 2013-08-29 DIAGNOSIS — M8700 Idiopathic aseptic necrosis of unspecified bone: Secondary | ICD-10-CM

## 2013-08-29 LAB — COMPREHENSIVE METABOLIC PANEL
ALT: 24 U/L (ref 0–53)
AST: 42 U/L — ABNORMAL HIGH (ref 0–37)
Albumin: 4 g/dL (ref 3.5–5.2)
Alkaline Phosphatase: 105 U/L (ref 39–117)
Anion gap: 13 (ref 5–15)
BUN: 9 mg/dL (ref 6–23)
CALCIUM: 9.1 mg/dL (ref 8.4–10.5)
CHLORIDE: 104 meq/L (ref 96–112)
CO2: 21 meq/L (ref 19–32)
Creatinine, Ser: 0.56 mg/dL (ref 0.50–1.35)
GLUCOSE: 110 mg/dL — AB (ref 70–99)
Potassium: 4 mEq/L (ref 3.7–5.3)
SODIUM: 138 meq/L (ref 137–147)
Total Bilirubin: 8.4 mg/dL — ABNORMAL HIGH (ref 0.3–1.2)
Total Protein: 7.4 g/dL (ref 6.0–8.3)

## 2013-08-29 LAB — CBC WITH DIFFERENTIAL/PLATELET
BASOS PCT: 0 % (ref 0–1)
Basophils Absolute: 0 10*3/uL (ref 0.0–0.1)
EOS ABS: 0 10*3/uL (ref 0.0–0.7)
Eosinophils Relative: 0 % (ref 0–5)
HEMATOCRIT: 19.1 % — AB (ref 39.0–52.0)
Hemoglobin: 6.9 g/dL — CL (ref 13.0–17.0)
LYMPHS ABS: 2.3 10*3/uL (ref 0.7–4.0)
Lymphocytes Relative: 12 % (ref 12–46)
MCH: 31.8 pg (ref 26.0–34.0)
MCHC: 36.1 g/dL — AB (ref 30.0–36.0)
MCV: 88 fL (ref 78.0–100.0)
Monocytes Absolute: 1.1 10*3/uL — ABNORMAL HIGH (ref 0.1–1.0)
Monocytes Relative: 6 % (ref 3–12)
NEUTROS ABS: 15.4 10*3/uL — AB (ref 1.7–7.7)
Neutrophils Relative %: 82 % — ABNORMAL HIGH (ref 43–77)
Platelets: 468 10*3/uL — ABNORMAL HIGH (ref 150–400)
RBC: 2.17 MIL/uL — ABNORMAL LOW (ref 4.22–5.81)
RDW: 20.4 % — ABNORMAL HIGH (ref 11.5–15.5)
WBC: 18.8 10*3/uL — ABNORMAL HIGH (ref 4.0–10.5)

## 2013-08-29 LAB — EXPECTORATED SPUTUM ASSESSMENT W GRAM STAIN, RFLX TO RESP C: Special Requests: NORMAL

## 2013-08-29 LAB — EXPECTORATED SPUTUM ASSESSMENT W REFEX TO RESP CULTURE

## 2013-08-29 MED ORDER — HEPARIN SOD (PORK) LOCK FLUSH 100 UNIT/ML IV SOLN
INTRAVENOUS | Status: AC
  Administered 2013-08-29: 500 [IU]
  Filled 2013-08-29: qty 5

## 2013-08-29 MED ORDER — HEPARIN SOD (PORK) LOCK FLUSH 100 UNIT/ML IV SOLN
500.0000 [IU] | INTRAVENOUS | Status: AC | PRN
  Administered 2013-08-29: 500 [IU]

## 2013-08-29 MED ORDER — HYDROXYUREA 500 MG PO CAPS: 1000.0000 mg | ORAL_CAPSULE | Freq: Every day | ORAL | Status: DC

## 2013-08-29 MED ORDER — LEVOFLOXACIN 750 MG PO TABS: 750.0000 mg | ORAL_TABLET | Freq: Every day | ORAL | Status: DC

## 2013-08-29 NOTE — Progress Notes (Signed)
Nursing Discharge Summary  Patient ID: [name] MRN: [id number] DOB/AGE: 1980/03/28 33 y.o.  Admit date: 08/25/2013 Discharge date: 08/29/2013  Discharged Condition: good  Disposition: 01-Home or Self Care    Prescriptions Given: Prescription for Levaquin given. Hydroxyurea called into pharmacy. Patient verbalized understanding of instructions without further questions.  Means of Discharge: Patient to ambulate downstairs to be discharged home via private vehicle.   Signed: Buel Ream 08/29/2013, 12:09 PM

## 2013-08-29 NOTE — Discharge Summary (Signed)
Physician Discharge Summary  Patient ID: [name] MRN: [id number] DOB/AGE: 06-02-1980 33 y.o.  Admit date: 08/25/2013 Discharge date: 08/29/2013  Admission Diagnoses:  Discharge Diagnoses:  Principal Problem:   Acute sickle cell crisis Active Problems:   Hx of pulmonary embolus   Hypoxia   HTN (hypertension)   Discharged Condition: good  Hospital Course: Patient admitted with sickle cell Painful crisis and Bronchitis. He was treated with IV Dilaudid PCA, Toradol as well as Levaquin for Bronchitis. Patient did very well. No new complaint at time of discharge and he was at his baseline. He will complete antibiotics and follow up with Dr Zigmund Daniel in the clinic.  Consults: None  Significant Diagnostic Studies: labs: CBCs and CMPs serially. Mostly stable.  Treatments: IV hydration, antibiotics: Levaquin and analgesia: Dilaudid  Discharge Exam: Blood pressure 130/75, pulse 77, temperature 97.5 F (36.4 C), temperature source Oral, resp. rate 15, height 6' (1.829 m), weight 77.4 kg (170 lb 10.2 oz), SpO2 97.00%. General appearance: alert, cooperative and no distress Eyes: conjunctivae/corneas clear. PERRL, EOM's intact. Fundi benign. Neck: no adenopathy, no carotid bruit, no JVD, supple, symmetrical, trachea midline and thyroid not enlarged, symmetric, no tenderness/mass/nodules Back: symmetric, no curvature. ROM normal. No CVA tenderness. Resp: clear to auscultation bilaterally Chest wall: no tenderness Cardio: regular rate and rhythm, S1, S2 normal, no murmur, click, rub or gallop GI: soft, non-tender; bowel sounds normal; no masses,  no organomegaly Extremities: extremities normal, atraumatic, no cyanosis or edema Pulses: 2+ and symmetric Skin: Skin color, texture, turgor normal. No rashes or lesions Neurologic: Grossly normal  Disposition: 01-Home or Self Care     Medication List         celecoxib 200 MG capsule  Commonly known as:  CELEBREX  Take 1 capsule  (200 mg total) by mouth 2 (two) times daily.     folic acid 1 MG tablet  Commonly known as:  FOLVITE  Take 1 tablet (1 mg total) by mouth every morning.     HYDROmorphone 4 MG tablet  Commonly known as:  DILAUDID  Take 1 tablet (4 mg total) by mouth every 4 (four) hours as needed for severe pain.     hydroxyurea 500 MG capsule  Commonly known as:  HYDREA  Take 2 capsules (1,000 mg total) by mouth daily. May take with food to minimize GI side effects.     levofloxacin 750 MG tablet  Commonly known as:  LEVAQUIN  Take 1 tablet (750 mg total) by mouth daily.     lisinopril 10 MG tablet  Commonly known as:  PRINIVIL,ZESTRIL  Take 1 tablet (10 mg total) by mouth daily.     metoprolol tartrate 25 MG tablet  Commonly known as:  LOPRESSOR  Take 1 tablet (25 mg total) by mouth daily.     morphine 15 MG 12 hr tablet  Commonly known as:  MS CONTIN  Take 1 tablet (15 mg total) by mouth 2 (two) times daily.     potassium chloride SA 20 MEQ tablet  Commonly known as:  K-DUR,KLOR-CON  Take 1 tablet (20 mEq total) by mouth every morning.     rivaroxaban 20 MG Tabs tablet  Commonly known as:  XARELTO  Take 1 tablet (20 mg total) by mouth every morning.     zolpidem 10 MG tablet  Commonly known as:  AMBIEN  Take 1 tablet (10 mg total) by mouth at bedtime as needed for sleep.         SignedBarbette Merino  08/29/2013, 11:45 AM

## 2013-09-03 ENCOUNTER — Encounter (HOSPITAL_COMMUNITY): Payer: Self-pay | Admitting: *Deleted

## 2013-09-03 ENCOUNTER — Telehealth (HOSPITAL_COMMUNITY): Payer: Self-pay | Admitting: Hematology

## 2013-09-03 ENCOUNTER — Non-Acute Institutional Stay (HOSPITAL_COMMUNITY)
Admission: AD | Admit: 2013-09-03 | Discharge: 2013-09-03 | Disposition: A | Discharge status: Home / Self Care | Payer: Medicare Other | Source: Ambulatory Visit | Attending: Internal Medicine | Admitting: Internal Medicine

## 2013-09-03 DIAGNOSIS — D473 Essential (hemorrhagic) thrombocythemia: Secondary | ICD-10-CM | POA: Diagnosis not present

## 2013-09-03 DIAGNOSIS — Z7901 Long term (current) use of anticoagulants: Secondary | ICD-10-CM | POA: Insufficient documentation

## 2013-09-03 DIAGNOSIS — F1210 Cannabis abuse, uncomplicated: Secondary | ICD-10-CM | POA: Insufficient documentation

## 2013-09-03 DIAGNOSIS — D571 Sickle-cell disease without crisis: Secondary | ICD-10-CM | POA: Diagnosis not present

## 2013-09-03 DIAGNOSIS — D72829 Elevated white blood cell count, unspecified: Secondary | ICD-10-CM | POA: Diagnosis not present

## 2013-09-03 DIAGNOSIS — D5700 Hb-SS disease with crisis, unspecified: Secondary | ICD-10-CM | POA: Insufficient documentation

## 2013-09-03 DIAGNOSIS — Z885 Allergy status to narcotic agent status: Secondary | ICD-10-CM | POA: Diagnosis not present

## 2013-09-03 DIAGNOSIS — Z87891 Personal history of nicotine dependence: Secondary | ICD-10-CM | POA: Insufficient documentation

## 2013-09-03 DIAGNOSIS — F39 Unspecified mood [affective] disorder: Secondary | ICD-10-CM | POA: Insufficient documentation

## 2013-09-03 DIAGNOSIS — M8700 Idiopathic aseptic necrosis of unspecified bone: Secondary | ICD-10-CM | POA: Diagnosis not present

## 2013-09-03 LAB — CBC WITH DIFFERENTIAL/PLATELET
BASOS ABS: 0 10*3/uL (ref 0.0–0.1)
Basophils Relative: 0 % (ref 0–1)
Eosinophils Absolute: 0.4 10*3/uL (ref 0.0–0.7)
Eosinophils Relative: 3 % (ref 0–5)
HEMATOCRIT: 19.8 % — AB (ref 39.0–52.0)
Hemoglobin: 6.9 g/dL — CL (ref 13.0–17.0)
LYMPHS ABS: 1.8 10*3/uL (ref 0.7–4.0)
Lymphocytes Relative: 13 % (ref 12–46)
MCH: 32.4 pg (ref 26.0–34.0)
MCHC: 34.8 g/dL (ref 30.0–36.0)
MCV: 93 fL (ref 78.0–100.0)
Monocytes Absolute: 1.8 10*3/uL — ABNORMAL HIGH (ref 0.1–1.0)
Monocytes Relative: 13 % — ABNORMAL HIGH (ref 3–12)
NRBC: 3 /100{WBCs} — AB
Neutro Abs: 10.1 10*3/uL — ABNORMAL HIGH (ref 1.7–7.7)
Neutrophils Relative %: 71 % (ref 43–77)
PLATELETS: 564 10*3/uL — AB (ref 150–400)
RBC: 2.13 MIL/uL — ABNORMAL LOW (ref 4.22–5.81)
RDW: 23.5 % — AB (ref 11.5–15.5)
WBC: 14.1 10*3/uL — ABNORMAL HIGH (ref 4.0–10.5)

## 2013-09-03 LAB — RETICULOCYTES
RBC.: 2.13 MIL/uL — AB (ref 4.22–5.81)
Retic Ct Pct: >23 % — ABNORMAL HIGH (ref 0.4–3.1)

## 2013-09-03 MED ORDER — SODIUM CHLORIDE 0.9 % IJ SOLN
9.0000 mL | INTRAMUSCULAR | Status: DC | PRN
Start: 2013-09-03 — End: 2013-09-03

## 2013-09-03 MED ORDER — DIPHENHYDRAMINE HCL 50 MG/ML IJ SOLN
12.5000 mg | Freq: Once | INTRAMUSCULAR | Status: AC
  Administered 2013-09-03: 12.5 mg via INTRAVENOUS
  Filled 2013-09-03: qty 1

## 2013-09-03 MED ORDER — KETOROLAC TROMETHAMINE 30 MG/ML IJ SOLN
30.0000 mg | Freq: Four times a day (QID) | INTRAMUSCULAR | Status: DC
  Administered 2013-09-03 (×2): 30 mg via INTRAVENOUS
  Filled 2013-09-03 (×2): qty 1

## 2013-09-03 MED ORDER — HEPARIN SOD (PORK) LOCK FLUSH 100 UNIT/ML IV SOLN
500.0000 [IU] | INTRAVENOUS | Status: AC | PRN
  Administered 2013-09-03: 500 [IU]
  Filled 2013-09-03: qty 5

## 2013-09-03 MED ORDER — NALOXONE HCL 0.4 MG/ML IJ SOLN: 0.4000 mg | INTRAMUSCULAR | Status: DC | PRN

## 2013-09-03 MED ORDER — SODIUM CHLORIDE 0.9 % IJ SOLN
10.0000 mL | INTRAMUSCULAR | Status: AC | PRN
  Administered 2013-09-03: 10 mL

## 2013-09-03 MED ORDER — METOPROLOL TARTRATE 25 MG PO TABS: 25.0000 mg | ORAL_TABLET | Freq: Every day | ORAL | Status: DC

## 2013-09-03 MED ORDER — HYDROMORPHONE HCL 4 MG PO TABS
4.0000 mg | ORAL_TABLET | Freq: Once | ORAL | Status: AC
Start: 2013-09-03 — End: 2013-09-03
  Administered 2013-09-03: 4 mg via ORAL
  Filled 2013-09-03: qty 1

## 2013-09-03 MED ORDER — DEXTROSE-NACL 5-0.45 % IV SOLN
INTRAVENOUS | Status: DC
  Administered 2013-09-03: 10:00:00 via INTRAVENOUS

## 2013-09-03 MED ORDER — DIPHENHYDRAMINE HCL 25 MG PO CAPS: 25.0000 mg | ORAL_CAPSULE | ORAL | Status: DC | PRN

## 2013-09-03 MED ORDER — SODIUM CHLORIDE 0.9 % IJ SOLN
INTRAMUSCULAR | Status: AC
  Administered 2013-09-03: 10 mL
  Filled 2013-09-03: qty 10

## 2013-09-03 MED ORDER — HYDROMORPHONE HCL PF 2 MG/ML IJ SOLN
2.0000 mg | Freq: Once | INTRAMUSCULAR | Status: AC
  Administered 2013-09-03: 2 mg via INTRAVENOUS
  Filled 2013-09-03: qty 1

## 2013-09-03 MED ORDER — HYDROMORPHONE HCL PF 2 MG/ML IJ SOLN
2.5000 mg | Freq: Once | INTRAMUSCULAR | Status: AC
  Administered 2013-09-03: 2.5 mg via INTRAVENOUS
  Filled 2013-09-03: qty 2

## 2013-09-03 MED ORDER — ONDANSETRON HCL 4 MG/2ML IJ SOLN: 4.0000 mg | Freq: Four times a day (QID) | INTRAMUSCULAR | Status: DC | PRN

## 2013-09-03 MED ORDER — MORPHINE SULFATE ER 30 MG PO TBCR
30.0000 mg | EXTENDED_RELEASE_TABLET | Freq: Once | ORAL | Status: AC
  Administered 2013-09-03: 30 mg via ORAL
  Filled 2013-09-03: qty 1

## 2013-09-03 MED ORDER — MORPHINE SULFATE ER 30 MG PO TBCR: 30.0000 mg | EXTENDED_RELEASE_TABLET | Freq: Two times a day (BID) | ORAL | Status: DC

## 2013-09-03 MED ORDER — HYDROMORPHONE 0.3 MG/ML IV SOLN
INTRAVENOUS | Status: DC
  Administered 2013-09-03: 12:00:00 via INTRAVENOUS
  Administered 2013-09-03: 7.19 mg via INTRAVENOUS
  Administered 2013-09-03: 3.19 mg via INTRAVENOUS
  Administered 2013-09-03: 3.86 mg via INTRAVENOUS
  Administered 2013-09-03: 16:00:00 via INTRAVENOUS
  Filled 2013-09-03 (×2): qty 25

## 2013-09-03 NOTE — Discharge Summary (Signed)
Physician Discharge Summary  [name] [name] DOB: Aug 29, 1980 DOA: 09/03/2013  PCP: Srinika Delone A., MD  Admit date: 09/03/2013 Discharge date: 09/03/2013  Discharge Diagnoses:  Active Problems:   Hb SS with crisis   Discharge Condition: stable  Disposition: Home  Diet:Heart Healthy Wt Readings from Last 3 Encounters:  09/03/13 163 lb (73.936 kg)  08/29/13 170 lb 10.2 oz (77.4 kg)  08/22/13 166 lb (75.297 kg)    Hospital Course:  Patient admitted to the day infusion center for extended observation. Treatment plan was outlined by Dr. Zigmund Daniel.  He was started on hypotonic IVFs for cellular re-hydration. Patient is currently tolerating oral fluids. He was given Toradol and IV dilaudid per weight based rapid re-dosing. Pain intensity remained elevated, he was transitioned to PCA dilaudid. Mr. Vu used a total of  14.24 mg with 21 demands and 18  Deliveries. Reviewed labs which are consistent with previous values. Rx given for Morphine 30 mg every 12 hours #60. Patient alert, oriented and ambulatory. He states that he can manage at home on medication regimen. Patient to follow up in the office as scheduled.   Discharge Exam:  Filed Vitals:   09/03/13 1614  BP: 119/67  Pulse: 73  Temp: 98.6 F (37 C)  Resp: 16   Filed Vitals:   09/03/13 1400 09/03/13 1500 09/03/13 1548 09/03/13 1614  BP: 110/59 111/62  119/67  Pulse: 65 72  73  Temp: 99.6 F (37.6 C) 98.8 F (37.1 C)  98.6 F (37 C)  TempSrc: Oral Oral  Oral  Resp: 22 18 21 16   Height:      Weight:      SpO2: 96% 96% 95% 95%     General: Alert, awake, oriented x3, in no acute distress, appears comfortable.  HEENT: [place]/AT PEERL, EOMI Neck: Trachea midline,  no masses, no thyromegal,y no JVD, no carotid bruit OROPHARYNX:  Moist, No exudate/ erythema/lesions.  Heart: Regular rate and rhythm, without murmurs, rubs, gallops, PMI non-displaced, no heaves or thrills on palpation.  Lungs: Clear to  auscultation, no wheezing or rhonchi noted. No increased vocal fremitus resonant to percussion  Abdomen: Soft, nontender, nondistended, positive bowel sounds, no masses no hepatosplenomegaly noted..  Neuro: No focal neurological deficits noted cranial nerves II through XII grossly intact. DTRs 2+ bilaterally upper and lower extremities. Strength 5 out of 5 in bilateral upper and lower extremities. Musculoskeletal: No warm swelling or erythema around joints, no spinal tenderness noted. Psychiatric: Patient alert and oriented x3, good insight and cognition, good recent to remote recall. Lymph node survey: No cervical axillary or inguinal lymphadenopathy noted.   Discharge Instructions     Medication List    TAKE these medications       morphine 30 MG 12 hr tablet  Commonly known as:  MS CONTIN  Take 1 tablet (30 mg total) by mouth every 12 (twelve) hours.      ASK your doctor about these medications       celecoxib 200 MG capsule  Commonly known as:  CELEBREX  Take 1 capsule (200 mg total) by mouth 2 (two) times daily.     folic acid 1 MG tablet  Commonly known as:  FOLVITE  Take 1 tablet (1 mg total) by mouth every morning.     HYDROmorphone 4 MG tablet  Commonly known as:  DILAUDID  Take 1 tablet (4 mg total) by mouth every 4 (four) hours as needed for severe pain.     hydroxyurea 500 MG  capsule  Commonly known as:  HYDREA  Take 2 capsules (1,000 mg total) by mouth daily. May take with food to minimize GI side effects.     levofloxacin 750 MG tablet  Commonly known as:  LEVAQUIN  Take 1 tablet (750 mg total) by mouth daily.     lisinopril 10 MG tablet  Commonly known as:  PRINIVIL,ZESTRIL  Take 1 tablet (10 mg total) by mouth daily.     metoprolol tartrate 25 MG tablet  Commonly known as:  LOPRESSOR  Take 1 tablet (25 mg total) by mouth daily.     potassium chloride SA 20 MEQ tablet  Commonly known as:  K-DUR,KLOR-CON  Take 1 tablet (20 mEq total) by mouth every  morning.     rivaroxaban 20 MG Tabs tablet  Commonly known as:  XARELTO  Take 1 tablet (20 mg total) by mouth every morning.     zolpidem 10 MG tablet  Commonly known as:  AMBIEN  Take 1 tablet (10 mg total) by mouth at bedtime as needed for sleep.          The results of significant diagnostics from this hospitalization (including imaging, microbiology, ancillary and laboratory) are listed below for reference.    Significant Diagnostic Studies: Dg Chest 2 View  08/28/2013   CLINICAL DATA:  Shortness of breath.  History of sickle cell anemia.  EXAM: CHEST  2 VIEW  COMPARISON:  CT 08/26/2013.  Radiographs 08/25/2013.  FINDINGS: The left subclavian Port-A-Cath tip is unchanged at the SVC right atrial junction. There is stable cardiomegaly. Generalized accentuation of the interstitial markings and patchy bibasilar opacities are unchanged. There is no progressive consolidation or significant pleural effusion. There are stable endplate changes within the thoracic spine consistent with sickle cell anemia.  IMPRESSION: Radiographically stable chest with cardiomegaly, accentuated interstitial markings and patchy bibasilar pulmonary opacities. No new findings demonstrated.   Electronically Signed   By: Camie Patience M.D.   On: 08/28/2013 14:12   Dg Chest 2 View  08/25/2013   CLINICAL DATA:  Sickle cell crisis.  Shortness of breath.  EXAM: CHEST  2 VIEW  COMPARISON:  08/13/2013 and 07/29/2013  FINDINGS: Power port in place. Chronic cardiomegaly. Pulmonary vascularity is within normal limits. There is slight chronic accentuation of the interstitial markings. No effusions. No acute osseous abnormality.  IMPRESSION: No acute findings. Chronic cardiomegaly. Chronic slight accentuation of the interstitial markings.   Electronically Signed   By: Rozetta Nunnery M.D.   On: 08/25/2013 23:55   Dg Chest 2 View  08/13/2013   CLINICAL DATA:  Sickle-cell, chest pain  EXAM: CHEST  2 VIEW  COMPARISON:  07/29/2013   FINDINGS: No pleural effusion or pneumothorax. No focal consolidation. Mild interstitial thickening. Stable cardiomegaly. Left-sided Port-A-Cath in satisfactory position. Prominence of the central pulmonary vasculature. Unremarkable osseous structures.  IMPRESSION: No active cardiopulmonary disease.  Cardiomegaly.   Electronically Signed   By: Kathreen Devoid   On: 08/13/2013 11:35   Ct Angio Chest Pe W/cm &/or Wo Cm  08/26/2013   CLINICAL DATA:  Bilateral rib pain, worse on the left. History of sickle cell. White cell count 16.6.  EXAM: CT ANGIOGRAPHY CHEST WITH CONTRAST  TECHNIQUE: Multidetector CT imaging of the chest was performed using the standard protocol during bolus administration of intravenous contrast. Multiplanar CT image reconstructions and MIPs were obtained to evaluate the vascular anatomy.  CONTRAST:  193mL OMNIPAQUE IOHEXOL 350 MG/ML SOLN  COMPARISON:  06/17/2013  FINDINGS: Left central venous catheter  remains unchanged in position. Technically adequate study with moderately good opacification of central and proximal segmental pulmonary arteries. Distal segmental vessels are not well demonstrated due to motion artifact. There is no focal filling defect in the visualized pulmonary arteries. No significant pulmonary embolus is suggested.  Cardiac enlargement. Normal caliber thoracic aorta. Moderately prominent lymph nodes in the anterior mediastinum without change since prior study. Esophagus is decompressed. Evaluation of lungs is limited due to respiratory motion artifact but there appears to be patchy infiltration in the lung periphery bilaterally, more prominent in the bases. Scattered nodular changes are demonstrated similar previous study. Infiltrative changes are some of the prior study and may represent infiltration due to pneumonia, atelectasis, or acute chest syndrome. No pleural effusion or pneumothorax. Diffusely increased density and atrophy of the spleen consistent with sickle cell  related changes. Thoracic vertebral changes consistent with sickle cell.  Review of the MIP images confirms the above findings.  IMPRESSION: No evidence of significant pulmonary embolus. Evaluation of lungs is limited due to respiratory motion artifact but there appears to be patchy peripheral infiltrative changes bilaterally, similar to prior study. Cardiac enlargement and prominent anterior mediastinal lymph nodes are also unchanged. Changes consistent with sickle cell effect in the spleen and spine.   Electronically Signed   By: Lucienne Capers M.D.   On: 08/26/2013 02:34    Microbiology: Recent Results (from the past 240 hour(s))  MRSA PCR SCREENING     Status: None   Collection Time    08/26/13  3:04 AM      Result Value Ref Range Status   MRSA by PCR NEGATIVE  NEGATIVE Final   Comment:            The GeneXpert MRSA Assay (FDA     approved for NASAL specimens     only), is one component of a     comprehensive MRSA colonization     surveillance program. It is not     intended to diagnose MRSA     infection nor to guide or     monitor treatment for     MRSA infections.  CULTURE, EXPECTORATED SPUTUM-ASSESSMENT     Status: None   Collection Time    08/29/13  7:45 AM      Result Value Ref Range Status   Specimen Description SPUTUM   Final   Special Requests Normal   Final   Sputum evaluation     Final   Value: MICROSCOPIC FINDINGS SUGGEST THAT THIS SPECIMEN IS NOT REPRESENTATIVE OF LOWER RESPIRATORY SECRETIONS. PLEASE RECOLLECT. SPOKE WITH BALWIN,R RN AT [phone] 07.30.15 BY K.TIBBITTS   Report Status 08/29/2013 FINAL   Final     Labs: Basic Metabolic Panel:  Recent Labs Lab 08/29/13 0545  NA 138  K 4.0  CL 104  CO2 21  GLUCOSE 110*  BUN 9  CREATININE 0.56  CALCIUM 9.1   Liver Function Tests:  Recent Labs Lab 08/29/13 0545  AST 42*  ALT 24  ALKPHOS 105  BILITOT 8.4*  PROT 7.4  ALBUMIN 4.0   No results found for this basename: LIPASE, AMYLASE,  in the last 168  hours No results found for this basename: AMMONIA,  in the last 168 hours CBC:  Recent Labs Lab 08/29/13 0545 09/03/13 0955  WBC 18.8* 14.1*  NEUTROABS 15.4* 10.1*  HGB 6.9* 6.9*  HCT 19.1* 19.8*  MCV 88.0 93.0  PLT 468* 564*    Time coordinating discharge: Greater than 30 minutes  Signed:  Hollis,Lachina M  09/03/2013, 6:07 PM   Pt seen and examined and discussed with NP Cammie Sickle. Agree with discharge home.  Maday Guarino A.

## 2013-09-03 NOTE — Telephone Encounter (Signed)
Explained that I notified Dr. Zigmund Daniel and it is ok for him to come to [facility].  Patient states he is waiting on a ride so he will be here in approx 20 mins.

## 2013-09-03 NOTE — Telephone Encounter (Signed)
Patient C/O pain to ribs, back and legs, that he rates 8/10 on pain scale.  Patient has taken MS contin and dilaudid without any relief.  Patient is requesting to come to [facility].  Patient denies N/V,fever, chest pain, shortness of breath, abdominal pain or diarrhea. I advised I would notify the physician and give him a call back.  Patient verbalizes understanding

## 2013-09-03 NOTE — Discharge Instructions (Signed)
Sickle Cell Anemia, Adult °Sickle cell anemia is a condition in which red blood cells have an abnormal "sickle" shape. This abnormal shape shortens the cells' life span, which results in a lower than normal concentration of red blood cells in the blood. The sickle shape also causes the cells to clump together and block free blood flow through the blood vessels. As a result, the tissues and organs of the body do not receive enough oxygen. Sickle cell anemia causes organ damage and pain and increases the risk of infection. °CAUSES  °Sickle cell anemia is a genetic disorder. Those who receive two copies of the gene have the condition, and those who receive one copy have the trait. °RISK FACTORS °The sickle cell gene is most common in people whose families originated in [place]. Other areas of the globe where sickle cell trait occurs include the [place], [place] and [place], the [place], and the [place].  °SIGNS AND SYMPTOMS °· Pain, especially in the extremities, back, chest, or abdomen (common). The pain may start suddenly or may develop following an illness, especially if there is dehydration. Pain can also occur due to overexertion or exposure to extreme temperature changes. °· Frequent severe bacterial infections, especially certain types of pneumonia and meningitis. °· Pain and swelling in the hands and feet. °· Decreased activity.   °· Loss of appetite.   °· Change in behavior. °· Headaches. °· Seizures. °· Shortness of breath or difficulty breathing. °· Vision changes. °· Skin ulcers. °Those with the trait may not have symptoms or they may have mild symptoms.  °DIAGNOSIS  °Sickle cell anemia is diagnosed with blood tests that demonstrate the genetic trait. It is often diagnosed during the newborn period, due to mandatory testing nationwide. A variety of blood tests, X-rays, CT scans, MRI scans, ultrasounds, and lung function tests may also be done to monitor the condition. °TREATMENT  °Sickle  cell anemia may be treated with: °· Medicines. You may be given pain medicines, antibiotic medicines (to treat and prevent infections) or medicines to increase the production of certain types of hemoglobin. °· Fluids. °· Oxygen. °· Blood transfusions. °HOME CARE INSTRUCTIONS  °· Drink enough fluid to keep your urine clear or pale yellow. Increase your fluid intake in hot weather and during exercise. °· Do not smoke. Smoking lowers oxygen levels in the blood.   °· Only take over-the-counter or prescription medicines for pain, fever, or discomfort as directed by your health care provider. °· Take antibiotics as directed by your health care provider. Make sure you finish them it even if you start to feel better.   °· Take supplements as directed by your health care provider.   °· Consider wearing a medical alert bracelet. This tells anyone caring for you in an emergency of your condition.   °· When traveling, keep your medical information, health care provider's names, and the medicines you take with you at all times.   °· If you develop a fever, do not take medicines to reduce the fever right away. This could cover up a problem that is developing. Notify your health care provider. °· Keep all follow-up appointments with your health care provider. Sickle cell anemia requires regular medical care. °SEEK MEDICAL CARE IF: ° You have a fever. °SEEK IMMEDIATE MEDICAL CARE IF:  °· You feel dizzy or faint.   °· You have new abdominal pain, especially on the left side near the stomach area.   °· You develop a persistent, often uncomfortable and painful penile erection (priapism). If this is not treated immediately it   will lead to impotence.   °· You have numbness your arms or legs or you have a hard time moving them.   °· You have a hard time with speech.   °· You have a fever or persistent symptoms for more than 2-3 days.   °· You have a fever and your symptoms suddenly get worse.   °· You have signs or symptoms of infection.  These include:   °¨ Chills.   °¨ Abnormal tiredness (lethargy).   °¨ Irritability.   °¨ Poor eating.   °¨ Vomiting.   °· You develop pain that is not helped with medicine.   °· You develop shortness of breath. °· You have pain in your chest.   °· You are coughing up pus-like or bloody sputum.   °· You develop a stiff neck. °· Your feet or hands swell or have pain. °· Your abdomen appears bloated. °· You develop joint pain. °MAKE SURE YOU: °· Understand these instructions. °Document Released: 04/27/2005 Document Revised: 06/03/2013 Document Reviewed: 08/29/2012 °[employer]® Patient Information ©2015 [employer]. This information is not intended to replace advice given to you by your health care provider. Make sure you discuss any questions you have with your health care provider. ° °

## 2013-09-03 NOTE — H&P (Signed)
[facility] History and Physical  [name] KDT:[id number] DOB: 17-Apr-1980 DOA: 09/03/2013   PCP: Yonael Tulloch A., MD   Chief Complaint: Pain in left chest wall and back.   HPI:Pt states that he has been having pain in his  left chest wall since leaving the hospital. He has been without his long acting medications as he thought he would not be able to obtain medications since it was not quite 30 days. He has thus been using only his short acting medications which have been ineffective. He describes the pain as throbbing and sharp. It is not associated with any SOB or cough. He reports that he did not fill his prescription for his antibiotic upon discharge from the Hospital. He denies any fevers/chills/N/V//D.   Review of Systems:  Constitutional: No weight loss, night sweats, Fevers, chills, fatigue.  HEENT: No headaches, dizziness, seizures, vision changes, difficulty swallowing,Tooth/dental problems,Sore throat, No sneezing, itching, ear ache, nasal congestion, post nasal drip,  Cardio-vascular: No chest pain, Orthopnea, PND, swelling in lower extremities, anasarca, dizziness, palpitations  GI: No heartburn, indigestion, abdominal pain, nausea, vomiting, diarrhea, change in bowel habits, loss of appetite  Resp: No shortness of breath with exertion or at rest. No excess mucus, no productive cough, No non-productive cough, No coughing up of blood.No change in color of mucus.No wheezing.No chest wall deformity  Skin: no rash or lesions.  GU: no dysuria, change in color of urine, no urgency or frequency. No flank pain.   Psych: No change in mood or affect. No depression or anxiety. No memory loss.    Past Medical History  Diagnosis Date  . Sickle cell anemia   . Blood transfusion   . 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 Hypokalemia   . Mood disorder   . History of pulmonary embolus (PE)   . Avascular necrosis   . Leukocytosis    Chronic  . Thrombocytosis     Chronic  . Hypertension   . History of Clostridium difficile infection   . Uses marijuana   . Chronic anticoagulation   . Functional asplenia   . Former smoker   . Second hand tobacco smoke exposure   . Alcohol consumption of one to four drinks per day    Past Surgical History  Procedure Laterality Date  . Right hip replacement      08/2006  . Cholecystectomy      01/2008  . Porta cath placement    . Porta cath removal    . Umbilical hernia repair      01/2008  . Excision of left periauricular cyst      10/2009  . Excision of right ear lobe cyst with primary closur      11/2007  . Portacath placement  01/05/2012    Procedure: INSERTION PORT-A-CATH;  Surgeon: Odis Hollingshead, MD;  Location: [facility];  Service: General;  Laterality: N/A;  ultrasound guiced port a cath insertion with fluoroscopy   Social History:  reports that he quit smoking about 3 years ago. He has never used smokeless tobacco. He reports that he uses illicit drugs (Marijuana) about twice per week. He reports that he does not drink alcohol.  Allergies  Allergen Reactions  . Morphine And Related Hives and Rash    Pt states he can take PO, but not IV and he is able to tolerate Dilaudid with no reactions.    Family History  Problem Relation Age of Onset  .  Sickle cell anemia Mother   . Sickle cell anemia Father   . Sickle cell trait Brother     Prior to Admission medications   Medication Sig Start Date End Date Taking? Authorizing Provider  celecoxib (CELEBREX) 200 MG capsule Take 1 capsule (200 mg total) by mouth 2 (two) times daily. 07/31/13  Yes Dorena Dew, FNP  folic acid (FOLVITE) 1 MG tablet Take 1 tablet (1 mg total) by mouth every morning. 08/22/13  Yes [name], MD  HYDROmorphone (DILAUDID) 4 MG tablet Take 1 tablet (4 mg total) by mouth every 4 (four) hours as needed for severe pain. 08/22/13  Yes [name], MD  levofloxacin (LEVAQUIN) 750 MG  tablet Take 1 tablet (750 mg total) by mouth daily. 08/29/13  Yes Elwyn Reach, MD  lisinopril (PRINIVIL,ZESTRIL) 10 MG tablet Take 1 tablet (10 mg total) by mouth daily. 08/22/13  Yes [name], MD  metoprolol tartrate (LOPRESSOR) 25 MG tablet Take 1 tablet (25 mg total) by mouth daily. 08/22/13  Yes [name], MD  potassium chloride SA (K-DUR,KLOR-CON) 20 MEQ tablet Take 1 tablet (20 mEq total) by mouth every morning. 07/31/13  Yes Dorena Dew, FNP  rivaroxaban (XARELTO) 20 MG TABS tablet Take 1 tablet (20 mg total) by mouth every morning. 08/22/13  Yes [name], MD  zolpidem (AMBIEN) 10 MG tablet Take 1 tablet (10 mg total) by mouth at bedtime as needed for sleep. 08/22/13  Yes [name], MD  hydroxyurea (HYDREA) 500 MG capsule Take 2 capsules (1,000 mg total) by mouth daily. May take with food to minimize GI side effects. 08/29/13   Elwyn Reach, MD  morphine (MS CONTIN) 15 MG 12 hr tablet Take 1 tablet (15 mg total) by mouth 2 (two) times daily. 08/22/13   [name], MD  morphine (MS CONTIN) 30 MG 12 hr tablet Take 1 tablet (30 mg total) by mouth every 12 (twelve) hours. 09/03/13   [name], MD   Physical Exam: Filed Vitals:   09/03/13 0931  BP: 110/67  Pulse: 94  Temp: 98.6 F (37 C)  TempSrc: Oral  Resp: 18  Height: 6' (1.829 m)  Weight: 163 lb (73.936 kg)  SpO2: 93%   General: Alert, awake, oriented x3, in no acute distress.  HEENT: [place]/AT PEERL, EOMI, mild icterus at baseline. Neck: Trachea midline,  no masses, no thyromegal,y no JVD, no carotid bruit OROPHARYNX:  Moist, No exudate/ erythema/lesions.  Heart: Regular rate and rhythm, without murmurs, rubs, gallops, PMI non-displaced, no heaves or thrills on palpation.  Lungs: Clear to auscultation, no wheezing or rhonchi noted. No increased vocal fremitus resonant to percussion  Abdomen: Soft, nontender, nondistended, positive bowel sounds, no masses no  hepatosplenomegaly noted..  Neuro: No focal neurological deficits noted cranial nerves II through XII grossly intact.   Labs on Admission:   Basic Metabolic Panel:  Recent Labs Lab 08/27/13 1043 08/29/13 0545  NA 140 138  K 3.8 4.0  CL 105 104  CO2 20 21  GLUCOSE 136* 110*  BUN 8 9  CREATININE 0.59 0.56  CALCIUM 9.2 9.1   Liver Function Tests:  Recent Labs Lab 08/29/13 0545  AST 42*  ALT 24  ALKPHOS 105  BILITOT 8.4*  PROT 7.4  ALBUMIN 4.0   CBC:  Recent Labs Lab 08/27/13 1043 08/29/13 0545  WBC 22.1* 18.8*  NEUTROABS 16.1* 15.4*  HGB 7.4* 6.9*  HCT 21.1* 19.1*  MCV 90.9  88.0  PLT 453* 468*    Assessment/Plan: Active Problems: 1. Hb SS with crisis: Pt will be treated with initially treated with weight based rapid re-dosing, IVF and Toradol . If pain is above 7/10,  then Pt will be transitioned to a weight based Dilaudid PCA . Patient will be re-evaluated for pain in the context of function and relationship to baseline as care progresses. Goal of treatment is pain level of 5/10. Pt's MS Contin has been increased to 30 mg every 12 hours since last admission. He was advised to increase his dosing to 2 pills BID until prescription completed and then start new prescription of 30 mg BID. (New prescription issued today).     Time spend: 50 minutes Code Status: Full Code Family Communication: N/A Disposition Plan: Likely Home at Discharge.  Krishna Heuer A., MD  Pager [phone]  If 7PM-7AM, please contact night-coverage [email or web address] Password [email or web address] 09/03/2013, 9:58 AM

## 2013-09-03 NOTE — Progress Notes (Signed)
Patient reports pain 4/10 and he is ready for discharge. Patient in no apparent distress. Discharge instructions reviewed, Patient reports to return tomorrow for [facility] appointment. Patient acknowledges. Patient left day hospital with belongings ambulatory via self.

## 2013-09-03 NOTE — Progress Notes (Signed)
CRITICAL VALUE ALERT  Critical value received:Hgb 6.9  Date of notification:  09/03/2013  Time of notification: 11:19a.m.  Critical value read back:yes  Nurse who received alert: Roberto Scales, RN  Cammie Sickle, NP verbally notified @ 11:26 /

## 2013-09-04 ENCOUNTER — Ambulatory Visit: Payer: Medicare Other

## 2013-09-04 ENCOUNTER — Telehealth: Payer: Self-pay | Admitting: Internal Medicine

## 2013-09-04 NOTE — Telephone Encounter (Signed)
Left voicemail to cancel substance abuse counseling appointment for 09/04/13.

## 2013-09-05 ENCOUNTER — Telehealth: Payer: Self-pay

## 2013-09-05 NOTE — Telephone Encounter (Signed)
Patient has no Showed for 2 cardiology appointments, (07/04/2013). (09/04/2013) Office will not schedule again. Just an [place]

## 2013-09-11 NOTE — Telephone Encounter (Signed)
Pt called center today wanting to come in for pain evaluation, denies chest pain,difficulty breathing,fever,diarrhea, priapism, Juluis Mire NP called, stated patient could come in for evaluation, Returned call to pt with instructions   By Cora Collum, RN

## 2013-09-11 NOTE — Telephone Encounter (Signed)
Pt called for possible evaluation for pain control, rates pain at 8 or 9. Genice Rouge called. Stated pt could come in for treatment, Pt called and instructed to come into center for evaluation.

## 2013-09-15 IMAGING — CR DG CHEST 1V PORT
1 series · 1 of 1 positions shown · non-contrast
Comparison: 09/30/2010

CLINICAL DATA: Bilateral rib pain

PORTABLE CHEST - 1 VIEW

[AP]
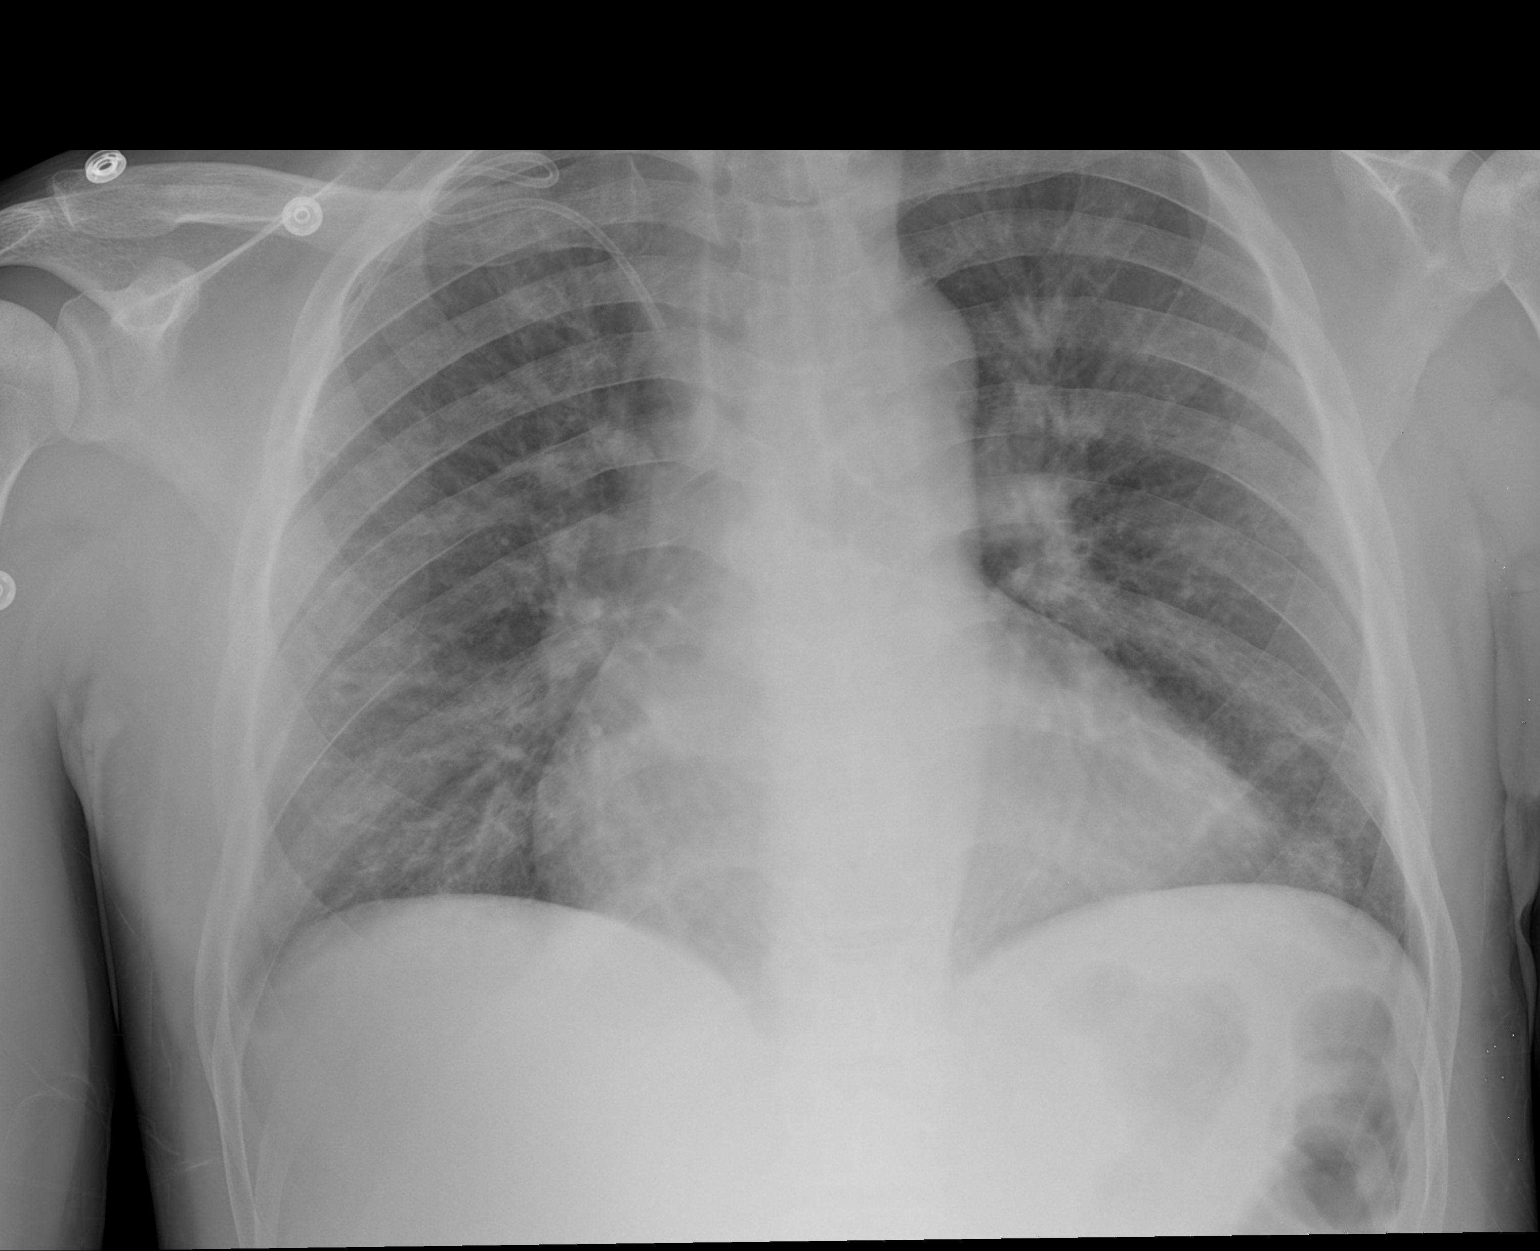

[1 of 1 positions shown; findings below may reference images not displayed]

FINDINGS: Right Port-A-Cath has pulled back and is now coiled in
the right subclavian vein.  The tip is in the right innominate vein
which has changed from the  prior study.

Cardiac enlargement with mild vascular congestion, similar to the
prior study.  No edema or effusion.  Negative for pneumonia.
IMPRESSION: Cardiac enlargement with vascular congestion.

Port-A-Cath has pulled back into the right innominate vein since
the prior study.

## 2013-09-17 ENCOUNTER — Encounter (HOSPITAL_COMMUNITY): Payer: Self-pay | Admitting: *Deleted

## 2013-09-17 ENCOUNTER — Telehealth (HOSPITAL_COMMUNITY): Payer: Self-pay | Admitting: Hematology

## 2013-09-17 ENCOUNTER — Non-Acute Institutional Stay (HOSPITAL_COMMUNITY)
Admission: AD | Admit: 2013-09-17 | Discharge: 2013-09-17 | Disposition: A | Discharge status: Home / Self Care | Payer: Medicare Other | Attending: Internal Medicine | Admitting: Internal Medicine

## 2013-09-17 DIAGNOSIS — D5700 Hb-SS disease with crisis, unspecified: Secondary | ICD-10-CM

## 2013-09-17 DIAGNOSIS — Z9089 Acquired absence of other organs: Secondary | ICD-10-CM | POA: Insufficient documentation

## 2013-09-17 DIAGNOSIS — R52 Pain, unspecified: Secondary | ICD-10-CM | POA: Diagnosis present

## 2013-09-17 DIAGNOSIS — Z79899 Other long term (current) drug therapy: Secondary | ICD-10-CM | POA: Insufficient documentation

## 2013-09-17 DIAGNOSIS — Z86718 Personal history of other venous thrombosis and embolism: Secondary | ICD-10-CM | POA: Insufficient documentation

## 2013-09-17 DIAGNOSIS — Z87891 Personal history of nicotine dependence: Secondary | ICD-10-CM | POA: Diagnosis not present

## 2013-09-17 DIAGNOSIS — Z7901 Long term (current) use of anticoagulants: Secondary | ICD-10-CM | POA: Diagnosis not present

## 2013-09-17 DIAGNOSIS — I10 Essential (primary) hypertension: Secondary | ICD-10-CM | POA: Insufficient documentation

## 2013-09-17 DIAGNOSIS — Z96649 Presence of unspecified artificial hip joint: Secondary | ICD-10-CM | POA: Diagnosis not present

## 2013-09-17 DIAGNOSIS — D571 Sickle-cell disease without crisis: Secondary | ICD-10-CM

## 2013-09-17 LAB — RETICULOCYTES
RBC.: 2.09 MIL/uL — AB (ref 4.22–5.81)
RETIC CT PCT: 19.7 % — AB (ref 0.4–3.1)
Retic Count, Absolute: 411.7 10*3/uL — ABNORMAL HIGH (ref 19.0–186.0)

## 2013-09-17 LAB — COMPREHENSIVE METABOLIC PANEL
ALBUMIN: 4.1 g/dL (ref 3.5–5.2)
ALT: 23 U/L (ref 0–53)
ANION GAP: 13 (ref 5–15)
AST: 39 U/L — ABNORMAL HIGH (ref 0–37)
Alkaline Phosphatase: 86 U/L (ref 39–117)
BILIRUBIN TOTAL: 7.4 mg/dL — AB (ref 0.3–1.2)
BUN: 7 mg/dL (ref 6–23)
CALCIUM: 9.1 mg/dL (ref 8.4–10.5)
CO2: 21 mEq/L (ref 19–32)
Chloride: 105 mEq/L (ref 96–112)
Creatinine, Ser: 0.6 mg/dL (ref 0.50–1.35)
GFR calc Af Amer: >90 mL/min (ref >90)
GFR calc non Af Amer: >90 mL/min (ref >90)
Glucose, Bld: 95 mg/dL (ref 70–99)
Potassium: 3.8 mEq/L (ref 3.7–5.3)
Sodium: 139 mEq/L (ref 137–147)
TOTAL PROTEIN: 7.4 g/dL (ref 6.0–8.3)

## 2013-09-17 LAB — CBC WITH DIFFERENTIAL/PLATELET
BASOS ABS: 0 10*3/uL (ref 0.0–0.1)
Basophils Relative: 0 % (ref 0–1)
EOS PCT: 2 % (ref 0–5)
Eosinophils Absolute: 0.3 10*3/uL (ref 0.0–0.7)
HCT: 19.2 % — ABNORMAL LOW (ref 39.0–52.0)
Hemoglobin: 6.8 g/dL — CL (ref 13.0–17.0)
Lymphocytes Relative: 10 % — ABNORMAL LOW (ref 12–46)
Lymphs Abs: 1.4 10*3/uL (ref 0.7–4.0)
MCH: 32.5 pg (ref 26.0–34.0)
MCHC: 35.4 g/dL (ref 30.0–36.0)
MCV: 91.9 fL (ref 78.0–100.0)
MONOS PCT: 11 % (ref 3–12)
Monocytes Absolute: 1.5 10*3/uL — ABNORMAL HIGH (ref 0.1–1.0)
Neutro Abs: 10.7 10*3/uL — ABNORMAL HIGH (ref 1.7–7.7)
Neutrophils Relative %: 77 % (ref 43–77)
PLATELETS: 595 10*3/uL — AB (ref 150–400)
RBC: 2.09 MIL/uL — AB (ref 4.22–5.81)
RDW: 20.7 % — AB (ref 11.5–15.5)
WBC: 13.9 10*3/uL — AB (ref 4.0–10.5)
nRBC: 1 /100 WBC — ABNORMAL HIGH

## 2013-09-17 MED ORDER — ONDANSETRON HCL 4 MG PO TABS: 4.0000 mg | ORAL_TABLET | ORAL | Status: DC | PRN

## 2013-09-17 MED ORDER — HYDROMORPHONE 0.3 MG/ML IV SOLN
INTRAVENOUS | Status: DC
  Administered 2013-09-17: 10:00:00 via INTRAVENOUS

## 2013-09-17 MED ORDER — KETOROLAC TROMETHAMINE 30 MG/ML IJ SOLN
30.0000 mg | Freq: Four times a day (QID) | INTRAMUSCULAR | Status: DC
  Administered 2013-09-17 (×2): 30 mg via INTRAVENOUS
  Filled 2013-09-17 (×2): qty 1

## 2013-09-17 MED ORDER — NALOXONE HCL 0.4 MG/ML IJ SOLN: 0.4000 mg | INTRAMUSCULAR | Status: DC | PRN

## 2013-09-17 MED ORDER — SODIUM CHLORIDE 0.9 % IJ SOLN: 9.0000 mL | INTRAMUSCULAR | Status: DC | PRN

## 2013-09-17 MED ORDER — HYDROMORPHONE 0.3 MG/ML IV SOLN
INTRAVENOUS | Status: DC
  Filled 2013-09-17: qty 25

## 2013-09-17 MED ORDER — HYDROMORPHONE 0.3 MG/ML IV SOLN
INTRAVENOUS | Status: DC
  Administered 2013-09-17: 13:00:00 via INTRAVENOUS
  Administered 2013-09-17: 2.39 mg via INTRAVENOUS
  Administered 2013-09-17: 17:00:00 via INTRAVENOUS
  Administered 2013-09-17: 14.74 mg via INTRAVENOUS
  Filled 2013-09-17 (×2): qty 25

## 2013-09-17 MED ORDER — HEPARIN SOD (PORK) LOCK FLUSH 100 UNIT/ML IV SOLN
500.0000 [IU] | INTRAVENOUS | Status: AC | PRN
Start: 2013-09-17 — End: 2013-09-17
  Administered 2013-09-17: 500 [IU]
  Filled 2013-09-17: qty 5

## 2013-09-17 MED ORDER — DIPHENHYDRAMINE HCL 25 MG PO CAPS
25.0000 mg | ORAL_CAPSULE | ORAL | Status: DC | PRN
  Administered 2013-09-17: 50 mg via ORAL
  Filled 2013-09-17: qty 2

## 2013-09-17 MED ORDER — SODIUM CHLORIDE 0.9 % IJ SOLN
INTRAMUSCULAR | Status: AC
  Filled 2013-09-17: qty 10

## 2013-09-17 MED ORDER — SODIUM CHLORIDE 0.9 % IJ SOLN
10.0000 mL | INTRAMUSCULAR | Status: AC | PRN
  Administered 2013-09-17: 10 mL

## 2013-09-17 MED ORDER — ONDANSETRON HCL 4 MG/2ML IJ SOLN
4.0000 mg | INTRAMUSCULAR | Status: DC | PRN
Start: 2013-09-17 — End: 2013-09-17

## 2013-09-17 MED ORDER — DEXTROSE-NACL 5-0.45 % IV SOLN
INTRAVENOUS | Status: DC
  Administered 2013-09-17: 10:00:00 via INTRAVENOUS

## 2013-09-17 MED ORDER — SODIUM CHLORIDE 0.9 % IV SOLN
25.0000 mg | INTRAVENOUS | Status: DC | PRN
  Filled 2013-09-17: qty 0.5

## 2013-09-17 MED ORDER — FOLIC ACID 1 MG PO TABS
1.0000 mg | ORAL_TABLET | Freq: Every day | ORAL | Status: DC
  Administered 2013-09-17: 1 mg via ORAL
  Filled 2013-09-17: qty 1

## 2013-09-17 MED ORDER — HYDROMORPHONE HCL 4 MG PO TABS: 4.0000 mg | ORAL_TABLET | Freq: Once | ORAL | Status: DC

## 2013-09-17 NOTE — H&P (Signed)
[facility] History and Physical  [name] VFI:[id number] DOB: 1980-03-09 DOA: 09/17/2013   PCP: [name],[name], MD   Chief Complaint: Generalized pain  HPI: Patient with a history of sickle cell anemia, Hb SS that has frequent vaso-occlusive crisis presents to the day hospital   complaining of pain to ribs and upper thighs since 4 days ago. 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  Pain intensity is 8/10 to ribs and upper thigh. Patient reports that he took his home dilaudid at 6 am with minimal relief. Patient denies shortness of breath, chest pains, headache, nausea, vomiting or diarrhea. Will admit to the day infusion center for extended observation.    Review of Systems:  Constitutional: No weight loss, night sweats, Fevers, chills, fatigue.  HEENT: No headaches, dizziness, seizures, vision changes, difficulty swallowing,Tooth/dental problems,Sore throat, No sneezing, itching, ear ache, nasal congestion, post nasal drip,  Cardio-vascular: No chest pain, Orthopnea, PND, swelling in lower extremities, anasarca, dizziness, palpitations  GI: No heartburn, indigestion, abdominal pain, nausea, vomiting, diarrhea, change in bowel habits, loss of appetite  Resp: No shortness of breath with exertion or at rest. No excess mucus, no productive cough, No non-productive cough, No coughing up of blood.No change in color of mucus.No wheezing.No chest wall deformity  Skin: no rash or lesions.  GU: no dysuria, change in color of urine, no urgency or frequency. No flank pain.  Musculoskeletal: Bilateral flank pain. No decreased range of motion. No back pain. Upper thigh tenderness Psych: No change in mood or affect. No depression or anxiety. No memory loss.    Past Medical History  Diagnosis Date  . Sickle cell anemia   . Blood transfusion   . 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  Hypokalemia   . Mood disorder   . History of pulmonary embolus (PE)   . Avascular necrosis   . Leukocytosis     Chronic  . Thrombocytosis     Chronic  . Hypertension   . History of Clostridium difficile infection   . Uses marijuana   . Chronic anticoagulation   . Functional asplenia   . Former smoker   . Second hand tobacco smoke exposure   . Alcohol consumption of one to four drinks per day    Past Surgical History  Procedure Laterality Date  . Right hip replacement      08/2006  . Cholecystectomy      01/2008  . Porta cath placement    . Porta cath removal    . Umbilical hernia repair      01/2008  . Excision of left periauricular cyst      10/2009  . Excision of right ear lobe cyst with primary closur      11/2007  . Portacath placement  01/05/2012    Procedure: INSERTION PORT-A-CATH;  Surgeon: Odis Hollingshead, MD;  Location: [place];  Service: General;  Laterality: N/A;  ultrasound guiced port a cath insertion with fluoroscopy   Social History:  reports that he quit smoking about 3 years ago. He has never used smokeless tobacco. He reports that he uses illicit drugs (Marijuana) about twice per week. He reports that he does not drink alcohol.  Allergies  Allergen Reactions  . Morphine And Related Hives and Rash    Pt states he can take PO, but not IV and he is able to tolerate Dilaudid  with no reactions.    Family History  Problem Relation Age of Onset  . Sickle cell anemia Mother   . Sickle cell anemia Father   . Sickle cell trait Brother     Prior to Admission medications   Medication Sig Start Date End Date Taking? Authorizing Provider  celecoxib (CELEBREX) 200 MG capsule Take 1 capsule (200 mg total) by mouth 2 (two) times daily. 07/31/13  Yes Dorena Dew, FNP  folic acid (FOLVITE) 1 MG tablet Take 1 tablet (1 mg total) by mouth every morning. 08/22/13  Yes [name], MD  HYDROmorphone (DILAUDID) 4 MG tablet Take 1 tablet (4 mg total) by mouth  every 4 (four) hours as needed for severe pain. 08/22/13  Yes [name], MD  hydroxyurea (HYDREA) 500 MG capsule Take 2 capsules (1,000 mg total) by mouth daily. May take with food to minimize GI side effects. 08/29/13  Yes Elwyn Reach, MD  levofloxacin (LEVAQUIN) 750 MG tablet Take 1 tablet (750 mg total) by mouth daily. 08/29/13  Yes Elwyn Reach, MD  lisinopril (PRINIVIL,ZESTRIL) 10 MG tablet Take 1 tablet (10 mg total) by mouth daily. 08/22/13  Yes [name], MD  metoprolol tartrate (LOPRESSOR) 25 MG tablet Take 1 tablet (25 mg total) by mouth daily. 08/22/13  Yes [name], MD  morphine (MS CONTIN) 30 MG 12 hr tablet Take 1 tablet (30 mg total) by mouth every 12 (twelve) hours. 09/03/13  Yes [name], MD  potassium chloride SA (K-DUR,KLOR-CON) 20 MEQ tablet Take 1 tablet (20 mEq total) by mouth every morning. 07/31/13  Yes Dorena Dew, FNP  rivaroxaban (XARELTO) 20 MG TABS tablet Take 1 tablet (20 mg total) by mouth every morning. 08/22/13  Yes [name], MD  zolpidem (AMBIEN) 10 MG tablet Take 1 tablet (10 mg total) by mouth at bedtime as needed for sleep. 08/22/13  Yes [name], MD   Physical Exam: Filed Vitals:   09/17/13 1045 09/17/13 1055 09/17/13 1155 09/17/13 1246  BP: 109/65 109/57 105/67   Pulse: 66 68 79   Temp: 98.7 F (37.1 C) 98.7 F (37.1 C) 98.7 F (37.1 C)   TempSrc: Oral Oral Oral   Resp: 17 17 19 20   SpO2: 94% 94% 94%    General: Alert, awake, oriented x3, in no mild acute distress.  HEENT: [place]/AT PEERL, EOMI Neck: Trachea midline,  no masses, no thyromegal,y no JVD, no carotid bruit OROPHARYNX:  Moist, No exudate/ erythema/lesions.  Heart: Regular rate and rhythm, murmur present, rubs, gallops, PMI non-displaced, no heaves or thrills on palpation.  Lungs: Clear to auscultation, no wheezing or rhonchi noted. No increased vocal fremitus resonant to percussion  Abdomen: Soft, nontender, nondistended,  positive bowel sounds, no masses no hepatosplenomegaly noted..  Neuro: No focal neurological deficits noted cranial nerves II through XII grossly intact.   Labs on Admission:   Basic Metabolic Panel:  Recent Labs Lab 09/17/13 1000  NA 139  K 3.8  CL 105  CO2 21  GLUCOSE 95  BUN 7  CREATININE 0.60  CALCIUM 9.1   Liver Function Tests:  Recent Labs Lab 09/17/13 1000  AST 39*  ALT 23  ALKPHOS 86  BILITOT 7.4*  PROT 7.4  ALBUMIN 4.1   CBC:  Recent Labs Lab 09/17/13 1000  WBC 13.9*  NEUTROABS 10.7*  HGB 6.8*  HCT 19.2*  MCV 91.9  PLT 595*   BNP: No components found with this basename:  POCBNP,    Radiological Exams on Admission: No results found.  EKG: Independently reviewed.   Assessment/Plan: Active Problems:   Sickle cell anemia with pain  Sickle cell anemia: Admitted to day hospital for extended observation. Current plan outlined by Dr. Jonelle Sidle Start hypotonic IV fluids for cellular rehydration Start Dilaudid PCA loading dose of 1.5 mg. Start weight based Dilaudid PCA Start Toradol 30 mg x1 for inflammation Will check cbc w/differential, CMP, and Reticulocyte count. Will check labs as they become available    Time spend: Greater than 30 minutes Code Status: Full Family Communication: None Disposition Plan: Home when stable  Dorena Dew, FNP Pager [phone]  If 7PM-7AM, please contact night-coverage [email or web address] Password [facility] 09/17/2013, 12:56 PM

## 2013-09-17 NOTE — Telephone Encounter (Signed)
Patient C/O side and leg pain that is 8/10 on scale.  Patient states he has taken MS Contin 30 and dialudid around 6 a.m. without any improvement.  Patient denies N/V, chest pain or shortness breath, diarrhea or priapism.  Patient is requesting to come to [facility].  I advised I would notify the physician and give him a call back.  Patient verbalizes understanding.

## 2013-09-17 NOTE — Progress Notes (Signed)
CRITICAL VALUE ALERT  Critical value received: Hgb 6.8  Date of notification: 09/17/2013  Time of notification: 1109  Critical value read back: yes  Nurse who received alert:  K. Saunders Revel RN MD notified: Dr. Jonelle Sidle  Time of call:1110  Responding MD: Dr. Jonelle Sidle aware Time MD responded: [phone]

## 2013-09-17 NOTE — Discharge Summary (Signed)
Physician Discharge Summary  [name] HQI:[id number] DOB: [birth year] DOA: 09/17/2013  PCP: [name],[name], MD  Admit date: 09/17/2013 Discharge date: 09/17/2013  Discharge Diagnoses:  Active Problems:   Sickle cell anemia with pain   Discharge Condition: Stable  Disposition:   Diet:Regular Wt Readings from Last 3 Encounters:  09/03/13 163 lb (73.936 kg)  08/29/13 170 lb 10.2 oz (77.4 kg)  08/22/13 166 lb (75.297 kg)   mg   Hospital Course:  Patient was admitted to the day infusion center for extended observation.Started hypotonic IVFs , he is currently tolerating oral fluids and eating.  Patient was given a loading dose of 1.5 mg of dilaudid. Started Toradol 30 mg times 1.  Patient was also started on IV Dilaudid PCA. He used a total of 17.13 mg with 32 demands and 20 deliveries. Patient reports that current pain intensity is 4/10. He states that he can manage at home on current medication regimen. Reviewed labs, consistent with previous values. Hgb currently 6.9. He was given home dose of Dilaudid 4 mg prior to discharge. Reviewed labs, consistent with prev Patient discharged home with brother in stable condition. Recommend that patient schedule a follow up appointment with cardiologist and Dr. Zigmund Daniel, patient expressed understanding.    Discharge Exam:  Filed Vitals:   09/17/13 1644  BP:   Pulse: 78  Temp:   Resp: 16   Filed Vitals:   09/17/13 1356 09/17/13 1403 09/17/13 1455 09/17/13 1644  BP:   115/65   Pulse:   67 78  Temp:   98.6 F (37 C)   TempSrc:   Oral   Resp:   20 16  SpO2: 88% 95% 95% 95%     General: Alert, awake, oriented x3, in no acute distress.  Heart: Regular rate and rhythm, murmur present, rubs, gallops, PMI non-displaced, no heaves or thrills on palpation.  Lungs: Clear to auscultation, no wheezing or rhonchi noted. No increased vocal fremitus resonant to percussion  Neuro: No focal neurological deficits noted cranial nerves II  through XII grossly intact. DTRs 2+ bilaterally upper and lower extremities. Strength 5 out of 5 in bilateral upper and lower extremities. Musculoskeletal: No warm swelling or erythema around joints, no spinal tenderness noted.  Discharge Instructions     Medication List    ASK your doctor about these medications       celecoxib 200 MG capsule  Commonly known as:  CELEBREX  Take 1 capsule (200 mg total) by mouth 2 (two) times daily.     folic acid 1 MG tablet  Commonly known as:  FOLVITE  Take 1 tablet (1 mg total) by mouth every morning.     HYDROmorphone 4 MG tablet  Commonly known as:  DILAUDID  Take 1 tablet (4 mg total) by mouth every 4 (four) hours as needed for severe pain.     hydroxyurea 500 MG capsule  Commonly known as:  HYDREA  Take 2 capsules (1,000 mg total) by mouth daily. May take with food to minimize GI side effects.     levofloxacin 750 MG tablet  Commonly known as:  LEVAQUIN  Take 1 tablet (750 mg total) by mouth daily.     lisinopril 10 MG tablet  Commonly known as:  PRINIVIL,ZESTRIL  Take 1 tablet (10 mg total) by mouth daily.     metoprolol tartrate 25 MG tablet  Commonly known as:  LOPRESSOR  Take 1 tablet (25 mg total) by mouth daily.     morphine 30  MG 12 hr tablet  Commonly known as:  MS CONTIN  Take 1 tablet (30 mg total) by mouth every 12 (twelve) hours.     potassium chloride SA 20 MEQ tablet  Commonly known as:  K-DUR,KLOR-CON  Take 1 tablet (20 mEq total) by mouth every morning.     rivaroxaban 20 MG Tabs tablet  Commonly known as:  XARELTO  Take 1 tablet (20 mg total) by mouth every morning.     zolpidem 10 MG tablet  Commonly known as:  AMBIEN  Take 1 tablet (10 mg total) by mouth at bedtime as needed for sleep.          The results of significant diagnostics from this hospitalization (including imaging, microbiology, ancillary and laboratory) are listed below for reference.    Significant Diagnostic Studies: Dg Chest 2  View  08/28/2013   CLINICAL DATA:  Shortness of breath.  History of sickle cell anemia.  EXAM: CHEST  2 VIEW  COMPARISON:  CT 08/26/2013.  Radiographs 08/25/2013.  FINDINGS: The left subclavian Port-A-Cath tip is unchanged at the SVC right atrial junction. There is stable cardiomegaly. Generalized accentuation of the interstitial markings and patchy bibasilar opacities are unchanged. There is no progressive consolidation or significant pleural effusion. There are stable endplate changes within the thoracic spine consistent with sickle cell anemia.  IMPRESSION: Radiographically stable chest with cardiomegaly, accentuated interstitial markings and patchy bibasilar pulmonary opacities. No new findings demonstrated.   Electronically Signed   By: Camie Patience M.D.   On: 08/28/2013 14:12   Dg Chest 2 View  08/25/2013   CLINICAL DATA:  Sickle cell crisis.  Shortness of breath.  EXAM: CHEST  2 VIEW  COMPARISON:  08/13/2013 and 07/29/2013  FINDINGS: Power port in place. Chronic cardiomegaly. Pulmonary vascularity is within normal limits. There is slight chronic accentuation of the interstitial markings. No effusions. No acute osseous abnormality.  IMPRESSION: No acute findings. Chronic cardiomegaly. Chronic slight accentuation of the interstitial markings.   Electronically Signed   By: Rozetta Nunnery M.D.   On: 08/25/2013 23:55   Ct Angio Chest Pe W/cm &/or Wo Cm  08/26/2013   CLINICAL DATA:  Bilateral rib pain, worse on the left. History of sickle cell. White cell count 16.6.  EXAM: CT ANGIOGRAPHY CHEST WITH CONTRAST  TECHNIQUE: Multidetector CT imaging of the chest was performed using the standard protocol during bolus administration of intravenous contrast. Multiplanar CT image reconstructions and MIPs were obtained to evaluate the vascular anatomy.  CONTRAST:  155mL OMNIPAQUE IOHEXOL 350 MG/ML SOLN  COMPARISON:  06/17/2013  FINDINGS: Left central venous catheter remains unchanged in position. Technically adequate  study with moderately good opacification of central and proximal segmental pulmonary arteries. Distal segmental vessels are not well demonstrated due to motion artifact. There is no focal filling defect in the visualized pulmonary arteries. No significant pulmonary embolus is suggested.  Cardiac enlargement. Normal caliber thoracic aorta. Moderately prominent lymph nodes in the anterior mediastinum without change since prior study. Esophagus is decompressed. Evaluation of lungs is limited due to respiratory motion artifact but there appears to be patchy infiltration in the lung periphery bilaterally, more prominent in the bases. Scattered nodular changes are demonstrated similar previous study. Infiltrative changes are some of the prior study and may represent infiltration due to pneumonia, atelectasis, or acute chest syndrome. No pleural effusion or pneumothorax. Diffusely increased density and atrophy of the spleen consistent with sickle cell related changes. Thoracic vertebral changes consistent with sickle cell.  Review of  the MIP images confirms the above findings.  IMPRESSION: No evidence of significant pulmonary embolus. Evaluation of lungs is limited due to respiratory motion artifact but there appears to be patchy peripheral infiltrative changes bilaterally, similar to prior study. Cardiac enlargement and prominent anterior mediastinal lymph nodes are also unchanged. Changes consistent with sickle cell effect in the spleen and spine.   Electronically Signed   By: Lucienne Capers M.D.   On: 08/26/2013 02:34    Microbiology: No results found for this or any previous visit (from the past 240 hour(s)).   Labs: Basic Metabolic Panel:  Recent Labs Lab 09/17/13 1000  NA 139  K 3.8  CL 105  CO2 21  GLUCOSE 95  BUN 7  CREATININE 0.60  CALCIUM 9.1   Liver Function Tests:  Recent Labs Lab 09/17/13 1000  AST 39*  ALT 23  ALKPHOS 86  BILITOT 7.4*  PROT 7.4  ALBUMIN 4.1   No results  found for this basename: LIPASE, AMYLASE,  in the last 168 hours No results found for this basename: AMMONIA,  in the last 168 hours CBC:  Recent Labs Lab 09/17/13 1000  WBC 13.9*  NEUTROABS 10.7*  HGB 6.8*  HCT 19.2*  MCV 91.9  PLT 595*   Cardiac Enzymes: No results found for this basename: CKTOTAL, CKMB, CKMBINDEX, TROPONINI,  in the last 168 hours BNP: No components found with this basename: POCBNP,  CBG: No results found for this basename: GLUCAP,  in the last 168 hours Ferritin: No results found for this basename: FERRITIN,  in the last 168 hours  Time coordinating discharge: Greater than 30 minutes  Signed:  Emilly Lavey M  09/17/2013, 5:27 PM

## 2013-09-17 NOTE — Progress Notes (Signed)
Patient reports pain 4/10 and is ready for discharge. Patient discharged home. Patient in no apparent distress. Discharge instructions reviewed. Patient acknowledges. Patient left day hospital with belongings ambulatory via self.

## 2013-09-17 NOTE — H&P (Signed)
[name] is an 33 y.o. male.   Chief Complaint: Pain all over HPI: Patient with sickle cell disease here with pain all over at 9/10. He has taken his home medications but not getting relief. Pain is worsened with movement and no relieved by anything. No fever, no NVD, no SOB. Patient is worried and came to day hospital. Has no idea what triggered the pain.  Past Medical History  Diagnosis Date  . Sickle cell anemia   . Blood transfusion   . 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 Hypokalemia   . Mood disorder   . History of pulmonary embolus (PE)   . Avascular necrosis   . Leukocytosis     Chronic  . Thrombocytosis     Chronic  . Hypertension   . History of Clostridium difficile infection   . Uses marijuana   . Chronic anticoagulation   . Functional asplenia   . Former smoker   . Second hand tobacco smoke exposure   . Alcohol consumption of one to four drinks per day     Past Surgical History  Procedure Laterality Date  . Right hip replacement      08/2006  . Cholecystectomy      01/2008  . Porta cath placement    . Porta cath removal    . Umbilical hernia repair      01/2008  . Excision of left periauricular cyst      10/2009  . Excision of right ear lobe cyst with primary closur      11/2007  . Portacath placement  01/05/2012    Procedure: INSERTION PORT-A-CATH;  Surgeon: Odis Hollingshead, MD;  Location: [facility];  Service: General;  Laterality: N/A;  ultrasound guiced port a cath insertion with fluoroscopy    Family History  Problem Relation Age of Onset  . Sickle cell anemia Mother   . Sickle cell anemia Father   . Sickle cell trait Brother    Social History:  reports that he quit smoking about 3 years ago. He has never used smokeless tobacco. He reports that he uses illicit drugs (Marijuana) about twice per week. He reports that he does not drink alcohol.  Allergies:  Allergies  Allergen Reactions  . Morphine And Related Hives  and Rash    Pt states he can take PO, but not IV and he is able to tolerate Dilaudid with no reactions.    Medications Prior to Admission  Medication Sig Dispense Refill  . celecoxib (CELEBREX) 200 MG capsule Take 1 capsule (200 mg total) by mouth 2 (two) times daily.  60 capsule  1  . folic acid (FOLVITE) 1 MG tablet Take 1 tablet (1 mg total) by mouth every morning.  30 tablet  11  . HYDROmorphone (DILAUDID) 4 MG tablet Take 1 tablet (4 mg total) by mouth every 4 (four) hours as needed for severe pain.  90 tablet  0  . hydroxyurea (HYDREA) 500 MG capsule Take 2 capsules (1,000 mg total) by mouth daily. May take with food to minimize GI side effects.  60 capsule  0  . levofloxacin (LEVAQUIN) 750 MG tablet Take 1 tablet (750 mg total) by mouth daily.  5 tablet  0  . lisinopril (PRINIVIL,ZESTRIL) 10 MG tablet Take 1 tablet (10 mg total) by mouth daily.  30 tablet  0  . metoprolol tartrate (LOPRESSOR) 25 MG tablet Take 1 tablet (25 mg total) by mouth daily.  [place]zier  tablet  6  . morphine (MS CONTIN) 30 MG 12 hr tablet Take 1 tablet (30 mg total) by mouth every 12 (twelve) hours.  60 tablet  0  . potassium chloride SA (K-DUR,KLOR-CON) 20 MEQ tablet Take 1 tablet (20 mEq total) by mouth every morning.  30 tablet  3  . rivaroxaban (XARELTO) 20 MG TABS tablet Take 1 tablet (20 mg total) by mouth every morning.  30 tablet  0  . zolpidem (AMBIEN) 10 MG tablet Take 1 tablet (10 mg total) by mouth at bedtime as needed for sleep.  30 tablet  0    No results found for this or any previous visit (from the past 48 hour(s)). No results found.  ROSAll systems negative except per HPI  There were no vitals taken for this visit. Physical Exam  General: stable, NAD HEENT: PERRL, EOMI, No Pallor, no Jaundice CVS: RRR Abd: Soft, NT, +BS Ext: No E/C/C  Assessment/Plan Patient admitted to the day hospital with sickle cell painful crisis 1. Sickle Cell Painful crisis: Patient will be admitted to the sickle cell  clinic day hospital. Will be given IV Dilaudid and Toradol and IV hydration as well as supportive care.   2. Dehydration: will hydrate aggressively.  [name],[name] 09/17/2013, 9:15 AM

## 2013-09-17 NOTE — Telephone Encounter (Signed)
Advised patient that I spoke with Dr. Jonelle Sidle, and it is ok for him to come to [facility].  Patient verbalizes understanding.

## 2013-09-17 NOTE — Discharge Instructions (Signed)
Sickle Cell Anemia, Adult °Sickle cell anemia is a condition in which red blood cells have an abnormal "sickle" shape. This abnormal shape shortens the cells' life span, which results in a lower than normal concentration of red blood cells in the blood. The sickle shape also causes the cells to clump together and block free blood flow through the blood vessels. As a result, the tissues and organs of the body do not receive enough oxygen. Sickle cell anemia causes organ damage and pain and increases the risk of infection. °CAUSES  °Sickle cell anemia is a genetic disorder. Those who receive two copies of the gene have the condition, and those who receive one copy have the trait. °RISK FACTORS °The sickle cell gene is most common in people whose families originated in [place]. Other areas of the globe where sickle cell trait occurs include the [place], [place] and [place], the [place], and the [place].  °SIGNS AND SYMPTOMS °· Pain, especially in the extremities, back, chest, or abdomen (common). The pain may start suddenly or may develop following an illness, especially if there is dehydration. Pain can also occur due to overexertion or exposure to extreme temperature changes. °· Frequent severe bacterial infections, especially certain types of pneumonia and meningitis. °· Pain and swelling in the hands and feet. °· Decreased activity.   °· Loss of appetite.   °· Change in behavior. °· Headaches. °· Seizures. °· Shortness of breath or difficulty breathing. °· Vision changes. °· Skin ulcers. °Those with the trait may not have symptoms or they may have mild symptoms.  °DIAGNOSIS  °Sickle cell anemia is diagnosed with blood tests that demonstrate the genetic trait. It is often diagnosed during the newborn period, due to mandatory testing nationwide. A variety of blood tests, X-rays, CT scans, MRI scans, ultrasounds, and lung function tests may also be done to monitor the condition. °TREATMENT  °Sickle  cell anemia may be treated with: °· Medicines. You may be given pain medicines, antibiotic medicines (to treat and prevent infections) or medicines to increase the production of certain types of hemoglobin. °· Fluids. °· Oxygen. °· Blood transfusions. °HOME CARE INSTRUCTIONS  °· Drink enough fluid to keep your urine clear or pale yellow. Increase your fluid intake in hot weather and during exercise. °· Do not smoke. Smoking lowers oxygen levels in the blood.   °· Only take over-the-counter or prescription medicines for pain, fever, or discomfort as directed by your health care provider. °· Take antibiotics as directed by your health care provider. Make sure you finish them it even if you start to feel better.   °· Take supplements as directed by your health care provider.   °· Consider wearing a medical alert bracelet. This tells anyone caring for you in an emergency of your condition.   °· When traveling, keep your medical information, health care provider's names, and the medicines you take with you at all times.   °· If you develop a fever, do not take medicines to reduce the fever right away. This could cover up a problem that is developing. Notify your health care provider. °· Keep all follow-up appointments with your health care provider. Sickle cell anemia requires regular medical care. °SEEK MEDICAL CARE IF: ° You have a fever. °SEEK IMMEDIATE MEDICAL CARE IF:  °· You feel dizzy or faint.   °· You have new abdominal pain, especially on the left side near the stomach area.   °· You develop a persistent, often uncomfortable and painful penile erection (priapism). If this is not treated immediately it   will lead to impotence.   °· You have numbness your arms or legs or you have a hard time moving them.   °· You have a hard time with speech.   °· You have a fever or persistent symptoms for more than 2-3 days.   °· You have a fever and your symptoms suddenly get worse.   °· You have signs or symptoms of infection.  These include:   °¨ Chills.   °¨ Abnormal tiredness (lethargy).   °¨ Irritability.   °¨ Poor eating.   °¨ Vomiting.   °· You develop pain that is not helped with medicine.   °· You develop shortness of breath. °· You have pain in your chest.   °· You are coughing up pus-like or bloody sputum.   °· You develop a stiff neck. °· Your feet or hands swell or have pain. °· Your abdomen appears bloated. °· You develop joint pain. °MAKE SURE YOU: °· Understand these instructions. °Document Released: 04/27/2005 Document Revised: 06/03/2013 Document Reviewed: 08/29/2012 °[employer]® Patient Information ©2015 [employer]. This information is not intended to replace advice given to you by your health care provider. Make sure you discuss any questions you have with your health care provider. ° °

## 2013-09-18 IMAGING — CT CT NECK W/ CM
4 of 5 series · 15 of 33 positions shown, 18 images · IV contrast ([ID] [id number])
Comparison: Port-A-Cath placement films 09/13/2010.

CLINICAL DATA: Right-sided neck pain.  Question abscess.  History
sickle cell crisis.

CT NECK WITH CONTRAST
TECHNIQUE: Multidetector CT imaging of the neck was performed with
intravenous contrast.
Contrast: 100mL OMNIPAQUE IOHEXOL 300 MG/ML IV SOLN

[Series 2: neck st · axial · 0.48mm/px · z∈[-748,-688]mm · 2 of 79 slices shown]
[im 20/79  bone]
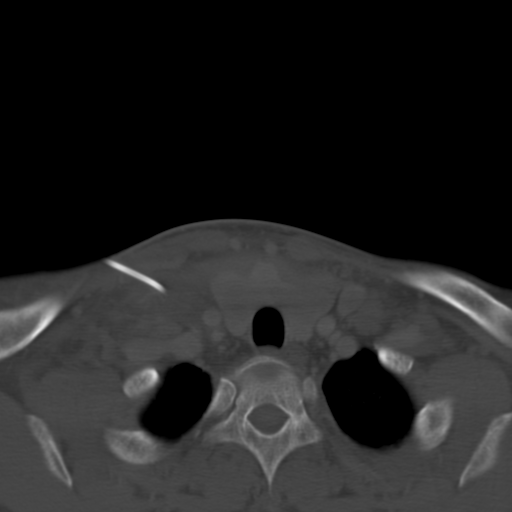
[im 40/79  bone]
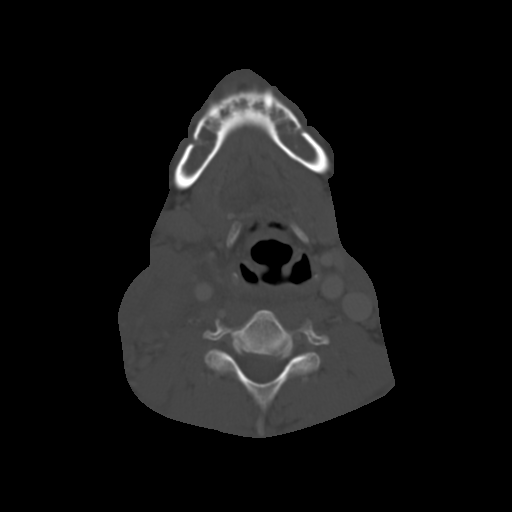

[Series 602: <mpr thick range> · axial · 0.48mm/px · z∈[-769,-616]mm · 5 of 115 slices shown, 7 images]
[im 20/115  soft-tissue]
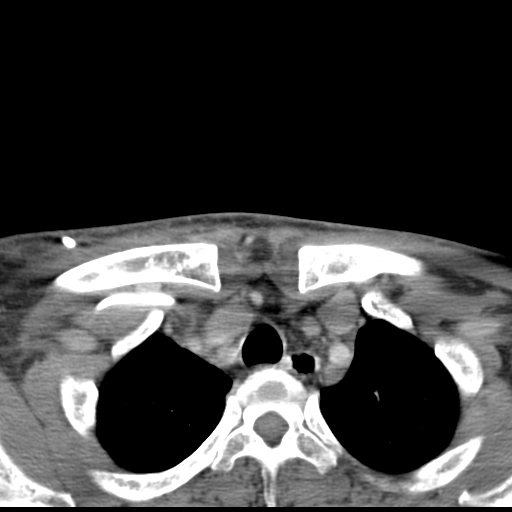
[im 20/115  bone]
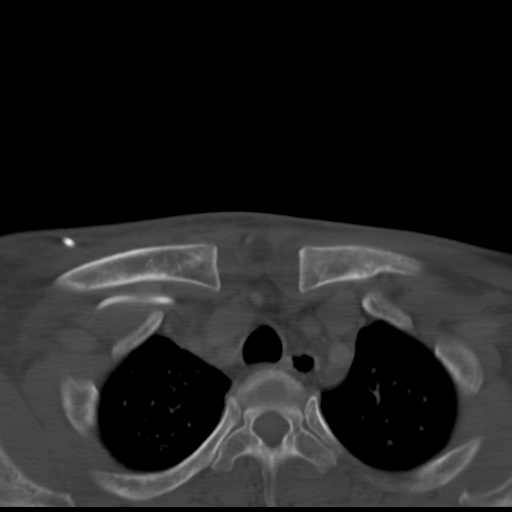
[im 39/115  bone]
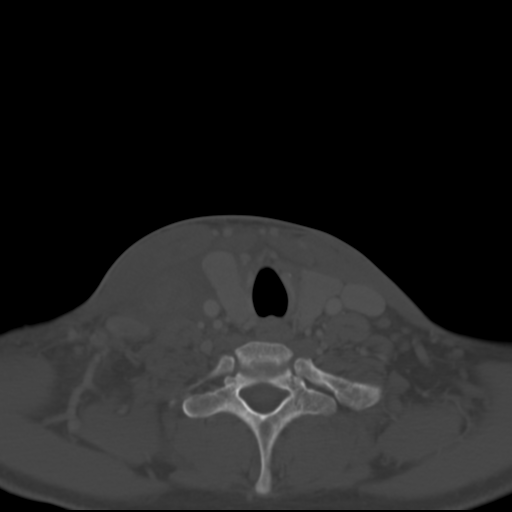
[im 58/115  bone]
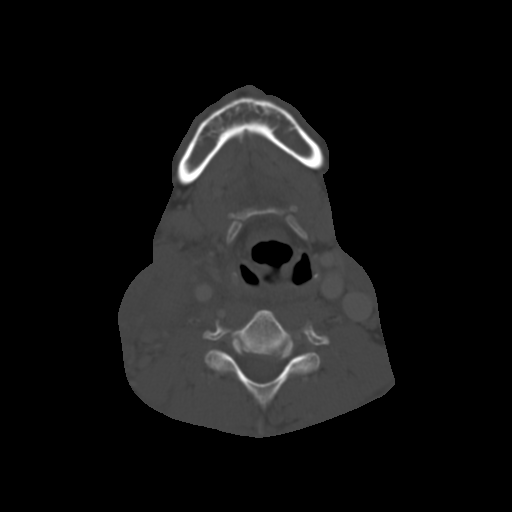
[im 77/115  bone]
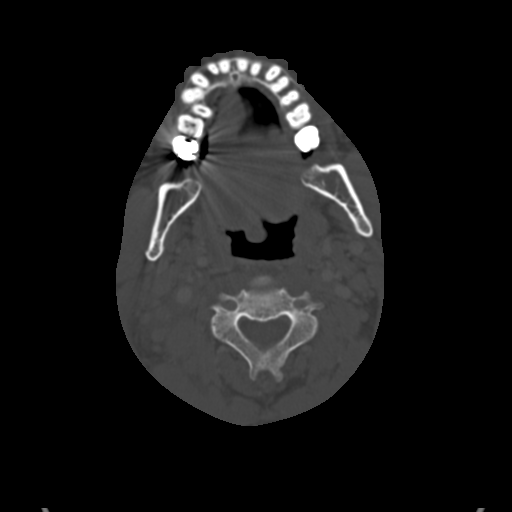
[im 96/115  soft-tissue]
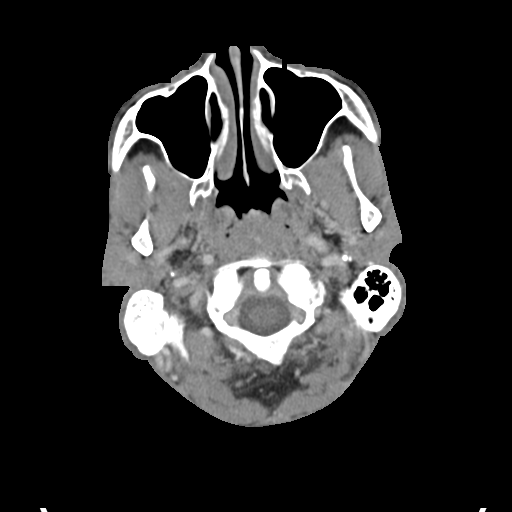
[im 96/115  bone]
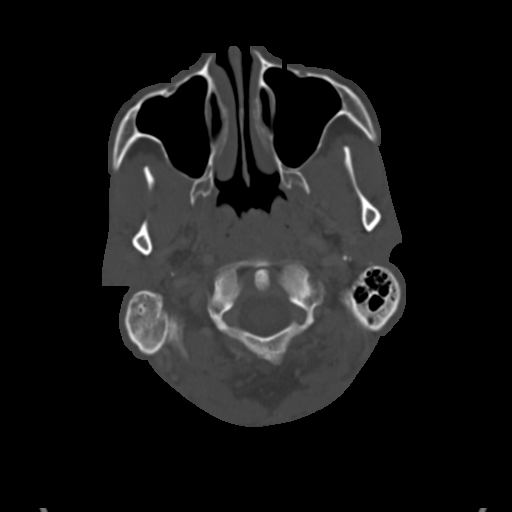

[Series 603: <mpr thick range(1)> · coronal · 0.48mm/px · 3 of 104 slices shown]
[im 21/104  bone]
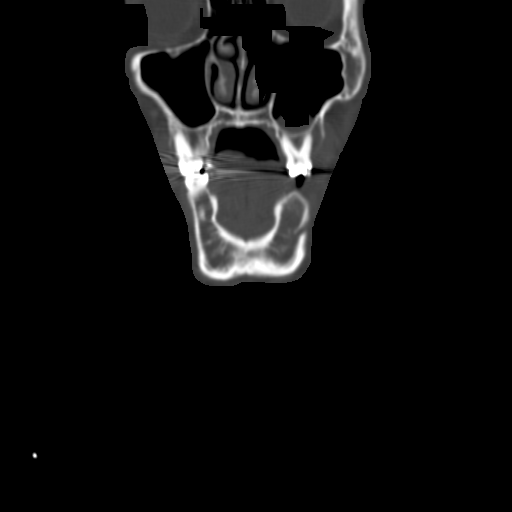
[im 42/104  bone]
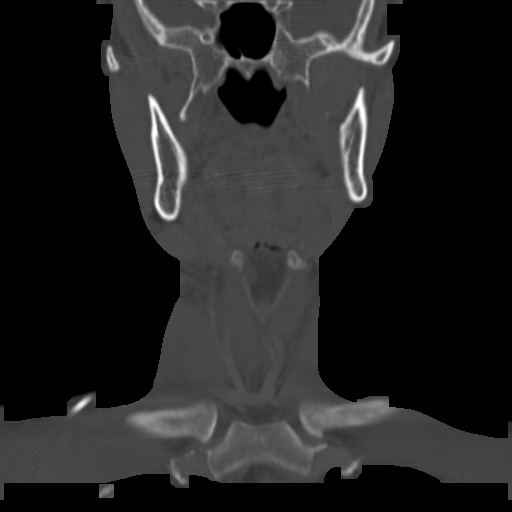
[im 62/104  bone]
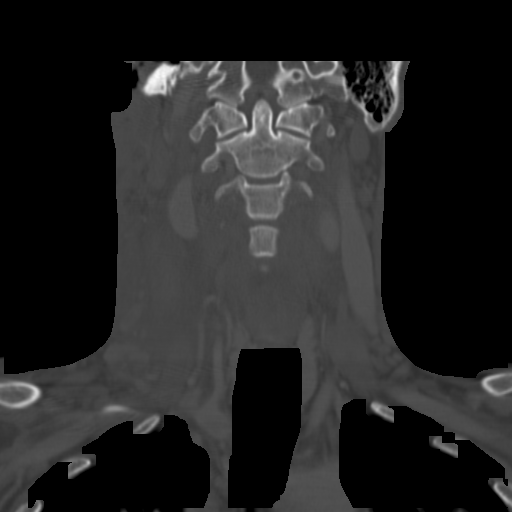

[Series 604: <mpr thick range(2)> · sagittal · 0.48mm/px · 5 of 85 slices shown, 6 images]
[im 29/85  bone]
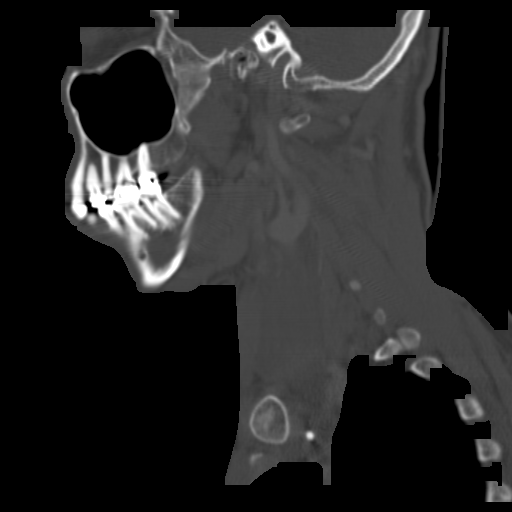
[im 36/85  bone]
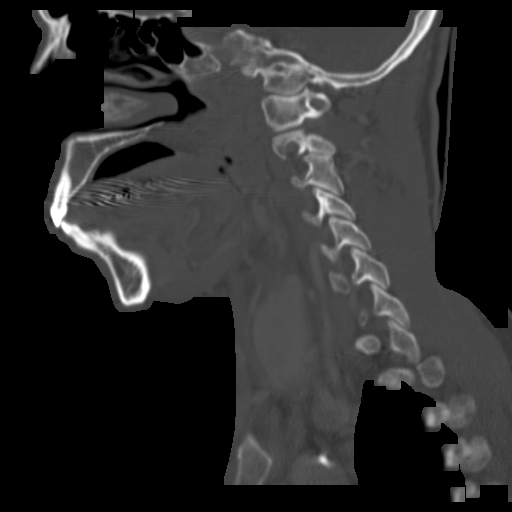
[im 43/85  soft-tissue]
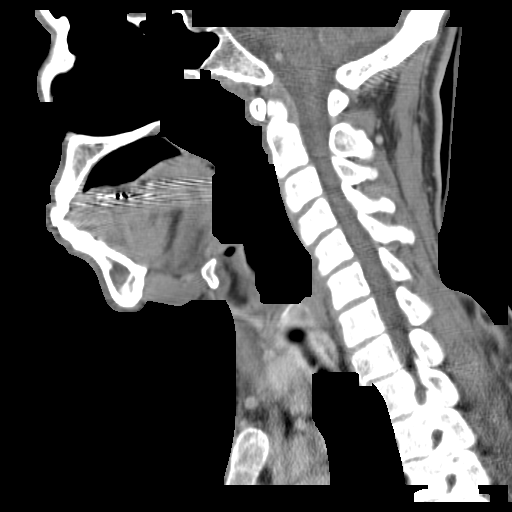
[im 43/85  bone]
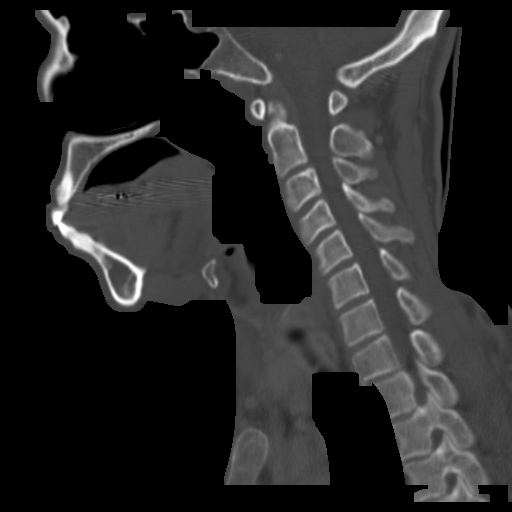
[im 50/85  bone]
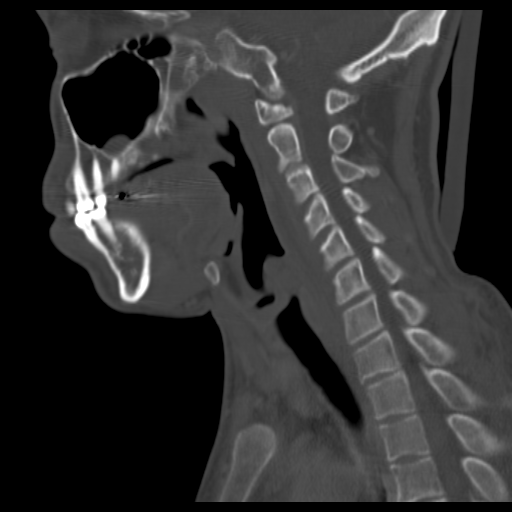
[im 57/85  bone]
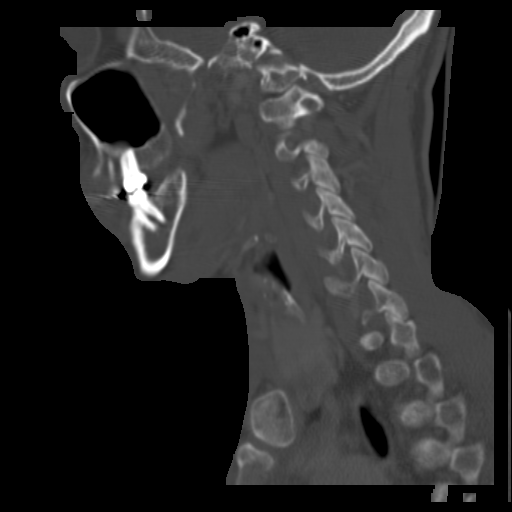

[15 of 33 positions shown; findings below may reference images not displayed]

FINDINGS: The right IJ Port-A-Cath is in place.  The tip has
retracted and now terminates at the level of the right subclavian
and innominate vein confluence.  A portion of the catheter has
looped back into the right subclavian vein.  The right internal
jugular vein is thrombosed above the level of the catheter.  There
is minimal contrast at the skull base but no significant contrast
below the skull base in the right internal jugular vein.  Extensive
inflammatory changes around the thrombosed vein.  There is several
small lymph nodes within the right neck.  Mild inflammatory changes
are also noted within the overlying sternocleidomastoid
musculature.   No other discrete mass lesion is present.

No significant mucosal or submucosal lesions are noted.  There is
slight shift of the larynx to the right due to mass effect from the
right IJ thrombophlebitis. Multiple smaller lymph nodes are present
throughout the left neck.

Numerous small level III and level IV lymph nodes are present
bilaterally.

The lung apices are clear.  The bone windows demonstrate
straightening of the normal cervical lordosis.  No focal lytic or
blastic lesions are evident.
IMPRESSION: 1.  Thrombosis of the right internal jugular vein above the level
of the right IJ catheter with extensive inflammatory changes
compatible with thrombophlebitis.
2.  Numerous bilateral cervical lymph nodes are likely reactive.
3.  A portion of the right IJ catheter has coiled into the right
subclavian vein proximal to the IJ thrombosis.  The tip is
retracted, now terminating at the level of the confluence of the
right internal jugular vein and innominate veins.

## 2013-09-22 ENCOUNTER — Encounter (HOSPITAL_COMMUNITY): Payer: Self-pay | Admitting: Emergency Medicine

## 2013-09-22 ENCOUNTER — Emergency Department (HOSPITAL_COMMUNITY): Payer: Medicare Other

## 2013-09-22 ENCOUNTER — Inpatient Hospital Stay (HOSPITAL_COMMUNITY)
Admission: EM | Admit: 2013-09-22 | Discharge: 2013-09-24 | DRG: 812 | Disposition: A | Discharge status: Home / Self Care | Payer: Medicare Other | Attending: Internal Medicine | Admitting: Internal Medicine

## 2013-09-22 DIAGNOSIS — Z832 Family history of diseases of the blood and blood-forming organs and certain disorders involving the immune mechanism: Secondary | ICD-10-CM

## 2013-09-22 DIAGNOSIS — I2789 Other specified pulmonary heart diseases: Secondary | ICD-10-CM | POA: Diagnosis present

## 2013-09-22 DIAGNOSIS — F39 Unspecified mood [affective] disorder: Secondary | ICD-10-CM | POA: Diagnosis not present

## 2013-09-22 DIAGNOSIS — D5700 Hb-SS disease with crisis, unspecified: Principal | ICD-10-CM | POA: Diagnosis present

## 2013-09-22 DIAGNOSIS — Z86718 Personal history of other venous thrombosis and embolism: Secondary | ICD-10-CM

## 2013-09-22 DIAGNOSIS — Z86711 Personal history of pulmonary embolism: Secondary | ICD-10-CM | POA: Diagnosis present

## 2013-09-22 DIAGNOSIS — Z96649 Presence of unspecified artificial hip joint: Secondary | ICD-10-CM

## 2013-09-22 DIAGNOSIS — Z7901 Long term (current) use of anticoagulants: Secondary | ICD-10-CM | POA: Diagnosis not present

## 2013-09-22 DIAGNOSIS — Z885 Allergy status to narcotic agent status: Secondary | ICD-10-CM | POA: Diagnosis not present

## 2013-09-22 DIAGNOSIS — I10 Essential (primary) hypertension: Secondary | ICD-10-CM

## 2013-09-22 DIAGNOSIS — Z79899 Other long term (current) drug therapy: Secondary | ICD-10-CM | POA: Diagnosis not present

## 2013-09-22 DIAGNOSIS — L299 Pruritus, unspecified: Secondary | ICD-10-CM | POA: Diagnosis not present

## 2013-09-22 DIAGNOSIS — R0902 Hypoxemia: Secondary | ICD-10-CM | POA: Diagnosis not present

## 2013-09-22 DIAGNOSIS — D649 Anemia, unspecified: Secondary | ICD-10-CM | POA: Diagnosis present

## 2013-09-22 DIAGNOSIS — Z87891 Personal history of nicotine dependence: Secondary | ICD-10-CM

## 2013-09-22 DIAGNOSIS — D473 Essential (hemorrhagic) thrombocythemia: Secondary | ICD-10-CM | POA: Diagnosis present

## 2013-09-22 DIAGNOSIS — R079 Chest pain, unspecified: Secondary | ICD-10-CM | POA: Diagnosis not present

## 2013-09-22 DIAGNOSIS — R52 Pain, unspecified: Secondary | ICD-10-CM | POA: Diagnosis not present

## 2013-09-22 DIAGNOSIS — D72829 Elevated white blood cell count, unspecified: Secondary | ICD-10-CM | POA: Diagnosis present

## 2013-09-22 LAB — CBC WITH DIFFERENTIAL/PLATELET
BASOS ABS: 0.2 10*3/uL — AB (ref 0.0–0.1)
Basophils Relative: 1 % (ref 0–1)
Eosinophils Absolute: 0.6 10*3/uL (ref 0.0–0.7)
Eosinophils Relative: 2 % (ref 0–5)
HCT: 13.9 % — ABNORMAL LOW (ref 39.0–52.0)
Hemoglobin: 4.9 g/dL — CL (ref 13.0–17.0)
LYMPHS ABS: 4.7 10*3/uL — AB (ref 0.7–4.0)
Lymphocytes Relative: 19 % (ref 12–46)
MCH: 32.2 pg (ref 26.0–34.0)
MCHC: 35.3 g/dL (ref 30.0–36.0)
MCV: 91.4 fL (ref 78.0–100.0)
Monocytes Absolute: 2.8 10*3/uL — ABNORMAL HIGH (ref 0.1–1.0)
Monocytes Relative: 12 % (ref 3–12)
NEUTROS ABS: 15.9 10*3/uL — AB (ref 1.7–7.7)
NEUTROS PCT: 66 % (ref 43–77)
PLATELETS: 561 10*3/uL — AB (ref 150–400)
RBC: 1.52 MIL/uL — ABNORMAL LOW (ref 4.22–5.81)
RDW: 21.3 % — AB (ref 11.5–15.5)
WBC: 24.1 10*3/uL — ABNORMAL HIGH (ref 4.0–10.5)

## 2013-09-22 LAB — IRON AND TIBC
IRON: 157 ug/dL — AB (ref 42–135)
UIBC: <15 ug/dL — ABNORMAL LOW (ref 125–400)

## 2013-09-22 LAB — URINALYSIS, ROUTINE W REFLEX MICROSCOPIC
Bilirubin Urine: NEGATIVE
Glucose, UA: NEGATIVE mg/dL
Hgb urine dipstick: NEGATIVE
Ketones, ur: NEGATIVE mg/dL
LEUKOCYTES UA: NEGATIVE
NITRITE: NEGATIVE
PH: 6 (ref 5.0–8.0)
Protein, ur: NEGATIVE mg/dL
Specific Gravity, Urine: 1.013 (ref 1.005–1.030)
Urobilinogen, UA: 1 mg/dL (ref 0.0–1.0)

## 2013-09-22 LAB — FOLATE: Folate: 10.8 ng/mL

## 2013-09-22 LAB — BASIC METABOLIC PANEL
ANION GAP: 12 (ref 5–15)
BUN: 9 mg/dL (ref 6–23)
CALCIUM: 8.8 mg/dL (ref 8.4–10.5)
CHLORIDE: 106 meq/L (ref 96–112)
CO2: 23 meq/L (ref 19–32)
Creatinine, Ser: 0.71 mg/dL (ref 0.50–1.35)
GFR calc Af Amer: >90 mL/min (ref >90)
GFR calc non Af Amer: >90 mL/min (ref >90)
Glucose, Bld: 115 mg/dL — ABNORMAL HIGH (ref 70–99)
POTASSIUM: 3.9 meq/L (ref 3.7–5.3)
SODIUM: 141 meq/L (ref 137–147)

## 2013-09-22 LAB — PREPARE RBC (CROSSMATCH)

## 2013-09-22 LAB — VITAMIN B12: VITAMIN B 12: 761 pg/mL (ref 211–911)

## 2013-09-22 LAB — PROTIME-INR
INR: 1.34 (ref 0.00–1.49)
Prothrombin Time: 16.6 seconds — ABNORMAL HIGH (ref 11.6–15.2)

## 2013-09-22 LAB — RETICULOCYTES
RBC.: 1.52 MIL/uL — ABNORMAL LOW (ref 4.22–5.81)
RETIC COUNT ABSOLUTE: 243.2 10*3/uL — AB (ref 19.0–186.0)
RETIC CT PCT: 16 % — AB (ref 0.4–3.1)

## 2013-09-22 LAB — FERRITIN: Ferritin: 2451 ng/mL — ABNORMAL HIGH (ref 22–322)

## 2013-09-22 MED ORDER — DIPHENHYDRAMINE HCL 50 MG/ML IJ SOLN: 12.5000 mg | Freq: Four times a day (QID) | INTRAMUSCULAR | Status: DC | PRN

## 2013-09-22 MED ORDER — FOLIC ACID 1 MG PO TABS
1.0000 mg | ORAL_TABLET | Freq: Every morning | ORAL | Status: DC
  Administered 2013-09-22 – 2013-09-24 (×3): 1 mg via ORAL
  Filled 2013-09-22 (×3): qty 1

## 2013-09-22 MED ORDER — ZOLPIDEM TARTRATE 5 MG PO TABS
5.0000 mg | ORAL_TABLET | Freq: Once | ORAL | Status: AC
  Administered 2013-09-22: 5 mg via ORAL
  Filled 2013-09-22: qty 1

## 2013-09-22 MED ORDER — HYDROMORPHONE 0.3 MG/ML IV SOLN
INTRAVENOUS | Status: DC
  Administered 2013-09-22: 0.3 mg via INTRAVENOUS
  Administered 2013-09-22: 3.61 mg via INTRAVENOUS
  Administered 2013-09-22: 10:00:00 via INTRAVENOUS
  Administered 2013-09-22: 5.49 mg via INTRAVENOUS
  Administered 2013-09-22: 15:00:00 via INTRAVENOUS
  Administered 2013-09-22: 3.99 mg via INTRAVENOUS
  Administered 2013-09-23: 11 mg via INTRAVENOUS
  Administered 2013-09-23: 2.39 mg via INTRAVENOUS
  Administered 2013-09-23: 2.1 mg via INTRAVENOUS
  Administered 2013-09-23: 1.02 mg via INTRAVENOUS
  Administered 2013-09-23: 12:00:00 via INTRAVENOUS
  Administered 2013-09-23: 2.7 mg via INTRAVENOUS
  Administered 2013-09-24: 0.3 mg via INTRAVENOUS
  Administered 2013-09-24: 2.16 mg via INTRAVENOUS
  Administered 2013-09-24: 4.8 mg via INTRAVENOUS
  Filled 2013-09-22 (×6): qty 25

## 2013-09-22 MED ORDER — DIPHENHYDRAMINE HCL 50 MG/ML IJ SOLN
25.0000 mg | Freq: Once | INTRAMUSCULAR | Status: AC
  Administered 2013-09-22: 25 mg via INTRAVENOUS
  Filled 2013-09-22: qty 1

## 2013-09-22 MED ORDER — SODIUM CHLORIDE 0.9 % IJ SOLN: 10.0000 mL | INTRAMUSCULAR | Status: DC | PRN

## 2013-09-22 MED ORDER — SODIUM CHLORIDE 0.9 % IV SOLN
1000.0000 mL | INTRAVENOUS | Status: DC
  Administered 2013-09-22: 1000 mL via INTRAVENOUS

## 2013-09-22 MED ORDER — HYDROXYUREA 500 MG PO CAPS: 1000.0000 mg | ORAL_CAPSULE | Freq: Every day | ORAL | Status: DC

## 2013-09-22 MED ORDER — RIVAROXABAN 20 MG PO TABS
20.0000 mg | ORAL_TABLET | Freq: Every day | ORAL | Status: DC
Start: 2013-09-23 — End: 2013-09-24
  Administered 2013-09-23: 20 mg via ORAL
  Filled 2013-09-22 (×2): qty 1

## 2013-09-22 MED ORDER — ONDANSETRON HCL 4 MG/2ML IJ SOLN: 4.0000 mg | Freq: Four times a day (QID) | INTRAMUSCULAR | Status: DC | PRN

## 2013-09-22 MED ORDER — DIPHENHYDRAMINE HCL 12.5 MG/5ML PO ELIX
12.5000 mg | ORAL_SOLUTION | Freq: Four times a day (QID) | ORAL | Status: DC | PRN
  Administered 2013-09-23: 12.5 mg via ORAL
  Filled 2013-09-22: qty 5

## 2013-09-22 MED ORDER — NALOXONE HCL 0.4 MG/ML IJ SOLN
0.4000 mg | INTRAMUSCULAR | Status: DC | PRN
Start: 2013-09-22 — End: 2013-09-24

## 2013-09-22 MED ORDER — SODIUM CHLORIDE 0.9 % IV SOLN: INTRAVENOUS | Status: DC

## 2013-09-22 MED ORDER — HYDROMORPHONE HCL PF 1 MG/ML IJ SOLN: 1.0000 mg | Freq: Once | INTRAMUSCULAR | Status: DC

## 2013-09-22 MED ORDER — RIVAROXABAN 20 MG PO TABS
20.0000 mg | ORAL_TABLET | Freq: Every morning | ORAL | Status: DC
  Filled 2013-09-22: qty 1

## 2013-09-22 MED ORDER — HYDROMORPHONE HCL PF 2 MG/ML IJ SOLN: 2.0000 mg | Freq: Once | INTRAMUSCULAR | Status: DC

## 2013-09-22 MED ORDER — SODIUM CHLORIDE 0.9 % IJ SOLN
10.0000 mL | Freq: Two times a day (BID) | INTRAMUSCULAR | Status: DC
  Administered 2013-09-23 – 2013-09-24 (×2): 10 mL

## 2013-09-22 MED ORDER — SODIUM CHLORIDE 0.9 % IJ SOLN: 9.0000 mL | INTRAMUSCULAR | Status: DC | PRN

## 2013-09-22 MED ORDER — ACETAMINOPHEN 325 MG PO TABS: 650.0000 mg | ORAL_TABLET | Freq: Four times a day (QID) | ORAL | Status: DC | PRN

## 2013-09-22 MED ORDER — ACETAMINOPHEN 650 MG RE SUPP: 650.0000 mg | Freq: Four times a day (QID) | RECTAL | Status: DC | PRN

## 2013-09-22 MED ORDER — MORPHINE SULFATE ER 30 MG PO TBCR
30.0000 mg | EXTENDED_RELEASE_TABLET | Freq: Two times a day (BID) | ORAL | Status: DC
  Administered 2013-09-22 – 2013-09-24 (×5): 30 mg via ORAL
  Filled 2013-09-22 (×5): qty 1

## 2013-09-22 MED ORDER — SODIUM CHLORIDE 0.9 % IV SOLN
1000.0000 mL | Freq: Once | INTRAVENOUS | Status: AC
  Administered 2013-09-22: 1000 mL via INTRAVENOUS

## 2013-09-22 MED ORDER — CELECOXIB 200 MG PO CAPS
200.0000 mg | ORAL_CAPSULE | Freq: Two times a day (BID) | ORAL | Status: DC
  Administered 2013-09-22 – 2013-09-23 (×3): 200 mg via ORAL
  Filled 2013-09-22 (×4): qty 1

## 2013-09-22 MED ORDER — DIPHENHYDRAMINE HCL 12.5 MG/5ML PO ELIX: 12.5000 mg | ORAL_SOLUTION | Freq: Four times a day (QID) | ORAL | Status: DC | PRN

## 2013-09-22 MED ORDER — SENNA 8.6 MG PO TABS
1.0000 | ORAL_TABLET | Freq: Two times a day (BID) | ORAL | Status: DC
  Administered 2013-09-22 – 2013-09-23 (×2): 8.6 mg via ORAL
  Filled 2013-09-22 (×5): qty 1

## 2013-09-22 MED ORDER — SODIUM CHLORIDE 0.9 % IV SOLN
1000.0000 mL | INTRAVENOUS | Status: DC
  Administered 2013-09-22 – 2013-09-23 (×3): 1000 mL via INTRAVENOUS

## 2013-09-22 MED ORDER — POLYETHYLENE GLYCOL 3350 17 G PO PACK
17.0000 g | PACK | Freq: Every day | ORAL | Status: DC | PRN
  Filled 2013-09-22: qty 1

## 2013-09-22 MED ORDER — METOPROLOL TARTRATE 25 MG PO TABS
25.0000 mg | ORAL_TABLET | Freq: Every day | ORAL | Status: DC
  Administered 2013-09-22 – 2013-09-24 (×3): 25 mg via ORAL
  Filled 2013-09-22 (×3): qty 1

## 2013-09-22 MED ORDER — LISINOPRIL 10 MG PO TABS
10.0000 mg | ORAL_TABLET | Freq: Every day | ORAL | Status: DC
  Administered 2013-09-22 – 2013-09-24 (×3): 10 mg via ORAL
  Filled 2013-09-22 (×3): qty 1

## 2013-09-22 MED ORDER — HYDROMORPHONE HCL PF 2 MG/ML IJ SOLN
2.0000 mg | INTRAMUSCULAR | Status: AC | PRN
  Administered 2013-09-22 (×3): 2 mg via INTRAVENOUS
  Filled 2013-09-22 (×3): qty 1

## 2013-09-22 MED ORDER — SODIUM CHLORIDE 0.9 % IV SOLN
Freq: Once | INTRAVENOUS | Status: AC
  Administered 2013-09-22: 09:00:00 via INTRAVENOUS

## 2013-09-22 NOTE — H&P (Signed)
[facility] History and Physical  [name] YBO:[id number] DOB: 11-07-1980 DOA: 09/22/2013  Referring physician: EDP PCP: Gerald A., [name]   Chief Complaint: pain in sides  HPI: [name] is a 33 y.o. male with PMH of Sickle cell disease, Chronic anemia due to same, H/o PE on Xarelto, Pulm HTN presents to the ER with the above complaints. He reports being in his usual state of health till last weel. About 6-7days ago started having pain in his sides/ribs which he usually has in the setting of crises and leg pains too. He has been self medicating with PO dilaudid and MS COntin this week only to moderate  Relief, since the pain worsened he presented to the ER today. In addition also reports dyspnea on exertion for 1 week now. In ER, noted to have Hb of 4.9 and acute pain crises No h/o hematemesis or melena   Review of Systems: positives bolded Constitutional:  No weight loss, night sweats, Fevers, chills, fatigue.  HEENT:  No headaches, Difficulty swallowing,Tooth/dental problems,Sore throat,  No sneezing, itching, ear ache, nasal congestion, post nasal drip,  Cardio-vascular:  No chest pain, Orthopnea, PND, swelling in lower extremities, anasarca, dizziness, palpitations  GI:  No heartburn, indigestion, abdominal pain, nausea, vomiting, diarrhea, change in bowel habits, loss of appetite  Resp:  shortness of breath with exertion or at rest. No excess mucus, no productive cough, No non-productive cough, No coughing up of blood.No change in color of mucus.No wheezing.No chest wall deformity  Skin:  no rash or lesions.  GU:  no dysuria, change in color of urine, no urgency or frequency. No flank pain.  Musculoskeletal: pain in legs and lateral chest wall No joint pain or swelling. No decreased range of motion. No back pain.  Psych:  No change in mood or affect. No depression or anxiety. No memory loss.   Past Medical History  Diagnosis Date  . Sickle  cell anemia   . Blood transfusion   . 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 Hypokalemia   . Mood disorder   . History of pulmonary embolus (PE)   . Avascular necrosis   . Leukocytosis     Chronic  . Thrombocytosis     Chronic  . Hypertension   . History of Clostridium difficile infection   . Uses marijuana   . Chronic anticoagulation   . Functional asplenia   . Former smoker   . Second hand tobacco smoke exposure   . Alcohol consumption of one to four drinks per day    Past Surgical History  Procedure Laterality Date  . Right hip replacement      08/2006  . Cholecystectomy      01/2008  . Porta cath placement    . Porta cath removal    . Umbilical hernia repair      01/2008  . Excision of left periauricular cyst      10/2009  . Excision of right ear lobe cyst with primary closur      11/2007  . Portacath placement  01/05/2012    Procedure: INSERTION PORT-A-CATH;  Surgeon: Gerald Hollingshead, [name];  Location: [facility];  Service: General;  Laterality: N/A;  ultrasound guiced port a cath insertion with fluoroscopy   Social History:  reports that he quit smoking about 3 years ago. He has never used smokeless tobacco. He reports that he uses illicit drugs about twice per week. He reports that he does not  drink alcohol.  Allergies  Allergen Reactions  . Morphine And Related Hives and Rash    Pt states he can take PO, but not IV and he is able to tolerate Dilaudid with no reactions.    Family History  Problem Relation Age of Onset  . Sickle cell anemia Mother   . Sickle cell anemia Father   . Sickle cell trait Brother      Prior to Admission medications   Medication Sig Start Date End Date Taking? Authorizing Provider  celecoxib (CELEBREX) 200 MG capsule Take 1 capsule (200 mg total) by mouth 2 (two) times daily. 07/31/13  Yes Gerald Dew, [name]  folic acid (FOLVITE) 1 MG tablet Take 1 tablet (1 mg total) by mouth every morning. 08/22/13  Yes Gerald Gamer, [name]  HYDROmorphone (DILAUDID) 4 MG tablet Take 1 tablet (4 mg total) by mouth every 4 (four) hours as needed for severe pain. 08/22/13  Yes Gerald Gamer, [name]  hydroxyurea (HYDREA) 500 MG capsule Take 2 capsules (1,000 mg total) by mouth daily. May take with food to minimize GI side effects. 08/29/13  Yes Gerald Reach, [name]  lisinopril (PRINIVIL,ZESTRIL) 10 MG tablet Take 1 tablet (10 mg total) by mouth daily. 08/22/13  Yes Gerald Gamer, [name]  metoprolol tartrate (LOPRESSOR) 25 MG tablet Take 1 tablet (25 mg total) by mouth daily. 08/22/13  Yes Gerald Gamer, [name]  morphine (MS CONTIN) 30 MG 12 hr tablet Take 1 tablet (30 mg total) by mouth every 12 (twelve) hours. 09/03/13  Yes Gerald Gamer, [name]  potassium chloride SA (K-DUR,KLOR-CON) 20 MEQ tablet Take 1 tablet (20 mEq total) by mouth every morning. 07/31/13  Yes Gerald Dew, [name]  rivaroxaban (XARELTO) 20 MG TABS tablet Take 1 tablet (20 mg total) by mouth every morning. 08/22/13  Yes Gerald Gamer, [name]  zolpidem (AMBIEN) 10 MG tablet Take 1 tablet (10 mg total) by mouth at bedtime as needed for sleep. 08/22/13  Yes Gerald Gamer, [name]   Physical Exam: Filed Vitals:   09/22/13 0933 09/22/13 1029 09/22/13 1111 09/22/13 1140  BP: 123/73  102/66 103/54  Pulse: 85  68 88  Temp: 97.6 F (36.4 C)  97.2 F (36.2 C) 97.5 F (36.4 C)  TempSrc: Oral  Axillary Oral  Resp: 18 20 15 20   Height:      Weight:      SpO2: 99% 92% 94% 93%    Wt Readings from Last 3 Encounters:  09/22/13 74.707 kg (164 lb 11.2 oz)  09/03/13 73.936 kg (163 lb)  08/29/13 77.4 kg (170 lb 10.2 oz)    General:  AAOx3, no distress Eyes: PERRL, normal lids, irises & conjunctiva ENT: grossly normal  lips & tongue Neck: no LAD, masses or thyromegaly Cardiovascular: RRR, no m/r/g. No LE edema. Chest: port noted on Left upper chest wall Respiratory: CTA bilaterally, no w/r/r. Normal respiratory effort. Abdomen: soft, ntnd Skin: no  rash or induration seen on limited exam Musculoskeletal: grossly normal tone BUE/BLE Psychiatric: grossly normal mood and affect, speech fluent and appropriate Neurologic: grossly non-focal.          Labs on Admission:  Basic Metabolic Panel:  Recent Labs Lab 09/17/13 1000 09/22/13 0556  NA 139 141  K 3.8 3.9  CL 105 106  CO2 21 23  GLUCOSE 95 115*  BUN 7 9  CREATININE 0.60 0.71  CALCIUM 9.1 8.8   Liver Function Tests:  Recent Labs Lab 09/17/13  1000  AST 39*  ALT 23  ALKPHOS 86  BILITOT 7.4*  PROT 7.4  ALBUMIN 4.1   No results found for this basename: LIPASE, AMYLASE,  in the last 168 hours No results found for this basename: AMMONIA,  in the last 168 hours CBC:  Recent Labs Lab 09/17/13 1000 09/22/13 0556  WBC 13.9* 24.1*  NEUTROABS 10.7* 15.9*  HGB 6.8* 4.9*  HCT 19.2* 13.9*  MCV 91.9 91.4  PLT 595* 561*   Cardiac Enzymes: No results found for this basename: CKTOTAL, CKMB, CKMBINDEX, TROPONINI,  in the last 168 hours  BNP (last 3 results)  Recent Labs  11/13/12 1826  PROBNP 106.3   CBG: No results found for this basename: GLUCAP,  in the last 168 hours  Radiological Exams on Admission: Dg Chest 2 View  09/22/2013   CLINICAL DATA:  Chest pain.  EXAM: CHEST  2 VIEW  COMPARISON:  August 28, 2013.  FINDINGS: Stable cardiomediastinal silhouette. Left subclavian Port-A-Cath is noted with distal tip in expected position of the SVC. No pneumothorax is noted. Stable interstitial densities are noted throughout both lungs most consistent with scarring. No acute pulmonary disease is noted. Bony thorax is intact.  IMPRESSION: No acute cardiopulmonary abnormality seen.   Electronically Signed   By: Sabino Dick M.D.   On: 09/22/2013 07:24     Assessment/Plan  Anemia/acute on chronic -due to SCD -with dyspnea on exertion -baseline 6.8-7, now Hb of 4.9, 2gms lower than baseline -type and screen and transfuse 2 units PRBC  Acute sickle cell crises -start  Dilaudid PCA based on prior requirements, MSContin, Hydrea, folic acid -PRBC -FOlic acid -Bowel regimen -O2, IVF    Hx of pulmonary embolus -continue xarelto    Leukocytosis, unspecified -likely reactive, monitor, has chronic leukocytosis but WBC higher than baseline, afebrile  Code Status: Full Code DVT Prophylaxis: on xarelto Family Communication: none at bedside Disposition Plan: inpatient  Time spent: 25min  Willy Vorce [facility] Pager [phone]  **Disclaimer: This note may have been dictated with voice recognition software. Similar sounding words can inadvertently be transcribed and this note may contain transcription errors which may not have been corrected upon publication of note.**

## 2013-09-22 NOTE — ED Provider Notes (Signed)
CSN: [id number]     Arrival date & time 09/22/13  0431 History   First MD Initiated Contact with Patient 09/22/13 [phone]     Chief Complaint  Patient presents with  . Chest Pain     (Consider location/radiation/quality/duration/timing/severity/associated sxs/prior Treatment) HPI [name] is a 33 year old male with past medical history of sickle cell anemia with frequent admissions approximately 2-3 times per month who presents today with approximately 2 weeks of bilateral rib pain in his axilla region, dyspnea on exertion. Patient was seen in the day hospital on 09/17/13 for similar symptoms, and patient states his symptoms did not resolve since then. He states his pain and dyspnea have been gradual in onset, resting relieves his dyspnea, and nothing relieves his pain. Patient states he has tried his home meds including MS Contin and Dilaudid, with minimal relief. He denies nausea, vomiting, diarrhea, fever, weakness, dizziness, palpitations.   Past Medical History  Diagnosis Date  . Sickle cell anemia   . Blood transfusion   . 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 Hypokalemia   . Mood disorder   . History of pulmonary embolus (PE)   . Avascular necrosis   . Leukocytosis     Chronic  . Thrombocytosis     Chronic  . Hypertension   . History of Clostridium difficile infection   . Uses marijuana   . Chronic anticoagulation   . Functional asplenia   . Former smoker   . Second hand tobacco smoke exposure   . Alcohol consumption of one to four drinks per day    Past Surgical History  Procedure Laterality Date  . Right hip replacement      08/2006  . Cholecystectomy      01/2008  . Porta cath placement    . Porta cath removal    . Umbilical hernia repair      01/2008  . Excision of left periauricular cyst      10/2009  . Excision of right ear lobe cyst with primary closur      11/2007  . Portacath placement  01/05/2012    Procedure: INSERTION  PORT-A-CATH;  Surgeon: Odis Hollingshead, MD;  Location: [facility];  Service: General;  Laterality: N/A;  ultrasound guiced port a cath insertion with fluoroscopy   Family History  Problem Relation Age of Onset  . Sickle cell anemia Mother   . Sickle cell anemia Father   . Sickle cell trait Brother    History  Substance Use Topics  . Smoking status: Former Smoker -- 13 years    Quit date: 07/08/2010  . Smokeless tobacco: Never Used  . Alcohol Use: No    Review of Systems  Constitutional: Negative for fever and chills.  HENT: Negative for trouble swallowing and voice change.   Eyes: Negative for visual disturbance.  Respiratory: Positive for shortness of breath. Negative for cough and chest tightness.   Cardiovascular: Positive for chest pain. Negative for palpitations.  Gastrointestinal: Negative for nausea, vomiting, abdominal pain, diarrhea, blood in stool and anal bleeding.  Genitourinary: Negative for dysuria and hematuria.  Musculoskeletal: Negative for neck pain.  Skin: Negative for rash.  Neurological: Negative for dizziness, weakness and numbness.  Psychiatric/Behavioral: Negative.       Allergies  Morphine and related  Home Medications   Prior to Admission medications   Medication Sig Start Date End Date Taking? Authorizing Provider  celecoxib (CELEBREX) 200 MG capsule Take 1 capsule (200 mg total)  by mouth 2 (two) times daily. 07/31/13  Yes Dorena Dew, FNP  folic acid (FOLVITE) 1 MG tablet Take 1 tablet (1 mg total) by mouth every morning. 08/22/13  Yes [name], MD  HYDROmorphone (DILAUDID) 4 MG tablet Take 1 tablet (4 mg total) by mouth every 4 (four) hours as needed for severe pain. 08/22/13  Yes [name], MD  hydroxyurea (HYDREA) 500 MG capsule Take 2 capsules (1,000 mg total) by mouth daily. May take with food to minimize GI side effects. 08/29/13  Yes Elwyn Reach, MD  lisinopril (PRINIVIL,ZESTRIL) 10 MG tablet Take 1 tablet (10 mg  total) by mouth daily. 08/22/13  Yes [name], MD  metoprolol tartrate (LOPRESSOR) 25 MG tablet Take 1 tablet (25 mg total) by mouth daily. 08/22/13  Yes [name], MD  morphine (MS CONTIN) 30 MG 12 hr tablet Take 1 tablet (30 mg total) by mouth every 12 (twelve) hours. 09/03/13  Yes [name], MD  potassium chloride SA (K-DUR,KLOR-CON) 20 MEQ tablet Take 1 tablet (20 mEq total) by mouth every morning. 07/31/13  Yes Dorena Dew, FNP  rivaroxaban (XARELTO) 20 MG TABS tablet Take 1 tablet (20 mg total) by mouth every morning. 08/22/13  Yes [name], MD  zolpidem (AMBIEN) 10 MG tablet Take 1 tablet (10 mg total) by mouth at bedtime as needed for sleep. 08/22/13  Yes [name], MD   BP 112/74  Pulse 80  Temp(Src) 97.9 F (36.6 C) (Oral)  Resp 16  Ht 6' (1.829 m)  Wt 164 lb 11.2 oz (74.707 kg)  BMI 22.33 kg/m2  SpO2 96% Physical Exam  Constitutional: He is oriented to person, place, and time. He appears well-developed and well-nourished. No distress.  HENT:  Head: Normocephalic and atraumatic.  Mouth/Throat: Oropharynx is clear and moist. No oropharyngeal exudate.  Eyes: Right eye exhibits no discharge. Left eye exhibits no discharge. No scleral icterus.  Neck: Normal range of motion.  Cardiovascular: Normal rate and regular rhythm.   Murmur heard.  Systolic murmur is present with a grade of 2/6  Pulmonary/Chest: Breath sounds normal. No accessory muscle usage. Tachypnea noted. He is in respiratory distress. He exhibits tenderness. He exhibits no bony tenderness and no deformity.  Mild tenderness noted bilaterally of patient's axillae  Abdominal: Soft. Normal appearance and bowel sounds are normal. There is no tenderness.  Musculoskeletal: Normal range of motion. He exhibits no edema and no tenderness.  Neurological: He is alert and oriented to person, place, and time. He has normal strength. No cranial nerve deficit. Coordination and gait  normal.  Skin: Skin is warm and dry. No rash noted. He is not diaphoretic.  Psychiatric: He has a normal mood and affect.    ED Course  Procedures (including critical care time) Labs Review Labs Reviewed  CBC WITH DIFFERENTIAL - Abnormal; Notable for the following:    WBC 24.1 (*)    RBC 1.52 (*)    Hemoglobin 4.9 (*)    HCT 13.9 (*)    RDW 21.3 (*)    Platelets 561 (*)    Neutro Abs 15.9 (*)    Lymphs Abs 4.7 (*)    Monocytes Absolute 2.8 (*)    Basophils Absolute 0.2 (*)    All other components within normal limits  BASIC METABOLIC PANEL - Abnormal; Notable for the following:    Glucose, Bld 115 (*)    All other components within normal limits  URINALYSIS, ROUTINE  W REFLEX MICROSCOPIC - Abnormal; Notable for the following:    Color, Urine AMBER (*)    All other components within normal limits  RETICULOCYTES - Abnormal; Notable for the following:    Retic Ct Pct 16.0 (*)    RBC. 1.52 (*)    Retic Count, Manual 243.2 (*)    All other components within normal limits  PROTIME-INR - Abnormal; Notable for the following:    Prothrombin Time 16.6 (*)    All other components within normal limits  IRON AND TIBC - Abnormal; Notable for the following:    Iron 157 (*)    UIBC <15 (*)    All other components within normal limits  FERRITIN - Abnormal; Notable for the following:    Ferritin 2451 (*)    All other components within normal limits  VITAMIN B12  FOLATE  CBC  COMPREHENSIVE METABOLIC PANEL  PREPARE RBC (CROSSMATCH)  TYPE AND SCREEN    Imaging Review Dg Chest 2 View  09/22/2013   CLINICAL DATA:  Chest pain.  EXAM: CHEST  2 VIEW  COMPARISON:  August 28, 2013.  FINDINGS: Stable cardiomediastinal silhouette. Left subclavian Port-A-Cath is noted with distal tip in expected position of the SVC. No pneumothorax is noted. Stable interstitial densities are noted throughout both lungs most consistent with scarring. No acute pulmonary disease is noted. Bony thorax is intact.   IMPRESSION: No acute cardiopulmonary abnormality seen.   Electronically Signed   By: Sabino Dick M.D.   On: 09/22/2013 07:24     EKG Interpretation None      MDM   Final diagnoses:  Anemia, unspecified anemia type  Hypoxia  Sickle cell crisis    33 year old male with past medical history of sickle cell anemia with frequent admissions to the ER for sickle cell crisis presenting today with rib pain bilaterally in axillae.  Patient denying chest pain, nausea, vomiting, diarrhea, blood in stools, dysuria. Workup to include CBC, BMP, retic count, PT/INR, chest x-ray to determine sickle cell chest crisis versus possible PE or pneumonia. Patient's former records state patient usually uses a PCA of Dilaudid with a loading dose of 1.5 mg. Will give him 2 mg loading dose here in the ER with a when necessary 2 mg every 30 minutes for pain control.  Chest x-ray returns as unremarkable. Patient does have leukocytosis of 24.1, hemoglobin of 4.9, reticulocyte count of 16. Patient's last hemoglobin levels have been in the high 6 to low 7 range. Hgb today it is indicative of an acute anemia which is most likely contributing to patient's shortness of breath. We ordered patient 2 units of packed red blood cells and placed consult to medicine for admission with [facility]. Further workup with anemia panel placed.   We spoke with Dr. Jacinta Shoe, MD who agreed to admit patient to telemetry floor.  Consult and H&P note for admission to follow from [facility].     Signed,  Dahlia Bailiff, PA-C 5:34 PM   This patient seen and discussed with Dr. Jola Schmidt, M.D.      Gerald Mew, PA-C 09/22/13 [place] Gerald [place], PA-C 09/22/13 [phone]

## 2013-09-22 NOTE — ED Notes (Signed)
Pt presents with bilat rib pain x 2 days, pt reports pain as SS crisis. Pt states he has taken his MS Contin and Dilaudid without relief.

## 2013-09-22 NOTE — Progress Notes (Signed)
Hydroxyurea (Hydrea) hold criteria:  ANC < 2  Pltc < 80K in sickle-cell patients; < 100K in other patients  Hgb <= 6 in sickle-cell patients; < 8 in other patients  Reticulocytes < 80K when Hgb < 9  Hydrea held b/c pt has Hgb=4.9. F/u labs for resume.  Thanks, Dorrene German 09/22/2013 12:55 PM

## 2013-09-22 NOTE — ED Provider Notes (Signed)
Medical screening examination/treatment/procedure(s) were conducted as a shared visit with non-physician practitioner(s) and myself.  I personally evaluated the patient during the encounter.  CRITICAL CARE Performed by: Hoy Morn Total critical care time: 31 Critical care time was exclusive of separately billable procedures and treating other patients. Critical care was necessary to treat or prevent imminent or life-threatening deterioration. Critical care was time spent personally by me on the following activities: development of treatment plan with patient and/or surrogate as well as nursing, discussions with consultants, evaluation of patient's response to treatment, examination of patient, obtaining history from patient or surrogate, ordering and performing treatments and interventions, ordering and review of laboratory studies, ordering and review of radiographic studies, pulse oximetry and re-evaluation of patient's condition.  Admit to hospital for symptomatic anemia and hypoxia. Blood transfusion now.   Hemoglobin  Date Value Ref Range Status  09/22/2013 4.9* 13.0 - 17.0 g/dL Final     REPEATED TO VERIFY     CRITICAL RESULT CALLED TO, READ BACK BY AND VERIFIED WITH:     J OXENDINE AT 3154 ON 08.23.2015 BY NBROOKS  09/17/2013 6.8* 13.0 - 17.0 g/dL Final     REPEATED TO VERIFY     CRITICAL RESULT CALLED TO, READ BACK BY AND VERIFIED WITH:     Elmer Sow [id number] @ 1109 BY J SCOTTON  09/03/2013 6.9* 13.0 - 17.0 g/dL Final     REPEATED TO VERIFY     CRITICAL RESULT CALLED TO, READ BACK BY AND VERIFIED WITH:     T. TATUM RN AT 1115 ON 08.04.15 BY SHUEA  08/29/2013 6.9* 13.0 - 17.0 g/dL Final     REPEATED TO VERIFY     CRITICAL RESULT CALLED TO, READ BACK BY AND VERIFIED WITH:     ACQUAAH,E RN AT 1950 07.30.15 BY Leveda Anna       Hoy Morn, MD 09/22/13 2118

## 2013-09-22 NOTE — ED Notes (Signed)
Pt given ham [place]

## 2013-09-23 DIAGNOSIS — I2789 Other specified pulmonary heart diseases: Secondary | ICD-10-CM | POA: Diagnosis not present

## 2013-09-23 DIAGNOSIS — Z86711 Personal history of pulmonary embolism: Secondary | ICD-10-CM | POA: Diagnosis not present

## 2013-09-23 DIAGNOSIS — D5700 Hb-SS disease with crisis, unspecified: Secondary | ICD-10-CM | POA: Diagnosis not present

## 2013-09-23 DIAGNOSIS — R0902 Hypoxemia: Secondary | ICD-10-CM | POA: Diagnosis not present

## 2013-09-23 DIAGNOSIS — R079 Chest pain, unspecified: Secondary | ICD-10-CM | POA: Diagnosis not present

## 2013-09-23 DIAGNOSIS — I10 Essential (primary) hypertension: Secondary | ICD-10-CM | POA: Diagnosis not present

## 2013-09-23 LAB — CBC
HCT: 20.4 % — ABNORMAL LOW (ref 39.0–52.0)
Hemoglobin: 7.3 g/dL — ABNORMAL LOW (ref 13.0–17.0)
MCH: 32.2 pg (ref 26.0–34.0)
MCHC: 35.8 g/dL (ref 30.0–36.0)
MCV: 89.9 fL (ref 78.0–100.0)
PLATELETS: 426 10*3/uL — AB (ref 150–400)
RBC: 2.27 MIL/uL — ABNORMAL LOW (ref 4.22–5.81)
RDW: 19.3 % — AB (ref 11.5–15.5)
WBC: 16.8 10*3/uL — ABNORMAL HIGH (ref 4.0–10.5)

## 2013-09-23 LAB — TYPE AND SCREEN
ABO/RH(D): O POS
Antibody Screen: NEGATIVE
UNIT DIVISION: 0
Unit division: 0

## 2013-09-23 LAB — COMPREHENSIVE METABOLIC PANEL
ALBUMIN: 4 g/dL (ref 3.5–5.2)
ALT: 21 U/L (ref 0–53)
AST: 38 U/L — AB (ref 0–37)
Alkaline Phosphatase: 87 U/L (ref 39–117)
Anion gap: 12 (ref 5–15)
BUN: 10 mg/dL (ref 6–23)
CO2: 21 mEq/L (ref 19–32)
CREATININE: 0.57 mg/dL (ref 0.50–1.35)
Calcium: 8.7 mg/dL (ref 8.4–10.5)
Chloride: 111 mEq/L (ref 96–112)
GFR calc Af Amer: >90 mL/min (ref >90)
Glucose, Bld: 96 mg/dL (ref 70–99)
Potassium: 3.9 mEq/L (ref 3.7–5.3)
Sodium: 144 mEq/L (ref 137–147)
TOTAL PROTEIN: 7 g/dL (ref 6.0–8.3)
Total Bilirubin: 6.1 mg/dL — ABNORMAL HIGH (ref 0.3–1.2)

## 2013-09-23 MED ORDER — DEXTROSE-NACL 5-0.45 % IV SOLN
INTRAVENOUS | Status: DC
  Administered 2013-09-23 – 2013-09-24 (×2): via INTRAVENOUS

## 2013-09-23 MED ORDER — DIPHENHYDRAMINE HCL 25 MG PO CAPS: 25.0000 mg | ORAL_CAPSULE | Freq: Four times a day (QID) | ORAL | Status: DC | PRN

## 2013-09-23 MED ORDER — DEXTROSE 5 % IV SOLN: 2000.0000 mg | INTRAVENOUS | Status: DC

## 2013-09-23 MED ORDER — KETOROLAC TROMETHAMINE 15 MG/ML IJ SOLN
15.0000 mg | Freq: Four times a day (QID) | INTRAMUSCULAR | Status: DC
Start: 2013-09-23 — End: 2013-09-24
  Administered 2013-09-23 – 2013-09-24 (×5): 15 mg via INTRAVENOUS
  Filled 2013-09-23 (×8): qty 1

## 2013-09-23 NOTE — Progress Notes (Addendum)
Patient ID: [name], male   DOB: 1980/02/23, 33 y.o.   MRN: [id number]  SICKLE CELL SERVICE PROGRESS NOTE  [name] TMA:[id number] DOB: 05/01/1980 DOA: 09/22/2013 PCP: [name],[name], MD   Presenting HPI: [name] is a 33 y.o. male with PMH of Sickle cell disease, Chronic anemia due to same, H/o PE on Xarelto, Pulm HTN presented to the ER with complaints of pain on the sides of his chest. He reports being in his usual state of health till last week. About 6-7days ago started having pain in his sides/ribs which he usually has in the setting of crises and leg pains too. He has been self medicating with PO dilaudid and MS COntin this week only to moderate Relief, since the pain worsened he presented to the ER. In addition, he also reports dyspnea on exertion for 1 week now.  In ER, noted to have Hb of 4.9 and acute pain crises. No h/o hematemesis or melena   Consultants:  None  Procedures:  None  Antibiotics:  none  HPI/Subjective: Pt states that his pain has improved and is now a 6/10. He still has pain on both sides of his chest. Pain feels more in the chest wall. He denies any dyspnea, SOB, or cough. CXR done yesterday showed no acute process. He is eating well and bowel/bladder habits are normal. He complains of itch with his pain medicine. He received 2 units of PRBCs yesterday for a hgb of 4.9.  Objective: Filed Vitals:   09/22/13 2154 09/23/13 0414 09/23/13 0431 09/23/13 0641  BP: 110/66  118/77   Pulse: 76  69   Temp: 97.8 F (36.6 C)  98.1 F (36.7 C)   TempSrc: Oral  Oral   Resp: 18 18 13 15   Height:      Weight:      SpO2: 95% 91% 97% 94%   Weight change: 1 lb 11.2 oz (0.771 kg)  Intake/Output Summary (Last 24 hours) at 09/23/13 1059 Last data filed at 09/23/13 0715  Gross per 24 hour  Intake  902.5 ml  Output   1375 ml  Net -472.5 ml    General: Alert, awake, oriented x3, in no acute distress.  HEENT: [place]/AT PEERL, EOMI Neck:  Trachea midline,  no masses, no JVD, no carotid bruit, no LAD OROPHARYNX:  Moist, No exudate/ erythema/lesions.  Heart: Regular rate and rhythm, without murmurs, rubs, gallops Lungs: Clear to auscultation, no wheezing or rhonchi noted.  Abdomen: Soft, nontender, nondistended, positive bowel sounds, no masses no hepatosplenomegaly noted. Neuro: No focal neurological deficits noted cranial nerves II through XII grossly intact. DTRs 2+ bilaterally upper and lower extremities. Strength 5 out of 5 in bilateral upper and lower extremities. Musculoskeletal: No warm swelling or erythema around joints, no spinal tenderness noted. Psychiatric: Patient alert and oriented x3, good insight and cognition, good recent to remote recall. Lymph node survey: No cervical axillary or inguinal lymphadenopathy noted.   Data Reviewed: Basic Metabolic Panel:  Recent Labs Lab 09/17/13 1000 09/22/13 0556 09/23/13 0450  NA 139 141 144  K 3.8 3.9 3.9  CL 105 106 111  CO2 21 23 21   GLUCOSE 95 115* 96  BUN 7 9 10   CREATININE 0.60 0.71 0.57  CALCIUM 9.1 8.8 8.7   Liver Function Tests:  Recent Labs Lab 09/17/13 1000 09/23/13 0450  AST 39* 38*  ALT 23 21  ALKPHOS 86 87  BILITOT 7.4* 6.1*  PROT 7.4 7.0  ALBUMIN 4.1 4.0  CBC:  Recent Labs Lab 09/17/13 1000 09/22/13 0556 09/23/13 0450  WBC 13.9* 24.1* 16.8*  NEUTROABS 10.7* 15.9*  --   HGB 6.8* 4.9* 7.3*  HCT 19.2* 13.9* 20.4*  MCV 91.9 91.4 89.9  PLT 595* 561* 426*   Recent Labs  11/13/12 1826  PROBNP 106.3   Studies: Dg Chest 2 View  09/22/2013   CLINICAL DATA:  Chest pain.  EXAM: CHEST  2 VIEW  COMPARISON:  August 28, 2013.  FINDINGS: Stable cardiomediastinal silhouette. Left subclavian Port-A-Cath is noted with distal tip in expected position of the SVC. No pneumothorax is noted. Stable interstitial densities are noted throughout both lungs most consistent with scarring. No acute pulmonary disease is noted. Bony thorax is intact.   IMPRESSION: No acute cardiopulmonary abnormality seen.   Electronically Signed   By: Sabino Dick M.D.   On: 09/22/2013 07:24   Dg Chest 2 View  08/28/2013   CLINICAL DATA:  Shortness of breath.  History of sickle cell anemia.  EXAM: CHEST  2 VIEW  COMPARISON:  CT 08/26/2013.  Radiographs 08/25/2013.  FINDINGS: The left subclavian Port-A-Cath tip is unchanged at the SVC right atrial junction. There is stable cardiomegaly. Generalized accentuation of the interstitial markings and patchy bibasilar opacities are unchanged. There is no progressive consolidation or significant pleural effusion. There are stable endplate changes within the thoracic spine consistent with sickle cell anemia.  IMPRESSION: Radiographically stable chest with cardiomegaly, accentuated interstitial markings and patchy bibasilar pulmonary opacities. No new findings demonstrated.   Electronically Signed   By: Camie Patience M.D.   On: 08/28/2013 14:12   Dg Chest 2 View  08/25/2013   CLINICAL DATA:  Sickle cell crisis.  Shortness of breath.  EXAM: CHEST  2 VIEW  COMPARISON:  08/13/2013 and 07/29/2013  FINDINGS: Power port in place. Chronic cardiomegaly. Pulmonary vascularity is within normal limits. There is slight chronic accentuation of the interstitial markings. No effusions. No acute osseous abnormality.  IMPRESSION: No acute findings. Chronic cardiomegaly. Chronic slight accentuation of the interstitial markings.   Electronically Signed   By: Rozetta Nunnery M.D.   On: 08/25/2013 23:55   Ct Angio Chest Pe W/cm &/or Wo Cm  08/26/2013   CLINICAL DATA:  Bilateral rib pain, worse on the left. History of sickle cell. White cell count 16.6.  EXAM: CT ANGIOGRAPHY CHEST WITH CONTRAST  TECHNIQUE: Multidetector CT imaging of the chest was performed using the standard protocol during bolus administration of intravenous contrast. Multiplanar CT image reconstructions and MIPs were obtained to evaluate the vascular anatomy.  CONTRAST:  162mL OMNIPAQUE  IOHEXOL 350 MG/ML SOLN  COMPARISON:  06/17/2013  FINDINGS: Left central venous catheter remains unchanged in position. Technically adequate study with moderately good opacification of central and proximal segmental pulmonary arteries. Distal segmental vessels are not well demonstrated due to motion artifact. There is no focal filling defect in the visualized pulmonary arteries. No significant pulmonary embolus is suggested.  Cardiac enlargement. Normal caliber thoracic aorta. Moderately prominent lymph nodes in the anterior mediastinum without change since prior study. Esophagus is decompressed. Evaluation of lungs is limited due to respiratory motion artifact but there appears to be patchy infiltration in the lung periphery bilaterally, more prominent in the bases. Scattered nodular changes are demonstrated similar previous study. Infiltrative changes are some of the prior study and may represent infiltration due to pneumonia, atelectasis, or acute chest syndrome. No pleural effusion or pneumothorax. Diffusely increased density and atrophy of the spleen consistent with sickle cell related  changes. Thoracic vertebral changes consistent with sickle cell.  Review of the MIP images confirms the above findings.  IMPRESSION: No evidence of significant pulmonary embolus. Evaluation of lungs is limited due to respiratory motion artifact but there appears to be patchy peripheral infiltrative changes bilaterally, similar to prior study. Cardiac enlargement and prominent anterior mediastinal lymph nodes are also unchanged. Changes consistent with sickle cell effect in the spleen and spine.   Electronically Signed   By: Lucienne Capers M.D.   On: 08/26/2013 02:34    Scheduled Meds: . celecoxib  200 mg Oral BID  . deferoxamine (DESFERAL) IV  2,000 mg Intravenous Q24H  . folic acid  1 mg Oral q morning - 10a  . HYDROmorphone PCA 0.3 mg/mL   Intravenous 6 times per day  . lisinopril  10 mg Oral Daily  . metoprolol  tartrate  25 mg Oral Daily  . morphine  30 mg Oral Q12H  . rivaroxaban  20 mg Oral Q supper  . senna  1 tablet Oral BID  . sodium chloride  10-40 mL Intracatheter Q12H   Continuous Infusions: . sodium chloride 1,000 mL (09/22/13 2328)    Active Problems:   Hx of pulmonary embolus   Leukocytosis, unspecified   Acute sickle cell crisis   Anemia   Assessment/Plan: Active Problems:   Hx of pulmonary embolus   Leukocytosis, unspecified   Acute sickle cell crisis   Anemia  1. Sickle Cell Anemia with crisis: Continue Dilaudid PCA 0.8mg  q10 min. Used 15.78mg  in 24 hours. Discontinue Celebrex and start Toradol 15mg  q6h.  Continue MS contin for long acting and PO Benadryl for itch. 2. Anemia: Hgb of 4.9 on 8/23, likely due to hemolysis, however no LDH obtained. Transfused 2 units PRBC, now Hgb is 7.3. Continue to monitor. Has a Ferritin of 2451, which can be an acute phase reactant. No CRP obtained at the same time. He has a h/o elevated Ferritin and refusal to take ExJade. Pt should have his ferritin followed up by his PCP after discharged. 3. Sickle Cell Care: Continue Folic Acid. Hydroxyurea held due to Hgb of 4.9. Should be restarted if Hgb continues to be stable.  4. H/o PE: Continue Rivaroxaban 5. HTN: Well controlled. Continue home Lisinopril and Metoprolol    6. FEN/GI :0.9% NS stopped and D5/0.45% NS started at half maintenance rate of 27ml/hr. Regular Diet  Bowel regimen in place  Code Status: Full code DVT Prophylaxis: on Xarelto Family Communication: none Disposition Plan: home once pt is able to manage pain at home  Kalman Shan  Pager [phone]. If 7PM-7AM, please contact night-coverage.  09/23/2013, 10:59 AM  LOS: 1 day   Kalman Shan

## 2013-09-23 NOTE — Progress Notes (Signed)
Assumed care of patient at 1500. Patient resting in bed, no needs at this time. Agree with previous RN's assessment.

## 2013-09-24 DIAGNOSIS — I10 Essential (primary) hypertension: Secondary | ICD-10-CM | POA: Diagnosis not present

## 2013-09-24 DIAGNOSIS — I2789 Other specified pulmonary heart diseases: Secondary | ICD-10-CM | POA: Diagnosis not present

## 2013-09-24 DIAGNOSIS — R0902 Hypoxemia: Secondary | ICD-10-CM | POA: Diagnosis not present

## 2013-09-24 DIAGNOSIS — D5700 Hb-SS disease with crisis, unspecified: Secondary | ICD-10-CM | POA: Diagnosis not present

## 2013-09-24 DIAGNOSIS — R079 Chest pain, unspecified: Secondary | ICD-10-CM | POA: Diagnosis not present

## 2013-09-24 DIAGNOSIS — Z86711 Personal history of pulmonary embolism: Secondary | ICD-10-CM | POA: Diagnosis not present

## 2013-09-24 LAB — CBC WITH DIFFERENTIAL/PLATELET
Basophils Absolute: 0.1 10*3/uL (ref 0.0–0.1)
Basophils Relative: 1 % (ref 0–1)
Eosinophils Absolute: 0.9 10*3/uL — ABNORMAL HIGH (ref 0.0–0.7)
Eosinophils Relative: 6 % — ABNORMAL HIGH (ref 0–5)
HEMATOCRIT: 20.8 % — AB (ref 39.0–52.0)
Hemoglobin: 7.2 g/dL — ABNORMAL LOW (ref 13.0–17.0)
Lymphocytes Relative: 19 % (ref 12–46)
Lymphs Abs: 2.8 10*3/uL (ref 0.7–4.0)
MCH: 31.9 pg (ref 26.0–34.0)
MCHC: 34.6 g/dL (ref 30.0–36.0)
MCV: 92 fL (ref 78.0–100.0)
Monocytes Absolute: 1.9 10*3/uL — ABNORMAL HIGH (ref 0.1–1.0)
Monocytes Relative: 13 % — ABNORMAL HIGH (ref 3–12)
Neutro Abs: 9.2 10*3/uL — ABNORMAL HIGH (ref 1.7–7.7)
Neutrophils Relative %: 61 % (ref 43–77)
PLATELETS: 390 10*3/uL (ref 150–400)
RBC: 2.26 MIL/uL — ABNORMAL LOW (ref 4.22–5.81)
RDW: 19.6 % — ABNORMAL HIGH (ref 11.5–15.5)
WBC: 14.8 10*3/uL — AB (ref 4.0–10.5)

## 2013-09-24 MED ORDER — HEPARIN SOD (PORK) LOCK FLUSH 100 UNIT/ML IV SOLN
500.0000 [IU] | INTRAVENOUS | Status: AC | PRN
  Administered 2013-09-24: 500 [IU]

## 2013-09-24 MED ORDER — ZOLPIDEM TARTRATE 5 MG PO TABS
5.0000 mg | ORAL_TABLET | Freq: Every evening | ORAL | Status: DC | PRN
  Administered 2013-09-24: 5 mg via ORAL
  Filled 2013-09-24: qty 1

## 2013-09-25 ENCOUNTER — Telehealth: Payer: Self-pay | Admitting: Internal Medicine

## 2013-09-25 DIAGNOSIS — G4700 Insomnia, unspecified: Secondary | ICD-10-CM

## 2013-09-25 DIAGNOSIS — D5700 Hb-SS disease with crisis, unspecified: Secondary | ICD-10-CM

## 2013-09-25 MED ORDER — POTASSIUM CHLORIDE CRYS ER 20 MEQ PO TBCR: 20.0000 meq | EXTENDED_RELEASE_TABLET | Freq: Every morning | ORAL | Status: DC

## 2013-09-25 NOTE — Telephone Encounter (Signed)
Potassium 20MEQ sent in via e-script.   Request for Dilaudid 4mg , MS Contin 30mg , and Ambien 10mg . LOV 08/22/2013. Please advise. Thanks!

## 2013-09-26 MED ORDER — ZOLPIDEM TARTRATE 10 MG PO TABS: 10.0000 mg | ORAL_TABLET | Freq: Every evening | ORAL | Status: DC | PRN

## 2013-09-26 MED ORDER — HYDROMORPHONE HCL 4 MG PO TABS: 4.0000 mg | ORAL_TABLET | ORAL | Status: DC | PRN

## 2013-09-26 MED ORDER — MORPHINE SULFATE ER 30 MG PO TBCR: 30.0000 mg | EXTENDED_RELEASE_TABLET | Freq: Two times a day (BID) | ORAL | Status: DC

## 2013-09-26 NOTE — Telephone Encounter (Signed)
Prescription written for MS Contin 30 mg #60 (fill date 09/03/2013), Dilaudid 4 mg #90 and Ambien 10 mg #30.

## 2013-09-27 ENCOUNTER — Telehealth (HOSPITAL_COMMUNITY): Payer: Self-pay | Admitting: *Deleted

## 2013-09-27 NOTE — Telephone Encounter (Signed)
Received patient call, patient verifying if his prescriptions are ready. Prescriptions verified and ready for pickup. Patient aware and states to pick up today. Informed patient of [facility] Cell An[facility] on Friday, Sept. 18, 2015. Patient acknowledges.

## 2013-09-29 NOTE — Discharge Summary (Signed)
Patient admitted to the day hospital. Treated with IV Dilaudid, Toradol and IVF. Did much better at DC. Agree with notes above by NP.

## 2013-09-29 NOTE — H&P (Signed)
Patient admitted to the day hospital with sickle cell Painful crisis. Seen and examined. Agree with notes above by NP.

## 2013-09-30 IMAGING — CR DG CHEST 2V
2 series · 2 of 2 positions shown · non-contrast
Comparison: None

CLINICAL DATA: 30-year-old male with chest and rib pain.

CHEST - 2 VIEW

[w chest pa]
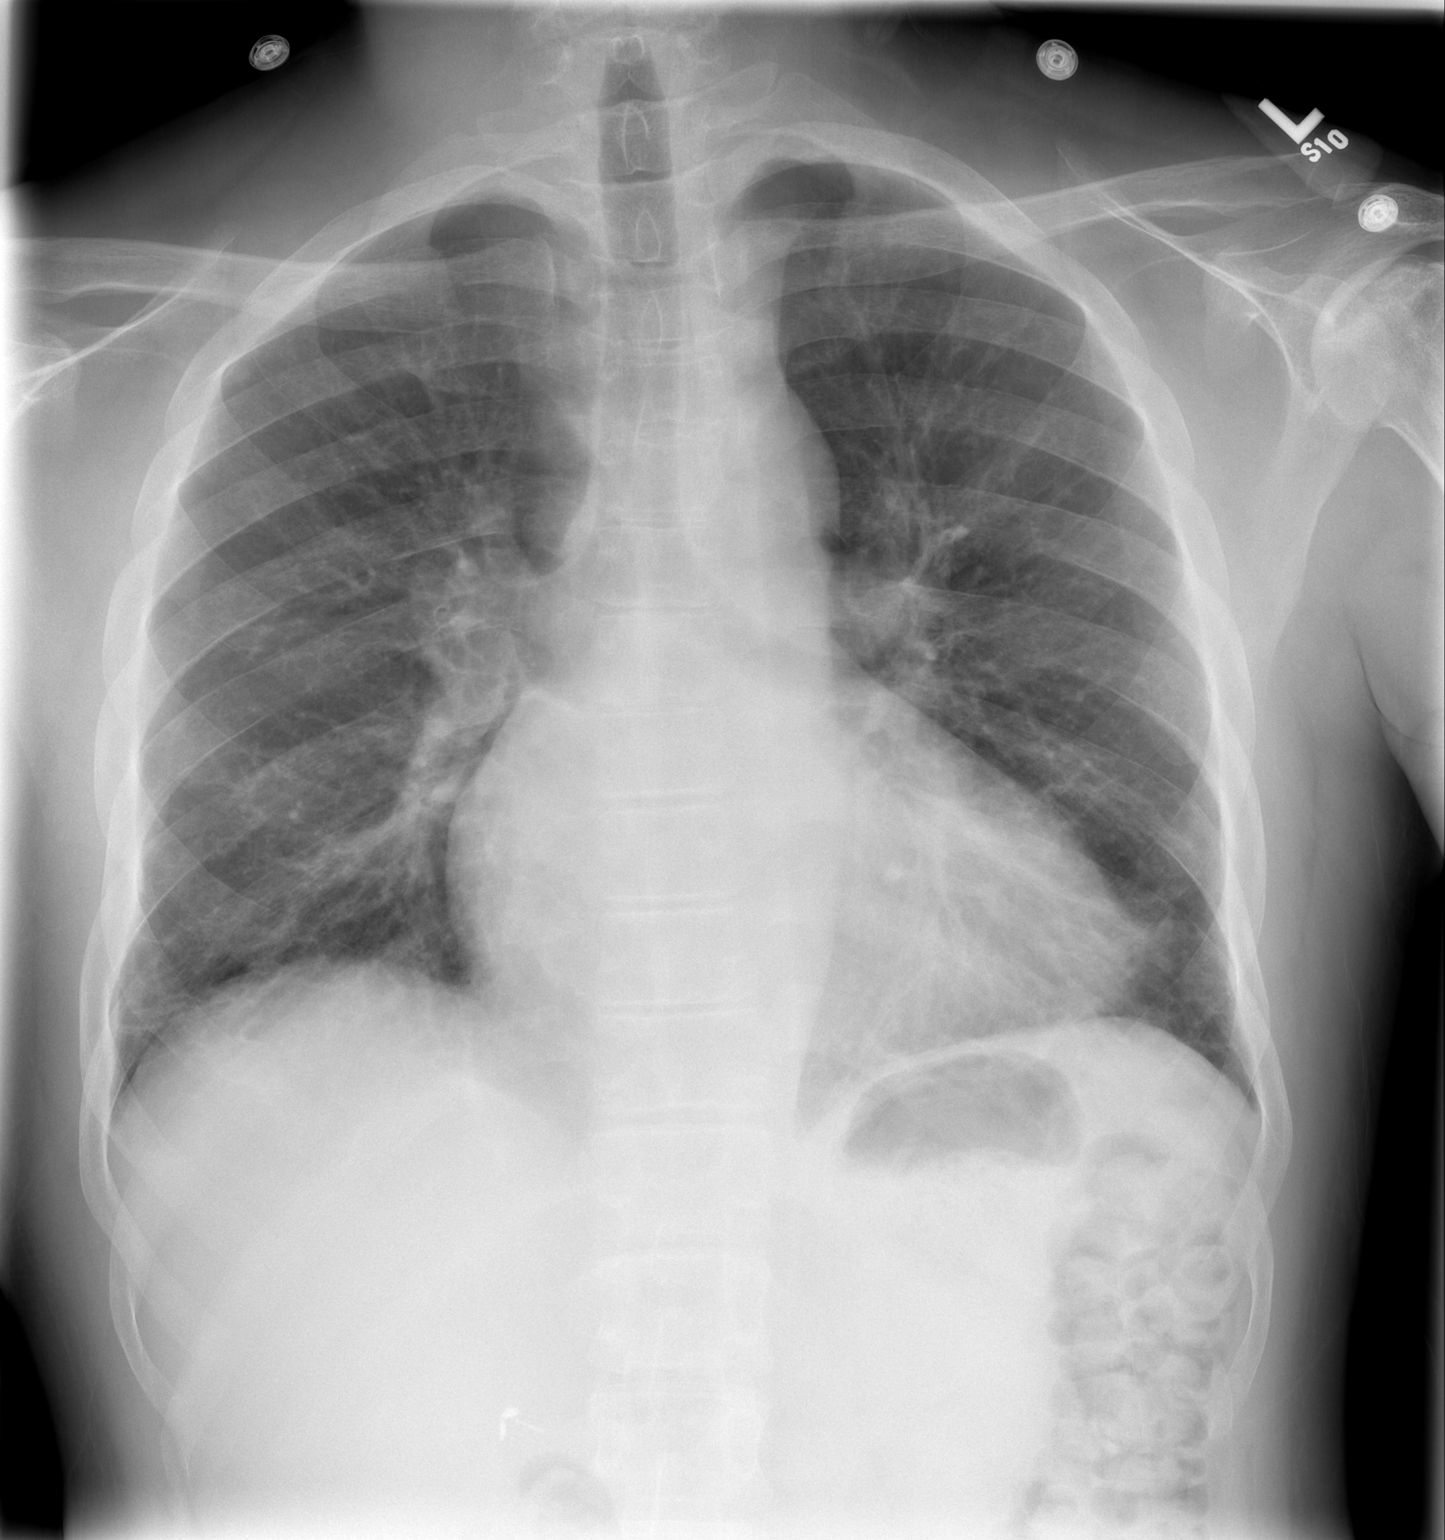

[w chest lat]
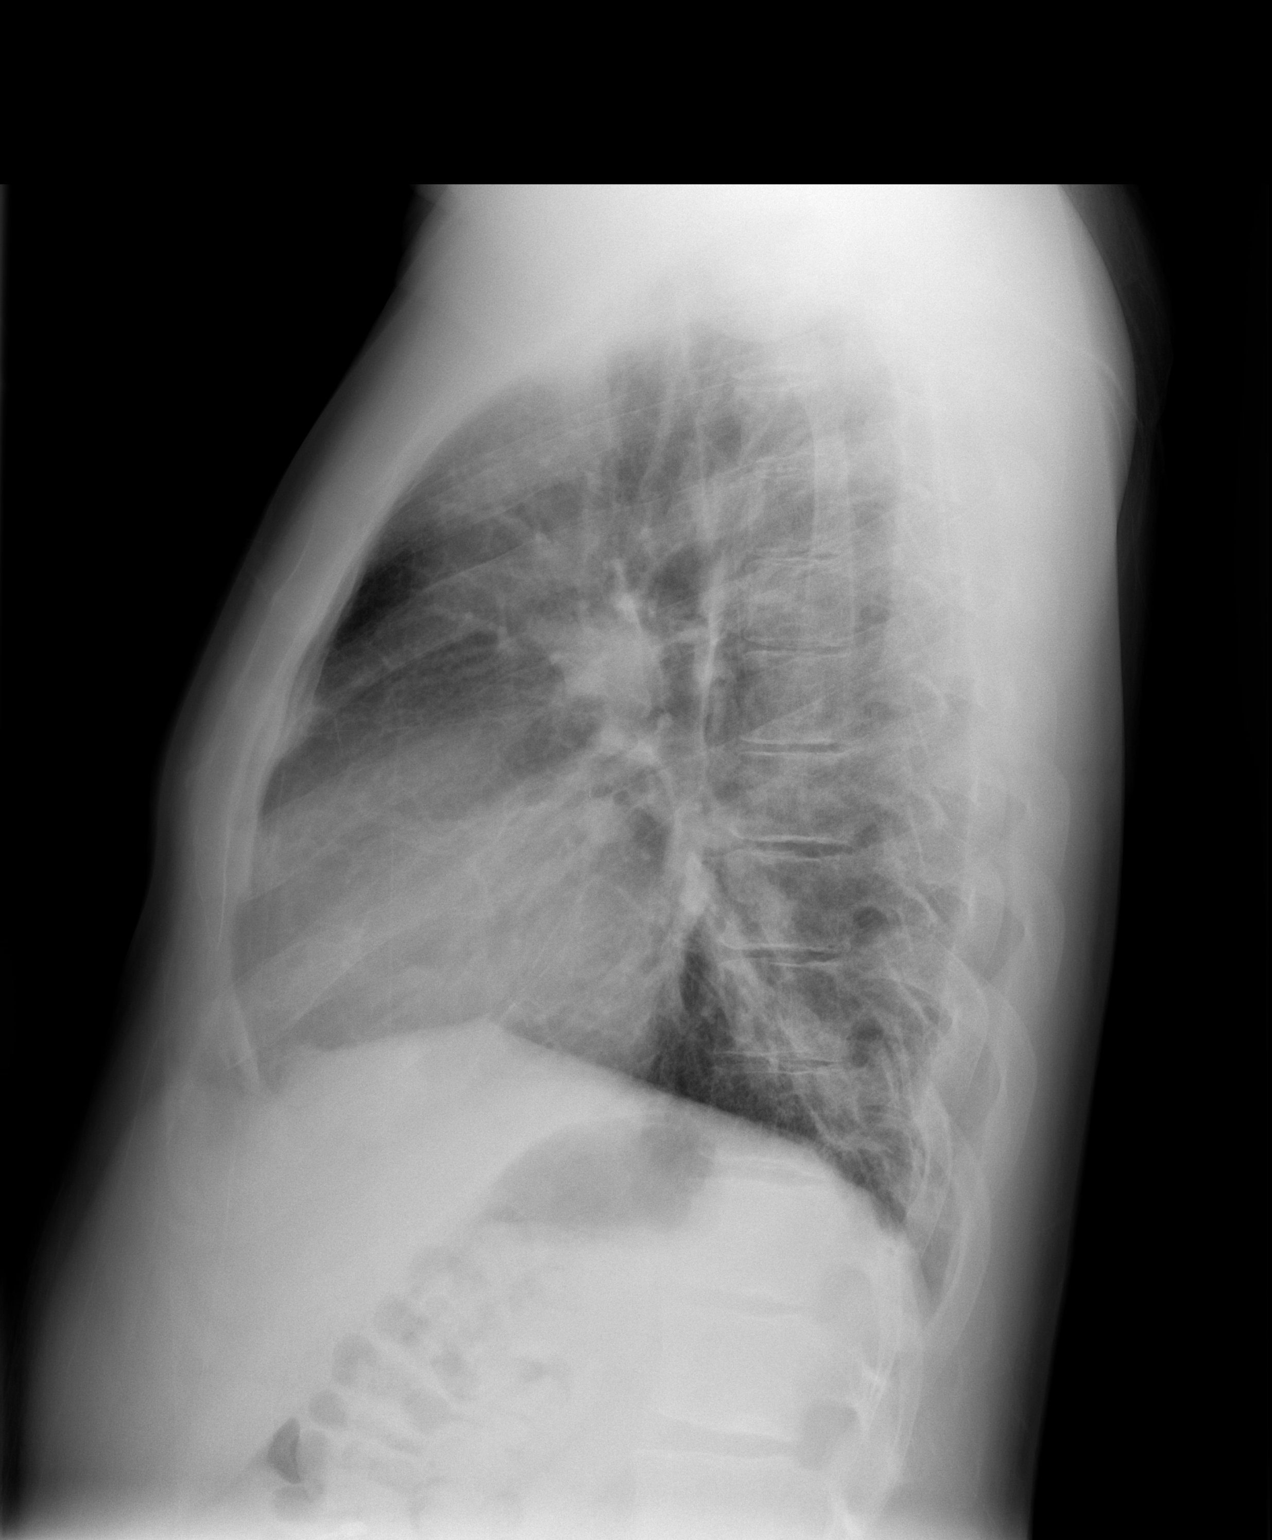

[2 of 2 positions shown; findings below may reference images not displayed]

FINDINGS: Upper limits normal heart size identified.
Mild peribronchial thickening is present.
There is no evidence of focal airspace disease, pulmonary edema,
pulmonary nodule/mass, pleural effusion, or pneumothorax.
AVN of the left humeral head is noted.
IMPRESSION: Mild peribronchial thickening of uncertain chronicity.

Left humeral head AVN.

No evidence of rib abnormality.

Upper limits normal heart size.

## 2013-10-02 NOTE — Discharge Summary (Signed)
[name] MRN: [id number] DOB/AGE: 34-02-1980 34 y.o.  Admit date: 09/22/2013 Discharge date: 09/24/2013  Primary Care Physician:  Kasidi Shanker A., MD   Discharge Diagnoses:   Patient Active Problem List   Diagnosis Date Noted  . NSTEMI (non-ST elevated myocardial infarction) 06/18/2013    Priority: High  . Acute chest syndrome 06/18/2013    Priority: High  . Sickle cell crisis 06/14/2013    Priority: Medium  . Avascular necrosis     Priority: Medium  . Anemia 09/22/2013  . Acute sickle cell crisis 08/26/2013  . Chronic anticoagulation 08/22/2013  . HTN (hypertension) 08/22/2013  . Sickle cell anemia with pain 07/10/2013  . Thrombocytosis 07/10/2013  . Thrombocytosis 07/01/2013  . Pulmonary HTN 06/18/2013  . Bilateral flank pain 06/17/2013  . Sickle cell anemia 06/17/2013  . Hypoxia 06/17/2013  . Hypoxemia 06/17/2013  . Acute respiratory failure with hypoxia 06/02/2013  . HCAP (healthcare-associated pneumonia) 06/02/2013  . Diastolic congestive heart failure, NYHA class 1 05/19/2013  . Sickle-cell crisis with associated acute chest syndrome 05/13/2013  . Functional asplenia   . Alcohol consumption of one to four drinks per day   . Hypokalemia 04/22/2013  . Unspecified vitamin D deficiency 02/13/2013  . Uses marijuana 01/13/2013  . Leukocytosis, unspecified 08/30/2012  . 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 Hx of pulmonary embolus 06/29/2012    DISCHARGE MEDICATION:   Medication List         celecoxib 200 MG capsule  Commonly known as:  CELEBREX  Take 1 capsule (200 mg total) by mouth 2 (two) times daily.     folic acid 1 MG tablet  Commonly known as:  FOLVITE  Take 1 tablet (1 mg total) by mouth every morning.     hydroxyurea 500 MG capsule  Commonly known as:  HYDREA  Take 2 capsules (1,000 mg total) by mouth daily. May take with food to minimize GI side effects.     lisinopril 10 MG tablet  Commonly known as:  PRINIVIL,ZESTRIL  Take 1  tablet (10 mg total) by mouth daily.     metoprolol tartrate 25 MG tablet  Commonly known as:  LOPRESSOR  Take 1 tablet (25 mg total) by mouth daily.     rivaroxaban 20 MG Tabs tablet  Commonly known as:  XARELTO  Take 1 tablet (20 mg total) by mouth every morning.         SIGNIFICANT DIAGNOSTIC STUDIES:  Dg Chest 2 View  09/22/2013   CLINICAL DATA:  Chest pain.  EXAM: CHEST  2 VIEW  COMPARISON:  August 28, 2013.  FINDINGS: Stable cardiomediastinal silhouette. Left subclavian Port-A-Cath is noted with distal tip in expected position of the SVC. No pneumothorax is noted. Stable interstitial densities are noted throughout both lungs most consistent with scarring. No acute pulmonary disease is noted. Bony thorax is intact.  IMPRESSION: No acute cardiopulmonary abnormality seen.   Electronically Signed   By: Sabino Dick M.D.   On: 09/22/2013 07:24      No results found for this or any previous visit (from the past 240 hour(s)).  BRIEF ADMITTING H & P: [name] is a 33 y.o. male with PMH of Sickle cell disease, Chronic anemia due to same, H/o PE on Xarelto, Pulm HTN presents to the ER with the above complaints.  He reports being in his usual state of health till last weel.  About 6-7days ago started having pain in his sides/ribs which he usually has in the setting  of crises and leg pains too.  He has been self medicating with PO dilaudid and MS COntin this week only to moderate Relief, since the pain worsened he presented to the ER today.  In addition also reports dyspnea on exertion for 1 week now.  In ER, noted to have Hb of 4.9 and acute pain crises  No h/o hematemesis or melena    Hospital Course:  Present on Admission:  . Acute sickle cell crisis: Pt was  treated with a weight based Dilaudid PCA, Toradol and hypotonic IVF. His pain improved and he was transitioned to oral analgesics at the Pre-hospital dosing. At the time of discharge his pain was controlled and patient  was able to function without difficulty.   . Anemia: Pt had a Hb of 6.9 on admission which decreased to a nadir of 4.9. He did have elevated bilirubin which would suggest hemolysis, however an LDH was not available to assess hemolysis. He was transfused 2 units of PRBC's and his Hb was at baseline of 7.2 at time of discharge. He is to follow up in the office for further evaluation of anemia.  Marland Kitchen Hx of pulmonary embolus: Pt was continued on Xarelto. There was no evidence of bleeding  . Leukocytosis, unspecified: No evidence of infection.Pthas a chronic leukocytosis  . 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   Marland Kitchen Hypoxemia: Pt's saturations are no normal on RA while ambulating. Likely associated with anemia and chest wall pain causing shallow breathing  Disposition and Follow-up:  Pt discharged in good condition and will follow up with Cammie Sickle NP in clinic on 10/08/2013     Discharge Instructions   Activity as tolerated - No restrictions    Complete by:  As directed      Diet - low sodium heart healthy    Complete by:  As directed      Discharge patient    Complete by:  As directed            DISCHARGE EXAM:  General: Alert, awake, oriented x3, in no acute distress.  Vital Signs: BP 111/72, HR 63, T 98.1 F (36.7 C), temperature source Oral, RR 15, height 6' (1.829 m), weight 164 lb 11.2 oz (74.707 kg), SpO2 96.00%. HEENT: [place]/AT PEERL, EOMI, mild icterus at baseline.  Neck: Trachea midline, no masses, no thyromegal,y no JVD, no carotid bruit  OROPHARYNX: Moist, No exudate/ erythema/lesions.  Heart: Regular rate and rhythm, without murmurs, rubs, gallops, PMI non-displaced, no heaves or thrills on palpation.  Lungs: Clear to auscultation, no wheezing or rhonchi noted. No increased vocal fremitus resonant to percussion  Abdomen: Soft, nontender, nondistended, positive bowel sounds, no masses no  hepatosplenomegaly noted..  Neuro: No focal neurological deficits noted cranial nerves II through XII grossly intact.    Time spent in decision making and face to face was greater than 30 minutes.  Signed: Leyton Brownlee A. 10/02/2013, 9:14 PM

## 2013-10-05 ENCOUNTER — Emergency Department (HOSPITAL_COMMUNITY)
Admission: EM | Admit: 2013-10-05 | Discharge: 2013-10-05 | Disposition: A | Discharge status: Home / Self Care | Payer: Medicare Other | Source: Home / Self Care | Attending: Emergency Medicine | Admitting: Emergency Medicine

## 2013-10-05 ENCOUNTER — Emergency Department (HOSPITAL_COMMUNITY): Payer: Medicare Other

## 2013-10-05 ENCOUNTER — Encounter (HOSPITAL_COMMUNITY): Payer: Self-pay | Admitting: Emergency Medicine

## 2013-10-05 DIAGNOSIS — Z79899 Other long term (current) drug therapy: Secondary | ICD-10-CM | POA: Insufficient documentation

## 2013-10-05 DIAGNOSIS — Z86711 Personal history of pulmonary embolism: Secondary | ICD-10-CM

## 2013-10-05 DIAGNOSIS — I252 Old myocardial infarction: Secondary | ICD-10-CM

## 2013-10-05 DIAGNOSIS — I5032 Chronic diastolic (congestive) heart failure: Secondary | ICD-10-CM | POA: Diagnosis present

## 2013-10-05 DIAGNOSIS — E876 Hypokalemia: Secondary | ICD-10-CM | POA: Insufficient documentation

## 2013-10-05 DIAGNOSIS — J984 Other disorders of lung: Secondary | ICD-10-CM | POA: Diagnosis not present

## 2013-10-05 DIAGNOSIS — I2789 Other specified pulmonary heart diseases: Secondary | ICD-10-CM | POA: Diagnosis present

## 2013-10-05 DIAGNOSIS — I10 Essential (primary) hypertension: Secondary | ICD-10-CM

## 2013-10-05 DIAGNOSIS — R079 Chest pain, unspecified: Secondary | ICD-10-CM | POA: Diagnosis not present

## 2013-10-05 DIAGNOSIS — Z8739 Personal history of other diseases of the musculoskeletal system and connective tissue: Secondary | ICD-10-CM | POA: Insufficient documentation

## 2013-10-05 DIAGNOSIS — D5700 Hb-SS disease with crisis, unspecified: Secondary | ICD-10-CM | POA: Diagnosis not present

## 2013-10-05 DIAGNOSIS — D72829 Elevated white blood cell count, unspecified: Secondary | ICD-10-CM | POA: Diagnosis present

## 2013-10-05 DIAGNOSIS — M8700 Idiopathic aseptic necrosis of unspecified bone: Secondary | ICD-10-CM | POA: Diagnosis present

## 2013-10-05 DIAGNOSIS — I517 Cardiomegaly: Secondary | ICD-10-CM | POA: Diagnosis not present

## 2013-10-05 DIAGNOSIS — Z8659 Personal history of other mental and behavioral disorders: Secondary | ICD-10-CM | POA: Insufficient documentation

## 2013-10-05 DIAGNOSIS — Z96649 Presence of unspecified artificial hip joint: Secondary | ICD-10-CM

## 2013-10-05 DIAGNOSIS — Z87891 Personal history of nicotine dependence: Secondary | ICD-10-CM

## 2013-10-05 DIAGNOSIS — Z86718 Personal history of other venous thrombosis and embolism: Secondary | ICD-10-CM

## 2013-10-05 DIAGNOSIS — I509 Heart failure, unspecified: Secondary | ICD-10-CM | POA: Diagnosis present

## 2013-10-05 DIAGNOSIS — Z8619 Personal history of other infectious and parasitic diseases: Secondary | ICD-10-CM | POA: Insufficient documentation

## 2013-10-05 DIAGNOSIS — Z7901 Long term (current) use of anticoagulants: Secondary | ICD-10-CM | POA: Insufficient documentation

## 2013-10-05 DIAGNOSIS — D5701 Hb-SS disease with acute chest syndrome: Secondary | ICD-10-CM | POA: Diagnosis present

## 2013-10-05 DIAGNOSIS — D571 Sickle-cell disease without crisis: Secondary | ICD-10-CM | POA: Diagnosis not present

## 2013-10-05 LAB — COMPREHENSIVE METABOLIC PANEL
ALT: 19 U/L (ref 0–53)
AST: 39 U/L — ABNORMAL HIGH (ref 0–37)
Albumin: 4 g/dL (ref 3.5–5.2)
Alkaline Phosphatase: 84 U/L (ref 39–117)
Anion gap: 14 (ref 5–15)
BUN: 8 mg/dL (ref 6–23)
CALCIUM: 9 mg/dL (ref 8.4–10.5)
CO2: 20 meq/L (ref 19–32)
CREATININE: 0.63 mg/dL (ref 0.50–1.35)
Chloride: 102 mEq/L (ref 96–112)
GLUCOSE: 96 mg/dL (ref 70–99)
Potassium: 3.9 mEq/L (ref 3.7–5.3)
Sodium: 136 mEq/L — ABNORMAL LOW (ref 137–147)
Total Bilirubin: 6.7 mg/dL — ABNORMAL HIGH (ref 0.3–1.2)
Total Protein: 7.4 g/dL (ref 6.0–8.3)

## 2013-10-05 LAB — CBC WITH DIFFERENTIAL/PLATELET
Basophils Absolute: 0.2 10*3/uL — ABNORMAL HIGH (ref 0.0–0.1)
Basophils Relative: 1 % (ref 0–1)
EOS PCT: 2 % (ref 0–5)
Eosinophils Absolute: 0.3 10*3/uL (ref 0.0–0.7)
HEMATOCRIT: 20.2 % — AB (ref 39.0–52.0)
Hemoglobin: 6.9 g/dL — CL (ref 13.0–17.0)
LYMPHS ABS: 2.4 10*3/uL (ref 0.7–4.0)
LYMPHS PCT: 13 % (ref 12–46)
MCH: 32.7 pg (ref 26.0–34.0)
MCHC: 34.2 g/dL (ref 30.0–36.0)
MCV: 95.7 fL (ref 78.0–100.0)
MONO ABS: 2.4 10*3/uL — AB (ref 0.1–1.0)
Monocytes Relative: 13 % — ABNORMAL HIGH (ref 3–12)
Neutro Abs: 13.6 10*3/uL — ABNORMAL HIGH (ref 1.7–7.7)
Neutrophils Relative %: 72 % (ref 43–77)
Platelets: 508 10*3/uL — ABNORMAL HIGH (ref 150–400)
RBC: 2.11 MIL/uL — ABNORMAL LOW (ref 4.22–5.81)
RDW: 18.9 % — AB (ref 11.5–15.5)
WBC: 18.9 10*3/uL — AB (ref 4.0–10.5)

## 2013-10-05 LAB — RETICULOCYTES
RBC.: 2.11 MIL/uL — ABNORMAL LOW (ref 4.22–5.81)
RETIC CT PCT: 15 % — AB (ref 0.4–3.1)
Retic Count, Absolute: 316.5 10*3/uL — ABNORMAL HIGH (ref 19.0–186.0)

## 2013-10-05 MED ORDER — SODIUM CHLORIDE 0.9 % IJ SOLN
INTRAMUSCULAR | Status: AC
  Administered 2013-10-05: 10 mL
  Filled 2013-10-05: qty 10

## 2013-10-05 MED ORDER — HYDROMORPHONE HCL PF 2 MG/ML IJ SOLN
2.0000 mg | Freq: Once | INTRAMUSCULAR | Status: AC
  Administered 2013-10-05: 2 mg via INTRAMUSCULAR
  Filled 2013-10-05: qty 1

## 2013-10-05 MED ORDER — SODIUM CHLORIDE 0.9 % IV BOLUS (SEPSIS)
1000.0000 mL | Freq: Once | INTRAVENOUS | Status: AC
  Administered 2013-10-05: 1000 mL via INTRAVENOUS

## 2013-10-05 MED ORDER — HYDROMORPHONE HCL PF 2 MG/ML IJ SOLN
2.0000 mg | INTRAMUSCULAR | Status: DC | PRN
  Administered 2013-10-05 (×2): 2 mg via INTRAVENOUS
  Filled 2013-10-05: qty 1

## 2013-10-05 MED ORDER — HYDROMORPHONE HCL PF 2 MG/ML IJ SOLN: 2.0000 mg | Freq: Once | INTRAMUSCULAR | Status: DC

## 2013-10-05 MED ORDER — DIPHENHYDRAMINE HCL 50 MG/ML IJ SOLN
25.0000 mg | Freq: Once | INTRAMUSCULAR | Status: AC
  Administered 2013-10-05: 25 mg via INTRAVENOUS
  Filled 2013-10-05: qty 1

## 2013-10-05 MED ORDER — HEPARIN SOD (PORK) LOCK FLUSH 100 UNIT/ML IV SOLN
500.0000 [IU] | INTRAVENOUS | Status: AC | PRN
  Administered 2013-10-05: 500 [IU]
  Filled 2013-10-05: qty 5

## 2013-10-05 MED ORDER — HYDROMORPHONE HCL PF 2 MG/ML IJ SOLN
2.0000 mg | INTRAMUSCULAR | Status: DC | PRN
  Filled 2013-10-05: qty 1

## 2013-10-05 MED ORDER — HYDROMORPHONE HCL 2 MG PO TABS
4.0000 mg | ORAL_TABLET | Freq: Once | ORAL | Status: AC
  Administered 2013-10-05: 4 mg via ORAL
  Filled 2013-10-05: qty 2

## 2013-10-05 MED ORDER — MORPHINE SULFATE ER 30 MG PO TBCR
30.0000 mg | EXTENDED_RELEASE_TABLET | Freq: Once | ORAL | Status: AC
  Administered 2013-10-05: 30 mg via ORAL
  Filled 2013-10-05: qty 2

## 2013-10-05 NOTE — ED Provider Notes (Signed)
CSN: [id number]     Arrival date & time 10/05/13  0718 History   First MD Initiated Contact with Patient 10/05/13 [phone]     Chief Complaint  Patient presents with  . Sickle Cell Pain Crisis     (Consider location/radiation/quality/duration/timing/severity/associated sxs/prior Treatment) HPI [name] is a 33 y.o. male with a history of sickle cell requiring transfusion, DVT and PE who presents today for evaluation of sickle cell pain crisis. Patient reports the pain woke him up this morning at 5:00 AM. He describes the pain as a throbbing sensation in his bilateral lateral ribs and his back. He says he took his home prescription for Dilaudid but it did not provide relief. He is out of his MS Contin and is refilling it today. Currently on Xarelto for anticoagulation therapy. He denies fevers, shortness of breath, chest pain, abdominal pain, nausea vomiting, numbness or weakness.  Past Medical History  Diagnosis Date  . Sickle cell anemia   . Blood transfusion   . 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 Hypokalemia   . Mood disorder   . History of pulmonary embolus (PE)   . Avascular necrosis   . Leukocytosis     Chronic  . Thrombocytosis     Chronic  . Hypertension   . History of Clostridium difficile infection   . Uses marijuana   . Chronic anticoagulation   . Functional asplenia   . Former smoker   . Second hand tobacco smoke exposure   . Alcohol consumption of one to four drinks per day    Past Surgical History  Procedure Laterality Date  . Right hip replacement      08/2006  . Cholecystectomy      01/2008  . Porta cath placement    . Porta cath removal    . Umbilical hernia repair      01/2008  . Excision of left periauricular cyst      10/2009  . Excision of right ear lobe cyst with primary closur      11/2007  . Portacath placement  01/05/2012    Procedure: INSERTION PORT-A-CATH;  Surgeon: Odis Hollingshead, MD;  Location: [facility];   Service: General;  Laterality: N/A;  ultrasound guiced port a cath insertion with fluoroscopy   Family History  Problem Relation Age of Onset  . Sickle cell anemia Mother   . Sickle cell anemia Father   . Sickle cell trait Brother    History  Substance Use Topics  . Smoking status: Former Smoker -- 13 years    Quit date: 07/08/2010  . Smokeless tobacco: Never Used  . Alcohol Use: No    Review of Systems  Constitutional: Negative for fever.  HENT: Negative for sore throat.   Eyes: Negative for visual disturbance.  Respiratory: Negative for shortness of breath.   Cardiovascular: Negative for chest pain.  Gastrointestinal: Negative for abdominal pain.  Endocrine: Negative for polyuria.  Genitourinary: Negative for dysuria.  Musculoskeletal: Positive for back pain and myalgias.  Skin: Negative for rash.  Neurological: Negative for headaches.  All other systems reviewed and are negative.     Allergies  Morphine and related  Home Medications   Prior to Admission medications   Medication Sig Start Date End Date Taking? Authorizing Provider  folic acid (FOLVITE) 1 MG tablet Take 1 tablet (1 mg total) by mouth every morning. 08/22/13  Yes [name], MD  HYDROmorphone (DILAUDID) 4 MG tablet Take  1 tablet (4 mg total) by mouth every 4 (four) hours as needed for severe pain. 09/26/13  Yes [name], MD  hydroxyurea (HYDREA) 500 MG capsule Take 500 mg by mouth daily. May take with food to minimize GI side effects.   Yes Historical Provider, MD  lisinopril (PRINIVIL,ZESTRIL) 10 MG tablet Take 1 tablet (10 mg total) by mouth daily. 08/22/13  Yes [name], MD  metoprolol tartrate (LOPRESSOR) 25 MG tablet Take 1 tablet (25 mg total) by mouth daily. 08/22/13  Yes [name], MD  morphine (MS CONTIN) 30 MG 12 hr tablet Take 1 tablet (30 mg total) by mouth every 12 (twelve) hours. 09/26/13  Yes [name], MD  potassium chloride SA  (K-DUR,KLOR-CON) 20 MEQ tablet Take 1 tablet (20 mEq total) by mouth every morning. 09/25/13  Yes [name], MD  rivaroxaban (XARELTO) 20 MG TABS tablet Take 1 tablet (20 mg total) by mouth every morning. 08/22/13  Yes [name], MD  zolpidem (AMBIEN) 10 MG tablet Take 1 tablet (10 mg total) by mouth at bedtime as needed for sleep. 09/26/13  Yes [name], MD   BP 144/78  Pulse 98  Temp(Src) 98.3 F (36.8 C) (Oral)  Resp 16  SpO2 94% Physical Exam  Nursing note and vitals reviewed. Constitutional: He is oriented to person, place, and time. He appears well-developed and well-nourished.  Appears mildly distressed due to pain  HENT:  Head: Normocephalic and atraumatic.  Mouth/Throat: Oropharynx is clear and moist.  Eyes: Conjunctivae are normal. Pupils are equal, round, and reactive to light. Right eye exhibits no discharge. Left eye exhibits no discharge. Scleral icterus is present.  Neck: Neck supple.  Cardiovascular: Normal rate, regular rhythm and normal heart sounds.   Pulmonary/Chest: Effort normal and breath sounds normal. No respiratory distress. He has no wheezes. He has no rales.  Abdominal: Soft. There is no tenderness.  Musculoskeletal: He exhibits no tenderness.  Neurological: He is alert and oriented to person, place, and time.  Cranial Nerves II-XII grossly intact  Skin: Skin is warm and dry. No rash noted.  Psychiatric: He has a normal mood and affect.    ED Course  Procedures (including critical care time) Labs Review Labs Reviewed  CBC WITH DIFFERENTIAL - Abnormal; Notable for the following:    WBC 18.9 (*)    RBC 2.11 (*)    Hemoglobin 6.9 (*)    HCT 20.2 (*)    RDW 18.9 (*)    Platelets 508 (*)    Neutro Abs 13.6 (*)    Monocytes Relative 13 (*)    Monocytes Absolute 2.4 (*)    Basophils Absolute 0.2 (*)    All other components within normal limits  COMPREHENSIVE METABOLIC PANEL - Abnormal; Notable for the following:    Sodium  136 (*)    AST 39 (*)    Total Bilirubin 6.7 (*)    All other components within normal limits  RETICULOCYTES - Abnormal; Notable for the following:    Retic Ct Pct 15.0 (*)    RBC. 2.11 (*)    Retic Count, Manual 316.5 (*)    All other components within normal limits   Hemoglobin  Date Value Ref Range Status  10/05/2013 6.9* 13.0 - 17.0 g/dL Final     REPEATED TO VERIFY     CRITICAL RESULT CALLED TO, READ BACK BY AND VERIFIED WITH:     [facility] RN AT 8416 09.05.15 BY Leveda Anna  09/24/2013 7.2* 13.0 - 17.0 g/dL Final  09/23/2013 7.3* 13.0 - 17.0 g/dL Final     DELTA CHECK NOTED     POST TRANSFUSION SPECIMEN  09/22/2013 4.9* 13.0 - 17.0 g/dL Final     REPEATED TO VERIFY     CRITICAL RESULT CALLED TO, READ BACK BY AND VERIFIED WITH:     J OXENDINE AT 2248 ON 08.23.2015 BY NBROOKS      Imaging Review Dg Chest 2 View  10/05/2013   CLINICAL DATA:  Sickle cell anemia crisis and back pain.  EXAM: CHEST - 2 VIEW  COMPARISON:  09/22/2013  FINDINGS: Port-A-Cath positioning is stable. Stable appearance of scattered areas of pulmonary scarring bilaterally. There is no evidence of pulmonary edema, consolidation, pneumothorax, nodule or pleural fluid. The heart size is stable and at the upper limits of normal. The visualized spine shows no fractures or significant degenerative disease.  IMPRESSION: No active disease.  Stable pulmonary scarring.   Electronically Signed   By: Aletta Edouard M.D.   On: 10/05/2013 08:16     EKG Interpretation None     Meds given in ED:  Medications  HYDROmorphone (DILAUDID) injection 2 mg (2 mg Intravenous Given 10/05/13 1023)  sodium chloride 0.9 % injection (10 mLs  Given 10/05/13 0754)  HYDROmorphone (DILAUDID) injection 2 mg (2 mg Intramuscular Given 10/05/13 0808)  sodium chloride 0.9 % bolus 1,000 mL (1,000 mLs Intravenous New Bag/Given 10/05/13 0807)  diphenhydrAMINE (BENADRYL) injection 25 mg (25 mg Intravenous Given 10/05/13 0904)  HYDROmorphone (DILAUDID)  tablet 4 mg (4 mg Oral Given 10/05/13 1114)  morphine (MS CONTIN) 12 hr tablet 30 mg (30 mg Oral Given 10/05/13 1114)  heparin lock flush 100 unit/mL (500 Units Intracatheter Given 10/05/13 1144)    Discharge Medication List as of 10/05/2013 11:25 AM     Filed Vitals:   10/05/13 0729 10/05/13 0917 10/05/13 0918 10/05/13 1127  BP: 131/82 128/73  144/78  Pulse: 93 93  98  Temp: 98.3 F (36.8 C)     TempSrc: Oral     Resp:    16  SpO2: 95% 89% 95% 94%    MDM  Vitals stable - WNL -afebrile Pt resting comfortably in ED. PE and CXR show no sign of new emergent pathology. Lab work appears baseline for pt. Dr. Venora Maples spoke with his PCP, she recommended giving 1 of his Dilaudid 4mg  PO, 1 MS Contin 30mg  and DC. She will come see him in ED. Pt says he feels much better now and is ready to go home. Discussed f/u with PCP and return precautions, pt very amenable to plan.   Final diagnoses:  Sickle cell anemia with pain  Prior to patient discharge, I discussed and reviewed this case with Dr.Campos        Verl Dicker, PA-C 10/05/13 1151

## 2013-10-05 NOTE — ED Notes (Signed)
PA Ben notified of critical results: Hgb 6.9

## 2013-10-05 NOTE — ED Provider Notes (Signed)
Medical screening examination/treatment/procedure(s) were conducted as a shared visit with non-physician practitioner(s) and myself.  I personally evaluated the patient during the encounter.   EKG Interpretation None      Pain control emergency department.  Feeling better.  Discharge home in good condition.  Hoy Morn, MD 10/05/13 1700

## 2013-10-05 NOTE — ED Notes (Signed)
Pt reports sickle cell crisis starting this morning;pt sts pain is mostly in his mid and lower back.

## 2013-10-06 IMAGING — CR DG HIP COMPLETE 2+V*R*
3 series · 3 of 3 positions shown · non-contrast
Comparison: Portable study from 08/03/2006

CLINICAL DATA: Right hip pain

RIGHT HIP - COMPLETE 2+ VIEW

[t pelvis a.p.]
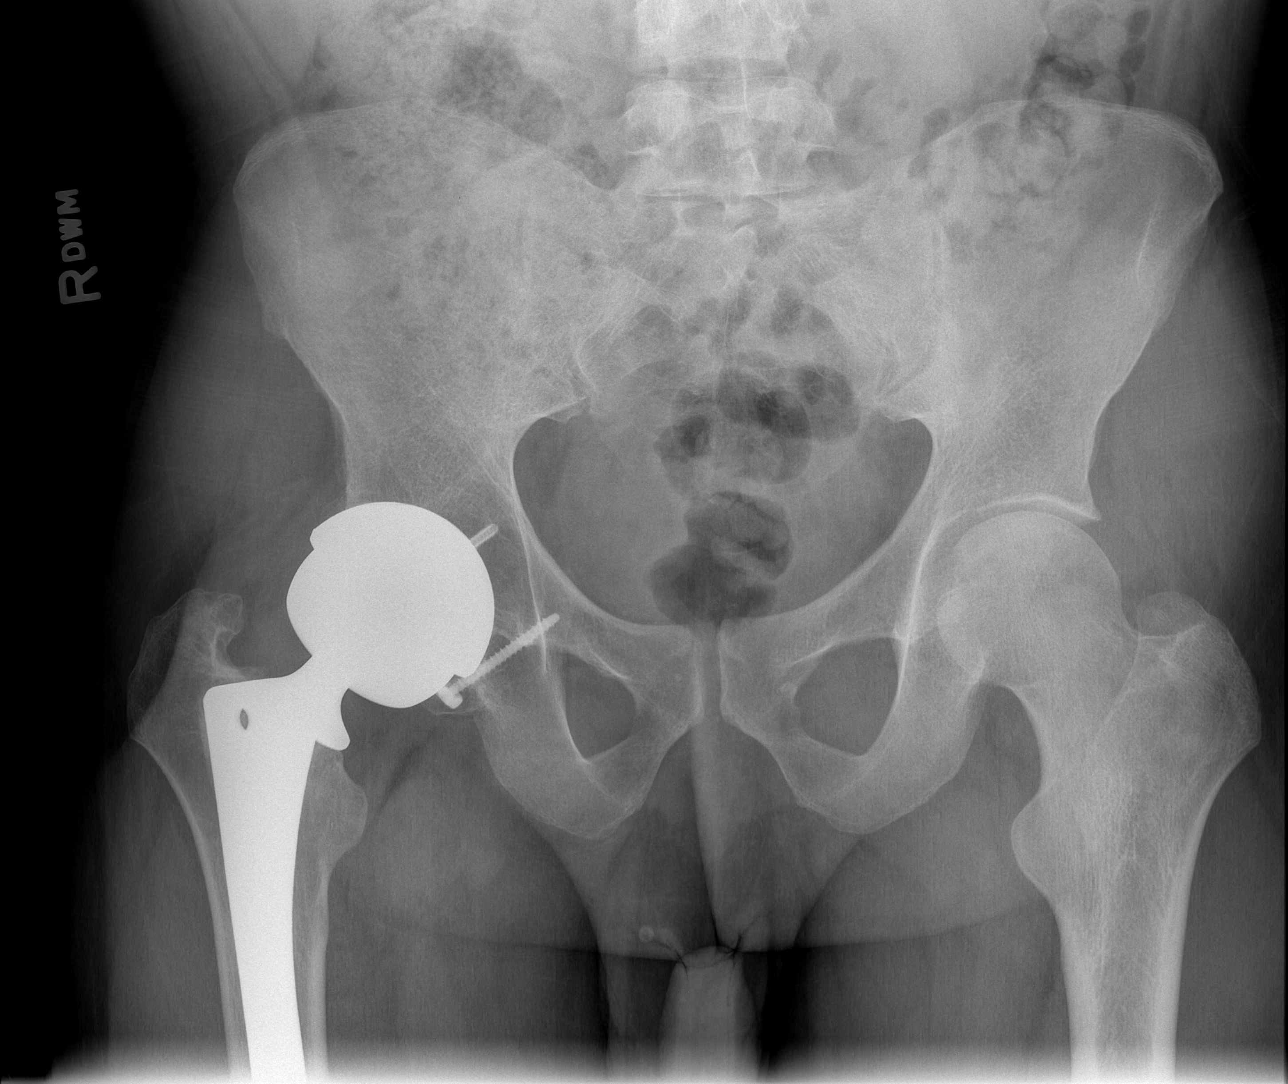

[t hip ap right]
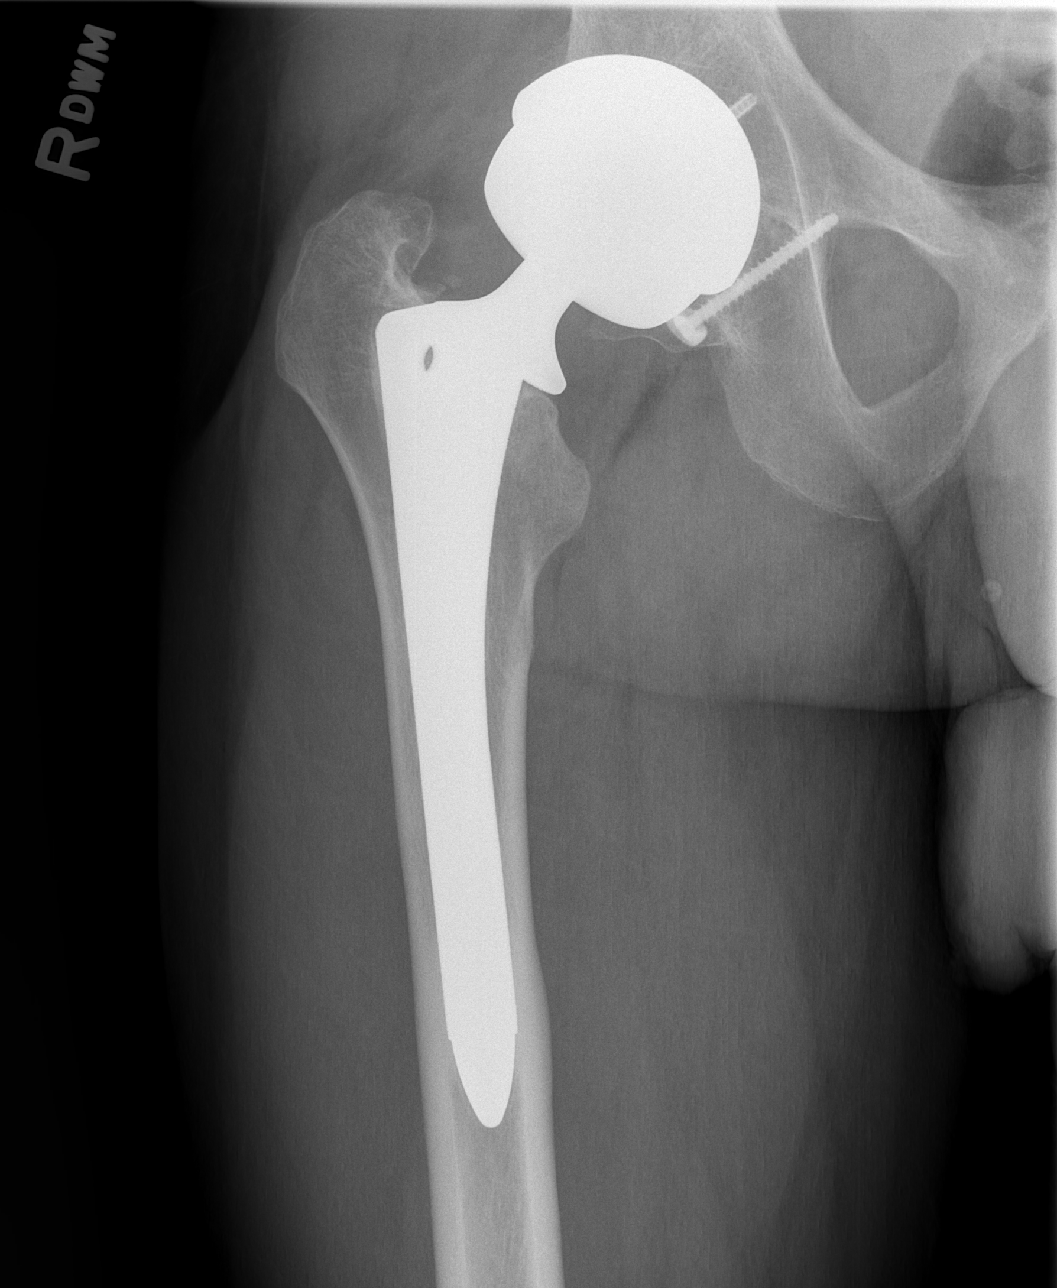

[t hip frog leg right]
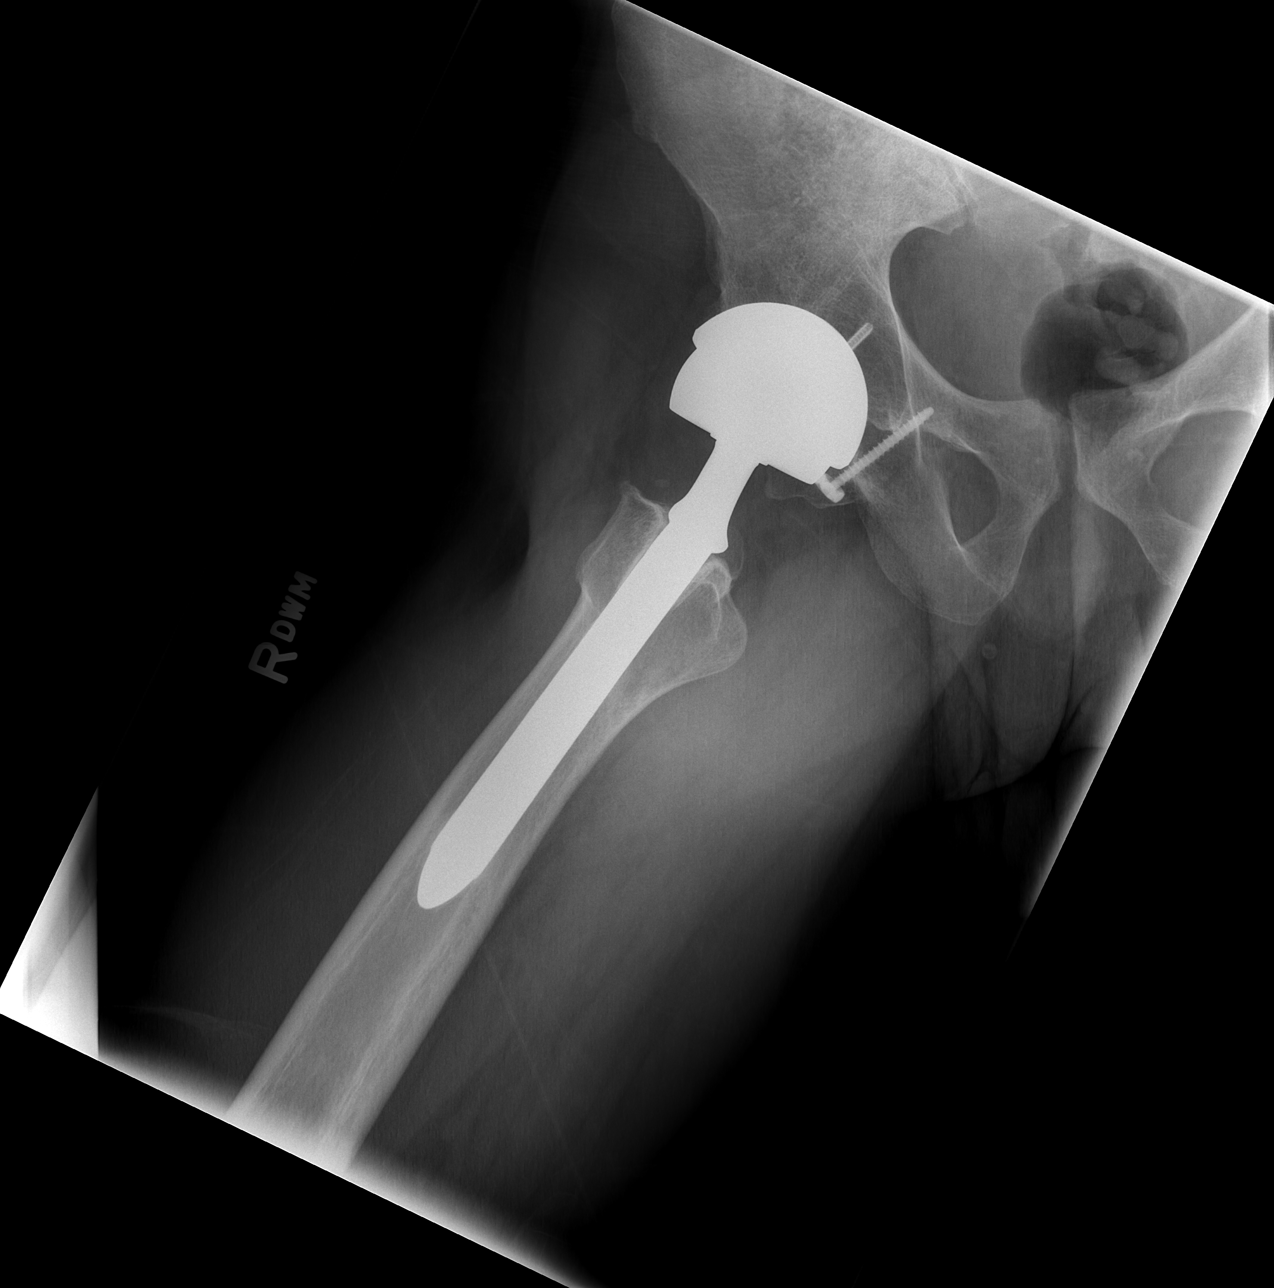

[3 of 3 positions shown; findings below may reference images not displayed]

FINDINGS: AP pelvis with two views of the right hip show the
patient to be status post total hip replacement.  There is no
fracture.  No evidence for hardware complications.
IMPRESSION: Stable appearance status post right total hip replacement.

## 2013-10-07 ENCOUNTER — Encounter (HOSPITAL_COMMUNITY): Payer: Self-pay | Admitting: Emergency Medicine

## 2013-10-07 ENCOUNTER — Inpatient Hospital Stay (HOSPITAL_COMMUNITY)
Admission: EM | Admit: 2013-10-07 | Discharge: 2013-10-09 | DRG: 812 | Disposition: A | Discharge status: Home / Self Care | Payer: Medicare Other | Attending: Internal Medicine | Admitting: Internal Medicine

## 2013-10-07 ENCOUNTER — Emergency Department (HOSPITAL_COMMUNITY): Payer: Medicare Other

## 2013-10-07 DIAGNOSIS — D5700 Hb-SS disease with crisis, unspecified: Secondary | ICD-10-CM | POA: Diagnosis present

## 2013-10-07 DIAGNOSIS — D72829 Elevated white blood cell count, unspecified: Secondary | ICD-10-CM

## 2013-10-07 DIAGNOSIS — I10 Essential (primary) hypertension: Secondary | ICD-10-CM | POA: Diagnosis not present

## 2013-10-07 DIAGNOSIS — D571 Sickle-cell disease without crisis: Secondary | ICD-10-CM

## 2013-10-07 DIAGNOSIS — D5701 Hb-SS disease with acute chest syndrome: Secondary | ICD-10-CM | POA: Diagnosis present

## 2013-10-07 LAB — CBC WITH DIFFERENTIAL/PLATELET
Basophils Absolute: 0.2 10*3/uL — ABNORMAL HIGH (ref 0.0–0.1)
Basophils Relative: 1 % (ref 0–1)
Eosinophils Absolute: 0 10*3/uL (ref 0.0–0.7)
Eosinophils Relative: 0 % (ref 0–5)
HCT: 24.2 % — ABNORMAL LOW (ref 39.0–52.0)
Hemoglobin: 8.1 g/dL — ABNORMAL LOW (ref 13.0–17.0)
Lymphocytes Relative: 5 % — ABNORMAL LOW (ref 12–46)
Lymphs Abs: 0.9 10*3/uL (ref 0.7–4.0)
MCH: 32.4 pg (ref 26.0–34.0)
MCHC: 33.5 g/dL (ref 30.0–36.0)
MCV: 96.8 fL (ref 78.0–100.0)
Monocytes Absolute: 1.5 10*3/uL — ABNORMAL HIGH (ref 0.1–1.0)
Monocytes Relative: 8 % (ref 3–12)
Neutro Abs: 15.7 10*3/uL — ABNORMAL HIGH (ref 1.7–7.7)
Neutrophils Relative %: 86 % — ABNORMAL HIGH (ref 43–77)
Platelets: 447 10*3/uL — ABNORMAL HIGH (ref 150–400)
RBC: 2.5 MIL/uL — ABNORMAL LOW (ref 4.22–5.81)
RDW: 21.5 % — ABNORMAL HIGH (ref 11.5–15.5)
WBC: 18.3 10*3/uL — ABNORMAL HIGH (ref 4.0–10.5)
nRBC: 4 /100 WBC — ABNORMAL HIGH

## 2013-10-07 LAB — COMPREHENSIVE METABOLIC PANEL
ALT: 17 U/L (ref 0–53)
AST: 31 U/L (ref 0–37)
Albumin: 4.1 g/dL (ref 3.5–5.2)
Alkaline Phosphatase: 82 U/L (ref 39–117)
Anion gap: 14 (ref 5–15)
BUN: 7 mg/dL (ref 6–23)
CO2: 22 mEq/L (ref 19–32)
Calcium: 9.2 mg/dL (ref 8.4–10.5)
Chloride: 103 mEq/L (ref 96–112)
Creatinine, Ser: 0.65 mg/dL (ref 0.50–1.35)
GFR calc Af Amer: >90 mL/min (ref >90)
GFR calc non Af Amer: >90 mL/min (ref >90)
Glucose, Bld: 116 mg/dL — ABNORMAL HIGH (ref 70–99)
Potassium: 3.6 mEq/L — ABNORMAL LOW (ref 3.7–5.3)
Sodium: 139 mEq/L (ref 137–147)
Total Bilirubin: 6.4 mg/dL — ABNORMAL HIGH (ref 0.3–1.2)
Total Protein: 7.5 g/dL (ref 6.0–8.3)

## 2013-10-07 LAB — TROPONIN I: Troponin I: <0.3 ng/mL (ref <0.30)

## 2013-10-07 LAB — RETICULOCYTES
RBC.: 2.5 MIL/uL — ABNORMAL LOW (ref 4.22–5.81)
Retic Count, Absolute: 627.5 10*3/uL — ABNORMAL HIGH (ref 19.0–186.0)
Retic Ct Pct: >23 % — ABNORMAL HIGH (ref 0.4–3.1)

## 2013-10-07 LAB — MRSA PCR SCREENING: MRSA BY PCR: POSITIVE — AB

## 2013-10-07 MED ORDER — FOLIC ACID 1 MG PO TABS: 1.0000 mg | ORAL_TABLET | Freq: Every day | ORAL | Status: DC

## 2013-10-07 MED ORDER — ONDANSETRON HCL 4 MG/2ML IJ SOLN: 4.0000 mg | Freq: Four times a day (QID) | INTRAMUSCULAR | Status: DC | PRN

## 2013-10-07 MED ORDER — HYDROMORPHONE HCL PF 2 MG/ML IJ SOLN
2.0000 mg | Freq: Once | INTRAMUSCULAR | Status: AC
  Administered 2013-10-07: 2 mg via INTRAVENOUS
  Filled 2013-10-07: qty 1

## 2013-10-07 MED ORDER — FOLIC ACID 1 MG PO TABS
1.0000 mg | ORAL_TABLET | Freq: Every morning | ORAL | Status: DC
  Administered 2013-10-08 – 2013-10-09 (×2): 1 mg via ORAL
  Filled 2013-10-07 (×2): qty 1

## 2013-10-07 MED ORDER — HYDROMORPHONE HCL 4 MG PO TABS: 4.0000 mg | ORAL_TABLET | ORAL | Status: DC | PRN

## 2013-10-07 MED ORDER — ENOXAPARIN SODIUM 40 MG/0.4ML ~~LOC~~ SOLN: 40.0000 mg | SUBCUTANEOUS | Status: DC

## 2013-10-07 MED ORDER — HYDROMORPHONE HCL PF 2 MG/ML IJ SOLN
3.0000 mg | Freq: Once | INTRAMUSCULAR | Status: AC
  Administered 2013-10-07: 3 mg via INTRAVENOUS
  Filled 2013-10-07 (×2): qty 2

## 2013-10-07 MED ORDER — LEVOFLOXACIN IN D5W 500 MG/100ML IV SOLN
500.0000 mg | INTRAVENOUS | Status: DC
  Filled 2013-10-07: qty 100

## 2013-10-07 MED ORDER — RIVAROXABAN 20 MG PO TABS: 20.0000 mg | ORAL_TABLET | Freq: Every morning | ORAL | Status: DC

## 2013-10-07 MED ORDER — KETOROLAC TROMETHAMINE 30 MG/ML IJ SOLN
30.0000 mg | Freq: Once | INTRAMUSCULAR | Status: AC
  Administered 2013-10-07: 30 mg via INTRAVENOUS
  Filled 2013-10-07: qty 1

## 2013-10-07 MED ORDER — HYDROXYUREA 500 MG PO CAPS
500.0000 mg | ORAL_CAPSULE | Freq: Every day | ORAL | Status: DC
  Administered 2013-10-07: 500 mg via ORAL
  Filled 2013-10-07 (×2): qty 1

## 2013-10-07 MED ORDER — LEVOFLOXACIN IN D5W 750 MG/150ML IV SOLN
750.0000 mg | INTRAVENOUS | Status: DC
  Administered 2013-10-07: 750 mg via INTRAVENOUS
  Filled 2013-10-07: qty 150

## 2013-10-07 MED ORDER — HYDROMORPHONE HCL PF 2 MG/ML IJ SOLN
3.0000 mg | Freq: Once | INTRAMUSCULAR | Status: AC
  Administered 2013-10-07: 3 mg via INTRAVENOUS
  Filled 2013-10-07: qty 2

## 2013-10-07 MED ORDER — LISINOPRIL 10 MG PO TABS
10.0000 mg | ORAL_TABLET | Freq: Every day | ORAL | Status: DC
  Administered 2013-10-07 – 2013-10-09 (×3): 10 mg via ORAL
  Filled 2013-10-07 (×3): qty 1

## 2013-10-07 MED ORDER — HYDROMORPHONE 0.3 MG/ML IV SOLN
INTRAVENOUS | Status: DC
  Administered 2013-10-07: 18:00:00 via INTRAVENOUS
  Administered 2013-10-07: 2.1 mg via INTRAVENOUS
  Administered 2013-10-07: 1.7 mg via INTRAVENOUS
  Administered 2013-10-08: 13:00:00 via INTRAVENOUS
  Administered 2013-10-08: 2.7 mg via INTRAVENOUS
  Administered 2013-10-08 (×2): via INTRAVENOUS
  Administered 2013-10-08: 3.6 mg via INTRAVENOUS
  Administered 2013-10-08: 3.9 mg via INTRAVENOUS
  Administered 2013-10-09: 0.3 mg via INTRAVENOUS
  Administered 2013-10-09: 4.5 mg via INTRAVENOUS
  Administered 2013-10-09: 0.3 mg via INTRAVENOUS
  Administered 2013-10-09: 11:00:00 via INTRAVENOUS
  Filled 2013-10-07 (×6): qty 25

## 2013-10-07 MED ORDER — METOPROLOL TARTRATE 25 MG PO TABS
25.0000 mg | ORAL_TABLET | Freq: Every day | ORAL | Status: DC
  Administered 2013-10-07 – 2013-10-09 (×3): 25 mg via ORAL
  Filled 2013-10-07 (×3): qty 1

## 2013-10-07 MED ORDER — NALOXONE HCL 0.4 MG/ML IJ SOLN: 0.4000 mg | INTRAMUSCULAR | Status: DC | PRN

## 2013-10-07 MED ORDER — DIPHENHYDRAMINE HCL 50 MG/ML IJ SOLN
12.5000 mg | Freq: Four times a day (QID) | INTRAMUSCULAR | Status: DC | PRN
  Administered 2013-10-08: 12.5 mg via INTRAVENOUS
  Filled 2013-10-07: qty 1

## 2013-10-07 MED ORDER — DIPHENHYDRAMINE HCL 12.5 MG/5ML PO ELIX
12.5000 mg | ORAL_SOLUTION | Freq: Four times a day (QID) | ORAL | Status: DC | PRN
Start: 2013-10-07 — End: 2013-10-09
  Administered 2013-10-08 – 2013-10-09 (×4): 12.5 mg via ORAL
  Filled 2013-10-07 (×4): qty 5

## 2013-10-07 MED ORDER — RIVAROXABAN 20 MG PO TABS
20.0000 mg | ORAL_TABLET | Freq: Every day | ORAL | Status: DC
  Administered 2013-10-07 – 2013-10-09 (×3): 20 mg via ORAL
  Filled 2013-10-07 (×3): qty 1

## 2013-10-07 MED ORDER — MUPIROCIN 2 % EX OINT
1.0000 "application " | TOPICAL_OINTMENT | Freq: Two times a day (BID) | CUTANEOUS | Status: DC
  Administered 2013-10-08 – 2013-10-09 (×3): 1 via NASAL
  Filled 2013-10-07 (×2): qty 22

## 2013-10-07 MED ORDER — SODIUM CHLORIDE 0.9 % IJ SOLN
9.0000 mL | INTRAMUSCULAR | Status: DC | PRN
Start: 2013-10-07 — End: 2013-10-09

## 2013-10-07 MED ORDER — DEXTROSE-NACL 5-0.45 % IV SOLN
INTRAVENOUS | Status: DC
  Administered 2013-10-07: 11:00:00 via INTRAVENOUS

## 2013-10-07 MED ORDER — POTASSIUM CHLORIDE CRYS ER 20 MEQ PO TBCR
20.0000 meq | EXTENDED_RELEASE_TABLET | Freq: Every morning | ORAL | Status: DC
  Administered 2013-10-08 – 2013-10-09 (×2): 20 meq via ORAL
  Filled 2013-10-07 (×2): qty 1

## 2013-10-07 MED ORDER — DIPHENHYDRAMINE HCL 50 MG/ML IJ SOLN
25.0000 mg | Freq: Once | INTRAMUSCULAR | Status: AC
  Administered 2013-10-07: 25 mg via INTRAVENOUS
  Filled 2013-10-07 (×3): qty 1

## 2013-10-07 MED ORDER — MORPHINE SULFATE ER 30 MG PO TBCR
30.0000 mg | EXTENDED_RELEASE_TABLET | Freq: Two times a day (BID) | ORAL | Status: DC
  Administered 2013-10-07 – 2013-10-09 (×4): 30 mg via ORAL
  Filled 2013-10-07 (×4): qty 1

## 2013-10-07 MED ORDER — ZOLPIDEM TARTRATE 10 MG PO TABS
10.0000 mg | ORAL_TABLET | Freq: Every evening | ORAL | Status: DC | PRN
  Administered 2013-10-07 – 2013-10-08 (×2): 10 mg via ORAL
  Filled 2013-10-07 (×2): qty 1

## 2013-10-07 MED ORDER — ONDANSETRON HCL 4 MG/2ML IJ SOLN
4.0000 mg | Freq: Once | INTRAMUSCULAR | Status: AC
  Administered 2013-10-07: 4 mg via INTRAVENOUS
  Filled 2013-10-07: qty 2

## 2013-10-07 MED ORDER — LEVOFLOXACIN IN D5W 500 MG/100ML IV SOLN: 500.0000 mg | INTRAVENOUS | Status: DC

## 2013-10-07 MED ORDER — CHLORHEXIDINE GLUCONATE CLOTH 2 % EX PADS
6.0000 | MEDICATED_PAD | Freq: Every day | CUTANEOUS | Status: DC
  Administered 2013-10-08: 6 via TOPICAL

## 2013-10-07 MED ORDER — SODIUM CHLORIDE 0.9 % IV SOLN
INTRAVENOUS | Status: DC
  Administered 2013-10-07 – 2013-10-08 (×2): via INTRAVENOUS

## 2013-10-07 NOTE — Progress Notes (Signed)
  CARE MANAGEMENT ED NOTE 10/07/2013  Patient:  [name], [name]   Account Number:  [id number]  Date Initiated:  10/07/2013  Documentation initiated by:  Livia Snellen  Subjective/Objective Assessment:   Patient presents to Ed with chest pain, increased malaise, fatigue and decreased oral intake.     Subjective/Objective Assessment Detail:     Action/Plan:   Action/Plan Detail:   Anticipated DC Date:       Status Recommendation to Physician:   Result of Recommendation:    Other ED Services  Consult Working [place]  Other    Choice offered to / List presented to:            Status of service:  Completed, signed off  ED Comments:   ED Comments Detail:  EDCM spoke to patient at bedside. Patient restless in the bed.  Patient noted to be seen in the ED nine times in six months with 20 admissions in six months.  Patient confirmed his pcp is Dr. Liston Alba.  [facility] asked patient if he was able to see his pcp between admissions?  Patient stated, "I have an appointment tomorrow, and I saw her here the day before yesterday."  Patient was noted to be in the ED on 09/05.  Patient confirms he has received a follow up phone call post discharge on 08/25.  This information was placed in Readmission focus note.  No further EDCM needs at this time.

## 2013-10-07 NOTE — ED Notes (Signed)
Pt reports sickle cell pain crisis that started this morning. Rib, back pain, and leg pain 9/10.

## 2013-10-07 NOTE — Progress Notes (Signed)
ANTICOAGULATION CONSULT NOTE - Initial Consult  Pharmacy Consult for Xarelto Indication: h/o PE  Allergies  Allergen Reactions  . Morphine And Related Hives and Rash    Pt states he can take PO, but not IV and he is able to tolerate Dilaudid with no reactions.    Patient Measurements:     Vital Signs: Temp: 99 F (37.2 C) (09/07 0924) Temp src: Oral (09/07 0924) BP: 122/67 mmHg (09/07 1445) Pulse Rate: 99 (09/07 1520)  Labs:  Recent Labs  10/05/13 0754 10/07/13 0954 10/07/13 1454  HGB 6.9* 8.1*  --   HCT 20.2* 24.2*  --   PLT 508* 447*  --   CREATININE 0.63 0.65  --   TROPONINI  --   --  <0.30    The CrCl is unknown because both a height and weight (above a minimum accepted value) are required for this calculation.   Medical History: Past Medical History  Diagnosis Date  . Sickle cell anemia   . Blood transfusion   . 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 Hypokalemia   . Mood disorder   . History of pulmonary embolus (PE)   . Avascular necrosis   . Leukocytosis     Chronic  . Thrombocytosis     Chronic  . Hypertension   . History of Clostridium difficile infection   . Uses marijuana   . Chronic anticoagulation   . Functional asplenia   . Former smoker   . Second hand tobacco smoke exposure   . Alcohol consumption of one to four drinks per day      Assessment: 32 yoM with h/o sickle cell anemia, DVT/PE who presents today for evaluation of sickle cell pain crisis. Pharmacy consulted to assist in resuming Xarelto patient was on at home.   Home Xarelto dose : 20mg  daily  Indication: h/o DVT/PE  Renal: SCr 0.65 CrCl > 100.   No current drug interactions  Goal of Therapy:  Anticoagulation for h/o PE/DVT with Xarelto   Plan:  Continue Xarelto 20mg  po daily with evening meal Follow renal function and adjust dosing if warrented  Kizzie Furnish, PharmD Pager: [phone] 10/07/2013 4:20 PM

## 2013-10-07 NOTE — ED Notes (Signed)
Pt returned from the restroom.

## 2013-10-07 NOTE — ED Notes (Signed)
Bed: WA03 Expected date:  Expected time:  Means of arrival:  Comments: 

## 2013-10-07 NOTE — ED Notes (Signed)
MD at bedside. 

## 2013-10-07 NOTE — H&P (Signed)
[facility] History and Physical  [name] CVE:[id number] DOB: 09/07/1980 DOA: 10/07/2013  Referring physician: ED physician PCP: [name],[name], MD   Chief Complaint: ribs pain   HPI:  Patient is 33 year old male with sickle cell disease, chronic anemia secondary to sickle cell, history of pulmonary embolism requiring throughout toe, pulmonary hypertension, presents to [facility] long emergency department with main concern of several days duration of progressively worsening generalized chest pain, all around the ribs. Patient describes it as constant, sharp, 10 out of 10 in severity, associated with malaise and fatigue, poor oral intake. Patient denies any specific alleviating factors. He also explains at home pain medications are not helpful in alleviating the pain. He also reports associated nausea but no vomiting, denies fevers and chills. Patient denies recent sick contacts or exposures. He did notice intermittently productive cough of clear sputum.  Assessment and Plan: Active Problems: Acute sickle cell crisis - Chest x-ray concerning for bronchopneumonia - Admit to telemetry bed due to tachycardia and chest pain - Start with providing supportive care with Dilaudid PCA, continue home medication regimen - Placed on empiric Levaquin IV for now and taper down antibiotics as clinically indicated - Placed on IV fluids, oxygen, K pad as needed - Currently no indication for transfusion, repeat CBC in the morning Anemia of chronic disease  - Review of records indicate baseline hemoglobin around 7  - Hemoglobin on admission 8.1 indicative of possible hemoconcentration secondary to poor oral intake  - IV fluids to be provided and will repeat CBC in the morning  Leukocytosis  - Secondary to stress reaction in the setting of acute sickle cell crisis - possibly also from bronchopneumonia  - Levaquin will be started IV and will repeat CBC in the morning  Hypokalemia  - will  supplement and repeat BMP in the morning Hx of pulmonary embolus - continue Xarelto. There is no evidence of bleeding    Radiological Exams on Admission: Dg Chest 2 View  10/07/2013   Mild prominence of the perihilar markings which may be due to mild vascular congestion versus viral bronchopneumonia.  Stable cardiomegaly.     Code Status: Full Family Communication: Pt at bedside Disposition Plan: Admit for further evaluation     Review of Systems:  Constitutional: Negative for fever, chills. Negative for diaphoresis.  HENT: Negative for hearing loss, ear pain, nosebleeds, congestion, sore throat, neck pain, tinnitus and ear discharge.   Eyes: Negative for blurred vision, double vision, photophobia, pain, discharge and redness.  Respiratory: Negative for shortness of breath, wheezing and stridor.   Cardiovascular: Negative for claudication and leg swelling.  Gastrointestinal: Negative for nausea, vomiting and abdominal pain.  Genitourinary: Negative for dysuria, urgency, frequency, hematuria and flank pain.  Musculoskeletal: Negative for myalgias, back pain, joint pain and falls.  Skin: Negative for itching and rash.  Neurological: Negative for dizziness and weakness. Endo/Heme/Allergies: Negative for environmental allergies and polydipsia. Does not bruise/bleed easily.  Psychiatric/Behavioral: Negative for suicidal ideas. The patient is not nervous/anxious.      Past Medical History  Diagnosis Date  . Sickle cell anemia   . Blood transfusion   . 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 Hypokalemia   . Mood disorder   . History of pulmonary embolus (PE)   . Avascular necrosis   . Leukocytosis     Chronic  . Thrombocytosis     Chronic  . Hypertension   . History of Clostridium difficile infection   .  Uses marijuana   . Chronic anticoagulation   . Functional asplenia   . Former smoker   . Second hand tobacco smoke exposure   . Alcohol consumption of  one to four drinks per day     Past Surgical History  Procedure Laterality Date  . Right hip replacement      08/2006  . Cholecystectomy      01/2008  . Porta cath placement    . Porta cath removal    . Umbilical hernia repair      01/2008  . Excision of left periauricular cyst      10/2009  . Excision of right ear lobe cyst with primary closur      11/2007  . Portacath placement  01/05/2012    Procedure: INSERTION PORT-A-CATH;  Surgeon: Odis Hollingshead, MD;  Location: [place];  Service: General;  Laterality: N/A;  ultrasound guiced port a cath insertion with fluoroscopy    Social History:  reports that he quit smoking about 3 years ago. He has never used smokeless tobacco. He reports that he uses illicit drugs about twice per week. He reports that he does not drink alcohol.  Allergies  Allergen Reactions  . Morphine And Related Hives and Rash    Pt states he can take PO, but not IV and he is able to tolerate Dilaudid with no reactions.    Family History  Problem Relation Age of Onset  . Sickle cell anemia Mother   . Sickle cell anemia Father   . Sickle cell trait Brother     Medication Sig  folic acid (FOLVITE) 1 MG tablet Take 1 tablet (1 mg total) by mouth every morning.  HYDROmorphone (DILAUDID) 4 MG tablet Take 1 tablet (4 mg total) by mouth every 4 (four) hours as needed for severe pain.  hydroxyurea (HYDREA) 500 MG capsule Take 500 mg by mouth daily. May take with food to minimize GI side effects.  lisinopril (PRINIVIL,ZESTRIL) 10 MG tablet Take 1 tablet (10 mg total) by mouth daily.  metoprolol tartrate (LOPRESSOR) 25 MG tablet Take 1 tablet (25 mg total) by mouth daily.  morphine (MS CONTIN) 30 MG 12 hr tablet Take 1 tablet (30 mg total) by mouth every 12 (twelve) hours.  potassium chloride SA (K-DUR,KLOR-CON) 20 MEQ tablet Take 1 tablet (20 mEq total) by mouth every morning.  rivaroxaban (XARELTO) 20 MG TABS tablet Take 1 tablet (20 mg total) by mouth every morning.   zolpidem (AMBIEN) 10 MG tablet Take 1 tablet (10 mg total) by mouth at bedtime as needed for sleep.    Physical Exam: Filed Vitals:   10/07/13 1354 10/07/13 1400 10/07/13 1445 10/07/13 1520  BP:  116/69 122/67   Pulse:  84 99 99  Temp:      TempSrc:      Resp:      SpO2: 95% 94% 91% 92%    Physical Exam  Constitutional: Appears well-developed and well-nourished. In mild distress due to pain.  HENT: Normocephalic. External right and left ear normal. Dry MM Eyes: Conjunctivae and EOM are normal. PERRLA, no scleral icterus.  Neck: Normal ROM. Neck supple. No JVD. No tracheal deviation. No thyromegaly.  CVS: RRR, S1/S2 +, no murmurs, no gallops, no carotid bruit.  Pulmonary: Effort and breath sounds normal, no stridor, mild rhonchi at bases  Abdominal: Soft. BS +,  no distension, tenderness, rebound or guarding.  Musculoskeletal: Normal range of motion. No edema and no tenderness.  Lymphadenopathy: No lymphadenopathy noted, cervical,  inguinal. Neuro: Alert. Normal reflexes, muscle tone coordination. No cranial nerve deficit. Skin: Skin is warm and dry. No rash noted. Not diaphoretic. No erythema. No pallor.  Psychiatric: Normal mood and affect. Behavior, judgment, thought content normal.   Labs on Admission:  Basic Metabolic Panel:  Recent Labs Lab 10/05/13 0754 10/07/13 0954  NA 136* 139  K 3.9 3.6*  CL 102 103  CO2 20 22  GLUCOSE 96 116*  BUN 8 7  CREATININE 0.63 0.65  CALCIUM 9.0 9.2   Liver Function Tests:  Recent Labs Lab 10/05/13 0754 10/07/13 0954  AST 39* 31  ALT 19 17  ALKPHOS 84 82  BILITOT 6.7* 6.4*  PROT 7.4 7.5  ALBUMIN 4.0 4.1   CBC:  Recent Labs Lab 10/05/13 0754 10/07/13 0954  WBC 18.9* 18.3*  NEUTROABS 13.6* 15.7*  HGB 6.9* 8.1*  HCT 20.2* 24.2*  MCV 95.7 96.8  PLT 508* 447*   Cardiac Enzymes:  Recent Labs Lab 10/07/13 1454  TROPONINI <0.30   EKG: Normal sinus rhythm, no ST/T wave changes  Faye Ramsay, MD  [facility] Pager [phone]  If 7PM-7AM, please contact night-coverage [email or web address] Password [email or web address] 10/07/2013, 3:59 PM

## 2013-10-07 NOTE — Progress Notes (Signed)
Called to ED for report. Waited on hold 5 minutes. Will try to call back. Stacey Drain

## 2013-10-07 NOTE — ED Provider Notes (Signed)
CSN: [id number]     Arrival date & time 10/07/13  0915 History   First MD Initiated Contact with Patient 10/07/13 [phone]     Chief Complaint  Patient presents with  . Sickle Cell Pain Crisis     (Consider location/radiation/quality/duration/timing/severity/associated sxs/prior Treatment) HPI  89y male with a history of sickle cell anemia, DVT/PE who presents today for evaluation of what he is calling sickle cell pain crisis. Patient reports the pain woke him up this morning. He describes the pain as a throbbing sensation in his bilateral lateral ribs and his back. He says he took prescribed dilaudid, MS contin without much relief.  Currently on Xarelto and reports compliance. He denies fevers, shortness of breath, abdominal pain, nausea/vomiting, joint swelling, rash,  numbness or weakness.    Past Medical History  Diagnosis Date  . Sickle cell anemia   . Blood transfusion   . 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 Hypokalemia   . Mood disorder   . History of pulmonary embolus (PE)   . Avascular necrosis   . Leukocytosis     Chronic  . Thrombocytosis     Chronic  . Hypertension   . History of Clostridium difficile infection   . Uses marijuana   . Chronic anticoagulation   . Functional asplenia   . Former smoker   . Second hand tobacco smoke exposure   . Alcohol consumption of one to four drinks per day    Past Surgical History  Procedure Laterality Date  . Right hip replacement      08/2006  . Cholecystectomy      01/2008  . Porta cath placement    . Porta cath removal    . Umbilical hernia repair      01/2008  . Excision of left periauricular cyst      10/2009  . Excision of right ear lobe cyst with primary closur      11/2007  . Portacath placement  01/05/2012    Procedure: INSERTION PORT-A-CATH;  Surgeon: Odis Hollingshead, MD;  Location: [facility];  Service: General;  Laterality: N/A;  ultrasound guiced port a cath insertion with fluoroscopy    Family History  Problem Relation Age of Onset  . Sickle cell anemia Mother   . Sickle cell anemia Father   . Sickle cell trait Brother    History  Substance Use Topics  . Smoking status: Former Smoker -- 13 years    Quit date: 07/08/2010  . Smokeless tobacco: Never Used  . Alcohol Use: No    Review of Systems  All systems reviewed and negative, other than as noted in HPI.   Allergies  Morphine and related  Home Medications   Prior to Admission medications   Medication Sig Start Date End Date Taking? Authorizing Provider  folic acid (FOLVITE) 1 MG tablet Take 1 tablet (1 mg total) by mouth every morning. 08/22/13  Yes [name], MD  HYDROmorphone (DILAUDID) 4 MG tablet Take 1 tablet (4 mg total) by mouth every 4 (four) hours as needed for severe pain. 09/26/13  Yes [name], MD  hydroxyurea (HYDREA) 500 MG capsule Take 500 mg by mouth daily. May take with food to minimize GI side effects.   Yes Historical Provider, MD  lisinopril (PRINIVIL,ZESTRIL) 10 MG tablet Take 1 tablet (10 mg total) by mouth daily. 08/22/13  Yes [name], MD  metoprolol tartrate (LOPRESSOR) 25 MG tablet Take 1 tablet (25  mg total) by mouth daily. 08/22/13  Yes [name], MD  morphine (MS CONTIN) 30 MG 12 hr tablet Take 1 tablet (30 mg total) by mouth every 12 (twelve) hours. 09/26/13  Yes [name], MD  potassium chloride SA (K-DUR,KLOR-CON) 20 MEQ tablet Take 1 tablet (20 mEq total) by mouth every morning. 09/25/13  Yes [name], MD  rivaroxaban (XARELTO) 20 MG TABS tablet Take 1 tablet (20 mg total) by mouth every morning. 08/22/13  Yes [name], MD  zolpidem (AMBIEN) 10 MG tablet Take 1 tablet (10 mg total) by mouth at bedtime as needed for sleep. 09/26/13  Yes [name], MD   BP 121/76  Pulse 105  Temp(Src) 99 F (37.2 C) (Oral)  Resp 18  SpO2 95% Physical Exam  Nursing note and vitals reviewed. Constitutional: He  appears well-developed and well-nourished. No distress.  HENT:  Head: Normocephalic and atraumatic.  Eyes: Right eye exhibits no discharge. Left eye exhibits no discharge. Scleral icterus is present.  Neck: Neck supple.  Cardiovascular: Normal rate, regular rhythm and normal heart sounds.  Exam reveals no gallop and no friction rub.   No murmur heard. Pulmonary/Chest: Effort normal and breath sounds normal. No respiratory distress.  Abdominal: Soft. He exhibits no distension. There is no tenderness.  Musculoskeletal: He exhibits no edema and no tenderness.  Lower extremities symmetric as compared to each other. No calf tenderness. Negative Homan's. No palpable cords.   Neurological: He is alert.  Skin: Skin is warm and dry.  Psychiatric: He has a normal mood and affect. His behavior is normal. Thought content normal.    ED Course  Procedures (including critical care time) Labs Review Labs Reviewed  CBC WITH DIFFERENTIAL - Abnormal; Notable for the following:    WBC 18.3 (*)    RBC 2.50 (*)    Hemoglobin 8.1 (*)    HCT 24.2 (*)    RDW 21.5 (*)    Platelets 447 (*)    Neutrophils Relative % 86 (*)    Lymphocytes Relative 5 (*)    nRBC 4 (*)    Neutro Abs 15.7 (*)    Monocytes Absolute 1.5 (*)    Basophils Absolute 0.2 (*)    All other components within normal limits  COMPREHENSIVE METABOLIC PANEL - Abnormal; Notable for the following:    Potassium 3.6 (*)    Glucose, Bld 116 (*)    Total Bilirubin 6.4 (*)    All other components within normal limits  RETICULOCYTES - Abnormal; Notable for the following:    Retic Ct Pct >23.0 (*)    RBC. 2.50 (*)    Retic Count, Manual 627.5 (*)    All other components within normal limits  TROPONIN I    Imaging Review Dg Chest 2 View  10/07/2013   CLINICAL DATA:  Sickle-cell crisis.  EXAM: CHEST  2 VIEW  COMPARISON:  10/05/2013  FINDINGS: Left subclavian Port-A-Cath is unchanged. Lungs are somewhat hypoinflated with prominence of the  perihilar markings. Mild stable cardiomegaly. Remainder the exam is unchanged.  IMPRESSION: Mild prominence of the perihilar markings which may be due to mild vascular congestion versus viral bronchopneumonia.  Stable cardiomegaly.   Electronically Signed   By: Marin Olp M.D.   On: 10/07/2013 13:49     EKG Interpretation   Date/Time:  Monday October 07 2013 15:04:52 EDT Ventricular Rate:  67 PR Interval:  168 QRS Duration: 110 QT Interval:  414 QTC Calculation: 437 R  Axis:   -63 Text Interpretation:  Sinus rhythm Atrial premature complex Consider left  atrial enlargement LAD, consider left anterior fascicular block Confirmed  by Nioka Thorington  MD, Trei Schoch ([id number]) on 10/07/2013 3:18:54 PM      MDM   Final diagnoses:  Acute chest syndrome   33yM with sickle cell anemia presenting with symptoms he attributes pain crisis. Hypoxic. He does endorse "rib pain."  o2 sats did drop into 80%s. This was after administration of dilaudid, but he does not appear overly sedated and on presentation before meds o2 sat was 90% on RA.  CXR as above. Some concern for possible acute chest. Abx. Hx of PE, but on xarleto. Reports compliance. Anemic, but improved from 2 days ago and reported baseline 7.5-8.5.     Virgel Manifold, MD 10/07/13 [phone]

## 2013-10-07 NOTE — ED Notes (Signed)
Oxygen desaturation after Dilaudid administration. Pt placed on 3L O2. Will continue to monitor.

## 2013-10-07 NOTE — ED Notes (Signed)
Pt ambulating to restroom with steady gait

## 2013-10-07 NOTE — ED Notes (Signed)
MD Kohut at bedside discussing with patient the importance of increasing his oxygen level before administration of more dilaudid. Pt aware and verbalizes understanding.

## 2013-10-07 NOTE — ED Notes (Signed)
Pt ambulated to restroom with steady gait.

## 2013-10-08 ENCOUNTER — Ambulatory Visit: Payer: Self-pay | Admitting: Family Medicine

## 2013-10-08 DIAGNOSIS — D72829 Elevated white blood cell count, unspecified: Secondary | ICD-10-CM

## 2013-10-08 DIAGNOSIS — D5700 Hb-SS disease with crisis, unspecified: Principal | ICD-10-CM

## 2013-10-08 LAB — BASIC METABOLIC PANEL
Anion gap: 12 (ref 5–15)
BUN: 6 mg/dL (ref 6–23)
CALCIUM: 8.8 mg/dL (ref 8.4–10.5)
CO2: 23 mEq/L (ref 19–32)
Chloride: 106 mEq/L (ref 96–112)
Creatinine, Ser: 0.63 mg/dL (ref 0.50–1.35)
GFR calc Af Amer: >90 mL/min (ref >90)
GFR calc non Af Amer: >90 mL/min (ref >90)
GLUCOSE: 109 mg/dL — AB (ref 70–99)
Potassium: 3.9 mEq/L (ref 3.7–5.3)
Sodium: 141 mEq/L (ref 137–147)

## 2013-10-08 LAB — CBC WITH DIFFERENTIAL/PLATELET
BAND NEUTROPHILS: 0 % (ref 0–10)
BLASTS: 0 %
Basophils Absolute: 0 10*3/uL (ref 0.0–0.1)
Basophils Relative: 0 % (ref 0–1)
EOS ABS: 0.6 10*3/uL (ref 0.0–0.7)
Eosinophils Relative: 3 % (ref 0–5)
HCT: 19.8 % — ABNORMAL LOW (ref 39.0–52.0)
Hemoglobin: 6.8 g/dL — CL (ref 13.0–17.0)
Lymphocytes Relative: 15 % (ref 12–46)
Lymphs Abs: 3.2 10*3/uL (ref 0.7–4.0)
MCH: 33.5 pg (ref 26.0–34.0)
MCHC: 34.3 g/dL (ref 30.0–36.0)
MCV: 97.5 fL (ref 78.0–100.0)
METAMYELOCYTES PCT: 0 %
Monocytes Absolute: 2.7 10*3/uL — ABNORMAL HIGH (ref 0.1–1.0)
Monocytes Relative: 13 % — ABNORMAL HIGH (ref 3–12)
Myelocytes: 0 %
Neutro Abs: 14.5 10*3/uL — ABNORMAL HIGH (ref 1.7–7.7)
Neutrophils Relative %: 69 % (ref 43–77)
Platelets: 432 10*3/uL — ABNORMAL HIGH (ref 150–400)
Promyelocytes Absolute: 0 %
RBC: 2.03 MIL/uL — ABNORMAL LOW (ref 4.22–5.81)
RDW: 21.3 % — AB (ref 11.5–15.5)
WBC: 21 10*3/uL — AB (ref 4.0–10.5)
nRBC: 4 /100 WBC — ABNORMAL HIGH

## 2013-10-08 LAB — HEMOGLOBIN AND HEMATOCRIT, BLOOD
HEMATOCRIT: 20.4 % — AB (ref 39.0–52.0)
Hemoglobin: 6.8 g/dL — CL (ref 13.0–17.0)

## 2013-10-08 LAB — LEGIONELLA ANTIGEN, URINE: Legionella Antigen, Urine: NEGATIVE

## 2013-10-08 LAB — LACTATE DEHYDROGENASE: LDH: 472 U/L — ABNORMAL HIGH (ref 94–250)

## 2013-10-08 LAB — STREP PNEUMONIAE URINARY ANTIGEN: Strep Pneumo Urinary Antigen: NEGATIVE

## 2013-10-08 MED ORDER — HYDROXYUREA 500 MG PO CAPS: 1500.0000 mg | ORAL_CAPSULE | Freq: Every day | ORAL | Status: DC

## 2013-10-08 MED ORDER — HYDROXYUREA 500 MG PO CAPS: 1000.0000 mg | ORAL_CAPSULE | Freq: Every day | ORAL | Status: DC

## 2013-10-08 MED ORDER — KETOROLAC TROMETHAMINE 30 MG/ML IJ SOLN
30.0000 mg | Freq: Four times a day (QID) | INTRAMUSCULAR | Status: DC
  Administered 2013-10-08 – 2013-10-09 (×5): 30 mg via INTRAVENOUS
  Filled 2013-10-08 (×9): qty 1

## 2013-10-08 MED ORDER — SODIUM CHLORIDE 0.9 % IJ SOLN
10.0000 mL | INTRAMUSCULAR | Status: DC | PRN
  Administered 2013-10-08 – 2013-10-09 (×3): 10 mL

## 2013-10-08 MED ORDER — HYDROXYUREA 500 MG PO CAPS
1000.0000 mg | ORAL_CAPSULE | Freq: Every day | ORAL | Status: DC
  Administered 2013-10-08 – 2013-10-09 (×2): 1000 mg via ORAL
  Filled 2013-10-08 (×2): qty 2

## 2013-10-08 MED ORDER — LEVOFLOXACIN 750 MG PO TABS: 750.0000 mg | ORAL_TABLET | Freq: Every day | ORAL | Status: DC

## 2013-10-08 MED ORDER — FOLIC ACID 1 MG PO TABS: 1.0000 mg | ORAL_TABLET | Freq: Every morning | ORAL | Status: DC

## 2013-10-08 NOTE — Care Management Note (Signed)
    Page 1 of 1   10/08/2013     1:21:59 PM CARE MANAGEMENT NOTE 10/08/2013  Patient:  AMORY, SIMONETTI   Account Number:  [id number]  Date Initiated:  10/08/2013  Documentation initiated by:  Dessa Phi  Subjective/Objective Assessment:   33 Y/O M ADMITTED W/[facility]-09/17/13.     Action/Plan:   FROM HOME.   Anticipated DC Date:  10/11/2013   Anticipated DC Plan:  [place]  CM consult      Choice offered to / List presented to:             Status of service:  In process, will continue to follow Medicare Important Message given?   (If response is "NO", the following Medicare IM given date fields will be blank) Date Medicare IM given:   Medicare IM given by:   Date Additional Medicare IM given:   Additional Medicare IM given by:    Discharge Disposition:    Per UR Regulation:  Reviewed for med. necessity/level of care/duration of stay  If discussed at [place] of Stay Meetings, dates discussed:    Comments:  10/08/13 [name] Kalisz RN,BSN NCM [phone] NO ANTICIPATED D/C NEEDS.

## 2013-10-08 NOTE — Progress Notes (Signed)
SICKLE CELL SERVICE PROGRESS NOTE  TEVITA GOMER DGU:[id number] DOB: 04-20-1980 DOA: 10/07/2013 PCP: Ernie Sagrero A., MD  Assessment/Plan: Active Problems:   Acute chest syndrome   Sickle cell crisis  1. Hb SS with crisis: Pt reports pain as 6/10. He has not maximized his PCA use. Encourage better use of PCA to treat pain. Continue MS contin and add Ketorolac. Pt well hydrated and eating well so will decrease IVF to [facility]. 2. Sickle Cell Disease: Pt states that he has been compliant with Hydrea but has only been taking 500 mg when he was supposed to be taking 1500 mg. Will increase to 1000 mg as this is about a 5 mg /kg increase while increasing to 1500 mg would be a 14 mg increase. If indices remain stable will increase to 1500 mg as out patient.  3. Leukocytosis: Pt has elevated WBC count without signs of bacterial pneumonia. Will discontinue antibiotics and observe without antibiotics. Encourage ICS. Wean oxygen as his saturations improve. 4. Chronic anticoagualtion: Continue Xarelto. No evidence of bleeding 5. HTN: Continue Lisinopril and metoprolol, BO well controlled.  Code Status: Full Code Family Communication: N/A Disposition Plan:  Not yet ready for discharge  [place].  Pager [phone]. If 7PM-7AM, please contact night-coverage.  10/08/2013, 10:10 AM  LOS: 1 day   Presenting HPI: Patient is 33 year old male with sickle cell disease, chronic anemia secondary to sickle cell, history of pulmonary embolism requiring throughout toe, pulmonary hypertension, presents to [facility] long emergency department with main concern of several days duration of progressively worsening generalized chest pain, all around the ribs. Patient describes it as constant, sharp, 10 out of 10 in severity, associated with malaise and fatigue, poor oral intake. Patient denies any specific alleviating factors. He also explains at home pain medications are not helpful in alleviating the pain. He also  reports associated nausea but no vomiting, denies fevers and chills. Patient denies recent sick contacts or exposures. He did notice intermittently productive cough of clear sputum.   Consultants:  None  Procedures:  None   Antibiotics:  Levaquin 9/7 >> 9/8  HPI/Subjective: Pt states that his pain is mostly located in the ribcage. He rates his pain today as 6-7/10. He denies and fevers/chills or significant cough. He states that he woke up in pain yesterday and had some tingling of lips which resolved by the time he got to the ED. He had no other neurological symptoms.   Objective: Filed Vitals:   10/08/13 0231 10/08/13 0400 10/08/13 0425 10/08/13 0500  BP: 131/81   131/86  Pulse: 90   84  Temp: 98.3 F (36.8 C)   98.3 F (36.8 C)  TempSrc: Oral   Oral  Resp: 20   20  Height:      Weight:      SpO2: 92% 95% 96% 94%   Weight change:   Intake/Output Summary (Last 24 hours) at 10/08/13 1010 Last data filed at 10/08/13 0700  Gross per 24 hour  Intake   1319 ml  Output   1275 ml  Net     44 ml    General: Alert, awake, oriented x3, in no acute distress.  HEENT: [place]/AT PEERL, EOMI, mildly icteric Neck: Trachea midline,  no masses, no thyromegal,y no JVD, no carotid bruit OROPHARYNX:  Moist, No exudate/ erythema/lesions.  Heart: Regular rate and rhythm, without murmurs, rubs, gallops. Lungs: Clear to auscultation, no wheezing or rhonchi noted. No increased vocal fremitus resonant to percussion  Abdomen: Soft, nontender, nondistended,  positive bowel sounds, no masses no hepatosplenomegaly noted..  Neuro: No focal neurological deficits noted cranial nerves II through XII grossly intact. DTRs 2+ bilaterally upper and lower extremities. Strength nrormal in bilateral upper and lower extremities.DTR's 2+ in BUE's and BLE's. Sensation intact to light touch and pin prick. Musculoskeletal: No warm swelling or erythema around joints, no spinal tenderness noted. Psychiatric: Patient  alert and oriented x3, good insight and cognition, good recent to remote recall. Lymph node survey: No cervical axillary or inguinal lymphadenopathy noted.   Data Reviewed: Basic Metabolic Panel:  Recent Labs Lab 10/05/13 0754 10/07/13 0954 10/08/13 0512  NA 136* 139 141  K 3.9 3.6* 3.9  CL 102 103 106  CO2 20 22 23   GLUCOSE 96 116* 109*  BUN 8 7 6   CREATININE 0.63 0.65 0.63  CALCIUM 9.0 9.2 8.8   Liver Function Tests:  Recent Labs Lab 10/05/13 0754 10/07/13 0954  AST 39* 31  ALT 19 17  ALKPHOS 84 82  BILITOT 6.7* 6.4*  PROT 7.4 7.5  ALBUMIN 4.0 4.1   No results found for this basename: LIPASE, AMYLASE,  in the last 168 hours No results found for this basename: AMMONIA,  in the last 168 hours CBC:  Recent Labs Lab 10/05/13 0754 10/07/13 0954 10/08/13 0512 10/08/13 0657  WBC 18.9* 18.3* 21.0*  --   NEUTROABS 13.6* 15.7* 14.5*  --   HGB 6.9* 8.1* 6.8* 6.8*  HCT 20.2* 24.2* 19.8* 20.4*  MCV 95.7 96.8 97.5  --   PLT 508* 447* 432*  --    Cardiac Enzymes:  Recent Labs Lab 10/07/13 1454  TROPONINI <0.30   BNP (last 3 results)  Recent Labs  11/13/12 1826  PROBNP 106.3   CBG: No results found for this basename: GLUCAP,  in the last 168 hours  Recent Results (from the past 240 hour(s))  MRSA PCR SCREENING     Status: Abnormal   Collection Time    10/07/13  8:03 PM      Result Value Ref Range Status   MRSA by PCR POSITIVE (*) NEGATIVE Final   Comment:            The GeneXpert MRSA Assay (FDA     approved for NASAL specimens     only), is one component of a     comprehensive MRSA colonization     surveillance program. It is not     intended to diagnose MRSA     infection nor to guide or     monitor treatment for     MRSA infections.     RESULT CALLED TO, READ BACK BY AND VERIFIED WITH:     MORRIS,M RN AT 2224 09.07.15 BY TIBBITTS,K     Studies: Dg Chest 2 View  10/07/2013   CLINICAL DATA:  Sickle-cell crisis.  EXAM: CHEST  2 VIEW   COMPARISON:  10/05/2013  FINDINGS: Left subclavian Port-A-Cath is unchanged. Lungs are somewhat hypoinflated with prominence of the perihilar markings. Mild stable cardiomegaly. Remainder the exam is unchanged.  IMPRESSION: Mild prominence of the perihilar markings which may be due to mild vascular congestion versus viral bronchopneumonia.  Stable cardiomegaly.   Electronically Signed   By: Marin Olp M.D.   On: 10/07/2013 13:49   Dg Chest 2 View  10/05/2013   CLINICAL DATA:  Sickle cell anemia crisis and back pain.  EXAM: CHEST - 2 VIEW  COMPARISON:  09/22/2013  FINDINGS: Port-A-Cath positioning is stable. Stable appearance of scattered areas  of pulmonary scarring bilaterally. There is no evidence of pulmonary edema, consolidation, pneumothorax, nodule or pleural fluid. The heart size is stable and at the upper limits of normal. The visualized spine shows no fractures or significant degenerative disease.  IMPRESSION: No active disease.  Stable pulmonary scarring.   Electronically Signed   By: Aletta Edouard M.D.   On: 10/05/2013 08:16   Dg Chest 2 View  09/22/2013   CLINICAL DATA:  Chest pain.  EXAM: CHEST  2 VIEW  COMPARISON:  August 28, 2013.  FINDINGS: Stable cardiomediastinal silhouette. Left subclavian Port-A-Cath is noted with distal tip in expected position of the SVC. No pneumothorax is noted. Stable interstitial densities are noted throughout both lungs most consistent with scarring. No acute pulmonary disease is noted. Bony thorax is intact.  IMPRESSION: No acute cardiopulmonary abnormality seen.   Electronically Signed   By: Sabino Dick M.D.   On: 09/22/2013 07:24    Scheduled Meds: . Chlorhexidine Gluconate Cloth  6 each Topical Q0600  . folic acid  1 mg Oral q morning - 10a  . HYDROmorphone PCA 0.3 mg/mL   Intravenous 6 times per day  . [START ON 10/09/2013] hydroxyurea  1,500 mg Oral Daily  . levofloxacin (LEVAQUIN) IV  500 mg Intravenous Q24H  . lisinopril  10 mg Oral Daily  .  metoprolol tartrate  25 mg Oral Daily  . morphine  30 mg Oral Q12H  . mupirocin ointment  1 application Nasal BID  . potassium chloride SA  20 mEq Oral q morning - 10a  . rivaroxaban  20 mg Oral Q supper   Continuous Infusions: . sodium chloride 100 mL/hr at 10/08/13 0430    Time spent 35 minutes.

## 2013-10-08 NOTE — Plan of Care (Signed)
Night call - paged 5.45 am Hb 6.8 , pt in sickel cell crisis, baseline Hb around 6.8, type screen, repeat H&H in a few hrs.

## 2013-10-08 NOTE — Progress Notes (Signed)
CRITICAL VALUE ALERT  Critical value received:  Hgb 6.8  Date of notification:  10/08/13  Time of notification:  3154  Critical value read back:Yes.    Nurse who received alert:  M. Tunis Gentle RN  MD notified (1st page):  P. Candiss Norse  Time of first page:  [phone]  MD notified (2nd page):  Time of second page:  Responding MD:  Ronnie Derby  Time MD responded:  [phone]

## 2013-10-09 ENCOUNTER — Other Ambulatory Visit: Payer: Self-pay | Admitting: Internal Medicine

## 2013-10-09 DIAGNOSIS — D589 Hereditary hemolytic anemia, unspecified: Secondary | ICD-10-CM

## 2013-10-09 DIAGNOSIS — T50905D Adverse effect of unspecified drugs, medicaments and biological substances, subsequent encounter: Secondary | ICD-10-CM

## 2013-10-09 LAB — CBC WITH DIFFERENTIAL/PLATELET
BASOS PCT: 0 % (ref 0–1)
Band Neutrophils: 0 % (ref 0–10)
Basophils Absolute: 0 10*3/uL (ref 0.0–0.1)
Blasts: 0 %
EOS ABS: 0.4 10*3/uL (ref 0.0–0.7)
EOS PCT: 2 % (ref 0–5)
HCT: 18.7 % — ABNORMAL LOW (ref 39.0–52.0)
HEMOGLOBIN: 6.6 g/dL — AB (ref 13.0–17.0)
LYMPHS ABS: 3.9 10*3/uL (ref 0.7–4.0)
Lymphocytes Relative: 19 % (ref 12–46)
MCH: 33.5 pg (ref 26.0–34.0)
MCHC: 35.3 g/dL (ref 30.0–36.0)
MCV: 94.9 fL (ref 78.0–100.0)
MONO ABS: 2.3 10*3/uL — AB (ref 0.1–1.0)
Metamyelocytes Relative: 0 %
Monocytes Relative: 11 % (ref 3–12)
Myelocytes: 0 %
NEUTROS ABS: 14 10*3/uL — AB (ref 1.7–7.7)
NEUTROS PCT: 68 % (ref 43–77)
Platelets: 412 10*3/uL — ABNORMAL HIGH (ref 150–400)
Promyelocytes Absolute: 0 %
RBC: 1.97 MIL/uL — AB (ref 4.22–5.81)
RDW: 20.8 % — ABNORMAL HIGH (ref 11.5–15.5)
WBC: 20.6 10*3/uL — ABNORMAL HIGH (ref 4.0–10.5)
nRBC: 3 /100 WBC — ABNORMAL HIGH

## 2013-10-09 LAB — LACTATE DEHYDROGENASE: LDH: 434 U/L — ABNORMAL HIGH (ref 94–250)

## 2013-10-09 MED ORDER — HEPARIN SOD (PORK) LOCK FLUSH 100 UNIT/ML IV SOLN
500.0000 [IU] | INTRAVENOUS | Status: AC | PRN
  Administered 2013-10-09: 500 [IU]

## 2013-10-09 MED ORDER — HYDROMORPHONE HCL 4 MG PO TABS
4.0000 mg | ORAL_TABLET | ORAL | Status: DC
  Administered 2013-10-09 (×2): 4 mg via ORAL
  Filled 2013-10-09 (×2): qty 1

## 2013-10-09 NOTE — Progress Notes (Signed)
Patient discharge instructions and medications reviewed with patient. Patient verbalizes understanding. Patient confirms he has all personal belongings.

## 2013-10-09 NOTE — Progress Notes (Signed)
24 hour PCA: 4.5mg                        16 demands                         15 delivered

## 2013-10-09 NOTE — Discharge Summary (Signed)
[name] MRN: [id number] DOB/AGE: [birth year] 33 y.o.  Admit date: 10/07/2013 Discharge date: 10/09/2013  Primary Care Physician:  Sabin Gibeault A., MD   Discharge Diagnoses:   Patient Active Problem List   Diagnosis Date Noted  . NSTEMI (non-ST elevated myocardial infarction) 06/18/2013    Priority: High  . Acute chest syndrome 06/18/2013    Priority: High  . Sickle cell crisis 06/14/2013    Priority: Medium  . Avascular necrosis     Priority: Medium  . Anemia 09/22/2013  . Acute sickle cell crisis 08/26/2013  . Chronic anticoagulation 08/22/2013  . HTN (hypertension) 08/22/2013  . Sickle cell anemia with pain 07/10/2013  . Thrombocytosis 07/10/2013  . Thrombocytosis 07/01/2013  . Pulmonary HTN 06/18/2013  . Bilateral flank pain 06/17/2013  . Sickle cell anemia 06/17/2013  . Hypoxia 06/17/2013  . Hypoxemia 06/17/2013  . Acute respiratory failure with hypoxia 06/02/2013  . HCAP (healthcare-associated pneumonia) 06/02/2013  . Diastolic congestive heart failure, NYHA class 1 05/19/2013  . Sickle-cell crisis with associated acute chest syndrome 05/13/2013  . Functional asplenia   . Alcohol consumption of one to four drinks per day   . Hypokalemia 04/22/2013  . Unspecified vitamin D deficiency 02/13/2013  . Uses marijuana 01/13/2013  . Leukocytosis, unspecified 08/30/2012  . 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 Hx of pulmonary embolus 06/29/2012    DISCHARGE MEDICATION:   Medication List         folic acid 1 MG tablet  Commonly known as:  FOLVITE  Take 1 tablet (1 mg total) by mouth every morning.     HYDROmorphone 4 MG tablet  Commonly known as:  DILAUDID  Take 1 tablet (4 mg total) by mouth every 4 (four) hours as needed for severe pain.     hydroxyurea 500 MG capsule  Commonly known as:  HYDREA  Take 2 capsules (1,000 mg total) by mouth daily. May take with food to minimize GI side effects.     lisinopril 10 MG tablet  Commonly known as:   PRINIVIL,ZESTRIL  Take 1 tablet (10 mg total) by mouth daily.     metoprolol tartrate 25 MG tablet  Commonly known as:  LOPRESSOR  Take 1 tablet (25 mg total) by mouth daily.     morphine 30 MG 12 hr tablet  Commonly known as:  MS CONTIN  Take 1 tablet (30 mg total) by mouth every 12 (twelve) hours.     potassium chloride SA 20 MEQ tablet  Commonly known as:  K-DUR,KLOR-CON  Take 1 tablet (20 mEq total) by mouth every morning.     rivaroxaban 20 MG Tabs tablet  Commonly known as:  XARELTO  Take 1 tablet (20 mg total) by mouth every morning.     zolpidem 10 MG tablet  Commonly known as:  AMBIEN  Take 1 tablet (10 mg total) by mouth at bedtime as needed for sleep.          Consults: Treatment Team:  [name], MD   SIGNIFICANT DIAGNOSTIC STUDIES:  Dg Chest 2 View  10/07/2013   CLINICAL DATA:  Sickle-cell crisis.  EXAM: CHEST  2 VIEW  COMPARISON:  10/05/2013  FINDINGS: Left subclavian Port-A-Cath is unchanged. Lungs are somewhat hypoinflated with prominence of the perihilar markings. Mild stable cardiomegaly. Remainder the exam is unchanged.  IMPRESSION: Mild prominence of the perihilar markings which may be due to mild vascular congestion versus viral bronchopneumonia.  Stable cardiomegaly.   Electronically Signed   By: Quillian Quince  Derrel Nip M.D.   On: 10/07/2013 13:49   Dg Chest 2 View  10/05/2013   CLINICAL DATA:  Sickle cell anemia crisis and back pain.  EXAM: CHEST - 2 VIEW  COMPARISON:  09/22/2013  FINDINGS: Port-A-Cath positioning is stable. Stable appearance of scattered areas of pulmonary scarring bilaterally. There is no evidence of pulmonary edema, consolidation, pneumothorax, nodule or pleural fluid. The heart size is stable and at the upper limits of normal. The visualized spine shows no fractures or significant degenerative disease.  IMPRESSION: No active disease.  Stable pulmonary scarring.   Electronically Signed   By: Aletta Edouard M.D.   On: 10/05/2013 08:16    Dg Chest 2 View  09/22/2013   CLINICAL DATA:  Chest pain.  EXAM: CHEST  2 VIEW  COMPARISON:  August 28, 2013.  FINDINGS: Stable cardiomediastinal silhouette. Left subclavian Port-A-Cath is noted with distal tip in expected position of the SVC. No pneumothorax is noted. Stable interstitial densities are noted throughout both lungs most consistent with scarring. No acute pulmonary disease is noted. Bony thorax is intact.  IMPRESSION: No acute cardiopulmonary abnormality seen.   Electronically Signed   By: Sabino Dick M.D.   On: 09/22/2013 07:24       Recent Results (from the past 240 hour(s))  MRSA PCR SCREENING     Status: Abnormal   Collection Time    10/07/13  8:03 PM      Result Value Ref Range Status   MRSA by PCR POSITIVE (*) NEGATIVE Final   Comment:            The GeneXpert MRSA Assay (FDA     approved for NASAL specimens     only), is one component of a     comprehensive MRSA colonization     surveillance program. It is not     intended to diagnose MRSA     infection nor to guide or     monitor treatment for     MRSA infections.     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 Patient describes it as constant, sharp, 10 out of 10 in severity, associated with malaise and fatigue, poor oral intake. Patient denies any specific alleviating factors. He also explains at home pain medications are not helpful in alleviating the pain. He also reports associated nausea but no vomiting, denies fevers and chills. Patient denies recent sick contacts or exposures. He did notice  intermittently productive cough of clear sputum.    Hospital Course:  Present on Admission:   Hb SS with Sickle Cell Crisis: Pt was treated with weight based  IV dilaudid via PCA, Toradol and IVF. He was transitioned to oral medications and discharged in stable condition. He was initially assessed to have Acute Cehst Syndrome. However his absence of new infiltrate on CXR did not meet criteria for Acute Chest Syndrome and antibiotics were discontinued and he did not require any transfusion. Hb was 6.6 at time of discharge  Leukocytosis: Pt has a chronic leukocytosis and had no signs of infection.   Disposition and Follow-up:  Pt discharged in stable condition.  He is to follow up in the clinic as scheduled.      Discharge Instructions   Activity as tolerated - No restrictions    Complete by:  As directed      Diet - low sodium heart healthy    Complete by:  As directed            DISCHARGE EXAM:  General: Alert, awake, oriented x3, in no acute distress.  Vital Signs: BP 125/82, HR 60, T 98.4 F (36.9 C), temperature source Oral, RR 20, height 6' (1.829 m), weight 163 lb 6.4 oz (74.118 kg), SpO2 96.00%. HEENT: [place]/AT PEERL, EOMI, mildly icteric  Neck: Trachea midline, no masses, no thyromegal,y no JVD, no carotid bruit  OROPHARYNX: Moist, No exudate/ erythema/lesions.  Heart: Regular rate and rhythm, without murmurs, rubs, gallops.  Lungs: Clear to auscultation, no wheezing or rhonchi noted. No increased vocal fremitus resonant to percussion  Abdomen: Soft, nontender, nondistended, positive bowel sounds, no masses no hepatosplenomegaly noted..  Neuro: No focal neurological deficits noted cranial nerves II through XII grossly intact. DTRs 2+ bilaterally upper and lower extremities. Strength nrormal in bilateral upper and lower extremities.DTR's 2+ in BUE's and BLE's. Sensation intact to light touch and pin prick.  Musculoskeletal: No warm swelling or erythema around joints, no spinal  tenderness noted.  Psychiatric: Patient alert and oriented x3, good insight and cognition, good recent to remote recall.  Lymph node survey: No cervical axillary or inguinal lymphadenopathy noted.    Recent Labs  10/07/13 0954 10/08/13 0512  NA 139 141  K 3.6* 3.9  CL 103 106  CO2 22 23  GLUCOSE 116* 109*  BUN 7 6  CREATININE 0.65 0.63  CALCIUM 9.2 8.8    Recent Labs  10/07/13 0954  AST 31  ALT 17  ALKPHOS 82  BILITOT 6.4*  PROT 7.5  ALBUMIN 4.1   No results found for this basename: LIPASE, AMYLASE,  in the last 72 hours  Recent Labs  10/08/13 0512 10/08/13 0657 10/09/13 0450  WBC 21.0*  --  20.6*  NEUTROABS 14.5*  --  14.0*  HGB 6.8* 6.8* 6.6*  HCT 19.8* 20.4* 18.7*  MCV 97.5  --  94.9  PLT 432*  --  412*    Signed: Dywane Peruski A. 10/09/2013, 4:08 PM

## 2013-10-12 LAB — TYPE AND SCREEN
ABO/RH(D): O POS
ANTIBODY SCREEN: NEGATIVE
Unit division: 0
Unit division: 0

## 2013-10-17 ENCOUNTER — Encounter (HOSPITAL_COMMUNITY): Payer: Self-pay

## 2013-10-17 ENCOUNTER — Telehealth (HOSPITAL_COMMUNITY): Payer: Self-pay | Admitting: Internal Medicine

## 2013-10-17 ENCOUNTER — Non-Acute Institutional Stay (HOSPITAL_COMMUNITY)
Admission: AD | Admit: 2013-10-17 | Discharge: 2013-10-17 | Disposition: A | Discharge status: Home / Self Care | Payer: Medicare Other | Attending: Internal Medicine | Admitting: Internal Medicine

## 2013-10-17 DIAGNOSIS — Z7901 Long term (current) use of anticoagulants: Secondary | ICD-10-CM | POA: Diagnosis not present

## 2013-10-17 DIAGNOSIS — Z96659 Presence of unspecified artificial knee joint: Secondary | ICD-10-CM | POA: Diagnosis not present

## 2013-10-17 DIAGNOSIS — Z87891 Personal history of nicotine dependence: Secondary | ICD-10-CM | POA: Diagnosis not present

## 2013-10-17 DIAGNOSIS — F1210 Cannabis abuse, uncomplicated: Secondary | ICD-10-CM | POA: Insufficient documentation

## 2013-10-17 DIAGNOSIS — Z9089 Acquired absence of other organs: Secondary | ICD-10-CM | POA: Diagnosis not present

## 2013-10-17 DIAGNOSIS — Z86711 Personal history of pulmonary embolism: Secondary | ICD-10-CM | POA: Insufficient documentation

## 2013-10-17 DIAGNOSIS — Q8909 Congenital malformations of spleen: Secondary | ICD-10-CM | POA: Insufficient documentation

## 2013-10-17 DIAGNOSIS — D57819 Other sickle-cell disorders with crisis, unspecified: Secondary | ICD-10-CM | POA: Insufficient documentation

## 2013-10-17 DIAGNOSIS — E876 Hypokalemia: Secondary | ICD-10-CM | POA: Diagnosis not present

## 2013-10-17 DIAGNOSIS — M8700 Idiopathic aseptic necrosis of unspecified bone: Secondary | ICD-10-CM | POA: Diagnosis not present

## 2013-10-17 DIAGNOSIS — F39 Unspecified mood [affective] disorder: Secondary | ICD-10-CM | POA: Insufficient documentation

## 2013-10-17 DIAGNOSIS — I10 Essential (primary) hypertension: Secondary | ICD-10-CM | POA: Insufficient documentation

## 2013-10-17 DIAGNOSIS — E860 Dehydration: Secondary | ICD-10-CM | POA: Insufficient documentation

## 2013-10-17 DIAGNOSIS — D5700 Hb-SS disease with crisis, unspecified: Secondary | ICD-10-CM | POA: Diagnosis present

## 2013-10-17 LAB — RETICULOCYTES
RBC.: 2.21 MIL/uL — ABNORMAL LOW (ref 4.22–5.81)
Retic Count, Absolute: 291.7 10*3/uL — ABNORMAL HIGH (ref 19.0–186.0)
Retic Ct Pct: 13.2 % — ABNORMAL HIGH (ref 0.4–3.1)

## 2013-10-17 MED ORDER — ONDANSETRON HCL 4 MG PO TABS: 4.0000 mg | ORAL_TABLET | ORAL | Status: DC | PRN

## 2013-10-17 MED ORDER — NALOXONE HCL 0.4 MG/ML IJ SOLN: 0.4000 mg | INTRAMUSCULAR | Status: DC | PRN

## 2013-10-17 MED ORDER — DEXTROSE-NACL 5-0.45 % IV SOLN
INTRAVENOUS | Status: DC
  Administered 2013-10-17: 11:00:00 via INTRAVENOUS

## 2013-10-17 MED ORDER — HYDROMORPHONE 0.3 MG/ML IV SOLN
INTRAVENOUS | Status: DC
  Administered 2013-10-17: 10:00:00 via INTRAVENOUS
  Administered 2013-10-17: 7.09 mg via INTRAVENOUS
  Filled 2013-10-17: qty 25

## 2013-10-17 MED ORDER — FOLIC ACID 1 MG PO TABS
1.0000 mg | ORAL_TABLET | Freq: Every day | ORAL | Status: DC
  Administered 2013-10-17: 1 mg via ORAL
  Filled 2013-10-17: qty 1

## 2013-10-17 MED ORDER — SODIUM CHLORIDE 0.9 % IV SOLN
25.0000 mg | INTRAVENOUS | Status: DC | PRN
  Filled 2013-10-17: qty 0.5

## 2013-10-17 MED ORDER — SODIUM CHLORIDE 0.9 % IJ SOLN
10.0000 mL | INTRAMUSCULAR | Status: AC | PRN
  Administered 2013-10-17: 10 mL

## 2013-10-17 MED ORDER — HEPARIN SOD (PORK) LOCK FLUSH 100 UNIT/ML IV SOLN
500.0000 [IU] | INTRAVENOUS | Status: AC | PRN
  Administered 2013-10-17: 500 [IU]
  Filled 2013-10-17: qty 5

## 2013-10-17 MED ORDER — DIPHENHYDRAMINE HCL 25 MG PO CAPS
25.0000 mg | ORAL_CAPSULE | ORAL | Status: DC | PRN
  Administered 2013-10-17: 25 mg via ORAL
  Filled 2013-10-17: qty 1

## 2013-10-17 MED ORDER — ONDANSETRON HCL 4 MG/2ML IJ SOLN: 4.0000 mg | INTRAMUSCULAR | Status: DC | PRN

## 2013-10-17 MED ORDER — HYDROMORPHONE HCL 4 MG PO TABS
4.0000 mg | ORAL_TABLET | Freq: Once | ORAL | Status: AC
  Administered 2013-10-17: 4 mg via ORAL
  Filled 2013-10-17: qty 1

## 2013-10-17 MED ORDER — KETOROLAC TROMETHAMINE 30 MG/ML IJ SOLN
15.0000 mg | Freq: Four times a day (QID) | INTRAMUSCULAR | Status: DC
  Administered 2013-10-17: 15 mg via INTRAVENOUS
  Filled 2013-10-17 (×5): qty 1

## 2013-10-17 MED ORDER — SODIUM CHLORIDE 0.9 % IJ SOLN: 9.0000 mL | INTRAMUSCULAR | Status: DC | PRN

## 2013-10-17 NOTE — Discharge Summary (Signed)
Physician Discharge Summary  [name] QQI:[id number] DOB: [birth year] DOA: 10/17/2013  PCP: [name],[name], MD  Admit date: 10/17/2013 Discharge date: 10/17/2013  Discharge Diagnoses:  Active Problems:   Sickle cell anemia with crisis   Discharge Condition: Stable  Disposition:  Home  Diet: Heart Healthy Wt Readings from Last 3 Encounters:  10/17/13 163 lb (73.936 kg)  10/07/13 163 lb 6.4 oz (74.118 kg)  09/22/13 164 lb 11.2 oz (74.707 kg)     Hospital Course:  [name] presented to day infusion center with 8/10 pain to flanks. He states that pain crisis started approximately 1 day ago. He attributes crisis to increased stress and weather change.  Started hypotonic IVFs for cellular re-hydration. Toradol 30 mg times one for inflammation. [name] was started on high dose PCA dilaudid per weight based protocol and given a loading dose of 1.5 mg. Also, he used a total of 7.09 mg with 10 demands and 7 deliveries. He was given Dilaudid 4 mg po 30 minutes prior to discharge. He states that his current pain intensity is 4/10, he is alert, oriented, and ambulatory. He maintains that he can function at home on current medication regimen and will follow up with Dr. Zigmund Daniel as scheduled. Will discharge home with brother.   Plan outlined and reviewed with Dr. Jonelle Sidle  Discharge Exam:  Filed Vitals:   10/17/13 1137  BP: 128/91  Pulse: 83  Temp:   Resp: 20   Filed Vitals:   10/17/13 0918 10/17/13 1028 10/17/13 1037 10/17/13 1137  BP: 121/72  122/64 128/91  Pulse: 79  77 83  Temp: 98.3 F (36.8 C)     TempSrc: Oral     Resp: 18 18 21 20   Weight: 163 lb (73.936 kg)     SpO2: 94%  93% 93%    General: Alert, awake, oriented x3, in no acute distress.  HEENT: [place]/AT PEERL, EOMI Neck: Trachea midline,  no masses, no thyromegal,y no JVD, no carotid bruit OROPHARYNX:  Moist, No exudate/ erythema/lesions.  Heart: Irregular heart rate and rhythm, without murmurs, rubs,  gallops, PMI non-displaced, no heaves or thrills on palpation.  Lungs: Clear to auscultation, no wheezing or rhonchi noted. No increased vocal fremitus resonant to percussion  Abdomen: Soft, nontender, nondistended, positive bowel sounds, no masses no hepatosplenomegaly noted..  Neuro: No focal neurological deficits noted cranial nerves II through XII grossly intact. DTRs 2+ bilaterally upper and lower extremities. Strength 5 out of 5 in bilateral upper and lower extremities. Musculoskeletal: No warm swelling or erythema around joints, no spinal tenderness noted. Psychiatric: Patient alert and oriented x3, good insight and cognition, good recent to remote recall. Lymph node survey: No cervical axillary or inguinal lymphadenopathy noted.   Discharge Instructions     Medication List    ASK your doctor about these medications       folic acid 1 MG tablet  Commonly known as:  FOLVITE  Take 1 tablet (1 mg total) by mouth every morning.     HYDROmorphone 4 MG tablet  Commonly known as:  DILAUDID  Take 1 tablet (4 mg total) by mouth every 4 (four) hours as needed for severe pain.     hydroxyurea 500 MG capsule  Commonly known as:  HYDREA  Take 2 capsules (1,000 mg total) by mouth daily. May take with food to minimize GI side effects.     lisinopril 10 MG tablet  Commonly known as:  PRINIVIL,ZESTRIL  Take 1 tablet (10 mg total) by  mouth daily.     metoprolol tartrate 25 MG tablet  Commonly known as:  LOPRESSOR  Take 1 tablet (25 mg total) by mouth daily.     morphine 30 MG 12 hr tablet  Commonly known as:  MS CONTIN  Take 1 tablet (30 mg total) by mouth every 12 (twelve) hours.     potassium chloride SA 20 MEQ tablet  Commonly known as:  K-DUR,KLOR-CON  Take 1 tablet (20 mEq total) by mouth every morning.     rivaroxaban 20 MG Tabs tablet  Commonly known as:  XARELTO  Take 1 tablet (20 mg total) by mouth every morning.     zolpidem 10 MG tablet  Commonly known as:  AMBIEN   Take 1 tablet (10 mg total) by mouth at bedtime as needed for sleep.          The results of significant diagnostics from this hospitalization (including imaging, microbiology, ancillary and laboratory) are listed below for reference.    Significant Diagnostic Studies: Dg Chest 2 View  10/07/2013   CLINICAL DATA:  Sickle-cell crisis.  EXAM: CHEST  2 VIEW  COMPARISON:  10/05/2013  FINDINGS: Left subclavian Port-A-Cath is unchanged. Lungs are somewhat hypoinflated with prominence of the perihilar markings. Mild stable cardiomegaly. Remainder the exam is unchanged.  IMPRESSION: Mild prominence of the perihilar markings which may be due to mild vascular congestion versus viral bronchopneumonia.  Stable cardiomegaly.   Electronically Signed   By: Marin Olp M.D.   On: 10/07/2013 13:49   Dg Chest 2 View  10/05/2013   CLINICAL DATA:  Sickle cell anemia crisis and back pain.  EXAM: CHEST - 2 VIEW  COMPARISON:  09/22/2013  FINDINGS: Port-A-Cath positioning is stable. Stable appearance of scattered areas of pulmonary scarring bilaterally. There is no evidence of pulmonary edema, consolidation, pneumothorax, nodule or pleural fluid. The heart size is stable and at the upper limits of normal. The visualized spine shows no fractures or significant degenerative disease.  IMPRESSION: No active disease.  Stable pulmonary scarring.   Electronically Signed   By: Aletta Edouard M.D.   On: 10/05/2013 08:16   Dg Chest 2 View  09/22/2013   CLINICAL DATA:  Chest pain.  EXAM: CHEST  2 VIEW  COMPARISON:  August 28, 2013.  FINDINGS: Stable cardiomediastinal silhouette. Left subclavian Port-A-Cath is noted with distal tip in expected position of the SVC. No pneumothorax is noted. Stable interstitial densities are noted throughout both lungs most consistent with scarring. No acute pulmonary disease is noted. Bony thorax is intact.  IMPRESSION: No acute cardiopulmonary abnormality seen.   Electronically Signed   By: Sabino Dick M.D.   On: 09/22/2013 07:24    Microbiology: Recent Results (from the past 240 hour(s))  MRSA PCR SCREENING     Status: Abnormal   Collection Time    10/07/13  8:03 PM      Result Value Ref Range Status   MRSA by PCR POSITIVE (*) NEGATIVE Final   Comment:            The GeneXpert MRSA Assay (FDA     approved for NASAL specimens     only), is one component of a     comprehensive MRSA colonization     surveillance program. It is not     intended to diagnose MRSA     infection nor to guide or     monitor treatment for     MRSA infections.     RESULT  CALLED TO, READ BACK BY AND VERIFIED WITH:     MORRIS,M RN AT 2224 09.07.15 BY TIBBITTS,K     Labs: Basic Metabolic Panel: No results found for this basename: NA, K, CL, CO2, GLUCOSE, BUN, CREATININE, CALCIUM, MG, PHOS,  in the last 168 hours Liver Function Tests: No results found for this basename: AST, ALT, ALKPHOS, BILITOT, PROT, ALBUMIN,  in the last 168 hours No results found for this basename: LIPASE, AMYLASE,  in the last 168 hours No results found for this basename: AMMONIA,  in the last 168 hours CBC: No results found for this basename: WBC, NEUTROABS, HGB, HCT, MCV, PLT,  in the last 168 hours Cardiac Enzymes: No results found for this basename: CKTOTAL, CKMB, CKMBINDEX, TROPONINI,  in the last 168 hours BNP: No components found with this basename: POCBNP,  CBG: No results found for this basename: GLUCAP,  in the last 168 hours Ferritin: No results found for this basename: FERRITIN,  in the last 168 hours  Time coordinating discharge: Greater than 30 minutes  Signed:  Aryaan Persichetti M  10/17/2013, 12:44 PM

## 2013-10-17 NOTE — Telephone Encounter (Signed)
Pt notified that, per MD, he may come to day hospital for evaluation; pt verbalizes understanding

## 2013-10-17 NOTE — Progress Notes (Signed)
Pt discharged to home; port deaccessed with no complications noted; discharge instructions explained, given, and signed; all questions answered; pt alert and oriented, ambulatory, ride here to accompany patient home

## 2013-10-17 NOTE — Telephone Encounter (Signed)
Pt called and reports experiencing pain in legs and ribs; pt denies SOB, CP, n/v/d, or priapism; states pain is 8/10 with no relief from home medications; MD notified

## 2013-10-17 NOTE — H&P (Signed)
[name] is an 33 y.o. male.   Chief Complaint: Pain in legs and back  HPI: Patient is here with pain in his legs and thighs for 1 day. It started this morning when the pain woke him up from sleep. He rates it at 8/10, throbbing, not radiating, not relieved by his other medications and aggravated by any movement. No fever, no SOB, no cough and no NVD. He believes it feels like his usual sickle cell pain.   Past Medical History  Diagnosis Date  . Sickle cell anemia   . Blood transfusion   . 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 Hypokalemia   . Mood disorder   . History of pulmonary embolus (PE)   . Avascular necrosis   . Leukocytosis     Chronic  . Thrombocytosis     Chronic  . Hypertension   . History of Clostridium difficile infection   . Uses marijuana   . Chronic anticoagulation   . Functional asplenia   . Former smoker   . Second hand tobacco smoke exposure   . Alcohol consumption of one to four drinks per day     Past Surgical History  Procedure Laterality Date  . Right hip replacement      08/2006  . Cholecystectomy      01/2008  . Porta cath placement    . Porta cath removal    . Umbilical hernia repair      01/2008  . Excision of left periauricular cyst      10/2009  . Excision of right ear lobe cyst with primary closur      11/2007  . Portacath placement  01/05/2012    Procedure: INSERTION PORT-A-CATH;  Surgeon: Odis Hollingshead, MD;  Location: [facility];  Service: General;  Laterality: N/A;  ultrasound guiced port a cath insertion with fluoroscopy    Family History  Problem Relation Age of Onset  . Sickle cell anemia Mother   . Sickle cell anemia Father   . Sickle cell trait Brother    Social History:  reports that he quit smoking about 3 years ago. He has never used smokeless tobacco. He reports that he uses illicit drugs about twice per week. He reports that he does not drink alcohol.  Allergies:  Allergies  Allergen  Reactions  . Morphine And Related Hives and Rash    Pt states he can take PO, but not IV and he is able to tolerate Dilaudid with no reactions.    Medications Prior to Admission  Medication Sig Dispense Refill  . folic acid (FOLVITE) 1 MG tablet Take 1 tablet (1 mg total) by mouth every morning.  30 tablet  11  . HYDROmorphone (DILAUDID) 4 MG tablet Take 1 tablet (4 mg total) by mouth every 4 (four) hours as needed for severe pain.  90 tablet  0  . hydroxyurea (HYDREA) 500 MG capsule Take 2 capsules (1,000 mg total) by mouth daily. May take with food to minimize GI side effects.  60 capsule  3  . lisinopril (PRINIVIL,ZESTRIL) 10 MG tablet Take 1 tablet (10 mg total) by mouth daily.  30 tablet  0  . metoprolol tartrate (LOPRESSOR) 25 MG tablet Take 1 tablet (25 mg total) by mouth daily.  30 tablet  6  . morphine (MS CONTIN) 30 MG 12 hr tablet Take 1 tablet (30 mg total) by mouth every 12 (twelve) hours.  60 tablet  0  .  potassium chloride SA (K-DUR,KLOR-CON) 20 MEQ tablet Take 1 tablet (20 mEq total) by mouth every morning.  30 tablet  3  . rivaroxaban (XARELTO) 20 MG TABS tablet Take 1 tablet (20 mg total) by mouth every morning.  30 tablet  0  . zolpidem (AMBIEN) 10 MG tablet Take 1 tablet (10 mg total) by mouth at bedtime as needed for sleep.  30 tablet  0    No results found for this or any previous visit (from the past 48 hour(s)). No results found.  Review of Systems  Constitutional: Negative.   HENT: Negative.   Eyes: Negative.   Respiratory: Negative.   Cardiovascular: Negative.   Gastrointestinal: Negative.   Genitourinary: Negative.   Musculoskeletal: Positive for back pain, joint pain and myalgias.  Skin: Negative.   Neurological: Negative.   Endo/Heme/Allergies: Negative.   Psychiatric/Behavioral: Negative.     Blood pressure 121/72, pulse 79, temperature 98.3 F (36.8 C), temperature source Oral, resp. rate 18, weight 73.936 kg (163 lb), SpO2 94.00%. Physical Exam   Constitutional: He is oriented to person, place, and time. He appears well-developed and well-nourished.  HENT:  Head: Normocephalic and atraumatic.  Eyes: Conjunctivae and EOM are normal. Pupils are equal, round, and reactive to light.  Neck: Normal range of motion. Neck supple.  Cardiovascular: Normal rate, regular rhythm, normal heart sounds and intact distal pulses.   Respiratory: Effort normal and breath sounds normal.  GI: Soft. Bowel sounds are normal.  Musculoskeletal: Normal range of motion. He exhibits tenderness.  Neurological: He is alert and oriented to person, place, and time. He has normal reflexes.  Skin: Skin is warm and dry.  Psychiatric: He has a normal mood and affect.     Assessment/Plan A 33 yo here with sickle cell painful crisis  #1 Sickle Cell Painful crisis: Patient will be admitted to the sickle cell day hospital. Will get Hydrated, IV Dilaudid via PCA pump, Toradol as well as his home medications. We will aim to reduce his pain level to tolerable level at the end of his stay.  #2 Sickle Cell Anemia: H/H will be checked and the need for transfusion will be determined if any.  #3 Dehydration: Will hydrate cautiously  # 4 Chronic anticoagulation: Continue Xarelto from home.  Ariyannah Pauling,[name] 10/17/2013, 10:08 AM

## 2013-10-19 NOTE — Discharge Summary (Signed)
Patient was admitted to sickle cell the hospital. He was evaluated and treated with IV Dilaudid hydration as well as Toradol. His pain went from 10 out of 10-4/10 and was discharged home in good health.

## 2013-10-23 ENCOUNTER — Telehealth: Payer: Self-pay | Admitting: Internal Medicine

## 2013-10-23 DIAGNOSIS — G4700 Insomnia, unspecified: Secondary | ICD-10-CM

## 2013-10-24 ENCOUNTER — Other Ambulatory Visit: Payer: Self-pay | Admitting: Internal Medicine

## 2013-10-24 ENCOUNTER — Encounter (HOSPITAL_COMMUNITY): Payer: Self-pay

## 2013-10-24 ENCOUNTER — Telehealth (HOSPITAL_COMMUNITY): Payer: Self-pay | Admitting: *Deleted

## 2013-10-24 ENCOUNTER — Other Ambulatory Visit: Payer: Self-pay

## 2013-10-24 ENCOUNTER — Telehealth (HOSPITAL_COMMUNITY): Payer: Self-pay | Admitting: Internal Medicine

## 2013-10-24 ENCOUNTER — Non-Acute Institutional Stay (HOSPITAL_BASED_OUTPATIENT_CLINIC_OR_DEPARTMENT_OTHER)
Admission: AD | Admit: 2013-10-24 | Discharge: 2013-10-24 | Disposition: A | Discharge status: Home / Self Care | Payer: Medicare Other | Source: Home / Self Care | Attending: Internal Medicine | Admitting: Internal Medicine

## 2013-10-24 ENCOUNTER — Telehealth: Payer: Self-pay | Admitting: Internal Medicine

## 2013-10-24 DIAGNOSIS — S79929A Unspecified injury of unspecified thigh, initial encounter: Secondary | ICD-10-CM | POA: Diagnosis not present

## 2013-10-24 DIAGNOSIS — D72829 Elevated white blood cell count, unspecified: Secondary | ICD-10-CM | POA: Diagnosis not present

## 2013-10-24 DIAGNOSIS — M25559 Pain in unspecified hip: Secondary | ICD-10-CM | POA: Diagnosis present

## 2013-10-24 DIAGNOSIS — Z79899 Other long term (current) drug therapy: Secondary | ICD-10-CM | POA: Insufficient documentation

## 2013-10-24 DIAGNOSIS — I517 Cardiomegaly: Secondary | ICD-10-CM | POA: Insufficient documentation

## 2013-10-24 DIAGNOSIS — R079 Chest pain, unspecified: Secondary | ICD-10-CM | POA: Diagnosis not present

## 2013-10-24 DIAGNOSIS — M8700 Idiopathic aseptic necrosis of unspecified bone: Secondary | ICD-10-CM | POA: Diagnosis not present

## 2013-10-24 DIAGNOSIS — Z86711 Personal history of pulmonary embolism: Secondary | ICD-10-CM

## 2013-10-24 DIAGNOSIS — Z7901 Long term (current) use of anticoagulants: Secondary | ICD-10-CM

## 2013-10-24 DIAGNOSIS — D5700 Hb-SS disease with crisis, unspecified: Secondary | ICD-10-CM

## 2013-10-24 DIAGNOSIS — I10 Essential (primary) hypertension: Secondary | ICD-10-CM | POA: Diagnosis not present

## 2013-10-24 DIAGNOSIS — S79919A Unspecified injury of unspecified hip, initial encounter: Secondary | ICD-10-CM | POA: Diagnosis not present

## 2013-10-24 DIAGNOSIS — F1210 Cannabis abuse, uncomplicated: Secondary | ICD-10-CM

## 2013-10-24 DIAGNOSIS — D571 Sickle-cell disease without crisis: Secondary | ICD-10-CM | POA: Diagnosis not present

## 2013-10-24 DIAGNOSIS — Q8909 Congenital malformations of spleen: Secondary | ICD-10-CM | POA: Insufficient documentation

## 2013-10-24 DIAGNOSIS — Z87891 Personal history of nicotine dependence: Secondary | ICD-10-CM | POA: Insufficient documentation

## 2013-10-24 DIAGNOSIS — S298XXA Other specified injuries of thorax, initial encounter: Secondary | ICD-10-CM | POA: Diagnosis not present

## 2013-10-24 DIAGNOSIS — R109 Unspecified abdominal pain: Secondary | ICD-10-CM | POA: Diagnosis not present

## 2013-10-24 DIAGNOSIS — J984 Other disorders of lung: Secondary | ICD-10-CM | POA: Diagnosis not present

## 2013-10-24 DIAGNOSIS — G4700 Insomnia, unspecified: Secondary | ICD-10-CM

## 2013-10-24 DIAGNOSIS — Z96649 Presence of unspecified artificial hip joint: Secondary | ICD-10-CM | POA: Diagnosis not present

## 2013-10-24 DIAGNOSIS — R0989 Other specified symptoms and signs involving the circulatory and respiratory systems: Secondary | ICD-10-CM | POA: Diagnosis not present

## 2013-10-24 LAB — CBC WITH DIFFERENTIAL/PLATELET
BASOS ABS: 0.1 10*3/uL (ref 0.0–0.1)
Basophils Relative: 1 % (ref 0–1)
Eosinophils Absolute: 0.3 10*3/uL (ref 0.0–0.7)
Eosinophils Relative: 2 % (ref 0–5)
HCT: 21.1 % — ABNORMAL LOW (ref 39.0–52.0)
Hemoglobin: 7.3 g/dL — ABNORMAL LOW (ref 13.0–17.0)
LYMPHS PCT: 23 % (ref 12–46)
Lymphs Abs: 3.2 10*3/uL (ref 0.7–4.0)
MCH: 32.2 pg (ref 26.0–34.0)
MCHC: 34.6 g/dL (ref 30.0–36.0)
MCV: 93 fL (ref 78.0–100.0)
Monocytes Absolute: 1.8 10*3/uL — ABNORMAL HIGH (ref 0.1–1.0)
Monocytes Relative: 13 % — ABNORMAL HIGH (ref 3–12)
Neutro Abs: 8.6 10*3/uL — ABNORMAL HIGH (ref 1.7–7.7)
Neutrophils Relative %: 61 % (ref 43–77)
PLATELETS: 513 10*3/uL — AB (ref 150–400)
RBC: 2.27 MIL/uL — ABNORMAL LOW (ref 4.22–5.81)
RDW: 18.8 % — AB (ref 11.5–15.5)
WBC: 13.9 10*3/uL — AB (ref 4.0–10.5)

## 2013-10-24 LAB — COMPREHENSIVE METABOLIC PANEL
ALT: 19 U/L (ref 0–53)
AST: 43 U/L — ABNORMAL HIGH (ref 0–37)
Albumin: 4.2 g/dL (ref 3.5–5.2)
Alkaline Phosphatase: 78 U/L (ref 39–117)
Anion gap: 14 (ref 5–15)
BILIRUBIN TOTAL: 7.8 mg/dL — AB (ref 0.3–1.2)
BUN: 6 mg/dL (ref 6–23)
CHLORIDE: 102 meq/L (ref 96–112)
CO2: 22 meq/L (ref 19–32)
CREATININE: 0.58 mg/dL (ref 0.50–1.35)
Calcium: 8.8 mg/dL (ref 8.4–10.5)
GFR calc Af Amer: >90 mL/min (ref >90)
Glucose, Bld: 92 mg/dL (ref 70–99)
Potassium: 3.6 mEq/L — ABNORMAL LOW (ref 3.7–5.3)
Sodium: 138 mEq/L (ref 137–147)
Total Protein: 7.6 g/dL (ref 6.0–8.3)

## 2013-10-24 LAB — RETICULOCYTES
RBC.: 2.27 MIL/uL — ABNORMAL LOW (ref 4.22–5.81)
RETIC CT PCT: 12.7 % — AB (ref 0.4–3.1)
Retic Count, Absolute: 288.3 10*3/uL — ABNORMAL HIGH (ref 19.0–186.0)

## 2013-10-24 MED ORDER — HEPARIN SOD (PORK) LOCK FLUSH 100 UNIT/ML IV SOLN
500.0000 [IU] | INTRAVENOUS | Status: AC | PRN
  Administered 2013-10-24: 500 [IU]
  Filled 2013-10-24: qty 5

## 2013-10-24 MED ORDER — ONDANSETRON HCL 4 MG PO TABS: 4.0000 mg | ORAL_TABLET | ORAL | Status: DC | PRN

## 2013-10-24 MED ORDER — ONDANSETRON HCL 4 MG/2ML IJ SOLN: 4.0000 mg | INTRAMUSCULAR | Status: DC | PRN

## 2013-10-24 MED ORDER — ZOLPIDEM TARTRATE 10 MG PO TABS
10.0000 mg | ORAL_TABLET | Freq: Every evening | ORAL | Status: DC | PRN
Start: 2013-10-24 — End: 2013-10-28

## 2013-10-24 MED ORDER — HYDROMORPHONE HCL 4 MG PO TABS: 4.0000 mg | ORAL_TABLET | ORAL | Status: DC | PRN

## 2013-10-24 MED ORDER — LISINOPRIL 10 MG PO TABS: 10.0000 mg | ORAL_TABLET | Freq: Every day | ORAL | Status: DC

## 2013-10-24 MED ORDER — RIVAROXABAN 20 MG PO TABS: 20.0000 mg | ORAL_TABLET | Freq: Every morning | ORAL | Status: DC

## 2013-10-24 MED ORDER — FOLIC ACID 1 MG PO TABS: 1.0000 mg | ORAL_TABLET | Freq: Every day | ORAL | Status: DC

## 2013-10-24 MED ORDER — NALOXONE HCL 0.4 MG/ML IJ SOLN: 0.4000 mg | INTRAMUSCULAR | Status: DC | PRN

## 2013-10-24 MED ORDER — DIPHENHYDRAMINE HCL 25 MG PO CAPS: 25.0000 mg | ORAL_CAPSULE | ORAL | Status: DC | PRN

## 2013-10-24 MED ORDER — SODIUM CHLORIDE 0.9 % IJ SOLN
10.0000 mL | INTRAMUSCULAR | Status: AC | PRN
  Administered 2013-10-24: 10 mL

## 2013-10-24 MED ORDER — DEXTROSE-NACL 5-0.45 % IV SOLN
INTRAVENOUS | Status: DC
Start: 2013-10-24 — End: 2013-10-24
  Administered 2013-10-24: 15:00:00 via INTRAVENOUS

## 2013-10-24 MED ORDER — KETOROLAC TROMETHAMINE 30 MG/ML IJ SOLN
30.0000 mg | Freq: Four times a day (QID) | INTRAMUSCULAR | Status: DC
Start: 2013-10-24 — End: 2013-10-24
  Administered 2013-10-24: 30 mg via INTRAVENOUS
  Filled 2013-10-24: qty 1

## 2013-10-24 MED ORDER — HYDROMORPHONE 2 MG/ML HIGH CONCENTRATION IV PCA SOLN
INTRAVENOUS | Status: DC
  Administered 2013-10-24: 9.5 mg via INTRAVENOUS
  Administered 2013-10-24: 15:00:00 via INTRAVENOUS
  Filled 2013-10-24: qty 25

## 2013-10-24 MED ORDER — SODIUM CHLORIDE 0.9 % IJ SOLN: 9.0000 mL | INTRAMUSCULAR | Status: DC | PRN

## 2013-10-24 MED ORDER — SODIUM CHLORIDE 0.9 % IV SOLN: 25.0000 mg | INTRAVENOUS | Status: DC | PRN

## 2013-10-24 MED ORDER — SODIUM CHLORIDE 0.9 % IJ SOLN
INTRAMUSCULAR | Status: AC
  Filled 2013-10-24: qty 10

## 2013-10-24 MED ORDER — HYDROMORPHONE HCL 4 MG PO TABS
4.0000 mg | ORAL_TABLET | Freq: Once | ORAL | Status: AC
  Administered 2013-10-24: 4 mg via ORAL
  Filled 2013-10-24: qty 1

## 2013-10-24 NOTE — Telephone Encounter (Signed)
Meds ordered this encounter  Medications  . HYDROmorphone (DILAUDID) 4 MG tablet    Sig: Take 1 tablet (4 mg total) by mouth every 4 (four) hours as needed for severe pain.    Dispense:  90 tablet    Refill:  0    Order Specific Question:  Supervising Provider    Answer:  Liston Alba A [[id number]]  Reviewed [place] Substance Reporting system prior to reorder  Dorena Dew, FNP

## 2013-10-24 NOTE — Accreditation Note (Signed)
Patient reports pain 3/10 and states ready for discharge home. Patient discharged home. Patient in no apparent distress. VSS. Discharge instructions reviewed. Patient acknowledges. Prescriptions given. Patient left day hospital with belongings ambulatory via self.

## 2013-10-24 NOTE — Telephone Encounter (Signed)
Refill request for ambien 10mg . LOV 08/22/2013. Please advise. Thanks!

## 2013-10-24 NOTE — Telephone Encounter (Signed)
Notified pt that, per MD, he may come to day hospital for evaluation; pt verbalizes understanding 

## 2013-10-24 NOTE — Telephone Encounter (Signed)
Refill request for Ambien 10 mg tablets

## 2013-10-24 NOTE — Telephone Encounter (Signed)
Prescriptions for xarelto and lisinopril sent to pharmacy; pt's last office visit was 07/10/2013

## 2013-10-24 NOTE — Telephone Encounter (Signed)
Pt states experiencing back, leg, and thigh pain unrelieved by home medications; pt denies N/V/D, chest pain, or SOB; requests to come to day hospital for evaluation; MD notified

## 2013-10-24 NOTE — Discharge Instructions (Signed)
Sickle Cell Anemia, Adult °Sickle cell anemia is a condition in which red blood cells have an abnormal "sickle" shape. This abnormal shape shortens the cells' life span, which results in a lower than normal concentration of red blood cells in the blood. The sickle shape also causes the cells to clump together and block free blood flow through the blood vessels. As a result, the tissues and organs of the body do not receive enough oxygen. Sickle cell anemia causes organ damage and pain and increases the risk of infection. °CAUSES  °Sickle cell anemia is a genetic disorder. Those who receive two copies of the gene have the condition, and those who receive one copy have the trait. °RISK FACTORS °The sickle cell gene is most common in people whose families originated in [place]. Other areas of the globe where sickle cell trait occurs include the [place], [place] and [place], the [place], and the [place].  °SIGNS AND SYMPTOMS °· Pain, especially in the extremities, back, chest, or abdomen (common). The pain may start suddenly or may develop following an illness, especially if there is dehydration. Pain can also occur due to overexertion or exposure to extreme temperature changes. °· Frequent severe bacterial infections, especially certain types of pneumonia and meningitis. °· Pain and swelling in the hands and feet. °· Decreased activity.   °· Loss of appetite.   °· Change in behavior. °· Headaches. °· Seizures. °· Shortness of breath or difficulty breathing. °· Vision changes. °· Skin ulcers. °Those with the trait may not have symptoms or they may have mild symptoms.  °DIAGNOSIS  °Sickle cell anemia is diagnosed with blood tests that demonstrate the genetic trait. It is often diagnosed during the newborn period, due to mandatory testing nationwide. A variety of blood tests, X-rays, CT scans, MRI scans, ultrasounds, and lung function tests may also be done to monitor the condition. °TREATMENT  °Sickle  cell anemia may be treated with: °· Medicines. You may be given pain medicines, antibiotic medicines (to treat and prevent infections) or medicines to increase the production of certain types of hemoglobin. °· Fluids. °· Oxygen. °· Blood transfusions. °HOME CARE INSTRUCTIONS  °· Drink enough fluid to keep your urine clear or pale yellow. Increase your fluid intake in hot weather and during exercise. °· Do not smoke. Smoking lowers oxygen levels in the blood.   °· Only take over-the-counter or prescription medicines for pain, fever, or discomfort as directed by your health care provider. °· Take antibiotics as directed by your health care provider. Make sure you finish them it even if you start to feel better.   °· Take supplements as directed by your health care provider.   °· Consider wearing a medical alert bracelet. This tells anyone caring for you in an emergency of your condition.   °· When traveling, keep your medical information, health care provider's names, and the medicines you take with you at all times.   °· If you develop a fever, do not take medicines to reduce the fever right away. This could cover up a problem that is developing. Notify your health care provider. °· Keep all follow-up appointments with your health care provider. Sickle cell anemia requires regular medical care. °SEEK MEDICAL CARE IF: ° You have a fever. °SEEK IMMEDIATE MEDICAL CARE IF:  °· You feel dizzy or faint.   °· You have new abdominal pain, especially on the left side near the stomach area.   °· You develop a persistent, often uncomfortable and painful penile erection (priapism). If this is not treated immediately it   will lead to impotence.   °· You have numbness your arms or legs or you have a hard time moving them.   °· You have a hard time with speech.   °· You have a fever or persistent symptoms for more than 2-3 days.   °· You have a fever and your symptoms suddenly get worse.   °· You have signs or symptoms of infection.  These include:   °¨ Chills.   °¨ Abnormal tiredness (lethargy).   °¨ Irritability.   °¨ Poor eating.   °¨ Vomiting.   °· You develop pain that is not helped with medicine.   °· You develop shortness of breath. °· You have pain in your chest.   °· You are coughing up pus-like or bloody sputum.   °· You develop a stiff neck. °· Your feet or hands swell or have pain. °· Your abdomen appears bloated. °· You develop joint pain. °MAKE SURE YOU: °· Understand these instructions. °Document Released: 04/27/2005 Document Revised: 06/03/2013 Document Reviewed: 08/29/2012 °[employer]® Patient Information ©2015 [employer]. This information is not intended to replace advice given to you by your health care provider. Make sure you discuss any questions you have with your health care provider. ° °

## 2013-10-24 NOTE — Discharge Summary (Signed)
Physician Discharge Summary  [name] EGB:[id number] DOB: 22-Dec-1980 DOA: 10/24/2013  PCP: Gerald A., MD  Admit date: 10/24/2013 Discharge date: 10/24/2013  Discharge Diagnoses:  Active Problems:   Sickle cell anemia with pain   Discharge Condition: Stable  Disposition: Home  Diet: Heart Healthy Wt Readings from Last 3 Encounters:  10/17/13 163 lb (73.936 kg)  10/07/13 163 lb 6.4 oz (74.118 kg)  09/22/13 164 lb 11.2 oz (74.707 kg)   Hospital Course:  Patient admitted to day hospital for extended observation. He was started on hypotonic IVFs and given Toradol IV times one. Reviewed labs, consistent with previous values. Potassium mildly decreased, he states that he has not taken Kdur today, recommend that he take home medications. [name] was place on high dose PCA dilaudid per weight based protocol. He used a total of 9.5 mg with 20 demands and 19 deliveries. Pain intensity decreased to 5/10. Patient is functional and maintains that he can manage at home on current medication regimen. He is to follow up in the office with Dr. Zigmund [name] as scheduled.    Discharge Exam:  Filed Vitals:   10/24/13 1645  BP: 113/69  Pulse: 80  Temp:   Resp: 25   Filed Vitals:   10/24/13 1408 10/24/13 1442 10/24/13 1543 10/24/13 1645  BP: 108/81  106/67 113/69  Pulse: 70 64 71 80  Temp: 98.3 F (36.8 C)     TempSrc: Oral     Resp: 20 24 22 25   SpO2: 99% 100% 100% 96%    General: Alert, awake, oriented x3, in no acute distress.  Heart: Irregular rate and rhythm, with murmurs, without  Rubs or gallops, PMI non-displaced, no heaves or thrills on palpation.  Lungs: Clear to auscultation, no wheezing or rhonchi noted. No increased vocal fremitus resonant to percussion  Abdomen: Soft, nontender, nondistended, positive bowel sounds, no masses no hepatosplenomegaly noted..  Neuro: No focal neurological deficits noted cranial nerves II through XII grossly intact. DTRs 2+  bilaterally upper and lower extremities. Strength 5 out of 5 in bilateral upper and lower extremities. Musculoskeletal: No warm swelling or erythema around joints. Low back pain noted Psychiatric: Patient alert and oriented x3, good insight and cognition, good recent to remote recall. Lymph node survey: No cervical axillary or inguinal lymphadenopathy noted.   Discharge Instructions     Medication List    ASK your doctor about these medications       folic acid 1 MG tablet  Commonly known as:  FOLVITE  Take 1 tablet (1 mg total) by mouth every morning.     HYDROmorphone 4 MG tablet  Commonly known as:  DILAUDID  Take 1 tablet (4 mg total) by mouth every 4 (four) hours as needed for severe pain.     hydroxyurea 500 MG capsule  Commonly known as:  HYDREA  Take 2 capsules (1,000 mg total) by mouth daily. May take with food to minimize GI side effects.     lisinopril 10 MG tablet  Commonly known as:  PRINIVIL,ZESTRIL  Take 1 tablet (10 mg total) by mouth daily.     metoprolol tartrate 25 MG tablet  Commonly known as:  LOPRESSOR  Take 1 tablet (25 mg total) by mouth daily.     morphine 30 MG 12 hr tablet  Commonly known as:  MS CONTIN  Take 1 tablet (30 mg total) by mouth every 12 (twelve) hours.     potassium chloride SA 20 MEQ tablet  Commonly known as:  K-DUR,KLOR-CON  Take 1 tablet (20 mEq total) by mouth every morning.     rivaroxaban 20 MG Tabs tablet  Commonly known as:  XARELTO  Take 1 tablet (20 mg total) by mouth every morning.     zolpidem 10 MG tablet  Commonly known as:  AMBIEN  Take 1 tablet (10 mg total) by mouth at bedtime as needed for sleep.          The results of significant diagnostics from this hospitalization (including imaging, microbiology, ancillary and laboratory) are listed below for reference.    Significant Diagnostic Studies: Dg Chest 2 View  10/07/2013   CLINICAL DATA:  Sickle-cell crisis.  EXAM: CHEST  2 VIEW  COMPARISON:   10/05/2013  FINDINGS: Left subclavian Port-A-Cath is unchanged. Lungs are somewhat hypoinflated with prominence of the perihilar markings. Mild stable cardiomegaly. Remainder the exam is unchanged.  IMPRESSION: Mild prominence of the perihilar markings which may be due to mild vascular congestion versus viral bronchopneumonia.  Stable cardiomegaly.   Electronically Signed   By: Marin Olp M.D.   On: 10/07/2013 13:49   Dg Chest 2 View  10/05/2013   CLINICAL DATA:  Sickle cell anemia crisis and back pain.  EXAM: CHEST - 2 VIEW  COMPARISON:  09/22/2013  FINDINGS: Port-A-Cath positioning is stable. Stable appearance of scattered areas of pulmonary scarring bilaterally. There is no evidence of pulmonary edema, consolidation, pneumothorax, nodule or pleural fluid. The heart size is stable and at the upper limits of normal. The visualized spine shows no fractures or significant degenerative disease.  IMPRESSION: No active disease.  Stable pulmonary scarring.   Electronically Signed   By: Aletta Edouard M.D.   On: 10/05/2013 08:16    Microbiology: No results found for this or any previous visit (from the past 240 hour(s)).   Labs: Basic Metabolic Panel:  Recent Labs Lab 10/24/13 1430  NA 138  K 3.6*  CL 102  CO2 22  GLUCOSE 92  BUN 6  CREATININE 0.58  CALCIUM 8.8   Liver Function Tests:  Recent Labs Lab 10/24/13 1430  AST 43*  ALT 19  ALKPHOS 78  BILITOT 7.8*  PROT 7.6  ALBUMIN 4.2   No results found for this basename: LIPASE, AMYLASE,  in the last 168 hours No results found for this basename: AMMONIA,  in the last 168 hours CBC:  Recent Labs Lab 10/24/13 1430  WBC 13.9*  NEUTROABS 8.6*  HGB 7.3*  HCT 21.1*  MCV 93.0  PLT 513*   Cardiac Enzymes: No results found for this basename: CKTOTAL, CKMB, CKMBINDEX, TROPONINI,  in the last 168 hours BNP: No components found with this basename: POCBNP,  CBG: No results found for this basename: GLUCAP,  in the last 168  hours Ferritin: No results found for this basename: FERRITIN,  in the last 168 hours  Time coordinating discharge: Greater than 30 minutes  Signed:  Izel Eisenhardt M  10/24/2013, 5:45 PM

## 2013-10-24 NOTE — Telephone Encounter (Signed)
Prescription placed for Ambien 10 mg #30 with 3 refills

## 2013-10-24 NOTE — Telephone Encounter (Signed)
Refill request for lisinopril and xarelto

## 2013-10-24 NOTE — H&P (Signed)
[facility] History and Physical   Date: 10/24/2013  Patient name: [name] Medical record number: [id number] Date of birth: Apr 02, 1980 Age: 33 y.o. Gender: male PCP: [name],[name], MD  Attending physician: Elwyn Reach, MD  Chief Complaint: Mid back pain  History of Present Illness: Patient with a history of sickle cell disease, HbSS and frequent vasoocclusive crises presents with pain primarily to mid-back. He states that pain intensity increased initially this past Sunday. He attributes current pain crisis to changes in weather. He reports that he attempted to increase hydration at the 1st signs of crisis. He states that current pain intensity is 8/10 described as throbbing and intermittent to mid back. He last had pain medication around 9 am with moderate relief, he attempted to go to work and had to leave mid-shift due to increase in pain intensity. [name] currently denies fever, headache, chest pains, nausea, vomiting, or diarrhea. Will admit to day infusion center for extended observation.   Meds: Prescriptions prior to admission  Medication Sig Dispense Refill  . folic acid (FOLVITE) 1 MG tablet Take 1 tablet (1 mg total) by mouth every morning.  30 tablet  11  . HYDROmorphone (DILAUDID) 4 MG tablet Take 1 tablet (4 mg total) by mouth every 4 (four) hours as needed for severe pain.  90 tablet  0  . hydroxyurea (HYDREA) 500 MG capsule Take 2 capsules (1,000 mg total) by mouth daily. May take with food to minimize GI side effects.  60 capsule  3  . lisinopril (PRINIVIL,ZESTRIL) 10 MG tablet Take 1 tablet (10 mg total) by mouth daily.  30 tablet  0  . metoprolol tartrate (LOPRESSOR) 25 MG tablet Take 1 tablet (25 mg total) by mouth daily.  30 tablet  6  . morphine (MS CONTIN) 30 MG 12 hr tablet Take 1 tablet (30 mg total) by mouth every 12 (twelve) hours.  60 tablet  0  . potassium chloride SA (K-DUR,KLOR-CON) 20 MEQ tablet Take 1 tablet (20 mEq  total) by mouth every morning.  30 tablet  3  . rivaroxaban (XARELTO) 20 MG TABS tablet Take 1 tablet (20 mg total) by mouth every morning.  30 tablet  0  . zolpidem (AMBIEN) 10 MG tablet Take 1 tablet (10 mg total) by mouth at bedtime as needed for sleep.  30 tablet  3    Allergies: Morphine and related Past Medical History  Diagnosis Date  . Sickle cell anemia   . Blood transfusion   . 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 Hypokalemia   . Mood disorder   . History of pulmonary embolus (PE)   . Avascular necrosis   . Leukocytosis     Chronic  . Thrombocytosis     Chronic  . Hypertension   . History of Clostridium difficile infection   . Uses marijuana   . Chronic anticoagulation   . Functional asplenia   . Former smoker   . Second hand tobacco smoke exposure   . Alcohol consumption of one to four drinks per day    Past Surgical History  Procedure Laterality Date  . Right hip replacement      08/2006  . Cholecystectomy      01/2008  . Porta cath placement    . Porta cath removal    . Umbilical hernia repair      12 /2009  . Excision of left periauricular cyst  10/2009  . Excision of right ear lobe cyst with primary closur      11/2007  . Portacath placement  01/05/2012    Procedure: INSERTION PORT-A-CATH;  Surgeon: Odis Hollingshead, MD;  Location: [facility];  Service: General;  Laterality: N/A;  ultrasound guiced port a cath insertion with fluoroscopy   Family History  Problem Relation Age of Onset  . Sickle cell anemia Mother   . Sickle cell anemia Father   . Sickle cell trait Brother    History   Social History  . Marital Status: Single    Spouse Name: N/A    Number of Children: 0  . Years of Education: 13   Occupational History  . 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 Smoking status: Former Smoker -- 13 years    Quit date: 07/08/2010  . Smokeless tobacco: Never Used   . Alcohol Use: No  . Drug Use: 2.00 per week  . Sexual Activity: Yes    Partners: Female    Patent examiner Protection: None   Other Topics Concern  . Not on file   Social History Narrative   Lives in an apartment.  Single.  Lives alone but has a girlfriend that helps care for him.  Does not use any assist devices.        [name]:  [phone] Mom, emergency contact    Review of Systems: Constitutional: positive for fatigue Eyes: positive for icterus Ears, nose, mouth, throat, and face: negative Respiratory: negative Cardiovascular: negative Gastrointestinal: negative Genitourinary:negative Integument/breast: negative Hematologic/lymphatic: negative Musculoskeletal:positive for back pain and myalgias Neurological: negative Behavioral/Psych: negative Endocrine: negative  Physical Exam: Blood pressure 108/81, pulse 64, temperature 98.3 F (36.8 C), temperature source Oral, resp. rate 24, SpO2 100.00%. BP 113/69  Pulse 80  Temp(Src) 98.3 F (36.8 C) (Oral)  Resp 25  SpO2 96% General appearance: alert, cooperative, fatigued, icteric and mild distress Head: Normocephalic, without obvious abnormality, atraumatic Eyes: positive findings: sclera icterus Ears: normal TM's and external ear canals both ears Nose: Nares normal. Septum midline. Mucosa normal. No drainage or sinus tenderness. Throat: lips, mucosa, and tongue normal; teeth and gums normal Neck: no adenopathy, no carotid bruit, no JVD, supple, symmetrical, trachea midline and thyroid not enlarged, symmetric, no tenderness/mass/nodules Back: range of motion normal, symmetric, no curvature. ROM normal. No CVA tenderness. Lungs: clear to auscultation bilaterally Chest wall: no tenderness Heart: irregularly irregular rhythm Extremities: extremities normal, atraumatic, no cyanosis or edema Pulses: 2+ and symmetric Skin: Skin color, texture, turgor normal. No rashes or lesions Lymph nodes: Cervical,  supraclavicular, and axillary nodes normal. Neurologic: Alert and oriented X 3, normal strength and tone. Normal symmetric reflexes. Normal coordination and gait  Lab results: Results for orders placed during the hospital encounter of 10/24/13 (from the past 24 hour(s))  CBC WITH DIFFERENTIAL     Status: Abnormal   Collection Time    10/24/13  2:30 PM      Result Value Ref Range   WBC 13.9 (*) 4.0 - 10.5 K/uL   RBC 2.27 (*) 4.22 - 5.81 MIL/uL   Hemoglobin 7.3 (*) 13.0 - 17.0 g/dL   HCT 21.1 (*) 39.0 - 52.0 %   MCV 93.0  78.0 - 100.0 fL   MCH 32.2  26.0 - 34.0 pg   MCHC 34.6  30.0 - 36.0 g/dL   RDW 18.8 (*) 11.5 - 15.5 %   Platelets  513 (*) 150 - 400 K/uL   Neutrophils Relative % 61  43 - 77 %   Neutro Abs 8.6 (*) 1.7 - 7.7 K/uL   Lymphocytes Relative 23  12 - 46 %   Lymphs Abs 3.2  0.7 - 4.0 K/uL   Monocytes Relative 13 (*) 3 - 12 %   Monocytes Absolute 1.8 (*) 0.1 - 1.0 K/uL   Eosinophils Relative 2  0 - 5 %   Eosinophils Absolute 0.3  0.0 - 0.7 K/uL   Basophils Relative 1  0 - 1 %   Basophils Absolute 0.1  0.0 - 0.1 K/uL  RETICULOCYTES     Status: Abnormal   Collection Time    10/24/13  2:30 PM      Result Value Ref Range   Retic Ct Pct 12.7 (*) 0.4 - 3.1 %   RBC. 2.27 (*) 4.22 - 5.81 MIL/uL   Retic Count, Manual 288.3 (*) 19.0 - 186.0 K/uL    Imaging results:  No results found.   Assessment & Plan:  Sickle cell anemia: Will admit to day infusion center for extended observation Will start hypotonic intravenous fluids for cellular re-hydration Toradol IV times 1 dose for inflammation Will start high dose PCA dilaudid. Will monitor pain intensity closely throughout extended observation Will check CBCw/diff, reticulocytes, and CMP  *Plan outlined and reviewed by Dr. Luberta Robertson M 10/24/2013, 3:26 PM

## 2013-10-26 ENCOUNTER — Encounter (HOSPITAL_COMMUNITY): Payer: Self-pay | Admitting: Emergency Medicine

## 2013-10-26 ENCOUNTER — Emergency Department (HOSPITAL_COMMUNITY)
Admission: EM | Admit: 2013-10-26 | Discharge: 2013-10-26 | Disposition: A | Discharge status: Home / Self Care | Payer: Medicare Other | Source: Home / Self Care | Attending: Emergency Medicine | Admitting: Emergency Medicine

## 2013-10-26 DIAGNOSIS — Z87891 Personal history of nicotine dependence: Secondary | ICD-10-CM | POA: Insufficient documentation

## 2013-10-26 DIAGNOSIS — D5700 Hb-SS disease with crisis, unspecified: Secondary | ICD-10-CM

## 2013-10-26 DIAGNOSIS — I10 Essential (primary) hypertension: Secondary | ICD-10-CM | POA: Insufficient documentation

## 2013-10-26 DIAGNOSIS — Z86711 Personal history of pulmonary embolism: Secondary | ICD-10-CM | POA: Insufficient documentation

## 2013-10-26 DIAGNOSIS — E876 Hypokalemia: Secondary | ICD-10-CM

## 2013-10-26 DIAGNOSIS — Z86718 Personal history of other venous thrombosis and embolism: Secondary | ICD-10-CM | POA: Insufficient documentation

## 2013-10-26 DIAGNOSIS — Z79899 Other long term (current) drug therapy: Secondary | ICD-10-CM | POA: Insufficient documentation

## 2013-10-26 DIAGNOSIS — Z7901 Long term (current) use of anticoagulants: Secondary | ICD-10-CM | POA: Insufficient documentation

## 2013-10-26 LAB — COMPREHENSIVE METABOLIC PANEL
ALT: 24 U/L (ref 0–53)
AST: 38 U/L — AB (ref 0–37)
Albumin: 4.2 g/dL (ref 3.5–5.2)
Alkaline Phosphatase: 79 U/L (ref 39–117)
Anion gap: 13 (ref 5–15)
BUN: 5 mg/dL — ABNORMAL LOW (ref 6–23)
CALCIUM: 9 mg/dL (ref 8.4–10.5)
CO2: 21 mEq/L (ref 19–32)
Chloride: 103 mEq/L (ref 96–112)
Creatinine, Ser: 0.59 mg/dL (ref 0.50–1.35)
GFR calc non Af Amer: >90 mL/min (ref >90)
GLUCOSE: 94 mg/dL (ref 70–99)
Potassium: 3.6 mEq/L — ABNORMAL LOW (ref 3.7–5.3)
SODIUM: 137 meq/L (ref 137–147)
TOTAL PROTEIN: 7.6 g/dL (ref 6.0–8.3)
Total Bilirubin: 5.8 mg/dL — ABNORMAL HIGH (ref 0.3–1.2)

## 2013-10-26 LAB — CBC WITH DIFFERENTIAL/PLATELET
BASOS ABS: 0.1 10*3/uL (ref 0.0–0.1)
Basophils Relative: 1 % (ref 0–1)
EOS ABS: 0.3 10*3/uL (ref 0.0–0.7)
EOS PCT: 1 % (ref 0–5)
HCT: 22.4 % — ABNORMAL LOW (ref 39.0–52.0)
Hemoglobin: 7.7 g/dL — ABNORMAL LOW (ref 13.0–17.0)
Lymphocytes Relative: 13 % (ref 12–46)
Lymphs Abs: 2.4 10*3/uL (ref 0.7–4.0)
MCH: 32.4 pg (ref 26.0–34.0)
MCHC: 34.4 g/dL (ref 30.0–36.0)
MCV: 94.1 fL (ref 78.0–100.0)
Monocytes Absolute: 2.1 10*3/uL — ABNORMAL HIGH (ref 0.1–1.0)
Monocytes Relative: 12 % (ref 3–12)
Neutro Abs: 13.7 10*3/uL — ABNORMAL HIGH (ref 1.7–7.7)
Neutrophils Relative %: 73 % (ref 43–77)
PLATELETS: 534 10*3/uL — AB (ref 150–400)
RBC: 2.38 MIL/uL — ABNORMAL LOW (ref 4.22–5.81)
RDW: 19.9 % — ABNORMAL HIGH (ref 11.5–15.5)
WBC: 18.5 10*3/uL — ABNORMAL HIGH (ref 4.0–10.5)

## 2013-10-26 LAB — RETICULOCYTES
RBC.: 2.38 MIL/uL — ABNORMAL LOW (ref 4.22–5.81)
RETIC COUNT ABSOLUTE: 378.4 10*3/uL — AB (ref 19.0–186.0)
RETIC CT PCT: 15.9 % — AB (ref 0.4–3.1)

## 2013-10-26 MED ORDER — HYDROMORPHONE HCL 1 MG/ML IJ SOLN
1.0000 mg | Freq: Once | INTRAMUSCULAR | Status: AC
  Administered 2013-10-26: 1 mg via INTRAVENOUS
  Filled 2013-10-26: qty 1

## 2013-10-26 MED ORDER — HEPARIN SOD (PORK) LOCK FLUSH 100 UNIT/ML IV SOLN
500.0000 [IU] | Freq: Once | INTRAVENOUS | Status: AC
  Administered 2013-10-26: 500 [IU]
  Filled 2013-10-26: qty 5

## 2013-10-26 MED ORDER — ONDANSETRON HCL 4 MG/2ML IJ SOLN
4.0000 mg | Freq: Once | INTRAMUSCULAR | Status: AC
  Administered 2013-10-26: 4 mg via INTRAVENOUS
  Filled 2013-10-26: qty 2

## 2013-10-26 MED ORDER — DIPHENHYDRAMINE HCL 50 MG/ML IJ SOLN
25.0000 mg | Freq: Once | INTRAMUSCULAR | Status: AC
  Administered 2013-10-26: 25 mg via INTRAVENOUS
  Filled 2013-10-26: qty 1

## 2013-10-26 NOTE — Discharge Instructions (Signed)
Sickle Cell Anemia, Adult °Sickle cell anemia is a condition in which red blood cells have an abnormal "sickle" shape. This abnormal shape shortens the cells' life span, which results in a lower than normal concentration of red blood cells in the blood. The sickle shape also causes the cells to clump together and block free blood flow through the blood vessels. As a result, the tissues and organs of the body do not receive enough oxygen. Sickle cell anemia causes organ damage and pain and increases the risk of infection. °CAUSES  °Sickle cell anemia is a genetic disorder. Those who receive two copies of the gene have the condition, and those who receive one copy have the trait. °RISK FACTORS °The sickle cell gene is most common in people whose families originated in [place]. Other areas of the globe where sickle cell trait occurs include the [place], [place] and [place], the [place], and the [place].  °SIGNS AND SYMPTOMS °· Pain, especially in the extremities, back, chest, or abdomen (common). The pain may start suddenly or may develop following an illness, especially if there is dehydration. Pain can also occur due to overexertion or exposure to extreme temperature changes. °· Frequent severe bacterial infections, especially certain types of pneumonia and meningitis. °· Pain and swelling in the hands and feet. °· Decreased activity.   °· Loss of appetite.   °· Change in behavior. °· Headaches. °· Seizures. °· Shortness of breath or difficulty breathing. °· Vision changes. °· Skin ulcers. °Those with the trait may not have symptoms or they may have mild symptoms.  °DIAGNOSIS  °Sickle cell anemia is diagnosed with blood tests that demonstrate the genetic trait. It is often diagnosed during the newborn period, due to mandatory testing nationwide. A variety of blood tests, X-rays, CT scans, MRI scans, ultrasounds, and lung function tests may also be done to monitor the condition. °TREATMENT  °Sickle  cell anemia may be treated with: °· Medicines. You may be given pain medicines, antibiotic medicines (to treat and prevent infections) or medicines to increase the production of certain types of hemoglobin. °· Fluids. °· Oxygen. °· Blood transfusions. °HOME CARE INSTRUCTIONS  °· Drink enough fluid to keep your urine clear or pale yellow. Increase your fluid intake in hot weather and during exercise. °· Do not smoke. Smoking lowers oxygen levels in the blood.   °· Only take over-the-counter or prescription medicines for pain, fever, or discomfort as directed by your health care provider. °· Take antibiotics as directed by your health care provider. Make sure you finish them it even if you start to feel better.   °· Take supplements as directed by your health care provider.   °· Consider wearing a medical alert bracelet. This tells anyone caring for you in an emergency of your condition.   °· When traveling, keep your medical information, health care provider's names, and the medicines you take with you at all times.   °· If you develop a fever, do not take medicines to reduce the fever right away. This could cover up a problem that is developing. Notify your health care provider. °· Keep all follow-up appointments with your health care provider. Sickle cell anemia requires regular medical care. °SEEK MEDICAL CARE IF: ° You have a fever. °SEEK IMMEDIATE MEDICAL CARE IF:  °· You feel dizzy or faint.   °· You have new abdominal pain, especially on the left side near the stomach area.   °· You develop a persistent, often uncomfortable and painful penile erection (priapism). If this is not treated immediately it   will lead to impotence.   °· You have numbness your arms or legs or you have a hard time moving them.   °· You have a hard time with speech.   °· You have a fever or persistent symptoms for more than 2-3 days.   °· You have a fever and your symptoms suddenly get worse.   °· You have signs or symptoms of infection.  These include:   °¨ Chills.   °¨ Abnormal tiredness (lethargy).   °¨ Irritability.   °¨ Poor eating.   °¨ Vomiting.   °· You develop pain that is not helped with medicine.   °· You develop shortness of breath. °· You have pain in your chest.   °· You are coughing up pus-like or bloody sputum.   °· You develop a stiff neck. °· Your feet or hands swell or have pain. °· Your abdomen appears bloated. °· You develop joint pain. °MAKE SURE YOU: °· Understand these instructions. °Document Released: 04/27/2005 Document Revised: 06/03/2013 Document Reviewed: 08/29/2012 °[employer]® Patient Information ©2015 [employer]. This information is not intended to replace advice given to you by your health care provider. Make sure you discuss any questions you have with your health care provider. ° °

## 2013-10-26 NOTE — ED Notes (Signed)
Pt reports having sickle cell pain for one week. Pt reports pain in his bilateral rib pain and mid-upper back pain. Pt reports his last sickle cell crisis was one month ago. Pt is A/O x4, in NAD, and vitals are WDL.

## 2013-10-26 NOTE — ED Provider Notes (Signed)
CSN: [id number]     Arrival date & time 10/26/13  1404 History   First MD Initiated Contact with Patient 10/26/13 1417     Chief Complaint  Patient presents with  . Sickle Cell Pain Crisis     (Consider location/radiation/quality/duration/timing/severity/associated sxs/prior Treatment) HPI Comments: Patient is a 33 year old male with history of sickle cell anemia, pulmonary embolism, avascular necrosis, hypertension who presents emergency Department with one week of gradually worsening back and side pain. She reports that this pain is typical of his sickle cell pain. He describes his pain as throbbing. He has taken his home medications which include Dilaudid and MS Contin without relief. He denies fever, chills, shortness of breath, chest pain, nausea, vomiting, abdominal pain, rash.  Patient is a 33 y.o. male presenting with sickle cell pain. The history is provided by the patient. No language interpreter was used.  Sickle Cell Pain Crisis Associated symptoms: no chest pain, no fever, no nausea, no shortness of breath and no vomiting     Past Medical History  Diagnosis Date  . Sickle cell anemia   . Blood transfusion   . 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 Hypokalemia   . Mood disorder   . History of pulmonary embolus (PE)   . Avascular necrosis   . Leukocytosis     Chronic  . Thrombocytosis     Chronic  . Hypertension   . History of Clostridium difficile infection   . Uses marijuana   . Chronic anticoagulation   . Functional asplenia   . Former smoker   . Second hand tobacco smoke exposure   . Alcohol consumption of one to four drinks per day    Past Surgical History  Procedure Laterality Date  . Right hip replacement      08/2006  . Cholecystectomy      01/2008  . Porta cath placement    . Porta cath removal    . Umbilical hernia repair      01/2008  . Excision of left periauricular cyst      10/2009  . Excision of right ear lobe cyst  with primary closur      11/2007  . Portacath placement  01/05/2012    Procedure: INSERTION PORT-A-CATH;  Surgeon: Odis Hollingshead, MD;  Location: [facility];  Service: General;  Laterality: N/A;  ultrasound guiced port a cath insertion with fluoroscopy   Family History  Problem Relation Age of Onset  . Sickle cell anemia Mother   . Sickle cell anemia Father   . Sickle cell trait Brother    History  Substance Use Topics  . Smoking status: Former Smoker -- 13 years    Quit date: 07/08/2010  . Smokeless tobacco: Never Used  . Alcohol Use: No    Review of Systems  Constitutional: Negative for fever and chills.  Respiratory: Negative for shortness of breath.   Cardiovascular: Negative for chest pain.  Gastrointestinal: Negative for nausea, vomiting and abdominal pain.  Musculoskeletal: Positive for back pain.  All other systems reviewed and are negative.     Allergies  Morphine and related  Home Medications   Prior to Admission medications   Medication Sig Start Date End Date Taking? Authorizing Provider  folic acid (FOLVITE) 1 MG tablet Take 1 tablet (1 mg total) by mouth every morning. 10/08/13   [name], MD  HYDROmorphone (DILAUDID) 4 MG tablet Take 1 tablet (4 mg total) by mouth every 4 (four)  hours as needed for severe pain. 10/24/13   Dorena Dew, FNP  hydroxyurea (HYDREA) 500 MG capsule Take 2 capsules (1,000 mg total) by mouth daily. May take with food to minimize GI side effects. 10/08/13   [name], MD  lisinopril (PRINIVIL,ZESTRIL) 10 MG tablet Take 1 tablet (10 mg total) by mouth daily. 10/24/13   Dorena Dew, FNP  metoprolol tartrate (LOPRESSOR) 25 MG tablet Take 1 tablet (25 mg total) by mouth daily. 08/22/13   [name], MD  morphine (MS CONTIN) 30 MG 12 hr tablet Take 1 tablet (30 mg total) by mouth every 12 (twelve) hours. 09/26/13   [name], MD  potassium chloride SA (K-DUR,KLOR-CON) 20 MEQ tablet Take 1 tablet (20  mEq total) by mouth every morning. 09/25/13   [name], MD  rivaroxaban (XARELTO) 20 MG TABS tablet Take 1 tablet (20 mg total) by mouth every morning. 10/24/13   Dorena Dew, FNP  zolpidem (AMBIEN) 10 MG tablet Take 1 tablet (10 mg total) by mouth at bedtime as needed for sleep. 10/24/13   [name], MD   BP 121/78  Pulse 93  Temp(Src) 98.5 F (36.9 C) (Oral)  Resp 18  SpO2 99% Physical Exam  Nursing note and vitals reviewed. Constitutional: He is oriented to person, place, and time. He appears well-developed and well-nourished. No distress.  Patient appears comfortable, in no acute distress  HENT:  Head: Normocephalic and atraumatic.  Right Ear: External ear normal.  Left Ear: External ear normal.  Nose: Nose normal.  Eyes: Conjunctivae are normal.  Neck: Normal range of motion. No tracheal deviation present.  Cardiovascular: Normal rate, regular rhythm, normal heart sounds, intact distal pulses and normal pulses.   Pulses:      Radial pulses are 2+ on the right side, and 2+ on the left side.       Posterior tibial pulses are 2+ on the right side, and 2+ on the left side.  Pulmonary/Chest: Effort normal and breath sounds normal. No stridor.  Abdominal: Soft. He exhibits no distension. There is no tenderness.  Musculoskeletal: Normal range of motion.  Neurological: He is alert and oriented to person, place, and time.  Skin: Skin is warm and dry. He is not diaphoretic.  Psychiatric: He has a normal mood and affect. His behavior is normal.    ED Course  Procedures (including critical care time) Labs Review Labs Reviewed  CBC WITH DIFFERENTIAL - Abnormal; Notable for the following:    WBC 18.5 (*)    RBC 2.38 (*)    Hemoglobin 7.7 (*)    HCT 22.4 (*)    RDW 19.9 (*)    Platelets 534 (*)    Neutro Abs 13.7 (*)    Monocytes Absolute 2.1 (*)    All other components within normal limits  RETICULOCYTES - Abnormal; Notable for the following:    Retic Ct  Pct 15.9 (*)    RBC. 2.38 (*)    Retic Count, Manual 378.4 (*)    All other components within normal limits  COMPREHENSIVE METABOLIC PANEL    Imaging Review No results found.   EKG Interpretation None      MDM   Final diagnoses:  Sickle cell pain crisis    Patient presents to ED for evaluation of sickle cell pain crisis. Patient without chest pain, shortness of breath, fever. Normal vital signs. No concern for acute chest syndrome. Labs consistent with crisis. Pain controlled with IV  narcotics. Patient will follow up with PCP. Discussed reasons to return to ED immediately. Vital signs stable for discharge. Discussed case with Dr. Betsey Holiday who agrees with plan. Patient / Family / Caregiver informed of clinical course, understand medical decision-making process, and agree with plan.     Elwyn Lade, PA-C 10/26/13 [phone]

## 2013-10-27 ENCOUNTER — Inpatient Hospital Stay (HOSPITAL_COMMUNITY): Payer: Medicare Other

## 2013-10-27 ENCOUNTER — Inpatient Hospital Stay (HOSPITAL_COMMUNITY)
Admission: EM | Admit: 2013-10-27 | Discharge: 2013-10-30 | DRG: 812 | Disposition: A | Discharge status: Home / Self Care | Payer: Medicare Other | Attending: Internal Medicine | Admitting: Internal Medicine

## 2013-10-27 ENCOUNTER — Encounter (HOSPITAL_COMMUNITY): Payer: Self-pay | Admitting: Emergency Medicine

## 2013-10-27 ENCOUNTER — Emergency Department (HOSPITAL_COMMUNITY): Payer: Medicare Other

## 2013-10-27 DIAGNOSIS — I10 Essential (primary) hypertension: Secondary | ICD-10-CM | POA: Diagnosis present

## 2013-10-27 DIAGNOSIS — Z86711 Personal history of pulmonary embolism: Secondary | ICD-10-CM | POA: Diagnosis present

## 2013-10-27 DIAGNOSIS — S79911A Unspecified injury of right hip, initial encounter: Secondary | ICD-10-CM

## 2013-10-27 DIAGNOSIS — D72829 Elevated white blood cell count, unspecified: Secondary | ICD-10-CM

## 2013-10-27 DIAGNOSIS — M8700 Idiopathic aseptic necrosis of unspecified bone: Secondary | ICD-10-CM

## 2013-10-27 DIAGNOSIS — M25559 Pain in unspecified hip: Secondary | ICD-10-CM

## 2013-10-27 DIAGNOSIS — D5700 Hb-SS disease with crisis, unspecified: Principal | ICD-10-CM

## 2013-10-27 DIAGNOSIS — G4700 Insomnia, unspecified: Secondary | ICD-10-CM

## 2013-10-27 LAB — CBC WITH DIFFERENTIAL/PLATELET
BASOS PCT: 0 % (ref 0–1)
Basophils Absolute: 0.1 10*3/uL (ref 0.0–0.1)
EOS ABS: 0.1 10*3/uL (ref 0.0–0.7)
EOS PCT: 0 % (ref 0–5)
HCT: 22.1 % — ABNORMAL LOW (ref 39.0–52.0)
Hemoglobin: 7.6 g/dL — ABNORMAL LOW (ref 13.0–17.0)
Lymphocytes Relative: 9 % — ABNORMAL LOW (ref 12–46)
Lymphs Abs: 1.7 10*3/uL (ref 0.7–4.0)
MCH: 32.2 pg (ref 26.0–34.0)
MCHC: 34.4 g/dL (ref 30.0–36.0)
MCV: 93.6 fL (ref 78.0–100.0)
Monocytes Absolute: 1.7 10*3/uL — ABNORMAL HIGH (ref 0.1–1.0)
Monocytes Relative: 9 % (ref 3–12)
Neutro Abs: 14.9 10*3/uL — ABNORMAL HIGH (ref 1.7–7.7)
Neutrophils Relative %: 82 % — ABNORMAL HIGH (ref 43–77)
PLATELETS: 540 10*3/uL — AB (ref 150–400)
RBC: 2.36 MIL/uL — ABNORMAL LOW (ref 4.22–5.81)
RDW: 19.5 % — ABNORMAL HIGH (ref 11.5–15.5)
WBC: 18.4 10*3/uL — ABNORMAL HIGH (ref 4.0–10.5)

## 2013-10-27 LAB — I-STAT TROPONIN, ED: Troponin i, poc: 0.04 ng/mL (ref 0.00–0.08)

## 2013-10-27 LAB — BASIC METABOLIC PANEL
Anion gap: 15 (ref 5–15)
BUN: 7 mg/dL (ref 6–23)
CALCIUM: 9.1 mg/dL (ref 8.4–10.5)
CO2: 21 mEq/L (ref 19–32)
Chloride: 101 mEq/L (ref 96–112)
Creatinine, Ser: 0.71 mg/dL (ref 0.50–1.35)
GLUCOSE: 117 mg/dL — AB (ref 70–99)
POTASSIUM: 3.7 meq/L (ref 3.7–5.3)
SODIUM: 137 meq/L (ref 137–147)

## 2013-10-27 LAB — RETICULOCYTES
RBC.: 2.36 MIL/uL — AB (ref 4.22–5.81)
RETIC CT PCT: 17.2 % — AB (ref 0.4–3.1)
Retic Count, Absolute: 405.9 10*3/uL — ABNORMAL HIGH (ref 19.0–186.0)

## 2013-10-27 MED ORDER — SODIUM CHLORIDE 0.9 % IV SOLN: INTRAVENOUS | Status: DC

## 2013-10-27 MED ORDER — SODIUM CHLORIDE 0.9 % IV BOLUS (SEPSIS)
1000.0000 mL | Freq: Once | INTRAVENOUS | Status: AC
  Administered 2013-10-27: 1000 mL via INTRAVENOUS

## 2013-10-27 MED ORDER — SODIUM CHLORIDE 0.9 % IJ SOLN
INTRAMUSCULAR | Status: AC
  Administered 2013-10-27: 10 mL
  Filled 2013-10-27: qty 10

## 2013-10-27 MED ORDER — ONDANSETRON HCL 4 MG/2ML IJ SOLN: 4.0000 mg | Freq: Three times a day (TID) | INTRAMUSCULAR | Status: DC | PRN

## 2013-10-27 MED ORDER — RIVAROXABAN 20 MG PO TABS
20.0000 mg | ORAL_TABLET | Freq: Every morning | ORAL | Status: DC
  Administered 2013-10-27 – 2013-10-30 (×4): 20 mg via ORAL
  Filled 2013-10-27 (×5): qty 1

## 2013-10-27 MED ORDER — HYDROMORPHONE HCL 1 MG/ML IJ SOLN
1.0000 mg | Freq: Once | INTRAMUSCULAR | Status: AC
  Administered 2013-10-27: 1 mg via INTRAVENOUS
  Filled 2013-10-27: qty 1

## 2013-10-27 MED ORDER — SODIUM CHLORIDE 0.9 % IV SOLN
INTRAVENOUS | Status: DC
  Administered 2013-10-27 – 2013-10-28 (×2): via INTRAVENOUS

## 2013-10-27 MED ORDER — INFLUENZA VAC SPLIT QUAD 0.5 ML IM SUSY
0.5000 mL | PREFILLED_SYRINGE | INTRAMUSCULAR | Status: AC
  Administered 2013-10-28: 0.5 mL via INTRAMUSCULAR
  Filled 2013-10-27 (×2): qty 0.5

## 2013-10-27 MED ORDER — DIPHENHYDRAMINE HCL 25 MG PO CAPS
25.0000 mg | ORAL_CAPSULE | Freq: Three times a day (TID) | ORAL | Status: DC | PRN
  Administered 2013-10-27 – 2013-10-28 (×2): 25 mg via ORAL
  Filled 2013-10-27 (×3): qty 1

## 2013-10-27 MED ORDER — HYDROMORPHONE HCL 1 MG/ML IJ SOLN: 1.0000 mg | INTRAMUSCULAR | Status: DC | PRN

## 2013-10-27 MED ORDER — KETOROLAC TROMETHAMINE 30 MG/ML IJ SOLN
30.0000 mg | Freq: Once | INTRAMUSCULAR | Status: AC
  Administered 2013-10-27: 30 mg via INTRAVENOUS
  Filled 2013-10-27: qty 1

## 2013-10-27 MED ORDER — HYDROMORPHONE HCL 2 MG/ML IJ SOLN
2.0000 mg | INTRAMUSCULAR | Status: DC | PRN
  Administered 2013-10-27 – 2013-10-28 (×5): 2 mg via INTRAVENOUS
  Filled 2013-10-27 (×5): qty 1

## 2013-10-27 MED ORDER — FOLIC ACID 1 MG PO TABS
1.0000 mg | ORAL_TABLET | Freq: Every morning | ORAL | Status: DC
  Administered 2013-10-27 – 2013-10-30 (×4): 1 mg via ORAL
  Filled 2013-10-27 (×5): qty 1

## 2013-10-27 MED ORDER — METOPROLOL TARTRATE 25 MG PO TABS
25.0000 mg | ORAL_TABLET | Freq: Every day | ORAL | Status: DC
  Administered 2013-10-27 – 2013-10-30 (×4): 25 mg via ORAL
  Filled 2013-10-27 (×5): qty 1

## 2013-10-27 MED ORDER — ONDANSETRON HCL 4 MG/2ML IJ SOLN: 4.0000 mg | Freq: Four times a day (QID) | INTRAMUSCULAR | Status: DC | PRN

## 2013-10-27 MED ORDER — MORPHINE SULFATE ER 30 MG PO TBCR
30.0000 mg | EXTENDED_RELEASE_TABLET | Freq: Two times a day (BID) | ORAL | Status: DC
  Administered 2013-10-27 – 2013-10-30 (×7): 30 mg via ORAL
  Filled 2013-10-27 (×7): qty 1

## 2013-10-27 MED ORDER — DIPHENHYDRAMINE HCL 50 MG/ML IJ SOLN
25.0000 mg | Freq: Once | INTRAMUSCULAR | Status: AC
  Administered 2013-10-27: 25 mg via INTRAVENOUS
  Filled 2013-10-27: qty 1

## 2013-10-27 MED ORDER — LISINOPRIL 10 MG PO TABS
10.0000 mg | ORAL_TABLET | Freq: Every day | ORAL | Status: DC
  Administered 2013-10-27 – 2013-10-30 (×4): 10 mg via ORAL
  Filled 2013-10-27 (×5): qty 1

## 2013-10-27 MED ORDER — HYDROXYUREA 500 MG PO CAPS
1000.0000 mg | ORAL_CAPSULE | Freq: Every day | ORAL | Status: DC
  Administered 2013-10-27 – 2013-10-28 (×2): 1000 mg via ORAL
  Filled 2013-10-27 (×3): qty 2

## 2013-10-27 MED ORDER — ONDANSETRON HCL 4 MG PO TABS: 4.0000 mg | ORAL_TABLET | Freq: Four times a day (QID) | ORAL | Status: DC | PRN

## 2013-10-27 MED ORDER — DIAZEPAM 5 MG/ML IJ SOLN
5.0000 mg | Freq: Once | INTRAMUSCULAR | Status: AC
  Administered 2013-10-27: 5 mg via INTRAVENOUS
  Filled 2013-10-27: qty 2

## 2013-10-27 MED ORDER — HYDROMORPHONE HCL 2 MG/ML IJ SOLN
2.0000 mg | Freq: Once | INTRAMUSCULAR | Status: AC
  Administered 2013-10-27: 2 mg via INTRAVENOUS
  Filled 2013-10-27: qty 1

## 2013-10-27 MED ORDER — HYDROMORPHONE HCL 2 MG/ML IJ SOLN
2.0000 mg | INTRAMUSCULAR | Status: DC | PRN
  Administered 2013-10-27 (×4): 2 mg via INTRAVENOUS
  Filled 2013-10-27 (×4): qty 1

## 2013-10-27 MED ORDER — POTASSIUM CHLORIDE CRYS ER 20 MEQ PO TBCR
20.0000 meq | EXTENDED_RELEASE_TABLET | Freq: Every morning | ORAL | Status: DC
  Administered 2013-10-27 – 2013-10-30 (×4): 20 meq via ORAL
  Filled 2013-10-27 (×5): qty 1

## 2013-10-27 MED ORDER — ZOLPIDEM TARTRATE 10 MG PO TABS
10.0000 mg | ORAL_TABLET | Freq: Every evening | ORAL | Status: DC | PRN
  Administered 2013-10-27 – 2013-10-30 (×3): 10 mg via ORAL
  Filled 2013-10-27 (×3): qty 1

## 2013-10-27 MED ORDER — ALUM & MAG HYDROXIDE-SIMETH 200-200-20 MG/5ML PO SUSP: 30.0000 mL | Freq: Four times a day (QID) | ORAL | Status: DC | PRN

## 2013-10-27 NOTE — ED Notes (Signed)
MD Yao at bedside 

## 2013-10-27 NOTE — ED Notes (Signed)
Pt in XRAY 

## 2013-10-27 NOTE — Progress Notes (Signed)
Pt states he was fooling around with brother and he fell on his RT hip in the road. CT scan done. Pt appears comfortable while talking on the phone. States he was using a cane at home because he is unable to put weight on his RT. hip

## 2013-10-27 NOTE — ED Notes (Addendum)
Attempted to call report to floor. Will call back.

## 2013-10-27 NOTE — ED Notes (Signed)
MD at bedside. 

## 2013-10-27 NOTE — H&P (Signed)
[facility] History and Physical  LYNDEN FLEMMER DXI:[id number] DOB: Jun 19, 1980 DOA: 10/27/2013  Referring physician:  PCP: [name],[name], MD   Chief Complaint: Right Hip Pain, S/p Fall.   HPI: [name] is a 33 y.o. male with a past medical history of sickle cell anemia, history of avascular necrosis of right hip, history of pulmonary embolism on Xarelto therapy, who presents to the emergency room with complaints of severe right hip pain. Over the past 6 months he has had multiple hospitalizations for sickle cell crisis. He was last seen in the emergency room yesterday as he presented with complaints of pain, improved with administration of IV narcotics. He was discharged in stable condition from the emergency room to followup with his PCP. Overnight at approximately 2:00 in the morning he reports being out with his brothers, slipped on the wet concrete and fell on his right side. He experienced immediate right hip pain, unable to bear weight. Right hip x-ray did not reveal acute bony abnormality, with right hip arthroplasty not showing complicating features. In the emergency room he was still unable to bear weight despite receiving several doses of IV Dilaudid. Medicine asked to admit patient for pain control.                                                                                                                                                                                                                                         Review of Systems:  Constitutional:  No weight loss, night sweats, Fevers, chills, fatigue.  HEENT:  No headaches, Difficulty swallowing,Tooth/dental problems,Sore throat,  No sneezing, itching, ear ache, nasal congestion, post nasal drip,  Cardio-vascular:  No chest pain, Orthopnea, PND, swelling in lower extremities, anasarca, dizziness, palpitations  GI:  No heartburn, indigestion, abdominal pain, nausea, vomiting, diarrhea, change  in bowel habits, loss of appetite  Resp:  No shortness of breath with exertion or at rest. No excess mucus, no productive cough, No non-productive cough, No coughing up of blood.No change in color of mucus.No wheezing.No chest wall deformity  Skin:  no rash or lesions.  GU:  no dysuria, change in color of urine, no urgency or frequency. No flank pain.  Musculoskeletal:  Positive for right hip pain and left-sided thoracic wall pain, with decreased range of motion to right lower extremity. No back pain.  Psych:  No change in mood or affect.  No depression or anxiety. No memory loss.   Past Medical History  Diagnosis Date  . Sickle cell anemia   . Blood transfusion   . 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 Hypokalemia   . Mood disorder   . History of pulmonary embolus (PE)   . Avascular necrosis   . Leukocytosis     Chronic  . Thrombocytosis     Chronic  . Hypertension   . History of Clostridium difficile infection   . Uses marijuana   . Chronic anticoagulation   . Functional asplenia   . Former smoker   . Second hand tobacco smoke exposure   . Alcohol consumption of one to four drinks per day    Past Surgical History  Procedure Laterality Date  . Right hip replacement      08/2006  . Cholecystectomy      01/2008  . Porta cath placement    . Porta cath removal    . Umbilical hernia repair      01/2008  . Excision of left periauricular cyst      10/2009  . Excision of right ear lobe cyst with primary closur      11/2007  . Portacath placement  01/05/2012    Procedure: INSERTION PORT-A-CATH;  Surgeon: Odis Hollingshead, MD;  Location: [place];  Service: General;  Laterality: N/A;  ultrasound guiced port a cath insertion with fluoroscopy   Social History:  reports that he quit smoking about 3 years ago. He has never used smokeless tobacco. He reports that he uses illicit drugs about twice per week. He reports that he does not drink  alcohol.  Allergies  Allergen Reactions  . Morphine And Related Hives and Rash    Pt states he can take PO, but not IV and he is able to tolerate Dilaudid with no reactions.    Family History  Problem Relation Age of Onset  . Sickle cell anemia Mother   . Sickle cell anemia Father   . Sickle cell trait Brother      Prior to Admission medications   Medication Sig Start Date End Date Taking? Authorizing Provider  folic acid (FOLVITE) 1 MG tablet Take 1 tablet (1 mg total) by mouth every morning. 10/08/13  Yes [name], MD  HYDROmorphone (DILAUDID) 4 MG tablet Take 1 tablet (4 mg total) by mouth every 4 (four) hours as needed for severe pain. 10/24/13  Yes Dorena Dew, FNP  hydroxyurea (HYDREA) 500 MG capsule Take 2 capsules (1,000 mg total) by mouth daily. May take with food to minimize GI side effects. 10/08/13  Yes [name], MD  lisinopril (PRINIVIL,ZESTRIL) 10 MG tablet Take 1 tablet (10 mg total) by mouth daily. 10/24/13  Yes Dorena Dew, FNP  metoprolol tartrate (LOPRESSOR) 25 MG tablet Take 1 tablet (25 mg total) by mouth daily. 08/22/13  Yes [name], MD  morphine (MS CONTIN) 30 MG 12 hr tablet Take 1 tablet (30 mg total) by mouth every 12 (twelve) hours. 09/26/13  Yes [name], MD  potassium chloride SA (K-DUR,KLOR-CON) 20 MEQ tablet Take 1 tablet (20 mEq total) by mouth every morning. 09/25/13  Yes [name], MD  rivaroxaban (XARELTO) 20 MG TABS tablet Take 1 tablet (20 mg total) by mouth every morning. 10/24/13  Yes Dorena Dew, FNP  zolpidem (AMBIEN) 10 MG tablet Take 1 tablet (10 mg total) by mouth at bedtime as needed  for sleep. 10/24/13  Yes [name], MD   Physical Exam: Filed Vitals:   10/27/13 0930 10/27/13 0940 10/27/13 1000 10/27/13 1020  BP:  111/72 122/78 117/77  Pulse: 91 101 100 95  Temp:      TempSrc:      Resp: 19 21 23 22   SpO2: 02% 94% 96% 96%    Wt Readings from Last 3 Encounters:   10/17/13 73.936 kg (163 lb)  10/07/13 74.118 kg (163 lb 6.4 oz)  09/22/13 74.707 kg (164 lb 11.2 oz)    General:  Appears calm , complains of ongoing right hip pain Eyes: PERRL, normal lids, irises & conjunctiva ENT: grossly normal hearing, lips & tongue Neck: no LAD, masses or thyromegaly Cardiovascular: RRR, no m/r/g. No LE edema. Telemetry: SR, no arrhythmias  Respiratory: CTA bilaterally, no w/r/r. Normal respiratory effort. Abdomen: soft, ntnd Skin: no rash or induration seen on limited exam Musculoskeletal: Patient having significant pain with passive and active movement to his right lower extremity. He had some pain to palpation over anterior aspect of hip, otherwise no deformities were noted.  Psychiatric: grossly normal mood and affect, speech fluent and appropriate Neurologic: grossly non-focal.          Labs on Admission:  Basic Metabolic Panel:  Recent Labs Lab 10/24/13 1430 10/26/13 1504 10/27/13 0751  NA 138 137 137  K 3.6* 3.6* 3.7  CL 102 103 101  CO2 22 21 21   GLUCOSE 92 94 117*  BUN 6 5* 7  CREATININE 0.58 0.59 0.71  CALCIUM 8.8 9.0 9.1   Liver Function Tests:  Recent Labs Lab 10/24/13 1430 10/26/13 1504  AST 43* 38*  ALT 19 24  ALKPHOS 78 79  BILITOT 7.8* 5.8*  PROT 7.6 7.6  ALBUMIN 4.2 4.2   No results found for this basename: LIPASE, AMYLASE,  in the last 168 hours No results found for this basename: AMMONIA,  in the last 168 hours CBC:  Recent Labs Lab 10/24/13 1430 10/26/13 1504 10/27/13 0751  WBC 13.9* 18.5* 18.4*  NEUTROABS 8.6* 13.7* 14.9*  HGB 7.3* 7.7* 7.6*  HCT 21.1* 22.4* 22.1*  MCV 93.0 94.1 93.6  PLT 513* 534* 540*   Cardiac Enzymes: No results found for this basename: CKTOTAL, CKMB, CKMBINDEX, TROPONINI,  in the last 168 hours  BNP (last 3 results)  Recent Labs  11/13/12 1826  PROBNP 106.3   CBG: No results found for this basename: GLUCAP,  in the last 168 hours  Radiological Exams on Admission: Dg  Chest 2 View  10/27/2013   CLINICAL DATA:  Chest pain and history of sickle cell anemia. Recent fall.  EXAM: CHEST  2 VIEW  COMPARISON:  Chest radiograph 10/07/2013  FINDINGS: There is a left subclavian Port-A-Cath. Catheter tip is in the lower SVC and stable. Negative for a pneumothorax. There are stable patchy linear densities which could represent areas of scarring. No evidence for focal airspace disease. Heart size is upper limits of normal. No pleural effusions.  IMPRESSION: Patchy parenchymal lung densities are most compatible with chronic changes. No acute airspace disease or edema.   Electronically Signed   By: Markus Daft M.D.   On: 10/27/2013 08:27   Dg Hip Complete Right  10/27/2013   CLINICAL DATA:  Sickle cell anemia. Fall and right groin pain. History of right hip replacement for avascular necrosis.  EXAM: RIGHT HIP - COMPLETE 2+ VIEW  COMPARISON:  11/13/2010  FINDINGS: Patient has a right hip arthroplasty. The hip  arthroplasty is located without complicating features. There is a small gap between the prosthesis and the femur intertrochanteric region but unclear if this represents an interval change. No significant lucency around the femoral stem. Prosthesis appears to be well seated in the femoral bone on the lateral view. Pelvic bony ring is intact. Stool in the rectal region.  IMPRESSION: No acute bone abnormality in the pelvis or right hip.  Right hip arthroplasty without complicating features.   Electronically Signed   By: Markus Daft M.D.   On: 10/27/2013 08:33    EKG: Independently reviewed.   Assessment/Plan Principal Problem:   Acute hip pain Active Problems:   Avascular necrosis   Hx of pulmonary embolus   Sickle cell anemia   Sickle cell anemia with pain   HTN (hypertension)   1. Acute right hip pain. Patient with history of avascular necrosis of right hip, undergoing right hip arthroplasty in the past, having a fall overnight as he slipped on wet concrete. Despite  receiving IV narcotic analgesics in the emergency department see reports ongoing severe pain symptoms, been unable to bear weight. Plain film of right hip show no acute bony abnormality. Will followup with CT scan of hip given physical exam findings. Meanwhile continue as needed IV Dilantin for pain management. 2. History of sickle cell anemia. He reports having some left-sided thoracic wall pain although two-view chest x-ray performed in the emergency department did not reveal acute airspace disease or edema. I think unlikely related to acute chest syndrome. Continue supportive care, IV fluids, narcotic analgesics.  3. History of pulmonary embolism. Will continue anticoagulation with Xarelto.  4. Hypertension. Will continue metoprolol 25 mg daily and lisinopril 10 mg daily. 5. DVT prophylaxis. Patient fully anticoagulated    Code Status: Full code Family Communication: Family not present in the emergency room Disposition Plan: Will place patient in overnight observation, do not anticipate him requiring greater than 2 night hospitalization  Time spent: 50 min  Kelvin Cellar [facility] Pager [phone]

## 2013-10-27 NOTE — ED Notes (Signed)
Pt was getting out of the car around 3am and slipped on wet ground and fell directly on right hip. Pt has pins and screws in that hip from previous fracture.

## 2013-10-27 NOTE — ED Provider Notes (Signed)
CSN: [id number]     Arrival date & time 10/27/13  1017 History   First MD Initiated Contact with Patient 10/27/13 [phone]     Chief Complaint  Patient presents with  . Hip Pain  . Fall     (Consider location/radiation/quality/duration/timing/severity/associated sxs/prior Treatment) The history is provided by the patient.  [name] is a 33 y.o. male hx of sickle cell anemia, avascular necrosis of hip, previous R hip surgery here with fall. He got the car around 3 AM and fell on the right hip. Denies any head injury or loss of consciousness. He had previous screws in the hip. Patient had some chest pain as well but denies injury to the chest. No previous acute chest before. Was seen here yesterday for leg pain but labs were at baseline and pain was controlled with dilaudid and patient was sent home. Denies fever or cough or abdominal pain.    Past Medical History  Diagnosis Date  . Sickle cell anemia   . Blood transfusion   . 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 Hypokalemia   . Mood disorder   . History of pulmonary embolus (PE)   . Avascular necrosis   . Leukocytosis     Chronic  . Thrombocytosis     Chronic  . Hypertension   . History of Clostridium difficile infection   . Uses marijuana   . Chronic anticoagulation   . Functional asplenia   . Former smoker   . Second hand tobacco smoke exposure   . Alcohol consumption of one to four drinks per day    Past Surgical History  Procedure Laterality Date  . Right hip replacement      08/2006  . Cholecystectomy      01/2008  . Porta cath placement    . Porta cath removal    . Umbilical hernia repair      01/2008  . Excision of left periauricular cyst      10/2009  . Excision of right ear lobe cyst with primary closur      11/2007  . Portacath placement  01/05/2012    Procedure: INSERTION PORT-A-CATH;  Surgeon: Odis Hollingshead, MD;  Location: [place];  Service: General;  Laterality: N/A;   ultrasound guiced port a cath insertion with fluoroscopy   Family History  Problem Relation Age of Onset  . Sickle cell anemia Mother   . Sickle cell anemia Father   . Sickle cell trait Brother    History  Substance Use Topics  . Smoking status: Former Smoker -- 13 years    Quit date: 07/08/2010  . Smokeless tobacco: Never Used  . Alcohol Use: No    Review of Systems  Cardiovascular: Positive for chest pain.  Musculoskeletal:       R hip pain   All other systems reviewed and are negative.     Allergies  Morphine and related  Home Medications   Prior to Admission medications   Medication Sig Start Date End Date Taking? Authorizing Provider  folic acid (FOLVITE) 1 MG tablet Take 1 tablet (1 mg total) by mouth every morning. 10/08/13  Yes [name], MD  HYDROmorphone (DILAUDID) 4 MG tablet Take 1 tablet (4 mg total) by mouth every 4 (four) hours as needed for severe pain. 10/24/13  Yes Dorena Dew, FNP  hydroxyurea (HYDREA) 500 MG capsule Take 2 capsules (1,000 mg total) by mouth daily. May take with food to  minimize GI side effects. 10/08/13  Yes [name], MD  lisinopril (PRINIVIL,ZESTRIL) 10 MG tablet Take 1 tablet (10 mg total) by mouth daily. 10/24/13  Yes Dorena Dew, FNP  metoprolol tartrate (LOPRESSOR) 25 MG tablet Take 1 tablet (25 mg total) by mouth daily. 08/22/13  Yes [name], MD  morphine (MS CONTIN) 30 MG 12 hr tablet Take 1 tablet (30 mg total) by mouth every 12 (twelve) hours. 09/26/13  Yes [name], MD  potassium chloride SA (K-DUR,KLOR-CON) 20 MEQ tablet Take 1 tablet (20 mEq total) by mouth every morning. 09/25/13  Yes [name], MD  rivaroxaban (XARELTO) 20 MG TABS tablet Take 1 tablet (20 mg total) by mouth every morning. 10/24/13  Yes Dorena Dew, FNP  zolpidem (AMBIEN) 10 MG tablet Take 1 tablet (10 mg total) by mouth at bedtime as needed for sleep. 10/24/13  Yes [name], MD   BP 111/72   Pulse 101  Temp(Src) 98 F (36.7 C) (Oral)  Resp 21  SpO2 94% Physical Exam  Nursing note and vitals reviewed. Constitutional: He is oriented to person, place, and time.  Uncomfortable   HENT:  Head: Normocephalic and atraumatic.  Mouth/Throat: Oropharynx is clear and moist.  Eyes: Conjunctivae and EOM are normal. Pupils are equal, round, and reactive to light.  Neck: Normal range of motion. Neck supple.  Cardiovascular: Normal rate, regular rhythm and normal heart sounds.   Pulmonary/Chest: Effort normal and breath sounds normal. No respiratory distress. He has no wheezes. He has no rales.  Abdominal: Soft. Bowel sounds are normal. He exhibits no distension. There is no tenderness. There is no rebound.  Musculoskeletal:  Dec ROM R hip. No obvious femoral or knee tenderness. No other obvious extremity trauma.   Neurological: He is alert and oriented to person, place, and time. No cranial nerve deficit. Coordination normal.  Skin: Skin is warm and dry.  Psychiatric: He has a normal mood and affect. His behavior is normal. Judgment and thought content normal.    ED Course  Procedures (including critical care time) Labs Review Labs Reviewed  CBC WITH DIFFERENTIAL - Abnormal; Notable for the following:    WBC 18.4 (*)    RBC 2.36 (*)    Hemoglobin 7.6 (*)    HCT 22.1 (*)    RDW 19.5 (*)    Platelets 540 (*)    Neutrophils Relative % 82 (*)    Neutro Abs 14.9 (*)    Lymphocytes Relative 9 (*)    Monocytes Absolute 1.7 (*)    All other components within normal limits  BASIC METABOLIC PANEL - Abnormal; Notable for the following:    Glucose, Bld 117 (*)    All other components within normal limits  RETICULOCYTES - Abnormal; Notable for the following:    Retic Ct Pct 17.2 (*)    RBC. 2.36 (*)    Retic Count, Manual 405.9 (*)    All other components within normal limits  I-STAT TROPOININ, ED    Imaging Review Dg Chest 2 View  10/27/2013   CLINICAL DATA:  Chest pain and  history of sickle cell anemia. Recent fall.  EXAM: CHEST  2 VIEW  COMPARISON:  Chest radiograph 10/07/2013  FINDINGS: There is a left subclavian Port-A-Cath. Catheter tip is in the lower SVC and stable. Negative for a pneumothorax. There are stable patchy linear densities which could represent areas of scarring. No evidence for focal airspace disease. Heart size is upper limits  of normal. No pleural effusions.  IMPRESSION: Patchy parenchymal lung densities are most compatible with chronic changes. No acute airspace disease or edema.   Electronically Signed   By: Markus Daft M.D.   On: 10/27/2013 08:27   Dg Hip Complete Right  10/27/2013   CLINICAL DATA:  Sickle cell anemia. Fall and right groin pain. History of right hip replacement for avascular necrosis.  EXAM: RIGHT HIP - COMPLETE 2+ VIEW  COMPARISON:  11/13/2010  FINDINGS: Patient has a right hip arthroplasty. The hip arthroplasty is located without complicating features. There is a small gap between the prosthesis and the femur intertrochanteric region but unclear if this represents an interval change. No significant lucency around the femoral stem. Prosthesis appears to be well seated in the femoral bone on the lateral view. Pelvic bony ring is intact. Stool in the rectal region.  IMPRESSION: No acute bone abnormality in the pelvis or right hip.  Right hip arthroplasty without complicating features.   Electronically Signed   By: Markus Daft M.D.   On: 10/27/2013 08:33     EKG Interpretation   Date/Time:  Sunday October 27 2013 08:26:00 EDT Ventricular Rate:  82 PR Interval:  177 QRS Duration: 111 QT Interval:  398 QTC Calculation: 465 R Axis:   -68 Text Interpretation:  Sinus tachycardia Multiform ventricular premature  complexes Probable left atrial enlargement Left anterior fascicular block  Abnormal T, consider ischemia, anterior leads Baseline wander in lead(s)  V1 No significant change since last tracing Confirmed by YAO  MD, DAVID   [phone]) on 10/27/2013 8:27:52 AM      MDM   Final diagnoses:  None   Emi Holes is a 33 y.o. male here with R hip pain s/p fall. Consider fracture with possible sickle cell crisis. Will get labs, reticulocyte count, hip xray.   9:55 AM Still in pain after 2 doses of pain meds. xrays showed no fracture. Labs at baseline. Will admit to med/surg for pain control.     Wandra Arthurs, MD 10/27/13 [phone]

## 2013-10-27 NOTE — ED Provider Notes (Signed)
Medical screening examination/treatment/procedure(s) were performed by non-physician practitioner and as supervising physician I was immediately available for consultation/collaboration.   EKG Interpretation None        Orpah Greek, MD 10/27/13 [phone]

## 2013-10-27 NOTE — ED Notes (Addendum)
Pt arguing with someone on the phone.

## 2013-10-28 DIAGNOSIS — D5700 Hb-SS disease with crisis, unspecified: Secondary | ICD-10-CM | POA: Diagnosis not present

## 2013-10-28 LAB — CBC
HEMATOCRIT: 21.1 % — AB (ref 39.0–52.0)
HEMOGLOBIN: 7.3 g/dL — AB (ref 13.0–17.0)
MCH: 32 pg (ref 26.0–34.0)
MCHC: 34.6 g/dL (ref 30.0–36.0)
MCV: 92.5 fL (ref 78.0–100.0)
Platelets: 492 10*3/uL — ABNORMAL HIGH (ref 150–400)
RBC: 2.28 MIL/uL — ABNORMAL LOW (ref 4.22–5.81)
RDW: 19.2 % — AB (ref 11.5–15.5)
WBC: 22.2 10*3/uL — ABNORMAL HIGH (ref 4.0–10.5)

## 2013-10-28 LAB — BASIC METABOLIC PANEL
Anion gap: 12 (ref 5–15)
BUN: 5 mg/dL — AB (ref 6–23)
CALCIUM: 8.7 mg/dL (ref 8.4–10.5)
CHLORIDE: 103 meq/L (ref 96–112)
CO2: 20 meq/L (ref 19–32)
CREATININE: 0.56 mg/dL (ref 0.50–1.35)
GFR calc Af Amer: >90 mL/min (ref >90)
GFR calc non Af Amer: >90 mL/min (ref >90)
GLUCOSE: 129 mg/dL — AB (ref 70–99)
Potassium: 3.7 mEq/L (ref 3.7–5.3)
Sodium: 135 mEq/L — ABNORMAL LOW (ref 137–147)

## 2013-10-28 LAB — MRSA PCR SCREENING: MRSA by PCR: NEGATIVE

## 2013-10-28 LAB — RETICULOCYTES
RBC.: 2.25 MIL/uL — ABNORMAL LOW (ref 4.22–5.81)
Retic Count, Absolute: 346.5 10*3/uL — ABNORMAL HIGH (ref 19.0–186.0)
Retic Ct Pct: 15.4 % — ABNORMAL HIGH (ref 0.4–3.1)

## 2013-10-28 LAB — LACTATE DEHYDROGENASE: LDH: 589 U/L — ABNORMAL HIGH (ref 94–250)

## 2013-10-28 MED ORDER — NALOXONE HCL 0.4 MG/ML IJ SOLN: 0.4000 mg | INTRAMUSCULAR | Status: DC | PRN

## 2013-10-28 MED ORDER — KETOROLAC TROMETHAMINE 15 MG/ML IJ SOLN
15.0000 mg | Freq: Four times a day (QID) | INTRAMUSCULAR | Status: DC
  Administered 2013-10-29 – 2013-10-30 (×6): 15 mg via INTRAVENOUS
  Filled 2013-10-28 (×12): qty 1

## 2013-10-28 MED ORDER — HYDROMORPHONE 2 MG/ML HIGH CONCENTRATION IV PCA SOLN
INTRAVENOUS | Status: DC
  Administered 2013-10-28: 6 mg via INTRAVENOUS
  Administered 2013-10-28: 11 mg via INTRAVENOUS
  Administered 2013-10-28: 25 mL via INTRAVENOUS
  Administered 2013-10-29: 1.5 mg via INTRAVENOUS
  Administered 2013-10-29: 3 mg via INTRAVENOUS
  Administered 2013-10-29: 7 mg via INTRAVENOUS
  Administered 2013-10-29: 3.5 mg via INTRAVENOUS
  Administered 2013-10-29: 3 mg via INTRAVENOUS
  Administered 2013-10-30: 5 mg via INTRAVENOUS
  Administered 2013-10-30: 4 mg via INTRAVENOUS
  Administered 2013-10-30: 5 mg via INTRAVENOUS
  Administered 2013-10-30: 7 mg via INTRAVENOUS
  Administered 2013-10-30: 6 mg via INTRAVENOUS
  Filled 2013-10-28 (×2): qty 25

## 2013-10-28 MED ORDER — SENNOSIDES-DOCUSATE SODIUM 8.6-50 MG PO TABS
1.0000 | ORAL_TABLET | Freq: Every day | ORAL | Status: DC
  Administered 2013-10-28 – 2013-10-29 (×2): 1 via ORAL
  Filled 2013-10-28 (×4): qty 1

## 2013-10-28 MED ORDER — ZOLPIDEM TARTRATE 10 MG PO TABS: 10.0000 mg | ORAL_TABLET | Freq: Every evening | ORAL | Status: DC | PRN

## 2013-10-28 MED ORDER — DEXTROSE-NACL 5-0.45 % IV SOLN
INTRAVENOUS | Status: DC
Start: 2013-10-28 — End: 2013-10-30
  Administered 2013-10-28: 1000 mL via INTRAVENOUS
  Administered 2013-10-29 – 2013-10-30 (×3): via INTRAVENOUS

## 2013-10-28 MED ORDER — SODIUM CHLORIDE 0.9 % IJ SOLN: 9.0000 mL | INTRAMUSCULAR | Status: DC | PRN

## 2013-10-28 NOTE — Telephone Encounter (Signed)
Meds ordered this encounter  Medications  . zolpidem (AMBIEN) 10 MG tablet    Sig: Take 1 tablet (10 mg total) by mouth at bedtime as needed for sleep.    Dispense:  30 tablet    Refill:  3    Order Specific Question:  Supervising Provider    Answer:  Liston Alba A [[id number]]  Reviewed [place] Substance Reporting system prior to reorder Dorena Dew, FNP

## 2013-10-28 NOTE — H&P (Signed)
Patient is here with a flare of his sickle cell pain. Will need to get IV dilaudid, toradol as well as supportive care. Agree with notes above by NP

## 2013-10-28 NOTE — Discharge Summary (Signed)
Patient admitted with sickle cell painful crisis. Agree with notes above by NP

## 2013-10-28 NOTE — Progress Notes (Signed)
Patient ID: [name], male   DOB: 1980/03/23, 33 y.o.   MRN: [id number]  SICKLE CELL SERVICE PROGRESS NOTE  SYD NEWSOME WIO:[id number] DOB: 1980/03/10 DOA: 10/27/2013 PCP: [name],[name], MD   Presenting HPI: [name] is a 33 y.o. male with a past medical history of sickle cell anemia, history of avascular necrosis of right hip, history of pulmonary embolism on Xarelto therapy, who presents to the emergency room with complaints of severe right hip pain. Over the past 6 months he has had multiple hospitalizations for sickle cell crisis. He was last seen in the emergency room yesterday as he presented with complaints of pain, improved with administration of IV narcotics. He was discharged in stable condition from the emergency room to followup with his PCP. Overnight at approximately 2:00 in the morning he reports being out with his brothers, slipped on the wet concrete and fell on his right side. He experienced immediate right hip pain, unable to bear weight. Right hip x-ray did not reveal acute bony abnormality, with right hip arthroplasty not showing complicating features. In the emergency room he was still unable to bear weight despite receiving several doses of IV Dilaudid. Medicine asked to admit patient for pain control.     Consultants:  None  Procedures:  None  Antibiotics:  none  HPI/Subjective: Pt states that his pain started after he fell on his right hip yesterday. His hip pain is better but he now feels like that threw him into a crisis. He reports pain along both sides of his chest that feels like his crisis pain. Pain is now a 7-8/10. He denies any dyspnea, SOB, or cough. CXR done yesterday showed no acute process. He is eating well and bowel/bladder habits are normal.   Objective: Filed Vitals:   10/28/13 1010 10/28/13 1137 10/28/13 1149 10/28/13 1207  BP: 134/83 128/96 128/96   Pulse: 105  96   Temp: 98.6 F (37 C)     TempSrc: Oral      Resp: 16   19  Height:      Weight:      SpO2: 94%   99%   Weight change:   Intake/Output Summary (Last 24 hours) at 10/28/13 1300 Last data filed at 10/28/13 1010  Gross per 24 hour  Intake 1088.67 ml  Output    500 ml  Net 588.67 ml    General: Alert, awake, oriented x3, in no acute distress.  HEENT: [place]/AT PEERL, EOMI Neck: Trachea midline,  no masses, no JVD, no carotid bruit, no LAD OROPHARYNX:  Moist, No exudate/ erythema/lesions.  Heart: Regular rate and rhythm, without murmurs, rubs, gallops Lungs: Clear to auscultation, no wheezing or rhonchi noted.  Abdomen: Soft, nontender, nondistended, positive bowel sounds, no masses no hepatosplenomegaly noted. Neuro: No focal neurological deficits noted cranial nerves II through XII grossly intact. DTRs 2+ bilaterally upper and lower extremities. Strength 5 out of 5 in bilateral upper and lower extremities. Musculoskeletal: No warm swelling or erythema around joints, no spinal tenderness noted. Psychiatric: Patient alert and oriented x3, good insight and cognition, good recent to remote recall. Lymph node survey: No cervical axillary or inguinal lymphadenopathy noted.   Data Reviewed: Basic Metabolic Panel:  Recent Labs Lab 10/24/13 1430 10/26/13 1504 10/27/13 0751 10/28/13 0613  NA 138 137 137 135*  K 3.6* 3.6* 3.7 3.7  CL 102 103 101 103  CO2 22 21 21 20   GLUCOSE 92 94 117* 129*  BUN 6 5* 7 5*  CREATININE 0.58 0.59 0.71 0.56  CALCIUM 8.8 9.0 9.1 8.7   Liver Function Tests:  Recent Labs Lab 10/24/13 1430 10/26/13 1504  AST 43* 38*  ALT 19 24  ALKPHOS 78 79  BILITOT 7.8* 5.8*  PROT 7.6 7.6  ALBUMIN 4.2 4.2   CBC:  Recent Labs Lab 10/24/13 1430 10/26/13 1504 10/27/13 0751 10/28/13 0613  WBC 13.9* 18.5* 18.4* 22.2*  NEUTROABS 8.6* 13.7* 14.9*  --   HGB 7.3* 7.7* 7.6* 7.3*  HCT 21.1* 22.4* 22.1* 21.1*  MCV 93.0 94.1 93.6 92.5  PLT 513* 534* 540* 492*    Recent Labs  11/13/12 1826  PROBNP  106.3   Studies: Dg Chest 2 View  09/22/2013   CLINICAL DATA:  Chest pain.  EXAM: CHEST  2 VIEW  COMPARISON:  August 28, 2013.  FINDINGS: Stable cardiomediastinal silhouette. Left subclavian Port-A-Cath is noted with distal tip in expected position of the SVC. No pneumothorax is noted. Stable interstitial densities are noted throughout both lungs most consistent with scarring. No acute pulmonary disease is noted. Bony thorax is intact.  IMPRESSION: No acute cardiopulmonary abnormality seen.   Electronically Signed   By: Sabino Dick M.D.   On: 09/22/2013 07:24   Dg Chest 2 View  08/28/2013   CLINICAL DATA:  Shortness of breath.  History of sickle cell anemia.  EXAM: CHEST  2 VIEW  COMPARISON:  CT 08/26/2013.  Radiographs 08/25/2013.  FINDINGS: The left subclavian Port-A-Cath tip is unchanged at the SVC right atrial junction. There is stable cardiomegaly. Generalized accentuation of the interstitial markings and patchy bibasilar opacities are unchanged. There is no progressive consolidation or significant pleural effusion. There are stable endplate changes within the thoracic spine consistent with sickle cell anemia.  IMPRESSION: Radiographically stable chest with cardiomegaly, accentuated interstitial markings and patchy bibasilar pulmonary opacities. No new findings demonstrated.   Electronically Signed   By: Camie Patience M.D.   On: 08/28/2013 14:12   Dg Chest 2 View  08/25/2013   CLINICAL DATA:  Sickle cell crisis.  Shortness of breath.  EXAM: CHEST  2 VIEW  COMPARISON:  08/13/2013 and 07/29/2013  FINDINGS: Power port in place. Chronic cardiomegaly. Pulmonary vascularity is within normal limits. There is slight chronic accentuation of the interstitial markings. No effusions. No acute osseous abnormality.  IMPRESSION: No acute findings. Chronic cardiomegaly. Chronic slight accentuation of the interstitial markings.   Electronically Signed   By: Rozetta Nunnery M.D.   On: 08/25/2013 23:55   Ct Angio Chest Pe  W/cm &/or Wo Cm  08/26/2013   CLINICAL DATA:  Bilateral rib pain, worse on the left. History of sickle cell. White cell count 16.6.  EXAM: CT ANGIOGRAPHY CHEST WITH CONTRAST  TECHNIQUE: Multidetector CT imaging of the chest was performed using the standard protocol during bolus administration of intravenous contrast. Multiplanar CT image reconstructions and MIPs were obtained to evaluate the vascular anatomy.  CONTRAST:  140mL OMNIPAQUE IOHEXOL 350 MG/ML SOLN  COMPARISON:  06/17/2013  FINDINGS: Left central venous catheter remains unchanged in position. Technically adequate study with moderately good opacification of central and proximal segmental pulmonary arteries. Distal segmental vessels are not well demonstrated due to motion artifact. There is no focal filling defect in the visualized pulmonary arteries. No significant pulmonary embolus is suggested.  Cardiac enlargement. Normal caliber thoracic aorta. Moderately prominent lymph nodes in the anterior mediastinum without change since prior study. Esophagus is decompressed. Evaluation of lungs is limited due to respiratory motion artifact but there appears to  be patchy infiltration in the lung periphery bilaterally, more prominent in the bases. Scattered nodular changes are demonstrated similar previous study. Infiltrative changes are some of the prior study and may represent infiltration due to pneumonia, atelectasis, or acute chest syndrome. No pleural effusion or pneumothorax. Diffusely increased density and atrophy of the spleen consistent with sickle cell related changes. Thoracic vertebral changes consistent with sickle cell.  Review of the MIP images confirms the above findings.  IMPRESSION: No evidence of significant pulmonary embolus. Evaluation of lungs is limited due to respiratory motion artifact but there appears to be patchy peripheral infiltrative changes bilaterally, similar to prior study. Cardiac enlargement and prominent anterior mediastinal  lymph nodes are also unchanged. Changes consistent with sickle cell effect in the spleen and spine.   Electronically Signed   By: Lucienne Capers M.D.   On: 08/26/2013 02:34    Scheduled Meds: . folic acid  1 mg Oral q morning - 10a  . HYDROmorphone PCA 2 mg/mL   Intravenous 6 times per day  . hydroxyurea  1,000 mg Oral Daily  . ketorolac  15 mg Intravenous 4 times per day  . lisinopril  10 mg Oral Daily  . metoprolol tartrate  25 mg Oral Daily  . morphine  30 mg Oral Q12H  . potassium chloride SA  20 mEq Oral q morning - 10a  . rivaroxaban  20 mg Oral q morning - 10a   Continuous Infusions: . dextrose 5 % and 0.45% NaCl 1,000 mL (10/28/13 1205)    Principal Problem:   Acute hip pain Active Problems:   Avascular necrosis   Hx of pulmonary embolus   Sickle cell anemia   Sickle cell anemia with pain   HTN (hypertension)   Assessment/Plan: Principal Problem:   Acute hip pain Active Problems:   Avascular necrosis   Hx of pulmonary embolus   Sickle cell anemia   Sickle cell anemia with pain   HTN (hypertension)  1. Sickle Cell Anemia with crisis: Start Dilaudid PCA 0.5mg  q10 min and Toradol 15mg  q6h.  Continue MS contin for long acting and PO Benadryl for itch. Stop IV push Dilaudid 2. Anemia: Hgb of 7.3, at baseline. Monitor. 3. Sickle Cell Care: Continue Folic Acid and Hydroxyurea  4. H/o PE: Continue Rivaroxaban 5. HTN: Well controlled. Continue home Lisinopril and Metoprolol    6. FEN/GI :0.9% NS stopped and D5/0.45% NS started at 75 ml/hr. Continue KDur. Regular Diet  Bowel regimen in place  Code Status: Full code DVT Prophylaxis: on Xarelto Family Communication: none Disposition Plan: home once pt is able to manage pain at home  Kalman Shan  Pager [phone]. If 7PM-7AM, please contact night-coverage.  10/28/2013, 1:00 PM  LOS: 1 day   Kalman Shan

## 2013-10-29 DIAGNOSIS — D72829 Elevated white blood cell count, unspecified: Secondary | ICD-10-CM

## 2013-10-29 LAB — CBC WITH DIFFERENTIAL/PLATELET
BASOS ABS: 0 10*3/uL (ref 0.0–0.1)
BASOS PCT: 0 % (ref 0–1)
EOS PCT: 3 % (ref 0–5)
Eosinophils Absolute: 0.6 10*3/uL (ref 0.0–0.7)
HCT: 18.9 % — ABNORMAL LOW (ref 39.0–52.0)
HEMOGLOBIN: 6.8 g/dL — AB (ref 13.0–17.0)
LYMPHS PCT: 14 % (ref 12–46)
Lymphs Abs: 3 10*3/uL (ref 0.7–4.0)
MCH: 33.8 pg (ref 26.0–34.0)
MCHC: 36 g/dL (ref 30.0–36.0)
MCV: 94 fL (ref 78.0–100.0)
MONOS PCT: 13 % — AB (ref 3–12)
Monocytes Absolute: 2.7 10*3/uL — ABNORMAL HIGH (ref 0.1–1.0)
NEUTROS PCT: 70 % (ref 43–77)
Neutro Abs: 14.8 10*3/uL — ABNORMAL HIGH (ref 1.7–7.7)
Platelets: 392 10*3/uL (ref 150–400)
RBC: 2.01 MIL/uL — AB (ref 4.22–5.81)
RDW: 19.2 % — ABNORMAL HIGH (ref 11.5–15.5)
WBC: 21.1 10*3/uL — AB (ref 4.0–10.5)
nRBC: 1 /100 WBC — ABNORMAL HIGH

## 2013-10-29 LAB — BASIC METABOLIC PANEL
Anion gap: 13 (ref 5–15)
BUN: 9 mg/dL (ref 6–23)
CO2: 21 meq/L (ref 19–32)
Calcium: 8.6 mg/dL (ref 8.4–10.5)
Chloride: 103 mEq/L (ref 96–112)
Creatinine, Ser: 0.6 mg/dL (ref 0.50–1.35)
GFR calc non Af Amer: >90 mL/min (ref >90)
GLUCOSE: 110 mg/dL — AB (ref 70–99)
POTASSIUM: 4 meq/L (ref 3.7–5.3)
Sodium: 137 mEq/L (ref 137–147)

## 2013-10-29 LAB — LACTATE DEHYDROGENASE: LDH: 556 U/L — ABNORMAL HIGH (ref 94–250)

## 2013-10-29 LAB — RETICULOCYTES
RBC.: 2.01 MIL/uL — ABNORMAL LOW (ref 4.22–5.81)
Retic Count, Absolute: 285.4 10*3/uL — ABNORMAL HIGH (ref 19.0–186.0)
Retic Ct Pct: 14.2 % — ABNORMAL HIGH (ref 0.4–3.1)

## 2013-10-29 MED ORDER — HYDROMORPHONE HCL 4 MG PO TABS
4.0000 mg | ORAL_TABLET | ORAL | Status: DC
  Administered 2013-10-29 – 2013-10-30 (×8): 4 mg via ORAL
  Filled 2013-10-29 (×7): qty 1

## 2013-10-29 MED ORDER — HYDROXYUREA 500 MG PO CAPS
1500.0000 mg | ORAL_CAPSULE | Freq: Every day | ORAL | Status: DC
  Administered 2013-10-30: 1500 mg via ORAL
  Filled 2013-10-29: qty 3

## 2013-10-29 MED ORDER — POLYETHYLENE GLYCOL 3350 17 G PO PACK
17.0000 g | PACK | Freq: Every day | ORAL | Status: DC | PRN
  Filled 2013-10-29: qty 1

## 2013-10-29 NOTE — Progress Notes (Signed)
SICKLE CELL SERVICE PROGRESS NOTE  TRACE WIRICK NFA:[id number] DOB: 10-13-1980 DOA: 10/27/2013 PCP: [name],[name], MD  Assessment/Plan: Principal Problem:   Acute hip pain Active Problems:   Avascular necrosis   Hx of pulmonary embolus   Sickle cell anemia   Sickle cell anemia with pain   HTN (hypertension)  1. Hb ss with crisis:   He has used only 28 mg with 48/43 demands/deliveries. Will schedule oral dilaudid and continue PCA as a PRN and continue MS Contin and Toradol.  2. Leukocytosis: Pt has no signs of infection.He reports compliance with Hydrea, however his indices do not reflect compliance and would not likely allow such a high WBC count if compliant. No evidence of infection. Will continue to monitor. 3. Hemolysis: Pt has an increase in LDH from baseline which is in the 300's. It likely represents an acute hemolysis related to crisis. 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  Code Status: Full Code Family Communication: N/A Disposition Plan: Not yet ready for discharge  [name].  Pager [phone]. If 7PM-7AM, please contact night-coverage.  10/29/2013, 9:36 AM  LOS: 2 days   Initial H&P:  [name] is a 33 y.o. male with a past medical history of sickle cell anemia, history of avascular necrosis of right hip, history of pulmonary embolism on Xarelto therapy, who presents to the emergency room with complaints of severe right hip pain. Over the past 6 months he has had multiple hospitalizations for sickle cell crisis. He was last seen in the emergency room yesterday as he presented with complaints of pain, improved with administration of IV narcotics. He was discharged in stable condition from the emergency room to followup with his PCP. Overnight at approximately 2:00 in the morning he reports being out with his brothers,  slipped on the wet concrete and fell on his right side. He experienced immediate right hip pain, unable to bear weight. Right hip x-ray did not reveal acute bony abnormality, with right hip arthroplasty not showing complicating features. In the emergency room he was still unable to bear weight despite receiving several doses of IV Dilaudid. Medicine asked to admit patient for pain control.    Consultants:  None  Procedures:  None  Antibiotics:  None  HPI/Subjective: Pt rates pain as 6/10 localized to back and legs. He reports aht the hip pain is improved.  Objective: Filed Vitals:   10/29/13 0218 10/29/13 0417 10/29/13 0610 10/29/13 0800  BP: 112/82  118/77   Pulse: 91  75   Temp: 99.2 F (37.3 C)  98.4 F (36.9 C)   TempSrc: Oral  Oral   Resp: 14 18  16   Height:      Weight:   164 lb (74.39 kg)   SpO2: 95% 100% 98% 98%   Weight change: 1 lb (0.454 kg)  Intake/Output Summary (Last 24 hours) at 10/29/13 0936 Last data filed at 10/29/13 0533  Gross per 24 hour  Intake   2028 ml  Output   1875 ml  Net    153 ml    General: Alert, awake, oriented x3, in no acute distress.  HEENT: [place]/AT PEERL, EOMI, mild icterus Neck: Trachea midline,  no masses, no thyromegal,y no JVD, no carotid bruit OROPHARYNX:  Moist, No exudate/ erythema/lesions.  Heart: Regular rate and rhythm, without murmurs, rubs, gallops.  Lungs: Clear to auscultation, no wheezing or rhonchi noted.  Abdomen: Soft, nontender, nondistended, positive bowel sounds, no masses no hepatosplenomegaly noted.  Neuro: No focal neurological deficits noted cranial nerves II through XII grossly intact. Strength normal in bilateral upper and lower extremities. Musculoskeletal: No warm swelling or erythema around joints, no spinal tenderness noted. Psychiatric: Patient alert and oriented x3, good insight and cognition, good recent to remote recall. Lymph node survey: No cervical axillary or inguinal lymphadenopathy  noted.   Data Reviewed: Basic Metabolic Panel:  Recent Labs Lab 10/24/13 1430 10/26/13 1504 10/27/13 0751 10/28/13 0613 10/29/13 0525  NA 138 137 137 135* 137  K 3.6* 3.6* 3.7 3.7 4.0  CL 102 103 101 103 103  CO2 22 21 21 20 21   GLUCOSE 92 94 117* 129* 110*  BUN 6 5* 7 5* 9  CREATININE 0.58 0.59 0.71 0.56 0.60  CALCIUM 8.8 9.0 9.1 8.7 8.6   Liver Function Tests:  Recent Labs Lab 10/24/13 1430 10/26/13 1504  AST 43* 38*  ALT 19 24  ALKPHOS 78 79  BILITOT 7.8* 5.8*  PROT 7.6 7.6  ALBUMIN 4.2 4.2   No results found for this basename: LIPASE, AMYLASE,  in the last 168 hours No results found for this basename: AMMONIA,  in the last 168 hours CBC:  Recent Labs Lab 10/24/13 1430 10/26/13 1504 10/27/13 0751 10/28/13 0613 10/29/13 0525  WBC 13.9* 18.5* 18.4* 22.2* 21.1*  NEUTROABS 8.6* 13.7* 14.9*  --  14.8*  HGB 7.3* 7.7* 7.6* 7.3* 6.8*  HCT 21.1* 22.4* 22.1* 21.1* 18.9*  MCV 93.0 94.1 93.6 92.5 94.0  PLT 513* 534* 540* 492* 392   Cardiac Enzymes: No results found for this basename: CKTOTAL, CKMB, CKMBINDEX, TROPONINI,  in the last 168 hours BNP (last 3 results)  Recent Labs  11/13/12 1826  PROBNP 106.3   CBG: No results found for this basename: GLUCAP,  in the last 168 hours  Recent Results (from the past 240 hour(s))  MRSA PCR SCREENING     Status: None   Collection Time    10/28/13  1:59 AM      Result Value Ref Range Status   MRSA by PCR NEGATIVE  NEGATIVE Final   Comment:            The GeneXpert MRSA Assay (FDA     approved for NASAL specimens     only), is one component of a     comprehensive MRSA colonization     surveillance program. It is not     intended to diagnose MRSA     infection nor to guide or     monitor treatment for     MRSA infections.     Studies: Dg Chest 2 View  10/27/2013   CLINICAL DATA:  Chest pain and history of sickle cell anemia. Recent fall.  EXAM: CHEST  2 VIEW  COMPARISON:  Chest radiograph 10/07/2013   FINDINGS: There is a left subclavian Port-A-Cath. Catheter tip is in the lower SVC and stable. Negative for a pneumothorax. There are stable patchy linear densities which could represent areas of scarring. No evidence for focal airspace disease. Heart size is upper limits of normal. No pleural effusions.  IMPRESSION: Patchy parenchymal lung densities are most compatible with chronic changes. No acute airspace disease or edema.   Electronically Signed   By: Markus Daft M.D.   On: 10/27/2013 08:27   Dg Chest 2 View  10/07/2013   CLINICAL DATA:  Sickle-cell crisis.  EXAM: CHEST  2 VIEW  COMPARISON:  10/05/2013  FINDINGS: Left subclavian Port-A-Cath is unchanged. Lungs are somewhat hypoinflated with prominence of the perihilar markings. Mild stable cardiomegaly. Remainder the exam is unchanged.  IMPRESSION: Mild prominence of the perihilar markings which may be due to mild vascular congestion versus viral bronchopneumonia.  Stable cardiomegaly.   Electronically Signed   By: Marin Olp M.D.   On: 10/07/2013 13:49   Dg Chest 2 View  10/05/2013   CLINICAL DATA:  Sickle cell anemia crisis and back pain.  EXAM: CHEST - 2 VIEW  COMPARISON:  09/22/2013  FINDINGS: Port-A-Cath positioning is stable. Stable appearance of scattered areas of pulmonary scarring bilaterally. There is no evidence of pulmonary edema, consolidation, pneumothorax, nodule or pleural fluid. The heart size is stable and at the upper limits of normal. The visualized spine shows no fractures or significant degenerative disease.  IMPRESSION: No active disease.  Stable pulmonary scarring.   Electronically Signed   By: Aletta Edouard M.D.   On: 10/05/2013 08:16   Dg Hip Complete Right  10/27/2013   CLINICAL DATA:  Sickle cell anemia. Fall and right groin pain. History of right hip replacement for avascular necrosis.  EXAM: RIGHT HIP - COMPLETE 2+ VIEW  COMPARISON:  11/13/2010  FINDINGS: Patient has a right hip arthroplasty. The hip arthroplasty is  located without complicating features. There is a small gap between the prosthesis and the femur intertrochanteric region but unclear if this represents an interval change. No significant lucency around the femoral stem. Prosthesis appears to be well seated in the femoral bone on the lateral view. Pelvic bony ring is intact. Stool in the rectal region.  IMPRESSION: No acute bone abnormality in the pelvis or right hip.  Right hip arthroplasty without complicating features.   Electronically Signed   By: Markus Daft M.D.   On: 10/27/2013 08:33   Ct Hip Right Wo Contrast  10/27/2013   CLINICAL DATA:  RIGHT hip pain post fall, history sickle cell disease, prior avascular necrosis RIGHT hip, RIGHT hip replacement  EXAM: CT OF THE RIGHT HIP WITHOUT CONTRAST  TECHNIQUE: Multidetector CT imaging of the right hip was performed according to the standard protocol. Multiplanar CT image reconstructions were also generated.  COMPARISON:  RIGHT hip radiographs 10/27/2013  FINDINGS: Severe artifacts secondary to metallic components of RIGHT hip prosthesis.  Visualized pelvis grossly intact.  No definite acute fracture of dislocation seen within limitations of metallic artifacts.  Bones appear demineralized.  No significant periprosthetic lucency.  Nutrient foramen seen at the posterior aspect of the femoral diaphysis at the level of the distal portion of the femoral component.  No definite acute osseous abnormalities identified.  Plate and screws seen at the posterior column RIGHT acetabular post ORIF.  Intrapelvic soft tissues unremarkable.  IMPRESSION: RIGHT hip prosthesis.  No definite acute osseous findings.   Electronically Signed   By: Lavonia Dana M.D.   On: 10/27/2013 16:27    Scheduled Meds: . folic acid  1 mg Oral q morning - 10a  . HYDROmorphone PCA 2 mg/mL   Intravenous 6 times per day  . hydroxyurea  1,000 mg Oral Daily  . ketorolac  15 mg Intravenous 4 times per day  . lisinopril  10 mg Oral Daily  .  metoprolol tartrate  25 mg Oral Daily  . morphine  30 mg Oral Q12H  . potassium chloride SA  20 mEq Oral q morning - 10a  . rivaroxaban  20 mg Oral q morning - 10a  . senna-docusate  1 tablet Oral QHS   Continuous Infusions: . dextrose 5 % and 0.45% NaCl 75 mL/hr at 10/29/13 0533    Total time 40 minutes.

## 2013-10-29 NOTE — Progress Notes (Signed)
CRITICAL VALUE ALERT  Critical value received:  hgb 6.8  Date of notification:  10/29/2013  Time of notification:  0618  Critical value read back:Yes.    Nurse who received alert:  Melba Coon  MD notified (1st page):  [name] NP  Time of first page:  [phone]  MD notified (2nd page):  Time of second page:  Responding MD:  K.Schorr NP  Time MD responded:  [phone]

## 2013-10-30 ENCOUNTER — Inpatient Hospital Stay (HOSPITAL_COMMUNITY): Payer: Medicare Other

## 2013-10-30 LAB — CBC WITH DIFFERENTIAL/PLATELET
Basophils Absolute: 0.2 10*3/uL — ABNORMAL HIGH (ref 0.0–0.1)
Basophils Relative: 1 % (ref 0–1)
Eosinophils Absolute: 0.5 10*3/uL (ref 0.0–0.7)
Eosinophils Relative: 3 % (ref 0–5)
HEMATOCRIT: 18.1 % — AB (ref 39.0–52.0)
Hemoglobin: 6.4 g/dL — CL (ref 13.0–17.0)
LYMPHS ABS: 3.1 10*3/uL (ref 0.7–4.0)
Lymphocytes Relative: 17 % (ref 12–46)
MCH: 32.2 pg (ref 26.0–34.0)
MCHC: 35.4 g/dL (ref 30.0–36.0)
MCV: 91 fL (ref 78.0–100.0)
Monocytes Absolute: 2.3 10*3/uL — ABNORMAL HIGH (ref 0.1–1.0)
Monocytes Relative: 13 % — ABNORMAL HIGH (ref 3–12)
Neutro Abs: 11.9 10*3/uL — ABNORMAL HIGH (ref 1.7–7.7)
Neutrophils Relative %: 66 % (ref 43–77)
PLATELETS: 437 10*3/uL — AB (ref 150–400)
RBC: 1.99 MIL/uL — AB (ref 4.22–5.81)
RDW: 18.7 % — ABNORMAL HIGH (ref 11.5–15.5)
WBC: 18 10*3/uL — AB (ref 4.0–10.5)

## 2013-10-30 LAB — LACTATE DEHYDROGENASE: LDH: 557 U/L — ABNORMAL HIGH (ref 94–250)

## 2013-10-30 MED ORDER — HYDROXYUREA 500 MG PO CAPS: 1500.0000 mg | ORAL_CAPSULE | Freq: Every day | ORAL | Status: DC

## 2013-10-30 MED ORDER — HEPARIN SOD (PORK) LOCK FLUSH 100 UNIT/ML IV SOLN
500.0000 [IU] | INTRAVENOUS | Status: DC | PRN
  Filled 2013-10-30: qty 5

## 2013-10-30 MED ORDER — SODIUM CHLORIDE 0.9 % IJ SOLN: 10.0000 mL | INTRAMUSCULAR | Status: DC | PRN

## 2013-10-30 NOTE — Care Management Note (Signed)
CARE MANAGEMENT NOTE 10/30/2013  Patient:  [name], [name]   Account Number:  [id number]  Date Initiated:  10/30/2013  Documentation initiated by:  Marney Doctor  Subjective/Objective Assessment:   33 yo Male admitted with acute hip pain.  Hx of SCC     Action/Plan:   From Home   Anticipated DC Date:  10/31/2013   Anticipated DC Plan:  [place]  CM consult      Choice offered to / List presented to:             Status of service:  In process, will continue to follow Medicare Important Message given?  YES (If response is "NO", the following Medicare IM given date fields will be blank) Date Medicare IM given:  10/30/2013 Medicare IM given by:  Marney Doctor Date Additional Medicare IM given:   Additional Medicare IM given by:    Discharge Disposition:    Per UR Regulation:  Reviewed for med. necessity/level of care/duration of stay  If discussed at [place] of Stay Meetings, dates discussed:    Comments:  10/30/13 Marney Doctor RN,BSN,NCM

## 2013-10-30 NOTE — Discharge Summary (Signed)
[name] MRN: [id number] DOB/AGE: 34-Nov-1982 34 y.o.  Admit date: 10/27/2013 Discharge date: 10/30/2013  Primary Care Physician:  Jahniah Pallas A., MD   Discharge Diagnoses:   Patient Active Problem List   Diagnosis Date Noted  . Acute chest syndrome 06/18/2013    Priority: High  . Sickle cell crisis 06/14/2013    Priority: Medium  . Avascular necrosis     Priority: Medium  . Acute hip pain 10/27/2013  . Sickle cell anemia with crisis 10/17/2013  . Anemia 09/22/2013  . Acute sickle cell crisis 08/26/2013  . Chronic anticoagulation 08/22/2013  . HTN (hypertension) 08/22/2013  . Sickle cell anemia with pain 07/10/2013  . Thrombocytosis 07/10/2013  . Thrombocytosis 07/01/2013  . Pulmonary HTN 06/18/2013  . Bilateral flank pain 06/17/2013  . Sickle cell anemia 06/17/2013  . Hypoxia 06/17/2013  . Hypoxemia 06/17/2013  . Acute respiratory failure with hypoxia 06/02/2013  . HCAP (healthcare-associated pneumonia) 06/02/2013  . Diastolic congestive heart failure, NYHA class 1 05/19/2013  . Sickle-cell crisis with associated acute chest syndrome 05/13/2013  . Functional asplenia   . Alcohol consumption of one to four drinks per day   . Hypokalemia 04/22/2013  . Unspecified vitamin D deficiency 02/13/2013  . Uses marijuana 01/13/2013  . Leukocytosis, unspecified 08/30/2012  . 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 Hx of pulmonary embolus 06/29/2012    DISCHARGE MEDICATION:   Medication List         folic acid 1 MG tablet  Commonly known as:  FOLVITE  Take 1 tablet (1 mg total) by mouth every morning.     HYDROmorphone 4 MG tablet  Commonly known as:  DILAUDID  Take 1 tablet (4 mg total) by mouth every 4 (four) hours as needed for severe pain.     hydroxyurea 500 MG capsule  Commonly known as:  HYDREA  Take 3 capsules (1,500 mg total) by mouth daily. May take with food to minimize GI side effects.     lisinopril 10 MG tablet  Commonly known as:   PRINIVIL,ZESTRIL  Take 1 tablet (10 mg total) by mouth daily.     metoprolol tartrate 25 MG tablet  Commonly known as:  LOPRESSOR  Take 1 tablet (25 mg total) by mouth daily.     morphine 30 MG 12 hr tablet  Commonly known as:  MS CONTIN  Take 1 tablet (30 mg total) by mouth every 12 (twelve) hours.     potassium chloride SA 20 MEQ tablet  Commonly known as:  K-DUR,KLOR-CON  Take 1 tablet (20 mEq total) by mouth every morning.     rivaroxaban 20 MG Tabs tablet  Commonly known as:  XARELTO  Take 1 tablet (20 mg total) by mouth every morning.     zolpidem 10 MG tablet  Commonly known as:  AMBIEN  Take 1 tablet (10 mg total) by mouth at bedtime as needed for sleep.          Consults:     SIGNIFICANT DIAGNOSTIC STUDIES:  Dg Chest 2 View  10/30/2013   CLINICAL DATA:  Difficulty breathing and chest pain; sickle cell disease  EXAM: CHEST  2 VIEW  COMPARISON:  October 27, 2013  FINDINGS: Port-A-Cath tip is at the cavoatrial junction. No pneumothorax. Areas of mild scarring are noted bilaterally. There is no edema or consolidation. Heart is borderline enlarged with pulmonary vascularity showing mild pulmonary venous hypertension. No adenopathy. There are no appreciable bone lesions.  IMPRESSION: Areas of mild scarring bilaterally. Evidence  of mild pulmonary venous congestion, a finding common with sickle cell disease. No frank edema or consolidation.   Electronically Signed   By: Lowella Grip M.D.   On: 10/30/2013 12:16   Dg Chest 2 View  10/27/2013   CLINICAL DATA:  Chest pain and history of sickle cell anemia. Recent fall.  EXAM: CHEST  2 VIEW  COMPARISON:  Chest radiograph 10/07/2013  FINDINGS: There is a left subclavian Port-A-Cath. Catheter tip is in the lower SVC and stable. Negative for a pneumothorax. There are stable patchy linear densities which could represent areas of scarring. No evidence for focal airspace disease. Heart size is upper limits of normal. No pleural  effusions.  IMPRESSION: Patchy parenchymal lung densities are most compatible with chronic changes. No acute airspace disease or edema.   Electronically Signed   By: Markus Daft M.D.   On: 10/27/2013 08:27   Dg Chest 2 View  10/07/2013   CLINICAL DATA:  Sickle-cell crisis.  EXAM: CHEST  2 VIEW  COMPARISON:  10/05/2013  FINDINGS: Left subclavian Port-A-Cath is unchanged. Lungs are somewhat hypoinflated with prominence of the perihilar markings. Mild stable cardiomegaly. Remainder the exam is unchanged.  IMPRESSION: Mild prominence of the perihilar markings which may be due to mild vascular congestion versus viral bronchopneumonia.  Stable cardiomegaly.   Electronically Signed   By: Marin Olp M.D.   On: 10/07/2013 13:49   Dg Chest 2 View  10/05/2013   CLINICAL DATA:  Sickle cell anemia crisis and back pain.  EXAM: CHEST - 2 VIEW  COMPARISON:  09/22/2013  FINDINGS: Port-A-Cath positioning is stable. Stable appearance of scattered areas of pulmonary scarring bilaterally. There is no evidence of pulmonary edema, consolidation, pneumothorax, nodule or pleural fluid. The heart size is stable and at the upper limits of normal. The visualized spine shows no fractures or significant degenerative disease.  IMPRESSION: No active disease.  Stable pulmonary scarring.   Electronically Signed   By: Aletta Edouard M.D.   On: 10/05/2013 08:16   Dg Hip Complete Right  10/27/2013   CLINICAL DATA:  Sickle cell anemia. Fall and right groin pain. History of right hip replacement for avascular necrosis.  EXAM: RIGHT HIP - COMPLETE 2+ VIEW  COMPARISON:  11/13/2010  FINDINGS: Patient has a right hip arthroplasty. The hip arthroplasty is located without complicating features. There is a small gap between the prosthesis and the femur intertrochanteric region but unclear if this represents an interval change. No significant lucency around the femoral stem. Prosthesis appears to be well seated in the femoral bone on the lateral view.  Pelvic bony ring is intact. Stool in the rectal region.  IMPRESSION: No acute bone abnormality in the pelvis or right hip.  Right hip arthroplasty without complicating features.   Electronically Signed   By: Markus Daft M.D.   On: 10/27/2013 08:33   Ct Hip Right Wo Contrast  10/27/2013   CLINICAL DATA:  RIGHT hip pain post fall, history sickle cell disease, prior avascular necrosis RIGHT hip, RIGHT hip replacement  EXAM: CT OF THE RIGHT HIP WITHOUT CONTRAST  TECHNIQUE: Multidetector CT imaging of the right hip was performed according to the standard protocol. Multiplanar CT image reconstructions were also generated.  COMPARISON:  RIGHT hip radiographs 10/27/2013  FINDINGS: Severe artifacts secondary to metallic components of RIGHT hip prosthesis.  Visualized pelvis grossly intact.  No definite acute fracture of dislocation seen within limitations of metallic artifacts.  Bones appear demineralized.  No significant periprosthetic lucency.  Nutrient foramen seen at the posterior aspect of the femoral diaphysis at the level of the distal portion of the femoral component.  No definite acute osseous abnormalities identified.  Plate and screws seen at the posterior column RIGHT acetabular post ORIF.  Intrapelvic soft tissues unremarkable.  IMPRESSION: RIGHT hip prosthesis.  No definite acute osseous findings.   Electronically Signed   By: Lavonia Dana M.D.   On: 10/27/2013 16:27     Recent Results (from the past 240 hour(s))  MRSA PCR SCREENING     Status: None   Collection Time    10/28/13  1:59 AM      Result Value Ref Range Status   MRSA by PCR NEGATIVE  NEGATIVE Final   Comment:            The GeneXpert MRSA Assay (FDA     approved for NASAL specimens     only), is one component of a     comprehensive MRSA colonization     surveillance program. It is not     intended to diagnose MRSA     infection nor to guide or     monitor treatment for     MRSA infections.    BRIEF ADMITTING H & P: TEMITOPE GRIFFING is a 33 y.o. male with a past medical history of sickle cell anemia, history of avascular necrosis of right hip, history of pulmonary embolism on Xarelto therapy, who presents to the emergency room with complaints of severe right hip pain. Over the past 6 months he has had multiple hospitalizations for sickle cell crisis. He was last seen in the emergency room yesterday as he presented with complaints of pain, improved with administration of IV narcotics. He was discharged in stable condition from the emergency room to followup with his PCP. Overnight at approximately 2:00 in the morning he reports being out with his brothers, slipped on the wet concrete and fell on his right side. He experienced immediate right hip pain, unable to bear weight. Right hip x-ray did not reveal acute bony abnormality, with right hip arthroplasty not showing complicating features. In the emergency room he was still unable to bear weight despite receiving several doses of IV Dilaudid. Medicine asked to admit patient for pain control.     Hospital Course:  Present on Admission:  . Sickle cell anemia with crisis: Pr was admitted in vaso-occlusive/hemolytic crisis likely triggered by the weather and his recent fall.  He was managed with Dilaudid via PCA and continued on MS Contin. Toradol was also used for anti-inflammatory effect. He was transitioned to oral analgesics and his pain was at a level of 6/10 prior to discharge from the hospital. On the day of discharge, patient complained of pleuritic pain in the right posterio-lateral chest wall an x-ray was performed which showed no acute changes. He was not evaluated for PE as he is chronically on Xarelto and had no hypoxemia, tachycardia or decrease in BP.  He reports compliance with Xarelto and has only had a recurrance of PE when non-compliant with Xarelto. Furthermore he has had similar symptoms in the past which when investigated for PE were found to be normal.  .  Sickle cell anemia : Pt continues to have a Hb at a baseline of 6.5-7. He had an increase of his dose of Hydrea during this hospitalization. Her is to have a CBC checked in the clinic on Friday 11/01/2013.  Marland Kitchen Acute hip pain: Pt is S/P fall while playing  around with his brother. Radiological evaluation shows prosthesis intact. At the time of discharge, patient had no pain in the hip region and was ambulating without difficulty.  Marland Kitchen HTN (hypertension): Continue current medications. BP stable  . Hx of pulmonary embolus/ Chronic anticoagulation: Continue Xarelto.    Disposition and Follow-up: Pt discharged in good condition and is to follow up for labs on Friday 11/01/2013 and call to schedule an appointment in 1 week.     Discharge Instructions   Activity as tolerated - No restrictions    Complete by:  As directed      Diet - low sodium heart healthy    Complete by:  As directed            DISCHARGE EXAM:  General: Alert, awake, oriented x3, in no acute distress.  Vital Signs: BP 120/72, HR 85, T 98.3 F (36.8 C), temperature source Oral, RR 18, height 6' (1.829 m), weight 156 lb (70.761 kg), SpO2 100.00%. HEENT: [place]/AT PEERL, EOMI, mild icterus  Neck: Trachea midline, no masses, no thyromegal,y no JVD, no carotid bruit  OROPHARYNX: Moist, No exudate/ erythema/lesions.  Heart: Regular rate and rhythm, without murmurs, rubs, gallops.  Lungs: Clear to auscultation, no wheezing or rhonchi noted.  Abdomen: Soft, nontender, nondistended, positive bowel sounds, no masses no hepatosplenomegaly noted.  Neuro: No focal neurological deficits noted cranial nerves II through XII grossly intact. Strength normal in bilateral upper and lower extremities.  Musculoskeletal: No warm swelling or erythema around joints, no spinal tenderness noted.  Psychiatric: Patient alert and oriented x3, good insight and cognition, good recent to remote recall.  Lymph node survey: No cervical axillary or inguinal  lymphadenopathy noted.    Recent Labs  10/28/13 0613 10/29/13 0525  NA 135* 137  K 3.7 4.0  CL 103 103  CO2 20 21  GLUCOSE 129* 110*  BUN 5* 9  CREATININE 0.56 0.60  CALCIUM 8.7 8.6   No results found for this basename: AST, ALT, ALKPHOS, BILITOT, PROT, ALBUMIN,  in the last 72 hours No results found for this basename: LIPASE, AMYLASE,  in the last 72 hours  Recent Labs  10/29/13 0525 10/30/13 0400  WBC 21.1* 18.0*  NEUTROABS 14.8* 11.9*  HGB 6.8* 6.4*  HCT 18.9* 18.1*  MCV 94.0 91.0  PLT 392 437*   Total time spent 40 minutes.  Signed: Aleksander Edmiston A. 10/30/2013, 2:04 PM

## 2013-10-30 NOTE — Progress Notes (Signed)
4mg  of Dilaudid PCA wasted in sink witnessed by Meredith,RN.

## 2013-10-30 NOTE — Progress Notes (Signed)
I witnessed RN Vida Roller waste 4 mg of Dilaudid (High dose PCA) down the sink.Azzie Glatter [place]

## 2013-10-30 NOTE — Progress Notes (Signed)
16 ml of High Concentration Dilaudid wasted in the sink.

## 2013-10-30 NOTE — Progress Notes (Signed)
CRITICAL VALUE ALERT  Critical value received:  Hgb 6.4   Date of notification:  10/30/13  Time of notification:  8416  Critical value read back:Yes.    Nurse who received alert:  S.Young,RN  MD notified (1st page):  K. Schorr,NP (notified via text that Hgb was 6.8 on 9/29)  Time of first page:  0452  MD notified (2nd page):  Time of second page:  Responding MD:    Time MD responded:

## 2013-11-02 LAB — TYPE AND SCREEN
ABO/RH(D): O POS
Antibody Screen: NEGATIVE
UNIT DIVISION: 0

## 2013-11-06 ENCOUNTER — Telehealth (HOSPITAL_COMMUNITY): Payer: Self-pay | Admitting: Hematology

## 2013-11-06 ENCOUNTER — Telehealth: Payer: Self-pay | Admitting: Hematology

## 2013-11-06 NOTE — Telephone Encounter (Signed)
Advised patient I spoke with Dr. Zigmund Daniel, and it is ok for him to come to [facility].  Patient verbalizes understanding.

## 2013-11-06 NOTE — Telephone Encounter (Signed)
Patient C/O pain to right rib that he rates 8/10.  Patient states it has been hurting for 1 week.  Denies N/V, abdominal pain, chest pain, shortness of breath, fever or diarrhea.  Explained I would notify physician and give him a call back.  Patient verbalizes understanding.

## 2013-11-06 NOTE — Telephone Encounter (Signed)
Patient called in to advise that he is not able to come into the [facility] today.  Patient states he will keep taking his home medications and call tomorrow if needed.  Patient then asked about ambien prescription.  I explained that it was ready for pick up.  Patient verbalizes understanding.

## 2013-11-06 NOTE — Telephone Encounter (Signed)
Attempted to call patient to see if he was still coming to [facility].  Left message.

## 2013-11-07 ENCOUNTER — Telehealth (HOSPITAL_COMMUNITY): Payer: Self-pay | Admitting: Hematology

## 2013-11-07 ENCOUNTER — Encounter (HOSPITAL_COMMUNITY): Payer: Self-pay | Admitting: Hematology

## 2013-11-07 ENCOUNTER — Non-Acute Institutional Stay (HOSPITAL_COMMUNITY)
Admission: AD | Admit: 2013-11-07 | Discharge: 2013-11-07 | Disposition: A | Discharge status: Home / Self Care | Payer: Medicare Other | Attending: Internal Medicine | Admitting: Internal Medicine

## 2013-11-07 DIAGNOSIS — D5700 Hb-SS disease with crisis, unspecified: Secondary | ICD-10-CM

## 2013-11-07 LAB — CBC WITH DIFFERENTIAL/PLATELET
BASOS PCT: 0 % (ref 0–1)
Basophils Absolute: 0 10*3/uL (ref 0.0–0.1)
Eosinophils Absolute: 0 10*3/uL (ref 0.0–0.7)
Eosinophils Relative: 0 % (ref 0–5)
HCT: 19.8 % — ABNORMAL LOW (ref 39.0–52.0)
HEMOGLOBIN: 6.9 g/dL — AB (ref 13.0–17.0)
Lymphocytes Relative: 6 % — ABNORMAL LOW (ref 12–46)
Lymphs Abs: 1.7 10*3/uL (ref 0.7–4.0)
MCH: 33 pg (ref 26.0–34.0)
MCHC: 34.8 g/dL (ref 30.0–36.0)
MCV: 94.7 fL (ref 78.0–100.0)
MONO ABS: 2.5 10*3/uL — AB (ref 0.1–1.0)
Monocytes Relative: 9 % (ref 3–12)
NEUTROS ABS: 24 10*3/uL — AB (ref 1.7–7.7)
Neutrophils Relative %: 85 % — ABNORMAL HIGH (ref 43–77)
PLATELETS: 661 10*3/uL — AB (ref 150–400)
RBC: 2.09 MIL/uL — AB (ref 4.22–5.81)
RDW: 20.9 % — ABNORMAL HIGH (ref 11.5–15.5)
WBC: 28.2 10*3/uL — ABNORMAL HIGH (ref 4.0–10.5)

## 2013-11-07 LAB — RETICULOCYTES
RBC.: 2.09 MIL/uL — ABNORMAL LOW (ref 4.22–5.81)
Retic Count, Absolute: 405.5 10*3/uL — ABNORMAL HIGH (ref 19.0–186.0)
Retic Ct Pct: 19.4 % — ABNORMAL HIGH (ref 0.4–3.1)

## 2013-11-07 LAB — COMPREHENSIVE METABOLIC PANEL
ALT: 23 U/L (ref 0–53)
AST: 42 U/L — ABNORMAL HIGH (ref 0–37)
Albumin: 4.2 g/dL (ref 3.5–5.2)
Alkaline Phosphatase: 95 U/L (ref 39–117)
Anion gap: 12 (ref 5–15)
BUN: 8 mg/dL (ref 6–23)
CALCIUM: 9 mg/dL (ref 8.4–10.5)
CO2: 22 mEq/L (ref 19–32)
Chloride: 105 mEq/L (ref 96–112)
Creatinine, Ser: 0.57 mg/dL (ref 0.50–1.35)
GFR calc Af Amer: >90 mL/min (ref >90)
GLUCOSE: 91 mg/dL (ref 70–99)
Potassium: 3.8 mEq/L (ref 3.7–5.3)
Sodium: 139 mEq/L (ref 137–147)
Total Bilirubin: 5.8 mg/dL — ABNORMAL HIGH (ref 0.3–1.2)
Total Protein: 7.8 g/dL (ref 6.0–8.3)

## 2013-11-07 MED ORDER — NALOXONE HCL 0.4 MG/ML IJ SOLN: 0.4000 mg | INTRAMUSCULAR | Status: DC | PRN

## 2013-11-07 MED ORDER — HYDROMORPHONE HCL 4 MG PO TABS
4.0000 mg | ORAL_TABLET | Freq: Once | ORAL | Status: AC
  Administered 2013-11-07: 4 mg via ORAL
  Filled 2013-11-07: qty 1

## 2013-11-07 MED ORDER — FOLIC ACID 1 MG PO TABS: 1.0000 mg | ORAL_TABLET | Freq: Every day | ORAL | Status: DC

## 2013-11-07 MED ORDER — ONDANSETRON HCL 4 MG PO TABS: 4.0000 mg | ORAL_TABLET | ORAL | Status: DC | PRN

## 2013-11-07 MED ORDER — ONDANSETRON HCL 4 MG/2ML IJ SOLN: 4.0000 mg | INTRAMUSCULAR | Status: DC | PRN

## 2013-11-07 MED ORDER — HYDROMORPHONE 2 MG/ML HIGH CONCENTRATION IV PCA SOLN
INTRAVENOUS | Status: DC
  Administered 2013-11-07: 16.7 mg via INTRAVENOUS
  Administered 2013-11-07: 12:00:00 via INTRAVENOUS
  Filled 2013-11-07: qty 25

## 2013-11-07 MED ORDER — MORPHINE SULFATE ER 30 MG PO TBCR: 30.0000 mg | EXTENDED_RELEASE_TABLET | Freq: Two times a day (BID) | ORAL | Status: DC

## 2013-11-07 MED ORDER — SODIUM CHLORIDE 0.9 % IV SOLN: 25.0000 mg | INTRAVENOUS | Status: DC | PRN

## 2013-11-07 MED ORDER — HEPARIN SOD (PORK) LOCK FLUSH 100 UNIT/ML IV SOLN
500.0000 [IU] | INTRAVENOUS | Status: AC | PRN
  Administered 2013-11-07: 500 [IU]
  Filled 2013-11-07: qty 5

## 2013-11-07 MED ORDER — SODIUM CHLORIDE 0.9 % IJ SOLN: 10.0000 mL | INTRAMUSCULAR | Status: DC | PRN

## 2013-11-07 MED ORDER — KETOROLAC TROMETHAMINE 30 MG/ML IJ SOLN
30.0000 mg | Freq: Four times a day (QID) | INTRAMUSCULAR | Status: DC
  Administered 2013-11-07: 30 mg via INTRAVENOUS
  Filled 2013-11-07: qty 1

## 2013-11-07 MED ORDER — SODIUM CHLORIDE 0.9 % IJ SOLN: 9.0000 mL | INTRAMUSCULAR | Status: DC | PRN

## 2013-11-07 MED ORDER — DIPHENHYDRAMINE HCL 25 MG PO CAPS: 25.0000 mg | ORAL_CAPSULE | ORAL | Status: DC | PRN

## 2013-11-07 MED ORDER — DEXTROSE-NACL 5-0.45 % IV SOLN
INTRAVENOUS | Status: DC
  Administered 2013-11-07: 12:00:00 via INTRAVENOUS

## 2013-11-07 NOTE — Progress Notes (Signed)
Pt discharged to home; pt alert and oriented; ambulatory; port deaccessed with no complications noted; discharge instructions explained, given, and signed with all questions answered

## 2013-11-07 NOTE — Telephone Encounter (Signed)
Meds ordered this encounter  Medications  . morphine (MS CONTIN) 30 MG 12 hr tablet    Sig: Take 1 tablet (30 mg total) by mouth every 12 (twelve) hours.    Dispense:  60 tablet    Refill:  0    Order Specific Question:  Supervising Provider    Answer:  Liston Alba A [[id number]]  Eathan Groman M, FNP Reviewed [place] [employer] Reporting system prior to reorder

## 2013-11-07 NOTE — Telephone Encounter (Signed)
Patient C/O pain to right rib that is 8/10 on pain scale.  Patient denies N/V, shortness of breath or difficulty breathing, abdominal pain, fever, diarrhea.  Patient is requesting to come to [facility].  I advised I would notify the physician and give him a call back.  Patient verbalizes understanding

## 2013-11-07 NOTE — Progress Notes (Signed)
CRITICAL VALUE ALERT  Critical value received:  hgb 6.9  Date of notification: 11/07/2013  Time of notification: 9935  Critical value read back: yes  Nurse who received alert:  K. Savannaha Stonerock  MD notified (1st page): Dr. Jonelle Sidle  Time of first page: 1232  Responding MD notified: Dr. Jonelle Sidle  Time MD responded: 1234, to continue to monitor

## 2013-11-07 NOTE — Discharge Instructions (Signed)
Sickle Cell Anemia °Sickle cell anemia is a condition where your red blood cells are shaped like sickles. Red blood cells carry oxygen through the body. Sickle-shaped red blood cells do not live as long as normal red blood cells. They also clump together and block blood from flowing through the blood vessels. These things prevent the body from getting enough oxygen. Sickle cell anemia causes organ damage and pain. It also increases the risk of infection. °HOME CARE °· Drink enough fluid to keep your pee (urine) clear or pale yellow. Drink more in hot weather and during exercise. °· Do not smoke. Smoking lowers oxygen levels in the blood. °· Only take over-the-counter or prescription medicines as told by your doctor. °· Take antibiotic medicines as told by your doctor. Make sure you finish them even if you start to feel better. °· Take supplements as told by your doctor. °· Consider wearing a medical alert bracelet. This tells anyone caring for you in an emergency of your condition. °· When traveling, keep your medical information, doctors' names, and the medicines you take with you at all times. °· If you have a fever, do not take fever medicines right away. This could cover up a problem. Tell your doctor. °·  Keep all follow-up visits with your doctor. Sickle cell anemia requires regular medical care. °GET HELP IF: °You have a fever. °GET HELP RIGHT AWAY IF: °· You feel dizzy or faint. °· You have new belly (abdominal) pain, especially on the left side near the stomach area. °· You have a lasting, often uncomfortable and painful erection of the penis (priapism). If it is not treated right away, you will become unable to have sex (impotence). °· You have numbness in your arms or legs or you have a hard time moving them. °· You have a hard time talking. °· You have a fever or lasting symptoms for more than 2-3 days. °· You have a fever and your symptoms suddenly get worse. °· You have signs or symptoms of infection.  These include: °¨ Chills. °¨ Being more tired than normal (lethargy). °¨ Irritability. °¨ Poor eating. °¨ Throwing up (vomiting). °· You have pain that is not helped with medicine. °· You have shortness of breath. °· You have pain in your chest. °· You are coughing up pus-like or bloody mucus. °· You have a stiff neck. °· Your feet or hands swell or have pain. °· Your belly looks bloated. °· Your joints hurt. °MAKE SURE YOU: °· Understand these instructions. °· Will watch your condition. °· Will get help right away if you are not doing well or get worse. °Document Released: 11/07/2012 Document Revised: 06/03/2013 Document Reviewed: 11/07/2012 °Exit[employer]® Patient Information ©2015 [employer]. This information is not intended to replace advice given to you by your health care provider. Make sure you discuss any questions you have with your health care provider. ° °

## 2013-11-07 NOTE — Telephone Encounter (Signed)
Medication refill request for MS Contin 30mg  / LOV 08/22/2013

## 2013-11-07 NOTE — Discharge Summary (Signed)
Physician Discharge Summary  [name] FXT:[id number] DOB: Apr 05, 1980 DOA: 11/07/2013  PCP: Gerald A., MD  Admit date: 11/07/2013 Discharge date: 11/07/2013  Discharge Diagnoses:  Active Problems:   Sickle cell anemia with crisis   Discharge Condition: Stable  Disposition:   Diet:Heart Healthy Wt Readings from Last 3 Encounters:  11/07/13 156 lb (70.761 kg)  10/30/13 156 lb (70.761 kg)  10/17/13 163 lb (73.936 kg)     Hospital Course:  [name] was admitted to the day infusion center for extended observation. Plan was outlined and reviewed by Dr. Jonelle [name]. He was started on hypotonic IV fluids for cellular re-hydration and given Tramadol for inflammation. [name] was started on a custom dose, high concentration PCA dilaudid. He used a total of 16.7 mg with 20 demands and 10 deliveries.  He states that pain intensity is currently  4/10. He is functional and states that he can manage at home on current medication regimen. Patient is to follow up with Dr. Zigmund [name] as scheduled.   Discharge Exam:  Filed Vitals:   11/07/13 1553  BP: 125/71  Pulse: 82  Temp: 98.5 F (36.9 C)  Resp: 18   Filed Vitals:   11/07/13 1258 11/07/13 1358 11/07/13 1458 11/07/13 1553  BP: 127/81 112/77 121/73 125/71  Pulse: 78 76 70 82  Temp: 98.4 F (36.9 C) 98.1 F (36.7 C) 98.6 F (37 C) 98.5 F (36.9 C)  TempSrc: Oral Oral Oral Oral  Resp: 20 18 18 18   Height:      Weight:      SpO2: 97% 97% 96% 95%     General: Alert, awake, oriented x3, in no acute distress.  HEENT: [place]/AT PEERL, EOMI Neck: Trachea midline,  no masses, no thyromegal,y no JVD, no carotid bruit OROPHARYNX:  Moist, No exudate/ erythema/lesions.  Heart: Regular rate and rhythm, without murmurs, rubs, gallops, PMI non-displaced, no heaves or thrills on palpation.  Lungs: Clear to auscultation, no wheezing or rhonchi noted. No increased vocal fremitus resonant to percussion  Abdomen: Soft, nontender,  nondistended, positive bowel sounds, no masses no hepatosplenomegaly noted..  Neuro: No focal neurological deficits noted cranial nerves II through XII grossly intact. DTRs 2+ bilaterally upper and lower extremities. Strength 5 out of 5 in bilateral upper and lower extremities. Musculoskeletal: No warm swelling or erythema around joints, no spinal tenderness noted. Mild tenderness to left flank Psychiatric: Patient alert and oriented x3, good insight and cognition, good recent to remote recall. Lymph node survey: No cervical axillary or inguinal lymphadenopathy noted.   Discharge Instructions     Medication List         folic acid 1 MG tablet  Commonly known as:  FOLVITE  Take 1 tablet (1 mg total) by mouth every morning.     HYDROmorphone 4 MG tablet  Commonly known as:  DILAUDID  Take 1 tablet (4 mg total) by mouth every 4 (four) hours as needed for severe pain.     hydroxyurea 500 MG capsule  Commonly known as:  HYDREA  Take 3 capsules (1,500 mg total) by mouth daily. May take with food to minimize GI side effects.     lisinopril 10 MG tablet  Commonly known as:  PRINIVIL,ZESTRIL  Take 1 tablet (10 mg total) by mouth daily.     metoprolol tartrate 25 MG tablet  Commonly known as:  LOPRESSOR  Take 1 tablet (25 mg total) by mouth daily.     morphine 30 MG 12 hr tablet  Commonly  known as:  MS CONTIN  Take 1 tablet (30 mg total) by mouth every 12 (twelve) hours.     potassium chloride SA 20 MEQ tablet  Commonly known as:  K-DUR,KLOR-CON  Take 1 tablet (20 mEq total) by mouth every morning.     rivaroxaban 20 MG Tabs tablet  Commonly known as:  XARELTO  Take 1 tablet (20 mg total) by mouth every morning.     zolpidem 10 MG tablet  Commonly known as:  AMBIEN  Take 1 tablet (10 mg total) by mouth at bedtime as needed for sleep.          The results of significant diagnostics from this hospitalization (including imaging, microbiology, ancillary and laboratory) are  listed below for reference.    Significant Diagnostic Studies: Dg Chest 2 View  10/30/2013   CLINICAL DATA:  Difficulty breathing and chest pain; sickle cell disease  EXAM: CHEST  2 VIEW  COMPARISON:  October 27, 2013  FINDINGS: Port-A-Cath tip is at the cavoatrial junction. No pneumothorax. Areas of mild scarring are noted bilaterally. There is no edema or consolidation. Heart is borderline enlarged with pulmonary vascularity showing mild pulmonary venous hypertension. No adenopathy. There are no appreciable bone lesions.  IMPRESSION: Areas of mild scarring bilaterally. Evidence of mild pulmonary venous congestion, a finding common with sickle cell disease. No frank edema or consolidation.   Electronically Signed   By: Lowella Grip M.D.   On: 10/30/2013 12:16   Dg Chest 2 View  10/27/2013   CLINICAL DATA:  Chest pain and history of sickle cell anemia. Recent fall.  EXAM: CHEST  2 VIEW  COMPARISON:  Chest radiograph 10/07/2013  FINDINGS: There is a left subclavian Port-A-Cath. Catheter tip is in the lower SVC and stable. Negative for a pneumothorax. There are stable patchy linear densities which could represent areas of scarring. No evidence for focal airspace disease. Heart size is upper limits of normal. No pleural effusions.  IMPRESSION: Patchy parenchymal lung densities are most compatible with chronic changes. No acute airspace disease or edema.   Electronically Signed   By: Markus Daft M.D.   On: 10/27/2013 08:27   Dg Hip Complete Right  10/27/2013   CLINICAL DATA:  Sickle cell anemia. Fall and right groin pain. History of right hip replacement for avascular necrosis.  EXAM: RIGHT HIP - COMPLETE 2+ VIEW  COMPARISON:  11/13/2010  FINDINGS: Patient has a right hip arthroplasty. The hip arthroplasty is located without complicating features. There is a small gap between the prosthesis and the femur intertrochanteric region but unclear if this represents an interval change. No significant lucency  around the femoral stem. Prosthesis appears to be well seated in the femoral bone on the lateral view. Pelvic bony ring is intact. Stool in the rectal region.  IMPRESSION: No acute bone abnormality in the pelvis or right hip.  Right hip arthroplasty without complicating features.   Electronically Signed   By: Markus Daft M.D.   On: 10/27/2013 08:33   Ct Hip Right Wo Contrast  10/27/2013   CLINICAL DATA:  RIGHT hip pain post fall, history sickle cell disease, prior avascular necrosis RIGHT hip, RIGHT hip replacement  EXAM: CT OF THE RIGHT HIP WITHOUT CONTRAST  TECHNIQUE: Multidetector CT imaging of the right hip was performed according to the standard protocol. Multiplanar CT image reconstructions were also generated.  COMPARISON:  RIGHT hip radiographs 10/27/2013  FINDINGS: Severe artifacts secondary to metallic components of RIGHT hip prosthesis.  Visualized pelvis grossly  intact.  No definite acute fracture of dislocation seen within limitations of metallic artifacts.  Bones appear demineralized.  No significant periprosthetic lucency.  Nutrient foramen seen at the posterior aspect of the femoral diaphysis at the level of the distal portion of the femoral component.  No definite acute osseous abnormalities identified.  Plate and screws seen at the posterior column RIGHT acetabular post ORIF.  Intrapelvic soft tissues unremarkable.  IMPRESSION: RIGHT hip prosthesis.  No definite acute osseous findings.   Electronically Signed   By: Lavonia Dana M.D.   On: 10/27/2013 16:27    Microbiology: No results found for this or any previous visit (from the past 240 hour(s)).   Labs: Basic Metabolic Panel:  Recent Labs Lab 11/07/13 1124  NA 139  K 3.8  CL 105  CO2 22  GLUCOSE 91  BUN 8  CREATININE 0.57  CALCIUM 9.0   Liver Function Tests:  Recent Labs Lab 11/07/13 1124  AST 42*  ALT 23  ALKPHOS 95  BILITOT 5.8*  PROT 7.8  ALBUMIN 4.2   No results found for this basename: LIPASE, AMYLASE,  in  the last 168 hours No results found for this basename: AMMONIA,  in the last 168 hours CBC:  Recent Labs Lab 11/07/13 1124  WBC 28.2*  NEUTROABS 24.0*  HGB 6.9*  HCT 19.8*  MCV 94.7  PLT 661*   Cardiac Enzymes: No results found for this basename: CKTOTAL, CKMB, CKMBINDEX, TROPONINI,  in the last 168 hours BNP: No components found with this basename: POCBNP,  CBG: No results found for this basename: GLUCAP,  in the last 168 hours Ferritin: No results found for this basename: FERRITIN,  in the last 168 hours  Time coordinating discharge: Greater than 30 minutes  Signed:  Byrd Rushlow M  11/07/2013, 6:03 PM

## 2013-11-07 NOTE — H&P (Signed)
[name] is an 33 y.o. male.   Chief Complaint: Pain lower back and legs HPI: This is a 33 year old gentleman well-known to our service with known sickle cell disease who is here today with significant pain in his low back and legs that is not relieved by his home medications. Patient has been to work and has been working hard in the last few weeks. He was having pain yesterday and was going to come to the clinic but got somewhat better. Today however the pain was severe that he couldn't function well at work so he decided to come in. Pain is described as 10 out of 10 involving his low back and lower legs. Not relieved by anything and aggravated by all activities. No fever no nausea vomiting or diarrhea no significant shortness of breath no cough. Patient decided to come to the clinic for pain management and relief of his sickle cell crisis.  Past Medical History  Diagnosis Date  . Sickle cell anemia   . Blood transfusion   . 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 Hypokalemia   . Mood disorder   . History of pulmonary embolus (PE)   . Avascular necrosis   . Leukocytosis     Chronic  . Thrombocytosis     Chronic  . Hypertension   . History of Clostridium difficile infection   . Uses marijuana   . Chronic anticoagulation   . Functional asplenia   . Former smoker   . Second hand tobacco smoke exposure   . Alcohol consumption of one to four drinks per day     Past Surgical History  Procedure Laterality Date  . Right hip replacement      08/2006  . Cholecystectomy      01/2008  . Porta cath placement    . Porta cath removal    . Umbilical hernia repair      01/2008  . Excision of left periauricular cyst      10/2009  . Excision of right ear lobe cyst with primary closur      11/2007  . Portacath placement  01/05/2012    Procedure: INSERTION PORT-A-CATH;  Surgeon: Odis Hollingshead, MD;  Location: [place];  Service: General;  Laterality: N/A;   ultrasound guiced port a cath insertion with fluoroscopy    Family History  Problem Relation Age of Onset  . Sickle cell anemia Mother   . Sickle cell anemia Father   . Sickle cell trait Brother    Social History:  reports that he quit smoking about 3 years ago. He has never used smokeless tobacco. He reports that he uses illicit drugs about twice per week. He reports that he does not drink alcohol.  Allergies:  Allergies  Allergen Reactions  . Morphine And Related Hives and Rash    Pt states he can take PO, but not IV and he is able to tolerate Dilaudid with no reactions.    Medications Prior to Admission  Medication Sig Dispense Refill  . folic acid (FOLVITE) 1 MG tablet Take 1 tablet (1 mg total) by mouth every morning.  30 tablet  11  . HYDROmorphone (DILAUDID) 4 MG tablet Take 1 tablet (4 mg total) by mouth every 4 (four) hours as needed for severe pain.  90 tablet  0  . hydroxyurea (HYDREA) 500 MG capsule Take 3 capsules (1,500 mg total) by mouth daily. May take with food to minimize GI side effects.  60 capsule  3  . lisinopril (PRINIVIL,ZESTRIL) 10 MG tablet Take 1 tablet (10 mg total) by mouth daily.  30 tablet  0  . metoprolol tartrate (LOPRESSOR) 25 MG tablet Take 1 tablet (25 mg total) by mouth daily.  30 tablet  6  . morphine (MS CONTIN) 30 MG 12 hr tablet Take 1 tablet (30 mg total) by mouth every 12 (twelve) hours.  60 tablet  0  . potassium chloride SA (K-DUR,KLOR-CON) 20 MEQ tablet Take 1 tablet (20 mEq total) by mouth every morning.  30 tablet  3  . rivaroxaban (XARELTO) 20 MG TABS tablet Take 1 tablet (20 mg total) by mouth every morning.  30 tablet  0  . zolpidem (AMBIEN) 10 MG tablet Take 1 tablet (10 mg total) by mouth at bedtime as needed for sleep.  30 tablet  3    No results found for this or any previous visit (from the past 48 hour(s)). No results found.  Review of Systems  Constitutional: Negative.   HENT: Negative.   Eyes: Negative.   Respiratory:  Negative.   Cardiovascular: Negative.   Gastrointestinal: Negative.   Genitourinary: Negative.   Musculoskeletal: Positive for back pain, joint pain and myalgias. Negative for falls.  Skin: Negative.   Neurological: Negative.   Endo/Heme/Allergies: Negative.   Psychiatric/Behavioral: Negative.     Blood pressure 124/72, pulse 65, temperature 98.9 F (37.2 C), temperature source Oral, resp. rate 20, height 6' (1.829 m), weight 70.761 kg (156 lb), SpO2 100.00%. Physical Exam  Constitutional: He is oriented to person, place, and time. He appears well-developed and well-nourished.  HENT:  Head: Normocephalic and atraumatic.  Right Ear: External ear normal.  Left Ear: External ear normal.  Mouth/Throat: Oropharynx is clear and moist.  Eyes: Conjunctivae and EOM are normal. Pupils are equal, round, and reactive to light.  Neck: Normal range of motion. Neck supple.  Cardiovascular: Normal rate, regular rhythm, normal heart sounds and intact distal pulses.   Respiratory: Effort normal and breath sounds normal.  GI: Soft. Bowel sounds are normal.  Musculoskeletal: Normal range of motion.  Neurological: He is alert and oriented to person, place, and time. He has normal reflexes.  Skin: Skin is warm and dry.     Assessment/Plan A 33 year old gentleman here with sickle cell painful crisis.  #1 sickle cell painful crisis: Patient is having pain not relieved by his home therapy. We will start him on Dilaudid PCA high concentration with Toradol and IV fluids. Continue his home medications and frequent pain assessment. Hopefully his symptoms will be relieved and his pain can go from 10 to somewhere around 45 prior to discharge home at the end of the day.  #2 sickle cell anemia: We will monitor his H&H.  #3 mild dehydration: Patient will be hydrated with D5 half normal saline.  Amyrah Pinkhasov,[name] 11/07/2013, 11:06 AM

## 2013-11-07 NOTE — Telephone Encounter (Signed)
Advised patient I spoke with Dr. Jonelle Sidle and it is ok for him to come to [facility]  Patient verbalizes understanding.

## 2013-11-08 IMAGING — CR DG CHEST 2V
2 series · 2 of 2 positions shown · non-contrast
Comparison: 11/07/2010

CLINICAL DATA: Shortness of breath.  Pain secondary to sickle cell
disease.

CHEST - 2 VIEW

[w chest pa (1 of 2)]
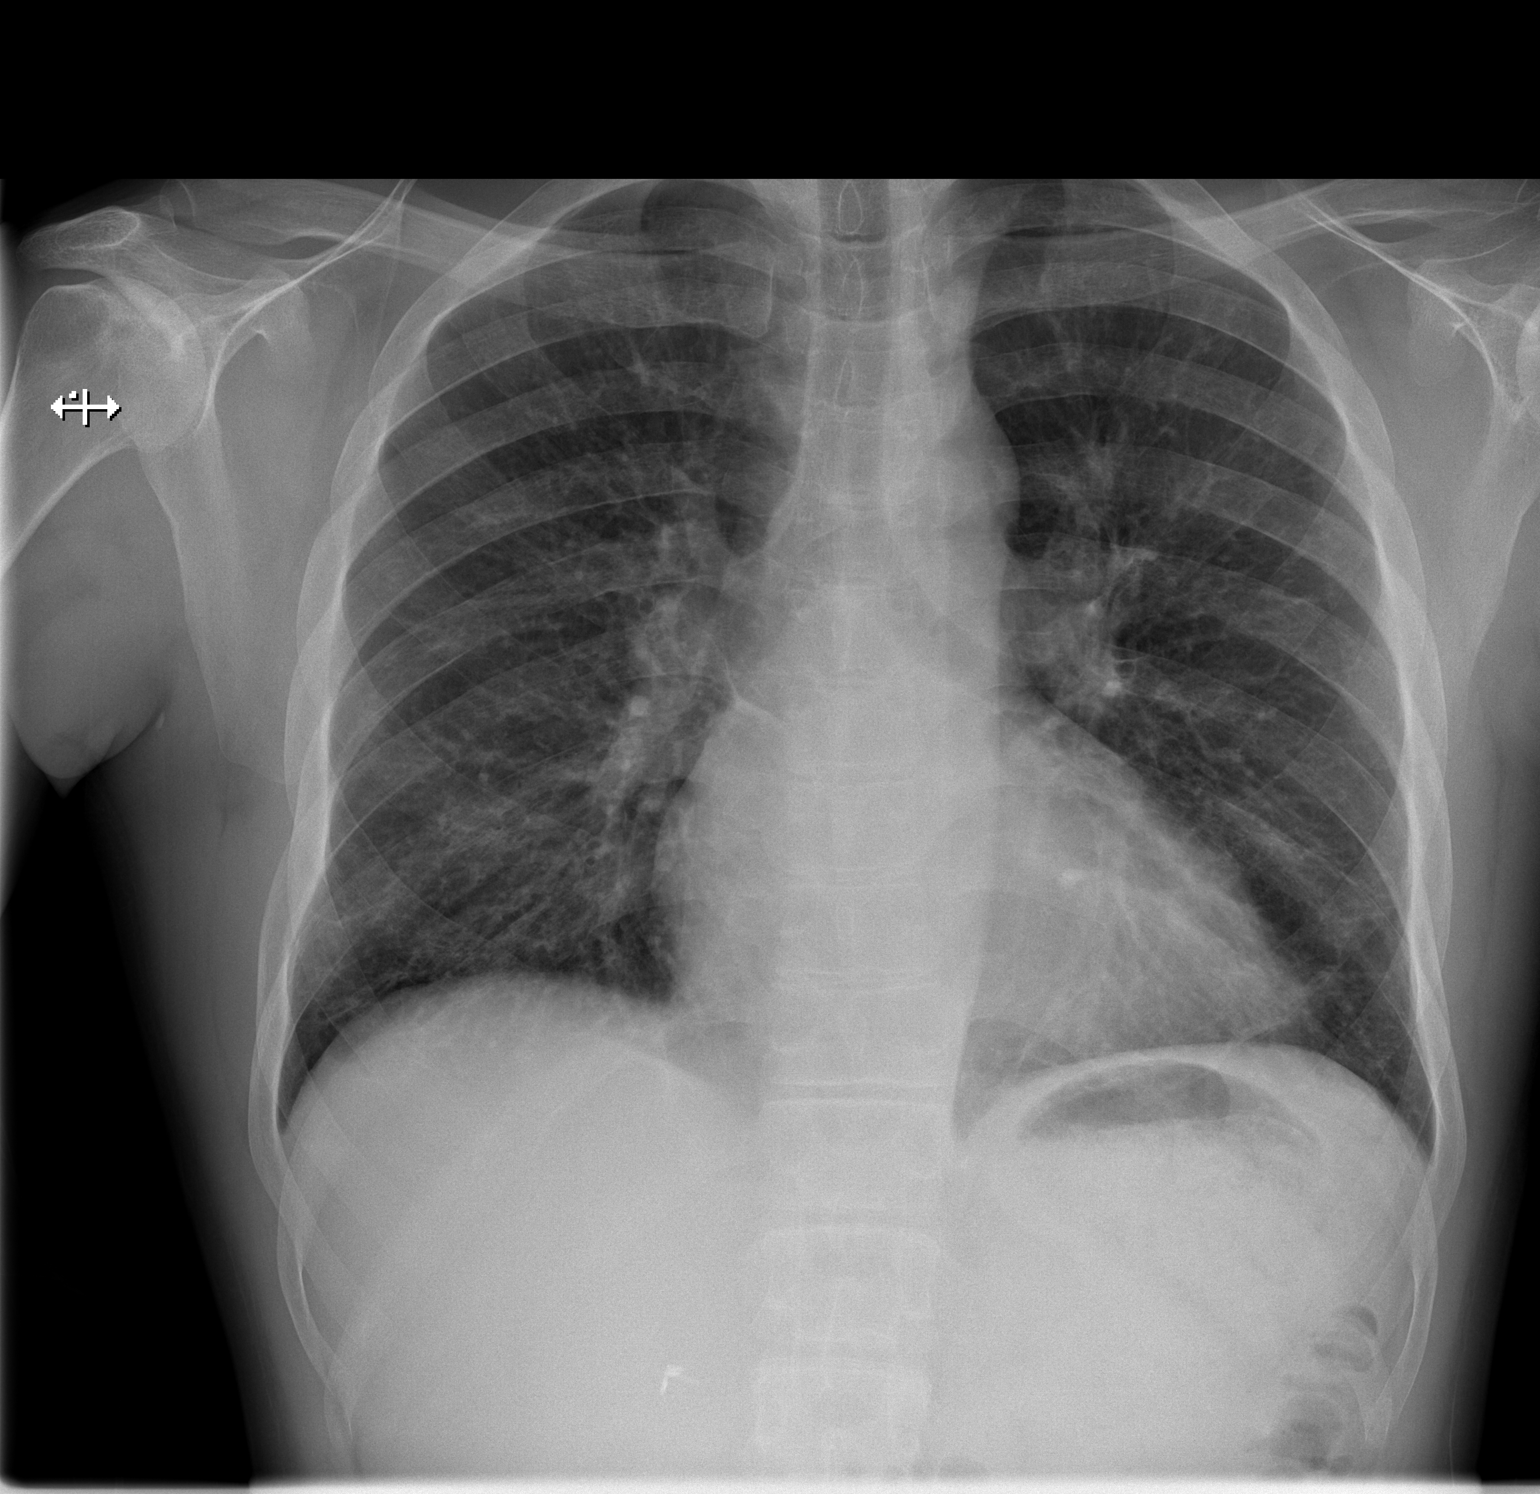

[w chest pa (2 of 2)]
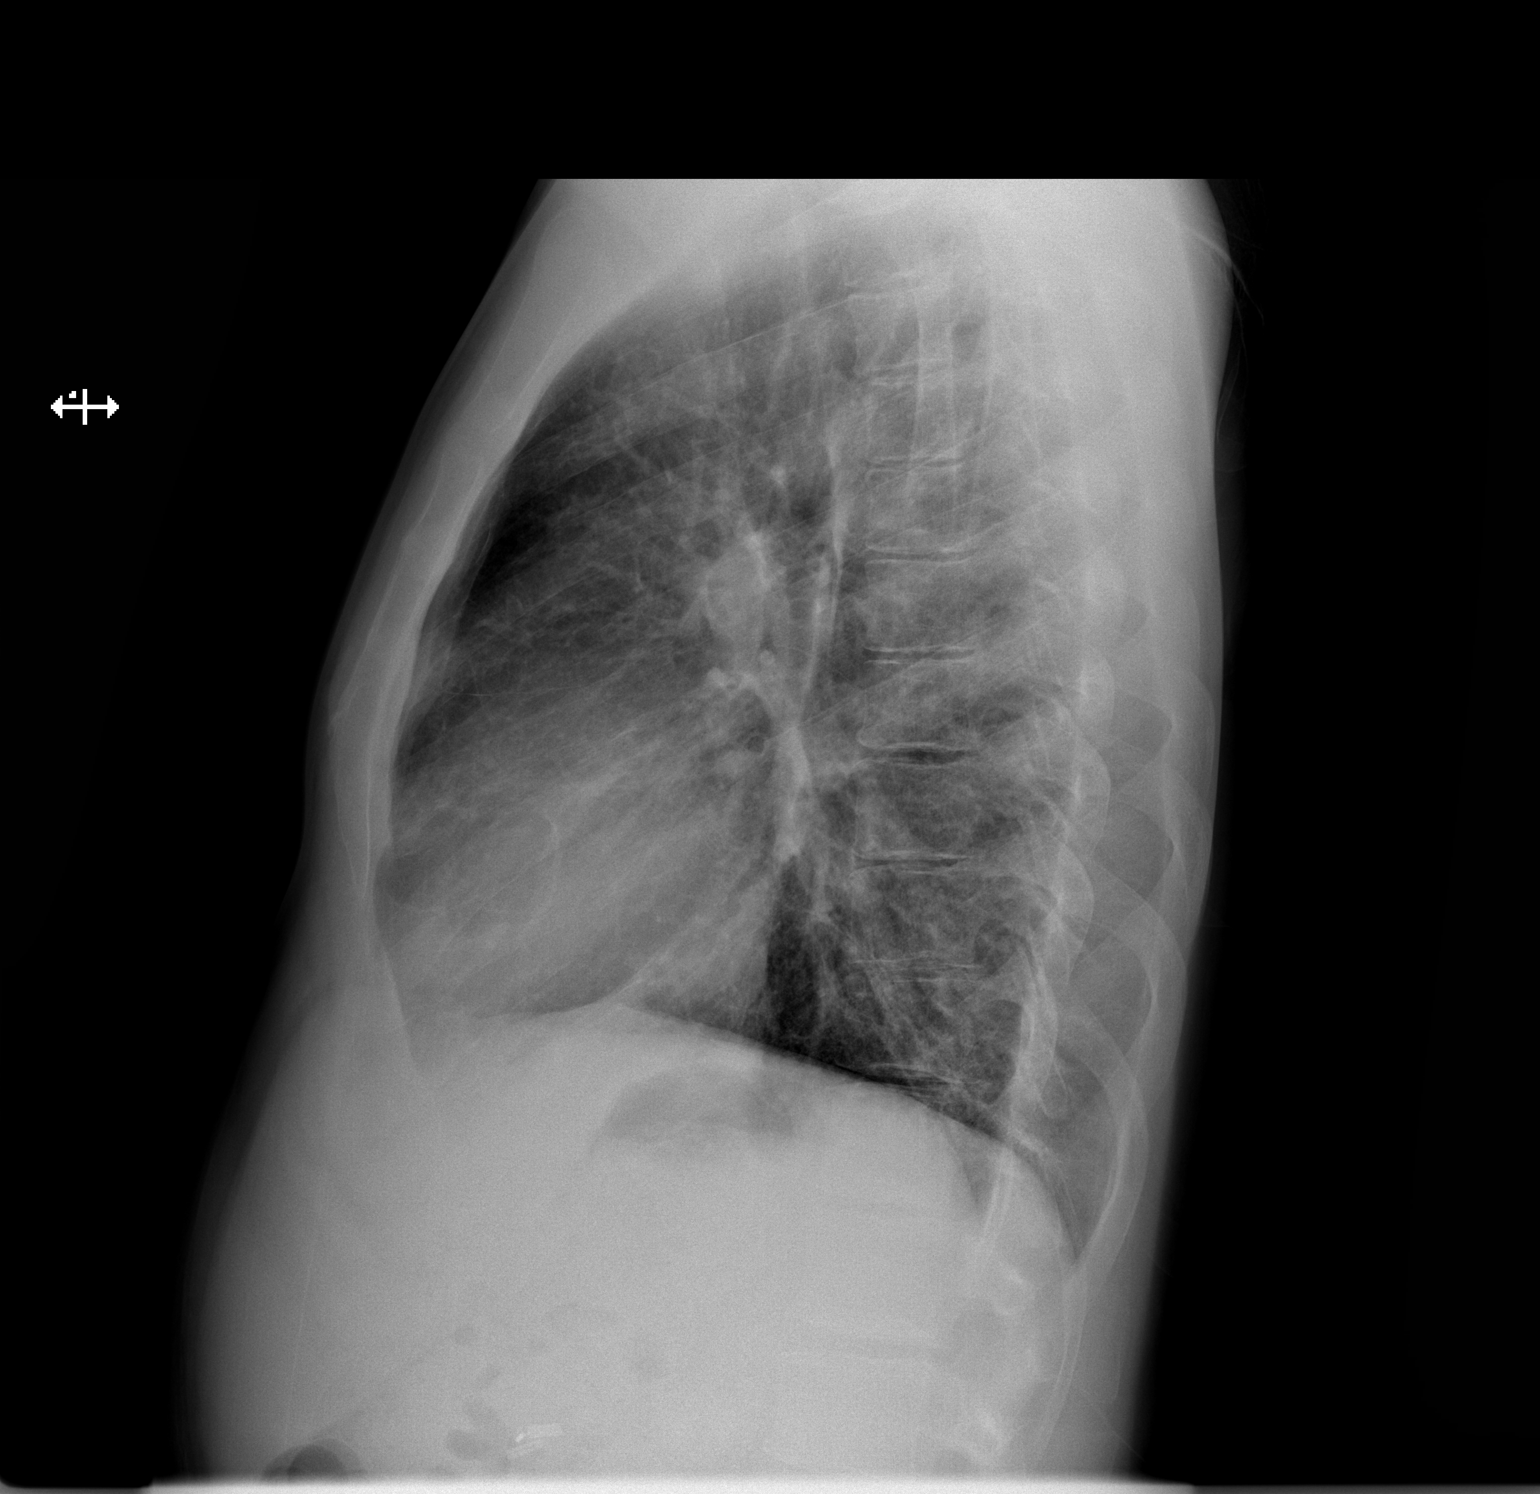

[2 of 2 positions shown; findings below may reference images not displayed]

FINDINGS: Chronic cardiomegaly.  Interstitial markings are
accentuated with tiny Kerley B lines at the bases suggesting mild
interstitial edema.  No effusions.  No osseous abnormality.
IMPRESSION: Mild cardiomegaly with probable slight interstitial edema.

## 2013-11-08 IMAGING — CT CT ANGIO CHEST
1 of 2 series · 19 of 32 positions shown · IV contrast (APPLIED)
Comparison: Prior CT chest 12/12/2009.

CLINICAL DATA: Chest pain today.  Elevated D-dimer. Sickle cell
disease.

CT ANGIOGRAPHY CHEST WITH CONTRAST
TECHNIQUE: Multidetector CT imaging of the chest was performed
using the standard protocol during bolus administration of
intravenous contrast.  Multiplanar CT image reconstructions
including MIPs were obtained to evaluate the vascular anatomy.
Contrast: 100mL OMNIPAQUE IOHEXOL 300 MG/ML IV SOLN

[Series 7: thins for pacs · axial · 0.64mm/px · z∈[+1398,+1644]mm · 19 of 274 slices shown]
[im 14/274  lung]
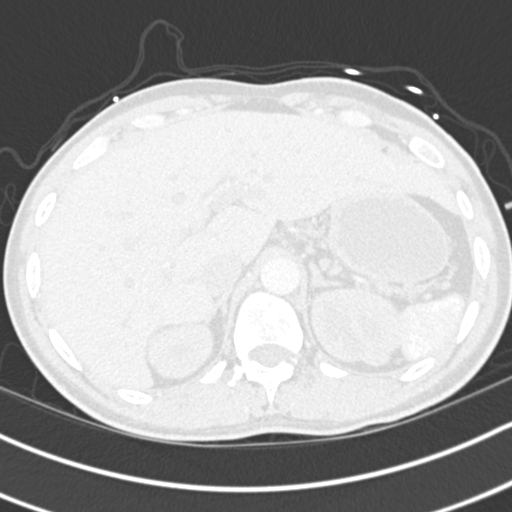
[im 28/274  mediastinal]
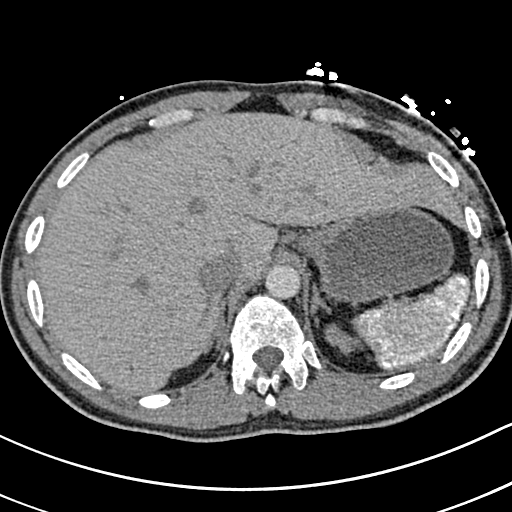
[im 41/274  lung]
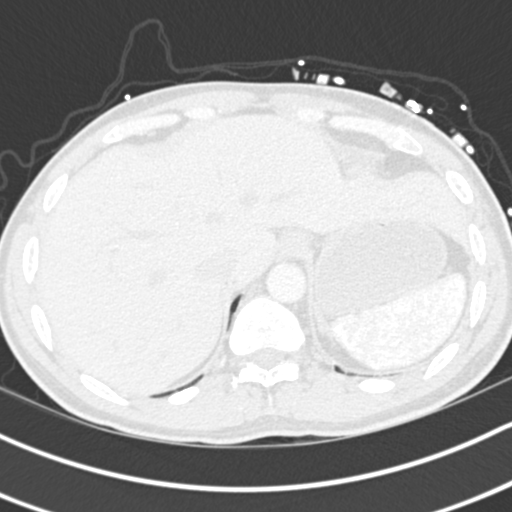
[im 69/274  mediastinal]
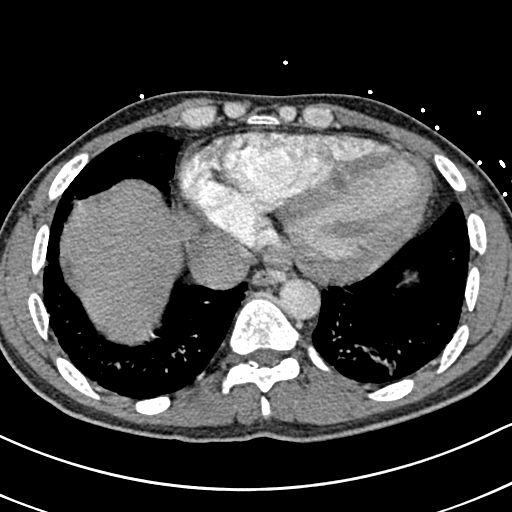
[im 82/274  lung]
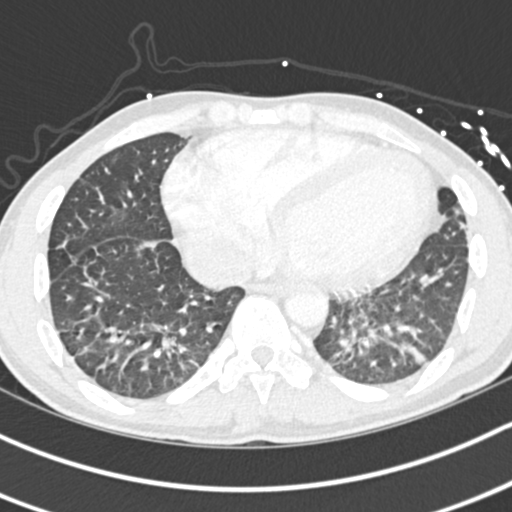
[im 92/274  mediastinal]
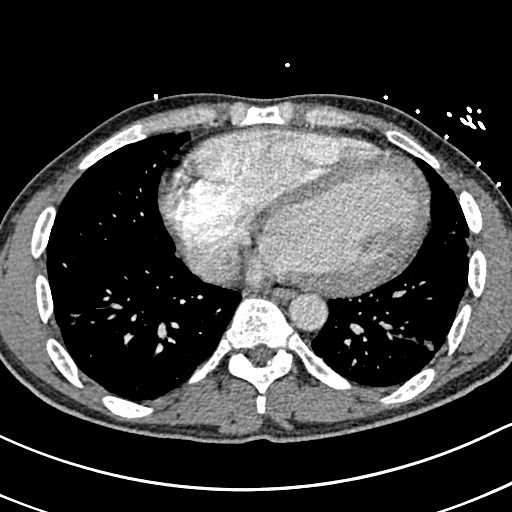
[im 96/274  lung]
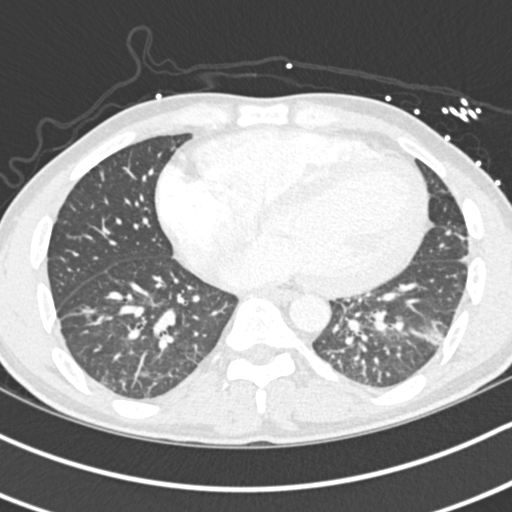
[im 110/274  mediastinal]
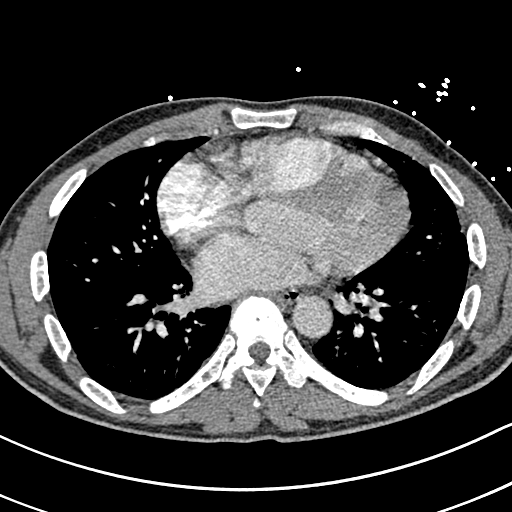
[im 123/274  lung]
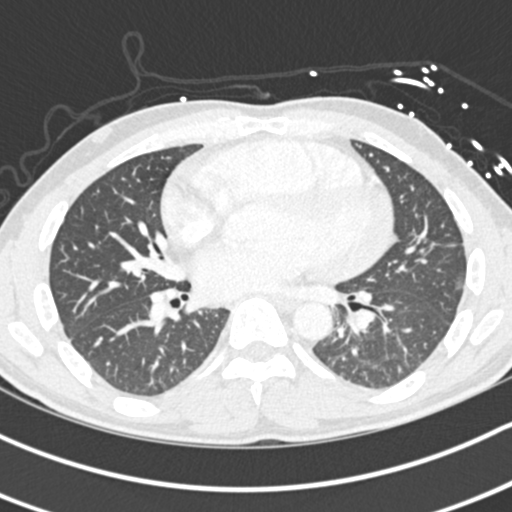
[im 137/274  mediastinal]
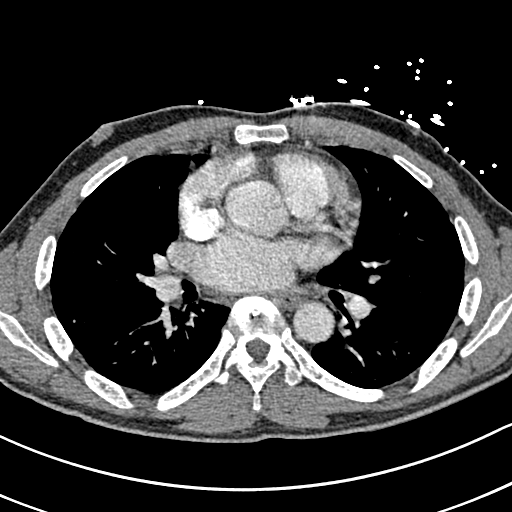
[im 151/274  lung]
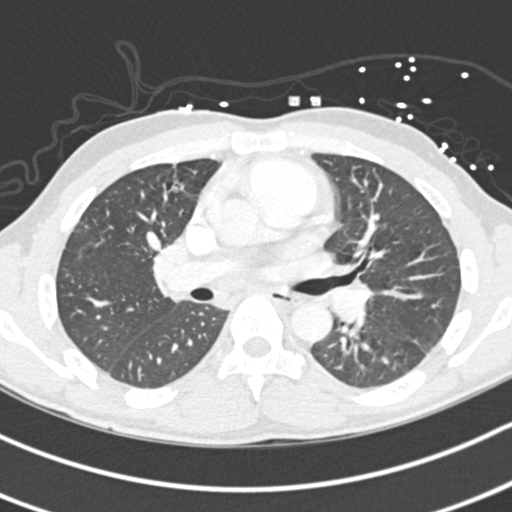
[im 164/274  mediastinal]
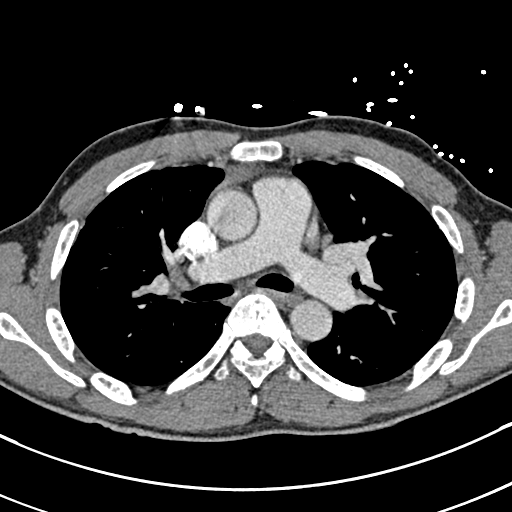
[im 178/274  lung]
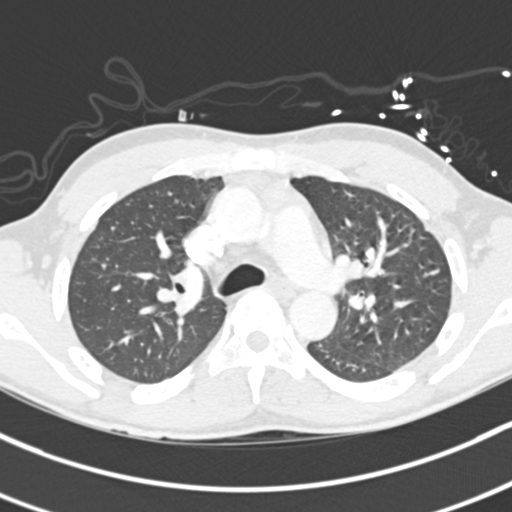
[im 183/274  mediastinal]
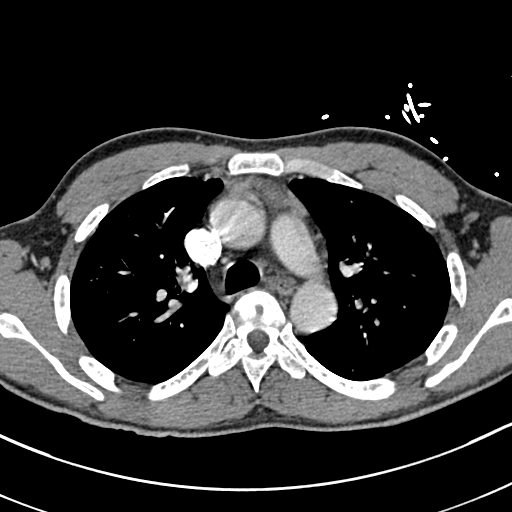
[im 192/274  lung]
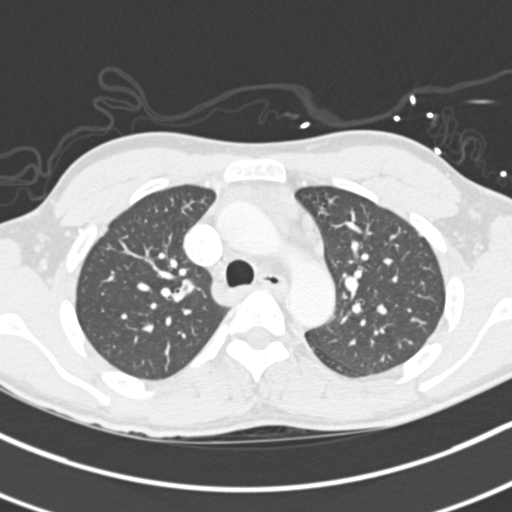
[im 205/274  mediastinal]
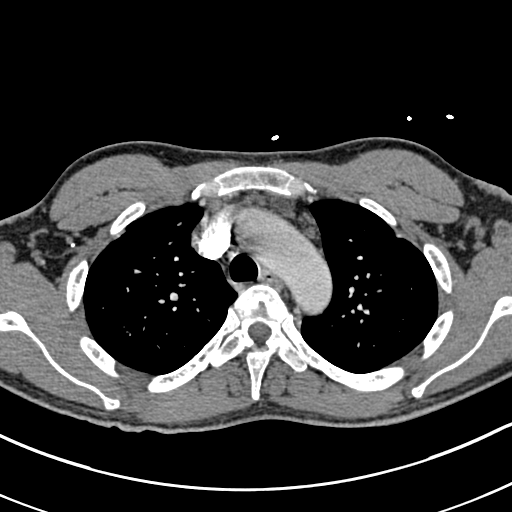
[im 233/274  lung]
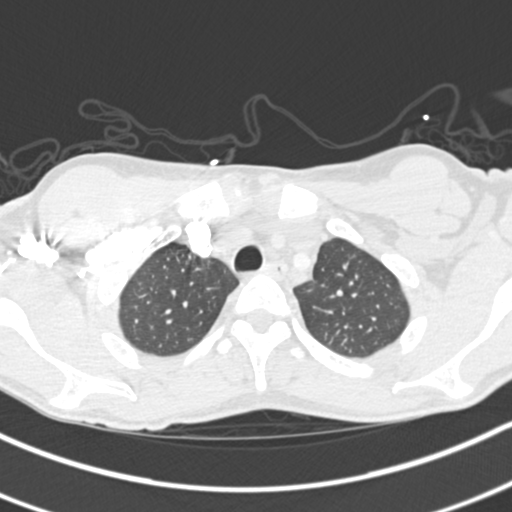
[im 246/274  mediastinal]
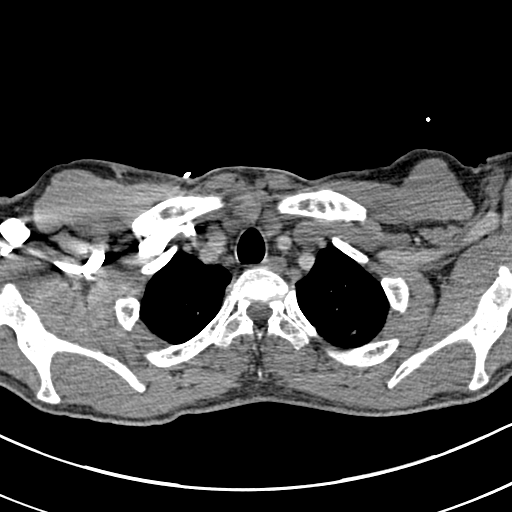
[im 260/274  lung]
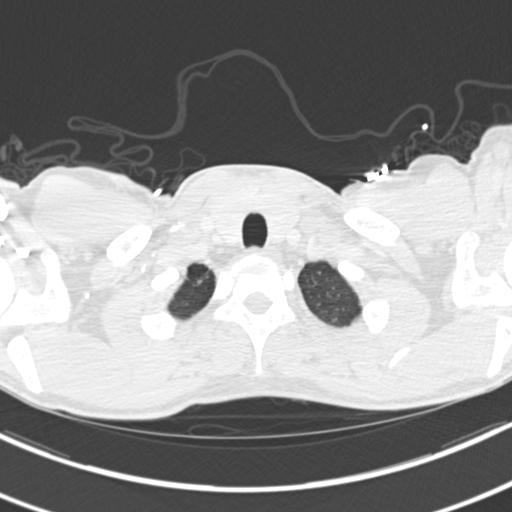

[19 of 32 positions shown; findings below may reference images not displayed]

FINDINGS: There is suboptimal opacification of the subsegmental
pulmonary arteries.  Given this limitation, there are no filling
defects seen in the central or segmental pulmonary branches to
suggest pulmonary emboli.  Proximal pulmonary arteries remain
enlarged, suggesting early pulmonary artery hypertension.  The
heart is also enlarged.  There is no pericardial or pleural
effusion. Unremarkable aorta and great vessels.

Note, pulmonary infiltrates or infarcts.  Mild atelectasis left
base.  Mild mediastinal adenopathy similar to priors.  No hilar
adenopathy or pulmonary masses.

Heavily calcified spleen is small, suggesting autosplenectomy.
Normal appearing kidneys and liver.  Osseous structures demonstrate
central endplate depression consistent with infarction.  Coarsening
of the vertebral body trabeculae reflects anemia.

Review of the MIP images confirms the above findings.
IMPRESSION: Suboptimal opacification of the distal pulmonary artery radicles,
but no central pulmonary embolus is observed.

Early pulmonary arterial hypertension, with cardiomegaly.

Spleen is heavily calcified and small in size suggesting
autosplenectomy.

## 2013-11-14 ENCOUNTER — Ambulatory Visit: Payer: Self-pay | Admitting: Internal Medicine

## 2013-11-14 NOTE — Discharge Summary (Signed)
Patient admitted to the day hospital with sickle cell painful crisis. Was treated with IV Dilaudid, hydration as well as toradol. At the end of stay, he was doing much better and was ready for discharge. He was discharged in good health.

## 2013-11-19 ENCOUNTER — Other Ambulatory Visit: Payer: Medicare Other

## 2013-11-19 ENCOUNTER — Other Ambulatory Visit: Payer: Self-pay

## 2013-11-19 ENCOUNTER — Telehealth (HOSPITAL_COMMUNITY): Payer: Self-pay | Admitting: Hematology

## 2013-11-19 ENCOUNTER — Telehealth: Payer: Self-pay | Admitting: Internal Medicine

## 2013-11-19 ENCOUNTER — Non-Acute Institutional Stay (HOSPITAL_COMMUNITY)
Admission: AD | Admit: 2013-11-19 | Discharge: 2013-11-19 | Disposition: A | Discharge status: Home / Self Care | Payer: Medicare Other | Attending: Internal Medicine | Admitting: Internal Medicine

## 2013-11-19 ENCOUNTER — Encounter (HOSPITAL_COMMUNITY): Payer: Self-pay | Admitting: Hematology

## 2013-11-19 ENCOUNTER — Other Ambulatory Visit: Payer: Self-pay | Admitting: Internal Medicine

## 2013-11-19 DIAGNOSIS — Z87891 Personal history of nicotine dependence: Secondary | ICD-10-CM | POA: Diagnosis not present

## 2013-11-19 DIAGNOSIS — F39 Unspecified mood [affective] disorder: Secondary | ICD-10-CM | POA: Diagnosis not present

## 2013-11-19 DIAGNOSIS — D5700 Hb-SS disease with crisis, unspecified: Secondary | ICD-10-CM | POA: Insufficient documentation

## 2013-11-19 DIAGNOSIS — Z79891 Long term (current) use of opiate analgesic: Secondary | ICD-10-CM

## 2013-11-19 DIAGNOSIS — Z0289 Encounter for other administrative examinations: Secondary | ICD-10-CM | POA: Diagnosis not present

## 2013-11-19 DIAGNOSIS — F1290 Cannabis use, unspecified, uncomplicated: Secondary | ICD-10-CM | POA: Insufficient documentation

## 2013-11-19 DIAGNOSIS — I10 Essential (primary) hypertension: Secondary | ICD-10-CM | POA: Insufficient documentation

## 2013-11-19 DIAGNOSIS — D649 Anemia, unspecified: Secondary | ICD-10-CM | POA: Diagnosis not present

## 2013-11-19 DIAGNOSIS — D473 Essential (hemorrhagic) thrombocythemia: Secondary | ICD-10-CM | POA: Diagnosis not present

## 2013-11-19 DIAGNOSIS — D571 Sickle-cell disease without crisis: Secondary | ICD-10-CM

## 2013-11-19 DIAGNOSIS — Z7901 Long term (current) use of anticoagulants: Secondary | ICD-10-CM | POA: Diagnosis not present

## 2013-11-19 LAB — CBC WITH DIFFERENTIAL/PLATELET
Basophils Absolute: 0.2 10*3/uL — ABNORMAL HIGH (ref 0.0–0.1)
Basophils Relative: 1 % (ref 0–1)
EOS PCT: 2 % (ref 0–5)
Eosinophils Absolute: 0.3 10*3/uL (ref 0.0–0.7)
HEMATOCRIT: 20.4 % — AB (ref 39.0–52.0)
HEMOGLOBIN: 6.9 g/dL — AB (ref 13.0–17.0)
Lymphocytes Relative: 11 % — ABNORMAL LOW (ref 12–46)
Lymphs Abs: 1.7 10*3/uL (ref 0.7–4.0)
MCH: 32.7 pg (ref 26.0–34.0)
MCHC: 33.8 g/dL (ref 30.0–36.0)
MCV: 96.7 fL (ref 78.0–100.0)
MONOS PCT: 9 % (ref 3–12)
Monocytes Absolute: 1.4 10*3/uL — ABNORMAL HIGH (ref 0.1–1.0)
NEUTROS ABS: 11.7 10*3/uL — AB (ref 1.7–7.7)
Neutrophils Relative %: 77 % (ref 43–77)
Platelets: 589 10*3/uL — ABNORMAL HIGH (ref 150–400)
RBC: 2.11 MIL/uL — AB (ref 4.22–5.81)
RDW: 20.5 % — ABNORMAL HIGH (ref 11.5–15.5)
WBC: 15.3 10*3/uL — AB (ref 4.0–10.5)

## 2013-11-19 LAB — BASIC METABOLIC PANEL
ANION GAP: 12 (ref 5–15)
BUN: 8 mg/dL (ref 6–23)
CO2: 23 meq/L (ref 19–32)
Calcium: 9 mg/dL (ref 8.4–10.5)
Chloride: 102 mEq/L (ref 96–112)
Creatinine, Ser: 0.56 mg/dL (ref 0.50–1.35)
GFR calc Af Amer: >90 mL/min (ref >90)
Glucose, Bld: 101 mg/dL — ABNORMAL HIGH (ref 70–99)
POTASSIUM: 4 meq/L (ref 3.7–5.3)
SODIUM: 137 meq/L (ref 137–147)

## 2013-11-19 LAB — RETICULOCYTES
RBC.: 2.11 MIL/uL — AB (ref 4.22–5.81)
RETIC CT PCT: 13.4 % — AB (ref 0.4–3.1)
Retic Count, Absolute: 282.7 10*3/uL — ABNORMAL HIGH (ref 19.0–186.0)

## 2013-11-19 LAB — LACTATE DEHYDROGENASE: LDH: 587 U/L — AB (ref 94–250)

## 2013-11-19 MED ORDER — SODIUM CHLORIDE 0.9 % IJ SOLN: 10.0000 mL | INTRAMUSCULAR | Status: DC | PRN

## 2013-11-19 MED ORDER — LISINOPRIL 10 MG PO TABS: 10.0000 mg | ORAL_TABLET | Freq: Every day | ORAL | Status: DC

## 2013-11-19 MED ORDER — HYDROMORPHONE HCL 2 MG/ML IJ SOLN
2.5000 mg | Freq: Once | INTRAMUSCULAR | Status: AC
  Administered 2013-11-19: 2.5 mg via INTRAVENOUS
  Filled 2013-11-19: qty 2

## 2013-11-19 MED ORDER — HYDROMORPHONE HCL 2 MG/ML IJ SOLN
2.0000 mg | Freq: Once | INTRAMUSCULAR | Status: AC
  Administered 2013-11-19: 2 mg via INTRAVENOUS
  Filled 2013-11-19: qty 1

## 2013-11-19 MED ORDER — HEPARIN SOD (PORK) LOCK FLUSH 100 UNIT/ML IV SOLN
500.0000 [IU] | INTRAVENOUS | Status: DC | PRN
  Filled 2013-11-19: qty 5

## 2013-11-19 MED ORDER — ONDANSETRON HCL 4 MG/2ML IJ SOLN: 4.0000 mg | Freq: Four times a day (QID) | INTRAMUSCULAR | Status: DC | PRN

## 2013-11-19 MED ORDER — DEXTROSE-NACL 5-0.45 % IV SOLN
INTRAVENOUS | Status: DC
  Administered 2013-11-19: 11:00:00 via INTRAVENOUS

## 2013-11-19 MED ORDER — SODIUM CHLORIDE 0.9 % IJ SOLN: 9.0000 mL | INTRAMUSCULAR | Status: DC | PRN

## 2013-11-19 MED ORDER — NALOXONE HCL 0.4 MG/ML IJ SOLN: 0.4000 mg | INTRAMUSCULAR | Status: DC | PRN

## 2013-11-19 MED ORDER — KETOROLAC TROMETHAMINE 30 MG/ML IJ SOLN
30.0000 mg | Freq: Once | INTRAMUSCULAR | Status: AC
  Administered 2013-11-19: 30 mg via INTRAVENOUS
  Filled 2013-11-19: qty 1

## 2013-11-19 MED ORDER — HYDROMORPHONE 2 MG/ML HIGH CONCENTRATION IV PCA SOLN
INTRAVENOUS | Status: DC
  Administered 2013-11-19: 4.9 mg via INTRAVENOUS
  Administered 2013-11-19: 12:00:00 via INTRAVENOUS
  Administered 2013-11-19: 5.6 mg via INTRAVENOUS
  Filled 2013-11-19: qty 25

## 2013-11-19 MED ORDER — HYDROMORPHONE HCL 2 MG/ML IJ SOLN
1.7500 mg | Freq: Once | INTRAMUSCULAR | Status: AC
  Administered 2013-11-19: 1.8 mg via INTRAVENOUS
  Filled 2013-11-19: qty 1

## 2013-11-19 MED ORDER — DIPHENHYDRAMINE HCL 25 MG PO CAPS
25.0000 mg | ORAL_CAPSULE | ORAL | Status: DC | PRN
  Administered 2013-11-19: 50 mg via ORAL
  Filled 2013-11-19: qty 2

## 2013-11-19 MED ORDER — HYDROMORPHONE HCL 4 MG PO TABS
4.0000 mg | ORAL_TABLET | Freq: Once | ORAL | Status: AC
  Administered 2013-11-19: 4 mg via ORAL
  Filled 2013-11-19: qty 1

## 2013-11-19 NOTE — Telephone Encounter (Signed)
pt called c/o bilateral flank pain, 7/10 on pain scale. Pt denies any other symptoms. Will notify MD.

## 2013-11-19 NOTE — Progress Notes (Signed)
Patient ID: [name], male   DOB: [birth year], 33 y.o.   MRN: [id number] Pt discharged ambulatory. Pt a&ox4, pt in no distress at time of discharge. Port-a-cath flushes, packed with heparin and deaccessed per policy. Pt received discharge insstructions, pt verbalized understanding. Pt denies any further needs at this time. Pt denies driving to [facility], pt ambulated to front lobby to wait for ride.

## 2013-11-19 NOTE — Telephone Encounter (Signed)
Pt accepted by Dr. Stann Mainland. Pt informed, will come to day hospital "within an hour or so", for treatment.

## 2013-11-19 NOTE — Telephone Encounter (Signed)
Refill of lisinopril 10 mg PO medication; no refills; pt to call primary clinic to make appointment for office visit

## 2013-11-19 NOTE — Discharge Summary (Signed)
Physician Discharge Summary  [name] XLK:[id number] DOB: 13-Sep-1980 DOA: 11/19/2013  PCP: Glenisha Gundry A., MD  Admit date: 11/19/2013 Discharge date: 11/19/2013  Discharge Diagnoses:  Active Problems: Hb SS with crisis.   Discharge Condition: Good  Disposition: Home  Follow-up Information   Follow up with Brae Schaafsma A., MD. Schedule an appointment as soon as possible for a visit in 1 week.   Specialty:  Internal Medicine   Contact information:   [place] [address] [phone]       Diet: Low Sodium   Wt Readings from Last 3 Encounters:  11/07/13 156 lb (70.761 kg)  10/30/13 156 lb (70.761 kg)  10/17/13 163 lb (73.936 kg)    History of present illness:  Pt with Hb SS well known to [place] here with pain characteristic of SCD. He reports that the pain came on 2 days ago and was not controlled with oral analgesics. He describes pain as throbbing in nature. He denies any associated symptoms. Pt continues to smoke which likely contributes to his symptoms. He reports compliance with Hydrea however his MCV is not reflective of compliance. He had an appointment to review his medications for effectiveness and oversight. Pt did not keep his appointment he reports due to his phone not working and inability to call hsi transportation to get him here.   Hospital Course:  Active Problems:  1. Hb SS with crisis: Pt was treated initially with weight based Dilaudid by rapid re-dosing IVF and Toradol . The pain remained above 7/10, and Pt was transitioned to a weight based Dilaudid PCA . Pain decreased to 4/10 and pt was given a dose of Oral Dilaudid 4 mg . He was observed for 1 hours and pain remained at 3-4/10. He was discharged in good condition and is to follow-up in clinic in 1 week. 2. Anemia: Pt continues to have anemia. Recent Iron studies are not conisitent with iron deficiency.    Discharge Exam:  Filed Vitals:   11/19/13 1658  BP:  142/83  Pulse: 92  Temp: 98.4 F (36.9 C)  Resp: 18   Filed Vitals:   11/19/13 1412 11/19/13 1512 11/19/13 1612 11/19/13 1658  BP: 115/67 123/70 126/78 142/83  Pulse: 80 83 76 92  Temp:    98.4 F (36.9 C)  TempSrc:    Oral  Resp: 20 16 15 18   SpO2: 94% 96% 93% 94%    General: Alert, awake, oriented x3, in no acute distress.  HEENT: [place]/AT PEERL, EOMI, anicteric Neck: Trachea midline,  no masses, no thyromegal,y no JVD, no carotid bruit OROPHARYNX:  Moist, No exudate/ erythema/lesions.  Heart: Regular rate and rhythm, without murmurs, rubs, gallops.  Lungs: Clear to auscultation, no wheezing or rhonchi noted. Abdomen: Soft, nontender, nondistended, positive bowel sounds, no masses no hepatosplenomegaly noted.  Neuro: No focal neurological deficits noted cranial nerves II through XII grossly intact.  Strength normal in bilateral upper and lower extremities. Musculoskeletal: No warm swelling or erythema around joints, no spinal tenderness noted. Psychiatric: Patient alert and oriented x3, good insight and cognition, good recent to remote recall. Lymph node survey: No cervical axillary or inguinal lymphadenopathy noted.   Discharge Instructions  Discharge Instructions   Activity as tolerated - No restrictions    Complete by:  As directed      Diet - low sodium heart healthy    Complete by:  As directed             Medication  List    TAKE these medications       folic acid 1 MG tablet  Commonly known as:  FOLVITE  Take 1 tablet (1 mg total) by mouth every morning.     HYDROmorphone 4 MG tablet  Commonly known as:  DILAUDID  Take 1 tablet (4 mg total) by mouth every 4 (four) hours as needed for severe pain.     hydroxyurea 500 MG capsule  Commonly known as:  HYDREA  Take 3 capsules (1,500 mg total) by mouth daily. May take with food to minimize GI side effects.     metoprolol tartrate 25 MG tablet  Commonly known as:  LOPRESSOR  Take 1 tablet (25 mg total) by  mouth daily.     morphine 30 MG 12 hr tablet  Commonly known as:  MS CONTIN  Take 1 tablet (30 mg total) by mouth every 12 (twelve) hours.     potassium chloride SA 20 MEQ tablet  Commonly known as:  K-DUR,KLOR-CON  Take 1 tablet (20 mEq total) by mouth every morning.     rivaroxaban 20 MG Tabs tablet  Commonly known as:  XARELTO  Take 1 tablet (20 mg total) by mouth every morning.     zolpidem 10 MG tablet  Commonly known as:  AMBIEN  Take 1 tablet (10 mg total) by mouth at bedtime as needed for sleep.      ASK your doctor about these medications       lisinopril 10 MG tablet  Commonly known as:  PRINIVIL,ZESTRIL  Take 1 tablet (10 mg total) by mouth daily.  Ask about: Which instructions should I use?          The results of significant diagnostics from this hospitalization (including imaging, microbiology, ancillary and laboratory) are listed below for reference.    Significant Diagnostic Studies: Dg Chest 2 View  10/30/2013   CLINICAL DATA:  Difficulty breathing and chest pain; sickle cell disease  EXAM: CHEST  2 VIEW  COMPARISON:  October 27, 2013  FINDINGS: Port-A-Cath tip is at the cavoatrial junction. No pneumothorax. Areas of mild scarring are noted bilaterally. There is no edema or consolidation. Heart is borderline enlarged with pulmonary vascularity showing mild pulmonary venous hypertension. No adenopathy. There are no appreciable bone lesions.  IMPRESSION: Areas of mild scarring bilaterally. Evidence of mild pulmonary venous congestion, a finding common with sickle cell disease. No frank edema or consolidation.   Electronically Signed   By: Lowella Grip M.D.   On: 10/30/2013 12:16   Dg Chest 2 View  10/27/2013   CLINICAL DATA:  Chest pain and history of sickle cell anemia. Recent fall.  EXAM: CHEST  2 VIEW  COMPARISON:  Chest radiograph 10/07/2013  FINDINGS: There is a left subclavian Port-A-Cath. Catheter tip is in the lower SVC and stable. Negative for a  pneumothorax. There are stable patchy linear densities which could represent areas of scarring. No evidence for focal airspace disease. Heart size is upper limits of normal. No pleural effusions.  IMPRESSION: Patchy parenchymal lung densities are most compatible with chronic changes. No acute airspace disease or edema.   Electronically Signed   By: Markus Daft M.D.   On: 10/27/2013 08:27   Dg Hip Complete Right  10/27/2013   CLINICAL DATA:  Sickle cell anemia. Fall and right groin pain. History of right hip replacement for avascular necrosis.  EXAM: RIGHT HIP - COMPLETE 2+ VIEW  COMPARISON:  11/13/2010  FINDINGS: Patient has a right hip  arthroplasty. The hip arthroplasty is located without complicating features. There is a small gap between the prosthesis and the femur intertrochanteric region but unclear if this represents an interval change. No significant lucency around the femoral stem. Prosthesis appears to be well seated in the femoral bone on the lateral view. Pelvic bony ring is intact. Stool in the rectal region.  IMPRESSION: No acute bone abnormality in the pelvis or right hip.  Right hip arthroplasty without complicating features.   Electronically Signed   By: Markus Daft M.D.   On: 10/27/2013 08:33   Ct Hip Right Wo Contrast  10/27/2013   CLINICAL DATA:  RIGHT hip pain post fall, history sickle cell disease, prior avascular necrosis RIGHT hip, RIGHT hip replacement  EXAM: CT OF THE RIGHT HIP WITHOUT CONTRAST  TECHNIQUE: Multidetector CT imaging of the right hip was performed according to the standard protocol. Multiplanar CT image reconstructions were also generated.  COMPARISON:  RIGHT hip radiographs 10/27/2013  FINDINGS: Severe artifacts secondary to metallic components of RIGHT hip prosthesis.  Visualized pelvis grossly intact.  No definite acute fracture of dislocation seen within limitations of metallic artifacts.  Bones appear demineralized.  No significant periprosthetic lucency.  Nutrient  foramen seen at the posterior aspect of the femoral diaphysis at the level of the distal portion of the femoral component.  No definite acute osseous abnormalities identified.  Plate and screws seen at the posterior column RIGHT acetabular post ORIF.  Intrapelvic soft tissues unremarkable.  IMPRESSION: RIGHT hip prosthesis.  No definite acute osseous findings.   Electronically Signed   By: Lavonia Dana M.D.   On: 10/27/2013 16:27    Microbiology: No results found for this or any previous visit (from the past 240 hour(s)).   Labs: Basic Metabolic Panel:  Recent Labs Lab 11/19/13 1024  NA 137  K 4.0  CL 102  CO2 23  GLUCOSE 101*  BUN 8  CREATININE 0.56  CALCIUM 9.0   Liver Function Tests: No results found for this basename: AST, ALT, ALKPHOS, BILITOT, PROT, ALBUMIN,  in the last 168 hours No results found for this basename: LIPASE, AMYLASE,  in the last 168 hours No results found for this basename: AMMONIA,  in the last 168 hours CBC:  Recent Labs Lab 11/19/13 1024  WBC 15.3*  NEUTROABS 11.7*  HGB 6.9*  HCT 20.4*  MCV 96.7  PLT 589*    Time coordinating discharge: 42 minutes  Signed:  Monita Swier A.  11/19/2013, 5:31 PM

## 2013-11-19 NOTE — H&P (Signed)
[name] History and Physical  [name]:[id number] DOB: [birth year] DOA: 11/19/2013   PCP: [name],[name], MD   Chief Complaint: Pain in sides x 3 days  HPI: Pt with Hb SS well known to [place] here with pain characteristic of SCD. He reports that the pain came on 2 days ago and was not controlled with oral analgesics. He describes pain as throbbing in nature. He denies any associated symptoms. Pt continues to smoke which likely contributes to his symptoms. He reports compliance with Hydrea however his MCV is not reflective of compliance. He had an appointment to review his medications for effectiveness and oversight. Pt did not keep his appointment he reports due to his phone not working and inability to call hsi transportation to get him here.   Review of Systems:  Constitutional: No weight loss, night sweats, Fevers, chills, fatigue.  HEENT: No headaches, dizziness, seizures, vision changes, difficulty swallowing,Tooth/dental problems,Sore throat, No sneezing, itching, ear ache, nasal congestion, post nasal drip,  Cardio-vascular: No chest pain, Orthopnea, PND, swelling in lower extremities, anasarca, dizziness, palpitations  GI: No heartburn, indigestion, abdominal pain, nausea, vomiting, diarrhea, change in bowel habits, loss of appetite  Resp: No shortness of breath with exertion or at rest. No excess mucus, no productive cough, No non-productive cough, No coughing up of blood.No change in color of mucus.No wheezing.No chest wall deformity  Skin: no rash or lesions.  GU: no dysuria, change in color of urine, no urgency or frequency. No flank pain.  Psych: No change in mood or affect. No depression or anxiety. No memory loss.    Past Medical History  Diagnosis Date  . Sickle cell anemia   . Blood transfusion   . 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 Hypokalemia   . Mood disorder   . History of pulmonary embolus (PE)   .  Avascular necrosis   . Leukocytosis     Chronic  . Thrombocytosis     Chronic  . Hypertension   . History of Clostridium difficile infection   . Uses marijuana   . Chronic anticoagulation   . Functional asplenia   . Former smoker   . Second hand tobacco smoke exposure   . Alcohol consumption of one to four drinks per day    Past Surgical History  Procedure Laterality Date  . Right hip replacement      08/2006  . Cholecystectomy      01/2008  . Porta cath placement    . Porta cath removal    . Umbilical hernia repair      01/2008  . Excision of left periauricular cyst      10/2009  . Excision of right ear lobe cyst with primary closur      11/2007  . Portacath placement  01/05/2012    Procedure: INSERTION PORT-A-CATH;  Surgeon: Odis Hollingshead, MD;  Location: [facility];  Service: General;  Laterality: N/A;  ultrasound guiced port a cath insertion with fluoroscopy   Social History:  reports that he quit smoking about 3 years ago. He has never used smokeless tobacco. He reports that he uses illicit drugs (Marijuana) about twice per week. He reports that he does not drink alcohol.  Allergies  Allergen Reactions  . Morphine And Related Hives and Rash    Pt states he can take PO, but not IV and he is able to tolerate Dilaudid with no reactions.    Family History  Problem Relation Age of Onset  . Sickle cell anemia Mother   . Sickle cell anemia Father   . Sickle cell trait Brother     Prior to Admission medications   Medication Sig Start Date End Date Taking? Authorizing Provider  folic acid (FOLVITE) 1 MG tablet Take 1 tablet (1 mg total) by mouth every morning. 10/08/13  Yes [name], MD  HYDROmorphone (DILAUDID) 4 MG tablet Take 1 tablet (4 mg total) by mouth every 4 (four) hours as needed for severe pain. 10/24/13  Yes Dorena Dew, FNP  hydroxyurea (HYDREA) 500 MG capsule Take 3 capsules (1,500 mg total) by mouth daily. May take with food to minimize GI side  effects. 10/30/13  Yes [name], MD  lisinopril (PRINIVIL,ZESTRIL) 10 MG tablet Take 1 tablet (10 mg total) by mouth daily. 10/24/13  Yes Dorena Dew, FNP  metoprolol tartrate (LOPRESSOR) 25 MG tablet Take 1 tablet (25 mg total) by mouth daily. 08/22/13  Yes [name], MD  morphine (MS CONTIN) 30 MG 12 hr tablet Take 1 tablet (30 mg total) by mouth every 12 (twelve) hours. 11/07/13  Yes Dorena Dew, FNP  potassium chloride SA (K-DUR,KLOR-CON) 20 MEQ tablet Take 1 tablet (20 mEq total) by mouth every morning. 09/25/13  Yes [name], MD  rivaroxaban (XARELTO) 20 MG TABS tablet Take 1 tablet (20 mg total) by mouth every morning. 10/24/13  Yes Dorena Dew, FNP  zolpidem (AMBIEN) 10 MG tablet Take 1 tablet (10 mg total) by mouth at bedtime as needed for sleep. 10/28/13  Yes Dorena Dew, FNP   Physical Exam: Filed Vitals:   11/19/13 1010  BP: 111/68  Pulse: 79  Temp: 98.8 F (37.1 C)  TempSrc: Oral  Resp: 18  SpO2: 97%   General: Alert, awake, oriented x3, in mild distress.  HEENT: [place]/AT PEERL, EOMI, anicteric Neck: Trachea midline,  no masses, no thyromegal,y no JVD, no carotid bruit OROPHARYNX:  Moist, No exudate/ erythema/lesions.  Heart: Regular rate and rhythm, without murmurs, rubs, gallops, PMI non-displaced, no heaves or thrills on palpation.  Lungs: Clear to auscultation, no wheezing or rhonchi noted.  Abdomen: Soft, nontender, nondistended, positive bowel sounds, no masses no hepatosplenomegaly noted..  Neuro: No focal neurological deficits noted cranial nerves II through XII grossly intact.   Labs on Admission:   Basic Metabolic Panel:  Recent Labs Lab 11/19/13 1024  NA 137  K 4.0  CL 102  CO2 23  GLUCOSE 101*  BUN 8  CREATININE 0.56  CALCIUM 9.0   Liver Function Tests: No results found for this basename: AST, ALT, ALKPHOS, BILITOT, PROT, ALBUMIN,  in the last 168 hours CBC:  Recent Labs Lab 11/19/13 1024  WBC 15.3*   NEUTROABS 11.7*  HGB 6.9*  HCT 20.4*  MCV 96.7  PLT 589*    Assessment/Plan: Active Problems: 1. Hb SS with crisis: Pt will be treated with initially treated with weight based rapid re-dosing IVF and Toradol . If pain is above 7/10, then Pt will be transitioned to a weight based Dilaudid PCA . Patient will be re-evaluated for pain in the context of function and relationship to baseline as care progresses.  2. Anemia: Pt continues to have anemia. Recent Iron studies are not conisitent with iron deficiency.     Time spend: 35 minutes Code Status: Full Code Family Communication: N/A  Disposition Plan: Not yet determined.  [name],[name], MD  Pager [phone]  If 7PM-7AM, please  contact night-coverage [email or web address] Password [email or web address] 11/19/2013, 11:49 AM

## 2013-11-19 NOTE — Telephone Encounter (Signed)
Request refill of Lisinopril 10 mg tablet

## 2013-11-22 LAB — OPIATES/OPIOIDS (LC/MS-MS)
CODEINE URINE: NEGATIVE ng/mL (ref <50)
Hydrocodone: NEGATIVE ng/mL (ref <50)
Hydromorphone: 365 ng/mL — AB (ref <50)
NORHYDROCODONE, UR: NEGATIVE ng/mL (ref <50)
NOROXYCODONE, UR: NEGATIVE ng/mL (ref <50)
Oxycodone, ur: NEGATIVE ng/mL (ref <50)
Oxymorphone: NEGATIVE ng/mL (ref <50)

## 2013-11-22 LAB — ZOLPIDEM (LC/MS-MS), URINE
Zolpidem (GC/LC/MS), Ur confirm: NEGATIVE ng/mL (ref <5)
Zolpidem metabolite (GC/LC/MS) Ur, confirm: 1310 ng/mL — AB (ref <5)

## 2013-11-22 LAB — CANNABANOIDS (GC/LC/MS), URINE: THC-COOH UR CONFIRM: 615 ng/mL — AB (ref <5)

## 2013-11-23 LAB — PRESCRIPTION MONITORING PROFILE (SOLSTAS)
Amphetamine/Meth: NEGATIVE ng/mL
BENZODIAZEPINE SCREEN, URINE: NEGATIVE ng/mL
Barbiturate Screen, Urine: NEGATIVE ng/mL
Buprenorphine, Urine: NEGATIVE ng/mL
COCAINE METABOLITES: NEGATIVE ng/mL
Carisoprodol, Urine: NEGATIVE ng/mL
Creatinine, Urine: 80.02 mg/dL (ref >20.0)
ECSTASY: NEGATIVE ng/mL
Fentanyl, Ur: NEGATIVE ng/mL
Meperidine, Ur: NEGATIVE ng/mL
Methadone Screen, Urine: NEGATIVE ng/mL
Nitrites, Initial: NEGATIVE ug/mL
Oxycodone Screen, Ur: NEGATIVE ng/mL
PH URINE, INITIAL: 7.2 pH (ref 4.5–8.9)
Propoxyphene: NEGATIVE ng/mL
TRAMADOL UR: NEGATIVE ng/mL
Tapentadol, urine: NEGATIVE ng/mL

## 2013-11-25 ENCOUNTER — Encounter (HOSPITAL_COMMUNITY): Payer: Self-pay | Admitting: Emergency Medicine

## 2013-11-25 ENCOUNTER — Emergency Department (HOSPITAL_COMMUNITY): Payer: Medicare Other

## 2013-11-25 ENCOUNTER — Inpatient Hospital Stay (HOSPITAL_COMMUNITY)
Admission: EM | Admit: 2013-11-25 | Discharge: 2013-11-28 | DRG: 812 | Disposition: A | Discharge status: Home / Self Care | Payer: Medicare Other | Attending: Internal Medicine | Admitting: Internal Medicine

## 2013-11-25 DIAGNOSIS — D5700 Hb-SS disease with crisis, unspecified: Secondary | ICD-10-CM | POA: Diagnosis not present

## 2013-11-25 DIAGNOSIS — J209 Acute bronchitis, unspecified: Secondary | ICD-10-CM | POA: Diagnosis present

## 2013-11-25 DIAGNOSIS — R0789 Other chest pain: Secondary | ICD-10-CM | POA: Diagnosis not present

## 2013-11-25 DIAGNOSIS — Z87891 Personal history of nicotine dependence: Secondary | ICD-10-CM

## 2013-11-25 DIAGNOSIS — Z96641 Presence of right artificial hip joint: Secondary | ICD-10-CM | POA: Diagnosis present

## 2013-11-25 DIAGNOSIS — R7989 Other specified abnormal findings of blood chemistry: Secondary | ICD-10-CM

## 2013-11-25 DIAGNOSIS — D638 Anemia in other chronic diseases classified elsewhere: Secondary | ICD-10-CM

## 2013-11-25 DIAGNOSIS — Z7901 Long term (current) use of anticoagulants: Secondary | ICD-10-CM | POA: Diagnosis not present

## 2013-11-25 DIAGNOSIS — E876 Hypokalemia: Secondary | ICD-10-CM | POA: Diagnosis present

## 2013-11-25 DIAGNOSIS — R072 Precordial pain: Secondary | ICD-10-CM

## 2013-11-25 DIAGNOSIS — R079 Chest pain, unspecified: Secondary | ICD-10-CM | POA: Diagnosis not present

## 2013-11-25 DIAGNOSIS — I272 Other secondary pulmonary hypertension: Secondary | ICD-10-CM | POA: Diagnosis present

## 2013-11-25 DIAGNOSIS — I10 Essential (primary) hypertension: Secondary | ICD-10-CM | POA: Diagnosis present

## 2013-11-25 DIAGNOSIS — Z86711 Personal history of pulmonary embolism: Secondary | ICD-10-CM | POA: Diagnosis not present

## 2013-11-25 DIAGNOSIS — Z79899 Other long term (current) drug therapy: Secondary | ICD-10-CM

## 2013-11-25 DIAGNOSIS — I270 Primary pulmonary hypertension: Secondary | ICD-10-CM | POA: Diagnosis not present

## 2013-11-25 DIAGNOSIS — D571 Sickle-cell disease without crisis: Secondary | ICD-10-CM | POA: Diagnosis not present

## 2013-11-25 DIAGNOSIS — D5701 Hb-SS disease with acute chest syndrome: Secondary | ICD-10-CM | POA: Diagnosis not present

## 2013-11-25 DIAGNOSIS — R778 Other specified abnormalities of plasma proteins: Secondary | ICD-10-CM

## 2013-11-25 DIAGNOSIS — R0602 Shortness of breath: Secondary | ICD-10-CM | POA: Diagnosis not present

## 2013-11-25 LAB — COMPREHENSIVE METABOLIC PANEL
ALK PHOS: 81 U/L (ref 39–117)
ALT: 18 U/L (ref 0–53)
AST: 34 U/L (ref 0–37)
Albumin: 3.7 g/dL (ref 3.5–5.2)
Anion gap: 13 (ref 5–15)
BILIRUBIN TOTAL: 5.6 mg/dL — AB (ref 0.3–1.2)
BUN: 9 mg/dL (ref 6–23)
CHLORIDE: 104 meq/L (ref 96–112)
CO2: 22 meq/L (ref 19–32)
Calcium: 8.7 mg/dL (ref 8.4–10.5)
Creatinine, Ser: 0.63 mg/dL (ref 0.50–1.35)
GFR calc Af Amer: >90 mL/min (ref >90)
GFR calc non Af Amer: >90 mL/min (ref >90)
Glucose, Bld: 101 mg/dL — ABNORMAL HIGH (ref 70–99)
POTASSIUM: 3.4 meq/L — AB (ref 3.7–5.3)
Sodium: 139 mEq/L (ref 137–147)
Total Protein: 7 g/dL (ref 6.0–8.3)

## 2013-11-25 LAB — I-STAT CHEM 8, ED
BUN: 7 mg/dL (ref 6–23)
Calcium, Ion: 1.27 mmol/L — ABNORMAL HIGH (ref 1.12–1.23)
Chloride: 105 mEq/L (ref 96–112)
Creatinine, Ser: 1.3 mg/dL (ref 0.50–1.35)
Glucose, Bld: 103 mg/dL — ABNORMAL HIGH (ref 70–99)
HCT: 22 % — ABNORMAL LOW (ref 39.0–52.0)
HEMOGLOBIN: 7.5 g/dL — AB (ref 13.0–17.0)
POTASSIUM: 3.3 meq/L — AB (ref 3.7–5.3)
SODIUM: 142 meq/L (ref 137–147)
TCO2: 23 mmol/L (ref 0–100)

## 2013-11-25 LAB — CBC WITH DIFFERENTIAL/PLATELET
BASOS PCT: 1 % (ref 0–1)
Basophils Absolute: 0.2 10*3/uL — ABNORMAL HIGH (ref 0.0–0.1)
EOS PCT: 2 % (ref 0–5)
Eosinophils Absolute: 0.3 10*3/uL (ref 0.0–0.7)
HCT: 18.8 % — ABNORMAL LOW (ref 39.0–52.0)
Hemoglobin: 6.4 g/dL — CL (ref 13.0–17.0)
LYMPHS ABS: 3.1 10*3/uL (ref 0.7–4.0)
Lymphocytes Relative: 19 % (ref 12–46)
MCH: 33.2 pg (ref 26.0–34.0)
MCHC: 34 g/dL (ref 30.0–36.0)
MCV: 97.4 fL (ref 78.0–100.0)
MONOS PCT: 13 % — AB (ref 3–12)
Monocytes Absolute: 2.1 10*3/uL — ABNORMAL HIGH (ref 0.1–1.0)
Neutro Abs: 10.5 10*3/uL — ABNORMAL HIGH (ref 1.7–7.7)
Neutrophils Relative %: 65 % (ref 43–77)
PLATELETS: 458 10*3/uL — AB (ref 150–400)
RBC: 1.93 MIL/uL — AB (ref 4.22–5.81)
RDW: 21.4 % — ABNORMAL HIGH (ref 11.5–15.5)
WBC: 16.2 10*3/uL — AB (ref 4.0–10.5)

## 2013-11-25 LAB — URINALYSIS, ROUTINE W REFLEX MICROSCOPIC
Bilirubin Urine: NEGATIVE
Glucose, UA: NEGATIVE mg/dL
Hgb urine dipstick: NEGATIVE
Ketones, ur: NEGATIVE mg/dL
Leukocytes, UA: NEGATIVE
NITRITE: NEGATIVE
PH: 7 (ref 5.0–8.0)
Protein, ur: NEGATIVE mg/dL
SPECIFIC GRAVITY, URINE: 1.012 (ref 1.005–1.030)
Urobilinogen, UA: 2 mg/dL — ABNORMAL HIGH (ref 0.0–1.0)

## 2013-11-25 LAB — RETICULOCYTES
RBC.: 1.93 MIL/uL — ABNORMAL LOW (ref 4.22–5.81)
RETIC COUNT ABSOLUTE: 409.2 10*3/uL — AB (ref 19.0–186.0)
Retic Ct Pct: 21.2 % — ABNORMAL HIGH (ref 0.4–3.1)

## 2013-11-25 LAB — TROPONIN I: Troponin I: 0.49 ng/mL (ref <0.30)

## 2013-11-25 MED ORDER — ONDANSETRON HCL 4 MG/2ML IJ SOLN: 4.0000 mg | Freq: Three times a day (TID) | INTRAMUSCULAR | Status: DC | PRN

## 2013-11-25 MED ORDER — SODIUM CHLORIDE 0.9 % IV BOLUS (SEPSIS)
1000.0000 mL | Freq: Once | INTRAVENOUS | Status: AC
  Administered 2013-11-25: 1000 mL via INTRAVENOUS

## 2013-11-25 MED ORDER — SODIUM CHLORIDE 0.9 % IV SOLN: INTRAVENOUS | Status: DC

## 2013-11-25 MED ORDER — HYDROMORPHONE HCL 1 MG/ML IJ SOLN
1.0000 mg | INTRAMUSCULAR | Status: DC | PRN
  Administered 2013-11-25: 1 mg via INTRAVENOUS
  Filled 2013-11-25 (×3): qty 1

## 2013-11-25 MED ORDER — KETOROLAC TROMETHAMINE 30 MG/ML IJ SOLN
30.0000 mg | Freq: Once | INTRAMUSCULAR | Status: AC
  Administered 2013-11-25: 30 mg via INTRAVENOUS
  Filled 2013-11-25: qty 1

## 2013-11-25 MED ORDER — IOHEXOL 350 MG/ML SOLN
100.0000 mL | Freq: Once | INTRAVENOUS | Status: AC | PRN
  Administered 2013-11-25: 100 mL via INTRAVENOUS

## 2013-11-25 MED ORDER — ONDANSETRON HCL 4 MG/2ML IJ SOLN
4.0000 mg | Freq: Once | INTRAMUSCULAR | Status: AC
  Administered 2013-11-25: 4 mg via INTRAVENOUS
  Filled 2013-11-25: qty 2

## 2013-11-25 MED ORDER — HYDROMORPHONE HCL 2 MG/ML IJ SOLN
2.0000 mg | Freq: Once | INTRAMUSCULAR | Status: AC
  Administered 2013-11-25: 2 mg via INTRAVENOUS
  Filled 2013-11-25: qty 1

## 2013-11-25 NOTE — ED Notes (Signed)
Pt aware that urine sample is needed. States he is unable to void at this time

## 2013-11-25 NOTE — ED Provider Notes (Addendum)
CSN: [id number]     Arrival date & time 11/25/13  2100 History   First MD Initiated Contact with Patient 11/25/13 2117     Chief Complaint  Patient presents with  . Sickle Cell Pain Crisis     (Consider location/radiation/quality/duration/timing/severity/associated sxs/prior Treatment) HPI Comments: Patient presents to the ER stating that he is experiencing a sickle cell crisis which began earlier today. Patient complains of pain in the chest and anterior rib area bilaterally. Patient reports progressively worsening cough over the past few days. He felt very hot last night but did not take his temperature. He does not feel short of breath, but was noted to be hypoxic upon arrival to the ER. Patient reports that the pain across his ribs is severe and constant. He also has pain in his lower back which is typical for his sickle cell crisis.  Patient is a 33 y.o. male presenting with sickle cell pain.  Sickle Cell Pain Crisis Associated symptoms: chest pain and cough     Past Medical History  Diagnosis Date  . Sickle cell anemia   . Blood transfusion   . 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 Hypokalemia   . Mood disorder   . History of pulmonary embolus (PE)   . Avascular necrosis   . Leukocytosis     Chronic  . Thrombocytosis     Chronic  . Hypertension   . History of Clostridium difficile infection   . Uses marijuana   . Chronic anticoagulation   . Functional asplenia   . Former smoker   . Second hand tobacco smoke exposure   . Alcohol consumption of one to four drinks per day    Past Surgical History  Procedure Laterality Date  . Right hip replacement      08/2006  . Cholecystectomy      01/2008  . Porta cath placement    . Porta cath removal    . Umbilical hernia repair      01/2008  . Excision of left periauricular cyst      10/2009  . Excision of right ear lobe cyst with primary closur      11/2007  . Portacath placement  01/05/2012     Procedure: INSERTION PORT-A-CATH;  Surgeon: Odis Hollingshead, MD;  Location: [place];  Service: General;  Laterality: N/A;  ultrasound guiced port a cath insertion with fluoroscopy   Family History  Problem Relation Age of Onset  . Sickle cell anemia Mother   . Sickle cell anemia Father   . Sickle cell trait Brother    History  Substance Use Topics  . Smoking status: Former Smoker -- 13 years    Quit date: 07/08/2010  . Smokeless tobacco: Never Used  . Alcohol Use: No    Review of Systems  Respiratory: Positive for cough.   Cardiovascular: Positive for chest pain.  Musculoskeletal: Positive for back pain.  All other systems reviewed and are negative.     Allergies  Morphine and related  Home Medications   Prior to Admission medications   Medication Sig Start Date End Date Taking? Authorizing Provider  folic acid (FOLVITE) 1 MG tablet Take 1 tablet (1 mg total) by mouth every morning. 10/08/13  Yes [name], MD  HYDROmorphone (DILAUDID) 4 MG tablet Take 1 tablet (4 mg total) by mouth every 4 (four) hours as needed for severe pain. 10/24/13  Yes Dorena Dew, FNP  hydroxyurea (HYDREA) 500 MG  capsule Take 3 capsules (1,500 mg total) by mouth daily. May take with food to minimize GI side effects. 10/30/13  Yes [name], MD  lisinopril (PRINIVIL,ZESTRIL) 10 MG tablet Take 1 tablet (10 mg total) by mouth daily. 11/19/13  Yes [name], MD  metoprolol tartrate (LOPRESSOR) 25 MG tablet Take 1 tablet (25 mg total) by mouth daily. 08/22/13  Yes [name], MD  morphine (MS CONTIN) 30 MG 12 hr tablet Take 1 tablet (30 mg total) by mouth every 12 (twelve) hours. 11/07/13  Yes Dorena Dew, FNP  potassium chloride SA (K-DUR,KLOR-CON) 20 MEQ tablet Take 1 tablet (20 mEq total) by mouth every morning. 09/25/13  Yes [name], MD  rivaroxaban (XARELTO) 20 MG TABS tablet Take 1 tablet (20 mg total) by mouth every morning. 10/24/13  Yes Dorena Dew, FNP  zolpidem (AMBIEN) 10 MG tablet Take 1 tablet (10 mg total) by mouth at bedtime as needed for sleep. 10/28/13  Yes Dorena Dew, FNP   BP 105/65  Pulse 66  Temp(Src) 98.3 F (36.8 C) (Oral)  Resp 18  SpO2 88% Physical Exam  Constitutional: He is oriented to person, place, and time. He appears well-developed and well-nourished. No distress.  HENT:  Head: Normocephalic and atraumatic.  Right Ear: Hearing normal.  Left Ear: Hearing normal.  Nose: Nose normal.  Mouth/Throat: Oropharynx is clear and moist and mucous membranes are normal.  Eyes: Conjunctivae and EOM are normal. Pupils are equal, round, and reactive to light.  Neck: Normal range of motion. Neck supple.  Cardiovascular: Regular rhythm, S1 normal and S2 normal.  Exam reveals no gallop and no friction rub.   No murmur heard. Pulmonary/Chest: Effort normal and breath sounds normal. No respiratory distress. He exhibits no tenderness.  Abdominal: Soft. Normal appearance and bowel sounds are normal. There is no hepatosplenomegaly. There is no tenderness. There is no rebound, no guarding, no tenderness at McBurney's point and negative Murphy's sign. No hernia.  Musculoskeletal: Normal range of motion.  Neurological: He is alert and oriented to person, place, and time. He has normal strength. No cranial nerve deficit or sensory deficit. Coordination normal. GCS eye subscore is 4. GCS verbal subscore is 5. GCS motor subscore is 6.  Skin: Skin is warm, dry and intact. No rash noted. No cyanosis.  Psychiatric: He has a normal mood and affect. His speech is normal and behavior is normal. Thought content normal.    ED Course  Procedures (including critical care time) Labs Review Labs Reviewed  CBC WITH DIFFERENTIAL - Abnormal; Notable for the following:    WBC 16.2 (*)    RBC 1.93 (*)    Hemoglobin 6.4 (*)    HCT 18.8 (*)    RDW 21.4 (*)    Platelets 458 (*)    All other components within normal limits   COMPREHENSIVE METABOLIC PANEL - Abnormal; Notable for the following:    Potassium 3.4 (*)    Glucose, Bld 101 (*)    Total Bilirubin 5.6 (*)    All other components within normal limits  TROPONIN I - Abnormal; Notable for the following:    Troponin I 0.49 (*)    All other components within normal limits  RETICULOCYTES - Abnormal; Notable for the following:    Retic Ct Pct 21.2 (*)    RBC. 1.93 (*)    Retic Count, Manual 409.2 (*)    All other components within normal limits  I-STAT CHEM 8,  ED - Abnormal; Notable for the following:    Potassium 3.3 (*)    Glucose, Bld 103 (*)    Calcium, Ion 1.27 (*)    Hemoglobin 7.5 (*)    HCT 22.0 (*)    All other components within normal limits  URINALYSIS, ROUTINE W REFLEX MICROSCOPIC    Imaging Review Ct Angio Chest Pe W/cm &/or Wo Cm  11/25/2013   CLINICAL DATA:  Sickle cell trait. Lower back and bilateral leg pain. Mild shortness of breath. Initial encounter  EXAM: CT ANGIOGRAPHY CHEST WITH CONTRAST  TECHNIQUE: Multidetector CT imaging of the chest was performed using the standard protocol during bolus administration of intravenous contrast. Multiplanar CT image reconstructions and MIPs were obtained to evaluate the vascular anatomy.  CONTRAST:  112mL OMNIPAQUE IOHEXOL 350 MG/ML SOLN  COMPARISON:  08/26/2013  FINDINGS: THORACIC INLET/BODY WALL:  Left subclavian porta catheter in unremarkable position. No acute findings.  MEDIASTINUM:  Chronic cardiomegaly. No pericardial effusion. There is right heart enlargement and main pulmonary artery enlargement (42 mm) suggesting pulmonary hypertension. As permitted by bolus dispersion and intermittent respiratory motion, no pulmonary embolism. No aortic dissection or aneurysm. Chronic mediastinal lymphadenopathy, especially superior and anterior mediastinal. Thymus remains visible and stable from previous imaging.  LUNG WINDOWS:  There is chronic interstitial opacities in the lower lungs with volume loss,  likely a combination of scarring and atelectasis. Patchy reticular nodular opacities are also chronic. No definitive pneumonia or edema. No effusion or pneumothorax.  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  OSSEOUS:  Typical osseous changes of sickle cell disease with thoracic endplate concavities and bilateral humeral head osteonecrosis.  Review of the MIP images confirms the above findings.  IMPRESSION: 1. Negative for pulmonary embolism. 2. Chronic lung disease without definite pneumonia or edema. 3. Chronic findings are stable from priors and noted above.   Electronically Signed   By: Jorje Guild M.D.   On: 11/25/2013 22:53     EKG Interpretation   Date/Time:  Monday November 25 2013 21:04:51 EDT Ventricular Rate:  74 PR Interval:  199 QRS Duration: 111 QT Interval:  415 QTC Calculation: 460 R Axis:   -56 Text Interpretation:  Sinus rhythm T wave abnormality, consider anterior  ischemia Baseline wander since last tracing no significant change  Confirmed by Wilson Singer  MD, STEPHEN ([id number]) on 11/25/2013 9:08:45 PM      MDM   Final diagnoses:  Chest pain  Sickle cell anemia with crisis  Acute chest syndrome   Patient presents to the ER for evaluation of bilateral chest discomfort. Patient has a history of sickle cell disease. Patient reports previous presentation and treatment for acute chest syndrome but similar symptoms. Patient has chronic anemia in the range of his normal baseline. Leukocytosis secondary to acute sickle cell crisis. No evidence of infection. Compensatory metabolic panel normal except for elevated bilirubin secondary to his sickle cell anemia. Patient did, however, have an elevated troponin. EKG was unchanged from previous. Reviewing his records, when he was admitted in May for acute chest syndrome, patient had elevated troponins without as well. Cardiology felt that this was secondary  to his pulmonary hypertension, not consistent with an STEMI or acute coronary syndrome.  Patient does have a previous history of PE. He is currently taking Xarelto, making recurrent PE less likely. It was, however concerned in the differential diagnosis as well as pulmonary infarct. CT angiography was performed and did not show any acute  findings.  Patient will be admitted to the hospitalist service for further management of acute sickle cell crisis with acute chest syndrome.  Addendum: Discussed with Dr. Aundra Dubin, on-call for cardiology. He did not recommend any changes in the current management of the patient. No heparinization, continue his current medications including Xarelto. He recommended follow-up with cardiology as an outpatient for further evaluation.  Orpah Greek, MD 11/25/13 [place], MD 11/26/13 [phone]

## 2013-11-25 NOTE — ED Notes (Signed)
Bed: WA16 Expected date:  Expected time:  Means of arrival:  Comments: ems 

## 2013-11-25 NOTE — ED Notes (Signed)
Pt reported awaken this morning  In bil ribs. Pain now in lower back and bil legs. Denies N/V/D and abd pain.

## 2013-11-26 ENCOUNTER — Encounter (HOSPITAL_COMMUNITY): Payer: Self-pay | Admitting: Internal Medicine

## 2013-11-26 DIAGNOSIS — I270 Primary pulmonary hypertension: Secondary | ICD-10-CM

## 2013-11-26 DIAGNOSIS — D638 Anemia in other chronic diseases classified elsewhere: Secondary | ICD-10-CM

## 2013-11-26 DIAGNOSIS — D5700 Hb-SS disease with crisis, unspecified: Secondary | ICD-10-CM | POA: Diagnosis present

## 2013-11-26 DIAGNOSIS — J209 Acute bronchitis, unspecified: Secondary | ICD-10-CM

## 2013-11-26 DIAGNOSIS — R778 Other specified abnormalities of plasma proteins: Secondary | ICD-10-CM | POA: Diagnosis present

## 2013-11-26 DIAGNOSIS — R7989 Other specified abnormal findings of blood chemistry: Secondary | ICD-10-CM | POA: Diagnosis present

## 2013-11-26 LAB — COMPREHENSIVE METABOLIC PANEL
ALK PHOS: 81 U/L (ref 39–117)
ALT: 18 U/L (ref 0–53)
ANION GAP: 11 (ref 5–15)
AST: 34 U/L (ref 0–37)
Albumin: 3.7 g/dL (ref 3.5–5.2)
BUN: 8 mg/dL (ref 6–23)
CO2: 23 meq/L (ref 19–32)
Calcium: 8.6 mg/dL (ref 8.4–10.5)
Chloride: 106 mEq/L (ref 96–112)
Creatinine, Ser: 0.65 mg/dL (ref 0.50–1.35)
GLUCOSE: 130 mg/dL — AB (ref 70–99)
POTASSIUM: 3.5 meq/L — AB (ref 3.7–5.3)
SODIUM: 140 meq/L (ref 137–147)
TOTAL PROTEIN: 7.2 g/dL (ref 6.0–8.3)
Total Bilirubin: 5.5 mg/dL — ABNORMAL HIGH (ref 0.3–1.2)

## 2013-11-26 LAB — CBC WITH DIFFERENTIAL/PLATELET
BASOS ABS: 0.2 10*3/uL — AB (ref 0.0–0.1)
BASOS PCT: 1 % (ref 0–1)
Eosinophils Absolute: 0.5 10*3/uL (ref 0.0–0.7)
Eosinophils Relative: 3 % (ref 0–5)
HCT: 18.8 % — ABNORMAL LOW (ref 39.0–52.0)
HEMOGLOBIN: 6.5 g/dL — AB (ref 13.0–17.0)
LYMPHS PCT: 20 % (ref 12–46)
Lymphs Abs: 3.5 10*3/uL (ref 0.7–4.0)
MCH: 34.4 pg — AB (ref 26.0–34.0)
MCHC: 34.6 g/dL (ref 30.0–36.0)
MCV: 99.5 fL (ref 78.0–100.0)
MONO ABS: 2.1 10*3/uL — AB (ref 0.1–1.0)
Monocytes Relative: 12 % (ref 3–12)
Neutro Abs: 11.4 10*3/uL — ABNORMAL HIGH (ref 1.7–7.7)
Neutrophils Relative %: 64 % (ref 43–77)
PLATELETS: 422 10*3/uL — AB (ref 150–400)
RBC: 1.89 MIL/uL — ABNORMAL LOW (ref 4.22–5.81)
RDW: 22 % — AB (ref 11.5–15.5)
WBC: 17.7 10*3/uL — ABNORMAL HIGH (ref 4.0–10.5)

## 2013-11-26 LAB — RAPID URINE DRUG SCREEN, HOSP PERFORMED
Amphetamines: NOT DETECTED
Barbiturates: NOT DETECTED
Benzodiazepines: NOT DETECTED
COCAINE: NOT DETECTED
OPIATES: POSITIVE — AB
TETRAHYDROCANNABINOL: POSITIVE — AB

## 2013-11-26 LAB — TROPONIN I
TROPONIN I: 0.44 ng/mL — AB (ref <0.30)
Troponin I: 0.38 ng/mL (ref <0.30)
Troponin I: 0.32 ng/mL (ref <0.30)

## 2013-11-26 LAB — LACTATE DEHYDROGENASE: LDH: 503 U/L — ABNORMAL HIGH (ref 94–250)

## 2013-11-26 MED ORDER — CETYLPYRIDINIUM CHLORIDE 0.05 % MT LIQD
7.0000 mL | Freq: Two times a day (BID) | OROMUCOSAL | Status: DC
  Administered 2013-11-26 – 2013-11-28 (×5): 7 mL via OROMUCOSAL

## 2013-11-26 MED ORDER — LISINOPRIL 10 MG PO TABS
10.0000 mg | ORAL_TABLET | Freq: Every day | ORAL | Status: DC
  Administered 2013-11-26 – 2013-11-28 (×3): 10 mg via ORAL
  Filled 2013-11-26 (×3): qty 1

## 2013-11-26 MED ORDER — SODIUM CHLORIDE 0.9 % IJ SOLN: 9.0000 mL | INTRAMUSCULAR | Status: DC | PRN

## 2013-11-26 MED ORDER — FOLIC ACID 1 MG PO TABS
1.0000 mg | ORAL_TABLET | Freq: Every morning | ORAL | Status: DC
  Administered 2013-11-26 – 2013-11-28 (×3): 1 mg via ORAL
  Filled 2013-11-26 (×3): qty 1

## 2013-11-26 MED ORDER — NALOXONE HCL 0.4 MG/ML IJ SOLN: 0.4000 mg | INTRAMUSCULAR | Status: DC | PRN

## 2013-11-26 MED ORDER — POTASSIUM CHLORIDE CRYS ER 20 MEQ PO TBCR
20.0000 meq | EXTENDED_RELEASE_TABLET | Freq: Every morning | ORAL | Status: DC
  Administered 2013-11-26 – 2013-11-28 (×3): 20 meq via ORAL
  Filled 2013-11-26 (×3): qty 1

## 2013-11-26 MED ORDER — LEVOFLOXACIN IN D5W 750 MG/150ML IV SOLN
750.0000 mg | INTRAVENOUS | Status: DC
  Administered 2013-11-26 – 2013-11-27 (×2): 750 mg via INTRAVENOUS
  Filled 2013-11-26 (×2): qty 150

## 2013-11-26 MED ORDER — METOPROLOL TARTRATE 25 MG PO TABS
25.0000 mg | ORAL_TABLET | Freq: Every day | ORAL | Status: DC
  Administered 2013-11-26 – 2013-11-28 (×3): 25 mg via ORAL
  Filled 2013-11-26 (×3): qty 1

## 2013-11-26 MED ORDER — FOLIC ACID 1 MG PO TABS: 1.0000 mg | ORAL_TABLET | Freq: Every day | ORAL | Status: DC

## 2013-11-26 MED ORDER — DIPHENHYDRAMINE HCL 12.5 MG/5ML PO ELIX
12.5000 mg | ORAL_SOLUTION | Freq: Four times a day (QID) | ORAL | Status: DC | PRN
  Administered 2013-11-26 (×2): 12.5 mg via ORAL
  Filled 2013-11-26 (×2): qty 5

## 2013-11-26 MED ORDER — HYDROMORPHONE HCL 2 MG/ML IJ SOLN
2.0000 mg | Freq: Once | INTRAMUSCULAR | Status: AC
  Administered 2013-11-26: 2 mg via INTRAVENOUS
  Filled 2013-11-26: qty 1

## 2013-11-26 MED ORDER — POTASSIUM CHLORIDE CRYS ER 20 MEQ PO TBCR
40.0000 meq | EXTENDED_RELEASE_TABLET | ORAL | Status: AC
  Administered 2013-11-26 (×2): 40 meq via ORAL
  Filled 2013-11-26 (×2): qty 2

## 2013-11-26 MED ORDER — HYDROMORPHONE HCL 1 MG/ML IJ SOLN
1.0000 mg | Freq: Once | INTRAMUSCULAR | Status: AC
  Administered 2013-11-26: 1 mg via INTRAVENOUS

## 2013-11-26 MED ORDER — HYDROMORPHONE 2 MG/ML HIGH CONCENTRATION IV PCA SOLN
INTRAVENOUS | Status: DC
  Administered 2013-11-26: 13.3 mg via INTRAVENOUS
  Administered 2013-11-26: 8.4 mg via INTRAVENOUS
  Administered 2013-11-26: 5.6 mg via INTRAVENOUS
  Administered 2013-11-26: 4.2 mg via INTRAVENOUS
  Administered 2013-11-26: 02:00:00 via INTRAVENOUS
  Administered 2013-11-26: 6.3 mg via INTRAVENOUS
  Administered 2013-11-27: 9.8 mg via INTRAVENOUS
  Administered 2013-11-27: via INTRAVENOUS
  Administered 2013-11-27: 7.7 mg via INTRAVENOUS
  Administered 2013-11-27: via INTRAVENOUS
  Administered 2013-11-27: 2.8 mg via INTRAVENOUS
  Administered 2013-11-27: 9.1 mg via INTRAVENOUS
  Administered 2013-11-27: 9.8 mg via INTRAVENOUS
  Administered 2013-11-27: 7 mg via INTRAVENOUS
  Administered 2013-11-28: 9.8 mg via INTRAVENOUS
  Administered 2013-11-28: 6.3 mg via INTRAVENOUS
  Administered 2013-11-28: 10.5 mg via INTRAVENOUS
  Administered 2013-11-28: 7.7 mg via INTRAVENOUS
  Filled 2013-11-26 (×3): qty 25

## 2013-11-26 MED ORDER — ONDANSETRON HCL 4 MG/2ML IJ SOLN: 4.0000 mg | Freq: Four times a day (QID) | INTRAMUSCULAR | Status: DC | PRN

## 2013-11-26 MED ORDER — MORPHINE SULFATE ER 30 MG PO TBCR
30.0000 mg | EXTENDED_RELEASE_TABLET | Freq: Two times a day (BID) | ORAL | Status: DC
  Administered 2013-11-26 – 2013-11-28 (×6): 30 mg via ORAL
  Filled 2013-11-26 (×6): qty 1

## 2013-11-26 MED ORDER — RIVAROXABAN 20 MG PO TABS
20.0000 mg | ORAL_TABLET | Freq: Every day | ORAL | Status: DC
  Administered 2013-11-26 – 2013-11-28 (×3): 20 mg via ORAL
  Filled 2013-11-26 (×4): qty 1

## 2013-11-26 MED ORDER — HYDROXYUREA 500 MG PO CAPS
1500.0000 mg | ORAL_CAPSULE | Freq: Every day | ORAL | Status: DC
  Administered 2013-11-26 – 2013-11-28 (×3): 1500 mg via ORAL
  Filled 2013-11-26 (×3): qty 3

## 2013-11-26 MED ORDER — ZOLPIDEM TARTRATE 10 MG PO TABS
10.0000 mg | ORAL_TABLET | Freq: Every evening | ORAL | Status: DC | PRN
Start: 2013-11-26 — End: 2013-11-28
  Administered 2013-11-26 – 2013-11-27 (×2): 10 mg via ORAL
  Filled 2013-11-26 (×2): qty 1

## 2013-11-26 MED ORDER — SODIUM CHLORIDE 0.45 % IV SOLN
INTRAVENOUS | Status: DC
  Administered 2013-11-26 – 2013-11-27 (×4): via INTRAVENOUS

## 2013-11-26 MED ORDER — SODIUM CHLORIDE 0.9 % IV SOLN
12.5000 mg | Freq: Four times a day (QID) | INTRAVENOUS | Status: DC | PRN
  Filled 2013-11-26: qty 0.25

## 2013-11-26 NOTE — H&P (Signed)
[facility] History and Physical  JENSEN KILBURG ONG:[id number] DOB: 05/23/1980 DOA: 11/25/2013  Referring physician: ER physician. PCP: [name],[name], MD   Chief Complaint: Chest pain and low back pain.  HPI: [name] is a 33 y.o. male with history of sickle cell disease, pulmonary hypertension and pulmonary embolism on Xarelto, hypertension presents to the ER because of bilateral chest pain and low back pain. Patient states that he has been having pain off and on for last 1 week but had worsened yesterday morning. Patient denies any associated fever chills or shortness of breath but has been having some productive cough. In the ER patient was found to have positive troponin with EKG showing atrial lead T-wave inversion. CT angiography of the chest was done which was unremarkable and patient has been admitted for sickle cell pain crisis. Patient otherwise denies any headache visual symptoms or any focal deficits. Patient has had previous episodes of elevated troponin in last May and at that time patient was planned to have outpatient stress test but patient had not followed up.  Review of Systems: As presented in the history of presenting illness, rest negative.  Past Medical History  Diagnosis Date  . Sickle cell anemia   . Blood transfusion   . 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 Hypokalemia   . Mood disorder   . History of pulmonary embolus (PE)   . Avascular necrosis   . Leukocytosis     Chronic  . Thrombocytosis     Chronic  . Hypertension   . History of Clostridium difficile infection   . Uses marijuana   . Chronic anticoagulation   . Functional asplenia   . Former smoker   . Second hand tobacco smoke exposure   . Alcohol consumption of one to four drinks per day    Past Surgical History  Procedure Laterality Date  . Right hip replacement      08/2006  . Cholecystectomy      01/2008  . Porta cath placement    .  Porta cath removal    . Umbilical hernia repair      01/2008  . Excision of left periauricular cyst      10/2009  . Excision of right ear lobe cyst with primary closur      11/2007  . Portacath placement  01/05/2012    Procedure: INSERTION PORT-A-CATH;  Surgeon: Odis Hollingshead, MD;  Location: [place];  Service: General;  Laterality: N/A;  ultrasound guiced port a cath insertion with fluoroscopy   Social History:  reports that he quit smoking about 3 years ago. He has never used smokeless tobacco. He reports that he uses illicit drugs (Marijuana) about twice per week. He reports that he does not drink alcohol. Where does patient live home. Can patient participate in ADLs? Yes.  Allergies  Allergen Reactions  . Morphine And Related Hives and Rash    Pt states he can take PO, but not IV and he is able to tolerate Dilaudid with no reactions.    Family History:  Family History  Problem Relation Age of Onset  . Sickle cell anemia Mother   . Sickle cell anemia Father   . Sickle cell trait Brother       Prior to Admission medications   Medication Sig Start Date End Date Taking? Authorizing Provider  folic acid (FOLVITE) 1 MG tablet Take 1 tablet (1 mg total) by mouth every morning. 10/08/13  Yes [name], MD  HYDROmorphone (DILAUDID) 4 MG tablet Take 1 tablet (4 mg total) by mouth every 4 (four) hours as needed for severe pain. 10/24/13  Yes Dorena Dew, FNP  hydroxyurea (HYDREA) 500 MG capsule Take 3 capsules (1,500 mg total) by mouth daily. May take with food to minimize GI side effects. 10/30/13  Yes [name], MD  lisinopril (PRINIVIL,ZESTRIL) 10 MG tablet Take 1 tablet (10 mg total) by mouth daily. 11/19/13  Yes [name], MD  metoprolol tartrate (LOPRESSOR) 25 MG tablet Take 1 tablet (25 mg total) by mouth daily. 08/22/13  Yes [name], MD  morphine (MS CONTIN) 30 MG 12 hr tablet Take 1 tablet (30 mg total) by mouth every 12 (twelve) hours.  11/07/13  Yes Dorena Dew, FNP  potassium chloride SA (K-DUR,KLOR-CON) 20 MEQ tablet Take 1 tablet (20 mEq total) by mouth every morning. 09/25/13  Yes [name], MD  rivaroxaban (XARELTO) 20 MG TABS tablet Take 1 tablet (20 mg total) by mouth every morning. 10/24/13  Yes Dorena Dew, FNP  zolpidem (AMBIEN) 10 MG tablet Take 1 tablet (10 mg total) by mouth at bedtime as needed for sleep. 10/28/13  Yes Dorena Dew, FNP    Physical Exam: Filed Vitals:   11/25/13 2150 11/25/13 2330 11/26/13 0000 11/26/13 0043  BP: 105/65 113/62 107/64 125/69  Pulse: 66 70 57 74  Temp:    98.6 F (37 C)  TempSrc:    Oral  Resp: 18 15 18 20   Height:    6' (1.829 m)  Weight:    70.806 kg (156 lb 1.6 oz)  SpO2: 88% 93% 98% 100%     General:  Well-developed and nourished.  Eyes: Anicteric mild pallor.  ENT: No discharge from ears eyes nose and mouth.  Neck: No mass felt.  Cardiovascular: S1-S2 heard.  Respiratory: No rhonchi or crepitations.  Abdomen: Soft nontender bowel sounds present. No guarding or rigidity.  Skin: No rash.  Musculoskeletal: No edema.  Psychiatric: Appears normal.  Neurologic: Alert awake oriented to time place and person. Moves all extremities.  Labs on Admission:  Basic Metabolic Panel:  Recent Labs Lab 11/19/13 1024 11/25/13 2153 11/25/13 2212  NA 137 139 142  K 4.0 3.4* 3.3*  CL 102 104 105  CO2 23 22  --   GLUCOSE 101* 101* 103*  BUN 8 9 7   CREATININE 0.56 0.63 1.30  CALCIUM 9.0 8.7  --    Liver Function Tests:  Recent Labs Lab 11/25/13 2153  AST 34  ALT 18  ALKPHOS 81  BILITOT 5.6*  PROT 7.0  ALBUMIN 3.7   No results found for this basename: LIPASE, AMYLASE,  in the last 168 hours No results found for this basename: AMMONIA,  in the last 168 hours CBC:  Recent Labs Lab 11/19/13 1024 11/25/13 2153 11/25/13 2212  WBC 15.3* 16.2*  --   NEUTROABS 11.7* 10.5*  --   HGB 6.9* 6.4* 7.5*  HCT 20.4* 18.8* 22.0*  MCV  96.7 97.4  --   PLT 589* 458*  --    Cardiac Enzymes:  Recent Labs Lab 11/25/13 2153  TROPONINI 0.49*    BNP (last 3 results) No results found for this basename: PROBNP,  in the last 8760 hours CBG: No results found for this basename: GLUCAP,  in the last 168 hours  Radiological Exams on Admission: Ct Angio Chest Pe W/cm &/or Wo Cm  11/25/2013  CLINICAL DATA:  Sickle cell trait. Lower back and bilateral leg pain. Mild shortness of breath. Initial encounter  EXAM: CT ANGIOGRAPHY CHEST WITH CONTRAST  TECHNIQUE: Multidetector CT imaging of the chest was performed using the standard protocol during bolus administration of intravenous contrast. Multiplanar CT image reconstructions and MIPs were obtained to evaluate the vascular anatomy.  CONTRAST:  138mL OMNIPAQUE IOHEXOL 350 MG/ML SOLN  COMPARISON:  08/26/2013  FINDINGS: THORACIC INLET/BODY WALL:  Left subclavian porta catheter in unremarkable position. No acute findings.  MEDIASTINUM:  Chronic cardiomegaly. No pericardial effusion. There is right heart enlargement and main pulmonary artery enlargement (42 mm) suggesting pulmonary hypertension. As permitted by bolus dispersion and intermittent respiratory motion, no pulmonary embolism. No aortic dissection or aneurysm. Chronic mediastinal lymphadenopathy, especially superior and anterior mediastinal. Thymus remains visible and stable from previous imaging.  LUNG WINDOWS:  There is chronic interstitial opacities in the lower lungs with volume loss, likely a combination of scarring and atelectasis. Patchy reticular nodular opacities are also chronic. No definitive pneumonia or edema. No effusion or pneumothorax.  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  OSSEOUS:  Typical osseous changes of sickle cell disease with thoracic endplate concavities and bilateral humeral head osteonecrosis.  Review of the MIP images  confirms the above findings.  IMPRESSION: 1. Negative for pulmonary embolism. 2. Chronic lung disease without definite pneumonia or edema. 3. Chronic findings are stable from priors and noted above.   Electronically Signed   By: Jorje Guild M.D.   On: 11/25/2013 22:53    EKG: Independently reviewed. Normal sinus rhythm with anterior T-wave inversions. Comparable to old EKG.  Assessment/Plan Principal Problem:   Sickle cell anemia with crisis Active Problems:   Hx of pulmonary embolus   Acute chest syndrome   Pulmonary HTN   HTN (hypertension)   Elevated troponin   Sickle cell pain crisis   1. Sickle cell pain crisis with possible acute chest syndrome  - Patient was found to be hypoxic in the ER with patient also complaining of productive cough for which patient has been placed on antibiotics for possible chest syndrome. Patient has been placed on custom dose Dilaudid PCA pump and continuing with patient's home medications. Hemoglobin appears to be at baseline and closely follow CBC. 2. Elevated troponin  - on-call cardiologist Dr. Marigene Ehlers was consulted by ER physician and at this time they have requested to cycle cardiac markers and patient will need outpatient follow-up for right heart cath. Cardiologist advised to continue present home medications including xarelto. 3. History of pulmonary embolism - continue xarelto. 4. History of hypertension - continue present medications. 5. History of pulmonary hypertension - see #2.    Code Status:  Full code. Familyily Communication:None.  Disposition Plan: Admit to inpatient.    KAKRAKANDY,ARSHAD N. [facility] Pager [phone].  If 7PM-7AM, please contact night-coverage [email or web address] Password [email or web address] 11/26/2013, 1:24 AM

## 2013-11-26 NOTE — Progress Notes (Signed)
SICKLE CELL SERVICE PROGRESS NOTE  [name] IOX:[id number] DOB: 01-Mar-1980 DOA: 11/25/2013 PCP: [name],[name], MD  Assessment/Plan: Principal Problem:   Sickle cell anemia with crisis Active Problems:   Acute chest syndrome   Hx of pulmonary embolus   Pulmonary HTN   HTN (hypertension)   Elevated troponin   Sickle cell pain crisis  1. Hb SS with crisis:  He has not maximized his PCA using only 22.4 mg in 14 hours with 30/30: demands/deliveries. Add Toradol and continue IVF. 2. Acute Bronchitis: Pt was started on Levaquin by admitting Physician. He is a smoker and is having productive sputum. I will continue to treat empirically with Levaquin. 3. Anemia: Pt's Hb is at it;s baseline. Pt has been evaluated for a concurrent Iron deficiency anemia however he has high serum iron levels. Will check transferrin saturation as an out patient , He has missed his last appointments in the clinic and still has not rescheduled despite several calls. Will continue to monitor Hb. 4. Elevated Troponin: I suspect that he had some demand ischemia. ECHO ftrom 05/2013 reviewed and shows normal LVF without regional wall motion abnormalities.  He has similar episodes while hospitalized in the past. He has been referred to cardiology for further evaluation and has missed  2 appointments.  Code Status: Full Code Family Communication: N/A Disposition Plan: Not yet ready for discharge.  [name],[name]  Pager [phone]. If 7PM-7AM, please contact night-coverage.  11/26/2013, 6:37 PM  LOS: 1 day   Initial History: [name] is a 33 y.o. male with history of sickle cell disease, pulmonary hypertension and pulmonary embolism on Xarelto, hypertension presents to the ER because of bilateral chest pain and low back pain. Patient states that he has been having pain off and on for last 1 week but had worsened yesterday morning. Patient denies any associated fever chills or shortness of breath  but has been having some productive cough. In the ER patient was found to have positive troponin with EKG showing atrial lead T-wave inversion. CT angiography of the chest was done which was unremarkable and patient has been admitted for sickle cell pain crisis. Patient otherwise denies any headache visual symptoms or any focal deficits. Patient has had previous episodes of elevated troponin in last May and at that time patient was planned to have outpatient stress test but patient had not followed up   Consultants:  None  Procedures:  None  Antibiotics:  Levaquin 10/27 >>  HPI/Subjective: Pt states that he had a sudden onset of pain during the day on Monday and thought that his pain would be manageable with oral medications. His pain escalated during the evening and he presented tot he ED. He currently rates that pain as 7/10.  Objective: Filed Vitals:   11/26/13 1004 11/26/13 1130 11/26/13 1400 11/26/13 1545  BP: 118/65  115/73   Pulse: 67  77   Temp:   98.2 F (36.8 C)   TempSrc:   Oral   Resp: 19 27 18 23   Height:      Weight:      SpO2: 97% 98% 99% 97%   Weight change:   Intake/Output Summary (Last 24 hours) at 11/26/13 1837 Last data filed at 11/26/13 1802  Gross per 24 hour  Intake 2816.25 ml  Output   2600 ml  Net 216.25 ml    General: Alert, awake, oriented x3, in no acute distress.  HEENT: [place]/AT PEERL, EOMI, mild icterus Neck: Trachea midline,  no masses,  no thyromegal,y no JVD, no carotid bruit OROPHARYNX:  Moist, No exudate/ erythema/lesions.  Heart: Regular rate and rhythm, without murmurs, rubs, gallops, PMI non-displaced, no heaves or thrills on palpation.  Lungs: Clear to auscultation, no wheezing or rhonchi noted. No increased vocal fremitus resonant to percussion  Abdomen: Soft, nontender, nondistended, positive bowel sounds, no masses no hepatosplenomegaly noted.  Neuro: No focal neurological deficits noted cranial nerves II through XII grossly  intact. DTRs 2+ bilaterally upper and lower extremities. Strength normal in bilateral upper and lower extremities. Musculoskeletal: No warm swelling or erythema around joints. Psychiatric: Patient alert and oriented x3, good insight and cognition, good recent to remote recall. Lymph node survey: No cervical axillary or inguinal lymphadenopathy noted.   Data Reviewed: Basic Metabolic Panel:  Recent Labs Lab 11/25/13 2153 11/25/13 2212 11/26/13 0220  NA 139 142 140  K 3.4* 3.3* 3.5*  CL 104 105 106  CO2 22  --  23  GLUCOSE 101* 103* 130*  BUN 9 7 8   CREATININE 0.63 1.30 0.65  CALCIUM 8.7  --  8.6   Liver Function Tests:  Recent Labs Lab 11/25/13 2153 11/26/13 0220  AST 34 34  ALT 18 18  ALKPHOS 81 81  BILITOT 5.6* 5.5*  PROT 7.0 7.2  ALBUMIN 3.7 3.7   No results found for this basename: LIPASE, AMYLASE,  in the last 168 hours No results found for this basename: AMMONIA,  in the last 168 hours CBC:  Recent Labs Lab 11/25/13 2153 11/25/13 2212 11/26/13 0220  WBC 16.2*  --  17.7*  NEUTROABS 10.5*  --  11.4*  HGB 6.4* 7.5* 6.5*  HCT 18.8* 22.0* 18.8*  MCV 97.4  --  99.5  PLT 458*  --  422*   Cardiac Enzymes:  Recent Labs Lab 11/25/13 2153 11/26/13 0220 11/26/13 0625 11/26/13 1325  TROPONINI 0.49* 0.44* 0.38* 0.32*   BNP (last 3 results) No results found for this basename: PROBNP,  in the last 8760 hours CBG: No results found for this basename: GLUCAP,  in the last 168 hours  No results found for this or any previous visit (from the past 240 hour(s)).   Studies: Dg Chest 2 View  10/30/2013   CLINICAL DATA:  Difficulty breathing and chest pain; sickle cell disease  EXAM: CHEST  2 VIEW  COMPARISON:  October 27, 2013  FINDINGS: Port-A-Cath tip is at the cavoatrial junction. No pneumothorax. Areas of mild scarring are noted bilaterally. There is no edema or consolidation. Heart is borderline enlarged with pulmonary vascularity showing mild pulmonary  venous hypertension. No adenopathy. There are no appreciable bone lesions.  IMPRESSION: Areas of mild scarring bilaterally. Evidence of mild pulmonary venous congestion, a finding common with sickle cell disease. No frank edema or consolidation.   Electronically Signed   By: Lowella Grip M.D.   On: 10/30/2013 12:16   Ct Angio Chest Pe W/cm &/or Wo Cm  11/25/2013   CLINICAL DATA:  Sickle cell trait. Lower back and bilateral leg pain. Mild shortness of breath. Initial encounter  EXAM: CT ANGIOGRAPHY CHEST WITH CONTRAST  TECHNIQUE: Multidetector CT imaging of the chest was performed using the standard protocol during bolus administration of intravenous contrast. Multiplanar CT image reconstructions and MIPs were obtained to evaluate the vascular anatomy.  CONTRAST:  169mL OMNIPAQUE IOHEXOL 350 MG/ML SOLN  COMPARISON:  08/26/2013  FINDINGS: THORACIC INLET/BODY WALL:  Left subclavian porta catheter in unremarkable position. No acute findings.  MEDIASTINUM:  Chronic cardiomegaly. No pericardial effusion. There  is right heart enlargement and main pulmonary artery enlargement (42 mm) suggesting pulmonary hypertension. As permitted by bolus dispersion and intermittent respiratory motion, no pulmonary embolism. No aortic dissection or aneurysm. Chronic mediastinal lymphadenopathy, especially superior and anterior mediastinal. Thymus remains visible and stable from previous imaging.  LUNG WINDOWS:  There is chronic interstitial opacities in the lower lungs with volume loss, likely a combination of scarring and atelectasis. Patchy reticular nodular opacities are also chronic. No definitive pneumonia or edema. No effusion or pneumothorax.  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  OSSEOUS:  Typical osseous changes of sickle cell disease with thoracic endplate concavities and bilateral humeral head osteonecrosis.  Review of the MIP  images confirms the above findings.  IMPRESSION: 1. Negative for pulmonary embolism. 2. Chronic lung disease without definite pneumonia or edema. 3. Chronic findings are stable from priors and noted above.   Electronically Signed   By: Jorje Guild M.D.   On: 11/25/2013 22:53    Scheduled Meds: . antiseptic oral rinse  7 mL Mouth Rinse BID  . folic acid  1 mg Oral q morning - 10a  . HYDROmorphone PCA 2 mg/mL   Intravenous 6 times per day  . hydroxyurea  1,500 mg Oral Daily  . levofloxacin (LEVAQUIN) IV  750 mg Intravenous Q24H  . lisinopril  10 mg Oral Daily  . metoprolol tartrate  25 mg Oral Daily  . morphine  30 mg Oral Q12H  . potassium chloride SA  20 mEq Oral q morning - 10a  . rivaroxaban  20 mg Oral Q breakfast   Continuous Infusions: . sodium chloride 75 mL/hr at 11/26/13 1543    Total time spent 40 minutes.

## 2013-11-26 NOTE — Progress Notes (Signed)
CARE MANAGEMENT NOTE 11/26/2013  Patient:  [name], [name]   Account Number:  [id number]  Date Initiated:  11/26/2013  Documentation initiated by:  Karl Bales  Subjective/Objective Assessment:   pt admitted with cco pain, in sickle cell crisis     Action/Plan:   from home   Anticipated DC Date:  11/29/2013   Anticipated DC Plan:  HOME/SELF CARE         Choice offered to / List presented to:             Status of service:   Medicare Important Message given?   (If response is "NO", the following Medicare IM given date fields will be blank) Date Medicare IM given:   Medicare IM given by:   Date Additional Medicare IM given:   Additional Medicare IM given by:    Discharge Disposition:    Per UR Regulation:  Reviewed for med. necessity/level of care/duration of stay  If discussed at [place] of Stay Meetings, dates discussed:    Comments:  11/25/13 MMcGibboney, RN, BSN Chart reviewed.

## 2013-11-26 NOTE — Progress Notes (Signed)
ANTIBIOTIC CONSULT NOTE - INITIAL  Pharmacy Consult for Levaquin Indication: Acute chest syndrome/Wound infection  Allergies  Allergen Reactions  . Morphine And Related Hives and Rash    Pt states he can take PO, but not IV and he is able to tolerate Dilaudid with no reactions.    Patient Measurements: Height: 6' (182.9 cm) Weight: 156 lb 1.6 oz (70.806 kg) IBW/kg (Calculated) : 77.6   Vital Signs: Temp: 98.6 F (37 C) (10/27 0043) Temp Source: Oral (10/27 0043) BP: 125/69 mmHg (10/27 0043) Pulse Rate: 74 (10/27 0043) Intake/Output from previous day:   Intake/Output from this shift:    Labs:  Recent Labs  11/25/13 2153 11/25/13 2212  WBC 16.2*  --   HGB 6.4* 7.5*  PLT 458*  --   CREATININE 0.63 1.30   Estimated Creatinine Clearance: 80.2 ml/min (by C-G formula based on Cr of 1.3). No results found for this basename: VANCOTROUGH, Corlis Leak, VANCORANDOM, [place], GENTPEAK, [place], [place], TOBRAPEAK, TOBRARND, AMIKACINPEAK, AMIKACINTROU, AMIKACIN,  in the last 72 hours   Microbiology: Recent Results (from the past 720 hour(s))  MRSA PCR SCREENING     Status: None   Collection Time    10/28/13  1:59 AM      Result Value Ref Range Status   MRSA by PCR NEGATIVE  NEGATIVE Final   Comment:            The GeneXpert MRSA Assay (FDA     approved for NASAL specimens     only), is one component of a     comprehensive MRSA colonization     surveillance program. It is not     intended to diagnose MRSA     infection nor to guide or     monitor treatment for     MRSA infections.    Medical History: Past Medical History  Diagnosis Date  . Sickle cell anemia   . Blood transfusion   . 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 Hypokalemia   . Mood disorder   . History of pulmonary embolus (PE)   . Avascular necrosis   . Leukocytosis     Chronic  . Thrombocytosis     Chronic  . Hypertension   . History of Clostridium difficile  infection   . Uses marijuana   . Chronic anticoagulation   . Functional asplenia   . Former smoker   . Second hand tobacco smoke exposure   . Alcohol consumption of one to four drinks per day     Medications:  Scheduled:  . folic acid  1 mg Oral q morning - 10a  . HYDROmorphone PCA 2 mg/mL   Intravenous 6 times per day  . hydroxyurea  1,500 mg Oral Daily  . levofloxacin (LEVAQUIN) IV  750 mg Intravenous Q24H  . lisinopril  10 mg Oral Daily  . metoprolol tartrate  25 mg Oral Daily  . morphine  30 mg Oral Q12H  . potassium chloride SA  20 mEq Oral q morning - 10a  . rivaroxaban  20 mg Oral Q breakfast   Infusions:  . sodium chloride     Assessment: [name] c/o chest pain/hypoxic.  Levaquin per Rx for acute chest syndrome and wound infection.   Goal of Therapy:  Treat infection  Plan:   Levaquin 750mg  IV q24h  F/U Scr/cultures as needed  Lawana Pai R 11/26/2013,1:50 AM

## 2013-11-27 LAB — CBC WITH DIFFERENTIAL/PLATELET
BAND NEUTROPHILS: 0 % (ref 0–10)
BASOS ABS: 0 10*3/uL (ref 0.0–0.1)
BASOS PCT: 0 % (ref 0–1)
Blasts: 0 %
Eosinophils Absolute: 0.4 10*3/uL (ref 0.0–0.7)
Eosinophils Relative: 2 % (ref 0–5)
HEMATOCRIT: 20.1 % — AB (ref 39.0–52.0)
HEMOGLOBIN: 6.9 g/dL — AB (ref 13.0–17.0)
LYMPHS PCT: 13 % (ref 12–46)
Lymphs Abs: 2.4 10*3/uL (ref 0.7–4.0)
MCH: 33.2 pg (ref 26.0–34.0)
MCHC: 34.3 g/dL (ref 30.0–36.0)
MCV: 96.6 fL (ref 78.0–100.0)
MYELOCYTES: 0 %
Metamyelocytes Relative: 0 %
Monocytes Absolute: 1.5 10*3/uL — ABNORMAL HIGH (ref 0.1–1.0)
Monocytes Relative: 8 % (ref 3–12)
NEUTROS ABS: 14.5 10*3/uL — AB (ref 1.7–7.7)
Neutrophils Relative %: 77 % (ref 43–77)
Platelets: 511 10*3/uL — ABNORMAL HIGH (ref 150–400)
Promyelocytes Absolute: 0 %
RBC: 2.08 MIL/uL — AB (ref 4.22–5.81)
RDW: 21.6 % — ABNORMAL HIGH (ref 11.5–15.5)
WBC: 18.8 10*3/uL — ABNORMAL HIGH (ref 4.0–10.5)
nRBC: 4 /100 WBC — ABNORMAL HIGH

## 2013-11-27 LAB — LACTATE DEHYDROGENASE: LDH: 526 U/L — ABNORMAL HIGH (ref 94–250)

## 2013-11-27 LAB — BASIC METABOLIC PANEL
Anion gap: 11 (ref 5–15)
BUN: 6 mg/dL (ref 6–23)
CO2: 22 mEq/L (ref 19–32)
CREATININE: 0.52 mg/dL (ref 0.50–1.35)
Calcium: 8.9 mg/dL (ref 8.4–10.5)
Chloride: 103 mEq/L (ref 96–112)
GFR calc non Af Amer: >90 mL/min (ref >90)
Glucose, Bld: 110 mg/dL — ABNORMAL HIGH (ref 70–99)
Potassium: 4.2 mEq/L (ref 3.7–5.3)
Sodium: 136 mEq/L — ABNORMAL LOW (ref 137–147)

## 2013-11-27 LAB — RETICULOCYTES
RBC.: 1.96 MIL/uL — ABNORMAL LOW (ref 4.22–5.81)
Retic Count, Absolute: 431.2 10*3/uL — ABNORMAL HIGH (ref 19.0–186.0)
Retic Ct Pct: 22 % — ABNORMAL HIGH (ref 0.4–3.1)

## 2013-11-27 IMAGING — CR DG CHEST 2V
2 series · 2 of 2 positions shown · non-contrast
Comparison: Two-view chest x-ray and CTA chest [HOSPITAL]
12/16/2010.  Two-view chest x-ray 11/07/2010 [HOSPITAL].

CLINICAL DATA: Sickle cell crisis.  Chest pain.

CHEST - 2 VIEW 01/04/2011:

[w chest pa]
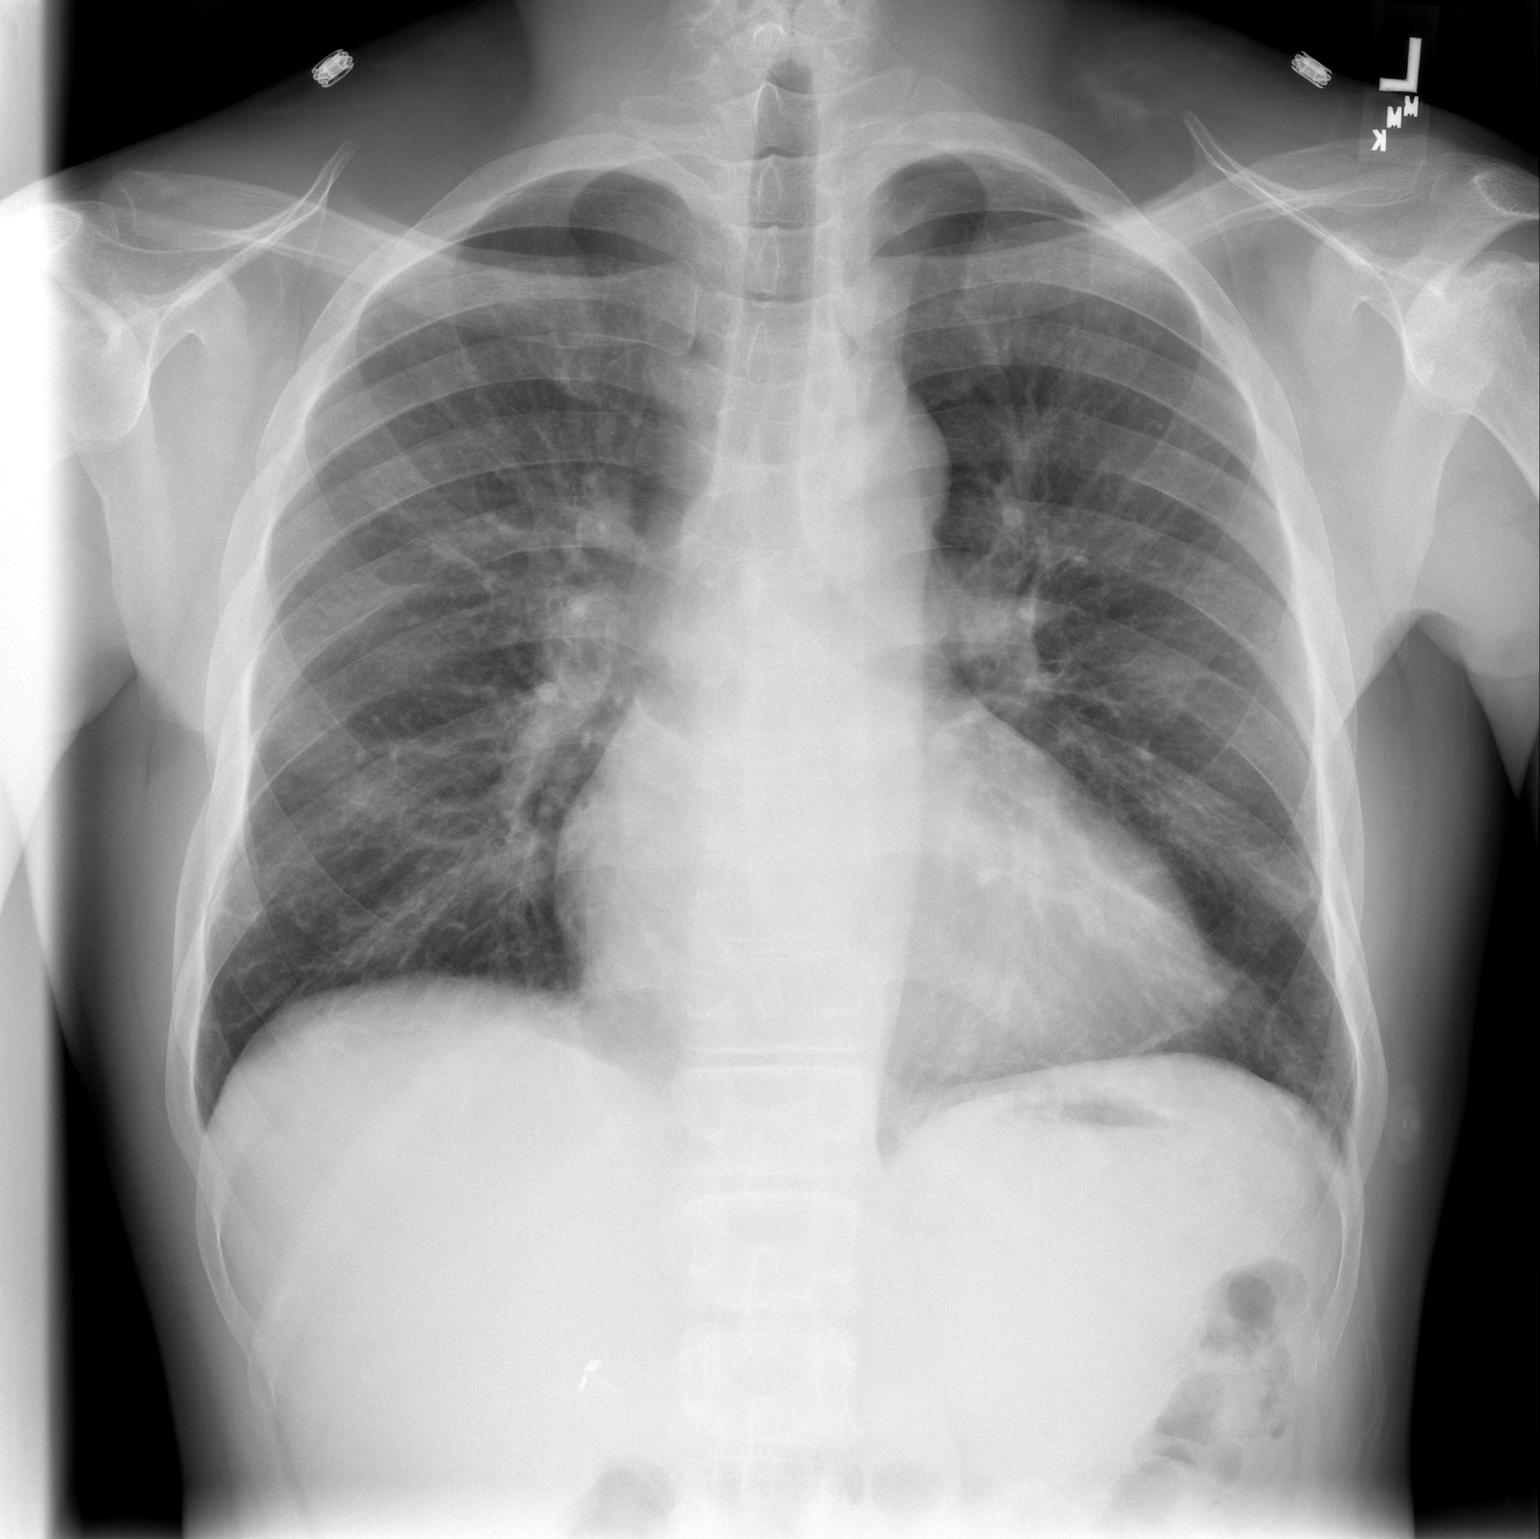

[w chest lat]
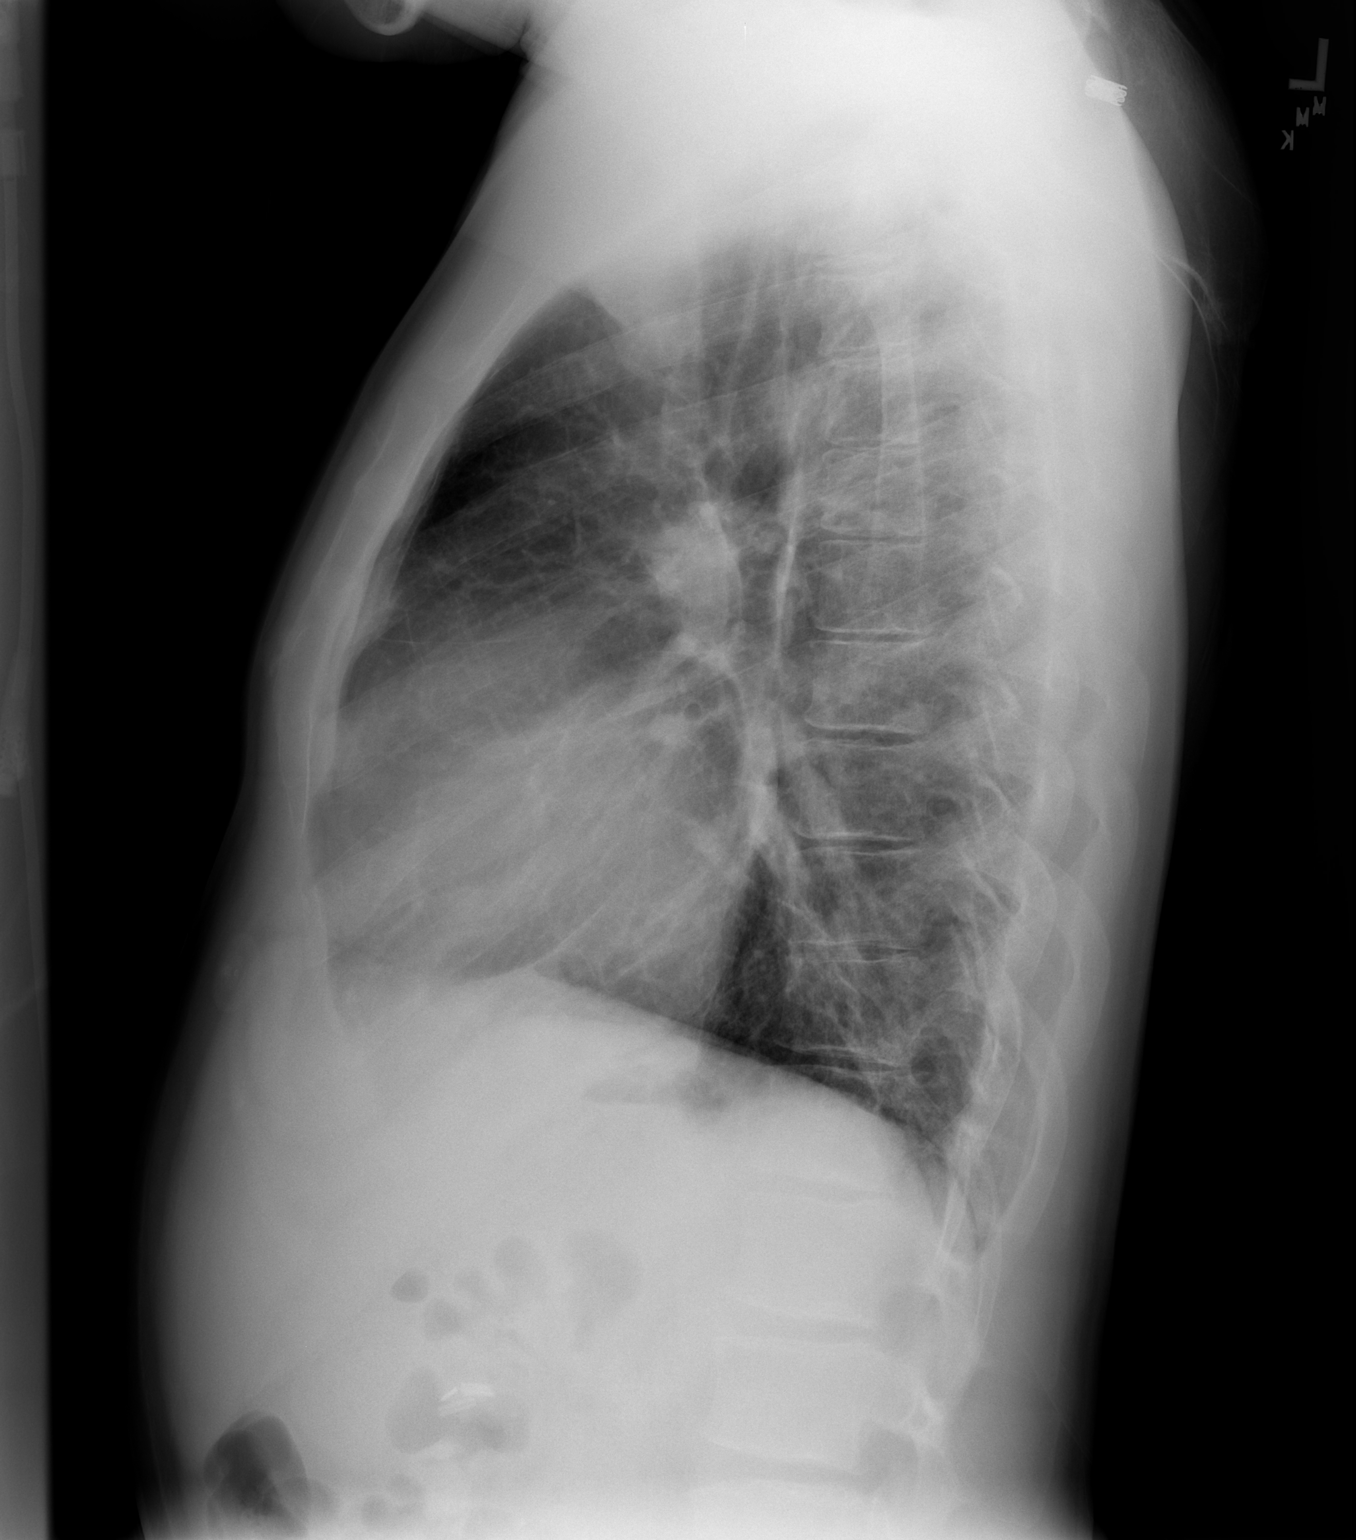

[2 of 2 positions shown; findings below may reference images not displayed]

FINDINGS: Cardiac silhouette mildly enlarged but stable.  Hilar and
mediastinal contours otherwise unremarkable.  Minimal linear
scarring in the lower lobes, unchanged.  No new pulmonary
parenchymal abnormalities.  Pulmonary vascularity normal.  No
pleural effusions.  Visualized bony thorax intact.
IMPRESSION: Stable mild cardiomegaly without pulmonary edema.  Stable minimal
scarring in the lower lobes.  No acute cardiopulmonary disease.

## 2013-11-27 MED ORDER — LEVOFLOXACIN 750 MG PO TABS
750.0000 mg | ORAL_TABLET | Freq: Every day | ORAL | Status: DC
  Administered 2013-11-27 – 2013-11-28 (×2): 750 mg via ORAL
  Filled 2013-11-27 (×2): qty 1

## 2013-11-27 MED ORDER — DIPHENHYDRAMINE HCL 25 MG PO CAPS
25.0000 mg | ORAL_CAPSULE | Freq: Four times a day (QID) | ORAL | Status: DC | PRN
  Administered 2013-11-28: 50 mg via ORAL
  Filled 2013-11-27: qty 2

## 2013-11-27 MED ORDER — DIPHENHYDRAMINE HCL 50 MG/ML IJ SOLN
12.5000 mg | Freq: Once | INTRAMUSCULAR | Status: AC
  Administered 2013-11-27: 12.5 mg via INTRAVENOUS
  Filled 2013-11-27: qty 1

## 2013-11-27 MED ORDER — SODIUM CHLORIDE 0.9 % IJ SOLN
10.0000 mL | INTRAMUSCULAR | Status: DC | PRN
  Administered 2013-11-27 – 2013-11-28 (×3): 10 mL

## 2013-11-27 MED ORDER — KETOROLAC TROMETHAMINE 30 MG/ML IJ SOLN
30.0000 mg | Freq: Four times a day (QID) | INTRAMUSCULAR | Status: DC
  Administered 2013-11-27 – 2013-11-28 (×5): 30 mg via INTRAVENOUS
  Filled 2013-11-27 (×8): qty 1

## 2013-11-27 NOTE — Progress Notes (Signed)
SICKLE CELL SERVICE PROGRESS NOTE  [name] WCH:[id number] DOB: 04-02-1980 DOA: 11/25/2013 PCP: Gerald Bahri A., MD  Assessment/Plan: Principal Problem:   Sickle cell anemia with crisis Active Problems:   Acute chest syndrome   Hx of pulmonary embolus   Pulmonary HTN   HTN (hypertension)   Elevated troponin   Sickle cell pain crisis  1. Hb SS with crisis:  He has not maximized his PCA using only 42.2 mg in 24 hours with 67/67: demands/deliveries. Continue Toradol and decrease IVF as patient is eating and drinking well. 2. Acute Bronchitis: Pt was started on Levaquin by admitting Physician. He is a smoker and is having productive sputum. I will continue to treat empirically with Levaquin. 3. Anemia: Pt's Hb is at it's baseline. Pt has been evaluated for a concurrent Iron deficiency anemia however he has high serum iron levels. Will check transferrin saturation as an out patient , He has missed his last appointments in the clinic and still has not rescheduled despite several calls. Will continue to monitor Hb. 4. Elevated Troponin: I suspect that he had some demand ischemia. ECHO ftrom 05/2013 reviewed and shows normal LVF without regional wall motion abnormalities.  He has similar episodes while hospitalized in the past. He has been referred to cardiology for further evaluation and has missed  2 appointments. 5. Hypokalemia: replete orally 6. Chronic anticoagulation: Continue Xarelto. No evidence of bleeding.  Code Status: Full Code Family Communication: N/A Disposition Plan: Anticipate discharge in the next 24 -48 hours.  [name] A.  Pager [phone]. If 7PM-7AM, please contact night-coverage.  11/27/2013, 6:37 PM  LOS: 2 days   Initial History: [name] is a 33 y.o. male with history of sickle cell disease, pulmonary hypertension and pulmonary embolism on Xarelto, hypertension presents to the ER because of bilateral chest pain and low back pain. Patient  states that he has been having pain off and on for last 1 week but had worsened yesterday morning. Patient denies any associated fever chills or shortness of breath but has been having some productive cough. In the ER patient was found to have positive troponin with EKG showing atrial lead T-wave inversion. CT angiography of the chest was done which was unremarkable and patient has been admitted for sickle cell pain crisis. Patient otherwise denies any headache visual symptoms or any focal deficits. Patient has had previous episodes of elevated troponin in last May and at that time patient was planned to have outpatient stress test but patient had not followed up   Consultants:  None  Procedures:  None  Antibiotics:  Levaquin 10/27 >>  HPI/Subjective: Pt states that his pain is improved in the rib area but persists in the back and right thigh. He currently rates that pain as 6/10 but reports that he feels fatigued. Last BM 2 days ago. Objective: Filed Vitals:   11/27/13 0749 11/27/13 1122 11/27/13 1421 11/27/13 1614  BP:   121/74   Pulse:   66   Temp:   98.3 F (36.8 C)   TempSrc:   Oral   Resp: 13 17 18 17   Height:      Weight:      SpO2: 97% 97% 97% 99%   Weight change:   Intake/Output Summary (Last 24 hours) at 11/27/13 1837 Last data filed at 11/27/13 1739  Gross per 24 hour  Intake   2650 ml  Output   3075 ml  Net   -425 ml    General: Alert, awake, oriented  x3, in no acute distress.  HEENT: [place]/AT PEERL, EOMI, mild icterus Neck: Trachea midline,  no masses, no thyromegal,y no JVD, no carotid bruit OROPHARYNX:  Moist, No exudate/ erythema/lesions.  Heart: Regular rate and rhythm, without murmurs, rubs, gallops, PMI non-displaced, no heaves or thrills on palpation.  Lungs: Clear to auscultation, no wheezing or rhonchi noted. No increased vocal fremitus resonant to percussion  Abdomen: Soft, nontender, nondistended, positive bowel sounds, no masses no  hepatosplenomegaly noted.  Neuro: No focal neurological deficits noted cranial nerves II through XII grossly intact. DTRs 2+ bilaterally upper and lower extremities. Strength normal in bilateral upper and lower extremities. Musculoskeletal: No warm swelling or erythema around joints. Psychiatric: Patient alert and oriented x3, good insight and cognition, good recent to remote recall. Lymph node survey: No cervical axillary or inguinal lymphadenopathy noted.   Data Reviewed: Basic Metabolic Panel:  Recent Labs Lab 11/25/13 2153 11/25/13 2212 11/26/13 0220 11/27/13 0540  NA 139 142 140 136*  K 3.4* 3.3* 3.5* 4.2  CL 104 105 106 103  CO2 22  --  23 22  GLUCOSE 101* 103* 130* 110*  BUN 9 7 8 6   CREATININE 0.63 1.30 0.65 0.52  CALCIUM 8.7  --  8.6 8.9   Liver Function Tests:  Recent Labs Lab 11/25/13 2153 11/26/13 0220  AST 34 34  ALT 18 18  ALKPHOS 81 81  BILITOT 5.6* 5.5*  PROT 7.0 7.2  ALBUMIN 3.7 3.7   No results found for this basename: LIPASE, AMYLASE,  in the last 168 hours No results found for this basename: AMMONIA,  in the last 168 hours CBC:  Recent Labs Lab 11/25/13 2153 11/25/13 2212 11/26/13 0220 11/27/13 0540  WBC 16.2*  --  17.7* 18.8*  NEUTROABS 10.5*  --  11.4* 14.5*  HGB 6.4* 7.5* 6.5* 6.9*  HCT 18.8* 22.0* 18.8* 20.1*  MCV 97.4  --  99.5 96.6  PLT 458*  --  422* 511*   Cardiac Enzymes:  Recent Labs Lab 11/25/13 2153 11/26/13 0220 11/26/13 0625 11/26/13 1325  TROPONINI 0.49* 0.44* 0.38* 0.32*   BNP (last 3 results) No results found for this basename: PROBNP,  in the last 8760 hours CBG: No results found for this basename: GLUCAP,  in the last 168 hours  No results found for this or any previous visit (from the past 240 hour(s)).   Studies: Dg Chest 2 View  10/30/2013   CLINICAL DATA:  Difficulty breathing and chest pain; sickle cell disease  EXAM: CHEST  2 VIEW  COMPARISON:  October 27, 2013  FINDINGS: Port-A-Cath tip is at the  cavoatrial junction. No pneumothorax. Areas of mild scarring are noted bilaterally. There is no edema or consolidation. Heart is borderline enlarged with pulmonary vascularity showing mild pulmonary venous hypertension. No adenopathy. There are no appreciable bone lesions.  IMPRESSION: Areas of mild scarring bilaterally. Evidence of mild pulmonary venous congestion, a finding common with sickle cell disease. No frank edema or consolidation.   Electronically Signed   By: Lowella Grip M.D.   On: 10/30/2013 12:16   Ct Angio Chest Pe W/cm &/or Wo Cm  11/25/2013   CLINICAL DATA:  Sickle cell trait. Lower back and bilateral leg pain. Mild shortness of breath. Initial encounter  EXAM: CT ANGIOGRAPHY CHEST WITH CONTRAST  TECHNIQUE: Multidetector CT imaging of the chest was performed using the standard protocol during bolus administration of intravenous contrast. Multiplanar CT image reconstructions and MIPs were obtained to evaluate the vascular anatomy.  CONTRAST:  173mL OMNIPAQUE IOHEXOL 350 MG/ML SOLN  COMPARISON:  08/26/2013  FINDINGS: THORACIC INLET/BODY WALL:  Left subclavian porta catheter in unremarkable position. No acute findings.  MEDIASTINUM:  Chronic cardiomegaly. No pericardial effusion. There is right heart enlargement and main pulmonary artery enlargement (42 mm) suggesting pulmonary hypertension. As permitted by bolus dispersion and intermittent respiratory motion, no pulmonary embolism. No aortic dissection or aneurysm. Chronic mediastinal lymphadenopathy, especially superior and anterior mediastinal. Thymus remains visible and stable from previous imaging.  LUNG WINDOWS:  There is chronic interstitial opacities in the lower lungs with volume loss, likely a combination of scarring and atelectasis. Patchy reticular nodular opacities are also chronic. No definitive pneumonia or edema. No effusion or pneumothorax.  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  OSSEOUS:  Typical osseous changes of sickle cell disease with thoracic endplate concavities and bilateral humeral head osteonecrosis.  Review of the MIP images confirms the above findings.  IMPRESSION: 1. Negative for pulmonary embolism. 2. Chronic lung disease without definite pneumonia or edema. 3. Chronic findings are stable from priors and noted above.   Electronically Signed   By: Jorje Guild M.D.   On: 11/25/2013 22:53    Scheduled Meds: . antiseptic oral rinse  7 mL Mouth Rinse BID  . folic acid  1 mg Oral q morning - 10a  . HYDROmorphone PCA 2 mg/mL   Intravenous 6 times per day  . hydroxyurea  1,500 mg Oral Daily  . ketorolac  30 mg Intravenous 4 times per day  . levofloxacin (LEVAQUIN) IV  750 mg Intravenous Q24H  . lisinopril  10 mg Oral Daily  . metoprolol tartrate  25 mg Oral Daily  . morphine  30 mg Oral Q12H  . potassium chloride SA  20 mEq Oral q morning - 10a  . rivaroxaban  20 mg Oral Q breakfast   Continuous Infusions: . sodium chloride 75 mL/hr at 11/27/13 1739    Total time spent 35 minutes.

## 2013-11-28 ENCOUNTER — Telehealth: Payer: Self-pay

## 2013-11-28 DIAGNOSIS — R072 Precordial pain: Secondary | ICD-10-CM

## 2013-11-28 DIAGNOSIS — D5700 Hb-SS disease with crisis, unspecified: Secondary | ICD-10-CM

## 2013-11-28 LAB — CBC WITH DIFFERENTIAL/PLATELET
BASOS ABS: 0 10*3/uL (ref 0.0–0.1)
Basophils Relative: 0 % (ref 0–1)
EOS ABS: 0.5 10*3/uL (ref 0.0–0.7)
Eosinophils Relative: 3 % (ref 0–5)
HCT: 19.3 % — ABNORMAL LOW (ref 39.0–52.0)
Hemoglobin: 6.7 g/dL — CL (ref 13.0–17.0)
LYMPHS ABS: 2.2 10*3/uL (ref 0.7–4.0)
Lymphocytes Relative: 12 % (ref 12–46)
MCH: 33.5 pg (ref 26.0–34.0)
MCHC: 34.7 g/dL (ref 30.0–36.0)
MCV: 96.5 fL (ref 78.0–100.0)
MONO ABS: 2.2 10*3/uL — AB (ref 0.1–1.0)
Monocytes Relative: 12 % (ref 3–12)
NEUTROS ABS: 13.4 10*3/uL — AB (ref 1.7–7.7)
Neutrophils Relative %: 73 % (ref 43–77)
PLATELETS: 447 10*3/uL — AB (ref 150–400)
RBC: 2 MIL/uL — ABNORMAL LOW (ref 4.22–5.81)
RDW: 21.3 % — AB (ref 11.5–15.5)
WBC: 18.3 10*3/uL — ABNORMAL HIGH (ref 4.0–10.5)
nRBC: 3 /100 WBC — ABNORMAL HIGH

## 2013-11-28 MED ORDER — HEPARIN SOD (PORK) LOCK FLUSH 100 UNIT/ML IV SOLN
500.0000 [IU] | INTRAVENOUS | Status: AC | PRN
  Administered 2013-11-28: 500 [IU]

## 2013-11-28 MED ORDER — HYDROMORPHONE HCL 4 MG PO TABS: 4.0000 mg | ORAL_TABLET | ORAL | Status: DC | PRN

## 2013-11-28 MED ORDER — LEVOFLOXACIN 750 MG PO TABS: 750.0000 mg | ORAL_TABLET | Freq: Every day | ORAL | Status: DC

## 2013-11-28 NOTE — Telephone Encounter (Signed)
Refill request for Dilaudid 4mg . LOV 08/22/2013. Please advise. Thanks!

## 2013-11-28 NOTE — Progress Notes (Signed)
Wasted 10 ML of dilaudid with Horton Chin, RN. [place], Marchelle Folks

## 2013-11-28 NOTE — Telephone Encounter (Signed)
Prescription filled for Dilaudid 4 mg #90 tabs

## 2013-11-28 NOTE — Discharge Summary (Signed)
Physician Discharge Summary  Patient ID: [name] MRN: [id number] DOB/AGE: May 05, 1980 33 y.o.  Admit date: 11/25/2013 Discharge date: 11/28/2013  Admission Diagnoses:  Discharge Diagnoses:  Principal Problem:   Sickle cell anemia with crisis Active Problems:   Hx of pulmonary embolus   Acute chest syndrome   Pulmonary HTN   HTN (hypertension)   Elevated troponin   Sickle cell pain crisis   Discharged Condition: good  Hospital Course: patient admitted with sickle cell painful crisis and acute Bronchitis. Has had no fever or chills, no NVD. Was having cough and SOB. Was treated with IV dilaudid by PCA, Toradol, IVF and supportive care. He was discharged home in good health to follow with Dr Zigmund Daniel in the clinic. He was to take 5 more days of Levaquin to complete his treatment.   Consults: None  Significant Diagnostic Studies: labs: CBC, CMP as well as CXR done. No evidence of pneumonia or hemolysis.  Treatments: IV hydration, antibiotics: Levaquin and analgesia: Dilaudid  Discharge Exam: Blood pressure 121/73, pulse 83, temperature 99.2 F (37.3 C), temperature source Oral, resp. rate 18, height 6' (1.829 m), weight 70.806 kg (156 lb 1.6 oz), SpO2 92.00%. General appearance: alert, cooperative and no distress Head: Normocephalic, without obvious abnormality, atraumatic Neck: no adenopathy, no carotid bruit, no JVD, supple, symmetrical, trachea midline and thyroid not enlarged, symmetric, no tenderness/mass/nodules Resp: clear to auscultation bilaterally Chest wall: no tenderness Cardio: regular rate and rhythm, S1, S2 normal, no murmur, click, rub or gallop GI: soft, non-tender; bowel sounds normal; no masses,  no organomegaly Extremities: extremities normal, atraumatic, no cyanosis or edema Pulses: 2+ and symmetric Neurologic: Grossly normal  Disposition: 01-Home or Self Care     Medication List         folic acid 1 MG tablet  Commonly known as:  FOLVITE   Take 1 tablet (1 mg total) by mouth every morning.     HYDROmorphone 4 MG tablet  Commonly known as:  DILAUDID  Take 1 tablet (4 mg total) by mouth every 4 (four) hours as needed for severe pain.     hydroxyurea 500 MG capsule  Commonly known as:  HYDREA  Take 3 capsules (1,500 mg total) by mouth daily. May take with food to minimize GI side effects.     levofloxacin 750 MG tablet  Commonly known as:  LEVAQUIN  Take 1 tablet (750 mg total) by mouth daily.     lisinopril 10 MG tablet  Commonly known as:  PRINIVIL,ZESTRIL  Take 1 tablet (10 mg total) by mouth daily.     metoprolol tartrate 25 MG tablet  Commonly known as:  LOPRESSOR  Take 1 tablet (25 mg total) by mouth daily.     morphine 30 MG 12 hr tablet  Commonly known as:  MS CONTIN  Take 1 tablet (30 mg total) by mouth every 12 (twelve) hours.     potassium chloride SA 20 MEQ tablet  Commonly known as:  K-DUR,KLOR-CON  Take 1 tablet (20 mEq total) by mouth every morning.     rivaroxaban 20 MG Tabs tablet  Commonly known as:  XARELTO  Take 1 tablet (20 mg total) by mouth every morning.     zolpidem 10 MG tablet  Commonly known as:  AMBIEN  Take 1 tablet (10 mg total) by mouth at bedtime as needed for sleep.         SignedBarbette Merino 11/28/2013, 11:06 AM

## 2013-11-29 ENCOUNTER — Ambulatory Visit: Payer: Self-pay | Admitting: Internal Medicine

## 2013-11-29 IMAGING — CR DG CHEST 1V PORT
1 series · 1 of 1 positions shown · non-contrast
Comparison: January 04, 2011

CLINICAL DATA: PICC line placement

PORTABLE CHEST - 1 VIEW

[view not recorded]
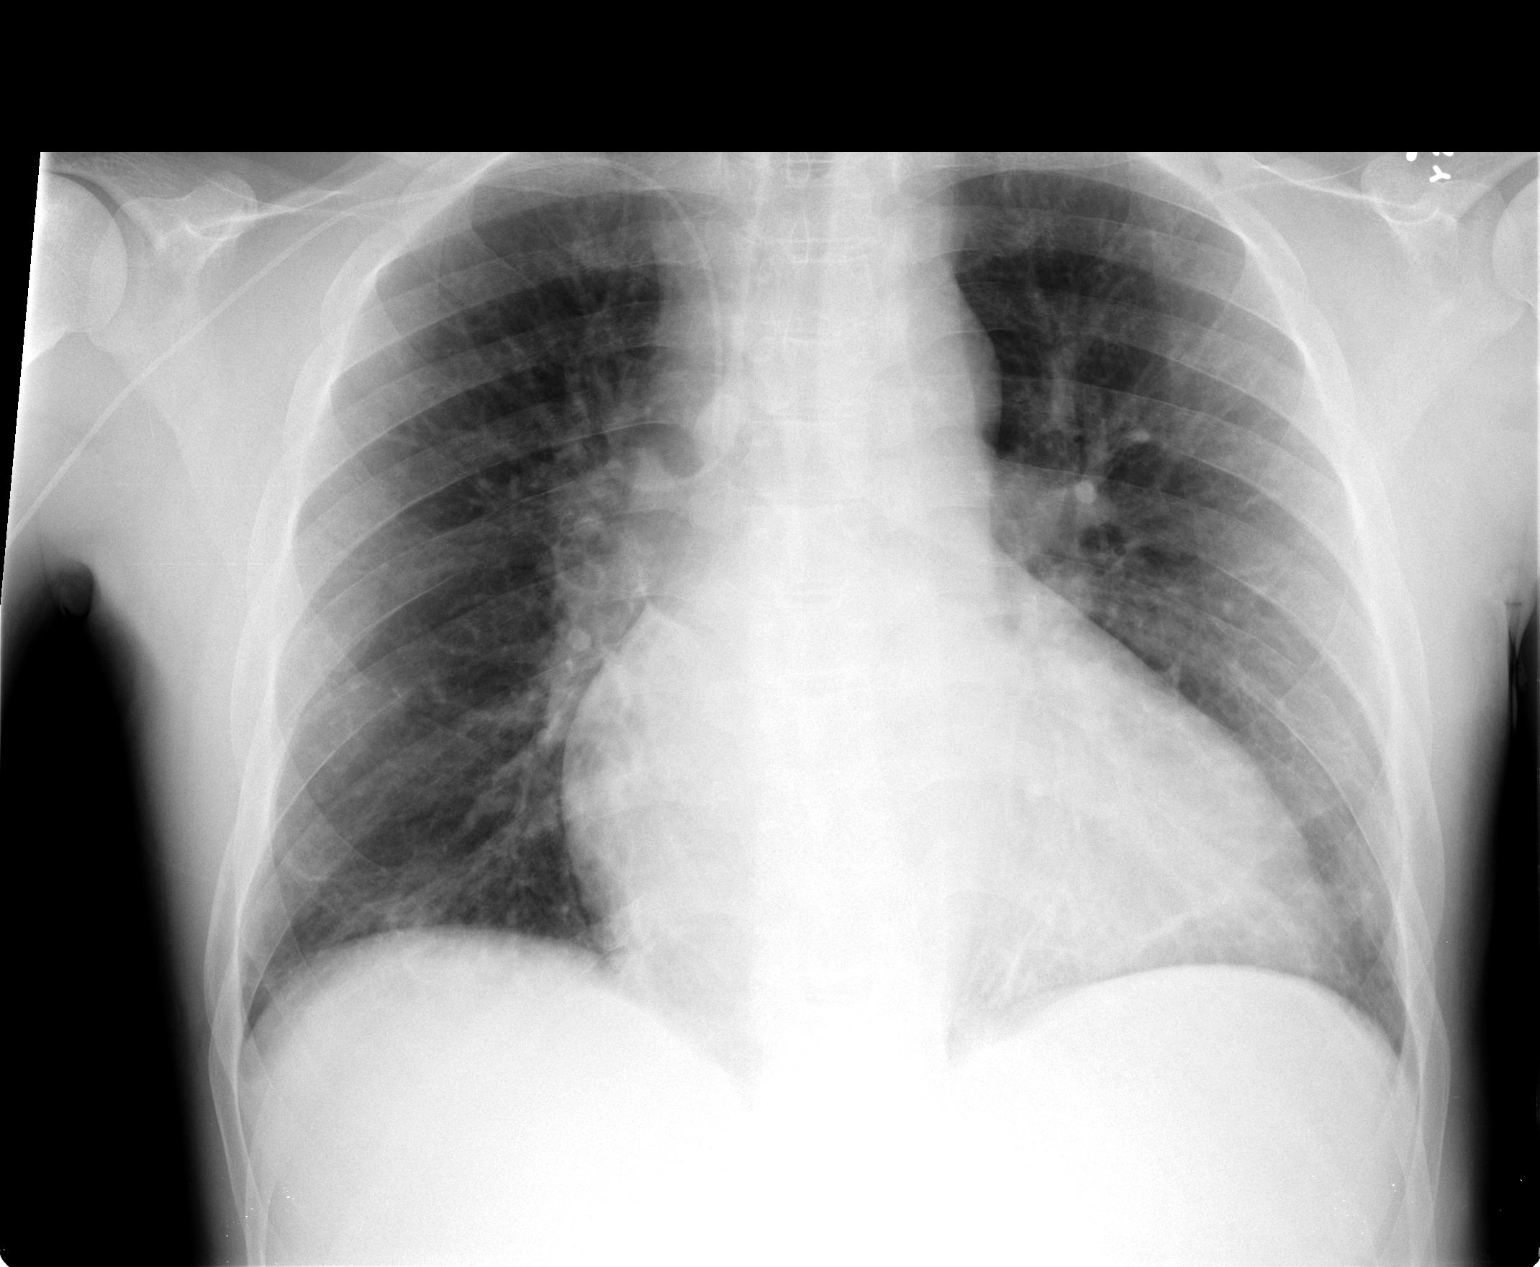

[1 of 1 positions shown; findings below may reference images not displayed]

FINDINGS: The right upper extremity PICC line tip is at the
cavoatrial junction.  No pneumothorax.  Cardiomegaly persists.  The
mediastinum and pulmonary vasculature are within normal limits.
Both lungs are clear.
IMPRESSION: PICC line tip at cavoatrial junction.  No pneumothorax.

Cardiomegaly.

## 2013-11-30 LAB — TYPE AND SCREEN
ABO/RH(D): O POS
ANTIBODY SCREEN: NEGATIVE
UNIT DIVISION: 0

## 2013-12-04 IMAGING — US US ABDOMEN COMPLETE
1 series · 13 of 25 positions shown · non-contrast
Comparison: Abdominal ultrasound 06/02/2010CT abdomen pelvis
06/01/2009

CLINICAL DATA: Right upper quadrant pain.  Sickle cell disease.

ABDOMINAL ULTRASOUND COMPLETE

[Series 1: us abdomen complete · 0.22mm/px · 13 of 67 slices shown]
[im 1/67]
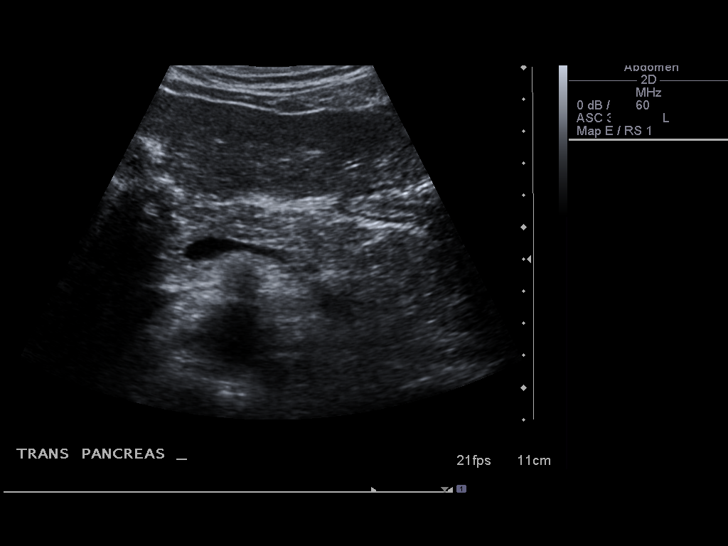
[im 6/67]
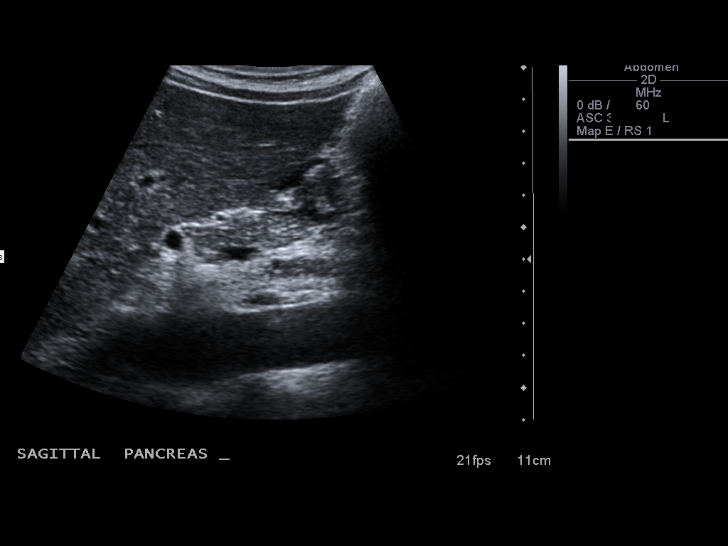
[im 12/67]
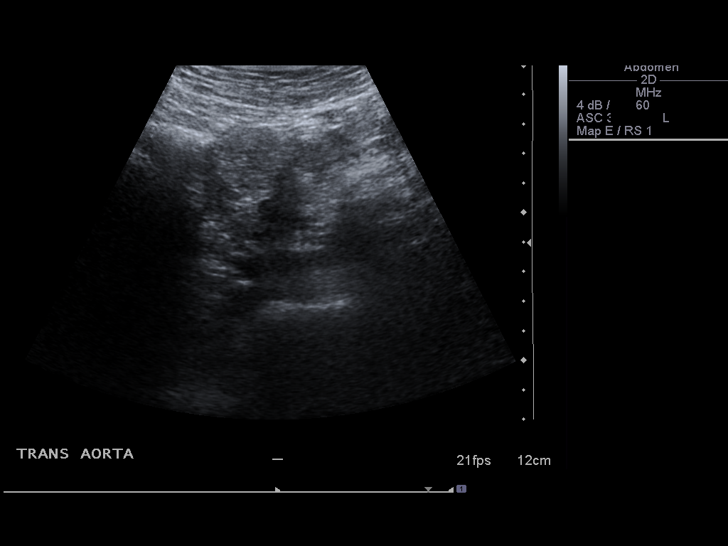
[im 17/67]
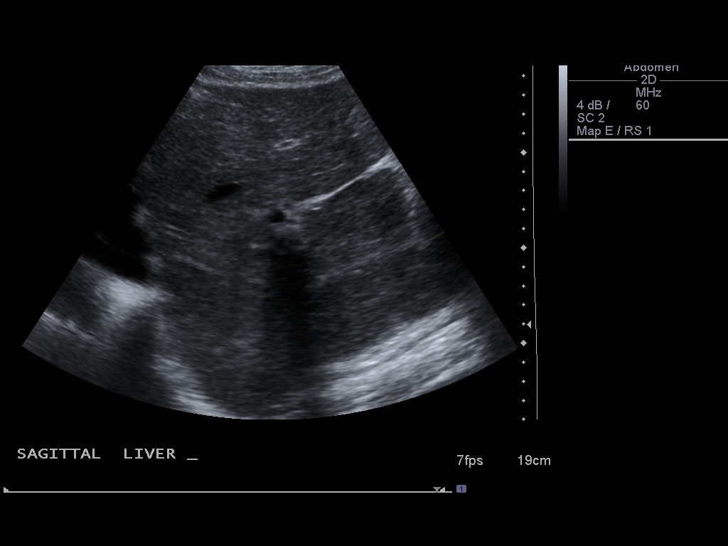
[im 23/67]
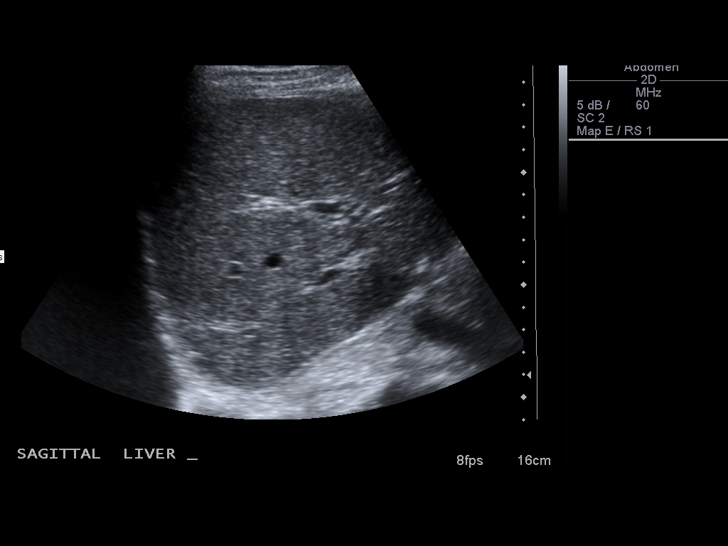
[im 28/67]
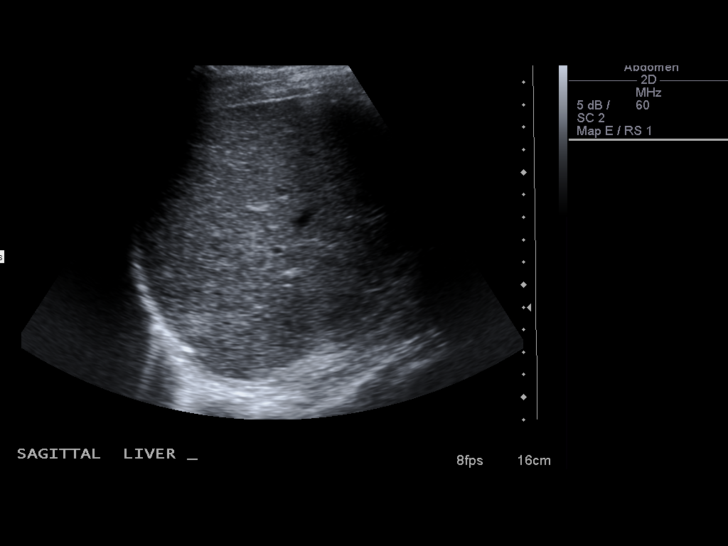
[im 34/67]
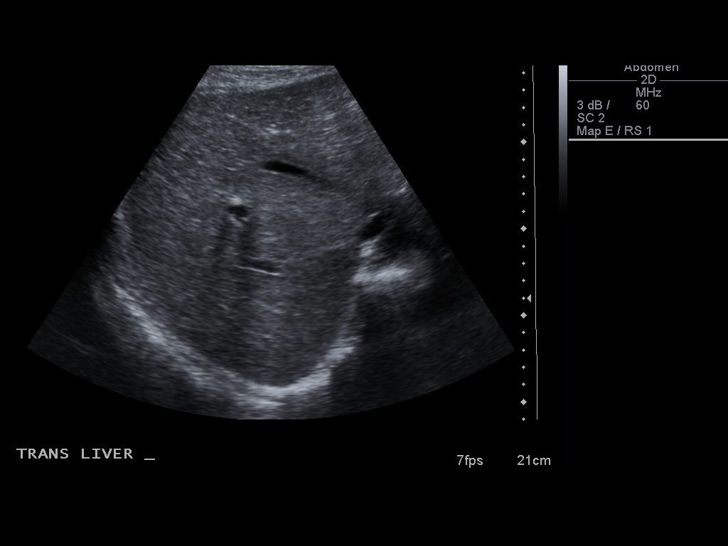
[im 39/67]
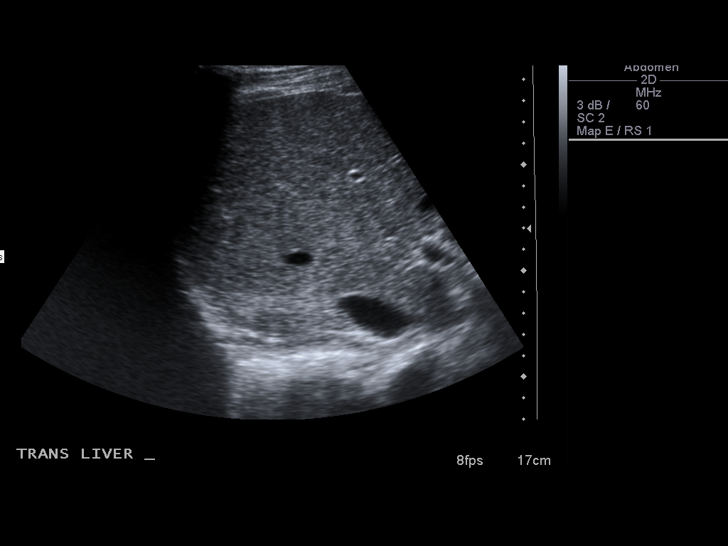
[im 45/67]
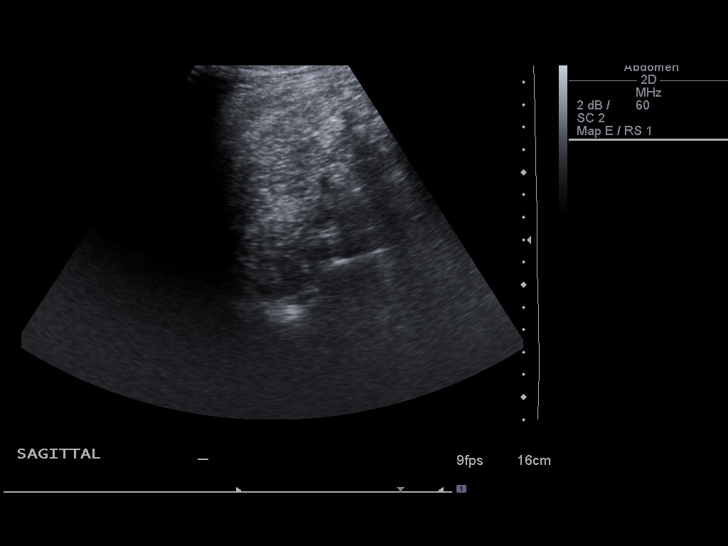
[im 50/67]
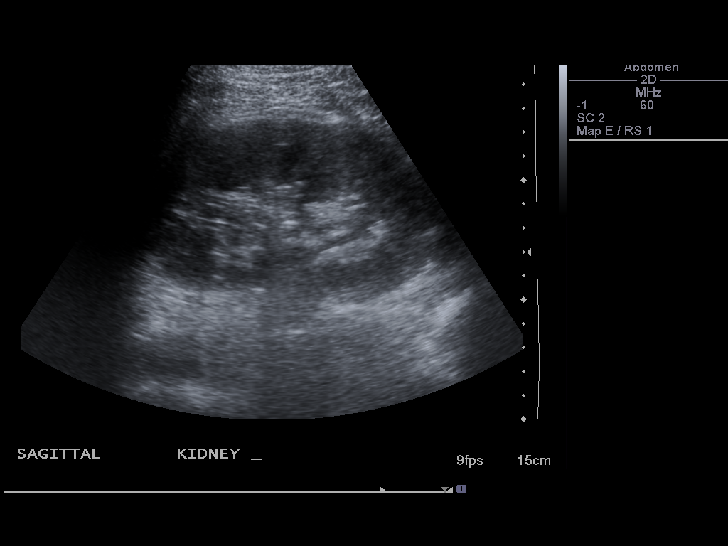
[im 56/67]
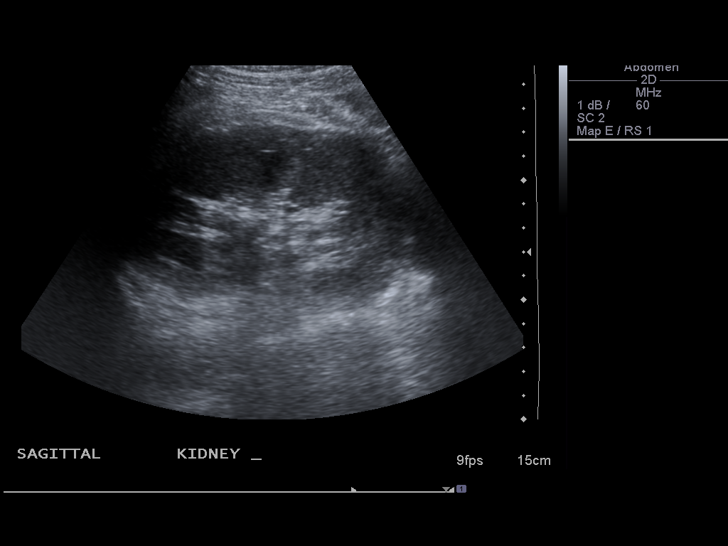
[im 61/67]
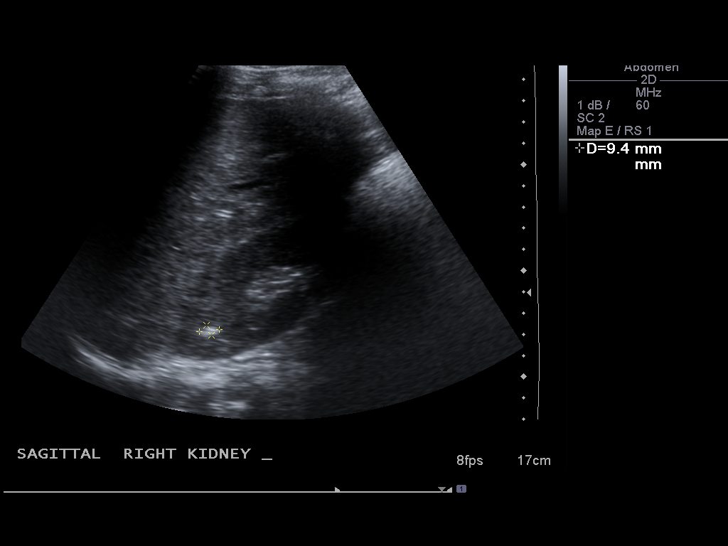
[im 67/67]
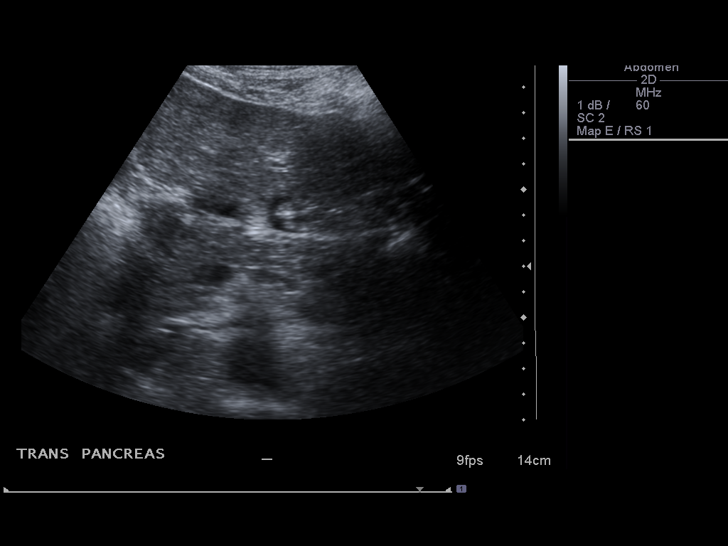

[13 of 25 positions shown; findings below may reference images not displayed]

FINDINGS: Gallbladder:  Surgically absent.

Common Bile Duct:  Within normal limits in caliber. Measures
mm.

Liver: No focal mass lesion identified.  Within normal limits in
parenchymal echogenicity.

IVC:  Appears normal.

Pancreas:  Although the pancreas is difficult to visualize in its
entirety, no focal pancreatic abnormality is identified.

Spleen:  The spleen is normal in size, measuring 5.8 cm
craniocaudal span.  The echotexture of the spleen is heterogeneous,
with areas of increased echogenicity.  There are some associated
areas of shadowing within the spleen.

Right kidney:  Normal in size and parenchymal echogenicity.  No
evidence of suspicious mass or hydronephrosis.There is a 9 mm focal
area of increased echogenicity within the upper pole of the right
kidney, unchanged compared to ultrasound of 06/02/2010.This does
not shadow, and is therefore not definitely a stone.

Left kidney:  Normal in size and parenchymal echogenicity.  No
evidence of mass or hydronephrosis.

Abdominal Aorta:  No aneurysm identified.

An oval structure adjacent to the superior aspect of the pancreatic
head measures 1.8 x 0.7 x 1.6 cm, most consistent with a
periportal/peripancreatic lymph node, and is measures slightly
smaller compared to ultrasound of 06/02/2010.

No ascites is identified.
IMPRESSION: 1.  No acute findings compared to prior studies.
2.  Heterogeneous and increased echogenicity of the spleen suggests
prior infarct(s) with associated calcifications, as seen on prior
CT.
3.  Porta hepatis/peripancreatic lymph node is similar to slightly
smaller compared to prior exam.

4.  Stable 9 mm focal echogenicity in the right kidney. Small stone
not excluded, but not definite as it does not shadow.

## 2013-12-12 ENCOUNTER — Telehealth (HOSPITAL_COMMUNITY): Payer: Self-pay | Admitting: *Deleted

## 2013-12-12 ENCOUNTER — Other Ambulatory Visit: Payer: Self-pay | Admitting: Hematology

## 2013-12-12 ENCOUNTER — Other Ambulatory Visit: Payer: Self-pay | Admitting: Internal Medicine

## 2013-12-12 ENCOUNTER — Non-Acute Institutional Stay (HOSPITAL_COMMUNITY)
Admission: AD | Admit: 2013-12-12 | Discharge: 2013-12-12 | Disposition: A | Discharge status: Home / Self Care | Payer: Medicare Other | Attending: Internal Medicine | Admitting: Internal Medicine

## 2013-12-12 DIAGNOSIS — I10 Essential (primary) hypertension: Secondary | ICD-10-CM | POA: Diagnosis not present

## 2013-12-12 DIAGNOSIS — Q8901 Asplenia (congenital): Secondary | ICD-10-CM | POA: Insufficient documentation

## 2013-12-12 DIAGNOSIS — D72829 Elevated white blood cell count, unspecified: Secondary | ICD-10-CM | POA: Insufficient documentation

## 2013-12-12 DIAGNOSIS — D571 Sickle-cell disease without crisis: Secondary | ICD-10-CM | POA: Diagnosis not present

## 2013-12-12 DIAGNOSIS — Z885 Allergy status to narcotic agent status: Secondary | ICD-10-CM | POA: Insufficient documentation

## 2013-12-12 DIAGNOSIS — F1290 Cannabis use, unspecified, uncomplicated: Secondary | ICD-10-CM | POA: Diagnosis not present

## 2013-12-12 DIAGNOSIS — D5700 Hb-SS disease with crisis, unspecified: Secondary | ICD-10-CM | POA: Diagnosis present

## 2013-12-12 DIAGNOSIS — D473 Essential (hemorrhagic) thrombocythemia: Secondary | ICD-10-CM | POA: Insufficient documentation

## 2013-12-12 DIAGNOSIS — Z87891 Personal history of nicotine dependence: Secondary | ICD-10-CM | POA: Insufficient documentation

## 2013-12-12 DIAGNOSIS — Z7901 Long term (current) use of anticoagulants: Secondary | ICD-10-CM | POA: Diagnosis not present

## 2013-12-12 DIAGNOSIS — D57819 Other sickle-cell disorders with crisis, unspecified: Secondary | ICD-10-CM | POA: Diagnosis not present

## 2013-12-12 DIAGNOSIS — F39 Unspecified mood [affective] disorder: Secondary | ICD-10-CM | POA: Insufficient documentation

## 2013-12-12 DIAGNOSIS — E860 Dehydration: Secondary | ICD-10-CM | POA: Diagnosis not present

## 2013-12-12 DIAGNOSIS — G894 Chronic pain syndrome: Secondary | ICD-10-CM | POA: Insufficient documentation

## 2013-12-12 LAB — COMPREHENSIVE METABOLIC PANEL
ALBUMIN: 4 g/dL (ref 3.5–5.2)
ALT: 18 U/L (ref 0–53)
ANION GAP: 11 (ref 5–15)
AST: 35 U/L (ref 0–37)
Alkaline Phosphatase: 77 U/L (ref 39–117)
BILIRUBIN TOTAL: 7 mg/dL — AB (ref 0.3–1.2)
BUN: 10 mg/dL (ref 6–23)
CHLORIDE: 104 meq/L (ref 96–112)
CO2: 22 mEq/L (ref 19–32)
CREATININE: 0.57 mg/dL (ref 0.50–1.35)
Calcium: 9.2 mg/dL (ref 8.4–10.5)
GFR calc non Af Amer: >90 mL/min (ref >90)
GLUCOSE: 98 mg/dL (ref 70–99)
POTASSIUM: 3.7 meq/L (ref 3.7–5.3)
Sodium: 137 mEq/L (ref 137–147)
Total Protein: 7.5 g/dL (ref 6.0–8.3)

## 2013-12-12 LAB — RETICULOCYTES
RBC.: 2.2 MIL/uL — AB (ref 4.22–5.81)
Retic Count, Absolute: 257.4 10*3/uL — ABNORMAL HIGH (ref 19.0–186.0)
Retic Ct Pct: 11.7 % — ABNORMAL HIGH (ref 0.4–3.1)

## 2013-12-12 LAB — LACTATE DEHYDROGENASE: LDH: 526 U/L — ABNORMAL HIGH (ref 94–250)

## 2013-12-12 LAB — CBC WITH DIFFERENTIAL/PLATELET
BASOS ABS: 0 10*3/uL (ref 0.0–0.1)
Basophils Relative: 0 % (ref 0–1)
EOS ABS: 0.5 10*3/uL (ref 0.0–0.7)
Eosinophils Relative: 3 % (ref 0–5)
HCT: 20.8 % — ABNORMAL LOW (ref 39.0–52.0)
HEMOGLOBIN: 7.4 g/dL — AB (ref 13.0–17.0)
Lymphocytes Relative: 10 % — ABNORMAL LOW (ref 12–46)
Lymphs Abs: 1.5 10*3/uL (ref 0.7–4.0)
MCH: 33.6 pg (ref 26.0–34.0)
MCHC: 35.6 g/dL (ref 30.0–36.0)
MCV: 94.5 fL (ref 78.0–100.0)
MONO ABS: 1.7 10*3/uL — AB (ref 0.1–1.0)
MONOS PCT: 11 % (ref 3–12)
NRBC: 1 /100{WBCs} — AB
Neutro Abs: 11.3 10*3/uL — ABNORMAL HIGH (ref 1.7–7.7)
Neutrophils Relative %: 76 % (ref 43–77)
PLATELETS: 431 10*3/uL — AB (ref 150–400)
RBC: 2.2 MIL/uL — AB (ref 4.22–5.81)
RDW: 19.8 % — ABNORMAL HIGH (ref 11.5–15.5)
WBC: 15 10*3/uL — ABNORMAL HIGH (ref 4.0–10.5)

## 2013-12-12 MED ORDER — DIPHENHYDRAMINE HCL 50 MG/ML IJ SOLN: 25.0000 mg | Freq: Four times a day (QID) | INTRAMUSCULAR | Status: DC | PRN

## 2013-12-12 MED ORDER — KETOROLAC TROMETHAMINE 30 MG/ML IJ SOLN
30.0000 mg | Freq: Four times a day (QID) | INTRAMUSCULAR | Status: DC | PRN
  Administered 2013-12-12: 30 mg via INTRAVENOUS
  Filled 2013-12-12: qty 1

## 2013-12-12 MED ORDER — SENNOSIDES-DOCUSATE SODIUM 8.6-50 MG PO TABS: 1.0000 | ORAL_TABLET | Freq: Two times a day (BID) | ORAL | Status: DC

## 2013-12-12 MED ORDER — POLYETHYLENE GLYCOL 3350 17 G PO PACK
17.0000 g | PACK | Freq: Every day | ORAL | Status: DC | PRN
  Filled 2013-12-12: qty 1

## 2013-12-12 MED ORDER — NALOXONE HCL 0.4 MG/ML IJ SOLN: 0.4000 mg | INTRAMUSCULAR | Status: DC | PRN

## 2013-12-12 MED ORDER — MORPHINE SULFATE ER 30 MG PO TBCR: 30.0000 mg | EXTENDED_RELEASE_TABLET | Freq: Two times a day (BID) | ORAL | Status: DC

## 2013-12-12 MED ORDER — ONDANSETRON HCL 4 MG/2ML IJ SOLN: 4.0000 mg | INTRAMUSCULAR | Status: DC | PRN

## 2013-12-12 MED ORDER — SODIUM CHLORIDE 0.9 % IJ SOLN: 9.0000 mL | INTRAMUSCULAR | Status: DC | PRN

## 2013-12-12 MED ORDER — HYDROMORPHONE 2 MG/ML HIGH CONCENTRATION IV PCA SOLN
INTRAVENOUS | Status: DC
  Administered 2013-12-12: 10:00:00 via INTRAVENOUS
  Filled 2013-12-12: qty 25

## 2013-12-12 MED ORDER — SODIUM CHLORIDE 0.9 % IV SOLN: 12.5000 mg | Freq: Four times a day (QID) | INTRAVENOUS | Status: DC | PRN

## 2013-12-12 MED ORDER — HEPARIN SOD (PORK) LOCK FLUSH 100 UNIT/ML IV SOLN
500.0000 [IU] | INTRAVENOUS | Status: AC | PRN
  Administered 2013-12-12: 500 [IU]
  Filled 2013-12-12: qty 5

## 2013-12-12 MED ORDER — FOLIC ACID 1 MG PO TABS
1.0000 mg | ORAL_TABLET | Freq: Every day | ORAL | Status: DC
  Administered 2013-12-12: 1 mg via ORAL
  Filled 2013-12-12: qty 1

## 2013-12-12 MED ORDER — DEXTROSE-NACL 5-0.45 % IV SOLN
INTRAVENOUS | Status: DC
  Administered 2013-12-12: 125 mL/h via INTRAVENOUS

## 2013-12-12 MED ORDER — ONDANSETRON HCL 4 MG/2ML IJ SOLN: 4.0000 mg | Freq: Four times a day (QID) | INTRAMUSCULAR | Status: DC | PRN

## 2013-12-12 MED ORDER — SODIUM CHLORIDE 0.9 % IJ SOLN: 10.0000 mL | INTRAMUSCULAR | Status: DC | PRN

## 2013-12-12 MED ORDER — DIPHENHYDRAMINE HCL 12.5 MG/5ML PO ELIX: 12.5000 mg | ORAL_SOLUTION | Freq: Four times a day (QID) | ORAL | Status: DC | PRN

## 2013-12-12 MED ORDER — ONDANSETRON HCL 4 MG PO TABS: 4.0000 mg | ORAL_TABLET | ORAL | Status: DC | PRN

## 2013-12-12 NOTE — Telephone Encounter (Signed)
Patient advised that Dr. Jonelle Sidle will see him today.

## 2013-12-12 NOTE — Telephone Encounter (Signed)
Patient complains of leg and thigh pain currently rating at 7. No other complaints. Informed patient that MD would be notified.

## 2013-12-12 NOTE — Discharge Summary (Signed)
Physician Discharge Summary  Patient ID: [name] MRN: [id number] DOB/AGE: [birth year] 33 y.o.  Admit date: 12/12/2013 Discharge date: 12/12/2013  Admission Diagnoses:  Discharge Diagnoses:  Active Problems:   Acute sickle cell crisis   Sickle cell anemia with crisis   Dehydration   Discharged Condition: good  Hospital Course: patient was admitted to the day hospital with sickle cell painful crisis. He was treated with IV Dilaudid, Toradol, and his oral medications. He has been noncompliant with his medications as well as follow-up at the clinic. We discussed at length the fact that he needs to do that if he wants to continue to get any better. At the end of his stay he was doing much better his pain level is down to 3 out of 10. He is to come back to the clinic for regular follow-up and get his regular medications.  Consults: None  Significant Diagnostic Studies: labs: CBC and CMP was checked. He is holding his regular limits.  Treatments: IV hydration and analgesia: Dilaudid  Discharge Exam: Blood pressure 118/79, pulse 93, temperature 98.3 F (36.8 C), temperature source Oral, resp. rate 23, height 6' (1.829 m), weight 73.936 kg (163 lb), SpO2 93 %. General appearance: alert, cooperative and no distress Eyes: conjunctivae/corneas clear. PERRL, EOM's intact. Fundi benign. Ears: normal TM's and external ear canals both ears Neck: no adenopathy, no carotid bruit, no JVD, supple, symmetrical, trachea midline and thyroid not enlarged, symmetric, no tenderness/mass/nodules Back: symmetric, no curvature. ROM normal. No CVA tenderness. Resp: clear to auscultation bilaterally Chest wall: no tenderness Cardio: regular rate and rhythm, S1, S2 normal, no murmur, click, rub or gallop GI: soft, non-tender; bowel sounds normal; no masses,  no organomegaly Extremities: extremities normal, atraumatic, no cyanosis or edema Pulses: 2+ and symmetric Skin: Skin color, texture, turgor  normal. No rashes or lesions Neurologic: Grossly normal  Disposition: 01-Home or Self Care     Medication List    TAKE these medications        folic acid 1 MG tablet  Commonly known as:  FOLVITE  Take 1 tablet (1 mg total) by mouth every morning.     HYDROmorphone 4 MG tablet  Commonly known as:  DILAUDID  Take 1 tablet (4 mg total) by mouth every 4 (four) hours as needed for severe pain.     hydroxyurea 500 MG capsule  Commonly known as:  HYDREA  Take 3 capsules (1,500 mg total) by mouth daily. May take with food to minimize GI side effects.     levofloxacin 750 MG tablet  Commonly known as:  LEVAQUIN  Take 1 tablet (750 mg total) by mouth daily.     lisinopril 10 MG tablet  Commonly known as:  PRINIVIL,ZESTRIL  Take 1 tablet (10 mg total) by mouth daily.     metoprolol tartrate 25 MG tablet  Commonly known as:  LOPRESSOR  Take 1 tablet (25 mg total) by mouth daily.     morphine 30 MG 12 hr tablet  Commonly known as:  MS CONTIN  Take 1 tablet (30 mg total) by mouth every 12 (twelve) hours.     potassium chloride SA 20 MEQ tablet  Commonly known as:  K-DUR,KLOR-CON  Take 1 tablet (20 mEq total) by mouth every morning.     rivaroxaban 20 MG Tabs tablet  Commonly known as:  XARELTO  Take 1 tablet (20 mg total) by mouth every morning.     zolpidem 10 MG tablet  Commonly known as:  AMBIEN  Take 1 tablet (10 mg total) by mouth at bedtime as needed for sleep.         SignedBarbette Merino 12/12/2013, 2:22 PM

## 2013-12-12 NOTE — Progress Notes (Signed)
Prescription re-written for MS Contin 30 mg #60 tabs.

## 2013-12-12 NOTE — Progress Notes (Signed)
Patient rating pain 4 out of 10- goal achieved. Patient port flushed and de acessed. Patient educated on prescription refill and the importance of regularly seeing physician.Discharge instructions given to patient.

## 2013-12-12 NOTE — H&P (Signed)
[name] is an 33 y.o. male.   Chief Complaint: Pain in legs and back HPI: Patient is a 33 yo man with known sickle cell disease here with Sickle cell painful crisis. Symptoms have ben going on for 3 days and he took his Dilaudid at home but not working. He has been non-compliant with his medications and other medical visists. He apparently has not taken his Xarelto and is out of his MS contin. No fever, no cough, no SOB. Pain is 7/10 in his legs and back. Worsened by movement and not relieved by anything. It is sharp, aching as his usal sickle cell pain.  Past Medical History  Diagnosis Date  . Sickle cell anemia   . Blood transfusion   . 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 Hypokalemia   . Mood disorder   . History of pulmonary embolus (PE)   . Avascular necrosis   . Leukocytosis     Chronic  . Thrombocytosis     Chronic  . Hypertension   . History of Clostridium difficile infection   . Uses marijuana   . Chronic anticoagulation   . Functional asplenia   . Former smoker   . Second hand tobacco smoke exposure   . Alcohol consumption of one to four drinks per day     Past Surgical History  Procedure Laterality Date  . Right hip replacement      08/2006  . Cholecystectomy      01/2008  . Porta cath placement    . Porta cath removal    . Umbilical hernia repair      01/2008  . Excision of left periauricular cyst      10/2009  . Excision of right ear lobe cyst with primary closur      11/2007  . Portacath placement  01/05/2012    Procedure: INSERTION PORT-A-CATH;  Surgeon: Adolph Pollack, MD;  Location: [facility] OR;  Service: General;  Laterality: N/A;  ultrasound guiced port a cath insertion with fluoroscopy    Family History  Problem Relation Age of Onset  . Sickle cell anemia Mother   . Sickle cell anemia Father   . Sickle cell trait Brother    Social History:  reports that he quit smoking about 3 years ago. He has never used smokeless  tobacco. He reports that he uses illicit drugs (Marijuana) about twice per week. He reports that he does not drink alcohol.  Allergies:  Allergies  Allergen Reactions  . Morphine And Related Hives and Rash    Pt states he can take PO, but not IV and he is able to tolerate Dilaudid with no reactions.    Medications Prior to Admission  Medication Sig Dispense Refill  . folic acid (FOLVITE) 1 MG tablet Take 1 tablet (1 mg total) by mouth every morning. 30 tablet 11  . HYDROmorphone (DILAUDID) 4 MG tablet Take 1 tablet (4 mg total) by mouth every 4 (four) hours as needed for severe pain. 90 tablet 0  . hydroxyurea (HYDREA) 500 MG capsule Take 3 capsules (1,500 mg total) by mouth daily. May take with food to minimize GI side effects. 60 capsule 3  . lisinopril (PRINIVIL,ZESTRIL) 10 MG tablet Take 1 tablet (10 mg total) by mouth daily. 30 tablet 0  . metoprolol tartrate (LOPRESSOR) 25 MG tablet Take 1 tablet (25 mg total) by mouth daily. 30 tablet 6  . potassium chloride SA (K-DUR,KLOR-CON) 20 MEQ tablet Take 1  tablet (20 mEq total) by mouth every morning. 30 tablet 3  . rivaroxaban (XARELTO) 20 MG TABS tablet Take 1 tablet (20 mg total) by mouth every morning. 30 tablet 0  . levofloxacin (LEVAQUIN) 750 MG tablet Take 1 tablet (750 mg total) by mouth daily. 5 tablet 0  . morphine (MS CONTIN) 30 MG 12 hr tablet Take 1 tablet (30 mg total) by mouth every 12 (twelve) hours. 60 tablet 0  . zolpidem (AMBIEN) 10 MG tablet Take 1 tablet (10 mg total) by mouth at bedtime as needed for sleep. 30 tablet 3    Results for orders placed or performed during the hospital encounter of 12/12/13 (from the past 48 hour(s))  CBC with Differential     Status: Abnormal   Collection Time: 12/12/13 10:00 AM  Result Value Ref Range   WBC 15.0 (H) 4.0 - 10.5 K/uL    Comment: WHITE COUNT CONFIRMED ON SMEAR RARE NRBCs    RBC 2.20 (L) 4.22 - 5.81 MIL/uL   Hemoglobin 7.4 (L) 13.0 - 17.0 g/dL   HCT 20.8 (L) 39.0 - 52.0  %   MCV 94.5 78.0 - 100.0 fL   MCH 33.6 26.0 - 34.0 pg   MCHC 35.6 30.0 - 36.0 g/dL   RDW 19.8 (H) 11.5 - 15.5 %   Platelets 431 (H) 150 - 400 K/uL   Neutrophils Relative % 76 43 - 77 %   Lymphocytes Relative 10 (L) 12 - 46 %   Monocytes Relative 11 3 - 12 %   Eosinophils Relative 3 0 - 5 %   Basophils Relative 0 0 - 1 %   nRBC 1 (H) 0 /100 WBC   Neutro Abs 11.3 (H) 1.7 - 7.7 K/uL   Lymphs Abs 1.5 0.7 - 4.0 K/uL   Monocytes Absolute 1.7 (H) 0.1 - 1.0 K/uL   Eosinophils Absolute 0.5 0.0 - 0.7 K/uL   Basophils Absolute 0.0 0.0 - 0.1 K/uL   RBC Morphology POLYCHROMASIA PRESENT     Comment: TARGET CELLS HOWELL/JOLLY BODIES SICKLE CELLS   Reticulocytes     Status: Abnormal   Collection Time: 12/12/13 10:00 AM  Result Value Ref Range   Retic Ct Pct 11.7 (H) 0.4 - 3.1 %   RBC. 2.20 (L) 4.22 - 5.81 MIL/uL   Retic Count, Manual 257.4 (H) 19.0 - 186.0 K/uL  Comprehensive metabolic panel     Status: Abnormal   Collection Time: 12/12/13 10:00 AM  Result Value Ref Range   Sodium 137 137 - 147 mEq/L   Potassium 3.7 3.7 - 5.3 mEq/L   Chloride 104 96 - 112 mEq/L   CO2 22 19 - 32 mEq/L   Glucose, Bld 98 70 - 99 mg/dL   BUN 10 6 - 23 mg/dL   Creatinine, Ser 0.57 0.50 - 1.35 mg/dL   Calcium 9.2 8.4 - 10.5 mg/dL   Total Protein 7.5 6.0 - 8.3 g/dL   Albumin 4.0 3.5 - 5.2 g/dL   AST 35 0 - 37 U/L   ALT 18 0 - 53 U/L   Alkaline Phosphatase 77 39 - 117 U/L   Total Bilirubin 7.0 (H) 0.3 - 1.2 mg/dL   GFR calc non Af Amer >90 >90 mL/min   GFR calc Af Amer >90 >90 mL/min    Comment: (NOTE) The eGFR has been calculated using the CKD EPI equation. This calculation has not been validated in all clinical situations. eGFR's persistently <90 mL/min signify possible Chronic Kidney Disease.  Anion gap 11 5 - 15  Lactate dehydrogenase     Status: Abnormal   Collection Time: 12/12/13 10:00 AM  Result Value Ref Range   LDH 526 (H) 94 - 250 U/L    Comment: HEMOLYSIS AT THIS LEVEL MAY AFFECT  RESULT   No results found.  Review of Systems  Constitutional: Negative.   Eyes: Negative.   Respiratory: Negative.   Cardiovascular: Negative.   Gastrointestinal: Negative.   Genitourinary: Negative.   Musculoskeletal: Positive for myalgias, back pain, joint pain and neck pain.  Skin: Negative.   Neurological: Positive for headaches.  Endo/Heme/Allergies: Negative.     Blood pressure 113/72, pulse 76, temperature 97.7 F (36.5 C), temperature source Oral, resp. rate 20, height 6' (1.829 m), weight 73.936 kg (163 lb), SpO2 92 %. Physical Exam  Constitutional: He is oriented to person, place, and time. He appears well-developed and well-nourished.  HENT:  Head: Normocephalic and atraumatic.  Right Ear: External ear normal.  Left Ear: External ear normal.  Eyes: Conjunctivae and EOM are normal. Pupils are equal, round, and reactive to light.  Neck: Normal range of motion. Neck supple.  Cardiovascular: Normal rate, regular rhythm, normal heart sounds and intact distal pulses.   Respiratory: Effort normal and breath sounds normal.  GI: Soft. Bowel sounds are normal.  Musculoskeletal: Normal range of motion.  Neurological: He is alert and oriented to person, place, and time. He has normal reflexes.  Skin: Skin is warm and dry.     Assessment/Plan A 33 yo man here with Sickle cell painful crisis.  #1 Sickle Cell Painful crisis: Patient will be admitted to the sickle cell day Hospital and start dilaudid PCA, Toradol, hydration and his oral medications. He will be counseled on home medications use and compliance with his Physician visits. Will also counsel on substance use.  #2 Sickle cell anemia: H&H seems close to baseline. Monitor  # 3 Dehydration: Will hydrate  #4 Chronic anticoagulation: He has not taken his Xarelto for 2 days now. He has been asked to resume it right now.   Kumiko Fishman,[name] 12/12/2013, 12:00 PM

## 2013-12-12 NOTE — Telephone Encounter (Signed)
Encounter opened in error

## 2013-12-22 ENCOUNTER — Emergency Department (HOSPITAL_COMMUNITY)
Admission: EM | Admit: 2013-12-22 | Discharge: 2013-12-22 | Disposition: A | Discharge status: Home / Self Care | Payer: Medicare Other | Source: Home / Self Care | Attending: Emergency Medicine | Admitting: Emergency Medicine

## 2013-12-22 ENCOUNTER — Encounter (HOSPITAL_COMMUNITY): Payer: Self-pay | Admitting: Emergency Medicine

## 2013-12-22 ENCOUNTER — Emergency Department (HOSPITAL_COMMUNITY): Payer: Medicare Other

## 2013-12-22 DIAGNOSIS — Z9049 Acquired absence of other specified parts of digestive tract: Secondary | ICD-10-CM | POA: Diagnosis not present

## 2013-12-22 DIAGNOSIS — J9601 Acute respiratory failure with hypoxia: Secondary | ICD-10-CM | POA: Diagnosis not present

## 2013-12-22 DIAGNOSIS — F39 Unspecified mood [affective] disorder: Secondary | ICD-10-CM | POA: Diagnosis not present

## 2013-12-22 DIAGNOSIS — Z886 Allergy status to analgesic agent status: Secondary | ICD-10-CM | POA: Diagnosis not present

## 2013-12-22 DIAGNOSIS — I10 Essential (primary) hypertension: Secondary | ICD-10-CM

## 2013-12-22 DIAGNOSIS — E876 Hypokalemia: Secondary | ICD-10-CM | POA: Insufficient documentation

## 2013-12-22 DIAGNOSIS — I2699 Other pulmonary embolism without acute cor pulmonale: Secondary | ICD-10-CM | POA: Diagnosis not present

## 2013-12-22 DIAGNOSIS — J40 Bronchitis, not specified as acute or chronic: Secondary | ICD-10-CM | POA: Diagnosis not present

## 2013-12-22 DIAGNOSIS — Z87891 Personal history of nicotine dependence: Secondary | ICD-10-CM | POA: Diagnosis not present

## 2013-12-22 DIAGNOSIS — D571 Sickle-cell disease without crisis: Secondary | ICD-10-CM | POA: Diagnosis not present

## 2013-12-22 DIAGNOSIS — D5700 Hb-SS disease with crisis, unspecified: Secondary | ICD-10-CM | POA: Diagnosis not present

## 2013-12-22 DIAGNOSIS — D72829 Elevated white blood cell count, unspecified: Secondary | ICD-10-CM | POA: Diagnosis not present

## 2013-12-22 DIAGNOSIS — M549 Dorsalgia, unspecified: Secondary | ICD-10-CM | POA: Diagnosis present

## 2013-12-22 DIAGNOSIS — Z9119 Patient's noncompliance with other medical treatment and regimen: Secondary | ICD-10-CM | POA: Diagnosis present

## 2013-12-22 DIAGNOSIS — Z96641 Presence of right artificial hip joint: Secondary | ICD-10-CM | POA: Diagnosis present

## 2013-12-22 DIAGNOSIS — G8929 Other chronic pain: Secondary | ICD-10-CM | POA: Diagnosis present

## 2013-12-22 DIAGNOSIS — R079 Chest pain, unspecified: Secondary | ICD-10-CM

## 2013-12-22 DIAGNOSIS — Z7901 Long term (current) use of anticoagulants: Secondary | ICD-10-CM | POA: Insufficient documentation

## 2013-12-22 DIAGNOSIS — Z87738 Personal history of other specified (corrected) congenital malformations of digestive system: Secondary | ICD-10-CM

## 2013-12-22 DIAGNOSIS — Z8659 Personal history of other mental and behavioral disorders: Secondary | ICD-10-CM

## 2013-12-22 DIAGNOSIS — Z86711 Personal history of pulmonary embolism: Secondary | ICD-10-CM

## 2013-12-22 DIAGNOSIS — R0902 Hypoxemia: Secondary | ICD-10-CM | POA: Diagnosis not present

## 2013-12-22 DIAGNOSIS — Z9114 Patient's other noncompliance with medication regimen: Secondary | ICD-10-CM | POA: Diagnosis present

## 2013-12-22 DIAGNOSIS — Z79899 Other long term (current) drug therapy: Secondary | ICD-10-CM

## 2013-12-22 DIAGNOSIS — J984 Other disorders of lung: Secondary | ICD-10-CM | POA: Diagnosis not present

## 2013-12-22 DIAGNOSIS — J9811 Atelectasis: Secondary | ICD-10-CM | POA: Diagnosis not present

## 2013-12-22 DIAGNOSIS — R109 Unspecified abdominal pain: Secondary | ICD-10-CM | POA: Diagnosis not present

## 2013-12-22 DIAGNOSIS — Z8739 Personal history of other diseases of the musculoskeletal system and connective tissue: Secondary | ICD-10-CM

## 2013-12-22 DIAGNOSIS — D473 Essential (hemorrhagic) thrombocythemia: Secondary | ICD-10-CM

## 2013-12-22 DIAGNOSIS — G4700 Insomnia, unspecified: Secondary | ICD-10-CM | POA: Diagnosis present

## 2013-12-22 DIAGNOSIS — F1290 Cannabis use, unspecified, uncomplicated: Secondary | ICD-10-CM | POA: Diagnosis not present

## 2013-12-22 LAB — CBC WITH DIFFERENTIAL/PLATELET
BASOS PCT: 1 % (ref 0–1)
Basophils Absolute: 0.1 10*3/uL (ref 0.0–0.1)
EOS ABS: 0.3 10*3/uL (ref 0.0–0.7)
Eosinophils Relative: 2 % (ref 0–5)
HEMATOCRIT: 19 % — AB (ref 39.0–52.0)
Hemoglobin: 6.8 g/dL — CL (ref 13.0–17.0)
LYMPHS PCT: 18 % (ref 12–46)
Lymphs Abs: 2.6 10*3/uL (ref 0.7–4.0)
MCH: 34.5 pg — ABNORMAL HIGH (ref 26.0–34.0)
MCHC: 35.8 g/dL (ref 30.0–36.0)
MCV: 96.4 fL (ref 78.0–100.0)
Monocytes Absolute: 1.5 10*3/uL — ABNORMAL HIGH (ref 0.1–1.0)
Monocytes Relative: 10 % (ref 3–12)
NEUTROS ABS: 10.1 10*3/uL — AB (ref 1.7–7.7)
Neutrophils Relative %: 69 % (ref 43–77)
Platelets: 443 10*3/uL — ABNORMAL HIGH (ref 150–400)
RBC: 1.97 MIL/uL — ABNORMAL LOW (ref 4.22–5.81)
RDW: 22.4 % — ABNORMAL HIGH (ref 11.5–15.5)
WBC: 14.6 10*3/uL — ABNORMAL HIGH (ref 4.0–10.5)

## 2013-12-22 LAB — COMPREHENSIVE METABOLIC PANEL
ALK PHOS: 75 U/L (ref 39–117)
ALT: 19 U/L (ref 0–53)
AST: 35 U/L (ref 0–37)
Albumin: 4.2 g/dL (ref 3.5–5.2)
Anion gap: 16 — ABNORMAL HIGH (ref 5–15)
BILIRUBIN TOTAL: 6.6 mg/dL — AB (ref 0.3–1.2)
BUN: 6 mg/dL (ref 6–23)
CHLORIDE: 108 meq/L (ref 96–112)
CO2: 20 meq/L (ref 19–32)
Calcium: 8.9 mg/dL (ref 8.4–10.5)
Creatinine, Ser: 0.67 mg/dL (ref 0.50–1.35)
GLUCOSE: 122 mg/dL — AB (ref 70–99)
Potassium: 4 mEq/L (ref 3.7–5.3)
SODIUM: 144 meq/L (ref 137–147)
Total Protein: 7.7 g/dL (ref 6.0–8.3)

## 2013-12-22 LAB — RETICULOCYTES
RBC.: 1.97 MIL/uL — AB (ref 4.22–5.81)
RETIC COUNT ABSOLUTE: 293.5 10*3/uL — AB (ref 19.0–186.0)
Retic Ct Pct: 14.9 % — ABNORMAL HIGH (ref 0.4–3.1)

## 2013-12-22 MED ORDER — SODIUM CHLORIDE 0.9 % IV SOLN
Freq: Once | INTRAVENOUS | Status: AC
  Administered 2013-12-22: 12:00:00 via INTRAVENOUS

## 2013-12-22 MED ORDER — SODIUM CHLORIDE 0.9 % IV BOLUS (SEPSIS)
1000.0000 mL | Freq: Once | INTRAVENOUS | Status: AC
  Administered 2013-12-22: 1000 mL via INTRAVENOUS

## 2013-12-22 MED ORDER — HYDROMORPHONE HCL 2 MG/ML IJ SOLN
2.0000 mg | INTRAMUSCULAR | Status: AC | PRN
  Administered 2013-12-22 (×3): 2 mg via INTRAVENOUS
  Filled 2013-12-22 (×3): qty 1

## 2013-12-22 MED ORDER — HEPARIN SOD (PORK) LOCK FLUSH 100 UNIT/ML IV SOLN
500.0000 [IU] | Freq: Once | INTRAVENOUS | Status: AC
  Administered 2013-12-22: 500 [IU]
  Filled 2013-12-22: qty 5

## 2013-12-22 MED ORDER — DIPHENHYDRAMINE HCL 50 MG/ML IJ SOLN
12.5000 mg | Freq: Four times a day (QID) | INTRAMUSCULAR | Status: DC | PRN
  Administered 2013-12-22: 12.5 mg via INTRAVENOUS
  Filled 2013-12-22 (×2): qty 1

## 2013-12-22 MED ORDER — ONDANSETRON HCL 4 MG/2ML IJ SOLN
4.0000 mg | Freq: Once | INTRAMUSCULAR | Status: AC
  Administered 2013-12-22: 4 mg via INTRAVENOUS
  Filled 2013-12-22: qty 2

## 2013-12-22 NOTE — ED Notes (Addendum)
Pt requested cheese and crackers which was provided

## 2013-12-22 NOTE — ED Notes (Signed)
Provided pt with a ginger ale, crackers and cheese

## 2013-12-22 NOTE — ED Provider Notes (Signed)
CSN: [id number]     Arrival date & time 12/22/13  1057 History   First MD Initiated Contact with Patient 12/22/13 1109     Chief Complaint  Patient presents with  . Sickle Cell Pain Crisis     (Consider location/radiation/quality/duration/timing/severity/associated sxs/prior Treatment) HPI [name] is a 33 y.o. male with history of sickle cell disease, states that he is here for pain in his bilateral ribs and lower legs. States pain began early this morning. Feels like prior sickle cell crisis. Denies any shortness of breath, no cough, no fever. No pain in upper extremities or back. He did not take any medications this morning, states he ran out of pain medicines yesterday. Primary care doctor is Dr. Zigmund Daniel. Last hospitalization 1 month ago.    Past Medical History  Diagnosis Date  . Sickle cell anemia   . Blood transfusion   . Acute embolism and thrombosis of right internal jugular vein   . Hypokalemia   . Mood disorder   . History of pulmonary embolus (PE)   . Avascular necrosis   . Leukocytosis     Chronic  . Thrombocytosis     Chronic  . Hypertension   . History of Clostridium difficile infection   . Uses marijuana   . Chronic anticoagulation   . Functional asplenia   . Former smoker   . Second hand tobacco smoke exposure   . Alcohol consumption of one to four drinks per day    Past Surgical History  Procedure Laterality Date  . Right hip replacement      08/2006  . Cholecystectomy      01/2008  . Porta cath placement    . Porta cath removal    . Umbilical hernia repair      01/2008  . Excision of left periauricular cyst      10/2009  . Excision of right ear lobe cyst with primary closur      11/2007  . Portacath placement  01/05/2012    Procedure: INSERTION PORT-A-CATH;  Surgeon: Odis Hollingshead, MD;  Location: [facility];  Service: General;  Laterality: N/A;  ultrasound guiced port a cath insertion with fluoroscopy   Family History  Problem Relation  Age of Onset  . Sickle cell anemia Mother   . Sickle cell anemia Father   . Sickle cell trait Brother    History  Substance Use Topics  . Smoking status: Former Smoker -- 13 years    Quit date: 07/08/2010  . Smokeless tobacco: Never Used  . Alcohol Use: No    Review of Systems  Constitutional: Negative for fever and chills.  Respiratory: Negative for cough, chest tightness and shortness of breath.   Cardiovascular: Positive for chest pain. Negative for palpitations and leg swelling.  Gastrointestinal: Negative for nausea, vomiting, abdominal pain, diarrhea and abdominal distention.  Genitourinary: Negative for dysuria, urgency, frequency and hematuria.  Musculoskeletal: Positive for myalgias and arthralgias. Negative for neck pain and neck stiffness.  Skin: Negative for rash.  Neurological: Negative for dizziness, weakness, light-headedness, numbness and headaches.  All other systems reviewed and are negative.     Allergies  Morphine and related  Home Medications   Prior to Admission medications   Medication Sig Start Date End Date Taking? Authorizing Provider  folic acid (FOLVITE) 1 MG tablet Take 1 tablet (1 mg total) by mouth every morning. 10/08/13  Yes [name], MD  HYDROmorphone (DILAUDID) 4 MG tablet Take 1 tablet (4 mg total)  by mouth every 4 (four) hours as needed for severe pain. 11/28/13  Yes [name], MD  hydroxyurea (HYDREA) 500 MG capsule Take 3 capsules (1,500 mg total) by mouth daily. May take with food to minimize GI side effects. 10/30/13  Yes [name], MD  lisinopril (PRINIVIL,ZESTRIL) 10 MG tablet Take 1 tablet (10 mg total) by mouth daily. 11/19/13  Yes [name], MD  metoprolol tartrate (LOPRESSOR) 25 MG tablet Take 1 tablet (25 mg total) by mouth daily. 08/22/13  Yes [name], MD  morphine (MS CONTIN) 30 MG 12 hr tablet Take 1 tablet (30 mg total) by mouth every 12 (twelve) hours. 12/12/13  Yes [name], MD  potassium chloride SA (K-DUR,KLOR-CON) 20 MEQ tablet Take 1 tablet (20 mEq total) by mouth every morning. 09/25/13  Yes [name], MD  rivaroxaban (XARELTO) 20 MG TABS tablet Take 1 tablet (20 mg total) by mouth every morning. 10/24/13  Yes Dorena Dew, FNP  zolpidem (AMBIEN) 10 MG tablet Take 1 tablet (10 mg total) by mouth at bedtime as needed for sleep. 10/28/13  Yes Dorena Dew, FNP  levofloxacin (LEVAQUIN) 750 MG tablet Take 1 tablet (750 mg total) by mouth daily. Patient not taking: Reported on 12/22/2013 11/28/13   Elwyn Reach, MD   BP 112/78 mmHg  Pulse 87  Temp(Src) 97.8 F (36.6 C) (Oral)  Resp 20  SpO2 97% Physical Exam  Constitutional: He is oriented to person, place, and time. He appears well-developed and well-nourished. No distress.  HENT:  Head: Normocephalic and atraumatic.  Eyes: Conjunctivae are normal.  Neck: Neck supple.  Cardiovascular: Normal rate, regular rhythm and normal heart sounds.   Pulmonary/Chest: Effort normal. No respiratory distress. He has no wheezes. He has no rales.  Abdominal: Soft. Bowel sounds are normal. He exhibits no distension. There is no tenderness. There is no rebound.  Musculoskeletal: He exhibits no edema.  Neurological: He is alert and oriented to person, place, and time. No cranial nerve deficit. Coordination normal.  Skin: Skin is warm and dry.  Nursing note and vitals reviewed.   ED Course  Procedures (including critical care time) Labs Review Labs Reviewed  CBC WITH DIFFERENTIAL - Abnormal; Notable for the following:    WBC 14.6 (*)    RBC 1.97 (*)    Hemoglobin 6.8 (*)    HCT 19.0 (*)    MCH 34.5 (*)    RDW 22.4 (*)    Platelets 443 (*)    Neutro Abs 10.1 (*)    Monocytes Absolute 1.5 (*)    All other components within normal limits  COMPREHENSIVE METABOLIC PANEL - Abnormal; Notable for the following:    Glucose, Bld 122 (*)    Total Bilirubin 6.6 (*)    Anion gap 16 (*)    All  other components within normal limits  RETICULOCYTES - Abnormal; Notable for the following:    Retic Ct Pct 14.9 (*)    RBC. 1.97 (*)    Retic Count, Manual 293.5 (*)    All other components within normal limits    Imaging Review Dg Chest 2 View  12/22/2013   CLINICAL DATA:  Fever, weakness and body aches. History of sickle cell anemia.  EXAM: CHEST - 2 VIEW  COMPARISON:  10/30/2013  FINDINGS: Stable appearance of left-sided port with catheter tip at the cavoatrial junction. Stable appearance of pulmonary scarring bilaterally. There is no evidence of pulmonary edema, consolidation, pneumothorax, nodule  or pleural fluid. The heart size is stable and at the upper limits of normal. The visualized bony thorax is unremarkable.  IMPRESSION: No active disease.  Stable pulmonary scarring.   Electronically Signed   By: Aletta Edouard M.D.   On: 12/22/2013 12:27     EKG Interpretation   Date/Time:  Sunday December 22 2013 13:09:39 EST Ventricular Rate:  78 PR Interval:  184 QRS Duration: 110 QT Interval:  407 QTC Calculation: 464 R Axis:   -63 Text Interpretation:  Sinus rhythm Probable left atrial enlargement LAD,  consider left anterior fascicular block RSR' in V1 or V2, probably normal  variant Abnrm T, consider ischemia, anterolateral lds Baseline wander in  lead(s) V3 Confirmed by NANAVATI, MD, Thelma Comp [phone]) on 12/22/2013 3:38:47  PM      MDM   Final diagnoses:  Chest pain  Hb-SS disease with crisis    Patient is here with bilateral rib pain, lower leg pain, onset early this morning. Feels like prior sickle cell crisis. Lab work, IV fluids ordered. Patient placed on oxygen. Will order Dilaudid for pain, 3 doses ordered. Will monitor.  3:31 PM Pain much improved after 3 doses. Stable for d/c home. He will follow up with his PCP tomorrow. Today  No evidence of acute chest. hgb low but at base line. Non toxic appearing.   Filed Vitals:   12/22/13 1437 12/22/13 1439 12/22/13 1453  12/22/13 1505  BP: 124/81     Pulse: 95     Temp: 98.2 F (36.8 C)     TempSrc: Oral     Resp: 14     SpO2: 85% 91% 91% 92%     Renold Genta, PA-C 12/22/13 [place] Jacklin Zwick, PA-C 12/22/13 1547  Varney Biles, MD 12/22/13 1653

## 2013-12-22 NOTE — Discharge Instructions (Signed)
Fill and continue to take your pain medications. Follow up with Dr. Zigmund Daniel tomorrow.   Sickle Cell Anemia, Adult Sickle cell anemia is a condition in which red blood cells have an abnormal "sickle" shape. This abnormal shape shortens the cells' life span, which results in a lower than normal concentration of red blood cells in the blood. The sickle shape also causes the cells to clump together and block free blood flow through the blood vessels. As a result, the tissues and organs of the body do not receive enough oxygen. Sickle cell anemia causes organ damage and pain and increases the risk of infection. CAUSES  Sickle cell anemia is a genetic disorder. Those who receive two copies of the gene have the condition, and those who receive one copy have the trait. RISK FACTORS The sickle cell gene is most common in people whose families originated in [place]. Other areas of the globe where sickle cell trait occurs include the [place], [place] and [place], and the [place].  SIGNS AND SYMPTOMS  Pain, especially in the extremities, back, chest, or abdomen (common). The pain may start suddenly or may develop following an illness, especially if there is dehydration. Pain can also occur due to overexertion or exposure to extreme temperature changes.  Frequent severe bacterial infections, especially certain types of pneumonia and meningitis.  Pain and swelling in the hands and feet.  Decreased activity.   Loss of appetite.   Change in behavior.  Headaches.  Seizures.  Shortness of breath or difficulty breathing.  Vision changes.  Skin ulcers. Those with the trait may not have symptoms or they may have mild symptoms.  DIAGNOSIS  Sickle cell anemia is diagnosed with blood tests that demonstrate the genetic trait. It is often diagnosed during the newborn period, due to mandatory testing nationwide. A variety of blood tests, X-rays, CT scans, MRI scans,  ultrasounds, and lung function tests may also be done to monitor the condition. TREATMENT  Sickle cell anemia may be treated with:  Medicines. You may be given pain medicines, antibiotic medicines (to treat and prevent infections) or medicines to increase the production of certain types of hemoglobin.  Fluids.  Oxygen.  Blood transfusions. HOME CARE INSTRUCTIONS   Drink enough fluid to keep your urine clear or pale yellow. Increase your fluid intake in hot weather and during exercise.  Do not smoke. Smoking lowers oxygen levels in the blood.   Only take over-the-counter or prescription medicines for pain, fever, or discomfort as directed by your health care provider.  Take antibiotics as directed by your health care provider. Make sure you finish them it even if you start to feel better.   Take supplements as directed by your health care provider.   Consider wearing a medical alert bracelet. This tells anyone caring for you in an emergency of your condition.   When traveling, keep your medical information, health care provider's names, and the medicines you take with you at all times.   If you develop a fever, do not take medicines to reduce the fever right away. This could cover up a problem that is developing. Notify your health care provider.  Keep all follow-up appointments with your health care provider. Sickle cell anemia requires regular medical care. SEEK MEDICAL CARE IF: You have a fever. SEEK IMMEDIATE MEDICAL CARE IF:   You feel dizzy or faint.   You have new abdominal pain, especially on the left side near the stomach area.   You develop  a persistent, often uncomfortable and painful penile erection (priapism). If this is not treated immediately it will lead to impotence.   You have numbness your arms or legs or you have a hard time moving them.   You have a hard time with speech.   You have a fever or persistent symptoms for more than 2-3 days.    You have a fever and your symptoms suddenly get worse.   You have signs or symptoms of infection. These include:   Chills.   Abnormal tiredness (lethargy).   Irritability.   Poor eating.   Vomiting.   You develop pain that is not helped with medicine.   You develop shortness of breath.  You have pain in your chest.   You are coughing up pus-like or bloody sputum.   You develop a stiff neck.  Your feet or hands swell or have pain.  Your abdomen appears bloated.  You develop joint pain. MAKE SURE YOU:  Understand these instructions. Document Released: 04/27/2005 Document Revised: 06/03/2013 Document Reviewed: 08/29/2012 [facility] Patient Information 2015 [place], [place]. This information is not intended to replace advice given to you by your health care provider. Make sure you discuss any questions you have with your health care provider.

## 2013-12-22 NOTE — ED Notes (Signed)
Pt sts that he is having bilateral flank, lower back and bilateral leg pain that started this am. Pt denies SOB or CP. Pt is A&O and in NAD

## 2013-12-22 NOTE — ED Notes (Addendum)
Pt from home via [facility] c/o SCC that started 2.5 hours PTA. Pt has not had home meds today d/t nausea. Pt c/o bilateral flank pain and lower back pain, 9/10. Pt is A&O, ambulatory and in NAD

## 2013-12-22 NOTE — ED Notes (Signed)
Bed: WA06 Expected date: 12/22/13 Expected time: 10:48 AM Means of arrival:  Comments: Sickle Cell Crisis

## 2013-12-23 ENCOUNTER — Emergency Department (HOSPITAL_COMMUNITY): Payer: Medicare Other

## 2013-12-23 ENCOUNTER — Inpatient Hospital Stay (HOSPITAL_COMMUNITY)
Admission: EM | Admit: 2013-12-23 | Discharge: 2013-12-24 | DRG: 811 | Discharge status: Against Medical Advice | Payer: Medicare Other | Attending: Internal Medicine | Admitting: Internal Medicine

## 2013-12-23 ENCOUNTER — Encounter (HOSPITAL_COMMUNITY): Payer: Self-pay | Admitting: Emergency Medicine

## 2013-12-23 DIAGNOSIS — Z9114 Patient's other noncompliance with medication regimen: Secondary | ICD-10-CM | POA: Diagnosis not present

## 2013-12-23 DIAGNOSIS — J9811 Atelectasis: Secondary | ICD-10-CM | POA: Diagnosis not present

## 2013-12-23 DIAGNOSIS — I2699 Other pulmonary embolism without acute cor pulmonale: Secondary | ICD-10-CM | POA: Diagnosis not present

## 2013-12-23 DIAGNOSIS — F1290 Cannabis use, unspecified, uncomplicated: Secondary | ICD-10-CM | POA: Diagnosis not present

## 2013-12-23 DIAGNOSIS — Z86711 Personal history of pulmonary embolism: Secondary | ICD-10-CM | POA: Diagnosis not present

## 2013-12-23 DIAGNOSIS — Z87891 Personal history of nicotine dependence: Secondary | ICD-10-CM | POA: Diagnosis not present

## 2013-12-23 DIAGNOSIS — Z79899 Other long term (current) drug therapy: Secondary | ICD-10-CM | POA: Diagnosis not present

## 2013-12-23 DIAGNOSIS — I10 Essential (primary) hypertension: Secondary | ICD-10-CM | POA: Diagnosis not present

## 2013-12-23 DIAGNOSIS — IMO0002 Reserved for concepts with insufficient information to code with codable children: Secondary | ICD-10-CM | POA: Insufficient documentation

## 2013-12-23 DIAGNOSIS — R0902 Hypoxemia: Secondary | ICD-10-CM

## 2013-12-23 DIAGNOSIS — Z9049 Acquired absence of other specified parts of digestive tract: Secondary | ICD-10-CM | POA: Diagnosis not present

## 2013-12-23 DIAGNOSIS — R52 Pain, unspecified: Secondary | ICD-10-CM | POA: Diagnosis not present

## 2013-12-23 DIAGNOSIS — D5700 Hb-SS disease with crisis, unspecified: Secondary | ICD-10-CM | POA: Diagnosis not present

## 2013-12-23 DIAGNOSIS — G4700 Insomnia, unspecified: Secondary | ICD-10-CM | POA: Diagnosis not present

## 2013-12-23 DIAGNOSIS — M549 Dorsalgia, unspecified: Secondary | ICD-10-CM | POA: Diagnosis present

## 2013-12-23 DIAGNOSIS — Z9119 Patient's noncompliance with other medical treatment and regimen: Secondary | ICD-10-CM | POA: Diagnosis not present

## 2013-12-23 DIAGNOSIS — R0781 Pleurodynia: Secondary | ICD-10-CM | POA: Diagnosis not present

## 2013-12-23 DIAGNOSIS — Z96641 Presence of right artificial hip joint: Secondary | ICD-10-CM | POA: Diagnosis not present

## 2013-12-23 DIAGNOSIS — Z7901 Long term (current) use of anticoagulants: Secondary | ICD-10-CM | POA: Diagnosis not present

## 2013-12-23 DIAGNOSIS — D72829 Elevated white blood cell count, unspecified: Secondary | ICD-10-CM | POA: Diagnosis not present

## 2013-12-23 DIAGNOSIS — Z886 Allergy status to analgesic agent status: Secondary | ICD-10-CM | POA: Diagnosis not present

## 2013-12-23 DIAGNOSIS — G8929 Other chronic pain: Secondary | ICD-10-CM | POA: Diagnosis not present

## 2013-12-23 DIAGNOSIS — J9601 Acute respiratory failure with hypoxia: Secondary | ICD-10-CM | POA: Diagnosis not present

## 2013-12-23 DIAGNOSIS — F39 Unspecified mood [affective] disorder: Secondary | ICD-10-CM | POA: Diagnosis not present

## 2013-12-23 DIAGNOSIS — J40 Bronchitis, not specified as acute or chronic: Secondary | ICD-10-CM | POA: Diagnosis not present

## 2013-12-23 LAB — RETICULOCYTES
RBC.: 1.92 MIL/uL — ABNORMAL LOW (ref 4.22–5.81)
Retic Count, Absolute: 401.3 10*3/uL — ABNORMAL HIGH (ref 19.0–186.0)
Retic Ct Pct: 20.9 % — ABNORMAL HIGH (ref 0.4–3.1)

## 2013-12-23 LAB — COMPREHENSIVE METABOLIC PANEL
ALT: 20 U/L (ref 0–53)
ANION GAP: 13 (ref 5–15)
AST: 37 U/L (ref 0–37)
Albumin: 4.1 g/dL (ref 3.5–5.2)
Alkaline Phosphatase: 83 U/L (ref 39–117)
BUN: 8 mg/dL (ref 6–23)
CHLORIDE: 107 meq/L (ref 96–112)
CO2: 21 mEq/L (ref 19–32)
Calcium: 9 mg/dL (ref 8.4–10.5)
Creatinine, Ser: 0.61 mg/dL (ref 0.50–1.35)
GFR calc Af Amer: >90 mL/min (ref >90)
GFR calc non Af Amer: >90 mL/min (ref >90)
Glucose, Bld: 101 mg/dL — ABNORMAL HIGH (ref 70–99)
Potassium: 4.2 mEq/L (ref 3.7–5.3)
SODIUM: 141 meq/L (ref 137–147)
TOTAL PROTEIN: 7.4 g/dL (ref 6.0–8.3)
Total Bilirubin: 6.9 mg/dL — ABNORMAL HIGH (ref 0.3–1.2)

## 2013-12-23 LAB — CBC WITH DIFFERENTIAL/PLATELET
BASOS ABS: 0 10*3/uL (ref 0.0–0.1)
Basophils Relative: 0 % (ref 0–1)
EOS PCT: 1 % (ref 0–5)
Eosinophils Absolute: 0.2 10*3/uL (ref 0.0–0.7)
HEMATOCRIT: 18.4 % — AB (ref 39.0–52.0)
Hemoglobin: 6.6 g/dL — CL (ref 13.0–17.0)
LYMPHS PCT: 15 % (ref 12–46)
Lymphs Abs: 3.1 10*3/uL (ref 0.7–4.0)
MCH: 34.4 pg — ABNORMAL HIGH (ref 26.0–34.0)
MCHC: 35.9 g/dL (ref 30.0–36.0)
MCV: 95.8 fL (ref 78.0–100.0)
Monocytes Absolute: 2.3 10*3/uL — ABNORMAL HIGH (ref 0.1–1.0)
Monocytes Relative: 11 % (ref 3–12)
Neutro Abs: 15 10*3/uL — ABNORMAL HIGH (ref 1.7–7.7)
Neutrophils Relative %: 73 % (ref 43–77)
PLATELETS: 430 10*3/uL — AB (ref 150–400)
RBC: 1.92 MIL/uL — ABNORMAL LOW (ref 4.22–5.81)
RDW: 22.8 % — AB (ref 11.5–15.5)
WBC: 20.6 10*3/uL — ABNORMAL HIGH (ref 4.0–10.5)

## 2013-12-23 LAB — URINALYSIS, ROUTINE W REFLEX MICROSCOPIC
Bilirubin Urine: NEGATIVE
Glucose, UA: NEGATIVE mg/dL
Hgb urine dipstick: NEGATIVE
KETONES UR: NEGATIVE mg/dL
LEUKOCYTES UA: NEGATIVE
NITRITE: NEGATIVE
PH: 7 (ref 5.0–8.0)
Protein, ur: NEGATIVE mg/dL
SPECIFIC GRAVITY, URINE: 1.012 (ref 1.005–1.030)
Urobilinogen, UA: 2 mg/dL — ABNORMAL HIGH (ref 0.0–1.0)

## 2013-12-23 LAB — MAGNESIUM: MAGNESIUM: 1.8 mg/dL (ref 1.5–2.5)

## 2013-12-23 LAB — LACTATE DEHYDROGENASE: LDH: 638 U/L — AB (ref 94–250)

## 2013-12-23 MED ORDER — DIPHENHYDRAMINE HCL 12.5 MG/5ML PO ELIX: 12.5000 mg | ORAL_SOLUTION | Freq: Four times a day (QID) | ORAL | Status: DC | PRN

## 2013-12-23 MED ORDER — ZOLPIDEM TARTRATE 10 MG PO TABS
10.0000 mg | ORAL_TABLET | Freq: Every evening | ORAL | Status: DC | PRN
  Administered 2013-12-23: 10 mg via ORAL
  Filled 2013-12-23: qty 1

## 2013-12-23 MED ORDER — SODIUM CHLORIDE 0.9 % IJ SOLN: 9.0000 mL | INTRAMUSCULAR | Status: DC | PRN

## 2013-12-23 MED ORDER — NALOXONE HCL 0.4 MG/ML IJ SOLN: 0.4000 mg | INTRAMUSCULAR | Status: DC | PRN

## 2013-12-23 MED ORDER — HYDROMORPHONE 0.3 MG/ML IV SOLN
INTRAVENOUS | Status: DC
  Administered 2013-12-23: 16:00:00 via INTRAVENOUS
  Filled 2013-12-23: qty 25

## 2013-12-23 MED ORDER — SODIUM CHLORIDE 0.9 % IV SOLN: 12.5000 mg | Freq: Four times a day (QID) | INTRAVENOUS | Status: DC | PRN

## 2013-12-23 MED ORDER — FOLIC ACID 1 MG PO TABS: 1.0000 mg | ORAL_TABLET | Freq: Every day | ORAL | Status: DC

## 2013-12-23 MED ORDER — SODIUM CHLORIDE 0.9 % IV SOLN
12.5000 mg | Freq: Four times a day (QID) | INTRAVENOUS | Status: DC | PRN
  Filled 2013-12-23: qty 0.25

## 2013-12-23 MED ORDER — SENNOSIDES-DOCUSATE SODIUM 8.6-50 MG PO TABS
1.0000 | ORAL_TABLET | Freq: Two times a day (BID) | ORAL | Status: DC
Start: 2013-12-23 — End: 2013-12-24
  Administered 2013-12-23: 1 via ORAL
  Filled 2013-12-23 (×3): qty 1

## 2013-12-23 MED ORDER — ONDANSETRON HCL 4 MG/2ML IJ SOLN: 4.0000 mg | Freq: Four times a day (QID) | INTRAMUSCULAR | Status: DC | PRN

## 2013-12-23 MED ORDER — HYDROXYUREA 500 MG PO CAPS
1500.0000 mg | ORAL_CAPSULE | Freq: Every day | ORAL | Status: DC
  Administered 2013-12-24: 1500 mg via ORAL
  Filled 2013-12-23 (×2): qty 3

## 2013-12-23 MED ORDER — DEXTROSE-NACL 5-0.45 % IV SOLN
INTRAVENOUS | Status: DC
  Administered 2013-12-23 – 2013-12-24 (×2): via INTRAVENOUS

## 2013-12-23 MED ORDER — MORPHINE SULFATE ER 30 MG PO TBCR
30.0000 mg | EXTENDED_RELEASE_TABLET | Freq: Two times a day (BID) | ORAL | Status: DC
  Administered 2013-12-23 – 2013-12-24 (×2): 30 mg via ORAL
  Filled 2013-12-23 (×2): qty 1

## 2013-12-23 MED ORDER — FOLIC ACID 1 MG PO TABS
1.0000 mg | ORAL_TABLET | Freq: Every morning | ORAL | Status: DC
  Administered 2013-12-24: 1 mg via ORAL
  Filled 2013-12-23: qty 1

## 2013-12-23 MED ORDER — ONDANSETRON HCL 4 MG/2ML IJ SOLN: 4.0000 mg | INTRAMUSCULAR | Status: DC | PRN

## 2013-12-23 MED ORDER — DIPHENHYDRAMINE HCL 25 MG PO CAPS: 12.5000 mg | ORAL_CAPSULE | Freq: Four times a day (QID) | ORAL | Status: DC | PRN

## 2013-12-23 MED ORDER — POTASSIUM CHLORIDE CRYS ER 20 MEQ PO TBCR
20.0000 meq | EXTENDED_RELEASE_TABLET | Freq: Every morning | ORAL | Status: DC
Start: 2013-12-24 — End: 2013-12-24
  Administered 2013-12-24: 20 meq via ORAL
  Filled 2013-12-23: qty 1

## 2013-12-23 MED ORDER — POLYETHYLENE GLYCOL 3350 17 G PO PACK
17.0000 g | PACK | Freq: Every day | ORAL | Status: DC | PRN
  Filled 2013-12-23: qty 1

## 2013-12-23 MED ORDER — HYDROMORPHONE HCL 4 MG PO TABS
4.0000 mg | ORAL_TABLET | ORAL | Status: DC | PRN
  Administered 2013-12-23: 4 mg via ORAL
  Filled 2013-12-23: qty 1

## 2013-12-23 MED ORDER — DIPHENHYDRAMINE HCL 12.5 MG/5ML PO ELIX
12.5000 mg | ORAL_SOLUTION | Freq: Four times a day (QID) | ORAL | Status: DC | PRN
Start: 2013-12-23 — End: 2013-12-23
  Filled 2013-12-23: qty 5

## 2013-12-23 MED ORDER — KETOROLAC TROMETHAMINE 30 MG/ML IJ SOLN
30.0000 mg | Freq: Four times a day (QID) | INTRAMUSCULAR | Status: DC
  Administered 2013-12-23 – 2013-12-24 (×3): 30 mg via INTRAVENOUS
  Filled 2013-12-23 (×7): qty 1

## 2013-12-23 MED ORDER — HYDROMORPHONE 2 MG/ML HIGH CONCENTRATION IV PCA SOLN: INTRAVENOUS | Status: DC

## 2013-12-23 MED ORDER — ONDANSETRON HCL 4 MG PO TABS: 4.0000 mg | ORAL_TABLET | ORAL | Status: DC | PRN

## 2013-12-23 MED ORDER — RIVAROXABAN 20 MG PO TABS
20.0000 mg | ORAL_TABLET | Freq: Every day | ORAL | Status: DC
  Administered 2013-12-23: 20 mg via ORAL
  Filled 2013-12-23 (×3): qty 1

## 2013-12-23 MED ORDER — METOPROLOL TARTRATE 25 MG PO TABS
25.0000 mg | ORAL_TABLET | Freq: Every day | ORAL | Status: DC
  Administered 2013-12-24: 25 mg via ORAL
  Filled 2013-12-23: qty 1

## 2013-12-23 MED ORDER — LISINOPRIL 10 MG PO TABS
10.0000 mg | ORAL_TABLET | Freq: Every day | ORAL | Status: DC
  Administered 2013-12-24: 10 mg via ORAL
  Filled 2013-12-23: qty 1

## 2013-12-23 MED ORDER — HYDROMORPHONE HCL 1 MG/ML IJ SOLN
1.0000 mg | INTRAMUSCULAR | Status: AC | PRN
  Administered 2013-12-23 (×3): 1 mg via INTRAVENOUS
  Filled 2013-12-23 (×3): qty 1

## 2013-12-23 MED ORDER — HYDROMORPHONE 2 MG/ML HIGH CONCENTRATION IV PCA SOLN
INTRAVENOUS | Status: DC
  Administered 2013-12-23: 17:00:00 via INTRAVENOUS
  Filled 2013-12-23: qty 25

## 2013-12-23 NOTE — ED Provider Notes (Signed)
CSN: [id number]     Arrival date & time 12/23/13  1018 History   First MD Initiated Contact with Patient 12/23/13 1149     Chief Complaint  Patient presents with  . Sickle Cell Pain Crisis    full body pain x24 hrs, out of meds for a week.   (Consider location/radiation/quality/duration/timing/severity/associated sxs/prior Treatment) HPI [name] is a 33 yo male presenting with continued pain in his ribs and lower back and reports this as similar to his sickle cell pain.  He was seen yesterday and received 3 doses of pain meds and felt well enough to be discharged home.  Last night he tried to drink some orange juice and felt nauseated and vomited twice, and began to have a recurrence of his typical sickle cell pain.  He denies any new injury or falls.  He reports he normally takes PO morphine and dilaudid but has been out for the last week.  He denies fever, chest pain or shortness of breath.    Past Medical History  Diagnosis Date  . Sickle cell anemia   . Blood transfusion   . Acute embolism and thrombosis of right internal jugular vein   . Hypokalemia   . Mood disorder   . History of pulmonary embolus (PE)   . Avascular necrosis   . Leukocytosis     Chronic  . Thrombocytosis     Chronic  . Hypertension   . History of Clostridium difficile infection   . Uses marijuana   . Chronic anticoagulation   . Functional asplenia   . Former smoker   . Second hand tobacco smoke exposure   . Alcohol consumption of one to four drinks per day    Past Surgical History  Procedure Laterality Date  . Right hip replacement      08/2006  . Cholecystectomy      01/2008  . Porta cath placement    . Porta cath removal    . Umbilical hernia repair      01/2008  . Excision of left periauricular cyst      10/2009  . Excision of right ear lobe cyst with primary closur      11/2007  . Portacath placement  01/05/2012    Procedure: INSERTION PORT-A-CATH;  Surgeon: Odis Hollingshead, MD;   Location: [facility];  Service: General;  Laterality: N/A;  ultrasound guiced port a cath insertion with fluoroscopy   Family History  Problem Relation Age of Onset  . Sickle cell anemia Mother   . Sickle cell anemia Father   . Sickle cell trait Brother    History  Substance Use Topics  . Smoking status: Former Smoker -- 13 years    Quit date: 07/08/2010  . Smokeless tobacco: Never Used  . Alcohol Use: No    Review of Systems  Constitutional: Negative for fever and chills.  HENT: Negative for sore throat.   Eyes: Negative for visual disturbance.  Respiratory: Negative for cough and shortness of breath.   Cardiovascular: Negative for chest pain and leg swelling.  Gastrointestinal: Negative for nausea, vomiting and diarrhea.  Genitourinary: Negative for dysuria.  Musculoskeletal: Positive for myalgias, back pain and arthralgias.  Skin: Negative for rash.  Neurological: Negative for weakness, numbness and headaches.    Allergies  Morphine and related  Home Medications   Prior to Admission medications   Medication Sig Start Date End Date Taking? Authorizing Provider  folic acid (FOLVITE) 1 MG tablet Take 1 tablet (1  mg total) by mouth every morning. 10/08/13   [name], MD  HYDROmorphone (DILAUDID) 4 MG tablet Take 1 tablet (4 mg total) by mouth every 4 (four) hours as needed for severe pain. 11/28/13   [name], MD  hydroxyurea (HYDREA) 500 MG capsule Take 3 capsules (1,500 mg total) by mouth daily. May take with food to minimize GI side effects. 10/30/13   [name], MD  levofloxacin (LEVAQUIN) 750 MG tablet Take 1 tablet (750 mg total) by mouth daily. Patient not taking: Reported on 12/22/2013 11/28/13   Elwyn Reach, MD  lisinopril (PRINIVIL,ZESTRIL) 10 MG tablet Take 1 tablet (10 mg total) by mouth daily. 11/19/13   [name], MD  metoprolol tartrate (LOPRESSOR) 25 MG tablet Take 1 tablet (25 mg total) by mouth daily. 08/22/13   [name], MD  morphine (MS CONTIN) 30 MG 12 hr tablet Take 1 tablet (30 mg total) by mouth every 12 (twelve) hours. 12/12/13   [name], MD  potassium chloride SA (K-DUR,KLOR-CON) 20 MEQ tablet Take 1 tablet (20 mEq total) by mouth every morning. 09/25/13   [name], MD  rivaroxaban (XARELTO) 20 MG TABS tablet Take 1 tablet (20 mg total) by mouth every morning. 10/24/13   Dorena Dew, FNP  zolpidem (AMBIEN) 10 MG tablet Take 1 tablet (10 mg total) by mouth at bedtime as needed for sleep. 10/28/13   Dorena Dew, FNP   BP 120/73 mmHg  Pulse 88  Temp(Src) 98 F (36.7 C) (Oral)  Resp 16  Wt 165 lb (74.844 kg)  SpO2 99% Physical Exam  Constitutional: He is oriented to person, place, and time. He appears well-developed and well-nourished. No distress.  HENT:  Head: Normocephalic and atraumatic.  Mouth/Throat: Oropharynx is clear and moist. No oropharyngeal exudate.  Eyes: Conjunctivae are normal.  Neck: Neck supple. No thyromegaly present.  Cardiovascular: Normal rate, regular rhythm and intact distal pulses.   Pulmonary/Chest: Effort normal and breath sounds normal. No respiratory distress. He has no wheezes. He has no rales. He exhibits no tenderness.    Abdominal: Soft. There is no tenderness.  Musculoskeletal: He exhibits tenderness.       Lumbar back: He exhibits pain.       Back:  Lymphadenopathy:    He has no cervical adenopathy.  Neurological: He is alert and oriented to person, place, and time.  Skin: Skin is warm and dry. No rash noted. He is not diaphoretic.  Psychiatric: He has a normal mood and affect.  Nursing note and vitals reviewed.   ED Course  Procedures (including critical care time) Labs Review Labs Reviewed  CBC WITH DIFFERENTIAL - Abnormal; Notable for the following:    WBC 20.6 (*)    RBC 1.92 (*)    Hemoglobin 6.6 (*)    HCT 18.4 (*)    MCH 34.4 (*)    RDW 22.8 (*)    Platelets 430 (*)    All other components within  normal limits  RETICULOCYTES - Abnormal; Notable for the following:    RBC. 1.92 (*)    All other components within normal limits  COMPREHENSIVE METABOLIC PANEL    Imaging Review Dg Chest 2 View  12/22/2013   CLINICAL DATA:  Fever, weakness and body aches. History of sickle cell anemia.  EXAM: CHEST - 2 VIEW  COMPARISON:  10/30/2013  FINDINGS: Stable appearance of left-sided port with catheter tip at the cavoatrial junction. Stable appearance of  pulmonary scarring bilaterally. There is no evidence of pulmonary edema, consolidation, pneumothorax, nodule or pleural fluid. The heart size is stable and at the upper limits of normal. The visualized bony thorax is unremarkable.  IMPRESSION: No active disease.  Stable pulmonary scarring.   Electronically Signed   By: Aletta Edouard M.D.   On: 12/22/2013 12:27     EKG Interpretation None      MDM   Final diagnoses:  Hypoxia  Sickle cell pain crisis   33 yo male presenting with sickle cell pain not relieved after 3 doses of meds 30 min apart. Hypoxic on room air. CBC, CMP, and Reticulocytes drawn. WBC: increased to 20.6, Absolute neutrophils increased to 15, Total Bilirubin: 6.9, Retic Ct Pct: 20.9.  1:42 PM: Consulted Dr. Ree Kida (hospitalist) for admission, accepted pt and will admit to inpt bed. The patient appears reasonably stabilized for admission considering the current resources, flow, and capabilities available in the ED at this time, and I doubt any other [facility] requiring further screening and/or treatment in the ED prior to admission.  Filed Vitals:   12/23/13 1022  BP: 120/73  Pulse: 88  Temp: 98 F (36.7 C)  TempSrc: Oral  Resp: 16  Weight: 165 lb (74.844 kg)  SpO2: 99%    Meds given in ED:  Medications  HYDROmorphone (DILAUDID) injection 1 mg (1 mg Intravenous Given 12/23/13 1151)    New Prescriptions   No medications on file      Britt Bottom, NP 12/24/13 2149  Shaune Pollack, MD 12/27/13 2330

## 2013-12-23 NOTE — ED Notes (Signed)
Patient transported to X-ray 

## 2013-12-23 NOTE — H&P (Signed)
[facility] History and Physical  AMR STURTEVANT OVF:[id number] DOB: 08-20-1980 DOA: 12/23/2013  Referring physician:  PCP: [name],[name], MD  Specialists:   Chief Complaint: Sickle cell crisis  HPI: [name] is a 33 y.o. male  With a history of sickle cell anemia, pulmonary embolism, hypertension that presented to the emergency department with complaints of hip and back pain. Patient had presented to the emergency department on 11/22 at which point he was given pain medications and felt well enough to go home. Patient comes back today, 11/23 for pain. Patient stated that he ran out of his home pain medications for approximately 1 week. He was unable to see his doctor as he could not find a ride. Currently patient complains of nagging chronic pain in his ribs bilaterally as well as his back. He denies chest pain, SOB, cough, fever, chills, recent travel or sick contacts.  Patient does admit to having one episode of vomiting at home yesterday after returning from the emergency department. At this time he denies abdominal pain, nausea, vomiting, constipation or diarrhea.  Review of Systems:  Constitutional: Denies fever, chills, diaphoresis, appetite change and fatigue.  HEENT: Denies photophobia, eye pain, redness, hearing loss, ear pain, congestion, sore throat, rhinorrhea, sneezing, mouth sores, trouble swallowing, neck pain, neck stiffness and tinnitus.   Respiratory: Denies SOB, DOE, cough, chest tightness,  and wheezing.   Cardiovascular: Denies chest pain, palpitations and leg swelling.  Gastrointestinal: Had one episode of vomiting yesterday however today no nausea or vomiting or abdominal pain.  Genitourinary: Denies dysuria, urgency, frequency, hematuria, flank pain and difficulty urinating.  Musculoskeletal: Complains of rib and back pain. Skin: Denies pallor, rash and wound.  Neurological: Denies dizziness, seizures, syncope, weakness, light-headedness,  numbness and headaches.  Hematological: Denies adenopathy. Easy bruising, personal or family bleeding history  Psychiatric/Behavioral: Denies suicidal ideation, mood changes, confusion, nervousness, sleep disturbance and agitation  Past Medical History  Diagnosis Date  . Sickle cell anemia   . Blood transfusion   . Acute embolism and thrombosis of right internal jugular vein   . Hypokalemia   . Mood disorder   . History of pulmonary embolus (PE)   . Avascular necrosis   . Leukocytosis     Chronic  . Thrombocytosis     Chronic  . Hypertension   . History of Clostridium difficile infection   . Uses marijuana   . Chronic anticoagulation   . Functional asplenia   . Former smoker   . Second hand tobacco smoke exposure   . Alcohol consumption of one to four drinks per day    Past Surgical History  Procedure Laterality Date  . Right hip replacement      08/2006  . Cholecystectomy      01/2008  . Porta cath placement    . Porta cath removal    . Umbilical hernia repair      01/2008  . Excision of left periauricular cyst      10/2009  . Excision of right ear lobe cyst with primary closur      11/2007  . Portacath placement  01/05/2012    Procedure: INSERTION PORT-A-CATH;  Surgeon: Odis Hollingshead, MD;  Location: [place];  Service: General;  Laterality: N/A;  ultrasound guiced port a cath insertion with fluoroscopy   Social History:  reports that he quit smoking about 3 years ago. He has never used smokeless tobacco. He reports that he uses illicit drugs (Marijuana) about twice per week. He  reports that he does not drink alcohol.   Allergies  Allergen Reactions  . Morphine And Related Hives and Rash    Pt states he can take PO, but not IV and he is able to tolerate Dilaudid with no reactions.    Family History  Problem Relation Age of Onset  . Sickle cell anemia Mother   . Sickle cell anemia Father   . Sickle cell trait Brother     Prior to Admission medications     Medication Sig Start Date End Date Taking? Authorizing Provider  folic acid (FOLVITE) 1 MG tablet Take 1 tablet (1 mg total) by mouth every morning. 10/08/13  Yes [name], MD  HYDROmorphone (DILAUDID) 4 MG tablet Take 1 tablet (4 mg total) by mouth every 4 (four) hours as needed for severe pain. 11/28/13  Yes [name], MD  hydroxyurea (HYDREA) 500 MG capsule Take 3 capsules (1,500 mg total) by mouth daily. May take with food to minimize GI side effects. 10/30/13  Yes [name], MD  lisinopril (PRINIVIL,ZESTRIL) 10 MG tablet Take 1 tablet (10 mg total) by mouth daily. 11/19/13  Yes [name], MD  metoprolol tartrate (LOPRESSOR) 25 MG tablet Take 1 tablet (25 mg total) by mouth daily. 08/22/13  Yes [name], MD  potassium chloride SA (K-DUR,KLOR-CON) 20 MEQ tablet Take 1 tablet (20 mEq total) by mouth every morning. 09/25/13  Yes [name], MD  zolpidem (AMBIEN) 10 MG tablet Take 1 tablet (10 mg total) by mouth at bedtime as needed for sleep. 10/28/13  Yes Dorena Dew, FNP  levofloxacin (LEVAQUIN) 750 MG tablet Take 1 tablet (750 mg total) by mouth daily. Patient not taking: Reported on 12/22/2013 11/28/13   Elwyn Reach, MD  morphine (MS CONTIN) 30 MG 12 hr tablet Take 1 tablet (30 mg total) by mouth every 12 (twelve) hours. Patient not taking: Reported on 12/23/2013 12/12/13   [name], MD  rivaroxaban (XARELTO) 20 MG TABS tablet Take 1 tablet (20 mg total) by mouth every morning. Patient not taking: Reported on 12/23/2013 10/24/13   Dorena Dew, FNP   Physical Exam: Filed Vitals:   12/23/13 1300  BP:   Pulse: 76  Temp:   Resp:      General: Well developed, well nourished, NAD, appears stated age  HEENT: NCAT, PERRLA, EOMI, Anicteic Sclera, mucous membranes moist.   Neck: Supple, no JVD, no masses  Cardiovascular: S1 S2 auscultated, no rubs, murmurs or gallops. Regular rate and rhythm.  Respiratory:  Clear to auscultation bilaterally with equal chest rise  Abdomen: Soft, nontender, nondistended, + bowel sounds  Extremities: warm dry without cyanosis clubbing or edema  Neuro: AAOx3, cranial nerves grossly intact. Strength 5/5 in patient's upper and lower extremities bilaterally  Skin: Without rashes exudates or nodules, multiple tattoos  Psych: Appropriate  Labs on Admission:  Basic Metabolic Panel:  Recent Labs Lab 12/22/13 1124 12/23/13 1105  NA 144 141  K 4.0 4.2  CL 108 107  CO2 20 21  GLUCOSE 122* 101*  BUN 6 8  CREATININE 0.67 0.61  CALCIUM 8.9 9.0   Liver Function Tests:  Recent Labs Lab 12/22/13 1124 12/23/13 1105  AST 35 37  ALT 19 20  ALKPHOS 75 83  BILITOT 6.6* 6.9*  PROT 7.7 7.4  ALBUMIN 4.2 4.1   No results for input(s): LIPASE, AMYLASE in the last 168 hours. No results for input(s): AMMONIA in the last 168 hours.  CBC:  Recent Labs Lab 12/22/13 1124 12/23/13 1105  WBC 14.6* 20.6*  NEUTROABS 10.1* 15.0*  HGB 6.8* 6.6*  HCT 19.0* 18.4*  MCV 96.4 95.8  PLT 443* 430*   Cardiac Enzymes: No results for input(s): CKTOTAL, CKMB, CKMBINDEX, TROPONINI in the last 168 hours.  BNP (last 3 results) No results for input(s): PROBNP in the last 8760 hours. CBG: No results for input(s): GLUCAP in the last 168 hours.  Radiological Exams on Admission: Dg Chest 2 View  12/23/2013   CLINICAL DATA:  Sickle cell crisis, rib pain  EXAM: CHEST  2 VIEW  COMPARISON:  12/22/2013  FINDINGS: LEFT subclavian power port with tip in SVC.  Enlargement of cardiac silhouette with pulmonary vascular congestion.  Moderate peribronchial thickening with accentuation of perihilar markings.  Minimal RIGHT base atelectasis.  Minimal scarring RIGHT mid lung and LEFT lower lobe.  No acute infiltrate, pleural effusion or pneumothorax.  Bones unremarkable.  IMPRESSION: Enlargement of cardiac silhouette with pulmonary vascular congestion.  Moderate bronchitic changes with RIGHT  basilar atelectasis and scattered scarring.  No acute abnormalities.   Electronically Signed   By: Lavonia Dana M.D.   On: 12/23/2013 13:02   Dg Chest 2 View  12/22/2013   CLINICAL DATA:  Fever, weakness and body aches. History of sickle cell anemia.  EXAM: CHEST - 2 VIEW  COMPARISON:  10/30/2013  FINDINGS: Stable appearance of left-sided port with catheter tip at the cavoatrial junction. Stable appearance of pulmonary scarring bilaterally. There is no evidence of pulmonary edema, consolidation, pneumothorax, nodule or pleural fluid. The heart size is stable and at the upper limits of normal. The visualized bony thorax is unremarkable.  IMPRESSION: No active disease.  Stable pulmonary scarring.   Electronically Signed   By: Aletta Edouard M.D.   On: 12/22/2013 12:27    EKG: Independently reviewed. SR, rate 78, RSR' V1/V2  Assessment/Plan  Acute sickle cell crisis -Patient will be admitted to the medical floor -Will place patient on IV fluid with D5 half normal at 100 mLs per hour -Will place patient back on his home medications, MS Contin and Dilaudid, as he states he's been out for approximately 1 week -Will continue on Benadryl, Toradol, hydroxyurea, folic acid -Will place him on Dilaudid PCA pump -Sickle cell team made aware patient  Acute hypoxic respiratory failure  -likely secondary to the above -Continue supplemental oxygen to maintain saturations above 92% -CXR negative for infection  Sickle cell anemia -Hemoglobin appears to be at baseline, continue monitor CBC  Pulmonary embolism -Continue Xarelto   Chronic Leukocytosis -Chest x-ray and UA negative for infection -Patient afebrile -Likely reactive, continue to monitor CBC  Hypertension -Continue lisinopril  Insomnia -Continue Ambien  DVT prophylaxis: Xarelto  Code Status: Full  Condition: Guarded  Family Communication: None at bedside. Admission, patients condition and plan of care including tests being ordered  have been discussed with the patient and who indicates understanding and agrees with the plan and Code Status.  Disposition Plan: Admitted  Time spent: 60 minutes  Timmya Blazier D.O. [facility] Pager [phone]  If 7PM-7AM, please contact night-coverage [email or web address] Password [email or web address] 12/23/2013, 2:07 PM

## 2013-12-23 NOTE — Progress Notes (Signed)
Patient recently became active with [facility] for community resources. [facility] LCSW went to patient's home today for home visit but EMS was at patient's home enroute to take patient to ED. [facility] LCSW is working with patient for community resources such as SCAT and mobile meals. Noted patient has non-compliance with medications as well. Will request [facility] RN to follow up as well.  Will continue to follow and make inpatient RNCM aware that [facility] is following.  Marthenia Rolling, MSN- RN, [facility] [phone]

## 2013-12-23 NOTE — ED Notes (Signed)
Pt alert, arrives from home, c/o pain associated with SCC, onset was yesterday, resp even unlabored, skin pwd

## 2013-12-23 NOTE — Plan of Care (Signed)
Problem: Phase I Progression Outcomes Goal: Pain controlled with appropriate interventions Outcome: Completed/Met Date Met:  12/23/13 Goal: Pt. aware of pain management plan Outcome: Completed/Met Date Met:  12/23/13

## 2013-12-23 NOTE — ED Notes (Signed)
PER EMS - pt from home with c/o SCC, full body pain, out of home meds  x1 week, pain worsening x24 hrs. Ambulatory with steady gait.

## 2013-12-24 ENCOUNTER — Telehealth: Payer: Self-pay | Admitting: Internal Medicine

## 2013-12-24 ENCOUNTER — Other Ambulatory Visit: Payer: Self-pay

## 2013-12-24 DIAGNOSIS — R0902 Hypoxemia: Secondary | ICD-10-CM

## 2013-12-24 DIAGNOSIS — D5700 Hb-SS disease with crisis, unspecified: Principal | ICD-10-CM

## 2013-12-24 DIAGNOSIS — IMO0002 Reserved for concepts with insufficient information to code with codable children: Secondary | ICD-10-CM | POA: Insufficient documentation

## 2013-12-24 DIAGNOSIS — M549 Dorsalgia, unspecified: Secondary | ICD-10-CM | POA: Diagnosis not present

## 2013-12-24 DIAGNOSIS — H4941 Progressive external ophthalmoplegia, right eye: Secondary | ICD-10-CM | POA: Diagnosis not present

## 2013-12-24 DIAGNOSIS — Z86711 Personal history of pulmonary embolism: Secondary | ICD-10-CM

## 2013-12-24 LAB — BASIC METABOLIC PANEL
Anion gap: 13 (ref 5–15)
BUN: 6 mg/dL (ref 6–23)
CALCIUM: 8.8 mg/dL (ref 8.4–10.5)
CO2: 21 meq/L (ref 19–32)
Chloride: 106 mEq/L (ref 96–112)
Creatinine, Ser: 0.62 mg/dL (ref 0.50–1.35)
GFR calc Af Amer: >90 mL/min (ref >90)
GFR calc non Af Amer: >90 mL/min (ref >90)
GLUCOSE: 109 mg/dL — AB (ref 70–99)
Potassium: 3.5 mEq/L — ABNORMAL LOW (ref 3.7–5.3)
Sodium: 140 mEq/L (ref 137–147)

## 2013-12-24 LAB — CBC WITH DIFFERENTIAL/PLATELET
BASOS ABS: 0 10*3/uL (ref 0.0–0.1)
Basophils Relative: 0 % (ref 0–1)
EOS ABS: 0.6 10*3/uL (ref 0.0–0.7)
Eosinophils Relative: 3 % (ref 0–5)
HCT: 18.8 % — ABNORMAL LOW (ref 39.0–52.0)
Hemoglobin: 6.6 g/dL — CL (ref 13.0–17.0)
LYMPHS ABS: 3.4 10*3/uL (ref 0.7–4.0)
Lymphocytes Relative: 17 % (ref 12–46)
MCH: 34.4 pg — ABNORMAL HIGH (ref 26.0–34.0)
MCHC: 35.1 g/dL (ref 30.0–36.0)
MCV: 97.9 fL (ref 78.0–100.0)
MONO ABS: 2.4 10*3/uL — AB (ref 0.1–1.0)
Monocytes Relative: 12 % (ref 3–12)
NEUTROS PCT: 68 % (ref 43–77)
Neutro Abs: 13.5 10*3/uL — ABNORMAL HIGH (ref 1.7–7.7)
PLATELETS: 425 10*3/uL — AB (ref 150–400)
RBC: 1.92 MIL/uL — ABNORMAL LOW (ref 4.22–5.81)
RDW: 22.7 % — ABNORMAL HIGH (ref 11.5–15.5)
WBC: 19.9 10*3/uL — ABNORMAL HIGH (ref 4.0–10.5)
nRBC: 11 /100 WBC — ABNORMAL HIGH

## 2013-12-24 LAB — LACTATE DEHYDROGENASE: LDH: 566 U/L — ABNORMAL HIGH (ref 94–250)

## 2013-12-24 LAB — RETICULOCYTES
RBC.: 1.92 MIL/uL — ABNORMAL LOW (ref 4.22–5.81)
Retic Ct Pct: >23 % — ABNORMAL HIGH (ref 0.4–3.1)

## 2013-12-24 LAB — MAGNESIUM: MAGNESIUM: 1.5 mg/dL (ref 1.5–2.5)

## 2013-12-24 LAB — D-DIMER, QUANTITATIVE (NOT AT ARMC): D DIMER QUANT: 0.66 ug{FEU}/mL — AB (ref 0.00–0.48)

## 2013-12-24 MED ORDER — HYDROMORPHONE HCL 4 MG PO TABS: 4.0000 mg | ORAL_TABLET | ORAL | Status: DC | PRN

## 2013-12-24 MED ORDER — POTASSIUM CHLORIDE CRYS ER 20 MEQ PO TBCR
40.0000 meq | EXTENDED_RELEASE_TABLET | ORAL | Status: DC
  Filled 2013-12-24 (×2): qty 2

## 2013-12-24 MED ORDER — HYDROMORPHONE 2 MG/ML HIGH CONCENTRATION IV PCA SOLN
INTRAVENOUS | Status: DC
  Administered 2013-12-23: 13.99 mg via INTRAVENOUS
  Administered 2013-12-24: 4.2 mg via INTRAVENOUS
  Administered 2013-12-24: 24 mg via INTRAVENOUS
  Administered 2013-12-24: 3.5 mg via INTRAVENOUS

## 2013-12-24 MED ORDER — DIPHENHYDRAMINE HCL 12.5 MG/5ML PO ELIX: 12.5000 mg | ORAL_SOLUTION | Freq: Four times a day (QID) | ORAL | Status: DC | PRN

## 2013-12-24 MED ORDER — DIPHENHYDRAMINE HCL 25 MG PO CAPS: 25.0000 mg | ORAL_CAPSULE | Freq: Four times a day (QID) | ORAL | Status: DC | PRN

## 2013-12-24 MED ORDER — HEPARIN SOD (PORK) LOCK FLUSH 100 UNIT/ML IV SOLN
500.0000 [IU] | Freq: Once | INTRAVENOUS | Status: DC
  Filled 2013-12-24: qty 5

## 2013-12-24 MED ORDER — DIPHENHYDRAMINE HCL 50 MG/ML IJ SOLN
12.5000 mg | Freq: Four times a day (QID) | INTRAMUSCULAR | Status: DC | PRN
  Administered 2013-12-24: 12.5 mg via INTRAVENOUS
  Filled 2013-12-24 (×3): qty 0.25

## 2013-12-24 MED ORDER — SODIUM CHLORIDE 0.9 % IV SOLN
25.0000 mg | INTRAVENOUS | Status: DC | PRN
  Administered 2013-12-24: 25 mg via INTRAVENOUS
  Filled 2013-12-24 (×3): qty 0.5

## 2013-12-24 NOTE — Progress Notes (Signed)
Mr. Pardo PCA was d/c'ed with 13 mL = 26 mg of Dilaudid left in the syringe which was wasted in the sink.

## 2013-12-24 NOTE — Progress Notes (Signed)
[name] and this RN walked in the hall to measure his O2 sat with activity. His O2 sat dropped to 84 - 85%, but did recover to 92 - 94% with continued activity, On return to the room, [name]'s O2 sat dropped to 85% and slowly returned to 90%. He was replaced on the nasal canula at 2 L/min and his O2 returned to 96%. MD aware

## 2013-12-24 NOTE — Telephone Encounter (Signed)
Prescription re-written for Dilaudid 4 mg #90 tabs.

## 2013-12-24 NOTE — Telephone Encounter (Signed)
Refill request for Dilaudid 4 mg tablets

## 2013-12-24 NOTE — Progress Notes (Signed)
Nutrition Brief Note  Patient identified on the Malnutrition Screening Tool (MST) Report  Pt reports UBW of 175 lb, pt states he lost weight 1.5-2 years ago d/t a break-up. Per weight history documentation, pt's weight has been fluctuating between 156-170 lb. Pt reports good appetite and eating well PTA and currently.  Wt Readings from Last 15 Encounters:  12/23/13 165 lb (74.844 kg)  12/12/13 163 lb (73.936 kg)  11/26/13 156 lb 1.6 oz (70.806 kg)  11/07/13 156 lb (70.761 kg)  10/30/13 156 lb (70.761 kg)  10/17/13 163 lb (73.936 kg)  10/07/13 163 lb 6.4 oz (74.118 kg)  09/22/13 164 lb 11.2 oz (74.707 kg)  09/03/13 163 lb (73.936 kg)  08/29/13 170 lb 10.2 oz (77.4 kg)  08/22/13 166 lb (75.297 kg)  07/29/13 170 lb (77.111 kg)  07/27/13 170 lb (77.111 kg)  07/23/13 168 lb (76.204 kg)  07/10/13 168 lb (76.204 kg)    Body mass index is 22.37 kg/(m^2). Patient meets criteria for normal range based on current BMI.   Current diet order is regular, patient is consuming approximately 100% of meals at this time. Labs and medications reviewed.   No nutrition interventions warranted at this time. If nutrition issues arise, please consult RD.   Clayton Bibles, MS, RD, LDN Pager: [phone] After Hours Pager: [phone]

## 2013-12-24 NOTE — Progress Notes (Signed)
SICKLE CELL SERVICE PROGRESS NOTE  [name] XAJ:[id number] DOB: 1980/07/29 DOA: 12/23/2013 PCP: Terryl Niziolek A., MD  Assessment/Plan: Principal Problem:   Acute sickle cell crisis Active Problems:   Hx of pulmonary embolus   Acute respiratory failure with hypoxia   Sickle cell anemia with pain   Chronic anticoagulation   HTN (hypertension)  1. Hb SS with crisis: Pt states that he has been without transportation to obtain his medications and had no analgesics to treat his pain thus came to the ED. He has used 33.2 mg for the last 19 hours. I will continue PCA at current dose and continue MS Contin and Toradol.  2. Hypoxemia: Pt s well known to me and his baseline saturations are usually 96-97% on RA. He is currently requiring 2 L/min of Oxygen and his saturations have decreased to 84-85% with ambulation. He reports that he has been without his Xarelto for more than a week due to lack of transportation to obtain his medications. In light of this I will obtain a CTA to eliminate PE as a cause of hypoxia.  3. Anemia: Hb currently at baseline 4. Medication non-compliance: Pt has not been compliant with medications due to challenges with transportation. He has been evaluated by Case Management from [facility] and reports that he expects to obtain an automobile today which should improve his compliance with Medical care.  Code Status: Full Code Family Communication: N/A Disposition Plan: Not yet ready for discharge  [place].  Pager [phone]. If 7PM-7AM, please contact night-coverage.  12/24/2013, 2:29 PM  LOS: 1 day   Consultants:  None  Procedures:  None  Antibiotics:  None  HPI/Subjective: Pt states that he feels better and wants to be discharged. I have explained to patient that given his non-compliance with Xarelto, hypoxemia and chest wall pain, it is important to evaluate to ensure that he does not have a PE.    Objective: Filed Vitals:   12/24/13 0400  12/24/13 0651 12/24/13 0841 12/24/13 0950  BP:  123/78  143/96  Pulse:  87  96  Temp:  98.8 F (37.1 C)  98.6 F (37 C)  TempSrc:  Oral  Oral  Resp: 26 15 19 17   Height:      Weight:      SpO2: 95% 94% 92% 94%   Weight change:   Intake/Output Summary (Last 24 hours) at 12/24/13 1429 Last data filed at 12/24/13 0725  Gross per 24 hour  Intake   1983 ml  Output    450 ml  Net   1533 ml    General: Alert, awake, oriented x3, in no acute distress.  HEENT: [employer]/AT PEERL, EOMI, mild icterus. Pt also has right lateral strabismus which is more pronounced than previously. Neck: Trachea midline,  no masses, no thyromegal,y no JVD, no carotid bruit OROPHARYNX:  Moist, No exudate/ erythema/lesions.  Heart: Regular rate and rhythm, without murmurs, rubs, gallops.  Lungs: Clear to auscultation, no wheezing or rhonchi noted. Breath sounds decreased at bases. Abdomen: Soft, nontender, nondistended, positive bowel sounds, no masses no hepatosplenomegaly noted..  Neuro: No focal neurological deficits noted cranial nerves II through XII grossly intact. DTRs 2+ bilaterally upper and lower extremities. Strength functional in bilateral upper and lower extremities. Musculoskeletal: No warm swelling or erythema around joints, no spinal tenderness noted. Psychiatric: Patient alert and oriented x3, good insight and cognition, good recent to remote recall. Lymph node survey: No cervical axillary or inguinal lymphadenopathy noted.   Data Reviewed: Basic Metabolic  Panel:  Recent Labs Lab 12/22/13 1124 12/23/13 1105 12/24/13 0535  NA 144 141 140  K 4.0 4.2 3.5*  CL 108 107 106  CO2 20 21 21   GLUCOSE 122* 101* 109*  BUN 6 8 6   CREATININE 0.67 0.61 0.62  CALCIUM 8.9 9.0 8.8  MG  --  1.8 1.5   Liver Function Tests:  Recent Labs Lab 12/22/13 1124 12/23/13 1105  AST 35 37  ALT 19 20  ALKPHOS 75 83  BILITOT 6.6* 6.9*  PROT 7.7 7.4  ALBUMIN 4.2 4.1   No results for input(s): LIPASE,  AMYLASE in the last 168 hours. No results for input(s): AMMONIA in the last 168 hours. CBC:  Recent Labs Lab 12/22/13 1124 12/23/13 1105 12/24/13 0535  WBC 14.6* 20.6* 19.9*  NEUTROABS 10.1* 15.0* 13.5*  HGB 6.8* 6.6* 6.6*  HCT 19.0* 18.4* 18.8*  MCV 96.4 95.8 97.9  PLT 443* 430* 425*   Cardiac Enzymes: No results for input(s): CKTOTAL, CKMB, CKMBINDEX, TROPONINI in the last 168 hours. BNP (last 3 results) No results for input(s): PROBNP in the last 8760 hours. CBG: No results for input(s): GLUCAP in the last 168 hours.  No results found for this or any previous visit (from the past 240 hour(s)).   Studies: Dg Chest 2 View  12/23/2013   CLINICAL DATA:  Sickle cell crisis, rib pain  EXAM: CHEST  2 VIEW  COMPARISON:  12/22/2013  FINDINGS: LEFT subclavian power port with tip in SVC.  Enlargement of cardiac silhouette with pulmonary vascular congestion.  Moderate peribronchial thickening with accentuation of perihilar markings.  Minimal RIGHT base atelectasis.  Minimal scarring RIGHT mid lung and LEFT lower lobe.  No acute infiltrate, pleural effusion or pneumothorax.  Bones unremarkable.  IMPRESSION: Enlargement of cardiac silhouette with pulmonary vascular congestion.  Moderate bronchitic changes with RIGHT basilar atelectasis and scattered scarring.  No acute abnormalities.   Electronically Signed   By: Lavonia Dana M.D.   On: 12/23/2013 13:02   Dg Chest 2 View  12/22/2013   CLINICAL DATA:  Fever, weakness and body aches. History of sickle cell anemia.  EXAM: CHEST - 2 VIEW  COMPARISON:  10/30/2013  FINDINGS: Stable appearance of left-sided port with catheter tip at the cavoatrial junction. Stable appearance of pulmonary scarring bilaterally. There is no evidence of pulmonary edema, consolidation, pneumothorax, nodule or pleural fluid. The heart size is stable and at the upper limits of normal. The visualized bony thorax is unremarkable.  IMPRESSION: No active disease.  Stable pulmonary  scarring.   Electronically Signed   By: Aletta Edouard M.D.   On: 12/22/2013 12:27   Ct Angio Chest Pe W/cm &/or Wo Cm  11/25/2013   CLINICAL DATA:  Sickle cell trait. Lower back and bilateral leg pain. Mild shortness of breath. Initial encounter  EXAM: CT ANGIOGRAPHY CHEST WITH CONTRAST  TECHNIQUE: Multidetector CT imaging of the chest was performed using the standard protocol during bolus administration of intravenous contrast. Multiplanar CT image reconstructions and MIPs were obtained to evaluate the vascular anatomy.  CONTRAST:  14mL OMNIPAQUE IOHEXOL 350 MG/ML SOLN  COMPARISON:  08/26/2013  FINDINGS: THORACIC INLET/BODY WALL:  Left subclavian porta catheter in unremarkable position. No acute findings.  MEDIASTINUM:  Chronic cardiomegaly. No pericardial effusion. There is right heart enlargement and main pulmonary artery enlargement (42 mm) suggesting pulmonary hypertension. As permitted by bolus dispersion and intermittent respiratory motion, no pulmonary embolism. No aortic dissection or aneurysm. Chronic mediastinal lymphadenopathy, especially superior and anterior mediastinal.  Thymus remains visible and stable from previous imaging.  LUNG WINDOWS:  There is chronic interstitial opacities in the lower lungs with volume loss, likely a combination of scarring and atelectasis. Patchy reticular nodular opacities are also chronic. No definitive pneumonia or edema. No effusion or pneumothorax.  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  OSSEOUS:  Typical osseous changes of sickle cell disease with thoracic endplate concavities and bilateral humeral head osteonecrosis.  Review of the MIP images confirms the above findings.  IMPRESSION: 1. Negative for pulmonary embolism. 2. Chronic lung disease without definite pneumonia or edema. 3. Chronic findings are stable from priors and noted above.   Electronically Signed   By:  Jorje Guild M.D.   On: 11/25/2013 22:53    Scheduled Meds: . folic acid  1 mg Oral q morning - 10a  . heparin lock flush  500 Units Intracatheter Once  . HYDROmorphone PCA 2 mg/mL   Intravenous 6 times per day  . hydroxyurea  1,500 mg Oral Q breakfast  . ketorolac  30 mg Intravenous 4 times per day  . lisinopril  10 mg Oral Daily  . metoprolol tartrate  25 mg Oral Daily  . morphine  30 mg Oral Q12H  . potassium chloride SA  20 mEq Oral q morning - 10a  . potassium chloride  40 mEq Oral Q2H  . rivaroxaban  20 mg Oral Q supper  . senna-docusate  1 tablet Oral BID   Continuous Infusions: . dextrose 5 % and 0.45% NaCl 100 mL/hr at 12/24/13 0300    Time spent 40 minutes.

## 2013-12-31 IMAGING — CR DG CHEST 2V
2 series · 2 of 2 positions shown · non-contrast
Comparison: [DATE] and 01/04/2011

CLINICAL DATA: Left posterior lateral and lower chest pain. Sickle
cell disease.

CHEST - 2 VIEW

[w chest pa]
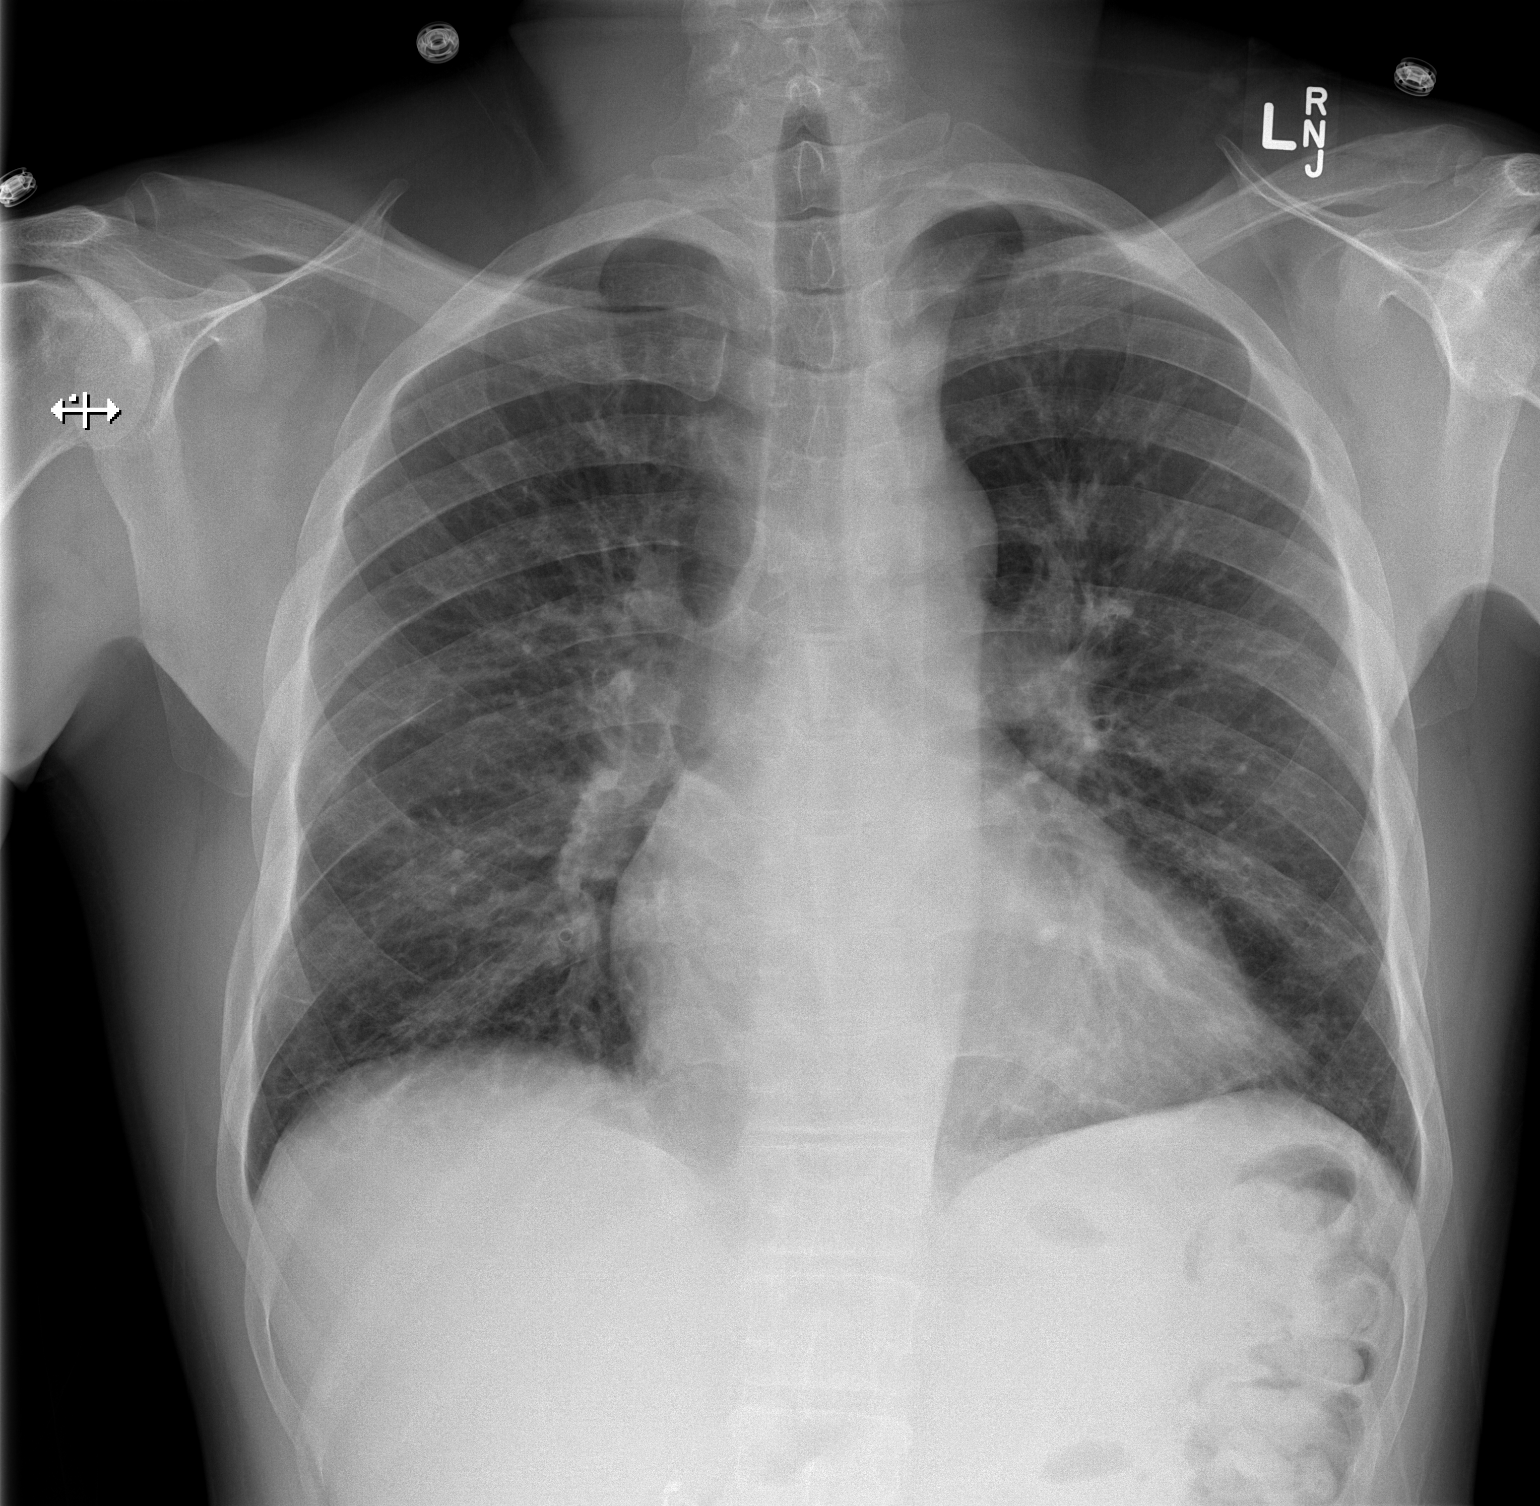

[w chest lat]
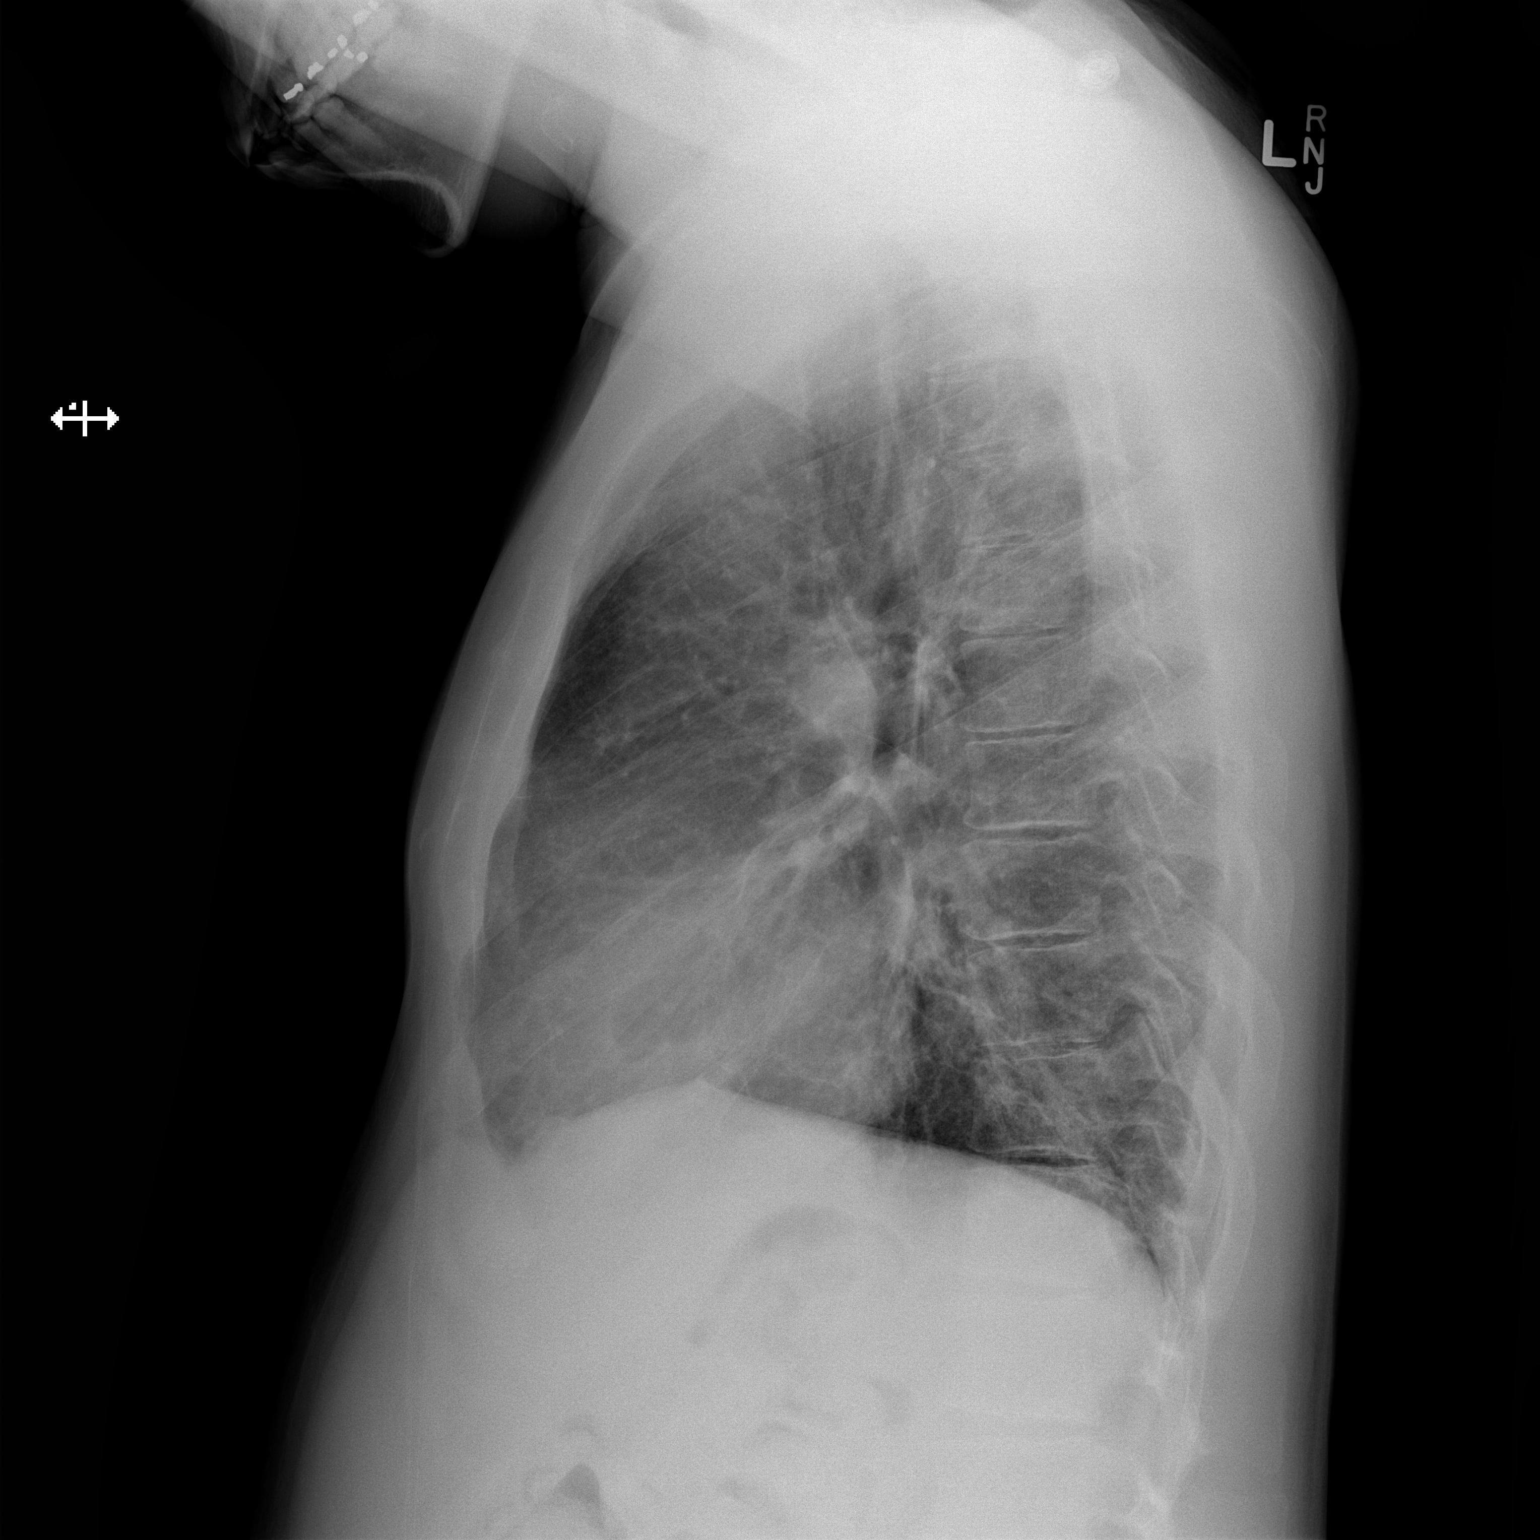

[2 of 2 positions shown; findings below may reference images not displayed]

FINDINGS: Stable cardiomegaly.  Pulmonary vascularity is normal.
Stable slight scarring at the lung bases.  No acute infiltrates or
effusions.  No osseous abnormality.
IMPRESSION: No acute disease.

## 2014-01-01 ENCOUNTER — Inpatient Hospital Stay (HOSPITAL_COMMUNITY)
Admission: EM | Admit: 2014-01-01 | Discharge: 2014-01-06 | DRG: 175 | Disposition: A | Discharge status: Home / Self Care | Payer: Medicare Other | Attending: Internal Medicine | Admitting: Internal Medicine

## 2014-01-01 ENCOUNTER — Encounter (HOSPITAL_COMMUNITY): Payer: Self-pay

## 2014-01-01 ENCOUNTER — Ambulatory Visit (HOSPITAL_COMMUNITY): Payer: Medicare Other

## 2014-01-01 ENCOUNTER — Emergency Department (HOSPITAL_COMMUNITY): Payer: Medicare Other

## 2014-01-01 DIAGNOSIS — Z9119 Patient's noncompliance with other medical treatment and regimen: Secondary | ICD-10-CM | POA: Diagnosis not present

## 2014-01-01 DIAGNOSIS — I2699 Other pulmonary embolism without acute cor pulmonale: Secondary | ICD-10-CM | POA: Diagnosis not present

## 2014-01-01 DIAGNOSIS — D649 Anemia, unspecified: Secondary | ICD-10-CM

## 2014-01-01 DIAGNOSIS — Z832 Family history of diseases of the blood and blood-forming organs and certain disorders involving the immune mechanism: Secondary | ICD-10-CM | POA: Diagnosis not present

## 2014-01-01 DIAGNOSIS — Z7901 Long term (current) use of anticoagulants: Secondary | ICD-10-CM

## 2014-01-01 DIAGNOSIS — R079 Chest pain, unspecified: Secondary | ICD-10-CM

## 2014-01-01 DIAGNOSIS — R0902 Hypoxemia: Secondary | ICD-10-CM | POA: Diagnosis present

## 2014-01-01 DIAGNOSIS — R0602 Shortness of breath: Secondary | ICD-10-CM | POA: Diagnosis not present

## 2014-01-01 DIAGNOSIS — R000 Tachycardia, unspecified: Secondary | ICD-10-CM

## 2014-01-01 DIAGNOSIS — D72829 Elevated white blood cell count, unspecified: Secondary | ICD-10-CM | POA: Diagnosis present

## 2014-01-01 DIAGNOSIS — D571 Sickle-cell disease without crisis: Secondary | ICD-10-CM | POA: Diagnosis not present

## 2014-01-01 DIAGNOSIS — Z87891 Personal history of nicotine dependence: Secondary | ICD-10-CM

## 2014-01-01 DIAGNOSIS — Z9114 Patient's other noncompliance with medication regimen: Secondary | ICD-10-CM | POA: Diagnosis present

## 2014-01-01 DIAGNOSIS — Z86711 Personal history of pulmonary embolism: Secondary | ICD-10-CM | POA: Diagnosis not present

## 2014-01-01 DIAGNOSIS — D5700 Hb-SS disease with crisis, unspecified: Secondary | ICD-10-CM | POA: Diagnosis present

## 2014-01-01 DIAGNOSIS — R062 Wheezing: Secondary | ICD-10-CM | POA: Diagnosis not present

## 2014-01-01 DIAGNOSIS — R110 Nausea: Secondary | ICD-10-CM | POA: Diagnosis present

## 2014-01-01 DIAGNOSIS — D5701 Hb-SS disease with acute chest syndrome: Secondary | ICD-10-CM | POA: Diagnosis not present

## 2014-01-01 DIAGNOSIS — J9811 Atelectasis: Secondary | ICD-10-CM | POA: Diagnosis not present

## 2014-01-01 DIAGNOSIS — I5031 Acute diastolic (congestive) heart failure: Secondary | ICD-10-CM | POA: Diagnosis not present

## 2014-01-01 DIAGNOSIS — Z96641 Presence of right artificial hip joint: Secondary | ICD-10-CM | POA: Diagnosis not present

## 2014-01-01 DIAGNOSIS — R0989 Other specified symptoms and signs involving the circulatory and respiratory systems: Secondary | ICD-10-CM | POA: Diagnosis not present

## 2014-01-01 DIAGNOSIS — I2489 Other forms of acute ischemic heart disease: Secondary | ICD-10-CM

## 2014-01-01 DIAGNOSIS — I248 Other forms of acute ischemic heart disease: Secondary | ICD-10-CM | POA: Diagnosis not present

## 2014-01-01 DIAGNOSIS — I272 Other secondary pulmonary hypertension: Secondary | ICD-10-CM | POA: Diagnosis present

## 2014-01-01 DIAGNOSIS — J984 Other disorders of lung: Secondary | ICD-10-CM | POA: Diagnosis not present

## 2014-01-01 DIAGNOSIS — I10 Essential (primary) hypertension: Secondary | ICD-10-CM | POA: Diagnosis not present

## 2014-01-01 HISTORY — DX: Patient's other noncompliance with medication regimen: Z91.14

## 2014-01-01 HISTORY — DX: Patient's other noncompliance with medication regimen for other reason: Z91.148

## 2014-01-01 LAB — BLOOD GAS, ARTERIAL
Acid-base deficit: 4.2 mmol/L — ABNORMAL HIGH (ref 0.0–2.0)
Acid-base deficit: 3.4 mmol/L — ABNORMAL HIGH (ref 0.0–2.0)
Bicarbonate: 19.9 mEq/L — ABNORMAL LOW (ref 20.0–24.0)
Bicarbonate: 20.9 mEq/L (ref 20.0–24.0)
Drawn by: 257701
Drawn by: 235321
FIO2: 0.45 %
O2 Content: 6 L/min
O2 SAT: 92.8 %
O2 Saturation: 83.6 %
PATIENT TEMPERATURE: 98.6
PCO2 ART: 37 mmHg (ref 35.0–45.0)
PH ART: 7.372 (ref 7.350–7.450)
PO2 ART: 97.2 mmHg (ref 80.0–100.0)
PO2 ART: 72.5 mmHg — AB (ref 80.0–100.0)
Patient temperature: 99.1
TCO2: 19.2 mmol/L (ref 0–100)
TCO2: 20.2 mmol/L (ref 0–100)
pCO2 arterial: 34.3 mmHg — ABNORMAL LOW (ref 35.0–45.0)
pH, Arterial: 7.381 (ref 7.350–7.450)

## 2014-01-01 LAB — COMPREHENSIVE METABOLIC PANEL
ALT: 25 U/L (ref 0–53)
ANION GAP: 15 (ref 5–15)
AST: 51 U/L — ABNORMAL HIGH (ref 0–37)
Albumin: 4 g/dL (ref 3.5–5.2)
Alkaline Phosphatase: 83 U/L (ref 39–117)
BILIRUBIN TOTAL: 7.6 mg/dL — AB (ref 0.3–1.2)
BUN: 9 mg/dL (ref 6–23)
CALCIUM: 8.8 mg/dL (ref 8.4–10.5)
CHLORIDE: 104 meq/L (ref 96–112)
CO2: 19 meq/L (ref 19–32)
CREATININE: 0.83 mg/dL (ref 0.50–1.35)
GLUCOSE: 123 mg/dL — AB (ref 70–99)
Potassium: 3.8 mEq/L (ref 3.7–5.3)
Sodium: 138 mEq/L (ref 137–147)
Total Protein: 7.4 g/dL (ref 6.0–8.3)

## 2014-01-01 LAB — CBC WITH DIFFERENTIAL/PLATELET
BAND NEUTROPHILS: 0 % (ref 0–10)
BLASTS: 0 %
Basophils Absolute: 0 10*3/uL (ref 0.0–0.1)
Basophils Relative: 0 % (ref 0–1)
Eosinophils Absolute: 0.2 10*3/uL (ref 0.0–0.7)
Eosinophils Relative: 1 % (ref 0–5)
HCT: 19.3 % — ABNORMAL LOW (ref 39.0–52.0)
Hemoglobin: 7 g/dL — ABNORMAL LOW (ref 13.0–17.0)
LYMPHS ABS: 1.7 10*3/uL (ref 0.7–4.0)
LYMPHS PCT: 8 % — AB (ref 12–46)
MCH: 35.2 pg — ABNORMAL HIGH (ref 26.0–34.0)
MCHC: 36.3 g/dL — ABNORMAL HIGH (ref 30.0–36.0)
MCV: 97 fL (ref 78.0–100.0)
MONOS PCT: 14 % — AB (ref 3–12)
Metamyelocytes Relative: 0 %
Monocytes Absolute: 3 10*3/uL — ABNORMAL HIGH (ref 0.1–1.0)
Myelocytes: 0 %
NEUTROS ABS: 16.6 10*3/uL — AB (ref 1.7–7.7)
NEUTROS PCT: 77 % (ref 43–77)
NRBC: 3 /100{WBCs} — AB
PROMYELOCYTES ABS: 0 %
Platelets: 380 10*3/uL (ref 150–400)
RBC: 1.99 MIL/uL — ABNORMAL LOW (ref 4.22–5.81)
RDW: 23.5 % — AB (ref 11.5–15.5)
WBC: 21.5 10*3/uL — ABNORMAL HIGH (ref 4.0–10.5)

## 2014-01-01 LAB — RETICULOCYTES
RBC.: 2.04 MIL/uL — AB (ref 4.22–5.81)
RETIC COUNT ABSOLUTE: 438.6 10*3/uL — AB (ref 19.0–186.0)
Retic Ct Pct: 21.5 % — ABNORMAL HIGH (ref 0.4–3.1)

## 2014-01-01 LAB — I-STAT TROPONIN, ED: TROPONIN I, POC: 0.99 ng/mL — AB (ref 0.00–0.08)

## 2014-01-01 LAB — PRO B NATRIURETIC PEPTIDE: PRO B NATRI PEPTIDE: 5790 pg/mL — AB (ref 0–125)

## 2014-01-01 LAB — PREPARE RBC (CROSSMATCH)

## 2014-01-01 LAB — TROPONIN I
TROPONIN I: 7.81 ng/mL — AB (ref <0.30)
Troponin I: 2.57 ng/mL (ref <0.30)
Troponin I: 9.27 ng/mL (ref <0.30)

## 2014-01-01 LAB — MAGNESIUM: Magnesium: 1.8 mg/dL (ref 1.5–2.5)

## 2014-01-01 LAB — LACTATE DEHYDROGENASE: LDH: 680 U/L — ABNORMAL HIGH (ref 94–250)

## 2014-01-01 IMAGING — CR DG CHEST 1V PORT
1 series · 1 of 1 positions shown · non-contrast
Comparison: 02/07/2011

CLINICAL DATA: PICC line placement, sickle cell disease

PORTABLE CHEST - 1 VIEW

[AP]
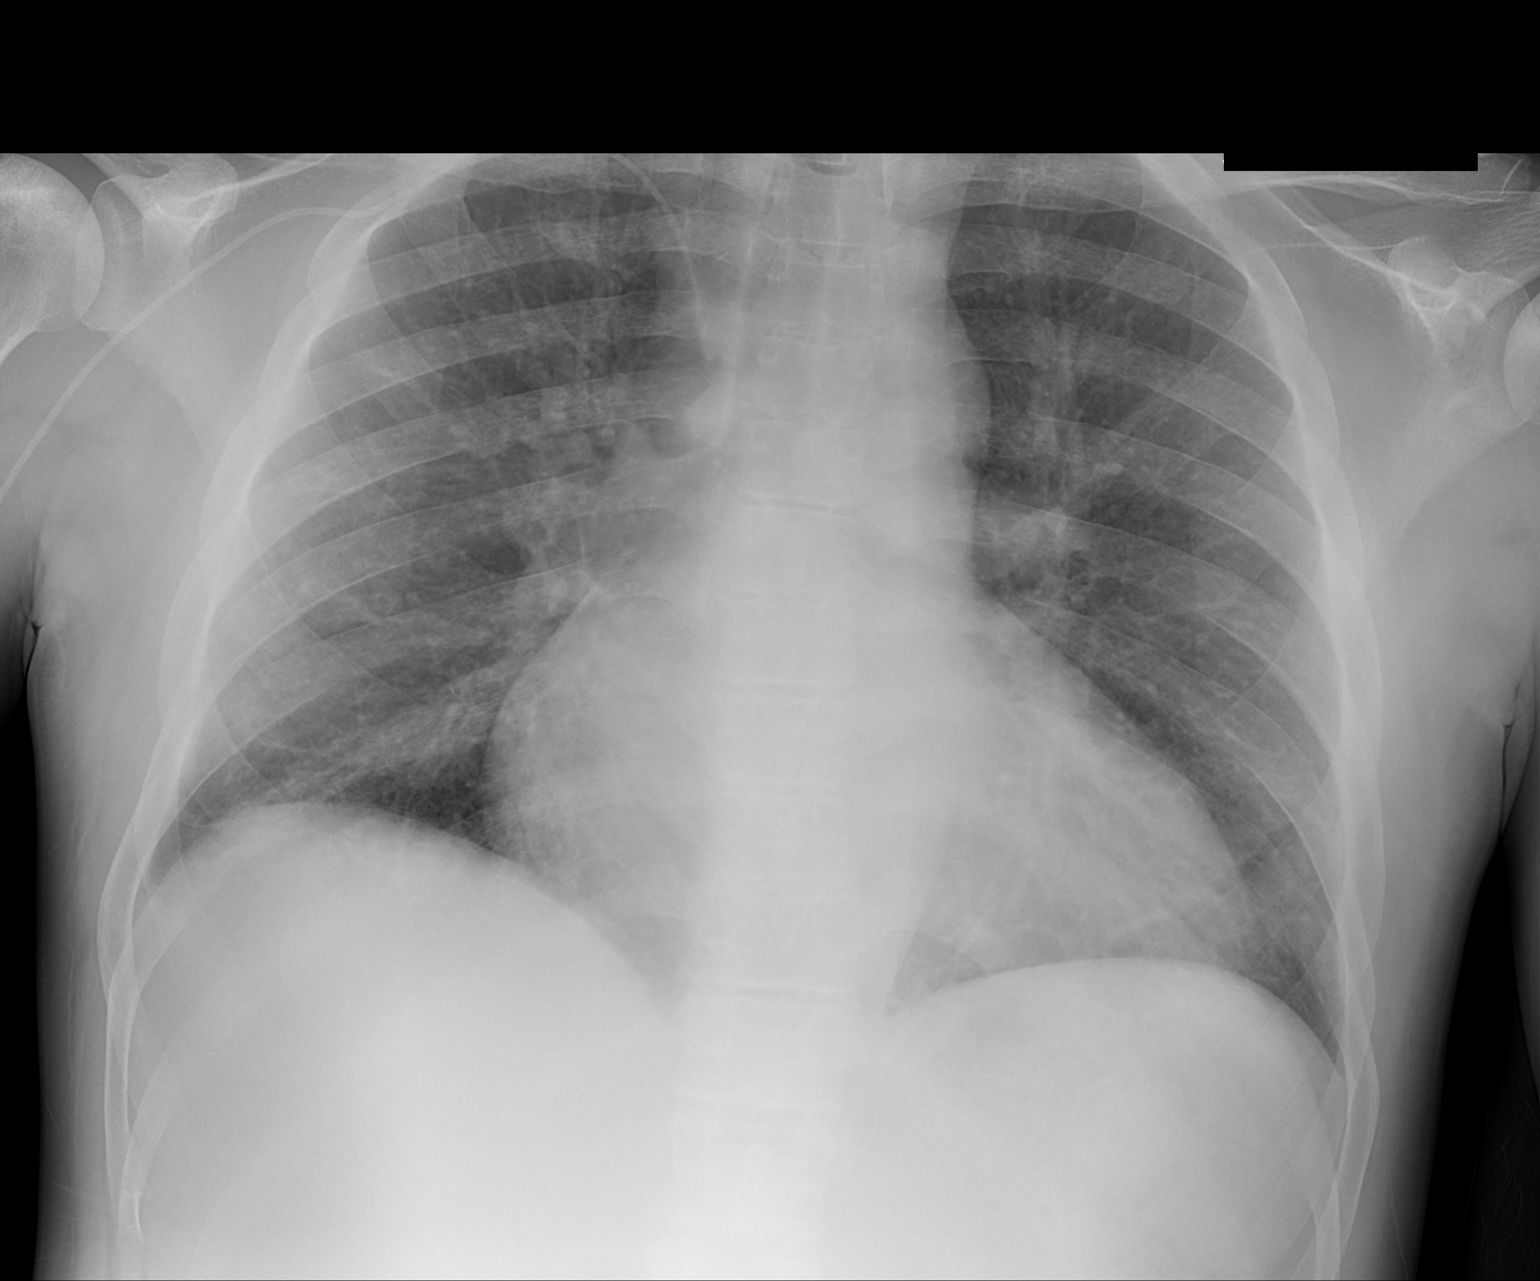

[1 of 1 positions shown; findings below may reference images not displayed]

FINDINGS: Right PICC line tip is at the innominate SVC junction.
This could be advanced 6 cm to the SVC RA junction.  Heart is
enlarged with vascular congestion and basilar atelectasis.  No
focal pneumonia, collapse, consolidation, effusion or pneumothorax.
Trachea midline.
IMPRESSION: Right PICC line tip at the innominate SVC junction, and could be
advanced 6 cm.

Cardiomegaly with vascular congestion

## 2014-01-01 MED ORDER — HYDROXYUREA 500 MG PO CAPS
1500.0000 mg | ORAL_CAPSULE | Freq: Every day | ORAL | Status: DC
  Filled 2014-01-01: qty 3

## 2014-01-01 MED ORDER — SODIUM CHLORIDE 0.9 % IV SOLN
25.0000 mg | INTRAVENOUS | Status: DC | PRN
  Administered 2014-01-02 – 2014-01-05 (×6): 25 mg via INTRAVENOUS
  Filled 2014-01-01 (×13): qty 0.5

## 2014-01-01 MED ORDER — LISINOPRIL 10 MG PO TABS
10.0000 mg | ORAL_TABLET | Freq: Every day | ORAL | Status: DC
  Administered 2014-01-01 – 2014-01-06 (×5): 10 mg via ORAL
  Filled 2014-01-01 (×5): qty 1

## 2014-01-01 MED ORDER — ZOLPIDEM TARTRATE 10 MG PO TABS: 10.0000 mg | ORAL_TABLET | Freq: Every evening | ORAL | Status: DC | PRN

## 2014-01-01 MED ORDER — NALOXONE HCL 0.4 MG/ML IJ SOLN: 0.4000 mg | INTRAMUSCULAR | Status: DC | PRN

## 2014-01-01 MED ORDER — IOHEXOL 350 MG/ML SOLN
100.0000 mL | Freq: Once | INTRAVENOUS | Status: AC | PRN
  Administered 2014-01-01: 100 mL via INTRAVENOUS

## 2014-01-01 MED ORDER — SODIUM CHLORIDE 0.9 % IJ SOLN
10.0000 mL | Freq: Two times a day (BID) | INTRAMUSCULAR | Status: DC
  Administered 2014-01-02 – 2014-01-04 (×2): 10 mL
  Administered 2014-01-05: 20 mL

## 2014-01-01 MED ORDER — HYDROMORPHONE HCL 1 MG/ML IJ SOLN
1.0000 mg | Freq: Once | INTRAMUSCULAR | Status: AC
  Administered 2014-01-01: 1 mg via INTRAVENOUS
  Filled 2014-01-01: qty 1

## 2014-01-01 MED ORDER — POTASSIUM CHLORIDE CRYS ER 20 MEQ PO TBCR
20.0000 meq | EXTENDED_RELEASE_TABLET | Freq: Every morning | ORAL | Status: DC
  Administered 2014-01-01 – 2014-01-06 (×6): 20 meq via ORAL
  Filled 2014-01-01 (×5): qty 1

## 2014-01-01 MED ORDER — DIPHENHYDRAMINE HCL 50 MG/ML IJ SOLN
25.0000 mg | Freq: Once | INTRAMUSCULAR | Status: AC
  Administered 2014-01-01: 25 mg via INTRAVENOUS
  Filled 2014-01-01: qty 1

## 2014-01-01 MED ORDER — HYDROMORPHONE HCL 2 MG/ML IJ SOLN
2.2000 mg | Freq: Once | INTRAMUSCULAR | Status: AC
  Administered 2014-01-01: 2.2 mg via INTRAVENOUS
  Filled 2014-01-01: qty 2

## 2014-01-01 MED ORDER — DEXTROSE-NACL 5-0.45 % IV SOLN
INTRAVENOUS | Status: DC
  Administered 2014-01-01: 13:00:00 via INTRAVENOUS

## 2014-01-01 MED ORDER — HYDROMORPHONE HCL 2 MG/ML IJ SOLN: 2.0000 mg | INTRAMUSCULAR | Status: DC | PRN

## 2014-01-01 MED ORDER — METOPROLOL TARTRATE 25 MG PO TABS
25.0000 mg | ORAL_TABLET | Freq: Every day | ORAL | Status: DC
  Administered 2014-01-01 – 2014-01-06 (×5): 25 mg via ORAL
  Filled 2014-01-01 (×5): qty 1

## 2014-01-01 MED ORDER — HYDROMORPHONE HCL 2 MG/ML IJ SOLN
1.8000 mg | Freq: Once | INTRAMUSCULAR | Status: AC
  Administered 2014-01-01: 1.8 mg via INTRAVENOUS
  Filled 2014-01-01: qty 1

## 2014-01-01 MED ORDER — SODIUM CHLORIDE 0.9 % IJ SOLN
10.0000 mL | INTRAMUSCULAR | Status: DC | PRN
Start: 2014-01-01 — End: 2014-01-06
  Administered 2014-01-01 – 2014-01-06 (×4): 10 mL
  Filled 2014-01-01 (×3): qty 40

## 2014-01-01 MED ORDER — MORPHINE SULFATE ER 30 MG PO TBCR
30.0000 mg | EXTENDED_RELEASE_TABLET | Freq: Two times a day (BID) | ORAL | Status: DC
  Administered 2014-01-01 – 2014-01-06 (×11): 30 mg via ORAL
  Filled 2014-01-01 (×8): qty 1
  Filled 2014-01-01: qty 2
  Filled 2014-01-01: qty 1

## 2014-01-01 MED ORDER — HYDROMORPHONE HCL 1 MG/ML IJ SOLN
1.0000 mg | INTRAMUSCULAR | Status: DC | PRN
  Administered 2014-01-01: 1 mg via SUBCUTANEOUS

## 2014-01-01 MED ORDER — FOLIC ACID 1 MG PO TABS
1.0000 mg | ORAL_TABLET | Freq: Every morning | ORAL | Status: DC
  Administered 2014-01-01 – 2014-01-06 (×6): 1 mg via ORAL
  Filled 2014-01-01 (×5): qty 1

## 2014-01-01 MED ORDER — SODIUM CHLORIDE 0.9 % IJ SOLN: 9.0000 mL | INTRAMUSCULAR | Status: DC | PRN

## 2014-01-01 MED ORDER — HYDROXYUREA 500 MG PO CAPS
1500.0000 mg | ORAL_CAPSULE | Freq: Every day | ORAL | Status: DC
  Administered 2014-01-03 – 2014-01-06 (×4): 1500 mg via ORAL
  Filled 2014-01-01 (×5): qty 3

## 2014-01-01 MED ORDER — DIPHENHYDRAMINE HCL 25 MG PO CAPS
25.0000 mg | ORAL_CAPSULE | Freq: Four times a day (QID) | ORAL | Status: DC | PRN
  Administered 2014-01-02: 50 mg via ORAL
  Administered 2014-01-02 (×2): 25 mg via ORAL
  Administered 2014-01-04: 50 mg via ORAL
  Filled 2014-01-01 (×2): qty 1
  Filled 2014-01-01: qty 2
  Filled 2014-01-01: qty 1
  Filled 2014-01-01: qty 2

## 2014-01-01 MED ORDER — HYDROMORPHONE HCL 2 MG/ML IJ SOLN
2.6000 mg | Freq: Once | INTRAMUSCULAR | Status: AC
  Administered 2014-01-01: 2.6 mg via INTRAVENOUS
  Filled 2014-01-01: qty 2

## 2014-01-01 MED ORDER — HYDROMORPHONE 2 MG/ML HIGH CONCENTRATION IV PCA SOLN
INTRAVENOUS | Status: DC
Start: 2014-01-01 — End: 2014-01-04
  Administered 2014-01-01: 0.7 mg via INTRAVENOUS
  Administered 2014-01-01: 7 mg via INTRAVENOUS
  Administered 2014-01-02: 4.9 mg via INTRAVENOUS
  Administered 2014-01-02: 0.7 mg via INTRAVENOUS
  Administered 2014-01-02: 7.7 mg via INTRAVENOUS
  Administered 2014-01-02: 5.6 mg via INTRAVENOUS
  Administered 2014-01-02 (×2): 0.7 mg via INTRAVENOUS
  Administered 2014-01-03: 6.3 mg via INTRAVENOUS
  Administered 2014-01-03: 08:00:00 via INTRAVENOUS
  Administered 2014-01-03: 4.9 mg via INTRAVENOUS
  Administered 2014-01-03: 2.8 mg via INTRAVENOUS
  Administered 2014-01-03: 7.7 mg via INTRAVENOUS
  Administered 2014-01-03: 8.4 mg via INTRAVENOUS
  Administered 2014-01-03: 7 mg via INTRAVENOUS
  Administered 2014-01-04: 10:00:00 via INTRAVENOUS
  Administered 2014-01-04 (×2): 6.3 mg via INTRAVENOUS
  Administered 2014-01-04: 5.6 mg via INTRAVENOUS
  Filled 2014-01-01 (×3): qty 25

## 2014-01-01 MED ORDER — POLYETHYLENE GLYCOL 3350 17 G PO PACK
17.0000 g | PACK | Freq: Every day | ORAL | Status: DC | PRN
  Filled 2014-01-01: qty 1

## 2014-01-01 MED ORDER — SODIUM CHLORIDE 0.9 % IJ SOLN: 10.0000 mL | INTRAMUSCULAR | Status: DC | PRN

## 2014-01-01 MED ORDER — PROMETHAZINE HCL 25 MG PO TABS: 25.0000 mg | ORAL_TABLET | Freq: Four times a day (QID) | ORAL | Status: DC | PRN

## 2014-01-01 MED ORDER — SODIUM CHLORIDE 0.9 % IJ SOLN
10.0000 mL | Freq: Two times a day (BID) | INTRAMUSCULAR | Status: DC
  Administered 2014-01-01: 10 mL

## 2014-01-01 MED ORDER — SODIUM CHLORIDE 0.9 % IV SOLN: Freq: Once | INTRAVENOUS | Status: DC

## 2014-01-01 MED ORDER — RIVAROXABAN 20 MG PO TABS
20.0000 mg | ORAL_TABLET | Freq: Every day | ORAL | Status: DC
  Administered 2014-01-01 – 2014-01-06 (×6): 20 mg via ORAL
  Filled 2014-01-01 (×7): qty 1

## 2014-01-01 MED ORDER — ZOLPIDEM TARTRATE 5 MG PO TABS
5.0000 mg | ORAL_TABLET | Freq: Every evening | ORAL | Status: DC | PRN
  Administered 2014-01-02 – 2014-01-05 (×3): 5 mg via ORAL
  Filled 2014-01-01 (×3): qty 1

## 2014-01-01 MED ORDER — HYDROMORPHONE HCL 1 MG/ML IJ SOLN
INTRAMUSCULAR | Status: AC
  Filled 2014-01-01: qty 1

## 2014-01-01 MED ORDER — ASPIRIN 81 MG PO CHEW
324.0000 mg | CHEWABLE_TABLET | Freq: Once | ORAL | Status: AC
  Administered 2014-01-01: 324 mg via ORAL
  Filled 2014-01-01: qty 4

## 2014-01-01 MED ORDER — ASPIRIN 81 MG PO CHEW
324.0000 mg | CHEWABLE_TABLET | Freq: Once | ORAL | Status: DC
  Filled 2014-01-01: qty 4

## 2014-01-01 MED ORDER — SODIUM CHLORIDE 0.9 % IJ SOLN
INTRAMUSCULAR | Status: AC
  Administered 2014-01-01: 10 mL
  Filled 2014-01-01: qty 10

## 2014-01-01 MED ORDER — HYDROMORPHONE HCL 2 MG/ML IJ SOLN
3.0000 mg | Freq: Once | INTRAMUSCULAR | Status: AC
Start: 2014-01-01 — End: 2014-01-01
  Administered 2014-01-01: 3 mg via INTRAVENOUS
  Filled 2014-01-01: qty 2

## 2014-01-01 MED ORDER — SENNOSIDES-DOCUSATE SODIUM 8.6-50 MG PO TABS
1.0000 | ORAL_TABLET | Freq: Two times a day (BID) | ORAL | Status: DC
  Administered 2014-01-01 – 2014-01-06 (×5): 1 via ORAL
  Filled 2014-01-01 (×12): qty 1

## 2014-01-01 MED ORDER — ONDANSETRON HCL 4 MG/2ML IJ SOLN
4.0000 mg | Freq: Four times a day (QID) | INTRAMUSCULAR | Status: DC | PRN
Start: 2014-01-01 — End: 2014-01-01

## 2014-01-01 NOTE — Progress Notes (Addendum)
Patient ID: [name], male   DOB: 1980/04/30, 33 y.o.   MRN: [id number] I reviewed patient's labs and noted that [name] had an escalating Troponin level. Nurse also reported that he was having an increasing Oxygen requirement and had to increase Oxygen to 6L.   I came by to see patient as I had seen him earlier today and although he has no increased work of breathing, he is now c/o of increaing SOB which is more than earlier today.   He does have a significantly elevated Pro-BNP (5790) compared to that of 1 year ago which was 106.3. I is unclear if this represents a new CHF or is related to Pulmonary HTN in light of the findings of Pulmonary Artery Dilatation and dilatation of right cardiac chambers seen on CT chest.   Focused examination: Vital Signs reviewed. General:He appears comfortable and in NAD  Neck: No JVD Lungs:  clear to auscultation. Cardiac: S1S2 normal,  no M/R/G and no displaced or increased PMI EXT: He has no pedal edema    Plan I have discussed this with NP Forrest Moron [facility] service : - Obtain ABG and she will  Follow up.  - Will transfer patient to SDU for closer observation given his escalating Oxygen needs.  - Will continue to cycle enzymes: Although he continues to have mildly elevated Troponins, I feel that this is a result of demand ischemia.   [name] A.  Addendum: Reviewed EKG which is essentially unchanged from this morning. He does have a prolonged QTc which is actually improved since this morning. Will recheck tomorrow. Will hold Zofran and use Phenergan instead in light of prolonged QTc. [name] A.

## 2014-01-01 NOTE — ED Notes (Signed)
Pager did not go off at all, saw bed at 28 min and called the floor and notified the ed nurse of 20 min to get patient to floor,,,klj

## 2014-01-01 NOTE — ED Notes (Signed)
Pt's port has stopped flushing.  PA notified and will consult IV team.

## 2014-01-01 NOTE — ED Notes (Addendum)
Pt placed on 4L [place].  O2 sat increased to 90%.

## 2014-01-01 NOTE — ED Notes (Signed)
Per EMS, Pt, from home, c/o generalized Sickle Cell pain x 1 day.  Pain score 8/10.

## 2014-01-01 NOTE — ED Notes (Signed)
Dr. Zigmund Daniel is here and is apprised of labs, including elevated troponin, by our P.A., Lattie Haw.

## 2014-01-01 NOTE — ED Notes (Signed)
Port is working w/o difficulty.

## 2014-01-01 NOTE — ED Notes (Signed)
Bed: WA13 Expected date:  Expected time:  Means of arrival:  Comments: EMS sickle cell pain 

## 2014-01-01 NOTE — H&P (Addendum)
Hospital Admission Note Date: 01/01/2014  Patient name: [name] Medical record number: [id number] Date of birth: Oct 29, 1980 Age: 33 y.o. Gender: male PCP: [name],[name], MD  Attending physician: [name], MD  Chief Complaint:: Chest pain and SOB.  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 He had been without Xarelto for about 1 week at the time and a PE was suspected. He was advised to be evaluated for a PE, however he left AMA stating that he had to get home.   He has continued to have chest wall pain and has continued to be without the Xarelto. He presented today with C/O chest wall pain which he states is very typical of his Sickle Cell Pain. He describes the pain as throbbing in nature and localized to the left chest wall. The pain is sharp and throbbing in nature. He reports that the pain began 3 days ago and he was treating with his usual oral analgesics. However the pain escalated during the night last night and was unmanageable with oral medication so he came to the ED. He denies fever, chills, DOE. He does have more jaundice than his usual baseline  In the ED,he was also found to be hypoxic and tachycardic. Further evaluation by CTA showed a small PE. He also had elevated troponins and Pro-BNP with some T-wave inversion noted on EKG.   Scheduled Meds: .  HYDROmorphone (DILAUDID) injection  2.2 mg Intravenous Once   Followed by  .  HYDROmorphone (DILAUDID) injection  2.6 mg Intravenous Once  . sodium chloride  10-40 mL Intracatheter Q12H   Continuous Infusions:  PRN Meds:.sodium chloride Allergies: Morphine and related Past Medical History  Diagnosis Date  . Sickle cell anemia   . Blood transfusion   . Acute embolism and thrombosis of right internal jugular vein   . Hypokalemia   . Mood disorder   . History of pulmonary embolus (PE)   . Avascular necrosis    . Leukocytosis     Chronic  . Thrombocytosis     Chronic  . Hypertension   . History of Clostridium difficile infection   . Uses marijuana   . Chronic anticoagulation   . Functional asplenia   . Former smoker   . Second hand tobacco smoke exposure   . Alcohol consumption of one to four drinks per day   . Noncompliance with medication regimen    Past Surgical History  Procedure Laterality Date  . Right hip replacement      08/2006  . Cholecystectomy      01/2008  . Porta cath placement    . Porta cath removal    . Umbilical hernia repair      01/2008  . Excision of left periauricular cyst      10/2009  . Excision of right ear lobe cyst with primary closur      11/2007  . Portacath placement  01/05/2012    Procedure: INSERTION PORT-A-CATH;  Surgeon: Odis Hollingshead, MD;  Location: [facility];  Service: General;  Laterality: N/A;  ultrasound guiced port a cath insertion with fluoroscopy   Family History  Problem Relation Age of Onset  . Sickle cell anemia Mother   . Sickle cell anemia Father   . Sickle cell trait Brother    History   Social History  . Marital Status: Single    Spouse Name:  N/A    Number of Children: 0  . Years of Education: 13   Occupational History  . 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 Smoking status: Former Smoker -- 13 years    Quit date: 07/08/2010  . Smokeless tobacco: Never Used  . Alcohol Use: No  . Drug Use: 2.00 per week    Special: Marijuana  . Sexual Activity:    Partners: Female    Museum/gallery curator: None     Comment: month ago   Other Topics Concern  . Not on file   Social History Narrative   Lives in an apartment.  Single.  Lives alone but has a girlfriend that helps care for him.  Does not use any assist devices.        [name]:  [phone] Mom, emergency contact   Review of Systems: A comprehensive review of systems was negative except as noted  in the HPI. Physical Exam:  Intake/Output Summary (Last 24 hours) at 01/01/14 1158 Last data filed at 01/01/14 0857  Gross per 24 hour  Intake     10 ml  Output      0 ml  Net     10 ml   General: Alert, awake, oriented x3, in no acute distress.  HEENT: [place]/AT PEERL, EOMI, mild increase in icterus Neck: Trachea midline,  no masses, no thyromegal,y no JVD, no carotid bruit OROPHARYNX:  Moist, No exudate/ erythema/lesions.  Heart: Regular rhythm but tachycardic. He is without murmurs, rubs, gallops, PMI non-displaced, no heaves or thrills on palpation.  Lungs: Clear to auscultation, no wheezing or rhonchi noted. No increased vocal fremitus resonant to percussion.   Abdomen: Soft, nontender, nondistended, positive bowel sounds, no masses no hepatosplenomegaly noted.  Neuro: No focal neurological deficits noted cranial nerves II through XII grossly intact. DTRs 2+ bilaterally upper and lower extremities. Strength functional in bilateral upper and lower extremities. Musculoskeletal: No warm swelling or erythema around joints, no spinal tenderness noted. Psychiatric: Patient alert and oriented x3, good insight and cognition, good recent to remote recall.   Lab results:  Recent Labs  01/01/14 0909  NA 138  K 3.8  CL 104  CO2 19  GLUCOSE 123*  BUN 9  CREATININE 0.83  CALCIUM 8.8    Recent Labs  01/01/14 0909  AST 51*  ALT 25  ALKPHOS 83  BILITOT 7.6*  PROT 7.4  ALBUMIN 4.0   No results for input(s): LIPASE, AMYLASE in the last 72 hours.  Recent Labs  01/01/14 0909  WBC 21.5*  NEUTROABS 16.6*  HGB 7.0*  HCT 19.3*  MCV 97.0  PLT 380    Recent Labs  01/01/14 0909  TROPONINI 2.57*   Invalid input(s): POCBNP No results for input(s): DDIMER in the last 72 hours. No results for input(s): HGBA1C in the last 72 hours. No results for input(s): CHOL, HDL, LDLCALC, TRIG, CHOLHDL, LDLDIRECT in the last 72 hours. No results for input(s): TSH, T4TOTAL, T3FREE, THYROIDAB  in the last 72 hours.  Invalid input(s): FREET3  Recent Labs  01/01/14 0911  RETICCTPCT 21.5*   Imaging results:  Dg Chest 2 View  12/23/2013   CLINICAL DATA:  Sickle cell crisis, rib pain  EXAM: CHEST  2 VIEW  COMPARISON:  12/22/2013  FINDINGS: LEFT subclavian power port with tip in SVC.  Enlargement of cardiac silhouette with pulmonary vascular congestion.  Moderate peribronchial thickening with accentuation  of perihilar markings.  Minimal RIGHT base atelectasis.  Minimal scarring RIGHT mid lung and LEFT lower lobe.  No acute infiltrate, pleural effusion or pneumothorax.  Bones unremarkable.  IMPRESSION: Enlargement of cardiac silhouette with pulmonary vascular congestion.  Moderate bronchitic changes with RIGHT basilar atelectasis and scattered scarring.  No acute abnormalities.   Electronically Signed   By: Lavonia Dana M.D.   On: 12/23/2013 13:02   Dg Chest 2 View  12/22/2013   CLINICAL DATA:  Fever, weakness and body aches. History of sickle cell anemia.  EXAM: CHEST - 2 VIEW  COMPARISON:  10/30/2013  FINDINGS: Stable appearance of left-sided port with catheter tip at the cavoatrial junction. Stable appearance of pulmonary scarring bilaterally. There is no evidence of pulmonary edema, consolidation, pneumothorax, nodule or pleural fluid. The heart size is stable and at the upper limits of normal. The visualized bony thorax is unremarkable.  IMPRESSION: No active disease.  Stable pulmonary scarring.   Electronically Signed   By: Aletta Edouard M.D.   On: 12/22/2013 12:27   Ct Angio Chest Pe W/cm &/or Wo Cm  01/01/2014   CLINICAL DATA:  Bilateral chest pain for 1 day. History of sickle cell anemia. Initial encounter.  EXAM: CT ANGIOGRAPHY CHEST WITH CONTRAST  TECHNIQUE: Multidetector CT imaging of the chest was performed using the standard protocol during bolus administration of intravenous contrast. Multiplanar CT image reconstructions and MIPs were obtained to evaluate the vascular anatomy.   CONTRAST:  173mL OMNIPAQUE IOHEXOL 350 MG/ML SOLN  COMPARISON:  Radiographs today.  Chest CT 11/25/2013.  FINDINGS: Vascular: The pulmonary arteries are well opacified with contrast. There is a tiny nonobstructing filling defect within a left upper lobe a pulmonary arterial subsegmental branch. This is best seen on thin section axial images 91-98 and appears new. No other pulmonary emboli are demonstrated. The central pulmonary arteries appear widely patent. There is stable dilatation the pulmonary arteries and the right cardiac chambers, but no signs of acute right heart strain. No significant atherosclerosis demonstrated.Cardiomegaly appears stable.  Mediastinum: There is stable nodularity within the anterior mediastinum consistent with residual thymic tissue. Small mediastinal and hilar lymph nodes are stable. The thyroid gland, trachea and esophagus demonstrate no significant findings. The left subclavian Port-A-Cath appears unchanged.  Lungs/Pleura: There is no pleural or pericardial effusion.There is stable chronic lung disease with basilar scarring and chronic interstitial opacities. No superimposed airspace disease or suspicious pulmonary nodule.  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 No acute findings identified.  Musculoskeletal/Chest wall: No chest wall lesion or acute osseous findings.Chronic osseous changes of sickle cell in both humeral heads an the thoracic spine are stable.  Review of the MIP images confirms the above findings.  IMPRESSION: 1. Tiny focus of nonocclusive thromboembolic disease in the left upper lobe. No central pulmonary emboli demonstrated. 2. Stable chronic dilatation of the pulmonary arteries and right cardiac chambers consistent with pulmonary arterial hypertension. No evidence of acute right heart strain. 3. Stable chronic lung disease. 4. 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 5. Critical Value/emergent  results were called by telephone at the time of interpretation on 01/01/2014 at 10:46 am to Dr. Thurnell Garbe, who verbally acknowledged these results.   Electronically Signed   By: Camie Patience M.D.   On: 01/01/2014 10:47   Dg Chest Port 1 View  01/01/2014   CLINICAL DATA:  Shortness of breath with hypoxia.  Sickle cell pain.  EXAM: PORTABLE CHEST - 1 VIEW  COMPARISON:  Radiographs 12/23/2013.  CT 11/25/2013.  FINDINGS: 0035 hr. The left subclavian Port-A-Cath is unchanged at the SVC right atrial junction. Cardiomegaly and chronic vascular congestion are unchanged. There is minimally increased basilar atelectasis compared with the most recent radiographs. There is no overt pulmonary edema, confluent airspace opacity or pleural effusion. Left humeral head avascular necrosis noted.  IMPRESSION: Stable cardiomegaly and chronic vascular congestion. Minimally increased bibasilar atelectasis without focal airspace disease.   Electronically Signed   By: Camie Patience M.D.   On: 01/01/2014 08:47   Other results: EKG: normal EKG, normal sinus rhythm, unchanged from previous tracings, ischemic changes noted in V1-V5 which are chronic.   Assessment and Plan: 1. Hypoxemia: Likely secondary to PE and demand ischemia. Will support with oxygen.  2. Pulmonary Embolus: He developed embolus while off Xarelto. I will resume Xarelto at 20 mg daily. I will also obtain hemoglobin electrophoresis. If oxygen requirement increases may need to transfuse to decrease % of circulating Hb S.   3. Elevated Troponin: I suspect demand ischemia. Will cycle enzymes and monitor for signs of  worsening ischemia. He had an ECHO in 05/2013 which showed normal EF. However in light of his markedly elevated Pro-BNP. Will obtain 2-D ECHO. I have also consulted Cardiology for their recommendations.  4. Tachycardia: Likely multifactorial. I suspect more related to his pain than a cardiac condition.  5. Hb SS with crisis: I will rapid re-dose with  Dilaudid. Continue MS Contin and add Toradol.Pt will also be started on a weight based PCA if pain persists. Pt has elevated BNP whic is a poor prognostic factor in Hb SS disease.  Total time spent 1 hour  [name],[name] 01/01/2014, 11:58 AM

## 2014-01-01 NOTE — Progress Notes (Addendum)
This NP asked by attending, Dr. Zigmund Daniel to follow pt secondary to transfer to SDU. Pt was placed on 6L O2 per [place] on nsg floor due to SaO2 of 90%. The daytime attending was on the nsg floor at that time and assessed pt-see her note. Pt transferred to SDU and this NP saw him on arrival. He is very sleepy. O2 sats are over 92% now. No increased WOB or respiratory distress noted. RT did ABG which looked fine except for slightly low PO2. Pt placed on venti mask. Other VSS. Pt is denying SOB at present.  This NP reviewed CXR and CT chest which show stable cardiomegaly without pulmonary edema with chronic changes of sickle cell disease. CTA showed recurrent small PE but pt has chronic PE and is already on anticoag therapy.  In re: elevated troponins, this NP called cardiology fellow who said this is likely demand ischemia from severe anemia, sickle cell, and PE. This NP relayed the elevated pro-BNP and given pt doesn't appear overloaded and CXR showed no pulmonary edema today, Dr. Claiborne Billings is not presently concerned.  The pt is receiving 2 U Transfusion now and cardio said if he appeared overloaded could give 20-40mg  of Lasix.  Will get H/H after transfusion.  Use pain meds judiciously tonight as may be contributing to his hypoxia.  Clance Boll, NP Update: To bedside again. Pt restless c/o back and rib cage pain. More alert. O2 sat 93-95%on venti mask. No increased WOB noted. Pt is getting his 2nd unit of blood now. Tolerating well. Has only one IV site at present, so can not receive IV Dilaudid at present. Has order for subq Dilaudid. Given earlier hypoxia, will limit dilaudid to 1mg  for now. Also, just received MS Contin. RNs looking for 2nd IV site. BP and HR stable. KJKG, NP

## 2014-01-01 NOTE — ED Notes (Signed)
IV team at bedside 

## 2014-01-01 NOTE — Progress Notes (Signed)
RN paged this NP with elevated troponin, slightly higher than previous one. This NP reviewed EKG which is unchanged from previous one today. Cardiology has already consulted and said that elevated troponin was likely atttributed to recurrent small PE, sickle cell, and severe anemia. Pt on Xeralto and cardio note says no further cardio intervention at this time. This NP called RN back and Dr. Zigmund Daniel, the attending, was present on floor and is moving pt to SDU and calling cardio to see if new or further orders are needed.  Clance Boll, NP [facility]

## 2014-01-01 NOTE — Consult Note (Signed)
CARDIOLOGY CONSULT NOTE   Patient ID: DAKARI CREGGER MRN: [id number], DOB/AGE: Jul 01, 1980   Admit date: 01/01/2014 Date of Consult: 01/01/2014   Primary Physician: [name]., MD Primary Cardiologist: Seen by Dr. Stanford Breed as consult in May 2015  Pt. Profile  33 year old Afro-American male with past medical history of sickle cell disease, history of PE, hypertension, occasional marijuana use, or history of EtOH abuse, and history of medical noncompliance presented with sickle cell crisis and hypoxia, found to have small PE and anemia with hgb 7.0 and elevated troponin  Problem List  Past Medical History  Diagnosis Date  . Sickle cell anemia   . Blood transfusion   . Acute embolism and thrombosis of right internal jugular vein   . Hypokalemia   . Mood disorder   . History of pulmonary embolus (PE)   . Avascular necrosis   . Leukocytosis     Chronic  . Thrombocytosis     Chronic  . Hypertension   . History of Clostridium difficile infection   . Uses marijuana   . Chronic anticoagulation   . Functional asplenia   . Former smoker   . Second hand tobacco smoke exposure   . Alcohol consumption of one to four drinks per day   . Noncompliance with medication regimen     Past Surgical History  Procedure Laterality Date  . Right hip replacement      08/2006  . Cholecystectomy      01/2008  . Porta cath placement    . Porta cath removal    . Umbilical hernia repair      01/2008  . Excision of left periauricular cyst      10/2009  . Excision of right ear lobe cyst with primary closur      11/2007  . Portacath placement  01/05/2012    Procedure: INSERTION PORT-A-CATH;  Surgeon: Odis Hollingshead, MD;  Location: [place];  Service: General;  Laterality: N/A;  ultrasound guiced port a cath insertion with fluoroscopy     Allergies  Allergies  Allergen Reactions  . Morphine And Related Hives and Rash    Pt states he can take PO, but not IV and he is able to  tolerate Dilaudid with no reactions.    HPI   The patient is a young 33 year old Afro-American male with past medical history of sickle cell disease, history of PE, hypertension, occasional marijuana use, or history of EtOH abuse, and history of medical noncompliance. She is last Myoview was on 12/25/2009 which showed EF 62%, no inducible myocardial ischemia. He was diagnosed with pulmonary embolism last year and was placed on Xarelto. Patient had multiple CTA of the chest since last year which showed no recurrence of pulmonary embolism. He also had several echocardiograms from last year and this year. The last echocardiogram on 06/03/2013 showed EF 55-60%, no regional wall motion abnormality, mildly dilated by atrial size, moderate tricuspid regurg, significant pulmonary artery pressure at 80 mmHg. He run out of his Xarelto over a week ago, he has been left with no medication. He had several prior history of sickle cell crisis. He was admitted on 12/23/2013 for sickle cell crisis with hypoxia. However prior to scheduled CTA of the chest to further assess his hypoxia, patient left AMA.   Since his discharge, patient continued to have bilateral rib pain which has been worsening. He denies any recent trigger include fever, chill or sign of infection. There has been no LE edema, orthopnea, PND or  exertional CP prior to symptom onset. Patient finally decided to seek medical attention at [facility] ED on 01/01/2014. On arrival, he was noted to be hypoxic mainly in the 80s. Heart rate was mildly elevated in the 90s to 100s. Initial blood pressure was 126/78. Significant laboratory finding include creatinine of 0.83, initial point-of-care troponin was 0.99, proBNP 5790, hemoglobin 7.0. Chest x-ray shows stable cardiomegaly and chronic vascular congestion, no focal airspace disease. EKG showed a mildly sinus tachycardic with T-wave inversion in V1 through V5 which appears to be chronic when compared to the previous  EKG. Patient was admitted to the internal medicine service, serial troponin shows troponin trending up to 7.81. Given his elevated d-dimer and his hypoxia, CTA of the chest was repeated which showed small left upper lobe pulmonary emboli. Cardiology has been consulted for elevated troponin.    Inpatient Medications  . aspirin  324 mg Oral Once  . folic acid  1 mg Oral q morning - 10a  . HYDROmorphone PCA 2 mg/mL   Intravenous 6 times per day  .  HYDROmorphone (DILAUDID) injection  2.6 mg Intravenous Once  . hydroxyurea  1,500 mg Oral Daily  . lisinopril  10 mg Oral Daily  . metoprolol tartrate  25 mg Oral Daily  . morphine  30 mg Oral Q12H  . potassium chloride SA  20 mEq Oral q morning - 10a  . rivaroxaban  20 mg Oral q morning - 10a  . senna-docusate  1 tablet Oral BID    Family History Family History  Problem Relation Age of Onset  . Sickle cell anemia Mother   . Sickle cell anemia Father   . Sickle cell trait Brother      Social History History   Social History  . Marital Status: Single    Spouse Name: N/A    Number of Children: 0  . Years of Education: 13   Occupational History  . 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 Smoking status: Former Smoker -- 13 years    Quit date: 07/08/2010  . Smokeless tobacco: Never Used  . Alcohol Use: No  . Drug Use: 2.00 per week    Special: Marijuana  . Sexual Activity:    Partners: Female    Museum/gallery curator: None     Comment: month ago   Other Topics Concern  . Not on file   Social History Narrative   Lives in an apartment.  Single.  Lives alone but has a girlfriend that helps care for him.  Does not use any assist devices.        [name]:  [phone] Mom, emergency contact     Review of Systems  General:  No chills, fever, night sweats or weight changes. +no obvious sign of recent infection Cardiovascular:  No chest pain, dyspnea on  exertion, edema, orthopnea, palpitations, paroxysmal nocturnal dyspnea. Dermatological: No rash, lesions/masses +bilateral rib in L and R flank. No anterior CP pain.  Respiratory: No cough, dyspnea Urologic: No hematuria, dysuria Abdominal:   No nausea, vomiting, diarrhea, bright red blood per rectum, melena, or hematemesis Neurologic:  No visual changes, wkns, changes in mental status. All other systems reviewed and are otherwise negative except as noted above.  Physical Exam  Blood pressure 122/74, pulse 75, temperature 98.9 F (37.2 C), temperature source Oral, resp. rate 14, SpO2 87 %.  General: Appears to be uncomfortable, writhing  in bed Psych: Normal affect. Neuro: Alert and oriented X 3. Moves all extremities spontaneously. HEENT: Normal  Neck: Supple without bruits or JVD. Lungs:  Resp regular and unlabored, CTA. Heart: RRR no s3, s4, or murmurs. Abdomen: Soft, non-tender, non-distended, BS + x 4.  Extremities: No clubbing, cyanosis or edema. DP/PT/Radials 2+ and equal bilaterally.  Labs   Recent Labs  01/01/14 0909 01/01/14 1207  TROPONINI 2.57* 7.81*   Lab Results  Component Value Date   WBC 21.5* 01/01/2014   HGB 7.0* 01/01/2014   HCT 19.3* 01/01/2014   MCV 97.0 01/01/2014   PLT 380 01/01/2014     Recent Labs Lab 01/01/14 0909  NA 138  K 3.8  CL 104  CO2 19  BUN 9  CREATININE 0.83  CALCIUM 8.8  PROT 7.4  BILITOT 7.6*  ALKPHOS 83  ALT 25  AST 51*  GLUCOSE 123*   Lab Results  Component Value Date   CHOL 63 06/04/2013   HDL 24* 06/04/2013   LDLCALC 26 06/04/2013   TRIG 63 06/04/2013   Lab Results  Component Value Date   DDIMER 0.66* 12/24/2013    Radiology/Studies  Dg Chest 2 View  12/23/2013   CLINICAL DATA:  Sickle cell crisis, rib pain  EXAM: CHEST  2 VIEW  COMPARISON:  12/22/2013  FINDINGS: LEFT subclavian power port with tip in SVC.  Enlargement of cardiac silhouette with pulmonary vascular congestion.  Moderate peribronchial  thickening with accentuation of perihilar markings.  Minimal RIGHT base atelectasis.  Minimal scarring RIGHT mid lung and LEFT lower lobe.  No acute infiltrate, pleural effusion or pneumothorax.  Bones unremarkable.  IMPRESSION: Enlargement of cardiac silhouette with pulmonary vascular congestion.  Moderate bronchitic changes with RIGHT basilar atelectasis and scattered scarring.  No acute abnormalities.   Electronically Signed   By: Lavonia Dana M.D.   On: 12/23/2013 13:02   Dg Chest 2 View  12/22/2013   CLINICAL DATA:  Fever, weakness and body aches. History of sickle cell anemia.  EXAM: CHEST - 2 VIEW  COMPARISON:  10/30/2013  FINDINGS: Stable appearance of left-sided port with catheter tip at the cavoatrial junction. Stable appearance of pulmonary scarring bilaterally. There is no evidence of pulmonary edema, consolidation, pneumothorax, nodule or pleural fluid. The heart size is stable and at the upper limits of normal. The visualized bony thorax is unremarkable.  IMPRESSION: No active disease.  Stable pulmonary scarring.   Electronically Signed   By: Aletta Edouard M.D.   On: 12/22/2013 12:27   Ct Angio Chest Pe W/cm &/or Wo Cm  01/01/2014   CLINICAL DATA:  Bilateral chest pain for 1 day. History of sickle cell anemia. Initial encounter.  EXAM: CT ANGIOGRAPHY CHEST WITH CONTRAST  TECHNIQUE: Multidetector CT imaging of the chest was performed using the standard protocol during bolus administration of intravenous contrast. Multiplanar CT image reconstructions and MIPs were obtained to evaluate the vascular anatomy.  CONTRAST:  133mL OMNIPAQUE IOHEXOL 350 MG/ML SOLN  COMPARISON:  Radiographs today.  Chest CT 11/25/2013.  FINDINGS: Vascular: The pulmonary arteries are well opacified with contrast. There is a tiny nonobstructing filling defect within a left upper lobe a pulmonary arterial subsegmental branch. This is best seen on thin section axial images 91-98 and appears new. No other pulmonary emboli are  demonstrated. The central pulmonary arteries appear widely patent. There is stable dilatation the pulmonary arteries and the right cardiac chambers, but no signs of acute right heart strain. No significant atherosclerosis demonstrated.Cardiomegaly appears stable.  Mediastinum: There is stable nodularity within the anterior mediastinum consistent with residual thymic tissue. Small mediastinal and hilar lymph nodes are stable. The thyroid gland, trachea and esophagus demonstrate no significant findings. The left subclavian Port-A-Cath appears unchanged.  Lungs/Pleura: There is no pleural or pericardial effusion.There is stable chronic lung disease with basilar scarring and chronic interstitial opacities. No superimposed airspace disease or suspicious pulmonary nodule.  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 No acute findings identified.  Musculoskeletal/Chest wall: No chest wall lesion or acute osseous findings.Chronic osseous changes of sickle cell in both humeral heads an the thoracic spine are stable.  Review of the MIP images confirms the above findings.  IMPRESSION: 1. Tiny focus of nonocclusive thromboembolic disease in the left upper lobe. No central pulmonary emboli demonstrated. 2. Stable chronic dilatation of the pulmonary arteries and right cardiac chambers consistent with pulmonary arterial hypertension. No evidence of acute right heart strain. 3. Stable chronic lung disease. 4. 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 5. Critical Value/emergent results were called by telephone at the time of interpretation on 01/01/2014 at 10:46 am to Dr. Thurnell Garbe, who verbally acknowledged these results.   Electronically Signed   By: Camie Patience M.D.   On: 01/01/2014 10:47   Dg Chest Port 1 View  01/01/2014   CLINICAL DATA:  Shortness of breath with hypoxia.  Sickle cell pain.  EXAM: PORTABLE CHEST - 1 VIEW  COMPARISON:  Radiographs 12/23/2013.  CT  11/25/2013.  FINDINGS: 0035 hr. The left subclavian Port-A-Cath is unchanged at the SVC right atrial junction. Cardiomegaly and chronic vascular congestion are unchanged. There is minimally increased basilar atelectasis compared with the most recent radiographs. There is no overt pulmonary edema, confluent airspace opacity or pleural effusion. Left humeral head avascular necrosis noted.  IMPRESSION: Stable cardiomegaly and chronic vascular congestion. Minimally increased bibasilar atelectasis without focal airspace disease.   Electronically Signed   By: Camie Patience M.D.   On: 01/01/2014 08:47    ECG  Sinus tach with HR 90s, TWI in V1-V5 which appears to be chronic  ASSESSMENT AND PLAN  1. Elevated trop in the setting of hypoxia, PE, sickle cell crisis and severe anemia  - likely demand ischemia. Although he did have multiple echo in the past with most recent echo in May shows normal EF with no wall motion abnormality, we will recent echo to assess wall motion  - would not pursue aggressive invasive workup unless significantly abnormal echo, not a great candidate with noncompliance  2. LUL PE: appears to be small, continue Xarelto. No ASA  - h/o PE in July 2014  3. Sickle cell crisis: focus on pain control and fluid hydration 4. Severe anemia: 2/2 sickle cell crisis 5. Hypoxia: related to #2 and #3 6. Hypertension: continue BB and ACEI 7. Pulm HTN with PA peak pressure 26mmHg on recent echo 8. occasional marijuana use: last use 1 wk ago  9. history of EtOH abuse: last heavy drinking several month ago 10. history of medical noncompliance   Signed, Almyra Deforest, PA-C 01/01/2014, 3:15 PM Patient seen and examined and history reviewed. Agree with above findings and plan. 33 yo BM with recurrent sickle cell crises, history of PE and severe pulmonary HTN presents with acute rib pain (lateral and posterior). He is severely anemic and hypoxic. CT of the chest suggests recurrent small pulmonary  emboli. With this he has elevated troponins. This is a recurrent pattern from prior admissions. Ecg  shows chronic RBBB and anterior T wave abnormality. He had normal Myoview study in 2011. Clearly his troponin elevation is due to demand ischemia with sickle cell crisis, anemia, PE, and hypoxia. His risk of coronary disease is low. I would continue prior ACEi and beta blocker. Resume Xarelto. I would not treat with ASA while on Xarelto. Need to treat underlying problems with hydration, pain control, etc. I don't recommend further ischemic work up at this time.  Noah Lembke [place], [place] 01/01/2014 3:21 PM

## 2014-01-01 NOTE — ED Provider Notes (Signed)
CSN: [id number]     Arrival date & time 01/01/14  4818 History   First MD Initiated Contact with Patient 01/01/14 [phone]     Chief Complaint  Patient presents with  . Sickle Cell Pain Crisis     (Consider location/radiation/quality/duration/timing/severity/associated sxs/prior Treatment) Patient is a 33 y.o. male presenting with sickle cell pain. The history is provided by the patient and medical records.  Sickle Cell Pain Crisis Associated symptoms: chest pain and shortness of breath     This is a 33 year old male with past medical history significant for hypertension, sickle cell anemia, pulmonary embolus currently on xarelto, presenting to the ED for chest pain and shortness of breath.  Patient states symptoms have been ongoing for the past 24 hours. States pain is mostly localized to his bilateral rib cage with some radiation to his back. No pain relief with his home meds, Dilaudid and MS Contin.   Patient was recently admitted on 12/23/13 for sick cell crisis with hypoxia, Dr. Zigmund Daniel had planned for CTA of chest for further evaluation of his hypoxia, however patient signed out [place].  He does admit he has been non-compliant with his xarelto, missed approx 1 week of meds.  He denies any fever, cough, sweats, chills, nausea, vomiting, diarrhea.  With EMS, O2 sats 88% when transferred to ED bed, O2 sats 76% on RA.  Patient is not currently on home O2.  Past Medical History  Diagnosis Date  . Sickle cell anemia   . Blood transfusion   . Acute embolism and thrombosis of right internal jugular vein   . Hypokalemia   . Mood disorder   . History of pulmonary embolus (PE)   . Avascular necrosis   . Leukocytosis     Chronic  . Thrombocytosis     Chronic  . Hypertension   . History of Clostridium difficile infection   . Uses marijuana   . Chronic anticoagulation   . Functional asplenia   . Former smoker   . Second hand tobacco smoke exposure   . Alcohol consumption of one to four drinks  per day   . Noncompliance with medication regimen    Past Surgical History  Procedure Laterality Date  . Right hip replacement      08/2006  . Cholecystectomy      01/2008  . Porta cath placement    . Porta cath removal    . Umbilical hernia repair      01/2008  . Excision of left periauricular cyst      10/2009  . Excision of right ear lobe cyst with primary closur      11/2007  . Portacath placement  01/05/2012    Procedure: INSERTION PORT-A-CATH;  Surgeon: Odis Hollingshead, MD;  Location: [facility];  Service: General;  Laterality: N/A;  ultrasound guiced port a cath insertion with fluoroscopy   Family History  Problem Relation Age of Onset  . Sickle cell anemia Mother   . Sickle cell anemia Father   . Sickle cell trait Brother    History  Substance Use Topics  . Smoking status: Former Smoker -- 13 years    Quit date: 07/08/2010  . Smokeless tobacco: Never Used  . Alcohol Use: No    Review of Systems  Respiratory: Positive for shortness of breath.   Cardiovascular: Positive for chest pain.  All other systems reviewed and are negative.     Allergies  Morphine and related  Home Medications   Prior to Admission medications  Medication Sig Start Date End Date Taking? Authorizing Provider  folic acid (FOLVITE) 1 MG tablet Take 1 tablet (1 mg total) by mouth every morning. 10/08/13   [name], MD  HYDROmorphone (DILAUDID) 4 MG tablet Take 1 tablet (4 mg total) by mouth every 4 (four) hours as needed for severe pain. 12/24/13   [name], MD  hydroxyurea (HYDREA) 500 MG capsule Take 3 capsules (1,500 mg total) by mouth daily. May take with food to minimize GI side effects. 10/30/13   [name], MD  levofloxacin (LEVAQUIN) 750 MG tablet Take 1 tablet (750 mg total) by mouth daily. Patient not taking: Reported on 12/22/2013 11/28/13   Elwyn Reach, MD  lisinopril (PRINIVIL,ZESTRIL) 10 MG tablet Take 1 tablet (10 mg total) by mouth daily.  11/19/13   [name], MD  metoprolol tartrate (LOPRESSOR) 25 MG tablet Take 1 tablet (25 mg total) by mouth daily. 08/22/13   [name], MD  morphine (MS CONTIN) 30 MG 12 hr tablet Take 1 tablet (30 mg total) by mouth every 12 (twelve) hours. Patient not taking: Reported on 12/23/2013 12/12/13   [name], MD  potassium chloride SA (K-DUR,KLOR-CON) 20 MEQ tablet Take 1 tablet (20 mEq total) by mouth every morning. 09/25/13   [name], MD  rivaroxaban (XARELTO) 20 MG TABS tablet Take 1 tablet (20 mg total) by mouth every morning. Patient not taking: Reported on 12/23/2013 10/24/13   Dorena Dew, FNP  zolpidem (AMBIEN) 10 MG tablet Take 1 tablet (10 mg total) by mouth at bedtime as needed for sleep. 10/28/13   Dorena Dew, FNP   BP 126/78 mmHg  Pulse 106  Temp(Src) 98.9 F (37.2 C) (Oral)  Resp 24  SpO2 76%   Physical Exam  Constitutional: He is oriented to person, place, and time. He appears well-developed and well-nourished.  HENT:  Head: Normocephalic and atraumatic.  Mouth/Throat: Oropharynx is clear and moist.  Eyes: Conjunctivae and EOM are normal. Pupils are equal, round, and reactive to light.  Neck: Normal range of motion.  Cardiovascular: Normal rate, regular rhythm and normal heart sounds.   Pulmonary/Chest: Effort normal and breath sounds normal. Tachypnea noted. No respiratory distress. He has no wheezes. He has no rhonchi. He has no rales.  Respirations unlabored, mild tachypnea, lungs clear bilaterally, O2 sats currently 89% on 4 L via [place]  Abdominal: Soft. Bowel sounds are normal.  Musculoskeletal: Normal range of motion.  Neurological: He is alert and oriented to person, place, and time.  Skin: Skin is warm and dry.  Psychiatric: He has a normal mood and affect.  Nursing note and vitals reviewed.   ED Course  Procedures (including critical care time)  CRITICAL CARE Performed by: Larene Pickett   Total critical care  time: 45  Critical care time was exclusive of separately billable procedures and treating other patients.  Critical care was necessary to treat or prevent imminent or life-threatening deterioration.  Critical care was time spent personally by me on the following activities: development of treatment plan with patient and/or surrogate as well as nursing, discussions with consultants, evaluation of patient's response to treatment, examination of patient, obtaining history from patient or surrogate, ordering and performing treatments and interventions, ordering and review of laboratory studies, ordering and review of radiographic studies, pulse oximetry and re-evaluation of patient's condition.  Medications  HYDROmorphone (DILAUDID) injection 1.8 mg (1.8 mg Intravenous Given 01/01/14 1152)    Followed by  HYDROmorphone (DILAUDID) injection 2.2 mg (not administered)    Followed by  HYDROmorphone (DILAUDID) injection 2.6 mg (not administered)  aspirin chewable tablet 324 mg (not administered)  morphine (MS CONTIN) 12 hr tablet 30 mg (not administered)  lisinopril (PRINIVIL,ZESTRIL) tablet 10 mg (not administered)  hydroxyurea (HYDREA) capsule 1,500 mg (not administered)  zolpidem (AMBIEN) tablet 10 mg (not administered)  rivaroxaban (XARELTO) tablet 20 mg (not administered)  folic acid (FOLVITE) tablet 1 mg (not administered)  potassium chloride SA (K-DUR,KLOR-CON) CR tablet 20 mEq (not administered)  metoprolol tartrate (LOPRESSOR) tablet 25 mg (not administered)  naloxone (NARCAN) injection 0.4 mg (not administered)    And  sodium chloride 0.9 % injection 9 mL (not administered)  ondansetron (ZOFRAN) injection 4 mg (not administered)  HYDROmorphone (DILAUDID) 2 mg/ml High Concentration PCA injection (not administered)  diphenhydrAMINE (BENADRYL) capsule 25-50 mg (not administered)    Or  diphenhydrAMINE (BENADRYL) 25 mg in sodium chloride 0.9 % 50 mL IVPB (not administered)  senna-docusate  (Senokot-S) tablet 1 tablet (not administered)  polyethylene glycol (MIRALAX / GLYCOLAX) packet 17 g (not administered)  dextrose 5 %-0.45 % sodium chloride infusion (not administered)  HYDROmorphone (DILAUDID) injection 1 mg (1 mg Intravenous Given 01/01/14 0947)  iohexol (OMNIPAQUE) 350 MG/ML injection 100 mL (100 mLs Intravenous Contrast Given 01/01/14 1014)  aspirin chewable tablet 324 mg (324 mg Oral Given 01/01/14 1035)     Labs Review Labs Reviewed  CBC WITH DIFFERENTIAL - Abnormal; Notable for the following:    WBC 21.5 (*)    RBC 1.99 (*)    Hemoglobin 7.0 (*)    HCT 19.3 (*)    MCH 35.2 (*)    MCHC 36.3 (*)    RDW 23.5 (*)    Lymphocytes Relative 8 (*)    Monocytes Relative 14 (*)    nRBC 3 (*)    Neutro Abs 16.6 (*)    Monocytes Absolute 3.0 (*)    All other components within normal limits  COMPREHENSIVE METABOLIC PANEL - Abnormal; Notable for the following:    Glucose, Bld 123 (*)    AST 51 (*)    Total Bilirubin 7.6 (*)    All other components within normal limits  BLOOD GAS, ARTERIAL - Abnormal; Notable for the following:    pCO2 arterial 34.3 (*)    Bicarbonate 19.9 (*)    Acid-base deficit 4.2 (*)    All other components within normal limits  RETICULOCYTES - Abnormal; Notable for the following:    Retic Ct Pct 21.5 (*)    RBC. 2.04 (*)    Retic Count, Manual 438.6 (*)    All other components within normal limits  PRO B NATRIURETIC PEPTIDE - Abnormal; Notable for the following:    Pro B Natriuretic peptide (BNP) 5790.0 (*)    All other components within normal limits  TROPONIN I - Abnormal; Notable for the following:    Troponin I 2.57 (*)    All other components within normal limits  I-STAT TROPOININ, ED - Abnormal; Notable for the following:    Troponin i, poc 0.99 (*)    All other components within normal limits    Imaging Review Ct Angio Chest Pe W/cm &/or Wo Cm  01/01/2014   CLINICAL DATA:  Bilateral chest pain for 1 day. History of sickle cell  anemia. Initial encounter.  EXAM: CT ANGIOGRAPHY CHEST WITH CONTRAST  TECHNIQUE: Multidetector CT imaging of the chest was performed using the standard protocol during bolus administration of intravenous contrast.  Multiplanar CT image reconstructions and MIPs were obtained to evaluate the vascular anatomy.  CONTRAST:  158mL OMNIPAQUE IOHEXOL 350 MG/ML SOLN  COMPARISON:  Radiographs today.  Chest CT 11/25/2013.  FINDINGS: Vascular: The pulmonary arteries are well opacified with contrast. There is a tiny nonobstructing filling defect within a left upper lobe a pulmonary arterial subsegmental branch. This is best seen on thin section axial images 91-98 and appears new. No other pulmonary emboli are demonstrated. The central pulmonary arteries appear widely patent. There is stable dilatation the pulmonary arteries and the right cardiac chambers, but no signs of acute right heart strain. No significant atherosclerosis demonstrated.Cardiomegaly appears stable.  Mediastinum: There is stable nodularity within the anterior mediastinum consistent with residual thymic tissue. Small mediastinal and hilar lymph nodes are stable. The thyroid gland, trachea and esophagus demonstrate no significant findings. The left subclavian Port-A-Cath appears unchanged.  Lungs/Pleura: There is no pleural or pericardial effusion.There is stable chronic lung disease with basilar scarring and chronic interstitial opacities. No superimposed airspace disease or suspicious pulmonary nodule.  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 No acute findings identified.  Musculoskeletal/Chest wall: No chest wall lesion or acute osseous findings.Chronic osseous changes of sickle cell in both humeral heads an the thoracic spine are stable.  Review of the MIP images confirms the above findings.  IMPRESSION: 1. Tiny focus of nonocclusive thromboembolic disease in the left upper lobe. No central pulmonary  emboli demonstrated. 2. Stable chronic dilatation of the pulmonary arteries and right cardiac chambers consistent with pulmonary arterial hypertension. No evidence of acute right heart strain. 3. Stable chronic lung disease. 4. 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 5. Critical Value/emergent results were called by telephone at the time of interpretation on 01/01/2014 at 10:46 am to Dr. Thurnell Garbe, who verbally acknowledged these results.   Electronically Signed   By: Camie Patience M.D.   On: 01/01/2014 10:47   Dg Chest Port 1 View  01/01/2014   CLINICAL DATA:  Shortness of breath with hypoxia.  Sickle cell pain.  EXAM: PORTABLE CHEST - 1 VIEW  COMPARISON:  Radiographs 12/23/2013.  CT 11/25/2013.  FINDINGS: 0035 hr. The left subclavian Port-A-Cath is unchanged at the SVC right atrial junction. Cardiomegaly and chronic vascular congestion are unchanged. There is minimally increased basilar atelectasis compared with the most recent radiographs. There is no overt pulmonary edema, confluent airspace opacity or pleural effusion. Left humeral head avascular necrosis noted.  IMPRESSION: Stable cardiomegaly and chronic vascular congestion. Minimally increased bibasilar atelectasis without focal airspace disease.   Electronically Signed   By: Camie Patience M.D.   On: 01/01/2014 08:47     EKG Interpretation   Date/Time:  Wednesday January 01 2014 08:31:40 EST Ventricular Rate:  97 PR Interval:  168 QRS Duration: 106 QT Interval:  410 QTC Calculation: 521 R Axis:   -76 Text Interpretation:  Sinus rhythm Left atrial enlargement Left axis  deviation RSR' in V1 or V2, probably normal variant Inferior infarct, old  T wave inversion Anterior leads T wave abnormality Lateral leads Prolonged  QT interval When compared with ECG of 12/22/2013 T wave abnormality  Lateral leads is now Present Confirmed by [facility]  MD, Nunzio Cory [phone])  on 01/01/2014 8:36:29 AM      MDM   Final diagnoses:  Shortness of  breath  Chest pain  Hypoxia  Pulmonary embolus   33 year old male with sickle cell anemia, presenting with chest pain and shortness of breath. On  arrival, patient has noted hypoxia, improved with supplemental O2. Patient recently admitted and left AMA prior to CT angiogram of chest pain performed for evaluation of PE.  I have clinical concern for the same today given his profound hypoxia.  Will obtain EKG, labs, portable CXR, CTA chest.  EKG sinus rhythm, prolonged QT noted. Point-of-care troponin elevated at 0.99.  Lab troponin elevated at 2.57.  Patient given 325mg  ASA.  Remainder of lab work notable for leukocytosis of 21.5, retic count of 21.5, and elevated BNP of 5790.  CXR with vascular congestion.  CTA pending at this time.  CTA positive for nonocclusive PE in left upper lobe, no noted heart strain.  Dr. Zigmund Daniel has evaluated patient in the ED and agreed to admit to telemetry. Temporary admission orders placed, patient remained stable at this time.  1:22 PM Notified by nursing staff of critical Troponin, now 7.81 (up from 2.57) a few hours ago.  Cardiology has already been consulted but has not made it over to [facility] ED for evaluation yet. I have notified them of increasing troponin, will send someone over ASAP.  Larene Pickett, PA-C 01/01/14 [place], DO 01/01/14 1754

## 2014-01-01 NOTE — ED Notes (Signed)
Respiratory at bedside.

## 2014-01-01 NOTE — Progress Notes (Signed)
UR conpleted

## 2014-01-02 ENCOUNTER — Inpatient Hospital Stay (HOSPITAL_COMMUNITY): Payer: Medicare Other

## 2014-01-02 DIAGNOSIS — D72829 Elevated white blood cell count, unspecified: Secondary | ICD-10-CM | POA: Diagnosis not present

## 2014-01-02 DIAGNOSIS — I248 Other forms of acute ischemic heart disease: Secondary | ICD-10-CM

## 2014-01-02 DIAGNOSIS — D5700 Hb-SS disease with crisis, unspecified: Secondary | ICD-10-CM | POA: Diagnosis not present

## 2014-01-02 DIAGNOSIS — I272 Other secondary pulmonary hypertension: Secondary | ICD-10-CM | POA: Diagnosis not present

## 2014-01-02 DIAGNOSIS — D57419 Sickle-cell thalassemia with crisis, unspecified: Secondary | ICD-10-CM | POA: Diagnosis not present

## 2014-01-02 DIAGNOSIS — I5031 Acute diastolic (congestive) heart failure: Secondary | ICD-10-CM | POA: Diagnosis not present

## 2014-01-02 DIAGNOSIS — I10 Essential (primary) hypertension: Secondary | ICD-10-CM

## 2014-01-02 DIAGNOSIS — J811 Chronic pulmonary edema: Secondary | ICD-10-CM | POA: Diagnosis not present

## 2014-01-02 DIAGNOSIS — R7989 Other specified abnormal findings of blood chemistry: Secondary | ICD-10-CM

## 2014-01-02 DIAGNOSIS — I270 Primary pulmonary hypertension: Secondary | ICD-10-CM | POA: Diagnosis not present

## 2014-01-02 DIAGNOSIS — I509 Heart failure, unspecified: Secondary | ICD-10-CM | POA: Diagnosis not present

## 2014-01-02 DIAGNOSIS — I2489 Other forms of acute ischemic heart disease: Secondary | ICD-10-CM

## 2014-01-02 DIAGNOSIS — R0602 Shortness of breath: Secondary | ICD-10-CM | POA: Diagnosis not present

## 2014-01-02 DIAGNOSIS — R0902 Hypoxemia: Secondary | ICD-10-CM | POA: Diagnosis not present

## 2014-01-02 DIAGNOSIS — J849 Interstitial pulmonary disease, unspecified: Secondary | ICD-10-CM | POA: Diagnosis not present

## 2014-01-02 DIAGNOSIS — I2699 Other pulmonary embolism without acute cor pulmonale: Secondary | ICD-10-CM | POA: Diagnosis not present

## 2014-01-02 HISTORY — DX: Other forms of acute ischemic heart disease: I24.8

## 2014-01-02 HISTORY — DX: Other forms of acute ischemic heart disease: I24.89

## 2014-01-02 LAB — CBC WITH DIFFERENTIAL/PLATELET
Basophils Absolute: 0 10*3/uL (ref 0.0–0.1)
Basophils Relative: 0 % (ref 0–1)
EOS PCT: 0 % (ref 0–5)
Eosinophils Absolute: 0 10*3/uL (ref 0.0–0.7)
HCT: 23.9 % — ABNORMAL LOW (ref 39.0–52.0)
Hemoglobin: 8.5 g/dL — ABNORMAL LOW (ref 13.0–17.0)
Lymphocytes Relative: 18 % (ref 12–46)
Lymphs Abs: 3.4 10*3/uL (ref 0.7–4.0)
MCH: 32.8 pg (ref 26.0–34.0)
MCHC: 35.6 g/dL (ref 30.0–36.0)
MCV: 92.3 fL (ref 78.0–100.0)
MONOS PCT: 6 % (ref 3–12)
Monocytes Absolute: 1.1 10*3/uL — ABNORMAL HIGH (ref 0.1–1.0)
Neutro Abs: 14.2 10*3/uL — ABNORMAL HIGH (ref 1.7–7.7)
Neutrophils Relative %: 76 % (ref 43–77)
Platelets: 395 10*3/uL (ref 150–400)
RBC: 2.59 MIL/uL — AB (ref 4.22–5.81)
RDW: 20 % — AB (ref 11.5–15.5)
WBC: 18.7 10*3/uL — AB (ref 4.0–10.5)

## 2014-01-02 LAB — COMPREHENSIVE METABOLIC PANEL
ALK PHOS: 85 U/L (ref 39–117)
ALT: 31 U/L (ref 0–53)
AST: 67 U/L — AB (ref 0–37)
Albumin: 4 g/dL (ref 3.5–5.2)
Anion gap: 15 (ref 5–15)
BILIRUBIN TOTAL: 6.7 mg/dL — AB (ref 0.3–1.2)
BUN: 7 mg/dL (ref 6–23)
CHLORIDE: 107 meq/L (ref 96–112)
CO2: 21 meq/L (ref 19–32)
Calcium: 8.9 mg/dL (ref 8.4–10.5)
Creatinine, Ser: 0.59 mg/dL (ref 0.50–1.35)
GFR calc Af Amer: >90 mL/min (ref >90)
GFR calc non Af Amer: >90 mL/min (ref >90)
Glucose, Bld: 107 mg/dL — ABNORMAL HIGH (ref 70–99)
POTASSIUM: 4.3 meq/L (ref 3.7–5.3)
Sodium: 143 mEq/L (ref 137–147)
Total Protein: 7.4 g/dL (ref 6.0–8.3)

## 2014-01-02 LAB — TYPE AND SCREEN
ABO/RH(D): O POS
Antibody Screen: NEGATIVE
Unit division: 0
Unit division: 0

## 2014-01-02 LAB — MRSA PCR SCREENING: MRSA by PCR: NEGATIVE

## 2014-01-02 LAB — TROPONIN I
TROPONIN I: 4.87 ng/mL — AB (ref <0.30)
Troponin I: 6.9 ng/mL (ref <0.30)

## 2014-01-02 LAB — LACTATE DEHYDROGENASE: LDH: 689 U/L — AB (ref 94–250)

## 2014-01-02 NOTE — Progress Notes (Signed)
CRITICAL VALUE ALERT  Critical value received: troponin 9.27  Date of notification: 01/01/14  Time of notification:  1914  Critical value read back:Yes.    Nurse who received alert:  Arlie Solomons  MD notified (1st page):  Baltazar Najjar  Time of first page: 1945  MD notified (2nd page):  Time of second page:  Responding MD:  Baltazar Najjar  Time MD responded: 1950

## 2014-01-02 NOTE — Progress Notes (Addendum)
Patient Name: [name] Date of Encounter: 01/02/2014  Active Problems:   Pulmonary embolus   Length of Stay: 1  SUBJECTIVE  The patient is sleeping. He had a restless night and was agitated. Per nurse no complains of chest pain.   CURRENT MEDS . sodium chloride   Intravenous Once  . folic acid  1 mg Oral q morning - 10a  . HYDROmorphone      . HYDROmorphone PCA 2 mg/mL   Intravenous 6 times per day  . hydroxyurea  1,500 mg Oral Daily  . lisinopril  10 mg Oral Daily  . metoprolol tartrate  25 mg Oral Daily  . morphine  30 mg Oral Q12H  . potassium chloride SA  20 mEq Oral q morning - 10a  . rivaroxaban  20 mg Oral Q breakfast  . senna-docusate  1 tablet Oral BID  . sodium chloride  10-40 mL Intracatheter Q12H    OBJECTIVE  Filed Vitals:   01/02/14 0330 01/02/14 0345 01/02/14 0400 01/02/14 0600  BP:   174/137 122/87  Pulse: 94 96 93 81  Temp:   99.1 F (37.3 C)   TempSrc:   Oral   Resp: 23 16 16 17   Height:      Weight:      SpO2: 90% 88% 98% 89%    Intake/Output Summary (Last 24 hours) at 01/02/14 0832 Last data filed at 01/02/14 0600  Gross per 24 hour  Intake 1597.5 ml  Output    950 ml  Net  647.5 ml   Filed Weights   01/01/14 1649 01/01/14 2109  Weight: 159 lb 14.4 oz (72.53 kg) 160 lb 4.4 oz (72.7 kg)    PHYSICAL EXAM  General: Pleasant, NAD. Neuro: Alert and oriented X 3. Moves all extremities spontaneously. Psych: Normal affect. HEENT:  Normal  Neck: Supple without bruits or JVD. Lungs:  Resp regular and unlabored, CTA. Heart: RRR no s3, s4, or murmurs. Abdomen: Soft, non-tender, non-distended, BS + x 4.  Extremities: No clubbing, cyanosis or edema. DP/PT/Radials 2+ and equal bilaterally.  Accessory Clinical Findings  CBC  Recent Labs  01/01/14 0909 01/02/14 0239  WBC 21.5* 18.7*  NEUTROABS 16.6* 14.2*  HGB 7.0* 8.5*  HCT 19.3* 23.9*  MCV 97.0 92.3  PLT 380 299   Basic Metabolic Panel  Recent Labs  01/01/14 0909  01/01/14 1207 01/02/14 0239  NA 138  --  143  K 3.8  --  4.3  CL 104  --  107  CO2 19  --  21  GLUCOSE 123*  --  107*  BUN 9  --  7  CREATININE 0.83  --  0.59  CALCIUM 8.8  --  8.9  MG  --  1.8  --    Liver Function Tests  Recent Labs  01/01/14 0909 01/02/14 0239  AST 51* 67*  ALT 25 31  ALKPHOS 83 85  BILITOT 7.6* 6.7*  PROT 7.4 7.4  ALBUMIN 4.0 4.0   No results for input(s): LIPASE, AMYLASE in the last 72 hours. Cardiac Enzymes  Recent Labs  01/01/14 1207 01/01/14 1823 01/02/14 0239  TROPONINI 7.81* 9.27* 6.90*   Radiology/Studies  Ct Angio Chest Pe W/cm &/or Wo Cm  01/01/2014   CLINICAL DATA:  Bilateral chest pain for 1 day. History of sickle cell anemia. Initial encounter.Marland Kitchen  IMPRESSION: 1. Tiny focus of nonocclusive thromboembolic disease in the left upper lobe. No central pulmonary emboli demonstrated. 2. Stable chronic dilatation of the pulmonary  arteries and right cardiac chambers consistent with pulmonary arterial hypertension. No evidence of acute right heart strain. 3. Stable chronic lung disease. 4. 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 5. Critical Value/emergent results were called by telephone at the time of interpretation on 01/01/2014 at 10:46 am to Dr. Thurnell Garbe, who verbally acknowledged these results.     Dg Chest Port 1 View  01/02/2014   CLINICAL DATA:  Hypoxia.  EXAM: PORTABLE CHEST - 1 VIEW  COMPARISON:  CT 12 2 2015.  Chest x-ray 12/23/2013.  FINDINGS: Power port catheter in good anatomic position. Severe cardiomegaly. Pulmonary venous congestion mild interstitial prominence suggesting congestive heart failure. Underlying chronic interstitial lung disease. No pleural effusion or pneumothorax. No acute bony abnormality.  IMPRESSION: 1. Power port catheter in good anatomic position. 2. Congestive heart failure with interstitial edema. Underlying chronic interstitial lung disease.   Electronically Signed   By: Marcello Moores  Register   On: 01/02/2014 07:29    TELE: SR, PVCs  ECG: SR, pulmonary pattern, anterolateral ischemia, unchanged from prior  ECHO: 05/2013 Study Conclusions  - Left ventricle: The cavity size was normal. Wall thickness was increased in a pattern of mild LVH. Systolic function was normal. The estimated ejection fraction was in the range of 55% to 60%. Wall motion was normal; there were no regional wall motion abnormalities. Left ventricular diastolic function parameters were normal. - Left atrium: The atrium was mildly dilated. - Right atrium: The atrium was mildly dilated. - Tricuspid valve: Moderate regurgitation. - Pulmonary arteries: PA peak pressure: 43mm Hg (S).     ASSESSMENT AND PLAN  33 year old Afro-American male with past medical history of sickle cell disease, history of PE, hypertension, occasional marijuana use, or history of EtOH abuse, and history of medical noncompliance presented with sickle cell crisis and hypoxia, found to have small PE and anemia with hgb 7.0 and elevated troponin  1. Elevated trop in the setting of hypoxia, PE, sickle cell crisis and severe anemia and severe pulmonary hypertension, now dowtrending 7.9 --> 9.3 --> 6.9 - likely demand ischemia. Although he did have multiple echo in the past with most recent echo in May shows normal EF with no wall motion abnormality, we will recent echo to assess wall motion - would not pursue aggressive invasive workup unless significantly abnormal echo, not a great candidate with noncompliance, once he recovers from sickle cell crises, we would schedule an outpatient stress test.  2. LUL PE: appears to be small, continue Xarelto. No ASA - h/o PE in July 2014  3. Pulmonary HTN - add imdur 30 mg po daily  4. Hypertension - add imdur 30 mg po daily   Signed, Dorothy Spark MD, [facility] 01/02/2014

## 2014-01-02 NOTE — Progress Notes (Signed)
Subjective: Patient feels better with no SOB. His pain level is at 6/10 mainly rib cage. No NVD, no cough. He is still on the Dilaudid PCa and has used 12.5 mg so far last 24 hours. He is in the step down unit but has required less oxygen than yesterday. He has not been out of bed to see if his condition will worsen. He thinks he is close to his baseline.   Objective: Vital signs in last 24 hours: Temp:  [98 F (36.7 C)-99.2 F (37.3 C)] 99.1 F (37.3 C) (12/03 0400) Pulse Rate:  [75-192] 81 (12/03 0600) Resp:  [13-31] 17 (12/03 0600) BP: (115-174)/(74-137) 122/87 mmHg (12/03 0600) SpO2:  [77 %-98 %] 89 % (12/03 0600) FiO2 (%):  [40 %] 40 % (12/02 2033) Weight:  [72.53 kg (159 lb 14.4 oz)-72.7 kg (160 lb 4.4 oz)] 72.7 kg (160 lb 4.4 oz) (12/02 2109) Weight change:  Last BM Date: 01/01/14  Intake/Output from previous day: 12/02 0701 - 12/03 0700 In: 1597.5 [P.O.:720; I.V.:277.5; Blood:600] Out: 950 [Urine:950] Intake/Output this shift:    General appearance: alert, cooperative and no distress Eyes: conjunctivae/corneas clear. PERRL, EOM's intact. Fundi benign. Neck: no adenopathy, no carotid bruit, no JVD, supple, symmetrical, trachea midline and thyroid not enlarged, symmetric, no tenderness/mass/nodules Back: symmetric, no curvature. ROM normal. No CVA tenderness. Resp: diminished breath sounds base - bilaterally and bilaterally Chest wall: no tenderness Cardio: regular rate and rhythm, S1, S2 normal, no murmur, click, rub or gallop GI: soft, non-tender; bowel sounds normal; no masses,  no organomegaly Extremities: extremities normal, atraumatic, no cyanosis or edema Pulses: 2+ and symmetric Skin: Skin color, texture, turgor normal. No rashes or lesions Neurologic: Grossly normal  Lab Results:  Recent Labs  01/01/14 0909 01/02/14 0239  WBC 21.5* 18.7*  HGB 7.0* 8.5*  HCT 19.3* 23.9*  PLT 380 395   BMET  Recent Labs  01/01/14 0909 01/02/14 0239  NA 138 143  K  3.8 4.3  CL 104 107  CO2 19 21  GLUCOSE 123* 107*  BUN 9 7  CREATININE 0.83 0.59  CALCIUM 8.8 8.9    Studies/Results: Ct Angio Chest Pe W/cm &/or Wo Cm  01/01/2014   CLINICAL DATA:  Bilateral chest pain for 1 day. History of sickle cell anemia. Initial encounter.  EXAM: CT ANGIOGRAPHY CHEST WITH CONTRAST  TECHNIQUE: Multidetector CT imaging of the chest was performed using the standard protocol during bolus administration of intravenous contrast. Multiplanar CT image reconstructions and MIPs were obtained to evaluate the vascular anatomy.  CONTRAST:  175mL OMNIPAQUE IOHEXOL 350 MG/ML SOLN  COMPARISON:  Radiographs today.  Chest CT 11/25/2013.  FINDINGS: Vascular: The pulmonary arteries are well opacified with contrast. There is a tiny nonobstructing filling defect within a left upper lobe a pulmonary arterial subsegmental branch. This is best seen on thin section axial images 91-98 and appears new. No other pulmonary emboli are demonstrated. The central pulmonary arteries appear widely patent. There is stable dilatation the pulmonary arteries and the right cardiac chambers, but no signs of acute right heart strain. No significant atherosclerosis demonstrated.Cardiomegaly appears stable.  Mediastinum: There is stable nodularity within the anterior mediastinum consistent with residual thymic tissue. Small mediastinal and hilar lymph nodes are stable. The thyroid gland, trachea and esophagus demonstrate no significant findings. The left subclavian Port-A-Cath appears unchanged.  Lungs/Pleura: There is no pleural or pericardial effusion.There is stable chronic lung disease with basilar scarring and chronic interstitial opacities. No superimposed airspace disease or suspicious pulmonary  nodule.  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 No acute findings identified.  Musculoskeletal/Chest wall: No chest wall lesion or acute osseous findings.Chronic  osseous changes of sickle cell in both humeral heads an the thoracic spine are stable.  Review of the MIP images confirms the above findings.  IMPRESSION: 1. Tiny focus of nonocclusive thromboembolic disease in the left upper lobe. No central pulmonary emboli demonstrated. 2. Stable chronic dilatation of the pulmonary arteries and right cardiac chambers consistent with pulmonary arterial hypertension. No evidence of acute right heart strain. 3. Stable chronic lung disease. 4. 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 5. Critical Value/emergent results were called by telephone at the time of interpretation on 01/01/2014 at 10:46 am to Dr. Thurnell Garbe, who verbally acknowledged these results.   Electronically Signed   By: Camie Patience M.D.   On: 01/01/2014 10:47   Dg Chest Port 1 View  01/02/2014   CLINICAL DATA:  Hypoxia.  EXAM: PORTABLE CHEST - 1 VIEW  COMPARISON:  CT 12 2 2015.  Chest x-ray 12/23/2013.  FINDINGS: Power port catheter in good anatomic position. Severe cardiomegaly. Pulmonary venous congestion mild interstitial prominence suggesting congestive heart failure. Underlying chronic interstitial lung disease. No pleural effusion or pneumothorax. No acute bony abnormality.  IMPRESSION: 1. Power port catheter in good anatomic position. 2. Congestive heart failure with interstitial edema. Underlying chronic interstitial lung disease.   Electronically Signed   By: [place]   On: 01/02/2014 07:29   Dg Chest Port 1 View  01/01/2014   CLINICAL DATA:  Shortness of breath with hypoxia.  Sickle cell pain.  EXAM: PORTABLE CHEST - 1 VIEW  COMPARISON:  Radiographs 12/23/2013.  CT 11/25/2013.  FINDINGS: 0035 hr. The left subclavian Port-A-Cath is unchanged at the SVC right atrial junction. Cardiomegaly and chronic vascular congestion are unchanged. There is minimally increased basilar atelectasis compared with the most recent radiographs. There is no overt pulmonary edema, confluent airspace opacity or  pleural effusion. Left humeral head avascular necrosis noted.  IMPRESSION: Stable cardiomegaly and chronic vascular congestion. Minimally increased bibasilar atelectasis without focal airspace disease.   Electronically Signed   By: Camie Patience M.D.   On: 01/01/2014 08:47    Medications: I have reviewed the patient's current medications.  Assessment/Plan: A 33 yo man admitted with hypoxemia, Sickle cell crisis and demand ischemia.  #1 Demand ischemia: this seems to have resolved with the transfusion of 2 units PRBC. Cardiology have seen patient and will be followed out patient for management of chronic Pulmonary edema.  #2. Hypoxemia: Oxygen demand has decreased. Patient has no significant SOB and CXR shows no significant edema. Will continue lasix as needed and monitir. May move out of Stepdown unit tomorrow.  #3 Sickle Cell Painful Crisis: Patient will be maintained on Dilaudid PCA and hydration. He is feeling better now and will not change his setting today.  #4 Chronic anti coagulation: Will continue Xarelto.  #5 Sickle Cell Anemia: Stable hemoglobin. Continue to monitor.   LOS: 1 day   Shloima Clinch,[name] 01/02/2014, 11:10 AM

## 2014-01-02 NOTE — Discharge Summary (Signed)
[name] MRN: [id number] DOB/AGE: 04/10/1980 33 y.o.  Admit date: 12/23/2013 Discharge date: 01/02/2014  Primary Care Physician:  Shauntee Karp A., MD   Discharge Diagnoses:   Patient Active Problem List   Diagnosis Date Noted  . NSTEMI (non-ST elevated myocardial infarction) 06/18/2013    Priority: High  . Acute chest syndrome 06/18/2013    Priority: High  . Sickle cell crisis 06/14/2013    Priority: Medium  . Avascular necrosis     Priority: Medium  . Pulmonary embolus 01/01/2014  . Paralytic strabismus, external ophthalmoplegia   . Dehydration 12/12/2013  . Chronic pain syndrome 12/12/2013  . Elevated troponin 11/26/2013  . Sickle cell pain crisis 11/26/2013  . Acute hip pain 10/27/2013  . Sickle cell anemia with crisis 10/17/2013  . Anemia 09/22/2013  . Acute sickle cell crisis 08/26/2013  . Chronic anticoagulation 08/22/2013  . HTN (hypertension) 08/22/2013  . Sickle cell anemia with pain 07/10/2013  . Thrombocytosis 07/10/2013  . Thrombocytosis 07/01/2013  . Pulmonary HTN 06/18/2013  . Bilateral flank pain 06/17/2013  . Sickle cell anemia 06/17/2013  . Hypoxia 06/17/2013  . Hypoxemia 06/17/2013  . Acute respiratory failure with hypoxia 06/02/2013  . HCAP (healthcare-associated pneumonia) 06/02/2013  . Diastolic congestive heart failure, NYHA class 1 05/19/2013  . Sickle-cell crisis with associated acute chest syndrome 05/13/2013  . Functional asplenia   . Alcohol consumption of one to four drinks per day   . Hypokalemia 04/22/2013  . Unspecified vitamin D deficiency 02/13/2013  . Uses marijuana 01/13/2013  . Leukocytosis, unspecified 08/30/2012  . 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 Hx of pulmonary embolus 06/29/2012    DISCHARGE MEDICATION:   Medication List    ASK your doctor about these medications        folic acid 1 MG tablet  Commonly known as:  FOLVITE  Take 1 tablet (1 mg total) by mouth every morning.     hydroxyurea 500  MG capsule  Commonly known as:  HYDREA  Take 3 capsules (1,500 mg total) by mouth daily. May take with food to minimize GI side effects.     levofloxacin 750 MG tablet  Commonly known as:  LEVAQUIN  Take 1 tablet (750 mg total) by mouth daily.     lisinopril 10 MG tablet  Commonly known as:  PRINIVIL,ZESTRIL  Take 1 tablet (10 mg total) by mouth daily.     metoprolol tartrate 25 MG tablet  Commonly known as:  LOPRESSOR  Take 1 tablet (25 mg total) by mouth daily.     morphine 30 MG 12 hr tablet  Commonly known as:  MS CONTIN  Take 1 tablet (30 mg total) by mouth every 12 (twelve) hours.     potassium chloride SA 20 MEQ tablet  Commonly known as:  K-DUR,KLOR-CON  Take 1 tablet (20 mEq total) by mouth every morning.     rivaroxaban 20 MG Tabs tablet  Commonly known as:  XARELTO  Take 1 tablet (20 mg total) by mouth every morning.     zolpidem 10 MG tablet  Commonly known as:  AMBIEN  Take 1 tablet (10 mg total) by mouth at bedtime as needed for sleep.          Consults:     SIGNIFICANT DIAGNOSTIC STUDIES:    No results found for this or any previous visit (from the past 240 hour(s)).  BRIEF ADMITTING H & P: [name] is a 33 y.o. male  With a history of sickle  cell anemia, pulmonary embolism, hypertension that presented to the emergency department with complaints of hip and back pain. Patient had presented to the emergency department on 11/22 at which point he was given pain medications and felt well enough to go home. Patient comes back today, 11/23 for pain. Patient stated that he ran out of his home pain medications for approximately 1 week. He was unable to see his doctor as he could not find a ride. Currently patient complains of nagging chronic pain in his ribs bilaterally as well as his back. He denies chest pain, SOB, cough, fever, chills, recent travel or sick contacts. Patient does admit to having one episode of vomiting at home yesterday after  returning from the emergency department. At this time he denies abdominal pain, nausea, vomiting, constipation or diarrhea.    Hospital Course:  Present on Admission:  . Hb SS with crisis: Pt was managed with PCA, Toradol and IVF. He was improving with regard to his pain but left AMA before treatment could be completed. . Hypoxemia: Pt normally has baseline saturations of 96-97%. However during this hospitalization his saturations on RA were 84-85%. He has been without Xarelto for more than 1 week thus the index of suspicion is higher for PE.  Marland Kitchen Hx of pulmonary embolus: Resume Xarelto . HTN (hypertension): BP was controlled throughout hospitalization. . Acute respiratory failure with hypoxia: See above. Concerned about component of Pulmonary HTN. PT has not kept appointments with Pulmonary and Cardiology.  Disposition and Follow-up:  Pt left AMA   DISCHARGE EXAM:  Examination not performed at time of discharge as patient left hospital AMA. He was hypoxic and in need of an evaluation for PE (CT angiogram ordered) but despite the explained risks felt that he had to leave to tattend to personal matters.  Total time spent including face to face and decision making was <30 minutes  Signed:  Brookelle Pellicane A. 01/02/2014, 9:38 AM

## 2014-01-02 NOTE — Progress Notes (Signed)
Echocardiogram 2D Echocardiogram has been performed.  [name] 01/02/2014, 4:37 PM

## 2014-01-02 NOTE — Progress Notes (Signed)
At the beginning of RN's shift, patient's troponin came back at 9.27 and patient's oxygen sat was slowly dropping, he was needing his oxygen increased. Dr Zigmund Daniel called up to floor and put in orders for EKG. Since EKG was abnormal and troponin elevated, with dropping oxygen sats, patient transferred to [facility]. Before patient was transferred, first unit of blood was started, then report was given to stepdown. RN and NT then brought patient to stepdown.

## 2014-01-02 NOTE — Progress Notes (Addendum)
Late entry for 01/01/14. Patient recently became active with [employer] services for community resources such as transportation and mobile meals. However, patient has been readmitted prior to [employer], [employer]. [facility] telephonic RNCM contacted patient after last hospital discharge for transition of care and to assess for [facility] RN needs. Patient declined [facility] RN assistance during post transition of care call. Stated he only wanted assist from [employer] worker home visit. Went to bedside to speak with patient while he was in the ED and he confirmed he only wishes assist for community assistance with [facility] and not assist from [facility]. Consents obtained and [facility] packet left at bedside. Will make inpatient RNCM and LCSW aware.  Marthenia Rolling, MSN- [facility] [phone]

## 2014-01-03 DIAGNOSIS — I10 Essential (primary) hypertension: Secondary | ICD-10-CM | POA: Diagnosis not present

## 2014-01-03 DIAGNOSIS — D72829 Elevated white blood cell count, unspecified: Secondary | ICD-10-CM | POA: Diagnosis not present

## 2014-01-03 DIAGNOSIS — I5031 Acute diastolic (congestive) heart failure: Secondary | ICD-10-CM

## 2014-01-03 DIAGNOSIS — I509 Heart failure, unspecified: Secondary | ICD-10-CM

## 2014-01-03 DIAGNOSIS — I272 Other secondary pulmonary hypertension: Secondary | ICD-10-CM | POA: Diagnosis not present

## 2014-01-03 DIAGNOSIS — I270 Primary pulmonary hypertension: Secondary | ICD-10-CM | POA: Diagnosis not present

## 2014-01-03 DIAGNOSIS — R0902 Hypoxemia: Secondary | ICD-10-CM | POA: Diagnosis not present

## 2014-01-03 DIAGNOSIS — R7989 Other specified abnormal findings of blood chemistry: Secondary | ICD-10-CM | POA: Diagnosis not present

## 2014-01-03 DIAGNOSIS — I2699 Other pulmonary embolism without acute cor pulmonale: Secondary | ICD-10-CM | POA: Diagnosis not present

## 2014-01-03 DIAGNOSIS — R0602 Shortness of breath: Secondary | ICD-10-CM | POA: Diagnosis not present

## 2014-01-03 DIAGNOSIS — D5700 Hb-SS disease with crisis, unspecified: Secondary | ICD-10-CM | POA: Diagnosis not present

## 2014-01-03 LAB — HEMOGLOBINOPATHY EVALUATION
HEMOGLOBIN OTHER: 0 %
HGB S QUANTITAION: 86.4 % — AB
Hgb A2 Quant: 3.7 % — ABNORMAL HIGH (ref 2.2–3.2)
Hgb A: 1.9 % — ABNORMAL LOW (ref 96.8–97.8)
Hgb F Quant: 8 % — ABNORMAL HIGH (ref 0.0–2.0)

## 2014-01-03 MED ORDER — KETOROLAC TROMETHAMINE 30 MG/ML IJ SOLN
30.0000 mg | Freq: Four times a day (QID) | INTRAMUSCULAR | Status: DC
  Administered 2014-01-03 – 2014-01-06 (×12): 30 mg via INTRAVENOUS
  Filled 2014-01-03 (×15): qty 1

## 2014-01-03 MED ORDER — MORPHINE SULFATE ER 15 MG PO TBCR
EXTENDED_RELEASE_TABLET | ORAL | Status: AC
  Filled 2014-01-03: qty 1

## 2014-01-03 NOTE — Progress Notes (Signed)
Patient Name: [name] Date of Encounter: 01/03/2014  Active Problems:   Pulmonary embolus   Length of Stay: 2  SUBJECTIVE  The patient feels much better today, he has minimal SOB and no chest pain. He states that when he is not in sickle cell crisis he never experiences chest pain.   CURRENT MEDS . sodium chloride   Intravenous Once  . folic acid  1 mg Oral q morning - 10a  . HYDROmorphone PCA 2 mg/mL   Intravenous 6 times per day  . hydroxyurea  1,500 mg Oral Daily  . lisinopril  10 mg Oral Daily  . metoprolol tartrate  25 mg Oral Daily  . morphine  30 mg Oral Q12H  . potassium chloride SA  20 mEq Oral q morning - 10a  . rivaroxaban  20 mg Oral Q breakfast  . senna-docusate  1 tablet Oral BID  . sodium chloride  10-40 mL Intracatheter Q12H    OBJECTIVE  Filed Vitals:   01/03/14 0053 01/03/14 0055 01/03/14 0400 01/03/14 0405  BP:      Pulse:  82    Temp:   99.1 F (37.3 C)   TempSrc:   Oral   Resp: 18 18  19   Height:      Weight:      SpO2: 97% 90%  95%    Intake/Output Summary (Last 24 hours) at 01/03/14 0838 Last data filed at 01/03/14 0700  Gross per 24 hour  Intake    740 ml  Output   2900 ml  Net  -2160 ml   Filed Weights   01/01/14 1649 01/01/14 2109  Weight: 159 lb 14.4 oz (72.53 kg) 160 lb 4.4 oz (72.7 kg)    PHYSICAL EXAM  General: Pleasant, NAD. Neuro: Alert and oriented X 3. Moves all extremities spontaneously. Psych: Normal affect. HEENT:  Normal  Neck: Supple without bruits or JVD. Lungs:  Resp regular and unlabored, CTA. Heart: RRR no s3, s4, 2/6 systolic murmur. Abdomen: Soft, non-tender, non-distended, BS + x 4.  Extremities: No clubbing, cyanosis or edema. DP/PT/Radials 2+ and equal bilaterally.  Accessory Clinical Findings  CBC  Recent Labs  01/01/14 0909 01/02/14 0239  WBC 21.5* 18.7*  NEUTROABS 16.6* 14.2*  HGB 7.0* 8.5*  HCT 19.3* 23.9*  MCV 97.0 92.3  PLT 380 233   Basic Metabolic Panel  Recent Labs  01/01/14 0909 01/01/14 1207 01/02/14 0239  NA 138  --  143  K 3.8  --  4.3  CL 104  --  107  CO2 19  --  21  GLUCOSE 123*  --  107*  BUN 9  --  7  CREATININE 0.83  --  0.59  CALCIUM 8.8  --  8.9  MG  --  1.8  --    Liver Function Tests  Recent Labs  01/01/14 0909 01/02/14 0239  AST 51* 67*  ALT 25 31  ALKPHOS 83 85  BILITOT 7.6* 6.7*  PROT 7.4 7.4  ALBUMIN 4.0 4.0   No results for input(s): LIPASE, AMYLASE in the last 72 hours. Cardiac Enzymes  Recent Labs  01/01/14 1823 01/02/14 0239 01/02/14 0930  TROPONINI 9.27* 6.90* 4.87*   Radiology/Studies  Ct Angio Chest Pe W/cm &/or Wo Cm  01/01/2014   CLINICAL DATA:  Bilateral chest pain for 1 day. History of sickle cell anemia. Initial encounter.Marland Kitchen  IMPRESSION: 1. Tiny focus of nonocclusive thromboembolic disease in the left upper lobe. No central pulmonary emboli demonstrated. 2.  Stable chronic dilatation of the pulmonary arteries and right cardiac chambers consistent with pulmonary arterial hypertension. No evidence of acute right heart strain. 3. Stable chronic lung disease. 4. 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 5. Critical Value/emergent results were called by telephone at the time of interpretation on 01/01/2014 at 10:46 am to Dr. Thurnell Garbe, who verbally acknowledged these results.     Dg Chest Port 1 View  01/02/2014   CLINICAL DATA:  Hypoxia.  EXAM: PORTABLE CHEST - 1 VIEW  COMPARISON:  CT 12 2 2015.  Chest x-ray 12/23/2013.  FINDINGS: Power port catheter in good anatomic position. Severe cardiomegaly. Pulmonary venous congestion mild interstitial prominence suggesting congestive heart failure. Underlying chronic interstitial lung disease. No pleural effusion or pneumothorax. No acute bony abnormality.  IMPRESSION: 1. Power port catheter in good anatomic position. 2. Congestive heart failure with interstitial edema. Underlying chronic interstitial lung disease.   Electronically Signed   By: Marcello Moores  Register   On:  01/02/2014 07:29   TELE: SR, PVCs  ECG: SR, pulmonary pattern, anterolateral ischemia, unchanged from prior  ECHO: 01/02/2014 Study Conclusions  - Left ventricle: The cavity size was normal. Systolic function was normal. The estimated ejection fraction was in the range of 55% to 60%. Wall motion was normal; there were no regional wall motion abnormalities. - Ventricular septum: There was D-shaped septum consistent with elevated right ventricular pressure/volume overload. - Aortic valve: There was trivial regurgitation. - Mitral valve: Mild prolapse, involving the anterior leaflet. - Right ventricle: The cavity size was moderately dilated. Wall thickness was normal. Systolic function was moderately reduced. - Right atrium: The atrium was moderately to severely dilated. - Tricuspid valve: Leaflet thickening. There was moderate regurgitation. - Pulmonary arteries: Systolic pressure was moderately to severely increased. PA peak pressure: 62 mm Hg (S). - Pericardium, extracardiac: A trivial pericardial effusion was identified posterior to the heart.  Impressions:  - Compared to the prior study, there has been no significant interval change.    ASSESSMENT AND PLAN  33 year old Afro-American male with past medical history of sickle cell disease, history of PE, hypertension, occasional marijuana use, or history of EtOH abuse, and history of medical noncompliance presented with sickle cell crisis and hypoxia, found to have small PE and anemia with hgb 7.0 and elevated troponin  1. Elevated trop in the setting of hypoxia, PE, sickle cell crisis and severe anemia and severe pulmonary hypertension, now dowtrending 7.9 --> 9.3 --> 6.9 --> 4.8 - likely demand ischemia. Although he did have multiple echo in the past with most recent echo in May shows normal EF with no wall motion abnormality, we will recent echo to assess wall motion - would not  pursue aggressive invasive workup unless significantly abnormal echo, not a great candidate with noncompliance, once he recovers from sickle cell crises, we would schedule an outpatient stress test.  2. LUL PE: appears to be small, continue Xarelto. No ASA - h/o PE in July 2014  3. Right ventricular failure and Pulmonary HTN - sec to pulmonary embolism and sickle cell, add imdur 30 mg po daily  4. Hypertension - now controlled on 30 mg po daily  5. Acute diastolic CHF - edema on CXR< not fluid overloaded on physical exam, I wouldn't diurese in the settings of sickle cell crisis and just follow for now. Negative 2000 since yesterday with no lasix    Signed, Dorothy Spark MD, [facility] 01/03/2014

## 2014-01-03 NOTE — Progress Notes (Signed)
Patient ID: [name], male   DOB: 1980/07/30, 33 y.o.   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 He had been without Xarelto for about 1 week at the time and a PE was suspected. He was advised to be evaluated for a PE, however he left AMA stating that he had to get home.   He has continued to have chest wall pain and has continued to be without the Xarelto. He presented today with C/O chest wall pain which he states is very typical of his Sickle Cell Pain. He describes the pain as throbbing in nature and localized to the left chest wall. The pain is sharp and throbbing in nature. He reports that the pain began 3 days ago and he was treating with his usual oral analgesics. However the pain escalated during the night last night and was unmanageable with oral medication so he came to the ED. He denies fever, chills, DOE. He does have more jaundice than his usual baseline  In the ED,he was also found to be hypoxic and tachycardic. Further evaluation by CTA showed a small PE. He also had elevated troponins and Pro-BNP with some T-wave inversion noted on EKG.   Consultants:  None  Procedures:  None  Antibiotics:  none  HPI/Subjective: Pt states that his pain is about a 9/10 mostly in his back. He feels better and only minimally short of breath with exertion. Denies any cough or chest pain but does feel some pain in his ribs.   Objective: Filed Vitals:   01/03/14 0806 01/03/14 0905 01/03/14 1200 01/03/14 1600  BP:   116/75 130/84  Pulse:   61 77  Temp:   97.7 F (36.5 C)   TempSrc:   Oral   Resp: 17 17 18 17   Height:      Weight:      SpO2: 97% 97% 98% 97%   Weight change:   Intake/Output Summary (Last 24 hours) at 01/03/14 1613 Last data filed  at 01/03/14 1610  Gross per 24 hour  Intake    170 ml  Output   2725 ml  Net  -2555 ml    General: Alert, awake, oriented x3, in no acute distress.  HEENT: [place]/AT PEERL, EOMI Neck: Trachea midline,  no masses, no JVD, no carotid bruit, no LAD OROPHARYNX:  Moist, No exudate/ erythema/lesions.  Heart: Regular rate and rhythm, without murmurs, rubs, gallops Lungs: Clear to auscultation, no wheezing or rhonchi noted.  Abdomen: Soft, nontender, nondistended, positive bowel sounds, no masses no hepatosplenomegaly noted. Neuro: No focal neurological deficits noted cranial nerves II through XII grossly intact. Strength 5 out of 5 in bilateral upper and lower extremities. Musculoskeletal: No warm swelling or erythema around joints, no spinal tenderness noted.   Data Reviewed: Basic Metabolic Panel:  Recent Labs Lab 01/01/14 0909 01/01/14 1207 01/02/14 0239  NA 138  --  143  K 3.8  --  4.3  CL 104  --  107  CO2 19  --  21  GLUCOSE 123*  --  107*  BUN 9  --  7  CREATININE 0.83  --  0.59  CALCIUM 8.8  --  8.9  MG  --  1.8  --    Liver Function Tests:  Recent Labs Lab 01/01/14 0909 01/02/14 0239  AST 51* 67*  ALT 25 31  ALKPHOS 83 85  BILITOT 7.6* 6.7*  PROT 7.4 7.4  ALBUMIN 4.0 4.0   CBC:  Recent Labs Lab 01/01/14 0909 01/02/14 0239  WBC 21.5* 18.7*  NEUTROABS 16.6* 14.2*  HGB 7.0* 8.5*  HCT 19.3* 23.9*  MCV 97.0 92.3  PLT 380 395    Recent Labs  01/01/14 0909  PROBNP 5790.0*   Studies: Dg Chest 2 View  12/23/2013   CLINICAL DATA:  Sickle cell crisis, rib pain  EXAM: CHEST  2 VIEW  COMPARISON:  12/22/2013  FINDINGS: LEFT subclavian power port with tip in SVC.  Enlargement of cardiac silhouette with pulmonary vascular congestion.  Moderate peribronchial thickening with accentuation of perihilar markings.  Minimal RIGHT base atelectasis.  Minimal scarring RIGHT mid lung and LEFT lower lobe.  No acute infiltrate, pleural effusion or pneumothorax.  Bones  unremarkable.  IMPRESSION: Enlargement of cardiac silhouette with pulmonary vascular congestion.  Moderate bronchitic changes with RIGHT basilar atelectasis and scattered scarring.  No acute abnormalities.   Electronically Signed   By: Lavonia Dana M.D.   On: 12/23/2013 13:02   Dg Chest 2 View  12/22/2013   CLINICAL DATA:  Fever, weakness and body aches. History of sickle cell anemia.  EXAM: CHEST - 2 VIEW  COMPARISON:  10/30/2013  FINDINGS: Stable appearance of left-sided port with catheter tip at the cavoatrial junction. Stable appearance of pulmonary scarring bilaterally. There is no evidence of pulmonary edema, consolidation, pneumothorax, nodule or pleural fluid. The heart size is stable and at the upper limits of normal. The visualized bony thorax is unremarkable.  IMPRESSION: No active disease.  Stable pulmonary scarring.   Electronically Signed   By: Aletta Edouard M.D.   On: 12/22/2013 12:27   Ct Angio Chest Pe W/cm &/or Wo Cm  01/01/2014   CLINICAL DATA:  Bilateral chest pain for 1 day. History of sickle cell anemia. Initial encounter.  EXAM: CT ANGIOGRAPHY CHEST WITH CONTRAST  TECHNIQUE: Multidetector CT imaging of the chest was performed using the standard protocol during bolus administration of intravenous contrast. Multiplanar CT image reconstructions and MIPs were obtained to evaluate the vascular anatomy.  CONTRAST:  113mL OMNIPAQUE IOHEXOL 350 MG/ML SOLN  COMPARISON:  Radiographs today.  Chest CT 11/25/2013.  FINDINGS: Vascular: The pulmonary arteries are well opacified with contrast. There is a tiny nonobstructing filling defect within a left upper lobe a pulmonary arterial subsegmental branch. This is best seen on thin section axial images 91-98 and appears new. No other pulmonary emboli are demonstrated. The central pulmonary arteries appear widely patent. There is stable dilatation the pulmonary arteries and the right cardiac chambers, but no signs of acute right heart strain. No  significant atherosclerosis demonstrated.Cardiomegaly appears stable.  Mediastinum: There is stable nodularity within the anterior mediastinum consistent with residual thymic tissue. Small mediastinal and hilar lymph nodes are stable. The thyroid gland, trachea and esophagus demonstrate no significant findings. The left subclavian Port-A-Cath appears unchanged.  Lungs/Pleura: There is no pleural or pericardial effusion.There is stable chronic lung disease with basilar scarring and chronic interstitial opacities. No superimposed airspace disease or suspicious pulmonary nodule.  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 No acute findings identified.  Musculoskeletal/Chest wall: No chest wall lesion or acute osseous findings.Chronic osseous changes of sickle cell in both humeral heads an the thoracic spine are stable.  Review of the MIP images confirms  the above findings.  IMPRESSION: 1. Tiny focus of nonocclusive thromboembolic disease in the left upper lobe. No central pulmonary emboli demonstrated. 2. Stable chronic dilatation of the pulmonary arteries and right cardiac chambers consistent with pulmonary arterial hypertension. No evidence of acute right heart strain. 3. Stable chronic lung disease. 4. 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 5. Critical Value/emergent results were called by telephone at the time of interpretation on 01/01/2014 at 10:46 am to Dr. Thurnell Garbe, who verbally acknowledged these results.   Electronically Signed   By: Camie Patience M.D.   On: 01/01/2014 10:47   Dg Chest Port 1 View  01/02/2014   CLINICAL DATA:  Hypoxia.  EXAM: PORTABLE CHEST - 1 VIEW  COMPARISON:  CT 12 2 2015.  Chest x-ray 12/23/2013.  FINDINGS: Power port catheter in good anatomic position. Severe cardiomegaly. Pulmonary venous congestion mild interstitial prominence suggesting congestive heart failure. Underlying chronic interstitial lung disease. No pleural  effusion or pneumothorax. No acute bony abnormality.  IMPRESSION: 1. Power port catheter in good anatomic position. 2. Congestive heart failure with interstitial edema. Underlying chronic interstitial lung disease.   Electronically Signed   By: [place]   On: 01/02/2014 07:29   Dg Chest Port 1 View  01/01/2014   CLINICAL DATA:  Shortness of breath with hypoxia.  Sickle cell pain.  EXAM: PORTABLE CHEST - 1 VIEW  COMPARISON:  Radiographs 12/23/2013.  CT 11/25/2013.  FINDINGS: 0035 hr. The left subclavian Port-A-Cath is unchanged at the SVC right atrial junction. Cardiomegaly and chronic vascular congestion are unchanged. There is minimally increased basilar atelectasis compared with the most recent radiographs. There is no overt pulmonary edema, confluent airspace opacity or pleural effusion. Left humeral head avascular necrosis noted.  IMPRESSION: Stable cardiomegaly and chronic vascular congestion. Minimally increased bibasilar atelectasis without focal airspace disease.   Electronically Signed   By: Camie Patience M.D.   On: 01/01/2014 08:47     Scheduled Meds: . sodium chloride   Intravenous Once  . folic acid  1 mg Oral q morning - 10a  . HYDROmorphone PCA 2 mg/mL   Intravenous 6 times per day  . hydroxyurea  1,500 mg Oral Daily  . lisinopril  10 mg Oral Daily  . metoprolol tartrate  25 mg Oral Daily  . morphine  30 mg Oral Q12H  . potassium chloride SA  20 mEq Oral q morning - 10a  . rivaroxaban  20 mg Oral Q breakfast  . senna-docusate  1 tablet Oral BID  . sodium chloride  10-40 mL Intracatheter Q12H   Continuous Infusions: . dextrose 5 % and 0.45% NaCl 1,000 mL (01/01/14 1900)    Active Problems:   Pulmonary embolus   Assessment/Plan: Active Problems:   Pulmonary embolus  1. Sickle Cell Anemia with crisis: Continue  Dilaudid PCA 0.7mg  q10 min. Start Toradol 30 mg q6h.  Continue MS contin for long acting and PO Benadryl for itch. Stop IV push Dilaudid 2. Hypoxemia:  Improved now stable on 4 LPM O2 via [place]. TTE on 12/3 showing pHTN, and is likely the underlying cause, in addition to some fluid overload on presentation. Has received several doses of Lasix. Will continue to wean O2 to keep O2 sat above 95%. Will likely need home O2 and follow-up appointment in pulmonary HTN clinic. Due to stable clinic status, will transfer out of step down to gen med floor.  3. Anemia: Hgb of 8.5 after 2 units PRBC. Monitor. 4. Sickle Cell Care: Continue Folic  Acid and Hydroxyurea  5. H/o PE: Continue Rivaroxaban 6. HTN: Well controlled. Continue home Lisinopril and Metoprolol   7. FEN/GI: KVO  D5/0.45% NS. Continue KDur. Regular Diet  Bowel regimen in place  Code Status: Full code DVT Prophylaxis: on Xarelto Family Communication: none Disposition Plan: home once pt is able to manage pain at home and hypoxia stable  Kalman Shan  Pager [phone]. If 7PM-7AM, please contact night-coverage.  01/03/2014, 4:12 PM  LOS: 2 days   Kalman Shan

## 2014-01-04 ENCOUNTER — Other Ambulatory Visit: Payer: Self-pay

## 2014-01-04 ENCOUNTER — Other Ambulatory Visit: Payer: Self-pay | Admitting: Cardiology

## 2014-01-04 DIAGNOSIS — R0902 Hypoxemia: Secondary | ICD-10-CM | POA: Diagnosis not present

## 2014-01-04 DIAGNOSIS — I272 Other secondary pulmonary hypertension: Secondary | ICD-10-CM | POA: Diagnosis not present

## 2014-01-04 DIAGNOSIS — I2699 Other pulmonary embolism without acute cor pulmonale: Secondary | ICD-10-CM | POA: Diagnosis not present

## 2014-01-04 DIAGNOSIS — I270 Primary pulmonary hypertension: Secondary | ICD-10-CM | POA: Diagnosis not present

## 2014-01-04 DIAGNOSIS — I248 Other forms of acute ischemic heart disease: Secondary | ICD-10-CM | POA: Diagnosis not present

## 2014-01-04 DIAGNOSIS — D72829 Elevated white blood cell count, unspecified: Secondary | ICD-10-CM | POA: Diagnosis not present

## 2014-01-04 DIAGNOSIS — R0602 Shortness of breath: Secondary | ICD-10-CM | POA: Diagnosis not present

## 2014-01-04 DIAGNOSIS — D5700 Hb-SS disease with crisis, unspecified: Secondary | ICD-10-CM | POA: Diagnosis not present

## 2014-01-04 DIAGNOSIS — I214 Non-ST elevation (NSTEMI) myocardial infarction: Secondary | ICD-10-CM

## 2014-01-04 DIAGNOSIS — I5031 Acute diastolic (congestive) heart failure: Secondary | ICD-10-CM | POA: Diagnosis not present

## 2014-01-04 LAB — BASIC METABOLIC PANEL
ANION GAP: 15 (ref 5–15)
BUN: 14 mg/dL (ref 6–23)
CO2: 22 mEq/L (ref 19–32)
Calcium: 9.2 mg/dL (ref 8.4–10.5)
Chloride: 106 mEq/L (ref 96–112)
Creatinine, Ser: 0.64 mg/dL (ref 0.50–1.35)
Glucose, Bld: 114 mg/dL — ABNORMAL HIGH (ref 70–99)
POTASSIUM: 4.4 meq/L (ref 3.7–5.3)
Sodium: 143 mEq/L (ref 137–147)

## 2014-01-04 LAB — CBC WITH DIFFERENTIAL/PLATELET
Basophils Absolute: 0.1 10*3/uL (ref 0.0–0.1)
Basophils Relative: 0 % (ref 0–1)
EOS ABS: 0.4 10*3/uL (ref 0.0–0.7)
Eosinophils Relative: 3 % (ref 0–5)
HCT: 26 % — ABNORMAL LOW (ref 39.0–52.0)
HEMOGLOBIN: 9.2 g/dL — AB (ref 13.0–17.0)
Lymphocytes Relative: 15 % (ref 12–46)
Lymphs Abs: 2.4 10*3/uL (ref 0.7–4.0)
MCH: 33.1 pg (ref 26.0–34.0)
MCHC: 35.4 g/dL (ref 30.0–36.0)
MCV: 93.5 fL (ref 78.0–100.0)
MONO ABS: 2.4 10*3/uL — AB (ref 0.1–1.0)
MONOS PCT: 15 % — AB (ref 3–12)
NEUTROS ABS: 11.1 10*3/uL — AB (ref 1.7–7.7)
NEUTROS PCT: 68 % (ref 43–77)
Platelets: 454 10*3/uL — ABNORMAL HIGH (ref 150–400)
RBC: 2.78 MIL/uL — ABNORMAL LOW (ref 4.22–5.81)
RDW: 19.1 % — ABNORMAL HIGH (ref 11.5–15.5)
WBC: 16.4 10*3/uL — ABNORMAL HIGH (ref 4.0–10.5)

## 2014-01-04 LAB — RETICULOCYTES
RBC.: 2.78 MIL/uL — AB (ref 4.22–5.81)
RETIC COUNT ABSOLUTE: 453.1 10*3/uL — AB (ref 19.0–186.0)
Retic Ct Pct: 16.3 % — ABNORMAL HIGH (ref 0.4–3.1)

## 2014-01-04 LAB — LACTATE DEHYDROGENASE: LDH: 696 U/L — ABNORMAL HIGH (ref 94–250)

## 2014-01-04 MED ORDER — HYDROMORPHONE 2 MG/ML HIGH CONCENTRATION IV PCA SOLN
INTRAVENOUS | Status: DC
  Administered 2014-01-04: 7.7 mg via INTRAVENOUS
  Administered 2014-01-04: 11.3 mg via INTRAVENOUS
  Administered 2014-01-05: 4.5 mg via INTRAVENOUS
  Administered 2014-01-05: 1.5 mg via INTRAVENOUS
  Administered 2014-01-05: 4 mg via INTRAVENOUS

## 2014-01-04 MED ORDER — ONDANSETRON HCL 4 MG/2ML IJ SOLN
4.0000 mg | Freq: Four times a day (QID) | INTRAMUSCULAR | Status: DC | PRN
  Administered 2014-01-04: 4 mg via INTRAVENOUS
  Filled 2014-01-04: qty 2

## 2014-01-04 NOTE — Progress Notes (Signed)
Patient ID: IMRI LOR, male   DOB: [birth year], 33 y.o.   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 He had been without Xarelto for about 1 week at the time and a PE was suspected. He was advised to be evaluated for a PE, however he left AMA stating that he had to get home.   He has continued to have chest wall pain and has continued to be without the Xarelto. He presented today with C/O chest wall pain which he states is very typical of his Sickle Cell Pain. He describes the pain as throbbing in nature and localized to the left chest wall. The pain is sharp and throbbing in nature. He reports that the pain began 3 days ago and he was treating with his usual oral analgesics. However the pain escalated during the night last night and was unmanageable with oral medication so he came to the ED. He denies fever, chills, DOE. He does have more jaundice than his usual baseline  In the ED,he was also found to be hypoxic and tachycardic. Further evaluation by CTA showed a small PE. He also had elevated troponins and Pro-BNP with some T-wave inversion noted on EKG.   Consultants:  None  Procedures:  None  Antibiotics:  none  HPI/Subjective: Pt states that his pain is better at a 6/10 mostly in his back. He feels weak and nauseated. Denies any vomiting or diarrhea. Last BM was yesterday.   Objective: Filed Vitals:   01/04/14 0919 01/04/14 0931 01/04/14 1029 01/04/14 1332  BP:    107/61  Pulse:    61  Temp:    98.1 F (36.7 C)  TempSrc:    Oral  Resp:   18 18  Height:      Weight:      SpO2: 100% 92% 93% 95%   Weight change:   Intake/Output Summary (Last 24 hours) at 01/04/14 1449 Last data filed at 01/04/14 1300  Gross per 24 hour  Intake    360 ml   Output   1125 ml  Net   -765 ml    General: Alert, awake, oriented x3, in no acute distress.  HEENT: [place]/AT PEERL, EOMI Neck: Trachea midline,  no masses, no JVD, no carotid bruit, no LAD OROPHARYNX:  Moist, No exudate/ erythema/lesions.  Heart: Regular rate and rhythm, without murmurs, rubs, gallops Lungs: Clear to auscultation, no wheezing or rhonchi noted.  Abdomen: Soft, nontender, nondistended, positive bowel sounds, no masses no hepatosplenomegaly noted. Extremities: no c/c/e  Data Reviewed: Basic Metabolic Panel:  Recent Labs Lab 01/01/14 0909 01/01/14 1207 01/02/14 0239 01/04/14 0450  NA 138  --  143 143  K 3.8  --  4.3 4.4  CL 104  --  107 106  CO2 19  --  21 22  GLUCOSE 123*  --  107* 114*  BUN 9  --  7 14  CREATININE 0.83  --  0.59 0.64  CALCIUM 8.8  --  8.9 9.2  MG  --  1.8  --   --    Liver Function Tests:  Recent Labs Lab 01/01/14 0909 01/02/14 0239  AST 51* 67*  ALT 25 31  ALKPHOS 83 85  BILITOT 7.6* 6.7*  PROT 7.4 7.4  ALBUMIN  4.0 4.0   CBC:  Recent Labs Lab 01/01/14 0909 01/02/14 0239 01/04/14 0450  WBC 21.5* 18.7* 16.4*  NEUTROABS 16.6* 14.2* 11.1*  HGB 7.0* 8.5* 9.2*  HCT 19.3* 23.9* 26.0*  MCV 97.0 92.3 93.5  PLT 380 395 454*    Recent Labs  01/01/14 0909  PROBNP 5790.0*   Studies: Dg Chest 2 View  12/23/2013   CLINICAL DATA:  Sickle cell crisis, rib pain  EXAM: CHEST  2 VIEW  COMPARISON:  12/22/2013  FINDINGS: LEFT subclavian power port with tip in SVC.  Enlargement of cardiac silhouette with pulmonary vascular congestion.  Moderate peribronchial thickening with accentuation of perihilar markings.  Minimal RIGHT base atelectasis.  Minimal scarring RIGHT mid lung and LEFT lower lobe.  No acute infiltrate, pleural effusion or pneumothorax.  Bones unremarkable.  IMPRESSION: Enlargement of cardiac silhouette with pulmonary vascular congestion.  Moderate bronchitic changes with RIGHT basilar atelectasis and scattered scarring.  No  acute abnormalities.   Electronically Signed   By: Lavonia Dana M.D.   On: 12/23/2013 13:02   Dg Chest 2 View  12/22/2013   CLINICAL DATA:  Fever, weakness and body aches. History of sickle cell anemia.  EXAM: CHEST - 2 VIEW  COMPARISON:  10/30/2013  FINDINGS: Stable appearance of left-sided port with catheter tip at the cavoatrial junction. Stable appearance of pulmonary scarring bilaterally. There is no evidence of pulmonary edema, consolidation, pneumothorax, nodule or pleural fluid. The heart size is stable and at the upper limits of normal. The visualized bony thorax is unremarkable.  IMPRESSION: No active disease.  Stable pulmonary scarring.   Electronically Signed   By: Aletta Edouard M.D.   On: 12/22/2013 12:27   Ct Angio Chest Pe W/cm &/or Wo Cm  01/01/2014   CLINICAL DATA:  Bilateral chest pain for 1 day. History of sickle cell anemia. Initial encounter.  EXAM: CT ANGIOGRAPHY CHEST WITH CONTRAST  TECHNIQUE: Multidetector CT imaging of the chest was performed using the standard protocol during bolus administration of intravenous contrast. Multiplanar CT image reconstructions and MIPs were obtained to evaluate the vascular anatomy.  CONTRAST:  130mL OMNIPAQUE IOHEXOL 350 MG/ML SOLN  COMPARISON:  Radiographs today.  Chest CT 11/25/2013.  FINDINGS: Vascular: The pulmonary arteries are well opacified with contrast. There is a tiny nonobstructing filling defect within a left upper lobe a pulmonary arterial subsegmental branch. This is best seen on thin section axial images 91-98 and appears new. No other pulmonary emboli are demonstrated. The central pulmonary arteries appear widely patent. There is stable dilatation the pulmonary arteries and the right cardiac chambers, but no signs of acute right heart strain. No significant atherosclerosis demonstrated.Cardiomegaly appears stable.  Mediastinum: There is stable nodularity within the anterior mediastinum consistent with residual thymic tissue. Small  mediastinal and hilar lymph nodes are stable. The thyroid gland, trachea and esophagus demonstrate no significant findings. The left subclavian Port-A-Cath appears unchanged.  Lungs/Pleura: There is no pleural or pericardial effusion.There is stable chronic lung disease with basilar scarring and chronic interstitial opacities. No superimposed airspace disease or suspicious pulmonary nodule.  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 No acute findings identified.  Musculoskeletal/Chest wall: No chest wall lesion or acute osseous findings.Chronic osseous changes of sickle cell in both humeral heads an the thoracic spine are stable.  Review of the MIP images confirms the above findings.  IMPRESSION: 1. Tiny focus of nonocclusive thromboembolic disease in the left upper lobe. No central pulmonary emboli  demonstrated. 2. Stable chronic dilatation of the pulmonary arteries and right cardiac chambers consistent with pulmonary arterial hypertension. No evidence of acute right heart strain. 3. Stable chronic lung disease. 4. 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 5. Critical Value/emergent results were called by telephone at the time of interpretation on 01/01/2014 at 10:46 am to Dr. Thurnell Garbe, who verbally acknowledged these results.   Electronically Signed   By: Camie Patience M.D.   On: 01/01/2014 10:47   Dg Chest Port 1 View  01/02/2014   CLINICAL DATA:  Hypoxia.  EXAM: PORTABLE CHEST - 1 VIEW  COMPARISON:  CT 12 2 2015.  Chest x-ray 12/23/2013.  FINDINGS: Power port catheter in good anatomic position. Severe cardiomegaly. Pulmonary venous congestion mild interstitial prominence suggesting congestive heart failure. Underlying chronic interstitial lung disease. No pleural effusion or pneumothorax. No acute bony abnormality.  IMPRESSION: 1. Power port catheter in good anatomic position. 2. Congestive heart failure with interstitial edema. Underlying chronic  interstitial lung disease.   Electronically Signed   By: [place]   On: 01/02/2014 07:29   Dg Chest Port 1 View  01/01/2014   CLINICAL DATA:  Shortness of breath with hypoxia.  Sickle cell pain.  EXAM: PORTABLE CHEST - 1 VIEW  COMPARISON:  Radiographs 12/23/2013.  CT 11/25/2013.  FINDINGS: 0035 hr. The left subclavian Port-A-Cath is unchanged at the SVC right atrial junction. Cardiomegaly and chronic vascular congestion are unchanged. There is minimally increased basilar atelectasis compared with the most recent radiographs. There is no overt pulmonary edema, confluent airspace opacity or pleural effusion. Left humeral head avascular necrosis noted.  IMPRESSION: Stable cardiomegaly and chronic vascular congestion. Minimally increased bibasilar atelectasis without focal airspace disease.   Electronically Signed   By: Camie Patience M.D.   On: 01/01/2014 08:47     Scheduled Meds: . sodium chloride   Intravenous Once  . folic acid  1 mg Oral q morning - 10a  . HYDROmorphone PCA 2 mg/mL   Intravenous 6 times per day  . hydroxyurea  1,500 mg Oral Daily  . ketorolac  30 mg Intravenous 4 times per day  . lisinopril  10 mg Oral Daily  . metoprolol tartrate  25 mg Oral Daily  . morphine  30 mg Oral Q12H  . potassium chloride SA  20 mEq Oral q morning - 10a  . rivaroxaban  20 mg Oral Q breakfast  . senna-docusate  1 tablet Oral BID  . sodium chloride  10-40 mL Intracatheter Q12H   Continuous Infusions: . dextrose 5 % and 0.45% NaCl 1,000 mL (01/01/14 1900)    Active Problems:   Pulmonary embolus   Assessment/Plan: Active Problems:   Pulmonary embolus  1. Sickle Cell Anemia with crisis: Decrease Dilaudid PCA to 0.5mg  q10 min. Continue Toradol 30 mg q6h.  Continue MS contin for long acting and PO Benadryl for itch. Stop IV push Dilaudid 2. Hypoxemia: Improved now weaned to 3 LPM O2 via [place]. TTE on 12/3 showing pHTN, and is likely the underlying cause, in addition to some fluid overload on  presentation. Has received several doses of Lasix. Will continue to wean O2 to keep O2 sat above 95%. He is maintaining O2 sat above 95% at 3 LPM, Drops to 92% on RA. Will likely need home O2 and follow-up appointment in pulmonary HTN clinic.  3. Nausea: Ordered Zofran 4mg  PRN 4. Anemia: Hgb of 8.5 after 2 units PRBC. Monitor. 5. Sickle Cell Care: Continue Folic Acid and Hydroxyurea  6. H/o PE: Continue Rivaroxaban 7. HTN: Well controlled. Continue home Lisinopril and Metoprolol   7. FEN/GI: KVO  D5/0.45% NS. Continue KDur. Regular Diet  Bowel regimen in place  Code Status: Full code DVT Prophylaxis: on Xarelto Family Communication: none Disposition Plan: home once pt is able to manage pain at home and hypoxia stable  Kalman Shan  Pager [phone]. If 7PM-7AM, please contact night-coverage.  01/04/2014, 2:49 PM  LOS: 3 days   Kalman Shan

## 2014-01-04 NOTE — Progress Notes (Signed)
Waste 55mL (6mg ) of high dose hydromorphone in sink from old syringe when replacing with new syringe.  Witnessed by: Aviva Kluver RN  Loranda Mastel, Zenovia Jordan, RN

## 2014-01-04 NOTE — Progress Notes (Signed)
Patient Name: [name] Date of Encounter: 01/04/2014  Active Problems:   Pulmonary embolus   Length of Stay: 3  SUBJECTIVE  The patient feels much better today, he has minimal SOB and no chest pain. He was complaining of rib pain. No evidence for CHF. Troponin has a typical rise and fall and is not well explained by a small PE.  CURRENT MEDS . sodium chloride   Intravenous Once  . folic acid  1 mg Oral q morning - 10a  . HYDROmorphone PCA 2 mg/mL   Intravenous 6 times per day  . hydroxyurea  1,500 mg Oral Daily  . ketorolac  30 mg Intravenous 4 times per day  . lisinopril  10 mg Oral Daily  . metoprolol tartrate  25 mg Oral Daily  . morphine  30 mg Oral Q12H  . potassium chloride SA  20 mEq Oral q morning - 10a  . rivaroxaban  20 mg Oral Q breakfast  . senna-docusate  1 tablet Oral BID  . sodium chloride  10-40 mL Intracatheter Q12H    OBJECTIVE  Filed Vitals:   01/03/14 2345 01/04/14 0020 01/04/14 0400 01/04/14 0435  BP:  115/73  111/68  Pulse:  63  61  Temp:  98.6 F (37 C)  98.4 F (36.9 C)  TempSrc:  Oral  Oral  Resp: 18 18 16 18   Height:      Weight:      SpO2: 97% 98% 96% 94%    Intake/Output Summary (Last 24 hours) at 01/04/14 0843 Last data filed at 01/04/14 0320  Gross per 24 hour  Intake      0 ml  Output   1275 ml  Net  -1275 ml   Filed Weights   01/01/14 1649 01/01/14 2109  Weight: 159 lb 14.4 oz (72.53 kg) 160 lb 4.4 oz (72.7 kg)    PHYSICAL EXAM  General: Pleasant, NAD. Neuro: Alert and oriented X 3. Moves all extremities spontaneously. Psych: Normal affect. HEENT:  Normal  Neck: Supple without bruits or JVD. Lungs:  Resp regular and unlabored, CTA. Heart: RRR no s3, s4, 2/6 systolic murmur. Abdomen: Soft, non-tender, non-distended, BS + x 4.  Extremities: No clubbing, cyanosis or edema. DP/PT/Radials 2+ and equal bilaterally.  Accessory Clinical Findings  CBC  Recent Labs  01/02/14 0239 01/04/14 0450  WBC 18.7*  16.4*  NEUTROABS 14.2* 11.1*  HGB 8.5* 9.2*  HCT 23.9* 26.0*  MCV 92.3 93.5  PLT 395 914*   Basic Metabolic Panel  Recent Labs  01/01/14 1207 01/02/14 0239 01/04/14 0450  NA  --  143 143  K  --  4.3 4.4  CL  --  107 106  CO2  --  21 22  GLUCOSE  --  107* 114*  BUN  --  7 14  CREATININE  --  0.59 0.64  CALCIUM  --  8.9 9.2  MG 1.8  --   --    Liver Function Tests  Recent Labs  01/01/14 0909 01/02/14 0239  AST 51* 67*  ALT 25 31  ALKPHOS 83 85  BILITOT 7.6* 6.7*  PROT 7.4 7.4  ALBUMIN 4.0 4.0   No results for input(s): LIPASE, AMYLASE in the last 72 hours. Cardiac Enzymes  Recent Labs  01/01/14 1823 01/02/14 0239 01/02/14 0930  TROPONINI 9.27* 6.90* 4.87*   Radiology/Studies  Ct Angio Chest Pe W/cm &/or Wo Cm  01/01/2014   CLINICAL DATA:  Bilateral chest pain for 1 day. History  of sickle cell anemia. Initial encounter.Marland Kitchen  IMPRESSION: 1. Tiny focus of nonocclusive thromboembolic disease in the left upper lobe. No central pulmonary emboli demonstrated. 2. Stable chronic dilatation of the pulmonary arteries and right cardiac chambers consistent with pulmonary arterial hypertension. No evidence of acute right heart strain. 3. Stable chronic lung disease. 4. 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 5. Critical Value/emergent results were called by telephone at the time of interpretation on 01/01/2014 at 10:46 am to Dr. Thurnell Garbe, who verbally acknowledged these results.     Dg Chest Port 1 View  01/02/2014   CLINICAL DATA:  Hypoxia.  EXAM: PORTABLE CHEST - 1 VIEW  COMPARISON:  CT 12 2 2015.  Chest x-ray 12/23/2013.  FINDINGS: Power port catheter in good anatomic position. Severe cardiomegaly. Pulmonary venous congestion mild interstitial prominence suggesting congestive heart failure. Underlying chronic interstitial lung disease. No pleural effusion or pneumothorax. No acute bony abnormality.  IMPRESSION: 1. Power port catheter in good anatomic position. 2. Congestive  heart failure with interstitial edema. Underlying chronic interstitial lung disease.   Electronically Signed   By: Marcello Moores  Register   On: 01/02/2014 07:29   TELE: SR, PVCs  ECG: SR, pulmonary pattern, anterolateral ischemia, unchanged from prior  ECHO: 01/02/2014 Study Conclusions  - Left ventricle: The cavity size was normal. Systolic function was normal. The estimated ejection fraction was in the range of 55% to 60%. Wall motion was normal; there were no regional wall motion abnormalities. - Ventricular septum: There was D-shaped septum consistent with elevated right ventricular pressure/volume overload. - Aortic valve: There was trivial regurgitation. - Mitral valve: Mild prolapse, involving the anterior leaflet. - Right ventricle: The cavity size was moderately dilated. Wall thickness was normal. Systolic function was moderately reduced. - Right atrium: The atrium was moderately to severely dilated. - Tricuspid valve: Leaflet thickening. There was moderate regurgitation. - Pulmonary arteries: Systolic pressure was moderately to severely increased. PA peak pressure: 62 mm Hg (S). - Pericardium, extracardiac: A trivial pericardial effusion was identified posterior to the heart.  Impressions:  - Compared to the prior study, there has been no significant interval change.    ASSESSMENT AND PLAN  33 year old Afro-American male with past medical history of sickle cell disease, history of PE, hypertension, occasional marijuana use, or history of EtOH abuse, and history of medical noncompliance presented with sickle cell crisis and hypoxia, found to have small PE and anemia with hgb 7.0 and elevated troponin  1. Elevated trop in the setting of hypoxia, PE, sickle cell crisis and severe anemia and severe pulmonary hypertension, now dowtrending 7.9 --> 9.3 --> 6.9 --> 4.8 - likely demand ischemia. Although he did have multiple echo in the past with most  recent echo in May shows normal EF with no wall motion abnormality. 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  Agree with outpatient stress testing.  2. LUL PE: appears to be small, continue Xarelto. No ASA - h/o PE in July 2014  3. Right ventricular failure and Pulmonary HTN - sec to pulmonary embolism and sickle cell, add imdur 30 mg po daily  4. Hypertension - now controlled on 30 mg po daily  5. Acute diastolic CHF - no evidence for significant volume overload on exam.  Would arrange for outpatient stress testing - lexiscan NST at the [facility] office after discharge. Follow-up with Dr. Stanford Breed as an outpatient. No further recommendations at this time.  Cardiology will  sign-off. Call with questions.  Pixie Casino, MD, [facility] Attending Cardiologist [facility]  HILTY,Kenneth C 01/04/2014

## 2014-01-05 DIAGNOSIS — R0902 Hypoxemia: Secondary | ICD-10-CM | POA: Diagnosis not present

## 2014-01-05 DIAGNOSIS — I5031 Acute diastolic (congestive) heart failure: Secondary | ICD-10-CM | POA: Diagnosis not present

## 2014-01-05 DIAGNOSIS — D5700 Hb-SS disease with crisis, unspecified: Secondary | ICD-10-CM | POA: Diagnosis not present

## 2014-01-05 DIAGNOSIS — D72829 Elevated white blood cell count, unspecified: Secondary | ICD-10-CM | POA: Diagnosis not present

## 2014-01-05 DIAGNOSIS — I2699 Other pulmonary embolism without acute cor pulmonale: Secondary | ICD-10-CM | POA: Diagnosis not present

## 2014-01-05 DIAGNOSIS — R0602 Shortness of breath: Secondary | ICD-10-CM | POA: Diagnosis not present

## 2014-01-05 DIAGNOSIS — I272 Other secondary pulmonary hypertension: Secondary | ICD-10-CM | POA: Diagnosis not present

## 2014-01-05 LAB — HEMOGLOBIN AND HEMATOCRIT, BLOOD
HCT: 25.3 % — ABNORMAL LOW (ref 39.0–52.0)
HEMOGLOBIN: 8.7 g/dL — AB (ref 13.0–17.0)

## 2014-01-05 MED ORDER — HYDROMORPHONE 2 MG/ML HIGH CONCENTRATION IV PCA SOLN
INTRAVENOUS | Status: DC
  Administered 2014-01-05: 0 mg via INTRAVENOUS
  Administered 2014-01-05: 3 mg via INTRAVENOUS
  Administered 2014-01-05: 0.3 mg via INTRAVENOUS
  Administered 2014-01-06: 5.7 mg via INTRAVENOUS
  Administered 2014-01-06: 3.3 mg via INTRAVENOUS
  Administered 2014-01-06: 5.4 mg via INTRAVENOUS

## 2014-01-05 MED ORDER — SODIUM CHLORIDE 0.9 % IV SOLN
Freq: Once | INTRAVENOUS | Status: AC
  Administered 2014-01-05: 11:00:00 via INTRAVENOUS

## 2014-01-05 MED ORDER — HYDROMORPHONE 2 MG/ML HIGH CONCENTRATION IV PCA SOLN: INTRAVENOUS | Status: DC

## 2014-01-05 MED ORDER — HYDROMORPHONE HCL 4 MG PO TABS: 4.0000 mg | ORAL_TABLET | ORAL | Status: DC | PRN

## 2014-01-05 NOTE — Plan of Care (Signed)
Problem: Phase I Progression Outcomes Goal: Pt. aware of pain management plan Outcome: Progressing  Comments:  PCA

## 2014-01-05 NOTE — Progress Notes (Signed)
Patient ID: [name], male   DOB: 01/02/1981, 33 y.o.   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 He had been without Xarelto for about 1 week at the time and a PE was suspected. He was advised to be evaluated for a PE, however he left AMA stating that he had to get home.   He has continued to have chest wall pain and has continued to be without the Xarelto. He presented today with C/O chest wall pain which he states is very typical of his Sickle Cell Pain. He describes the pain as throbbing in nature and localized to the left chest wall. The pain is sharp and throbbing in nature. He reports that the pain began 3 days ago and he was treating with his usual oral analgesics. However the pain escalated during the night last night and was unmanageable with oral medication so he came to the ED. He denies fever, chills, DOE. He does have more jaundice than his usual baseline  In the ED,he was also found to be hypoxic and tachycardic. Further evaluation by CTA showed a small PE. He also had elevated troponins and Pro-BNP with some T-wave inversion noted on EKG.   Consultants:  None  Procedures:  None  Antibiotics:  none  HPI/Subjective: Pt states that pain is okay. He has no HA.   Objective: Filed Vitals:   01/05/14 0446 01/05/14 0835 01/05/14 0941 01/05/14 0944  BP: 106/64  93/43 90/52  Pulse: 64  61 60  Temp: 98 F (36.7 C)     TempSrc: Oral     Resp: 18 12    Height:      Weight:      SpO2: 96% 97% 94%    Weight change:   Intake/Output Summary (Last 24 hours) at 01/05/14 1112 Last data filed at 01/05/14 0605  Gross per 24 hour  Intake 1070.83 ml  Output   1350 ml  Net -279.17 ml    General: Alert, awake, oriented x3, in no acute  distress. Resting in bed. Not cooperative with exam Heart: Regular rate and rhythm, without murmurs, rubs, gallops Lungs: Clear to auscultation Neuro: Able to move all extremities  Data Reviewed: Basic Metabolic Panel:  Recent Labs Lab 01/01/14 0909 01/01/14 1207 01/02/14 0239 01/04/14 0450  NA 138  --  143 143  K 3.8  --  4.3 4.4  CL 104  --  107 106  CO2 19  --  21 22  GLUCOSE 123*  --  107* 114*  BUN 9  --  7 14  CREATININE 0.83  --  0.59 0.64  CALCIUM 8.8  --  8.9 9.2  MG  --  1.8  --   --    Liver Function Tests:  Recent Labs Lab 01/01/14 0909 01/02/14 0239  AST 51* 67*  ALT 25 31  ALKPHOS 83 85  BILITOT 7.6* 6.7*  PROT 7.4 7.4  ALBUMIN 4.0 4.0   CBC:  Recent Labs Lab 01/01/14 0909 01/02/14 0239 01/04/14 0450 01/05/14 0350  WBC 21.5* 18.7* 16.4*  --   NEUTROABS 16.6* 14.2* 11.1*  --   HGB 7.0* 8.5* 9.2* 8.7*  HCT 19.3* 23.9* 26.0* 25.3*  MCV 97.0 92.3 93.5  --   PLT 380  395 454*  --     Recent Labs  01/01/14 0909  PROBNP 5790.0*   Studies: Dg Chest 2 View  12/23/2013   CLINICAL DATA:  Sickle cell crisis, rib pain  EXAM: CHEST  2 VIEW  COMPARISON:  12/22/2013  FINDINGS: LEFT subclavian power port with tip in SVC.  Enlargement of cardiac silhouette with pulmonary vascular congestion.  Moderate peribronchial thickening with accentuation of perihilar markings.  Minimal RIGHT base atelectasis.  Minimal scarring RIGHT mid lung and LEFT lower lobe.  No acute infiltrate, pleural effusion or pneumothorax.  Bones unremarkable.  IMPRESSION: Enlargement of cardiac silhouette with pulmonary vascular congestion.  Moderate bronchitic changes with RIGHT basilar atelectasis and scattered scarring.  No acute abnormalities.   Electronically Signed   By: Lavonia Dana M.D.   On: 12/23/2013 13:02   Dg Chest 2 View  12/22/2013   CLINICAL DATA:  Fever, weakness and body aches. History of sickle cell anemia.  EXAM: CHEST - 2 VIEW  COMPARISON:  10/30/2013  FINDINGS: Stable  appearance of left-sided port with catheter tip at the cavoatrial junction. Stable appearance of pulmonary scarring bilaterally. There is no evidence of pulmonary edema, consolidation, pneumothorax, nodule or pleural fluid. The heart size is stable and at the upper limits of normal. The visualized bony thorax is unremarkable.  IMPRESSION: No active disease.  Stable pulmonary scarring.   Electronically Signed   By: Aletta Edouard M.D.   On: 12/22/2013 12:27   Ct Angio Chest Pe W/cm &/or Wo Cm  01/01/2014   CLINICAL DATA:  Bilateral chest pain for 1 day. History of sickle cell anemia. Initial encounter.  EXAM: CT ANGIOGRAPHY CHEST WITH CONTRAST  TECHNIQUE: Multidetector CT imaging of the chest was performed using the standard protocol during bolus administration of intravenous contrast. Multiplanar CT image reconstructions and MIPs were obtained to evaluate the vascular anatomy.  CONTRAST:  144mL OMNIPAQUE IOHEXOL 350 MG/ML SOLN  COMPARISON:  Radiographs today.  Chest CT 11/25/2013.  FINDINGS: Vascular: The pulmonary arteries are well opacified with contrast. There is a tiny nonobstructing filling defect within a left upper lobe a pulmonary arterial subsegmental branch. This is best seen on thin section axial images 91-98 and appears new. No other pulmonary emboli are demonstrated. The central pulmonary arteries appear widely patent. There is stable dilatation the pulmonary arteries and the right cardiac chambers, but no signs of acute right heart strain. No significant atherosclerosis demonstrated.Cardiomegaly appears stable.  Mediastinum: There is stable nodularity within the anterior mediastinum consistent with residual thymic tissue. Small mediastinal and hilar lymph nodes are stable. The thyroid gland, trachea and esophagus demonstrate no significant findings. The left subclavian Port-A-Cath appears unchanged.  Lungs/Pleura: There is no pleural or pericardial effusion.There is stable chronic lung disease with  basilar scarring and chronic interstitial opacities. No superimposed airspace disease or suspicious pulmonary nodule.  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 No acute findings identified.  Musculoskeletal/Chest wall: No chest wall lesion or acute osseous findings.Chronic osseous changes of sickle cell in both humeral heads an the thoracic spine are stable.  Review of the MIP images confirms the above findings.  IMPRESSION: 1. Tiny focus of nonocclusive thromboembolic disease in the left upper lobe. No central pulmonary emboli demonstrated. 2. Stable chronic dilatation of the pulmonary arteries and right cardiac chambers consistent with pulmonary arterial hypertension. No evidence of acute right heart strain. 3. Stable chronic lung disease. 4. 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 5. Critical  Value/emergent results were called by telephone at the time of interpretation on 01/01/2014 at 10:46 am to Dr. Thurnell Garbe, who verbally acknowledged these results.   Electronically Signed   By: Camie Patience M.D.   On: 01/01/2014 10:47   Dg Chest Port 1 View  01/02/2014   CLINICAL DATA:  Hypoxia.  EXAM: PORTABLE CHEST - 1 VIEW  COMPARISON:  CT 12 2 2015.  Chest x-ray 12/23/2013.  FINDINGS: Power port catheter in good anatomic position. Severe cardiomegaly. Pulmonary venous congestion mild interstitial prominence suggesting congestive heart failure. Underlying chronic interstitial lung disease. No pleural effusion or pneumothorax. No acute bony abnormality.  IMPRESSION: 1. Power port catheter in good anatomic position. 2. Congestive heart failure with interstitial edema. Underlying chronic interstitial lung disease.   Electronically Signed   By: [place]   On: 01/02/2014 07:29   Dg Chest Port 1 View  01/01/2014   CLINICAL DATA:  Shortness of breath with hypoxia.  Sickle cell pain.  EXAM: PORTABLE CHEST - 1 VIEW  COMPARISON:  Radiographs 12/23/2013.  CT  11/25/2013.  FINDINGS: 0035 hr. The left subclavian Port-A-Cath is unchanged at the SVC right atrial junction. Cardiomegaly and chronic vascular congestion are unchanged. There is minimally increased basilar atelectasis compared with the most recent radiographs. There is no overt pulmonary edema, confluent airspace opacity or pleural effusion. Left humeral head avascular necrosis noted.  IMPRESSION: Stable cardiomegaly and chronic vascular congestion. Minimally increased bibasilar atelectasis without focal airspace disease.   Electronically Signed   By: Camie Patience M.D.   On: 01/01/2014 08:47     Scheduled Meds: . sodium chloride   Intravenous Once  . folic acid  1 mg Oral q morning - 10a  . HYDROmorphone PCA 2 mg/mL   Intravenous 6 times per day  . hydroxyurea  1,500 mg Oral Daily  . ketorolac  30 mg Intravenous 4 times per day  . lisinopril  10 mg Oral Daily  . metoprolol tartrate  25 mg Oral Daily  . morphine  30 mg Oral Q12H  . potassium chloride SA  20 mEq Oral q morning - 10a  . rivaroxaban  20 mg Oral Q breakfast  . senna-docusate  1 tablet Oral BID  . sodium chloride  10-40 mL Intracatheter Q12H   Continuous Infusions: . dextrose 5 % and 0.45% NaCl 1,000 mL (01/01/14 1900)    Active Problems:   Pulmonary embolus   Assessment/Plan: Active Problems:   Pulmonary embolus  1. Sickle Cell Anemia with crisis: Used 29 mg of Dilaudid PCA in 24 hours, with little recent use. Decrease Dilaudid PCA to 0.3mg  q10 min. Continue Toradol 30 mg q6h.  Continue MS contin for long acting and PO Benadryl for itch. Start home Dilaudid 4mg  q4h PRN. 2. Hypoxemia: Pt refusing to wear oxygen. He was advised to place oxygen on and has agreed to place it on when awake. His O2 sat is 92-95% on RA. Will like to keep O2 sat above 95%. Was last stable on 3 LPM O2 via [place]. Will restart at 2LPM and titrate up to meet goals. His TTE on 12/3 showing pHTN, and is likely the underlying cause.  Will likely need home  O2 and follow-up appointment in pulmonary HTN clinic.  3. Hypotension: Pt had two low BP measurments this morning of 90-93/43-52. His I/O have been net negative for the past few days. Will give 590mL NS bolus and re-measure.  4. Fatigue: Pt responds appropriately to questions but does not want to  be bothered for exam. He relays no HA and can move all of his extremities. Will have full neuro exam performed when he awakens to screen for stroke. 5. Nausea:Zofran 4mg  PRN 6. Anemia: Hgb stable at 8.7. Received 2 units PRBC on 12/3. Monitor. 7. Sickle Cell Care: Continue Folic Acid and Hydroxyurea  8. H/o PE: Continue Rivaroxaban 9. HTN: Well controlled. Continue home Lisinopril and Metoprolol   10. FEN/GI: KVO  D5/0.45% NS. Continue KDur. Regular Diet  Bowel regimen in place  Code Status: Full code DVT Prophylaxis: on Xarelto Family Communication: none Disposition Plan: home once pt is able to manage pain at home and hypoxia stable  Kalman Shan  Pager [phone]. If 7PM-7AM, please contact night-coverage.  01/05/2014, 11:12 AM  LOS: 4 days   Kalman Shan

## 2014-01-06 ENCOUNTER — Telehealth: Payer: Self-pay | Admitting: Internal Medicine

## 2014-01-06 ENCOUNTER — Other Ambulatory Visit: Payer: Self-pay | Admitting: Family Medicine

## 2014-01-06 ENCOUNTER — Telehealth: Payer: Self-pay

## 2014-01-06 DIAGNOSIS — I2699 Other pulmonary embolism without acute cor pulmonale: Secondary | ICD-10-CM | POA: Diagnosis not present

## 2014-01-06 DIAGNOSIS — R0902 Hypoxemia: Secondary | ICD-10-CM | POA: Diagnosis not present

## 2014-01-06 DIAGNOSIS — D5700 Hb-SS disease with crisis, unspecified: Secondary | ICD-10-CM | POA: Diagnosis not present

## 2014-01-06 DIAGNOSIS — I272 Other secondary pulmonary hypertension: Secondary | ICD-10-CM | POA: Diagnosis not present

## 2014-01-06 DIAGNOSIS — I5031 Acute diastolic (congestive) heart failure: Secondary | ICD-10-CM | POA: Diagnosis not present

## 2014-01-06 DIAGNOSIS — Z86711 Personal history of pulmonary embolism: Secondary | ICD-10-CM

## 2014-01-06 DIAGNOSIS — D571 Sickle-cell disease without crisis: Secondary | ICD-10-CM

## 2014-01-06 DIAGNOSIS — D72829 Elevated white blood cell count, unspecified: Secondary | ICD-10-CM | POA: Diagnosis not present

## 2014-01-06 DIAGNOSIS — R0602 Shortness of breath: Secondary | ICD-10-CM | POA: Diagnosis not present

## 2014-01-06 DIAGNOSIS — Z7901 Long term (current) use of anticoagulants: Secondary | ICD-10-CM

## 2014-01-06 LAB — CBC WITH DIFFERENTIAL/PLATELET
Basophils Absolute: 0.1 10*3/uL (ref 0.0–0.1)
Basophils Relative: 1 % (ref 0–1)
Eosinophils Absolute: 0.5 10*3/uL (ref 0.0–0.7)
Eosinophils Relative: 4 % (ref 0–5)
HEMATOCRIT: 22.7 % — AB (ref 39.0–52.0)
HEMOGLOBIN: 7.9 g/dL — AB (ref 13.0–17.0)
LYMPHS ABS: 2.9 10*3/uL (ref 0.7–4.0)
Lymphocytes Relative: 24 % (ref 12–46)
MCH: 33.2 pg (ref 26.0–34.0)
MCHC: 34.8 g/dL (ref 30.0–36.0)
MCV: 95.4 fL (ref 78.0–100.0)
Monocytes Absolute: 1.5 10*3/uL — ABNORMAL HIGH (ref 0.1–1.0)
Monocytes Relative: 12 % (ref 3–12)
NEUTROS ABS: 6.9 10*3/uL (ref 1.7–7.7)
Neutrophils Relative %: 59 % (ref 43–77)
Platelets: 416 10*3/uL — ABNORMAL HIGH (ref 150–400)
RBC: 2.38 MIL/uL — ABNORMAL LOW (ref 4.22–5.81)
RDW: 19.7 % — ABNORMAL HIGH (ref 11.5–15.5)
WBC: 11.8 10*3/uL — ABNORMAL HIGH (ref 4.0–10.5)

## 2014-01-06 LAB — RETICULOCYTES
RBC.: 2.38 MIL/uL — ABNORMAL LOW (ref 4.22–5.81)
RETIC COUNT ABSOLUTE: 176.1 10*3/uL (ref 19.0–186.0)
Retic Ct Pct: 7.4 % — ABNORMAL HIGH (ref 0.4–3.1)

## 2014-01-06 LAB — BASIC METABOLIC PANEL
Anion gap: 12 (ref 5–15)
BUN: 14 mg/dL (ref 6–23)
CALCIUM: 8.6 mg/dL (ref 8.4–10.5)
CHLORIDE: 105 meq/L (ref 96–112)
CO2: 22 meq/L (ref 19–32)
Creatinine, Ser: 0.61 mg/dL (ref 0.50–1.35)
GFR calc Af Amer: >90 mL/min (ref >90)
GFR calc non Af Amer: >90 mL/min (ref >90)
Glucose, Bld: 107 mg/dL — ABNORMAL HIGH (ref 70–99)
Potassium: 4.4 mEq/L (ref 3.7–5.3)
Sodium: 139 mEq/L (ref 137–147)

## 2014-01-06 LAB — LACTATE DEHYDROGENASE: LDH: 596 U/L — ABNORMAL HIGH (ref 94–250)

## 2014-01-06 MED ORDER — HYDROMORPHONE HCL 4 MG PO TABS
4.0000 mg | ORAL_TABLET | ORAL | Status: DC
  Administered 2014-01-06 (×2): 4 mg via ORAL
  Filled 2014-01-06 (×2): qty 1

## 2014-01-06 MED ORDER — HEPARIN SOD (PORK) LOCK FLUSH 100 UNIT/ML IV SOLN
500.0000 [IU] | INTRAVENOUS | Status: AC | PRN
  Administered 2014-01-06: 500 [IU]

## 2014-01-06 MED ORDER — RIVAROXABAN 20 MG PO TABS: 20.0000 mg | ORAL_TABLET | Freq: Every morning | ORAL | Status: DC

## 2014-01-06 NOTE — Telephone Encounter (Signed)
Left message for patient to call to schedule appointment to have labs drawn.

## 2014-01-06 NOTE — Telephone Encounter (Signed)
Refill request for dilaudid 4mg  and xarelto 20mg . LOV 08/22/2013. Please advise. Thanks!

## 2014-01-06 NOTE — Progress Notes (Signed)
SATURATION QUALIFICATIONS: (This note is used to comply with regulatory documentation for home oxygen)  Patient Saturations on Room Air at Rest = 96%  Patient Saturations on Room Air while Ambulating = 89-100%  Patient Saturations on  Liters of oxygen while Ambulating = %  Please briefly explain why patient needs home oxygen:

## 2014-01-06 NOTE — Plan of Care (Signed)
Problem: Discharge Progression Outcomes Goal: Barriers To Progression Addressed/Resolved Outcome: Completed/Met Date Met:  01/06/14 Goal: Discharge plan in place and appropriate Outcome: Completed/Met Date Met:  01/06/14 Goal: Pain controlled with appropriate interventions Outcome: Adequate for Discharge Goal: Hemodynamically stable Outcome: Completed/Met Date Met:  38/33/38 Goal: Complications resolved/controlled Outcome: Completed/Met Date Met:  01/06/14 Goal: Activity appropriate for discharge plan Outcome: Completed/Met Date Met:  01/06/14 Goal: Other Discharge Outcomes/Goals Outcome: Completed/Met Date Met:  01/06/14

## 2014-01-06 NOTE — Progress Notes (Addendum)
[name] witnessed the waste of 67ml dilaudid from high dose diluadid PCA syringe (2mg /ml) in sink.

## 2014-01-06 NOTE — Discharge Summary (Addendum)
Sickle-Cell Discharge Summary  [name] XAJ:[id number] DOB: August 25, 1980 DOA: 01/01/2014  PCP: [name],[name], MD  Admit date: 01/01/2014 Discharge date: 01/06/2014  Discharge Diagnoses:  Active Problems:   Sickle cell anemia with crisis   Pulmonary embolus   Demand ischemia   Discharge Condition: improved/stable  Disposition:      Follow-up Information    Follow up with [name],[name], MD In 3 days.   Specialty:  Internal Medicine   Why:  For lab only   Contact information:   [place] [address] [phone]       Diet:regular  Wt Readings from Last 3 Encounters:  01/01/14 160 lb 4.4 oz (72.7 kg)  12/23/13 165 lb (74.844 kg)  12/12/13 163 lb (73.936 kg)    History of present illness:  Pt with Hb SS who was hospitalized about 1 week ago and had SOB and Hypoxemia at that time. He had been without Xarelto for about 1 week at the time and a PE was suspected. He was advised to be evaluated for a PE, however he left AMA stating that he had to get home.   He has continued to have chest wall pain and has continued to be without the Xarelto. He presented today with C/O chest wall pain which he states is very typical of his Sickle Cell Pain. He describes the pain as throbbing in nature and localized to the left chest wall. The pain is sharp and throbbing in nature. He reports that the pain began 3 days ago and he was treating with his usual oral analgesics. However the pain escalated during the night last night and was unmanageable with oral medication so he came to the ED. He denies fever, chills, DOE. He does have more jaundice than his usual baseline  In the ED,he was also found to be hypoxic and tachycardic. Further evaluation by CTA showed a small PE. He also had elevated troponins and Pro-BNP with some T-wave inversion noted on EKG.   Hospital Course by problem:  Hypoxia/Demand ischemia/PE: Pt had an O2 sat at a nadir of 76% on RA on  presentation. He was given supplemental oxygen which raised O2 sat. He was shown to have a small PE on CT and was resumed on his home Xarelto. TTE on 12/3 showed pHTN (BNP=5,790), and was likely a contributing cause to hypoxia. He was evaluated by cardiology and was not thought to have CHF but demand ischemia with his increased troponin at the time. Troponin started to trend down after blood administration, see below. His oxygenation began to improve and he showed clinical improvement and supplemental oxygen was slowly weaned off. He maintained an O2 sat >95% on RA. He was able to maintain saturation with ambulation. Pt was instructed to seek medical attention if feeling weak, fatigued, SOB, CP, HA, and/or dizziness. Pt has previously missed several appointments in the pulmonary hypertension clinic. The importance of following up to get proper care for his pulmonary HTN was also stressed.   Anemia: Pt had a Hgb of 7.0 on admission. Due to his respiratory distress and demand ischemia, he was given 2U of PRBC as anemia was thought to be a contributing factor. His Hgb responded to 8.5 and was 7.9 at time of discharge. Pt has been scheduled to have his labs checked in 3 days by PCP. Pt advised to go to ED if feeling dizzy, worsening SOB, Heart racing/palpitations, and/or bleeding.  Sickle Cell Crisis: Patient presented with pain  characteristic of acute vaso-occlusive crisis. Pt's pain was treated with bolus IV analgesics initially and was later transitioned with a Dilaudid PCA and ketorolac. As his pain improved, Dilaudid PCA was titrated down and he was started on his home dose of Dilaudid. His pain was well controlled with his oral home regimen without much need for PCA. He had overall improvement of his pain and was physically functional upon discharge.   Hypotension: Pt had several days in negative fluid status. His BP went low at 90-93/43-52 on 01/05/14. He was given 563mL NS bolus and BP normalized and  maintained in normal ranges.   Discharge Exam: Filed Vitals:   01/06/14 0956  BP: 116/73  Pulse: 69  Temp: 98 F (36.7 C)  Resp: 18   Filed Vitals:   01/06/14 0337 01/06/14 0446 01/06/14 0900 01/06/14 0956  BP:  113/69  116/73  Pulse:  61  69  Temp:  98.1 F (36.7 C)  98 F (36.7 C)  TempSrc:  Oral  Axillary  Resp: 18 18 18 18   Height:      Weight:      SpO2: 93% 96% 98% 96%   General: Alert, awake, oriented x3, in no acute distress.  HEENT: [place]/AT PEERL, EOMI Neck: Trachea midline, no masses, no JVD, no carotid bruit, no LAD OROPHARYNX: Moist, No exudate/ erythema/lesions.  Heart: Regular rate and rhythm, without murmurs, rubs, gallops Lungs: Clear to auscultation, no wheezing or rhonchi noted.  Abdomen: Soft, nontender, nondistended, positive bowel sounds, no masses no hepatosplenomegaly noted. Neuro: No focal neurological deficits noted cranial nerves II through XII grossly intact. Strength 5 out of 5 in bilateral upper and lower extremities. Nl gait, ambulating without difficulty. Musculoskeletal: No warm swelling or erythema around joints, no spinal tenderness noted. Extremities: No c/c/e   Discharge Instructions Pick up needed meds at [facility] prior to leaving hospital. Follow-up on 01/09/13 for labs only at [facility]. Pt advised to read educational instructions and to seek medical attention if having the symptoms described there.     Medication List    TAKE these medications        folic acid 1 MG tablet  Commonly known as:  FOLVITE  Take 1 tablet (1 mg total) by mouth every morning.     HYDROmorphone 4 MG tablet  Commonly known as:  DILAUDID  Take 1 tablet (4 mg total) by mouth every 4 (four) hours as needed for severe pain.     hydroxyurea 500 MG capsule  Commonly known as:  HYDREA  Take 3 capsules (1,500 mg total) by mouth daily. May take with food to minimize GI side effects.     levofloxacin 750 MG tablet  Commonly known as:  LEVAQUIN  Take 1 tablet  (750 mg total) by mouth daily.     lisinopril 10 MG tablet  Commonly known as:  PRINIVIL,ZESTRIL  Take 1 tablet (10 mg total) by mouth daily.     metoprolol tartrate 25 MG tablet  Commonly known as:  LOPRESSOR  Take 1 tablet (25 mg total) by mouth daily.     morphine 30 MG 12 hr tablet  Commonly known as:  MS CONTIN  Take 1 tablet (30 mg total) by mouth every 12 (twelve) hours.     potassium chloride SA 20 MEQ tablet  Commonly known as:  K-DUR,KLOR-CON  Take 1 tablet (20 mEq total) by mouth every morning.     rivaroxaban 20 MG Tabs tablet  Commonly known as:  XARELTO  Take  1 tablet (20 mg total) by mouth every morning.     zolpidem 10 MG tablet  Commonly known as:  AMBIEN  Take 1 tablet (10 mg total) by mouth at bedtime as needed for sleep.          The results of significant diagnostics from this hospitalization (including imaging, microbiology, ancillary and laboratory) are listed below for reference.    Significant Diagnostic Studies: Dg Chest 2 View  12/23/2013   CLINICAL DATA:  Sickle cell crisis, rib pain  EXAM: CHEST  2 VIEW  COMPARISON:  12/22/2013  FINDINGS: LEFT subclavian power port with tip in SVC.  Enlargement of cardiac silhouette with pulmonary vascular congestion.  Moderate peribronchial thickening with accentuation of perihilar markings.  Minimal RIGHT base atelectasis.  Minimal scarring RIGHT mid lung and LEFT lower lobe.  No acute infiltrate, pleural effusion or pneumothorax.  Bones unremarkable.  IMPRESSION: Enlargement of cardiac silhouette with pulmonary vascular congestion.  Moderate bronchitic changes with RIGHT basilar atelectasis and scattered scarring.  No acute abnormalities.   Electronically Signed   By: Lavonia Dana M.D.   On: 12/23/2013 13:02   Dg Chest 2 View  12/22/2013   CLINICAL DATA:  Fever, weakness and body aches. History of sickle cell anemia.  EXAM: CHEST - 2 VIEW  COMPARISON:  10/30/2013  FINDINGS: Stable appearance of left-sided port  with catheter tip at the cavoatrial junction. Stable appearance of pulmonary scarring bilaterally. There is no evidence of pulmonary edema, consolidation, pneumothorax, nodule or pleural fluid. The heart size is stable and at the upper limits of normal. The visualized bony thorax is unremarkable.  IMPRESSION: No active disease.  Stable pulmonary scarring.   Electronically Signed   By: Aletta Edouard M.D.   On: 12/22/2013 12:27   Ct Angio Chest Pe W/cm &/or Wo Cm  01/01/2014   CLINICAL DATA:  Bilateral chest pain for 1 day. History of sickle cell anemia. Initial encounter.  EXAM: CT ANGIOGRAPHY CHEST WITH CONTRAST  TECHNIQUE: Multidetector CT imaging of the chest was performed using the standard protocol during bolus administration of intravenous contrast. Multiplanar CT image reconstructions and MIPs were obtained to evaluate the vascular anatomy.  CONTRAST:  134mL OMNIPAQUE IOHEXOL 350 MG/ML SOLN  COMPARISON:  Radiographs today.  Chest CT 11/25/2013.  FINDINGS: Vascular: The pulmonary arteries are well opacified with contrast. There is a tiny nonobstructing filling defect within a left upper lobe a pulmonary arterial subsegmental branch. This is best seen on thin section axial images 91-98 and appears new. No other pulmonary emboli are demonstrated. The central pulmonary arteries appear widely patent. There is stable dilatation the pulmonary arteries and the right cardiac chambers, but no signs of acute right heart strain. No significant atherosclerosis demonstrated.Cardiomegaly appears stable.  Mediastinum: There is stable nodularity within the anterior mediastinum consistent with residual thymic tissue. Small mediastinal and hilar lymph nodes are stable. The thyroid gland, trachea and esophagus demonstrate no significant findings. The left subclavian Port-A-Cath appears unchanged.  Lungs/Pleura: There is no pleural or pericardial effusion.There is stable chronic lung disease with basilar scarring and chronic  interstitial opacities. No superimposed airspace disease or suspicious pulmonary nodule.  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 No acute findings identified.  Musculoskeletal/Chest wall: No chest wall lesion or acute osseous findings.Chronic osseous changes of sickle cell in both humeral heads an the thoracic spine are stable.  Review of the MIP images confirms the above findings.  IMPRESSION: 1. Tiny focus  of nonocclusive thromboembolic disease in the left upper lobe. No central pulmonary emboli demonstrated. 2. Stable chronic dilatation of the pulmonary arteries and right cardiac chambers consistent with pulmonary arterial hypertension. No evidence of acute right heart strain. 3. Stable chronic lung disease. 4. 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 5. Critical Value/emergent results were called by telephone at the time of interpretation on 01/01/2014 at 10:46 am to Dr. Thurnell Garbe, who verbally acknowledged these results.   Electronically Signed   By: Camie Patience M.D.   On: 01/01/2014 10:47   Dg Chest Port 1 View  01/02/2014   CLINICAL DATA:  Hypoxia.  EXAM: PORTABLE CHEST - 1 VIEW  COMPARISON:  CT 12 2 2015.  Chest x-ray 12/23/2013.  FINDINGS: Power port catheter in good anatomic position. Severe cardiomegaly. Pulmonary venous congestion mild interstitial prominence suggesting congestive heart failure. Underlying chronic interstitial lung disease. No pleural effusion or pneumothorax. No acute bony abnormality.  IMPRESSION: 1. Power port catheter in good anatomic position. 2. Congestive heart failure with interstitial edema. Underlying chronic interstitial lung disease.   Electronically Signed   By: [place]   On: 01/02/2014 07:29   Dg Chest Port 1 View  01/01/2014   CLINICAL DATA:  Shortness of breath with hypoxia.  Sickle cell pain.  EXAM: PORTABLE CHEST - 1 VIEW  COMPARISON:  Radiographs 12/23/2013.  CT 11/25/2013.  FINDINGS: 0035  hr. The left subclavian Port-A-Cath is unchanged at the SVC right atrial junction. Cardiomegaly and chronic vascular congestion are unchanged. There is minimally increased basilar atelectasis compared with the most recent radiographs. There is no overt pulmonary edema, confluent airspace opacity or pleural effusion. Left humeral head avascular necrosis noted.  IMPRESSION: Stable cardiomegaly and chronic vascular congestion. Minimally increased bibasilar atelectasis without focal airspace disease.   Electronically Signed   By: Camie Patience M.D.   On: 01/01/2014 08:47    Microbiology: Recent Results (from the past 240 hour(s))  MRSA PCR Screening     Status: None   Collection Time: 01/02/14  2:33 PM  Result Value Ref Range Status   MRSA by PCR NEGATIVE NEGATIVE Final    Comment:        The GeneXpert MRSA Assay (FDA approved for NASAL specimens only), is one component of a comprehensive MRSA colonization surveillance program. It is not intended to diagnose MRSA infection nor to guide or monitor treatment for MRSA infections.      Labs: Basic Metabolic Panel:  Recent Labs Lab 01/01/14 0909 01/01/14 1207 01/02/14 0239 01/04/14 0450 01/06/14 0334  NA 138  --  143 143 139  K 3.8  --  4.3 4.4 4.4  CL 104  --  107 106 105  CO2 19  --  21 22 22   GLUCOSE 123*  --  107* 114* 107*  BUN 9  --  7 14 14   CREATININE 0.83  --  0.59 0.64 0.61  CALCIUM 8.8  --  8.9 9.2 8.6  MG  --  1.8  --   --   --    Liver Function Tests:  Recent Labs Lab 01/01/14 0909 01/02/14 0239  AST 51* 67*  ALT 25 31  ALKPHOS 83 85  BILITOT 7.6* 6.7*  PROT 7.4 7.4  ALBUMIN 4.0 4.0   No results for input(s): LIPASE, AMYLASE in the last 168 hours. No results for input(s): AMMONIA in the last 168 hours. CBC:  Recent Labs Lab 01/01/14 0909 01/02/14 0239 01/04/14 0450 01/05/14 0350 01/06/14 0334  WBC  21.5* 18.7* 16.4*  --  11.8*  NEUTROABS 16.6* 14.2* 11.1*  --  6.9  HGB 7.0* 8.5* 9.2* 8.7* 7.9*   HCT 19.3* 23.9* 26.0* 25.3* 22.7*  MCV 97.0 92.3 93.5  --  95.4  PLT 380 395 454*  --  416*   Cardiac Enzymes:  Recent Labs Lab 01/01/14 0909 01/01/14 1207 01/01/14 1823 01/02/14 0239 01/02/14 0930  TROPONINI 2.57* 7.81* 9.27* 6.90* 4.87*    Time coordinating discharge: >1  hour  Signed:  Kalman Shan Sickle Cell Service 01/06/2014, 2:10 PM

## 2014-01-09 ENCOUNTER — Encounter (HOSPITAL_COMMUNITY): Payer: Self-pay

## 2014-01-09 ENCOUNTER — Other Ambulatory Visit: Payer: Self-pay

## 2014-01-13 ENCOUNTER — Encounter: Payer: Self-pay | Admitting: Internal Medicine

## 2014-01-16 ENCOUNTER — Encounter: Payer: Self-pay | Admitting: Internal Medicine

## 2014-01-16 ENCOUNTER — Other Ambulatory Visit: Payer: Self-pay | Admitting: Internal Medicine

## 2014-01-16 DIAGNOSIS — I272 Pulmonary hypertension, unspecified: Secondary | ICD-10-CM

## 2014-01-16 NOTE — Progress Notes (Signed)
Patient ID: [name], male   DOB: 1980/02/18, 33 y.o.   MRN: [id number] A conference was convened today with [name], [facility]), [facility]) and myself. The purpose was to work with [name] in improving compliance with care for Sickle Cell Disease and complications including Pulmonary HTN.   The background on [name] is that he has Hb SS with now Pulmonary HTN. He has been poorly compliant with Hydrea and he has missed 3 appointments with  Cardiology.  His care has been escalating to need for the SDU on admission because of the complication of PAH increasing the severity of illness.   [name] has a poor understanding of the significance of PAH in the prognosis of his disease.   We spent some time on education about his disease. Although I still do not believe that he grasped the full understanding and gravity of his chrionic illness, he did verbalize some understanding that it is an important part of his survivability.  We also discussed the impact of medication non-compliance on his health. Particularly we discussed the recurrence of Pulmonary Emboli in a setting of non-adherence to Xarelto. This also contributes to his increased severity of illness and increase morbidity and mortality.  He has agreed to going to: 1.  Pulmonary HTN clinic. Will make referral.  2. He is also agreeable to working with SW from [facility] and [facility].  3. Pt will keep clinic appointments Quarterly  4. Pt will reach out for assistance to the agencies when he has circumstances that prevent him obtaining his medications- particularly Xarelto, Hydrea and analgesics.  [name],[name]

## 2014-01-20 ENCOUNTER — Ambulatory Visit: Payer: Self-pay | Admitting: Internal Medicine

## 2014-01-22 ENCOUNTER — Other Ambulatory Visit: Payer: Self-pay

## 2014-01-22 ENCOUNTER — Encounter (HOSPITAL_COMMUNITY): Payer: Self-pay

## 2014-01-22 ENCOUNTER — Telehealth (HOSPITAL_COMMUNITY): Payer: Self-pay | Admitting: Hematology

## 2014-01-22 ENCOUNTER — Non-Acute Institutional Stay (HOSPITAL_COMMUNITY)
Admission: AD | Admit: 2014-01-22 | Discharge: 2014-01-22 | Disposition: A | Discharge status: Home / Self Care | Payer: Medicare Other | Attending: Internal Medicine | Admitting: Internal Medicine

## 2014-01-22 ENCOUNTER — Telehealth (HOSPITAL_COMMUNITY): Payer: Self-pay | Admitting: Internal Medicine

## 2014-01-22 DIAGNOSIS — Z885 Allergy status to narcotic agent status: Secondary | ICD-10-CM | POA: Insufficient documentation

## 2014-01-22 DIAGNOSIS — I10 Essential (primary) hypertension: Secondary | ICD-10-CM

## 2014-01-22 DIAGNOSIS — Z79899 Other long term (current) drug therapy: Secondary | ICD-10-CM | POA: Diagnosis not present

## 2014-01-22 DIAGNOSIS — E876 Hypokalemia: Secondary | ICD-10-CM | POA: Insufficient documentation

## 2014-01-22 DIAGNOSIS — F39 Unspecified mood [affective] disorder: Secondary | ICD-10-CM | POA: Insufficient documentation

## 2014-01-22 DIAGNOSIS — D571 Sickle-cell disease without crisis: Secondary | ICD-10-CM

## 2014-01-22 DIAGNOSIS — R0781 Pleurodynia: Secondary | ICD-10-CM | POA: Diagnosis present

## 2014-01-22 DIAGNOSIS — Z9114 Patient's other noncompliance with medication regimen: Secondary | ICD-10-CM | POA: Diagnosis not present

## 2014-01-22 DIAGNOSIS — F1210 Cannabis abuse, uncomplicated: Secondary | ICD-10-CM | POA: Insufficient documentation

## 2014-01-22 DIAGNOSIS — D5700 Hb-SS disease with crisis, unspecified: Secondary | ICD-10-CM

## 2014-01-22 DIAGNOSIS — G894 Chronic pain syndrome: Secondary | ICD-10-CM

## 2014-01-22 LAB — BASIC METABOLIC PANEL WITH GFR
Anion gap: 6 (ref 5–15)
BUN: 9 mg/dL (ref 6–23)
CO2: 24 mmol/L (ref 19–32)
Calcium: 8.7 mg/dL (ref 8.4–10.5)
Chloride: 107 meq/L (ref 96–112)
Creatinine, Ser: 0.6 mg/dL (ref 0.50–1.35)
GFR calc Af Amer: >90 mL/min
GFR calc non Af Amer: >90 mL/min
Glucose, Bld: 97 mg/dL (ref 70–99)
Potassium: 3.9 mmol/L (ref 3.5–5.1)
Sodium: 137 mmol/L (ref 135–145)

## 2014-01-22 LAB — CBC WITH DIFFERENTIAL/PLATELET
Basophils Absolute: 0.1 K/uL (ref 0.0–0.1)
Basophils Relative: 1 % (ref 0–1)
Eosinophils Absolute: 0.4 K/uL (ref 0.0–0.7)
Eosinophils Relative: 3 % (ref 0–5)
HCT: 23.9 % — ABNORMAL LOW (ref 39.0–52.0)
Hemoglobin: 7.9 g/dL — ABNORMAL LOW (ref 13.0–17.0)
Lymphocytes Relative: 15 % (ref 12–46)
Lymphs Abs: 2 K/uL (ref 0.7–4.0)
MCH: 32.6 pg (ref 26.0–34.0)
MCHC: 33.1 g/dL (ref 30.0–36.0)
MCV: 98.8 fL (ref 78.0–100.0)
Monocytes Absolute: 1.5 K/uL — ABNORMAL HIGH (ref 0.1–1.0)
Monocytes Relative: 12 % (ref 3–12)
Neutro Abs: 9.2 K/uL — ABNORMAL HIGH (ref 1.7–7.7)
Neutrophils Relative %: 69 % (ref 43–77)
Platelets: 616 K/uL — ABNORMAL HIGH (ref 150–400)
RBC: 2.42 MIL/uL — ABNORMAL LOW (ref 4.22–5.81)
RDW: 18.4 % — ABNORMAL HIGH (ref 11.5–15.5)
WBC: 13.2 K/uL — ABNORMAL HIGH (ref 4.0–10.5)

## 2014-01-22 LAB — LACTATE DEHYDROGENASE: LDH: 406 U/L — AB (ref 94–250)

## 2014-01-22 LAB — RETICULOCYTES
RBC.: 2.42 MIL/uL — ABNORMAL LOW (ref 4.22–5.81)
RETIC COUNT ABSOLUTE: 249.3 10*3/uL — AB (ref 19.0–186.0)
RETIC CT PCT: 10.3 % — AB (ref 0.4–3.1)

## 2014-01-22 MED ORDER — HYDROMORPHONE HCL 4 MG PO TABS
4.0000 mg | ORAL_TABLET | Freq: Once | ORAL | Status: AC
  Administered 2014-01-22: 4 mg via ORAL
  Filled 2014-01-22: qty 1

## 2014-01-22 MED ORDER — MORPHINE SULFATE ER 30 MG PO TBCR: 30.0000 mg | EXTENDED_RELEASE_TABLET | Freq: Two times a day (BID) | ORAL | Status: DC

## 2014-01-22 MED ORDER — NALOXONE HCL 0.4 MG/ML IJ SOLN: 0.4000 mg | INTRAMUSCULAR | Status: DC | PRN

## 2014-01-22 MED ORDER — ONDANSETRON HCL 4 MG/2ML IJ SOLN: 4.0000 mg | Freq: Four times a day (QID) | INTRAMUSCULAR | Status: DC | PRN

## 2014-01-22 MED ORDER — HYDROMORPHONE 2 MG/ML HIGH CONCENTRATION IV PCA SOLN
INTRAVENOUS | Status: DC
  Administered 2014-01-22: 10.4 mg via INTRAVENOUS
  Administered 2014-01-22: 11:00:00 via INTRAVENOUS
  Filled 2014-01-22: qty 25

## 2014-01-22 MED ORDER — SODIUM CHLORIDE 0.9 % IJ SOLN
10.0000 mL | INTRAMUSCULAR | Status: AC | PRN
  Administered 2014-01-22: 10 mL

## 2014-01-22 MED ORDER — DIPHENHYDRAMINE HCL 25 MG PO CAPS
25.0000 mg | ORAL_CAPSULE | Freq: Four times a day (QID) | ORAL | Status: DC | PRN
Start: 2014-01-22 — End: 2014-01-22
  Administered 2014-01-22: 25 mg via ORAL
  Filled 2014-01-22: qty 1

## 2014-01-22 MED ORDER — SODIUM CHLORIDE 0.9 % IJ SOLN: 9.0000 mL | INTRAMUSCULAR | Status: DC | PRN

## 2014-01-22 MED ORDER — KETOROLAC TROMETHAMINE 30 MG/ML IJ SOLN
30.0000 mg | Freq: Once | INTRAMUSCULAR | Status: AC
  Administered 2014-01-22: 30 mg via INTRAVENOUS
  Filled 2014-01-22: qty 1

## 2014-01-22 MED ORDER — HYDROMORPHONE HCL 2 MG/ML IJ SOLN
2.2000 mg | Freq: Once | INTRAMUSCULAR | Status: AC
Start: 2014-01-22 — End: 2014-01-22
  Administered 2014-01-22: 2.2 mg via INTRAVENOUS
  Filled 2014-01-22: qty 2

## 2014-01-22 MED ORDER — HYDROMORPHONE HCL 2 MG/ML IJ SOLN
2.6000 mg | Freq: Once | INTRAMUSCULAR | Status: AC
  Administered 2014-01-22: 2.6 mg via INTRAVENOUS
  Filled 2014-01-22: qty 2

## 2014-01-22 MED ORDER — HEPARIN SOD (PORK) LOCK FLUSH 100 UNIT/ML IV SOLN
500.0000 [IU] | INTRAVENOUS | Status: AC | PRN
  Administered 2014-01-22: 500 [IU]
  Filled 2014-01-22: qty 5

## 2014-01-22 MED ORDER — LISINOPRIL 10 MG PO TABS: 10.0000 mg | ORAL_TABLET | Freq: Every day | ORAL | Status: DC

## 2014-01-22 MED ORDER — FOLIC ACID 1 MG PO TABS: 1.0000 mg | ORAL_TABLET | Freq: Every day | ORAL | Status: DC

## 2014-01-22 MED ORDER — HYDROMORPHONE HCL 2 MG/ML IJ SOLN
1.8000 mg | Freq: Once | INTRAMUSCULAR | Status: AC
  Administered 2014-01-22: 1.8 mg via INTRAVENOUS
  Filled 2014-01-22: qty 1

## 2014-01-22 MED ORDER — DEXTROSE-NACL 5-0.45 % IV SOLN
INTRAVENOUS | Status: DC
  Administered 2014-01-22: 10:00:00 via INTRAVENOUS

## 2014-01-22 NOTE — Telephone Encounter (Signed)
Prescription refill completed for lisinopril 10mg  tablets PO

## 2014-01-22 NOTE — Discharge Summary (Signed)
Physician Discharge Summary  [name] BJY:[id number] DOB: 1980/09/14 DOA: 01/22/2014  PCP: [name],[name], MD  Admit date: 01/22/2014 Discharge date: 01/22/2014  Discharge Diagnoses:  Active Problems: Hb SS with crisis   Discharge Condition:  Good  Disposition: Home  Diet: Regular  Wt Readings from Last 3 Encounters:  01/22/14 160 lb (72.576 kg)  01/01/14 160 lb 4.4 oz (72.7 kg)  12/23/13 165 lb (74.844 kg)    History of present illness:  Pt with Hb SS who presents with pain in ribs, back and legs x 2 days.He describes the pain as throbbing and consistent with the pain of sickle cell crisis. The pain is at an intensity of 7/10 and is non radiating. The pain was initially responding to oral analgesics but now exceeds the effect of the analgesic. He is admitted for management of pain of acute sickle cell crisis.   Hospital Course:  Pt was initially treated with weight based rapid re-dosing IVF and Toradol . The pain remianed above 6/10 and  pt was transitioned to a weight based Dilaudid PCA . Pain decreased to 5/10 and patient was given a dose of oral Dilaudid 4 mg.  Patient was re-evaluated for pain in the context of function and relationship to baseline and he was functional and his subjective pain level was close to baseline which he felt he could manage with his oral analgesics.   Pt also requested his MS Contin and a prescription for MS Contin 30 mg # 60 was issued at discharge.   Discharge Exam:  Filed Vitals:   01/22/14 1435  BP: 101/69  Pulse: 77  Temp: 98.6 F (37 C)  Resp: 20   Filed Vitals:   01/22/14 1128 01/22/14 1224 01/22/14 1332 01/22/14 1435  BP:  98/63 107/61 101/69  Pulse: 65 77 91 77  Temp:    98.6 F (37 C)  TempSrc:    Oral  Resp: 17 20 22 20   Weight:      SpO2: 96% 91% 92% 91%    General: Alert, awake, oriented x3, in no acute distress.  HEENT: [place]/AT PEERL, EOMI Neck: Trachea midline,  no masses, no thyromegal,y no JVD, no  carotid bruit OROPHARYNX:  Moist, No exudate/ erythema/lesions.  Heart: Regular rate and rhythm, without murmurs, rubs, gallops, PMI non-displaced, no heaves or thrills on palpation.  Lungs: Clear to auscultation, no wheezing or rhonchi noted. No increased vocal fremitus resonant to percussion  Abdomen: Soft, nontender, nondistended, positive bowel sounds, no masses no hepatosplenomegaly noted.  Neuro: No focal neurological deficits noted cranial nerves II through XII grossly intact. DTRs 2+ bilaterally upper and lower extremities. Strength at baseline in bilateral upper and lower extremities. Musculoskeletal: No warm swelling or erythema around joints, no spinal tenderness noted. Psychiatric: Patient alert and oriented x3, good insight and cognition, good recent to remote recall. Lymph node survey: No cervical axillary or inguinal lymphadenopathy noted.   Discharge Instructions  Discharge Instructions    Activity as tolerated - No restrictions    Complete by:  As directed      Diet general    Complete by:  As directed             Medication List    TAKE these medications        folic acid 1 MG tablet  Commonly known as:  FOLVITE  Take 1 tablet (1 mg total) by mouth every morning.     HYDROmorphone 4 MG tablet  Commonly known as:  DILAUDID  Take 1 tablet (4 mg total) by mouth every 4 (four) hours as needed for severe pain.     hydroxyurea 500 MG capsule  Commonly known as:  HYDREA  Take 3 capsules (1,500 mg total) by mouth daily. May take with food to minimize GI side effects.     levofloxacin 750 MG tablet  Commonly known as:  LEVAQUIN  Take 1 tablet (750 mg total) by mouth daily.     lisinopril 10 MG tablet  Commonly known as:  PRINIVIL,ZESTRIL  Take 1 tablet (10 mg total) by mouth daily.     metoprolol tartrate 25 MG tablet  Commonly known as:  LOPRESSOR  Take 1 tablet (25 mg total) by mouth daily.     morphine 30 MG 12 hr tablet  Commonly known as:  MS CONTIN   Take 1 tablet (30 mg total) by mouth every 12 (twelve) hours.     potassium chloride SA 20 MEQ tablet  Commonly known as:  K-DUR,KLOR-CON  Take 1 tablet (20 mEq total) by mouth every morning.     rivaroxaban 20 MG Tabs tablet  Commonly known as:  XARELTO  Take 1 tablet (20 mg total) by mouth every morning.     zolpidem 10 MG tablet  Commonly known as:  AMBIEN  Take 1 tablet (10 mg total) by mouth at bedtime as needed for sleep.          The results of significant diagnostics from this hospitalization (including imaging, microbiology, ancillary and laboratory) are listed below for reference.    Significant Diagnostic Studies: Ct Angio Chest Pe W/cm &/or Wo Cm  01/01/2014   CLINICAL DATA:  Bilateral chest pain for 1 day. History of sickle cell anemia. Initial encounter.  EXAM: CT ANGIOGRAPHY CHEST WITH CONTRAST  TECHNIQUE: Multidetector CT imaging of the chest was performed using the standard protocol during bolus administration of intravenous contrast. Multiplanar CT image reconstructions and MIPs were obtained to evaluate the vascular anatomy.  CONTRAST:  17mL OMNIPAQUE IOHEXOL 350 MG/ML SOLN  COMPARISON:  Radiographs today.  Chest CT 11/25/2013.  FINDINGS: Vascular: The pulmonary arteries are well opacified with contrast. There is a tiny nonobstructing filling defect within a left upper lobe a pulmonary arterial subsegmental branch. This is best seen on thin section axial images 91-98 and appears new. No other pulmonary emboli are demonstrated. The central pulmonary arteries appear widely patent. There is stable dilatation the pulmonary arteries and the right cardiac chambers, but no signs of acute right heart strain. No significant atherosclerosis demonstrated.Cardiomegaly appears stable.  Mediastinum: There is stable nodularity within the anterior mediastinum consistent with residual thymic tissue. Small mediastinal and hilar lymph nodes are stable. The thyroid gland, trachea and  esophagus demonstrate no significant findings. The left subclavian Port-A-Cath appears unchanged.  Lungs/Pleura: There is no pleural or pericardial effusion.There is stable chronic lung disease with basilar scarring and chronic interstitial opacities. No superimposed airspace disease or suspicious pulmonary nodule.  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 No acute findings identified.  Musculoskeletal/Chest wall: No chest wall lesion or acute osseous findings.Chronic osseous changes of sickle cell in both humeral heads an the thoracic spine are stable.  Review of the MIP images confirms the above findings.  IMPRESSION: 1. Tiny focus of nonocclusive thromboembolic disease in the left upper lobe. No central pulmonary emboli demonstrated. 2. Stable chronic dilatation of the pulmonary arteries and right cardiac chambers consistent with pulmonary arterial hypertension. No evidence of acute  right heart strain. 3. Stable chronic lung disease. 4. 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 5. Critical Value/emergent results were called by telephone at the time of interpretation on 01/01/2014 at 10:46 am to Dr. Thurnell Garbe, who verbally acknowledged these results.   Electronically Signed   By: Camie Patience M.D.   On: 01/01/2014 10:47   Dg Chest Port 1 View  01/02/2014   CLINICAL DATA:  Hypoxia.  EXAM: PORTABLE CHEST - 1 VIEW  COMPARISON:  CT 12 2 2015.  Chest x-ray 12/23/2013.  FINDINGS: Power port catheter in good anatomic position. Severe cardiomegaly. Pulmonary venous congestion mild interstitial prominence suggesting congestive heart failure. Underlying chronic interstitial lung disease. No pleural effusion or pneumothorax. No acute bony abnormality.  IMPRESSION: 1. Power port catheter in good anatomic position. 2. Congestive heart failure with interstitial edema. Underlying chronic interstitial lung disease.   Electronically Signed   By: [place]   On:  01/02/2014 07:29   Dg Chest Port 1 View  01/01/2014   CLINICAL DATA:  Shortness of breath with hypoxia.  Sickle cell pain.  EXAM: PORTABLE CHEST - 1 VIEW  COMPARISON:  Radiographs 12/23/2013.  CT 11/25/2013.  FINDINGS: 0035 hr. The left subclavian Port-A-Cath is unchanged at the SVC right atrial junction. Cardiomegaly and chronic vascular congestion are unchanged. There is minimally increased basilar atelectasis compared with the most recent radiographs. There is no overt pulmonary edema, confluent airspace opacity or pleural effusion. Left humeral head avascular necrosis noted.  IMPRESSION: Stable cardiomegaly and chronic vascular congestion. Minimally increased bibasilar atelectasis without focal airspace disease.   Electronically Signed   By: Camie Patience M.D.   On: 01/01/2014 08:47    Microbiology: No results found for this or any previous visit (from the past 240 hour(s)).   Labs: Basic Metabolic Panel:  Recent Labs Lab 01/22/14 1004  NA 137  K 3.9  CL 107  CO2 24  GLUCOSE 97  BUN 9  CREATININE 0.60  CALCIUM 8.7   Liver Function Tests: No results for input(s): AST, ALT, ALKPHOS, BILITOT, PROT, ALBUMIN in the last 168 hours. No results for input(s): LIPASE, AMYLASE in the last 168 hours. No results for input(s): AMMONIA in the last 168 hours. CBC:  Recent Labs Lab 01/22/14 1004  WBC 13.2*  NEUTROABS 9.2*  HGB 7.9*  HCT 23.9*  MCV 98.8  PLT 616*    Time coordinating discharge: 40 minutes  Signed:  [name],[name]  01/22/2014, 3:34 PM

## 2014-01-22 NOTE — Telephone Encounter (Signed)
Per Dr. Zigmund Daniel it is ok for patient to come to [facility].  Patient verbalizes understanding.

## 2014-01-22 NOTE — Progress Notes (Signed)
Pt discharged to home; discharge instructions given and signed; all questions answered; pt alert, oriented, states pain is 5/10 and he should be able to manage at home; pt states that he will catch public bus transport home; no complications noted

## 2014-01-22 NOTE — Telephone Encounter (Signed)
Pt called and stated experiencing side pains, rates pain 8/10; denies SOB, CP, N/V/D, and priapism; MD notified

## 2014-01-22 NOTE — H&P (Signed)
[facility] History and Physical  [name] WUJ:[id number] DOB: 12-23-1980 DOA: 01/22/2014   PCP: [name],[name], MD   Chief Complaint:  Pain in ribs, back and legs x 2 days  HPI: Pt with Hb SS who presents with pain in ribs, back and legs x 2 days.He describes the pain as throbbing and consistent with the pain of sickle cell crisis. The pain is at an intensity of 7/10 and is non radiating. The pain was initially responding to oral analgesics but now exceeds the effect of the analgesic. He is admitted for management of pain of acute sickle cell crisis.    Review of Systems:  Constitutional: No weight loss, night sweats, Fevers, chills, fatigue.  HEENT: No headaches, dizziness, seizures, vision changes, difficulty swallowing,Tooth/dental problems,Sore throat, No sneezing, itching, ear ache, nasal congestion, post nasal drip,  Cardio-vascular: No chest pain, Orthopnea, PND, swelling in lower extremities, anasarca, dizziness, palpitations  GI: No heartburn, indigestion, abdominal pain, nausea, vomiting, diarrhea, change in bowel habits, loss of appetite  Resp: No shortness of breath with exertion or at rest. No excess mucus, no productive cough, No non-productive cough, No coughing up of blood.No change in color of mucus.No wheezing.No chest wall deformity  Skin: no rash or lesions.  GU: no dysuria, change in color of urine, no urgency or frequency. No flank pain.  Psych: No change in mood or affect. No depression or anxiety. No memory loss.    Past Medical History  Diagnosis Date  . Sickle cell anemia   . Blood transfusion   . Acute embolism and thrombosis of right internal jugular vein   . Hypokalemia   . Mood disorder   . History of pulmonary embolus (PE)   . Avascular necrosis   . Leukocytosis     Chronic  . Thrombocytosis     Chronic  . Hypertension   . History of Clostridium difficile infection   . Uses marijuana   . Chronic anticoagulation   .  Functional asplenia   . Former smoker   . Second hand tobacco smoke exposure   . Alcohol consumption of one to four drinks per day   . Noncompliance with medication regimen   . Sickle-cell crisis with associated acute chest syndrome 05/13/2013  . Acute chest syndrome 06/18/2013  . Demand ischemia 01/02/2014   Past Surgical History  Procedure Laterality Date  . Right hip replacement      08/2006  . Cholecystectomy      01/2008  . Porta cath placement    . Porta cath removal    . Umbilical hernia repair      01/2008  . Excision of left periauricular cyst      10/2009  . Excision of right ear lobe cyst with primary closur      11/2007  . Portacath placement  01/05/2012    Procedure: INSERTION PORT-A-CATH;  Surgeon: Odis Hollingshead, MD;  Location: [facility];  Service: General;  Laterality: N/A;  ultrasound guiced port a cath insertion with fluoroscopy   Social History:  reports that he quit smoking about 3 years ago. He has never used smokeless tobacco. He reports that he uses illicit drugs (Marijuana) about twice per week. He reports that he does not drink alcohol.  Allergies  Allergen Reactions  . Morphine And Related Hives and Rash    Pt states he can take PO, but not IV and he is able to tolerate Dilaudid with no reactions.    Family History  Problem Relation Age of Onset  . Sickle cell anemia Mother   . Sickle cell anemia Father   . Sickle cell trait Brother     Prior to Admission medications   Medication Sig Start Date End Date Taking? Authorizing Provider  folic acid (FOLVITE) 1 MG tablet Take 1 tablet (1 mg total) by mouth every morning. 10/08/13   [name], MD  HYDROmorphone (DILAUDID) 4 MG tablet Take 1 tablet (4 mg total) by mouth every 4 (four) hours as needed for severe pain. 12/24/13   [name], MD  hydroxyurea (HYDREA) 500 MG capsule Take 3 capsules (1,500 mg total) by mouth daily. May take with food to minimize GI side effects. 10/30/13   [name], MD  levofloxacin (LEVAQUIN) 750 MG tablet Take 1 tablet (750 mg total) by mouth daily. Patient not taking: Reported on 12/22/2013 11/28/13   Elwyn Reach, MD  lisinopril (PRINIVIL,ZESTRIL) 10 MG tablet Take 1 tablet (10 mg total) by mouth daily. 11/19/13   [name], MD  metoprolol tartrate (LOPRESSOR) 25 MG tablet Take 1 tablet (25 mg total) by mouth daily. 08/22/13   [name], MD  morphine (MS CONTIN) 30 MG 12 hr tablet Take 1 tablet (30 mg total) by mouth every 12 (twelve) hours. 12/12/13   [name], MD  potassium chloride SA (K-DUR,KLOR-CON) 20 MEQ tablet Take 1 tablet (20 mEq total) by mouth every morning. 09/25/13   [name], MD  rivaroxaban (XARELTO) 20 MG TABS tablet Take 1 tablet (20 mg total) by mouth every morning. 01/06/14   [name], MD  zolpidem (AMBIEN) 10 MG tablet Take 1 tablet (10 mg total) by mouth at bedtime as needed for sleep. 10/28/13   Dorena Dew, FNP   Physical Exam: Filed Vitals:   01/22/14 0939  BP: 113/75  Pulse: 75  Temp: 98.6 F (37 C)  TempSrc: Oral  Resp: 20  Weight: 160 lb (72.576 kg)  SpO2: 100%   General: Alert, awake, oriented x3, in no acute distress.  HEENT: [place]/AT PEERL, EOMI Neck: Trachea midline,  no masses, no thyromegal,y no JVD, no carotid bruit OROPHARYNX:  Moist, No exudate/ erythema/lesions.  Heart: Regular rate and rhythm, without murmurs, rubs, gallops, PMI non-displaced, no heaves or thrills on palpation.  Lungs: Clear to auscultation, no wheezing or rhonchi noted. No increased vocal fremitus resonant to percussion  Abdomen: Soft, nontender, nondistended, positive bowel sounds, no masses no hepatosplenomegaly noted..  Neuro: No focal neurological deficits noted cranial nerves II through XII grossly intact.   Labs on Admission:   Basic Metabolic Panel: No results for input(s): NA, K, CL, CO2, GLUCOSE, BUN, CREATININE, CALCIUM, MG, PHOS in the last 168 hours. Liver  Function Tests: No results for input(s): AST, ALT, ALKPHOS, BILITOT, PROT, ALBUMIN in the last 168 hours. CBC: No results for input(s): WBC, NEUTROABS, HGB, HCT, MCV, PLT in the last 168 hours. BNP: Invalid input(s): POCBNP    Assessment/Plan: Active Problems: Hb SS with Crisis: Pt will be treated with initially treated with weight based rapid re-dosing IVF and Toradol . If pain is above 7/10 then Pt will be transitioned to a weight based Dilaudid PCA . Patient will be re-evaluated for pain in the context of function and relationship to baseline as care progresses.   Time spend: 36 minutes Code Status: Full Code Family Communication: N/A Disposition Plan: [place]., MD  Pager [phone]  If 7PM-7AM, please contact night-coverage  [email or web address] Password [email or web address] 01/22/2014, 9:45 AM

## 2014-01-25 IMAGING — CR DG CHEST 2V
2 series · 2 of 2 positions shown · non-contrast
Comparison: Multiple prior chest x-rays, most recently 02/08/2011.

CLINICAL DATA: Bilateral rib pain.  History of sickle cell disease.

CHEST - 2 VIEW

[w chest pa]
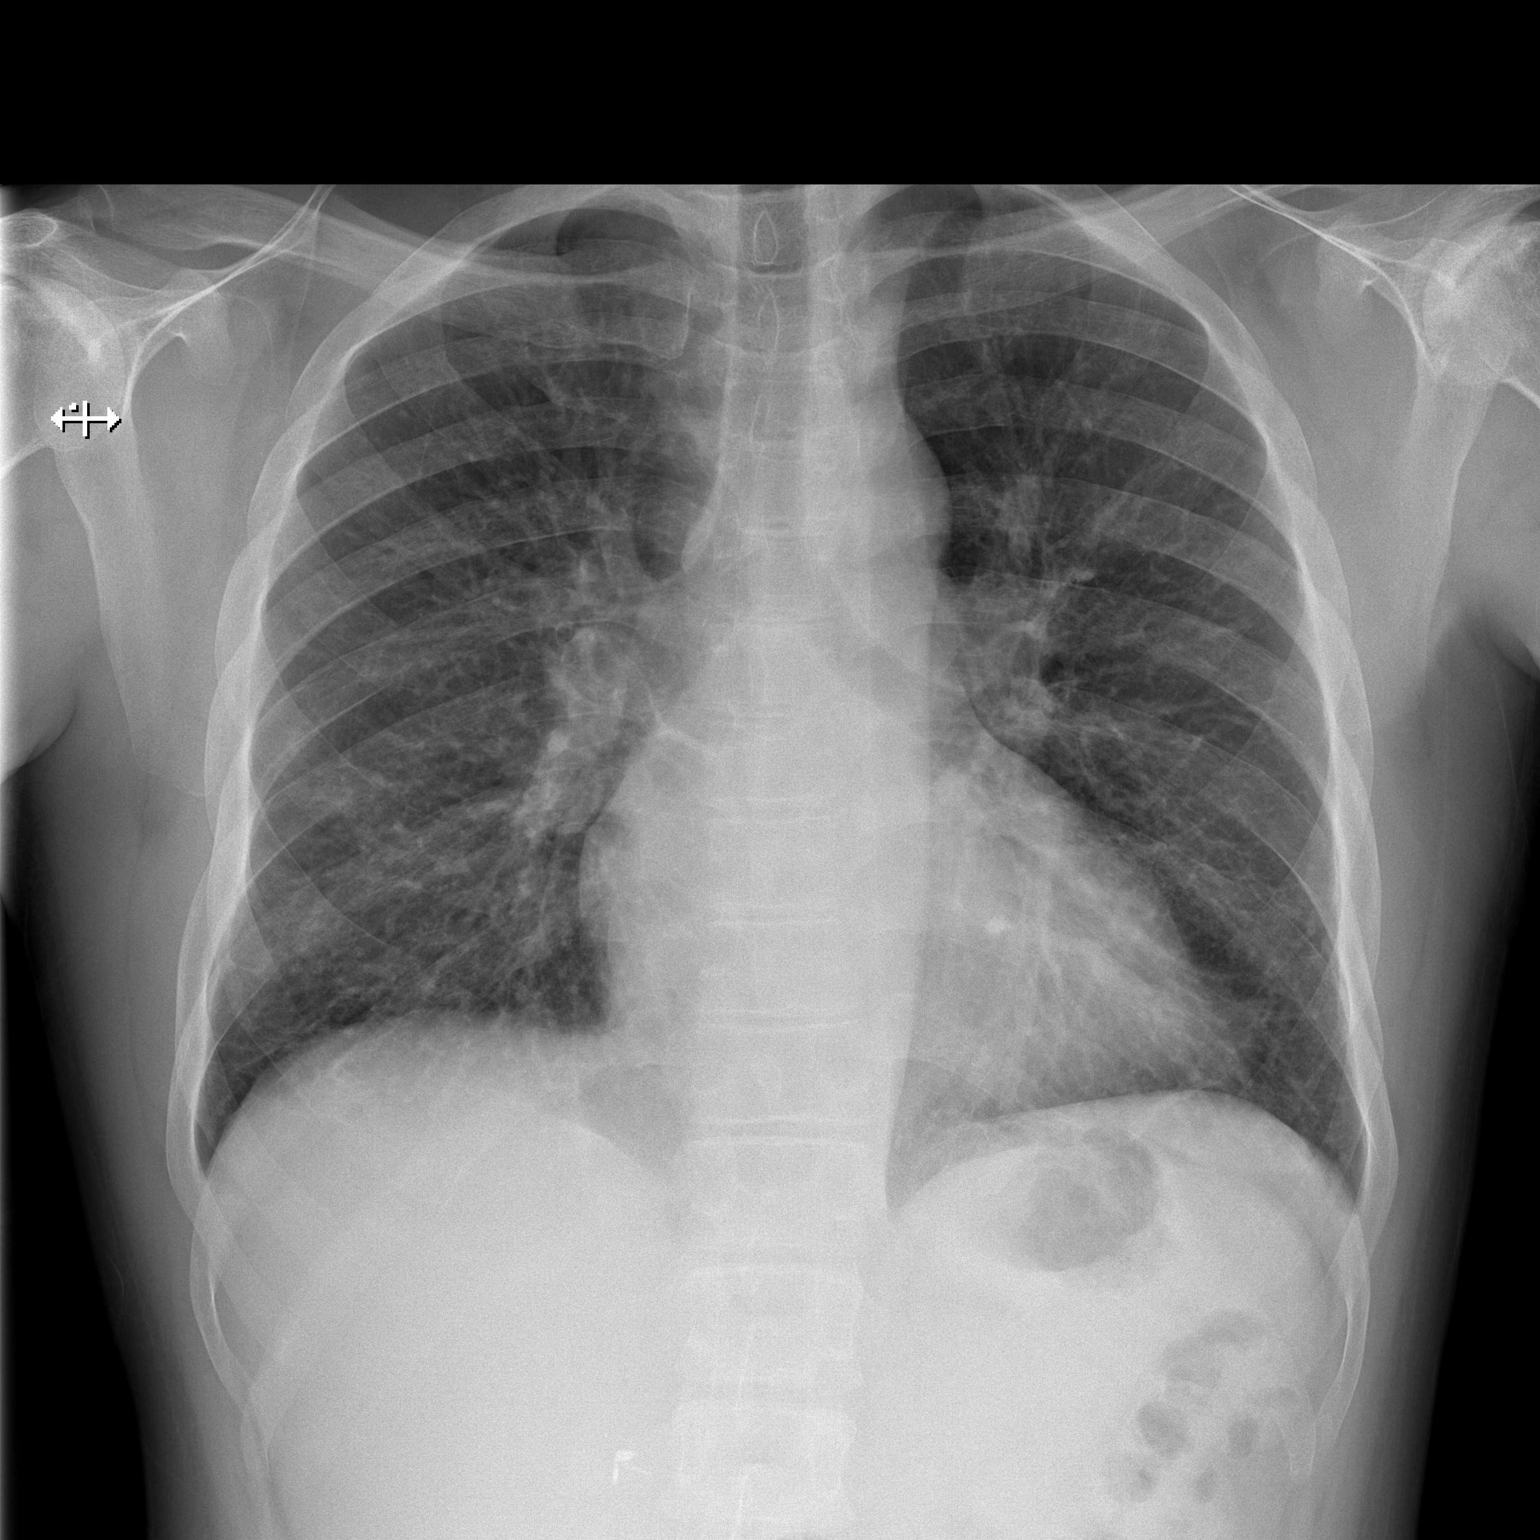

[w chest lat]
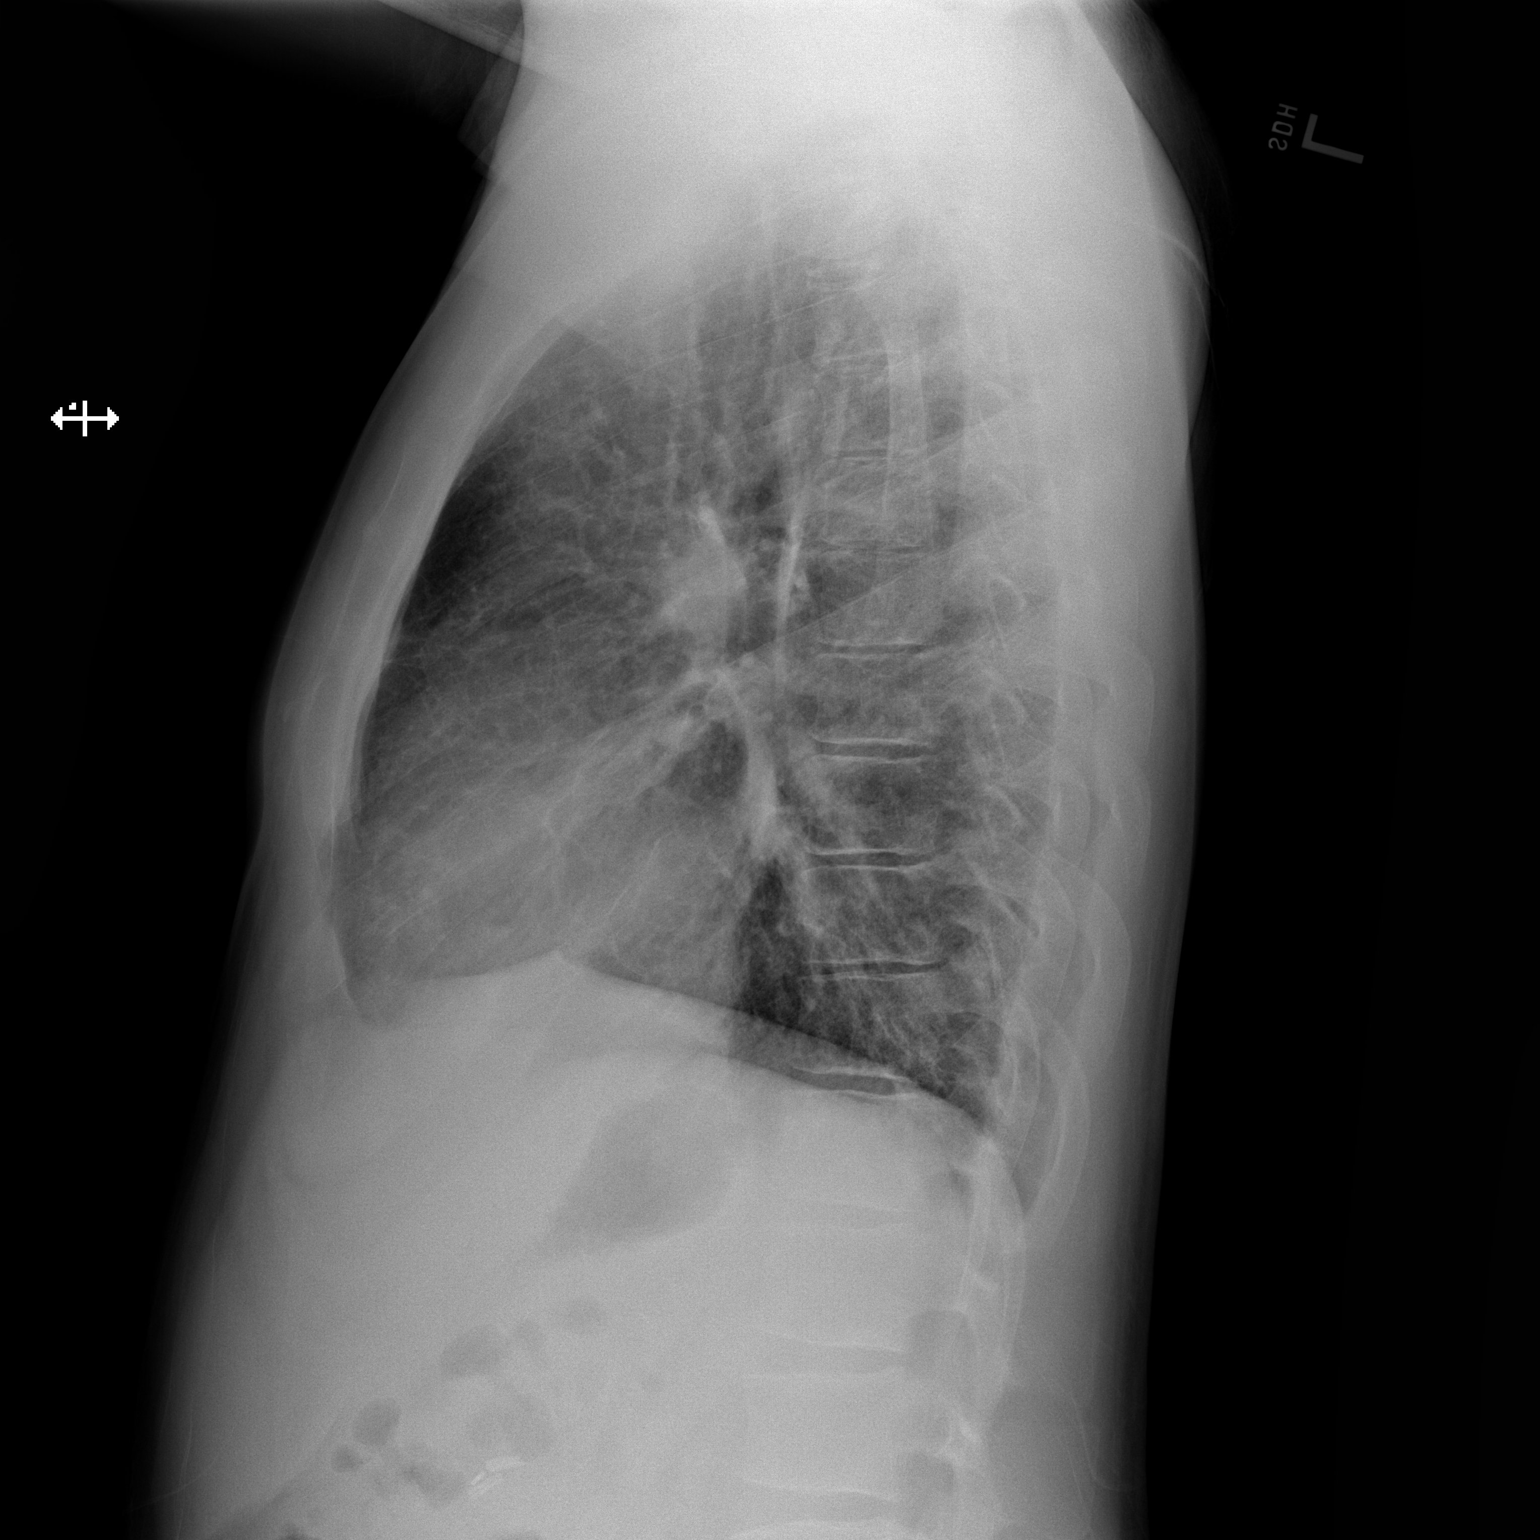

[2 of 2 positions shown; findings below may reference images not displayed]

FINDINGS: Previously noted right upper extremity PICC has been
removed.  Lung volumes are normal.  No consolidative airspace
disease.  There is diffuse interstitial prominence, which appears
similar to prior examinations, and likely reflects chronic lung
changes related to underlying sickle cell disease.  No pleural
effusions.  The heart size is mildly enlarged (unchanged).
Mediastinal contours are unremarkable. Surgical clips in right
upper quadrant abdomen.  Splenic contours is diminutive.
IMPRESSION: 1.  Diffuse interstitial prominence again noted, consistent with
areas of chronic lung scarring related to underlying sickle cell
disease.  No focal airspace opacities on today's examination to
suggest acute chest syndrome.
2.  Mild cardiomegaly unchanged.
3.  Status post cholecystectomy.

## 2014-01-26 IMAGING — CR DG CHEST 2V
2 series · 2 of 2 positions shown · non-contrast
Comparison: 03/04/2011

CLINICAL DATA: Chest pain.  Sickle cell disease.

CHEST - 2 VIEW

[w chest pa]
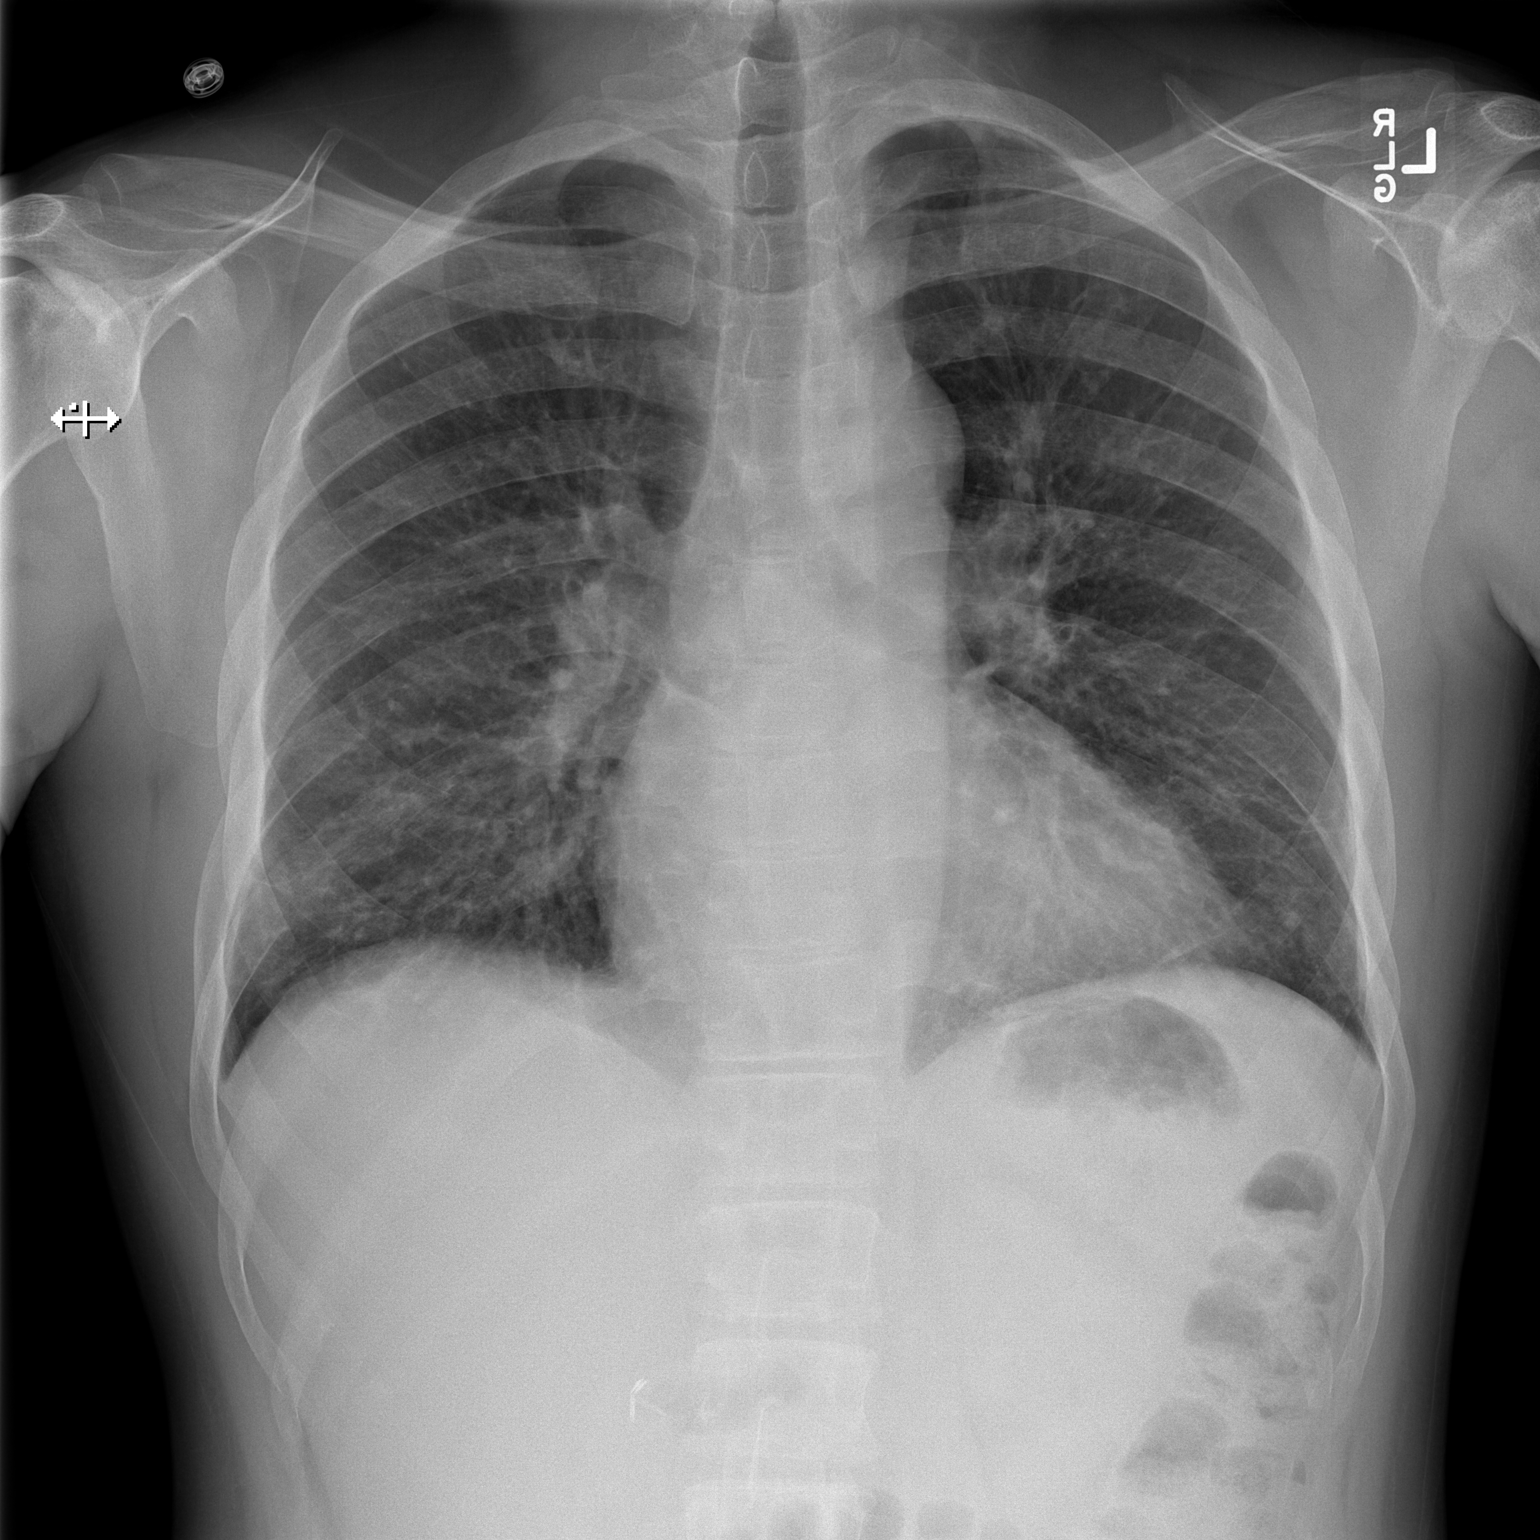

[w chest lat]
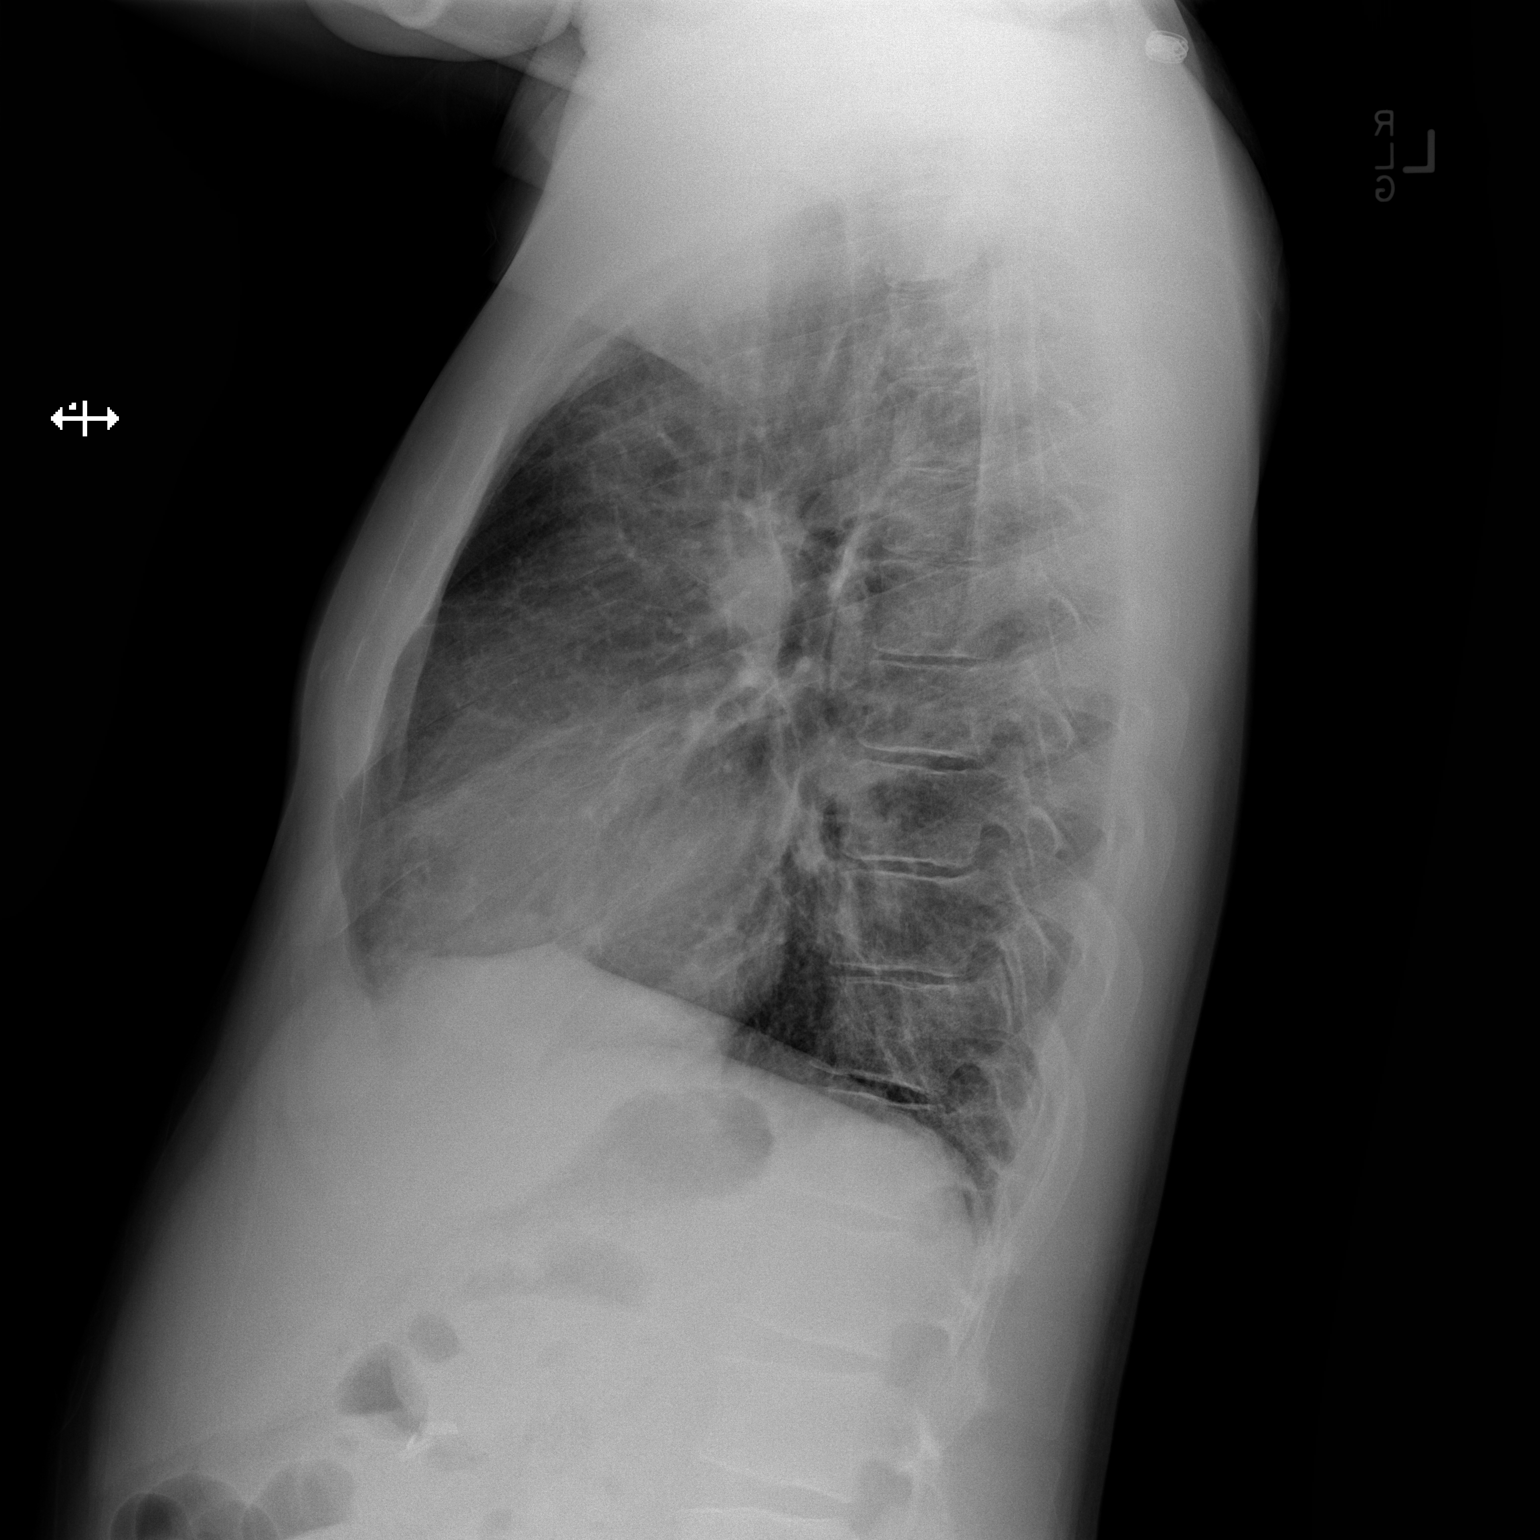

[2 of 2 positions shown; findings below may reference images not displayed]

FINDINGS: Heart size remains at upper limits of normal.  Diffuse
pulmonary interstitial prominence is again seen.  Mild bibasilar
scarring is stable.  There is no evidence of acute infiltrate or
pleural effusion.  No mass or lymphadenopathy identified.  Surgical
clips seen from prior cholecystectomy.
IMPRESSION: Stable borderline cardiomegaly and diffuse pulmonary interstitial
prominence.  No acute findings.

## 2014-01-29 ENCOUNTER — Telehealth (HOSPITAL_COMMUNITY): Payer: Self-pay | Admitting: Internal Medicine

## 2014-01-29 ENCOUNTER — Encounter (HOSPITAL_COMMUNITY): Payer: Self-pay

## 2014-01-29 ENCOUNTER — Observation Stay (HOSPITAL_COMMUNITY)
Admission: AD | Admit: 2014-01-29 | Discharge: 2014-01-29 | Disposition: A | Discharge status: Home / Self Care | Payer: Medicare Other | Attending: Internal Medicine | Admitting: Internal Medicine

## 2014-01-29 DIAGNOSIS — D5700 Hb-SS disease with crisis, unspecified: Principal | ICD-10-CM | POA: Diagnosis present

## 2014-01-29 DIAGNOSIS — F1290 Cannabis use, unspecified, uncomplicated: Secondary | ICD-10-CM | POA: Diagnosis not present

## 2014-01-29 DIAGNOSIS — I10 Essential (primary) hypertension: Secondary | ICD-10-CM | POA: Diagnosis not present

## 2014-01-29 DIAGNOSIS — Z87891 Personal history of nicotine dependence: Secondary | ICD-10-CM | POA: Diagnosis not present

## 2014-01-29 DIAGNOSIS — D57819 Other sickle-cell disorders with crisis, unspecified: Secondary | ICD-10-CM

## 2014-01-29 DIAGNOSIS — Z885 Allergy status to narcotic agent status: Secondary | ICD-10-CM | POA: Diagnosis not present

## 2014-01-29 DIAGNOSIS — Z7901 Long term (current) use of anticoagulants: Secondary | ICD-10-CM | POA: Diagnosis not present

## 2014-01-29 DIAGNOSIS — Z86711 Personal history of pulmonary embolism: Secondary | ICD-10-CM | POA: Insufficient documentation

## 2014-01-29 LAB — LACTATE DEHYDROGENASE: LDH: 380 U/L — ABNORMAL HIGH (ref 94–250)

## 2014-01-29 LAB — CBC WITH DIFFERENTIAL/PLATELET
Basophils Absolute: 0.1 10*3/uL (ref 0.0–0.1)
Basophils Relative: 1 % (ref 0–1)
EOS ABS: 0.3 10*3/uL (ref 0.0–0.7)
EOS PCT: 2 % (ref 0–5)
HCT: 23 % — ABNORMAL LOW (ref 39.0–52.0)
Hemoglobin: 7.7 g/dL — ABNORMAL LOW (ref 13.0–17.0)
LYMPHS ABS: 1.6 10*3/uL (ref 0.7–4.0)
Lymphocytes Relative: 12 % (ref 12–46)
MCH: 32.9 pg (ref 26.0–34.0)
MCHC: 33.5 g/dL (ref 30.0–36.0)
MCV: 98.3 fL (ref 78.0–100.0)
Monocytes Absolute: 1.5 10*3/uL — ABNORMAL HIGH (ref 0.1–1.0)
Monocytes Relative: 11 % (ref 3–12)
Neutro Abs: 10.2 10*3/uL — ABNORMAL HIGH (ref 1.7–7.7)
Neutrophils Relative %: 74 % (ref 43–77)
PLATELETS: 528 10*3/uL — AB (ref 150–400)
RBC: 2.34 MIL/uL — AB (ref 4.22–5.81)
RDW: 19.3 % — ABNORMAL HIGH (ref 11.5–15.5)
WBC: 13.8 10*3/uL — ABNORMAL HIGH (ref 4.0–10.5)

## 2014-01-29 LAB — RETICULOCYTES
RBC.: 2.34 MIL/uL — ABNORMAL LOW (ref 4.22–5.81)
Retic Count, Absolute: 231.7 10*3/uL — ABNORMAL HIGH (ref 19.0–186.0)
Retic Ct Pct: 9.9 % — ABNORMAL HIGH (ref 0.4–3.1)

## 2014-01-29 LAB — COMPREHENSIVE METABOLIC PANEL
ALK PHOS: 71 U/L (ref 39–117)
ALT: 23 U/L (ref 0–53)
AST: 35 U/L (ref 0–37)
Albumin: 4.4 g/dL (ref 3.5–5.2)
Anion gap: 4 — ABNORMAL LOW (ref 5–15)
BUN: 10 mg/dL (ref 6–23)
CHLORIDE: 108 meq/L (ref 96–112)
CO2: 22 mmol/L (ref 19–32)
Calcium: 8.9 mg/dL (ref 8.4–10.5)
Creatinine, Ser: 0.47 mg/dL — ABNORMAL LOW (ref 0.50–1.35)
GFR calc Af Amer: >90 mL/min (ref >90)
GFR calc non Af Amer: >90 mL/min (ref >90)
Glucose, Bld: 94 mg/dL (ref 70–99)
POTASSIUM: 3.7 mmol/L (ref 3.5–5.1)
Sodium: 134 mmol/L — ABNORMAL LOW (ref 135–145)
Total Bilirubin: 5.2 mg/dL — ABNORMAL HIGH (ref 0.3–1.2)
Total Protein: 7.9 g/dL (ref 6.0–8.3)

## 2014-01-29 MED ORDER — ONDANSETRON HCL 4 MG/2ML IJ SOLN: 4.0000 mg | Freq: Four times a day (QID) | INTRAMUSCULAR | Status: DC | PRN

## 2014-01-29 MED ORDER — SODIUM CHLORIDE 0.9 % IV SOLN: 12.5000 mg | Freq: Four times a day (QID) | INTRAVENOUS | Status: DC | PRN

## 2014-01-29 MED ORDER — DIPHENHYDRAMINE HCL 12.5 MG/5ML PO ELIX
12.5000 mg | ORAL_SOLUTION | Freq: Four times a day (QID) | ORAL | Status: DC | PRN
  Administered 2014-01-29: 12.5 mg via ORAL
  Filled 2014-01-29: qty 5

## 2014-01-29 MED ORDER — HYDROMORPHONE HCL 2 MG/ML IJ SOLN
1.8000 mg | Freq: Once | INTRAMUSCULAR | Status: AC
Start: 2014-01-29 — End: 2014-01-29
  Administered 2014-01-29: 1.8 mg via INTRAVENOUS
  Filled 2014-01-29: qty 1

## 2014-01-29 MED ORDER — HYDROMORPHONE HCL 4 MG PO TABS
4.0000 mg | ORAL_TABLET | ORAL | Status: DC
  Administered 2014-01-29: 4 mg via ORAL
  Filled 2014-01-29: qty 1

## 2014-01-29 MED ORDER — HYDROMORPHONE 2 MG/ML HIGH CONCENTRATION IV PCA SOLN
INTRAVENOUS | Status: DC
Start: 2014-01-29 — End: 2014-01-29
  Administered 2014-01-29: 14:00:00 via INTRAVENOUS
  Filled 2014-01-29: qty 25

## 2014-01-29 MED ORDER — HEPARIN SOD (PORK) LOCK FLUSH 100 UNIT/ML IV SOLN
500.0000 [IU] | INTRAVENOUS | Status: AC | PRN
  Administered 2014-01-29: 500 [IU]
  Filled 2014-01-29: qty 5

## 2014-01-29 MED ORDER — HYDROMORPHONE 2 MG/ML HIGH CONCENTRATION IV PCA SOLN
INTRAVENOUS | Status: AC
  Administered 2014-01-29: 9.8 mg via INTRAVENOUS
  Administered 2014-01-29: 2.8 mg via INTRAVENOUS

## 2014-01-29 MED ORDER — KETOROLAC TROMETHAMINE 30 MG/ML IJ SOLN
30.0000 mg | Freq: Four times a day (QID) | INTRAMUSCULAR | Status: DC
  Administered 2014-01-29: 30 mg via INTRAVENOUS
  Filled 2014-01-29: qty 1

## 2014-01-29 MED ORDER — DEXTROSE-NACL 5-0.45 % IV SOLN
INTRAVENOUS | Status: DC
  Administered 2014-01-29: 12:00:00 via INTRAVENOUS

## 2014-01-29 MED ORDER — SODIUM CHLORIDE 0.9 % IJ SOLN
10.0000 mL | INTRAMUSCULAR | Status: AC | PRN
  Administered 2014-01-29: 10 mL

## 2014-01-29 MED ORDER — NALOXONE HCL 0.4 MG/ML IJ SOLN: 0.4000 mg | INTRAMUSCULAR | Status: DC | PRN

## 2014-01-29 MED ORDER — HYDROMORPHONE HCL 2 MG/ML IJ SOLN
1.5000 mg | Freq: Once | INTRAMUSCULAR | Status: AC
  Administered 2014-01-29: 1.5 mg via INTRAVENOUS
  Filled 2014-01-29: qty 1

## 2014-01-29 MED ORDER — SODIUM CHLORIDE 0.9 % IJ SOLN: 9.0000 mL | INTRAMUSCULAR | Status: DC | PRN

## 2014-01-29 NOTE — Telephone Encounter (Signed)
Pt states having leg pain; rates pain 8/10; states taken home pain medications with no relief; denies SOB, CP, N/V/D, or priapism; informed patient that MD would be notified; verbalizes understanding; MD notified

## 2014-01-29 NOTE — Progress Notes (Signed)
Pt discharged to home; discharge instructions explained, given, and signed; all questions answered; port deaccessed with no complications noted; pt alert, oriented, and ambulatory; states pain level is 4/10 and pt states that he should be able to manage pain with home medications

## 2014-01-29 NOTE — Discharge Summary (Signed)
Physician Discharge Summary  [name] KGU:[id number] DOB: [birth year] DOA: 01/29/2014  PCP: [name],[name], MD  Admit date: 01/29/2014 Discharge date: 01/29/2014  Discharge Diagnoses:  Active Problems:   Sickle cell crisis   Discharge Condition: stable  Disposition: home Follow-up Information    Follow up with [name],[name], MD In 2 weeks.   Specialty:  Internal Medicine   Contact information:   [place] [address] [phone]       Diet:regular  Wt Readings from Last 3 Encounters:  01/29/14 170 lb (77.111 kg)  01/22/14 160 lb (72.576 kg)  01/01/14 160 lb 4.4 oz (72.7 kg)    History of present illness:  [name] is a 33yo male with sickle cell disease who presents to the day hospital with complaints of pain in both legs. He stated that the pain started yesterday. He tried taking his home Dilaudid but did not find much relief. He took his Dilaudid around 9am this morning and pain has gone from a 10/10 to 8/10. He states that it is likely crisis pain, although in a different location than usual (ribs). He denies HA, dizziness, fever, SOB, cough, weakness, vomiting, and diarrhea  Hospital Course:  Pt was treated initially with weight based rapid re-dosing Dilaudid, IVF, and Toradol. His pain was at a 8/10, so he was then transitioned to a weight based Dilaudid PCA . Labs were checked and her Hgb was stable around baseline. Patient was re-evaluated and appeared to be improving. He was monitored off IV Dilaudid and he rated his pain as a 4/10. Thus he was discharged home, to continue home pain management.  Discharge Exam:  Filed Vitals:   01/29/14 1528  BP: 95/67  Pulse: 82  Temp:   Resp: 23   Filed Vitals:   01/29/14 1126 01/29/14 1336 01/29/14 1428 01/29/14 1528  BP: 120/81  99/72 95/67  Pulse: 83 77 73 82  Temp: 98.4 F (36.9 C)     TempSrc: Oral     Resp: 20 16 18 23   Weight: 170 lb (77.111 kg)     SpO2: 97% 98%  97% 97%   General: Alert, awake, oriented x3, in no acute distress.  HEENT: [place]/AT PEERL, EOMI Neck: Trachea midline,  no masses, no thyromegal,y no JVD, no carotid bruit OROPHARYNX:  Moist, No exudate/ erythema/lesions.  Heart: Regular rate and rhythm, without murmurs, rubs, gallops Lungs: Clear to auscultation, no wheezing or rhonchi noted. No increased vocal fremitus resonant to percussion  Abdomen: Soft, nontender, nondistended, positive bowel sounds, no masses no hepatosplenomegaly noted..  Neuro: No focal neurological deficits noted cranial nerves II through XII grossly intact.  Strength 5 out of 5 in bilateral upper and lower extremities. Musculoskeletal: No warm swelling or erythema around joints, no spinal tenderness noted. Psychiatric: Patient alert and oriented x3    Discharge Instructions As above    Medication List    STOP taking these medications        levofloxacin 750 MG tablet  Commonly known as:  LEVAQUIN      TAKE these medications        folic acid 1 MG tablet  Commonly known as:  FOLVITE  Take 1 tablet (1 mg total) by mouth every morning.     HYDROmorphone 4 MG tablet  Commonly known as:  DILAUDID  Take 1 tablet (4 mg total) by mouth every 4 (four) hours as needed for severe pain.     hydroxyurea 500 MG capsule  Commonly known as:  HYDREA  Take 3 capsules (1,500 mg total) by mouth daily. May take with food to minimize GI side effects.     lisinopril 10 MG tablet  Commonly known as:  PRINIVIL,ZESTRIL  Take 1 tablet (10 mg total) by mouth daily.     metoprolol tartrate 25 MG tablet  Commonly known as:  LOPRESSOR  Take 1 tablet (25 mg total) by mouth daily.     morphine 30 MG 12 hr tablet  Commonly known as:  MS CONTIN  Take 1 tablet (30 mg total) by mouth every 12 (twelve) hours.     potassium chloride SA 20 MEQ tablet  Commonly known as:  K-DUR,KLOR-CON  Take 1 tablet (20 mEq total) by mouth every morning.     rivaroxaban 20 MG Tabs tablet   Commonly known as:  XARELTO  Take 1 tablet (20 mg total) by mouth every morning.     zolpidem 10 MG tablet  Commonly known as:  AMBIEN  Take 1 tablet (10 mg total) by mouth at bedtime as needed for sleep.          The results of significant diagnostics from this hospitalization (including imaging, microbiology, ancillary and laboratory) are listed below for reference.    Significant Diagnostic Studies: Ct Angio Chest Pe W/cm &/or Wo Cm  01/01/2014   CLINICAL DATA:  Bilateral chest pain for 1 day. History of sickle cell anemia. Initial encounter.  EXAM: CT ANGIOGRAPHY CHEST WITH CONTRAST  TECHNIQUE: Multidetector CT imaging of the chest was performed using the standard protocol during bolus administration of intravenous contrast. Multiplanar CT image reconstructions and MIPs were obtained to evaluate the vascular anatomy.  CONTRAST:  145mL OMNIPAQUE IOHEXOL 350 MG/ML SOLN  COMPARISON:  Radiographs today.  Chest CT 11/25/2013.  FINDINGS: Vascular: The pulmonary arteries are well opacified with contrast. There is a tiny nonobstructing filling defect within a left upper lobe a pulmonary arterial subsegmental branch. This is best seen on thin section axial images 91-98 and appears new. No other pulmonary emboli are demonstrated. The central pulmonary arteries appear widely patent. There is stable dilatation the pulmonary arteries and the right cardiac chambers, but no signs of acute right heart strain. No significant atherosclerosis demonstrated.Cardiomegaly appears stable.  Mediastinum: There is stable nodularity within the anterior mediastinum consistent with residual thymic tissue. Small mediastinal and hilar lymph nodes are stable. The thyroid gland, trachea and esophagus demonstrate no significant findings. The left subclavian Port-A-Cath appears unchanged.  Lungs/Pleura: There is no pleural or pericardial effusion.There is stable chronic lung disease with basilar scarring and chronic interstitial  opacities. No superimposed airspace disease or suspicious pulmonary nodule.  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 No acute findings identified.  Musculoskeletal/Chest wall: No chest wall lesion or acute osseous findings.Chronic osseous changes of sickle cell in both humeral heads an the thoracic spine are stable.  Review of the MIP images confirms the above findings.  IMPRESSION: 1. Tiny focus of nonocclusive thromboembolic disease in the left upper lobe. No central pulmonary emboli demonstrated. 2. Stable chronic dilatation of the pulmonary arteries and right cardiac chambers consistent with pulmonary arterial hypertension. No evidence of acute right heart strain. 3. Stable chronic lung disease. 4. 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 5. Critical Value/emergent results were called by telephone at the time of interpretation on 01/01/2014 at 10:46 am to Dr. Thurnell Garbe, who verbally acknowledged these results.   Electronically Signed   By: Rush Landmark  Lin Landsman M.D.   On: 01/01/2014 10:47   Dg Chest Port 1 View  01/02/2014   CLINICAL DATA:  Hypoxia.  EXAM: PORTABLE CHEST - 1 VIEW  COMPARISON:  CT 12 2 2015.  Chest x-ray 12/23/2013.  FINDINGS: Power port catheter in good anatomic position. Severe cardiomegaly. Pulmonary venous congestion mild interstitial prominence suggesting congestive heart failure. Underlying chronic interstitial lung disease. No pleural effusion or pneumothorax. No acute bony abnormality.  IMPRESSION: 1. Power port catheter in good anatomic position. 2. Congestive heart failure with interstitial edema. Underlying chronic interstitial lung disease.   Electronically Signed   By: Stone   On: 01/02/2014 07:29   Dg Chest Port 1 View  01/01/2014   CLINICAL DATA:  Shortness of breath with hypoxia.  Sickle cell pain.  EXAM: PORTABLE CHEST - 1 VIEW  COMPARISON:  Radiographs 12/23/2013.  CT 11/25/2013.  FINDINGS: 0035 hr. The left  subclavian Port-A-Cath is unchanged at the SVC right atrial junction. Cardiomegaly and chronic vascular congestion are unchanged. There is minimally increased basilar atelectasis compared with the most recent radiographs. There is no overt pulmonary edema, confluent airspace opacity or pleural effusion. Left humeral head avascular necrosis noted.  IMPRESSION: Stable cardiomegaly and chronic vascular congestion. Minimally increased bibasilar atelectasis without focal airspace disease.   Electronically Signed   By: Camie Patience M.D.   On: 01/01/2014 08:47    Microbiology: No results found for this or any previous visit (from the past 240 hour(s)).   Labs: Basic Metabolic Panel:  Recent Labs Lab 01/29/14 1151  NA 134*  K 3.7  CL 108  CO2 22  GLUCOSE 94  BUN 10  CREATININE 0.47*  CALCIUM 8.9   Liver Function Tests:  Recent Labs Lab 01/29/14 1151  AST 35  ALT 23  ALKPHOS 71  BILITOT 5.2*  PROT 7.9  ALBUMIN 4.4   No results for input(s): LIPASE, AMYLASE in the last 168 hours. No results for input(s): AMMONIA in the last 168 hours. CBC:  Recent Labs Lab 01/29/14 1151  WBC 13.8*  NEUTROABS 10.2*  HGB 7.7*  HCT 23.0*  MCV 98.3  PLT 528*    Time coordinating discharge:40 min  Signed:  Kalman Shan  01/29/2014, 5:33 PM

## 2014-01-29 NOTE — H&P (Signed)
[place] History and Physical  Gerald GOPAL [name] DOB: October 29, 1980 DOA: 01/29/2014   PCP: [name],[name], MD   Chief Complaint: pain  [name] is a 33yo male with sickle cell disease who presents to the day hospital with complaints of pain in both legs. He stated that the pain started yesterday. He tried taking his home Dilaudid but did not find much relief. He took his Dilaudid around 9am this morning and pain has gone from a 10/10 to 8/10. He states that it is likely crisis pain, although in a different location than usual (ribs). He denies HA, dizziness, fever, SOB, cough, weakness, vomiting, and diarrhea.    Review of Systems:  Constitutional: No weight loss, night sweats, fevers, chills, fatigue.  HEENT: No headaches, dizziness, seizures, vision changes, difficulty swallowing,Tooth/dental problems,Sore throat, No sneezing, itching, ear ache, nasal congestion, post nasal drip,  Cardio-vascular: No chest pain, Orthopnea, PND, swelling in lower extremities, anasarca, dizziness, palpitations  GI: No heartburn, indigestion, abdominal pain, nausea, vomiting, diarrhea, change in bowel habits, loss of appetite  Resp: No shortness of breath with exertion or at rest. No excess mucus, no productive cough, No non-productive cough, No coughing up of blood.No change in color of mucus.No wheezing.No chest wall deformity  Skin: no rash or lesions.  GU: no dysuria, change in color of urine, no urgency or frequency. No flank pain.  Musculoskeletal: No joint pain or swelling. No decreased range of motion. No back pain.  Psych: No change in mood or affect. No depression or anxiety. No memory loss.    Past Medical History  Diagnosis Date  . Sickle cell anemia   . Blood transfusion   . Acute embolism and thrombosis of right internal jugular vein   . Hypokalemia   . Mood disorder   . History of pulmonary embolus (PE)   . Avascular necrosis   .  Leukocytosis     Chronic  . Thrombocytosis     Chronic  . Hypertension   . History of Clostridium difficile infection   . Uses marijuana   . Chronic anticoagulation   . Functional asplenia   . Former smoker   . Second hand tobacco smoke exposure   . Alcohol consumption of one to four drinks per day   . Noncompliance with medication regimen   . Sickle-cell crisis with associated acute chest syndrome 05/13/2013  . Acute chest syndrome 06/18/2013  . Demand ischemia 01/02/2014   Past Surgical History  Procedure Laterality Date  . Right hip replacement      08/2006  . Cholecystectomy      01/2008  . Porta cath placement    . Porta cath removal    . Umbilical hernia repair      01/2008  . Excision of left periauricular cyst      10/2009  . Excision of right ear lobe cyst with primary closur      11/2007  . Portacath placement  01/05/2012    Procedure: INSERTION PORT-A-CATH;  Surgeon: Odis Hollingshead, MD;  Location: [facility];  Service: General;  Laterality: N/A;  ultrasound guiced port a cath insertion with fluoroscopy   Social History:  reports that he quit smoking about 3 years ago. He has never used smokeless tobacco. He reports that he uses illicit drugs (Marijuana) about twice per week. He reports that he does not drink alcohol.  Allergies  Allergen Reactions  . Morphine And Related Hives and Rash    Pt states he  can take PO, but not IV and he is able to tolerate Dilaudid with no reactions.    Family History  Problem Relation Age of Onset  . Sickle cell anemia Mother   . Sickle cell anemia Father   . Sickle cell trait Brother     Prior to Admission medications   Medication Sig Start Date End Date Taking? Authorizing Provider  folic acid (FOLVITE) 1 MG tablet Take 1 tablet (1 mg total) by mouth every morning. 10/08/13  Yes [name], MD  HYDROmorphone (DILAUDID) 4 MG tablet Take 1 tablet (4 mg total) by mouth every 4 (four) hours as needed for severe pain. 12/24/13   Yes [name], MD  hydroxyurea (HYDREA) 500 MG capsule Take 3 capsules (1,500 mg total) by mouth daily. May take with food to minimize GI side effects. 10/30/13  Yes [name], MD  lisinopril (PRINIVIL,ZESTRIL) 10 MG tablet Take 1 tablet (10 mg total) by mouth daily. 01/22/14  Yes [name], MD  metoprolol tartrate (LOPRESSOR) 25 MG tablet Take 1 tablet (25 mg total) by mouth daily. 08/22/13  Yes [name], MD  morphine (MS CONTIN) 30 MG 12 hr tablet Take 1 tablet (30 mg total) by mouth every 12 (twelve) hours. 01/22/14  Yes [name], MD  potassium chloride SA (K-DUR,KLOR-CON) 20 MEQ tablet Take 1 tablet (20 mEq total) by mouth every morning. 09/25/13  Yes [name], MD  rivaroxaban (XARELTO) 20 MG TABS tablet Take 1 tablet (20 mg total) by mouth every morning. 01/06/14  Yes [name], MD  zolpidem (AMBIEN) 10 MG tablet Take 1 tablet (10 mg total) by mouth at bedtime as needed for sleep. 10/28/13  Yes Dorena Dew, FNP  levofloxacin (LEVAQUIN) 750 MG tablet Take 1 tablet (750 mg total) by mouth daily. Patient not taking: Reported on 12/22/2013 11/28/13   Elwyn Reach, MD   Physical Exam: Filed Vitals:   01/29/14 1126 01/29/14 1336 01/29/14 1428 01/29/14 1528  BP: 120/81  99/72 95/67  Pulse: 83 77 73 82  Temp: 98.4 F (36.9 C)     TempSrc: Oral     Resp: 20 16 18 23   Weight: 170 lb (77.111 kg)     SpO2: 97% 98% 97% 97%   General: Alert, awake, oriented x3, in no acute distress.  HEENT: [place]/AT PEERL, EOMI Neck: Trachea midline,  no masses, no thyromegal,y no JVD, no carotid bruit OROPHARYNX:  Moist, No exudate/ erythema/lesions.  Heart: Regular rate and rhythm, without murmurs, rubs, gallops, PMI non-displaced, no heaves or thrills on palpation.  Lungs: Clear to auscultation, no wheezing or rhonchi noted. No increased vocal fremitus resonant to percussion  Abdomen: Soft, nontender, nondistended, positive bowel sounds,  no masses no hepatosplenomegaly noted..  Neuro: No focal neurological deficits noted cranial nerves II through XII grossly intact.   Labs on Admission:   Basic Metabolic Panel:  Recent Labs Lab 01/29/14 1151  NA 134*  K 3.7  CL 108  CO2 22  GLUCOSE 94  BUN 10  CREATININE 0.47*  CALCIUM 8.9   Liver Function Tests:  Recent Labs Lab 01/29/14 1151  AST 35  ALT 23  ALKPHOS 71  BILITOT 5.2*  PROT 7.9  ALBUMIN 4.4   CBC:  Recent Labs Lab 01/29/14 1151  WBC 13.8*  NEUTROABS 10.2*  HGB 7.7*  HCT 23.0*  MCV 98.3  PLT 528*   BNP: Invalid input(s): POCBNP   Radiological Exams on Admission: No  results found.   Assessment/Plan: Active Problems:   Sickle cell crisis Pt will be treated with initially treated with weight based rapid re-dosing IVF and Toradol . If pain is above 7/10 then Pt will be transitioned to a weight based Dilaudid PCA . Patient will be re-evaluated for pain in the context of function and relationship to baseline as care progresses.  Time spend: 30 min Code Status: full Family Communication:  none Disposition Plan: pending improvement  Kalman Shan, MD  Pager [phone]  If 7PM-7AM, please contact night-coverage [email or web address] Password [email or web address] 01/29/2014, 3:58 PM

## 2014-01-29 NOTE — Telephone Encounter (Signed)
Notified patient that, per MD, he may come to day hospital for evaluation; pt verbalizes understanding

## 2014-02-02 ENCOUNTER — Emergency Department (HOSPITAL_COMMUNITY)
Admission: EM | Admit: 2014-02-02 | Discharge: 2014-02-02 | Discharge status: Against Medical Advice | Payer: Medicare Other | Attending: Emergency Medicine | Admitting: Emergency Medicine

## 2014-02-02 ENCOUNTER — Encounter (HOSPITAL_COMMUNITY): Payer: Self-pay | Admitting: Emergency Medicine

## 2014-02-02 DIAGNOSIS — Z8619 Personal history of other infectious and parasitic diseases: Secondary | ICD-10-CM | POA: Insufficient documentation

## 2014-02-02 DIAGNOSIS — Z8659 Personal history of other mental and behavioral disorders: Secondary | ICD-10-CM | POA: Insufficient documentation

## 2014-02-02 DIAGNOSIS — Z79899 Other long term (current) drug therapy: Secondary | ICD-10-CM | POA: Insufficient documentation

## 2014-02-02 DIAGNOSIS — D5700 Hb-SS disease with crisis, unspecified: Secondary | ICD-10-CM | POA: Insufficient documentation

## 2014-02-02 DIAGNOSIS — Z86711 Personal history of pulmonary embolism: Secondary | ICD-10-CM | POA: Diagnosis not present

## 2014-02-02 DIAGNOSIS — Z87891 Personal history of nicotine dependence: Secondary | ICD-10-CM | POA: Diagnosis not present

## 2014-02-02 DIAGNOSIS — Q8901 Asplenia (congenital): Secondary | ICD-10-CM | POA: Diagnosis not present

## 2014-02-02 DIAGNOSIS — E876 Hypokalemia: Secondary | ICD-10-CM | POA: Diagnosis not present

## 2014-02-02 DIAGNOSIS — Z9119 Patient's noncompliance with other medical treatment and regimen: Secondary | ICD-10-CM | POA: Insufficient documentation

## 2014-02-02 DIAGNOSIS — Z7901 Long term (current) use of anticoagulants: Secondary | ICD-10-CM | POA: Diagnosis not present

## 2014-02-02 DIAGNOSIS — I10 Essential (primary) hypertension: Secondary | ICD-10-CM | POA: Diagnosis not present

## 2014-02-02 LAB — CBC WITH DIFFERENTIAL/PLATELET
Basophils Absolute: 0.1 10*3/uL (ref 0.0–0.1)
Basophils Relative: 1 % (ref 0–1)
Eosinophils Absolute: 0.4 10*3/uL (ref 0.0–0.7)
Eosinophils Relative: 3 % (ref 0–5)
HCT: 20 % — ABNORMAL LOW (ref 39.0–52.0)
Hemoglobin: 6.8 g/dL — CL (ref 13.0–17.0)
Lymphocytes Relative: 15 % (ref 12–46)
Lymphs Abs: 2 10*3/uL (ref 0.7–4.0)
MCH: 33.3 pg (ref 26.0–34.0)
MCHC: 34 g/dL (ref 30.0–36.0)
MCV: 98 fL (ref 78.0–100.0)
Monocytes Absolute: 1.4 10*3/uL — ABNORMAL HIGH (ref 0.1–1.0)
Monocytes Relative: 11 % (ref 3–12)
Neutro Abs: 9.1 10*3/uL — ABNORMAL HIGH (ref 1.7–7.7)
Neutrophils Relative %: 70 % (ref 43–77)
Platelets: 375 10*3/uL (ref 150–400)
RBC: 2.04 MIL/uL — ABNORMAL LOW (ref 4.22–5.81)
RDW: 21.3 % — ABNORMAL HIGH (ref 11.5–15.5)
WBC: 13 10*3/uL — ABNORMAL HIGH (ref 4.0–10.5)

## 2014-02-02 LAB — COMPREHENSIVE METABOLIC PANEL
ALT: 33 U/L (ref 0–53)
AST: 42 U/L — ABNORMAL HIGH (ref 0–37)
Albumin: 4.3 g/dL (ref 3.5–5.2)
Alkaline Phosphatase: 83 U/L (ref 39–117)
Anion gap: 5 (ref 5–15)
BUN: 10 mg/dL (ref 6–23)
CO2: 22 mmol/L (ref 19–32)
Calcium: 8.7 mg/dL (ref 8.4–10.5)
Chloride: 110 mEq/L (ref 96–112)
Creatinine, Ser: 0.36 mg/dL — ABNORMAL LOW (ref 0.50–1.35)
GFR calc Af Amer: >90 mL/min (ref >90)
GFR calc non Af Amer: >90 mL/min (ref >90)
Glucose, Bld: 100 mg/dL — ABNORMAL HIGH (ref 70–99)
Potassium: 3.8 mmol/L (ref 3.5–5.1)
Sodium: 137 mmol/L (ref 135–145)
Total Bilirubin: 5.6 mg/dL — ABNORMAL HIGH (ref 0.3–1.2)
Total Protein: 7.6 g/dL (ref 6.0–8.3)

## 2014-02-02 LAB — RETICULOCYTES
RBC.: 2.04 MIL/uL — ABNORMAL LOW (ref 4.22–5.81)
Retic Count, Absolute: 308 10*3/uL — ABNORMAL HIGH (ref 19.0–186.0)
Retic Ct Pct: 15.1 % — ABNORMAL HIGH (ref 0.4–3.1)

## 2014-02-02 MED ORDER — HYDROMORPHONE HCL 1 MG/ML IJ SOLN
1.0000 mg | Freq: Once | INTRAMUSCULAR | Status: AC
  Administered 2014-02-02: 1 mg via INTRAVENOUS
  Filled 2014-02-02: qty 1

## 2014-02-02 MED ORDER — HYDROMORPHONE HCL 2 MG/ML IJ SOLN
2.0000 mg | Freq: Once | INTRAMUSCULAR | Status: AC
  Administered 2014-02-02: 2 mg via INTRAVENOUS
  Filled 2014-02-02: qty 1

## 2014-02-02 MED ORDER — DIPHENHYDRAMINE HCL 50 MG/ML IJ SOLN
25.0000 mg | Freq: Once | INTRAMUSCULAR | Status: AC
  Administered 2014-02-02: 25 mg via INTRAVENOUS
  Filled 2014-02-02: qty 1

## 2014-02-02 MED ORDER — SODIUM CHLORIDE 0.9 % IJ SOLN
INTRAMUSCULAR | Status: AC
  Filled 2014-02-02: qty 10

## 2014-02-02 MED ORDER — HEPARIN SOD (PORK) LOCK FLUSH 100 UNIT/ML IV SOLN
500.0000 [IU] | Freq: Once | INTRAVENOUS | Status: AC
  Administered 2014-02-02: 500 [IU]
  Filled 2014-02-02: qty 5

## 2014-02-02 MED ORDER — HYDROMORPHONE HCL 2 MG/ML IJ SOLN: 2.0000 mg | Freq: Once | INTRAMUSCULAR | Status: DC

## 2014-02-02 NOTE — ED Provider Notes (Signed)
Patient signed out to me by [place], PA-C.  Patient here with sickle cell pain.  Plan is to re-evaluate after last dose of pain medications.    5:06 PM Patient still having pain. He has had 4 rounds of dilaudid. I have strongly advised the patient to be admitted to the hospital.  Patient's hgb is 6.8 today.  He may benefit from a transfusion.  I advised the patient that we would be able to better control his pain in the hospital.  Patient refuses to be admitted to the hospital.  States that he just wants to eat his sandwich and go home.   Patients that they would like to leave.  I have discussed my concerns with the patient about them leaving without completing the evaluation.    Patient presents with sickle cell pain  Symptoms include: side, low back, and leg pain  Concern for: sickle cell crisis  Study limitations and other tests offered include: offered admission and transfusion  Treatment and recommended follow-up include: offered admission and transfusion and pain control in the hospital.  I do not feel that the patient should leave prior to completing their workup. I have discussed the above symptoms, initial findings, study limitations, and treatment plan with the patient. Patient is not altered, does not have any distracting issues, and has capacity to make decisions for themselves. Patient places themselves at risk of worsening pain, vaso-occlusive crisis, worsening symptoms, disability, morbidity and/or death.    Montine Circle, PA-C 02/02/14 [place], MD 02/04/14 [phone]

## 2014-02-02 NOTE — Discharge Instructions (Signed)
Sickle Cell Anemia, Adult °Sickle cell anemia is a condition in which red blood cells have an abnormal "sickle" shape. This abnormal shape shortens the cells' life span, which results in a lower than normal concentration of red blood cells in the blood. The sickle shape also causes the cells to clump together and block free blood flow through the blood vessels. As a result, the tissues and organs of the body do not receive enough oxygen. Sickle cell anemia causes organ damage and pain and increases the risk of infection. °CAUSES  °Sickle cell anemia is a genetic disorder. Those who receive two copies of the gene have the condition, and those who receive one copy have the trait. °RISK FACTORS °The sickle cell gene is most common in people whose families originated in [place]. Other areas of the globe where sickle cell trait occurs include the [place], [place] and [place], the [place], and the [place].  °SIGNS AND SYMPTOMS °· Pain, especially in the extremities, back, chest, or abdomen (common). The pain may start suddenly or may develop following an illness, especially if there is dehydration. Pain can also occur due to overexertion or exposure to extreme temperature changes. °· Frequent severe bacterial infections, especially certain types of pneumonia and meningitis. °· Pain and swelling in the hands and feet. °· Decreased activity.   °· Loss of appetite.   °· Change in behavior. °· Headaches. °· Seizures. °· Shortness of breath or difficulty breathing. °· Vision changes. °· Skin ulcers. °Those with the trait may not have symptoms or they may have mild symptoms.  °DIAGNOSIS  °Sickle cell anemia is diagnosed with blood tests that demonstrate the genetic trait. It is often diagnosed during the newborn period, due to mandatory testing nationwide. A variety of blood tests, X-rays, CT scans, MRI scans, ultrasounds, and lung function tests may also be done to monitor the condition. °TREATMENT  °Sickle  cell anemia may be treated with: °· Medicines. You may be given pain medicines, antibiotic medicines (to treat and prevent infections) or medicines to increase the production of certain types of hemoglobin. °· Fluids. °· Oxygen. °· Blood transfusions. °HOME CARE INSTRUCTIONS  °· Drink enough fluid to keep your urine clear or pale yellow. Increase your fluid intake in hot weather and during exercise. °· Do not smoke. Smoking lowers oxygen levels in the blood.   °· Only take over-the-counter or prescription medicines for pain, fever, or discomfort as directed by your health care provider. °· Take antibiotics as directed by your health care provider. Make sure you finish them it even if you start to feel better.   °· Take supplements as directed by your health care provider.   °· Consider wearing a medical alert bracelet. This tells anyone caring for you in an emergency of your condition.   °· When traveling, keep your medical information, health care provider's names, and the medicines you take with you at all times.   °· If you develop a fever, do not take medicines to reduce the fever right away. This could cover up a problem that is developing. Notify your health care provider. °· Keep all follow-up appointments with your health care provider. Sickle cell anemia requires regular medical care. °SEEK MEDICAL CARE IF: ° You have a fever. °SEEK IMMEDIATE MEDICAL CARE IF:  °· You feel dizzy or faint.   °· You have new abdominal pain, especially on the left side near the stomach area.   °· You develop a persistent, often uncomfortable and painful penile erection (priapism). If this is not treated immediately it   will lead to impotence.   °· You have numbness your arms or legs or you have a hard time moving them.   °· You have a hard time with speech.   °· You have a fever or persistent symptoms for more than 2-3 days.   °· You have a fever and your symptoms suddenly get worse.   °· You have signs or symptoms of infection.  These include:   °¨ Chills.   °¨ Abnormal tiredness (lethargy).   °¨ Irritability.   °¨ Poor eating.   °¨ Vomiting.   °· You develop pain that is not helped with medicine.   °· You develop shortness of breath. °· You have pain in your chest.   °· You are coughing up pus-like or bloody sputum.   °· You develop a stiff neck. °· Your feet or hands swell or have pain. °· Your abdomen appears bloated. °· You develop joint pain. °MAKE SURE YOU: °· Understand these instructions. °Document Released: 04/27/2005 Document Revised: 06/03/2013 Document Reviewed: 08/29/2012 °[employer]® Patient Information ©2015 [employer]. This information is not intended to replace advice given to you by your health care provider. Make sure you discuss any questions you have with your health care provider. ° °

## 2014-02-02 NOTE — ED Provider Notes (Signed)
CSN: [id number]     Arrival date & time 02/02/14  1214 History   First MD Initiated Contact with Patient 02/02/14 1336     Chief Complaint  Patient presents with  . Sickle Cell Pain Crisis     (Consider location/radiation/quality/duration/timing/severity/associated sxs/prior Treatment) HPI DEMERIUS PODOLAK is a 34 y.o. male history of sickle cell anemia, acute chest syndrome, pulmonary embolus comes in for evaluation of sickle cell pain crisis. Patient states this morning he began to experience pain in the sides of his ribs and on the front of his upper legs. He tried taking his home MS Contin and Dilaudid without relief. He rates his pain as an 8/10. He denies fevers, cough, shortness of breath, overt chest pain, abdominal pain. No other modifying factors Reports he has been taking his Xarelto as directed  Past Medical History  Diagnosis Date  . Sickle cell anemia   . Blood transfusion   . Acute embolism and thrombosis of right internal jugular vein   . Hypokalemia   . Mood disorder   . History of pulmonary embolus (PE)   . Avascular necrosis   . Leukocytosis     Chronic  . Thrombocytosis     Chronic  . Hypertension   . History of Clostridium difficile infection   . Uses marijuana   . Chronic anticoagulation   . Functional asplenia   . Former smoker   . Second hand tobacco smoke exposure   . Alcohol consumption of one to four drinks per day   . Noncompliance with medication regimen   . Sickle-cell crisis with associated acute chest syndrome 05/13/2013  . Acute chest syndrome 06/18/2013  . Demand ischemia 01/02/2014   Past Surgical History  Procedure Laterality Date  . Right hip replacement      08/2006  . Cholecystectomy      01/2008  . Porta cath placement    . Porta cath removal    . Umbilical hernia repair      01/2008  . Excision of left periauricular cyst      10/2009  . Excision of right ear lobe cyst with primary closur      11/2007  . Portacath placement   01/05/2012    Procedure: INSERTION PORT-A-CATH;  Surgeon: Odis Hollingshead, MD;  Location: [facility];  Service: General;  Laterality: N/A;  ultrasound guiced port a cath insertion with fluoroscopy   Family History  Problem Relation Age of Onset  . Sickle cell anemia Mother   . Sickle cell anemia Father   . Sickle cell trait Brother    History  Substance Use Topics  . Smoking status: Former Smoker -- 13 years    Quit date: 07/08/2010  . Smokeless tobacco: Never Used  . Alcohol Use: No    Review of Systems A 10 point review of systems was completed and was negative except for pertinent positives and negatives as mentioned in the history of present illness     Allergies  Morphine and related  Home Medications   Prior to Admission medications   Medication Sig Start Date End Date Taking? Authorizing Provider  folic acid (FOLVITE) 1 MG tablet Take 1 tablet (1 mg total) by mouth every morning. 10/08/13  Yes [name], MD  HYDROmorphone (DILAUDID) 4 MG tablet Take 1 tablet (4 mg total) by mouth every 4 (four) hours as needed for severe pain. 12/24/13  Yes [name], MD  hydroxyurea (HYDREA) 500 MG capsule Take 3 capsules (1,500  mg total) by mouth daily. May take with food to minimize GI side effects. 10/30/13  Yes [name], MD  lisinopril (PRINIVIL,ZESTRIL) 10 MG tablet Take 1 tablet (10 mg total) by mouth daily. 01/22/14  Yes [name], MD  metoprolol tartrate (LOPRESSOR) 25 MG tablet Take 1 tablet (25 mg total) by mouth daily. 08/22/13  Yes [name], MD  morphine (MS CONTIN) 30 MG 12 hr tablet Take 1 tablet (30 mg total) by mouth every 12 (twelve) hours. 01/22/14  Yes [name], MD  potassium chloride SA (K-DUR,KLOR-CON) 20 MEQ tablet Take 1 tablet (20 mEq total) by mouth every morning. 09/25/13  Yes [name], MD  rivaroxaban (XARELTO) 20 MG TABS tablet Take 1 tablet (20 mg total) by mouth every morning. 01/06/14  Yes  [name], MD  zolpidem (AMBIEN) 10 MG tablet Take 1 tablet (10 mg total) by mouth at bedtime as needed for sleep. 10/28/13  Yes Dorena Dew, FNP   BP 140/90 mmHg  Pulse 92  Temp(Src) 98.7 F (37.1 C) (Oral)  Resp 20  SpO2 94% Physical Exam  Constitutional: He is oriented to person, place, and time. He appears well-developed and well-nourished.  HENT:  Head: Normocephalic and atraumatic.  Mouth/Throat: Oropharynx is clear and moist.  Eyes: Conjunctivae are normal. Pupils are equal, round, and reactive to light. Right eye exhibits no discharge. Left eye exhibits no discharge. No scleral icterus.  Neck: Neck supple.  Cardiovascular: Normal rate, regular rhythm and normal heart sounds.   Pulmonary/Chest: Effort normal and breath sounds normal. No respiratory distress. He has no wheezes. He has no rales.  Abdominal: Soft. There is no tenderness.  Musculoskeletal: He exhibits no tenderness.  Neurological: He is alert and oriented to person, place, and time.  Cranial Nerves II-XII grossly intact  Skin: Skin is warm and dry. No rash noted.  Psychiatric: He has a normal mood and affect.  Nursing note and vitals reviewed.   ED Course  Procedures (including critical care time) Labs Review Labs Reviewed  CBC WITH DIFFERENTIAL - Abnormal; Notable for the following:    WBC 13.0 (*)    RBC 2.04 (*)    Hemoglobin 6.8 (*)    HCT 20.0 (*)    RDW 21.3 (*)    Neutro Abs 9.1 (*)    Monocytes Absolute 1.4 (*)    All other components within normal limits  COMPREHENSIVE METABOLIC PANEL - Abnormal; Notable for the following:    Glucose, Bld 100 (*)    Creatinine, Ser 0.36 (*)    AST 42 (*)    Total Bilirubin 5.6 (*)    All other components within normal limits  RETICULOCYTES - Abnormal; Notable for the following:    Retic Ct Pct 15.1 (*)    RBC. 2.04 (*)    Retic Count, Manual 308.0 (*)    All other components within normal limits    Imaging Review No results found.    EKG Interpretation None     Meds given in ED:  Medications  sodium chloride 0.9 % injection (not administered)  HYDROmorphone (DILAUDID) injection 2 mg (not administered)  HYDROmorphone (DILAUDID) injection 1 mg (1 mg Intravenous Given 02/02/14 1410)  HYDROmorphone (DILAUDID) injection 2 mg (2 mg Intravenous Given 02/02/14 1537)  HYDROmorphone (DILAUDID) injection 2 mg (2 mg Intravenous Given 02/02/14 1629)  diphenhydrAMINE (BENADRYL) injection 25 mg (25 mg Intravenous Given 02/02/14 1629)    New Prescriptions   No medications on file  Filed Vitals:   02/02/14 1220 02/02/14 1521  BP: 120/73 140/90  Pulse: 97 92  Temp: 98.4 F (36.9 C) 98.7 F (37.1 C)  TempSrc: Oral Oral  Resp: 18 20  SpO2: 95% 94%    MDM  [name] TIPPS is a 34 y.o. male history of sickle cell anemia comes in for evaluation of pain crisis. Pain is typical pattern of his sickle cell pain on the outside of his bilateral ribs and anterior aspect of his thighs. Denies fevers, cough, chest pain,shortness of breath.  Vitals stable - WNL -afebrile Pt is anemic to 6.8, down from 7.7 five days ago. Pt resting comfortably in ED. Patient states he feels like he will be able to return home after this third round of analgesia. Reports he has home medications that should be able to keep pain under control.  PE--benign lung exam not concerning further acute or emergent pathology.  DDX--no evidence of acute chest at this time. Patient experiencing sickle cell pain crisis similar to previous.   Care transferred over to Montine Circle, PA-C to continue patient's care. Discussed new lower hemoglobin and potential need for admission.   Final diagnoses:  Sickle cell anemia with pain        Verl Dicker, PA-C 02/03/14 1117  Virgel Manifold, MD 02/04/14 [phone]

## 2014-02-02 NOTE — ED Notes (Signed)
Pt c/o bilat side and leg sickle cell pain that started this morning.

## 2014-02-03 ENCOUNTER — Inpatient Hospital Stay (HOSPITAL_COMMUNITY)
Admission: AD | Admit: 2014-02-03 | Discharge: 2014-02-07 | DRG: 812 | Disposition: A | Discharge status: Home / Self Care | Payer: Medicare Other | Attending: Internal Medicine | Admitting: Internal Medicine

## 2014-02-03 ENCOUNTER — Encounter (HOSPITAL_COMMUNITY): Payer: Self-pay | Admitting: Hematology

## 2014-02-03 ENCOUNTER — Telehealth (HOSPITAL_COMMUNITY): Payer: Self-pay | Admitting: Hematology

## 2014-02-03 ENCOUNTER — Inpatient Hospital Stay (HOSPITAL_COMMUNITY): Payer: Medicare Other

## 2014-02-03 DIAGNOSIS — Z96641 Presence of right artificial hip joint: Secondary | ICD-10-CM | POA: Diagnosis present

## 2014-02-03 DIAGNOSIS — Z7901 Long term (current) use of anticoagulants: Secondary | ICD-10-CM | POA: Diagnosis not present

## 2014-02-03 DIAGNOSIS — W540XXA Bitten by dog, initial encounter: Secondary | ICD-10-CM | POA: Diagnosis present

## 2014-02-03 DIAGNOSIS — M79672 Pain in left foot: Secondary | ICD-10-CM | POA: Diagnosis not present

## 2014-02-03 DIAGNOSIS — Z87891 Personal history of nicotine dependence: Secondary | ICD-10-CM | POA: Diagnosis not present

## 2014-02-03 DIAGNOSIS — Z86711 Personal history of pulmonary embolism: Secondary | ICD-10-CM | POA: Diagnosis not present

## 2014-02-03 DIAGNOSIS — Z885 Allergy status to narcotic agent status: Secondary | ICD-10-CM | POA: Diagnosis not present

## 2014-02-03 DIAGNOSIS — Q8901 Asplenia (congenital): Secondary | ICD-10-CM

## 2014-02-03 DIAGNOSIS — D638 Anemia in other chronic diseases classified elsewhere: Secondary | ICD-10-CM | POA: Diagnosis present

## 2014-02-03 DIAGNOSIS — I272 Other secondary pulmonary hypertension: Secondary | ICD-10-CM | POA: Diagnosis present

## 2014-02-03 DIAGNOSIS — D5700 Hb-SS disease with crisis, unspecified: Principal | ICD-10-CM | POA: Diagnosis present

## 2014-02-03 DIAGNOSIS — J069 Acute upper respiratory infection, unspecified: Secondary | ICD-10-CM | POA: Diagnosis not present

## 2014-02-03 DIAGNOSIS — F1290 Cannabis use, unspecified, uncomplicated: Secondary | ICD-10-CM | POA: Diagnosis present

## 2014-02-03 DIAGNOSIS — M79662 Pain in left lower leg: Secondary | ICD-10-CM | POA: Diagnosis not present

## 2014-02-03 DIAGNOSIS — Z9114 Patient's other noncompliance with medication regimen: Secondary | ICD-10-CM | POA: Diagnosis present

## 2014-02-03 DIAGNOSIS — R05 Cough: Secondary | ICD-10-CM | POA: Diagnosis not present

## 2014-02-03 DIAGNOSIS — F39 Unspecified mood [affective] disorder: Secondary | ICD-10-CM | POA: Diagnosis not present

## 2014-02-03 DIAGNOSIS — R0781 Pleurodynia: Secondary | ICD-10-CM | POA: Diagnosis present

## 2014-02-03 DIAGNOSIS — I5022 Chronic systolic (congestive) heart failure: Secondary | ICD-10-CM | POA: Diagnosis not present

## 2014-02-03 DIAGNOSIS — I10 Essential (primary) hypertension: Secondary | ICD-10-CM | POA: Diagnosis not present

## 2014-02-03 DIAGNOSIS — R0902 Hypoxemia: Secondary | ICD-10-CM

## 2014-02-03 DIAGNOSIS — M879 Osteonecrosis, unspecified: Secondary | ICD-10-CM | POA: Diagnosis not present

## 2014-02-03 DIAGNOSIS — D730 Hyposplenism: Secondary | ICD-10-CM

## 2014-02-03 LAB — CBC WITH DIFFERENTIAL/PLATELET
Basophils Absolute: 0.1 10*3/uL (ref 0.0–0.1)
Basophils Relative: 1 % (ref 0–1)
EOS ABS: 0.1 10*3/uL (ref 0.0–0.7)
Eosinophils Relative: 1 % (ref 0–5)
HCT: 21.2 % — ABNORMAL LOW (ref 39.0–52.0)
Hemoglobin: 7.2 g/dL — ABNORMAL LOW (ref 13.0–17.0)
LYMPHS ABS: 1.4 10*3/uL (ref 0.7–4.0)
LYMPHS PCT: 10 % — AB (ref 12–46)
MCH: 33.5 pg (ref 26.0–34.0)
MCHC: 34 g/dL (ref 30.0–36.0)
MCV: 98.6 fL (ref 78.0–100.0)
Monocytes Absolute: 1 10*3/uL (ref 0.1–1.0)
Monocytes Relative: 7 % (ref 3–12)
NEUTROS PCT: 81 % — AB (ref 43–77)
Neutro Abs: 11.3 10*3/uL — ABNORMAL HIGH (ref 1.7–7.7)
Platelets: 346 10*3/uL (ref 150–400)
RBC: 2.15 MIL/uL — AB (ref 4.22–5.81)
RDW: 22.1 % — AB (ref 11.5–15.5)
WBC: 13.9 10*3/uL — AB (ref 4.0–10.5)
nRBC: 9 /100 WBC — ABNORMAL HIGH

## 2014-02-03 MED ORDER — METOPROLOL TARTRATE 25 MG PO TABS
25.0000 mg | ORAL_TABLET | Freq: Every day | ORAL | Status: DC
  Administered 2014-02-04 – 2014-02-06 (×3): 25 mg via ORAL
  Filled 2014-02-03 (×4): qty 1

## 2014-02-03 MED ORDER — HYDROMORPHONE 2 MG/ML HIGH CONCENTRATION IV PCA SOLN
INTRAVENOUS | Status: DC
  Administered 2014-02-03: 13:00:00 via INTRAVENOUS
  Administered 2014-02-03: 11.2 mg via INTRAVENOUS
  Filled 2014-02-03: qty 25

## 2014-02-03 MED ORDER — LISINOPRIL 10 MG PO TABS
10.0000 mg | ORAL_TABLET | Freq: Every day | ORAL | Status: DC
  Administered 2014-02-04 – 2014-02-07 (×4): 10 mg via ORAL
  Filled 2014-02-03 (×4): qty 1

## 2014-02-03 MED ORDER — KETOROLAC TROMETHAMINE 30 MG/ML IJ SOLN
30.0000 mg | Freq: Four times a day (QID) | INTRAMUSCULAR | Status: DC
  Administered 2014-02-03 – 2014-02-07 (×15): 30 mg via INTRAVENOUS
  Filled 2014-02-03 (×22): qty 1

## 2014-02-03 MED ORDER — HYDROXYUREA 500 MG PO CAPS
1500.0000 mg | ORAL_CAPSULE | Freq: Every day | ORAL | Status: DC
  Administered 2014-02-04 – 2014-02-07 (×4): 1500 mg via ORAL
  Filled 2014-02-03 (×4): qty 3

## 2014-02-03 MED ORDER — HYDROMORPHONE HCL 2 MG/ML IJ SOLN: 1.0000 mg | Freq: Once | INTRAMUSCULAR | Status: DC

## 2014-02-03 MED ORDER — FOLIC ACID 1 MG PO TABS
1.0000 mg | ORAL_TABLET | Freq: Every morning | ORAL | Status: DC
  Administered 2014-02-04 – 2014-02-07 (×4): 1 mg via ORAL
  Filled 2014-02-03 (×4): qty 1

## 2014-02-03 MED ORDER — RIVAROXABAN 20 MG PO TABS
20.0000 mg | ORAL_TABLET | Freq: Every morning | ORAL | Status: DC
  Administered 2014-02-04 – 2014-02-07 (×4): 20 mg via ORAL
  Filled 2014-02-03 (×4): qty 1

## 2014-02-03 MED ORDER — POTASSIUM CHLORIDE CRYS ER 20 MEQ PO TBCR
20.0000 meq | EXTENDED_RELEASE_TABLET | Freq: Every morning | ORAL | Status: DC
  Administered 2014-02-04 – 2014-02-07 (×4): 20 meq via ORAL
  Filled 2014-02-03 (×4): qty 1

## 2014-02-03 MED ORDER — SENNOSIDES-DOCUSATE SODIUM 8.6-50 MG PO TABS
1.0000 | ORAL_TABLET | Freq: Every day | ORAL | Status: DC
  Administered 2014-02-06 – 2014-02-07 (×2): 1 via ORAL
  Filled 2014-02-03 (×6): qty 1

## 2014-02-03 MED ORDER — HYDROMORPHONE HCL 2 MG/ML IJ SOLN
1.5000 mg | Freq: Once | INTRAMUSCULAR | Status: AC
  Administered 2014-02-03: 1.5 mg via INTRAVENOUS
  Filled 2014-02-03: qty 1

## 2014-02-03 MED ORDER — DEXTROSE-NACL 5-0.45 % IV SOLN
INTRAVENOUS | Status: DC
  Administered 2014-02-03 – 2014-02-06 (×7): via INTRAVENOUS

## 2014-02-03 MED ORDER — SODIUM CHLORIDE 0.9 % IJ SOLN: 9.0000 mL | INTRAMUSCULAR | Status: DC | PRN

## 2014-02-03 MED ORDER — NALOXONE HCL 0.4 MG/ML IJ SOLN: 0.4000 mg | INTRAMUSCULAR | Status: DC | PRN

## 2014-02-03 MED ORDER — DIPHENHYDRAMINE HCL 12.5 MG/5ML PO ELIX
12.5000 mg | ORAL_SOLUTION | Freq: Four times a day (QID) | ORAL | Status: DC | PRN
Start: 2014-02-03 — End: 2014-02-04

## 2014-02-03 MED ORDER — HYDROMORPHONE HCL 2 MG/ML IJ SOLN
1.8000 mg | Freq: Once | INTRAMUSCULAR | Status: AC
  Administered 2014-02-03: 1.8 mg via INTRAVENOUS
  Filled 2014-02-03: qty 1

## 2014-02-03 MED ORDER — ONDANSETRON HCL 4 MG/2ML IJ SOLN: 4.0000 mg | Freq: Four times a day (QID) | INTRAMUSCULAR | Status: DC | PRN

## 2014-02-03 MED ORDER — MORPHINE SULFATE ER 30 MG PO TBCR
30.0000 mg | EXTENDED_RELEASE_TABLET | Freq: Two times a day (BID) | ORAL | Status: DC
  Administered 2014-02-03 – 2014-02-07 (×8): 30 mg via ORAL
  Filled 2014-02-03 (×8): qty 1

## 2014-02-03 MED ORDER — DIPHENHYDRAMINE HCL 50 MG/ML IJ SOLN
12.5000 mg | Freq: Four times a day (QID) | INTRAMUSCULAR | Status: DC | PRN
  Administered 2014-02-03 – 2014-02-04 (×2): 12.5 mg via INTRAVENOUS
  Filled 2014-02-03 (×5): qty 0.25

## 2014-02-03 MED ORDER — ZOLPIDEM TARTRATE 10 MG PO TABS
10.0000 mg | ORAL_TABLET | Freq: Every evening | ORAL | Status: DC | PRN
  Administered 2014-02-03 – 2014-02-05 (×3): 10 mg via ORAL
  Filled 2014-02-03 (×3): qty 1

## 2014-02-03 MED ORDER — HYDROMORPHONE 2 MG/ML HIGH CONCENTRATION IV PCA SOLN
INTRAVENOUS | Status: DC
  Administered 2014-02-03: 7.7 mg via INTRAVENOUS
  Administered 2014-02-03: 9.6 mg via INTRAVENOUS
  Administered 2014-02-04: 6.3 mg via INTRAVENOUS
  Administered 2014-02-04: 11:00:00 via INTRAVENOUS
  Administered 2014-02-04: 4.9 mg via INTRAVENOUS
  Administered 2014-02-04: 6.3 mg via INTRAVENOUS
  Administered 2014-02-04: 3.5 mg via INTRAVENOUS
  Administered 2014-02-04: 4.9 mg via INTRAVENOUS
  Administered 2014-02-05: 4.2 mg via INTRAVENOUS
  Administered 2014-02-05: 4.9 mg via INTRAVENOUS
  Administered 2014-02-05: via INTRAVENOUS
  Administered 2014-02-05: 3.5 mg via INTRAVENOUS
  Administered 2014-02-05: 2.1 mg via INTRAVENOUS
  Administered 2014-02-05: 7.7 mg via INTRAVENOUS
  Administered 2014-02-06: 4.2 mg via INTRAVENOUS
  Administered 2014-02-06: 9.1 mg via INTRAVENOUS
  Administered 2014-02-06: 5.6 mg via INTRAVENOUS
  Administered 2014-02-06: 4.9 mg via INTRAVENOUS
  Administered 2014-02-06: 2.8 mg via INTRAVENOUS
  Administered 2014-02-07: 4.2 mg via INTRAVENOUS
  Administered 2014-02-07: 5.6 mg via INTRAVENOUS
  Administered 2014-02-07: 2.8 mg via INTRAVENOUS
  Filled 2014-02-03 (×3): qty 25

## 2014-02-03 MED ORDER — POLYETHYLENE GLYCOL 3350 17 G PO PACK
17.0000 g | PACK | Freq: Every day | ORAL | Status: DC | PRN
  Filled 2014-02-03: qty 1

## 2014-02-03 NOTE — Discharge Summary (Addendum)
Physician Discharge Summary  [name]:[id number] DOB: 1980-05-13 DOA: 02/03/2014  PCP: [name],[name], MD  Admit date: 02/03/2014 Discharge date: 02/03/2014  Discharge Diagnoses:  Active Problems:   Sickle cell crisis   Discharge Condition: stable  Disposition: home   Diet:regular  Wt Readings from Last 3 Encounters:  02/03/14 170 lb (77.111 kg)  01/29/14 170 lb (77.111 kg)  01/22/14 160 lb (72.576 kg)    History of present illness:  [name] is a 34yo male with sickle cell disease who presents to the day hospital with complaints of pain in both sides of his ribs that he rates an 8/10. He stated that the rib pain started 2 days. He tried taking his home Dilaudid but did not find much relief. He was seen in the day hospital 5 days ago with bilateral leg pain that has subsided. He was then seen in the ED yesterday for pain and received 4 doses of Dilaudid. He was noted to still be in pain but refused admission. He has a non-productive cough that has been more frequent since it started 5 days ago. He also continues to smoke, although advised to quit. He denies HA, dizziness, fever, SOB, weakness, vomiting, and diarrhea. He also notes being bit by his dog last week. He went to give his dog a snack and she bit him accidentally. The dog's tooth pierced his finger. He states that his wound has been healing.  Hospital Course:  Pt was treated initially with weight based rapid re-dosing Dilaudid, IVF, and Toradol. His pain was at a 8/10, so he was then transitioned to a weight based Dilaudid PCA . Labs were checked and his Hgb was stable around baseline. Patient was re-evaluated and appeared to be improving. He rated his pain as a 5/10 but was still visibly in pain. Pt will be transferred to the floor for further pain management.  Discharge Exam:  Filed Vitals:   02/03/14 1426  BP: 125/77  Pulse: 79  Temp: 97.8 F (36.6 C)  Resp: 20   Filed Vitals:   02/03/14 1116  02/03/14 1230 02/03/14 1323 02/03/14 1426  BP: 132/87  132/85 125/77  Pulse: 76  83 79  Temp: 98.5 F (36.9 C)  98 F (36.7 C) 97.8 F (36.6 C)  TempSrc:   Oral Oral  Resp: 18 19 20 20   Height: 6' (1.829 m)     Weight: 170 lb (77.111 kg)     SpO2: 97% 93% 93% 94%   General: Alert, awake, oriented x3, in no acute distress.  HEENT: [place]/AT PEERL, EOMI Neck: Trachea midline,  no masses, no thyromegal,y no JVD, no carotid bruit OROPHARYNX:  Moist, No exudate/ erythema/lesions.  Heart: Regular rate and rhythm, without murmurs, rubs, gallops Lungs: Clear to auscultation, no wheezing or rhonchi noted. No increased vocal fremitus resonant to percussion  Abdomen: Soft, nontender, nondistended, positive bowel sounds, no masses no hepatosplenomegaly noted. Neuro: No focal neurological deficits noted cranial nerves II through XII grossly intact.  Strength 5 out of 5 in bilateral upper and lower extremities. Musculoskeletal: No warm swelling or erythema around joints, no spinal tenderness noted. Extremities: Left hand digit #2 (index) with scab on palmer side with no erythema, warmth, or swelling, but with slight bruising under nail bed. Psychiatric: Patient alert and oriented x3   Discharge Instructions As above    Medication List    ASK your doctor about these medications        folic acid 1 MG tablet  Commonly known as:  FOLVITE  Take 1 tablet (1 mg total) by mouth every morning.     HYDROmorphone 4 MG tablet  Commonly known as:  DILAUDID  Take 1 tablet (4 mg total) by mouth every 4 (four) hours as needed for severe pain.     hydroxyurea 500 MG capsule  Commonly known as:  HYDREA  Take 3 capsules (1,500 mg total) by mouth daily. May take with food to minimize GI side effects.     lisinopril 10 MG tablet  Commonly known as:  PRINIVIL,ZESTRIL  Take 1 tablet (10 mg total) by mouth daily.     metoprolol tartrate 25 MG tablet  Commonly known as:  LOPRESSOR  Take 1 tablet (25 mg  total) by mouth daily.     morphine 30 MG 12 hr tablet  Commonly known as:  MS CONTIN  Take 1 tablet (30 mg total) by mouth every 12 (twelve) hours.     potassium chloride SA 20 MEQ tablet  Commonly known as:  K-DUR,KLOR-CON  Take 1 tablet (20 mEq total) by mouth every morning.     rivaroxaban 20 MG Tabs tablet  Commonly known as:  XARELTO  Take 1 tablet (20 mg total) by mouth every morning.     zolpidem 10 MG tablet  Commonly known as:  AMBIEN  Take 1 tablet (10 mg total) by mouth at bedtime as needed for sleep.          The results of significant diagnostics from this hospitalization (including imaging, microbiology, ancillary and laboratory) are listed below for reference.    Significant Diagnostic Studies: No results found.  Microbiology: No results found for this or any previous visit (from the past 240 hour(s)).   Labs: Basic Metabolic Panel:  Recent Labs Lab 01/29/14 1151 02/02/14 1329  NA 134* 137  K 3.7 3.8  CL 108 110  CO2 22 22  GLUCOSE 94 100*  BUN 10 10  CREATININE 0.47* 0.36*  CALCIUM 8.9 8.7   Liver Function Tests:  Recent Labs Lab 01/29/14 1151 02/02/14 1329  AST 35 42*  ALT 23 33  ALKPHOS 71 83  BILITOT 5.2* 5.6*  PROT 7.9 7.6  ALBUMIN 4.4 4.3   No results for input(s): LIPASE, AMYLASE in the last 168 hours. No results for input(s): AMMONIA in the last 168 hours. CBC:  Recent Labs Lab 01/29/14 1151 02/02/14 1329 02/03/14 1151  WBC 13.8* 13.0* 13.9*  NEUTROABS 10.2* 9.1* 11.3*  HGB 7.7* 6.8* 7.2*  HCT 23.0* 20.0* 21.2*  MCV 98.3 98.0 98.6  PLT 528* 375 346    Time coordinating discharge:30 min  Signed:  Kalman Shan  02/03/2014, 3:13 PM

## 2014-02-03 NOTE — Progress Notes (Signed)
Patient ID: [name], male   DOB: January 30, 1981, 34 y.o.   MRN: [id number] Patient VSS, patient states pain is currently 6/10 on pain scale.  Patient recvd total of 11.2mg  dilaudid via PCA with 14/demands/14deliveries.  Patient amublatory to bathroom x1, tolerated diet without N/V.  Patient informed of transfer to room 1328.

## 2014-02-03 NOTE — Telephone Encounter (Signed)
Called patient back and advised that per Dr. Alben Deeds it is ok for him to come to day hospital.  Patient verbalizes understanding.

## 2014-02-03 NOTE — H&P (Addendum)
[facility] History and Physical  [name] [name] DOB: 1980/03/15 DOA: 02/03/2014   PCP: [name],Gerald A., MD   Chief Complaint: pain  RJJ:OACZYSA Fugitt is a 34yo male with sickle cell disease who presents to the day hospital with complaints of pain in both sides of his ribs that he rates an 8/10. He stated that the rib pain started 2 days. He tried taking his home Dilaudid but did not find much relief. He was seen in the day hospital 5 days ago with bilateral leg pain that has subsided. He was then seen in the ED yesterday for pain and received 4 doses of Dilaudid. He was noted to still be in pain but refused admission. He has a non-productive cough that has been more frequent since it started 5 days ago. Pt also continues to smoke although he was advised to quit. He denies HA, dizziness, fever, SOB, weakness, vomiting, and diarrhea. He also notes being bit by his dog last week. He went to give his dog a snack and she bit him accidentally. The dog's tooth pierced his finger. He states that his wound has been healing.   Review of Systems:  Constitutional: No weight loss, night sweats, fatigue. +rapid feelings of hot and cold HEENT: No headaches, dizziness, seizures, vision changes, difficulty swallowing,Tooth/dental problems,Sore throat, No sneezing, itching, ear ache, nasal congestion, post nasal drip,  Cardio-vascular: No chest pain, Orthopnea, PND, swelling in lower extremities, anasarca, dizziness, palpitations  GI: No heartburn, indigestion, abdominal pain, nausea, vomiting, diarrhea, change in bowel habits, loss of appetite  Resp: No shortness of breath with exertion or at rest. No excess mucus, no productive cough. + non-productive cough. No coughing up of blood.No change in color of mucus.No wheezing.No chest wall deformity  Skin: no rash or lesions.  GU: no dysuria, change in color of urine, no urgency or frequency. No flank pain.  Musculoskeletal: No  joint pain or swelling. No decreased range of motion. No back pain.  Psych: No change in mood or affect. No depression or anxiety. No memory loss.    Past Medical History  Diagnosis Date  . Sickle cell anemia   . Blood transfusion   . Acute embolism and thrombosis of right internal jugular vein   . Hypokalemia   . Mood disorder   . History of pulmonary embolus (PE)   . Avascular necrosis   . Leukocytosis     Chronic  . Thrombocytosis     Chronic  . Hypertension   . History of Clostridium difficile infection   . Uses marijuana   . Chronic anticoagulation   . Functional asplenia   . Former smoker   . Second hand tobacco smoke exposure   . Alcohol consumption of one to four drinks per day   . Noncompliance with medication regimen   . Sickle-cell crisis with associated acute chest syndrome 05/13/2013  . Acute chest syndrome 06/18/2013  . Demand ischemia 01/02/2014   Past Surgical History  Procedure Laterality Date  . Right hip replacement      08/2006  . Cholecystectomy      01/2008  . Porta cath placement    . Porta cath removal    . Umbilical hernia repair      01/2008  . Excision of left periauricular cyst      10/2009  . Excision of right ear lobe cyst with primary closur      11/2007  . Portacath placement  01/05/2012    Procedure:  INSERTION PORT-A-CATH;  Surgeon: Gerald Hollingshead, MD;  Location: [facility];  Service: General;  Laterality: N/A;  ultrasound guiced port a cath insertion with fluoroscopy   Social History:  reports that he quit smoking about 3 years ago. He has never used smokeless tobacco. He reports that he uses illicit drugs (Marijuana) about twice per week. He reports that he does not drink alcohol.  Allergies  Allergen Reactions  . Morphine And Related Hives and Rash    Pt states he can take PO, but not IV and he is able to tolerate Dilaudid with no reactions.    Family History  Problem Relation Age of Onset  . Sickle cell anemia Mother   . Sickle  cell anemia Father   . Sickle cell trait Brother     Prior to Admission medications   Medication Sig Start Date End Date Taking? Authorizing Provider  folic acid (FOLVITE) 1 MG tablet Take 1 tablet (1 mg total) by mouth every morning. 10/08/13  Yes Gerald Gamer, MD  HYDROmorphone (DILAUDID) 4 MG tablet Take 1 tablet (4 mg total) by mouth every 4 (four) hours as needed for severe pain. 12/24/13  Yes Gerald Gamer, MD  hydroxyurea (HYDREA) 500 MG capsule Take 3 capsules (1,500 mg total) by mouth daily. May take with food to minimize GI side effects. 10/30/13  Yes Gerald Gamer, MD  lisinopril (PRINIVIL,ZESTRIL) 10 MG tablet Take 1 tablet (10 mg total) by mouth daily. 01/22/14  Yes Gerald Gamer, MD  metoprolol tartrate (LOPRESSOR) 25 MG tablet Take 1 tablet (25 mg total) by mouth daily. 08/22/13  Yes Gerald Gamer, MD  morphine (MS CONTIN) 30 MG 12 hr tablet Take 1 tablet (30 mg total) by mouth every 12 (twelve) hours. 01/22/14  Yes Gerald Gamer, MD  potassium chloride SA (K-DUR,KLOR-CON) 20 MEQ tablet Take 1 tablet (20 mEq total) by mouth every morning. 09/25/13  Yes Gerald Gamer, MD  rivaroxaban (XARELTO) 20 MG TABS tablet Take 1 tablet (20 mg total) by mouth every morning. 01/06/14  Yes Gerald Gamer, MD  zolpidem (AMBIEN) 10 MG tablet Take 1 tablet (10 mg total) by mouth at bedtime as needed for sleep. 10/28/13  Yes Gerald Dew, FNP  levofloxacin (LEVAQUIN) 750 MG tablet Take 1 tablet (750 mg total) by mouth daily. Patient not taking: Reported on 12/22/2013 11/28/13   Gerald Reach, MD   Physical Exam: Filed Vitals:   02/03/14 1116  BP: 132/87  Pulse: 76  Temp: 98.5 F (36.9 C)  Resp: 18  Height: 6' (1.829 m)  Weight: 170 lb (77.111 kg)  SpO2: 97%   General: Alert, awake, oriented x3, in no acute distress.  HEENT: [name], EOMI Neck: Trachea midline,  no masses, no thyromegal,y no JVD, no carotid bruit OROPHARYNX:  Moist, No  exudate/ erythema/lesions.  Heart: Regular rate and rhythm, without murmurs, rubs, gallops, PMI non-displaced, no heaves or thrills on palpation.  Lungs: Clear to auscultation, no wheezing or rhonchi noted. No increased vocal fremitus resonant to percussion  Abdomen: Soft, nontender, nondistended, positive bowel sounds, no masses no hepatosplenomegaly noted..  Neuro: No focal neurological deficits noted cranial nerves II through XII grossly intact. MSK: no warmth, swelling, or erythema Extremities: mild clubbing of fingers, no cyanosis, or edema. Left hand digit #2 (index) with scab on palmer side with no erythema, warmth, or swelling, but with slight bruising under nail bed.   Labs on Admission:   Basic Metabolic Panel:  Recent Labs Lab 01/29/14 1151 02/02/14 1329  NA 134* 137  K 3.7 3.8  CL 108 110  CO2 22 22  GLUCOSE 94 100*  BUN 10 10  CREATININE 0.47* 0.36*  CALCIUM 8.9 8.7   Liver Function Tests:  Recent Labs Lab 01/29/14 1151 02/02/14 1329  AST 35 42*  ALT 23 33  ALKPHOS 71 83  BILITOT 5.2* 5.6*  PROT 7.9 7.6  ALBUMIN 4.4 4.3   CBC:  Recent Labs Lab 01/29/14 1151 02/02/14 1329  WBC 13.8* 13.0*  NEUTROABS 10.2* 9.1*  HGB 7.7* 6.8*  HCT 23.0* 20.0*  MCV 98.3 98.0  PLT 528* 375   BNP: Invalid input(s): POCBNP   Radiological Exams on Admission: No results found.   Assessment/Plan: Active Problems:   Sickle cell crisis Pt will be treated with initially treated with weight based rapid re-dosing Dilaudid, IVF and Toradol . If pain is above 7/10 then Pt will be transitioned to a weight based Dilaudid PCA . Patient will be re-evaluated for pain in the context of function and relationship to baseline as care progresses.  Time spend: 40 min Code Status: full Family Communication:  none Disposition Plan: pending improvement  Kalman Shan, MD  Pager [phone]  If 7PM-7AM, please contact night-coverage [email or web address] Password [email or web address] 02/03/2014,  11:53 AM

## 2014-02-03 NOTE — Telephone Encounter (Signed)
Patient C/O generalized pain that has not improved with home medications.  Patient denies N/V/D, denies chest pain or shortness of breath.  Patient states he is unsure if he has a elevated temperature, but he has been having hot/cold flashes and per patient he has a cold and has been coughing and sneezing.  I explained I would notify the physician and given him a call back. Patient verbalizes understanding.

## 2014-02-03 NOTE — H&P (Addendum)
[place] History and Physical  [name] SWF:[id number] DOB: [birth year] DOA: 02/03/2014   PCP: [name],[name], MD   Chief Complaint: pain  HPI: [name] is a 34yo male with sickle cell disease who presented to the day hospital with complaints of pain in both sides of his ribs that he rates an 8/10. He stated that the rib pain started 2 days. He tried taking his home Dilaudid but did not find much relief. He was seen in the day hospital 5 days ago with bilateral leg pain that has subsided. He was then seen in the ED yesterday for pain and received 4 doses of Dilaudid. He was noted to still be in pain but refused admission. He has a non-productive cough that has been more frequent since it started 5 days ago. He also continues to smoke, although he has been advised to quit. He denies HA, dizziness, fever, SOB, weakness, vomiting, and diarrhea. He also notes being bit by his dog last week. He went to give his dog a snack and she bit him accidentally. The dog's tooth pierced his finger. He states that his wound has been healing. In the day hospital, he was treated initially with weight based rapid re-dosing Dilaudid, IVF, and Toradol. He was then transitioned to a weight based Dilaudid PCA . Labs were checked and his Hgb was stable around baseline. Patient was re-evaluated and appeared to be improving. He rated his pain as a 5/10 but was still visibly in pain. Pt was transferred to the floor for further pain management.  Review of Systems:  Constitutional: No weight loss, night sweats, fatigue. +rapid feelings of hot and cold HEENT: No headaches, dizziness, seizures, vision changes, difficulty swallowing,Tooth/dental problems,Sore throat, No sneezing, itching, ear ache, nasal congestion, post nasal drip,  Cardio-vascular: No chest pain, Orthopnea, PND, swelling in lower extremities, anasarca, dizziness, palpitations  GI: No heartburn, indigestion, abdominal pain,  nausea, vomiting, diarrhea, change in bowel habits, loss of appetite  Resp: No shortness of breath with exertion or at rest. No excess mucus, no productive cough. + non-productive cough. No coughing up of blood.No change in color of mucus.No wheezing.No chest wall deformity  Skin: +scab on finger from dog bite GU: no dysuria, change in color of urine, no urgency or frequency. No flank pain.  Musculoskeletal: No joint pain or swelling. No decreased range of motion. No back pain.  Psych: No change in mood or affect. No depression or anxiety. No memory loss.    Past Medical History  Diagnosis Date  . Sickle cell anemia   . Blood transfusion   . Acute embolism and thrombosis of right internal jugular vein   . Hypokalemia   . Mood disorder   . History of pulmonary embolus (PE)   . Avascular necrosis   . Leukocytosis     Chronic  . Thrombocytosis     Chronic  . Hypertension   . History of Clostridium difficile infection   . Uses marijuana   . Chronic anticoagulation   . Functional asplenia   . Former smoker   . Second hand tobacco smoke exposure   . Alcohol consumption of one to four drinks per day   . Noncompliance with medication regimen   . Sickle-cell crisis with associated acute chest syndrome 05/13/2013  . Acute chest syndrome 06/18/2013  . Demand ischemia 01/02/2014   Past Surgical History  Procedure Laterality Date  . Right hip replacement      08/2006  . Cholecystectomy  01/2008  . Porta cath placement    . Porta cath removal    . Umbilical hernia repair      01/2008  . Excision of left periauricular cyst      10/2009  . Excision of right ear lobe cyst with primary closur      11/2007  . Portacath placement  01/05/2012    Procedure: INSERTION PORT-A-CATH;  Surgeon: Odis Hollingshead, MD;  Location: [facility];  Service: General;  Laterality: N/A;  ultrasound guiced port a cath insertion with fluoroscopy   Social History:  reports that he quit smoking about 3 years ago.  He has never used smokeless tobacco. He reports that he uses illicit drugs (Marijuana) about twice per week. He reports that he does not drink alcohol.  Allergies  Allergen Reactions  . Morphine And Related Hives and Rash    Pt states he can take PO, but not IV and he is able to tolerate Dilaudid with no reactions.    Family History  Problem Relation Age of Onset  . Sickle cell anemia Mother   . Sickle cell anemia Father   . Sickle cell trait Brother     Prior to Admission medications   Medication Sig Start Date End Date Taking? Authorizing Provider  folic acid (FOLVITE) 1 MG tablet Take 1 tablet (1 mg total) by mouth every morning. 10/08/13  Yes [name], MD  HYDROmorphone (DILAUDID) 4 MG tablet Take 1 tablet (4 mg total) by mouth every 4 (four) hours as needed for severe pain. 12/24/13  Yes [name], MD  hydroxyurea (HYDREA) 500 MG capsule Take 3 capsules (1,500 mg total) by mouth daily. May take with food to minimize GI side effects. 10/30/13  Yes [name], MD  lisinopril (PRINIVIL,ZESTRIL) 10 MG tablet Take 1 tablet (10 mg total) by mouth daily. 01/22/14  Yes [name], MD  metoprolol tartrate (LOPRESSOR) 25 MG tablet Take 1 tablet (25 mg total) by mouth daily. 08/22/13  Yes [name], MD  morphine (MS CONTIN) 30 MG 12 hr tablet Take 1 tablet (30 mg total) by mouth every 12 (twelve) hours. 01/22/14  Yes [name], MD  potassium chloride SA (K-DUR,KLOR-CON) 20 MEQ tablet Take 1 tablet (20 mEq total) by mouth every morning. 09/25/13  Yes [name], MD  rivaroxaban (XARELTO) 20 MG TABS tablet Take 1 tablet (20 mg total) by mouth every morning. 01/06/14  Yes [name], MD  zolpidem (AMBIEN) 10 MG tablet Take 1 tablet (10 mg total) by mouth at bedtime as needed for sleep. 10/28/13  Yes Dorena Dew, FNP  levofloxacin (LEVAQUIN) 750 MG tablet Take 1 tablet (750 mg total) by mouth daily. Patient not taking:  Reported on 12/22/2013 11/28/13   Elwyn Reach, MD   Physical Exam: Filed Vitals:   02/03/14 1116 02/03/14 1230 02/03/14 1323 02/03/14 1426  BP: 132/87  132/85 125/77  Pulse: 76  83 79  Temp: 98.5 F (36.9 C)  98 F (36.7 C) 97.8 F (36.6 C)  TempSrc:   Oral Oral  Resp: 18 19 20 20   Height: 6' (1.829 m)     Weight: 170 lb (77.111 kg)     SpO2: 97% 93% 93% 94%   General: Alert, awake, oriented x3, in no acute distress.  HEENT: [place]/AT PEERL, EOMI Neck: Trachea midline,  no masses, no thyromegal,y no JVD, no carotid bruit OROPHARYNX:  Moist, No exudate/ erythema/lesions.  Heart: Regular rate and  rhythm, without murmurs, rubs, gallops Lungs: Clear to auscultation, no wheezing or rhonchi noted. No increased vocal fremitus resonant to percussion  Abdomen: Soft, nontender, nondistended, positive bowel sounds, no masses no hepatosplenomegaly noted..  Neuro: No focal neurological deficits noted cranial nerves II through XII grossly intact. MSK: no warmth, swelling, or erythema Extremities: mild clubbing of fingers, no cyanosis, or edema. Left hand digit #2 (index) with scab on palmer side with no erythema, warmth, or swelling, but with slight bruising under nail bed. Nl cap refill and sensation of the finger.  Labs on Admission:   Basic Metabolic Panel:  Recent Labs Lab 01/29/14 1151 02/02/14 1329  NA 134* 137  K 3.7 3.8  CL 108 110  CO2 22 22  GLUCOSE 94 100*  BUN 10 10  CREATININE 0.47* 0.36*  CALCIUM 8.9 8.7   Liver Function Tests:  Recent Labs Lab 01/29/14 1151 02/02/14 1329  AST 35 42*  ALT 23 33  ALKPHOS 71 83  BILITOT 5.2* 5.6*  PROT 7.9 7.6  ALBUMIN 4.4 4.3   CBC:  Recent Labs Lab 01/29/14 1151 02/02/14 1329 02/03/14 1151  WBC 13.8* 13.0* 13.9*  NEUTROABS 10.2* 9.1* 11.3*  HGB 7.7* 6.8* 7.2*  HCT 23.0* 20.0* 21.2*  MCV 98.3 98.0 98.6  PLT 528* 375 346   BNP: Invalid input(s): POCBNP   Radiological Exams on Admission: No results  found.   Assessment/Plan: Active Problems:   Sickle cell crisis 1. Sickle Cell Anemia with crisis: Continue Dilaudid PCA and Toradol. Continue MS contin for long acting. 2. Anemia: Hgb of 7.2, at baseline. Monitor. 3. Sickle Cell Care: Continue Folic Acid and Hydroxyurea  4. H/o PE: Continue Rivaroxaban 5. HTN: controlled. Continue home Lisinopril and Metoprolol 6. Dog Bite: wound appears to be healing with no signs of infection, or neurovascular compromise. Out of prophylactic antibiotic window. Will monitor. Last Tdap on 11/21/12. 7. Cough and Hypoxia: Likely due to a viral URI or because pt continues to smoke. PNA and ACS are unlikely. Will send a flu panel and obtain CXR. Pulmonary HTN is also likely contributing to hypoxia. Will place him on oxygen to keep O2 sat above 95%. Pt advised to quit smoking. He states that he smokes marijuana occasionally. He did not want to set a quit date nor feels that he is ready to quit.    8. FEN/GI :D5/0.45% NS. Continue KDur. Regular Diet  Bowel regimen in place  Code Status: Full code DVT Prophylaxis: on Xarelto Family Communication: none Disposition Plan: pending improvement   Kalman Shan, MD  Pager [phone]  If 7PM-7AM, please contact night-coverage [email or web address] Password [email or web address] 02/03/2014, 3:18 PM

## 2014-02-04 ENCOUNTER — Encounter (HOSPITAL_COMMUNITY): Payer: Self-pay

## 2014-02-04 DIAGNOSIS — M79609 Pain in unspecified limb: Secondary | ICD-10-CM

## 2014-02-04 DIAGNOSIS — D5700 Hb-SS disease with crisis, unspecified: Secondary | ICD-10-CM | POA: Diagnosis not present

## 2014-02-04 DIAGNOSIS — R0781 Pleurodynia: Secondary | ICD-10-CM | POA: Diagnosis not present

## 2014-02-04 DIAGNOSIS — M79662 Pain in left lower leg: Secondary | ICD-10-CM | POA: Diagnosis not present

## 2014-02-04 LAB — CBC WITH DIFFERENTIAL/PLATELET
BASOS PCT: 1 % (ref 0–1)
Basophils Absolute: 0.2 10*3/uL — ABNORMAL HIGH (ref 0.0–0.1)
EOS ABS: 0.2 10*3/uL (ref 0.0–0.7)
EOS PCT: 1 % (ref 0–5)
HCT: 19.7 % — ABNORMAL LOW (ref 39.0–52.0)
Hemoglobin: 6.8 g/dL — CL (ref 13.0–17.0)
LYMPHS PCT: 28 % (ref 12–46)
Lymphs Abs: 4.8 10*3/uL — ABNORMAL HIGH (ref 0.7–4.0)
MCH: 34.3 pg — AB (ref 26.0–34.0)
MCHC: 34.5 g/dL (ref 30.0–36.0)
MCV: 99.5 fL (ref 78.0–100.0)
Monocytes Absolute: 1.7 10*3/uL — ABNORMAL HIGH (ref 0.1–1.0)
Monocytes Relative: 10 % (ref 3–12)
NEUTROS ABS: 10.1 10*3/uL — AB (ref 1.7–7.7)
NEUTROS PCT: 60 % (ref 43–77)
NRBC: 9 /100{WBCs} — AB
Platelets: 304 10*3/uL (ref 150–400)
RBC: 1.98 MIL/uL — ABNORMAL LOW (ref 4.22–5.81)
RDW: 22 % — AB (ref 11.5–15.5)
WBC: 17 10*3/uL — ABNORMAL HIGH (ref 4.0–10.5)

## 2014-02-04 LAB — RETICULOCYTES
RBC.: 1.98 MIL/uL — ABNORMAL LOW (ref 4.22–5.81)
RETIC COUNT ABSOLUTE: 409.9 10*3/uL — AB (ref 19.0–186.0)
Retic Ct Pct: 20.7 % — ABNORMAL HIGH (ref 0.4–3.1)

## 2014-02-04 LAB — BASIC METABOLIC PANEL
Anion gap: 5 (ref 5–15)
BUN: 7 mg/dL (ref 6–23)
CHLORIDE: 110 meq/L (ref 96–112)
CO2: 22 mmol/L (ref 19–32)
Calcium: 8.4 mg/dL (ref 8.4–10.5)
Creatinine, Ser: 0.55 mg/dL (ref 0.50–1.35)
GFR calc non Af Amer: >90 mL/min (ref >90)
GLUCOSE: 118 mg/dL — AB (ref 70–99)
Potassium: 3.5 mmol/L (ref 3.5–5.1)
Sodium: 137 mmol/L (ref 135–145)

## 2014-02-04 LAB — LACTATE DEHYDROGENASE: LDH: 390 U/L — ABNORMAL HIGH (ref 94–250)

## 2014-02-04 MED ORDER — SODIUM CHLORIDE 0.9 % IV SOLN
25.0000 mg | Freq: Four times a day (QID) | INTRAVENOUS | Status: DC | PRN
  Administered 2014-02-04 – 2014-02-06 (×4): 25 mg via INTRAVENOUS
  Filled 2014-02-04 (×7): qty 0.5

## 2014-02-04 MED ORDER — HYDROMORPHONE HCL 2 MG/ML IJ SOLN
2.0000 mg | Freq: Once | INTRAMUSCULAR | Status: AC
  Administered 2014-02-04: 2 mg via INTRAVENOUS
  Filled 2014-02-04: qty 1

## 2014-02-04 MED ORDER — DIPHENHYDRAMINE HCL 25 MG PO CAPS
25.0000 mg | ORAL_CAPSULE | Freq: Four times a day (QID) | ORAL | Status: DC | PRN
  Administered 2014-02-06: 50 mg via ORAL
  Filled 2014-02-04: qty 2
  Filled 2014-02-04: qty 1

## 2014-02-04 MED ORDER — HYDROMORPHONE HCL 2 MG/ML IJ SOLN
2.0000 mg | INTRAMUSCULAR | Status: DC | PRN
  Administered 2014-02-04 – 2014-02-06 (×11): 2 mg via INTRAVENOUS
  Filled 2014-02-04 (×11): qty 1

## 2014-02-04 NOTE — Progress Notes (Addendum)
CRITICAL VALUE ALERT  Critical value received:  hgb 6.8  Date of notification:  02/04/14  Time of notification:  0810  Critical value read back:Yes.    Nurse who received alert:  Trenda Moots RN  MD notified (1st page):  Liston Alba MD  Time of first page:  [phone]  Responding MD:  Liston Alba MD  Time MD responded:  [phone]

## 2014-02-04 NOTE — Progress Notes (Signed)
SICKLE CELL SERVICE PROGRESS NOTE  [name] [id number]:[id number] DOB: 1980-05-04 DOA: 02/03/2014 PCP: Everline Mahaffy A., MD  Assessment/Plan: Active Problems:   Sickle cell crisis   Tenderness of left calf  1. Hb SS with crisis: Pt up walking in room and states that although he has pain in the back and BLE's, most of his pain is localized to the left calf. He has used 43.2 mg with 61/58 demands deliveries. He is currently writhing in pain from the left calf. I will give a clinician assisted dose of dilaudid  2 mg and re-evaluate. Continue MS Contin and Toradol. 2. Tenderness of left calf: Pt developed pain in left calf overnight. He has tenderness and fullness to touch. The area is neither warm or erythematous. Will obtain duplex U/S to evaluate for DVT or Baker's cyst. Continue symptomatic treatment. Pt currently on Xarelto and if he has a DVT he would have failed therapy for chronic anticoagulation. A DVT would warrant transfusion to decrease the % of Hb S which in itself is thrombogenic. 3. Pulmonary HTN: Pt has a history of Pulmonary HTN however appears to be well compensated at present. Continue oxygen supplementation as necessary.  4. 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 He is at his baseline Hb of about 7 g/dL. Will continue to monitor. 5. 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 He has been offered and counseled on chelation therapy but has declined. 6. 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 Continue. 7. Avascular necrosis right hip: Pt is s/p right Total Hip Arthroplasty 8. Marijuana Use: Pt admits to using marijuana on an occasional basis 9. Right heart chronic systolic dysfunction: Clinically compensated  Code Status: Full Code Family Communication: N/A Disposition Plan: Not yet ready for discharge  [place].  Pager [phone]. If 7PM-7AM, please contact  night-coverage.  02/04/2014, 10:19 AM  LOS: 1 day   Consultants:  None  Procedures:  None  Antibiotics:  None  HPI/Subjective: Pt up walking in room and states that although he has pain in the back and BLE's, most of his pain is localized to the left calf. He rates his overall pain 6/10 except in the left calf where it is 9/10. He has no other complaints.  Objective: Filed Vitals:   02/04/14 0216 02/04/14 0457 02/04/14 0509 02/04/14 0800  BP: 133/80  135/86   Pulse: 74  84   Temp: 98 F (36.7 C)  98.4 F (36.9 C)   TempSrc: Oral  Oral   Resp: 20 20 20 18   Height:      Weight:   161 lb 6.4 oz (73.211 kg)   SpO2: 98% 95% 99% 97%   Weight change:   Intake/Output Summary (Last 24 hours) at 02/04/14 1019 Last data filed at 02/04/14 0510  Gross per 24 hour  Intake    550 ml  Output      0 ml  Net    550 ml    General: Alert, awake, oriented x3, in mild distress secondary to pain in LLE.Marland Kitchen  HEENT: [place]/AT PEERL, EOMI. Mild icterus Neck: Trachea midline,  no masses, no thyromegal,y no JVD, no carotid bruit OROPHARYNX:  Moist, No exudate/ erythema/lesions.  Heart: Regular rate and rhythm, without murmurs, rubs, gallops, PMI non-displaced, no heaves or thrills on palpation.  Lungs: Clear to auscultation, no wheezing or rhonchi noted. No increased vocal fremitus resonant to percussion  Abdomen: Soft, nontender, nondistended, positive bowel sounds, no masses no  hepatosplenomegaly noted..  Neuro: No focal neurological deficits noted cranial nerves II through XII grossly intact. DTRs 2+ bilaterally upper and lower extremities. Strength 5 out of 5 in bilateral upper and lower extremities. Musculoskeletal: Erythema and tenderness noted in the calf of the LLE. No masses or chords felt on palpation.  Psychiatric: Patient alert and oriented x3, good insight and cognition, good recent to remote recall.    Data Reviewed: Basic Metabolic Panel:  Recent Labs Lab 01/29/14 1151  02/02/14 1329 02/04/14 0550  NA 134* 137 137  K 3.7 3.8 3.5  CL 108 110 110  CO2 22 22 22   GLUCOSE 94 100* 118*  BUN 10 10 7   CREATININE 0.47* 0.36* 0.55  CALCIUM 8.9 8.7 8.4   Liver Function Tests:  Recent Labs Lab 01/29/14 1151 02/02/14 1329  AST 35 42*  ALT 23 33  ALKPHOS 71 83  BILITOT 5.2* 5.6*  PROT 7.9 7.6  ALBUMIN 4.4 4.3   No results for input(s): LIPASE, AMYLASE in the last 168 hours. No results for input(s): AMMONIA in the last 168 hours. CBC:  Recent Labs Lab 01/29/14 1151 02/02/14 1329 02/03/14 1151 02/04/14 0550  WBC 13.8* 13.0* 13.9* 17.0*  NEUTROABS 10.2* 9.1* 11.3* 10.1*  HGB 7.7* 6.8* 7.2* 6.8*  HCT 23.0* 20.0* 21.2* 19.7*  MCV 98.3 98.0 98.6 99.5  PLT 528* 375 346 304   Cardiac Enzymes: No results for input(s): CKTOTAL, CKMB, CKMBINDEX, TROPONINI in the last 168 hours. BNP (last 3 results)  Recent Labs  01/01/14 0909  PROBNP 5790.0*   CBG: No results for input(s): GLUCAP in the last 168 hours.  No results found for this or any previous visit (from the past 240 hour(s)).   Studies: Dg Chest 2 View  02/03/2014   CLINICAL DATA:  Hypoxia, cough, sickle cell disease, history pulmonary embolism, smoking  EXAM: CHEST  2 VIEW  COMPARISON:  01/02/2014  FINDINGS: LEFT subclavian Port-A-Cath with tip projecting over cavoatrial junction.  Enlargement of cardiac silhouette with pulmonary vascular congestion.  Mediastinal contour stable.  Lungs clear.  No pleural effusion or pneumothorax.  Bones unremarkable.  IMPRESSION: Enlargement of cardiac silhouette with pulmonary vascular congestion consistent with history of sickle cell disease.  No acute abnormalities.   Electronically Signed   By: Lavonia Dana M.D.   On: 02/03/2014 20:11    Scheduled Meds: . folic acid  1 mg Oral q morning - 10a  . HYDROmorphone PCA 2 mg/mL   Intravenous 6 times per day  .  HYDROmorphone (DILAUDID) injection  2 mg Intravenous Once  . hydroxyurea  1,500 mg Oral Daily  .  ketorolac  30 mg Intravenous 4 times per day  . lisinopril  10 mg Oral Daily  . metoprolol tartrate  25 mg Oral Daily  . morphine  30 mg Oral Q12H  . potassium chloride SA  20 mEq Oral q morning - 10a  . rivaroxaban  20 mg Oral q morning - 10a  . senna-docusate  1 tablet Oral Daily   Continuous Infusions: . dextrose 5 % and 0.45% NaCl 100 mL/hr at 02/03/14 2214    Time spent 45 minutes.

## 2014-02-04 NOTE — Progress Notes (Signed)
*  Preliminary Results* Left lower extremity venous duplex completed. Left lower extremity is negative for deep vein thrombosis. There is no evidence of left Baker's cyst.  02/04/2014 2:33 PM  Maudry Mayhew, RVT, RDCS, RDMS

## 2014-02-05 DIAGNOSIS — D638 Anemia in other chronic diseases classified elsewhere: Secondary | ICD-10-CM

## 2014-02-05 DIAGNOSIS — D5700 Hb-SS disease with crisis, unspecified: Secondary | ICD-10-CM | POA: Diagnosis not present

## 2014-02-05 DIAGNOSIS — R0781 Pleurodynia: Secondary | ICD-10-CM | POA: Diagnosis not present

## 2014-02-05 DIAGNOSIS — R0902 Hypoxemia: Secondary | ICD-10-CM

## 2014-02-05 LAB — RESPIRATORY VIRUS PANEL
Adenovirus: NOT DETECTED
INFLUENZA A H3: NOT DETECTED
Influenza A H1: NOT DETECTED
Influenza A: NOT DETECTED
Influenza B: NOT DETECTED
METAPNEUMOVIRUS: NOT DETECTED
PARAINFLUENZA 1 A: NOT DETECTED
PARAINFLUENZA 2 A: NOT DETECTED
PARAINFLUENZA 3 A: NOT DETECTED
RESPIRATORY SYNCYTIAL VIRUS A: NOT DETECTED
Respiratory Syncytial Virus B: NOT DETECTED
Rhinovirus: NOT DETECTED

## 2014-02-05 LAB — CBC WITH DIFFERENTIAL/PLATELET
BASOS ABS: 0.2 10*3/uL — AB (ref 0.0–0.1)
Basophils Relative: 1 % (ref 0–1)
EOS ABS: 0.6 10*3/uL (ref 0.0–0.7)
EOS PCT: 3 % (ref 0–5)
HCT: 19.7 % — ABNORMAL LOW (ref 39.0–52.0)
Hemoglobin: 6.9 g/dL — CL (ref 13.0–17.0)
Lymphocytes Relative: 12 % (ref 12–46)
Lymphs Abs: 2.2 10*3/uL (ref 0.7–4.0)
MCH: 34.7 pg — ABNORMAL HIGH (ref 26.0–34.0)
MCHC: 35 g/dL (ref 30.0–36.0)
MCV: 99 fL (ref 78.0–100.0)
MONOS PCT: 12 % (ref 3–12)
Monocytes Absolute: 2.2 10*3/uL — ABNORMAL HIGH (ref 0.1–1.0)
NEUTROS ABS: 13.4 10*3/uL — AB (ref 1.7–7.7)
Neutrophils Relative %: 72 % (ref 43–77)
PLATELETS: 301 10*3/uL (ref 150–400)
RBC: 1.99 MIL/uL — ABNORMAL LOW (ref 4.22–5.81)
RDW: 21.6 % — AB (ref 11.5–15.5)
WBC: 18.6 10*3/uL — ABNORMAL HIGH (ref 4.0–10.5)
nRBC: 5 /100 WBC — ABNORMAL HIGH

## 2014-02-05 LAB — INFLUENZA PANEL BY PCR (TYPE A & B)
H1N1 flu by pcr: NOT DETECTED
Influenza A By PCR: NEGATIVE
Influenza B By PCR: NEGATIVE

## 2014-02-05 LAB — BASIC METABOLIC PANEL
Anion gap: 8 (ref 5–15)
BUN: 8 mg/dL (ref 6–23)
CO2: 21 mmol/L (ref 19–32)
Calcium: 8.8 mg/dL (ref 8.4–10.5)
Chloride: 111 mEq/L (ref 96–112)
Creatinine, Ser: 0.53 mg/dL (ref 0.50–1.35)
GFR calc Af Amer: >90 mL/min (ref >90)
GFR calc non Af Amer: >90 mL/min (ref >90)
GLUCOSE: 130 mg/dL — AB (ref 70–99)
Potassium: 4 mmol/L (ref 3.5–5.1)
SODIUM: 140 mmol/L (ref 135–145)

## 2014-02-05 MED ORDER — SODIUM CHLORIDE 0.9 % IV SOLN: Freq: Once | INTRAVENOUS | Status: AC

## 2014-02-05 MED ORDER — SODIUM CHLORIDE 0.9 % IV SOLN
Freq: Once | INTRAVENOUS | Status: AC
  Administered 2014-02-05: 16:00:00 via INTRAVENOUS

## 2014-02-05 MED ORDER — OXYMETAZOLINE HCL 0.05 % NA SOLN
1.0000 | Freq: Two times a day (BID) | NASAL | Status: DC | PRN
  Filled 2014-02-05: qty 15

## 2014-02-05 MED ORDER — HYDROMORPHONE HCL 4 MG PO TABS
4.0000 mg | ORAL_TABLET | ORAL | Status: DC
  Administered 2014-02-05 – 2014-02-07 (×10): 4 mg via ORAL
  Filled 2014-02-05 (×10): qty 1

## 2014-02-05 MED ORDER — SALINE SPRAY 0.65 % NA SOLN
1.0000 | NASAL | Status: DC | PRN
Start: 2014-02-05 — End: 2014-02-07
  Filled 2014-02-05: qty 44

## 2014-02-05 MED ORDER — HYDROMORPHONE HCL 2 MG/ML IJ SOLN: 2.0000 mg | INTRAMUSCULAR | Status: DC | PRN

## 2014-02-05 NOTE — Care Management (Signed)
CARE MANAGEMENT NOTE 02/05/2014  Patient:  [name], [name]   Account Number:  [id number]  Date Initiated:  02/05/2014  Documentation initiated by:  Marney Doctor  Subjective/Objective Assessment:   34 yo admitted with [facility]     Action/Plan:   From home alone   Anticipated DC Date:  02/07/2014   Anticipated DC Plan:  [place]  CM consult      Choice offered to / List presented to:             Status of service:  In process, will continue to follow Medicare Important Message given?   (If response is "NO", the following Medicare IM given date fields will be blank) Date Medicare IM given:   Medicare IM given by:   Date Additional Medicare IM given:   Additional Medicare IM given by:    Discharge Disposition:    Per UR Regulation:  Reviewed for med. necessity/level of care/duration of stay  If discussed at [place] of Stay Meetings, dates discussed:    Comments:  02/05/14 Marney Doctor RN,BSN,NCM [phone] Chart reviewed and CM following for DC needs.

## 2014-02-05 NOTE — Progress Notes (Signed)
SICKLE CELL SERVICE PROGRESS NOTE  [name] [id number]:[id number] DOB: 1980/05/14 DOA: 02/03/2014 PCP: [name],[name], MD  Assessment/Plan: Active Problems:   Sickle cell crisis   Tenderness of left calf  1. Hb SS with crisis: His pain is improved today. I will decrease clinician assisted dose of dilaudid  2 mg and re-evaluate. Continue current PCA dose, MS Contin and Toradol. 2. Tenderness of left calf: Duplex U/S was negative for DVT and baker's cyst. Pain is a lot less today. I suspect pain was likely related to bone infarct associated with vaso-occlusive episode. 3. Pulmonary HTN/Hypoxemia: Pt has a history of Pulmonary HTN and is currently having hypoxemia. Will transfuse 1 unit PRBC's in light of hypoxemia. 4. 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 He had a small decrease in Hb but is symptomatic with Hypoxemia ad tachycardia. Will transfuse 1 unit PRBC's. 5. 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 He has been offered and counseled on chelation therapy but has declined. 6. 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 Continue. 7. Avascular necrosis right hip: Pt is s/p right Total Hip Arthroplasty 8. Marijuana Use: Pt admits to using marijuana on an occasional basis 9. Right heart chronic systolic dysfunction: Clinically compensated  Code Status: Full Code Family Communication: N/A Disposition Plan: Not yet ready for discharge  [name].  Pager [phone]. If 7PM-7AM, please contact night-coverage.  02/05/2014, 10:53 AM  LOS: 2 days   Consultants:  None  Procedures:  None  Antibiotics:  None  HPI/Subjective: Pt up walking in room and states that he feels much better today. The pain in his left calf has decreased considerably and he rates his overall pain as 6/10. He has used a total of 50 mg of dilaudid in the last 24 hours. Pt complains of a rash on his left popliteal region  which on examination is a scratch which is healing over. He is jovial and is much better spirits today. Objective: Filed Vitals:   02/04/14 2348 02/05/14 0429 02/05/14 0438 02/05/14 0758  BP:  131/90    Pulse:  108    Temp:  98.4 F (36.9 C)    TempSrc:  Oral    Resp: 18 20 18 17   Height:      Weight:      SpO2: 94% 91% 94% 92%   Weight change:   Intake/Output Summary (Last 24 hours) at 02/05/14 1053 Last data filed at 02/04/14 1230  Gross per 24 hour  Intake    240 ml  Output      0 ml  Net    240 ml    General: Alert, awake, oriented x3, in no distress today. HEENT: [place]/AT PEERL, EOMI. Mild icterus Neck: Trachea midline,  no masses, no thyromegal,y no JVD, no carotid bruit OROPHARYNX:  Moist, No exudate/ erythema/lesions.  Heart: Regular rate and rhythm, without murmurs, rubs, gallops, PMI non-displaced, no heaves or thrills on palpation.  Lungs: Clear to auscultation, no wheezing or rhonchi noted. No increased vocal fremitus resonant to percussion  Abdomen: Soft, nontender, nondistended, positive bowel sounds, no masses no hepatosplenomegaly noted..  Neuro: No focal neurological deficits noted cranial nerves II through XII grossly intact. DTRs 2+ bilaterally upper and lower extremities. Strength 5 out of 5 in bilateral upper and lower extremities. Musculoskeletal: No warmth, erythema or swelling around joints or soft tissue.  Psychiatric: Patient alert and oriented x3, good insight and cognition, good recent to remote recall.  Data Reviewed: Basic Metabolic Panel:  Recent Labs Lab 01/29/14 1151 02/02/14 1329 02/04/14 0550 02/05/14 0550  NA 134* 137 137 140  K 3.7 3.8 3.5 4.0  CL 108 110 110 111  CO2 22 22 22 21   GLUCOSE 94 100* 118* 130*  BUN 10 10 7 8   CREATININE 0.47* 0.36* 0.55 0.53  CALCIUM 8.9 8.7 8.4 8.8   Liver Function Tests:  Recent Labs Lab 01/29/14 1151 02/02/14 1329  AST 35 42*  ALT 23 33  ALKPHOS 71 83  BILITOT 5.2* 5.6*  PROT 7.9 7.6   ALBUMIN 4.4 4.3   No results for input(s): LIPASE, AMYLASE in the last 168 hours. No results for input(s): AMMONIA in the last 168 hours. CBC:  Recent Labs Lab 01/29/14 1151 02/02/14 1329 02/03/14 1151 02/04/14 0550 02/05/14 0550  WBC 13.8* 13.0* 13.9* 17.0* 18.6*  NEUTROABS 10.2* 9.1* 11.3* 10.1* 13.4*  HGB 7.7* 6.8* 7.2* 6.8* 6.9*  HCT 23.0* 20.0* 21.2* 19.7* 19.7*  MCV 98.3 98.0 98.6 99.5 99.0  PLT 528* 375 346 304 301   Cardiac Enzymes: No results for input(s): CKTOTAL, CKMB, CKMBINDEX, TROPONINI in the last 168 hours. BNP (last 3 results)  Recent Labs  01/01/14 0909  PROBNP 5790.0*   CBG: No results for input(s): GLUCAP in the last 168 hours.  No results found for this or any previous visit (from the past 240 hour(s)).   Studies: Dg Chest 2 View  02/03/2014   CLINICAL DATA:  Hypoxia, cough, sickle cell disease, history pulmonary embolism, smoking  EXAM: CHEST  2 VIEW  COMPARISON:  01/02/2014  FINDINGS: LEFT subclavian Port-A-Cath with tip projecting over cavoatrial junction.  Enlargement of cardiac silhouette with pulmonary vascular congestion.  Mediastinal contour stable.  Lungs clear.  No pleural effusion or pneumothorax.  Bones unremarkable.  IMPRESSION: Enlargement of cardiac silhouette with pulmonary vascular congestion consistent with history of sickle cell disease.  No acute abnormalities.   Electronically Signed   By: Lavonia Dana M.D.   On: 02/03/2014 20:11    Scheduled Meds: . sodium chloride   Intravenous Once  . folic acid  1 mg Oral q morning - 10a  . HYDROmorphone PCA 2 mg/mL   Intravenous 6 times per day  . hydroxyurea  1,500 mg Oral Daily  . ketorolac  30 mg Intravenous 4 times per day  . lisinopril  10 mg Oral Daily  . metoprolol tartrate  25 mg Oral Daily  . morphine  30 mg Oral Q12H  . potassium chloride SA  20 mEq Oral q morning - 10a  . rivaroxaban  20 mg Oral q morning - 10a  . senna-docusate  1 tablet Oral Daily   Continuous  Infusions: . dextrose 5 % and 0.45% NaCl 100 mL/hr at 02/05/14 0546    Time spent 35 minutes.

## 2014-02-06 DIAGNOSIS — R0781 Pleurodynia: Secondary | ICD-10-CM | POA: Diagnosis not present

## 2014-02-06 DIAGNOSIS — D5700 Hb-SS disease with crisis, unspecified: Secondary | ICD-10-CM | POA: Diagnosis not present

## 2014-02-06 LAB — CBC WITH DIFFERENTIAL/PLATELET
BASOS ABS: 0.2 10*3/uL — AB (ref 0.0–0.1)
Basophils Relative: 1 % (ref 0–1)
Eosinophils Absolute: 0.5 10*3/uL (ref 0.0–0.7)
Eosinophils Relative: 3 % (ref 0–5)
HCT: 22.5 % — ABNORMAL LOW (ref 39.0–52.0)
Hemoglobin: 7.6 g/dL — ABNORMAL LOW (ref 13.0–17.0)
LYMPHS ABS: 1.9 10*3/uL (ref 0.7–4.0)
Lymphocytes Relative: 12 % (ref 12–46)
MCH: 32.2 pg (ref 26.0–34.0)
MCHC: 33.8 g/dL (ref 30.0–36.0)
MCV: 95.3 fL (ref 78.0–100.0)
Monocytes Absolute: 2.3 10*3/uL — ABNORMAL HIGH (ref 0.1–1.0)
Monocytes Relative: 14 % — ABNORMAL HIGH (ref 3–12)
NEUTROS ABS: 11.3 10*3/uL — AB (ref 1.7–7.7)
NEUTROS PCT: 70 % (ref 43–77)
PLATELETS: 312 10*3/uL (ref 150–400)
RBC: 2.36 MIL/uL — ABNORMAL LOW (ref 4.22–5.81)
RDW: 21.5 % — AB (ref 11.5–15.5)
WBC: 16.2 10*3/uL — AB (ref 4.0–10.5)
nRBC: 4 /100 WBC — ABNORMAL HIGH

## 2014-02-06 LAB — TYPE AND SCREEN
ABO/RH(D): O POS
Antibody Screen: NEGATIVE
UNIT DIVISION: 0

## 2014-02-06 MED ORDER — HYDROMORPHONE HCL 2 MG/ML IJ SOLN
2.0000 mg | INTRAMUSCULAR | Status: DC | PRN
  Administered 2014-02-06 – 2014-02-07 (×5): 2 mg via INTRAVENOUS
  Filled 2014-02-06 (×5): qty 1

## 2014-02-06 NOTE — Progress Notes (Signed)
Subjective: 34 year old gentleman admitted with sickle cell painful crisis. Patient is feeling better today. His pain is down to 6 out of 10. He was feeling severe pain yesterday but today is much better. Pain is mainly in his legs now. Denied any shortness of breath or cough. Denied any fever. He is on Dilaudid PCA with physician assisted dosing as well as Dilaudid tablets. He has used 25.6 mg of the Dilaudid in the last 24 hours. He is more functional today than he has been since admission.  Objective: Vital signs in last 24 hours: Temp:  [98 F (36.7 C)-98.9 F (37.2 C)] 98.6 F (37 C) (01/07 0505) Pulse Rate:  [86-93] 92 (01/07 0505) Resp:  [12-22] 20 (01/07 0721) BP: (122-133)/(70-94) 133/94 mmHg (01/07 0505) SpO2:  [91 %-97 %] 95 % (01/07 0505) Weight change:  Last BM Date:  (PTA)  Intake/Output from previous day: 01/06 0701 - 01/07 0700 In: 925 [P.O.:480; Blood:445] Out: -  Intake/Output this shift: Total I/O In: -  Out: 250 [Urine:250]  General appearance: alert, cooperative and no distress Eyes: conjunctivae/corneas clear. PERRL, EOM's intact. Fundi benign. Ears: normal TM's and external ear canals both ears Neck: no adenopathy, no carotid bruit, no JVD, supple, symmetrical, trachea midline and thyroid not enlarged, symmetric, no tenderness/mass/nodules Back: symmetric, no curvature. ROM normal. No CVA tenderness. Resp: clear to auscultation bilaterally Chest wall: no tenderness Cardio: regular rate and rhythm, S1, S2 normal, no murmur, click, rub or gallop GI: soft, non-tender; bowel sounds normal; no masses,  no organomegaly Extremities: extremities normal, atraumatic, no cyanosis or edema Pulses: 2+ and symmetric Skin: Skin color, texture, turgor normal. No rashes or lesions Neurologic: Grossly normal  Lab Results:  Recent Labs  02/05/14 0550 02/06/14 0500  WBC 18.6* 16.2*  HGB 6.9* 7.6*  HCT 19.7* 22.5*  PLT 301 312   BMET  Recent Labs  02/04/14 0550  02/05/14 0550  NA 137 140  K 3.5 4.0  CL 110 111  CO2 22 21  GLUCOSE 118* 130*  BUN 7 8  CREATININE 0.55 0.53  CALCIUM 8.4 8.8    Studies/Results: No results found.  Medications: I have reviewed the patient's current medications.  Assessment/Plan: A 34 year old gentleman with sickle cell painful crisis.  #1 sickle cell painful crisis: Patient is doing much better today. I will discontinue the physician assisted dosing. Continue with the Dilaudid PCA today as well as Toradol. If symptoms are still better tomorrow we will discharge patient home on his home regimen.  #2 sickle cell anemia: Hemoglobin is 7.6 now. He is doing much better. We will follow closely.  #3 hypertension: Blood pressure is well controlled on current regimen. Continue treatment.  #4 chronic anticoagulation: Continues around 12.  LOS: 3 days   Jiovany Scheffel,[name] 02/06/2014, 11:20 AM

## 2014-02-07 ENCOUNTER — Telehealth: Payer: Self-pay | Admitting: Internal Medicine

## 2014-02-07 DIAGNOSIS — D5700 Hb-SS disease with crisis, unspecified: Secondary | ICD-10-CM | POA: Diagnosis not present

## 2014-02-07 DIAGNOSIS — R0781 Pleurodynia: Secondary | ICD-10-CM | POA: Diagnosis not present

## 2014-02-07 LAB — CBC WITH DIFFERENTIAL/PLATELET
BAND NEUTROPHILS: 0 % (ref 0–10)
BASOS ABS: 0.2 10*3/uL — AB (ref 0.0–0.1)
BASOS PCT: 1 % (ref 0–1)
BLASTS: 0 %
EOS PCT: 1 % (ref 0–5)
Eosinophils Absolute: 0.2 10*3/uL (ref 0.0–0.7)
HCT: 21 % — ABNORMAL LOW (ref 39.0–52.0)
HEMOGLOBIN: 7.5 g/dL — AB (ref 13.0–17.0)
Lymphocytes Relative: 24 % (ref 12–46)
Lymphs Abs: 3.9 10*3/uL (ref 0.7–4.0)
MCH: 33.2 pg (ref 26.0–34.0)
MCHC: 35.7 g/dL (ref 30.0–36.0)
MCV: 92.9 fL (ref 78.0–100.0)
MYELOCYTES: 0 %
Metamyelocytes Relative: 0 %
Monocytes Absolute: 2.8 10*3/uL — ABNORMAL HIGH (ref 0.1–1.0)
Monocytes Relative: 17 % — ABNORMAL HIGH (ref 3–12)
Neutro Abs: 9.3 10*3/uL — ABNORMAL HIGH (ref 1.7–7.7)
Neutrophils Relative %: 57 % (ref 43–77)
PROMYELOCYTES ABS: 0 %
Platelets: 274 10*3/uL (ref 150–400)
RBC: 2.26 MIL/uL — AB (ref 4.22–5.81)
RDW: 20.9 % — ABNORMAL HIGH (ref 11.5–15.5)
WBC: 16.4 10*3/uL — ABNORMAL HIGH (ref 4.0–10.5)
nRBC: 5 /100 WBC — ABNORMAL HIGH

## 2014-02-07 LAB — COMPREHENSIVE METABOLIC PANEL
ALBUMIN: 4.1 g/dL (ref 3.5–5.2)
ALK PHOS: 88 U/L (ref 39–117)
ALT: 31 U/L (ref 0–53)
AST: 48 U/L — ABNORMAL HIGH (ref 0–37)
Anion gap: 6 (ref 5–15)
BILIRUBIN TOTAL: 6.3 mg/dL — AB (ref 0.3–1.2)
BUN: 10 mg/dL (ref 6–23)
CO2: 21 mmol/L (ref 19–32)
Calcium: 8.7 mg/dL (ref 8.4–10.5)
Chloride: 112 mEq/L (ref 96–112)
Creatinine, Ser: 0.53 mg/dL (ref 0.50–1.35)
GFR calc Af Amer: >90 mL/min (ref >90)
GFR calc non Af Amer: >90 mL/min (ref >90)
Glucose, Bld: 113 mg/dL — ABNORMAL HIGH (ref 70–99)
POTASSIUM: 4.1 mmol/L (ref 3.5–5.1)
Sodium: 139 mmol/L (ref 135–145)
Total Protein: 7.2 g/dL (ref 6.0–8.3)

## 2014-02-07 MED ORDER — HEPARIN SOD (PORK) LOCK FLUSH 100 UNIT/ML IV SOLN
500.0000 [IU] | Freq: Once | INTRAVENOUS | Status: AC
  Administered 2014-02-07: 500 [IU] via INTRAVENOUS
  Filled 2014-02-07: qty 5

## 2014-02-07 NOTE — Progress Notes (Signed)
High concentration dilaudid discontinued and 53mls waisted in the sink by Dorene Ar RN and Nicole Cella, RN

## 2014-02-07 NOTE — Care Management (Signed)
Medicare Important Message given?  YES (If response is "NO", the following Medicare IM given date fields will be blank) Date Medicare IM given:  02/07/2014 Medicare IM given by:  Marney Doctor

## 2014-02-07 NOTE — Telephone Encounter (Signed)
Refill request for Dilaudid 4mg . LOV 08/22/2013. Please advise. Thanks!

## 2014-02-07 NOTE — Progress Notes (Signed)
Pt left unit without taking his Lopressor;

## 2014-02-07 NOTE — Discharge Summary (Addendum)
Physician Discharge Summary  Patient ID: [name] MRN: [id number] DOB/AGE: January 23, 1981 34 y.o.  Admit date: 02/03/2014 Discharge date: 02/07/2014  Admission Diagnoses:  Discharge Diagnoses:  Active Problems:   Functional asplenia   Hemochromatosis   Sickle cell crisis   Tenderness of left calf   Discharged Condition: good  Hospital Course: Patient was admitted with sickle cell painful crisis. He has been her pain at the level of 10 out of 10 when he was admitted. He was placed on Dilaudid PCA with physician assisted dosing as well as Toradol. He was also maintained on his Xarelto and other medications. He did very well on current treatment. At this point his pain is down to 4 out of 10 and he feels much better and wants to go home. We will proceed was discharged home today.  Consults: None  Significant Diagnostic Studies: labs: Serial CBCs and CMP was checked. Hemoglobin at his baseline   Treatments: IV hydration and analgesia: Dilaudid  Discharge Exam: Blood pressure 124/78, pulse 97, temperature 98.5 F (36.9 C), temperature source Oral, resp. rate 15, height 6' (1.829 m), weight 73.211 kg (161 lb 6.4 oz), SpO2 92 %. General appearance: alert, cooperative and no distress Eyes: conjunctivae/corneas clear. PERRL, EOM's intact. Fundi benign. Neck: no adenopathy, no carotid bruit, no JVD, supple, symmetrical, trachea midline and thyroid not enlarged, symmetric, no tenderness/mass/nodules Back: symmetric, no curvature. ROM normal. No CVA tenderness. Resp: clear to auscultation bilaterally Chest wall: no tenderness Cardio: regular rate and rhythm, S1, S2 normal, no murmur, click, rub or gallop GI: soft, non-tender; bowel sounds normal; no masses,  no organomegaly Extremities: extremities normal, atraumatic, no cyanosis or edema Pulses: 2+ and symmetric Skin: Skin color, texture, turgor normal. No rashes or lesions Neurologic: Grossly normal  Disposition: 07-Left Against  Medical Advice     Medication List    TAKE these medications        folic acid 1 MG tablet  Commonly known as:  FOLVITE  Take 1 tablet (1 mg total) by mouth every morning.     HYDROmorphone 4 MG tablet  Commonly known as:  DILAUDID  Take 1 tablet (4 mg total) by mouth every 4 (four) hours as needed for severe pain.     hydroxyurea 500 MG capsule  Commonly known as:  HYDREA  Take 3 capsules (1,500 mg total) by mouth daily. May take with food to minimize GI side effects.     lisinopril 10 MG tablet  Commonly known as:  PRINIVIL,ZESTRIL  Take 1 tablet (10 mg total) by mouth daily.     metoprolol tartrate 25 MG tablet  Commonly known as:  LOPRESSOR  Take 1 tablet (25 mg total) by mouth daily.     morphine 30 MG 12 hr tablet  Commonly known as:  MS CONTIN  Take 1 tablet (30 mg total) by mouth every 12 (twelve) hours.     potassium chloride SA 20 MEQ tablet  Commonly known as:  K-DUR,KLOR-CON  Take 1 tablet (20 mEq total) by mouth every morning.     rivaroxaban 20 MG Tabs tablet  Commonly known as:  XARELTO  Take 1 tablet (20 mg total) by mouth every morning.     zolpidem 10 MG tablet  Commonly known as:  AMBIEN  Take 1 tablet (10 mg total) by mouth at bedtime as needed for sleep.         SignedBarbette Merino 02/07/2014, 10:16 AM  Time spent 32 minutes

## 2014-02-09 ENCOUNTER — Encounter (HOSPITAL_COMMUNITY): Payer: Self-pay | Admitting: *Deleted

## 2014-02-09 ENCOUNTER — Emergency Department (HOSPITAL_COMMUNITY)
Admission: EM | Admit: 2014-02-09 | Discharge: 2014-02-09 | Disposition: A | Discharge status: Home / Self Care | Payer: Medicare Other | Attending: Emergency Medicine | Admitting: Emergency Medicine

## 2014-02-09 DIAGNOSIS — E876 Hypokalemia: Secondary | ICD-10-CM | POA: Diagnosis not present

## 2014-02-09 DIAGNOSIS — Z9114 Patient's other noncompliance with medication regimen: Secondary | ICD-10-CM | POA: Insufficient documentation

## 2014-02-09 DIAGNOSIS — Z86711 Personal history of pulmonary embolism: Secondary | ICD-10-CM | POA: Diagnosis not present

## 2014-02-09 DIAGNOSIS — Z7901 Long term (current) use of anticoagulants: Secondary | ICD-10-CM | POA: Insufficient documentation

## 2014-02-09 DIAGNOSIS — Z87891 Personal history of nicotine dependence: Secondary | ICD-10-CM | POA: Diagnosis not present

## 2014-02-09 DIAGNOSIS — Z8619 Personal history of other infectious and parasitic diseases: Secondary | ICD-10-CM | POA: Diagnosis not present

## 2014-02-09 DIAGNOSIS — Z86718 Personal history of other venous thrombosis and embolism: Secondary | ICD-10-CM | POA: Diagnosis not present

## 2014-02-09 DIAGNOSIS — Z8739 Personal history of other diseases of the musculoskeletal system and connective tissue: Secondary | ICD-10-CM | POA: Insufficient documentation

## 2014-02-09 DIAGNOSIS — F419 Anxiety disorder, unspecified: Secondary | ICD-10-CM | POA: Diagnosis not present

## 2014-02-09 DIAGNOSIS — Z79899 Other long term (current) drug therapy: Secondary | ICD-10-CM | POA: Insufficient documentation

## 2014-02-09 DIAGNOSIS — I10 Essential (primary) hypertension: Secondary | ICD-10-CM | POA: Diagnosis not present

## 2014-02-09 DIAGNOSIS — D5700 Hb-SS disease with crisis, unspecified: Secondary | ICD-10-CM | POA: Diagnosis not present

## 2014-02-09 LAB — CBC WITH DIFFERENTIAL/PLATELET
BASOS PCT: 0 % (ref 0–1)
Basophils Absolute: 0 10*3/uL (ref 0.0–0.1)
EOS ABS: 0.2 10*3/uL (ref 0.0–0.7)
EOS PCT: 1 % (ref 0–5)
HCT: 24.5 % — ABNORMAL LOW (ref 39.0–52.0)
Hemoglobin: 8.6 g/dL — ABNORMAL LOW (ref 13.0–17.0)
Lymphocytes Relative: 10 % — ABNORMAL LOW (ref 12–46)
Lymphs Abs: 1.7 10*3/uL (ref 0.7–4.0)
MCH: 33.2 pg (ref 26.0–34.0)
MCHC: 35.1 g/dL (ref 30.0–36.0)
MCV: 94.6 fL (ref 78.0–100.0)
MONO ABS: 1.5 10*3/uL — AB (ref 0.1–1.0)
MONOS PCT: 9 % (ref 3–12)
NEUTROS PCT: 80 % — AB (ref 43–77)
Neutro Abs: 13.8 10*3/uL — ABNORMAL HIGH (ref 1.7–7.7)
Platelets: 344 10*3/uL (ref 150–400)
RBC: 2.59 MIL/uL — ABNORMAL LOW (ref 4.22–5.81)
RDW: 22.2 % — ABNORMAL HIGH (ref 11.5–15.5)
WBC: 17.2 10*3/uL — AB (ref 4.0–10.5)

## 2014-02-09 LAB — COMPREHENSIVE METABOLIC PANEL
ALK PHOS: 83 U/L (ref 39–117)
ALT: 27 U/L (ref 0–53)
AST: 47 U/L — ABNORMAL HIGH (ref 0–37)
Albumin: 4 g/dL (ref 3.5–5.2)
Anion gap: 9 (ref 5–15)
BILIRUBIN TOTAL: 8.3 mg/dL — AB (ref 0.3–1.2)
BUN: 5 mg/dL — AB (ref 6–23)
CALCIUM: 8.9 mg/dL (ref 8.4–10.5)
CO2: 23 mmol/L (ref 19–32)
Chloride: 107 mEq/L (ref 96–112)
Creatinine, Ser: 0.38 mg/dL — ABNORMAL LOW (ref 0.50–1.35)
GLUCOSE: 114 mg/dL — AB (ref 70–99)
POTASSIUM: 3.8 mmol/L (ref 3.5–5.1)
SODIUM: 139 mmol/L (ref 135–145)
TOTAL PROTEIN: 7.3 g/dL (ref 6.0–8.3)

## 2014-02-09 LAB — URINALYSIS, ROUTINE W REFLEX MICROSCOPIC
BILIRUBIN URINE: NEGATIVE
Glucose, UA: NEGATIVE mg/dL
KETONES UR: NEGATIVE mg/dL
Leukocytes, UA: NEGATIVE
Nitrite: NEGATIVE
Protein, ur: 30 mg/dL — AB
SPECIFIC GRAVITY, URINE: 1.011 (ref 1.005–1.030)
Urobilinogen, UA: 1 mg/dL (ref 0.0–1.0)
pH: 7.5 (ref 5.0–8.0)

## 2014-02-09 LAB — PROTIME-INR
INR: 1.15 (ref 0.00–1.49)
PROTHROMBIN TIME: 14.8 s (ref 11.6–15.2)

## 2014-02-09 LAB — RETICULOCYTES
RBC.: 2.59 MIL/uL — ABNORMAL LOW (ref 4.22–5.81)
Retic Count, Absolute: 562 10*3/uL — ABNORMAL HIGH (ref 19.0–186.0)
Retic Ct Pct: 21.7 % — ABNORMAL HIGH (ref 0.4–3.1)

## 2014-02-09 LAB — URINE MICROSCOPIC-ADD ON

## 2014-02-09 LAB — TROPONIN I: TROPONIN I: 0.17 ng/mL — AB (ref <0.031)

## 2014-02-09 MED ORDER — HYDROMORPHONE HCL 2 MG/ML IJ SOLN
2.0000 mg | Freq: Once | INTRAMUSCULAR | Status: AC
  Administered 2014-02-09: 2 mg via INTRAVENOUS
  Filled 2014-02-09: qty 1

## 2014-02-09 MED ORDER — DIPHENHYDRAMINE HCL 50 MG/ML IJ SOLN
25.0000 mg | Freq: Once | INTRAMUSCULAR | Status: AC
  Administered 2014-02-09: 25 mg via INTRAVENOUS
  Filled 2014-02-09: qty 1

## 2014-02-09 MED ORDER — HYDROMORPHONE HCL 2 MG/ML IJ SOLN
2.0000 mg | INTRAMUSCULAR | Status: DC | PRN
  Administered 2014-02-09 (×2): 2 mg via INTRAVENOUS
  Filled 2014-02-09 (×3): qty 1

## 2014-02-09 MED ORDER — SODIUM CHLORIDE 0.9 % IV BOLUS (SEPSIS)
1000.0000 mL | Freq: Once | INTRAVENOUS | Status: AC
  Administered 2014-02-09: 1000 mL via INTRAVENOUS

## 2014-02-09 MED ORDER — HEPARIN SOD (PORK) LOCK FLUSH 100 UNIT/ML IV SOLN
500.0000 [IU] | Freq: Once | INTRAVENOUS | Status: AC
  Administered 2014-02-09: 500 [IU]
  Filled 2014-02-09: qty 5

## 2014-02-09 MED ORDER — SODIUM CHLORIDE 0.9 % IV SOLN
INTRAVENOUS | Status: DC
  Administered 2014-02-09: 16:00:00 via INTRAVENOUS

## 2014-02-09 MED ORDER — HYDROMORPHONE HCL 2 MG/ML IJ SOLN
2.0000 mg | Freq: Once | INTRAMUSCULAR | Status: AC
  Administered 2014-02-09: 2 mg via INTRAVENOUS

## 2014-02-09 NOTE — ED Notes (Signed)
Pt reports SSC since this am. Sts he was just d/c from hospital Fri night and was fine until today. Woke up vomiting x2. C/o back and side pain. Denies sob, chest pain.

## 2014-02-09 NOTE — ED Notes (Signed)
Pt provided with a sandwich per request

## 2014-02-09 NOTE — ED Provider Notes (Signed)
Pain is better, wants to be discharged to try to control pain at home.  Ephraim Hamburger, MD 02/09/14 [phone]

## 2014-02-09 NOTE — ED Provider Notes (Signed)
CSN: [id number]     Arrival date & time 02/09/14  1245 History   First MD Initiated Contact with Patient 02/09/14 1304     Chief Complaint  Patient presents with  . Sickle Cell Pain Crisis     (Consider location/radiation/quality/duration/timing/severity/associated sxs/prior Treatment) HPI Patient has had rebound of sickle cell pain starting yesterday. He reports he was just discharged from the hospital 2 days ago. His pain is in his left flank and lower back area. It is constant severe aching. He denies any associated chest pain or shortness of breath. He denies associated vomiting diarrhea or abdominal pain. He denies any fever. Past Medical History  Diagnosis Date  . Sickle cell anemia   . Blood transfusion   . Acute embolism and thrombosis of right internal jugular vein   . Hypokalemia   . Mood disorder   . History of pulmonary embolus (PE)   . Avascular necrosis   . Leukocytosis     Chronic  . Thrombocytosis     Chronic  . Hypertension   . History of Clostridium difficile infection   . Uses marijuana   . Chronic anticoagulation   . Functional asplenia   . Former smoker   . Second hand tobacco smoke exposure   . Alcohol consumption of one to four drinks per day   . Noncompliance with medication regimen   . Sickle-cell crisis with associated acute chest syndrome 05/13/2013  . Acute chest syndrome 06/18/2013  . Demand ischemia 01/02/2014   Past Surgical History  Procedure Laterality Date  . Right hip replacement      08/2006  . Cholecystectomy      01/2008  . Porta cath placement    . Porta cath removal    . Umbilical hernia repair      01/2008  . Excision of left periauricular cyst      10/2009  . Excision of right ear lobe cyst with primary closur      11/2007  . Portacath placement  01/05/2012    Procedure: INSERTION PORT-A-CATH;  Surgeon: Odis Hollingshead, MD;  Location: [place];  Service: General;  Laterality: N/A;  ultrasound guiced port a cath insertion with  fluoroscopy   Family History  Problem Relation Age of Onset  . Sickle cell anemia Mother   . Sickle cell anemia Father   . Sickle cell trait Brother    History  Substance Use Topics  . Smoking status: Former Smoker -- 13 years    Quit date: 07/08/2010  . Smokeless tobacco: Never Used  . Alcohol Use: No    Review of Systems 10 Systems reviewed and are negative for acute change except as noted in the HPI.    Allergies  Morphine and related  Home Medications   Prior to Admission medications   Medication Sig Start Date End Date Taking? Authorizing Provider  folic acid (FOLVITE) 1 MG tablet Take 1 tablet (1 mg total) by mouth every morning. 10/08/13  Yes [name], MD  HYDROmorphone (DILAUDID) 4 MG tablet Take 1 tablet (4 mg total) by mouth every 4 (four) hours as needed for severe pain. 12/24/13  Yes [name], MD  hydroxyurea (HYDREA) 500 MG capsule Take 3 capsules (1,500 mg total) by mouth daily. May take with food to minimize GI side effects. 10/30/13  Yes [name], MD  lisinopril (PRINIVIL,ZESTRIL) 10 MG tablet Take 1 tablet (10 mg total) by mouth daily. 01/22/14  Yes [name], MD  metoprolol  tartrate (LOPRESSOR) 25 MG tablet Take 1 tablet (25 mg total) by mouth daily. 08/22/13  Yes [name], MD  morphine (MS CONTIN) 30 MG 12 hr tablet Take 1 tablet (30 mg total) by mouth every 12 (twelve) hours. 01/22/14  Yes [name], MD  potassium chloride SA (K-DUR,KLOR-CON) 20 MEQ tablet Take 1 tablet (20 mEq total) by mouth every morning. 09/25/13  Yes [name], MD  rivaroxaban (XARELTO) 20 MG TABS tablet Take 1 tablet (20 mg total) by mouth every morning. 01/06/14  Yes [name], MD  zolpidem (AMBIEN) 10 MG tablet Take 1 tablet (10 mg total) by mouth at bedtime as needed for sleep. 10/28/13  Yes Dorena Dew, FNP   BP 122/79 mmHg  Pulse 103  Temp(Src) 98.5 F (36.9 C) (Oral)  Resp 18  SpO2 88% Physical  Exam  Constitutional: He is oriented to person, place, and time.  The patient is very thin in appearance he is sitting at the edge of the stretcher. He has anxious appearance with jittering his legs. He has no shortness of breath. His mental status is clear.  HENT:  Head: Normocephalic and atraumatic.  Eyes: EOM are normal. Pupils are equal, round, and reactive to light.  Neck: Neck supple.  Cardiovascular: Normal rate, regular rhythm, normal heart sounds and intact distal pulses.   Pulmonary/Chest: Effort normal and breath sounds normal.  Abdominal: Soft. Bowel sounds are normal. He exhibits no distension. There is no tenderness.  Musculoskeletal: Normal range of motion. He exhibits no edema.  Neurological: He is alert and oriented to person, place, and time. He has normal strength. Coordination normal. GCS eye subscore is 4. GCS verbal subscore is 5. GCS motor subscore is 6.  Skin: Skin is warm, dry and intact.  Psychiatric:  Patient appears anxious.    ED Course  Procedures (including critical care time) Labs Review Labs Reviewed  COMPREHENSIVE METABOLIC PANEL - Abnormal; Notable for the following:    Glucose, Bld 114 (*)    BUN 5 (*)    Creatinine, Ser 0.38 (*)    AST 47 (*)    Total Bilirubin 8.3 (*)    All other components within normal limits  TROPONIN I - Abnormal; Notable for the following:    Troponin I 0.17 (*)    All other components within normal limits  CBC WITH DIFFERENTIAL - Abnormal; Notable for the following:    WBC 17.2 (*)    RBC 2.59 (*)    Hemoglobin 8.6 (*)    HCT 24.5 (*)    RDW 22.2 (*)    Neutrophils Relative % 80 (*)    Lymphocytes Relative 10 (*)    Neutro Abs 13.8 (*)    Monocytes Absolute 1.5 (*)    All other components within normal limits  RETICULOCYTES - Abnormal; Notable for the following:    Retic Ct Pct 21.7 (*)    RBC. 2.59 (*)    Retic Count, Manual 562.0 (*)    All other components within normal limits  URINALYSIS, ROUTINE W REFLEX  MICROSCOPIC - Abnormal; Notable for the following:    Color, Urine AMBER (*)    Hgb urine dipstick SMALL (*)    Protein, ur 30 (*)    All other components within normal limits  PROTIME-INR  URINE MICROSCOPIC-ADD ON    Imaging Review No results found.   EKG Interpretation None     15:20 Pain improving. Up and about in the room. No distress  16:30 The patient still feels that he is improving. He reports if his pain comes down a little more he would like try to go home. He reports he does not want to be admitted to the hospital. MDM   Final diagnoses:  Sickle cell crisis   At this point time the patient is showing signs of improvement. He does have known significant sickle cell disease. The patient is improving and at this point time does not want to be admitted to the hospital for ongoing treatment. He is not expressing chest pain or shortness of breath. He has not been febrile. At this point if patient achieves adequate pain control, the plan will be for discharge home with close follow-up.    Charlesetta Shanks, MD 02/14/14 [phone]

## 2014-02-09 NOTE — Discharge Instructions (Signed)
Sickle Cell Anemia, Adult °Sickle cell anemia is a condition in which red blood cells have an abnormal "sickle" shape. This abnormal shape shortens the cells' life span, which results in a lower than normal concentration of red blood cells in the blood. The sickle shape also causes the cells to clump together and block free blood flow through the blood vessels. As a result, the tissues and organs of the body do not receive enough oxygen. Sickle cell anemia causes organ damage and pain and increases the risk of infection. °CAUSES  °Sickle cell anemia is a genetic disorder. Those who receive two copies of the gene have the condition, and those who receive one copy have the trait. °RISK FACTORS °The sickle cell gene is most common in people whose families originated in [place]. Other areas of the globe where sickle cell trait occurs include the [place], [place] and [place], the [place], and the [place].  °SIGNS AND SYMPTOMS °· Pain, especially in the extremities, back, chest, or abdomen (common). The pain may start suddenly or may develop following an illness, especially if there is dehydration. Pain can also occur due to overexertion or exposure to extreme temperature changes. °· Frequent severe bacterial infections, especially certain types of pneumonia and meningitis. °· Pain and swelling in the hands and feet. °· Decreased activity.   °· Loss of appetite.   °· Change in behavior. °· Headaches. °· Seizures. °· Shortness of breath or difficulty breathing. °· Vision changes. °· Skin ulcers. °Those with the trait may not have symptoms or they may have mild symptoms.  °DIAGNOSIS  °Sickle cell anemia is diagnosed with blood tests that demonstrate the genetic trait. It is often diagnosed during the newborn period, due to mandatory testing nationwide. A variety of blood tests, X-rays, CT scans, MRI scans, ultrasounds, and lung function tests may also be done to monitor the condition. °TREATMENT  °Sickle  cell anemia may be treated with: °· Medicines. You may be given pain medicines, antibiotic medicines (to treat and prevent infections) or medicines to increase the production of certain types of hemoglobin. °· Fluids. °· Oxygen. °· Blood transfusions. °HOME CARE INSTRUCTIONS  °· Drink enough fluid to keep your urine clear or pale yellow. Increase your fluid intake in hot weather and during exercise. °· Do not smoke. Smoking lowers oxygen levels in the blood.   °· Only take over-the-counter or prescription medicines for pain, fever, or discomfort as directed by your health care provider. °· Take antibiotics as directed by your health care provider. Make sure you finish them it even if you start to feel better.   °· Take supplements as directed by your health care provider.   °· Consider wearing a medical alert bracelet. This tells anyone caring for you in an emergency of your condition.   °· When traveling, keep your medical information, health care provider's names, and the medicines you take with you at all times.   °· If you develop a fever, do not take medicines to reduce the fever right away. This could cover up a problem that is developing. Notify your health care provider. °· Keep all follow-up appointments with your health care provider. Sickle cell anemia requires regular medical care. °SEEK MEDICAL CARE IF: ° You have a fever. °SEEK IMMEDIATE MEDICAL CARE IF:  °· You feel dizzy or faint.   °· You have new abdominal pain, especially on the left side near the stomach area.   °· You develop a persistent, often uncomfortable and painful penile erection (priapism). If this is not treated immediately it   will lead to impotence.   °· You have numbness your arms or legs or you have a hard time moving them.   °· You have a hard time with speech.   °· You have a fever or persistent symptoms for more than 2-3 days.   °· You have a fever and your symptoms suddenly get worse.   °· You have signs or symptoms of infection.  These include:   °¨ Chills.   °¨ Abnormal tiredness (lethargy).   °¨ Irritability.   °¨ Poor eating.   °¨ Vomiting.   °· You develop pain that is not helped with medicine.   °· You develop shortness of breath. °· You have pain in your chest.   °· You are coughing up pus-like or bloody sputum.   °· You develop a stiff neck. °· Your feet or hands swell or have pain. °· Your abdomen appears bloated. °· You develop joint pain. °MAKE SURE YOU: °· Understand these instructions. °Document Released: 04/27/2005 Document Revised: 06/03/2013 Document Reviewed: 08/29/2012 °[employer]® Patient Information ©2015 [employer]. This information is not intended to replace advice given to you by your health care provider. Make sure you discuss any questions you have with your health care provider. ° °

## 2014-02-10 ENCOUNTER — Other Ambulatory Visit: Payer: Self-pay

## 2014-02-11 MED ORDER — HYDROMORPHONE HCL 4 MG PO TABS: 4.0000 mg | ORAL_TABLET | ORAL | Status: DC | PRN

## 2014-02-11 NOTE — Telephone Encounter (Signed)
Prescription re-written for Dilaudid 4 mg #90 tabs. [facility] reviewed and no inconsistencies noted.

## 2014-02-13 ENCOUNTER — Encounter: Payer: Self-pay | Admitting: Internal Medicine

## 2014-02-13 ENCOUNTER — Ambulatory Visit (INDEPENDENT_AMBULATORY_CARE_PROVIDER_SITE_OTHER): Payer: Medicare Other | Admitting: Internal Medicine

## 2014-02-13 VITALS — BP 111/68 | HR 98 | Temp 98.2°F | Resp 16 | Ht 72.0 in | Wt 162.0 lb

## 2014-02-13 DIAGNOSIS — E876 Hypokalemia: Secondary | ICD-10-CM

## 2014-02-13 DIAGNOSIS — D571 Sickle-cell disease without crisis: Secondary | ICD-10-CM

## 2014-02-13 DIAGNOSIS — R739 Hyperglycemia, unspecified: Secondary | ICD-10-CM

## 2014-02-13 DIAGNOSIS — Z1211 Encounter for screening for malignant neoplasm of colon: Secondary | ICD-10-CM

## 2014-02-13 DIAGNOSIS — E559 Vitamin D deficiency, unspecified: Secondary | ICD-10-CM

## 2014-02-13 DIAGNOSIS — I270 Primary pulmonary hypertension: Secondary | ICD-10-CM | POA: Diagnosis not present

## 2014-02-13 DIAGNOSIS — I10 Essential (primary) hypertension: Secondary | ICD-10-CM

## 2014-02-13 DIAGNOSIS — Z0000 Encounter for general adult medical examination without abnormal findings: Secondary | ICD-10-CM

## 2014-02-13 DIAGNOSIS — N508 Other specified disorders of male genital organs: Secondary | ICD-10-CM

## 2014-02-13 DIAGNOSIS — N5089 Other specified disorders of the male genital organs: Secondary | ICD-10-CM

## 2014-02-13 DIAGNOSIS — I272 Pulmonary hypertension, unspecified: Secondary | ICD-10-CM

## 2014-02-13 DIAGNOSIS — R0609 Other forms of dyspnea: Secondary | ICD-10-CM

## 2014-02-13 DIAGNOSIS — R809 Proteinuria, unspecified: Secondary | ICD-10-CM

## 2014-02-13 MED ORDER — VITAMIN D 50 MCG (2000 UT) PO TABS: 2000.0000 [IU] | ORAL_TABLET | Freq: Every day | ORAL | Status: DC

## 2014-02-13 NOTE — Progress Notes (Signed)
Patient ID: [name], male   DOB: April 03, 1980, 34 y.o.   MRN: [id number]  ANNUAL PREVENTATIVE VISIT AND CPE  Subjective:  [name] is a 34 y.o. male who presents for  Annual preventative visit and complete physical.  Date of last Annual Physical visit is unknown.  He has had elevated blood pressure for more than 3 years. His blood pressure has been controlled at home, today their BP is BP: 111/68 mmHg He does not workout. He denies chest pain, shortness of breath, dizziness, but does have some DOE in a setting of known Pulmonary HTN.  He is not on cholesterol medication.Marland Kitchen His cholesterol is at goal. The cholesterol last visit was:   Lab Results  Component Value Date   CHOL 63 06/04/2013   HDL 24* 06/04/2013   LDLCALC 26 06/04/2013   TRIG 63 06/04/2013   CHOLHDL 2.6 06/04/2013   He does not have known diabetes. He has not been diagnosed with prediabetes but has been noted to have hyperglycemia but he  denies hypoglycemia , polydipsia and polyuria. A Hb A1c is not appropriate to evaluate for Diabetes in this patient due to the frequent RBC turnover associated with Sickle Cell Disease. Patient is on Vitamin D supplement.   Lab Results  Component Value Date   VD25OH 10* 05/18/2013       Names of Other Physician/Practitioners you currently use: 1. [facility] here for primary care 2. No eye doctor. Pt has been referred but has not kept his appointments last. 3. No recent , dental visit.   Patient Care Team: [name], MD as PCP - General (Internal Medicine)   Medication Review: Current Outpatient Prescriptions on File Prior to Visit  Medication Sig Dispense Refill  . folic acid (FOLVITE) 1 MG tablet Take 1 tablet (1 mg total) by mouth every morning. 30 tablet 11  . HYDROmorphone (DILAUDID) 4 MG tablet Take 1 tablet (4 mg total) by mouth every 4 (four) hours as needed for severe pain. 90 tablet 0  . hydroxyurea (HYDREA) 500 MG capsule Take 3  capsules (1,500 mg total) by mouth daily. May take with food to minimize GI side effects. 60 capsule 3  . lisinopril (PRINIVIL,ZESTRIL) 10 MG tablet Take 1 tablet (10 mg total) by mouth daily. 30 tablet 1  . metoprolol tartrate (LOPRESSOR) 25 MG tablet Take 1 tablet (25 mg total) by mouth daily. 30 tablet 6  . morphine (MS CONTIN) 30 MG 12 hr tablet Take 1 tablet (30 mg total) by mouth every 12 (twelve) hours. 60 tablet 0  . potassium chloride SA (K-DUR,KLOR-CON) 20 MEQ tablet Take 1 tablet (20 mEq total) by mouth every morning. 30 tablet 3  . rivaroxaban (XARELTO) 20 MG TABS tablet Take 1 tablet (20 mg total) by mouth every morning. 30 tablet 3  . zolpidem (AMBIEN) 10 MG tablet Take 1 tablet (10 mg total) by mouth at bedtime as needed for sleep. 30 tablet 3   No current facility-administered medications on file prior to visit.    Current Problems (verified) Patient Active Problem List   Diagnosis Date Noted  . Pulmonary HTN 06/18/2013    Priority: High  . Functional asplenia     Priority: High  . 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 Avascular necrosis     Priority: Medium  . DOE (dyspnea on exertion) 02/13/2014  . 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 Pulmonary embolus 01/01/2014  . Paralytic  strabismus, external ophthalmoplegia   . Chronic pain syndrome 12/12/2013  . Anemia 09/22/2013  . Chronic anticoagulation 08/22/2013  . HTN (hypertension) 08/22/2013  . Thrombocytosis 07/01/2013  . Hypokalemia 04/22/2013  . Vitamin D deficiency 02/13/2013  . Uses marijuana 01/13/2013  . Leukocytosis, unspecified 08/30/2012  . Rib pain 08/06/2012  . Embolism, pulmonary with infarction 07/09/2012  . Hx of pulmonary embolus 06/29/2012  . Hemochromatosis 12/14/2011    Screening Tests Health Maintenance  Topic Date Due  . INFLUENZA VACCINE  09/01/2014  . TETANUS/TDAP  11/22/2022    Immunization History  Administered Date(s) Administered  . Influenza Split  01/15/2012  . Influenza,inj,Quad PF,36+ Mos 11/14/2012, 10/28/2013  . Pneumococcal Polysaccharide-23 01/15/2012  . Tdap 11/21/2012    Preventative care: Pt not at age for colonoscopy screening and has no factors that would make him high risk. Prior vaccinations: TD or Tdap: 10/22/12014  Influenza: 11/14/2013  Pneumococcal: 01/15/2012 Prevnar13: Due but refused on this visit Shingles/Zostavax: N/A    Medication List       This list is accurate as of: 02/13/14  4:36 PM.  Always use your most recent med list.               folic acid 1 MG tablet  Commonly known as:  FOLVITE  Take 1 tablet (1 mg total) by mouth every morning.     HYDROmorphone 4 MG tablet  Commonly known as:  DILAUDID  Take 1 tablet (4 mg total) by mouth every 4 (four) hours as needed for severe pain.     hydroxyurea 500 MG capsule  Commonly known as:  HYDREA  Take 3 capsules (1,500 mg total) by mouth daily. May take with food to minimize GI side effects.     lisinopril 10 MG tablet  Commonly known as:  PRINIVIL,ZESTRIL  Take 1 tablet (10 mg total) by mouth daily.     metoprolol tartrate 25 MG tablet  Commonly known as:  LOPRESSOR  Take 1 tablet (25 mg total) by mouth daily.     morphine 30 MG 12 hr tablet  Commonly known as:  MS CONTIN  Take 1 tablet (30 mg total) by mouth every 12 (twelve) hours.     potassium chloride SA 20 MEQ tablet  Commonly known as:  K-DUR,KLOR-CON  Take 1 tablet (20 mEq total) by mouth every morning.     rivaroxaban 20 MG Tabs tablet  Commonly known as:  XARELTO  Take 1 tablet (20 mg total) by mouth every morning.     Vitamin D 2000 UNITS tablet  Take 1 tablet (2,000 Units total) by mouth daily.     zolpidem 10 MG tablet  Commonly known as:  AMBIEN  Take 1 tablet (10 mg total) by mouth at bedtime as needed for sleep.        Past Surgical History  Procedure Laterality Date  . Right hip replacement      08/2006  . Cholecystectomy      01/2008  . Porta cath  placement    . Porta cath removal    . Umbilical hernia repair      01/2008  . Excision of left periauricular cyst      10/2009  . Excision of right ear lobe cyst with primary closur      11/2007  . Portacath placement  01/05/2012    Procedure: INSERTION PORT-A-CATH;  Surgeon: Odis Hollingshead, MD;  Location: [facility];  Service: General;  Laterality: N/A;  ultrasound guiced port a  cath insertion with fluoroscopy   Family History  Problem Relation Age of Onset  . Sickle cell trait Mother   . Depression Mother   . Sickle cell trait Father   . Sickle cell trait Brother     History reviewed: allergies, current medications, past family history, past medical history, past social history, past surgical history and problem list   Risk Factors: Osteoporosis/FallRisk: Pt has vitamin D deficiency associated with Hb SS and Avascular necrosis. In the past year have you fallen or had a near fall?:No History of fracture in the past year: no  Tobacco History  Substance Use Topics  . Smoking status: Former Smoker -- 13 years    Quit date: 07/08/2010  . Smokeless tobacco: Never Used  . Alcohol Use: 1.2 oz/week    2 Shots of liquor per week   He does not smoke.  Patient is a former smoker. Are there smokers in your home (other than you)?  Yes  Alcohol Current alcohol use: social drinker  Caffeine Current caffeine use: denies use  Exercise Current exercise: no regular exercise  Nutrition/Diet Current diet: in general, an "unhealthy" diet  Cardiac risk factors: hypertension, male gender, smoking/ tobacco exposure and Pulmonary HTN associated with Hb SS.  Depression Screen (Note: if answer to either of the following is "Yes", a more complete depression screening is indicated)   Q1: Over the past two weeks, have you felt down, depressed or hopeless? No.  Q2: Over the past two weeks, have you felt little interest or pleasure in doing things? No  Have you lost interest or pleasure in  daily life? No  Do you often feel hopeless? No  Do you cry easily over simple problems? No  Activities of Daily Living In your present state of health, do you have any difficulty performing the following activities?:  Driving? No Managing money?  No Feeding yourself? No Getting from bed to chair? No Climbing a flight of stairs? No Preparing food and eating?: No Bathing or showering? No Getting dressed: No Getting to the toilet? No Using the toilet:No Moving around from place to place: No In the past year have you fallen or had a near fall?:No   Are you sexually active?  No  Do you have more than one partner?  No  Vision Difficulties: No  Hearing Difficulties: No Do you often ask people to speak up or repeat themselves? No Do you experience ringing or noises in your ears? No Do you have difficulty understanding soft or whispered voices? No  Cognition  Do you feel that you have a problem with memory?No  Do you often misplace items? Yes  Do you feel safe at home?  Yes  Advanced directives Does patient have a [place]? No Does patient have a Living Will? No   Objective:    General appearance: alert, no distress, WD/WN, male Cognitive Testing  Alert? Yes  Normal Appearance?Yes  Oriented to person? Yes  Place? Yes   Time? Yes  Recall of three objects?  Yes  Can perform simple calculations? Yes  Displays appropriate judgment?Yes  Can read the correct time from a watch face?Yes  HEENT: normocephalic,  Mild sclerae icterus, TMs pearly, nares patent, no discharge or erythema, pharynx normal, geographic tongue Vital Signs:BP 111/68, HR 98, T 98.2 F (36.8 C), temperature source Oral, RR 16, height 6' (1.829 m), weight 162 lb (73.483 kg), SpO2 92 %. BMI is 21.97 kg/(m^2). Oral cavity: MMM, no lesions, geographic tongue.  Neck: supple, no lymphadenopathy, no thyromegaly, no masses Heart: RRR, normal S1, S2, II/VI murmur at left base. Lungs: CTA  bilaterally, no wheezes, rhonchi, or rales Abdomen: +bs, soft, non tender, non distended, no masses, mild hepatomegaly, no splenomegaly Musculoskeletal: nontender, no swelling, no obvious deformity Extremities: no edema, no cyanosis, no clubbing Pulses: 2+ symmetric, upper and lower extremities, normal cap refill Neurological: alert, oriented x 3, CN2-12 intact, strength normal upper extremities and lower extremities, sensation normal throughout, DTRs 2+ throughout, no cerebellar signs, gait normal Testicular: B/L testicle descended. Small mass noted in right testicle. Rectal/Prostate: No nodules or localized areas of softness, tenderness or induration palpated. Rectal tone normal. No external or internal hemorrhoids noted. Psychiatric: normal affect, behavior normal, pleasant    Assessment:  Patient denies any difficulties at home. No trouble with ADLs, depression or falls. No recent changes to vision or hearing. Is UTD with immunizations. Is UTD with screening. Discussed Advanced Directives, patient agrees to bring [place] copies of documents if can. Encouraged heart healthy diet, exercise as tolerated and adequate sleep. Declines flu shot. Testicular and rectal examination done today.     Plan:  During the course of the visit the patient was educated and counseled about appropriate screening and preventive services including:    Pneumococcal vaccine   Influenza vaccine  Td vaccine  Screening electrocardiogram  Colorectal cancer screening  Diabetes screening  Nutrition counseling   Advanced directives: requested  Glaucoma Screening   1. Annual physical exam - Pt needs Prevnar-13 which can be given at any time. - Fecal occult blood, imunochemical - EKG 12-Lead  2. Special screening for malignant neoplasms, colon - Fecal occult blood, imunochemical  3. Hb-SS disease without crisis - Pt on Hydrea 8527 mg daily and Folic acid 1 mg daily. His pain is currently controlled on  MS Contin 30 mg BID and Dilaudid 4 mg every 4 hours as needed. We have agreed to Dilaudid 4 mg # 90 tabs every 3 weeks. He is aware that he must receive Opiates only from our office and that his opiate distribution in [place] can and will be reviewed on [place].  - Needs Vision examination. Will refer  4. Pulmonary HTN - A referral has been made to Pulmonary HTN clinic at [facility]. He is counseled against smoking particularly in the context of this complication. - EKG 78-EUMP - Referral has been placed to Cardiology Pulmonary HTN clinic  5. Vitamin D deficiency - Pt has not been compliant with vitamin D supplementation. I have explained to him that this deficiency in the setting of bone infarcts and smoking places him at risk for fractures and osteoporosis - Cholecalciferol (VITAMIN D) 2000 UNITS tablet; Take 1 tablet (2,000 Units total) by mouth daily.  Dispense: 90 tablet; Refill: 3  6. Hypokalemia - Pt has chronic hypokalemia and is no supplementation.  7. Hemochromatosis - Secondary to multiple PRBC transfusions -  Last Ferritin levels in ambulatory setting was 2334. Pt prescribed but not adherent to Exjade or Jadenu. - Ferritin  8. Hyperglycemia - Pt at risk for Diabetes in light of hyperglycemia. - Fructosamine  9. DOE (dyspnea on exertion) - Likely associated with Pulmonary Hypertension.  10. Testicular mass - US Scrotum; Future  11. Proteinuria - Secondary to nephropathy of Sickle Cell Disease. Continue Lisinopril.  12. 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 He is on Xarelto for chronic anticoagulation. No evidence of active bleeding.   Screening recommendations, referrals: Vaccinations:  Recommend Prevnar. However patient declined at this time due to time constraints. Please see documentation below and orders this visit.  Nutrition assessed and recommended  Colonoscopy not indicated Recommended yearly ophthalmology/optometry  visit for glaucoma and retinopathy of Sickle Cell screening and checkup Recommended yearly dental visit for hygiene and checkup Advanced directives - requested  Conditions/risks identified: BMI: Discussed weight loss, diet, and increase physical activity.  Increase physical activity: AHA recommends 150 minutes of physical activity a week.  Medications reviewed Proteinuria  is not at goal, ACE/ARB therapy: Yes. Fall risk: low- discussed medications.    The patient's weight, height, BMI, and visual acuity have been recorded in the chart.  I have made referrals, counseling, and provided education to the patient based on review of the above and I have provided the patient with a written personalized care plan for preventive services.     Uel Davidow A., MD   02/13/2014

## 2014-02-14 LAB — FERRITIN: Ferritin: 2443 ng/mL — ABNORMAL HIGH (ref 22–322)

## 2014-02-17 LAB — FRUCTOSAMINE: FRUCTOSAMINE: 297 umol/L — AB (ref 190–270)

## 2014-02-18 ENCOUNTER — Non-Acute Institutional Stay (HOSPITAL_COMMUNITY)
Admission: AD | Admit: 2014-02-18 | Discharge: 2014-02-18 | Disposition: A | Discharge status: Home / Self Care | Payer: Medicare Other | Attending: Internal Medicine | Admitting: Internal Medicine

## 2014-02-18 ENCOUNTER — Telehealth (HOSPITAL_COMMUNITY): Payer: Self-pay | Admitting: Hematology

## 2014-02-18 ENCOUNTER — Encounter (HOSPITAL_COMMUNITY): Payer: Self-pay | Admitting: Hematology

## 2014-02-18 DIAGNOSIS — D5700 Hb-SS disease with crisis, unspecified: Secondary | ICD-10-CM | POA: Diagnosis not present

## 2014-02-18 DIAGNOSIS — Z79899 Other long term (current) drug therapy: Secondary | ICD-10-CM | POA: Insufficient documentation

## 2014-02-18 DIAGNOSIS — Z7901 Long term (current) use of anticoagulants: Secondary | ICD-10-CM | POA: Insufficient documentation

## 2014-02-18 DIAGNOSIS — Z86711 Personal history of pulmonary embolism: Secondary | ICD-10-CM | POA: Insufficient documentation

## 2014-02-18 DIAGNOSIS — F1210 Cannabis abuse, uncomplicated: Secondary | ICD-10-CM | POA: Insufficient documentation

## 2014-02-18 DIAGNOSIS — E876 Hypokalemia: Secondary | ICD-10-CM | POA: Insufficient documentation

## 2014-02-18 DIAGNOSIS — Z87891 Personal history of nicotine dependence: Secondary | ICD-10-CM | POA: Insufficient documentation

## 2014-02-18 DIAGNOSIS — F39 Unspecified mood [affective] disorder: Secondary | ICD-10-CM | POA: Insufficient documentation

## 2014-02-18 DIAGNOSIS — I10 Essential (primary) hypertension: Secondary | ICD-10-CM | POA: Insufficient documentation

## 2014-02-18 DIAGNOSIS — Z9114 Patient's other noncompliance with medication regimen: Secondary | ICD-10-CM | POA: Diagnosis not present

## 2014-02-18 DIAGNOSIS — R0781 Pleurodynia: Secondary | ICD-10-CM | POA: Diagnosis present

## 2014-02-18 LAB — CBC WITH DIFFERENTIAL/PLATELET
Basophils Absolute: 0.1 10*3/uL (ref 0.0–0.1)
Basophils Relative: 1 % (ref 0–1)
EOS ABS: 0.3 10*3/uL (ref 0.0–0.7)
EOS PCT: 3 % (ref 0–5)
HEMATOCRIT: 21.9 % — AB (ref 39.0–52.0)
Hemoglobin: 7.4 g/dL — ABNORMAL LOW (ref 13.0–17.0)
LYMPHS PCT: 14 % (ref 12–46)
Lymphs Abs: 1.6 10*3/uL (ref 0.7–4.0)
MCH: 32.3 pg (ref 26.0–34.0)
MCHC: 33.8 g/dL (ref 30.0–36.0)
MCV: 95.6 fL (ref 78.0–100.0)
MONOS PCT: 15 % — AB (ref 3–12)
Monocytes Absolute: 1.6 10*3/uL — ABNORMAL HIGH (ref 0.1–1.0)
NEUTROS PCT: 67 % (ref 43–77)
Neutro Abs: 7.3 10*3/uL (ref 1.7–7.7)
PLATELETS: 570 10*3/uL — AB (ref 150–400)
RBC: 2.29 MIL/uL — AB (ref 4.22–5.81)
RDW: 20.7 % — ABNORMAL HIGH (ref 11.5–15.5)
WBC: 10.8 10*3/uL — AB (ref 4.0–10.5)

## 2014-02-18 LAB — BASIC METABOLIC PANEL
Anion gap: 6 (ref 5–15)
BUN: 11 mg/dL (ref 6–23)
CHLORIDE: 108 meq/L (ref 96–112)
CO2: 23 mmol/L (ref 19–32)
Calcium: 8.6 mg/dL (ref 8.4–10.5)
Creatinine, Ser: 0.48 mg/dL — ABNORMAL LOW (ref 0.50–1.35)
GFR calc Af Amer: >90 mL/min (ref >90)
GFR calc non Af Amer: >90 mL/min (ref >90)
GLUCOSE: 100 mg/dL — AB (ref 70–99)
POTASSIUM: 3.6 mmol/L (ref 3.5–5.1)
SODIUM: 137 mmol/L (ref 135–145)

## 2014-02-18 LAB — TYPE AND SCREEN
ABO/RH(D): O POS
Antibody Screen: NEGATIVE

## 2014-02-18 LAB — LACTATE DEHYDROGENASE: LDH: 542 U/L — AB (ref 94–250)

## 2014-02-18 MED ORDER — HYDROMORPHONE HCL 4 MG PO TABS
4.0000 mg | ORAL_TABLET | Freq: Once | ORAL | Status: AC
Start: 2014-02-18 — End: 2014-02-18
  Administered 2014-02-18: 4 mg via ORAL
  Filled 2014-02-18: qty 1

## 2014-02-18 MED ORDER — SODIUM CHLORIDE 0.45 % IV SOLN
INTRAVENOUS | Status: DC
  Administered 2014-02-18: 09:00:00 via INTRAVENOUS

## 2014-02-18 MED ORDER — HYDROMORPHONE HCL 2 MG/ML IJ SOLN
2.0000 mg | Freq: Once | INTRAMUSCULAR | Status: AC
Start: 2014-02-18 — End: 2014-02-18
  Administered 2014-02-18: 2 mg via INTRAVENOUS
  Filled 2014-02-18: qty 1

## 2014-02-18 MED ORDER — HYDROMORPHONE HCL 2 MG/ML IJ SOLN
3.0000 mg | Freq: Once | INTRAMUSCULAR | Status: AC
  Administered 2014-02-18: 3 mg via INTRAVENOUS
  Filled 2014-02-18: qty 2

## 2014-02-18 MED ORDER — SODIUM CHLORIDE 0.9 % IV SOLN
Freq: Once | INTRAVENOUS | Status: AC
  Administered 2014-02-18: 15:00:00 via INTRAVENOUS

## 2014-02-18 MED ORDER — SODIUM CHLORIDE 0.9 % IJ SOLN: 9.0000 mL | INTRAMUSCULAR | Status: DC | PRN

## 2014-02-18 MED ORDER — KETOROLAC TROMETHAMINE 30 MG/ML IJ SOLN
30.0000 mg | Freq: Four times a day (QID) | INTRAMUSCULAR | Status: DC
  Administered 2014-02-18: 30 mg via INTRAVENOUS
  Filled 2014-02-18: qty 1

## 2014-02-18 MED ORDER — ONDANSETRON HCL 4 MG/2ML IJ SOLN: 4.0000 mg | Freq: Four times a day (QID) | INTRAMUSCULAR | Status: DC | PRN

## 2014-02-18 MED ORDER — DIPHENHYDRAMINE HCL 25 MG PO CAPS
25.0000 mg | ORAL_CAPSULE | Freq: Four times a day (QID) | ORAL | Status: DC | PRN
  Administered 2014-02-18: 50 mg via ORAL
  Filled 2014-02-18: qty 2

## 2014-02-18 MED ORDER — HYDROMORPHONE HCL 2 MG/ML IJ SOLN
2.5000 mg | Freq: Once | INTRAMUSCULAR | Status: AC
  Administered 2014-02-18: 2.5 mg via INTRAVENOUS
  Filled 2014-02-18: qty 2

## 2014-02-18 MED ORDER — HYDROMORPHONE 2 MG/ML HIGH CONCENTRATION IV PCA SOLN
INTRAVENOUS | Status: DC
  Administered 2014-02-18: 11:00:00 via INTRAVENOUS
  Administered 2014-02-18: 11.2 mg via INTRAVENOUS
  Filled 2014-02-18: qty 25

## 2014-02-18 MED ORDER — HEPARIN SOD (PORK) LOCK FLUSH 100 UNIT/ML IV SOLN
500.0000 [IU] | INTRAVENOUS | Status: AC | PRN
  Administered 2014-02-18: 500 [IU]
  Filled 2014-02-18: qty 5

## 2014-02-18 MED ORDER — SODIUM CHLORIDE 0.9 % IJ SOLN: 10.0000 mL | INTRAMUSCULAR | Status: DC | PRN

## 2014-02-18 MED ORDER — NALOXONE HCL 0.4 MG/ML IJ SOLN: 0.4000 mg | INTRAMUSCULAR | Status: DC | PRN

## 2014-02-18 NOTE — H&P (Signed)
[name] History and Physical  [name] UMP:[id number] DOB: 04-18-1980 DOA: 02/18/2014   PCP: Somalia Segler A., MD   Chief Complaint:  Pain in ribs x 3 days  HPI:  Pt with Hb SS well known to me who presents with pain typical of crisis which started 3 days ago when there was a change in weather (decrease temperature and snow) He attempted to manage with his oral medications and was successful for the last 2 days. However pain resumed last night with greater intensity and his oral medications were ineffective in controlling the pain. He rates his pain as 9/10 and not associated with any other symptoms. The pain is localized to b/l ribs.He denies any SOB, dizziness, pre-syncope. Fevers, cough chills N/V/D. He is admitted to the [facility] for management of early acute sickle cell crisis.     Review of Systems:  Constitutional: No weight loss, night sweats, Fevers, chills, fatigue.  HEENT: No headaches, dizziness, seizures, vision changes, difficulty swallowing,Tooth/dental problems,Sore throat, No sneezing, itching, ear ache, nasal congestion, post nasal drip,  Cardio-vascular: No chest pain, Orthopnea, PND, swelling in lower extremities, anasarca, dizziness, palpitations  GI: No heartburn, indigestion, abdominal pain, nausea, vomiting, diarrhea, change in bowel habits, loss of appetite  Resp: No shortness of breath with exertion or at rest. No excess mucus, no productive cough, No non-productive cough, No coughing up of blood.No change in color of mucus.No wheezing.No chest wall deformity  Skin: no rash or lesions.  GU: no dysuria, change in color of urine, no urgency or frequency. No flank pain.  Psych: No change in mood or affect. No depression or anxiety. No memory loss.    Past Medical History  Diagnosis Date  . Sickle cell anemia   . Blood transfusion   . Acute embolism and thrombosis of right internal jugular vein   . Hypokalemia   . Mood disorder    . History of pulmonary embolus (PE)   . Avascular necrosis   . Leukocytosis     Chronic  . Thrombocytosis     Chronic  . Hypertension   . History of Clostridium difficile infection   . Uses marijuana   . Chronic anticoagulation   . Functional asplenia   . Former smoker   . Second hand tobacco smoke exposure   . Alcohol consumption of one to four drinks per day   . Noncompliance with medication regimen   . Sickle-cell crisis with associated acute chest syndrome 05/13/2013  . Acute chest syndrome 06/18/2013  . Demand ischemia 01/02/2014   Past Surgical History  Procedure Laterality Date  . Right hip replacement      08/2006  . Cholecystectomy      01/2008  . Porta cath placement    . Porta cath removal    . Umbilical hernia repair      01/2008  . Excision of left periauricular cyst      10/2009  . Excision of right ear lobe cyst with primary closur      11/2007  . Portacath placement  01/05/2012    Procedure: INSERTION PORT-A-CATH;  Surgeon: Odis Hollingshead, MD;  Location: [facility];  Service: General;  Laterality: N/A;  ultrasound guiced port a cath insertion with fluoroscopy   Social History:  reports that he quit smoking about 3 years ago. He has never used smokeless tobacco. He reports that he drinks about 1.2 oz of alcohol per week. He reports that he uses illicit drugs (Marijuana) about twice  per week.  No Active Allergies  Family History  Problem Relation Age of Onset  . Sickle cell trait Mother   . Depression Mother   . Sickle cell trait Father   . Sickle cell trait Brother     Prior to Admission medications   Medication Sig Start Date End Date Taking? Authorizing Provider  Cholecalciferol (VITAMIN D) 2000 UNITS tablet Take 1 tablet (2,000 Units total) by mouth daily. 02/13/14   [name], MD  folic acid (FOLVITE) 1 MG tablet Take 1 tablet (1 mg total) by mouth every morning. 10/08/13   [name], MD  HYDROmorphone (DILAUDID) 4 MG tablet Take 1  tablet (4 mg total) by mouth every 4 (four) hours as needed for severe pain. 02/11/14   [name], MD  hydroxyurea (HYDREA) 500 MG capsule Take 3 capsules (1,500 mg total) by mouth daily. May take with food to minimize GI side effects. 10/30/13   [name], MD  lisinopril (PRINIVIL,ZESTRIL) 10 MG tablet Take 1 tablet (10 mg total) by mouth daily. 01/22/14   [name], MD  metoprolol tartrate (LOPRESSOR) 25 MG tablet Take 1 tablet (25 mg total) by mouth daily. 08/22/13   [name], MD  morphine (MS CONTIN) 30 MG 12 hr tablet Take 1 tablet (30 mg total) by mouth every 12 (twelve) hours. 01/22/14   [name], MD  potassium chloride SA (K-DUR,KLOR-CON) 20 MEQ tablet Take 1 tablet (20 mEq total) by mouth every morning. 09/25/13   [name], MD  rivaroxaban (XARELTO) 20 MG TABS tablet Take 1 tablet (20 mg total) by mouth every morning. 01/06/14   [name], MD  zolpidem (AMBIEN) 10 MG tablet Take 1 tablet (10 mg total) by mouth at bedtime as needed for sleep. 10/28/13   Dorena Dew, FNP   Physical Exam: There were no vitals filed for this visit. General: Alert, awake, oriented x3, in no acute distress.  HEENT: [place]/AT PEERL, EOMI, mild icterus Neck: Trachea midline,  no masses, no thyromegal,y no JVD, no carotid bruit OROPHARYNX:  Moist, No exudate/ erythema/lesions.  Heart: Regular rate and rhythm, without murmurs, rubs, gallops, PMI non-displaced, no heaves or thrills on palpation.  Lungs: Clear to auscultation, no wheezing or rhonchi noted. No increased vocal fremitus resonant to percussion  Abdomen: Soft, nontender, nondistended, positive bowel sounds, no masses no hepatosplenomegaly noted..  Neuro: No focal neurological deficits noted cranial nerves II through XII grossly intact.   Labs on Admission:   Basic Metabolic Panel: No results for input(s): NA, K, CL, CO2, GLUCOSE, BUN, CREATININE, CALCIUM, MG, PHOS in the last 168  hours. Liver Function Tests: No results for input(s): AST, ALT, ALKPHOS, BILITOT, PROT, ALBUMIN in the last 168 hours. CBC: No results for input(s): WBC, NEUTROABS, HGB, HCT, MCV, PLT in the last 168 hours. BNP: Invalid input(s): POCBNP   Radiological Exams on Admission: No results found.   Assessment/Plan: Active Problems:  Hb SS with Crisis: Pt will be treated with initially treated with weight based rapid re-dosing IVF and Toradol . If pain is above 7/10 then Pt will be transitioned to a weight based Dilaudid PCA . PT will also receive IVF .45 NS and Toradol 30 mg IV. Patient will be re-evaluated for pain in the context of function and relationship to baseline as care progresses.   Time spend: 30 minutes Code Status: Full Code Family Communication: N/A Disposition Plan:  [place]., MD  Pager  [phone]  If 7PM-7AM, please contact night-coverage [email or web address] Password [email or web address] 02/18/2014, 9:03 AM

## 2014-02-18 NOTE — Discharge Summary (Signed)
Physician Discharge Summary  [name] OIN:[id number] DOB: 03/18/1980 DOA: 02/18/2014  PCP: Zhaire Locker A., MD  Admit date: 02/18/2014 Discharge date: 02/18/2014  Discharge Diagnoses:  Active Problems:   Hb-SS disease with crisis   Discharge Condition:  Stable  Disposition: Home  Diet: Regular diet Wt Readings from Last 3 Encounters:  02/13/14 162 lb (73.483 kg)  01/29/14 170 lb (77.111 kg)  01/22/14 160 lb (72.576 kg)    History of present illness:  Pt with Hb SS well known to me who presents with pain typical of crisis which started 3 days ago when there was a change in weather (decrease temperature and snow) He attempted to manage with his oral medications and was successful for the last 2 days. However pain resumed last night with greater intensity and his oral medications were ineffective in controlling the pain. He rates his pain as 9/10 and not associated with any other symptoms. The pain is localized to b/l ribs.He denies any SOB, dizziness, pre-syncope. Fevers, cough chills N/V/D. He is admitted to the [facility] for management of early acute sickle cell crisis.  Hospital Course:  Pt was initially treated with weight based rapid re-dosing IVF and Toradol . Pain remained above 7/10, and he was transitioned to a weight based Dilaudid PCA . He also received IVF at 100 ml/hr. His laboratory studies showed a Hb of 7.4 which is about baseline for [name] and and LDH of 542. The LDH is higher than normal and I discussed this with patient. However he is hemodynamically stable and his oxygen saturations are at baseline. He is able to ambulate without difficulty and without any further hypoxemia. I have typed [name] for PRBC's and he will have a recheck of his Hb and LDH tomorrow. I will re-evaluate for need for transfusion.  Discharge Exam:  Filed Vitals:   02/18/14 1343  BP:   Pulse:   Temp:   Resp: 18   Filed Vitals:   02/18/14 1052 02/18/14 1150 02/18/14  1250 02/18/14 1343  BP:  149/69 127/69   Pulse:  94 104   Temp:  98.6 F (37 C) 98.7 F (37.1 C)   TempSrc:  Oral Oral   Resp: 18 20 18 18   Height:      SpO2: 94% 94% 94% 94%   General: Alert, awake, oriented x3, in no acute distress.  HEENT: /AT PEERL, EOMI, mild icterus at baseline Neck: Trachea midline,  no masses, no thyromegal,y no JVD, no carotid bruit OROPHARYNX:  Moist, No exudate/ erythema/lesions.  Heart: Regular rate and rhythm, without murmurs, rubs, gallops, PMI non-displaced, no heaves or thrills on palpation.  Lungs: Clear to auscultation, no wheezing or rhonchi noted. No increased vocal fremitus resonant to percussion  Abdomen: Soft, nontender, nondistended, positive bowel sounds, no masses no hepatosplenomegaly noted..  Neuro: No focal neurological deficits noted cranial nerves II through XII grossly intact.  Strength normal in bilateral upper and lower extremities. Musculoskeletal: No warm swelling or erythema around joints, no spinal tenderness noted. Psychiatric: Patient alert and oriented x3, good insight and cognition, good recent to remote recall. Lymph node survey: No cervical axillary or inguinal lymphadenopathy noted.   Discharge Instructions  Discharge Instructions    Activity as tolerated - No restrictions    Complete by:  As directed      Diet general    Complete by:  As directed             Medication List    TAKE  these medications        folic acid 1 MG tablet  Commonly known as:  FOLVITE  Take 1 tablet (1 mg total) by mouth every morning.     HYDROmorphone 4 MG tablet  Commonly known as:  DILAUDID  Take 1 tablet (4 mg total) by mouth every 4 (four) hours as needed for severe pain.     hydroxyurea 500 MG capsule  Commonly known as:  HYDREA  Take 3 capsules (1,500 mg total) by mouth daily. May take with food to minimize GI side effects.     lisinopril 10 MG tablet  Commonly known as:  PRINIVIL,ZESTRIL  Take 1 tablet (10 mg total) by  mouth daily.     metoprolol tartrate 25 MG tablet  Commonly known as:  LOPRESSOR  Take 1 tablet (25 mg total) by mouth daily.     morphine 30 MG 12 hr tablet  Commonly known as:  MS CONTIN  Take 1 tablet (30 mg total) by mouth every 12 (twelve) hours.     potassium chloride SA 20 MEQ tablet  Commonly known as:  K-DUR,KLOR-CON  Take 1 tablet (20 mEq total) by mouth every morning.     rivaroxaban 20 MG Tabs tablet  Commonly known as:  XARELTO  Take 1 tablet (20 mg total) by mouth every morning.     Vitamin D 2000 UNITS tablet  Take 1 tablet (2,000 Units total) by mouth daily.     zolpidem 10 MG tablet  Commonly known as:  AMBIEN  Take 1 tablet (10 mg total) by mouth at bedtime as needed for sleep.         Significant Diagnostic Studies: Dg Chest 2 View  02/03/2014   CLINICAL DATA:  Hypoxia, cough, sickle cell disease, history pulmonary embolism, smoking  EXAM: CHEST  2 VIEW  COMPARISON:  01/02/2014  FINDINGS: LEFT subclavian Port-A-Cath with tip projecting over cavoatrial junction.  Enlargement of cardiac silhouette with pulmonary vascular congestion.  Mediastinal contour stable.  Lungs clear.  No pleural effusion or pneumothorax.  Bones unremarkable.  IMPRESSION: Enlargement of cardiac silhouette with pulmonary vascular congestion consistent with history of sickle cell disease.  No acute abnormalities.   Electronically Signed   By: Lavonia Dana M.D.   On: 02/03/2014 20:11     Labs: Basic Metabolic Panel:  Recent Labs Lab 02/18/14 0902  NA 137  K 3.6  CL 108  CO2 23  GLUCOSE 100*  BUN 11  CREATININE 0.48*  CALCIUM 8.6   Liver Function Tests: No results for input(s): AST, ALT, ALKPHOS, BILITOT, PROT, ALBUMIN in the last 168 hours. No results for input(s): LIPASE, AMYLASE in the last 168 hours. No results for input(s): AMMONIA in the last 168 hours. CBC:  Recent Labs Lab 02/18/14 0902  WBC 10.8*  NEUTROABS 7.3  HGB 7.4*  HCT 21.9*  MCV 95.6  PLT 570*    Cardiac Enzymes: No results for input(s): CKTOTAL, CKMB, CKMBINDEX, TROPONINI in the last 168 hours. BNP: Invalid input(s): POCBNP CBG: No results for input(s): GLUCAP in the last 168 hours. Ferritin:  Recent Labs Lab 02/13/14 1458  FERRITIN 6433*    Time coordinating discharge: 40 minutes  Signed:  Zykee Avakian A.  02/18/2014, 2:32 PM

## 2014-02-18 NOTE — Progress Notes (Signed)
Patient ID: [name], male   DOB: [birth year], 34 y.o.   MRN: [id number]  Pt discharged home. Pt a&ox4, pt in no distress. Blue band applied, pt instructed to keep blue band on and return on 02/19/14 for blood transfusion. Pt verbalized understanding. Pt reports pain tolerable at 5/10, pt denies any further needs at this time. Lt chest port flushed, packed with heparin, and deaccessed; dry dressing applied. Pt left ambulatory.

## 2014-02-19 ENCOUNTER — Non-Acute Institutional Stay (HOSPITAL_COMMUNITY)
Admission: AD | Admit: 2014-02-19 | Discharge: 2014-02-19 | Disposition: A | Discharge status: Home / Self Care | Payer: Medicare Other | Source: Ambulatory Visit | Attending: Internal Medicine | Admitting: Internal Medicine

## 2014-02-19 ENCOUNTER — Other Ambulatory Visit: Payer: Self-pay | Admitting: Internal Medicine

## 2014-02-19 ENCOUNTER — Other Ambulatory Visit: Payer: Self-pay

## 2014-02-19 DIAGNOSIS — D5700 Hb-SS disease with crisis, unspecified: Secondary | ICD-10-CM | POA: Insufficient documentation

## 2014-02-19 LAB — CBC WITH DIFFERENTIAL/PLATELET
Basophils Absolute: 0.1 10*3/uL (ref 0.0–0.1)
Basophils Relative: 1 % (ref 0–1)
EOS PCT: 3 % (ref 0–5)
Eosinophils Absolute: 0.4 10*3/uL (ref 0.0–0.7)
HCT: 25.3 % — ABNORMAL LOW (ref 39.0–52.0)
HEMOGLOBIN: 8.5 g/dL — AB (ref 13.0–17.0)
LYMPHS PCT: 13 % (ref 12–46)
Lymphs Abs: 1.9 10*3/uL (ref 0.7–4.0)
MCH: 32.2 pg (ref 26.0–34.0)
MCHC: 33.6 g/dL (ref 30.0–36.0)
MCV: 95.8 fL (ref 78.0–100.0)
MONO ABS: 1.1 10*3/uL — AB (ref 0.1–1.0)
MONOS PCT: 7 % (ref 3–12)
NEUTROS ABS: 11 10*3/uL — AB (ref 1.7–7.7)
Neutrophils Relative %: 76 % (ref 43–77)
Platelets: 629 10*3/uL — ABNORMAL HIGH (ref 150–400)
RBC: 2.64 MIL/uL — ABNORMAL LOW (ref 4.22–5.81)
RDW: 21 % — ABNORMAL HIGH (ref 11.5–15.5)
WBC: 14.5 10*3/uL — ABNORMAL HIGH (ref 4.0–10.5)

## 2014-02-19 LAB — LACTATE DEHYDROGENASE: LDH: 584 U/L — AB (ref 94–250)

## 2014-02-20 ENCOUNTER — Telehealth (HOSPITAL_COMMUNITY): Payer: Self-pay

## 2014-02-20 ENCOUNTER — Emergency Department (HOSPITAL_COMMUNITY)
Admission: EM | Admit: 2014-02-20 | Discharge: 2014-02-20 | Disposition: A | Discharge status: Home / Self Care | Payer: Medicare Other | Attending: Emergency Medicine | Admitting: Emergency Medicine

## 2014-02-20 ENCOUNTER — Encounter (HOSPITAL_COMMUNITY): Payer: Self-pay | Admitting: *Deleted

## 2014-02-20 ENCOUNTER — Ambulatory Visit (HOSPITAL_COMMUNITY): Payer: Medicare Other

## 2014-02-20 DIAGNOSIS — Q8901 Asplenia (congenital): Secondary | ICD-10-CM | POA: Diagnosis not present

## 2014-02-20 DIAGNOSIS — Z8639 Personal history of other endocrine, nutritional and metabolic disease: Secondary | ICD-10-CM | POA: Diagnosis not present

## 2014-02-20 DIAGNOSIS — Z9114 Patient's other noncompliance with medication regimen: Secondary | ICD-10-CM | POA: Insufficient documentation

## 2014-02-20 DIAGNOSIS — Z8739 Personal history of other diseases of the musculoskeletal system and connective tissue: Secondary | ICD-10-CM | POA: Diagnosis not present

## 2014-02-20 DIAGNOSIS — Z79899 Other long term (current) drug therapy: Secondary | ICD-10-CM | POA: Insufficient documentation

## 2014-02-20 DIAGNOSIS — Z7901 Long term (current) use of anticoagulants: Secondary | ICD-10-CM | POA: Insufficient documentation

## 2014-02-20 DIAGNOSIS — Z87891 Personal history of nicotine dependence: Secondary | ICD-10-CM | POA: Diagnosis not present

## 2014-02-20 DIAGNOSIS — Z86711 Personal history of pulmonary embolism: Secondary | ICD-10-CM | POA: Diagnosis not present

## 2014-02-20 DIAGNOSIS — Z8619 Personal history of other infectious and parasitic diseases: Secondary | ICD-10-CM | POA: Diagnosis not present

## 2014-02-20 DIAGNOSIS — I10 Essential (primary) hypertension: Secondary | ICD-10-CM | POA: Insufficient documentation

## 2014-02-20 DIAGNOSIS — Z86718 Personal history of other venous thrombosis and embolism: Secondary | ICD-10-CM | POA: Diagnosis not present

## 2014-02-20 DIAGNOSIS — D5700 Hb-SS disease with crisis, unspecified: Secondary | ICD-10-CM | POA: Diagnosis not present

## 2014-02-20 LAB — COMPREHENSIVE METABOLIC PANEL
ALT: 47 U/L (ref 0–53)
AST: 50 U/L — AB (ref 0–37)
Albumin: 4 g/dL (ref 3.5–5.2)
Alkaline Phosphatase: 91 U/L (ref 39–117)
Anion gap: 7 (ref 5–15)
BUN: 10 mg/dL (ref 6–23)
CALCIUM: 8.3 mg/dL — AB (ref 8.4–10.5)
CO2: 23 mmol/L (ref 19–32)
Chloride: 104 mEq/L (ref 96–112)
Creatinine, Ser: 0.54 mg/dL (ref 0.50–1.35)
GFR calc Af Amer: >90 mL/min (ref >90)
GFR calc non Af Amer: >90 mL/min (ref >90)
Glucose, Bld: 96 mg/dL (ref 70–99)
Potassium: 3.6 mmol/L (ref 3.5–5.1)
SODIUM: 134 mmol/L — AB (ref 135–145)
TOTAL PROTEIN: 7.2 g/dL (ref 6.0–8.3)
Total Bilirubin: 5.5 mg/dL — ABNORMAL HIGH (ref 0.3–1.2)

## 2014-02-20 LAB — CBC WITH DIFFERENTIAL/PLATELET
BASOS ABS: 0.1 10*3/uL (ref 0.0–0.1)
Basophils Relative: 1 % (ref 0–1)
Eosinophils Absolute: 0.4 10*3/uL (ref 0.0–0.7)
Eosinophils Relative: 3 % (ref 0–5)
HCT: 20.2 % — ABNORMAL LOW (ref 39.0–52.0)
Hemoglobin: 7.2 g/dL — ABNORMAL LOW (ref 13.0–17.0)
LYMPHS ABS: 3.4 10*3/uL (ref 0.7–4.0)
Lymphocytes Relative: 25 % (ref 12–46)
MCH: 34.1 pg — ABNORMAL HIGH (ref 26.0–34.0)
MCHC: 35.6 g/dL (ref 30.0–36.0)
MCV: 95.7 fL (ref 78.0–100.0)
Monocytes Absolute: 1.6 10*3/uL — ABNORMAL HIGH (ref 0.1–1.0)
Monocytes Relative: 12 % (ref 3–12)
NEUTROS ABS: 7.9 10*3/uL — AB (ref 1.7–7.7)
NEUTROS PCT: 59 % (ref 43–77)
PLATELETS: 580 10*3/uL — AB (ref 150–400)
RBC: 2.11 MIL/uL — ABNORMAL LOW (ref 4.22–5.81)
RDW: 20.8 % — AB (ref 11.5–15.5)
WBC: 13.4 10*3/uL — AB (ref 4.0–10.5)

## 2014-02-20 LAB — RETICULOCYTES
RBC.: 2.11 MIL/uL — ABNORMAL LOW (ref 4.22–5.81)
RETIC COUNT ABSOLUTE: 194.1 10*3/uL — AB (ref 19.0–186.0)
RETIC CT PCT: 9.2 % — AB (ref 0.4–3.1)

## 2014-02-20 MED ORDER — SODIUM CHLORIDE 0.9 % IV BOLUS (SEPSIS)
1000.0000 mL | Freq: Once | INTRAVENOUS | Status: AC
  Administered 2014-02-20: 1000 mL via INTRAVENOUS

## 2014-02-20 MED ORDER — HYDROMORPHONE HCL 2 MG/ML IJ SOLN
2.0000 mg | Freq: Once | INTRAMUSCULAR | Status: AC
Start: 2014-02-20 — End: 2014-02-20
  Administered 2014-02-20: 2 mg via INTRAVENOUS
  Filled 2014-02-20: qty 1

## 2014-02-20 MED ORDER — KETOROLAC TROMETHAMINE 30 MG/ML IJ SOLN
30.0000 mg | Freq: Once | INTRAMUSCULAR | Status: AC
  Administered 2014-02-20: 30 mg via INTRAVENOUS
  Filled 2014-02-20: qty 1

## 2014-02-20 MED ORDER — HYDROMORPHONE HCL 1 MG/ML IJ SOLN
1.0000 mg | Freq: Once | INTRAMUSCULAR | Status: AC
  Administered 2014-02-20: 1 mg via INTRAVENOUS
  Filled 2014-02-20: qty 1

## 2014-02-20 MED ORDER — DIPHENHYDRAMINE HCL 50 MG/ML IJ SOLN
25.0000 mg | Freq: Once | INTRAMUSCULAR | Status: AC
  Administered 2014-02-20: 25 mg via INTRAVENOUS
  Filled 2014-02-20: qty 1

## 2014-02-20 MED ORDER — POTASSIUM CHLORIDE CRYS ER 20 MEQ PO TBCR
20.0000 meq | EXTENDED_RELEASE_TABLET | Freq: Once | ORAL | Status: AC
  Administered 2014-02-20: 20 meq via ORAL
  Filled 2014-02-20: qty 1

## 2014-02-20 MED ORDER — HEPARIN SOD (PORK) LOCK FLUSH 100 UNIT/ML IV SOLN
500.0000 [IU] | Freq: Once | INTRAVENOUS | Status: AC
  Administered 2014-02-20: 500 [IU]
  Filled 2014-02-20: qty 5

## 2014-02-20 MED ORDER — ONDANSETRON HCL 4 MG/2ML IJ SOLN
4.0000 mg | Freq: Once | INTRAMUSCULAR | Status: AC
  Administered 2014-02-20: 4 mg via INTRAVENOUS
  Filled 2014-02-20: qty 2

## 2014-02-20 NOTE — ED Provider Notes (Signed)
CSN: [id number]     Arrival date & time 02/20/14  0107 History   First MD Initiated Contact with Patient 02/20/14 [phone]     Chief Complaint  Patient presents with  . Sickle Cell Pain Crisis     (Consider location/radiation/quality/duration/timing/severity/associated sxs/prior Treatment) HPI Comments: This is the third day in a row.  The patient has been seen by medical professionals for his sickle cell pain.  He states he is taking his medication without any relief.  He denies any fever, nausea, vomiting, shortness of breath, cough, URI symptoms, pain, he presents with today and is bilateral sides, upper abdomen, lower chest radiating to his back is typical of his sickle cell crisis pain  Patient is a 34 y.o. male presenting with sickle cell pain. The history is provided by the patient.  Sickle Cell Pain Crisis Location:  Chest, back, abdomen, L side and R side Severity:  Severe Onset quality:  Gradual Duration:  2 days Similar to previous crisis episodes: yes   Timing:  Constant Progression:  Unchanged Chronicity:  Chronic Usual hemoglobin level:  7.5 History of pulmonary emboli: no   Relieved by:  Nothing Worsened by:  Nothing tried Ineffective treatments:  Prescription drugs and hydroxyurea Associated symptoms: chest pain   Associated symptoms: no congestion, no cough, no fatigue, no fever, no headaches, no nausea, no priapism, no shortness of breath and no vomiting     Past Medical History  Diagnosis Date  . Sickle cell anemia   . Blood transfusion   . Acute embolism and thrombosis of right internal jugular vein   . Hypokalemia   . Mood disorder   . History of pulmonary embolus (PE)   . Avascular necrosis   . Leukocytosis     Chronic  . Thrombocytosis     Chronic  . Hypertension   . History of Clostridium difficile infection   . Uses marijuana   . Chronic anticoagulation   . Functional asplenia   . Former smoker   . Second hand tobacco smoke exposure   . Alcohol  consumption of one to four drinks per day   . Noncompliance with medication regimen   . Sickle-cell crisis with associated acute chest syndrome 05/13/2013  . Acute chest syndrome 06/18/2013  . Demand ischemia 01/02/2014   Past Surgical History  Procedure Laterality Date  . Right hip replacement      08/2006  . Cholecystectomy      01/2008  . Porta cath placement    . Porta cath removal    . Umbilical hernia repair      01/2008  . Excision of left periauricular cyst      10/2009  . Excision of right ear lobe cyst with primary closur      11/2007  . Portacath placement  01/05/2012    Procedure: INSERTION PORT-A-CATH;  Surgeon: Odis Hollingshead, MD;  Location: [place];  Service: General;  Laterality: N/A;  ultrasound guiced port a cath insertion with fluoroscopy   Family History  Problem Relation Age of Onset  . Sickle cell trait Mother   . Depression Mother   . Diabetes Mother   . Sickle cell trait Father   . Sickle cell trait Brother    History  Substance Use Topics  . Smoking status: Former Smoker -- 13 years    Quit date: 07/08/2010  . Smokeless tobacco: Never Used  . Alcohol Use: 1.2 oz/week    2 Shots of liquor per week  Review of Systems  Constitutional: Negative for fever and fatigue.  HENT: Negative for congestion.   Respiratory: Negative for cough and shortness of breath.   Cardiovascular: Positive for chest pain. Negative for palpitations and leg swelling.  Gastrointestinal: Positive for abdominal pain. Negative for nausea, vomiting, diarrhea, constipation and abdominal distention.  Genitourinary: Negative for dysuria and flank pain.  Skin: Negative for rash.  Neurological: Negative for headaches.  All other systems reviewed and are negative.     Allergies  Review of patient's allergies indicates no known allergies.  Home Medications   Prior to Admission medications   Medication Sig Start Date End Date Taking? Authorizing Provider  Cholecalciferol  (VITAMIN D) 2000 UNITS tablet Take 1 tablet (2,000 Units total) by mouth daily. 02/13/14  Yes [name], MD  folic acid (FOLVITE) 1 MG tablet Take 1 tablet (1 mg total) by mouth every morning. 10/08/13  Yes [name], MD  HYDROmorphone (DILAUDID) 4 MG tablet Take 1 tablet (4 mg total) by mouth every 4 (four) hours as needed for severe pain. 02/11/14  Yes [name], MD  hydroxyurea (HYDREA) 500 MG capsule Take 3 capsules (1,500 mg total) by mouth daily. May take with food to minimize GI side effects. 10/30/13  Yes [name], MD  lisinopril (PRINIVIL,ZESTRIL) 10 MG tablet Take 1 tablet (10 mg total) by mouth daily. 01/22/14  Yes [name], MD  metoprolol tartrate (LOPRESSOR) 25 MG tablet Take 1 tablet (25 mg total) by mouth daily. 08/22/13  Yes [name], MD  morphine (MS CONTIN) 30 MG 12 hr tablet Take 1 tablet (30 mg total) by mouth every 12 (twelve) hours. 01/22/14  Yes [name], MD  potassium chloride SA (K-DUR,KLOR-CON) 20 MEQ tablet Take 1 tablet (20 mEq total) by mouth every morning. 09/25/13  Yes [name], MD  rivaroxaban (XARELTO) 20 MG TABS tablet Take 1 tablet (20 mg total) by mouth every morning. 01/06/14  Yes [name], MD  zolpidem (AMBIEN) 10 MG tablet Take 1 tablet (10 mg total) by mouth at bedtime as needed for sleep. 10/28/13  Yes Dorena Dew, FNP   BP 114/69 mmHg  Pulse 68  Temp(Src) 98.3 F (36.8 C) (Oral)  Resp 19  SpO2 98% Physical Exam  Constitutional: He appears well-developed and well-nourished.  HENT:  Head: Normocephalic.  Eyes: Pupils are equal, round, and reactive to light.  Neck: Normal range of motion.  Cardiovascular: Normal rate and regular rhythm.   Pulmonary/Chest: Effort normal.  Abdominal: Soft. He exhibits no distension. There is no tenderness.    Nursing note and vitals reviewed.   ED Course  Procedures (including critical care time) Labs Review Labs Reviewed    CBC WITH DIFFERENTIAL - Abnormal; Notable for the following:    WBC 13.4 (*)    RBC 2.11 (*)    Hemoglobin 7.2 (*)    HCT 20.2 (*)    MCH 34.1 (*)    RDW 20.8 (*)    Platelets 580 (*)    Neutro Abs 7.9 (*)    Monocytes Absolute 1.6 (*)    All other components within normal limits  COMPREHENSIVE METABOLIC PANEL - Abnormal; Notable for the following:    Sodium 128 (*)    Potassium 3.3 (*)    Calcium 7.9 (*)    AST 50 (*)    Total Bilirubin 5.5 (*)    All other components within normal limits  RETICULOCYTES - Abnormal; Notable for  the following:    Retic Ct Pct 9.2 (*)    RBC. 2.11 (*)    Retic Count, Manual 194.1 (*)    All other components within normal limits    Imaging Review No results found.   EKG Interpretation None     Patient is now 3 out of [place], NP 02/20/14 [place], MD 02/20/14 [phone]

## 2014-02-20 NOTE — Telephone Encounter (Signed)
PT Placement RN updated on corrected results.

## 2014-02-20 NOTE — Discharge Instructions (Signed)
Sickle Cell Anemia, Adult °Sickle cell anemia is a condition in which red blood cells have an abnormal "sickle" shape. This abnormal shape shortens the cells' life span, which results in a lower than normal concentration of red blood cells in the blood. The sickle shape also causes the cells to clump together and block free blood flow through the blood vessels. As a result, the tissues and organs of the body do not receive enough oxygen. Sickle cell anemia causes organ damage and pain and increases the risk of infection. °CAUSES  °Sickle cell anemia is a genetic disorder. Those who receive two copies of the gene have the condition, and those who receive one copy have the trait. °RISK FACTORS °The sickle cell gene is most common in people whose families originated in [place]. Other areas of the globe where sickle cell trait occurs include the Mediterranean, [place] and [place], the [place], and the [place].  °SIGNS AND SYMPTOMS °· Pain, especially in the extremities, back, chest, or abdomen (common). The pain may start suddenly or may develop following an illness, especially if there is dehydration. Pain can also occur due to overexertion or exposure to extreme temperature changes. °· Frequent severe bacterial infections, especially certain types of pneumonia and meningitis. °· Pain and swelling in the hands and feet. °· Decreased activity.   °· Loss of appetite.   °· Change in behavior. °· Headaches. °· Seizures. °· Shortness of breath or difficulty breathing. °· Vision changes. °· Skin ulcers. °Those with the trait may not have symptoms or they may have mild symptoms.  °DIAGNOSIS  °Sickle cell anemia is diagnosed with blood tests that demonstrate the genetic trait. It is often diagnosed during the newborn period, due to mandatory testing nationwide. A variety of blood tests, X-rays, CT scans, MRI scans, ultrasounds, and lung function tests may also be done to monitor the condition. °TREATMENT  °Sickle  cell anemia may be treated with: °· Medicines. You may be given pain medicines, antibiotic medicines (to treat and prevent infections) or medicines to increase the production of certain types of hemoglobin. °· Fluids. °· Oxygen. °· Blood transfusions. °HOME CARE INSTRUCTIONS  °· Drink enough fluid to keep your urine clear or pale yellow. Increase your fluid intake in hot weather and during exercise. °· Do not smoke. Smoking lowers oxygen levels in the blood.   °· Only take over-the-counter or prescription medicines for pain, fever, or discomfort as directed by your health care provider. °· Take antibiotics as directed by your health care provider. Make sure you finish them it even if you start to feel better.   °· Take supplements as directed by your health care provider.   °· Consider wearing a medical alert bracelet. This tells anyone caring for you in an emergency of your condition.   °· When traveling, keep your medical information, health care provider's names, and the medicines you take with you at all times.   °· If you develop a fever, do not take medicines to reduce the fever right away. This could cover up a problem that is developing. Notify your health care provider. °· Keep all follow-up appointments with your health care provider. Sickle cell anemia requires regular medical care. °SEEK MEDICAL CARE IF: ° You have a fever. °SEEK IMMEDIATE MEDICAL CARE IF:  °· You feel dizzy or faint.   °· You have new abdominal pain, especially on the left side near the stomach area.   °· You develop a persistent, often uncomfortable and painful penile erection (priapism). If this is not treated immediately it   will lead to impotence.   You have numbness your arms or legs or you have a hard time moving them.   You have a hard time with speech.   You have a fever or persistent symptoms for more than 2-3 days.   You have a fever and your symptoms suddenly get worse.   You have signs or symptoms of infection.  These include:   Chills.   Abnormal tiredness (lethargy).   Irritability.   Poor eating.   Vomiting.   You develop pain that is not helped with medicine.   You develop shortness of breath.  You have pain in your chest.   You are coughing up pus-like or bloody sputum.   You develop a stiff neck.  Your feet or hands swell or have pain.  Your abdomen appears bloated.  You develop joint pain. MAKE SURE YOU:  Understand these instructions. Document Released: 04/27/2005 Document Revised: 06/03/2013 Document Reviewed: 08/29/2012 [facility] Patient Information 2015 [place], [place]. This information is not intended to replace advice given to you by your health care provider. Make sure you discuss any questions you have with your health care provider. Please try to stay hydrated, take your home medication as directed.  Follow-up with Dr. Zigmund Daniel

## 2014-02-20 NOTE — ED Notes (Signed)
PT states that he has been having lower back pain since Sun; pt states that was seen at the [facility] today; pt states that he is still having pain

## 2014-02-20 NOTE — ED Notes (Signed)
Bed: RS85 Expected date:  Expected time:  Means of arrival:  Comments: [name], [name]

## 2014-02-23 ENCOUNTER — Emergency Department (HOSPITAL_COMMUNITY)
Admission: EM | Admit: 2014-02-23 | Discharge: 2014-02-23 | Disposition: A | Discharge status: Home / Self Care | Payer: Medicare Other | Attending: Emergency Medicine | Admitting: Emergency Medicine

## 2014-02-23 ENCOUNTER — Encounter (HOSPITAL_COMMUNITY): Payer: Self-pay | Admitting: Emergency Medicine

## 2014-02-23 ENCOUNTER — Emergency Department (HOSPITAL_COMMUNITY): Payer: Medicare Other

## 2014-02-23 DIAGNOSIS — Z79899 Other long term (current) drug therapy: Secondary | ICD-10-CM | POA: Diagnosis not present

## 2014-02-23 DIAGNOSIS — Z86711 Personal history of pulmonary embolism: Secondary | ICD-10-CM | POA: Diagnosis not present

## 2014-02-23 DIAGNOSIS — Z87891 Personal history of nicotine dependence: Secondary | ICD-10-CM | POA: Insufficient documentation

## 2014-02-23 DIAGNOSIS — Z8619 Personal history of other infectious and parasitic diseases: Secondary | ICD-10-CM | POA: Insufficient documentation

## 2014-02-23 DIAGNOSIS — Z86718 Personal history of other venous thrombosis and embolism: Secondary | ICD-10-CM | POA: Diagnosis not present

## 2014-02-23 DIAGNOSIS — Z8739 Personal history of other diseases of the musculoskeletal system and connective tissue: Secondary | ICD-10-CM | POA: Insufficient documentation

## 2014-02-23 DIAGNOSIS — Z8659 Personal history of other mental and behavioral disorders: Secondary | ICD-10-CM | POA: Insufficient documentation

## 2014-02-23 DIAGNOSIS — Z7901 Long term (current) use of anticoagulants: Secondary | ICD-10-CM | POA: Insufficient documentation

## 2014-02-23 DIAGNOSIS — Z9114 Patient's other noncompliance with medication regimen: Secondary | ICD-10-CM | POA: Insufficient documentation

## 2014-02-23 DIAGNOSIS — I10 Essential (primary) hypertension: Secondary | ICD-10-CM | POA: Diagnosis not present

## 2014-02-23 DIAGNOSIS — R05 Cough: Secondary | ICD-10-CM | POA: Diagnosis not present

## 2014-02-23 DIAGNOSIS — D57819 Other sickle-cell disorders with crisis, unspecified: Secondary | ICD-10-CM | POA: Diagnosis not present

## 2014-02-23 DIAGNOSIS — D5700 Hb-SS disease with crisis, unspecified: Secondary | ICD-10-CM

## 2014-02-23 DIAGNOSIS — Z8639 Personal history of other endocrine, nutritional and metabolic disease: Secondary | ICD-10-CM | POA: Diagnosis not present

## 2014-02-23 DIAGNOSIS — Q8901 Asplenia (congenital): Secondary | ICD-10-CM | POA: Diagnosis not present

## 2014-02-23 DIAGNOSIS — R0989 Other specified symptoms and signs involving the circulatory and respiratory systems: Secondary | ICD-10-CM | POA: Diagnosis not present

## 2014-02-23 LAB — CBC WITH DIFFERENTIAL/PLATELET
BASOS ABS: 0.1 10*3/uL (ref 0.0–0.1)
BASOS PCT: 1 % (ref 0–1)
EOS PCT: 2 % (ref 0–5)
Eosinophils Absolute: 0.3 10*3/uL (ref 0.0–0.7)
HCT: 24.8 % — ABNORMAL LOW (ref 39.0–52.0)
HEMOGLOBIN: 8.3 g/dL — AB (ref 13.0–17.0)
Lymphocytes Relative: 16 % (ref 12–46)
Lymphs Abs: 2.3 10*3/uL (ref 0.7–4.0)
MCH: 33.1 pg (ref 26.0–34.0)
MCHC: 33.5 g/dL (ref 30.0–36.0)
MCV: 98.8 fL (ref 78.0–100.0)
MONOS PCT: 12 % (ref 3–12)
Monocytes Absolute: 1.7 10*3/uL — ABNORMAL HIGH (ref 0.1–1.0)
NEUTROS PCT: 69 % (ref 43–77)
Neutro Abs: 9.7 10*3/uL — ABNORMAL HIGH (ref 1.7–7.7)
PLATELETS: 665 10*3/uL — AB (ref 150–400)
RBC: 2.51 MIL/uL — ABNORMAL LOW (ref 4.22–5.81)
RDW: 22.8 % — ABNORMAL HIGH (ref 11.5–15.5)
WBC: 14.1 10*3/uL — ABNORMAL HIGH (ref 4.0–10.5)

## 2014-02-23 LAB — BASIC METABOLIC PANEL
ANION GAP: 7 (ref 5–15)
BUN: 7 mg/dL (ref 6–23)
CALCIUM: 8.9 mg/dL (ref 8.4–10.5)
CHLORIDE: 107 mmol/L (ref 96–112)
CO2: 22 mmol/L (ref 19–32)
Creatinine, Ser: 0.52 mg/dL (ref 0.50–1.35)
GFR calc non Af Amer: >90 mL/min (ref >90)
Glucose, Bld: 98 mg/dL (ref 70–99)
POTASSIUM: 3.7 mmol/L (ref 3.5–5.1)
Sodium: 136 mmol/L (ref 135–145)

## 2014-02-23 LAB — LACTATE DEHYDROGENASE: LDH: 556 U/L — ABNORMAL HIGH (ref 94–250)

## 2014-02-23 MED ORDER — KETOROLAC TROMETHAMINE 30 MG/ML IJ SOLN
30.0000 mg | Freq: Once | INTRAMUSCULAR | Status: AC
Start: 2014-02-23 — End: 2014-02-23
  Administered 2014-02-23: 30 mg via INTRAVENOUS
  Filled 2014-02-23: qty 1

## 2014-02-23 MED ORDER — HYDROMORPHONE HCL 2 MG/ML IJ SOLN
1.5000 mg | INTRAMUSCULAR | Status: DC | PRN
  Administered 2014-02-23 (×3): 1.5 mg via INTRAVENOUS
  Filled 2014-02-23 (×3): qty 1

## 2014-02-23 MED ORDER — HEPARIN SOD (PORK) LOCK FLUSH 100 UNIT/ML IV SOLN
500.0000 [IU] | Freq: Once | INTRAVENOUS | Status: AC
  Administered 2014-02-23: 500 [IU]
  Filled 2014-02-23: qty 5

## 2014-02-23 MED ORDER — SODIUM CHLORIDE 0.9 % IV SOLN
INTRAVENOUS | Status: DC
  Administered 2014-02-23: 09:00:00 via INTRAVENOUS

## 2014-02-23 MED ORDER — DIPHENHYDRAMINE HCL 50 MG/ML IJ SOLN
25.0000 mg | Freq: Once | INTRAMUSCULAR | Status: AC
  Administered 2014-02-23: 25 mg via INTRAVENOUS
  Filled 2014-02-23: qty 1

## 2014-02-23 MED ORDER — ONDANSETRON HCL 4 MG/2ML IJ SOLN
4.0000 mg | Freq: Once | INTRAMUSCULAR | Status: AC
  Administered 2014-02-23: 4 mg via INTRAVENOUS
  Filled 2014-02-23: qty 2

## 2014-02-23 NOTE — Discharge Instructions (Signed)
Sickle Cell Anemia, Adult °Sickle cell anemia is a condition in which red blood cells have an abnormal "sickle" shape. This abnormal shape shortens the cells' life span, which results in a lower than normal concentration of red blood cells in the blood. The sickle shape also causes the cells to clump together and block free blood flow through the blood vessels. As a result, the tissues and organs of the body do not receive enough oxygen. Sickle cell anemia causes organ damage and pain and increases the risk of infection. °CAUSES  °Sickle cell anemia is a genetic disorder. Those who receive two copies of the gene have the condition, and those who receive one copy have the trait. °RISK FACTORS °The sickle cell gene is most common in people whose families originated in [place]. Other areas of the globe where sickle cell trait occurs include the [place], [place] and [place], the [place], and the [place].  °SIGNS AND SYMPTOMS °· Pain, especially in the extremities, back, chest, or abdomen (common). The pain may start suddenly or may develop following an illness, especially if there is dehydration. Pain can also occur due to overexertion or exposure to extreme temperature changes. °· Frequent severe bacterial infections, especially certain types of pneumonia and meningitis. °· Pain and swelling in the hands and feet. °· Decreased activity.   °· Loss of appetite.   °· Change in behavior. °· Headaches. °· Seizures. °· Shortness of breath or difficulty breathing. °· Vision changes. °· Skin ulcers. °Those with the trait may not have symptoms or they may have mild symptoms.  °DIAGNOSIS  °Sickle cell anemia is diagnosed with blood tests that demonstrate the genetic trait. It is often diagnosed during the newborn period, due to mandatory testing nationwide. A variety of blood tests, X-rays, CT scans, MRI scans, ultrasounds, and lung function tests may also be done to monitor the condition. °TREATMENT  °Sickle  cell anemia may be treated with: °· Medicines. You may be given pain medicines, antibiotic medicines (to treat and prevent infections) or medicines to increase the production of certain types of hemoglobin. °· Fluids. °· Oxygen. °· Blood transfusions. °HOME CARE INSTRUCTIONS  °· Drink enough fluid to keep your urine clear or pale yellow. Increase your fluid intake in hot weather and during exercise. °· Do not smoke. Smoking lowers oxygen levels in the blood.   °· Only take over-the-counter or prescription medicines for pain, fever, or discomfort as directed by your health care provider. °· Take antibiotics as directed by your health care provider. Make sure you finish them it even if you start to feel better.   °· Take supplements as directed by your health care provider.   °· Consider wearing a medical alert bracelet. This tells anyone caring for you in an emergency of your condition.   °· When traveling, keep your medical information, health care provider's names, and the medicines you take with you at all times.   °· If you develop a fever, do not take medicines to reduce the fever right away. This could cover up a problem that is developing. Notify your health care provider. °· Keep all follow-up appointments with your health care provider. Sickle cell anemia requires regular medical care. °SEEK MEDICAL CARE IF: ° You have a fever. °SEEK IMMEDIATE MEDICAL CARE IF:  °· You feel dizzy or faint.   °· You have new abdominal pain, especially on the left side near the stomach area.   °· You develop a persistent, often uncomfortable and painful penile erection (priapism). If this is not treated immediately it   will lead to impotence.   °· You have numbness your arms or legs or you have a hard time moving them.   °· You have a hard time with speech.   °· You have a fever or persistent symptoms for more than 2-3 days.   °· You have a fever and your symptoms suddenly get worse.   °· You have signs or symptoms of infection.  These include:   °¨ Chills.   °¨ Abnormal tiredness (lethargy).   °¨ Irritability.   °¨ Poor eating.   °¨ Vomiting.   °· You develop pain that is not helped with medicine.   °· You develop shortness of breath. °· You have pain in your chest.   °· You are coughing up pus-like or bloody sputum.   °· You develop a stiff neck. °· Your feet or hands swell or have pain. °· Your abdomen appears bloated. °· You develop joint pain. °MAKE SURE YOU: °· Understand these instructions. °Document Released: 04/27/2005 Document Revised: 06/03/2013 Document Reviewed: 08/29/2012 °[employer]® Patient Information ©2015 [employer]. This information is not intended to replace advice given to you by your health care provider. Make sure you discuss any questions you have with your health care provider. ° °

## 2014-02-23 NOTE — ED Notes (Signed)
Pt from home c/o left sided body pain starting at 4 am. HX of sickle cell. He reports taking dilaudid at 0600.

## 2014-02-23 NOTE — ED Provider Notes (Signed)
CSN: [id number]     Arrival date & time 02/23/14  7829 History   First MD Initiated Contact with Patient 02/23/14 [phone]     Chief Complaint  Patient presents with  . Sickle Cell Pain Crisis     (Consider location/radiation/quality/duration/timing/severity/associated sxs/prior Treatment) Patient is a 34 y.o. male presenting with sickle cell pain. The history is provided by the patient.  Sickle Cell Pain Crisis  [name] is a 34 y.o. male who presents for evaluation of left chest pain which is worse when he takes deep breath.  He states that the pain feels "like a blood clot".  He continues to takes her xarelto, for pulmonary embolus.  He denies fever, chills, nausea, vomiting, weakness or dizziness.  [facility], as well as having numerous visits to the emergency department, recently.  His hemoglobin has been stable and he has not required a transfusion, recently.  There are no other known modifying factors.    Past Medical History  Diagnosis Date  . Sickle cell anemia   . Blood transfusion   . Acute embolism and thrombosis of right internal jugular vein   . Hypokalemia   . Mood disorder   . History of pulmonary embolus (PE)   . Avascular necrosis   . Leukocytosis     Chronic  . Thrombocytosis     Chronic  . Hypertension   . History of Clostridium difficile infection   . Uses marijuana   . Chronic anticoagulation   . Functional asplenia   . Former smoker   . Second hand tobacco smoke exposure   . Alcohol consumption of one to four drinks per day   . Noncompliance with medication regimen   . Sickle-cell crisis with associated acute chest syndrome 05/13/2013  . Acute chest syndrome 06/18/2013  . Demand ischemia 01/02/2014   Past Surgical History  Procedure Laterality Date  . Right hip replacement      08/2006  . Cholecystectomy      01/2008  . Porta cath placement    . Porta cath removal    . Umbilical hernia repair     01/2008  . Excision of left periauricular cyst      10/2009  . Excision of right ear lobe cyst with primary closur      11/2007  . Portacath placement  01/05/2012    Procedure: INSERTION PORT-A-CATH;  Surgeon: Odis Hollingshead, MD;  Location: [place];  Service: General;  Laterality: N/A;  ultrasound guiced port a cath insertion with fluoroscopy   Family History  Problem Relation Age of Onset  . Sickle cell trait Mother   . Depression Mother   . Diabetes Mother   . Sickle cell trait Father   . Sickle cell trait Brother    History  Substance Use Topics  . Smoking status: Former Smoker -- 13 years    Quit date: 07/08/2010  . Smokeless tobacco: Never Used  . Alcohol Use: 1.2 oz/week    2 Shots of liquor per week    Review of Systems  All other systems reviewed and are negative.     Allergies  Review of patient's allergies indicates no known allergies.  Home Medications   Prior to Admission medications   Medication Sig Start Date End Date Taking? Authorizing Provider  Cholecalciferol (VITAMIN D) 2000 UNITS tablet Take 1 tablet (2,000 Units total) by mouth daily. 02/13/14  Yes [name], MD  folic acid (FOLVITE) 1  MG tablet Take 1 tablet (1 mg total) by mouth every morning. 10/08/13  Yes [name], MD  HYDROmorphone (DILAUDID) 4 MG tablet Take 1 tablet (4 mg total) by mouth every 4 (four) hours as needed for severe pain. 02/11/14  Yes [name], MD  hydroxyurea (HYDREA) 500 MG capsule Take 3 capsules (1,500 mg total) by mouth daily. May take with food to minimize GI side effects. 10/30/13  Yes [name], MD  lisinopril (PRINIVIL,ZESTRIL) 10 MG tablet Take 1 tablet (10 mg total) by mouth daily. 01/22/14  Yes [name], MD  metoprolol tartrate (LOPRESSOR) 25 MG tablet Take 1 tablet (25 mg total) by mouth daily. 08/22/13  Yes [name], MD  morphine (MS CONTIN) 30 MG 12 hr tablet Take 1 tablet (30 mg total) by mouth every 12  (twelve) hours. 01/22/14  Yes [name], MD  potassium chloride SA (K-DUR,KLOR-CON) 20 MEQ tablet Take 1 tablet (20 mEq total) by mouth every morning. 09/25/13  Yes [name], MD  rivaroxaban (XARELTO) 20 MG TABS tablet Take 1 tablet (20 mg total) by mouth every morning. 01/06/14  Yes [name], MD  zolpidem (AMBIEN) 10 MG tablet Take 1 tablet (10 mg total) by mouth at bedtime as needed for sleep. 10/28/13  Yes Dorena Dew, FNP   BP 142/83 mmHg  Pulse 87  Temp(Src) 97.9 F (36.6 C) (Oral)  Resp 20  SpO2 94% Physical Exam  Constitutional: He is oriented to person, place, and time. He appears well-developed and well-nourished. He appears distressed (moderate facial grimacing noted when he discusses his chest discomfort).  HENT:  Head: Normocephalic and atraumatic.  Right Ear: External ear normal.  Left Ear: External ear normal.  Eyes: Conjunctivae and EOM are normal. Pupils are equal, round, and reactive to light.  Neck: Normal range of motion and phonation normal. Neck supple.  Cardiovascular: Normal rate, regular rhythm and normal heart sounds.   Port-A-Cath site right upper chest wall, appears normal.  Pulmonary/Chest: Effort normal and breath sounds normal. No respiratory distress. He has no wheezes. He exhibits no tenderness and no bony tenderness.  Abdominal: Soft. There is no tenderness.  Musculoskeletal: Normal range of motion.  Neurological: He is alert and oriented to person, place, and time. No cranial nerve deficit or sensory deficit. He exhibits normal muscle tone. Coordination normal.  Skin: Skin is warm, dry and intact.  Psychiatric: He has a normal mood and affect. His behavior is normal. Judgment and thought content normal.  Nursing note and vitals reviewed.   ED Course  Procedures (including critical care time) Medications  0.9 %  sodium chloride infusion ( Intravenous New Bag/Given 02/23/14 0912)  HYDROmorphone (DILAUDID) injection 1.5  mg (1.5 mg Intravenous Given 02/23/14 1052)  ketorolac (TORADOL) 30 MG/ML injection 30 mg (30 mg Intravenous Given 02/23/14 0911)  ondansetron (ZOFRAN) injection 4 mg (4 mg Intravenous Given 02/23/14 0912)  diphenhydrAMINE (BENADRYL) injection 25 mg (25 mg Intravenous Given 02/23/14 0911)    Patient Vitals for the past 24 hrs:  BP Temp Temp src Pulse Resp SpO2  02/23/14 0845 142/83 mmHg 97.9 F (36.6 C) Oral 87 20 94 %    10:20 AM Reevaluation with update and discussion. After initial assessment and treatment, an updated evaluation reveals , at this time.  After 2 doses of Dilaudid, his pain is 7/10, and he requires additional analgesia.  He reports that this is an improvement of his pain.  Will continue previously  ordered medications, and encourage  20 minute boluses. Janos Shampine L   11:10 AM- at this time.  After the third dose of Dilaudid, he feels like he is comfortable enough to go home, and follow-up with his sickle cell doctor, tomorrow at a scheduled appointment.  His discuss with the patient, all questions answered.   Labs Review Labs Reviewed  CBC WITH DIFFERENTIAL/PLATELET - Abnormal; Notable for the following:    WBC 14.1 (*)    RBC 2.51 (*)    Hemoglobin 8.3 (*)    HCT 24.8 (*)    RDW 22.8 (*)    Platelets 665 (*)    Neutro Abs 9.7 (*)    Monocytes Absolute 1.7 (*)    All other components within normal limits  LACTATE DEHYDROGENASE - Abnormal; Notable for the following:    LDH 556 (*)    All other components within normal limits  BASIC METABOLIC PANEL    Imaging Review Dg Chest 2 View  02/23/2014   CLINICAL DATA:  Sickle cell crisis, cough, congestion  EXAM: CHEST  2 VIEW  COMPARISON:  02/03/2014  FINDINGS: Borderline cardiomegaly again noted. Stable central mild vascular congestion. No definite superimposed infiltrate or pulmonary edema. Stable left Port-A-Cath position. Bony thorax is stable.  IMPRESSION: Borderline cardiomegaly. Central vascular congestion without  convincing pulmonary edema. No segmental infiltrate.   Electronically Signed   By: Lahoma Crocker M.D.   On: 02/23/2014 09:36     EKG Interpretation None      MDM   Final diagnoses:  Vaso-occlusive sickle cell crisis     Recurrent sickle cell crisis, with stable hemoglobin, and recently elevated LDH, which is stable.  I suspect mild crisis that can be treated at home with close follow-up.  Nursing Notes Reviewed/ Care Coordinated Applicable Imaging Reviewed Interpretation of Laboratory Data incorporated into ED treatment  The patient appears reasonably screened and/or stabilized for discharge and I doubt any other medical condition or other [facility] requiring further screening, evaluation, or treatment in the ED at this time prior to discharge.  Plan: Home Medications- usual; Home Treatments- rest; return here if the recommended treatment, does not improve the symptoms; Recommended follow up- PCP tomorrow as scheduled.     Richarda Blade, MD 02/23/14 1113

## 2014-02-23 NOTE — ED Notes (Signed)
Pt c/o being awakened by L side ribcage pain that was unrelieved by his home PO dilaudid. Pt denies SOB or CP. Pt is A&O and in NAD

## 2014-02-24 ENCOUNTER — Ambulatory Visit: Payer: Self-pay | Admitting: Internal Medicine

## 2014-02-24 ENCOUNTER — Ambulatory Visit: Payer: Self-pay | Admitting: Family Medicine

## 2014-02-26 ENCOUNTER — Other Ambulatory Visit: Payer: Self-pay | Admitting: Internal Medicine

## 2014-02-26 ENCOUNTER — Telehealth (HOSPITAL_COMMUNITY): Payer: Self-pay | Admitting: Hematology

## 2014-02-26 ENCOUNTER — Non-Acute Institutional Stay (HOSPITAL_COMMUNITY)
Admission: AD | Admit: 2014-02-26 | Discharge: 2014-02-26 | Disposition: A | Discharge status: Home / Self Care | Payer: Medicare Other | Attending: Internal Medicine | Admitting: Internal Medicine

## 2014-02-26 ENCOUNTER — Encounter (HOSPITAL_COMMUNITY): Payer: Self-pay | Admitting: *Deleted

## 2014-02-26 DIAGNOSIS — D571 Sickle-cell disease without crisis: Secondary | ICD-10-CM

## 2014-02-26 DIAGNOSIS — R52 Pain, unspecified: Secondary | ICD-10-CM | POA: Insufficient documentation

## 2014-02-26 DIAGNOSIS — D5700 Hb-SS disease with crisis, unspecified: Secondary | ICD-10-CM | POA: Diagnosis not present

## 2014-02-26 DIAGNOSIS — G894 Chronic pain syndrome: Secondary | ICD-10-CM

## 2014-02-26 MED ORDER — SODIUM CHLORIDE 0.9 % IV SOLN: 25.0000 mg | INTRAVENOUS | Status: DC | PRN

## 2014-02-26 MED ORDER — HYDROMORPHONE HCL 4 MG PO TABS
4.0000 mg | ORAL_TABLET | Freq: Once | ORAL | Status: AC
  Administered 2014-02-26: 4 mg via ORAL
  Filled 2014-02-26: qty 1

## 2014-02-26 MED ORDER — SODIUM CHLORIDE 0.45 % IV SOLN
INTRAVENOUS | Status: DC
  Administered 2014-02-26: 10:00:00 via INTRAVENOUS

## 2014-02-26 MED ORDER — HYDROMORPHONE HCL 2 MG/ML IJ SOLN
2.5000 mg | Freq: Once | INTRAMUSCULAR | Status: AC
  Administered 2014-02-26: 2.5 mg via INTRAVENOUS

## 2014-02-26 MED ORDER — HEPARIN SOD (PORK) LOCK FLUSH 100 UNIT/ML IV SOLN
500.0000 [IU] | INTRAVENOUS | Status: AC | PRN
  Administered 2014-02-26: 500 [IU]
  Filled 2014-02-26: qty 5

## 2014-02-26 MED ORDER — HYDROMORPHONE 2 MG/ML HIGH CONCENTRATION IV PCA SOLN
INTRAVENOUS | Status: DC
  Administered 2014-02-26: 12:00:00 via INTRAVENOUS
  Administered 2014-02-26: 10.4 mg via INTRAVENOUS
  Filled 2014-02-26: qty 25

## 2014-02-26 MED ORDER — ONDANSETRON HCL 4 MG/2ML IJ SOLN: 4.0000 mg | Freq: Four times a day (QID) | INTRAMUSCULAR | Status: DC | PRN

## 2014-02-26 MED ORDER — HYDROMORPHONE HCL 2 MG/ML IJ SOLN
2.0000 mg | Freq: Once | INTRAMUSCULAR | Status: AC
  Administered 2014-02-26: 2 mg via INTRAVENOUS
  Filled 2014-02-26: qty 1

## 2014-02-26 MED ORDER — HYDROMORPHONE HCL 2 MG/ML IJ SOLN
INTRAMUSCULAR | Status: AC
  Filled 2014-02-26: qty 2

## 2014-02-26 MED ORDER — NALOXONE HCL 0.4 MG/ML IJ SOLN: 0.4000 mg | INTRAMUSCULAR | Status: DC | PRN

## 2014-02-26 MED ORDER — SODIUM CHLORIDE 0.9 % IJ SOLN: 9.0000 mL | INTRAMUSCULAR | Status: DC | PRN

## 2014-02-26 MED ORDER — SODIUM CHLORIDE 0.9 % IJ SOLN
10.0000 mL | INTRAMUSCULAR | Status: AC | PRN
  Administered 2014-02-26: 10 mL

## 2014-02-26 MED ORDER — KETOROLAC TROMETHAMINE 30 MG/ML IJ SOLN
30.0000 mg | Freq: Once | INTRAMUSCULAR | Status: AC
  Administered 2014-02-26: 30 mg via INTRAVENOUS
  Filled 2014-02-26: qty 1

## 2014-02-26 MED ORDER — HYDROMORPHONE HCL 2 MG/ML IJ SOLN
2.5000 mg | Freq: Once | INTRAMUSCULAR | Status: AC
  Administered 2014-02-26: 2.5 mg via INTRAVENOUS
  Filled 2014-02-26: qty 2

## 2014-02-26 MED ORDER — DIPHENHYDRAMINE HCL 25 MG PO CAPS: 25.0000 mg | ORAL_CAPSULE | Freq: Four times a day (QID) | ORAL | Status: DC | PRN

## 2014-02-26 MED ORDER — MORPHINE SULFATE ER 30 MG PO TBCR: 30.0000 mg | EXTENDED_RELEASE_TABLET | Freq: Two times a day (BID) | ORAL | Status: DC

## 2014-02-26 NOTE — Progress Notes (Signed)
Patient ID: [name], male   DOB: 1980/05/17, 33 y.o.   MRN: [id number] Discharge instructions given to patient along with follow up information.  Patient states pain is currently 4/10 on pain scale and he can manage at home.  Patient instructed to pick up prescription from front office.  Patient verbalizes understanding.

## 2014-02-26 NOTE — Telephone Encounter (Signed)
Spoke with Dr. Zigmund Daniel and it is ok for patient to come to [facility].  Patient verbalizes understanding.

## 2014-02-26 NOTE — Discharge Instructions (Signed)
Sickle Cell Anemia, Adult °Sickle cell anemia is a condition in which red blood cells have an abnormal "sickle" shape. This abnormal shape shortens the cells' life span, which results in a lower than normal concentration of red blood cells in the blood. The sickle shape also causes the cells to clump together and block free blood flow through the blood vessels. As a result, the tissues and organs of the body do not receive enough oxygen. Sickle cell anemia causes organ damage and pain and increases the risk of infection. °CAUSES  °Sickle cell anemia is a genetic disorder. Those who receive two copies of the gene have the condition, and those who receive one copy have the trait. °RISK FACTORS °The sickle cell gene is most common in people whose families originated in [place]. Other areas of the globe where sickle cell trait occurs include the [place], [place] and [place], the [place], and the [place].  °SIGNS AND SYMPTOMS °· Pain, especially in the extremities, back, chest, or abdomen (common). The pain may start suddenly or may develop following an illness, especially if there is dehydration. Pain can also occur due to overexertion or exposure to extreme temperature changes. °· Frequent severe bacterial infections, especially certain types of pneumonia and meningitis. °· Pain and swelling in the hands and feet. °· Decreased activity.   °· Loss of appetite.   °· Change in behavior. °· Headaches. °· Seizures. °· Shortness of breath or difficulty breathing. °· Vision changes. °· Skin ulcers. °Those with the trait may not have symptoms or they may have mild symptoms.  °DIAGNOSIS  °Sickle cell anemia is diagnosed with blood tests that demonstrate the genetic trait. It is often diagnosed during the newborn period, due to mandatory testing nationwide. A variety of blood tests, X-rays, CT scans, MRI scans, ultrasounds, and lung function tests may also be done to monitor the condition. °TREATMENT  °Sickle  cell anemia may be treated with: °· Medicines. You may be given pain medicines, antibiotic medicines (to treat and prevent infections) or medicines to increase the production of certain types of hemoglobin. °· Fluids. °· Oxygen. °· Blood transfusions. °HOME CARE INSTRUCTIONS  °· Drink enough fluid to keep your urine clear or pale yellow. Increase your fluid intake in hot weather and during exercise. °· Do not smoke. Smoking lowers oxygen levels in the blood.   °· Only take over-the-counter or prescription medicines for pain, fever, or discomfort as directed by your health care provider. °· Take antibiotics as directed by your health care provider. Make sure you finish them it even if you start to feel better.   °· Take supplements as directed by your health care provider.   °· Consider wearing a medical alert bracelet. This tells anyone caring for you in an emergency of your condition.   °· When traveling, keep your medical information, health care provider's names, and the medicines you take with you at all times.   °· If you develop a fever, do not take medicines to reduce the fever right away. This could cover up a problem that is developing. Notify your health care provider. °· Keep all follow-up appointments with your health care provider. Sickle cell anemia requires regular medical care. °SEEK MEDICAL CARE IF: ° You have a fever. °SEEK IMMEDIATE MEDICAL CARE IF:  °· You feel dizzy or faint.   °· You have new abdominal pain, especially on the left side near the stomach area.   °· You develop a persistent, often uncomfortable and painful penile erection (priapism). If this is not treated immediately it   will lead to impotence.   °· You have numbness your arms or legs or you have a hard time moving them.   °· You have a hard time with speech.   °· You have a fever or persistent symptoms for more than 2-3 days.   °· You have a fever and your symptoms suddenly get worse.   °· You have signs or symptoms of infection.  These include:   °¨ Chills.   °¨ Abnormal tiredness (lethargy).   °¨ Irritability.   °¨ Poor eating.   °¨ Vomiting.   °· You develop pain that is not helped with medicine.   °· You develop shortness of breath. °· You have pain in your chest.   °· You are coughing up pus-like or bloody sputum.   °· You develop a stiff neck. °· Your feet or hands swell or have pain. °· Your abdomen appears bloated. °· You develop joint pain. °MAKE SURE YOU: °· Understand these instructions. °Document Released: 04/27/2005 Document Revised: 06/03/2013 Document Reviewed: 08/29/2012 °[employer]® Patient Information ©2015 [employer]. This information is not intended to replace advice given to you by your health care provider. Make sure you discuss any questions you have with your health care provider. ° °

## 2014-02-26 NOTE — Telephone Encounter (Signed)
Prescription written for MS Contin 30 mg #60

## 2014-02-26 NOTE — Telephone Encounter (Signed)
Patient C/O pain to ribs bilaterally that is 8/10.  Patient denies N/V/D, patient denies chest pain and shortness of breath.  I explained I would notify the physician and give him a call back.  Patient verbalizes understanding.

## 2014-03-03 ENCOUNTER — Emergency Department (HOSPITAL_COMMUNITY)
Admission: EM | Admit: 2014-03-03 | Discharge: 2014-03-03 | Disposition: A | Discharge status: Home / Self Care | Payer: Medicare Other | Source: Home / Self Care | Attending: Emergency Medicine | Admitting: Emergency Medicine

## 2014-03-03 ENCOUNTER — Encounter (HOSPITAL_COMMUNITY): Payer: Self-pay | Admitting: Emergency Medicine

## 2014-03-03 DIAGNOSIS — Z7901 Long term (current) use of anticoagulants: Secondary | ICD-10-CM

## 2014-03-03 DIAGNOSIS — Z86711 Personal history of pulmonary embolism: Secondary | ICD-10-CM

## 2014-03-03 DIAGNOSIS — D5700 Hb-SS disease with crisis, unspecified: Secondary | ICD-10-CM | POA: Diagnosis not present

## 2014-03-03 DIAGNOSIS — Z8659 Personal history of other mental and behavioral disorders: Secondary | ICD-10-CM | POA: Insufficient documentation

## 2014-03-03 DIAGNOSIS — I10 Essential (primary) hypertension: Secondary | ICD-10-CM | POA: Diagnosis present

## 2014-03-03 DIAGNOSIS — Z87891 Personal history of nicotine dependence: Secondary | ICD-10-CM

## 2014-03-03 DIAGNOSIS — Z79899 Other long term (current) drug therapy: Secondary | ICD-10-CM | POA: Insufficient documentation

## 2014-03-03 DIAGNOSIS — Z96641 Presence of right artificial hip joint: Secondary | ICD-10-CM | POA: Diagnosis present

## 2014-03-03 DIAGNOSIS — R0989 Other specified symptoms and signs involving the circulatory and respiratory systems: Secondary | ICD-10-CM | POA: Diagnosis not present

## 2014-03-03 DIAGNOSIS — Z8619 Personal history of other infectious and parasitic diseases: Secondary | ICD-10-CM

## 2014-03-03 DIAGNOSIS — E876 Hypokalemia: Secondary | ICD-10-CM

## 2014-03-03 DIAGNOSIS — Z87798 Personal history of other (corrected) congenital malformations: Secondary | ICD-10-CM

## 2014-03-03 DIAGNOSIS — J9621 Acute and chronic respiratory failure with hypoxia: Secondary | ICD-10-CM | POA: Diagnosis present

## 2014-03-03 DIAGNOSIS — I272 Other secondary pulmonary hypertension: Secondary | ICD-10-CM | POA: Diagnosis present

## 2014-03-03 DIAGNOSIS — R748 Abnormal levels of other serum enzymes: Secondary | ICD-10-CM | POA: Diagnosis present

## 2014-03-03 DIAGNOSIS — I270 Primary pulmonary hypertension: Secondary | ICD-10-CM | POA: Diagnosis not present

## 2014-03-03 DIAGNOSIS — J811 Chronic pulmonary edema: Secondary | ICD-10-CM | POA: Diagnosis present

## 2014-03-03 DIAGNOSIS — Z9114 Patient's other noncompliance with medication regimen: Secondary | ICD-10-CM | POA: Diagnosis present

## 2014-03-03 DIAGNOSIS — I517 Cardiomegaly: Secondary | ICD-10-CM | POA: Diagnosis not present

## 2014-03-03 DIAGNOSIS — R071 Chest pain on breathing: Secondary | ICD-10-CM | POA: Diagnosis not present

## 2014-03-03 LAB — CBC WITH DIFFERENTIAL/PLATELET
Basophils Absolute: 0 10*3/uL (ref 0.0–0.1)
Basophils Relative: 0 % (ref 0–1)
Eosinophils Absolute: 0.5 10*3/uL (ref 0.0–0.7)
Eosinophils Relative: 3 % (ref 0–5)
HCT: 22.8 % — ABNORMAL LOW (ref 39.0–52.0)
Hemoglobin: 7.7 g/dL — ABNORMAL LOW (ref 13.0–17.0)
LYMPHS ABS: 3.7 10*3/uL (ref 0.7–4.0)
Lymphocytes Relative: 22 % (ref 12–46)
MCH: 31.8 pg (ref 26.0–34.0)
MCHC: 33.8 g/dL (ref 30.0–36.0)
MCV: 94.2 fL (ref 78.0–100.0)
MONOS PCT: 13 % — AB (ref 3–12)
Monocytes Absolute: 2.2 10*3/uL — ABNORMAL HIGH (ref 0.1–1.0)
NEUTROS ABS: 10.2 10*3/uL — AB (ref 1.7–7.7)
Neutrophils Relative %: 62 % (ref 43–77)
PLATELETS: 431 10*3/uL — AB (ref 150–400)
RBC: 2.42 MIL/uL — AB (ref 4.22–5.81)
RDW: 21.2 % — ABNORMAL HIGH (ref 11.5–15.5)
WBC: 16.6 10*3/uL — ABNORMAL HIGH (ref 4.0–10.5)
nRBC: 1 /100 WBC — ABNORMAL HIGH

## 2014-03-03 LAB — BASIC METABOLIC PANEL
ANION GAP: 8 (ref 5–15)
BUN: 13 mg/dL (ref 6–23)
CO2: 22 mmol/L (ref 19–32)
Calcium: 9 mg/dL (ref 8.4–10.5)
Chloride: 105 mmol/L (ref 96–112)
Creatinine, Ser: 0.54 mg/dL (ref 0.50–1.35)
Glucose, Bld: 128 mg/dL — ABNORMAL HIGH (ref 70–99)
Potassium: 3.9 mmol/L (ref 3.5–5.1)
SODIUM: 135 mmol/L (ref 135–145)

## 2014-03-03 LAB — RETICULOCYTES
RBC.: 2.42 MIL/uL — AB (ref 4.22–5.81)
RETIC CT PCT: 17 % — AB (ref 0.4–3.1)
Retic Count, Absolute: 411.4 10*3/uL — ABNORMAL HIGH (ref 19.0–186.0)

## 2014-03-03 MED ORDER — HYDROMORPHONE HCL 2 MG/ML IJ SOLN
2.0000 mg | Freq: Once | INTRAMUSCULAR | Status: AC
  Administered 2014-03-03: 1 mg via INTRAVENOUS
  Filled 2014-03-03: qty 1

## 2014-03-03 MED ORDER — DIPHENHYDRAMINE HCL 50 MG/ML IJ SOLN
25.0000 mg | Freq: Once | INTRAMUSCULAR | Status: AC
  Administered 2014-03-03: 25 mg via INTRAVENOUS
  Filled 2014-03-03: qty 1

## 2014-03-03 MED ORDER — HYDROMORPHONE HCL 2 MG/ML IJ SOLN
2.0000 mg | Freq: Once | INTRAMUSCULAR | Status: AC
  Administered 2014-03-03: 2 mg via INTRAVENOUS
  Filled 2014-03-03: qty 1

## 2014-03-03 MED ORDER — SODIUM CHLORIDE 0.9 % IV BOLUS (SEPSIS)
1000.0000 mL | Freq: Once | INTRAVENOUS | Status: AC
  Administered 2014-03-03: 1000 mL via INTRAVENOUS

## 2014-03-03 NOTE — Discharge Instructions (Signed)
Sickle Cell Anemia, Adult °Sickle cell anemia is a condition in which red blood cells have an abnormal "sickle" shape. This abnormal shape shortens the cells' life span, which results in a lower than normal concentration of red blood cells in the blood. The sickle shape also causes the cells to clump together and block free blood flow through the blood vessels. As a result, the tissues and organs of the body do not receive enough oxygen. Sickle cell anemia causes organ damage and pain and increases the risk of infection. °CAUSES  °Sickle cell anemia is a genetic disorder. Those who receive two copies of the gene have the condition, and those who receive one copy have the trait. °RISK FACTORS °The sickle cell gene is most common in people whose families originated in [place]. Other areas of the globe where sickle cell trait occurs include the [place], [place] and [place], the [place], and the [place].  °SIGNS AND SYMPTOMS °· Pain, especially in the extremities, back, chest, or abdomen (common). The pain may start suddenly or may develop following an illness, especially if there is dehydration. Pain can also occur due to overexertion or exposure to extreme temperature changes. °· Frequent severe bacterial infections, especially certain types of pneumonia and meningitis. °· Pain and swelling in the hands and feet. °· Decreased activity.   °· Loss of appetite.   °· Change in behavior. °· Headaches. °· Seizures. °· Shortness of breath or difficulty breathing. °· Vision changes. °· Skin ulcers. °Those with the trait may not have symptoms or they may have mild symptoms.  °DIAGNOSIS  °Sickle cell anemia is diagnosed with blood tests that demonstrate the genetic trait. It is often diagnosed during the newborn period, due to mandatory testing nationwide. A variety of blood tests, X-rays, CT scans, MRI scans, ultrasounds, and lung function tests may also be done to monitor the condition. °TREATMENT  °Sickle  cell anemia may be treated with: °· Medicines. You may be given pain medicines, antibiotic medicines (to treat and prevent infections) or medicines to increase the production of certain types of hemoglobin. °· Fluids. °· Oxygen. °· Blood transfusions. °HOME CARE INSTRUCTIONS  °· Drink enough fluid to keep your urine clear or pale yellow. Increase your fluid intake in hot weather and during exercise. °· Do not smoke. Smoking lowers oxygen levels in the blood.   °· Only take over-the-counter or prescription medicines for pain, fever, or discomfort as directed by your health care provider. °· Take antibiotics as directed by your health care provider. Make sure you finish them it even if you start to feel better.   °· Take supplements as directed by your health care provider.   °· Consider wearing a medical alert bracelet. This tells anyone caring for you in an emergency of your condition.   °· When traveling, keep your medical information, health care provider's names, and the medicines you take with you at all times.   °· If you develop a fever, do not take medicines to reduce the fever right away. This could cover up a problem that is developing. Notify your health care provider. °· Keep all follow-up appointments with your health care provider. Sickle cell anemia requires regular medical care. °SEEK MEDICAL CARE IF: ° You have a fever. °SEEK IMMEDIATE MEDICAL CARE IF:  °· You feel dizzy or faint.   °· You have new abdominal pain, especially on the left side near the stomach area.   °· You develop a persistent, often uncomfortable and painful penile erection (priapism). If this is not treated immediately it   will lead to impotence.   °· You have numbness your arms or legs or you have a hard time moving them.   °· You have a hard time with speech.   °· You have a fever or persistent symptoms for more than 2-3 days.   °· You have a fever and your symptoms suddenly get worse.   °· You have signs or symptoms of infection.  These include:   °¨ Chills.   °¨ Abnormal tiredness (lethargy).   °¨ Irritability.   °¨ Poor eating.   °¨ Vomiting.   °· You develop pain that is not helped with medicine.   °· You develop shortness of breath. °· You have pain in your chest.   °· You are coughing up pus-like or bloody sputum.   °· You develop a stiff neck. °· Your feet or hands swell or have pain. °· Your abdomen appears bloated. °· You develop joint pain. °MAKE SURE YOU: °· Understand these instructions. °Document Released: 04/27/2005 Document Revised: 06/03/2013 Document Reviewed: 08/29/2012 °[employer]® Patient Information ©2015 [employer]. This information is not intended to replace advice given to you by your health care provider. Make sure you discuss any questions you have with your health care provider. ° °

## 2014-03-03 NOTE — ED Notes (Signed)
PT NEEDS ENCOURAGEMENT TO INCREASE 02 SATS. RECOVERS

## 2014-03-03 NOTE — ED Notes (Signed)
Pt from home c/o bilateral flank pain and left ankle pain that started this a.m.at 0500. He took MS Contin w/o relief. HX of sickle Cell

## 2014-03-03 NOTE — ED Provider Notes (Signed)
CSN: [id number]     Arrival date & time 03/03/14  0709 History   First MD Initiated Contact with Patient 03/03/14 [phone]     Chief Complaint  Patient presents with  . Sickle Cell Pain Crisis     (Consider location/radiation/quality/duration/timing/severity/associated sxs/prior Treatment) HPI Comments: Patient is a 34 year old male with history of sickle cell disease. He has presented here multiple times recently in vaso-occlusive crisis. He started yesterday evening with this discomfort which has worsened throughout the night. His pain has not responded to his home medications.  Patient is a 34 y.o. male presenting with sickle cell pain. The history is provided by the patient.  Sickle Cell Pain Crisis Pain location: Ribs and legs. Severity:  Severe Onset quality:  Sudden Duration:  12 hours Similar to previous crisis episodes: yes   Timing:  Constant Progression:  Worsening Chronicity:  New History of pulmonary emboli: no   Relieved by:  Nothing Worsened by:  Nothing tried Ineffective treatments:  None tried   Past Medical History  Diagnosis Date  . Sickle cell anemia   . Blood transfusion   . Acute embolism and thrombosis of right internal jugular vein   . Hypokalemia   . Mood disorder   . History of pulmonary embolus (PE)   . Avascular necrosis   . Leukocytosis     Chronic  . Thrombocytosis     Chronic  . Hypertension   . History of Clostridium difficile infection   . Uses marijuana   . Chronic anticoagulation   . Functional asplenia   . Former smoker   . Second hand tobacco smoke exposure   . Alcohol consumption of one to four drinks per day   . Noncompliance with medication regimen   . Sickle-cell crisis with associated acute chest syndrome 05/13/2013  . Acute chest syndrome 06/18/2013  . Demand ischemia 01/02/2014   Past Surgical History  Procedure Laterality Date  . Right hip replacement      08/2006  . Cholecystectomy      01/2008  . Porta cath placement     . Porta cath removal    . Umbilical hernia repair      01/2008  . Excision of left periauricular cyst      10/2009  . Excision of right ear lobe cyst with primary closur      11/2007  . Portacath placement  01/05/2012    Procedure: INSERTION PORT-A-CATH;  Surgeon: Odis Hollingshead, MD;  Location: [place];  Service: General;  Laterality: N/A;  ultrasound guiced port a cath insertion with fluoroscopy   Family History  Problem Relation Age of Onset  . Sickle cell trait Mother   . Depression Mother   . Diabetes Mother   . Sickle cell trait Father   . Sickle cell trait Brother    History  Substance Use Topics  . Smoking status: Former Smoker -- 13 years    Quit date: 07/08/2010  . Smokeless tobacco: Never Used  . Alcohol Use: 1.2 oz/week    2 Shots of liquor per week    Review of Systems  All other systems reviewed and are negative.     Allergies  Review of patient's allergies indicates no known allergies.  Home Medications   Prior to Admission medications   Medication Sig Start Date End Date Taking? Authorizing Provider  Cholecalciferol (VITAMIN D) 2000 UNITS tablet Take 1 tablet (2,000 Units total) by mouth daily. 02/13/14  Yes [name], MD  folic acid (  FOLVITE) 1 MG tablet Take 1 tablet (1 mg total) by mouth every morning. 10/08/13  Yes [name], MD  HYDROmorphone (DILAUDID) 4 MG tablet Take 1 tablet (4 mg total) by mouth every 4 (four) hours as needed for severe pain. 02/11/14  Yes [name], MD  hydroxyurea (HYDREA) 500 MG capsule Take 3 capsules (1,500 mg total) by mouth daily. May take with food to minimize GI side effects. 10/30/13  Yes [name], MD  lisinopril (PRINIVIL,ZESTRIL) 10 MG tablet Take 1 tablet (10 mg total) by mouth daily. 01/22/14  Yes [name], MD  metoprolol tartrate (LOPRESSOR) 25 MG tablet Take 1 tablet (25 mg total) by mouth daily. 08/22/13  Yes [name], MD  morphine (MS CONTIN) 30 MG 12 hr  tablet Take 1 tablet (30 mg total) by mouth every 12 (twelve) hours. 02/26/14  Yes [name], MD  potassium chloride SA (K-DUR,KLOR-CON) 20 MEQ tablet Take 1 tablet (20 mEq total) by mouth every morning. 09/25/13  Yes [name], MD  rivaroxaban (XARELTO) 20 MG TABS tablet Take 1 tablet (20 mg total) by mouth every morning. 01/06/14  Yes [name], MD  zolpidem (AMBIEN) 10 MG tablet Take 1 tablet (10 mg total) by mouth at bedtime as needed for sleep. 10/28/13  Yes Dorena Dew, FNP   BP 133/82 mmHg  Pulse 102  Temp(Src) 97.4 F (36.3 C) (Oral)  Resp 18  SpO2 92% Physical Exam  Constitutional: He is oriented to person, place, and time. No distress.  Patient is a 34 year old male who appears somewhat pale, chronically ill, and uncomfortable.  HENT:  Head: Normocephalic and atraumatic.  Mucous membranes appear somewhat dry  Neck: Normal range of motion. Neck supple.  Cardiovascular: Normal rate, regular rhythm and normal heart sounds.   No murmur heard. Pulmonary/Chest: Effort normal and breath sounds normal. No respiratory distress. He has no wheezes.  Abdominal: Soft. Bowel sounds are normal. He exhibits no distension. There is no tenderness.  Musculoskeletal: Normal range of motion. He exhibits no edema.  Lymphadenopathy:    He has no cervical adenopathy.  Neurological: He is alert and oriented to person, place, and time.  Skin: Skin is warm and dry. He is not diaphoretic. There is pallor.  Nursing note and vitals reviewed.   ED Course  Procedures (including critical care time) Labs Review Labs Reviewed - No data to display  Imaging Review No results found.   EKG Interpretation None      MDM   Final diagnoses:  None    Patient eventually feeling better after IV fluids and pain medications. His lab work is consistent with a sickle crisis. He will be discharged to home, to return as needed for any problems. There is no fever, chest pain, or  evidence for acute chest.    Veryl Speak, MD 03/04/14 0800

## 2014-03-03 NOTE — ED Notes (Addendum)
02 SATS 89%- 90% 2LNC EDP DELO MADE AWARE. EDP PRESENT TO EVALUATE THIS PT. HOLD ING ON NEXT PAIN MEDICATION

## 2014-03-06 ENCOUNTER — Encounter (HOSPITAL_COMMUNITY): Payer: Self-pay | Admitting: Emergency Medicine

## 2014-03-06 ENCOUNTER — Inpatient Hospital Stay (HOSPITAL_COMMUNITY)
Admission: EM | Admit: 2014-03-06 | Discharge: 2014-03-09 | DRG: 811 | Disposition: A | Discharge status: Home / Self Care | Payer: Medicare Other | Attending: Internal Medicine | Admitting: Internal Medicine

## 2014-03-06 ENCOUNTER — Emergency Department (HOSPITAL_COMMUNITY): Payer: Medicare Other

## 2014-03-06 DIAGNOSIS — I270 Primary pulmonary hypertension: Secondary | ICD-10-CM

## 2014-03-06 DIAGNOSIS — D5700 Hb-SS disease with crisis, unspecified: Secondary | ICD-10-CM

## 2014-03-06 DIAGNOSIS — R7989 Other specified abnormal findings of blood chemistry: Secondary | ICD-10-CM

## 2014-03-06 DIAGNOSIS — Z86711 Personal history of pulmonary embolism: Secondary | ICD-10-CM | POA: Diagnosis present

## 2014-03-06 DIAGNOSIS — R778 Other specified abnormalities of plasma proteins: Secondary | ICD-10-CM

## 2014-03-06 DIAGNOSIS — Z7901 Long term (current) use of anticoagulants: Secondary | ICD-10-CM

## 2014-03-06 DIAGNOSIS — I272 Pulmonary hypertension, unspecified: Secondary | ICD-10-CM | POA: Diagnosis present

## 2014-03-06 DIAGNOSIS — R079 Chest pain, unspecified: Secondary | ICD-10-CM

## 2014-03-06 LAB — COMPREHENSIVE METABOLIC PANEL
ALT: 36 U/L (ref 0–53)
ANION GAP: 6 (ref 5–15)
AST: 39 U/L — ABNORMAL HIGH (ref 0–37)
Albumin: 4.1 g/dL (ref 3.5–5.2)
Alkaline Phosphatase: 89 U/L (ref 39–117)
BUN: 10 mg/dL (ref 6–23)
CALCIUM: 8.8 mg/dL (ref 8.4–10.5)
CHLORIDE: 109 mmol/L (ref 96–112)
CO2: 23 mmol/L (ref 19–32)
Creatinine, Ser: 0.57 mg/dL (ref 0.50–1.35)
GFR calc Af Amer: >90 mL/min (ref >90)
GLUCOSE: 124 mg/dL — AB (ref 70–99)
POTASSIUM: 3 mmol/L — AB (ref 3.5–5.1)
SODIUM: 138 mmol/L (ref 135–145)
Total Bilirubin: 6.5 mg/dL — ABNORMAL HIGH (ref 0.3–1.2)
Total Protein: 7.6 g/dL (ref 6.0–8.3)

## 2014-03-06 LAB — RETICULOCYTES: RBC.: 2.31 MIL/uL — ABNORMAL LOW (ref 4.22–5.81)

## 2014-03-06 LAB — TROPONIN I
Troponin I: 0.37 ng/mL — ABNORMAL HIGH (ref <0.031)
Troponin I: 2.16 ng/mL (ref <0.031)
Troponin I: 2.49 ng/mL (ref <0.031)

## 2014-03-06 LAB — I-STAT TROPONIN, ED: TROPONIN I, POC: 0.21 ng/mL — AB (ref 0.00–0.08)

## 2014-03-06 LAB — CBC WITH DIFFERENTIAL/PLATELET
Basophils Absolute: 0 10*3/uL (ref 0.0–0.1)
Basophils Relative: 0 % (ref 0–1)
EOS PCT: 2 % (ref 0–5)
Eosinophils Absolute: 0.4 10*3/uL (ref 0.0–0.7)
HCT: 21.8 % — ABNORMAL LOW (ref 39.0–52.0)
HEMOGLOBIN: 7.6 g/dL — AB (ref 13.0–17.0)
LYMPHS PCT: 13 % (ref 12–46)
Lymphs Abs: 2.7 10*3/uL (ref 0.7–4.0)
MCH: 32.9 pg (ref 26.0–34.0)
MCHC: 34.9 g/dL (ref 30.0–36.0)
MCV: 94.4 fL (ref 78.0–100.0)
MONO ABS: 2.3 10*3/uL — AB (ref 0.1–1.0)
Monocytes Relative: 11 % (ref 3–12)
NEUTROS ABS: 15.7 10*3/uL — AB (ref 1.7–7.7)
Neutrophils Relative %: 74 % (ref 43–77)
PLATELETS: 460 10*3/uL — AB (ref 150–400)
RBC: 2.31 MIL/uL — ABNORMAL LOW (ref 4.22–5.81)
RDW: 21.9 % — AB (ref 11.5–15.5)
WBC: 21.1 10*3/uL — ABNORMAL HIGH (ref 4.0–10.5)

## 2014-03-06 LAB — GLUCOSE, CAPILLARY
GLUCOSE-CAPILLARY: 109 mg/dL — AB (ref 70–99)
Glucose-Capillary: 86 mg/dL (ref 70–99)

## 2014-03-06 LAB — LACTATE DEHYDROGENASE: LDH: 471 U/L — ABNORMAL HIGH (ref 94–250)

## 2014-03-06 LAB — LIPASE, BLOOD: Lipase: 34 U/L (ref 11–59)

## 2014-03-06 LAB — MRSA PCR SCREENING: MRSA by PCR: POSITIVE — AB

## 2014-03-06 LAB — MAGNESIUM: Magnesium: 1.7 mg/dL (ref 1.5–2.5)

## 2014-03-06 IMAGING — CR DG CHEST 2V
2 series · 2 of 2 positions shown · non-contrast
Comparison: Multiple priors, most recently chest x-ray 03/05/2011.

CLINICAL DATA: History of cough.  Sickle cell disease.

CHEST - 2 VIEW

[w chest pa]
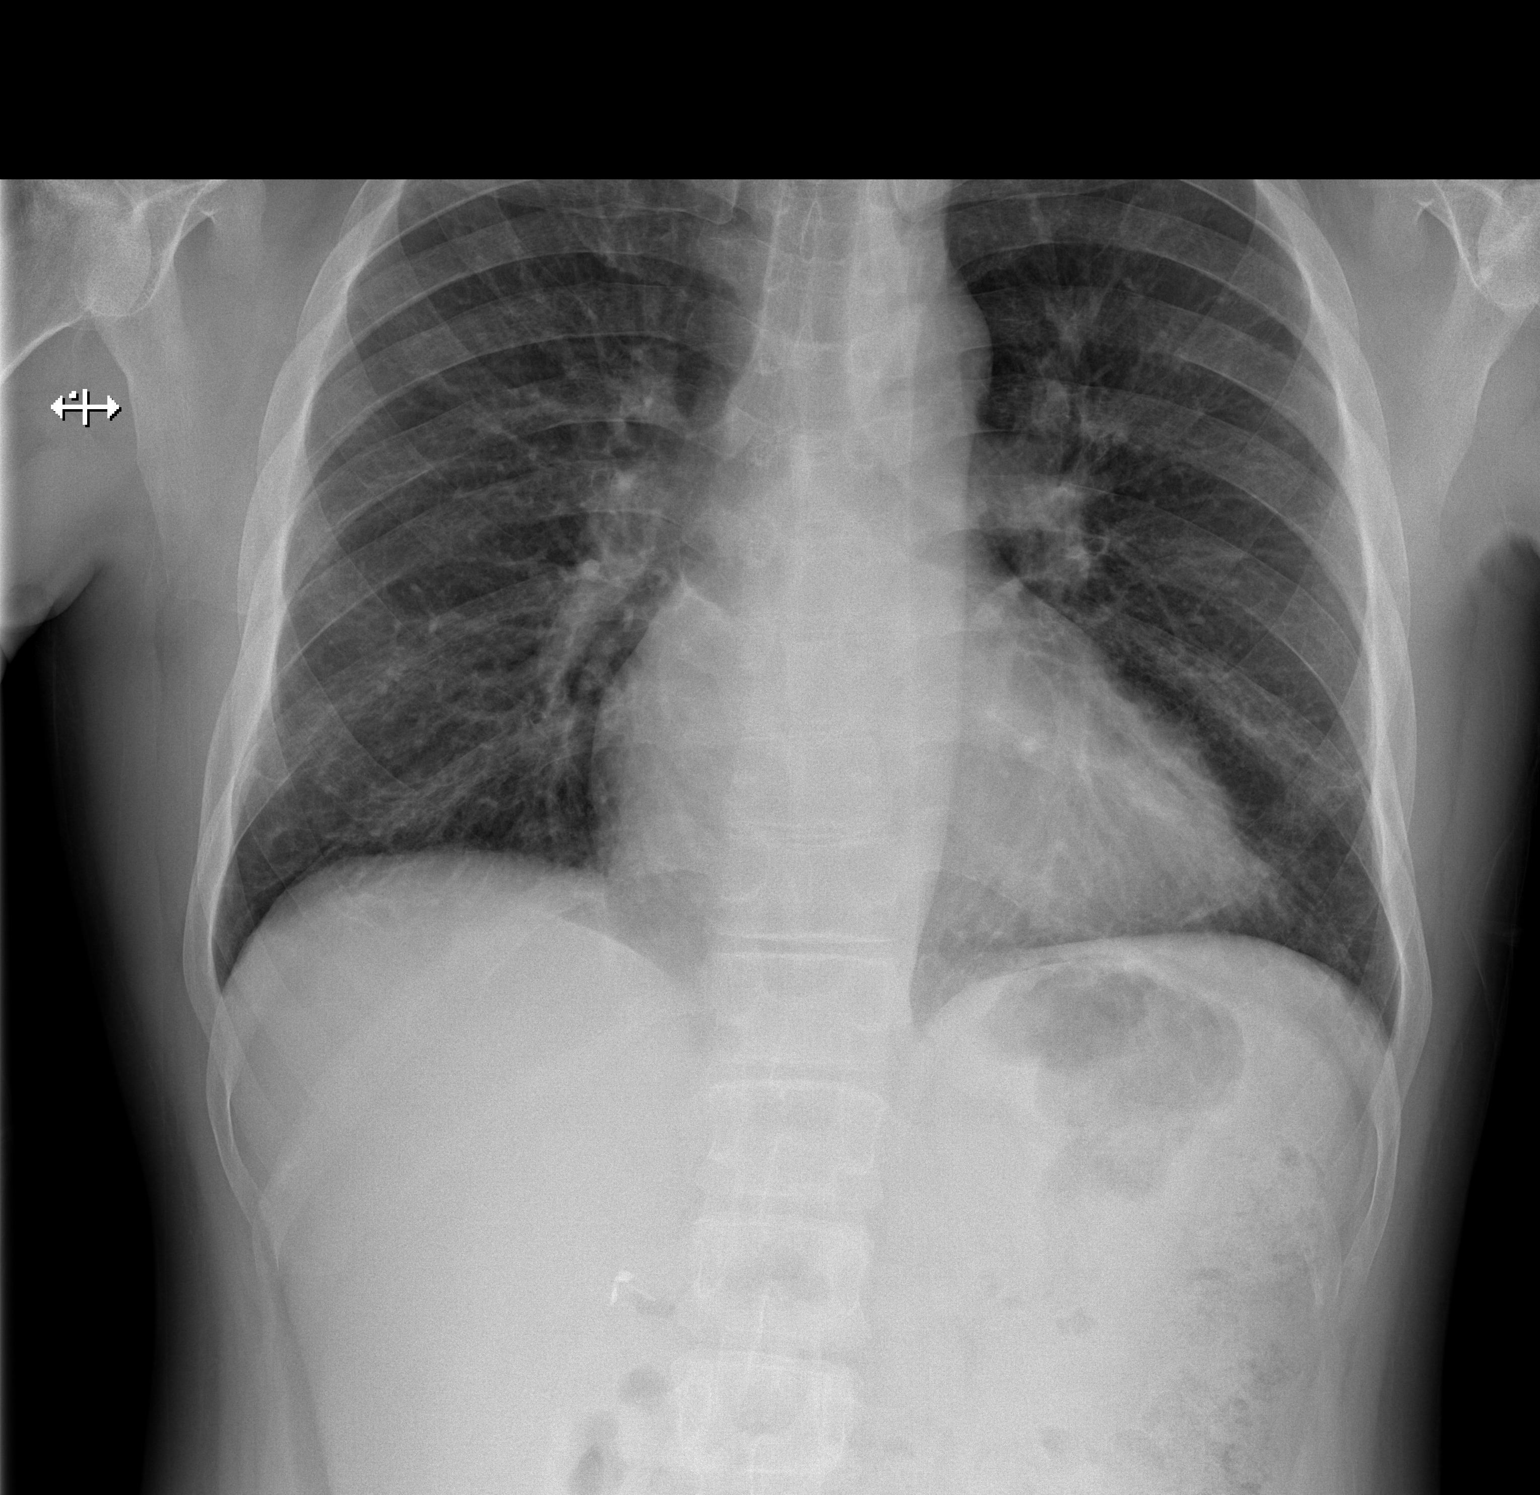

[w chest lat]
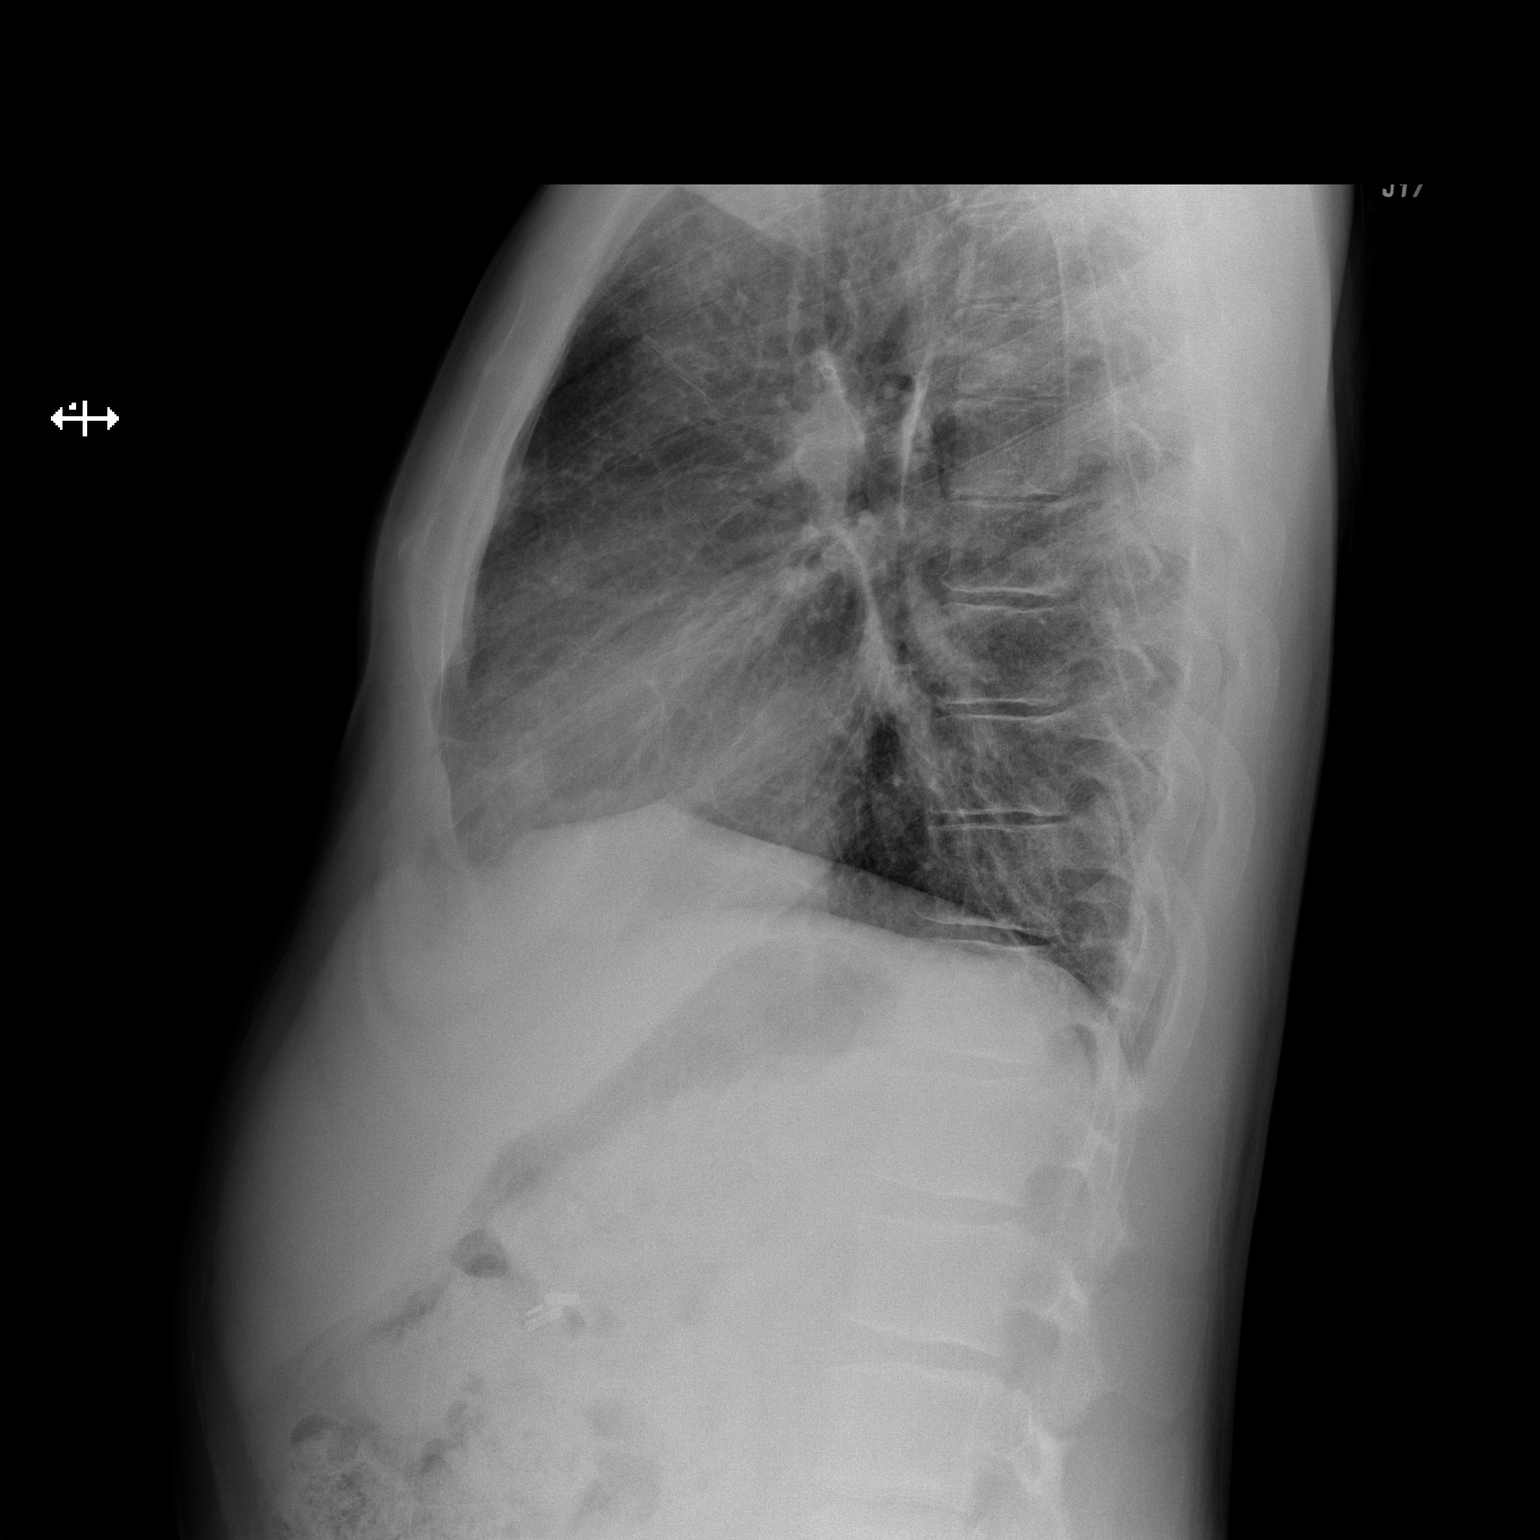

[2 of 2 positions shown; findings below may reference images not displayed]

FINDINGS: Lung volumes are normal.  No consolidative airspace
disease.  No pleural effusions.  Mild diffuse prominence of the
interstitial markings is similar compared to prior examinations.
Heart size is normal.  Mediastinal contours are within normal
limits.  Surgical clips projecting over the right upper quadrant of
the abdomen, consistent with prior cholecystectomy.
IMPRESSION: 1.  No radiographic evidence of acute cardiopulmonary disease.
2.  Mild diffuse interstitial prominence is similar compared to
numerous prior examinations and likely reflects areas of chronic
parenchymal scarring (better demonstrated on prior chest CT dated
12/16/2010).
[DATE].  Status post cholecystectomy.

## 2014-03-06 MED ORDER — HYDROMORPHONE HCL 2 MG/ML IJ SOLN
2.0000 mg | Freq: Once | INTRAMUSCULAR | Status: AC
  Administered 2014-03-06: 2 mg via INTRAVENOUS
  Filled 2014-03-06: qty 1

## 2014-03-06 MED ORDER — RIVAROXABAN 20 MG PO TABS
20.0000 mg | ORAL_TABLET | Freq: Every morning | ORAL | Status: DC
  Administered 2014-03-06: 20 mg via ORAL
  Filled 2014-03-06: qty 1

## 2014-03-06 MED ORDER — FOLIC ACID 1 MG PO TABS: 1.0000 mg | ORAL_TABLET | Freq: Every morning | ORAL | Status: DC

## 2014-03-06 MED ORDER — RIVAROXABAN 20 MG PO TABS
20.0000 mg | ORAL_TABLET | Freq: Every day | ORAL | Status: DC
  Administered 2014-03-07 – 2014-03-08 (×2): 20 mg via ORAL
  Filled 2014-03-06 (×2): qty 1

## 2014-03-06 MED ORDER — SODIUM CHLORIDE 0.9 % IJ SOLN
10.0000 mL | INTRAMUSCULAR | Status: DC | PRN
  Administered 2014-03-09 (×2): 10 mL
  Filled 2014-03-06 (×2): qty 40

## 2014-03-06 MED ORDER — MORPHINE SULFATE ER 30 MG PO TBCR
30.0000 mg | EXTENDED_RELEASE_TABLET | Freq: Two times a day (BID) | ORAL | Status: DC
  Administered 2014-03-06 – 2014-03-09 (×7): 30 mg via ORAL
  Filled 2014-03-06 (×7): qty 1

## 2014-03-06 MED ORDER — SENNOSIDES-DOCUSATE SODIUM 8.6-50 MG PO TABS
1.0000 | ORAL_TABLET | Freq: Two times a day (BID) | ORAL | Status: DC
  Administered 2014-03-06 – 2014-03-09 (×5): 1 via ORAL
  Filled 2014-03-06 (×7): qty 1

## 2014-03-06 MED ORDER — DIPHENHYDRAMINE HCL 12.5 MG/5ML PO ELIX: 12.5000 mg | ORAL_SOLUTION | Freq: Four times a day (QID) | ORAL | Status: DC | PRN

## 2014-03-06 MED ORDER — HYDROXYUREA 500 MG PO CAPS
1500.0000 mg | ORAL_CAPSULE | Freq: Every day | ORAL | Status: DC
  Administered 2014-03-06 – 2014-03-09 (×4): 1500 mg via ORAL
  Filled 2014-03-06 (×4): qty 3

## 2014-03-06 MED ORDER — KETOROLAC TROMETHAMINE 30 MG/ML IJ SOLN
30.0000 mg | Freq: Four times a day (QID) | INTRAMUSCULAR | Status: DC
  Administered 2014-03-06 – 2014-03-09 (×12): 30 mg via INTRAVENOUS
  Filled 2014-03-06 (×12): qty 1

## 2014-03-06 MED ORDER — NALOXONE HCL 0.4 MG/ML IJ SOLN: 0.4000 mg | INTRAMUSCULAR | Status: DC | PRN

## 2014-03-06 MED ORDER — DIPHENHYDRAMINE HCL 50 MG/ML IJ SOLN: 12.5000 mg | Freq: Four times a day (QID) | INTRAMUSCULAR | Status: DC | PRN

## 2014-03-06 MED ORDER — ONDANSETRON HCL 4 MG/2ML IJ SOLN
4.0000 mg | Freq: Once | INTRAMUSCULAR | Status: AC
  Administered 2014-03-06: 4 mg via INTRAVENOUS
  Filled 2014-03-06: qty 2

## 2014-03-06 MED ORDER — VITAMIN D 1000 UNITS PO TABS
2000.0000 [IU] | ORAL_TABLET | Freq: Every day | ORAL | Status: DC
  Administered 2014-03-06 – 2014-03-09 (×4): 2000 [IU] via ORAL
  Filled 2014-03-06 (×5): qty 2

## 2014-03-06 MED ORDER — HYDROMORPHONE HCL 2 MG/ML IJ SOLN
2.0000 mg | Freq: Once | INTRAMUSCULAR | Status: AC | PRN
  Administered 2014-03-06: 2 mg via INTRAVENOUS
  Filled 2014-03-06: qty 1

## 2014-03-06 MED ORDER — SODIUM CHLORIDE 0.9 % IJ SOLN: 9.0000 mL | INTRAMUSCULAR | Status: DC | PRN

## 2014-03-06 MED ORDER — SODIUM CHLORIDE 0.9 % IV BOLUS (SEPSIS)
1000.0000 mL | Freq: Once | INTRAVENOUS | Status: AC
Start: 2014-03-06 — End: 2014-03-06
  Administered 2014-03-06: 1000 mL via INTRAVENOUS

## 2014-03-06 MED ORDER — DIPHENHYDRAMINE HCL 50 MG/ML IJ SOLN
25.0000 mg | Freq: Once | INTRAMUSCULAR | Status: AC
  Administered 2014-03-06: 25 mg via INTRAVENOUS
  Filled 2014-03-06: qty 1

## 2014-03-06 MED ORDER — HYDROMORPHONE 0.3 MG/ML IV SOLN
INTRAVENOUS | Status: DC
  Filled 2014-03-06: qty 25

## 2014-03-06 MED ORDER — ZOLPIDEM TARTRATE 5 MG PO TABS
5.0000 mg | ORAL_TABLET | Freq: Once | ORAL | Status: AC
  Administered 2014-03-06: 5 mg via ORAL
  Filled 2014-03-06: qty 1

## 2014-03-06 MED ORDER — FOLIC ACID 1 MG PO TABS
1.0000 mg | ORAL_TABLET | Freq: Every day | ORAL | Status: DC
  Administered 2014-03-06 – 2014-03-09 (×4): 1 mg via ORAL
  Filled 2014-03-06 (×4): qty 1

## 2014-03-06 MED ORDER — HYDROMORPHONE 2 MG/ML HIGH CONCENTRATION IV PCA SOLN
INTRAVENOUS | Status: DC
Start: 2014-03-06 — End: 2014-03-07
  Administered 2014-03-06: 6 mg via INTRAVENOUS
  Administered 2014-03-06: 09:00:00 via INTRAVENOUS
  Administered 2014-03-06: 6.5 mg via INTRAVENOUS
  Administered 2014-03-06: 4.5 mg via INTRAVENOUS
  Administered 2014-03-07 (×2): 3.5 mg via INTRAVENOUS
  Administered 2014-03-07: 5.5 mg via INTRAVENOUS
  Filled 2014-03-06: qty 25

## 2014-03-06 MED ORDER — SODIUM CHLORIDE 0.9 % IV SOLN
25.0000 mg | INTRAVENOUS | Status: DC | PRN
  Administered 2014-03-06 – 2014-03-09 (×7): 25 mg via INTRAVENOUS
  Filled 2014-03-06 (×15): qty 0.5

## 2014-03-06 MED ORDER — HYDROMORPHONE HCL 2 MG/ML IJ SOLN
2.0000 mg | Freq: Once | INTRAMUSCULAR | Status: AC | PRN
Start: 2014-03-06 — End: 2014-03-06
  Administered 2014-03-06: 2 mg via INTRAVENOUS
  Filled 2014-03-06: qty 1

## 2014-03-06 MED ORDER — DEXTROSE-NACL 5-0.45 % IV SOLN
INTRAVENOUS | Status: DC
  Administered 2014-03-08: 75 mL/h via INTRAVENOUS
  Administered 2014-03-09: 03:00:00 via INTRAVENOUS

## 2014-03-06 MED ORDER — POTASSIUM CHLORIDE CRYS ER 20 MEQ PO TBCR
20.0000 meq | EXTENDED_RELEASE_TABLET | Freq: Every morning | ORAL | Status: DC
  Administered 2014-03-06 – 2014-03-09 (×4): 20 meq via ORAL
  Filled 2014-03-06 (×4): qty 1

## 2014-03-06 MED ORDER — LISINOPRIL 10 MG PO TABS
10.0000 mg | ORAL_TABLET | Freq: Every day | ORAL | Status: DC
  Administered 2014-03-06 – 2014-03-09 (×4): 10 mg via ORAL
  Filled 2014-03-06 (×4): qty 1

## 2014-03-06 MED ORDER — POLYETHYLENE GLYCOL 3350 17 G PO PACK: 17.0000 g | PACK | Freq: Every day | ORAL | Status: DC | PRN

## 2014-03-06 MED ORDER — ONDANSETRON HCL 4 MG/2ML IJ SOLN: 4.0000 mg | Freq: Four times a day (QID) | INTRAMUSCULAR | Status: DC | PRN

## 2014-03-06 MED ORDER — METOPROLOL TARTRATE 25 MG PO TABS
25.0000 mg | ORAL_TABLET | Freq: Every day | ORAL | Status: DC
  Administered 2014-03-06 – 2014-03-09 (×4): 25 mg via ORAL
  Filled 2014-03-06 (×4): qty 1

## 2014-03-06 NOTE — Progress Notes (Signed)
Pt arrivevd from ED at [phone]. Paged [employer] x3 for PCA pump and cont pulse ox.  No answer.  Called several floors looking for equipment. Will cont to search for PCA setup.  Pt states pain level is tolerable at this time.  Petra Kuba RN

## 2014-03-06 NOTE — ED Notes (Signed)
Pt states he has been having generalized chest pain starting when he woke up this morning. Pt vomited x2 upon arrival to room. Pt is alert and oriented.

## 2014-03-06 NOTE — ED Provider Notes (Signed)
CSN: [id number]     Arrival date & time 03/06/14  0205 History   First MD Initiated Contact with Patient 03/06/14 [phone]     Chief Complaint  Patient presents with  . Chest Pain  . Emesis     (Consider location/radiation/quality/duration/timing/severity/associated sxs/prior Treatment) HPI Comments: Patient is a 34 year old male with a history of sickle cell SS anemia, pulmonary embolus on chronic Xarelto, AVN, HTN, and acute chest syndrome. He presents to the emergency department for further evaluation of left-sided chest pain which began at 0100, waking him from sleep. Pain is worse with deep breathing. He describes the pain as sharp. He also reports similar pain in his upper chest and upper back. He has had 5 episodes of total emesis since symptom onset. He denies any hematemesis. Patient did not take any medications for symptoms. He did take a tablet of morphine for some mild pain yesterday afternoon. Patient reports mild shortness of breath. He denies associated fever, syncope, hemoptysis, melena or hematochezia, abdominal pain, urinary symptoms, and extremity numbness/weakness. He states he has been compliant with his Xarelto.  PCP - Dr. Liston Alba  Patient is a 34 y.o. male presenting with chest pain and vomiting. The history is provided by the patient. No language interpreter was used.  Chest Pain Associated symptoms: shortness of breath and vomiting   Associated symptoms: no abdominal pain and no fever   Emesis Associated symptoms: no abdominal pain and no diarrhea     Past Medical History  Diagnosis Date  . Sickle cell anemia   . Blood transfusion   . Acute embolism and thrombosis of right internal jugular vein   . Hypokalemia   . Mood disorder   . History of pulmonary embolus (PE)   . Avascular necrosis   . Leukocytosis     Chronic  . Thrombocytosis     Chronic  . Hypertension   . History of Clostridium difficile infection   . Uses marijuana   . Chronic  anticoagulation   . Functional asplenia   . Former smoker   . Second hand tobacco smoke exposure   . Alcohol consumption of one to four drinks per day   . Noncompliance with medication regimen   . Sickle-cell crisis with associated acute chest syndrome 05/13/2013  . Acute chest syndrome 06/18/2013  . Demand ischemia 01/02/2014   Past Surgical History  Procedure Laterality Date  . Right hip replacement      08/2006  . Cholecystectomy      01/2008  . Porta cath placement    . Porta cath removal    . Umbilical hernia repair      01/2008  . Excision of left periauricular cyst      10/2009  . Excision of right ear lobe cyst with primary closur      11/2007  . Portacath placement  01/05/2012    Procedure: INSERTION PORT-A-CATH;  Surgeon: Odis Hollingshead, MD;  Location: [facility];  Service: General;  Laterality: N/A;  ultrasound guiced port a cath insertion with fluoroscopy   Family History  Problem Relation Age of Onset  . Sickle cell trait Mother   . Depression Mother   . Diabetes Mother   . Sickle cell trait Father   . Sickle cell trait Brother    History  Substance Use Topics  . Smoking status: Former Smoker -- 13 years    Quit date: 07/08/2010  . Smokeless tobacco: Never Used  . Alcohol Use: 1.2 oz/week  2 Shots of liquor per week    Review of Systems  Constitutional: Negative for fever.  Respiratory: Positive for shortness of breath.   Cardiovascular: Positive for chest pain.  Gastrointestinal: Positive for vomiting. Negative for abdominal pain and diarrhea.  Neurological: Negative for syncope.  All other systems reviewed and are negative.   Allergies  Review of patient's allergies indicates no known allergies.  Home Medications   Prior to Admission medications   Medication Sig Start Date End Date Taking? Authorizing Provider  Cholecalciferol (VITAMIN D) 2000 UNITS tablet Take 1 tablet (2,000 Units total) by mouth daily. 02/13/14  Yes [name], MD   folic acid (FOLVITE) 1 MG tablet Take 1 tablet (1 mg total) by mouth every morning. 10/08/13  Yes [name], MD  HYDROmorphone (DILAUDID) 4 MG tablet Take 1 tablet (4 mg total) by mouth every 4 (four) hours as needed for severe pain. 02/11/14  Yes [name], MD  hydroxyurea (HYDREA) 500 MG capsule Take 3 capsules (1,500 mg total) by mouth daily. May take with food to minimize GI side effects. 10/30/13  Yes [name], MD  lisinopril (PRINIVIL,ZESTRIL) 10 MG tablet Take 1 tablet (10 mg total) by mouth daily. 01/22/14  Yes [name], MD  metoprolol tartrate (LOPRESSOR) 25 MG tablet Take 1 tablet (25 mg total) by mouth daily. 08/22/13  Yes [name], MD  morphine (MS CONTIN) 30 MG 12 hr tablet Take 1 tablet (30 mg total) by mouth every 12 (twelve) hours. 02/26/14  Yes [name], MD  potassium chloride SA (K-DUR,KLOR-CON) 20 MEQ tablet Take 1 tablet (20 mEq total) by mouth every morning. 09/25/13  Yes [name], MD  rivaroxaban (XARELTO) 20 MG TABS tablet Take 1 tablet (20 mg total) by mouth every morning. 01/06/14  Yes [name], MD  zolpidem (AMBIEN) 10 MG tablet Take 1 tablet (10 mg total) by mouth at bedtime as needed for sleep. 10/28/13  Yes Dorena Dew, FNP   BP 116/72 mmHg  Pulse 78  Temp(Src)   Resp 18  SpO2 96%   Physical Exam  Constitutional: He is oriented to person, place, and time. He appears well-developed and well-nourished. No distress.  Nonseptic appearing  HENT:  Head: Normocephalic and atraumatic.  Mouth/Throat: Oropharynx is clear and moist. No oropharyngeal exudate.  Eyes: Conjunctivae and EOM are normal. No scleral icterus.  Neck: Normal range of motion.  Cardiovascular: Normal rate, regular rhythm and intact distal pulses.   Pulmonary/Chest: Effort normal and breath sounds normal. No respiratory distress. He has no wheezes. He has no rales.  Mild tachypnea. Lungs clear b/l. Chest expansion  symmetric with some mild splinting.  Abdominal: Soft. There is tenderness.  Mild focal TTP in L mid abdomen. No peritoneal signs or guarding.  Musculoskeletal: Normal range of motion.  Neurological: He is alert and oriented to person, place, and time. He exhibits normal muscle tone. Coordination normal.  GCS 15. Speech is goal oriented. Patient moves extremities without ataxia.  Skin: Skin is warm and dry. No rash noted. He is not diaphoretic. No erythema. No pallor.  Psychiatric: He has a normal mood and affect. His behavior is normal.  Nursing note and vitals reviewed.   ED Course  Procedures (including critical care time) Labs Review Labs Reviewed  COMPREHENSIVE METABOLIC PANEL - Abnormal; Notable for the following:    Potassium 3.0 (*)    Glucose, Bld 124 (*)    AST 39 (*)  Total Bilirubin 6.5 (*)    All other components within normal limits  CBC WITH DIFFERENTIAL/PLATELET - Abnormal; Notable for the following:    WBC 21.1 (*)    RBC 2.31 (*)    Hemoglobin 7.6 (*)    HCT 21.8 (*)    RDW 21.9 (*)    Platelets 460 (*)    Neutro Abs 15.7 (*)    Monocytes Absolute 2.3 (*)    All other components within normal limits  RETICULOCYTES - Abnormal; Notable for the following:    Retic Ct Pct >23.0 (*)    RBC. 2.31 (*)    All other components within normal limits  TROPONIN I - Abnormal; Notable for the following:    Troponin I 0.37 (*)    All other components within normal limits  I-STAT TROPOININ, ED - Abnormal; Notable for the following:    Troponin i, poc 0.21 (*)    All other components within normal limits  LIPASE, BLOOD  TROPONIN I  TROPONIN I    Imaging Review Dg Chest 2 View  03/06/2014   CLINICAL DATA:  Sickle cell crisis, shortness of breath, upper chest pain for 48 hr.  EXAM: CHEST  2 VIEW  COMPARISON:  Chest radiograph February 23, 2014  FINDINGS: The cardiac silhouette is mild-to-moderately enlarged. Pulmonary vascular congestion, increased without pleural  effusion or focal consolidation. No pneumothorax.  LEFT chest Port-A-Cath with distal tip projecting cavoatrial junction. No pneumothorax. Soft tissue planes and included osseous structures are nonacute.  IMPRESSION: Mild cardiomegaly and pulmonary vascular congestion without focal consolidation.   Electronically Signed   By: Elon Alas   On: 03/06/2014 03:12     EKG Interpretation None      MDM   Final diagnoses:  Chest pain  Hb-SS disease with crisis    34 year old male with a history of sickle cell SS anemia presents to the emergency department for further evaluation of left-sided chest pain which woke him from sleep. Patient states that his symptoms are consistent with past episodes of sickle cell crisis pain. This is most recently referenced on evaluation from 02/20/14. Patient's labs today are consistent with acute crisis. Patient had an elevated i-STAT troponin to 0.21. This was confirmed with Troponin I of 0.37. Patient with mild cardiomegaly and pulmonary vascular congestion. This is consistent with his past chest x-rays. No evidence of focal consolidation or pneumonia. His EKG is stable compared to prior visits.  Case discussed with Jules Husbands of cardiology. He recommends that patient have his troponins trended. Patient, on recheck, is satting 96% on 3 L. Will attempt to lower O2 via  and reassess hypoxia/need for supplemental O2. He states his pain is still present, but much better. He is drinking a Coca [employer] and eating a [place] sandwich. Will admit to hospitalist for monitoring and trending of troponins. Suspect demand ischemia from crisis as cause of elevated Troponin today. Dr. Alcario Drought of [facility] to admit.   Filed Vitals:   03/06/14 0219 03/06/14 0300 03/06/14 0330 03/06/14 0439  BP:  129/78 115/75 116/72  Pulse:  84 77 78  Resp:  15 18 18   SpO2: 86% 90% 94% 96%       Antonietta Breach, PA-C 03/06/14 [place], MD 03/06/14 [phone]

## 2014-03-06 NOTE — ED Provider Notes (Signed)
Date: 03/06/2014  Rate: 90  Rhythm: normal sinus rhythm  QRS Axis: left  Intervals: PR prolonged,prolonged QTC  ST/T Wave abnormalities: ST TWC in anterior septal leads  Conduction Disutrbances:nonspecific intraventricular conduction delay  Narrative Interpretation:   Old EKG Reviewed: unchanged from 09 Feb 2014  Rolland Porter, MD, Alanson Aly, MD 03/06/14 [phone]

## 2014-03-06 NOTE — ED Notes (Signed)
Pt was on 2L [place] at 85% when walked in room, pt now on 3L [place] at 90%, PA made aware.

## 2014-03-06 NOTE — H&P (Addendum)
[facility] History and Physical  [name] IEP:[id number] DOB: Nov 20, 1980 DOA: 03/06/2014  Referring physician: EDP PCP: [name],[name], MD   Chief Complaint: Chest pain   HPI: [name] is a 34 y.o. male with h/o HGB SS disease who presents to the ED with chest pain, N/V, and SOB.  Symptoms onset suddenly and woke him from sleep at about 1 AM this morning.  He states that his left sided chest pain is similar to previous sickle cell crises.  No cough.  5 episodes of emesis including 2 witnessed in the ED.  Is on chronic Xarelto for h/o PE.  Review of Systems: Systems reviewed.  As above, otherwise negative  Past Medical History  Diagnosis Date  . Sickle cell anemia   . Blood transfusion   . Acute embolism and thrombosis of right internal jugular vein   . Hypokalemia   . Mood disorder   . History of pulmonary embolus (PE)   . Avascular necrosis   . Leukocytosis     Chronic  . Thrombocytosis     Chronic  . Hypertension   . History of Clostridium difficile infection   . Uses marijuana   . Chronic anticoagulation   . Functional asplenia   . Former smoker   . Second hand tobacco smoke exposure   . Alcohol consumption of one to four drinks per day   . Noncompliance with medication regimen   . Sickle-cell crisis with associated acute chest syndrome 05/13/2013  . Acute chest syndrome 06/18/2013  . Demand ischemia 01/02/2014   Past Surgical History  Procedure Laterality Date  . Right hip replacement      08/2006  . Cholecystectomy      01/2008  . Porta cath placement    . Porta cath removal    . Umbilical hernia repair      01/2008  . Excision of left periauricular cyst      10/2009  . Excision of right ear lobe cyst with primary closur      11/2007  . Portacath placement  01/05/2012    Procedure: INSERTION PORT-A-CATH;  Surgeon: Odis Hollingshead, MD;  Location: [facility];  Service: General;  Laterality: N/A;  ultrasound guiced port a cath insertion  with fluoroscopy   Social History:  reports that he quit smoking about 3 years ago. He has never used smokeless tobacco. He reports that he drinks about 1.2 oz of alcohol per week. He reports that he uses illicit drugs (Marijuana) about twice per week.  No Known Allergies  Family History  Problem Relation Age of Onset  . Sickle cell trait Mother   . Depression Mother   . Diabetes Mother   . Sickle cell trait Father   . Sickle cell trait Brother      Prior to Admission medications   Medication Sig Start Date End Date Taking? Authorizing Provider  Cholecalciferol (VITAMIN D) 2000 UNITS tablet Take 1 tablet (2,000 Units total) by mouth daily. 02/13/14  Yes [name], MD  folic acid (FOLVITE) 1 MG tablet Take 1 tablet (1 mg total) by mouth every morning. 10/08/13  Yes [name], MD  HYDROmorphone (DILAUDID) 4 MG tablet Take 1 tablet (4 mg total) by mouth every 4 (four) hours as needed for severe pain. 02/11/14  Yes [name], MD  hydroxyurea (HYDREA) 500 MG capsule Take 3 capsules (1,500 mg total) by mouth daily. May take with food to minimize GI side effects. 10/30/13  Yes  [name], MD  lisinopril (PRINIVIL,ZESTRIL) 10 MG tablet Take 1 tablet (10 mg total) by mouth daily. 01/22/14  Yes [name], MD  metoprolol tartrate (LOPRESSOR) 25 MG tablet Take 1 tablet (25 mg total) by mouth daily. 08/22/13  Yes [name], MD  morphine (MS CONTIN) 30 MG 12 hr tablet Take 1 tablet (30 mg total) by mouth every 12 (twelve) hours. 02/26/14  Yes [name], MD  potassium chloride SA (K-DUR,KLOR-CON) 20 MEQ tablet Take 1 tablet (20 mEq total) by mouth every morning. 09/25/13  Yes [name], MD  rivaroxaban (XARELTO) 20 MG TABS tablet Take 1 tablet (20 mg total) by mouth every morning. 01/06/14  Yes [name], MD  zolpidem (AMBIEN) 10 MG tablet Take 1 tablet (10 mg total) by mouth at bedtime as needed for sleep. 10/28/13  Yes  Dorena Dew, FNP   Physical Exam: Filed Vitals:   03/06/14 0530  BP: 128/84  Pulse: 96  Resp: 20    BP 128/84 mmHg  Pulse 96  Temp(Src)   Resp 20  SpO2 91%  General Appearance:    Alert, oriented, no distress, appears stated age  Head:    Normocephalic, atraumatic  Eyes:    PERRL, EOMI, sclera non-icteric        Nose:   Nares without drainage or epistaxis. Mucosa, turbinates normal  Throat:   Moist mucous membranes. Oropharynx without erythema or exudate.  Neck:   Supple. No carotid bruits.  No thyromegaly.  No lymphadenopathy.   Back:     No CVA tenderness, no spinal tenderness  Lungs:     Clear to auscultation bilaterally, without wheezes, rhonchi or rales  Chest wall:    No tenderness to palpitation  Heart:    Regular rate and rhythm without murmurs, gallops, rubs  Abdomen:     Soft, non-tender, nondistended, normal bowel sounds, no organomegaly  Genitalia:    deferred  Rectal:    deferred  Extremities:   No clubbing, cyanosis or edema.  Pulses:   2+ and symmetric all extremities  Skin:   Skin color, texture, turgor normal, no rashes or lesions  Lymph nodes:   Cervical, supraclavicular, and axillary nodes normal  Neurologic:   CNII-XII intact. Normal strength, sensation and reflexes      throughout    Labs on Admission:  Basic Metabolic Panel:  Recent Labs Lab 03/03/14 0729 03/06/14 0235  NA 135 138  K 3.9 3.0*  CL 105 109  CO2 22 23  GLUCOSE 128* 124*  BUN 13 10  CREATININE 0.54 0.57  CALCIUM 9.0 8.8   Liver Function Tests:  Recent Labs Lab 03/06/14 0235  AST 39*  ALT 36  ALKPHOS 89  BILITOT 6.5*  PROT 7.6  ALBUMIN 4.1    Recent Labs Lab 03/06/14 0235  LIPASE 34   No results for input(s): AMMONIA in the last 168 hours. CBC:  Recent Labs Lab 03/03/14 0729 03/06/14 0235  WBC 16.6* 21.1*  NEUTROABS 10.2* 15.7*  HGB 7.7* 7.6*  HCT 22.8* 21.8*  MCV 94.2 94.4  PLT 431* 460*   Cardiac Enzymes:  Recent Labs Lab 03/06/14 0235   TROPONINI 0.37*    BNP (last 3 results)  Recent Labs  01/01/14 0909  PROBNP 5790.0*   CBG: No results for input(s): GLUCAP in the last 168 hours.  Radiological Exams on Admission: Dg Chest 2 View  03/06/2014   CLINICAL DATA:  Sickle cell crisis, shortness of breath,  upper chest pain for 48 hr.  EXAM: CHEST  2 VIEW  COMPARISON:  Chest radiograph February 23, 2014  FINDINGS: The cardiac silhouette is mild-to-moderately enlarged. Pulmonary vascular congestion, increased without pleural effusion or focal consolidation. No pneumothorax.  LEFT chest Port-A-Cath with distal tip projecting cavoatrial junction. No pneumothorax. Soft tissue planes and included osseous structures are nonacute.  IMPRESSION: Mild cardiomegaly and pulmonary vascular congestion without focal consolidation.   Electronically Signed   By: Elon Alas   On: 03/06/2014 03:12    EKG: Independently reviewed.  ST segment depression and T wave inversions in the anterior leads, these are unchanged from priors.  Assessment/Plan Principal Problem:   Hb-SS disease with crisis Active Problems:   Hx of pulmonary embolus   Pulmonary HTN   Chronic anticoagulation   1. Chest pain - appears to be due to sickle cell crisis per patient's history, do suspect a component of demand ischemia in his chest pain given troponin elevation at 0.34 today (although no where near the 9.2 that it peaked at in December). 1. Serial trops, re-consult cardiology if trops elevate significantly further. 1. See also his December admit cardiology notes for their discussion regarding their diagnosis that the elevation in this patients troponins are due to pulmonary HTN and sickle cell anemia causing demand ischemia and not atherosclerotic plaque rupture ACS. 2. It is also true that patient has had elevation of troponins to this level or higher when checked with most sickle cell crisis admits over the past year since his large PE which resulted in  pulmonary HTN. 3. His EKG while very worrying for ischemia, is unchanged from all prior EKGs from his numerous visits over the past couple of years. 2. Dilaudid PCA per patients standing orders 3. Continue hydrea 4. Continue home long acting MS contin 2. Chronic anticoagulation - continue xeralto. 3. Acute on chronic respiratory failure with hypoxia - patient has new O2 requirement, appears to have baseline pulmonary edema on CXR that was also seen on previous admits with chest pain / sickle crisis over the past month, no crackles on ascultation.  Suspect this pulmonary edema and his new O2 requirement is related to pulmonary HTN.    Code Status: Full  Family Communication: No family in room Disposition Plan: Admit to inpatient   Time spent: 26 min  Jenefer Woerner M. [facility] Pager [phone]  If 7AM-7PM, please contact the day team taking care of the patient [email or web address] Password [email or web address] 03/06/2014, 5:42 AM

## 2014-03-06 NOTE — ED Notes (Signed)
Patient transported to X-ray 

## 2014-03-06 NOTE — ED Notes (Addendum)
EKG given to EDP, Knapp,I. For review. 

## 2014-03-06 NOTE — ED Notes (Signed)
Pt states he had sickle cell pain yesterday and took pain medication from home, but then woke up around 1 am w/ a severe chest pain/throbbing feeling w/ shortness of breath. Pt is on 2L [place] d/t low sats.

## 2014-03-06 NOTE — ED Notes (Signed)
Pt desat to 83% RA put on 2L [place]. PA aware at bedside.

## 2014-03-06 NOTE — Progress Notes (Signed)
CARE MANAGEMENT NOTE 03/06/2014  Patient:  [name], [name]   Account Number:  [id number]  Date Initiated:  03/06/2014  Documentation initiated by:  Governor Matos  Subjective/Objective Assessment:   pt with hx of ssc presents with increased pain and failed op treatment with home meds.     Action/Plan:   home when pain control met   Anticipated DC Date:  03/09/2014   Anticipated DC Plan:  HOME/SELF CARE  In-house referral  NA      DC Planning Services  NA      [facility] Choice  NA   Choice offered to / List presented to:  NA   DME arranged  NA      DME agency  NA        [place] agency  NA   Status of service:  In process, will continue to follow Medicare Important Message given?   (If response is "NO", the following Medicare IM given date fields will be blank) Date Medicare IM given:   Medicare IM given by:   Date Additional Medicare IM given:   Additional Medicare IM given by:    Discharge Disposition:    Per UR Regulation:  Reviewed for med. necessity/level of care/duration of stay  If discussed at [place] of Stay Meetings, dates discussed:    Comments:  [id number]/Willys Salvino Eldridge Dace, BSN, CCN: [phone] Case management. Chart reviewed for discharge planning and present needs. Discharge needs: none present at time of review. Next chart review due:  [id number]

## 2014-03-06 NOTE — Progress Notes (Signed)
CRITICAL VALUE ALERT  Critical value received:  Troponin 2.49  Date of notification:  03/06/2014  Time of notification:  1975  Critical value read back:Yes.    Nurse who received alert:  Ranae Pila RN   MD notified (1st page):  Jonelle Sidle  Time of first page:  1603  MD notified (2nd page):  Time of second page:  Responding MD:  Jonelle Sidle  Time MD responded:  [id number]

## 2014-03-07 DIAGNOSIS — D5700 Hb-SS disease with crisis, unspecified: Principal | ICD-10-CM

## 2014-03-07 LAB — CBC
HEMATOCRIT: 19.7 % — AB (ref 39.0–52.0)
Hemoglobin: 6.9 g/dL — CL (ref 13.0–17.0)
MCH: 33 pg (ref 26.0–34.0)
MCHC: 35 g/dL (ref 30.0–36.0)
MCV: 94.3 fL (ref 78.0–100.0)
Platelets: 391 10*3/uL (ref 150–400)
RBC: 2.09 MIL/uL — ABNORMAL LOW (ref 4.22–5.81)
RDW: 20 % — ABNORMAL HIGH (ref 11.5–15.5)
WBC: 20.7 10*3/uL — ABNORMAL HIGH (ref 4.0–10.5)

## 2014-03-07 MED ORDER — HYDROMORPHONE 2 MG/ML HIGH CONCENTRATION IV PCA SOLN
INTRAVENOUS | Status: DC
  Administered 2014-03-07: 3 mg via INTRAVENOUS
  Administered 2014-03-07: 2.5 mg via INTRAVENOUS
  Administered 2014-03-07: 6.5 mg via INTRAVENOUS
  Administered 2014-03-07: 1 mg via INTRAVENOUS
  Administered 2014-03-08: 3.7 mg via INTRAVENOUS
  Administered 2014-03-08: 1 mg via INTRAVENOUS
  Administered 2014-03-08 (×2): 1.5 mg via INTRAVENOUS
  Administered 2014-03-08: 3.5 mg via INTRAVENOUS
  Administered 2014-03-09: 7 mg via INTRAVENOUS
  Administered 2014-03-09: 0.5 mg via INTRAVENOUS
  Administered 2014-03-09: 3.5 mg via INTRAVENOUS
  Filled 2014-03-07: qty 25

## 2014-03-07 MED ORDER — HYDROMORPHONE HCL 2 MG/ML IJ SOLN
2.0000 mg | INTRAMUSCULAR | Status: DC | PRN
  Administered 2014-03-07 – 2014-03-09 (×20): 2 mg via INTRAVENOUS
  Filled 2014-03-07 (×20): qty 1

## 2014-03-07 NOTE — Progress Notes (Signed)
Subjective: A 34 year old gentleman admitted with sickle cell painful crisis. Patient is currently on Dilaudid PCA. There is question of possible cardiac involvement with increased troponin. He has used 48.5 mg of the Dilaudid within the last 24 hours. He still not feeling better. Pain is at 10 out of 10. No fever no cough no shortness of breath. He has had persistent leukocytosis. Patient is not on any continuous boluses at this point.  Objective: Vital signs in last 24 hours: Temp:  [98.1 F (36.7 C)-98.5 F (36.9 C)] 98.1 F (36.7 C) (02/05 0901) Pulse Rate:  [73-86] 86 (02/05 1002) Resp:  [14-18] 14 (02/05 0901) BP: (114-127)/(70-95) 127/70 mmHg (02/05 1002) SpO2:  [91 %-98 %] 96 % (02/05 0901) Weight:  [73.936 kg (163 lb)] 73.936 kg (163 lb) (02/04 1500) Weight change:  Last BM Date: 03/05/14  Intake/Output from previous day: 02/04 0701 - 02/05 0700 In: 2605 [P.O.:960; I.V.:1545; IV Piggyback:100] Out: 1200 [Urine:1200] Intake/Output this shift:    General appearance: alert, cooperative and no distress Eyes: conjunctivae/corneas clear. PERRL, EOM's intact. Fundi benign. Ears: normal TM's and external ear canals both ears Neck: no adenopathy, no carotid bruit, no JVD, supple, symmetrical, trachea midline and thyroid not enlarged, symmetric, no tenderness/mass/nodules Back: symmetric, no curvature. ROM normal. No CVA tenderness. Resp: clear to auscultation bilaterally Cardio: regular rate and rhythm, S1, S2 normal, no murmur, click, rub or gallop GI: soft, non-tender; bowel sounds normal; no masses,  no organomegaly Extremities: extremities normal, atraumatic, no cyanosis or edema Pulses: 2+ and symmetric Skin: Skin color, texture, turgor normal. No rashes or lesions Neurologic: Grossly normal  Lab Results:  Recent Labs  03/06/14 0235  WBC 21.1*  HGB 7.6*  HCT 21.8*  PLT 460*   BMET  Recent Labs  03/06/14 0235  NA 138  K 3.0*  CL 109  CO2 23  GLUCOSE 124*   BUN 10  CREATININE 0.57  CALCIUM 8.8    Studies/Results: Dg Chest 2 View  03/06/2014   CLINICAL DATA:  Sickle cell crisis, shortness of breath, upper chest pain for 48 hr.  EXAM: CHEST  2 VIEW  COMPARISON:  Chest radiograph February 23, 2014  FINDINGS: The cardiac silhouette is mild-to-moderately enlarged. Pulmonary vascular congestion, increased without pleural effusion or focal consolidation. No pneumothorax.  LEFT chest Port-A-Cath with distal tip projecting cavoatrial junction. No pneumothorax. Soft tissue planes and included osseous structures are nonacute.  IMPRESSION: Mild cardiomegaly and pulmonary vascular congestion without focal consolidation.   Electronically Signed   By: Elon Alas   On: 03/06/2014 03:12    Medications: I have reviewed the patient's current medications.  Assessment/Plan: 34 year old gentleman with history of sickle cell crisis here with sickle cell painful crisis. Next  #1 sickle cell painful crisis: Patient is not getting adequate pain control with the Dilaudid PCA and his Toradol. I will add physician assisted dosing at 2 mg every 2 hours for the next 24 hours. I will reassess his pain control at that time and see if we need to make further changes. Next  #2 sickle cell anemia: Hemoglobin is close to his baseline. We'll continue to monitor it. Next  #3 hypokalemia: We'll continue to replete his potassium.  #4 elevated troponins: Patient has chronically elevated troponins most likely from right heart disease. He has pulmonary hypertension and was seen by cardiology last admission. At that time his troponin was as high as 10. He has no other findings on telemetry so I will discontinue telemetry. Next  #5  chronic anticoagulation. Continues Xarelto   LOS: 1 day   [name],[name] 03/07/2014, 10:20 AM

## 2014-03-08 LAB — CBC WITH DIFFERENTIAL/PLATELET
BASOS PCT: 1 % (ref 0–1)
Basophils Absolute: 0.1 10*3/uL (ref 0.0–0.1)
EOS ABS: 0.7 10*3/uL (ref 0.0–0.7)
EOS PCT: 4 % (ref 0–5)
HCT: 19.5 % — ABNORMAL LOW (ref 39.0–52.0)
HEMOGLOBIN: 6.8 g/dL — AB (ref 13.0–17.0)
LYMPHS PCT: 18 % (ref 12–46)
Lymphs Abs: 3.4 10*3/uL (ref 0.7–4.0)
MCH: 32.4 pg (ref 26.0–34.0)
MCHC: 34.9 g/dL (ref 30.0–36.0)
MCV: 92.9 fL (ref 78.0–100.0)
MONOS PCT: 12 % (ref 3–12)
Monocytes Absolute: 2.2 10*3/uL — ABNORMAL HIGH (ref 0.1–1.0)
NEUTROS ABS: 12.3 10*3/uL — AB (ref 1.7–7.7)
Neutrophils Relative %: 66 % (ref 43–77)
PLATELETS: 374 10*3/uL (ref 150–400)
RBC: 2.1 MIL/uL — AB (ref 4.22–5.81)
RDW: 19.6 % — AB (ref 11.5–15.5)
WBC: 18.8 10*3/uL — ABNORMAL HIGH (ref 4.0–10.5)

## 2014-03-08 LAB — COMPREHENSIVE METABOLIC PANEL
ALT: 34 U/L (ref 0–53)
AST: 44 U/L — ABNORMAL HIGH (ref 0–37)
Albumin: 4.4 g/dL (ref 3.5–5.2)
Alkaline Phosphatase: 106 U/L (ref 39–117)
Anion gap: 9 (ref 5–15)
BILIRUBIN TOTAL: 6.7 mg/dL — AB (ref 0.3–1.2)
BUN: 11 mg/dL (ref 6–23)
CALCIUM: 8.8 mg/dL (ref 8.4–10.5)
CHLORIDE: 110 mmol/L (ref 96–112)
CO2: 21 mmol/L (ref 19–32)
CREATININE: 0.66 mg/dL (ref 0.50–1.35)
GFR calc Af Amer: >90 mL/min (ref >90)
GFR calc non Af Amer: >90 mL/min (ref >90)
GLUCOSE: 111 mg/dL — AB (ref 70–99)
POTASSIUM: 4 mmol/L (ref 3.5–5.1)
Sodium: 140 mmol/L (ref 135–145)
TOTAL PROTEIN: 7.7 g/dL (ref 6.0–8.3)

## 2014-03-08 MED ORDER — LEVOFLOXACIN 500 MG PO TABS
500.0000 mg | ORAL_TABLET | Freq: Every day | ORAL | Status: DC
  Administered 2014-03-08: 500 mg via ORAL
  Filled 2014-03-08: qty 1

## 2014-03-08 MED ORDER — ZOLPIDEM TARTRATE 5 MG PO TABS
5.0000 mg | ORAL_TABLET | Freq: Once | ORAL | Status: AC
  Administered 2014-03-08: 5 mg via ORAL
  Filled 2014-03-08: qty 1

## 2014-03-08 NOTE — Progress Notes (Signed)
Subjective: Patient still having pain and requiring physician assisted dosing of 2 mg/2h. He was to be discharged but he is obviously not ready at this point. Pain is its 6 out of 10 went down to 4 out of 10. He has not been on his long-acting yet. He has used only 90 mg of the Dilaudid in the last 24 hours. No shortness of breath, no cough no nausea vomiting or diarrhea. Pain is localized to his rib cage and upper arms. It is worsened by any slight movement at this point.  Objective: Vital signs in last 24 hours: Temp:  [98.3 F (36.8 C)-98.6 F (37 C)] 98.6 F (37 C) (02/06 1003) Pulse Rate:  [89-98] 95 (02/06 1003) Resp:  [17-25] 25 (02/06 1146) BP: (115-147)/(75-87) 147/86 mmHg (02/06 1003) SpO2:  [83 %-95 %] 90 % (02/06 1146) Weight change:  Last BM Date: 03/05/14  Intake/Output from previous day: 02/05 0701 - 02/06 0700 In: 1711.3 [I.V.:1611.3; IV Piggyback:100] Out: 3606 [Urine:1775] Intake/Output this shift: Total I/O In: -  Out: 500 [Urine:500]  General appearance: alert, cooperative and no distress Eyes: conjunctivae/corneas clear. PERRL, EOM's intact. Fundi benign. Ears: normal TM's and external ear canals both ears Neck: no adenopathy, no carotid bruit, no JVD, supple, symmetrical, trachea midline and thyroid not enlarged, symmetric, no tenderness/mass/nodules Back: symmetric, no curvature. ROM normal. No CVA tenderness. Resp: clear to auscultation bilaterally Cardio: regular rate and rhythm, S1, S2 normal, no murmur, click, rub or gallop GI: soft, non-tender; bowel sounds normal; no masses,  no organomegaly Extremities: extremities normal, atraumatic, no cyanosis or edema Pulses: 2+ and symmetric Skin: Skin color, texture, turgor normal. No rashes or lesions Neurologic: Grossly normal  Lab Results:  Recent Labs  03/07/14 1106 03/08/14 0630  WBC 20.7* 18.8*  HGB 6.9* 6.8*  HCT 19.7* 19.5*  PLT 391 374   BMET  Recent Labs  03/06/14 0235 03/08/14 0630   NA 138 140  K 3.0* 4.0  CL 109 110  CO2 23 21  GLUCOSE 124* 111*  BUN 10 11  CREATININE 0.57 0.66  CALCIUM 8.8 8.8    Studies/Results: No results found.  Medications: I have reviewed the patient's current medications.  Assessment/Plan: 34 year old gentleman with history of sickle cell crisis here with sickle cell painful crisis. Next  #1 sickle cell painful crisis: Patient is getting adequate pain control but still on physician assisted dosing. I will give him another 24 hours while starting his long-acting medication. If he remains stable we'll discharge him in the morning.  #2 sickle cell anemia: Hemoglobin is close to his baseline. No evidence of hemolytic crisis at this point.   #3 hypokalemia: Potassium has been corrected. Continue to monitor.  #4 elevated troponins: Patient has chronically elevated troponins. No cardiac workup needed at this point.   #5 chronic anticoagulation. Continues Xarelto   LOS: 2 days   [name],[name] 03/08/2014, 1:14 PM

## 2014-03-09 DIAGNOSIS — D5700 Hb-SS disease with crisis, unspecified: Secondary | ICD-10-CM | POA: Diagnosis not present

## 2014-03-09 LAB — COMPREHENSIVE METABOLIC PANEL
ALK PHOS: 97 U/L (ref 39–117)
ALT: 32 U/L (ref 0–53)
ANION GAP: 7 (ref 5–15)
AST: 42 U/L — AB (ref 0–37)
Albumin: 4 g/dL (ref 3.5–5.2)
BUN: 12 mg/dL (ref 6–23)
CHLORIDE: 111 mmol/L (ref 96–112)
CO2: 22 mmol/L (ref 19–32)
Calcium: 8.9 mg/dL (ref 8.4–10.5)
Creatinine, Ser: 0.55 mg/dL (ref 0.50–1.35)
GFR calc Af Amer: >90 mL/min (ref >90)
Glucose, Bld: 113 mg/dL — ABNORMAL HIGH (ref 70–99)
Potassium: 3.9 mmol/L (ref 3.5–5.1)
Sodium: 140 mmol/L (ref 135–145)
Total Bilirubin: 7.8 mg/dL — ABNORMAL HIGH (ref 0.3–1.2)
Total Protein: 7.5 g/dL (ref 6.0–8.3)

## 2014-03-09 LAB — CBC WITH DIFFERENTIAL/PLATELET
Basophils Absolute: 0.1 10*3/uL (ref 0.0–0.1)
Basophils Relative: 1 % (ref 0–1)
EOS ABS: 0.3 10*3/uL (ref 0.0–0.7)
EOS PCT: 2 % (ref 0–5)
HEMATOCRIT: 18.8 % — AB (ref 39.0–52.0)
Hemoglobin: 6.9 g/dL — CL (ref 13.0–17.0)
LYMPHS ABS: 2 10*3/uL (ref 0.7–4.0)
LYMPHS PCT: 13 % (ref 12–46)
MCH: 34.7 pg — ABNORMAL HIGH (ref 26.0–34.0)
MCHC: 36.7 g/dL — ABNORMAL HIGH (ref 30.0–36.0)
MCV: 94.5 fL (ref 78.0–100.0)
Monocytes Absolute: 2.2 10*3/uL — ABNORMAL HIGH (ref 0.1–1.0)
Monocytes Relative: 14 % — ABNORMAL HIGH (ref 3–12)
NEUTROS ABS: 10.9 10*3/uL — AB (ref 1.7–7.7)
NEUTROS PCT: 70 % (ref 43–77)
Platelets: 386 10*3/uL (ref 150–400)
RBC: 1.99 MIL/uL — AB (ref 4.22–5.81)
RDW: 20.2 % — ABNORMAL HIGH (ref 11.5–15.5)
WBC: 15.5 10*3/uL — ABNORMAL HIGH (ref 4.0–10.5)

## 2014-03-09 MED ORDER — HEPARIN SOD (PORK) LOCK FLUSH 100 UNIT/ML IV SOLN
500.0000 [IU] | INTRAVENOUS | Status: AC | PRN
  Administered 2014-03-09: 500 [IU]

## 2014-03-09 NOTE — Discharge Summary (Addendum)
Physician Discharge Summary  Patient ID: [name] MRN: [id number] DOB/AGE: [birth year] 34 y.o.  Admit date: 03/06/2014 Discharge date: 03/09/2014  Admission Diagnoses:  Discharge Diagnoses:  Principal Problem:   Hb-SS disease with crisis Active Problems:   Hx of pulmonary embolus   Pulmonary HTN   Chronic anticoagulation   Discharged Condition: good  Hospital Course: Patient admitted with sickle cell painful crisis. Was treated with Dilaudid PCA, Toradol and IVF. Was doing better at discharge with pain level decreasing from 8/10 to 4/10. He was discharged on his home regimen to follow up with his primary care physician in 1-2 weeks. Also to continue with his home medications.   Consults: None  Significant Diagnostic Studies: labs: Serial CBCs and CMP is checked. Slight decrease in his hemoglobin but within the normal range.  Treatments: IV hydration and analgesia: acetaminophen and Dilaudid  Discharge Exam: Blood pressure 126/81, pulse 78, temperature 98.2 F (36.8 C), temperature source Oral, resp. rate 16, height 6' (1.829 m), weight 73.936 kg (163 lb), SpO2 94 %. General appearance: alert, cooperative and no distress Eyes: conjunctivae/corneas clear. PERRL, EOM's intact. Fundi benign. Throat: lips, mucosa, and tongue normal; teeth and gums normal Neck: no adenopathy, no carotid bruit, no JVD, supple, symmetrical, trachea midline and thyroid not enlarged, symmetric, no tenderness/mass/nodules Back: symmetric, no curvature. ROM normal. No CVA tenderness. Resp: clear to auscultation bilaterally Chest wall: no tenderness Cardio: regular rate and rhythm, S1, S2 normal, no murmur, click, rub or gallop GI: soft, non-tender; bowel sounds normal; no masses,  no organomegaly Extremities: extremities normal, atraumatic, no cyanosis or edema Pulses: 2+ and symmetric Skin: Skin color, texture, turgor normal. No rashes or lesions Neurologic: Grossly normal  Disposition: 01-Home  or Self Care     Medication List    TAKE these medications        folic acid 1 MG tablet  Commonly known as:  FOLVITE  Take 1 tablet (1 mg total) by mouth every morning.     HYDROmorphone 4 MG tablet  Commonly known as:  DILAUDID  Take 1 tablet (4 mg total) by mouth every 4 (four) hours as needed for severe pain.     hydroxyurea 500 MG capsule  Commonly known as:  HYDREA  Take 3 capsules (1,500 mg total) by mouth daily. May take with food to minimize GI side effects.     lisinopril 10 MG tablet  Commonly known as:  PRINIVIL,ZESTRIL  Take 1 tablet (10 mg total) by mouth daily.     metoprolol tartrate 25 MG tablet  Commonly known as:  LOPRESSOR  Take 1 tablet (25 mg total) by mouth daily.     morphine 30 MG 12 hr tablet  Commonly known as:  MS CONTIN  Take 1 tablet (30 mg total) by mouth every 12 (twelve) hours.     potassium chloride SA 20 MEQ tablet  Commonly known as:  K-DUR,KLOR-CON  Take 1 tablet (20 mEq total) by mouth every morning.     rivaroxaban 20 MG Tabs tablet  Commonly known as:  XARELTO  Take 1 tablet (20 mg total) by mouth every morning.     Vitamin D 2000 UNITS tablet  Take 1 tablet (2,000 Units total) by mouth daily.     zolpidem 10 MG tablet  Commonly known as:  AMBIEN  Take 1 tablet (10 mg total) by mouth at bedtime as needed for sleep.         SignedBarbette Merino 03/09/2014, 11:15 AM  Time spent  32 minutes

## 2014-03-09 NOTE — Progress Notes (Signed)
Wasted Dilaudid PCA 12cc/24mg  in sink witnessed by [employer].

## 2014-03-10 IMAGING — CR DG CHEST 1V PORT
1 series · 1 of 1 positions shown · non-contrast
Comparison: Plain films 04/17/2011

CLINICAL DATA: PICC line placement

PORTABLE CHEST - 1 VIEW

[AP]
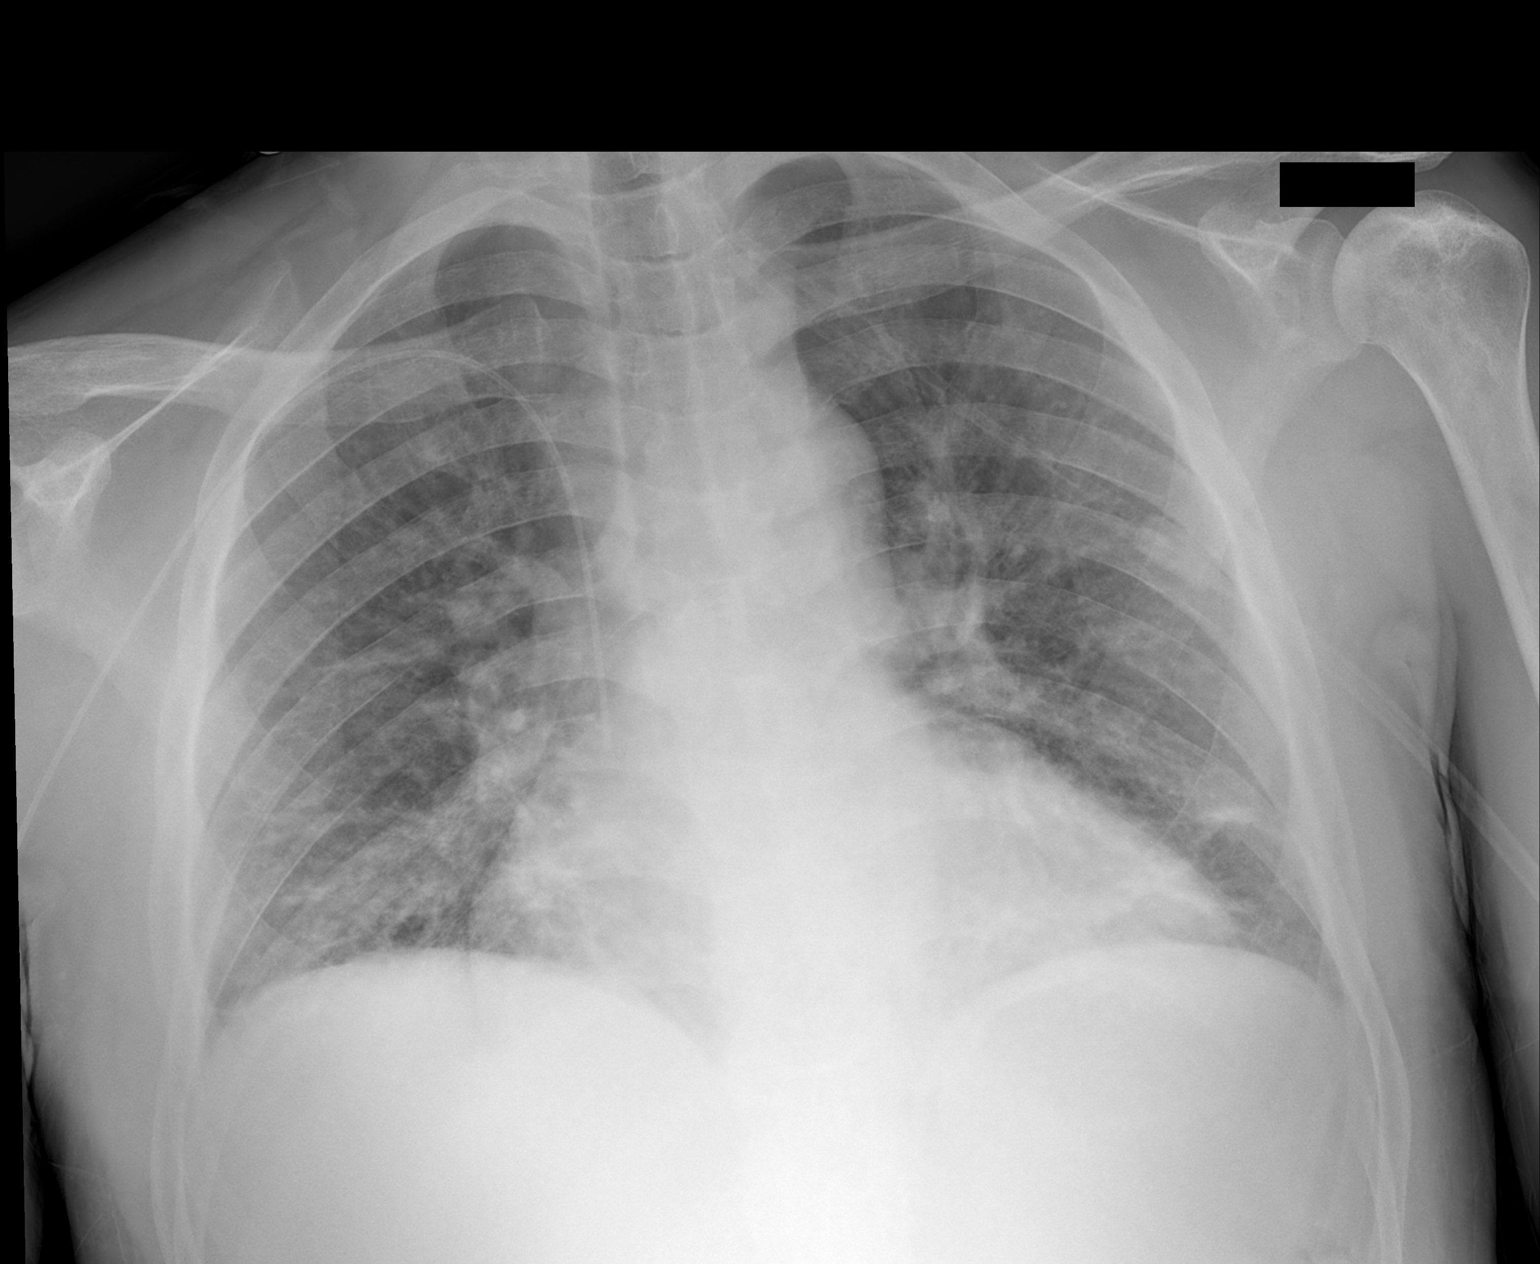

[1 of 1 positions shown; findings below may reference images not displayed]

FINDINGS: Interval placement right PICC line tip in distal SVC.
Heart is mildly enlarged.  There is mild central venous congestion.
No focal consolidation.  No pneumothorax.
IMPRESSION: Interval placement right PICC line good position.

Central venous congestion.

## 2014-03-10 IMAGING — CR DG CHEST 2V
2 series · 2 of 2 positions shown · non-contrast
Comparison: Chest radiograph 04/13/2011

CLINICAL DATA: Sickle cell crisis

CHEST - 2 VIEW

[w chest pa]
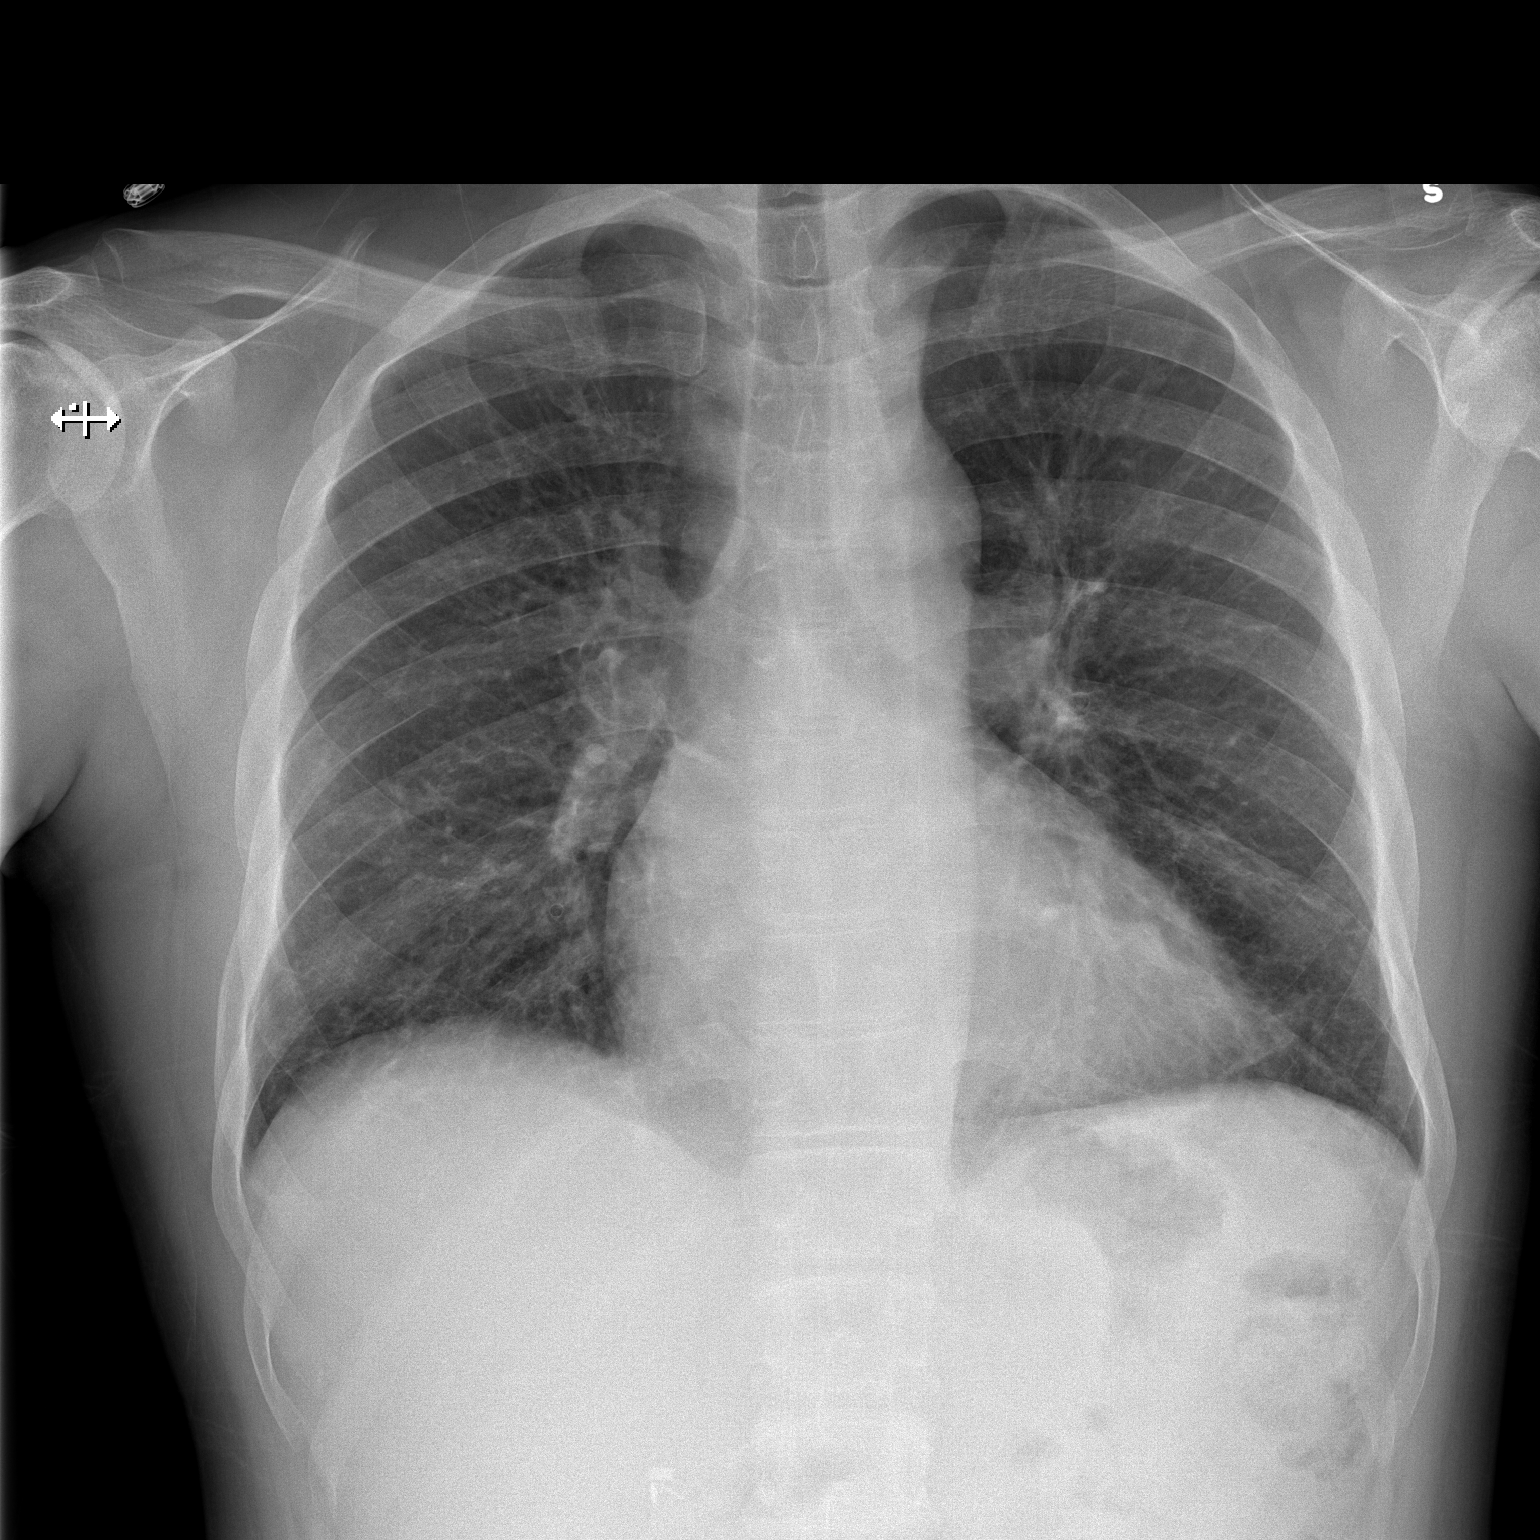

[w chest lat]
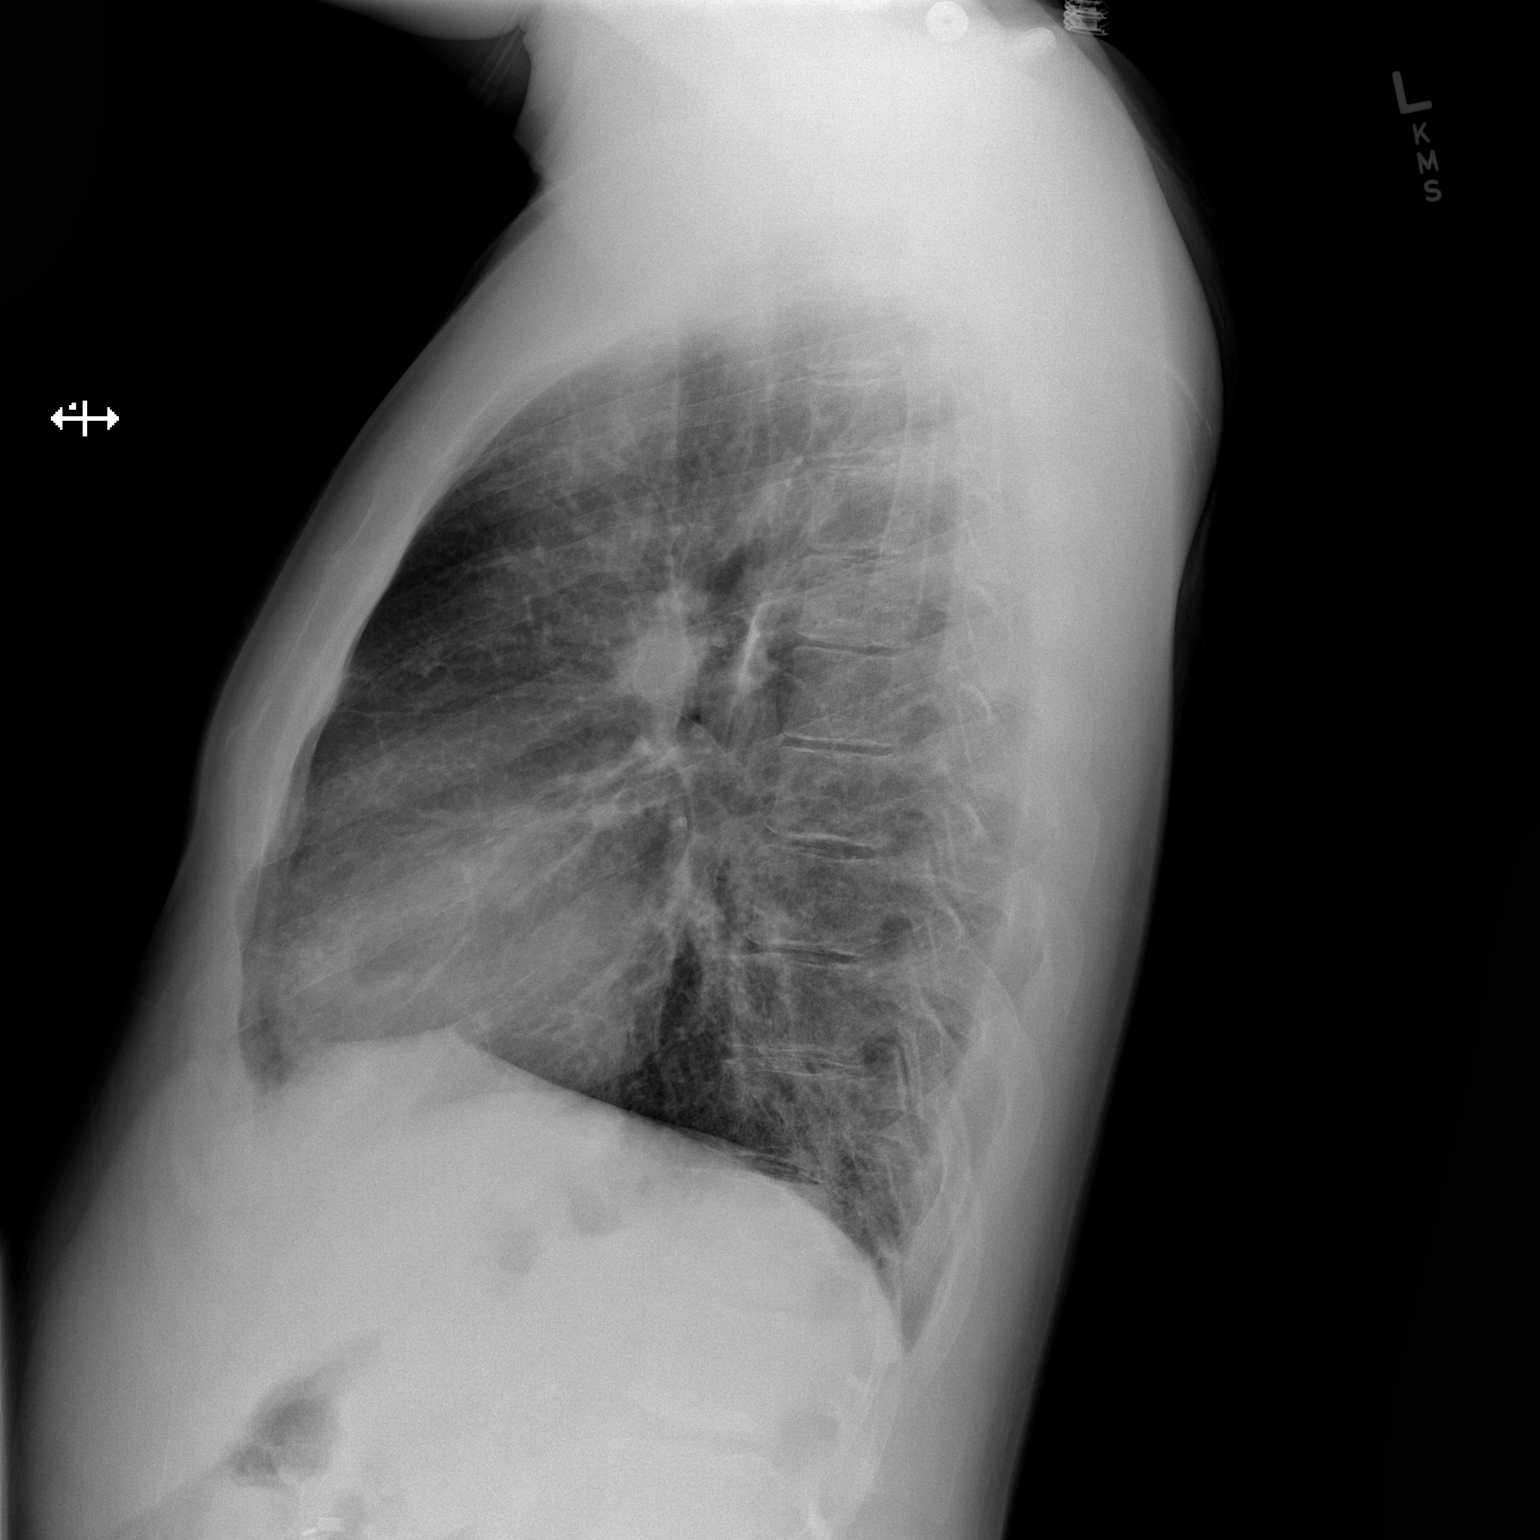

[2 of 2 positions shown; findings below may reference images not displayed]

FINDINGS: Stable cardiac silhouette.  There is no focal
consolidation.  No pleural fluid.  There is a chronic bronchitic
markings centrally unchanged from prior.  No osseous abnormality.
Mild atelectasis at the right lung base is not changed.
IMPRESSION: 1..  No interval change.
2.  No evidence of pneumonia.
3.  chronic bronchitic change in atelectasis.

## 2014-03-11 ENCOUNTER — Inpatient Hospital Stay (HOSPITAL_COMMUNITY)
Admission: EM | Admit: 2014-03-11 | Discharge: 2014-03-13 | DRG: 175 | Disposition: A | Discharge status: Home / Self Care | Payer: Medicare Other | Attending: Internal Medicine | Admitting: Internal Medicine

## 2014-03-11 ENCOUNTER — Emergency Department (HOSPITAL_COMMUNITY): Payer: Medicare Other

## 2014-03-11 ENCOUNTER — Encounter (HOSPITAL_COMMUNITY): Payer: Self-pay | Admitting: Emergency Medicine

## 2014-03-11 ENCOUNTER — Inpatient Hospital Stay (HOSPITAL_COMMUNITY): Payer: Medicare Other

## 2014-03-11 DIAGNOSIS — R072 Precordial pain: Secondary | ICD-10-CM | POA: Diagnosis not present

## 2014-03-11 DIAGNOSIS — R778 Other specified abnormalities of plasma proteins: Secondary | ICD-10-CM

## 2014-03-11 DIAGNOSIS — I272 Other secondary pulmonary hypertension: Secondary | ICD-10-CM | POA: Diagnosis present

## 2014-03-11 DIAGNOSIS — R0602 Shortness of breath: Secondary | ICD-10-CM

## 2014-03-11 DIAGNOSIS — R7989 Other specified abnormal findings of blood chemistry: Secondary | ICD-10-CM | POA: Diagnosis not present

## 2014-03-11 DIAGNOSIS — Z87891 Personal history of nicotine dependence: Secondary | ICD-10-CM

## 2014-03-11 DIAGNOSIS — I4891 Unspecified atrial fibrillation: Secondary | ICD-10-CM | POA: Diagnosis present

## 2014-03-11 DIAGNOSIS — R0902 Hypoxemia: Secondary | ICD-10-CM | POA: Diagnosis not present

## 2014-03-11 DIAGNOSIS — D589 Hereditary hemolytic anemia, unspecified: Secondary | ICD-10-CM | POA: Diagnosis not present

## 2014-03-11 DIAGNOSIS — Z96641 Presence of right artificial hip joint: Secondary | ICD-10-CM | POA: Diagnosis present

## 2014-03-11 DIAGNOSIS — I248 Other forms of acute ischemic heart disease: Secondary | ICD-10-CM | POA: Diagnosis present

## 2014-03-11 DIAGNOSIS — D72829 Elevated white blood cell count, unspecified: Secondary | ICD-10-CM | POA: Diagnosis not present

## 2014-03-11 DIAGNOSIS — D5700 Hb-SS disease with crisis, unspecified: Secondary | ICD-10-CM | POA: Diagnosis present

## 2014-03-11 DIAGNOSIS — F1290 Cannabis use, unspecified, uncomplicated: Secondary | ICD-10-CM | POA: Diagnosis present

## 2014-03-11 DIAGNOSIS — D473 Essential (hemorrhagic) thrombocythemia: Secondary | ICD-10-CM | POA: Diagnosis present

## 2014-03-11 DIAGNOSIS — E876 Hypokalemia: Secondary | ICD-10-CM | POA: Diagnosis present

## 2014-03-11 DIAGNOSIS — Z7901 Long term (current) use of anticoagulants: Secondary | ICD-10-CM

## 2014-03-11 DIAGNOSIS — I2699 Other pulmonary embolism without acute cor pulmonale: Principal | ICD-10-CM | POA: Diagnosis present

## 2014-03-11 DIAGNOSIS — F39 Unspecified mood [affective] disorder: Secondary | ICD-10-CM | POA: Diagnosis present

## 2014-03-11 DIAGNOSIS — I2782 Chronic pulmonary embolism: Secondary | ICD-10-CM | POA: Diagnosis present

## 2014-03-11 DIAGNOSIS — Q8901 Asplenia (congenital): Secondary | ICD-10-CM

## 2014-03-11 DIAGNOSIS — I10 Essential (primary) hypertension: Secondary | ICD-10-CM | POA: Diagnosis not present

## 2014-03-11 DIAGNOSIS — Z9114 Patient's other noncompliance with medication regimen: Secondary | ICD-10-CM | POA: Diagnosis present

## 2014-03-11 DIAGNOSIS — I517 Cardiomegaly: Secondary | ICD-10-CM | POA: Diagnosis not present

## 2014-03-11 DIAGNOSIS — J9811 Atelectasis: Secondary | ICD-10-CM | POA: Diagnosis not present

## 2014-03-11 DIAGNOSIS — Z79899 Other long term (current) drug therapy: Secondary | ICD-10-CM

## 2014-03-11 DIAGNOSIS — Z9049 Acquired absence of other specified parts of digestive tract: Secondary | ICD-10-CM | POA: Diagnosis present

## 2014-03-11 DIAGNOSIS — R748 Abnormal levels of other serum enzymes: Secondary | ICD-10-CM | POA: Diagnosis not present

## 2014-03-11 DIAGNOSIS — G894 Chronic pain syndrome: Secondary | ICD-10-CM | POA: Diagnosis not present

## 2014-03-11 DIAGNOSIS — R6889 Other general symptoms and signs: Secondary | ICD-10-CM | POA: Diagnosis not present

## 2014-03-11 DIAGNOSIS — Z79891 Long term (current) use of opiate analgesic: Secondary | ICD-10-CM | POA: Diagnosis not present

## 2014-03-11 DIAGNOSIS — D649 Anemia, unspecified: Secondary | ICD-10-CM | POA: Diagnosis present

## 2014-03-11 DIAGNOSIS — Z86711 Personal history of pulmonary embolism: Secondary | ICD-10-CM | POA: Diagnosis present

## 2014-03-11 LAB — COMPREHENSIVE METABOLIC PANEL
ALT: 26 U/L (ref 0–53)
AST: 44 U/L — ABNORMAL HIGH (ref 0–37)
Albumin: 3.8 g/dL (ref 3.5–5.2)
Alkaline Phosphatase: 93 U/L (ref 39–117)
Anion gap: 7 (ref 5–15)
BUN: 5 mg/dL — ABNORMAL LOW (ref 6–23)
CO2: 22 mmol/L (ref 19–32)
Calcium: 8.9 mg/dL (ref 8.4–10.5)
Chloride: 108 mmol/L (ref 96–112)
Creatinine, Ser: 0.51 mg/dL (ref 0.50–1.35)
GFR calc Af Amer: >90 mL/min (ref >90)
GFR calc non Af Amer: >90 mL/min (ref >90)
Glucose, Bld: 121 mg/dL — ABNORMAL HIGH (ref 70–99)
Potassium: 3.1 mmol/L — ABNORMAL LOW (ref 3.5–5.1)
Sodium: 137 mmol/L (ref 135–145)
Total Bilirubin: 9.4 mg/dL — ABNORMAL HIGH (ref 0.3–1.2)
Total Protein: 7.3 g/dL (ref 6.0–8.3)

## 2014-03-11 LAB — CBC WITH DIFFERENTIAL/PLATELET
Basophils Absolute: 0 10*3/uL (ref 0.0–0.1)
Basophils Relative: 0 % (ref 0–1)
Eosinophils Absolute: 0 10*3/uL (ref 0.0–0.7)
Eosinophils Relative: 0 % (ref 0–5)
HCT: 22.7 % — ABNORMAL LOW (ref 39.0–52.0)
Hemoglobin: 8.1 g/dL — ABNORMAL LOW (ref 13.0–17.0)
Lymphocytes Relative: 6 % — ABNORMAL LOW (ref 12–46)
Lymphs Abs: 1.4 10*3/uL (ref 0.7–4.0)
MCH: 33.2 pg (ref 26.0–34.0)
MCHC: 35.7 g/dL (ref 30.0–36.0)
MCV: 93 fL (ref 78.0–100.0)
Monocytes Absolute: 2.6 10*3/uL — ABNORMAL HIGH (ref 0.1–1.0)
Monocytes Relative: 11 % (ref 3–12)
Neutro Abs: 19.9 10*3/uL — ABNORMAL HIGH (ref 1.7–7.7)
Neutrophils Relative %: 83 % — ABNORMAL HIGH (ref 43–77)
Platelets: 411 10*3/uL — ABNORMAL HIGH (ref 150–400)
RBC: 2.44 MIL/uL — ABNORMAL LOW (ref 4.22–5.81)
RDW: 23.7 % — ABNORMAL HIGH (ref 11.5–15.5)
WBC: 23.9 10*3/uL — ABNORMAL HIGH (ref 4.0–10.5)
nRBC: 10 /100 WBC — ABNORMAL HIGH

## 2014-03-11 LAB — RETICULOCYTES
RBC.: 2.44 MIL/uL — ABNORMAL LOW (ref 4.22–5.81)
Retic Ct Pct: >23 % — ABNORMAL HIGH (ref 0.4–3.1)

## 2014-03-11 LAB — TROPONIN I: Troponin I: 1.26 ng/mL (ref <0.031)

## 2014-03-11 MED ORDER — RIVAROXABAN 20 MG PO TABS
20.0000 mg | ORAL_TABLET | Freq: Every day | ORAL | Status: DC
  Administered 2014-03-11: 20 mg via ORAL
  Filled 2014-03-11: qty 1

## 2014-03-11 MED ORDER — ONDANSETRON HCL 4 MG PO TABS: 4.0000 mg | ORAL_TABLET | Freq: Four times a day (QID) | ORAL | Status: DC | PRN

## 2014-03-11 MED ORDER — DILTIAZEM LOAD VIA INFUSION
10.0000 mg | Freq: Once | INTRAVENOUS | Status: DC
  Filled 2014-03-11: qty 10

## 2014-03-11 MED ORDER — HYDROMORPHONE HCL 2 MG/ML IJ SOLN
2.0000 mg | Freq: Once | INTRAMUSCULAR | Status: AC
  Administered 2014-03-11: 2 mg via INTRAVENOUS
  Filled 2014-03-11: qty 1

## 2014-03-11 MED ORDER — POTASSIUM CHLORIDE CRYS ER 20 MEQ PO TBCR
40.0000 meq | EXTENDED_RELEASE_TABLET | Freq: Once | ORAL | Status: AC
  Administered 2014-03-11: 40 meq via ORAL
  Filled 2014-03-11: qty 2

## 2014-03-11 MED ORDER — POLYETHYLENE GLYCOL 3350 17 G PO PACK: 17.0000 g | PACK | Freq: Every day | ORAL | Status: DC | PRN

## 2014-03-11 MED ORDER — KETOROLAC TROMETHAMINE 15 MG/ML IJ SOLN
15.0000 mg | Freq: Once | INTRAMUSCULAR | Status: AC
  Administered 2014-03-11: 15 mg via INTRAVENOUS
  Filled 2014-03-11: qty 1

## 2014-03-11 MED ORDER — FOLIC ACID 1 MG PO TABS
1.0000 mg | ORAL_TABLET | Freq: Every morning | ORAL | Status: DC
  Administered 2014-03-11 – 2014-03-13 (×3): 1 mg via ORAL
  Filled 2014-03-11 (×3): qty 1

## 2014-03-11 MED ORDER — METOPROLOL TARTRATE 25 MG PO TABS
25.0000 mg | ORAL_TABLET | Freq: Every day | ORAL | Status: DC
  Administered 2014-03-11 – 2014-03-13 (×3): 25 mg via ORAL
  Filled 2014-03-11 (×3): qty 1

## 2014-03-11 MED ORDER — HYDROMORPHONE HCL 2 MG/ML IJ SOLN
2.0000 mg | INTRAMUSCULAR | Status: AC
  Administered 2014-03-11 (×3): 2 mg via INTRAVENOUS
  Filled 2014-03-11 (×2): qty 1

## 2014-03-11 MED ORDER — DILTIAZEM HCL 100 MG IV SOLR: 5.0000 mg/h | INTRAVENOUS | Status: DC

## 2014-03-11 MED ORDER — NALOXONE HCL 0.4 MG/ML IJ SOLN: 0.4000 mg | INTRAMUSCULAR | Status: DC | PRN

## 2014-03-11 MED ORDER — DIPHENHYDRAMINE HCL 50 MG/ML IJ SOLN
25.0000 mg | Freq: Once | INTRAMUSCULAR | Status: AC
  Administered 2014-03-11: 25 mg via INTRAVENOUS
  Filled 2014-03-11: qty 1

## 2014-03-11 MED ORDER — DILTIAZEM LOAD VIA INFUSION
10.0000 mg | Freq: Once | INTRAVENOUS | Status: AC
  Administered 2014-03-11: 10 mg via INTRAVENOUS
  Filled 2014-03-11: qty 10

## 2014-03-11 MED ORDER — ONDANSETRON HCL 4 MG/2ML IJ SOLN: 4.0000 mg | Freq: Four times a day (QID) | INTRAMUSCULAR | Status: DC | PRN

## 2014-03-11 MED ORDER — DIPHENHYDRAMINE HCL 50 MG/ML IJ SOLN
12.5000 mg | Freq: Four times a day (QID) | INTRAMUSCULAR | Status: DC | PRN
  Administered 2014-03-12 – 2014-03-13 (×2): 12.5 mg via INTRAVENOUS
  Filled 2014-03-11 (×2): qty 1

## 2014-03-11 MED ORDER — HYDROMORPHONE 0.3 MG/ML IV SOLN
INTRAVENOUS | Status: DC
  Administered 2014-03-11: 20:00:00 via INTRAVENOUS
  Administered 2014-03-11: 0.999 mg via INTRAVENOUS
  Administered 2014-03-11: 4.49 mg via INTRAVENOUS
  Administered 2014-03-11: 1.99 mg via INTRAVENOUS
  Administered 2014-03-11: 13:00:00 via INTRAVENOUS
  Administered 2014-03-12: 1.75 mg via INTRAVENOUS
  Administered 2014-03-12: 2.18 mg via INTRAVENOUS
  Administered 2014-03-12: 1.5 mg via INTRAVENOUS
  Administered 2014-03-12: 0 mg via INTRAVENOUS
  Administered 2014-03-12: 3.49 mg via INTRAVENOUS
  Administered 2014-03-13: 2.7 mg via INTRAVENOUS
  Administered 2014-03-13: via INTRAVENOUS
  Administered 2014-03-13: 2.99 mg via INTRAVENOUS
  Administered 2014-03-13: 1.6 mg via INTRAVENOUS
  Filled 2014-03-11 (×4): qty 25

## 2014-03-11 MED ORDER — HEPARIN (PORCINE) IN NACL 100-0.45 UNIT/ML-% IJ SOLN
1500.0000 [IU]/h | INTRAMUSCULAR | Status: DC
  Administered 2014-03-12: 1500 [IU]/h via INTRAVENOUS
  Filled 2014-03-11: qty 250

## 2014-03-11 MED ORDER — LISINOPRIL 10 MG PO TABS
10.0000 mg | ORAL_TABLET | Freq: Every day | ORAL | Status: DC
  Administered 2014-03-11 – 2014-03-13 (×3): 10 mg via ORAL
  Filled 2014-03-11 (×3): qty 1

## 2014-03-11 MED ORDER — HYDROMORPHONE HCL 1 MG/ML IJ SOLN
1.0000 mg | INTRAMUSCULAR | Status: DC | PRN
  Administered 2014-03-11: 1 mg via INTRAVENOUS
  Filled 2014-03-11: qty 1

## 2014-03-11 MED ORDER — DILTIAZEM HCL 100 MG IV SOLR
5.0000 mg/h | INTRAVENOUS | Status: DC
  Administered 2014-03-11 – 2014-03-12 (×2): 5 mg/h via INTRAVENOUS
  Filled 2014-03-11: qty 100

## 2014-03-11 MED ORDER — DIPHENHYDRAMINE HCL 12.5 MG/5ML PO ELIX
12.5000 mg | ORAL_SOLUTION | Freq: Four times a day (QID) | ORAL | Status: DC | PRN
  Administered 2014-03-11 – 2014-03-12 (×3): 12.5 mg via ORAL
  Filled 2014-03-11 (×3): qty 5

## 2014-03-11 MED ORDER — SODIUM CHLORIDE 0.9 % IJ SOLN: 9.0000 mL | INTRAMUSCULAR | Status: DC | PRN

## 2014-03-11 MED ORDER — HYDROMORPHONE HCL 2 MG/ML IJ SOLN: 2.0000 mg | INTRAMUSCULAR | Status: DC

## 2014-03-11 MED ORDER — MORPHINE SULFATE ER 30 MG PO TBCR
30.0000 mg | EXTENDED_RELEASE_TABLET | Freq: Two times a day (BID) | ORAL | Status: DC
  Administered 2014-03-11 – 2014-03-13 (×4): 30 mg via ORAL
  Filled 2014-03-11 (×4): qty 1

## 2014-03-11 MED ORDER — SODIUM CHLORIDE 0.9 % IJ SOLN: 3.0000 mL | Freq: Two times a day (BID) | INTRAMUSCULAR | Status: DC

## 2014-03-11 MED ORDER — MORPHINE SULFATE ER 30 MG PO TBCR
30.0000 mg | EXTENDED_RELEASE_TABLET | Freq: Two times a day (BID) | ORAL | Status: DC
  Administered 2014-03-11: 30 mg via ORAL
  Filled 2014-03-11: qty 1

## 2014-03-11 MED ORDER — ZOLPIDEM TARTRATE 10 MG PO TABS
10.0000 mg | ORAL_TABLET | Freq: Every evening | ORAL | Status: DC | PRN
  Administered 2014-03-12: 10 mg via ORAL
  Filled 2014-03-11: qty 1

## 2014-03-11 MED ORDER — IOHEXOL 350 MG/ML SOLN
100.0000 mL | Freq: Once | INTRAVENOUS | Status: AC | PRN
  Administered 2014-03-11: 100 mL via INTRAVENOUS

## 2014-03-11 MED ORDER — SODIUM CHLORIDE 0.45 % IV SOLN
INTRAVENOUS | Status: DC
  Administered 2014-03-11 – 2014-03-13 (×4): via INTRAVENOUS

## 2014-03-11 MED ORDER — HYDROXYUREA 500 MG PO CAPS
1500.0000 mg | ORAL_CAPSULE | Freq: Every day | ORAL | Status: DC
  Administered 2014-03-11 – 2014-03-13 (×3): 1500 mg via ORAL
  Filled 2014-03-11 (×3): qty 3

## 2014-03-11 MED ORDER — POTASSIUM CHLORIDE CRYS ER 20 MEQ PO TBCR
20.0000 meq | EXTENDED_RELEASE_TABLET | Freq: Every morning | ORAL | Status: DC
  Administered 2014-03-11 – 2014-03-13 (×3): 20 meq via ORAL
  Filled 2014-03-11 (×3): qty 1

## 2014-03-11 MED ORDER — HYDROMORPHONE HCL 2 MG/ML IJ SOLN
2.0000 mg | INTRAMUSCULAR | Status: DC | PRN
  Administered 2014-03-11 – 2014-03-13 (×17): 2 mg via INTRAVENOUS
  Filled 2014-03-11 (×18): qty 1

## 2014-03-11 NOTE — ED Notes (Signed)
Pt BIB [facility], c/o [facility] "all over." Pt reported that he awoke at 0330 and fell down to his knees upon attempting to walk to the BR "because I was in so much pain." Pt was able to walk down from second floor apt to EMS without difficulty. Denies any injury. Pt recently admitted from 2/4 until 2/7.

## 2014-03-11 NOTE — ED Notes (Signed)
EKG given to EDP,Kohut,MD., for review. 

## 2014-03-11 NOTE — ED Notes (Signed)
Bed: [facility]85 Expected date:  Expected time:  Means of arrival:  Comments: EMS 34 yo male sickle cell crisis

## 2014-03-11 NOTE — Progress Notes (Signed)
BP 114/72 mmHg  Pulse 99  Temp(Src) 98.3 F (36.8 C) (Oral)  Resp 18  Ht 6' (1.829 m)  Wt 73 kg (160 lb 15 oz)  BMI 21.82 kg/m2  SpO2 99% - Start heparin tonight. Will need cont heparin until pt stabilizes. - pt admits to missing doses of Xarelto. - pain not controlled, give loading does of dilaudid.

## 2014-03-11 NOTE — ED Provider Notes (Signed)
CSN: [id number]     Arrival date & time 03/11/14  0109 History   First MD Initiated Contact with Patient 03/11/14 0448     Chief Complaint  Patient presents with  . Sickle Cell Pain Crisis     (Consider location/radiation/quality/duration/timing/severity/associated sxs/prior Treatment) HPI   33yM with L arm and L chest pain. Hx of sickle cell anemia. Worsening pain yesterday but manageable with home meds. Woke up from sleep shortly before arrival with more severe pain. Initially felt like could not move L arm. He is not sure if this was from pain or if perhaps slept awkwardly.  Still having pain but no difficulty moving or abnormal sensation. Mild SOB. No fever or chills. No n/v. Hx of PE on xarelto. Reports compliance.   Past Medical History  Diagnosis Date  . Sickle cell anemia   . Blood transfusion   . Acute embolism and thrombosis of right internal jugular vein   . Hypokalemia   . Mood disorder   . History of pulmonary embolus (PE)   . Avascular necrosis   . Leukocytosis     Chronic  . Thrombocytosis     Chronic  . Hypertension   . History of Clostridium difficile infection   . Uses marijuana   . Chronic anticoagulation   . Functional asplenia   . Former smoker   . Second hand tobacco smoke exposure   . Alcohol consumption of one to four drinks per day   . Noncompliance with medication regimen   . Sickle-cell crisis with associated acute chest syndrome 05/13/2013  . Acute chest syndrome 06/18/2013  . Demand ischemia 01/02/2014   Past Surgical History  Procedure Laterality Date  . Right hip replacement      08/2006  . Cholecystectomy      01/2008  . Porta cath placement    . Porta cath removal    . Umbilical hernia repair      01/2008  . Excision of left periauricular cyst      10/2009  . Excision of right ear lobe cyst with primary closur      11/2007  . Portacath placement  01/05/2012    Procedure: INSERTION PORT-A-CATH;  Surgeon: Odis Hollingshead, MD;  Location:  [place];  Service: General;  Laterality: N/A;  ultrasound guiced port a cath insertion with fluoroscopy   Family History  Problem Relation Age of Onset  . Sickle cell trait Mother   . Depression Mother   . Diabetes Mother   . Sickle cell trait Father   . Sickle cell trait Brother    History  Substance Use Topics  . Smoking status: Former Smoker -- 13 years    Quit date: 07/08/2010  . Smokeless tobacco: Never Used  . Alcohol Use: 1.2 oz/week    2 Shots of liquor per week    Review of Systems  All systems reviewed and negative, other than as noted in HPI.   Allergies  Review of patient's allergies indicates no known allergies.  Home Medications   Prior to Admission medications   Medication Sig Start Date End Date Taking? Authorizing Provider  Cholecalciferol (VITAMIN D) 2000 UNITS tablet Take 1 tablet (2,000 Units total) by mouth daily. 02/13/14  Yes [name], MD  folic acid (FOLVITE) 1 MG tablet Take 1 tablet (1 mg total) by mouth every morning. 10/08/13  Yes [name], MD  HYDROmorphone (DILAUDID) 4 MG tablet Take 1 tablet (4 mg total) by mouth every 4 (four)  hours as needed for severe pain. 02/11/14  Yes [name], MD  hydroxyurea (HYDREA) 500 MG capsule Take 3 capsules (1,500 mg total) by mouth daily. May take with food to minimize GI side effects. 10/30/13  Yes [name], MD  lisinopril (PRINIVIL,ZESTRIL) 10 MG tablet Take 1 tablet (10 mg total) by mouth daily. 01/22/14  Yes [name], MD  metoprolol tartrate (LOPRESSOR) 25 MG tablet Take 1 tablet (25 mg total) by mouth daily. 08/22/13  Yes [name], MD  morphine (MS CONTIN) 30 MG 12 hr tablet Take 1 tablet (30 mg total) by mouth every 12 (twelve) hours. 02/26/14  Yes [name], MD  potassium chloride SA (K-DUR,KLOR-CON) 20 MEQ tablet Take 1 tablet (20 mEq total) by mouth every morning. 09/25/13  Yes [name], MD  rivaroxaban (XARELTO) 20 MG TABS tablet  Take 1 tablet (20 mg total) by mouth every morning. 01/06/14  Yes [name], MD  zolpidem (AMBIEN) 10 MG tablet Take 1 tablet (10 mg total) by mouth at bedtime as needed for sleep. 10/28/13  Yes Dorena Dew, FNP   BP 125/82 mmHg  Pulse 104  Temp(Src) 98.2 F (36.8 C) (Oral)  Resp 20  SpO2 88% Physical Exam  Constitutional: He is oriented to person, place, and time. He appears well-developed and well-nourished. No distress.  HENT:  Head: Normocephalic and atraumatic.  Eyes: Conjunctivae are normal. Pupils are equal, round, and reactive to light. Right eye exhibits no discharge. Left eye exhibits no discharge.  Neck: Neck supple.  Cardiovascular: Normal rate, regular rhythm and normal heart sounds.  Exam reveals no gallop and no friction rub.   No murmur heard. Pulmonary/Chest: Effort normal and breath sounds normal. No respiratory distress.  Port L chest. Pain not reproducible with palpation.   Abdominal: Soft. He exhibits no distension. There is no tenderness.  Musculoskeletal: He exhibits no edema or tenderness.  Lower extremities symmetric as compared to each other. No calf tenderness. Negative Homan's. No palpable cords.   Neurological: He is alert and oriented to person, place, and time. No cranial nerve deficit. He exhibits normal muscle tone. Coordination normal.  Skin: Skin is warm and dry. He is not diaphoretic.  Psychiatric: He has a normal mood and affect. His behavior is normal. Thought content normal.  Nursing note and vitals reviewed.   ED Course  Procedures (including critical care time) Labs Review Labs Reviewed  RETICULOCYTES - Abnormal; Notable for the following:    Retic Ct Pct >23.0 (*)    RBC. 2.44 (*)    All other components within normal limits  CBC WITH DIFFERENTIAL/PLATELET - Abnormal; Notable for the following:    WBC 23.9 (*)    RBC 2.44 (*)    Hemoglobin 8.1 (*)    HCT 22.7 (*)    RDW 23.7 (*)    Platelets 411 (*)    Neutrophils  Relative % 83 (*)    Lymphocytes Relative 6 (*)    nRBC 10 (*)    Neutro Abs 19.9 (*)    Monocytes Absolute 2.6 (*)    All other components within normal limits  COMPREHENSIVE METABOLIC PANEL - Abnormal; Notable for the following:    Potassium 3.1 (*)    Glucose, Bld 121 (*)    BUN 5 (*)    AST 44 (*)    Total Bilirubin 9.4 (*)    All other components within normal limits  TROPONIN I    Imaging Review  Dg Chest 2 View  03/11/2014   CLINICAL DATA:  Acute onset of shortness of breath. Decreased O2 saturation. Generalized weakness and pain. Initial encounter.  EXAM: CHEST  2 VIEW  COMPARISON:  Chest radiograph performed 03/06/2014  FINDINGS: The lungs are well-aerated. Vascular congestion is noted. Minimal bilateral atelectasis is seen. There is no evidence of pleural effusion or pneumothorax.  The heart is mildly enlarged. A left-sided chest port is noted ending about the cavoatrial junction. No acute osseous abnormalities are seen.  IMPRESSION: Vascular congestion and mild cardiomegaly. Minimal bilateral atelectasis seen.   Electronically Signed   By: Garald Balding M.D.   On: 03/11/2014 06:24     EKG Interpretation None       EKG:  Rhythm: normal sinus Rate: 94 PR: 156 ms QRS: 113 ms QTc: 498 ms RVH LAE LAFB TWI in precordial leads and inferiorly. Last EKG with TWI v1-5. TWI v6 and inferiorly changed from last EKG but seen on other previous EKGs.    MDM   Final diagnoses:  SOB (shortness of breath)  Sickle-cell crisis  Elevated troponin    33yM with L arm/chest pain. Similar to symptoms with prior vaso-occlusive crises. Hypoxemia on presentation today, but in reviewing records, he often is. Hx of PE. Reports compliance with xarelto. Abnormal EKG. Anterior TWI noted on previous. TWI in V6 and inferiorly on today's EKG not on last one. Was noted though when reviewing others.  CXR does not look acutely changed. No adventitious breath sounds appreciated. Troponin is elevated.  Has been on several different evaluations. Consultation by cardiology during admit this past December. Felt that demand ischemia secondary to anemia, hypoxemia (and at that time also PE). Reportedly prior issues with compliance. Telling me he is taking the xarelto. Aspirin deferred. Anemia near baseline. Chronic leukocytosis. Afebrile.  Pain poorly controlled after several doses of dilaudid as well and toradol. Will discuss with medicine.    Virgel Manifold, MD 03/11/14 [phone]

## 2014-03-11 NOTE — ED Notes (Signed)
Dr. Wilson Singer made aware of critical Troponin 1.26 and request for pain medication. Will make pt aware of admit status.

## 2014-03-11 NOTE — ED Notes (Addendum)
Pt. Oxygen level on room air 88%. RN,Mays notified and pt. Placed on 4 liters oxygen.

## 2014-03-11 NOTE — Care Management Note (Addendum)
    Page 1 of 1   03/13/2014     10:50:44 AM CARE MANAGEMENT NOTE 03/13/2014  Patient:  [name], [name]   Account Number:  [id number]  Date Initiated:  03/11/2014  Documentation initiated by:  Dessa Phi  Subjective/Objective Assessment:   34 y/o m admitted w/Afib w/rvr.QB:HALPFX cell crisis.Readmit 2/4-03/09/14.     Action/Plan:   From home.   Anticipated DC Date:  03/13/2014   Anticipated DC Plan:  [place]  CM consult      Choice offered to / List presented to:             Status of service:  Completed, signed off Medicare Important Message given?   (If response is "NO", the following Medicare IM given date fields will be blank) Date Medicare IM given:   Medicare IM given by:   Date Additional Medicare IM given:   Additional Medicare IM given by:    Discharge Disposition:  HOME/SELF CARE  Per UR Regulation:  Reviewed for med. necessity/level of care/duration of stay  If discussed at [place] of Stay Meetings, dates discussed:    Comments:  03/13/14 Dessa Phi RN BSN NCM [phone] D/C home no needs or orders.  03/11/14 Dessa Phi RN BSN NCM [phone] No anticipated d/c needs.

## 2014-03-11 NOTE — Progress Notes (Signed)
Assumed care of pt. No changes in intitial am assessment at this time. Cont with plan of care 

## 2014-03-11 NOTE — ED Notes (Signed)
PT REQUESTING PAIN MEDICATION. OXYGEN SATURATION SUPPORTS AT THIS TIME

## 2014-03-11 NOTE — Progress Notes (Signed)
ANTICOAGULATION CONSULT NOTE - Initial Consult  Pharmacy Consult for heparin Indication: PE   No Known Allergies  Patient Measurements: Height: 6' (182.9 cm) Weight: 160 lb 15 oz (73 kg) IBW/kg (Calculated) : 77.6   Vital Signs: Temp: 98.3 F (36.8 C) (02/09 1307) Temp Source: Oral (02/09 1307) BP: 114/72 mmHg (02/09 1307) Pulse Rate: 99 (02/09 1307)  Labs:  Recent Labs  03/09/14 0343 03/11/14 0500  HGB 6.9* 8.1*  HCT 18.8* 22.7*  PLT 386 411*  CREATININE 0.55 0.51  TROPONINI  --  1.26*    Estimated Creatinine Clearance: 134.3 mL/min (by C-G formula based on Cr of 0.51).   Medical History: Past Medical History  Diagnosis Date  . Sickle cell anemia   . Blood transfusion   . Acute embolism and thrombosis of right internal jugular vein   . Hypokalemia   . Mood disorder   . History of pulmonary embolus (PE)   . Avascular necrosis   . Leukocytosis     Chronic  . Thrombocytosis     Chronic  . Hypertension   . History of Clostridium difficile infection   . Uses marijuana   . Chronic anticoagulation   . Functional asplenia   . Former smoker   . Second hand tobacco smoke exposure   . Alcohol consumption of one to four drinks per day   . Noncompliance with medication regimen   . Sickle-cell crisis with associated acute chest syndrome 05/13/2013  . Acute chest syndrome 06/18/2013  . Demand ischemia 01/02/2014      Assessment: 34 y.o. male past medical history of sickle cell disease,  comes in for sudden onset of left-sided chest pain and arm with right leg pain that started on the day of admission about 2 AM in the morning. He relates it has progressively gotten worse so he came to the ED. He relates that his arm felt like he couldn't move it he is not sure whether it was from pain or he slept on it.  He relates no shortness of breath, fever, chills, nausea, vomiting or medication.  Pharmacy consulted to dose heparin for new PE.  -pt had been on Xarelto 20mg   daily but compliance is in question -CrCl >100 mls/min -last dose of xarelto 20mg  today @ 1300 per [facility] (RN given) -Hgb 8.1  Goal of Therapy:  Heparin level 0.3-0.7 units/ml   Plan:   On 2/10 @ 0700 (18 hours after Xarelto) start heparin @ 1500 Units/hr  Check aPTT/heparin level 6 hours later  Daily CBC  Follow for signs of bleeding  Dolly Rias RPh 03/11/2014, 3:34 PM Pager [phone]

## 2014-03-11 NOTE — Telephone Encounter (Signed)
Pt called from the hospital with request to talk to Dr.[name]. MD paged.

## 2014-03-11 NOTE — H&P (Signed)
[facility] History and Physical  [name] PFX:[id number] DOB: 05/01/1980 DOA: 03/11/2014  Referring physician: Dr. Blanchie Dessert PCP: [name],[name], MD   Chief Complaint: L sided chest pain, arm pain and right leg pain  HPI: [name] is a 34 y.o. male past medical history of sickle cell disease, and a PEG once relative that comes in for sudden onset of left-sided chest pain and arm with right leg pain that started on the day of admission about 2 AM in the morning. He relates it has progressively gotten worse so he came to the ED. He relates that his arm felt like he couldn't move it he is not sure whether it was from pain or he slept on it. He still having pain but is not as difficult to lose as it was when he came in. He relates no shortness of breath, fever, chills, nausea, vomiting or medication.   In the ED: Chest x-ray was to have cardiomegaly, 12-lead ekg shows sinus tachycardia., cbc showed leukocytosis with left shift and a potassium of 3.1.  Review of Systems:  Constitutional:  No weight loss, night sweats, Fevers, chills, fatigue.  HEENT:  No headaches, Difficulty swallowing,Tooth/dental problems,Sore throat,  No sneezing, itching, ear ache, nasal congestion, post nasal drip,  Cardio-vascular:  No chest pain, Orthopnea, PND, swelling in lower extremities, anasarca, dizziness, palpitations  GI:  No heartburn, indigestion, abdominal pain, nausea, vomiting, diarrhea, change in bowel habits, loss of appetite  Resp:  No shortness of breath with exertion or at rest. No excess mucus, no productive cough, No non-productive cough, No coughing up of blood.No change in color of mucus.No wheezing.No chest wall deformity  Skin:  no rash or lesions.  GU:  no dysuria, change in color of urine, no urgency or frequency. No flank pain.  Musculoskeletal:  No joint pain or swelling. No decreased range of motion. No back pain.  Psych:  No change in mood or affect. No  depression or anxiety. No memory loss.   Past Medical History  Diagnosis Date  . Sickle cell anemia   . Blood transfusion   . Acute embolism and thrombosis of right internal jugular vein   . Hypokalemia   . Mood disorder   . History of pulmonary embolus (PE)   . Avascular necrosis   . Leukocytosis     Chronic  . Thrombocytosis     Chronic  . Hypertension   . History of Clostridium difficile infection   . Uses marijuana   . Chronic anticoagulation   . Functional asplenia   . Former smoker   . Second hand tobacco smoke exposure   . Alcohol consumption of one to four drinks per day   . Noncompliance with medication regimen   . Sickle-cell crisis with associated acute chest syndrome 05/13/2013  . Acute chest syndrome 06/18/2013  . Demand ischemia 01/02/2014   Past Surgical History  Procedure Laterality Date  . Right hip replacement      08/2006  . Cholecystectomy      01/2008  . Porta cath placement    . Porta cath removal    . Umbilical hernia repair      01/2008  . Excision of left periauricular cyst      10/2009  . Excision of right ear lobe cyst with primary closur      11/2007  . Portacath placement  01/05/2012    Procedure: INSERTION PORT-A-CATH;  Surgeon: Odis Hollingshead, MD;  Location: [facility];  Service:  General;  Laterality: N/A;  ultrasound guiced port a cath insertion with fluoroscopy   Social History:  reports that he quit smoking about 3 years ago. He has never used smokeless tobacco. He reports that he drinks about 1.2 oz of alcohol per week. He reports that he uses illicit drugs (Marijuana) about twice per week.  No Known Allergies  Family History  Problem Relation Age of Onset  . Sickle cell trait Mother   . Depression Mother   . Diabetes Mother   . Sickle cell trait Father   . Sickle cell trait Brother     Prior to Admission medications   Medication Sig Start Date End Date Taking? Authorizing Provider  Cholecalciferol (VITAMIN D) 2000 UNITS tablet  Take 1 tablet (2,000 Units total) by mouth daily. 02/13/14  Yes [name], MD  folic acid (FOLVITE) 1 MG tablet Take 1 tablet (1 mg total) by mouth every morning. 10/08/13  Yes [name], MD  HYDROmorphone (DILAUDID) 4 MG tablet Take 1 tablet (4 mg total) by mouth every 4 (four) hours as needed for severe pain. 02/11/14  Yes [name], MD  hydroxyurea (HYDREA) 500 MG capsule Take 3 capsules (1,500 mg total) by mouth daily. May take with food to minimize GI side effects. 10/30/13  Yes [name], MD  lisinopril (PRINIVIL,ZESTRIL) 10 MG tablet Take 1 tablet (10 mg total) by mouth daily. 01/22/14  Yes [name], MD  metoprolol tartrate (LOPRESSOR) 25 MG tablet Take 1 tablet (25 mg total) by mouth daily. 08/22/13  Yes [name], MD  morphine (MS CONTIN) 30 MG 12 hr tablet Take 1 tablet (30 mg total) by mouth every 12 (twelve) hours. 02/26/14  Yes [name], MD  potassium chloride SA (K-DUR,KLOR-CON) 20 MEQ tablet Take 1 tablet (20 mEq total) by mouth every morning. 09/25/13  Yes [name], MD  rivaroxaban (XARELTO) 20 MG TABS tablet Take 1 tablet (20 mg total) by mouth every morning. 01/06/14  Yes [name], MD  zolpidem (AMBIEN) 10 MG tablet Take 1 tablet (10 mg total) by mouth at bedtime as needed for sleep. 10/28/13  Yes Dorena Dew, FNP   Physical Exam: Filed Vitals:   03/11/14 0925 03/11/14 0954 03/11/14 1057 03/11/14 1106  BP: 126/88 130/90 103/65 100/69  Pulse: 99 174 151   Temp:  98.4 F (36.9 C)    TempSrc:  Oral    Resp: 23 18 16    Height:  6' (1.829 m)    Weight:  73 kg (160 lb 15 oz)    SpO2: 94% 99%      Wt Readings from Last 3 Encounters:  03/11/14 73 kg (160 lb 15 oz)  03/06/14 73.936 kg (163 lb)  03/03/14 73.483 kg (162 lb)    General:  Appears calm and comfortable Eyes: PERRL, normal lids, irises & conjunctiva ENT: grossly normal hearing, lips & tongue Neck: no LAD, masses or  thyromegaly Cardiovascular: RRR, no m/r/g. No LE edema. Telemetry: SR, no arrhythmias  Respiratory: Good air movement with crackles on the right. Abdomen: soft, ntnd Skin: no rash or induration seen on limited exam Musculoskeletal: grossly normal tone BUE/BLE Psychiatric: grossly normal mood and affect, speech fluent and appropriate Neurologic: He is awake alert and oriented 3 [place] for language 3-12 are grossly intact sensation is intact, muscle strength is 5 over 5 in all 4 extremities and deep tendon reflexes 2+ symmetrical bilaterally.  Labs on Admission:  Basic Metabolic Panel:  Recent Labs Lab 03/06/14 0235 03/06/14 0531 03/08/14 0630 03/09/14 0343 03/11/14 0500  NA 138  --  140 140 137  K 3.0*  --  4.0 3.9 3.1*  CL 109  --  110 111 108  CO2 23  --  21 22 22   GLUCOSE 124*  --  111* 113* 121*  BUN 10  --  11 12 5*  CREATININE 0.57  --  0.66 0.55 0.51  CALCIUM 8.8  --  8.8 8.9 8.9  MG  --  1.7  --   --   --    Liver Function Tests:  Recent Labs Lab 03/06/14 0235 03/08/14 0630 03/09/14 0343 03/11/14 0500  AST 39* 44* 42* 44*  ALT 36 34 32 26  ALKPHOS 89 106 97 93  BILITOT 6.5* 6.7* 7.8* 9.4*  PROT 7.6 7.7 7.5 7.3  ALBUMIN 4.1 4.4 4.0 3.8    Recent Labs Lab 03/06/14 0235  LIPASE 34   No results for input(s): AMMONIA in the last 168 hours. CBC:  Recent Labs Lab 03/06/14 0235 03/07/14 1106 03/08/14 0630 03/09/14 0343 03/11/14 0500  WBC 21.1* 20.7* 18.8* 15.5* 23.9*  NEUTROABS 15.7*  --  12.3* 10.9* 19.9*  HGB 7.6* 6.9* 6.8* 6.9* 8.1*  HCT 21.8* 19.7* 19.5* 18.8* 22.7*  MCV 94.4 94.3 92.9 94.5 93.0  PLT 460* 391 374 386 411*   Cardiac Enzymes:  Recent Labs Lab 03/06/14 0235 03/06/14 1000 03/06/14 1455 03/11/14 0500  TROPONINI 0.37* 2.16* 2.49* 1.26*    BNP (last 3 results) No results for input(s): BNP in the last 8760 hours.  ProBNP (last 3 results)  Recent Labs  01/01/14 0909  PROBNP 5790.0*    CBG:  Recent  Labs Lab 03/06/14 0753 03/06/14 1755  GLUCAP 109* 86    Radiological Exams on Admission: Dg Chest 2 View  03/11/2014   CLINICAL DATA:  Acute onset of shortness of breath. Decreased O2 saturation. Generalized weakness and pain. Initial encounter.  EXAM: CHEST  2 VIEW  COMPARISON:  Chest radiograph performed 03/06/2014  FINDINGS: The lungs are well-aerated. Vascular congestion is noted. Minimal bilateral atelectasis is seen. There is no evidence of pleural effusion or pneumothorax.  The heart is mildly enlarged. A left-sided chest port is noted ending about the cavoatrial junction. No acute osseous abnormalities are seen.  IMPRESSION: Vascular congestion and mild cardiomegaly. Minimal bilateral atelectasis seen.   Electronically Signed   By: Garald Balding M.D.   On: 03/11/2014 06:24    EKG: Independently reviewed. Repeated EKG A. fib with RVR  Assessment/Plan Principal Problem:   Atrial fibrillation with RVR Active Problems:   Hx of pulmonary embolus   Chronic pain syndrome   Leukocytosis   Hypokalemia   HTN (hypertension)   Anemia   Sickle cell crisis   - I will go ahead and admit him to telemetry, I will start her on a diltiazem drip to control his heart rate. Had a previous 2-D echo that showed an ejection fraction of 55% with elevated right-sided pressures. The right-sided cavity size was moderately dilated. He is on Xarelto which she relates she has been taking, and he describes some pleuritic chest pain and has a history of PE. I hear some crackles on the right. I will go ahead and order a CT in general to rule out a PE and pneumonia. He denies any cough or fevers. It is unlikely that he has had a PE if he was  taking his Xarelto regularly.  -  We'll start him on 0.45 infusion of saline. Monitor strict I's and O's and replete his potassium. He denies any fevers at home and he has not had a fever here in the hospital I will not start empiric antibiotics at this time. - I will go ahead  and start him on Dilaudid PCA pump. Continue folate.  - He is nonfocal on physical exam so I doubt his stroke. The inability of move his upper extremity was probably due to pain.  Code Status: full DVT Prophylaxis:Xarelot Family Communication: none Disposition Plan: inpatient  Time spent: [place] [place] [facility] Pager [phone]

## 2014-03-11 NOTE — ED Notes (Signed)
Pt requesting more medication, pt aware will wait another 10 minutes.

## 2014-03-11 NOTE — Telephone Encounter (Signed)
Tried to reach Dr.[name] by phone while in the hospital. Dr.[name] notified and aware. Pt directed to speak to his RN and attending MD; msg left for pt's current RN. [name], [name]

## 2014-03-11 NOTE — ED Notes (Signed)
MD at bedside, aware pt requesting more medication at this time

## 2014-03-11 NOTE — ED Notes (Signed)
Troponin 1 as add on in main lab.

## 2014-03-12 DIAGNOSIS — R7989 Other specified abnormal findings of blood chemistry: Secondary | ICD-10-CM

## 2014-03-12 DIAGNOSIS — I2489 Other forms of acute ischemic heart disease: Secondary | ICD-10-CM | POA: Diagnosis present

## 2014-03-12 DIAGNOSIS — D72829 Elevated white blood cell count, unspecified: Secondary | ICD-10-CM

## 2014-03-12 DIAGNOSIS — I2699 Other pulmonary embolism without acute cor pulmonale: Principal | ICD-10-CM

## 2014-03-12 DIAGNOSIS — R072 Precordial pain: Secondary | ICD-10-CM

## 2014-03-12 DIAGNOSIS — D5700 Hb-SS disease with crisis, unspecified: Secondary | ICD-10-CM

## 2014-03-12 DIAGNOSIS — I4891 Unspecified atrial fibrillation: Secondary | ICD-10-CM

## 2014-03-12 DIAGNOSIS — I248 Other forms of acute ischemic heart disease: Secondary | ICD-10-CM | POA: Diagnosis present

## 2014-03-12 LAB — CBC
HEMATOCRIT: 20.9 % — AB (ref 39.0–52.0)
Hemoglobin: 7.3 g/dL — ABNORMAL LOW (ref 13.0–17.0)
MCH: 33.8 pg (ref 26.0–34.0)
MCHC: 34.9 g/dL (ref 30.0–36.0)
MCV: 96.8 fL (ref 78.0–100.0)
Platelets: 390 10*3/uL (ref 150–400)
RBC: 2.16 MIL/uL — AB (ref 4.22–5.81)
RDW: 23.5 % — ABNORMAL HIGH (ref 11.5–15.5)
WBC: 21.8 10*3/uL — ABNORMAL HIGH (ref 4.0–10.5)

## 2014-03-12 LAB — BASIC METABOLIC PANEL
ANION GAP: 7 (ref 5–15)
BUN: 7 mg/dL (ref 6–23)
CO2: 22 mmol/L (ref 19–32)
Calcium: 8.4 mg/dL (ref 8.4–10.5)
Chloride: 110 mmol/L (ref 96–112)
Creatinine, Ser: 0.54 mg/dL (ref 0.50–1.35)
GFR calc Af Amer: >90 mL/min (ref >90)
GFR calc non Af Amer: >90 mL/min (ref >90)
Glucose, Bld: 135 mg/dL — ABNORMAL HIGH (ref 70–99)
POTASSIUM: 3.7 mmol/L (ref 3.5–5.1)
Sodium: 139 mmol/L (ref 135–145)

## 2014-03-12 LAB — TROPONIN I: TROPONIN I: 1.63 ng/mL — AB (ref <0.031)

## 2014-03-12 MED ORDER — DILTIAZEM HCL ER 60 MG PO CP12
60.0000 mg | ORAL_CAPSULE | Freq: Two times a day (BID) | ORAL | Status: DC
  Administered 2014-03-12 – 2014-03-13 (×3): 60 mg via ORAL
  Filled 2014-03-12 (×3): qty 1

## 2014-03-12 MED ORDER — SODIUM CHLORIDE 0.9 % IJ SOLN
10.0000 mL | INTRAMUSCULAR | Status: DC | PRN
  Administered 2014-03-12: 20 mL
  Administered 2014-03-13 (×3): 10 mL
  Filled 2014-03-12 (×3): qty 40

## 2014-03-12 MED ORDER — RIVAROXABAN 20 MG PO TABS
20.0000 mg | ORAL_TABLET | Freq: Every day | ORAL | Status: DC
  Administered 2014-03-12: 20 mg via ORAL
  Filled 2014-03-12 (×2): qty 1

## 2014-03-12 NOTE — Progress Notes (Addendum)
PROGRESS NOTE  ALOYSIUS [name] TDD:[id number] DOB: April 27, 1980 DOA: 03/11/2014 PCP: Gerald A., MD  HPI: 34 year old male with history of HGB SS disease presented to the emergency room with chest pain, patient reports this is typical for his sickle cell pain crisis, who was also found to be in A. fib with RVR. Endorsed some noncompliance with Xarelto, underwent a CTPA which showed acute on chronic PE.  Subjective / 24 H Interval events - He feels better this morning, his pain is improving, feels like his chest pain on presentation was typical for sickle cell crisis  Assessment/Plan: Principal Problem:   Atrial fibrillation with RVR Active Problems:   Hx of pulmonary embolus   Leukocytosis   Hypokalemia   HTN (hypertension)   Anemia   Chronic pain syndrome   Sickle cell crisis   Chest pain - patient with history of acute on chronic chest pain, this may be multifactorial due to A. fib with RVR, pulmonary embolus, sickle cell crisis accompanied by anemia. He has a history of same in December 2015, he had a troponin elevation up to 9.3, cardiology was seeing him at that point and recommended outpatient stress test, however patient did not follow up. - Troponin is elevated this time around as well, I consulted cardiology, appreciate input  A. fib with RVR - paroxysmal, likely due to the presence of PE as well as anemia, he converted back to sinus rhythm while on Cardizem drip, I've added low-dose by mouth Cardizem. - He is on anticoagulation for his PE  Pulmonary embolism - patient missed a few doses at home, he only states just 1-2 times, he downplays this - resume Xarelto  Hb SS with crisis - patient appears to be in crisis with increased chest/rib pain on left and right side as well as in his back, continue dilaudid PCA and long acting home medications. He feels like he is improving, pain 6/10.  Anemia - due to sickle cell, he is around baseline, will transfuse for Hb <  7  Diet: Diet regular Fluids: 1/2 NS DVT Prophylaxis: Xarelto  Code Status: Full Code Family Communication: d/w patient  Disposition Plan: inpatient, home when ready   Consultants:  Cardiology   Procedures:  None    Antibiotics  Anti-infectives    None       Studies  Dg Chest 2 View  03/11/2014   CLINICAL DATA:  Acute onset of shortness of breath. Decreased O2 saturation. Generalized weakness and pain. Initial encounter.  EXAM: CHEST  2 VIEW  COMPARISON:  Chest radiograph performed 03/06/2014  FINDINGS: The lungs are well-aerated. Vascular congestion is noted. Minimal bilateral atelectasis is seen. There is no evidence of pleural effusion or pneumothorax.  The heart is mildly enlarged. A left-sided chest port is noted ending about the cavoatrial junction. No acute osseous abnormalities are seen.  IMPRESSION: Vascular congestion and mild cardiomegaly. Minimal bilateral atelectasis seen.   Electronically Signed   By: [name] M.D.   On: 03/11/2014 06:24   Ct Angio Chest Pe W/cm &/or Wo Cm  03/11/2014   CLINICAL DATA:  Sickle cell disease with sudden onset left-sided chest pain.  EXAM: CT ANGIOGRAPHY CHEST WITH CONTRAST  TECHNIQUE: Multidetector CT imaging of the chest was performed using the standard protocol during bolus administration of intravenous contrast. Multiplanar CT image reconstructions and MIPs were obtained to evaluate the vascular anatomy.  CONTRAST:  156mL OMNIPAQUE IOHEXOL 350 MG/ML SOLN  COMPARISON:  01/01/2014  FINDINGS: THORACIC INLET/BODY WALL:  Left subclavian porta catheter, tip in the right atrium.  MEDIASTINUM:  There is multi focal pulmonary embolism with clot primarily subsegmental but intermittently occlusive. Clot in the posterior segment left upper lobe on image 92, and in the left lower lobe on image 175 and 183 is chronic and without progression from 01/01/2014. The lower lobe clot was previously obscured by artifact, but with the benefit of the  current scan was present. There is acute pulmonary embolism to the right upper and right lower lobes, visualized on images 117, 164 and 177.  There is chronic cardiomegaly and right heart enlargement, which limits utility of the RV to LV ratio. The main pulmonary artery is chronically dilated at 37 mm. There is no progression of these findings to suggest acute right heart failure. No pericardial effusion. No aortic dissection. There is chronic mild enlargement of upper mediastinal lymph nodes.  LUNG WINDOWS:  Patchy ground-glass density in the bilateral lungs, with lower lung subsegmental atelectasis or scarring that is stable from prior and likely fixed, chronic lung disease. No lung infarct noted.  UPPER ABDOMEN:  Splenic auto infarction.  OSSEOUS:  Typical osseous changes of sickle cell disease.  Critical Value/emergent results were called by telephone at the time of interpretation on 03/11/2014 at 2:00 pm to [name] , who verbally acknowledged these results.  Review of the MIP images confirms the above findings.  IMPRESSION: Acute on chronic pulmonary embolism with diffuse subsegmental clot. Chronic pulmonary artery hypertension; no morphologic change to suggest acute right heart failure.   Electronically Signed   By: [name] M.D.   On: 03/11/2014 14:08    Objective  Filed Vitals:   03/12/14 0951 03/12/14 1323 03/12/14 1336 03/12/14 1600  BP: 111/69  116/70   Pulse: 85  78   Temp: 98.5 F (36.9 C)  98.4 F (36.9 C)   TempSrc: Oral  Oral   Resp: 19 14 18 17   Height:      Weight:      SpO2: 100% 100% 100% 94%    Intake/Output Summary (Last 24 hours) at 03/12/14 1647 Last data filed at 03/12/14 1300  Gross per 24 hour  Intake 1296.25 ml  Output   2015 ml  Net -718.75 ml   Filed Weights   03/11/14 0738 03/11/14 0954  Weight: 73.936 kg (163 lb) 73 kg (160 lb 15 oz)    Exam:  General:  NAD  HEENT: +scleral icterus  Cardiovascular: RRR  Respiratory: CTA  biL  Abdomen: soft, non tender  Skin: no rashes  Neuro: non focal   Data Reviewed: Basic Metabolic Panel:  Recent Labs Lab 03/06/14 0235 03/06/14 0531 03/08/14 0630 03/09/14 0343 03/11/14 0500 03/12/14 0410  NA 138  --  140 140 137 139  K 3.0*  --  4.0 3.9 3.1* 3.7  CL 109  --  110 111 108 110  CO2 23  --  21 22 22 22   GLUCOSE 124*  --  111* 113* 121* 135*  BUN 10  --  11 12 5* 7  CREATININE 0.57  --  0.66 0.55 0.51 0.54  CALCIUM 8.8  --  8.8 8.9 8.9 8.4  MG  --  1.7  --   --   --   --    Liver Function Tests:  Recent Labs Lab 03/06/14 0235 03/08/14 0630 03/09/14 0343 03/11/14 0500  AST 39* 44* 42* 44*  ALT 36 34 32 26  ALKPHOS 89 106 97 93  BILITOT  6.5* 6.7* 7.8* 9.4*  PROT 7.6 7.7 7.5 7.3  ALBUMIN 4.1 4.4 4.0 3.8    Recent Labs Lab 03/06/14 0235  LIPASE 34   No results for input(s): AMMONIA in the last 168 hours. CBC:  Recent Labs Lab 03/06/14 0235 03/07/14 1106 03/08/14 0630 03/09/14 0343 03/11/14 0500 03/12/14 0410  WBC 21.1* 20.7* 18.8* 15.5* 23.9* 21.8*  NEUTROABS 15.7*  --  12.3* 10.9* 19.9*  --   HGB 7.6* 6.9* 6.8* 6.9* 8.1* 7.3*  HCT 21.8* 19.7* 19.5* 18.8* 22.7* 20.9*  MCV 94.4 94.3 92.9 94.5 93.0 96.8  PLT 460* 391 374 386 411* 390   Cardiac Enzymes:  Recent Labs Lab 03/06/14 0235 03/06/14 1000 03/06/14 1455 03/11/14 0500 03/12/14 1230  TROPONINI 0.37* 2.16* 2.49* 1.26* 1.63*   ProBNP (last 3 results)  Recent Labs  01/01/14 0909  PROBNP 5790.0*    CBG:  Recent Labs Lab 03/06/14 0753 03/06/14 1755  GLUCAP 109* 86    Recent Results (from the past 240 hour(s))  MRSA PCR Screening     Status: Abnormal   Collection Time: 03/06/14 12:28 PM  Result Value Ref Range Status   MRSA by PCR POSITIVE (A) NEGATIVE Final    Comment:        The GeneXpert MRSA Assay (FDA approved for NASAL specimens only), is one component of a comprehensive MRSA colonization surveillance program. It is not intended to diagnose  MRSA infection nor to guide or monitor treatment for MRSA infections. RESULT CALLED TO, READ BACK BY AND VERIFIED WITH: GAGLIANO,S @1421  ON [id number] BY POTEAT,S      Scheduled Meds: . diltiazem  60 mg Oral Q12H  . folic acid  1 mg Oral q morning - 10a  . HYDROmorphone PCA 0.3 mg/mL   Intravenous 6 times per day  . hydroxyurea  1,500 mg Oral Daily  . lisinopril  10 mg Oral Daily  . metoprolol tartrate  25 mg Oral Daily  . morphine  30 mg Oral Q12H  . potassium chloride SA  20 mEq Oral q morning - 10a  . rivaroxaban  20 mg Oral Daily  . sodium chloride  3 mL Intravenous Q12H   Continuous Infusions: . sodium chloride 75 mL/hr at 03/12/14 [place], MD [facility] Pager [phone]. If 7 PM - 7 AM, please contact night-coverage at [email or web address], password [facility] 03/12/2014, 4:47 PM  LOS: 1 day

## 2014-03-12 NOTE — Clinical Documentation Improvement (Addendum)
#  1   Please clarify if the diagnosis of "Sickle Cell Crisis" is applicable to the current admission.  #2   Please clarify the TYPE of A-fib with RVR:  - Paroxsymal  - Persistent  - Other Type  - Unable to Clinically Determine  #3   Please clarify the TYPE of Sickle Cell.   Thank You, Erling Conte ,RN Clinical Documentation Specialist:  [phone] [place] Information Management

## 2014-03-12 NOTE — Consult Note (Addendum)
Patient ID: [name] MRN: [id number] DOB/AGE: May 22, 1980 34 y.o.  Admit date: 03/11/2014 Primary Cardiologist: Stanford Breed Reason for Consultation: Chest pain, elevated troponin, atrial fib  HPI: 34 yo male with history of sickle cell anemia, PE, right jugular vein thrombosis, avascular necrosis, thrombocytosis, HTN, former tobacco abuse admitted with chest pain. CTA showed acute on chronic PE. He is on chronic Xarelto. Also found to be in atrial fibrillation with RVR this admission. He has been seen in the past by Dr. Stanford Breed and most recently was followed by cardiology during admission December 2015 at which time he had an elevated troponin (peak 9) felt to be due to demand ischemia with anemia, PE. Last echo 01/02/14 with normal LV systolic function, RVSP 62 mmHg, moderate TR. Last Myoview was on 12/25/2009 which showed EF 62%, no inducible myocardial ischemia. He was diagnosed with pulmonary embolism 2014 and was placed on Xarelto. At time of discharge December 2015, pt had outpatient stress testing arranged but did not follow up. He also missed f/u appt with Dr. Stanford Breed. Troponin is 1.63 today. EKG is grossly unchanged.   He tells me that his chest pain is resolved. No SOB. No lower ext edema, dizziness.   Tele: sinus  Past Medical History  Diagnosis Date  . Sickle cell anemia   . Blood transfusion   . Acute embolism and thrombosis of right internal jugular vein   . Hypokalemia   . Mood disorder   . History of pulmonary embolus (PE)   . Avascular necrosis   . Leukocytosis     Chronic  . Thrombocytosis     Chronic  . Hypertension   . History of Clostridium difficile infection   . Uses marijuana   . Chronic anticoagulation   . Functional asplenia   . Former smoker   . Second hand tobacco smoke exposure   . Alcohol consumption of one to four drinks per day   . Noncompliance with medication regimen   . Sickle-cell crisis with associated acute chest syndrome 05/13/2013    . Acute chest syndrome 06/18/2013  . Demand ischemia 01/02/2014    Family History  Problem Relation Age of Onset  . Sickle cell trait Mother   . Depression Mother   . Diabetes Mother   . Sickle cell trait Father   . Sickle cell trait Brother     History   Social History  . Marital Status: Single    Spouse Name: N/A  . Number of Children: 0  . Years of Education: 13   Occupational History  . 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 Smoking status: Former Smoker -- 13 years    Quit date: 07/08/2010  . Smokeless tobacco: Never Used  . Alcohol Use: 1.2 oz/week    2 Shots of liquor per week  . Drug Use: 2.00 per week    Special: Marijuana  . Sexual Activity:    Partners: Female    Museum/gallery curator: None     Comment: month ago   Other Topics Concern  . Not on file   Social History Narrative   Lives in an apartment.  Single.  Lives alone but has a girlfriend that helps care for him.  Does not use any assist devices.        [name]:  [phone] Mom, emergency contact    Past Surgical History  Procedure Laterality Date  .  Right hip replacement      08/2006  . Cholecystectomy      01/2008  . Porta cath placement    . Porta cath removal    . Umbilical hernia repair      01/2008  . Excision of left periauricular cyst      10/2009  . Excision of right ear lobe cyst with primary closur      11/2007  . Portacath placement  01/05/2012    Procedure: INSERTION PORT-A-CATH;  Surgeon: Odis Hollingshead, MD;  Location: [facility];  Service: General;  Laterality: N/A;  ultrasound guiced port a cath insertion with fluoroscopy    No Known Allergies   Hospital Medications:  . diltiazem  60 mg Oral Q12H  . folic acid  1 mg Oral q morning - 10a  . HYDROmorphone PCA 0.3 mg/mL   Intravenous 6 times per day  . hydroxyurea  1,500 mg Oral Daily  . lisinopril  10 mg Oral Daily  . metoprolol tartrate  25 mg Oral Daily   . morphine  30 mg Oral Q12H  . potassium chloride SA  20 mEq Oral q morning - 10a  . rivaroxaban  20 mg Oral Daily  . sodium chloride  3 mL Intravenous Q12H    Prior to Admission medications   Medication Sig Start Date End Date Taking? Authorizing Provider  Cholecalciferol (VITAMIN D) 2000 UNITS tablet Take 1 tablet (2,000 Units total) by mouth daily. 02/13/14  Yes [name], MD  folic acid (FOLVITE) 1 MG tablet Take 1 tablet (1 mg total) by mouth every morning. 10/08/13  Yes [name], MD  HYDROmorphone (DILAUDID) 4 MG tablet Take 1 tablet (4 mg total) by mouth every 4 (four) hours as needed for severe pain. 02/11/14  Yes [name], MD  hydroxyurea (HYDREA) 500 MG capsule Take 3 capsules (1,500 mg total) by mouth daily. May take with food to minimize GI side effects. 10/30/13  Yes [name], MD  lisinopril (PRINIVIL,ZESTRIL) 10 MG tablet Take 1 tablet (10 mg total) by mouth daily. 01/22/14  Yes [name], MD  metoprolol tartrate (LOPRESSOR) 25 MG tablet Take 1 tablet (25 mg total) by mouth daily. 08/22/13  Yes [name], MD  morphine (MS CONTIN) 30 MG 12 hr tablet Take 1 tablet (30 mg total) by mouth every 12 (twelve) hours. 02/26/14  Yes [name], MD  potassium chloride SA (K-DUR,KLOR-CON) 20 MEQ tablet Take 1 tablet (20 mEq total) by mouth every morning. 09/25/13  Yes [name], MD  rivaroxaban (XARELTO) 20 MG TABS tablet Take 1 tablet (20 mg total) by mouth every morning. 01/06/14  Yes [name], MD  zolpidem (AMBIEN) 10 MG tablet Take 1 tablet (10 mg total) by mouth at bedtime as needed for sleep. 10/28/13  Yes Dorena Dew, FNP    Review of systems complete and found to be negative unless listed above   Physical Exam: Blood pressure 116/70, pulse 78, temperature 98.4 F (36.9 C), temperature source Oral, resp. rate 17, height 6' (1.829 m), weight 160 lb 15 oz (73 kg), SpO2 94 %.    General: Well  developed, well nourished, NAD  HEENT: OP clear, mucus membranes moist  SKIN: warm, dry. No rashes.  Neuro: No focal deficits  Musculoskeletal: Muscle strength 5/5 all ext  Psychiatric: Mood and affect normal  Neck: No JVD, no carotid bruits, no thyromegaly, no lymphadenopathy.  Lungs:Clear bilaterally, no wheezes, rhonci, crackles  Cardiovascular: Regular rate and rhythm. No murmurs, gallops or rubs.  Abdomen:Soft. Bowel sounds present. Non-tender.  Extremities: No lower extremity edema. Pulses are 2 + in the bilateral DP/PT.  Labs:   Lab Results  Component Value Date   WBC 21.8* 03/12/2014   HGB 7.3* 03/12/2014   HCT 20.9* 03/12/2014   MCV 96.8 03/12/2014   PLT 390 03/12/2014     Recent Labs Lab 03/11/14 0500 03/12/14 0410  NA 137 139  K 3.1* 3.7  CL 108 110  CO2 22 22  BUN 5* 7  CREATININE 0.51 0.54  CALCIUM 8.9 8.4  PROT 7.3  --   BILITOT 9.4*  --   ALKPHOS 93  --   ALT 26  --   AST 44*  --   GLUCOSE 121* 135*   Lab Results  Component Value Date             TROPONINI 1.63* 03/12/2014    Echo 01/02/14: Left ventricle: The cavity size was normal. Systolic function was normal. The estimated ejection fraction was in the range of 55% to 60%. Wall motion was normal; there were no regional wall motion abnormalities. - Ventricular septum: There was D-shaped septum consistent with elevated right ventricular pressure/volume overload. - Aortic valve: There was trivial regurgitation. - Mitral valve: Mild prolapse, involving the anterior leaflet. - Right ventricle: The cavity size was moderately dilated. Wall thickness was normal. Systolic function was moderately reduced. - Right atrium: The atrium was moderately to severely dilated. - Tricuspid valve: Leaflet thickening. There was moderate regurgitation. - Pulmonary arteries: Systolic pressure was moderately to severely increased. PA peak pressure: 62 mm Hg (S). - Pericardium, extracardiac: A  trivial pericardial effusion was identified posterior to the heart. Impressions: - Compared to the prior study, there has been no significant interval change.   CTA chest: 03/11/14:  Acute on chronic pulmonary embolism with diffuse subsegmental clot. Chronic pulmonary artery hypertension; no morphologic change to suggest acute right heart failure.  EKG: sinus, RVH, LAE, T wave inversion anterolateral leads  ASSESSMENT AND PLAN:   1. Chest pain syndrome/Elevated troponin: Likely due to demand ischemia given his anemia, acute PE and typical sickle cell crisis. He will likely need a stress myoview at some point but he states that he does not wish to have this right now. Could plan as outpatient.   2. Atrial fib with RVR: Back in sinus today. Continue Xarelto. Continue metoprolol and diltiazem. Convert to long acting Cardizem tomorrow.   3. Acute on chronic PE: per primary team. Continue Xarelto for now. Consider alternative since pt has demonstrated recurrence of PE while taking xarelto.   4. Sickle cell anemia: Per primary team.    Signed: Lauree Chandler, MD 03/12/2014, 6:05 PM

## 2014-03-12 NOTE — Progress Notes (Signed)
ANTICOAGULATION CONSULT NOTE - Initial Consult  Pharmacy Consult xarelto Indication: PE   No Known Allergies  Patient Measurements: Height: 6' (182.9 cm) Weight: 160 lb 15 oz (73 kg) IBW/kg (Calculated) : 77.6   Vital Signs: Temp: 98.5 F (36.9 C) (02/10 0951) Temp Source: Oral (02/10 0951) BP: 111/69 mmHg (02/10 0951) Pulse Rate: 85 (02/10 0951)  Labs:  Recent Labs  03/11/14 0500 03/12/14 0410  HGB 8.1* 7.3*  HCT 22.7* 20.9*  PLT 411* 390  CREATININE 0.51 0.54  TROPONINI 1.26*  --     Estimated Creatinine Clearance: 134.3 mL/min (by C-G formula based on Cr of 0.54).   Medical History: Past Medical History  Diagnosis Date  . Sickle cell anemia   . Blood transfusion   . Acute embolism and thrombosis of right internal jugular vein   . Hypokalemia   . Mood disorder   . History of pulmonary embolus (PE)   . Avascular necrosis   . Leukocytosis     Chronic  . Thrombocytosis     Chronic  . Hypertension   . History of Clostridium difficile infection   . Uses marijuana   . Chronic anticoagulation   . Functional asplenia   . Former smoker   . Second hand tobacco smoke exposure   . Alcohol consumption of one to four drinks per day   . Noncompliance with medication regimen   . Sickle-cell crisis with associated acute chest syndrome 05/13/2013  . Acute chest syndrome 06/18/2013  . Demand ischemia 01/02/2014      Assessment: 34 y.o. male past medical history of sickle cell disease,  comes in for sudden onset of left-sided chest pain and arm with right leg pain that started on the day of admission about 2 AM in the morning. He relates it has progressively gotten worse so he came to the ED. He relates that his arm felt like he couldn't move it he is not sure whether it was from pain or he slept on it.  He relates no shortness of breath, fever, chills, nausea, vomiting or medication.  Pharmacy consulted on 2/9 sto dose heparin for new PE, now pharmacy asked to dose  Xarelto.  -pt had been on Xarelto 20mg  daily but compliance is in question -CrCl >100 mls/min -last dose of xarelto 20mg  2/9 @ 1300 per MAR (RN given) -Hgb 8.1, plts WNL -pt on diltiazem  Goal of Therapy:  xarelto per renal function  Plan:   Stop heparin @ 1330  Start xarelto 20mg  daily @ 1400  Daily CBC  Follow for signs of bleeding  Dolly Rias RPh 03/12/2014, 1:08 PM Pager [phone]

## 2014-03-13 ENCOUNTER — Encounter: Payer: Self-pay | Admitting: Cardiology

## 2014-03-13 VITALS — Ht 72.0 in

## 2014-03-13 DIAGNOSIS — I10 Essential (primary) hypertension: Secondary | ICD-10-CM

## 2014-03-13 DIAGNOSIS — I248 Other forms of acute ischemic heart disease: Secondary | ICD-10-CM

## 2014-03-13 DIAGNOSIS — Z86711 Personal history of pulmonary embolism: Secondary | ICD-10-CM

## 2014-03-13 DIAGNOSIS — G894 Chronic pain syndrome: Secondary | ICD-10-CM

## 2014-03-13 DIAGNOSIS — D589 Hereditary hemolytic anemia, unspecified: Secondary | ICD-10-CM

## 2014-03-13 LAB — CBC
HCT: 22 % — ABNORMAL LOW (ref 39.0–52.0)
Hemoglobin: 7.6 g/dL — ABNORMAL LOW (ref 13.0–17.0)
MCH: 33.6 pg (ref 26.0–34.0)
MCHC: 34.5 g/dL (ref 30.0–36.0)
MCV: 97.3 fL (ref 78.0–100.0)
PLATELETS: 395 10*3/uL (ref 150–400)
RBC: 2.26 MIL/uL — ABNORMAL LOW (ref 4.22–5.81)
RDW: 24.3 % — ABNORMAL HIGH (ref 11.5–15.5)
WBC: 17 10*3/uL — AB (ref 4.0–10.5)

## 2014-03-13 LAB — COMPREHENSIVE METABOLIC PANEL
ALT: 20 U/L (ref 0–53)
AST: 35 U/L (ref 0–37)
Albumin: 3.9 g/dL (ref 3.5–5.2)
Alkaline Phosphatase: 76 U/L (ref 39–117)
Anion gap: 6 (ref 5–15)
BUN: 6 mg/dL (ref 6–23)
CALCIUM: 8.3 mg/dL — AB (ref 8.4–10.5)
CO2: 22 mmol/L (ref 19–32)
CREATININE: 0.41 mg/dL — AB (ref 0.50–1.35)
Chloride: 109 mmol/L (ref 96–112)
GFR calc Af Amer: >90 mL/min (ref >90)
GLUCOSE: 102 mg/dL — AB (ref 70–99)
Potassium: 4.1 mmol/L (ref 3.5–5.1)
Sodium: 137 mmol/L (ref 135–145)
Total Bilirubin: 5.6 mg/dL — ABNORMAL HIGH (ref 0.3–1.2)
Total Protein: 7 g/dL (ref 6.0–8.3)

## 2014-03-13 MED ORDER — RIVAROXABAN 20 MG PO TABS: 20.0000 mg | ORAL_TABLET | Freq: Every morning | ORAL | Status: DC

## 2014-03-13 MED ORDER — HEPARIN SOD (PORK) LOCK FLUSH 100 UNIT/ML IV SOLN
500.0000 [IU] | INTRAVENOUS | Status: AC | PRN
  Administered 2014-03-13: 500 [IU]

## 2014-03-13 MED ORDER — RIVAROXABAN 20 MG PO TABS
20.0000 mg | ORAL_TABLET | Freq: Every day | ORAL | Status: DC
  Filled 2014-03-13: qty 1

## 2014-03-13 MED ORDER — DILTIAZEM HCL ER COATED BEADS 120 MG PO CP24: 120.0000 mg | ORAL_CAPSULE | Freq: Every day | ORAL | Status: DC

## 2014-03-13 MED ORDER — HYDROMORPHONE HCL 4 MG PO TABS: 4.0000 mg | ORAL_TABLET | ORAL | Status: DC | PRN

## 2014-03-13 NOTE — Progress Notes (Signed)
34 yo male with history of sickle cell anemia, PE, right jugular vein thrombosis, avascular necrosis, thrombocytosis, HTN, former tobacco abuse admitted with chest pain. CTA showed acute on chronic PE. He is on chronic Xarelto. Also found to be in atrial fibrillation with RVR this admission. He has been seen in the past by Dr. Stanford Breed and most recently was followed by cardiology during admission December 2015 at which time he had an elevated troponin (peak 9) felt to be due to demand ischemia with anemia, PE. Last echo 01/02/14 with normal LV systolic function, RVSP 62 mmHg, moderate TR. Last Myoview was on 12/25/2009 which showed EF 62%, no inducible myocardial ischemia. He was diagnosed with pulmonary embolism 2014 and was placed on Xarelto. At time of discharge December 2015, pt had outpatient stress testing arranged but did not follow up. He also missed f/u appt with Dr. Stanford Breed. Troponin is 1.63 2/10. EKG is grossly unchanged  Subjective:  Feels better this AM.  Wants to go home.  Says he may have missed a "dose or 2 of Xarelto"  Objective:  Vital Signs in the last 24 hours: Temp:  [98.2 F (36.8 C)-98.5 F (36.9 C)] 98.5 F (36.9 C) (02/11 0558) Pulse Rate:  [78-85] 82 (02/11 0558) Resp:  [14-23] 14 (02/11 0558) BP: (111-121)/(67-81) 111/67 mmHg (02/11 0558) SpO2:  [94 %-100 %] 99 % (02/11 0558)  Intake/Output from previous day: 02/10 0701 - 02/11 0700 In: 3251.6 [P.O.:1440; I.V.:1811.6] Out: 3125 [Urine:3125] Intake/Output from this shift:    Physical Exam: General: Well developed, well nourished, NAD  - sleepy HEENT: OP clear, mucus membranes moist  SKIN: warm, dry. No rashes.  Neuro: No focal deficits  Musculoskeletal: Muscle strength 5/5 all ext  Psychiatric: Mood and affect normal  Neck: No JVD, no carotid bruits, no thyromegaly, no lymphadenopathy.  Lungs:Clear bilaterally, no wheezes, rhonci, crackles  Cardiovascular: RRR.  No murmurs, gallops or rubs.   Abdomen:Soft. Bowel sounds present. Non-tender.  Extremities: No lower extremity edema. Pulses are 2 + in the bilateral DP/PT.  Lab Results:  Recent Labs  03/12/14 0410 03/13/14 0441  WBC 21.8* 17.0*  HGB 7.3* 7.6*  PLT 390 395    Recent Labs  03/11/14 0500 03/12/14 0410  NA 137 139  K 3.1* 3.7  CL 108 110  CO2 22 22  GLUCOSE 121* 135*  BUN 5* 7  CREATININE 0.51 0.54    Recent Labs  03/11/14 0500 03/12/14 1230  TROPONINI 1.26* 1.63*   Hepatic Function Panel  Recent Labs  03/11/14 0500  PROT 7.3  ALBUMIN 3.8  AST 44*  ALT 26  ALKPHOS 93  BILITOT 9.4*   No results for input(s): CHOL in the last 72 hours. No results for input(s): PROTIME in the last 72 hours.  Imaging: Imaging results have been reviewed - no new studies.  Cardiac Studies:  Tele: Currently NSR - 70s; earlier this AM did have tachycardia that appears to be STachy in120s-130s (some may have been Afib - difficult to tell due to artifact)  Assessment/Plan:  Principal Problem:   Atrial fibrillation with RVR Active Problems:   Demand ischemia of myocardium   Hx of pulmonary embolus   Pulmonary HTN   Essential hypertension   Anemia   Leukocytosis   Hypokalemia   Chronic pain syndrome   Sickle cell crisis   1. Chest pain syndrome/Elevated troponin-- demand ischemia in setting of anemia, acute PE and typical sickle cell crisis. He will likely need a stress myoview at  some point but he states that he does not wish to have this right now. Could plan as outpatient.   2. Atrial fib with RVR: Back in sinus today. Continue Xarelto (or consider other agent if recurrent PE on Xarelto). Continue metoprolol and diltiazem. Converted to PO Cardizem last PM - once HR is stable & BP stable - convert to equivalent long-acting (BID or qDay Diltiazem).   3. Acute on chronic PE: per primary team. Continue Xarelto for now. Consider alternative since pt has demonstrated recurrence of PE while taking  xarelto.   4. Sickle cell anemia: Per primary team.   He indicates that he would be willing to do Stress Test - if he will stay & do it tomorrow - may be best to avoid beign lost to f/u.  If not - we will help arrange as OP    LOS: 2 days    HARDING, DAVID W 03/13/2014, 7:12 AM

## 2014-03-13 NOTE — Discharge Summary (Signed)
Physician Discharge Summary  LYRICK LAGRAND OVF:[id number] DOB: 1981-01-01 DOA: 03/11/2014  PCP: [name],[name], MD  Admit date: 03/11/2014 Discharge date: 03/13/2014  Time spent: 45 minutes  Recommendations for Outpatient Follow-up:  1. Follow up with Cardiology for stress test. Cardiology will arrange timing 2. Follow up with Dr. Zigmund Daniel in 1-2 weeks   Discharge Diagnoses:  Principal Problem:   Atrial fibrillation with RVR Active Problems:   Hx of pulmonary embolus   Leukocytosis   Hypokalemia   Pulmonary HTN   Essential hypertension   Anemia   Chronic pain syndrome   Sickle cell crisis   Demand ischemia of myocardium  Discharge Condition: stable  Diet recommendation: heart healthy  Filed Weights   03/11/14 0738 03/11/14 0954  Weight: 73.936 kg (163 lb) 73 kg (160 lb 15 oz)   History of present illness:  [name] is a 34 y.o. male past medical history of sickle cell disease, and a PEG once relative that comes in for sudden onset of left-sided chest pain and arm with right leg pain that started on the day of admission about 2 AM in the morning. He relates it has progressively gotten worse so he came to the ED. He relates that his arm felt like he couldn't move it he is not sure whether it was from pain or he slept on it. He still having pain but is not as difficult to lose as it was when he came in. He relates no shortness of breath, fever, chills, nausea, vomiting or medication.  Hospital Course:  Chest pain - patient with history of acute on chronic chest pain, this may be multifactorial currently due to A. fib with RVR, pulmonary embolus, sickle cell crisis accompanied by anemia. His chest pain has resolved shortly after admission. He has a history of same in December 2015, he had a troponin elevation up to 9.3, cardiology was seeing him at that point and recommended outpatient stress test, however patient did not follow up. Troponin is elevated this time  around as well. I have consulted cardiology and again recommended a stress test, however patient declined to have the stress test done as an inpatient and states that he will have it done as an outpatient. I've discussed with cardiology, and they will arrange this. A. fib with RVR - paroxysmal, likely due to the presence of PE as well as anemia, he converted back to sinus rhythm while on Cardizem drip, I've added oral Cardizem to his home regimen. Patient has maintained sinus rhythm after initial conversion.  Pulmonary embolism - patient with persistent pulmonary embolus likely in the setting of medication noncompliance as he endorsed missing doses at home, unlikely to represent treatment failure. His Xarelto was resumed, and was strongly encouraged not to miss any doses under any circumstances. Hb SS with crisis - patient appears to be in crisis with increased chest/rib pain on left and right side as well as in his back, anemia, elevated bilirubin and reticulocytes. He was placed on IV fluids, Dilaudid PCA pump, his pain has improved, was feeling back to normal with a level of 4/10, was discharged home and will need further outpatient follow-up with his primary PCP. Anemia - due to sickle cell, no transfusion needed.  Procedures:  None    Consultations:  Cardiology   Discharge Exam: Filed Vitals:   03/13/14 0424 03/13/14 0558 03/13/14 0800 03/13/14 0932  BP:  111/67  118/75  Pulse:  82  84  Temp:  98.5 F (  36.9 C)    TempSrc:  Oral    Resp: 14 14 17    Height:      Weight:      SpO2: 96% 99% 100%     General: NAD Cardiovascular: RRR Respiratory: CTA biL  Discharge Instructions     Medication List    TAKE these medications        diltiazem 120 MG 24 hr capsule  Commonly known as:  CARDIZEM CD  Take 1 capsule (120 mg total) by mouth daily.     folic acid 1 MG tablet  Commonly known as:  FOLVITE  Take 1 tablet (1 mg total) by mouth every morning.     HYDROmorphone 4 MG  tablet  Commonly known as:  DILAUDID  Take 1 tablet (4 mg total) by mouth every 4 (four) hours as needed for severe pain.     hydroxyurea 500 MG capsule  Commonly known as:  HYDREA  Take 3 capsules (1,500 mg total) by mouth daily. May take with food to minimize GI side effects.     lisinopril 10 MG tablet  Commonly known as:  PRINIVIL,ZESTRIL  Take 1 tablet (10 mg total) by mouth daily.     metoprolol tartrate 25 MG tablet  Commonly known as:  LOPRESSOR  Take 1 tablet (25 mg total) by mouth daily.     morphine 30 MG 12 hr tablet  Commonly known as:  MS CONTIN  Take 1 tablet (30 mg total) by mouth every 12 (twelve) hours.     potassium chloride SA 20 MEQ tablet  Commonly known as:  K-DUR,KLOR-CON  Take 1 tablet (20 mEq total) by mouth every morning.     rivaroxaban 20 MG Tabs tablet  Commonly known as:  XARELTO  Take 1 tablet (20 mg total) by mouth every morning.     Vitamin D 2000 UNITS tablet  Take 1 tablet (2,000 Units total) by mouth daily.     zolpidem 10 MG tablet  Commonly known as:  AMBIEN  Take 1 tablet (10 mg total) by mouth at bedtime as needed for sleep.         The results of significant diagnostics from this hospitalization (including imaging, microbiology, ancillary and laboratory) are listed below for reference.    Significant Diagnostic Studies: Dg Chest 2 View  03/11/2014   CLINICAL DATA:  Acute onset of shortness of breath. Decreased O2 saturation. Generalized weakness and pain. Initial encounter.  EXAM: CHEST  2 VIEW  COMPARISON:  Chest radiograph performed 03/06/2014  FINDINGS: The lungs are well-aerated. Vascular congestion is noted. Minimal bilateral atelectasis is seen. There is no evidence of pleural effusion or pneumothorax.  The heart is mildly enlarged. A left-sided chest port is noted ending about the cavoatrial junction. No acute osseous abnormalities are seen.  IMPRESSION: Vascular congestion and mild cardiomegaly. Minimal bilateral atelectasis  seen.   Electronically Signed   By: Garald Balding M.D.   On: 03/11/2014 06:24   Dg Chest 2 View  03/06/2014   CLINICAL DATA:  Sickle cell crisis, shortness of breath, upper chest pain for 48 hr.  EXAM: CHEST  2 VIEW  COMPARISON:  Chest radiograph February 23, 2014  FINDINGS: The cardiac silhouette is mild-to-moderately enlarged. Pulmonary vascular congestion, increased without pleural effusion or focal consolidation. No pneumothorax.  LEFT chest Port-A-Cath with distal tip projecting cavoatrial junction. No pneumothorax. Soft tissue planes and included osseous structures are nonacute.  IMPRESSION: Mild cardiomegaly and pulmonary vascular congestion without focal consolidation.  Electronically Signed   By: Elon Alas   On: 03/06/2014 03:12   Dg Chest 2 View  02/23/2014   CLINICAL DATA:  Sickle cell crisis, cough, congestion  EXAM: CHEST  2 VIEW  COMPARISON:  02/03/2014  FINDINGS: Borderline cardiomegaly again noted. Stable central mild vascular congestion. No definite superimposed infiltrate or pulmonary edema. Stable left Port-A-Cath position. Bony thorax is stable.  IMPRESSION: Borderline cardiomegaly. Central vascular congestion without convincing pulmonary edema. No segmental infiltrate.   Electronically Signed   By: Lahoma Crocker M.D.   On: 02/23/2014 09:36   Ct Angio Chest Pe W/cm &/or Wo Cm  03/11/2014   CLINICAL DATA:  Sickle cell disease with sudden onset left-sided chest pain.  EXAM: CT ANGIOGRAPHY CHEST WITH CONTRAST  TECHNIQUE: Multidetector CT imaging of the chest was performed using the standard protocol during bolus administration of intravenous contrast. Multiplanar CT image reconstructions and MIPs were obtained to evaluate the vascular anatomy.  CONTRAST:  165mL OMNIPAQUE IOHEXOL 350 MG/ML SOLN  COMPARISON:  01/01/2014  FINDINGS: THORACIC INLET/BODY WALL:  Left subclavian porta catheter, tip in the right atrium.  MEDIASTINUM:  There is multi focal pulmonary embolism with clot primarily  subsegmental but intermittently occlusive. Clot in the posterior segment left upper lobe on image 92, and in the left lower lobe on image 175 and 183 is chronic and without progression from 01/01/2014. The lower lobe clot was previously obscured by artifact, but with the benefit of the current scan was present. There is acute pulmonary embolism to the right upper and right lower lobes, visualized on images 117, 164 and 177.  There is chronic cardiomegaly and right heart enlargement, which limits utility of the RV to LV ratio. The main pulmonary artery is chronically dilated at 37 mm. There is no progression of these findings to suggest acute right heart failure. No pericardial effusion. No aortic dissection. There is chronic mild enlargement of upper mediastinal lymph nodes.  LUNG WINDOWS:  Patchy ground-glass density in the bilateral lungs, with lower lung subsegmental atelectasis or scarring that is stable from prior and likely fixed, chronic lung disease. No lung infarct noted.  UPPER ABDOMEN:  Splenic auto infarction.  OSSEOUS:  Typical osseous changes of sickle cell disease.  Critical Value/emergent results were called by telephone at the time of interpretation on 03/11/2014 at 2:00 pm to Dr. Charlynne Cousins , who verbally acknowledged these results.  Review of the MIP images confirms the above findings.  IMPRESSION: Acute on chronic pulmonary embolism with diffuse subsegmental clot. Chronic pulmonary artery hypertension; no morphologic change to suggest acute right heart failure.   Electronically Signed   By: Monte Fantasia M.D.   On: 03/11/2014 14:08    Microbiology: Recent Results (from the past 240 hour(s))  MRSA PCR Screening     Status: Abnormal   Collection Time: 03/06/14 12:28 PM  Result Value Ref Range Status   MRSA by PCR POSITIVE (A) NEGATIVE Final    Comment:        The GeneXpert MRSA Assay (FDA approved for NASAL specimens only), is one component of a comprehensive MRSA  colonization surveillance program. It is not intended to diagnose MRSA infection nor to guide or monitor treatment for MRSA infections. RESULT CALLED TO, READ BACK BY AND VERIFIED WITH: GAGLIANO,S @1421  ON [id number] BY POTEAT,S      Labs: Basic Metabolic Panel:  Recent Labs Lab 03/08/14 0630 03/09/14 0343 03/11/14 0500 03/12/14 0410 03/13/14 0649  NA 140 140 137 139 137  K 4.0 3.9 3.1* 3.7 4.1  CL 110 111 108 110 109  CO2 21 22 22 22 22   GLUCOSE 111* 113* 121* 135* 102*  BUN 11 12 5* 7 6  CREATININE 0.66 0.55 0.51 0.54 0.41*  CALCIUM 8.8 8.9 8.9 8.4 8.3*   Liver Function Tests:  Recent Labs Lab 03/08/14 0630 03/09/14 0343 03/11/14 0500 03/13/14 0649  AST 44* 42* 44* 35  ALT 34 32 26 20  ALKPHOS 106 97 93 76  BILITOT 6.7* 7.8* 9.4* 5.6*  PROT 7.7 7.5 7.3 7.0  ALBUMIN 4.4 4.0 3.8 3.9   CBC:  Recent Labs Lab 03/08/14 0630 03/09/14 0343 03/11/14 0500 03/12/14 0410 03/13/14 0441  WBC 18.8* 15.5* 23.9* 21.8* 17.0*  NEUTROABS 12.3* 10.9* 19.9*  --   --   HGB 6.8* 6.9* 8.1* 7.3* 7.6*  HCT 19.5* 18.8* 22.7* 20.9* 22.0*  MCV 92.9 94.5 93.0 96.8 97.3  PLT 374 386 411* 390 395   Cardiac Enzymes:  Recent Labs Lab 03/11/14 0500 03/12/14 1230  TROPONINI 1.26* 1.63*   ProBNP (last 3 results)  Recent Labs  01/01/14 0909  PROBNP 5790.0*    CBG:  Recent Labs Lab 03/06/14 1755  GLUCAP 86       Signed:  GHERGHE, COSTIN  [facility] 03/13/2014, 4:11 PM

## 2014-03-14 ENCOUNTER — Inpatient Hospital Stay (HOSPITAL_COMMUNITY): Payer: Medicare Other

## 2014-03-14 ENCOUNTER — Inpatient Hospital Stay (HOSPITAL_COMMUNITY)
Admission: EM | Admit: 2014-03-14 | Discharge: 2014-03-24 | DRG: 175 | Disposition: A | Discharge status: Home / Self Care | Payer: Medicare Other | Attending: Internal Medicine | Admitting: Internal Medicine

## 2014-03-14 ENCOUNTER — Emergency Department (HOSPITAL_COMMUNITY): Payer: Medicare Other

## 2014-03-14 ENCOUNTER — Encounter (HOSPITAL_COMMUNITY): Payer: Self-pay

## 2014-03-14 DIAGNOSIS — J99 Respiratory disorders in diseases classified elsewhere: Secondary | ICD-10-CM | POA: Diagnosis present

## 2014-03-14 DIAGNOSIS — Z87891 Personal history of nicotine dependence: Secondary | ICD-10-CM

## 2014-03-14 DIAGNOSIS — R4781 Slurred speech: Secondary | ICD-10-CM | POA: Diagnosis not present

## 2014-03-14 DIAGNOSIS — E559 Vitamin D deficiency, unspecified: Secondary | ICD-10-CM | POA: Diagnosis present

## 2014-03-14 DIAGNOSIS — I5022 Chronic systolic (congestive) heart failure: Secondary | ICD-10-CM | POA: Diagnosis present

## 2014-03-14 DIAGNOSIS — Z9119 Patient's noncompliance with other medical treatment and regimen: Secondary | ICD-10-CM | POA: Diagnosis present

## 2014-03-14 DIAGNOSIS — I6782 Cerebral ischemia: Secondary | ICD-10-CM | POA: Diagnosis not present

## 2014-03-14 DIAGNOSIS — R590 Localized enlarged lymph nodes: Secondary | ICD-10-CM | POA: Diagnosis not present

## 2014-03-14 DIAGNOSIS — R404 Transient alteration of awareness: Secondary | ICD-10-CM | POA: Diagnosis not present

## 2014-03-14 DIAGNOSIS — Z79899 Other long term (current) drug therapy: Secondary | ICD-10-CM

## 2014-03-14 DIAGNOSIS — R42 Dizziness and giddiness: Secondary | ICD-10-CM | POA: Diagnosis not present

## 2014-03-14 DIAGNOSIS — I313 Pericardial effusion (noninflammatory): Secondary | ICD-10-CM | POA: Diagnosis not present

## 2014-03-14 DIAGNOSIS — I2699 Other pulmonary embolism without acute cor pulmonale: Secondary | ICD-10-CM | POA: Diagnosis not present

## 2014-03-14 DIAGNOSIS — I10 Essential (primary) hypertension: Secondary | ICD-10-CM | POA: Diagnosis present

## 2014-03-14 DIAGNOSIS — Z7901 Long term (current) use of anticoagulants: Secondary | ICD-10-CM

## 2014-03-14 DIAGNOSIS — R0600 Dyspnea, unspecified: Secondary | ICD-10-CM

## 2014-03-14 DIAGNOSIS — R4702 Dysphasia: Secondary | ICD-10-CM | POA: Diagnosis not present

## 2014-03-14 DIAGNOSIS — I2489 Other forms of acute ischemic heart disease: Secondary | ICD-10-CM | POA: Diagnosis present

## 2014-03-14 DIAGNOSIS — Z96641 Presence of right artificial hip joint: Secondary | ICD-10-CM | POA: Diagnosis present

## 2014-03-14 DIAGNOSIS — Z79891 Long term (current) use of opiate analgesic: Secondary | ICD-10-CM

## 2014-03-14 DIAGNOSIS — R531 Weakness: Secondary | ICD-10-CM | POA: Diagnosis not present

## 2014-03-14 DIAGNOSIS — I2781 Cor pulmonale (chronic): Secondary | ICD-10-CM | POA: Diagnosis present

## 2014-03-14 DIAGNOSIS — K117 Disturbances of salivary secretion: Secondary | ICD-10-CM

## 2014-03-14 DIAGNOSIS — R0602 Shortness of breath: Secondary | ICD-10-CM

## 2014-03-14 DIAGNOSIS — D5700 Hb-SS disease with crisis, unspecified: Secondary | ICD-10-CM | POA: Diagnosis not present

## 2014-03-14 DIAGNOSIS — I272 Other secondary pulmonary hypertension: Secondary | ICD-10-CM | POA: Diagnosis not present

## 2014-03-14 DIAGNOSIS — R509 Fever, unspecified: Secondary | ICD-10-CM | POA: Diagnosis not present

## 2014-03-14 DIAGNOSIS — R0902 Hypoxemia: Secondary | ICD-10-CM

## 2014-03-14 DIAGNOSIS — J9611 Chronic respiratory failure with hypoxia: Secondary | ICD-10-CM | POA: Diagnosis present

## 2014-03-14 DIAGNOSIS — R29898 Other symptoms and signs involving the musculoskeletal system: Secondary | ICD-10-CM

## 2014-03-14 DIAGNOSIS — D5701 Hb-SS disease with acute chest syndrome: Secondary | ICD-10-CM

## 2014-03-14 DIAGNOSIS — E876 Hypokalemia: Secondary | ICD-10-CM | POA: Diagnosis present

## 2014-03-14 DIAGNOSIS — Z9114 Patient's other noncompliance with medication regimen: Secondary | ICD-10-CM | POA: Diagnosis present

## 2014-03-14 DIAGNOSIS — J9621 Acute and chronic respiratory failure with hypoxia: Secondary | ICD-10-CM | POA: Diagnosis not present

## 2014-03-14 DIAGNOSIS — I2782 Chronic pulmonary embolism: Secondary | ICD-10-CM | POA: Diagnosis present

## 2014-03-14 DIAGNOSIS — Y95 Nosocomial condition: Secondary | ICD-10-CM | POA: Diagnosis not present

## 2014-03-14 DIAGNOSIS — R079 Chest pain, unspecified: Secondary | ICD-10-CM | POA: Diagnosis not present

## 2014-03-14 DIAGNOSIS — J90 Pleural effusion, not elsewhere classified: Secondary | ICD-10-CM | POA: Diagnosis not present

## 2014-03-14 DIAGNOSIS — I248 Other forms of acute ischemic heart disease: Secondary | ICD-10-CM | POA: Diagnosis present

## 2014-03-14 DIAGNOSIS — D571 Sickle-cell disease without crisis: Secondary | ICD-10-CM | POA: Diagnosis not present

## 2014-03-14 DIAGNOSIS — I517 Cardiomegaly: Secondary | ICD-10-CM | POA: Diagnosis not present

## 2014-03-14 DIAGNOSIS — J189 Pneumonia, unspecified organism: Secondary | ICD-10-CM | POA: Diagnosis not present

## 2014-03-14 DIAGNOSIS — I4891 Unspecified atrial fibrillation: Secondary | ICD-10-CM

## 2014-03-14 DIAGNOSIS — I2721 Secondary pulmonary arterial hypertension: Secondary | ICD-10-CM | POA: Diagnosis present

## 2014-03-14 LAB — HEPATIC FUNCTION PANEL
ALT: 20 U/L (ref 0–53)
AST: 46 U/L — ABNORMAL HIGH (ref 0–37)
Albumin: 3.8 g/dL (ref 3.5–5.2)
Alkaline Phosphatase: 81 U/L (ref 39–117)
Bilirubin, Direct: 0.5 mg/dL (ref 0.0–0.5)
Indirect Bilirubin: 4.6 mg/dL — ABNORMAL HIGH (ref 0.3–0.9)
Total Bilirubin: 5.1 mg/dL — ABNORMAL HIGH (ref 0.3–1.2)
Total Protein: 7.4 g/dL (ref 6.0–8.3)

## 2014-03-14 LAB — APTT: aPTT: 35 seconds (ref 24–37)

## 2014-03-14 LAB — BASIC METABOLIC PANEL
Anion gap: 5 (ref 5–15)
BUN: 6 mg/dL (ref 6–23)
CALCIUM: 8.5 mg/dL (ref 8.4–10.5)
CO2: 22 mmol/L (ref 19–32)
Chloride: 108 mmol/L (ref 96–112)
Creatinine, Ser: 0.47 mg/dL — ABNORMAL LOW (ref 0.50–1.35)
GFR calc Af Amer: >90 mL/min (ref >90)
GFR calc non Af Amer: >90 mL/min (ref >90)
Glucose, Bld: 119 mg/dL — ABNORMAL HIGH (ref 70–99)
Potassium: 3.7 mmol/L (ref 3.5–5.1)
Sodium: 135 mmol/L (ref 135–145)

## 2014-03-14 LAB — HEPARIN LEVEL (UNFRACTIONATED): Heparin Unfractionated: <0.1 [IU]/mL — ABNORMAL LOW (ref 0.30–0.70)

## 2014-03-14 LAB — CBC
HEMATOCRIT: 21.3 % — AB (ref 39.0–52.0)
Hemoglobin: 7.5 g/dL — ABNORMAL LOW (ref 13.0–17.0)
MCH: 33.8 pg (ref 26.0–34.0)
MCHC: 35.2 g/dL (ref 30.0–36.0)
MCV: 95.9 fL (ref 78.0–100.0)
Platelets: 381 10*3/uL (ref 150–400)
RBC: 2.22 MIL/uL — AB (ref 4.22–5.81)
RDW: 24.6 % — ABNORMAL HIGH (ref 11.5–15.5)
WBC: 14.6 10*3/uL — AB (ref 4.0–10.5)

## 2014-03-14 LAB — PREPARE RBC (CROSSMATCH)

## 2014-03-14 LAB — PROTIME-INR
INR: 1.24 (ref 0.00–1.49)
Prothrombin Time: 15.7 seconds — ABNORMAL HIGH (ref 11.6–15.2)

## 2014-03-14 LAB — I-STAT TROPONIN, ED: TROPONIN I, POC: 2.59 ng/mL — AB (ref 0.00–0.08)

## 2014-03-14 MED ORDER — MORPHINE SULFATE ER 30 MG PO TBCR
30.0000 mg | EXTENDED_RELEASE_TABLET | Freq: Two times a day (BID) | ORAL | Status: DC
  Administered 2014-03-14 – 2014-03-24 (×20): 30 mg via ORAL
  Filled 2014-03-14 (×20): qty 1

## 2014-03-14 MED ORDER — ENOXAPARIN SODIUM 80 MG/0.8ML ~~LOC~~ SOLN: 1.0000 mg/kg | Freq: Once | SUBCUTANEOUS | Status: DC

## 2014-03-14 MED ORDER — METOPROLOL TARTRATE 25 MG PO TABS
25.0000 mg | ORAL_TABLET | Freq: Every day | ORAL | Status: DC
  Administered 2014-03-15 – 2014-03-24 (×10): 25 mg via ORAL
  Filled 2014-03-14 (×10): qty 1

## 2014-03-14 MED ORDER — KETOROLAC TROMETHAMINE 30 MG/ML IJ SOLN
30.0000 mg | Freq: Four times a day (QID) | INTRAMUSCULAR | Status: AC
  Administered 2014-03-14 – 2014-03-19 (×20): 30 mg via INTRAVENOUS
  Filled 2014-03-14 (×23): qty 1

## 2014-03-14 MED ORDER — HEPARIN (PORCINE) IN NACL 100-0.45 UNIT/ML-% IJ SOLN
1050.0000 [IU]/h | INTRAMUSCULAR | Status: DC
  Administered 2014-03-15: 1050 [IU]/h via INTRAVENOUS
  Filled 2014-03-14: qty 250

## 2014-03-14 MED ORDER — DIPHENHYDRAMINE HCL 50 MG/ML IJ SOLN
25.0000 mg | Freq: Once | INTRAMUSCULAR | Status: AC
  Administered 2014-03-14: 25 mg via INTRAVENOUS
  Filled 2014-03-14: qty 1

## 2014-03-14 MED ORDER — HYDROMORPHONE HCL 1 MG/ML IJ SOLN: 1.0000 mg | Freq: Once | INTRAMUSCULAR | Status: DC

## 2014-03-14 MED ORDER — SODIUM CHLORIDE 0.9 % IV SOLN
Freq: Once | INTRAVENOUS | Status: AC
  Administered 2014-03-14: 20:00:00 via INTRAVENOUS

## 2014-03-14 MED ORDER — TECHNETIUM TC 99M DIETHYLENETRIAME-PENTAACETIC ACID: 35.3000 | Freq: Once | INTRAVENOUS | Status: AC | PRN

## 2014-03-14 MED ORDER — TECHNETIUM TO 99M ALBUMIN AGGREGATED
5.3000 | Freq: Once | INTRAVENOUS | Status: AC | PRN
  Administered 2014-03-14: 5 via INTRAVENOUS

## 2014-03-14 MED ORDER — POTASSIUM CHLORIDE CRYS ER 20 MEQ PO TBCR
20.0000 meq | EXTENDED_RELEASE_TABLET | Freq: Every morning | ORAL | Status: DC
  Administered 2014-03-15 – 2014-03-24 (×10): 20 meq via ORAL
  Filled 2014-03-14 (×11): qty 1

## 2014-03-14 MED ORDER — ONDANSETRON HCL 4 MG/2ML IJ SOLN: 4.0000 mg | Freq: Four times a day (QID) | INTRAMUSCULAR | Status: DC | PRN

## 2014-03-14 MED ORDER — VITAMIN D3 25 MCG (1000 UNIT) PO TABS
2000.0000 [IU] | ORAL_TABLET | Freq: Every day | ORAL | Status: DC
  Administered 2014-03-15 – 2014-03-24 (×9): 2000 [IU] via ORAL
  Filled 2014-03-14 (×10): qty 2

## 2014-03-14 MED ORDER — HYDROMORPHONE HCL 2 MG/ML IJ SOLN
2.0000 mg | Freq: Once | INTRAMUSCULAR | Status: AC
  Administered 2014-03-14: 2 mg via INTRAVENOUS
  Filled 2014-03-14: qty 1

## 2014-03-14 MED ORDER — DEXTROSE-NACL 5-0.45 % IV SOLN
INTRAVENOUS | Status: DC
  Administered 2014-03-14 – 2014-03-17 (×5): via INTRAVENOUS

## 2014-03-14 MED ORDER — LISINOPRIL 10 MG PO TABS
10.0000 mg | ORAL_TABLET | Freq: Every day | ORAL | Status: DC
  Administered 2014-03-15 – 2014-03-24 (×10): 10 mg via ORAL
  Filled 2014-03-14 (×10): qty 1

## 2014-03-14 MED ORDER — HYDROMORPHONE HCL 1 MG/ML IJ SOLN
1.0000 mg | Freq: Once | INTRAMUSCULAR | Status: AC
  Administered 2014-03-14: 1 mg via INTRAVENOUS
  Filled 2014-03-14: qty 1

## 2014-03-14 MED ORDER — NALOXONE HCL 0.4 MG/ML IJ SOLN: 0.4000 mg | INTRAMUSCULAR | Status: DC | PRN

## 2014-03-14 MED ORDER — FOLIC ACID 1 MG PO TABS
1.0000 mg | ORAL_TABLET | Freq: Every morning | ORAL | Status: DC
  Administered 2014-03-15 – 2014-03-24 (×10): 1 mg via ORAL
  Filled 2014-03-14 (×10): qty 1

## 2014-03-14 MED ORDER — SODIUM CHLORIDE 0.9 % IV SOLN
12.5000 mg | Freq: Four times a day (QID) | INTRAVENOUS | Status: DC | PRN
  Administered 2014-03-15 – 2014-03-24 (×21): 12.5 mg via INTRAVENOUS
  Filled 2014-03-14 (×45): qty 0.25

## 2014-03-14 MED ORDER — DILTIAZEM HCL ER COATED BEADS 120 MG PO CP24
120.0000 mg | ORAL_CAPSULE | Freq: Every day | ORAL | Status: DC
  Administered 2014-03-15 – 2014-03-24 (×10): 120 mg via ORAL
  Filled 2014-03-14 (×10): qty 1

## 2014-03-14 MED ORDER — HYDROMORPHONE 2 MG/ML HIGH CONCENTRATION IV PCA SOLN
INTRAVENOUS | Status: DC
  Administered 2014-03-14: 21:00:00 via INTRAVENOUS
  Administered 2014-03-15: 1.4 mg via INTRAVENOUS
  Administered 2014-03-15: 5.6 mg via INTRAVENOUS
  Administered 2014-03-15: 9.1 mg via INTRAVENOUS
  Administered 2014-03-15: 0.7 mL via INTRAVENOUS
  Administered 2014-03-15: 8.4 mg via INTRAVENOUS
  Administered 2014-03-15: 4.2 mg via INTRAVENOUS
  Administered 2014-03-16: 7 mg via INTRAVENOUS
  Administered 2014-03-16: 5.6 mg via INTRAVENOUS
  Administered 2014-03-16: 9.1 mg via INTRAVENOUS
  Administered 2014-03-16 (×2): 7 mg via INTRAVENOUS
  Administered 2014-03-16: 5.6 mg via INTRAVENOUS
  Administered 2014-03-16: 7 mg via INTRAVENOUS
  Administered 2014-03-17: 11.2 mg via INTRAVENOUS
  Administered 2014-03-17: 4.9 mg via INTRAVENOUS
  Administered 2014-03-17: 02:00:00 via INTRAVENOUS
  Administered 2014-03-17: 12.6 mg via INTRAVENOUS
  Administered 2014-03-17: 3.5 mg via INTRAVENOUS
  Administered 2014-03-18 (×2): 4.9 mg via INTRAVENOUS
  Administered 2014-03-18: 2.8 mg via INTRAVENOUS
  Administered 2014-03-18: 7.9 mg via INTRAVENOUS
  Administered 2014-03-18: 10:00:00 via INTRAVENOUS
  Administered 2014-03-18: 9.3 mg via INTRAVENOUS
  Administered 2014-03-18 (×2): 7.7 mg via INTRAVENOUS
  Administered 2014-03-19: 17:00:00 via INTRAVENOUS
  Administered 2014-03-19: 12.6 mg via INTRAVENOUS
  Administered 2014-03-19: 8.4 mg via INTRAVENOUS
  Administered 2014-03-19: 3.5 mg via INTRAVENOUS
  Administered 2014-03-19: 2.8 mg via INTRAVENOUS
  Administered 2014-03-20: 1.4 mg via INTRAVENOUS
  Administered 2014-03-20: 7.6 mg via INTRAVENOUS
  Administered 2014-03-20: 5.6 mg via INTRAVENOUS
  Administered 2014-03-20: 9.8 mg via INTRAVENOUS
  Administered 2014-03-20: 14.7 mg via INTRAVENOUS
  Administered 2014-03-21: 7 mg via INTRAVENOUS
  Administered 2014-03-21: 3.5 mg via INTRAVENOUS
  Administered 2014-03-21: 8.4 mg via INTRAVENOUS
  Administered 2014-03-21: 2.8 mg via INTRAVENOUS
  Filled 2014-03-14 (×6): qty 25

## 2014-03-14 MED ORDER — SODIUM CHLORIDE 0.9 % IJ SOLN: 9.0000 mL | INTRAMUSCULAR | Status: DC | PRN

## 2014-03-14 MED ORDER — HYDROXYUREA 500 MG PO CAPS
1500.0000 mg | ORAL_CAPSULE | Freq: Every day | ORAL | Status: DC
  Administered 2014-03-15 – 2014-03-24 (×10): 1500 mg via ORAL
  Filled 2014-03-14 (×11): qty 3

## 2014-03-14 MED ORDER — DIPHENHYDRAMINE HCL 12.5 MG/5ML PO ELIX
12.5000 mg | ORAL_SOLUTION | Freq: Four times a day (QID) | ORAL | Status: DC | PRN
  Administered 2014-03-14 – 2014-03-15 (×2): 12.5 mg via ORAL
  Filled 2014-03-14 (×9): qty 5

## 2014-03-14 NOTE — ED Notes (Signed)
Per EMS, pt from home.  Pt states went to bed last night and felt fine.  Woke up this morning at 3am and has been dizzy since. Pt states he feels weak.  Neuro WNL.  Vitals: 140/95, hr 90, nsr, 89% ra.  100% 2l per [place].  cbg 215

## 2014-03-14 NOTE — Progress Notes (Signed)
Did not show for appt.   Gerald Furbish, MD

## 2014-03-14 NOTE — H&P (Signed)
[name] History and Physical  [name] PTW:[id number] DOB: 02/20/1980 DOA: 03/14/2014   PCP: [name],[name], MD   Chief Complaint: pain  HPI: [name] is a 34yo male with sickle cell disease who presented with complaints of SOB. He was recently hospitalized from 2/9-2/11 for acute on chronic PE seen on CT angio from 2/9. He was sent home yesterday, and reports doing well initially but around 3am this morning he woke up feeling SOB. He then went back to sleep but arose this morning still feeling SOB, dizzy (lightheaded), and having worsening chest and back pain. He then came the ED. Pt states he has taken all of his meds including Xarelto. He denies any fever, cough, nausea, vomiting, heart palpitation, weakness, or LOC.  In the ED, he was hypoxic down to 85% o2 sat. He was placed on 5LPM via nasal cannula. A CXR was performed showing no acute opacities. Troponin was 2.59 and Hgb of 7.5. He was given a few doses of IV Dilaudid for his pain.  Review of Systems:  Constitutional: No weight loss, night sweats, fatigue.  HEENT: No headaches seizures, vision changes, difficulty swallowing,Tooth/dental problems,Sore throat, No sneezing, itching, ear ache, nasal congestion, post nasal drip,  Cardio-vascular: +chest pain, No orthopnea, PND, swelling in lower extremities, anasarca, dizziness, palpitations  GI: No heartburn, indigestion, abdominal pain, nausea, vomiting, diarrhea, change in bowel habits, loss of appetite  Resp: +shortness of breath at rest. No excess mucus, no productive cough. No coughing up of blood.No change in color of mucus.No wheezing.No chest wall deformity  Skin: no new lesions or rash GU: no dysuria, change in color of urine, no urgency or frequency. No flank pain.  Musculoskeletal: No joint pain or swelling. No decreased range of motion. + back pain.  Psych: No change in mood or affect. No depression or anxiety. No memory loss.    Past  Medical History  Diagnosis Date  . Sickle cell anemia   . Blood transfusion   . Acute embolism and thrombosis of right internal jugular vein   . Hypokalemia   . Mood disorder   . History of pulmonary embolus (PE)   . Avascular necrosis   . Leukocytosis     Chronic  . Thrombocytosis     Chronic  . Hypertension   . History of Clostridium difficile infection   . Uses marijuana   . Chronic anticoagulation   . Functional asplenia   . Former smoker   . Second hand tobacco smoke exposure   . Alcohol consumption of one to four drinks per day   . Noncompliance with medication regimen   . Sickle-cell crisis with associated acute chest syndrome 05/13/2013  . Acute chest syndrome 06/18/2013  . Demand ischemia 01/02/2014   Past Surgical History  Procedure Laterality Date  . Right hip replacement      08/2006  . Cholecystectomy      01/2008  . Porta cath placement    . Porta cath removal    . Umbilical hernia repair      01/2008  . Excision of left periauricular cyst      10/2009  . Excision of right ear lobe cyst with primary closur      11/2007  . Portacath placement  01/05/2012    Procedure: INSERTION PORT-A-CATH;  Surgeon: Odis Hollingshead, MD;  Location: [facility];  Service: General;  Laterality: N/A;  ultrasound guiced port a cath insertion with fluoroscopy   Social History:  reports  that he quit smoking about 3 years ago. He has never used smokeless tobacco. He reports that he drinks about 1.2 oz of alcohol per week. He reports that he uses illicit drugs (Marijuana) about twice per week.  No Known Allergies  Family History  Problem Relation Age of Onset  . Sickle cell trait Mother   . Depression Mother   . Diabetes Mother   . Sickle cell trait Father   . Sickle cell trait Brother    No current facility-administered medications on file prior to encounter.   Current Outpatient Prescriptions on File Prior to Encounter  Medication Sig Dispense Refill  . Cholecalciferol (VITAMIN  D) 2000 UNITS tablet Take 1 tablet (2,000 Units total) by mouth daily. 90 tablet 3  . diltiazem (CARDIZEM CD) 120 MG 24 hr capsule Take 1 capsule (120 mg total) by mouth daily. 30 capsule 1  . folic acid (FOLVITE) 1 MG tablet Take 1 tablet (1 mg total) by mouth every morning. 30 tablet 11  . HYDROmorphone (DILAUDID) 4 MG tablet Take 1 tablet (4 mg total) by mouth every 4 (four) hours as needed for severe pain. 20 tablet 0  . hydroxyurea (HYDREA) 500 MG capsule Take 3 capsules (1,500 mg total) by mouth daily. May take with food to minimize GI side effects. 60 capsule 3  . lisinopril (PRINIVIL,ZESTRIL) 10 MG tablet Take 1 tablet (10 mg total) by mouth daily. 30 tablet 1  . metoprolol tartrate (LOPRESSOR) 25 MG tablet Take 1 tablet (25 mg total) by mouth daily. 30 tablet 6  . morphine (MS CONTIN) 30 MG 12 hr tablet Take 1 tablet (30 mg total) by mouth every 12 (twelve) hours. 60 tablet 0  . potassium chloride SA (K-DUR,KLOR-CON) 20 MEQ tablet Take 1 tablet (20 mEq total) by mouth every morning. 30 tablet 3  . rivaroxaban (XARELTO) 20 MG TABS tablet Take 1 tablet (20 mg total) by mouth every morning. 30 tablet 2  . zolpidem (AMBIEN) 10 MG tablet Take 1 tablet (10 mg total) by mouth at bedtime as needed for sleep. 30 tablet 3    Physical Exam: Filed Vitals:   03/14/14 1532 03/14/14 1634  BP: 136/84 123/76  Pulse: 90 85  Temp: 98.3 F (36.8 C) 98.8 F (37.1 C)  TempSrc:  Oral  Resp: 22 20  SpO2: 95% 97%   General: Alert, awake, oriented x3, mild distress and discomfort.  HEENT: [place]/AT PEERL, EOMI, 1+scleral icterus Neck: Trachea midline,  no masses, no thyromegaly no JVD, no carotid bruit, no lymphadenopathy OROPHARYNX:  Moist, No exudate/ erythema/lesions.  Heart: tachycardic and regular rhythm, without audible murmur Lungs: Clear to auscultation, no wheezing or rhonchi noted. No increased vocal fremitus resonant to percussion  Abdomen: Soft, nontender, nondistended, positive bowel sounds,  no masses no hepatosplenomegaly noted. Neuro: No focal neurological deficits noted cranial nerves II through XII grossly intact. MSK: no warmth, swelling, or erythema Extremities: mild clubbing of fingers, no cyanosis, or edema.   Labs on Admission:   Basic Metabolic Panel:  Recent Labs Lab 03/09/14 0343 03/11/14 0500 03/12/14 0410 03/13/14 0649 03/14/14 1332  NA 140 137 139 137 135  K 3.9 3.1* 3.7 4.1 3.7  CL 111 108 110 109 108  CO2 22 22 22 22 22   GLUCOSE 113* 121* 135* 102* 119*  BUN 12 5* 7 6 6   CREATININE 0.55 0.51 0.54 0.41* 0.47*  CALCIUM 8.9 8.9 8.4 8.3* 8.5   Liver Function Tests:  Recent Labs Lab 03/08/14 0630 03/09/14 0343  03/11/14 0500 03/13/14 0649  AST 44* 42* 44* 35  ALT 34 32 26 20  ALKPHOS 106 97 93 76  BILITOT 6.7* 7.8* 9.4* 5.6*  PROT 7.7 7.5 7.3 7.0  ALBUMIN 4.4 4.0 3.8 3.9   CBC:  Recent Labs Lab 03/08/14 0630 03/09/14 0343 03/11/14 0500 03/12/14 0410 03/13/14 0441 03/14/14 1332  WBC 18.8* 15.5* 23.9* 21.8* 17.0* 14.6*  NEUTROABS 12.3* 10.9* 19.9*  --   --   --   HGB 6.8* 6.9* 8.1* 7.3* 7.6* 7.5*  HCT 19.5* 18.8* 22.7* 20.9* 22.0* 21.3*  MCV 92.9 94.5 93.0 96.8 97.3 95.9  PLT 374 386 411* 390 395 381   BNP: Invalid input(s): POCBNP   Radiological Exams on Admission: Dg Chest 2 View  03/14/2014   CLINICAL DATA:  Dizzy and weak, history of sickle cell  EXAM: CHEST  2 VIEW  COMPARISON:  03/11/2014  FINDINGS: Cardiac shadow remains enlarged. A left chest wall port is again seen. The lungs are clear without focal infiltrate. The bony structures are within normal limits.  IMPRESSION: No acute abnormality is noted. The patient does have a recent diagnosis of acute on chronic pulmonary embolism on 03/11/2014   Electronically Signed   By: Inez Catalina M.D.   On: 03/14/2014 14:58     Assessment/Plan: Active Problems:   Vitamin D deficiency   Hypokalemia   Pulmonary embolus   Embolism, pulmonary with infarction   Hb-SS disease with  crisis   Demand ischemia of myocardium   Sickle cell pain crisis   Hypoxia 1. Respiratory: Acute on Chronic PE likely causing his hypoxia. Pt was being treated for acute on chronic PE while inpatient and was discharged yesterday. He presents again with acute hypoxia.         -Will start him on a heparin drip. Pharmacy consult placed. -Hold Xarelto until more stable. Will restart Xarelto at therapeutic PE dose of 15mg  twice daily tomorrow if he is stable.  -Obtain V/Q scan to assess thrombus and perfusion.  -Curbsided critical care who recommended consult, will comply.  -Pt also has pulmonary HTN that he has not required oxygen for, but has not been seen in [place] clinic. -Supplemental O2 as needed to keep O2 sats above 95%. 2. Cardiology: Troponin of 2.59 likely due to demand ischemia. EKG with no ST elevations or arrythmias. Pt previously had Afib with RVR that resolved during his last admission.  -Monitor on tele.  -He has right heart enlargement and strain seen on previous TTE. Will repeat TTE to assess if right heart strain is worsening and if he is developing right heart failure. Will have cardiology look at results once obtained.  -Continue diltiazem -HTN is controlled. Continue home Lisinopril and Metoprolol 3. Heme:  -Sickle Cell Anemia with crisis: Start on high dose Dilaudid PCA and Toradol. Continue MS contin for long acting.  -Anemia: Hgb of 7.5, at baseline. Due to worsening respiratory status, will transfuse him 2 units now, goal of 10.  -Continue his routine sickle cell care with folic acid and hydroxyurea.  4. Leukocytosis: Likely secondary to crisis. Will monitor.    5. FEN/GI :D5/0.45% NS. Continue KDur for h/o hypokalemia and vitD for his vitD deficiency. NPO for now, but if he continues to improve, will start him on a regular diet.  Bowel regimen in place  Code Status: Full code DVT Prophylaxis: on Heparin gtt Family Communication: none Disposition Plan: pending  improvement   Kalman Shan, MD  Pager [phone]  If 7PM-7AM,  please contact night-coverage [email or web address] Password [facility] 03/14/2014, 5:45 PM

## 2014-03-14 NOTE — Progress Notes (Signed)
  CARE MANAGEMENT ED NOTE 03/14/2014  Patient:  [name], [name]   Account Number:  [id number]  Date Initiated:  03/14/2014  Documentation initiated by:  Livia Snellen  Subjective/Objective Assessment:   Patient presents to Ed with dizziness and hypoxia     Subjective/Objective Assessment Detail:   Patient with pmhx of sickle cell disease.  Patient with recent admission from 02/09 to 02/11 for Afib with RVR, persistent pulmonary emnbolus.  Per chart review patient admits to missing a few doses.  Patient noted to be seen in the Ed  8 times within the last six months with 21 admissions.    Pulse ox 84-85% on room air placed on 5 liters of oxygen in the ED.  Troponin 2.59 hmg 7.5 WBX 14.6     Action/Plan:   Action/Plan Detail:   Anticipated DC Date:       Status Recommendation to Physician:   Result of Recommendation:    Other ED University  Other  PCP issues    Choice offered to / List presented to:            Status of service:  Completed, signed off  ED Comments:   ED Comments Detail:  [facility] consulted by Dr. Exie Parody requesting assistance with home oxygen.  [facility] informed Dr. Exie Parody that a patient cannot be qualifyed for oxygen from the ED.  EDCM called [facility] DME rep to confirm.  Discussed patient with EDP who reports is being admitted through sickle cell physician. No further EDCM needs at this time.

## 2014-03-14 NOTE — Progress Notes (Signed)
ANTICOAGULATION CONSULT NOTE - Initial Consult  Pharmacy Consult for heparin Indication: pulmonary embolus  No Known Allergies  Patient Measurements: Weight= 73kg on 03/11/14 IBW= 77.6kg Heparin Dosing Weight: 73kg  Vital Signs: Temp: 98.8 F (37.1 C) (02/12 1634) Temp Source: Oral (02/12 1634) BP: 123/76 mmHg (02/12 1634) Pulse Rate: 85 (02/12 1634)  Labs:  Recent Labs  03/12/14 0410 03/12/14 1230 03/13/14 0441 03/13/14 0649 03/14/14 1332  HGB 7.3*  --  7.6*  --  7.5*  HCT 20.9*  --  22.0*  --  21.3*  PLT 390  --  395  --  381  CREATININE 0.54  --   --  0.41* 0.47*  TROPONINI  --  1.63*  --   --   --     Estimated Creatinine Clearance: 134.3 mL/min (by C-G formula based on Cr of 0.47).   Medical History: Past Medical History  Diagnosis Date  . Sickle cell anemia   . Blood transfusion   . Acute embolism and thrombosis of right internal jugular vein   . Hypokalemia   . Mood disorder   . History of pulmonary embolus (PE)   . Avascular necrosis   . Leukocytosis     Chronic  . Thrombocytosis     Chronic  . Hypertension   . History of Clostridium difficile infection   . Uses marijuana   . Chronic anticoagulation   . Functional asplenia   . Former smoker   . Second hand tobacco smoke exposure   . Alcohol consumption of one to four drinks per day   . Noncompliance with medication regimen   . Sickle-cell crisis with associated acute chest syndrome 05/13/2013  . Acute chest syndrome 06/18/2013  . Demand ischemia 01/02/2014    Assessment: 40 yoM with Hx sickle cell disease with frequent crisis and admissions, on Xarelto PTA for chronic pulmonary embolism is admitted 2/12 with dizziness. Patient admitted 2/4-2/7 for sickle cell pain, and 2/9-2/11 for sickle cell pain with CP and elevated troponins. Pt found to have acute on chronic pulmonary embolism on CT angio during this past admission. Patient was discharged back on Xarelto. VQ scan performed today shows no new  PE on that study. Pharmacy is consulted to dose heparin gtt for pulmonary embolism.   Patient reoprted some noncompliance to Xarelto during previous admission  Patient reports last dose of Xarelto taken 2/12 am  Hgb 7.5 on admisson, close to patient's baseline  Platelets WNL  troponins elevated to 2.59  Baseline anti-Xa level undetectable  Baseline aPTT is WNL at 35sec  Baseline INR WNL at 1.24  No bleeding has been noted  Under normal circumstances would wait to start heparin gtt 24 hours post last Xarelto dose  Due to baseline anti-Xa, aPTT, and INR all being WNL will go ahead and start heparin gtt tonight without bolus  Noted sickle cell MD with tentative plans to start treatment dose Xarelto on 2/13 depending on patient status.   Goal of Therapy:  Heparin level 0.3-0.7 units/ml aPTT 66-102 seconds Monitor platelets by anticoagulation protocol: Yes   Plan:  - heparin gtt at 1050 units/hr - 6 hour heparin level and aPTT - daily heparin level and CBC - follow-up plans for Xarelto restart  Thank you for the consult.  Currie Paris, PharmD, BCPS Pager: [phone] Pharmacy: [phone] 03/14/2014 9:26 PM

## 2014-03-14 NOTE — ED Notes (Signed)
Pt transferred to [facility].  Sickle Cell MD at bedside to update patient.

## 2014-03-14 NOTE — ED Provider Notes (Signed)
CSN: [id number]     Arrival date & time 03/14/14  1233 History   First MD Initiated Contact with Patient 03/14/14 1251     Chief Complaint  Patient presents with  . Dizziness     (Consider location/radiation/quality/duration/timing/severity/associated sxs/prior Treatment) Patient is a 34 y.o. male presenting with sickle cell pain. The history is provided by the patient.  Sickle Cell Pain Crisis Location:  Chest Severity:  Moderate Onset quality:  Gradual Similar to previous crisis episodes: yes   Timing:  Constant Progression:  Worsening Chronicity:  Chronic Sickle cell genotype:  SS Associated symptoms: chest pain   Associated symptoms: no cough, no fever and no shortness of breath     Past Medical History  Diagnosis Date  . Sickle cell anemia   . Blood transfusion   . Acute embolism and thrombosis of right internal jugular vein   . Hypokalemia   . Mood disorder   . History of pulmonary embolus (PE)   . Avascular necrosis   . Leukocytosis     Chronic  . Thrombocytosis     Chronic  . Hypertension   . History of Clostridium difficile infection   . Uses marijuana   . Chronic anticoagulation   . Functional asplenia   . Former smoker   . Second hand tobacco smoke exposure   . Alcohol consumption of one to four drinks per day   . Noncompliance with medication regimen   . Sickle-cell crisis with associated acute chest syndrome 05/13/2013  . Acute chest syndrome 06/18/2013  . Demand ischemia 01/02/2014   Past Surgical History  Procedure Laterality Date  . Right hip replacement      08/2006  . Cholecystectomy      01/2008  . Porta cath placement    . Porta cath removal    . Umbilical hernia repair      01/2008  . Excision of left periauricular cyst      10/2009  . Excision of right ear lobe cyst with primary closur      11/2007  . Portacath placement  01/05/2012    Procedure: INSERTION PORT-A-CATH;  Surgeon: Odis Hollingshead, MD;  Location: [facility];  Service:  General;  Laterality: N/A;  ultrasound guiced port a cath insertion with fluoroscopy   Family History  Problem Relation Age of Onset  . Sickle cell trait Mother   . Depression Mother   . Diabetes Mother   . Sickle cell trait Father   . Sickle cell trait Brother    History  Substance Use Topics  . Smoking status: Former Smoker -- 13 years    Quit date: 07/08/2010  . Smokeless tobacco: Never Used  . Alcohol Use: 1.2 oz/week    2 Shots of liquor per week    Review of Systems  Constitutional: Negative for fever.  Respiratory: Negative for cough and shortness of breath.   Cardiovascular: Positive for chest pain. Negative for leg swelling.  All other systems reviewed and are negative.     Allergies  Review of patient's allergies indicates no known allergies.  Home Medications   Prior to Admission medications   Medication Sig Start Date End Date Taking? Authorizing Provider  Cholecalciferol (VITAMIN D) 2000 UNITS tablet Take 1 tablet (2,000 Units total) by mouth daily. 02/13/14  Yes [name], MD  diltiazem (CARDIZEM CD) 120 MG 24 hr capsule Take 1 capsule (120 mg total) by mouth daily. 03/13/14  Yes Costin Karlyne Greenspan, MD  folic acid (FOLVITE) 1  MG tablet Take 1 tablet (1 mg total) by mouth every morning. 10/08/13  Yes [name], MD  HYDROmorphone (DILAUDID) 4 MG tablet Take 1 tablet (4 mg total) by mouth every 4 (four) hours as needed for severe pain. 03/13/14  Yes Costin Karlyne Greenspan, MD  hydroxyurea (HYDREA) 500 MG capsule Take 3 capsules (1,500 mg total) by mouth daily. May take with food to minimize GI side effects. 10/30/13  Yes [name], MD  lisinopril (PRINIVIL,ZESTRIL) 10 MG tablet Take 1 tablet (10 mg total) by mouth daily. 01/22/14  Yes [name], MD  metoprolol tartrate (LOPRESSOR) 25 MG tablet Take 1 tablet (25 mg total) by mouth daily. 08/22/13  Yes [name], MD  morphine (MS CONTIN) 30 MG 12 hr tablet Take 1 tablet (30 mg  total) by mouth every 12 (twelve) hours. 02/26/14  Yes [name], MD  potassium chloride SA (K-DUR,KLOR-CON) 20 MEQ tablet Take 1 tablet (20 mEq total) by mouth every morning. 09/25/13  Yes [name], MD  rivaroxaban (XARELTO) 20 MG TABS tablet Take 1 tablet (20 mg total) by mouth every morning. 03/13/14  Yes Costin Karlyne Greenspan, MD  zolpidem (AMBIEN) 10 MG tablet Take 1 tablet (10 mg total) by mouth at bedtime as needed for sleep. 10/28/13  Yes Dorena Dew, FNP   There were no vitals taken for this visit. Physical Exam  Constitutional: He is oriented to person, place, and time. He appears well-developed and well-nourished. No distress.  HENT:  Head: Normocephalic and atraumatic.  Mouth/Throat: Oropharynx is clear and moist. No oropharyngeal exudate.  Eyes: EOM are normal. Pupils are equal, round, and reactive to light.  Neck: Normal range of motion. Neck supple.  Cardiovascular: Normal rate and regular rhythm.  Exam reveals no friction rub.   No murmur heard. Pulmonary/Chest: Effort normal and breath sounds normal. No respiratory distress. He has no wheezes. He has no rales.  Abdominal: Soft. He exhibits no distension. There is no tenderness. There is no rebound.  Musculoskeletal: Normal range of motion. He exhibits no edema.  Neurological: He is alert and oriented to person, place, and time. No cranial nerve deficit. He exhibits normal muscle tone. Coordination normal.  Skin: No rash noted. He is not diaphoretic.  Nursing note and vitals reviewed.   ED Course  Procedures (including critical care time) Labs Review Labs Reviewed  [place], ED    Imaging Review No results found.   EKG Interpretation   Date/Time:  Friday March 14 2014 12:50:16 EST Ventricular Rate:  88 PR Interval:  160 QRS Duration: 110 QT Interval:  428 QTC Calculation: 518 R Axis:   -81 Text Interpretation:  Sinus rhythm Probable left atrial  enlargement RSR'  in V1 or V2, probably normal variant Inferior infarct, old Abnrm T,  probable ischemia, anterolateral lds Similar to prior Confirmed by Mingo Amber   MD, [place] ([id number]) on 03/14/2014 12:56:00 PM      MDM   Final diagnoses:  Hypoxia    3M with hx of sickle cell disease presents with body aches. Just discharged from the hospital, found to have Afib with RVR, restarted on xarelto, has hx of PEs has been noncompliant with blood thinners. Stated dizziness this morning, didn't take his medications. Having body aches, states it feels like his prior sickle cell crisis pain. Afebrile, hypoxic in the mid 80s on room air, corrected on 2 L .  Lungs clear. No neurologic symptoms.  Will check labs, CXR.  CXR ok. Troponin elevated, hx of same, has been noncompliant with f/u for stress test. No EKG changes.   Dr. Alben Deeds with [facility] will admit.   Evelina Bucy, MD 03/17/14 [phone]

## 2014-03-14 NOTE — ED Notes (Signed)
Transfer to floor delayed.  Pt went to [facility]

## 2014-03-14 NOTE — ED Notes (Signed)
Delay in lab draw due to patient being in [place].

## 2014-03-14 NOTE — ED Notes (Signed)
Bed: WA03 Expected date:  Expected time:  Means of arrival:  Comments: EMS-dizzy

## 2014-03-15 DIAGNOSIS — R0602 Shortness of breath: Secondary | ICD-10-CM | POA: Diagnosis not present

## 2014-03-15 DIAGNOSIS — I5022 Chronic systolic (congestive) heart failure: Secondary | ICD-10-CM | POA: Diagnosis not present

## 2014-03-15 DIAGNOSIS — R4702 Dysphasia: Secondary | ICD-10-CM | POA: Diagnosis not present

## 2014-03-15 DIAGNOSIS — E559 Vitamin D deficiency, unspecified: Secondary | ICD-10-CM | POA: Diagnosis not present

## 2014-03-15 DIAGNOSIS — R4781 Slurred speech: Secondary | ICD-10-CM | POA: Diagnosis not present

## 2014-03-15 DIAGNOSIS — R42 Dizziness and giddiness: Secondary | ICD-10-CM | POA: Diagnosis not present

## 2014-03-15 DIAGNOSIS — R0600 Dyspnea, unspecified: Secondary | ICD-10-CM | POA: Diagnosis not present

## 2014-03-15 DIAGNOSIS — I2699 Other pulmonary embolism without acute cor pulmonale: Secondary | ICD-10-CM | POA: Diagnosis not present

## 2014-03-15 LAB — CBC WITH DIFFERENTIAL/PLATELET
Basophils Absolute: 0.1 10*3/uL (ref 0.0–0.1)
Basophils Relative: 1 % (ref 0–1)
Eosinophils Absolute: 0.5 10*3/uL (ref 0.0–0.7)
Eosinophils Relative: 4 % (ref 0–5)
HCT: 19.5 % — ABNORMAL LOW (ref 39.0–52.0)
HEMOGLOBIN: 6.8 g/dL — AB (ref 13.0–17.0)
Lymphocytes Relative: 17 % (ref 12–46)
Lymphs Abs: 2.2 10*3/uL (ref 0.7–4.0)
MCH: 32.7 pg (ref 26.0–34.0)
MCHC: 34.9 g/dL (ref 30.0–36.0)
MCV: 93.8 fL (ref 78.0–100.0)
MONOS PCT: 11 % (ref 3–12)
Monocytes Absolute: 1.4 10*3/uL — ABNORMAL HIGH (ref 0.1–1.0)
Neutro Abs: 8.5 10*3/uL — ABNORMAL HIGH (ref 1.7–7.7)
Neutrophils Relative %: 67 % (ref 43–77)
Platelets: 322 10*3/uL (ref 150–400)
RBC: 2.08 MIL/uL — AB (ref 4.22–5.81)
RDW: 23 % — AB (ref 11.5–15.5)
WBC: 12.7 10*3/uL — ABNORMAL HIGH (ref 4.0–10.5)

## 2014-03-15 LAB — COMPREHENSIVE METABOLIC PANEL
ALK PHOS: 64 U/L (ref 39–117)
ALT: 17 U/L (ref 0–53)
ANION GAP: 5 (ref 5–15)
AST: 33 U/L (ref 0–37)
Albumin: 3 g/dL — ABNORMAL LOW (ref 3.5–5.2)
BUN: <5 mg/dL — ABNORMAL LOW (ref 6–23)
CALCIUM: 7.5 mg/dL — AB (ref 8.4–10.5)
CO2: 22 mmol/L (ref 19–32)
Chloride: 109 mmol/L (ref 96–112)
Creatinine, Ser: 0.53 mg/dL (ref 0.50–1.35)
GFR calc non Af Amer: >90 mL/min (ref >90)
Glucose, Bld: 98 mg/dL (ref 70–99)
POTASSIUM: 3.1 mmol/L — AB (ref 3.5–5.1)
Sodium: 136 mmol/L (ref 135–145)
Total Bilirubin: 4.9 mg/dL — ABNORMAL HIGH (ref 0.3–1.2)
Total Protein: 6 g/dL (ref 6.0–8.3)

## 2014-03-15 LAB — PREPARE RBC (CROSSMATCH)

## 2014-03-15 LAB — HEPARIN LEVEL (UNFRACTIONATED)

## 2014-03-15 LAB — APTT: APTT: 41 s — AB (ref 24–37)

## 2014-03-15 LAB — RETICULOCYTES
RBC.: 2.08 MIL/uL — ABNORMAL LOW (ref 4.22–5.81)
RETIC CT PCT: 12.9 % — AB (ref 0.4–3.1)
Retic Count, Absolute: 268.3 10*3/uL — ABNORMAL HIGH (ref 19.0–186.0)

## 2014-03-15 LAB — MAGNESIUM: MAGNESIUM: 1.4 mg/dL — AB (ref 1.5–2.5)

## 2014-03-15 LAB — LACTATE DEHYDROGENASE: LDH: 440 U/L — ABNORMAL HIGH (ref 94–250)

## 2014-03-15 LAB — HEMOGLOBIN AND HEMATOCRIT, BLOOD
HCT: 24 % — ABNORMAL LOW (ref 39.0–52.0)
HEMOGLOBIN: 8.3 g/dL — AB (ref 13.0–17.0)

## 2014-03-15 LAB — MRSA PCR SCREENING: MRSA BY PCR: POSITIVE — AB

## 2014-03-15 MED ORDER — RIVAROXABAN 15 MG PO TABS
15.0000 mg | ORAL_TABLET | Freq: Two times a day (BID) | ORAL | Status: DC
  Administered 2014-03-16 – 2014-03-24 (×17): 15 mg via ORAL
  Filled 2014-03-15 (×19): qty 1

## 2014-03-15 MED ORDER — POTASSIUM CHLORIDE 10 MEQ/100ML IV SOLN
10.0000 meq | INTRAVENOUS | Status: AC
  Administered 2014-03-15 (×2): 10 meq via INTRAVENOUS
  Filled 2014-03-15 (×2): qty 100

## 2014-03-15 MED ORDER — RIVAROXABAN 15 MG PO TABS
15.0000 mg | ORAL_TABLET | ORAL | Status: AC
  Administered 2014-03-15 (×2): 15 mg via ORAL
  Filled 2014-03-15 (×2): qty 1

## 2014-03-15 MED ORDER — HEPARIN (PORCINE) IN NACL 100-0.45 UNIT/ML-% IJ SOLN
1400.0000 [IU]/h | INTRAMUSCULAR | Status: DC
  Filled 2014-03-15: qty 250

## 2014-03-15 MED ORDER — HEPARIN BOLUS VIA INFUSION
2000.0000 [IU] | Freq: Once | INTRAVENOUS | Status: AC
  Administered 2014-03-15: 2000 [IU] via INTRAVENOUS
  Filled 2014-03-15: qty 2000

## 2014-03-15 MED ORDER — SODIUM CHLORIDE 0.9 % IV SOLN: Freq: Once | INTRAVENOUS | Status: DC

## 2014-03-15 NOTE — Progress Notes (Signed)
Patient ID: [name], male   DOB: [birth year], 34 y.o.   MRN: [id number]  SICKLE CELL SERVICE PROGRESS NOTE  DERRYL UHER TOI:[id number] DOB: [birth year] DOA: 03/14/2014 PCP: [name],[name], MD   Presenting HPI: [name] is a 34yo male with sickle cell disease who presented with complaints of SOB. He was recently hospitalized from 2/9-2/11 for acute on chronic PE seen on CT angio from 2/9. He was sent home yesterday, and reports doing well initially but around 3am this morning he woke up feeling SOB. He then went back to sleep but arose this morning still feeling SOB, dizzy (lightheaded), and having worsening chest and back pain. He then came the ED. Pt states he has taken all of his meds including Xarelto. He denies any fever, cough, nausea, vomiting, heart palpitation, weakness, or LOC.  In the ED, he was hypoxic down to 85% o2 sat. He was placed on 5LPM via nasal cannula. A CXR was performed showing no acute opacities. Troponin was 2.59 and Hgb of 7.5. He was given a few doses of IV Dilaudid for his pain.  Consultants:  None  Procedures:  TTE AND V/Q SCAN  Antibiotics:  none  HPI/Subjective: Pt states that pain is better. He only feels pain along his right side. He also states his SOB is better especially with the oxygen. He ate breakfast this morning without any problems. He was started on a heparin drip last night. TTE obtained late morning and 2 units of PRBC given thus far.  Objective: Filed Vitals:   03/15/14 1200 03/15/14 1300 03/15/14 1600 03/15/14 1639  BP: 115/77 126/78  131/86  Pulse:      Temp: 98.2 F (36.8 C)     TempSrc: Oral     Resp: 20 16 19 12   Height:      Weight:      SpO2: 94%  93%    Weight change:   Intake/Output Summary (Last 24 hours) at 03/15/14 1706 Last data filed at 03/15/14 1422  Gross per 24 hour  Intake 1641.15 ml  Output    725 ml  Net 916.15 ml    General: Alert, awake, oriented x3, in no acute distress.   HEENT: [place]/AT PEERL, EOMI, nasal cannula in place OROPHARYNX:  Moist, No exudate/ erythema/lesions.  Heart: Regular rate and rhythm, without murmurs, rubs, gallops Lungs: Clear to auscultation, no wheezing or rhonchi noted. Abdomen: Soft, nontender, nondistended, positive bowel sounds, no masses no hepatosplenomegaly noted. Neuro: No focal neurological deficits noted  Extremities: no edema or cyanosis   Data Reviewed: Basic Metabolic Panel:  Recent Labs Lab 03/11/14 0500 03/12/14 0410 03/13/14 0649 03/14/14 1332 03/15/14 0830  NA 137 139 137 135 136  K 3.1* 3.7 4.1 3.7 3.1*  CL 108 110 109 108 109  CO2 22 22 22 22 22   GLUCOSE 121* 135* 102* 119* 98  BUN 5* 7 6 6  <5*  CREATININE 0.51 0.54 0.41* 0.47* 0.53  CALCIUM 8.9 8.4 8.3* 8.5 7.5*  MG  --   --   --   --  1.4*   Liver Function Tests:  Recent Labs Lab 03/09/14 0343 03/11/14 0500 03/13/14 0649 03/14/14 1955 03/15/14 0830  AST 42* 44* 35 46* 33  ALT 32 26 20 20 17   ALKPHOS 97 93 76 81 64  BILITOT 7.8* 9.4* 5.6* 5.1* 4.9*  PROT 7.5 7.3 7.0 7.4 6.0  ALBUMIN 4.0 3.8 3.9 3.8 3.0*   CBC:  Recent Labs Lab 03/09/14 0343 03/11/14 0500  03/12/14 0410 03/13/14 0441 03/14/14 1332 03/15/14 0830 03/15/14 1344  WBC 15.5* 23.9* 21.8* 17.0* 14.6* 12.7*  --   NEUTROABS 10.9* 19.9*  --   --   --  8.5*  --   HGB 6.9* 8.1* 7.3* 7.6* 7.5* 6.8* 8.3*  HCT 18.8* 22.7* 20.9* 22.0* 21.3* 19.5* 24.0*  MCV 94.5 93.0 96.8 97.3 95.9 93.8  --   PLT 386 411* 390 395 381 322  --     Recent Labs  01/01/14 0909  PROBNP 5790.0*   Studies: Dg Chest 2 View  03/14/2014   CLINICAL DATA:  Dizzy and weak, history of sickle cell  EXAM: CHEST  2 VIEW  COMPARISON:  03/11/2014  FINDINGS: Cardiac shadow remains enlarged. A left chest wall port is again seen. The lungs are clear without focal infiltrate. The bony structures are within normal limits.  IMPRESSION: No acute abnormality is noted. The patient does have a recent diagnosis of acute  on chronic pulmonary embolism on 03/11/2014   Electronically Signed   By: Inez Catalina M.D.   On: 03/14/2014 14:58   Dg Chest 2 View  03/11/2014   CLINICAL DATA:  Acute onset of shortness of breath. Decreased O2 saturation. Generalized weakness and pain. Initial encounter.  EXAM: CHEST  2 VIEW  COMPARISON:  Chest radiograph performed 03/06/2014  FINDINGS: The lungs are well-aerated. Vascular congestion is noted. Minimal bilateral atelectasis is seen. There is no evidence of pleural effusion or pneumothorax.  The heart is mildly enlarged. A left-sided chest port is noted ending about the cavoatrial junction. No acute osseous abnormalities are seen.  IMPRESSION: Vascular congestion and mild cardiomegaly. Minimal bilateral atelectasis seen.   Electronically Signed   By: Garald Balding M.D.   On: 03/11/2014 06:24   Dg Chest 2 View  03/06/2014   CLINICAL DATA:  Sickle cell crisis, shortness of breath, upper chest pain for 48 hr.  EXAM: CHEST  2 VIEW  COMPARISON:  Chest radiograph February 23, 2014  FINDINGS: The cardiac silhouette is mild-to-moderately enlarged. Pulmonary vascular congestion, increased without pleural effusion or focal consolidation. No pneumothorax.  LEFT chest Port-A-Cath with distal tip projecting cavoatrial junction. No pneumothorax. Soft tissue planes and included osseous structures are nonacute.  IMPRESSION: Mild cardiomegaly and pulmonary vascular congestion without focal consolidation.   Electronically Signed   By: Elon Alas   On: 03/06/2014 03:12   Dg Chest 2 View  02/23/2014   CLINICAL DATA:  Sickle cell crisis, cough, congestion  EXAM: CHEST  2 VIEW  COMPARISON:  02/03/2014  FINDINGS: Borderline cardiomegaly again noted. Stable central mild vascular congestion. No definite superimposed infiltrate or pulmonary edema. Stable left Port-A-Cath position. Bony thorax is stable.  IMPRESSION: Borderline cardiomegaly. Central vascular congestion without convincing pulmonary edema. No  segmental infiltrate.   Electronically Signed   By: Lahoma Crocker M.D.   On: 02/23/2014 09:36   Ct Angio Chest Pe W/cm &/or Wo Cm  03/11/2014   CLINICAL DATA:  Sickle cell disease with sudden onset left-sided chest pain.  EXAM: CT ANGIOGRAPHY CHEST WITH CONTRAST  TECHNIQUE: Multidetector CT imaging of the chest was performed using the standard protocol during bolus administration of intravenous contrast. Multiplanar CT image reconstructions and MIPs were obtained to evaluate the vascular anatomy.  CONTRAST:  186mL OMNIPAQUE IOHEXOL 350 MG/ML SOLN  COMPARISON:  01/01/2014  FINDINGS: THORACIC INLET/BODY WALL:  Left subclavian porta catheter, tip in the right atrium.  MEDIASTINUM:  There is multi focal pulmonary embolism with clot primarily subsegmental  but intermittently occlusive. Clot in the posterior segment left upper lobe on image 92, and in the left lower lobe on image 175 and 183 is chronic and without progression from 01/01/2014. The lower lobe clot was previously obscured by artifact, but with the benefit of the current scan was present. There is acute pulmonary embolism to the right upper and right lower lobes, visualized on images 117, 164 and 177.  There is chronic cardiomegaly and right heart enlargement, which limits utility of the RV to LV ratio. The main pulmonary artery is chronically dilated at 37 mm. There is no progression of these findings to suggest acute right heart failure. No pericardial effusion. No aortic dissection. There is chronic mild enlargement of upper mediastinal lymph nodes.  LUNG WINDOWS:  Patchy ground-glass density in the bilateral lungs, with lower lung subsegmental atelectasis or scarring that is stable from prior and likely fixed, chronic lung disease. No lung infarct noted.  UPPER ABDOMEN:  Splenic auto infarction.  OSSEOUS:  Typical osseous changes of sickle cell disease.  Critical Value/emergent results were called by telephone at the time of interpretation on 03/11/2014 at  2:00 pm to Dr. Charlynne Cousins , who verbally acknowledged these results.  Review of the MIP images confirms the above findings.  IMPRESSION: Acute on chronic pulmonary embolism with diffuse subsegmental clot. Chronic pulmonary artery hypertension; no morphologic change to suggest acute right heart failure.   Electronically Signed   By: Monte Fantasia M.D.   On: 03/11/2014 14:08   Nm Pulmonary Perf And Vent  03/14/2014   CLINICAL DATA:  Hypoxia.  Sickle cell disease  EXAM: NUCLEAR MEDICINE VENTILATION - PERFUSION LUNG SCAN  Views: Anterior, posterior, left lateral, right lateral, RPO, LPO, RAO, LAO -ventilation and perfusion  Radionuclide: Technetium 9m DTPA -ventilation; Technetium 40m macroaggregated albumin-perfusion  Dose:  35.3 mCi-ventilation; 5.3 mCi-perfusion  Route of administration: Inhalation-ventilation; intravenous-perfusion  COMPARISON:  Chest radiograph March 14, 2014; prior ventilation and perfusion lung scan Jun 19, 2013 ; chest CT angiogram March 11, 2014  FINDINGS: Ventilation: Radiotracer uptake is homogeneous and symmetric bilaterally. Cardiomegaly is noted. No focal ventilation defects are identified. The appearance is stable compared to prior lung scan.  Perfusion: This patient has known peripheral chronic pulmonary emboli based on CT appearance. On the perfusion study, there are peripheral small wedge-shaped defects, stable from prior lung scan consistent with chronic pulmonary emboli in the periphery as noted on recent CT as well. There is no segmental perfusion defect. The appearance is stable compared to prior study.  IMPRESSION: Evidence of small peripheral chronic emboli bilaterally based on prior lung scan and recent chest CT angiogram appearance. No new pulmonary embolus is appreciable on this study. No segmental perfusion defects seen currently. No ventilation defects identified. Cardiomegaly is noted.   Electronically Signed   By: Lowella Grip III M.D.   On:  03/14/2014 18:41    Scheduled Meds: . sodium chloride   Intravenous Once  . cholecalciferol  2,000 Units Oral Daily  . diltiazem  120 mg Oral Daily  . folic acid  1 mg Oral q morning - 10a  . HYDROmorphone PCA 2 mg/mL   Intravenous 6 times per day  . hydroxyurea  1,500 mg Oral Q breakfast  . ketorolac  30 mg Intravenous 4 times per day  . lisinopril  10 mg Oral Daily  . metoprolol tartrate  25 mg Oral Daily  . morphine  30 mg Oral Q12H  . potassium chloride SA  20 mEq  Oral q morning - 10a  . [START ON 03/16/2014] rivaroxaban  15 mg Oral BID WC  . rivaroxaban  15 mg Oral 2 times per day on Sat   Continuous Infusions: . dextrose 5 % and 0.45% NaCl 125 mL/hr at 03/15/14 1633    Principal Problem:   Pulmonary embolus Active Problems:   Vitamin D deficiency   Hypokalemia   Embolism, pulmonary with infarction   Hb-SS disease with crisis   Demand ischemia of myocardium   Sickle cell pain crisis   Hypoxia   Assessment/Plan: Principal Problem:   Pulmonary embolus Active Problems:   Vitamin D deficiency   Hypokalemia   Embolism, pulmonary with infarction   Hb-SS disease with crisis   Demand ischemia of myocardium   Sickle cell pain crisis   Hypoxia  1.   Respiratory: V/Q scan showing chronic PE, previous CT showing acute on chronic PE.         -Pt is stable and has improved. Will stop heparin drip.  -Will restart Xarelto at therapeutic PE dose of 15mg  twice daily -Pt also has pulmonary HTN that has likely worsened. He previously did not required oxygen for it, but has not been seen in [facility] clinic. His RV pressures have increased from 62 on 01/02/14 to 76 on today's TTE. Will consult pulmonology in the morning to see if we can optimize his care. Cardiology-Dr. Acie Fredrickson did not feel they could be helpful due to the underlying etiology. -Supplemental O2 as needed to keep O2 sats above 95%. -Pt declined ABG 2. Cardiology: Troponin of 2.59 likely due to demand ischemia. EKG  with no ST elevations or arrythmias. Pt previously had Afib with RVR that resolved during his last admission. -No events on tele. Pt will be transferred to med-surg. -Continue diltiazem -HTN is controlled. Continue home Lisinopril and Metoprolol -Obtain repeat Troponin in the morning to trend. 3. Heme: -Sickle Cell Anemia with crisis: Pain has improved. Will continue Dilaudid PCA and Toradol. Continue MS contin for long acting.  -Anemia: Hgb of  8.3 after 2 units PRBC. Will transfuse him 1 unit tonight, goal of 10.  -Continue his routine sickle cell care with folic acid and hydroxyurea.  4. ID: No fevers, Leukocytosis: Trending down, now at 12.7. Likely secondary to crisis. Will monitor.    5. FEN/GI :D5/0.45% NS-continue at a lower rate of 62ml/hr now that he is eating. Continue KDur for h/o hypokalemia and vitD for his vitD deficiency. regular diet.  Bowel regimen in place  Time Spent:35 minutes. Code Status: Full code DVT Prophylaxis: on Xarelto Family Communication: none Disposition Plan: pending improvement  Kalman Shan  Pager [phone]. If 7PM-7AM, please contact night-coverage.  03/15/2014, 5:06 PM  LOS: 1 day   Kalman Shan

## 2014-03-15 NOTE — Progress Notes (Signed)
  Echocardiogram 2D Echocardiogram has been performed.  Lysle Rubens 03/15/2014, 11:31 AM

## 2014-03-15 NOTE — Progress Notes (Signed)
[facility] for heparin Indication: pulmonary embolus  No Known Allergies  Patient Measurements: Weight= 73kg on 03/11/14 IBW= 77.6kg Heparin Dosing Weight: 68kg  Vital Signs: Temp: 98.6 F (37 C) (02/13 0700) Temp Source: Oral (02/13 0700) BP: 109/73 mmHg (02/13 0800)  Labs:  Recent Labs  03/12/14 1230  03/13/14 0441 03/13/14 0649 03/14/14 1332 03/14/14 1955 03/15/14 0830  HGB  --   < > 7.6*  --  7.5*  --  6.8*  HCT  --   --  22.0*  --  21.3*  --  19.5*  PLT  --   --  395  --  381  --  322  APTT  --   --   --   --   --  35 41*  LABPROT  --   --   --   --   --  15.7*  --   INR  --   --   --   --   --  1.24  --   HEPARINUNFRC  --   --   --   --   --  <0.10* <0.10*  CREATININE  --   --   --  0.41* 0.47*  --  0.53  TROPONINI 1.63*  --   --   --   --   --   --   < > = values in this interval not displayed.  Estimated Creatinine Clearance: 125.9 mL/min (by C-G formula based on Cr of 0.53).  Medical History: Past Medical History  Diagnosis Date  . Sickle cell anemia   . Blood transfusion   . Acute embolism and thrombosis of right internal jugular vein   . Hypokalemia   . Mood disorder   . History of pulmonary embolus (PE)   . Avascular necrosis   . Leukocytosis     Chronic  . Thrombocytosis     Chronic  . Hypertension   . History of Clostridium difficile infection   . Uses marijuana   . Chronic anticoagulation   . Functional asplenia   . Former smoker   . Second hand tobacco smoke exposure   . Alcohol consumption of one to four drinks per day   . Noncompliance with medication regimen   . Sickle-cell crisis with associated acute chest syndrome 05/13/2013  . Acute chest syndrome 06/18/2013  . Demand ischemia 01/02/2014   Assessment: 87 yoM with Hx sickle cell disease with frequent crisis and admissions, on Xarelto PTA for chronic pulmonary embolism is admitted 2/12 with dizziness. Patient admitted 2/4-2/7 for sickle cell pain,  and 2/9-2/11 for sickle cell pain with CP and elevated troponins. Pt found to have acute on chronic pulmonary embolism on CT angio during this past admission. Patient was discharged back on Xarelto. VQ scan 2/12 shows no new PE on. Pharmacy is consulted to dose heparin gtt for pulmonary embolism.   Patient reported some noncompliance to Xarelto during previous admission  Patient reports last dose of Xarelto taken 2/12 am  Hgb 7.5 on admisson, close to patient's baseline  Platelets WNL  troponins elevated to 2.59  Baseline anti-Xa level undetectable  Baseline aPTT is WNL at 35sec  Baseline INR WNL at 1.24  No bleeding has been noted  Under normal circumstances would wait to start heparin gtt 24 hours post last Xarelto dose  Due to baseline anti-Xa, aPTT, and INR all being WNL will go ahead and start heparin gtt tonight without bolus  Noted sickle cell MD  with tentative plans to start treatment dose Xarelto on 2/13  depending on patient status.   Today 2/13:  First Heparin level delayed for transfusion, scheduled for 0600, drawn ~ 0830, level < 0.1 units/ml. Heparin not infusing, time off Heparin unknown. APTT 41 sec, normal value  Will bolus and increase infusion, await MD for decision on resuming Xarelto  Goal of Therapy:  Heparin level 0.3-0.7 units/ml aPTT 66-102 seconds Monitor platelets by anticoagulation protocol: Yes   Plan:  - Heparin bolus 2000 units, increase infusion to 1400 units/hr - 6 hr Heparin level - daily heparin level and CBC starting 2/14 - follow-up plans for Xarelto restart possibly today  Thank you for the consult.  Minda Ditto PharmD Pager [phone] 03/15/2014, 9:38 AM

## 2014-03-16 ENCOUNTER — Inpatient Hospital Stay (HOSPITAL_COMMUNITY): Payer: Medicare Other

## 2014-03-16 DIAGNOSIS — R4781 Slurred speech: Secondary | ICD-10-CM | POA: Diagnosis not present

## 2014-03-16 DIAGNOSIS — E559 Vitamin D deficiency, unspecified: Secondary | ICD-10-CM | POA: Diagnosis not present

## 2014-03-16 DIAGNOSIS — R4702 Dysphasia: Secondary | ICD-10-CM | POA: Diagnosis not present

## 2014-03-16 DIAGNOSIS — I5022 Chronic systolic (congestive) heart failure: Secondary | ICD-10-CM | POA: Diagnosis not present

## 2014-03-16 DIAGNOSIS — I2699 Other pulmonary embolism without acute cor pulmonale: Secondary | ICD-10-CM | POA: Diagnosis not present

## 2014-03-16 DIAGNOSIS — R42 Dizziness and giddiness: Secondary | ICD-10-CM | POA: Diagnosis not present

## 2014-03-16 DIAGNOSIS — R0602 Shortness of breath: Secondary | ICD-10-CM | POA: Diagnosis not present

## 2014-03-16 LAB — CBC WITH DIFFERENTIAL/PLATELET
BASOS ABS: 0 10*3/uL (ref 0.0–0.1)
Basophils Relative: 0 % (ref 0–1)
Eosinophils Absolute: 0.5 10*3/uL (ref 0.0–0.7)
Eosinophils Relative: 3 % (ref 0–5)
HCT: 23.5 % — ABNORMAL LOW (ref 39.0–52.0)
Hemoglobin: 8.4 g/dL — ABNORMAL LOW (ref 13.0–17.0)
LYMPHS ABS: 1.6 10*3/uL (ref 0.7–4.0)
Lymphocytes Relative: 10 % — ABNORMAL LOW (ref 12–46)
MCH: 32.3 pg (ref 26.0–34.0)
MCHC: 35.7 g/dL (ref 30.0–36.0)
MCV: 90.4 fL (ref 78.0–100.0)
MONOS PCT: 9 % (ref 3–12)
Monocytes Absolute: 1.5 10*3/uL — ABNORMAL HIGH (ref 0.1–1.0)
NEUTROS ABS: 12.7 10*3/uL — AB (ref 1.7–7.7)
Neutrophils Relative %: 78 % — ABNORMAL HIGH (ref 43–77)
Platelets: 328 10*3/uL (ref 150–400)
RBC: 2.6 MIL/uL — ABNORMAL LOW (ref 4.22–5.81)
RDW: 23.6 % — ABNORMAL HIGH (ref 11.5–15.5)
WBC: 16.3 10*3/uL — ABNORMAL HIGH (ref 4.0–10.5)
nRBC: 2 /100 WBC — ABNORMAL HIGH

## 2014-03-16 LAB — LACTATE DEHYDROGENASE: LDH: 503 U/L — AB (ref 94–250)

## 2014-03-16 LAB — BASIC METABOLIC PANEL
ANION GAP: 6 (ref 5–15)
BUN: 8 mg/dL (ref 6–23)
CALCIUM: 8.3 mg/dL — AB (ref 8.4–10.5)
CO2: 22 mmol/L (ref 19–32)
CREATININE: 0.5 mg/dL (ref 0.50–1.35)
Chloride: 109 mmol/L (ref 96–112)
Glucose, Bld: 116 mg/dL — ABNORMAL HIGH (ref 70–99)
POTASSIUM: 3.9 mmol/L (ref 3.5–5.1)
Sodium: 137 mmol/L (ref 135–145)

## 2014-03-16 LAB — TROPONIN I: Troponin I: 2.19 ng/mL (ref <0.031)

## 2014-03-16 LAB — RETICULOCYTES
RBC.: 2.6 MIL/uL — AB (ref 4.22–5.81)
RETIC CT PCT: 7.8 % — AB (ref 0.4–3.1)
Retic Count, Absolute: 202.8 10*3/uL — ABNORMAL HIGH (ref 19.0–186.0)

## 2014-03-16 LAB — PREPARE RBC (CROSSMATCH)

## 2014-03-16 MED ORDER — SODIUM CHLORIDE 0.9 % IV SOLN: Freq: Once | INTRAVENOUS | Status: DC

## 2014-03-16 NOTE — Progress Notes (Signed)
CRITICAL VALUE ALERT  Critical value received:  Troponin 2.19  Date of notification:  03/16/14  Time of notification:  0740  Critical value read back:Yes.    Nurse who received alert:  Leola Brazil  MD notified (1st page):  Dr. Alben Deeds  Time of first page:  0750  MD notified (2nd page):  Time of second page:  Responding MD:  Dr. Alben Deeds  Time MD responded:  [phone]

## 2014-03-16 NOTE — Progress Notes (Signed)
Patient ID: [name], male   DOB: Jun 22, 1980, 34 y.o.   MRN: [id number]  SICKLE CELL SERVICE PROGRESS NOTE  SHERVIN CYPERT DQQ:[id number] DOB: 25-Mar-1980 DOA: 03/14/2014 PCP: [name],[name], MD   Presenting HPI: [name] is a 34yo male with sickle cell disease who presented with complaints of SOB. He was recently hospitalized from 2/9-2/11 for acute on chronic PE seen on CT angio from 2/9. He was sent home yesterday, and reports doing well initially but around 3am this morning he woke up feeling SOB. He then went back to sleep but arose this morning still feeling SOB, dizzy (lightheaded), and having worsening chest and back pain. He then came the ED. Pt states he has taken all of his meds including Xarelto. He denies any fever, cough, nausea, vomiting, heart palpitation, weakness, or LOC.  In the ED, he was hypoxic down to 85% o2 sat. He was placed on 5LPM via nasal cannula. A CXR was performed showing no acute opacities. Troponin was 2.59 and Hgb of 7.5. He was given a few doses of IV Dilaudid for his pain.  Consultants:  None  Procedures:  TTE AND V/Q SCAN this admission  Antibiotics:  none  HPI/Subjective: Pt states that his right sided chest pain is better. He rates it asa 6/10. He feels that his breathing is about the same. He is maintaining an spO2 >94% on 3L [place]. He received his unit of blood last night. Objective: Filed Vitals:   03/16/14 0900 03/16/14 1000 03/16/14 1100 03/16/14 1200  BP: 123/86 129/79 127/78 128/89  Pulse:      Temp:      TempSrc:      Resp: 12 18 19 19   Height:      Weight:      SpO2:       Weight change: 0 lb (0 kg)  Intake/Output Summary (Last 24 hours) at 03/16/14 1248 Last data filed at 03/16/14 1200  Gross per 24 hour  Intake 2657.5 ml  Output   2975 ml  Net -317.5 ml    General: Alert, awake, oriented x3, in no acute distress.  HEENT: [place]/AT PEERL, EOMI, nasal cannula in place OROPHARYNX:  Moist, No exudate/  erythema/lesions.  Heart: Regular rate and rhythm, without murmurs, rubs, gallops Lungs: Clear to auscultation, no wheezing or rhonchi noted. Abdomen: Soft, nontender, nondistended, positive bowel sounds, no masses no hepatosplenomegaly noted. Neuro: No focal neurological deficits noted  Extremities: no edema or cyanosis -unchanged  Data Reviewed: Basic Metabolic Panel:  Recent Labs Lab 03/12/14 0410 03/13/14 0649 03/14/14 1332 03/15/14 0830 03/16/14 0640  NA 139 137 135 136 137  K 3.7 4.1 3.7 3.1* 3.9  CL 110 109 108 109 109  CO2 22 22 22 22 22   GLUCOSE 135* 102* 119* 98 116*  BUN 7 6 6  <5* 8  CREATININE 0.54 0.41* 0.47* 0.53 0.50  CALCIUM 8.4 8.3* 8.5 7.5* 8.3*  MG  --   --   --  1.4*  --    Liver Function Tests:  Recent Labs Lab 03/11/14 0500 03/13/14 0649 03/14/14 1955 03/15/14 0830  AST 44* 35 46* 33  ALT 26 20 20 17   ALKPHOS 93 76 81 64  BILITOT 9.4* 5.6* 5.1* 4.9*  PROT 7.3 7.0 7.4 6.0  ALBUMIN 3.8 3.9 3.8 3.0*   CBC:  Recent Labs Lab 03/11/14 0500 03/12/14 0410 03/13/14 0441 03/14/14 1332 03/15/14 0830 03/15/14 1344 03/16/14 0640  WBC 23.9* 21.8* 17.0* 14.6* 12.7*  --  16.3*  NEUTROABS 19.9*  --   --   --  8.5*  --  12.7*  HGB 8.1* 7.3* 7.6* 7.5* 6.8* 8.3* 8.4*  HCT 22.7* 20.9* 22.0* 21.3* 19.5* 24.0* 23.5*  MCV 93.0 96.8 97.3 95.9 93.8  --  90.4  PLT 411* 390 395 381 322  --  328    Recent Labs  01/01/14 0909  PROBNP 5790.0*   Studies: Dg Chest 2 View  03/14/2014   CLINICAL DATA:  Dizzy and weak, history of sickle cell  EXAM: CHEST  2 VIEW  COMPARISON:  03/11/2014  FINDINGS: Cardiac shadow remains enlarged. A left chest wall port is again seen. The lungs are clear without focal infiltrate. The bony structures are within normal limits.  IMPRESSION: No acute abnormality is noted. The patient does have a recent diagnosis of acute on chronic pulmonary embolism on 03/11/2014   Electronically Signed   By: Inez Catalina M.D.   On: 03/14/2014 14:58    Dg Chest 2 View  03/11/2014   CLINICAL DATA:  Acute onset of shortness of breath. Decreased O2 saturation. Generalized weakness and pain. Initial encounter.  EXAM: CHEST  2 VIEW  COMPARISON:  Chest radiograph performed 03/06/2014  FINDINGS: The lungs are well-aerated. Vascular congestion is noted. Minimal bilateral atelectasis is seen. There is no evidence of pleural effusion or pneumothorax.  The heart is mildly enlarged. A left-sided chest port is noted ending about the cavoatrial junction. No acute osseous abnormalities are seen.  IMPRESSION: Vascular congestion and mild cardiomegaly. Minimal bilateral atelectasis seen.   Electronically Signed   By: Garald Balding M.D.   On: 03/11/2014 06:24   Dg Chest 2 View  03/06/2014   CLINICAL DATA:  Sickle cell crisis, shortness of breath, upper chest pain for 48 hr.  EXAM: CHEST  2 VIEW  COMPARISON:  Chest radiograph February 23, 2014  FINDINGS: The cardiac silhouette is mild-to-moderately enlarged. Pulmonary vascular congestion, increased without pleural effusion or focal consolidation. No pneumothorax.  LEFT chest Port-A-Cath with distal tip projecting cavoatrial junction. No pneumothorax. Soft tissue planes and included osseous structures are nonacute.  IMPRESSION: Mild cardiomegaly and pulmonary vascular congestion without focal consolidation.   Electronically Signed   By: Elon Alas   On: 03/06/2014 03:12   Dg Chest 2 View  02/23/2014   CLINICAL DATA:  Sickle cell crisis, cough, congestion  EXAM: CHEST  2 VIEW  COMPARISON:  02/03/2014  FINDINGS: Borderline cardiomegaly again noted. Stable central mild vascular congestion. No definite superimposed infiltrate or pulmonary edema. Stable left Port-A-Cath position. Bony thorax is stable.  IMPRESSION: Borderline cardiomegaly. Central vascular congestion without convincing pulmonary edema. No segmental infiltrate.   Electronically Signed   By: Lahoma Crocker M.D.   On: 02/23/2014 09:36   Ct Angio Chest Pe W/cm  &/or Wo Cm  03/11/2014   CLINICAL DATA:  Sickle cell disease with sudden onset left-sided chest pain.  EXAM: CT ANGIOGRAPHY CHEST WITH CONTRAST  TECHNIQUE: Multidetector CT imaging of the chest was performed using the standard protocol during bolus administration of intravenous contrast. Multiplanar CT image reconstructions and MIPs were obtained to evaluate the vascular anatomy.  CONTRAST:  174mL OMNIPAQUE IOHEXOL 350 MG/ML SOLN  COMPARISON:  01/01/2014  FINDINGS: THORACIC INLET/BODY WALL:  Left subclavian porta catheter, tip in the right atrium.  MEDIASTINUM:  There is multi focal pulmonary embolism with clot primarily subsegmental but intermittently occlusive. Clot in the posterior segment left upper lobe on image 92, and in the left lower lobe on image  175 and 183 is chronic and without progression from 01/01/2014. The lower lobe clot was previously obscured by artifact, but with the benefit of the current scan was present. There is acute pulmonary embolism to the right upper and right lower lobes, visualized on images 117, 164 and 177.  There is chronic cardiomegaly and right heart enlargement, which limits utility of the RV to LV ratio. The main pulmonary artery is chronically dilated at 37 mm. There is no progression of these findings to suggest acute right heart failure. No pericardial effusion. No aortic dissection. There is chronic mild enlargement of upper mediastinal lymph nodes.  LUNG WINDOWS:  Patchy ground-glass density in the bilateral lungs, with lower lung subsegmental atelectasis or scarring that is stable from prior and likely fixed, chronic lung disease. No lung infarct noted.  UPPER ABDOMEN:  Splenic auto infarction.  OSSEOUS:  Typical osseous changes of sickle cell disease.  Critical Value/emergent results were called by telephone at the time of interpretation on 03/11/2014 at 2:00 pm to Dr. Charlynne Cousins , who verbally acknowledged these results.  Review of the MIP images confirms the  above findings.  IMPRESSION: Acute on chronic pulmonary embolism with diffuse subsegmental clot. Chronic pulmonary artery hypertension; no morphologic change to suggest acute right heart failure.   Electronically Signed   By: Monte Fantasia M.D.   On: 03/11/2014 14:08   Nm Pulmonary Perf And Vent  03/14/2014   CLINICAL DATA:  Hypoxia.  Sickle cell disease  EXAM: NUCLEAR MEDICINE VENTILATION - PERFUSION LUNG SCAN  Views: Anterior, posterior, left lateral, right lateral, RPO, LPO, RAO, LAO -ventilation and perfusion  Radionuclide: Technetium 14m DTPA -ventilation; Technetium 85m macroaggregated albumin-perfusion  Dose:  35.3 mCi-ventilation; 5.3 mCi-perfusion  Route of administration: Inhalation-ventilation; intravenous-perfusion  COMPARISON:  Chest radiograph March 14, 2014; prior ventilation and perfusion lung scan Jun 19, 2013 ; chest CT angiogram March 11, 2014  FINDINGS: Ventilation: Radiotracer uptake is homogeneous and symmetric bilaterally. Cardiomegaly is noted. No focal ventilation defects are identified. The appearance is stable compared to prior lung scan.  Perfusion: This patient has known peripheral chronic pulmonary emboli based on CT appearance. On the perfusion study, there are peripheral small wedge-shaped defects, stable from prior lung scan consistent with chronic pulmonary emboli in the periphery as noted on recent CT as well. There is no segmental perfusion defect. The appearance is stable compared to prior study.  IMPRESSION: Evidence of small peripheral chronic emboli bilaterally based on prior lung scan and recent chest CT angiogram appearance. No new pulmonary embolus is appreciable on this study. No segmental perfusion defects seen currently. No ventilation defects identified. Cardiomegaly is noted.   Electronically Signed   By: Lowella Grip III M.D.   On: 03/14/2014 18:41    Scheduled Meds: . sodium chloride   Intravenous Once  . cholecalciferol  2,000 Units Oral Daily   . diltiazem  120 mg Oral Daily  . folic acid  1 mg Oral q morning - 10a  . HYDROmorphone PCA 2 mg/mL   Intravenous 6 times per day  . hydroxyurea  1,500 mg Oral Q breakfast  . ketorolac  30 mg Intravenous 4 times per day  . lisinopril  10 mg Oral Daily  . metoprolol tartrate  25 mg Oral Daily  . morphine  30 mg Oral Q12H  . potassium chloride SA  20 mEq Oral q morning - 10a  . rivaroxaban  15 mg Oral BID WC   Continuous Infusions: . dextrose 5 %  and 0.45% NaCl 75 mL/hr at 03/16/14 2035    Principal Problem:   Pulmonary embolus Active Problems:   Vitamin D deficiency   Hypokalemia   Embolism, pulmonary with infarction   Hb-SS disease with crisis   Demand ischemia of myocardium   Sickle cell pain crisis   Hypoxia   Assessment/Plan: Principal Problem:   Pulmonary embolus Active Problems:   Vitamin D deficiency   Hypokalemia   Embolism, pulmonary with infarction   Hb-SS disease with crisis   Demand ischemia of myocardium   Sickle cell pain crisis   Hypoxia  1.   Respiratory: V/Q scan showing chronic PE, previous CT showing acute on chronic PE.         -Pt is stable and is breathing comfortably with supplemental oxygen.  -Continue Xarelto at therapeutic PE dose of 15mg  twice dailyfor 21 days (2/13-04/05/14) -Pt also has pulmonary HTN that has likely worsened over time. No past right heart cath performed. From a previous cardiology consult note, invasive RHC was not pursued due to pt's history of non-compliance. He previously did not required oxygen for pHTN, but has not been seen in [facility]. His RV pressures have increased from 62 on 01/02/14 to 76 on 03/15/14 TTE. CXR obtained today, shows no intrapulmonary process. Making acute chest sx further unlikely. Placed consult to pulmonology to see if we can optimize his care with medications. Cardiology-Dr. Acie Fredrickson did not feel they could be helpful due to the underlying etiology. -Supplemental O2 as needed to keep O2 sats  above 95%. 2. Cardiac: Troponin of 2.59 on admission likely due to demand ischemia. EKG with no ST elevations or arrythmias. Pt previously had Afib with RVR that resolved during his last admission. -No events on tele. Pt was not transferred out yesterday but will be transferred to med-surg today. -Continue diltiazem -HTN is controlled. Continue home Lisinopril and Metoprolol        -Repeat Troponin was 2.19 today, down-trended from 2.19.  3. Heme: -Sickle Cell Anemia with crisis: Pain has improved. Will continue Dilaudid PCA and Toradol. Continue MS contin for long acting.  -Anemia: Hgb of  8.4 after 3 units PRBCs. LDH elevated at 503, showing evidence of hemolysis. CXR with no signs of pulmonary edema. Will transfuse him 1 unit tonight, goal of 9-10 to reduce cardio-pulmonary strain.  -Continue his routine sickle cell care with folic acid and hydroxyurea.  4. ID: No fevers, Leukocytosis: Trending up, now at 16.3. Likely secondary to crisis. Will monitor.    5. FEN/GI :D5/0.45% NS-continue at 63ml/hr now that he is eating. Continue KDur for h/o hypokalemia and vitD for his vitD deficiency. regular diet.  Bowel regimen in place  Time Spent:35 minutes. Code Status: Full code DVT Prophylaxis: on Xarelto Family Communication: none Disposition Plan: pending improvement  Kalman Shan  Pager [phone]. If 7PM-7AM, please contact night-coverage.  03/16/2014, 12:48 PM  LOS: 2 days   Kalman Shan

## 2014-03-17 DIAGNOSIS — R42 Dizziness and giddiness: Secondary | ICD-10-CM | POA: Diagnosis not present

## 2014-03-17 DIAGNOSIS — R4781 Slurred speech: Secondary | ICD-10-CM | POA: Diagnosis not present

## 2014-03-17 DIAGNOSIS — R0602 Shortness of breath: Secondary | ICD-10-CM | POA: Diagnosis not present

## 2014-03-17 DIAGNOSIS — I5022 Chronic systolic (congestive) heart failure: Secondary | ICD-10-CM | POA: Diagnosis not present

## 2014-03-17 DIAGNOSIS — I2699 Other pulmonary embolism without acute cor pulmonale: Secondary | ICD-10-CM | POA: Diagnosis not present

## 2014-03-17 DIAGNOSIS — I272 Other secondary pulmonary hypertension: Secondary | ICD-10-CM | POA: Diagnosis not present

## 2014-03-17 DIAGNOSIS — R4702 Dysphasia: Secondary | ICD-10-CM | POA: Diagnosis not present

## 2014-03-17 DIAGNOSIS — E559 Vitamin D deficiency, unspecified: Secondary | ICD-10-CM | POA: Diagnosis not present

## 2014-03-17 LAB — CBC WITH DIFFERENTIAL/PLATELET
BASOS PCT: 0 % (ref 0–1)
Basophils Absolute: 0 10*3/uL (ref 0.0–0.1)
EOS ABS: 0.2 10*3/uL (ref 0.0–0.7)
Eosinophils Relative: 1 % (ref 0–5)
HEMATOCRIT: 18.7 % — AB (ref 39.0–52.0)
Hemoglobin: 6.4 g/dL — CL (ref 13.0–17.0)
LYMPHS ABS: 0.8 10*3/uL (ref 0.7–4.0)
Lymphocytes Relative: 5 % — ABNORMAL LOW (ref 12–46)
MCH: 31.1 pg (ref 26.0–34.0)
MCHC: 34.2 g/dL (ref 30.0–36.0)
MCV: 90.8 fL (ref 78.0–100.0)
Monocytes Absolute: 1.1 10*3/uL — ABNORMAL HIGH (ref 0.1–1.0)
Monocytes Relative: 7 % (ref 3–12)
NEUTROS ABS: 13.5 10*3/uL — AB (ref 1.7–7.7)
NEUTROS PCT: 87 % — AB (ref 43–77)
PLATELETS: 265 10*3/uL (ref 150–400)
RBC: 2.06 MIL/uL — ABNORMAL LOW (ref 4.22–5.81)
RDW: 22.3 % — ABNORMAL HIGH (ref 11.5–15.5)
WBC: 15.6 10*3/uL — ABNORMAL HIGH (ref 4.0–10.5)

## 2014-03-17 LAB — TYPE AND SCREEN
ABO/RH(D): O POS
Antibody Screen: NEGATIVE
UNIT DIVISION: 0
UNIT DIVISION: 0
UNIT DIVISION: 0
Unit division: 0

## 2014-03-17 LAB — HEMOGLOBIN AND HEMATOCRIT, BLOOD
HEMATOCRIT: 26.4 % — AB (ref 39.0–52.0)
Hemoglobin: 9.2 g/dL — ABNORMAL LOW (ref 13.0–17.0)

## 2014-03-17 LAB — BASIC METABOLIC PANEL
Anion gap: 6 (ref 5–15)
BUN: 8 mg/dL (ref 6–23)
CO2: 21 mmol/L (ref 19–32)
Calcium: 8.3 mg/dL — ABNORMAL LOW (ref 8.4–10.5)
Chloride: 104 mmol/L (ref 96–112)
Creatinine, Ser: 0.43 mg/dL — ABNORMAL LOW (ref 0.50–1.35)
Glucose, Bld: 127 mg/dL — ABNORMAL HIGH (ref 70–99)
Potassium: 3.8 mmol/L (ref 3.5–5.1)
Sodium: 131 mmol/L — ABNORMAL LOW (ref 135–145)

## 2014-03-17 LAB — LACTATE DEHYDROGENASE: LDH: 514 U/L — AB (ref 94–250)

## 2014-03-17 LAB — RETICULOCYTES
RBC.: 2.95 MIL/uL — AB (ref 4.22–5.81)
Retic Count, Absolute: 268.5 10*3/uL — ABNORMAL HIGH (ref 19.0–186.0)
Retic Ct Pct: 9.1 % — ABNORMAL HIGH (ref 0.4–3.1)

## 2014-03-17 LAB — PHOSPHORUS: Phosphorus: 4.1 mg/dL (ref 2.3–4.6)

## 2014-03-17 LAB — MAGNESIUM: MAGNESIUM: 1.5 mg/dL (ref 1.5–2.5)

## 2014-03-17 MED ORDER — SODIUM CHLORIDE 0.9 % IV SOLN
INTRAVENOUS | Status: DC
  Administered 2014-03-17 – 2014-03-18 (×2): via INTRAVENOUS
  Administered 2014-03-19 – 2014-03-20 (×2): 50 mL/h via INTRAVENOUS

## 2014-03-17 MED ORDER — CHLORHEXIDINE GLUCONATE CLOTH 2 % EX PADS
6.0000 | MEDICATED_PAD | Freq: Every day | CUTANEOUS | Status: DC
  Administered 2014-03-17 – 2014-03-24 (×8): 6 via TOPICAL

## 2014-03-17 MED ORDER — ZOLPIDEM TARTRATE 5 MG PO TABS
5.0000 mg | ORAL_TABLET | Freq: Every evening | ORAL | Status: DC | PRN
  Administered 2014-03-18 – 2014-03-23 (×5): 5 mg via ORAL
  Filled 2014-03-17 (×6): qty 1

## 2014-03-17 MED ORDER — MUPIROCIN 2 % EX OINT
TOPICAL_OINTMENT | Freq: Two times a day (BID) | CUTANEOUS | Status: DC
  Administered 2014-03-17 (×2): via NASAL
  Administered 2014-03-18: 1 via NASAL
  Administered 2014-03-18 – 2014-03-19 (×3): via NASAL
  Administered 2014-03-20: 1 via NASAL
  Administered 2014-03-20 – 2014-03-21 (×2): via NASAL
  Administered 2014-03-21: 1 via NASAL
  Administered 2014-03-22 – 2014-03-24 (×5): via NASAL
  Filled 2014-03-17 (×4): qty 22

## 2014-03-17 NOTE — Consult Note (Signed)
Name: [name] MRN: [id number] DOB: 08/13/1980    ADMISSION DATE:  03/14/2014 CONSULTATION DATE:  03/17/14  REFERRING MD :  Dr. Alben Deeds / [facility]   CHIEF COMPLAINT:  Hypoxemia, Dyspnea, PAH   BRIEF PATIENT DESCRIPTION: 34 y/o M with PMH of Sickle Cell Disease, Recurrent PE and medical non-compliance admitted on 2/12 with worsening SOB.  Found to be hypoxic and admitted for further work up.  PCCM consulted for increasing PA pressures on ECHO.    SIGNIFICANT EVENTS  2/12  Re-admit with SOB, hypoxemia (sats 85%).  Clear cxr on admit.  2/15  PCCM consulted for evaluation of Echo   STUDIES:  2/09  CTA Chest >> acute on chronic PE with diffuse subsegmental clot, chronic PAH, no right heart failure 2/13  ECHO >> nml LV, mild LVH, EF 50-55%, no RWMA, RV mildly dilated, PA peak 76 ( 80 in 5/15, 62 in 12/15)    HISTORY OF PRESENT ILLNESS:  34 y/o M, former smoker (quit 2012), ETOH / marijuana abuse with a PMH of sickle cell anemia, HTN, R hip replacement secondary to avascular necrosis, unspecified mood disorder, Afib, recurrent PE (most recent 12/15) on Xarelto and medical non-compliance who presented to the [facility] ER on 2/12 via EMS with reports of dizziness, weakness and SOB.  He was recently discharged from [facility] on 2/11 with left sided chest pain.  He was diagnosed with acute on chronic PE at that time and Afib with RVR, troponin peak of 9.3.   Initial evaluation in ER was notable for room air saturations were in the mid 80's.  He responded well to 2L O2.  The patient was admitted for further evaluation.  He was treated with IVF and pain control.  ECHO was reassessed with PA peak of 76. Prior [facility] of 80 in 5/15 & 62 in 12/15.  PCCM was consulted for evaluation of [place].     PAST MEDICAL HISTORY :   has a past medical history of Sickle cell anemia; Blood transfusion; Acute embolism and thrombosis of right internal jugular vein; Hypokalemia; Mood disorder; History of pulmonary embolus (PE);  Avascular necrosis; Leukocytosis; Thrombocytosis; Hypertension; History of Clostridium difficile infection; Uses marijuana; Chronic anticoagulation; Functional asplenia; Former smoker; Second hand tobacco smoke exposure; Alcohol consumption of one to four drinks per day; Noncompliance with medication regimen; Sickle-cell crisis with associated acute chest syndrome (05/13/2013); Acute chest syndrome (06/18/2013); and Demand ischemia (01/02/2014).  has past surgical history that includes Right hip replacement; Cholecystectomy; Porta cath placement; Porta cath removal; Umbilical hernia repair; Excision of left periauricular cyst; Excision of right ear lobe cyst with primary closur; and Portacath placement (01/05/2012).   HOME MEDICATIONS:  Prior to Admission medications   Medication Sig Start Date End Date Taking? Authorizing Provider  Cholecalciferol (VITAMIN D) 2000 UNITS tablet Take 1 tablet (2,000 Units total) by mouth daily. 02/13/14  Yes [name], MD  diltiazem (CARDIZEM CD) 120 MG 24 hr capsule Take 1 capsule (120 mg total) by mouth daily. 03/13/14  Yes Costin Karlyne Greenspan, MD  folic acid (FOLVITE) 1 MG tablet Take 1 tablet (1 mg total) by mouth every morning. 10/08/13  Yes [name], MD  HYDROmorphone (DILAUDID) 4 MG tablet Take 1 tablet (4 mg total) by mouth every 4 (four) hours as needed for severe pain. 03/13/14  Yes Costin Karlyne Greenspan, MD  hydroxyurea (HYDREA) 500 MG capsule Take 3 capsules (1,500 mg total) by mouth daily. May take with food to minimize GI side effects.  10/30/13  Yes [name], MD  lisinopril (PRINIVIL,ZESTRIL) 10 MG tablet Take 1 tablet (10 mg total) by mouth daily. 01/22/14  Yes [name], MD  metoprolol tartrate (LOPRESSOR) 25 MG tablet Take 1 tablet (25 mg total) by mouth daily. 08/22/13  Yes [name], MD  morphine (MS CONTIN) 30 MG 12 hr tablet Take 1 tablet (30 mg total) by mouth every 12 (twelve) hours. 02/26/14  Yes [name], MD  potassium chloride SA (K-DUR,KLOR-CON) 20 MEQ tablet Take 1 tablet (20 mEq total) by mouth every morning. 09/25/13  Yes [name], MD  rivaroxaban (XARELTO) 20 MG TABS tablet Take 1 tablet (20 mg total) by mouth every morning. 03/13/14  Yes Costin Karlyne Greenspan, MD  zolpidem (AMBIEN) 10 MG tablet Take 1 tablet (10 mg total) by mouth at bedtime as needed for sleep. 10/28/13  Yes Dorena Dew, FNP   No Known Allergies  FAMILY HISTORY:  family history includes Depression in his mother; Diabetes in his mother; Sickle cell trait in his brother, father, and mother.   SOCIAL HISTORY:  reports that he quit smoking about 3 years ago. He has never used smokeless tobacco. He reports that he drinks about 1.2 oz of alcohol per week. He reports that he uses illicit drugs (Marijuana) about twice per week.  REVIEW OF SYSTEMS:   Constitutional: Negative for fever, chills, weight loss, and diaphoresis. Reports weakness / fatigue HENT: Negative for hearing loss, ear pain, nosebleeds, congestion, sore throat, neck pain, tinnitus and ear discharge.   Eyes: Negative for blurred vision, double vision, photophobia, pain, discharge and redness.  Respiratory: Negative for cough, hemoptysis, sputum production, wheezing and stridor.  Reports shortness of breath Cardiovascular: Negative for chest pain, palpitations, orthopnea, claudication, leg swelling and PND.  Gastrointestinal: Negative for heartburn, nausea, vomiting, abdominal pain, diarrhea, constipation, blood in stool and melena.  Genitourinary: Negative for dysuria, urgency, frequency, hematuria and flank pain.  Musculoskeletal: Negative for myalgias, back pain, joint pain and falls.  Skin: Negative for itching and rash.  Neurological: Negative for tingling, tremors, sensory change, speech change, focal weakness, seizures, loss of consciousness, weakness and headaches. Reports dizziness / lightheadedness  Endo/Heme/Allergies: Negative for  environmental allergies and polydipsia. Does not bruise/bleed easily.  SUBJECTIVE: Pt denies current acute complaints.    VITAL SIGNS: Temp:  [98 F (36.7 C)-98.6 F (37 C)] 98.2 F (36.8 C) (02/15 0945) Pulse Rate:  [76-86] 85 (02/15 0945) Resp:  [15-19] 15 (02/15 0945) BP: (113-128)/(75-89) 122/80 mmHg (02/15 0945) SpO2:  [94 %-99 %] 97 % (02/15 0945)  PHYSICAL EXAMINATION: General:  Thin adult male in NAD Neuro:  AAOx4, speech clear, MAE.   PSY:  Poor eye contact, flat affect HEENT:  Mm pink/moist Cardiovascular:  s1s2 rrr, no m/r/g Lungs:  Even/non-labored, lungs bilaterally clear  Abdomen:  Flat, soft, bsx4 active  Musculoskeletal:  No acute deformities Skin:  Warm/dry, no edema   Recent Labs Lab 03/15/14 0830 03/16/14 0640 03/17/14 0809  NA 136 137 131*  K 3.1* 3.9 3.8  CL 109 109 104  CO2 22 22 21   BUN <5* 8 8  CREATININE 0.53 0.50 0.43*  GLUCOSE 98 116* 127*    Recent Labs Lab 03/15/14 0830  03/16/14 0640 03/17/14 0635 03/17/14 0809  HGB 6.8*  < > 8.4* 6.4* 9.2*  HCT 19.5*  < > 23.5* 18.7* 26.4*  WBC 12.7*  --  16.3* 15.6*  --   PLT 322  --  328  265  --   < > = values in this interval not displayed. Dg Chest 2 View  03/16/2014   CLINICAL DATA:  Shortness of breath and chest pain. Sickle cell patient.  EXAM: CHEST  2 VIEW  COMPARISON:  03/14/2014  FINDINGS: Left anterior chest wall Port-A-Cath is present with tip projecting over the superior cavoatrial junction. Stable cardiomegaly. No consolidative pulmonary opacities. No pleural effusion or pneumothorax.  IMPRESSION: No acute cardiopulmonary process.   Electronically Signed   By: Lovey Newcomer M.D.   On: 03/16/2014 15:14    ASSESSMENT / PLAN:  PAH - in setting of sickle cell disease, chronic hypoxia, and pulmonary embolism Sickle Cell Anemia - no evidence of acute chest syndrome Dyspnea  Hypoxemia  Acute on Chronic PE - recurrent PE noted on CTA chest 2/9.  Patient endorses "missing a few doses" of  anticoagulation  Plan: Patient is not a candidate for PAH medication Rx Not likely a candidate for RHC given medication non-compliance Wean oxygen for saturations >92% Will need ambulatory saturation assessment prior to discharge Stressed importance of adherence to anticoagulation regimen  Recommend smoking cessation / marijuana cessation  Transfusions per Sickle Cell Medicine Team   PCCM will be available PRN.  Please call if new needs arise.     Noe Gens, NP-C [facility] Pgr: [phone] or [phone] 03/17/2014, 10:16 AM  Attending Note:  I have examined patient, reviewed labs, studies and notes. I have discussed the case with B Ollis, and I agree with the data and plans as amended above.  PAH is due to microvascular disease in setting of sickle cell disease, as well as macrovascular disease due to chronic PE. The treatments here should be anti-coagulation (as long as he can tolerate) and oxygen to reach goal SpO2 > 90%. No role for targeted PAH therapy.   Baltazar Apo, MD, PhD 03/18/2014, 9:31 AM [facility] [phone] or if no answer [phone]

## 2014-03-17 NOTE — Progress Notes (Signed)
Patient ID: [name], male   DOB: [birth year], 34 y.o.   MRN: [id number]  SICKLE CELL SERVICE PROGRESS NOTE  [name] MCN:[id number] DOB: 04-Dec-1980 DOA: 03/14/2014 PCP: [name],[name], MD   Presenting HPI: [name] is a 34yo male with sickle cell disease who presented with complaints of SOB. He was recently hospitalized from 2/9-2/11 for acute on chronic PE seen on CT angio from 2/9. He was sent home yesterday, and reports doing well initially but around 3am this morning he woke up feeling SOB. He then went back to sleep but arose this morning still feeling SOB, dizzy (lightheaded), and having worsening chest and back pain. He then came the ED. Pt states he has taken all of his meds including Xarelto. He denies any fever, cough, nausea, vomiting, heart palpitation, weakness, or LOC.  In the ED, he was hypoxic down to 85% o2 sat. He was placed on 5LPM via nasal cannula. A CXR was performed showing no acute opacities. Troponin was 2.59 and Hgb of 7.5. He was given a few doses of IV Dilaudid for his pain.  Consultants:  None  Procedures:  TTE AND V/Q SCAN this admission  Antibiotics:  none  HPI/Subjective: Pt reports that his right sided chest pain is slightly better at a 5-6/10. He feels that his breathing is about the same. He is maintaining an spO2 >94% on 3L [place]. He received another unit of blood last night.  Objective: Filed Vitals:   03/17/14 0628 03/17/14 0800 03/17/14 0945 03/17/14 1200  BP: 119/87  122/80   Pulse: 81  85   Temp: 98.6 F (37 C)  98.2 F (36.8 C)   TempSrc: Oral  Oral   Resp: 18 16 15 14   Height:      Weight:      SpO2: 96% 99% 97% 99%   Weight change:   Intake/Output Summary (Last 24 hours) at 03/17/14 1319 Last data filed at 03/17/14 1209  Gross per 24 hour  Intake    655 ml  Output   1275 ml  Net   -620 ml    General: Alert, awake, oriented x3, in no acute distress.  HEENT: [place]/AT PEERL, EOMI, nasal cannula in  place OROPHARYNX:  Moist, No exudate/ erythema/lesions.  Heart: Regular rate and rhythm, without murmurs, rubs, gallops Lungs: Clear to auscultation, no wheezing or rhonchi noted. Abdomen: Soft, nontender, nondistended, positive bowel sounds, no masses no hepatosplenomegaly noted. Neuro: No focal neurological deficits noted  Extremities: no edema or cyanosis -unchanged  Data Reviewed: Basic Metabolic Panel:  Recent Labs Lab 03/13/14 0649 03/14/14 1332 03/15/14 0830 03/16/14 0640 03/17/14 0809  NA 137 135 136 137 131*  K 4.1 3.7 3.1* 3.9 3.8  CL 109 108 109 109 104  CO2 22 22 22 22 21   GLUCOSE 102* 119* 98 116* 127*  BUN 6 6 <5* 8 8  CREATININE 0.41* 0.47* 0.53 0.50 0.43*  CALCIUM 8.3* 8.5 7.5* 8.3* 8.3*  MG  --   --  1.4*  --  1.5  PHOS  --   --   --   --  4.1   Liver Function Tests:  Recent Labs Lab 03/11/14 0500 03/13/14 0649 03/14/14 1955 03/15/14 0830  AST 44* 35 46* 33  ALT 26 20 20 17   ALKPHOS 93 76 81 64  BILITOT 9.4* 5.6* 5.1* 4.9*  PROT 7.3 7.0 7.4 6.0  ALBUMIN 3.8 3.9 3.8 3.0*   CBC:  Recent Labs Lab 03/11/14 0500  03/13/14  8338 03/14/14 1332 03/15/14 0830 03/15/14 1344 03/16/14 0640 03/17/14 0635 03/17/14 0809  WBC 23.9*  < > 17.0* 14.6* 12.7*  --  16.3* 15.6*  --   NEUTROABS 19.9*  --   --   --  8.5*  --  12.7* 13.5*  --   HGB 8.1*  < > 7.6* 7.5* 6.8* 8.3* 8.4* 6.4* 9.2*  HCT 22.7*  < > 22.0* 21.3* 19.5* 24.0* 23.5* 18.7* 26.4*  MCV 93.0  < > 97.3 95.9 93.8  --  90.4 90.8  --   PLT 411*  < > 395 381 322  --  328 265  --   < > = values in this interval not displayed.  Recent Labs  01/01/14 0909  PROBNP 5790.0*   Studies: Dg Chest 2 View  03/16/2014   CLINICAL DATA:  Shortness of breath and chest pain. Sickle cell patient.  EXAM: CHEST  2 VIEW  COMPARISON:  03/14/2014  FINDINGS: Left anterior chest wall Port-A-Cath is present with tip projecting over the superior cavoatrial junction. Stable cardiomegaly. No consolidative pulmonary  opacities. No pleural effusion or pneumothorax.  IMPRESSION: No acute cardiopulmonary process.   Electronically Signed   By: Lovey Newcomer M.D.   On: 03/16/2014 15:14   Dg Chest 2 View  03/14/2014   CLINICAL DATA:  Dizzy and weak, history of sickle cell  EXAM: CHEST  2 VIEW  COMPARISON:  03/11/2014  FINDINGS: Cardiac shadow remains enlarged. A left chest wall port is again seen. The lungs are clear without focal infiltrate. The bony structures are within normal limits.  IMPRESSION: No acute abnormality is noted. The patient does have a recent diagnosis of acute on chronic pulmonary embolism on 03/11/2014   Electronically Signed   By: Inez Catalina M.D.   On: 03/14/2014 14:58   Dg Chest 2 View  03/11/2014   CLINICAL DATA:  Acute onset of shortness of breath. Decreased O2 saturation. Generalized weakness and pain. Initial encounter.  EXAM: CHEST  2 VIEW  COMPARISON:  Chest radiograph performed 03/06/2014  FINDINGS: The lungs are well-aerated. Vascular congestion is noted. Minimal bilateral atelectasis is seen. There is no evidence of pleural effusion or pneumothorax.  The heart is mildly enlarged. A left-sided chest port is noted ending about the cavoatrial junction. No acute osseous abnormalities are seen.  IMPRESSION: Vascular congestion and mild cardiomegaly. Minimal bilateral atelectasis seen.   Electronically Signed   By: Garald Balding M.D.   On: 03/11/2014 06:24   Dg Chest 2 View  03/06/2014   CLINICAL DATA:  Sickle cell crisis, shortness of breath, upper chest pain for 48 hr.  EXAM: CHEST  2 VIEW  COMPARISON:  Chest radiograph February 23, 2014  FINDINGS: The cardiac silhouette is mild-to-moderately enlarged. Pulmonary vascular congestion, increased without pleural effusion or focal consolidation. No pneumothorax.  LEFT chest Port-A-Cath with distal tip projecting cavoatrial junction. No pneumothorax. Soft tissue planes and included osseous structures are nonacute.  IMPRESSION: Mild cardiomegaly and  pulmonary vascular congestion without focal consolidation.   Electronically Signed   By: Elon Alas   On: 03/06/2014 03:12   Dg Chest 2 View  02/23/2014   CLINICAL DATA:  Sickle cell crisis, cough, congestion  EXAM: CHEST  2 VIEW  COMPARISON:  02/03/2014  FINDINGS: Borderline cardiomegaly again noted. Stable central mild vascular congestion. No definite superimposed infiltrate or pulmonary edema. Stable left Port-A-Cath position. Bony thorax is stable.  IMPRESSION: Borderline cardiomegaly. Central vascular congestion without convincing pulmonary edema. No segmental infiltrate.  Electronically Signed   By: Lahoma Crocker M.D.   On: 02/23/2014 09:36   Ct Angio Chest Pe W/cm &/or Wo Cm  03/11/2014   CLINICAL DATA:  Sickle cell disease with sudden onset left-sided chest pain.  EXAM: CT ANGIOGRAPHY CHEST WITH CONTRAST  TECHNIQUE: Multidetector CT imaging of the chest was performed using the standard protocol during bolus administration of intravenous contrast. Multiplanar CT image reconstructions and MIPs were obtained to evaluate the vascular anatomy.  CONTRAST:  12mL OMNIPAQUE IOHEXOL 350 MG/ML SOLN  COMPARISON:  01/01/2014  FINDINGS: THORACIC INLET/BODY WALL:  Left subclavian porta catheter, tip in the right atrium.  MEDIASTINUM:  There is multi focal pulmonary embolism with clot primarily subsegmental but intermittently occlusive. Clot in the posterior segment left upper lobe on image 92, and in the left lower lobe on image 175 and 183 is chronic and without progression from 01/01/2014. The lower lobe clot was previously obscured by artifact, but with the benefit of the current scan was present. There is acute pulmonary embolism to the right upper and right lower lobes, visualized on images 117, 164 and 177.  There is chronic cardiomegaly and right heart enlargement, which limits utility of the RV to LV ratio. The main pulmonary artery is chronically dilated at 37 mm. There is no progression of these  findings to suggest acute right heart failure. No pericardial effusion. No aortic dissection. There is chronic mild enlargement of upper mediastinal lymph nodes.  LUNG WINDOWS:  Patchy ground-glass density in the bilateral lungs, with lower lung subsegmental atelectasis or scarring that is stable from prior and likely fixed, chronic lung disease. No lung infarct noted.  UPPER ABDOMEN:  Splenic auto infarction.  OSSEOUS:  Typical osseous changes of sickle cell disease.  Critical Value/emergent results were called by telephone at the time of interpretation on 03/11/2014 at 2:00 pm to Dr. Charlynne Cousins , who verbally acknowledged these results.  Review of the MIP images confirms the above findings.  IMPRESSION: Acute on chronic pulmonary embolism with diffuse subsegmental clot. Chronic pulmonary artery hypertension; no morphologic change to suggest acute right heart failure.   Electronically Signed   By: Monte Fantasia M.D.   On: 03/11/2014 14:08   Nm Pulmonary Perf And Vent  03/14/2014   CLINICAL DATA:  Hypoxia.  Sickle cell disease  EXAM: NUCLEAR MEDICINE VENTILATION - PERFUSION LUNG SCAN  Views: Anterior, posterior, left lateral, right lateral, RPO, LPO, RAO, LAO -ventilation and perfusion  Radionuclide: Technetium 23m DTPA -ventilation; Technetium 70m macroaggregated albumin-perfusion  Dose:  35.3 mCi-ventilation; 5.3 mCi-perfusion  Route of administration: Inhalation-ventilation; intravenous-perfusion  COMPARISON:  Chest radiograph March 14, 2014; prior ventilation and perfusion lung scan Jun 19, 2013 ; chest CT angiogram March 11, 2014  FINDINGS: Ventilation: Radiotracer uptake is homogeneous and symmetric bilaterally. Cardiomegaly is noted. No focal ventilation defects are identified. The appearance is stable compared to prior lung scan.  Perfusion: This patient has known peripheral chronic pulmonary emboli based on CT appearance. On the perfusion study, there are peripheral small wedge-shaped  defects, stable from prior lung scan consistent with chronic pulmonary emboli in the periphery as noted on recent CT as well. There is no segmental perfusion defect. The appearance is stable compared to prior study.  IMPRESSION: Evidence of small peripheral chronic emboli bilaterally based on prior lung scan and recent chest CT angiogram appearance. No new pulmonary embolus is appreciable on this study. No segmental perfusion defects seen currently. No ventilation defects identified. Cardiomegaly is noted.  Electronically Signed   By: Lowella Grip III M.D.   On: 03/14/2014 18:41    Scheduled Meds: . sodium chloride   Intravenous Once  . sodium chloride   Intravenous Once  . Chlorhexidine Gluconate Cloth  6 each Topical Q0600  . cholecalciferol  2,000 Units Oral Daily  . diltiazem  120 mg Oral Daily  . folic acid  1 mg Oral q morning - 10a  . HYDROmorphone PCA 2 mg/mL   Intravenous 6 times per day  . hydroxyurea  1,500 mg Oral Q breakfast  . ketorolac  30 mg Intravenous 4 times per day  . lisinopril  10 mg Oral Daily  . metoprolol tartrate  25 mg Oral Daily  . morphine  30 mg Oral Q12H  . mupirocin ointment   Nasal BID  . potassium chloride SA  20 mEq Oral q morning - 10a  . rivaroxaban  15 mg Oral BID WC   Continuous Infusions: . sodium chloride 50 mL/hr at 03/17/14 1159    Principal Problem:   Pulmonary embolus Active Problems:   Vitamin D deficiency   Hypokalemia   Embolism, pulmonary with infarction   Hb-SS disease with crisis   Demand ischemia of myocardium   Sickle cell pain crisis   Hypoxia   Assessment/Plan: Principal Problem:   Pulmonary embolus Active Problems:   Vitamin D deficiency   Hypokalemia   Embolism, pulmonary with infarction   Hb-SS disease with crisis   Demand ischemia of myocardium   Sickle cell pain crisis   Hypoxia  1.   Respiratory: V/Q scan showing chronic PE, previous CT showing acute on chronic PE.   -Pt is stable and breathing  comfortably with supplemental oxygen.  -Continue Xarelto at therapeutic PE dose of 15mg  twice dailyfor 21 days (2/13-04/05/14), then switch back to 20mg  daily. -Pt also has pulmonary HTN that has likely worsened over time. No past right heart cath performed. From a previous cardiology consult note, invasive RHC was not pursued due to pt's history of non-compliance. He previously did not required oxygen for pHTN, but has not been seen in [facility]. His RV pressures have increased from 62 on 01/02/14 to 76 on 03/15/14 TTE. CXR X 2 shows no intrapulmonary process. Making acute chest sx further unlikely. Consulted pulmonology, who does not think his pHTN can be treated with meds. Recommend supplemental O2 to keep o2 sat above 92%. Cardiology-Dr. Acie Fredrickson did not feel they could be helpful due to the underlying etiology. -Supplemental O2 as needed to keep O2 sats above 92%. -Pt will need home oxygen assessment prior to discharge. 2. Cardiac: Troponin of 2.59 on admission likely due to demand ischemia. EKG with no ST elevations or arrythmias. Pt previously had Afib with RVR that resolved during his last admission. -No events on tele. Continue tele on med-surg floor. -Continue diltiazem -HTN is controlled. Continue home Lisinopril and Metoprolol        -Repeat Troponin was 2.19 on 2/14, down-trended from 2.59.  3. Heme: -Sickle Cell Anemia with crisis: Pain is improving. He used 42.5mg  of his Dilaudid PCA with 67 demands and 66 deliveries. Will continue Dilaudid PCA and Toradol.Consider titrating down PCA if pain is further improved tomorrow. Continue MS contin for long acting.  -Anemia: Hgb of  9.2 (goal 9-10 to reduce cardio-pulmonary burden) after 4 total PRBC units. LDH still elevated at 514, showing evidence of hemolysis.  -Continue his routine sickle cell care with folic acid and hydroxyurea.  4. ID: No fevers  with Leukocytosis: Trending down again at 15.6 today. Likely secondary to crisis. Will  monitor.   5. FEN/GI :Switched to 0.9%NS due to hyponatremia. He is eating and drinking better, so will infuse at a slow rate of 50cc/hr. Continue KDur for h/o hypokalemia and vitD for his vitD deficiency. Repeat BMP in the morning. regular diet.  Bowel regimen in place  Time Spent:25 minutes. Code Status: Full code DVT Prophylaxis: on Xarelto Family Communication: none Disposition Plan: pending improvement  Kalman Shan  Pager [phone]. If 7PM-7AM, please contact night-coverage.  03/17/2014, 1:19 PM  LOS: 3 days   Kalman Shan

## 2014-03-18 ENCOUNTER — Ambulatory Visit (HOSPITAL_COMMUNITY): Payer: Medicare Other

## 2014-03-18 ENCOUNTER — Other Ambulatory Visit: Payer: Self-pay | Admitting: Internal Medicine

## 2014-03-18 DIAGNOSIS — I2699 Other pulmonary embolism without acute cor pulmonale: Secondary | ICD-10-CM | POA: Diagnosis not present

## 2014-03-18 DIAGNOSIS — I248 Other forms of acute ischemic heart disease: Secondary | ICD-10-CM | POA: Diagnosis not present

## 2014-03-18 DIAGNOSIS — I272 Other secondary pulmonary hypertension: Secondary | ICD-10-CM | POA: Diagnosis not present

## 2014-03-18 DIAGNOSIS — I2721 Secondary pulmonary arterial hypertension: Secondary | ICD-10-CM | POA: Diagnosis present

## 2014-03-18 DIAGNOSIS — I2729 Other secondary pulmonary hypertension: Secondary | ICD-10-CM | POA: Insufficient documentation

## 2014-03-18 DIAGNOSIS — I6782 Cerebral ischemia: Secondary | ICD-10-CM | POA: Diagnosis not present

## 2014-03-18 DIAGNOSIS — R0902 Hypoxemia: Secondary | ICD-10-CM | POA: Diagnosis not present

## 2014-03-18 DIAGNOSIS — R4781 Slurred speech: Secondary | ICD-10-CM | POA: Diagnosis not present

## 2014-03-18 DIAGNOSIS — E559 Vitamin D deficiency, unspecified: Secondary | ICD-10-CM | POA: Diagnosis not present

## 2014-03-18 DIAGNOSIS — R531 Weakness: Secondary | ICD-10-CM | POA: Diagnosis not present

## 2014-03-18 DIAGNOSIS — D5700 Hb-SS disease with crisis, unspecified: Secondary | ICD-10-CM | POA: Diagnosis not present

## 2014-03-18 DIAGNOSIS — R4702 Dysphasia: Secondary | ICD-10-CM | POA: Diagnosis not present

## 2014-03-18 DIAGNOSIS — D571 Sickle-cell disease without crisis: Secondary | ICD-10-CM

## 2014-03-18 DIAGNOSIS — I5022 Chronic systolic (congestive) heart failure: Secondary | ICD-10-CM | POA: Diagnosis not present

## 2014-03-18 DIAGNOSIS — R42 Dizziness and giddiness: Secondary | ICD-10-CM | POA: Diagnosis not present

## 2014-03-18 DIAGNOSIS — R0602 Shortness of breath: Secondary | ICD-10-CM | POA: Diagnosis not present

## 2014-03-18 DIAGNOSIS — I50810 Right heart failure, unspecified: Secondary | ICD-10-CM

## 2014-03-18 LAB — BASIC METABOLIC PANEL
ANION GAP: 6 (ref 5–15)
BUN: 11 mg/dL (ref 6–23)
CALCIUM: 8.7 mg/dL (ref 8.4–10.5)
CO2: 23 mmol/L (ref 19–32)
Chloride: 106 mmol/L (ref 96–112)
Creatinine, Ser: 0.41 mg/dL — ABNORMAL LOW (ref 0.50–1.35)
GFR calc non Af Amer: >90 mL/min (ref >90)
Glucose, Bld: 119 mg/dL — ABNORMAL HIGH (ref 70–99)
POTASSIUM: 4.6 mmol/L (ref 3.5–5.1)
SODIUM: 135 mmol/L (ref 135–145)

## 2014-03-18 LAB — URINALYSIS, ROUTINE W REFLEX MICROSCOPIC
BILIRUBIN URINE: NEGATIVE
Glucose, UA: NEGATIVE mg/dL
Hgb urine dipstick: NEGATIVE
Ketones, ur: NEGATIVE mg/dL
Leukocytes, UA: NEGATIVE
NITRITE: NEGATIVE
Protein, ur: NEGATIVE mg/dL
SPECIFIC GRAVITY, URINE: 1.013 (ref 1.005–1.030)
UROBILINOGEN UA: 2 mg/dL — AB (ref 0.0–1.0)
pH: 7 (ref 5.0–8.0)

## 2014-03-18 LAB — CBC WITH DIFFERENTIAL/PLATELET
BASOS ABS: 0 10*3/uL (ref 0.0–0.1)
Basophils Relative: 0 % (ref 0–1)
EOS PCT: 2 % (ref 0–5)
Eosinophils Absolute: 0.4 10*3/uL (ref 0.0–0.7)
HEMATOCRIT: 27.5 % — AB (ref 39.0–52.0)
Hemoglobin: 9.3 g/dL — ABNORMAL LOW (ref 13.0–17.0)
Lymphocytes Relative: 7 % — ABNORMAL LOW (ref 12–46)
Lymphs Abs: 1.5 10*3/uL (ref 0.7–4.0)
MCH: 31 pg (ref 26.0–34.0)
MCHC: 33.8 g/dL (ref 30.0–36.0)
MCV: 91.7 fL (ref 78.0–100.0)
Monocytes Absolute: 1.7 10*3/uL — ABNORMAL HIGH (ref 0.1–1.0)
Monocytes Relative: 8 % (ref 3–12)
NEUTROS ABS: 17.7 10*3/uL — AB (ref 1.7–7.7)
Neutrophils Relative %: 83 % — ABNORMAL HIGH (ref 43–77)
PLATELETS: 414 10*3/uL — AB (ref 150–400)
RBC: 3 MIL/uL — AB (ref 4.22–5.81)
RDW: 22.4 % — ABNORMAL HIGH (ref 11.5–15.5)
WBC: 21.3 10*3/uL — ABNORMAL HIGH (ref 4.0–10.5)

## 2014-03-18 LAB — RETICULOCYTES
RBC.: 3 MIL/uL — AB (ref 4.22–5.81)
RETIC COUNT ABSOLUTE: 216 10*3/uL — AB (ref 19.0–186.0)
RETIC CT PCT: 7.2 % — AB (ref 0.4–3.1)

## 2014-03-18 LAB — LACTATE DEHYDROGENASE: LDH: 685 U/L — ABNORMAL HIGH (ref 94–250)

## 2014-03-18 IMAGING — CT CT ANGIO CHEST
2 of 5 series · 18 of 36 positions shown · IV contrast (APPLIED)
Comparison: Chest CTA - 12/16/2010

CLINICAL DATA: Chest pain, shortness of breath, headache,
dizziness, evaluate for pulmonary embolism

CT ANGIOGRAPHY CHEST
TECHNIQUE: Multidetector CT imaging of the chest using the
standard protocol during bolus administration of intravenous
contrast. Multiplanar reconstructed images including MIPs were
obtained and reviewed to evaluate the vascular anatomy.
Contrast:  100 ml [id number]

[Series 9: pe thins @ 1mm · axial · 0.74mm/px · z∈[-514,-260]mm · 17 of 285 slices shown]
[im 15/285  lung]
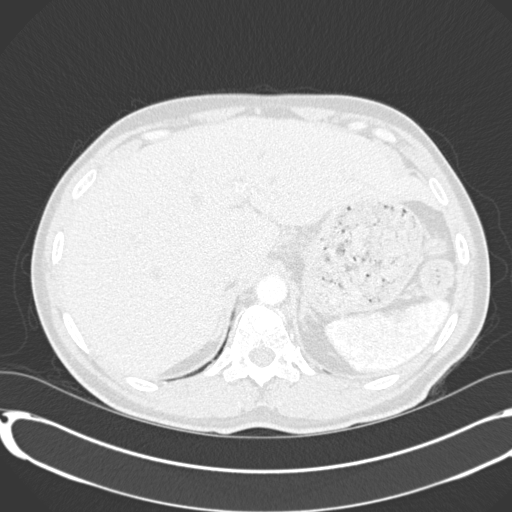
[im 29/285  mediastinal]
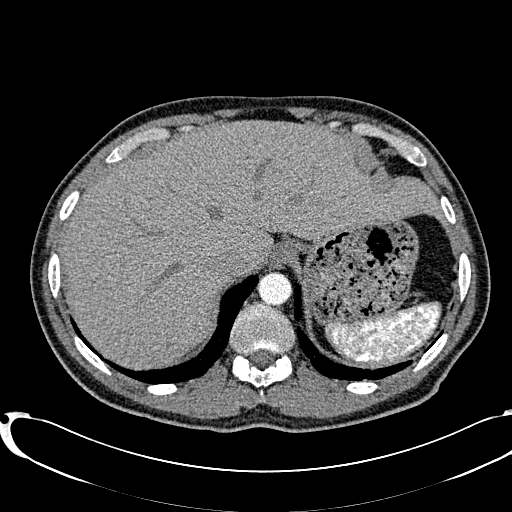
[im 43/285  lung]
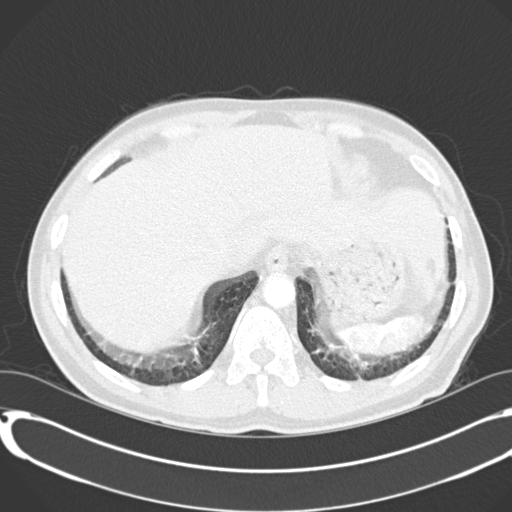
[im 57/285  mediastinal]
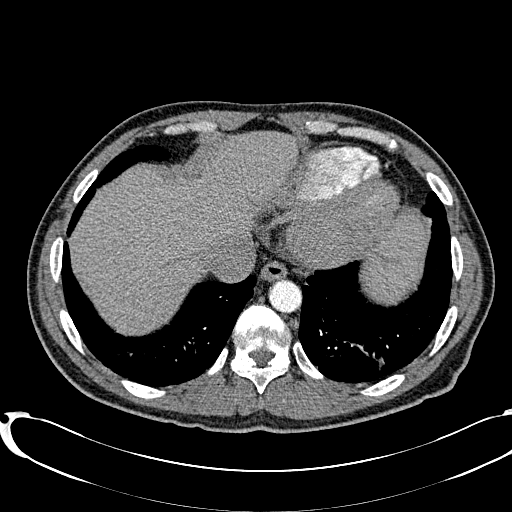
[im 86/285  lung]
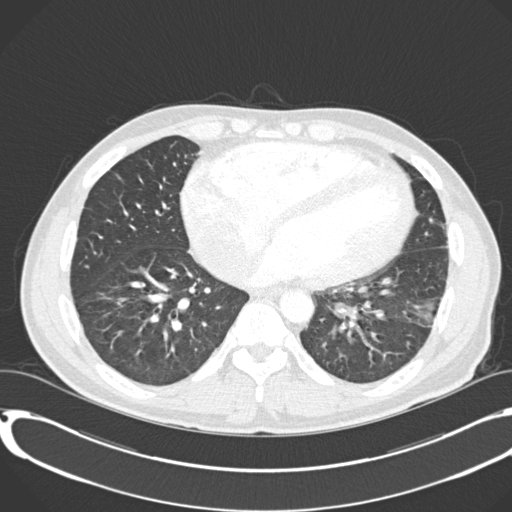
[im 100/285  mediastinal]
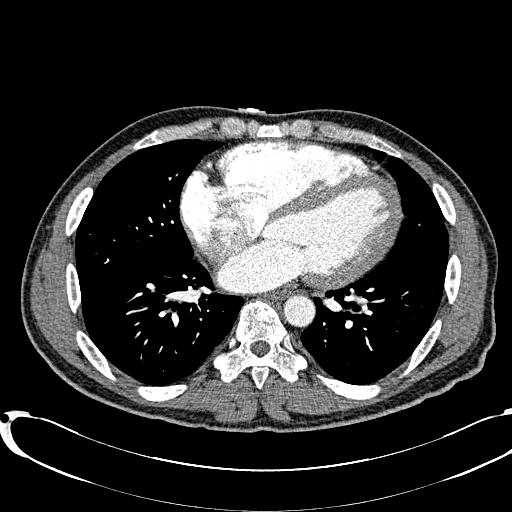
[im 114/285  lung]
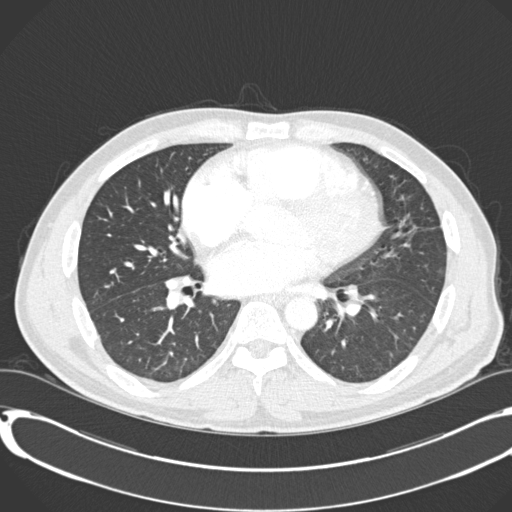
[im 128/285  mediastinal]
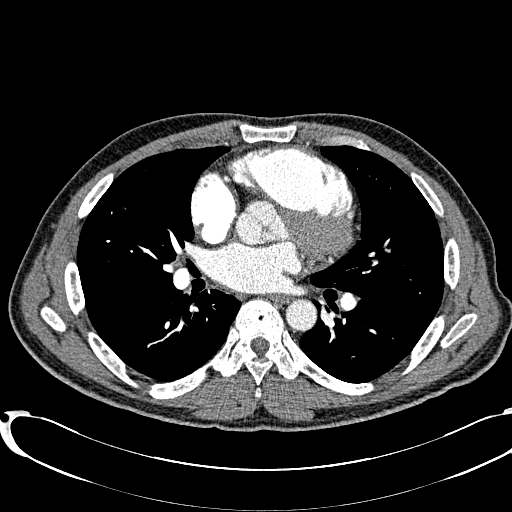
[im 143/285  lung]
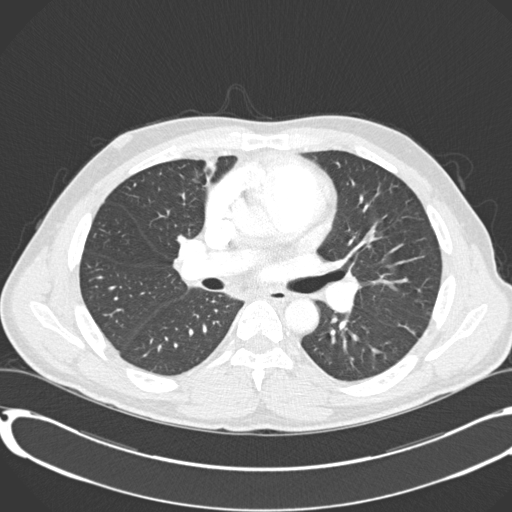
[im 157/285  mediastinal]
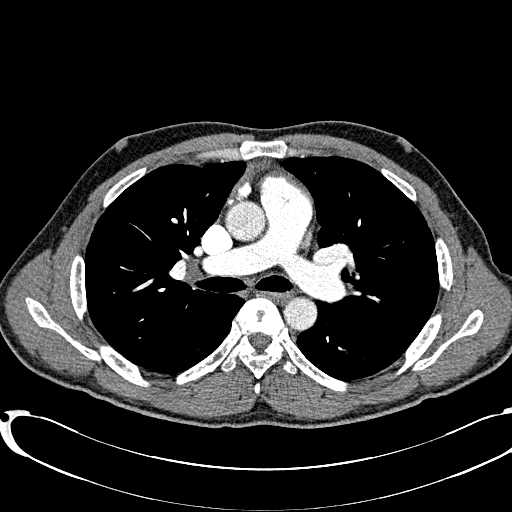
[im 171/285  lung]
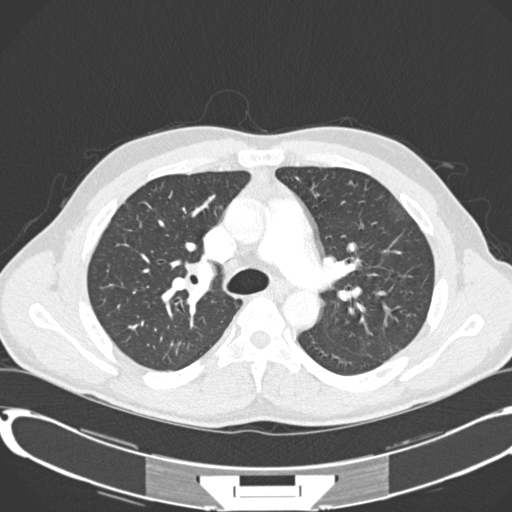
[im 185/285  mediastinal]
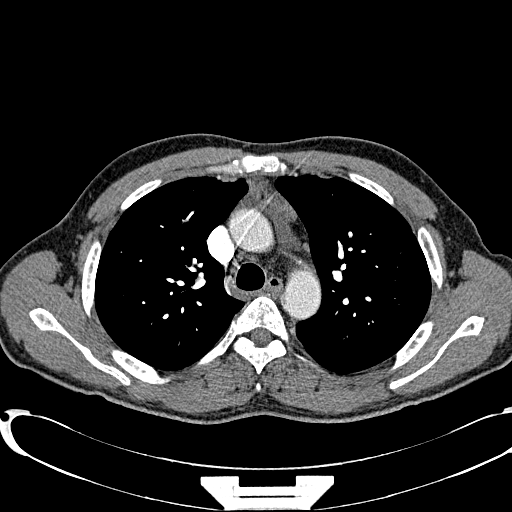
[im 199/285  lung]
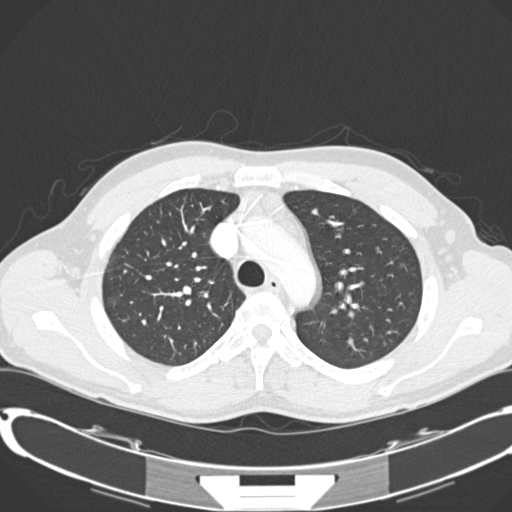
[im 228/285  mediastinal]
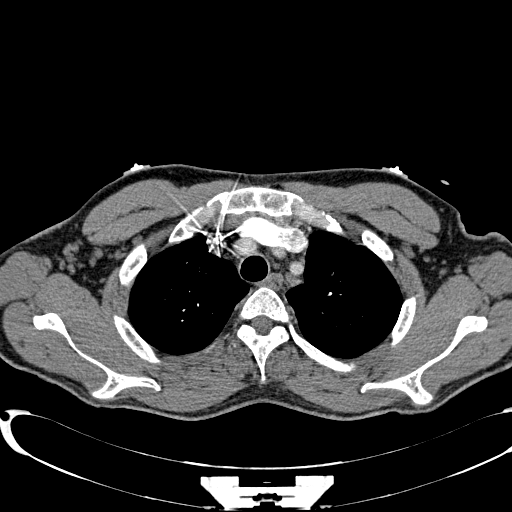
[im 242/285  lung]
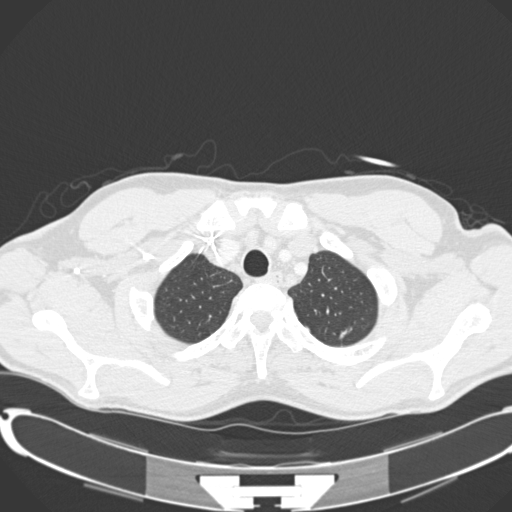
[im 256/285  mediastinal]
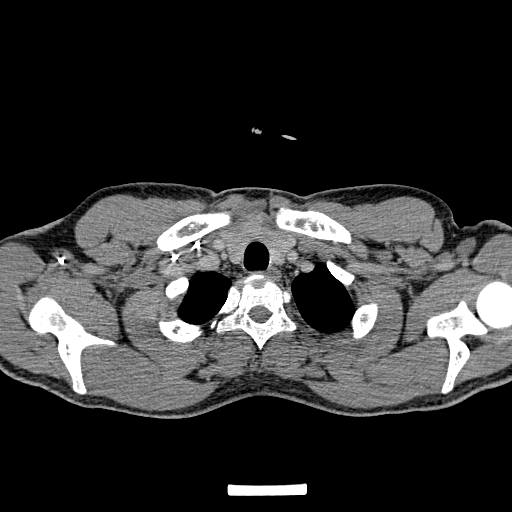
[im 270/285  lung]
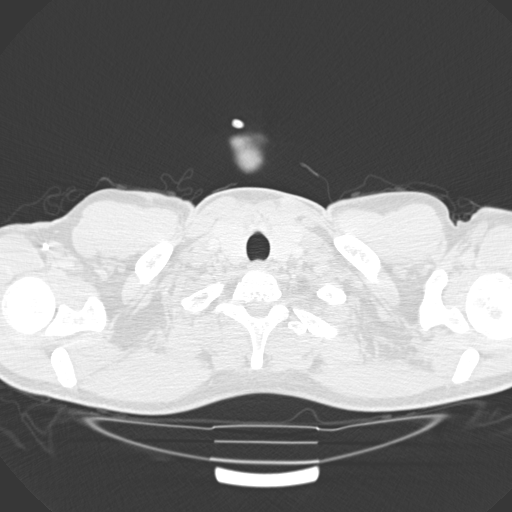

[Series 602: <mpr thick range> · coronal · 0.74mm/px · 1 of 123 slices shown]
[im 62/123  mediastinal]
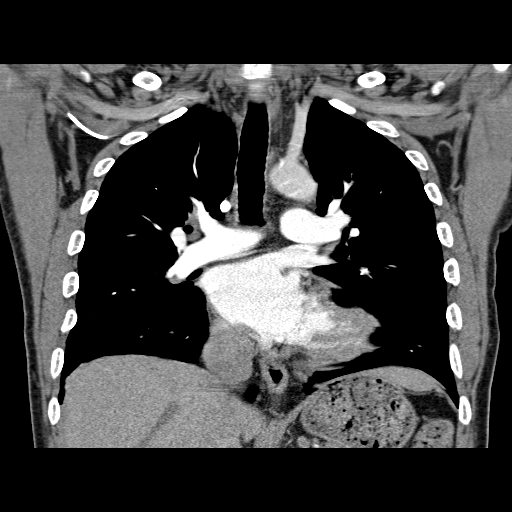

[18 of 36 positions shown; findings below may reference images not displayed]

Vascular findings:

There is adequate opacification of the pulmonary arteries with the
main pulmonary artery measuring 247 HU (image 47, series five).
There is no discrete intraluminal filling defect within the
pulmonary arteries to suggest acute pulmonary embolism, no
evaluation of the distal subsegmental pulmonary arteries is limited
secondary to patient motion artifact.  There is grossly unchanged
mild enlargement of the main pulmonary artery, measuring
approximately 3.1 cm in greatest transverse axial dimension (image
42).

Unchanged cardiomegaly.  No pericardial effusion.  Right upper
extremity approach PICC line tip terminates within the superior
cavoatrial junction.

Normal caliber of the thoracic aorta. Normal configuration of the
aortic arch.  Visualized portions of the arch vessels are patent.
No periaortic stranding.

Non vascular findings:

Evaluation of the bilateral lung base is obscured secondary to
patient motion artifact.  There is minimal bibasilar linear
heterogeneous opacities favored to represent subsegmental
atelectasis.  No focal airspace opacities.  No pleural effusion or
pneumothorax.  Central airways are patent.

Shoddy prevascular lymph nodes are grossly unchanged with index
prevascular node measuring approximate 6 mm in short axis diameter
(image 32), not enlarged by CT criteria.  Right infrahilar lymph
node is grossly unchanged measuring 9 mm in short axis diameter
(image 45) also not enlarged by CT criteria.  Shoddy grossly
symmetric bilateral axillary lymph nodes grossly unchanged favored
to be reactive in etiology.

Evaluation of the upper abdomen is limited to the arterial phase of
enhancement.  Redemonstrated dense calcifications in the diminutive
spleen compatible with splenic auto infarction.

No acute aggressive osseous abnormalities.  There is minimal
fishmouth deformity within several mid thoracic vertebral bodies,
grossly unchanged and likely the sequelae of provided history of
sickle cell disease.
IMPRESSION: 1.  No acute findings within the chest.  Specifically, no evidence
of pulmonary embolism to the level of the bilateral subsegmental
pulmonary arteries.  No focal airspace opacities to suggest
pneumonia.
2.  Unchanged cardiomegaly and borderline enlargement of the main
pulmonary artery, nonspecific but may be seen in the setting of
pulmonary arterial hypertension.  Further evaluation may be
obtained cardiac echo as clinically indicated.
3.  Stigmata of sickle cell disease including splenic auto
infarction and mild fish mouth deformity of several mid thoracic
vertebral bodies.

## 2014-03-18 IMAGING — CT CT HEAD W/O CM
1 series · 16 of 30 positions shown, 20 images · non-contrast
Comparison: 12/23/2009

CLINICAL DATA: Confusion and agitation

CT HEAD WITHOUT CONTRAST
TECHNIQUE: Contiguous axial images were obtained from the base of
the skull through the vertex without contrast.

[Series 2: headseq 4.8 h45s · axial · 0.43mm/px · z∈[-146,+6]mm · 16 of 36 slices shown, 20 images]
[im 2/36  brain]
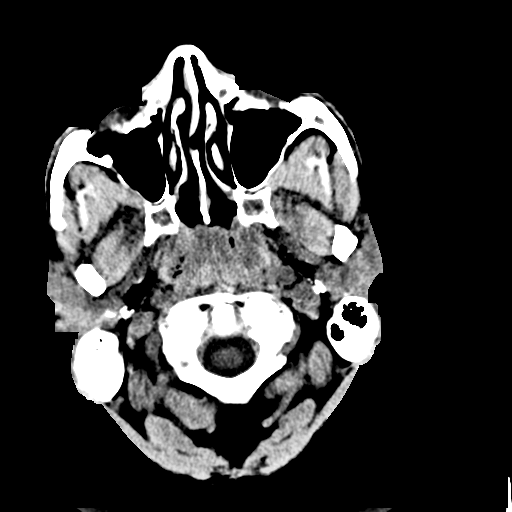
[im 2/36  bone]
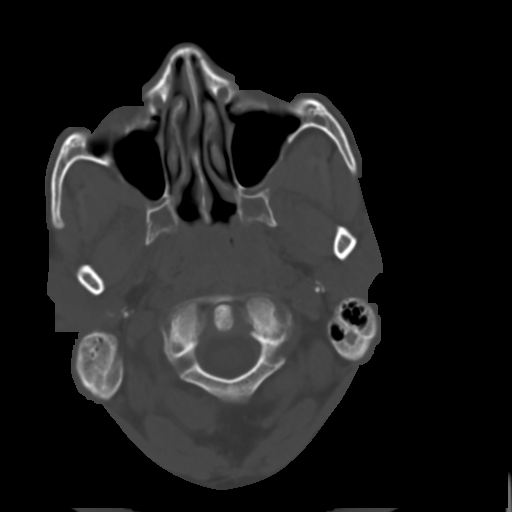
[im 4/36  brain]
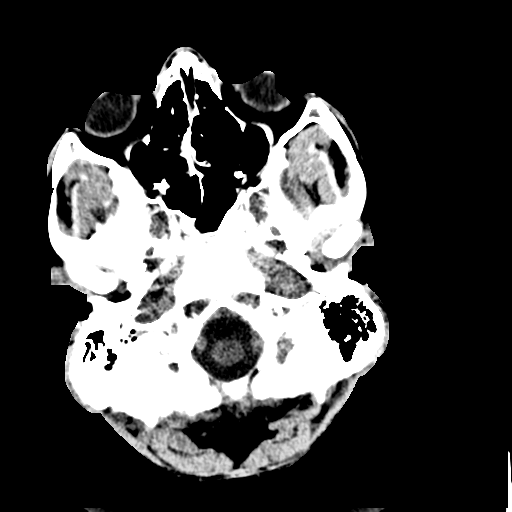
[im 7/36  brain]
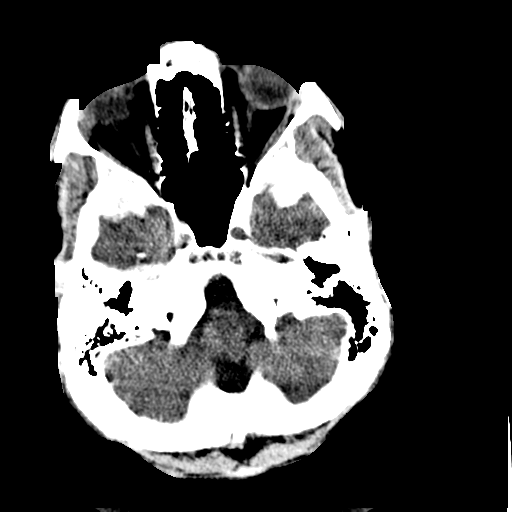
[im 9/36  brain]
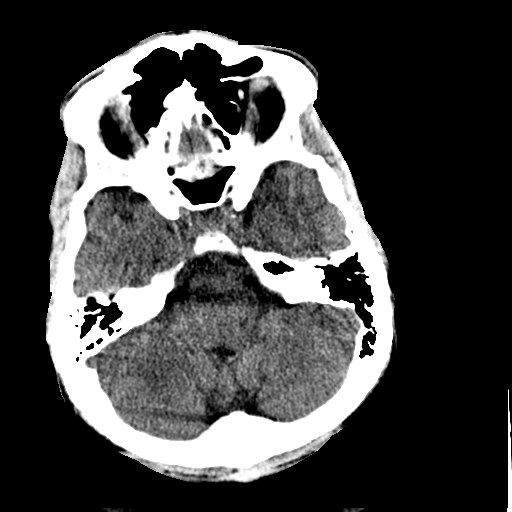
[im 10/36  brain]
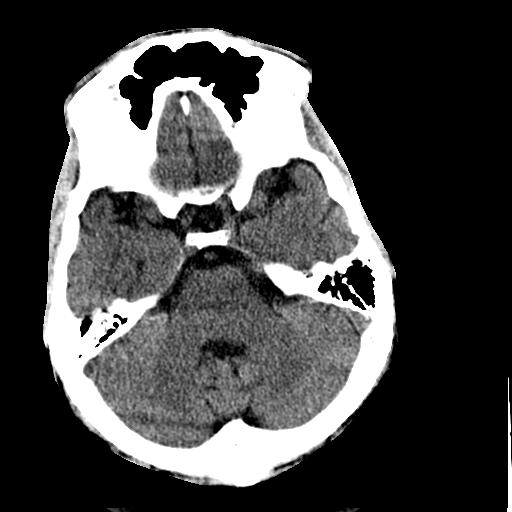
[im 10/36  bone]
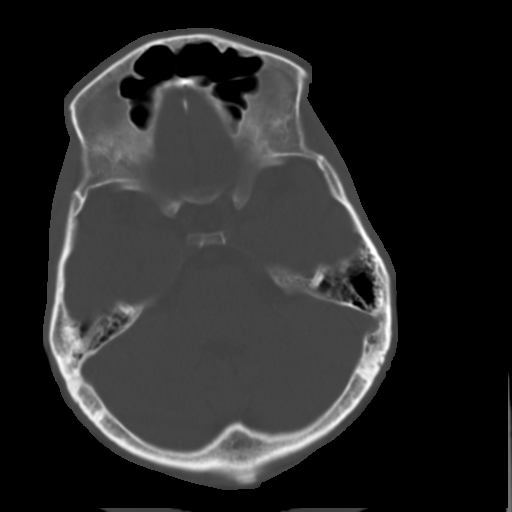
[im 13/36  brain]
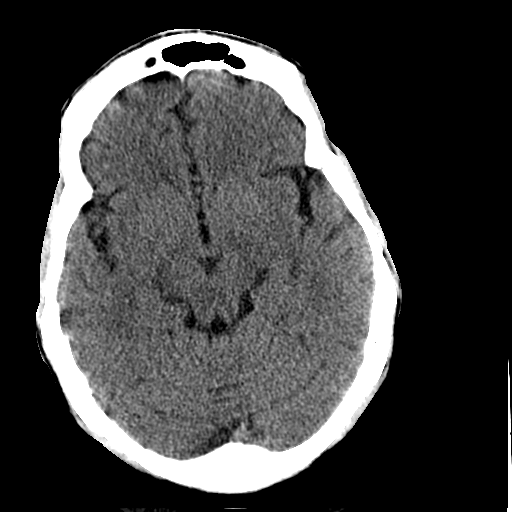
[im 15/36  brain]
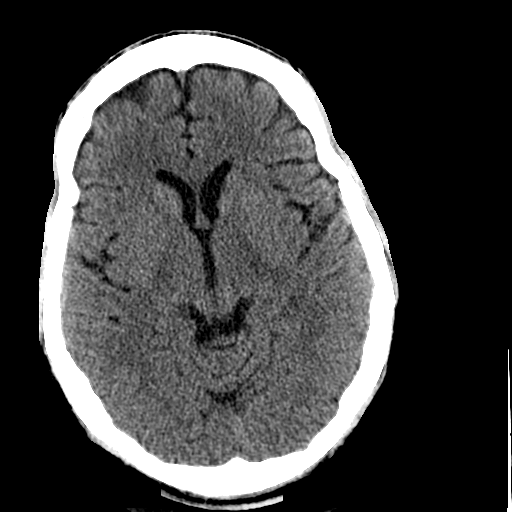
[im 17/36  brain]
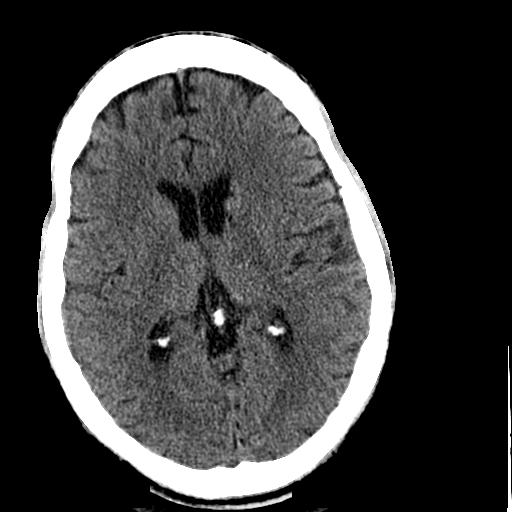
[im 19/36  brain]
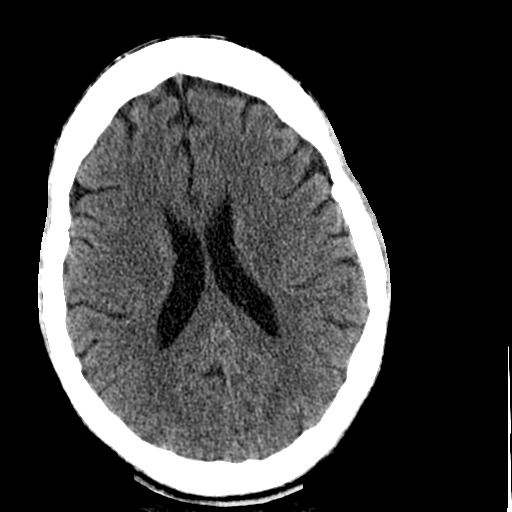
[im 19/36  bone]
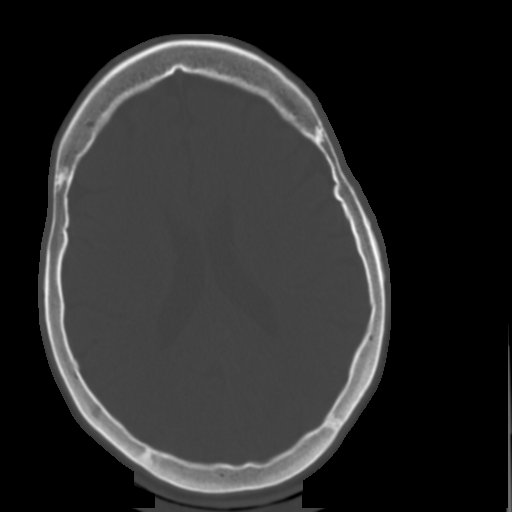
[im 21/36  brain]
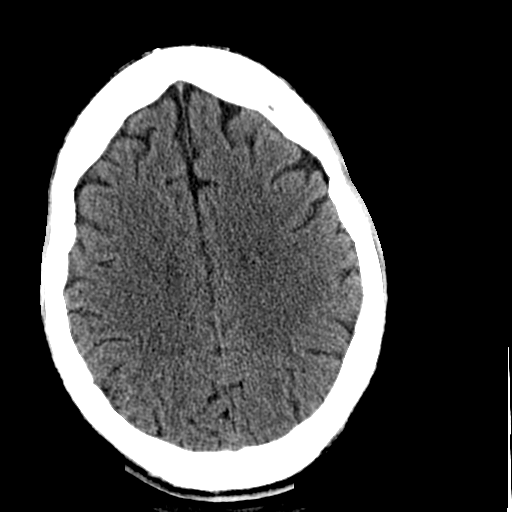
[im 23/36  brain]
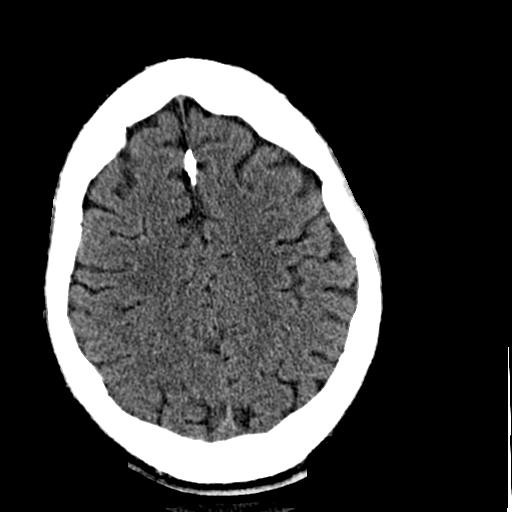
[im 26/36  brain]
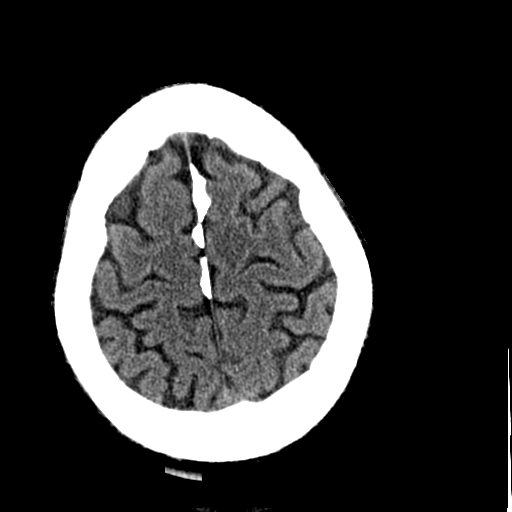
[im 27/36  brain]
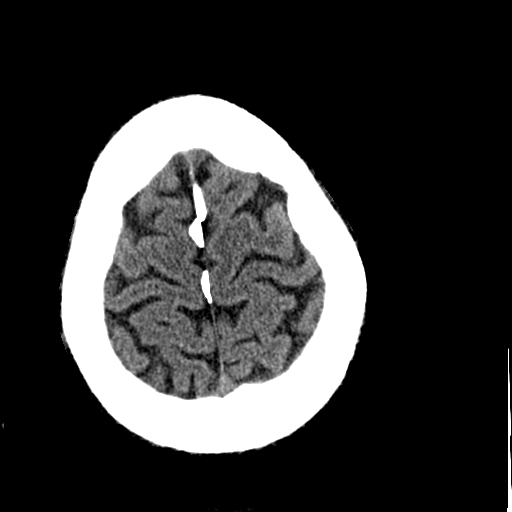
[im 27/36  bone]
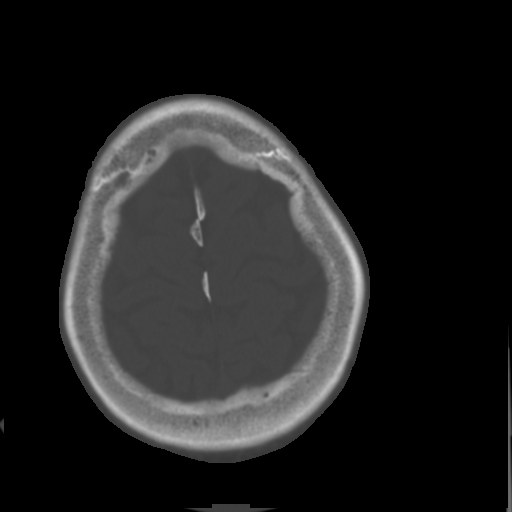
[im 29/36  brain]
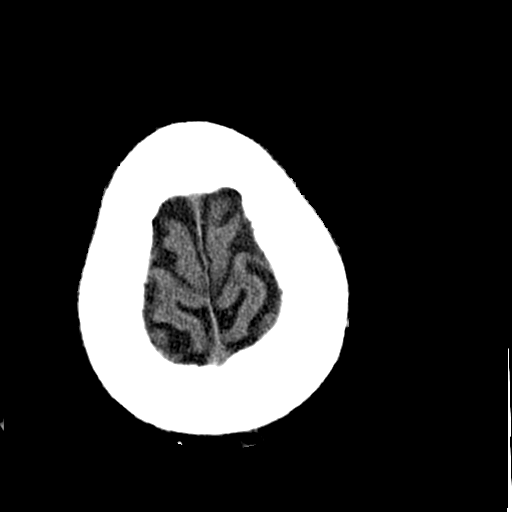
[im 32/36  brain]
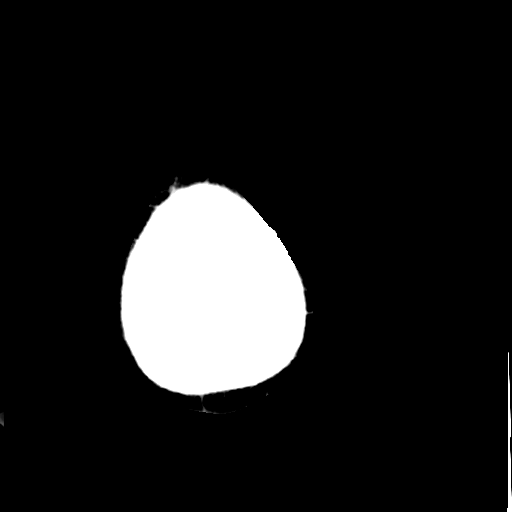
[im 34/36  brain]
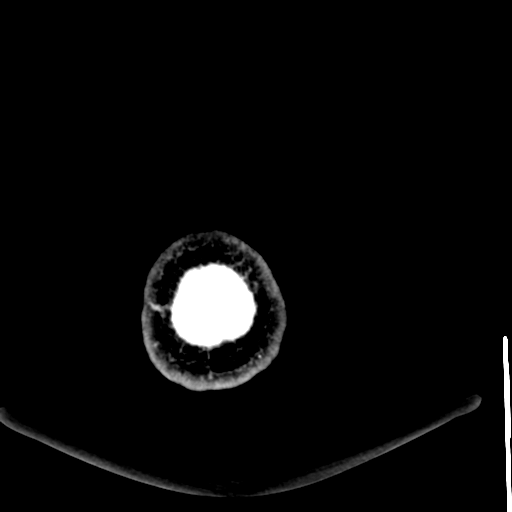

[16 of 30 positions shown; findings below may reference images not displayed]

FINDINGS: Ventricle size is normal.  Negative for intracranial
hemorrhage. Negative for acute infarct or mass.  Small hypodensity
left   caudate may represent chronic ischemia or benign cyst.
Calvarium is intact.
IMPRESSION: Negative for acute abnormality.

## 2014-03-18 MED ORDER — ACETAMINOPHEN 500 MG PO TABS
1000.0000 mg | ORAL_TABLET | Freq: Once | ORAL | Status: AC
  Administered 2014-03-18: 1000 mg via ORAL
  Filled 2014-03-18: qty 2

## 2014-03-18 MED ORDER — SODIUM CHLORIDE 0.9 % IV BOLUS (SEPSIS)
500.0000 mL | Freq: Once | INTRAVENOUS | Status: AC
  Administered 2014-03-18: 500 mL via INTRAVENOUS

## 2014-03-18 MED ORDER — ACETAMINOPHEN 325 MG PO TABS: 650.0000 mg | ORAL_TABLET | ORAL | Status: DC | PRN

## 2014-03-18 MED ORDER — SODIUM CHLORIDE 0.9 % IJ SOLN
10.0000 mL | INTRAMUSCULAR | Status: DC | PRN
Start: 2014-03-18 — End: 2014-03-24
  Administered 2014-03-18 – 2014-03-24 (×2): 10 mL
  Filled 2014-03-18: qty 40

## 2014-03-18 NOTE — Progress Notes (Signed)
SICKLE CELL SERVICE PROGRESS NOTE  [name]:[id number] DOB: 03-Jun-1980 DOA: 03/14/2014 PCP: [name], MD  Assessment/Plan: Principal Problem:   Pulmonary embolus Active Problems:   Vitamin D deficiency   Hypokalemia   Embolism, pulmonary with infarction   Hb-SS disease with crisis   Demand ischemia of myocardium   Sickle cell pain crisis   Hypoxia   PAH (pulmonary artery hypertension)  1. Acute Chest Syndrome: Pt presented with Acute Chest Syndrome likely related to recent acute PE. He was transfused a total of 4 Units of PRBC's  And currently his saturations are 97% on 3 L/min. Will check Saturations on RA and ambulatory sats if RA >92%.  2. Pulmonary HTN: Pt likely has a component of chronic thromboembolic PAH as well as PAH associated with Hb SS and hemolytic process. I have impressed upon the patient again about compliance with his Xarelto. I appreciate input from Pulmonology. I will make an out patient to Dr. Festus Aloe at [facility] who specializes in Pulmonary HTN in Sickle Cell Disease.  3. 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 ECHO shows moderately reduced RV function and mild dilatation. He is on Lisinopril 10 mg. Continue. Will consider increasing dose in the out patient setting. 4. 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 He is currently on day #4/15 of BID dosing.  5. Left sided weakness: Pt reports a recent history of left sided weakness, dizziness which still persists minimally and slurred and speech with some expressive dysphasia which has now almost resolved. This constellation of symptoms may represent a sub-acute CVA with now persistent dizziness. I will obtain a CT of his head. If negative for CVA will pursue EEG and neurology consult as this could less likely represent a seizure with Todd's  paralysis. He denies bowel/ bladder control but is unsure of loss of consciousness. 6. Acute on chronic anemia: Pt has evidence of acute hemolysis with elevated LDH. He has received 4 units PRBC's will monitor Hb. 7. Hb SS with crisis: This is the least of his problems during this hospitalization. He has used 45 mg with 68/65: Demands/Deliveries. I anticipate transition to oral analgesics tomorrow in prepartion for discharge.  8. Leukocytosis: Pt has a chronic leukocytosis the degree of which is concerning, however he has no evidence of infection. I have ordered a urinalysis to r/o UTI as a contributing factor.  9. Anemia of Chronic Disease: See above.  10. Chronic pain: Continue MS Contin. 11. Medication non-compliance: Pt has been non-adherent to his Xarelto resulting in repeated PE's. He also claims compliance with hydrea but MCV does not support compliance.  Code Status: Full Code Family Communication: N/A Disposition Plan: Not yet ready for discharge  [place].  Pager [phone]. If 7PM-7AM, please contact night-coverage.  03/18/2014, 12:03 PM  LOS: 4 days    Consultants:  Dr. Lamonte Sakai- Pulmonolgy  Procedures:  None  Antibiotics:  None  HPI/Subjective: Pt was recently discharged from the Hospital after being diagnosed with Acute PE. HE returned to the ED with C/O SOB, dizziness and wiorsened chest adn back pain. He also reports today the he had symptoms of LUE weakness, "slobbering" and "talking nonsense" which resolved in anout 2 days. He states that he forgot to tell the Physician on admission.He states that he has had similar symptoms in the last 2 weeks  but this was the longest duration of symptoms. Currently he does not feel any LUE weakness, or facial discomfort. His speech is fluid and at baseline. Pt reports pain at 6/10. Last BM last night.   Objective: Filed Vitals:   03/18/14 0444 03/18/14 0800 03/18/14 0935 03/18/14 1003  BP:    114/87  Pulse:    82  Temp:     98.7 F (37.1 C)  TempSrc:    Oral  Resp: 19 18 23 19   Height:      Weight:      SpO2: 97% 97% 97% 9%   Weight change:   Intake/Output Summary (Last 24 hours) at 03/18/14 1203 Last data filed at 03/18/14 0900  Gross per 24 hour  Intake     10 ml  Output   2475 ml  Net  -2465 ml    General: Alert, awake, oriented x3, in no acute distress.  HEENT: [place]/AT PEERL, EOMI, mild icterus. Neck: Trachea midline,  no masses, no thyromegal,y no JVD, no carotid bruit OROPHARYNX:  Moist, No exudate/ erythema/lesions.  Heart: Regular rate and rhythm, without murmurs, rubs, gallops, PMI non-displaced, no heaves or thrills on palpation.  Lungs: Clear to auscultation, no wheezing or rhonchi noted. No increased vocal fremitus resonant to percussion  Abdomen: Soft, nontender, nondistended, positive bowel sounds, no masses no hepatosplenomegaly noted.  Neuro: No focal neurological deficits noted cranial nerves II through XII grossly intact. DTRs 2+ bilaterally upper and lower extremities. Strength normal in bilateral upper and lower extremities. Musculoskeletal: No warm swelling or erythema around joints, no spinal tenderness noted. Psychiatric: Patient alert and oriented x3, good insight and cognition, good recent to remote recall. Lymph node survey: No cervical axillary or inguinal lymphadenopathy noted.   Data Reviewed: Basic Metabolic Panel:  Recent Labs Lab 03/14/14 1332 03/15/14 0830 03/16/14 0640 03/17/14 0809 03/18/14 0445  NA 135 136 137 131* 135  K 3.7 3.1* 3.9 3.8 4.6  CL 108 109 109 104 106  CO2 22 22 22 21 23   GLUCOSE 119* 98 116* 127* 119*  BUN 6 <5* 8 8 11   CREATININE 0.47* 0.53 0.50 0.43* 0.41*  CALCIUM 8.5 7.5* 8.3* 8.3* 8.7  MG  --  1.4*  --  1.5  --   PHOS  --   --   --  4.1  --    Liver Function Tests:  Recent Labs Lab 03/13/14 0649 03/14/14 1955 03/15/14 0830  AST 35 46* 33  ALT 20 20 17   ALKPHOS 76 81 64  BILITOT 5.6* 5.1* 4.9*  PROT 7.0 7.4 6.0   ALBUMIN 3.9 3.8 3.0*   No results for input(s): LIPASE, AMYLASE in the last 168 hours. No results for input(s): AMMONIA in the last 168 hours. CBC:  Recent Labs Lab 03/14/14 1332 03/15/14 0830 03/15/14 1344 03/16/14 0640 03/17/14 0635 03/17/14 0809 03/18/14 0445  WBC 14.6* 12.7*  --  16.3* 15.6*  --  21.3*  NEUTROABS  --  8.5*  --  12.7* 13.5*  --  17.7*  HGB 7.5* 6.8* 8.3* 8.4* 6.4* 9.2* 9.3*  HCT 21.3* 19.5* 24.0* 23.5* 18.7* 26.4* 27.5*  MCV 95.9 93.8  --  90.4 90.8  --  91.7  PLT 381 322  --  328 265  --  414*   Cardiac Enzymes:  Recent Labs Lab 03/12/14 1230 03/16/14 0640  TROPONINI 1.63* 2.19*   BNP (last 3 results) No results for input(s): BNP in the last 8760 hours.  ProBNP (last 3 results)  Recent Labs  01/01/14 0909  PROBNP 5790.0*    CBG: No results for input(s): GLUCAP in the last 168 hours.  Recent Results (from the past 240 hour(s))  MRSA PCR Screening     Status: Abnormal   Collection Time: 03/14/14  7:09 PM  Result Value Ref Range Status   MRSA by PCR POSITIVE (A) NEGATIVE Final    Comment:        The GeneXpert MRSA Assay (FDA approved for NASAL specimens only), is one component of a comprehensive MRSA colonization surveillance program. It is not intended to diagnose MRSA infection nor to guide or monitor treatment for MRSA infections. RESULT CALLED TO, READ BACK BY AND VERIFIED WITH: SPOKE WITH EARLY,S RN [phone] [place],N      Studies: Dg Chest 2 View  03/16/2014   CLINICAL DATA:  Shortness of breath and chest pain. Sickle cell patient.  EXAM: CHEST  2 VIEW  COMPARISON:  03/14/2014  FINDINGS: Left anterior chest wall Port-A-Cath is present with tip projecting over the superior cavoatrial junction. Stable cardiomegaly. No consolidative pulmonary opacities. No pleural effusion or pneumothorax.  IMPRESSION: No acute cardiopulmonary process.   Electronically Signed   By: Lovey Newcomer M.D.   On: 03/16/2014 15:14   Dg Chest 2  View  03/14/2014   CLINICAL DATA:  Dizzy and weak, history of sickle cell  EXAM: CHEST  2 VIEW  COMPARISON:  03/11/2014  FINDINGS: Cardiac shadow remains enlarged. A left chest wall port is again seen. The lungs are clear without focal infiltrate. The bony structures are within normal limits.  IMPRESSION: No acute abnormality is noted. The patient does have a recent diagnosis of acute on chronic pulmonary embolism on 03/11/2014   Electronically Signed   By: Inez Catalina M.D.   On: 03/14/2014 14:58   Dg Chest 2 View  03/11/2014   CLINICAL DATA:  Acute onset of shortness of breath. Decreased O2 saturation. Generalized weakness and pain. Initial encounter.  EXAM: CHEST  2 VIEW  COMPARISON:  Chest radiograph performed 03/06/2014  FINDINGS: The lungs are well-aerated. Vascular congestion is noted. Minimal bilateral atelectasis is seen. There is no evidence of pleural effusion or pneumothorax.  The heart is mildly enlarged. A left-sided chest port is noted ending about the cavoatrial junction. No acute osseous abnormalities are seen.  IMPRESSION: Vascular congestion and mild cardiomegaly. Minimal bilateral atelectasis seen.   Electronically Signed   By: Garald Balding M.D.   On: 03/11/2014 06:24   Dg Chest 2 View  03/06/2014   CLINICAL DATA:  Sickle cell crisis, shortness of breath, upper chest pain for 48 hr.  EXAM: CHEST  2 VIEW  COMPARISON:  Chest radiograph February 23, 2014  FINDINGS: The cardiac silhouette is mild-to-moderately enlarged. Pulmonary vascular congestion, increased without pleural effusion or focal consolidation. No pneumothorax.  LEFT chest Port-A-Cath with distal tip projecting cavoatrial junction. No pneumothorax. Soft tissue planes and included osseous structures are nonacute.  IMPRESSION: Mild cardiomegaly and pulmonary vascular congestion without focal consolidation.   Electronically Signed   By: Elon Alas   On: 03/06/2014 03:12   Dg Chest 2 View  02/23/2014   CLINICAL DATA:  Sickle  cell crisis, cough, congestion  EXAM: CHEST  2 VIEW  COMPARISON:  02/03/2014  FINDINGS: Borderline cardiomegaly again noted. Stable central mild vascular congestion. No definite superimposed infiltrate or pulmonary edema. Stable left Port-A-Cath position. Bony thorax is stable.  IMPRESSION: Borderline cardiomegaly. Central vascular congestion without convincing pulmonary edema. No segmental infiltrate.  Electronically Signed   By: Lahoma Crocker M.D.   On: 02/23/2014 09:36   Ct Angio Chest Pe W/cm &/or Wo Cm  03/11/2014   CLINICAL DATA:  Sickle cell disease with sudden onset left-sided chest pain.  EXAM: CT ANGIOGRAPHY CHEST WITH CONTRAST  TECHNIQUE: Multidetector CT imaging of the chest was performed using the standard protocol during bolus administration of intravenous contrast. Multiplanar CT image reconstructions and MIPs were obtained to evaluate the vascular anatomy.  CONTRAST:  147mL OMNIPAQUE IOHEXOL 350 MG/ML SOLN  COMPARISON:  01/01/2014  FINDINGS: THORACIC INLET/BODY WALL:  Left subclavian porta catheter, tip in the right atrium.  MEDIASTINUM:  There is multi focal pulmonary embolism with clot primarily subsegmental but intermittently occlusive. Clot in the posterior segment left upper lobe on image 92, and in the left lower lobe on image 175 and 183 is chronic and without progression from 01/01/2014. The lower lobe clot was previously obscured by artifact, but with the benefit of the current scan was present. There is acute pulmonary embolism to the right upper and right lower lobes, visualized on images 117, 164 and 177.  There is chronic cardiomegaly and right heart enlargement, which limits utility of the RV to LV ratio. The main pulmonary artery is chronically dilated at 37 mm. There is no progression of these findings to suggest acute right heart failure. No pericardial effusion. No aortic dissection. There is chronic mild enlargement of upper mediastinal lymph nodes.  LUNG WINDOWS:  Patchy  ground-glass density in the bilateral lungs, with lower lung subsegmental atelectasis or scarring that is stable from prior and likely fixed, chronic lung disease. No lung infarct noted.  UPPER ABDOMEN:  Splenic auto infarction.  OSSEOUS:  Typical osseous changes of sickle cell disease.  Critical Value/emergent results were called by telephone at the time of interpretation on 03/11/2014 at 2:00 pm to Dr. Charlynne Cousins , who verbally acknowledged these results.  Review of the MIP images confirms the above findings.  IMPRESSION: Acute on chronic pulmonary embolism with diffuse subsegmental clot. Chronic pulmonary artery hypertension; no morphologic change to suggest acute right heart failure.   Electronically Signed   By: Monte Fantasia M.D.   On: 03/11/2014 14:08   Nm Pulmonary Perf And Vent  03/14/2014   CLINICAL DATA:  Hypoxia.  Sickle cell disease  EXAM: NUCLEAR MEDICINE VENTILATION - PERFUSION LUNG SCAN  Views: Anterior, posterior, left lateral, right lateral, RPO, LPO, RAO, LAO -ventilation and perfusion  Radionuclide: Technetium 17m DTPA -ventilation; Technetium 27m macroaggregated albumin-perfusion  Dose:  35.3 mCi-ventilation; 5.3 mCi-perfusion  Route of administration: Inhalation-ventilation; intravenous-perfusion  COMPARISON:  Chest radiograph March 14, 2014; prior ventilation and perfusion lung scan Jun 19, 2013 ; chest CT angiogram March 11, 2014  FINDINGS: Ventilation: Radiotracer uptake is homogeneous and symmetric bilaterally. Cardiomegaly is noted. No focal ventilation defects are identified. The appearance is stable compared to prior lung scan.  Perfusion: This patient has known peripheral chronic pulmonary emboli based on CT appearance. On the perfusion study, there are peripheral small wedge-shaped defects, stable from prior lung scan consistent with chronic pulmonary emboli in the periphery as noted on recent CT as well. There is no segmental perfusion defect. The appearance is stable  compared to prior study.  IMPRESSION: Evidence of small peripheral chronic emboli bilaterally based on prior lung scan and recent chest CT angiogram appearance. No new pulmonary embolus is appreciable on this study. No segmental perfusion defects seen currently. No ventilation defects identified. Cardiomegaly is noted.  Electronically Signed   By: Lowella Grip III M.D.   On: 03/14/2014 18:41    Scheduled Meds: . sodium chloride   Intravenous Once  . sodium chloride   Intravenous Once  . Chlorhexidine Gluconate Cloth  6 each Topical Q0600  . cholecalciferol  2,000 Units Oral Daily  . diltiazem  120 mg Oral Daily  . folic acid  1 mg Oral q morning - 10a  . HYDROmorphone PCA 2 mg/mL   Intravenous 6 times per day  . hydroxyurea  1,500 mg Oral Q breakfast  . ketorolac  30 mg Intravenous 4 times per day  . lisinopril  10 mg Oral Daily  . metoprolol tartrate  25 mg Oral Daily  . morphine  30 mg Oral Q12H  . mupirocin ointment   Nasal BID  . potassium chloride SA  20 mEq Oral q morning - 10a  . rivaroxaban  15 mg Oral BID WC   Continuous Infusions: . sodium chloride 50 mL/hr at 03/18/14 7414    Principal Problem:   Pulmonary embolus Active Problems:   Vitamin D deficiency   Hypokalemia   Embolism, pulmonary with infarction   Hb-SS disease with crisis   Demand ischemia of myocardium   Sickle cell pain crisis   Hypoxia   PAH (pulmonary artery hypertension)  Time spent 65 minutes.

## 2014-03-18 NOTE — Progress Notes (Signed)
I spoke with Dr. Felisa Bonier regarding [name] and will refer. Office reports earliest appointment in May 2016. Will put on waiting list for cancellations.

## 2014-03-18 NOTE — Progress Notes (Signed)
CARE MANAGEMENT NOTE 03/18/2014  Patient:  [name], [name]   Account Number:  [id number]  Date Initiated:  03/17/2014  Documentation initiated by:  Edwyna Shell  Subjective/Objective Assessment:   34 yo male admitted with SCC and acute on chronic PE from home     Action/Plan:   discharge planning   Anticipated DC Date:  03/20/2014   Anticipated DC Plan:  [place]  CM consult      Choice offered to / List presented to:             Status of service:   Medicare Important Message given?  YES (If response is "NO", the following Medicare IM given date fields will be blank) Date Medicare IM given:  03/17/2014 Medicare IM given by:  Edwyna Shell Date Additional Medicare IM given:   Additional Medicare IM given by:    Discharge Disposition:  HOME/SELF CARE  Per UR Regulation:    If discussed at Long Length of Stay Meetings, dates discussed:    Comments:  03/18/14 Edwyna Shell RN BSN CM [phone] Patient stated that his PCP is Dr.[name], has a pharmacy, and does not have transportation concerns. Patient stated that he does not have any DME. If home O2 is needed at time of discharge please document qualifying O2 sats. Thanks

## 2014-03-19 ENCOUNTER — Encounter (HOSPITAL_COMMUNITY): Payer: Self-pay | Admitting: Radiology

## 2014-03-19 ENCOUNTER — Ambulatory Visit (HOSPITAL_COMMUNITY): Payer: Medicare Other

## 2014-03-19 DIAGNOSIS — I313 Pericardial effusion (noninflammatory): Secondary | ICD-10-CM | POA: Diagnosis not present

## 2014-03-19 DIAGNOSIS — R42 Dizziness and giddiness: Secondary | ICD-10-CM | POA: Diagnosis not present

## 2014-03-19 DIAGNOSIS — R590 Localized enlarged lymph nodes: Secondary | ICD-10-CM | POA: Diagnosis not present

## 2014-03-19 DIAGNOSIS — I5022 Chronic systolic (congestive) heart failure: Secondary | ICD-10-CM | POA: Diagnosis not present

## 2014-03-19 DIAGNOSIS — I2699 Other pulmonary embolism without acute cor pulmonale: Secondary | ICD-10-CM | POA: Diagnosis not present

## 2014-03-19 DIAGNOSIS — I272 Other secondary pulmonary hypertension: Secondary | ICD-10-CM | POA: Diagnosis not present

## 2014-03-19 DIAGNOSIS — R4702 Dysphasia: Secondary | ICD-10-CM | POA: Diagnosis not present

## 2014-03-19 DIAGNOSIS — R4781 Slurred speech: Secondary | ICD-10-CM | POA: Diagnosis not present

## 2014-03-19 DIAGNOSIS — R0602 Shortness of breath: Secondary | ICD-10-CM | POA: Diagnosis not present

## 2014-03-19 DIAGNOSIS — R509 Fever, unspecified: Secondary | ICD-10-CM

## 2014-03-19 DIAGNOSIS — D5700 Hb-SS disease with crisis, unspecified: Secondary | ICD-10-CM | POA: Diagnosis not present

## 2014-03-19 DIAGNOSIS — J9621 Acute and chronic respiratory failure with hypoxia: Secondary | ICD-10-CM | POA: Diagnosis not present

## 2014-03-19 DIAGNOSIS — E559 Vitamin D deficiency, unspecified: Secondary | ICD-10-CM | POA: Diagnosis not present

## 2014-03-19 DIAGNOSIS — J90 Pleural effusion, not elsewhere classified: Secondary | ICD-10-CM | POA: Diagnosis not present

## 2014-03-19 DIAGNOSIS — R0902 Hypoxemia: Secondary | ICD-10-CM | POA: Diagnosis not present

## 2014-03-19 DIAGNOSIS — I517 Cardiomegaly: Secondary | ICD-10-CM | POA: Diagnosis not present

## 2014-03-19 LAB — CBC WITH DIFFERENTIAL/PLATELET
Basophils Absolute: 0 10*3/uL (ref 0.0–0.1)
Basophils Relative: 0 % (ref 0–1)
Eosinophils Absolute: 0.4 10*3/uL (ref 0.0–0.7)
Eosinophils Relative: 2 % (ref 0–5)
HCT: 26.8 % — ABNORMAL LOW (ref 39.0–52.0)
Hemoglobin: 9.2 g/dL — ABNORMAL LOW (ref 13.0–17.0)
Lymphocytes Relative: 6 % — ABNORMAL LOW (ref 12–46)
Lymphs Abs: 1.3 10*3/uL (ref 0.7–4.0)
MCH: 31.4 pg (ref 26.0–34.0)
MCHC: 34.3 g/dL (ref 30.0–36.0)
MCV: 91.5 fL (ref 78.0–100.0)
MONO ABS: 2.2 10*3/uL — AB (ref 0.1–1.0)
Monocytes Relative: 10 % (ref 3–12)
Neutro Abs: 18.3 10*3/uL — ABNORMAL HIGH (ref 1.7–7.7)
Neutrophils Relative %: 82 % — ABNORMAL HIGH (ref 43–77)
Platelets: 357 10*3/uL (ref 150–400)
RBC: 2.93 MIL/uL — ABNORMAL LOW (ref 4.22–5.81)
RDW: 22.6 % — AB (ref 11.5–15.5)
WBC: 22.2 10*3/uL — ABNORMAL HIGH (ref 4.0–10.5)

## 2014-03-19 LAB — BLOOD GAS, ARTERIAL
Acid-base deficit: 1.5 mmol/L (ref 0.0–2.0)
Bicarbonate: 21.8 mEq/L (ref 20.0–24.0)
DRAWN BY: 331471
O2 CONTENT: 1 L/min
O2 SAT: 87.5 %
PH ART: 7.435 (ref 7.350–7.450)
PO2 ART: 59.7 mmHg — AB (ref 80.0–100.0)
Patient temperature: 98.6
TCO2: 20.5 mmol/L (ref 0–100)
pCO2 arterial: 33.1 mmHg — ABNORMAL LOW (ref 35.0–45.0)

## 2014-03-19 LAB — TYPE AND SCREEN
ABO/RH(D): O POS
Antibody Screen: NEGATIVE

## 2014-03-19 LAB — BASIC METABOLIC PANEL
ANION GAP: 5 (ref 5–15)
BUN: 11 mg/dL (ref 6–23)
CALCIUM: 8.4 mg/dL (ref 8.4–10.5)
CO2: 20 mmol/L (ref 19–32)
Chloride: 109 mmol/L (ref 96–112)
Creatinine, Ser: 0.44 mg/dL — ABNORMAL LOW (ref 0.50–1.35)
GFR calc Af Amer: >90 mL/min (ref >90)
Glucose, Bld: 107 mg/dL — ABNORMAL HIGH (ref 70–99)
POTASSIUM: 4.3 mmol/L (ref 3.5–5.1)
SODIUM: 134 mmol/L — AB (ref 135–145)

## 2014-03-19 MED ORDER — VANCOMYCIN HCL IN DEXTROSE 1-5 GM/200ML-% IV SOLN
1000.0000 mg | Freq: Three times a day (TID) | INTRAVENOUS | Status: DC
  Administered 2014-03-19 – 2014-03-21 (×7): 1000 mg via INTRAVENOUS
  Filled 2014-03-19 (×10): qty 200

## 2014-03-19 MED ORDER — IOHEXOL 300 MG/ML  SOLN
80.0000 mL | Freq: Once | INTRAMUSCULAR | Status: AC | PRN
Start: 2014-03-19 — End: 2014-03-19
  Administered 2014-03-19: 80 mL via INTRAVENOUS

## 2014-03-19 MED ORDER — SODIUM CHLORIDE 0.9 % IV SOLN: Freq: Once | INTRAVENOUS | Status: DC

## 2014-03-19 MED ORDER — DEXTROSE 5 % IV SOLN
1.0000 g | Freq: Three times a day (TID) | INTRAVENOUS | Status: DC
  Administered 2014-03-19 – 2014-03-24 (×16): 1 g via INTRAVENOUS
  Filled 2014-03-19 (×17): qty 1

## 2014-03-19 NOTE — Progress Notes (Signed)
ANTIBIOTIC CONSULT NOTE - INITIAL  Pharmacy Consult for Vanc/Cefepime Indication: rule out pneumonia  No Known Allergies  Patient Measurements: Height: 6' (182.9 cm) Weight: 150 lb 12.7 oz (68.4 kg) IBW/kg (Calculated) : 77.6 Adjusted Body Weight: n/a  Vital Signs: Temp: 99 F (37.2 C) (02/17 1523) Temp Source: Oral (02/17 1523) BP: 124/79 mmHg (02/17 1523) Pulse Rate: 84 (02/17 1523) Intake/Output from previous day: 02/16 0701 - 02/17 0700 In: -  Out: 3250 [Urine:3250] Intake/Output from this shift: Total I/O In: 880 [P.O.:480; I.V.:400] Out: 1400 [Urine:1400]  Labs:  Recent Labs  03/17/14 0635 03/17/14 0809 03/18/14 0445 03/19/14 0227  WBC 15.6*  --  21.3* 22.2*  HGB 6.4* 9.2* 9.3* 9.2*  PLT 265  --  414* 357  CREATININE  --  0.43* 0.41* 0.44*   Estimated Creatinine Clearance: 125.9 mL/min (by C-G formula based on Cr of 0.44). No results for input(s): VANCOTROUGH, VANCOPEAK, VANCORANDOM, GENTTROUGH, GENTPEAK, GENTRANDOM, TOBRATROUGH, TOBRAPEAK, TOBRARND, AMIKACINPEAK, AMIKACINTROU, AMIKACIN in the last 72 hours.   Microbiology: Recent Results (from the past 720 hour(s))  MRSA PCR Screening     Status: Abnormal   Collection Time: 03/06/14 12:28 PM  Result Value Ref Range Status   MRSA by PCR POSITIVE (A) NEGATIVE Final    Comment:        The GeneXpert MRSA Assay (FDA approved for NASAL specimens only), is one component of a comprehensive MRSA colonization surveillance program. It is not intended to diagnose MRSA infection nor to guide or monitor treatment for MRSA infections. RESULT CALLED TO, READ BACK BY AND VERIFIED WITH: GAGLIANO,S @1421  ON [id number] BY POTEAT,S   MRSA PCR Screening     Status: Abnormal   Collection Time: 03/14/14  7:09 PM  Result Value Ref Range Status   MRSA by PCR POSITIVE (A) NEGATIVE Final    Comment:        The GeneXpert MRSA Assay (FDA approved for NASAL specimens only), is one component of a comprehensive MRSA  colonization surveillance program. It is not intended to diagnose MRSA infection nor to guide or monitor treatment for MRSA infections. RESULT CALLED TO, READ BACK BY AND VERIFIED WITH: SPOKE WITH EARLY,S RN [phone] COVINGTON,N     Medical History: Past Medical History  Diagnosis Date  . Sickle cell anemia   . Blood transfusion   . Acute embolism and thrombosis of right internal jugular vein   . Hypokalemia   . Mood disorder   . History of pulmonary embolus (PE)   . Avascular necrosis   . Leukocytosis     Chronic  . Thrombocytosis     Chronic  . Hypertension   . History of Clostridium difficile infection   . Uses marijuana   . Chronic anticoagulation   . Functional asplenia   . Former smoker   . Second hand tobacco smoke exposure   . Alcohol consumption of one to four drinks per day   . Noncompliance with medication regimen   . Sickle-cell crisis with associated acute chest syndrome 05/13/2013  . Acute chest syndrome 06/18/2013  . Demand ischemia 01/02/2014    Medications:  Scheduled:  . sodium chloride   Intravenous Once  . sodium chloride   Intravenous Once  . ceFEPime (MAXIPIME) IV  1 g Intravenous 3 times per day  . Chlorhexidine Gluconate Cloth  6 each Topical Q0600  . cholecalciferol  2,000 Units Oral Daily  . diltiazem  120 mg Oral Daily  . folic acid  1 mg Oral q  morning - 10a  . HYDROmorphone PCA 2 mg/mL   Intravenous 6 times per day  . hydroxyurea  1,500 mg Oral Q breakfast  . ketorolac  30 mg Intravenous 4 times per day  . lisinopril  10 mg Oral Daily  . metoprolol tartrate  25 mg Oral Daily  . morphine  30 mg Oral Q12H  . mupirocin ointment   Nasal BID  . potassium chloride SA  20 mEq Oral q morning - 10a  . rivaroxaban  15 mg Oral BID WC  . vancomycin  1,000 mg Intravenous 3 times per day   Infusions:  . sodium chloride 50 mL/hr at 03/18/14 6808   Anti-infectives    Start     Dose/Rate Route Frequency Ordered Stop   03/19/14 1700   vancomycin (VANCOCIN) IVPB 1000 mg/200 mL premix     1,000 mg 200 mL/hr over 60 Minutes Intravenous 3 times per day 03/19/14 1605     03/19/14 1700  ceFEPIme (MAXIPIME) 1 g in dextrose 5 % 50 mL IVPB     1 g 100 mL/hr over 30 Minutes Intravenous 3 times per day 03/19/14 1605       Assessment 33yoM with sickle-cell disease recently treated at [facility] for acute PE and discharged 2/11; returned 2/12 with acute chest syndrome related to PE.  New CT 2/17 chest cannot rule out infection as contributing to continued WOB; pharmacy consulted to start Vanc/Cefepime to r/o pulmonary infectious process.  2/17 >> vancomycin >> 2/17 >> cefepime >>  Temp: 101.8 overnight, afebrile now WBC: 22.2 (concomitant PE, acute chest syndrome) Renal function intact; CrCl > 100 CG/Norm SpO2: > 90% on 1L [place]  2/17 BCx: IP x 2 2/12 MRSA screen: positive   Goal of Therapy:   Vancomycin trough level 15-20 mcg/ml   Eradication of infection  Appropriate antibiotic dosing for indication and renal function  Plan:   Begin vancomycin 1g IV q8h  Begin cefepime 1g IV q8h  Measure vancomycin trough levels at steady state as indicated  Follow clinical course, culture results as available, renal function  Follow for de-escalation of antibiotics and LOT   Reuel Boom, PharmD Pager: [phone] 03/19/2014, 4:07 PM

## 2014-03-19 NOTE — Progress Notes (Signed)
Came back to check on [name]. He is alert and oriented x 3 but has increased work of breathing now at rest. He demonstrates abdominal retractions at rest and respiratory rate mildly increased to 120-22. Although his saturations on pulse-oximeter reads 96% on 1 l/min, a review of his ABG on 1 l/min of Oxygen shows PaO2 of 59.7 which is concerning. This patient is at hgh risk of decompensating and I feel that he requires closer and more intense observation than can be provided on a general med-surg unit. I will transfer him to SDU. I have spoken with Dr. Lamonte Sakai who is monitoring the Unit by Video monitor tonight. He is also familiar with [name] and I have discussed Pt's current clinical condition with him.

## 2014-03-19 NOTE — Progress Notes (Signed)
PCA Documentation for 24 hours:  Total Received : 29.4 mg Total demand: 44 Total delivered: 42

## 2014-03-19 NOTE — Progress Notes (Addendum)
SICKLE CELL SERVICE PROGRESS NOTE  [name] GHW:[id number] DOB: Sep 14, 1980 DOA: 03/14/2014 PCP: Armelia Penton A., MD  Assessment/Plan: Principal Problem:   Pulmonary embolus Active Problems:   Vitamin D deficiency   Hypokalemia   Embolism, pulmonary with infarction   Hb-SS disease with crisis   Demand ischemia of myocardium   Sickle cell pain crisis   Hypoxia   PAH (pulmonary artery hypertension)  1. Acute Chest Syndrome: Pt presented with Acute Chest Syndrome likely related to recent acute PE. Today he is having increased work of breathing and had a fever of 101.8 last night.  He was transfused a total of 4 Units of PRBC's and his Hb levels remain stable.Currently his  Resting saturations are 95% on 1 L/min. Will check Saturations on RA and ambulatory sats if RA >92%. I will obtain a ct chest to evaluate for an acute pulmonary parenchymal process. 2. Fever: Pt has no overt signs of infection however his increased work of breathing suggests that he may have an acute pulmonary process. Will obtain CT chest. BC's were drawn last night. Will observe off of antibiotics for now. If any signs of deterioration will start emperic antibiotics for HCAP. 3. Pulmonary HTN: Pt likely has a component of chronic thromboembolic PAH as well as PAH associated with Hb SS and hemolytic process. I have impressed upon the patient again about compliance with his Xarelto. I appreciate input from Pulmonology. I will make an out patient to Dr. Festus Aloe at [facility] who specializes in Pulmonary HTN in Sickle Cell Disease.  4. 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 ECHO shows moderately reduced RV function and mild dilatation. He is on Lisinopril 10 mg. Continue. Will consider increasing dose in the out patient setting. 5. 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 He is currently on day #4/15 of BID dosing.  6. Left sided weakness: Pt reports a recent history of left sided weakness, dizziness which still persists minimally and slurred and speech with some expressive dysphasia which has now almost resolved. This constellation of symptoms may represent a sub-acute CVA with now persistent dizziness. I have reviewed the CT of his head and note that it is negative for a recent acute CVA, however it does show lacunar infarcts I will pursue EEG and neurology consult as this could less likely represent a seizure with Todd's paralysis. He denies bowel/ bladder control but is unsure of loss of consciousness. 7. Acute on chronic anemia: Pt has evidence of acute hemolysis with elevated LDH. He has received 4 units PRBC's will monitor Hb. 8. Hb SS with crisis: This is the least of his problems during this hospitalization. He has used 29.4 mg with 44/42: Demands/Deliveries. I anticipate transition to oral analgesics tomorrow in prepartion for discharge.  9. Leukocytosis: Pt has a chronic leukocytosis the degree of which is concerning, however he has no evidence of infection. Urinalysis negative 10. Anemia of Chronic Disease: See above.  11. Chronic pain: Continue MS Contin. 12. Medication non-compliance: Pt has been non-adherent to his Xarelto resulting in repeated PE's. He also claims compliance with hydrea but MCV does not support compliance.  Code Status: Full Code Family Communication: N/A Disposition Plan: Not yet ready for discharge  [place].  Pager [phone]. If 7PM-7AM, please contact night-coverage.  03/19/2014, 9:25 AM  LOS: 5 days  Consultants:  Dr. Lamonte Sakai- Pulmonolgy  Procedures:  None  Antibiotics:  None  HPI/Subjective: Pt was recently discharged from the Hospital after being diagnosed with Acute PE. HE returned to the ED with C/O SOB, dizziness and wiorsened chest adn back pain. He also reports today the he had  symptoms of LUE weakness, "slobbering" and "talking nonsense" which resolved in anout 2 days. He states that he forgot to tell the Physician on admission.He states that he has had similar symptoms in the last 2 weeks but this was the longest duration of symptoms. Currently he does not feel any LUE weakness, or facial discomfort. His speech is fluid and at baseline. Pt reports pain at 6/10. Last BM last night. He c/o of SOB with minimal activity. He is having pain mostly localized to the RUQ and RLL. He demonstrates increased work of breathing.   Objective: Filed Vitals:   03/19/14 0131 03/19/14 0415 03/19/14 0604 03/19/14 0800  BP: 114/75  121/84   Pulse: 76  75   Temp: 99 F (37.2 C)  98.8 F (37.1 C)   TempSrc: Oral  Oral   Resp: 22 15 18 16   Height:      Weight:      SpO2: 94% 94% 95% 96%   Weight change:   Intake/Output Summary (Last 24 hours) at 03/19/14 0925 Last data filed at 03/19/14 6761  Gross per 24 hour  Intake      0 ml  Output   2850 ml  Net  -2850 ml    General: Alert, awake, oriented x3,  Demonstrating increased work of breathing. HEENT: [place]/AT PEERL, EOMI, mild icterus. Neck: Trachea midline,  no masses, no thyromegal,y no JVD, no carotid bruit OROPHARYNX:  Moist, No exudate/ erythema/lesions.  Heart: Regular rate and rhythm, without murmurs, rubs, gallops, PMI non-displaced, no heaves or thrills on palpation.  Lungs: Clear to auscultation, no wheezing or rhonchi noted. No increased vocal fremitus resonant to percussion. Tenderness in anterior aspect of RLL. Abdomen: Soft, nontender, nondistended, positive bowel sounds, no masses no hepatosplenomegaly noted.  Neuro: No focal neurological deficits noted cranial nerves II through XII grossly intact. Strength normal in bilateral upper and lower extremities. Musculoskeletal: No warm swelling or erythema around joints, no spinal tenderness noted. Psychiatric: Patient alert and oriented x3, good insight and cognition,  good recent to remote recall. Lymph node survey: No cervical axillary or inguinal lymphadenopathy noted.   Data Reviewed: Basic Metabolic Panel:  Recent Labs Lab 03/15/14 0830 03/16/14 0640 03/17/14 0809 03/18/14 0445 03/19/14 0227  NA 136 137 131* 135 134*  K 3.1* 3.9 3.8 4.6 4.3  CL 109 109 104 106 109  CO2 22 22 21 23 20   GLUCOSE 98 116* 127* 119* 107*  BUN <5* 8 8 11 11   CREATININE 0.53 0.50 0.43* 0.41* 0.44*  CALCIUM 7.5* 8.3* 8.3* 8.7 8.4  MG 1.4*  --  1.5  --   --   PHOS  --   --  4.1  --   --    Liver Function Tests:  Recent Labs Lab 03/13/14 0649 03/14/14 1955 03/15/14 0830  AST 35 46* 33  ALT 20 20 17   ALKPHOS 76 81 64  BILITOT 5.6* 5.1* 4.9*  PROT 7.0 7.4 6.0  ALBUMIN 3.9 3.8 3.0*   No results for input(s): LIPASE, AMYLASE in the last 168 hours. No results for input(s): AMMONIA in the last 168 hours. CBC:  Recent Labs Lab 03/15/14 0830  03/16/14 9509 03/17/14 3267 03/17/14 0809  03/18/14 0445 03/19/14 0227  WBC 12.7*  --  16.3* 15.6*  --  21.3* 22.2*  NEUTROABS 8.5*  --  12.7* 13.5*  --  17.7* 18.3*  HGB 6.8*  < > 8.4* 6.4* 9.2* 9.3* 9.2*  HCT 19.5*  < > 23.5* 18.7* 26.4* 27.5* 26.8*  MCV 93.8  --  90.4 90.8  --  91.7 91.5  PLT 322  --  328 265  --  414* 357  < > = values in this interval not displayed. Cardiac Enzymes:  Recent Labs Lab 03/12/14 1230 03/16/14 0640  TROPONINI 1.63* 2.19*   BNP (last 3 results) No results for input(s): BNP in the last 8760 hours.  ProBNP (last 3 results)  Recent Labs  01/01/14 0909  PROBNP 5790.0*    CBG: No results for input(s): GLUCAP in the last 168 hours.  Recent Results (from the past 240 hour(s))  MRSA PCR Screening     Status: Abnormal   Collection Time: 03/14/14  7:09 PM  Result Value Ref Range Status   MRSA by PCR POSITIVE (A) NEGATIVE Final    Comment:        The GeneXpert MRSA Assay (FDA approved for NASAL specimens only), is one component of a comprehensive MRSA  colonization surveillance program. It is not intended to diagnose MRSA infection nor to guide or monitor treatment for MRSA infections. RESULT CALLED TO, READ BACK BY AND VERIFIED WITH: SPOKE WITH EARLY,S RN [phone] [phone] COVINGTON,N      Studies: Dg Chest 2 View  03/16/2014   CLINICAL DATA:  Shortness of breath and chest pain. Sickle cell patient.  EXAM: CHEST  2 VIEW  COMPARISON:  03/14/2014  FINDINGS: Left anterior chest wall Port-A-Cath is present with tip projecting over the superior cavoatrial junction. Stable cardiomegaly. No consolidative pulmonary opacities. No pleural effusion or pneumothorax.  IMPRESSION: No acute cardiopulmonary process.   Electronically Signed   By: Lovey Newcomer M.D.   On: 03/16/2014 15:14   Dg Chest 2 View  03/14/2014   CLINICAL DATA:  Dizzy and weak, history of sickle cell  EXAM: CHEST  2 VIEW  COMPARISON:  03/11/2014  FINDINGS: Cardiac shadow remains enlarged. A left chest wall port is again seen. The lungs are clear without focal infiltrate. The bony structures are within normal limits.  IMPRESSION: No acute abnormality is noted. The patient does have a recent diagnosis of acute on chronic pulmonary embolism on 03/11/2014   Electronically Signed   By: Inez Catalina M.D.   On: 03/14/2014 14:58   Dg Chest 2 View  03/11/2014   CLINICAL DATA:  Acute onset of shortness of breath. Decreased O2 saturation. Generalized weakness and pain. Initial encounter.  EXAM: CHEST  2 VIEW  COMPARISON:  Chest radiograph performed 03/06/2014  FINDINGS: The lungs are well-aerated. Vascular congestion is noted. Minimal bilateral atelectasis is seen. There is no evidence of pleural effusion or pneumothorax.  The heart is mildly enlarged. A left-sided chest port is noted ending about the cavoatrial junction. No acute osseous abnormalities are seen.  IMPRESSION: Vascular congestion and mild cardiomegaly. Minimal bilateral atelectasis seen.   Electronically Signed   By: Garald Balding M.D.   On:  03/11/2014 06:24   Dg Chest 2 View  03/06/2014   CLINICAL DATA:  Sickle cell crisis, shortness of breath, upper chest pain for 48 hr.  EXAM: CHEST  2 VIEW  COMPARISON:  Chest radiograph February 23, 2014  FINDINGS: The cardiac silhouette is mild-to-moderately enlarged. Pulmonary vascular congestion,  increased without pleural effusion or focal consolidation. No pneumothorax.  LEFT chest Port-A-Cath with distal tip projecting cavoatrial junction. No pneumothorax. Soft tissue planes and included osseous structures are nonacute.  IMPRESSION: Mild cardiomegaly and pulmonary vascular congestion without focal consolidation.   Electronically Signed   By: Elon Alas   On: 03/06/2014 03:12   Dg Chest 2 View  02/23/2014   CLINICAL DATA:  Sickle cell crisis, cough, congestion  EXAM: CHEST  2 VIEW  COMPARISON:  02/03/2014  FINDINGS: Borderline cardiomegaly again noted. Stable central mild vascular congestion. No definite superimposed infiltrate or pulmonary edema. Stable left Port-A-Cath position. Bony thorax is stable.  IMPRESSION: Borderline cardiomegaly. Central vascular congestion without convincing pulmonary edema. No segmental infiltrate.   Electronically Signed   By: Lahoma Crocker M.D.   On: 02/23/2014 09:36   Ct Head Wo Contrast  03/18/2014   CLINICAL DATA:  Left upper extremity acute weakness, dizziness, slurred speech with expressive dysphasia. History of the sickle cell disease.  EXAM: CT HEAD WITHOUT CONTRAST  TECHNIQUE: Contiguous axial images were obtained from the base of the skull through the vertex without contrast.  COMPARISON:  04/25/2011  FINDINGS: Mild brain atrophy pattern for the patient's young age. Remote caudate lacunar infarct on the left. Periventricular mild chronic white matter microvascular ischemic changes suspected. No acute intracranial hemorrhage, known infarction, mass lesion, midline shift, herniation, hydrocephalus, or extra-axial fluid collection. No focal mass effect or edema.  Cisterns patent. Remote right cerebellar lacunar type infarct. Mild cerebellar atrophy as well.  Mastoids and sinuses remain clear.  No acute osseous finding.  IMPRESSION: No acute intracranial finding.  Remote left caudate and cerebellar lacunar-type infarcts.  Atrophy and chronic white matter microvascular ischemic changes.   Electronically Signed   By: Jerilynn Mages.  Shick M.D.   On: 03/18/2014 15:38   Ct Angio Chest Pe W/cm &/or Wo Cm  03/11/2014   CLINICAL DATA:  Sickle cell disease with sudden onset left-sided chest pain.  EXAM: CT ANGIOGRAPHY CHEST WITH CONTRAST  TECHNIQUE: Multidetector CT imaging of the chest was performed using the standard protocol during bolus administration of intravenous contrast. Multiplanar CT image reconstructions and MIPs were obtained to evaluate the vascular anatomy.  CONTRAST:  118mL OMNIPAQUE IOHEXOL 350 MG/ML SOLN  COMPARISON:  01/01/2014  FINDINGS: THORACIC INLET/BODY WALL:  Left subclavian porta catheter, tip in the right atrium.  MEDIASTINUM:  There is multi focal pulmonary embolism with clot primarily subsegmental but intermittently occlusive. Clot in the posterior segment left upper lobe on image 92, and in the left lower lobe on image 175 and 183 is chronic and without progression from 01/01/2014. The lower lobe clot was previously obscured by artifact, but with the benefit of the current scan was present. There is acute pulmonary embolism to the right upper and right lower lobes, visualized on images 117, 164 and 177.  There is chronic cardiomegaly and right heart enlargement, which limits utility of the RV to LV ratio. The main pulmonary artery is chronically dilated at 37 mm. There is no progression of these findings to suggest acute right heart failure. No pericardial effusion. No aortic dissection. There is chronic mild enlargement of upper mediastinal lymph nodes.  LUNG WINDOWS:  Patchy ground-glass density in the bilateral lungs, with lower lung subsegmental atelectasis or  scarring that is stable from prior and likely fixed, chronic lung disease. No lung infarct noted.  UPPER ABDOMEN:  Splenic auto infarction.  OSSEOUS:  Typical osseous changes of sickle cell disease.  Critical Value/emergent  results were called by telephone at the time of interpretation on 03/11/2014 at 2:00 pm to Dr. Charlynne Cousins , who verbally acknowledged these results.  Review of the MIP images confirms the above findings.  IMPRESSION: Acute on chronic pulmonary embolism with diffuse subsegmental clot. Chronic pulmonary artery hypertension; no morphologic change to suggest acute right heart failure.   Electronically Signed   By: Monte Fantasia M.D.   On: 03/11/2014 14:08   Nm Pulmonary Perf And Vent  03/14/2014   CLINICAL DATA:  Hypoxia.  Sickle cell disease  EXAM: NUCLEAR MEDICINE VENTILATION - PERFUSION LUNG SCAN  Views: Anterior, posterior, left lateral, right lateral, RPO, LPO, RAO, LAO -ventilation and perfusion  Radionuclide: Technetium 73m DTPA -ventilation; Technetium 84m macroaggregated albumin-perfusion  Dose:  35.3 mCi-ventilation; 5.3 mCi-perfusion  Route of administration: Inhalation-ventilation; intravenous-perfusion  COMPARISON:  Chest radiograph March 14, 2014; prior ventilation and perfusion lung scan Jun 19, 2013 ; chest CT angiogram March 11, 2014  FINDINGS: Ventilation: Radiotracer uptake is homogeneous and symmetric bilaterally. Cardiomegaly is noted. No focal ventilation defects are identified. The appearance is stable compared to prior lung scan.  Perfusion: This patient has known peripheral chronic pulmonary emboli based on CT appearance. On the perfusion study, there are peripheral small wedge-shaped defects, stable from prior lung scan consistent with chronic pulmonary emboli in the periphery as noted on recent CT as well. There is no segmental perfusion defect. The appearance is stable compared to prior study.  IMPRESSION: Evidence of small peripheral chronic emboli  bilaterally based on prior lung scan and recent chest CT angiogram appearance. No new pulmonary embolus is appreciable on this study. No segmental perfusion defects seen currently. No ventilation defects identified. Cardiomegaly is noted.   Electronically Signed   By: Lowella Grip III M.D.   On: 03/14/2014 18:41    Scheduled Meds: . sodium chloride   Intravenous Once  . sodium chloride   Intravenous Once  . Chlorhexidine Gluconate Cloth  6 each Topical Q0600  . cholecalciferol  2,000 Units Oral Daily  . diltiazem  120 mg Oral Daily  . folic acid  1 mg Oral q morning - 10a  . HYDROmorphone PCA 2 mg/mL   Intravenous 6 times per day  . hydroxyurea  1,500 mg Oral Q breakfast  . ketorolac  30 mg Intravenous 4 times per day  . lisinopril  10 mg Oral Daily  . metoprolol tartrate  25 mg Oral Daily  . morphine  30 mg Oral Q12H  . mupirocin ointment   Nasal BID  . potassium chloride SA  20 mEq Oral q morning - 10a  . rivaroxaban  15 mg Oral BID WC   Continuous Infusions: . sodium chloride 50 mL/hr at 03/18/14 0762    Principal Problem:   Pulmonary embolus Active Problems:   Vitamin D deficiency   Hypokalemia   Embolism, pulmonary with infarction   Hb-SS disease with crisis   Demand ischemia of myocardium   Sickle cell pain crisis   Hypoxia   PAH (pulmonary artery hypertension)  Time spent 40 minutes.

## 2014-03-19 NOTE — Progress Notes (Signed)
CT Chest reviewed. Will start Cefepime and Vancomycin for HCAP.

## 2014-03-20 DIAGNOSIS — R42 Dizziness and giddiness: Secondary | ICD-10-CM | POA: Diagnosis not present

## 2014-03-20 DIAGNOSIS — I5022 Chronic systolic (congestive) heart failure: Secondary | ICD-10-CM | POA: Diagnosis not present

## 2014-03-20 DIAGNOSIS — R4781 Slurred speech: Secondary | ICD-10-CM | POA: Diagnosis not present

## 2014-03-20 DIAGNOSIS — E559 Vitamin D deficiency, unspecified: Secondary | ICD-10-CM | POA: Diagnosis not present

## 2014-03-20 DIAGNOSIS — R0602 Shortness of breath: Secondary | ICD-10-CM | POA: Diagnosis not present

## 2014-03-20 DIAGNOSIS — I2699 Other pulmonary embolism without acute cor pulmonale: Secondary | ICD-10-CM | POA: Diagnosis not present

## 2014-03-20 DIAGNOSIS — R4702 Dysphasia: Secondary | ICD-10-CM | POA: Diagnosis not present

## 2014-03-20 LAB — COMPREHENSIVE METABOLIC PANEL
ALK PHOS: 69 U/L (ref 39–117)
ALT: 14 U/L (ref 0–53)
AST: 28 U/L (ref 0–37)
Albumin: 3.3 g/dL — ABNORMAL LOW (ref 3.5–5.2)
Anion gap: 9 (ref 5–15)
BILIRUBIN TOTAL: 4.2 mg/dL — AB (ref 0.3–1.2)
BUN: 12 mg/dL (ref 6–23)
CHLORIDE: 104 mmol/L (ref 96–112)
CO2: 23 mmol/L (ref 19–32)
Calcium: 8.6 mg/dL (ref 8.4–10.5)
Creatinine, Ser: 0.5 mg/dL (ref 0.50–1.35)
GFR calc Af Amer: >90 mL/min (ref >90)
GLUCOSE: 126 mg/dL — AB (ref 70–99)
POTASSIUM: 4.2 mmol/L (ref 3.5–5.1)
SODIUM: 136 mmol/L (ref 135–145)
Total Protein: 6.9 g/dL (ref 6.0–8.3)

## 2014-03-20 LAB — CBC WITH DIFFERENTIAL/PLATELET
BASOS ABS: 0.2 10*3/uL — AB (ref 0.0–0.1)
Basophils Relative: 1 % (ref 0–1)
Eosinophils Absolute: 0.6 10*3/uL (ref 0.0–0.7)
Eosinophils Relative: 3 % (ref 0–5)
HCT: 25.6 % — ABNORMAL LOW (ref 39.0–52.0)
Hemoglobin: 8.6 g/dL — ABNORMAL LOW (ref 13.0–17.0)
Lymphocytes Relative: 12 % (ref 12–46)
Lymphs Abs: 2.5 10*3/uL (ref 0.7–4.0)
MCH: 30.6 pg (ref 26.0–34.0)
MCHC: 33.6 g/dL (ref 30.0–36.0)
MCV: 91.1 fL (ref 78.0–100.0)
MONO ABS: 2.1 10*3/uL — AB (ref 0.1–1.0)
Monocytes Relative: 10 % (ref 3–12)
Neutro Abs: 15.2 10*3/uL — ABNORMAL HIGH (ref 1.7–7.7)
Neutrophils Relative %: 74 % (ref 43–77)
PLATELETS: 411 10*3/uL — AB (ref 150–400)
RBC: 2.81 MIL/uL — ABNORMAL LOW (ref 4.22–5.81)
RDW: 22.5 % — AB (ref 11.5–15.5)
WBC: 20.6 10*3/uL — ABNORMAL HIGH (ref 4.0–10.5)

## 2014-03-20 LAB — LACTATE DEHYDROGENASE: LDH: 516 U/L — AB (ref 94–250)

## 2014-03-20 LAB — MAGNESIUM: Magnesium: 1.5 mg/dL (ref 1.5–2.5)

## 2014-03-20 MED ORDER — KETOROLAC TROMETHAMINE 30 MG/ML IJ SOLN
30.0000 mg | Freq: Once | INTRAMUSCULAR | Status: AC
  Administered 2014-03-20: 30 mg via INTRAVENOUS

## 2014-03-20 NOTE — Progress Notes (Signed)
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  YES (If response is "NO", the following Medicare IM given date fields will be blank) Date Medicare IM given:  03/17/2014 Medicare IM given by:  Edwyna Shell Date Additional Medicare IM given:   Additional Medicare IM given by:    Discharge Disposition:  HOME/SELF CARE  Per UR Regulation:    If discussed at [place] of Stay Meetings, dates discussed:    Comments:  03/20/14 Edwyna Shell RN BSN CM [phone] Patient transferred to SDU 2/17. Followed up with patient and explained that he has a deductable through medicare of $300 becasue it seems his Medicaid has lapsed. The patient stated that he lost his mailbox key and has not been able to collect his mail for the last month. Encouraged him to contact DSS to discuss Medicaid status. The patient stated that he will call the [facility] becasue they always help him with any Medicaid problems. Also provided the patient with a 30 day free voucher for Jennye Moccasin and explained that he can use the coupon one time in a 12 month period with Medicare but once his Medicaid is reinstated the coupon is not eligible. Also explained that if ever has difficulty  filling any of his medications to discuss it with the [facility] for resources as they sometimes carry Xaralto samples.  03/18/14 Edwyna Shell RN BSN CM [phone] Patient stated that his PCP is Dr.[name], has a pharmacy, and does not have transportation  concerns. Patient stated that he does not have any DME. If home O2 is needed at time of discharge please document qualifying O2 sats. Thanks

## 2014-03-20 NOTE — Progress Notes (Signed)
Subjective: A 34 year old gentleman admitted with acute chest syndrome as well as sickle cell crisis with pain. Patient has multiple medical problems including left-sided weakness that was worked up for possible CVA. He has been having decreased oxygen saturation I was transfers to step down unit yesterday. So far patient's symptoms are better today. His complaint is mainly pain is 6 out of 10. His oxygen saturation has remained in the 90s on room air. He has been using Dilaudid PCA and has used 42 mg with 79 demands and 76 deliveries. No significant shortness of breath or cough. He is generally feeling weak and tired. His oxygen saturation drops with any movement or activity but as long as he is lying still in bed no osseous asthma in the upper 90s.  Objective: Vital signs in last 24 hours: Temp:  [99 F (37.2 C)-100.3 F (37.9 C)] 100.2 F (37.9 C) (02/18 1700) Pulse Rate:  [86-94] 90 (02/18 1100) Resp:  [18-28] 18 (02/18 1600) BP: (97-164)/(80-97) 125/80 mmHg (02/18 1100) SpO2:  [91 %-96 %] 93 % (02/18 1600) FiO2 (%):  [50 %] 50 % (02/17 2309) Weight:  [72.7 kg (160 lb 4.4 oz)] 72.7 kg (160 lb 4.4 oz) (02/17 2113) Weight change:  Last BM Date: 03/19/14  Intake/Output from previous day: 02/17 0701 - 02/18 0700 In: 1880 [P.O.:480; I.V.:850; IV Piggyback:550] Out: 3550 [Urine:3550] Intake/Output this shift: Total I/O In: 200 [I.V.:150; IV Piggyback:50] Out: 1200 [Urine:1200]  General appearance: alert, cooperative, appears stated age and no distress Eyes: conjunctivae/corneas clear. PERRL, EOM's intact. Fundi benign. Nose: Nares normal. Septum midline. Mucosa normal. No drainage or sinus tenderness. Neck: no adenopathy, no carotid bruit, no JVD, supple, symmetrical, trachea midline and thyroid not enlarged, symmetric, no tenderness/mass/nodules Back: symmetric, no curvature. ROM normal. No CVA tenderness. Resp: clear to auscultation bilaterally Chest wall: no tenderness Cardio:  regular rate and rhythm, S1, S2 normal, no murmur, click, rub or gallop GI: soft, non-tender; bowel sounds normal; no masses,  no organomegaly Extremities: extremities normal, atraumatic, no cyanosis or edema Pulses: 2+ and symmetric Skin: Skin color, texture, turgor normal. No rashes or lesions Neurologic: Grossly normal  Lab Results:  Recent Labs  03/19/14 0227 03/20/14 0607  WBC 22.2* 20.6*  HGB 9.2* 8.6*  HCT 26.8* 25.6*  PLT 357 411*   BMET  Recent Labs  03/19/14 0227 03/20/14 0607  NA 134* 136  K 4.3 4.2  CL 109 104  CO2 20 23  GLUCOSE 107* 126*  BUN 11 12  CREATININE 0.44* 0.50  CALCIUM 8.4 8.6    Studies/Results: Ct Chest W Contrast  03/19/2014   CLINICAL DATA:  Short of breath. Fever since last night. Pulmonary embolism. Acute on chronic pulmonary embolism.  EXAM: CT CHEST WITH CONTRAST  TECHNIQUE: Multidetector CT imaging of the chest was performed during intravenous contrast administration.  CONTRAST:  85mL OMNIPAQUE IOHEXOL 300 MG/ML  SOLN  COMPARISON:  03/11/2013.  FINDINGS: Musculoskeletal: Sickle cell bony changes are present throughout the thoracic spine with a H shaped vertebral bodies. 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  Lungs: There is a peripheral mosaic attenuation pattern consistent with acute chest syndrome. The differential considerations are pulmonary edema, small vessel infarcts with infection or interstitial lung disease unlikely.  Central airways: Patent.  Vasculature: Grossly within normal limits. LEFT subclavian Port-A-Cath terminating in the RIGHT atrium. Cardiomegaly is present. This is a chronic finding and appears similar to the prior CT and radiographs.  Effusions: Tiny bilateral pleural  effusions layering dependently. Small pericardial effusion.  Lymphadenopathy: Unchanged prevascular lymphadenopathy. No axillary adenopathy.  Esophagus: Normal.  Upper abdomen: Periportal edema is present.  Splenic auto infarction.  Other: None.  IMPRESSION: 1. Scattered areas of ground-glass attenuation throughout the lungs bilaterally. Findings are compatible with acute chest syndrome in a patient with sickle cell disease. General differential considerations in acute chest include pulmonary edema, pulmonary hemorrhage, infection/inflammation and fat embolism. Given other findings, fat embolism and pulmonary edema are the most likely considerations. Unchanged cardiomegaly. 2. Small bilateral pleural effusions. Small pericardial effusion and cardiomegaly. 3. Stable prevascular mediastinal adenopathy.   Electronically Signed   By: Dereck Ligas M.D.   On: 03/19/2014 13:24    Medications: I have reviewed the patient's current medications.  Assessment/Plan: A 34 yo admitted with acute chest syndrome and sickle cell painful crisis.  #1. Acute Chest syndrome: Due to Pulmonary Embolism and HCAP. Patient has improved today. Oxygen Sats are in the 90s, on room air. His breathing is now better. Patient however has been been having low oxygen saturations on his ABG. Will repeat ABG in the morning. He already has been told he will need to get home oxygen at discharge. Next  #2 febrile illness: Most likely secondary to healthcare associated pneumonia. Patient is due to of Vanco and cefepime we will continue.  #3 pulmonary hypertension: This may be related to recurrent pulmonary embolism as well as sickle cell disease. Patient is being followed by cardiology.   #4 chronic systolic dysfunction: Patient seems to be compensated at this point.  #5 acute on chronic pulmonary embolism: Patient is back on his around to her. We'll continue with the dosing.  #6 sickle cell painful crisis: Patient still on Dilaudid PCA. His pain is down to 6 out of 10 today. He has used about 42 mg of the Dilaudid in the last 24 hours. We will maintain him on current regimen. Next  #7 sickle cell anemia: Hemoglobin seems much better now  after multiple transfusions. We'll monitor his H&H. Next  #8 left-sided weakness: This seems to have resolved. He might require outpatient follow-up with neurology and father workup.  LOS: 6 days   Jaydeen Odor,[name] 03/20/2014, 5:50 PM

## 2014-03-21 DIAGNOSIS — R0602 Shortness of breath: Secondary | ICD-10-CM | POA: Diagnosis not present

## 2014-03-21 DIAGNOSIS — R42 Dizziness and giddiness: Secondary | ICD-10-CM | POA: Diagnosis not present

## 2014-03-21 DIAGNOSIS — I2699 Other pulmonary embolism without acute cor pulmonale: Secondary | ICD-10-CM | POA: Diagnosis not present

## 2014-03-21 DIAGNOSIS — I5022 Chronic systolic (congestive) heart failure: Secondary | ICD-10-CM | POA: Diagnosis not present

## 2014-03-21 DIAGNOSIS — R4781 Slurred speech: Secondary | ICD-10-CM | POA: Diagnosis not present

## 2014-03-21 DIAGNOSIS — R4702 Dysphasia: Secondary | ICD-10-CM | POA: Diagnosis not present

## 2014-03-21 DIAGNOSIS — E559 Vitamin D deficiency, unspecified: Secondary | ICD-10-CM | POA: Diagnosis not present

## 2014-03-21 LAB — CBC WITH DIFFERENTIAL/PLATELET
Basophils Absolute: 0.2 10*3/uL — ABNORMAL HIGH (ref 0.0–0.1)
Basophils Relative: 1 % (ref 0–1)
EOS ABS: 0.6 10*3/uL (ref 0.0–0.7)
Eosinophils Relative: 4 % (ref 0–5)
HEMATOCRIT: 26.3 % — AB (ref 39.0–52.0)
HEMOGLOBIN: 8.8 g/dL — AB (ref 13.0–17.0)
Lymphocytes Relative: 13 % (ref 12–46)
Lymphs Abs: 2.1 10*3/uL (ref 0.7–4.0)
MCH: 30.7 pg (ref 26.0–34.0)
MCHC: 33.5 g/dL (ref 30.0–36.0)
MCV: 91.6 fL (ref 78.0–100.0)
MONO ABS: 1.9 10*3/uL — AB (ref 0.1–1.0)
Monocytes Relative: 12 % (ref 3–12)
NEUTROS ABS: 11 10*3/uL — AB (ref 1.7–7.7)
Neutrophils Relative %: 70 % (ref 43–77)
Platelets: 495 10*3/uL — ABNORMAL HIGH (ref 150–400)
RBC: 2.87 MIL/uL — ABNORMAL LOW (ref 4.22–5.81)
RDW: 22.7 % — ABNORMAL HIGH (ref 11.5–15.5)
WBC: 15.8 10*3/uL — ABNORMAL HIGH (ref 4.0–10.5)

## 2014-03-21 LAB — COMPREHENSIVE METABOLIC PANEL
ALT: 14 U/L (ref 0–53)
AST: 30 U/L (ref 0–37)
Albumin: 3.6 g/dL (ref 3.5–5.2)
Alkaline Phosphatase: 69 U/L (ref 39–117)
Anion gap: 9 (ref 5–15)
BILIRUBIN TOTAL: 4.6 mg/dL — AB (ref 0.3–1.2)
BUN: 11 mg/dL (ref 6–23)
CHLORIDE: 103 mmol/L (ref 96–112)
CO2: 22 mmol/L (ref 19–32)
CREATININE: 0.43 mg/dL — AB (ref 0.50–1.35)
Calcium: 8.9 mg/dL (ref 8.4–10.5)
GFR calc non Af Amer: >90 mL/min (ref >90)
Glucose, Bld: 111 mg/dL — ABNORMAL HIGH (ref 70–99)
Potassium: 4 mmol/L (ref 3.5–5.1)
SODIUM: 134 mmol/L — AB (ref 135–145)
Total Protein: 7.8 g/dL (ref 6.0–8.3)

## 2014-03-21 LAB — VANCOMYCIN, TROUGH: VANCOMYCIN TR: 10.4 ug/mL (ref 10.0–20.0)

## 2014-03-21 MED ORDER — VANCOMYCIN HCL 10 G IV SOLR
1250.0000 mg | Freq: Three times a day (TID) | INTRAVENOUS | Status: DC
  Administered 2014-03-21 – 2014-03-24 (×8): 1250 mg via INTRAVENOUS
  Filled 2014-03-21 (×10): qty 1250

## 2014-03-21 MED ORDER — HYDROMORPHONE 2 MG/ML HIGH CONCENTRATION IV PCA SOLN
INTRAVENOUS | Status: DC
  Administered 2014-03-21: 9.5 mg via INTRAVENOUS
  Administered 2014-03-21: 8 mg via INTRAVENOUS
  Administered 2014-03-22: 6 mg via INTRAVENOUS
  Administered 2014-03-22: 13 mg via INTRAVENOUS
  Administered 2014-03-22: 6 mg via INTRAVENOUS
  Administered 2014-03-22: 1.5 mg via INTRAVENOUS
  Administered 2014-03-22: 4 mg via INTRAVENOUS
  Administered 2014-03-23: 4.5 mg via INTRAVENOUS
  Administered 2014-03-23: 01:00:00 via INTRAVENOUS
  Administered 2014-03-23: 7.9 mg via INTRAVENOUS
  Administered 2014-03-23: 10 mg via INTRAVENOUS
  Administered 2014-03-23: 2.5 mg via INTRAVENOUS
  Administered 2014-03-23: 7.5 mg via INTRAVENOUS
  Administered 2014-03-23: 9.5 mg via INTRAVENOUS
  Administered 2014-03-24: 10 mg via INTRAVENOUS
  Administered 2014-03-24: 4.5 mg via INTRAVENOUS
  Administered 2014-03-24: 9 mg via INTRAVENOUS
  Administered 2014-03-24: 10 mg via INTRAVENOUS
  Administered 2014-03-24: 05:00:00 via INTRAVENOUS
  Filled 2014-03-21 (×3): qty 25

## 2014-03-21 NOTE — Progress Notes (Signed)
Subjective: Patient doing better today. He has been refusing some blood work but has remained on room air all yesterday with Sats in the upper 90's. Has been on Dilaudid PCA and has used 37.7 mg with 59 demands and 59 deliveries in the last 24 hours. Pain is now 5/10. No SOB, No cough, No NVD. Objective: Vital signs in last 24 hours: Temp:  [99.1 F (37.3 C)-101.3 F (38.5 C)] 99.1 F (37.3 C) (02/19 0800) Pulse Rate:  [78-96] 78 (02/19 0605) Resp:  [16-24] 16 (02/19 0751) BP: (113-126)/(63-97) 120/79 mmHg (02/19 0605) SpO2:  [93 %-100 %] 97 % (02/19 0751) FiO2 (%):  [21 %] 21 % (02/19 0545) Weight change:  Last BM Date: 03/19/14  Intake/Output from previous day: 02/18 0701 - 02/19 0700 In: 2530 [P.O.:480; I.V.:1200; IV Piggyback:850] Out: 3825 [Urine:3825] Intake/Output this shift:    General appearance: alert, cooperative, appears stated age and no distress Eyes: conjunctivae/corneas clear. PERRL, EOM's intact. Fundi benign. Nose: Nares normal. Septum midline. Mucosa normal. No drainage or sinus tenderness. Neck: no adenopathy, no carotid bruit, no JVD, supple, symmetrical, trachea midline and thyroid not enlarged, symmetric, no tenderness/mass/nodules Back: symmetric, no curvature. ROM normal. No CVA tenderness. Resp: clear to auscultation bilaterally Chest wall: no tenderness Cardio: regular rate and rhythm, S1, S2 normal, no murmur, click, rub or gallop GI: soft, non-tender; bowel sounds normal; no masses,  no organomegaly Extremities: extremities normal, atraumatic, no cyanosis or edema Pulses: 2+ and symmetric Skin: Skin color, texture, turgor normal. No rashes or lesions Neurologic: Grossly normal  Lab Results:  Recent Labs  03/20/14 0607 03/21/14 0550  WBC 20.6* 15.8*  HGB 8.6* 8.8*  HCT 25.6* 26.3*  PLT 411* 495*   BMET  Recent Labs  03/20/14 0607 03/21/14 0550  NA 136 134*  K 4.2 4.0  CL 104 103  CO2 23 22  GLUCOSE 126* 111*  BUN 12 11   CREATININE 0.50 0.43*  CALCIUM 8.6 8.9    Studies/Results: No results found.  Medications: I have reviewed the patient's current medications.  Assessment/Plan: A 34 yo admitted with acute chest syndrome and sickle cell painful crisis.  #1. Acute Chest syndrome: Much improved. Will transfer to tele bed and continue abx as well as supportive care.   #2 febrile illness: Resolved. Mostl likely due to HCAP. Continue treatment and monitoring.  #3 pulmonary hypertension: Patient is being followed by cardiology as out patient. Will continue.  #4 chronic systolic dysfunction:Compensated at this point. Continue oxygenation.  #5 acute on chronic pulmonary embolism:Continue xarelto.  #6 sickle cell painful crisis: Will maintain patient on Dilaudid PCa but decrease the dose. Oral medications will be continued with MS Contin.  #7. Sickle Cell anemia: H/H stable. Continue current treatment.  #8 left-sided weakness: This seems to have resolved.    LOS: 7 days   [name],[name] 03/21/2014, 12:49 PM

## 2014-03-21 NOTE — Progress Notes (Signed)
Patient refused morning ABG. Explained the importance but he still refused.

## 2014-03-21 NOTE — Progress Notes (Signed)
ANTIBIOTIC CONSULT NOTE   Pharmacy Consult for Vancomycin/Cefepime Indication: rule out pneumonia  No Known Allergies  Patient Measurements: Height: 6' (182.9 cm) Weight: 160 lb 4.4 oz (72.7 kg) IBW/kg (Calculated) : 77.6 Adjusted Body Weight: n/a  Vital Signs: Temp: 98.2 F (36.8 C) (02/19 1430) Temp Source: Oral (02/19 1430) BP: 110/63 mmHg (02/19 1430) Pulse Rate: 81 (02/19 1430) Intake/Output from previous day: 02/18 0701 - 02/19 0700 In: 2530 [P.O.:480; I.V.:1200; IV Piggyback:850] Out: 3825 [Urine:3825] Intake/Output from this shift:    Labs:  Recent Labs  03/19/14 0227 03/20/14 0607 03/21/14 0550  WBC 22.2* 20.6* 15.8*  HGB 9.2* 8.6* 8.8*  PLT 357 411* 495*  CREATININE 0.44* 0.50 0.43*   Estimated Creatinine Clearance: 133.8 mL/min (by C-G formula based on Cr of 0.43).  Recent Labs  03/21/14 1350  VANCOTROUGH 10.4     Microbiology: Recent Results (from the past 720 hour(s))  MRSA PCR Screening     Status: Abnormal   Collection Time: 03/06/14 12:28 PM  Result Value Ref Range Status   MRSA by PCR POSITIVE (A) NEGATIVE Final    Comment:        The GeneXpert MRSA Assay (FDA approved for NASAL specimens only), is one component of a comprehensive MRSA colonization surveillance program. It is not intended to diagnose MRSA infection nor to guide or monitor treatment for MRSA infections. RESULT CALLED TO, READ BACK BY AND VERIFIED WITH: GAGLIANO,S @1421  ON [id number] BY POTEAT,S   MRSA PCR Screening     Status: Abnormal   Collection Time: 03/14/14  7:09 PM  Result Value Ref Range Status   MRSA by PCR POSITIVE (A) NEGATIVE Final    Comment:        The GeneXpert MRSA Assay (FDA approved for NASAL specimens only), is one component of a comprehensive MRSA colonization surveillance program. It is not intended to diagnose MRSA infection nor to guide or monitor treatment for MRSA infections. RESULT CALLED TO, READ BACK BY AND VERIFIED WITH: SPOKE  WITH EARLY,S RN [phone] COVINGTON,N   Culture, blood (routine x 2)     Status: None (Preliminary result)   Collection Time: 03/19/14 12:10 AM  Result Value Ref Range Status   Specimen Description BLOOD RIGHT FOREARM  Final   Special Requests BOTTLES DRAWN AEROBIC ONLY 2CC  Final   Culture   Final           BLOOD CULTURE RECEIVED NO GROWTH TO DATE CULTURE WILL BE HELD FOR 5 DAYS BEFORE ISSUING A FINAL NEGATIVE REPORT Note: Culture results may be compromised due to an inadequate volume of blood received in culture bottles. Performed at [facility]    Report Status PENDING  Incomplete  Culture, blood (routine x 2)     Status: None (Preliminary result)   Collection Time: 03/19/14 12:15 AM  Result Value Ref Range Status   Specimen Description BLOOD RIGHT HAND  Final   Special Requests BOTTLES DRAWN AEROBIC ONLY 2CC  Final   Culture   Final           BLOOD CULTURE RECEIVED NO GROWTH TO DATE CULTURE WILL BE HELD FOR 5 DAYS BEFORE ISSUING A FINAL NEGATIVE REPORT Note: Culture results may be compromised due to an inadequate volume of blood received in culture bottles. Performed at [facility]    Report Status PENDING  Incomplete    Anti-infectives    Start     Dose/Rate Route Frequency Ordered Stop   03/19/14 1700  vancomycin (  VANCOCIN) IVPB 1000 mg/200 mL premix     1,000 mg 200 mL/hr over 60 Minutes Intravenous 3 times per day 03/19/14 1605     03/19/14 1700  ceFEPIme (MAXIPIME) 1 g in dextrose 5 % 50 mL IVPB     1 g 100 mL/hr over 30 Minutes Intravenous 3 times per day 03/19/14 1605       Assessment 33yoM with sickle-cell disease recently treated at [facility] for acute PE and discharged 2/11; returned 2/12 with acute chest syndrome related to PE and pneumonia. New CT 2/17 chest cannot rule out infection as contributing to continued WOB; pharmacy consulted to start Vanc/Cefepime to r/o pulmonary infectious process.  2/17 >> Vancomycin >> 2/17 >> Cefepime >>  Tmax:  101.3 WBC: elevated, but decreasing, 15.8 (PE and acute chest) Renal function: SCr 0.43, CrCl > 100 CG/N  2/12 MRSA screen: positive 2/17 Blood x2: NGTD  2/17: CT compatible w/ acute chest. Differential in acute chest include: PE, hemorrhage, infection/inflammation and fat embolism. Given other findings, fat embolism and pulmonary edema are the most likely  Drug level / dose changes info: 2/19 1350 = 10.4 mcg/ml on vancomycin 1250mg  IV q8h (prior to 7 dose), change to 1250mg  IV q8h  Goal of Therapy:   Vancomycin trough level 15-20 mcg/ml   Eradication of infection  Appropriate antibiotic dosing for indication and renal function  Plan:  Day #3 vancomycin/cefepime  Increase vancomycin 1.25g IV q8h  Continue cefepime 1g IV q8h  Recheck vancomycin trough levels at steady state as indicated  Follow clinical course, culture results as available, renal function  Evaluate for de-escalation of antibiotics    Doreene Eland, PharmD, BCPS.   Pager: [phone] 03/21/2014, 2:37 PM

## 2014-03-21 NOTE — Care Management Note (Addendum)
    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  YES (If response is "NO", the following Medicare IM given date fields will be blank) Date Medicare IM given:  03/17/2014 Medicare IM given by:  Edwyna Shell Date Additional Medicare IM given:  03/24/2014 Additional Medicare IM given by:  [facility]  Discharge Disposition:  HOME/SELF CARE  Per UR Regulation:  Reviewed for med. necessity/level of care/duration of stay  If discussed at [place] of Stay Meetings, dates discussed:   03/20/2014    Comments:  03/24/14 Dessa Phi RN BSN NCM [phone] Noted patient's 02 sats ra 99%, does not qualify for home 02.Nurse aware. D/C home no needs or orders.  03/21/14 Dessa Phi RN BSN NCM [phone] Transfer from SDU.See prior CM note-has xarelto free 30day discount card.No further  anticipated d/c needs.  03/20/14 Edwyna Shell RN BSN CM [phone] Patient transferred to SDU 2/17. Followed up with patient and explained that he has a deductable through medicare of $300 becasue it seems his Medicaid has lapsed. The patient stated that he lost his mailbox key and has not been able to collect his mail for the last month. Encouraged him to contact DSS to discuss Medicaid status. The patient stated that he will call the [facility] becasue they always help him with any Medicaid problems. Also provided the patient with a 30 day free voucher for Jennye Moccasin and  explained that he can use the coupon one time in a 12 month period with Medicare but once his Medicaid is reinstated the coupon is not eligible. Also explained that if ever has difficulty  filling any of his medications to discuss it with the [facility] for resources as they sometimes carry Xaralto samples.  03/18/14 Edwyna Shell RN BSN CM [phone] Patient stated that his PCP is Dr.[name], has a pharmacy, and does not have transportation concerns. Patient stated that he does not have any DME. If home O2 is needed at time of discharge please document qualifying O2 sats. Thanks

## 2014-03-22 DIAGNOSIS — R0602 Shortness of breath: Secondary | ICD-10-CM | POA: Diagnosis not present

## 2014-03-22 DIAGNOSIS — R42 Dizziness and giddiness: Secondary | ICD-10-CM | POA: Diagnosis not present

## 2014-03-22 DIAGNOSIS — E559 Vitamin D deficiency, unspecified: Secondary | ICD-10-CM | POA: Diagnosis not present

## 2014-03-22 DIAGNOSIS — I5022 Chronic systolic (congestive) heart failure: Secondary | ICD-10-CM | POA: Diagnosis not present

## 2014-03-22 DIAGNOSIS — I2699 Other pulmonary embolism without acute cor pulmonale: Secondary | ICD-10-CM | POA: Diagnosis not present

## 2014-03-22 DIAGNOSIS — R4781 Slurred speech: Secondary | ICD-10-CM | POA: Diagnosis not present

## 2014-03-22 DIAGNOSIS — R4702 Dysphasia: Secondary | ICD-10-CM | POA: Diagnosis not present

## 2014-03-22 LAB — CBC WITH DIFFERENTIAL/PLATELET
Basophils Absolute: 0.1 10*3/uL (ref 0.0–0.1)
Basophils Relative: 1 % (ref 0–1)
Eosinophils Absolute: 0.8 10*3/uL — ABNORMAL HIGH (ref 0.0–0.7)
Eosinophils Relative: 6 % — ABNORMAL HIGH (ref 0–5)
HEMATOCRIT: 23.8 % — AB (ref 39.0–52.0)
Hemoglobin: 8 g/dL — ABNORMAL LOW (ref 13.0–17.0)
LYMPHS ABS: 2.2 10*3/uL (ref 0.7–4.0)
Lymphocytes Relative: 17 % (ref 12–46)
MCH: 31.1 pg (ref 26.0–34.0)
MCHC: 33.6 g/dL (ref 30.0–36.0)
MCV: 92.6 fL (ref 78.0–100.0)
MONOS PCT: 12 % (ref 3–12)
Monocytes Absolute: 1.5 10*3/uL — ABNORMAL HIGH (ref 0.1–1.0)
Neutro Abs: 8.3 10*3/uL — ABNORMAL HIGH (ref 1.7–7.7)
Neutrophils Relative %: 64 % (ref 43–77)
Platelets: 542 10*3/uL — ABNORMAL HIGH (ref 150–400)
RBC: 2.57 MIL/uL — ABNORMAL LOW (ref 4.22–5.81)
RDW: 22.5 % — AB (ref 11.5–15.5)
WBC: 12.9 10*3/uL — ABNORMAL HIGH (ref 4.0–10.5)

## 2014-03-22 LAB — COMPREHENSIVE METABOLIC PANEL
ALBUMIN: 3.5 g/dL (ref 3.5–5.2)
ALT: 19 U/L (ref 0–53)
AST: 31 U/L (ref 0–37)
Alkaline Phosphatase: 65 U/L (ref 39–117)
Anion gap: 6 (ref 5–15)
BILIRUBIN TOTAL: 3.3 mg/dL — AB (ref 0.3–1.2)
BUN: 13 mg/dL (ref 6–23)
CALCIUM: 8.8 mg/dL (ref 8.4–10.5)
CO2: 22 mmol/L (ref 19–32)
Chloride: 105 mmol/L (ref 96–112)
Creatinine, Ser: 0.54 mg/dL (ref 0.50–1.35)
GFR calc Af Amer: >90 mL/min (ref >90)
Glucose, Bld: 110 mg/dL — ABNORMAL HIGH (ref 70–99)
Potassium: 3.8 mmol/L (ref 3.5–5.1)
Sodium: 133 mmol/L — ABNORMAL LOW (ref 135–145)
Total Protein: 7.6 g/dL (ref 6.0–8.3)

## 2014-03-22 NOTE — Progress Notes (Signed)
Subjective: Patient doing better today. No significant shortness of breath or cough. No fever in the last 24 hours. Patient's pain is at 6 out of 10. He is on the Dilaudid PCA and use 44.2 mg with 80 demands and 80 deliveries. He is feeling weak otherwise no  Nausea or vomiting or diarrhea. He has been on vancomycin and cefepime.  Also on Xarelto.  Objective: Vital signs in last 24 hours: Temp:  [97.8 F (36.6 C)-99.2 F (37.3 C)] 97.8 F (36.6 C) (02/20 1452) Pulse Rate:  [79-85] 82 (02/20 1452) Resp:  [15-22] 18 (02/20 1452) BP: (106-114)/(61-79) 107/64 mmHg (02/20 1452) SpO2:  [96 %-99 %] 98 % (02/20 1452) Weight change:  Last BM Date: 03/19/14  Intake/Output from previous day: 02/19 0701 - 02/20 0700 In: 2600 [P.O.:1200; I.V.:600; IV Piggyback:800] Out: 5631 [Urine:1825] Intake/Output this shift: Total I/O In: 360 [P.O.:360] Out: 1850 [Urine:1850]  General appearance: alert, cooperative, appears stated age and no distress Eyes: conjunctivae/corneas clear. PERRL, EOM's intact. Fundi benign. Nose: Nares normal. Septum midline. Mucosa normal. No drainage or sinus tenderness. Neck: no adenopathy, no carotid bruit, no JVD, supple, symmetrical, trachea midline and thyroid not enlarged, symmetric, no tenderness/mass/nodules Back: symmetric, no curvature. ROM normal. No CVA tenderness. Resp: clear to auscultation bilaterally Chest wall: no tenderness Cardio: regular rate and rhythm, S1, S2 normal, no murmur, click, rub or gallop GI: soft, non-tender; bowel sounds normal; no masses,  no organomegaly Extremities: extremities normal, atraumatic, no cyanosis or edema Pulses: 2+ and symmetric Skin: Skin color, texture, turgor normal. No rashes or lesions Neurologic: Grossly normal  Lab Results:  Recent Labs  03/21/14 0550 03/22/14 0535  WBC 15.8* 12.9*  HGB 8.8* 8.0*  HCT 26.3* 23.8*  PLT 495* 542*   BMET  Recent Labs  03/21/14 0550 03/22/14 0535  NA 134* 133*  K 4.0  3.8  CL 103 105  CO2 22 22  GLUCOSE 111* 110*  BUN 11 13  CREATININE 0.43* 0.54  CALCIUM 8.9 8.8    Studies/Results: No results found.  Medications: I have reviewed the patient's current medications.  Assessment/Plan: A 34 yo admitted with acute chest syndrome and sickle cell painful crisis.  #1. Acute Chest syndrome:Resolved. We'll likely discharge on oral medications tomorrow.  #2 febrile illness: Resolved.   #3 pulmonary hypertension: stable. For outpatient management.  #4 chronic systolic dysfunction:Compensated at this point. Continue oxygenation.  #5 acute on chronic pulmonary embolism:Continue xarelto.  #6 sickle cell painful crisis: Oral medications will be continued with MS Contin. We'll continue to decrease his PCA dose.  #7. Sickle Cell anemia: H/H stable. Continue current treatment.  #8 left-sided weakness: This seems to have resolved.    LOS: 8 days   [name],[name] 03/22/2014, 4:45 PM

## 2014-03-22 NOTE — Progress Notes (Signed)
Agree with previous RN assessment. Will continue to monitor pt.

## 2014-03-23 DIAGNOSIS — I2699 Other pulmonary embolism without acute cor pulmonale: Secondary | ICD-10-CM | POA: Diagnosis not present

## 2014-03-23 NOTE — Progress Notes (Signed)
Subjective: Patient doing better today. He is on Dilaudid PCA and has used 38 mg with 72 demands and 68 deliveries in the last 24 hours. His pain now is at 6 out of 10 no shortness of breath no cough no nausea vomiting or diarrhea. He has been off oxygen all day yesterday and today. Objective: Vital signs in last 24 hours: Temp:  [97.8 F (36.6 C)-99 F (37.2 C)] 97.9 F (36.6 C) (02/21 0559) Pulse Rate:  [70-83] 70 (02/21 0559) Resp:  [11-22] 17 (02/21 0800) BP: (107-110)/(63-66) 110/63 mmHg (02/21 0559) SpO2:  [96 %-99 %] 99 % (02/21 0800) Weight:  [69.582 kg (153 lb 6.4 oz)] 69.582 kg (153 lb 6.4 oz) (02/21 0559) Weight change:  Last BM Date: 03/22/14  Intake/Output from previous day: 02/20 0701 - 02/21 0700 In: 1670 [P.O.:720; IV Piggyback:950] Out: 6644 [Urine:3525] Intake/Output this shift:    General appearance: alert, cooperative, appears stated age and no distress Eyes: conjunctivae/corneas clear. PERRL, EOM's intact. Fundi benign. Nose: Nares normal. Septum midline. Mucosa normal. No drainage or sinus tenderness. Neck: no adenopathy, no carotid bruit, no JVD, supple, symmetrical, trachea midline and thyroid not enlarged, symmetric, no tenderness/mass/nodules Back: symmetric, no curvature. ROM normal. No CVA tenderness. Resp: clear to auscultation bilaterally Chest wall: no tenderness Cardio: regular rate and rhythm, S1, S2 normal, no murmur, click, rub or gallop GI: soft, non-tender; bowel sounds normal; no masses,  no organomegaly Extremities: extremities normal, atraumatic, no cyanosis or edema Pulses: 2+ and symmetric Skin: Skin color, texture, turgor normal. No rashes or lesions Neurologic: Grossly normal  Lab Results:  Recent Labs  03/21/14 0550 03/22/14 0535  WBC 15.8* 12.9*  HGB 8.8* 8.0*  HCT 26.3* 23.8*  PLT 495* 542*   BMET  Recent Labs  03/21/14 0550 03/22/14 0535  NA 134* 133*  K 4.0 3.8  CL 103 105  CO2 22 22  GLUCOSE 111* 110*  BUN 11  13  CREATININE 0.43* 0.54  CALCIUM 8.9 8.8    Studies/Results: No results found.  Medications: I have reviewed the patient's current medications.  Assessment/Plan: A 34 yo admitted with acute chest syndrome and sickle cell painful crisis.  #1. Acute Chest syndrome:Resolved. Patient is to much better. Continue empiric antibiotics.  #2 febrile illness: Resolved.   #3 pulmonary hypertension: stable. Continue outpatient management.  #4 chronic systolic dysfunction:Compensated at this point. Continue oxygenation.  #5 acute on chronic pulmonary embolism:Continue xarelto.  #6 sickle cell painful crisis: Oral medications will be continued with MS Contin. We'll continue to decrease his PCA dose. Anticipate discharge tomorrow  #7. Sickle Cell anemia: H/H stable. Continue current treatment.  #8 left-sided weakness: This seems to have resolved.    LOS: 9 days   [name],[name] 03/23/2014, 12:08 PM

## 2014-03-24 DIAGNOSIS — I2699 Other pulmonary embolism without acute cor pulmonale: Secondary | ICD-10-CM | POA: Diagnosis not present

## 2014-03-24 MED ORDER — HEPARIN SOD (PORK) LOCK FLUSH 100 UNIT/ML IV SOLN
500.0000 [IU] | INTRAVENOUS | Status: AC | PRN
  Administered 2014-03-24: 500 [IU]

## 2014-03-24 MED ORDER — AMOXICILLIN-POT CLAVULANATE 875-125 MG PO TABS: 1.0000 | ORAL_TABLET | Freq: Two times a day (BID) | ORAL | Status: DC

## 2014-03-24 NOTE — Progress Notes (Signed)
03/24/14  1422  Wasted High Concentrate PCA Diluadid. 41mL in sink with Janett Billow, Agricultural consultant.

## 2014-03-24 NOTE — Discharge Summary (Addendum)
Physician Discharge Summary  Patient ID: [name] MRN: [id number] DOB/AGE: [birth year] 34 y.o.  Admit date: 03/14/2014 Discharge date: 03/24/2014  Admission Diagnoses:  Discharge Diagnoses:  Principal Problem:   Pulmonary embolus Active Problems:   Vitamin D deficiency   Hypokalemia   Embolism, pulmonary with infarction   Hb-SS disease with crisis   Demand ischemia of myocardium   Sickle cell pain crisis   Hypoxia   PAH (pulmonary artery hypertension)   Discharged Condition: good  Hospital Course: Patient is a 34 year old gentleman with known history of sickle cell disease who was admitted with sickle cell acute chest syndrome as well as sickle cell painful crisis. Patient was profoundly hypoxemic from his acute chest syndrome as well as pulmonary hypertension. He has been noncompliant with his anticoagulation and recently had another pulmonary embolism. In the hospital he was monitored initially in the ICU started on antibiotics for healthcare associated pneumonia especially after CT scan of his chest showed infiltrates. Patient has responded to treatment very well. We attempted to set him off with home oxygen which he qualifies for in the beginning of hospitalization however at the time of discharge he did not meet the criteria for home oxygen so no home oxygen was given. He also was on Dilaudid PCA in addition to Toradol and other supportive care. At the time of discharge he was back to his baseline. He was instructed to follow-up with his primary care physician, cardiology for his pulmonary hypertension and pulmonology as needed.  Consults: pulmonary/intensive care  Significant Diagnostic Studies: labs: Serial CBCs, CMP is, ABG and reticulocyte count. Most showed significant anemia with hypoxemia PaO2 of 56% at some point and radiology: CXR: infiltrates: lower lobe bilaterally and CT scan: Showing significant bilateral infiltrates  Treatments: IV hydration, antibiotics:  vancomycin, Levaquin and cefepime and analgesia: acetaminophen and Dilaudid  Discharge Exam: Blood pressure 110/64, pulse 77, temperature 98.7 F (37.1 C), temperature source Oral, resp. rate 14, height 6' (1.829 m), weight 69.446 kg (153 lb 1.6 oz), SpO2 99 %. General appearance: alert, cooperative, appears stated age and no distress Eyes: conjunctivae/corneas clear. PERRL, EOM's intact. Fundi benign. Neck: no adenopathy, no carotid bruit, no JVD, supple, symmetrical, trachea midline and thyroid not enlarged, symmetric, no tenderness/mass/nodules Back: symmetric, no curvature. ROM normal. No CVA tenderness. Resp: clear to auscultation bilaterally Chest wall: no tenderness Cardio: regular rate and rhythm, S1, S2 normal, no murmur, click, rub or gallop GI: soft, non-tender; bowel sounds normal; no masses,  no organomegaly Extremities: extremities normal, atraumatic, no cyanosis or edema Pulses: 2+ and symmetric Skin: Skin color, texture, turgor normal. No rashes or lesions Neurologic: Grossly normal  Disposition: 01-Home or Self Care     Medication List    TAKE these medications        amoxicillin-clavulanate 875-125 MG per tablet  Commonly known as:  AUGMENTIN  Take 1 tablet by mouth 2 (two) times daily.     diltiazem 120 MG 24 hr capsule  Commonly known as:  CARDIZEM CD  Take 1 capsule (120 mg total) by mouth daily.     folic acid 1 MG tablet  Commonly known as:  FOLVITE  Take 1 tablet (1 mg total) by mouth every morning.     HYDROmorphone 4 MG tablet  Commonly known as:  DILAUDID  Take 1 tablet (4 mg total) by mouth every 4 (four) hours as needed for severe pain.     hydroxyurea 500 MG capsule  Commonly known as:  HYDREA  Take  3 capsules (1,500 mg total) by mouth daily. May take with food to minimize GI side effects.     lisinopril 10 MG tablet  Commonly known as:  PRINIVIL,ZESTRIL  Take 1 tablet (10 mg total) by mouth daily.     metoprolol tartrate 25 MG tablet   Commonly known as:  LOPRESSOR  Take 1 tablet (25 mg total) by mouth daily.     morphine 30 MG 12 hr tablet  Commonly known as:  MS CONTIN  Take 1 tablet (30 mg total) by mouth every 12 (twelve) hours.     potassium chloride SA 20 MEQ tablet  Commonly known as:  K-DUR,KLOR-CON  Take 1 tablet (20 mEq total) by mouth every morning.     rivaroxaban 20 MG Tabs tablet  Commonly known as:  XARELTO  Take 1 tablet (20 mg total) by mouth every morning.     Vitamin D 2000 UNITS tablet  Take 1 tablet (2,000 Units total) by mouth daily.     zolpidem 10 MG tablet  Commonly known as:  AMBIEN  Take 1 tablet (10 mg total) by mouth at bedtime as needed for sleep.         SignedBarbette Merino 03/24/2014, 12:38 PM  Time spent 35 minutes

## 2014-03-25 LAB — CULTURE, BLOOD (ROUTINE X 2)
Culture: NO GROWTH
Culture: NO GROWTH

## 2014-03-31 ENCOUNTER — Encounter (HOSPITAL_COMMUNITY): Payer: Self-pay

## 2014-03-31 ENCOUNTER — Emergency Department (HOSPITAL_COMMUNITY): Payer: Medicare Other

## 2014-03-31 ENCOUNTER — Emergency Department (HOSPITAL_COMMUNITY)
Admission: EM | Admit: 2014-03-31 | Discharge: 2014-03-31 | Discharge status: Against Medical Advice | Payer: Medicare Other | Attending: Emergency Medicine | Admitting: Emergency Medicine

## 2014-03-31 ENCOUNTER — Telehealth (HOSPITAL_COMMUNITY): Payer: Self-pay | Admitting: Hematology

## 2014-03-31 DIAGNOSIS — F1410 Cocaine abuse, uncomplicated: Secondary | ICD-10-CM | POA: Insufficient documentation

## 2014-03-31 DIAGNOSIS — I10 Essential (primary) hypertension: Secondary | ICD-10-CM | POA: Insufficient documentation

## 2014-03-31 DIAGNOSIS — Z86711 Personal history of pulmonary embolism: Secondary | ICD-10-CM | POA: Diagnosis not present

## 2014-03-31 DIAGNOSIS — Z9119 Patient's noncompliance with other medical treatment and regimen: Secondary | ICD-10-CM | POA: Insufficient documentation

## 2014-03-31 DIAGNOSIS — E876 Hypokalemia: Secondary | ICD-10-CM | POA: Insufficient documentation

## 2014-03-31 DIAGNOSIS — Z87891 Personal history of nicotine dependence: Secondary | ICD-10-CM | POA: Insufficient documentation

## 2014-03-31 DIAGNOSIS — Z8739 Personal history of other diseases of the musculoskeletal system and connective tissue: Secondary | ICD-10-CM | POA: Diagnosis not present

## 2014-03-31 DIAGNOSIS — D5700 Hb-SS disease with crisis, unspecified: Secondary | ICD-10-CM | POA: Insufficient documentation

## 2014-03-31 DIAGNOSIS — Z792 Long term (current) use of antibiotics: Secondary | ICD-10-CM | POA: Insufficient documentation

## 2014-03-31 DIAGNOSIS — R079 Chest pain, unspecified: Secondary | ICD-10-CM | POA: Diagnosis not present

## 2014-03-31 DIAGNOSIS — F1110 Opioid abuse, uncomplicated: Secondary | ICD-10-CM | POA: Insufficient documentation

## 2014-03-31 DIAGNOSIS — Z8619 Personal history of other infectious and parasitic diseases: Secondary | ICD-10-CM | POA: Insufficient documentation

## 2014-03-31 DIAGNOSIS — R5383 Other fatigue: Secondary | ICD-10-CM | POA: Diagnosis not present

## 2014-03-31 DIAGNOSIS — Z79899 Other long term (current) drug therapy: Secondary | ICD-10-CM | POA: Insufficient documentation

## 2014-03-31 DIAGNOSIS — R0602 Shortness of breath: Secondary | ICD-10-CM | POA: Diagnosis not present

## 2014-03-31 LAB — CBC WITH DIFFERENTIAL/PLATELET
Basophils Absolute: 0.3 10*3/uL — ABNORMAL HIGH (ref 0.0–0.1)
Basophils Relative: 2 % — ABNORMAL HIGH (ref 0–1)
EOS ABS: 0.3 10*3/uL (ref 0.0–0.7)
Eosinophils Relative: 2 % (ref 0–5)
HEMATOCRIT: 20.6 % — AB (ref 39.0–52.0)
Hemoglobin: 6.5 g/dL — CL (ref 13.0–17.0)
LYMPHS PCT: 16 % (ref 12–46)
Lymphs Abs: 2.2 10*3/uL (ref 0.7–4.0)
MCH: 29.5 pg (ref 26.0–34.0)
MCHC: 31.6 g/dL (ref 30.0–36.0)
MCV: 93.6 fL (ref 78.0–100.0)
MONOS PCT: 10 % (ref 3–12)
Monocytes Absolute: 1.4 10*3/uL — ABNORMAL HIGH (ref 0.1–1.0)
NEUTROS ABS: 9.3 10*3/uL — AB (ref 1.7–7.7)
NEUTROS PCT: 70 % (ref 43–77)
PLATELETS: 805 10*3/uL — AB (ref 150–400)
RBC: 2.2 MIL/uL — ABNORMAL LOW (ref 4.22–5.81)
RDW: 21.4 % — AB (ref 11.5–15.5)
WBC: 13.5 10*3/uL — AB (ref 4.0–10.5)

## 2014-03-31 LAB — RAPID URINE DRUG SCREEN, HOSP PERFORMED
Amphetamines: NOT DETECTED
BENZODIAZEPINES: NOT DETECTED
Barbiturates: NOT DETECTED
COCAINE: NOT DETECTED
OPIATES: POSITIVE — AB
Tetrahydrocannabinol: POSITIVE — AB

## 2014-03-31 LAB — COMPREHENSIVE METABOLIC PANEL
ALT: 28 U/L (ref 0–53)
AST: 28 U/L (ref 0–37)
Albumin: 3.7 g/dL (ref 3.5–5.2)
Alkaline Phosphatase: 94 U/L (ref 39–117)
Anion gap: 4 — ABNORMAL LOW (ref 5–15)
BILIRUBIN TOTAL: 1.9 mg/dL — AB (ref 0.3–1.2)
BUN: 21 mg/dL (ref 6–23)
CO2: 21 mmol/L (ref 19–32)
CREATININE: 0.95 mg/dL (ref 0.50–1.35)
Calcium: 8.9 mg/dL (ref 8.4–10.5)
Chloride: 115 mmol/L — ABNORMAL HIGH (ref 96–112)
GLUCOSE: 107 mg/dL — AB (ref 70–99)
Potassium: 4.2 mmol/L (ref 3.5–5.1)
Sodium: 140 mmol/L (ref 135–145)
Total Protein: 7.5 g/dL (ref 6.0–8.3)

## 2014-03-31 LAB — URINE MICROSCOPIC-ADD ON

## 2014-03-31 LAB — URINALYSIS, ROUTINE W REFLEX MICROSCOPIC
Bilirubin Urine: NEGATIVE
Glucose, UA: NEGATIVE mg/dL
Hgb urine dipstick: NEGATIVE
KETONES UR: NEGATIVE mg/dL
Nitrite: NEGATIVE
Protein, ur: NEGATIVE mg/dL
SPECIFIC GRAVITY, URINE: 1.009 (ref 1.005–1.030)
Urobilinogen, UA: 0.2 mg/dL (ref 0.0–1.0)
pH: 6.5 (ref 5.0–8.0)

## 2014-03-31 LAB — RETICULOCYTES
RBC.: 2.2 MIL/uL — ABNORMAL LOW (ref 4.22–5.81)
Retic Count, Absolute: 44 10*3/uL (ref 19.0–186.0)
Retic Ct Pct: 2 % (ref 0.4–3.1)

## 2014-03-31 MED ORDER — HYDROMORPHONE HCL 2 MG/ML IJ SOLN
2.0000 mg | INTRAMUSCULAR | Status: AC
  Administered 2014-03-31 (×3): 2 mg via INTRAVENOUS
  Filled 2014-03-31 (×3): qty 1

## 2014-03-31 MED ORDER — SODIUM CHLORIDE 0.9 % IV BOLUS (SEPSIS)
1000.0000 mL | Freq: Once | INTRAVENOUS | Status: AC
  Administered 2014-03-31: 1000 mL via INTRAVENOUS

## 2014-03-31 MED ORDER — DIPHENHYDRAMINE HCL 25 MG PO CAPS
50.0000 mg | ORAL_CAPSULE | Freq: Once | ORAL | Status: AC
  Administered 2014-03-31: 50 mg via ORAL
  Filled 2014-03-31: qty 2

## 2014-03-31 NOTE — ED Provider Notes (Signed)
TIME SEEN: 8:35 AM  CHIEF COMPLAINT: Back pain  HPI: Pt is a 34 y.o. male with history of sickle cell, recurrent pulmonary emboli on Xarelto with history of medical noncompliance, hypertension who was recently admitted to the hospital in February for acute on chronic pulmonary embolus with new onset atrial fibrillation with RVR and then again one week later for acute chest syndrome who presents emergency room with lower back pain that started on Friday 3 days ago and left flank pain that started today. States this is typical of his sickle cell. Denies any chest pain or shortness of breath. No fever. No numbness, tingling or focal weakness. No bowel or bladder incontinence. States he is taking Dilaudid and MS Contin at home without relief. PCP is Dr. Zigmund Daniel.  ROS: See HPI Constitutional: no fever  Eyes: no drainage  ENT: no runny nose   Cardiovascular:  no chest pain  Resp: no SOB  GI: no vomiting GU: no dysuria Integumentary: no rash  Allergy: no hives  Musculoskeletal: no leg swelling  Neurological: no slurred speech ROS otherwise negative  PAST MEDICAL HISTORY/PAST SURGICAL HISTORY:  Past Medical History  Diagnosis Date  . Sickle cell anemia   . Blood transfusion   . Acute embolism and thrombosis of right internal jugular vein   . Hypokalemia   . Mood disorder   . History of pulmonary embolus (PE)   . Avascular necrosis   . Leukocytosis     Chronic  . Thrombocytosis     Chronic  . Hypertension   . History of Clostridium difficile infection   . Uses marijuana   . Chronic anticoagulation   . Functional asplenia   . Former smoker   . Second hand tobacco smoke exposure   . Alcohol consumption of one to four drinks per day   . Noncompliance with medication regimen   . Sickle-cell crisis with associated acute chest syndrome 05/13/2013  . Acute chest syndrome 06/18/2013  . Demand ischemia 01/02/2014    MEDICATIONS:  Prior to Admission medications   Medication Sig Start  Date End Date Taking? Authorizing Provider  amoxicillin-clavulanate (AUGMENTIN) 875-125 MG per tablet Take 1 tablet by mouth 2 (two) times daily. 03/24/14   Elwyn Reach, MD  Cholecalciferol (VITAMIN D) 2000 UNITS tablet Take 1 tablet (2,000 Units total) by mouth daily. 02/13/14   [name], MD  diltiazem (CARDIZEM CD) 120 MG 24 hr capsule Take 1 capsule (120 mg total) by mouth daily. 03/13/14   Costin Karlyne Greenspan, MD  folic acid (FOLVITE) 1 MG tablet Take 1 tablet (1 mg total) by mouth every morning. 10/08/13   [name], MD  HYDROmorphone (DILAUDID) 4 MG tablet Take 1 tablet (4 mg total) by mouth every 4 (four) hours as needed for severe pain. 03/13/14   Costin Karlyne Greenspan, MD  hydroxyurea (HYDREA) 500 MG capsule Take 3 capsules (1,500 mg total) by mouth daily. May take with food to minimize GI side effects. 10/30/13   [name], MD  lisinopril (PRINIVIL,ZESTRIL) 10 MG tablet Take 1 tablet (10 mg total) by mouth daily. 01/22/14   [name], MD  metoprolol tartrate (LOPRESSOR) 25 MG tablet Take 1 tablet (25 mg total) by mouth daily. 08/22/13   [name], MD  morphine (MS CONTIN) 30 MG 12 hr tablet Take 1 tablet (30 mg total) by mouth every 12 (twelve) hours. 02/26/14   [name], MD  potassium chloride SA (K-DUR,KLOR-CON) 20 MEQ tablet Take 1  tablet (20 mEq total) by mouth every morning. 09/25/13   [name], MD  rivaroxaban (XARELTO) 20 MG TABS tablet Take 1 tablet (20 mg total) by mouth every morning. 03/13/14   Costin Karlyne Greenspan, MD  zolpidem (AMBIEN) 10 MG tablet Take 1 tablet (10 mg total) by mouth at bedtime as needed for sleep. 10/28/13   Dorena Dew, FNP    ALLERGIES:  No Known Allergies  SOCIAL HISTORY:  History  Substance Use Topics  . Smoking status: Former Smoker -- 13 years    Quit date: 07/08/2010  . Smokeless tobacco: Never Used  . Alcohol Use: 1.2 oz/week    2 Shots of liquor per week    FAMILY HISTORY: Family  History  Problem Relation Age of Onset  . Sickle cell trait Mother   . Depression Mother   . Diabetes Mother   . Sickle cell trait Father   . Sickle cell trait Brother     EXAM: BP 122/70 mmHg  Pulse 105  Temp(Src) 99.1 F (37.3 C) (Oral)  Resp 18  SpO2 98% CONSTITUTIONAL: Alert and oriented and responds appropriately to questions. Appears uncomfortable but is not in distress, nontoxic, well-nourished HEAD: Normocephalic EYES: Conjunctivae clear, PERRL ENT: normal nose; no rhinorrhea; moist mucous membranes; pharynx without lesions noted NECK: Supple, no meningismus, no LAD  CARD: Regular and tachycardic; S1 and S2 appreciated; no murmurs, no clicks, no rubs, no gallops RESP: Normal chest excursion without splinting or tachypnea; breath sounds clear and equal bilaterally; no wheezes, no rhonchi, no rales, no hypoxia or wrist or distress, speaking full sentences ABD/GI: Normal bowel sounds; non-distended; soft, non-tender, no rebound, no guarding BACK:  The back appears normal and is non-tender to palpation, there is no CVA tenderness, no midline spinal tenderness or step-off or deformity, no lesions noted on the back EXT: Normal ROM in all joints; non-tender to palpation; no edema; normal capillary refill; no cyanosis    SKIN: Normal color for age and race; warm NEURO: Moves all extremities equally, sensation to light touch intact diffusely, cranial nerves II through XII intact PSYCH: The patient's mood and manner are appropriate. Grooming and personal hygiene are appropriate.  MEDICAL DECISION MAKING: Patient here with complaints of back pain that is typical of his sickle cell crisis. He is hemodynamically stable. Denies fever, chest pain, shortness of breath. No neurologic deficits. We'll obtain labs, urine, treat pain with Dilaudid, IV fluids.  ED PROGRESS: Patient's hemoglobin is 6.5. His hemoglobin is 7.0. Denies body stools or melena. Reticulocyte count however is not elevated.  His mild elevation of his total bilirubin. Small leukocytes in his urine but no other sign of infection. Reports only minimal pain relief with 2 rounds of 2 mg of IV Dilaudid. Have offered patient admission but he declines and states he would like to go to the sickle cell clinic. We'll page the on-call sickle cell physician.     11:00 AM  Spoke with Dr. Alben Deeds with sickle cell medicine. She states that the patient called the sickle cell clinic this morning to try to come there. She states the triage nurse stated the patient reported chest pain and shortness of breath and that is why they sent him to the emergency department. When I go back and reinterviewed the patient he is adamant that he has not had any chest pain, shortness of breath, cough or fever. He states he has felt fatigued when he walks.  Sickle cell physician is requesting a chest x-ray for  further evaluation and then we will contact him back. She is aware the patient's hemoglobin is 6.5 and states she may want to transfuse him given his recent acute chest syndrome. We'll discuss this again when I call her back after chest x-ray completed.  1:00 PM  Pt has received 6 mg of Dilaudid and reports his pain is still 7/10. Discussed with Dr. Alben Deeds he states that she is concerned that this is a large amount of pain medication and that patient will need admission for observation and pain control. Discussed with her that his chest x-ray is clear. She does not feel comfortable accepting the patient to the sickle cell clinic stating that he has the ability to become very sick very quickly. Discussed this with patient who is very upset. He refuses admission. Discuss with him that they would like to follow his hemoglobin as he may need a transfusion if his hemoglobin drops. He refuses. He is able to verbalize risks of leaving the hospital [place] back to me. States he will try to follow-up with Dr. Zigmund Daniel. States he has pain medication at  home. Have attempted multiple times to get patient to stay for pain control and monitoring of his hemoglobin but he refuses. I feel he has the capacity to make this decision.  [place], DO 03/31/14 1308

## 2014-03-31 NOTE — ED Notes (Signed)
Pt with crisis.  Back pain starting on Friday.  Left side pain starting today.

## 2014-03-31 NOTE — Discharge Instructions (Signed)
Your hemoglobin today was 6.5. Dr. Alben Deeds with the sickle cell clinic has recommended we admit sheet to the hospital for pain control and close monitoring of your hemoglobin and may need a blood transfusion. You've decided to leave the hospital [place].   Please follow-up with your primary care physician as soon as possible. If you feel worse or change your mind please return to the hospital.   Sickle Cell Anemia, Adult Sickle cell anemia is a condition in which red blood cells have an abnormal "sickle" shape. This abnormal shape shortens the cells' life span, which results in a lower than normal concentration of red blood cells in the blood. The sickle shape also causes the cells to clump together and block free blood flow through the blood vessels. As a result, the tissues and organs of the body do not receive enough oxygen. Sickle cell anemia causes organ damage and pain and increases the risk of infection. CAUSES  Sickle cell anemia is a genetic disorder. Those who receive two copies of the gene have the condition, and those who receive one copy have the trait. RISK FACTORS The sickle cell gene is most common in people whose families originated in [place]. Other areas of the globe where sickle cell trait occurs include the [place], [place] and [place], and the [place].  SIGNS AND SYMPTOMS  Pain, especially in the extremities, back, chest, or abdomen (common). The pain may start suddenly or may develop following an illness, especially if there is dehydration. Pain can also occur due to overexertion or exposure to extreme temperature changes.  Frequent severe bacterial infections, especially certain types of pneumonia and meningitis.  Pain and swelling in the hands and feet.  Decreased activity.   Loss of appetite.   Change in behavior.  Headaches.  Seizures.  Shortness of breath or difficulty breathing.  Vision changes.  Skin  ulcers. Those with the trait may not have symptoms or they may have mild symptoms.  DIAGNOSIS  Sickle cell anemia is diagnosed with blood tests that demonstrate the genetic trait. It is often diagnosed during the newborn period, due to mandatory testing nationwide. A variety of blood tests, X-rays, CT scans, MRI scans, ultrasounds, and lung function tests may also be done to monitor the condition. TREATMENT  Sickle cell anemia may be treated with:  Medicines. You may be given pain medicines, antibiotic medicines (to treat and prevent infections) or medicines to increase the production of certain types of hemoglobin.  Fluids.  Oxygen.  Blood transfusions. HOME CARE INSTRUCTIONS   Drink enough fluid to keep your urine clear or pale yellow. Increase your fluid intake in hot weather and during exercise.  Do not smoke. Smoking lowers oxygen levels in the blood.   Only take over-the-counter or prescription medicines for pain, fever, or discomfort as directed by your health care provider.  Take antibiotics as directed by your health care provider. Make sure you finish them it even if you start to feel better.   Take supplements as directed by your health care provider.   Consider wearing a medical alert bracelet. This tells anyone caring for you in an emergency of your condition.   When traveling, keep your medical information, health care provider's names, and the medicines you take with you at all times.   If you develop a fever, do not take medicines to reduce the fever right away. This could cover up a problem that is developing. Notify your health care provider.  Keep all follow-up appointments with your health care provider. Sickle cell anemia requires regular medical care. SEEK MEDICAL CARE IF: You have a fever. SEEK IMMEDIATE MEDICAL CARE IF:   You feel dizzy or faint.   You have new abdominal pain, especially on the left side near the stomach area.   You develop a  persistent, often uncomfortable and painful penile erection (priapism). If this is not treated immediately it will lead to impotence.   You have numbness your arms or legs or you have a hard time moving them.   You have a hard time with speech.   You have a fever or persistent symptoms for more than 2-3 days.   You have a fever and your symptoms suddenly get worse.   You have signs or symptoms of infection. These include:   Chills.   Abnormal tiredness (lethargy).   Irritability.   Poor eating.   Vomiting.   You develop pain that is not helped with medicine.   You develop shortness of breath.  You have pain in your chest.   You are coughing up pus-like or bloody sputum.   You develop a stiff neck.  Your feet or hands swell or have pain.  Your abdomen appears bloated.  You develop joint pain. MAKE SURE YOU:  Understand these instructions. Document Released: 04/27/2005 Document Revised: 06/03/2013 Document Reviewed: 08/29/2012 [facility] Patient Information 2015 [place], [place]. This information is not intended to replace advice given to you by your health care provider. Make sure you discuss any questions you have with your health care provider.   Discharge Against Medical Advice I am signing this paper to show that I am leaving this hospital or health care center of my own free will. It is done against all medical advice. In doing so, I am releasing this hospital or health care center and the attending physicians from any and all claims that I may want to make. I understand that further care has been recommended. My condition may worsen. This could cause me further bodily injury, illness, or even death. I do know that the medical staff has fully explained to me the risk that I am taking in leaving against medical advice. Document Released: 01/17/2005 Document Revised: 04/11/2011 Document Reviewed: 07/04/2006 [facility] Patient Information 2015 [place],  [place]. This information is not intended to replace advice given to you by your health care provider. Make sure you discuss any questions you have with your health care provider.

## 2014-03-31 NOTE — Telephone Encounter (Signed)
Advised patient that I spoke with Dr. Alben Deeds, and he needs to be evaluated in ED.  Patient verbalizes understanding

## 2014-03-31 NOTE — Telephone Encounter (Signed)
Patient C/O pain to left side and back that he rates 7/10 on pain scale.  Patient denies chest pain , but does get tired and short of breath when he walks.  Patient states he has taken ms contin without improvement.  No N/V/D or priapism.  I advised I would notify the physician and give him a call back.  Patient verbalizes understanding.

## 2014-04-01 ENCOUNTER — Other Ambulatory Visit: Payer: Self-pay | Admitting: Internal Medicine

## 2014-04-01 ENCOUNTER — Telehealth: Payer: Self-pay | Admitting: Internal Medicine

## 2014-04-01 DIAGNOSIS — G4700 Insomnia, unspecified: Secondary | ICD-10-CM

## 2014-04-01 MED ORDER — ZOLPIDEM TARTRATE 10 MG PO TABS: 10.0000 mg | ORAL_TABLET | Freq: Every evening | ORAL | Status: DC | PRN

## 2014-04-01 NOTE — Telephone Encounter (Signed)
Prescription refilled for Ambien 10 mg #30 tabs with  3 refills.

## 2014-04-01 NOTE — Telephone Encounter (Signed)
REFILL REQUEST FOR AMBIEN 10MG . LOV 02/13/2014 Please advise. Thanks!

## 2014-04-03 ENCOUNTER — Other Ambulatory Visit: Payer: Self-pay

## 2014-04-03 ENCOUNTER — Telehealth: Payer: Self-pay

## 2014-04-03 ENCOUNTER — Telehealth (HOSPITAL_COMMUNITY): Payer: Self-pay | Admitting: *Deleted

## 2014-04-03 ENCOUNTER — Non-Acute Institutional Stay (HOSPITAL_COMMUNITY)
Admission: AD | Admit: 2014-04-03 | Discharge: 2014-04-03 | Disposition: A | Discharge status: Home / Self Care | Payer: Medicare Other | Attending: Internal Medicine | Admitting: Internal Medicine

## 2014-04-03 ENCOUNTER — Encounter (HOSPITAL_COMMUNITY): Payer: Self-pay

## 2014-04-03 ENCOUNTER — Telehealth: Payer: Self-pay | Admitting: Internal Medicine

## 2014-04-03 DIAGNOSIS — F39 Unspecified mood [affective] disorder: Secondary | ICD-10-CM | POA: Diagnosis not present

## 2014-04-03 DIAGNOSIS — F1210 Cannabis abuse, uncomplicated: Secondary | ICD-10-CM | POA: Diagnosis not present

## 2014-04-03 DIAGNOSIS — Z86711 Personal history of pulmonary embolism: Secondary | ICD-10-CM | POA: Insufficient documentation

## 2014-04-03 DIAGNOSIS — D5700 Hb-SS disease with crisis, unspecified: Secondary | ICD-10-CM | POA: Diagnosis present

## 2014-04-03 DIAGNOSIS — I10 Essential (primary) hypertension: Secondary | ICD-10-CM | POA: Insufficient documentation

## 2014-04-03 DIAGNOSIS — Z79899 Other long term (current) drug therapy: Secondary | ICD-10-CM | POA: Diagnosis not present

## 2014-04-03 DIAGNOSIS — Z87891 Personal history of nicotine dependence: Secondary | ICD-10-CM | POA: Insufficient documentation

## 2014-04-03 DIAGNOSIS — E876 Hypokalemia: Secondary | ICD-10-CM | POA: Diagnosis not present

## 2014-04-03 LAB — RETICULOCYTES
RBC.: 2.03 MIL/uL — AB (ref 4.22–5.81)
Retic Count, Absolute: 168.5 10*3/uL (ref 19.0–186.0)
Retic Ct Pct: 8.3 % — ABNORMAL HIGH (ref 0.4–3.1)

## 2014-04-03 LAB — COMPREHENSIVE METABOLIC PANEL
ALT: 31 U/L (ref 0–53)
ANION GAP: 3 — AB (ref 5–15)
AST: 29 U/L (ref 0–37)
Albumin: 3.6 g/dL (ref 3.5–5.2)
Alkaline Phosphatase: 63 U/L (ref 39–117)
Anion gap: 4 — ABNORMAL LOW (ref 5–15)
BUN: 9 mg/dL (ref 6–23)
CO2: 26 mmol/L (ref 19–32)
Calcium: 8.8 mg/dL (ref 8.4–10.5)
Chloride: 108 mmol/L (ref 96–112)
Creatinine, Ser: 0.63 mg/dL (ref 0.50–1.35)
GFR calc Af Amer: >90 mL/min (ref >90)
GFR calc non Af Amer: >90 mL/min (ref >90)
GLUCOSE: 100 mg/dL — AB (ref 70–99)
POTASSIUM: 3.9 mmol/L (ref 3.5–5.1)
SODIUM: 138 mmol/L (ref 135–145)
Total Bilirubin: 1.8 mg/dL — ABNORMAL HIGH (ref 0.3–1.2)
Total Protein: 7.1 g/dL (ref 6.0–8.3)

## 2014-04-03 LAB — CBC WITH DIFFERENTIAL/PLATELET
BASOS ABS: 0.1 10*3/uL (ref 0.0–0.1)
Basophils Relative: 1 % (ref 0–1)
EOS PCT: 3 % (ref 0–5)
Eosinophils Absolute: 0.4 10*3/uL (ref 0.0–0.7)
HCT: 19.1 % — ABNORMAL LOW (ref 39.0–52.0)
Hemoglobin: 6.3 g/dL — CL (ref 13.0–17.0)
Lymphocytes Relative: 17 % (ref 12–46)
Lymphs Abs: 2.1 10*3/uL (ref 0.7–4.0)
MCH: 31 pg (ref 26.0–34.0)
MCHC: 33 g/dL (ref 30.0–36.0)
MCV: 94.1 fL (ref 78.0–100.0)
MONOS PCT: 8 % (ref 3–12)
Monocytes Absolute: 1 10*3/uL (ref 0.1–1.0)
NEUTROS ABS: 8.6 10*3/uL — AB (ref 1.7–7.7)
Neutrophils Relative %: 71 % (ref 43–77)
Platelets: 643 10*3/uL — ABNORMAL HIGH (ref 150–400)
RBC: 2.03 MIL/uL — AB (ref 4.22–5.81)
RDW: 23.6 % — ABNORMAL HIGH (ref 11.5–15.5)
WBC: 12.2 10*3/uL — AB (ref 4.0–10.5)
nRBC: 4 /100 WBC — ABNORMAL HIGH

## 2014-04-03 LAB — LACTATE DEHYDROGENASE: LDH: 269 U/L — AB (ref 94–250)

## 2014-04-03 MED ORDER — SODIUM CHLORIDE 0.9 % IJ SOLN
10.0000 mL | INTRAMUSCULAR | Status: AC | PRN
  Administered 2014-04-03: 10 mL

## 2014-04-03 MED ORDER — HYDROMORPHONE HCL 4 MG PO TABS: 4.0000 mg | ORAL_TABLET | ORAL | Status: DC | PRN

## 2014-04-03 MED ORDER — SODIUM CHLORIDE 0.9 % IJ SOLN: 9.0000 mL | INTRAMUSCULAR | Status: DC | PRN

## 2014-04-03 MED ORDER — HEPARIN SOD (PORK) LOCK FLUSH 100 UNIT/ML IV SOLN
500.0000 [IU] | INTRAVENOUS | Status: AC | PRN
  Administered 2014-04-03: 500 [IU]
  Filled 2014-04-03: qty 5

## 2014-04-03 MED ORDER — POLYETHYLENE GLYCOL 3350 17 G PO PACK: 17.0000 g | PACK | Freq: Every day | ORAL | Status: DC | PRN

## 2014-04-03 MED ORDER — SENNOSIDES-DOCUSATE SODIUM 8.6-50 MG PO TABS: 1.0000 | ORAL_TABLET | Freq: Two times a day (BID) | ORAL | Status: DC

## 2014-04-03 MED ORDER — DIPHENHYDRAMINE HCL 25 MG PO CAPS
25.0000 mg | ORAL_CAPSULE | ORAL | Status: DC | PRN
  Administered 2014-04-03: 25 mg via ORAL
  Filled 2014-04-03: qty 1

## 2014-04-03 MED ORDER — NALOXONE HCL 0.4 MG/ML IJ SOLN: 0.4000 mg | INTRAMUSCULAR | Status: DC | PRN

## 2014-04-03 MED ORDER — DEXTROSE-NACL 5-0.45 % IV SOLN
INTRAVENOUS | Status: DC
  Administered 2014-04-03: 11:00:00 via INTRAVENOUS

## 2014-04-03 MED ORDER — KETOROLAC TROMETHAMINE 30 MG/ML IJ SOLN
30.0000 mg | Freq: Four times a day (QID) | INTRAMUSCULAR | Status: DC
  Administered 2014-04-03: 30 mg via INTRAVENOUS
  Filled 2014-04-03: qty 1

## 2014-04-03 MED ORDER — HYDROMORPHONE 2 MG/ML HIGH CONCENTRATION IV PCA SOLN
INTRAVENOUS | Status: DC
  Administered 2014-04-03: 13 mg via INTRAVENOUS
  Administered 2014-04-03: 1 mg via INTRAVENOUS
  Administered 2014-04-03: 11:00:00 via INTRAVENOUS
  Filled 2014-04-03: qty 25

## 2014-04-03 MED ORDER — FOLIC ACID 1 MG PO TABS
1.0000 mg | ORAL_TABLET | Freq: Every day | ORAL | Status: DC
Start: 2014-04-03 — End: 2014-04-03

## 2014-04-03 MED ORDER — DIPHENHYDRAMINE HCL 50 MG/ML IJ SOLN
25.0000 mg | INTRAMUSCULAR | Status: DC | PRN
  Filled 2014-04-03: qty 0.5

## 2014-04-03 NOTE — Progress Notes (Signed)
Received critical lab results; samples recollected and sent to lab; will continue to monitor

## 2014-04-03 NOTE — Progress Notes (Signed)
CRITICAL VALUE ALERT  Critical value received: Hgb 4.6  Date of notification: 04/03/14  Time of notification: 1200  Critical value read back:Yes.   Nurse who received alert: I.Ahmon Tosi  MD notified (1st page): Dr. Jonelle Sidle  Time of first page: 1204

## 2014-04-03 NOTE — Progress Notes (Signed)
Patient ID: [name], male   DOB: Jan 13, 1981, 34 y.o.   MRN: [id number] Dr.[name] Diagnosis: Sickle Cell Anemia Pt arrived for evaluation and treatment with report of 8/10 pain in back and 8/10 pain in rib cage. Patient received fuids and pain medicine. No nausea no CP reported. VS stayed stable through out procedure. Tolerated well. Discharged to home after pt stated that he will be able to manage pain at home.  Christiana Gurevich, Eustaquio Maize

## 2014-04-03 NOTE — Telephone Encounter (Signed)
Close encounter 

## 2014-04-03 NOTE — Telephone Encounter (Signed)
Refill request for Dilaudid 4mg . LOV 02/13/2014. Please advise. Thanks!

## 2014-04-03 NOTE — Progress Notes (Signed)
CRITICAL VALUE ALERT  Critical value received: K+ 2.7, Ca+ 6.3  Date of notification:  04/03/14  Time of notification:  1220  Critical value read back:Yes.    Nurse who received alert:  B. Tamala Julian  MD notified (1st page):  Dr. Jonelle Sidle  Time of first page:  1222  MD notified (2nd page):  Time of second page:  Responding MD:   Time MD responded:

## 2014-04-03 NOTE — Telephone Encounter (Signed)
Pt called in to report back and side pain 8/10. Denied N/V/D, denied CP, ShOB, priapism, denied fever. Dr.[name] paged and notified. Pt is OK to come in the [facility] for evaluation and treatment. Patient called back and invited to come in. Lucien Mons, RN

## 2014-04-03 NOTE — Telephone Encounter (Signed)
Pt requests prescription refill for Dilaudid 4 mg tablets

## 2014-04-03 NOTE — Telephone Encounter (Signed)
Prescription written for Dilaudid 4 mg #90 tabs. [facility] reviewed and no inconsistencies noted.

## 2014-04-03 NOTE — Progress Notes (Signed)
Pt labs redrawn. Dr.[name] notified and aware.

## 2014-04-03 NOTE — Telephone Encounter (Signed)
Pt is instructed to come in the day center. States he will arrive in 30-45 min. Carmita Boom, Eustaquio Maize

## 2014-04-03 NOTE — Telephone Encounter (Signed)
Pt called to let know that Dr.[name] signed a prescription for dilaudid and it is waiting for him at the front office. Pt states he understands. Nona Gracey, Eustaquio Maize

## 2014-04-03 NOTE — H&P (Signed)
[name] is an 34 y.o. male.   Chief Complaint: Pain in rib cage and arms HPI: 34 yo with history of Sickle cell disease here with pain in his rib cage and back for 2 days not relieved by his home medications. No Chest pain, no shortness of breath, no NVD. Pain is worsened by movement and not relieved by anything. Has no radiation and is consistent with his previous sickle cell crisis.  Past Medical History  Diagnosis Date  . Sickle cell anemia   . Blood transfusion   . Acute embolism and thrombosis of right internal jugular vein   . Hypokalemia   . Mood disorder   . History of pulmonary embolus (PE)   . Avascular necrosis   . Leukocytosis     Chronic  . Thrombocytosis     Chronic  . Hypertension   . History of Clostridium difficile infection   . Uses marijuana   . Chronic anticoagulation   . Functional asplenia   . Former smoker   . Second hand tobacco smoke exposure   . Alcohol consumption of one to four drinks per day   . Noncompliance with medication regimen   . Sickle-cell crisis with associated acute chest syndrome 05/13/2013  . Acute chest syndrome 06/18/2013  . Demand ischemia 01/02/2014    Past Surgical History  Procedure Laterality Date  . Right hip replacement      08/2006  . Cholecystectomy      01/2008  . Porta cath placement    . Porta cath removal    . Umbilical hernia repair      01/2008  . Excision of left periauricular cyst      10/2009  . Excision of right ear lobe cyst with primary closur      11/2007  . Portacath placement  01/05/2012    Procedure: INSERTION PORT-A-CATH;  Surgeon: Odis Hollingshead, MD;  Location: [place];  Service: General;  Laterality: N/A;  ultrasound guiced port a cath insertion with fluoroscopy    Family History  Problem Relation Age of Onset  . Sickle cell trait Mother   . Depression Mother   . Diabetes Mother   . Sickle cell trait Father   . Sickle cell trait Brother    Social History:  reports that he quit  smoking about 3 years ago. He has never used smokeless tobacco. He reports that he drinks about 1.2 oz of alcohol per week. He reports that he uses illicit drugs (Marijuana) about twice per week.  Allergies: No Known Allergies  Medications Prior to Admission  Medication Sig Dispense Refill  . amoxicillin-clavulanate (AUGMENTIN) 875-125 MG per tablet Take 1 tablet by mouth 2 (two) times daily. 10 tablet 0  . Cholecalciferol (VITAMIN D) 2000 UNITS tablet Take 1 tablet (2,000 Units total) by mouth daily. 90 tablet 3  . diltiazem (CARDIZEM CD) 120 MG 24 hr capsule Take 1 capsule (120 mg total) by mouth daily. 30 capsule 1  . folic acid (FOLVITE) 1 MG tablet Take 1 tablet (1 mg total) by mouth every morning. 30 tablet 11  . HYDROmorphone (DILAUDID) 4 MG tablet Take 1 tablet (4 mg total) by mouth every 4 (four) hours as needed for severe pain. 20 tablet 0  . hydroxyurea (HYDREA) 500 MG capsule Take 3 capsules (1,500 mg total) by mouth daily. May take with food to minimize GI side effects. 60 capsule 3  . lisinopril (PRINIVIL,ZESTRIL) 10 MG tablet Take 1 tablet (10 mg total) by  mouth daily. 30 tablet 1  . metoprolol tartrate (LOPRESSOR) 25 MG tablet Take 1 tablet (25 mg total) by mouth daily. 30 tablet 6  . morphine (MS CONTIN) 30 MG 12 hr tablet Take 1 tablet (30 mg total) by mouth every 12 (twelve) hours. 60 tablet 0  . potassium chloride SA (K-DUR,KLOR-CON) 20 MEQ tablet Take 1 tablet (20 mEq total) by mouth every morning. 30 tablet 3  . rivaroxaban (XARELTO) 20 MG TABS tablet Take 1 tablet (20 mg total) by mouth every morning. 30 tablet 2  . zolpidem (AMBIEN) 10 MG tablet Take 1 tablet (10 mg total) by mouth at bedtime as needed for sleep. 30 tablet 3    No results found for this or any previous visit (from the past 48 hour(s)). No results found.  Review of Systems  Constitutional: Negative.   HENT: Negative.   Eyes: Negative.   Cardiovascular: Negative.   Gastrointestinal: Negative.    Genitourinary: Negative.   Musculoskeletal: Positive for myalgias and back pain. Negative for joint pain and falls.  Skin: Negative.   Neurological: Negative.   Endo/Heme/Allergies: Negative.     Blood pressure 119/70, pulse 72, temperature 98.2 F (36.8 C), temperature source Oral, resp. rate 18, weight 73.936 kg (163 lb), SpO2 99 %. Physical Exam  Constitutional: He is oriented to person, place, and time. He appears well-developed and well-nourished.  HENT:  Head: Normocephalic and atraumatic.  Right Ear: External ear normal.  Left Ear: External ear normal.  Mouth/Throat: Oropharynx is clear and moist.  Eyes: Conjunctivae and EOM are normal. Pupils are equal, round, and reactive to light.  Neck: Normal range of motion. Neck supple.  Cardiovascular: Normal rate, regular rhythm, normal heart sounds and intact distal pulses.   Respiratory: Effort normal and breath sounds normal.  GI: Soft. Bowel sounds are normal.  Musculoskeletal: Normal range of motion. He exhibits no edema or tenderness.  Neurological: He is alert and oriented to person, place, and time. He has normal reflexes.  Skin: Skin is warm and dry.  Psychiatric: He has a normal mood and affect. His behavior is normal.     Assessment/Plan A 34 yo here with sickle cell painful crisis.  #1 Sickle cell Painful crisis: Will admit to the day hospital, start IV Dilaudid PCA, Toradol, IVF and IV hydration. Check labs and re-evaluate at end of the day.  #2 Sickle Cell Anemia: Follow H/H closely.  #3 Hx of coagulopathy: Continue Xarelto.  [name],[name] 04/03/2014, 10:55 AM

## 2014-04-03 NOTE — Discharge Summary (Signed)
Physician Discharge Summary  Patient ID: [name] MRN: [id number] DOB/AGE: 1980-06-04 34 y.o.  Admit date: 04/03/2014 Discharge date: 04/03/2014  Admission Diagnoses:  Discharge Diagnoses:  Active Problems:   Hb-SS disease with crisis   Sickle cell disease with crisis   Discharged Condition: good  Hospital Course: Patient admitted with sickle cell painful crisis. Received IV Dilaudid PCA, Topradol and IVF. Has felt better and pain went from 9/10 to 4/10. He was discharged home on his home medications to follow up with Dr Zigmund Daniel as scheduled.  Consults: None  Significant Diagnostic Studies: labs: CBC, CMP and Retic count. All within normal limits.  Treatments: IV hydration and analgesia: Dilaudid  Discharge Exam: Blood pressure 111/65, pulse 81, temperature 98.3 F (36.8 C), temperature source Oral, resp. rate 19, weight 73.936 kg (163 lb), SpO2 95 %. General appearance: alert, cooperative and no distress Eyes: conjunctivae/corneas clear. PERRL, EOM's intact. Fundi benign. Neck: no adenopathy, no carotid bruit, no JVD, supple, symmetrical, trachea midline and thyroid not enlarged, symmetric, no tenderness/mass/nodules Back: symmetric, no curvature. ROM normal. No CVA tenderness. Resp: clear to auscultation bilaterally Chest wall: no tenderness Cardio: regular rate and rhythm, S1, S2 normal, no murmur, click, rub or gallop GI: soft, non-tender; bowel sounds normal; no masses,  no organomegaly Extremities: extremities normal, atraumatic, no cyanosis or edema Pulses: 2+ and symmetric Skin: Skin color, texture, turgor normal. No rashes or lesions  Disposition: 81-Discharged to home/self-care with a planned acute care hospital inpt readmission     Medication List    TAKE these medications        amoxicillin-clavulanate 875-125 MG per tablet  Commonly known as:  AUGMENTIN  Take 1 tablet by mouth 2 (two) times daily.     diltiazem 120 MG 24 hr capsule  Commonly  known as:  CARDIZEM CD  Take 1 capsule (120 mg total) by mouth daily.     folic acid 1 MG tablet  Commonly known as:  FOLVITE  Take 1 tablet (1 mg total) by mouth every morning.     HYDROmorphone 4 MG tablet  Commonly known as:  DILAUDID  Take 1 tablet (4 mg total) by mouth every 4 (four) hours as needed for severe pain.     hydroxyurea 500 MG capsule  Commonly known as:  HYDREA  Take 3 capsules (1,500 mg total) by mouth daily. May take with food to minimize GI side effects.     lisinopril 10 MG tablet  Commonly known as:  PRINIVIL,ZESTRIL  Take 1 tablet (10 mg total) by mouth daily.     metoprolol tartrate 25 MG tablet  Commonly known as:  LOPRESSOR  Take 1 tablet (25 mg total) by mouth daily.     morphine 30 MG 12 hr tablet  Commonly known as:  MS CONTIN  Take 1 tablet (30 mg total) by mouth every 12 (twelve) hours.     potassium chloride SA 20 MEQ tablet  Commonly known as:  K-DUR,KLOR-CON  Take 1 tablet (20 mEq total) by mouth every morning.     rivaroxaban 20 MG Tabs tablet  Commonly known as:  XARELTO  Take 1 tablet (20 mg total) by mouth every morning.     Vitamin D 2000 UNITS tablet  Take 1 tablet (2,000 Units total) by mouth daily.     zolpidem 10 MG tablet  Commonly known as:  AMBIEN  Take 1 tablet (10 mg total) by mouth at bedtime as needed for sleep.  SignedBarbette Merino 04/03/2014, 4:18 PM

## 2014-04-06 ENCOUNTER — Encounter (HOSPITAL_COMMUNITY): Payer: Self-pay | Admitting: *Deleted

## 2014-04-06 ENCOUNTER — Emergency Department (HOSPITAL_COMMUNITY): Payer: Medicare Other

## 2014-04-06 ENCOUNTER — Emergency Department (HOSPITAL_COMMUNITY)
Admission: EM | Admit: 2014-04-06 | Discharge: 2014-04-06 | Disposition: A | Discharge status: Home / Self Care | Payer: Medicare Other | Attending: Emergency Medicine | Admitting: Emergency Medicine

## 2014-04-06 DIAGNOSIS — R0789 Other chest pain: Secondary | ICD-10-CM | POA: Diagnosis not present

## 2014-04-06 DIAGNOSIS — R079 Chest pain, unspecified: Secondary | ICD-10-CM | POA: Diagnosis present

## 2014-04-06 DIAGNOSIS — Z86711 Personal history of pulmonary embolism: Secondary | ICD-10-CM | POA: Diagnosis not present

## 2014-04-06 DIAGNOSIS — I10 Essential (primary) hypertension: Secondary | ICD-10-CM | POA: Diagnosis not present

## 2014-04-06 DIAGNOSIS — Z792 Long term (current) use of antibiotics: Secondary | ICD-10-CM | POA: Insufficient documentation

## 2014-04-06 DIAGNOSIS — Z79899 Other long term (current) drug therapy: Secondary | ICD-10-CM | POA: Insufficient documentation

## 2014-04-06 DIAGNOSIS — Z8619 Personal history of other infectious and parasitic diseases: Secondary | ICD-10-CM | POA: Insufficient documentation

## 2014-04-06 DIAGNOSIS — Z87891 Personal history of nicotine dependence: Secondary | ICD-10-CM | POA: Diagnosis not present

## 2014-04-06 DIAGNOSIS — D5700 Hb-SS disease with crisis, unspecified: Secondary | ICD-10-CM | POA: Diagnosis not present

## 2014-04-06 DIAGNOSIS — Z7901 Long term (current) use of anticoagulants: Secondary | ICD-10-CM | POA: Insufficient documentation

## 2014-04-06 DIAGNOSIS — Z86718 Personal history of other venous thrombosis and embolism: Secondary | ICD-10-CM | POA: Insufficient documentation

## 2014-04-06 DIAGNOSIS — Q8901 Asplenia (congenital): Secondary | ICD-10-CM | POA: Diagnosis not present

## 2014-04-06 LAB — BASIC METABOLIC PANEL
ANION GAP: 4 — AB (ref 5–15)
BUN: 8 mg/dL (ref 6–23)
CO2: 24 mmol/L (ref 19–32)
Calcium: 8.7 mg/dL (ref 8.4–10.5)
Chloride: 111 mmol/L (ref 96–112)
Creatinine, Ser: 0.65 mg/dL (ref 0.50–1.35)
GFR calc Af Amer: >90 mL/min (ref >90)
Glucose, Bld: 100 mg/dL — ABNORMAL HIGH (ref 70–99)
Potassium: 3.7 mmol/L (ref 3.5–5.1)
SODIUM: 139 mmol/L (ref 135–145)

## 2014-04-06 LAB — RETICULOCYTES
RBC.: 2.13 MIL/uL — AB (ref 4.22–5.81)
Retic Count, Absolute: 236.4 10*3/uL — ABNORMAL HIGH (ref 19.0–186.0)
Retic Ct Pct: 11.1 % — ABNORMAL HIGH (ref 0.4–3.1)

## 2014-04-06 LAB — CBC WITH DIFFERENTIAL/PLATELET
Basophils Absolute: 0.1 10*3/uL (ref 0.0–0.1)
Basophils Relative: 1 % (ref 0–1)
Eosinophils Absolute: 0.4 10*3/uL (ref 0.0–0.7)
Eosinophils Relative: 3 % (ref 0–5)
HEMATOCRIT: 20.2 % — AB (ref 39.0–52.0)
HEMOGLOBIN: 6.6 g/dL — AB (ref 13.0–17.0)
Lymphocytes Relative: 21 % (ref 12–46)
Lymphs Abs: 2.9 10*3/uL (ref 0.7–4.0)
MCH: 31 pg (ref 26.0–34.0)
MCHC: 32.7 g/dL (ref 30.0–36.0)
MCV: 94.8 fL (ref 78.0–100.0)
Monocytes Absolute: 1.2 10*3/uL — ABNORMAL HIGH (ref 0.1–1.0)
Monocytes Relative: 9 % (ref 3–12)
Neutro Abs: 9 10*3/uL — ABNORMAL HIGH (ref 1.7–7.7)
Neutrophils Relative %: 66 % (ref 43–77)
Platelets: 587 10*3/uL — ABNORMAL HIGH (ref 150–400)
RBC: 2.13 MIL/uL — AB (ref 4.22–5.81)
RDW: 24.4 % — ABNORMAL HIGH (ref 11.5–15.5)
WBC: 13.6 10*3/uL — AB (ref 4.0–10.5)

## 2014-04-06 LAB — PROTIME-INR
INR: 1.35 (ref 0.00–1.49)
Prothrombin Time: 16.8 seconds — ABNORMAL HIGH (ref 11.6–15.2)

## 2014-04-06 MED ORDER — SODIUM CHLORIDE 0.9 % IV SOLN
Freq: Once | INTRAVENOUS | Status: AC
  Administered 2014-04-06: 09:00:00 via INTRAVENOUS

## 2014-04-06 MED ORDER — HEPARIN SOD (PORK) LOCK FLUSH 100 UNIT/ML IV SOLN
500.0000 [IU] | Freq: Once | INTRAVENOUS | Status: AC
  Administered 2014-04-06: 500 [IU]
  Filled 2014-04-06: qty 5

## 2014-04-06 MED ORDER — DIPHENHYDRAMINE HCL 50 MG/ML IJ SOLN
25.0000 mg | Freq: Once | INTRAMUSCULAR | Status: AC
  Administered 2014-04-06: 25 mg via INTRAVENOUS
  Filled 2014-04-06: qty 1

## 2014-04-06 MED ORDER — SODIUM CHLORIDE 0.9 % IV BOLUS (SEPSIS)
1000.0000 mL | Freq: Once | INTRAVENOUS | Status: AC
  Administered 2014-04-06: 1000 mL via INTRAVENOUS

## 2014-04-06 MED ORDER — HYDROMORPHONE HCL 1 MG/ML IJ SOLN: 1.0000 mg | INTRAMUSCULAR | Status: DC | PRN

## 2014-04-06 MED ORDER — HYDROMORPHONE HCL 2 MG/ML IJ SOLN
2.0000 mg | INTRAMUSCULAR | Status: DC | PRN
  Administered 2014-04-06 (×2): 2 mg via INTRAVENOUS
  Filled 2014-04-06 (×2): qty 1

## 2014-04-06 MED ORDER — ONDANSETRON HCL 4 MG/2ML IJ SOLN
4.0000 mg | Freq: Once | INTRAMUSCULAR | Status: AC
  Administered 2014-04-06: 4 mg via INTRAVENOUS
  Filled 2014-04-06: qty 2

## 2014-04-06 NOTE — ED Notes (Signed)
Patient transported to X-ray 

## 2014-04-06 NOTE — Discharge Instructions (Signed)
Fill your prescription for, and take your regular chronic pain medications prescribed by her primary care physician.  Recheck at the sickle cell clinic.

## 2014-04-06 NOTE — ED Notes (Signed)
Pt reports sickle cell pain crisis. Intermittent pain since Monday in sides, ribs, and back. Pain 8/10. Denies SOB. Reports he vomiting 1x this morning.

## 2014-04-06 NOTE — ED Notes (Signed)
[place] and [place] given to patient, per Therapist, sports.

## 2014-04-06 NOTE — ED Provider Notes (Signed)
CSN: [id number]     Arrival date & time 04/06/14  2229 History   First MD Initiated Contact with Patient 04/06/14 [phone]     Chief Complaint  Patient presents with  . Sickle Cell Pain Crisis      HPI  [name] presents with a complaint of left lateral chest pain. States he's had symptoms for 3 days. Not presently, states that he ran out of his pain medications 4 days ago. Has a prescriptions at home, states "I need to go get them".  Before meals does still have his Eliquis, and is compliant with it. Denies shortness of breath or fever. No atypical features today. Similar area to his multiple past exacerbations of sickle cell disease.  Past Medical History  Diagnosis Date  . Sickle cell anemia   . Blood transfusion   . Acute embolism and thrombosis of right internal jugular vein   . Hypokalemia   . Mood disorder   . History of pulmonary embolus (PE)   . Avascular necrosis   . Leukocytosis     Chronic  . Thrombocytosis     Chronic  . Hypertension   . History of Clostridium difficile infection   . Uses marijuana   . Chronic anticoagulation   . Functional asplenia   . Former smoker   . Second hand tobacco smoke exposure   . Alcohol consumption of one to four drinks per day   . Noncompliance with medication regimen   . Sickle-cell crisis with associated acute chest syndrome 05/13/2013  . Acute chest syndrome 06/18/2013  . Demand ischemia 01/02/2014   Past Surgical History  Procedure Laterality Date  . Right hip replacement      08/2006  . Cholecystectomy      01/2008  . Porta cath placement    . Porta cath removal    . Umbilical hernia repair      01/2008  . Excision of left periauricular cyst      10/2009  . Excision of right ear lobe cyst with primary closur      11/2007  . Portacath placement  01/05/2012    Procedure: INSERTION PORT-A-CATH;  Surgeon: Odis Hollingshead, MD;  Location: [place];  Service: General;  Laterality: N/A;  ultrasound guiced port a cath insertion with  fluoroscopy   Family History  Problem Relation Age of Onset  . Sickle cell trait Mother   . Depression Mother   . Diabetes Mother   . Sickle cell trait Father   . Sickle cell trait Brother    History  Substance Use Topics  . Smoking status: Former Smoker -- 13 years    Quit date: 07/08/2010  . Smokeless tobacco: Never Used  . Alcohol Use: 1.2 oz/week    2 Shots of liquor per week    Review of Systems  Constitutional: Negative for fever, chills, diaphoresis, appetite change and fatigue.  HENT: Negative for mouth sores, sore throat and trouble swallowing.   Eyes: Negative for visual disturbance.  Respiratory: Negative for cough, chest tightness, shortness of breath and wheezing.   Cardiovascular: Positive for chest pain.  Gastrointestinal: Negative for nausea, vomiting, abdominal pain, diarrhea and abdominal distention.  Endocrine: Negative for polydipsia, polyphagia and polyuria.  Genitourinary: Negative for dysuria, frequency and hematuria.  Musculoskeletal: Negative for gait problem.  Skin: Negative for color change, pallor and rash.  Neurological: Negative for dizziness, syncope, light-headedness and headaches.  Hematological: Does not bruise/bleed easily.  Psychiatric/Behavioral: Negative for behavioral problems and confusion.  Allergies  Review of patient's allergies indicates no known allergies.  Home Medications   Prior to Admission medications   Medication Sig Start Date End Date Taking? Authorizing Provider  amoxicillin-clavulanate (AUGMENTIN) 875-125 MG per tablet Take 1 tablet by mouth 2 (two) times daily. 03/24/14  Yes Elwyn Reach, MD  Cholecalciferol (VITAMIN D) 2000 UNITS tablet Take 1 tablet (2,000 Units total) by mouth daily. 02/13/14  Yes [name], MD  diltiazem (CARDIZEM CD) 120 MG 24 hr capsule Take 1 capsule (120 mg total) by mouth daily. 03/13/14  Yes Costin Karlyne Greenspan, MD  folic acid (FOLVITE) 1 MG tablet Take 1 tablet (1 mg total)  by mouth every morning. 10/08/13  Yes [name], MD  HYDROmorphone (DILAUDID) 4 MG tablet Take 1 tablet (4 mg total) by mouth every 4 (four) hours as needed for severe pain. 04/03/14  Yes [name], MD  hydroxyurea (HYDREA) 500 MG capsule Take 3 capsules (1,500 mg total) by mouth daily. May take with food to minimize GI side effects. 10/30/13  Yes [name], MD  lisinopril (PRINIVIL,ZESTRIL) 10 MG tablet Take 1 tablet (10 mg total) by mouth daily. 01/22/14  Yes [name], MD  metoprolol tartrate (LOPRESSOR) 25 MG tablet Take 1 tablet (25 mg total) by mouth daily. 08/22/13  Yes [name], MD  morphine (MS CONTIN) 30 MG 12 hr tablet Take 1 tablet (30 mg total) by mouth every 12 (twelve) hours. 02/26/14  Yes [name], MD  potassium chloride SA (K-DUR,KLOR-CON) 20 MEQ tablet Take 1 tablet (20 mEq total) by mouth every morning. 09/25/13  Yes [name], MD  rivaroxaban (XARELTO) 20 MG TABS tablet Take 1 tablet (20 mg total) by mouth every morning. 03/13/14  Yes Costin Karlyne Greenspan, MD  zolpidem (AMBIEN) 10 MG tablet Take 1 tablet (10 mg total) by mouth at bedtime as needed for sleep. 04/01/14  Yes [name], MD   BP 128/91 mmHg  Pulse 80  Temp(Src) 98.2 F (36.8 C) (Oral)  Resp 18  SpO2 100% Physical Exam  Constitutional: He is oriented to person, place, and time. He appears well-developed and well-nourished. No distress.  HENT:  Head: Normocephalic.  Eyes: Conjunctivae are normal. Pupils are equal, round, and reactive to light. No scleral icterus.  Neck: Normal range of motion. Neck supple. No thyromegaly present.  Cardiovascular: Normal rate and regular rhythm.  Exam reveals no gallop and no friction rub.   No murmur heard. Pulmonary/Chest: Effort normal and breath sounds normal. No respiratory distress. He has no wheezes. He has no rales.  Rebound or breath sounds. No wheezing rales rhonchi. Not hypoxemic.  Abdominal: Soft.  Bowel sounds are normal. He exhibits no distension. There is no tenderness. There is no rebound.  Musculoskeletal: Normal range of motion.  Neurological: He is alert and oriented to person, place, and time.  Skin: Skin is warm and dry. No rash noted.  Psychiatric: He has a normal mood and affect. His behavior is normal.    ED Course  Procedures (including critical care time) Labs Review Labs Reviewed  CBC WITH DIFFERENTIAL/PLATELET - Abnormal; Notable for the following:    WBC 13.6 (*)    RBC 2.13 (*)    Hemoglobin 6.6 (*)    HCT 20.2 (*)    RDW 24.4 (*)    Platelets 587 (*)    Neutro Abs 9.0 (*)    Monocytes Absolute 1.2 (*)    All other components  within normal limits  BASIC METABOLIC PANEL - Abnormal; Notable for the following:    Glucose, Bld 100 (*)    Anion gap 4 (*)    All other components within normal limits  RETICULOCYTES - Abnormal; Notable for the following:    Retic Ct Pct 11.1 (*)    RBC. 2.13 (*)    Retic Count, Manual 236.4 (*)    All other components within normal limits  PROTIME-INR - Abnormal; Notable for the following:    Prothrombin Time 16.8 (*)    All other components within normal limits    Imaging Review Dg Chest 2 View  04/06/2014   CLINICAL DATA:  LEFT-sided chest pain. Rib pain. Symptoms for 1 week. Sickle cell anemia.  EXAM: CHEST  2 VIEW  COMPARISON:  03/31/2014.  FINDINGS: LEFT subclavian power port unchanged. Cardiopericardial silhouette is enlarged but unchanged compared to prior. There is no airspace consolidation to suggest acute chest syndrome. No pleural effusions. Chronic pulmonary parenchymal scarring associated with sickle cell disease. Bony thoracic spine sickle cell changes better seen on prior CT than on today's radiograph. Enlargement of the pulmonary hila compatible with secondary pulmonary hypertension.  IMPRESSION: Chronic sickle cell changes without findings of acute chest syndrome.   Electronically Signed   By: Dereck Ligas M.D.    On: 04/06/2014 08:13     EKG Interpretation   Date/Time:  Sunday April 06 2014 07:59:29 EST Ventricular Rate:  69 PR Interval:  182 QRS Duration: 112 QT Interval:  435 QTC Calculation: 466 R Axis:   -67 Text Interpretation:  Sinus rhythm Borderline IVCD with LAD Inferior  infarct, old Probable anterior infarct, age indeterminate No significant  change vs 03-14-2014 Confirmed by Jeneen Rinks  MD, [place] ([id number]) on 04/06/2014  8:05:10 AM      MDM   Final diagnoses:  Chest pain  Hb-SS disease with crisis    Pt reports pain down to a 6/10 after first 2mg  dose Dilaudid.  Labs at baseline. Patient over for discharge. Has prescriptions for his chronic pain medications per his report. He simply needs to fill, and take them as prescribed to avoid exacerbations.    Tanna Furry, MD 04/06/14 [phone]

## 2014-04-06 NOTE — ED Notes (Signed)
Get rx filled. Pt states rx being filled today

## 2014-04-09 ENCOUNTER — Encounter (HOSPITAL_COMMUNITY): Payer: Self-pay

## 2014-04-09 ENCOUNTER — Non-Acute Institutional Stay (HOSPITAL_COMMUNITY)
Admission: AD | Admit: 2014-04-09 | Discharge: 2014-04-09 | Disposition: A | Discharge status: Home / Self Care | Payer: Medicare Other | Attending: Internal Medicine | Admitting: Internal Medicine

## 2014-04-09 ENCOUNTER — Telehealth (HOSPITAL_COMMUNITY): Payer: Self-pay | Admitting: Internal Medicine

## 2014-04-09 DIAGNOSIS — Z86711 Personal history of pulmonary embolism: Secondary | ICD-10-CM | POA: Diagnosis not present

## 2014-04-09 DIAGNOSIS — D72829 Elevated white blood cell count, unspecified: Secondary | ICD-10-CM | POA: Diagnosis not present

## 2014-04-09 DIAGNOSIS — Z79891 Long term (current) use of opiate analgesic: Secondary | ICD-10-CM | POA: Diagnosis not present

## 2014-04-09 DIAGNOSIS — D5700 Hb-SS disease with crisis, unspecified: Secondary | ICD-10-CM | POA: Diagnosis not present

## 2014-04-09 DIAGNOSIS — I10 Essential (primary) hypertension: Secondary | ICD-10-CM | POA: Diagnosis not present

## 2014-04-09 DIAGNOSIS — Z79899 Other long term (current) drug therapy: Secondary | ICD-10-CM | POA: Diagnosis not present

## 2014-04-09 DIAGNOSIS — Z7901 Long term (current) use of anticoagulants: Secondary | ICD-10-CM | POA: Diagnosis not present

## 2014-04-09 DIAGNOSIS — D473 Essential (hemorrhagic) thrombocythemia: Secondary | ICD-10-CM | POA: Insufficient documentation

## 2014-04-09 DIAGNOSIS — M5489 Other dorsalgia: Secondary | ICD-10-CM | POA: Diagnosis present

## 2014-04-09 DIAGNOSIS — Z87891 Personal history of nicotine dependence: Secondary | ICD-10-CM | POA: Diagnosis not present

## 2014-04-09 LAB — CBC WITH DIFFERENTIAL/PLATELET
Basophils Absolute: 0.1 10*3/uL (ref 0.0–0.1)
Basophils Relative: 1 % (ref 0–1)
EOS ABS: 0.3 10*3/uL (ref 0.0–0.7)
Eosinophils Relative: 2 % (ref 0–5)
HCT: 24.1 % — ABNORMAL LOW (ref 39.0–52.0)
Hemoglobin: 7.8 g/dL — ABNORMAL LOW (ref 13.0–17.0)
Lymphocytes Relative: 10 % — ABNORMAL LOW (ref 12–46)
Lymphs Abs: 1.4 10*3/uL (ref 0.7–4.0)
MCH: 30.5 pg (ref 26.0–34.0)
MCHC: 32.4 g/dL (ref 30.0–36.0)
MCV: 94.1 fL (ref 78.0–100.0)
Monocytes Absolute: 1.2 10*3/uL — ABNORMAL HIGH (ref 0.1–1.0)
Monocytes Relative: 9 % (ref 3–12)
NEUTROS ABS: 10.8 10*3/uL — AB (ref 1.7–7.7)
Neutrophils Relative %: 78 % — ABNORMAL HIGH (ref 43–77)
Platelets: 402 10*3/uL — ABNORMAL HIGH (ref 150–400)
RBC: 2.56 MIL/uL — ABNORMAL LOW (ref 4.22–5.81)
RDW: 23.7 % — AB (ref 11.5–15.5)
WBC: 13.8 10*3/uL — AB (ref 4.0–10.5)

## 2014-04-09 LAB — COMPREHENSIVE METABOLIC PANEL
ALT: 28 U/L (ref 0–53)
ANION GAP: 4 — AB (ref 5–15)
AST: 34 U/L (ref 0–37)
Albumin: 3.7 g/dL (ref 3.5–5.2)
Alkaline Phosphatase: 80 U/L (ref 39–117)
BILIRUBIN TOTAL: 3.3 mg/dL — AB (ref 0.3–1.2)
BUN: 9 mg/dL (ref 6–23)
CALCIUM: 8.7 mg/dL (ref 8.4–10.5)
CO2: 25 mmol/L (ref 19–32)
Chloride: 109 mmol/L (ref 96–112)
Creatinine, Ser: 0.57 mg/dL (ref 0.50–1.35)
GFR calc non Af Amer: >90 mL/min (ref >90)
GLUCOSE: 96 mg/dL (ref 70–99)
Potassium: 3.9 mmol/L (ref 3.5–5.1)
Sodium: 138 mmol/L (ref 135–145)
TOTAL PROTEIN: 7.2 g/dL (ref 6.0–8.3)

## 2014-04-09 LAB — LACTATE DEHYDROGENASE: LDH: 336 U/L — AB (ref 94–250)

## 2014-04-09 MED ORDER — SODIUM CHLORIDE 0.9 % IV SOLN: 25.0000 mg | INTRAVENOUS | Status: DC | PRN

## 2014-04-09 MED ORDER — NALOXONE HCL 0.4 MG/ML IJ SOLN: 0.4000 mg | INTRAMUSCULAR | Status: DC | PRN

## 2014-04-09 MED ORDER — KETOROLAC TROMETHAMINE 30 MG/ML IJ SOLN
30.0000 mg | Freq: Once | INTRAMUSCULAR | Status: AC
  Administered 2014-04-09: 30 mg via INTRAVENOUS
  Filled 2014-04-09: qty 1

## 2014-04-09 MED ORDER — HYDROMORPHONE HCL 4 MG PO TABS
4.0000 mg | ORAL_TABLET | Freq: Once | ORAL | Status: AC
Start: 2014-04-09 — End: 2014-04-09
  Administered 2014-04-09: 4 mg via ORAL
  Filled 2014-04-09: qty 1

## 2014-04-09 MED ORDER — HEPARIN SOD (PORK) LOCK FLUSH 100 UNIT/ML IV SOLN
500.0000 [IU] | INTRAVENOUS | Status: AC | PRN
  Administered 2014-04-09: 500 [IU]
  Filled 2014-04-09: qty 5

## 2014-04-09 MED ORDER — HYDROMORPHONE HCL 2 MG/ML IJ SOLN
2.7000 mg | Freq: Once | INTRAMUSCULAR | Status: AC
Start: 2014-04-09 — End: 2014-04-09
  Administered 2014-04-09: 2.7 mg via INTRAVENOUS
  Filled 2014-04-09: qty 2

## 2014-04-09 MED ORDER — HYDROMORPHONE HCL 2 MG/ML IJ SOLN
2.2000 mg | Freq: Once | INTRAMUSCULAR | Status: AC
Start: 2014-04-09 — End: 2014-04-09
  Administered 2014-04-09: 2.2 mg via INTRAVENOUS
  Filled 2014-04-09: qty 2

## 2014-04-09 MED ORDER — ONDANSETRON HCL 4 MG/2ML IJ SOLN: 4.0000 mg | Freq: Four times a day (QID) | INTRAMUSCULAR | Status: DC | PRN

## 2014-04-09 MED ORDER — SODIUM CHLORIDE 0.9 % IJ SOLN
10.0000 mL | INTRAMUSCULAR | Status: AC | PRN
  Administered 2014-04-09: 10 mL

## 2014-04-09 MED ORDER — HYDROMORPHONE 2 MG/ML HIGH CONCENTRATION IV PCA SOLN
INTRAVENOUS | Status: DC
  Administered 2014-04-09: 13:00:00 via INTRAVENOUS
  Administered 2014-04-09: 11.2 mg via INTRAVENOUS
  Filled 2014-04-09 (×2): qty 25

## 2014-04-09 MED ORDER — HYDROMORPHONE HCL 2 MG/ML IJ SOLN
1.8000 mg | Freq: Once | INTRAMUSCULAR | Status: AC
  Administered 2014-04-09: 1.8 mg via INTRAVENOUS
  Filled 2014-04-09: qty 1

## 2014-04-09 MED ORDER — DIPHENHYDRAMINE HCL 25 MG PO CAPS
25.0000 mg | ORAL_CAPSULE | Freq: Four times a day (QID) | ORAL | Status: DC | PRN
  Administered 2014-04-09: 25 mg via ORAL
  Filled 2014-04-09: qty 1

## 2014-04-09 MED ORDER — DEXTROSE-NACL 5-0.45 % IV SOLN
INTRAVENOUS | Status: DC
  Administered 2014-04-09: 12:00:00 via INTRAVENOUS

## 2014-04-09 MED ORDER — SODIUM CHLORIDE 0.9 % IJ SOLN: 9.0000 mL | INTRAMUSCULAR | Status: DC | PRN

## 2014-04-09 NOTE — Progress Notes (Signed)
Pt arrived at day hospital for evaluation; MD notified; awaiting new orders; will continue to monitor

## 2014-04-09 NOTE — Discharge Summary (Signed)
Physician Discharge Summary  [name] IRW:[id number] DOB: 1980/02/07 DOA: 04/09/2014  PCP: Gerald Lynk A., MD  Admit date: 04/09/2014 Discharge date: 04/09/2014  Discharge Diagnoses:  Active Problems:   Sickle cell pain crisis   Discharge Condition: Good  Disposition: Home Follow-up Information    Follow up with Gerald Warriner A., MD.   Specialty:  Internal Medicine   Why:  As needed or as previously scheduled   Contact information:   [place] [id number] [phone]       Diet: Regular  Wt Readings from Last 3 Encounters:  04/03/14 163 lb (73.936 kg)  03/24/14 153 lb 1.6 oz (69.446 kg)  03/11/14 160 lb 15 oz (73 kg)    History of present illness:  [name], patient with a history of sickle cell anemia, HBSS and frequent vaso-occlusive crisis, presents complaining of bilateral flank and mid back pain for 1 week. He attributes current pain crisis to changes in weather and extended work hours. 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 Current pain intensity is 7/10 described as constant and throbbing. He states that he Dilaudid 4 mg around 8 am with minimal relief. He has taken all home medications this am. He currently denies shortness of breath, fatigue, chest pain, nausea, vomiting, or diarrhea. [name] does not show any atypical features and is appropriate for treatment in the day infusion center for extended observation.   Hospital Course:  Pt was initially treated with weight based rapid re-dosing IVF and Toradol . Pain remained above 7/10 and Pt was transitioned to a weight based Dilaudid PCA . Patient's pain decreased to 5/10  And his function was at baseline at the time of discharge. He is discharged home in stable condition.Marland Kitchen   Discharge Exam:  Filed Vitals:   04/09/14 1504  BP: 112/74  Pulse: 92  Temp:   Resp: 19   Filed Vitals:   04/09/14 1103 04/09/14  1304 04/09/14 1404 04/09/14 1504  BP: 119/78  125/73 112/74  Pulse: 87  86 92  Temp: 99 F (37.2 C)  98.5 F (36.9 C)   TempSrc: Oral  Oral   Resp: 18 18 20 19   SpO2: 100% 93% 92% 96%    General: Alert, awake, oriented x3, in no acute distress.  HEENT: [place]/AT PEERL, EOMI Neck: Trachea midline,  no masses, no thyromegal,y no JVD, no carotid bruit OROPHARYNX:  Moist, No exudate/ erythema/lesions.  Heart: Regular rate and rhythm, without murmurs, rubs, gallops, PMI non-displaced, no heaves or thrills on palpation.  Lungs: Clear to auscultation, no wheezing or rhonchi noted. No increased vocal fremitus resonant to percussion  Abdomen: Soft, nontender, nondistended, positive bowel sounds, no masses no hepatosplenomegaly noted.  Neuro: No focal neurological deficits noted cranial nerves II through XII grossly intact.  Strength at baaseline in bilateral upper and lower extremities. Musculoskeletal: No warm swelling or erythema around joints, no spinal tenderness noted. Psychiatric: Patient alert and oriented x3, good insight and cognition, good recent to remote recall. Lymph node survey: No cervical axillary or inguinal lymphadenopathy noted.   Discharge Instructions  Discharge Instructions    Activity as tolerated - No restrictions    Complete by:  As directed      Diet - low sodium heart healthy    Complete by:  As directed             Medication List    STOP taking  these medications        amoxicillin-clavulanate 875-125 MG per tablet  Commonly known as:  AUGMENTIN      TAKE these medications        diltiazem 120 MG 24 hr capsule  Commonly known as:  CARDIZEM CD  Take 1 capsule (120 mg total) by mouth daily.     folic acid 1 MG tablet  Commonly known as:  FOLVITE  Take 1 tablet (1 mg total) by mouth every morning.     HYDROmorphone 4 MG tablet  Commonly known as:  DILAUDID  Take 1 tablet (4 mg total) by mouth every 4 (four) hours as needed for severe pain.      hydroxyurea 500 MG capsule  Commonly known as:  HYDREA  Take 3 capsules (1,500 mg total) by mouth daily. May take with food to minimize GI side effects.     lisinopril 10 MG tablet  Commonly known as:  PRINIVIL,ZESTRIL  Take 1 tablet (10 mg total) by mouth daily.     metoprolol tartrate 25 MG tablet  Commonly known as:  LOPRESSOR  Take 1 tablet (25 mg total) by mouth daily.     morphine 30 MG 12 hr tablet  Commonly known as:  MS CONTIN  Take 1 tablet (30 mg total) by mouth every 12 (twelve) hours.     potassium chloride SA 20 MEQ tablet  Commonly known as:  K-DUR,KLOR-CON  Take 1 tablet (20 mEq total) by mouth every morning.     rivaroxaban 20 MG Tabs tablet  Commonly known as:  XARELTO  Take 1 tablet (20 mg total) by mouth every morning.     Vitamin D 2000 UNITS tablet  Take 1 tablet (2,000 Units total) by mouth daily.     zolpidem 10 MG tablet  Commonly known as:  AMBIEN  Take 1 tablet (10 mg total) by mouth at bedtime as needed for sleep.          The results of significant diagnostics from this hospitalization (including imaging, microbiology, ancillary and laboratory) are listed below for reference.    Significant Diagnostic Studies: Dg Chest 2 View  04/06/2014   CLINICAL DATA:  LEFT-sided chest pain. Rib pain. Symptoms for 1 week. Sickle cell anemia.  EXAM: CHEST  2 VIEW  COMPARISON:  03/31/2014.  FINDINGS: LEFT subclavian power port unchanged. Cardiopericardial silhouette is enlarged but unchanged compared to prior. There is no airspace consolidation to suggest acute chest syndrome. No pleural effusions. Chronic pulmonary parenchymal scarring associated with sickle cell disease. Bony thoracic spine sickle cell changes better seen on prior CT than on today's radiograph. Enlargement of the pulmonary hila compatible with secondary pulmonary hypertension.  IMPRESSION: Chronic sickle cell changes without findings of acute chest syndrome.   Electronically Signed   By: Dereck Ligas M.D.   On: 04/06/2014 08:13   Dg Chest 2 View  03/31/2014   CLINICAL DATA:  Chest pain and shortness of breath. Sickle cell crisis.  EXAM: CHEST  2 VIEW  COMPARISON:  Chest x-ray dated 03/16/2014 and chest CT 03/19/2014  FINDINGS: Chronic cardiomegaly. Pulmonary vascularity is within normal limits. Power port in place. Chronic accentuation of the interstitial markings. No acute infiltrates.  IMPRESSION: Chronic cardiomegaly.  Stable scarring in the lungs.   Electronically Signed   By: Lorriane Shire M.D.   On: 03/31/2014 12:29   Dg Chest 2 View  03/16/2014   CLINICAL DATA:  Shortness of breath and chest pain. Sickle cell patient.  EXAM: CHEST  2 VIEW  COMPARISON:  03/14/2014  FINDINGS: Left anterior chest wall Port-A-Cath is present with tip projecting over the superior cavoatrial junction. Stable cardiomegaly. No consolidative pulmonary opacities. No pleural effusion or pneumothorax.  IMPRESSION: No acute cardiopulmonary process.   Electronically Signed   By: Lovey Newcomer M.D.   On: 03/16/2014 15:14   Dg Chest 2 View  03/14/2014   CLINICAL DATA:  Dizzy and weak, history of sickle cell  EXAM: CHEST  2 VIEW  COMPARISON:  03/11/2014  FINDINGS: Cardiac shadow remains enlarged. A left chest wall port is again seen. The lungs are clear without focal infiltrate. The bony structures are within normal limits.  IMPRESSION: No acute abnormality is noted. The patient does have a recent diagnosis of acute on chronic pulmonary embolism on 03/11/2014   Electronically Signed   By: Inez Catalina M.D.   On: 03/14/2014 14:58   Dg Chest 2 View  03/11/2014   CLINICAL DATA:  Acute onset of shortness of breath. Decreased O2 saturation. Generalized weakness and pain. Initial encounter.  EXAM: CHEST  2 VIEW  COMPARISON:  Chest radiograph performed 03/06/2014  FINDINGS: The lungs are well-aerated. Vascular congestion is noted. Minimal bilateral atelectasis is seen. There is no evidence of pleural effusion or pneumothorax.   The heart is mildly enlarged. A left-sided chest port is noted ending about the cavoatrial junction. No acute osseous abnormalities are seen.  IMPRESSION: Vascular congestion and mild cardiomegaly. Minimal bilateral atelectasis seen.   Electronically Signed   By: Garald Balding M.D.   On: 03/11/2014 06:24   Ct Head Wo Contrast  03/18/2014   CLINICAL DATA:  Left upper extremity acute weakness, dizziness, slurred speech with expressive dysphasia. History of the sickle cell disease.  EXAM: CT HEAD WITHOUT CONTRAST  TECHNIQUE: Contiguous axial images were obtained from the base of the skull through the vertex without contrast.  COMPARISON:  04/25/2011  FINDINGS: Mild brain atrophy pattern for the patient's young age. Remote caudate lacunar infarct on the left. Periventricular mild chronic white matter microvascular ischemic changes suspected. No acute intracranial hemorrhage, known infarction, mass lesion, midline shift, herniation, hydrocephalus, or extra-axial fluid collection. No focal mass effect or edema. Cisterns patent. Remote right cerebellar lacunar type infarct. Mild cerebellar atrophy as well.  Mastoids and sinuses remain clear.  No acute osseous finding.  IMPRESSION: No acute intracranial finding.  Remote left caudate and cerebellar lacunar-type infarcts.  Atrophy and chronic white matter microvascular ischemic changes.   Electronically Signed   By: Jerilynn Mages.  Shick M.D.   On: 03/18/2014 15:38   Ct Chest W Contrast  03/19/2014   CLINICAL DATA:  Short of breath. Fever since last night. Pulmonary embolism. Acute on chronic pulmonary embolism.  EXAM: CT CHEST WITH CONTRAST  TECHNIQUE: Multidetector CT imaging of the chest was performed during intravenous contrast administration.  CONTRAST:  5mL OMNIPAQUE IOHEXOL 300 MG/ML  SOLN  COMPARISON:  03/11/2013.  FINDINGS: Musculoskeletal: Sickle cell bony changes are present throughout the thoracic spine with a H shaped vertebral bodies. Diffuse osseous sclerosis  compatible with old infarcts associated with sickle cell anemia.  Lungs: There is a peripheral mosaic attenuation pattern consistent with acute chest syndrome. The differential considerations are pulmonary edema, small vessel infarcts with infection or interstitial lung disease unlikely.  Central airways: Patent.  Vasculature: Grossly within normal limits. LEFT subclavian Port-A-Cath terminating in the RIGHT atrium. Cardiomegaly is present. This is a chronic finding and appears similar to the prior CT and radiographs.  Effusions: Tiny bilateral pleural effusions layering dependently. Small pericardial effusion.  Lymphadenopathy: Unchanged prevascular lymphadenopathy. No axillary adenopathy.  Esophagus: Normal.  Upper abdomen: Periportal edema is present. Splenic auto infarction.  Other: None.  IMPRESSION: 1. Scattered areas of ground-glass attenuation throughout the lungs bilaterally. Findings are compatible with acute chest syndrome in a patient with sickle cell disease. General differential considerations in acute chest include pulmonary edema, pulmonary hemorrhage, infection/inflammation and fat embolism. Given other findings, fat embolism and pulmonary edema are the most likely considerations. Unchanged cardiomegaly. 2. Small bilateral pleural effusions. Small pericardial effusion and cardiomegaly. 3. Stable prevascular mediastinal adenopathy.   Electronically Signed   By: Dereck Ligas M.D.   On: 03/19/2014 13:24   Ct Angio Chest Pe W/cm &/or Wo Cm  03/11/2014   CLINICAL DATA:  Sickle cell disease with sudden onset left-sided chest pain.  EXAM: CT ANGIOGRAPHY CHEST WITH CONTRAST  TECHNIQUE: Multidetector CT imaging of the chest was performed using the standard protocol during bolus administration of intravenous contrast. Multiplanar CT image reconstructions and MIPs were obtained to evaluate the vascular anatomy.  CONTRAST:  162mL OMNIPAQUE IOHEXOL 350 MG/ML SOLN  COMPARISON:  01/01/2014  FINDINGS: THORACIC  INLET/BODY WALL:  Left subclavian porta catheter, tip in the right atrium.  MEDIASTINUM:  There is multi focal pulmonary embolism with clot primarily subsegmental but intermittently occlusive. Clot in the posterior segment left upper lobe on image 92, and in the left lower lobe on image 175 and 183 is chronic and without progression from 01/01/2014. The lower lobe clot was previously obscured by artifact, but with the benefit of the current scan was present. There is acute pulmonary embolism to the right upper and right lower lobes, visualized on images 117, 164 and 177.  There is chronic cardiomegaly and right heart enlargement, which limits utility of the RV to LV ratio. The main pulmonary artery is chronically dilated at 37 mm. There is no progression of these findings to suggest acute right heart failure. No pericardial effusion. No aortic dissection. There is chronic mild enlargement of upper mediastinal lymph nodes.  LUNG WINDOWS:  Patchy ground-glass density in the bilateral lungs, with lower lung subsegmental atelectasis or scarring that is stable from prior and likely fixed, chronic lung disease. No lung infarct noted.  UPPER ABDOMEN:  Splenic auto infarction.  OSSEOUS:  Typical osseous changes of sickle cell disease.  Critical Value/emergent results were called by telephone at the time of interpretation on 03/11/2014 at 2:00 pm to Dr. Charlynne Cousins , who verbally acknowledged these results.  Review of the MIP images confirms the above findings.  IMPRESSION: Acute on chronic pulmonary embolism with diffuse subsegmental clot. Chronic pulmonary artery hypertension; no morphologic change to suggest acute right heart failure.   Electronically Signed   By: Monte Fantasia M.D.   On: 03/11/2014 14:08   Nm Pulmonary Perf And Vent  03/14/2014   CLINICAL DATA:  Hypoxia.  Sickle cell disease  EXAM: NUCLEAR MEDICINE VENTILATION - PERFUSION LUNG SCAN  Views: Anterior, posterior, left lateral, right lateral, RPO,  LPO, RAO, LAO -ventilation and perfusion  Radionuclide: Technetium 7m DTPA -ventilation; Technetium 64m macroaggregated albumin-perfusion  Dose:  35.3 mCi-ventilation; 5.3 mCi-perfusion  Route of administration: Inhalation-ventilation; intravenous-perfusion  COMPARISON:  Chest radiograph March 14, 2014; prior ventilation and perfusion lung scan Jun 19, 2013 ; chest CT angiogram March 11, 2014  FINDINGS: Ventilation: Radiotracer uptake is homogeneous and symmetric bilaterally. Cardiomegaly is noted. No focal ventilation defects are identified. The appearance is stable  compared to prior lung scan.  Perfusion: This patient has known peripheral chronic pulmonary emboli based on CT appearance. On the perfusion study, there are peripheral small wedge-shaped defects, stable from prior lung scan consistent with chronic pulmonary emboli in the periphery as noted on recent CT as well. There is no segmental perfusion defect. The appearance is stable compared to prior study.  IMPRESSION: Evidence of small peripheral chronic emboli bilaterally based on prior lung scan and recent chest CT angiogram appearance. No new pulmonary embolus is appreciable on this study. No segmental perfusion defects seen currently. No ventilation defects identified. Cardiomegaly is noted.   Electronically Signed   By: Lowella Grip III M.D.   On: 03/14/2014 18:41    Microbiology: No results found for this or any previous visit (from the past 240 hour(s)).   Labs: Basic Metabolic Panel:  Recent Labs Lab 04/03/14 1115 04/03/14 1230 04/06/14 0811 04/09/14 1140  NA QUESTIONABLE RESULTS, RECOMMEND RECOLLECT TO VERIFY 138 139 138  K QUESTIONABLE RESULTS, RECOMMEND RECOLLECT TO VERIFY 3.9 3.7 3.9  CL QUESTIONABLE RESULTS, RECOMMEND RECOLLECT TO VERIFY 108 111 109  CO2 QUESTIONABLE RESULTS, RECOMMEND RECOLLECT TO VERIFY 26 24 25   GLUCOSE QUESTIONABLE RESULTS, RECOMMEND RECOLLECT TO VERIFY 100* 100* 96  BUN QUESTIONABLE RESULTS,  RECOMMEND RECOLLECT TO VERIFY 9 8 9   CREATININE QUESTIONABLE RESULTS, RECOMMEND RECOLLECT TO VERIFY 0.63 0.65 0.57  CALCIUM QUESTIONABLE RESULTS, RECOMMEND RECOLLECT TO VERIFY 8.8 8.7 8.7   Liver Function Tests:  Recent Labs Lab 04/03/14 1115 04/03/14 1230 04/09/14 1140  AST QUESTIONABLE RESULTS, RECOMMEND RECOLLECT TO VERIFY 29 34  ALT QUESTIONABLE RESULTS, RECOMMEND RECOLLECT TO VERIFY 31 28  ALKPHOS QUESTIONABLE RESULTS, RECOMMEND RECOLLECT TO VERIFY 63 80  BILITOT QUESTIONABLE RESULTS, RECOMMEND RECOLLECT TO VERIFY 1.8* 3.3*  PROT QUESTIONABLE RESULTS, RECOMMEND RECOLLECT TO VERIFY 7.1 7.2  ALBUMIN QUESTIONABLE RESULTS, RECOMMEND RECOLLECT TO VERIFY 3.6 3.7   No results for input(s): LIPASE, AMYLASE in the last 168 hours. No results for input(s): AMMONIA in the last 168 hours. CBC:  Recent Labs Lab 04/03/14 1115 04/03/14 1212 04/06/14 0811 04/09/14 1140  WBC QUESTIONABLE RESULTS, RECOMMEND RECOLLECT TO VERIFY 12.2* 13.6* 13.8*  NEUTROABS  --  8.6* 9.0* 10.8*  HGB QUESTIONABLE RESULTS, RECOMMEND RECOLLECT TO VERIFY 6.3* 6.6* 7.8*  HCT QUESTIONABLE RESULTS, RECOMMEND RECOLLECT TO VERIFY 19.1* 20.2* 24.1*  MCV QUESTIONABLE RESULTS, RECOMMEND RECOLLECT TO VERIFY 94.1 94.8 94.1  PLT QUESTIONABLE RESULTS, RECOMMEND RECOLLECT TO VERIFY 643* 587* 402*   Cardiac Enzymes: No results for input(s): CKTOTAL, CKMB, CKMBINDEX, TROPONINI in the last 168 hours. BNP: Invalid input(s): POCBNP CBG: No results for input(s): GLUCAP in the last 168 hours. Ferritin: No results for input(s): FERRITIN in the last 168 hours.  Time coordinating discharge: 35 minutes  Signed:  Jaylena Holloway A.  04/09/2014, 5:18 PM

## 2014-04-09 NOTE — Telephone Encounter (Signed)
Pt called and states having pain left side and back; rates pain 7/10; took home meds with no relief; denies CP, SOB, nausea, vomiting, diarrhea; MD notified

## 2014-04-09 NOTE — H&P (Signed)
[facility] History and Physical   Date: 04/09/2014  Patient name: [name] Medical record number: [id number] Date of birth: 1980/09/15 Age: 34 y.o. Gender: male PCP: [name],[name], MD  Attending physician: [name], MD  Chief Complaint: Right flank and mid back pain  History of Present Illness: [name],  patient with a history of sickle cell anemia, HBSS and frequent vaso-occlusive crisis,  presents complaining of bilateral flank and mid back pain for 1 week. He attributes current pain crisis to changes in weather and extended work hours. 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 Current pain intensity is 7/10 described as constant and throbbing. He states that he Dilaudid 4 mg around 8 am with minimal relief. He has taken all home medications this am. He currently denies shortness of breath, fatigue, chest pain, nausea, vomiting, or diarrhea. [name] does not show any atypical features and is appropriate for treatment in the day infusion center for extended observation.   Meds: Prescriptions prior to admission  Medication Sig Dispense Refill Last Dose  . amoxicillin-clavulanate (AUGMENTIN) 875-125 MG per tablet Take 1 tablet by mouth 2 (two) times daily. 10 tablet 0 04/05/2014  . Cholecalciferol (VITAMIN D) 2000 UNITS tablet Take 1 tablet (2,000 Units total) by mouth daily. 90 tablet 3 04/05/2014  . diltiazem (CARDIZEM CD) 120 MG 24 hr capsule Take 1 capsule (120 mg total) by mouth daily. 30 capsule 1 08/08/2954  . folic acid (FOLVITE) 1 MG tablet Take 1 tablet (1 mg total) by mouth every morning. 30 tablet 11 04/05/2014  . HYDROmorphone (DILAUDID) 4 MG tablet Take 1 tablet (4 mg total) by mouth every 4 (four) hours as needed for severe pain. 90 tablet 0 04/05/2014  . hydroxyurea (HYDREA) 500 MG capsule Take 3 capsules (1,500 mg total) by mouth daily. May take with food to minimize GI side  effects. 60 capsule 3 04/05/2014  . lisinopril (PRINIVIL,ZESTRIL) 10 MG tablet Take 1 tablet (10 mg total) by mouth daily. 30 tablet 1 04/05/2014  . metoprolol tartrate (LOPRESSOR) 25 MG tablet Take 1 tablet (25 mg total) by mouth daily. 30 tablet 6 04/05/2014  . morphine (MS CONTIN) 30 MG 12 hr tablet Take 1 tablet (30 mg total) by mouth every 12 (twelve) hours. 60 tablet 0 04/05/2014  . potassium chloride SA (K-DUR,KLOR-CON) 20 MEQ tablet Take 1 tablet (20 mEq total) by mouth every morning. 30 tablet 3 04/05/2014  . rivaroxaban (XARELTO) 20 MG TABS tablet Take 1 tablet (20 mg total) by mouth every morning. 30 tablet 2 04/05/2014 at 1000  . zolpidem (AMBIEN) 10 MG tablet Take 1 tablet (10 mg total) by mouth at bedtime as needed for sleep. 30 tablet 3 04/05/2014    Allergies: Review of patient's allergies indicates no known allergies. Past Medical History  Diagnosis Date  . Sickle cell anemia   . Blood transfusion   . Acute embolism and thrombosis of right internal jugular vein   . Hypokalemia   . Mood disorder   . History of pulmonary embolus (PE)   . Avascular necrosis   . Leukocytosis     Chronic  . Thrombocytosis     Chronic  . Hypertension   . History of Clostridium difficile infection   . Uses marijuana   . Chronic anticoagulation   . Functional asplenia   . Former smoker   . Second hand tobacco smoke exposure   .  Alcohol consumption of one to four drinks per day   . Noncompliance with medication regimen   . Sickle-cell crisis with associated acute chest syndrome 05/13/2013  . Acute chest syndrome 06/18/2013  . Demand ischemia 01/02/2014   Past Surgical History  Procedure Laterality Date  . Right hip replacement      08/2006  . Cholecystectomy      01/2008  . Porta cath placement    . Porta cath removal    . Umbilical hernia repair      01/2008  . Excision of left periauricular cyst      10/2009  . Excision of right ear lobe cyst with primary closur      11/2007  . Portacath  placement  01/05/2012    Procedure: INSERTION PORT-A-CATH;  Surgeon: Odis Hollingshead, MD;  Location: [place];  Service: General;  Laterality: N/A;  ultrasound guiced port a cath insertion with fluoroscopy   Family History  Problem Relation Age of Onset  . Sickle cell trait Mother   . Depression Mother   . Diabetes Mother   . Sickle cell trait Father   . Sickle cell trait Brother    History   Social History  . Marital Status: Single    Spouse Name: N/A  . Number of Children: 0  . Years of Education: 13   Occupational History  . 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 Smoking status: Former Smoker -- 13 years    Quit date: 07/08/2010  . Smokeless tobacco: Never Used  . Alcohol Use: 1.2 oz/week    2 Shots of liquor per week  . Drug Use: 2.00 per week    Special: Marijuana  . Sexual Activity:    Partners: Female    Museum/gallery curator: None     Comment: month ago   Other Topics Concern  . Not on file   Social History Narrative   Lives in an apartment.  Single.  Lives alone but has a girlfriend that helps care for him.  Does not use any assist devices.        [name]:  [phone] Mom, emergency contact    Review of Systems: Constitutional: negative Eyes: positive for icterus Ears, nose, mouth, throat, and face: negative Respiratory: negative Cardiovascular: negative Gastrointestinal: negative Genitourinary:negative Hematologic/lymphatic: negative Musculoskeletal:positive for back pain, myalgias and right and left flank pain Neurological: negative Endocrine: negative Allergic/Immunologic: negative  Physical Exam: Blood pressure 119/78, pulse 87, temperature 99 F (37.2 C), temperature source Oral, resp. rate 18, SpO2 100 %. BP 119/78 mmHg  Pulse 87  Temp(Src) 99 F (37.2 C) (Oral)  Resp 18  SpO2 100% General appearance: alert, cooperative and mild distress Head: Normocephalic, without  obvious abnormality, atraumatic Eyes: positive findings: sclera icteric Nose: Nares normal. Septum midline. Mucosa normal. No drainage or sinus tenderness. Throat: lips, mucosa, and tongue normal; teeth and gums normal Neck: no adenopathy, no carotid bruit, no JVD, supple, symmetrical, trachea midline and thyroid not enlarged, symmetric, no tenderness/mass/nodules Back: symmetric, no curvature. ROM normal. No CVA tenderness. Lungs: clear to auscultation bilaterally Chest wall: no tenderness Heart: regularly irregular rhythm Abdomen: soft, non-tender; bowel sounds normal; no masses,  no organomegaly Extremities: extremities normal, atraumatic, no cyanosis or edema Skin: Skin color, texture, turgor normal. No rashes or lesions Lymph nodes: Cervical, supraclavicular, and axillary nodes normal. Neurologic: Alert and oriented X 3, normal strength and tone. Normal  symmetric reflexes. Normal coordination and gait  Lab results: No results found for this or any previous visit (from the past 24 hour(s)).  Imaging results:  No results found.   Assessment & Plan: Patient admitted to the day infusion center for extended observation Will start hypotonic IV fluid for cellular rehydration Toradol 30 mg IV times one for inflammation Dilaudid IV per weight based rapid re-dosing for pain management. Will transition to high concentration PCA per protocol if pain intensity remains greater than 7/10.  CBC w/differential, CMP, and LDH Will review plan of care with Dr. Liston Alba   [facility] M 04/09/2014, 11:20 AM

## 2014-04-09 NOTE — Telephone Encounter (Signed)
Notified pt that, per MD, he may come to day hospital for evaluation; pt verbalizes understanding

## 2014-04-09 NOTE — Progress Notes (Signed)
Patient ID: [name], male   DOB: 1980-12-28, 34 y.o.   MRN: [id number]  Pt discharged home ambulatory. Pt a&ox4, pt in no distress. Pt reports mild "tolerable"pain at time of discharge. Lt chest port flushed, packed with heparin and discontinued. Pt tolerated. Pt verbalized understanding of all discharge instructions and follow-up appointments.

## 2014-04-14 ENCOUNTER — Observation Stay (HOSPITAL_COMMUNITY)
Admission: AD | Admit: 2014-04-14 | Discharge: 2014-04-14 | Disposition: A | Discharge status: Home / Self Care | Payer: Medicare Other | Attending: Internal Medicine | Admitting: Internal Medicine

## 2014-04-14 ENCOUNTER — Telehealth (HOSPITAL_COMMUNITY): Payer: Self-pay | Admitting: Internal Medicine

## 2014-04-14 ENCOUNTER — Telehealth: Payer: Self-pay | Admitting: Internal Medicine

## 2014-04-14 DIAGNOSIS — Z96641 Presence of right artificial hip joint: Secondary | ICD-10-CM | POA: Insufficient documentation

## 2014-04-14 DIAGNOSIS — D57819 Other sickle-cell disorders with crisis, unspecified: Secondary | ICD-10-CM | POA: Diagnosis not present

## 2014-04-14 DIAGNOSIS — F1099 Alcohol use, unspecified with unspecified alcohol-induced disorder: Secondary | ICD-10-CM | POA: Insufficient documentation

## 2014-04-14 DIAGNOSIS — Z87891 Personal history of nicotine dependence: Secondary | ICD-10-CM | POA: Insufficient documentation

## 2014-04-14 DIAGNOSIS — F1210 Cannabis abuse, uncomplicated: Secondary | ICD-10-CM | POA: Insufficient documentation

## 2014-04-14 DIAGNOSIS — Z86711 Personal history of pulmonary embolism: Secondary | ICD-10-CM | POA: Diagnosis not present

## 2014-04-14 DIAGNOSIS — Z7901 Long term (current) use of anticoagulants: Secondary | ICD-10-CM | POA: Insufficient documentation

## 2014-04-14 DIAGNOSIS — I10 Essential (primary) hypertension: Secondary | ICD-10-CM | POA: Insufficient documentation

## 2014-04-14 DIAGNOSIS — Z9114 Patient's other noncompliance with medication regimen: Secondary | ICD-10-CM | POA: Diagnosis not present

## 2014-04-14 DIAGNOSIS — Z9049 Acquired absence of other specified parts of digestive tract: Secondary | ICD-10-CM | POA: Insufficient documentation

## 2014-04-14 DIAGNOSIS — D5700 Hb-SS disease with crisis, unspecified: Secondary | ICD-10-CM | POA: Diagnosis not present

## 2014-04-14 DIAGNOSIS — D571 Sickle-cell disease without crisis: Secondary | ICD-10-CM

## 2014-04-14 DIAGNOSIS — G894 Chronic pain syndrome: Secondary | ICD-10-CM

## 2014-04-14 LAB — COMPREHENSIVE METABOLIC PANEL
ALBUMIN: 4 g/dL (ref 3.5–5.2)
ALK PHOS: 85 U/L (ref 39–117)
ALT: 24 U/L (ref 0–53)
AST: 37 U/L (ref 0–37)
Anion gap: 6 (ref 5–15)
BUN: 10 mg/dL (ref 6–23)
CALCIUM: 8.8 mg/dL (ref 8.4–10.5)
CO2: 23 mmol/L (ref 19–32)
CREATININE: 0.55 mg/dL (ref 0.50–1.35)
Chloride: 108 mmol/L (ref 96–112)
GFR calc non Af Amer: >90 mL/min (ref >90)
Glucose, Bld: 102 mg/dL — ABNORMAL HIGH (ref 70–99)
Potassium: 4.1 mmol/L (ref 3.5–5.1)
Sodium: 137 mmol/L (ref 135–145)
TOTAL PROTEIN: 7.1 g/dL (ref 6.0–8.3)
Total Bilirubin: 4.7 mg/dL — ABNORMAL HIGH (ref 0.3–1.2)

## 2014-04-14 LAB — CBC WITH DIFFERENTIAL/PLATELET
BASOS PCT: 1 % (ref 0–1)
Basophils Absolute: 0.1 10*3/uL (ref 0.0–0.1)
EOS PCT: 3 % (ref 0–5)
Eosinophils Absolute: 0.3 10*3/uL (ref 0.0–0.7)
HCT: 21.3 % — ABNORMAL LOW (ref 39.0–52.0)
HEMOGLOBIN: 7.2 g/dL — AB (ref 13.0–17.0)
LYMPHS ABS: 0.9 10*3/uL (ref 0.7–4.0)
Lymphocytes Relative: 10 % — ABNORMAL LOW (ref 12–46)
MCH: 32.1 pg (ref 26.0–34.0)
MCHC: 33.8 g/dL (ref 30.0–36.0)
MCV: 95.1 fL (ref 78.0–100.0)
Monocytes Absolute: 1.1 10*3/uL — ABNORMAL HIGH (ref 0.1–1.0)
Monocytes Relative: 12 % (ref 3–12)
NEUTROS ABS: 7 10*3/uL (ref 1.7–7.7)
Neutrophils Relative %: 74 % (ref 43–77)
Platelets: 335 10*3/uL (ref 150–400)
RBC: 2.24 MIL/uL — ABNORMAL LOW (ref 4.22–5.81)
RDW: 24 % — ABNORMAL HIGH (ref 11.5–15.5)
WBC: 9.4 10*3/uL (ref 4.0–10.5)
nRBC: 2 /100 WBC — ABNORMAL HIGH

## 2014-04-14 LAB — RETICULOCYTES
RBC.: 2.24 MIL/uL — ABNORMAL LOW (ref 4.22–5.81)
RETIC CT PCT: 6.7 % — AB (ref 0.4–3.1)
Retic Count, Absolute: 150.1 10*3/uL (ref 19.0–186.0)

## 2014-04-14 LAB — LACTATE DEHYDROGENASE: LDH: 363 U/L — AB (ref 94–250)

## 2014-04-14 MED ORDER — HEPARIN SOD (PORK) LOCK FLUSH 100 UNIT/ML IV SOLN
500.0000 [IU] | INTRAVENOUS | Status: AC | PRN
  Administered 2014-04-14: 500 [IU]
  Filled 2014-04-14: qty 5

## 2014-04-14 MED ORDER — HYDROMORPHONE 2 MG/ML HIGH CONCENTRATION IV PCA SOLN
INTRAVENOUS | Status: DC
  Administered 2014-04-14: 12:00:00 via INTRAVENOUS
  Filled 2014-04-14 (×2): qty 25

## 2014-04-14 MED ORDER — SODIUM CHLORIDE 0.9 % IJ SOLN: 9.0000 mL | INTRAMUSCULAR | Status: DC | PRN

## 2014-04-14 MED ORDER — DEXTROSE-NACL 5-0.45 % IV SOLN
INTRAVENOUS | Status: DC
  Administered 2014-04-14: 10:00:00 via INTRAVENOUS

## 2014-04-14 MED ORDER — HYDROMORPHONE HCL 2 MG/ML IJ SOLN
2.0000 mg | Freq: Once | INTRAMUSCULAR | Status: AC
  Administered 2014-04-14: 2 mg via INTRAVENOUS
  Filled 2014-04-14: qty 1

## 2014-04-14 MED ORDER — SODIUM CHLORIDE 0.9 % IV SOLN
12.5000 mg | Freq: Four times a day (QID) | INTRAVENOUS | Status: DC | PRN
  Filled 2014-04-14: qty 0.25

## 2014-04-14 MED ORDER — NALOXONE HCL 0.4 MG/ML IJ SOLN: 0.4000 mg | INTRAMUSCULAR | Status: DC | PRN

## 2014-04-14 MED ORDER — HYDROMORPHONE HCL 2 MG/ML IJ SOLN
1.5000 mg | Freq: Once | INTRAMUSCULAR | Status: AC
  Administered 2014-04-14: 1.5 mg via INTRAVENOUS
  Filled 2014-04-14: qty 1

## 2014-04-14 MED ORDER — DIPHENHYDRAMINE HCL 12.5 MG/5ML PO ELIX
12.5000 mg | ORAL_SOLUTION | Freq: Four times a day (QID) | ORAL | Status: DC | PRN
  Administered 2014-04-14: 12.5 mg via ORAL
  Filled 2014-04-14: qty 5

## 2014-04-14 MED ORDER — KETOROLAC TROMETHAMINE 30 MG/ML IJ SOLN
30.0000 mg | Freq: Four times a day (QID) | INTRAMUSCULAR | Status: DC
  Administered 2014-04-14: 30 mg via INTRAVENOUS
  Filled 2014-04-14: qty 1

## 2014-04-14 MED ORDER — SODIUM CHLORIDE 0.9 % IJ SOLN: 10.0000 mL | INTRAMUSCULAR | Status: DC | PRN

## 2014-04-14 MED ORDER — ONDANSETRON HCL 4 MG/2ML IJ SOLN: 4.0000 mg | Freq: Four times a day (QID) | INTRAMUSCULAR | Status: DC | PRN

## 2014-04-14 NOTE — Discharge Summary (Signed)
Physician Discharge Summary  [name] [name] DOB: [birth year] DOA: 04/14/2014  PCP: Gerald A., MD  Admit date: 04/14/2014 Discharge date: 04/14/2014  Discharge Diagnoses:  Active Problems:   Sickle cell crisis   Discharge Condition: stable  Disposition: home   Diet:regular  Wt Readings from Last 3 Encounters:  04/03/14 163 lb (73.936 kg)  03/24/14 153 lb 1.6 oz (69.446 kg)  03/11/14 160 lb 15 oz (73 kg)    History of present illness:  [name] is a 34yo male with sickle cell disease who presents to the day hospital with pain in his right ribs that he rates an 8/10. He stated that the rib pain started yesterday. He tried taking his home Dilaudid but did not find much relief. Pt denies other symptoms such as cough, CP, and dyspnea. quit. He denies HA, dizziness, fever, SOB, weakness, vomiting, and diarrhea.   Hospital Course:  Pt was treated initially with weight based rapid re-dosing Dilaudid, IVF, and Toradol. His pain was at a 7/10, so he was then transitioned to a weight based Dilaudid PCA . Labs were checked and her Hgb was stable around baseline. Patient was re-evaluated and appeared improved and rated his pain as a 4/10. Thus he was discharged home, to continue home pain management.  Discharge Exam:  Filed Vitals:   04/14/14 1425  BP: 125/70  Pulse: 103  Temp:   Resp: 21   Filed Vitals:   04/14/14 1136 04/14/14 1237 04/14/14 1328 04/14/14 1425  BP: 118/86 114/76 109/66 125/70  Pulse: 77 78 83 103  Temp: 98.1 F (36.7 C) 98.3 F (36.8 C) 97.9 F (36.6 C)   TempSrc: Oral Oral Oral   Resp: 15 15 15 21   SpO2: 99% 98% 97% 95%   General: Alert, awake, oriented x3, in no acute distress.  HEENT: [place]/AT PEER[place], EOMI Neck: Trachea midline,  no masses, no thyromegal,y no JVD, no carotid bruit OROPHARYNX:  Moist, No exudate/ erythema/lesions.  Heart: Regular rate and rhythm, without murmurs, rubs, gallops Lungs: Clear to auscultation, no  wheezing or rhonchi noted. No increased vocal fremitus resonant to percussion  Abdomen: Soft, nontender, nondistended, positive bowel sounds, no masses no hepatosplenomegaly noted..  Neuro: No focal neurological deficits noted cranial nerves II through XII grossly intact.  Strength 5 out of 5 in bilateral upper and lower extremities. Musculoskeletal: No warm swelling or erythema around joints, no spinal tenderness noted. Psychiatric: Patient alert and oriented x3    Discharge Instructions As above    Medication List    TAKE these medications        diltiazem 120 MG 24 hr capsule  Commonly known as:  CARDIZEM CD  Take 1 capsule (120 mg total) by mouth daily.     folic acid 1 MG tablet  Commonly known as:  FOLVITE  Take 1 tablet (1 mg total) by mouth every morning.     HYDROmorphone 4 MG tablet  Commonly known as:  DILAUDID  Take 1 tablet (4 mg total) by mouth every 4 (four) hours as needed for severe pain.     hydroxyurea 500 MG capsule  Commonly known as:  HYDREA  Take 3 capsules (1,500 mg total) by mouth daily. May take with food to minimize GI side effects.     lisinopril 10 MG tablet  Commonly known as:  PRINIVIL,ZESTRIL  Take 1 tablet (10 mg total) by mouth daily.     metoprolol tartrate 25 MG tablet  Commonly known as:  LOPRESSOR  Take  1 tablet (25 mg total) by mouth daily.     morphine 30 MG 12 hr tablet  Commonly known as:  MS CONTIN  Take 1 tablet (30 mg total) by mouth every 12 (twelve) hours.     potassium chloride SA 20 MEQ tablet  Commonly known as:  K-DUR,KLOR-CON  Take 1 tablet (20 mEq total) by mouth every morning.     rivaroxaban 20 MG Tabs tablet  Commonly known as:  XARELTO  Take 1 tablet (20 mg total) by mouth every morning.     Vitamin D 2000 UNITS tablet  Take 1 tablet (2,000 Units total) by mouth daily.     zolpidem 10 MG tablet  Commonly known as:  AMBIEN  Take 1 tablet (10 mg total) by mouth at bedtime as needed for sleep.           The results of significant diagnostics from this hospitalization (including imaging, microbiology, ancillary and laboratory) are listed below for reference.    Significant Diagnostic Studies: Dg Chest 2 View  04/06/2014   CLINICAL DATA:  LEFT-sided chest pain. Rib pain. Symptoms for 1 week. Sickle cell anemia.  EXAM: CHEST  2 VIEW  COMPARISON:  03/31/2014.  FINDINGS: LEFT subclavian power port unchanged. Cardiopericardial silhouette is enlarged but unchanged compared to prior. There is no airspace consolidation to suggest acute chest syndrome. No pleural effusions. Chronic pulmonary parenchymal scarring associated with sickle cell disease. Bony thoracic spine sickle cell changes better seen on prior CT than on today's radiograph. Enlargement of the pulmonary hila compatible with secondary pulmonary hypertension.  IMPRESSION: Chronic sickle cell changes without findings of acute chest syndrome.   Electronically Signed   By: Dereck Ligas M.D.   On: 04/06/2014 08:13   Dg Chest 2 View  03/31/2014   CLINICAL DATA:  Chest pain and shortness of breath. Sickle cell crisis.  EXAM: CHEST  2 VIEW  COMPARISON:  Chest x-ray dated 03/16/2014 and chest CT 03/19/2014  FINDINGS: Chronic cardiomegaly. Pulmonary vascularity is within normal limits. Power port in place. Chronic accentuation of the interstitial markings. No acute infiltrates.  IMPRESSION: Chronic cardiomegaly.  Stable scarring in the lungs.   Electronically Signed   By: Lorriane Shire M.D.   On: 03/31/2014 12:29   Dg Chest 2 View  03/16/2014   CLINICAL DATA:  Shortness of breath and chest pain. Sickle cell patient.  EXAM: CHEST  2 VIEW  COMPARISON:  03/14/2014  FINDINGS: Left anterior chest wall Port-A-Cath is present with tip projecting over the superior cavoatrial junction. Stable cardiomegaly. No consolidative pulmonary opacities. No pleural effusion or pneumothorax.  IMPRESSION: No acute cardiopulmonary process.   Electronically Signed   By:  Lovey Newcomer M.D.   On: 03/16/2014 15:14   Ct Head Wo Contrast  03/18/2014   CLINICAL DATA:  Left upper extremity acute weakness, dizziness, slurred speech with expressive dysphasia. History of the sickle cell disease.  EXAM: CT HEAD WITHOUT CONTRAST  TECHNIQUE: Contiguous axial images were obtained from the base of the skull through the vertex without contrast.  COMPARISON:  04/25/2011  FINDINGS: Mild brain atrophy pattern for the patient's young age. Remote caudate lacunar infarct on the left. Periventricular mild chronic white matter microvascular ischemic changes suspected. No acute intracranial hemorrhage, known infarction, mass lesion, midline shift, herniation, hydrocephalus, or extra-axial fluid collection. No focal mass effect or edema. Cisterns patent. Remote right cerebellar lacunar type infarct. Mild cerebellar atrophy as well.  Mastoids and sinuses remain clear.  No acute osseous finding.  IMPRESSION: No acute intracranial finding.  Remote left caudate and cerebellar lacunar-type infarcts.  Atrophy and chronic white matter microvascular ischemic changes.   Electronically Signed   By: Jerilynn Mages.  Shick M.D.   On: 03/18/2014 15:38   Ct Chest W Contrast  03/19/2014   CLINICAL DATA:  Short of breath. Fever since last night. Pulmonary embolism. Acute on chronic pulmonary embolism.  EXAM: CT CHEST WITH CONTRAST  TECHNIQUE: Multidetector CT imaging of the chest was performed during intravenous contrast administration.  CONTRAST:  109mL OMNIPAQUE IOHEXOL 300 MG/ML  SOLN  COMPARISON:  03/11/2013.  FINDINGS: Musculoskeletal: Sickle cell bony changes are present throughout the thoracic spine with a H shaped vertebral bodies. 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  Lungs: There is a peripheral mosaic attenuation pattern consistent with acute chest syndrome. The differential considerations are pulmonary edema, small vessel infarcts with infection or interstitial lung  disease unlikely.  Central airways: Patent.  Vasculature: Grossly within normal limits. LEFT subclavian Port-A-Cath terminating in the RIGHT atrium. Cardiomegaly is present. This is a chronic finding and appears similar to the prior CT and radiographs.  Effusions: Tiny bilateral pleural effusions layering dependently. Small pericardial effusion.  Lymphadenopathy: Unchanged prevascular lymphadenopathy. No axillary adenopathy.  Esophagus: Normal.  Upper abdomen: Periportal edema is present. Splenic auto infarction.  Other: None.  IMPRESSION: 1. Scattered areas of ground-glass attenuation throughout the lungs bilaterally. Findings are compatible with acute chest syndrome in a patient with sickle cell disease. General differential considerations in acute chest include pulmonary edema, pulmonary hemorrhage, infection/inflammation and fat embolism. Given other findings, fat embolism and pulmonary edema are the most likely considerations. Unchanged cardiomegaly. 2. Small bilateral pleural effusions. Small pericardial effusion and cardiomegaly. 3. Stable prevascular mediastinal adenopathy.   Electronically Signed   By: Dereck Ligas M.D.   On: 03/19/2014 13:24    Microbiology: No results found for this or any previous visit (from the past 240 hour(s)).   Labs: Basic Metabolic Panel:  Recent Labs Lab 04/09/14 1140 04/14/14 0941  NA 138 137  K 3.9 4.1  CL 109 108  CO2 25 23  GLUCOSE 96 102*  BUN 9 10  CREATININE 0.57 0.55  CALCIUM 8.7 8.8   Liver Function Tests:  Recent Labs Lab 04/09/14 1140 04/14/14 0941  AST 34 37  ALT 28 24  ALKPHOS 80 85  BILITOT 3.3* 4.7*  PROT 7.2 7.1  ALBUMIN 3.7 4.0   No results for input(s): LIPASE, AMYLASE in the last 168 hours. No results for input(s): AMMONIA in the last 168 hours. CBC:  Recent Labs Lab 04/09/14 1140 04/14/14 0941  WBC 13.8* 9.4  NEUTROABS 10.8* 7.0  HGB 7.8* 7.2*  HCT 24.1* 21.3*  MCV 94.1 95.1  PLT 402* 335    Time total:>55  min  Signed:  Kalman Shan  04/14/2014, 2:30 PM

## 2014-04-14 NOTE — H&P (Signed)
[facility] History and Physical  [name] JME:[id number] DOB: 06-08-1980 DOA: 04/14/2014   PCP: [name],[name], MD   Chief Complaint: pain  [id number] [name] is a 34yo male with sickle cell disease who presents to the day hospital with pain in his right ribs that he rates an 8/10. He stated that the rib pain started yesterday. He tried taking his home Dilaudid but did not find much relief. Pt denies other symptoms such as cough, CP, and dyspnea. quit. He denies HA, dizziness, fever, SOB, weakness, vomiting, and diarrhea.   Review of Systems:  Constitutional: No weight loss, night sweats, fatigue. No fevers or chills HEENT: No headaches, dizziness, seizures, vision changes, difficulty swallowing,Tooth/dental problems,Sore throat, No sneezing, itching, ear ache, nasal congestion, post nasal drip,  Cardio-vascular: No chest pain, Orthopnea, PND, swelling in lower extremities, anasarca, dizziness, palpitations  GI: No heartburn, indigestion, abdominal pain, nausea, vomiting, diarrhea, change in bowel habits, loss of appetite  Resp: No shortness of breath with exertion or at rest. No excess mucus, no productive cough. No coughing up of blood.No change in color of mucus.No wheezing.No chest wall deformity  Skin: no rash or lesions.  GU: no dysuria, change in color of urine, no urgency or frequency. No flank pain.  Musculoskeletal: No joint pain or swelling. No decreased range of motion. No back pain. +rib pain Psych: No change in mood or affect. No depression or anxiety. No memory loss.    Past Medical History  Diagnosis Date  . Sickle cell anemia   . Blood transfusion   . Acute embolism and thrombosis of right internal jugular vein   . Hypokalemia   . Mood disorder   . History of pulmonary embolus (PE)   . Avascular necrosis   . Leukocytosis     Chronic  . Thrombocytosis     Chronic  . Hypertension   . History of Clostridium difficile infection   .  Uses marijuana   . Chronic anticoagulation   . Functional asplenia   . Former smoker   . Second hand tobacco smoke exposure   . Alcohol consumption of one to four drinks per day   . Noncompliance with medication regimen   . Sickle-cell crisis with associated acute chest syndrome 05/13/2013  . Acute chest syndrome 06/18/2013  . Demand ischemia 01/02/2014   Past Surgical History  Procedure Laterality Date  . Right hip replacement      08/2006  . Cholecystectomy      01/2008  . Porta cath placement    . Porta cath removal    . Umbilical hernia repair      01/2008  . Excision of left periauricular cyst      10/2009  . Excision of right ear lobe cyst with primary closur      11/2007  . Portacath placement  01/05/2012    Procedure: INSERTION PORT-A-CATH;  Surgeon: Odis Hollingshead, MD;  Location: [facility];  Service: General;  Laterality: N/A;  ultrasound guiced port a cath insertion with fluoroscopy   Social History:  reports that he quit smoking about 3 years ago. He has never used smokeless tobacco. He reports that he drinks about 1.2 oz of alcohol per week. He reports that he uses illicit drugs (Marijuana) about twice per week.  No Known Allergies  Family History  Problem Relation Age of Onset  . Sickle cell trait Mother   . Depression Mother   . Diabetes Mother   . Sickle cell trait  Father   . Sickle cell trait Brother    No current facility-administered medications on file prior to encounter.   Current Outpatient Prescriptions on File Prior to Encounter  Medication Sig Dispense Refill  . Cholecalciferol (VITAMIN D) 2000 UNITS tablet Take 1 tablet (2,000 Units total) by mouth daily. 90 tablet 3  . diltiazem (CARDIZEM CD) 120 MG 24 hr capsule Take 1 capsule (120 mg total) by mouth daily. 30 capsule 1  . folic acid (FOLVITE) 1 MG tablet Take 1 tablet (1 mg total) by mouth every morning. 30 tablet 11  . HYDROmorphone (DILAUDID) 4 MG tablet Take 1 tablet (4 mg total) by mouth every 4  (four) hours as needed for severe pain. 90 tablet 0  . hydroxyurea (HYDREA) 500 MG capsule Take 3 capsules (1,500 mg total) by mouth daily. May take with food to minimize GI side effects. 60 capsule 3  . lisinopril (PRINIVIL,ZESTRIL) 10 MG tablet Take 1 tablet (10 mg total) by mouth daily. 30 tablet 1  . metoprolol tartrate (LOPRESSOR) 25 MG tablet Take 1 tablet (25 mg total) by mouth daily. 30 tablet 6  . morphine (MS CONTIN) 30 MG 12 hr tablet Take 1 tablet (30 mg total) by mouth every 12 (twelve) hours. 60 tablet 0  . potassium chloride SA (K-DUR,KLOR-CON) 20 MEQ tablet Take 1 tablet (20 mEq total) by mouth every morning. 30 tablet 3  . rivaroxaban (XARELTO) 20 MG TABS tablet Take 1 tablet (20 mg total) by mouth every morning. 30 tablet 2  . zolpidem (AMBIEN) 10 MG tablet Take 1 tablet (10 mg total) by mouth at bedtime as needed for sleep. 30 tablet 3      Physical Exam: Filed Vitals:   04/14/14 1131 04/14/14 1136 04/14/14 1237 04/14/14 1328  BP:  118/86 114/76 109/66  Pulse:  77 78 83  Temp:  98.1 F (36.7 C) 98.3 F (36.8 C) 97.9 F (36.6 C)  TempSrc:  Oral Oral Oral  Resp: 17 15 15 15   SpO2: 99% 99% 98% 97%   General: Alert, awake, oriented x3, in no acute distress.  HEENT: [place]/AT PEERL, EOMI Neck: Trachea midline,  no masses, no thyromegal,y no JVD, no carotid bruit OROPHARYNX:  Moist, No exudate/ erythema/lesions.  Heart: Regular rate and rhythm, without murmurs, rubs, gallops, PMI non-displaced, no heaves or thrills on palpation.  Lungs: Clear to auscultation, no wheezing or rhonchi noted. No increased vocal fremitus resonant to percussion  Abdomen: Soft, nontender, nondistended, positive bowel sounds, no masses no hepatosplenomegaly noted..  Neuro: No focal neurological deficits noted cranial nerves II through XII grossly intact. MSK: no warmth, swelling, or erythema Extremities: mild clubbing of fingers, no cyanosis, or edema.   Labs on Admission:   Basic Metabolic  Panel:  Recent Labs Lab 04/09/14 1140 04/14/14 0941  NA 138 137  K 3.9 4.1  CL 109 108  CO2 25 23  GLUCOSE 96 102*  BUN 9 10  CREATININE 0.57 0.55  CALCIUM 8.7 8.8   Liver Function Tests:  Recent Labs Lab 04/09/14 1140 04/14/14 0941  AST 34 37  ALT 28 24  ALKPHOS 80 85  BILITOT 3.3* 4.7*  PROT 7.2 7.1  ALBUMIN 3.7 4.0   CBC:  Recent Labs Lab 04/09/14 1140 04/14/14 0941  WBC 13.8* 9.4  NEUTROABS 10.8* 7.0  HGB 7.8* 7.2*  HCT 24.1* 21.3*  MCV 94.1 95.1  PLT 402* 335   BNP: Invalid input(s): POCBNP   Radiological Exams on Admission: No results found.  Assessment/Plan: Active Problems:   Sickle cell crisis Pt will be treated with initially treated with weight based rapid re-dosing Dilaudid, IVF and Toradol . If pain is above 7/10 then Pt will be transitioned to a weight based Dilaudid PCA . Patient will be re-evaluated for pain in the context of function and relationship to baseline as care progresses.  Time spend: 40 min Code Status: full Family Communication:  none Disposition Plan: pending improvement  Kalman Shan, MD  Pager [phone]  If 7PM-7AM, please contact night-coverage [email or web address] Password [facility] 04/14/2014, 2:14 PM

## 2014-04-14 NOTE — Progress Notes (Addendum)
Patient pain goal of 4 met. Patient received discharge orders. Patient port de accessed. Patient spoke with Randell Patient about refill needs.

## 2014-04-14 NOTE — Telephone Encounter (Signed)
Refill request for MS Contin 30mg  LOV 02/13/2014. Please advise. Thanks!

## 2014-04-14 NOTE — Telephone Encounter (Signed)
Pt called and states experiencing pain in ribs; states pain 8/10; denies CP, SOB, fever, nausea, vomiting, diarrhea, or priapism; states no relief from home medications; MD notified;

## 2014-04-14 NOTE — Telephone Encounter (Signed)
Called pt and notified him that, per MD, he may come to day hospital for evaluation; pt verbalizes understanding

## 2014-04-15 DIAGNOSIS — R42 Dizziness and giddiness: Secondary | ICD-10-CM | POA: Diagnosis not present

## 2014-04-15 DIAGNOSIS — R55 Syncope and collapse: Secondary | ICD-10-CM | POA: Diagnosis not present

## 2014-04-15 DIAGNOSIS — I10 Essential (primary) hypertension: Secondary | ICD-10-CM | POA: Diagnosis not present

## 2014-04-15 DIAGNOSIS — G8929 Other chronic pain: Secondary | ICD-10-CM | POA: Diagnosis not present

## 2014-04-15 DIAGNOSIS — I272 Other secondary pulmonary hypertension: Secondary | ICD-10-CM | POA: Diagnosis not present

## 2014-04-15 DIAGNOSIS — Z86711 Personal history of pulmonary embolism: Secondary | ICD-10-CM | POA: Diagnosis not present

## 2014-04-15 DIAGNOSIS — D571 Sickle-cell disease without crisis: Secondary | ICD-10-CM | POA: Diagnosis not present

## 2014-04-15 MED ORDER — MORPHINE SULFATE ER 30 MG PO TBCR: 30.0000 mg | EXTENDED_RELEASE_TABLET | Freq: Two times a day (BID) | ORAL | Status: DC

## 2014-04-15 NOTE — Telephone Encounter (Signed)
Prescription written for MS Contin 30 mg #60 tabs. NCCSRS reviewed and no inconsistencies noted.

## 2014-04-17 ENCOUNTER — Encounter: Payer: Self-pay | Admitting: Internal Medicine

## 2014-04-17 NOTE — Progress Notes (Signed)
Patient ID: LANGLEY INGALLS, male   DOB: 1980/07/07, 34 y.o.   MRN: [id number] I received a call from Dr. Felisa Bonier who saw Mr. Hackman in the [facility] on 04/14/2104. He requests that a repeat ECHO be performed in 2 months and results sentn to him. He plans on seeing Mr. Millea back in the office in 3 months.

## 2014-04-20 ENCOUNTER — Emergency Department (HOSPITAL_COMMUNITY)
Admission: EM | Admit: 2014-04-20 | Discharge: 2014-04-20 | Disposition: A | Discharge status: Home / Self Care | Payer: Medicare Other | Attending: Emergency Medicine | Admitting: Emergency Medicine

## 2014-04-20 ENCOUNTER — Encounter (HOSPITAL_COMMUNITY): Payer: Self-pay | Admitting: *Deleted

## 2014-04-20 DIAGNOSIS — D649 Anemia, unspecified: Secondary | ICD-10-CM | POA: Insufficient documentation

## 2014-04-20 DIAGNOSIS — Q8901 Asplenia (congenital): Secondary | ICD-10-CM | POA: Diagnosis not present

## 2014-04-20 DIAGNOSIS — Z87891 Personal history of nicotine dependence: Secondary | ICD-10-CM | POA: Diagnosis not present

## 2014-04-20 DIAGNOSIS — Z8619 Personal history of other infectious and parasitic diseases: Secondary | ICD-10-CM | POA: Insufficient documentation

## 2014-04-20 DIAGNOSIS — E876 Hypokalemia: Secondary | ICD-10-CM | POA: Insufficient documentation

## 2014-04-20 DIAGNOSIS — Z792 Long term (current) use of antibiotics: Secondary | ICD-10-CM | POA: Diagnosis not present

## 2014-04-20 DIAGNOSIS — Z86711 Personal history of pulmonary embolism: Secondary | ICD-10-CM | POA: Diagnosis not present

## 2014-04-20 DIAGNOSIS — Z8659 Personal history of other mental and behavioral disorders: Secondary | ICD-10-CM | POA: Insufficient documentation

## 2014-04-20 DIAGNOSIS — I10 Essential (primary) hypertension: Secondary | ICD-10-CM | POA: Insufficient documentation

## 2014-04-20 DIAGNOSIS — Z8739 Personal history of other diseases of the musculoskeletal system and connective tissue: Secondary | ICD-10-CM | POA: Insufficient documentation

## 2014-04-20 DIAGNOSIS — Z86718 Personal history of other venous thrombosis and embolism: Secondary | ICD-10-CM | POA: Insufficient documentation

## 2014-04-20 DIAGNOSIS — D5700 Hb-SS disease with crisis, unspecified: Secondary | ICD-10-CM | POA: Diagnosis not present

## 2014-04-20 DIAGNOSIS — Z79899 Other long term (current) drug therapy: Secondary | ICD-10-CM | POA: Diagnosis not present

## 2014-04-20 DIAGNOSIS — Z9119 Patient's noncompliance with other medical treatment and regimen: Secondary | ICD-10-CM | POA: Diagnosis not present

## 2014-04-20 LAB — CBC WITH DIFFERENTIAL/PLATELET
Basophils Absolute: 0.1 10*3/uL (ref 0.0–0.1)
Basophils Relative: 1 % (ref 0–1)
EOS PCT: 3 % (ref 0–5)
Eosinophils Absolute: 0.4 10*3/uL (ref 0.0–0.7)
HCT: 19.9 % — ABNORMAL LOW (ref 39.0–52.0)
HEMOGLOBIN: 6.7 g/dL — AB (ref 13.0–17.0)
LYMPHS PCT: 20 % (ref 12–46)
Lymphs Abs: 2.6 10*3/uL (ref 0.7–4.0)
MCH: 31.3 pg (ref 26.0–34.0)
MCHC: 33.7 g/dL (ref 30.0–36.0)
MCV: 93 fL (ref 78.0–100.0)
MONOS PCT: 13 % — AB (ref 3–12)
Monocytes Absolute: 1.7 10*3/uL — ABNORMAL HIGH (ref 0.1–1.0)
NEUTROS PCT: 63 % (ref 43–77)
Neutro Abs: 8.3 10*3/uL — ABNORMAL HIGH (ref 1.7–7.7)
Platelets: 288 10*3/uL (ref 150–400)
RBC: 2.14 MIL/uL — ABNORMAL LOW (ref 4.22–5.81)
RDW: 23.4 % — ABNORMAL HIGH (ref 11.5–15.5)
WBC: 13.1 10*3/uL — ABNORMAL HIGH (ref 4.0–10.5)

## 2014-04-20 LAB — BASIC METABOLIC PANEL
Anion gap: 8 (ref 5–15)
BUN: 18 mg/dL (ref 6–23)
CO2: 23 mmol/L (ref 19–32)
CREATININE: 0.73 mg/dL (ref 0.50–1.35)
Calcium: 8.9 mg/dL (ref 8.4–10.5)
Chloride: 108 mmol/L (ref 96–112)
GFR calc Af Amer: >90 mL/min (ref >90)
Glucose, Bld: 100 mg/dL — ABNORMAL HIGH (ref 70–99)
Potassium: 4.2 mmol/L (ref 3.5–5.1)
Sodium: 139 mmol/L (ref 135–145)

## 2014-04-20 LAB — RETICULOCYTES
RBC.: 2.14 MIL/uL — AB (ref 4.22–5.81)
RETIC CT PCT: 6.1 % — AB (ref 0.4–3.1)
Retic Count, Absolute: 130.5 10*3/uL (ref 19.0–186.0)

## 2014-04-20 MED ORDER — DIPHENHYDRAMINE HCL 50 MG/ML IJ SOLN
25.0000 mg | Freq: Once | INTRAMUSCULAR | Status: AC
  Administered 2014-04-20: 25 mg via INTRAVENOUS
  Filled 2014-04-20: qty 1

## 2014-04-20 MED ORDER — HEPARIN SOD (PORK) LOCK FLUSH 100 UNIT/ML IV SOLN
500.0000 [IU] | Freq: Once | INTRAVENOUS | Status: AC
  Administered 2014-04-20: 500 [IU]
  Filled 2014-04-20: qty 5

## 2014-04-20 MED ORDER — HYDROMORPHONE HCL 2 MG/ML IJ SOLN: 2.0000 mg | Freq: Once | INTRAMUSCULAR | Status: DC

## 2014-04-20 MED ORDER — HYDROMORPHONE HCL 2 MG/ML IJ SOLN
2.0000 mg | INTRAMUSCULAR | Status: DC | PRN
  Administered 2014-04-20 (×3): 2 mg via INTRAVENOUS
  Filled 2014-04-20 (×3): qty 1

## 2014-04-20 NOTE — ED Notes (Signed)
Pt escorted to discharge window. Pt verbalized understanding discharge instructions. In no acute distress.  

## 2014-04-20 NOTE — ED Notes (Addendum)
Pt reports sickle cell pain crisis starting this morning, bil side pain and back pain, pain 8/10. Denies SOB, denies chest pain. Able to speak in full sentences. Pt took 4mg  dilaudid at 0600.

## 2014-04-20 NOTE — ED Notes (Signed)
md at bedside

## 2014-04-20 NOTE — ED Provider Notes (Signed)
CSN: [id number]     Arrival date & time 04/20/14  4235 History   First MD Initiated Contact with Patient 04/20/14 [phone]     Chief Complaint  Patient presents with  . Sickle Cell Pain Crisis     HPI Patient reports pain consistent with his typical sickle cell crisis pain despite home pain medications.  He reports pain in his left back and left flank region.  No urinary complaints.  Denies nausea vomiting diarrhea.  No fevers or chills.  Symptoms are moderate to severe in severity.  No chest pain or shortness of breath.  Denies abdominal pain.   Past Medical History  Diagnosis Date  . Sickle cell anemia   . Blood transfusion   . Acute embolism and thrombosis of right internal jugular vein   . Hypokalemia   . Mood disorder   . History of pulmonary embolus (PE)   . Avascular necrosis   . Leukocytosis     Chronic  . Thrombocytosis     Chronic  . Hypertension   . History of Clostridium difficile infection   . Uses marijuana   . Chronic anticoagulation   . Functional asplenia   . Former smoker   . Second hand tobacco smoke exposure   . Alcohol consumption of one to four drinks per day   . Noncompliance with medication regimen   . Sickle-cell crisis with associated acute chest syndrome 05/13/2013  . Acute chest syndrome 06/18/2013  . Demand ischemia 01/02/2014   Past Surgical History  Procedure Laterality Date  . Right hip replacement      08/2006  . Cholecystectomy      01/2008  . Porta cath placement    . Porta cath removal    . Umbilical hernia repair      01/2008  . Excision of left periauricular cyst      10/2009  . Excision of right ear lobe cyst with primary closur      11/2007  . Portacath placement  01/05/2012    Procedure: INSERTION PORT-A-CATH;  Surgeon: Odis Hollingshead, MD;  Location: [facility];  Service: General;  Laterality: N/A;  ultrasound guiced port a cath insertion with fluoroscopy   Family History  Problem Relation Age of Onset  . Sickle cell trait Mother    . Depression Mother   . Diabetes Mother   . Sickle cell trait Father   . Sickle cell trait Brother    History  Substance Use Topics  . Smoking status: Former Smoker -- 13 years    Quit date: 07/08/2010  . Smokeless tobacco: Never Used  . Alcohol Use: 1.2 oz/week    2 Shots of liquor per week    Review of Systems  All other systems reviewed and are negative.     Allergies  Review of patient's allergies indicates no known allergies.  Home Medications   Prior to Admission medications   Medication Sig Start Date End Date Taking? Authorizing Provider  amoxicillin-clavulanate (AUGMENTIN) 875-125 MG per tablet Take 1 tablet by mouth daily.  03/24/14  Yes Historical Provider, MD  Cholecalciferol (VITAMIN D) 2000 UNITS tablet Take 1 tablet (2,000 Units total) by mouth daily. 02/13/14  Yes [name], MD  diltiazem (CARDIZEM CD) 120 MG 24 hr capsule Take 1 capsule (120 mg total) by mouth daily. 03/13/14  Yes Costin Karlyne Greenspan, MD  folic acid (FOLVITE) 1 MG tablet Take 1 tablet (1 mg total) by mouth every morning. 10/08/13  Yes [name],  MD  HYDROmorphone (DILAUDID) 4 MG tablet Take 1 tablet (4 mg total) by mouth every 4 (four) hours as needed for severe pain. 04/03/14  Yes [name], MD  hydroxyurea (HYDREA) 500 MG capsule Take 3 capsules (1,500 mg total) by mouth daily. May take with food to minimize GI side effects. 10/30/13  Yes [name], MD  lisinopril (PRINIVIL,ZESTRIL) 10 MG tablet Take 1 tablet (10 mg total) by mouth daily. 01/22/14  Yes [name], MD  metoprolol tartrate (LOPRESSOR) 25 MG tablet Take 1 tablet (25 mg total) by mouth daily. 08/22/13  Yes [name], MD  morphine (MS CONTIN) 30 MG 12 hr tablet Take 1 tablet (30 mg total) by mouth every 12 (twelve) hours. 04/15/14  Yes [name], MD  potassium chloride SA (K-DUR,KLOR-CON) 20 MEQ tablet Take 1 tablet (20 mEq total) by mouth every morning. 09/25/13  Yes [name], MD  rivaroxaban (XARELTO) 20 MG TABS tablet Take 1 tablet (20 mg total) by mouth every morning. 03/13/14  Yes Costin Karlyne Greenspan, MD  zolpidem (AMBIEN) 10 MG tablet Take 1 tablet (10 mg total) by mouth at bedtime as needed for sleep. 04/01/14  Yes [name], MD   BP 115/69 mmHg  Pulse 81  Temp(Src) 98.8 F (37.1 C) (Oral)  Resp 17  SpO2 98% Physical Exam  Constitutional: He is oriented to person, place, and time. He appears well-developed and well-nourished.  HENT:  Head: Normocephalic and atraumatic.  Eyes: EOM are normal.  Neck: Normal range of motion.  Cardiovascular: Normal rate, regular rhythm, normal heart sounds and intact distal pulses.   Pulmonary/Chest: Effort normal and breath sounds normal. No respiratory distress.  Abdominal: Soft. He exhibits no distension. There is no tenderness.  Musculoskeletal: Normal range of motion.  Neurological: He is alert and oriented to person, place, and time.  Skin: Skin is warm and dry.  Psychiatric: He has a normal mood and affect. Judgment normal.  Nursing note and vitals reviewed.   ED Course  Procedures (including critical care time) Labs Review Labs Reviewed  CBC WITH DIFFERENTIAL/PLATELET - Abnormal; Notable for the following:    WBC 13.1 (*)    RBC 2.14 (*)    Hemoglobin 6.7 (*)    HCT 19.9 (*)    RDW 23.4 (*)    Monocytes Relative 13 (*)    Neutro Abs 8.3 (*)    Monocytes Absolute 1.7 (*)    All other components within normal limits  BASIC METABOLIC PANEL - Abnormal; Notable for the following:    Glucose, Bld 100 (*)    All other components within normal limits  RETICULOCYTES - Abnormal; Notable for the following:    Retic Ct Pct 6.1 (*)    RBC. 2.14 (*)    All other components within normal limits  TYPE AND SCREEN    Imaging Review No results found.   EKG Interpretation None      MDM   Final diagnoses:  Sickle cell disease with crisis  Anemia, unspecified anemia type    12:59  PM Patient's pain control this time.  Vital signs are normal.  Patient with anemia which is slightly worse than before but he has been this low before and not had a transient in by his primary team.  Will withhold transfusion at this time.  Vas that he follow-up with his sickle cell team    Jola Schmidt, MD 04/20/14 1301

## 2014-04-20 NOTE — ED Notes (Addendum)
[name] from Lab reports a critical hemoglobin of 6.7.  MD [facility] made aware.

## 2014-04-20 NOTE — Discharge Instructions (Signed)
Sickle Cell Anemia, Adult °Sickle cell anemia is a condition in which red blood cells have an abnormal "sickle" shape. This abnormal shape shortens the cells' life span, which results in a lower than normal concentration of red blood cells in the blood. The sickle shape also causes the cells to clump together and block free blood flow through the blood vessels. As a result, the tissues and organs of the body do not receive enough oxygen. Sickle cell anemia causes organ damage and pain and increases the risk of infection. °CAUSES  °Sickle cell anemia is a genetic disorder. Those who receive two copies of the gene have the condition, and those who receive one copy have the trait. °RISK FACTORS °The sickle cell gene is most common in people whose families originated in [place]. Other areas of the globe where sickle cell trait occurs include the [place], [place] and [place], the [place], and the [place].  °SIGNS AND SYMPTOMS °· Pain, especially in the extremities, back, chest, or abdomen (common). The pain may start suddenly or may develop following an illness, especially if there is dehydration. Pain can also occur due to overexertion or exposure to extreme temperature changes. °· Frequent severe bacterial infections, especially certain types of pneumonia and meningitis. °· Pain and swelling in the hands and feet. °· Decreased activity.   °· Loss of appetite.   °· Change in behavior. °· Headaches. °· Seizures. °· Shortness of breath or difficulty breathing. °· Vision changes. °· Skin ulcers. °Those with the trait may not have symptoms or they may have mild symptoms.  °DIAGNOSIS  °Sickle cell anemia is diagnosed with blood tests that demonstrate the genetic trait. It is often diagnosed during the newborn period, due to mandatory testing nationwide. A variety of blood tests, X-rays, CT scans, MRI scans, ultrasounds, and lung function tests may also be done to monitor the condition. °TREATMENT  °Sickle  cell anemia may be treated with: °· Medicines. You may be given pain medicines, antibiotic medicines (to treat and prevent infections) or medicines to increase the production of certain types of hemoglobin. °· Fluids. °· Oxygen. °· Blood transfusions. °HOME CARE INSTRUCTIONS  °· Drink enough fluid to keep your urine clear or pale yellow. Increase your fluid intake in hot weather and during exercise. °· Do not smoke. Smoking lowers oxygen levels in the blood.   °· Only take over-the-counter or prescription medicines for pain, fever, or discomfort as directed by your health care provider. °· Take antibiotics as directed by your health care provider. Make sure you finish them it even if you start to feel better.   °· Take supplements as directed by your health care provider.   °· Consider wearing a medical alert bracelet. This tells anyone caring for you in an emergency of your condition.   °· When traveling, keep your medical information, health care provider's names, and the medicines you take with you at all times.   °· If you develop a fever, do not take medicines to reduce the fever right away. This could cover up a problem that is developing. Notify your health care provider. °· Keep all follow-up appointments with your health care provider. Sickle cell anemia requires regular medical care. °SEEK MEDICAL CARE IF: ° You have a fever. °SEEK IMMEDIATE MEDICAL CARE IF:  °· You feel dizzy or faint.   °· You have new abdominal pain, especially on the left side near the stomach area.   °· You develop a persistent, often uncomfortable and painful penile erection (priapism). If this is not treated immediately it   will lead to impotence.   °· You have numbness your arms or legs or you have a hard time moving them.   °· You have a hard time with speech.   °· You have a fever or persistent symptoms for more than 2-3 days.   °· You have a fever and your symptoms suddenly get worse.   °· You have signs or symptoms of infection.  These include:   °¨ Chills.   °¨ Abnormal tiredness (lethargy).   °¨ Irritability.   °¨ Poor eating.   °¨ Vomiting.   °· You develop pain that is not helped with medicine.   °· You develop shortness of breath. °· You have pain in your chest.   °· You are coughing up pus-like or bloody sputum.   °· You develop a stiff neck. °· Your feet or hands swell or have pain. °· Your abdomen appears bloated. °· You develop joint pain. °MAKE SURE YOU: °· Understand these instructions. °Document Released: 04/27/2005 Document Revised: 06/03/2013 Document Reviewed: 08/29/2012 °[employer]® Patient Information ©2015 [employer]. This information is not intended to replace advice given to you by your health care provider. Make sure you discuss any questions you have with your health care provider. ° °

## 2014-04-20 NOTE — ED Notes (Signed)
Pt alert and oriented x4. Respirations even and unlabored, bilateral symmetrical rise and fall of chest. Skin warm and dry. In no acute distress. Denies needs.   

## 2014-04-20 NOTE — ED Notes (Signed)
Per md pt allowed to have sandwich

## 2014-04-22 ENCOUNTER — Telehealth (HOSPITAL_COMMUNITY): Payer: Self-pay | Admitting: Hematology

## 2014-04-22 ENCOUNTER — Non-Acute Institutional Stay (HOSPITAL_COMMUNITY)
Admission: AD | Admit: 2014-04-22 | Discharge: 2014-04-22 | Disposition: A | Discharge status: Home / Self Care | Payer: Medicare Other | Attending: Internal Medicine | Admitting: Internal Medicine

## 2014-04-22 ENCOUNTER — Encounter (HOSPITAL_COMMUNITY): Payer: Self-pay | Admitting: Hematology

## 2014-04-22 DIAGNOSIS — Z87891 Personal history of nicotine dependence: Secondary | ICD-10-CM | POA: Insufficient documentation

## 2014-04-22 DIAGNOSIS — D638 Anemia in other chronic diseases classified elsewhere: Secondary | ICD-10-CM | POA: Diagnosis not present

## 2014-04-22 DIAGNOSIS — D5700 Hb-SS disease with crisis, unspecified: Secondary | ICD-10-CM | POA: Diagnosis not present

## 2014-04-22 DIAGNOSIS — Z79899 Other long term (current) drug therapy: Secondary | ICD-10-CM | POA: Insufficient documentation

## 2014-04-22 DIAGNOSIS — R0781 Pleurodynia: Secondary | ICD-10-CM | POA: Diagnosis present

## 2014-04-22 LAB — CBC WITH DIFFERENTIAL/PLATELET
Basophils Absolute: 0.1 10*3/uL (ref 0.0–0.1)
Basophils Relative: 1 % (ref 0–1)
EOS PCT: 3 % (ref 0–5)
Eosinophils Absolute: 0.3 10*3/uL (ref 0.0–0.7)
HCT: 21.9 % — ABNORMAL LOW (ref 39.0–52.0)
Hemoglobin: 7.2 g/dL — ABNORMAL LOW (ref 13.0–17.0)
LYMPHS ABS: 2.9 10*3/uL (ref 0.7–4.0)
Lymphocytes Relative: 25 % (ref 12–46)
MCH: 31.6 pg (ref 26.0–34.0)
MCHC: 32.9 g/dL (ref 30.0–36.0)
MCV: 96.1 fL (ref 78.0–100.0)
MONO ABS: 1.6 10*3/uL — AB (ref 0.1–1.0)
Monocytes Relative: 14 % — ABNORMAL HIGH (ref 3–12)
NEUTROS ABS: 6.5 10*3/uL (ref 1.7–7.7)
NEUTROS PCT: 57 % (ref 43–77)
Platelets: 337 10*3/uL (ref 150–400)
RBC: 2.28 MIL/uL — AB (ref 4.22–5.81)
RDW: 24.1 % — ABNORMAL HIGH (ref 11.5–15.5)
WBC: 11.4 10*3/uL — ABNORMAL HIGH (ref 4.0–10.5)

## 2014-04-22 LAB — RETICULOCYTES
RBC.: 2.28 MIL/uL — ABNORMAL LOW (ref 4.22–5.81)
Retic Count, Absolute: 264.5 10*3/uL — ABNORMAL HIGH (ref 19.0–186.0)
Retic Ct Pct: 11.6 % — ABNORMAL HIGH (ref 0.4–3.1)

## 2014-04-22 IMAGING — CR DG CHEST 2V
2 series · 2 of 2 positions shown · non-contrast
Comparison: 04/17/2011

CLINICAL DATA: Sickle cell disease, body aches

CHEST - 2 VIEW

[w chest pa]
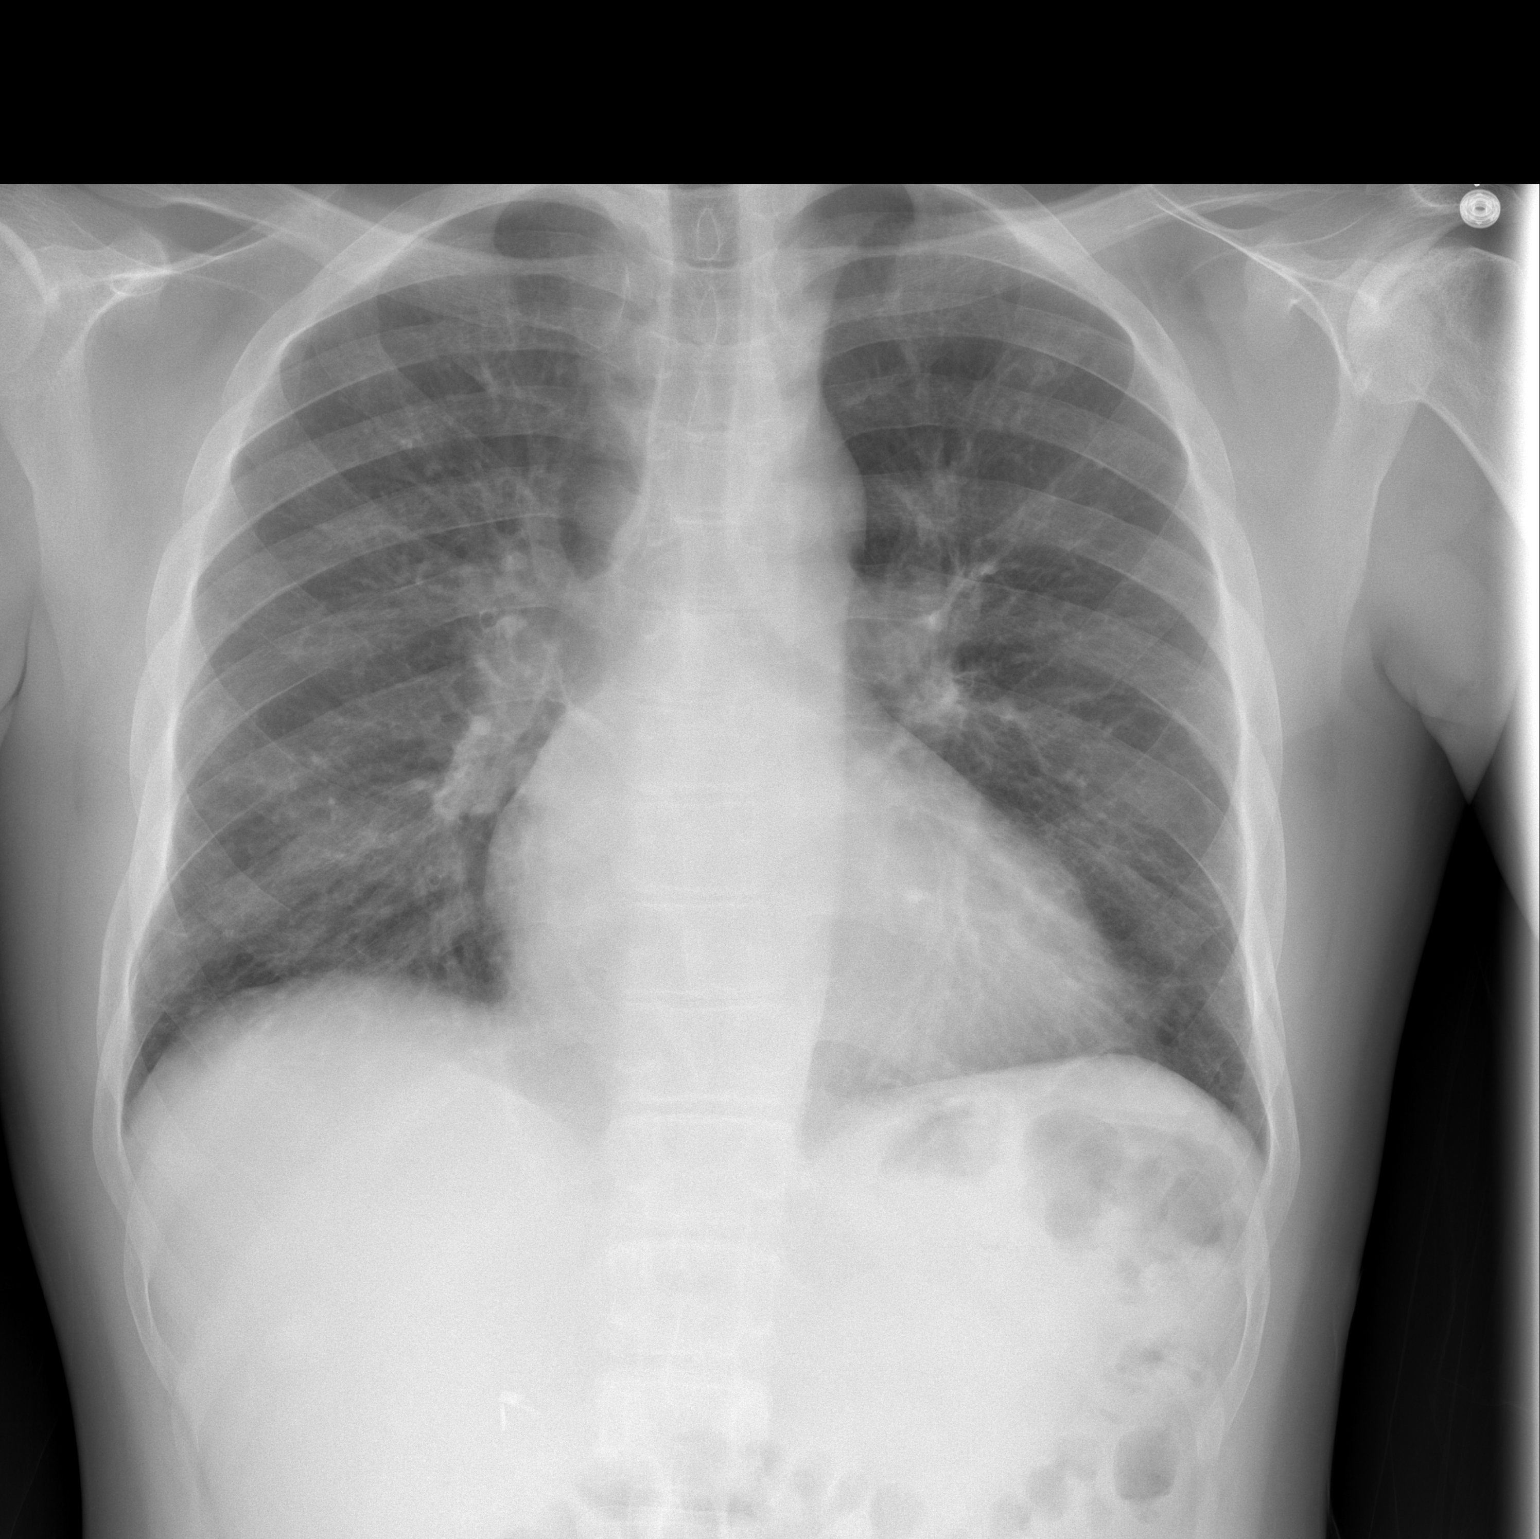

[w chest lat]
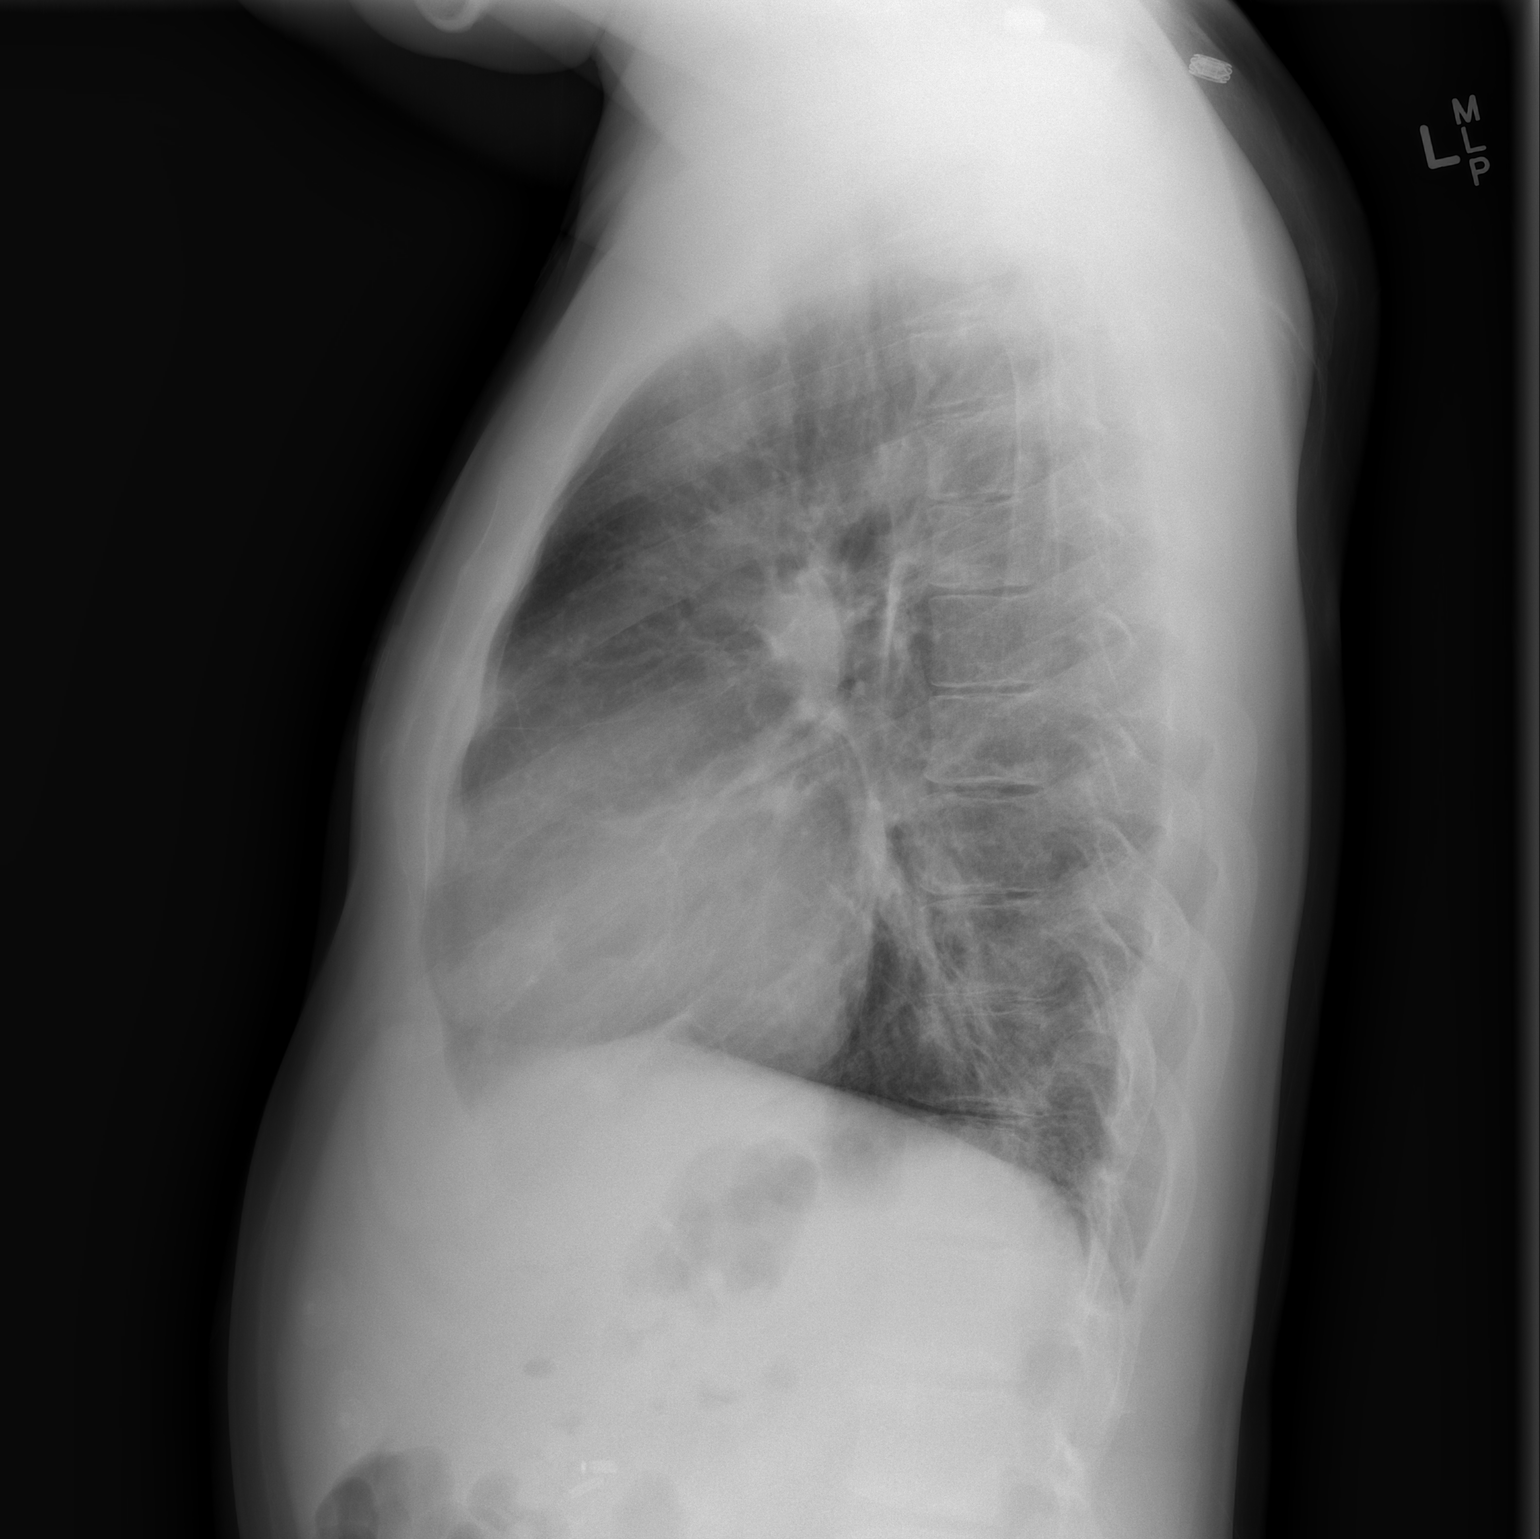

[2 of 2 positions shown; findings below may reference images not displayed]

FINDINGS: Minimal enlargement of cardiac silhouette.
Decreased pulmonary vascular congestion.
Mediastinal contours normal.
Peribronchial thickening without infiltrate or effusion.
Minimal chronic accentuation of right basilar markings, slightly
improved.
No pneumothorax.
No acute osseous findings.
Prior avascular necrosis within left humeral head.
IMPRESSION: No acute abnormalities.

## 2014-04-22 MED ORDER — HYDROMORPHONE HCL 2 MG/ML IJ SOLN
3.0000 mg | Freq: Once | INTRAMUSCULAR | Status: AC
  Administered 2014-04-22: 3 mg via INTRAVENOUS
  Filled 2014-04-22: qty 2

## 2014-04-22 MED ORDER — DIPHENHYDRAMINE HCL 25 MG PO CAPS: 25.0000 mg | ORAL_CAPSULE | Freq: Four times a day (QID) | ORAL | Status: DC | PRN

## 2014-04-22 MED ORDER — HYDROMORPHONE 2 MG/ML HIGH CONCENTRATION IV PCA SOLN
INTRAVENOUS | Status: DC
  Administered 2014-04-22: 13.6 mg via INTRAVENOUS
  Administered 2014-04-22: 11:00:00 via INTRAVENOUS
  Filled 2014-04-22 (×2): qty 25

## 2014-04-22 MED ORDER — ONDANSETRON HCL 4 MG/2ML IJ SOLN: 4.0000 mg | Freq: Four times a day (QID) | INTRAMUSCULAR | Status: DC | PRN

## 2014-04-22 MED ORDER — HEPARIN SOD (PORK) LOCK FLUSH 100 UNIT/ML IV SOLN
500.0000 [IU] | INTRAVENOUS | Status: AC | PRN
  Administered 2014-04-22: 500 [IU]
  Filled 2014-04-22: qty 5

## 2014-04-22 MED ORDER — MORPHINE SULFATE ER 30 MG PO TBCR
30.0000 mg | EXTENDED_RELEASE_TABLET | Freq: Two times a day (BID) | ORAL | Status: DC
  Administered 2014-04-22: 30 mg via ORAL
  Filled 2014-04-22: qty 1

## 2014-04-22 MED ORDER — HYDROMORPHONE HCL 2 MG/ML IJ SOLN
2.5000 mg | Freq: Once | INTRAMUSCULAR | Status: AC
  Administered 2014-04-22: 2.5 mg via INTRAVENOUS
  Filled 2014-04-22: qty 2

## 2014-04-22 MED ORDER — HYDROMORPHONE HCL 4 MG PO TABS
4.0000 mg | ORAL_TABLET | Freq: Once | ORAL | Status: AC
  Administered 2014-04-22: 4 mg via ORAL
  Filled 2014-04-22: qty 1

## 2014-04-22 MED ORDER — HYDROMORPHONE HCL 2 MG/ML IJ SOLN
2.0000 mg | Freq: Once | INTRAMUSCULAR | Status: AC
  Administered 2014-04-22: 2 mg via INTRAVENOUS
  Filled 2014-04-22: qty 1

## 2014-04-22 MED ORDER — NALOXONE HCL 0.4 MG/ML IJ SOLN: 0.4000 mg | INTRAMUSCULAR | Status: DC | PRN

## 2014-04-22 MED ORDER — SODIUM CHLORIDE 0.9 % IJ SOLN
10.0000 mL | INTRAMUSCULAR | Status: AC | PRN
  Administered 2014-04-22: 10 mL

## 2014-04-22 MED ORDER — KETOROLAC TROMETHAMINE 30 MG/ML IJ SOLN
30.0000 mg | Freq: Four times a day (QID) | INTRAMUSCULAR | Status: DC
  Administered 2014-04-22: 30 mg via INTRAVENOUS
  Filled 2014-04-22: qty 1

## 2014-04-22 MED ORDER — DEXTROSE-NACL 5-0.45 % IV SOLN
INTRAVENOUS | Status: DC
  Administered 2014-04-22: 10:00:00 via INTRAVENOUS

## 2014-04-22 MED ORDER — SODIUM CHLORIDE 0.9 % IJ SOLN: 9.0000 mL | INTRAMUSCULAR | Status: DC | PRN

## 2014-04-22 NOTE — Telephone Encounter (Signed)
Patient C/O pain to ribs and back that is 8/10 on pain scale.  Patient denies chest pain or shortness of breath, patient denies N/V/D and fever.  Patient states he has taking dilaudid tablets without improvement.  I advised I would notify the physician and give him a call back.  Patient verbalizes understanding.

## 2014-04-22 NOTE — Progress Notes (Signed)
Patient at nursing station asking to be discharged.  Patient states pain is currently 5/10 on pain scale.  I advised I would notify the physician.  Patient verbalizes understanding.

## 2014-04-22 NOTE — Discharge Summary (Signed)
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 He attempted to manage with her oral medications but was without his MS Contin and thus unsuccessful in his attempts to control pain.His pain is localized to the ribs, back and legs and he rates pain as 8/10. The pain is described as sharp and at times throbbing. The pain is not associated with any other symptoms .He denies any SOB, dizziness, pre-syncope. Fevers, chills N/V/D. He is being admitted to the [facility] for management of early acute sickle cell crisis. I have again explained the difference between MS Contin (Long Acting medication) and Dilaudid which is to be used when pain exceed that which is controlled by the long acting pain medication.   Hospital Course:  Pt was initially treated with weight based rapid re-dosing IVF and Toradol . The pain remained above 7/10 thus he was transitioned to a weight based Dilaudid PCA and given a dose of MS contin. His pain decreased to 5/10which was his goal for discharge. Pt felt that he could manage his pain at home at this level. He is to pick up his prescription for MS contin today from his Primary Care Physician's office.  Discharge Exam:  Filed Vitals:   04/22/14 1411  BP:   Pulse: 90  Temp: 98.6 F (37 C)  Resp: 18   Filed Vitals:   04/22/14 0856 04/22/14 1211 04/22/14 1311 04/22/14 1411  BP:  123/81 125/71   Pulse: 85 96 97 90  Temp: 98.2 F  (36.8 C) 98.7 F (37.1 C) 98.6 F (37 C) 98.6 F (37 C)  TempSrc: Oral Oral Oral Oral  Resp: 18 22 19 18   Height: 6' (1.829 m)     Weight: 163 lb (73.936 kg)     SpO2: 100% 93% 93% 95%    General: Alert, awake, oriented x3, in no acute distress. Pt well appearing. HEENT: [place]/AT PEERL, EOMI, anicteric Neck: Trachea midline,  no masses, no thyromegal,y no JVD, no carotid bruit OROPHARYNX:  Moist, No exudate/ erythema/lesions.  Heart: Regular rate and rhythm. Lungs: Clear to auscultation, no wheezing or rhonchi noted.  Abdomen: Soft, nontender, nondistended, positive bowel sounds, no masses no hepatosplenomegaly noted.  Neuro: No focal neurological deficits noted cranial nerves II through XII grossly intact. Strength normal in bilateral upper and lower extremities. Musculoskeletal: No warm swelling or erythema around joints, no spinal tenderness noted. Psychiatric: Patient alert and oriented x3, good insight and cognition, good recent to remote recall.  Discharge Instructions     Medication List    TAKE these medications        amoxicillin-clavulanate 875-125 MG per tablet  Commonly known as:  AUGMENTIN  Take 1 tablet by mouth daily.     diltiazem 120 MG 24 hr capsule  Commonly known as:  CARDIZEM CD  Take 1 capsule (120 mg total) by mouth daily.     folic acid 1 MG tablet  Commonly known as:  FOLVITE  Take 1 tablet (1 mg total) by mouth every morning.     HYDROmorphone 4 MG tablet  Commonly known as:  DILAUDID  Take 1 tablet (4 mg total) by mouth every 4 (four) hours as needed for severe pain.     hydroxyurea 500 MG capsule  Commonly known as:  HYDREA  Take 3 capsules (1,500 mg total) by mouth daily. May take with food to minimize GI side effects.     lisinopril 10 MG tablet  Commonly known as:  PRINIVIL,ZESTRIL  Take 1 tablet (10 mg total) by mouth daily.     metoprolol tartrate 25 MG tablet  Commonly known as:  LOPRESSOR  Take 1 tablet (25 mg total) by mouth  daily.     morphine 30 MG 12 hr tablet  Commonly known as:  MS CONTIN  Take 1 tablet (30 mg total) by mouth every 12 (twelve) hours.     potassium chloride SA 20 MEQ tablet  Commonly known as:  K-DUR,KLOR-CON  Take 1 tablet (20 mEq total) by mouth every morning.     rivaroxaban 20 MG Tabs tablet  Commonly known as:  XARELTO  Take 1 tablet (20 mg total) by mouth every morning.     Vitamin D 2000 UNITS tablet  Take 1 tablet (2,000 Units total) by mouth daily.     zolpidem 10 MG tablet  Commonly known as:  AMBIEN  Take 1 tablet (10 mg total) by mouth at bedtime as needed for sleep.          The results of significant diagnostics from this hospitalization (including imaging, microbiology, ancillary and laboratory) are listed below for reference.    Significant Diagnostic Studies: Dg Chest 2 View  04/06/2014   CLINICAL DATA:  LEFT-sided chest pain. Rib pain. Symptoms for 1 week. Sickle cell anemia.  EXAM: CHEST  2 VIEW  COMPARISON:  03/31/2014.  FINDINGS: LEFT subclavian power port unchanged. Cardiopericardial silhouette is enlarged but unchanged compared to prior. There is no airspace consolidation to suggest acute chest syndrome. No pleural effusions. Chronic pulmonary parenchymal scarring associated with sickle cell disease. Bony thoracic spine sickle cell changes better seen on prior CT than on today's radiograph. Enlargement of the pulmonary hila compatible with secondary pulmonary hypertension.  IMPRESSION: Chronic sickle cell changes without findings of acute chest syndrome.   Electronically Signed   By: Dereck Ligas M.D.   On: 04/06/2014 08:13   Dg Chest 2 View  03/31/2014   CLINICAL DATA:  Chest pain and shortness of breath. Sickle cell crisis.  EXAM: CHEST  2 VIEW  COMPARISON:  Chest x-ray dated 03/16/2014 and chest CT 03/19/2014  FINDINGS: Chronic cardiomegaly. Pulmonary vascularity is within normal limits. Power port in place. Chronic accentuation of the interstitial  markings. No acute infiltrates.  IMPRESSION: Chronic cardiomegaly.  Stable scarring in the lungs.   Electronically Signed   By: Lorriane Shire M.D.   On: 03/31/2014 12:29    Microbiology: No results found for this or any previous visit (from the past 240 hour(s)).   Labs: Basic Metabolic Panel:  Recent Labs Lab 04/20/14 1014  NA 139  K 4.2  CL 108  CO2 23  GLUCOSE 100*  BUN 18  CREATININE 0.73  CALCIUM 8.9   Liver Function Tests: No results for input(s): AST, ALT, ALKPHOS, BILITOT, PROT, ALBUMIN in the last 168 hours. No results for input(s): LIPASE, AMYLASE in the last 168 hours. No results for input(s): AMMONIA in the last 168 hours.  CBC:  Recent Labs Lab 04/20/14 1014 04/22/14 0937  WBC 13.1* 11.4*  NEUTROABS 8.3* 6.5  HGB 6.7* 7.2*  HCT 19.9* 21.9*  MCV 93.0 96.1  PLT 288 337   Cardiac Enzymes: No results for input(s): CKTOTAL, CKMB, CKMBINDEX, TROPONINI in the last 168 hours. BNP: Invalid input(s): POCBNP CBG: No results for input(s): GLUCAP in the last 168 hours. Ferritin: No results for input(s): FERRITIN in the last 168 hours.  Time coordinating discharge: 45 minutes  Signed:  [name],[name]  04/22/2014, 3:45 PM

## 2014-04-22 NOTE — Progress Notes (Signed)
Patient ID: [name], male   DOB: 31-Aug-1980, 34 y.o.   MRN: [id number] Discharge instructions given to patient, along with follow up information.  Port flushed and de-accessed per protocol.  Patient states pain is currently 4/10 on pain scale.  Patient states brother is coming to transport him home.

## 2014-04-22 NOTE — Discharge Instructions (Signed)
Sickle Cell Anemia, Adult °Sickle cell anemia is a condition in which red blood cells have an abnormal "sickle" shape. This abnormal shape shortens the cells' life span, which results in a lower than normal concentration of red blood cells in the blood. The sickle shape also causes the cells to clump together and block free blood flow through the blood vessels. As a result, the tissues and organs of the body do not receive enough oxygen. Sickle cell anemia causes organ damage and pain and increases the risk of infection. °CAUSES  °Sickle cell anemia is a genetic disorder. Those who receive two copies of the gene have the condition, and those who receive one copy have the trait. °RISK FACTORS °The sickle cell gene is most common in people whose families originated in [place]. Other areas of the globe where sickle cell trait occurs include the [place], [place] and [place], the [place], and the [place].  °SIGNS AND SYMPTOMS °· Pain, especially in the extremities, back, chest, or abdomen (common). The pain may start suddenly or may develop following an illness, especially if there is dehydration. Pain can also occur due to overexertion or exposure to extreme temperature changes. °· Frequent severe bacterial infections, especially certain types of pneumonia and meningitis. °· Pain and swelling in the hands and feet. °· Decreased activity.   °· Loss of appetite.   °· Change in behavior. °· Headaches. °· Seizures. °· Shortness of breath or difficulty breathing. °· Vision changes. °· Skin ulcers. °Those with the trait may not have symptoms or they may have mild symptoms.  °DIAGNOSIS  °Sickle cell anemia is diagnosed with blood tests that demonstrate the genetic trait. It is often diagnosed during the newborn period, due to mandatory testing nationwide. A variety of blood tests, X-rays, CT scans, MRI scans, ultrasounds, and lung function tests may also be done to monitor the condition. °TREATMENT  °Sickle  cell anemia may be treated with: °· Medicines. You may be given pain medicines, antibiotic medicines (to treat and prevent infections) or medicines to increase the production of certain types of hemoglobin. °· Fluids. °· Oxygen. °· Blood transfusions. °HOME CARE INSTRUCTIONS  °· Drink enough fluid to keep your urine clear or pale yellow. Increase your fluid intake in hot weather and during exercise. °· Do not smoke. Smoking lowers oxygen levels in the blood.   °· Only take over-the-counter or prescription medicines for pain, fever, or discomfort as directed by your health care provider. °· Take antibiotics as directed by your health care provider. Make sure you finish them it even if you start to feel better.   °· Take supplements as directed by your health care provider.   °· Consider wearing a medical alert bracelet. This tells anyone caring for you in an emergency of your condition.   °· When traveling, keep your medical information, health care provider's names, and the medicines you take with you at all times.   °· If you develop a fever, do not take medicines to reduce the fever right away. This could cover up a problem that is developing. Notify your health care provider. °· Keep all follow-up appointments with your health care provider. Sickle cell anemia requires regular medical care. °SEEK MEDICAL CARE IF: ° You have a fever. °SEEK IMMEDIATE MEDICAL CARE IF:  °· You feel dizzy or faint.   °· You have new abdominal pain, especially on the left side near the stomach area.   °· You develop a persistent, often uncomfortable and painful penile erection (priapism). If this is not treated immediately it   will lead to impotence.   °· You have numbness your arms or legs or you have a hard time moving them.   °· You have a hard time with speech.   °· You have a fever or persistent symptoms for more than 2-3 days.   °· You have a fever and your symptoms suddenly get worse.   °· You have signs or symptoms of infection.  These include:   °¨ Chills.   °¨ Abnormal tiredness (lethargy).   °¨ Irritability.   °¨ Poor eating.   °¨ Vomiting.   °· You develop pain that is not helped with medicine.   °· You develop shortness of breath. °· You have pain in your chest.   °· You are coughing up pus-like or bloody sputum.   °· You develop a stiff neck. °· Your feet or hands swell or have pain. °· Your abdomen appears bloated. °· You develop joint pain. °MAKE SURE YOU: °· Understand these instructions. °Document Released: 04/27/2005 Document Revised: 06/03/2013 Document Reviewed: 08/29/2012 °[employer]® Patient Information ©2015 [employer]. This information is not intended to replace advice given to you by your health care provider. Make sure you discuss any questions you have with your health care provider. ° °

## 2014-04-22 NOTE — Telephone Encounter (Signed)
Spoke with dr. Zigmund Daniel, advised patient it is ok for him to come to [facility].  Patient verbalizes understanding.

## 2014-04-22 NOTE — H&P (Signed)
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 He attempted to manage with her oral medications but was without his MS Contin and thus unsuccessful in his attempts to control pain.His pain is localized to the ribs, back and legs and he rates pain as 8/10. The pain is described as sharp and at times throbbing. The pain is not associated with any other symptoms .He denies any SOB, dizziness, pre-syncope. Fevers, chills N/V/D. He is being admitted to the [facility] for management of early acute sickle cell crisis. I have again explained the difference between MS Contin (Long Acting medication)  and Dilaudid which is to be used when pain exceed that which is controlled by the long acting pain medication.      Review of Systems:  Constitutional: No weight loss, night sweats, Fevers, chills, fatigue.  HEENT: No headaches, dizziness, seizures, vision changes, difficulty swallowing,Tooth/dental problems,Sore throat, No sneezing, itching, ear ache, nasal congestion, post nasal drip,  Cardio-vascular: No chest pain, Orthopnea, PND, swelling in lower extremities, anasarca, dizziness, palpitations  GI: No heartburn, indigestion, abdominal pain, nausea, vomiting, diarrhea, change in bowel habits, loss of appetite  Resp: No shortness of breath with exertion or at rest. No excess mucus, no productive cough, No non-productive cough, No coughing up of blood.No change in color of mucus.No wheezing.No chest wall deformity  Skin: no rash or lesions.  GU: no dysuria, change in color of urine, no urgency or frequency. No flank pain.  Psych: No change in mood or affect. No depression or anxiety. No memory loss.    Past Medical History  Diagnosis Date  . Sickle cell  anemia   . Blood transfusion   . Acute embolism and thrombosis of right internal jugular vein   . Hypokalemia   . Mood disorder   . History of pulmonary embolus (PE)   . Avascular necrosis   . Leukocytosis     Chronic  . Thrombocytosis     Chronic  . Hypertension   . History of Clostridium difficile infection   . Uses marijuana   . Chronic anticoagulation   . Functional asplenia   . Former smoker   . Second hand tobacco smoke exposure   . Alcohol consumption of one to four drinks per day   . Noncompliance with medication regimen   . Sickle-cell crisis with associated acute chest syndrome 05/13/2013  . Acute chest syndrome 06/18/2013  . Demand ischemia 01/02/2014   Past Surgical History  Procedure Laterality Date  . Right hip replacement      08/2006  . Cholecystectomy      01/2008  . Porta cath placement    . Porta cath removal    . Umbilical hernia repair      01/2008  . Excision of left periauricular cyst      10/2009  . Excision of right ear lobe cyst with primary closur      11/2007  . Portacath placement  01/05/2012    Procedure: INSERTION PORT-A-CATH;  Surgeon: Odis Hollingshead, MD;  Location: [place];  Service: General;  Laterality: N/A;  ultrasound guiced port a cath insertion with fluoroscopy   Social History:  reports that he quit smoking about 3 years ago. He  has never used smokeless tobacco. He reports that he drinks about 1.2 oz of alcohol per week. He reports that he uses illicit drugs (Marijuana) about twice per week.  No Known Allergies  Family History  Problem Relation Age of Onset  . Sickle cell trait Mother   . Depression Mother   . Diabetes Mother   . Sickle cell trait Father   . Sickle cell trait Brother     Prior to Admission medications   Medication Sig Start Date End Date Taking? Authorizing Provider  Cholecalciferol (VITAMIN D) 2000 UNITS tablet Take 1 tablet (2,000 Units total) by mouth daily. 02/13/14  Yes [name], MD  diltiazem  (CARDIZEM CD) 120 MG 24 hr capsule Take 1 capsule (120 mg total) by mouth daily. 03/13/14  Yes Costin Karlyne Greenspan, MD  folic acid (FOLVITE) 1 MG tablet Take 1 tablet (1 mg total) by mouth every morning. 10/08/13  Yes [name], MD  HYDROmorphone (DILAUDID) 4 MG tablet Take 1 tablet (4 mg total) by mouth every 4 (four) hours as needed for severe pain. 04/03/14  Yes [name], MD  hydroxyurea (HYDREA) 500 MG capsule Take 3 capsules (1,500 mg total) by mouth daily. May take with food to minimize GI side effects. 10/30/13  Yes [name], MD  lisinopril (PRINIVIL,ZESTRIL) 10 MG tablet Take 1 tablet (10 mg total) by mouth daily. 01/22/14  Yes [name], MD  metoprolol tartrate (LOPRESSOR) 25 MG tablet Take 1 tablet (25 mg total) by mouth daily. 08/22/13  Yes [name], MD  morphine (MS CONTIN) 30 MG 12 hr tablet Take 1 tablet (30 mg total) by mouth every 12 (twelve) hours. 04/15/14  Yes [name], MD  potassium chloride SA (K-DUR,KLOR-CON) 20 MEQ tablet Take 1 tablet (20 mEq total) by mouth every morning. 09/25/13  Yes [name], MD  rivaroxaban (XARELTO) 20 MG TABS tablet Take 1 tablet (20 mg total) by mouth every morning. 03/13/14  Yes Costin Karlyne Greenspan, MD  zolpidem (AMBIEN) 10 MG tablet Take 1 tablet (10 mg total) by mouth at bedtime as needed for sleep. 04/01/14  Yes [name], MD  amoxicillin-clavulanate (AUGMENTIN) 875-125 MG per tablet Take 1 tablet by mouth daily.  03/24/14   Historical Provider, MD   Physical Exam: Filed Vitals:   04/22/14 0856  Pulse: 85  Temp: 98.2 F (36.8 C)  TempSrc: Oral  Resp: 18  Height: 6' (1.829 m)  Weight: 163 lb (73.936 kg)  SpO2: 100%   General: Alert, awake, oriented x3, in no acute distress.  HEENT: [place]/AT PEERL, EOMI, anicteric Neck: Trachea midline,  no masses, no thyromegal,y no JVD, no carotid bruit OROPHARYNX:  Moist, No exudate/ erythema/lesions.  Heart: Regular rate and rhythm,  without murmurs, rubs, gallops, PMI non-displaced, no heaves or thrills on palpation.  Lungs: Clear to auscultation, no wheezing or rhonchi noted. No increased vocal fremitus resonant to percussion  Abdomen: Soft, nontender, nondistended, positive bowel sounds, no masses no hepatosplenomegaly noted..  Neuro: No focal neurological deficits noted cranial nerves II through XII grossly intact.   Labs on Admission:   Basic Metabolic Panel:  Recent Labs Lab 04/20/14 1014  NA 139  K 4.2  CL 108  CO2 23  GLUCOSE 100*  BUN 18  CREATININE 0.73  CALCIUM 8.9   Liver Function Tests: No results for input(s): AST, ALT, ALKPHOS, BILITOT, PROT, ALBUMIN in the last 168 hours. CBC:  Recent Labs Lab 04/20/14 1014  WBC 13.1*  NEUTROABS 8.3*  HGB 6.7*  HCT 19.9*  MCV 93.0  PLT 288   BNP: Invalid input(s): POCBNP   Radiological Exams on Admission: No results found.    Assessment/Plan: Active Problems: Hb SS with Crisis: Pt will be treated with initially treated with weight based Dilaudid rapid re-dosing IVF and Toradol . If pain is above 7/10 then Pt will be transitioned to a weight based Dilaudid PCA . Patient will be re-evaluated for pain in the context of function and relationship to baseline as care progresses.  I will also give a dose of MS Contin as it will be difficult to control the pain without the long acting medication in his system.   Time spend: 40 minutes Code Status: Full Code Family Communication: N/A Disposition Plan: Likely home at discharge  [name],[name], MD  Pager [phone]  If 7PM-7AM, please contact night-coverage [email or web address] Password [facility] 04/22/2014, 9:13 AM

## 2014-04-24 ENCOUNTER — Non-Acute Institutional Stay (HOSPITAL_BASED_OUTPATIENT_CLINIC_OR_DEPARTMENT_OTHER)
Admission: AD | Admit: 2014-04-24 | Discharge: 2014-04-24 | Disposition: A | Discharge status: Home / Self Care | Payer: Medicare Other | Source: Home / Self Care | Attending: Internal Medicine | Admitting: Internal Medicine

## 2014-04-24 ENCOUNTER — Encounter (HOSPITAL_COMMUNITY): Payer: Self-pay

## 2014-04-24 ENCOUNTER — Ambulatory Visit: Payer: Self-pay | Admitting: Internal Medicine

## 2014-04-24 ENCOUNTER — Emergency Department (HOSPITAL_COMMUNITY): Payer: Medicare Other

## 2014-04-24 ENCOUNTER — Emergency Department (HOSPITAL_COMMUNITY)
Admission: EM | Admit: 2014-04-24 | Discharge: 2014-04-24 | Disposition: A | Discharge status: Home / Self Care | Payer: Medicare Other | Attending: Emergency Medicine | Admitting: Emergency Medicine

## 2014-04-24 DIAGNOSIS — Z8659 Personal history of other mental and behavioral disorders: Secondary | ICD-10-CM | POA: Diagnosis not present

## 2014-04-24 DIAGNOSIS — Z87738 Personal history of other specified (corrected) congenital malformations of digestive system: Secondary | ICD-10-CM | POA: Diagnosis not present

## 2014-04-24 DIAGNOSIS — Z86711 Personal history of pulmonary embolism: Secondary | ICD-10-CM | POA: Diagnosis not present

## 2014-04-24 DIAGNOSIS — Z86718 Personal history of other venous thrombosis and embolism: Secondary | ICD-10-CM | POA: Diagnosis not present

## 2014-04-24 DIAGNOSIS — Z87891 Personal history of nicotine dependence: Secondary | ICD-10-CM | POA: Insufficient documentation

## 2014-04-24 DIAGNOSIS — Z8619 Personal history of other infectious and parasitic diseases: Secondary | ICD-10-CM | POA: Diagnosis not present

## 2014-04-24 DIAGNOSIS — D5700 Hb-SS disease with crisis, unspecified: Secondary | ICD-10-CM | POA: Insufficient documentation

## 2014-04-24 DIAGNOSIS — I10 Essential (primary) hypertension: Secondary | ICD-10-CM | POA: Diagnosis not present

## 2014-04-24 DIAGNOSIS — Z7901 Long term (current) use of anticoagulants: Secondary | ICD-10-CM | POA: Diagnosis not present

## 2014-04-24 DIAGNOSIS — R079 Chest pain, unspecified: Secondary | ICD-10-CM | POA: Diagnosis not present

## 2014-04-24 LAB — CBC WITH DIFFERENTIAL/PLATELET
Basophils Absolute: 0 10*3/uL (ref 0.0–0.1)
Basophils Absolute: 0.1 10*3/uL (ref 0.0–0.1)
Basophils Relative: 0 % (ref 0–1)
Basophils Relative: 1 % (ref 0–1)
EOS PCT: 3 % (ref 0–5)
EOS PCT: 3 % (ref 0–5)
Eosinophils Absolute: 0.4 10*3/uL (ref 0.0–0.7)
Eosinophils Absolute: 0.4 10*3/uL (ref 0.0–0.7)
HEMATOCRIT: 24.4 % — AB (ref 39.0–52.0)
HEMATOCRIT: 20.1 % — AB (ref 39.0–52.0)
Hemoglobin: 8 g/dL — ABNORMAL LOW (ref 13.0–17.0)
Hemoglobin: 6.7 g/dL — CL (ref 13.0–17.0)
LYMPHS ABS: 2.1 10*3/uL (ref 0.7–4.0)
LYMPHS ABS: 2.5 10*3/uL (ref 0.7–4.0)
Lymphocytes Relative: 17 % (ref 12–46)
Lymphocytes Relative: 19 % (ref 12–46)
MCH: 32 pg (ref 26.0–34.0)
MCH: 32.5 pg (ref 26.0–34.0)
MCHC: 32.8 g/dL (ref 30.0–36.0)
MCHC: 33.3 g/dL (ref 30.0–36.0)
MCV: 97.6 fL (ref 78.0–100.0)
MCV: 97.6 fL (ref 78.0–100.0)
MONO ABS: 1.3 10*3/uL — AB (ref 0.1–1.0)
MONOS PCT: 12 % (ref 3–12)
MONOS PCT: 10 % (ref 3–12)
Monocytes Absolute: 1.5 10*3/uL — ABNORMAL HIGH (ref 0.1–1.0)
NRBC: 9 /100{WBCs} — AB
Neutro Abs: 8.3 10*3/uL — ABNORMAL HIGH (ref 1.7–7.7)
Neutro Abs: 9 10*3/uL — ABNORMAL HIGH (ref 1.7–7.7)
Neutrophils Relative %: 68 % (ref 43–77)
Neutrophils Relative %: 67 % (ref 43–77)
PLATELETS: 349 10*3/uL (ref 150–400)
Platelets: 322 10*3/uL (ref 150–400)
RBC: 2.5 MIL/uL — ABNORMAL LOW (ref 4.22–5.81)
RBC: 2.06 MIL/uL — AB (ref 4.22–5.81)
RDW: 25.4 % — ABNORMAL HIGH (ref 11.5–15.5)
RDW: 25.4 % — ABNORMAL HIGH (ref 11.5–15.5)
WBC: 12.3 10*3/uL — AB (ref 4.0–10.5)
WBC: 13.3 10*3/uL — AB (ref 4.0–10.5)

## 2014-04-24 LAB — COMPREHENSIVE METABOLIC PANEL
ALBUMIN: 4.2 g/dL (ref 3.5–5.2)
ALK PHOS: 98 U/L (ref 39–117)
ALT: 54 U/L — ABNORMAL HIGH (ref 0–53)
ALT: 53 U/L (ref 0–53)
ANION GAP: 9 (ref 5–15)
AST: 60 U/L — ABNORMAL HIGH (ref 0–37)
AST: 55 U/L — ABNORMAL HIGH (ref 0–37)
Albumin: 4.3 g/dL (ref 3.5–5.2)
Alkaline Phosphatase: 107 U/L (ref 39–117)
Anion gap: 7 (ref 5–15)
BUN: 11 mg/dL (ref 6–23)
BUN: 9 mg/dL (ref 6–23)
CALCIUM: 8.9 mg/dL (ref 8.4–10.5)
CALCIUM: 8.6 mg/dL (ref 8.4–10.5)
CO2: 24 mmol/L (ref 19–32)
CO2: 24 mmol/L (ref 19–32)
Chloride: 106 mmol/L (ref 96–112)
Chloride: 109 mmol/L (ref 96–112)
Creatinine, Ser: 0.69 mg/dL (ref 0.50–1.35)
Creatinine, Ser: 0.61 mg/dL (ref 0.50–1.35)
GFR calc non Af Amer: >90 mL/min (ref >90)
GFR calc non Af Amer: >90 mL/min (ref >90)
GLUCOSE: 119 mg/dL — AB (ref 70–99)
GLUCOSE: 110 mg/dL — AB (ref 70–99)
Potassium: 4 mmol/L (ref 3.5–5.1)
Potassium: 4 mmol/L (ref 3.5–5.1)
SODIUM: 140 mmol/L (ref 135–145)
Sodium: 139 mmol/L (ref 135–145)
TOTAL PROTEIN: 7.6 g/dL (ref 6.0–8.3)
Total Bilirubin: 3.7 mg/dL — ABNORMAL HIGH (ref 0.3–1.2)
Total Bilirubin: 3.8 mg/dL — ABNORMAL HIGH (ref 0.3–1.2)
Total Protein: 7.5 g/dL (ref 6.0–8.3)

## 2014-04-24 LAB — TYPE AND SCREEN
ABO/RH(D): O POS
Antibody Screen: NEGATIVE
UNIT DIVISION: 0
Unit division: 0

## 2014-04-24 LAB — RETICULOCYTES
RBC.: 2.5 MIL/uL — AB (ref 4.22–5.81)
RBC.: 2.06 MIL/uL — ABNORMAL LOW (ref 4.22–5.81)
RETIC COUNT ABSOLUTE: 377.5 10*3/uL — AB (ref 19.0–186.0)
Retic Count, Absolute: 302.8 10*3/uL — ABNORMAL HIGH (ref 19.0–186.0)
Retic Ct Pct: 15.1 % — ABNORMAL HIGH (ref 0.4–3.1)
Retic Ct Pct: 14.7 % — ABNORMAL HIGH (ref 0.4–3.1)

## 2014-04-24 LAB — LACTATE DEHYDROGENASE: LDH: 360 U/L — ABNORMAL HIGH (ref 94–250)

## 2014-04-24 MED ORDER — NALOXONE HCL 0.4 MG/ML IJ SOLN: 0.4000 mg | INTRAMUSCULAR | Status: DC | PRN

## 2014-04-24 MED ORDER — SODIUM CHLORIDE 0.9 % IV BOLUS (SEPSIS)
1000.0000 mL | Freq: Once | INTRAVENOUS | Status: AC
  Administered 2014-04-24: 1000 mL via INTRAVENOUS

## 2014-04-24 MED ORDER — SENNOSIDES-DOCUSATE SODIUM 8.6-50 MG PO TABS: 1.0000 | ORAL_TABLET | Freq: Two times a day (BID) | ORAL | Status: DC

## 2014-04-24 MED ORDER — DEXTROSE-NACL 5-0.45 % IV SOLN
INTRAVENOUS | Status: DC
  Administered 2014-04-24: 11:00:00 via INTRAVENOUS

## 2014-04-24 MED ORDER — FOLIC ACID 1 MG PO TABS
1.0000 mg | ORAL_TABLET | Freq: Every day | ORAL | Status: DC
Start: 2014-04-24 — End: 2014-04-28
  Administered 2014-04-24: 1 mg via ORAL
  Filled 2014-04-24: qty 1

## 2014-04-24 MED ORDER — HEPARIN SOD (PORK) LOCK FLUSH 100 UNIT/ML IV SOLN
500.0000 [IU] | INTRAVENOUS | Status: AC | PRN
  Administered 2014-04-24: 500 [IU]
  Filled 2014-04-24: qty 5

## 2014-04-24 MED ORDER — POLYETHYLENE GLYCOL 3350 17 G PO PACK: 17.0000 g | PACK | Freq: Every day | ORAL | Status: DC | PRN

## 2014-04-24 MED ORDER — DIPHENHYDRAMINE HCL 12.5 MG/5ML PO ELIX: 12.5000 mg | ORAL_SOLUTION | Freq: Four times a day (QID) | ORAL | Status: DC | PRN

## 2014-04-24 MED ORDER — DIPHENHYDRAMINE HCL 50 MG/ML IJ SOLN
25.0000 mg | Freq: Once | INTRAMUSCULAR | Status: AC
  Administered 2014-04-24: 25 mg via INTRAVENOUS
  Filled 2014-04-24: qty 1

## 2014-04-24 MED ORDER — HYDROMORPHONE 2 MG/ML HIGH CONCENTRATION IV PCA SOLN
INTRAVENOUS | Status: DC
  Administered 2014-04-24: 10.2 mg via INTRAVENOUS
  Administered 2014-04-24: 11:00:00 via INTRAVENOUS
  Filled 2014-04-24 (×2): qty 25

## 2014-04-24 MED ORDER — SODIUM CHLORIDE 0.9 % IV SOLN: 12.5000 mg | Freq: Four times a day (QID) | INTRAVENOUS | Status: DC | PRN

## 2014-04-24 MED ORDER — HYDROMORPHONE HCL 4 MG PO TABS
4.0000 mg | ORAL_TABLET | Freq: Once | ORAL | Status: AC
  Administered 2014-04-24: 4 mg via ORAL

## 2014-04-24 MED ORDER — HYDROMORPHONE HCL 2 MG/ML IJ SOLN
2.0000 mg | Freq: Once | INTRAMUSCULAR | Status: AC
  Administered 2014-04-24: 2 mg via INTRAVENOUS
  Filled 2014-04-24: qty 1

## 2014-04-24 MED ORDER — SODIUM CHLORIDE 0.9 % IJ SOLN
10.0000 mL | INTRAMUSCULAR | Status: AC | PRN
  Administered 2014-04-24: 10 mL

## 2014-04-24 MED ORDER — ONDANSETRON HCL 4 MG/2ML IJ SOLN: 4.0000 mg | Freq: Four times a day (QID) | INTRAMUSCULAR | Status: DC | PRN

## 2014-04-24 MED ORDER — SODIUM CHLORIDE 0.9 % IJ SOLN: 9.0000 mL | INTRAMUSCULAR | Status: DC | PRN

## 2014-04-24 MED ORDER — HYDROMORPHONE HCL 1 MG/ML IJ SOLN
1.0000 mg | Freq: Once | INTRAMUSCULAR | Status: AC
Start: 2014-04-24 — End: 2014-04-24
  Administered 2014-04-24: 1 mg via INTRAVENOUS
  Filled 2014-04-24: qty 1

## 2014-04-24 MED ORDER — KETOROLAC TROMETHAMINE 15 MG/ML IJ SOLN
15.0000 mg | Freq: Four times a day (QID) | INTRAMUSCULAR | Status: DC
  Administered 2014-04-24: 15 mg via INTRAVENOUS
  Filled 2014-04-24 (×20): qty 1

## 2014-04-24 NOTE — Progress Notes (Signed)
Pt arrived to [facility] by wheelchair with EMT escort; pt had been discharged from [facility] ED; called Velna Hatchet, RN  In ED to get information on patient's visit to ED; pt alert, oriented, ambulatory; port already accessed; MD notified; will continue to monitor

## 2014-04-24 NOTE — ED Notes (Signed)
Pt to be d/c to sickle cell medicine by EMT.

## 2014-04-24 NOTE — ED Notes (Signed)
Patient reports back, bilateral legs, and bilateral rib pain that has been intermittent this week due to sickle cell crisis.

## 2014-04-24 NOTE — Discharge Instructions (Signed)
Please proceed to the Sickle cell clinic today.  Sickle Cell Anemia, Adult Sickle cell anemia is a condition in which red blood cells have an abnormal "sickle" shape. This abnormal shape shortens the cells' life span, which results in a lower than normal concentration of red blood cells in the blood. The sickle shape also causes the cells to clump together and block free blood flow through the blood vessels. As a result, the tissues and organs of the body do not receive enough oxygen. Sickle cell anemia causes organ damage and pain and increases the risk of infection. CAUSES  Sickle cell anemia is a genetic disorder. Those who receive two copies of the gene have the condition, and those who receive one copy have the trait. RISK FACTORS The sickle cell gene is most common in people whose families originated in [place]. Other areas of the globe where sickle cell trait occurs include the [place], [place] and [place], and the [place].  SIGNS AND SYMPTOMS  Pain, especially in the extremities, back, chest, or abdomen (common). The pain may start suddenly or may develop following an illness, especially if there is dehydration. Pain can also occur due to overexertion or exposure to extreme temperature changes.  Frequent severe bacterial infections, especially certain types of pneumonia and meningitis.  Pain and swelling in the hands and feet.  Decreased activity.   Loss of appetite.   Change in behavior.  Headaches.  Seizures.  Shortness of breath or difficulty breathing.  Vision changes.  Skin ulcers. Those with the trait may not have symptoms or they may have mild symptoms.  DIAGNOSIS  Sickle cell anemia is diagnosed with blood tests that demonstrate the genetic trait. It is often diagnosed during the newborn period, due to mandatory testing nationwide. A variety of blood tests, X-rays, CT scans, MRI scans, ultrasounds, and lung function tests may also be  done to monitor the condition. TREATMENT  Sickle cell anemia may be treated with:  Medicines. You may be given pain medicines, antibiotic medicines (to treat and prevent infections) or medicines to increase the production of certain types of hemoglobin.  Fluids.  Oxygen.  Blood transfusions. HOME CARE INSTRUCTIONS   Drink enough fluid to keep your urine clear or pale yellow. Increase your fluid intake in hot weather and during exercise.  Do not smoke. Smoking lowers oxygen levels in the blood.   Only take over-the-counter or prescription medicines for pain, fever, or discomfort as directed by your health care provider.  Take antibiotics as directed by your health care provider. Make sure you finish them it even if you start to feel better.   Take supplements as directed by your health care provider.   Consider wearing a medical alert bracelet. This tells anyone caring for you in an emergency of your condition.   When traveling, keep your medical information, health care provider's names, and the medicines you take with you at all times.   If you develop a fever, do not take medicines to reduce the fever right away. This could cover up a problem that is developing. Notify your health care provider.  Keep all follow-up appointments with your health care provider. Sickle cell anemia requires regular medical care. SEEK MEDICAL CARE IF: You have a fever. SEEK IMMEDIATE MEDICAL CARE IF:   You feel dizzy or faint.   You have new abdominal pain, especially on the left side near the stomach area.   You develop a persistent, often uncomfortable and painful penile  erection (priapism). If this is not treated immediately it will lead to impotence.   You have numbness your arms or legs or you have a hard time moving them.   You have a hard time with speech.   You have a fever or persistent symptoms for more than 2-3 days.   You have a fever and your symptoms suddenly get  worse.   You have signs or symptoms of infection. These include:   Chills.   Abnormal tiredness (lethargy).   Irritability.   Poor eating.   Vomiting.   You develop pain that is not helped with medicine.   You develop shortness of breath.  You have pain in your chest.   You are coughing up pus-like or bloody sputum.   You develop a stiff neck.  Your feet or hands swell or have pain.  Your abdomen appears bloated.  You develop joint pain. MAKE SURE YOU:  Understand these instructions. Document Released: 04/27/2005 Document Revised: 06/03/2013 Document Reviewed: 08/29/2012 [facility] Patient Information 2015 [place], [place]. This information is not intended to replace advice given to you by your health care provider. Make sure you discuss any questions you have with your health care provider.

## 2014-04-24 NOTE — H&P (Signed)
[name] is an 34 y.o. male.   Chief Complaint: Pain in back and Legs for 3 days HPI: A 34 year old gentleman was known history of sickle cell disease who was seen in the emergency room last night until this morning with sickle cell painful crisis. Pain is mainly in his lower back and chest wall. Worsened with activities but not relieved by anything at home. He was treated with 2 doses of Dilaudid IV and his pain dropped to 7 out of 10 from 9 out of 10. He wanted to continue his treatment in the day hospital with the goal of getting his pain down to around 5 so he can go home. He denied any shortness of breath no cough no nausea vomiting or diarrhea. 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 He continues to take his Xarelto as prescribed.  Past Medical History  Diagnosis Date  . Sickle cell anemia   . Blood transfusion   . Acute embolism and thrombosis of right internal jugular vein   . Hypokalemia   . Mood disorder   . History of pulmonary embolus (PE)   . Avascular necrosis   . Leukocytosis     Chronic  . Thrombocytosis     Chronic  . Hypertension   . History of Clostridium difficile infection   . Uses marijuana   . Chronic anticoagulation   . Functional asplenia   . Former smoker   . Second hand tobacco smoke exposure   . Alcohol consumption of one to four drinks per day   . Noncompliance with medication regimen   . Sickle-cell crisis with associated acute chest syndrome 05/13/2013  . Acute chest syndrome 06/18/2013  . Demand ischemia 01/02/2014    Past Surgical History  Procedure Laterality Date  . Right hip replacement      08/2006  . Cholecystectomy      01/2008  . Porta cath placement    . Porta cath removal    . Umbilical hernia repair      01/2008  . Excision of left periauricular cyst      10/2009  . Excision of right ear lobe cyst with primary closur      11/2007  . Portacath placement  01/05/2012   Procedure: INSERTION PORT-A-CATH;  Surgeon: Odis Hollingshead, MD;  Location: [place];  Service: General;  Laterality: N/A;  ultrasound guiced port a cath insertion with fluoroscopy    Family History  Problem Relation Age of Onset  . Sickle cell trait Mother   . Depression Mother   . Diabetes Mother   . Sickle cell trait Father   . Sickle cell trait Brother    Social History:  reports that he quit smoking about 3 years ago. He has never used smokeless tobacco. He reports that he drinks about 1.2 oz of alcohol per week. He reports that he uses illicit drugs (Marijuana) about twice per week.  Allergies: No Known Allergies  Medications Prior to Admission  Medication Sig Dispense Refill  . Cholecalciferol (VITAMIN D) 2000 UNITS tablet Take 1 tablet (2,000 Units total) by mouth daily. 90 tablet 3  . diltiazem (CARDIZEM CD) 120 MG 24 hr capsule Take 1 capsule (120 mg total) by mouth daily. 30 capsule 1  . folic acid (FOLVITE) 1 MG tablet Take 1 tablet (1 mg total) by mouth every morning. 30 tablet 11  . HYDROmorphone (DILAUDID) 4 MG tablet Take 1 tablet (4 mg  total) by mouth every 4 (four) hours as needed for severe pain. 90 tablet 0  . hydroxyurea (HYDREA) 500 MG capsule Take 3 capsules (1,500 mg total) by mouth daily. May take with food to minimize GI side effects. 60 capsule 3  . lisinopril (PRINIVIL,ZESTRIL) 10 MG tablet Take 1 tablet (10 mg total) by mouth daily. 30 tablet 1  . metoprolol tartrate (LOPRESSOR) 25 MG tablet Take 1 tablet (25 mg total) by mouth daily. 30 tablet 6  . morphine (MS CONTIN) 30 MG 12 hr tablet Take 1 tablet (30 mg total) by mouth every 12 (twelve) hours. 60 tablet 0  . potassium chloride SA (K-DUR,KLOR-CON) 20 MEQ tablet Take 1 tablet (20 mEq total) by mouth every morning. 30 tablet 3  . rivaroxaban (XARELTO) 20 MG TABS tablet Take 1 tablet (20 mg total) by mouth every morning. 30 tablet 2  . zolpidem (AMBIEN) 10 MG tablet Take 1 tablet (10 mg total) by mouth at  bedtime as needed for sleep. 30 tablet 3    Results for orders placed or performed during the hospital encounter of 04/24/14 (from the past 48 hour(s))  Lactate dehydrogenase     Status: Abnormal   Collection Time: 04/24/14 10:11 AM  Result Value Ref Range   LDH 360 (H) 94 - 250 U/L  Comprehensive metabolic panel     Status: Abnormal   Collection Time: 04/24/14 10:11 AM  Result Value Ref Range   Sodium 140 135 - 145 mmol/L   Potassium 4.0 3.5 - 5.1 mmol/L   Chloride 109 96 - 112 mmol/L   CO2 24 19 - 32 mmol/L   Glucose, Bld 110 (H) 70 - 99 mg/dL   BUN 9 6 - 23 mg/dL   Creatinine, Ser 0.61 0.50 - 1.35 mg/dL   Calcium 8.6 8.4 - 10.5 mg/dL   Total Protein 7.5 6.0 - 8.3 g/dL   Albumin 4.2 3.5 - 5.2 g/dL   AST 55 (H) 0 - 37 U/L   ALT 53 0 - 53 U/L   Alkaline Phosphatase 107 39 - 117 U/L   Total Bilirubin 3.8 (H) 0.3 - 1.2 mg/dL   GFR calc non Af Amer >90 >90 mL/min   GFR calc Af Amer >90 >90 mL/min    Comment: (NOTE) The eGFR has been calculated using the CKD EPI equation. This calculation has not been validated in all clinical situations. eGFR's persistently <90 mL/min signify possible Chronic Kidney Disease.    Anion gap 7 5 - 15  CBC WITH DIFFERENTIAL     Status: Abnormal (Preliminary result)   Collection Time: 04/24/14 10:11 AM  Result Value Ref Range   WBC PENDING 4.0 - 10.5 K/uL   RBC 2.06 (L) 4.22 - 5.81 MIL/uL   Hemoglobin 6.7 (LL) 13.0 - 17.0 g/dL    Comment: REPEATED TO VERIFY CRITICAL RESULT CALLED TO, READ BACK BY AND VERIFIED WITH: I PAOLILLO RN @ 1052 ON 04/24/14 BY C DAVIS    HCT 20.1 (L) 39.0 - 52.0 %   MCV 97.6 78.0 - 100.0 fL   MCH 32.5 26.0 - 34.0 pg   MCHC 33.3 30.0 - 36.0 g/dL   RDW 25.4 (H) 11.5 - 15.5 %   Platelets 349 150 - 400 K/uL   Neutrophils Relative % PENDING 43 - 77 %   Neutro Abs PENDING 1.7 - 7.7 K/uL   Band Neutrophils PENDING 0 - 10 %   Lymphocytes Relative PENDING 12 - 46 %   Lymphs Abs PENDING 0.7 -  4.0 K/uL   Monocytes Relative  PENDING 3 - 12 %   Monocytes Absolute PENDING 0.1 - 1.0 K/uL   Eosinophils Relative PENDING 0 - 5 %   Eosinophils Absolute PENDING 0.0 - 0.7 K/uL   Basophils Relative PENDING 0 - 1 %   Basophils Absolute PENDING 0.0 - 0.1 K/uL   WBC Morphology PENDING    RBC Morphology PENDING    Smear Review PENDING    nRBC PENDING 0 /100 WBC   Metamyelocytes Relative PENDING %   Myelocytes PENDING %   Promyelocytes Absolute PENDING %   Blasts PENDING %  Reticulocytes     Status: Abnormal   Collection Time: 04/24/14 10:11 AM  Result Value Ref Range   Retic Ct Pct 14.7 (H) 0.4 - 3.1 %   RBC. 2.06 (L) 4.22 - 5.81 MIL/uL   Retic Count, Manual 302.8 (H) 19.0 - 186.0 K/uL   Dg Chest 2 View  04/24/2014   CLINICAL DATA:  Sickle cell crisis with chest pain. Gradual onset over 2 days. Worse this morning.  EXAM: CHEST  2 VIEW  COMPARISON:  04/06/2014  FINDINGS: Left chest port remains in place. Cardiomegaly and prominent hila are unchanged. Pulmonary vasculature is normal, no pulmonary edema. Scarring predominantly in the lower lobes, unchanged from prior exam. No consolidation. No pleural effusion or pneumothorax. No acute osseous abnormalities are seen.  IMPRESSION: Stable chronic change, no acute pulmonary process.   Electronically Signed   By: Jeb Levering M.D.   On: 04/24/2014 06:39    Review of Systems  Constitutional: Negative.   Eyes: Negative.   Respiratory: Negative.   Cardiovascular: Negative.   Gastrointestinal: Negative.   Genitourinary: Negative.   Musculoskeletal: Positive for myalgias, back pain, joint pain and neck pain.  Skin: Negative.   Neurological: Negative.   Endo/Heme/Allergies: Negative.   Psychiatric/Behavioral: Negative.     Blood pressure 122/73, pulse 89, temperature 98.6 F (37 C), temperature source Oral, resp. rate 95, SpO2 95 %. Physical Exam  Constitutional: He is oriented to person, place, and time. He appears well-developed and well-nourished.  HENT:  Head:  Normocephalic and atraumatic.  Right Ear: External ear normal.  Left Ear: External ear normal.  Mouth/Throat: Oropharynx is clear and moist.  Eyes: Conjunctivae and EOM are normal. Pupils are equal, round, and reactive to light.  Neck: Normal range of motion. Neck supple.  Cardiovascular: Normal rate, regular rhythm, normal heart sounds and intact distal pulses.   Respiratory: Effort normal and breath sounds normal.  GI: Soft. Bowel sounds are normal.  Musculoskeletal: Normal range of motion. He exhibits tenderness. He exhibits no edema.  Neurological: He is alert and oriented to person, place, and time.  Skin: Skin is warm and dry.  Psychiatric: He has a normal mood and affect.     Assessment/Plan A 34 year old gentleman admitted with sickle cell painful crisis.  #1 sickle cell painful crisis: Patient will be treated with Dilaudid PCA and Toradol. We will hydrate him also with the goal of getting his pain down to at least 5 out of 10.  #2 sickle cell anemia: We will check his hemoglobin and reticulocyte count and if transfusion is indicated we will transfuse.  #3 dehydration: Patient will be aggressively hydrated.  Amala Petion,[name] 04/24/2014, 11:05 AM

## 2014-04-24 NOTE — ED Provider Notes (Signed)
This patient's care was assumed from Charlann Lange, PA-C at shift change. Please see her note for further.   Patient presented with sickle cell pain crisis with pain in his bilateral ribs, back and ankles.  Plan is for continued pain control and fluids all waiting blood work to return. CBC indicated a white count of 12.3 and hemoglobin of 8.0 which is improvement from his previous blood work. CMP is unremarkable for this patient. His reticulocyte count is elevated at 377. Chest x-ray shows  Stable chronic changes no acute pulmonary process.  Patient had 3 rounds of IV Dilaudid with some pain relief. Patient would like to go to the sickle cell clinic at discharge. Prior to discharge the patient is up eating breakfast and reports his pain is 7/10. Discussed with Dr. Jonelle Sidle from  Sickle cell medicine who agrees to plan the patient go to the sickle cell clinic at discharge. Patient will be wheeled to the sickle cell clinic by nurse tech at discharge. Patient is in agreement with the plan. I advised the patient to follow-up with their primary care provider this week. I advised the patient to return to the emergency department with new or worsening symptoms or new concerns. The patient verbalized understanding and agreement with plan.   This patient was discussed with Dr. Maryan Rued who agrees with assessment and plan.   Waynetta Pean, PA-C 04/24/14 1618  Blanchie Dessert, MD 04/25/14 [phone]

## 2014-04-24 NOTE — Discharge Summary (Cosign Needed)
Physician Discharge Summary  RAGAN REALE GLO:[id number] DOB: 22-Apr-1980 DOA: 04/24/2014  PCP: [name],[name], MD  Admit date: 04/24/2014 Discharge date: 04/24/2014  Discharge Diagnoses:  Active Problems:   Sickle cell pain crisis   Sickle cell anemia with crisis   Discharge Condition: Stable  Disposition: Home  Diet:  [employer]  Wt Readings from Last 3 Encounters:  04/24/14 163 lb (73.936 kg)  04/22/14 163 lb (73.936 kg)  04/03/14 163 lb (73.936 kg)    Hospital Course:  Patient was transitioned from the emergency department  to day infusion center for extended observation. Mr. Cerullo was started on a high concentration PCA, using a total of 10.6 mg with 18 demands, 16 deliveries. Current pain intensity is 4/10, which is at goal. Patient was administered dilaudid 4 mg po 40 minutes prior to discharge. Patient alert, oriented, and ambulatory. He states that he can manage pain at home on current medication regimen. Patient to follow up in office as scheduled . Discharge Exam:  Filed Vitals:   04/24/14 1226  BP: 115/68  Pulse: 86  Temp: 98.8 F (37.1 C)  Resp: 21   Filed Vitals:   04/24/14 0940 04/24/14 1031 04/24/14 1132 04/24/14 1226  BP: 122/73  122/66 115/68  Pulse: 89 95 88 86  Temp: 98.6 F (37 C)   98.8 F (37.1 C)  TempSrc: Oral   Oral  Resp: 20 20 20 21   SpO2: 95% 95% 96% 95%     General: Alert, awake, oriented x3, in no acute distress.  HEENT: [place]/AT PEERL, EOMI Neck: Trachea midline,  no masses, no thyromegal,y no JVD, no carotid bruit OROPHARYNX:  Moist, No exudate/ erythema/lesions.  Heart: Irregular rate and rhythm, Murmurs noted, rubs, gallops, PMI non-displaced, no heaves or thrills on palpation.  Lungs: Clear to auscultation, no wheezing or rhonchi noted. No increased vocal fremitus resonant to percussion  Abdomen: Soft, nontender, nondistended, positive bowel sounds, no masses no hepatosplenomegaly noted..  Neuro: No focal  neurological deficits noted cranial nerves II through XII grossly intact. DTRs 2+ bilaterally upper and lower extremities. Strength 5 out of 5 in bilateral upper and lower extremities. Musculoskeletal: No warm swelling or erythema around joints, no spinal tenderness noted. Psychiatric: Patient alert and oriented x3, good insight and cognition, good recent to remote recall. Lymph node survey: No cervical axillary or inguinal lymphadenopathy noted.   Discharge Instructions     Medication List    ASK your doctor about these medications        diltiazem 120 MG 24 hr capsule  Commonly known as:  CARDIZEM CD  Take 1 capsule (120 mg total) by mouth daily.     folic acid 1 MG tablet  Commonly known as:  FOLVITE  Take 1 tablet (1 mg total) by mouth every morning.     HYDROmorphone 4 MG tablet  Commonly known as:  DILAUDID  Take 1 tablet (4 mg total) by mouth every 4 (four) hours as needed for severe pain.     hydroxyurea 500 MG capsule  Commonly known as:  HYDREA  Take 3 capsules (1,500 mg total) by mouth daily. May take with food to minimize GI side effects.     lisinopril 10 MG tablet  Commonly known as:  PRINIVIL,ZESTRIL  Take 1 tablet (10 mg total) by mouth daily.     metoprolol tartrate 25 MG tablet  Commonly known as:  LOPRESSOR  Take 1 tablet (25 mg total) by mouth daily.     morphine 30  MG 12 hr tablet  Commonly known as:  MS CONTIN  Take 1 tablet (30 mg total) by mouth every 12 (twelve) hours.     potassium chloride SA 20 MEQ tablet  Commonly known as:  K-DUR,KLOR-CON  Take 1 tablet (20 mEq total) by mouth every morning.     rivaroxaban 20 MG Tabs tablet  Commonly known as:  XARELTO  Take 1 tablet (20 mg total) by mouth every morning.     Vitamin D 2000 UNITS tablet  Take 1 tablet (2,000 Units total) by mouth daily.     zolpidem 10 MG tablet  Commonly known as:  AMBIEN  Take 1 tablet (10 mg total) by mouth at bedtime as needed for sleep.          The  results of significant diagnostics from this hospitalization (including imaging, microbiology, ancillary and laboratory) are listed below for reference.    Significant Diagnostic Studies: Dg Chest 2 View  04/24/2014   CLINICAL DATA:  Sickle cell crisis with chest pain. Gradual onset over 2 days. Worse this morning.  EXAM: CHEST  2 VIEW  COMPARISON:  04/06/2014  FINDINGS: Left chest port remains in place. Cardiomegaly and prominent hila are unchanged. Pulmonary vasculature is normal, no pulmonary edema. Scarring predominantly in the lower lobes, unchanged from prior exam. No consolidation. No pleural effusion or pneumothorax. No acute osseous abnormalities are seen.  IMPRESSION: Stable chronic change, no acute pulmonary process.   Electronically Signed   By: Jeb Levering M.D.   On: 04/24/2014 06:39   Dg Chest 2 View  04/06/2014   CLINICAL DATA:  LEFT-sided chest pain. Rib pain. Symptoms for 1 week. Sickle cell anemia.  EXAM: CHEST  2 VIEW  COMPARISON:  03/31/2014.  FINDINGS: LEFT subclavian power port unchanged. Cardiopericardial silhouette is enlarged but unchanged compared to prior. There is no airspace consolidation to suggest acute chest syndrome. No pleural effusions. Chronic pulmonary parenchymal scarring associated with sickle cell disease. Bony thoracic spine sickle cell changes better seen on prior CT than on today's radiograph. Enlargement of the pulmonary hila compatible with secondary pulmonary hypertension.  IMPRESSION: Chronic sickle cell changes without findings of acute chest syndrome.   Electronically Signed   By: Dereck Ligas M.D.   On: 04/06/2014 08:13   Dg Chest 2 View  03/31/2014   CLINICAL DATA:  Chest pain and shortness of breath. Sickle cell crisis.  EXAM: CHEST  2 VIEW  COMPARISON:  Chest x-ray dated 03/16/2014 and chest CT 03/19/2014  FINDINGS: Chronic cardiomegaly. Pulmonary vascularity is within normal limits. Power port in place. Chronic accentuation of the interstitial  markings. No acute infiltrates.  IMPRESSION: Chronic cardiomegaly.  Stable scarring in the lungs.   Electronically Signed   By: Lorriane Shire M.D.   On: 03/31/2014 12:29    Microbiology: No results found for this or any previous visit (from the past 240 hour(s)).   Labs: Basic Metabolic Panel:  Recent Labs Lab 04/20/14 1014 04/24/14 0520 04/24/14 1011  NA 139 139 140  K 4.2 4.0 4.0  CL 108 106 109  CO2 23 24 24   GLUCOSE 100* 119* 110*  BUN 18 11 9   CREATININE 0.73 0.69 0.61  CALCIUM 8.9 8.9 8.6   Liver Function Tests:  Recent Labs Lab 04/24/14 0520 04/24/14 1011  AST 60* 55*  ALT 54* 53  ALKPHOS 98 107  BILITOT 3.7* 3.8*  PROT 7.6 7.5  ALBUMIN 4.3 4.2   No results for input(s): LIPASE, AMYLASE in the  last 168 hours. No results for input(s): AMMONIA in the last 168 hours. CBC:  Recent Labs Lab 04/20/14 1014 04/22/14 0937 04/24/14 0520 04/24/14 1011  WBC 13.1* 11.4* 12.3* 13.3*  NEUTROABS 8.3* 6.5 8.3* 9.0*  HGB 6.7* 7.2* 8.0* 6.7*  HCT 19.9* 21.9* 24.4* 20.1*  MCV 93.0 96.1 97.6 97.6  PLT 288 337 322 349   Cardiac Enzymes: No results for input(s): CKTOTAL, CKMB, CKMBINDEX, TROPONINI in the last 168 hours. BNP: Invalid input(s): POCBNP CBG: No results for input(s): GLUCAP in the last 168 hours. Ferritin: No results for input(s): FERRITIN in the last 168 hours.  Time coordinating discharge: Greater than 30 minutes  Signed:  Brysten Reister M  04/24/2014, 1:16 PM

## 2014-04-24 NOTE — ED Provider Notes (Signed)
CSN: [id number]     Arrival date & time 04/24/14  0430 History   First MD Initiated Contact with Patient 04/24/14 [phone]     Chief Complaint  Patient presents with  . Sickle Cell Pain Crisis     (Consider location/radiation/quality/duration/timing/severity/associated sxs/prior Treatment) Patient is a 34 y.o. male presenting with sickle cell pain. The history is provided by the patient. No language interpreter was used.  Sickle Cell Pain Crisis Location:  Chest, back and lower extremity Severity:  Severe Onset quality:  Gradual Duration:  2 days Similar to previous crisis episodes: yes   Timing:  Constant Sickle cell genotype:  SS History of pulmonary emboli: yes   Associated symptoms: no chest pain, no cough, no fever, no nausea and no vomiting     Past Medical History  Diagnosis Date  . Sickle cell anemia   . Blood transfusion   . Acute embolism and thrombosis of right internal jugular vein   . Hypokalemia   . Mood disorder   . History of pulmonary embolus (PE)   . Avascular necrosis   . Leukocytosis     Chronic  . Thrombocytosis     Chronic  . Hypertension   . History of Clostridium difficile infection   . Uses marijuana   . Chronic anticoagulation   . Functional asplenia   . Former smoker   . Second hand tobacco smoke exposure   . Alcohol consumption of one to four drinks per day   . Noncompliance with medication regimen   . Sickle-cell crisis with associated acute chest syndrome 05/13/2013  . Acute chest syndrome 06/18/2013  . Demand ischemia 01/02/2014   Past Surgical History  Procedure Laterality Date  . Right hip replacement      08/2006  . Cholecystectomy      01/2008  . Porta cath placement    . Porta cath removal    . Umbilical hernia repair      01/2008  . Excision of left periauricular cyst      10/2009  . Excision of right ear lobe cyst with primary closur      11/2007  . Portacath placement  01/05/2012    Procedure: INSERTION PORT-A-CATH;   Surgeon: Odis Hollingshead, MD;  Location: [facility];  Service: General;  Laterality: N/A;  ultrasound guiced port a cath insertion with fluoroscopy   Family History  Problem Relation Age of Onset  . Sickle cell trait Mother   . Depression Mother   . Diabetes Mother   . Sickle cell trait Father   . Sickle cell trait Brother    History  Substance Use Topics  . Smoking status: Former Smoker -- 13 years    Quit date: 07/08/2010  . Smokeless tobacco: Never Used  . Alcohol Use: 1.2 oz/week    2 Shots of liquor per week    Review of Systems  Constitutional: Negative for fever and chills.  HENT: Negative.   Respiratory: Negative.  Negative for cough.   Cardiovascular: Negative.  Negative for chest pain.  Gastrointestinal: Negative.  Negative for nausea and vomiting.  Musculoskeletal: Positive for myalgias. Negative for neck pain.  Skin: Negative.  Negative for color change and wound.  Neurological: Negative.       Allergies  Review of patient's allergies indicates no known allergies.  Home Medications   Prior to Admission medications   Medication Sig Start Date End Date Taking? Authorizing Provider  Cholecalciferol (VITAMIN D) 2000 UNITS tablet Take 1 tablet (2,000 Units  total) by mouth daily. 02/13/14  Yes [name], MD  diltiazem (CARDIZEM CD) 120 MG 24 hr capsule Take 1 capsule (120 mg total) by mouth daily. 03/13/14  Yes Costin Karlyne Greenspan, MD  folic acid (FOLVITE) 1 MG tablet Take 1 tablet (1 mg total) by mouth every morning. 10/08/13  Yes [name], MD  HYDROmorphone (DILAUDID) 4 MG tablet Take 1 tablet (4 mg total) by mouth every 4 (four) hours as needed for severe pain. 04/03/14  Yes [name], MD  hydroxyurea (HYDREA) 500 MG capsule Take 3 capsules (1,500 mg total) by mouth daily. May take with food to minimize GI side effects. 10/30/13  Yes [name], MD  lisinopril (PRINIVIL,ZESTRIL) 10 MG tablet Take 1 tablet (10 mg total) by mouth daily.  01/22/14  Yes [name], MD  metoprolol tartrate (LOPRESSOR) 25 MG tablet Take 1 tablet (25 mg total) by mouth daily. 08/22/13  Yes [name], MD  morphine (MS CONTIN) 30 MG 12 hr tablet Take 1 tablet (30 mg total) by mouth every 12 (twelve) hours. 04/15/14  Yes [name], MD  potassium chloride SA (K-DUR,KLOR-CON) 20 MEQ tablet Take 1 tablet (20 mEq total) by mouth every morning. 09/25/13  Yes [name], MD  rivaroxaban (XARELTO) 20 MG TABS tablet Take 1 tablet (20 mg total) by mouth every morning. 03/13/14  Yes Costin Karlyne Greenspan, MD  zolpidem (AMBIEN) 10 MG tablet Take 1 tablet (10 mg total) by mouth at bedtime as needed for sleep. 04/01/14  Yes [name], MD   BP 111/68 mmHg  Pulse 88  Temp(Src) 98.3 F (36.8 C) (Oral)  Resp 18  Ht 6' (1.829 m)  Wt 163 lb (73.936 kg)  BMI 22.10 kg/m2  SpO2 96% Physical Exam  Constitutional: He is oriented to person, place, and time. He appears well-developed and well-nourished.  HENT:  Head: Normocephalic.  Neck: Normal range of motion. Neck supple.  Cardiovascular: Normal rate and regular rhythm.   Pulmonary/Chest: Effort normal and breath sounds normal.  Abdominal: Soft. Bowel sounds are normal. There is no tenderness. There is no rebound and no guarding.  Musculoskeletal: Normal range of motion.  Neurological: He is alert and oriented to person, place, and time.  Skin: Skin is warm and dry. No rash noted.  Psychiatric: He has a normal mood and affect.    ED Course  Procedures (including critical care time) Labs Review Labs Reviewed  CBC WITH DIFFERENTIAL/PLATELET  COMPREHENSIVE METABOLIC PANEL  RETICULOCYTES    Imaging Review No results found.   EKG Interpretation None      MDM   Final diagnoses:  None    1. Sickle Cell Crisis  Patient care is transferred to Will Dansy, PA-C, with labs pending. He will need to be reassessed for pain control and disposition decisions.    Charlann Lange, PA-C 04/24/14 [place], DO 04/24/14 [phone]

## 2014-04-24 NOTE — Progress Notes (Signed)
Pt of Dr.[name] with diagnosis of Sickle Cell Anemia presented with pain 8/10 in rib cage, back.  Pt was transferred from ED.  Fluids and PCA dilaudid infusion was started.  Pt tolerated procedure well. Pain goal of 4/10 was reached and Pt was discharged to home.  [name], [name]

## 2014-04-24 NOTE — Progress Notes (Signed)
Critical lab result report  Lab called with critical Hgb value of 6.7 drawn today at 1011. Result read back.  Value reported to [facility]. Jaishon Krisher, Eustaquio Maize

## 2014-04-28 NOTE — Progress Notes (Signed)
Pt discharged to home; discharge instructions given, explained and signed; no complications noted

## 2014-04-29 ENCOUNTER — Encounter (HOSPITAL_COMMUNITY): Payer: Self-pay | Admitting: Emergency Medicine

## 2014-04-29 ENCOUNTER — Emergency Department (HOSPITAL_COMMUNITY)
Admission: EM | Admit: 2014-04-29 | Discharge: 2014-04-29 | Disposition: A | Discharge status: Home / Self Care | Payer: Medicare Other | Attending: Emergency Medicine | Admitting: Emergency Medicine

## 2014-04-29 ENCOUNTER — Emergency Department (HOSPITAL_COMMUNITY): Payer: Medicare Other

## 2014-04-29 DIAGNOSIS — Z7901 Long term (current) use of anticoagulants: Secondary | ICD-10-CM | POA: Insufficient documentation

## 2014-04-29 DIAGNOSIS — Z8639 Personal history of other endocrine, nutritional and metabolic disease: Secondary | ICD-10-CM | POA: Insufficient documentation

## 2014-04-29 DIAGNOSIS — I10 Essential (primary) hypertension: Secondary | ICD-10-CM | POA: Insufficient documentation

## 2014-04-29 DIAGNOSIS — D5700 Hb-SS disease with crisis, unspecified: Secondary | ICD-10-CM

## 2014-04-29 DIAGNOSIS — Z87891 Personal history of nicotine dependence: Secondary | ICD-10-CM | POA: Insufficient documentation

## 2014-04-29 DIAGNOSIS — Z79899 Other long term (current) drug therapy: Secondary | ICD-10-CM | POA: Diagnosis not present

## 2014-04-29 DIAGNOSIS — Z8659 Personal history of other mental and behavioral disorders: Secondary | ICD-10-CM | POA: Diagnosis not present

## 2014-04-29 DIAGNOSIS — Z87738 Personal history of other specified (corrected) congenital malformations of digestive system: Secondary | ICD-10-CM | POA: Diagnosis not present

## 2014-04-29 DIAGNOSIS — Z452 Encounter for adjustment and management of vascular access device: Secondary | ICD-10-CM | POA: Diagnosis not present

## 2014-04-29 DIAGNOSIS — J984 Other disorders of lung: Secondary | ICD-10-CM | POA: Diagnosis not present

## 2014-04-29 DIAGNOSIS — Z86718 Personal history of other venous thrombosis and embolism: Secondary | ICD-10-CM | POA: Diagnosis not present

## 2014-04-29 DIAGNOSIS — Z8739 Personal history of other diseases of the musculoskeletal system and connective tissue: Secondary | ICD-10-CM | POA: Diagnosis not present

## 2014-04-29 DIAGNOSIS — Z86711 Personal history of pulmonary embolism: Secondary | ICD-10-CM | POA: Insufficient documentation

## 2014-04-29 DIAGNOSIS — R079 Chest pain, unspecified: Secondary | ICD-10-CM

## 2014-04-29 DIAGNOSIS — Z8619 Personal history of other infectious and parasitic diseases: Secondary | ICD-10-CM | POA: Diagnosis not present

## 2014-04-29 LAB — RETICULOCYTES
RBC.: 2.2 MIL/uL — ABNORMAL LOW (ref 4.22–5.81)
Retic Count, Absolute: 360.8 10*3/uL — ABNORMAL HIGH (ref 19.0–186.0)
Retic Ct Pct: 16.4 % — ABNORMAL HIGH (ref 0.4–3.1)

## 2014-04-29 LAB — COMPREHENSIVE METABOLIC PANEL
ALT: 44 U/L (ref 0–53)
AST: 48 U/L — ABNORMAL HIGH (ref 0–37)
Albumin: 4.3 g/dL (ref 3.5–5.2)
Alkaline Phosphatase: 93 U/L (ref 39–117)
Anion gap: 7 (ref 5–15)
BUN: 11 mg/dL (ref 6–23)
CO2: 23 mmol/L (ref 19–32)
Calcium: 8.7 mg/dL (ref 8.4–10.5)
Chloride: 104 mmol/L (ref 96–112)
Creatinine, Ser: 0.52 mg/dL (ref 0.50–1.35)
GFR calc Af Amer: >90 mL/min (ref >90)
GFR calc non Af Amer: >90 mL/min (ref >90)
Glucose, Bld: 115 mg/dL — ABNORMAL HIGH (ref 70–99)
Potassium: 3.7 mmol/L (ref 3.5–5.1)
Sodium: 134 mmol/L — ABNORMAL LOW (ref 135–145)
Total Bilirubin: 5.2 mg/dL — ABNORMAL HIGH (ref 0.3–1.2)
Total Protein: 7.9 g/dL (ref 6.0–8.3)

## 2014-04-29 LAB — CBC WITH DIFFERENTIAL/PLATELET
Basophils Absolute: 0.4 10*3/uL — ABNORMAL HIGH (ref 0.0–0.1)
Basophils Relative: 3 % — ABNORMAL HIGH (ref 0–1)
Eosinophils Absolute: 0.3 10*3/uL (ref 0.0–0.7)
Eosinophils Relative: 2 % (ref 0–5)
HCT: 21.1 % — ABNORMAL LOW (ref 39.0–52.0)
Hemoglobin: 7.1 g/dL — ABNORMAL LOW (ref 13.0–17.0)
Lymphocytes Relative: 4 % — ABNORMAL LOW (ref 12–46)
Lymphs Abs: 0.6 10*3/uL — ABNORMAL LOW (ref 0.7–4.0)
MCH: 32.3 pg (ref 26.0–34.0)
MCHC: 33.6 g/dL (ref 30.0–36.0)
MCV: 95.9 fL (ref 78.0–100.0)
Monocytes Absolute: 1.1 10*3/uL — ABNORMAL HIGH (ref 0.1–1.0)
Monocytes Relative: 8 % (ref 3–12)
Neutro Abs: 11.4 10*3/uL — ABNORMAL HIGH (ref 1.7–7.7)
Neutrophils Relative %: 83 % — ABNORMAL HIGH (ref 43–77)
Platelets: 572 10*3/uL — ABNORMAL HIGH (ref 150–400)
RBC: 2.2 MIL/uL — ABNORMAL LOW (ref 4.22–5.81)
RDW: 26.1 % — ABNORMAL HIGH (ref 11.5–15.5)
WBC: 13.8 10*3/uL — ABNORMAL HIGH (ref 4.0–10.5)
nRBC: 5 /100 WBC — ABNORMAL HIGH

## 2014-04-29 MED ORDER — HYDROMORPHONE HCL 1 MG/ML IJ SOLN
1.0000 mg | Freq: Once | INTRAMUSCULAR | Status: AC
  Administered 2014-04-29: 1 mg via INTRAVENOUS
  Filled 2014-04-29: qty 1

## 2014-04-29 MED ORDER — DIPHENHYDRAMINE HCL 50 MG/ML IJ SOLN
12.5000 mg | Freq: Once | INTRAMUSCULAR | Status: AC
  Administered 2014-04-29: 12.5 mg via INTRAVENOUS
  Filled 2014-04-29: qty 1

## 2014-04-29 MED ORDER — HYDROMORPHONE HCL 2 MG/ML IJ SOLN
3.0000 mg | Freq: Once | INTRAMUSCULAR | Status: AC
  Administered 2014-04-29: 3 mg via INTRAVENOUS
  Filled 2014-04-29: qty 2

## 2014-04-29 MED ORDER — DIAZEPAM 5 MG/ML IJ SOLN
2.5000 mg | Freq: Once | INTRAMUSCULAR | Status: AC
  Administered 2014-04-29: 2.5 mg via INTRAVENOUS
  Filled 2014-04-29: qty 2

## 2014-04-29 MED ORDER — HYDROMORPHONE HCL 2 MG/ML IJ SOLN
2.0000 mg | Freq: Once | INTRAMUSCULAR | Status: AC
  Administered 2014-04-29: 2 mg via INTRAVENOUS
  Filled 2014-04-29: qty 1

## 2014-04-29 MED ORDER — KETOROLAC TROMETHAMINE 15 MG/ML IJ SOLN
15.0000 mg | Freq: Once | INTRAMUSCULAR | Status: AC
  Administered 2014-04-29: 15 mg via INTRAVENOUS
  Filled 2014-04-29: qty 1

## 2014-04-29 MED ORDER — DEXTROSE-NACL 5-0.45 % IV SOLN
INTRAVENOUS | Status: DC
  Administered 2014-04-29: 08:00:00 via INTRAVENOUS

## 2014-04-29 MED ORDER — PROMETHAZINE HCL 25 MG/ML IJ SOLN
6.2500 mg | Freq: Once | INTRAMUSCULAR | Status: AC
  Administered 2014-04-29: 6.25 mg via INTRAVENOUS
  Filled 2014-04-29: qty 1

## 2014-04-29 NOTE — ED Notes (Signed)
Patient would like port accessed for blood draws.

## 2014-04-29 NOTE — ED Provider Notes (Signed)
CSN: [id number]     Arrival date & time 04/29/14  0554 History   First MD Initiated Contact with Patient 04/29/14 [phone]     Chief Complaint  Patient presents with  . Sickle Cell Pain Crisis     (Consider location/radiation/quality/duration/timing/severity/associated sxs/prior Treatment) HPI   34 year old male with pain in his chest and back. Gradual onset of pain shortly before arrival. Pain is in bilateral axilla and extends into his back. Patient has a past history of sickle cell anemia. States the current symptoms feel similar to previous face occlusive crises. Denies any cough. No shortness of breath. No fevers or chills. He reports taking his home pain medication with minimal relief.  Past Medical History  Diagnosis Date  . Sickle cell anemia   . Blood transfusion   . Acute embolism and thrombosis of right internal jugular vein   . Hypokalemia   . Mood disorder   . History of pulmonary embolus (PE)   . Avascular necrosis   . Leukocytosis     Chronic  . Thrombocytosis     Chronic  . Hypertension   . History of Clostridium difficile infection   . Uses marijuana   . Chronic anticoagulation   . Functional asplenia   . Former smoker   . Second hand tobacco smoke exposure   . Alcohol consumption of one to four drinks per day   . Noncompliance with medication regimen   . Sickle-cell crisis with associated acute chest syndrome 05/13/2013  . Acute chest syndrome 06/18/2013  . Demand ischemia 01/02/2014   Past Surgical History  Procedure Laterality Date  . Right hip replacement      08/2006  . Cholecystectomy      01/2008  . Porta cath placement    . Porta cath removal    . Umbilical hernia repair      01/2008  . Excision of left periauricular cyst      10/2009  . Excision of right ear lobe cyst with primary closur      11/2007  . Portacath placement  01/05/2012    Procedure: INSERTION PORT-A-CATH;  Surgeon: Odis Hollingshead, MD;  Location: [facility];  Service: General;   Laterality: N/A;  ultrasound guiced port a cath insertion with fluoroscopy   Family History  Problem Relation Age of Onset  . Sickle cell trait Mother   . Depression Mother   . Diabetes Mother   . Sickle cell trait Father   . Sickle cell trait Brother    History  Substance Use Topics  . Smoking status: Former Smoker -- 13 years    Quit date: 07/08/2010  . Smokeless tobacco: Never Used  . Alcohol Use: 1.2 oz/week    2 Shots of liquor per week    Review of Systems  All systems reviewed and negative, other than as noted in HPI.   Allergies  Review of patient's allergies indicates no known allergies.  Home Medications   Prior to Admission medications   Medication Sig Start Date End Date Taking? Authorizing Provider  Cholecalciferol (VITAMIN D) 2000 UNITS tablet Take 1 tablet (2,000 Units total) by mouth daily. 02/13/14  Yes [name], MD  diltiazem (CARDIZEM CD) 120 MG 24 hr capsule Take 1 capsule (120 mg total) by mouth daily. 03/13/14  Yes Costin Karlyne Greenspan, MD  folic acid (FOLVITE) 1 MG tablet Take 1 tablet (1 mg total) by mouth every morning. 10/08/13  Yes [name], MD  HYDROmorphone (DILAUDID) 4 MG tablet  Take 1 tablet (4 mg total) by mouth every 4 (four) hours as needed for severe pain. 04/03/14  Yes [name], MD  hydroxyurea (HYDREA) 500 MG capsule Take 3 capsules (1,500 mg total) by mouth daily. May take with food to minimize GI side effects. 10/30/13  Yes [name], MD  lisinopril (PRINIVIL,ZESTRIL) 10 MG tablet Take 1 tablet (10 mg total) by mouth daily. 01/22/14  Yes [name], MD  metoprolol tartrate (LOPRESSOR) 25 MG tablet Take 1 tablet (25 mg total) by mouth daily. 08/22/13  Yes [name], MD  morphine (MS CONTIN) 30 MG 12 hr tablet Take 1 tablet (30 mg total) by mouth every 12 (twelve) hours. 04/15/14  Yes [name], MD  potassium chloride SA (K-DUR,KLOR-CON) 20 MEQ tablet Take 1 tablet (20 mEq total) by  mouth every morning. 09/25/13  Yes [name], MD  rivaroxaban (XARELTO) 20 MG TABS tablet Take 1 tablet (20 mg total) by mouth every morning. 03/13/14  Yes Costin Karlyne Greenspan, MD  zolpidem (AMBIEN) 10 MG tablet Take 1 tablet (10 mg total) by mouth at bedtime as needed for sleep. 04/01/14  Yes [name], MD   BP 117/70 mmHg  Pulse 82  Temp(Src) 98.1 F (36.7 C) (Oral)  Resp 19  Ht 6' (1.829 m)  Wt 163 lb (73.936 kg)  BMI 22.10 kg/m2  SpO2 94% Physical Exam  Constitutional: He appears well-developed and well-nourished. No distress.  HENT:  Head: Normocephalic and atraumatic.  Eyes: Conjunctivae are normal. Right eye exhibits no discharge. Left eye exhibits no discharge.  Neck: Neck supple.  Cardiovascular: Normal rate, regular rhythm and normal heart sounds.  Exam reveals no gallop and no friction rub.   No murmur heard. Pulmonary/Chest: Effort normal and breath sounds normal. No respiratory distress.  Abdominal: Soft. He exhibits no distension. There is no tenderness.  Musculoskeletal: He exhibits no edema or tenderness.  Lower extremities symmetric as compared to each other. No calf tenderness. Negative Homan's. No palpable cords.   Neurological: He is alert.  Skin: Skin is warm and dry.  Psychiatric: He has a normal mood and affect. His behavior is normal. Thought content normal.  Nursing note and vitals reviewed.   ED Course  Procedures (including critical care time) Labs Review Labs Reviewed  CBC WITH DIFFERENTIAL/PLATELET - Abnormal; Notable for the following:    WBC 13.8 (*)    RBC 2.20 (*)    Hemoglobin 7.1 (*)    HCT 21.1 (*)    RDW 26.1 (*)    Platelets 572 (*)    Neutrophils Relative % 83 (*)    Lymphocytes Relative 4 (*)    Basophils Relative 3 (*)    nRBC 5 (*)    Neutro Abs 11.4 (*)    Lymphs Abs 0.6 (*)    Monocytes Absolute 1.1 (*)    Basophils Absolute 0.4 (*)    All other components within normal limits  COMPREHENSIVE METABOLIC PANEL -  Abnormal; Notable for the following:    Sodium 134 (*)    Glucose, Bld 115 (*)    AST 48 (*)    Total Bilirubin 5.2 (*)    All other components within normal limits  RETICULOCYTES - Abnormal; Notable for the following:    Retic Ct Pct 16.4 (*)    RBC. 2.20 (*)    Retic Count, Manual 360.8 (*)    All other components within normal limits    Imaging Review Dg Chest  2 View  04/29/2014   CLINICAL DATA:  Sickle cell crisis.  Pain.  EXAM: CHEST  2 VIEW  COMPARISON:  04/24/2014 .  FINDINGS: Power Port catheter noted with lead tip projected over the cavoatrial junction. Mediastinum and hilar structures normal. Cardiomegaly. Stable bilateral pleural parenchymal scarring, no change. No acute infiltrate. No pleural effusion or pneumothorax. Surgical clips right upper quadrant. No acute bony abnormality.  IMPRESSION: 1. Stable changes of pleural parenchymal scarring. No acute infiltrate. 2. Stable cardiomegaly. 3. Power port catheter in stable position.   Electronically Signed   By: Marcello Moores  Register   On: 04/29/2014 08:29     EKG Interpretation None      MDM   Final diagnoses:  Chest pain  Sickle cell anemia with crisis    33yM with chest/back pain. Hx of SS. No respiratory complaints. Afebrile. No hypoxemia. Labs close to baseline. Reports improvement of symptoms to point where he feels comfortable with going home. Low suspicion for emergent complication of sickle cell anemia or serious process. It has been determined that no acute conditions requiring further emergency intervention are present at this time. The patient has been advised of the diagnosis and plan. I reviewed any labs and imaging including any potential incidental findings. We have discussed signs and symptoms that warrant return to the ED and they are listed in the discharge instructions.      Virgel Manifold, MD 05/01/14 [phone]

## 2014-04-29 NOTE — Discharge Instructions (Signed)
Sickle Cell Anemia, Adult °Sickle cell anemia is a condition in which red blood cells have an abnormal "sickle" shape. This abnormal shape shortens the cells' life span, which results in a lower than normal concentration of red blood cells in the blood. The sickle shape also causes the cells to clump together and block free blood flow through the blood vessels. As a result, the tissues and organs of the body do not receive enough oxygen. Sickle cell anemia causes organ damage and pain and increases the risk of infection. °CAUSES  °Sickle cell anemia is a genetic disorder. Those who receive two copies of the gene have the condition, and those who receive one copy have the trait. °RISK FACTORS °The sickle cell gene is most common in people whose families originated in [place]. Other areas of the globe where sickle cell trait occurs include the [place], [place] and [place], the [place], and the [place].  °SIGNS AND SYMPTOMS °· Pain, especially in the extremities, back, chest, or abdomen (common). The pain may start suddenly or may develop following an illness, especially if there is dehydration. Pain can also occur due to overexertion or exposure to extreme temperature changes. °· Frequent severe bacterial infections, especially certain types of pneumonia and meningitis. °· Pain and swelling in the hands and feet. °· Decreased activity.   °· Loss of appetite.   °· Change in behavior. °· Headaches. °· Seizures. °· Shortness of breath or difficulty breathing. °· Vision changes. °· Skin ulcers. °Those with the trait may not have symptoms or they may have mild symptoms.  °DIAGNOSIS  °Sickle cell anemia is diagnosed with blood tests that demonstrate the genetic trait. It is often diagnosed during the newborn period, due to mandatory testing nationwide. A variety of blood tests, X-rays, CT scans, MRI scans, ultrasounds, and lung function tests may also be done to monitor the condition. °TREATMENT  °Sickle  cell anemia may be treated with: °· Medicines. You may be given pain medicines, antibiotic medicines (to treat and prevent infections) or medicines to increase the production of certain types of hemoglobin. °· Fluids. °· Oxygen. °· Blood transfusions. °HOME CARE INSTRUCTIONS  °· Drink enough fluid to keep your urine clear or pale yellow. Increase your fluid intake in hot weather and during exercise. °· Do not smoke. Smoking lowers oxygen levels in the blood.   °· Only take over-the-counter or prescription medicines for pain, fever, or discomfort as directed by your health care provider. °· Take antibiotics as directed by your health care provider. Make sure you finish them it even if you start to feel better.   °· Take supplements as directed by your health care provider.   °· Consider wearing a medical alert bracelet. This tells anyone caring for you in an emergency of your condition.   °· When traveling, keep your medical information, health care provider's names, and the medicines you take with you at all times.   °· If you develop a fever, do not take medicines to reduce the fever right away. This could cover up a problem that is developing. Notify your health care provider. °· Keep all follow-up appointments with your health care provider. Sickle cell anemia requires regular medical care. °SEEK MEDICAL CARE IF: ° You have a fever. °SEEK IMMEDIATE MEDICAL CARE IF:  °· You feel dizzy or faint.   °· You have new abdominal pain, especially on the left side near the stomach area.   °· You develop a persistent, often uncomfortable and painful penile erection (priapism). If this is not treated immediately it   will lead to impotence.   °· You have numbness your arms or legs or you have a hard time moving them.   °· You have a hard time with speech.   °· You have a fever or persistent symptoms for more than 2-3 days.   °· You have a fever and your symptoms suddenly get worse.   °· You have signs or symptoms of infection.  These include:   °¨ Chills.   °¨ Abnormal tiredness (lethargy).   °¨ Irritability.   °¨ Poor eating.   °¨ Vomiting.   °· You develop pain that is not helped with medicine.   °· You develop shortness of breath. °· You have pain in your chest.   °· You are coughing up pus-like or bloody sputum.   °· You develop a stiff neck. °· Your feet or hands swell or have pain. °· Your abdomen appears bloated. °· You develop joint pain. °MAKE SURE YOU: °· Understand these instructions. °Document Released: 04/27/2005 Document Revised: 06/03/2013 Document Reviewed: 08/29/2012 °[employer]® Patient Information ©2015 [employer]. This information is not intended to replace advice given to you by your health care provider. Make sure you discuss any questions you have with your health care provider. ° °

## 2014-04-29 NOTE — ED Notes (Signed)
The nurse has to access his port. Patient does not want me to get blood.

## 2014-04-29 NOTE — ED Notes (Signed)
Patient c/o "sharp pain" in bilateral rib cage area, 9/10 pain.

## 2014-04-30 ENCOUNTER — Telehealth (HOSPITAL_COMMUNITY): Payer: Self-pay | Admitting: Hematology

## 2014-04-30 ENCOUNTER — Encounter (HOSPITAL_COMMUNITY): Payer: Self-pay | Admitting: Hematology

## 2014-04-30 ENCOUNTER — Institutional Professional Consult (permissible substitution): Payer: Self-pay | Admitting: Cardiology

## 2014-04-30 ENCOUNTER — Non-Acute Institutional Stay (HOSPITAL_COMMUNITY)
Admission: AD | Admit: 2014-04-30 | Discharge: 2014-04-30 | Disposition: A | Discharge status: Home / Self Care | Payer: Medicare Other | Attending: Internal Medicine | Admitting: Internal Medicine

## 2014-04-30 DIAGNOSIS — D571 Sickle-cell disease without crisis: Secondary | ICD-10-CM | POA: Insufficient documentation

## 2014-04-30 DIAGNOSIS — D5700 Hb-SS disease with crisis, unspecified: Secondary | ICD-10-CM | POA: Diagnosis not present

## 2014-04-30 LAB — LACTATE DEHYDROGENASE: LDH: 394 U/L — ABNORMAL HIGH (ref 94–250)

## 2014-04-30 MED ORDER — SODIUM CHLORIDE 0.9 % IJ SOLN
10.0000 mL | INTRAMUSCULAR | Status: AC | PRN
  Administered 2014-04-30: 10 mL

## 2014-04-30 MED ORDER — KETOROLAC TROMETHAMINE 30 MG/ML IJ SOLN
30.0000 mg | Freq: Four times a day (QID) | INTRAMUSCULAR | Status: DC
  Administered 2014-04-30: 30 mg via INTRAVENOUS
  Filled 2014-04-30: qty 1

## 2014-04-30 MED ORDER — SODIUM CHLORIDE 0.9 % IV SOLN
25.0000 mg | INTRAVENOUS | Status: DC | PRN
  Filled 2014-04-30: qty 0.5

## 2014-04-30 MED ORDER — HYDROMORPHONE HCL 2 MG/ML IJ SOLN
3.0000 mg | Freq: Once | INTRAMUSCULAR | Status: AC
  Administered 2014-04-30: 3 mg via INTRAVENOUS
  Filled 2014-04-30: qty 2

## 2014-04-30 MED ORDER — HYDROMORPHONE HCL 2 MG/ML IJ SOLN
2.5000 mg | Freq: Once | INTRAMUSCULAR | Status: AC
  Administered 2014-04-30: 2.5 mg via INTRAVENOUS
  Filled 2014-04-30: qty 2

## 2014-04-30 MED ORDER — HEPARIN SOD (PORK) LOCK FLUSH 100 UNIT/ML IV SOLN
500.0000 [IU] | INTRAVENOUS | Status: AC | PRN
  Administered 2014-04-30: 500 [IU]
  Filled 2014-04-30: qty 5

## 2014-04-30 MED ORDER — DEXTROSE-NACL 5-0.45 % IV SOLN
INTRAVENOUS | Status: DC
  Administered 2014-04-30: 09:00:00 via INTRAVENOUS

## 2014-04-30 MED ORDER — HYDROMORPHONE HCL 2 MG/ML IJ SOLN
2.0000 mg | Freq: Once | INTRAMUSCULAR | Status: AC
  Administered 2014-04-30: 2 mg via INTRAVENOUS
  Filled 2014-04-30: qty 1

## 2014-04-30 MED ORDER — HYDROMORPHONE HCL 4 MG PO TABS
4.0000 mg | ORAL_TABLET | Freq: Once | ORAL | Status: AC
  Administered 2014-04-30: 4 mg via ORAL
  Filled 2014-04-30: qty 1

## 2014-04-30 MED ORDER — ONDANSETRON HCL 4 MG/2ML IJ SOLN: 4.0000 mg | Freq: Four times a day (QID) | INTRAMUSCULAR | Status: DC | PRN

## 2014-04-30 MED ORDER — DIPHENHYDRAMINE HCL 25 MG PO CAPS: 25.0000 mg | ORAL_CAPSULE | Freq: Four times a day (QID) | ORAL | Status: DC | PRN

## 2014-04-30 MED ORDER — HYDROMORPHONE 2 MG/ML HIGH CONCENTRATION IV PCA SOLN
INTRAVENOUS | Status: DC
  Administered 2014-04-30: 11.2 mg via INTRAVENOUS
  Administered 2014-04-30: 11:00:00 via INTRAVENOUS
  Filled 2014-04-30 (×2): qty 25

## 2014-04-30 MED ORDER — NALOXONE HCL 0.4 MG/ML IJ SOLN: 0.4000 mg | INTRAMUSCULAR | Status: DC | PRN

## 2014-04-30 MED ORDER — SODIUM CHLORIDE 0.9 % IJ SOLN: 9.0000 mL | INTRAMUSCULAR | Status: DC | PRN

## 2014-04-30 NOTE — Discharge Instructions (Signed)
Sickle Cell Anemia, Adult °Sickle cell anemia is a condition in which red blood cells have an abnormal "sickle" shape. This abnormal shape shortens the cells' life span, which results in a lower than normal concentration of red blood cells in the blood. The sickle shape also causes the cells to clump together and block free blood flow through the blood vessels. As a result, the tissues and organs of the body do not receive enough oxygen. Sickle cell anemia causes organ damage and pain and increases the risk of infection. °CAUSES  °Sickle cell anemia is a genetic disorder. Those who receive two copies of the gene have the condition, and those who receive one copy have the trait. °RISK FACTORS °The sickle cell gene is most common in people whose families originated in [place]. Other areas of the globe where sickle cell trait occurs include the [place], [place] and [place], the [place], and the [place].  °SIGNS AND SYMPTOMS °· Pain, especially in the extremities, back, chest, or abdomen (common). The pain may start suddenly or may develop following an illness, especially if there is dehydration. Pain can also occur due to overexertion or exposure to extreme temperature changes. °· Frequent severe bacterial infections, especially certain types of pneumonia and meningitis. °· Pain and swelling in the hands and feet. °· Decreased activity.   °· Loss of appetite.   °· Change in behavior. °· Headaches. °· Seizures. °· Shortness of breath or difficulty breathing. °· Vision changes. °· Skin ulcers. °Those with the trait may not have symptoms or they may have mild symptoms.  °DIAGNOSIS  °Sickle cell anemia is diagnosed with blood tests that demonstrate the genetic trait. It is often diagnosed during the newborn period, due to mandatory testing nationwide. A variety of blood tests, X-rays, CT scans, MRI scans, ultrasounds, and lung function tests may also be done to monitor the condition. °TREATMENT  °Sickle  cell anemia may be treated with: °· Medicines. You may be given pain medicines, antibiotic medicines (to treat and prevent infections) or medicines to increase the production of certain types of hemoglobin. °· Fluids. °· Oxygen. °· Blood transfusions. °HOME CARE INSTRUCTIONS  °· Drink enough fluid to keep your urine clear or pale yellow. Increase your fluid intake in hot weather and during exercise. °· Do not smoke. Smoking lowers oxygen levels in the blood.   °· Only take over-the-counter or prescription medicines for pain, fever, or discomfort as directed by your health care provider. °· Take antibiotics as directed by your health care provider. Make sure you finish them it even if you start to feel better.   °· Take supplements as directed by your health care provider.   °· Consider wearing a medical alert bracelet. This tells anyone caring for you in an emergency of your condition.   °· When traveling, keep your medical information, health care provider's names, and the medicines you take with you at all times.   °· If you develop a fever, do not take medicines to reduce the fever right away. This could cover up a problem that is developing. Notify your health care provider. °· Keep all follow-up appointments with your health care provider. Sickle cell anemia requires regular medical care. °SEEK MEDICAL CARE IF: ° You have a fever. °SEEK IMMEDIATE MEDICAL CARE IF:  °· You feel dizzy or faint.   °· You have new abdominal pain, especially on the left side near the stomach area.   °· You develop a persistent, often uncomfortable and painful penile erection (priapism). If this is not treated immediately it   will lead to impotence.   °· You have numbness your arms or legs or you have a hard time moving them.   °· You have a hard time with speech.   °· You have a fever or persistent symptoms for more than 2-3 days.   °· You have a fever and your symptoms suddenly get worse.   °· You have signs or symptoms of infection.  These include:   °¨ Chills.   °¨ Abnormal tiredness (lethargy).   °¨ Irritability.   °¨ Poor eating.   °¨ Vomiting.   °· You develop pain that is not helped with medicine.   °· You develop shortness of breath. °· You have pain in your chest.   °· You are coughing up pus-like or bloody sputum.   °· You develop a stiff neck. °· Your feet or hands swell or have pain. °· Your abdomen appears bloated. °· You develop joint pain. °MAKE SURE YOU: °· Understand these instructions. °Document Released: 04/27/2005 Document Revised: 06/03/2013 Document Reviewed: 08/29/2012 °[employer]® Patient Information ©2015 [employer]. This information is not intended to replace advice given to you by your health care provider. Make sure you discuss any questions you have with your health care provider. ° °

## 2014-04-30 NOTE — H&P (Signed)
[facility] History and Physical  KAL CHAIT YIR:[id number] DOB: [birth year] DOA: 04/30/2014   PCP: Tawyna Pellot A., MD   Chief Complaint: Pain in  Ribs back and legs x 3 days.  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 He attempted to manage with her oral medications but was without his MS Contin and thus unsuccessful in his attempts to control pain.His pain is localized to the ribs, back and legs and he rates pain as 8/10. The pain is described as sharp and at times throbbing. The pain is not associated with any other symptoms .He denies any SOB, dizziness, pre-syncope. Fevers, chills N/V/D. He is being admitted to the [facility] for management of early acute sickle cell crisis.          Review of Systems:  Constitutional: No weight loss, night sweats, Fevers, chills, fatigue.  HEENT: No headaches, dizziness, seizures, vision changes, difficulty swallowing,Tooth/dental problems,Sore throat, No sneezing, itching, ear ache, nasal congestion, post nasal drip,  Cardio-vascular: No chest pain, Orthopnea, PND, swelling in lower extremities, anasarca, dizziness, palpitations  GI: No heartburn, indigestion, abdominal pain, nausea, vomiting, diarrhea, change in bowel habits, loss of appetite  Resp: No shortness of breath with exertion or at rest. No excess mucus, no productive cough, No non-productive cough, No coughing up of blood.No change in color of mucus.No wheezing.No chest wall deformity  Skin: no rash or lesions.  GU: no dysuria, change in color of urine, no urgency or frequency. No flank pain.  Musculoskeletal: No joint pain or swelling. No decreased range of motion. No back pain.  Psych: No change in mood or affect. No depression or anxiety. No memory loss.    Past Medical History  Diagnosis Date  . Sickle cell anemia   . Blood transfusion   . Acute embolism and  thrombosis of right internal jugular vein   . Hypokalemia   . Mood disorder   . History of pulmonary embolus (PE)   . Avascular necrosis   . Leukocytosis     Chronic  . Thrombocytosis     Chronic  . Hypertension   . History of Clostridium difficile infection   . Uses marijuana   . Chronic anticoagulation   . Functional asplenia   . Former smoker   . Second hand tobacco smoke exposure   . Alcohol consumption of one to four drinks per day   . Noncompliance with medication regimen   . Sickle-cell crisis with associated acute chest syndrome 05/13/2013  . Acute chest syndrome 06/18/2013  . Demand ischemia 01/02/2014   Past Surgical History  Procedure Laterality Date  . Right hip replacement      08/2006  . Cholecystectomy      01/2008  . Porta cath placement    . Porta cath removal    . Umbilical hernia repair      01/2008  . Excision of left periauricular cyst      10/2009  . Excision of right ear lobe cyst with primary closur      11/2007  . Portacath placement  01/05/2012    Procedure: INSERTION PORT-A-CATH;  Surgeon: Odis Hollingshead, MD;  Location: [facility];  Service: General;  Laterality: N/A;  ultrasound guiced port a cath insertion with fluoroscopy   Social History:  reports that he quit smoking about 3 years ago. He has never used smokeless tobacco. He reports that  he uses illicit drugs (Marijuana) about twice per week. He reports that he does not drink alcohol.  No Known Allergies  Family History  Problem Relation Age of Onset  . Sickle cell trait Mother   . Depression Mother   . Diabetes Mother   . Sickle cell trait Father   . Sickle cell trait Brother     Prior to Admission medications   Medication Sig Start Date End Date Taking? Authorizing Provider  Cholecalciferol (VITAMIN D) 2000 UNITS tablet Take 1 tablet (2,000 Units total) by mouth daily. 02/13/14  Yes [name], MD  diltiazem (CARDIZEM CD) 120 MG 24 hr capsule Take 1 capsule (120 mg total) by  mouth daily. 03/13/14  Yes Costin Karlyne Greenspan, MD  folic acid (FOLVITE) 1 MG tablet Take 1 tablet (1 mg total) by mouth every morning. 10/08/13  Yes [name], MD  HYDROmorphone (DILAUDID) 4 MG tablet Take 1 tablet (4 mg total) by mouth every 4 (four) hours as needed for severe pain. 04/03/14  Yes [name], MD  hydroxyurea (HYDREA) 500 MG capsule Take 3 capsules (1,500 mg total) by mouth daily. May take with food to minimize GI side effects. 10/30/13  Yes [name], MD  lisinopril (PRINIVIL,ZESTRIL) 10 MG tablet Take 1 tablet (10 mg total) by mouth daily. 01/22/14  Yes [name], MD  metoprolol tartrate (LOPRESSOR) 25 MG tablet Take 1 tablet (25 mg total) by mouth daily. 08/22/13  Yes [name], MD  morphine (MS CONTIN) 30 MG 12 hr tablet Take 1 tablet (30 mg total) by mouth every 12 (twelve) hours. 04/15/14  Yes [name], MD  potassium chloride SA (K-DUR,KLOR-CON) 20 MEQ tablet Take 1 tablet (20 mEq total) by mouth every morning. 09/25/13  Yes [name], MD  rivaroxaban (XARELTO) 20 MG TABS tablet Take 1 tablet (20 mg total) by mouth every morning. 03/13/14  Yes Costin Karlyne Greenspan, MD  zolpidem (AMBIEN) 10 MG tablet Take 1 tablet (10 mg total) by mouth at bedtime as needed for sleep. 04/01/14  Yes [name], MD   Physical Exam: Filed Vitals:   04/30/14 0843  BP: 105/65  Pulse: 82  Temp: 98.4 F (36.9 C)  TempSrc: Oral  Resp: 16  Weight: 164 lb (74.39 kg)  SpO2: 99%   General: Alert, awake, oriented x3, in no acute distress.  HEENT: [place]/AT PEERL, EOMI Neck: Trachea midline,  no masses, no thyromegal,y no JVD, no carotid bruit OROPHARYNX:  Moist, No exudate/ erythema/lesions.  Heart: Regular rate and rhythm, without murmurs, rubs, gallops, PMI non-displaced, no heaves or thrills on palpation.  Lungs: Clear to auscultation, no wheezing or rhonchi noted. No increased vocal fremitus resonant to percussion  Abdomen: Soft,  nontender, nondistended, positive bowel sounds, no masses no hepatosplenomegaly noted..  Neuro: No focal neurological deficits noted cranial nerves II through XII grossly intact.   Labs on Admission:   Basic Metabolic Panel:  Recent Labs Lab 04/24/14 0520 04/24/14 1011 04/29/14 0750  NA 139 140 134*  K 4.0 4.0 3.7  CL 106 109 104  CO2 24 24 23   GLUCOSE 119* 110* 115*  BUN 11 9 11   CREATININE 0.69 0.61 0.52  CALCIUM 8.9 8.6 8.7   Liver Function Tests:  Recent Labs Lab 04/24/14 0520 04/24/14 1011 04/29/14 0750  AST 60* 55* 48*  ALT 54* 53 44  ALKPHOS 98 107 93  BILITOT 3.7* 3.8* 5.2*  PROT 7.6 7.5 7.9  ALBUMIN 4.3  4.2 4.3   CBC:  Recent Labs Lab 04/24/14 0520 04/24/14 1011 04/29/14 0750  WBC 12.3* 13.3* 13.8*  NEUTROABS 8.3* 9.0* 11.4*  HGB 8.0* 6.7* 7.1*  HCT 24.4* 20.1* 21.1*  MCV 97.6 97.6 95.9  PLT 322 349 572*   BNP: Invalid input(s): POCBNP   Radiological Exams on Admission: Dg Chest 2 View  04/29/2014   CLINICAL DATA:  Sickle cell crisis.  Pain.  EXAM: CHEST  2 VIEW  COMPARISON:  04/24/2014 .  FINDINGS: Power Port catheter noted with lead tip projected over the cavoatrial junction. Mediastinum and hilar structures normal. Cardiomegaly. Stable bilateral pleural parenchymal scarring, no change. No acute infiltrate. No pleural effusion or pneumothorax. Surgical clips right upper quadrant. No acute bony abnormality.  IMPRESSION: 1. Stable changes of pleural parenchymal scarring. No acute infiltrate. 2. Stable cardiomegaly. 3. Power port catheter in stable position.   Electronically Signed   By: Marcello Moores  Register   On: 04/29/2014 08:29    Assessment/Plan: Active Problems: Hb SS with Crisis: Pt will be treated with initially treated with weight based rapid re-dosing IVF and Toradol . If pain is above 7/10 then Pt will be transitioned to a weight based Dilaudid PCA. Patient will be re-evaluated for pain in the context of function and relationship to baseline  as care progresses.   Time spend: 30 minutes Code Status: Full Code Disposition Plan:  Likely home at discharge.  Joane Postel A., MD  Pager [phone]  If 7PM-7AM, please contact night-coverage [email or web address] Password [facility] 04/30/2014, 8:48 AM

## 2014-04-30 NOTE — Discharge Summary (Signed)
Physician Discharge Summary  [name] PXT:[id number] DOB: 12-19-1980 DOA: 04/30/2014  PCP: Gerald A., MD  Admit date: 04/30/2014 Discharge date: 04/30/2014  Discharge Diagnoses:  Active Problems:   Hb-SS disease with crisis   Discharge Condition:  Stable  Disposition: Home Follow-up Information    Follow up with Gerald Dew, FNP. Schedule an appointment as soon as possible for a visit in 1 day.   Specialty:  Family Medicine   Why:  To address abdominal pain   Contact information:   [address] [address] [phone]       Diet: Regular diet Wt Readings from Last 3 Encounters:  04/30/14 164 lb (74.39 kg)  04/29/14 163 lb (73.936 kg)  04/24/14 163 lb (73.936 kg)    History of present illness:  Pt with Hb SS well known to me who presents with pain typical of crisis which started 3 days ago when there was a change in weather (decrease temperature and snow) He attempted to manage with his oral medications and was successful for the last 2 days. However pain resumed last night with greater intensity and his oral medications were ineffective in controlling the pain. He rates his pain as 9/10 and not associated with any other symptoms. The pain is localized to b/l ribs.He denies any SOB, dizziness, pre-syncope. Fevers, cough chills N/V/D. He is admitted to the [facility] for management of early acute sickle cell crisis.  Hospital Course:  Pt was initially treated with weight based rapid re-dosing IVF and Toradol . Pain remained above 7/10, and he was transitioned to a weight based Dilaudid PCA . He also received IVF at 100 ml/hr. His laboratory studies showed a Hb of 7.4 which is about baseline for [name] and and LDH of 542. The LDH is higher than normal and I discussed this with patient. However he is hemodynamically stable and his oxygen saturations are at baseline. He is able to ambulate without difficulty and without any further  hypoxemia. I have typed [name] for PRBC's and he will have a recheck of his Hb and LDH tomorrow. I will re-evaluate for need for transfusion.  Discharge Exam:  Filed Vitals:   04/30/14 1246  BP: 115/70  Pulse: 81  Temp: 98.6 F (37 C)  Resp: 20   Filed Vitals:   04/30/14 0843 04/30/14 1049 04/30/14 1150 04/30/14 1246  BP: 105/65  108/80 115/70  Pulse: 82  81 81  Temp: 98.4 F (36.9 C)  98.8 F (37.1 C) 98.6 F (37 C)  TempSrc: Oral  Oral Oral  Resp: 16 18 19 20   Weight: 164 lb (74.39 kg)     SpO2: 99% 94% 95% 98%   General: Alert, awake, oriented x3, in no acute distress.  HEENT: [name]/AT PEERL, EOMI, mild icterus at baseline Neck: Trachea midline,  no masses, no thyromegal,y no JVD, no carotid bruit OROPHARYNX:  Moist, No exudate/ erythema/lesions.  Heart: Regular rate and rhythm, without murmurs, rubs, gallops, PMI non-displaced, no heaves or thrills on palpation.  Lungs: Clear to auscultation, no wheezing or rhonchi noted. No increased vocal fremitus resonant to percussion  Abdomen: Soft, nontender, nondistended, positive bowel sounds, no masses no hepatosplenomegaly noted..  Neuro: No focal neurological deficits noted cranial nerves II through XII grossly intact.  Strength normal in bilateral upper and lower extremities. Musculoskeletal: No warm swelling or erythema around joints, no spinal tenderness noted. Psychiatric: Patient alert and oriented x3, good insight and cognition, good recent to remote  recall. Lymph node survey: No cervical axillary or inguinal lymphadenopathy noted.   Discharge Instructions      Discharge Instructions    Activity as tolerated - No restrictions    Complete by:  As directed      Diet - low sodium heart healthy    Complete by:  As directed             Medication List    TAKE these medications        diltiazem 120 MG 24 hr capsule  Commonly known as:  CARDIZEM CD  Take 1 capsule (120 mg total) by mouth daily.     folic acid  1 MG tablet  Commonly known as:  FOLVITE  Take 1 tablet (1 mg total) by mouth every morning.     HYDROmorphone 4 MG tablet  Commonly known as:  DILAUDID  Take 1 tablet (4 mg total) by mouth every 4 (four) hours as needed for severe pain.     hydroxyurea 500 MG capsule  Commonly known as:  HYDREA  Take 3 capsules (1,500 mg total) by mouth daily. May take with food to minimize GI side effects.     lisinopril 10 MG tablet  Commonly known as:  PRINIVIL,ZESTRIL  Take 1 tablet (10 mg total) by mouth daily.     metoprolol tartrate 25 MG tablet  Commonly known as:  LOPRESSOR  Take 1 tablet (25 mg total) by mouth daily.     morphine 30 MG 12 hr tablet  Commonly known as:  MS CONTIN  Take 1 tablet (30 mg total) by mouth every 12 (twelve) hours.     potassium chloride SA 20 MEQ tablet  Commonly known as:  K-DUR,KLOR-CON  Take 1 tablet (20 mEq total) by mouth every morning.     rivaroxaban 20 MG Tabs tablet  Commonly known as:  XARELTO  Take 1 tablet (20 mg total) by mouth every morning.     Vitamin D 2000 UNITS tablet  Take 1 tablet (2,000 Units total) by mouth daily.     zolpidem 10 MG tablet  Commonly known as:  AMBIEN  Take 1 tablet (10 mg total) by mouth at bedtime as needed for sleep.         Significant Diagnostic Studies: Dg Chest 2 View  04/29/2014   CLINICAL DATA:  Sickle cell crisis.  Pain.  EXAM: CHEST  2 VIEW  COMPARISON:  04/24/2014 .  FINDINGS: Power Port catheter noted with lead tip projected over the cavoatrial junction. Mediastinum and hilar structures normal. Cardiomegaly. Stable bilateral pleural parenchymal scarring, no change. No acute infiltrate. No pleural effusion or pneumothorax. Surgical clips right upper quadrant. No acute bony abnormality.  IMPRESSION: 1. Stable changes of pleural parenchymal scarring. No acute infiltrate. 2. Stable cardiomegaly. 3. Power port catheter in stable position.   Electronically Signed   By: Marcello Moores  Register   On: 04/29/2014  08:29   Dg Chest 2 View  04/24/2014   CLINICAL DATA:  Sickle cell crisis with chest pain. Gradual onset over 2 days. Worse this morning.  EXAM: CHEST  2 VIEW  COMPARISON:  04/06/2014  FINDINGS: Left chest port remains in place. Cardiomegaly and prominent hila are unchanged. Pulmonary vasculature is normal, no pulmonary edema. Scarring predominantly in the lower lobes, unchanged from prior exam. No consolidation. No pleural effusion or pneumothorax. No acute osseous abnormalities are seen.  IMPRESSION: Stable chronic change, no acute pulmonary process.   Electronically Signed   By: Jeb Levering  M.D.   On: 04/24/2014 06:39   Dg Chest 2 View  04/06/2014   CLINICAL DATA:  LEFT-sided chest pain. Rib pain. Symptoms for 1 week. Sickle cell anemia.  EXAM: CHEST  2 VIEW  COMPARISON:  03/31/2014.  FINDINGS: LEFT subclavian power port unchanged. Cardiopericardial silhouette is enlarged but unchanged compared to prior. There is no airspace consolidation to suggest acute chest syndrome. No pleural effusions. Chronic pulmonary parenchymal scarring associated with sickle cell disease. Bony thoracic spine sickle cell changes better seen on prior CT than on today's radiograph. Enlargement of the pulmonary hila compatible with secondary pulmonary hypertension.  IMPRESSION: Chronic sickle cell changes without findings of acute chest syndrome.   Electronically Signed   By: Dereck Ligas M.D.   On: 04/06/2014 08:13     Labs: Basic Metabolic Panel:  Recent Labs Lab 04/24/14 0520 04/24/14 1011 04/29/14 0750  NA 139 140 134*  K 4.0 4.0 3.7  CL 106 109 104  CO2 24 24 23   GLUCOSE 119* 110* 115*  BUN 11 9 11   CREATININE 0.69 0.61 0.52  CALCIUM 8.9 8.6 8.7   Liver Function Tests:  Recent Labs Lab 04/24/14 0520 04/24/14 1011 04/29/14 0750  AST 60* 55* 48*  ALT 54* 53 44  ALKPHOS 98 107 93  BILITOT 3.7* 3.8* 5.2*  PROT 7.6 7.5 7.9  ALBUMIN 4.3 4.2 4.3   No results for input(s): LIPASE, AMYLASE in the  last 168 hours. No results for input(s): AMMONIA in the last 168 hours. CBC:  Recent Labs Lab 04/24/14 0520 04/24/14 1011 04/29/14 0750  WBC 12.3* 13.3* 13.8*  NEUTROABS 8.3* 9.0* 11.4*  HGB 8.0* 6.7* 7.1*  HCT 24.4* 20.1* 21.1*  MCV 97.6 97.6 95.9  PLT 322 349 572*   Cardiac Enzymes: No results for input(s): CKTOTAL, CKMB, CKMBINDEX, TROPONINI in the last 168 hours. BNP: Invalid input(s): POCBNP CBG: No results for input(s): GLUCAP in the last 168 hours. Ferritin: No results for input(s): FERRITIN in the last 168 hours.  Time coordinating discharge: 40 minutes  Signed:  [name],[name]  04/30/2014, 1:45 PM

## 2014-04-30 NOTE — Telephone Encounter (Signed)
Patient C/O pain to ribs/back/legs that he is rating 8/10 on pain scale.  Patient denies N/V/D, patient denies chest pain or shortness of breath, patient also denies abdominal pain.  Patient states he took all of his medications this morning and took MS Contin around 0600.  Patient is [facility].  I explained I would notify the physician and give him a call.  Patient verbalizes understanding.

## 2014-04-30 NOTE — Progress Notes (Signed)
Patient ID: [name], male   DOB: 1980/06/25, 34 y.o.   MRN: [id number] Discharge instructions given to patient.  Patient verbalizes understanding in making follow up appointment.  Patient states pain is currently 4/10 on pain scale.  Patient states brother is here for transportation.

## 2014-04-30 NOTE — Telephone Encounter (Signed)
Spoke with Dr. Zigmund Daniel and it is ok for patient to come to [facility].  Patient verbalizes understanding

## 2014-04-30 NOTE — Progress Notes (Signed)
Spoke with Dr. Zigmund Daniel in regards to PO dilaudid.  Verified to give PO tablet and leave PCA going for now.  Patient stating paint is currently 5/10.  RN will continue to monitor

## 2014-05-01 ENCOUNTER — Ambulatory Visit: Payer: Self-pay | Admitting: Family Medicine

## 2014-05-02 ENCOUNTER — Telehealth: Payer: Self-pay

## 2014-05-02 DIAGNOSIS — D5700 Hb-SS disease with crisis, unspecified: Secondary | ICD-10-CM

## 2014-05-02 MED ORDER — HYDROMORPHONE HCL 4 MG PO TABS
4.0000 mg | ORAL_TABLET | ORAL | Status: DC | PRN
Start: 2014-05-02 — End: 2014-06-06

## 2014-05-02 NOTE — Telephone Encounter (Signed)
Refill request for Dilaudid 4mg  LOV 02/13/2014. Please advise. Patient is requesting he pick up medication today.

## 2014-05-02 NOTE — Telephone Encounter (Signed)
Prescription written for Dilaudid 4 mg#90 tabs. NCCSRS reviewed and no inconsistencies noted.

## 2014-05-03 ENCOUNTER — Encounter (HOSPITAL_COMMUNITY): Payer: Self-pay | Admitting: Emergency Medicine

## 2014-05-03 ENCOUNTER — Emergency Department (HOSPITAL_COMMUNITY)
Admission: EM | Admit: 2014-05-03 | Discharge: 2014-05-03 | Disposition: A | Discharge status: Home / Self Care | Payer: Medicare Other | Source: Home / Self Care | Attending: Emergency Medicine | Admitting: Emergency Medicine

## 2014-05-03 DIAGNOSIS — Z9114 Patient's other noncompliance with medication regimen: Secondary | ICD-10-CM | POA: Insufficient documentation

## 2014-05-03 DIAGNOSIS — Z96641 Presence of right artificial hip joint: Secondary | ICD-10-CM | POA: Diagnosis present

## 2014-05-03 DIAGNOSIS — Z87891 Personal history of nicotine dependence: Secondary | ICD-10-CM

## 2014-05-03 DIAGNOSIS — Z7901 Long term (current) use of anticoagulants: Secondary | ICD-10-CM

## 2014-05-03 DIAGNOSIS — I10 Essential (primary) hypertension: Secondary | ICD-10-CM | POA: Diagnosis not present

## 2014-05-03 DIAGNOSIS — G894 Chronic pain syndrome: Secondary | ICD-10-CM | POA: Diagnosis present

## 2014-05-03 DIAGNOSIS — F329 Major depressive disorder, single episode, unspecified: Secondary | ICD-10-CM | POA: Diagnosis present

## 2014-05-03 DIAGNOSIS — Z79891 Long term (current) use of opiate analgesic: Secondary | ICD-10-CM

## 2014-05-03 DIAGNOSIS — D5700 Hb-SS disease with crisis, unspecified: Secondary | ICD-10-CM | POA: Diagnosis not present

## 2014-05-03 DIAGNOSIS — Z8659 Personal history of other mental and behavioral disorders: Secondary | ICD-10-CM | POA: Insufficient documentation

## 2014-05-03 DIAGNOSIS — Z833 Family history of diabetes mellitus: Secondary | ICD-10-CM | POA: Diagnosis not present

## 2014-05-03 DIAGNOSIS — Q8901 Asplenia (congenital): Secondary | ICD-10-CM

## 2014-05-03 DIAGNOSIS — I272 Other secondary pulmonary hypertension: Secondary | ICD-10-CM | POA: Diagnosis present

## 2014-05-03 DIAGNOSIS — D599 Acquired hemolytic anemia, unspecified: Secondary | ICD-10-CM | POA: Diagnosis not present

## 2014-05-03 DIAGNOSIS — Z86711 Personal history of pulmonary embolism: Secondary | ICD-10-CM

## 2014-05-03 DIAGNOSIS — M545 Low back pain: Secondary | ICD-10-CM | POA: Diagnosis not present

## 2014-05-03 DIAGNOSIS — Z832 Family history of diseases of the blood and blood-forming organs and certain disorders involving the immune mechanism: Secondary | ICD-10-CM | POA: Diagnosis not present

## 2014-05-03 DIAGNOSIS — D638 Anemia in other chronic diseases classified elsewhere: Secondary | ICD-10-CM | POA: Diagnosis not present

## 2014-05-03 DIAGNOSIS — D72829 Elevated white blood cell count, unspecified: Secondary | ICD-10-CM | POA: Diagnosis present

## 2014-05-03 DIAGNOSIS — R0902 Hypoxemia: Secondary | ICD-10-CM | POA: Diagnosis not present

## 2014-05-03 DIAGNOSIS — D473 Essential (hemorrhagic) thrombocythemia: Secondary | ICD-10-CM | POA: Diagnosis present

## 2014-05-03 DIAGNOSIS — Z79899 Other long term (current) drug therapy: Secondary | ICD-10-CM | POA: Insufficient documentation

## 2014-05-03 LAB — CBC WITH DIFFERENTIAL/PLATELET
Basophils Absolute: 0.1 10*3/uL (ref 0.0–0.1)
Basophils Relative: 1 % (ref 0–1)
Eosinophils Absolute: 0.3 10*3/uL (ref 0.0–0.7)
Eosinophils Relative: 2 % (ref 0–5)
HCT: 20.1 % — ABNORMAL LOW (ref 39.0–52.0)
HEMOGLOBIN: 6.7 g/dL — AB (ref 13.0–17.0)
Lymphocytes Relative: 18 % (ref 12–46)
Lymphs Abs: 2.6 10*3/uL (ref 0.7–4.0)
MCH: 31.8 pg (ref 26.0–34.0)
MCHC: 33.3 g/dL (ref 30.0–36.0)
MCV: 95.3 fL (ref 78.0–100.0)
Monocytes Absolute: 1.6 10*3/uL — ABNORMAL HIGH (ref 0.1–1.0)
Monocytes Relative: 11 % (ref 3–12)
Neutro Abs: 9.8 10*3/uL — ABNORMAL HIGH (ref 1.7–7.7)
Neutrophils Relative %: 68 % (ref 43–77)
Platelets: 682 10*3/uL — ABNORMAL HIGH (ref 150–400)
RBC: 2.11 MIL/uL — ABNORMAL LOW (ref 4.22–5.81)
RDW: 24.2 % — AB (ref 11.5–15.5)
WBC: 14.4 10*3/uL — ABNORMAL HIGH (ref 4.0–10.5)

## 2014-05-03 LAB — COMPREHENSIVE METABOLIC PANEL
ALBUMIN: 3.8 g/dL (ref 3.5–5.2)
ALT: 24 U/L (ref 0–53)
AST: 36 U/L (ref 0–37)
Alkaline Phosphatase: 93 U/L (ref 39–117)
Anion gap: 8 (ref 5–15)
BILIRUBIN TOTAL: 4.6 mg/dL — AB (ref 0.3–1.2)
BUN: 17 mg/dL (ref 6–23)
CALCIUM: 7.9 mg/dL — AB (ref 8.4–10.5)
CHLORIDE: 108 mmol/L (ref 96–112)
CO2: 20 mmol/L (ref 19–32)
CREATININE: 1.02 mg/dL (ref 0.50–1.35)
GFR calc non Af Amer: >90 mL/min (ref >90)
GLUCOSE: 123 mg/dL — AB (ref 70–99)
Potassium: 3.6 mmol/L (ref 3.5–5.1)
Sodium: 136 mmol/L (ref 135–145)
Total Protein: 7 g/dL (ref 6.0–8.3)

## 2014-05-03 LAB — RETICULOCYTES
RBC.: 2.11 MIL/uL — ABNORMAL LOW (ref 4.22–5.81)
RETIC COUNT ABSOLUTE: 208.9 10*3/uL — AB (ref 19.0–186.0)
Retic Ct Pct: 9.9 % — ABNORMAL HIGH (ref 0.4–3.1)

## 2014-05-03 MED ORDER — SODIUM CHLORIDE 0.9 % IV BOLUS (SEPSIS)
1000.0000 mL | Freq: Once | INTRAVENOUS | Status: AC
Start: 2014-05-03 — End: 2014-05-03
  Administered 2014-05-03: 1000 mL via INTRAVENOUS

## 2014-05-03 MED ORDER — DIPHENHYDRAMINE HCL 50 MG/ML IJ SOLN
25.0000 mg | Freq: Once | INTRAMUSCULAR | Status: AC
  Administered 2014-05-03: 25 mg via INTRAVENOUS
  Filled 2014-05-03: qty 1

## 2014-05-03 MED ORDER — HYDROMORPHONE HCL 1 MG/ML IJ SOLN
1.0000 mg | INTRAMUSCULAR | Status: DC | PRN
  Administered 2014-05-03 (×3): 1 mg via INTRAVENOUS
  Filled 2014-05-03 (×3): qty 1

## 2014-05-03 MED ORDER — HEPARIN SOD (PORK) LOCK FLUSH 100 UNIT/ML IV SOLN
500.0000 [IU] | Freq: Once | INTRAVENOUS | Status: AC
  Administered 2014-05-03: 500 [IU]
  Filled 2014-05-03: qty 5

## 2014-05-03 NOTE — Discharge Instructions (Signed)
Sickle Cell Disease °Sickle cell disease is also known as sickle cell anemia. This condition affects red blood cells (RBCs). Sickle cell disease is a genetically inherited disorder. RBCs are important in the body, because they contain hemoglobin. Hemoglobin carries oxygen to all of the tissues in the body. People who suffer from sickle cell disease have abnormally shaped hemoglobin molecules that look like sickles, and they cannot carry oxygen as well as normal hemoglobin molecules. The sickle cells also have a chemical on their surface that causes them to stick to vessel walls, so they have a more difficult time squeezing through small vessels. Sickle cells also have a shorter lifespan compared to normal RBCs. This means that the body's bone marrow, where RBCs are produced, must work harder to try and make RBCs faster than the die. However, for many individuals suffering from sickle cells disease, there bone marrow is not efficient enough. The resulting condition is known as anemia (a low number of RBCs). The severity of sickle cell disease depends on many factors, including, oxygen deprivation, concentration of hemoglobin, and the amount of a protective molecule called hemoglobin F (F for fetal). The people with greater amounts of hemoglobin F are better protected. °RISK FACTORS  °· Family members with sickle cell disease (both parents have to have the abnormal gene). °· Illness. °· Exertion. °· Dehydration. °· Family origins in areas with high incidence of malaria ([place], [place], the [place], [place] and [place], the [place], and the [place]). °· Physical stress. °· High altitude. °SYMPTOMS  °· Fever. °· Swelling of the hands and feet. °· Enlargement of the belly (heart, liver, and spleen). °· Frequent lung infections. °· Fatigue. °· Irritability. °· Yellowing of the skin (jaundice). °· Severe bone and joint pain. °· Delayed puberty. °· Shortness of breath. °· Pain in the belly, especially in  the upper right side of the abdomen. °· Nausea. °· Prolonged, sometimes painful erections (priapism). °· Rapid or labored breathing. °· Frequent infections. °PREVENTION  °· There are no proven methods for preventing sickle cell crises. °· Regularly follow up with your doctor. °· Avoid dehydration. °· Avoid high-altitude travel, especially rapid increases in altitude. °· Avoid stress. °· Get plenty of rest. °· Stay warm. °· Male patients may drink cranberry juice to help prevent urinary tract infections. °· Maintain good nutrition by eating green, red, and yellow vegetables; fruits; and juices that are rich in antioxidants and other important nutrients. °· Eat fish and soy products that are high in omega-three fatty acids. °· Make sure you consume enough folic acid, zinc, vitamin E, vitamin C, and L-glutamine. °· Blood transfusions. °· Immunizations, particularly against the flu and some bacteria, may reduce the risk of infection and thus sickle crisis. °TREATMENT °If sickle cell disease causes painful symptoms, then over-the-counter pain medications (i.e., acetaminophen or ibuprofen) may be used, but consult your caregiver before use. Sickle cell disease is treated by managing pain and maintaining hydration. For patients with shortness of breath or difficulty breathing, oxygen may be given. Children who are at high risk for the disease may be give blood transfusions. Other medications exist to help the body produce protective hemoglobin. It is important to discuss these with your caregiver. °Document Released: 01/17/2005 Document Revised: 06/03/2013 Document Reviewed: 05/01/2008 °[employer]® Patient Information ©2015 [employer]. This information is not intended to replace advice given to you by your health care provider. Make sure you discuss any questions you have with your health care provider. ° °

## 2014-05-03 NOTE — ED Notes (Signed)
Pt reports sickle cell crisis pain for past week in bilateral flank area. Location typical for crisis pains. No n/v/d.

## 2014-05-03 NOTE — ED Provider Notes (Signed)
CSN: [id number]     Arrival date & time 05/03/14  0806 History   First MD Initiated Contact with Patient 05/03/14 [phone]     Chief Complaint  Patient presents with  . Sickle Cell Pain Crisis     (Consider location/radiation/quality/duration/timing/severity/associated sxs/prior Treatment) HPI   34 year old with history of sickle cell who presents complaining of low back pain. Report persistent pain to bilateral low back radiates to his sides ongoing for about a week. Pain is described as a throbbing sensation, similar to prior sickle cell related pain. Pain not improved with taking his pain medication which he ran out 2 days ago. Worsening pain today. no associated fever, headache, URI symptoms, chest pain, shortness of breath, productive cough, dysuria, hematuria, or rash. Denies any recent injury. Denies any precipitating symptoms.patient states he's hoping that the pain can be controlled so he can follow-up with his sickle cell clinic today.  Past Medical History  Diagnosis Date  . Sickle cell anemia   . Blood transfusion   . Acute embolism and thrombosis of right internal jugular vein   . Hypokalemia   . Mood disorder   . History of pulmonary embolus (PE)   . Avascular necrosis   . Leukocytosis     Chronic  . Thrombocytosis     Chronic  . Hypertension   . History of Clostridium difficile infection   . Uses marijuana   . Chronic anticoagulation   . Functional asplenia   . Former smoker   . Second hand tobacco smoke exposure   . Alcohol consumption of one to four drinks per day   . Noncompliance with medication regimen   . Sickle-cell crisis with associated acute chest syndrome 05/13/2013  . Acute chest syndrome 06/18/2013  . Demand ischemia 01/02/2014   Past Surgical History  Procedure Laterality Date  . Right hip replacement      08/2006  . Cholecystectomy      01/2008  . Porta cath placement    . Porta cath removal    . Umbilical hernia repair      01/2008  . Excision  of left periauricular cyst      10/2009  . Excision of right ear lobe cyst with primary closur      11/2007  . Portacath placement  01/05/2012    Procedure: INSERTION PORT-A-CATH;  Surgeon: Odis Hollingshead, MD;  Location: [place];  Service: General;  Laterality: N/A;  ultrasound guiced port a cath insertion with fluoroscopy   Family History  Problem Relation Age of Onset  . Sickle cell trait Mother   . Depression Mother   . Diabetes Mother   . Sickle cell trait Father   . Sickle cell trait Brother    History  Substance Use Topics  . Smoking status: Former Smoker -- 13 years    Quit date: 07/08/2010  . Smokeless tobacco: Never Used  . Alcohol Use: No    Review of Systems  All other systems reviewed and are negative.     Allergies  Review of patient's allergies indicates no known allergies.  Home Medications   Prior to Admission medications   Medication Sig Start Date End Date Taking? Authorizing Provider  Cholecalciferol (VITAMIN D) 2000 UNITS tablet Take 1 tablet (2,000 Units total) by mouth daily. 02/13/14   [name], MD  diltiazem (CARDIZEM CD) 120 MG 24 hr capsule Take 1 capsule (120 mg total) by mouth daily. 03/13/14   Costin Karlyne Greenspan, MD  folic acid (FOLVITE)  1 MG tablet Take 1 tablet (1 mg total) by mouth every morning. 10/08/13   [name], MD  HYDROmorphone (DILAUDID) 4 MG tablet Take 1 tablet (4 mg total) by mouth every 4 (four) hours as needed for severe pain. 05/02/14   [name], MD  hydroxyurea (HYDREA) 500 MG capsule Take 3 capsules (1,500 mg total) by mouth daily. May take with food to minimize GI side effects. 10/30/13   [name], MD  lisinopril (PRINIVIL,ZESTRIL) 10 MG tablet Take 1 tablet (10 mg total) by mouth daily. 01/22/14   [name], MD  metoprolol tartrate (LOPRESSOR) 25 MG tablet Take 1 tablet (25 mg total) by mouth daily. 08/22/13   [name], MD  morphine (MS CONTIN) 30 MG 12 hr tablet Take 1  tablet (30 mg total) by mouth every 12 (twelve) hours. 04/15/14   [name], MD  potassium chloride SA (K-DUR,KLOR-CON) 20 MEQ tablet Take 1 tablet (20 mEq total) by mouth every morning. 09/25/13   [name], MD  rivaroxaban (XARELTO) 20 MG TABS tablet Take 1 tablet (20 mg total) by mouth every morning. 03/13/14   Costin Karlyne Greenspan, MD  zolpidem (AMBIEN) 10 MG tablet Take 1 tablet (10 mg total) by mouth at bedtime as needed for sleep. 04/01/14   [name], MD   BP 114/72 mmHg  Pulse 97  Temp(Src) 98.5 F (36.9 C) (Oral)  Resp 20  SpO2 94% Physical Exam  Constitutional: He appears well-developed and well-nourished. No distress.  HENT:  Head: Atraumatic.  Eyes: Conjunctivae are normal.  Neck: Normal range of motion. Neck supple.  Cardiovascular: Normal rate and regular rhythm.   Pulmonary/Chest: Effort normal and breath sounds normal. He exhibits no tenderness.  Port access site to left upper chest, no evidence of infection.  Musculoskeletal:  No significant midline spine tenderness. Tenderness to bilateral paraspinal muscle to bone thoracic and lumbar region on palpation.  Neurological: He is alert.  Skin: No rash noted.  Psychiatric: He has a normal mood and affect.  Nursing note and vitals reviewed.   ED Course  Procedures (including critical care time)  Sickle cell related pain.  No evidence to suggest acute chest or other complicated factors.  Will provide pain management.   10:04 AM Labs at baseline, patient receive adequate pain medication in the ER and felt better. He does have an appointment follow-up with his sickle cell clinic next week. Return precautions discussed. Otherwise patient is stable for discharge.   Labs Review Labs Reviewed  CBC WITH DIFFERENTIAL/PLATELET - Abnormal; Notable for the following:    WBC 14.4 (*)    RBC 2.11 (*)    Hemoglobin 6.7 (*)    HCT 20.1 (*)    RDW 24.2 (*)    Platelets 682 (*)    All other components  within normal limits  COMPREHENSIVE METABOLIC PANEL - Abnormal; Notable for the following:    Glucose, Bld 123 (*)    Calcium 7.9 (*)    Total Bilirubin 4.6 (*)    All other components within normal limits  RETICULOCYTES - Abnormal; Notable for the following:    Retic Ct Pct 9.9 (*)    RBC. 2.11 (*)    Retic Count, Manual 208.9 (*)    All other components within normal limits    Imaging Review No results found.   EKG Interpretation None      MDM   Final diagnoses:  Sickle cell anemia with crisis  BP 110/67 mmHg  Pulse 70  Temp(Src) 98.5 F (36.9 C) (Oral)  Resp 16  SpO2 98%  I have reviewed nursing notes and vital signs. I personally reviewed the imaging tests through PACS system  I reviewed available ER/hospitalization records thought the EMR     Domenic Moras, PA-C 05/03/14 [place], MD 05/03/14 1504

## 2014-05-03 NOTE — ED Notes (Signed)
Pt reports bilateral lower back pain. Pt denies urinary symptoms.

## 2014-05-04 ENCOUNTER — Encounter (HOSPITAL_COMMUNITY): Payer: Self-pay | Admitting: Emergency Medicine

## 2014-05-04 ENCOUNTER — Inpatient Hospital Stay (HOSPITAL_COMMUNITY)
Admission: EM | Admit: 2014-05-04 | Discharge: 2014-05-07 | DRG: 812 | Disposition: A | Discharge status: Home / Self Care | Payer: Medicare Other | Attending: Internal Medicine | Admitting: Internal Medicine

## 2014-05-04 DIAGNOSIS — D5700 Hb-SS disease with crisis, unspecified: Secondary | ICD-10-CM | POA: Diagnosis present

## 2014-05-04 DIAGNOSIS — G894 Chronic pain syndrome: Secondary | ICD-10-CM | POA: Diagnosis present

## 2014-05-04 DIAGNOSIS — F1290 Cannabis use, unspecified, uncomplicated: Secondary | ICD-10-CM | POA: Diagnosis present

## 2014-05-04 LAB — CBC WITH DIFFERENTIAL/PLATELET
Basophils Absolute: 0.2 10*3/uL — ABNORMAL HIGH (ref 0.0–0.1)
Basophils Relative: 1 % (ref 0–1)
EOS ABS: 0.2 10*3/uL (ref 0.0–0.7)
EOS PCT: 1 % (ref 0–5)
HCT: 21.8 % — ABNORMAL LOW (ref 39.0–52.0)
Hemoglobin: 7.3 g/dL — ABNORMAL LOW (ref 13.0–17.0)
LYMPHS ABS: 1.5 10*3/uL (ref 0.7–4.0)
Lymphocytes Relative: 9 % — ABNORMAL LOW (ref 12–46)
MCH: 31.7 pg (ref 26.0–34.0)
MCHC: 33.5 g/dL (ref 30.0–36.0)
MCV: 94.8 fL (ref 78.0–100.0)
Monocytes Absolute: 1.3 10*3/uL — ABNORMAL HIGH (ref 0.1–1.0)
Monocytes Relative: 8 % (ref 3–12)
NEUTROS PCT: 81 % — AB (ref 43–77)
Neutro Abs: 13.6 10*3/uL — ABNORMAL HIGH (ref 1.7–7.7)
PLATELETS: 682 10*3/uL — AB (ref 150–400)
RBC: 2.3 MIL/uL — AB (ref 4.22–5.81)
RDW: 24.5 % — ABNORMAL HIGH (ref 11.5–15.5)
WBC: 16.8 10*3/uL — ABNORMAL HIGH (ref 4.0–10.5)

## 2014-05-04 LAB — COMPREHENSIVE METABOLIC PANEL
ALT: 24 U/L (ref 0–53)
AST: 39 U/L — ABNORMAL HIGH (ref 0–37)
Albumin: 4.3 g/dL (ref 3.5–5.2)
Alkaline Phosphatase: 84 U/L (ref 39–117)
Anion gap: 7 (ref 5–15)
BUN: 13 mg/dL (ref 6–23)
CALCIUM: 9.2 mg/dL (ref 8.4–10.5)
CHLORIDE: 110 mmol/L (ref 96–112)
CO2: 20 mmol/L (ref 19–32)
CREATININE: 0.68 mg/dL (ref 0.50–1.35)
GFR calc non Af Amer: >90 mL/min (ref >90)
GLUCOSE: 99 mg/dL (ref 70–99)
POTASSIUM: 3.9 mmol/L (ref 3.5–5.1)
Sodium: 137 mmol/L (ref 135–145)
Total Bilirubin: 4.9 mg/dL — ABNORMAL HIGH (ref 0.3–1.2)
Total Protein: 7.8 g/dL (ref 6.0–8.3)

## 2014-05-04 LAB — CBC
HCT: 19.7 % — ABNORMAL LOW (ref 39.0–52.0)
HEMOGLOBIN: 6.7 g/dL — AB (ref 13.0–17.0)
MCH: 32.2 pg (ref 26.0–34.0)
MCHC: 34 g/dL (ref 30.0–36.0)
MCV: 94.7 fL (ref 78.0–100.0)
Platelets: 612 10*3/uL — ABNORMAL HIGH (ref 150–400)
RBC: 2.08 MIL/uL — AB (ref 4.22–5.81)
RDW: 24.2 % — ABNORMAL HIGH (ref 11.5–15.5)
WBC: 20.1 10*3/uL — ABNORMAL HIGH (ref 4.0–10.5)

## 2014-05-04 LAB — CREATININE, SERUM
Creatinine, Ser: 0.61 mg/dL (ref 0.50–1.35)
GFR calc Af Amer: >90 mL/min (ref >90)
GFR calc non Af Amer: >90 mL/min (ref >90)

## 2014-05-04 LAB — MAGNESIUM: Magnesium: 1.7 mg/dL (ref 1.5–2.5)

## 2014-05-04 LAB — RETICULOCYTES
RBC.: 2.3 MIL/uL — ABNORMAL LOW (ref 4.22–5.81)
RETIC CT PCT: 10.6 % — AB (ref 0.4–3.1)
Retic Count, Absolute: 243.8 10*3/uL — ABNORMAL HIGH (ref 19.0–186.0)

## 2014-05-04 LAB — LACTATE DEHYDROGENASE: LDH: 394 U/L — AB (ref 94–250)

## 2014-05-04 MED ORDER — FOLIC ACID 1 MG PO TABS
1.0000 mg | ORAL_TABLET | Freq: Every morning | ORAL | Status: DC
  Administered 2014-05-04 – 2014-05-07 (×4): 1 mg via ORAL
  Filled 2014-05-04 (×4): qty 1

## 2014-05-04 MED ORDER — DIPHENHYDRAMINE HCL 12.5 MG/5ML PO ELIX
12.5000 mg | ORAL_SOLUTION | Freq: Four times a day (QID) | ORAL | Status: DC | PRN
  Administered 2014-05-04: 12.5 mg via ORAL
  Filled 2014-05-04: qty 5

## 2014-05-04 MED ORDER — HYDROMORPHONE 2 MG/ML HIGH CONCENTRATION IV PCA SOLN
INTRAVENOUS | Status: DC
  Administered 2014-05-04: 10.5 mg via INTRAVENOUS
  Administered 2014-05-04: 25 mg via INTRAVENOUS
  Administered 2014-05-04: 13.2 mg via INTRAVENOUS
  Administered 2014-05-05: 7.8 mg via INTRAVENOUS
  Administered 2014-05-05: 6.6 mg via INTRAVENOUS
  Administered 2014-05-05: 1.2 mg via INTRAVENOUS
  Administered 2014-05-05: 2.8 mg via INTRAVENOUS
  Administered 2014-05-05: 12 mg via INTRAVENOUS
  Administered 2014-05-05: 11 mg via INTRAVENOUS
  Administered 2014-05-05: 13.8 mL via INTRAVENOUS
  Administered 2014-05-06: 7.2 mg via INTRAVENOUS
  Administered 2014-05-06: 7.8 mg via INTRAVENOUS
  Administered 2014-05-06 (×2): 1.8 mg via INTRAVENOUS
  Administered 2014-05-06: 5.2 mg via INTRAVENOUS
  Administered 2014-05-06: 9.6 mg via INTRAVENOUS
  Administered 2014-05-07: 9 mg via INTRAVENOUS
  Administered 2014-05-07: 9.6 mg via INTRAVENOUS
  Administered 2014-05-07: 8.8 mg via INTRAVENOUS
  Filled 2014-05-04 (×3): qty 25

## 2014-05-04 MED ORDER — SODIUM CHLORIDE 0.9 % IV BOLUS (SEPSIS)
1000.0000 mL | Freq: Once | INTRAVENOUS | Status: AC
  Administered 2014-05-04: 1000 mL via INTRAVENOUS

## 2014-05-04 MED ORDER — SODIUM CHLORIDE 0.9 % IV SOLN
12.5000 mg | Freq: Four times a day (QID) | INTRAVENOUS | Status: DC | PRN
  Administered 2014-05-04 – 2014-05-07 (×6): 12.5 mg via INTRAVENOUS
  Filled 2014-05-04 (×12): qty 0.25

## 2014-05-04 MED ORDER — MORPHINE SULFATE ER 30 MG PO TBCR
30.0000 mg | EXTENDED_RELEASE_TABLET | Freq: Two times a day (BID) | ORAL | Status: DC
  Administered 2014-05-04 – 2014-05-07 (×7): 30 mg via ORAL
  Filled 2014-05-04 (×7): qty 1

## 2014-05-04 MED ORDER — SENNOSIDES-DOCUSATE SODIUM 8.6-50 MG PO TABS
1.0000 | ORAL_TABLET | Freq: Two times a day (BID) | ORAL | Status: DC
  Administered 2014-05-05 – 2014-05-07 (×4): 1 via ORAL
  Filled 2014-05-04 (×6): qty 1

## 2014-05-04 MED ORDER — RIVAROXABAN 20 MG PO TABS
20.0000 mg | ORAL_TABLET | Freq: Every day | ORAL | Status: DC
Start: 2014-05-04 — End: 2014-05-07
  Administered 2014-05-04 – 2014-05-06 (×3): 20 mg via ORAL
  Filled 2014-05-04 (×4): qty 1

## 2014-05-04 MED ORDER — METOPROLOL TARTRATE 25 MG PO TABS
25.0000 mg | ORAL_TABLET | Freq: Every day | ORAL | Status: DC
  Administered 2014-05-04 – 2014-05-07 (×4): 25 mg via ORAL
  Filled 2014-05-04 (×4): qty 1

## 2014-05-04 MED ORDER — POTASSIUM CHLORIDE CRYS ER 20 MEQ PO TBCR
20.0000 meq | EXTENDED_RELEASE_TABLET | Freq: Every morning | ORAL | Status: DC
  Administered 2014-05-04 – 2014-05-07 (×4): 20 meq via ORAL
  Filled 2014-05-04 (×4): qty 1

## 2014-05-04 MED ORDER — POLYETHYLENE GLYCOL 3350 17 G PO PACK: 17.0000 g | PACK | Freq: Every day | ORAL | Status: DC | PRN

## 2014-05-04 MED ORDER — DEXTROSE-NACL 5-0.45 % IV SOLN
INTRAVENOUS | Status: DC
  Administered 2014-05-04: 1000 mL via INTRAVENOUS
  Administered 2014-05-05 – 2014-05-06 (×4): via INTRAVENOUS

## 2014-05-04 MED ORDER — ZOLPIDEM TARTRATE 10 MG PO TABS
10.0000 mg | ORAL_TABLET | Freq: Every evening | ORAL | Status: DC | PRN
  Administered 2014-05-05 – 2014-05-07 (×3): 10 mg via ORAL
  Filled 2014-05-04 (×3): qty 1

## 2014-05-04 MED ORDER — LISINOPRIL 10 MG PO TABS
10.0000 mg | ORAL_TABLET | Freq: Every day | ORAL | Status: DC
  Administered 2014-05-04 – 2014-05-07 (×4): 10 mg via ORAL
  Filled 2014-05-04 (×4): qty 1

## 2014-05-04 MED ORDER — HYDROXYUREA 500 MG PO CAPS
1500.0000 mg | ORAL_CAPSULE | Freq: Every day | ORAL | Status: DC
  Administered 2014-05-04 – 2014-05-07 (×4): 1500 mg via ORAL
  Filled 2014-05-04 (×4): qty 3

## 2014-05-04 MED ORDER — VITAMIN D 1000 UNITS PO TABS
2000.0000 [IU] | ORAL_TABLET | Freq: Every day | ORAL | Status: DC
  Administered 2014-05-04 – 2014-05-07 (×4): 2000 [IU] via ORAL
  Filled 2014-05-04 (×4): qty 2

## 2014-05-04 MED ORDER — DILTIAZEM HCL ER COATED BEADS 120 MG PO CP24
120.0000 mg | ORAL_CAPSULE | Freq: Every day | ORAL | Status: DC
  Administered 2014-05-04 – 2014-05-07 (×4): 120 mg via ORAL
  Filled 2014-05-04 (×4): qty 1

## 2014-05-04 MED ORDER — ONDANSETRON HCL 4 MG/2ML IJ SOLN: 4.0000 mg | Freq: Four times a day (QID) | INTRAMUSCULAR | Status: DC | PRN

## 2014-05-04 MED ORDER — HYDROMORPHONE HCL 2 MG/ML IJ SOLN
2.0000 mg | INTRAMUSCULAR | Status: DC | PRN
  Administered 2014-05-04 – 2014-05-05 (×5): 2 mg via INTRAVENOUS
  Filled 2014-05-04 (×5): qty 1

## 2014-05-04 MED ORDER — NALOXONE HCL 0.4 MG/ML IJ SOLN: 0.4000 mg | INTRAMUSCULAR | Status: DC | PRN

## 2014-05-04 MED ORDER — KETOROLAC TROMETHAMINE 30 MG/ML IJ SOLN
30.0000 mg | Freq: Four times a day (QID) | INTRAMUSCULAR | Status: DC
Start: 2014-05-04 — End: 2014-05-07
  Administered 2014-05-04 – 2014-05-07 (×10): 30 mg via INTRAVENOUS
  Filled 2014-05-04 (×18): qty 1

## 2014-05-04 MED ORDER — SODIUM CHLORIDE 0.9 % IJ SOLN: 9.0000 mL | INTRAMUSCULAR | Status: DC | PRN

## 2014-05-04 NOTE — ED Notes (Signed)
Pt c/o pain to ribs, back and legs, took Dilaudid 0300am 4mg  x 2 PO

## 2014-05-04 NOTE — Progress Notes (Signed)
CRITICAL VALUE ALERT  Critical value received:  HGB 6.7   Date of notification:  05/04/2014   Time of notification:  1:43 PM   Critical value read back:Yes.    Nurse who received alert:  Joaquin Courts   MD notified (1st page):  Dr Jonelle Sidle  Time of first page:  1:43 PM   MD notified (2nd page):  Time of second page:  Responding MD:  Jonelle Sidle  Time MD responded:  1:44 PM

## 2014-05-04 NOTE — Plan of Care (Signed)
Problem: Phase I Progression Outcomes Goal: Pulmonary Hygiene as Indicated (Sickle Cell) Outcome: Progressing Incentive spirometer

## 2014-05-04 NOTE — ED Notes (Signed)
Pt upset that he has received all the pain med that he is supposed to per care plan. Bowie to talk to pt about pain control and admit

## 2014-05-04 NOTE — ED Provider Notes (Signed)
CSN: [id number]     Arrival date & time 05/04/14  8003 History   First MD Initiated Contact with Patient 05/04/14 [phone]     Chief Complaint  Patient presents with  . Sickle Cell Pain Crisis     (Consider location/radiation/quality/duration/timing/severity/associated sxs/prior Treatment) HPI   34 year old male with history of sickle cell anemia who presents complaining of pain to his ribs, back and legs that is similar to his prior sickle cell crisis. Pain is described as sharp throbbing, achy, persistent, unrelieved with taking his home medication including Dilaudid. He was seen in the ER for similar complaint yesterday and received treatment by me and was discharged in good condition. He however states that his pain woke him up this morning and has been persistent since. Denies fever, chills, productive cough, shortness of breath, chest pain. Patient felt he may need admission at this time.  Past Medical History  Diagnosis Date  . Sickle cell anemia   . Blood transfusion   . Acute embolism and thrombosis of right internal jugular vein   . Hypokalemia   . Mood disorder   . History of pulmonary embolus (PE)   . Avascular necrosis   . Leukocytosis     Chronic  . Thrombocytosis     Chronic  . Hypertension   . History of Clostridium difficile infection   . Uses marijuana   . Chronic anticoagulation   . Functional asplenia   . Former smoker   . Second hand tobacco smoke exposure   . Alcohol consumption of one to four drinks per day   . Noncompliance with medication regimen   . Sickle-cell crisis with associated acute chest syndrome 05/13/2013  . Acute chest syndrome 06/18/2013  . Demand ischemia 01/02/2014   Past Surgical History  Procedure Laterality Date  . Right hip replacement      08/2006  . Cholecystectomy      01/2008  . Porta cath placement    . Porta cath removal    . Umbilical hernia repair      01/2008  . Excision of left periauricular cyst      10/2009  . Excision  of right ear lobe cyst with primary closur      11/2007  . Portacath placement  01/05/2012    Procedure: INSERTION PORT-A-CATH;  Surgeon: Odis Hollingshead, MD;  Location: [place];  Service: General;  Laterality: N/A;  ultrasound guiced port a cath insertion with fluoroscopy   Family History  Problem Relation Age of Onset  . Sickle cell trait Mother   . Depression Mother   . Diabetes Mother   . Sickle cell trait Father   . Sickle cell trait Brother    History  Substance Use Topics  . Smoking status: Former Smoker -- 13 years    Quit date: 07/08/2010  . Smokeless tobacco: Never Used  . Alcohol Use: No    Review of Systems  All other systems reviewed and are negative.     Allergies  Review of patient's allergies indicates no known allergies.  Home Medications   Prior to Admission medications   Medication Sig Start Date End Date Taking? Authorizing Provider  Cholecalciferol (VITAMIN D) 2000 UNITS tablet Take 1 tablet (2,000 Units total) by mouth daily. 02/13/14   [name], MD  diltiazem (CARDIZEM CD) 120 MG 24 hr capsule Take 1 capsule (120 mg total) by mouth daily. 03/13/14   Costin Karlyne Greenspan, MD  folic acid (FOLVITE) 1 MG tablet Take 1  tablet (1 mg total) by mouth every morning. 10/08/13   [name], MD  HYDROmorphone (DILAUDID) 4 MG tablet Take 1 tablet (4 mg total) by mouth every 4 (four) hours as needed for severe pain. 05/02/14   [name], MD  hydroxyurea (HYDREA) 500 MG capsule Take 3 capsules (1,500 mg total) by mouth daily. May take with food to minimize GI side effects. 10/30/13   [name], MD  lisinopril (PRINIVIL,ZESTRIL) 10 MG tablet Take 1 tablet (10 mg total) by mouth daily. 01/22/14   [name], MD  metoprolol tartrate (LOPRESSOR) 25 MG tablet Take 1 tablet (25 mg total) by mouth daily. 08/22/13   [name], MD  morphine (MS CONTIN) 30 MG 12 hr tablet Take 1 tablet (30 mg total) by mouth every 12 (twelve) hours.  04/15/14   [name], MD  potassium chloride SA (K-DUR,KLOR-CON) 20 MEQ tablet Take 1 tablet (20 mEq total) by mouth every morning. 09/25/13   [name], MD  rivaroxaban (XARELTO) 20 MG TABS tablet Take 1 tablet (20 mg total) by mouth every morning. 03/13/14   Costin Karlyne Greenspan, MD  zolpidem (AMBIEN) 10 MG tablet Take 1 tablet (10 mg total) by mouth at bedtime as needed for sleep. 04/01/14   [name], MD   BP 136/82 mmHg  Pulse 99  Temp(Src) 98.2 F (36.8 C) (Oral)  Resp 17  Ht 6' (1.829 m)  Wt 164 lb (74.39 kg)  BMI 22.24 kg/m2  SpO2 97% Physical Exam  Constitutional: He is oriented to person, place, and time. He appears well-developed and well-nourished. No distress.  HENT:  Head: Atraumatic.  Eyes: Conjunctivae are normal.  Neck: Normal range of motion. Neck supple.  Cardiovascular: Normal rate, regular rhythm and intact distal pulses.   Pulmonary/Chest: Effort normal and breath sounds normal. No respiratory distress.  Left upper chest port access without evidence of infection.  Abdominal: Soft. There is no tenderness.  Musculoskeletal: He exhibits tenderness (Diffuse tenderness throughout chest wall, arms, legs without focal point tenderness. Full range of motion throughout. No overlying skin changes.). He exhibits no edema.  Neurological: He is alert and oriented to person, place, and time.  Skin: No rash noted.  Psychiatric: He has a normal mood and affect.    ED Course  Procedures (including critical care time)  Patient here with pain similar to prior sickle cell pain. Was evaluated by me yesterday for the same complaint and pain was controlled while in the ER however report pain has worsened this morning. Plan to admit if pain is not adequately controlled. No red flags concerning for condition complicated by sickle cell such as acute chest.   9:31 AM Pt received a total of 6mg  of dilaudid but report pain is still moderate.  Will consult for  admission.  He Hgb has improved from prior, but increased leukocytosis.    9:41 AM Appreciated consult from [facility] Dr. Jonelle Sidle who agrees to admit pt for further management.    Labs Review Labs Reviewed  CBC WITH DIFFERENTIAL/PLATELET - Abnormal; Notable for the following:    WBC 16.8 (*)    RBC 2.30 (*)    Hemoglobin 7.3 (*)    HCT 21.8 (*)    RDW 24.5 (*)    Platelets 682 (*)    Neutrophils Relative % 81 (*)    Lymphocytes Relative 9 (*)    Neutro Abs 13.6 (*)    Monocytes Absolute 1.3 (*)  Basophils Absolute 0.2 (*)    All other components within normal limits  COMPREHENSIVE METABOLIC PANEL - Abnormal; Notable for the following:    AST 39 (*)    Total Bilirubin 4.9 (*)    All other components within normal limits  RETICULOCYTES - Abnormal; Notable for the following:    Retic Ct Pct 10.6 (*)    RBC. 2.30 (*)    Retic Count, Manual 243.8 (*)    All other components within normal limits    Imaging Review No results found.   EKG Interpretation None      MDM   Final diagnoses:  Sickle cell anemia with crisis    BP 130/85 mmHg  Pulse 94  Temp(Src) 98.2 F (36.8 C) (Oral)  Resp 21  Ht 6' (1.829 m)  Wt 164 lb (74.39 kg)  BMI 22.24 kg/m2  SpO2 94%     Domenic Moras, PA-C 05/04/14 [place], MD 05/04/14 2248

## 2014-05-04 NOTE — ED Notes (Signed)
Attempted to call report to floor. RN was unable to take report and will call back when she can take report

## 2014-05-04 NOTE — H&P (Signed)
[name] is an 34 y.o. male.   Chief Complaint: Pain in legs and arms HPI: A 34 yo with known sickle cell disease on chronic Narcotics daily who is Opiate Tolerant here with pain at 9/10 in his lower back and legs. Has been taking his home medications but no relief. Pain felt like his typical sickle cell pain. No SOB, no Cough, No NVD. Pain is worsened by movement and activities and mildly relieved by his home medications. He was in the ER yesterday and was treated for 8 hours and left but had to come back today because pain has persisted and not relieved.  Past Medical History  Diagnosis Date  . Sickle cell anemia   . Blood transfusion   . Acute embolism and thrombosis of right internal jugular vein   . Hypokalemia   . Mood disorder   . History of pulmonary embolus (PE)   . Avascular necrosis   . Leukocytosis     Chronic  . Thrombocytosis     Chronic  . Hypertension   . History of Clostridium difficile infection   . Uses marijuana   . Chronic anticoagulation   . Functional asplenia   . Former smoker   . Second hand tobacco smoke exposure   . Alcohol consumption of one to four drinks per day   . Noncompliance with medication regimen   . Sickle-cell crisis with associated acute chest syndrome 05/13/2013  . Acute chest syndrome 06/18/2013  . Demand ischemia 01/02/2014    Past Surgical History  Procedure Laterality Date  . Right hip replacement      08/2006  . Cholecystectomy      01/2008  . Porta cath placement    . Porta cath removal    . Umbilical hernia repair      01/2008  . Excision of left periauricular cyst      10/2009  . Excision of right ear lobe cyst with primary closur      11/2007  . Portacath placement  01/05/2012    Procedure: INSERTION PORT-A-CATH;  Surgeon: Odis Hollingshead, MD;  Location: [place];  Service: General;  Laterality: N/A;  ultrasound guiced port a cath insertion with fluoroscopy    Family History  Problem Relation Age of Onset  . Sickle  cell trait Mother   . Depression Mother   . Diabetes Mother   . Sickle cell trait Father   . Sickle cell trait Brother    Social History:  reports that he quit smoking about 3 years ago. He has never used smokeless tobacco. He reports that he uses illicit drugs (Marijuana) about twice per week. He reports that he does not drink alcohol.  Allergies: No Known Allergies   (Not in a hospital admission)  Results for orders placed or performed during the hospital encounter of 05/04/14 (from the past 48 hour(s))  CBC with Differential/Platelet     Status: Abnormal   Collection Time: 05/04/14  6:50 AM  Result Value Ref Range   WBC 16.8 (H) 4.0 - 10.5 K/uL   RBC 2.30 (L) 4.22 - 5.81 MIL/uL   Hemoglobin 7.3 (L) 13.0 - 17.0 g/dL   HCT 21.8 (L) 39.0 - 52.0 %   MCV 94.8 78.0 - 100.0 fL   MCH 31.7 26.0 - 34.0 pg   MCHC 33.5 30.0 - 36.0 g/dL   RDW 24.5 (H) 11.5 - 15.5 %   Platelets 682 (H) 150 - 400 K/uL   Neutrophils Relative % 81 (H)  43 - 77 %   Lymphocytes Relative 9 (L) 12 - 46 %   Monocytes Relative 8 3 - 12 %   Eosinophils Relative 1 0 - 5 %   Basophils Relative 1 0 - 1 %   Neutro Abs 13.6 (H) 1.7 - 7.7 K/uL   Lymphs Abs 1.5 0.7 - 4.0 K/uL   Monocytes Absolute 1.3 (H) 0.1 - 1.0 K/uL   Eosinophils Absolute 0.2 0.0 - 0.7 K/uL   Basophils Absolute 0.2 (H) 0.0 - 0.1 K/uL   RBC Morphology POLYCHROMASIA PRESENT    Smear Review LARGE PLATELETS PRESENT   Comprehensive metabolic panel     Status: Abnormal   Collection Time: 05/04/14  6:50 AM  Result Value Ref Range   Sodium 137 135 - 145 mmol/L   Potassium 3.9 3.5 - 5.1 mmol/L   Chloride 110 96 - 112 mmol/L   CO2 20 19 - 32 mmol/L   Glucose, Bld 99 70 - 99 mg/dL   BUN 13 6 - 23 mg/dL   Creatinine, Ser 0.68 0.50 - 1.35 mg/dL    Comment: REPEATED TO VERIFY   Calcium 9.2 8.4 - 10.5 mg/dL   Total Protein 7.8 6.0 - 8.3 g/dL   Albumin 4.3 3.5 - 5.2 g/dL   AST 39 (H) 0 - 37 U/L   ALT 24 0 - 53 U/L   Alkaline Phosphatase 84 39 - 117 U/L    Total Bilirubin 4.9 (H) 0.3 - 1.2 mg/dL   GFR calc non Af Amer >90 >90 mL/min   GFR calc Af Amer >90 >90 mL/min    Comment: (NOTE) The eGFR has been calculated using the CKD EPI equation. This calculation has not been validated in all clinical situations. eGFR's persistently <90 mL/min signify possible Chronic Kidney Disease.    Anion gap 7 5 - 15  Reticulocytes     Status: Abnormal   Collection Time: 05/04/14  6:50 AM  Result Value Ref Range   Retic Ct Pct 10.6 (H) 0.4 - 3.1 %   RBC. 2.30 (L) 4.22 - 5.81 MIL/uL   Retic Count, Manual 243.8 (H) 19.0 - 186.0 K/uL   No results found.  Review of Systems  Constitutional: Negative.   HENT: Negative.   Eyes: Negative.   Respiratory: Negative.   Cardiovascular: Negative.   Gastrointestinal: Negative.   Genitourinary: Negative.   Musculoskeletal: Positive for myalgias and back pain. Negative for joint pain and falls.  Skin: Negative.   Neurological: Negative.   Endo/Heme/Allergies: Negative.   Psychiatric/Behavioral: Positive for depression.    Blood pressure 130/85, pulse 94, temperature 98.2 F (36.8 C), temperature source Oral, resp. rate 21, height 6' (1.829 m), weight 74.39 kg (164 lb), SpO2 94 %. Physical Exam  Constitutional: He is oriented to person, place, and time. He appears well-developed and well-nourished.  HENT:  Head: Normocephalic and atraumatic.  Right Ear: External ear normal.  Left Ear: External ear normal.  Mouth/Throat: Oropharynx is clear and moist.  Eyes: Conjunctivae and EOM are normal. Pupils are equal, round, and reactive to light.  Neck: Normal range of motion. Neck supple.  Cardiovascular: Normal rate, regular rhythm, normal heart sounds and intact distal pulses.   Respiratory: Effort normal and breath sounds normal.  GI: Soft. Bowel sounds are normal.  Musculoskeletal: Normal range of motion. He exhibits no edema or tenderness.  Neurological: He is alert and oriented to person, place, and time.  He has normal reflexes.  Skin: Skin is warm and dry.  Psychiatric:  He has a normal mood and affect.     Assessment/Plan A 34 Yo admitted with sickle Cell Painful crisis:  #1 Sickle Cell painful crisis: Patient will be placed on Dilaudid high concentration PCA since he is Opiate tolerant. Will add toradol and IVF. Will monitor his H/H and continue his long acting home medications.  #2 Sickle Cell Anemia: Hemoglobin is at baseline now. Continue to monitor.  #3 HTN: Continue home medications including Diltiazem, Lisinopril and Metoprolol.  #4 Chronic anticoagulation: Continue Xarelto.  #5 Depression: Counseled.  #6 Pulmonary HTN: Continue treatment per cardiology. Place on oxygen in the hospital.  [facility] 05/04/2014, 9:48 AM

## 2014-05-05 LAB — CBC WITH DIFFERENTIAL/PLATELET
BASOS ABS: 0.2 10*3/uL — AB (ref 0.0–0.1)
Basophils Relative: 1 % (ref 0–1)
Eosinophils Absolute: 0.2 10*3/uL (ref 0.0–0.7)
Eosinophils Relative: 1 % (ref 0–5)
HEMATOCRIT: 20.3 % — AB (ref 39.0–52.0)
Hemoglobin: 6.9 g/dL — CL (ref 13.0–17.0)
LYMPHS ABS: 2.8 10*3/uL (ref 0.7–4.0)
LYMPHS PCT: 16 % (ref 12–46)
MCH: 32.2 pg (ref 26.0–34.0)
MCHC: 34 g/dL (ref 30.0–36.0)
MCV: 94.9 fL (ref 78.0–100.0)
MONO ABS: 2.1 10*3/uL — AB (ref 0.1–1.0)
Monocytes Relative: 12 % (ref 3–12)
NEUTROS PCT: 70 % (ref 43–77)
Neutro Abs: 12.4 10*3/uL — ABNORMAL HIGH (ref 1.7–7.7)
Platelets: 649 10*3/uL — ABNORMAL HIGH (ref 150–400)
RBC: 2.14 MIL/uL — AB (ref 4.22–5.81)
RDW: 24 % — ABNORMAL HIGH (ref 11.5–15.5)
WBC: 17.7 10*3/uL — AB (ref 4.0–10.5)
nRBC: 1 /100 WBC — ABNORMAL HIGH

## 2014-05-05 LAB — RETICULOCYTES
RBC.: 2.14 MIL/uL — ABNORMAL LOW (ref 4.22–5.81)
RETIC CT PCT: 12.2 % — AB (ref 0.4–3.1)
Retic Count, Absolute: 261.1 10*3/uL — ABNORMAL HIGH (ref 19.0–186.0)

## 2014-05-05 LAB — MRSA PCR SCREENING: MRSA by PCR: NEGATIVE

## 2014-05-05 LAB — COMPREHENSIVE METABOLIC PANEL
ALK PHOS: 82 U/L (ref 39–117)
ALT: 21 U/L (ref 0–53)
AST: 40 U/L — AB (ref 0–37)
Albumin: 4.1 g/dL (ref 3.5–5.2)
Anion gap: 6 (ref 5–15)
BILIRUBIN TOTAL: 5.4 mg/dL — AB (ref 0.3–1.2)
BUN: 11 mg/dL (ref 6–23)
CHLORIDE: 111 mmol/L (ref 96–112)
CO2: 21 mmol/L (ref 19–32)
CREATININE: 0.68 mg/dL (ref 0.50–1.35)
Calcium: 8.5 mg/dL (ref 8.4–10.5)
GFR calc Af Amer: >90 mL/min (ref >90)
GFR calc non Af Amer: >90 mL/min (ref >90)
Glucose, Bld: 127 mg/dL — ABNORMAL HIGH (ref 70–99)
Potassium: 3.8 mmol/L (ref 3.5–5.1)
Sodium: 138 mmol/L (ref 135–145)
Total Protein: 7.6 g/dL (ref 6.0–8.3)

## 2014-05-05 MED ORDER — HYDROMORPHONE HCL 2 MG/ML IJ SOLN
2.0000 mg | INTRAMUSCULAR | Status: DC | PRN
  Administered 2014-05-05 – 2014-05-06 (×10): 2 mg via INTRAVENOUS
  Filled 2014-05-05 (×11): qty 1

## 2014-05-05 NOTE — Progress Notes (Signed)
Subjective: A 34 year old gentleman admitted with sickle cell painful crisis. Patient is opiate tolerant and has been on high-dose Dilaudid PCA since yesterday. He is also on Toradol and IV fluids. He has used 34.2 mg with 63 demands and 57 deliveries. Patient still complaining of 8 out of 10 pain in his back and legs. Pain is worse with activities on movement. It is partially relieved by the PCA. Denied any shortness of breath, no cough, no nausea vomiting or diarrhea.  Objective: Vital signs in last 24 hours: Temp:  [97.8 F (36.6 C)-98.8 F (37.1 C)] 97.8 F (36.6 C) (04/04 0522) Pulse Rate:  [83-98] 85 (04/04 0522) Resp:  [16-24] 20 (04/04 0817) BP: (120-149)/(72-88) 133/80 mmHg (04/04 0522) SpO2:  [92 %-100 %] 100 % (04/04 0817) Weight:  [74.481 kg (164 lb 3.2 oz)] 74.481 kg (164 lb 3.2 oz) (04/03 1110) Weight change: 0.091 kg (3.2 oz) Last BM Date: 05/04/14  Intake/Output from previous day: 04/03 0701 - 04/04 0700 In: 1817.3 [P.O.:1077; I.V.:690.3; IV Piggyback:50] Out: 9147 [Urine:1275] Intake/Output this shift: Total I/O In: 237 [P.O.:237] Out: 360 [Urine:360]  General appearance: alert, cooperative, appears stated age and no distress Head: Normocephalic, without obvious abnormality, atraumatic Eyes: conjunctivae/corneas clear. PERRL, EOM's intact. Fundi benign. Neck: no adenopathy, no carotid bruit, no JVD, supple, symmetrical, trachea midline and thyroid not enlarged, symmetric, no tenderness/mass/nodules Back: symmetric, no curvature. ROM normal. No CVA tenderness. Resp: clear to auscultation bilaterally Chest wall: no tenderness Cardio: regular rate and rhythm, S1, S2 normal, no murmur, click, rub or gallop GI: soft, non-tender; bowel sounds normal; no masses,  no organomegaly Extremities: extremities normal, atraumatic, no cyanosis or edema Pulses: 2+ and symmetric Skin: Skin color, texture, turgor normal. No rashes or lesions Neurologic: Grossly normal  Lab  Results:  Recent Labs  05/04/14 1325 05/05/14 0515  WBC 20.1* 17.7*  HGB 6.7* 6.9*  HCT 19.7* 20.3*  PLT 612* 649*   BMET  Recent Labs  05/04/14 0650 05/04/14 1325 05/05/14 0515  NA 137  --  138  K 3.9  --  3.8  CL 110  --  111  CO2 20  --  21  GLUCOSE 99  --  127*  BUN 13  --  11  CREATININE 0.68 0.61 0.68  CALCIUM 9.2  --  8.5    Studies/Results: No results found.  Medications: I have reviewed the patient's current medications.  Assessment/Plan: 34 year old gentleman admitted with sickle cell painful crisis.  #1 sickle cell painful crisis: Patient is getting partial relief but not adequate relief. I will add physician assisted dosing of 2 mg every 3 hours of the Dilaudid. Continue with Toradol and IV fluids. He is also on his long-acting MS Contin 30 mg twice a day. We will reevaluate his pain in the morning.  #2 sickle cell anemia: Patient's hemoglobin is stable. No evidence of acute hemolysis.  #3 hypertension: Blood pressure is well controlled on his home regimen. No change in medications.  #4 pulmonary hypertension: Patient is stable. Continue with oxygen while in the hospital.  #5 chronic anticoagulation: Due to history of pulmonary embolism. Continue Xarelto.  LOS: 1 day   Sherly Brodbeck,[name] 05/05/2014, 10:43 AM

## 2014-05-06 LAB — COMPREHENSIVE METABOLIC PANEL
ALT: 25 U/L (ref 0–53)
AST: 45 U/L — AB (ref 0–37)
Albumin: 4.3 g/dL (ref 3.5–5.2)
Alkaline Phosphatase: 80 U/L (ref 39–117)
Anion gap: 7 (ref 5–15)
BUN: 7 mg/dL (ref 6–23)
CO2: 21 mmol/L (ref 19–32)
Calcium: 8.6 mg/dL (ref 8.4–10.5)
Chloride: 106 mmol/L (ref 96–112)
Creatinine, Ser: 0.51 mg/dL (ref 0.50–1.35)
GFR calc non Af Amer: >90 mL/min (ref >90)
Glucose, Bld: 120 mg/dL — ABNORMAL HIGH (ref 70–99)
Potassium: 4.1 mmol/L (ref 3.5–5.1)
SODIUM: 134 mmol/L — AB (ref 135–145)
Total Bilirubin: 5.5 mg/dL — ABNORMAL HIGH (ref 0.3–1.2)
Total Protein: 8 g/dL (ref 6.0–8.3)

## 2014-05-06 LAB — CBC WITH DIFFERENTIAL/PLATELET
Basophils Absolute: 0 10*3/uL (ref 0.0–0.1)
Basophils Relative: 0 % (ref 0–1)
EOS PCT: 2 % (ref 0–5)
Eosinophils Absolute: 0.3 10*3/uL (ref 0.0–0.7)
HEMATOCRIT: 20.7 % — AB (ref 39.0–52.0)
Hemoglobin: 7 g/dL — ABNORMAL LOW (ref 13.0–17.0)
Lymphocytes Relative: 22 % (ref 12–46)
Lymphs Abs: 3.2 10*3/uL (ref 0.7–4.0)
MCH: 32.3 pg (ref 26.0–34.0)
MCHC: 33.8 g/dL (ref 30.0–36.0)
MCV: 95.4 fL (ref 78.0–100.0)
MONOS PCT: 14 % — AB (ref 3–12)
Monocytes Absolute: 2.1 10*3/uL — ABNORMAL HIGH (ref 0.1–1.0)
NRBC: 1 /100{WBCs} — AB
Neutro Abs: 9.1 10*3/uL — ABNORMAL HIGH (ref 1.7–7.7)
Neutrophils Relative %: 62 % (ref 43–77)
Platelets: 619 10*3/uL — ABNORMAL HIGH (ref 150–400)
RBC: 2.17 MIL/uL — AB (ref 4.22–5.81)
RDW: 23.3 % — ABNORMAL HIGH (ref 11.5–15.5)
WBC: 14.7 10*3/uL — AB (ref 4.0–10.5)

## 2014-05-06 MED ORDER — HYDROMORPHONE HCL 1 MG/ML IJ SOLN: 1.0000 mg | INTRAMUSCULAR | Status: DC | PRN

## 2014-05-06 MED ORDER — HYDROMORPHONE HCL 1 MG/ML IJ SOLN
1.0000 mg | INTRAMUSCULAR | Status: AC | PRN
  Administered 2014-05-06 – 2014-05-07 (×2): 1 mg via INTRAVENOUS

## 2014-05-06 NOTE — Progress Notes (Signed)
SICKLE CELL SERVICE PROGRESS NOTE  [name] PJA:[id number] DOB: May 24, 1980 DOA: 05/04/2014 PCP: Ticara Waner A., MD  Assessment/Plan: Active Problems:   Uses marijuana   Chronic pain syndrome   Sickle cell disease with crisis   Sickle cell anemia with crisis  1. Hb SS with crisis: Pt has used only 3 mg with 5/5:demands/deliveries in the last 3 hours. The pump was changed so data from the last 24 hours lost. He has also required 12 clinician assisted doses in the last 3 hours. Will continue PCA at current dose and decrease frequency of clinician assisted doses. Continue MS Contin,Toradol and decrease IVF. Anticipate discharge home tomorrow 2. Leukocytosis: No evidence of infection. Likely related to crisis.  3. Thrombocytosis: Likely reactive and secondary to bone marrow turnover. 4. Anemia: Hb at baseline 5. Chronic pain: Continue MS Contin 6. Medication non-compliance: Pt adamant that he is taking Hydrea as prescribed. However indices not consistent with compliance.  Code Status: Full Code Family Communication: N/A Disposition Plan: Not yet ready for discharge  [place].  Pager [phone]. If 7PM-7AM, please contact night-coverage.  05/06/2014, 4:32 PM  LOS: 2 days    Consultants:  None  Procedures:  None  Antibiotics:  None  HPI/Subjective: Pt reports pain 6/10 localized to ribs, back and legs.   Objective: Filed Vitals:   05/06/14 0837 05/06/14 0950 05/06/14 1216 05/06/14 1339  BP:  134/84  134/81  Pulse:  86  79  Temp:  98.5 F (36.9 C)  98.7 F (37.1 C)  TempSrc:  Oral  Oral  Resp:  16 15 15   Height:      Weight:      SpO2: 94% 94% 100% 95%   Weight change:   Intake/Output Summary (Last 24 hours) at 05/06/14 1632 Last data filed at 05/06/14 1541  Gross per 24 hour  Intake 5946.5 ml  Output   4025 ml  Net 1921.5 ml    General: Alert, awake, oriented x3, in no acute distress.  HEENT: [place]/AT PEERL, EOMI Neck: Trachea midline,   no masses, no thyromegal,y no JVD, no carotid bruit OROPHARYNX:  Moist, No exudate/ erythema/lesions.  Heart: Regular rate and rhythm, without murmurs, rubs, gallops, PMI non-displaced, no heaves or thrills on palpation.  Lungs: Clear to auscultation, no wheezing or rhonchi noted. No increased vocal fremitus resonant to percussion  Abdomen: Soft, nontender, nondistended, positive bowel sounds, no masses no hepatosplenomegaly noted.  Neuro: No focal neurological deficits noted cranial nerves II through XII grossly intact. DTRs 2+ bilaterally upper and lower extremities. Strength at baseline in bilateral upper and lower extremities. Musculoskeletal: No warm swelling or erythema around joints, no spinal tenderness noted. Psychiatric: Patient alert and oriented x3, good insight and cognition, good recent to remote recall. Lymph node survey: No cervical axillary or inguinal lymphadenopathy noted.   Data Reviewed: Basic Metabolic Panel:  Recent Labs Lab 05/03/14 0832 05/04/14 0650 05/04/14 1325 05/05/14 0515 05/06/14 0510  NA 136 137  --  138 134*  K 3.6 3.9  --  3.8 4.1  CL 108 110  --  111 106  CO2 20 20  --  21 21  GLUCOSE 123* 99  --  127* 120*  BUN 17 13  --  11 7  CREATININE 1.02 0.68 0.61 0.68 0.51  CALCIUM 7.9* 9.2  --  8.5 8.6  MG  --   --  1.7  --   --    Liver Function Tests:  Recent Labs Lab 05/03/14 0240 05/04/14  3267 05/05/14 0515 05/06/14 0510  AST 36 39* 40* 45*  ALT 24 24 21 25   ALKPHOS 93 84 82 80  BILITOT 4.6* 4.9* 5.4* 5.5*  PROT 7.0 7.8 7.6 8.0  ALBUMIN 3.8 4.3 4.1 4.3   No results for input(s): LIPASE, AMYLASE in the last 168 hours. No results for input(s): AMMONIA in the last 168 hours. CBC:  Recent Labs Lab 05/03/14 0832 05/04/14 0650 05/04/14 1325 05/05/14 0515 05/06/14 0510  WBC 14.4* 16.8* 20.1* 17.7* 14.7*  NEUTROABS 9.8* 13.6*  --  12.4* 9.1*  HGB 6.7* 7.3* 6.7* 6.9* 7.0*  HCT 20.1* 21.8* 19.7* 20.3* 20.7*  MCV 95.3 94.8 94.7 94.9  95.4  PLT 682* 682* 612* 649* 619*   Cardiac Enzymes: No results for input(s): CKTOTAL, CKMB, CKMBINDEX, TROPONINI in the last 168 hours. BNP (last 3 results) No results for input(s): BNP in the last 8760 hours.  ProBNP (last 3 results)  Recent Labs  01/01/14 0909  PROBNP 5790.0*    CBG: No results for input(s): GLUCAP in the last 168 hours.  Recent Results (from the past 240 hour(s))  MRSA PCR Screening     Status: None   Collection Time: 05/05/14 10:00 AM  Result Value Ref Range Status   MRSA by PCR NEGATIVE NEGATIVE Final    Comment:        The GeneXpert MRSA Assay (FDA approved for NASAL specimens only), is one component of a comprehensive MRSA colonization surveillance program. It is not intended to diagnose MRSA infection nor to guide or monitor treatment for MRSA infections.      Studies: Dg Chest 2 View  04/29/2014   CLINICAL DATA:  Sickle cell crisis.  Pain.  EXAM: CHEST  2 VIEW  COMPARISON:  04/24/2014 .  FINDINGS: Power Port catheter noted with lead tip projected over the cavoatrial junction. Mediastinum and hilar structures normal. Cardiomegaly. Stable bilateral pleural parenchymal scarring, no change. No acute infiltrate. No pleural effusion or pneumothorax. Surgical clips right upper quadrant. No acute bony abnormality.  IMPRESSION: 1. Stable changes of pleural parenchymal scarring. No acute infiltrate. 2. Stable cardiomegaly. 3. Power port catheter in stable position.   Electronically Signed   By: Marcello Moores  Register   On: 04/29/2014 08:29   Dg Chest 2 View  04/24/2014   CLINICAL DATA:  Sickle cell crisis with chest pain. Gradual onset over 2 days. Worse this morning.  EXAM: CHEST  2 VIEW  COMPARISON:  04/06/2014  FINDINGS: Left chest port remains in place. Cardiomegaly and prominent hila are unchanged. Pulmonary vasculature is normal, no pulmonary edema. Scarring predominantly in the lower lobes, unchanged from prior exam. No consolidation. No pleural effusion  or pneumothorax. No acute osseous abnormalities are seen.  IMPRESSION: Stable chronic change, no acute pulmonary process.   Electronically Signed   By: Jeb Levering M.D.   On: 04/24/2014 06:39    Scheduled Meds: . cholecalciferol  2,000 Units Oral Daily  . diltiazem  120 mg Oral Daily  . folic acid  1 mg Oral q morning - 10a  . HYDROmorphone PCA 2 mg/mL   Intravenous 6 times per day  . hydroxyurea  1,500 mg Oral Daily  . ketorolac  30 mg Intravenous 4 times per day  . lisinopril  10 mg Oral Daily  . metoprolol tartrate  25 mg Oral Daily  . morphine  30 mg Oral Q12H  . potassium chloride SA  20 mEq Oral q morning - 10a  . rivaroxaban  20 mg Oral  Q supper  . senna-docusate  1 tablet Oral BID   Continuous Infusions: . dextrose 5 % and 0.45% NaCl 125 mL/hr at 05/06/14 0732    Time spent 40 minutes.

## 2014-05-07 LAB — COMPREHENSIVE METABOLIC PANEL
ALT: 25 U/L (ref 0–53)
AST: 43 U/L — ABNORMAL HIGH (ref 0–37)
Albumin: 3.9 g/dL (ref 3.5–5.2)
Alkaline Phosphatase: 85 U/L (ref 39–117)
Anion gap: 6 (ref 5–15)
BILIRUBIN TOTAL: 5 mg/dL — AB (ref 0.3–1.2)
BUN: 11 mg/dL (ref 6–23)
CO2: 21 mmol/L (ref 19–32)
Calcium: 8.2 mg/dL — ABNORMAL LOW (ref 8.4–10.5)
Chloride: 107 mmol/L (ref 96–112)
Creatinine, Ser: 0.88 mg/dL (ref 0.50–1.35)
GFR calc Af Amer: >90 mL/min (ref >90)
GLUCOSE: 131 mg/dL — AB (ref 70–99)
POTASSIUM: 4.2 mmol/L (ref 3.5–5.1)
SODIUM: 134 mmol/L — AB (ref 135–145)
Total Protein: 7.2 g/dL (ref 6.0–8.3)

## 2014-05-07 LAB — LACTATE DEHYDROGENASE: LDH: 560 U/L — AB (ref 94–250)

## 2014-05-07 MED ORDER — HEPARIN SOD (PORK) LOCK FLUSH 100 UNIT/ML IV SOLN
500.0000 [IU] | INTRAVENOUS | Status: DC | PRN
  Filled 2014-05-07: qty 5

## 2014-05-07 NOTE — Discharge Summary (Signed)
[name] MRN: [id number] DOB/AGE: [birth year] 34 y.o.  Admit date: 05/04/2014 Discharge date: 05/07/2014  Primary Care Physician:  [name],[name], MD   Discharge Diagnoses:   Patient Active Problem List   Diagnosis Date Noted  . Pulmonary HTN 06/18/2013    Priority: High  . Functional asplenia     Priority: High  . 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 Avascular necrosis     Priority: Medium  . Sickle cell anemia with crisis 04/24/2014  . Sickle cell disease with crisis 04/03/2014  . PAH (pulmonary artery hypertension) 03/18/2014  . 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 Pulmonary embolism 03/18/2014  . Sickle cell pain crisis 03/14/2014  . Hypoxia 03/14/2014  . Demand ischemia of myocardium 03/12/2014  . Sickle cell crisis 03/11/2014  . Atrial fibrillation with RVR 03/11/2014  . DOE (dyspnea on exertion) 02/13/2014  . 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 Pulmonary embolus 01/01/2014  . Paralytic strabismus, external ophthalmoplegia   . Chronic pain syndrome 12/12/2013  . Anemia 09/22/2013  . Chronic anticoagulation 08/22/2013  . Essential hypertension 08/22/2013  . Thrombocytosis 07/01/2013  . Hypokalemia 04/22/2013  . Vitamin D deficiency 02/13/2013  . Uses marijuana 01/13/2013  . Leukocytosis 08/30/2012  . Rib pain 08/06/2012  . Embolism, pulmonary with infarction 07/09/2012  . Hx of pulmonary embolus 06/29/2012  . Hemochromatosis 12/14/2011    DISCHARGE MEDICATION:   Medication List    TAKE these medications        diltiazem 120 MG 24 hr capsule  Commonly known as:  CARDIZEM CD  Take 1 capsule (120 mg total) by mouth daily.     folic acid 1 MG tablet  Commonly known as:  FOLVITE  Take 1 tablet (1 mg total) by mouth every morning.     HYDROmorphone 4 MG tablet  Commonly known as:  DILAUDID  Take 1 tablet (4 mg total) by mouth every 4 (four) hours as needed for severe pain.     hydroxyurea  500 MG capsule  Commonly known as:  HYDREA  Take 3 capsules (1,500 mg total) by mouth daily. May take with food to minimize GI side effects.     lisinopril 10 MG tablet  Commonly known as:  PRINIVIL,ZESTRIL  Take 1 tablet (10 mg total) by mouth daily.     metoprolol tartrate 25 MG tablet  Commonly known as:  LOPRESSOR  Take 1 tablet (25 mg total) by mouth daily.     morphine 30 MG 12 hr tablet  Commonly known as:  MS CONTIN  Take 1 tablet (30 mg total) by mouth every 12 (twelve) hours.     potassium chloride SA 20 MEQ tablet  Commonly known as:  K-DUR,KLOR-CON  Take 1 tablet (20 mEq total) by mouth every morning.     rivaroxaban 20 MG Tabs tablet  Commonly known as:  XARELTO  Take 1 tablet (20 mg total) by mouth every morning.     Vitamin D 2000 UNITS tablet  Take 1 tablet (2,000 Units total) by mouth daily.     zolpidem 10 MG tablet  Commonly known as:  AMBIEN  Take 1 tablet (10 mg total) by mouth at bedtime as needed for sleep.          Consults:     SIGNIFICANT DIAGNOSTIC STUDIES:  Dg Chest 2 View  04/29/2014   CLINICAL DATA:  Sickle cell crisis.  Pain.  EXAM: CHEST  2 VIEW  COMPARISON:  04/24/2014 .  FINDINGS: Power Port catheter noted with lead tip projected over the cavoatrial junction. Mediastinum and hilar structures normal. Cardiomegaly. Stable bilateral pleural parenchymal scarring, no change. No acute infiltrate. No pleural effusion or pneumothorax. Surgical clips right upper quadrant. No acute bony abnormality.  IMPRESSION: 1. Stable changes of pleural parenchymal scarring. No acute infiltrate. 2. Stable cardiomegaly. 3. Power port catheter in stable position.   Electronically Signed   By: Marcello Moores  Register   On: 04/29/2014 08:29   Dg Chest 2 View  04/24/2014   CLINICAL DATA:  Sickle cell crisis with chest pain. Gradual onset over 2 days. Worse this morning.  EXAM: CHEST  2 VIEW  COMPARISON:  04/06/2014  FINDINGS: Left chest port remains in place. Cardiomegaly  and prominent hila are unchanged. Pulmonary vasculature is normal, no pulmonary edema. Scarring predominantly in the lower lobes, unchanged from prior exam. No consolidation. No pleural effusion or pneumothorax. No acute osseous abnormalities are seen.  IMPRESSION: Stable chronic change, no acute pulmonary process.   Electronically Signed   By: Jeb Levering M.D.   On: 04/24/2014 06:39     Recent Results (from the past 240 hour(s))  MRSA PCR Screening     Status: None   Collection Time: 05/05/14 10:00 AM  Result Value Ref Range Status   MRSA by PCR NEGATIVE NEGATIVE Final    Comment:        The GeneXpert MRSA Assay (FDA approved for NASAL specimens only), is one component of a comprehensive MRSA colonization surveillance program. It is not intended to diagnose MRSA infection nor to guide or monitor treatment for MRSA infections.    BRIEF ADMITTING H & P: A 34 yo with known sickle cell disease on chronic Narcotics daily who is Opiate Tolerant here with pain at 9/10 in his lower back and legs. Has been taking his home medications but no relief. Pain felt like his typical sickle cell pain. No SOB, no Cough, No NVD. Pain is worsened by movement and activities and mildly relieved by his home medications. He was in the ER yesterday and was treated for 8 hours and left but had to come back today because pain has persisted and not relieved.    Hospital Course:  Present on Admission:  . Sickle cell disease with crisis: [name] is an opiate tolerant patient who is usually treated at the [facility]. He was admitted with Sickle Cell Crisis at a time when the [facility] was unavailable. He was managed with Dilaudid via PCA, clinician assisited doses, Toradol and IVF. His pain decreased and he is being discharged home on his pre-hospitalization analgesics. He has an appointment to follow up in the clinic tomorrow.   . Chronic pain syndrome: On MS Contin and Hydrea.  Disposition and  Follow-up:  Discharged in good condition to follow up in clinic tomorrow.      Discharge Instructions    Activity as tolerated - No restrictions    Complete by:  As directed      Diet general    Complete by:  As directed            DISCHARGE EXAM:  General: Alert, awake, oriented x3, in mild distress.  Vital Signs: BP 131/77, HR 83, T 98.7 F (37.1 C), temperature source Oral, RR16, height 6' (1.829 m), weight 167 lb 3.2 oz (75.841 kg), SpO2 97 %. HEENT: [place]/AT PEERL, EOMI, mild icterus Neck: Trachea midline, no masses, no thyromegal,y no JVD, no carotid bruit  OROPHARYNX: Moist, No exudate/ erythema/lesions.  Heart: Regular rate and rhythm, without murmurs, rubs, gallops or S3. PMI non-displaced. Exam reveals no decreased pulses. Pulmonary/Chest: Normal effort. Breath sounds normal. No. Apnea. Clear to auscultation,no stridor,  no wheezing and no rhonchi noted. No respiratory distress and no tenderness noted. Abdomen: Soft, nontender, nondistended, normal bowel sounds, no masses no hepatosplenomegaly noted. No fluid wave and no ascites. There is no guarding or rebound. Neuro: Alert and oriented to person, place and time. Normal motor skills, Displays no atrophy or tremors and exhibits normal muscle tone.  No focal neurological deficits noted cranial nerves II through XII grossly intact. No sensory deficit noted. Strength at baseline in bilateral upper and lower extremities. Gait normal. Musculoskeletal: No warm swelling or erythema around joints, no spinal tenderness noted. Psychiatric: Patient alert and oriented x3, good insight and cognition, good recent to remote recall. Lymph node survey: No cervical axillary or inguinal lymphadenopathy noted. Skin: Skin is warm and dry. No bruising, no ecchymosis and no rash noted. Pt is not diaphoretic. No erythema. No pallor Psychiatric: Mood, memory, affect and judgement normal     Recent Labs  05/04/14 1325  05/06/14 0510 05/07/14 0530   NA  --   < > 134* 134*  K  --   < > 4.1 4.2  CL  --   < > 106 107  CO2  --   < > 21 21  GLUCOSE  --   < > 120* 131*  BUN  --   < > 7 11  CREATININE 0.61  < > 0.51 0.88  CALCIUM  --   < > 8.6 8.2*  MG 1.7  --   --   --   < > = values in this interval not displayed.  Recent Labs  05/06/14 0510 05/07/14 0530  AST 45* 43*  ALT 25 25  ALKPHOS 80 85  BILITOT 5.5* 5.0*  PROT 8.0 7.2  ALBUMIN 4.3 3.9   No results for input(s): LIPASE, AMYLASE in the last 72 hours.  Recent Labs  05/05/14 0515 05/06/14 0510  WBC 17.7* 14.7*  NEUTROABS 12.4* 9.1*  HGB 6.9* 7.0*  HCT 20.3* 20.7*  MCV 94.9 95.4  PLT 649* 619*     Total time spent including face to face and decision making was greater than 30 minutes  Signed: [name],[name] 05/07/2014, 11:43 AM

## 2014-05-07 NOTE — Progress Notes (Signed)
I wasted the remaining 22 mg of patient's high concentrated dilaudid PCA down the sink with Laural Benes RN post his discharge.

## 2014-05-07 NOTE — Progress Notes (Signed)
Patient was stable at time of discharge. I reviewed discharge education with patient. He verbalized understanding and had no further questions. Patient's port was flushed with saline & heparin and deaccessed.

## 2014-05-08 ENCOUNTER — Ambulatory Visit: Payer: Self-pay | Admitting: Family Medicine

## 2014-05-14 ENCOUNTER — Non-Acute Institutional Stay (HOSPITAL_COMMUNITY)
Admission: AD | Admit: 2014-05-14 | Discharge: 2014-05-14 | Disposition: A | Discharge status: Home / Self Care | Payer: Medicare Other | Attending: Internal Medicine | Admitting: Internal Medicine

## 2014-05-14 ENCOUNTER — Telehealth (HOSPITAL_COMMUNITY): Payer: Self-pay | Admitting: Internal Medicine

## 2014-05-14 ENCOUNTER — Encounter (HOSPITAL_COMMUNITY): Payer: Self-pay

## 2014-05-14 DIAGNOSIS — D5700 Hb-SS disease with crisis, unspecified: Secondary | ICD-10-CM | POA: Diagnosis present

## 2014-05-14 DIAGNOSIS — F1210 Cannabis abuse, uncomplicated: Secondary | ICD-10-CM | POA: Diagnosis not present

## 2014-05-14 DIAGNOSIS — I10 Essential (primary) hypertension: Secondary | ICD-10-CM | POA: Insufficient documentation

## 2014-05-14 DIAGNOSIS — Z79899 Other long term (current) drug therapy: Secondary | ICD-10-CM | POA: Diagnosis not present

## 2014-05-14 DIAGNOSIS — Z87891 Personal history of nicotine dependence: Secondary | ICD-10-CM | POA: Insufficient documentation

## 2014-05-14 LAB — CBC WITH DIFFERENTIAL/PLATELET
BASOS ABS: 0.2 10*3/uL — AB (ref 0.0–0.1)
Basophils Relative: 1 % (ref 0–1)
EOS ABS: 0.4 10*3/uL (ref 0.0–0.7)
Eosinophils Relative: 2 % (ref 0–5)
HCT: 21.8 % — ABNORMAL LOW (ref 39.0–52.0)
Hemoglobin: 7.4 g/dL — ABNORMAL LOW (ref 13.0–17.0)
LYMPHS ABS: 2.8 10*3/uL (ref 0.7–4.0)
Lymphocytes Relative: 16 % (ref 12–46)
MCH: 32.9 pg (ref 26.0–34.0)
MCHC: 33.9 g/dL (ref 30.0–36.0)
MCV: 96.9 fL (ref 78.0–100.0)
MONOS PCT: 11 % (ref 3–12)
Monocytes Absolute: 1.9 10*3/uL — ABNORMAL HIGH (ref 0.1–1.0)
Neutro Abs: 12.2 10*3/uL — ABNORMAL HIGH (ref 1.7–7.7)
Neutrophils Relative %: 70 % (ref 43–77)
PLATELETS: 632 10*3/uL — AB (ref 150–400)
RBC: 2.25 MIL/uL — ABNORMAL LOW (ref 4.22–5.81)
RDW: 24.1 % — AB (ref 11.5–15.5)
WBC: 17.5 10*3/uL — ABNORMAL HIGH (ref 4.0–10.5)

## 2014-05-14 LAB — COMPREHENSIVE METABOLIC PANEL
ALT: 19 U/L (ref 0–53)
AST: 34 U/L (ref 0–37)
Albumin: 4.1 g/dL (ref 3.5–5.2)
Alkaline Phosphatase: 86 U/L (ref 39–117)
Anion gap: 6 (ref 5–15)
BUN: 14 mg/dL (ref 6–23)
CALCIUM: 8.5 mg/dL (ref 8.4–10.5)
CO2: 21 mmol/L (ref 19–32)
Chloride: 110 mmol/L (ref 96–112)
Creatinine, Ser: 0.64 mg/dL (ref 0.50–1.35)
GFR calc Af Amer: >90 mL/min (ref >90)
GFR calc non Af Amer: >90 mL/min (ref >90)
Glucose, Bld: 116 mg/dL — ABNORMAL HIGH (ref 70–99)
POTASSIUM: 3.9 mmol/L (ref 3.5–5.1)
SODIUM: 137 mmol/L (ref 135–145)
TOTAL PROTEIN: 7.4 g/dL (ref 6.0–8.3)
Total Bilirubin: 4.2 mg/dL — ABNORMAL HIGH (ref 0.3–1.2)

## 2014-05-14 LAB — RETICULOCYTES
RBC.: 2.25 MIL/uL — AB (ref 4.22–5.81)
Retic Count, Absolute: 204.8 10*3/uL — ABNORMAL HIGH (ref 19.0–186.0)
Retic Ct Pct: 9.1 % — ABNORMAL HIGH (ref 0.4–3.1)

## 2014-05-14 MED ORDER — SODIUM CHLORIDE 0.9 % IJ SOLN: 9.0000 mL | INTRAMUSCULAR | Status: DC | PRN

## 2014-05-14 MED ORDER — HYDROMORPHONE 2 MG/ML HIGH CONCENTRATION IV PCA SOLN
INTRAVENOUS | Status: DC
  Administered 2014-05-14: 11:00:00 via INTRAVENOUS
  Administered 2014-05-14: 9.8 mg via INTRAVENOUS
  Filled 2014-05-14 (×2): qty 25

## 2014-05-14 MED ORDER — SODIUM CHLORIDE 0.9 % IJ SOLN: 10.0000 mL | INTRAMUSCULAR | Status: DC | PRN

## 2014-05-14 MED ORDER — DIPHENHYDRAMINE HCL 25 MG PO CAPS
25.0000 mg | ORAL_CAPSULE | Freq: Four times a day (QID) | ORAL | Status: DC | PRN
  Administered 2014-05-14: 25 mg via ORAL
  Filled 2014-05-14: qty 1

## 2014-05-14 MED ORDER — HYDROMORPHONE HCL 4 MG PO TABS
4.0000 mg | ORAL_TABLET | Freq: Once | ORAL | Status: AC
  Administered 2014-05-14: 4 mg via ORAL
  Filled 2014-05-14: qty 1

## 2014-05-14 MED ORDER — HYDROMORPHONE HCL 2 MG/ML IJ SOLN
2.5000 mg | Freq: Once | INTRAMUSCULAR | Status: AC
  Administered 2014-05-14: 2.5 mg via INTRAVENOUS
  Filled 2014-05-14: qty 2

## 2014-05-14 MED ORDER — HEPARIN SOD (PORK) LOCK FLUSH 100 UNIT/ML IV SOLN
500.0000 [IU] | INTRAVENOUS | Status: AC | PRN
  Administered 2014-05-14: 500 [IU]

## 2014-05-14 MED ORDER — SODIUM CHLORIDE 0.9 % IV SOLN
25.0000 mg | INTRAVENOUS | Status: DC | PRN
  Filled 2014-05-14: qty 0.5

## 2014-05-14 MED ORDER — SODIUM CHLORIDE 0.9 % IJ SOLN
10.0000 mL | INTRAMUSCULAR | Status: AC | PRN
  Administered 2014-05-14: 10 mL

## 2014-05-14 MED ORDER — FOLIC ACID 1 MG PO TABS
1.0000 mg | ORAL_TABLET | Freq: Every day | ORAL | Status: DC
  Administered 2014-05-14: 1 mg via ORAL
  Filled 2014-05-14: qty 1

## 2014-05-14 MED ORDER — HYDROMORPHONE HCL 2 MG/ML IJ SOLN
3.0000 mg | Freq: Once | INTRAMUSCULAR | Status: AC
  Administered 2014-05-14: 3 mg via INTRAVENOUS
  Filled 2014-05-14: qty 2

## 2014-05-14 MED ORDER — DEXTROSE-NACL 5-0.45 % IV SOLN
INTRAVENOUS | Status: DC
Start: 2014-05-14 — End: 2014-05-14
  Administered 2014-05-14: 09:00:00 via INTRAVENOUS

## 2014-05-14 MED ORDER — ONDANSETRON HCL 4 MG/2ML IJ SOLN: 4.0000 mg | Freq: Four times a day (QID) | INTRAMUSCULAR | Status: DC | PRN

## 2014-05-14 MED ORDER — NALOXONE HCL 0.4 MG/ML IJ SOLN: 0.4000 mg | INTRAMUSCULAR | Status: DC | PRN

## 2014-05-14 MED ORDER — HYDROMORPHONE HCL 2 MG/ML IJ SOLN
2.0000 mg | Freq: Once | INTRAMUSCULAR | Status: AC
  Administered 2014-05-14: 2 mg via INTRAVENOUS
  Filled 2014-05-14: qty 1

## 2014-05-14 MED ORDER — KETOROLAC TROMETHAMINE 30 MG/ML IJ SOLN
30.0000 mg | Freq: Four times a day (QID) | INTRAMUSCULAR | Status: DC
  Administered 2014-05-14: 30 mg via INTRAVENOUS
  Filled 2014-05-14: qty 1

## 2014-05-14 MED ORDER — HEPARIN SOD (PORK) LOCK FLUSH 100 UNIT/ML IV SOLN
500.0000 [IU] | INTRAVENOUS | Status: DC | PRN
  Filled 2014-05-14: qty 5

## 2014-05-14 NOTE — H&P (Signed)
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 He reports that the pain came on yesterday and was likely triggered by fatigue after he celebrated his birthday last weekend. Pt also had ETOH socially last weekend which is known to be a trigger of his crises. He attempted to control his pain with oral analgesics but was unsuccessful. He describes pain as 8/10 and localized to his chest wall bilaterally. The pain is  throbbing in nature and occassionally sharp. It is non-radiating and not associated with any other symptoms. He denies any associated symptoms.   Review of Systems:  Constitutional: No weight loss, night sweats, Fevers, chills, fatigue.  HEENT: No headaches, dizziness, seizures, vision changes, difficulty swallowing,Tooth/dental problems,Sore throat, No sneezing, itching, ear ache, nasal congestion, post nasal drip,  Cardio-vascular: No chest pain, Orthopnea, PND, swelling in lower extremities, anasarca, dizziness, palpitations  GI: No heartburn, indigestion, abdominal pain, nausea, vomiting, diarrhea, change in bowel habits, loss of appetite  Resp: No shortness of breath with exertion or at rest. No excess mucus, no productive cough, No non-productive cough, No coughing up of blood.No change in color of mucus.No wheezing.No chest wall deformity  Skin: no rash or lesions.  GU: no dysuria, change in color of urine, no urgency or frequency. No flank pain.  Psych: No change in mood or affect. No depression or anxiety. No memory loss.    Past Medical History  Diagnosis Date  . Sickle cell anemia   . Blood transfusion   . Acute embolism and thrombosis of right internal jugular vein   . Hypokalemia   . Mood disorder   . History of pulmonary  embolus (PE)   . Avascular necrosis   . Leukocytosis     Chronic  . Thrombocytosis     Chronic  . Hypertension   . History of Clostridium difficile infection   . Uses marijuana   . Chronic anticoagulation   . Functional asplenia   . Former smoker   . Second hand tobacco smoke exposure   . Alcohol consumption of one to four drinks per day   . Noncompliance with medication regimen   . Sickle-cell crisis with associated acute chest syndrome 05/13/2013  . Acute chest syndrome 06/18/2013  . Demand ischemia 01/02/2014   Past Surgical History  Procedure Laterality Date  . Right hip replacement      08/2006  . Cholecystectomy      01/2008  . Porta cath placement    . Porta cath removal    . Umbilical hernia repair      01/2008  . Excision of left periauricular cyst      10/2009  . Excision of right ear lobe cyst with primary closur      11/2007  . Portacath placement  01/05/2012    Procedure: INSERTION PORT-A-CATH;  Surgeon: Odis Hollingshead, MD;  Location: [facility];  Service: General;  Laterality: N/A;  ultrasound guiced port a cath insertion with fluoroscopy   Social History:  reports that he quit smoking about 3 years ago. He has never used smokeless tobacco. He reports that he uses illicit drugs (Marijuana) about twice per week. He reports that he does not drink alcohol.  No Known Allergies  Family History  Problem  Relation Age of Onset  . Sickle cell trait Mother   . Depression Mother   . Diabetes Mother   . Sickle cell trait Father   . Sickle cell trait Brother     Prior to Admission medications   Medication Sig Start Date End Date Taking? Authorizing Provider  folic acid (FOLVITE) 1 MG tablet Take 1 tablet (1 mg total) by mouth every morning. 10/08/13  Yes [name], MD  HYDROmorphone (DILAUDID) 4 MG tablet Take 1 tablet (4 mg total) by mouth every 4 (four) hours as needed for severe pain. 10/24/13  Yes Dorena Dew, FNP  hydroxyurea (HYDREA) 500 MG capsule Take  3 capsules (1,500 mg total) by mouth daily. May take with food to minimize GI side effects. 10/30/13  Yes [name], MD  lisinopril (PRINIVIL,ZESTRIL) 10 MG tablet Take 1 tablet (10 mg total) by mouth daily. 10/24/13  Yes Dorena Dew, FNP  metoprolol tartrate (LOPRESSOR) 25 MG tablet Take 1 tablet (25 mg total) by mouth daily. 08/22/13  Yes [name], MD  morphine (MS CONTIN) 30 MG 12 hr tablet Take 1 tablet (30 mg total) by mouth every 12 (twelve) hours. 11/07/13  Yes Dorena Dew, FNP  potassium chloride SA (K-DUR,KLOR-CON) 20 MEQ tablet Take 1 tablet (20 mEq total) by mouth every morning. 09/25/13  Yes [name], MD  rivaroxaban (XARELTO) 20 MG TABS tablet Take 1 tablet (20 mg total) by mouth every morning. 10/24/13  Yes Dorena Dew, FNP  zolpidem (AMBIEN) 10 MG tablet Take 1 tablet (10 mg total) by mouth at bedtime as needed for sleep. 10/28/13  Yes Dorena Dew, FNP   Physical Exam: Filed Vitals:   05/14/14 0858 05/14/14 1043 05/14/14 1144  BP: 111/74  104/68  Pulse: 93  86  Temp: 98.4 F (36.9 C)    TempSrc: Oral    Resp: 18 26 19   SpO2: 100% 96% 94%   General: Alert, awake, oriented x3, in mild distress.  HEENT: [place]/AT PEERL, EOMI, anicteric Neck: Trachea midline,  no masses, no thyromegal,y no JVD, no carotid bruit OROPHARYNX:  Moist, No exudate/ erythema/lesions.  Heart: Regular rate and rhythm, without murmurs, rubs, gallops, PMI non-displaced, no heaves or thrills on palpation.  Lungs: Clear to auscultation, no wheezing or rhonchi noted.  Abdomen: Soft, nontender, nondistended, positive bowel sounds, no masses no hepatosplenomegaly noted..  Neuro: No focal neurological deficits noted cranial nerves II through XII grossly intact.   Labs on Admission:   Basic Metabolic Panel:  Recent Labs Lab 05/14/14 0907  NA 137  K 3.9  CL 110  CO2 21  GLUCOSE 116*  BUN 14  CREATININE 0.64  CALCIUM 8.5   Liver Function Tests:  Recent  Labs Lab 05/14/14 0907  AST 34  ALT 19  ALKPHOS 86  BILITOT 4.2*  PROT 7.4  ALBUMIN 4.1   CBC:  Recent Labs Lab 05/14/14 0907  WBC 17.5*  NEUTROABS 12.2*  HGB 7.4*  HCT 21.8*  MCV 96.9  PLT 632*    Assessment/Plan: Active Problems: 1. Hb SS with crisis: Pt will be treated with initially treated with weight based rapid re-dosing IVF and Toradol . If pain is above 7/10, then Pt will be transitioned to a weight based Dilaudid PCA . Patient will be re-evaluated for pain in the context of function and relationship to baseline as care progresses.      Time spend: 40 minutes Code Status: Full Code Family Communication: N/A  Disposition Plan: Not yet determined.  Ildefonso Keaney A., MD  Pager [phone]  If 7PM-7AM, please contact night-coverage [email or web address] Password [email or web address] 05/14/2014, 12:12 PM

## 2014-05-14 NOTE — Progress Notes (Signed)
Patient ID: [name], male   DOB: 1980-08-24, 34 y.o.   MRN: [id number] Discharge instructions given to patient along with follow up information, port flushed and de-accessed per protocol.  Questions answered.  Patient states pain has improved to 4/10 on pain scale.

## 2014-05-14 NOTE — Discharge Summary (Signed)
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 He reports that the pain came on yesterday and was likely triggered by fatigue after he celebrated his birthday last weekend. Pt also had ETOH socially last weekend which is known to be a trigger of his crises. He attempted to control his pain with oral analgesics but was unsuccessful. He describes pain as 8/10 and localized to his chest wall bilaterally. The pain is throbbing in nature and occassionally sharp. It is non-radiating and not associated with any other symptoms. He denies any associated symptoms.   Hospital Course:  Pt was initially treated with weight based rapid re-dosing IVF and Toradol . Pain remained above 7/10 and Pt was transitioned to a weight based Dilaudid PCA . Patient's pain decreased to 4/10  And his function was at baseline at the time of discharge. He is discharged home in stable condition.    Discharge Exam:  Filed Vitals:   05/14/14 1344  BP: 107/67  Pulse: 81  Temp:   Resp: 20   Filed Vitals:   05/14/14 1043 05/14/14 1144 05/14/14 1244 05/14/14 1344  BP:  104/68 109/79 107/67  Pulse:  86 85 81  Temp:   98.3 F (36.8 C)   TempSrc:   Oral   Resp: 26 19 22 20   SpO2: 96% 94% 92% 93%     General: Alert, awake, oriented x3, in no acute distress.  HEENT: [place]/AT PEERL, EOMI, mild icterus Neck: Trachea midline,  no masses, no thyromegal,y no JVD, no carotid bruit OROPHARYNX:  Moist, No  exudate/ erythema/lesions.  Heart: Regular rate and rhythm, without murmurs, rubs, gallops, PMI non-displaced, no heaves or thrills on palpation.  Lungs: Clear to auscultation, no wheezing or rhonchi noted. No increased vocal fremitus resonant to percussion  Abdomen: Soft, nontender, nondistended, positive bowel sounds, no masses no hepatosplenomegaly noted..  Neuro: No focal neurological deficits noted cranial nerves II through XII grossly intact. DTRs 2+ bilaterally upper and lower extremities. Strength at baseline in bilateral upper and lower extremities. Musculoskeletal: No warm swelling or erythema around joints, no spinal tenderness noted.     Discharge Instructions  Discharge Instructions    Activity as tolerated - No restrictions    Complete by:  As directed      Diet general    Complete by:  As directed      Discharge patient    Complete by:  As directed             Medication List    TAKE these medications        diltiazem 120 MG 24 hr capsule  Commonly known as:  CARDIZEM CD  Take 1 capsule (120 mg total) by mouth daily.     folic acid 1 MG tablet  Commonly known as:  FOLVITE  Take 1 tablet (1 mg total) by mouth every morning.     HYDROmorphone 4 MG tablet  Commonly known as:  DILAUDID  Take  1 tablet (4 mg total) by mouth every 4 (four) hours as needed for severe pain.     hydroxyurea 500 MG capsule  Commonly known as:  HYDREA  Take 3 capsules (1,500 mg total) by mouth daily. May take with food to minimize GI side effects.     lisinopril 10 MG tablet  Commonly known as:  PRINIVIL,ZESTRIL  Take 1 tablet (10 mg total) by mouth daily.     metoprolol tartrate 25 MG tablet  Commonly known as:  LOPRESSOR  Take 1 tablet (25 mg total) by mouth daily.     morphine 30 MG 12 hr tablet  Commonly known as:  MS CONTIN  Take 1 tablet (30 mg total) by mouth every 12 (twelve) hours.     potassium chloride SA 20 MEQ tablet  Commonly known as:  K-DUR,KLOR-CON  Take 1  tablet (20 mEq total) by mouth every morning.     rivaroxaban 20 MG Tabs tablet  Commonly known as:  XARELTO  Take 1 tablet (20 mg total) by mouth every morning.     Vitamin D 2000 UNITS tablet  Take 1 tablet (2,000 Units total) by mouth daily.     zolpidem 10 MG tablet  Commonly known as:  AMBIEN  Take 1 tablet (10 mg total) by mouth at bedtime as needed for sleep.          The results of significant diagnostics from this hospitalization (including imaging, microbiology, ancillary and laboratory) are listed below for reference.    Significant Diagnostic Studies: Dg Chest 2 View  04/29/2014   CLINICAL DATA:  Sickle cell crisis.  Pain.  EXAM: CHEST  2 VIEW  COMPARISON:  04/24/2014 .  FINDINGS: Power Port catheter noted with lead tip projected over the cavoatrial junction. Mediastinum and hilar structures normal. Cardiomegaly. Stable bilateral pleural parenchymal scarring, no change. No acute infiltrate. No pleural effusion or pneumothorax. Surgical clips right upper quadrant. No acute bony abnormality.  IMPRESSION: 1. Stable changes of pleural parenchymal scarring. No acute infiltrate. 2. Stable cardiomegaly. 3. Power port catheter in stable position.   Electronically Signed   By: Marcello Moores  Register   On: 04/29/2014 08:29   Dg Chest 2 View  04/24/2014   CLINICAL DATA:  Sickle cell crisis with chest pain. Gradual onset over 2 days. Worse this morning.  EXAM: CHEST  2 VIEW  COMPARISON:  04/06/2014  FINDINGS: Left chest port remains in place. Cardiomegaly and prominent hila are unchanged. Pulmonary vasculature is normal, no pulmonary edema. Scarring predominantly in the lower lobes, unchanged from prior exam. No consolidation. No pleural effusion or pneumothorax. No acute osseous abnormalities are seen.  IMPRESSION: Stable chronic change, no acute pulmonary process.   Electronically Signed   By: Jeb Levering M.D.   On: 04/24/2014 06:39    Microbiology: Recent Results (from the past 240  hour(s))  MRSA PCR Screening     Status: None   Collection Time: 05/05/14 10:00 AM  Result Value Ref Range Status   MRSA by PCR NEGATIVE NEGATIVE Final    Comment:        The GeneXpert MRSA Assay (FDA approved for NASAL specimens only), is one component of a comprehensive MRSA colonization surveillance program. It is not intended to diagnose MRSA infection nor to guide or monitor treatment for MRSA infections.      Labs: Basic Metabolic Panel:  Recent Labs Lab 05/14/14 0907  NA 137  K 3.9  CL 110  CO2 21  GLUCOSE 116*  BUN 14  CREATININE 0.64  CALCIUM 8.5   Liver Function Tests:  Recent Labs Lab 05/14/14 0907  AST 34  ALT 19  ALKPHOS 86  BILITOT 4.2*  PROT 7.4  ALBUMIN 4.1   No results for input(s): LIPASE, AMYLASE in the last 168 hours. No results for input(s): AMMONIA in the last 168 hours. CBC:  Recent Labs Lab 05/14/14 0907  WBC 17.5*  NEUTROABS 12.2*  HGB 7.4*  HCT 21.8*  MCV 96.9  PLT 632*   Cardiac Enzymes: No results for input(s): CKTOTAL, CKMB, CKMBINDEX, TROPONINI in the last 168 hours. BNP: Invalid input(s): POCBNP CBG: No results for input(s): GLUCAP in the last 168 hours. Ferritin: No results for input(s): FERRITIN in the last 168 hours.  Time coordinating discharge: 35  Signed:  [name],[name]  05/14/2014, 2:27 PM

## 2014-05-14 NOTE — Telephone Encounter (Signed)
Notified patient that, per MD, he may come to day hospital for evaluation; pt verbalizes understanding

## 2014-05-14 NOTE — Telephone Encounter (Signed)
Pt called and states experiencing pain in rib areas; rates pain 7/10; denies CP, SOB, nausea, vomiting, or diarrhea; states took home medications with on relief; MD notified

## 2014-05-15 IMAGING — CR DG CHEST 2V
2 series · 2 of 2 positions shown · non-contrast
Comparison: 05/30/2011

CLINICAL DATA: Sickle cell, leukocytosis.

CHEST - 2 VIEW

[w chest pa]
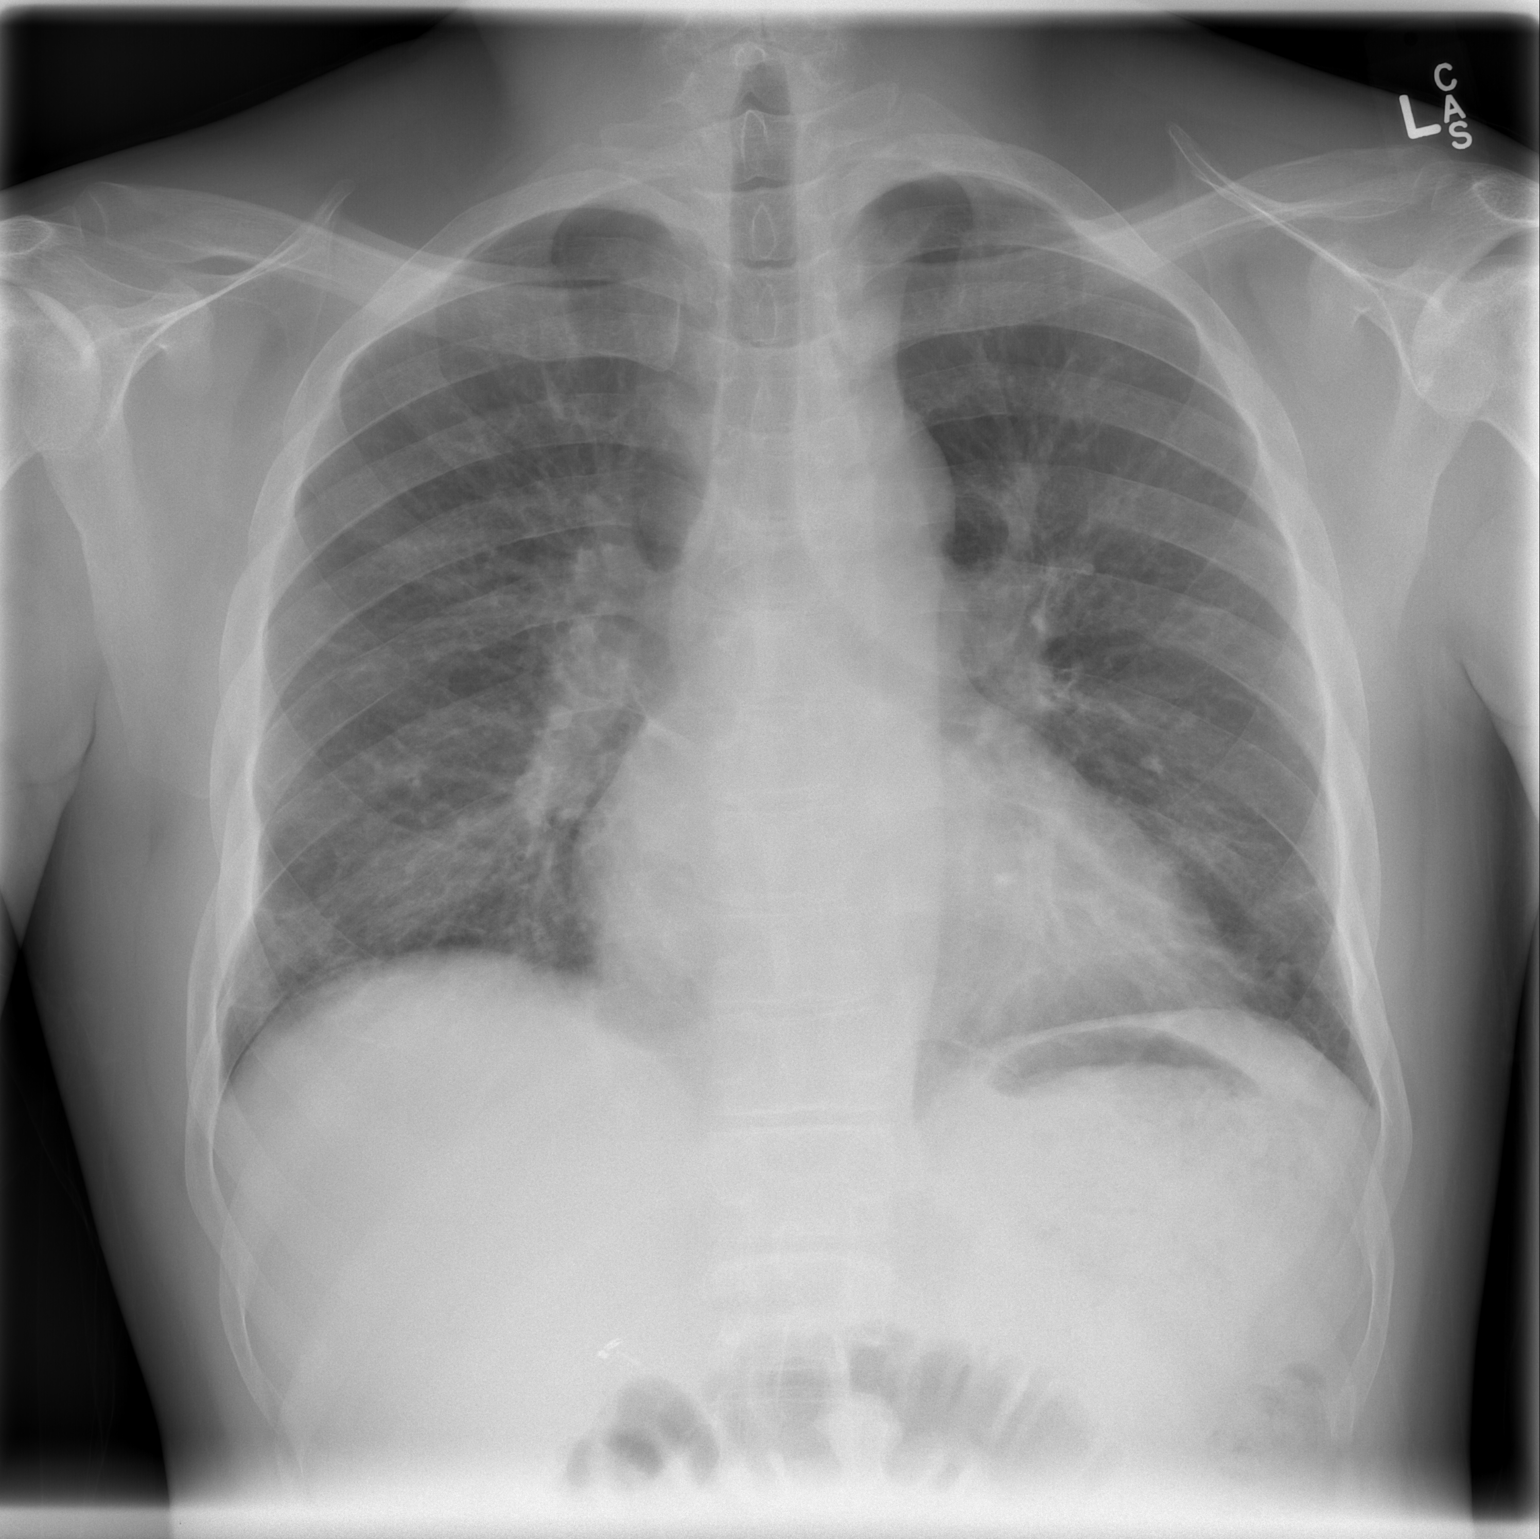

[w chest lat]
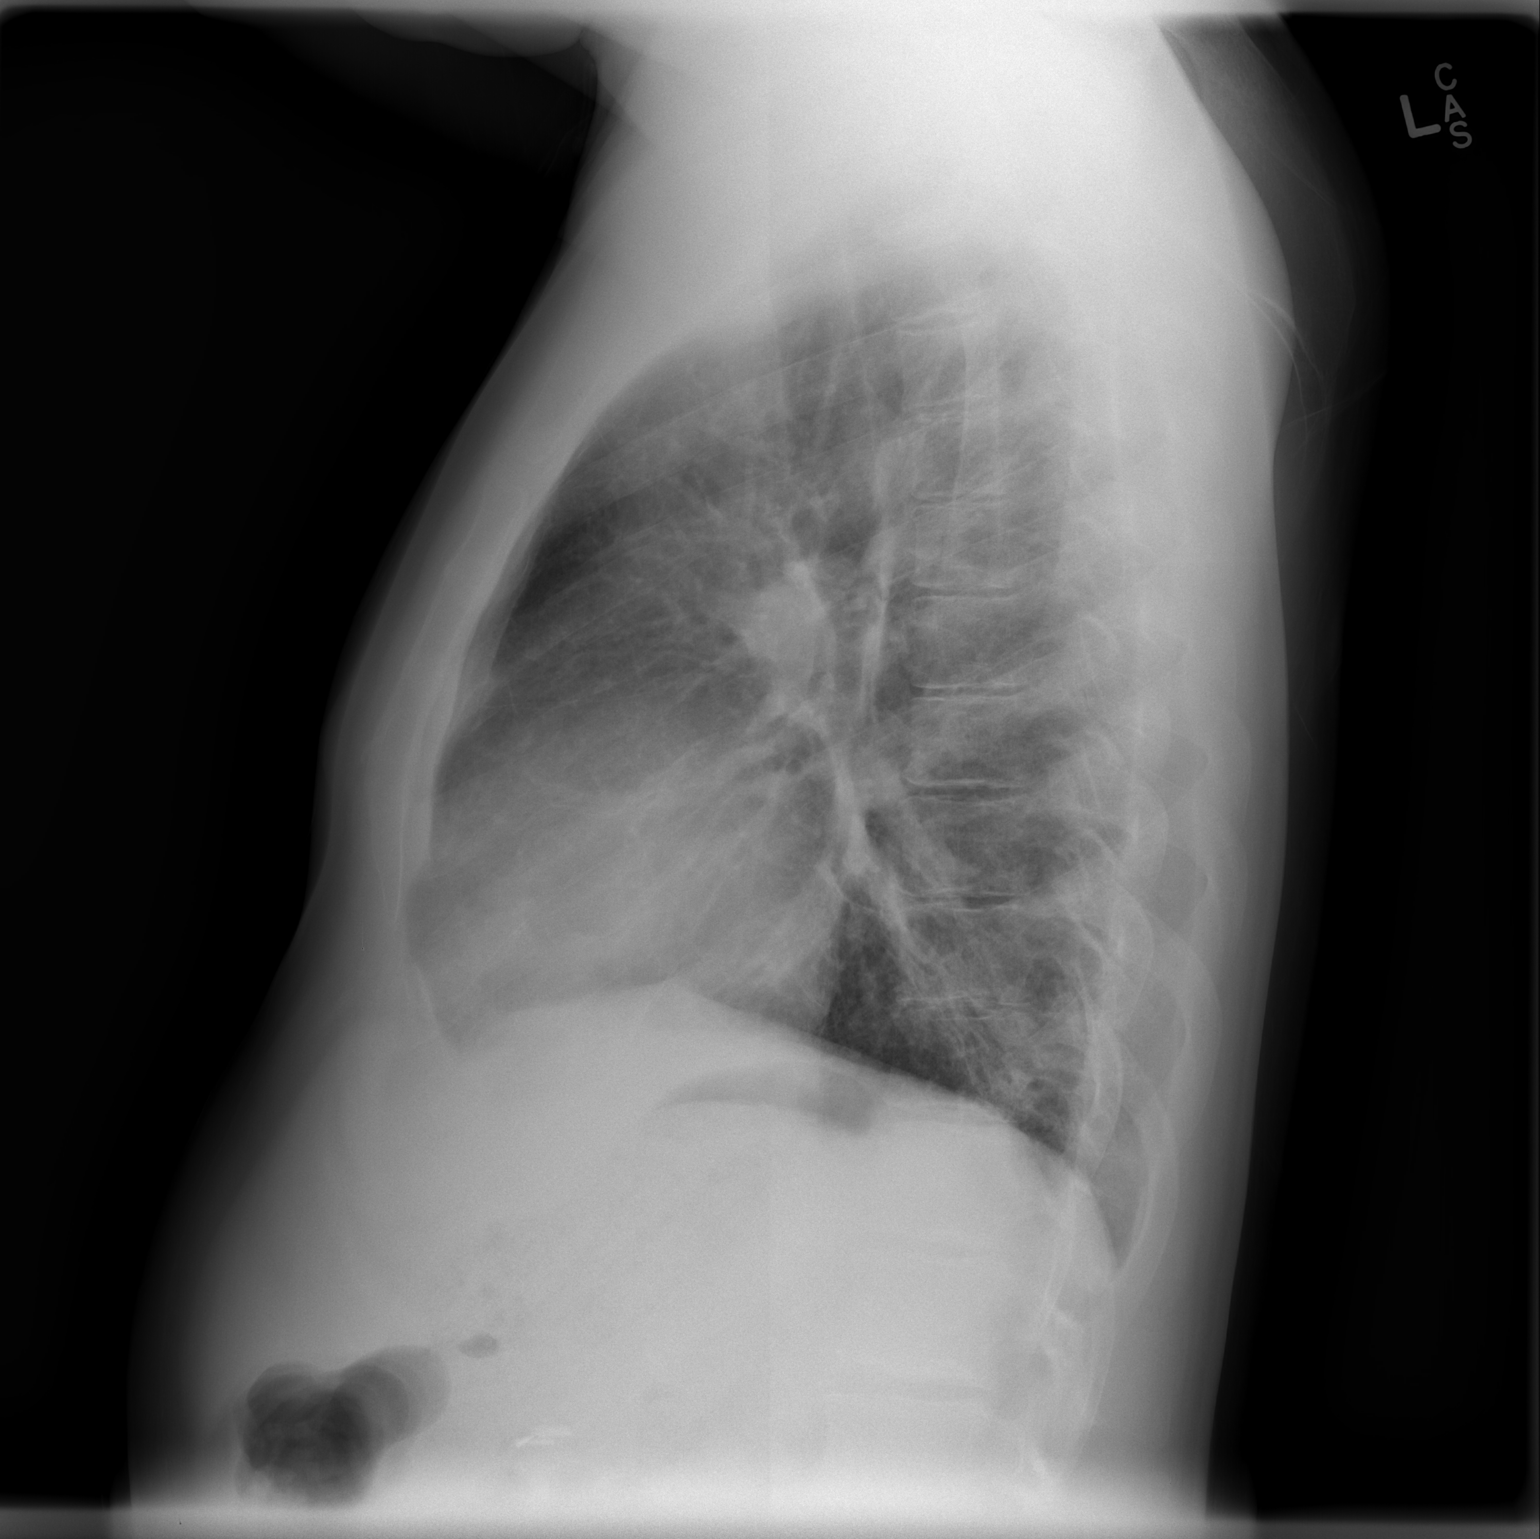

[2 of 2 positions shown; findings below may reference images not displayed]

FINDINGS: Mild cardiac enlargement and central vascular fullness is
similar to prior.  There is a mild interstitial prominence /
peribronchial thickening, which may be chronic, similar to prior.
Increased markings at the lung bases right greater than left, are
similar to prior.  No new focal areas of consolidation.  No pleural
effusion or pneumothorax.  Surgical clips right upper quadrant.  No
acute osseous finding.
IMPRESSION: Cardiomegaly with central vascular congestion.

Chronic interstitial / bronchitic changes.

Mild lung base opacities are similar to the prior, favored to be
scarring or atelectasis.  Infiltrate not entirely excluded.

## 2014-05-21 ENCOUNTER — Telehealth: Payer: Self-pay | Admitting: Internal Medicine

## 2014-05-21 DIAGNOSIS — G894 Chronic pain syndrome: Secondary | ICD-10-CM

## 2014-05-21 DIAGNOSIS — D571 Sickle-cell disease without crisis: Secondary | ICD-10-CM

## 2014-05-21 MED ORDER — MORPHINE SULFATE ER 30 MG PO TBCR: 30.0000 mg | EXTENDED_RELEASE_TABLET | Freq: Two times a day (BID) | ORAL | Status: DC

## 2014-05-21 NOTE — Telephone Encounter (Signed)
Refill request for ms contin 30mg . LOV 02/13/2014. Please advise. Thanks!

## 2014-05-21 NOTE — Telephone Encounter (Signed)
Prescription written for MS Contin #60 tabs. [place] reviewed and no inconsistencies noted.

## 2014-05-22 ENCOUNTER — Emergency Department (HOSPITAL_COMMUNITY): Payer: Medicare Other

## 2014-05-22 ENCOUNTER — Emergency Department (HOSPITAL_COMMUNITY)
Admission: EM | Admit: 2014-05-22 | Discharge: 2014-05-22 | Disposition: A | Discharge status: Home / Self Care | Payer: Medicare Other | Attending: Emergency Medicine | Admitting: Emergency Medicine

## 2014-05-22 ENCOUNTER — Encounter (HOSPITAL_COMMUNITY): Payer: Self-pay | Admitting: Emergency Medicine

## 2014-05-22 ENCOUNTER — Encounter: Payer: Self-pay | Admitting: Cardiology

## 2014-05-22 DIAGNOSIS — Z8619 Personal history of other infectious and parasitic diseases: Secondary | ICD-10-CM | POA: Insufficient documentation

## 2014-05-22 DIAGNOSIS — Q8901 Asplenia (congenital): Secondary | ICD-10-CM | POA: Diagnosis not present

## 2014-05-22 DIAGNOSIS — I10 Essential (primary) hypertension: Secondary | ICD-10-CM | POA: Diagnosis not present

## 2014-05-22 DIAGNOSIS — Z86711 Personal history of pulmonary embolism: Secondary | ICD-10-CM | POA: Diagnosis not present

## 2014-05-22 DIAGNOSIS — Z79899 Other long term (current) drug therapy: Secondary | ICD-10-CM | POA: Insufficient documentation

## 2014-05-22 DIAGNOSIS — R05 Cough: Secondary | ICD-10-CM | POA: Diagnosis not present

## 2014-05-22 DIAGNOSIS — Z7901 Long term (current) use of anticoagulants: Secondary | ICD-10-CM | POA: Insufficient documentation

## 2014-05-22 DIAGNOSIS — D5700 Hb-SS disease with crisis, unspecified: Secondary | ICD-10-CM | POA: Insufficient documentation

## 2014-05-22 DIAGNOSIS — Z8739 Personal history of other diseases of the musculoskeletal system and connective tissue: Secondary | ICD-10-CM | POA: Insufficient documentation

## 2014-05-22 DIAGNOSIS — E876 Hypokalemia: Secondary | ICD-10-CM | POA: Diagnosis not present

## 2014-05-22 DIAGNOSIS — Z9114 Patient's other noncompliance with medication regimen: Secondary | ICD-10-CM | POA: Insufficient documentation

## 2014-05-22 DIAGNOSIS — R059 Cough, unspecified: Secondary | ICD-10-CM

## 2014-05-22 DIAGNOSIS — Z87891 Personal history of nicotine dependence: Secondary | ICD-10-CM | POA: Insufficient documentation

## 2014-05-22 LAB — CBC WITH DIFFERENTIAL/PLATELET
BASOS ABS: 0.2 10*3/uL — AB (ref 0.0–0.1)
Basophils Relative: 1 % (ref 0–1)
EOS PCT: 1 % (ref 0–5)
Eosinophils Absolute: 0.2 10*3/uL (ref 0.0–0.7)
HEMATOCRIT: 19.9 % — AB (ref 39.0–52.0)
Hemoglobin: 6.9 g/dL — CL (ref 13.0–17.0)
LYMPHS ABS: 2.4 10*3/uL (ref 0.7–4.0)
LYMPHS PCT: 14 % (ref 12–46)
MCH: 34.3 pg — ABNORMAL HIGH (ref 26.0–34.0)
MCHC: 34.7 g/dL (ref 30.0–36.0)
MCV: 99 fL (ref 78.0–100.0)
MONOS PCT: 16 % — AB (ref 3–12)
Monocytes Absolute: 2.7 10*3/uL — ABNORMAL HIGH (ref 0.1–1.0)
Neutro Abs: 11.3 10*3/uL — ABNORMAL HIGH (ref 1.7–7.7)
Neutrophils Relative %: 68 % (ref 43–77)
PLATELETS: 376 10*3/uL (ref 150–400)
RBC: 2.01 MIL/uL — AB (ref 4.22–5.81)
RDW: 23 % — ABNORMAL HIGH (ref 11.5–15.5)
WBC: 16.8 10*3/uL — AB (ref 4.0–10.5)

## 2014-05-22 LAB — COMPREHENSIVE METABOLIC PANEL
ALBUMIN: 4 g/dL (ref 3.5–5.2)
ALT: 21 U/L (ref 0–53)
ANION GAP: 1 — AB (ref 5–15)
AST: 34 U/L (ref 0–37)
Alkaline Phosphatase: 83 U/L (ref 39–117)
BUN: 12 mg/dL (ref 6–23)
CALCIUM: 8.4 mg/dL (ref 8.4–10.5)
CO2: 23 mmol/L (ref 19–32)
CREATININE: 0.56 mg/dL (ref 0.50–1.35)
Chloride: 108 mmol/L (ref 96–112)
GFR calc non Af Amer: >90 mL/min (ref >90)
GLUCOSE: 109 mg/dL — AB (ref 70–99)
Potassium: 3.5 mmol/L (ref 3.5–5.1)
SODIUM: 132 mmol/L — AB (ref 135–145)
TOTAL PROTEIN: 7.2 g/dL (ref 6.0–8.3)
Total Bilirubin: 5.3 mg/dL — ABNORMAL HIGH (ref 0.3–1.2)

## 2014-05-22 LAB — URINALYSIS, ROUTINE W REFLEX MICROSCOPIC
Bilirubin Urine: NEGATIVE
GLUCOSE, UA: NEGATIVE mg/dL
HGB URINE DIPSTICK: NEGATIVE
KETONES UR: NEGATIVE mg/dL
LEUKOCYTES UA: NEGATIVE
Nitrite: NEGATIVE
PH: 6.5 (ref 5.0–8.0)
PROTEIN: NEGATIVE mg/dL
SPECIFIC GRAVITY, URINE: 1.012 (ref 1.005–1.030)
Urobilinogen, UA: 1 mg/dL (ref 0.0–1.0)

## 2014-05-22 LAB — RETICULOCYTES
RBC.: 2.01 MIL/uL — ABNORMAL LOW (ref 4.22–5.81)
Retic Count, Absolute: 197 10*3/uL — ABNORMAL HIGH (ref 19.0–186.0)
Retic Ct Pct: 9.8 % — ABNORMAL HIGH (ref 0.4–3.1)

## 2014-05-22 MED ORDER — HEPARIN SOD (PORK) LOCK FLUSH 100 UNIT/ML IV SOLN
500.0000 [IU] | Freq: Once | INTRAVENOUS | Status: AC
  Administered 2014-05-22: 500 [IU]
  Filled 2014-05-22: qty 5

## 2014-05-22 MED ORDER — HYDROMORPHONE HCL 2 MG/ML IJ SOLN
2.0000 mg | Freq: Once | INTRAMUSCULAR | Status: AC
  Administered 2014-05-22: 2 mg via INTRAVENOUS
  Filled 2014-05-22: qty 1

## 2014-05-22 MED ORDER — DIPHENHYDRAMINE HCL 50 MG/ML IJ SOLN
25.0000 mg | INTRAMUSCULAR | Status: DC | PRN
  Administered 2014-05-22: 25 mg via INTRAVENOUS
  Filled 2014-05-22: qty 0.5

## 2014-05-22 MED ORDER — HYDROMORPHONE HCL 2 MG/ML IJ SOLN
2.0000 mg | Freq: Once | INTRAMUSCULAR | Status: AC
Start: 2014-05-22 — End: 2014-05-22
  Administered 2014-05-22: 2 mg via INTRAVENOUS
  Filled 2014-05-22: qty 1

## 2014-05-22 NOTE — ED Notes (Signed)
Attempted lab draw x1. Unsuccessful. Pt states that he wants his port accessed. RN Larkin Ina notified.

## 2014-05-22 NOTE — ED Provider Notes (Signed)
CSN: [id number]     Arrival date & time 05/22/14  0019 History   First MD Initiated Contact with Patient 05/22/14 0406     Chief Complaint  Patient presents with  . Sickle Cell Pain Crisis     (Consider location/radiation/quality/duration/timing/severity/associated sxs/prior Treatment) HPI  This is a 34 year old male with sickle cell SS disease with a history of multiple visits to the ED for pain crises. He is here with his typical sickle cell pain crisis pain in both flanks and legs. He rates his pain as severe and not controlled with his oral Diluadid. He denies chest pain, fever, chills, nausea, vomiting or diarrhea. He has had a cough and scratchy throat for the last 2 days.  Past Medical History  Diagnosis Date  . Sickle cell anemia   . Blood transfusion   . Acute embolism and thrombosis of right internal jugular vein   . Hypokalemia   . Mood disorder   . History of pulmonary embolus (PE)   . Avascular necrosis   . Leukocytosis     Chronic  . Thrombocytosis     Chronic  . Hypertension   . History of Clostridium difficile infection   . Uses marijuana   . Chronic anticoagulation   . Functional asplenia   . Former smoker   . Second hand tobacco smoke exposure   . Alcohol consumption of one to four drinks per day   . Noncompliance with medication regimen   . Sickle-cell crisis with associated acute chest syndrome 05/13/2013  . Acute chest syndrome 06/18/2013  . Demand ischemia 01/02/2014   Past Surgical History  Procedure Laterality Date  . Right hip replacement      08/2006  . Cholecystectomy      01/2008  . Porta cath placement    . Porta cath removal    . Umbilical hernia repair      01/2008  . Excision of left periauricular cyst      10/2009  . Excision of right ear lobe cyst with primary closur      11/2007  . Portacath placement  01/05/2012    Procedure: INSERTION PORT-A-CATH;  Surgeon: Odis Hollingshead, MD;  Location: [facility];  Service: General;  Laterality:  N/A;  ultrasound guiced port a cath insertion with fluoroscopy   Family History  Problem Relation Age of Onset  . Sickle cell trait Mother   . Depression Mother   . Diabetes Mother   . Sickle cell trait Father   . Sickle cell trait Brother    History  Substance Use Topics  . Smoking status: Former Smoker -- 13 years    Quit date: 07/08/2010  . Smokeless tobacco: Never Used  . Alcohol Use: No    Review of Systems  All other systems reviewed and are negative.   Allergies  Review of patient's allergies indicates no known allergies.  Home Medications   Prior to Admission medications   Medication Sig Start Date End Date Taking? Authorizing Provider  Cholecalciferol (VITAMIN D) 2000 UNITS tablet Take 1 tablet (2,000 Units total) by mouth daily. 02/13/14  Yes [name], MD  diltiazem (CARDIZEM CD) 120 MG 24 hr capsule Take 1 capsule (120 mg total) by mouth daily. 03/13/14  Yes Costin Karlyne Greenspan, MD  folic acid (FOLVITE) 1 MG tablet Take 1 tablet (1 mg total) by mouth every morning. 10/08/13  Yes [name], MD  HYDROmorphone (DILAUDID) 4 MG tablet Take 1 tablet (4 mg total) by mouth  every 4 (four) hours as needed for severe pain. 05/02/14  Yes [name], MD  hydroxyurea (HYDREA) 500 MG capsule Take 3 capsules (1,500 mg total) by mouth daily. May take with food to minimize GI side effects. 10/30/13  Yes [name], MD  lisinopril (PRINIVIL,ZESTRIL) 10 MG tablet Take 1 tablet (10 mg total) by mouth daily. 01/22/14  Yes [name], MD  metoprolol tartrate (LOPRESSOR) 25 MG tablet Take 1 tablet (25 mg total) by mouth daily. 08/22/13  Yes [name], MD  morphine (MS CONTIN) 30 MG 12 hr tablet Take 1 tablet (30 mg total) by mouth every 12 (twelve) hours. 05/21/14  Yes [name], MD  potassium chloride SA (K-DUR,KLOR-CON) 20 MEQ tablet Take 1 tablet (20 mEq total) by mouth every morning. 09/25/13  Yes [name], MD  rivaroxaban  (XARELTO) 20 MG TABS tablet Take 1 tablet (20 mg total) by mouth every morning. 03/13/14  Yes Costin Karlyne Greenspan, MD  zolpidem (AMBIEN) 10 MG tablet Take 1 tablet (10 mg total) by mouth at bedtime as needed for sleep. 04/01/14  Yes [name], MD   BP 111/64 mmHg  Pulse 75  Temp(Src) 99.2 F (37.3 C) (Oral)  Resp 18  SpO2 96%   Physical Exam  General: Well-developed, well-nourished male in no acute distress; appearance consistent with age of record HENT: normocephalic; atraumatic Eyes: pupils equal, round and reactive to light; extraocular muscles intact; scleral icterus Neck: supple Heart: regular rate and rhythm Lungs: clear to auscultation bilaterally Abdomen: soft; nondistended; nontender; no masses or hepatosplenomegaly; bowel sounds present GU: No CVA tenderness Extremities: No deformity; full range of motion; pulses normal Neurologic: Awake, alert and oriented; motor function intact in all extremities and symmetric; no facial droop Skin: Warm and dry Psychiatric: Normal mood and affect    ED Course  Procedures (including critical care time)   MDM   Nursing notes and vitals signs, including pulse oximetry, reviewed.  Summary of this visit's results, reviewed by myself:  Labs:  Results for orders placed or performed during the hospital encounter of 05/22/14 (from the past 24 hour(s))  CBC WITH DIFFERENTIAL     Status: Abnormal   Collection Time: 05/22/14  1:30 AM  Result Value Ref Range   WBC 16.8 (H) 4.0 - 10.5 K/uL   RBC 2.01 (L) 4.22 - 5.81 MIL/uL   Hemoglobin 6.9 (LL) 13.0 - 17.0 g/dL   HCT 19.9 (L) 39.0 - 52.0 %   MCV 99.0 78.0 - 100.0 fL   MCH 34.3 (H) 26.0 - 34.0 pg   MCHC 34.7 30.0 - 36.0 g/dL   RDW 23.0 (H) 11.5 - 15.5 %   Platelets 376 150 - 400 K/uL   Neutrophils Relative % 68 43 - 77 %   Lymphocytes Relative 14 12 - 46 %   Monocytes Relative 16 (H) 3 - 12 %   Eosinophils Relative 1 0 - 5 %   Basophils Relative 1 0 - 1 %   Neutro Abs 11.3 (H)  1.7 - 7.7 K/uL   Lymphs Abs 2.4 0.7 - 4.0 K/uL   Monocytes Absolute 2.7 (H) 0.1 - 1.0 K/uL   Eosinophils Absolute 0.2 0.0 - 0.7 K/uL   Basophils Absolute 0.2 (H) 0.0 - 0.1 K/uL   RBC Morphology POLYCHROMASIA PRESENT    Smear Review LARGE PLATELETS PRESENT   Comprehensive metabolic panel     Status: Abnormal   Collection Time: 05/22/14  1:30 AM  Result  Value Ref Range   Sodium 132 (L) 135 - 145 mmol/L   Potassium 3.5 3.5 - 5.1 mmol/L   Chloride 108 96 - 112 mmol/L   CO2 23 19 - 32 mmol/L   Glucose, Bld 109 (H) 70 - 99 mg/dL   BUN 12 6 - 23 mg/dL   Creatinine, Ser 0.56 0.50 - 1.35 mg/dL   Calcium 8.4 8.4 - 10.5 mg/dL   Total Protein 7.2 6.0 - 8.3 g/dL   Albumin 4.0 3.5 - 5.2 g/dL   AST 34 0 - 37 U/L   ALT 21 0 - 53 U/L   Alkaline Phosphatase 83 39 - 117 U/L   Total Bilirubin 5.3 (H) 0.3 - 1.2 mg/dL   GFR calc non Af Amer >90 >90 mL/min   GFR calc Af Amer >90 >90 mL/min   Anion gap 1 (L) 5 - 15  Reticulocytes     Status: Abnormal   Collection Time: 05/22/14  1:30 AM  Result Value Ref Range   Retic Ct Pct 9.8 (H) 0.4 - 3.1 %   RBC. 2.01 (L) 4.22 - 5.81 MIL/uL   Retic Count, Manual 197.0 (H) 19.0 - 186.0 K/uL  Urinalysis, Routine w reflex microscopic     Status: None   Collection Time: 05/22/14  1:44 AM  Result Value Ref Range   Color, Urine YELLOW YELLOW   APPearance CLEAR CLEAR   Specific Gravity, Urine 1.012 1.005 - 1.030   pH 6.5 5.0 - 8.0   Glucose, UA NEGATIVE NEGATIVE mg/dL   Hgb urine dipstick NEGATIVE NEGATIVE   Bilirubin Urine NEGATIVE NEGATIVE   Ketones, ur NEGATIVE NEGATIVE mg/dL   Protein, ur NEGATIVE NEGATIVE mg/dL   Urobilinogen, UA 1.0 0.0 - 1.0 mg/dL   Nitrite NEGATIVE NEGATIVE   Leukocytes, UA NEGATIVE NEGATIVE    Imaging Studies: Dg Chest 2 View  05/22/2014   CLINICAL DATA:  Dry cough for 2 days.  EXAM: CHEST  2 VIEW  COMPARISON:  04/29/2014  FINDINGS: Left subclavian porta catheter, tip at the upper cavoatrial junction.  Stable mild cardiomegaly.   Stable aortic and hilar contours.  Scattered interstitial opacity consistent with mild scarring. There is no edema, consolidation, effusion, or pneumothorax.  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  IMPRESSION: 1. No active cardiopulmonary disease. 2. Cardiomegaly and mild lung scarring.   Electronically Signed   By: Monte Fantasia M.D.   On: 05/22/2014 04:44   7:24 AM Patient feeling better after 3 doses of Dilaudid. He states he is ready to go home. His hemoglobin while low is consistent with past hemoglobins.    Shanon Rosser, MD 05/22/14 [phone]

## 2014-05-22 NOTE — ED Notes (Signed)
Sandwich and coke given at patient request, patient resting in a position of stated comfort, NAD noted at this time.

## 2014-05-22 NOTE — ED Notes (Signed)
Pt is having pain similar to his sickle cell crisis and is having pain in both flanks. Alert and oriented.

## 2014-05-23 IMAGING — XA IR US GUIDE VASC ACCESS RIGHT
1 series · 1 of 1 positions shown · non-contrast
Comparison: none

CLINICAL DATA: Sickle Cell crisis in need of IV access

[Series 300: line placements · 1 of 1 slices shown]
[im 1/1]
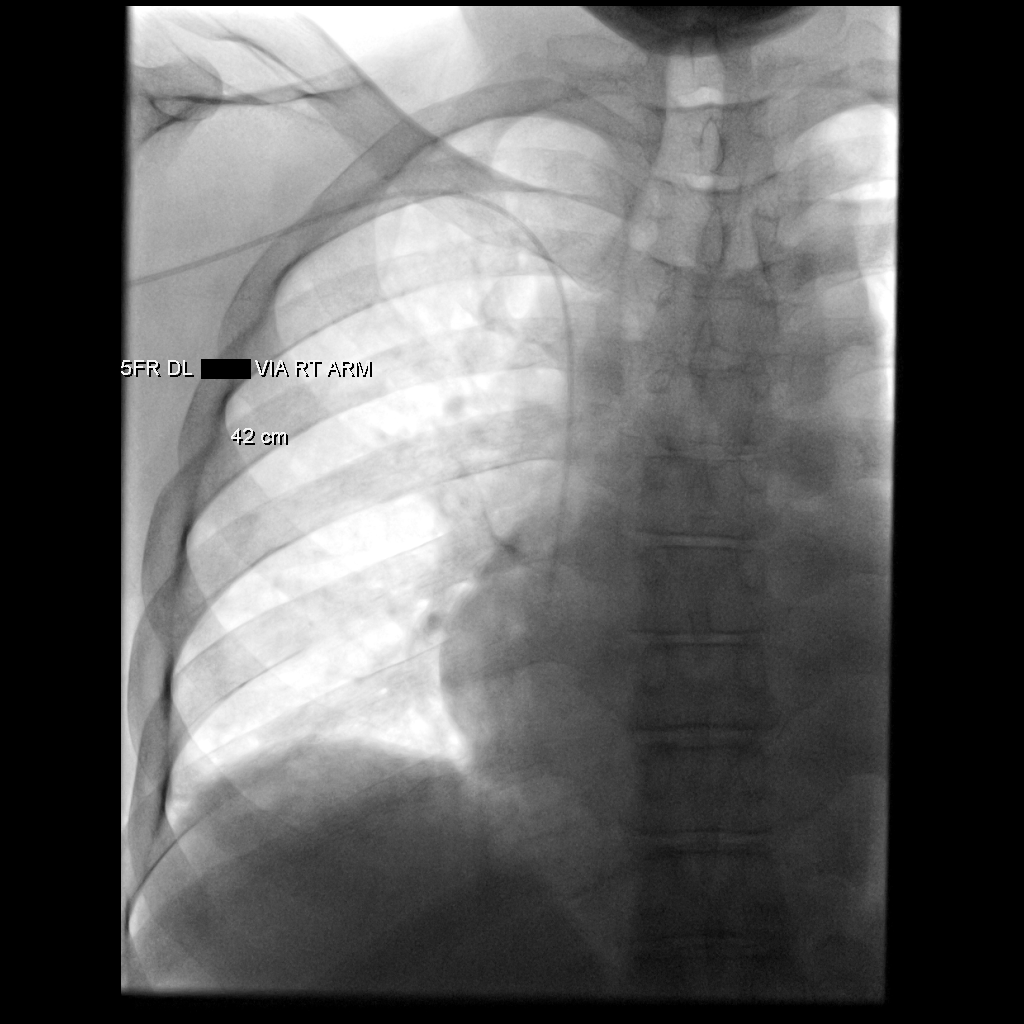

[1 of 1 positions shown; findings below may reference images not displayed]

PICC LINE PLACEMENT WITH ULTRASOUND AND FLUOROSCOPIC  GUIDANCE

Fluoroscopy Time: 0.1 minutes.

The right arm was prepped with chlorhexidine, draped in the usual
sterile fashion using maximum barrier technique (cap and mask,
sterile gown, sterile gloves, large sterile sheet, hand hygiene and
cutaneous antisepsis) and infiltrated locally with 1% Lidocaine.

Ultrasound demonstrated patency of the right brachial vein, and
this was documented with an image.  Under real-time ultrasound
guidance, this vein was accessed with a 21 gauge micropuncture
needle and image documentation was performed.  The needle was
exchanged over a guidewire for a peel-away sheath through which a 5
French dual lumen PICC trimmed to 42 cm was advanced, positioned
with its tip at the lower SVC/right atrial junction.  Fluoroscopy
during the procedure and fluoro spot radiograph confirms
appropriate catheter position.  The catheter was flushed, secured
to the skin with Prolene sutures, and covered with a sterile
dressing.

Complications:  None immediate
IMPRESSION: Successful right arm PICC line placement with ultrasound and
fluoroscopic guidance.  The catheter is ready for use.

Read by: Recebov, Nekffasi.-GIORGI

## 2014-05-24 ENCOUNTER — Encounter (HOSPITAL_COMMUNITY): Payer: Self-pay | Admitting: *Deleted

## 2014-05-24 ENCOUNTER — Emergency Department (HOSPITAL_COMMUNITY)
Admission: EM | Admit: 2014-05-24 | Discharge: 2014-05-24 | Disposition: A | Discharge status: Home / Self Care | Payer: Medicare Other | Attending: Emergency Medicine | Admitting: Emergency Medicine

## 2014-05-24 DIAGNOSIS — Z86711 Personal history of pulmonary embolism: Secondary | ICD-10-CM | POA: Diagnosis not present

## 2014-05-24 DIAGNOSIS — I10 Essential (primary) hypertension: Secondary | ICD-10-CM | POA: Diagnosis not present

## 2014-05-24 DIAGNOSIS — D5700 Hb-SS disease with crisis, unspecified: Secondary | ICD-10-CM

## 2014-05-24 DIAGNOSIS — Z87891 Personal history of nicotine dependence: Secondary | ICD-10-CM | POA: Diagnosis not present

## 2014-05-24 DIAGNOSIS — Z8619 Personal history of other infectious and parasitic diseases: Secondary | ICD-10-CM | POA: Diagnosis not present

## 2014-05-24 DIAGNOSIS — Z8659 Personal history of other mental and behavioral disorders: Secondary | ICD-10-CM | POA: Diagnosis not present

## 2014-05-24 DIAGNOSIS — Z8639 Personal history of other endocrine, nutritional and metabolic disease: Secondary | ICD-10-CM | POA: Insufficient documentation

## 2014-05-24 DIAGNOSIS — Z7901 Long term (current) use of anticoagulants: Secondary | ICD-10-CM | POA: Diagnosis not present

## 2014-05-24 DIAGNOSIS — Z79899 Other long term (current) drug therapy: Secondary | ICD-10-CM | POA: Diagnosis not present

## 2014-05-24 LAB — COMPREHENSIVE METABOLIC PANEL
ALK PHOS: 70 U/L (ref 39–117)
ALT: 27 U/L (ref 0–53)
AST: 41 U/L — ABNORMAL HIGH (ref 0–37)
Albumin: 4.5 g/dL (ref 3.5–5.2)
Anion gap: 9 (ref 5–15)
BILIRUBIN TOTAL: 5 mg/dL — AB (ref 0.3–1.2)
BUN: 14 mg/dL (ref 6–23)
CO2: 23 mmol/L (ref 19–32)
Calcium: 8.9 mg/dL (ref 8.4–10.5)
Chloride: 106 mmol/L (ref 96–112)
Creatinine, Ser: 0.65 mg/dL (ref 0.50–1.35)
GFR calc non Af Amer: >90 mL/min (ref >90)
Glucose, Bld: 110 mg/dL — ABNORMAL HIGH (ref 70–99)
POTASSIUM: 3.8 mmol/L (ref 3.5–5.1)
SODIUM: 138 mmol/L (ref 135–145)
Total Protein: 8.2 g/dL (ref 6.0–8.3)

## 2014-05-24 LAB — CBC WITH DIFFERENTIAL/PLATELET
BASOS ABS: 0.2 10*3/uL — AB (ref 0.0–0.1)
Basophils Relative: 1 % (ref 0–1)
EOS ABS: 0 10*3/uL (ref 0.0–0.7)
EOS PCT: 0 % (ref 0–5)
HCT: 19.9 % — ABNORMAL LOW (ref 39.0–52.0)
HEMOGLOBIN: 6.9 g/dL — AB (ref 13.0–17.0)
Lymphocytes Relative: 14 % (ref 12–46)
Lymphs Abs: 2.1 10*3/uL (ref 0.7–4.0)
MCH: 32.9 pg (ref 26.0–34.0)
MCHC: 34.7 g/dL (ref 30.0–36.0)
MCV: 94.8 fL (ref 78.0–100.0)
MONO ABS: 1.5 10*3/uL — AB (ref 0.1–1.0)
Monocytes Relative: 10 % (ref 3–12)
Neutro Abs: 11.5 10*3/uL — ABNORMAL HIGH (ref 1.7–7.7)
Neutrophils Relative %: 75 % (ref 43–77)
Platelets: 388 10*3/uL (ref 150–400)
RBC: 2.1 MIL/uL — AB (ref 4.22–5.81)
RDW: 21.8 % — ABNORMAL HIGH (ref 11.5–15.5)
WBC: 15.3 10*3/uL — AB (ref 4.0–10.5)

## 2014-05-24 LAB — RETICULOCYTES
RBC.: 2.1 MIL/uL — ABNORMAL LOW (ref 4.22–5.81)
Retic Count, Absolute: 165.9 10*3/uL (ref 19.0–186.0)
Retic Ct Pct: 7.9 % — ABNORMAL HIGH (ref 0.4–3.1)

## 2014-05-24 MED ORDER — DIPHENHYDRAMINE HCL 50 MG/ML IJ SOLN
12.5000 mg | INTRAMUSCULAR | Status: DC | PRN
  Administered 2014-05-24: 12.5 mg via INTRAVENOUS
  Filled 2014-05-24: qty 1

## 2014-05-24 MED ORDER — HYDROMORPHONE HCL 2 MG/ML IJ SOLN
2.0000 mg | Freq: Once | INTRAMUSCULAR | Status: AC
  Administered 2014-05-24: 2 mg via INTRAVENOUS
  Filled 2014-05-24: qty 1

## 2014-05-24 MED ORDER — HEPARIN SOD (PORK) LOCK FLUSH 100 UNIT/ML IV SOLN
500.0000 [IU] | Freq: Once | INTRAVENOUS | Status: AC
  Administered 2014-05-24: 500 [IU]
  Filled 2014-05-24: qty 5

## 2014-05-24 MED ORDER — KETOROLAC TROMETHAMINE 30 MG/ML IJ SOLN
30.0000 mg | Freq: Once | INTRAMUSCULAR | Status: AC
  Administered 2014-05-24: 30 mg via INTRAVENOUS
  Filled 2014-05-24: qty 1

## 2014-05-24 MED ORDER — HYDROMORPHONE HCL 2 MG/ML IJ SOLN
2.0000 mg | INTRAMUSCULAR | Status: AC | PRN
  Administered 2014-05-24 (×2): 2 mg via INTRAVENOUS
  Filled 2014-05-24 (×2): qty 1

## 2014-05-24 MED ORDER — ONDANSETRON HCL 4 MG/2ML IJ SOLN
4.0000 mg | INTRAMUSCULAR | Status: DC | PRN
  Administered 2014-05-24: 4 mg via INTRAVENOUS
  Filled 2014-05-24: qty 2

## 2014-05-24 NOTE — ED Provider Notes (Signed)
CSN: [id number]     Arrival date & time 05/24/14  1745 History   First MD Initiated Contact with Patient 05/24/14 1826     Chief Complaint  Patient presents with  . Sickle Cell Pain Crisis     (Consider location/radiation/quality/duration/timing/severity/associated sxs/prior Treatment) Patient is a 34 y.o. male presenting with sickle cell pain. The history is provided by the patient.  Sickle Cell Pain Crisis Location:  Abdomen, lower extremity, L side and R side Severity:  Moderate Onset quality:  Gradual Similar to previous crisis episodes: yes   Timing:  Constant Progression:  Unchanged Chronicity:  Chronic Context: non-compliance (hasn't been able to get his PO dilaudid due to medicaid getting cancelled)   Context: not menses   Relieved by:  Nothing Worsened by:  Nothing tried Associated symptoms: no cough, no fever, no shortness of breath and no vomiting     Past Medical History  Diagnosis Date  . Sickle cell anemia   . Blood transfusion   . Acute embolism and thrombosis of right internal jugular vein   . Hypokalemia   . Mood disorder   . History of pulmonary embolus (PE)   . Avascular necrosis   . Leukocytosis     Chronic  . Thrombocytosis     Chronic  . Hypertension   . History of Clostridium difficile infection   . Uses marijuana   . Chronic anticoagulation   . Functional asplenia   . Former smoker   . Second hand tobacco smoke exposure   . Alcohol consumption of one to four drinks per day   . Noncompliance with medication regimen   . Sickle-cell crisis with associated acute chest syndrome 05/13/2013  . Acute chest syndrome 06/18/2013  . Demand ischemia 01/02/2014   Past Surgical History  Procedure Laterality Date  . Right hip replacement      08/2006  . Cholecystectomy      01/2008  . Porta cath placement    . Porta cath removal    . Umbilical hernia repair      01/2008  . Excision of left periauricular cyst      10/2009  . Excision of right ear lobe  cyst with primary closur      11/2007  . Portacath placement  01/05/2012    Procedure: INSERTION PORT-A-CATH;  Surgeon: Odis Hollingshead, MD;  Location: [place];  Service: General;  Laterality: N/A;  ultrasound guiced port a cath insertion with fluoroscopy   Family History  Problem Relation Age of Onset  . Sickle cell trait Mother   . Depression Mother   . Diabetes Mother   . Sickle cell trait Father   . Sickle cell trait Brother    History  Substance Use Topics  . Smoking status: Former Smoker -- 13 years    Quit date: 07/08/2010  . Smokeless tobacco: Never Used  . Alcohol Use: No    Review of Systems  Constitutional: Negative for fever.  Respiratory: Negative for cough and shortness of breath.   Gastrointestinal: Negative for vomiting and abdominal pain.  All other systems reviewed and are negative.     Allergies  Review of patient's allergies indicates no known allergies.  Home Medications   Prior to Admission medications   Medication Sig Start Date End Date Taking? Authorizing Provider  Cholecalciferol (VITAMIN D) 2000 UNITS tablet Take 1 tablet (2,000 Units total) by mouth daily. 02/13/14  Yes [name], MD  diltiazem (CARDIZEM CD) 120 MG 24 hr capsule Take  1 capsule (120 mg total) by mouth daily. 03/13/14  Yes Costin Karlyne Greenspan, MD  folic acid (FOLVITE) 1 MG tablet Take 1 tablet (1 mg total) by mouth every morning. 10/08/13  Yes [name], MD  HYDROmorphone (DILAUDID) 4 MG tablet Take 1 tablet (4 mg total) by mouth every 4 (four) hours as needed for severe pain. 05/02/14  Yes [name], MD  hydroxyurea (HYDREA) 500 MG capsule Take 3 capsules (1,500 mg total) by mouth daily. May take with food to minimize GI side effects. 10/30/13  Yes [name], MD  lisinopril (PRINIVIL,ZESTRIL) 10 MG tablet Take 1 tablet (10 mg total) by mouth daily. 01/22/14  Yes [name], MD  metoprolol tartrate (LOPRESSOR) 25 MG tablet Take 1 tablet (25 mg  total) by mouth daily. 08/22/13  Yes [name], MD  morphine (MS CONTIN) 30 MG 12 hr tablet Take 1 tablet (30 mg total) by mouth every 12 (twelve) hours. 05/21/14  Yes [name], MD  potassium chloride SA (K-DUR,KLOR-CON) 20 MEQ tablet Take 1 tablet (20 mEq total) by mouth every morning. 09/25/13  Yes [name], MD  rivaroxaban (XARELTO) 20 MG TABS tablet Take 1 tablet (20 mg total) by mouth every morning. 03/13/14  Yes Costin Karlyne Greenspan, MD  zolpidem (AMBIEN) 10 MG tablet Take 1 tablet (10 mg total) by mouth at bedtime as needed for sleep. 04/01/14  Yes [name], MD   BP 153/106 mmHg  Pulse 115  Temp(Src) 98.3 F (36.8 C) (Oral)  Resp 16  SpO2 96% Physical Exam  Constitutional: He is oriented to person, place, and time. He appears well-developed and well-nourished. No distress.  HENT:  Head: Normocephalic and atraumatic.  Mouth/Throat: No oropharyngeal exudate.  Eyes: EOM are normal. Pupils are equal, round, and reactive to light.  Neck: Normal range of motion. Neck supple.  Cardiovascular: Normal rate and regular rhythm.  Exam reveals no friction rub.   No murmur heard. Pulmonary/Chest: Effort normal and breath sounds normal. No respiratory distress. He has no wheezes. He has no rales.  Abdominal: He exhibits no distension. There is no tenderness. There is no rebound.  Musculoskeletal: Normal range of motion. He exhibits no edema.  Neurological: He is alert and oriented to person, place, and time.  Skin: He is not diaphoretic.  Nursing note and vitals reviewed.   ED Course  Procedures (including critical care time) Labs Review Labs Reviewed  CBC WITH DIFFERENTIAL/PLATELET  COMPREHENSIVE METABOLIC PANEL  RETICULOCYTES    Imaging Review No results found.   EKG Interpretation None      MDM   Final diagnoses:  Sickle cell pain crisis    34 year old male with history of sickle cell disease presents to the ED with pain crisis. Hasn't  had his by mouth pain medications and 5 days due to Medicaid getting canceled. No fever, vomiting, diarrhea. Pain in similar location ribs and bilateral upper extremities. No fevers. Well-appearing. Will put on the sickle cell protocol with scheduled doses of Dilaudid when necessary.  Labs show Hgb of 6.9. He states his hemoglobin baseline is around 8.5. I told him a and a blood transfusion would likely make him for better. He states he has PCP follow-up in 2 days and would rather not get admitted and just follow-up at that time. I feel this is appropriate. He is well-appearing and has had hemoglobins around this range for the past month.  Medicines improved his symptoms. Stable for discharge.  Evelina Bucy, MD 05/24/14 2232

## 2014-05-24 NOTE — ED Notes (Signed)
Patient c/o bilateral rib cage pain that radiates in tot he back. Patient states this is his usual pain area. No SOB.

## 2014-05-24 NOTE — Discharge Instructions (Signed)
Sickle Cell Anemia, Adult °Sickle cell anemia is a condition in which red blood cells have an abnormal "sickle" shape. This abnormal shape shortens the cells' life span, which results in a lower than normal concentration of red blood cells in the blood. The sickle shape also causes the cells to clump together and block free blood flow through the blood vessels. As a result, the tissues and organs of the body do not receive enough oxygen. Sickle cell anemia causes organ damage and pain and increases the risk of infection. °CAUSES  °Sickle cell anemia is a genetic disorder. Those who receive two copies of the gene have the condition, and those who receive one copy have the trait. °RISK FACTORS °The sickle cell gene is most common in people whose families originated in [place]. Other areas of the globe where sickle cell trait occurs include the [place], [place] and [place], the [place], and the [place].  °SIGNS AND SYMPTOMS °· Pain, especially in the extremities, back, chest, or abdomen (common). The pain may start suddenly or may develop following an illness, especially if there is dehydration. Pain can also occur due to overexertion or exposure to extreme temperature changes. °· Frequent severe bacterial infections, especially certain types of pneumonia and meningitis. °· Pain and swelling in the hands and feet. °· Decreased activity.   °· Loss of appetite.   °· Change in behavior. °· Headaches. °· Seizures. °· Shortness of breath or difficulty breathing. °· Vision changes. °· Skin ulcers. °Those with the trait may not have symptoms or they may have mild symptoms.  °DIAGNOSIS  °Sickle cell anemia is diagnosed with blood tests that demonstrate the genetic trait. It is often diagnosed during the newborn period, due to mandatory testing nationwide. A variety of blood tests, X-rays, CT scans, MRI scans, ultrasounds, and lung function tests may also be done to monitor the condition. °TREATMENT  °Sickle  cell anemia may be treated with: °· Medicines. You may be given pain medicines, antibiotic medicines (to treat and prevent infections) or medicines to increase the production of certain types of hemoglobin. °· Fluids. °· Oxygen. °· Blood transfusions. °HOME CARE INSTRUCTIONS  °· Drink enough fluid to keep your urine clear or pale yellow. Increase your fluid intake in hot weather and during exercise. °· Do not smoke. Smoking lowers oxygen levels in the blood.   °· Only take over-the-counter or prescription medicines for pain, fever, or discomfort as directed by your health care provider. °· Take antibiotics as directed by your health care provider. Make sure you finish them it even if you start to feel better.   °· Take supplements as directed by your health care provider.   °· Consider wearing a medical alert bracelet. This tells anyone caring for you in an emergency of your condition.   °· When traveling, keep your medical information, health care provider's names, and the medicines you take with you at all times.   °· If you develop a fever, do not take medicines to reduce the fever right away. This could cover up a problem that is developing. Notify your health care provider. °· Keep all follow-up appointments with your health care provider. Sickle cell anemia requires regular medical care. °SEEK MEDICAL CARE IF: ° You have a fever. °SEEK IMMEDIATE MEDICAL CARE IF:  °· You feel dizzy or faint.   °· You have new abdominal pain, especially on the left side near the stomach area.   °· You develop a persistent, often uncomfortable and painful penile erection (priapism). If this is not treated immediately it   will lead to impotence.   °· You have numbness your arms or legs or you have a hard time moving them.   °· You have a hard time with speech.   °· You have a fever or persistent symptoms for more than 2-3 days.   °· You have a fever and your symptoms suddenly get worse.   °· You have signs or symptoms of infection.  These include:   °¨ Chills.   °¨ Abnormal tiredness (lethargy).   °¨ Irritability.   °¨ Poor eating.   °¨ Vomiting.   °· You develop pain that is not helped with medicine.   °· You develop shortness of breath. °· You have pain in your chest.   °· You are coughing up pus-like or bloody sputum.   °· You develop a stiff neck. °· Your feet or hands swell or have pain. °· Your abdomen appears bloated. °· You develop joint pain. °MAKE SURE YOU: °· Understand these instructions. °Document Released: 04/27/2005 Document Revised: 06/03/2013 Document Reviewed: 08/29/2012 °[employer]® Patient Information ©2015 [employer]. This information is not intended to replace advice given to you by your health care provider. Make sure you discuss any questions you have with your health care provider. ° °

## 2014-05-24 NOTE — ED Notes (Signed)
Pt reports bila rib pain.  Pt reports this is where he normally has pain when he has a crisis.  Pt reports that he has not had his pain since medicare had "terminated" his insurance.

## 2014-05-25 ENCOUNTER — Encounter (HOSPITAL_COMMUNITY): Payer: Self-pay | Admitting: Emergency Medicine

## 2014-05-25 ENCOUNTER — Emergency Department (HOSPITAL_COMMUNITY)
Admission: EM | Admit: 2014-05-25 | Discharge: 2014-05-26 | Disposition: A | Discharge status: Home / Self Care | Payer: Medicare Other | Source: Home / Self Care | Attending: Emergency Medicine | Admitting: Emergency Medicine

## 2014-05-25 DIAGNOSIS — Z7901 Long term (current) use of anticoagulants: Secondary | ICD-10-CM | POA: Insufficient documentation

## 2014-05-25 DIAGNOSIS — Z87891 Personal history of nicotine dependence: Secondary | ICD-10-CM

## 2014-05-25 DIAGNOSIS — F1120 Opioid dependence, uncomplicated: Secondary | ICD-10-CM | POA: Diagnosis present

## 2014-05-25 DIAGNOSIS — Z86718 Personal history of other venous thrombosis and embolism: Secondary | ICD-10-CM

## 2014-05-25 DIAGNOSIS — D5700 Hb-SS disease with crisis, unspecified: Principal | ICD-10-CM | POA: Diagnosis present

## 2014-05-25 DIAGNOSIS — I10 Essential (primary) hypertension: Secondary | ICD-10-CM | POA: Diagnosis present

## 2014-05-25 DIAGNOSIS — Z8639 Personal history of other endocrine, nutritional and metabolic disease: Secondary | ICD-10-CM

## 2014-05-25 DIAGNOSIS — D638 Anemia in other chronic diseases classified elsewhere: Secondary | ICD-10-CM | POA: Diagnosis present

## 2014-05-25 DIAGNOSIS — Z86711 Personal history of pulmonary embolism: Secondary | ICD-10-CM

## 2014-05-25 DIAGNOSIS — Z9114 Patient's other noncompliance with medication regimen: Secondary | ICD-10-CM | POA: Insufficient documentation

## 2014-05-25 DIAGNOSIS — E876 Hypokalemia: Secondary | ICD-10-CM | POA: Diagnosis not present

## 2014-05-25 DIAGNOSIS — Z7722 Contact with and (suspected) exposure to environmental tobacco smoke (acute) (chronic): Secondary | ICD-10-CM | POA: Diagnosis present

## 2014-05-25 DIAGNOSIS — Z79899 Other long term (current) drug therapy: Secondary | ICD-10-CM

## 2014-05-25 DIAGNOSIS — Z96641 Presence of right artificial hip joint: Secondary | ICD-10-CM | POA: Diagnosis present

## 2014-05-25 DIAGNOSIS — I248 Other forms of acute ischemic heart disease: Secondary | ICD-10-CM | POA: Diagnosis present

## 2014-05-25 DIAGNOSIS — G8929 Other chronic pain: Secondary | ICD-10-CM | POA: Diagnosis present

## 2014-05-25 DIAGNOSIS — Z8619 Personal history of other infectious and parasitic diseases: Secondary | ICD-10-CM | POA: Insufficient documentation

## 2014-05-25 DIAGNOSIS — Z8739 Personal history of other diseases of the musculoskeletal system and connective tissue: Secondary | ICD-10-CM

## 2014-05-25 DIAGNOSIS — F1290 Cannabis use, unspecified, uncomplicated: Secondary | ICD-10-CM | POA: Diagnosis present

## 2014-05-25 DIAGNOSIS — Z9049 Acquired absence of other specified parts of digestive tract: Secondary | ICD-10-CM | POA: Diagnosis present

## 2014-05-25 DIAGNOSIS — D72829 Elevated white blood cell count, unspecified: Secondary | ICD-10-CM | POA: Diagnosis present

## 2014-05-25 DIAGNOSIS — F39 Unspecified mood [affective] disorder: Secondary | ICD-10-CM | POA: Diagnosis present

## 2014-05-25 NOTE — ED Notes (Signed)
Pt reports sickle cell pain on and off for a week. Pt reports pain in upper body and legs.

## 2014-05-26 ENCOUNTER — Observation Stay (HOSPITAL_BASED_OUTPATIENT_CLINIC_OR_DEPARTMENT_OTHER)
Admission: AD | Admit: 2014-05-26 | Discharge: 2014-05-26 | Disposition: A | Discharge status: Home / Self Care | Payer: Medicare Other | Source: Home / Self Care | Attending: Internal Medicine | Admitting: Internal Medicine

## 2014-05-26 ENCOUNTER — Ambulatory Visit: Payer: Self-pay | Admitting: Internal Medicine

## 2014-05-26 ENCOUNTER — Encounter (HOSPITAL_COMMUNITY): Payer: Self-pay

## 2014-05-26 DIAGNOSIS — Z96641 Presence of right artificial hip joint: Secondary | ICD-10-CM | POA: Insufficient documentation

## 2014-05-26 DIAGNOSIS — D5700 Hb-SS disease with crisis, unspecified: Secondary | ICD-10-CM

## 2014-05-26 DIAGNOSIS — I10 Essential (primary) hypertension: Secondary | ICD-10-CM

## 2014-05-26 DIAGNOSIS — Z833 Family history of diabetes mellitus: Secondary | ICD-10-CM | POA: Insufficient documentation

## 2014-05-26 DIAGNOSIS — Z87891 Personal history of nicotine dependence: Secondary | ICD-10-CM | POA: Insufficient documentation

## 2014-05-26 LAB — CBC WITH DIFFERENTIAL/PLATELET
Basophils Absolute: 0 10*3/uL (ref 0.0–0.1)
Basophils Relative: 0 % (ref 0–1)
Eosinophils Absolute: 0.3 10*3/uL (ref 0.0–0.7)
Eosinophils Relative: 2 % (ref 0–5)
HCT: 18.8 % — ABNORMAL LOW (ref 39.0–52.0)
HEMOGLOBIN: 6.5 g/dL — AB (ref 13.0–17.0)
LYMPHS ABS: 2.9 10*3/uL (ref 0.7–4.0)
LYMPHS PCT: 18 % (ref 12–46)
MCH: 33 pg (ref 26.0–34.0)
MCHC: 34.6 g/dL (ref 30.0–36.0)
MCV: 95.4 fL (ref 78.0–100.0)
MONO ABS: 1.9 10*3/uL — AB (ref 0.1–1.0)
Monocytes Relative: 12 % (ref 3–12)
Neutro Abs: 11.1 10*3/uL — ABNORMAL HIGH (ref 1.7–7.7)
Neutrophils Relative %: 68 % (ref 43–77)
PLATELETS: 366 10*3/uL (ref 150–400)
RBC: 1.97 MIL/uL — AB (ref 4.22–5.81)
RDW: 22.7 % — AB (ref 11.5–15.5)
WBC: 16.2 10*3/uL — ABNORMAL HIGH (ref 4.0–10.5)

## 2014-05-26 LAB — RETICULOCYTES
RBC.: 1.97 MIL/uL — AB (ref 4.22–5.81)
RETIC CT PCT: 8.6 % — AB (ref 0.4–3.1)
Retic Count, Absolute: 169.4 10*3/uL (ref 19.0–186.0)

## 2014-05-26 LAB — I-STAT CHEM 8, ED
BUN: 10 mg/dL (ref 6–23)
CALCIUM ION: 1.24 mmol/L — AB (ref 1.12–1.23)
Chloride: 107 mmol/L (ref 96–112)
Creatinine, Ser: 0.6 mg/dL (ref 0.50–1.35)
Glucose, Bld: 115 mg/dL — ABNORMAL HIGH (ref 70–99)
HCT: 21 % — ABNORMAL LOW (ref 39.0–52.0)
HEMOGLOBIN: 7.1 g/dL — AB (ref 13.0–17.0)
Potassium: 3.7 mmol/L (ref 3.5–5.1)
Sodium: 141 mmol/L (ref 135–145)
TCO2: 19 mmol/L (ref 0–100)

## 2014-05-26 LAB — SAMPLE TO BLOOD BANK

## 2014-05-26 MED ORDER — ONDANSETRON HCL 4 MG/2ML IJ SOLN: 4.0000 mg | Freq: Four times a day (QID) | INTRAMUSCULAR | Status: DC | PRN

## 2014-05-26 MED ORDER — HEPARIN SOD (PORK) LOCK FLUSH 100 UNIT/ML IV SOLN
500.0000 [IU] | INTRAVENOUS | Status: AC | PRN
  Administered 2014-05-26: 500 [IU]
  Filled 2014-05-26: qty 5

## 2014-05-26 MED ORDER — DIPHENHYDRAMINE HCL 12.5 MG/5ML PO ELIX
12.5000 mg | ORAL_SOLUTION | Freq: Four times a day (QID) | ORAL | Status: DC | PRN
  Administered 2014-05-26: 12.5 mg via ORAL
  Filled 2014-05-26: qty 5

## 2014-05-26 MED ORDER — HYDROMORPHONE HCL 2 MG/ML IJ SOLN
2.0000 mg | INTRAMUSCULAR | Status: AC | PRN
  Administered 2014-05-26 (×3): 2 mg via INTRAVENOUS
  Filled 2014-05-26 (×3): qty 1

## 2014-05-26 MED ORDER — HYDROMORPHONE 2 MG/ML HIGH CONCENTRATION IV PCA SOLN
INTRAVENOUS | Status: DC
  Administered 2014-05-26: 13.6 mg via INTRAVENOUS
  Administered 2014-05-26: 10:00:00 via INTRAVENOUS
  Administered 2014-05-26: 1.6 mg via INTRAVENOUS
  Filled 2014-05-26: qty 25

## 2014-05-26 MED ORDER — SODIUM CHLORIDE 0.9 % IJ SOLN: 9.0000 mL | INTRAMUSCULAR | Status: DC | PRN

## 2014-05-26 MED ORDER — SODIUM CHLORIDE 0.9 % IJ SOLN
10.0000 mL | INTRAMUSCULAR | Status: AC | PRN
  Administered 2014-05-26: 10 mL

## 2014-05-26 MED ORDER — HYDROMORPHONE HCL 2 MG/ML IJ SOLN
1.5000 mg | Freq: Once | INTRAMUSCULAR | Status: AC
  Administered 2014-05-26: 1.5 mg via INTRAVENOUS
  Filled 2014-05-26: qty 1

## 2014-05-26 MED ORDER — NALOXONE HCL 0.4 MG/ML IJ SOLN: 0.4000 mg | INTRAMUSCULAR | Status: DC | PRN

## 2014-05-26 MED ORDER — DIPHENHYDRAMINE HCL 50 MG/ML IJ SOLN
12.5000 mg | Freq: Four times a day (QID) | INTRAMUSCULAR | Status: DC | PRN
  Filled 2014-05-26 (×2): qty 0.25

## 2014-05-26 MED ORDER — HYDROMORPHONE HCL 4 MG PO TABS
4.0000 mg | ORAL_TABLET | Freq: Once | ORAL | Status: AC
Start: 2014-05-26 — End: 2014-05-26
  Administered 2014-05-26: 4 mg via ORAL
  Filled 2014-05-26: qty 1

## 2014-05-26 MED ORDER — CLOTRIMAZOLE 10 MG MT TROC: 10.0000 mg | Freq: Every day | OROMUCOSAL | Status: AC

## 2014-05-26 MED ORDER — KETOROLAC TROMETHAMINE 30 MG/ML IJ SOLN
30.0000 mg | Freq: Once | INTRAMUSCULAR | Status: AC
  Administered 2014-05-26: 30 mg via INTRAVENOUS
  Filled 2014-05-26: qty 1

## 2014-05-26 MED ORDER — DIPHENHYDRAMINE HCL 12.5 MG/5ML PO ELIX
25.0000 mg | ORAL_SOLUTION | Freq: Once | ORAL | Status: AC
  Administered 2014-05-26: 25 mg via ORAL
  Filled 2014-05-26: qty 10

## 2014-05-26 MED ORDER — DEXTROSE-NACL 5-0.45 % IV SOLN
INTRAVENOUS | Status: DC
  Administered 2014-05-26: 10:00:00 via INTRAVENOUS

## 2014-05-26 MED ORDER — KETOROLAC TROMETHAMINE 30 MG/ML IJ SOLN
30.0000 mg | Freq: Four times a day (QID) | INTRAMUSCULAR | Status: DC
  Administered 2014-05-26: 30 mg via INTRAVENOUS
  Filled 2014-05-26: qty 1

## 2014-05-26 NOTE — ED Notes (Addendum)
Pt given d/c paperwork and verbalized understanding.  NAD noted upon discharge.

## 2014-05-26 NOTE — Progress Notes (Signed)
Report received from [facility]. Patient will be transported to [facility].

## 2014-05-26 NOTE — Progress Notes (Addendum)
Patient pain at goal 4/10. Patient discharged home. Instructions reviewed and signed by patient. Patient alert, oriented and ambulatory at time of discharge.

## 2014-05-26 NOTE — ED Notes (Addendum)
[facility].  Staff made aware that chest port is saline locked.

## 2014-05-26 NOTE — Progress Notes (Signed)
Patient arrived at [facility]. MD notified.

## 2014-05-26 NOTE — ED Provider Notes (Signed)
CSN: [id number]     Arrival date & time 05/25/14  2223 History   First MD Initiated Contact with Patient 05/26/14 [phone]     Chief Complaint  Patient presents with  . Sickle Cell Pain Crisis     (Consider location/radiation/quality/duration/timing/severity/associated sxs/prior Treatment) HPI 34 year old male presents to emergency part with complaint of sickle cell crisis.  Patient was seen for same yesterday.  Patient reports that his Medicaid has been messed up, and he has been unable to fill his prescription of Dilaudid.  Patient was called in [place] on April 20, but he reports he has not filled this prescription.  Per the [place] data base, patient felt a prescription for 4 mg of Dilaudid on the third, 90 tablets.  Patient reports he felt okay in the emergency department is, today, the pain returned upon waking up this morning.  Patient was told at his ED visit yesterday that his counts were low, and he may need a blood transfusion.  Patient denies any or chills, nausea, vomiting, diarrhea.  Pain in arms and legs.  Patient reports that he has follow-up with Dr. Zigmund Daniel on Monday.   Past Medical History  Diagnosis Date  . Sickle cell anemia   . Blood transfusion   . Acute embolism and thrombosis of right internal jugular vein   . Hypokalemia   . Mood disorder   . History of pulmonary embolus (PE)   . Avascular necrosis   . Leukocytosis     Chronic  . Thrombocytosis     Chronic  . Hypertension   . History of Clostridium difficile infection   . Uses marijuana   . Chronic anticoagulation   . Functional asplenia   . Former smoker   . Second hand tobacco smoke exposure   . Alcohol consumption of one to four drinks per day   . Noncompliance with medication regimen   . Sickle-cell crisis with associated acute chest syndrome 05/13/2013  . Acute chest syndrome 06/18/2013  . Demand ischemia 01/02/2014   Past Surgical History  Procedure Laterality Date  . Right hip replacement      08/2006   . Cholecystectomy      01/2008  . Porta cath placement    . Porta cath removal    . Umbilical hernia repair      01/2008  . Excision of left periauricular cyst      10/2009  . Excision of right ear lobe cyst with primary closur      11/2007  . Portacath placement  01/05/2012    Procedure: INSERTION PORT-A-CATH;  Surgeon: Odis Hollingshead, MD;  Location: [place];  Service: General;  Laterality: N/A;  ultrasound guiced port a cath insertion with fluoroscopy   Family History  Problem Relation Age of Onset  . Sickle cell trait Mother   . Depression Mother   . Diabetes Mother   . Sickle cell trait Father   . Sickle cell trait Brother    History  Substance Use Topics  . Smoking status: Former Smoker -- 13 years    Quit date: 07/08/2010  . Smokeless tobacco: Never Used  . Alcohol Use: No    Review of Systems  See History of Present Illness; otherwise all other systems are reviewed and negative   Allergies  Review of patient's allergies indicates no known allergies.  Home Medications   Prior to Admission medications   Medication Sig Start Date End Date Taking? Authorizing Provider  Cholecalciferol (VITAMIN D) 2000 UNITS tablet  Take 1 tablet (2,000 Units total) by mouth daily. 02/13/14  Yes [name], MD  diltiazem (CARDIZEM CD) 120 MG 24 hr capsule Take 1 capsule (120 mg total) by mouth daily. 03/13/14  Yes Costin Karlyne Greenspan, MD  folic acid (FOLVITE) 1 MG tablet Take 1 tablet (1 mg total) by mouth every morning. 10/08/13  Yes [name], MD  HYDROmorphone (DILAUDID) 4 MG tablet Take 1 tablet (4 mg total) by mouth every 4 (four) hours as needed for severe pain. 05/02/14  Yes [name], MD  hydroxyurea (HYDREA) 500 MG capsule Take 3 capsules (1,500 mg total) by mouth daily. May take with food to minimize GI side effects. 10/30/13  Yes [name], MD  lisinopril (PRINIVIL,ZESTRIL) 10 MG tablet Take 1 tablet (10 mg total) by mouth daily. 01/22/14  Yes  [name], MD  metoprolol tartrate (LOPRESSOR) 25 MG tablet Take 1 tablet (25 mg total) by mouth daily. 08/22/13  Yes [name], MD  morphine (MS CONTIN) 30 MG 12 hr tablet Take 1 tablet (30 mg total) by mouth every 12 (twelve) hours. 05/21/14  Yes [name], MD  potassium chloride SA (K-DUR,KLOR-CON) 20 MEQ tablet Take 1 tablet (20 mEq total) by mouth every morning. 09/25/13  Yes [name], MD  rivaroxaban (XARELTO) 20 MG TABS tablet Take 1 tablet (20 mg total) by mouth every morning. 03/13/14  Yes Costin Karlyne Greenspan, MD  zolpidem (AMBIEN) 10 MG tablet Take 1 tablet (10 mg total) by mouth at bedtime as needed for sleep. 04/01/14  Yes [name], MD   BP 112/75 mmHg  Pulse 87  Temp(Src) 98.4 F (36.9 C) (Oral)  Resp 16  SpO2 91% Physical Exam  Constitutional: He is oriented to person, place, and time. He appears well-developed and well-nourished.  HENT:  Head: Normocephalic and atraumatic.  Nose: Nose normal.  Mouth/Throat: Oropharynx is clear and moist.  Eyes: Conjunctivae and EOM are normal. Pupils are equal, round, and reactive to light.  Neck: Normal range of motion. Neck supple. No JVD present. No tracheal deviation present. No thyromegaly present.  Cardiovascular: Normal rate, regular rhythm, normal heart sounds and intact distal pulses.  Exam reveals no gallop and no friction rub.   No murmur heard. Pulmonary/Chest: Effort normal and breath sounds normal. No stridor. No respiratory distress. He has no wheezes. He has no rales. He exhibits no tenderness.  Abdominal: Soft. Bowel sounds are normal. He exhibits no distension and no mass. There is no tenderness. There is no rebound and no guarding.  Musculoskeletal: Normal range of motion. He exhibits no edema or tenderness.  Lymphadenopathy:    He has no cervical adenopathy.  Neurological: He is alert and oriented to person, place, and time. He displays normal reflexes. He exhibits normal muscle  tone. Coordination normal.  Skin: Skin is warm and dry. No rash noted. No erythema. No pallor.  Psychiatric: He has a normal mood and affect. His behavior is normal. Judgment and thought content normal.  Nursing note and vitals reviewed.   ED Course  Procedures (including critical care time) Labs Review Labs Reviewed  RETICULOCYTES - Abnormal; Notable for the following:    Retic Ct Pct 8.6 (*)    RBC. 1.97 (*)    All other components within normal limits  CBC WITH DIFFERENTIAL/PLATELET - Abnormal; Notable for the following:    WBC 16.2 (*)    RBC 1.97 (*)    Hemoglobin 6.5 (*)  HCT 18.8 (*)    RDW 22.7 (*)    Neutro Abs 11.1 (*)    Monocytes Absolute 1.9 (*)    All other components within normal limits  I-STAT CHEM 8, ED - Abnormal; Notable for the following:    Glucose, Bld 115 (*)    Calcium, Ion 1.24 (*)    Hemoglobin 7.1 (*)    HCT 21.0 (*)    All other components within normal limits  SAMPLE TO BLOOD BANK    Imaging Review No results found.   EKG Interpretation None      MDM   Final diagnoses:  Sickle cell anemia with crisis    34 year old male, history of sickle cell disease who is been without his pain medications for several days now.  Patient's hemoglobin today is 6.5.  He has been trending down over the last month.  He is not tachycardic, hypoxic or dyspneic, patient does not wish to be admitted, but second visit in 2 days for sickle cell crisis.  Sickle cell clinic opens in the morning, and he prefers to get pain medicine here until the clinic opens and he can see Dr. Zigmund Daniel.  6:41 AM Patient receiving his third round of Dilaudid.  He reports some improvement in his pain.  We'll pass care to next team for either admission to the sickle cell clinic or to the hospital.   Linton Flemings, MD 05/26/14 [phone]

## 2014-05-26 NOTE — Discharge Summary (Signed)
Physician Discharge Summary  JAHKAI YANDELL SWF:[id number] DOB: 07/15/1980 DOA: 05/26/2014  PCP: [name],[name], MD  Admit date: 05/26/2014 Discharge date: 05/26/2014  Discharge Diagnoses:  Active Problems:   Sickle cell crisis   Discharge Condition: stable  Disposition: home     Follow-up Information    Follow up with [name],[name], MD In 1 week.   Specialty:  Internal Medicine   Contact information:   [place] [address] [phone]       Diet:regular  Wt Readings from Last 3 Encounters:  05/26/14 163 lb (73.936 kg)  05/07/14 167 lb 3.2 oz (75.841 kg)  04/30/14 164 lb (74.39 kg)    History of present illness:  [name] is a 34yo male with sickle cell disease who presents to the day hospital with pain in his right ribs that he rates an 8/10. He stated that the rib pain started yesterday. He tried taking his home Dilaudid but did not find much relief. Pt denies other symptoms such as cough, CP, and dyspnea. quit. He denies HA, dizziness, fever, SOB, weakness, vomiting, and diarrhea.   Hospital Course:  Pt was treated initially with weight based rapid re-dosing Dilaudid, IVF, and Toradol. His pain was at a 7/10, so he was then transitioned to a weight based Dilaudid PCA . Labs were checked previously in the ED and his Hgb was around his baseline of 7.1. Patient was re-evaluated and appeared improved and rated his pain as a 4/10. Thus he was discharged home, to continue home pain management. He was also noted to have thrush on exam. A prescription for Clotrimazole troche tabs were sent to his pharmacy. Also, pt has insurance issues where he has not been able to obtain his Dilaudid prescription. Pt spoke with pharmacy while here, in which a half prescription could be given for $47 but they will not fill the other half without a new prescription. Pt states he will pay for a half prescription. He was then advised to make an appointment  with his PCP for 1 week as he missed his appointment that was scheduled for today. This way he can be seen and obtain another prescription.  Discharge Exam:  Filed Vitals:   05/26/14 1548  BP:   Pulse: 92  Temp:   Resp: 16   Filed Vitals:   05/26/14 1300 05/26/14 1405 05/26/14 1548 05/26/14 1550  BP: 110/57 118/82    Pulse: 87 107 92   Temp: 98.6 F (37 C) 98.8 F (37.1 C)    TempSrc: Oral Oral    Resp: 20 20 16    Height:      Weight:      SpO2: 95% 91% 88% 93%   General: Alert, awake, oriented x3, in no acute distress.  HEENT: [place]/AT PEERL, EOMI Neck: Trachea midline,  no masses, no lymphadenopathy. White plaques in the fissures of tongue and buccal mucosa OROPHARYNX:  Moist, No exudate/ erythema/lesions.  Heart: Regular rate and rhythm, without murmurs, rubs, gallops Lungs: Clear to auscultation, no wheezing or rhonchi noted.  Abdomen: Soft, nontender, nondistended, positive bowel sounds, no masses no hepatosplenomegaly noted.  Neuro: No focal neurological deficits noted cranial nerves II through XII grossly intact.  Strength 5 out of 5 in bilateral upper and lower extremities. Nl gait Musculoskeletal: No warm swelling or erythema around joints, no spinal tenderness noted. Extremities: +clubbing of fingers, no cyanosis or edema.   Discharge Instructions As above    Medication List  TAKE these medications        clotrimazole 10 MG troche  Commonly known as:  MYCELEX  Take 1 tablet (10 mg total) by mouth 5 (five) times daily.     diltiazem 120 MG 24 hr capsule  Commonly known as:  CARDIZEM CD  Take 1 capsule (120 mg total) by mouth daily.     folic acid 1 MG tablet  Commonly known as:  FOLVITE  Take 1 tablet (1 mg total) by mouth every morning.     HYDROmorphone 4 MG tablet  Commonly known as:  DILAUDID  Take 1 tablet (4 mg total) by mouth every 4 (four) hours as needed for severe pain.     hydroxyurea 500 MG capsule  Commonly known as:  HYDREA  Take 3  capsules (1,500 mg total) by mouth daily. May take with food to minimize GI side effects.     lisinopril 10 MG tablet  Commonly known as:  PRINIVIL,ZESTRIL  Take 1 tablet (10 mg total) by mouth daily.     metoprolol tartrate 25 MG tablet  Commonly known as:  LOPRESSOR  Take 1 tablet (25 mg total) by mouth daily.     morphine 30 MG 12 hr tablet  Commonly known as:  MS CONTIN  Take 1 tablet (30 mg total) by mouth every 12 (twelve) hours.     potassium chloride SA 20 MEQ tablet  Commonly known as:  K-DUR,KLOR-CON  Take 1 tablet (20 mEq total) by mouth every morning.     rivaroxaban 20 MG Tabs tablet  Commonly known as:  XARELTO  Take 1 tablet (20 mg total) by mouth every morning.     Vitamin D 2000 UNITS tablet  Take 1 tablet (2,000 Units total) by mouth daily.     zolpidem 10 MG tablet  Commonly known as:  AMBIEN  Take 1 tablet (10 mg total) by mouth at bedtime as needed for sleep.          The results of significant diagnostics from this hospitalization (including imaging, microbiology, ancillary and laboratory) are listed below for reference.    Significant Diagnostic Studies: Dg Chest 2 View  05/22/2014   CLINICAL DATA:  Dry cough for 2 days.  EXAM: CHEST  2 VIEW  COMPARISON:  04/29/2014  FINDINGS: Left subclavian porta catheter, tip at the upper cavoatrial junction.  Stable mild cardiomegaly.  Stable aortic and hilar contours.  Scattered interstitial opacity consistent with mild scarring. There is no edema, consolidation, effusion, or pneumothorax.  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  IMPRESSION: 1. No active cardiopulmonary disease. 2. Cardiomegaly and mild lung scarring.   Electronically Signed   By: Monte Fantasia M.D.   On: 05/22/2014 04:44   Dg Chest 2 View  04/29/2014   CLINICAL DATA:  Sickle cell crisis.  Pain.  EXAM: CHEST  2 VIEW  COMPARISON:  04/24/2014 .  FINDINGS: Power Port catheter noted with lead tip projected over the cavoatrial  junction. Mediastinum and hilar structures normal. Cardiomegaly. Stable bilateral pleural parenchymal scarring, no change. No acute infiltrate. No pleural effusion or pneumothorax. Surgical clips right upper quadrant. No acute bony abnormality.  IMPRESSION: 1. Stable changes of pleural parenchymal scarring. No acute infiltrate. 2. Stable cardiomegaly. 3. Power port catheter in stable position.   Electronically Signed   By: Marcello Moores  Register   On: 04/29/2014 08:29    Microbiology: No results found for this or any previous visit (from the past 240 hour(s)).   Labs:  Basic Metabolic Panel:  Recent Labs Lab 05/22/14 0130 05/24/14 1829 05/26/14 0222  NA 132* 138 141  K 3.5 3.8 3.7  CL 108 106 107  CO2 23 23  --   GLUCOSE 109* 110* 115*  BUN 12 14 10   CREATININE 0.56 0.65 0.60  CALCIUM 8.4 8.9  --    Liver Function Tests:  Recent Labs Lab 05/22/14 0130 05/24/14 1829  AST 34 41*  ALT 21 27  ALKPHOS 83 70  BILITOT 5.3* 5.0*  PROT 7.2 8.2  ALBUMIN 4.0 4.5   No results for input(s): LIPASE, AMYLASE in the last 168 hours. No results for input(s): AMMONIA in the last 168 hours. CBC:  Recent Labs Lab 05/22/14 0130 05/24/14 1829 05/26/14 0210 05/26/14 0222  WBC 16.8* 15.3* 16.2*  --   NEUTROABS 11.3* 11.5* 11.1*  --   HGB 6.9* 6.9* 6.5* 7.1*  HCT 19.9* 19.9* 18.8* 21.0*  MCV 99.0 94.8 95.4  --   PLT 376 388 366  --     Time total:>55 min  Signed:  Kalman Shan  05/26/2014, 4:19 PM

## 2014-05-26 NOTE — ED Provider Notes (Signed)
Patient signed out by Dr. Sharol Given. Sickle cell crisis. Noted to have slightly lower hemoglobin than baseline. Has previously declined transfusion. Plan for transfer to sickle cell clinic later today to address patient's pain and difficulty obtaining outpatient medications. Discussed with Dr. Alben Deeds who accepts the patient to the [facility].  Merryl Hacker, MD 05/26/14 [id number]

## 2014-05-26 NOTE — H&P (Addendum)
Marine History and Physical  [name] KXF:[id number] DOB: [birth year] DOA: 05/26/2014   PCP: [name],[name], MD   Chief Complaint: pain  IRC:VELFYBO Honsinger is a 34yo male with sickle cell disease who presents to the day hospital with pain in his right ribs, left shoulder and, legs that he rates a 7/10. Pt initially went to the ED last night complaining of similar pain that was more severe at a 9/10. He was treated with IV bolus Dilaudid and got some relief but pain was not at a level where he was comfortable going home with. In addition, pt has not had his pain medications, due to insurance issues. He took his prescription to the pharmacy but his insurance wouldn't cover it. He states that he feels some DOE with walking. Pt denies other symptoms such as cough, CP, and dyspnea. quit. He denies HA, dizziness, fever, dysuria, weakness, vomiting, and diarrhea.   Review of Systems:  Constitutional: No weight loss, night sweats, fatigue. No fevers or chills HEENT: No headaches, dizziness, seizures, vision changes, difficulty swallowing,Tooth/dental problems,Sore throat, No sneezing, itching, ear ache, nasal congestion, post nasal drip,  Cardio-vascular: No chest pain, Orthopnea, PND, swelling in lower extremities, anasarca, dizziness, palpitations  GI: No heartburn, indigestion, abdominal pain, nausea, vomiting, diarrhea, change in bowel habits, loss of appetite  Resp: No shortness of breath with exertion or at rest. No excess mucus, no productive cough. No coughing up of blood.No change in color of mucus.No wheezing.No chest wall deformity  Skin: no rash or lesions.  GU: no dysuria, change in color of urine, no urgency or frequency. No flank pain.  Musculoskeletal: No joint pain or swelling. No decreased range of motion. No back pain. +rib, leg, and shoulder pain Psych: No change in mood or affect. No depression or anxiety. No memory loss.    Past Medical  History  Diagnosis Date  . Sickle cell anemia   . Blood transfusion   . Acute embolism and thrombosis of right internal jugular vein   . Hypokalemia   . Mood disorder   . History of pulmonary embolus (PE)   . Avascular necrosis   . Leukocytosis     Chronic  . Thrombocytosis     Chronic  . Hypertension   . History of Clostridium difficile infection   . Uses marijuana   . Chronic anticoagulation   . Functional asplenia   . Former smoker   . Second hand tobacco smoke exposure   . Alcohol consumption of one to four drinks per day   . Noncompliance with medication regimen   . Sickle-cell crisis with associated acute chest syndrome 05/13/2013  . Acute chest syndrome 06/18/2013  . Demand ischemia 01/02/2014   Past Surgical History  Procedure Laterality Date  . Right hip replacement      08/2006  . Cholecystectomy      01/2008  . Porta cath placement    . Porta cath removal    . Umbilical hernia repair      01/2008  . Excision of left periauricular cyst      10/2009  . Excision of right ear lobe cyst with primary closur      11/2007  . Portacath placement  01/05/2012    Procedure: INSERTION PORT-A-CATH;  Surgeon: Odis Hollingshead, MD;  Location: [place];  Service: General;  Laterality: N/A;  ultrasound guiced port a cath insertion with fluoroscopy   Social History:  reports that he quit smoking about 3  years ago. He has never used smokeless tobacco. He reports that he uses illicit drugs (Marijuana) about twice per week. He reports that he does not drink alcohol.  No Known Allergies  Family History  Problem Relation Age of Onset  . Sickle cell trait Mother   . Depression Mother   . Diabetes Mother   . Sickle cell trait Father   . Sickle cell trait Brother    No current facility-administered medications on file prior to encounter.   Current Outpatient Prescriptions on File Prior to Encounter  Medication Sig Dispense Refill  . Cholecalciferol (VITAMIN D) 2000 UNITS tablet  Take 1 tablet (2,000 Units total) by mouth daily. 90 tablet 3  . diltiazem (CARDIZEM CD) 120 MG 24 hr capsule Take 1 capsule (120 mg total) by mouth daily. 30 capsule 1  . folic acid (FOLVITE) 1 MG tablet Take 1 tablet (1 mg total) by mouth every morning. 30 tablet 11  . HYDROmorphone (DILAUDID) 4 MG tablet Take 1 tablet (4 mg total) by mouth every 4 (four) hours as needed for severe pain. 90 tablet 0  . hydroxyurea (HYDREA) 500 MG capsule Take 3 capsules (1,500 mg total) by mouth daily. May take with food to minimize GI side effects. 60 capsule 3  . lisinopril (PRINIVIL,ZESTRIL) 10 MG tablet Take 1 tablet (10 mg total) by mouth daily. 30 tablet 1  . metoprolol tartrate (LOPRESSOR) 25 MG tablet Take 1 tablet (25 mg total) by mouth daily. 30 tablet 6  . morphine (MS CONTIN) 30 MG 12 hr tablet Take 1 tablet (30 mg total) by mouth every 12 (twelve) hours. 60 tablet 0  . potassium chloride SA (K-DUR,KLOR-CON) 20 MEQ tablet Take 1 tablet (20 mEq total) by mouth every morning. 30 tablet 3  . rivaroxaban (XARELTO) 20 MG TABS tablet Take 1 tablet (20 mg total) by mouth every morning. 30 tablet 2  . zolpidem (AMBIEN) 10 MG tablet Take 1 tablet (10 mg total) by mouth at bedtime as needed for sleep. 30 tablet 3      Physical Exam: Filed Vitals:   05/26/14 0940  BP: 112/78  Pulse: 77  Temp: 98.6 F (37 C)  Resp: 18  Height: 6' (1.829 m)  Weight: 163 lb (73.936 kg)  SpO2: 95%   General: Alert, awake, oriented x3, in no acute distress.  HEENT: [place]/AT PEERL, EOMI, +white plaques in the linear fissures of his tongue and buccal mucosa Neck: Trachea midline,  no masses, no thyromegal,y no JVD, no carotid bruit OROPHARYNX:  Moist, No exudate/ erythema/lesions.  Heart: Regular rate and rhythm, without murmurs, rubs, gallops, PMI non-displaced, no heaves or thrills on palpation.  Lungs: Clear to auscultation, no wheezing or rhonchi noted. No increased vocal fremitus resonant to percussion  Abdomen: Soft,  nontender, nondistended, positive bowel sounds, no masses no hepatosplenomegaly noted..  Neuro: No focal neurological deficits noted cranial nerves II through XII grossly intact. Nl baseline gait MSK: no warmth, swelling, or erythema Extremities: mild clubbing of fingers, no cyanosis, or edema.   Labs on Admission:   Basic Metabolic Panel:  Recent Labs Lab 05/22/14 0130 05/24/14 1829 05/26/14 0222  NA 132* 138 141  K 3.5 3.8 3.7  CL 108 106 107  CO2 23 23  --   GLUCOSE 109* 110* 115*  BUN 12 14 10   CREATININE 0.56 0.65 0.60  CALCIUM 8.4 8.9  --    Liver Function Tests:  Recent Labs Lab 05/22/14 0130 05/24/14 1829  AST 34 41*  ALT 21 27  ALKPHOS 83 70  BILITOT 5.3* 5.0*  PROT 7.2 8.2  ALBUMIN 4.0 4.5   CBC:  Recent Labs Lab 05/22/14 0130 05/24/14 1829 05/26/14 0210 05/26/14 0222  WBC 16.8* 15.3* 16.2*  --   NEUTROABS 11.3* 11.5* 11.1*  --   HGB 6.9* 6.9* 6.5* 7.1*  HCT 19.9* 19.9* 18.8* 21.0*  MCV 99.0 94.8 95.4  --   PLT 376 388 366  --    BNP: Invalid input(s): POCBNP   Radiological Exams on Admission: No results found.   Assessment/Plan: Active Problems:   Sickle cell crisis Pt will be treated with initially treated with weight based rapid re-dosing Dilaudid, IVF and Toradol . If pain is above 7/10 then Pt will be transitioned to a weight based Dilaudid PCA . Patient will be re-evaluated for pain in the context of function and relationship to baseline as care progresses.  Time spend: 40 min Code Status: full Family Communication:  none Disposition Plan: pending improvement  Kalman Shan, MD  Pager [phone]  If 7PM-7AM, please contact night-coverage [email or web address] Password [facility] 05/26/2014, 9:43 AM

## 2014-05-26 NOTE — Discharge Instructions (Signed)
Go immediately to Sickle Cell Clinic.

## 2014-05-27 ENCOUNTER — Encounter (HOSPITAL_COMMUNITY): Payer: Self-pay

## 2014-05-27 ENCOUNTER — Inpatient Hospital Stay (HOSPITAL_COMMUNITY)
Admission: EM | Admit: 2014-05-27 | Discharge: 2014-05-30 | DRG: 812 | Disposition: A | Discharge status: Home / Self Care | Payer: Medicare Other | Attending: Internal Medicine | Admitting: Internal Medicine

## 2014-05-27 DIAGNOSIS — I10 Essential (primary) hypertension: Secondary | ICD-10-CM | POA: Diagnosis present

## 2014-05-27 DIAGNOSIS — D5700 Hb-SS disease with crisis, unspecified: Secondary | ICD-10-CM | POA: Diagnosis present

## 2014-05-27 DIAGNOSIS — R05 Cough: Secondary | ICD-10-CM

## 2014-05-27 DIAGNOSIS — D649 Anemia, unspecified: Secondary | ICD-10-CM

## 2014-05-27 DIAGNOSIS — R0789 Other chest pain: Secondary | ICD-10-CM

## 2014-05-27 DIAGNOSIS — R058 Other specified cough: Secondary | ICD-10-CM

## 2014-05-27 LAB — COMPREHENSIVE METABOLIC PANEL
ALK PHOS: 74 U/L (ref 39–117)
ALT: 24 U/L (ref 0–53)
AST: 43 U/L — ABNORMAL HIGH (ref 0–37)
Albumin: 4.1 g/dL (ref 3.5–5.2)
Anion gap: 8 (ref 5–15)
BILIRUBIN TOTAL: 4.7 mg/dL — AB (ref 0.3–1.2)
BUN: 5 mg/dL — ABNORMAL LOW (ref 6–23)
CO2: 21 mmol/L (ref 19–32)
CREATININE: 0.64 mg/dL (ref 0.50–1.35)
Calcium: 9 mg/dL (ref 8.4–10.5)
Chloride: 107 mmol/L (ref 96–112)
GFR calc Af Amer: >90 mL/min (ref >90)
GFR calc non Af Amer: >90 mL/min (ref >90)
GLUCOSE: 118 mg/dL — AB (ref 70–99)
Potassium: 3 mmol/L — ABNORMAL LOW (ref 3.5–5.1)
Sodium: 136 mmol/L (ref 135–145)
Total Protein: 7.5 g/dL (ref 6.0–8.3)

## 2014-05-27 LAB — RETICULOCYTES
RBC.: 2.09 MIL/uL — AB (ref 4.22–5.81)
Retic Count, Absolute: 420.1 10*3/uL — ABNORMAL HIGH (ref 19.0–186.0)
Retic Ct Pct: 20.1 % — ABNORMAL HIGH (ref 0.4–3.1)

## 2014-05-27 MED ORDER — SODIUM CHLORIDE 0.9 % IV SOLN
INTRAVENOUS | Status: DC
  Administered 2014-05-27: 21:00:00 via INTRAVENOUS

## 2014-05-27 MED ORDER — HYDROMORPHONE HCL 2 MG/ML IJ SOLN
2.0000 mg | INTRAMUSCULAR | Status: AC | PRN
  Administered 2014-05-27 – 2014-05-28 (×4): 2 mg via INTRAVENOUS
  Filled 2014-05-27 (×4): qty 1

## 2014-05-27 MED ORDER — POTASSIUM CHLORIDE CRYS ER 20 MEQ PO TBCR
40.0000 meq | EXTENDED_RELEASE_TABLET | Freq: Once | ORAL | Status: AC
  Administered 2014-05-27: 40 meq via ORAL
  Filled 2014-05-27: qty 2

## 2014-05-27 MED ORDER — DIPHENHYDRAMINE HCL 50 MG/ML IJ SOLN
12.5000 mg | INTRAMUSCULAR | Status: AC | PRN
  Administered 2014-05-27 (×2): 12.5 mg via INTRAVENOUS
  Filled 2014-05-27 (×2): qty 1

## 2014-05-27 MED ORDER — ONDANSETRON HCL 4 MG/2ML IJ SOLN
4.0000 mg | Freq: Once | INTRAMUSCULAR | Status: AC
  Administered 2014-05-27: 4 mg via INTRAVENOUS
  Filled 2014-05-27: qty 2

## 2014-05-27 NOTE — ED Notes (Signed)
Patient ate 100% sandwich, sitting up in bed speaking on cell phone, watching TV, in NAD. Will continue to monitor.

## 2014-05-27 NOTE — ED Provider Notes (Signed)
CSN: [id number]     Arrival date & time 05/27/14  1744 History   First MD Initiated Contact with Patient 05/27/14 2014     Chief Complaint  Patient presents with  . Sickle Cell Pain Crisis  . Ribcage pain      (Consider location/radiation/quality/duration/timing/severity/associated sxs/prior Treatment) HPI   [name] is a 34 y.o. male who presents for evaluation of his usual sickle cell crisis pain. He describes the pain as being in the bilateral chest wall, left shoulder and midline upper back. He relates the pain as being caused because he was out of his chronic pain medications, for 1 week, until today. He was able to fill the MS Contin prescription, but not his usual Dilaudid. When asked if he had had a sickle cell crisis. Recently, he stated that "no its been about 3 weeks". However, the patient now is on his sixth visit to the ED over the last 3 weeks. Patient has known chronic pain, and frequently visits emergency department. He denies fever, chills, nausea, vomiting, cough, shortness of breath or chest pain. There are no other known modifying factors.   Past Medical History  Diagnosis Date  . Sickle cell anemia   . Blood transfusion   . Acute embolism and thrombosis of right internal jugular vein   . Hypokalemia   . Mood disorder   . History of pulmonary embolus (PE)   . Avascular necrosis   . Leukocytosis     Chronic  . Thrombocytosis     Chronic  . Hypertension   . History of Clostridium difficile infection   . Uses marijuana   . Chronic anticoagulation   . Functional asplenia   . Former smoker   . Second hand tobacco smoke exposure   . Alcohol consumption of one to four drinks per day   . Noncompliance with medication regimen   . Sickle-cell crisis with associated acute chest syndrome 05/13/2013  . Acute chest syndrome 06/18/2013  . Demand ischemia 01/02/2014   Past Surgical History  Procedure Laterality Date  . Right hip replacement      08/2006  .  Cholecystectomy      01/2008  . Porta cath placement    . Porta cath removal    . Umbilical hernia repair      01/2008  . Excision of left periauricular cyst      10/2009  . Excision of right ear lobe cyst with primary closur      11/2007  . Portacath placement  01/05/2012    Procedure: INSERTION PORT-A-CATH;  Surgeon: Odis Hollingshead, MD;  Location: [place];  Service: General;  Laterality: N/A;  ultrasound guiced port a cath insertion with fluoroscopy   Family History  Problem Relation Age of Onset  . Sickle cell trait Mother   . Depression Mother   . Diabetes Mother   . Sickle cell trait Father   . Sickle cell trait Brother    History  Substance Use Topics  . Smoking status: Former Smoker -- 13 years    Quit date: 07/08/2010  . Smokeless tobacco: Never Used  . Alcohol Use: No    Review of Systems  All other systems reviewed and are negative.     Allergies  Review of patient's allergies indicates no known allergies.  Home Medications   Prior to Admission medications   Medication Sig Start Date End Date Taking? Authorizing Provider  Cholecalciferol (VITAMIN D) 2000 UNITS tablet Take 1 tablet (2,000 Units total)  by mouth daily. 02/13/14  Yes [name], MD  diltiazem (CARDIZEM CD) 120 MG 24 hr capsule Take 1 capsule (120 mg total) by mouth daily. 03/13/14  Yes Costin Karlyne Greenspan, MD  folic acid (FOLVITE) 1 MG tablet Take 1 tablet (1 mg total) by mouth every morning. 10/08/13  Yes [name], MD  HYDROmorphone (DILAUDID) 4 MG tablet Take 1 tablet (4 mg total) by mouth every 4 (four) hours as needed for severe pain. 05/02/14  Yes [name], MD  hydroxyurea (HYDREA) 500 MG capsule Take 3 capsules (1,500 mg total) by mouth daily. May take with food to minimize GI side effects. 10/30/13  Yes [name], MD  lisinopril (PRINIVIL,ZESTRIL) 10 MG tablet Take 1 tablet (10 mg total) by mouth daily. 01/22/14  Yes [name], MD  metoprolol  tartrate (LOPRESSOR) 25 MG tablet Take 1 tablet (25 mg total) by mouth daily. 08/22/13  Yes [name], MD  morphine (MS CONTIN) 30 MG 12 hr tablet Take 1 tablet (30 mg total) by mouth every 12 (twelve) hours. 05/21/14  Yes [name], MD  potassium chloride SA (K-DUR,KLOR-CON) 20 MEQ tablet Take 1 tablet (20 mEq total) by mouth every morning. 09/25/13  Yes [name], MD  rivaroxaban (XARELTO) 20 MG TABS tablet Take 1 tablet (20 mg total) by mouth every morning. 03/13/14  Yes Costin Karlyne Greenspan, MD  zolpidem (AMBIEN) 10 MG tablet Take 1 tablet (10 mg total) by mouth at bedtime as needed for sleep. 04/01/14  Yes [name], MD  clotrimazole (MYCELEX) 10 MG troche Take 1 tablet (10 mg total) by mouth 5 (five) times daily. Patient not taking: Reported on 05/27/2014 05/26/14 06/04/14  Olabunmi A Agboola, MD   BP 125/90 mmHg  Pulse 103  Temp(Src) 99.4 F (37.4 C) (Oral)  Resp 18  SpO2 82% Physical Exam  Constitutional: He is oriented to person, place, and time. He appears well-developed and well-nourished. He appears distressed (He is in no apparent distress).  HENT:  Head: Normocephalic and atraumatic.  Right Ear: External ear normal.  Left Ear: External ear normal.  Eyes: Conjunctivae and EOM are normal. Pupils are equal, round, and reactive to light.  Neck: Normal range of motion and phonation normal. Neck supple.  Cardiovascular: Normal rate, regular rhythm and normal heart sounds.   Port-A-Cath site left upper chest wall, appears normal.  Pulmonary/Chest: Effort normal and breath sounds normal. He exhibits no bony tenderness.  Abdominal: Soft. There is no tenderness.  Musculoskeletal: Normal range of motion. He exhibits no tenderness.  Normal range of motion, arms and legs bilaterally  Neurological: He is alert and oriented to person, place, and time. No cranial nerve deficit or sensory deficit. He exhibits normal muscle tone. Coordination normal.  Skin: Skin is  warm, dry and intact.  Psychiatric: He has a normal mood and affect. His behavior is normal. Judgment and thought content normal.  Nursing note and vitals reviewed.   ED Course  Procedures (including critical care time)  Medications  HYDROmorphone (DILAUDID) injection 2 mg (2 mg Intravenous Given 05/27/14 2206)  diphenhydrAMINE (BENADRYL) injection 12.5 mg (12.5 mg Intravenous Given 05/27/14 2113)  0.9 %  sodium chloride infusion ( Intravenous New Bag/Given 05/27/14 2112)  potassium chloride SA (K-DUR,KLOR-CON) CR tablet 40 mEq (not administered)  ondansetron (ZOFRAN) injection 4 mg (4 mg Intravenous Given 05/27/14 2113)    Patient Vitals for the past 24 hrs:  BP Temp Temp src Pulse Resp  SpO2  05/27/14 2200 125/90 mmHg - - 103 18 (!) 82 %  05/27/14 2112 115/69 mmHg - - 89 16 97 %  05/27/14 2030 113/75 mmHg - - 86 22 96 %  05/27/14 1804 113/72 mmHg 99.4 F (37.4 C) Oral 73 18 93 %    11:07 PM Reevaluation with update and discussion. After initial assessment and treatment, an updated evaluation reveals no improvement after 2 doses of hydromorphone. Third dose ordered. Shley Dolby L   23:58- still uncomfortable after third dose of analgesia. He would like to be admitted.  12:00 AM-Consult complete with Dr. Humphrey Rolls. Patient case explained and discussed. He agrees to admit patient for further evaluation and treatment. Call ended at [place] Reviewed  CBC WITH DIFFERENTIAL/PLATELET - Abnormal; Notable for the following:    WBC 21.2 (*)    RBC 2.09 (*)    Hemoglobin 7.2 (*)    HCT 20.5 (*)    MCH 34.4 (*)    RDW 25.2 (*)    Platelets 415 (*)    Monocytes Relative 13 (*)    Neutro Abs 14.6 (*)    Monocytes Absolute 2.8 (*)    All other components within normal limits  COMPREHENSIVE METABOLIC PANEL - Abnormal; Notable for the following:    Potassium 3.0 (*)    Glucose, Bld 118 (*)    BUN 5 (*)    AST 43 (*)    Total Bilirubin 4.7 (*)    All other components within  normal limits  RETICULOCYTES - Abnormal; Notable for the following:    Retic Ct Pct 20.1 (*)    RBC. 2.09 (*)    Retic Count, Manual 420.1 (*)    All other components within normal limits    Imaging Review No results found.   EKG Interpretation None      MDM   Final diagnoses:  Vaso-occlusive sickle cell crisis  Anemia, unspecified anemia type      Chronic pain with sickle cell anemia and recurrent crises. Today, hemoglobin is stable, but reticulocyte count is markedly elevated. No clear evidence for causative factor. Patient reports that he is having trouble getting a lot of his home medications at this time.  Nursing Notes Reviewed/ Care Coordinated, and agree without changes. Applicable Imaging Reviewed.  Interpretation of Laboratory Data incorporated into ED treatment    Daleen Bo, MD 05/28/14 [phone]

## 2014-05-27 NOTE — ED Notes (Signed)
I attempted to collect patient labs and was unsuccessful.  I made nurse aware.

## 2014-05-27 NOTE — ED Notes (Signed)
Pt c/o Sickle Cell pain in shoulders and ribcage x several days.  Pain score 9/10.  Pt has been seen several times recently for same.  Pt reports that he has been unable to get medications filled, due to insurance issues.  Sts "I can't afford them at full price."

## 2014-05-28 ENCOUNTER — Inpatient Hospital Stay (HOSPITAL_COMMUNITY): Payer: Medicare Other

## 2014-05-28 DIAGNOSIS — E876 Hypokalemia: Secondary | ICD-10-CM | POA: Diagnosis not present

## 2014-05-28 DIAGNOSIS — Z96641 Presence of right artificial hip joint: Secondary | ICD-10-CM | POA: Diagnosis present

## 2014-05-28 DIAGNOSIS — Z87891 Personal history of nicotine dependence: Secondary | ICD-10-CM | POA: Diagnosis not present

## 2014-05-28 DIAGNOSIS — Z7722 Contact with and (suspected) exposure to environmental tobacco smoke (acute) (chronic): Secondary | ICD-10-CM | POA: Diagnosis present

## 2014-05-28 DIAGNOSIS — Z9049 Acquired absence of other specified parts of digestive tract: Secondary | ICD-10-CM | POA: Diagnosis present

## 2014-05-28 DIAGNOSIS — Z9114 Patient's other noncompliance with medication regimen: Secondary | ICD-10-CM | POA: Diagnosis present

## 2014-05-28 DIAGNOSIS — Z86718 Personal history of other venous thrombosis and embolism: Secondary | ICD-10-CM | POA: Diagnosis not present

## 2014-05-28 DIAGNOSIS — F39 Unspecified mood [affective] disorder: Secondary | ICD-10-CM | POA: Diagnosis present

## 2014-05-28 DIAGNOSIS — G8929 Other chronic pain: Secondary | ICD-10-CM | POA: Diagnosis present

## 2014-05-28 DIAGNOSIS — D72829 Elevated white blood cell count, unspecified: Secondary | ICD-10-CM | POA: Diagnosis present

## 2014-05-28 DIAGNOSIS — I10 Essential (primary) hypertension: Secondary | ICD-10-CM

## 2014-05-28 DIAGNOSIS — Z86711 Personal history of pulmonary embolism: Secondary | ICD-10-CM | POA: Diagnosis not present

## 2014-05-28 DIAGNOSIS — F1290 Cannabis use, unspecified, uncomplicated: Secondary | ICD-10-CM | POA: Diagnosis present

## 2014-05-28 DIAGNOSIS — D638 Anemia in other chronic diseases classified elsewhere: Secondary | ICD-10-CM | POA: Diagnosis present

## 2014-05-28 DIAGNOSIS — I248 Other forms of acute ischemic heart disease: Secondary | ICD-10-CM | POA: Diagnosis present

## 2014-05-28 DIAGNOSIS — D5700 Hb-SS disease with crisis, unspecified: Secondary | ICD-10-CM | POA: Diagnosis not present

## 2014-05-28 DIAGNOSIS — Z7901 Long term (current) use of anticoagulants: Secondary | ICD-10-CM | POA: Diagnosis not present

## 2014-05-28 DIAGNOSIS — R05 Cough: Secondary | ICD-10-CM | POA: Diagnosis not present

## 2014-05-28 DIAGNOSIS — F1120 Opioid dependence, uncomplicated: Secondary | ICD-10-CM | POA: Diagnosis present

## 2014-05-28 LAB — MAGNESIUM: MAGNESIUM: 1.5 mg/dL (ref 1.5–2.5)

## 2014-05-28 LAB — COMPREHENSIVE METABOLIC PANEL
ALK PHOS: 73 U/L (ref 39–117)
ALT: 23 U/L (ref 0–53)
ANION GAP: 8 (ref 5–15)
AST: 40 U/L — ABNORMAL HIGH (ref 0–37)
Albumin: 4.3 g/dL (ref 3.5–5.2)
BILIRUBIN TOTAL: 5.2 mg/dL — AB (ref 0.3–1.2)
BUN: 7 mg/dL (ref 6–23)
CHLORIDE: 106 mmol/L (ref 96–112)
CO2: 21 mmol/L (ref 19–32)
CREATININE: 0.69 mg/dL (ref 0.50–1.35)
Calcium: 8.7 mg/dL (ref 8.4–10.5)
GLUCOSE: 133 mg/dL — AB (ref 70–99)
Potassium: 3.3 mmol/L — ABNORMAL LOW (ref 3.5–5.1)
SODIUM: 135 mmol/L (ref 135–145)
Total Protein: 7.8 g/dL (ref 6.0–8.3)

## 2014-05-28 LAB — CBC WITH DIFFERENTIAL/PLATELET
BASOS ABS: 0 10*3/uL (ref 0.0–0.1)
BASOS PCT: 0 % (ref 0–1)
Basophils Absolute: 0 10*3/uL (ref 0.0–0.1)
Basophils Relative: 0 % (ref 0–1)
EOS ABS: 0.2 10*3/uL (ref 0.0–0.7)
EOS ABS: 0.2 10*3/uL (ref 0.0–0.7)
Eosinophils Relative: 1 % (ref 0–5)
Eosinophils Relative: 1 % (ref 0–5)
HCT: 20.6 % — ABNORMAL LOW (ref 39.0–52.0)
HEMATOCRIT: 20.5 % — AB (ref 39.0–52.0)
HEMOGLOBIN: 7.2 g/dL — AB (ref 13.0–17.0)
HEMOGLOBIN: 7.1 g/dL — AB (ref 13.0–17.0)
LYMPHS ABS: 6.9 10*3/uL — AB (ref 0.7–4.0)
LYMPHS PCT: 33 % (ref 12–46)
Lymphocytes Relative: 17 % (ref 12–46)
Lymphs Abs: 3.6 10*3/uL (ref 0.7–4.0)
MCH: 34.4 pg — ABNORMAL HIGH (ref 26.0–34.0)
MCH: 34.1 pg — AB (ref 26.0–34.0)
MCHC: 35.1 g/dL (ref 30.0–36.0)
MCHC: 34.5 g/dL (ref 30.0–36.0)
MCV: 98.1 fL (ref 78.0–100.0)
MCV: 99 fL (ref 78.0–100.0)
MONO ABS: 2.8 10*3/uL — AB (ref 0.1–1.0)
MONOS PCT: 13 % — AB (ref 3–12)
Monocytes Absolute: 1.7 10*3/uL — ABNORMAL HIGH (ref 0.1–1.0)
Monocytes Relative: 8 % (ref 3–12)
NEUTROS ABS: 12.2 10*3/uL — AB (ref 1.7–7.7)
NEUTROS PCT: 69 % (ref 43–77)
NRBC: 9 /100{WBCs} — AB
Neutro Abs: 14.6 10*3/uL — ABNORMAL HIGH (ref 1.7–7.7)
Neutrophils Relative %: 58 % (ref 43–77)
PLATELETS: 415 10*3/uL — AB (ref 150–400)
Platelets: 412 10*3/uL — ABNORMAL HIGH (ref 150–400)
RBC: 2.09 MIL/uL — ABNORMAL LOW (ref 4.22–5.81)
RBC: 2.08 MIL/uL — ABNORMAL LOW (ref 4.22–5.81)
RDW: 25.2 % — AB (ref 11.5–15.5)
RDW: 25.4 % — ABNORMAL HIGH (ref 11.5–15.5)
WBC: 21.2 10*3/uL — ABNORMAL HIGH (ref 4.0–10.5)
WBC: 21 10*3/uL — ABNORMAL HIGH (ref 4.0–10.5)
nRBC: 13 /100 WBC — ABNORMAL HIGH

## 2014-05-28 LAB — RETICULOCYTES
RBC.: 2.08 MIL/uL — ABNORMAL LOW (ref 4.22–5.81)
RETIC CT PCT: 20.7 % — AB (ref 0.4–3.1)
Retic Count, Absolute: 430.6 10*3/uL — ABNORMAL HIGH (ref 19.0–186.0)

## 2014-05-28 LAB — LACTATE DEHYDROGENASE: LDH: 377 U/L — ABNORMAL HIGH (ref 94–250)

## 2014-05-28 MED ORDER — DIPHENHYDRAMINE HCL 12.5 MG/5ML PO ELIX: 12.5000 mg | ORAL_SOLUTION | Freq: Four times a day (QID) | ORAL | Status: DC | PRN

## 2014-05-28 MED ORDER — SENNOSIDES-DOCUSATE SODIUM 8.6-50 MG PO TABS
1.0000 | ORAL_TABLET | Freq: Two times a day (BID) | ORAL | Status: DC
  Administered 2014-05-28 – 2014-05-30 (×5): 1 via ORAL
  Filled 2014-05-28 (×5): qty 1

## 2014-05-28 MED ORDER — ONDANSETRON HCL 4 MG/2ML IJ SOLN: 4.0000 mg | Freq: Four times a day (QID) | INTRAMUSCULAR | Status: DC | PRN

## 2014-05-28 MED ORDER — NALOXONE HCL 0.4 MG/ML IJ SOLN: 0.4000 mg | INTRAMUSCULAR | Status: DC | PRN

## 2014-05-28 MED ORDER — KETOROLAC TROMETHAMINE 30 MG/ML IJ SOLN
30.0000 mg | Freq: Four times a day (QID) | INTRAMUSCULAR | Status: DC
  Administered 2014-05-28 – 2014-05-30 (×8): 30 mg via INTRAVENOUS
  Filled 2014-05-28 (×14): qty 1

## 2014-05-28 MED ORDER — MAGNESIUM SULFATE 2 GM/50ML IV SOLN
2.0000 g | Freq: Once | INTRAVENOUS | Status: AC
  Administered 2014-05-28: 2 g via INTRAVENOUS
  Filled 2014-05-28: qty 50

## 2014-05-28 MED ORDER — POTASSIUM CHLORIDE CRYS ER 20 MEQ PO TBCR
40.0000 meq | EXTENDED_RELEASE_TABLET | ORAL | Status: AC
  Administered 2014-05-28 (×2): 40 meq via ORAL
  Filled 2014-05-28 (×2): qty 2

## 2014-05-28 MED ORDER — DILTIAZEM HCL ER COATED BEADS 120 MG PO CP24
120.0000 mg | ORAL_CAPSULE | Freq: Every day | ORAL | Status: DC
  Administered 2014-05-28 – 2014-05-30 (×3): 120 mg via ORAL
  Filled 2014-05-28 (×3): qty 1

## 2014-05-28 MED ORDER — RIVAROXABAN 20 MG PO TABS
20.0000 mg | ORAL_TABLET | Freq: Every morning | ORAL | Status: DC
  Administered 2014-05-28 – 2014-05-30 (×3): 20 mg via ORAL
  Filled 2014-05-28 (×3): qty 1

## 2014-05-28 MED ORDER — METOPROLOL TARTRATE 25 MG PO TABS
25.0000 mg | ORAL_TABLET | Freq: Every day | ORAL | Status: DC
  Administered 2014-05-28 – 2014-05-30 (×3): 25 mg via ORAL
  Filled 2014-05-28 (×3): qty 1

## 2014-05-28 MED ORDER — SODIUM CHLORIDE 0.9 % IJ SOLN
10.0000 mL | INTRAMUSCULAR | Status: DC | PRN
  Administered 2014-05-28 – 2014-05-30 (×3): 10 mL
  Filled 2014-05-28 (×3): qty 40

## 2014-05-28 MED ORDER — SODIUM CHLORIDE 0.9 % IJ SOLN: 9.0000 mL | INTRAMUSCULAR | Status: DC | PRN

## 2014-05-28 MED ORDER — ONDANSETRON HCL 4 MG/2ML IJ SOLN: 4.0000 mg | INTRAMUSCULAR | Status: DC | PRN

## 2014-05-28 MED ORDER — HYDROXYUREA 500 MG PO CAPS
1500.0000 mg | ORAL_CAPSULE | Freq: Every day | ORAL | Status: DC
  Administered 2014-05-28 – 2014-05-30 (×3): 1500 mg via ORAL
  Filled 2014-05-28 (×3): qty 3

## 2014-05-28 MED ORDER — ONDANSETRON HCL 4 MG PO TABS: 4.0000 mg | ORAL_TABLET | ORAL | Status: DC | PRN

## 2014-05-28 MED ORDER — VITAMIN D3 25 MCG (1000 UNIT) PO TABS
2000.0000 [IU] | ORAL_TABLET | Freq: Every day | ORAL | Status: DC
  Administered 2014-05-28 – 2014-05-30 (×3): 2000 [IU] via ORAL
  Filled 2014-05-28 (×3): qty 2

## 2014-05-28 MED ORDER — FOLIC ACID 1 MG PO TABS
1.0000 mg | ORAL_TABLET | Freq: Every morning | ORAL | Status: DC
  Administered 2014-05-28 – 2014-05-30 (×3): 1 mg via ORAL
  Filled 2014-05-28 (×3): qty 1

## 2014-05-28 MED ORDER — DIPHENHYDRAMINE HCL 50 MG/ML IJ SOLN
12.5000 mg | Freq: Four times a day (QID) | INTRAMUSCULAR | Status: DC | PRN
  Administered 2014-05-28: 12.5 mg via INTRAVENOUS
  Filled 2014-05-28: qty 1

## 2014-05-28 MED ORDER — HYDROMORPHONE 0.3 MG/ML IV SOLN
INTRAVENOUS | Status: DC
  Administered 2014-05-28: 1.7 mg via INTRAVENOUS
  Administered 2014-05-28: 02:00:00 via INTRAVENOUS
  Administered 2014-05-28: 4.2 mg via INTRAVENOUS
  Administered 2014-05-28: 2.7 mg via INTRAVENOUS
  Administered 2014-05-28: 11:00:00 via INTRAVENOUS
  Administered 2014-05-28: 0.9 mg via INTRAVENOUS
  Filled 2014-05-28 (×2): qty 25

## 2014-05-28 MED ORDER — POTASSIUM CHLORIDE CRYS ER 20 MEQ PO TBCR
20.0000 meq | EXTENDED_RELEASE_TABLET | Freq: Every morning | ORAL | Status: DC
  Administered 2014-05-28 – 2014-05-30 (×3): 20 meq via ORAL
  Filled 2014-05-28 (×3): qty 1

## 2014-05-28 MED ORDER — POLYETHYLENE GLYCOL 3350 17 G PO PACK: 17.0000 g | PACK | Freq: Every day | ORAL | Status: DC | PRN

## 2014-05-28 MED ORDER — CETYLPYRIDINIUM CHLORIDE 0.05 % MT LIQD
7.0000 mL | Freq: Two times a day (BID) | OROMUCOSAL | Status: DC
  Administered 2014-05-28 – 2014-05-30 (×5): 7 mL via OROMUCOSAL

## 2014-05-28 MED ORDER — DIPHENHYDRAMINE HCL 25 MG PO CAPS
25.0000 mg | ORAL_CAPSULE | Freq: Four times a day (QID) | ORAL | Status: DC | PRN
  Administered 2014-05-28: 25 mg via ORAL
  Administered 2014-05-28 – 2014-05-30 (×3): 50 mg via ORAL
  Filled 2014-05-28: qty 1
  Filled 2014-05-28 (×2): qty 2
  Filled 2014-05-28 (×2): qty 1

## 2014-05-28 MED ORDER — LISINOPRIL 10 MG PO TABS
10.0000 mg | ORAL_TABLET | Freq: Every day | ORAL | Status: DC
  Administered 2014-05-28 – 2014-05-30 (×3): 10 mg via ORAL
  Filled 2014-05-28 (×3): qty 1

## 2014-05-28 MED ORDER — FOLIC ACID 1 MG PO TABS: 1.0000 mg | ORAL_TABLET | Freq: Every day | ORAL | Status: DC

## 2014-05-28 MED ORDER — ZOLPIDEM TARTRATE 10 MG PO TABS
10.0000 mg | ORAL_TABLET | Freq: Every evening | ORAL | Status: DC | PRN
  Administered 2014-05-28 – 2014-05-29 (×2): 10 mg via ORAL
  Filled 2014-05-28 (×2): qty 1

## 2014-05-28 MED ORDER — DEXTROSE-NACL 5-0.45 % IV SOLN
INTRAVENOUS | Status: DC
  Administered 2014-05-28 (×2): via INTRAVENOUS

## 2014-05-28 MED ORDER — HYDROMORPHONE 2 MG/ML HIGH CONCENTRATION IV PCA SOLN
INTRAVENOUS | Status: DC
  Administered 2014-05-28: 19:00:00 via INTRAVENOUS
  Administered 2014-05-29: 1.8 mg via INTRAVENOUS
  Administered 2014-05-29: 1.5 mg via INTRAVENOUS
  Administered 2014-05-29: 5.4 mg via INTRAVENOUS
  Administered 2014-05-29: 4.8 mg via INTRAVENOUS
  Administered 2014-05-30: 5.6 mg via INTRAVENOUS
  Administered 2014-05-30: 4.2 mg via INTRAVENOUS
  Administered 2014-05-30: 6.3 mg via INTRAVENOUS
  Administered 2014-05-30: 2 mg via INTRAVENOUS
  Administered 2014-05-30: 7 mg via INTRAVENOUS
  Filled 2014-05-28 (×3): qty 25

## 2014-05-28 NOTE — Care Management Note (Addendum)
    Page 1 of 1   05/30/2014     2:28:50 PM CARE MANAGEMENT NOTE 05/30/2014  Patient:  [name], [name]   Account Number:  [id number]  Date Initiated:  05/28/2014  Documentation initiated by:  Dessa Phi  Subjective/Objective Assessment:   34 y/o m admitted w/SSC.Readmit 4/9-4/13-SSC.     Action/Plan:   From home.   Anticipated DC Date:  05/30/2014   Anticipated DC Plan:  [place]  CM consult  Medication Assistance      Choice offered to / List presented to:             Status of service:  Completed, signed off Medicare Important Message given?   (If response is "NO", the following Medicare IM given date fields will be blank) Date Medicare IM given:   Medicare IM given by:   Date Additional Medicare IM given:   Additional Medicare IM given by:    Discharge Disposition:  HOME/SELF CARE  Per UR Regulation:  Reviewed for med. necessity/level of care/duration of stay  If discussed at [place] of Stay Meetings, dates discussed:    Comments:  05/30/14 Dessa Phi Rn BSN NCM [phone] Spoke to paitent about prescription coverage.Patient states he has problems w/his insurance getting his meds.TC his pharmacy [facility] tel#[phone]-Stephanie- states they do not discuss w/patients if they had problems getting scripts filled.[facility] tel#[phone]-Marguerite-case worker-states she's been working on his medicaid missing paperwork to get it re-instated,she will f/u with patient once home. Spoke to patient about MATCH Program(1x use/no narcotics/$3 co pay for each script)patient states he only needs help with the dilaudid-explained that this program does not cover narcotics.He is on his own w/narcotics.He states "then I don't need the assistance because I have all the other meds,but it's the dilaudid that I need help with.Then he said I have to leave now.Informed nurse of conversation.No further d/c needs.  05/28/14 Dessa Phi RN  BSN NCM [phone] No anticipated d/c needs.

## 2014-05-28 NOTE — Progress Notes (Signed)
SICKLE CELL SERVICE PROGRESS NOTE  [name] QQP:[id number] DOB: [birth year] DOA: 05/27/2014 PCP: Rand Boller A., MD  Assessment/Plan: Active Problems:   Essential hypertension   Sickle cell crisis   Sickle cell anemia with crisis  1. Hb SS with crisis: Pt has been in the hospital < 24 hours. He states that pain is only mildly improved. He is opiate tolerant and I will change PCA to a high concentration weight based dosing add Ketorolac and continue IVF. 2. Leukocytosis: No evidence of infection. However patient has a productive cough and will  3. Anemia of chronic disease: Pt at baseline Hb.  4. Hypokalemia:  Pt has borderline low Magnesium 5. Chronic pain: Pt has been without his MS Contin for several days due to problems with his insurance. He however was able to pay out of pocket yesterday for his medications.   Code Status: Full Code Family Communication: N/A Disposition Plan: Not yet ready for discharge  [place].  Pager [phone]. If 7PM-7AM, please contact night-coverage.  05/28/2014, 5:23 PM  LOS: 0 days   Consultants:  None  Procedures:  None  Antibiotics:  None  HPI/Subjective: Pt states pain at 7/10 and localized to ribs and back.  Objective: Filed Vitals:   05/28/14 0815 05/28/14 1105 05/28/14 1351 05/28/14 1600  BP:   106/70   Pulse:   90   Temp:   98.3 F (36.8 C)   TempSrc:   Oral   Resp: 22 19 18 18   Height:      Weight:      SpO2: 96% 96% 98% 94%   Weight change:   Intake/Output Summary (Last 24 hours) at 05/28/14 1723 Last data filed at 05/28/14 0700  Gross per 24 hour  Intake  857.5 ml  Output      0 ml  Net  857.5 ml    General: Alert, awake, oriented x3, in no acute distress.  HEENT: [place]/AT PEERL, EOMI, mild icterus Neck: Trachea midline,  no masses, no thyromegal,y no JVD, no carotid bruit OROPHARYNX:  Moist, No exudate/ erythema/lesions.  Heart: Regular rate and rhythm, without murmurs, rubs,  gallops. Lungs: Clear to auscultation, no wheezing or rhonchi noted.  Abdomen: Soft, nontender, nondistended, positive bowel sounds, no masses no hepatosplenomegaly noted.  Neuro: No focal neurological deficits noted cranial nerves II through XII grossly intact.  Strength at baseline in bilateral upper and lower extremities. Musculoskeletal: No warm swelling or erythema around joints, no spinal tenderness noted. Psychiatric: Patient alert and oriented x3, good insight and cognition, good recent to remote recall. Lymph node survey: No cervical axillary or inguinal lymphadenopathy noted.   Data Reviewed: Basic Metabolic Panel:  Recent Labs Lab 05/22/14 0130 05/24/14 1829 05/26/14 0222 05/27/14 2049 05/28/14 0151  NA 132* 138 141 136 135  K 3.5 3.8 3.7 3.0* 3.3*  CL 108 106 107 107 106  CO2 23 23  --  21 21  GLUCOSE 109* 110* 115* 118* 133*  BUN 12 14 10  5* 7  CREATININE 0.56 0.65 0.60 0.64 0.69  CALCIUM 8.4 8.9  --  9.0 8.7  MG  --   --   --   --  1.5   Liver Function Tests:  Recent Labs Lab 05/22/14 0130 05/24/14 1829 05/27/14 2049 05/28/14 0151  AST 34 41* 43* 40*  ALT 21 27 24 23   ALKPHOS 83 70 74 73  BILITOT 5.3* 5.0* 4.7* 5.2*  PROT 7.2 8.2 7.5 7.8  ALBUMIN 4.0 4.5 4.1 4.3  No results for input(s): LIPASE, AMYLASE in the last 168 hours. No results for input(s): AMMONIA in the last 168 hours. CBC:  Recent Labs Lab 05/22/14 0130 05/24/14 1829 05/26/14 0210 05/26/14 0222 05/27/14 2049 05/28/14 0151  WBC 16.8* 15.3* 16.2*  --  21.2* 21.0*  NEUTROABS 11.3* 11.5* 11.1*  --  14.6* 12.2*  HGB 6.9* 6.9* 6.5* 7.1* 7.2* 7.1*  HCT 19.9* 19.9* 18.8* 21.0* 20.5* 20.6*  MCV 99.0 94.8 95.4  --  98.1 99.0  PLT 376 388 366  --  415* 412*   Cardiac Enzymes: No results for input(s): CKTOTAL, CKMB, CKMBINDEX, TROPONINI in the last 168 hours. BNP (last 3 results) No results for input(s): BNP in the last 8760 hours.  ProBNP (last 3 results)  Recent Labs   01/01/14 0909  PROBNP 5790.0*    CBG: No results for input(s): GLUCAP in the last 168 hours.  No results found for this or any previous visit (from the past 240 hour(s)).   Studies: Dg Chest 2 View  05/22/2014   CLINICAL DATA:  Dry cough for 2 days.  EXAM: CHEST  2 VIEW  COMPARISON:  04/29/2014  FINDINGS: Left subclavian porta catheter, tip at the upper cavoatrial junction.  Stable mild cardiomegaly.  Stable aortic and hilar contours.  Scattered interstitial opacity consistent with mild scarring. There is no edema, consolidation, effusion, or pneumothorax.  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  IMPRESSION: 1. No active cardiopulmonary disease. 2. Cardiomegaly and mild lung scarring.   Electronically Signed   By: Monte Fantasia M.D.   On: 05/22/2014 04:44   Dg Chest 2 View  04/29/2014   CLINICAL DATA:  Sickle cell crisis.  Pain.  EXAM: CHEST  2 VIEW  COMPARISON:  04/24/2014 .  FINDINGS: Power Port catheter noted with lead tip projected over the cavoatrial junction. Mediastinum and hilar structures normal. Cardiomegaly. Stable bilateral pleural parenchymal scarring, no change. No acute infiltrate. No pleural effusion or pneumothorax. Surgical clips right upper quadrant. No acute bony abnormality.  IMPRESSION: 1. Stable changes of pleural parenchymal scarring. No acute infiltrate. 2. Stable cardiomegaly. 3. Power port catheter in stable position.   Electronically Signed   By: Marcello Moores  Register   On: 04/29/2014 08:29    Scheduled Meds: . antiseptic oral rinse  7 mL Mouth Rinse BID  . cholecalciferol  2,000 Units Oral Daily  . diltiazem  120 mg Oral Daily  . folic acid  1 mg Oral q morning - 10a  . HYDROmorphone PCA 0.3 mg/mL   Intravenous 6 times per day  . hydroxyurea  1,500 mg Oral Daily  . lisinopril  10 mg Oral Daily  . metoprolol tartrate  25 mg Oral Daily  . potassium chloride SA  20 mEq Oral q morning - 10a  . rivaroxaban  20 mg Oral q morning - 10a  .  senna-docusate  1 tablet Oral BID   Continuous Infusions: . dextrose 5 % and 0.45% NaCl 75 mL/hr at 05/28/14 0158    Time spent 30 minutes

## 2014-05-28 NOTE — H&P (Signed)
[facility] History and Physical  [name]:[id number] DOB: 14-Jul-1980 DOA: 05/27/2014  Referring physician: Christ Kick, MD PCP: [name]., MD   Chief Complaint: Sickle Crisis  HPI: [name] is a 34 y.o. male presents with sickle crisis and pain. [facility]. He states that the pain is in his ribs and in his legs. At the time he rated it as 7-8/10. Patient was admitted to the center and was treated and released. He now presents back with no improvement. He states some shortness of breath is noted also. He has no headache or dizziness. He states he has not passed out. There is no fever or chills. He has no cough noted.   Review of Systems:  Complete ROS performed and is unremarkable other than HPI  Past Medical History  Diagnosis Date  . Sickle cell anemia   . Blood transfusion   . Acute embolism and thrombosis of right internal jugular vein   . Hypokalemia   . Mood disorder   . History of pulmonary embolus (PE)   . Avascular necrosis   . Leukocytosis     Chronic  . Thrombocytosis     Chronic  . Hypertension   . History of Clostridium difficile infection   . Uses marijuana   . Chronic anticoagulation   . Functional asplenia   . Former smoker   . Second hand tobacco smoke exposure   . Alcohol consumption of one to four drinks per day   . Noncompliance with medication regimen   . Sickle-cell crisis with associated acute chest syndrome 05/13/2013  . Acute chest syndrome 06/18/2013  . Demand ischemia 01/02/2014   Past Surgical History  Procedure Laterality Date  . Right hip replacement      08/2006  . Cholecystectomy      01/2008  . Porta cath placement    . Porta cath removal    . Umbilical hernia repair      01/2008  . Excision of left periauricular cyst      10/2009  . Excision of right ear lobe cyst with primary closur      11/2007  . Portacath placement  01/05/2012    Procedure: INSERTION PORT-A-CATH;  Surgeon: Odis Hollingshead, MD;  Location: [place];  Service: General;  Laterality: N/A;  ultrasound guiced port a cath insertion with fluoroscopy   Social History:  reports that he quit smoking about 3 years ago. He has never used smokeless tobacco. He reports that he uses illicit drugs (Marijuana) about twice per week. He reports that he does not drink alcohol.  No Known Allergies  Family History  Problem Relation Age of Onset  . Sickle cell trait Mother   . Depression Mother   . Diabetes Mother   . Sickle cell trait Father   . Sickle cell trait Brother      Prior to Admission medications   Medication Sig Start Date End Date Taking? Authorizing Provider  Cholecalciferol (VITAMIN D) 2000 UNITS tablet Take 1 tablet (2,000 Units total) by mouth daily. 02/13/14  Yes [name], MD  diltiazem (CARDIZEM CD) 120 MG 24 hr capsule Take 1 capsule (120 mg total) by mouth daily. 03/13/14  Yes Costin Karlyne Greenspan, MD  folic acid (FOLVITE) 1 MG tablet Take 1 tablet (1 mg total) by mouth every morning. 10/08/13  Yes [name], MD  HYDROmorphone (DILAUDID) 4 MG tablet Take 1 tablet (4  mg total) by mouth every 4 (four) hours as needed for severe pain. 05/02/14  Yes [name], MD  hydroxyurea (HYDREA) 500 MG capsule Take 3 capsules (1,500 mg total) by mouth daily. May take with food to minimize GI side effects. 10/30/13  Yes [name], MD  lisinopril (PRINIVIL,ZESTRIL) 10 MG tablet Take 1 tablet (10 mg total) by mouth daily. 01/22/14  Yes [name], MD  metoprolol tartrate (LOPRESSOR) 25 MG tablet Take 1 tablet (25 mg total) by mouth daily. 08/22/13  Yes [name], MD  morphine (MS CONTIN) 30 MG 12 hr tablet Take 1 tablet (30 mg total) by mouth every 12 (twelve) hours. 05/21/14  Yes [name], MD  potassium chloride SA (K-DUR,KLOR-CON) 20 MEQ tablet Take 1 tablet (20 mEq total) by mouth every morning. 09/25/13  Yes  [name], MD  rivaroxaban (XARELTO) 20 MG TABS tablet Take 1 tablet (20 mg total) by mouth every morning. 03/13/14  Yes Costin Karlyne Greenspan, MD  zolpidem (AMBIEN) 10 MG tablet Take 1 tablet (10 mg total) by mouth at bedtime as needed for sleep. 04/01/14  Yes [name], MD  clotrimazole (MYCELEX) 10 MG troche Take 1 tablet (10 mg total) by mouth 5 (five) times daily. Patient not taking: Reported on 05/27/2014 05/26/14 06/04/14  Doy Hutching, MD   Physical Exam: Filed Vitals:   05/27/14 2112 05/27/14 2200 05/27/14 2230 05/27/14 2300  BP: 115/69 125/90 100/49 120/75  Pulse: 89 103 109 98  Temp:      TempSrc:      Resp: 16 18 19 25   SpO2: 97% 82% 86% 98%    Wt Readings from Last 3 Encounters:  05/26/14 73.936 kg (163 lb)  05/07/14 75.841 kg (167 lb 3.2 oz)  04/30/14 74.39 kg (164 lb)    General:  Appears calm and comfortable Eyes: PERRL, normal lids, irises & conjunctiva ENT: grossly normal hearing, lips & tongue Neck: no LAD, masses or thyromegaly Cardiovascular: RRR, no m/r/g. No LE edema. Respiratory: CTA bilaterally, no w/r/r. Normal respiratory effort. Abdomen: soft, ntnd Skin: no rash or induration seen on limited exam Musculoskeletal: grossly normal tone c/o pain all over Psychiatric: grossly normal mood and affect, speech fluent and appropriate Neurologic: grossly non-focal.          Labs on Admission:  Basic Metabolic Panel:  Recent Labs Lab 05/22/14 0130 05/24/14 1829 05/26/14 0222 05/27/14 2049  NA 132* 138 141 136  K 3.5 3.8 3.7 3.0*  CL 108 106 107 107  CO2 23 23  --  21  GLUCOSE 109* 110* 115* 118*  BUN 12 14 10  5*  CREATININE 0.56 0.65 0.60 0.64  CALCIUM 8.4 8.9  --  9.0   Liver Function Tests:  Recent Labs Lab 05/22/14 0130 05/24/14 1829 05/27/14 2049  AST 34 41* 43*  ALT 21 27 24   ALKPHOS 83 70 74  BILITOT 5.3* 5.0* 4.7*  PROT 7.2 8.2 7.5  ALBUMIN 4.0 4.5 4.1   No results for input(s): LIPASE, AMYLASE in the last 168  hours. No results for input(s): AMMONIA in the last 168 hours. CBC:  Recent Labs Lab 05/22/14 0130 05/24/14 1829 05/26/14 0210 05/26/14 0222 05/27/14 2049  WBC 16.8* 15.3* 16.2*  --  21.2*  NEUTROABS 11.3* 11.5* 11.1*  --  14.6*  HGB 6.9* 6.9* 6.5* 7.1* 7.2*  HCT 19.9* 19.9* 18.8* 21.0* 20.5*  MCV 99.0 94.8 95.4  --  98.1  PLT 376 388 366  --  415*   Cardiac Enzymes: No results for input(s): CKTOTAL, CKMB, CKMBINDEX, TROPONINI in the last 168 hours.  BNP (last 3 results) No results for input(s): BNP in the last 8760 hours.  ProBNP (last 3 results)  Recent Labs  01/01/14 0909  PROBNP 5790.0*    CBG: No results for input(s): GLUCAP in the last 168 hours.  Radiological Exams on Admission: No results found.  Assessment/Plan Active Problems:   Essential hypertension   Sickle cell crisis   Sickle cell anemia with crisis   1. Sickle Cell Crisis -will admit for hydration -started on PCA pump pain control  2. Essential Hypertension -will continue with home medications -monitor pressures  3. H/o Pulmonary Embolism -on chronic anticoagulation  4. Anemia -will type and cross -transfuse as needed    Code Status: Full Code (must indicate code status--if unknown or must be presumed, indicate so) DVT Prophylaxis:Full Dose Anticoagualtion Family Communication: None (indicate person spoken with, if applicable, with phone number if by telephone) Disposition Plan: Home (indicate anticipated LOS)  Time spent: 58min  Jarron Curley A [facility] Pager [phone]

## 2014-05-29 DIAGNOSIS — D5700 Hb-SS disease with crisis, unspecified: Principal | ICD-10-CM

## 2014-05-29 LAB — CBC WITH DIFFERENTIAL/PLATELET
BASOS PCT: 0 % (ref 0–1)
Basophils Absolute: 0 10*3/uL (ref 0.0–0.1)
Eosinophils Absolute: 0.5 10*3/uL (ref 0.0–0.7)
Eosinophils Relative: 3 % (ref 0–5)
HEMATOCRIT: 18.2 % — AB (ref 39.0–52.0)
Hemoglobin: 6.2 g/dL — CL (ref 13.0–17.0)
Lymphocytes Relative: 17 % (ref 12–46)
Lymphs Abs: 2.9 10*3/uL (ref 0.7–4.0)
MCH: 34.8 pg — AB (ref 26.0–34.0)
MCHC: 34.1 g/dL (ref 30.0–36.0)
MCV: 102.2 fL — AB (ref 78.0–100.0)
Monocytes Absolute: 2.2 10*3/uL — ABNORMAL HIGH (ref 0.1–1.0)
Monocytes Relative: 13 % — ABNORMAL HIGH (ref 3–12)
NEUTROS ABS: 11.6 10*3/uL — AB (ref 1.7–7.7)
Neutrophils Relative %: 67 % (ref 43–77)
PLATELETS: 346 10*3/uL (ref 150–400)
RBC: 1.78 MIL/uL — ABNORMAL LOW (ref 4.22–5.81)
RDW: 26.4 % — AB (ref 11.5–15.5)
WBC: 17.2 10*3/uL — ABNORMAL HIGH (ref 4.0–10.5)
nRBC: 26 /100 WBC — ABNORMAL HIGH

## 2014-05-29 LAB — COMPREHENSIVE METABOLIC PANEL
ALT: 23 U/L (ref 0–53)
ANION GAP: 7 (ref 5–15)
AST: 28 U/L (ref 0–37)
Albumin: 3.8 g/dL (ref 3.5–5.2)
Alkaline Phosphatase: 74 U/L (ref 39–117)
BUN: 9 mg/dL (ref 6–23)
CALCIUM: 8.5 mg/dL (ref 8.4–10.5)
CO2: 21 mmol/L (ref 19–32)
Chloride: 112 mmol/L (ref 96–112)
Creatinine, Ser: 0.63 mg/dL (ref 0.50–1.35)
GFR calc Af Amer: >90 mL/min (ref >90)
GFR calc non Af Amer: >90 mL/min (ref >90)
Glucose, Bld: 137 mg/dL — ABNORMAL HIGH (ref 70–99)
Potassium: 3.7 mmol/L (ref 3.5–5.1)
SODIUM: 140 mmol/L (ref 135–145)
Total Bilirubin: 3.1 mg/dL — ABNORMAL HIGH (ref 0.3–1.2)
Total Protein: 6.6 g/dL (ref 6.0–8.3)

## 2014-05-29 LAB — LACTATE DEHYDROGENASE: LDH: 327 U/L — ABNORMAL HIGH (ref 94–250)

## 2014-05-29 MED ORDER — MORPHINE SULFATE ER 30 MG PO TBCR
30.0000 mg | EXTENDED_RELEASE_TABLET | Freq: Two times a day (BID) | ORAL | Status: DC
  Administered 2014-05-29 – 2014-05-30 (×3): 30 mg via ORAL
  Filled 2014-05-29 (×3): qty 1

## 2014-05-29 NOTE — Progress Notes (Signed)
CRITICAL VALUE ALERT  Critical value received:  Hgb 6.2  Date of notification:  05/29/14  Time of notification:  0630  Critical value read back:yes  Nurse who received alert:  Damita Dunnings  MD notified (1st page): Hal Hope  Time of first page:  [phone]  MD notified (2nd page):  Time of second page:  Responding MD:  Hal Hope  Time MD responded:  [id number] - To follow up with Sickle Cell Physician.

## 2014-05-29 NOTE — Progress Notes (Signed)
Subjective: 34 year old gentleman well-known to our service who is opiate dependent admitted with sickle cell painful crisis.  Patient has been on Dilaudid PCA overnight and has used only 17 mg was 64 demands and 54 deliveries.  His pain is at 6 out of 10 he feels comfortable and looks comfortable.  His hemoglobin has dropped to 6.2 from 7.28most likely due to hemodilution.  He denied chest pain, no shortness of breath, no nausea vomiting or diarrhea.  Objective: Vital signs in last 24 hours: Temp:  [98.3 F (36.8 C)-98.9 F (37.2 C)] 98.5 F (36.9 C) (04/28 0514) Pulse Rate:  [71-90] 71 (04/28 0514) Resp:  [17-24] 24 (04/28 0756) BP: (106-110)/(63-72) 107/70 mmHg (04/28 0514) SpO2:  [94 %-100 %] 98 % (04/28 0756) Weight change:  Last BM Date: 05/27/14  Intake/Output from previous day: 04/27 0701 - 04/28 0700 In: 1747.5 [I.V.:1747.5] Out: 550 [Urine:550] Intake/Output this shift:    General appearance: alert, cooperative and no distress Eyes: conjunctivae/corneas clear. PERRL, EOM's intact. Fundi benign. Neck: no adenopathy, no carotid bruit, no JVD, supple, symmetrical, trachea midline and thyroid not enlarged, symmetric, no tenderness/mass/nodules Back: symmetric, no curvature. ROM normal. No CVA tenderness. Resp: clear to auscultation bilaterally Cardio: regular rate and rhythm, S1, S2 normal, no murmur, click, rub or gallop GI: soft, non-tender; bowel sounds normal; no masses,  no organomegaly Extremities: extremities normal, atraumatic, no cyanosis or edema Pulses: 2+ and symmetric Skin: Skin color, texture, turgor normal. No rashes or lesions Neurologic: Grossly normal  Lab Results:  Recent Labs  05/28/14 0151 05/29/14 0455  WBC 21.0* 17.2*  HGB 7.1* 6.2*  HCT 20.6* 18.2*  PLT 412* 346   BMET  Recent Labs  05/28/14 0151 05/29/14 0455  NA 135 140  K 3.3* 3.7  CL 106 112  CO2 21 21  GLUCOSE 133* 137*  BUN 7 9  CREATININE 0.69 0.63  CALCIUM 8.7 8.5     Studies/Results: Dg Chest 2 View  05/28/2014   CLINICAL DATA:  Bilateral lower rib pain and productive cough, sickle cell patient.  EXAM: CHEST  2 VIEW  COMPARISON:  05/22/2014 and 04/29/2014.  FINDINGS: Trachea is midline. Heart is enlarged. Left subclavian Port-A-Cath terminates in the high right atrium. Scattered pleural parenchymal scarring. No new areas of consolidation. No pleural fluid.  IMPRESSION: No acute findings.   Electronically Signed   By: Lorin Picket M.D.   On: 05/28/2014 18:40    Medications: I have reviewed the patient's current medications.  Assessment/Plan: 34 year old gentleman admitted with sickle cell painful crisis. #1 sickle cell painful crisis: Patient seems to be doing fine on his current regimen.  I will add his long-acting MS Contin from home.  Continue current PCA setting and plan for possible discharge next 24 hours  #2 sickle cell anemia: Probably hemodilution.  His LDH has not increased so I doubt this is hemolysis.  We will monitor on hold off on transfusion unless he keeps dropping  #3 chronic anticoagulation: Continue Xarelto.  #4 sickle cell disease: patient has chronic pain.  She has run out of medications apparently.  We will ask social workers to help him so he could continue to manage his disease appropriately.  LOS: 1 day   Sharna Gabrys,[name] 05/29/2014, 8:27 AM

## 2014-05-30 LAB — COMPREHENSIVE METABOLIC PANEL
ALBUMIN: 3.9 g/dL (ref 3.5–5.2)
ALK PHOS: 75 U/L (ref 39–117)
ALT: 24 U/L (ref 0–53)
ANION GAP: 5 (ref 5–15)
AST: 28 U/L (ref 0–37)
BUN: 10 mg/dL (ref 6–23)
CHLORIDE: 112 mmol/L (ref 96–112)
CO2: 22 mmol/L (ref 19–32)
Calcium: 8.7 mg/dL (ref 8.4–10.5)
Creatinine, Ser: 0.64 mg/dL (ref 0.50–1.35)
GFR calc Af Amer: >90 mL/min (ref >90)
GFR calc non Af Amer: >90 mL/min (ref >90)
GLUCOSE: 98 mg/dL (ref 70–99)
Potassium: 4.2 mmol/L (ref 3.5–5.1)
SODIUM: 139 mmol/L (ref 135–145)
Total Bilirubin: 3.6 mg/dL — ABNORMAL HIGH (ref 0.3–1.2)
Total Protein: 7.2 g/dL (ref 6.0–8.3)

## 2014-05-30 LAB — CBC WITH DIFFERENTIAL/PLATELET
BASOS PCT: 0 % (ref 0–1)
Basophils Absolute: 0 10*3/uL (ref 0.0–0.1)
Eosinophils Absolute: 0.5 10*3/uL (ref 0.0–0.7)
Eosinophils Relative: 3 % (ref 0–5)
HCT: 19.4 % — ABNORMAL LOW (ref 39.0–52.0)
Hemoglobin: 6.6 g/dL — CL (ref 13.0–17.0)
Lymphocytes Relative: 14 % (ref 12–46)
Lymphs Abs: 2.5 10*3/uL (ref 0.7–4.0)
MCH: 34.6 pg — AB (ref 26.0–34.0)
MCHC: 34 g/dL (ref 30.0–36.0)
MCV: 101.6 fL — ABNORMAL HIGH (ref 78.0–100.0)
Monocytes Absolute: 2.3 10*3/uL — ABNORMAL HIGH (ref 0.1–1.0)
Monocytes Relative: 13 % — ABNORMAL HIGH (ref 3–12)
Neutro Abs: 12.2 10*3/uL — ABNORMAL HIGH (ref 1.7–7.7)
Neutrophils Relative %: 70 % (ref 43–77)
Platelets: 390 10*3/uL (ref 150–400)
RBC: 1.91 MIL/uL — AB (ref 4.22–5.81)
RDW: 25.5 % — ABNORMAL HIGH (ref 11.5–15.5)
WBC: 17.5 10*3/uL — ABNORMAL HIGH (ref 4.0–10.5)
nRBC: 23 /100 WBC — ABNORMAL HIGH

## 2014-05-30 IMAGING — CR DG CHEST 2V
2 series · 2 of 2 positions shown · non-contrast
Comparison: 06/22/2011 and earlier.

CLINICAL DATA: 31-year-old male with left side chest pain.  History
of sickle cell disease.

CHEST - 2 VIEW

[w chest pa]
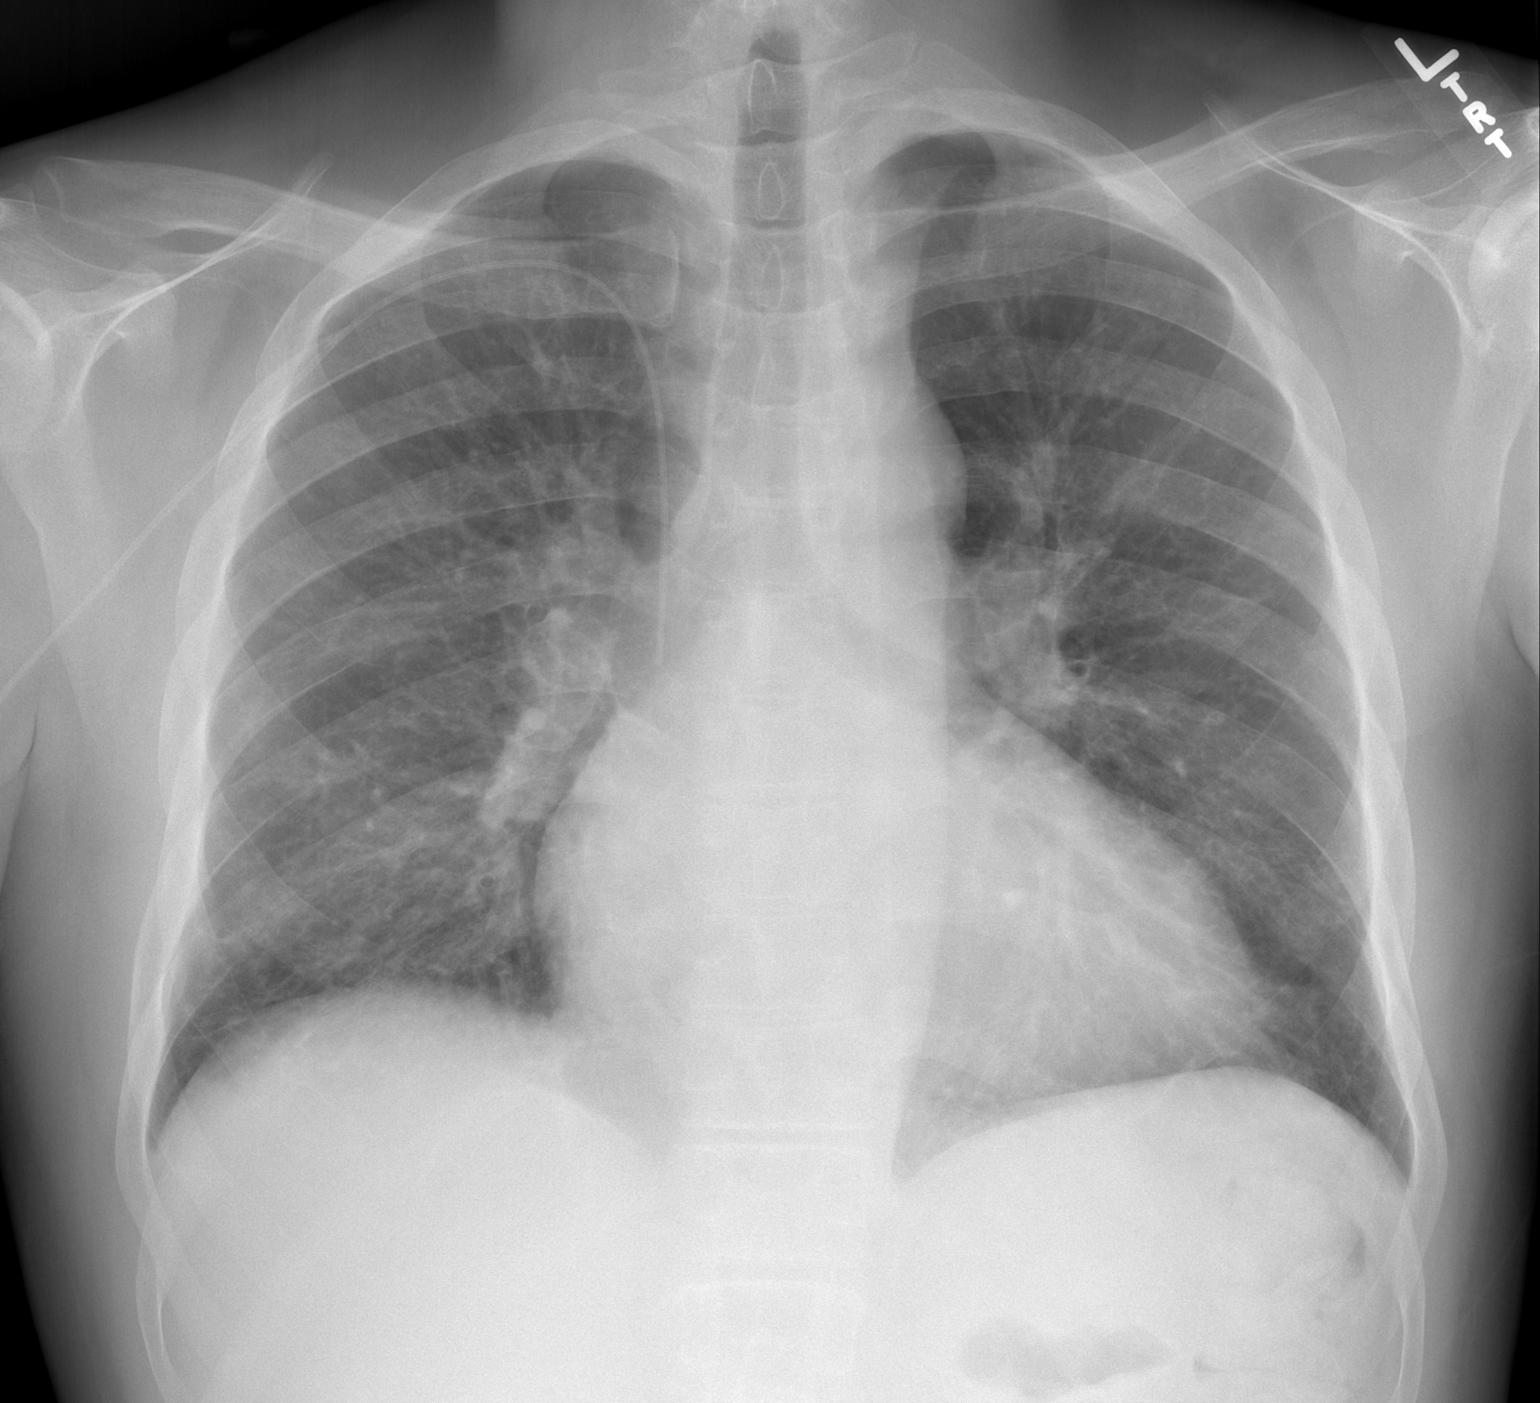

[w chest lat]
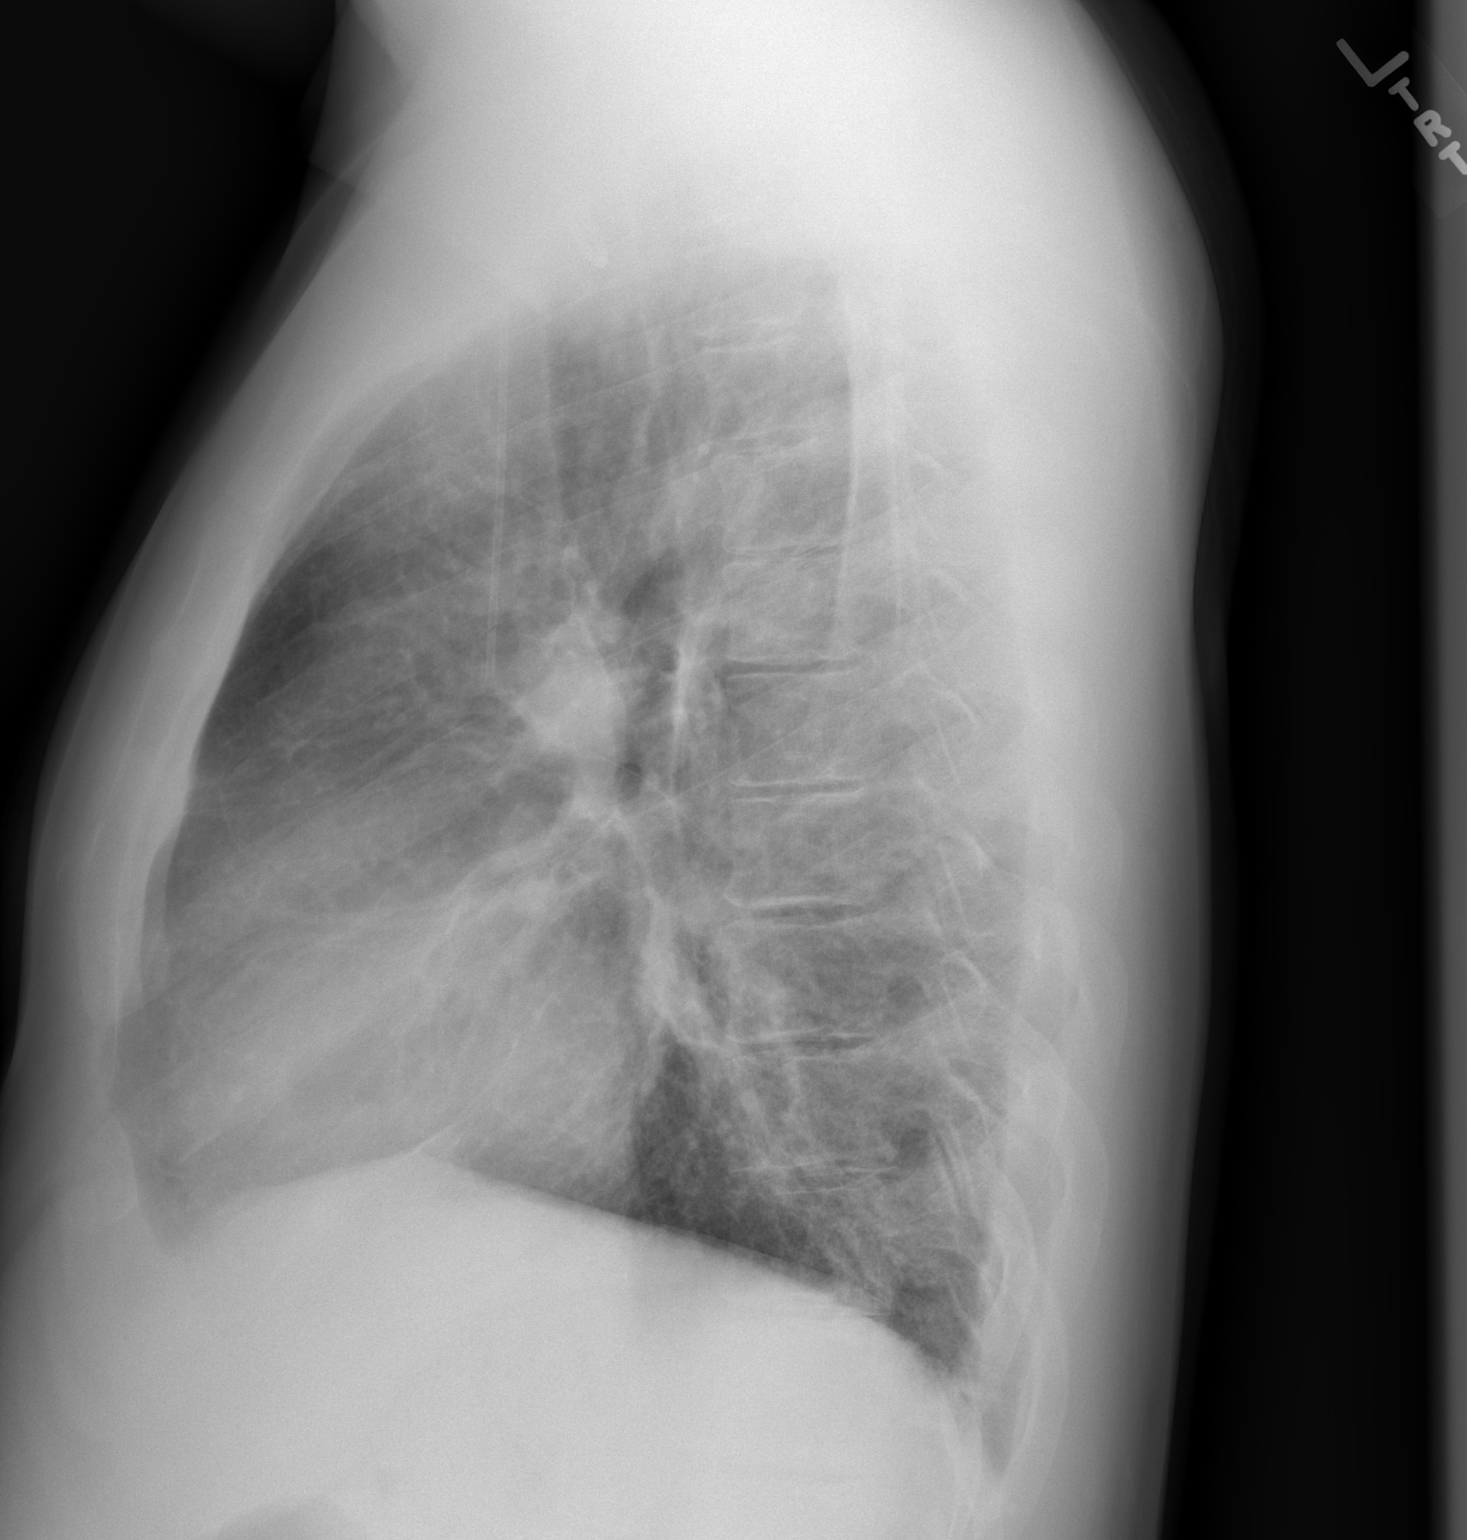

[2 of 2 positions shown; findings below may reference images not displayed]

FINDINGS: Right PICC line in place. Stable cardiomegaly and
mediastinal contours.  Visualized tracheal air column is within
normal limits.  Chronic increased interstitial markings are stable.
No pneumothorax, overt edema, pleural effusion or acute pulmonary
opacity. No acute osseous abnormality identified.
IMPRESSION: No acute cardiopulmonary abnormality.

## 2014-05-30 MED ORDER — HEPARIN SOD (PORK) LOCK FLUSH 100 UNIT/ML IV SOLN
500.0000 [IU] | INTRAVENOUS | Status: AC | PRN
  Administered 2014-05-30: 500 [IU]

## 2014-05-30 NOTE — Progress Notes (Signed)
CSW received inappropriate consult for medication assistance. RNCM, Kathy aware.   No further CSW needs identified - CSW signing off.   Raynaldo Opitz, [facility] Clinical Social Worker cell #: [phone]

## 2014-06-02 NOTE — Discharge Summary (Signed)
Physician Discharge Summary  [name] ESP:[id number] DOB: Apr 17, 1980 DOA: 05/27/2014  PCP: [name]., MD  Admit date: 05/27/2014 Discharge date:05/30/2014  Discharge Diagnoses:  Active Problems:   Essential hypertension   Sickle cell crisis   Sickle cell anemia with crisis   Discharge Condition: stable  Disposition: home   Diet:regular  Wt Readings from Last 3 Encounters:  05/28/14 72.3 kg (159 lb 6.3 oz)  05/26/14 73.936 kg (163 lb)  05/07/14 75.841 kg (167 lb 3.2 oz)    History of present illness:  [name] is a 34yo male with sickle cell disease who presents to the day hospital with pain in his right ribs that he rates an 8/10. He stated that the rib pain started yesterday. He tried taking his home Dilaudid but did not find much relief. Pt denies other symptoms such as cough, CP, and dyspnea. quit. He denies HA, dizziness, fever, SOB, weakness, vomiting, and diarrhea.   Hospital Course:  Pt was treated initially with weight based rapid re-dosing Dilaudid, IVF, and Toradol. His pain was at a 7/10, so he was then transitioned to a weight based Dilaudid PCA . Labs were checked previously in the ED and his Hgb was around his baseline of 7.1. Patient was re-evaluated and appeared improved and rated his pain as a 4/10. Thus he was discharged home, to continue home pain management. He was also noted to have thrush on exam. Also, pt has insurance issues where he has not been able to obtain his Dilaudid prescription. He was referred to his outpatient pharmacy for father treatment Discharge Exam:  Filed Vitals:   05/30/14 1155  BP:   Pulse:   Temp:   Resp: 20   Filed Vitals:   05/30/14 0514 05/30/14 0816 05/30/14 1138 05/30/14 1155  BP: 120/82     Pulse: 83     Temp: 98.3 F (36.8 C)     TempSrc: Oral     Resp: 16 20 25 20   Height:      Weight:      SpO2: 97% 97% 94% 95%   General: Alert, awake, oriented x3, in no acute distress.  HEENT: [place]/AT  PEERL, EOMI Neck: Trachea midline,  no masses, no lymphadenopathy. White plaques in the fissures of tongue and buccal mucosa OROPHARYNX:  Moist, No exudate/ erythema/lesions.  Heart: Regular rate and rhythm, without murmurs, rubs, gallops Lungs: Clear to auscultation, no wheezing or rhonchi noted.  Abdomen: Soft, nontender, nondistended, positive bowel sounds, no masses no hepatosplenomegaly noted.  Neuro: No focal neurological deficits noted cranial nerves II through XII grossly intact.  Strength 5 out of 5 in bilateral upper and lower extremities. Nl gait Musculoskeletal: No warm swelling or erythema around joints, no spinal tenderness noted. Extremities: +clubbing of fingers, no cyanosis or edema.   Discharge Instructions As above    Medication List    TAKE these medications        clotrimazole 10 MG troche  Commonly known as:  MYCELEX  Take 1 tablet (10 mg total) by mouth 5 (five) times daily.     diltiazem 120 MG 24 hr capsule  Commonly known as:  CARDIZEM CD  Take 1 capsule (120 mg total) by mouth daily.     folic acid 1 MG tablet  Commonly known as:  FOLVITE  Take 1 tablet (1 mg total) by mouth every morning.     HYDROmorphone 4 MG tablet  Commonly known as:  DILAUDID  Take 1 tablet (4 mg total)  by mouth every 4 (four) hours as needed for severe pain.     hydroxyurea 500 MG capsule  Commonly known as:  HYDREA  Take 3 capsules (1,500 mg total) by mouth daily. May take with food to minimize GI side effects.     lisinopril 10 MG tablet  Commonly known as:  PRINIVIL,ZESTRIL  Take 1 tablet (10 mg total) by mouth daily.     metoprolol tartrate 25 MG tablet  Commonly known as:  LOPRESSOR  Take 1 tablet (25 mg total) by mouth daily.     morphine 30 MG 12 hr tablet  Commonly known as:  MS CONTIN  Take 1 tablet (30 mg total) by mouth every 12 (twelve) hours.     potassium chloride SA 20 MEQ tablet  Commonly known as:  K-DUR,KLOR-CON  Take 1 tablet (20 mEq total) by  mouth every morning.     rivaroxaban 20 MG Tabs tablet  Commonly known as:  XARELTO  Take 1 tablet (20 mg total) by mouth every morning.     Vitamin D 2000 UNITS tablet  Take 1 tablet (2,000 Units total) by mouth daily.     zolpidem 10 MG tablet  Commonly known as:  AMBIEN  Take 1 tablet (10 mg total) by mouth at bedtime as needed for sleep.          The results of significant diagnostics from this hospitalization (including imaging, microbiology, ancillary and laboratory) are listed below for reference.    Significant Diagnostic Studies: Dg Chest 2 View  05/28/2014   CLINICAL DATA:  Bilateral lower rib pain and productive cough, sickle cell patient.  EXAM: CHEST  2 VIEW  COMPARISON:  05/22/2014 and 04/29/2014.  FINDINGS: Trachea is midline. Heart is enlarged. Left subclavian Port-A-Cath terminates in the high right atrium. Scattered pleural parenchymal scarring. No new areas of consolidation. No pleural fluid.  IMPRESSION: No acute findings.   Electronically Signed   By: Lorin Picket M.D.   On: 05/28/2014 18:40   Dg Chest 2 View  05/22/2014   CLINICAL DATA:  Dry cough for 2 days.  EXAM: CHEST  2 VIEW  COMPARISON:  04/29/2014  FINDINGS: Left subclavian porta catheter, tip at the upper cavoatrial junction.  Stable mild cardiomegaly.  Stable aortic and hilar contours.  Scattered interstitial opacity consistent with mild scarring. There is no edema, consolidation, effusion, or pneumothorax.  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  IMPRESSION: 1. No active cardiopulmonary disease. 2. Cardiomegaly and mild lung scarring.   Electronically Signed   By: Monte Fantasia M.D.   On: 05/22/2014 04:44    Microbiology: No results found for this or any previous visit (from the past 240 hour(s)).   Labs: Basic Metabolic Panel:  Recent Labs Lab 05/27/14 2049 05/28/14 0151 05/29/14 0455 05/30/14 0350  NA 136 135 140 139  K 3.0* 3.3* 3.7 4.2  CL 107 106 112 112  CO2  21 21 21 22   GLUCOSE 118* 133* 137* 98  BUN 5* 7 9 10   CREATININE 0.64 0.69 0.63 0.64  CALCIUM 9.0 8.7 8.5 8.7  MG  --  1.5  --   --    Liver Function Tests:  Recent Labs Lab 05/27/14 2049 05/28/14 0151 05/29/14 0455 05/30/14 0350  AST 43* 40* 28 28  ALT 24 23 23 24   ALKPHOS 74 73 74 75  BILITOT 4.7* 5.2* 3.1* 3.6*  PROT 7.5 7.8 6.6 7.2  ALBUMIN 4.1 4.3 3.8 3.9   No results  for input(s): LIPASE, AMYLASE in the last 168 hours. No results for input(s): AMMONIA in the last 168 hours. CBC:  Recent Labs Lab 05/27/14 2049 05/28/14 0151 05/29/14 0455 05/30/14 0350  WBC 21.2* 21.0* 17.2* 17.5*  NEUTROABS 14.6* 12.2* 11.6* 12.2*  HGB 7.2* 7.1* 6.2* 6.6*  HCT 20.5* 20.6* 18.2* 19.4*  MCV 98.1 99.0 102.2* 101.6*  PLT 415* 412* 346 390    Time total:>55 min  Signed:  Donita Newland,[name]  05/30/2014, 8:48 PM

## 2014-06-03 ENCOUNTER — Emergency Department (HOSPITAL_COMMUNITY): Payer: Medicare Other

## 2014-06-03 ENCOUNTER — Inpatient Hospital Stay (HOSPITAL_COMMUNITY)
Admission: EM | Admit: 2014-06-03 | Discharge: 2014-06-06 | DRG: 812 | Disposition: A | Discharge status: Home / Self Care | Payer: Medicare Other | Attending: Internal Medicine | Admitting: Internal Medicine

## 2014-06-03 ENCOUNTER — Encounter (HOSPITAL_COMMUNITY): Payer: Self-pay

## 2014-06-03 DIAGNOSIS — F39 Unspecified mood [affective] disorder: Secondary | ICD-10-CM | POA: Diagnosis not present

## 2014-06-03 DIAGNOSIS — M87822 Other osteonecrosis, left humerus: Secondary | ICD-10-CM | POA: Diagnosis not present

## 2014-06-03 DIAGNOSIS — D5700 Hb-SS disease with crisis, unspecified: Secondary | ICD-10-CM | POA: Diagnosis not present

## 2014-06-03 DIAGNOSIS — Z96641 Presence of right artificial hip joint: Secondary | ICD-10-CM | POA: Diagnosis not present

## 2014-06-03 DIAGNOSIS — Z7901 Long term (current) use of anticoagulants: Secondary | ICD-10-CM | POA: Diagnosis not present

## 2014-06-03 DIAGNOSIS — D72829 Elevated white blood cell count, unspecified: Secondary | ICD-10-CM | POA: Diagnosis not present

## 2014-06-03 DIAGNOSIS — D638 Anemia in other chronic diseases classified elsewhere: Secondary | ICD-10-CM | POA: Diagnosis not present

## 2014-06-03 DIAGNOSIS — R0781 Pleurodynia: Secondary | ICD-10-CM | POA: Diagnosis present

## 2014-06-03 DIAGNOSIS — R0789 Other chest pain: Secondary | ICD-10-CM | POA: Diagnosis present

## 2014-06-03 DIAGNOSIS — Z86711 Personal history of pulmonary embolism: Secondary | ICD-10-CM | POA: Diagnosis not present

## 2014-06-03 DIAGNOSIS — I10 Essential (primary) hypertension: Secondary | ICD-10-CM | POA: Diagnosis not present

## 2014-06-03 DIAGNOSIS — Z79891 Long term (current) use of opiate analgesic: Secondary | ICD-10-CM

## 2014-06-03 DIAGNOSIS — Z7722 Contact with and (suspected) exposure to environmental tobacco smoke (acute) (chronic): Secondary | ICD-10-CM | POA: Diagnosis present

## 2014-06-03 DIAGNOSIS — R0902 Hypoxemia: Secondary | ICD-10-CM | POA: Diagnosis not present

## 2014-06-03 DIAGNOSIS — Z87891 Personal history of nicotine dependence: Secondary | ICD-10-CM

## 2014-06-03 DIAGNOSIS — F1290 Cannabis use, unspecified, uncomplicated: Secondary | ICD-10-CM | POA: Diagnosis present

## 2014-06-03 DIAGNOSIS — J9811 Atelectasis: Secondary | ICD-10-CM | POA: Diagnosis present

## 2014-06-03 DIAGNOSIS — R52 Pain, unspecified: Secondary | ICD-10-CM

## 2014-06-03 DIAGNOSIS — G894 Chronic pain syndrome: Secondary | ICD-10-CM | POA: Diagnosis present

## 2014-06-03 DIAGNOSIS — M25512 Pain in left shoulder: Secondary | ICD-10-CM | POA: Diagnosis present

## 2014-06-03 DIAGNOSIS — Z9114 Patient's other noncompliance with medication regimen: Secondary | ICD-10-CM | POA: Diagnosis not present

## 2014-06-03 DIAGNOSIS — I4891 Unspecified atrial fibrillation: Secondary | ICD-10-CM | POA: Diagnosis present

## 2014-06-03 DIAGNOSIS — R0600 Dyspnea, unspecified: Secondary | ICD-10-CM | POA: Diagnosis not present

## 2014-06-03 DIAGNOSIS — J9611 Chronic respiratory failure with hypoxia: Secondary | ICD-10-CM | POA: Diagnosis present

## 2014-06-03 LAB — COMPREHENSIVE METABOLIC PANEL
ALBUMIN: 3.9 g/dL (ref 3.5–5.0)
ALT: 22 U/L (ref 17–63)
ANION GAP: 4 — AB (ref 5–15)
AST: 38 U/L (ref 15–41)
Alkaline Phosphatase: 79 U/L (ref 38–126)
BILIRUBIN TOTAL: 4.8 mg/dL — AB (ref 0.3–1.2)
BUN: 12 mg/dL (ref 6–20)
CHLORIDE: 108 mmol/L (ref 101–111)
CO2: 21 mmol/L — ABNORMAL LOW (ref 22–32)
CREATININE: 0.69 mg/dL (ref 0.61–1.24)
Calcium: 8.4 mg/dL — ABNORMAL LOW (ref 8.9–10.3)
GFR calc non Af Amer: >60 mL/min (ref >60)
Glucose, Bld: 125 mg/dL — ABNORMAL HIGH (ref 70–99)
POTASSIUM: 3.6 mmol/L (ref 3.5–5.1)
SODIUM: 133 mmol/L — AB (ref 135–145)
Total Protein: 7.4 g/dL (ref 6.5–8.1)

## 2014-06-03 LAB — CBC WITH DIFFERENTIAL/PLATELET
BASOS PCT: 1 % (ref 0–1)
Basophils Absolute: 0.2 10*3/uL — ABNORMAL HIGH (ref 0.0–0.1)
Eosinophils Absolute: 0.3 10*3/uL (ref 0.0–0.7)
Eosinophils Relative: 2 % (ref 0–5)
HCT: 17.5 % — ABNORMAL LOW (ref 39.0–52.0)
HEMOGLOBIN: 6 g/dL — AB (ref 13.0–17.0)
LYMPHS PCT: 19 % (ref 12–46)
Lymphs Abs: 2.9 10*3/uL (ref 0.7–4.0)
MCH: 33.9 pg (ref 26.0–34.0)
MCHC: 34.3 g/dL (ref 30.0–36.0)
MCV: 98.9 fL (ref 78.0–100.0)
MONO ABS: 2.3 10*3/uL — AB (ref 0.1–1.0)
Monocytes Relative: 15 % — ABNORMAL HIGH (ref 3–12)
NEUTROS ABS: 9.4 10*3/uL — AB (ref 1.7–7.7)
Neutrophils Relative %: 63 % (ref 43–77)
Platelets: 408 10*3/uL — ABNORMAL HIGH (ref 150–400)
RBC: 1.77 MIL/uL — ABNORMAL LOW (ref 4.22–5.81)
RDW: 24.9 % — ABNORMAL HIGH (ref 11.5–15.5)
WBC: 15.1 10*3/uL — ABNORMAL HIGH (ref 4.0–10.5)

## 2014-06-03 LAB — RETICULOCYTES
RBC.: 1.77 MIL/uL — ABNORMAL LOW (ref 4.22–5.81)
Retic Count, Absolute: 180.5 10*3/uL (ref 19.0–186.0)
Retic Ct Pct: 10.2 % — ABNORMAL HIGH (ref 0.4–3.1)

## 2014-06-03 MED ORDER — HYDROMORPHONE HCL 2 MG/ML IJ SOLN
2.0000 mg | Freq: Once | INTRAMUSCULAR | Status: AC
  Administered 2014-06-03: 2 mg via INTRAVENOUS
  Filled 2014-06-03: qty 1

## 2014-06-03 MED ORDER — ACETAMINOPHEN 325 MG PO TABS: 650.0000 mg | ORAL_TABLET | Freq: Four times a day (QID) | ORAL | Status: DC | PRN

## 2014-06-03 MED ORDER — ZOLPIDEM TARTRATE 10 MG PO TABS
10.0000 mg | ORAL_TABLET | Freq: Every evening | ORAL | Status: DC | PRN
  Administered 2014-06-03 – 2014-06-05 (×3): 10 mg via ORAL
  Filled 2014-06-03 (×3): qty 1

## 2014-06-03 MED ORDER — HYDROMORPHONE 2 MG/ML HIGH CONCENTRATION IV PCA SOLN
INTRAVENOUS | Status: DC
  Administered 2014-06-03: 2 mg via INTRAVENOUS
  Administered 2014-06-03: 5.6 mg via INTRAVENOUS
  Administered 2014-06-04: 50 mg via INTRAVENOUS
  Administered 2014-06-04: 7.2 mg via INTRAVENOUS
  Administered 2014-06-04: 11.2 mg via INTRAVENOUS
  Administered 2014-06-04: 17.6 mg via INTRAVENOUS
  Administered 2014-06-04: 3.2 mg via INTRAVENOUS
  Administered 2014-06-04: 8 mg via INTRAVENOUS
  Administered 2014-06-04: 17.6 mg via INTRAVENOUS
  Administered 2014-06-05: 12 mg via INTRAVENOUS
  Administered 2014-06-05: 6.4 mg via INTRAVENOUS
  Administered 2014-06-05: 4.5 mg via INTRAVENOUS
  Administered 2014-06-05: 50 mg via INTRAVENOUS
  Administered 2014-06-05: 16.5 mg via INTRAVENOUS
  Administered 2014-06-06: 3.2 mg via INTRAVENOUS
  Administered 2014-06-06: 6.2 mg via INTRAVENOUS
  Administered 2014-06-06: 4.4 mg via INTRAVENOUS
  Administered 2014-06-06: 5.6 mg via INTRAVENOUS
  Filled 2014-06-03 (×4): qty 25

## 2014-06-03 MED ORDER — ACETAMINOPHEN 650 MG RE SUPP: 650.0000 mg | Freq: Four times a day (QID) | RECTAL | Status: DC | PRN

## 2014-06-03 MED ORDER — DILTIAZEM HCL ER COATED BEADS 120 MG PO CP24
120.0000 mg | ORAL_CAPSULE | Freq: Every day | ORAL | Status: DC
  Administered 2014-06-03 – 2014-06-06 (×4): 120 mg via ORAL
  Filled 2014-06-03 (×4): qty 1

## 2014-06-03 MED ORDER — KETOROLAC TROMETHAMINE 30 MG/ML IJ SOLN
30.0000 mg | Freq: Once | INTRAMUSCULAR | Status: AC
  Administered 2014-06-03: 30 mg via INTRAMUSCULAR
  Filled 2014-06-03: qty 1

## 2014-06-03 MED ORDER — SODIUM CHLORIDE 0.9 % IV SOLN
INTRAVENOUS | Status: DC
  Administered 2014-06-04: 02:00:00 via INTRAVENOUS

## 2014-06-03 MED ORDER — LISINOPRIL 10 MG PO TABS
10.0000 mg | ORAL_TABLET | Freq: Every day | ORAL | Status: DC
  Administered 2014-06-03 – 2014-06-06 (×4): 10 mg via ORAL
  Filled 2014-06-03 (×4): qty 1

## 2014-06-03 MED ORDER — HYDROMORPHONE HCL 1 MG/ML IJ SOLN
1.0000 mg | Freq: Once | INTRAMUSCULAR | Status: AC
  Administered 2014-06-03: 1 mg via INTRAVENOUS
  Filled 2014-06-03: qty 1

## 2014-06-03 MED ORDER — ONDANSETRON HCL 4 MG/2ML IJ SOLN: 4.0000 mg | Freq: Four times a day (QID) | INTRAMUSCULAR | Status: DC | PRN

## 2014-06-03 MED ORDER — VITAMIN D3 25 MCG (1000 UNIT) PO TABS
2000.0000 [IU] | ORAL_TABLET | Freq: Every day | ORAL | Status: DC
  Administered 2014-06-03 – 2014-06-06 (×4): 2000 [IU] via ORAL
  Filled 2014-06-03 (×4): qty 2

## 2014-06-03 MED ORDER — POTASSIUM CHLORIDE CRYS ER 20 MEQ PO TBCR
20.0000 meq | EXTENDED_RELEASE_TABLET | Freq: Every morning | ORAL | Status: DC
  Administered 2014-06-03 – 2014-06-06 (×4): 20 meq via ORAL
  Filled 2014-06-03 (×5): qty 1

## 2014-06-03 MED ORDER — DIPHENHYDRAMINE HCL 12.5 MG/5ML PO ELIX
12.5000 mg | ORAL_SOLUTION | Freq: Four times a day (QID) | ORAL | Status: DC | PRN
  Filled 2014-06-03: qty 5

## 2014-06-03 MED ORDER — SODIUM CHLORIDE 0.9 % IJ SOLN: 9.0000 mL | INTRAMUSCULAR | Status: DC | PRN

## 2014-06-03 MED ORDER — FENTANYL CITRATE (PF) 100 MCG/2ML IJ SOLN
100.0000 ug | Freq: Once | INTRAMUSCULAR | Status: AC
  Administered 2014-06-03: 100 ug via INTRAVENOUS

## 2014-06-03 MED ORDER — SODIUM CHLORIDE 0.9 % IV SOLN
12.5000 mg | Freq: Four times a day (QID) | INTRAVENOUS | Status: DC | PRN
  Administered 2014-06-03 – 2014-06-04 (×3): 12.5 mg via INTRAVENOUS
  Filled 2014-06-03 (×6): qty 0.25

## 2014-06-03 MED ORDER — SODIUM CHLORIDE 0.9 % IJ SOLN
3.0000 mL | Freq: Two times a day (BID) | INTRAMUSCULAR | Status: DC
Start: 2014-06-03 — End: 2014-06-06
  Administered 2014-06-03 – 2014-06-05 (×4): 3 mL via INTRAVENOUS

## 2014-06-03 MED ORDER — SODIUM CHLORIDE 0.9 % IV SOLN
1000.0000 mL | INTRAVENOUS | Status: DC
  Administered 2014-06-03: 1000 mL via INTRAVENOUS

## 2014-06-03 MED ORDER — NALOXONE HCL 0.4 MG/ML IJ SOLN: 0.4000 mg | INTRAMUSCULAR | Status: DC | PRN

## 2014-06-03 MED ORDER — SODIUM CHLORIDE 0.9 % IV SOLN
1000.0000 mL | Freq: Once | INTRAVENOUS | Status: AC
  Administered 2014-06-03: 1000 mL via INTRAVENOUS

## 2014-06-03 MED ORDER — FOLIC ACID 1 MG PO TABS
1.0000 mg | ORAL_TABLET | Freq: Every morning | ORAL | Status: DC
  Administered 2014-06-03 – 2014-06-06 (×4): 1 mg via ORAL
  Filled 2014-06-03 (×4): qty 1

## 2014-06-03 MED ORDER — FENTANYL CITRATE (PF) 100 MCG/2ML IJ SOLN
INTRAMUSCULAR | Status: AC
  Filled 2014-06-03: qty 2

## 2014-06-03 MED ORDER — DIPHENHYDRAMINE HCL 50 MG/ML IJ SOLN
25.0000 mg | Freq: Once | INTRAMUSCULAR | Status: AC
  Administered 2014-06-03: 25 mg via INTRAVENOUS
  Filled 2014-06-03: qty 1

## 2014-06-03 MED ORDER — HYDROXYUREA 500 MG PO CAPS: 1500.0000 mg | ORAL_CAPSULE | Freq: Every day | ORAL | Status: DC

## 2014-06-03 MED ORDER — RIVAROXABAN 20 MG PO TABS
20.0000 mg | ORAL_TABLET | Freq: Every morning | ORAL | Status: DC
  Administered 2014-06-03 – 2014-06-06 (×4): 20 mg via ORAL
  Filled 2014-06-03 (×4): qty 1

## 2014-06-03 MED ORDER — METOPROLOL TARTRATE 25 MG PO TABS
25.0000 mg | ORAL_TABLET | Freq: Every day | ORAL | Status: DC
  Administered 2014-06-03 – 2014-06-06 (×4): 25 mg via ORAL
  Filled 2014-06-03 (×4): qty 1

## 2014-06-03 NOTE — ED Provider Notes (Signed)
Patient care assumed from Florene Glen, NP at shift change. Please see her note for further.   Patient with history of sickle cell who presents with left shoulder pain and bilateral rib cage pain since yesterday and worse today. Patient had received two rounds of dilaudid. Plan was recheck and possible admission.  At my evaluation he is complaining of 7/10 bilateral rib cage pain and left shoulder pain. Will repeat dilaudid and consult sickle cell medicine.  White count is 15.1. Hemoglobin is 6.0 with a HCT of 17.5. Reticulocyte count is 10.2. CXR is unremarkable.  Patient is on 3 L oxygen via [place] and is having periods of hypoxia. When I walked in the room his oxygen saturation was 88% on 3 L. After he sat down and was upped to 4 L his oxygen saturation improved to 94%. Consulted with Dr. Zigmund Daniel from sickle cell clinic who would like me to consult hospitalist for admission. Dr. Clementeen Graham accepted the patient for admission to telemetry.   Dx:  #1: Sickle cell pain crisis #2: Hypoxia.   This patient was discussed with Dr. Betsey Holiday who agrees with assessment and plan.   Results for orders placed or performed during the hospital encounter of 06/03/14  CBC WITH DIFFERENTIAL  Result Value Ref Range   WBC 15.1 (H) 4.0 - 10.5 K/uL   RBC 1.77 (L) 4.22 - 5.81 MIL/uL   Hemoglobin 6.0 (LL) 13.0 - 17.0 g/dL   HCT 17.5 (L) 39.0 - 52.0 %   MCV 98.9 78.0 - 100.0 fL   MCH 33.9 26.0 - 34.0 pg   MCHC 34.3 30.0 - 36.0 g/dL   RDW 24.9 (H) 11.5 - 15.5 %   Platelets 408 (H) 150 - 400 K/uL   Neutrophils Relative % 63 43 - 77 %   Lymphocytes Relative 19 12 - 46 %   Monocytes Relative 15 (H) 3 - 12 %   Eosinophils Relative 2 0 - 5 %   Basophils Relative 1 0 - 1 %   Neutro Abs 9.4 (H) 1.7 - 7.7 K/uL   Lymphs Abs 2.9 0.7 - 4.0 K/uL   Monocytes Absolute 2.3 (H) 0.1 - 1.0 K/uL   Eosinophils Absolute 0.3 0.0 - 0.7 K/uL   Basophils Absolute 0.2 (H) 0.0 - 0.1 K/uL   RBC Morphology POLYCHROMASIA PRESENT    Comprehensive metabolic panel  Result Value Ref Range   Sodium 133 (L) 135 - 145 mmol/L   Potassium 3.6 3.5 - 5.1 mmol/L   Chloride 108 101 - 111 mmol/L   CO2 21 (L) 22 - 32 mmol/L   Glucose, Bld 125 (H) 70 - 99 mg/dL   BUN 12 6 - 20 mg/dL   Creatinine, Ser 0.69 0.61 - 1.24 mg/dL   Calcium 8.4 (L) 8.9 - 10.3 mg/dL   Total Protein 7.4 6.5 - 8.1 g/dL   Albumin 3.9 3.5 - 5.0 g/dL   AST 38 15 - 41 U/L   ALT 22 17 - 63 U/L   Alkaline Phosphatase 79 38 - 126 U/L   Total Bilirubin 4.8 (H) 0.3 - 1.2 mg/dL   GFR calc non Af Amer >60 >60 mL/min   GFR calc Af Amer >60 >60 mL/min   Anion gap 4 (L) 5 - 15  Reticulocytes  Result Value Ref Range   Retic Ct Pct 10.2 (H) 0.4 - 3.1 %   RBC. 1.77 (L) 4.22 - 5.81 MIL/uL   Retic Count, Manual 180.5 19.0 - 186.0 K/uL   *Note: Due to  a large number of results and/or encounters for the requested time period, some results have not been displayed. A complete set of results can be found in Results Review.   Dg Chest 2 View  06/03/2014   CLINICAL DATA:  Sickle cell crisis.  Dyspnea.  Left shoulder pain.  EXAM: CHEST  2 VIEW  COMPARISON:  05/28/2014  FINDINGS: There is a left subclavian Port-A-Cath with tip in the low SVC. Heart size is unchanged, moderately enlarged. There is mild chronic interstitial coarsening. There is no acute airspace opacity. There is no effusion. There is no pneumothorax. There is no significant interval change.  IMPRESSION: No acute findings.   Electronically Signed   By: Andreas Newport M.D.   On: 06/03/2014 05:16   Dg Chest 2 View  05/28/2014   CLINICAL DATA:  Bilateral lower rib pain and productive cough, sickle cell patient.  EXAM: CHEST  2 VIEW  COMPARISON:  05/22/2014 and 04/29/2014.  FINDINGS: Trachea is midline. Heart is enlarged. Left subclavian Port-A-Cath terminates in the high right atrium. Scattered pleural parenchymal scarring. No new areas of consolidation. No pleural fluid.  IMPRESSION: No acute findings.    Electronically Signed   By: Lorin Picket M.D.   On: 05/28/2014 18:40   Dg Chest 2 View  05/22/2014   CLINICAL DATA:  Dry cough for 2 days.  EXAM: CHEST  2 VIEW  COMPARISON:  04/29/2014  FINDINGS: Left subclavian porta catheter, tip at the upper cavoatrial junction.  Stable mild cardiomegaly.  Stable aortic and hilar contours.  Scattered interstitial opacity consistent with mild scarring. There is no edema, consolidation, effusion, or pneumothorax.  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  IMPRESSION: 1. No active cardiopulmonary disease. 2. Cardiomegaly and mild lung scarring.   Electronically Signed   By: Monte Fantasia M.D.   On: 05/22/2014 04:44      Waynetta Pean, PA-C 06/03/14 7001  Orpah Greek, MD 06/03/14 7494  Orpah Greek, MD 06/03/14 1012

## 2014-06-03 NOTE — Progress Notes (Signed)
Received report from ED RN, Pt arrived unit, alert and oriented, MD notified of Pt's location. Will continue with current plan of care.

## 2014-06-03 NOTE — H&P (Signed)
[facility] History and Physical  [name] WNI:[id number] DOB: Jun 15, 1980 DOA: 06/03/2014  Referring physician: Waynetta Pean, PA PCP: [name],[name], MD   Chief Complaint:  B/l ribs and left shoulder pain x 1 day   HPI:  34 year old male with history of sickle cell anemia with frequent hospitalization for pain crisis, history of PE and A. fib on Xarelto, hypertension who presented to the ED with bilateral rib cage pain and left shoulder pain since yesterday that became worse this morning. He reports taking his home pain medications without much relief. In the ED patient was hypoxic in the 80s on room air and improved to mid 90s on O2 2.5 L via nasal cannula. Patient reports pain severity of 9/10 on presentation which improved only to 7/10 after 2 rounds of IV Dilaudid. Blood work done showed WBC of 15 (has chronic leukocytosis), hemoglobin of 6 (baseline hemoglobin of 6-7) normal chemistry and reticulocyte count of 10.2. Dr. Zigmund Daniel with sickle cell was consulted who recommended admission by hospitalist and will round on him tomorrow.  Patient refused to stay in the hospital stating that he ends up staying in the hospital for several days. He was finally convinced that he needed to be in the hospital for aggressive pain control with PCA pump and monitor his hypoxia and he finally agreed.  patient denies any sick contacts or recent illness but reports nonproductive cough since yesterday.Patient denies headache, dizziness, fever, chills, nausea , vomiting, chest pain, palpitations, SOB, abdominal pain, bowel or urinary symptoms. Denies change in weight or appetite.  Review of Systems:  As outlined in history of present illness. Otherwise 12 point review of systems unremarkable.    Past Medical History  Diagnosis Date  . Sickle cell anemia   . Blood transfusion   . Acute embolism and thrombosis of right internal jugular vein   . Hypokalemia   . Mood disorder   . History  of pulmonary embolus (PE)   . Avascular necrosis   . Leukocytosis     Chronic  . Thrombocytosis     Chronic  . Hypertension   . History of Clostridium difficile infection   . Uses marijuana   . Chronic anticoagulation   . Functional asplenia   . Former smoker   . Second hand tobacco smoke exposure   . Alcohol consumption of one to four drinks per day   . Noncompliance with medication regimen   . Sickle-cell crisis with associated acute chest syndrome 05/13/2013  . Acute chest syndrome 06/18/2013  . Demand ischemia 01/02/2014   Past Surgical History  Procedure Laterality Date  . Right hip replacement      08/2006  . Cholecystectomy      01/2008  . Porta cath placement    . Porta cath removal    . Umbilical hernia repair      01/2008  . Excision of left periauricular cyst      10/2009  . Excision of right ear lobe cyst with primary closur      11/2007  . Portacath placement  01/05/2012    Procedure: INSERTION PORT-A-CATH;  Surgeon: Odis Hollingshead, MD;  Location: [place];  Service: General;  Laterality: N/A;  ultrasound guiced port a cath insertion with fluoroscopy   Social History:  reports that he quit smoking about 3 years ago. He has never used smokeless tobacco. He reports that he uses illicit drugs (Marijuana) about twice per week. He reports that he does not drink alcohol.  No  Known Allergies  Family History  Problem Relation Age of Onset  . Sickle cell trait Mother   . Depression Mother   . Diabetes Mother   . Sickle cell trait Father   . Sickle cell trait Brother     Prior to Admission medications   Medication Sig Start Date End Date Taking? Authorizing Provider  Cholecalciferol (VITAMIN D) 2000 UNITS tablet Take 1 tablet (2,000 Units total) by mouth daily. 02/13/14  Yes [name], MD  diltiazem (CARDIZEM CD) 120 MG 24 hr capsule Take 1 capsule (120 mg total) by mouth daily. 03/13/14  Yes Costin Karlyne Greenspan, MD  folic acid (FOLVITE) 1 MG tablet Take 1  tablet (1 mg total) by mouth every morning. 10/08/13  Yes [name], MD  HYDROmorphone (DILAUDID) 4 MG tablet Take 1 tablet (4 mg total) by mouth every 4 (four) hours as needed for severe pain. 05/02/14  Yes [name], MD  hydroxyurea (HYDREA) 500 MG capsule Take 3 capsules (1,500 mg total) by mouth daily. May take with food to minimize GI side effects. 10/30/13  Yes [name], MD  lisinopril (PRINIVIL,ZESTRIL) 10 MG tablet Take 1 tablet (10 mg total) by mouth daily. 01/22/14  Yes [name], MD  metoprolol tartrate (LOPRESSOR) 25 MG tablet Take 1 tablet (25 mg total) by mouth daily. 08/22/13  Yes [name], MD  morphine (MS CONTIN) 30 MG 12 hr tablet Take 1 tablet (30 mg total) by mouth every 12 (twelve) hours. 05/21/14  Yes [name], MD  potassium chloride SA (K-DUR,KLOR-CON) 20 MEQ tablet Take 1 tablet (20 mEq total) by mouth every morning. 09/25/13  Yes [name], MD  rivaroxaban (XARELTO) 20 MG TABS tablet Take 1 tablet (20 mg total) by mouth every morning. 03/13/14  Yes Costin Karlyne Greenspan, MD  zolpidem (AMBIEN) 10 MG tablet Take 1 tablet (10 mg total) by mouth at bedtime as needed for sleep. 04/01/14  Yes [name], MD  clotrimazole (MYCELEX) 10 MG troche Take 1 tablet (10 mg total) by mouth 5 (five) times daily. Patient not taking: Reported on 05/27/2014 05/26/14 06/04/14  Doy Hutching, MD     Physical Exam:  Filed Vitals:   06/03/14 0800 06/03/14 0814 06/03/14 0830 06/03/14 0900  BP: 115/63     Pulse:   106 103  Temp:      TempSrc:      Resp:      Height:      Weight:      SpO2:  93% 97% 95%    Constitutional: Vital signs reviewed.  Middle aged male lying in bed appears restless with painHEENT: pallor present,  no icterus, moist oral mucosa, no cervical lymphadenopathy, supple neck  Cardiovascular: RRR, S1 normal, S2 normal, no MRG Chest: CTAB, no wheezes, rales, or rhonchi GI: Soft. Non-tender, non-distended,  bowel sounds are normal,   musculoskeletal: Tender to pressure over bilateral ribs and left shoulder Neurological: A&O x3, non focal  Labs on Admission:  Basic Metabolic Panel:  Recent Labs Lab 05/27/14 2049 05/28/14 0151 05/29/14 0455 05/30/14 0350 06/03/14 0413  NA 136 135 140 139 133*  K 3.0* 3.3* 3.7 4.2 3.6  CL 107 106 112 112 108  CO2 21 21 21 22  21*  GLUCOSE 118* 133* 137* 98 125*  BUN 5* 7 9 10 12   CREATININE 0.64 0.69 0.63 0.64 0.69  CALCIUM 9.0 8.7 8.5 8.7 8.4*  MG  --  1.5  --   --   --  Liver Function Tests:  Recent Labs Lab 05/27/14 2049 05/28/14 0151 05/29/14 0455 05/30/14 0350 06/03/14 0413  AST 43* 40* 28 28 38  ALT 24 23 23 24 22   ALKPHOS 74 73 74 75 79  BILITOT 4.7* 5.2* 3.1* 3.6* 4.8*  PROT 7.5 7.8 6.6 7.2 7.4  ALBUMIN 4.1 4.3 3.8 3.9 3.9   No results for input(s): LIPASE, AMYLASE in the last 168 hours. No results for input(s): AMMONIA in the last 168 hours. CBC:  Recent Labs Lab 05/27/14 2049 05/28/14 0151 05/29/14 0455 05/30/14 0350 06/03/14 0413  WBC 21.2* 21.0* 17.2* 17.5* 15.1*  NEUTROABS 14.6* 12.2* 11.6* 12.2* 9.4*  HGB 7.2* 7.1* 6.2* 6.6* 6.0*  HCT 20.5* 20.6* 18.2* 19.4* 17.5*  MCV 98.1 99.0 102.2* 101.6* 98.9  PLT 415* 412* 346 390 408*   Cardiac Enzymes: No results for input(s): CKTOTAL, CKMB, CKMBINDEX, TROPONINI in the last 168 hours. BNP: Invalid input(s): POCBNP CBG: No results for input(s): GLUCAP in the last 168 hours.  Radiological Exams on Admission: Dg Chest 2 View  06/03/2014   CLINICAL DATA:  Sickle cell crisis.  Dyspnea.  Left shoulder pain.  EXAM: CHEST  2 VIEW  COMPARISON:  05/28/2014  FINDINGS: There is a left subclavian Port-A-Cath with tip in the low SVC. Heart size is unchanged, moderately enlarged. There is mild chronic interstitial coarsening. There is no acute airspace opacity. There is no effusion. There is no pneumothorax. There is no significant interval change.  IMPRESSION: No acute findings.    Electronically Signed   By: Andreas Newport M.D.   On: 06/03/2014 05:16    EKG:  pending   Assessment/Plan  Principal Problem:   Sickle cell anemia with crisis Admit to telemetry. Patient received IV Dilaudid in the ED without much clinical improvement. We'll start him on Dilaudid PCA. IV Benadryl when necessary for itching. Continue hydroxyurea and folic acid. IV hydration with normal saline. We'll order when necessary Zofran for nausea or vomiting Discussed with Dr. Zigmund Daniel and since hemoglobin around baseline with indication for transfusion.  Active Problems:  Hypoxia Likely due to atelectasis associated with severe rib pain. Chest x-ray unremarkable. Continue O2 via nasal cannula. Ordered flutter valve    Hx of pulmonary embolus On Xarelto which is continued.  A. fib Rate controlled. Continue Cardizem and Xarelto  Essential hypertension Stable. Continue home medications   diet: Regular   DVT prophylaxis: On Xarelto    Code Status: full code Family Communication:  None at bedside Disposition Plan:    admit to telemetry. Sickle cell team will resume caring morning   Myriam Brandhorst, [place] [facility] Pager [phone]  Total time spent on admission :60 minutes  If 7PM-7AM, please contact night-coverage [email or web address] Password [facility] 06/03/2014, 9:23 AM

## 2014-06-03 NOTE — Progress Notes (Signed)
PHARMACY BRIEF NOTE: HYDROXYUREA   By [facility] policy, hydroxyurea is automatically held when any of the following laboratory values occur:  ANC < 2 K  Pltc < 80K in sickle-cell patients; < 100K in other patients  Hgb <= 6 in sickle-cell patients; < 8 in other patients (6.0) Reticulocytes < 80K when Hgb < 9  Hydroxyurea has been held (discontinued from profile) per policy.   Reuel Boom, PharmD Pager: [phone] 06/03/2014, 11:12 AM

## 2014-06-03 NOTE — ED Notes (Signed)
Pt reports that he was able to get half of his prescriptions filled, since previous visits.

## 2014-06-03 NOTE — ED Notes (Signed)
Patient reports he has had side and back pain since last night, but pain worsened and woke him from sleep about an hour PTA.

## 2014-06-04 ENCOUNTER — Inpatient Hospital Stay (HOSPITAL_COMMUNITY): Payer: Medicare Other

## 2014-06-04 DIAGNOSIS — M87022 Idiopathic aseptic necrosis of left humerus: Secondary | ICD-10-CM

## 2014-06-04 DIAGNOSIS — D599 Acquired hemolytic anemia, unspecified: Secondary | ICD-10-CM

## 2014-06-04 DIAGNOSIS — D5700 Hb-SS disease with crisis, unspecified: Principal | ICD-10-CM

## 2014-06-04 DIAGNOSIS — R0902 Hypoxemia: Secondary | ICD-10-CM | POA: Diagnosis not present

## 2014-06-04 DIAGNOSIS — D638 Anemia in other chronic diseases classified elsewhere: Secondary | ICD-10-CM

## 2014-06-04 DIAGNOSIS — M25512 Pain in left shoulder: Secondary | ICD-10-CM | POA: Diagnosis not present

## 2014-06-04 LAB — DIFFERENTIAL
BASOS ABS: 0 10*3/uL (ref 0.0–0.1)
Basophils Relative: 0 % (ref 0–1)
Eosinophils Absolute: 0.2 10*3/uL (ref 0.0–0.7)
Eosinophils Relative: 1 % (ref 0–5)
Lymphocytes Relative: 15 % (ref 12–46)
Lymphs Abs: 2.5 10*3/uL (ref 0.7–4.0)
MONOS PCT: 14 % — AB (ref 3–12)
Monocytes Absolute: 2.4 10*3/uL — ABNORMAL HIGH (ref 0.1–1.0)
NEUTROS ABS: 11.8 10*3/uL — AB (ref 1.7–7.7)
NEUTROS PCT: 70 % (ref 43–77)
nRBC: 2 /100 WBC — ABNORMAL HIGH

## 2014-06-04 LAB — BASIC METABOLIC PANEL
Anion gap: 6 (ref 5–15)
BUN: 10 mg/dL (ref 6–20)
CALCIUM: 8.7 mg/dL — AB (ref 8.9–10.3)
CO2: 21 mmol/L — ABNORMAL LOW (ref 22–32)
CREATININE: 0.64 mg/dL (ref 0.61–1.24)
Chloride: 111 mmol/L (ref 101–111)
GFR calc non Af Amer: >60 mL/min (ref >60)
Glucose, Bld: 113 mg/dL — ABNORMAL HIGH (ref 70–99)
Potassium: 4 mmol/L (ref 3.5–5.1)
Sodium: 138 mmol/L (ref 135–145)

## 2014-06-04 LAB — CBC
HEMATOCRIT: 17.4 % — AB (ref 39.0–52.0)
Hemoglobin: 6 g/dL — CL (ref 13.0–17.0)
MCH: 34.9 pg — ABNORMAL HIGH (ref 26.0–34.0)
MCHC: 34.5 g/dL (ref 30.0–36.0)
MCV: 101.2 fL — ABNORMAL HIGH (ref 78.0–100.0)
PLATELETS: 464 10*3/uL — AB (ref 150–400)
RBC: 1.72 MIL/uL — ABNORMAL LOW (ref 4.22–5.81)
RDW: 24.6 % — ABNORMAL HIGH (ref 11.5–15.5)
WBC: 16.9 10*3/uL — AB (ref 4.0–10.5)

## 2014-06-04 LAB — PREPARE RBC (CROSSMATCH)

## 2014-06-04 LAB — LACTATE DEHYDROGENASE: LDH: 487 U/L — ABNORMAL HIGH (ref 98–192)

## 2014-06-04 MED ORDER — DEXTROSE-NACL 5-0.45 % IV SOLN
INTRAVENOUS | Status: DC
  Administered 2014-06-04: 75 mL/h via INTRAVENOUS
  Administered 2014-06-05: 04:00:00 via INTRAVENOUS

## 2014-06-04 MED ORDER — SODIUM CHLORIDE 0.9 % IJ SOLN
10.0000 mL | INTRAMUSCULAR | Status: DC | PRN
  Administered 2014-06-04 – 2014-06-06 (×4): 10 mL
  Filled 2014-06-04 (×3): qty 40

## 2014-06-04 MED ORDER — MORPHINE SULFATE ER 30 MG PO TBCR
30.0000 mg | EXTENDED_RELEASE_TABLET | Freq: Two times a day (BID) | ORAL | Status: DC
  Administered 2014-06-04 – 2014-06-06 (×5): 30 mg via ORAL
  Filled 2014-06-04 (×6): qty 1

## 2014-06-04 MED ORDER — SODIUM CHLORIDE 0.9 % IV SOLN: 1000.0000 mL | INTRAVENOUS | Status: DC

## 2014-06-04 MED ORDER — SODIUM CHLORIDE 0.9 % IV SOLN: Freq: Once | INTRAVENOUS | Status: DC

## 2014-06-04 MED ORDER — DIPHENHYDRAMINE HCL 25 MG PO CAPS
25.0000 mg | ORAL_CAPSULE | Freq: Four times a day (QID) | ORAL | Status: DC | PRN
  Administered 2014-06-04: 25 mg via ORAL
  Filled 2014-06-04: qty 1
  Filled 2014-06-04: qty 2

## 2014-06-04 MED ORDER — DIPHENHYDRAMINE HCL 50 MG/ML IJ SOLN
25.0000 mg | INTRAMUSCULAR | Status: DC | PRN
  Administered 2014-06-05: 25 mg via INTRAVENOUS
  Filled 2014-06-04 (×4): qty 0.5

## 2014-06-04 NOTE — Progress Notes (Signed)
SICKLE CELL SERVICE PROGRESS NOTE  [name] LEX:[id number] DOB: August 19, 1980 DOA: 06/03/2014 PCP: [name],[name], MD  Assessment/Plan: Principal Problem:   Sickle cell anemia with crisis Active Problems:   Hx of pulmonary embolus   Chronic anticoagulation   Chronic pain syndrome   Rib pain   Hypoxia   Atrial fibrillation  1. Hb SS with crisis: Pt reports pain improved except in left shoulder. He has used 38.4 mg 68/64:demands/deliveries in the last 24 hours. He will be resumed on MS Contin and continued on Toradol and IVF. Will also obtian x-ray of the left shoulder. 2. Hypoxemia:Pt continues to require up to 3L/min of supplemental oxygen. I will transfuse 1 Unit RBC's for symptomatic anemia. 3. Leukocytosis: No evidence of infection. This is likely related to crisis and increased bone marrow activity.  4. Anemia of chronic disease with acute hemolysis: Will transfuse 1 unit RBC's. 5. Chronic pain: Pt is on MS Contin but has no Dilaudid for acute pain as his insurance did not cover it and he is unable to afford it. Will make sure that he has his Dilaudid prescription prior to discharge.   Code Status: Full Code Family Communication: N/A Disposition Plan: Not yet ready for discharge  [place].  Pager [phone]. If 7PM-7AM, please contact night-coverage.  06/04/2014, 4:12 PM  LOS: 1 day    Consultants:  None  Procedures:  None  Antibiotics:  Non  HPI/Subjective: Pt reports that  pain is improved but localized mostly to the left shoulder.  Objective:  06/04/14 0800 06/04/14 1200 06/04/14 1241  BP:     Pulse:     Temp:     TempSrc:     Resp: 18 18 19   Height:     Weight:     SpO2: 92% 93% 91%   Weight change:   Intake/Output Summary (Last 24 hours) at 06/04/14 1612 Last data filed at 06/04/14 0954  Gross per 24 hour  Intake    530 ml  Output   1150 ml  Net   -620 ml    General: Alert, awake, oriented x3, in no acute distress.  HEENT:  [place]/AT PEERL, EOMI Neck: Trachea midline,  no masses, no thyromegal,y no JVD, no carotid bruit OROPHARYNX:  Moist, No exudate/ erythema/lesions.  Heart: Regular rate and rhythm, without murmurs, rubs, gallops, PMI non-displaced, no heaves or thrills on palpation.  Lungs: Clear to auscultation, no wheezing or rhonchi noted. No increased vocal fremitus resonant to percussion  Abdomen: Soft, nontender, nondistended, positive bowel sounds, no masses no hepatosplenomegaly noted..  Neuro: No focal neurological deficits noted cranial nerves II through XII grossly intact. DTRs 2+ bilaterally upper and lower extremities. Strength 5 out of 5 in bilateral upper and lower extremities. Musculoskeletal: No warm swelling or erythema around joints, no spinal tenderness noted. Psychiatric: Patient alert and oriented x3, good insight and cognition, good recent to remote recall. Lymph node survey: No cervical axillary or inguinal lymphadenopathy noted.   Data Reviewed: Basic Metabolic Panel:  Recent Labs Lab 05/29/14 0455 05/30/14 0350 06/03/14 0413 06/04/14 1030  NA 140 139 133* 138  K 3.7 4.2 3.6 4.0  CL 112 112 108 111  CO2 21 22 21* 21*  GLUCOSE 137* 98 125* 113*  BUN 9 10 12 10   CREATININE 0.63 0.64 0.69 0.64  CALCIUM 8.5 8.7 8.4* 8.7*   Liver Function Tests:  Recent Labs Lab 05/29/14 0455 05/30/14 0350 06/03/14 0413  AST 28 28 38  ALT 23 24 22  ALKPHOS 74 75 79  BILITOT 3.1* 3.6* 4.8*  PROT 6.6 7.2 7.4  ALBUMIN 3.8 3.9 3.9   No results for input(s): LIPASE, AMYLASE in the last 168 hours. No results for input(s): AMMONIA in the last 168 hours. CBC:  Recent Labs Lab 05/29/14 0455 05/30/14 0350 06/03/14 0413 06/04/14 0430  WBC 17.2* 17.5* 15.1* 16.9*  NEUTROABS 11.6* 12.2* 9.4* 11.8*  HGB 6.2* 6.6* 6.0* 6.0*  HCT 18.2* 19.4* 17.5* 17.4*  MCV 102.2* 101.6* 98.9 101.2*  PLT 346 390 408* 464*   Cardiac Enzymes: No results for input(s): CKTOTAL, CKMB, CKMBINDEX, TROPONINI in  the last 168 hours. BNP (last 3 results) No results for input(s): BNP in the last 8760 hours.  ProBNP (last 3 results)  Recent Labs  01/01/14 0909  PROBNP 5790.0*    CBG: No results for input(s): GLUCAP in the last 168 hours.  No results found for this or any previous visit (from the past 240 hour(s)).   Studies: Dg Chest 2 View  06/03/2014   CLINICAL DATA:  Sickle cell crisis.  Dyspnea.  Left shoulder pain.  EXAM: CHEST  2 VIEW  COMPARISON:  05/28/2014  FINDINGS: There is a left subclavian Port-A-Cath with tip in the low SVC. Heart size is unchanged, moderately enlarged. There is mild chronic interstitial coarsening. There is no acute airspace opacity. There is no effusion. There is no pneumothorax. There is no significant interval change.  IMPRESSION: No acute findings.   Electronically Signed   By: Andreas Newport M.D.   On: 06/03/2014 05:16   Dg Chest 2 View  05/28/2014   CLINICAL DATA:  Bilateral lower rib pain and productive cough, sickle cell patient.  EXAM: CHEST  2 VIEW  COMPARISON:  05/22/2014 and 04/29/2014.  FINDINGS: Trachea is midline. Heart is enlarged. Left subclavian Port-A-Cath terminates in the high right atrium. Scattered pleural parenchymal scarring. No new areas of consolidation. No pleural fluid.  IMPRESSION: No acute findings.   Electronically Signed   By: Lorin Picket M.D.   On: 05/28/2014 18:40     Scheduled Meds: . cholecalciferol  2,000 Units Oral Daily  . diltiazem  120 mg Oral Daily  . folic acid  1 mg Oral q morning - 10a  . HYDROmorphone PCA 2 mg/mL   Intravenous 6 times per day  . lisinopril  10 mg Oral Daily  . metoprolol tartrate  25 mg Oral Daily  . morphine  30 mg Oral Q12H  . potassium chloride SA  20 mEq Oral q morning - 10a  . rivaroxaban  20 mg Oral q morning - 10a  . sodium chloride  3 mL Intravenous Q12H   Continuous Infusions: . dextrose 5 % and 0.45% NaCl 75 mL/hr (06/04/14 1455)    Time spent 30  minutes

## 2014-06-05 DIAGNOSIS — M25512 Pain in left shoulder: Secondary | ICD-10-CM | POA: Diagnosis not present

## 2014-06-05 DIAGNOSIS — D5700 Hb-SS disease with crisis, unspecified: Secondary | ICD-10-CM | POA: Diagnosis not present

## 2014-06-05 LAB — TYPE AND SCREEN
ABO/RH(D): O POS
ANTIBODY SCREEN: NEGATIVE
Unit division: 0

## 2014-06-05 LAB — CBC
HEMATOCRIT: 19 % — AB (ref 39.0–52.0)
Hemoglobin: 6.7 g/dL — CL (ref 13.0–17.0)
MCH: 34.5 pg — ABNORMAL HIGH (ref 26.0–34.0)
MCHC: 35.3 g/dL (ref 30.0–36.0)
MCV: 97.9 fL (ref 78.0–100.0)
Platelets: 554 10*3/uL — ABNORMAL HIGH (ref 150–400)
RBC: 1.94 MIL/uL — ABNORMAL LOW (ref 4.22–5.81)
RDW: 22.9 % — ABNORMAL HIGH (ref 11.5–15.5)
WBC: 15.7 10*3/uL — AB (ref 4.0–10.5)

## 2014-06-05 MED ORDER — HYDROMORPHONE HCL 4 MG PO TABS
4.0000 mg | ORAL_TABLET | ORAL | Status: DC
  Administered 2014-06-05 – 2014-06-06 (×6): 4 mg via ORAL
  Filled 2014-06-05 (×2): qty 1
  Filled 2014-06-05 (×2): qty 2
  Filled 2014-06-05: qty 1
  Filled 2014-06-05: qty 2
  Filled 2014-06-05: qty 1

## 2014-06-05 MED ORDER — KETOROLAC TROMETHAMINE 30 MG/ML IJ SOLN
30.0000 mg | Freq: Four times a day (QID) | INTRAMUSCULAR | Status: DC
Start: 2014-06-05 — End: 2014-06-06
  Administered 2014-06-05 – 2014-06-06 (×4): 30 mg via INTRAVENOUS
  Filled 2014-06-05 (×8): qty 1

## 2014-06-05 NOTE — Progress Notes (Signed)
Subjective: [name] admitted with sickle cell disease and hypoxemia. Has been on Dilaudid PCA and has used 38 mg with 50 demands and 47 deliveries. He denied any shortness of breath, no cough no nausea vomiting or diarrhea. His pain is currently at 7 out of 10. It is mainly in his right shoulder and left shoulder more than the right as well as his lower back. He has done better than where he was 2 days ago.  Objective: Vital signs in last 24 hours: Temp:  [98.5 F (36.9 C)-99.3 F (37.4 C)] 98.6 F (37 C) (05/05 0956) Pulse Rate:  [79-100] 79 (05/05 0956) Resp:  [16-20] 20 (05/05 0956) BP: (109-137)/(68-85) 115/77 mmHg (05/05 0956) SpO2:  [91 %-98 %] 98 % (05/05 0956) FiO2 (%):  [31 %] 31 % (05/05 0757) Weight change:  Last BM Date: 06/03/14  Intake/Output from previous day: 05/04 0701 - 05/05 0700 In: 1142 [P.O.:840; Blood:302] Out: 1675 [Urine:1675] Intake/Output this shift: Total I/O In: -  Out: 500 [Urine:500]  General appearance: alert, cooperative, appears stated age and no distress Eyes: conjunctivae/corneas clear. PERRL, EOM's intact. Fundi benign. Neck: no adenopathy, no carotid bruit, no JVD, supple, symmetrical, trachea midline and thyroid not enlarged, symmetric, no tenderness/mass/nodules Back: symmetric, no curvature. ROM normal. No CVA tenderness. Resp: clear to auscultation bilaterally Chest wall: no tenderness Cardio: regular rate and rhythm, S1, S2 normal, no murmur, click, rub or gallop GI: soft, non-tender; bowel sounds normal; no masses,  no organomegaly Extremities: extremities normal, atraumatic, no cyanosis or edema Pulses: 2+ and symmetric Skin: Skin color, texture, turgor normal. No rashes or lesions Neurologic: Grossly normal  Lab Results:  Recent Labs  06/04/14 0430 06/05/14 0825  WBC 16.9* 15.7*  HGB 6.0* 6.7*  HCT 17.4* 19.0*  PLT 464* 554*   BMET  Recent Labs  06/03/14 0413 06/04/14 1030  NA 133* 138  K 3.6 4.0  CL 108 111   CO2 21* 21*  GLUCOSE 125* 113*  BUN 12 10  CREATININE 0.69 0.64  CALCIUM 8.4* 8.7*    Studies/Results: Dg Shoulder Left  06/04/2014   CLINICAL DATA:  LEFT shoulder pain for 2-3 days, sickle cell disease, no known injury  EXAM: LEFT SHOULDER - 2+ VIEW  COMPARISON:  10/25/2012  FINDINGS: AC joint alignment normal.  Sclerosis identified sat uperior aspect of the LEFT humeral head consistent with avascular necrosis.  Pattern appears similar to that seen on the previous exam.  No acute fracture, dislocation or bone destruction.  No humeral head collapse identified.  LEFT subclavian Port-A-Cath visualized.  No acute LEFT rib abnormalities noted.  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  No acute abnormalities.   Electronically Signed   By: Lavonia Dana M.D.   On: 06/04/2014 13:57    Medications: I have reviewed the patient's current medications.  Assessment/Plan: A 34 year old gentleman here with sickle cell painful crisis.  #1 sickle cell painful crisis: Patient is doing well on his current PCA however he is not on his home regimen. I will start him on his oral medications with Toradol which he is not on right now as well as close monitoring. He has avascular necrosis of the left humerus which is why the pain is getting worse. He should be ready to for discharge in the next 2448 hrs.  #2 sickle cell anemia: Patient's hemoglobin is stable close to his baseline after transfusion of one unit packed red blood cells. We will monitored overnight.  #  3 chronic anticoagulation: We will continue with his home Xarelto.  #4 leukocytosis: Due to sickle cell crisis. Continue to monitor.  LOS: 2 days   Kathlyn Leachman,[name] 06/05/2014, 10:45 AM

## 2014-06-06 ENCOUNTER — Other Ambulatory Visit: Payer: Self-pay | Admitting: Internal Medicine

## 2014-06-06 ENCOUNTER — Telehealth: Payer: Self-pay | Admitting: Internal Medicine

## 2014-06-06 DIAGNOSIS — D5700 Hb-SS disease with crisis, unspecified: Secondary | ICD-10-CM | POA: Diagnosis not present

## 2014-06-06 DIAGNOSIS — G894 Chronic pain syndrome: Secondary | ICD-10-CM

## 2014-06-06 DIAGNOSIS — M25512 Pain in left shoulder: Secondary | ICD-10-CM | POA: Diagnosis not present

## 2014-06-06 DIAGNOSIS — G4700 Insomnia, unspecified: Secondary | ICD-10-CM

## 2014-06-06 DIAGNOSIS — D571 Sickle-cell disease without crisis: Secondary | ICD-10-CM

## 2014-06-06 LAB — COMPREHENSIVE METABOLIC PANEL
ALBUMIN: 3.8 g/dL (ref 3.5–5.0)
ALT: 25 U/L (ref 17–63)
AST: 41 U/L (ref 15–41)
Alkaline Phosphatase: 73 U/L (ref 38–126)
Anion gap: 5 (ref 5–15)
BILIRUBIN TOTAL: 3.8 mg/dL — AB (ref 0.3–1.2)
BUN: 6 mg/dL (ref 6–20)
CO2: 22 mmol/L (ref 22–32)
Calcium: 8.4 mg/dL — ABNORMAL LOW (ref 8.9–10.3)
Chloride: 110 mmol/L (ref 101–111)
Creatinine, Ser: 0.55 mg/dL — ABNORMAL LOW (ref 0.61–1.24)
GFR calc Af Amer: >60 mL/min (ref >60)
GFR calc non Af Amer: >60 mL/min (ref >60)
Glucose, Bld: 113 mg/dL — ABNORMAL HIGH (ref 70–99)
POTASSIUM: 3.8 mmol/L (ref 3.5–5.1)
Sodium: 137 mmol/L (ref 135–145)
Total Protein: 7.1 g/dL (ref 6.5–8.1)

## 2014-06-06 LAB — CBC WITH DIFFERENTIAL/PLATELET
BASOS PCT: 1 % (ref 0–1)
Basophils Absolute: 0.1 10*3/uL (ref 0.0–0.1)
EOS PCT: 3 % (ref 0–5)
Eosinophils Absolute: 0.4 10*3/uL (ref 0.0–0.7)
HEMATOCRIT: 19.1 % — AB (ref 39.0–52.0)
HEMOGLOBIN: 6.6 g/dL — AB (ref 13.0–17.0)
LYMPHS ABS: 1.6 10*3/uL (ref 0.7–4.0)
Lymphocytes Relative: 11 % — ABNORMAL LOW (ref 12–46)
MCH: 34.6 pg — AB (ref 26.0–34.0)
MCHC: 34.6 g/dL (ref 30.0–36.0)
MCV: 100 fL (ref 78.0–100.0)
Monocytes Absolute: 2.2 10*3/uL — ABNORMAL HIGH (ref 0.1–1.0)
Monocytes Relative: 15 % — ABNORMAL HIGH (ref 3–12)
NEUTROS ABS: 10.5 10*3/uL — AB (ref 1.7–7.7)
NRBC: 5 /100{WBCs} — AB
Neutrophils Relative %: 70 % (ref 43–77)
Platelets: 560 10*3/uL — ABNORMAL HIGH (ref 150–400)
RBC: 1.91 MIL/uL — AB (ref 4.22–5.81)
RDW: 23.7 % — ABNORMAL HIGH (ref 11.5–15.5)
WBC: 14.8 10*3/uL — ABNORMAL HIGH (ref 4.0–10.5)

## 2014-06-06 MED ORDER — HYDROMORPHONE HCL 4 MG PO TABS: 4.0000 mg | ORAL_TABLET | ORAL | Status: DC | PRN

## 2014-06-06 MED ORDER — HEPARIN SOD (PORK) LOCK FLUSH 100 UNIT/ML IV SOLN
500.0000 [IU] | INTRAVENOUS | Status: AC | PRN
  Administered 2014-06-06: 500 [IU]

## 2014-06-06 MED ORDER — ZOLPIDEM TARTRATE 10 MG PO TABS: 10.0000 mg | ORAL_TABLET | Freq: Every evening | ORAL | Status: DC | PRN

## 2014-06-06 MED ORDER — MORPHINE SULFATE ER 30 MG PO TBCR: 30.0000 mg | EXTENDED_RELEASE_TABLET | Freq: Two times a day (BID) | ORAL | Status: DC

## 2014-06-06 NOTE — Telephone Encounter (Signed)
Refill request for ambien 10mg , Ms Contin 30mg  and dilaudid 4mg . LOV 02/13/2014. Please advise. Thanks!

## 2014-06-06 NOTE — Progress Notes (Signed)
Prescription written for Dilaudid 4 mg #90 tabs. NCCSRS reviewed and no inconsistencies noted. Pt recently evaluated in the hospital.

## 2014-06-06 NOTE — Progress Notes (Signed)
Patient discharge home with friend, alert and oriented, discharge instructions given verbally, patient verbalize understanding of discharge instructions given, patient stated," I have to be at work and need to go," his ride in room waiting to take patient home, patient stable for discharge home

## 2014-06-06 NOTE — Care Management Note (Signed)
Case Management Note  Patient Details  Name: BRAXTEN MEMMER MRN: [id number] Date of Birth: 1980/04/15  Subjective/Objective:     34 yo male admitted with SCC               Action/Plan: Consulted for medications assistance. Patient stated that he is working with the [facility] to get his Medicaid reinstated but until then he is not able to afford the cost of his medications. He stated that the MS Contin is over $100 and the dilaudid is over $60. Discussed the [facility] website and reviewed with patient on computer in the room. With the GoodRx coupon the dilaudid is $16 and the Ms contin is $42. Printed the coupons for the patient per his request and explained that he can upload the app to his smart phone to have for future prescriptions until his Medicaid is reinstated. Patient stated that he can afford the medications with the coupons, and he is ware that they are for [place]. No other questions or concerns.  Expected Discharge Date:   (unknown)               Expected Discharge Plan:  Home/Self Care  In-House Referral:     Discharge planning Services  CM Consult, Medication Assistance  Post Acute Care Choice:    Choice offered to:     DME Arranged:    DME Agency:     HH Arranged:    [place]:     Status of Service:  Completed, signed off  Medicare Important Message Given:  Yes Date Medicare IM Given:  06/06/14 Medicare IM give by:  Leanne Chang Date Additional Medicare IM Given:    Additional Medicare Important Message give by:     If discussed at [place] of Stay Meetings, dates discussed:    Additional Comments:  Scot Dock, RN 06/06/2014, 11:24 AM

## 2014-06-06 NOTE — Discharge Summary (Signed)
Sickle-Cell Discharge Summary  [name] TML:[id number] DOB: [birth year] DOA: 06/03/2014  PCP: [name],[name], MD  Admit date: 06/03/2014 Discharge date: 06/07/2014  Discharge Diagnoses:  Principal Problem:   Sickle cell anemia with crisis Active Problems:   Hx of pulmonary embolus   Chronic anticoagulation   Chronic pain syndrome   Rib pain   Hypoxia   Atrial fibrillation   Discharge Condition: improved/stable  Disposition:      Follow-up Information    Follow up with [name],[name], MD In 1 week.   Specialty:  Internal Medicine   Why:  As needed   Contact information:   [place] [address] [phone]       Diet:regular  Wt Readings from Last 3 Encounters:  06/03/14 163 lb (73.936 kg)  05/28/14 159 lb 6.3 oz (72.3 kg)  05/26/14 163 lb (73.936 kg)    History of present illness:  34 year old male with history of sickle cell anemia with frequent hospitalization for pain crisis, history of PE and A. fib on Xarelto, hypertension who presented to the ED with bilateral rib cage pain and left shoulder pain since yesterday that became worse this morning. He reports taking his home pain medications without much relief. In the ED patient was hypoxic in the 80s on room air and improved to mid 90s on O2 2.5 L via nasal cannula. Patient reports pain severity of 9/10 on presentation which improved only to 7/10 after 2 rounds of IV Dilaudid. Blood work done showed WBC of 15 (has chronic leukocytosis), hemoglobin of 6 (baseline hemoglobin of 6-7) normal chemistry and reticulocyte count of 10.2. Dr. West Pugh with sickle cell was consulted who recommended admission by hospitalist and will round on him tomorrow. Patient refused to stay in the hospital stating that he ends up staying in the hospital for several days. He was finally convinced that he needed to be in the hospital for aggressive pain control with PCA pump and monitor his hypoxia and he  finally agreed.  patient denies any sick contacts or recent illness but reports nonproductive cough since yesterday.Patient denies headache, dizziness, fever, chills, nausea , vomiting, chest pain, palpitations, SOB, abdominal pain, bowel or urinary symptoms. Denies change in weight or appetite.  Hospital Course by problem:  Sickle Cell Anemia with Crisis: Patient presented with pain characteristic of acute vaso-occlusive crisis. Pt's pain was treated with bolus IV analgesics initially and was later transitioned with a Dilaudid PCA and ketorolac. As his pain improved, Dilaudid PCA was titrated down and he was started on his home dose of Dilaudid. His pain was well controlled with his oral home regimen without much need for PCA. In addition, he was transfused 1 unit PRBC for a hgb of 6.0 which raised his Hgb to his baseline. He had overall improvement of his pain and was physically functional upon discharge.   Discharge Exam: Filed Vitals:   06/06/14 1517  BP: 118/79  Pulse: 85  Temp: 98.5 F (36.9 C)  Resp: 16   Filed Vitals:   06/06/14 0616 06/06/14 0800 06/06/14 1200 06/06/14 1517  BP: 112/72   118/79  Pulse: 78   85  Temp: 98.3 F (36.8 C)   98.5 F (36.9 C)  TempSrc: Oral   Oral  Resp: 14 14 14 16   Height:      Weight:      SpO2: 97% 97% 97% 90%   General: Alert, awake, oriented x3, in no acute distress.  HEENT: [employer]/AT [place]ER[place],  EOMI Neck: Trachea midline, no masses, no JVD, no carotid bruit, no LAD OROPHARYNX: Moist, No exudate/ erythema/lesions.  Heart: Regular rate and rhythm, without murmurs, rubs, gallops Lungs: Clear to auscultation, no wheezing or rhonchi noted.  Abdomen: Soft, nontender, nondistended, positive bowel sounds, no masses no hepatosplenomegaly noted. Neuro: No focal neurological deficits noted cranial nerves II through XII grossly intact. Strength 5 out of 5 in bilateral upper and lower extremities. Nl gait, ambulating without difficulty. Musculoskeletal:  No warm swelling or erythema around joints, no spinal tenderness noted. Extremities: No c/c/e   Discharge Instructions As above     Medication List    STOP taking these medications        clotrimazole 10 MG troche  Commonly known as:  MYCELEX      TAKE these medications        diltiazem 120 MG 24 hr capsule  Commonly known as:  CARDIZEM CD  Take 1 capsule (120 mg total) by mouth daily.     folic acid 1 MG tablet  Commonly known as:  FOLVITE  Take 1 tablet (1 mg total) by mouth every morning.     HYDROmorphone 4 MG tablet  Commonly known as:  DILAUDID  Take 1 tablet (4 mg total) by mouth every 4 (four) hours as needed for severe pain.     hydroxyurea 500 MG capsule  Commonly known as:  HYDREA  Take 3 capsules (1,500 mg total) by mouth daily. May take with food to minimize GI side effects.     lisinopril 10 MG tablet  Commonly known as:  PRINIVIL,ZESTRIL  Take 1 tablet (10 mg total) by mouth daily.     metoprolol tartrate 25 MG tablet  Commonly known as:  LOPRESSOR  Take 1 tablet (25 mg total) by mouth daily.     morphine 30 MG 12 hr tablet  Commonly known as:  MS CONTIN  Take 1 tablet (30 mg total) by mouth every 12 (twelve) hours.     potassium chloride SA 20 MEQ tablet  Commonly known as:  K-DUR,KLOR-CON  Take 1 tablet (20 mEq total) by mouth every morning.     rivaroxaban 20 MG Tabs tablet  Commonly known as:  XARELTO  Take 1 tablet (20 mg total) by mouth every morning.     Vitamin D 2000 UNITS tablet  Take 1 tablet (2,000 Units total) by mouth daily.     zolpidem 10 MG tablet  Commonly known as:  AMBIEN  Take 1 tablet (10 mg total) by mouth at bedtime as needed for sleep.          The results of significant diagnostics from this hospitalization (including imaging, microbiology, ancillary and laboratory) are listed below for reference.    Significant Diagnostic Studies: Dg Chest 2 View  06/03/2014   CLINICAL DATA:  Sickle cell crisis.  Dyspnea.   Left shoulder pain.  EXAM: CHEST  2 VIEW  COMPARISON:  05/28/2014  FINDINGS: There is a left subclavian Port-A-Cath with tip in the low SVC. Heart size is unchanged, moderately enlarged. There is mild chronic interstitial coarsening. There is no acute airspace opacity. There is no effusion. There is no pneumothorax. There is no significant interval change.  IMPRESSION: No acute findings.   Electronically Signed   By: Andreas Newport M.D.   On: 06/03/2014 05:16   Dg Chest 2 View  05/28/2014   CLINICAL DATA:  Bilateral lower rib pain and productive cough, sickle cell patient.  EXAM: CHEST  2  VIEW  COMPARISON:  05/22/2014 and 04/29/2014.  FINDINGS: Trachea is midline. Heart is enlarged. Left subclavian Port-A-Cath terminates in the high right atrium. Scattered pleural parenchymal scarring. No new areas of consolidation. No pleural fluid.  IMPRESSION: No acute findings.   Electronically Signed   By: Lorin Picket M.D.   On: 05/28/2014 18:40   Dg Chest 2 View  05/22/2014   CLINICAL DATA:  Dry cough for 2 days.  EXAM: CHEST  2 VIEW  COMPARISON:  04/29/2014  FINDINGS: Left subclavian porta catheter, tip at the upper cavoatrial junction.  Stable mild cardiomegaly.  Stable aortic and hilar contours.  Scattered interstitial opacity consistent with mild scarring. There is no edema, consolidation, effusion, or pneumothorax.  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  IMPRESSION: 1. No active cardiopulmonary disease. 2. Cardiomegaly and mild lung scarring.   Electronically Signed   By: Monte Fantasia M.D.   On: 05/22/2014 04:44   Dg Shoulder Left  06/04/2014   CLINICAL DATA:  LEFT shoulder pain for 2-3 days, sickle cell disease, no known injury  EXAM: LEFT SHOULDER - 2+ VIEW  COMPARISON:  10/25/2012  FINDINGS: AC joint alignment normal.  Sclerosis identified sat uperior aspect of the LEFT humeral head consistent with avascular necrosis.  Pattern appears similar to that seen on the previous exam.   No acute fracture, dislocation or bone destruction.  No humeral head collapse identified.  LEFT subclavian Port-A-Cath visualized.  No acute LEFT rib abnormalities noted.  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  No acute abnormalities.   Electronically Signed   By: Lavonia Dana M.D.   On: 06/04/2014 13:57    Microbiology: No results found for this or any previous visit (from the past 240 hour(s)).   Labs: Basic Metabolic Panel:  Recent Labs Lab 06/03/14 0413 06/04/14 1030 06/06/14 0621  NA 133* 138 137  K 3.6 4.0 3.8  CL 108 111 110  CO2 21* 21* 22  GLUCOSE 125* 113* 113*  BUN 12 10 6   CREATININE 0.69 0.64 0.55*  CALCIUM 8.4* 8.7* 8.4*   Liver Function Tests:  Recent Labs Lab 06/03/14 0413 06/06/14 0621  AST 38 41  ALT 22 25  ALKPHOS 79 73  BILITOT 4.8* 3.8*  PROT 7.4 7.1  ALBUMIN 3.9 3.8   No results for input(s): LIPASE, AMYLASE in the last 168 hours. No results for input(s): AMMONIA in the last 168 hours. CBC:  Recent Labs Lab 06/03/14 0413 06/04/14 0430 06/05/14 0825 06/06/14 0621  WBC 15.1* 16.9* 15.7* 14.8*  NEUTROABS 9.4* 11.8*  --  10.5*  HGB 6.0* 6.0* 6.7* 6.6*  HCT 17.5* 17.4* 19.0* 19.1*  MCV 98.9 101.2* 97.9 100.0  PLT 408* 464* 554* 560*   Cardiac Enzymes: No results for input(s): CKTOTAL, CKMB, CKMBINDEX, TROPONINI in the last 168 hours.  Time coordinating discharge: >30 min  Signed:  Kalman Shan Sickle Cell Service 06/07/2014, 2:37 PM

## 2014-06-06 NOTE — Progress Notes (Addendum)
Patient alert and sleeping peacefully in bed with eyes closed easy to arouse, morning medication given without any difficulty, pain level 5/10, PCA result for 24 hour period are as follows: Total Drug = 36.8.mg Total Demand = 47 Delivery = 46  Patient in stable condition at this time will continue to monitor patient for remainder of shift

## 2014-06-06 NOTE — Telephone Encounter (Signed)
Patient will need to return to clinic in 1 month for medication management. Reviewed [place] Substance Reporting system prior to reorder, no inconsistencies noted. Patient received a partial prescription for MS Contin on 05/21/2014 due to financial constraints.   Meds ordered this encounter  Medications  . zolpidem (AMBIEN) 10 MG tablet    Sig: Take 1 tablet (10 mg total) by mouth at bedtime as needed for sleep.    Dispense:  30 tablet    Refill:  0    Order Specific Question:  Supervising Provider    Answer:  [name], MICHELLE A [[id number]]  . morphine (MS CONTIN) 30 MG 12 hr tablet    Sig: Take 1 tablet (30 mg total) by mouth every 12 (twelve) hours.    Dispense:  60 tablet    Refill:  0    Order Specific Question:  Supervising Provider    Answer:  Liston Alba A [[id number]]    Dorena Dew, FNP

## 2014-06-09 ENCOUNTER — Other Ambulatory Visit: Payer: Self-pay | Admitting: Internal Medicine

## 2014-06-09 MED ORDER — POTASSIUM CHLORIDE CRYS ER 20 MEQ PO TBCR: 20.0000 meq | EXTENDED_RELEASE_TABLET | Freq: Every morning | ORAL | Status: DC

## 2014-06-09 NOTE — Telephone Encounter (Signed)
Potassium refilled and sent into pharmacy. Thanks!

## 2014-06-11 IMAGING — CR DG CHEST 2V
2 series · 2 of 2 positions shown · non-contrast
Comparison: 07/07/2011; 06/22/2011; 05/30/2011; chest CT -
04/25/2011

CLINICAL DATA: Sickle cell crisis

CHEST - 2 VIEW

[w chest pa]
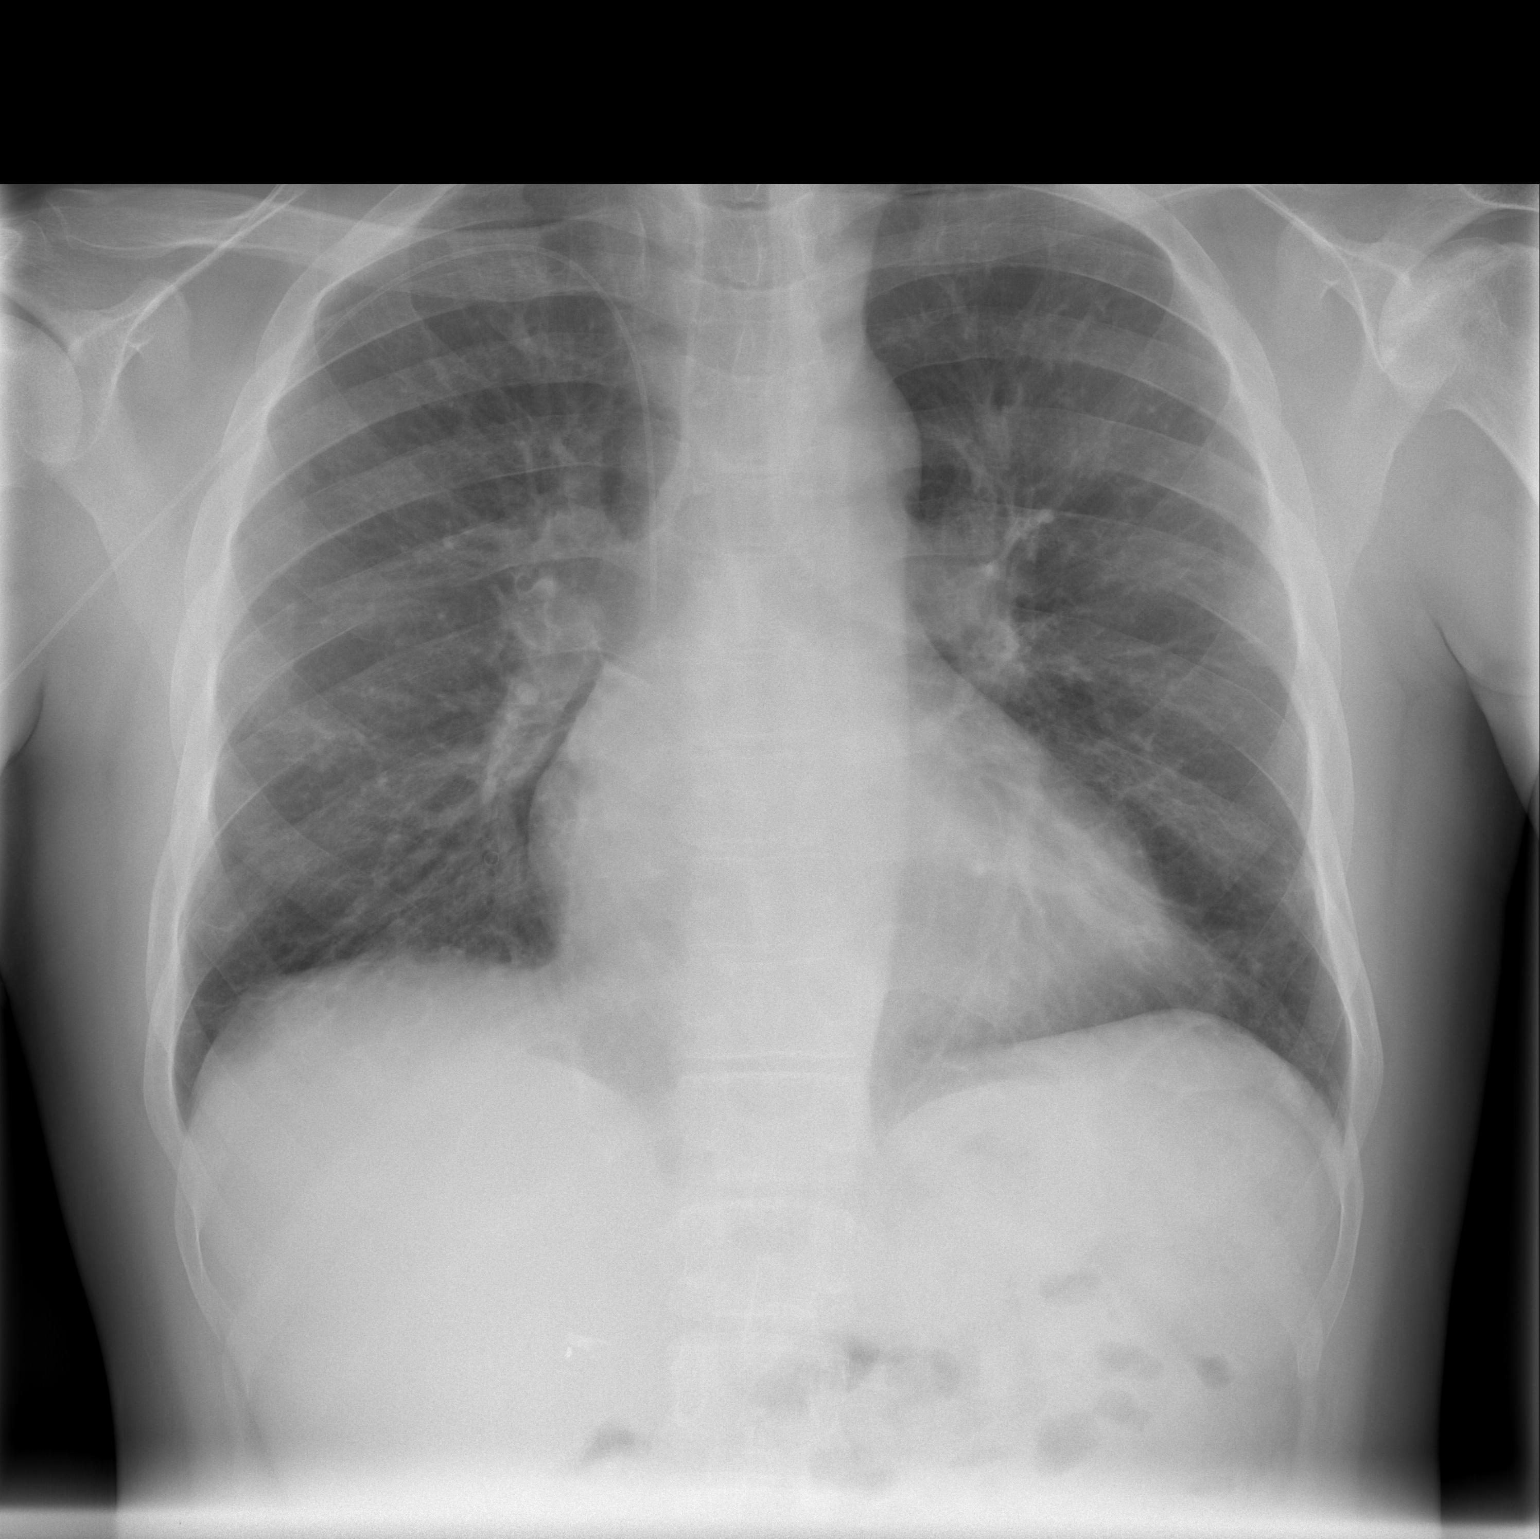

[w chest lat]
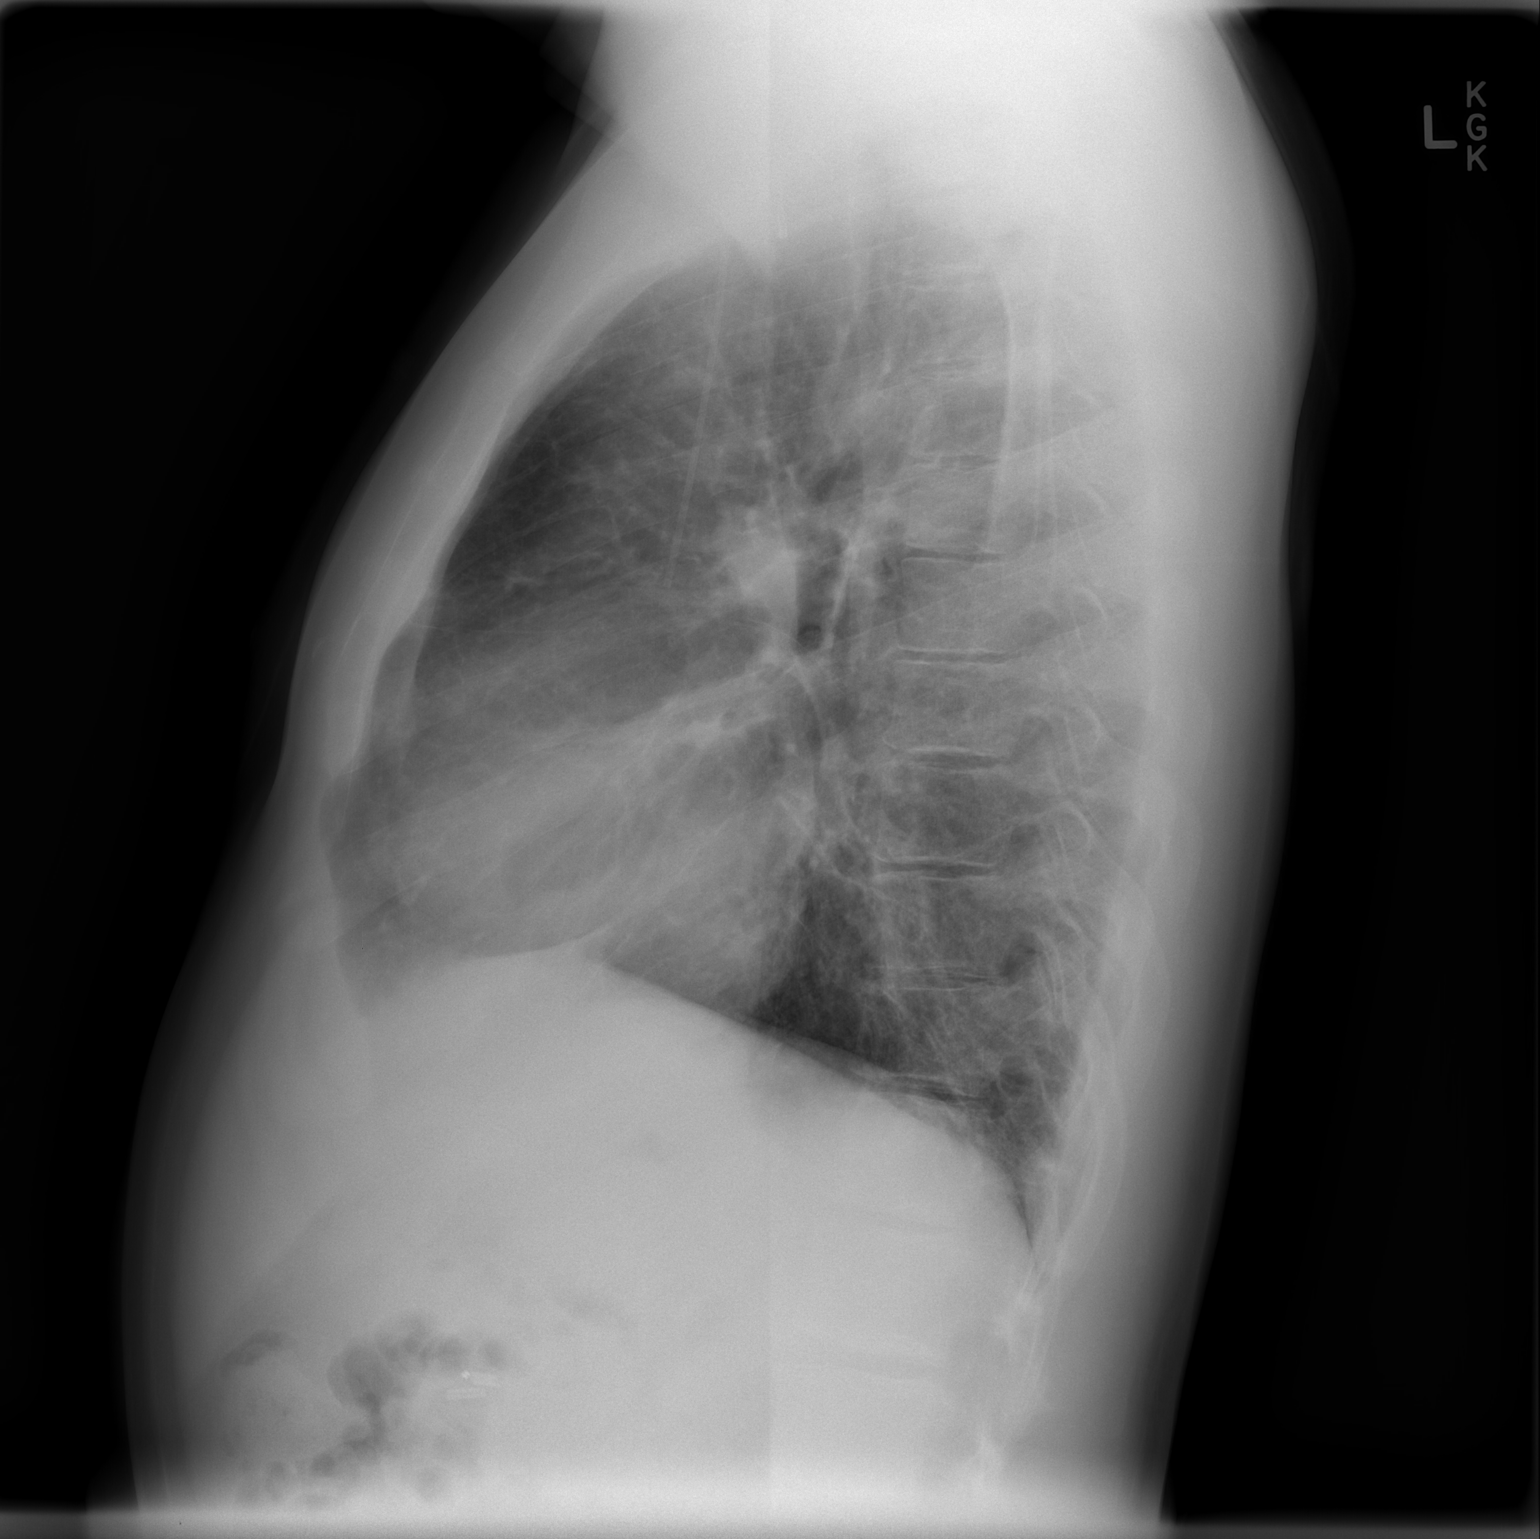

[2 of 2 positions shown; findings below may reference images not displayed]

FINDINGS: Grossly unchanged cardiac silhouette and mediastinal
contours.  Stable positioning of support apparatus.  There is mild
diffuse increased conspicuity of the pulmonary interstitium.  No
new focal airspace opacities.  No pleural effusion or pneumothorax.
Grossly unchanged bones.  Post cholecystectomy.
IMPRESSION: Chronic interstitial/bronchitic change without acute
cardiopulmonary disease.

## 2014-06-12 ENCOUNTER — Non-Acute Institutional Stay (HOSPITAL_COMMUNITY)
Admission: AD | Admit: 2014-06-12 | Discharge: 2014-06-12 | Disposition: A | Discharge status: Home / Self Care | Payer: Medicare Other | Attending: Internal Medicine | Admitting: Internal Medicine

## 2014-06-12 ENCOUNTER — Encounter (HOSPITAL_COMMUNITY): Payer: Self-pay | Admitting: *Deleted

## 2014-06-12 DIAGNOSIS — I10 Essential (primary) hypertension: Secondary | ICD-10-CM | POA: Diagnosis not present

## 2014-06-12 DIAGNOSIS — Z87891 Personal history of nicotine dependence: Secondary | ICD-10-CM | POA: Insufficient documentation

## 2014-06-12 DIAGNOSIS — Z79899 Other long term (current) drug therapy: Secondary | ICD-10-CM | POA: Diagnosis not present

## 2014-06-12 DIAGNOSIS — Z7901 Long term (current) use of anticoagulants: Secondary | ICD-10-CM | POA: Insufficient documentation

## 2014-06-12 DIAGNOSIS — Z79891 Long term (current) use of opiate analgesic: Secondary | ICD-10-CM | POA: Insufficient documentation

## 2014-06-12 DIAGNOSIS — D5700 Hb-SS disease with crisis, unspecified: Secondary | ICD-10-CM | POA: Diagnosis not present

## 2014-06-12 DIAGNOSIS — M549 Dorsalgia, unspecified: Secondary | ICD-10-CM | POA: Diagnosis present

## 2014-06-12 DIAGNOSIS — Z86711 Personal history of pulmonary embolism: Secondary | ICD-10-CM | POA: Insufficient documentation

## 2014-06-12 LAB — CBC WITH DIFFERENTIAL/PLATELET
BASOS PCT: 0 % (ref 0–1)
Band Neutrophils: 0 % (ref 0–10)
Basophils Absolute: 0 10*3/uL (ref 0.0–0.1)
Blasts: 0 %
EOS PCT: 1 % (ref 0–5)
Eosinophils Absolute: 0.1 10*3/uL (ref 0.0–0.7)
HCT: 20 % — ABNORMAL LOW (ref 39.0–52.0)
HEMOGLOBIN: 7.1 g/dL — AB (ref 13.0–17.0)
Lymphocytes Relative: 14 % (ref 12–46)
Lymphs Abs: 1.6 10*3/uL (ref 0.7–4.0)
MCH: 36 pg — AB (ref 26.0–34.0)
MCHC: 35.5 g/dL (ref 30.0–36.0)
MCV: 101.5 fL — ABNORMAL HIGH (ref 78.0–100.0)
METAMYELOCYTES PCT: 0 %
MYELOCYTES: 0 %
Monocytes Absolute: 1.5 10*3/uL — ABNORMAL HIGH (ref 0.1–1.0)
Monocytes Relative: 13 % — ABNORMAL HIGH (ref 3–12)
NEUTROS ABS: 8 10*3/uL — AB (ref 1.7–7.7)
Neutrophils Relative %: 72 % (ref 43–77)
Other: 0 %
PLATELETS: 662 10*3/uL — AB (ref 150–400)
Promyelocytes Absolute: 0 %
RBC: 1.97 MIL/uL — ABNORMAL LOW (ref 4.22–5.81)
RDW: 23.1 % — ABNORMAL HIGH (ref 11.5–15.5)
WBC: 11.2 10*3/uL — ABNORMAL HIGH (ref 4.0–10.5)
nRBC: 8 /100 WBC — ABNORMAL HIGH

## 2014-06-12 LAB — COMPREHENSIVE METABOLIC PANEL
ALBUMIN: 3.8 g/dL (ref 3.5–5.0)
ALK PHOS: 77 U/L (ref 38–126)
ALT: 21 U/L (ref 17–63)
AST: 31 U/L (ref 15–41)
Anion gap: 9 (ref 5–15)
BUN: 9 mg/dL (ref 6–20)
CALCIUM: 8.7 mg/dL — AB (ref 8.9–10.3)
CO2: 22 mmol/L (ref 22–32)
Chloride: 104 mmol/L (ref 101–111)
Creatinine, Ser: 0.59 mg/dL — ABNORMAL LOW (ref 0.61–1.24)
GFR calc Af Amer: >60 mL/min (ref >60)
GFR calc non Af Amer: >60 mL/min (ref >60)
Glucose, Bld: 102 mg/dL — ABNORMAL HIGH (ref 65–99)
POTASSIUM: 4 mmol/L (ref 3.5–5.1)
SODIUM: 135 mmol/L (ref 135–145)
TOTAL PROTEIN: 7.6 g/dL (ref 6.5–8.1)
Total Bilirubin: 4.7 mg/dL — ABNORMAL HIGH (ref 0.3–1.2)

## 2014-06-12 LAB — RETICULOCYTES
RBC.: 1.97 MIL/uL — AB (ref 4.22–5.81)
RETIC CT PCT: 11.9 % — AB (ref 0.4–3.1)
Retic Count, Absolute: 234.4 10*3/uL — ABNORMAL HIGH (ref 19.0–186.0)

## 2014-06-12 LAB — LACTATE DEHYDROGENASE: LDH: 386 U/L — ABNORMAL HIGH (ref 98–192)

## 2014-06-12 MED ORDER — POLYETHYLENE GLYCOL 3350 17 G PO PACK
17.0000 g | PACK | Freq: Every day | ORAL | Status: DC | PRN
Start: 2014-06-12 — End: 2014-06-12

## 2014-06-12 MED ORDER — ONDANSETRON HCL 4 MG/2ML IJ SOLN: 4.0000 mg | Freq: Four times a day (QID) | INTRAMUSCULAR | Status: DC | PRN

## 2014-06-12 MED ORDER — KETOROLAC TROMETHAMINE 30 MG/ML IJ SOLN
30.0000 mg | Freq: Four times a day (QID) | INTRAMUSCULAR | Status: DC
  Administered 2014-06-12: 30 mg via INTRAVENOUS
  Filled 2014-06-12: qty 1

## 2014-06-12 MED ORDER — HEPARIN SOD (PORK) LOCK FLUSH 100 UNIT/ML IV SOLN
500.0000 [IU] | INTRAVENOUS | Status: AC | PRN
  Administered 2014-06-12: 500 [IU]
  Filled 2014-06-12: qty 5

## 2014-06-12 MED ORDER — SENNOSIDES-DOCUSATE SODIUM 8.6-50 MG PO TABS: 1.0000 | ORAL_TABLET | Freq: Two times a day (BID) | ORAL | Status: DC

## 2014-06-12 MED ORDER — SODIUM CHLORIDE 0.9 % IJ SOLN
10.0000 mL | INTRAMUSCULAR | Status: AC | PRN
  Administered 2014-06-12: 10 mL

## 2014-06-12 MED ORDER — FOLIC ACID 1 MG PO TABS: 1.0000 mg | ORAL_TABLET | Freq: Every day | ORAL | Status: DC

## 2014-06-12 MED ORDER — DEXTROSE-NACL 5-0.45 % IV SOLN
INTRAVENOUS | Status: DC
  Administered 2014-06-12: 10:00:00 via INTRAVENOUS

## 2014-06-12 MED ORDER — DIPHENHYDRAMINE HCL 12.5 MG/5ML PO ELIX: 12.5000 mg | ORAL_SOLUTION | Freq: Four times a day (QID) | ORAL | Status: DC | PRN

## 2014-06-12 MED ORDER — NALOXONE HCL 0.4 MG/ML IJ SOLN: 0.4000 mg | INTRAMUSCULAR | Status: DC | PRN

## 2014-06-12 MED ORDER — HYDROMORPHONE 2 MG/ML HIGH CONCENTRATION IV PCA SOLN
INTRAVENOUS | Status: DC
  Administered 2014-06-12: 10:00:00 via INTRAVENOUS
  Administered 2014-06-12: 10 mg via INTRAVENOUS
  Filled 2014-06-12: qty 25

## 2014-06-12 MED ORDER — SODIUM CHLORIDE 0.9 % IV SOLN
12.5000 mg | Freq: Four times a day (QID) | INTRAVENOUS | Status: DC | PRN
  Filled 2014-06-12: qty 0.25

## 2014-06-12 MED ORDER — SODIUM CHLORIDE 0.9 % IJ SOLN: 9.0000 mL | INTRAMUSCULAR | Status: DC | PRN

## 2014-06-12 NOTE — Telephone Encounter (Signed)
Pt called with complaint of 7/10 back and side pain. Pt denies CP, ShOB, N/V/D, priapism, fever. Pt states he took hydromorphone 4 mg an hr ago with no relief. Dr.[name] notified and OK for pt to come in. Pt called back and invited to come into day hospital for evaluation and treatment of pain.  Nataley Bahri, Eustaquio Maize

## 2014-06-12 NOTE — Progress Notes (Signed)
Patient discharged, and removed from pyxis prior to wasting the PCA.  Two RN's verified that 52mls left in syringe at discharge.  Roberto Scales, RN,BSN & Lucien Mons, RN, BSN

## 2014-06-12 NOTE — Discharge Summary (Signed)
Physician Discharge Summary  Patient ID: [name] MRN: [id number] DOB/AGE: [birth year] 34 y.o.  Admit date: 06/12/2014 Discharge date: 06/12/2014  Admission Diagnoses:  Discharge Diagnoses:  Active Problems:   Sickle cell pain crisis   Sickle cell disease with crisis   Discharged Condition: good  Hospital Course: Patient admitted with sickle cell painful crisis. Was treated with IV dilaudid PCA, Toradol and IVF. Pain went down from 8/10 to 4/10. Patient wants to go home. Feels he can function on current regimen. He was discharged to follow up with his PCP. His blood work was largely normal within his range.  Consults: None  Significant Diagnostic Studies: labs: CBC with Diff and CMP checked. All within normal limits  Treatments: IV hydration and analgesia: Dilaudid  Discharge Exam: Blood pressure 107/70, pulse 82, temperature 98.5 F (36.9 C), temperature source Oral, resp. rate 21, height 6' (1.829 m), weight 73.936 kg (163 lb), SpO2 96 %. General appearance: alert, cooperative and no distress Eyes: conjunctivae/corneas clear. PERRL, EOM's intact. Fundi benign. Neck: no adenopathy, no carotid bruit, no JVD, supple, symmetrical, trachea midline and thyroid not enlarged, symmetric, no tenderness/mass/nodules Back: symmetric, no curvature. ROM normal. No CVA tenderness. Resp: clear to auscultation bilaterally Chest wall: no tenderness Cardio: regular rate and rhythm, S1, S2 normal, no murmur, click, rub or gallop GI: soft, non-tender; bowel sounds normal; no masses,  no organomegaly Extremities: extremities normal, atraumatic, no cyanosis or edema Pulses: 2+ and symmetric Skin: Skin color, texture, turgor normal. No rashes or lesions Neurologic: Grossly normal  Disposition: 01-Home or Self Care     Medication List    TAKE these medications        diltiazem 120 MG 24 hr capsule  Commonly known as:  CARDIZEM CD  Take 1 capsule (120 mg total) by mouth daily.     folic acid 1 MG tablet  Commonly known as:  FOLVITE  Take 1 tablet (1 mg total) by mouth every morning.     HYDROmorphone 4 MG tablet  Commonly known as:  DILAUDID  Take 1 tablet (4 mg total) by mouth every 4 (four) hours as needed for severe pain.     hydroxyurea 500 MG capsule  Commonly known as:  HYDREA  Take 3 capsules (1,500 mg total) by mouth daily. May take with food to minimize GI side effects.     lisinopril 10 MG tablet  Commonly known as:  PRINIVIL,ZESTRIL  Take 1 tablet (10 mg total) by mouth daily.     metoprolol tartrate 25 MG tablet  Commonly known as:  LOPRESSOR  Take 1 tablet (25 mg total) by mouth daily.     morphine 30 MG 12 hr tablet  Commonly known as:  MS CONTIN  Take 1 tablet (30 mg total) by mouth every 12 (twelve) hours.     potassium chloride SA 20 MEQ tablet  Commonly known as:  K-DUR,KLOR-CON  Take 1 tablet (20 mEq total) by mouth every morning.     rivaroxaban 20 MG Tabs tablet  Commonly known as:  XARELTO  Take 1 tablet (20 mg total) by mouth every morning.     Vitamin D 2000 UNITS tablet  Take 1 tablet (2,000 Units total) by mouth daily.     zolpidem 10 MG tablet  Commonly known as:  AMBIEN  Take 1 tablet (10 mg total) by mouth at bedtime as needed for sleep.         SignedBarbette Merino 06/12/2014, 1:16 PM

## 2014-06-12 NOTE — Discharge Instructions (Signed)
Sickle Cell Anemia °Sickle cell anemia is a condition where your red blood cells are shaped like sickles. Red blood cells carry oxygen through the body. Sickle-shaped red blood cells do not live as long as normal red blood cells. They also clump together and block blood from flowing through the blood vessels. These things prevent the body from getting enough oxygen. Sickle cell anemia causes organ damage and pain. It also increases the risk of infection. °HOME CARE °· Drink enough fluid to keep your pee (urine) clear or pale yellow. Drink more in hot weather and during exercise. °· Do not smoke. Smoking lowers oxygen levels in the blood. °· Only take over-the-counter or prescription medicines as told by your doctor. °· Take antibiotic medicines as told by your doctor. Make sure you finish them even if you start to feel better. °· Take supplements as told by your doctor. °· Consider wearing a medical alert bracelet. This tells anyone caring for you in an emergency of your condition. °· When traveling, keep your medical information, doctors' names, and the medicines you take with you at all times. °· If you have a fever, do not take fever medicines right away. This could cover up a problem. Tell your doctor. °·  Keep all follow-up visits with your doctor. Sickle cell anemia requires regular medical care. °GET HELP IF: °You have a fever. °GET HELP RIGHT AWAY IF: °· You feel dizzy or faint. °· You have new belly (abdominal) pain, especially on the left side near the stomach area. °· You have a lasting, often uncomfortable and painful erection of the penis (priapism). If it is not treated right away, you will become unable to have sex (impotence). °· You have numbness in your arms or legs or you have a hard time moving them. °· You have a hard time talking. °· You have a fever or lasting symptoms for more than 2-3 days. °· You have a fever and your symptoms suddenly get worse. °· You have signs or symptoms of infection.  These include: °¨ Chills. °¨ Being more tired than normal (lethargy). °¨ Irritability. °¨ Poor eating. °¨ Throwing up (vomiting). °· You have pain that is not helped with medicine. °· You have shortness of breath. °· You have pain in your chest. °· You are coughing up pus-like or bloody mucus. °· You have a stiff neck. °· Your feet or hands swell or have pain. °· Your belly looks bloated. °· Your joints hurt. °MAKE SURE YOU: °· Understand these instructions. °· Will watch your condition. °· Will get help right away if you are not doing well or get worse. °Document Released: 11/07/2012 Document Revised: 06/03/2013 Document Reviewed: 11/07/2012 °Exit[employer]® Patient Information ©2015 [employer]. This information is not intended to replace advice given to you by your health care provider. Make sure you discuss any questions you have with your health care provider. ° °

## 2014-06-12 NOTE — H&P (Signed)
[name] is an 34 y.o. male.   Chief Complaint: Pain in back and legs HPI: [name] with history of sickle cell disease here with sickle cell painful crisis. Patient has been taking his home medications as prescribed. Pain is at 9/10. Worsening not relieved by anything. No nausea or vomiting no Diarrhea.  Past Medical History  Diagnosis Date  . Sickle cell anemia   . Blood transfusion   . Acute embolism and thrombosis of right internal jugular vein   . Hypokalemia   . Mood disorder   . History of pulmonary embolus (PE)   . Avascular necrosis   . Leukocytosis     Chronic  . Thrombocytosis     Chronic  . Hypertension   . History of Clostridium difficile infection   . Uses marijuana   . Chronic anticoagulation   . Functional asplenia   . Former smoker   . Second hand tobacco smoke exposure   . Alcohol consumption of one to four drinks per day   . Noncompliance with medication regimen   . Sickle-cell crisis with associated acute chest syndrome 05/13/2013  . Acute chest syndrome 06/18/2013  . Demand ischemia 01/02/2014    Past Surgical History  Procedure Laterality Date  . Right hip replacement      08/2006  . Cholecystectomy      01/2008  . Porta cath placement    . Porta cath removal    . Umbilical hernia repair      01/2008  . Excision of left periauricular cyst      10/2009  . Excision of right ear lobe cyst with primary closur      11/2007  . Portacath placement  01/05/2012    Procedure: INSERTION PORT-A-CATH;  Surgeon: Odis Hollingshead, MD;  Location: [place];  Service: General;  Laterality: N/A;  ultrasound guiced port a cath insertion with fluoroscopy    Family History  Problem Relation Age of Onset  . Sickle cell trait Mother   . Depression Mother   . Diabetes Mother   . Sickle cell trait Father   . Sickle cell trait Brother    Social History:  reports that he quit smoking about 3 years ago. He has never used smokeless tobacco. He reports that he uses  illicit drugs (Marijuana) about twice per week. He reports that he does not drink alcohol.  Allergies: No Known Allergies  Medications Prior to Admission  Medication Sig Dispense Refill  . Cholecalciferol (VITAMIN D) 2000 UNITS tablet Take 1 tablet (2,000 Units total) by mouth daily. 90 tablet 3  . diltiazem (CARDIZEM CD) 120 MG 24 hr capsule Take 1 capsule (120 mg total) by mouth daily. 30 capsule 1  . folic acid (FOLVITE) 1 MG tablet Take 1 tablet (1 mg total) by mouth every morning. 30 tablet 11  . HYDROmorphone (DILAUDID) 4 MG tablet Take 1 tablet (4 mg total) by mouth every 4 (four) hours as needed for severe pain. 90 tablet 0  . hydroxyurea (HYDREA) 500 MG capsule Take 3 capsules (1,500 mg total) by mouth daily. May take with food to minimize GI side effects. 60 capsule 3  . lisinopril (PRINIVIL,ZESTRIL) 10 MG tablet Take 1 tablet (10 mg total) by mouth daily. 30 tablet 1  . metoprolol tartrate (LOPRESSOR) 25 MG tablet Take 1 tablet (25 mg total) by mouth daily. 30 tablet 6  . morphine (MS CONTIN) 30 MG 12 hr tablet Take 1 tablet (30 mg total) by  mouth every 12 (twelve) hours. 60 tablet 0  . potassium chloride SA (K-DUR,KLOR-CON) 20 MEQ tablet Take 1 tablet (20 mEq total) by mouth every morning. 30 tablet 3  . rivaroxaban (XARELTO) 20 MG TABS tablet Take 1 tablet (20 mg total) by mouth every morning. 30 tablet 2  . zolpidem (AMBIEN) 10 MG tablet Take 1 tablet (10 mg total) by mouth at bedtime as needed for sleep. 30 tablet 0    No results found. However, due to the size of the patient record, not all encounters were searched. Please check Results Review for a complete set of results. No results found.  Review of Systems  Constitutional: Negative.   HENT: Negative.   Eyes: Negative.   Respiratory: Negative.   Cardiovascular: Negative.   Gastrointestinal: Negative.   Genitourinary: Negative.   Musculoskeletal: Positive for myalgias, joint pain and neck pain.  Skin: Negative.    Neurological: Negative.   Endo/Heme/Allergies: Negative.   Psychiatric/Behavioral: Negative.     Blood pressure 115/73, pulse 86, temperature 98.4 F (36.9 C), temperature source Oral, resp. rate 18, height 6' (1.829 m), weight 73.936 kg (163 lb), SpO2 99 %. Physical Exam  Constitutional: He is oriented to person, place, and time. He appears well-developed and well-nourished.  HENT:  Head: Normocephalic and atraumatic.  Right Ear: External ear normal.  Mouth/Throat: Oropharynx is clear and moist.  Eyes: Conjunctivae and EOM are normal. Pupils are equal, round, and reactive to light.  Neck: Normal range of motion. Neck supple.  Cardiovascular: Normal rate, regular rhythm, normal heart sounds and intact distal pulses.   Respiratory: Effort normal and breath sounds normal.  GI: Soft. Bowel sounds are normal.  Musculoskeletal: Normal range of motion. He exhibits tenderness. He exhibits no edema.  Neurological: He is alert and oriented to person, place, and time.  Skin: Skin is warm and dry.  Psychiatric: He has a normal mood and affect.     Assessment/Plan A 34 yo admitted admitted with sickle cell painful crisis.  #1. Sickle Cell Painful crisis: Patient will be placed on Dilaudid PCA, Toradol and IVF. Pain will be reduced to tolerable level before discharged.  #2. Sickle Cell Anemia: H/H stable.   #3. Chronic Anti-coagulation: Continue Xarelto.  [name],[name] 06/12/2014, 9:19 AM

## 2014-06-19 IMAGING — CR DG CHEST 2V
2 series · 2 of 2 positions shown · non-contrast
Comparison: Chest x-ray 07/19/2011.

CLINICAL DATA: Sickle cell disease.  Chest pain, fever and cough.

CHEST - 2 VIEW

[w chest pa]
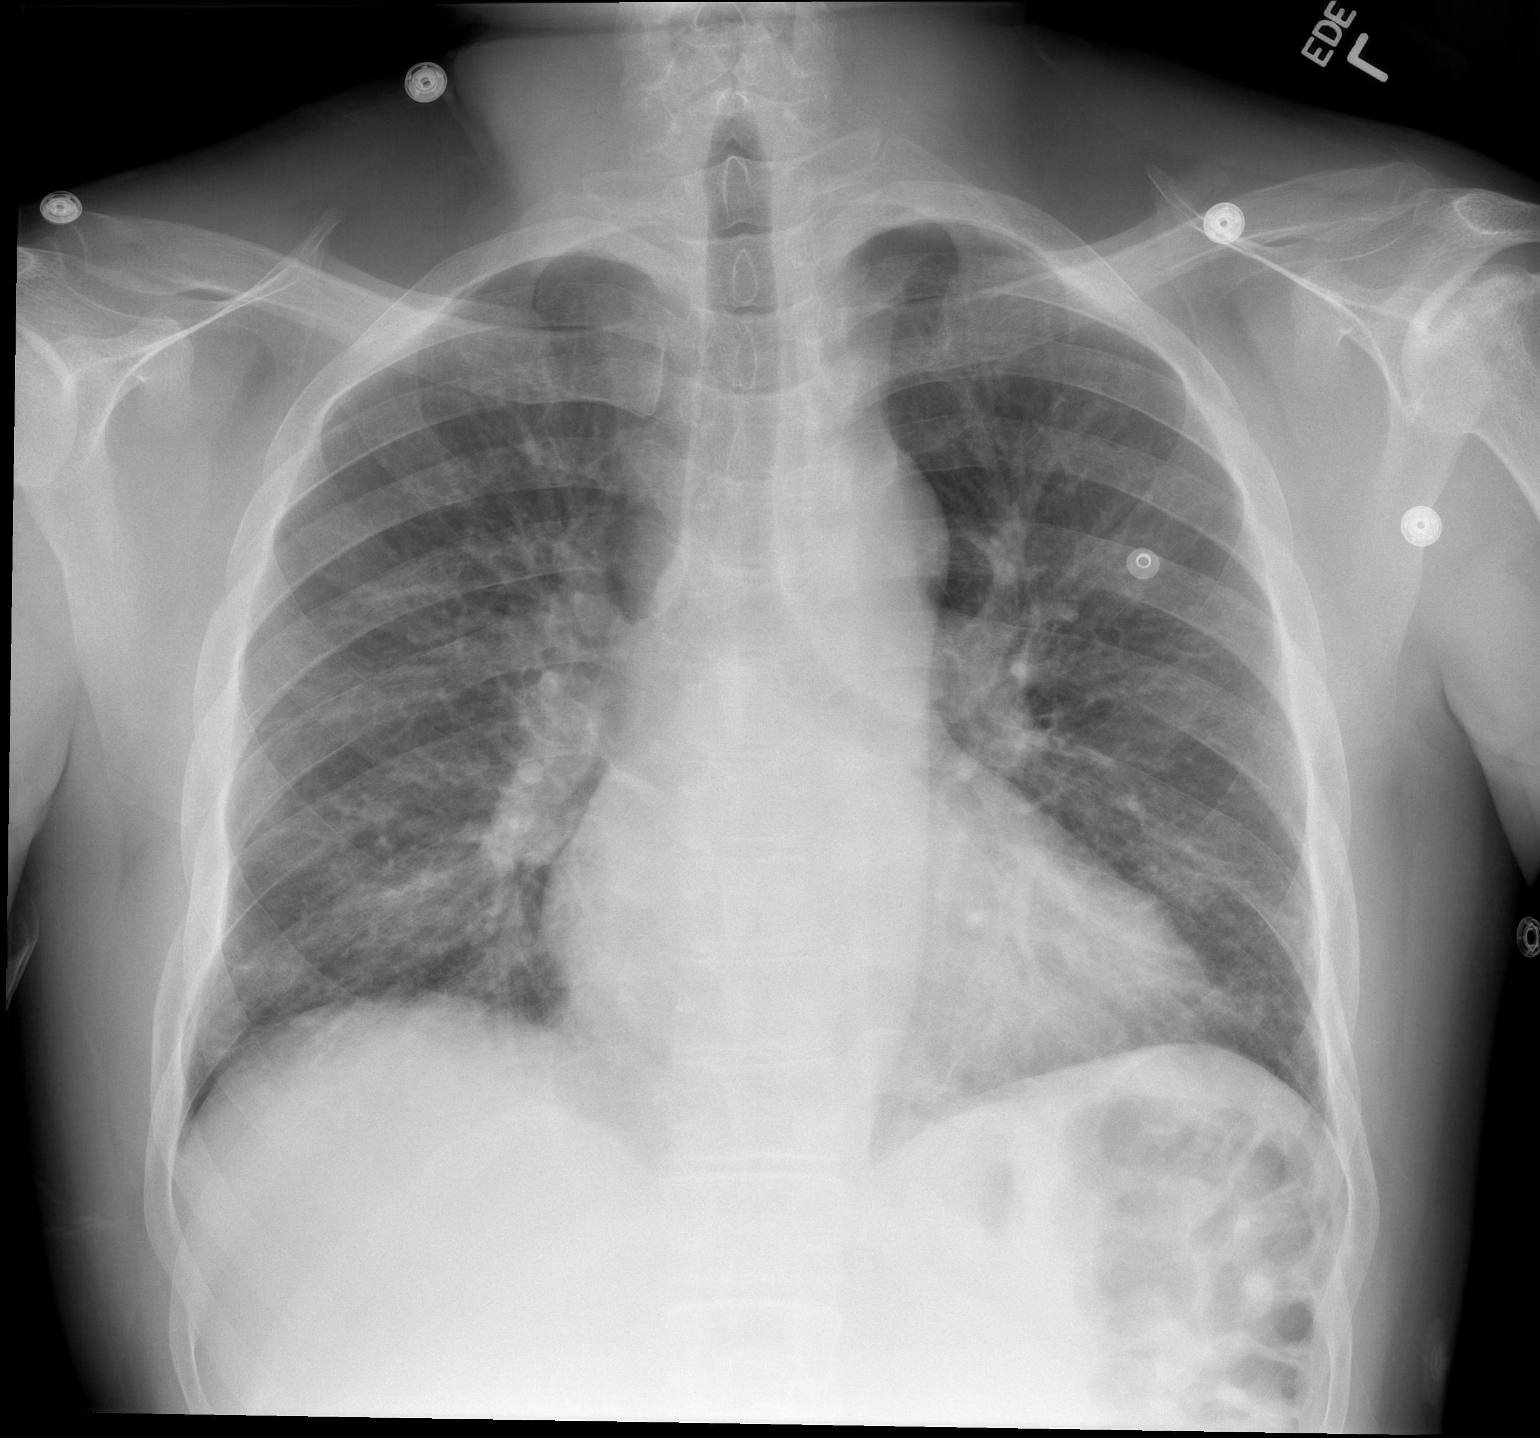

[w chest lat]
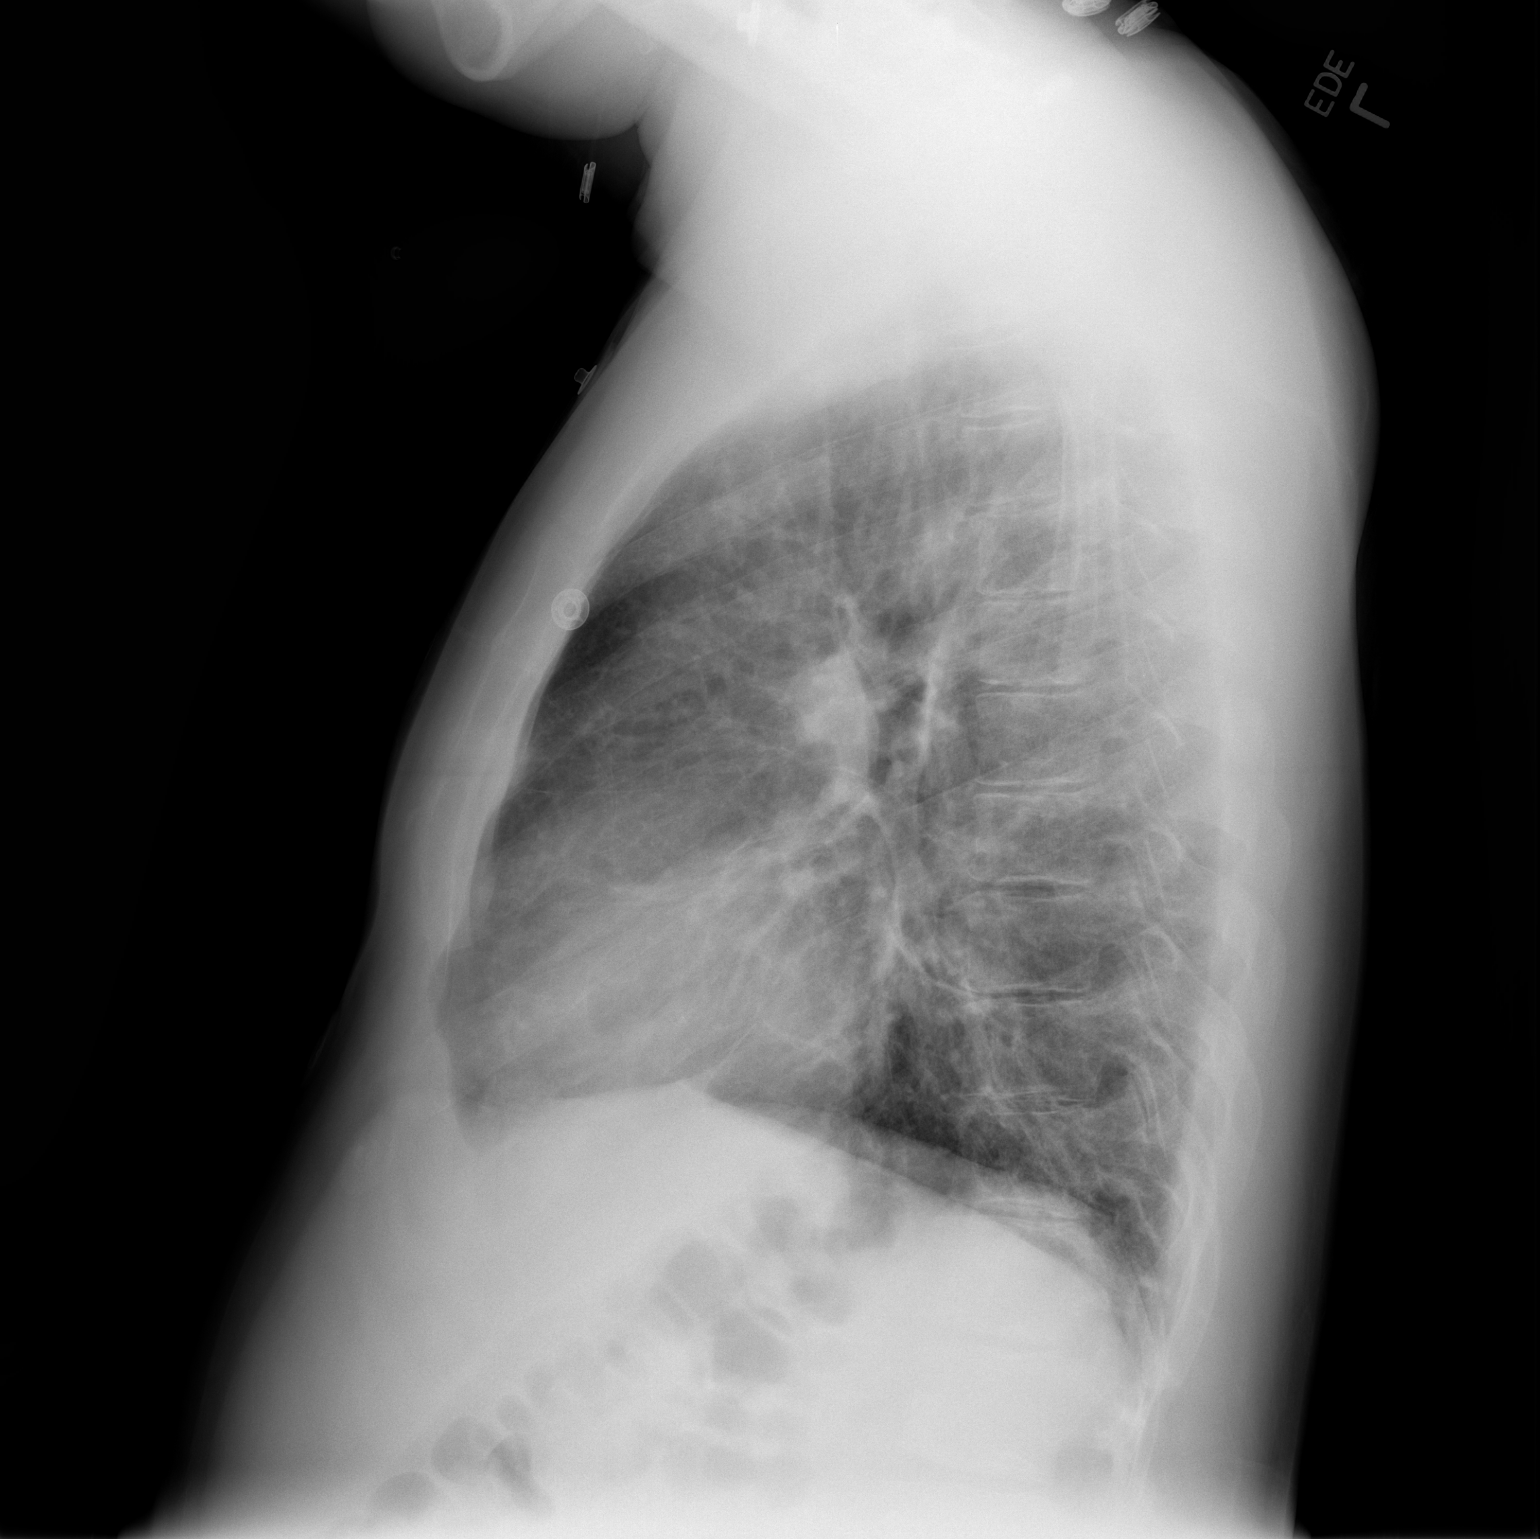

[2 of 2 positions shown; findings below may reference images not displayed]

FINDINGS: Lung volumes are normal.  Previously noted right upper
extremity PICC has been removed.  No acute consolidative air space
disease.  No definite pleural effusions.  Mild pulmonary venous
congestion without frank pulmonary edema.  Heart size is borderline
enlarged.  Mediastinal contours are unremarkable.
IMPRESSION: 1.  Mild pulmonary venous congestion without frank pulmonary edema.
No other radiographic evidence of acute cardiopulmonary disease.

## 2014-06-23 ENCOUNTER — Non-Acute Institutional Stay (HOSPITAL_COMMUNITY): Payer: Medicare Other

## 2014-06-23 ENCOUNTER — Telehealth (HOSPITAL_COMMUNITY): Payer: Self-pay | Admitting: Internal Medicine

## 2014-06-23 ENCOUNTER — Non-Acute Institutional Stay (HOSPITAL_COMMUNITY)
Admission: AD | Admit: 2014-06-23 | Discharge: 2014-06-23 | Disposition: A | Discharge status: Home / Self Care | Payer: Medicare Other | Source: Home / Self Care | Attending: Internal Medicine | Admitting: Internal Medicine

## 2014-06-23 ENCOUNTER — Encounter (HOSPITAL_COMMUNITY): Payer: Self-pay | Admitting: Hematology

## 2014-06-23 DIAGNOSIS — D571 Sickle-cell disease without crisis: Secondary | ICD-10-CM | POA: Insufficient documentation

## 2014-06-23 DIAGNOSIS — A047 Enterocolitis due to Clostridium difficile: Secondary | ICD-10-CM | POA: Diagnosis not present

## 2014-06-23 DIAGNOSIS — Z79899 Other long term (current) drug therapy: Secondary | ICD-10-CM

## 2014-06-23 DIAGNOSIS — I10 Essential (primary) hypertension: Secondary | ICD-10-CM | POA: Diagnosis not present

## 2014-06-23 DIAGNOSIS — R0902 Hypoxemia: Secondary | ICD-10-CM | POA: Diagnosis present

## 2014-06-23 DIAGNOSIS — E876 Hypokalemia: Secondary | ICD-10-CM

## 2014-06-23 DIAGNOSIS — D638 Anemia in other chronic diseases classified elsewhere: Secondary | ICD-10-CM | POA: Diagnosis not present

## 2014-06-23 DIAGNOSIS — Z87891 Personal history of nicotine dependence: Secondary | ICD-10-CM | POA: Insufficient documentation

## 2014-06-23 DIAGNOSIS — Z96641 Presence of right artificial hip joint: Secondary | ICD-10-CM | POA: Insufficient documentation

## 2014-06-23 DIAGNOSIS — Z7901 Long term (current) use of anticoagulants: Secondary | ICD-10-CM

## 2014-06-23 DIAGNOSIS — Z86711 Personal history of pulmonary embolism: Secondary | ICD-10-CM | POA: Insufficient documentation

## 2014-06-23 DIAGNOSIS — F39 Unspecified mood [affective] disorder: Secondary | ICD-10-CM

## 2014-06-23 DIAGNOSIS — D5700 Hb-SS disease with crisis, unspecified: Principal | ICD-10-CM | POA: Diagnosis present

## 2014-06-23 DIAGNOSIS — Q8901 Asplenia (congenital): Secondary | ICD-10-CM

## 2014-06-23 DIAGNOSIS — R0602 Shortness of breath: Secondary | ICD-10-CM | POA: Diagnosis not present

## 2014-06-23 DIAGNOSIS — R079 Chest pain, unspecified: Secondary | ICD-10-CM | POA: Diagnosis not present

## 2014-06-23 LAB — COMPREHENSIVE METABOLIC PANEL
ALT: 27 U/L (ref 17–63)
ANION GAP: 8 (ref 5–15)
AST: 41 U/L (ref 15–41)
Albumin: 4.3 g/dL (ref 3.5–5.0)
Alkaline Phosphatase: 72 U/L (ref 38–126)
BILIRUBIN TOTAL: 7.5 mg/dL — AB (ref 0.3–1.2)
BUN: 7 mg/dL (ref 6–20)
CALCIUM: 8.9 mg/dL (ref 8.9–10.3)
CO2: 22 mmol/L (ref 22–32)
CREATININE: 0.51 mg/dL — AB (ref 0.61–1.24)
Chloride: 106 mmol/L (ref 101–111)
GFR calc Af Amer: >60 mL/min (ref >60)
GFR calc non Af Amer: >60 mL/min (ref >60)
GLUCOSE: 102 mg/dL — AB (ref 65–99)
Potassium: 3.4 mmol/L — ABNORMAL LOW (ref 3.5–5.1)
Sodium: 136 mmol/L (ref 135–145)
TOTAL PROTEIN: 8 g/dL (ref 6.5–8.1)

## 2014-06-23 LAB — CBC WITH DIFFERENTIAL/PLATELET
BASOS ABS: 0.1 10*3/uL (ref 0.0–0.1)
Basophils Relative: 1 % (ref 0–1)
EOS ABS: 0.1 10*3/uL (ref 0.0–0.7)
EOS PCT: 1 % (ref 0–5)
HEMATOCRIT: 21.6 % — AB (ref 39.0–52.0)
Hemoglobin: 7.4 g/dL — ABNORMAL LOW (ref 13.0–17.0)
Lymphocytes Relative: 14 % (ref 12–46)
Lymphs Abs: 2 10*3/uL (ref 0.7–4.0)
MCH: 34.1 pg — ABNORMAL HIGH (ref 26.0–34.0)
MCHC: 34.3 g/dL (ref 30.0–36.0)
MCV: 99.5 fL (ref 78.0–100.0)
MONO ABS: 1.9 10*3/uL — AB (ref 0.1–1.0)
MONOS PCT: 13 % — AB (ref 3–12)
Neutro Abs: 10.2 10*3/uL — ABNORMAL HIGH (ref 1.7–7.7)
Neutrophils Relative %: 71 % (ref 43–77)
Platelets: 497 10*3/uL — ABNORMAL HIGH (ref 150–400)
RBC: 2.17 MIL/uL — ABNORMAL LOW (ref 4.22–5.81)
RDW: 21.2 % — ABNORMAL HIGH (ref 11.5–15.5)
WBC: 14.3 10*3/uL — AB (ref 4.0–10.5)
nRBC: 5 /100 WBC — ABNORMAL HIGH

## 2014-06-23 LAB — MAGNESIUM: Magnesium: 1.6 mg/dL — ABNORMAL LOW (ref 1.7–2.4)

## 2014-06-23 LAB — RETICULOCYTES
RBC.: 2.17 MIL/uL — ABNORMAL LOW (ref 4.22–5.81)
Retic Count, Absolute: 290.8 10*3/uL — ABNORMAL HIGH (ref 19.0–186.0)
Retic Ct Pct: 13.4 % — ABNORMAL HIGH (ref 0.4–3.1)

## 2014-06-23 LAB — LACTATE DEHYDROGENASE: LDH: 459 U/L — AB (ref 98–192)

## 2014-06-23 MED ORDER — HYDROMORPHONE HCL 2 MG/ML IJ SOLN
3.0000 mg | Freq: Once | INTRAMUSCULAR | Status: AC
  Administered 2014-06-23: 3 mg via INTRAVENOUS
  Filled 2014-06-23: qty 2

## 2014-06-23 MED ORDER — MAGNESIUM SULFATE 2 GM/50ML IV SOLN
2.0000 g | Freq: Once | INTRAVENOUS | Status: AC
  Administered 2014-06-23: 2 g via INTRAVENOUS
  Filled 2014-06-23: qty 50

## 2014-06-23 MED ORDER — POTASSIUM CHLORIDE CRYS ER 20 MEQ PO TBCR
40.0000 meq | EXTENDED_RELEASE_TABLET | ORAL | Status: AC
  Administered 2014-06-23 (×2): 40 meq via ORAL
  Filled 2014-06-23 (×2): qty 2

## 2014-06-23 MED ORDER — HYDROMORPHONE 2 MG/ML HIGH CONCENTRATION IV PCA SOLN
INTRAVENOUS | Status: DC
  Administered 2014-06-23: 13:00:00 via INTRAVENOUS
  Administered 2014-06-23: 7 mg via INTRAVENOUS
  Administered 2014-06-23: 4.9 mg via INTRAVENOUS
  Filled 2014-06-23 (×2): qty 25

## 2014-06-23 MED ORDER — DIPHENHYDRAMINE HCL 25 MG PO CAPS: 25.0000 mg | ORAL_CAPSULE | Freq: Four times a day (QID) | ORAL | Status: DC | PRN

## 2014-06-23 MED ORDER — SODIUM CHLORIDE 0.9 % IJ SOLN: 9.0000 mL | INTRAMUSCULAR | Status: DC | PRN

## 2014-06-23 MED ORDER — SODIUM CHLORIDE 0.9 % IJ SOLN
10.0000 mL | INTRAMUSCULAR | Status: AC | PRN
  Administered 2014-06-23: 10 mL

## 2014-06-23 MED ORDER — DEXTROSE-NACL 5-0.45 % IV SOLN
INTRAVENOUS | Status: DC
  Administered 2014-06-23: 12:00:00 via INTRAVENOUS

## 2014-06-23 MED ORDER — KETOROLAC TROMETHAMINE 30 MG/ML IJ SOLN
30.0000 mg | Freq: Four times a day (QID) | INTRAMUSCULAR | Status: DC
  Administered 2014-06-23: 30 mg via INTRAVENOUS
  Filled 2014-06-23: qty 1

## 2014-06-23 MED ORDER — HYDROMORPHONE HCL 2 MG/ML IJ SOLN
2.5000 mg | Freq: Once | INTRAMUSCULAR | Status: AC
  Administered 2014-06-23: 2.5 mg via INTRAVENOUS
  Filled 2014-06-23: qty 2

## 2014-06-23 MED ORDER — NALOXONE HCL 0.4 MG/ML IJ SOLN: 0.4000 mg | INTRAMUSCULAR | Status: DC | PRN

## 2014-06-23 MED ORDER — HYDROMORPHONE HCL 2 MG/ML IJ SOLN
2.0000 mg | Freq: Once | INTRAMUSCULAR | Status: AC
  Administered 2014-06-23: 2 mg via INTRAVENOUS
  Filled 2014-06-23: qty 1

## 2014-06-23 MED ORDER — HEPARIN SOD (PORK) LOCK FLUSH 100 UNIT/ML IV SOLN
500.0000 [IU] | INTRAVENOUS | Status: AC | PRN
  Administered 2014-06-23: 500 [IU]
  Filled 2014-06-23: qty 5

## 2014-06-23 MED ORDER — ONDANSETRON HCL 4 MG/2ML IJ SOLN: 4.0000 mg | Freq: Four times a day (QID) | INTRAMUSCULAR | Status: DC | PRN

## 2014-06-23 NOTE — H&P (Signed)
[facility] History and Physical   Date: 06/23/2014  Patient name: [name] Medical record number: [id number] Date of birth: 12-10-1980 Age: 34 y.o. Gender: male PCP: [name],[name], MD  Attending physician: [name], MD  Chief Complaint: Left flank pain  History of Present Illness: [name], a 34 year old male with a history of sickle cell anemia, HbSS. He states that pain intensity increased 1 day ago. Patient has not identified an attributing factor for current pain crisis. He states that pain is primarily to left flank.   Patient reports that current pain intensity is 7-8, described as intermittent and sharp. Patient states that he last had Dilaudid at 7 pm on 06/22/2014 and MS Contin 30 mg around 8 am, with minimal relief. [name] states that he has been taking medications consistently.  Patient denies headache, fatigue, shortness of breath, chest pains, nausea, vomiting, or diarrhea. Patient will be admitted to the day infusion center for extended observation.   Meds: Prescriptions prior to admission  Medication Sig Dispense Refill Last Dose  . Cholecalciferol (VITAMIN D) 2000 UNITS tablet Take 1 tablet (2,000 Units total) by mouth daily. 90 tablet 3 06/23/2014 at Unknown time  . folic acid (FOLVITE) 1 MG tablet Take 1 tablet (1 mg total) by mouth every morning. 30 tablet 11 06/23/2014 at Unknown time  . HYDROmorphone (DILAUDID) 4 MG tablet Take 1 tablet (4 mg total) by mouth every 4 (four) hours as needed for severe pain. 90 tablet 0 06/23/2014 at 0100  . hydroxyurea (HYDREA) 500 MG capsule Take 3 capsules (1,500 mg total) by mouth daily. May take with food to minimize GI side effects. 60 capsule 3 06/23/2014 at Unknown time  . lisinopril (PRINIVIL,ZESTRIL) 10 MG tablet Take 1 tablet (10 mg total) by mouth daily. 30 tablet 1 06/23/2014 at Unknown time  . metoprolol tartrate (LOPRESSOR) 25 MG tablet Take 1 tablet (25 mg total) by mouth  daily. 30 tablet 6 06/23/2014 at Unknown time  . morphine (MS CONTIN) 30 MG 12 hr tablet Take 1 tablet (30 mg total) by mouth every 12 (twelve) hours. 60 tablet 0 06/23/2014 at 0700  . potassium chloride SA (K-DUR,KLOR-CON) 20 MEQ tablet Take 1 tablet (20 mEq total) by mouth every morning. 30 tablet 3 06/23/2014 at Unknown time  . rivaroxaban (XARELTO) 20 MG TABS tablet Take 1 tablet (20 mg total) by mouth every morning. 30 tablet 2 06/23/2014 at Unknown time  . zolpidem (AMBIEN) 10 MG tablet Take 1 tablet (10 mg total) by mouth at bedtime as needed for sleep. 30 tablet 0 Past Week at Unknown time  . diltiazem (CARDIZEM CD) 120 MG 24 hr capsule Take 1 capsule (120 mg total) by mouth daily. 30 capsule 1 More than a month at Unknown time    Allergies: Review of patient's allergies indicates no known allergies. Past Medical History  Diagnosis Date  . Sickle cell anemia   . Blood transfusion   . Acute embolism and thrombosis of right internal jugular vein   . Hypokalemia   . Mood disorder   . History of pulmonary embolus (PE)   . Avascular necrosis   . Leukocytosis     Chronic  . Thrombocytosis     Chronic  . Hypertension   . History of Clostridium difficile infection   . Uses marijuana   . Chronic anticoagulation   . Functional asplenia   . Former smoker   . Second hand tobacco smoke exposure   .  Alcohol consumption of one to four drinks per day   . Noncompliance with medication regimen   . Sickle-cell crisis with associated acute chest syndrome 05/13/2013  . Acute chest syndrome 06/18/2013  . Demand ischemia 01/02/2014   Past Surgical History  Procedure Laterality Date  . Right hip replacement      08/2006  . Cholecystectomy      01/2008  . Porta cath placement    . Porta cath removal    . Umbilical hernia repair      01/2008  . Excision of left periauricular cyst      10/2009  . Excision of right ear lobe cyst with primary closur      11/2007  . Portacath placement  01/05/2012     Procedure: INSERTION PORT-A-CATH;  Surgeon: Odis Hollingshead, MD;  Location: [place];  Service: General;  Laterality: N/A;  ultrasound guiced port a cath insertion with fluoroscopy   Family History  Problem Relation Age of Onset  . Sickle cell trait Mother   . Depression Mother   . Diabetes Mother   . Sickle cell trait Father   . Sickle cell trait Brother    History   Social History  . Marital Status: Single    Spouse Name: N/A  . Number of Children: 0  . Years of Education: 13   Occupational History  . 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 Smoking status: Former Smoker -- 13 years    Quit date: 07/08/2010  . Smokeless tobacco: Never Used  . Alcohol Use: No  . Drug Use: 2.00 per week    Special: Marijuana  . Sexual Activity:    Partners: Female    Museum/gallery curator: None     Comment: month ago   Other Topics Concern  . Not on file   Social History Narrative   Lives in an apartment.  Single.  Lives alone but has a girlfriend that helps care for him.  Does not use any assist devices.        [name]:  [phone] Mom, emergency contact    Review of Systems: Constitutional: negative Eyes: positive for icterus Ears, nose, mouth, throat, and face: negative Respiratory: negative Cardiovascular: negative Gastrointestinal: negative Genitourinary:negative Integument/breast: negative Hematologic/lymphatic: negative Musculoskeletal:positive for myalgias and Left flank pain Neurological: negative Endocrine: negative Allergic/Immunologic: negative  Physical Exam: Blood pressure 120/72, pulse 92, temperature 98.2 F (36.8 C), temperature source Oral, resp. rate 20, height 6' (1.829 m), weight 163 lb (73.936 kg), SpO2 96 %. General appearance: alert, cooperative, appears stated age and mild distress Head: Normocephalic, without obvious abnormality, atraumatic Eyes: conjunctivae/corneas clear.  PERRL, EOM's intact. Fundi benign. Ears: normal TM's and external ear canals both ears Nose: Nares normal. Septum midline. Mucosa normal. No drainage or sinus tenderness. Back: symmetric, no curvature. ROM normal. No CVA tenderness. Lungs: clear to auscultation bilaterally Chest wall: left sided costochondral tenderness Heart: regular rate and rhythm, S1, S2 normal, no murmur, click, rub or gallop Abdomen: soft, non-tender; bowel sounds normal; no masses,  no organomegaly Extremities: extremities normal, atraumatic, no cyanosis or edema Pulses: 2+ and symmetric Skin: Skin color, texture, turgor normal. No rashes or lesions Lymph nodes: Cervical, supraclavicular, and axillary nodes normal. Neurologic: Alert and oriented X 3, normal strength and tone. Normal symmetric reflexes. Normal coordination and gait  Lab results: No results found. However, due to the size of the patient  record, not all encounters were searched. Please check Results Review for a complete set of results.  Imaging results:  No results found.   Assessment & Plan: Sickle cell anemia: Patient admitted to day infusion center for extended observation Start hypotonic IV fluid for cellular re-hydration Patient will start Toradol IV 30 mg times 1 for inflammation Will start dilaudid IV per weight based rapid re-dosing, if pain intensity remains elevated will transition to dilaudid high concentration PCA for pain control.  Will check CBC w/differential, CMP, and LDH  Plan outlined by Dr. Liston Alba   [facility] 06/23/2014, 11:38 AM

## 2014-06-23 NOTE — Progress Notes (Signed)
Patient ID: [name], male   DOB: 04-07-1980, 34 y.o.   MRN: [id number] Discharge instructions given to patient, along with follow up information.  Port flushed and de-accessed per protocol.  Patient states pain is currently 3/10 on pain scale.  Patient ambulatory at discharge.

## 2014-06-23 NOTE — Procedures (Addendum)
Note deleted; charted inadvertedly

## 2014-06-23 NOTE — Progress Notes (Signed)
Patient up ambulating in hallway on room air for 10 minutes, oxygen saturations 87-89%.  MD notified.  RN will continue to monitor.

## 2014-06-23 NOTE — Progress Notes (Signed)
Pt oxygen saturation 88% or room air; MD notified; pt placed on 2L oxygen nasal cannula; Oxygen level 94%; will continue to monitor

## 2014-06-23 NOTE — Discharge Summary (Signed)
Physician Discharge Summary  [name] CHE:[id number] DOB: September 05, 1980 DOA: 06/23/2014  PCP: Carrolyn Hilmes A., MD  Admit date: 06/23/2014 Discharge date: 06/23/2014  Discharge Diagnoses:  Active Problems:   Hb-SS disease with crisis   Discharge Condition:  Good  Disposition:  Home  Diet: Regular  Wt Readings from Last 3 Encounters:  06/23/14 163 lb (73.936 kg)  06/12/14 163 lb (73.936 kg)  06/03/14 163 lb (73.936 kg)    History of present illness:  [name], a 34 year old male with a history of sickle cell anemia, HbSS. He states that pain intensity increased 1 day ago. Patient has not identified an attributing factor for current pain crisis. He states that pain is primarily to left flank. Patient reports that current pain intensity is 7-8, described as intermittent and sharp. Patient states that he last had Dilaudid at 7 pm on 06/22/2014 and MS Contin 30 mg around 8 am, with minimal relief. Mr. Elman states that he has been taking medications consistently. Patient denies headache, fatigue, shortness of breath, chest pains, nausea, vomiting, or diarrhea. Patient will be admitted to the day infusion center for extended observation.    Hospital Course:  Pt was initially treated with weight based rapid re-dosing IVF and Toradol . Pain remained above 7/10 and Pt was transitioned to a weight based Dilaudid PCA . Patient's pain decreased to 5/10  And his function was at baseline at the time of discharge. He is discharged home in stable condition. He was mildly hypoxic during treatment but did not meet criteria for home oxygen. He has a history of pulmonary arterial HTN and Oxygen has been recommended for overnight use and during crisis. However patient has refused.     Discharge Exam:  Filed Vitals:   06/23/14 1624  BP:   Pulse: 86  Temp:   Resp: 20   Filed Vitals:   06/23/14 1502 06/23/14 1624 06/23/14 1645 06/23/14 1647  BP: 107/70     Pulse: 88 86     Temp: 98.6 F (37 C)     TempSrc: Oral     Resp: 22 20    Height:      Weight:      SpO2: 92% 88% 88% 94%     General: Alert, awake, oriented x3, in no acute distress.  HEENT: [place]/AT PEERL, EOMI, mild icterus Neck: Trachea midline,  no masses, no thyromegal,y no JVD, no carotid bruit OROPHARYNX:  Moist, No exudate/ erythema/lesions.  Heart: Regular rate and rhythm, without murmurs, rubs, gallops, PMI non-displaced, no heaves or thrills on palpation.  Lungs: Clear to auscultation, no wheezing or rhonchi noted. No increased vocal fremitus resonant to percussion  Abdomen: Soft, nontender, nondistended, positive bowel sounds, no masses no hepatosplenomegaly noted..  Neuro: No focal neurological deficits noted cranial nerves II through XII grossly intact.  Strength at bseline in bilateral upper and lower extremities. Musculoskeletal: No warm swelling or erythema around joints, no spinal tenderness noted. Psychiatric: Patient alert and oriented x3, good insight and cognition, good recent to remote recall. Lymph node survey: No cervical axillary or inguinal lymphadenopathy noted.   Discharge Instructions  Discharge Instructions    Diet - low sodium heart healthy    Complete by:  As directed      Increase activity slowly    Complete by:  As directed             Medication List    TAKE these medications        diltiazem  120 MG 24 hr capsule  Commonly known as:  CARDIZEM CD  Take 1 capsule (120 mg total) by mouth daily.     folic acid 1 MG tablet  Commonly known as:  FOLVITE  Take 1 tablet (1 mg total) by mouth every morning.     HYDROmorphone 4 MG tablet  Commonly known as:  DILAUDID  Take 1 tablet (4 mg total) by mouth every 4 (four) hours as needed for severe pain.     hydroxyurea 500 MG capsule  Commonly known as:  HYDREA  Take 3 capsules (1,500 mg total) by mouth daily. May take with food to minimize GI side effects.     lisinopril 10 MG tablet  Commonly known as:   PRINIVIL,ZESTRIL  Take 1 tablet (10 mg total) by mouth daily.     metoprolol tartrate 25 MG tablet  Commonly known as:  LOPRESSOR  Take 1 tablet (25 mg total) by mouth daily.     morphine 30 MG 12 hr tablet  Commonly known as:  MS CONTIN  Take 1 tablet (30 mg total) by mouth every 12 (twelve) hours.     potassium chloride SA 20 MEQ tablet  Commonly known as:  K-DUR,KLOR-CON  Take 1 tablet (20 mEq total) by mouth every morning.     rivaroxaban 20 MG Tabs tablet  Commonly known as:  XARELTO  Take 1 tablet (20 mg total) by mouth every morning.     Vitamin D 2000 UNITS tablet  Take 1 tablet (2,000 Units total) by mouth daily.     zolpidem 10 MG tablet  Commonly known as:  AMBIEN  Take 1 tablet (10 mg total) by mouth at bedtime as needed for sleep.          The results of significant diagnostics from this hospitalization (including imaging, microbiology, ancillary and laboratory) are listed below for reference.    Significant Diagnostic Studies: Dg Chest 2 View  06/03/2014   CLINICAL DATA:  Sickle cell crisis.  Dyspnea.  Left shoulder pain.  EXAM: CHEST  2 VIEW  COMPARISON:  05/28/2014  FINDINGS: There is a left subclavian Port-A-Cath with tip in the low SVC. Heart size is unchanged, moderately enlarged. There is mild chronic interstitial coarsening. There is no acute airspace opacity. There is no effusion. There is no pneumothorax. There is no significant interval change.  IMPRESSION: No acute findings.   Electronically Signed   By: Andreas Newport M.D.   On: 06/03/2014 05:16   Dg Chest 2 View  05/28/2014   CLINICAL DATA:  Bilateral lower rib pain and productive cough, sickle cell patient.  EXAM: CHEST  2 VIEW  COMPARISON:  05/22/2014 and 04/29/2014.  FINDINGS: Trachea is midline. Heart is enlarged. Left subclavian Port-A-Cath terminates in the high right atrium. Scattered pleural parenchymal scarring. No new areas of consolidation. No pleural fluid.  IMPRESSION: No acute findings.    Electronically Signed   By: Lorin Picket M.D.   On: 05/28/2014 18:40   Dg Shoulder Left  06/04/2014   CLINICAL DATA:  LEFT shoulder pain for 2-3 days, sickle cell disease, no known injury  EXAM: LEFT SHOULDER - 2+ VIEW  COMPARISON:  10/25/2012  FINDINGS: AC joint alignment normal.  Sclerosis identified sat uperior aspect of the LEFT humeral head consistent with avascular necrosis.  Pattern appears similar to that seen on the previous exam.  No acute fracture, dislocation or bone destruction.  No humeral head collapse identified.  LEFT subclavian Port-A-Cath visualized.  No acute  LEFT rib abnormalities noted.  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  No acute abnormalities.   Electronically Signed   By: Lavonia Dana M.D.   On: 06/04/2014 13:57    Microbiology: No results found for this or any previous visit (from the past 240 hour(s)).   Labs: Basic Metabolic Panel:  Recent Labs Lab 06/23/14 1124  NA 136  K 3.4*  CL 106  CO2 22  GLUCOSE 102*  BUN 7  CREATININE 0.51*  CALCIUM 8.9  MG 1.6*   Liver Function Tests:  Recent Labs Lab 06/23/14 1124  AST 41  ALT 27  ALKPHOS 72  BILITOT 7.5*  PROT 8.0  ALBUMIN 4.3   No results for input(s): LIPASE, AMYLASE in the last 168 hours. No results for input(s): AMMONIA in the last 168 hours. CBC:  Recent Labs Lab 06/23/14 1124  WBC 14.3*  NEUTROABS 10.2*  HGB 7.4*  HCT 21.6*  MCV 99.5  PLT 497*   Cardiac Enzymes: No results for input(s): CKTOTAL, CKMB, CKMBINDEX, TROPONINI in the last 168 hours. BNP: Invalid input(s): POCBNP CBG: No results for input(s): GLUCAP in the last 168 hours. Ferritin: No results for input(s): FERRITIN in the last 168 hours.  Time coordinating discharge: 35 minutes  Signed:  Laretta Pyatt A.  06/23/2014, 5:29 PM

## 2014-06-23 NOTE — Telephone Encounter (Signed)
Pt called and states experiencing pain in side and leg; rates pain 8/10; no relief from home pain medications; pt denies CP, SOB, N/V/D, and priapism; MD notified

## 2014-06-23 NOTE — Telephone Encounter (Signed)
Notified patient that, per MD, he may come to day hospital for evaluation; pt verbalizes understanding

## 2014-06-23 NOTE — Progress Notes (Signed)
Incentive spirometer given, and patient demonstrated usage.  Patient placed on R/A and oxygen saturation down to 88%.  O2 reapplied at 2L, oxygen staturation up to 91%.  MD notified.  RN will continue to monitor.

## 2014-06-25 ENCOUNTER — Inpatient Hospital Stay (HOSPITAL_COMMUNITY)
Admission: EM | Admit: 2014-06-25 | Discharge: 2014-07-01 | DRG: 812 | Disposition: A | Discharge status: Home / Self Care | Payer: Medicare Other | Attending: Internal Medicine | Admitting: Internal Medicine

## 2014-06-25 ENCOUNTER — Encounter (HOSPITAL_COMMUNITY): Payer: Self-pay

## 2014-06-25 ENCOUNTER — Emergency Department (HOSPITAL_COMMUNITY): Payer: Medicare Other

## 2014-06-25 DIAGNOSIS — D72829 Elevated white blood cell count, unspecified: Secondary | ICD-10-CM | POA: Diagnosis present

## 2014-06-25 DIAGNOSIS — Z7901 Long term (current) use of anticoagulants: Secondary | ICD-10-CM

## 2014-06-25 DIAGNOSIS — I2721 Secondary pulmonary arterial hypertension: Secondary | ICD-10-CM | POA: Diagnosis present

## 2014-06-25 DIAGNOSIS — R0781 Pleurodynia: Secondary | ICD-10-CM | POA: Diagnosis present

## 2014-06-25 DIAGNOSIS — D638 Anemia in other chronic diseases classified elsewhere: Secondary | ICD-10-CM

## 2014-06-25 DIAGNOSIS — D571 Sickle-cell disease without crisis: Secondary | ICD-10-CM | POA: Insufficient documentation

## 2014-06-25 DIAGNOSIS — D5700 Hb-SS disease with crisis, unspecified: Principal | ICD-10-CM

## 2014-06-25 DIAGNOSIS — I10 Essential (primary) hypertension: Secondary | ICD-10-CM | POA: Diagnosis present

## 2014-06-25 DIAGNOSIS — R0902 Hypoxemia: Secondary | ICD-10-CM

## 2014-06-25 DIAGNOSIS — J9611 Chronic respiratory failure with hypoxia: Secondary | ICD-10-CM | POA: Diagnosis present

## 2014-06-25 DIAGNOSIS — Z86711 Personal history of pulmonary embolism: Secondary | ICD-10-CM

## 2014-06-25 DIAGNOSIS — A0472 Enterocolitis due to Clostridium difficile, not specified as recurrent: Secondary | ICD-10-CM

## 2014-06-25 DIAGNOSIS — R079 Chest pain, unspecified: Secondary | ICD-10-CM

## 2014-06-25 LAB — COMPREHENSIVE METABOLIC PANEL
ALBUMIN: 3.8 g/dL (ref 3.5–5.0)
ALK PHOS: 93 U/L (ref 38–126)
ALT: 29 U/L (ref 17–63)
ANION GAP: 7 (ref 5–15)
AST: 36 U/L (ref 15–41)
BUN: 12 mg/dL (ref 6–20)
CALCIUM: 8.6 mg/dL — AB (ref 8.9–10.3)
CHLORIDE: 108 mmol/L (ref 101–111)
CO2: 22 mmol/L (ref 22–32)
Creatinine, Ser: 0.78 mg/dL (ref 0.61–1.24)
GFR calc Af Amer: >60 mL/min (ref >60)
GLUCOSE: 101 mg/dL — AB (ref 65–99)
Potassium: 4 mmol/L (ref 3.5–5.1)
Sodium: 137 mmol/L (ref 135–145)
Total Bilirubin: 6.2 mg/dL — ABNORMAL HIGH (ref 0.3–1.2)
Total Protein: 7.4 g/dL (ref 6.5–8.1)

## 2014-06-25 LAB — RETICULOCYTES
RBC.: 1.91 MIL/uL — ABNORMAL LOW (ref 4.22–5.81)
RETIC COUNT ABSOLUTE: 280.8 10*3/uL — AB (ref 19.0–186.0)
RETIC CT PCT: 14.7 % — AB (ref 0.4–3.1)

## 2014-06-25 LAB — LACTATE DEHYDROGENASE: LDH: 398 U/L — ABNORMAL HIGH (ref 98–192)

## 2014-06-25 LAB — MRSA PCR SCREENING: MRSA BY PCR: NEGATIVE

## 2014-06-25 LAB — MAGNESIUM: Magnesium: 1.8 mg/dL (ref 1.7–2.4)

## 2014-06-25 MED ORDER — ONDANSETRON HCL 4 MG/2ML IJ SOLN
4.0000 mg | Freq: Four times a day (QID) | INTRAMUSCULAR | Status: DC | PRN
  Administered 2014-06-25: 4 mg via INTRAVENOUS
  Filled 2014-06-25: qty 2

## 2014-06-25 MED ORDER — HYDROMORPHONE HCL 1 MG/ML IJ SOLN
1.0000 mg | Freq: Once | INTRAMUSCULAR | Status: AC
  Administered 2014-06-25: 1 mg via INTRAVENOUS
  Filled 2014-06-25: qty 1

## 2014-06-25 MED ORDER — SODIUM CHLORIDE 0.9 % IV SOLN
INTRAVENOUS | Status: DC
  Administered 2014-06-25: 09:00:00 via INTRAVENOUS

## 2014-06-25 MED ORDER — SODIUM CHLORIDE 0.9 % IV BOLUS (SEPSIS)
1000.0000 mL | Freq: Once | INTRAVENOUS | Status: AC
  Administered 2014-06-25: 1000 mL via INTRAVENOUS

## 2014-06-25 MED ORDER — ZOLPIDEM TARTRATE 10 MG PO TABS
10.0000 mg | ORAL_TABLET | Freq: Every evening | ORAL | Status: DC | PRN
  Administered 2014-06-25 – 2014-06-30 (×4): 10 mg via ORAL
  Filled 2014-06-25 (×4): qty 1

## 2014-06-25 MED ORDER — HYDROMORPHONE HCL 4 MG PO TABS: 4.0000 mg | ORAL_TABLET | ORAL | Status: DC | PRN

## 2014-06-25 MED ORDER — DIPHENHYDRAMINE HCL 25 MG PO CAPS
25.0000 mg | ORAL_CAPSULE | Freq: Four times a day (QID) | ORAL | Status: DC | PRN
  Filled 2014-06-25: qty 2

## 2014-06-25 MED ORDER — KETOROLAC TROMETHAMINE 30 MG/ML IJ SOLN
30.0000 mg | Freq: Four times a day (QID) | INTRAMUSCULAR | Status: AC
  Administered 2014-06-25 – 2014-06-30 (×19): 30 mg via INTRAVENOUS
  Filled 2014-06-25 (×21): qty 1

## 2014-06-25 MED ORDER — NALOXEGOL OXALATE 25 MG PO TABS
25.0000 mg | ORAL_TABLET | Freq: Every day | ORAL | Status: DC
  Administered 2014-06-25 – 2014-07-01 (×7): 25 mg via ORAL
  Filled 2014-06-25 (×8): qty 1

## 2014-06-25 MED ORDER — DEXTROSE-NACL 5-0.45 % IV SOLN
INTRAVENOUS | Status: DC
  Administered 2014-06-25 – 2014-06-28 (×7): via INTRAVENOUS
  Administered 2014-06-29: 1000 mL via INTRAVENOUS
  Administered 2014-06-30: via INTRAVENOUS

## 2014-06-25 MED ORDER — DIPHENHYDRAMINE HCL 50 MG/ML IJ SOLN
25.0000 mg | Freq: Once | INTRAMUSCULAR | Status: AC
  Administered 2014-06-25: 25 mg via INTRAVENOUS
  Filled 2014-06-25: qty 1

## 2014-06-25 MED ORDER — SODIUM CHLORIDE 0.9 % IV SOLN
25.0000 mg | INTRAVENOUS | Status: DC | PRN
  Administered 2014-06-28 – 2014-07-01 (×8): 25 mg via INTRAVENOUS
  Filled 2014-06-25 (×17): qty 0.5

## 2014-06-25 MED ORDER — NALOXONE HCL 0.4 MG/ML IJ SOLN: 0.4000 mg | INTRAMUSCULAR | Status: DC | PRN

## 2014-06-25 MED ORDER — VITAMIN D 1000 UNITS PO TABS
2000.0000 [IU] | ORAL_TABLET | Freq: Every day | ORAL | Status: DC
  Administered 2014-06-25 – 2014-07-01 (×7): 2000 [IU] via ORAL
  Filled 2014-06-25 (×8): qty 2

## 2014-06-25 MED ORDER — HYDROMORPHONE 2 MG/ML HIGH CONCENTRATION IV PCA SOLN
INTRAVENOUS | Status: DC
  Administered 2014-06-25: 09:00:00 via INTRAVENOUS
  Administered 2014-06-25: 4.2 mg via INTRAVENOUS
  Administered 2014-06-25: 6.3 mg via INTRAVENOUS
  Administered 2014-06-25: 7.7 mg via INTRAVENOUS
  Administered 2014-06-26: 4.2 mg via INTRAVENOUS
  Administered 2014-06-26: 5.6 mg via INTRAVENOUS
  Administered 2014-06-26: 6.3 mg via INTRAVENOUS
  Filled 2014-06-25: qty 25

## 2014-06-25 MED ORDER — SODIUM CHLORIDE 0.9 % IJ SOLN: 9.0000 mL | INTRAMUSCULAR | Status: DC | PRN

## 2014-06-25 MED ORDER — FOLIC ACID 1 MG PO TABS
1.0000 mg | ORAL_TABLET | Freq: Every morning | ORAL | Status: DC
  Administered 2014-06-25 – 2014-07-01 (×7): 1 mg via ORAL
  Filled 2014-06-25 (×7): qty 1

## 2014-06-25 MED ORDER — RIVAROXABAN 20 MG PO TABS
20.0000 mg | ORAL_TABLET | Freq: Every day | ORAL | Status: DC
  Administered 2014-06-26 – 2014-07-01 (×6): 20 mg via ORAL
  Filled 2014-06-25 (×8): qty 1

## 2014-06-25 MED ORDER — KETOROLAC TROMETHAMINE 30 MG/ML IJ SOLN
30.0000 mg | Freq: Once | INTRAMUSCULAR | Status: AC
  Administered 2014-06-25: 30 mg via INTRAVENOUS
  Filled 2014-06-25: qty 1

## 2014-06-25 MED ORDER — POTASSIUM CHLORIDE CRYS ER 20 MEQ PO TBCR
20.0000 meq | EXTENDED_RELEASE_TABLET | Freq: Every morning | ORAL | Status: DC
  Administered 2014-06-25 – 2014-07-01 (×7): 20 meq via ORAL
  Filled 2014-06-25 (×8): qty 1

## 2014-06-25 MED ORDER — RIVAROXABAN 20 MG PO TABS
20.0000 mg | ORAL_TABLET | Freq: Every morning | ORAL | Status: DC
  Administered 2014-06-25: 20 mg via ORAL
  Filled 2014-06-25: qty 1

## 2014-06-25 MED ORDER — LISINOPRIL 10 MG PO TABS
10.0000 mg | ORAL_TABLET | Freq: Every day | ORAL | Status: DC
Start: 2014-06-25 — End: 2014-07-01
  Administered 2014-06-25 – 2014-07-01 (×7): 10 mg via ORAL
  Filled 2014-06-25 (×7): qty 1

## 2014-06-25 MED ORDER — DILTIAZEM HCL ER COATED BEADS 120 MG PO CP24
120.0000 mg | ORAL_CAPSULE | Freq: Every day | ORAL | Status: DC
  Administered 2014-06-25 – 2014-07-01 (×7): 120 mg via ORAL
  Filled 2014-06-25 (×7): qty 1

## 2014-06-25 MED ORDER — HYDROMORPHONE HCL 1 MG/ML IJ SOLN
1.0000 mg | INTRAMUSCULAR | Status: DC | PRN
  Administered 2014-06-25 (×2): 1 mg via INTRAVENOUS
  Filled 2014-06-25 (×2): qty 1

## 2014-06-25 MED ORDER — HYDROMORPHONE HCL 2 MG/ML IJ SOLN: 2.0000 mg | INTRAMUSCULAR | Status: DC | PRN

## 2014-06-25 MED ORDER — MORPHINE SULFATE ER 30 MG PO TBCR
30.0000 mg | EXTENDED_RELEASE_TABLET | Freq: Two times a day (BID) | ORAL | Status: DC
  Administered 2014-06-25 – 2014-07-01 (×13): 30 mg via ORAL
  Filled 2014-06-25 (×13): qty 1

## 2014-06-25 NOTE — H&P (Signed)
Hospital Admission Note Date: 06/25/2014  Patient name: [name] Medical record number: [id number] Date of birth: 1980-05-09 Age: 34 y.o. Gender: male PCP: Hilliard Borges A., MD  Attending physician: [name], MD  Chief Complaint:Pain in side and legs x 2 days  History of Present Illness: Opiate tolerate patient with Hb SS well known to me who was treated in the [facility] 2 days ago for management of acute pain crisis. He reports that his pain was well controlled upon leaving the [facility].However during the night 2 days ago, he began to have increased pain in the chest wall and legs,He attempted treatment with his oral analgesics MS Contin and Dilaudid up to 8 mg and was unable to control the pain. He described pain  as throbbing in the leg and left ribs, and rates the pain currently as 7/10. However upon presenting to the ED his pain was at 8-9/10. He denies and n/v/d, SOB or dizzinesss. Pt takes Hydrea and folic acid and reports compliance.   Scheduled Meds: . diltiazem  120 mg Oral Daily  . folic acid  1 mg Oral q morning - 10a  . HYDROmorphone PCA 2 mg/mL   Intravenous 6 times per day  . ketorolac  30 mg Intravenous 4 times per day  . lisinopril  10 mg Oral Daily  . morphine  30 mg Oral Q12H  . naloxegol oxalate  25 mg Oral Daily  . potassium chloride SA  20 mEq Oral q morning - 10a  . rivaroxaban  20 mg Oral q morning - 10a  . Vitamin D  2,000 Units Oral Daily   Continuous Infusions: . sodium chloride     PRN Meds:.diphenhydrAMINE **OR** diphenhydrAMINE (BENADRYL) IVPB(SICKLE CELL ONLY), naloxone **AND** sodium chloride, ondansetron (ZOFRAN) IV, zolpidem Allergies: Review of patient's allergies indicates no known allergies. Past Medical History  Diagnosis Date  . Sickle cell anemia   . Blood transfusion   . Acute embolism and thrombosis of right internal jugular vein   . Hypokalemia   . Mood disorder   . History of pulmonary embolus (PE)   .  Avascular necrosis   . Leukocytosis     Chronic  . Thrombocytosis     Chronic  . Hypertension   . History of Clostridium difficile infection   . Uses marijuana   . Chronic anticoagulation   . Functional asplenia   . Former smoker   . Second hand tobacco smoke exposure   . Alcohol consumption of one to four drinks per day   . Noncompliance with medication regimen   . Sickle-cell crisis with associated acute chest syndrome 05/13/2013  . Acute chest syndrome 06/18/2013  . Demand ischemia 01/02/2014   Past Surgical History  Procedure Laterality Date  . Right hip replacement      08/2006  . Cholecystectomy      01/2008  . Porta cath placement    . Porta cath removal    . Umbilical hernia repair      01/2008  . Excision of left periauricular cyst      10/2009  . Excision of right ear lobe cyst with primary closur      11/2007  . Portacath placement  01/05/2012    Procedure: INSERTION PORT-A-CATH;  Surgeon: Odis Hollingshead, MD;  Location: [facility];  Service: General;  Laterality: N/A;  ultrasound guiced port a cath insertion with fluoroscopy   Family History  Problem Relation Age of Onset  . Sickle cell trait Mother   .  Depression Mother   . Diabetes Mother   . Sickle cell trait Father   . Sickle cell trait Brother    History   Social History  . Marital Status: Single    Spouse Name: N/A  . Number of Children: 0  . Years of Education: 13   Occupational History  . 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 Smoking status: Former Smoker -- 13 years    Quit date: 07/08/2010  . Smokeless tobacco: Never Used  . Alcohol Use: No  . Drug Use: 2.00 per week    Special: Marijuana  . Sexual Activity:    Partners: Female    Museum/gallery curator: None     Comment: month ago   Other Topics Concern  . Not on file   Social History Narrative   Lives in an apartment.  Single.  Lives alone but has a girlfriend that helps  care for him.  Does not use any assist devices.        [name]:  [phone] Mom, emergency contact   Review of Systems: A comprehensive review of systems was negative except as noted in the HPI. Physical Exam: No intake or output data in the 24 hours ending 06/25/14 0816 General: Alert, awake, oriented x3, in no acute distress.  HEENT: [place]/AT PEERL, EOMI, mild icterus Neck: Trachea midline,  no masses, no thyromegal,y no JVD, no carotid bruit Heart: Regular rate and rhythm, with II/VI murmurs at LSB (no change from previous),  No rubs and gallops, PMI non-displaced, no heaves or thrills on palpation.  Lungs: Clear to auscultation, no wheezing or rhonchi noted. No increased vocal fremitus resonant to percussion  Abdomen: Soft, nontender, nondistended, positive bowel sounds, no masses no hepatosplenomegaly noted. Neuro: No focal neurological deficits noted cranial nerves II through XII grossly intact. Strength at baseline in bilateral upper and lower extremities. Musculoskeletal: No warm swelling or erythema around joints, no spinal tenderness noted. Psychiatric: Patient alert and oriented x3, good insight and cognition, good recent to remote recall.   Lab results:  Recent Labs  06/23/14 1124 06/25/14 0408  NA 136 137  K 3.4* 4.0  CL 106 108  CO2 22 22  GLUCOSE 102* 101*  BUN 7 12  CREATININE 0.51* 0.78  CALCIUM 8.9 8.6*  MG 1.6*  --     Recent Labs  06/23/14 1124 06/25/14 0408  AST 41 36  ALT 27 29  ALKPHOS 72 93  BILITOT 7.5* 6.2*  PROT 8.0 7.4  ALBUMIN 4.3 3.8   No results for input(s): LIPASE, AMYLASE in the last 72 hours.  Recent Labs  06/23/14 1124 06/25/14 0408  WBC 14.3* 16.6*  NEUTROABS 10.2* 10.1*  HGB 7.4* 6.5*  HCT 21.6* 19.1*  MCV 99.5 100.0  PLT 497* 471*    Recent Labs  06/23/14 1124 06/25/14 0408  RETICCTPCT 13.4* 14.7*   Imaging results:  Dg Chest 2 View  06/25/2014   CLINICAL DATA:  Sickle cell crisis, chest pain.  EXAM:  CHEST  2 VIEW  COMPARISON:  Chest radiograph Jun 23, 2014  FINDINGS: Moderate cardiomegaly, unchanged from prior examination. Mediastinal silhouette is nonsuspicious. Diffuse slightly increasing interstitial prominence without pleural effusion or focal consolidation. No pneumothorax. 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 Single lumen LEFT chest Port-A-Cath with distal tip at cavoatrial junction.  IMPRESSION: Similar cardiomegaly. Mildly increasing interstitial prominence (atypical infection or  pulmonary edema) without focal consolidation.   Electronically Signed   By: Elon Alas M.D.   On: 06/25/2014 04:17   Dg Chest 2 View  06/24/2014   CLINICAL DATA:  Left flank pain, weakness, shortness of breath for 1 day, history of sickle cell disease  EXAM: CHEST  2 VIEW  COMPARISON:  Chest x-ray of 06/03/2014  FINDINGS: There is little change in cardiomegaly with minimal fullness of the perihilar vasculature. No frank congestive heart failure seen and no effusion is noted. Left Port-A-Cath is present with the tip seen to the expected SVC -RA junction. No bony abnormality is noted.  IMPRESSION: No significant change in cardiomegaly and perhaps minimal pulmonary vascular congestion. No effusion   Electronically Signed   By: Ivar Drape M.D.   On: 06/24/2014 08:26   Dg Chest 2 View  06/03/2014   CLINICAL DATA:  Sickle cell crisis.  Dyspnea.  Left shoulder pain.  EXAM: CHEST  2 VIEW  COMPARISON:  05/28/2014  FINDINGS: There is a left subclavian Port-A-Cath with tip in the low SVC. Heart size is unchanged, moderately enlarged. There is mild chronic interstitial coarsening. There is no acute airspace opacity. There is no effusion. There is no pneumothorax. There is no significant interval change.  IMPRESSION: No acute findings.   Electronically Signed   By: Andreas Newport M.D.   On: 06/03/2014 05:16   Dg Chest 2 View  05/28/2014   CLINICAL DATA:  Bilateral lower rib  pain and productive cough, sickle cell patient.  EXAM: CHEST  2 VIEW  COMPARISON:  05/22/2014 and 04/29/2014.  FINDINGS: Trachea is midline. Heart is enlarged. Left subclavian Port-A-Cath terminates in the high right atrium. Scattered pleural parenchymal scarring. No new areas of consolidation. No pleural fluid.  IMPRESSION: No acute findings.   Electronically Signed   By: Lorin Picket M.D.   On: 05/28/2014 18:40   Dg Shoulder Left  06/04/2014   CLINICAL DATA:  LEFT shoulder pain for 2-3 days, sickle cell disease, no known injury  EXAM: LEFT SHOULDER - 2+ VIEW  COMPARISON:  10/25/2012  FINDINGS: AC joint alignment normal.  Sclerosis identified sat uperior aspect of the LEFT humeral head consistent with avascular necrosis.  Pattern appears similar to that seen on the previous exam.  No acute fracture, dislocation or bone destruction.  No humeral head collapse identified.  LEFT subclavian Port-A-Cath visualized.  No acute LEFT rib abnormalities noted.  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  No acute abnormalities.   Electronically Signed   By: Lavonia Dana M.D.   On: 06/04/2014 13:57   Assessment and Plan:  1. Hb SS with crisis: Pt with pain of sickle cell crisis. Will place on weight based PCA, Toradol and IVF. Continue MS Contin. Will also give clinician assisted doses on an as needed basis and reassess pain tomorrow.  2. Anemia of chronic disease: Pt has anemia of chronic disease associated with ongoing low level hemolysis and characterized by chronically elevated LDH. Pt also has associated sequela of chronic hemolysis including PAH. He is on Hydrea which is known to stabilize Hb in patients with Hb SS as a result of increased Hb F%. However Alin has kept a low Hb as well as elevated WBC and Platelet counts which brings into question his compliance with Hydrea. Will check a hemoglobin electrophoresis to evaluate the percentage of Hb F as a result of Hydrea  use.  3. Hypoxemia: Pt   Has a mild hypoxemia  which tends to occur with him during a crisis. This is oftentimes due to shallow breathing associated with pain. Will encourage incentive spirometer use. Re-assess as pain improves.  4. Leukocytosis: Pt has a chronic leukocytosis which has a pattern of escalation during crisis. He  Has no current evidence of infection. Will continue to monitor.   5.Chronic anticoagulation: Pt has a h/o multiple PE's and is on chronic anti-coagulation with Xarelto. He reports compliance with daily dose.     Total time spent 60 minutes     Estelita Iten A. 06/25/2014, 8:16 AM

## 2014-06-25 NOTE — ED Provider Notes (Signed)
CSN: [id number]     Arrival date & time 06/25/14  0254 History   First MD Initiated Contact with Patient 06/25/14 [phone]     Chief Complaint  Patient presents with  . Sickle Cell Pain Crisis     (Consider location/radiation/quality/duration/timing/severity/associated sxs/prior Treatment) HPI [name] is a 34 y.o. male with past medical history of sickle cell anemia presenting today in an acute crisis. Patient states she's having pain in his bilateral sides and bilateral thighs. This is going on for the past 3 days. He takes Dilaudid and MS Contin at home without any pain relief. These are the normal areas of sickle cell crisis. He denies wearing oxygen at home. He denies any fevers, coughing, or recent infections. He states she's been compliant with his Xarelto medication for pulmonary embolism. He has no further complaints.   Past Medical History  Diagnosis Date  . Sickle cell anemia   . Blood transfusion   . Acute embolism and thrombosis of right internal jugular vein   . Hypokalemia   . Mood disorder   . History of pulmonary embolus (PE)   . Avascular necrosis   . Leukocytosis     Chronic  . Thrombocytosis     Chronic  . Hypertension   . History of Clostridium difficile infection   . Uses marijuana   . Chronic anticoagulation   . Functional asplenia   . Former smoker   . Second hand tobacco smoke exposure   . Alcohol consumption of one to four drinks per day   . Noncompliance with medication regimen   . Sickle-cell crisis with associated acute chest syndrome 05/13/2013  . Acute chest syndrome 06/18/2013  . Demand ischemia 01/02/2014   Past Surgical History  Procedure Laterality Date  . Right hip replacement      08/2006  . Cholecystectomy      01/2008  . Porta cath placement    . Porta cath removal    . Umbilical hernia repair      01/2008  . Excision of left periauricular cyst      10/2009  . Excision of right ear lobe cyst with primary closur      11/2007   . Portacath placement  01/05/2012    Procedure: INSERTION PORT-A-CATH;  Surgeon: Odis Hollingshead, MD;  Location: [facility];  Service: General;  Laterality: N/A;  ultrasound guiced port a cath insertion with fluoroscopy   Family History  Problem Relation Age of Onset  . Sickle cell trait Mother   . Depression Mother   . Diabetes Mother   . Sickle cell trait Father   . Sickle cell trait Brother    History  Substance Use Topics  . Smoking status: Former Smoker -- 13 years    Quit date: 07/08/2010  . Smokeless tobacco: Never Used  . Alcohol Use: No    Review of Systems    Allergies  Review of patient's allergies indicates no known allergies.  Home Medications   Prior to Admission medications   Medication Sig Start Date End Date Taking? Authorizing Provider  Cholecalciferol (VITAMIN D) 2000 UNITS tablet Take 1 tablet (2,000 Units total) by mouth daily. 02/13/14  Yes [name], MD  diltiazem (CARDIZEM CD) 120 MG 24 hr capsule Take 1 capsule (120 mg total) by mouth daily. 03/13/14  Yes Costin Karlyne Greenspan, MD  folic acid (FOLVITE) 1 MG tablet Take 1 tablet (1 mg total) by mouth every morning. 10/08/13  Yes [name], MD  HYDROmorphone (DILAUDID) 4 MG tablet Take 1 tablet (4 mg total) by mouth every 4 (four) hours as needed for severe pain. 06/06/14  Yes [name], MD  hydroxyurea (HYDREA) 500 MG capsule Take 3 capsules (1,500 mg total) by mouth daily. May take with food to minimize GI side effects. 10/30/13  Yes [name], MD  lisinopril (PRINIVIL,ZESTRIL) 10 MG tablet Take 1 tablet (10 mg total) by mouth daily. 01/22/14  Yes [name], MD  metoprolol tartrate (LOPRESSOR) 25 MG tablet Take 1 tablet (25 mg total) by mouth daily. 08/22/13  Yes [name], MD  morphine (MS CONTIN) 30 MG 12 hr tablet Take 1 tablet (30 mg total) by mouth every 12 (twelve) hours. 06/06/14  Yes Dorena Dew, FNP  potassium chloride SA (K-DUR,KLOR-CON) 20 MEQ  tablet Take 1 tablet (20 mEq total) by mouth every morning. 06/09/14  Yes [name], MD  rivaroxaban (XARELTO) 20 MG TABS tablet Take 1 tablet (20 mg total) by mouth every morning. 03/13/14  Yes Costin Karlyne Greenspan, MD  zolpidem (AMBIEN) 10 MG tablet Take 1 tablet (10 mg total) by mouth at bedtime as needed for sleep. 06/06/14  Yes Dorena Dew, FNP   BP 108/63 mmHg  Pulse 82  Temp(Src) 98.4 F (36.9 C) (Oral)  Resp 21  Ht 6' (1.829 m)  Wt 163 lb (73.936 kg)  BMI 22.10 kg/m2  SpO2 86% Physical Exam  Constitutional: He is oriented to person, place, and time. Vital signs are normal. He appears well-developed and well-nourished.  Non-toxic appearance. He does not appear ill. No distress.  Drowsy but easily arousable.  HENT:  Head: Normocephalic and atraumatic.  Nose: Nose normal.  Mouth/Throat: Oropharynx is clear and moist. No oropharyngeal exudate.  Eyes: Conjunctivae and EOM are normal. Pupils are equal, round, and reactive to light. Scleral icterus is present.  Neck: Normal range of motion. Neck supple. No tracheal deviation, no edema, no erythema and normal range of motion present. No thyroid mass and no thyromegaly present.  Cardiovascular: Normal rate, regular rhythm, S1 normal, S2 normal, normal heart sounds, intact distal pulses and normal pulses.  Exam reveals no gallop and no friction rub.   No murmur heard. Pulses:      Radial pulses are 2+ on the right side, and 2+ on the left side.       Dorsalis pedis pulses are 2+ on the right side, and 2+ on the left side.  Pulmonary/Chest: Effort normal and breath sounds normal. No respiratory distress. He has no wheezes. He has no rhonchi. He has no rales.  Abdominal: Soft. Normal appearance and bowel sounds are normal. He exhibits no distension, no ascites and no mass. There is no hepatosplenomegaly. There is no tenderness. There is no rebound, no guarding and no CVA tenderness.  Musculoskeletal: Normal range of motion. He exhibits  no edema or tenderness.  Lymphadenopathy:    He has no cervical adenopathy.  Neurological: He is alert and oriented to person, place, and time. He has normal strength. No cranial nerve deficit or sensory deficit. He exhibits normal muscle tone.  Skin: Skin is warm, dry and intact. No petechiae and no rash noted. He is not diaphoretic. No erythema. No pallor.  Nursing note and vitals reviewed.   ED Course  Procedures (including critical care time) Labs Review Labs Reviewed  CBC WITH DIFFERENTIAL/PLATELET - Abnormal; Notable for the following:    WBC 16.6 (*)    RBC 1.91 (*)  Hemoglobin 6.5 (*)    HCT 19.1 (*)    RDW 21.2 (*)    Platelets 471 (*)    Monocytes Relative 14 (*)    nRBC 2 (*)    Neutro Abs 10.1 (*)    Monocytes Absolute 2.3 (*)    Basophils Absolute 0.2 (*)    All other components within normal limits  COMPREHENSIVE METABOLIC PANEL - Abnormal; Notable for the following:    Glucose, Bld 101 (*)    Calcium 8.6 (*)    Total Bilirubin 6.2 (*)    All other components within normal limits  RETICULOCYTES - Abnormal; Notable for the following:    Retic Ct Pct 14.7 (*)    RBC. 1.91 (*)    Retic Count, Manual 280.8 (*)    All other components within normal limits  TYPE AND SCREEN    Imaging Review Dg Chest 2 View  06/25/2014   CLINICAL DATA:  Sickle cell crisis, chest pain.  EXAM: CHEST  2 VIEW  COMPARISON:  Chest radiograph Jun 23, 2014  FINDINGS: Moderate cardiomegaly, unchanged from prior examination. Mediastinal silhouette is nonsuspicious. Diffuse slightly increasing interstitial prominence without pleural effusion or focal consolidation. No pneumothorax. 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 Single lumen LEFT chest Port-A-Cath with distal tip at cavoatrial junction.  IMPRESSION: Similar cardiomegaly. Mildly increasing interstitial prominence (atypical infection or pulmonary edema) without focal consolidation.    Electronically Signed   By: Elon Alas M.D.   On: 06/25/2014 04:17   Dg Chest 2 View  06/24/2014   CLINICAL DATA:  Left flank pain, weakness, shortness of breath for 1 day, history of sickle cell disease  EXAM: CHEST  2 VIEW  COMPARISON:  Chest x-ray of 06/03/2014  FINDINGS: There is little change in cardiomegaly with minimal fullness of the perihilar vasculature. No frank congestive heart failure seen and no effusion is noted. Left Port-A-Cath is present with the tip seen to the expected SVC -RA junction. No bony abnormality is noted.  IMPRESSION: No significant change in cardiomegaly and perhaps minimal pulmonary vascular congestion. No effusion   Electronically Signed   By: Ivar Drape M.D.   On: 06/24/2014 08:26     EKG Interpretation None      MDM   Final diagnoses:  Chest pain    Patient presents to emergency department for sickle cell crisis. He normally takes Dilaudid and MS Contin at home, he was given IV Dilaudid in the emergency department for treatment of his pain. Patient was also given fluids and Toradol. Chest x-ray does not show any consolidation.   He was given additional doses of Dilaudid for pain control. His oxygenation was as low as 86% on room air. Looking back in his charts I do not believe this is his baseline. This may be secondary to pain or perhaps oversedation. Hemoglobin today is 6.5. Type and screen was sent, patient will be admitted for continued management. I spoke with Dr. Posey Pronto with [facility] who accepts the patient to [facility] floor.  Everlene Balls, MD 06/25/14 [phone]

## 2014-06-25 NOTE — ED Notes (Signed)
Patient transported to X-ray 

## 2014-06-25 NOTE — ED Notes (Signed)
Pt complains of rib and leg pain for two days

## 2014-06-25 NOTE — ED Notes (Signed)
Patient called out for additional pain medication.  He was found, five minutes later, to be asleep.  Awakened easily to voice, reporting 6/10 pain.

## 2014-06-26 DIAGNOSIS — A0472 Enterocolitis due to Clostridium difficile, not specified as recurrent: Secondary | ICD-10-CM

## 2014-06-26 LAB — BASIC METABOLIC PANEL
Anion gap: 6 (ref 5–15)
BUN: 16 mg/dL (ref 6–20)
CALCIUM: 8.5 mg/dL — AB (ref 8.9–10.3)
CHLORIDE: 110 mmol/L (ref 101–111)
CO2: 23 mmol/L (ref 22–32)
CREATININE: 0.47 mg/dL — AB (ref 0.61–1.24)
GFR calc non Af Amer: >60 mL/min (ref >60)
Glucose, Bld: 108 mg/dL — ABNORMAL HIGH (ref 65–99)
POTASSIUM: 3.9 mmol/L (ref 3.5–5.1)
SODIUM: 139 mmol/L (ref 135–145)

## 2014-06-26 LAB — CBC WITH DIFFERENTIAL/PLATELET
BASOS ABS: 0 10*3/uL (ref 0.0–0.1)
BASOS PCT: 0 % (ref 0–1)
Basophils Absolute: 0.2 10*3/uL — ABNORMAL HIGH (ref 0.0–0.1)
Basophils Relative: 1 % (ref 0–1)
EOS ABS: 0.5 10*3/uL (ref 0.0–0.7)
EOS ABS: 0.2 10*3/uL (ref 0.0–0.7)
EOS PCT: 1 % (ref 0–5)
Eosinophils Relative: 3 % (ref 0–5)
HCT: 19.1 % — ABNORMAL LOW (ref 39.0–52.0)
HCT: 19.8 % — ABNORMAL LOW (ref 39.0–52.0)
Hemoglobin: 6.5 g/dL — CL (ref 13.0–17.0)
Hemoglobin: 6.6 g/dL — CL (ref 13.0–17.0)
LYMPHS ABS: 1 10*3/uL (ref 0.7–4.0)
LYMPHS PCT: 21 % (ref 12–46)
LYMPHS PCT: 6 % — AB (ref 12–46)
Lymphs Abs: 3.5 10*3/uL (ref 0.7–4.0)
MCH: 34 pg (ref 26.0–34.0)
MCH: 33.5 pg (ref 26.0–34.0)
MCHC: 34 g/dL (ref 30.0–36.0)
MCHC: 33.3 g/dL (ref 30.0–36.0)
MCV: 100 fL (ref 78.0–100.0)
MCV: 100.5 fL — ABNORMAL HIGH (ref 78.0–100.0)
MONOS PCT: 14 % — AB (ref 3–12)
Monocytes Absolute: 2.3 10*3/uL — ABNORMAL HIGH (ref 0.1–1.0)
Monocytes Absolute: 1.1 10*3/uL — ABNORMAL HIGH (ref 0.1–1.0)
Monocytes Relative: 7 % (ref 3–12)
NRBC: 3 /100{WBCs} — AB
Neutro Abs: 10.1 10*3/uL — ABNORMAL HIGH (ref 1.7–7.7)
Neutro Abs: 13.9 10*3/uL — ABNORMAL HIGH (ref 1.7–7.7)
Neutrophils Relative %: 61 % (ref 43–77)
Neutrophils Relative %: 86 % — ABNORMAL HIGH (ref 43–77)
Platelets: 471 10*3/uL — ABNORMAL HIGH (ref 150–400)
Platelets: 411 10*3/uL — ABNORMAL HIGH (ref 150–400)
RBC: 1.91 MIL/uL — ABNORMAL LOW (ref 4.22–5.81)
RBC: 1.97 MIL/uL — AB (ref 4.22–5.81)
RDW: 21.2 % — AB (ref 11.5–15.5)
RDW: 21.6 % — ABNORMAL HIGH (ref 11.5–15.5)
WBC: 16.6 10*3/uL — ABNORMAL HIGH (ref 4.0–10.5)
WBC: 16.2 10*3/uL — ABNORMAL HIGH (ref 4.0–10.5)
nRBC: 2 /100 WBC — ABNORMAL HIGH

## 2014-06-26 LAB — CLOSTRIDIUM DIFFICILE BY PCR: CDIFFPCR: POSITIVE — AB

## 2014-06-26 LAB — LACTATE DEHYDROGENASE: LDH: 366 U/L — AB (ref 98–192)

## 2014-06-26 MED ORDER — HYDROMORPHONE 2 MG/ML HIGH CONCENTRATION IV PCA SOLN
INTRAVENOUS | Status: DC
  Administered 2014-06-26: 5.4 mg via INTRAVENOUS
  Administered 2014-06-26: 9.6 mg via INTRAVENOUS
  Administered 2014-06-26: 16:00:00 via INTRAVENOUS
  Administered 2014-06-26: 4 mg via INTRAVENOUS
  Administered 2014-06-27: 5.6 mg via INTRAVENOUS
  Administered 2014-06-27: 3.2 mg via INTRAVENOUS
  Administered 2014-06-27: 23:00:00 via INTRAVENOUS
  Administered 2014-06-27: 12 mg via INTRAVENOUS
  Administered 2014-06-27: 5.6 mg via INTRAVENOUS
  Administered 2014-06-28: 9.6 mg via INTRAVENOUS
  Administered 2014-06-28: 3.2 mg via INTRAVENOUS
  Administered 2014-06-28: 23:00:00 via INTRAVENOUS
  Administered 2014-06-28: 4.8 mg via INTRAVENOUS
  Administered 2014-06-28: 21.6 mg via INTRAVENOUS
  Administered 2014-06-28: 7.2 mg via INTRAVENOUS
  Administered 2014-06-28: 9.6 mg via INTRAVENOUS
  Administered 2014-06-29: 11.2 mg via INTRAVENOUS
  Administered 2014-06-29: 8.8 mg via INTRAVENOUS
  Administered 2014-06-29: 12 mg via INTRAVENOUS
  Filled 2014-06-26 (×4): qty 25

## 2014-06-26 MED ORDER — METRONIDAZOLE 500 MG PO TABS
500.0000 mg | ORAL_TABLET | Freq: Three times a day (TID) | ORAL | Status: DC
  Administered 2014-06-26 – 2014-07-01 (×15): 500 mg via ORAL
  Filled 2014-06-26 (×18): qty 1

## 2014-06-26 NOTE — Progress Notes (Signed)
CRITICAL VALUE ALERT  Critical value received:  Positive cdiff  Date of notification:  06/26/2014  Time of notification:  1624  Critical value read back:Yes.    Nurse who received alert:  Aldean Baker RN  MD notified (1st page):  Jonelle Sidle  Time of first page:  49  MD notified (2nd page):  Time of second page:  Responding MD:  Jonelle Sidle  Time MD responded:  4695 - ordered IV flagyl

## 2014-06-26 NOTE — Progress Notes (Signed)
Subjective: 34 year old gentleman admitted with sickle cell painful crisis. Patient is complaining of abdominal pain today with some nausea vomiting and some diarrhea. His pain in general is not 8 out of 10. He is on Dilaudid PCA and has used 32 mg with 38 demands and 32 deliveries. He denied fever or chills. No significant shortness of breath or cough. He has been mainly bedbound. The abdominal pain is colicky in nature and is in central abdomen. He could not sleep last night due to the pain. It sounds normal relationship with food but due to the nausea vomiting he has not been eating well.  Objective: Vital signs in last 24 hours: Temp:  [98.1 F (36.7 C)-98.8 F (37.1 C)] 98.3 F (36.8 C) (05/26 0617) Pulse Rate:  [72-88] 86 (05/26 0617) Resp:  [16-20] 16 (05/26 0617) BP: (103-143)/(63-83) 143/83 mmHg (05/26 0617) SpO2:  [92 %-97 %] 97 % (05/26 0617) Weight:  [71.759 kg (158 lb 3.2 oz)-72.621 kg (160 lb 1.6 oz)] 72.621 kg (160 lb 1.6 oz) (05/26 2094) Weight change: -2.177 kg (-4 lb 12.8 oz) Last BM Date: 06/24/14  Intake/Output from previous day: 05/25 0701 - 05/26 0700 In: 1560 [P.O.:1560] Out: 415 [Urine:415] Intake/Output this shift:    General appearance: alert, cooperative and no distress Head: Normocephalic, without obvious abnormality, atraumatic Neck: no adenopathy, no carotid bruit, no JVD, supple, symmetrical, trachea midline and thyroid not enlarged, symmetric, no tenderness/mass/nodules Back: symmetric, no curvature. ROM normal. No CVA tenderness. Resp: clear to auscultation bilaterally Chest wall: no tenderness Cardio: regular rate and rhythm, S1, S2 normal, no murmur, click, rub or gallop GI: soft, non-tender; bowel sounds normal; no masses,  no organomegaly Extremities: extremities normal, atraumatic, no cyanosis or edema Pulses: 2+ and symmetric Skin: Skin color, texture, turgor normal. No rashes or lesions Neurologic: Grossly normal  Lab Results:  Recent  Labs  06/23/14 1124 06/25/14 0408  WBC 14.3* 16.6*  HGB 7.4* 6.5*  HCT 21.6* 19.1*  PLT 497* 471*   BMET  Recent Labs  06/23/14 1124 06/25/14 0408  NA 136 137  K 3.4* 4.0  CL 106 108  CO2 22 22  GLUCOSE 102* 101*  BUN 7 12  CREATININE 0.51* 0.78  CALCIUM 8.9 8.6*    Studies/Results: Dg Chest 2 View  06/25/2014   CLINICAL DATA:  Sickle cell crisis, chest pain.  EXAM: CHEST  2 VIEW  COMPARISON:  Chest radiograph Jun 23, 2014  FINDINGS: Moderate cardiomegaly, unchanged from prior examination. Mediastinal silhouette is nonsuspicious. Diffuse slightly increasing interstitial prominence without pleural effusion or focal consolidation. No pneumothorax. 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 Single lumen LEFT chest Port-A-Cath with distal tip at cavoatrial junction.  IMPRESSION: Similar cardiomegaly. Mildly increasing interstitial prominence (atypical infection or pulmonary edema) without focal consolidation.   Electronically Signed   By: Elon Alas M.D.   On: 06/25/2014 04:17    Medications: I have reviewed the patient's current medications.  Assessment/Plan: A 34 year old gentleman admitted with sickle cell painful crisis and not having abdominal pain nausea vomiting diarrhea.  #1 sickle cell painful crisis: Patient is currently on Dilaudid PCA and other supportive care. He is not maximizing the use of the PCA at this point so I will not make any changes to his current regimen. I have encouraged him to use his PCA to the maximum. He also has physician assisted dosing which is available for father pain control.  #2 abdominal pain nausea vomiting and diarrhea: Patient has been  the hospital several early. We will check him for C. difficile and send the stool also for ova and parasites. We will treat him symptomatically for nausea vomiting with Zofran.  #3 sickle cell anemia: His hemoglobin has dropped slightly. This could be due to  hemodilution. It could also be mild hemolysis. We will follow it closely and transfuse if hemoglobin is less than 5.5 or if he is hemodynamically unstable.  #4 chronic anticoagulation: Continues around to unless bleeding is noted.  #5 leukocytosis: Could be due to some according infection versus sickle cell crisis. Continue to monitor.  LOS: 1 day   Tamy Accardo,[name] 06/26/2014, 7:11 AM

## 2014-06-27 DIAGNOSIS — A047 Enterocolitis due to Clostridium difficile: Secondary | ICD-10-CM

## 2014-06-27 LAB — CBC WITH DIFFERENTIAL/PLATELET
BASOS PCT: 0 % (ref 0–1)
Basophils Absolute: 0 10*3/uL (ref 0.0–0.1)
EOS PCT: 2 % (ref 0–5)
Eosinophils Absolute: 0.3 10*3/uL (ref 0.0–0.7)
HCT: 18 % — ABNORMAL LOW (ref 39.0–52.0)
Hemoglobin: 6.2 g/dL — CL (ref 13.0–17.0)
LYMPHS ABS: 1.7 10*3/uL (ref 0.7–4.0)
Lymphocytes Relative: 9 % — ABNORMAL LOW (ref 12–46)
MCH: 34.1 pg — AB (ref 26.0–34.0)
MCHC: 34.4 g/dL (ref 30.0–36.0)
MCV: 98.9 fL (ref 78.0–100.0)
Monocytes Absolute: 2.2 10*3/uL — ABNORMAL HIGH (ref 0.1–1.0)
Monocytes Relative: 12 % (ref 3–12)
NEUTROS PCT: 78 % — AB (ref 43–77)
Neutro Abs: 14.9 10*3/uL — ABNORMAL HIGH (ref 1.7–7.7)
Platelets: 406 10*3/uL — ABNORMAL HIGH (ref 150–400)
RBC: 1.82 MIL/uL — AB (ref 4.22–5.81)
RDW: 21 % — ABNORMAL HIGH (ref 11.5–15.5)
WBC: 19.1 10*3/uL — ABNORMAL HIGH (ref 4.0–10.5)

## 2014-06-27 LAB — COMPREHENSIVE METABOLIC PANEL
ALK PHOS: 71 U/L (ref 38–126)
ALT: 25 U/L (ref 17–63)
AST: 29 U/L (ref 15–41)
Albumin: 3.6 g/dL (ref 3.5–5.0)
Anion gap: 7 (ref 5–15)
BUN: 9 mg/dL (ref 6–20)
CO2: 22 mmol/L (ref 22–32)
CREATININE: 0.35 mg/dL — AB (ref 0.61–1.24)
Calcium: 8.6 mg/dL — ABNORMAL LOW (ref 8.9–10.3)
Chloride: 107 mmol/L (ref 101–111)
GFR calc non Af Amer: >60 mL/min (ref >60)
GLUCOSE: 104 mg/dL — AB (ref 65–99)
Potassium: 3.7 mmol/L (ref 3.5–5.1)
Sodium: 136 mmol/L (ref 135–145)
TOTAL PROTEIN: 6.8 g/dL (ref 6.5–8.1)
Total Bilirubin: 7.3 mg/dL — ABNORMAL HIGH (ref 0.3–1.2)

## 2014-06-27 NOTE — Progress Notes (Signed)
Subjective: Patient is still complaining of abdominal pain. His stool studies was positive for C. difficile and he has been initiated on Flagyl since yesterday. His diarrhea is slowing down but the abdominal pain is persistent. He still has central abdominal pain which is colicky in nature. The diarrhea has no blood no melena. He still has nausea but no vomiting. His pain is still at 7-8 out of 10 in his back and legs. He is on the Dilaudid PCA and has used 40 mg with 34 demands and 34 deliveries in the last 24 hours. He also has the position assisted dosing available.  Objective: Vital signs in last 24 hours: Temp:  [98.5 F (36.9 C)-99 F (37.2 C)] 98.9 F (37.2 C) (05/27 2134) Pulse Rate:  [79-95] 91 (05/27 2134) Resp:  [13-17] 17 (05/27 2134) BP: (110-130)/(71-88) 115/84 mmHg (05/27 2134) SpO2:  [92 %-98 %] 93 % (05/27 2134) Weight:  [73.846 kg (162 lb 12.8 oz)] 73.846 kg (162 lb 12.8 oz) (05/27 0624) Weight change: 2.087 kg (4 lb 9.6 oz) Last BM Date: 06/27/14  Intake/Output from previous day: 05/26 0701 - 05/27 0700 In: 2672 [P.O.:480; I.V.:2192] Out: 925 [Urine:925] Intake/Output this shift: Total I/O In: -  Out: 350 [Urine:350]  General appearance: alert, cooperative, appears stated age and mild distress Head: Normocephalic, without obvious abnormality, atraumatic Neck: no adenopathy, no carotid bruit, no JVD, supple, symmetrical, trachea midline and thyroid not enlarged, symmetric, no tenderness/mass/nodules Back: symmetric, no curvature. ROM normal. No CVA tenderness. Resp: clear to auscultation bilaterally Chest wall: no tenderness Cardio: regular rate and rhythm, S1, S2 normal, no murmur, click, rub or gallop GI: abnormal findings:  Soft, tender with positive bowel sounds. Extremities: extremities normal, atraumatic, no cyanosis or edema Pulses: 2+ and symmetric Skin: Skin color, texture, turgor normal. No rashes or lesions Neurologic: Grossly normal  Lab  Results:  Recent Labs  06/26/14 0650 06/27/14 0539  WBC 16.2* 19.1*  HGB 6.6* 6.2*  HCT 19.8* 18.0*  PLT 411* 406*   BMET  Recent Labs  06/26/14 0650 06/27/14 0539  NA 139 136  K 3.9 3.7  CL 110 107  CO2 23 22  GLUCOSE 108* 104*  BUN 16 9  CREATININE 0.47* 0.35*  CALCIUM 8.5* 8.6*    Studies/Results: No results found.  Medications: I have reviewed the patient's current medications.  Assessment/Plan: A 34 year old gentleman admitted with sickle cell painful crisis and not having abdominal pain nausea vomiting diarrhea.  #1 sickle cell painful crisis: Patient will be maintain her current regimen since he is progressively improving. He also has physician assisted dosing which is available for further pain control. We will keep that on board also. Continue with the Toradol and supportive care. Patient is also on his long-acting morphine sulfate.  #2 C. difficile colitis: Patient has been started on oral Flagyl. We will follow his response and institute contact precaution..  #3 sickle cell anemia: Hemoglobin has dropped a little further. No evidence of active hemolysis at this point. We will continue to monitor.  #4 chronic anticoagulation: Continues Xarelto unless bleeding is noted.  #5 leukocytosis: Probably secondary to C. difficile colitis  infection versus sickle cell crisis. Continue to monitor.  LOS: 2 days   Onna Nodal,[name] 06/27/2014, 10:54 PM

## 2014-06-28 LAB — CBC WITH DIFFERENTIAL/PLATELET
BASOS PCT: 0 % (ref 0–1)
Basophils Absolute: 0 10*3/uL (ref 0.0–0.1)
EOS ABS: 0.5 10*3/uL (ref 0.0–0.7)
EOS PCT: 3 % (ref 0–5)
HEMATOCRIT: 17.9 % — AB (ref 39.0–52.0)
Hemoglobin: 6.1 g/dL — CL (ref 13.0–17.0)
Lymphocytes Relative: 13 % (ref 12–46)
Lymphs Abs: 2.4 10*3/uL (ref 0.7–4.0)
MCH: 33.3 pg (ref 26.0–34.0)
MCHC: 34.1 g/dL (ref 30.0–36.0)
MCV: 97.8 fL (ref 78.0–100.0)
MONO ABS: 2.4 10*3/uL — AB (ref 0.1–1.0)
MONOS PCT: 14 % — AB (ref 3–12)
NEUTROS PCT: 70 % (ref 43–77)
Neutro Abs: 12.2 10*3/uL — ABNORMAL HIGH (ref 1.7–7.7)
PLATELETS: 394 10*3/uL (ref 150–400)
RBC: 1.83 MIL/uL — AB (ref 4.22–5.81)
RDW: 19.8 % — ABNORMAL HIGH (ref 11.5–15.5)
WBC: 17.5 10*3/uL — ABNORMAL HIGH (ref 4.0–10.5)

## 2014-06-28 NOTE — Progress Notes (Addendum)
Wasted 2cc of high concentration dilaudid 2mg /mL in PCA syringe, witnessed by Dorene Ar RN. Hortencia Conradi RN

## 2014-06-28 NOTE — Progress Notes (Signed)
Subjective: Patient is feeling better today. Pain is now at 7 out of 10.  It is bending his back and legs.  His diarrhea has slowed down and had only 1 episode today. He also was able to eat and drink today. Denied any melena no bright red blood per rectum. He is still on the PCA and has used 32.5 mg with 42 demands  and 40 deliveries in the last 24 hours. No shortness of breath no cough no nausea vomiting or diarrhea. Objective: Vital signs in last 24 hours: Temp:  [98.8 F (37.1 C)-99.2 F (37.3 C)] 98.9 F (37.2 C) (05/28 0532) Pulse Rate:  [76-95] 76 (05/28 0532) Resp:  [13-20] 16 (05/28 0532) BP: (108-130)/(67-88) 108/67 mmHg (05/28 0532) SpO2:  [93 %-98 %] 96 % (05/28 0532) Weight:  [73.437 kg (161 lb 14.4 oz)] 73.437 kg (161 lb 14.4 oz) (05/28 0532) Weight change: -0.408 kg (-14.4 oz) Last BM Date: 06/27/14  Intake/Output from previous day: 05/27 0701 - 05/28 0700 In: 1970.6 [P.O.:240; I.V.:1730.6] Out: 2200 [Urine:2200] Intake/Output this shift:    General appearance: alert, cooperative, appears stated age and mild distress Head: Normocephalic, without obvious abnormality, atraumatic Neck: no adenopathy, no carotid bruit, no JVD, supple, symmetrical, trachea midline and thyroid not enlarged, symmetric, no tenderness/mass/nodules Back: symmetric, no curvature. ROM normal. No CVA tenderness. Resp: clear to auscultation bilaterally Chest wall: no tenderness Cardio: regular rate and rhythm, S1, S2 normal, no murmur, click, rub or gallop GI: abnormal findings:  Soft, tender with positive bowel sounds. Extremities: extremities normal, atraumatic, no cyanosis or edema Pulses: 2+ and symmetric Skin: Skin color, texture, turgor normal. No rashes or lesions Neurologic: Grossly normal  Lab Results:  Recent Labs  06/26/14 0650 06/27/14 0539  WBC 16.2* 19.1*  HGB 6.6* 6.2*  HCT 19.8* 18.0*  PLT 411* 406*   BMET  Recent Labs  06/26/14 0650 06/27/14 0539  NA 139 136  K  3.9 3.7  CL 110 107  CO2 23 22  GLUCOSE 108* 104*  BUN 16 9  CREATININE 0.47* 0.35*  CALCIUM 8.5* 8.6*    Studies/Results: No results found.  Medications: I have reviewed the patient's current medications.  Assessment/Plan: A 34 year old gentleman admitted with sickle cell painful crisis and not having abdominal pain nausea vomiting diarrhea.  #1 sickle cell painful crisis: Patient is slowly improving.   We will keep him on current regimen.  #2 C. difficile colitis: Patient Is doing better with oral Flagyl.  Continue contact precaution. Will treat for a total of 14 days  #3 sickle cell anemia: Hemoglobin has remained stable. We will continue to monitor.  #4 chronic anticoagulation: Continues Xarelto unless bleeding is noted.  #5 leukocytosis: Probably secondary to C. difficile colitis  infection versus sickle cell crisis. Continue to monitor.  LOS: 3 days   Julliana Whitmyer,[name] 06/28/2014, 8:04 AM

## 2014-06-29 LAB — COMPREHENSIVE METABOLIC PANEL
ALBUMIN: 3.9 g/dL (ref 3.5–5.0)
ALT: 20 U/L (ref 17–63)
AST: 34 U/L (ref 15–41)
Alkaline Phosphatase: 78 U/L (ref 38–126)
Anion gap: 7 (ref 5–15)
BILIRUBIN TOTAL: 6.6 mg/dL — AB (ref 0.3–1.2)
BUN: 6 mg/dL (ref 6–20)
CALCIUM: 8.8 mg/dL — AB (ref 8.9–10.3)
CHLORIDE: 107 mmol/L (ref 101–111)
CO2: 23 mmol/L (ref 22–32)
CREATININE: 0.54 mg/dL — AB (ref 0.61–1.24)
GFR calc non Af Amer: >60 mL/min (ref >60)
GLUCOSE: 106 mg/dL — AB (ref 65–99)
Potassium: 3.6 mmol/L (ref 3.5–5.1)
Sodium: 137 mmol/L (ref 135–145)
TOTAL PROTEIN: 7.4 g/dL (ref 6.5–8.1)

## 2014-06-29 LAB — TYPE AND SCREEN
ABO/RH(D): O POS
Antibody Screen: NEGATIVE
Unit division: 0
Unit division: 0

## 2014-06-29 LAB — CBC WITH DIFFERENTIAL/PLATELET
BASOS ABS: 0 10*3/uL (ref 0.0–0.1)
BASOS PCT: 0 % (ref 0–1)
EOS ABS: 0.8 10*3/uL — AB (ref 0.0–0.7)
EOS PCT: 5 % (ref 0–5)
HCT: 18.1 % — ABNORMAL LOW (ref 39.0–52.0)
Hemoglobin: 6.3 g/dL — CL (ref 13.0–17.0)
LYMPHS ABS: 2.8 10*3/uL (ref 0.7–4.0)
Lymphocytes Relative: 18 % (ref 12–46)
MCH: 33.3 pg (ref 26.0–34.0)
MCHC: 34.8 g/dL (ref 30.0–36.0)
MCV: 95.8 fL (ref 78.0–100.0)
MONO ABS: 2.4 10*3/uL — AB (ref 0.1–1.0)
MONOS PCT: 15 % — AB (ref 3–12)
NEUTROS ABS: 9.8 10*3/uL — AB (ref 1.7–7.7)
Neutrophils Relative %: 62 % (ref 43–77)
Platelets: 418 10*3/uL — ABNORMAL HIGH (ref 150–400)
RBC: 1.89 MIL/uL — AB (ref 4.22–5.81)
RDW: 19.6 % — ABNORMAL HIGH (ref 11.5–15.5)
WBC: 15.8 10*3/uL — AB (ref 4.0–10.5)

## 2014-06-29 MED ORDER — HYDROMORPHONE 2 MG/ML HIGH CONCENTRATION IV PCA SOLN
INTRAVENOUS | Status: DC
  Administered 2014-06-29: via INTRAVENOUS
  Administered 2014-06-29: 14 mg via INTRAVENOUS
  Administered 2014-06-30: 5.4 mg via INTRAVENOUS
  Administered 2014-06-30: 12.4 mg via INTRAVENOUS
  Administered 2014-06-30: 13.2 mg via INTRAVENOUS
  Administered 2014-06-30: 10.2 mg via INTRAVENOUS
  Administered 2014-06-30: 4.8 mg via INTRAVENOUS
  Administered 2014-06-30 – 2014-07-01 (×2): 6.6 mg via INTRAVENOUS
  Administered 2014-07-01: 07:00:00 via INTRAVENOUS
  Administered 2014-07-01: 6 mg via INTRAVENOUS
  Administered 2014-07-01: 10.8 mg via INTRAVENOUS
  Administered 2014-07-01: 6 mg via INTRAVENOUS
  Filled 2014-06-29 (×3): qty 25

## 2014-06-29 NOTE — Plan of Care (Signed)
Problem: Phase II Progression Outcomes Goal: Pain at, < goal with appropriate interventions Outcome: Progressing Rating pain 4-5 out of 10 today

## 2014-06-29 NOTE — Plan of Care (Signed)
Problem: Phase I Progression Outcomes Goal: Pulmonary Hygiene as Indicated (Sickle Cell) Outcome: Progressing Incentive [id number]

## 2014-06-29 NOTE — Progress Notes (Signed)
Subjective: Patient is still having 5 out of 10 pain in his back. No fever or chills. No nausea vomiting or diarrhea.  He has been taking his Dilaudid PCA on used 30 mg with a 38 demands and 36 delivers in the last 24 hours. He denied any fever or chills.  No diarrhea today and no nausea vomiting.   He has been tolerating his meals. Objective: Vital signs in last 24 hours: Temp:  [98.4 F (36.9 C)-98.9 F (37.2 C)] 98.8 F (37.1 C) (05/29 1412) Pulse Rate:  [73-92] 77 (05/29 1412) Resp:  [12-18] 16 (05/29 1412) BP: (106-116)/(67-80) 107/75 mmHg (05/29 1412) SpO2:  [90 %-99 %] 95 % (05/29 1412) Weight:  [73.619 kg (162 lb 4.8 oz)] 73.619 kg (162 lb 4.8 oz) (05/29 0622) Weight change: 0.181 kg (6.4 oz) Last BM Date: 06/28/14  Intake/Output from previous day: 05/28 0701 - 05/29 0700 In: 340 [P.O.:240; IV Piggyback:100] Out: 925 [Urine:925] Intake/Output this shift: Total I/O In: 3475.8 [P.O.:697; I.V.:2778.8] Out: 960 [Urine:960]  General appearance: alert, cooperative, appears stated age and mild distress Head: Normocephalic, without obvious abnormality, atraumatic Neck: no adenopathy, no carotid bruit, no JVD, supple, symmetrical, trachea midline and thyroid not enlarged, symmetric, no tenderness/mass/nodules Back: symmetric, no curvature. ROM normal. No CVA tenderness. Resp: clear to auscultation bilaterally Chest wall: no tenderness Cardio: regular rate and rhythm, S1, S2 normal, no murmur, click, rub or gallop GI: abnormal findings:  Soft, tender with positive bowel sounds. Extremities: extremities normal, atraumatic, no cyanosis or edema Pulses: 2+ and symmetric Skin: Skin color, texture, turgor normal. No rashes or lesions Neurologic: Grossly normal  Lab Results:  Recent Labs  06/28/14 0930 06/29/14 0425  WBC 17.5* 15.8*  HGB 6.1* 6.3*  HCT 17.9* 18.1*  PLT 394 418*   BMET  Recent Labs  06/27/14 0539 06/29/14 0425  NA 136 137  K 3.7 3.6  CL 107 107  CO2  22 23  GLUCOSE 104* 106*  BUN 9 6  CREATININE 0.35* 0.54*  CALCIUM 8.6* 8.8*    Studies/Results: No results found.  Medications: I have reviewed the patient's current medications.  Assessment/Plan: A 34 year old gentleman admitted with sickle cell painful crisis and not having abdominal pain nausea vomiting diarrhea.  #1 sickle cell painful crisis: patient is doing much better.  I will discontinue the physician assisted dosing as well as reduce the PCA bolus dose from 0.8-0.6.  #2 C. difficile colitis: continue Flagyl for a total 14 days trea  #3 sickle cell anemia: table hemoglobin.  Continue to follow  #4 chronic anticoagulation: Continues Xarelto unless bleeding is noted.  #5 leukocytosis: Probably secondary to C. difficile colitis  infection versus sickle cell crisis. Continue to monitor.  LOS: 4 days   GeraldLAWAL 06/29/2014, 2:24 PM

## 2014-06-29 NOTE — Progress Notes (Cosign Needed)
Wasted 3cc of high concentration dilaudid 2mg /mL from PCA syringe, witnessed by [facility] RN.

## 2014-06-29 NOTE — Progress Notes (Signed)
CRITICAL VALUE ALERT  Critical value received:  Hemoglobin 6.3  Date of notification:  06/29/14  Time of notification:  0452  Critical value read back:Yes.    Nurse who received alert:  Hortencia Conradi RN   MD notified (1st page):  M. Donnal Debar NP  Time of first page:  [phone]  MD notified (2nd page):  Time of second page:  Responding MD:  M. Donnal Debar NP  Time MD responded:

## 2014-06-30 LAB — CBC WITH DIFFERENTIAL/PLATELET
Basophils Absolute: 0 10*3/uL (ref 0.0–0.1)
Basophils Relative: 0 % (ref 0–1)
EOS PCT: 5 % (ref 0–5)
Eosinophils Absolute: 0.8 10*3/uL — ABNORMAL HIGH (ref 0.0–0.7)
HEMATOCRIT: 18.3 % — AB (ref 39.0–52.0)
Hemoglobin: 6.3 g/dL — CL (ref 13.0–17.0)
Lymphocytes Relative: 21 % (ref 12–46)
Lymphs Abs: 3 10*3/uL (ref 0.7–4.0)
MCH: 32.6 pg (ref 26.0–34.0)
MCHC: 34.4 g/dL (ref 30.0–36.0)
MCV: 94.8 fL (ref 78.0–100.0)
MONO ABS: 1.9 10*3/uL — AB (ref 0.1–1.0)
Monocytes Relative: 14 % — ABNORMAL HIGH (ref 3–12)
NEUTROS ABS: 8.3 10*3/uL — AB (ref 1.7–7.7)
Neutrophils Relative %: 60 % (ref 43–77)
Platelets: 384 10*3/uL (ref 150–400)
RBC: 1.93 MIL/uL — ABNORMAL LOW (ref 4.22–5.81)
RDW: 19.2 % — ABNORMAL HIGH (ref 11.5–15.5)
WBC: 14 10*3/uL — ABNORMAL HIGH (ref 4.0–10.5)

## 2014-06-30 LAB — COMPREHENSIVE METABOLIC PANEL
ALK PHOS: 92 U/L (ref 38–126)
ALT: 19 U/L (ref 17–63)
AST: 34 U/L (ref 15–41)
Albumin: 3.7 g/dL (ref 3.5–5.0)
Anion gap: 7 (ref 5–15)
BILIRUBIN TOTAL: 5.1 mg/dL — AB (ref 0.3–1.2)
BUN: 7 mg/dL (ref 6–20)
CO2: 22 mmol/L (ref 22–32)
CREATININE: 0.53 mg/dL — AB (ref 0.61–1.24)
Calcium: 8.6 mg/dL — ABNORMAL LOW (ref 8.9–10.3)
Chloride: 108 mmol/L (ref 101–111)
GFR calc Af Amer: >60 mL/min (ref >60)
Glucose, Bld: 113 mg/dL — ABNORMAL HIGH (ref 65–99)
Potassium: 3.7 mmol/L (ref 3.5–5.1)
SODIUM: 137 mmol/L (ref 135–145)
TOTAL PROTEIN: 7.1 g/dL (ref 6.5–8.1)

## 2014-06-30 MED ORDER — METRONIDAZOLE 500 MG PO TABS: 500.0000 mg | ORAL_TABLET | Freq: Three times a day (TID) | ORAL | Status: DC

## 2014-06-30 NOTE — Progress Notes (Addendum)
Subjective: Patient is doing much better. Getting ready for discharge but suddenly had a flare of his pain and felt dizzy and weak. Discharge was therefore canceled. He denies any SOB, no cough. Still has abdominal discomfort but not having diarrhea or vomiting. He has no appetite today. No fever, no chills. Has used 28 mg of the Dilaudid with 62 demands and 58 deliveries in the last 24 hours. Objective: Vital signs in last 24 hours: Temp:  [98.8 F (37.1 C)-99.1 F (37.3 C)] 99.1 F (37.3 C) (05/30 2116) Pulse Rate:  [69-76] 71 (05/30 2116) Resp:  [14-19] 16 (05/30 2116) BP: (114-122)/(67-77) 119/77 mmHg (05/30 2116) SpO2:  [92 %-98 %] 98 % (05/30 2116) Weight change:  Last BM Date: 06/30/14  Intake/Output from previous day: 05/29 0701 - 05/30 0700 In: 4593.5 [P.O.:937; I.V.:3506.5; IV Piggyback:150] Out: 1360 [Urine:1360] Intake/Output this shift: Total I/O In: -  Out: 500 [Urine:500]  General appearance: alert, cooperative, appears stated age and mild distress Head: Normocephalic, without obvious abnormality, atraumatic Neck: no adenopathy, no carotid bruit, no JVD, supple, symmetrical, trachea midline and thyroid not enlarged, symmetric, no tenderness/mass/nodules Back: symmetric, no curvature. ROM normal. No CVA tenderness. Resp: clear to auscultation bilaterally Chest wall: no tenderness Cardio: regular rate and rhythm, S1, S2 normal, no murmur, click, rub or gallop GI: abnormal findings:  Soft, tender with positive bowel sounds. Extremities: extremities normal, atraumatic, no cyanosis or edema Pulses: 2+ and symmetric Skin: Skin color, texture, turgor normal. No rashes or lesions Neurologic: Grossly normal  Lab Results:  Recent Labs  06/29/14 0425 06/30/14 0406  WBC 15.8* 14.0*  HGB 6.3* 6.3*  HCT 18.1* 18.3*  PLT 418* 384   BMET  Recent Labs  06/29/14 0425 06/30/14 0406  NA 137 137  K 3.6 3.7  CL 107 108  CO2 23 22  GLUCOSE 106* 113*  BUN 6 7   CREATININE 0.54* 0.53*  CALCIUM 8.8* 8.6*    Studies/Results: No results found.  Medications: I have reviewed the patient's current medications.  Assessment/Plan: A 34 year old gentleman admitted with sickle cell painful crisis and not having abdominal pain nausea vomiting diarrhea.  #1 sickle cell painful crisis: patient is better but new pain now. 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 Continue home regimen today.   #2 C. difficile colitis: continue Flagyl for a total 14 days treatment.  #3 sickle cell anemia: Stable hemoglobin.  Continue to follow  #4 chronic anticoagulation: Continues Xarelto.  #5 leukocytosis: Probably secondary to C. difficile colitis  infection versus sickle cell crisis. Imprved. Continue to monitor.  LOS: 5 days   Kelli Egolf,[name] 06/30/2014, 11:30 AM

## 2014-07-01 DIAGNOSIS — D72829 Elevated white blood cell count, unspecified: Secondary | ICD-10-CM

## 2014-07-01 LAB — CBC WITH DIFFERENTIAL/PLATELET
Basophils Absolute: 0 10*3/uL (ref 0.0–0.1)
Basophils Relative: 0 % (ref 0–1)
Eosinophils Absolute: 0.6 10*3/uL (ref 0.0–0.7)
Eosinophils Relative: 4 % (ref 0–5)
HEMATOCRIT: 19.7 % — AB (ref 39.0–52.0)
Hemoglobin: 7 g/dL — ABNORMAL LOW (ref 13.0–17.0)
Lymphocytes Relative: 22 % (ref 12–46)
Lymphs Abs: 3.1 10*3/uL (ref 0.7–4.0)
MCH: 34 pg (ref 26.0–34.0)
MCHC: 35.5 g/dL (ref 30.0–36.0)
MCV: 95.6 fL (ref 78.0–100.0)
MONO ABS: 2.2 10*3/uL — AB (ref 0.1–1.0)
Monocytes Relative: 16 % — ABNORMAL HIGH (ref 3–12)
NRBC: 1 /100{WBCs} — AB
Neutro Abs: 8 10*3/uL — ABNORMAL HIGH (ref 1.7–7.7)
Neutrophils Relative %: 58 % (ref 43–77)
Platelets: 449 10*3/uL — ABNORMAL HIGH (ref 150–400)
RBC: 2.06 MIL/uL — ABNORMAL LOW (ref 4.22–5.81)
RDW: 19.7 % — AB (ref 11.5–15.5)
WBC: 13.9 10*3/uL — ABNORMAL HIGH (ref 4.0–10.5)

## 2014-07-01 LAB — BASIC METABOLIC PANEL
ANION GAP: 9 (ref 5–15)
BUN: <5 mg/dL — ABNORMAL LOW (ref 6–20)
CO2: 24 mmol/L (ref 22–32)
Calcium: 8.8 mg/dL — ABNORMAL LOW (ref 8.9–10.3)
Chloride: 105 mmol/L (ref 101–111)
Creatinine, Ser: 0.47 mg/dL — ABNORMAL LOW (ref 0.61–1.24)
GFR calc Af Amer: >60 mL/min (ref >60)
Glucose, Bld: 120 mg/dL — ABNORMAL HIGH (ref 65–99)
Potassium: 3.6 mmol/L (ref 3.5–5.1)
Sodium: 138 mmol/L (ref 135–145)

## 2014-07-01 LAB — HEMOGLOBINOPATHY EVALUATION
HGB A: 15.3 % — AB (ref 94.0–98.0)
Hgb A2 Quant: 4.5 % — ABNORMAL HIGH (ref 0.7–3.1)
Hgb C: 0 %
Hgb F Quant: 10.8 % — ABNORMAL HIGH (ref 0.0–2.0)
Hgb S Quant: 69.4 % — ABNORMAL HIGH

## 2014-07-01 LAB — PREPARE RBC (CROSSMATCH)

## 2014-07-01 LAB — LACTATE DEHYDROGENASE: LDH: 456 U/L — ABNORMAL HIGH (ref 98–192)

## 2014-07-01 MED ORDER — HEPARIN SOD (PORK) LOCK FLUSH 10 UNIT/ML IV SOLN
10.0000 [IU] | Freq: Once | INTRAVENOUS | Status: DC
  Filled 2014-07-01: qty 1

## 2014-07-01 MED ORDER — SODIUM CHLORIDE 0.9 % IV SOLN: Freq: Once | INTRAVENOUS | Status: DC

## 2014-07-01 MED ORDER — METRONIDAZOLE 500 MG PO TABS: 500.0000 mg | ORAL_TABLET | Freq: Three times a day (TID) | ORAL | Status: DC

## 2014-07-01 MED ORDER — HEPARIN SOD (PORK) LOCK FLUSH 100 UNIT/ML IV SOLN
500.0000 [IU] | Freq: Once | INTRAVENOUS | Status: DC
  Filled 2014-07-01: qty 5

## 2014-07-01 NOTE — Care Management Note (Signed)
Case Management Note  Patient Details  Name: [name] MRN: [id number] Date of Birth: [birth year]  Subjective/Objective:      34 yo admitted with [facility]              Action/Plan: From home alone  Expected Discharge Date:   (unknown)               Expected Discharge Plan:  Home/Self Care  In-House Referral:     Discharge planning Services  CM Consult  Post Acute Care Choice:    Choice offered to:     DME Arranged:    DME Agency:     HH Arranged:    [place] Agency:     Status of Service:  In process, will continue to follow  Medicare Important Message Given:  Yes Date Medicare IM Given:  07/01/14 Medicare IM give by:  Marney Doctor RN,BSN,NCM Date Additional Medicare IM Given:    Additional Medicare Important Message give by:     If discussed at [place] of Stay Meetings, dates discussed:    Additional Comments:  Lynnell Catalan, RN 07/01/2014, 2:34 PM

## 2014-07-01 NOTE — Progress Notes (Signed)
Pt PCA was d/c'ed prior to pt's discharge today. 16 mL of 2 mg/mL wasted.

## 2014-07-01 NOTE — Discharge Summary (Signed)
[name] MRN: [id number] DOB/AGE: 35/06/1980 35 y.o.  Admit date: 06/25/2014 Discharge date: 07/01/2014  Primary Care Physician:  Katera Rybka A., MD   Discharge Diagnoses:   Patient Active Problem List   Diagnosis Date Noted  . Pulmonary HTN 06/18/2013    Priority: High  . Functional asplenia     Priority: High  . 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 Avascular necrosis     Priority: Medium  . Enteritis due to Clostridium difficile 06/26/2014  . Sickle cell anemia 06/25/2014  . Anemia of chronic disease 06/25/2014  . PAH (pulmonary artery hypertension) 03/18/2014  . Pulmonary embolism 03/18/2014  . Sickle cell pain crisis 03/14/2014  . Hypoxia 03/14/2014  . Sickle cell crisis 03/11/2014  . Atrial fibrillation with RVR 03/11/2014  . 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 Pulmonary embolus 01/01/2014  . Paralytic strabismus, external ophthalmoplegia   . Chronic pain syndrome 12/12/2013  . Anemia 09/22/2013  . Chronic anticoagulation 08/22/2013  . Essential hypertension 08/22/2013  . Vitamin D deficiency 02/13/2013  . Uses marijuana 01/13/2013  . Leukocytosis 08/30/2012  . Rib pain 08/06/2012  . Embolism, pulmonary with infarction 07/09/2012  . Hx of pulmonary embolus 06/29/2012  . Hemochromatosis 12/14/2011    DISCHARGE MEDICATION:   Medication List    TAKE these medications        diltiazem 120 MG 24 hr capsule  Commonly known as:  CARDIZEM CD  Take 1 capsule (120 mg total) by mouth daily.     folic acid 1 MG tablet  Commonly known as:  FOLVITE  Take 1 tablet (1 mg total) by mouth every morning.     HYDROmorphone 4 MG tablet  Commonly known as:  DILAUDID  Take 1 tablet (4 mg total) by mouth every 4 (four) hours as needed for severe pain.     hydroxyurea 500 MG capsule  Commonly known as:  HYDREA  Take 3 capsules (1,500 mg total) by mouth daily. May take with food to minimize GI side effects.     lisinopril 10 MG  tablet  Commonly known as:  PRINIVIL,ZESTRIL  Take 1 tablet (10 mg total) by mouth daily.     metoprolol tartrate 25 MG tablet  Commonly known as:  LOPRESSOR  Take 1 tablet (25 mg total) by mouth daily.     metroNIDAZOLE 500 MG tablet  Commonly known as:  FLAGYL  Take 1 tablet (500 mg total) by mouth every 8 (eight) hours.     morphine 30 MG 12 hr tablet  Commonly known as:  MS CONTIN  Take 1 tablet (30 mg total) by mouth every 12 (twelve) hours.     potassium chloride SA 20 MEQ tablet  Commonly known as:  K-DUR,KLOR-CON  Take 1 tablet (20 mEq total) by mouth every morning.     rivaroxaban 20 MG Tabs tablet  Commonly known as:  XARELTO  Take 1 tablet (20 mg total) by mouth every morning.     Vitamin D 2000 UNITS tablet  Take 1 tablet (2,000 Units total) by mouth daily.     zolpidem 10 MG tablet  Commonly known as:  AMBIEN  Take 1 tablet (10 mg total) by mouth at bedtime as needed for sleep.          Consults: Treatment Team:  Florencia Reasons, MD Elwyn Reach, MD   SIGNIFICANT DIAGNOSTIC STUDIES:  Dg Chest 2 View  06/25/2014   CLINICAL DATA:  Sickle cell crisis, chest  pain.  EXAM: CHEST  2 VIEW  COMPARISON:  Chest radiograph Jun 23, 2014  FINDINGS: Moderate cardiomegaly, unchanged from prior examination. Mediastinal silhouette is nonsuspicious. Diffuse slightly increasing interstitial prominence without pleural effusion or focal consolidation. No pneumothorax. 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 Single lumen LEFT chest Port-A-Cath with distal tip at cavoatrial junction.  IMPRESSION: Similar cardiomegaly. Mildly increasing interstitial prominence (atypical infection or pulmonary edema) without focal consolidation.   Electronically Signed   By: Elon Alas M.D.   On: 06/25/2014 04:17   Dg Chest 2 View  06/24/2014   CLINICAL DATA:  Left flank pain, weakness, shortness of breath for 1 day, history of sickle cell disease   EXAM: CHEST  2 VIEW  COMPARISON:  Chest x-ray of 06/03/2014  FINDINGS: There is little change in cardiomegaly with minimal fullness of the perihilar vasculature. No frank congestive heart failure seen and no effusion is noted. Left Port-A-Cath is present with the tip seen to the expected SVC -RA junction. No bony abnormality is noted.  IMPRESSION: No significant change in cardiomegaly and perhaps minimal pulmonary vascular congestion. No effusion   Electronically Signed   By: Ivar Drape M.D.   On: 06/24/2014 08:26   Dg Chest 2 View  06/03/2014   CLINICAL DATA:  Sickle cell crisis.  Dyspnea.  Left shoulder pain.  EXAM: CHEST  2 VIEW  COMPARISON:  05/28/2014  FINDINGS: There is a left subclavian Port-A-Cath with tip in the low SVC. Heart size is unchanged, moderately enlarged. There is mild chronic interstitial coarsening. There is no acute airspace opacity. There is no effusion. There is no pneumothorax. There is no significant interval change.  IMPRESSION: No acute findings.   Electronically Signed   By: Andreas Newport M.D.   On: 06/03/2014 05:16   Dg Shoulder Left  06/04/2014   CLINICAL DATA:  LEFT shoulder pain for 2-3 days, sickle cell disease, no known injury  EXAM: LEFT SHOULDER - 2+ VIEW  COMPARISON:  10/25/2012  FINDINGS: AC joint alignment normal.  Sclerosis identified sat uperior aspect of the LEFT humeral head consistent with avascular necrosis.  Pattern appears similar to that seen on the previous exam.  No acute fracture, dislocation or bone destruction.  No humeral head collapse identified.  LEFT subclavian Port-A-Cath visualized.  No acute LEFT rib abnormalities noted.  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  No acute abnormalities.   Electronically Signed   By: Lavonia Dana M.D.   On: 06/04/2014 13:57       Recent Results (from the past 240 hour(s))  MRSA PCR Screening     Status: None   Collection Time: 06/25/14 11:00 AM  Result Value Ref Range  Status   MRSA by PCR NEGATIVE NEGATIVE Final    Comment:        The GeneXpert MRSA Assay (FDA approved for NASAL specimens only), is one component of a comprehensive MRSA colonization surveillance program. It is not intended to diagnose MRSA infection nor to guide or monitor treatment for MRSA infections.   Clostridium Difficile by PCR     Status: Abnormal   Collection Time: 06/26/14 11:33 AM  Result Value Ref Range Status   C difficile by pcr POSITIVE (A) NEGATIVE Final    Comment: CRITICAL RESULT CALLED TO, READ BACK BY AND VERIFIED WITH: [name],R @ 1229 ON [id number] BY POTEAT,S     BRIEF ADMITTING H & P: Opiate tolerate patient with Hb SS well  known to me who was treated in the [facility] 2 days ago for management of acute pain crisis. He reports that his pain was well controlled upon leaving the Day Hospital.However during the night 2 days ago, he began to have increased pain in the chest wall and legs,He attempted treatment with his oral analgesics MS Contin and Dilaudid up to 8 mg and was unable to control the pain. He described pain as throbbing in the leg and left ribs, and rates the pain currently as 7/10. However upon presenting to the ED his pain was at 8-9/10. He denies and n/v/d, SOB or dizzinesss. Pt takes Hydrea and folic acid and reports compliance.    Hospital Course:  Present on Admission:  . C. Diff Colitis: Pt developed abdominal pain and diarrhea  While hospitalized. He was subsequently diagnosed with c.diff by PCR. He was treated with Flagyl 500 mg TID and is currently on Day# 5/10 of treatment. He will be discharged on Flagyl 500 mg #15 tabs to complete 5 additional days.  Marland Kitchen Hb-SS disease with crisis: Pt was managed with Dilaudid by PCA, MS Contin, Toradol and IVF. He is discharged on MS Contin and Hydromorphone both by mouth. He has a n appointment to follow up in the office on 07/07/2014.  Marland Kitchen Anemia of chronic disease: Hb remained stable at baseline of 7. No  transfusions were required during this hospitalization.  . Hypoxia: Resolved as pain improved.  Marland Kitchen Hx of pulmonary embolus: On chronic anticoagulation with Xarelto. Continue.  . Leukocytosis: Likely related to c.diff colitis. Improved.  Marland Kitchen PAH (pulmonary artery hypertension): Pt to have ECHO in convalescent state and follow up with Dr. Wilnette Kales.  . Rib pain: Associated with Sickle Cell Crisis. Resolved/  Disposition and Follow-up:  Pt discharged in stable condition and to follow up in the office on 07/07/2014 with NP [facility].     Discharge Instructions    Activity as tolerated - No restrictions    Complete by:  As directed      Diet general    Complete by:  As directed            DISCHARGE EXAM:  General: Alert, awake, oriented x3, in mild distress.  Vital Signs: BP113/70, HR 80, T 98.1 F (36.7 C), temperature source Oral, RR 15, height 6' (1.829 m), weight 162 lb 4.8 oz (73.619 kg), SpO2 97 %. HEENT: [place]/AT PEERL, EOMI, anicteric Neck: Trachea midline, no masses, no thyromegal,y no JVD, no carotid bruit OROPHARYNX: Moist, No exudate/ erythema/lesions.  Heart: Regular rate and rhythm, without murmurs, rubs, gallops or S3. PMI non-displaced. Exam reveals no decreased pulses. Pulmonary/Chest: Normal effort. Breath sounds normal. No. Apnea. Clear to auscultation,no stridor,  no wheezing and no rhonchi noted. No respiratory distress and no tenderness noted. Abdomen: Soft, nontender, nondistended, normal bowel sounds, no masses no hepatosplenomegaly noted. No fluid wave and no ascites. There is no guarding or rebound. Neuro: Alert and oriented to person, place and time. Normal motor skills, Displays no atrophy or tremors and exhibits normal muscle tone.  No focal neurological deficits noted cranial nerves II through XII grossly intact. No sensory deficit noted. Strength at baseline in bilateral upper and lower extremities. Gait normal. Musculoskeletal: No warm swelling or erythema around  joints, no spinal tenderness noted. Psychiatric: Patient alert and oriented x3, good insight and cognition, good recent to remote recall. Skin: Skin is warm and dry. No bruising, no ecchymosis and no rash noted. Pt is not diaphoretic. No erythema. No  pallor Psychiatric: Mood, memory, affect and judgement normal     Recent Labs  06/30/14 0406 07/01/14 1110  NA 137 138  K 3.7 3.6  CL 108 105  CO2 22 24  GLUCOSE 113* 120*  BUN 7 <5*  CREATININE 0.53* 0.47*  CALCIUM 8.6* 8.8*    Recent Labs  06/29/14 0425 06/30/14 0406  AST 34 34  ALT 20 19  ALKPHOS 78 92  BILITOT 6.6* 5.1*  PROT 7.4 7.1  ALBUMIN 3.9 3.7   No results for input(s): LIPASE, AMYLASE in the last 72 hours.  Recent Labs  06/30/14 0406 07/01/14 1110  WBC 14.0* 13.9*  NEUTROABS 8.3* 8.0*  HGB 6.3* 7.0*  HCT 18.3* 19.7*  MCV 94.8 95.6  PLT 384 449*     Total time spent including face to face and decision making was greater than 30 minutes  Signed: Neysa Arts A. 07/01/2014, 1:49 PM

## 2014-07-04 LAB — TYPE AND SCREEN
ABO/RH(D): O POS
ANTIBODY SCREEN: NEGATIVE
Unit division: 0

## 2014-07-07 ENCOUNTER — Observation Stay (HOSPITAL_COMMUNITY)
Admission: AD | Admit: 2014-07-07 | Discharge: 2014-07-07 | Disposition: A | Discharge status: Home / Self Care | Payer: Medicare Other | Source: Ambulatory Visit | Attending: Internal Medicine | Admitting: Internal Medicine

## 2014-07-07 ENCOUNTER — Ambulatory Visit (INDEPENDENT_AMBULATORY_CARE_PROVIDER_SITE_OTHER): Payer: Medicare Other | Admitting: Family Medicine

## 2014-07-07 VITALS — BP 95/58 | HR 120 | Temp 98.7°F | Resp 14 | Ht 72.0 in | Wt 162.0 lb

## 2014-07-07 DIAGNOSIS — Z87891 Personal history of nicotine dependence: Secondary | ICD-10-CM | POA: Insufficient documentation

## 2014-07-07 DIAGNOSIS — M25473 Effusion, unspecified ankle: Secondary | ICD-10-CM | POA: Insufficient documentation

## 2014-07-07 DIAGNOSIS — Z23 Encounter for immunization: Secondary | ICD-10-CM

## 2014-07-07 DIAGNOSIS — Z86711 Personal history of pulmonary embolism: Secondary | ICD-10-CM | POA: Insufficient documentation

## 2014-07-07 DIAGNOSIS — D5700 Hb-SS disease with crisis, unspecified: Secondary | ICD-10-CM | POA: Diagnosis not present

## 2014-07-07 DIAGNOSIS — E876 Hypokalemia: Secondary | ICD-10-CM | POA: Diagnosis not present

## 2014-07-07 DIAGNOSIS — R9431 Abnormal electrocardiogram [ECG] [EKG]: Secondary | ICD-10-CM

## 2014-07-07 DIAGNOSIS — Z96641 Presence of right artificial hip joint: Secondary | ICD-10-CM | POA: Diagnosis not present

## 2014-07-07 DIAGNOSIS — R609 Edema, unspecified: Secondary | ICD-10-CM

## 2014-07-07 DIAGNOSIS — I10 Essential (primary) hypertension: Secondary | ICD-10-CM | POA: Diagnosis not present

## 2014-07-07 DIAGNOSIS — Z7901 Long term (current) use of anticoagulants: Secondary | ICD-10-CM | POA: Diagnosis not present

## 2014-07-07 DIAGNOSIS — Z833 Family history of diabetes mellitus: Secondary | ICD-10-CM | POA: Diagnosis not present

## 2014-07-07 DIAGNOSIS — G894 Chronic pain syndrome: Secondary | ICD-10-CM | POA: Diagnosis not present

## 2014-07-07 DIAGNOSIS — R0781 Pleurodynia: Secondary | ICD-10-CM

## 2014-07-07 LAB — COMPREHENSIVE METABOLIC PANEL
ALBUMIN: 3.7 g/dL (ref 3.5–5.0)
ALT: 15 U/L — AB (ref 17–63)
AST: 29 U/L (ref 15–41)
Alkaline Phosphatase: 65 U/L (ref 38–126)
Anion gap: 6 (ref 5–15)
BILIRUBIN TOTAL: 5.1 mg/dL — AB (ref 0.3–1.2)
BUN: 12 mg/dL (ref 6–20)
CHLORIDE: 108 mmol/L (ref 101–111)
CO2: 25 mmol/L (ref 22–32)
Calcium: 8.9 mg/dL (ref 8.9–10.3)
Creatinine, Ser: 0.54 mg/dL — ABNORMAL LOW (ref 0.61–1.24)
GFR calc Af Amer: >60 mL/min (ref >60)
GLUCOSE: 112 mg/dL — AB (ref 65–99)
Potassium: 4.2 mmol/L (ref 3.5–5.1)
Sodium: 139 mmol/L (ref 135–145)
TOTAL PROTEIN: 7.3 g/dL (ref 6.5–8.1)

## 2014-07-07 LAB — CBC WITH DIFFERENTIAL/PLATELET
BASOS ABS: 0.2 10*3/uL — AB (ref 0.0–0.1)
BASOS PCT: 1 % (ref 0–1)
EOS ABS: 0.4 10*3/uL (ref 0.0–0.7)
Eosinophils Relative: 2 % (ref 0–5)
HCT: 17.4 % — ABNORMAL LOW (ref 39.0–52.0)
Hemoglobin: 6 g/dL — CL (ref 13.0–17.0)
LYMPHS ABS: 3.6 10*3/uL (ref 0.7–4.0)
Lymphocytes Relative: 18 % (ref 12–46)
MCH: 33.3 pg (ref 26.0–34.0)
MCHC: 34.5 g/dL (ref 30.0–36.0)
MCV: 96.7 fL (ref 78.0–100.0)
MONO ABS: 1.8 10*3/uL — AB (ref 0.1–1.0)
Monocytes Relative: 9 % (ref 3–12)
NEUTROS ABS: 13.9 10*3/uL — AB (ref 1.7–7.7)
NEUTROS PCT: 70 % (ref 43–77)
PLATELETS: 515 10*3/uL — AB (ref 150–400)
RBC: 1.8 MIL/uL — ABNORMAL LOW (ref 4.22–5.81)
RDW: 20.7 % — ABNORMAL HIGH (ref 11.5–15.5)
WBC: 19.9 10*3/uL — ABNORMAL HIGH (ref 4.0–10.5)

## 2014-07-07 LAB — RETICULOCYTES
RBC.: 1.8 MIL/uL — AB (ref 4.22–5.81)
RETIC COUNT ABSOLUTE: 252 10*3/uL — AB (ref 19.0–186.0)
RETIC CT PCT: 14 % — AB (ref 0.4–3.1)

## 2014-07-07 LAB — LACTATE DEHYDROGENASE: LDH: 374 U/L — AB (ref 98–192)

## 2014-07-07 MED ORDER — HYDROMORPHONE HCL 4 MG PO TABS: 4.0000 mg | ORAL_TABLET | ORAL | Status: DC | PRN

## 2014-07-07 MED ORDER — MORPHINE SULFATE ER 30 MG PO TBCR: 30.0000 mg | EXTENDED_RELEASE_TABLET | Freq: Two times a day (BID) | ORAL | Status: DC

## 2014-07-07 MED ORDER — HEPARIN SOD (PORK) LOCK FLUSH 100 UNIT/ML IV SOLN
500.0000 [IU] | INTRAVENOUS | Status: AC | PRN
  Administered 2014-07-07: 500 [IU]
  Filled 2014-07-07: qty 5

## 2014-07-07 MED ORDER — HYDROMORPHONE HCL 2 MG/ML IJ SOLN
1.5000 mg | Freq: Once | INTRAMUSCULAR | Status: AC
  Administered 2014-07-07: 1.5 mg via INTRAVENOUS
  Filled 2014-07-07: qty 1

## 2014-07-07 MED ORDER — HYDROMORPHONE HCL 2 MG/ML IJ SOLN
2.0000 mg | Freq: Once | INTRAMUSCULAR | Status: AC
  Administered 2014-07-07: 2 mg via INTRAVENOUS
  Filled 2014-07-07: qty 1

## 2014-07-07 MED ORDER — KETOROLAC TROMETHAMINE 30 MG/ML IJ SOLN
30.0000 mg | Freq: Four times a day (QID) | INTRAMUSCULAR | Status: DC
  Administered 2014-07-07: 30 mg via INTRAVENOUS
  Filled 2014-07-07: qty 1

## 2014-07-07 MED ORDER — HYDROMORPHONE HCL 4 MG PO TABS
4.0000 mg | ORAL_TABLET | Freq: Once | ORAL | Status: AC
  Administered 2014-07-07: 4 mg via ORAL
  Filled 2014-07-07: qty 1

## 2014-07-07 MED ORDER — DEXTROSE-NACL 5-0.45 % IV SOLN
INTRAVENOUS | Status: DC
  Administered 2014-07-07: 15:00:00 via INTRAVENOUS

## 2014-07-07 MED ORDER — SODIUM CHLORIDE 0.9 % IJ SOLN
10.0000 mL | INTRAMUSCULAR | Status: AC | PRN
  Administered 2014-07-07: 10 mL

## 2014-07-07 MED ORDER — HYDROMORPHONE HCL 2 MG/ML IJ SOLN
1.7000 mg | Freq: Once | INTRAMUSCULAR | Status: AC
Start: 2014-07-07 — End: 2014-07-07
  Administered 2014-07-07: 1.7 mg via INTRAVENOUS
  Filled 2014-07-07: qty 1

## 2014-07-07 NOTE — Discharge Summary (Signed)
Physician Discharge Summary  [name] IOE:[id number] DOB: [birth year] DOA: 07/07/2014  PCP: [name],[name], MD  Admit date: 07/07/2014 Discharge date: 07/07/2014  Discharge Diagnoses:  Active Problems:   Sickle-cell with crisis   Discharge Condition: stable  Disposition: home     Follow-up Information    Follow up with [name],[name], MD.   Specialty:  Internal Medicine   Why:  As needed   Contact information:   [place] [address] [phone]       Diet:regular  Wt Readings from Last 3 Encounters:  07/07/14 162 lb (73.483 kg)  06/29/14 162 lb 4.8 oz (73.619 kg)  06/23/14 163 lb (73.936 kg)    History of present illness:  [name] is a 34yo male with sickle cell disease who presents to the day hospital with pain in his left ribs and flank that he rates a 7/10. Pt reports taking his home meds with no relief. He was seen in clinic today for an office visit and was noted to be in pain. He was sent over to the day hospital for further pain management. Pt denies other symptoms such as cough, CP, and dyspnea. quit. He denies HA, dizziness, fever, dysuria, weakness, vomiting, and diarrhea. In the clinic labs and Udip were ordered.  Hospital Course:  Pt was treated initially with weight based rapid re-dosing Dilaudid, IVF, and Toradol. His pain was at a 7/10, so he was then transitioned to a weight based Dilaudid PCA . Labs were checked and his Hgb was low at 6.0. Patient was re-evaluated and appeared improved and rated his pain as a 5-6/10. He wanted to try pain management at home. Thus he was discharged home, to continue home pain management. Pt was advised to return to clinic in 2 days to have a follow-up CBC and type and screen.   Discharge Exam:  Filed Vitals:   07/07/14 1420  BP: 111/66  Pulse: 91  Resp: 16   Filed Vitals:   07/07/14 1420  BP: 111/66  Pulse: 91  Resp: 16  SpO2: 92%   General: Alert, awake, oriented x3,  in no acute distress.  HEENT: [place]/AT PEERL, EOMI Neck: Trachea midline,  no masses, no thyromegal,y no JVD, no carotid bruit OROPHARYNX:  Moist, No exudate/ erythema/lesions.  Heart: Regular rate and rhythm, without murmurs, rubs, gallops Lungs: Clear to auscultation, no wheezing or rhonchi noted.  Abdomen: Soft, nontender, nondistended, positive bowel sounds, no masses no hepatosplenomegaly noted..  Neuro: No focal neurological deficits noted cranial nerves II through XII grossly intact.  Strength 5 out of 5 in bilateral upper and lower extremities. Musculoskeletal: No warm swelling or erythema around joints, no spinal tenderness noted. Psychiatric: Patient alert and oriented x3 Extremities: mild clubbing of fingers, no cyanosis, 1+ left ankle and foot edema    Discharge Instructions As above    Medication List    TAKE these medications        diltiazem 120 MG 24 hr capsule  Commonly known as:  CARDIZEM CD  Take 1 capsule (120 mg total) by mouth daily.     folic acid 1 MG tablet  Commonly known as:  FOLVITE  Take 1 tablet (1 mg total) by mouth every morning.     HYDROmorphone 4 MG tablet  Commonly known as:  DILAUDID  Take 1 tablet (4 mg total) by mouth every 4 (four) hours as needed for severe pain.     hydroxyurea 500 MG capsule  Commonly known as:  HYDREA  Take 3 capsules (1,500 mg total) by mouth daily. May take with food to minimize GI side effects.     lisinopril 10 MG tablet  Commonly known as:  PRINIVIL,ZESTRIL  Take 1 tablet (10 mg total) by mouth daily.     metoprolol tartrate 25 MG tablet  Commonly known as:  LOPRESSOR  Take 1 tablet (25 mg total) by mouth daily.     metroNIDAZOLE 500 MG tablet  Commonly known as:  FLAGYL  Take 1 tablet (500 mg total) by mouth every 8 (eight) hours.     morphine 30 MG 12 hr tablet  Commonly known as:  MS CONTIN  Take 1 tablet (30 mg total) by mouth every 12 (twelve) hours.     potassium chloride SA 20 MEQ tablet   Commonly known as:  K-DUR,KLOR-CON  Take 1 tablet (20 mEq total) by mouth every morning.     rivaroxaban 20 MG Tabs tablet  Commonly known as:  XARELTO  Take 1 tablet (20 mg total) by mouth every morning.     Vitamin D 2000 UNITS tablet  Take 1 tablet (2,000 Units total) by mouth daily.     zolpidem 10 MG tablet  Commonly known as:  AMBIEN  Take 1 tablet (10 mg total) by mouth at bedtime as needed for sleep.          The results of significant diagnostics from this hospitalization (including imaging, microbiology, ancillary and laboratory) are listed below for reference.    Significant Diagnostic Studies: Dg Chest 2 View  06/25/2014   CLINICAL DATA:  Sickle cell crisis, chest pain.  EXAM: CHEST  2 VIEW  COMPARISON:  Chest radiograph Jun 23, 2014  FINDINGS: Moderate cardiomegaly, unchanged from prior examination. Mediastinal silhouette is nonsuspicious. Diffuse slightly increasing interstitial prominence without pleural effusion or focal consolidation. No pneumothorax. 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 Single lumen LEFT chest Port-A-Cath with distal tip at cavoatrial junction.  IMPRESSION: Similar cardiomegaly. Mildly increasing interstitial prominence (atypical infection or pulmonary edema) without focal consolidation.   Electronically Signed   By: Elon Alas M.D.   On: 06/25/2014 04:17   Dg Chest 2 View  06/24/2014   CLINICAL DATA:  Left flank pain, weakness, shortness of breath for 1 day, history of sickle cell disease  EXAM: CHEST  2 VIEW  COMPARISON:  Chest x-ray of 06/03/2014  FINDINGS: There is little change in cardiomegaly with minimal fullness of the perihilar vasculature. No frank congestive heart failure seen and no effusion is noted. Left Port-A-Cath is present with the tip seen to the expected SVC -RA junction. No bony abnormality is noted.  IMPRESSION: No significant change in cardiomegaly and perhaps minimal pulmonary  vascular congestion. No effusion   Electronically Signed   By: Ivar Drape M.D.   On: 06/24/2014 08:26    Microbiology: No results found for this or any previous visit (from the past 240 hour(s)).   Labs: Basic Metabolic Panel:  Recent Labs Lab 07/01/14 1110 07/07/14 1452  NA 138 139  K 3.6 4.2  CL 105 108  CO2 24 25  GLUCOSE 120* 112*  BUN <5* 12  CREATININE 0.47* 0.54*  CALCIUM 8.8* 8.9   Liver Function Tests:  Recent Labs Lab 07/07/14 1452  AST 29  ALT 15*  ALKPHOS 65  BILITOT 5.1*  PROT 7.3  ALBUMIN 3.7   No results for input(s): LIPASE, AMYLASE in the last 168 hours. No  results for input(s): AMMONIA in the last 168 hours. CBC:  Recent Labs Lab 07/01/14 1110 07/07/14 1452  WBC 13.9* 19.9*  NEUTROABS 8.0* 13.9*  HGB 7.0* 6.0*  HCT 19.7* 17.4*  MCV 95.6 96.7  PLT 449* 515*    Time total:>55 min  Signed:  Kalman Shan  07/07/2014, 4:42 PM

## 2014-07-07 NOTE — H&P (Signed)
[name] DOB: 11/02/1980 DOA: 07/07/2014   PCP: [name],[name], MD   Chief Complaint: pain  [name] is a 34yo male with sickle cell disease who presents to the day hospital with pain in his left ribs and flank that he rates a 7/10. Pt reports taking his home meds with no relief. He was seen in clinic today for an office visit and was noted to be in pain. He was sent over to the day hospital for further pain management. Pt denies other symptoms such as cough, CP, and dyspnea. quit. He denies HA, dizziness, fever, dysuria, weakness, vomiting, and diarrhea. In the clinic labs and Udip were ordered.  Review of Systems:  Constitutional: No weight loss, night sweats, fatigue. No fevers or chills HEENT: No headaches, dizziness, seizures, vision changes, difficulty swallowing,Tooth/dental problems,Sore throat, No sneezing, itching, ear ache, nasal congestion, post nasal drip,  Cardio-vascular: No chest pain, Orthopnea, PND, swelling in lower extremities, anasarca, dizziness, palpitations  GI: No heartburn, indigestion, abdominal pain, nausea, vomiting, diarrhea, change in bowel habits, loss of appetite  Resp: No shortness of breath with exertion or at rest. No excess mucus, no productive cough. No coughing up of blood.No change in color of mucus.No wheezing.No chest wall deformity  Skin: no rash or lesions.  GU: no dysuria, change in color of urine, no urgency or frequency. + flank pain.  Musculoskeletal: No joint pain or swelling. No decreased range of motion. No back pain. +rib, leg, and shoulder pain Psych: No change in mood or affect. No depression or anxiety. No memory loss.    Past Medical History  Diagnosis Date  . Sickle cell anemia   . Blood transfusion   . Acute embolism and thrombosis of right internal jugular vein   . Hypokalemia   . Mood disorder   . History of pulmonary embolus (PE)   .  Avascular necrosis   . Leukocytosis     Chronic  . Thrombocytosis     Chronic  . Hypertension   . History of Clostridium difficile infection   . Uses marijuana   . Chronic anticoagulation   . Functional asplenia   . Former smoker   . Second hand tobacco smoke exposure   . Alcohol consumption of one to four drinks per day   . Noncompliance with medication regimen   . Sickle-cell crisis with associated acute chest syndrome 05/13/2013  . Acute chest syndrome 06/18/2013  . Demand ischemia 01/02/2014   Past Surgical History  Procedure Laterality Date  . Right hip replacement      08/2006  . Cholecystectomy      01/2008  . Porta cath placement    . Porta cath removal    . Umbilical hernia repair      01/2008  . Excision of left periauricular cyst      10/2009  . Excision of right ear lobe cyst with primary closur      11/2007  . Portacath placement  01/05/2012    Procedure: INSERTION PORT-A-CATH;  Surgeon: Odis Hollingshead, MD;  Location: [facility];  Service: General;  Laterality: N/A;  ultrasound guiced port a cath insertion with fluoroscopy   Social History:  reports that he quit smoking about 4 years ago. He has never used smokeless tobacco. He reports that he uses illicit drugs (Marijuana) about twice per week. He reports that he does not drink alcohol.  No Known Allergies  Family History  Problem Relation Age of Onset  . Sickle cell trait Mother   . Depression Mother   . Diabetes Mother   . Sickle cell trait Father   . Sickle cell trait Brother    No current facility-administered medications on file prior to encounter.   Current Outpatient Prescriptions on File Prior to Encounter  Medication Sig Dispense Refill  . Cholecalciferol (VITAMIN D) 2000 UNITS tablet Take 1 tablet (2,000 Units total) by mouth daily. (Patient not taking: Reported on 07/07/2014) 90 tablet 3  . diltiazem (CARDIZEM CD) 120 MG 24 hr capsule Take 1 capsule (120 mg total) by mouth daily. 30 capsule 1  .  folic acid (FOLVITE) 1 MG tablet Take 1 tablet (1 mg total) by mouth every morning. 30 tablet 11  . HYDROmorphone (DILAUDID) 4 MG tablet Take 1 tablet (4 mg total) by mouth every 4 (four) hours as needed for severe pain. 90 tablet 0  . hydroxyurea (HYDREA) 500 MG capsule Take 3 capsules (1,500 mg total) by mouth daily. May take with food to minimize GI side effects. 60 capsule 3  . lisinopril (PRINIVIL,ZESTRIL) 10 MG tablet Take 1 tablet (10 mg total) by mouth daily. 30 tablet 1  . metoprolol tartrate (LOPRESSOR) 25 MG tablet Take 1 tablet (25 mg total) by mouth daily. 30 tablet 6  . metroNIDAZOLE (FLAGYL) 500 MG tablet Take 1 tablet (500 mg total) by mouth every 8 (eight) hours. (Patient not taking: Reported on 07/07/2014) 15 tablet 0  . morphine (MS CONTIN) 30 MG 12 hr tablet Take 1 tablet (30 mg total) by mouth every 12 (twelve) hours. 60 tablet 0  . potassium chloride SA (K-DUR,KLOR-CON) 20 MEQ tablet Take 1 tablet (20 mEq total) by mouth every morning. 30 tablet 3  . rivaroxaban (XARELTO) 20 MG TABS tablet Take 1 tablet (20 mg total) by mouth every morning. 30 tablet 2  . zolpidem (AMBIEN) 10 MG tablet Take 1 tablet (10 mg total) by mouth at bedtime as needed for sleep. 30 tablet 0      Physical Exam: Filed Vitals:   07/07/14 1420  BP: 111/66  Pulse: 91  Resp: 16  SpO2: 92%   General: Alert, awake, oriented x3, in no acute distress.  HEENT: [place]/AT PEERL, EOMI, nl oropharynx Neck: Trachea midline,  no masses, no thyromegal,y no JVD, no carotid bruit OROPHARYNX:  Moist, No exudate/ erythema/lesions.  Heart: Regular rate and rhythm, without murmurs, rubs, gallops Lungs: Clear to auscultation, no wheezing or rhonchi noted.  Abdomen: Soft, nontender, nondistended, positive bowel sounds, no masses no hepatosplenomegaly noted. Neuro: No focal neurological deficits noted cranial nerves II through XII grossly intact. Nl baseline gait MSK: no warmth, swelling, or erythema of  joints Extremities: mild clubbing of fingers, no cyanosis, left ankle and foot edema   Labs on Admission:   Basic Metabolic Panel:  Recent Labs Lab 07/01/14 1110  NA 138  K 3.6  CL 105  CO2 24  GLUCOSE 120*  BUN <5*  CREATININE 0.47*  CALCIUM 8.8*   Liver Function Tests: No results for input(s): AST, ALT, ALKPHOS, BILITOT, PROT, ALBUMIN in the last 168 hours. CBC:  Recent Labs Lab 07/01/14 1110  WBC 13.9*  NEUTROABS 8.0*  HGB 7.0*  HCT 19.7*  MCV 95.6  PLT 449*   BNP: Invalid input(s): POCBNP   Radiological Exams on Admission: No results found.   Assessment/Plan: Active Problems:   Sickle-cell with crisis Pt will be treated with initially treated with weight based rapid re-dosing Dilaudid,  IVF and Toradol . If pain is above 7/10 then Pt will be transitioned to a weight based Dilaudid PCA . Patient will be re-evaluated for pain in the context of function and relationship to baseline as care progresses.  Time spend: 40 min Code Status: full Family Communication:  none Disposition Plan: pending improvement  Kalman Shan, MD  Pager [phone]  If 7PM-7AM, please contact night-coverage [email or web address] Password [facility] 07/07/2014, 2:47 PM

## 2014-07-07 NOTE — Progress Notes (Signed)
Subjective:    Patient ID: LANG ZINGG, male    DOB: 04-26-1980, 34 y.o.   MRN: [id number]  HPI  Mr. Amer Alcindor, a 34 year old male with a history of sickle cell anemia, HbSS presents for a follow-up of sickle cell anemia and medication management. Mr. Klecka reports that he has 7/10 pain to left ribs that is consistent with a sickle cell crisis. Mr. Calix describes pain as constant and sharp. Patient states that pain started 1 day ago. 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 Patient last had Dilaudid 4 mg this am with minimal relief.   Patient currently reports left rib pain and fatigue. Mr. Ledwith denies shortness of breath, chest pains, nausea, vomiting, and diarrhea.   Past Medical History  Diagnosis Date  . Sickle cell anemia   . Blood transfusion   . Acute embolism and thrombosis of right internal jugular vein   . Hypokalemia   . Mood disorder   . History of pulmonary embolus (PE)   . Avascular necrosis   . Leukocytosis     Chronic  . Thrombocytosis     Chronic  . Hypertension   . History of Clostridium difficile infection   . Uses marijuana   . Chronic anticoagulation   . Functional asplenia   . Former smoker   . Second hand tobacco smoke exposure   . Alcohol consumption of one to four drinks per day   . Noncompliance with medication regimen   . Sickle-cell crisis with associated acute chest syndrome 05/13/2013  . Acute chest syndrome 06/18/2013  . Demand ischemia 01/02/2014   History   Social History  . Marital Status: Single    Spouse Name: N/A  . Number of Children: 0  . Years of Education: 13   Occupational History  . 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 Smoking status: Former Smoker -- 13 years    Quit date: 07/08/2010  . Smokeless tobacco: Never Used  . Alcohol Use: No  . Drug Use: 2.00 per week    Special:  Marijuana  . Sexual Activity:    Partners: Female    Museum/gallery curator: None     Comment: month ago   Other Topics Concern  . Not on file   Social History Narrative   Lives in an apartment.  Single.  Lives alone but has a girlfriend that helps care for him.  Does not use any assist devices.        [name]:  [phone] Mom, emergency contact  No Known Allergies Review of Systems  Constitutional: Negative for fever, fatigue and unexpected weight change.  HENT: Negative.   Eyes: Negative.   Respiratory: Negative.   Cardiovascular: Positive for leg swelling (left ankle swelling).  Gastrointestinal: Negative.  Negative for nausea and diarrhea.  Endocrine: Negative.  Negative for polydipsia, polyphagia and polyuria.  Genitourinary: Negative.   Musculoskeletal: Positive for myalgias (left rib pain).  Skin: Negative.   Allergic/Immunologic: Negative.   Neurological: Negative.  Negative for dizziness.  Hematological: Negative.   Psychiatric/Behavioral: Negative.       Objective:   Physical Exam  Constitutional: He is oriented to person, place, and time.  HENT:  Head: Normocephalic and atraumatic.  Right Ear: External ear normal.  Left Ear: External ear normal.  Mouth/Throat: Oropharynx is clear and moist.  Eyes: Conjunctivae  and EOM are normal. Pupils are equal, round, and reactive to light.  Neck: Normal range of motion. Neck supple.  Cardiovascular: Normal rate, regular rhythm and intact distal pulses.   Left ankle edema, 2+ pitting edema  Pulmonary/Chest: Effort normal and breath sounds normal.  Abdominal: Soft. Bowel sounds are normal.  Musculoskeletal: Normal range of motion.  Neurological: He is alert and oriented to person, place, and time. He has normal reflexes.  Skin: Skin is warm and dry.  Psychiatric: He has a normal mood and affect. His behavior is normal. Judgment and thought content normal.      BP 95/58 mmHg  Pulse 120  Temp(Src) 98.7 F  (37.1 C) (Oral)  Resp 14  Ht 6' (1.829 m)  Wt 162 lb (73.483 kg)  BMI 21.97 kg/m2  SpO2 90% Assessment & Plan:  1. Hb-SS disease with crisis Patient is current experiencing 7/10 pain to left ribs, that is consistent with his sickle cell crisis. Patient will transition to day infusion center for IV fluids and pain management.  Continue Hydrea 1500 mg daily consistentyly. Reviewed recent CBC w/differential and reticulocyte results. Will continue current Hydrea regimen. We discussed the need for good hydration, monitoring of hydration status, avoidance of heat, cold, stress, and infection triggers. We discussed the risks and benefits of Hydrea, including bone marrow suppression, the possibility of GI upset, skin ulcers, hair thinning, and teratogenicity. The patient was reminded of the need to seek medical attention of any symptoms of bleeding, anemia, or infection. Continue folic acid 1 mg daily to prevent aplastic bone marrow crises.   Will evaluate patient for proteinuria, BUN, and creatinine.    Pulmonary evaluation - Patient denies severe recurrent wheezes, shortness of breath with exercise, or persistent cough. If these symptoms develop, pulmonary function tests with spirometry will be ordered, and if abnormal, plan on referral to Pulmonology for further evaluation.  Cardiac - Abnormal EKG and echocardiogram. Patient was referred to cardiology on 01/16/2014 by Dr. Zigmund Daniel. He was a no show for 3 scheduled appointments.    Eye - High risk of proliferative retinopathy. Annual eye exam with retinal exam recommended to patient. Sent opthalmology      Immunization status - Patient will receive Prevnar vaccination on today. Patient is unsure of meningococcal vaccination, will check [facility] as it becomes available.   Acute and chronic painful episodes - We agreed on continuing MS contin 30 mg every 12 hours # 60 tablets. Will not titrate medications today based on current pain intensity and acute  left rib pain. Will also continue Dilaudid 4 mg every 4 hours as needed for moderate to severe pain #90. Patient is currently at 156 milli equivalents of morphine per day, will discuss weaning opiate medications at follow up in 1 month. I will also send a referral to pain management on today.  We discussed that pt is to receive her Schedule II prescriptions only from [place]. Pt is also aware that the prescription history is available to [place] online through the [facility] CSRS. Controlled substance agreement signed 07/08/2014. We reminded Mr. Fiumara  that all patients receiving Schedule II narcotics must be seen for follow within one month of prescription being requested. We reviewed the terms of our pain agreement, including the need to keep medicines in a safe locked location away from children or pets, and the need to report excess sedation or constipation, measures to avoid constipation, and policies related to early refills and stolen prescriptions. According to the [place] [facility] Initiative  program, we have reviewed details related to analgesia, adverse effects, aberrant behaviors.  Iron overload from chronic transfusion.  Patient currently does not require frequent transfusions. Will check ferritin levels.   - EKG 12-Lead - Lactate Dehydrogenase - POCT urinalysis dipstick - Ferritin - COMPLETE METABOLIC PANEL WITH GFR - CBC with Differential - morphine (MS CONTIN) 30 MG 12 hr tablet; Take 1 tablet (30 mg total) by mouth every 12 (twelve) hours.  Dispense: 60 tablet; Refill: 0 - Pneumococcal conjugate vaccine 13-valent - HYDROmorphone (DILAUDID) 4 MG tablet; Take 1 tablet (4 mg total) by mouth every 4 (four) hours as needed for severe pain.  Dispense: 90 tablet; Refill: 0  2. Chronic pain syndrome - Prescription Monitoring Profile (17)-[facility] - morphine (MS CONTIN) 30 MG 12 hr tablet; Take 1 tablet (30 mg total) by mouth every 12 (twelve) hours.  Dispense: 60 tablet; Refill: 0 - Ambulatory referral to  Pain Clinic  3. Rib pain Patient will transition to day infusion center for extended observation and treatment for 7/10 pain to left rib.   4. Ankle edema Patient reports that he awakened with left ankle edema on 07/06/2014. 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 Recommend that Mr. Duval elevate lower extremities to heart level while at rest. Will check kidney functioning and electrolyte levels.    5. EKG, abnormal Patient continues to have an abnormal EKG. Reviewed previous echocardiogram performed in March, 2016. Patient has previously been referred to cardiology and has missed several appointments. Will re-send referral to cardiology.  Mr. Culotta and I discussed the importance following up with cardiology. His last referral was sent on 01/17/2015  6. Immunization due  - Pneumococcal conjugate vaccine 13-valent      Dorena Dew, FNP

## 2014-07-07 NOTE — Progress Notes (Signed)
CRITICAL VALUE ALERT  Critical value received:  HGB 6.0 Date of notification:  07/07/14  Time of notification:  6761  Critical value read back:Yes.    Nurse who received alert:  Rosalene Billings RN  MD notified (1st page):  Agboola  Time of first page:  1540  MD notified (2nd page):  Time of second page:  Responding MD: Alben Deeds  Time MD responded:  [phone]

## 2014-07-07 NOTE — Progress Notes (Signed)
Pt discharged to home; discharge instructions explained, given, and signed; all questions answered; pt alert, oriented, and ambulatory; no complications noted 

## 2014-07-08 LAB — FERRITIN: Ferritin: 1896 ng/mL — ABNORMAL HIGH (ref 22–322)

## 2014-07-08 IMAGING — US IR FLUORO GUIDE CV LINE*R*
1 series · 1 of 1 positions shown · non-contrast
Comparison: none

CLINICAL DATA: Sickle cell crisis, needs venous access

PICC PLACEMENT WITH ULTRASOUND AND FLUOROSCOPY
TECHNIQUE: After written informed consent was obtained, patient was
placed in the supine position on angiographic table. Patency of the
right brachial vein was confirmed with ultrasound with image
documentation. An appropriate skin site was determined. Skin site
was marked. Region was prepped using maximum barrier technique
including cap and mask, sterile gown, sterile gloves, large sterile
sheet, and Chlorhexidine   as cutaneous antisepsis.  The region was
infiltrated locally with 1% lidocaine.   Under real-time ultrasound
guidance, the right brachial vein was accessed with a 21 gauge
micropuncture needle; the needle tip within the vein was confirmed
with ultrasound image documentation.   Needle exchanged over a 018
guidewire for a peel-away sheath, through which a 5-French single-
lumen power injectable PICC trimmed to 43cm was advanced,
positioned with its tip near the cavoatrial junction.  Spot chest
radiograph confirms appropriate catheter position.  Catheter was
flushed per protocol and secured externally with 0-Prolene sutures.
The patient tolerated procedure well, with no immediate
complication.

[Series 1: ir fluoro guide cv line*right* · 1 of 1 slices shown]
[im 1/1]
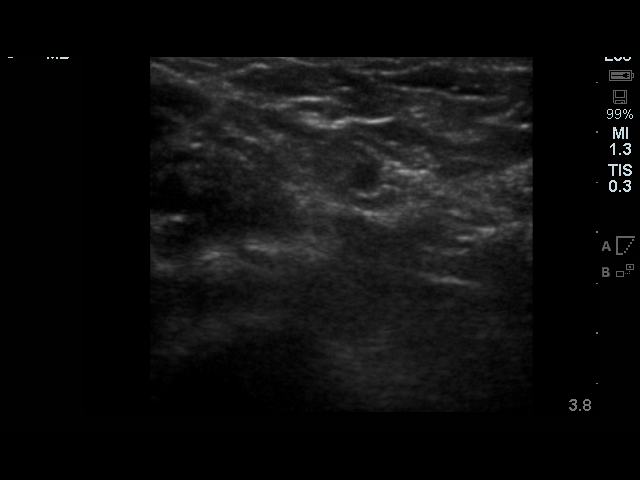

[1 of 1 positions shown; findings below may reference images not displayed]

IMPRESSION: Technically successful five French single lumen power injectable
PICC placement

## 2014-07-09 ENCOUNTER — Telehealth: Payer: Self-pay | Admitting: Internal Medicine

## 2014-07-09 DIAGNOSIS — G4700 Insomnia, unspecified: Secondary | ICD-10-CM

## 2014-07-09 MED ORDER — ZOLPIDEM TARTRATE 10 MG PO TABS: 10.0000 mg | ORAL_TABLET | Freq: Every evening | ORAL | Status: DC | PRN

## 2014-07-09 NOTE — Telephone Encounter (Signed)
Refill request for ambien 10mg . LOV 07/07/2014. Please advise. Thanks!

## 2014-07-09 NOTE — Telephone Encounter (Signed)
Reviewed [place] Substance Reporting system prior to reorder, no inconsistencies noted. Will order Ambien 10 mg PRN HS #30.   Meds ordered this encounter  Medications  . zolpidem (AMBIEN) 10 MG tablet    Sig: Take 1 tablet (10 mg total) by mouth at bedtime as needed for sleep.    Dispense:  30 tablet    Refill:  0    Order Specific Question:  Supervising Provider    Answer:  Liston Alba A [[id number]]    Dorena Dew, FNP

## 2014-07-10 ENCOUNTER — Encounter: Payer: Self-pay | Admitting: Family Medicine

## 2014-07-10 IMAGING — CR DG SHOULDER 2+V*L*
3 series · 3 of 3 positions shown · non-contrast
Comparison: 06/09/2010

CLINICAL DATA: Left shoulder pain.

LEFT SHOULDER - 2+ VIEW

[w shoulder ap internal left *]
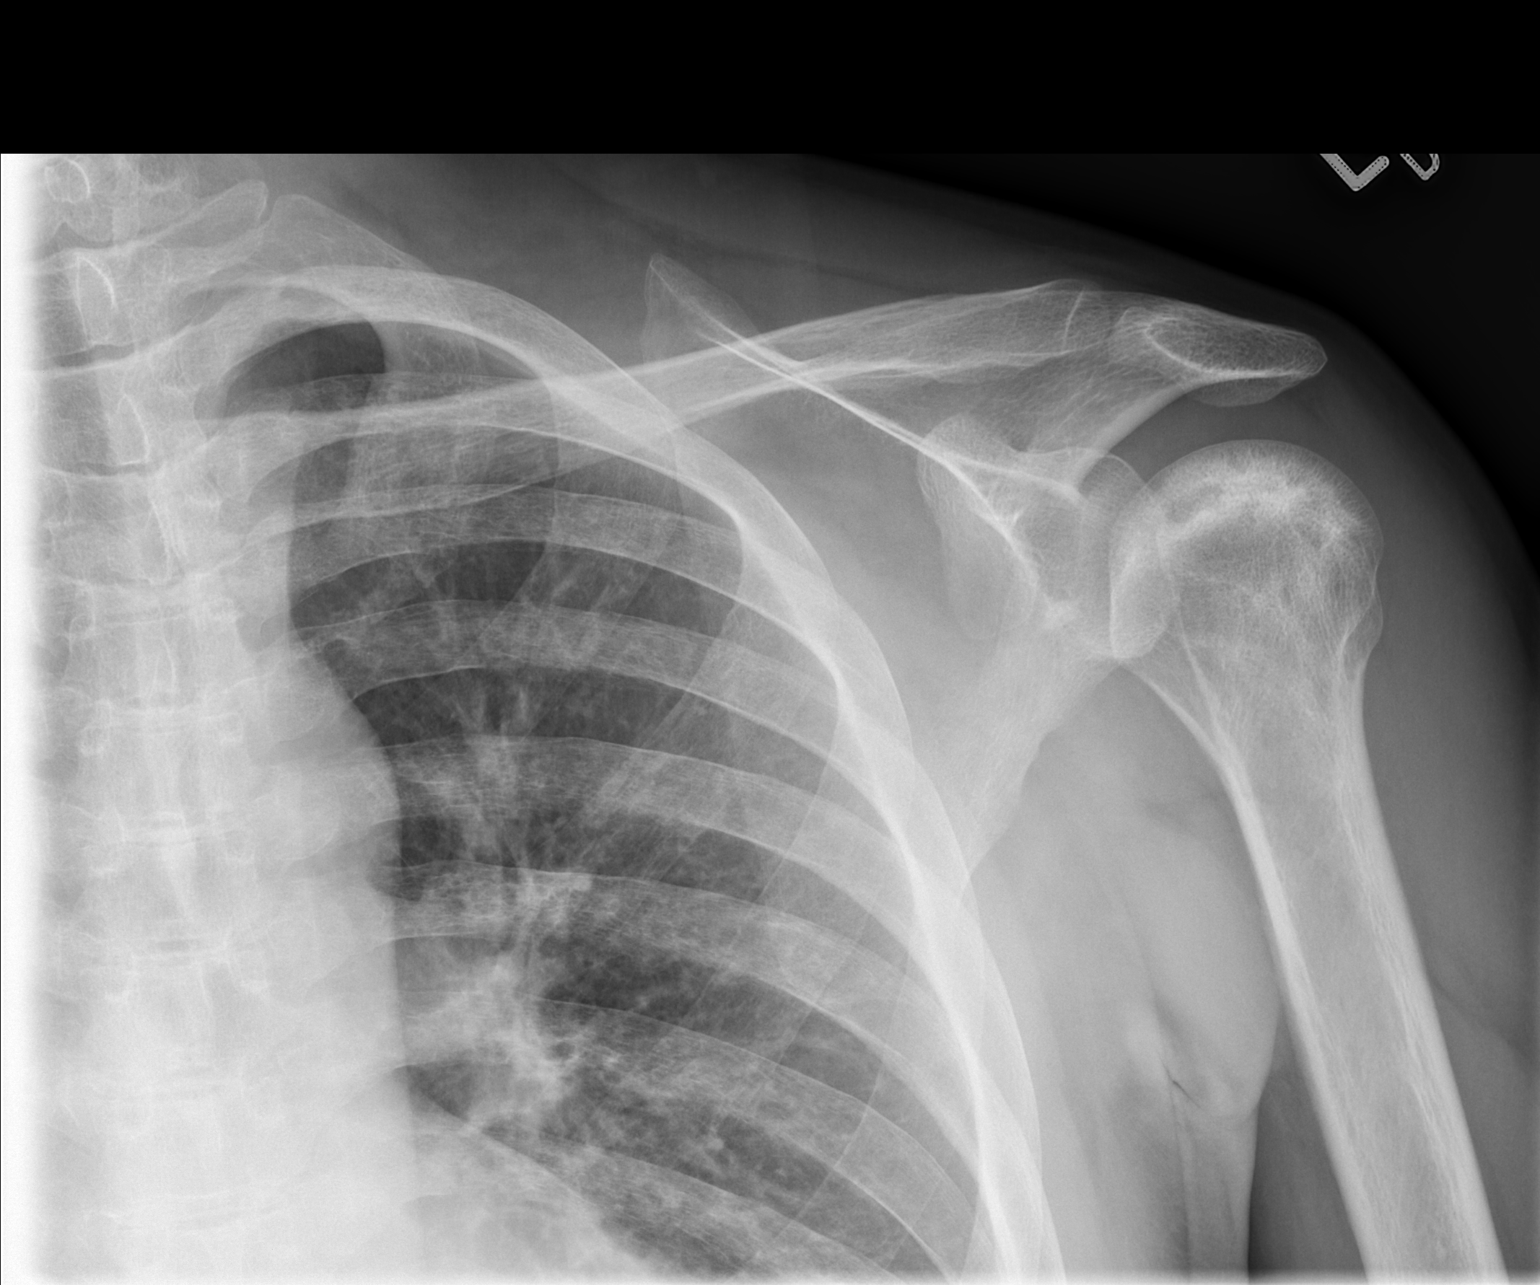

[w shoulder ap external left]
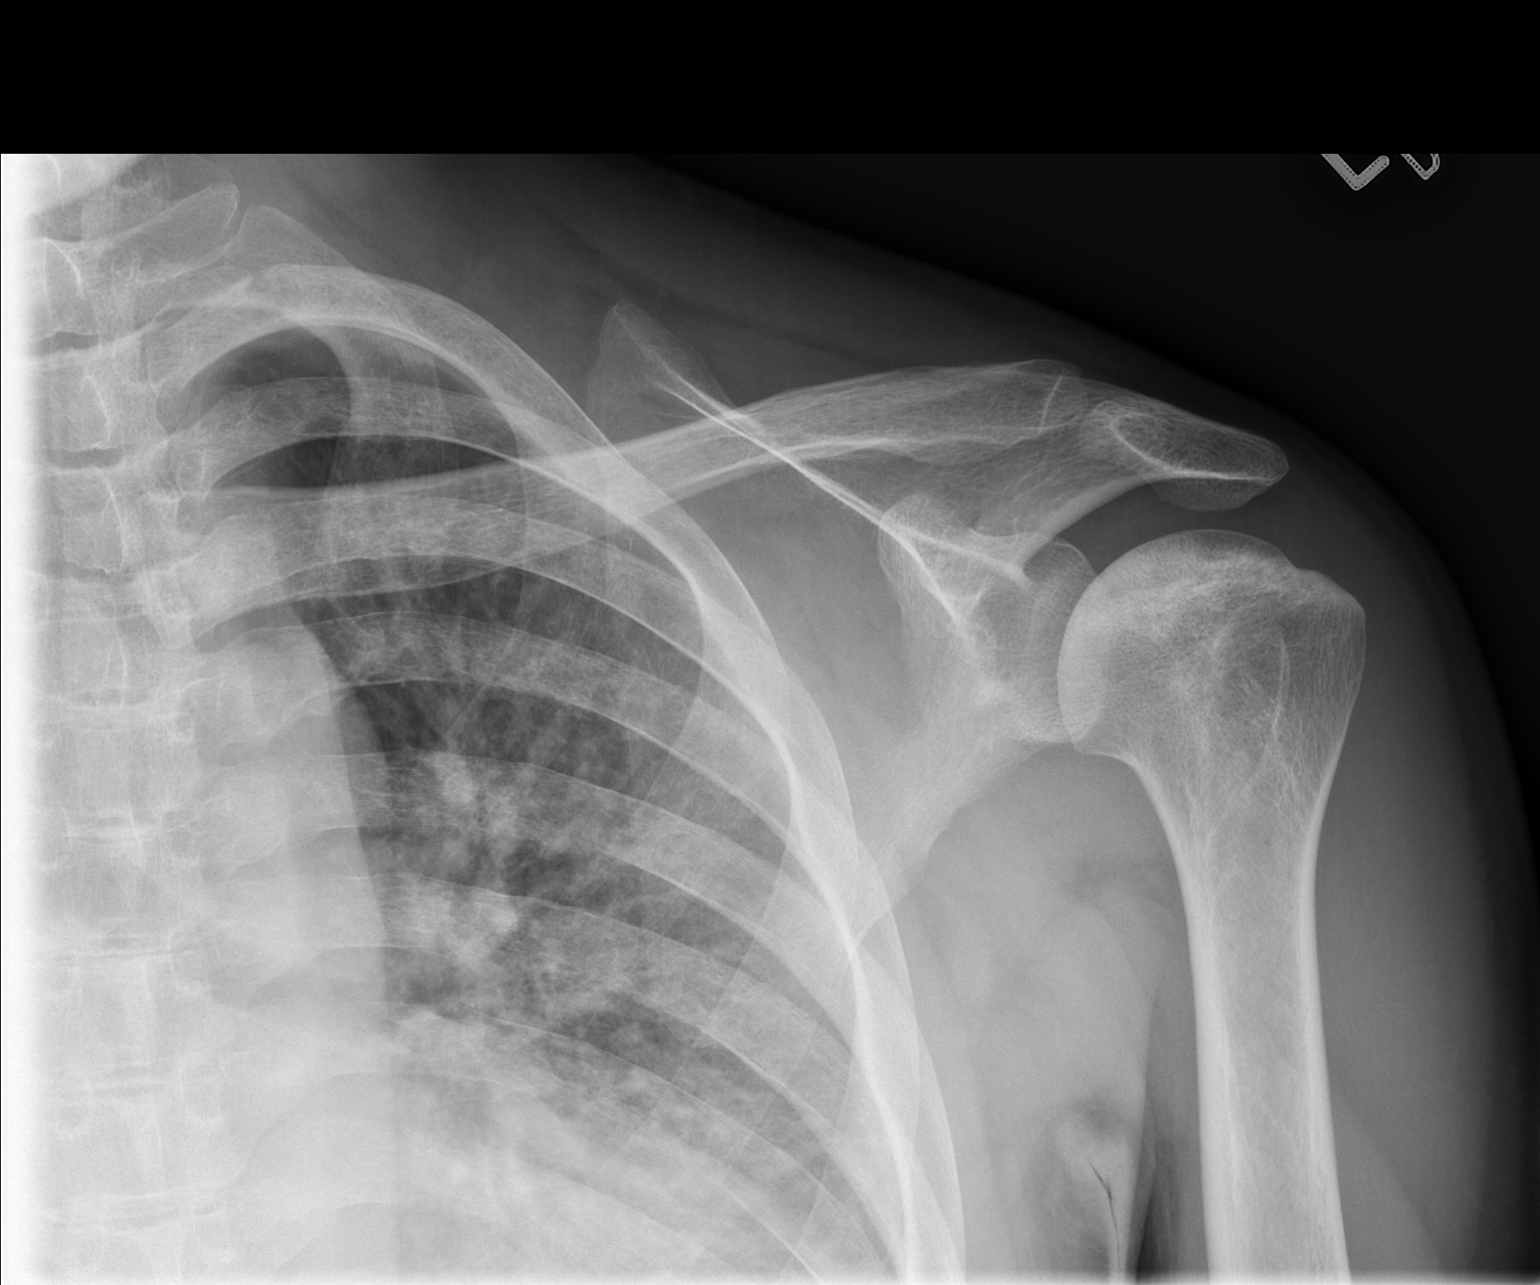

[w shoulder y view left]
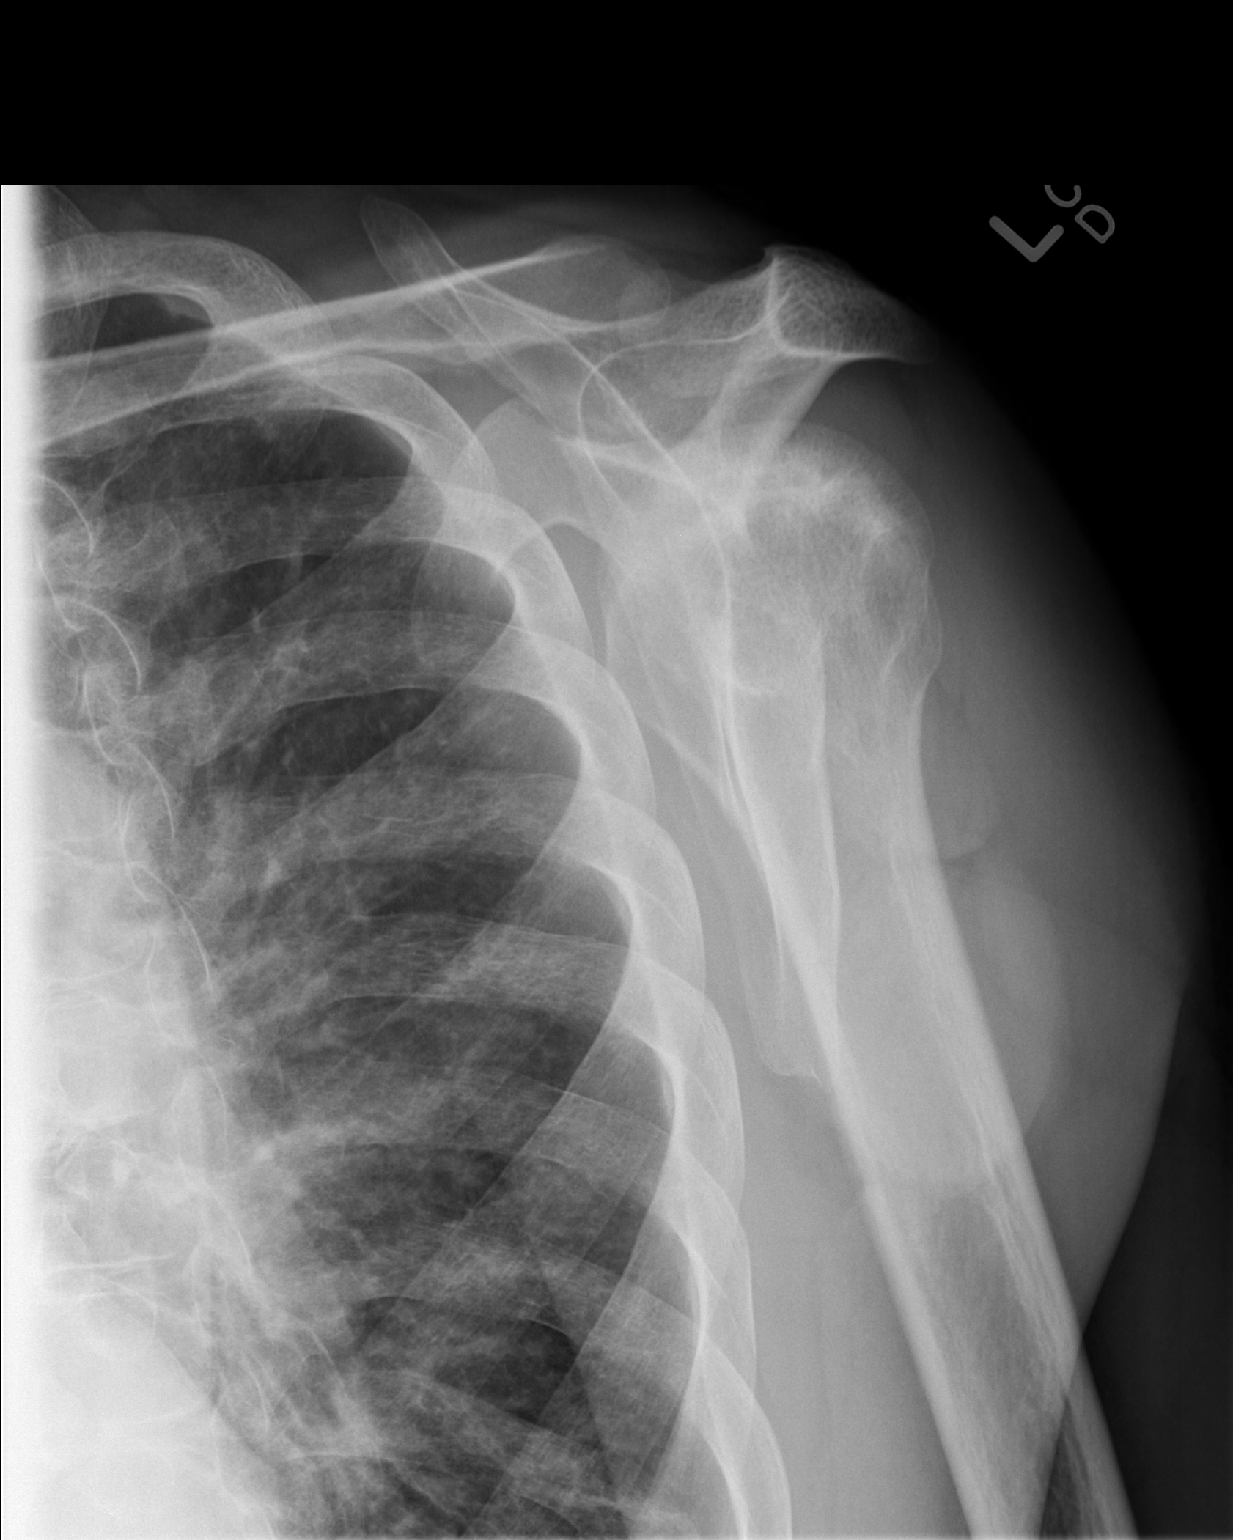

[3 of 3 positions shown; findings below may reference images not displayed]

FINDINGS: Stable sclerosis in the left humeral head is compatible
with avascular necrosis.  No collapse/fracture is observed.  The
overall appearance is stable.

The scapula and clavicle appear unremarkable.
IMPRESSION: 1.  Stable sclerosis in the left humeral head compatible with
avascular necrosis, without collapse or complicating feature.

## 2014-07-10 IMAGING — CR DG CHEST 2V
2 series · 2 of 2 positions shown · non-contrast
Comparison: 07/27/2011

CLINICAL DATA: Left shoulder pain.

CHEST - 2 VIEW

[w chest pa]
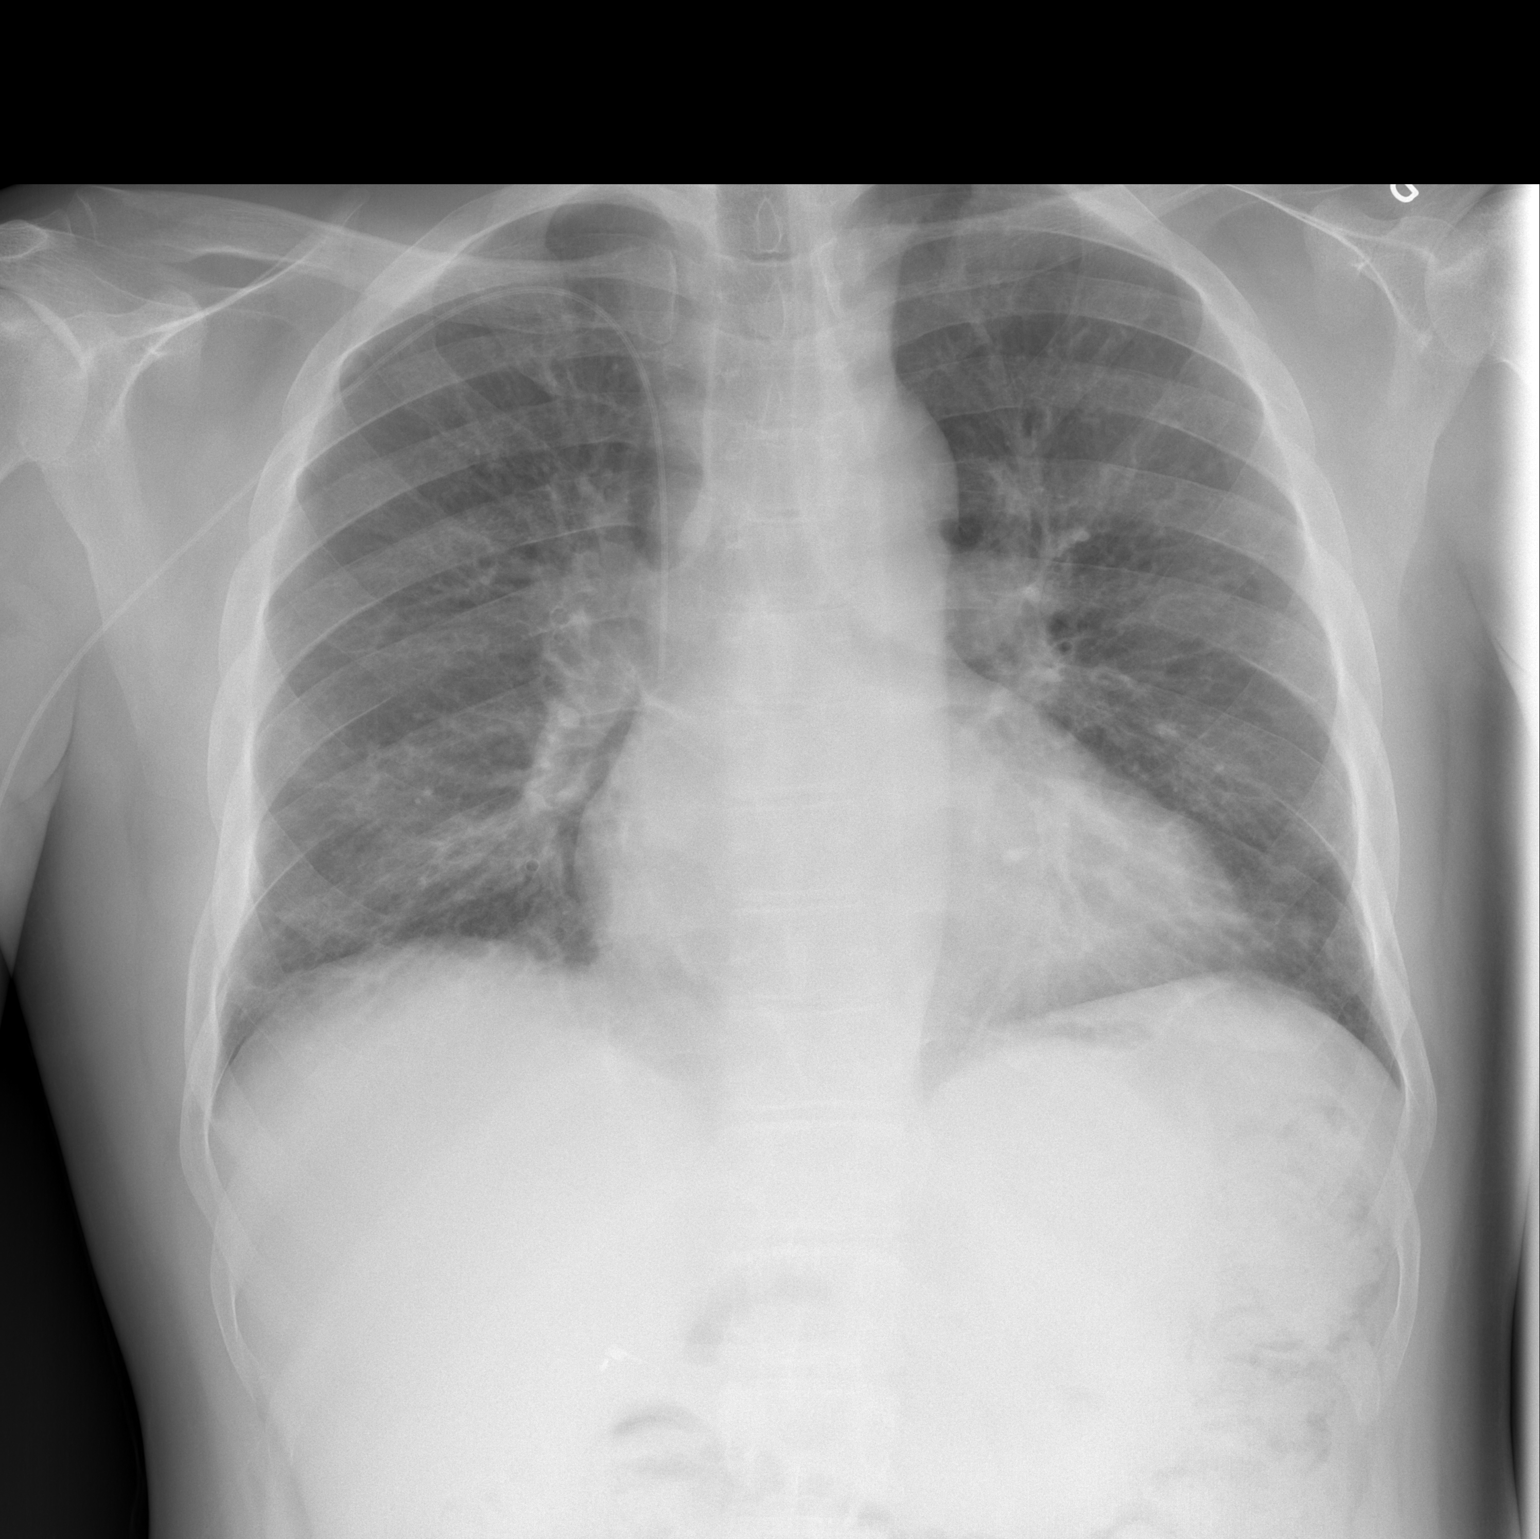

[w chest lat]
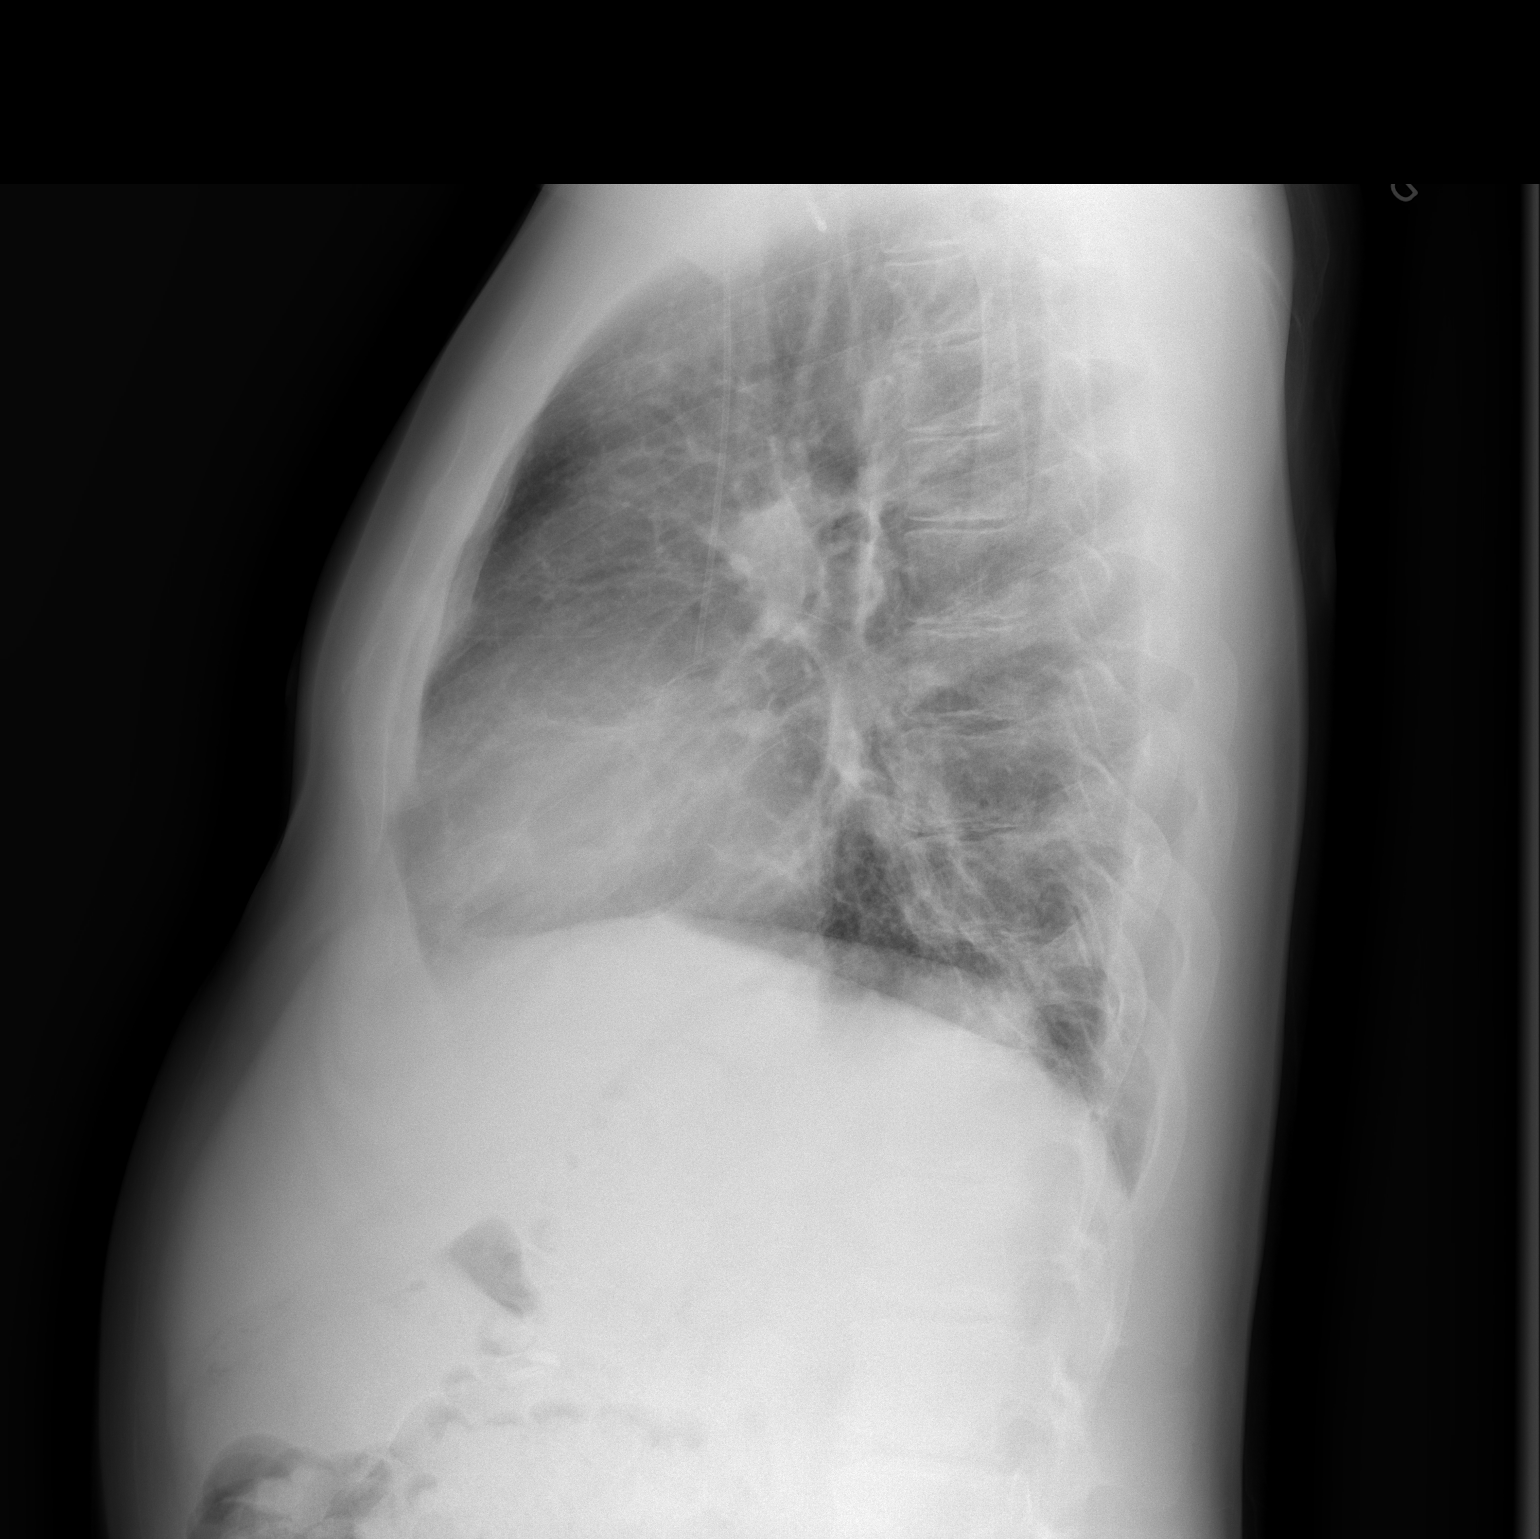

[2 of 2 positions shown; findings below may reference images not displayed]

FINDINGS: Right-sided PICC line tip projects over the SVC.

Mild cardiomegaly noted without edema.  Scarring or subsegmental
atelectasis noted at the right lung base.
IMPRESSION: 1.  Scarring or subsegmental atelectasis at the right lung base.
2.  Right-sided PICC line tip:  SVC.
3.  Stable cardiomegaly.

## 2014-07-11 LAB — BENZODIAZEPINES (GC/LC/MS), URINE
Alprazolam metabolite (GC/LC/MS), ur confirm: NEGATIVE ng/mL (ref <25)
Clonazepam metabolite (GC/LC/MS), ur confirm: 267 ng/mL — AB (ref <25)
Flurazepam metabolite (GC/LC/MS), ur confirm: NEGATIVE ng/mL (ref <50)
Lorazepam (GC/LC/MS), ur confirm: NEGATIVE ng/mL (ref <50)
MIDAZOLAMU: NEGATIVE ng/mL (ref <50)
Nordiazepam (GC/LC/MS), ur confirm: NEGATIVE ng/mL (ref <50)
OXAZEPAMU: NEGATIVE ng/mL (ref <50)
TRIAZOLAMU: NEGATIVE ng/mL (ref <50)
Temazepam (GC/LC/MS), ur confirm: NEGATIVE ng/mL (ref <50)

## 2014-07-11 LAB — OPIATES/OPIOIDS (LC/MS-MS)
Codeine Urine: NEGATIVE ng/mL (ref <50)
HYDROMORPHONE: 8442 ng/mL — AB (ref <50)
Hydrocodone: NEGATIVE ng/mL (ref <50)
Morphine Urine: >50000 ng/mL — AB (ref <50)
Norhydrocodone, Ur: NEGATIVE ng/mL (ref <50)
Noroxycodone, Ur: NEGATIVE ng/mL (ref <50)
OXYCODONE, UR: NEGATIVE ng/mL (ref <50)
Oxymorphone: NEGATIVE ng/mL (ref <50)

## 2014-07-11 LAB — CANNABANOIDS (GC/LC/MS), URINE: THC-COOH (GC/LC/MS), ur confirm: 514 ng/mL — AB (ref <5)

## 2014-07-12 ENCOUNTER — Encounter (HOSPITAL_COMMUNITY): Payer: Self-pay | Admitting: Emergency Medicine

## 2014-07-12 ENCOUNTER — Emergency Department (HOSPITAL_COMMUNITY)
Admission: EM | Admit: 2014-07-12 | Discharge: 2014-07-12 | Disposition: A | Discharge status: Home / Self Care | Payer: Medicare Other | Attending: Emergency Medicine | Admitting: Emergency Medicine

## 2014-07-12 DIAGNOSIS — Z87891 Personal history of nicotine dependence: Secondary | ICD-10-CM | POA: Insufficient documentation

## 2014-07-12 DIAGNOSIS — D5700 Hb-SS disease with crisis, unspecified: Secondary | ICD-10-CM | POA: Diagnosis not present

## 2014-07-12 DIAGNOSIS — Z79899 Other long term (current) drug therapy: Secondary | ICD-10-CM | POA: Insufficient documentation

## 2014-07-12 DIAGNOSIS — I10 Essential (primary) hypertension: Secondary | ICD-10-CM | POA: Diagnosis not present

## 2014-07-12 DIAGNOSIS — Z86711 Personal history of pulmonary embolism: Secondary | ICD-10-CM | POA: Diagnosis not present

## 2014-07-12 DIAGNOSIS — Z8639 Personal history of other endocrine, nutritional and metabolic disease: Secondary | ICD-10-CM | POA: Insufficient documentation

## 2014-07-12 DIAGNOSIS — Z7901 Long term (current) use of anticoagulants: Secondary | ICD-10-CM | POA: Diagnosis not present

## 2014-07-12 DIAGNOSIS — Z8619 Personal history of other infectious and parasitic diseases: Secondary | ICD-10-CM | POA: Diagnosis not present

## 2014-07-12 DIAGNOSIS — Z8659 Personal history of other mental and behavioral disorders: Secondary | ICD-10-CM | POA: Diagnosis not present

## 2014-07-12 DIAGNOSIS — D649 Anemia, unspecified: Secondary | ICD-10-CM

## 2014-07-12 LAB — CBC WITH DIFFERENTIAL/PLATELET
BASOS ABS: 0.2 10*3/uL — AB (ref 0.0–0.1)
Basophils Relative: 1 % (ref 0–1)
EOS ABS: 0.5 10*3/uL (ref 0.0–0.7)
Eosinophils Relative: 3 % (ref 0–5)
HEMATOCRIT: 17.3 % — AB (ref 39.0–52.0)
HEMOGLOBIN: 5.9 g/dL — AB (ref 13.0–17.0)
Lymphocytes Relative: 16 % (ref 12–46)
Lymphs Abs: 2.6 10*3/uL (ref 0.7–4.0)
MCH: 33 pg (ref 26.0–34.0)
MCHC: 34.1 g/dL (ref 30.0–36.0)
MCV: 96.6 fL (ref 78.0–100.0)
MONO ABS: 1.9 10*3/uL — AB (ref 0.1–1.0)
MONOS PCT: 12 % (ref 3–12)
NEUTROS ABS: 10.9 10*3/uL — AB (ref 1.7–7.7)
Neutrophils Relative %: 68 % (ref 43–77)
PLATELETS: 672 10*3/uL — AB (ref 150–400)
RBC: 1.79 MIL/uL — ABNORMAL LOW (ref 4.22–5.81)
RDW: 18.7 % — ABNORMAL HIGH (ref 11.5–15.5)
WBC: 16.1 10*3/uL — ABNORMAL HIGH (ref 4.0–10.5)

## 2014-07-12 LAB — PRESCRIPTION MONITORING PROFILE (SOLSTAS)
Amphetamine/Meth: NEGATIVE ng/mL
BUPRENORPHINE, URINE: NEGATIVE ng/mL
Barbiturate Screen, Urine: NEGATIVE ng/mL
CARISOPRODOL, URINE: NEGATIVE ng/mL
COCAINE METABOLITES: NEGATIVE ng/mL
CREATININE, URINE: 106.61 mg/dL (ref >20.0)
FENTANYL URINE: NEGATIVE ng/mL
MDMA URINE: NEGATIVE ng/mL
METHADONE SCREEN, URINE: NEGATIVE ng/mL
Meperidine, Ur: NEGATIVE ng/mL
Nitrites, Initial: NEGATIVE ug/mL
OXYCODONE SCRN UR: NEGATIVE ng/mL
Propoxyphene: NEGATIVE ng/mL
Tapentadol, urine: NEGATIVE ng/mL
Tramadol Scrn, Ur: NEGATIVE ng/mL
Zolpidem, Urine: NEGATIVE ng/mL
pH, Initial: 6.2 pH (ref 4.5–8.9)

## 2014-07-12 LAB — COMPREHENSIVE METABOLIC PANEL
ALT: 14 U/L — ABNORMAL LOW (ref 17–63)
ANION GAP: 5 (ref 5–15)
AST: 22 U/L (ref 15–41)
Albumin: 3.8 g/dL (ref 3.5–5.0)
Alkaline Phosphatase: 63 U/L (ref 38–126)
BILIRUBIN TOTAL: 4.1 mg/dL — AB (ref 0.3–1.2)
BUN: 10 mg/dL (ref 6–20)
CO2: 22 mmol/L (ref 22–32)
Calcium: 8.5 mg/dL — ABNORMAL LOW (ref 8.9–10.3)
Chloride: 109 mmol/L (ref 101–111)
Creatinine, Ser: 0.72 mg/dL (ref 0.61–1.24)
GFR calc non Af Amer: >60 mL/min (ref >60)
Glucose, Bld: 122 mg/dL — ABNORMAL HIGH (ref 65–99)
Potassium: 3.6 mmol/L (ref 3.5–5.1)
SODIUM: 136 mmol/L (ref 135–145)
Total Protein: 7.6 g/dL (ref 6.5–8.1)

## 2014-07-12 LAB — URINALYSIS, ROUTINE W REFLEX MICROSCOPIC
BILIRUBIN URINE: NEGATIVE
Glucose, UA: NEGATIVE mg/dL
Hgb urine dipstick: NEGATIVE
Ketones, ur: NEGATIVE mg/dL
Leukocytes, UA: NEGATIVE
Nitrite: NEGATIVE
PH: 6.5 (ref 5.0–8.0)
Protein, ur: NEGATIVE mg/dL
Specific Gravity, Urine: 1.012 (ref 1.005–1.030)
Urobilinogen, UA: 1 mg/dL (ref 0.0–1.0)

## 2014-07-12 LAB — RETICULOCYTES
RBC.: 1.79 MIL/uL — ABNORMAL LOW (ref 4.22–5.81)
Retic Count, Absolute: 189.7 10*3/uL — ABNORMAL HIGH (ref 19.0–186.0)
Retic Ct Pct: 10.6 % — ABNORMAL HIGH (ref 0.4–3.1)

## 2014-07-12 MED ORDER — KETOROLAC TROMETHAMINE 30 MG/ML IJ SOLN
30.0000 mg | Freq: Once | INTRAMUSCULAR | Status: AC
  Administered 2014-07-12: 30 mg via INTRAVENOUS
  Filled 2014-07-12: qty 1

## 2014-07-12 MED ORDER — HEPARIN SOD (PORK) LOCK FLUSH 100 UNIT/ML IV SOLN
500.0000 [IU] | Freq: Once | INTRAVENOUS | Status: AC
  Administered 2014-07-12: 500 [IU]
  Filled 2014-07-12: qty 5

## 2014-07-12 MED ORDER — DIPHENHYDRAMINE HCL 50 MG/ML IJ SOLN
12.5000 mg | Freq: Once | INTRAMUSCULAR | Status: AC
  Administered 2014-07-12: 12.5 mg via INTRAVENOUS
  Filled 2014-07-12: qty 1

## 2014-07-12 MED ORDER — HYDROMORPHONE HCL 2 MG/ML IJ SOLN
2.0000 mg | INTRAMUSCULAR | Status: DC | PRN
  Administered 2014-07-12 (×3): 2 mg via INTRAVENOUS
  Filled 2014-07-12 (×3): qty 1

## 2014-07-12 NOTE — ED Provider Notes (Signed)
CSN: [id number]     Arrival date & time 07/12/14  0157 History   First MD Initiated Contact with Patient 07/12/14 0327     Chief Complaint  Patient presents with  . Sickle Cell Pain Crisis     (Consider location/radiation/quality/duration/timing/severity/associated sxs/prior Treatment) HPI  This is a 34 year old male with history of sickle cell disease with multiple, K Shin's including avascular necrosis and acute chest 2 presents with sickle cell pain. Patient reports left shoulder and leg pain. Patient reports this is consistent with his prior crises. Denies fever, shortness of breath, chest pain. Patient states he's been taking his pain medication at home. He was out of his pain medicine but got it refilled yesterday. He states that it is just not helping. He was seen in clinic on 6/6. At that time he was found to be anemic below his baseline at 6.0. Otherwise his pain was treated and he was discharged. Currently he rates his pain at 10/10.  Past Medical History  Diagnosis Date  . Sickle cell anemia   . Blood transfusion   . Acute embolism and thrombosis of right internal jugular vein   . Hypokalemia   . Mood disorder   . History of pulmonary embolus (PE)   . Avascular necrosis   . Leukocytosis     Chronic  . Thrombocytosis     Chronic  . Hypertension   . History of Clostridium difficile infection   . Uses marijuana   . Chronic anticoagulation   . Functional asplenia   . Former smoker   . Second hand tobacco smoke exposure   . Alcohol consumption of one to four drinks per day   . Noncompliance with medication regimen   . Sickle-cell crisis with associated acute chest syndrome 05/13/2013  . Acute chest syndrome 06/18/2013  . Demand ischemia 01/02/2014   Past Surgical History  Procedure Laterality Date  . Right hip replacement      08/2006  . Cholecystectomy      01/2008  . Porta cath placement    . Porta cath removal    . Umbilical hernia repair      01/2008  .  Excision of left periauricular cyst      10/2009  . Excision of right ear lobe cyst with primary closur      11/2007  . Portacath placement  01/05/2012    Procedure: INSERTION PORT-A-CATH;  Surgeon: Odis Hollingshead, MD;  Location: [facility];  Service: General;  Laterality: N/A;  ultrasound guiced port a cath insertion with fluoroscopy   Family History  Problem Relation Age of Onset  . Sickle cell trait Mother   . Depression Mother   . Diabetes Mother   . Sickle cell trait Father   . Sickle cell trait Brother    History  Substance Use Topics  . Smoking status: Former Smoker -- 13 years    Quit date: 07/08/2010  . Smokeless tobacco: Never Used  . Alcohol Use: No    Review of Systems  Constitutional: Negative.  Negative for fever.  Respiratory: Negative.  Negative for chest tightness and shortness of breath.   Cardiovascular: Negative.  Negative for chest pain.  Gastrointestinal: Negative.  Negative for abdominal pain.  Genitourinary: Negative.   Musculoskeletal: Negative for back pain.       Left shoulder pain, leg pain  Neurological: Negative for headaches.  All other systems reviewed and are negative.     Allergies  Review of patient's allergies indicates no known  allergies.  Home Medications   Prior to Admission medications   Medication Sig Start Date End Date Taking? Authorizing Provider  diltiazem (CARDIZEM CD) 120 MG 24 hr capsule Take 1 capsule (120 mg total) by mouth daily. 03/13/14  Yes Costin Karlyne Greenspan, MD  folic acid (FOLVITE) 1 MG tablet Take 1 tablet (1 mg total) by mouth every morning. 10/08/13  Yes [name], MD  HYDROmorphone (DILAUDID) 4 MG tablet Take 1 tablet (4 mg total) by mouth every 4 (four) hours as needed for severe pain. 07/07/14  Yes Dorena Dew, FNP  hydroxyurea (HYDREA) 500 MG capsule Take 3 capsules (1,500 mg total) by mouth daily. May take with food to minimize GI side effects. 10/30/13  Yes [name], MD  lisinopril  (PRINIVIL,ZESTRIL) 10 MG tablet Take 1 tablet (10 mg total) by mouth daily. 01/22/14  Yes [name], MD  metoprolol tartrate (LOPRESSOR) 25 MG tablet Take 1 tablet (25 mg total) by mouth daily. 08/22/13  Yes [name], MD  morphine (MS CONTIN) 30 MG 12 hr tablet Take 1 tablet (30 mg total) by mouth every 12 (twelve) hours. 07/07/14  Yes Dorena Dew, FNP  potassium chloride SA (K-DUR,KLOR-CON) 20 MEQ tablet Take 1 tablet (20 mEq total) by mouth every morning. 06/09/14  Yes [name], MD  rivaroxaban (XARELTO) 20 MG TABS tablet Take 1 tablet (20 mg total) by mouth every morning. 03/13/14  Yes Costin Karlyne Greenspan, MD  zolpidem (AMBIEN) 10 MG tablet Take 1 tablet (10 mg total) by mouth at bedtime as needed for sleep. 07/09/14  Yes Dorena Dew, FNP  Cholecalciferol (VITAMIN D) 2000 UNITS tablet Take 1 tablet (2,000 Units total) by mouth daily. Patient not taking: Reported on 07/07/2014 02/13/14   [name], MD  metroNIDAZOLE (FLAGYL) 500 MG tablet Take 1 tablet (500 mg total) by mouth every 8 (eight) hours. Patient not taking: Reported on 07/07/2014 07/01/14   [name], MD   BP 126/70 mmHg  Pulse 96  Temp(Src) 98.5 F (36.9 C)  Resp 18  SpO2 94% Physical Exam  Constitutional: He is oriented to person, place, and time. No distress.  Chronically ill-appearing, no acute distress  HENT:  Head: Normocephalic and atraumatic.  Cardiovascular: Normal rate, regular rhythm and normal heart sounds.   No murmur heard. Pulmonary/Chest: Effort normal and breath sounds normal. No respiratory distress. He has no wheezes.  Abdominal: Soft. Bowel sounds are normal. There is no tenderness. There is no rebound and no guarding.  Musculoskeletal: He exhibits no edema.  Neurological: He is alert and oriented to person, place, and time.  Skin: Skin is warm and dry.  Psychiatric: He has a normal mood and affect.  Nursing note and vitals reviewed.   ED Course   Procedures (including critical care time) Labs Review Labs Reviewed  RETICULOCYTES - Abnormal; Notable for the following:    Retic Ct Pct 10.6 (*)    RBC. 1.79 (*)    Retic Count, Manual 189.7 (*)    All other components within normal limits  COMPREHENSIVE METABOLIC PANEL - Abnormal; Notable for the following:    Glucose, Bld 122 (*)    Calcium 8.5 (*)    ALT 14 (*)    Total Bilirubin 4.1 (*)    All other components within normal limits  URINALYSIS, ROUTINE W REFLEX MICROSCOPIC (NOT AT [facility]) - Abnormal; Notable for the following:    Color, Urine AMBER (*)    All  other components within normal limits  CBC WITH DIFFERENTIAL/PLATELET - Abnormal; Notable for the following:    WBC 16.1 (*)    RBC 1.79 (*)    Hemoglobin 5.9 (*)    HCT 17.3 (*)    RDW 18.7 (*)    Platelets 672 (*)    Neutro Abs 10.9 (*)    Monocytes Absolute 1.9 (*)    Basophils Absolute 0.2 (*)    All other components within normal limits    Imaging Review No results found.   EKG Interpretation None      MDM   Final diagnoses:  Sickle cell crisis  Anemia, unspecified anemia type   Patient presents with pain consistent with prior sickle cell crises. Nontoxic on exam.  Afebrile. No chest pain. Nontoxic. Lab work obtained. Notable for hemoglobin of 5.9. Last hemoglobin was 6.0 with a baseline around 7.0. After Toradol and 3 doses of Dilaudid, he reports improvement of pain. Discussed with patient calling the sickle cell clinic and Dr. Zigmund Daniel on Monday to be scheduled for a recheck of his CBC. He may need transfusion ultimately; however, I do not feel this needs to be done on emergent basis as patient is improved.  After history, exam, and medical workup I feel the patient has been appropriately medically screened and is safe for discharge home. Pertinent diagnoses were discussed with the patient. Patient was given return precautions.   Merryl Hacker, MD 07/12/14 [phone]

## 2014-07-12 NOTE — Discharge Instructions (Signed)
You were seen today for sickle cell crisis. You continue to be anemic slightly below your baseline at 5.9. Last check was 6.0. Her baseline is 7.0. You need to follow-up with Dr. Zigmund Daniel in 2 days for recheck in clinic. You may need transfusion. Given that you are feeling better, he will be discharged home.  Sickle Cell Anemia, Adult Sickle cell anemia is a condition in which red blood cells have an abnormal "sickle" shape. This abnormal shape shortens the cells' life span, which results in a lower than normal concentration of red blood cells in the blood. The sickle shape also causes the cells to clump together and block free blood flow through the blood vessels. As a result, the tissues and organs of the body do not receive enough oxygen. Sickle cell anemia causes organ damage and pain and increases the risk of infection. CAUSES  Sickle cell anemia is a genetic disorder. Those who receive two copies of the gene have the condition, and those who receive one copy have the trait. RISK FACTORS The sickle cell gene is most common in people whose families originated in [place]. Other areas of the globe where sickle cell trait occurs include the [place], [place] and [place], and the [place].  SIGNS AND SYMPTOMS  Pain, especially in the extremities, back, chest, or abdomen (common). The pain may start suddenly or may develop following an illness, especially if there is dehydration. Pain can also occur due to overexertion or exposure to extreme temperature changes.  Frequent severe bacterial infections, especially certain types of pneumonia and meningitis.  Pain and swelling in the hands and feet.  Decreased activity.   Loss of appetite.   Change in behavior.  Headaches.  Seizures.  Shortness of breath or difficulty breathing.  Vision changes.  Skin ulcers. Those with the trait may not have symptoms or they may have mild symptoms.  DIAGNOSIS  Sickle cell  anemia is diagnosed with blood tests that demonstrate the genetic trait. It is often diagnosed during the newborn period, due to mandatory testing nationwide. A variety of blood tests, X-rays, CT scans, MRI scans, ultrasounds, and lung function tests may also be done to monitor the condition. TREATMENT  Sickle cell anemia may be treated with:  Medicines. You may be given pain medicines, antibiotic medicines (to treat and prevent infections) or medicines to increase the production of certain types of hemoglobin.  Fluids.  Oxygen.  Blood transfusions. HOME CARE INSTRUCTIONS   Drink enough fluid to keep your urine clear or pale yellow. Increase your fluid intake in hot weather and during exercise.  Do not smoke. Smoking lowers oxygen levels in the blood.   Only take over-the-counter or prescription medicines for pain, fever, or discomfort as directed by your health care provider.  Take antibiotics as directed by your health care provider. Make sure you finish them it even if you start to feel better.   Take supplements as directed by your health care provider.   Consider wearing a medical alert bracelet. This tells anyone caring for you in an emergency of your condition.   When traveling, keep your medical information, health care provider's names, and the medicines you take with you at all times.   If you develop a fever, do not take medicines to reduce the fever right away. This could cover up a problem that is developing. Notify your health care provider.  Keep all follow-up appointments with your health care provider. Sickle cell anemia requires regular medical  care. SEEK MEDICAL CARE IF: You have a fever. SEEK IMMEDIATE MEDICAL CARE IF:   You feel dizzy or faint.   You have new abdominal pain, especially on the left side near the stomach area.   You develop a persistent, often uncomfortable and painful penile erection (priapism). If this is not treated immediately it  will lead to impotence.   You have numbness your arms or legs or you have a hard time moving them.   You have a hard time with speech.   You have a fever or persistent symptoms for more than 2-3 days.   You have a fever and your symptoms suddenly get worse.   You have signs or symptoms of infection. These include:   Chills.   Abnormal tiredness (lethargy).   Irritability.   Poor eating.   Vomiting.   You develop pain that is not helped with medicine.   You develop shortness of breath.  You have pain in your chest.   You are coughing up pus-like or bloody sputum.   You develop a stiff neck.  Your feet or hands swell or have pain.  Your abdomen appears bloated.  You develop joint pain. MAKE SURE YOU:  Understand these instructions. Document Released: 04/27/2005 Document Revised: 06/03/2013 Document Reviewed: 08/29/2012 [facility] Patient Information 2015 [place], [place]. This information is not intended to replace advice given to you by your health care provider. Make sure you discuss any questions you have with your health care provider.

## 2014-07-12 NOTE — ED Notes (Signed)
Pt c/o L shoulder, rib and leg pain onset last evening. Pt states pain similar to SS crisis in the past. Pt has taken MS contin without relief.

## 2014-07-14 ENCOUNTER — Ambulatory Visit (HOSPITAL_COMMUNITY)
Admission: EM | Admit: 2014-07-14 | Discharge: 2014-07-14 | Disposition: A | Discharge status: Home / Self Care | Payer: Medicare Other | Attending: Internal Medicine | Admitting: Internal Medicine

## 2014-07-14 ENCOUNTER — Encounter (HOSPITAL_COMMUNITY): Payer: Self-pay

## 2014-07-14 ENCOUNTER — Emergency Department (HOSPITAL_COMMUNITY): Payer: Medicare Other

## 2014-07-14 DIAGNOSIS — Z7901 Long term (current) use of anticoagulants: Secondary | ICD-10-CM | POA: Diagnosis not present

## 2014-07-14 DIAGNOSIS — Z87891 Personal history of nicotine dependence: Secondary | ICD-10-CM | POA: Diagnosis not present

## 2014-07-14 DIAGNOSIS — Z79899 Other long term (current) drug therapy: Secondary | ICD-10-CM | POA: Diagnosis not present

## 2014-07-14 DIAGNOSIS — D473 Essential (hemorrhagic) thrombocythemia: Secondary | ICD-10-CM | POA: Diagnosis not present

## 2014-07-14 DIAGNOSIS — E876 Hypokalemia: Secondary | ICD-10-CM | POA: Insufficient documentation

## 2014-07-14 DIAGNOSIS — I10 Essential (primary) hypertension: Secondary | ICD-10-CM | POA: Diagnosis not present

## 2014-07-14 DIAGNOSIS — R079 Chest pain, unspecified: Secondary | ICD-10-CM

## 2014-07-14 DIAGNOSIS — Z96641 Presence of right artificial hip joint: Secondary | ICD-10-CM | POA: Diagnosis not present

## 2014-07-14 DIAGNOSIS — Z9114 Patient's other noncompliance with medication regimen: Secondary | ICD-10-CM | POA: Insufficient documentation

## 2014-07-14 DIAGNOSIS — R0902 Hypoxemia: Secondary | ICD-10-CM | POA: Insufficient documentation

## 2014-07-14 DIAGNOSIS — I517 Cardiomegaly: Secondary | ICD-10-CM | POA: Diagnosis not present

## 2014-07-14 DIAGNOSIS — Z86711 Personal history of pulmonary embolism: Secondary | ICD-10-CM | POA: Diagnosis not present

## 2014-07-14 DIAGNOSIS — D5700 Hb-SS disease with crisis, unspecified: Secondary | ICD-10-CM | POA: Diagnosis not present

## 2014-07-14 LAB — CBC WITH DIFFERENTIAL/PLATELET
BASOS PCT: 1 % (ref 0–1)
Basophils Absolute: 0.3 10*3/uL — ABNORMAL HIGH (ref 0.0–0.1)
Basophils Absolute: 0.2 10*3/uL — ABNORMAL HIGH (ref 0.0–0.1)
Basophils Relative: 2 % — ABNORMAL HIGH (ref 0–1)
EOS ABS: 0.7 10*3/uL (ref 0.0–0.7)
Eosinophils Absolute: 0.5 10*3/uL (ref 0.0–0.7)
Eosinophils Relative: 4 % (ref 0–5)
Eosinophils Relative: 3 % (ref 0–5)
HCT: 18.9 % — ABNORMAL LOW (ref 39.0–52.0)
HEMATOCRIT: 17.5 % — AB (ref 39.0–52.0)
HEMOGLOBIN: 6.3 g/dL — AB (ref 13.0–17.0)
Hemoglobin: 5.8 g/dL — CL (ref 13.0–17.0)
Lymphocytes Relative: 24 % (ref 12–46)
Lymphocytes Relative: 25 % (ref 12–46)
Lymphs Abs: 4 10*3/uL (ref 0.7–4.0)
Lymphs Abs: 4.5 10*3/uL — ABNORMAL HIGH (ref 0.7–4.0)
MCH: 32.8 pg (ref 26.0–34.0)
MCH: 33.2 pg (ref 26.0–34.0)
MCHC: 33.1 g/dL (ref 30.0–36.0)
MCHC: 33.3 g/dL (ref 30.0–36.0)
MCV: 98.9 fL (ref 78.0–100.0)
MCV: 99.5 fL (ref 78.0–100.0)
MONO ABS: 2.3 10*3/uL — AB (ref 0.1–1.0)
MONOS PCT: 14 % — AB (ref 3–12)
MONOS PCT: 13 % — AB (ref 3–12)
Monocytes Absolute: 2.4 10*3/uL — ABNORMAL HIGH (ref 0.1–1.0)
NEUTROS ABS: 9.4 10*3/uL — AB (ref 1.7–7.7)
NEUTROS PCT: 58 % (ref 43–77)
NRBC: 1 /100{WBCs} — AB
Neutro Abs: 10.4 10*3/uL — ABNORMAL HIGH (ref 1.7–7.7)
Neutrophils Relative %: 56 % (ref 43–77)
PLATELETS: 662 10*3/uL — AB (ref 150–400)
Platelets: 659 10*3/uL — ABNORMAL HIGH (ref 150–400)
RBC: 1.77 MIL/uL — ABNORMAL LOW (ref 4.22–5.81)
RBC: 1.9 MIL/uL — ABNORMAL LOW (ref 4.22–5.81)
RDW: 21 % — AB (ref 11.5–15.5)
RDW: 20.8 % — ABNORMAL HIGH (ref 11.5–15.5)
WBC: 16.8 10*3/uL — AB (ref 4.0–10.5)
WBC: 17.9 10*3/uL — ABNORMAL HIGH (ref 4.0–10.5)

## 2014-07-14 LAB — COMPREHENSIVE METABOLIC PANEL
ALT: 15 U/L — ABNORMAL LOW (ref 17–63)
AST: 30 U/L (ref 15–41)
Albumin: 3.8 g/dL (ref 3.5–5.0)
Alkaline Phosphatase: 71 U/L (ref 38–126)
Anion gap: 7 (ref 5–15)
BUN: 6 mg/dL (ref 6–20)
CALCIUM: 8.7 mg/dL — AB (ref 8.9–10.3)
CHLORIDE: 109 mmol/L (ref 101–111)
CO2: 22 mmol/L (ref 22–32)
CREATININE: 0.64 mg/dL (ref 0.61–1.24)
GFR calc Af Amer: >60 mL/min (ref >60)
GFR calc non Af Amer: >60 mL/min (ref >60)
Glucose, Bld: 104 mg/dL — ABNORMAL HIGH (ref 65–99)
Potassium: 3.6 mmol/L (ref 3.5–5.1)
Sodium: 138 mmol/L (ref 135–145)
TOTAL PROTEIN: 7.4 g/dL (ref 6.5–8.1)
Total Bilirubin: 5.6 mg/dL — ABNORMAL HIGH (ref 0.3–1.2)

## 2014-07-14 LAB — LACTATE DEHYDROGENASE: LDH: 436 U/L — ABNORMAL HIGH (ref 98–192)

## 2014-07-14 LAB — RETICULOCYTES
RBC.: 1.77 MIL/uL — ABNORMAL LOW (ref 4.22–5.81)
Retic Count, Absolute: 338.1 10*3/uL — ABNORMAL HIGH (ref 19.0–186.0)
Retic Ct Pct: 19.1 % — ABNORMAL HIGH (ref 0.4–3.1)

## 2014-07-14 IMAGING — CR DG CHEST 2V
2 series · 2 of 2 positions shown · non-contrast
Comparison: 08/17/2011

CLINICAL DATA: Sickle cell crisis, shortness of breath, left chest
pain

CHEST - 2 VIEW

[w chest pa]
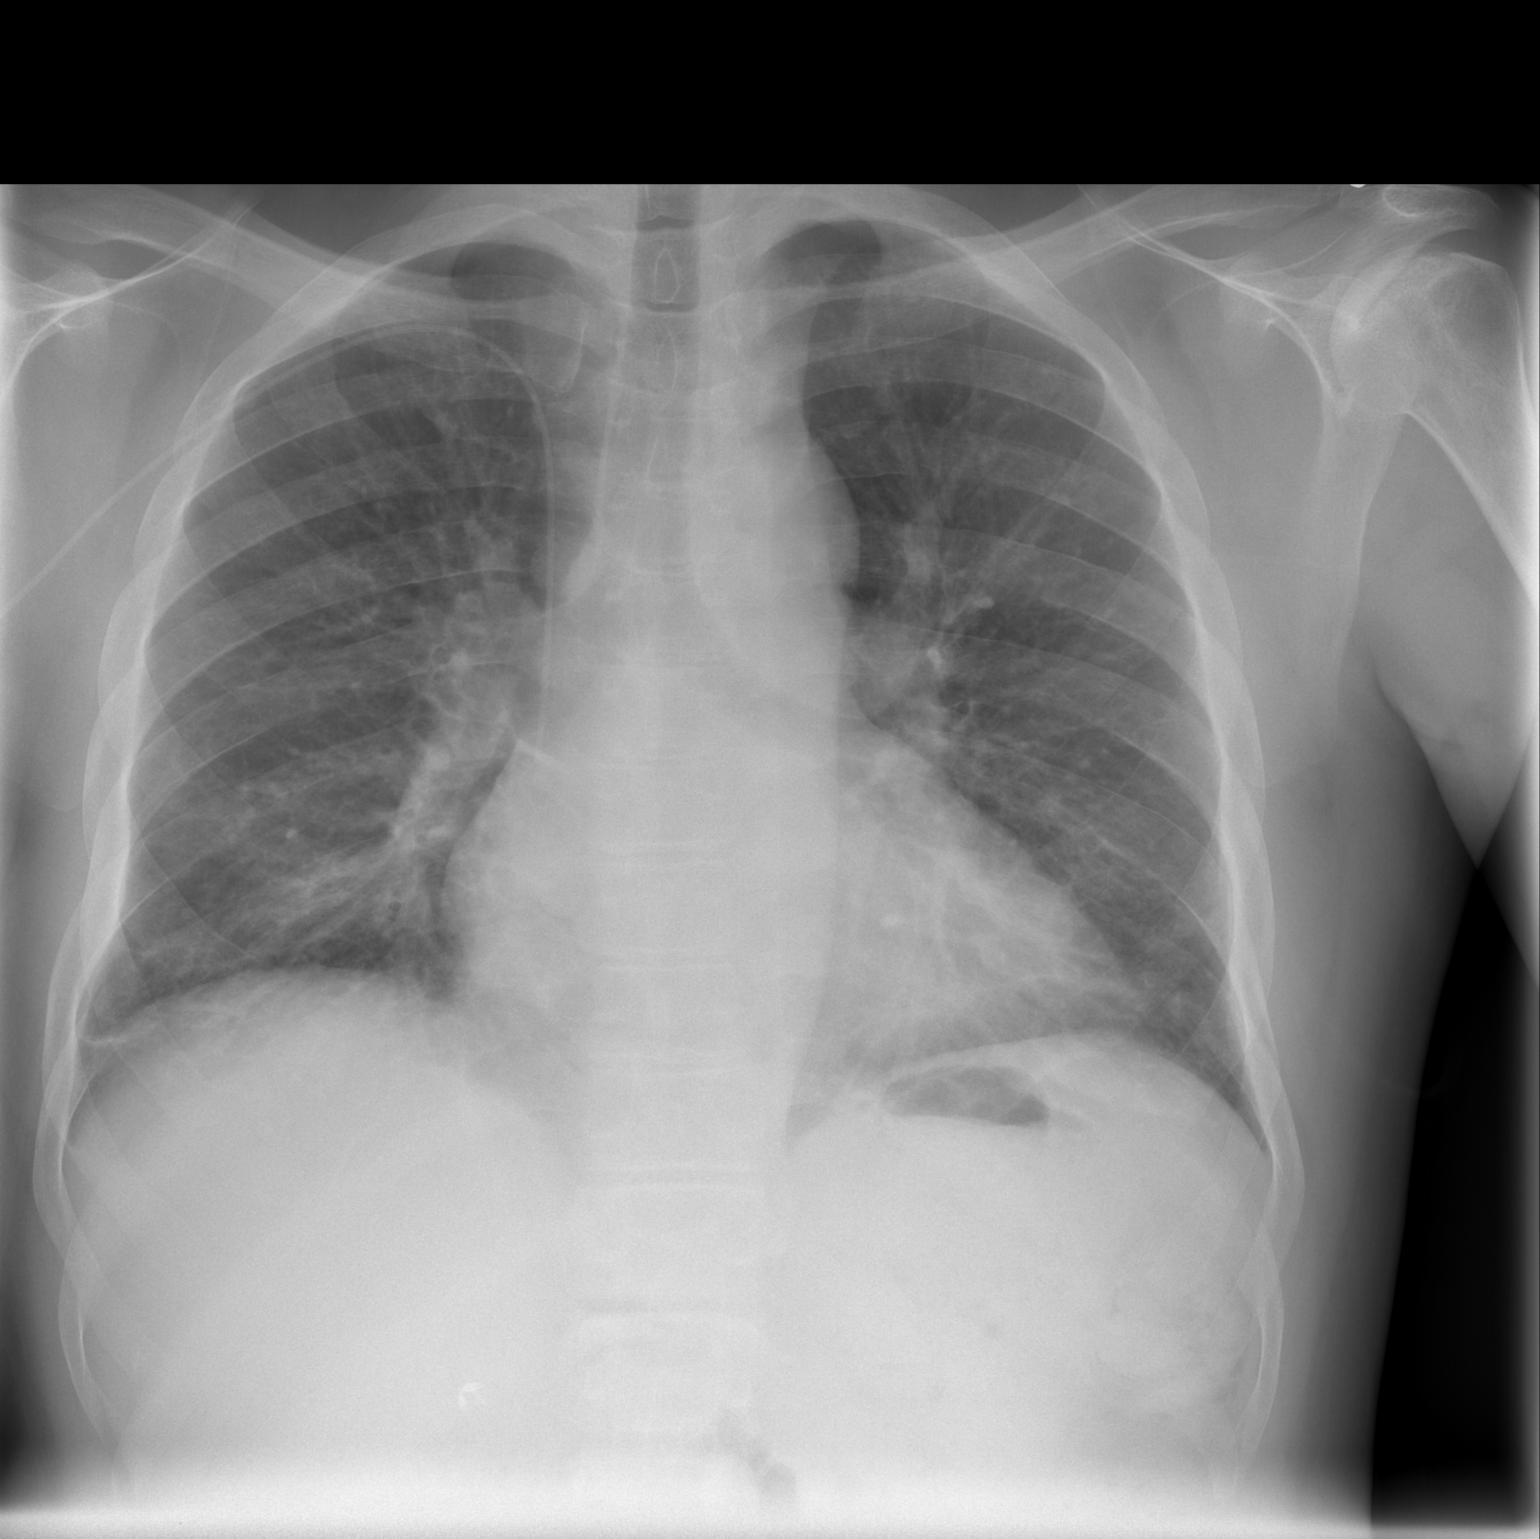

[w chest lat]
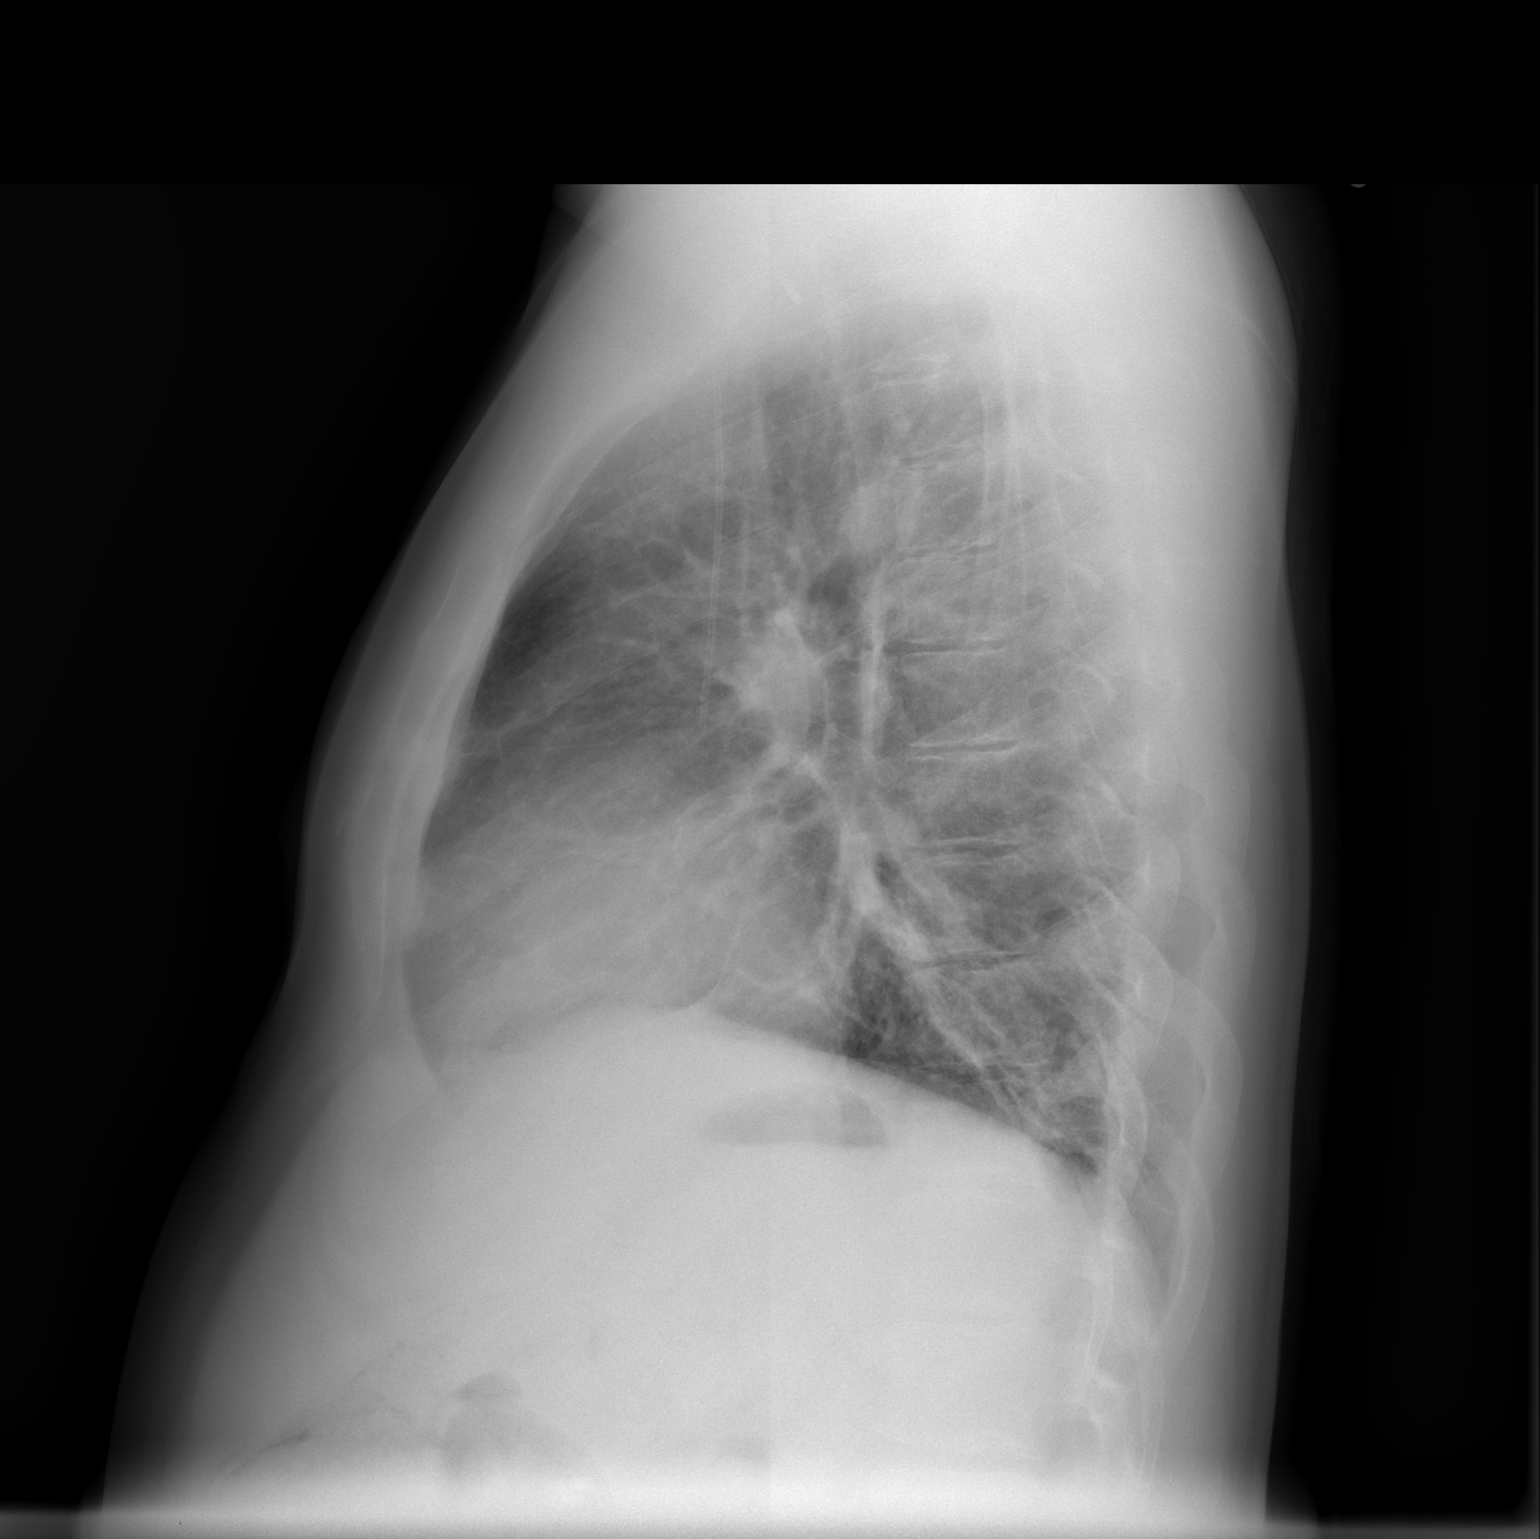

[2 of 2 positions shown; findings below may reference images not displayed]

FINDINGS: Right PICC line tip SVC RA junction.  Stable mild
cardiomegaly with vascular prominence.  Slight increased basilar
streaky airspace disease/atelectasis.  No consolidation, edema,
effusion or pneumothorax.  Trachea midline.  Prior cholecystectomy
evident.
IMPRESSION: Slight increased basilar atelectasis / streaky airspace disease.

## 2014-07-14 MED ORDER — POLYETHYLENE GLYCOL 3350 17 G PO PACK: 17.0000 g | PACK | Freq: Every day | ORAL | Status: DC | PRN

## 2014-07-14 MED ORDER — HYDROMORPHONE HCL 1 MG/ML IJ SOLN
1.0000 mg | Freq: Once | INTRAMUSCULAR | Status: AC
  Administered 2014-07-14: 1 mg via INTRAVENOUS
  Filled 2014-07-14: qty 1

## 2014-07-14 MED ORDER — HEPARIN SOD (PORK) LOCK FLUSH 100 UNIT/ML IV SOLN
500.0000 [IU] | INTRAVENOUS | Status: AC | PRN
  Administered 2014-07-14: 500 [IU]
  Filled 2014-07-14: qty 5

## 2014-07-14 MED ORDER — SODIUM CHLORIDE 0.9 % IJ SOLN
10.0000 mL | INTRAMUSCULAR | Status: AC | PRN
  Administered 2014-07-14: 10 mL

## 2014-07-14 MED ORDER — SODIUM CHLORIDE 0.9 % IV BOLUS (SEPSIS)
1000.0000 mL | Freq: Once | INTRAVENOUS | Status: AC
  Administered 2014-07-14: 1000 mL via INTRAVENOUS

## 2014-07-14 MED ORDER — HYDROMORPHONE 2 MG/ML HIGH CONCENTRATION IV PCA SOLN
INTRAVENOUS | Status: DC
  Administered 2014-07-14: 4 mg via INTRAVENOUS
  Administered 2014-07-14: 10 mg via INTRAVENOUS
  Administered 2014-07-14: 11:00:00 via INTRAVENOUS
  Filled 2014-07-14 (×2): qty 25

## 2014-07-14 MED ORDER — DIPHENHYDRAMINE HCL 50 MG/ML IJ SOLN
12.5000 mg | Freq: Four times a day (QID) | INTRAMUSCULAR | Status: DC | PRN
  Filled 2014-07-14: qty 0.25

## 2014-07-14 MED ORDER — DIPHENHYDRAMINE HCL 12.5 MG/5ML PO ELIX
12.5000 mg | ORAL_SOLUTION | Freq: Four times a day (QID) | ORAL | Status: DC | PRN
  Administered 2014-07-14: 12.5 mg via ORAL
  Filled 2014-07-14: qty 5

## 2014-07-14 MED ORDER — HYDROMORPHONE HCL 4 MG PO TABS
4.0000 mg | ORAL_TABLET | ORAL | Status: DC | PRN
  Administered 2014-07-14: 4 mg via ORAL
  Filled 2014-07-14: qty 1

## 2014-07-14 MED ORDER — SODIUM CHLORIDE 0.9 % IJ SOLN: 9.0000 mL | INTRAMUSCULAR | Status: DC | PRN

## 2014-07-14 MED ORDER — DEXTROSE-NACL 5-0.45 % IV SOLN
INTRAVENOUS | Status: DC
  Administered 2014-07-14: 11:00:00 via INTRAVENOUS

## 2014-07-14 MED ORDER — KETOROLAC TROMETHAMINE 30 MG/ML IJ SOLN
30.0000 mg | Freq: Four times a day (QID) | INTRAMUSCULAR | Status: DC
  Administered 2014-07-14: 30 mg via INTRAVENOUS
  Filled 2014-07-14: qty 1

## 2014-07-14 MED ORDER — HYDROMORPHONE HCL 2 MG/ML IJ SOLN
2.0000 mg | Freq: Once | INTRAMUSCULAR | Status: AC
  Administered 2014-07-14: 2 mg via INTRAVENOUS
  Filled 2014-07-14: qty 1

## 2014-07-14 MED ORDER — SENNOSIDES-DOCUSATE SODIUM 8.6-50 MG PO TABS: 1.0000 | ORAL_TABLET | Freq: Two times a day (BID) | ORAL | Status: DC

## 2014-07-14 MED ORDER — ONDANSETRON HCL 4 MG/2ML IJ SOLN: 4.0000 mg | Freq: Four times a day (QID) | INTRAMUSCULAR | Status: DC | PRN

## 2014-07-14 MED ORDER — MORPHINE SULFATE ER 30 MG PO TBCR
30.0000 mg | EXTENDED_RELEASE_TABLET | Freq: Two times a day (BID) | ORAL | Status: DC
  Administered 2014-07-14: 30 mg via ORAL
  Filled 2014-07-14: qty 1

## 2014-07-14 MED ORDER — NALOXONE HCL 0.4 MG/ML IJ SOLN: 0.4000 mg | INTRAMUSCULAR | Status: DC | PRN

## 2014-07-14 MED ORDER — FOLIC ACID 1 MG PO TABS
1.0000 mg | ORAL_TABLET | Freq: Every day | ORAL | Status: DC
  Administered 2014-07-14: 1 mg via ORAL
  Filled 2014-07-14: qty 1

## 2014-07-14 NOTE — ED Notes (Addendum)
Awake. Verbally responsive. A/O x4. Resp even and unlabored. No audible adventitious breath sounds noted. Auscultated clear breath sounds bil and throughout. Wearing O2 at 2lpm via [place]. ABC's intact. IV continues to infuse NS at 184ml/hr without difficulty.

## 2014-07-14 NOTE — Discharge Summary (Signed)
Physician Discharge Summary  Patient ID: [name] MRN: [id number] DOB/AGE: [birth year] 35 y.o.  Admit date: 07/14/2014 Discharge date: 07/14/2014  Admission Diagnoses:  Discharge Diagnoses:  Active Problems:   Sickle cell pain crisis   Sickle cell disease with crisis   Discharged Condition: poor  Hospital Course: Patient was admitted to the day hospital with sickle cell painful crisis as well as hypoxemia. His pain was controlled using IV Dilaudid PCA with Toradol and IV fluids. His oxygen sats remained in the 80s on room air. We'll discuss with patient that it is not safe for him to go home especially with his history of pulmonary embolism as well as recent acute chest syndrome. We'll offer to admit the patient to the main hospital for continued care but he opted to leave [facility].  Consults: None  Significant Diagnostic Studies: labs: CBCs and CMP has been checked and this morning in the ER  Treatments: IV hydration and analgesia: Dilaudid  Discharge Exam: Blood pressure 110/66, pulse 87, temperature 98.6 F (37 C), temperature source Oral, resp. rate 22, SpO2 87 %. General appearance: alert, cooperative and no distress Head: Normocephalic, without obvious abnormality, atraumatic Eyes: conjunctivae/corneas clear. PERRL, EOM's intact. Fundi benign. Neck: no adenopathy, no carotid bruit, no JVD, supple, symmetrical, trachea midline and thyroid not enlarged, symmetric, no tenderness/mass/nodules Back: symmetric, no curvature. ROM normal. No CVA tenderness. Resp: clear to auscultation bilaterally Chest wall: no tenderness Cardio: regular rate and rhythm, S1, S2 normal, no murmur, click, rub or gallop GI: soft, non-tender; bowel sounds normal; no masses,  no organomegaly Extremities: extremities normal, atraumatic, no cyanosis or edema Pulses: 2+ and symmetric Skin: Skin color, texture, turgor normal. No rashes or lesions Neurologic: Grossly  normal  Disposition: 01-Home or Self Care     Medication List    ASK your doctor about these medications        diltiazem 120 MG 24 hr capsule  Commonly known as:  CARDIZEM CD  Take 1 capsule (120 mg total) by mouth daily.     folic acid 1 MG tablet  Commonly known as:  FOLVITE  Take 1 tablet (1 mg total) by mouth every morning.     HYDROmorphone 4 MG tablet  Commonly known as:  DILAUDID  Take 1 tablet (4 mg total) by mouth every 4 (four) hours as needed for severe pain.     hydroxyurea 500 MG capsule  Commonly known as:  HYDREA  Take 3 capsules (1,500 mg total) by mouth daily. May take with food to minimize GI side effects.     lisinopril 10 MG tablet  Commonly known as:  PRINIVIL,ZESTRIL  Take 1 tablet (10 mg total) by mouth daily.     metoprolol tartrate 25 MG tablet  Commonly known as:  LOPRESSOR  Take 1 tablet (25 mg total) by mouth daily.     metroNIDAZOLE 500 MG tablet  Commonly known as:  FLAGYL  Take 1 tablet (500 mg total) by mouth every 8 (eight) hours.     morphine 30 MG 12 hr tablet  Commonly known as:  MS CONTIN  Take 1 tablet (30 mg total) by mouth every 12 (twelve) hours.     potassium chloride SA 20 MEQ tablet  Commonly known as:  K-DUR,KLOR-CON  Take 1 tablet (20 mEq total) by mouth every morning.     rivaroxaban 20 MG Tabs tablet  Commonly known as:  XARELTO  Take 1 tablet (20 mg total) by mouth every morning.  Vitamin D 2000 UNITS tablet  Take 1 tablet (2,000 Units total) by mouth daily.     zolpidem 10 MG tablet  Commonly known as:  AMBIEN  Take 1 tablet (10 mg total) by mouth at bedtime as needed for sleep.         SignedBarbette Merino 07/14/2014, 5:27 PM

## 2014-07-14 NOTE — Progress Notes (Signed)
Pt arrived from ED by wheelchair; vital signs taken, Oxygen level 85-87% on RA; MD notified; pt placed on 2L [place]; will continue to monitor

## 2014-07-14 NOTE — ED Notes (Signed)
BP of 126/33 was validated in error.  Pt's manual BP 125/70

## 2014-07-14 NOTE — ED Notes (Signed)
Pt given a drink with ice.

## 2014-07-14 NOTE — Progress Notes (Signed)
Patient was seen and examined, when this MD informed patient that he was being admitted-he requested that he be transferred to the [facility] to see if could avoid an admission. This MD/EDP Dr Kathrynn Humble explained to patient that he probably will benefit for being admitted directly to the hospital, given that this is his 2nd ED visit in the past few days and that his Hb is slightly below his usual baseline. However patient wanted to see if he could be managed at the day hospital and if his symptoms were not well controlled then he would come back to the ED. I have spoken with Dr Jonelle Sidle (Sickle Cell MD) and Dr Kathrynn Humble. Hospitalist service will sign off.

## 2014-07-14 NOTE — ED Notes (Signed)
Awake. Verbally responsive. A/O x4. Resp even and unlabored. No audible adventitious breath sounds noted. ABC's intact. IV continues to infuse NS at 15ml/hr.

## 2014-07-14 NOTE — H&P (Signed)
[name] is an 34 y.o. male.   Chief Complaint: Pains in leg him back for 2 days HPI: A 34 year old gentleman with known history of sickle cell disease who has been to the ER list 2 days this past 3 days with sickle cell painful crisis. He was admitted to into the hospital today but declined and wanted to come to the day hospital for pain control. He still having pain at 8 out of 10 in her legs and back. He denied any nausea vomiting or diarrhea. Denied any chest pain or shortness of breath. Here the day hospital his oxygen saturation has been 85-88% on room air. Patient is still reluctant as to admission into the main hospital program. When evaluating him for pain management and possible inpatient admission.  Past Medical History  Diagnosis Date  . Sickle cell anemia   . Blood transfusion   . Acute embolism and thrombosis of right internal jugular vein   . Hypokalemia   . Mood disorder   . History of pulmonary embolus (PE)   . Avascular necrosis   . Leukocytosis     Chronic  . Thrombocytosis     Chronic  . Hypertension   . History of Clostridium difficile infection   . Uses marijuana   . Chronic anticoagulation   . Functional asplenia   . Former smoker   . Second hand tobacco smoke exposure   . Alcohol consumption of one to four drinks per day   . Noncompliance with medication regimen   . Sickle-cell crisis with associated acute chest syndrome 05/13/2013  . Acute chest syndrome 06/18/2013  . Demand ischemia 01/02/2014    Past Surgical History  Procedure Laterality Date  . Right hip replacement      08/2006  . Cholecystectomy      01/2008  . Porta cath placement    . Porta cath removal    . Umbilical hernia repair      01/2008  . Excision of left periauricular cyst      10/2009  . Excision of right ear lobe cyst with primary closur      11/2007  . Portacath placement  01/05/2012    Procedure: INSERTION PORT-A-CATH;  Surgeon: Odis Hollingshead, MD;  Location: [facility];   Service: General;  Laterality: N/A;  ultrasound guiced port a cath insertion with fluoroscopy    Family History  Problem Relation Age of Onset  . Sickle cell trait Mother   . Depression Mother   . Diabetes Mother   . Sickle cell trait Father   . Sickle cell trait Brother    Social History:  reports that he quit smoking about 4 years ago. He has never used smokeless tobacco. He reports that he uses illicit drugs (Marijuana) about twice per week. He reports that he does not drink alcohol.  Allergies: No Known Allergies   (Not in a hospital admission)  Results for orders placed or performed during the hospital encounter of 07/14/14 (from the past 48 hour(s))  CBC WITH DIFFERENTIAL     Status: Abnormal   Collection Time: 07/14/14  5:20 AM  Result Value Ref Range   WBC 16.8 (H) 4.0 - 10.5 K/uL   RBC 1.77 (L) 4.22 - 5.81 MIL/uL   Hemoglobin 5.8 (LL) 13.0 - 17.0 g/dL    Comment: REPEATED TO VERIFY CRITICAL RESULT CALLED TO, READ BACK BY AND VERIFIED WITH: J OXENDINE AT 0535 ON 06.13.16 BY G KONTOS    HCT 17.5 (L) 39.0 -  52.0 %   MCV 98.9 78.0 - 100.0 fL   MCH 32.8 26.0 - 34.0 pg   MCHC 33.1 30.0 - 36.0 g/dL   RDW 21.0 (H) 11.5 - 15.5 %   Platelets 659 (H) 150 - 400 K/uL   Neutrophils Relative % 56 43 - 77 %   Lymphocytes Relative 24 12 - 46 %   Monocytes Relative 14 (H) 3 - 12 %   Eosinophils Relative 4 0 - 5 %   Basophils Relative 2 (H) 0 - 1 %   nRBC 1 (H) 0 /100 WBC   Neutro Abs 9.4 (H) 1.7 - 7.7 K/uL   Lymphs Abs 4.0 0.7 - 4.0 K/uL   Monocytes Absolute 2.4 (H) 0.1 - 1.0 K/uL   Eosinophils Absolute 0.7 0.0 - 0.7 K/uL   Basophils Absolute 0.3 (H) 0.0 - 0.1 K/uL   RBC Morphology POLYCHROMASIA PRESENT     Comment: TARGET CELLS HOWELL/JOLLY BODIES SICKLE CELLS RARE NRBCs   Comprehensive metabolic panel     Status: Abnormal   Collection Time: 07/14/14  5:20 AM  Result Value Ref Range   Sodium 138 135 - 145 mmol/L   Potassium 3.6 3.5 - 5.1 mmol/L   Chloride 109 101 -  111 mmol/L   CO2 22 22 - 32 mmol/L   Glucose, Bld 104 (H) 65 - 99 mg/dL   BUN 6 6 - 20 mg/dL   Creatinine, Ser 0.64 0.61 - 1.24 mg/dL   Calcium 8.7 (L) 8.9 - 10.3 mg/dL   Total Protein 7.4 6.5 - 8.1 g/dL   Albumin 3.8 3.5 - 5.0 g/dL   AST 30 15 - 41 U/L   ALT 15 (L) 17 - 63 U/L   Alkaline Phosphatase 71 38 - 126 U/L   Total Bilirubin 5.6 (H) 0.3 - 1.2 mg/dL   GFR calc non Af Amer >60 >60 mL/min   GFR calc Af Amer >60 >60 mL/min    Comment: (NOTE) The eGFR has been calculated using the CKD EPI equation. This calculation has not been validated in all clinical situations. eGFR's persistently <60 mL/min signify possible Chronic Kidney Disease.    Anion gap 7 5 - 15  Reticulocytes     Status: Abnormal   Collection Time: 07/14/14  5:20 AM  Result Value Ref Range   Retic Ct Pct 19.1 (H) 0.4 - 3.1 %   RBC. 1.77 (L) 4.22 - 5.81 MIL/uL   Retic Count, Manual 338.1 (H) 19.0 - 186.0 K/uL  Type and screen for Sickle Cell Protocol     Status: None (Preliminary result)   Collection Time: 07/14/14  6:45 AM  Result Value Ref Range   ABO/RH(D) O POS    Antibody Screen NEG    Sample Expiration 07/17/2014    Unit Number [id number]    Blood Component Type RED CELLS,LR    Unit division 00    Status of Unit ALLOCATED    Donor AG Type      NEGATIVE FOR E ANTIGEN NEGATIVE FOR C ANTIGEN NEGATIVE FOR KELL ANTIGEN   Transfusion Status OK TO TRANSFUSE    Crossmatch Result COMPATIBLE    *Note: Due to a large number of results and/or encounters for the requested time period, some results have not been displayed. A complete set of results can be found in Results Review.   Dg Chest 2 View  07/14/2014   CLINICAL DATA:  Sickle cell pain crisis. Mid to left upper back pain. Weakness. Started several hours ago.  EXAM: CHEST  2 VIEW  COMPARISON:  06/25/2014  FINDINGS: Cardiac enlargement with patchy interstitial changes in the lungs similar to previous study. No focal consolidation or airspace disease. No  blunting of costophrenic angles. No pneumothorax. Power port type central venous catheter with tip over the cavoatrial junction.  IMPRESSION: Cardiac enlargement with stable patchy interstitial changes in the lungs. No acute infiltration.   Electronically Signed   By: Lucienne Capers M.D.   On: 07/14/2014 06:09    Review of Systems  Constitutional: Negative.   Eyes: Negative.   Respiratory: Negative.   Cardiovascular: Negative.   Gastrointestinal: Negative.   Genitourinary: Negative.   Musculoskeletal: Positive for myalgias, back pain, joint pain and neck pain.  Skin: Negative.   Neurological: Negative.   Endo/Heme/Allergies: Negative.   Psychiatric/Behavioral: Negative.     Blood pressure 113/74, pulse 70, temperature 98.5 F (36.9 C), temperature source Oral, resp. rate 12, SpO2 99 %. Physical Exam  Constitutional: He is oriented to person, place, and time. He appears well-developed and well-nourished.  HENT:  Head: Normocephalic and atraumatic.  Right Ear: External ear normal.  Left Ear: External ear normal.  Mouth/Throat: Oropharynx is clear and moist.  Eyes: Conjunctivae and EOM are normal. Pupils are equal, round, and reactive to light.  Neck: Normal range of motion. Neck supple.  Cardiovascular: Normal rate, regular rhythm, normal heart sounds and intact distal pulses.   Respiratory: Effort normal and breath sounds normal.  GI: Soft. Bowel sounds are normal.  Musculoskeletal: Normal range of motion.  Neurological: He is alert and oriented to person, place, and time. He has normal reflexes.  Skin: Skin is warm and dry.  Psychiatric: He has a normal mood and affect.     Assessment/Plan A 34 year old gentleman admitted with sickle cell painful crisis.  #1 sickle cell painful crisis: Patient will be admitted and started on IV Dilaudid PCA with Toradol and IV hydration. We will monitor his response to treatment and when ready would be discharged home if his oxygen level is  stable.  #2 sickle cell anemia: Hemoglobin is stable but on the low side at 5.9. Patient may benefit from blood transfusion especially in the setting of hypoxemia if he agrees to stay.  #3 hypoxemia: Patient's chest x-ray this morning does not show any infiltrates. This could be related to his sickle cell crisis. Occupational oxygen and recheck it before he does discharge to see if it is better.  #4 history of pulmonary embolism: Patient currently on Xarelto. Not sure if he is taking his medications especially in the setting of his new hypoxia. He might end up getting the angiogram if hypoxemia persists. Meanwhile continue his home medications.  [name],[name] 07/14/2014, 10:44 AM

## 2014-07-14 NOTE — ED Notes (Signed)
Awake. Verbally responsive. A/O x4. Resp even and unlabored. No audible adventitious breath sounds noted. ABC's intact. IV patent and intact infusing NS at 131ml/hr without difficulty.

## 2014-07-14 NOTE — Progress Notes (Signed)
MD in room and discussed with patient the need to be admitted for further evaluation and treatment.  Patient insisted on not "being admitted", and is willing to sign AMA form.   This RN again talked with patient at length about needing additional treatment and the understanding of the AMA form.  Patient still not in agreement to being admitted and is willing to sign the AMA form.

## 2014-07-14 NOTE — ED Provider Notes (Signed)
CSN: [id number]     Arrival date & time 07/14/14  0443 History   First MD Initiated Contact with Patient 07/14/14 0524     Chief Complaint  Patient presents with  . Sickle Cell Pain Crisis     (Consider location/radiation/quality/duration/timing/severity/associated sxs/prior Treatment) Patient is a 34 y.o. male presenting with sickle cell pain. The history is provided by the patient.  Sickle Cell Pain Crisis Location:  Chest Severity:  Severe Onset quality:  Sudden Duration:  1 day Similar to previous crisis episodes: yes   Timing:  Constant Progression:  Worsening Chronicity:  New Usual hemoglobin level:  6-7 Date of last transfusion:  2 MONTHS AGO Relieved by:  Nothing Ineffective treatments:  Prescription drugs Associated symptoms: no cough, no fever and no shortness of breath     Past Medical History  Diagnosis Date  . Sickle cell anemia   . Blood transfusion   . Acute embolism and thrombosis of right internal jugular vein   . Hypokalemia   . Mood disorder   . History of pulmonary embolus (PE)   . Avascular necrosis   . Leukocytosis     Chronic  . Thrombocytosis     Chronic  . Hypertension   . History of Clostridium difficile infection   . Uses marijuana   . Chronic anticoagulation   . Functional asplenia   . Former smoker   . Second hand tobacco smoke exposure   . Alcohol consumption of one to four drinks per day   . Noncompliance with medication regimen   . Sickle-cell crisis with associated acute chest syndrome 05/13/2013  . Acute chest syndrome 06/18/2013  . Demand ischemia 01/02/2014   Past Surgical History  Procedure Laterality Date  . Right hip replacement      08/2006  . Cholecystectomy      01/2008  . Porta cath placement    . Porta cath removal    . Umbilical hernia repair      01/2008  . Excision of left periauricular cyst      10/2009  . Excision of right ear lobe cyst with primary closur      11/2007  . Portacath placement  01/05/2012   Procedure: INSERTION PORT-A-CATH;  Surgeon: Odis Hollingshead, MD;  Location: [facility];  Service: General;  Laterality: N/A;  ultrasound guiced port a cath insertion with fluoroscopy   Family History  Problem Relation Age of Onset  . Sickle cell trait Mother   . Depression Mother   . Diabetes Mother   . Sickle cell trait Father   . Sickle cell trait Brother    History  Substance Use Topics  . Smoking status: Former Smoker -- 13 years    Quit date: 07/08/2010  . Smokeless tobacco: Never Used  . Alcohol Use: No    Review of Systems  Constitutional: Negative for fever.  Respiratory: Negative for cough and shortness of breath.   All other systems reviewed and are negative.     Allergies  Review of patient's allergies indicates no known allergies.  Home Medications   Prior to Admission medications   Medication Sig Start Date End Date Taking? Authorizing Provider  diltiazem (CARDIZEM CD) 120 MG 24 hr capsule Take 1 capsule (120 mg total) by mouth daily. 03/13/14  Yes Costin Karlyne Greenspan, MD  folic acid (FOLVITE) 1 MG tablet Take 1 tablet (1 mg total) by mouth every morning. 10/08/13  Yes [name], MD  HYDROmorphone (DILAUDID) 4 MG tablet Take 1  tablet (4 mg total) by mouth every 4 (four) hours as needed for severe pain. 07/07/14  Yes Dorena Dew, FNP  hydroxyurea (HYDREA) 500 MG capsule Take 3 capsules (1,500 mg total) by mouth daily. May take with food to minimize GI side effects. 10/30/13  Yes [name], MD  lisinopril (PRINIVIL,ZESTRIL) 10 MG tablet Take 1 tablet (10 mg total) by mouth daily. 01/22/14  Yes [name], MD  metoprolol tartrate (LOPRESSOR) 25 MG tablet Take 1 tablet (25 mg total) by mouth daily. 08/22/13  Yes [name], MD  morphine (MS CONTIN) 30 MG 12 hr tablet Take 1 tablet (30 mg total) by mouth every 12 (twelve) hours. 07/07/14  Yes Dorena Dew, FNP  potassium chloride SA (K-DUR,KLOR-CON) 20 MEQ tablet Take 1 tablet (20 mEq  total) by mouth every morning. 06/09/14  Yes [name], MD  rivaroxaban (XARELTO) 20 MG TABS tablet Take 1 tablet (20 mg total) by mouth every morning. 03/13/14  Yes Costin Karlyne Greenspan, MD  zolpidem (AMBIEN) 10 MG tablet Take 1 tablet (10 mg total) by mouth at bedtime as needed for sleep. 07/09/14  Yes Dorena Dew, FNP  Cholecalciferol (VITAMIN D) 2000 UNITS tablet Take 1 tablet (2,000 Units total) by mouth daily. Patient not taking: Reported on 07/07/2014 02/13/14   [name], MD  metroNIDAZOLE (FLAGYL) 500 MG tablet Take 1 tablet (500 mg total) by mouth every 8 (eight) hours. Patient not taking: Reported on 07/07/2014 07/01/14   [name], MD   BP 119/79 mmHg  Pulse 87  Temp(Src) 98.5 F (36.9 C) (Oral)  Resp 12  SpO2 94% Physical Exam  Constitutional: He is oriented to person, place, and time. He appears well-developed.  HENT:  Head: Normocephalic and atraumatic.  Eyes: Conjunctivae and EOM are normal. Pupils are equal, round, and reactive to light.  Neck: Normal range of motion. Neck supple.  Cardiovascular: Normal rate and regular rhythm.   Murmur heard. Pulmonary/Chest: Effort normal and breath sounds normal.  Abdominal: Soft. Bowel sounds are normal. He exhibits no distension. There is no tenderness. There is no rebound and no guarding.  Neurological: He is alert and oriented to person, place, and time.  Skin: Skin is warm.  Nursing note and vitals reviewed.   ED Course  Procedures (including critical care time) Labs Review Labs Reviewed  CBC WITH DIFFERENTIAL/PLATELET - Abnormal; Notable for the following:    WBC 16.8 (*)    RBC 1.77 (*)    Hemoglobin 5.8 (*)    HCT 17.5 (*)    RDW 21.0 (*)    Platelets 659 (*)    Monocytes Relative 14 (*)    Basophils Relative 2 (*)    nRBC 1 (*)    Neutro Abs 9.4 (*)    Monocytes Absolute 2.4 (*)    Basophils Absolute 0.3 (*)    All other components within normal limits  COMPREHENSIVE METABOLIC PANEL -  Abnormal; Notable for the following:    Glucose, Bld 104 (*)    Calcium 8.7 (*)    ALT 15 (*)    Total Bilirubin 5.6 (*)    All other components within normal limits  RETICULOCYTES - Abnormal; Notable for the following:    Retic Ct Pct 19.1 (*)    RBC. 1.77 (*)    Retic Count, Manual 338.1 (*)    All other components within normal limits  TYPE AND SCREEN    Imaging Review Dg Chest 2 View  07/14/2014   CLINICAL DATA:  Sickle cell pain crisis. Mid to left upper back pain. Weakness. Started several hours ago.  EXAM: CHEST  2 VIEW  COMPARISON:  06/25/2014  FINDINGS: Cardiac enlargement with patchy interstitial changes in the lungs similar to previous study. No focal consolidation or airspace disease. No blunting of costophrenic angles. No pneumothorax. Power port type central venous catheter with tip over the cavoatrial junction.  IMPRESSION: Cardiac enlargement with stable patchy interstitial changes in the lungs. No acute infiltration.   Electronically Signed   By: Lucienne Capers M.D.   On: 07/14/2014 06:09     EKG Interpretation None      MDM   Final diagnoses:  Chest pain  Sickle cell pain crisis  Hyperbilirubinemia    Pt with [place] anemia comes in with chest pain. Pt has severe pain. 2nd visit in 2 days. Hb is slowly trending down, and bili is trending up. Spoke with Dr. Jonelle Sidle - he thinks it is best that we admit the patient rather than try day clinic - given the worsening indices and the patient being complex.      Varney Biles, MD 07/14/14 [phone]

## 2014-07-14 NOTE — ED Notes (Signed)
RN is getting type and screen.

## 2014-07-14 NOTE — ED Notes (Signed)
Pt complains of upper back and rib pain that started a few hours ago

## 2014-07-14 NOTE — Progress Notes (Signed)
Port deaccessed; pt alert, oriented, and ambulatory; AMA form signed; pt verbalizes understanding of signing form

## 2014-07-17 LAB — TYPE AND SCREEN
ABO/RH(D): O POS
Antibody Screen: NEGATIVE
Unit division: 0

## 2014-07-20 ENCOUNTER — Emergency Department (HOSPITAL_COMMUNITY): Payer: Medicare Other

## 2014-07-20 ENCOUNTER — Inpatient Hospital Stay (HOSPITAL_COMMUNITY)
Admission: EM | Admit: 2014-07-20 | Discharge: 2014-07-24 | DRG: 175 | Disposition: A | Discharge status: Home / Self Care | Payer: Medicare Other | Attending: Internal Medicine | Admitting: Internal Medicine

## 2014-07-20 ENCOUNTER — Encounter (HOSPITAL_COMMUNITY): Payer: Self-pay | Admitting: Emergency Medicine

## 2014-07-20 DIAGNOSIS — I10 Essential (primary) hypertension: Secondary | ICD-10-CM | POA: Diagnosis present

## 2014-07-20 DIAGNOSIS — I4891 Unspecified atrial fibrillation: Secondary | ICD-10-CM | POA: Diagnosis present

## 2014-07-20 DIAGNOSIS — Z7982 Long term (current) use of aspirin: Secondary | ICD-10-CM

## 2014-07-20 DIAGNOSIS — Z9049 Acquired absence of other specified parts of digestive tract: Secondary | ICD-10-CM | POA: Diagnosis present

## 2014-07-20 DIAGNOSIS — R0789 Other chest pain: Secondary | ICD-10-CM | POA: Diagnosis not present

## 2014-07-20 DIAGNOSIS — Z79891 Long term (current) use of opiate analgesic: Secondary | ICD-10-CM

## 2014-07-20 DIAGNOSIS — D5700 Hb-SS disease with crisis, unspecified: Secondary | ICD-10-CM | POA: Diagnosis not present

## 2014-07-20 DIAGNOSIS — R7989 Other specified abnormal findings of blood chemistry: Secondary | ICD-10-CM | POA: Diagnosis not present

## 2014-07-20 DIAGNOSIS — F1290 Cannabis use, unspecified, uncomplicated: Secondary | ICD-10-CM | POA: Diagnosis present

## 2014-07-20 DIAGNOSIS — Z96641 Presence of right artificial hip joint: Secondary | ICD-10-CM | POA: Diagnosis present

## 2014-07-20 DIAGNOSIS — D649 Anemia, unspecified: Secondary | ICD-10-CM | POA: Diagnosis present

## 2014-07-20 DIAGNOSIS — I2699 Other pulmonary embolism without acute cor pulmonale: Secondary | ICD-10-CM | POA: Diagnosis present

## 2014-07-20 DIAGNOSIS — Z79899 Other long term (current) drug therapy: Secondary | ICD-10-CM

## 2014-07-20 DIAGNOSIS — R0609 Other forms of dyspnea: Secondary | ICD-10-CM | POA: Diagnosis present

## 2014-07-20 DIAGNOSIS — I4949 Other premature depolarization: Secondary | ICD-10-CM | POA: Diagnosis not present

## 2014-07-20 DIAGNOSIS — G894 Chronic pain syndrome: Secondary | ICD-10-CM | POA: Diagnosis present

## 2014-07-20 DIAGNOSIS — Z9114 Patient's other noncompliance with medication regimen: Secondary | ICD-10-CM | POA: Diagnosis present

## 2014-07-20 DIAGNOSIS — R0902 Hypoxemia: Secondary | ICD-10-CM | POA: Diagnosis present

## 2014-07-20 DIAGNOSIS — R079 Chest pain, unspecified: Secondary | ICD-10-CM | POA: Diagnosis not present

## 2014-07-20 DIAGNOSIS — I248 Other forms of acute ischemic heart disease: Secondary | ICD-10-CM | POA: Diagnosis present

## 2014-07-20 DIAGNOSIS — I2609 Other pulmonary embolism with acute cor pulmonale: Principal | ICD-10-CM | POA: Diagnosis present

## 2014-07-20 DIAGNOSIS — D72829 Elevated white blood cell count, unspecified: Secondary | ICD-10-CM | POA: Diagnosis present

## 2014-07-20 DIAGNOSIS — Z86711 Personal history of pulmonary embolism: Secondary | ICD-10-CM

## 2014-07-20 DIAGNOSIS — D599 Acquired hemolytic anemia, unspecified: Secondary | ICD-10-CM | POA: Diagnosis not present

## 2014-07-20 DIAGNOSIS — I270 Primary pulmonary hypertension: Secondary | ICD-10-CM

## 2014-07-20 DIAGNOSIS — D571 Sickle-cell disease without crisis: Secondary | ICD-10-CM | POA: Diagnosis not present

## 2014-07-20 DIAGNOSIS — E860 Dehydration: Secondary | ICD-10-CM | POA: Diagnosis present

## 2014-07-20 DIAGNOSIS — Q8901 Asplenia (congenital): Secondary | ICD-10-CM

## 2014-07-20 DIAGNOSIS — I272 Other secondary pulmonary hypertension: Secondary | ICD-10-CM | POA: Diagnosis present

## 2014-07-20 DIAGNOSIS — D638 Anemia in other chronic diseases classified elsewhere: Secondary | ICD-10-CM | POA: Diagnosis not present

## 2014-07-20 DIAGNOSIS — Z87891 Personal history of nicotine dependence: Secondary | ICD-10-CM

## 2014-07-20 DIAGNOSIS — I493 Ventricular premature depolarization: Secondary | ICD-10-CM | POA: Diagnosis not present

## 2014-07-20 DIAGNOSIS — E559 Vitamin D deficiency, unspecified: Secondary | ICD-10-CM | POA: Diagnosis present

## 2014-07-20 DIAGNOSIS — I517 Cardiomegaly: Secondary | ICD-10-CM | POA: Diagnosis not present

## 2014-07-20 DIAGNOSIS — I2781 Cor pulmonale (chronic): Secondary | ICD-10-CM | POA: Diagnosis not present

## 2014-07-20 DIAGNOSIS — Z7901 Long term (current) use of anticoagulants: Secondary | ICD-10-CM | POA: Diagnosis not present

## 2014-07-20 DIAGNOSIS — I509 Heart failure, unspecified: Secondary | ICD-10-CM | POA: Diagnosis present

## 2014-07-20 LAB — COMPREHENSIVE METABOLIC PANEL
ALT: 17 U/L (ref 17–63)
AST: 35 U/L (ref 15–41)
Albumin: 3.9 g/dL (ref 3.5–5.0)
Alkaline Phosphatase: 82 U/L (ref 38–126)
Anion gap: 7 (ref 5–15)
BUN: 11 mg/dL (ref 6–20)
CO2: 21 mmol/L — ABNORMAL LOW (ref 22–32)
Calcium: 8.6 mg/dL — ABNORMAL LOW (ref 8.9–10.3)
Chloride: 112 mmol/L — ABNORMAL HIGH (ref 101–111)
Creatinine, Ser: 0.71 mg/dL (ref 0.61–1.24)
GFR calc Af Amer: >60 mL/min (ref >60)
GFR calc non Af Amer: >60 mL/min (ref >60)
Glucose, Bld: 117 mg/dL — ABNORMAL HIGH (ref 65–99)
Potassium: 3.6 mmol/L (ref 3.5–5.1)
Sodium: 140 mmol/L (ref 135–145)
Total Bilirubin: 7 mg/dL — ABNORMAL HIGH (ref 0.3–1.2)
Total Protein: 7.5 g/dL (ref 6.5–8.1)

## 2014-07-20 LAB — CBC WITH DIFFERENTIAL/PLATELET
Basophils Absolute: 0.2 10*3/uL — ABNORMAL HIGH (ref 0.0–0.1)
Basophils Relative: 1 % (ref 0–1)
Eosinophils Absolute: 0.3 10*3/uL (ref 0.0–0.7)
Eosinophils Relative: 2 % (ref 0–5)
HCT: 19.8 % — ABNORMAL LOW (ref 39.0–52.0)
HEMOGLOBIN: 6.8 g/dL — AB (ref 13.0–17.0)
LYMPHS ABS: 1 10*3/uL (ref 0.7–4.0)
LYMPHS PCT: 6 % — AB (ref 12–46)
MCH: 32.4 pg (ref 26.0–34.0)
MCHC: 34.3 g/dL (ref 30.0–36.0)
MCV: 94.3 fL (ref 78.0–100.0)
MONO ABS: 2 10*3/uL — AB (ref 0.1–1.0)
MONOS PCT: 12 % (ref 3–12)
NEUTROS PCT: 79 % — AB (ref 43–77)
Neutro Abs: 13.3 10*3/uL — ABNORMAL HIGH (ref 1.7–7.7)
Platelets: 583 10*3/uL — ABNORMAL HIGH (ref 150–400)
RBC: 2.1 MIL/uL — AB (ref 4.22–5.81)
RDW: 21.1 % — AB (ref 11.5–15.5)
WBC: 16.8 10*3/uL — ABNORMAL HIGH (ref 4.0–10.5)

## 2014-07-20 LAB — RETICULOCYTES
RBC.: 2.1 MIL/uL — ABNORMAL LOW (ref 4.22–5.81)
Retic Ct Pct: >23 % — ABNORMAL HIGH (ref 0.4–3.1)

## 2014-07-20 LAB — PREPARE RBC (CROSSMATCH)

## 2014-07-20 LAB — LACTATE DEHYDROGENASE: LDH: 414 U/L — ABNORMAL HIGH (ref 98–192)

## 2014-07-20 LAB — TROPONIN I
TROPONIN I: 4.6 ng/mL — AB (ref <0.031)
Troponin I: 0.67 ng/mL (ref <0.031)

## 2014-07-20 MED ORDER — DIPHENHYDRAMINE HCL 25 MG PO CAPS: 25.0000 mg | ORAL_CAPSULE | Freq: Once | ORAL | Status: AC

## 2014-07-20 MED ORDER — NALOXONE HCL 0.4 MG/ML IJ SOLN: 0.4000 mg | INTRAMUSCULAR | Status: DC | PRN

## 2014-07-20 MED ORDER — ZOLPIDEM TARTRATE 10 MG PO TABS
10.0000 mg | ORAL_TABLET | Freq: Every evening | ORAL | Status: DC | PRN
  Administered 2014-07-20 – 2014-07-23 (×3): 10 mg via ORAL
  Filled 2014-07-20 (×3): qty 1

## 2014-07-20 MED ORDER — POTASSIUM CHLORIDE CRYS ER 20 MEQ PO TBCR
20.0000 meq | EXTENDED_RELEASE_TABLET | Freq: Every morning | ORAL | Status: DC
  Administered 2014-07-20 – 2014-07-23 (×4): 20 meq via ORAL
  Filled 2014-07-20 (×4): qty 1

## 2014-07-20 MED ORDER — HEPARIN (PORCINE) IN NACL 100-0.45 UNIT/ML-% IJ SOLN
1200.0000 [IU]/h | INTRAMUSCULAR | Status: DC
  Filled 2014-07-20 (×2): qty 250

## 2014-07-20 MED ORDER — IOHEXOL 350 MG/ML SOLN
100.0000 mL | Freq: Once | INTRAVENOUS | Status: AC | PRN
  Administered 2014-07-20: 100 mL via INTRAVENOUS

## 2014-07-20 MED ORDER — ONDANSETRON HCL 4 MG/2ML IJ SOLN
4.0000 mg | Freq: Four times a day (QID) | INTRAMUSCULAR | Status: DC | PRN
  Administered 2014-07-23: 4 mg via INTRAVENOUS
  Filled 2014-07-20: qty 2

## 2014-07-20 MED ORDER — KETOROLAC TROMETHAMINE 30 MG/ML IJ SOLN: 30.0000 mg | Freq: Four times a day (QID) | INTRAMUSCULAR | Status: DC

## 2014-07-20 MED ORDER — HYDROMORPHONE HCL 2 MG/ML IJ SOLN
2.0000 mg | Freq: Once | INTRAMUSCULAR | Status: AC
  Administered 2014-07-20: 2 mg via INTRAVENOUS
  Filled 2014-07-20: qty 1

## 2014-07-20 MED ORDER — HYDROMORPHONE 2 MG/ML HIGH CONCENTRATION IV PCA SOLN
INTRAVENOUS | Status: DC
  Administered 2014-07-20: 3.2 mg via INTRAVENOUS
  Administered 2014-07-20: 15:00:00 via INTRAVENOUS
  Administered 2014-07-21: 7.2 mg via INTRAVENOUS
  Administered 2014-07-21: 4.8 mg via INTRAVENOUS
  Administered 2014-07-21: 3.2 mg via INTRAVENOUS
  Administered 2014-07-21: 2.4 mg via INTRAVENOUS
  Administered 2014-07-21: 4 mg via INTRAVENOUS
  Administered 2014-07-21: 8.8 mg via INTRAVENOUS
  Administered 2014-07-21: 9.6 mg via INTRAVENOUS
  Administered 2014-07-22: 01:00:00 via INTRAVENOUS
  Administered 2014-07-22: 8 mg via INTRAVENOUS
  Administered 2014-07-22: 4 mg via INTRAVENOUS
  Administered 2014-07-22: 10.4 mg via INTRAVENOUS
  Administered 2014-07-22: 5.6 mg via INTRAVENOUS
  Administered 2014-07-23: 12.8 mg via INTRAVENOUS
  Administered 2014-07-23: 10:00:00 via INTRAVENOUS
  Administered 2014-07-23: 7.4 mg via INTRAVENOUS
  Administered 2014-07-23 – 2014-07-24 (×2): 2.4 mg via INTRAVENOUS
  Administered 2014-07-24: 1.6 mg via INTRAVENOUS
  Administered 2014-07-24: 12 mg via INTRAVENOUS
  Filled 2014-07-20 (×3): qty 25

## 2014-07-20 MED ORDER — DIPHENHYDRAMINE HCL 50 MG/ML IJ SOLN
12.5000 mg | Freq: Four times a day (QID) | INTRAMUSCULAR | Status: DC | PRN
  Administered 2014-07-22 – 2014-07-23 (×4): 12.5 mg via INTRAVENOUS
  Filled 2014-07-20 (×4): qty 0.25

## 2014-07-20 MED ORDER — DIPHENHYDRAMINE HCL 12.5 MG/5ML PO ELIX
12.5000 mg | ORAL_SOLUTION | Freq: Four times a day (QID) | ORAL | Status: DC | PRN
Start: 2014-07-20 — End: 2014-07-24
  Filled 2014-07-20: qty 5

## 2014-07-20 MED ORDER — SODIUM CHLORIDE 0.9 % IJ SOLN: 9.0000 mL | INTRAMUSCULAR | Status: DC | PRN

## 2014-07-20 MED ORDER — DEXTROSE-NACL 5-0.45 % IV SOLN
INTRAVENOUS | Status: DC
  Administered 2014-07-20 – 2014-07-22 (×5): via INTRAVENOUS

## 2014-07-20 MED ORDER — LISINOPRIL 10 MG PO TABS
10.0000 mg | ORAL_TABLET | Freq: Every day | ORAL | Status: DC
  Filled 2014-07-20: qty 1

## 2014-07-20 MED ORDER — HEPARIN (PORCINE) IN NACL 100-0.45 UNIT/ML-% IJ SOLN
1000.0000 [IU]/h | INTRAMUSCULAR | Status: DC
  Administered 2014-07-20: 1000 [IU]/h via INTRAVENOUS
  Filled 2014-07-20: qty 250

## 2014-07-20 MED ORDER — DIPHENHYDRAMINE HCL 50 MG/ML IJ SOLN
25.0000 mg | Freq: Once | INTRAMUSCULAR | Status: AC
  Administered 2014-07-20: 25 mg via INTRAVENOUS
  Filled 2014-07-20: qty 1

## 2014-07-20 MED ORDER — SODIUM CHLORIDE 0.9 % IV SOLN
Freq: Once | INTRAVENOUS | Status: AC
  Administered 2014-07-20: 12:00:00 via INTRAVENOUS

## 2014-07-20 MED ORDER — SENNOSIDES-DOCUSATE SODIUM 8.6-50 MG PO TABS
1.0000 | ORAL_TABLET | Freq: Every day | ORAL | Status: DC
  Administered 2014-07-23: 1 via ORAL
  Filled 2014-07-20 (×2): qty 1

## 2014-07-20 MED ORDER — FOLIC ACID 1 MG PO TABS
1.0000 mg | ORAL_TABLET | Freq: Every morning | ORAL | Status: DC
  Administered 2014-07-20 – 2014-07-24 (×5): 1 mg via ORAL
  Filled 2014-07-20 (×5): qty 1

## 2014-07-20 MED ORDER — SODIUM CHLORIDE 0.9 % IV BOLUS (SEPSIS)
500.0000 mL | Freq: Once | INTRAVENOUS | Status: AC
  Administered 2014-07-20: 500 mL via INTRAVENOUS

## 2014-07-20 MED ORDER — METOPROLOL TARTRATE 25 MG PO TABS
25.0000 mg | ORAL_TABLET | Freq: Every day | ORAL | Status: DC
  Administered 2014-07-20 – 2014-07-24 (×5): 25 mg via ORAL
  Filled 2014-07-20 (×5): qty 1

## 2014-07-20 MED ORDER — VITAMIN D 1000 UNITS PO TABS
2000.0000 [IU] | ORAL_TABLET | Freq: Every day | ORAL | Status: DC
  Administered 2014-07-20 – 2014-07-24 (×5): 2000 [IU] via ORAL
  Filled 2014-07-20 (×6): qty 2

## 2014-07-20 MED ORDER — DILTIAZEM HCL ER COATED BEADS 120 MG PO CP24
120.0000 mg | ORAL_CAPSULE | Freq: Every day | ORAL | Status: DC
  Administered 2014-07-20 – 2014-07-24 (×5): 120 mg via ORAL
  Filled 2014-07-20 (×5): qty 1

## 2014-07-20 MED ORDER — MORPHINE SULFATE ER 30 MG PO TBCR
30.0000 mg | EXTENDED_RELEASE_TABLET | Freq: Two times a day (BID) | ORAL | Status: DC
  Administered 2014-07-20 – 2014-07-24 (×9): 30 mg via ORAL
  Filled 2014-07-20 (×9): qty 1

## 2014-07-20 MED ORDER — LORAZEPAM 2 MG/ML IJ SOLN
0.5000 mg | Freq: Once | INTRAMUSCULAR | Status: AC
  Administered 2014-07-20: 0.5 mg via INTRAVENOUS
  Filled 2014-07-20: qty 1

## 2014-07-20 MED ORDER — HEPARIN BOLUS VIA INFUSION
4000.0000 [IU] | Freq: Once | INTRAVENOUS | Status: AC
  Administered 2014-07-20: 4000 [IU] via INTRAVENOUS
  Filled 2014-07-20: qty 4000

## 2014-07-20 MED ORDER — HYDROXYUREA 500 MG PO CAPS
1500.0000 mg | ORAL_CAPSULE | Freq: Every day | ORAL | Status: DC
  Administered 2014-07-20 – 2014-07-24 (×5): 1500 mg via ORAL
  Filled 2014-07-20 (×5): qty 3

## 2014-07-20 NOTE — Progress Notes (Addendum)
ANTICOAGULATION CONSULT NOTE - Initial Consult  Pharmacy Consult for heparin Indication: acute PE  No Known Allergies  Patient Measurements:   Heparin Dosing Weight: 73.5kg  Vital Signs: Temp: 98.3 F (36.8 C) (06/19 0918) Temp Source: Oral (06/19 0918) BP: 137/94 mmHg (06/19 1253) Pulse Rate: 87 (06/19 1253)  Labs:  Recent Labs  07/20/14 1023  HGB 6.8*  HCT 19.8*  PLT 583*  CREATININE 0.71  TROPONINI 0.67*    Estimated Creatinine Clearance: 134 mL/min (by C-G formula based on Cr of 0.71).   Medical History: Past Medical History  Diagnosis Date  . Sickle cell anemia   . Blood transfusion   . Acute embolism and thrombosis of right internal jugular vein   . Hypokalemia   . Mood disorder   . History of pulmonary embolus (PE)   . Avascular necrosis   . Leukocytosis     Chronic  . Thrombocytosis     Chronic  . Hypertension   . History of Clostridium difficile infection   . Uses marijuana   . Chronic anticoagulation   . Functional asplenia   . Former smoker   . Second hand tobacco smoke exposure   . Alcohol consumption of one to four drinks per day   . Noncompliance with medication regimen   . Sickle-cell crisis with associated acute chest syndrome 05/13/2013  . Acute chest syndrome 06/18/2013  . Demand ischemia 01/02/2014   Assessment: 16 YOM with sickle cell and history of PE on xarelto.  Presents with shortness of breath, CTA chest reveals BL PE.  He states he is compliant with his xarelto (last dose 6/18 at 0930a).   Pharmacy ask to dose heparin gtt for acute PE. Heparin was started by ED, 4000 unit bolus and 1000 units/hr. Xarelto placed on hold.   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   Pager: [phone]  07/20/2014,3:16 PM

## 2014-07-20 NOTE — Progress Notes (Signed)
Dr. Alben Deeds notified of troponin  Result of 4.6.  Order given for an EKG.  EKG done. MD made aware of EKG result. No new orders given. Blood retrieved form lab and brought to unit to be transfused. Upon flushing PIV, site infiltrated. Blood then returned to lab until site can be obtained. This rn attempted x 2; but unsuccessful ( sites infiltrated). IV team paged. MD also made aware. Awaiting IV team for access. Madelin Headings,

## 2014-07-20 NOTE — ED Notes (Signed)
I have called report to Vanita Ingles, RN in ICU and will transport now.  He feels better and is in no distress.

## 2014-07-20 NOTE — ED Provider Notes (Signed)
CSN: [id number]     Arrival date & time 07/20/14  0908 History   First MD Initiated Contact with Patient 07/20/14 1012     Chief Complaint  Patient presents with  . Sickle Cell Pain Crisis     (Consider location/radiation/quality/duration/timing/severity/associated sxs/prior Treatment) Patient is a 34 y.o. male presenting with sickle cell pain. The history is provided by the patient (the pt complains of left side chest pain,  pt has a hx of sickle cell pain).  Sickle Cell Pain Crisis Location:  Chest Severity:  Moderate Onset quality:  Sudden Similar to previous crisis episodes: yes   Timing:  Constant Progression:  Worsening Chronicity:  New Associated symptoms: chest pain   Associated symptoms: no congestion, no cough, no fatigue and no headaches     Past Medical History  Diagnosis Date  . Sickle cell anemia   . Blood transfusion   . Acute embolism and thrombosis of right internal jugular vein   . Hypokalemia   . Mood disorder   . History of pulmonary embolus (PE)   . Avascular necrosis   . Leukocytosis     Chronic  . Thrombocytosis     Chronic  . Hypertension   . History of Clostridium difficile infection   . Uses marijuana   . Chronic anticoagulation   . Functional asplenia   . Former smoker   . Second hand tobacco smoke exposure   . Alcohol consumption of one to four drinks per day   . Noncompliance with medication regimen   . Sickle-cell crisis with associated acute chest syndrome 05/13/2013  . Acute chest syndrome 06/18/2013  . Demand ischemia 01/02/2014   Past Surgical History  Procedure Laterality Date  . Right hip replacement      08/2006  . Cholecystectomy      01/2008  . Porta cath placement    . Porta cath removal    . Umbilical hernia repair      01/2008  . Excision of left periauricular cyst      10/2009  . Excision of right ear lobe cyst with primary closur      11/2007  . Portacath placement  01/05/2012    Procedure: INSERTION PORT-A-CATH;   Surgeon: Odis Hollingshead, MD;  Location: [facility];  Service: General;  Laterality: N/A;  ultrasound guiced port a cath insertion with fluoroscopy   Family History  Problem Relation Age of Onset  . Sickle cell trait Mother   . Depression Mother   . Diabetes Mother   . Sickle cell trait Father   . Sickle cell trait Brother    History  Substance Use Topics  . Smoking status: Former Smoker -- 13 years    Quit date: 07/08/2010  . Smokeless tobacco: Never Used  . Alcohol Use: No    Review of Systems  Constitutional: Negative for appetite change and fatigue.  HENT: Negative for congestion, ear discharge and sinus pressure.   Eyes: Negative for discharge.  Respiratory: Negative for cough.   Cardiovascular: Positive for chest pain.  Gastrointestinal: Negative for abdominal pain and diarrhea.  Genitourinary: Negative for frequency and hematuria.  Musculoskeletal: Negative for back pain.  Skin: Negative for rash.  Neurological: Negative for seizures and headaches.  Psychiatric/Behavioral: Negative for hallucinations.      Allergies  Review of patient's allergies indicates no known allergies.  Home Medications   Prior to Admission medications   Medication Sig Start Date End Date Taking? Authorizing Provider  folic acid (FOLVITE) 1 MG  tablet Take 1 tablet (1 mg total) by mouth every morning. 10/08/13  Yes [name], MD  HYDROmorphone (DILAUDID) 4 MG tablet Take 1 tablet (4 mg total) by mouth every 4 (four) hours as needed for severe pain. 07/07/14  Yes Dorena Dew, FNP  hydroxyurea (HYDREA) 500 MG capsule Take 3 capsules (1,500 mg total) by mouth daily. May take with food to minimize GI side effects. 10/30/13  Yes [name], MD  lisinopril (PRINIVIL,ZESTRIL) 10 MG tablet Take 1 tablet (10 mg total) by mouth daily. 01/22/14  Yes [name], MD  metoprolol tartrate (LOPRESSOR) 25 MG tablet Take 1 tablet (25 mg total) by mouth daily. 08/22/13  Yes [name], MD  morphine (MS CONTIN) 30 MG 12 hr tablet Take 1 tablet (30 mg total) by mouth every 12 (twelve) hours. 07/07/14  Yes Dorena Dew, FNP  potassium chloride SA (K-DUR,KLOR-CON) 20 MEQ tablet Take 1 tablet (20 mEq total) by mouth every morning. 06/09/14  Yes [name], MD  rivaroxaban (XARELTO) 20 MG TABS tablet Take 1 tablet (20 mg total) by mouth every morning. 03/13/14  Yes Costin Karlyne Greenspan, MD  zolpidem (AMBIEN) 10 MG tablet Take 1 tablet (10 mg total) by mouth at bedtime as needed for sleep. 07/09/14  Yes Dorena Dew, FNP  Cholecalciferol (VITAMIN D) 2000 UNITS tablet Take 1 tablet (2,000 Units total) by mouth daily. Patient not taking: Reported on 07/07/2014 02/13/14   [name], MD  diltiazem (CARDIZEM CD) 120 MG 24 hr capsule Take 1 capsule (120 mg total) by mouth daily. Patient not taking: Reported on 07/20/2014 03/13/14   Caren Griffins, MD  metroNIDAZOLE (FLAGYL) 500 MG tablet Take 1 tablet (500 mg total) by mouth every 8 (eight) hours. Patient not taking: Reported on 07/07/2014 07/01/14   [name], MD   BP 126/87 mmHg  Pulse 94  Temp(Src) 98.3 F (36.8 C) (Oral)  Resp 20  SpO2 94% Physical Exam  Constitutional: He is oriented to person, place, and time. He appears well-developed.  HENT:  Head: Normocephalic.  Eyes: Conjunctivae and EOM are normal. No scleral icterus.  Neck: Neck supple. No thyromegaly present.  Cardiovascular: Normal rate and regular rhythm.  Exam reveals no gallop and no friction rub.   No murmur heard. Pulmonary/Chest: No stridor. He has no wheezes. He has no rales. He exhibits no tenderness.  Abdominal: He exhibits no distension. There is no tenderness. There is no rebound.  Musculoskeletal: Normal range of motion. He exhibits no edema.  Lymphadenopathy:    He has no cervical adenopathy.  Neurological: He is oriented to person, place, and time. He exhibits normal muscle tone. Coordination normal.  Skin: No rash  noted. No erythema.  Psychiatric: He has a normal mood and affect. His behavior is normal.    ED Course  Procedures (including critical care time) Labs Review Labs Reviewed  CBC WITH DIFFERENTIAL/PLATELET - Abnormal; Notable for the following:    WBC 16.8 (*)    RBC 2.10 (*)    Hemoglobin 6.8 (*)    HCT 19.8 (*)    RDW 21.1 (*)    Platelets 583 (*)    Neutrophils Relative % 79 (*)    Lymphocytes Relative 6 (*)    Neutro Abs 13.3 (*)    Monocytes Absolute 2.0 (*)    Basophils Absolute 0.2 (*)    All other components within normal limits  COMPREHENSIVE METABOLIC PANEL - Abnormal; Notable for  the following:    Chloride 112 (*)    CO2 21 (*)    Glucose, Bld 117 (*)    Calcium 8.6 (*)    Total Bilirubin 7.0 (*)    All other components within normal limits  RETICULOCYTES - Abnormal; Notable for the following:    Retic Ct Pct >23.0 (*)    RBC. 2.10 (*)    All other components within normal limits  TROPONIN I - Abnormal; Notable for the following:    Troponin I 0.67 (*)    All other components within normal limits  TYPE AND SCREEN  PREPARE RBC (CROSSMATCH)    Imaging Review Dg Chest 2 View  07/20/2014   CLINICAL DATA:  Chest pain for 1 day.  Sickle cell.  EXAM: CHEST  2 VIEW  COMPARISON:  07/14/2014  FINDINGS: Prominent cardiomegaly. Left subclavian Port-A-Cath device with its tip at the cavoatrial junction is stable. Interstitial changes towards lung bases are stable. No pneumothorax. No pleural effusion. No consolidation  IMPRESSION: Cardiomegaly and chronic basilar interstitial changes.   Electronically Signed   By: Marybelle Killings M.D.   On: 07/20/2014 10:12   Ct Angio Chest Pe W/cm &/or Wo Cm  07/20/2014   CLINICAL DATA:  Chest pain, sickle cell crisis  EXAM: CT ANGIOGRAPHY CHEST WITH CONTRAST  TECHNIQUE: Multidetector CT imaging of the chest was performed using the standard protocol during bolus administration of intravenous contrast. Multiplanar CT image reconstructions and  MIPs were obtained to evaluate the vascular anatomy.  CONTRAST:  14mL OMNIPAQUE IOHEXOL 350 MG/ML SOLN  COMPARISON:  07/20/2014 chest radiograph, chest CT 03/19/2014  FINDINGS: Mediastinum/Nodes: Left-sided Port-A-Cath in place with tip in the right atrium. Cardiomegaly noted. Right atrial prominence. No pericardial effusion. AP window lymphadenopathy persists, largest 1.0 cm image 32. Representative pretracheal node measures 0.7 cm image 37.  Pulmonary arterial dilation is reidentified measuring 4.0 cm maximal transverse diameter image 121 series 6. The examination is adequate for evaluation for acute pulmonary embolism up to and including the 3rd order pulmonary arteries. Multiple bilateral lower lobe filling defects are identified compatible with pulmonary embolism, for example image 178 series 6. Allowing for motion artifact, there is no evidence for right heart strain as by calculated RV: LV ratio of 0.8.  Lungs/Pleura: Areas of mosaic type per fusion are identified bilaterally, likely related to pulmonary emboli. Curvilinear bilateral lower lobe scarring or atelectasis is noted.  Upper abdomen: Normal  Musculoskeletal: Inhomogeneous trabecular pattern of the vertebral bodies is compatible with sickle cell deformity. No acute osseous abnormality.  Review of the MIP images confirms the above findings.  IMPRESSION: Small to moderate overall clot burden with bilateral predominantly lower lobe pulmonary emboli. No CT evidence for right heart strain.  Cardiomegaly.  Mosaic type perfusion pattern within the lung parenchyma compatible with pulmonary emboli.  Critical Value/emergent results were called by telephone at the time of interpretation on 07/20/2014 at 11:58 am to Dr. Milton Ferguson , who verbally acknowledged these results.   Electronically Signed   By: Conchita Paris M.D.   On: 07/20/2014 12:00     EKG Interpretation   Date/Time:  Sunday July 20 2014 10:30:24 EDT Ventricular Rate:  83 PR Interval:   177 QRS Duration: 115 QT Interval:  433 QTC Calculation: 509 R Axis:   -91 Text Interpretation:  Sinus rhythm Ventricular premature complex Left  atrial enlargement LAD, consider left anterior fascicular block  Nonspecific T abnrm, anterolateral leads Minimal ST elevation, lateral  leads Confirmed by Nam Vossler  MD, Broadus [name] [phone]) on 07/20/2014 12:59:23 PM     CRITICAL CARE Performed by: Weiland Tomich L Total critical care time:40 Critical care time was exclusive of separately billable procedures and treating other patients. Critical care was necessary to treat or prevent imminent or life-threatening deterioration. Critical care was time spent personally by me on the following activities: development of treatment plan with patient and/or surrogate as well as nursing, discussions with consultants, evaluation of patient's response to treatment, examination of patient, obtaining history from patient or surrogate, ordering and performing treatments and interventions, ordering and review of laboratory studies, ordering and review of radiographic studies, pulse oximetry and re-evaluation of patient's condition.  MDM   Final diagnoses:  Chest pain  PE (pulmonary embolism)    Admit for pe and chest pain    Milton Ferguson, MD 07/20/14 1300

## 2014-07-20 NOTE — ED Notes (Signed)
Critical Hgb 6.8 Critical Troponin 0.67  Read back and verified with lab staff Primary RN and Provider notified

## 2014-07-20 NOTE — Progress Notes (Signed)
Received page regarding pt's elevated troponin of 4.6. EKG was at baseline with no acute changes or ST elevations. Troponin leak is likely due to demand ischemia. Pt is to get 2 units of blood but there is a delay as his IV infiltrated. I went to check on pt and he was eating comfortably in his bed and was in no acute distress. He was more concerned on why I initially had him on a clear liquid diet. I explained that CLD was a trial as he reported repeated vomiting earlier this morning. He reported no new symptoms.  It is imperative that he soon get his blood. Blood will increase his oxygen carrying capacity and help his demand ischemia. Will continue to trend troponin. Will consider cardiology consult if troponins continue to trend up or any EKG changes are found.  Real Cons, MD

## 2014-07-20 NOTE — H&P (Signed)
[name] and Physical  [name] DOB: 07-27-1980 DOA: 07/20/2014   PCP: Gerald A., MD   Chief Complaint: pain  HPI: [name] is a 34yo male with sickle cell disease who presented with complaints of chest pain. He was recently seen in the day hospital on 07/14/14, where he was recommended for admission due to concern for PE but pt left AMA. He came into the ED this morning after experiencing CP that started suddenly last night. CP is located from his mid to lower sternum and extends to his left lower ribs. He reports feeling increased DOE for the past week. He can go about 10 steps before feeling SOB. He continues to sleep on 3 pillows, which is stable for him. He denies any dizziness, HA, weakness, syncope, palpitations, and fever. He is not using any home oxygen, although it was recommended in the past. He had two episodes of emesis that was yellow in color. Denies diarrhea, last bowel movement was 2 days ago. He states that he has been taking his Xarelto every morning and can not recall missing any doses.   In the ED, he was hypoxic down to 85% o2 sat. He was placed on 2LPM via nasal cannula. A CT was performed showing small to moderate overall clot burden with bilateral predominantly lower lobe pulmonary emboli. He was started on a heparin drip. He was given IV Dilaudid for his pain. His Hgb was down to 6.8 so 2 units PRBC were ordered to be given.  Review of Systems:  Constitutional: No weight loss, night sweats. +fatigue.  HEENT: No headaches seizures, vision changes, difficulty swallowing,Tooth/dental problems,Sore throat, No sneezing, itching, ear ache, nasal congestion, post nasal drip,  Cardio-vascular: +chest pain, No orthopnea, PND, swelling in lower extremities, anasarca, dizziness, palpitations  GI: No heartburn, indigestion, abdominal pain. + nausea, vomiting, diarrhea. No change in bowel habits or loss of appetite   Resp: +shortness of breath at rest. No excess mucus, no productive cough. No coughing up of blood.No change in color of mucus.No wheezing.No chest wall deformity  Skin: no new lesions or rash GU: no dysuria, change in color of urine, no urgency or frequency. No flank pain.  Musculoskeletal: No joint pain or swelling. No decreased range of motion. + rib pain.  Psych: No change in mood or affect. No depression or anxiety. No memory loss.    Past Medical History  Diagnosis Date  . Sickle cell anemia   . Blood transfusion   . Acute embolism and thrombosis of right internal jugular vein   . Hypokalemia   . Mood disorder   . History of pulmonary embolus (PE)   . Avascular necrosis   . Leukocytosis     Chronic  . Thrombocytosis     Chronic  . Hypertension   . History of Clostridium difficile infection   . Uses marijuana   . Chronic anticoagulation   . Functional asplenia   . Former smoker   . Second hand tobacco smoke exposure   . Alcohol consumption of one to four drinks per day   . Noncompliance with medication regimen   . Sickle-cell crisis with associated acute chest syndrome 05/13/2013  . Acute chest syndrome 06/18/2013  . Demand ischemia 01/02/2014   Past Surgical History  Procedure Laterality Date  . Right hip replacement      08/2006  . Cholecystectomy      01/2008  . Porta cath placement    . Porta  cath removal    . Umbilical hernia repair      01/2008  . Excision of left periauricular cyst      10/2009  . Excision of right ear lobe cyst with primary closur      11/2007  . Portacath placement  01/05/2012    Procedure: INSERTION PORT-A-CATH;  Surgeon: Odis Hollingshead, MD;  Location: [facility];  Service: General;  Laterality: N/A;  ultrasound guiced port a cath insertion with fluoroscopy   Social History:  reports that he quit smoking about 4 years ago. He has never used smokeless tobacco. He reports that he uses illicit drugs (Marijuana) about twice per week. He reports  that he does not drink alcohol.  No Known Allergies  Family History  Problem Relation Age of Onset  . Sickle cell trait Mother   . Depression Mother   . Diabetes Mother   . Sickle cell trait Father   . Sickle cell trait Brother    No current facility-administered medications on file prior to encounter.   Current Outpatient Prescriptions on File Prior to Encounter  Medication Sig Dispense Refill  . folic acid (FOLVITE) 1 MG tablet Take 1 tablet (1 mg total) by mouth every morning. 30 tablet 11  . HYDROmorphone (DILAUDID) 4 MG tablet Take 1 tablet (4 mg total) by mouth every 4 (four) hours as needed for severe pain. 90 tablet 0  . hydroxyurea (HYDREA) 500 MG capsule Take 3 capsules (1,500 mg total) by mouth daily. May take with food to minimize GI side effects. 60 capsule 3  . lisinopril (PRINIVIL,ZESTRIL) 10 MG tablet Take 1 tablet (10 mg total) by mouth daily. 30 tablet 1  . metoprolol tartrate (LOPRESSOR) 25 MG tablet Take 1 tablet (25 mg total) by mouth daily. 30 tablet 6  . morphine (MS CONTIN) 30 MG 12 hr tablet Take 1 tablet (30 mg total) by mouth every 12 (twelve) hours. 60 tablet 0  . potassium chloride SA (K-DUR,KLOR-CON) 20 MEQ tablet Take 1 tablet (20 mEq total) by mouth every morning. 30 tablet 3  . rivaroxaban (XARELTO) 20 MG TABS tablet Take 1 tablet (20 mg total) by mouth every morning. 30 tablet 2  . zolpidem (AMBIEN) 10 MG tablet Take 1 tablet (10 mg total) by mouth at bedtime as needed for sleep. 30 tablet 0  . Cholecalciferol (VITAMIN D) 2000 UNITS tablet Take 1 tablet (2,000 Units total) by mouth daily. (Patient not taking: Reported on 07/07/2014) 90 tablet 3  . diltiazem (CARDIZEM CD) 120 MG 24 hr capsule Take 1 capsule (120 mg total) by mouth daily. (Patient not taking: Reported on 07/20/2014) 30 capsule 1  . metroNIDAZOLE (FLAGYL) 500 MG tablet Take 1 tablet (500 mg total) by mouth every 8 (eight) hours. (Patient not taking: Reported on 07/07/2014) 15 tablet 0     Physical Exam: Filed Vitals:   07/20/14 0919 07/20/14 1026 07/20/14 1106 07/20/14 1253  BP:  132/110 126/87 137/94  Pulse:  87 94 87  Temp:      TempSrc:      Resp:  16 20 16   SpO2: 95% 94% 94% 99%   General: Alert, awake, oriented x3, mild distress and discomfort.  HEENT: [place]/AT PEERL, EOMI, 1+scleral icterus Neck: Trachea midline,  no masses, no thyromegaly no JVD, no carotid bruit, no lymphadenopathy OROPHARYNX:  Moist, No exudate/ erythema/lesions.  Heart: tachycardic and regular rhythm, without audible murmur Lungs: Clear to auscultation, no wheezing or rhonchi noted. No increased vocal fremitus resonant to percussion  Abdomen: Soft, nontender, nondistended, positive bowel sounds, no masses no hepatosplenomegaly noted. Neuro: No focal neurological deficits noted cranial nerves II through XII grossly intact. MSK: no warmth, swelling, or erythema Extremities: mild clubbing of fingers, no cyanosis, or edema.   Labs on Admission:   Basic Metabolic Panel:  Recent Labs Lab 07/14/14 0520 07/20/14 1023  NA 138 140  K 3.6 3.6  CL 109 112*  CO2 22 21*  GLUCOSE 104* 117*  BUN 6 11  CREATININE 0.64 0.71  CALCIUM 8.7* 8.6*   Liver Function Tests:  Recent Labs Lab 07/14/14 0520 07/20/14 1023  AST 30 35  ALT 15* 17  ALKPHOS 71 82  BILITOT 5.6* 7.0*  PROT 7.4 7.5  ALBUMIN 3.8 3.9   CBC:  Recent Labs Lab 07/14/14 0520 07/14/14 1115 07/20/14 1023  WBC 16.8* 17.9* 16.8*  NEUTROABS 9.4* 10.4* 13.3*  HGB 5.8* 6.3* 6.8*  HCT 17.5* 18.9* 19.8*  MCV 98.9 99.5 94.3  PLT 659* 662* 583*   BNP: Invalid input(s): POCBNP   Radiological Exams on Admission: Dg Chest 2 View  07/20/2014   CLINICAL DATA:  Chest pain for 1 day.  Sickle cell.  EXAM: CHEST  2 VIEW  COMPARISON:  07/14/2014  FINDINGS: Prominent cardiomegaly. Left subclavian Port-A-Cath device with its tip at the cavoatrial junction is stable. Interstitial changes towards lung bases are stable. No  pneumothorax. No pleural effusion. No consolidation  IMPRESSION: Cardiomegaly and chronic basilar interstitial changes.   Electronically Signed   By: Marybelle Killings M.D.   On: 07/20/2014 10:12   Ct Angio Chest Pe W/cm &/or Wo Cm  07/20/2014   CLINICAL DATA:  Chest pain, sickle cell crisis  EXAM: CT ANGIOGRAPHY CHEST WITH CONTRAST  TECHNIQUE: Multidetector CT imaging of the chest was performed using the standard protocol during bolus administration of intravenous contrast. Multiplanar CT image reconstructions and MIPs were obtained to evaluate the vascular anatomy.  CONTRAST:  131mL OMNIPAQUE IOHEXOL 350 MG/ML SOLN  COMPARISON:  07/20/2014 chest radiograph, chest CT 03/19/2014  FINDINGS: Mediastinum/Nodes: Left-sided Port-A-Cath in place with tip in the right atrium. Cardiomegaly noted. Right atrial prominence. No pericardial effusion. AP window lymphadenopathy persists, largest 1.0 cm image 32. Representative pretracheal node measures 0.7 cm image 37.  Pulmonary arterial dilation is reidentified measuring 4.0 cm maximal transverse diameter image 121 series 6. The examination is adequate for evaluation for acute pulmonary embolism up to and including the 3rd order pulmonary arteries. Multiple bilateral lower lobe filling defects are identified compatible with pulmonary embolism, for example image 178 series 6. Allowing for motion artifact, there is no evidence for right heart strain as by calculated RV: LV ratio of 0.8.  Lungs/Pleura: Areas of mosaic type per fusion are identified bilaterally, likely related to pulmonary emboli. Curvilinear bilateral lower lobe scarring or atelectasis is noted.  Upper abdomen: Normal  Musculoskeletal: Inhomogeneous trabecular pattern of the vertebral bodies is compatible with sickle cell deformity. No acute osseous abnormality.  Review of the MIP images confirms the above findings.  IMPRESSION: Small to moderate overall clot burden with bilateral predominantly lower lobe pulmonary  emboli. No CT evidence for right heart strain.  Cardiomegaly.  Mosaic type perfusion pattern within the lung parenchyma compatible with pulmonary emboli.  Critical Value/emergent results were called by telephone at the time of interpretation on 07/20/2014 at 11:58 am to Dr. Milton Ferguson , who verbally acknowledged these results.   Electronically Signed   By: Conchita Paris M.D.   On: 07/20/2014 12:00  Assessment/Plan: Principal Problem:   Pulmonary embolism Active Problems:   Chronic anticoagulation   Anemia   Pulmonary embolus   Hb-SS disease with crisis 1. Respiratory: Pulmonary Embolism.         -Continue him on a heparin drip, started in ED. Pharmacy consult placed. -Hold Xarelto until more stable. Will restart Xarelto at therapeutic PE dose of 15mg  twice daily or Coumadin nightly (monitor compliance closer) tomorrow if he is stable.  -Critical care consulted -Supplemental O2 as needed to keep O2 sats above 95%. Cardiology: Troponin of 0.67 likely due to demand ischemia. EKG stable from previous EKG, with no ST elevations or arrythmias. -Monitor on telemetry.  -Trend troponins  -Continue home Dilitiazem -HTN is controlled. Continue home Metoprolol. Hold Lisinopril as below 2. Heme:  -Sickle Cell Anemia with crisis: Start on high dose Dilaudid PCA and Toradol. Continue MS contin for long acting.  -Anemia: Hgb of 6.8, lower than baseline. Due to worsening respiratory status, will transfuse him 2 units now, goal of 9.  -Continue his routine sickle cell care with folic acid and hydroxyurea.  3. Leukocytosis: Likely secondary to crisis. Will monitor as he has no signs of infection. 4. Increase in creatinine: creatinine for him is normally 0.4-0.5, now at 0.7. Likely due to mild dehydration. Will start IV fluids as below, and give a small 580ml NS bolus. Hold lisinopril. Will consider restarting Lisinopril and start Toradol if creatinine normalizes.   5. FEN/GI :D5/0.45% @100cc /hr.  Continue KDur for h/o hypokalemia and vitD for his vitD deficiency. Clear liquid diet, advance diet as tolerated.  Bowel regimen in place  Code Status: Full code DVT Prophylaxis: on Heparin gtt Family Communication: none Disposition Plan: pending improvement Time spent: >55 min  Kalman Shan, MD  Pager [phone]  If 7PM-7AM, please contact night-coverage [email or web address] Password [email or web address] 07/20/2014, 1:46 PM

## 2014-07-20 NOTE — Progress Notes (Signed)
CRITICAL VALUE ALERT  Critical value received: TROPONIN 4.6  Date of notification:  07/20/2014  Time of notification:  1713  Critical value read back:Yes.    Nurse who received alert:  Hale Bogus  MD notified (1st page):  Dr. Alben Deeds  Time of first page: 1716  MD notified (2nd page):  Time of second page:  Responding MD: Dr. Alben Deeds  Time MD responded: [phone]

## 2014-07-20 NOTE — ED Notes (Signed)
I attempted to collect patient lab and was unsuccessful.  I made nurse aware.

## 2014-07-20 NOTE — Consult Note (Signed)
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 He was maintained on Xarelto and claims compliance with this. He states that if he occasionally forgets, he takes it when he remembers. I note 25 admits in the last 6 months for sickle crises. He presented with chest pain, left-sided extending to left lower ribs and back with increasing dyspnea. He reports chronic 3 pillow orthopnea. He was noted to be hypoxic to 85% and required nasal cannula Chest pain is now improved with Dilaudid PCA Troponin was noted to be 0.67, hemoglobin was 6.8 so 2 units of blood were given We are asked to comment on the possibility of failure of Xarelto and alternative therapy  SIGNIFICANT EVENTS    STUDIES:  07/20/14 CT angiogram- moderate clot burden, predominantly bilateral lower lobe 03/2014 VQ scan- evidence of small peripheral chronic emboli 03/2014 CT angiogram-acute on chronic pump embolism with lobar clots. 12/2013 CT angio - small subsegmental clot in left upper lobe  01/2014 duplex neg  Echo 03/5425-[id number] RV systolic function, RVSP 76 mm    PAST MEDICAL HISTORY :   has a past medical history of Sickle cell anemia; Blood transfusion; Acute embolism and thrombosis of right internal jugular vein; Hypokalemia; Mood disorder; History of pulmonary embolus (PE); Avascular necrosis; Leukocytosis; Thrombocytosis; Hypertension; History of Clostridium difficile infection; Uses marijuana; Chronic anticoagulation; Functional asplenia; Former smoker; Second hand tobacco smoke exposure; Alcohol consumption of one to four drinks per day; Noncompliance with medication regimen; Sickle-cell crisis with associated acute chest syndrome (05/13/2013); Acute  chest syndrome (06/18/2013); and Demand ischemia (01/02/2014).  has past surgical history that includes Right hip replacement; Cholecystectomy; Porta cath placement; Porta cath removal; Umbilical hernia repair; Excision of left periauricular cyst; Excision of right ear lobe cyst with primary closur; and Portacath placement (01/05/2012). Prior to Admission medications   Medication Sig Start Date End Date Taking? Authorizing Provider  folic acid (FOLVITE) 1 MG tablet Take 1 tablet (1 mg total) by mouth every morning. 10/08/13  Yes [name], MD  HYDROmorphone (DILAUDID) 4 MG tablet Take 1 tablet (4 mg total) by mouth every 4 (four) hours as needed for severe pain. 07/07/14  Yes Dorena Dew, FNP  hydroxyurea (HYDREA) 500 MG capsule Take 3 capsules (1,500 mg total) by mouth daily. May take with food to minimize GI side effects. 10/30/13  Yes [name], MD  lisinopril (PRINIVIL,ZESTRIL) 10 MG tablet Take 1 tablet (10 mg total) by mouth daily. 01/22/14  Yes [name], MD  metoprolol tartrate (LOPRESSOR) 25 MG tablet Take 1 tablet (25 mg total) by mouth daily. 08/22/13  Yes [name], MD  morphine (MS CONTIN) 30 MG 12 hr tablet Take 1 tablet (30 mg total) by mouth every 12 (twelve) hours. 07/07/14  Yes Dorena Dew, FNP  potassium chloride SA (K-DUR,KLOR-CON) 20 MEQ tablet Take 1 tablet (20 mEq total) by mouth every morning. 06/09/14  Yes [name], MD  rivaroxaban (XARELTO) 20 MG TABS tablet Take 1 tablet (20 mg total) by mouth every morning. 03/13/14  Yes Costin Karlyne Greenspan, MD  zolpidem (AMBIEN) 10 MG tablet Take 1 tablet (10 mg total) by mouth at bedtime as needed for sleep. 07/09/14  Yes Dorena Dew,  FNP  Cholecalciferol (VITAMIN D) 2000 UNITS tablet Take 1 tablet (2,000 Units total) by mouth daily. Patient not taking: Reported on 07/07/2014 02/13/14   [name], MD  diltiazem (CARDIZEM CD) 120 MG 24 hr capsule Take 1 capsule (120 mg total) by mouth  daily. Patient not taking: Reported on 07/20/2014 03/13/14   Caren Griffins, MD  metroNIDAZOLE (FLAGYL) 500 MG tablet Take 1 tablet (500 mg total) by mouth every 8 (eight) hours. Patient not taking: Reported on 07/07/2014 07/01/14   [name], MD   No Known Allergies  FAMILY HISTORY:  family history includes Depression in his mother; Diabetes in his mother; Sickle cell trait in his brother, father, and mother. SOCIAL HISTORY:  reports that he quit smoking about 4 years ago. He has never used smokeless tobacco. He reports that he uses illicit drugs (Marijuana) about twice per week. He reports that he does not drink alcohol.  REVIEW OF SYSTEMS:   Constitutional: Negative for fever, chills, weight loss, malaise/fatigue and diaphoresis.  HENT: Negative for hearing loss, ear pain, nosebleeds, congestion, sore throat, neck pain, tinnitus and ear discharge.   Eyes: Negative for blurred vision, double vision, photophobia, pain, discharge and redness.  Respiratory: Negative for cough, hemoptysis, sputum production,  wheezing and stridor.   Cardiovascular: Negative for palpitations, orthopnea, claudication, leg swelling and PND.  Gastrointestinal: Negative for heartburn, nausea, vomiting, diarrhea, constipation, blood in stool and melena.  Genitourinary: Negative for dysuria, urgency, frequency, hematuria and flank pain.  Musculoskeletal: Negative for myalgias, back pain, joint pain and falls.  Skin: Negative for itching and rash.  Neurological: Negative for dizziness, tingling, tremors, sensory change, speech change, focal weakness, seizures, loss of consciousness, weakness and headaches.  Endo/Heme/Allergies: Negative for environmental allergies and polydipsia. Does not bruise/bleed easily.  SUBJECTIVE:   VITAL SIGNS: Temp:  [98.3 F (36.8 C)] 98.3 F (36.8 C) (06/19 0918) Pulse Rate:  [86-94] 87 (06/19 1253) Resp:  [13-20] 19 (06/19 1454) BP: (126-137)/(83-110) 137/94 mmHg (06/19  1253) SpO2:  [85 %-99 %] 97 % (06/19 1454)  PHYSICAL EXAMINATION: General:  Acutely ill-appearing, able to sit up in bed, on nasal cannula Neuro:  Interactive, distracted, non-focal, avoids eye contact HEENT:  No JVD, pallor +, icterus + Cardiovascular:  S1 and S2 tachycardia, ejection systolic murmur at base Lungs:  Clear, good air entry bilateral Abdomen:  Soft and nontender Musculoskeletal:  Good pulses Skin:  No rash   Recent Labs Lab 07/14/14 0520 07/20/14 1023  NA 138 140  K 3.6 3.6  CL 109 112*  CO2 22 21*  BUN 6 11  CREATININE 0.64 0.71  GLUCOSE 104* 117*    Recent Labs Lab 07/14/14 0520 07/14/14 1115 07/20/14 1023  HGB 5.8* 6.3* 6.8*  HCT 17.5* 18.9* 19.8*  WBC 16.8* 17.9* 16.8*  PLT 659* 662* 583*   Dg Chest 2 View  07/20/2014   CLINICAL DATA:  Chest pain for 1 day.  Sickle cell.  EXAM: CHEST  2 VIEW  COMPARISON:  07/14/2014  FINDINGS: Prominent cardiomegaly. Left subclavian Port-A-Cath device with its tip at the cavoatrial junction is stable. Interstitial changes towards lung bases are stable. No pneumothorax. No pleural effusion. No consolidation  IMPRESSION: Cardiomegaly and chronic basilar interstitial changes.   Electronically Signed   By: Marybelle Killings M.D.   On: 07/20/2014 10:12   Ct Angio Chest Pe W/cm &/or Wo Cm  07/20/2014   CLINICAL DATA:  Chest pain, sickle cell crisis  EXAM: CT ANGIOGRAPHY CHEST WITH  CONTRAST  TECHNIQUE: Multidetector CT imaging of the chest was performed using the standard protocol during bolus administration of intravenous contrast. Multiplanar CT image reconstructions and MIPs were obtained to evaluate the vascular anatomy.  CONTRAST:  190mL OMNIPAQUE IOHEXOL 350 MG/ML SOLN  COMPARISON:  07/20/2014 chest radiograph, chest CT 03/19/2014  FINDINGS: Mediastinum/Nodes: Left-sided Port-A-Cath in place with tip in the right atrium. Cardiomegaly noted. Right atrial prominence. No pericardial effusion. AP window lymphadenopathy persists,  largest 1.0 cm image 32. Representative pretracheal node measures 0.7 cm image 37.  Pulmonary arterial dilation is reidentified measuring 4.0 cm maximal transverse diameter image 121 series 6. The examination is adequate for evaluation for acute pulmonary embolism up to and including the 3rd order pulmonary arteries. Multiple bilateral lower lobe filling defects are identified compatible with pulmonary embolism, for example image 178 series 6. Allowing for motion artifact, there is no evidence for right heart strain as by calculated RV: LV ratio of 0.8.  Lungs/Pleura: Areas of mosaic type per fusion are identified bilaterally, likely related to pulmonary emboli. Curvilinear bilateral lower lobe scarring or atelectasis is noted.  Upper abdomen: Normal  Musculoskeletal: Inhomogeneous trabecular pattern of the vertebral bodies is compatible with sickle cell deformity. No acute osseous abnormality.  Review of the MIP images confirms the above findings.  IMPRESSION: Small to moderate overall clot burden with bilateral predominantly lower lobe pulmonary emboli. No CT evidence for right heart strain.  Cardiomegaly.  Mosaic type perfusion pattern within the lung parenchyma compatible with pulmonary emboli.  Critical Value/emergent results were called by telephone at the time of interpretation on 07/20/2014 at 11:58 am to Dr. Milton Ferguson , who verbally acknowledged these results.   Electronically Signed   By: Conchita Paris M.D.   On: 07/20/2014 12:00    ASSESSMENT / PLAN:  Acute on chronic pulmonary embolism Severe pulmonary hypertension  -I did not know of any data on failure of normal anticoagulant. The biggest issue here is compliance-he does report that he is taking Xarelto, there is no way to objectively verify. -Would certainly suggest using IV heparin for next 24-48 hours. -Can then transition to oral Coumadin with INR monitoring-I discussed this with him, he was not happy about the idea of frequent blood  draws, but this may be the best way to manage him going forward. -Would also suggest referring him to [facility] for management of pulmonary hypertension & CTEPH, if he is willing .  Sickle cell crisis- management per primary team  [facility] M will be available as needed, can transfer to telemetry in stable by tomorrow a.m.Kara Mead MD. Shade Flood. [place] Pager [phone] If no response call [phone]    07/20/2014, 4:04 PM

## 2014-07-20 NOTE — ED Notes (Addendum)
Patient c/o his typical sickle cell pain, onset this am. Patient c/o pain to chest and ribs. Patient placed on [facility] for decreased SPO2. Patient SPO2 94-97% on 2LNC. Patient has taken 3-30mg  MS Contin this am @ 0700. Patient states his typical dose for MS Contin is 2 pills PRN.

## 2014-07-21 ENCOUNTER — Encounter (HOSPITAL_COMMUNITY): Payer: Self-pay

## 2014-07-21 LAB — BASIC METABOLIC PANEL
Anion gap: 5 (ref 5–15)
BUN: 8 mg/dL (ref 6–20)
CALCIUM: 8.6 mg/dL — AB (ref 8.9–10.3)
CHLORIDE: 110 mmol/L (ref 101–111)
CO2: 22 mmol/L (ref 22–32)
CREATININE: 0.65 mg/dL (ref 0.61–1.24)
GFR calc non Af Amer: >60 mL/min (ref >60)
Glucose, Bld: 125 mg/dL — ABNORMAL HIGH (ref 65–99)
Potassium: 3.9 mmol/L (ref 3.5–5.1)
Sodium: 137 mmol/L (ref 135–145)

## 2014-07-21 LAB — TYPE AND SCREEN
ABO/RH(D): O POS
Antibody Screen: NEGATIVE
Donor AG Type: NEGATIVE
Donor AG Type: NEGATIVE
UNIT DIVISION: 0
Unit division: 0

## 2014-07-21 LAB — CBC WITH DIFFERENTIAL/PLATELET
BASOS ABS: 0.1 10*3/uL (ref 0.0–0.1)
BASOS PCT: 0 % (ref 0–1)
EOS ABS: 0.6 10*3/uL (ref 0.0–0.7)
EOS PCT: 4 % (ref 0–5)
HCT: 24.8 % — ABNORMAL LOW (ref 39.0–52.0)
HEMOGLOBIN: 8.4 g/dL — AB (ref 13.0–17.0)
LYMPHS PCT: 15 % (ref 12–46)
Lymphs Abs: 2.3 10*3/uL (ref 0.7–4.0)
MCH: 30.8 pg (ref 26.0–34.0)
MCHC: 33.9 g/dL (ref 30.0–36.0)
MCV: 90.8 fL (ref 78.0–100.0)
MONO ABS: 1.9 10*3/uL — AB (ref 0.1–1.0)
Monocytes Relative: 12 % (ref 3–12)
NEUTROS PCT: 69 % (ref 43–77)
Neutro Abs: 10.8 10*3/uL — ABNORMAL HIGH (ref 1.7–7.7)
Platelets: 489 10*3/uL — ABNORMAL HIGH (ref 150–400)
RBC: 2.73 MIL/uL — AB (ref 4.22–5.81)
RDW: 19.3 % — ABNORMAL HIGH (ref 11.5–15.5)
WBC: 15.7 10*3/uL — ABNORMAL HIGH (ref 4.0–10.5)

## 2014-07-21 LAB — PROTIME-INR
INR: 1.4 (ref 0.00–1.49)
PROTHROMBIN TIME: 17.3 s — AB (ref 11.6–15.2)

## 2014-07-21 LAB — LACTATE DEHYDROGENASE: LDH: 452 U/L — ABNORMAL HIGH (ref 98–192)

## 2014-07-21 LAB — RETICULOCYTES: RBC.: 2.73 MIL/uL — ABNORMAL LOW (ref 4.22–5.81)

## 2014-07-21 LAB — MRSA PCR SCREENING: MRSA BY PCR: NEGATIVE

## 2014-07-21 LAB — APTT
APTT: 46 s — AB (ref 24–37)
aPTT: >200 seconds (ref 24–37)
aPTT: 44 seconds — ABNORMAL HIGH (ref 24–37)

## 2014-07-21 LAB — HEPARIN LEVEL (UNFRACTIONATED): HEPARIN UNFRACTIONATED: 1.35 [IU]/mL — AB (ref 0.30–0.70)

## 2014-07-21 LAB — TROPONIN I
TROPONIN I: 5.11 ng/mL — AB (ref <0.031)
TROPONIN I: 3.66 ng/mL — AB (ref <0.031)

## 2014-07-21 MED ORDER — ENOXAPARIN SODIUM 80 MG/0.8ML ~~LOC~~ SOLN
75.0000 mg | Freq: Two times a day (BID) | SUBCUTANEOUS | Status: DC
  Administered 2014-07-21 – 2014-07-24 (×6): 75 mg via SUBCUTANEOUS
  Filled 2014-07-21 (×6): qty 0.8

## 2014-07-21 MED ORDER — HEPARIN (PORCINE) IN NACL 100-0.45 UNIT/ML-% IJ SOLN
1400.0000 [IU]/h | INTRAMUSCULAR | Status: DC
  Administered 2014-07-21: 1400 [IU]/h via INTRAVENOUS
  Filled 2014-07-21 (×3): qty 250

## 2014-07-21 MED ORDER — HEPARIN BOLUS VIA INFUSION
2000.0000 [IU] | Freq: Once | INTRAVENOUS | Status: AC
  Administered 2014-07-21: 2000 [IU] via INTRAVENOUS
  Filled 2014-07-21: qty 2000

## 2014-07-21 MED ORDER — WARFARIN SODIUM 5 MG PO TABS
10.0000 mg | ORAL_TABLET | Freq: Once | ORAL | Status: AC
Start: 2014-07-21 — End: 2014-07-21
  Administered 2014-07-21: 10 mg via ORAL
  Filled 2014-07-21: qty 2

## 2014-07-21 MED ORDER — HEPARIN (PORCINE) IN NACL 100-0.45 UNIT/ML-% IJ SOLN
1800.0000 [IU]/h | INTRAMUSCULAR | Status: DC
  Filled 2014-07-21: qty 250

## 2014-07-21 MED ORDER — WARFARIN - PHARMACIST DOSING INPATIENT: Freq: Every day | Status: DC

## 2014-07-21 MED ORDER — ENOXAPARIN (LOVENOX) PATIENT EDUCATION KIT
PACK | Freq: Once | Status: AC
Start: 2014-07-22 — End: 2014-07-22
  Administered 2014-07-22: 10:00:00
  Filled 2014-07-21: qty 1

## 2014-07-21 NOTE — Progress Notes (Signed)
CRITICAL VALUE ALERT  Critical value received:  PTT >200  Date of notification:  07/21/2014  Time of notification:  0253  Critical value read back:Yes.    Nurse who received alert:  Worthy Keeler, RN  MD notified (1st page):  Raliegh Ip Schorr  Time of first page:  [phone]  MD notified (2nd page):  Time of second page:  Responding MD:  [name]  Time MD responded:  [phone]

## 2014-07-21 NOTE — Care Management Note (Signed)
Case Management Note  Patient Details  Name: TAMIR WALLMAN MRN: [id number] Date of Birth: 13-Nov-1980  Subjective/Objective:                 ssc with chest pain   Action/Plan: tbd   Expected Discharge Date:                 [id number] Expected Discharge Plan:  Home/Self Care  In-House Referral:  NA  Discharge planning Services  CM Consult  Post Acute Care Choice:  NA Choice offered to:  NA  DME Arranged:  N/A DME Agency:  NA  HH Arranged:  NA HH Agency:  NA  Status of Service:  In process, will continue to follow  Medicare Important Message Given:    Date Medicare IM Given:    Medicare IM give by:    Date Additional Medicare IM Given:    Additional Medicare Important Message give by:     If discussed at [place] of Stay Meetings, dates discussed:    Additional Comments:  Leeroy Cha, RN 07/21/2014, 11:17 AM

## 2014-07-21 NOTE — Progress Notes (Signed)
Date:  July 21, 2014 U.R. performed for needs and level of care. Will continue to follow for Case Management needs.  Rhonda Davis, RN, BSN, CCM   [phone] 

## 2014-07-21 NOTE — Progress Notes (Signed)
Patient ID: [name], male   DOB: 1980/07/04, 34 y.o.   MRN: [id number]  SICKLE CELL SERVICE PROGRESS NOTE  MATEUSZ NEILAN WUJ:[id number] DOB: 07-Aug-1980 DOA: 07/20/2014 PCP: [name],Gerald A., MD   Consultants:  [place] on 6/19  Procedures:  none  Antibiotics:  none  HPI/Subjective: Pt reports that his right sided chest pain continues to improve but rates it at a 7/10 today. He feels that his breathing is about the same. He is maintaining an spO2 >92% on 4L [name]. He received 2 units of blood last night.  Objective: Filed Vitals:   07/21/14 0500 07/21/14 0735 07/21/14 0800 07/21/14 0818  BP: 125/80   130/81  Pulse: 77   76  Temp:   98.3 F (36.8 C)   TempSrc:   Oral   Resp: 16 15  15   Weight:      SpO2: 96% 96%  95%   Weight change:   Intake/Output Summary (Last 24 hours) at 07/21/14 1044 Last data filed at 07/21/14 0800  Gross per 24 hour  Intake 2698.24 ml  Output   1975 ml  Net 723.24 ml    General: Alert, awake, oriented x3, in no acute distress.  HEENT: [place]/AT PEERL, EOMI, nasal cannula in place OROPHARYNX:  Moist, No exudate/ erythema/lesions.  Heart: Regular rate and rhythm, without murmurs, rubs, gallops Lungs: Clear to auscultation, no wheezing or rhonchi noted. Abdomen: Soft, nontender, nondistended, positive bowel sounds, no masses no hepatosplenomegaly noted. Neuro: No focal neurological deficits noted  Extremities: no edema or cyanosis, mild clubbing   Data Reviewed: Basic Metabolic Panel:  Recent Labs Lab 07/20/14 1023 07/21/14 0208  NA 140 137  K 3.6 3.9  CL 112* 110  CO2 21* 22  GLUCOSE 117* 125*  BUN 11 8  CREATININE 0.71 0.65  CALCIUM 8.6* 8.6*   Liver Function Tests:  Recent Labs Lab 07/20/14 1023  AST 35  ALT 17  ALKPHOS 82  BILITOT 7.0*  PROT 7.5  ALBUMIN 3.9   CBC:  Recent Labs Lab 07/14/14 1115 07/20/14 1023 07/21/14 0208  WBC 17.9* 16.8* 15.7*  NEUTROABS 10.4* 13.3* 10.8*  HGB 6.3* 6.8*  8.4*  HCT 18.9* 19.8* 24.8*  MCV 99.5 94.3 90.8  PLT 662* 583* 489*    Recent Labs  01/01/14 0909  PROBNP 5790.0*   Studies: Dg Chest 2 View  07/20/2014   CLINICAL DATA:  Chest pain for 1 day.  Sickle cell.  EXAM: CHEST  2 VIEW  COMPARISON:  07/14/2014  FINDINGS: Prominent cardiomegaly. Left subclavian Port-A-Cath device with its tip at the cavoatrial junction is stable. Interstitial changes towards lung bases are stable. No pneumothorax. No pleural effusion. No consolidation  IMPRESSION: Cardiomegaly and chronic basilar interstitial changes.   Electronically Signed   By: Marybelle Killings M.D.   On: 07/20/2014 10:12   Dg Chest 2 View  07/14/2014   CLINICAL DATA:  Sickle cell pain crisis. Mid to left upper back pain. Weakness. Started several hours ago.  EXAM: CHEST  2 VIEW  COMPARISON:  06/25/2014  FINDINGS: Cardiac enlargement with patchy interstitial changes in the lungs similar to previous study. No focal consolidation or airspace disease. No blunting of costophrenic angles. No pneumothorax. Power port type central venous catheter with tip over the cavoatrial junction.  IMPRESSION: Cardiac enlargement with stable patchy interstitial changes in the lungs. No acute infiltration.   Electronically Signed   By: Lucienne Capers M.D.   On: 07/14/2014 06:09   Dg Chest 2 View  06/25/2014   CLINICAL DATA:  Sickle cell crisis, chest pain.  EXAM: CHEST  2 VIEW  COMPARISON:  Chest radiograph Jun 23, 2014  FINDINGS: Moderate cardiomegaly, unchanged from prior examination. Mediastinal silhouette is nonsuspicious. Diffuse slightly increasing interstitial prominence without pleural effusion or focal consolidation. No pneumothorax. 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 Single lumen LEFT chest Port-A-Cath with distal tip at cavoatrial junction.  IMPRESSION: Similar cardiomegaly. Mildly increasing interstitial prominence (atypical infection or pulmonary edema) without  focal consolidation.   Electronically Signed   By: Elon Alas M.D.   On: 06/25/2014 04:17   Dg Chest 2 View  06/24/2014   CLINICAL DATA:  Left flank pain, weakness, shortness of breath for 1 day, history of sickle cell disease  EXAM: CHEST  2 VIEW  COMPARISON:  Chest x-ray of 06/03/2014  FINDINGS: There is little change in cardiomegaly with minimal fullness of the perihilar vasculature. No frank congestive heart failure seen and no effusion is noted. Left Port-A-Cath is present with the tip seen to the expected SVC -RA junction. No bony abnormality is noted.  IMPRESSION: No significant change in cardiomegaly and perhaps minimal pulmonary vascular congestion. No effusion   Electronically Signed   By: Ivar Drape M.D.   On: 06/24/2014 08:26   Ct Angio Chest Pe W/cm &/or Wo Cm  07/20/2014   CLINICAL DATA:  Chest pain, sickle cell crisis  EXAM: CT ANGIOGRAPHY CHEST WITH CONTRAST  TECHNIQUE: Multidetector CT imaging of the chest was performed using the standard protocol during bolus administration of intravenous contrast. Multiplanar CT image reconstructions and MIPs were obtained to evaluate the vascular anatomy.  CONTRAST:  145mL OMNIPAQUE IOHEXOL 350 MG/ML SOLN  COMPARISON:  07/20/2014 chest radiograph, chest CT 03/19/2014  FINDINGS: Mediastinum/Nodes: Left-sided Port-A-Cath in place with tip in the right atrium. Cardiomegaly noted. Right atrial prominence. No pericardial effusion. AP window lymphadenopathy persists, largest 1.0 cm image 32. Representative pretracheal node measures 0.7 cm image 37.  Pulmonary arterial dilation is reidentified measuring 4.0 cm maximal transverse diameter image 121 series 6. The examination is adequate for evaluation for acute pulmonary embolism up to and including the 3rd order pulmonary arteries. Multiple bilateral lower lobe filling defects are identified compatible with pulmonary embolism, for example image 178 series 6. Allowing for motion artifact, there is no  evidence for right heart strain as by calculated RV: LV ratio of 0.8.  Lungs/Pleura: Areas of mosaic type per fusion are identified bilaterally, likely related to pulmonary emboli. Curvilinear bilateral lower lobe scarring or atelectasis is noted.  Upper abdomen: Normal  Musculoskeletal: Inhomogeneous trabecular pattern of the vertebral bodies is compatible with sickle cell deformity. No acute osseous abnormality.  Review of the MIP images confirms the above findings.  IMPRESSION: Small to moderate overall clot burden with bilateral predominantly lower lobe pulmonary emboli. No CT evidence for right heart strain.  Cardiomegaly.  Mosaic type perfusion pattern within the lung parenchyma compatible with pulmonary emboli.  Critical Value/emergent results were called by telephone at the time of interpretation on 07/20/2014 at 11:58 am to Dr. Milton Ferguson , who verbally acknowledged these results.   Electronically Signed   By: Conchita Paris M.D.   On: 07/20/2014 12:00    Scheduled Meds: . cholecalciferol  2,000 Units Oral Daily  . diltiazem  120 mg Oral Daily  . folic acid  1 mg Oral q morning - 10a  . HYDROmorphone PCA 2 mg/mL   Intravenous 6 times per day  .  hydroxyurea  1,500 mg Oral Daily  . metoprolol tartrate  25 mg Oral Daily  . morphine  30 mg Oral Q12H  . potassium chloride SA  20 mEq Oral q morning - 10a  . senna-docusate  1 tablet Oral QHS   Continuous Infusions: . dextrose 5 % and 0.45% NaCl 100 mL/hr at 07/21/14 0800  . heparin 1,400 Units/hr (07/21/14 0800)    Principal Problem:   Pulmonary embolism Active Problems:   Chronic anticoagulation   Anemia   Pulmonary embolus   Hb-SS disease with crisis   Assessment/Plan: Principal Problem:   Pulmonary embolism Active Problems:   Chronic anticoagulation   Anemia   Pulmonary embolus   Hb-SS disease with crisis  1. Respiratory: Pulmonary Embolism.  -Initially attempted to start pt on Warfarin tonight, however due to his  history of poor compliance and pt's hesitancy to agree to routine INR checks, warfarin may not be the best option for him. I will switch his anticoagulation to Lovenox daily. Pt is agreeable to performing injections at home, until we can have him follow-up in Dr. Azucena Freed anticoagulation clinic where he can be further assessed regarding the best treatment option for him. If pt was truly compliant with Xarelto, then he may need an increase in his dose when he is transitioned to prophylactic VTE treatment in the future.       -Warfarin consult was cancelled. Pharmacy notified about new consult placed to start Lovenox. -Supplemental O2 as needed to keep O2 sats above 95%.  2. Cardiology: Troponin trend increased to a peak of 5.1 and subsequently have come down to 3.66 this morning. Troponin leak likely due to demand ischemia. EKGs performed remained stable from previous EKGs, with no ST elevations or arrythmias. No events on telemetry. -Continue monitoring on telemetry.  -Will send a repeat Troponin with morning labs to trend  -Continue home Dilitiazem -HTN is controlled. Continue home Metoprolol. Restart Lisinopril tomorrow if creatinine continues to improve as below. 3. Heme: -Sickle Cell Anemia with crisis: Continue high dose Dilaudid PCA. Continue MS contin for long acting. Start Toradol tomorrow if pain continues to be an issue and his creatinine is back to baseline.  -Anemia: Transfused 2 units PRBC for symptomatic anemia and a Hgb of 6.8. Hgb improved to 8.4. Goal 8-9. Transfuse again if Hgb <8 due to his respiratory issues. -Continue his routine sickle cell care with folic acid and hydroxyurea.   4. Leukocytosis: Likely secondary to crisis. Will monitor as he has no signs of infection. 5. Increase in creatinine: creatinine for him is normally 0.4-0.5, yesterday it was at 0.7, that has slightly improved to 0.65 with IV fluids. Will continue IV fluids as below and monitor. Consider  restarting Lisinopril and start Toradol if creatinine normalizes.   6. FEN/GI :D5/0.45% @100cc /hr. Continue KDur for h/o hypokalemia and vitD for his vitD deficiency. Pt is upset at having a heart healthy diet. Will switch him to a regular diet.  Bowel regimen in place  -Once off Heparin drip, will have him transferred to floor status on telemetry.  Code Status: Full code DVT Prophylaxis: on Heparin gtt-->Lovenox Family Communication: none Disposition Plan: pending improvement.  Time spent: >55 min  Pt was initially seen and evaluated at 10:15am and re-evaluated again at 2:00pm. Kalman Shan  Pager [phone]. If 7PM-7AM, please contact night-coverage.  07/21/2014, 10:44 AM  LOS: 1 day   Kalman Shan

## 2014-07-21 NOTE — Progress Notes (Signed)
PHARMACY - HEPARIN (brief follow up note)  Patient on IV heparin for acute PE (pt on Xarelto PTA for h/o PE)  IV heparin currently infusing @ 1200 units/hr APTT, Heparin Level & INR originally ordered for 23:00, however delayed until 02:00 due to blood transfusion  Labs: APTT > 200 Heparin Level = 1.35 INR = 1.4  Spoke with RN regarding aPTT and it was noted the lab was drawn from the central line.  APTT redrawn from a peripheral site @ 0400 = 44 sec (Goal 66-102 sec)  Plan:  Increase heparin to 1400 units/hr           Check aPTT and heparin level 6 hr after rate increase  Leone Haven, PharmD 07/21/14 @ 04:48

## 2014-07-21 NOTE — Progress Notes (Signed)
[id number]/discount drug card and discount lovenox card given to patient with instruction on how to use at the pharmacy./Rhonda Davis,RN,BSN,CCM

## 2014-07-21 NOTE — Progress Notes (Addendum)
ANTICOAGULATION CONSULT NOTE - Follow Up Consult  Pharmacy Consult for heparin, warfarin Indication: acute PE  No Known Allergies  Patient Measurements: Weight: 162 lb 7.7 oz (73.7 kg) Heparin Dosing Weight: 73.5 kg  Vital Signs: Temp: 98.5 F (36.9 C) (06/20 1213) Temp Source: Oral (06/20 0800) BP: 116/72 mmHg (06/20 1213) Pulse Rate: 84 (06/20 1213)  Labs:  Recent Labs  07/20/14 1023 07/20/14 1630 07/21/14 0208 07/21/14 0403 07/21/14 0905 07/21/14 1130  HGB 6.8*  --  8.4*  --   --   --   HCT 19.8*  --  24.8*  --   --   --   PLT 583*  --  489*  --   --   --   APTT  --   --  >200* 44*  --  46*  LABPROT  --   --  17.3*  --   --   --   INR  --   --  1.40  --   --   --   HEPARINUNFRC  --   --  1.35*  --   --  <0.10*  CREATININE 0.71  --  0.65  --   --   --   TROPONINI 0.67* 4.60* 5.11*  --  3.66*  --     Estimated Creatinine Clearance: 134.3 mL/min (by C-G formula based on Cr of 0.65).   Medications:  Prescriptions prior to admission  Medication Sig Dispense Refill Last Dose  . folic acid (FOLVITE) 1 MG tablet Take 1 tablet (1 mg total) by mouth every morning. 30 tablet 11 07/19/2014 at Unknown time  . HYDROmorphone (DILAUDID) 4 MG tablet Take 1 tablet (4 mg total) by mouth every 4 (four) hours as needed for severe pain. 90 tablet 0 07/19/2014 at Unknown time  . hydroxyurea (HYDREA) 500 MG capsule Take 3 capsules (1,500 mg total) by mouth daily. May take with food to minimize GI side effects. 60 capsule 3 07/19/2014 at Unknown time  . lisinopril (PRINIVIL,ZESTRIL) 10 MG tablet Take 1 tablet (10 mg total) by mouth daily. 30 tablet 1 07/19/2014 at Unknown time  . metoprolol tartrate (LOPRESSOR) 25 MG tablet Take 1 tablet (25 mg total) by mouth daily. 30 tablet 6 07/19/2014 at am  . morphine (MS CONTIN) 30 MG 12 hr tablet Take 1 tablet (30 mg total) by mouth every 12 (twelve) hours. 60 tablet 0 07/19/2014 at Unknown time  . potassium chloride SA (K-DUR,KLOR-CON) 20 MEQ tablet  Take 1 tablet (20 mEq total) by mouth every morning. 30 tablet 3 07/19/2014 at Unknown time  . rivaroxaban (XARELTO) 20 MG TABS tablet Take 1 tablet (20 mg total) by mouth every morning. 30 tablet 2 07/19/2014 at Unknown time  . zolpidem (AMBIEN) 10 MG tablet Take 1 tablet (10 mg total) by mouth at bedtime as needed for sleep. 30 tablet 0 Past Week at Unknown time  . Cholecalciferol (VITAMIN D) 2000 UNITS tablet Take 1 tablet (2,000 Units total) by mouth daily. (Patient not taking: Reported on 07/07/2014) 90 tablet 3 Not Taking  . diltiazem (CARDIZEM CD) 120 MG 24 hr capsule Take 1 capsule (120 mg total) by mouth daily. (Patient not taking: Reported on 07/20/2014) 30 capsule 1 Not Taking at Unknown time  . metroNIDAZOLE (FLAGYL) 500 MG tablet Take 1 tablet (500 mg total) by mouth every 8 (eight) hours. (Patient not taking: Reported on 07/07/2014) 15 tablet 0 Not Taking   Scheduled:  . cholecalciferol  2,000 Units Oral Daily  . diltiazem  120 mg Oral Daily  . folic acid  1 mg Oral q morning - 10a  . HYDROmorphone PCA 2 mg/mL   Intravenous 6 times per day  . hydroxyurea  1,500 mg Oral Daily  . metoprolol tartrate  25 mg Oral Daily  . morphine  30 mg Oral Q12H  . potassium chloride SA  20 mEq Oral q morning - 10a  . senna-docusate  1 tablet Oral QHS  . Warfarin - Pharmacist Dosing Inpatient   Does not apply q1800   Infusions:  . dextrose 5 % and 0.45% NaCl 100 mL/hr at 07/21/14 1200  . 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 Presents with shortness of breath, CTA chest reveals BL PE. He states he is compliant with his xarelto (last dose 6/18 at 0930a). Pharmacy ask to dose heparin gtt for acute PE; Xarelto placed on hold.  Note unable to get baseline heparin level and aPTT as heparin was started by EDP.  To transition to warfarin per pharmacy with continued heparin bridging  Today, 07/21/2014:  Heparin level, aPTT  subtherapeutic on 1400 units/hr.  Xarelto effect on HL appears to have dissipated.  Hgb 8.4 following PRBC x2 in setting of sickle cell anemia; Plt sl elevated  Renal function is WNL, CrCl > 189ml/min  No bleeding or line issues per RN  No interacting medications noted  Goal of Therapy:  Heparin level 0.3-0.7 units/ml aPTT 66-102 seconds Monitor platelets by anticoagulation protocol: Yes  Plan:   Warfarin 10 mg PO x 1  Heparin IV 2000 units bolus  Increase heparin IV to 1800 units/hr  Check heparin level 6 hrs after rate change  Will check aPTT one more time; if HL seems consistently unaffected by Xarelto, can transition to HL only.  Daily CBC, INR; daily HL once stable  Monitor for bleeding   Reuel Boom, PharmD Pager: [phone] 07/21/2014, 1:12 PM

## 2014-07-21 NOTE — Progress Notes (Signed)
Patient upset that his current diet is listed as "heart healthy". I explained to the patient that I am unable to change diet orders and that the MD wrote orders for that diet, possibly due to elevated Troponin levels. Patient remains angry about diet orders. Luther Parody, RN

## 2014-07-21 NOTE — Progress Notes (Signed)
Pharmacy - Lovenox dosing  Assessment:  (see full note below for more detail) 88 YOM with sickle cell and history of PE on xarelto. Experienced new PE (failed Xarelto), started on heparin IV.  Original plan today was to bridge to warfarin, but patient does not want to have periodic INR monitoring.  Will switch to Lovenox per pharmacy.     Received 1st and only dose of warfarin (10 mg) this AM  Last heparin level subtherapeutic but received heparin bolus of 2000 units at 1330 today (1.5 hrs ago)  Hgb 8.4 following PRBC x2 in setting of sickle cell anemia; Plt sl elevated  Renal function is WNL, CrCl > 164ml/min  No bleeding or line issues per RN   Plan:  Stop heparin drip  Begin Lovenox 75 mg (1 mg/kg) SQ q12 hr.  Will start 2 hrs after stopping heparin drip (3.5 hrs after bolus)  CBC, SCr at least q72 hrs while on Lovenox  Monitor for signs of bleeding or thrombosis  Reuel Boom, PharmD Pager: [phone] 07/21/2014, 3:03 PM

## 2014-07-22 ENCOUNTER — Encounter (HOSPITAL_COMMUNITY): Payer: Self-pay | Admitting: Internal Medicine

## 2014-07-22 DIAGNOSIS — D5700 Hb-SS disease with crisis, unspecified: Secondary | ICD-10-CM

## 2014-07-22 DIAGNOSIS — R0902 Hypoxemia: Secondary | ICD-10-CM

## 2014-07-22 DIAGNOSIS — R7989 Other specified abnormal findings of blood chemistry: Secondary | ICD-10-CM

## 2014-07-22 DIAGNOSIS — I4949 Other premature depolarization: Secondary | ICD-10-CM

## 2014-07-22 LAB — CBC WITH DIFFERENTIAL/PLATELET
Basophils Absolute: 0 10*3/uL (ref 0.0–0.1)
Basophils Relative: 0 % (ref 0–1)
EOS ABS: 0.4 10*3/uL (ref 0.0–0.7)
Eosinophils Relative: 2 % (ref 0–5)
HCT: 23.2 % — ABNORMAL LOW (ref 39.0–52.0)
HEMOGLOBIN: 7.9 g/dL — AB (ref 13.0–17.0)
Lymphocytes Relative: 8 % — ABNORMAL LOW (ref 12–46)
Lymphs Abs: 1.5 10*3/uL (ref 0.7–4.0)
MCH: 30.5 pg (ref 26.0–34.0)
MCHC: 34.1 g/dL (ref 30.0–36.0)
MCV: 89.6 fL (ref 78.0–100.0)
MONOS PCT: 12 % (ref 3–12)
Monocytes Absolute: 2 10*3/uL — ABNORMAL HIGH (ref 0.1–1.0)
NEUTROS PCT: 78 % — AB (ref 43–77)
Neutro Abs: 13.6 10*3/uL — ABNORMAL HIGH (ref 1.7–7.7)
Platelets: 423 10*3/uL — ABNORMAL HIGH (ref 150–400)
RBC: 2.59 MIL/uL — ABNORMAL LOW (ref 4.22–5.81)
RDW: 19.9 % — AB (ref 11.5–15.5)
WBC: 17.5 10*3/uL — ABNORMAL HIGH (ref 4.0–10.5)

## 2014-07-22 LAB — BASIC METABOLIC PANEL
ANION GAP: 7 (ref 5–15)
BUN: 6 mg/dL (ref 6–20)
CO2: 22 mmol/L (ref 22–32)
CREATININE: 0.53 mg/dL — AB (ref 0.61–1.24)
Calcium: 8.4 mg/dL — ABNORMAL LOW (ref 8.9–10.3)
Chloride: 105 mmol/L (ref 101–111)
Glucose, Bld: 119 mg/dL — ABNORMAL HIGH (ref 65–99)
Potassium: 3.4 mmol/L — ABNORMAL LOW (ref 3.5–5.1)
Sodium: 134 mmol/L — ABNORMAL LOW (ref 135–145)

## 2014-07-22 LAB — RETICULOCYTES
RBC.: 2.59 MIL/uL — AB (ref 4.22–5.81)
Retic Count, Absolute: 450.7 10*3/uL — ABNORMAL HIGH (ref 19.0–186.0)
Retic Ct Pct: 17.4 % — ABNORMAL HIGH (ref 0.4–3.1)

## 2014-07-22 LAB — PROTIME-INR
INR: 1.44 (ref 0.00–1.49)
Prothrombin Time: 17.6 seconds — ABNORMAL HIGH (ref 11.6–15.2)

## 2014-07-22 LAB — TROPONIN I: TROPONIN I: 2.31 ng/mL — AB (ref <0.031)

## 2014-07-22 LAB — LACTATE DEHYDROGENASE: LDH: 424 U/L — ABNORMAL HIGH (ref 98–192)

## 2014-07-22 LAB — APTT: APTT: 39 s — AB (ref 24–37)

## 2014-07-22 MED ORDER — SODIUM CHLORIDE 0.9 % IJ SOLN
10.0000 mL | INTRAMUSCULAR | Status: DC | PRN
  Administered 2014-07-22 – 2014-07-24 (×3): 10 mL
  Filled 2014-07-22 (×3): qty 40

## 2014-07-22 MED ORDER — ASPIRIN 81 MG PO CHEW
81.0000 mg | CHEWABLE_TABLET | Freq: Every day | ORAL | Status: DC
  Administered 2014-07-22 – 2014-07-24 (×3): 81 mg via ORAL
  Filled 2014-07-22 (×3): qty 1

## 2014-07-22 NOTE — Progress Notes (Signed)
Pt oxygen sats were 87% on RA while laying in bed.  Encouraged Incentive spirometry and was able to achieve 2150 several times, but oxygen stayed 86-88%.  Reapplied oxygen 1-2 liters to finally achieve 90% oxygen sat.  Pt alert, no apparent SOB.

## 2014-07-22 NOTE — Progress Notes (Signed)
Pt oxygen sat was 92-95% on RA,  He then ambulated to bathroom and a short walk in the hall. Sats remained at 90% on RA.  No complaints or SOB evident.

## 2014-07-22 NOTE — Progress Notes (Signed)
SICKLE CELL SERVICE PROGRESS NOTE  [name] DZH:[id number] DOB: 19-Nov-1980 DOA: 07/20/2014 PCP: Deyvi Bonanno A., MD  Assessment/Plan: Principal Problem:   Pulmonary embolism Active Problems:   Chronic anticoagulation   Anemia   Pulmonary embolus   Hb-SS disease with crisis  1. Pulmonary Embolus: Pt has had another PE on this admission all while maintaining his compliance with Xarelto. Dr. Alben Deeds spoke with Dr. Benay Spice ([facility]) who recommended Lovenox. The patient has been started on Lovenox and verbalizes his ability to administer Lovenox. However will place formal consult to Nursing and Pharmacy for teaching. Pt will likely benefit from a referral to coagulation clinic for recurrent PE while anticoagulated. 2. Hypoxia: Pt has Pulmonary Arterial HTN. This could be a cause of PVC's. He is currently under careof Dr. Felisa Bonier at [facility]. He was hypoxic during sleep last night requiring increase of Oxygen up to 4L/min. Will obtain overnight pulse oximetry. Will also need follow up ECHO when not in crisis. 3. PVC's (Multifocal and Bigeminy): Hb at baseline. Potassium marginally low. Will check Mg and replace potassium orally. However patient had elevated troponin which likely represented a demand ischemia.  However, although Troponin is trending down, the PVC's are concerning. Will ask Cardiology to see in consultation. Will defer to Cardiology on 12 lead EKG as EKG's yesterday was unchanged from the previous EKG. 4. Hb SS with crisis: Pt reports pain in ribs and back now 6/10. He has used 27.2 mg of Dilaudid with 27/27: demands/deliveries. We have agreed to schedule oral dilaudid and continue PCA as a PRN tool. Start Toradol to augment pain management. 5. Leukocytosis: No evidence of infection. Will continue to  Monitor. 6. Anemia: Pt has anemia of chronic disease and has been transfused 2 units of RBC's. He continues to have Hypoxia and PVC's despite current Hb. Will check Telemetry for  recent cardiac electrical activity.   Code Status: Full Code Family Communication: N/A Disposition Plan: Not yet ready for discharge  [place].  Pager [phone]. If 7PM-7AM, please contact night-coverage.  07/22/2014, 2:14 PM  LOS: 2 days   Consultants:  None  Procedures:  None  Antibiotics:  None  HPI/Subjective: Pt reports pain in ribs and back now 6/10.   Objective: Filed Vitals:   07/22/14 0613 07/22/14 0800 07/22/14 1003 07/22/14 1159  BP: 117/81  123/94   Pulse: 72  67   Temp:   98.1 F (36.7 C)   TempSrc:   Oral   Resp: 14 14 16 18   Height:      Weight:      SpO2: 99% 97% 97% 95%   Weight change: 0 lb (0 kg)  Intake/Output Summary (Last 24 hours) at 07/22/14 1414 Last data filed at 07/22/14 1225  Gross per 24 hour  Intake   1718 ml  Output   2800 ml  Net  -1082 ml    General: Alert, awake, oriented x3, in no acute distress.  HEENT: [place]/AT PEERL, EOMI, mild icterus Neck: Trachea midline,  no masses, no thyromegal,y no JVD, no carotid bruit OROPHARYNX:  Moist, No exudate/ erythema/lesions.  Heart: Regular rate and rhythm, without murmurs, rubs, gallops, PMI non-displaced, no heaves or thrills on palpation.  Lungs: Clear to auscultation, no wheezing or rhonchi noted. No increased vocal fremitus resonant to percussion  Abdomen: Soft, nontender, nondistended, positive bowel sounds, no masses no hepatosplenomegaly noted..  Neuro: No focal neurological deficits noted cranial nerves II through XII grossly intact. Strength at baseline in bilateral upper and lower extremities. Musculoskeletal:  No warm swelling or erythema around joints, no spinal tenderness noted. vPsychiatric: Patient alert and oriented x3, good insight and cognition, good recent to remote recall.    Data Reviewed: Basic Metabolic Panel:  Recent Labs Lab 07/20/14 1023 07/21/14 0208 07/22/14 0452  NA 140 137 134*  K 3.6 3.9 3.4*  CL 112* 110 105  CO2 21* 22 22  GLUCOSE  117* 125* 119*  BUN 11 8 6   CREATININE 0.71 0.65 0.53*  CALCIUM 8.6* 8.6* 8.4*   Liver Function Tests:  Recent Labs Lab 07/20/14 1023  AST 35  ALT 17  ALKPHOS 82  BILITOT 7.0*  PROT 7.5  ALBUMIN 3.9   No results for input(s): LIPASE, AMYLASE in the last 168 hours. No results for input(s): AMMONIA in the last 168 hours. CBC:  Recent Labs Lab 07/20/14 1023 07/21/14 0208 07/22/14 0452  WBC 16.8* 15.7* 17.5*  NEUTROABS 13.3* 10.8* 13.6*  HGB 6.8* 8.4* 7.9*  HCT 19.8* 24.8* 23.2*  MCV 94.3 90.8 89.6  PLT 583* 489* 423*   Cardiac Enzymes:  Recent Labs Lab 07/20/14 1023 07/20/14 1630 07/21/14 0208 07/21/14 0905 07/22/14 0452  TROPONINI 0.67* 4.60* 5.11* 3.66* 2.31*   BNP (last 3 results) No results for input(s): BNP in the last 8760 hours.  ProBNP (last 3 results)  Recent Labs  01/01/14 0909  PROBNP 5790.0*    CBG: No results for input(s): GLUCAP in the last 168 hours.  Recent Results (from the past 240 hour(s))  MRSA PCR Screening     Status: None   Collection Time: 07/21/14  8:41 AM  Result Value Ref Range Status   MRSA by PCR NEGATIVE NEGATIVE Final    Comment:        The GeneXpert MRSA Assay (FDA approved for NASAL specimens only), is one component of a comprehensive MRSA colonization surveillance program. It is not intended to diagnose MRSA infection nor to guide or monitor treatment for MRSA infections.      Studies: Dg Chest 2 View  07/20/2014   CLINICAL DATA:  Chest pain for 1 day.  Sickle cell.  EXAM: CHEST  2 VIEW  COMPARISON:  07/14/2014  FINDINGS: Prominent cardiomegaly. Left subclavian Port-A-Cath device with its tip at the cavoatrial junction is stable. Interstitial changes towards lung bases are stable. No pneumothorax. No pleural effusion. No consolidation  IMPRESSION: Cardiomegaly and chronic basilar interstitial changes.   Electronically Signed   By: Marybelle Killings M.D.   On: 07/20/2014 10:12   Dg Chest 2 View  07/14/2014    CLINICAL DATA:  Sickle cell pain crisis. Mid to left upper back pain. Weakness. Started several hours ago.  EXAM: CHEST  2 VIEW  COMPARISON:  06/25/2014  FINDINGS: Cardiac enlargement with patchy interstitial changes in the lungs similar to previous study. No focal consolidation or airspace disease. No blunting of costophrenic angles. No pneumothorax. Power port type central venous catheter with tip over the cavoatrial junction.  IMPRESSION: Cardiac enlargement with stable patchy interstitial changes in the lungs. No acute infiltration.   Electronically Signed   By: Lucienne Capers M.D.   On: 07/14/2014 06:09   Dg Chest 2 View  06/25/2014   CLINICAL DATA:  Sickle cell crisis, chest pain.  EXAM: CHEST  2 VIEW  COMPARISON:  Chest radiograph Jun 23, 2014  FINDINGS: Moderate cardiomegaly, unchanged from prior examination. Mediastinal silhouette is nonsuspicious. Diffuse slightly increasing interstitial prominence without pleural effusion or focal consolidation. No pneumothorax. 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 Single lumen LEFT chest Port-A-Cath with distal tip at cavoatrial junction.  IMPRESSION: Similar cardiomegaly. Mildly increasing interstitial prominence (atypical infection or pulmonary edema) without focal consolidation.   Electronically Signed   By: Elon Alas M.D.   On: 06/25/2014 04:17   Dg Chest 2 View  06/24/2014   CLINICAL DATA:  Left flank pain, weakness, shortness of breath for 1 day, history of sickle cell disease  EXAM: CHEST  2 VIEW  COMPARISON:  Chest x-ray of 06/03/2014  FINDINGS: There is little change in cardiomegaly with minimal fullness of the perihilar vasculature. No frank congestive heart failure seen and no effusion is noted. Left Port-A-Cath is present with the tip seen to the expected SVC -RA junction. No bony abnormality is noted.  IMPRESSION: No significant change in cardiomegaly and perhaps minimal pulmonary vascular  congestion. No effusion   Electronically Signed   By: Ivar Drape M.D.   On: 06/24/2014 08:26   Ct Angio Chest Pe W/cm &/or Wo Cm  07/20/2014   CLINICAL DATA:  Chest pain, sickle cell crisis  EXAM: CT ANGIOGRAPHY CHEST WITH CONTRAST  TECHNIQUE: Multidetector CT imaging of the chest was performed using the standard protocol during bolus administration of intravenous contrast. Multiplanar CT image reconstructions and MIPs were obtained to evaluate the vascular anatomy.  CONTRAST:  144mL OMNIPAQUE IOHEXOL 350 MG/ML SOLN  COMPARISON:  07/20/2014 chest radiograph, chest CT 03/19/2014  FINDINGS: Mediastinum/Nodes: Left-sided Port-A-Cath in place with tip in the right atrium. Cardiomegaly noted. Right atrial prominence. No pericardial effusion. AP window lymphadenopathy persists, largest 1.0 cm image 32. Representative pretracheal node measures 0.7 cm image 37.  Pulmonary arterial dilation is reidentified measuring 4.0 cm maximal transverse diameter image 121 series 6. The examination is adequate for evaluation for acute pulmonary embolism up to and including the 3rd order pulmonary arteries. Multiple bilateral lower lobe filling defects are identified compatible with pulmonary embolism, for example image 178 series 6. Allowing for motion artifact, there is no evidence for right heart strain as by calculated RV: LV ratio of 0.8.  Lungs/Pleura: Areas of mosaic type per fusion are identified bilaterally, likely related to pulmonary emboli. Curvilinear bilateral lower lobe scarring or atelectasis is noted.  Upper abdomen: Normal  Musculoskeletal: Inhomogeneous trabecular pattern of the vertebral bodies is compatible with sickle cell deformity. No acute osseous abnormality.  Review of the MIP images confirms the above findings.  IMPRESSION: Small to moderate overall clot burden with bilateral predominantly lower lobe pulmonary emboli. No CT evidence for right heart strain.  Cardiomegaly.  Mosaic type perfusion pattern  within the lung parenchyma compatible with pulmonary emboli.  Critical Value/emergent results were called by telephone at the time of interpretation on 07/20/2014 at 11:58 am to Dr. Milton Ferguson , who verbally acknowledged these results.   Electronically Signed   By: Conchita Paris M.D.   On: 07/20/2014 12:00    Scheduled Meds: . cholecalciferol  2,000 Units Oral Daily  . diltiazem  120 mg Oral Daily  . enoxaparin (LOVENOX) injection  75 mg Subcutaneous BID  . folic acid  1 mg Oral q morning - 10a  . HYDROmorphone PCA 2 mg/mL   Intravenous 6 times per day  . hydroxyurea  1,500 mg Oral Daily  . metoprolol tartrate  25 mg Oral Daily  . morphine  30 mg Oral Q12H  . potassium chloride SA  20 mEq Oral q morning - 10a  . senna-docusate  1 tablet Oral QHS   Continuous Infusions: .  dextrose 5 % and 0.45% NaCl 100 mL/hr at 07/22/14 1045    Time spent 40 minutes

## 2014-07-23 ENCOUNTER — Inpatient Hospital Stay (HOSPITAL_COMMUNITY): Payer: Medicare Other

## 2014-07-23 DIAGNOSIS — Z7901 Long term (current) use of anticoagulants: Secondary | ICD-10-CM

## 2014-07-23 DIAGNOSIS — I2609 Other pulmonary embolism with acute cor pulmonale: Principal | ICD-10-CM

## 2014-07-23 DIAGNOSIS — D599 Acquired hemolytic anemia, unspecified: Secondary | ICD-10-CM

## 2014-07-23 DIAGNOSIS — I493 Ventricular premature depolarization: Secondary | ICD-10-CM

## 2014-07-23 DIAGNOSIS — R079 Chest pain, unspecified: Secondary | ICD-10-CM

## 2014-07-23 DIAGNOSIS — I272 Other secondary pulmonary hypertension: Secondary | ICD-10-CM

## 2014-07-23 LAB — BASIC METABOLIC PANEL
Anion gap: 6 (ref 5–15)
BUN: 7 mg/dL (ref 6–20)
CALCIUM: 8.7 mg/dL — AB (ref 8.9–10.3)
CO2: 23 mmol/L (ref 22–32)
Chloride: 107 mmol/L (ref 101–111)
Creatinine, Ser: 0.53 mg/dL — ABNORMAL LOW (ref 0.61–1.24)
GFR calc Af Amer: >60 mL/min (ref >60)
GLUCOSE: 102 mg/dL — AB (ref 65–99)
POTASSIUM: 4.2 mmol/L (ref 3.5–5.1)
Sodium: 136 mmol/L (ref 135–145)

## 2014-07-23 LAB — MAGNESIUM: Magnesium: 1.7 mg/dL (ref 1.7–2.4)

## 2014-07-23 LAB — CBC WITH DIFFERENTIAL/PLATELET
BASOS PCT: 0 % (ref 0–1)
Basophils Absolute: 0.1 10*3/uL (ref 0.0–0.1)
Eosinophils Absolute: 0.7 10*3/uL (ref 0.0–0.7)
Eosinophils Relative: 5 % (ref 0–5)
HCT: 24.6 % — ABNORMAL LOW (ref 39.0–52.0)
HEMOGLOBIN: 8.3 g/dL — AB (ref 13.0–17.0)
Lymphocytes Relative: 15 % (ref 12–46)
Lymphs Abs: 2.1 10*3/uL (ref 0.7–4.0)
MCH: 29.6 pg (ref 26.0–34.0)
MCHC: 33.7 g/dL (ref 30.0–36.0)
MCV: 87.9 fL (ref 78.0–100.0)
MONOS PCT: 14 % — AB (ref 3–12)
Monocytes Absolute: 2 10*3/uL — ABNORMAL HIGH (ref 0.1–1.0)
NEUTROS PCT: 66 % (ref 43–77)
Neutro Abs: 9.6 10*3/uL — ABNORMAL HIGH (ref 1.7–7.7)
Platelets: 435 10*3/uL — ABNORMAL HIGH (ref 150–400)
RBC: 2.8 MIL/uL — ABNORMAL LOW (ref 4.22–5.81)
RDW: 19.9 % — ABNORMAL HIGH (ref 11.5–15.5)
WBC: 14.4 10*3/uL — ABNORMAL HIGH (ref 4.0–10.5)

## 2014-07-23 MED ORDER — ENOXAPARIN SODIUM 150 MG/ML ~~LOC~~ SOLN: 110.0000 mg | SUBCUTANEOUS | Status: DC

## 2014-07-23 MED ORDER — ENOXAPARIN SODIUM 150 MG/ML ~~LOC~~ SOLN: 110.0000 mg | Freq: Two times a day (BID) | SUBCUTANEOUS | Status: DC

## 2014-07-23 MED ORDER — ASPIRIN 81 MG PO CHEW: 81.0000 mg | CHEWABLE_TABLET | Freq: Every day | ORAL | Status: DC

## 2014-07-23 MED ORDER — ENOXAPARIN SODIUM 150 MG/ML ~~LOC~~ SOLN: 75.0000 mg | Freq: Two times a day (BID) | SUBCUTANEOUS | Status: DC

## 2014-07-23 NOTE — Consult Note (Signed)
Patient ID: [name] MRN: [id number], DOB/AGE: [birth year]   Admit date: 07/20/2014   Primary Physician: [name]., MD Primary Cardiologist: New  Pt. Profile:  34 y/o AAM with sickle cell disease and h/o PE, admitted for chest pain in the setting of recurrent PE 2/2 medication non compliance with Xarelto. Frequent PVCs noted on telemetry.   Problem List  Past Medical History  Diagnosis Date  . Sickle cell anemia   . Blood transfusion   . Acute embolism and thrombosis of right internal jugular vein   . Hypokalemia   . Mood disorder   . History of pulmonary embolus (PE)   . Avascular necrosis   . Leukocytosis     Chronic  . Thrombocytosis     Chronic  . Hypertension   . History of Clostridium difficile infection   . Uses marijuana   . Chronic anticoagulation   . Functional asplenia   . Former smoker   . Second hand tobacco smoke exposure   . Alcohol consumption of one to four drinks per day   . Noncompliance with medication regimen   . Sickle-cell crisis with associated acute chest syndrome 05/13/2013  . Acute chest syndrome 06/18/2013  . Demand ischemia 01/02/2014    Past Surgical History  Procedure Laterality Date  . Right hip replacement      08/2006  . Cholecystectomy      01/2008  . Porta cath placement    . Porta cath removal    . Umbilical hernia repair      01/2008  . Excision of left periauricular cyst      10/2009  . Excision of right ear lobe cyst with primary closur      11/2007  . Portacath placement  01/05/2012    Procedure: INSERTION PORT-A-CATH;  Surgeon: Odis Hollingshead, MD;  Location: [facility];  Service: General;  Laterality: N/A;  ultrasound guiced port a cath insertion with fluoroscopy     Allergies  No Known Allergies  HPI  34 y/o AAM a history of sickle cell disease and recurrent PEs, in the setting of medical noncompliance with Xarelto. He was admitted 03/2014 with chest pain and dyspnea and was found to have a PE. 2D  echo at that time showed normal LVF with an EF of 50-55% and normal wall motion. The RV was mildly dilated and systolic function was moderately reduced. PA pressure was moderate to severely increased at 76 mm Hg. He is followed in the [facility] by Dr. Rodena Piety. His other medical problems include HTN. He denies h/o DM, HLD and tobacco abuse. He notes family h/o CAD with MI in his paternal grandfather. No known coronary disease or h/o SCD in first degree relatives.   Patient initially presented to ER 07/14/14 with chest pain. There was concern for PE and admission was recommended, but patient left AMA. He returned 6/19 with worsening CP and dyspnea. He was also hypoxic with O2 sats down to 85%. Chest CT showed a small to moderate overall clot burden with bilateral predominantly lower lobe pulmonary emboli. IV heparin was initiated. IV dilaudid ordered for pain. Hospital course also complicated by anemia. Hgb on admit was 6.8. He is s/p transfusion with 2 units of PRBCs. Hgb trend 6.8-->8.4-->7.9. Cardiac enzymes were also cycled x 3 and were elevated but now with downward trend 5.11-->3.66-->2.31. He has also been observed to have frequent PVCs/ bigeminy on telemety. K was mildly low yesterday at 3.4. He received supplementation. F/u  BMP pending.   He notes improvement in symptoms with treatment of his PE. He notes occasional palpitations 91-2 beats at a time, but no prolonged episodes. Does not occur frequently. He denies dizziness, syncope/ near syncope. Telemetry now shows NSR with a few isolated PVCs. HR in the 70s. Asymptomatic currently. He is on both Cardizem and metoprolol.     Home Medications  Prior to Admission medications   Medication Sig Start Date End Date Taking? Authorizing Provider  folic acid (FOLVITE) 1 MG tablet Take 1 tablet (1 mg total) by mouth every morning. 10/08/13  Yes [name], MD  HYDROmorphone (DILAUDID) 4 MG tablet Take 1 tablet (4 mg total) by mouth every 4  (four) hours as needed for severe pain. 07/07/14  Yes Dorena Dew, FNP  hydroxyurea (HYDREA) 500 MG capsule Take 3 capsules (1,500 mg total) by mouth daily. May take with food to minimize GI side effects. 10/30/13  Yes [name], MD  lisinopril (PRINIVIL,ZESTRIL) 10 MG tablet Take 1 tablet (10 mg total) by mouth daily. 01/22/14  Yes [name], MD  metoprolol tartrate (LOPRESSOR) 25 MG tablet Take 1 tablet (25 mg total) by mouth daily. 08/22/13  Yes [name], MD  morphine (MS CONTIN) 30 MG 12 hr tablet Take 1 tablet (30 mg total) by mouth every 12 (twelve) hours. 07/07/14  Yes Dorena Dew, FNP  potassium chloride SA (K-DUR,KLOR-CON) 20 MEQ tablet Take 1 tablet (20 mEq total) by mouth every morning. 06/09/14  Yes [name], MD  rivaroxaban (XARELTO) 20 MG TABS tablet Take 1 tablet (20 mg total) by mouth every morning. 03/13/14  Yes Costin Karlyne Greenspan, MD  zolpidem (AMBIEN) 10 MG tablet Take 1 tablet (10 mg total) by mouth at bedtime as needed for sleep. 07/09/14  Yes Dorena Dew, FNP  Cholecalciferol (VITAMIN D) 2000 UNITS tablet Take 1 tablet (2,000 Units total) by mouth daily. Patient not taking: Reported on 07/07/2014 02/13/14   [name], MD  diltiazem (CARDIZEM CD) 120 MG 24 hr capsule Take 1 capsule (120 mg total) by mouth daily. Patient not taking: Reported on 07/20/2014 03/13/14   Caren Griffins, MD  metroNIDAZOLE (FLAGYL) 500 MG tablet Take 1 tablet (500 mg total) by mouth every 8 (eight) hours. Patient not taking: Reported on 07/07/2014 07/01/14   [name], MD    Family History  Family History  Problem Relation Age of Onset  . Sickle cell trait Mother   . Depression Mother   . Diabetes Mother   . Sickle cell trait Father   . Sickle cell trait Brother     Social History  History   Social History  . Marital Status: Single    Spouse Name: N/A  . Number of Children: 0  . Years of Education: 13   Occupational History    . 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 Smoking status: Former Smoker -- 13 years    Quit date: 07/08/2010  . Smokeless tobacco: Never Used  . Alcohol Use: No  . Drug Use: 2.00 per week    Special: Marijuana  . Sexual Activity:    Partners: Female    Museum/gallery curator: None     Comment: month ago   Other Topics Concern  . Not on file   Social History Narrative   Lives in an apartment.  Single.  Lives  alone but has a girlfriend that helps care for him.  Does not use any assist devices.        [name]:  [phone] Mom, emergency contact     Review of Systems General:  No chills, fever, night sweats or weight changes.  Cardiovascular:  No chest pain, dyspnea on exertion, edema, orthopnea, palpitations, paroxysmal nocturnal dyspnea. Dermatological: No rash, lesions/masses Respiratory: No cough, dyspnea Urologic: No hematuria, dysuria Abdominal:   No nausea, vomiting, diarrhea, bright red blood per rectum, melena, or hematemesis Neurologic:  No visual changes, wkns, changes in mental status. All other systems reviewed and are otherwise negative except as noted above.  Physical Exam  Blood pressure 101/61, pulse 72, temperature 98 F (36.7 C), temperature source Oral, resp. rate 14, height 6' (1.829 m), weight 162 lb 7.7 oz (73.7 kg), SpO2 96 %.  General: Pleasant, NAD Psych: Normal affect. Neuro: Alert and oriented X 3. Moves all extremities spontaneously. HEENT: Normal  Neck: Supple without bruits or JVD. Lungs:  Resp regular and unlabored, CTA. Heart: RRR 1/6 SM at LUSB murmurs. Abdomen: Soft, non-tender, non-distended, BS + x 4.  Extremities: No clubbing, cyanosis or edema. DP/PT/Radials 2+ and equal bilaterally.  Labs  Troponin (Point of Care Test) No results for input(s): TROPIPOC in the last 72 hours.  Recent Labs  07/20/14 1630 07/21/14 0208 07/21/14 0905 07/22/14 0452  TROPONINI  4.60* 5.11* 3.66* 2.31*   Lab Results  Component Value Date   WBC 17.5* 07/22/2014   HGB 7.9* 07/22/2014   HCT 23.2* 07/22/2014   MCV 89.6 07/22/2014   PLT 423* 07/22/2014    Recent Labs Lab 07/20/14 1023  07/22/14 0452  NA 140  < > 134*  K 3.6  < > 3.4*  CL 112*  < > 105  CO2 21*  < > 22  BUN 11  < > 6  CREATININE 0.71  < > 0.53*  CALCIUM 8.6*  < > 8.4*  PROT 7.5  --   --   BILITOT 7.0*  --   --   ALKPHOS 82  --   --   ALT 17  --   --   AST 35  --   --   GLUCOSE 117*  < > 119*  < > = values in this interval not displayed. Lab Results  Component Value Date   CHOL 63 06/04/2013   HDL 24* 06/04/2013   LDLCALC 26 06/04/2013   TRIG 63 06/04/2013   Lab Results  Component Value Date   DDIMER 0.66* 12/24/2013     Radiology/Studies  Dg Chest 2 View  07/20/2014   CLINICAL DATA:  Chest pain for 1 day.  Sickle cell.  EXAM: CHEST  2 VIEW  COMPARISON:  07/14/2014  FINDINGS: Prominent cardiomegaly. Left subclavian Port-A-Cath device with its tip at the cavoatrial junction is stable. Interstitial changes towards lung bases are stable. No pneumothorax. No pleural effusion. No consolidation  IMPRESSION: Cardiomegaly and chronic basilar interstitial changes.   Electronically Signed   By: Marybelle Killings M.D.   On: 07/20/2014 10:12   Dg Chest 2 View  07/14/2014   CLINICAL DATA:  Sickle cell pain crisis. Mid to left upper back pain. Weakness. Started several hours ago.  EXAM: CHEST  2 VIEW  COMPARISON:  06/25/2014  FINDINGS: Cardiac enlargement with patchy interstitial changes in the lungs similar to previous study. No focal consolidation or airspace disease. No blunting of costophrenic angles. No pneumothorax. Power port type central venous catheter with  tip over the cavoatrial junction.  IMPRESSION: Cardiac enlargement with stable patchy interstitial changes in the lungs. No acute infiltration.   Electronically Signed   By: Lucienne Capers M.D.   On: 07/14/2014 06:09   Dg Chest 2  View  06/25/2014   CLINICAL DATA:  Sickle cell crisis, chest pain.  EXAM: CHEST  2 VIEW  COMPARISON:  Chest radiograph Jun 23, 2014  FINDINGS: Moderate cardiomegaly, unchanged from prior examination. Mediastinal silhouette is nonsuspicious. Diffuse slightly increasing interstitial prominence without pleural effusion or focal consolidation. No pneumothorax. 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 Single lumen LEFT chest Port-A-Cath with distal tip at cavoatrial junction.  IMPRESSION: Similar cardiomegaly. Mildly increasing interstitial prominence (atypical infection or pulmonary edema) without focal consolidation.   Electronically Signed   By: Elon Alas M.D.   On: 06/25/2014 04:17   Dg Chest 2 View  06/24/2014   CLINICAL DATA:  Left flank pain, weakness, shortness of breath for 1 day, history of sickle cell disease  EXAM: CHEST  2 VIEW  COMPARISON:  Chest x-ray of 06/03/2014  FINDINGS: There is little change in cardiomegaly with minimal fullness of the perihilar vasculature. No frank congestive heart failure seen and no effusion is noted. Left Port-A-Cath is present with the tip seen to the expected SVC -RA junction. No bony abnormality is noted.  IMPRESSION: No significant change in cardiomegaly and perhaps minimal pulmonary vascular congestion. No effusion   Electronically Signed   By: Ivar Drape M.D.   On: 06/24/2014 08:26   Ct Angio Chest Pe W/cm &/or Wo Cm  07/20/2014   CLINICAL DATA:  Chest pain, sickle cell crisis  EXAM: CT ANGIOGRAPHY CHEST WITH CONTRAST  TECHNIQUE: Multidetector CT imaging of the chest was performed using the standard protocol during bolus administration of intravenous contrast. Multiplanar CT image reconstructions and MIPs were obtained to evaluate the vascular anatomy.  CONTRAST:  184mL OMNIPAQUE IOHEXOL 350 MG/ML SOLN  COMPARISON:  07/20/2014 chest radiograph, chest CT 03/19/2014  FINDINGS: Mediastinum/Nodes: Left-sided  Port-A-Cath in place with tip in the right atrium. Cardiomegaly noted. Right atrial prominence. No pericardial effusion. AP window lymphadenopathy persists, largest 1.0 cm image 32. Representative pretracheal node measures 0.7 cm image 37.  Pulmonary arterial dilation is reidentified measuring 4.0 cm maximal transverse diameter image 121 series 6. The examination is adequate for evaluation for acute pulmonary embolism up to and including the 3rd order pulmonary arteries. Multiple bilateral lower lobe filling defects are identified compatible with pulmonary embolism, for example image 178 series 6. Allowing for motion artifact, there is no evidence for right heart strain as by calculated RV: LV ratio of 0.8.  Lungs/Pleura: Areas of mosaic type per fusion are identified bilaterally, likely related to pulmonary emboli. Curvilinear bilateral lower lobe scarring or atelectasis is noted.  Upper abdomen: Normal  Musculoskeletal: Inhomogeneous trabecular pattern of the vertebral bodies is compatible with sickle cell deformity. No acute osseous abnormality.  Review of the MIP images confirms the above findings.  IMPRESSION: Small to moderate overall clot burden with bilateral predominantly lower lobe pulmonary emboli. No CT evidence for right heart strain.  Cardiomegaly.  Mosaic type perfusion pattern within the lung parenchyma compatible with pulmonary emboli.  Critical Value/emergent results were called by telephone at the time of interpretation on 07/20/2014 at 11:58 am to Dr. Milton Ferguson , who verbally acknowledged these results.   Electronically Signed   By: Conchita Paris M.D.   On: 07/20/2014 12:00    ECG  07/20/14 - NSR with Lateral ST abnormalities  Telemetry - now NSR   ASSESSMENT AND PLAN  Principal Problem:   Pulmonary embolism Active Problems:   Chronic anticoagulation   Anemia   Pulmonary embolus   Hb-SS disease with crisis  1. PVCs: review of prior telemetry strips show occasional PVCs and  some bigeminy. In the setting of mild hypokalemia. He received supplemental K yesterday. Repeat BMP pending. Telemetry now shows NSR with only a few isolated PVCs. He is fairly asymptomatic. He notes only occasional skipped beats but noting significant. No dizziness, syncope/ near syncope. Continue home Cardizem and metoprolol. Keep K WNL. Recommend checking a 2D echo to assess LVF and valves (has 1/6 SM at LUSB).   2. Elevated Troponin: He did have significant troponin levels, however with downward trend (5.11-->3.66-->2.31). Also in the setting of PE, making demand ischemia very likely. His EKG from 6/19 did show some lateral ST abnormalities. His sympotoms have improved with treatment of his PE, however we may need to consider an exercise tolerance test as an outpatient, once he recovers, to risk stratify. Recommend rechecking an echo while here.    Signed, Lyda Jester, PA-C 07/23/2014, 7:49 AM  History and all data above reviewed.  Patient examined.  I agree with the findings as above.  He has chest pain as described above.  This is different than his usual sickle cell pain.   The patient exam reveals COR:RRR, positive S4  ,  Lungs: Clear  ,  Abd: Positive bowel sounds, no rebound no guarding, Ext No edema  .  All available labs, radiology testing, previous records reviewed. Agree with documented assessment and plan. Chest pain:  Elevated troponin probably secondary to PE or sickle cell.  I agree with repeating an echo.  His EKG is essentially unchanged from previous.  I would likely suggest an out patient Lexiscan Myoview after he has recovered from his acute events.  Prolonged QT:  This has been chronic.  Avoid QT prolonging drugs.  PVCs:  No symptoms related to this.  We will follow up likely with out patient monitors.    [name]  11:45 AM  07/23/2014

## 2014-07-23 NOTE — Progress Notes (Signed)
Echocardiogram 2D Echocardiogram has been performed.  [name] 07/23/2014, 3:33 PM

## 2014-07-23 NOTE — Care Management Note (Signed)
Case Management Note  Patient Details  Name: [name] MRN: [id number] Date of Birth: [birth year]  Subjective/Objective: Per admitting-checked patient's insurance:Patient has medicare A, & B effective 2008, also has medicaid effective 07/20/14. Patient will use [facility] for his meds..MD updated.                   Action/Plan:d/c home novfurther needs or orders.   Expected Discharge Date:                  Expected Discharge Plan:  Home/Self Care  In-House Referral:  NA  Discharge planning Services  CM Consult, Medication Assistance  Post Acute Care Choice:    Choice offered to:     DME Arranged:  N/A DME Agency:  NA  HH Arranged:  NA HH Agency:  NA  Status of Service:  Completed, signed off  Medicare Important Message Given:    Date Medicare IM Given:    Medicare IM give by:    Date Additional Medicare IM Given:    Additional Medicare Important Message give by:     If discussed at [place] of Stay Meetings, dates discussed:    Additional Comments:  Dessa Phi, RN 07/23/2014, 3:54 PM

## 2014-07-23 NOTE — Discharge Summary (Signed)
[name] MRN: [id number] DOB/AGE: 35-Oct-1982 35 y.o.  Admit date: 07/20/2014 Discharge date: 07/23/2014  Primary Care Physician:  Janei Scheff A., MD   Recommendations for follow up  1. Lexicon myoview scan  2. Add NSAID to pain regimen 3. Check CBC with diff and reticulocyte for Hydrea parameters and in light of ASA and Lovenox use. 4. Overnight Oxygen saturation study  Discharge Diagnoses:   Patient Active Problem List   Diagnosis Date Noted  . Pulmonary HTN 06/18/2013    Priority: High  . Functional asplenia     Priority: High  . Avascular necrosis     Priority: Medium  . Ankle edema 07/07/2014  . Sickle cell anemia 06/25/2014  . Anemia of chronic disease 06/25/2014  . PAH (pulmonary artery hypertension) 03/18/2014  . Pulmonary embolism 03/18/2014  . Hypoxia 03/14/2014  . Atrial fibrillation with RVR 03/11/2014  . 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 Pulmonary embolus 01/01/2014  . Paralytic strabismus, external ophthalmoplegia   . Chronic pain syndrome 12/12/2013  . Anemia 09/22/2013  . Chronic anticoagulation 08/22/2013  . Essential hypertension 08/22/2013  . Vitamin D deficiency 02/13/2013  . Uses marijuana 01/13/2013  . Leukocytosis 08/30/2012  . Rib pain 08/06/2012  . Embolism, pulmonary with infarction 07/09/2012  . Hx of pulmonary embolus 06/29/2012  . Hemochromatosis 12/14/2011    DISCHARGE MEDICATION:   Medication List    STOP taking these medications        metroNIDAZOLE 500 MG tablet  Commonly known as:  FLAGYL     rivaroxaban 20 MG Tabs tablet  Commonly known as:  XARELTO      TAKE these medications        aspirin 81 MG chewable tablet  Chew 1 tablet (81 mg total) by mouth daily.     diltiazem 120 MG 24 hr capsule  Commonly known as:  CARDIZEM CD  Take 1 capsule (120 mg total) by mouth daily.     enoxaparin 150 MG/ML injection  Commonly known as:  LOVENOX  Inject 0.73 mLs (110 mg total) into the skin every bedtime  daily. Starting  tonight 07/23/2014 at 86:76PP.     folic acid 1 MG tablet  Commonly known as:  FOLVITE  Take 1 tablet (1 mg total) by mouth every morning.     HYDROmorphone 4 MG tablet  Commonly known as:  DILAUDID  Take 1 tablet (4 mg total) by mouth every 4 (four) hours as needed for severe pain.     hydroxyurea 500 MG capsule  Commonly known as:  HYDREA  Take 3 capsules (1,500 mg total) by mouth daily. May take with food to minimize GI side effects.     lisinopril 10 MG tablet  Commonly known as:  PRINIVIL,ZESTRIL  Take 1 tablet (10 mg total) by mouth daily.     metoprolol tartrate 25 MG tablet  Commonly known as:  LOPRESSOR  Take 1 tablet (25 mg total) by mouth daily.     morphine 30 MG 12 hr tablet  Commonly known as:  MS CONTIN  Take 1 tablet (30 mg total) by mouth every 12 (twelve) hours.     potassium chloride SA 20 MEQ tablet  Commonly known as:  K-DUR,KLOR-CON  Take 1 tablet (20 mEq total) by mouth every morning.     Vitamin D 2000 UNITS tablet  Take 1 tablet (2,000 Units total) by mouth daily.     zolpidem 10 MG tablet  Commonly known as:  AMBIEN  Take 1 tablet (  10 mg total) by mouth at bedtime as needed for sleep.          Consults: Treatment Team:  Rounding Lbcardiology, MD   SIGNIFICANT DIAGNOSTIC STUDIES:  Dg Chest 2 View  07/20/2014   CLINICAL DATA:  Chest pain for 1 day.  Sickle cell.  EXAM: CHEST  2 VIEW  COMPARISON:  07/14/2014  FINDINGS: Prominent cardiomegaly. Left subclavian Port-A-Cath device with its tip at the cavoatrial junction is stable. Interstitial changes towards lung bases are stable. No pneumothorax. No pleural effusion. No consolidation  IMPRESSION: Cardiomegaly and chronic basilar interstitial changes.   Electronically Signed   By: Marybelle Killings M.D.   On: 07/20/2014 10:12   Dg Chest 2 View  07/14/2014   CLINICAL DATA:  Sickle cell pain crisis. Mid to left upper back pain. Weakness. Started several hours ago.  EXAM: CHEST  2 VIEW   COMPARISON:  06/25/2014  FINDINGS: Cardiac enlargement with patchy interstitial changes in the lungs similar to previous study. No focal consolidation or airspace disease. No blunting of costophrenic angles. No pneumothorax. Power port type central venous catheter with tip over the cavoatrial junction.  IMPRESSION: Cardiac enlargement with stable patchy interstitial changes in the lungs. No acute infiltration.   Electronically Signed   By: Lucienne Capers M.D.   On: 07/14/2014 06:09   Dg Chest 2 View  06/25/2014   CLINICAL DATA:  Sickle cell crisis, chest pain.  EXAM: CHEST  2 VIEW  COMPARISON:  Chest radiograph Jun 23, 2014  FINDINGS: Moderate cardiomegaly, unchanged from prior examination. Mediastinal silhouette is nonsuspicious. Diffuse slightly increasing interstitial prominence without pleural effusion or focal consolidation. No pneumothorax. 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 Single lumen LEFT chest Port-A-Cath with distal tip at cavoatrial junction.  IMPRESSION: Similar cardiomegaly. Mildly increasing interstitial prominence (atypical infection or pulmonary edema) without focal consolidation.   Electronically Signed   By: Elon Alas M.D.   On: 06/25/2014 04:17   Dg Chest 2 View  06/24/2014   CLINICAL DATA:  Left flank pain, weakness, shortness of breath for 1 day, history of sickle cell disease  EXAM: CHEST  2 VIEW  COMPARISON:  Chest x-ray of 06/03/2014  FINDINGS: There is little change in cardiomegaly with minimal fullness of the perihilar vasculature. No frank congestive heart failure seen and no effusion is noted. Left Port-A-Cath is present with the tip seen to the expected SVC -RA junction. No bony abnormality is noted.  IMPRESSION: No significant change in cardiomegaly and perhaps minimal pulmonary vascular congestion. No effusion   Electronically Signed   By: Ivar Drape M.D.   On: 06/24/2014 08:26   Ct Angio Chest Pe W/cm &/or Wo  Cm  07/20/2014   CLINICAL DATA:  Chest pain, sickle cell crisis  EXAM: CT ANGIOGRAPHY CHEST WITH CONTRAST  TECHNIQUE: Multidetector CT imaging of the chest was performed using the standard protocol during bolus administration of intravenous contrast. Multiplanar CT image reconstructions and MIPs were obtained to evaluate the vascular anatomy.  CONTRAST:  164mL OMNIPAQUE IOHEXOL 350 MG/ML SOLN  COMPARISON:  07/20/2014 chest radiograph, chest CT 03/19/2014  FINDINGS: Mediastinum/Nodes: Left-sided Port-A-Cath in place with tip in the right atrium. Cardiomegaly noted. Right atrial prominence. No pericardial effusion. AP window lymphadenopathy persists, largest 1.0 cm image 32. Representative pretracheal node measures 0.7 cm image 37.  Pulmonary arterial dilation is reidentified measuring 4.0 cm maximal transverse diameter image 121 series 6. The examination is adequate for evaluation for acute pulmonary embolism  up to and including the 3rd order pulmonary arteries. Multiple bilateral lower lobe filling defects are identified compatible with pulmonary embolism, for example image 178 series 6. Allowing for motion artifact, there is no evidence for right heart strain as by calculated RV: LV ratio of 0.8.  Lungs/Pleura: Areas of mosaic type per fusion are identified bilaterally, likely related to pulmonary emboli. Curvilinear bilateral lower lobe scarring or atelectasis is noted.  Upper abdomen: Normal  Musculoskeletal: Inhomogeneous trabecular pattern of the vertebral bodies is compatible with sickle cell deformity. No acute osseous abnormality.  Review of the MIP images confirms the above findings.  IMPRESSION: Small to moderate overall clot burden with bilateral predominantly lower lobe pulmonary emboli. No CT evidence for right heart strain.  Cardiomegaly.  Mosaic type perfusion pattern within the lung parenchyma compatible with pulmonary emboli.  Critical Value/emergent results were called by telephone at the time of  interpretation on 07/20/2014 at 11:58 am to Dr. Milton Ferguson , who verbally acknowledged these results.   Electronically Signed   By: Conchita Paris M.D.   On: 07/20/2014 12:00      Recent Results (from the past 240 hour(s))  MRSA PCR Screening     Status: None   Collection Time: 07/21/14  8:41 AM  Result Value Ref Range Status   MRSA by PCR NEGATIVE NEGATIVE Final    Comment:        The GeneXpert MRSA Assay (FDA approved for NASAL specimens only), is one component of a comprehensive MRSA colonization surveillance program. It is not intended to diagnose MRSA infection nor to guide or monitor treatment for MRSA infections.     BRIEF ADMITTING H & P: [name] is a 34yo male with sickle cell disease who presented with complaints of chest pain. He was recently seen in the day hospital on 07/14/14, where he was recommended for admission due to concern for PE but pt left AMA. He came into the ED this morning after experiencing CP that started suddenly last night. CP is located from his mid to lower sternum and extends to his left lower ribs. He reports feeling increased DOE for the past week. He can go about 10 steps before feeling SOB. He continues to sleep on 3 pillows, which is stable for him. He denies any dizziness, HA, weakness, syncope, palpitations, and fever. He is not using any home oxygen, although it was recommended in the past. He had two episodes of emesis that was yellow in color. Denies diarrhea, last bowel movement was 2 days ago. He states that he has been taking his Xarelto every morning and can not recall missing any doses.   In the ED, he was hypoxic down to 85% o2 sat. He was placed on 2LPM via nasal cannula. A CT was performed showing small to moderate overall clot burden with bilateral predominantly lower lobe pulmonary emboli. He was started on a heparin drip. He was given IV Dilaudid for his pain. His Hgb was down to 6.8 so 2 units PRBC were ordered to be  given.   Hospital Course:  Present on Admission:  . Demand Ischemia: Pt was noted to have elevated Troponin which was likely multifactorial owing to PE, anemia and pain.  He was evaluated by Cardiology who recommended Lexicon Myoview as an out patient. At the time of discharge the troponin was trending down from a peak of 5.11 down to 2.31.  . Pulmonary embolism: Pt had another PE while compliant with Xarelto. Pt was initially placed  on IV Heparin and then transitioned to Lovenox q 12 hours. Hematology was consulted who recommended Lovenox  and referral to coagulation clinic as an out patient. Pt is being discharged home on Lovenox 110 mg Wilson q HS starting tonight. . Anemia: Pt had acute on chronic anemia. He demonstrated hemolysis in the setting of anemia of chronic disease. He was transfused 2 units RBC's during the hospitalization. At the time of discharge, his Hb was at 8.3 g/dL.  Marland Kitchen Hb-SS disease with crisis: Pt was managed with Dilaudid via PCA in addition to Toradol, MS contin and IVF. His pain improved and he was transitioned to oral Dilaudid every 4 hours. He is discharged on MS Contin and Dilaudid. Consider adding NSAID to his chronic pain regimen. . Hypoxia: Pt demonstrates hypoxia during the clinical course which is multifactorial. He had a low oxygen level overnight which required increased concentration of oxygen. He did not qualify for home oxygen use based on ambulatory saturations. However he would benefit from nocturnal saturatuion studies at home. . Pulmonary Arterial Hypertension with Cor Pulmonale: This may likely worsen as a result of the new PE.  A repeat ECHO showed sever right sided heart failure and Cor Pulmonale. Recommend overnight saturation study and retrun appointment to Dr. Festus Aloe  At [facility] who has seen [name] for pulmonary HTN.  Disposition and Follow-up:  Pt is to follow up with NP Cammie Sickle in clinic on 08/06/2014.    DISCHARGE EXAM:   General: Alert, awake, oriented x3, in mild distress.  Vital Signs: BP 107/59, HR 75,T 98.3 F (36.8 C), temperature source Oral, RR 15, height 6' (1.829 m), weight 162 lb 7.7 oz (73.7 kg), SpO2 92 %. HEENT: [place]/AT PEERL, EOMI, anicteric Neck: Trachea midline, no masses, no thyromegal,y no JVD, no carotid bruit OROPHARYNX: Moist, No exudate/ erythema/lesions.  Heart: Regular rate and rhythm, I-II/VI SEM at LUSB. No rubs, gallops or S3. PMI non-displaced. Exam reveals no decreased pulses. Pulmonary/Chest: Normal effort. Breath sounds normal. No. Apnea. Clear to auscultation,no stridor,  no wheezing and no rhonchi noted. No respiratory distress and no tenderness noted. Abdomen: Soft, nontender, nondistended, normal bowel sounds, no masses no hepatosplenomegaly noted. No fluid wave and no ascites. There is no guarding or rebound. Neuro: Alert and oriented to person, place and time. Normal motor skills, Displays no atrophy or tremors and exhibits normal muscle tone.  No focal neurological deficits noted cranial nerves II through XII grossly intact. No sensory deficit noted. Strength at baseline in bilateral upper and lower extremities. Gait normal. Musculoskeletal: No warm swelling or erythema around joints, no spinal tenderness noted. Psychiatric: Patient alert and oriented x3, good insight and cognition, good recent to remote recall. Lymph node survey: No cervical axillary or inguinal lymphadenopathy noted. Skin: Skin is warm and dry. No bruising, no ecchymosis and no rash noted. Pt is not diaphoretic. No erythema. No pallor Psychiatric: Mood, memory, affect and judgement normal     Recent Labs  07/22/14 0452 07/23/14 1145  NA 134* 136  K 3.4* 4.2  CL 105 107  CO2 22 23  GLUCOSE 119* 102*  BUN 6 7  CREATININE 0.53* 0.53*  CALCIUM 8.4* 8.7*  MG  --  1.7   No results for input(s): AST, ALT, ALKPHOS, BILITOT, PROT, ALBUMIN in the last 72 hours. No results for input(s): LIPASE,  AMYLASE in the last 72 hours.  Recent Labs  07/22/14 0452 07/23/14 1145  WBC 17.5* 14.4*  NEUTROABS 13.6* 9.6*  HGB 7.9* 8.3*  HCT 23.2* 24.6*  MCV 89.6 87.9  PLT 423* 435*     Total time spent including face to face and decision making was greater than 30 minutes  Signed: Alvaro Aungst A. 07/23/2014, 3:39 PM

## 2014-07-24 DIAGNOSIS — I2781 Cor pulmonale (chronic): Secondary | ICD-10-CM

## 2014-07-24 DIAGNOSIS — D638 Anemia in other chronic diseases classified elsewhere: Secondary | ICD-10-CM

## 2014-07-24 MED ORDER — HEPARIN SOD (PORK) LOCK FLUSH 100 UNIT/ML IV SOLN
500.0000 [IU] | INTRAVENOUS | Status: AC | PRN
  Administered 2014-07-24: 500 [IU]

## 2014-07-24 NOTE — Care Management Note (Signed)
Case Management Note  Patient Details  Name: [name] MRN: [id number] Date of Birth: [birth year]  Subjective/Objective:                    Action/Plan:d/c home w/home 02, home overnight pulse oximetry also by [facility].No further d/c needs.   Expected Discharge Date:                  Expected Discharge Plan:  Home/Self Care  In-House Referral:  NA  Discharge planning Services  CM Consult, Medication Assistance  Post Acute Care Choice:    Choice offered to:  Patient  DME Arranged:  N/A, Pulse oximeter, Oxygen DME Agency:  [name]:  NA [place] Agency:  NA  Status of Service:  Completed, signed off  Medicare Important Message Given:    Date Medicare IM Given:    Medicare IM give by:    Date Additional Medicare IM Given:    Additional Medicare Important Message give by:     If discussed at [place] of Stay Meetings, dates discussed:    Additional Comments:  Dessa Phi, RN 07/24/2014, 11:36 AM

## 2014-07-24 NOTE — Progress Notes (Signed)
Porta cath deaccessed. Flushed with 10cc NS followed by Heparin 5ml (100u/ml). No bleeding to site, band aid to site for comfort. Kalli Greenfield M 

## 2014-07-24 NOTE — Care Management Note (Signed)
Case Management Note  Patient Details  Name: KENNIS WISSMANN MRN: [id number] Date of Birth: Jun 14, 1980  Subjective/Objective: Patient qualifies for home 02, can arrange if ordered.Nsg notified.                   Action/Plan:d/c plan home.   Expected Discharge Date:                  Expected Discharge Plan:  Home/Self Care  In-House Referral:  NA  Discharge planning Services  CM Consult, Medication Assistance  Post Acute Care Choice:    Choice offered to:     DME Arranged:  N/A DME Agency:  NA  HH Arranged:  NA HH Agency:  NA  Status of Service:  Completed, signed off  Medicare Important Message Given:    Date Medicare IM Given:    Medicare IM give by:    Date Additional Medicare IM Given:    Additional Medicare Important Message give by:     If discussed at [place] of Stay Meetings, dates discussed:    Additional Comments:  Dessa Phi, RN 07/24/2014, 9:43 AM

## 2014-07-24 NOTE — Progress Notes (Signed)
Subjective: Pain controlled with PCA pain management  Objective: Vital signs in last 24 hours: Temp:  [98.2 F (36.8 C)-98.9 F (37.2 C)] 98.4 F (36.9 C) (06/23 0434) Pulse Rate:  [70-77] 70 (06/23 0434) Resp:  [14-20] 14 (06/23 0434) BP: (101-118)/(59-76) 101/61 mmHg (06/23 0434) SpO2:  [91 %-95 %] 94 % (06/23 0434) Weight change:  Last BM Date: 07/19/14 Intake/Output from previous day: -[place] 06/22 0701 - 06/23 0700 In: 370 [P.O.:360; I.V.:10] Out: 3100 [Urine:3100] Intake/Output this shift:    PE: General:Pleasant affect, NAD, not very talkative Skin:Warm and dry, brisk capillary refill HEENT:normocephalic, sclera clear, mucus membranes moist Heart:S1S2 RRR without murmur, + S4 gallup, no rub or click Lungs:clear without rales, rhonchi, or wheezes OZD:GUYQ, non tender, + BS, do not palpate liver spleen or masses Ext:no lower ext edema, 2+ pedal pulses, 2+ radial pulses Neuro:alert and oriented X 3, MAE, follows commands, + facial symmetry   Lab Results:  Recent Labs  07/22/14 0452 07/23/14 1145  WBC 17.5* 14.4*  HGB 7.9* 8.3*  HCT 23.2* 24.6*  PLT 423* 435*   BMET  Recent Labs  07/22/14 0452 07/23/14 1145  NA 134* 136  K 3.4* 4.2  CL 105 107  CO2 22 23  GLUCOSE 119* 102*  BUN 6 7  CREATININE 0.53* 0.53*  CALCIUM 8.4* 8.7*    Recent Labs  07/21/14 0905 07/22/14 0452  TROPONINI 3.66* 2.31*    Lab Results  Component Value Date   CHOL 63 06/04/2013   HDL 24* 06/04/2013   LDLCALC 26 06/04/2013   TRIG 63 06/04/2013   CHOLHDL 2.6 06/04/2013   No results found for: HGBA1C   Lab Results  Component Value Date   TSH 0.280* 01/13/2013      Studies/Results: ECHO:  Study Conclusions  - Left ventricle: The cavity size was normal. Wall thickness was normal. Systolic function was normal. The estimated ejection fraction was in the range of 55% to 60%. - Right ventricle: The cavity size was severely dilated. - Right atrium:  The atrium was severely dilated. - Atrial septum: No defect or patent foramen ovale was identified. - Tricuspid valve: There was moderate regurgitation. - Impressions: Severe right sided failure and cor pulmonale. Impressions: - Severe right sided failure and cor pulmonale.  Previous Echo in 03/2014: - Left ventricle: The cavity size was normal. Wall thickness was increased in a pattern of mild LVH. Systolic function was normal. The estimated ejection fraction was in the range of 50% to 55%. Wall motion was normal; there were no regional wall motion abnormalities. - Ventricular septum: The contour showed systolic flattening. These changes are consistent with RV pressure overload. - Right ventricle: The cavity size was mildly dilated. Wall thickness was normal. Systolic function was moderately reduced. - Right atrium: The atrium was moderately dilated. - Tricuspid valve: There was moderate regurgitation. - Pulmonary arteries: Systolic pressure was moderately to severely increased. PA peak pressure: 76 mm Hg (S).  Medications: I have reviewed the patient's current medications. Scheduled Meds: . aspirin  81 mg Oral Daily  . cholecalciferol  2,000 Units Oral Daily  . diltiazem  120 mg Oral Daily  . enoxaparin (LOVENOX) injection  75 mg Subcutaneous BID  . folic acid  1 mg Oral q morning - 10a  . HYDROmorphone PCA 2 mg/mL   Intravenous 6 times per day  . hydroxyurea  1,500 mg Oral Daily  . metoprolol tartrate  25 mg Oral Daily  .  morphine  30 mg Oral Q12H  . potassium chloride SA  20 mEq Oral q morning - 10a  . senna-docusate  1 tablet Oral QHS   Continuous Infusions: . dextrose 5 % and 0.45% NaCl 100 mL/hr at 07/22/14 2055   PRN Meds:.diphenhydrAMINE (BENADRYL) IVPB(SICKLE CELL ONLY) **OR** diphenhydrAMINE, naloxone **AND** sodium chloride, ondansetron (ZOFRAN) IV, sodium chloride, zolpidem  Assessment/Plan: Principal Problem:   Pulmonary embolism Active  Problems:   Chronic anticoagulation   Anemia   Pulmonary embolus   Hb-SS disease with crisis  1. PVCs: review of prior telemetry strips show occasional PVCs and some bigeminy. In the setting of mild hypokalemia. He received supplemental K -K+ yesterday 4.2. Telemetry now shows NSR with only a few isolated PVCs. He is fairly asymptomatic. He notes only occasional skipped beats but noting significant. No dizziness, syncope/ near syncope. Continue home Cardizem and metoprolol. Keep K WNL.   2. Elevated Troponin: He did have significant troponin levels, however with downward trend (5.11-->3.66-->2.31). Also in the setting of PE, making demand ischemia very likely. His EKG from 6/19 did show some lateral ST abnormalities. His sympotoms have improved with treatment of his PE, plan an lexiscan myoview test as an outpatient, once he recovers, to risk stratify. Echo with Rt sided HF.  Has known elevated PA pressures.  3. PE, with hypoxia. On lovenox 75 mg BID  4. Anemia, has rec'd 2 units PRBC h/h 8.3/24.6    LOS: 4 days   Time spent with pt. : 15 minutes. [facility] R  Nurse Practitioner Certified Pager [phone] or after 5pm and on weekends call [phone] 07/24/2014, 8:32 AM   History and all data above reviewed.  Patient examined.  I agree with the findings as above.  The patient denies any chest pain.   The patient exam reveals COR:RRR  ,  Lungs: Clear  ,  Abd: Positive bowel sounds, no rebound no guarding, Ext No edema  .  All available labs, radiology testing, previous records reviewed. Agree with documented assessment and plan. Chest pain:  Plan out patient Lexiscan Myoview.  We will schedule a follow up appt in our office in about a month first.  Pulmonary HTN:  This is again noted on echo and probably more severe than previous.  I agree with O2.  Also, per pulmonary they suggest referring to [facility] .   Jeneen Rinks Kitrina Maurin  11:21 AM  07/24/2014

## 2014-07-24 NOTE — Care Management Note (Signed)
Case Management Note  Patient Details  Name: HUTCHINSON ISENBERG MRN: [id number] Date of Birth: 06/25/1980  Subjective/Objective:     02 pulse oximetry overnight to be checked on ra, & on 02-[facility] dme rep Lecretia aware.               Action/Plan:dj/c plan home.   Expected Discharge Date:                  Expected Discharge Plan:  Home/Self Care  In-House Referral:  NA  Discharge planning Services  CM Consult, Medication Assistance  Post Acute Care Choice:    Choice offered to:  Patient  DME Arranged:  N/A, Pulse oximeter, Oxygen DME Agency:  [place]:  NA [place] Agency:  NA  Status of Service:  Completed, signed off  Medicare Important Message Given:    Date Medicare IM Given:    Medicare IM give by:    Date Additional Medicare IM Given:    Additional Medicare Important Message give by:     If discussed at [place] of Stay Meetings, dates discussed:    Additional Comments:  Dessa Phi, RN 07/24/2014, 12:17 PM

## 2014-07-24 NOTE — Progress Notes (Signed)
SICKLE CELL SERVICE PROGRESS NOTE  [name] NKN:[id number] DOB: 1980/12/12 DOA: 07/20/2014 PCP: Millette Halberstam A., MD  Assessment/Plan: Principal Problem:   Pulmonary embolism Active Problems:   Chronic anticoagulation   Anemia   Pulmonary embolus   Hb-SS disease with crisis  1. Pulmonary Embolus: Pt has had another PE on this admission all while maintaining his compliance with Xarelto. Dr. Alben Deeds spoke with Dr. Benay Spice ([facility]) who recommended Lovenox. The patient has been started on Lovenox and verbalizes his ability to administer Lovenox. However will place formal consult to Nursing and Pharmacy for teaching. Pt will likely benefit from a referral to coagulation clinic for recurrent PE while anticoagulated. 2. Cor Pulmonale/Hypoxia: Oxygen levels are such that patient qualifies for Oxygen use. He was unable to get the Oxygen last night so discharge was cancelled until oxygen could be arranged for today. Oxygen arranged by [place]. Will also get overnight pulse oxymetry study. He will need evaluation for OSA as an out patient. 3.  Hb SS with crisis: Pain essentially at baseline. 4. Anemia: Pt has anemia of chronic disease and has been transfused 2 units of RBC's. In light of Cor Pulmonale may need a different plan of maintaining higher Hb. Would recommend that he be seen by Dr. Felisa Bonier soon and the specific question of recommendations for Hb level be made.   Code Status: Full Code Family Communication: N/A Disposition Plan: Ready for discharge  [name] A.  Pager [phone]. If 7PM-7AM, please contact night-coverage.  07/24/2014, 10:59 AM  LOS: 4 days   Consultants:  None  Procedures:  None  Antibiotics:  None  HPI/Subjective: Pt reports pain in ribs and back now 4-5/10.   Objective: Filed Vitals:   07/24/14 0200 07/24/14 0418 07/24/14 0434 07/24/14 0929  BP: 108/76  101/61 112/71  Pulse: 76  70 69  Temp: 98.7 F (37.1 C)  98.4 F (36.9  C) 97.9 F (36.6 C)  TempSrc: Oral  Oral Oral  Resp: 15 17 14 14   Height:      Weight:      SpO2: 93% 93% 94% 92%   Weight change:   Intake/Output Summary (Last 24 hours) at 07/24/14 1059 Last data filed at 07/24/14 0932  Gross per 24 hour  Intake    610 ml  Output   2700 ml  Net  -2090 ml    General: Alert, awake, oriented x3, in no acute distress.  HEENT: [place]/AT PEERL, EOMI, mild icterus Neck: Trachea midline,  no masses, no thyromegal,y no JVD, no carotid bruit OROPHARYNX:  Moist, No exudate/ erythema/lesions.  Heart: Regular rate and rhythm,  1/VI SEM at LUSB. No rubs or gallops and PMI non-displaced, no heaves or thrills on palpation.  Lungs: Clear to auscultation, no wheezing or rhonchi noted. No increased vocal fremitus resonant to percussion  Abdomen: Soft, nontender, nondistended, positive bowel sounds, no masses no hepatosplenomegaly noted.  Neuro: No focal neurological deficits noted cranial nerves II through XII grossly intact. Strength at baseline in bilateral upper and lower extremities. Musculoskeletal: No warm swelling or erythema around joints, no spinal tenderness noted. Psychiatric: Patient alert and oriented x3, good insight and cognition, good recent to remote recall.    Data Reviewed: Basic Metabolic Panel:  Recent Labs Lab 07/20/14 1023 07/21/14 0208 07/22/14 0452 07/23/14 1145  NA 140 137 134* 136  K 3.6 3.9 3.4* 4.2  CL 112* 110 105 107  CO2 21* 22 22 23   GLUCOSE 117* 125* 119* 102*  BUN 11 8  6 7  CREATININE 0.71 0.65 0.53* 0.53*  CALCIUM 8.6* 8.6* 8.4* 8.7*  MG  --   --   --  1.7   Liver Function Tests:  Recent Labs Lab 07/20/14 1023  AST 35  ALT 17  ALKPHOS 82  BILITOT 7.0*  PROT 7.5  ALBUMIN 3.9   No results for input(s): LIPASE, AMYLASE in the last 168 hours. No results for input(s): AMMONIA in the last 168 hours. CBC:  Recent Labs Lab 07/20/14 1023 07/21/14 0208 07/22/14 0452 07/23/14 1145  WBC 16.8* 15.7* 17.5*  14.4*  NEUTROABS 13.3* 10.8* 13.6* 9.6*  HGB 6.8* 8.4* 7.9* 8.3*  HCT 19.8* 24.8* 23.2* 24.6*  MCV 94.3 90.8 89.6 87.9  PLT 583* 489* 423* 435*   Cardiac Enzymes:  Recent Labs Lab 07/20/14 1023 07/20/14 1630 07/21/14 0208 07/21/14 0905 07/22/14 0452  TROPONINI 0.67* 4.60* 5.11* 3.66* 2.31*   BNP (last 3 results) No results for input(s): BNP in the last 8760 hours.  ProBNP (last 3 results)  Recent Labs  01/01/14 0909  PROBNP 5790.0*    CBG: No results for input(s): GLUCAP in the last 168 hours.  Recent Results (from the past 240 hour(s))  MRSA PCR Screening     Status: None   Collection Time: 07/21/14  8:41 AM  Result Value Ref Range Status   MRSA by PCR NEGATIVE NEGATIVE Final    Comment:        The GeneXpert MRSA Assay (FDA approved for NASAL specimens only), is one component of a comprehensive MRSA colonization surveillance program. It is not intended to diagnose MRSA infection nor to guide or monitor treatment for MRSA infections.      Studies: Dg Chest 2 View  07/20/2014   CLINICAL DATA:  Chest pain for 1 day.  Sickle cell.  EXAM: CHEST  2 VIEW  COMPARISON:  07/14/2014  FINDINGS: Prominent cardiomegaly. Left subclavian Port-A-Cath device with its tip at the cavoatrial junction is stable. Interstitial changes towards lung bases are stable. No pneumothorax. No pleural effusion. No consolidation  IMPRESSION: Cardiomegaly and chronic basilar interstitial changes.   Electronically Signed   By: Marybelle Killings M.D.   On: 07/20/2014 10:12   Dg Chest 2 View  07/14/2014   CLINICAL DATA:  Sickle cell pain crisis. Mid to left upper back pain. Weakness. Started several hours ago.  EXAM: CHEST  2 VIEW  COMPARISON:  06/25/2014  FINDINGS: Cardiac enlargement with patchy interstitial changes in the lungs similar to previous study. No focal consolidation or airspace disease. No blunting of costophrenic angles. No pneumothorax. Power port type central venous catheter with tip  over the cavoatrial junction.  IMPRESSION: Cardiac enlargement with stable patchy interstitial changes in the lungs. No acute infiltration.   Electronically Signed   By: Lucienne Capers M.D.   On: 07/14/2014 06:09   Dg Chest 2 View  06/25/2014   CLINICAL DATA:  Sickle cell crisis, chest pain.  EXAM: CHEST  2 VIEW  COMPARISON:  Chest radiograph Jun 23, 2014  FINDINGS: Moderate cardiomegaly, unchanged from prior examination. Mediastinal silhouette is nonsuspicious. Diffuse slightly increasing interstitial prominence without pleural effusion or focal consolidation. No pneumothorax. 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 Single lumen LEFT chest Port-A-Cath with distal tip at cavoatrial junction.  IMPRESSION: Similar cardiomegaly. Mildly increasing interstitial prominence (atypical infection or pulmonary edema) without focal consolidation.   Electronically Signed   By: Elon Alas M.D.   On: 06/25/2014 04:17   Ct Angio  Chest Pe W/cm &/or Wo Cm  07/20/2014   CLINICAL DATA:  Chest pain, sickle cell crisis  EXAM: CT ANGIOGRAPHY CHEST WITH CONTRAST  TECHNIQUE: Multidetector CT imaging of the chest was performed using the standard protocol during bolus administration of intravenous contrast. Multiplanar CT image reconstructions and MIPs were obtained to evaluate the vascular anatomy.  CONTRAST:  155mL OMNIPAQUE IOHEXOL 350 MG/ML SOLN  COMPARISON:  07/20/2014 chest radiograph, chest CT 03/19/2014  FINDINGS: Mediastinum/Nodes: Left-sided Port-A-Cath in place with tip in the right atrium. Cardiomegaly noted. Right atrial prominence. No pericardial effusion. AP window lymphadenopathy persists, largest 1.0 cm image 32. Representative pretracheal node measures 0.7 cm image 37.  Pulmonary arterial dilation is reidentified measuring 4.0 cm maximal transverse diameter image 121 series 6. The examination is adequate for evaluation for acute pulmonary embolism up to and  including the 3rd order pulmonary arteries. Multiple bilateral lower lobe filling defects are identified compatible with pulmonary embolism, for example image 178 series 6. Allowing for motion artifact, there is no evidence for right heart strain as by calculated RV: LV ratio of 0.8.  Lungs/Pleura: Areas of mosaic type per fusion are identified bilaterally, likely related to pulmonary emboli. Curvilinear bilateral lower lobe scarring or atelectasis is noted.  Upper abdomen: Normal  Musculoskeletal: Inhomogeneous trabecular pattern of the vertebral bodies is compatible with sickle cell deformity. No acute osseous abnormality.  Review of the MIP images confirms the above findings.  IMPRESSION: Small to moderate overall clot burden with bilateral predominantly lower lobe pulmonary emboli. No CT evidence for right heart strain.  Cardiomegaly.  Mosaic type perfusion pattern within the lung parenchyma compatible with pulmonary emboli.  Critical Value/emergent results were called by telephone at the time of interpretation on 07/20/2014 at 11:58 am to Dr. Milton Ferguson , who verbally acknowledged these results.   Electronically Signed   By: Conchita Paris M.D.   On: 07/20/2014 12:00    Scheduled Meds: . aspirin  81 mg Oral Daily  . cholecalciferol  2,000 Units Oral Daily  . diltiazem  120 mg Oral Daily  . enoxaparin (LOVENOX) injection  75 mg Subcutaneous BID  . folic acid  1 mg Oral q morning - 10a  . HYDROmorphone PCA 2 mg/mL   Intravenous 6 times per day  . hydroxyurea  1,500 mg Oral Daily  . metoprolol tartrate  25 mg Oral Daily  . morphine  30 mg Oral Q12H  . potassium chloride SA  20 mEq Oral q morning - 10a  . senna-docusate  1 tablet Oral QHS   Continuous Infusions: . dextrose 5 % and 0.45% NaCl 100 mL/hr at 07/22/14 2055    Time spent 45 minutes. More than 50% of time spent discussing Hb SS, Cor Pulmonale, smoking and PE.

## 2014-07-30 ENCOUNTER — Encounter (HOSPITAL_COMMUNITY): Payer: Self-pay | Admitting: *Deleted

## 2014-07-30 ENCOUNTER — Telehealth (HOSPITAL_COMMUNITY): Payer: Self-pay | Admitting: Hematology

## 2014-07-30 ENCOUNTER — Non-Acute Institutional Stay (HOSPITAL_COMMUNITY)
Admission: AD | Admit: 2014-07-30 | Discharge: 2014-07-30 | Disposition: A | Discharge status: Home / Self Care | Payer: Medicare Other | Attending: Internal Medicine | Admitting: Internal Medicine

## 2014-07-30 DIAGNOSIS — Z7982 Long term (current) use of aspirin: Secondary | ICD-10-CM | POA: Insufficient documentation

## 2014-07-30 DIAGNOSIS — E876 Hypokalemia: Secondary | ICD-10-CM | POA: Insufficient documentation

## 2014-07-30 DIAGNOSIS — Z9114 Patient's other noncompliance with medication regimen: Secondary | ICD-10-CM | POA: Insufficient documentation

## 2014-07-30 DIAGNOSIS — Z86711 Personal history of pulmonary embolism: Secondary | ICD-10-CM | POA: Insufficient documentation

## 2014-07-30 DIAGNOSIS — Z87891 Personal history of nicotine dependence: Secondary | ICD-10-CM | POA: Insufficient documentation

## 2014-07-30 DIAGNOSIS — D571 Sickle-cell disease without crisis: Secondary | ICD-10-CM | POA: Diagnosis present

## 2014-07-30 DIAGNOSIS — F1210 Cannabis abuse, uncomplicated: Secondary | ICD-10-CM | POA: Diagnosis not present

## 2014-07-30 DIAGNOSIS — I10 Essential (primary) hypertension: Secondary | ICD-10-CM | POA: Diagnosis not present

## 2014-07-30 DIAGNOSIS — Z7901 Long term (current) use of anticoagulants: Secondary | ICD-10-CM | POA: Diagnosis not present

## 2014-07-30 DIAGNOSIS — D473 Essential (hemorrhagic) thrombocythemia: Secondary | ICD-10-CM | POA: Insufficient documentation

## 2014-07-30 DIAGNOSIS — D5700 Hb-SS disease with crisis, unspecified: Secondary | ICD-10-CM | POA: Diagnosis not present

## 2014-07-30 DIAGNOSIS — Z79899 Other long term (current) drug therapy: Secondary | ICD-10-CM | POA: Diagnosis not present

## 2014-07-30 LAB — CBC WITH DIFFERENTIAL/PLATELET
BASOS ABS: 0.2 10*3/uL — AB (ref 0.0–0.1)
Basophils Relative: 1 % (ref 0–1)
EOS PCT: 3 % (ref 0–5)
Eosinophils Absolute: 0.4 10*3/uL (ref 0.0–0.7)
HCT: 21.5 % — ABNORMAL LOW (ref 39.0–52.0)
Hemoglobin: 7.2 g/dL — ABNORMAL LOW (ref 13.0–17.0)
LYMPHS PCT: 17 % (ref 12–46)
Lymphs Abs: 2.2 10*3/uL (ref 0.7–4.0)
MCH: 30.8 pg (ref 26.0–34.0)
MCHC: 33.5 g/dL (ref 30.0–36.0)
MCV: 91.9 fL (ref 78.0–100.0)
Monocytes Absolute: 1.6 10*3/uL — ABNORMAL HIGH (ref 0.1–1.0)
Monocytes Relative: 13 % — ABNORMAL HIGH (ref 3–12)
NEUTROS ABS: 8.6 10*3/uL — AB (ref 1.7–7.7)
NEUTROS PCT: 66 % (ref 43–77)
PLATELETS: 632 10*3/uL — AB (ref 150–400)
RBC: 2.34 MIL/uL — AB (ref 4.22–5.81)
RDW: 18.9 % — AB (ref 11.5–15.5)
WBC: 12.9 10*3/uL — AB (ref 4.0–10.5)

## 2014-07-30 LAB — COMPREHENSIVE METABOLIC PANEL
ALT: 26 U/L (ref 17–63)
AST: 37 U/L (ref 15–41)
Albumin: 4.2 g/dL (ref 3.5–5.0)
Alkaline Phosphatase: 72 U/L (ref 38–126)
Anion gap: 6 (ref 5–15)
BUN: 11 mg/dL (ref 6–20)
CO2: 25 mmol/L (ref 22–32)
CREATININE: 0.61 mg/dL (ref 0.61–1.24)
Calcium: 8.9 mg/dL (ref 8.9–10.3)
Chloride: 105 mmol/L (ref 101–111)
GFR calc non Af Amer: >60 mL/min (ref >60)
GLUCOSE: 96 mg/dL (ref 65–99)
Potassium: 3.4 mmol/L — ABNORMAL LOW (ref 3.5–5.1)
Sodium: 136 mmol/L (ref 135–145)
TOTAL PROTEIN: 8 g/dL (ref 6.5–8.1)
Total Bilirubin: 4.7 mg/dL — ABNORMAL HIGH (ref 0.3–1.2)

## 2014-07-30 LAB — RETICULOCYTES
RBC.: 2.34 MIL/uL — AB (ref 4.22–5.81)
Retic Count, Absolute: 95.9 10*3/uL (ref 19.0–186.0)
Retic Ct Pct: 4.1 % — ABNORMAL HIGH (ref 0.4–3.1)

## 2014-07-30 LAB — LACTATE DEHYDROGENASE: LDH: 495 U/L — ABNORMAL HIGH (ref 98–192)

## 2014-07-30 MED ORDER — DIPHENHYDRAMINE HCL 12.5 MG/5ML PO ELIX: 12.5000 mg | ORAL_SOLUTION | Freq: Four times a day (QID) | ORAL | Status: DC | PRN

## 2014-07-30 MED ORDER — SODIUM CHLORIDE 0.9 % IV SOLN
12.5000 mg | Freq: Four times a day (QID) | INTRAVENOUS | Status: DC | PRN
  Filled 2014-07-30: qty 0.25

## 2014-07-30 MED ORDER — SODIUM CHLORIDE 0.9 % IJ SOLN
10.0000 mL | INTRAMUSCULAR | Status: AC | PRN
  Administered 2014-07-30: 10 mL

## 2014-07-30 MED ORDER — ENOXAPARIN SODIUM 120 MG/0.8ML ~~LOC~~ SOLN: 1.5000 mg/kg | Freq: Two times a day (BID) | SUBCUTANEOUS | Status: DC

## 2014-07-30 MED ORDER — HYDROMORPHONE 2 MG/ML HIGH CONCENTRATION IV PCA SOLN
INTRAVENOUS | Status: DC
  Administered 2014-07-30: 11:00:00 via INTRAVENOUS
  Administered 2014-07-30: 9 mg via INTRAVENOUS
  Filled 2014-07-30 (×2): qty 25

## 2014-07-30 MED ORDER — SODIUM CHLORIDE 0.9 % IJ SOLN: 9.0000 mL | INTRAMUSCULAR | Status: DC | PRN

## 2014-07-30 MED ORDER — ONDANSETRON HCL 4 MG/2ML IJ SOLN: 4.0000 mg | Freq: Four times a day (QID) | INTRAMUSCULAR | Status: DC | PRN

## 2014-07-30 MED ORDER — NALOXONE HCL 0.4 MG/ML IJ SOLN: 0.4000 mg | INTRAMUSCULAR | Status: DC | PRN

## 2014-07-30 MED ORDER — KETOROLAC TROMETHAMINE 30 MG/ML IJ SOLN
30.0000 mg | Freq: Once | INTRAMUSCULAR | Status: AC
  Administered 2014-07-30: 30 mg via INTRAVENOUS
  Filled 2014-07-30: qty 1

## 2014-07-30 MED ORDER — DEXTROSE-NACL 5-0.45 % IV SOLN
INTRAVENOUS | Status: DC
  Administered 2014-07-30: 10:00:00 via INTRAVENOUS

## 2014-07-30 MED ORDER — FOLIC ACID 1 MG PO TABS
1.0000 mg | ORAL_TABLET | Freq: Every day | ORAL | Status: DC
  Administered 2014-07-30: 1 mg via ORAL
  Filled 2014-07-30: qty 1

## 2014-07-30 MED ORDER — HEPARIN SOD (PORK) LOCK FLUSH 100 UNIT/ML IV SOLN
500.0000 [IU] | INTRAVENOUS | Status: AC | PRN
  Administered 2014-07-30: 500 [IU]
  Filled 2014-07-30: qty 5

## 2014-07-30 MED ORDER — ENOXAPARIN SODIUM 120 MG/0.8ML ~~LOC~~ SOLN
110.0000 mg | SUBCUTANEOUS | Status: DC
  Administered 2014-07-30: 110 mg via SUBCUTANEOUS
  Filled 2014-07-30 (×2): qty 0.8

## 2014-07-30 NOTE — Telephone Encounter (Signed)
Spoke with Dr. Doreene Burke.  Advised patient that he can come to [facility].  Patient verbalizes understanding.

## 2014-07-30 NOTE — Progress Notes (Signed)
Pt stated his pain is now 4, Dr. Doreene Burke notified and orders given to discharge patients. Pt denies shortness of breathe or chest pain. Porta cath flushed with saline and heparin and deaccessed. Pt in no acute distress. Discharged to home per ambulation.

## 2014-07-30 NOTE — H&P (Signed)
[facility] History and Physical  [name] HOZ:[id number] DOB: [birth year] DOA: 07/30/2014   PCP: [name],[name], MD    Chief Complaint: Pain, legs and back x 2 days  HPI: [name] is a 34 y.o. male with known history of sickle cell disease who was recently diagnosed with pulmonary embolism and started on subcutaneous Lovenox 110 mg daily, came [facility] the day Hospital today complaining of sickle cell pain in both legs and back. He he rated his pain at 8 out of 10 in his legs and back. He denied any nausea vomiting or diarrhea. Denied any chest pain or shortness of breath. Here the day hospital his oxygen saturation has been 100% on room air. Patient claimed he has not started the Lovenox as prescribed.  ROS: General: The patient denies anorexia, fever, weight loss Cardiac: No chest pain, syncope, palpitations, pedal edema  Respiratory: Denies cough, shortness of breath, wheezing GI: Denies severe indigestion/heartburn, abdominal pain, nausea, vomiting, diarrhea and constipation GU: Denies hematuria, incontinence, dysuria  Musculoskeletal: Denies arthritis  Skin: Denies suspicious skin lesions Neurologic: Denies focal weakness or numbness, change in vision Psychiatry: Denies depression or anxiety. Hematologic: no easy bruising or bleeding  All other systems reviewed and found to be negative.  Past Medical History  Diagnosis Date  . Sickle cell anemia   . Blood transfusion   . Acute embolism and thrombosis of right internal jugular vein   . Hypokalemia   . Mood disorder   . History of pulmonary embolus (PE)   . Avascular necrosis   . Leukocytosis     Chronic  . Thrombocytosis     Chronic  . Hypertension   . History of Clostridium difficile infection   . Uses marijuana   . Chronic anticoagulation   . Functional asplenia   . Former smoker   . Second hand tobacco smoke exposure   . Alcohol consumption of one to four drinks per day   . Noncompliance  with medication regimen   . Sickle-cell crisis with associated acute chest syndrome 05/13/2013  . Acute chest syndrome 06/18/2013  . Demand ischemia 01/02/2014    Past Surgical History  Procedure Laterality Date  . Right hip replacement      08/2006  . Cholecystectomy      01/2008  . Porta cath placement    . Porta cath removal    . Umbilical hernia repair      01/2008  . Excision of left periauricular cyst      10/2009  . Excision of right ear lobe cyst with primary closur      11/2007  . Portacath placement  01/05/2012    Procedure: INSERTION PORT-A-CATH;  Surgeon: Odis Hollingshead, MD;  Location: [place];  Service: General;  Laterality: N/A;  ultrasound guiced port a cath insertion with fluoroscopy     Prior to Admission medications   Medication Sig Start Date End Date Taking? Authorizing Provider  diltiazem (CARDIZEM CD) 120 MG 24 hr capsule Take 1 capsule (120 mg total) by mouth daily. 03/13/14  Yes Costin Karlyne Greenspan, MD  folic acid (FOLVITE) 1 MG tablet Take 1 tablet (1 mg total) by mouth every morning. 10/08/13  Yes [name], MD  HYDROmorphone (DILAUDID) 4 MG tablet Take 1 tablet (4 mg total) by mouth every 4 (four) hours as needed for severe pain. 07/07/14  Yes Dorena Dew, FNP  hydroxyurea (HYDREA) 500 MG capsule Take 3 capsules (1,500 mg total) by mouth daily. May  take with food to minimize GI side effects. 10/30/13  Yes [name], MD  lisinopril (PRINIVIL,ZESTRIL) 10 MG tablet Take 1 tablet (10 mg total) by mouth daily. 01/22/14  Yes [name], MD  metoprolol tartrate (LOPRESSOR) 25 MG tablet Take 1 tablet (25 mg total) by mouth daily. 08/22/13  Yes [name], MD  morphine (MS CONTIN) 30 MG 12 hr tablet Take 1 tablet (30 mg total) by mouth every 12 (twelve) hours. 07/07/14  Yes Dorena Dew, FNP  potassium chloride SA (K-DUR,KLOR-CON) 20 MEQ tablet Take 1 tablet (20 mEq total) by mouth every morning. 06/09/14  Yes [name], MD   zolpidem (AMBIEN) 10 MG tablet Take 1 tablet (10 mg total) by mouth at bedtime as needed for sleep. 07/09/14  Yes Dorena Dew, FNP  aspirin 81 MG chewable tablet Chew 1 tablet (81 mg total) by mouth daily. 07/23/14   [name], MD  Cholecalciferol (VITAMIN D) 2000 UNITS tablet Take 1 tablet (2,000 Units total) by mouth daily. Patient not taking: Reported on 07/07/2014 02/13/14   [name], MD  enoxaparin (LOVENOX) 150 MG/ML injection Inject 0.73 mLs (110 mg total) into the skin daily. Starting tonight 07/23/2014 at 10:00pm 07/23/14   [name], MD     Physical Exam: Filed Vitals:   07/30/14 0900  BP: 114/72  Pulse: 74  Temp: 98.8 F (37.1 C)  TempSrc: Oral  Resp: 16  SpO2: 100%   General: Young man, afebrile, not in acute distress HEENT: Normocephalic and Atraumatic, Mucous membranes pink                PERRLA; EOM intact; No scleral icterus,                 Nares: Patent, Oropharynx: Clear, Fair Dentition                 Neck: FROM, no cervical lymphadenopathy, thyromegaly, carotid bruit or JVD;  CHEST WALL: No tenderness  CHEST: Normal respiration, clear to auscultation bilaterally  HEART: Regular rate and rhythm; ? Murmur, no rubs or gallops  GI: Positive Bowel Sounds, soft, non-tender; no masses, no organomegaly MSK: No cyanosis, clubbing, or edema SKIN:  no rash or ulceration  CNS: Alert and Oriented x 4, Nonfocal exam, CN 2-12 intact  Labs on Admission:  Basic Metabolic Panel:  Recent Labs Lab 07/23/14 1145  NA 136  K 4.2  CL 107  CO2 23  GLUCOSE 102*  BUN 7  CREATININE 0.53*  CALCIUM 8.7*  MG 1.7   Liver Function Tests: No results for input(s): AST, ALT, ALKPHOS, BILITOT, PROT, ALBUMIN in the last 168 hours. No results for input(s): LIPASE, AMYLASE in the last 168 hours. No results for input(s): AMMONIA in the last 168 hours. CBC:  Recent Labs Lab 07/23/14 1145  WBC 14.4*  NEUTROABS 9.6*  HGB 8.3*  HCT 24.6*  MCV 87.9   PLT 435*   Cardiac Enzymes: No results for input(s): CKTOTAL, CKMB, CKMBINDEX, TROPONINI in the last 168 hours.  BNP (last 3 results) No results for input(s): BNP in the last 8760 hours.  ProBNP (last 3 results)  Recent Labs  01/01/14 0909  PROBNP 5790.0*     Assessment/Plan Active Problems:   Sickle-cell disease with pain Pulmonary Embolism on Lovenox  Admit the patient to the [facility] and will be treated with weight-based Dilaudid PCA, IV fluid, IV Toradol and continue home medications.Patient will be re-evaluated for pain in  the context of function and relationship to baseline as care progresses.    Consulted: None Code Status: Full Family Communication: None DVT Prophylaxis: Patient will be re-started on full dose Lovenox for PE.  Time spent: 75 min  Camber Ninh, MD  07/30/2014, 10:22 AM

## 2014-07-30 NOTE — Progress Notes (Addendum)
Pt arrived to [facility] per ambulation. C/o pain in his left and right rib cage and right arm. No c/o of chest pain or shortness of breathe. And no pain with breathing. Dr. Doreene Burke notified that patient is here  States pain is 7 out of 10. Porta cath accessed.

## 2014-07-30 NOTE — Telephone Encounter (Signed)
Patient C/O pain to both rib cage areas.  Patient rates pain 7/10 on pain scale.  Patient denies shortness of breath, chest pain, fever, or abdominal pain.  Patient is requesting to come to [facility].  I advised I would notify the physician and give him a call back.  Patient verbalizes understanding.

## 2014-07-31 IMAGING — XA IR FLUORO GUIDE CV LINE*R*
1 series · 1 of 1 positions shown · non-contrast
Comparison: none

CLINICAL DATA: Sickle cell anemia, needs venous access for
transfusion

PICC PLACEMENT WITH ULTRASOUND AND FLUOROSCOPY
TECHNIQUE: After written informed consent was obtained, patient was
placed in the supine position on angiographic table. Patency of the
right brachial vein was confirmed with ultrasound with image
documentation. An appropriate skin site was determined. Skin site
was marked. Region was prepped using maximum barrier technique
including cap and mask, sterile gown, sterile gloves, large sterile
sheet, and Chlorhexidine   as cutaneous antisepsis.  The region was
infiltrated locally with 1% lidocaine.   Under real-time ultrasound
guidance, the right brachial vein was accessed with a 21 gauge
micropuncture needle; the needle tip within the vein was confirmed
with ultrasound image documentation.   Needle exchanged over a 018
guidewire for a peel-away sheath, through which a 5-French double-
lumen power injectable PICC trimmed to 41cm was advanced,
positioned with its tip near the cavoatrial junction.  Spot chest
radiograph confirms appropriate catheter position.  Catheter was
flushed per protocol and secured externally with 0-Prolene sutures.
The patient tolerated procedure well, with no immediate
complication.

[Series 300: ir fluoro guide cv line*r* · 1 of 1 slices shown]
[im 1/1]
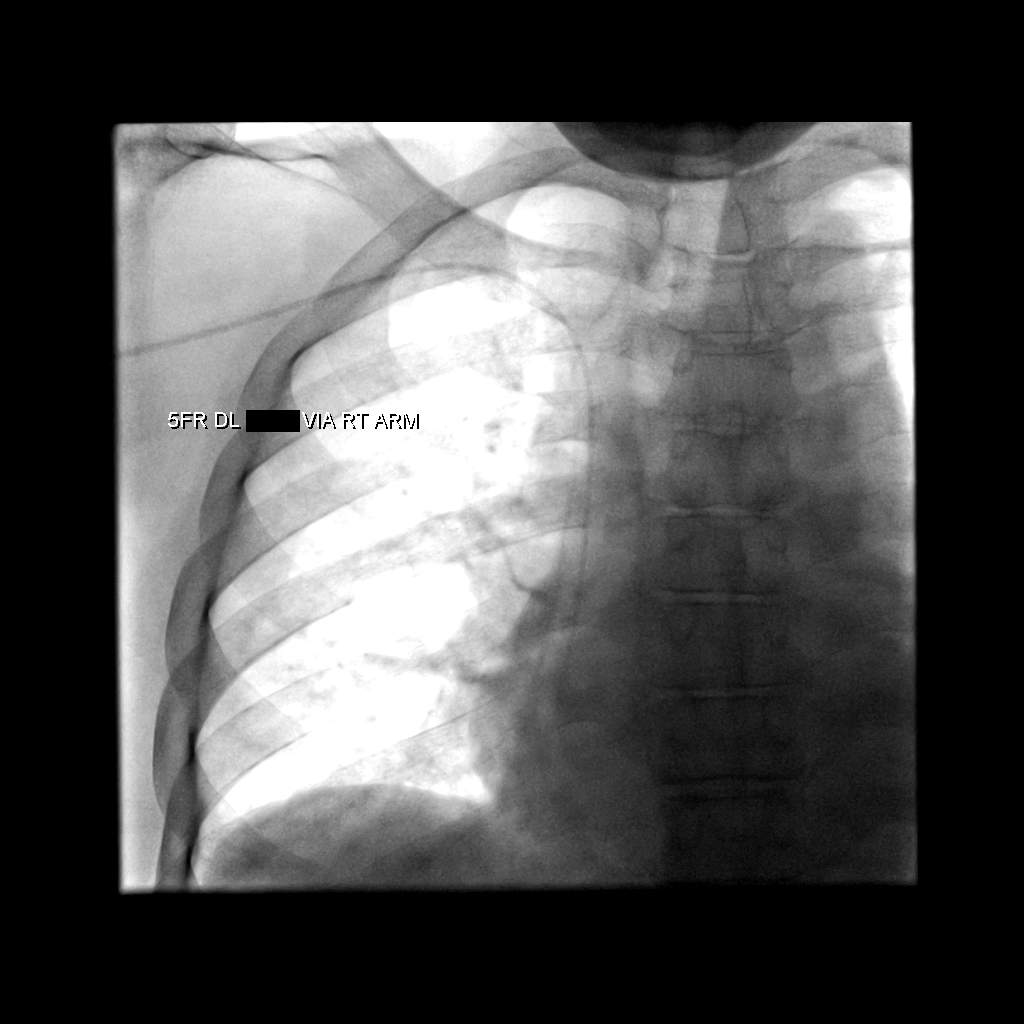

[1 of 1 positions shown; findings below may reference images not displayed]

IMPRESSION: Technically successful five French double lumen power injectable
PICC placement

## 2014-07-31 NOTE — Discharge Summary (Signed)
Physician Discharge Summary  [name] CXK:[id number] DOB: 10/12/1980 DOA: 07/30/2014  PCP: [name],[name], MD  Admit date: 07/30/2014 Discharge date: 07/30/2014 Time spent: 30 minutes  Recommendations for Outpatient Follow-up:  1. Lovenox injection subcut and referral for chronic anticoagulation   Discharge Diagnoses:  Active Problems:   Sickle-cell disease with pain   Discharge Condition: Stable Diet recommendation: Regular  There were no vitals filed for this visit.  History of present illness:  [name] is a 34 y.o. male with known history of sickle cell disease who was recently diagnosed with pulmonary embolism and started on subcutaneous Lovenox 110 mg daily, came to the day Hospital today complaining of sickle cell pain in both legs and back. He he rated his pain at 8 out of 10 in his legs and back. He denied any nausea vomiting or diarrhea. Denied any chest pain or shortness of breath. Here the day hospital his oxygen saturation has been 100% on room air. Patient claimed he has not started the Lovenox as prescribed.   Hospital Course:  Pt was treated with Dilaudid PCA, IVF, and Toradol. His pain improved from 8 out of 10 to 4 out of 10, Thus he was discharged home, to continue home pain management. He was also given 1 dose of Lovenox while in the day hospital. Pt was advised to restart subcutaneous Lovenox as prescribed, he will be given a referral to hematologist for chronic anticoagulation, return to clinic in 2 days to have a follow-up.  Discharge Exam: Filed Vitals:   07/30/14 1441  BP: 126/69  Pulse: 89  Temp: 98.6 F (37 C)  Resp: 20   General: Alert, awake, oriented x3, in no acute distress.  HEENT: [place]/AT PEERL, EOMI, nl oropharynx Neck: Trachea midline, no masses, no thyromegal,y no JVD, no carotid bruit OROPHARYNX: Moist, No exudate/ erythema/lesions.  Heart: Regular rate and rhythm, ? murmurs, no rubs, gallops Lungs: Clear to  auscultation, no wheezing or rhonchi noted.  Abdomen: Soft, nontender, nondistended, positive bowel sounds, no masses no hepatosplenomegaly noted. Neuro: No focal neurological deficits noted cranial nerves II through XII grossly intact. Nl baseline gait MSK: no warmth, swelling, or erythema of joints Extremities: mild clubbing of fingers, no cyanosis, left ankle and foot edema   Discharge Instructions    Current Discharge Medication List    CONTINUE these medications which have NOT CHANGED   Details  diltiazem (CARDIZEM CD) 120 MG 24 hr capsule Take 1 capsule (120 mg total) by mouth daily. Qty: 30 capsule, Refills: 1    folic acid (FOLVITE) 1 MG tablet Take 1 tablet (1 mg total) by mouth every morning. 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 Qty: 90 tablet, Refills: 0   Associated Diagnoses: Hb-SS disease with crisis    hydroxyurea (HYDREA) 500 MG capsule Take 3 capsules (1,500 mg total) by mouth daily. May take with food to minimize GI side effects. Qty: 60 capsule, Refills: 3    lisinopril (PRINIVIL,ZESTRIL) 10 MG tablet Take 1 tablet (10 mg total) by mouth daily. Qty: 30 tablet, Refills: 1   Associated Diagnoses: Hb-SS disease without crisis; Essential hypertension    metoprolol tartrate (LOPRESSOR) 25 MG tablet Take 1 tablet (25 mg total) by mouth daily. Qty: 30 tablet, Refills: 6   Associated Diagnoses: Essential hypertension    morphine (  MS CONTIN) 30 MG 12 hr tablet Take 1 tablet (30 mg total) by mouth every 12 (twelve) hours. Qty: 60 tablet, Refills: 0   Associated Diagnoses: Chronic pain syndrome    potassium chloride SA (K-DUR,KLOR-CON) 20 MEQ tablet Take 1 tablet (20 mEq total) by mouth every morning. Qty: 30 tablet, Refills: 3    zolpidem (AMBIEN) 10 MG tablet Take 1 tablet (10 mg total) by mouth at  bedtime as needed for sleep. Qty: 30 tablet, Refills: 0   Associated Diagnoses: Insomnia    aspirin 81 MG chewable tablet Chew 1 tablet (81 mg total) by mouth daily. Qty: 30 tablet, Refills: 11    Cholecalciferol (VITAMIN D) 2000 UNITS tablet Take 1 tablet (2,000 Units total) by mouth daily. Qty: 90 tablet, Refills: 3   Associated Diagnoses: Vitamin D deficiency    enoxaparin (LOVENOX) 150 MG/ML injection Inject 0.73 mLs (110 mg total) into the skin daily. Starting tonight 07/23/2014 at 10:00pm Qty: 22 Syringe, Refills: 5       No Known Allergies    The results of significant diagnostics from this hospitalization (including imaging, microbiology, ancillary and laboratory) are listed below for reference.    Significant Diagnostic Studies: Dg Chest 2 View  07/20/2014   CLINICAL DATA:  Chest pain for 1 day.  Sickle cell.  EXAM: CHEST  2 VIEW  COMPARISON:  07/14/2014  FINDINGS: Prominent cardiomegaly. Left subclavian Port-A-Cath device with its tip at the cavoatrial junction is stable. Interstitial changes towards lung bases are stable. No pneumothorax. No pleural effusion. No consolidation  IMPRESSION: Cardiomegaly and chronic basilar interstitial changes.   Electronically Signed   By: Marybelle Killings M.D.   On: 07/20/2014 10:12   Dg Chest 2 View  07/14/2014   CLINICAL DATA:  Sickle cell pain crisis. Mid to left upper back pain. Weakness. Started several hours ago.  EXAM: CHEST  2 VIEW  COMPARISON:  06/25/2014  FINDINGS: Cardiac enlargement with patchy interstitial changes in the lungs similar to previous study. No focal consolidation or airspace disease. No blunting of costophrenic angles. No pneumothorax. Power port type central venous catheter with tip over the cavoatrial junction.  IMPRESSION: Cardiac enlargement with stable patchy interstitial changes in the lungs. No acute infiltration.   Electronically Signed   By: Lucienne Capers M.D.   On: 07/14/2014 06:09   Ct Angio Chest Pe W/cm  &/or Wo Cm  07/20/2014   CLINICAL DATA:  Chest pain, sickle cell crisis  EXAM: CT ANGIOGRAPHY CHEST WITH CONTRAST  TECHNIQUE: Multidetector CT imaging of the chest was performed using the standard protocol during bolus administration of intravenous contrast. Multiplanar CT image reconstructions and MIPs were obtained to evaluate the vascular anatomy.  CONTRAST:  151mL OMNIPAQUE IOHEXOL 350 MG/ML SOLN  COMPARISON:  07/20/2014 chest radiograph, chest CT 03/19/2014  FINDINGS: Mediastinum/Nodes: Left-sided Port-A-Cath in place with tip in the right atrium. Cardiomegaly noted. Right atrial prominence. No pericardial effusion. AP window lymphadenopathy persists, largest 1.0 cm image 32. Representative pretracheal node measures 0.7 cm image 37.  Pulmonary arterial dilation is reidentified measuring 4.0 cm maximal transverse diameter image 121 series 6. The examination is adequate for evaluation for acute pulmonary embolism up to and including the 3rd order pulmonary arteries. Multiple bilateral lower lobe filling defects are identified compatible with pulmonary embolism, for example image 178 series 6. Allowing for motion artifact, there is no evidence for right heart strain as by calculated RV: LV ratio of 0.8.  Lungs/Pleura: Areas of mosaic type per fusion are  identified bilaterally, likely related to pulmonary emboli. Curvilinear bilateral lower lobe scarring or atelectasis is noted.  Upper abdomen: Normal  Musculoskeletal: Inhomogeneous trabecular pattern of the vertebral bodies is compatible with sickle cell deformity. No acute osseous abnormality.  Review of the MIP images confirms the above findings.  IMPRESSION: Small to moderate overall clot burden with bilateral predominantly lower lobe pulmonary emboli. No CT evidence for right heart strain.  Cardiomegaly.  Mosaic type perfusion pattern within the lung parenchyma compatible with pulmonary emboli.  Critical Value/emergent results were called by telephone at the  time of interpretation on 07/20/2014 at 11:58 am to Dr. Milton Ferguson , who verbally acknowledged these results.   Electronically Signed   By: Conchita Paris M.D.   On: 07/20/2014 12:00    Microbiology: No results found for this or any previous visit (from the past 240 hour(s)).   Labs: Basic Metabolic Panel:  Recent Labs Lab 07/30/14 1012  NA 136  K 3.4*  CL 105  CO2 25  GLUCOSE 96  BUN 11  CREATININE 0.61  CALCIUM 8.9   Liver Function Tests:  Recent Labs Lab 07/30/14 1012  AST 37  ALT 26  ALKPHOS 72  BILITOT 4.7*  PROT 8.0  ALBUMIN 4.2   No results for input(s): LIPASE, AMYLASE in the last 168 hours. No results for input(s): AMMONIA in the last 168 hours. CBC:  Recent Labs Lab 07/30/14 1012  WBC 12.9*  NEUTROABS 8.6*  HGB 7.2*  HCT 21.5*  MCV 91.9  PLT 632*  Signed:  [name] MD

## 2014-08-03 ENCOUNTER — Encounter (HOSPITAL_COMMUNITY): Payer: Self-pay

## 2014-08-03 ENCOUNTER — Emergency Department (HOSPITAL_COMMUNITY): Payer: Medicare Other

## 2014-08-03 ENCOUNTER — Emergency Department (HOSPITAL_COMMUNITY)
Admission: EM | Admit: 2014-08-03 | Discharge: 2014-08-03 | Disposition: A | Discharge status: Home / Self Care | Payer: Medicare Other | Source: Home / Self Care | Attending: Emergency Medicine | Admitting: Emergency Medicine

## 2014-08-03 DIAGNOSIS — D72829 Elevated white blood cell count, unspecified: Secondary | ICD-10-CM | POA: Diagnosis present

## 2014-08-03 DIAGNOSIS — I272 Other secondary pulmonary hypertension: Secondary | ICD-10-CM | POA: Diagnosis not present

## 2014-08-03 DIAGNOSIS — D638 Anemia in other chronic diseases classified elsewhere: Secondary | ICD-10-CM | POA: Diagnosis not present

## 2014-08-03 DIAGNOSIS — Z86718 Personal history of other venous thrombosis and embolism: Secondary | ICD-10-CM

## 2014-08-03 DIAGNOSIS — D5700 Hb-SS disease with crisis, unspecified: Principal | ICD-10-CM | POA: Diagnosis present

## 2014-08-03 DIAGNOSIS — Z8619 Personal history of other infectious and parasitic diseases: Secondary | ICD-10-CM | POA: Insufficient documentation

## 2014-08-03 DIAGNOSIS — R0902 Hypoxemia: Secondary | ICD-10-CM | POA: Diagnosis present

## 2014-08-03 DIAGNOSIS — R002 Palpitations: Secondary | ICD-10-CM | POA: Diagnosis present

## 2014-08-03 DIAGNOSIS — Z8659 Personal history of other mental and behavioral disorders: Secondary | ICD-10-CM | POA: Insufficient documentation

## 2014-08-03 DIAGNOSIS — Z96641 Presence of right artificial hip joint: Secondary | ICD-10-CM | POA: Diagnosis present

## 2014-08-03 DIAGNOSIS — R809 Proteinuria, unspecified: Secondary | ICD-10-CM | POA: Diagnosis not present

## 2014-08-03 DIAGNOSIS — Z79899 Other long term (current) drug therapy: Secondary | ICD-10-CM

## 2014-08-03 DIAGNOSIS — R718 Other abnormality of red blood cells: Secondary | ICD-10-CM | POA: Diagnosis not present

## 2014-08-03 DIAGNOSIS — Z9049 Acquired absence of other specified parts of digestive tract: Secondary | ICD-10-CM | POA: Diagnosis present

## 2014-08-03 DIAGNOSIS — Z86711 Personal history of pulmonary embolism: Secondary | ICD-10-CM | POA: Insufficient documentation

## 2014-08-03 DIAGNOSIS — Z8739 Personal history of other diseases of the musculoskeletal system and connective tissue: Secondary | ICD-10-CM

## 2014-08-03 DIAGNOSIS — Z7982 Long term (current) use of aspirin: Secondary | ICD-10-CM | POA: Insufficient documentation

## 2014-08-03 DIAGNOSIS — Z7901 Long term (current) use of anticoagulants: Secondary | ICD-10-CM | POA: Diagnosis not present

## 2014-08-03 DIAGNOSIS — D571 Sickle-cell disease without crisis: Secondary | ICD-10-CM | POA: Diagnosis not present

## 2014-08-03 DIAGNOSIS — Z9119 Patient's noncompliance with other medical treatment and regimen: Secondary | ICD-10-CM | POA: Diagnosis present

## 2014-08-03 DIAGNOSIS — Z452 Encounter for adjustment and management of vascular access device: Secondary | ICD-10-CM | POA: Diagnosis not present

## 2014-08-03 DIAGNOSIS — Z87891 Personal history of nicotine dependence: Secondary | ICD-10-CM | POA: Insufficient documentation

## 2014-08-03 DIAGNOSIS — I10 Essential (primary) hypertension: Secondary | ICD-10-CM | POA: Diagnosis present

## 2014-08-03 DIAGNOSIS — D473 Essential (hemorrhagic) thrombocythemia: Secondary | ICD-10-CM | POA: Diagnosis present

## 2014-08-03 DIAGNOSIS — Z8639 Personal history of other endocrine, nutritional and metabolic disease: Secondary | ICD-10-CM | POA: Insufficient documentation

## 2014-08-03 DIAGNOSIS — D57219 Sickle-cell/Hb-C disease with crisis, unspecified: Secondary | ICD-10-CM | POA: Diagnosis not present

## 2014-08-03 DIAGNOSIS — D649 Anemia, unspecified: Secondary | ICD-10-CM | POA: Diagnosis not present

## 2014-08-03 DIAGNOSIS — Z9114 Patient's other noncompliance with medication regimen: Secondary | ICD-10-CM

## 2014-08-03 LAB — CBC WITH DIFFERENTIAL/PLATELET
Basophils Absolute: 0.2 10*3/uL — ABNORMAL HIGH (ref 0.0–0.1)
Basophils Relative: 1 % (ref 0–1)
EOS ABS: 0.5 10*3/uL (ref 0.0–0.7)
Eosinophils Relative: 3 % (ref 0–5)
HEMATOCRIT: 21.6 % — AB (ref 39.0–52.0)
HEMOGLOBIN: 7.2 g/dL — AB (ref 13.0–17.0)
LYMPHS ABS: 2.1 10*3/uL (ref 0.7–4.0)
LYMPHS PCT: 12 % (ref 12–46)
MCH: 31.4 pg (ref 26.0–34.0)
MCHC: 33.3 g/dL (ref 30.0–36.0)
MCV: 94.3 fL (ref 78.0–100.0)
MONO ABS: 2.1 10*3/uL — AB (ref 0.1–1.0)
Monocytes Relative: 12 % (ref 3–12)
NEUTROS PCT: 72 % (ref 43–77)
Neutro Abs: 13 10*3/uL — ABNORMAL HIGH (ref 1.7–7.7)
Platelets: 547 10*3/uL — ABNORMAL HIGH (ref 150–400)
RBC: 2.29 MIL/uL — ABNORMAL LOW (ref 4.22–5.81)
RDW: 21.3 % — ABNORMAL HIGH (ref 11.5–15.5)
WBC: 17.9 10*3/uL — AB (ref 4.0–10.5)

## 2014-08-03 LAB — RETICULOCYTES
RBC.: 2.29 MIL/uL — AB (ref 4.22–5.81)
Retic Count, Absolute: 416.8 10*3/uL — ABNORMAL HIGH (ref 19.0–186.0)
Retic Ct Pct: 18.2 % — ABNORMAL HIGH (ref 0.4–3.1)

## 2014-08-03 LAB — COMPREHENSIVE METABOLIC PANEL
ALBUMIN: 3.7 g/dL (ref 3.5–5.0)
ALK PHOS: 62 U/L (ref 38–126)
ALT: 23 U/L (ref 17–63)
ANION GAP: 8 (ref 5–15)
AST: 27 U/L (ref 15–41)
BUN: 10 mg/dL (ref 6–20)
CO2: 23 mmol/L (ref 22–32)
CREATININE: 0.64 mg/dL (ref 0.61–1.24)
Calcium: 8.9 mg/dL (ref 8.9–10.3)
Chloride: 109 mmol/L (ref 101–111)
GFR calc Af Amer: >60 mL/min (ref >60)
GFR calc non Af Amer: >60 mL/min (ref >60)
Glucose, Bld: 99 mg/dL (ref 65–99)
Potassium: 3.3 mmol/L — ABNORMAL LOW (ref 3.5–5.1)
Sodium: 140 mmol/L (ref 135–145)
Total Bilirubin: 4.6 mg/dL — ABNORMAL HIGH (ref 0.3–1.2)
Total Protein: 7.2 g/dL (ref 6.5–8.1)

## 2014-08-03 LAB — I-STAT CG4 LACTIC ACID, ED
LACTIC ACID, VENOUS: 1.39 mmol/L (ref 0.5–2.0)
Lactic Acid, Venous: 0.62 mmol/L (ref 0.5–2.0)

## 2014-08-03 IMAGING — US US ABDOMEN COMPLETE
1 series · 14 of 25 positions shown · non-contrast
Comparison: 01/11/2011

CLINICAL DATA: Jaundice, elevated bilirubin, acute sickle cell
crisis

COMPLETE ABDOMINAL ULTRASOUND

[Series 1: us abdomen complete · 0.30mm/px · 14 of 107 slices shown]
[im 1/107]
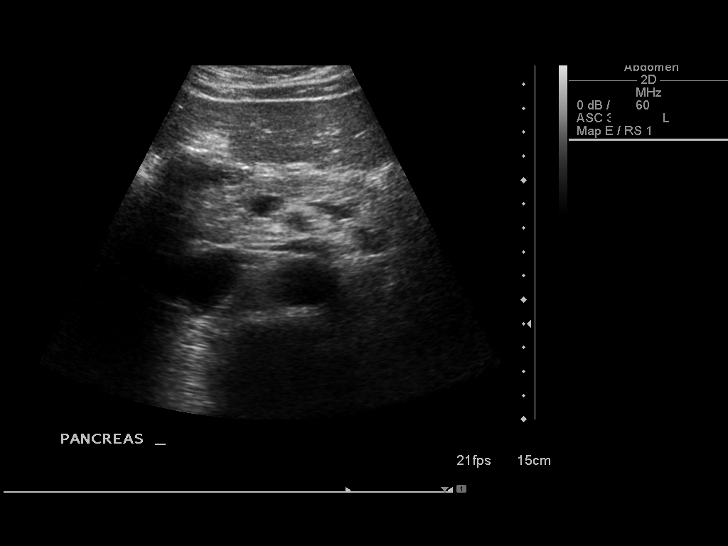
[im 9/107]
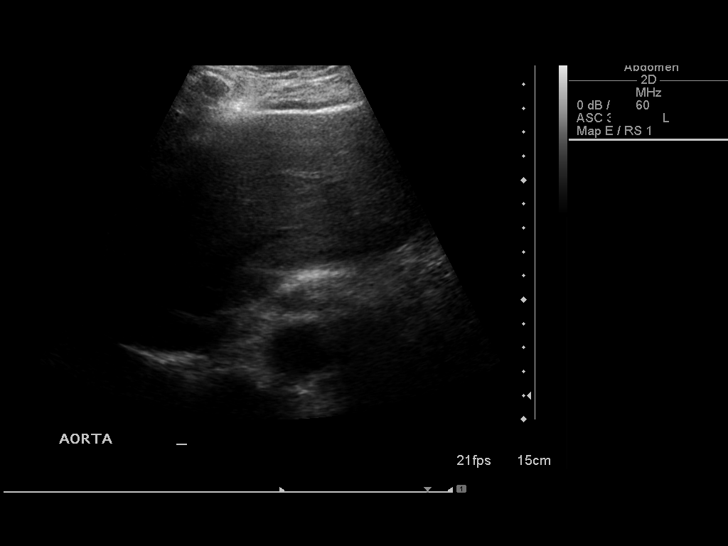
[im 18/107]
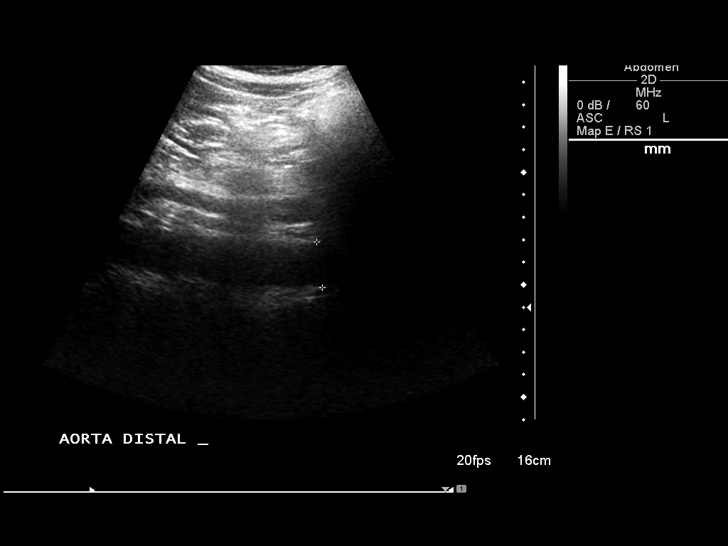
[im 27/107]
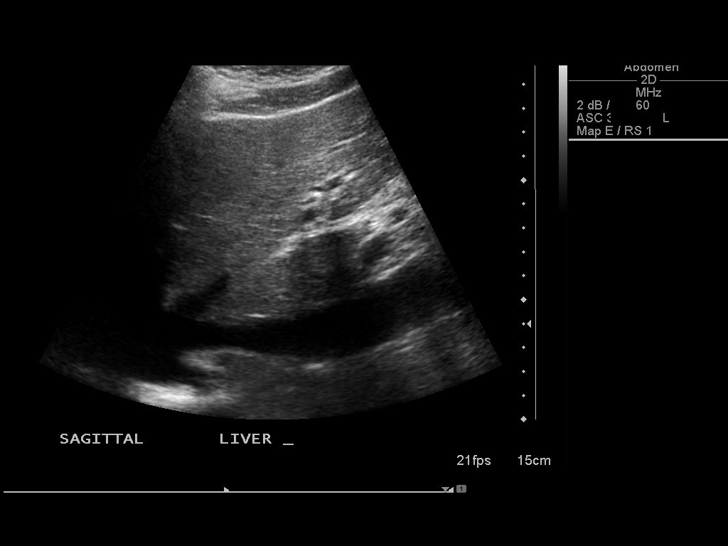
[im 36/107]
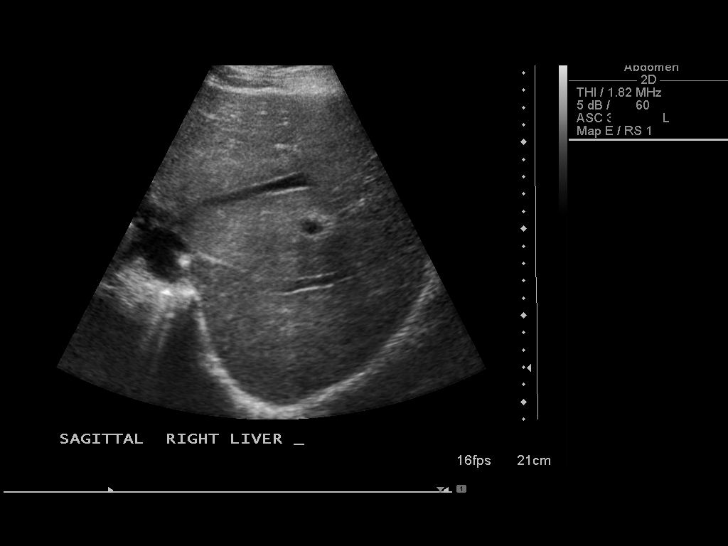
[im 40/107]
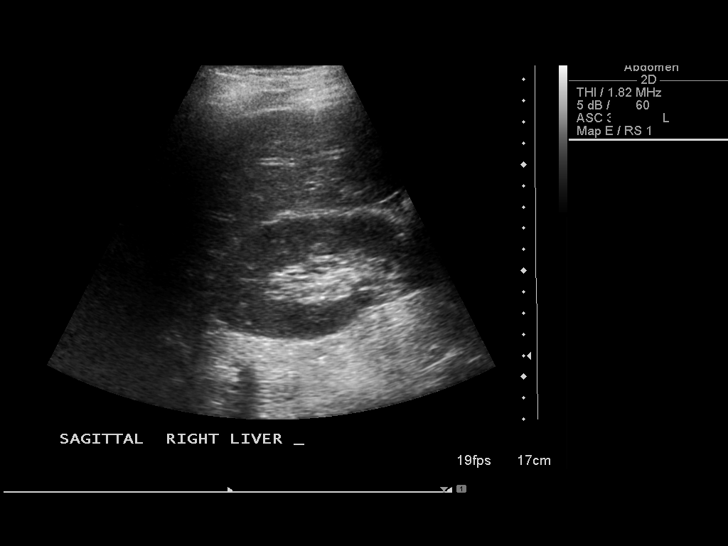
[im 49/107]
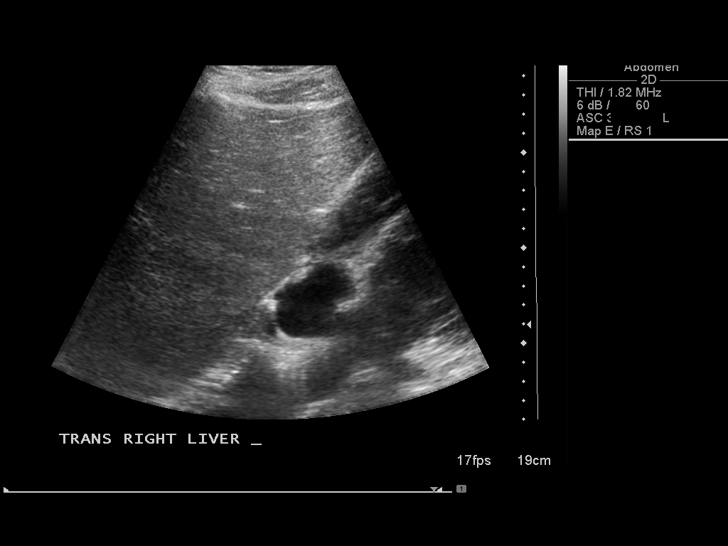
[im 58/107]
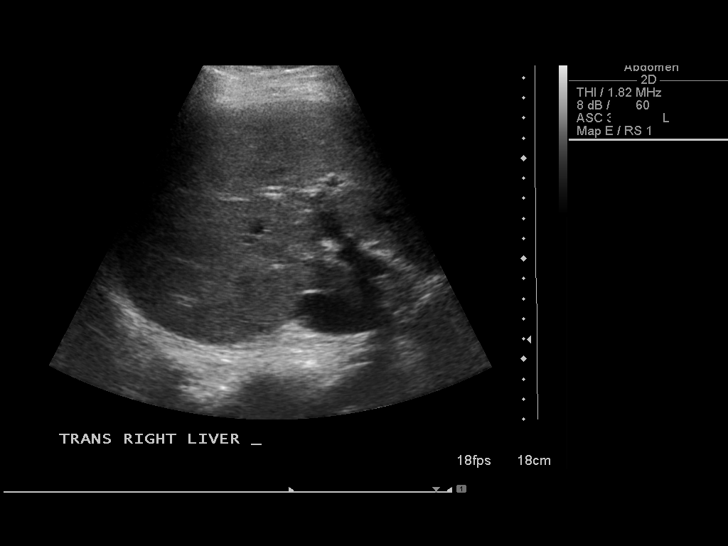
[im 67/107]
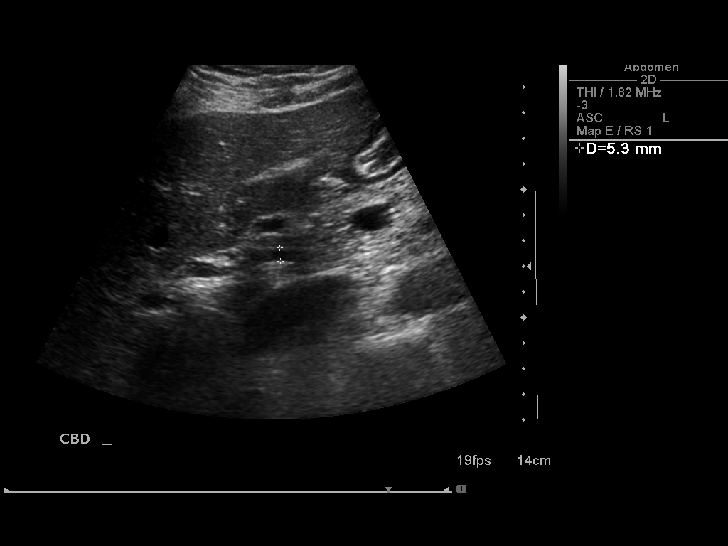
[im 71/107]
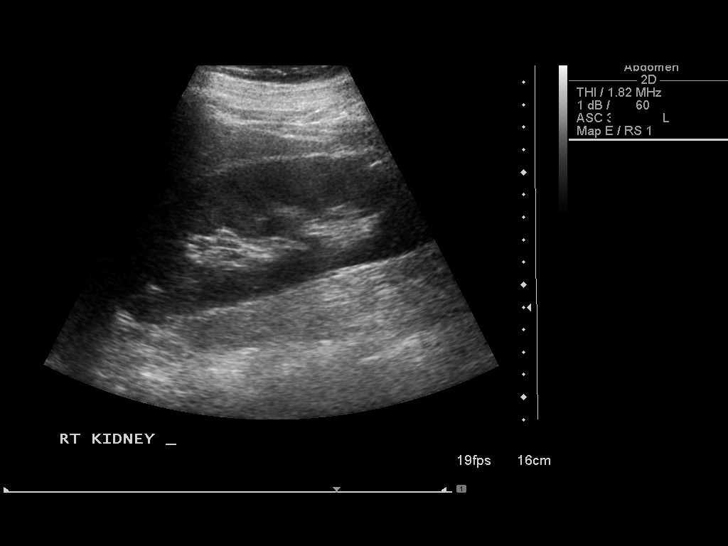
[im 80/107]
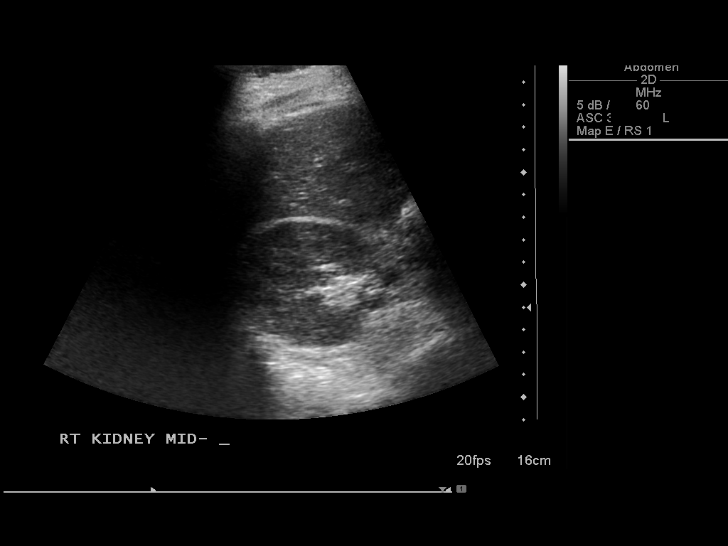
[im 89/107]
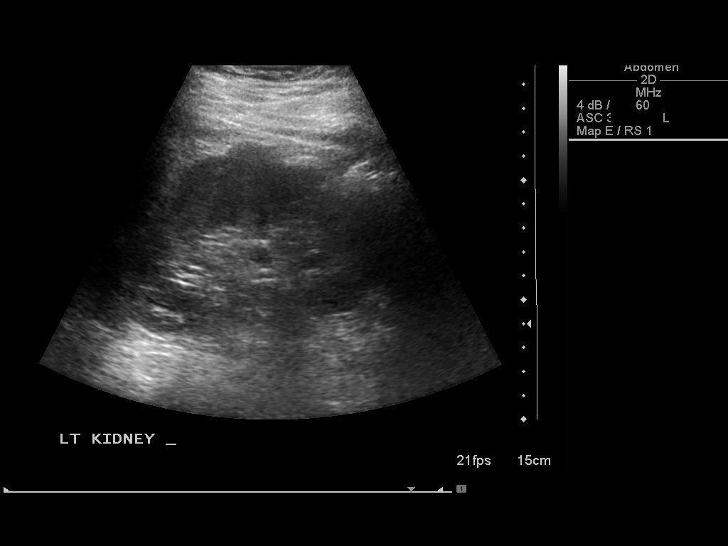
[im 98/107]
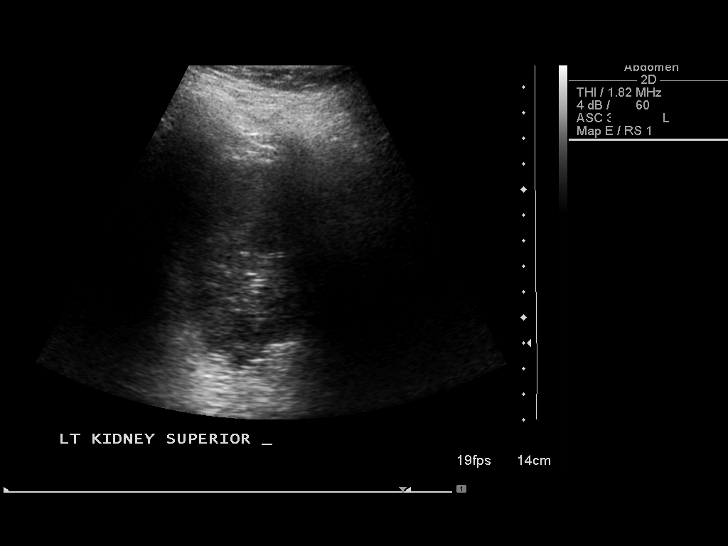
[im 107/107]
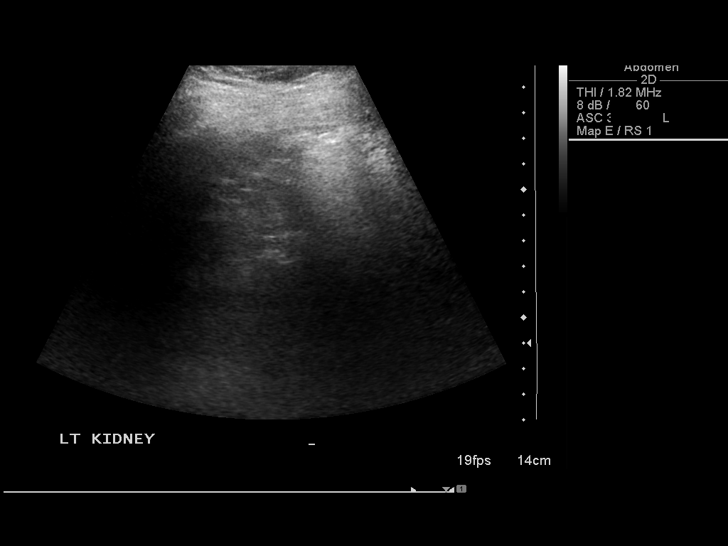

[14 of 25 positions shown; findings below may reference images not displayed]

FINDINGS: Gallbladder:  Surgically absent.

Common bile duct:  Measures 5 mm.

Liver:  No focal lesion identified.  Within normal limits in
parenchymal echogenicity.

IVC:  Appears normal.

Pancreas:  Incompletely visualized but grossly unremarkable.

Spleen:  Not visualized, possibly surgically absent.

Right Kidney:  Enlarged, measuring 15.0 cm.  9 mm hyperechoic
lesion in the right upper pole.  No hydronephrosis.

Left Kidney:  Enlarged, measuring 13.8 cm.  No mass or
hydronephrosis.

Abdominal aorta:  No aneurysm identified.
IMPRESSION: Status post cholecystectomy.  Suspected prior splenectomy.

9 mm hyperechoic lesion in the right upper kidney, nonspecific,
possibly reflecting focal fat or a small angiomyolipoma.

Otherwise negative abdominal ultrasound.

## 2014-08-03 MED ORDER — DIPHENHYDRAMINE HCL 50 MG/ML IJ SOLN
12.5000 mg | INTRAMUSCULAR | Status: DC | PRN
  Administered 2014-08-03: 12.5 mg via INTRAVENOUS
  Filled 2014-08-03 (×2): qty 1

## 2014-08-03 MED ORDER — HYDROMORPHONE HCL 2 MG/ML IJ SOLN
2.0000 mg | Freq: Once | INTRAMUSCULAR | Status: AC
  Administered 2014-08-03: 2 mg via INTRAVENOUS
  Filled 2014-08-03: qty 1

## 2014-08-03 MED ORDER — SODIUM CHLORIDE 0.9 % IV SOLN
1000.0000 mL | INTRAVENOUS | Status: DC
  Administered 2014-08-03: 1000 mL via INTRAVENOUS

## 2014-08-03 MED ORDER — HYDROMORPHONE HCL 2 MG/ML IJ SOLN
2.0000 mg | INTRAMUSCULAR | Status: AC | PRN
  Administered 2014-08-03 (×2): 2 mg via INTRAVENOUS
  Filled 2014-08-03 (×3): qty 1

## 2014-08-03 MED ORDER — HEPARIN SOD (PORK) LOCK FLUSH 100 UNIT/ML IV SOLN
500.0000 [IU] | Freq: Once | INTRAVENOUS | Status: AC
  Administered 2014-08-03: 500 [IU]
  Filled 2014-08-03: qty 5

## 2014-08-03 MED ORDER — SODIUM CHLORIDE 0.9 % IV SOLN
1000.0000 mL | Freq: Once | INTRAVENOUS | Status: AC
  Administered 2014-08-03: 1000 mL via INTRAVENOUS

## 2014-08-03 NOTE — Discharge Instructions (Signed)
Sickle Cell Anemia, Adult °Sickle cell anemia is a condition in which red blood cells have an abnormal "sickle" shape. This abnormal shape shortens the cells' life span, which results in a lower than normal concentration of red blood cells in the blood. The sickle shape also causes the cells to clump together and block free blood flow through the blood vessels. As a result, the tissues and organs of the body do not receive enough oxygen. Sickle cell anemia causes organ damage and pain and increases the risk of infection. °CAUSES  °Sickle cell anemia is a genetic disorder. Those who receive two copies of the gene have the condition, and those who receive one copy have the trait. °RISK FACTORS °The sickle cell gene is most common in people whose families originated in [place]. Other areas of the globe where sickle cell trait occurs include the [place], [place] and [place], the [place], and the [place].  °SIGNS AND SYMPTOMS °· Pain, especially in the extremities, back, chest, or abdomen (common). The pain may start suddenly or may develop following an illness, especially if there is dehydration. Pain can also occur due to overexertion or exposure to extreme temperature changes. °· Frequent severe bacterial infections, especially certain types of pneumonia and meningitis. °· Pain and swelling in the hands and feet. °· Decreased activity.   °· Loss of appetite.   °· Change in behavior. °· Headaches. °· Seizures. °· Shortness of breath or difficulty breathing. °· Vision changes. °· Skin ulcers. °Those with the trait may not have symptoms or they may have mild symptoms.  °DIAGNOSIS  °Sickle cell anemia is diagnosed with blood tests that demonstrate the genetic trait. It is often diagnosed during the newborn period, due to mandatory testing nationwide. A variety of blood tests, X-rays, CT scans, MRI scans, ultrasounds, and lung function tests may also be done to monitor the condition. °TREATMENT  °Sickle  cell anemia may be treated with: °· Medicines. You may be given pain medicines, antibiotic medicines (to treat and prevent infections) or medicines to increase the production of certain types of hemoglobin. °· Fluids. °· Oxygen. °· Blood transfusions. °HOME CARE INSTRUCTIONS  °· Drink enough fluid to keep your urine clear or pale yellow. Increase your fluid intake in hot weather and during exercise. °· Do not smoke. Smoking lowers oxygen levels in the blood.   °· Only take over-the-counter or prescription medicines for pain, fever, or discomfort as directed by your health care provider. °· Take antibiotics as directed by your health care provider. Make sure you finish them it even if you start to feel better.   °· Take supplements as directed by your health care provider.   °· Consider wearing a medical alert bracelet. This tells anyone caring for you in an emergency of your condition.   °· When traveling, keep your medical information, health care provider's names, and the medicines you take with you at all times.   °· If you develop a fever, do not take medicines to reduce the fever right away. This could cover up a problem that is developing. Notify your health care provider. °· Keep all follow-up appointments with your health care provider. Sickle cell anemia requires regular medical care. °SEEK MEDICAL CARE IF: ° You have a fever. °SEEK IMMEDIATE MEDICAL CARE IF:  °· You feel dizzy or faint.   °· You have new abdominal pain, especially on the left side near the stomach area.   °· You develop a persistent, often uncomfortable and painful penile erection (priapism). If this is not treated immediately it   will lead to impotence.   °· You have numbness your arms or legs or you have a hard time moving them.   °· You have a hard time with speech.   °· You have a fever or persistent symptoms for more than 2-3 days.   °· You have a fever and your symptoms suddenly get worse.   °· You have signs or symptoms of infection.  These include:   °¨ Chills.   °¨ Abnormal tiredness (lethargy).   °¨ Irritability.   °¨ Poor eating.   °¨ Vomiting.   °· You develop pain that is not helped with medicine.   °· You develop shortness of breath. °· You have pain in your chest.   °· You are coughing up pus-like or bloody sputum.   °· You develop a stiff neck. °· Your feet or hands swell or have pain. °· Your abdomen appears bloated. °· You develop joint pain. °MAKE SURE YOU: °· Understand these instructions. °Document Released: 04/27/2005 Document Revised: 06/03/2013 Document Reviewed: 08/29/2012 °[employer]® Patient Information ©2015 [employer]. This information is not intended to replace advice given to you by your health care provider. Make sure you discuss any questions you have with your health care provider. ° °

## 2014-08-03 NOTE — ED Provider Notes (Signed)
CSN: [id number]     Arrival date & time 08/03/14  0800 History   First MD Initiated Contact with Patient 08/03/14 [id number]     Chief Complaint  Patient presents with  . Sickle Cell Pain Crisis  . Ribcage Pain   . Back Pain     (Consider location/radiation/quality/duration/timing/severity/associated sxs/prior Treatment) Patient is a 34 y.o. male presenting with sickle cell pain and back pain. The history is provided by the patient.  Sickle Cell Pain Crisis Associated symptoms: headaches   Associated symptoms: no chest pain, no fatigue and no shortness of breath   Back Pain Associated symptoms: headaches   Associated symptoms: no abdominal pain and no chest pain    patient presents with sickle cell pain crisis. States he hurts in his neck and his chest and back. This is a typical pattern of his sickle cell pain. Has a history of pulmonary embolus on Xarelto and is now on Lovenox shots. No fevers. No trouble breathing. He is on morphine and Dilaudid at home. multiple prior visits for his sickle cell  Past Medical History  Diagnosis Date  . Sickle cell anemia   . Blood transfusion   . Acute embolism and thrombosis of right internal jugular vein   . Hypokalemia   . Mood disorder   . History of pulmonary embolus (PE)   . Avascular necrosis   . Leukocytosis     Chronic  . Thrombocytosis     Chronic  . Hypertension   . History of Clostridium difficile infection   . Uses marijuana   . Chronic anticoagulation   . Functional asplenia   . Former smoker   . Second hand tobacco smoke exposure   . Alcohol consumption of one to four drinks per day   . Noncompliance with medication regimen   . Sickle-cell crisis with associated acute chest syndrome 05/13/2013  . Acute chest syndrome 06/18/2013  . Demand ischemia 01/02/2014   Past Surgical History  Procedure Laterality Date  . Right hip replacement      08/2006  . Cholecystectomy      01/2008  . Porta cath placement    . Porta cath removal     . Umbilical hernia repair      01/2008  . Excision of left periauricular cyst      10/2009  . Excision of right ear lobe cyst with primary closur      11/2007  . Portacath placement  01/05/2012    Procedure: INSERTION PORT-A-CATH;  Surgeon: Odis Hollingshead, MD;  Location: [facility];  Service: General;  Laterality: N/A;  ultrasound guiced port a cath insertion with fluoroscopy   Family History  Problem Relation Age of Onset  . Sickle cell trait Mother   . Depression Mother   . Diabetes Mother   . Sickle cell trait Father   . Sickle cell trait Brother    History  Substance Use Topics  . Smoking status: Former Smoker -- 13 years    Quit date: 07/08/2010  . Smokeless tobacco: Never Used  . Alcohol Use: No    Review of Systems  Constitutional: Negative for chills and fatigue.  Respiratory: Negative for shortness of breath.   Cardiovascular: Negative for chest pain.  Gastrointestinal: Negative for abdominal pain.  Musculoskeletal: Positive for back pain and neck pain.  Skin: Negative for wound.  Neurological: Positive for headaches. Negative for seizures.      Allergies  Review of patient's allergies indicates no known allergies.  Home Medications  Prior to Admission medications   Medication Sig Start Date End Date Taking? Authorizing Provider  aspirin 81 MG chewable tablet Chew 1 tablet (81 mg total) by mouth daily. Patient not taking: Reported on 08/05/2014 07/23/14  Yes [name], MD  Cholecalciferol (VITAMIN D) 2000 UNITS tablet Take 1 tablet (2,000 Units total) by mouth daily. Patient not taking: Reported on 08/05/2014 02/13/14  Yes [name], MD  diltiazem (CARDIZEM CD) 120 MG 24 hr capsule Take 1 capsule (120 mg total) by mouth daily. 03/13/14  Yes Costin Karlyne Greenspan, MD  folic acid (FOLVITE) 1 MG tablet Take 1 tablet (1 mg total) by mouth every morning. 10/08/13  Yes [name], MD  HYDROmorphone (DILAUDID) 4 MG tablet Take 1 tablet (4 mg total)  by mouth every 4 (four) hours as needed for severe pain. 07/07/14  Yes Dorena Dew, FNP  hydroxyurea (HYDREA) 500 MG capsule Take 3 capsules (1,500 mg total) by mouth daily. May take with food to minimize GI side effects. 10/30/13  Yes [name], MD  lisinopril (PRINIVIL,ZESTRIL) 10 MG tablet Take 1 tablet (10 mg total) by mouth daily. 01/22/14  Yes [name], MD  metoprolol tartrate (LOPRESSOR) 25 MG tablet Take 1 tablet (25 mg total) by mouth daily. 08/22/13  Yes [name], MD  morphine (MS CONTIN) 30 MG 12 hr tablet Take 1 tablet (30 mg total) by mouth every 12 (twelve) hours. 07/07/14  Yes Dorena Dew, FNP  potassium chloride SA (K-DUR,KLOR-CON) 20 MEQ tablet Take 1 tablet (20 mEq total) by mouth every morning. 06/09/14  Yes [name], MD  zolpidem (AMBIEN) 10 MG tablet Take 1 tablet (10 mg total) by mouth at bedtime as needed for sleep. 07/09/14  Yes Dorena Dew, FNP  enoxaparin (LOVENOX) 150 MG/ML injection Inject 0.73 mLs (110 mg total) into the skin daily. Starting tonight 07/23/2014 at 10:00pm Patient not taking: Reported on 08/05/2014 07/23/14   [name], MD   BP 131/82 mmHg  Pulse 78  Temp(Src) 98.6 F (37 C) (Oral)  Resp 20  SpO2 94% Physical Exam  Constitutional: He appears well-developed.  HENT:  Head: Atraumatic.  Eyes: Pupils are equal, round, and reactive to light.  Neck:  Some tenderness in left paraspinal area.  Cardiovascular: Normal rate.   Pulmonary/Chest: Effort normal.  Abdominal: Soft. There is no tenderness.  Musculoskeletal: He exhibits tenderness. He exhibits no edema.  Neurological: He is alert.  Skin: Skin is warm.  Psychiatric: He has a normal mood and affect.    ED Course  Procedures (including critical care time) Labs Review Labs Reviewed  CBC WITH DIFFERENTIAL/PLATELET - Abnormal; Notable for the following:    WBC 17.9 (*)    RBC 2.29 (*)    Hemoglobin 7.2 (*)    HCT 21.6 (*)    RDW 21.3 (*)     Platelets 547 (*)    Neutro Abs 13.0 (*)    Monocytes Absolute 2.1 (*)    Basophils Absolute 0.2 (*)    All other components within normal limits  COMPREHENSIVE METABOLIC PANEL - Abnormal; Notable for the following:    Potassium 3.3 (*)    Total Bilirubin 4.6 (*)    All other components within normal limits  RETICULOCYTES - Abnormal; Notable for the following:    Retic Ct Pct 18.2 (*)    RBC. 2.29 (*)    Retic Count, Manual 416.8 (*)    All other components within normal  limits  I-STAT CG4 LACTIC ACID, ED  I-STAT CG4 LACTIC ACID, ED    Imaging Review Dg Chest Port 1 View  08/05/2014   CLINICAL DATA:  Hypoxia.  Sickle cell pain crisis  EXAM: PORTABLE CHEST - 1 VIEW  COMPARISON:  08/03/2014  FINDINGS: Chronic cardiomegaly. Aortic and hilar contours are stable and negative. Stable left subclavian porta catheter, tip at the upper right atrium.  Chronic reticulation of lung markings consistent with scarring. There is no edema, consolidation, effusion, or pneumothorax.  No acute osseous findings.  IMPRESSION: Stable exam.  No evidence of acute cardiopulmonary disease.   Electronically Signed   By: Monte Fantasia M.D.   On: 08/05/2014 06:08     EKG Interpretation   Date/Time:  Sunday August 03 2014 08:12:27 EDT Ventricular Rate:  78 PR Interval:  154 QRS Duration: 127 QT Interval:  421 QTC Calculation: 480 R Axis:   -104 Text Interpretation:  Sinus rhythm Paired ventricular premature complexes  Probable left atrial enlargement Nonspecific IVCD with LAD Inferior  infarct, old Abnrm T, consider ischemia, anterolateral lds ED PHYSICIAN  INTERPRETATION AVAILABLE IN [facility] [place] Confirmed by TEST, Record  ([id number]) on 08/04/2014 12:58:36 PM      MDM   Final diagnoses:  Sickle-cell disease with pain    Patient with acute on chronic sickle cell pain. Lab work at baseline. Feels better after treatment. Discharge home.    Davonna Belling, MD 08/05/14 0900

## 2014-08-03 NOTE — ED Notes (Signed)
Dillon Bjork, RN deaccessed patients port for discharge home.

## 2014-08-03 NOTE — ED Notes (Signed)
Nurse will draw blood from port. 

## 2014-08-03 NOTE — ED Notes (Signed)
Pt c/o Sickle Cell Crisis w/ pain to upper back and bilateral rib cage starting last night.  Pain score 8/10.  Pt reports taking all medications as prescribed.

## 2014-08-05 ENCOUNTER — Telehealth: Payer: Self-pay | Admitting: Family Medicine

## 2014-08-05 ENCOUNTER — Encounter (HOSPITAL_COMMUNITY): Payer: Self-pay | Admitting: *Deleted

## 2014-08-05 ENCOUNTER — Inpatient Hospital Stay (HOSPITAL_COMMUNITY)
Admission: EM | Admit: 2014-08-05 | Discharge: 2014-08-08 | DRG: 812 | Disposition: A | Discharge status: Home / Self Care | Payer: Medicare Other | Attending: Internal Medicine | Admitting: Internal Medicine

## 2014-08-05 ENCOUNTER — Emergency Department (HOSPITAL_COMMUNITY): Payer: Medicare Other

## 2014-08-05 DIAGNOSIS — D72829 Elevated white blood cell count, unspecified: Secondary | ICD-10-CM

## 2014-08-05 DIAGNOSIS — R002 Palpitations: Secondary | ICD-10-CM

## 2014-08-05 DIAGNOSIS — D638 Anemia in other chronic diseases classified elsewhere: Secondary | ICD-10-CM | POA: Diagnosis present

## 2014-08-05 DIAGNOSIS — D5700 Hb-SS disease with crisis, unspecified: Secondary | ICD-10-CM

## 2014-08-05 DIAGNOSIS — D649 Anemia, unspecified: Secondary | ICD-10-CM

## 2014-08-05 DIAGNOSIS — I272 Other secondary pulmonary hypertension: Secondary | ICD-10-CM

## 2014-08-05 DIAGNOSIS — Z7901 Long term (current) use of anticoagulants: Secondary | ICD-10-CM

## 2014-08-05 DIAGNOSIS — R0902 Hypoxemia: Secondary | ICD-10-CM

## 2014-08-05 LAB — MRSA PCR SCREENING: MRSA BY PCR: NEGATIVE

## 2014-08-05 LAB — CBC WITH DIFFERENTIAL/PLATELET
BASOS ABS: 0 10*3/uL (ref 0.0–0.1)
BASOS PCT: 0 % (ref 0–1)
Eosinophils Absolute: 0.6 10*3/uL (ref 0.0–0.7)
Eosinophils Relative: 3 % (ref 0–5)
HCT: 20.7 % — ABNORMAL LOW (ref 39.0–52.0)
Hemoglobin: 7 g/dL — ABNORMAL LOW (ref 13.0–17.0)
Lymphocytes Relative: 12 % (ref 12–46)
Lymphs Abs: 2.3 10*3/uL (ref 0.7–4.0)
MCH: 31.5 pg (ref 26.0–34.0)
MCHC: 33.8 g/dL (ref 30.0–36.0)
MCV: 93.2 fL (ref 78.0–100.0)
Monocytes Absolute: 2.3 10*3/uL — ABNORMAL HIGH (ref 0.1–1.0)
Monocytes Relative: 12 % (ref 3–12)
Neutro Abs: 14.2 10*3/uL — ABNORMAL HIGH (ref 1.7–7.7)
Neutrophils Relative %: 73 % (ref 43–77)
PLATELETS: 474 10*3/uL — AB (ref 150–400)
RBC: 2.22 MIL/uL — AB (ref 4.22–5.81)
RDW: 19.9 % — AB (ref 11.5–15.5)
WBC: 19.4 10*3/uL — ABNORMAL HIGH (ref 4.0–10.5)
nRBC: 1 /100 WBC — ABNORMAL HIGH

## 2014-08-05 LAB — MAGNESIUM: MAGNESIUM: 1.8 mg/dL (ref 1.7–2.4)

## 2014-08-05 LAB — URINALYSIS, ROUTINE W REFLEX MICROSCOPIC
GLUCOSE, UA: NEGATIVE mg/dL
Hgb urine dipstick: NEGATIVE
Ketones, ur: NEGATIVE mg/dL
LEUKOCYTES UA: NEGATIVE
Nitrite: NEGATIVE
PROTEIN: 30 mg/dL — AB
Specific Gravity, Urine: 1.014 (ref 1.005–1.030)
UROBILINOGEN UA: 1 mg/dL (ref 0.0–1.0)
pH: 6 (ref 5.0–8.0)

## 2014-08-05 LAB — RETICULOCYTES
RBC.: 2.22 MIL/uL — ABNORMAL LOW (ref 4.22–5.81)
RETIC COUNT ABSOLUTE: 417.4 10*3/uL — AB (ref 19.0–186.0)
Retic Ct Pct: 18.8 % — ABNORMAL HIGH (ref 0.4–3.1)

## 2014-08-05 LAB — URINE MICROSCOPIC-ADD ON

## 2014-08-05 LAB — COMPREHENSIVE METABOLIC PANEL
ALBUMIN: 3.6 g/dL (ref 3.5–5.0)
ALK PHOS: 68 U/L (ref 38–126)
ALT: 20 U/L (ref 17–63)
AST: 28 U/L (ref 15–41)
Anion gap: 5 (ref 5–15)
BUN: 10 mg/dL (ref 6–20)
CO2: 21 mmol/L — ABNORMAL LOW (ref 22–32)
CREATININE: 0.74 mg/dL (ref 0.61–1.24)
Calcium: 8.4 mg/dL — ABNORMAL LOW (ref 8.9–10.3)
Chloride: 113 mmol/L — ABNORMAL HIGH (ref 101–111)
GFR calc Af Amer: >60 mL/min (ref >60)
GFR calc non Af Amer: >60 mL/min (ref >60)
Glucose, Bld: 118 mg/dL — ABNORMAL HIGH (ref 65–99)
Potassium: 3.4 mmol/L — ABNORMAL LOW (ref 3.5–5.1)
Sodium: 139 mmol/L (ref 135–145)
TOTAL PROTEIN: 7.1 g/dL (ref 6.5–8.1)
Total Bilirubin: 4.4 mg/dL — ABNORMAL HIGH (ref 0.3–1.2)

## 2014-08-05 LAB — LACTATE DEHYDROGENASE: LDH: 338 U/L — ABNORMAL HIGH (ref 98–192)

## 2014-08-05 MED ORDER — HYDROMORPHONE HCL 2 MG/ML IJ SOLN
INTRAMUSCULAR | Status: AC
  Filled 2014-08-05: qty 1

## 2014-08-05 MED ORDER — KETOROLAC TROMETHAMINE 30 MG/ML IJ SOLN
30.0000 mg | Freq: Once | INTRAMUSCULAR | Status: AC
  Administered 2014-08-05: 30 mg via INTRAVENOUS
  Filled 2014-08-05: qty 1

## 2014-08-05 MED ORDER — SENNOSIDES-DOCUSATE SODIUM 8.6-50 MG PO TABS
1.0000 | ORAL_TABLET | Freq: Two times a day (BID) | ORAL | Status: DC
  Administered 2014-08-05 – 2014-08-08 (×6): 1 via ORAL
  Filled 2014-08-05 (×7): qty 1

## 2014-08-05 MED ORDER — ONDANSETRON HCL 4 MG/2ML IJ SOLN: 4.0000 mg | Freq: Four times a day (QID) | INTRAMUSCULAR | Status: DC | PRN

## 2014-08-05 MED ORDER — ONDANSETRON HCL 4 MG/2ML IJ SOLN
4.0000 mg | Freq: Once | INTRAMUSCULAR | Status: AC
  Administered 2014-08-05: 4 mg via INTRAVENOUS
  Filled 2014-08-05: qty 2

## 2014-08-05 MED ORDER — POTASSIUM CHLORIDE CRYS ER 20 MEQ PO TBCR
20.0000 meq | EXTENDED_RELEASE_TABLET | Freq: Every morning | ORAL | Status: DC
  Administered 2014-08-05 – 2014-08-08 (×4): 20 meq via ORAL
  Filled 2014-08-05 (×4): qty 1

## 2014-08-05 MED ORDER — MORPHINE SULFATE ER 30 MG PO TBCR
30.0000 mg | EXTENDED_RELEASE_TABLET | Freq: Two times a day (BID) | ORAL | Status: DC
  Administered 2014-08-05 – 2014-08-08 (×7): 30 mg via ORAL
  Filled 2014-08-05 (×7): qty 1

## 2014-08-05 MED ORDER — SODIUM CHLORIDE 0.9 % IV BOLUS (SEPSIS)
1000.0000 mL | Freq: Once | INTRAVENOUS | Status: AC
Start: 2014-08-05 — End: 2014-08-05
  Administered 2014-08-05: 1000 mL via INTRAVENOUS

## 2014-08-05 MED ORDER — DIPHENHYDRAMINE HCL 25 MG PO CAPS
50.0000 mg | ORAL_CAPSULE | Freq: Once | ORAL | Status: AC
  Administered 2014-08-05: 50 mg via ORAL
  Filled 2014-08-05: qty 2

## 2014-08-05 MED ORDER — DEXTROSE-NACL 5-0.45 % IV SOLN
INTRAVENOUS | Status: DC
  Administered 2014-08-05 – 2014-08-08 (×6): via INTRAVENOUS

## 2014-08-05 MED ORDER — LISINOPRIL 10 MG PO TABS
10.0000 mg | ORAL_TABLET | Freq: Every day | ORAL | Status: DC
  Administered 2014-08-05 – 2014-08-08 (×4): 10 mg via ORAL
  Filled 2014-08-05 (×4): qty 1

## 2014-08-05 MED ORDER — SODIUM CHLORIDE 0.9 % IV SOLN
25.0000 mg | INTRAVENOUS | Status: DC | PRN
  Administered 2014-08-05 – 2014-08-08 (×7): 25 mg via INTRAVENOUS
  Filled 2014-08-05 (×15): qty 0.5

## 2014-08-05 MED ORDER — METOPROLOL TARTRATE 25 MG PO TABS
25.0000 mg | ORAL_TABLET | Freq: Every day | ORAL | Status: DC
  Administered 2014-08-05 – 2014-08-07 (×3): 25 mg via ORAL
  Filled 2014-08-05 (×3): qty 1

## 2014-08-05 MED ORDER — HYDROMORPHONE HCL 2 MG/ML IJ SOLN
2.0000 mg | INTRAMUSCULAR | Status: AC
  Administered 2014-08-05 (×2): 2 mg via INTRAVENOUS
  Filled 2014-08-05 (×2): qty 1

## 2014-08-05 MED ORDER — HYDROXYUREA 500 MG PO CAPS
1500.0000 mg | ORAL_CAPSULE | Freq: Every day | ORAL | Status: DC
  Administered 2014-08-05 – 2014-08-08 (×4): 1500 mg via ORAL
  Filled 2014-08-05 (×4): qty 3

## 2014-08-05 MED ORDER — ENOXAPARIN SODIUM 120 MG/0.8ML ~~LOC~~ SOLN
110.0000 mg | SUBCUTANEOUS | Status: DC
  Administered 2014-08-05 – 2014-08-08 (×4): 110 mg via SUBCUTANEOUS
  Filled 2014-08-05 (×2): qty 0.8
  Filled 2014-08-05 (×2): qty 1

## 2014-08-05 MED ORDER — FOLIC ACID 1 MG PO TABS
1.0000 mg | ORAL_TABLET | Freq: Every morning | ORAL | Status: DC
  Administered 2014-08-05 – 2014-08-08 (×4): 1 mg via ORAL
  Filled 2014-08-05 (×4): qty 1

## 2014-08-05 MED ORDER — SODIUM CHLORIDE 0.9 % IV BOLUS (SEPSIS)
1000.0000 mL | Freq: Once | INTRAVENOUS | Status: AC
  Administered 2014-08-05: 1000 mL via INTRAVENOUS

## 2014-08-05 MED ORDER — SODIUM CHLORIDE 0.9 % IJ SOLN: 9.0000 mL | INTRAMUSCULAR | Status: DC | PRN

## 2014-08-05 MED ORDER — ZOLPIDEM TARTRATE 10 MG PO TABS
10.0000 mg | ORAL_TABLET | Freq: Every evening | ORAL | Status: DC | PRN
  Administered 2014-08-05 – 2014-08-07 (×3): 10 mg via ORAL
  Filled 2014-08-05 (×3): qty 1

## 2014-08-05 MED ORDER — VITAMIN D 1000 UNITS PO TABS
2000.0000 [IU] | ORAL_TABLET | Freq: Every day | ORAL | Status: DC
  Administered 2014-08-05 – 2014-08-08 (×4): 2000 [IU] via ORAL
  Filled 2014-08-05 (×4): qty 2

## 2014-08-05 MED ORDER — DIPHENHYDRAMINE HCL 25 MG PO CAPS: 25.0000 mg | ORAL_CAPSULE | Freq: Four times a day (QID) | ORAL | Status: DC | PRN

## 2014-08-05 MED ORDER — DILTIAZEM HCL ER COATED BEADS 120 MG PO CP24
120.0000 mg | ORAL_CAPSULE | Freq: Every day | ORAL | Status: DC
  Administered 2014-08-05 – 2014-08-08 (×4): 120 mg via ORAL
  Filled 2014-08-05 (×4): qty 1

## 2014-08-05 MED ORDER — HYDROMORPHONE 2 MG/ML HIGH CONCENTRATION IV PCA SOLN
INTRAVENOUS | Status: DC
  Administered 2014-08-05: 09:00:00 via INTRAVENOUS
  Administered 2014-08-05: 2.8 mg via INTRAVENOUS
  Administered 2014-08-05: 11.7 mg via INTRAVENOUS
  Administered 2014-08-06: 14:00:00 via INTRAVENOUS
  Administered 2014-08-06: 3.5 mg via INTRAVENOUS
  Administered 2014-08-06: 8.4 mg via INTRAVENOUS
  Administered 2014-08-06: 4.9 mg via INTRAVENOUS
  Administered 2014-08-06: 2.1 mg via INTRAVENOUS
  Administered 2014-08-06: 9.4 mg via INTRAVENOUS
  Administered 2014-08-06: 8.4 mg via INTRAVENOUS
  Administered 2014-08-07: 2 mg via INTRAVENOUS
  Administered 2014-08-07: 1.4 mg via INTRAVENOUS
  Administered 2014-08-07: 0 mg via INTRAVENOUS
  Administered 2014-08-08: 4.2 mg via INTRAVENOUS
  Administered 2014-08-08: 9.1 mg via INTRAVENOUS
  Administered 2014-08-08: 01:00:00 via INTRAVENOUS
  Administered 2014-08-08: 9.8 mg via INTRAVENOUS
  Filled 2014-08-05 (×3): qty 25

## 2014-08-05 MED ORDER — POLYETHYLENE GLYCOL 3350 17 G PO PACK: 17.0000 g | PACK | Freq: Every day | ORAL | Status: DC | PRN

## 2014-08-05 MED ORDER — ASPIRIN 81 MG PO CHEW
81.0000 mg | CHEWABLE_TABLET | Freq: Every day | ORAL | Status: DC
  Administered 2014-08-05 – 2014-08-08 (×4): 81 mg via ORAL
  Filled 2014-08-05 (×4): qty 1

## 2014-08-05 MED ORDER — NALOXONE HCL 0.4 MG/ML IJ SOLN: 0.4000 mg | INTRAMUSCULAR | Status: DC | PRN

## 2014-08-05 MED ORDER — HYDROMORPHONE HCL 2 MG/ML IJ SOLN
2.0000 mg | Freq: Once | INTRAMUSCULAR | Status: AC
  Administered 2014-08-05: 2 mg via INTRAVENOUS

## 2014-08-05 NOTE — Progress Notes (Signed)
Dr. Zigmund Daniel given update that Oxygen increased to 4l [place] to maintain O2 Sats 90-91. Pt reports no increased shortness of breath and VSS. Maintain current plan of care for Pt.

## 2014-08-05 NOTE — Progress Notes (Signed)
Utilization review completed.  L. J. Ashlin Kreps RN, BSN, CM 

## 2014-08-05 NOTE — ED Notes (Signed)
Pt states that he woke up an hour ago with bilateral rib / side pain; pt states that this is typical sickle cell pain for him

## 2014-08-05 NOTE — ED Provider Notes (Signed)
TIME SEEN: 4:15 AM  CHIEF COMPLAINT: Sickle cell pain crisis  HPI: Pt is a 34 y.o. male with history of sickle cell anemia, history of pulmonary embolus currently on Lovenox and Xarelto who presents to the emergency department complaints of bilateral flank, rib cage pain that is typical of his sickle cell crises it started this morning when he woke up. States he did feel like his heart was racing earlier but this is now gone. No new chest pain or shortness of breath. No fever or cough. No lower extremity swelling or pain. No numbness, tingling or focal weakness. No nausea, vomiting or diarrhea.  ROS: See HPI Constitutional: no fever  Eyes: no drainage  ENT: no runny nose   Cardiovascular:  no chest pain  Resp: no SOB  GI: no vomiting GU: no dysuria Integumentary: no rash  Allergy: no hives  Musculoskeletal: no leg swelling  Neurological: no slurred speech ROS otherwise negative  PAST MEDICAL HISTORY/PAST SURGICAL HISTORY:  Past Medical History  Diagnosis Date  . Sickle cell anemia   . Blood transfusion   . Acute embolism and thrombosis of right internal jugular vein   . Hypokalemia   . Mood disorder   . History of pulmonary embolus (PE)   . Avascular necrosis   . Leukocytosis     Chronic  . Thrombocytosis     Chronic  . Hypertension   . History of Clostridium difficile infection   . Uses marijuana   . Chronic anticoagulation   . Functional asplenia   . Former smoker   . Second hand tobacco smoke exposure   . Alcohol consumption of one to four drinks per day   . Noncompliance with medication regimen   . Sickle-cell crisis with associated acute chest syndrome 05/13/2013  . Acute chest syndrome 06/18/2013  . Demand ischemia 01/02/2014    MEDICATIONS:  Prior to Admission medications   Medication Sig Start Date End Date Taking? Authorizing Provider  aspirin 81 MG chewable tablet Chew 1 tablet (81 mg total) by mouth daily. 07/23/14   [name], MD  Cholecalciferol  (VITAMIN D) 2000 UNITS tablet Take 1 tablet (2,000 Units total) by mouth daily. 02/13/14   [name], MD  diltiazem (CARDIZEM CD) 120 MG 24 hr capsule Take 1 capsule (120 mg total) by mouth daily. 03/13/14   Costin Karlyne Greenspan, MD  enoxaparin (LOVENOX) 150 MG/ML injection Inject 0.73 mLs (110 mg total) into the skin daily. Starting tonight 07/23/2014 at 10:00pm 07/23/14   [name], MD  folic acid (FOLVITE) 1 MG tablet Take 1 tablet (1 mg total) by mouth every morning. 10/08/13   [name], MD  HYDROmorphone (DILAUDID) 4 MG tablet Take 1 tablet (4 mg total) by mouth every 4 (four) hours as needed for severe pain. 07/07/14   Dorena Dew, FNP  hydroxyurea (HYDREA) 500 MG capsule Take 3 capsules (1,500 mg total) by mouth daily. May take with food to minimize GI side effects. 10/30/13   [name], MD  lisinopril (PRINIVIL,ZESTRIL) 10 MG tablet Take 1 tablet (10 mg total) by mouth daily. 01/22/14   [name], MD  metoprolol tartrate (LOPRESSOR) 25 MG tablet Take 1 tablet (25 mg total) by mouth daily. 08/22/13   [name], MD  morphine (MS CONTIN) 30 MG 12 hr tablet Take 1 tablet (30 mg total) by mouth every 12 (twelve) hours. 07/07/14   Dorena Dew, FNP  potassium chloride SA (K-DUR,KLOR-CON) 20 MEQ tablet  Take 1 tablet (20 mEq total) by mouth every morning. 06/09/14   [name], MD  zolpidem (AMBIEN) 10 MG tablet Take 1 tablet (10 mg total) by mouth at bedtime as needed for sleep. 07/09/14   Dorena Dew, FNP    ALLERGIES:  No Known Allergies  SOCIAL HISTORY:  History  Substance Use Topics  . Smoking status: Former Smoker -- 13 years    Quit date: 07/08/2010  . Smokeless tobacco: Never Used  . Alcohol Use: No    FAMILY HISTORY: Family History  Problem Relation Age of Onset  . Sickle cell trait Mother   . Depression Mother   . Diabetes Mother   . Sickle cell trait Father   . Sickle cell trait Brother     EXAM: BP 116/69  mmHg  Pulse 101  Temp(Src) 98.7 F (37.1 C) (Oral)  Resp 20  SpO2 90% CONSTITUTIONAL: Alert and oriented and responds appropriately to questions. Appears uncomfortable but is nontoxic, afebrile HEAD: Normocephalic EYES: Conjunctivae clear, PERRL ENT: normal nose; no rhinorrhea; moist mucous membranes; pharynx without lesions noted NECK: Supple, no meningismus, no LAD  CARD: Regular and minimally tachycardic; S1 and S2 appreciated; no murmurs, no clicks, no rubs, no gallops RESP: Normal chest excursion without splinting or tachypnea; breath sounds clear and equal bilaterally; no wheezes, no rhonchi, no rales, no hypoxia or respiratory distress, speaking full sentences CHEST:  Patient is tender to palpation over the lateral rib cage bilaterally without crepitus or ecchymosis or deformity ABD/GI: Normal bowel sounds; non-distended; soft, non-tender, no rebound, no guarding, no peritoneal signs BACK:  The back appears normal and is non-tender to palpation, there is no CVA tenderness EXT: Normal ROM in all joints; non-tender to palpation; no edema; normal capillary refill; no cyanosis, no calf tenderness or swelling    SKIN: Normal color for age and race; warm NEURO: Moves all extremities equally, sensation to light touch intact diffusely, cranial nerves II through XII intact, normal gait PSYCH: The patient's mood and manner are appropriate. Grooming and personal hygiene are appropriate.  MEDICAL DECISION MAKING: Patient here with exacerbation of his typical sickle cell.  We'll give Dilaudid for pain, IV fluids. Will obtain labs, EKG. Hemodynamically stable. Afebrile.  ED PROGRESS: Patient's labs show leukocytosis with left shift which is chronic for patient. His hemoglobin is down slightly from 7.2 to 7.0 from 2 days ago. Total bilirubin is up, reticulocyte count up. All consistent with sickle cell crisis.  Patient's sats have dropped to 86% after 2 rounds of pain medication but he does not appear  significantly drowsy. Improving with 3 L nasal cannula. He does report pain with deep inspiration that is chronic for him. He does appear to be splinting on exam secondary to pain. He denies any new chest pain. Denies any new shortness of breath. Will obtain chest x-ray and continue to monitor. He does have history of pulmonary emboli and reports he is on Xarelto and Lovenox. Denies missing any doses. Discussed with patient that if his pain is not controlled or he continues to be hypoxic or he will need admission.  6:30 AM  Pt reports no improvement in symptoms after 4 mg of IV Dilaudid. He agrees to admission for pain control. Discussed with Dr. Posey Pronto for admission. Will admit to inpatient, telemetry. His PCP is Dr. Zigmund Daniel.      Date: 08/05/2014  4:28 AM  Rate: 93  Rhythm: normal sinus rhythm  QRS Axis: normal  Intervals: normal  ST/T Wave  abnormalities: T wave inversions in inferior and lateral leads  Conduction Disutrbances: none  Narrative Interpretation: T wave inversions in inferior and lateral leads are unchanged compared to multiple prior EKGs      [employer], DO 08/05/14 [phone]

## 2014-08-05 NOTE — H&P (Signed)
Hospital Admission Note Date: 08/05/2014  Patient name: [name] Medical record number: [id number] Date of birth: 09-21-1980 Age: 34 y.o. Gender: male PCP: Sally-Anne Wamble A., MD  Attending physician: [name], MD  Chief Complaint: Palpitations approximately 9 days ago, and increased pain in ribs and torso x 1 day.  History of Present Illness: Opiate tolerant patient with Hb SS who was recently discharged from the [facility] on [facility]. Pt states that since he has been experiencing intermittent palpitations ever since. He also in on chronic anti-coagulation and was changed to Lovenox from Xarelto as he had blood clots while on Xarelto. Pt was unable to obtain Lovenox at the time of discharge and thus continued to take is Xarelto until 2 days ago. Since then he has had no anticoagulation. Pt also presents with pain in ribs and torso which came on last night which was typical of his pain of SCD crisis. Pain was throbbing, non-radiating, and at the highest 9/10. He tried to manage with his oral analgesics (MS Contin and Dilaudid) but the pain was still uncontrolled so he presented to the ED. Currently his pain 7/10.   Scheduled Meds: . aspirin  81 mg Oral Daily  . cholecalciferol  2,000 Units Oral Daily  . diltiazem  120 mg Oral Daily  . enoxaparin  110 mg Subcutaneous Q24H  . folic acid  1 mg Oral q morning - 10a  . HYDROmorphone PCA 2 mg/mL   Intravenous 6 times per day  . HYDROmorphone      . hydroxyurea  1,500 mg Oral Daily  . lisinopril  10 mg Oral Daily  . metoprolol tartrate  25 mg Oral QHS  . morphine  30 mg Oral Q12H  . potassium chloride SA  20 mEq Oral q morning - 10a  . senna-docusate  1 tablet Oral BID   Continuous Infusions: . dextrose 5 % and 0.45% NaCl     PRN Meds:.diphenhydrAMINE **OR** diphenhydrAMINE (BENADRYL) IVPB(SICKLE CELL ONLY), naloxone **AND** sodium chloride, ondansetron (ZOFRAN) IV, polyethylene glycol, zolpidem Allergies: Review of  patient's allergies indicates no known allergies. Past Medical History  Diagnosis Date  . Sickle cell anemia   . Blood transfusion   . Acute embolism and thrombosis of right internal jugular vein   . Hypokalemia   . Mood disorder   . History of pulmonary embolus (PE)   . Avascular necrosis   . Leukocytosis     Chronic  . Thrombocytosis     Chronic  . Hypertension   . History of Clostridium difficile infection   . Uses marijuana   . Chronic anticoagulation   . Functional asplenia   . Former smoker   . Second hand tobacco smoke exposure   . Alcohol consumption of one to four drinks per day   . Noncompliance with medication regimen   . Sickle-cell crisis with associated acute chest syndrome 05/13/2013  . Acute chest syndrome 06/18/2013  . Demand ischemia 01/02/2014   Past Surgical History  Procedure Laterality Date  . Right hip replacement      08/2006  . Cholecystectomy      01/2008  . Porta cath placement    . Porta cath removal    . Umbilical hernia repair      01/2008  . Excision of left periauricular cyst      10/2009  . Excision of right ear lobe cyst with primary closur      11/2007  . Portacath placement  01/05/2012  Procedure: INSERTION PORT-A-CATH;  Surgeon: Odis Hollingshead, MD;  Location: [place] [facility];  Service: General;  Laterality: N/A;  ultrasound guiced port a cath insertion with fluoroscopy   Family History  Problem Relation Age of Onset  . Sickle cell trait Mother   . Depression Mother   . Diabetes Mother   . Sickle cell trait Father   . Sickle cell trait Brother    History   Social History  . Marital Status: Single    Spouse Name: N/A  . Number of Children: 0  . Years of Education: 13   Occupational History  . 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 Smoking status: Former Smoker -- 13 years    Quit date: 07/08/2010  . Smokeless tobacco: Never Used  . Alcohol Use: No  . Drug Use: 2.00 per  week    Special: Marijuana  . Sexual Activity:    Partners: Female    Museum/gallery curator: None     Comment: month ago   Other Topics Concern  . Not on file   Social History Narrative   Lives in an apartment.  Single.  Lives alone but has a girlfriend that helps care for him.  Does not use any assist devices.        [name]:  [phone] Mom, emergency contact   Review of Systems: A comprehensive review of systems was negative except as noted in HPI. Physical Exam: No intake or output data in the 24 hours ending 08/05/14 0945 General: Alert, awake, oriented x3, in no acute distress.  HEENT: [place]/AT PEERL, EOMI, anicteric Neck: Trachea midline,  no masses, no thyromegal,y no JVD, no carotid bruit OROPHARYNX:  Moist, No exudate/ erythema/lesions.  Heart: Regular rate and rhythm, without murmurs, rubs, gallops, PMI non-displaced, no heaves or thrills on palpation.  Lungs: Clear to auscultation, no wheezing or rhonchi noted. No increased vocal fremitus resonant to percussion  Abdomen: Soft, nontender, nondistended, positive bowel sounds, no masses no hepatosplenomegaly noted..  Neuro: No focal neurological deficits noted cranial nerves II through XII grossly intact.  Strength at baseline in bilateral upper and lower extremities. Musculoskeletal: No warm swelling or erythema around joints, no spinal tenderness noted. Psychiatric: Patient alert and oriented x3, good insight and cognition, good recent to remote recall. Lymph node survey: No cervical axillary or inguinal lymphadenopathy noted.  Lab results:  Recent Labs  08/03/14 0858 08/05/14 0457  NA 140 139  K 3.3* 3.4*  CL 109 113*  CO2 23 21*  GLUCOSE 99 118*  BUN 10 10  CREATININE 0.64 0.74  CALCIUM 8.9 8.4*    Recent Labs  08/03/14 0858 08/05/14 0457  AST 27 28  ALT 23 20  ALKPHOS 62 68  BILITOT 4.6* 4.4*  PROT 7.2 7.1  ALBUMIN 3.7 3.6   No results for input(s): LIPASE, AMYLASE in the last 72  hours.  Recent Labs  08/03/14 0858 08/05/14 0457  WBC 17.9* 19.4*  NEUTROABS 13.0* 14.2*  HGB 7.2* 7.0*  HCT 21.6* 20.7*  MCV 94.3 93.2  PLT 547* 474*   No results for input(s): CKTOTAL, CKMB, CKMBINDEX, TROPONINI in the last 72 hours. Invalid input(s): POCBNP No results for input(s): DDIMER in the last 72 hours. No results for input(s): HGBA1C in the last 72 hours. No results for input(s): CHOL, HDL, LDLCALC, TRIG, CHOLHDL, LDLDIRECT in the last 72 hours. No results for input(s): TSH, T4TOTAL, T3FREE,  THYROIDAB in the last 72 hours.  Invalid input(s): [place]  08/03/14 0858 08/05/14 0457  RETICCTPCT 18.2* 18.8*   Imaging results:  Dg Chest 2 View  08/03/2014   CLINICAL DATA:  Sickle cell pain crisis.  EXAM: CHEST  2 VIEW  COMPARISON:  07/20/2014  FINDINGS: Stable appearance of the left Port-A-Cath. Catheter tip in the lower SVC region. Prominent lung markings in the mid and lower lungs have not significantly changed. There is no significant airspace disease or pulmonary edema. Heart size remains enlarged. No evidence for pleural effusions.  IMPRESSION: Stable mild cardiomegaly without acute lung findings.   Electronically Signed   By: Markus Daft M.D.   On: 08/03/2014 08:49   Dg Chest 2 View  07/20/2014   CLINICAL DATA:  Chest pain for 1 day.  Sickle cell.  EXAM: CHEST  2 VIEW  COMPARISON:  07/14/2014  FINDINGS: Prominent cardiomegaly. Left subclavian Port-A-Cath device with its tip at the cavoatrial junction is stable. Interstitial changes towards lung bases are stable. No pneumothorax. No pleural effusion. No consolidation  IMPRESSION: Cardiomegaly and chronic basilar interstitial changes.   Electronically Signed   By: Marybelle Killings M.D.   On: 07/20/2014 10:12   Dg Chest 2 View  07/14/2014   CLINICAL DATA:  Sickle cell pain crisis. Mid to left upper back pain. Weakness. Started several hours ago.  EXAM: CHEST  2 VIEW  COMPARISON:  06/25/2014  FINDINGS: Cardiac  enlargement with patchy interstitial changes in the lungs similar to previous study. No focal consolidation or airspace disease. No blunting of costophrenic angles. No pneumothorax. Power port type central venous catheter with tip over the cavoatrial junction.  IMPRESSION: Cardiac enlargement with stable patchy interstitial changes in the lungs. No acute infiltration.   Electronically Signed   By: Lucienne Capers M.D.   On: 07/14/2014 06:09   Ct Angio Chest Pe W/cm &/or Wo Cm  07/20/2014   CLINICAL DATA:  Chest pain, sickle cell crisis  EXAM: CT ANGIOGRAPHY CHEST WITH CONTRAST  TECHNIQUE: Multidetector CT imaging of the chest was performed using the standard protocol during bolus administration of intravenous contrast. Multiplanar CT image reconstructions and MIPs were obtained to evaluate the vascular anatomy.  CONTRAST:  144mL OMNIPAQUE IOHEXOL 350 MG/ML SOLN  COMPARISON:  07/20/2014 chest radiograph, chest CT 03/19/2014  FINDINGS: Mediastinum/Nodes: Left-sided Port-A-Cath in place with tip in the right atrium. Cardiomegaly noted. Right atrial prominence. No pericardial effusion. AP window lymphadenopathy persists, largest 1.0 cm image 32. Representative pretracheal node measures 0.7 cm image 37.  Pulmonary arterial dilation is reidentified measuring 4.0 cm maximal transverse diameter image 121 series 6. The examination is adequate for evaluation for acute pulmonary embolism up to and including the 3rd order pulmonary arteries. Multiple bilateral lower lobe filling defects are identified compatible with pulmonary embolism, for example image 178 series 6. Allowing for motion artifact, there is no evidence for right heart strain as by calculated RV: LV ratio of 0.8.  Lungs/Pleura: Areas of mosaic type per fusion are identified bilaterally, likely related to pulmonary emboli. Curvilinear bilateral lower lobe scarring or atelectasis is noted.  Upper abdomen: Normal  Musculoskeletal: Inhomogeneous trabecular  pattern of the vertebral bodies is compatible with sickle cell deformity. No acute osseous abnormality.  Review of the MIP images confirms the above findings.  IMPRESSION: Small to moderate overall clot burden with bilateral predominantly lower lobe pulmonary emboli. No CT evidence for right heart strain.  Cardiomegaly.  Mosaic type perfusion pattern within the  lung parenchyma compatible with pulmonary emboli.  Critical Value/emergent results were called by telephone at the time of interpretation on 07/20/2014 at 11:58 am to Dr. Milton Ferguson , who verbally acknowledged these results.   Electronically Signed   By: Conchita Paris M.D.   On: 07/20/2014 12:00   Dg Chest Port 1 View  08/05/2014   CLINICAL DATA:  Hypoxia.  Sickle cell pain crisis  EXAM: PORTABLE CHEST - 1 VIEW  COMPARISON:  08/03/2014  FINDINGS: Chronic cardiomegaly. Aortic and hilar contours are stable and negative. Stable left subclavian porta catheter, tip at the upper right atrium.  Chronic reticulation of lung markings consistent with scarring. There is no edema, consolidation, effusion, or pneumothorax.  No acute osseous findings.  IMPRESSION: Stable exam.  No evidence of acute cardiopulmonary disease.   Electronically Signed   By: Monte Fantasia M.D.   On: 08/05/2014 06:08   Other results: EKG: normal EKG, normal sinus rhythm, unchanged from previous tracings, Findings consistent with LA dilation. Unchanged from previous EKG.   Assessment and Plan: 1. Hb SS with crisis: Pt will be treated with weight based dose of Dilaudid until PCA is started. He will be re-assessed in 4 hours for need for clinician assisted doses or adjustment of PCA. Continue MS Contin and add Toradol every 6 hours. Also start IVF D5.45 at 100 ml/hr. 2. Hypoxemia/Pulmonary HTN: Pt has known pulmonary Arterial HTN which is a contributing factor to hypoxemia as he was prescribed Oxygen on his last admission. He also has a history of PE's and has not been compliant with  his therapy. I have resumed the Lovenox and am unsure that repeating a CT angiogram so soon after the most recent evaluation will change the course of therapy since he was not on the Lovenox. Will discuss with Pulmonology. Also patient was ordered overnight pulse-ox to be done at home after last admission, however per patient this has not been done.  3. Palpitations: Pt has been having palpitations. Will check TSH. He is on Metoprolol and I am unsure of his compliance. Will monitor on telemetry. 4. Anemia of chronic Disease: Pt is at baseline Hb. However expect a decrease in Hb as patient is hydration and effects of hemodilution is realized. Will continue to monitor. 5. Leukocytosis: Pt has no evidence of infection. Given the thrombocytosis, I suspect that this is secondary to bone marrow turnover. Will check U/A for any signs of infection. 6. Thrombocytosis: Continue ASA 81 mg daily. 7. Secondary Hemochromatosis:  Pt has secondary hemochromatosis but has declined to take Exjade or Jadenu at this time. 8. Sickle Cell Disease: He is on Hydrea and folic acid chronically. Reports compliance.  Time spent 70 minutes.  Aliciana Ricciardi A. 08/05/2014, 9:45 AM

## 2014-08-05 NOTE — ED Notes (Signed)
Joelene Millin, charge RN aware of pt coming.

## 2014-08-06 ENCOUNTER — Ambulatory Visit: Payer: Self-pay | Admitting: Family Medicine

## 2014-08-06 ENCOUNTER — Inpatient Hospital Stay (HOSPITAL_COMMUNITY): Payer: Medicare Other

## 2014-08-06 DIAGNOSIS — R809 Proteinuria, unspecified: Secondary | ICD-10-CM

## 2014-08-06 DIAGNOSIS — D649 Anemia, unspecified: Secondary | ICD-10-CM

## 2014-08-06 LAB — CBC WITH DIFFERENTIAL/PLATELET
BASOS ABS: 0 10*3/uL (ref 0.0–0.1)
Basophils Relative: 0 % (ref 0–1)
EOS ABS: 0.5 10*3/uL (ref 0.0–0.7)
EOS PCT: 3 % (ref 0–5)
HEMATOCRIT: 20.4 % — AB (ref 39.0–52.0)
HEMOGLOBIN: 6.9 g/dL — AB (ref 13.0–17.0)
LYMPHS ABS: 2.5 10*3/uL (ref 0.7–4.0)
Lymphocytes Relative: 14 % (ref 12–46)
MCH: 31.1 pg (ref 26.0–34.0)
MCHC: 33.8 g/dL (ref 30.0–36.0)
MCV: 91.9 fL (ref 78.0–100.0)
MONOS PCT: 12 % (ref 3–12)
Monocytes Absolute: 2.2 10*3/uL — ABNORMAL HIGH (ref 0.1–1.0)
Neutro Abs: 13 10*3/uL — ABNORMAL HIGH (ref 1.7–7.7)
Neutrophils Relative %: 71 % (ref 43–77)
Platelets: 459 10*3/uL — ABNORMAL HIGH (ref 150–400)
RBC: 2.22 MIL/uL — AB (ref 4.22–5.81)
RDW: 18.9 % — ABNORMAL HIGH (ref 11.5–15.5)
WBC: 18.2 10*3/uL — AB (ref 4.0–10.5)
nRBC: 1 /100 WBC — ABNORMAL HIGH

## 2014-08-06 LAB — PREPARE RBC (CROSSMATCH)

## 2014-08-06 LAB — BASIC METABOLIC PANEL
ANION GAP: 6 (ref 5–15)
BUN: 9 mg/dL (ref 6–20)
CALCIUM: 8.6 mg/dL — AB (ref 8.9–10.3)
CO2: 21 mmol/L — AB (ref 22–32)
CREATININE: 0.6 mg/dL — AB (ref 0.61–1.24)
Chloride: 112 mmol/L — ABNORMAL HIGH (ref 101–111)
GFR calc non Af Amer: >60 mL/min (ref >60)
Glucose, Bld: 127 mg/dL — ABNORMAL HIGH (ref 65–99)
Potassium: 4.1 mmol/L (ref 3.5–5.1)
Sodium: 139 mmol/L (ref 135–145)

## 2014-08-06 LAB — TSH: TSH: 1.814 u[IU]/mL (ref 0.350–4.500)

## 2014-08-06 LAB — RETICULOCYTES
RBC.: 2.42 MIL/uL — ABNORMAL LOW (ref 4.22–5.81)
RETIC COUNT ABSOLUTE: 481.6 10*3/uL — AB (ref 19.0–186.0)
Retic Ct Pct: 19.9 % — ABNORMAL HIGH (ref 0.4–3.1)

## 2014-08-06 LAB — LACTATE DEHYDROGENASE: LDH: 387 U/L — ABNORMAL HIGH (ref 98–192)

## 2014-08-06 MED ORDER — SODIUM CHLORIDE 0.9 % IJ SOLN
10.0000 mL | INTRAMUSCULAR | Status: DC | PRN
  Administered 2014-08-06 – 2014-08-08 (×6): 10 mL
  Filled 2014-08-06 (×5): qty 40

## 2014-08-06 MED ORDER — HYDROMORPHONE HCL 4 MG PO TABS
4.0000 mg | ORAL_TABLET | ORAL | Status: DC
  Administered 2014-08-06 – 2014-08-08 (×12): 4 mg via ORAL
  Filled 2014-08-06 (×12): qty 1

## 2014-08-06 MED ORDER — SODIUM CHLORIDE 0.9 % IV SOLN: Freq: Once | INTRAVENOUS | Status: DC

## 2014-08-06 MED ORDER — LIDOCAINE-EPINEPHRINE 2 %-1:100000 IJ SOLN
INTRAMUSCULAR | Status: AC
  Filled 2014-08-06: qty 1

## 2014-08-06 MED ORDER — SODIUM CHLORIDE 0.9 % IV SOLN
Freq: Once | INTRAVENOUS | Status: AC
  Administered 2014-08-06: 10 mL/h via INTRAVENOUS

## 2014-08-06 MED ORDER — LIDOCAINE HCL 1 % IJ SOLN
INTRAMUSCULAR | Status: AC
  Filled 2014-08-06: qty 20

## 2014-08-06 NOTE — Care Management Note (Signed)
Case Management Note  Patient Details  Name: [name] MRN: [id number] Date of Birth: December 18, 1980  Subjective/Objective: Pt admitted with Sickle Cell Pain.                   Action/Plan:   From home plan to return home.    Expected Discharge Date:                  Expected Discharge Plan:  Home/Self Care  In-House Referral:     Discharge planning Services  CM Consult  Post Acute Care Choice:    Choice offered to:     DME Arranged:    DME Agency:     HH Arranged:    HH Agency:     Status of Service:  In process, will continue to follow  Medicare Important Message Given:    Date Medicare IM Given:    Medicare IM give by:    Date Additional Medicare IM Given:    Additional Medicare Important Message give by:     If discussed at [place] of Stay Meetings, dates discussed:    Additional CommentsPurcell Mouton, RN 08/06/2014, 1:03 PM

## 2014-08-06 NOTE — Telephone Encounter (Signed)
Refill request for MS Contin 30mg  12 hour, Ambien 10mg , and Dilaudid 4mg . LOV 07/07/2014. Please advise. Thanks!

## 2014-08-06 NOTE — Progress Notes (Signed)
Received report on patient, pt awake but drowsy sitting up in bed. No complaints of chest pain or shortness of breath. Pt is on 5L [place] which was increased last night due to decrease sat's with ambulation and when asleep. Pt currently saturating 96-97%. Pt rates pain 4/10 and states he feels better. Pt Hgb 6.9, order placed for type and screen by MD. Consent obtained. Awaiting IV team to collect Labs.

## 2014-08-06 NOTE — Progress Notes (Signed)
Pt transported to IR to have PICC line placed for Blood transfusion

## 2014-08-06 NOTE — Progress Notes (Signed)
CRITICAL VALUE ALERT  Critical value received:  Hgb 6.9  Date of notification:  08/06/2014  Time of notification:  0553  Critical value read back:Yes.    Nurse who received alert:  Sabino Gasser, RN  MD notified (1st page):  [name], NP  Time of first page:  [phone]  MD notified (2nd page):  Time of second page:  Responding MD:   Time MD responded:    Notified via text page no return call

## 2014-08-06 NOTE — Progress Notes (Signed)
Pt returned from radiology.

## 2014-08-06 NOTE — Progress Notes (Addendum)
SICKLE CELL SERVICE PROGRESS NOTE  [name] CXK:[id number] DOB: 31-Jan-1981 DOA: 08/05/2014 PCP: Gerald Czerwinski A., MD  Assessment/Plan: Active Problems:   Anemia of chronic disease   Sickle cell crisis  1. Hypoxemia: Pt has a risk of recurrent PE as he was off his recommended anticoagulation for more than 1 week, However he is hemodynamically stable and repeating the CT angiogram will not change the course of treatment. Will continue on treatment dose of Lovenox. Also will transfuse 1 Unit RBC to improve Oxygen carrying capacity.  2. Hb SS with crisis: Pt has imrproved pain with current treatment. He has used 30 mg with 78/93:demands/deliveries. Will schedule oral short acting analgesics and continue PCA for PRN use. Continue MS Contin and Toradol.  3. Leukocytosis: Improved. No signs of infection. Will continue to monitor. U/A negative for infection.  4. Anemia: Pt has anemia of chronic disease and is now with symptomatic anemia. Will transfuse 1 unit RBC.  5. Proteinuria: Pt on Lisinopril 10 mg. Continue  6. Chronic pain: Continue MS Contin. 7. Medication compliance issues: Not sure that patient is being compliant with Hydrea. Continue to encourage compliance.  Code Status: Full Code Family Communication: N/A Disposition Plan: Not yet ready for discharge  [name].  Pager [phone]. If 7PM-7AM, please contact night-coverage.  08/06/2014, 10:14 AM  LOS: 1 day    Consultants:  None  Procedures:  None  Antibiotics:  None  HPI/Subjective: Pt states that he feels better today. Still hypoxic but no increased work of breathing.  Objective: Filed Vitals:   08/06/14 0216 08/06/14 0420 08/06/14 0604 08/06/14 0818  BP: 114/80  120/79   Pulse: 85  96   Temp: 99.1 F (37.3 C)  98.5 F (36.9 C)   TempSrc: Oral  Oral   Resp: 20 23 23 18   Height:      Weight:      SpO2: 94% 96% 93% 96%   Weight change:   Intake/Output Summary (Last 24 hours) at 08/06/14  1014 Last data filed at 08/06/14 8588  Gross per 24 hour  Intake   3850 ml  Output   2220 ml  Net   1630 ml    General: Alert, awake, oriented x3, in no acute distress.  HEENT: [place]/AT PEERL, EOMI, anicteric Neck: Trachea midline,  no masses, no thyromegal,y no JVD, no carotid bruit OROPHARYNX:  Moist, No exudate/ erythema/lesions.  Heart: Regular rate and rhythm, II/VI SEM ast base, no rubs or gallops, PMI non-displaced, no heaves or thrills on palpation.  Lungs: Clear to auscultation, no wheezing or rhonchi noted. No increased vocal fremitus resonant to percussion  Abdomen: Soft, nontender, nondistended, positive bowel sounds, no masses no hepatosplenomegaly noted..  Neuro: No focal neurological deficits noted cranial nerves II through XII grossly intact. Strength at baseline in bilateral upper and lower extremities. Musculoskeletal: No warm swelling or erythema around joints, no spinal tenderness noted. Psychiatric: Patient alert and oriented x3, good insight and cognition, good recent to remote recall.    Data Reviewed: Basic Metabolic Panel:  Recent Labs Lab 08/03/14 0858 08/05/14 0457 08/05/14 0500 08/06/14 0455  NA 140 139  --  139  K 3.3* 3.4*  --  4.1  CL 109 113*  --  112*  CO2 23 21*  --  21*  GLUCOSE 99 118*  --  127*  BUN 10 10  --  9  CREATININE 0.64 0.74  --  0.60*  CALCIUM 8.9 8.4*  --  8.6*  MG  --   --  1.8  --    Liver Function Tests:  Recent Labs Lab 08/03/14 0858 08/05/14 0457  AST 27 28  ALT 23 20  ALKPHOS 62 68  BILITOT 4.6* 4.4*  PROT 7.2 7.1  ALBUMIN 3.7 3.6   No results for input(s): LIPASE, AMYLASE in the last 168 hours. No results for input(s): AMMONIA in the last 168 hours. CBC:  Recent Labs Lab 08/03/14 0858 08/05/14 0457 08/06/14 0455  WBC 17.9* 19.4* 18.2*  NEUTROABS 13.0* 14.2* 13.0*  HGB 7.2* 7.0* 6.9*  HCT 21.6* 20.7* 20.4*  MCV 94.3 93.2 91.9  PLT 547* 474* 459*   Cardiac Enzymes: No results for input(s): CKTOTAL,  CKMB, CKMBINDEX, TROPONINI in the last 168 hours. BNP (last 3 results) No results for input(s): BNP in the last 8760 hours.  ProBNP (last 3 results)  Recent Labs  01/01/14 0909  PROBNP 5790.0*    CBG: No results for input(s): GLUCAP in the last 168 hours.  Recent Results (from the past 240 hour(s))  MRSA PCR Screening     Status: None   Collection Time: 08/05/14  9:09 AM  Result Value Ref Range Status   MRSA by PCR NEGATIVE NEGATIVE Final    Comment:        The GeneXpert MRSA Assay (FDA approved for NASAL specimens only), is one component of a comprehensive MRSA colonization surveillance program. It is not intended to diagnose MRSA infection nor to guide or monitor treatment for MRSA infections.      Studies: Dg Chest 2 View  08/03/2014   CLINICAL DATA:  Sickle cell pain crisis.  EXAM: CHEST  2 VIEW  COMPARISON:  07/20/2014  FINDINGS: Stable appearance of the left Port-A-Cath. Catheter tip in the lower SVC region. Prominent lung markings in the mid and lower lungs have not significantly changed. There is no significant airspace disease or pulmonary edema. Heart size remains enlarged. No evidence for pleural effusions.  IMPRESSION: Stable mild cardiomegaly without acute lung findings.   Electronically Signed   By: Markus Daft M.D.   On: 08/03/2014 08:49   Dg Chest 2 View  07/20/2014   CLINICAL DATA:  Chest pain for 1 day.  Sickle cell.  EXAM: CHEST  2 VIEW  COMPARISON:  07/14/2014  FINDINGS: Prominent cardiomegaly. Left subclavian Port-A-Cath device with its tip at the cavoatrial junction is stable. Interstitial changes towards lung bases are stable. No pneumothorax. No pleural effusion. No consolidation  IMPRESSION: Cardiomegaly and chronic basilar interstitial changes.   Electronically Signed   By: Marybelle Killings M.D.   On: 07/20/2014 10:12   Dg Chest 2 View  07/14/2014   CLINICAL DATA:  Sickle cell pain crisis. Mid to left upper back pain. Weakness. Started several hours ago.   EXAM: CHEST  2 VIEW  COMPARISON:  06/25/2014  FINDINGS: Cardiac enlargement with patchy interstitial changes in the lungs similar to previous study. No focal consolidation or airspace disease. No blunting of costophrenic angles. No pneumothorax. Power port type central venous catheter with tip over the cavoatrial junction.  IMPRESSION: Cardiac enlargement with stable patchy interstitial changes in the lungs. No acute infiltration.   Electronically Signed   By: Lucienne Capers M.D.   On: 07/14/2014 06:09   Ct Angio Chest Pe W/cm &/or Wo Cm  07/20/2014   CLINICAL DATA:  Chest pain, sickle cell crisis  EXAM: CT ANGIOGRAPHY CHEST WITH CONTRAST  TECHNIQUE: Multidetector CT imaging of the chest was performed using the standard protocol during bolus administration of intravenous contrast.  Multiplanar CT image reconstructions and MIPs were obtained to evaluate the vascular anatomy.  CONTRAST:  183mL OMNIPAQUE IOHEXOL 350 MG/ML SOLN  COMPARISON:  07/20/2014 chest radiograph, chest CT 03/19/2014  FINDINGS: Mediastinum/Nodes: Left-sided Port-A-Cath in place with tip in the right atrium. Cardiomegaly noted. Right atrial prominence. No pericardial effusion. AP window lymphadenopathy persists, largest 1.0 cm image 32. Representative pretracheal node measures 0.7 cm image 37.  Pulmonary arterial dilation is reidentified measuring 4.0 cm maximal transverse diameter image 121 series 6. The examination is adequate for evaluation for acute pulmonary embolism up to and including the 3rd order pulmonary arteries. Multiple bilateral lower lobe filling defects are identified compatible with pulmonary embolism, for example image 178 series 6. Allowing for motion artifact, there is no evidence for right heart strain as by calculated RV: LV ratio of 0.8.  Lungs/Pleura: Areas of mosaic type per fusion are identified bilaterally, likely related to pulmonary emboli. Curvilinear bilateral lower lobe scarring or atelectasis is noted.  Upper  abdomen: Normal  Musculoskeletal: Inhomogeneous trabecular pattern of the vertebral bodies is compatible with sickle cell deformity. No acute osseous abnormality.  Review of the MIP images confirms the above findings.  IMPRESSION: Small to moderate overall clot burden with bilateral predominantly lower lobe pulmonary emboli. No CT evidence for right heart strain.  Cardiomegaly.  Mosaic type perfusion pattern within the lung parenchyma compatible with pulmonary emboli.  Critical Value/emergent results were called by telephone at the time of interpretation on 07/20/2014 at 11:58 am to Dr. Milton Ferguson , who verbally acknowledged these results.   Electronically Signed   By: Conchita Paris M.D.   On: 07/20/2014 12:00   Dg Chest Port 1 View  08/05/2014   CLINICAL DATA:  Hypoxia.  Sickle cell pain crisis  EXAM: PORTABLE CHEST - 1 VIEW  COMPARISON:  08/03/2014  FINDINGS: Chronic cardiomegaly. Aortic and hilar contours are stable and negative. Stable left subclavian porta catheter, tip at the upper right atrium.  Chronic reticulation of lung markings consistent with scarring. There is no edema, consolidation, effusion, or pneumothorax.  No acute osseous findings.  IMPRESSION: Stable exam.  No evidence of acute cardiopulmonary disease.   Electronically Signed   By: Monte Fantasia M.D.   On: 08/05/2014 06:08    Scheduled Meds: . sodium chloride   Intravenous Once  . sodium chloride   Intravenous Once  . aspirin  81 mg Oral Daily  . cholecalciferol  2,000 Units Oral Daily  . diltiazem  120 mg Oral Daily  . enoxaparin  110 mg Subcutaneous Q24H  . folic acid  1 mg Oral q morning - 10a  . HYDROmorphone PCA 2 mg/mL   Intravenous 6 times per day  . HYDROmorphone  4 mg Oral Q4H  . hydroxyurea  1,500 mg Oral Daily  . lisinopril  10 mg Oral Daily  . metoprolol tartrate  25 mg Oral QHS  . morphine  30 mg Oral Q12H  . potassium chloride SA  20 mEq Oral q morning - 10a  . senna-docusate  1 tablet Oral BID    Continuous Infusions: . dextrose 5 % and 0.45% NaCl 100 mL/hr at 08/06/14 0931    Time spent 35 minutes.      Spoke with Dr. Alva-Pulmonology about the decision of not re-scanning patient for possibility of PE andhe agrees with clinical decision making around just continuing Lovenox 1.5 mg/kg daily without repeating CT as this will not add any new information nor change course of treatment.

## 2014-08-06 NOTE — Progress Notes (Signed)
Wasted PCA with Catie Beeson,RN  In sink 34ml.

## 2014-08-07 LAB — CBC WITH DIFFERENTIAL/PLATELET
BASOS PCT: 1 % (ref 0–1)
Basophils Absolute: 0.1 10*3/uL (ref 0.0–0.1)
Eosinophils Absolute: 0.6 10*3/uL (ref 0.0–0.7)
Eosinophils Relative: 4 % (ref 0–5)
HCT: 21.7 % — ABNORMAL LOW (ref 39.0–52.0)
Hemoglobin: 7.4 g/dL — ABNORMAL LOW (ref 13.0–17.0)
LYMPHS PCT: 14 % (ref 12–46)
Lymphs Abs: 2 10*3/uL (ref 0.7–4.0)
MCH: 30.2 pg (ref 26.0–34.0)
MCHC: 34.1 g/dL (ref 30.0–36.0)
MCV: 88.6 fL (ref 78.0–100.0)
Monocytes Absolute: 2.2 10*3/uL — ABNORMAL HIGH (ref 0.1–1.0)
Monocytes Relative: 15 % — ABNORMAL HIGH (ref 3–12)
Neutro Abs: 10.1 10*3/uL — ABNORMAL HIGH (ref 1.7–7.7)
Neutrophils Relative %: 66 % (ref 43–77)
PLATELETS: 398 10*3/uL (ref 150–400)
RBC: 2.45 MIL/uL — ABNORMAL LOW (ref 4.22–5.81)
RDW: 18.2 % — AB (ref 11.5–15.5)
WBC: 15.1 10*3/uL — AB (ref 4.0–10.5)

## 2014-08-07 NOTE — Progress Notes (Signed)
Pt has been awake all night moving/ shaking, constant tremor, when asked about how he doing become agitated and irritable. Pt is too sedated. Held PO Dilaudid, pt appear to lethergic, strange mental affect from pain med and other meds ordered. Pain could not convey realistic pain needs, appear to be seeking out for meds. SRP, RN

## 2014-08-07 NOTE — Progress Notes (Addendum)
Pt is too sedated-lethergic, shaky, jittery, parkinson like tremors, agitated at times. Does not provide or convey specific pain needs.States "give me my meds, I need them". Pt moving constantly. Pt requested Benedryl--administered initally--then stopped when noticed pt movements and mental status changes. Marland Kitchen

## 2014-08-07 NOTE — Progress Notes (Signed)
SICKLE CELL SERVICE PROGRESS NOTE  [name] AVW:[id number] DOB: [birth year] DOA: 08/05/2014 PCP: [name],[name], MD  Assessment/Plan: Active Problems:   Anemia of chronic disease   Sickle cell crisis  1. Hypoxemia: Pt is doing much better now and is no longer hypoxemic. He has received 1 unit of packed red blood cells and feeling better. We are transferring him to the medical floor. 2. Hb SS with crisis: Pt has imrproved pain with current treatment. He has used 23 mg with 66./63:demands/deliveries. Will continue oral short acting analgesics and continue PCA for PRN use. Continue MS Contin and Toradol.  3. Leukocytosis: Improved. No signs of infection. Will continue to monitor. U/A negative for infection.  4. Anemia: Pt has anemia of chronic disease and is now with symptomatic anemia. Will transfuse 1 unit RBC.  5. Proteinuria: Pt on Lisinopril 10 mg. Continue  6. Chronic pain: Continue MS Contin. 7. Medication compliance issues: Not sure that patient is being compliant with Hydrea. Continue to encourage compliance.  Code Status: Full Code Family Communication: N/A Disposition Plan: Not yet ready for discharge  [facility]  Pager [phone]. If 7PM-7AM, please contact night-coverage.  08/07/2014, 7:49 AM  LOS: 2 days    Consultants:  None  Procedures:  None  Antibiotics:  None  HPI/Subjective: Pt states that he feels better today. Still hypoxic but no increased work of breathing.  Objective: Filed Vitals:   08/07/14 0000 08/07/14 0203 08/07/14 0400 08/07/14 0557  BP:  115/87  118/80  Pulse:  84  72  Temp:  99.5 F (37.5 C)  98.2 F (36.8 C)  TempSrc:  Oral  Oral  Resp: 18 18 20 18   Height:      Weight:      SpO2: 95% 95% 93% 95%   Weight change:   Intake/Output Summary (Last 24 hours) at 08/07/14 0749 Last data filed at 08/06/14 2138  Gross per 24 hour  Intake 2118.83 ml  Output   1400 ml  Net 718.83 ml    General: Alert, awake, oriented x3, in  no acute distress.  HEENT: [place]/AT PEERL, EOMI, anicteric Neck: Trachea midline,  no masses, no thyromegal,y no JVD, no carotid bruit OROPHARYNX:  Moist, No exudate/ erythema/lesions.  Heart: Regular rate and rhythm, II/VI SEM ast base, no rubs or gallops, PMI non-displaced, no heaves or thrills on palpation.  Lungs: Clear to auscultation, no wheezing or rhonchi noted. No increased vocal fremitus resonant to percussion  Abdomen: Soft, nontender, nondistended, positive bowel sounds, no masses no hepatosplenomegaly noted..  Neuro: No focal neurological deficits noted cranial nerves II through XII grossly intact. Strength at baseline in bilateral upper and lower extremities. Musculoskeletal: No warm swelling or erythema around joints, no spinal tenderness noted. Psychiatric: Patient alert and oriented x3, good insight and cognition, good recent to remote recall.    Data Reviewed: Basic Metabolic Panel:  Recent Labs Lab 08/03/14 0858 08/05/14 0457 08/05/14 0500 08/06/14 0455  NA 140 139  --  139  K 3.3* 3.4*  --  4.1  CL 109 113*  --  112*  CO2 23 21*  --  21*  GLUCOSE 99 118*  --  127*  BUN 10 10  --  9  CREATININE 0.64 0.74  --  0.60*  CALCIUM 8.9 8.4*  --  8.6*  MG  --   --  1.8  --    Liver Function Tests:  Recent Labs Lab 08/03/14 0858 08/05/14 0457  AST 27 28  ALT 23  20  ALKPHOS 62 68  BILITOT 4.6* 4.4*  PROT 7.2 7.1  ALBUMIN 3.7 3.6   No results for input(s): LIPASE, AMYLASE in the last 168 hours. No results for input(s): AMMONIA in the last 168 hours. CBC:  Recent Labs Lab 08/03/14 0858 08/05/14 0457 08/06/14 0455 08/07/14 0525  WBC 17.9* 19.4* 18.2* 15.1*  NEUTROABS 13.0* 14.2* 13.0* 10.1*  HGB 7.2* 7.0* 6.9* 7.4*  HCT 21.6* 20.7* 20.4* 21.7*  MCV 94.3 93.2 91.9 88.6  PLT 547* 474* 459* 398   Cardiac Enzymes: No results for input(s): CKTOTAL, CKMB, CKMBINDEX, TROPONINI in the last 168 hours. BNP (last 3 results) No results for input(s): BNP in the  last 8760 hours.  ProBNP (last 3 results)  Recent Labs  01/01/14 0909  PROBNP 5790.0*    CBG: No results for input(s): GLUCAP in the last 168 hours.  Recent Results (from the past 240 hour(s))  MRSA PCR Screening     Status: None   Collection Time: 08/05/14  9:09 AM  Result Value Ref Range Status   MRSA by PCR NEGATIVE NEGATIVE Final    Comment:        The GeneXpert MRSA Assay (FDA approved for NASAL specimens only), is one component of a comprehensive MRSA colonization surveillance program. It is not intended to diagnose MRSA infection nor to guide or monitor treatment for MRSA infections.      Studies: Dg Chest 2 View  08/03/2014   CLINICAL DATA:  Sickle cell pain crisis.  EXAM: CHEST  2 VIEW  COMPARISON:  07/20/2014  FINDINGS: Stable appearance of the left Port-A-Cath. Catheter tip in the lower SVC region. Prominent lung markings in the mid and lower lungs have not significantly changed. There is no significant airspace disease or pulmonary edema. Heart size remains enlarged. No evidence for pleural effusions.  IMPRESSION: Stable mild cardiomegaly without acute lung findings.   Electronically Signed   By: Markus Daft M.D.   On: 08/03/2014 08:49   Dg Chest 2 View  07/20/2014   CLINICAL DATA:  Chest pain for 1 day.  Sickle cell.  EXAM: CHEST  2 VIEW  COMPARISON:  07/14/2014  FINDINGS: Prominent cardiomegaly. Left subclavian Port-A-Cath device with its tip at the cavoatrial junction is stable. Interstitial changes towards lung bases are stable. No pneumothorax. No pleural effusion. No consolidation  IMPRESSION: Cardiomegaly and chronic basilar interstitial changes.   Electronically Signed   By: Marybelle Killings M.D.   On: 07/20/2014 10:12   Dg Chest 2 View  07/14/2014   CLINICAL DATA:  Sickle cell pain crisis. Mid to left upper back pain. Weakness. Started several hours ago.  EXAM: CHEST  2 VIEW  COMPARISON:  06/25/2014  FINDINGS: Cardiac enlargement with patchy interstitial changes  in the lungs similar to previous study. No focal consolidation or airspace disease. No blunting of costophrenic angles. No pneumothorax. Power port type central venous catheter with tip over the cavoatrial junction.  IMPRESSION: Cardiac enlargement with stable patchy interstitial changes in the lungs. No acute infiltration.   Electronically Signed   By: Lucienne Capers M.D.   On: 07/14/2014 06:09   Ct Angio Chest Pe W/cm &/or Wo Cm  07/20/2014   CLINICAL DATA:  Chest pain, sickle cell crisis  EXAM: CT ANGIOGRAPHY CHEST WITH CONTRAST  TECHNIQUE: Multidetector CT imaging of the chest was performed using the standard protocol during bolus administration of intravenous contrast. Multiplanar CT image reconstructions and MIPs were obtained to evaluate the vascular anatomy.  CONTRAST:  152mL OMNIPAQUE IOHEXOL 350 MG/ML SOLN  COMPARISON:  07/20/2014 chest radiograph, chest CT 03/19/2014  FINDINGS: Mediastinum/Nodes: Left-sided Port-A-Cath in place with tip in the right atrium. Cardiomegaly noted. Right atrial prominence. No pericardial effusion. AP window lymphadenopathy persists, largest 1.0 cm image 32. Representative pretracheal node measures 0.7 cm image 37.  Pulmonary arterial dilation is reidentified measuring 4.0 cm maximal transverse diameter image 121 series 6. The examination is adequate for evaluation for acute pulmonary embolism up to and including the 3rd order pulmonary arteries. Multiple bilateral lower lobe filling defects are identified compatible with pulmonary embolism, for example image 178 series 6. Allowing for motion artifact, there is no evidence for right heart strain as by calculated RV: LV ratio of 0.8.  Lungs/Pleura: Areas of mosaic type per fusion are identified bilaterally, likely related to pulmonary emboli. Curvilinear bilateral lower lobe scarring or atelectasis is noted.  Upper abdomen: Normal  Musculoskeletal: Inhomogeneous trabecular pattern of the vertebral bodies is compatible with  sickle cell deformity. No acute osseous abnormality.  Review of the MIP images confirms the above findings.  IMPRESSION: Small to moderate overall clot burden with bilateral predominantly lower lobe pulmonary emboli. No CT evidence for right heart strain.  Cardiomegaly.  Mosaic type perfusion pattern within the lung parenchyma compatible with pulmonary emboli.  Critical Value/emergent results were called by telephone at the time of interpretation on 07/20/2014 at 11:58 am to Dr. Milton Ferguson , who verbally acknowledged these results.   Electronically Signed   By: Conchita Paris M.D.   On: 07/20/2014 12:00   Ir Fluoro Guide Cv Line Right  08/06/2014   CLINICAL DATA:  Sickle cell  EXAM: RIGHT UPPER EXTREMITY PICC LINE PLACEMENT WITH ULTRASOUND AND FLUOROSCOPIC GUIDANCE  FLUOROSCOPY TIME:  12 seconds  PROCEDURE: The patient was advised of the possible risks and complications and agreed to undergo the procedure. The patient was then brought to the angiographic suite for the procedure.  The right arm was prepped with chlorhexidine, draped in the usual sterile fashion using maximum barrier technique (cap and mask, sterile gown, sterile gloves, large sterile sheet, hand hygiene and cutaneous antisepsis) and infiltrated locally with 1% Lidocaine.  Ultrasound demonstrated patency of the right basilic vein, and this was documented with an image. Under real-time ultrasound guidance, this vein was accessed with a 21 gauge micropuncture needle and image documentation was performed. A 0.018 wire was introduced in to the vein. Over this, a 5 [place] double lumen power PICC was advanced to the lower SVC/right atrial junction. Fluoroscopy during the procedure and fluoro spot radiograph confirms appropriate catheter position. The catheter was flushed and covered with a sterile dressing.  COMPLICATIONS: None  LENGTH: 45 cm  IMPRESSION: Successful right arm power PICC line placement with ultrasound and fluoroscopic guidance. The  catheter is ready for use.   Electronically Signed   By: Marybelle Killings M.D.   On: 08/06/2014 13:16   Ir US Guide Vasc Access Right  08/06/2014   CLINICAL DATA:  Sickle cell  EXAM: RIGHT UPPER EXTREMITY PICC LINE PLACEMENT WITH ULTRASOUND AND FLUOROSCOPIC GUIDANCE  FLUOROSCOPY TIME:  12 seconds  PROCEDURE: The patient was advised of the possible risks and complications and agreed to undergo the procedure. The patient was then brought to the angiographic suite for the procedure.  The right arm was prepped with chlorhexidine, draped in the usual sterile fashion using maximum barrier technique (cap and mask, sterile gown, sterile gloves, large sterile sheet, hand hygiene and cutaneous antisepsis) and infiltrated locally  with 1% Lidocaine.  Ultrasound demonstrated patency of the right basilic vein, and this was documented with an image. Under real-time ultrasound guidance, this vein was accessed with a 21 gauge micropuncture needle and image documentation was performed. A 0.018 wire was introduced in to the vein. Over this, a 5 [place] double lumen power PICC was advanced to the lower SVC/right atrial junction. Fluoroscopy during the procedure and fluoro spot radiograph confirms appropriate catheter position. The catheter was flushed and covered with a sterile dressing.  COMPLICATIONS: None  LENGTH: 45 cm  IMPRESSION: Successful right arm power PICC line placement with ultrasound and fluoroscopic guidance. The catheter is ready for use.   Electronically Signed   By: Marybelle Killings M.D.   On: 08/06/2014 13:16   Dg Chest Port 1 View  08/05/2014   CLINICAL DATA:  Hypoxia.  Sickle cell pain crisis  EXAM: PORTABLE CHEST - 1 VIEW  COMPARISON:  08/03/2014  FINDINGS: Chronic cardiomegaly. Aortic and hilar contours are stable and negative. Stable left subclavian porta catheter, tip at the upper right atrium.  Chronic reticulation of lung markings consistent with scarring. There is no edema, consolidation, effusion, or  pneumothorax.  No acute osseous findings.  IMPRESSION: Stable exam.  No evidence of acute cardiopulmonary disease.   Electronically Signed   By: Monte Fantasia M.D.   On: 08/05/2014 06:08    Scheduled Meds: . sodium chloride   Intravenous Once  . aspirin  81 mg Oral Daily  . cholecalciferol  2,000 Units Oral Daily  . diltiazem  120 mg Oral Daily  . enoxaparin  110 mg Subcutaneous Q24H  . folic acid  1 mg Oral q morning - 10a  . HYDROmorphone PCA 2 mg/mL   Intravenous 6 times per day  . HYDROmorphone  4 mg Oral Q4H  . hydroxyurea  1,500 mg Oral Daily  . lisinopril  10 mg Oral Daily  . metoprolol tartrate  25 mg Oral QHS  . morphine  30 mg Oral Q12H  . potassium chloride SA  20 mEq Oral q morning - 10a  . senna-docusate  1 tablet Oral BID   Continuous Infusions: . dextrose 5 % and 0.45% NaCl 100 mL/hr at 08/06/14 0931    Time spent 35 minutes.

## 2014-08-07 NOTE — Care Management (Signed)
Important Message  Patient Details  Name: [name] MRN: [id number] Date of Birth: 1980-09-13   Medicare Important Message Given:  Yes-second notification given    [name] 08/07/2014, 11:18 AM

## 2014-08-08 ENCOUNTER — Other Ambulatory Visit: Payer: Self-pay | Admitting: Family Medicine

## 2014-08-08 DIAGNOSIS — G4700 Insomnia, unspecified: Secondary | ICD-10-CM

## 2014-08-08 DIAGNOSIS — R0902 Hypoxemia: Secondary | ICD-10-CM | POA: Diagnosis not present

## 2014-08-08 DIAGNOSIS — D5700 Hb-SS disease with crisis, unspecified: Secondary | ICD-10-CM | POA: Diagnosis not present

## 2014-08-08 DIAGNOSIS — G894 Chronic pain syndrome: Secondary | ICD-10-CM

## 2014-08-08 MED ORDER — HEPARIN SOD (PORK) LOCK FLUSH 100 UNIT/ML IV SOLN
500.0000 [IU] | INTRAVENOUS | Status: DC | PRN
  Administered 2014-08-08: 500 [IU]
  Filled 2014-08-08 (×2): qty 5

## 2014-08-08 MED ORDER — HYDROMORPHONE HCL 4 MG PO TABS: 4.0000 mg | ORAL_TABLET | ORAL | Status: DC | PRN

## 2014-08-08 MED ORDER — ZOLPIDEM TARTRATE 10 MG PO TABS: 10.0000 mg | ORAL_TABLET | Freq: Every evening | ORAL | Status: DC | PRN

## 2014-08-08 MED ORDER — MORPHINE SULFATE ER 30 MG PO TBCR: 30.0000 mg | EXTENDED_RELEASE_TABLET | Freq: Two times a day (BID) | ORAL | Status: DC

## 2014-08-08 MED ORDER — HEPARIN SOD (PORK) LOCK FLUSH 100 UNIT/ML IV SOLN
500.0000 [IU] | INTRAVENOUS | Status: DC
  Filled 2014-08-08: qty 5

## 2014-08-08 NOTE — Progress Notes (Signed)
Pt given discharge teaching and instructions regarding home care. RN asked Pt if he has filled Lovenox Prescription Pt states "I am going to go pick up when I leave here" Pt has been able to administer Lovenox shots here and RN stressed importance of taking Lovenox as MD has prescribed and Pt states "Yeah I know". Pt also reminded about wearing oxygen as MD has asked. Pt verbalizes understanding of all discharge teaching and instructions.

## 2014-08-08 NOTE — Progress Notes (Addendum)
MD has written order for Pt to be discharged to home today. Discussion with Pt and Pt states "I have oxygen tanks at home" When RN asked Pt regarding how much oxygen flow he uses Pt stated "I don't know I just turn it on when I feel short of Breath." MD via phone updated that O2 sats on room air are now 86%. MD states she told Pt to try to maintain O2 saturation 95% and above" Order also given to discontinue PICC line in right arm prior to discharge home

## 2014-08-08 NOTE — Progress Notes (Signed)
Pt to be discharged to home. High Dose Dilaudid PCA wasted by 2 RNs Elwin Sleight RN witnessed waste of 15 in sink.

## 2014-08-08 NOTE — Care Management Note (Signed)
Case Management Note  Patient Details  Name: [name] MRN: [id number] Date of Birth: 25-Nov-1980  Subjective/Objective:                    Action/Plan: Home O2 from [place]   Expected Discharge Date:                  Expected Discharge Plan:  Home/Self Care  In-House Referral:     Discharge planning Services  CM Consult  Post Acute Care Choice:    Choice offered to:     DME Arranged:  Oxygen DME Agency:  [place]:    [facility] Agency:     Status of Service:  In process, will continue to follow  Medicare Important Message Given:  Yes-second notification given Date Medicare IM Given:    Medicare IM give by:    Date Additional Medicare IM Given:    Additional Medicare Important Message give by:     If discussed at [place] of Stay Meetings, dates discussed:    Additional CommentsPurcell Mouton, RN 08/08/2014, 2:13 PM

## 2014-08-08 NOTE — Discharge Summary (Signed)
Sickle-Cell Discharge Summary  [name] PPI:[id number] DOB: [birth year] DOA: 08/05/2014  PCP: [name],[name], MD  Admit date: 08/05/2014 Discharge date: 08/08/2014  Discharge Diagnoses:  Active Problems:   Anemia of chronic disease   Sickle cell crisis   Discharge Condition: improved/stable  Disposition:      Follow-up Information    Follow up with [name], MD In 3 days.   Specialty:  Oncology   Why:  anticoagulation clinic   Contact information:   [address] [address] [phone]       Diet:regular  Wt Readings from Last 3 Encounters:  08/05/14 163 lb 3.2 oz (74.027 kg)  07/21/14 162 lb 7.7 oz (73.7 kg)  07/07/14 162 lb (73.483 kg)    History of present illness:  Opiate tolerant patient with Hb SS who was recently discharged from the [facility] on 6/29. Pt states that since he has been experiencing intermittent palpitations ever since. He also in on chronic anti-coagulation and was changed to Lovenox from Xarelto as he had blood clots while on Xarelto. Pt was unable to obtain Lovenox at the time of discharge and thus continued to take is Xarelto until 2 days ago. Since then he has had no anticoagulation. Pt also presents with pain in ribs and torso which came on last night which was typical of his pain of SCD crisis. Pain was throbbing, non-radiating, and at the highest 9/10. He tried to manage with his oral analgesics (MS Contin and Dilaudid) but the pain was still uncontrolled so he presented to the ED. Currently his pain 7/10.   Hospital Course by problem:  Hypoxia/partially treated PE: Pt had an O2 sat at a nadir of ~ 85% on RA on presentation. He was given supplemental oxygen which raised O2 sat. He required about 4L to reach an O2 sat >95%. He is known to have PE and was previously discharged on therapeutic Lovenox. Pt did not start Lovenox at home, but was continued on it here. Pt has home oxygen and was instructed on use and how  to titrate oxygen at home based off his pulse oximeter reading. Pt stated understanding and instructions were reiterated by nursing. Pt also stated that he would pick up his Lovenox from his pharmacy and begin use tomorrow. Pt was instructed to follow-up in anticoagulation clinic as previously planned. Pt was instructed to seek medical attention if feeling weak, fatigued, fever, SOB, CP, HA, and/or dizziness.  Anemia: Pt had a Hgb of 6.9 which is around his baseline on admission, however he was symptomatic so he was transfused 1 unit PRBC. Hgb raised to 7.4 and he was no longer symptomatic on discharge.   Sickle Cell Crisis: Patient presented with pain characteristic of acute vaso-occlusive crisis. Pt's pain was treated with bolus IV analgesics initially and was later transitioned with a Dilaudid PCA and ketorolac. As his pain improved, Dilaudid PCA was continued and he was started on his home dose of Dilaudid. His pain was well controlled with his oral home regimen without much need for PCA. He had overall improvement of his pain and was physically functional upon discharge.   Discharge Exam: Filed Vitals:   08/08/14 1155  BP:   Pulse:   Temp:   Resp: 18   Filed Vitals:   08/08/14 0800 08/08/14 0947 08/08/14 1155 08/08/14 1316  BP:  110/70    Pulse:      Temp:      TempSrc:  Resp: 16  18   Height:      Weight:      SpO2: 98%  96% 86%   General: Alert, awake, oriented x3, in no acute distress.  HEENT: [place]/AT PEERL, EOMI Neck: Trachea midline, no masses, no JVD, no carotid bruit, no LAD OROPHARYNX: Moist, No exudate/ erythema/lesions.  Heart: Regular rate and rhythm, without murmurs, rubs, gallops Lungs: Clear to auscultation, no wheezing or rhonchi noted.  Abdomen: Soft, nontender, nondistended, positive bowel sounds, no masses no hepatosplenomegaly noted. Neuro: No focal neurological deficits noted cranial nerves II through XII grossly intact. Strength 5 out of 5 in bilateral  upper and lower extremities. Nl gait, ambulating without difficulty. Musculoskeletal: No warm swelling or erythema around joints, no spinal tenderness noted. Extremities: No c/c/e   Discharge Instructions Pick up needed meds at pharmacy Pt advised to read educational instructions and to seek medical attention if having the symptoms described above     Medication List    TAKE these medications        aspirin 81 MG chewable tablet  Chew 1 tablet (81 mg total) by mouth daily.     diltiazem 120 MG 24 hr capsule  Commonly known as:  CARDIZEM CD  Take 1 capsule (120 mg total) by mouth daily.     enoxaparin 150 MG/ML injection  Commonly known as:  LOVENOX  Inject 0.73 mLs (110 mg total) into the skin daily. Starting tonight 07/23/2014 at 84:16SA     folic acid 1 MG tablet  Commonly known as:  FOLVITE  Take 1 tablet (1 mg total) by mouth every morning.     hydroxyurea 500 MG capsule  Commonly known as:  HYDREA  Take 3 capsules (1,500 mg total) by mouth daily. May take with food to minimize GI side effects.     lisinopril 10 MG tablet  Commonly known as:  PRINIVIL,ZESTRIL  Take 1 tablet (10 mg total) by mouth daily.     metoprolol tartrate 25 MG tablet  Commonly known as:  LOPRESSOR  Take 1 tablet (25 mg total) by mouth daily.     potassium chloride SA 20 MEQ tablet  Commonly known as:  K-DUR,KLOR-CON  Take 1 tablet (20 mEq total) by mouth every morning.     Vitamin D 2000 UNITS tablet  Take 1 tablet (2,000 Units total) by mouth daily.      ASK your doctor about these medications        HYDROmorphone 4 MG tablet  Commonly known as:  DILAUDID  Take 1 tablet (4 mg total) by mouth every 4 (four) hours as needed for severe pain.  Ask about: Which instructions should I use?     morphine 30 MG 12 hr tablet  Commonly known as:  MS CONTIN  Take 1 tablet (30 mg total) by mouth every 12 (twelve) hours.  Ask about: Which instructions should I use?     zolpidem 10 MG tablet   Commonly known as:  AMBIEN  Take 1 tablet (10 mg total) by mouth at bedtime as needed for sleep.  Ask about: Which instructions should I use?          The results of significant diagnostics from this hospitalization (including imaging, microbiology, ancillary and laboratory) are listed below for reference.    Significant Diagnostic Studies: Dg Chest 2 View  08/03/2014   CLINICAL DATA:  Sickle cell pain crisis.  EXAM: CHEST  2 VIEW  COMPARISON:  07/20/2014  FINDINGS: Stable appearance of the  left Port-A-Cath. Catheter tip in the lower SVC region. Prominent lung markings in the mid and lower lungs have not significantly changed. There is no significant airspace disease or pulmonary edema. Heart size remains enlarged. No evidence for pleural effusions.  IMPRESSION: Stable mild cardiomegaly without acute lung findings.   Electronically Signed   By: Markus Daft M.D.   On: 08/03/2014 08:49   Dg Chest 2 View  07/20/2014   CLINICAL DATA:  Chest pain for 1 day.  Sickle cell.  EXAM: CHEST  2 VIEW  COMPARISON:  07/14/2014  FINDINGS: Prominent cardiomegaly. Left subclavian Port-A-Cath device with its tip at the cavoatrial junction is stable. Interstitial changes towards lung bases are stable. No pneumothorax. No pleural effusion. No consolidation  IMPRESSION: Cardiomegaly and chronic basilar interstitial changes.   Electronically Signed   By: Marybelle Killings M.D.   On: 07/20/2014 10:12   Dg Chest 2 View  07/14/2014   CLINICAL DATA:  Sickle cell pain crisis. Mid to left upper back pain. Weakness. Started several hours ago.  EXAM: CHEST  2 VIEW  COMPARISON:  06/25/2014  FINDINGS: Cardiac enlargement with patchy interstitial changes in the lungs similar to previous study. No focal consolidation or airspace disease. No blunting of costophrenic angles. No pneumothorax. Power port type central venous catheter with tip over the cavoatrial junction.  IMPRESSION: Cardiac enlargement with stable patchy interstitial  changes in the lungs. No acute infiltration.   Electronically Signed   By: Lucienne Capers M.D.   On: 07/14/2014 06:09   Ct Angio Chest Pe W/cm &/or Wo Cm  07/20/2014   CLINICAL DATA:  Chest pain, sickle cell crisis  EXAM: CT ANGIOGRAPHY CHEST WITH CONTRAST  TECHNIQUE: Multidetector CT imaging of the chest was performed using the standard protocol during bolus administration of intravenous contrast. Multiplanar CT image reconstructions and MIPs were obtained to evaluate the vascular anatomy.  CONTRAST:  178mL OMNIPAQUE IOHEXOL 350 MG/ML SOLN  COMPARISON:  07/20/2014 chest radiograph, chest CT 03/19/2014  FINDINGS: Mediastinum/Nodes: Left-sided Port-A-Cath in place with tip in the right atrium. Cardiomegaly noted. Right atrial prominence. No pericardial effusion. AP window lymphadenopathy persists, largest 1.0 cm image 32. Representative pretracheal node measures 0.7 cm image 37.  Pulmonary arterial dilation is reidentified measuring 4.0 cm maximal transverse diameter image 121 series 6. The examination is adequate for evaluation for acute pulmonary embolism up to and including the 3rd order pulmonary arteries. Multiple bilateral lower lobe filling defects are identified compatible with pulmonary embolism, for example image 178 series 6. Allowing for motion artifact, there is no evidence for right heart strain as by calculated RV: LV ratio of 0.8.  Lungs/Pleura: Areas of mosaic type per fusion are identified bilaterally, likely related to pulmonary emboli. Curvilinear bilateral lower lobe scarring or atelectasis is noted.  Upper abdomen: Normal  Musculoskeletal: Inhomogeneous trabecular pattern of the vertebral bodies is compatible with sickle cell deformity. No acute osseous abnormality.  Review of the MIP images confirms the above findings.  IMPRESSION: Small to moderate overall clot burden with bilateral predominantly lower lobe pulmonary emboli. No CT evidence for right heart strain.  Cardiomegaly.  Mosaic  type perfusion pattern within the lung parenchyma compatible with pulmonary emboli.  Critical Value/emergent results were called by telephone at the time of interpretation on 07/20/2014 at 11:58 am to Dr. Milton Ferguson , who verbally acknowledged these results.   Electronically Signed   By: Conchita Paris M.D.   On: 07/20/2014 12:00   Ir Fluoro Guide Cv Line Right  08/06/2014   CLINICAL DATA:  Sickle cell  EXAM: RIGHT UPPER EXTREMITY PICC LINE PLACEMENT WITH ULTRASOUND AND FLUOROSCOPIC GUIDANCE  FLUOROSCOPY TIME:  12 seconds  PROCEDURE: The patient was advised of the possible risks and complications and agreed to undergo the procedure. The patient was then brought to the angiographic suite for the procedure.  The right arm was prepped with chlorhexidine, draped in the usual sterile fashion using maximum barrier technique (cap and mask, sterile gown, sterile gloves, large sterile sheet, hand hygiene and cutaneous antisepsis) and infiltrated locally with 1% Lidocaine.  Ultrasound demonstrated patency of the right basilic vein, and this was documented with an image. Under real-time ultrasound guidance, this vein was accessed with a 21 gauge micropuncture needle and image documentation was performed. A 0.018 wire was introduced in to the vein. Over this, a 5 [place] double lumen power PICC was advanced to the lower SVC/right atrial junction. Fluoroscopy during the procedure and fluoro spot radiograph confirms appropriate catheter position. The catheter was flushed and covered with a sterile dressing.  COMPLICATIONS: None  LENGTH: 45 cm  IMPRESSION: Successful right arm power PICC line placement with ultrasound and fluoroscopic guidance. The catheter is ready for use.   Electronically Signed   By: Marybelle Killings M.D.   On: 08/06/2014 13:16   Ir US Guide Vasc Access Right  08/06/2014   CLINICAL DATA:  Sickle cell  EXAM: RIGHT UPPER EXTREMITY PICC LINE PLACEMENT WITH ULTRASOUND AND FLUOROSCOPIC GUIDANCE  FLUOROSCOPY TIME:   12 seconds  PROCEDURE: The patient was advised of the possible risks and complications and agreed to undergo the procedure. The patient was then brought to the angiographic suite for the procedure.  The right arm was prepped with chlorhexidine, draped in the usual sterile fashion using maximum barrier technique (cap and mask, sterile gown, sterile gloves, large sterile sheet, hand hygiene and cutaneous antisepsis) and infiltrated locally with 1% Lidocaine.  Ultrasound demonstrated patency of the right basilic vein, and this was documented with an image. Under real-time ultrasound guidance, this vein was accessed with a 21 gauge micropuncture needle and image documentation was performed. A 0.018 wire was introduced in to the vein. Over this, a 5 [place] double lumen power PICC was advanced to the lower SVC/right atrial junction. Fluoroscopy during the procedure and fluoro spot radiograph confirms appropriate catheter position. The catheter was flushed and covered with a sterile dressing.  COMPLICATIONS: None  LENGTH: 45 cm  IMPRESSION: Successful right arm power PICC line placement with ultrasound and fluoroscopic guidance. The catheter is ready for use.   Electronically Signed   By: Marybelle Killings M.D.   On: 08/06/2014 13:16   Dg Chest Port 1 View  08/05/2014   CLINICAL DATA:  Hypoxia.  Sickle cell pain crisis  EXAM: PORTABLE CHEST - 1 VIEW  COMPARISON:  08/03/2014  FINDINGS: Chronic cardiomegaly. Aortic and hilar contours are stable and negative. Stable left subclavian porta catheter, tip at the upper right atrium.  Chronic reticulation of lung markings consistent with scarring. There is no edema, consolidation, effusion, or pneumothorax.  No acute osseous findings.  IMPRESSION: Stable exam.  No evidence of acute cardiopulmonary disease.   Electronically Signed   By: Monte Fantasia M.D.   On: 08/05/2014 06:08    Microbiology: Recent Results (from the past 240 hour(s))  MRSA PCR Screening     Status: None    Collection Time: 08/05/14  9:09 AM  Result Value Ref Range Status   MRSA by PCR NEGATIVE NEGATIVE  Final    Comment:        The GeneXpert MRSA Assay (FDA approved for NASAL specimens only), is one component of a comprehensive MRSA colonization surveillance program. It is not intended to diagnose MRSA infection nor to guide or monitor treatment for MRSA infections.      Labs: Basic Metabolic Panel:  Recent Labs Lab 08/03/14 0858 08/05/14 0457 08/05/14 0500 08/06/14 0455  NA 140 139  --  139  K 3.3* 3.4*  --  4.1  CL 109 113*  --  112*  CO2 23 21*  --  21*  GLUCOSE 99 118*  --  127*  BUN 10 10  --  9  CREATININE 0.64 0.74  --  0.60*  CALCIUM 8.9 8.4*  --  8.6*  MG  --   --  1.8  --    Liver Function Tests:  Recent Labs Lab 08/03/14 0858 08/05/14 0457  AST 27 28  ALT 23 20  ALKPHOS 62 68  BILITOT 4.6* 4.4*  PROT 7.2 7.1  ALBUMIN 3.7 3.6   No results for input(s): LIPASE, AMYLASE in the last 168 hours. No results for input(s): AMMONIA in the last 168 hours. CBC:  Recent Labs Lab 08/03/14 0858 08/05/14 0457 08/06/14 0455 08/07/14 0525  WBC 17.9* 19.4* 18.2* 15.1*  NEUTROABS 13.0* 14.2* 13.0* 10.1*  HGB 7.2* 7.0* 6.9* 7.4*  HCT 21.6* 20.7* 20.4* 21.7*  MCV 94.3 93.2 91.9 88.6  PLT 547* 474* 459* 398   Cardiac Enzymes: No results for input(s): CKTOTAL, CKMB, CKMBINDEX, TROPONINI in the last 168 hours.  Time coordinating discharge: >1  hour  Signed:  Kalman Shan Sickle Cell Service 08/08/2014, 4:46 PM

## 2014-08-10 LAB — TYPE AND SCREEN
ABO/RH(D): O POS
Antibody Screen: NEGATIVE
Unit division: 0
Unit division: 0

## 2014-08-12 ENCOUNTER — Other Ambulatory Visit: Payer: Self-pay | Admitting: Family Medicine

## 2014-08-12 MED ORDER — ENOXAPARIN SODIUM 150 MG/ML ~~LOC~~ SOLN: 110.0000 mg | SUBCUTANEOUS | Status: DC

## 2014-08-12 NOTE — Telephone Encounter (Signed)
Refill for lovenox sent into pharmacy. Thanks!

## 2014-08-14 ENCOUNTER — Telehealth (HOSPITAL_COMMUNITY): Payer: Self-pay | Admitting: Hematology

## 2014-08-14 ENCOUNTER — Non-Acute Institutional Stay (HOSPITAL_COMMUNITY)
Admission: AD | Admit: 2014-08-14 | Discharge: 2014-08-14 | Disposition: A | Discharge status: Home / Self Care | Payer: Medicare Other | Source: Ambulatory Visit | Attending: Internal Medicine | Admitting: Internal Medicine

## 2014-08-14 ENCOUNTER — Encounter (HOSPITAL_COMMUNITY): Payer: Self-pay

## 2014-08-14 DIAGNOSIS — D5700 Hb-SS disease with crisis, unspecified: Secondary | ICD-10-CM | POA: Diagnosis not present

## 2014-08-14 DIAGNOSIS — M79606 Pain in leg, unspecified: Secondary | ICD-10-CM | POA: Diagnosis present

## 2014-08-14 DIAGNOSIS — Z7982 Long term (current) use of aspirin: Secondary | ICD-10-CM | POA: Diagnosis not present

## 2014-08-14 DIAGNOSIS — Z79891 Long term (current) use of opiate analgesic: Secondary | ICD-10-CM | POA: Insufficient documentation

## 2014-08-14 DIAGNOSIS — Z7901 Long term (current) use of anticoagulants: Secondary | ICD-10-CM | POA: Diagnosis not present

## 2014-08-14 DIAGNOSIS — Z79899 Other long term (current) drug therapy: Secondary | ICD-10-CM | POA: Diagnosis not present

## 2014-08-14 DIAGNOSIS — Z832 Family history of diseases of the blood and blood-forming organs and certain disorders involving the immune mechanism: Secondary | ICD-10-CM | POA: Insufficient documentation

## 2014-08-14 DIAGNOSIS — Z96641 Presence of right artificial hip joint: Secondary | ICD-10-CM | POA: Diagnosis not present

## 2014-08-14 DIAGNOSIS — Z86711 Personal history of pulmonary embolism: Secondary | ICD-10-CM | POA: Diagnosis not present

## 2014-08-14 DIAGNOSIS — Z87891 Personal history of nicotine dependence: Secondary | ICD-10-CM | POA: Insufficient documentation

## 2014-08-14 DIAGNOSIS — I10 Essential (primary) hypertension: Secondary | ICD-10-CM | POA: Diagnosis not present

## 2014-08-14 LAB — RETICULOCYTES
RBC.: 1.95 MIL/uL — ABNORMAL LOW (ref 4.22–5.81)
RETIC CT PCT: 12.1 % — AB (ref 0.4–3.1)
Retic Count, Absolute: 236 10*3/uL — ABNORMAL HIGH (ref 19.0–186.0)

## 2014-08-14 LAB — CBC WITH DIFFERENTIAL/PLATELET
BASOS PCT: 1 % (ref 0–1)
Basophils Absolute: 0.1 10*3/uL (ref 0.0–0.1)
EOS PCT: 3 % (ref 0–5)
Eosinophils Absolute: 0.4 10*3/uL (ref 0.0–0.7)
HCT: 18 % — ABNORMAL LOW (ref 39.0–52.0)
Hemoglobin: 6 g/dL — CL (ref 13.0–17.0)
LYMPHS ABS: 2.2 10*3/uL (ref 0.7–4.0)
Lymphocytes Relative: 17 % (ref 12–46)
MCH: 30.8 pg (ref 26.0–34.0)
MCHC: 33.3 g/dL (ref 30.0–36.0)
MCV: 92.3 fL (ref 78.0–100.0)
MONOS PCT: 11 % (ref 3–12)
Monocytes Absolute: 1.4 10*3/uL — ABNORMAL HIGH (ref 0.1–1.0)
Neutro Abs: 8.9 10*3/uL — ABNORMAL HIGH (ref 1.7–7.7)
Neutrophils Relative %: 69 % (ref 43–77)
PLATELETS: 469 10*3/uL — AB (ref 150–400)
RBC: 1.95 MIL/uL — ABNORMAL LOW (ref 4.22–5.81)
RDW: 20.1 % — ABNORMAL HIGH (ref 11.5–15.5)
WBC: 12.8 10*3/uL — AB (ref 4.0–10.5)

## 2014-08-14 LAB — COMPREHENSIVE METABOLIC PANEL
ALBUMIN: 3.2 g/dL — AB (ref 3.5–5.0)
ALT: 22 U/L (ref 17–63)
AST: 31 U/L (ref 15–41)
Alkaline Phosphatase: 74 U/L (ref 38–126)
Anion gap: 3 — ABNORMAL LOW (ref 5–15)
BUN: 7 mg/dL (ref 6–20)
CALCIUM: 7 mg/dL — AB (ref 8.9–10.3)
CO2: 20 mmol/L — ABNORMAL LOW (ref 22–32)
Chloride: 117 mmol/L — ABNORMAL HIGH (ref 101–111)
Creatinine, Ser: 0.42 mg/dL — ABNORMAL LOW (ref 0.61–1.24)
GFR calc Af Amer: >60 mL/min (ref >60)
Glucose, Bld: 79 mg/dL (ref 65–99)
POTASSIUM: 2.7 mmol/L — AB (ref 3.5–5.1)
Sodium: 140 mmol/L (ref 135–145)
TOTAL PROTEIN: 5.8 g/dL — AB (ref 6.5–8.1)
Total Bilirubin: 3 mg/dL — ABNORMAL HIGH (ref 0.3–1.2)

## 2014-08-14 LAB — LACTATE DEHYDROGENASE: LDH: 348 U/L — AB (ref 98–192)

## 2014-08-14 MED ORDER — DEXTROSE-NACL 5-0.45 % IV SOLN
INTRAVENOUS | Status: DC
  Administered 2014-08-14: 10:00:00 via INTRAVENOUS

## 2014-08-14 MED ORDER — FOLIC ACID 1 MG PO TABS
1.0000 mg | ORAL_TABLET | Freq: Every day | ORAL | Status: DC
  Administered 2014-08-14: 1 mg via ORAL
  Filled 2014-08-14: qty 1

## 2014-08-14 MED ORDER — HEPARIN SOD (PORK) LOCK FLUSH 100 UNIT/ML IV SOLN
500.0000 [IU] | INTRAVENOUS | Status: AC | PRN
  Administered 2014-08-14: 500 [IU]
  Filled 2014-08-14: qty 5

## 2014-08-14 MED ORDER — POTASSIUM CHLORIDE CRYS ER 20 MEQ PO TBCR
40.0000 meq | EXTENDED_RELEASE_TABLET | Freq: Once | ORAL | Status: AC
  Administered 2014-08-14: 40 meq via ORAL
  Filled 2014-08-14: qty 2

## 2014-08-14 MED ORDER — SODIUM CHLORIDE 0.9 % IJ SOLN: 9.0000 mL | INTRAMUSCULAR | Status: DC | PRN

## 2014-08-14 MED ORDER — SODIUM CHLORIDE 0.9 % IJ SOLN
10.0000 mL | INTRAMUSCULAR | Status: AC | PRN
  Administered 2014-08-14: 10 mL

## 2014-08-14 MED ORDER — DIPHENHYDRAMINE HCL 12.5 MG/5ML PO ELIX: 12.5000 mg | ORAL_SOLUTION | Freq: Four times a day (QID) | ORAL | Status: DC | PRN

## 2014-08-14 MED ORDER — SODIUM CHLORIDE 0.9 % IV SOLN
12.5000 mg | Freq: Four times a day (QID) | INTRAVENOUS | Status: DC | PRN
  Filled 2014-08-14: qty 0.25

## 2014-08-14 MED ORDER — ONDANSETRON HCL 4 MG/2ML IJ SOLN: 4.0000 mg | Freq: Four times a day (QID) | INTRAMUSCULAR | Status: DC | PRN

## 2014-08-14 MED ORDER — HYDROMORPHONE 2 MG/ML HIGH CONCENTRATION IV PCA SOLN
INTRAVENOUS | Status: DC
  Administered 2014-08-14: 10:00:00 via INTRAVENOUS
  Administered 2014-08-14: 10 mg via INTRAVENOUS
  Filled 2014-08-14: qty 25

## 2014-08-14 MED ORDER — SENNOSIDES-DOCUSATE SODIUM 8.6-50 MG PO TABS: 1.0000 | ORAL_TABLET | Freq: Two times a day (BID) | ORAL | Status: DC

## 2014-08-14 MED ORDER — NALOXONE HCL 0.4 MG/ML IJ SOLN: 0.4000 mg | INTRAMUSCULAR | Status: DC | PRN

## 2014-08-14 MED ORDER — HYDROMORPHONE HCL 4 MG PO TABS
4.0000 mg | ORAL_TABLET | Freq: Once | ORAL | Status: AC
  Administered 2014-08-14: 4 mg via ORAL
  Filled 2014-08-14: qty 1

## 2014-08-14 MED ORDER — KETOROLAC TROMETHAMINE 30 MG/ML IJ SOLN
30.0000 mg | Freq: Four times a day (QID) | INTRAMUSCULAR | Status: DC
  Administered 2014-08-14: 30 mg via INTRAVENOUS
  Filled 2014-08-14: qty 1

## 2014-08-14 MED ORDER — POLYETHYLENE GLYCOL 3350 17 G PO PACK: 17.0000 g | PACK | Freq: Every day | ORAL | Status: DC | PRN

## 2014-08-14 NOTE — Telephone Encounter (Signed)
Spoke with Dr. Jonelle Sidle, advised patient it is ok for him to come to [facility].  Patient verbalizes understanding.

## 2014-08-14 NOTE — Progress Notes (Signed)
CRITICAL VALUE ALERT  Critical value received:  hgb 6.0  Date of notification: 08/14/2014  Time of notification:  10:00  Critical value read back: yes  Nurse who received alert:  Rosalene Billings RN  MD notified (1st page):  Dr. Jonelle Sidle  Time of first page:  10:02  MD notified (2nd page):  Time of second page:  Responding MD:  Jonelle Sidle  Time MD responded:  10:22; Acknowledged, no new orders

## 2014-08-14 NOTE — Telephone Encounter (Signed)
Patient C/O pain 8/10 to ribs and legs.  Patient denies chest pain, nausea/vomiting/diarrhea.  Patient states he has taken his pain medication without any relief throughout the night.  Patient states that he has not been compliant with his Lovenox therapy since discharge.  I advised that I would notify the physician and give him a call back.  Patient verbalizes understanding.

## 2014-08-14 NOTE — Discharge Summary (Signed)
Physician Discharge Summary  Patient ID: [name] MRN: [id number] DOB/AGE: October 27, 1980 34 y.o.  Admit date: 08/14/2014 Discharge date: 08/14/2014  Admission Diagnoses:  Discharge Diagnoses:  Active Problems:   Hb-SS disease with crisis   Sickle cell disease with crisis   Discharged Condition: good  Hospital Course: A 34 yo admitted with sickle cell painful crisis. Patient treated at the day hospital with IV Dilaudid PCA, Toradol and IVF. Patient did very well and pain was down to his baseline and gaol. He was discharged hoem on his home medication to follow up with his primary care physician.   Consults: None  Significant Diagnostic Studies: labs: CBc and CMP checked and were within normal limits  Treatments: IV hydration and analgesia: Dilaudid  Discharge Exam: Blood pressure 96/80, pulse 72, temperature 97.7 F (36.5 C), temperature source Oral, resp. rate 24, height 6' (1.829 m), weight 73.483 kg (162 lb), SpO2 98 %. General appearance: alert, cooperative and no distress Eyes: conjunctivae/corneas clear. PERRL, EOM's intact. Fundi benign. Back: symmetric, no curvature. ROM normal. No CVA tenderness. Resp: clear to auscultation bilaterally Cardio: regular rate and rhythm, S1, S2 normal, no murmur, click, rub or gallop GI: soft, non-tender; bowel sounds normal; no masses,  no organomegaly Extremities: extremities normal, atraumatic, no cyanosis or edema Skin: Skin color, texture, turgor normal. No rashes or lesions  Disposition: 01-Home or Self Care     Medication List    TAKE these medications        aspirin 81 MG chewable tablet  Chew 1 tablet (81 mg total) by mouth daily.     diltiazem 120 MG 24 hr capsule  Commonly known as:  CARDIZEM CD  Take 1 capsule (120 mg total) by mouth daily.     enoxaparin 150 MG/ML injection  Commonly known as:  LOVENOX  Inject 0.73 mLs (110 mg total) into the skin daily. Starting tonight 07/23/2014 at 46:27OJ     folic acid  1 MG tablet  Commonly known as:  FOLVITE  Take 1 tablet (1 mg total) by mouth every morning.     HYDROmorphone 4 MG tablet  Commonly known as:  DILAUDID  Take 1 tablet (4 mg total) by mouth every 4 (four) hours as needed for severe pain.     hydroxyurea 500 MG capsule  Commonly known as:  HYDREA  Take 3 capsules (1,500 mg total) by mouth daily. May take with food to minimize GI side effects.     lisinopril 10 MG tablet  Commonly known as:  PRINIVIL,ZESTRIL  Take 1 tablet (10 mg total) by mouth daily.     metoprolol tartrate 25 MG tablet  Commonly known as:  LOPRESSOR  Take 1 tablet (25 mg total) by mouth daily.     morphine 30 MG 12 hr tablet  Commonly known as:  MS CONTIN  Take 1 tablet (30 mg total) by mouth every 12 (twelve) hours.     potassium chloride SA 20 MEQ tablet  Commonly known as:  K-DUR,KLOR-CON  Take 1 tablet (20 mEq total) by mouth every morning.     Vitamin D 2000 UNITS tablet  Take 1 tablet (2,000 Units total) by mouth daily.     zolpidem 10 MG tablet  Commonly known as:  AMBIEN  Take 1 tablet (10 mg total) by mouth at bedtime as needed for sleep.         SignedBarbette Merino 08/14/2014, 1:53 PM

## 2014-08-14 NOTE — Progress Notes (Signed)
CRITICAL VALUE ALERT  Critical value received:  K+ 2.7  Date of notification:  08/14/2014  Time of notification:10  :42  Critical value read back: Yes  Nurse who received alert:  Rosalene Billings  MD notified (1st page):  Jonelle Sidle  Time of first page:  10:43  MD notified (2nd page):  Time of second page:  Responding MD: Jonelle Sidle  Time MD responded:  10:44; orders received

## 2014-08-14 NOTE — H&P (Signed)
[name] is an 34 y.o. male.   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 No NVD, no shortness of breath.  Past Medical History  Diagnosis Date  . Sickle cell anemia   . Blood transfusion   . Acute embolism and thrombosis of right internal jugular vein   . Hypokalemia   . Mood disorder   . History of pulmonary embolus (PE)   . Avascular necrosis   . Leukocytosis     Chronic  . Thrombocytosis     Chronic  . Hypertension   . History of Clostridium difficile infection   . Uses marijuana   . Chronic anticoagulation   . Functional asplenia   . Former smoker   . Second hand tobacco smoke exposure   . Alcohol consumption of one to four drinks per day   . Noncompliance with medication regimen   . Sickle-cell crisis with associated acute chest syndrome 05/13/2013  . Acute chest syndrome 06/18/2013  . Demand ischemia 01/02/2014    Past Surgical History  Procedure Laterality Date  . Right hip replacement      08/2006  . Cholecystectomy      01/2008  . Porta cath placement    . Porta cath removal    . Umbilical hernia repair      01/2008  . Excision of left periauricular cyst      10/2009  . Excision of right ear lobe cyst with primary closur      11/2007  . Portacath placement  01/05/2012    Procedure: INSERTION PORT-A-CATH;  Surgeon: Odis Hollingshead, MD;  Location: [place];  Service: General;  Laterality: N/A;  ultrasound guiced port a cath insertion with fluoroscopy    Family History  Problem Relation Age of Onset  . Sickle cell trait Mother   . Depression Mother   .  Diabetes Mother   . Sickle cell trait Father   . Sickle cell trait Brother    Social History:  reports that he quit smoking about 4 years ago. He has never used smokeless tobacco. He reports that he uses illicit drugs (Marijuana) about twice per week. He reports that he does not drink alcohol.  Allergies: No Known Allergies  Medications Prior to Admission  Medication Sig Dispense Refill  . aspirin 81 MG chewable tablet Chew 1 tablet (81 mg total) by mouth daily. (Patient not taking: Reported on 08/05/2014) 30 tablet 11  . Cholecalciferol (VITAMIN D) 2000 UNITS tablet Take 1 tablet (2,000 Units total) by mouth daily. (Patient not taking: Reported on 08/05/2014) 90 tablet 3  . diltiazem (CARDIZEM CD) 120 MG 24 hr capsule Take 1 capsule (120 mg total) by mouth daily. 30 capsule 1  . enoxaparin (LOVENOX) 150 MG/ML injection Inject 0.73 mLs (110 mg total) into the skin daily. Starting tonight 07/23/2014 at 10:00pm 22 Syringe 5  . folic acid (FOLVITE) 1 MG tablet Take 1 tablet (1 mg total) by mouth every morning. 30 tablet 11  . HYDROmorphone (DILAUDID) 4 MG tablet Take 1 tablet (4 mg total) by  mouth every 4 (four) hours as needed for severe pain. 90 tablet 0  . hydroxyurea (HYDREA) 500 MG capsule Take 3 capsules (1,500 mg total) by mouth daily. May take with food to minimize GI side effects. 60 capsule 3  . lisinopril (PRINIVIL,ZESTRIL) 10 MG tablet Take 1 tablet (10 mg total) by mouth daily. 30 tablet 1  . metoprolol tartrate (LOPRESSOR) 25 MG tablet Take 1 tablet (25 mg total) by mouth daily. 30 tablet 6  . morphine (MS CONTIN) 30 MG 12 hr tablet Take 1 tablet (30 mg total) by mouth every 12 (twelve) hours. 60 tablet 0  . potassium chloride SA (K-DUR,KLOR-CON) 20 MEQ tablet Take 1 tablet (20 mEq total) by mouth every morning. 30 tablet 3  . zolpidem (AMBIEN) 10 MG tablet Take 1 tablet (10 mg total) by mouth at bedtime as needed for sleep. 30 tablet 0    No results found. However, due to the size of  the patient record, not all encounters were searched. Please check Results Review for a complete set of results. No results found.  Review of Systems  Constitutional: Negative.   HENT: Negative.   Eyes: Negative.   Respiratory: Negative.   Cardiovascular: Negative.   Gastrointestinal: Negative.   Genitourinary: Negative.   Musculoskeletal: Positive for myalgias, back pain and joint pain. Negative for falls.  Skin: Negative.   Neurological: Negative.   Endo/Heme/Allergies: Negative.   Psychiatric/Behavioral: Negative.     There were no vitals taken for this visit. Physical Exam  Constitutional: He is oriented to person, place, and time. He appears well-developed and well-nourished.  HENT:  Head: Normocephalic and atraumatic.  Eyes: Conjunctivae and EOM are normal. Pupils are equal, round, and reactive to light.  Neck: Normal range of motion. Neck supple.  Cardiovascular: Normal rate, regular rhythm and normal heart sounds.   Respiratory: Effort normal and breath sounds normal.  GI: Soft. Bowel sounds are normal.  Musculoskeletal: Normal range of motion.  Neurological: He is alert and oriented to person, place, and time. He has normal reflexes.  Skin: Skin is warm and dry.     Assessment/Plan A 34 yo with known sickle cell disease here with sickle cell painful crisis.   #1. Sickle cell Painful crisis: Patient will be admitted to the day hospital, will start IV diludid PCA, Toradol, IVf and close monitoring.  #2. Sickle Cell anemia: H/H will be monitored.  #3. HTN: will continue home medications while here.  $4. Chronic anticoagulation: Continue Xarelto  [name],[name] 08/14/2014, 8:47 AM

## 2014-08-14 NOTE — Progress Notes (Signed)
Patient seen in day hospital for treatment today. Pain at goal 4/10. Patient alert oriented and ambulatory at discharge.

## 2014-08-18 ENCOUNTER — Non-Acute Institutional Stay (HOSPITAL_COMMUNITY)
Admission: AD | Admit: 2014-08-18 | Discharge: 2014-08-18 | Disposition: A | Discharge status: Home / Self Care | Payer: Medicare Other | Attending: Internal Medicine | Admitting: Internal Medicine

## 2014-08-18 ENCOUNTER — Ambulatory Visit: Payer: Self-pay | Admitting: Family Medicine

## 2014-08-18 ENCOUNTER — Telehealth (HOSPITAL_COMMUNITY): Payer: Self-pay | Admitting: Internal Medicine

## 2014-08-18 ENCOUNTER — Encounter (HOSPITAL_COMMUNITY): Payer: Self-pay | Admitting: *Deleted

## 2014-08-18 DIAGNOSIS — D57819 Other sickle-cell disorders with crisis, unspecified: Secondary | ICD-10-CM

## 2014-08-18 DIAGNOSIS — Z79891 Long term (current) use of opiate analgesic: Secondary | ICD-10-CM | POA: Diagnosis not present

## 2014-08-18 DIAGNOSIS — Z79899 Other long term (current) drug therapy: Secondary | ICD-10-CM | POA: Insufficient documentation

## 2014-08-18 DIAGNOSIS — D5700 Hb-SS disease with crisis, unspecified: Secondary | ICD-10-CM | POA: Insufficient documentation

## 2014-08-18 DIAGNOSIS — E860 Dehydration: Secondary | ICD-10-CM | POA: Diagnosis not present

## 2014-08-18 DIAGNOSIS — I10 Essential (primary) hypertension: Secondary | ICD-10-CM | POA: Insufficient documentation

## 2014-08-18 DIAGNOSIS — Z96641 Presence of right artificial hip joint: Secondary | ICD-10-CM | POA: Insufficient documentation

## 2014-08-18 DIAGNOSIS — Z87891 Personal history of nicotine dependence: Secondary | ICD-10-CM | POA: Diagnosis not present

## 2014-08-18 DIAGNOSIS — M549 Dorsalgia, unspecified: Secondary | ICD-10-CM | POA: Diagnosis present

## 2014-08-18 DIAGNOSIS — D571 Sickle-cell disease without crisis: Secondary | ICD-10-CM | POA: Diagnosis present

## 2014-08-18 DIAGNOSIS — Z86711 Personal history of pulmonary embolism: Secondary | ICD-10-CM | POA: Diagnosis not present

## 2014-08-18 DIAGNOSIS — Z7982 Long term (current) use of aspirin: Secondary | ICD-10-CM | POA: Insufficient documentation

## 2014-08-18 DIAGNOSIS — Z7901 Long term (current) use of anticoagulants: Secondary | ICD-10-CM | POA: Diagnosis not present

## 2014-08-18 LAB — COMPREHENSIVE METABOLIC PANEL
ALT: 26 U/L (ref 17–63)
ANION GAP: 4 — AB (ref 5–15)
AST: 30 U/L (ref 15–41)
Albumin: 4.1 g/dL (ref 3.5–5.0)
Alkaline Phosphatase: 79 U/L (ref 38–126)
BILIRUBIN TOTAL: 4.3 mg/dL — AB (ref 0.3–1.2)
BUN: 6 mg/dL (ref 6–20)
CALCIUM: 8.5 mg/dL — AB (ref 8.9–10.3)
CO2: 23 mmol/L (ref 22–32)
CREATININE: 0.61 mg/dL (ref 0.61–1.24)
Chloride: 110 mmol/L (ref 101–111)
GFR calc Af Amer: >60 mL/min (ref >60)
GFR calc non Af Amer: >60 mL/min (ref >60)
GLUCOSE: 95 mg/dL (ref 65–99)
Potassium: 3.7 mmol/L (ref 3.5–5.1)
Sodium: 137 mmol/L (ref 135–145)
Total Protein: 7.4 g/dL (ref 6.5–8.1)

## 2014-08-18 LAB — RETICULOCYTES
RBC.: 2.54 MIL/uL — ABNORMAL LOW (ref 4.22–5.81)
Retic Count, Absolute: 360.7 10*3/uL — ABNORMAL HIGH (ref 19.0–186.0)
Retic Ct Pct: 14.2 % — ABNORMAL HIGH (ref 0.4–3.1)

## 2014-08-18 LAB — CBC WITH DIFFERENTIAL/PLATELET
Basophils Absolute: 0.1 10*3/uL (ref 0.0–0.1)
Basophils Relative: 1 % (ref 0–1)
Eosinophils Absolute: 0.5 10*3/uL (ref 0.0–0.7)
Eosinophils Relative: 3 % (ref 0–5)
HEMATOCRIT: 24.1 % — AB (ref 39.0–52.0)
Hemoglobin: 7.9 g/dL — ABNORMAL LOW (ref 13.0–17.0)
LYMPHS PCT: 19 % (ref 12–46)
Lymphs Abs: 3 10*3/uL (ref 0.7–4.0)
MCH: 31.1 pg (ref 26.0–34.0)
MCHC: 32.8 g/dL (ref 30.0–36.0)
MCV: 94.9 fL (ref 78.0–100.0)
Monocytes Absolute: 1.6 10*3/uL — ABNORMAL HIGH (ref 0.1–1.0)
Monocytes Relative: 10 % (ref 3–12)
Neutro Abs: 10.7 10*3/uL — ABNORMAL HIGH (ref 1.7–7.7)
Neutrophils Relative %: 67 % (ref 43–77)
Platelets: 512 10*3/uL — ABNORMAL HIGH (ref 150–400)
RBC: 2.54 MIL/uL — ABNORMAL LOW (ref 4.22–5.81)
RDW: 20.4 % — AB (ref 11.5–15.5)
WBC: 15.8 10*3/uL — ABNORMAL HIGH (ref 4.0–10.5)

## 2014-08-18 LAB — LACTATE DEHYDROGENASE: LDH: 357 U/L — ABNORMAL HIGH (ref 98–192)

## 2014-08-18 MED ORDER — DIPHENHYDRAMINE HCL 50 MG/ML IJ SOLN
12.5000 mg | Freq: Four times a day (QID) | INTRAMUSCULAR | Status: DC | PRN
  Filled 2014-08-18: qty 0.25

## 2014-08-18 MED ORDER — DEXTROSE-NACL 5-0.45 % IV SOLN
INTRAVENOUS | Status: DC
Start: 2014-08-18 — End: 2014-08-19
  Administered 2014-08-18: 10:00:00 via INTRAVENOUS

## 2014-08-18 MED ORDER — ONDANSETRON HCL 4 MG/2ML IJ SOLN: 4.0000 mg | Freq: Four times a day (QID) | INTRAMUSCULAR | Status: DC | PRN

## 2014-08-18 MED ORDER — POLYETHYLENE GLYCOL 3350 17 G PO PACK
17.0000 g | PACK | Freq: Every day | ORAL | Status: DC | PRN
Start: 2014-08-18 — End: 2014-08-19

## 2014-08-18 MED ORDER — SODIUM CHLORIDE 0.9 % IJ SOLN
10.0000 mL | INTRAMUSCULAR | Status: AC | PRN
  Administered 2014-08-18: 10 mL

## 2014-08-18 MED ORDER — HEPARIN SOD (PORK) LOCK FLUSH 100 UNIT/ML IV SOLN
500.0000 [IU] | INTRAVENOUS | Status: AC | PRN
Start: 2014-08-18 — End: 2014-08-18
  Administered 2014-08-18: 500 [IU]
  Filled 2014-08-18: qty 5

## 2014-08-18 MED ORDER — DIPHENHYDRAMINE HCL 12.5 MG/5ML PO ELIX: 12.5000 mg | ORAL_SOLUTION | Freq: Four times a day (QID) | ORAL | Status: DC | PRN

## 2014-08-18 MED ORDER — HYDROMORPHONE 2 MG/ML HIGH CONCENTRATION IV PCA SOLN
INTRAVENOUS | Status: DC
  Administered 2014-08-18: 9 mg via INTRAVENOUS
  Administered 2014-08-18: 10:00:00 via INTRAVENOUS
  Filled 2014-08-18: qty 25

## 2014-08-18 MED ORDER — FOLIC ACID 1 MG PO TABS: 1.0000 mg | ORAL_TABLET | Freq: Every day | ORAL | Status: DC

## 2014-08-18 MED ORDER — NALOXONE HCL 0.4 MG/ML IJ SOLN: 0.4000 mg | INTRAMUSCULAR | Status: DC | PRN

## 2014-08-18 MED ORDER — SODIUM CHLORIDE 0.9 % IJ SOLN: 9.0000 mL | INTRAMUSCULAR | Status: DC | PRN

## 2014-08-18 MED ORDER — KETOROLAC TROMETHAMINE 30 MG/ML IJ SOLN
30.0000 mg | Freq: Four times a day (QID) | INTRAMUSCULAR | Status: DC
  Administered 2014-08-18: 30 mg via INTRAVENOUS
  Filled 2014-08-18: qty 1

## 2014-08-18 MED ORDER — SENNOSIDES-DOCUSATE SODIUM 8.6-50 MG PO TABS: 1.0000 | ORAL_TABLET | Freq: Two times a day (BID) | ORAL | Status: DC

## 2014-08-18 NOTE — H&P (Signed)
[name] is an 34 y.o. male.   Chief Complaint: Pain in arms and back x 2 days HPI: A 34 yo with known sickle cell disease here with pain in back and legs for 2 days not responding to his home medications. Pain is rated at 9/10, involving his back, arms and legs. No fever, no NVD no abdominal pain. Pain is similar to his typical sickle cell crisis.  Past Medical History  Diagnosis Date  . Sickle cell anemia   . Blood transfusion   . Acute embolism and thrombosis of right internal jugular vein   . Hypokalemia   . Mood disorder   . History of pulmonary embolus (PE)   . Avascular necrosis   . Leukocytosis     Chronic  . Thrombocytosis     Chronic  . Hypertension   . History of Clostridium difficile infection   . Uses marijuana   . Chronic anticoagulation   . Functional asplenia   . Former smoker   . Second hand tobacco smoke exposure   . Alcohol consumption of one to four drinks per day   . Noncompliance with medication regimen   . Sickle-cell crisis with associated acute chest syndrome 05/13/2013  . Acute chest syndrome 06/18/2013  . Demand ischemia 01/02/2014    Past Surgical History  Procedure Laterality Date  . Right hip replacement      08/2006  . Cholecystectomy      01/2008  . Porta cath placement    . Porta cath removal    . Umbilical hernia repair      01/2008  . Excision of left periauricular cyst      10/2009  . Excision of right ear lobe cyst with primary closur      11/2007  . Portacath placement  01/05/2012    Procedure: INSERTION PORT-A-CATH;  Surgeon: Odis Hollingshead, MD;  Location: [place];  Service: General;  Laterality: N/A;  ultrasound guiced port a cath insertion with fluoroscopy    Family History  Problem Relation Age of Onset  . Sickle cell trait Mother   . Depression Mother   . Diabetes Mother   . Sickle cell trait Father   . Sickle cell trait Brother    Social History:  reports that he quit smoking about 4 years ago. He has never used  smokeless tobacco. He reports that he uses illicit drugs (Marijuana) about twice per week. He reports that he does not drink alcohol.  Allergies: No Known Allergies  Medications Prior to Admission  Medication Sig Dispense Refill  . aspirin 81 MG chewable tablet Chew 1 tablet (81 mg total) by mouth daily. (Patient not taking: Reported on 08/05/2014) 30 tablet 11  . Cholecalciferol (VITAMIN D) 2000 UNITS tablet Take 1 tablet (2,000 Units total) by mouth daily. 90 tablet 3  . diltiazem (CARDIZEM CD) 120 MG 24 hr capsule Take 1 capsule (120 mg total) by mouth daily. 30 capsule 1  . enoxaparin (LOVENOX) 150 MG/ML injection Inject 0.73 mLs (110 mg total) into the skin daily. Starting tonight 07/23/2014 at 10:00pm 22 Syringe 5  . folic acid (FOLVITE) 1 MG tablet Take 1 tablet (1 mg total) by mouth every morning. 30 tablet 11  . HYDROmorphone (DILAUDID) 4 MG tablet Take 1 tablet (4 mg total) by mouth every 4 (four) hours as needed for severe pain. 90 tablet 0  . hydroxyurea (HYDREA) 500 MG capsule Take 3 capsules (1,500 mg total) by mouth daily. May take with food  to minimize GI side effects. 60 capsule 3  . lisinopril (PRINIVIL,ZESTRIL) 10 MG tablet Take 1 tablet (10 mg total) by mouth daily. 30 tablet 1  . metoprolol tartrate (LOPRESSOR) 25 MG tablet Take 1 tablet (25 mg total) by mouth daily. 30 tablet 6  . morphine (MS CONTIN) 30 MG 12 hr tablet Take 1 tablet (30 mg total) by mouth every 12 (twelve) hours. 60 tablet 0  . potassium chloride SA (K-DUR,KLOR-CON) 20 MEQ tablet Take 1 tablet (20 mEq total) by mouth every morning. 30 tablet 3  . zolpidem (AMBIEN) 10 MG tablet Take 1 tablet (10 mg total) by mouth at bedtime as needed for sleep. 30 tablet 0    No results found. However, due to the size of the patient record, not all encounters were searched. Please check Results Review for a complete set of results. No results found.  Review of Systems  Constitutional: Negative.   Eyes: Negative.    Respiratory: Negative.   Cardiovascular: Negative.   Gastrointestinal: Negative.   Genitourinary: Negative.   Musculoskeletal: Positive for myalgias, back pain, joint pain and neck pain.  Skin: Negative.   Neurological: Negative.   Endo/Heme/Allergies: Negative.   Psychiatric/Behavioral: Negative.     Blood pressure 121/69, pulse 70, temperature 98.6 F (37 C), resp. rate 20, height 6' (1.829 m), weight 73.936 kg (163 lb), SpO2 97 %. Physical Exam  Constitutional: He is oriented to person, place, and time. He appears well-developed and well-nourished.  HENT:  Head: Normocephalic and atraumatic.  Eyes: Conjunctivae and EOM are normal. Pupils are equal, round, and reactive to light.  Neck: Normal range of motion. Neck supple.  Cardiovascular: Normal rate and regular rhythm.   Respiratory: Effort normal and breath sounds normal.  GI: Soft. Bowel sounds are normal.  Musculoskeletal: Normal range of motion.  Neurological: He is alert and oriented to person, place, and time.  Skin: Skin is warm and dry.     Assessment/Plan A 34 yo ma here at day hospital with sickle cell painful crisis.  #1. Sickle Cell painful crisis: Patient will be admitted to the day hospital, will be treated with IV Dilaudid PCA, Toradol and IVF. Will try and control pain to atleast 4/10.  #2. Sickle Cell Anemia: H/H stable.  #3. Dehydration: will hydrate aggressively  [name],[name] 08/18/2014, 9:39 AM

## 2014-08-18 NOTE — Progress Notes (Signed)
Pt states his pain is now a 4 out of 10. Dr. Jonelle Sidle in to see patient and left orders for discharge. Dilaudid pca discontinued. Porta cath flushed with saline and heparin and deaccessed. Pt alert and oriented and ambulatory.

## 2014-08-18 NOTE — Telephone Encounter (Signed)
Pt states experiencing pain in side and back; rates pain 9/10; states taken home medications with no relief; denies SOB, CP, nausea, vomiting, or diarrhea; MD notified

## 2014-08-18 NOTE — Discharge Summary (Signed)
Physician Discharge Summary  Patient ID: [name] MRN: [id number] DOB/AGE: Sep 13, 1980 34 y.o.  Admit date: 08/18/2014 Discharge date: 08/18/2014  Admission Diagnoses:  Discharge Diagnoses:  Active Problems:   Sickle cell anemia   Sickle-cell disease with pain   Sickle cell disease with crisis   Discharged Condition: good  Hospital Course: Patient admitted with sickle cell painful crisis. Was treated with IV Dilaudid PCA, toradol and IVF. His pain went down from 10/10 to 4/10. He was feeling better and back to his baseline so was discharged home to follow up with his PCP.  Consults: None  Significant Diagnostic Studies: labs: CBC and CMP checked and within normal limits.  Treatments: IV hydration and analgesia: Dilaudid  Discharge Exam: Blood pressure 126/79, pulse 84, temperature 98.6 F (37 C), resp. rate 20, height 6' (1.829 m), weight 73.936 kg (163 lb), SpO2 94 %. General appearance: alert, cooperative, appears stated age and no distress Eyes: conjunctivae/corneas clear. PERRL, EOM's intact. Fundi benign. Neck: no adenopathy, no carotid bruit, no JVD, supple, symmetrical, trachea midline and thyroid not enlarged, symmetric, no tenderness/mass/nodules Resp: clear to auscultation bilaterally Cardio: regular rate and rhythm, S1, S2 normal, no murmur, click, rub or gallop GI: soft, non-tender; bowel sounds normal; no masses,  no organomegaly Extremities: extremities normal, atraumatic, no cyanosis or edema Skin: Skin color, texture, turgor normal. No rashes or lesions  Disposition: 01-Home or Self Care     Medication List    TAKE these medications        aspirin 81 MG chewable tablet  Chew 1 tablet (81 mg total) by mouth daily.     diltiazem 120 MG 24 hr capsule  Commonly known as:  CARDIZEM CD  Take 1 capsule (120 mg total) by mouth daily.     enoxaparin 150 MG/ML injection  Commonly known as:  LOVENOX  Inject 0.73 mLs (110 mg total) into the skin daily.  Starting tonight 07/23/2014 at 63:33LK     folic acid 1 MG tablet  Commonly known as:  FOLVITE  Take 1 tablet (1 mg total) by mouth every morning.     HYDROmorphone 4 MG tablet  Commonly known as:  DILAUDID  Take 1 tablet (4 mg total) by mouth every 4 (four) hours as needed for severe pain.     hydroxyurea 500 MG capsule  Commonly known as:  HYDREA  Take 3 capsules (1,500 mg total) by mouth daily. May take with food to minimize GI side effects.     lisinopril 10 MG tablet  Commonly known as:  PRINIVIL,ZESTRIL  Take 1 tablet (10 mg total) by mouth daily.     metoprolol tartrate 25 MG tablet  Commonly known as:  LOPRESSOR  Take 1 tablet (25 mg total) by mouth daily.     morphine 30 MG 12 hr tablet  Commonly known as:  MS CONTIN  Take 1 tablet (30 mg total) by mouth every 12 (twelve) hours.     potassium chloride SA 20 MEQ tablet  Commonly known as:  K-DUR,KLOR-CON  Take 1 tablet (20 mEq total) by mouth every morning.     Vitamin D 2000 UNITS tablet  Take 1 tablet (2,000 Units total) by mouth daily.     zolpidem 10 MG tablet  Commonly known as:  AMBIEN  Take 1 tablet (10 mg total) by mouth at bedtime as needed for sleep.         SignedBarbette Merino 08/18/2014, 3:08 PM

## 2014-08-18 NOTE — Telephone Encounter (Signed)
Notified patient that, per MD, he may come to the day hospital for evaluation; pt verbalizes understanding

## 2014-08-21 ENCOUNTER — Emergency Department (HOSPITAL_COMMUNITY)
Admission: EM | Admit: 2014-08-21 | Discharge: 2014-08-21 | Disposition: A | Discharge status: Home / Self Care | Payer: Medicare Other | Source: Home / Self Care | Attending: Emergency Medicine | Admitting: Emergency Medicine

## 2014-08-21 ENCOUNTER — Encounter (HOSPITAL_COMMUNITY): Payer: Self-pay | Admitting: Emergency Medicine

## 2014-08-21 DIAGNOSIS — Z9114 Patient's other noncompliance with medication regimen: Secondary | ICD-10-CM | POA: Diagnosis present

## 2014-08-21 DIAGNOSIS — I272 Other secondary pulmonary hypertension: Secondary | ICD-10-CM | POA: Diagnosis present

## 2014-08-21 DIAGNOSIS — Z7982 Long term (current) use of aspirin: Secondary | ICD-10-CM

## 2014-08-21 DIAGNOSIS — D5700 Hb-SS disease with crisis, unspecified: Secondary | ICD-10-CM | POA: Diagnosis not present

## 2014-08-21 DIAGNOSIS — I5022 Chronic systolic (congestive) heart failure: Secondary | ICD-10-CM | POA: Diagnosis present

## 2014-08-21 DIAGNOSIS — Z87891 Personal history of nicotine dependence: Secondary | ICD-10-CM

## 2014-08-21 DIAGNOSIS — Z96641 Presence of right artificial hip joint: Secondary | ICD-10-CM | POA: Diagnosis present

## 2014-08-21 DIAGNOSIS — I2781 Cor pulmonale (chronic): Secondary | ICD-10-CM | POA: Diagnosis not present

## 2014-08-21 DIAGNOSIS — Z8619 Personal history of other infectious and parasitic diseases: Secondary | ICD-10-CM

## 2014-08-21 DIAGNOSIS — D638 Anemia in other chronic diseases classified elsewhere: Secondary | ICD-10-CM | POA: Diagnosis not present

## 2014-08-21 DIAGNOSIS — Z79899 Other long term (current) drug therapy: Secondary | ICD-10-CM | POA: Insufficient documentation

## 2014-08-21 DIAGNOSIS — I10 Essential (primary) hypertension: Secondary | ICD-10-CM | POA: Diagnosis present

## 2014-08-21 DIAGNOSIS — Z7901 Long term (current) use of anticoagulants: Secondary | ICD-10-CM

## 2014-08-21 DIAGNOSIS — Z86711 Personal history of pulmonary embolism: Secondary | ICD-10-CM

## 2014-08-21 DIAGNOSIS — R0602 Shortness of breath: Secondary | ICD-10-CM | POA: Diagnosis not present

## 2014-08-21 DIAGNOSIS — R0902 Hypoxemia: Secondary | ICD-10-CM | POA: Diagnosis present

## 2014-08-21 DIAGNOSIS — Z79891 Long term (current) use of opiate analgesic: Secondary | ICD-10-CM | POA: Diagnosis not present

## 2014-08-21 DIAGNOSIS — F39 Unspecified mood [affective] disorder: Secondary | ICD-10-CM | POA: Diagnosis present

## 2014-08-21 DIAGNOSIS — D72829 Elevated white blood cell count, unspecified: Secondary | ICD-10-CM | POA: Diagnosis present

## 2014-08-21 DIAGNOSIS — Z9049 Acquired absence of other specified parts of digestive tract: Secondary | ICD-10-CM | POA: Diagnosis present

## 2014-08-21 DIAGNOSIS — Z8659 Personal history of other mental and behavioral disorders: Secondary | ICD-10-CM | POA: Insufficient documentation

## 2014-08-21 DIAGNOSIS — M25572 Pain in left ankle and joints of left foot: Secondary | ICD-10-CM | POA: Diagnosis not present

## 2014-08-21 DIAGNOSIS — E876 Hypokalemia: Secondary | ICD-10-CM

## 2014-08-21 DIAGNOSIS — M7989 Other specified soft tissue disorders: Secondary | ICD-10-CM | POA: Diagnosis not present

## 2014-08-21 DIAGNOSIS — Q8901 Asplenia (congenital): Secondary | ICD-10-CM

## 2014-08-21 DIAGNOSIS — M79605 Pain in left leg: Secondary | ICD-10-CM | POA: Diagnosis not present

## 2014-08-21 DIAGNOSIS — R600 Localized edema: Secondary | ICD-10-CM | POA: Diagnosis not present

## 2014-08-21 LAB — COMPREHENSIVE METABOLIC PANEL
ALT: 26 U/L (ref 17–63)
ANION GAP: 5 (ref 5–15)
AST: 37 U/L (ref 15–41)
Albumin: 4.1 g/dL (ref 3.5–5.0)
Alkaline Phosphatase: 80 U/L (ref 38–126)
BILIRUBIN TOTAL: 6 mg/dL — AB (ref 0.3–1.2)
BUN: 7 mg/dL (ref 6–20)
CHLORIDE: 110 mmol/L (ref 101–111)
CO2: 25 mmol/L (ref 22–32)
CREATININE: 0.58 mg/dL — AB (ref 0.61–1.24)
Calcium: 8.7 mg/dL — ABNORMAL LOW (ref 8.9–10.3)
GFR calc non Af Amer: >60 mL/min (ref >60)
Glucose, Bld: 116 mg/dL — ABNORMAL HIGH (ref 65–99)
Potassium: 3.8 mmol/L (ref 3.5–5.1)
SODIUM: 140 mmol/L (ref 135–145)
TOTAL PROTEIN: 7.6 g/dL (ref 6.5–8.1)

## 2014-08-21 LAB — CBC WITH DIFFERENTIAL/PLATELET
BASOS ABS: 0.1 10*3/uL (ref 0.0–0.1)
Basophils Relative: 1 % (ref 0–1)
EOS ABS: 0.5 10*3/uL (ref 0.0–0.7)
EOS PCT: 3 % (ref 0–5)
HCT: 22.6 % — ABNORMAL LOW (ref 39.0–52.0)
Hemoglobin: 7.4 g/dL — ABNORMAL LOW (ref 13.0–17.0)
Lymphocytes Relative: 20 % (ref 12–46)
Lymphs Abs: 3.1 10*3/uL (ref 0.7–4.0)
MCH: 30.6 pg (ref 26.0–34.0)
MCHC: 32.7 g/dL (ref 30.0–36.0)
MCV: 93.4 fL (ref 78.0–100.0)
MONO ABS: 2.1 10*3/uL — AB (ref 0.1–1.0)
Monocytes Relative: 14 % — ABNORMAL HIGH (ref 3–12)
Neutro Abs: 9.7 10*3/uL — ABNORMAL HIGH (ref 1.7–7.7)
Neutrophils Relative %: 62 % (ref 43–77)
PLATELETS: 415 10*3/uL — AB (ref 150–400)
RBC: 2.42 MIL/uL — AB (ref 4.22–5.81)
RDW: 20.3 % — ABNORMAL HIGH (ref 11.5–15.5)
WBC: 15.5 10*3/uL — AB (ref 4.0–10.5)

## 2014-08-21 MED ORDER — HYDROMORPHONE HCL 2 MG/ML IJ SOLN
2.0000 mg | INTRAMUSCULAR | Status: AC | PRN
  Administered 2014-08-21 (×3): 2 mg via INTRAVENOUS
  Filled 2014-08-21 (×3): qty 1

## 2014-08-21 MED ORDER — DIPHENHYDRAMINE HCL 50 MG/ML IJ SOLN
25.0000 mg | Freq: Once | INTRAMUSCULAR | Status: AC
Start: 2014-08-21 — End: 2014-08-21
  Administered 2014-08-21: 25 mg via INTRAVENOUS
  Filled 2014-08-21: qty 1

## 2014-08-21 MED ORDER — KETOROLAC TROMETHAMINE 30 MG/ML IJ SOLN
30.0000 mg | Freq: Once | INTRAMUSCULAR | Status: AC
  Administered 2014-08-21: 30 mg via INTRAVENOUS
  Filled 2014-08-21: qty 1

## 2014-08-21 MED ORDER — HEPARIN SOD (PORK) LOCK FLUSH 100 UNIT/ML IV SOLN
500.0000 [IU] | Freq: Once | INTRAVENOUS | Status: AC
  Administered 2014-08-21: 500 [IU]
  Filled 2014-08-21: qty 5

## 2014-08-21 NOTE — ED Notes (Signed)
Pt does not want his blood to be drawn unless his port is accessed

## 2014-08-21 NOTE — ED Notes (Signed)
Pt from home c/o lower back pain and bilaterally leg pain x 2 days. HX of sickle cell. He took MS contin and dilaudid without relief.

## 2014-08-21 NOTE — ED Provider Notes (Signed)
CSN: [id number]     Arrival date & time 08/21/14  0007 History  This chart was scribed for Debby Freiberg, MD by Randa Evens, ED Scribe. This patient was seen in room WA25/WA25 and the patient's care was started at 2:27 AM.     Chief Complaint  Patient presents with  . Sickle Cell Pain Crisis   Patient is a 34 y.o. male presenting with sickle cell pain. The history is provided by the patient and medical records. No language interpreter was used.  Sickle Cell Pain Crisis Location:  Lower extremity and R side Severity:  Moderate Onset quality:  Gradual Duration:  1 week Similar to previous crisis episodes: yes   Timing:  Intermittent Chronicity:  Chronic History of pulmonary emboli: yes   Relieved by:  Nothing Ineffective treatments:  Prescription drugs Associated symptoms: no chest pain and no shortness of breath    HPI Comments: [name] is a 34 y.o. male who presents to the Emergency Department complaining of intermittent sickle cell pain in his right side ribs and bilateral thighs onset 1 week prior. Pt states that the pain feels like similar sickle cell pain. Per nurses note pt has tried MS contin and dilaudid with no relief. Pt denies CP or SOB.   Past Medical History  Diagnosis Date  . Sickle cell anemia   . Blood transfusion   . Acute embolism and thrombosis of right internal jugular vein   . Hypokalemia   . Mood disorder   . History of pulmonary embolus (PE)   . Avascular necrosis   . Leukocytosis     Chronic  . Thrombocytosis     Chronic  . Hypertension   . History of Clostridium difficile infection   . Uses marijuana   . Chronic anticoagulation   . Functional asplenia   . Former smoker   . Second hand tobacco smoke exposure   . Alcohol consumption of one to four drinks per day   . Noncompliance with medication regimen   . Sickle-cell crisis with associated acute chest syndrome 05/13/2013  . Acute chest syndrome 06/18/2013  . Demand ischemia  01/02/2014   Past Surgical History  Procedure Laterality Date  . Right hip replacement      08/2006  . Cholecystectomy      01/2008  . Porta cath placement    . Porta cath removal    . Umbilical hernia repair      01/2008  . Excision of left periauricular cyst      10/2009  . Excision of right ear lobe cyst with primary closur      11/2007  . Portacath placement  01/05/2012    Procedure: INSERTION PORT-A-CATH;  Surgeon: Odis Hollingshead, MD;  Location: [place];  Service: General;  Laterality: N/A;  ultrasound guiced port a cath insertion with fluoroscopy   Family History  Problem Relation Age of Onset  . Sickle cell trait Mother   . Depression Mother   . Diabetes Mother   . Sickle cell trait Father   . Sickle cell trait Brother    History  Substance Use Topics  . Smoking status: Former Smoker -- 13 years    Quit date: 07/08/2010  . Smokeless tobacco: Never Used  . Alcohol Use: No    Review of Systems  Respiratory: Negative for shortness of breath.   Cardiovascular: Negative for chest pain.  All other systems reviewed and are negative.     Allergies  Review of patient's allergies  indicates no known allergies.  Home Medications   Prior to Admission medications   Medication Sig Start Date End Date Taking? Authorizing Provider  Cholecalciferol (VITAMIN D) 2000 UNITS tablet Take 1 tablet (2,000 Units total) by mouth daily. 02/13/14  Yes [name], MD  diltiazem (CARDIZEM CD) 120 MG 24 hr capsule Take 1 capsule (120 mg total) by mouth daily. 03/13/14  Yes Costin Karlyne Greenspan, MD  enoxaparin (LOVENOX) 150 MG/ML injection Inject 0.73 mLs (110 mg total) into the skin daily. Starting tonight 07/23/2014 at 10:00pm Patient taking differently: Inject 110 mg into the skin daily.  08/12/14  Yes Micheline Chapman, NP  folic acid (FOLVITE) 1 MG tablet Take 1 tablet (1 mg total) by mouth every morning. 10/08/13  Yes [name], MD  HYDROmorphone (DILAUDID) 4 MG tablet Take 1  tablet (4 mg total) by mouth every 4 (four) hours as needed for severe pain. 08/08/14  Yes Micheline Chapman, NP  hydroxyurea (HYDREA) 500 MG capsule Take 3 capsules (1,500 mg total) by mouth daily. May take with food to minimize GI side effects. 10/30/13  Yes [name], MD  lisinopril (PRINIVIL,ZESTRIL) 10 MG tablet Take 1 tablet (10 mg total) by mouth daily. 01/22/14  Yes [name], MD  metoprolol tartrate (LOPRESSOR) 25 MG tablet Take 1 tablet (25 mg total) by mouth daily. 08/22/13  Yes [name], MD  morphine (MS CONTIN) 30 MG 12 hr tablet Take 1 tablet (30 mg total) by mouth every 12 (twelve) hours. 08/08/14  Yes Micheline Chapman, NP  potassium chloride SA (K-DUR,KLOR-CON) 20 MEQ tablet Take 1 tablet (20 mEq total) by mouth every morning. 06/09/14  Yes [name], MD  zolpidem (AMBIEN) 10 MG tablet Take 1 tablet (10 mg total) by mouth at bedtime as needed for sleep. 08/08/14  Yes Micheline Chapman, NP  aspirin 81 MG chewable tablet Chew 1 tablet (81 mg total) by mouth daily. 07/23/14   [name], MD   BP 110/73 mmHg  Pulse 83  Temp(Src) 98.7 F (37.1 C) (Oral)  Resp 18  SpO2 93%   Physical Exam  Constitutional: He is oriented to person, place, and time. He appears well-developed and well-nourished.  HENT:  Head: Normocephalic and atraumatic.  Eyes: Conjunctivae and EOM are normal.  Neck: Normal range of motion. Neck supple.  Cardiovascular: Normal rate, regular rhythm and normal heart sounds.   Pulmonary/Chest: Effort normal and breath sounds normal. No respiratory distress.  Abdominal: He exhibits no distension. There is no tenderness. There is no rebound and no guarding.  Musculoskeletal: Normal range of motion.  Neurological: He is alert and oriented to person, place, and time.  Skin: Skin is warm and dry.  Vitals reviewed.   ED Course  Procedures (including critical care time) DIAGNOSTIC STUDIES: Oxygen Saturation is 93% on RA,  adequate by my interpretation.    COORDINATION OF CARE: 2:27 AM-Discussed treatment plan with pt at bedside and pt agreed to plan.     Labs Review Labs Reviewed  COMPREHENSIVE METABOLIC PANEL - Abnormal; Notable for the following:    Glucose, Bld 116 (*)    Creatinine, Ser 0.58 (*)    Calcium 8.7 (*)    Total Bilirubin 6.0 (*)    All other components within normal limits  CBC WITH DIFFERENTIAL/PLATELET - Abnormal; Notable for the following:    WBC 15.5 (*)    RBC 2.42 (*)    Hemoglobin 7.4 (*)  HCT 22.6 (*)    RDW 20.3 (*)    Platelets 415 (*)    Neutro Abs 9.7 (*)    Monocytes Relative 14 (*)    Monocytes Absolute 2.1 (*)    All other components within normal limits    Imaging Review No results found.   EKG Interpretation None      MDM   Final diagnoses:  Sickle cell disease with crisis      34 y.o. male with pertinent PMH of sickle cell anemia presents with recurrent sickle cell pain.  No chest pain, dyspnea, fever, or other concerning findings.  Wu as above, unremarkable.  Pain relieved with 3 rounds dilaudid.  DC home in stable condition.    I have reviewed all laboratory and imaging studies if ordered as above  1. Sickle cell disease with crisis           Debby Freiberg, MD 08/21/14 [phone]

## 2014-08-21 NOTE — Discharge Instructions (Signed)
Sickle Cell Anemia, Adult °Sickle cell anemia is a condition in which red blood cells have an abnormal "sickle" shape. This abnormal shape shortens the cells' life span, which results in a lower than normal concentration of red blood cells in the blood. The sickle shape also causes the cells to clump together and block free blood flow through the blood vessels. As a result, the tissues and organs of the body do not receive enough oxygen. Sickle cell anemia causes organ damage and pain and increases the risk of infection. °CAUSES  °Sickle cell anemia is a genetic disorder. Those who receive two copies of the gene have the condition, and those who receive one copy have the trait. °RISK FACTORS °The sickle cell gene is most common in people whose families originated in [place]. Other areas of the globe where sickle cell trait occurs include the [place], [place] and [place], the [place], and the [place].  °SIGNS AND SYMPTOMS °· Pain, especially in the extremities, back, chest, or abdomen (common). The pain may start suddenly or may develop following an illness, especially if there is dehydration. Pain can also occur due to overexertion or exposure to extreme temperature changes. °· Frequent severe bacterial infections, especially certain types of pneumonia and meningitis. °· Pain and swelling in the hands and feet. °· Decreased activity.   °· Loss of appetite.   °· Change in behavior. °· Headaches. °· Seizures. °· Shortness of breath or difficulty breathing. °· Vision changes. °· Skin ulcers. °Those with the trait may not have symptoms or they may have mild symptoms.  °DIAGNOSIS  °Sickle cell anemia is diagnosed with blood tests that demonstrate the genetic trait. It is often diagnosed during the newborn period, due to mandatory testing nationwide. A variety of blood tests, X-rays, CT scans, MRI scans, ultrasounds, and lung function tests may also be done to monitor the condition. °TREATMENT  °Sickle  cell anemia may be treated with: °· Medicines. You may be given pain medicines, antibiotic medicines (to treat and prevent infections) or medicines to increase the production of certain types of hemoglobin. °· Fluids. °· Oxygen. °· Blood transfusions. °HOME CARE INSTRUCTIONS  °· Drink enough fluid to keep your urine clear or pale yellow. Increase your fluid intake in hot weather and during exercise. °· Do not smoke. Smoking lowers oxygen levels in the blood.   °· Only take over-the-counter or prescription medicines for pain, fever, or discomfort as directed by your health care provider. °· Take antibiotics as directed by your health care provider. Make sure you finish them it even if you start to feel better.   °· Take supplements as directed by your health care provider.   °· Consider wearing a medical alert bracelet. This tells anyone caring for you in an emergency of your condition.   °· When traveling, keep your medical information, health care provider's names, and the medicines you take with you at all times.   °· If you develop a fever, do not take medicines to reduce the fever right away. This could cover up a problem that is developing. Notify your health care provider. °· Keep all follow-up appointments with your health care provider. Sickle cell anemia requires regular medical care. °SEEK MEDICAL CARE IF: ° You have a fever. °SEEK IMMEDIATE MEDICAL CARE IF:  °· You feel dizzy or faint.   °· You have new abdominal pain, especially on the left side near the stomach area.   °· You develop a persistent, often uncomfortable and painful penile erection (priapism). If this is not treated immediately it   will lead to impotence.   °· You have numbness your arms or legs or you have a hard time moving them.   °· You have a hard time with speech.   °· You have a fever or persistent symptoms for more than 2-3 days.   °· You have a fever and your symptoms suddenly get worse.   °· You have signs or symptoms of infection.  These include:   °¨ Chills.   °¨ Abnormal tiredness (lethargy).   °¨ Irritability.   °¨ Poor eating.   °¨ Vomiting.   °· You develop pain that is not helped with medicine.   °· You develop shortness of breath. °· You have pain in your chest.   °· You are coughing up pus-like or bloody sputum.   °· You develop a stiff neck. °· Your feet or hands swell or have pain. °· Your abdomen appears bloated. °· You develop joint pain. °MAKE SURE YOU: °· Understand these instructions. °Document Released: 04/27/2005 Document Revised: 06/03/2013 Document Reviewed: 08/29/2012 °[employer]® Patient Information ©2015 [employer]. This information is not intended to replace advice given to you by your health care provider. Make sure you discuss any questions you have with your health care provider. ° °

## 2014-08-24 ENCOUNTER — Encounter (HOSPITAL_COMMUNITY): Payer: Self-pay | Admitting: Emergency Medicine

## 2014-08-24 ENCOUNTER — Emergency Department (HOSPITAL_COMMUNITY): Payer: Medicare Other

## 2014-08-24 ENCOUNTER — Inpatient Hospital Stay (HOSPITAL_BASED_OUTPATIENT_CLINIC_OR_DEPARTMENT_OTHER): Payer: Medicare Other

## 2014-08-24 ENCOUNTER — Inpatient Hospital Stay (HOSPITAL_COMMUNITY)
Admission: EM | Admit: 2014-08-24 | Discharge: 2014-08-27 | DRG: 812 | Disposition: A | Discharge status: Home / Self Care | Payer: Medicare Other | Attending: Internal Medicine | Admitting: Internal Medicine

## 2014-08-24 DIAGNOSIS — J9611 Chronic respiratory failure with hypoxia: Secondary | ICD-10-CM | POA: Diagnosis present

## 2014-08-24 DIAGNOSIS — M7989 Other specified soft tissue disorders: Secondary | ICD-10-CM

## 2014-08-24 DIAGNOSIS — D5700 Hb-SS disease with crisis, unspecified: Secondary | ICD-10-CM | POA: Insufficient documentation

## 2014-08-24 DIAGNOSIS — I10 Essential (primary) hypertension: Secondary | ICD-10-CM | POA: Diagnosis present

## 2014-08-24 DIAGNOSIS — I2781 Cor pulmonale (chronic): Secondary | ICD-10-CM | POA: Diagnosis present

## 2014-08-24 DIAGNOSIS — D638 Anemia in other chronic diseases classified elsewhere: Secondary | ICD-10-CM | POA: Diagnosis present

## 2014-08-24 DIAGNOSIS — R600 Localized edema: Secondary | ICD-10-CM | POA: Diagnosis present

## 2014-08-24 DIAGNOSIS — I272 Pulmonary hypertension, unspecified: Secondary | ICD-10-CM | POA: Diagnosis present

## 2014-08-24 DIAGNOSIS — Z7901 Long term (current) use of anticoagulants: Secondary | ICD-10-CM

## 2014-08-24 DIAGNOSIS — M25473 Effusion, unspecified ankle: Secondary | ICD-10-CM | POA: Diagnosis present

## 2014-08-24 DIAGNOSIS — Z86711 Personal history of pulmonary embolism: Secondary | ICD-10-CM | POA: Diagnosis present

## 2014-08-24 LAB — CBC WITH DIFFERENTIAL/PLATELET
BASOS ABS: 0 10*3/uL (ref 0.0–0.1)
Basophils Relative: 0 % (ref 0–1)
Eosinophils Absolute: 0 10*3/uL (ref 0.0–0.7)
Eosinophils Relative: 0 % (ref 0–5)
HCT: 20.2 % — ABNORMAL LOW (ref 39.0–52.0)
Hemoglobin: 6.8 g/dL — CL (ref 13.0–17.0)
LYMPHS PCT: 9 % — AB (ref 12–46)
Lymphs Abs: 2 10*3/uL (ref 0.7–4.0)
MCH: 30.9 pg (ref 26.0–34.0)
MCHC: 33.7 g/dL (ref 30.0–36.0)
MCV: 91.8 fL (ref 78.0–100.0)
MONOS PCT: 12 % (ref 3–12)
Monocytes Absolute: 2.7 10*3/uL — ABNORMAL HIGH (ref 0.1–1.0)
NEUTROS ABS: 17.6 10*3/uL — AB (ref 1.7–7.7)
NEUTROS PCT: 79 % — AB (ref 43–77)
PLATELETS: 403 10*3/uL — AB (ref 150–400)
RBC: 2.2 MIL/uL — ABNORMAL LOW (ref 4.22–5.81)
RDW: 19.6 % — AB (ref 11.5–15.5)
WBC: 22.3 10*3/uL — AB (ref 4.0–10.5)

## 2014-08-24 LAB — BASIC METABOLIC PANEL
Anion gap: 7 (ref 5–15)
BUN: 18 mg/dL (ref 6–20)
CALCIUM: 8.7 mg/dL — AB (ref 8.9–10.3)
CO2: 21 mmol/L — ABNORMAL LOW (ref 22–32)
Chloride: 108 mmol/L (ref 101–111)
Creatinine, Ser: 0.66 mg/dL (ref 0.61–1.24)
Glucose, Bld: 118 mg/dL — ABNORMAL HIGH (ref 65–99)
Potassium: 4 mmol/L (ref 3.5–5.1)
SODIUM: 136 mmol/L (ref 135–145)

## 2014-08-24 LAB — RETICULOCYTES
RBC.: 2.2 MIL/uL — AB (ref 4.22–5.81)
RETIC COUNT ABSOLUTE: 259.6 10*3/uL — AB (ref 19.0–186.0)
RETIC CT PCT: 11.8 % — AB (ref 0.4–3.1)

## 2014-08-24 LAB — AMMONIA: Ammonia: 48 umol/L — ABNORMAL HIGH (ref 9–35)

## 2014-08-24 MED ORDER — DIPHENHYDRAMINE HCL 25 MG PO CAPS
50.0000 mg | ORAL_CAPSULE | Freq: Once | ORAL | Status: AC
  Administered 2014-08-24: 50 mg via ORAL
  Filled 2014-08-24: qty 2

## 2014-08-24 MED ORDER — ONDANSETRON HCL 4 MG/2ML IJ SOLN: 4.0000 mg | Freq: Four times a day (QID) | INTRAMUSCULAR | Status: DC | PRN

## 2014-08-24 MED ORDER — LISINOPRIL 10 MG PO TABS
10.0000 mg | ORAL_TABLET | Freq: Every day | ORAL | Status: DC
  Administered 2014-08-25 – 2014-08-27 (×3): 10 mg via ORAL
  Filled 2014-08-24 (×3): qty 1

## 2014-08-24 MED ORDER — KETOROLAC TROMETHAMINE 30 MG/ML IJ SOLN
30.0000 mg | Freq: Four times a day (QID) | INTRAMUSCULAR | Status: DC
  Administered 2014-08-24 – 2014-08-27 (×13): 30 mg via INTRAVENOUS
  Filled 2014-08-24 (×16): qty 1

## 2014-08-24 MED ORDER — ENOXAPARIN SODIUM 120 MG/0.8ML ~~LOC~~ SOLN
110.0000 mg | SUBCUTANEOUS | Status: DC
  Administered 2014-08-25 – 2014-08-27 (×3): 110 mg via SUBCUTANEOUS
  Filled 2014-08-24 (×3): qty 0.8

## 2014-08-24 MED ORDER — NALOXONE HCL 0.4 MG/ML IJ SOLN: 0.4000 mg | INTRAMUSCULAR | Status: DC | PRN

## 2014-08-24 MED ORDER — KETOROLAC TROMETHAMINE 15 MG/ML IJ SOLN
15.0000 mg | Freq: Once | INTRAMUSCULAR | Status: AC
  Administered 2014-08-24: 15 mg via INTRAVENOUS
  Filled 2014-08-24: qty 1

## 2014-08-24 MED ORDER — FOLIC ACID 1 MG PO TABS
1.0000 mg | ORAL_TABLET | Freq: Every morning | ORAL | Status: DC
  Administered 2014-08-25 – 2014-08-27 (×3): 1 mg via ORAL
  Filled 2014-08-24 (×3): qty 1

## 2014-08-24 MED ORDER — HYDROMORPHONE HCL 2 MG/ML IJ SOLN
2.0000 mg | Freq: Once | INTRAMUSCULAR | Status: AC
  Administered 2014-08-24: 2 mg via INTRAVENOUS
  Filled 2014-08-24: qty 1

## 2014-08-24 MED ORDER — METOPROLOL TARTRATE 25 MG PO TABS
25.0000 mg | ORAL_TABLET | Freq: Every day | ORAL | Status: DC
  Administered 2014-08-25 – 2014-08-27 (×3): 25 mg via ORAL
  Filled 2014-08-24 (×3): qty 1

## 2014-08-24 MED ORDER — HYDROMORPHONE HCL 2 MG/ML IJ SOLN: 2.0000 mg | INTRAMUSCULAR | Status: DC | PRN

## 2014-08-24 MED ORDER — ASPIRIN 81 MG PO CHEW
81.0000 mg | CHEWABLE_TABLET | Freq: Every day | ORAL | Status: DC
  Administered 2014-08-25 – 2014-08-27 (×3): 81 mg via ORAL
  Filled 2014-08-24 (×3): qty 1

## 2014-08-24 MED ORDER — SODIUM CHLORIDE 0.9 % IV SOLN: INTRAVENOUS | Status: DC

## 2014-08-24 MED ORDER — DEXTROSE-NACL 5-0.45 % IV SOLN
INTRAVENOUS | Status: DC
  Administered 2014-08-24: 15:00:00 via INTRAVENOUS

## 2014-08-24 MED ORDER — SODIUM CHLORIDE 0.9 % IJ SOLN: 9.0000 mL | INTRAMUSCULAR | Status: DC | PRN

## 2014-08-24 MED ORDER — MORPHINE SULFATE ER 30 MG PO TBCR
30.0000 mg | EXTENDED_RELEASE_TABLET | Freq: Two times a day (BID) | ORAL | Status: DC
  Administered 2014-08-24 – 2014-08-27 (×6): 30 mg via ORAL
  Filled 2014-08-24 (×6): qty 1

## 2014-08-24 MED ORDER — DILTIAZEM HCL ER COATED BEADS 120 MG PO CP24
120.0000 mg | ORAL_CAPSULE | Freq: Every day | ORAL | Status: DC
  Administered 2014-08-25 – 2014-08-27 (×3): 120 mg via ORAL
  Filled 2014-08-24 (×3): qty 1

## 2014-08-24 MED ORDER — SODIUM CHLORIDE 0.9 % IV BOLUS (SEPSIS)
500.0000 mL | Freq: Once | INTRAVENOUS | Status: AC
  Administered 2014-08-24: 500 mL via INTRAVENOUS

## 2014-08-24 MED ORDER — DIPHENHYDRAMINE HCL 25 MG PO CAPS
25.0000 mg | ORAL_CAPSULE | Freq: Four times a day (QID) | ORAL | Status: DC | PRN
  Administered 2014-08-24: 25 mg via ORAL
  Administered 2014-08-25: 50 mg via ORAL
  Filled 2014-08-24: qty 2
  Filled 2014-08-24: qty 1

## 2014-08-24 MED ORDER — HYDROXYUREA 500 MG PO CAPS
1500.0000 mg | ORAL_CAPSULE | Freq: Every day | ORAL | Status: DC
  Administered 2014-08-25 – 2014-08-27 (×3): 1500 mg via ORAL
  Filled 2014-08-24 (×3): qty 3

## 2014-08-24 MED ORDER — ZOLPIDEM TARTRATE 10 MG PO TABS
10.0000 mg | ORAL_TABLET | Freq: Every evening | ORAL | Status: DC | PRN
  Administered 2014-08-24 – 2014-08-26 (×2): 10 mg via ORAL
  Filled 2014-08-24 (×2): qty 1

## 2014-08-24 MED ORDER — HYDROMORPHONE HCL 1 MG/ML IJ SOLN
1.0000 mg | Freq: Once | INTRAMUSCULAR | Status: AC
  Administered 2014-08-24: 1 mg via INTRAVENOUS
  Filled 2014-08-24: qty 1

## 2014-08-24 MED ORDER — POTASSIUM CHLORIDE CRYS ER 20 MEQ PO TBCR
20.0000 meq | EXTENDED_RELEASE_TABLET | Freq: Every morning | ORAL | Status: DC
  Administered 2014-08-25 – 2014-08-27 (×3): 20 meq via ORAL
  Filled 2014-08-24 (×3): qty 1

## 2014-08-24 MED ORDER — HYDROMORPHONE 2 MG/ML HIGH CONCENTRATION IV PCA SOLN
INTRAVENOUS | Status: DC
  Administered 2014-08-24: 2.1 mg via INTRAVENOUS
  Administered 2014-08-24: 12:00:00 via INTRAVENOUS
  Administered 2014-08-24: 4.9 mg via INTRAVENOUS
  Administered 2014-08-25: 7 mg via INTRAVENOUS
  Administered 2014-08-25: 2.8 mg via INTRAVENOUS
  Administered 2014-08-25: 4.2 mg via INTRAVENOUS
  Administered 2014-08-25: 22:00:00 via INTRAVENOUS
  Administered 2014-08-25: 4.5 mg via INTRAVENOUS
  Administered 2014-08-25: 10.5 mg via INTRAVENOUS
  Administered 2014-08-25: 4.2 mg via INTRAVENOUS
  Administered 2014-08-26: 16.1 mg via INTRAVENOUS
  Administered 2014-08-26: 11.2 mg via INTRAVENOUS
  Administered 2014-08-26: 7 mg via INTRAVENOUS
  Administered 2014-08-26: 10.5 mg via INTRAVENOUS
  Administered 2014-08-26: 0.7 mg via INTRAVENOUS
  Administered 2014-08-27: 5.6 mg via INTRAVENOUS
  Administered 2014-08-27: 3.5 mg via INTRAVENOUS
  Administered 2014-08-27: 10:00:00 via INTRAVENOUS
  Administered 2014-08-27: 10.5 mg via INTRAVENOUS
  Filled 2014-08-24 (×3): qty 25

## 2014-08-24 MED ORDER — VITAMIN D3 25 MCG (1000 UNIT) PO TABS
2000.0000 [IU] | ORAL_TABLET | Freq: Every day | ORAL | Status: DC
  Administered 2014-08-25 – 2014-08-27 (×3): 2000 [IU] via ORAL
  Filled 2014-08-24 (×3): qty 2

## 2014-08-24 NOTE — Progress Notes (Signed)
Pt. Arrived to floor from emergency room. Pt. Alert and oriented x 4. Appears frigidity, impulsive, somewhat sleepy, and some irritablity. Responds to questions appropriately and cooperative. Continue with admission assessment. Dr. Ree Kida via page of admission to room and [address] floor.

## 2014-08-24 NOTE — Progress Notes (Signed)
VASCULAR LAB PRELIMINARY  PRELIMINARY  PRELIMINARY  PRELIMINARY  Left lower extremity venous Doppler completed.    Preliminary report:  There is no DVT or SVT noted in the left lower extremity.   [name], RVT 08/24/2014, 10:32 AM

## 2014-08-24 NOTE — ED Notes (Signed)
Doppler called and wants to perform doppler in ED. Was advised to come to ED since floor was unable to take report

## 2014-08-24 NOTE — ED Notes (Signed)
Attempted to call report to floor. Will call when she is out of contact room

## 2014-08-24 NOTE — ED Notes (Addendum)
Pt from home c/o left leg swelling, left foot swelling, and leg pain that started yesterday. Hx of sickle cell.  Denies injury. Denies shortness of breath SPO2 90 % on RA in triage.

## 2014-08-24 NOTE — H&P (Signed)
[place] History and Physical  [name] OIB:[id number] DOB: [birth year] DOA: 08/24/2014   PCP: [name],[name], MD   Chief Complaint: pain  HPI: [name] is a 34yo male with sickle cell disease who presented with complaints of leg pain that started about 2 days. He reports that he began have pain in his left foot and the following morning he found it to be remarkably swollen. Nothing seemed to help his pain. He tried his home pain meds with no relief. He also complains of bilateral rib pain. He denies any cough or SOB. He is intermittently using his home oxygen at 3 L. He vomited once yesterday. Emesis was yellow/brown in color. No emesis since then. Denies diarrhea, last bowel movement was 2 days ago. He states that he has been taking his Lovenox and can not recall missing any doses.   In the ED, he was hypoxic down to 90% o2 sat. He was placed on 4LPM via nasal cannula. A doppler for his leg was ordered but not yet performed.  Review of Systems:  Constitutional: No weight loss, night sweats, fatigue.  HEENT: No headaches seizures, vision changes, difficulty swallowing,Tooth/dental problems,Sore throat, No sneezing, itching, ear ache, nasal congestion, post nasal drip,  Cardio-vascular: No chest pain, No orthopnea, PND, swelling in lower extremities, anasarca, dizziness, palpitations  GI: No heartburn, indigestion, abdominal pain. No nausea or diarrhea. +vomiting. No change in bowel habits or loss of appetite  Resp: No shortness of breath at rest. No excess mucus, no productive cough. No coughing up of blood.No change in color of mucus.No wheezing.No chest wall deformity  Skin: no new lesions or rash GU: no dysuria, change in color of urine, no urgency or frequency. No flank pain.  Musculoskeletal: No joint pain or swelling. No decreased range of motion. + rib pain and limb pain.  Psych: No change in mood or affect. No depression or anxiety. No memory  loss.    Past Medical History  Diagnosis Date  . Sickle cell anemia   . Blood transfusion   . Acute embolism and thrombosis of right internal jugular vein   . Hypokalemia   . Mood disorder   . History of pulmonary embolus (PE)   . Avascular necrosis   . Leukocytosis     Chronic  . Thrombocytosis     Chronic  . Hypertension   . History of Clostridium difficile infection   . Uses marijuana   . Chronic anticoagulation   . Functional asplenia   . Former smoker   . Second hand tobacco smoke exposure   . Alcohol consumption of one to four drinks per day   . Noncompliance with medication regimen   . Sickle-cell crisis with associated acute chest syndrome 05/13/2013  . Acute chest syndrome 06/18/2013  . Demand ischemia 01/02/2014   Past Surgical History  Procedure Laterality Date  . Right hip replacement      08/2006  . Cholecystectomy      01/2008  . Porta cath placement    . Porta cath removal    . Umbilical hernia repair      01/2008  . Excision of left periauricular cyst      10/2009  . Excision of right ear lobe cyst with primary closur      11/2007  . Portacath placement  01/05/2012    Procedure: INSERTION PORT-A-CATH;  Surgeon: Odis Hollingshead, MD;  Location: [place];  Service: General;  Laterality: N/A;  ultrasound  guiced port a cath insertion with fluoroscopy   Social History:  reports that he quit smoking about 4 years ago. He has never used smokeless tobacco. He reports that he uses illicit drugs (Marijuana) about twice per week. He reports that he does not drink alcohol.  No Known Allergies  Family History  Problem Relation Age of Onset  . Sickle cell trait Mother   . Depression Mother   . Diabetes Mother   . Sickle cell trait Father   . Sickle cell trait Brother    No current facility-administered medications on file prior to encounter.   Current Outpatient Prescriptions on File Prior to Encounter  Medication Sig Dispense Refill  . Cholecalciferol (VITAMIN  D) 2000 UNITS tablet Take 1 tablet (2,000 Units total) by mouth daily. 90 tablet 3  . diltiazem (CARDIZEM CD) 120 MG 24 hr capsule Take 1 capsule (120 mg total) by mouth daily. 30 capsule 1  . enoxaparin (LOVENOX) 150 MG/ML injection Inject 0.73 mLs (110 mg total) into the skin daily. Starting tonight 07/23/2014 at 10:00pm (Patient taking differently: Inject 110 mg into the skin daily. ) 22 Syringe 5  . folic acid (FOLVITE) 1 MG tablet Take 1 tablet (1 mg total) by mouth every morning. 30 tablet 11  . HYDROmorphone (DILAUDID) 4 MG tablet Take 1 tablet (4 mg total) by mouth every 4 (four) hours as needed for severe pain. 90 tablet 0  . hydroxyurea (HYDREA) 500 MG capsule Take 3 capsules (1,500 mg total) by mouth daily. May take with food to minimize GI side effects. 60 capsule 3  . lisinopril (PRINIVIL,ZESTRIL) 10 MG tablet Take 1 tablet (10 mg total) by mouth daily. 30 tablet 1  . metoprolol tartrate (LOPRESSOR) 25 MG tablet Take 1 tablet (25 mg total) by mouth daily. 30 tablet 6  . morphine (MS CONTIN) 30 MG 12 hr tablet Take 1 tablet (30 mg total) by mouth every 12 (twelve) hours. 60 tablet 0  . potassium chloride SA (K-DUR,KLOR-CON) 20 MEQ tablet Take 1 tablet (20 mEq total) by mouth every morning. 30 tablet 3  . zolpidem (AMBIEN) 10 MG tablet Take 1 tablet (10 mg total) by mouth at bedtime as needed for sleep. 30 tablet 0  . aspirin 81 MG chewable tablet Chew 1 tablet (81 mg total) by mouth daily. 30 tablet 11    Physical Exam: Filed Vitals:   08/24/14 1600 08/24/14 1612 08/24/14 1959 08/24/14 2034  BP:    133/80  Pulse:    102  Temp:    98.5 F (36.9 C)  TempSrc:    Oral  Resp: 19 16 22 22   Height:      Weight:      SpO2: 96% 97% 96% 92%   General: Alert, awake, oriented x3, mild distress and discomfort.  HEENT: [place]/AT PEERL, EOMI, 1+scleral icterus Neck: Trachea midline,  no masses, no thyromegaly no JVD, no carotid bruit, no lymphadenopathy OROPHARYNX:  Moist, No exudate/  erythema/lesions.  Heart: tachycardic and regular rhythm, without audible murmur Lungs: Clear to auscultation, no wheezing or rhonchi noted. Abdomen: Soft, nontender, nondistended, positive bowel sounds, no masses no hepatosplenomegaly noted. Neuro: No focal neurological deficits noted cranial nerves II through XII grossly intact. MSK: no warmth, swelling, or erythema Extremities: mild clubbing of fingers, no cyanosis. Has edema of both feet up to ankles with 1+ pitting bilaterally. No warmth or redness to area.  Labs on Admission:  Basic Metabolic Panel:  Recent Labs Lab 08/18/14 0950 08/21/14 0213 08/24/14  0739  NA 137 140 136  K 3.7 3.8 4.0  CL 110 110 108  CO2 23 25 21*  GLUCOSE 95 116* 118*  BUN 6 7 18   CREATININE 0.61 0.58* 0.66  CALCIUM 8.5* 8.7* 8.7*   Liver Function Tests:  Recent Labs Lab 08/18/14 0950 08/21/14 0213  AST 30 37  ALT 26 26  ALKPHOS 79 80  BILITOT 4.3* 6.0*  PROT 7.4 7.6  ALBUMIN 4.1 4.1   CBC:  Recent Labs Lab 08/18/14 0950 08/21/14 0213 08/24/14 0739  WBC 15.8* 15.5* 22.3*  NEUTROABS 10.7* 9.7* 17.6*  HGB 7.9* 7.4* 6.8*  HCT 24.1* 22.6* 20.2*  MCV 94.9 93.4 91.8  PLT 512* 415* 403*   BNP: Invalid input(s): POCBNP   Radiological Exams on Admission: Dg Chest 2 View  08/24/2014   CLINICAL DATA:  Sickle cell crisis, shortness of breath  EXAM: CHEST  2 VIEW  COMPARISON:  08/05/2014  FINDINGS: Moderate enlargement of the cardiac silhouette is reidentified. Diffusely prominent interstitial markings are stable with areas of linear probable superimposed scarring. No pleural effusion. Cholecystectomy clips are noted. No acute osseous finding. Left-sided Port-A-Cath in place with tip at the cavoatrial junction.  IMPRESSION: No focal acute finding.   Electronically Signed   By: Conchita Paris M.D.   On: 08/24/2014 08:41   Dg Ankle Complete Left  08/24/2014   CLINICAL DATA:  Sickle cell crisis with left lower leg and ankle pain and swelling.   EXAM: LEFT ANKLE COMPLETE - 3+ VIEW  COMPARISON:  12/21/2006  FINDINGS: Diffuse soft tissue swelling is seen surrounding the left ankle. No evidence of fracture, dislocation or bony destruction. The ankle mortise shows normal alignment. No bony lesions or visible bony infarcts by x-ray. No significant degenerative changes.  IMPRESSION: Soft tissue swelling surrounding left ankle. No underlying bony abnormalities are identified.   Electronically Signed   By: Aletta Edouard M.D.   On: 08/24/2014 08:35     Assessment/Plan: Active Problems:   Pulmonary HTN   Hx of pulmonary embolus   Chronic anticoagulation   Essential hypertension   Pulmonary embolus   Hb-SS disease with crisis   Hypoxia   Ankle edema   Sickle cell pain crisis    Sickle Cell Anemia with crisis: Hgb SS/opioid tolerant. Pt presents with pain consistent with VOC. Start Dilaudid PCA 0.7mg  q10 min and Toradol 30mg  q6h. Continue MS contin for long acting and PO Benadryl for itch.  Anemia: Hgb of 6.8, close to his baseline of 7. Continue to monitor. Sickle Cell Care: Continue Folic Acid and Hydroxyurea  H/o PE: Continue Lovenox 110mg  daily. Pt has yet to follow-up in anticoagulation clinic. HTN: Well controlled. Continue home Lisinopril, Diltiazem, and Metoprolol Leukocytosis: Likely reflective of acute crisis. Pt does not show any signs of infection. Continue to monitor. PulmHTN/hypoxia: Pt with intermittent use of home O2. Has been referred to cardiology several times for pulmonary HTN but has no showed to all appointments made. Continue Supplemental O2 as needed to keep O2 sats above 95%. Lower Extremity swelling: Mainly localized to feet and ankles. May actually be dactylitis reflective of acute crisis. However, there is some pitting suggesting other etiology. His TTE from 07/23/14 showed an LVEF of 55-60%. Dopplers of left leg is pending, but now right leg is equally swollen. Will f/u dopplers and order a new TTE to evaluate EF  or diastolic dysfunction that may be leading to his pedal edema.  9. FEN/GI :D5/0.45% @100cc /hr given for 12 . Continue  KDur for h/o hypokalemia and vitD for his vitD deficiency. General diet  Code Status: Full code DVT Prophylaxis: on Lovenox Family Communication: none Disposition Plan: pending improvement Time spent: >75 min  Kalman Shan, MD  Pager [phone]  If 7PM-7AM, please contact night-coverage [email or web address] Password [facility] 08/24/2014, 8:39 PM

## 2014-08-24 NOTE — ED Provider Notes (Signed)
CSN: [id number]     Arrival date & time 08/24/14  3825 History   First MD Initiated Contact with Patient 08/24/14 [phone]     Chief Complaint  Patient presents with  . Sickle Cell Pain Crisis  . Leg Swelling     (Consider location/radiation/quality/duration/timing/severity/associated sxs/prior Treatment) HPI Comments: 34 year old male with history of pulmonary embolism, sickle cell anemia, chronic anticoagulation has been on Xarelto and currently is on Lovenox, marijuana use, pain crises presents with left lower extremity swelling and pain as well as rib pain. Patient has had these in the past this sickle cell however the left ankle foot swelling is new. Patient has known blood clots in the lungs denies blood clot in that leg. Patient has been taking his Lovenox regularly has not missed any doses. Patient does not have a filter in place. No fevers and chills. Patient has intermittent shortness of breath which is similar to previous, uses oxygen as needed at home. Pain worse with palpation intermittent ache. No history of joint diseases.  Patient is a 34 y.o. male presenting with sickle cell pain. The history is provided by the patient.  Sickle Cell Pain Crisis Associated symptoms: no chest pain, no congestion, no fever, no headaches, no shortness of breath and no vomiting     Past Medical History  Diagnosis Date  . Sickle cell anemia   . Blood transfusion   . Acute embolism and thrombosis of right internal jugular vein   . Hypokalemia   . Mood disorder   . History of pulmonary embolus (PE)   . Avascular necrosis   . Leukocytosis     Chronic  . Thrombocytosis     Chronic  . Hypertension   . History of Clostridium difficile infection   . Uses marijuana   . Chronic anticoagulation   . Functional asplenia   . Former smoker   . Second hand tobacco smoke exposure   . Alcohol consumption of one to four drinks per day   . Noncompliance with medication regimen   . Sickle-cell crisis with  associated acute chest syndrome 05/13/2013  . Acute chest syndrome 06/18/2013  . Demand ischemia 01/02/2014   Past Surgical History  Procedure Laterality Date  . Right hip replacement      08/2006  . Cholecystectomy      01/2008  . Porta cath placement    . Porta cath removal    . Umbilical hernia repair      01/2008  . Excision of left periauricular cyst      10/2009  . Excision of right ear lobe cyst with primary closur      11/2007  . Portacath placement  01/05/2012    Procedure: INSERTION PORT-A-CATH;  Surgeon: Odis Hollingshead, MD;  Location: [place];  Service: General;  Laterality: N/A;  ultrasound guiced port a cath insertion with fluoroscopy   Family History  Problem Relation Age of Onset  . Sickle cell trait Mother   . Depression Mother   . Diabetes Mother   . Sickle cell trait Father   . Sickle cell trait Brother    History  Substance Use Topics  . Smoking status: Former Smoker -- 13 years    Quit date: 07/08/2010  . Smokeless tobacco: Never Used  . Alcohol Use: No    Review of Systems  Constitutional: Negative for fever and chills.  HENT: Negative for congestion.   Eyes: Negative for visual disturbance.  Respiratory: Negative for shortness of breath.   Cardiovascular:  Positive for leg swelling. Negative for chest pain.  Gastrointestinal: Negative for vomiting and abdominal pain.  Genitourinary: Negative for dysuria and flank pain.  Musculoskeletal: Positive for arthralgias. Negative for back pain, neck pain and neck stiffness.  Skin: Negative for rash.  Neurological: Negative for light-headedness and headaches.      Allergies  Review of patient's allergies indicates no known allergies.  Home Medications   Prior to Admission medications   Medication Sig Start Date End Date Taking? Authorizing Provider  Cholecalciferol (VITAMIN D) 2000 UNITS tablet Take 1 tablet (2,000 Units total) by mouth daily. 02/13/14  Yes [name], MD  diltiazem (CARDIZEM  CD) 120 MG 24 hr capsule Take 1 capsule (120 mg total) by mouth daily. 03/13/14  Yes Costin Karlyne Greenspan, MD  enoxaparin (LOVENOX) 150 MG/ML injection Inject 0.73 mLs (110 mg total) into the skin daily. Starting tonight 07/23/2014 at 10:00pm Patient taking differently: Inject 110 mg into the skin daily.  08/12/14  Yes Micheline Chapman, NP  folic acid (FOLVITE) 1 MG tablet Take 1 tablet (1 mg total) by mouth every morning. 10/08/13  Yes [name], MD  HYDROmorphone (DILAUDID) 4 MG tablet Take 1 tablet (4 mg total) by mouth every 4 (four) hours as needed for severe pain. 08/08/14  Yes Micheline Chapman, NP  hydroxyurea (HYDREA) 500 MG capsule Take 3 capsules (1,500 mg total) by mouth daily. May take with food to minimize GI side effects. 10/30/13  Yes [name], MD  lisinopril (PRINIVIL,ZESTRIL) 10 MG tablet Take 1 tablet (10 mg total) by mouth daily. 01/22/14  Yes [name], MD  metoprolol tartrate (LOPRESSOR) 25 MG tablet Take 1 tablet (25 mg total) by mouth daily. 08/22/13  Yes [name], MD  morphine (MS CONTIN) 30 MG 12 hr tablet Take 1 tablet (30 mg total) by mouth every 12 (twelve) hours. 08/08/14  Yes Micheline Chapman, NP  potassium chloride SA (K-DUR,KLOR-CON) 20 MEQ tablet Take 1 tablet (20 mEq total) by mouth every morning. 06/09/14  Yes [name], MD  zolpidem (AMBIEN) 10 MG tablet Take 1 tablet (10 mg total) by mouth at bedtime as needed for sleep. 08/08/14  Yes Micheline Chapman, NP  aspirin 81 MG chewable tablet Chew 1 tablet (81 mg total) by mouth daily. 07/23/14   [name], MD   BP 117/60 mmHg  Pulse 102  Temp(Src) 98.9 F (37.2 C) (Oral)  Resp 16  SpO2 90% Physical Exam  Constitutional: He is oriented to person, place, and time. He appears well-developed and well-nourished.  HENT:  Head: Normocephalic and atraumatic.  Eyes: Right eye exhibits no discharge. Left eye exhibits no discharge.  Neck: Normal range of motion. Neck supple. No  tracheal deviation present.  Cardiovascular: Regular rhythm.  Tachycardia present.   Pulmonary/Chest: Effort normal and breath sounds normal.  Abdominal: Soft. He exhibits no distension. There is no tenderness. There is no guarding.  Musculoskeletal: He exhibits edema (mild swelling and tenderness left lower extremity worse in the ankle and mild proximal foot on the left, no induration, no erythema, full range of motion with discomfort at the ankle) and tenderness.  Neurological: He is alert and oriented to person, place, and time. No cranial nerve deficit.  Skin: Skin is warm. No rash noted.  Psychiatric: He has a normal mood and affect.  Nursing note and vitals reviewed.   ED Course  Procedures (including critical care time) EMERGENCY DEPARTMENT US SOFT TISSUE INTERPRETATION "Study:  Limited Soft Tissue Ultrasound"  INDICATIONS: Pain Multiple views of the body part were obtained in real-time with a multi-frequency linear probe PERFORMED BY:  Myself IMAGES ARCHIVED?: Yes SIDE:Left BODY PART:Lower extremity FINDINGS: Other left ankle anterior joint, no significant effusion    CPT: Neck 76536-26  Upper extremity 76880-26  Axilla 29924-26  Chest wall 83419-62  Beast 22979-89  Upper back 21194-17  Lower back 40814-48  Abdominal wall 18563-14  Pelvic wall 97026-37  Lower extremity 85885-02  Other soft tissue 77412-87  Labs Review Labs Reviewed  BASIC METABOLIC PANEL - Abnormal; Notable for the following:    CO2 21 (*)    Glucose, Bld 118 (*)    Calcium 8.7 (*)    All other components within normal limits  CBC WITH DIFFERENTIAL/PLATELET - Abnormal; Notable for the following:    WBC 22.3 (*)    RBC 2.20 (*)    Hemoglobin 6.8 (*)    HCT 20.2 (*)    RDW 19.6 (*)    Platelets 403 (*)    Neutrophils Relative % 79 (*)    Lymphocytes Relative 9 (*)    Neutro Abs 17.6 (*)    Monocytes Absolute 2.7 (*)    All other components within normal limits  RETICULOCYTES -  Abnormal; Notable for the following:    Retic Ct Pct 11.8 (*)    RBC. 2.20 (*)    Retic Count, Manual 259.6 (*)    All other components within normal limits    Imaging Review Dg Chest 2 View  08/24/2014   CLINICAL DATA:  Sickle cell crisis, shortness of breath  EXAM: CHEST  2 VIEW  COMPARISON:  08/05/2014  FINDINGS: Moderate enlargement of the cardiac silhouette is reidentified. Diffusely prominent interstitial markings are stable with areas of linear probable superimposed scarring. No pleural effusion. Cholecystectomy clips are noted. No acute osseous finding. Left-sided Port-A-Cath in place with tip at the cavoatrial junction.  IMPRESSION: No focal acute finding.   Electronically Signed   By: Conchita Paris M.D.   On: 08/24/2014 08:41   Dg Ankle Complete Left  08/24/2014   CLINICAL DATA:  Sickle cell crisis with left lower leg and ankle pain and swelling.  EXAM: LEFT ANKLE COMPLETE - 3+ VIEW  COMPARISON:  12/21/2006  FINDINGS: Diffuse soft tissue swelling is seen surrounding the left ankle. No evidence of fracture, dislocation or bony destruction. The ankle mortise shows normal alignment. No bony lesions or visible bony infarcts by x-ray. No significant degenerative changes.  IMPRESSION: Soft tissue swelling surrounding left ankle. No underlying bony abnormalities are identified.   Electronically Signed   By: Aletta Edouard M.D.   On: 08/24/2014 08:35     EKG Interpretation   Date/Time:  Sunday August 24 2014 07:11:29 EDT Ventricular Rate:  92 PR Interval:  160 QRS Duration: 114 QT Interval:  393 QTC Calculation: 486 R Axis:   -88 Text Interpretation:  Sinus rhythm Left atrial enlargement Borderline IVCD  with LAD Consider RVH w/ secondary repol abnormality similar to previous  Confirmed by Abbegale Stehle  MD, Shahrukh Pasch ([id number]) on 08/24/2014 7:54:37 AM      MDM   Final diagnoses:  Sickle cell disease with crisis  Left leg swelling  Anemia, sickle cell with crisis   Patient is known blood  clot history sickle cell anemia on Lovenox presents with left lower extremity pain swelling worsening of the ankle. Very low concern for septic joint at this time likely related to sickle cell pain crises. Plan for  blood work, pain meds, IV fluids, x-rays and ultrasound. Pt had lovenox shot this am.  On reassessment patient still having 08-07-08 pain in the left lower leg and ankle, no chest pain or shortness of breath.  Discussed with sickle cell team for admission and further evaluation, ultrasound of left lower leg pending.  The patients results and plan were reviewed and discussed.   Any x-rays performed were independently reviewed by myself.   Differential diagnosis were considered with the presenting HPI.  Medications  0.9 %  sodium chloride infusion (not administered)  HYDROmorphone (DILAUDID) injection 2 mg (2 mg Intravenous Given 08/24/14 0736)  ketorolac (TORADOL) 15 MG/ML injection 15 mg (15 mg Intravenous Given 08/24/14 0736)  sodium chloride 0.9 % bolus 500 mL (0 mLs Intravenous Stopped 08/24/14 0919)  diphenhydrAMINE (BENADRYL) capsule 50 mg (50 mg Oral Given 08/24/14 0747)  HYDROmorphone (DILAUDID) injection 1 mg (1 mg Intravenous Given 08/24/14 0831)    Filed Vitals:   08/24/14 0658  BP: 117/60  Pulse: 102  Temp: 98.9 F (37.2 C)  TempSrc: Oral  Resp: 16  SpO2: 90%    Final diagnoses:  Sickle cell disease with crisis  Left leg swelling  Anemia, sickle cell with crisis    Admission/ observation were discussed with the admitting physician, patient and/or family and they are comfortable with the plan.    Elnora Morrison, MD 08/24/14 [phone]

## 2014-08-24 NOTE — Progress Notes (Signed)
Received page from RN around 4:15pm. She reports that pt is acting a bit more irritable and figidity than when seen during prior admissions. Went to see and evaluate pt. He appeared appropriate and answered all questions appropriately to person, place, and time. He reports being very tired as he had poor sleep last night. His doppler of his LLE returned and no DVT was seen. Will obtain an ammonia level, otherwise continue to monitor.  Real Cons, MD

## 2014-08-25 ENCOUNTER — Inpatient Hospital Stay (HOSPITAL_COMMUNITY): Payer: Medicare Other

## 2014-08-25 ENCOUNTER — Encounter: Payer: Self-pay | Admitting: Cardiology

## 2014-08-25 DIAGNOSIS — D638 Anemia in other chronic diseases classified elsewhere: Secondary | ICD-10-CM

## 2014-08-25 DIAGNOSIS — I2781 Cor pulmonale (chronic): Secondary | ICD-10-CM

## 2014-08-25 DIAGNOSIS — D5700 Hb-SS disease with crisis, unspecified: Secondary | ICD-10-CM

## 2014-08-25 DIAGNOSIS — I270 Primary pulmonary hypertension: Secondary | ICD-10-CM

## 2014-08-25 DIAGNOSIS — I10 Essential (primary) hypertension: Secondary | ICD-10-CM

## 2014-08-25 DIAGNOSIS — R600 Localized edema: Secondary | ICD-10-CM

## 2014-08-25 LAB — BASIC METABOLIC PANEL
ANION GAP: 4 — AB (ref 5–15)
BUN: 18 mg/dL (ref 6–20)
CO2: 22 mmol/L (ref 22–32)
Calcium: 8.6 mg/dL — ABNORMAL LOW (ref 8.9–10.3)
Chloride: 113 mmol/L — ABNORMAL HIGH (ref 101–111)
Creatinine, Ser: 0.62 mg/dL (ref 0.61–1.24)
GFR calc non Af Amer: >60 mL/min (ref >60)
Glucose, Bld: 101 mg/dL — ABNORMAL HIGH (ref 65–99)
POTASSIUM: 4.1 mmol/L (ref 3.5–5.1)
Sodium: 139 mmol/L (ref 135–145)

## 2014-08-25 LAB — CBC WITH DIFFERENTIAL/PLATELET
Basophils Absolute: 0.1 10*3/uL (ref 0.0–0.1)
Basophils Relative: 1 % (ref 0–1)
EOS PCT: 3 % (ref 0–5)
Eosinophils Absolute: 0.4 10*3/uL (ref 0.0–0.7)
HCT: 19.4 % — ABNORMAL LOW (ref 39.0–52.0)
HEMOGLOBIN: 6.6 g/dL — AB (ref 13.0–17.0)
LYMPHS ABS: 2.3 10*3/uL (ref 0.7–4.0)
LYMPHS PCT: 16 % (ref 12–46)
MCH: 30.6 pg (ref 26.0–34.0)
MCHC: 34 g/dL (ref 30.0–36.0)
MCV: 89.8 fL (ref 78.0–100.0)
MONO ABS: 2.3 10*3/uL — AB (ref 0.1–1.0)
MONOS PCT: 16 % — AB (ref 3–12)
Neutro Abs: 9.2 10*3/uL — ABNORMAL HIGH (ref 1.7–7.7)
Neutrophils Relative %: 64 % (ref 43–77)
Platelets: 366 10*3/uL (ref 150–400)
RBC: 2.16 MIL/uL — AB (ref 4.22–5.81)
RDW: 19.3 % — ABNORMAL HIGH (ref 11.5–15.5)
WBC: 14.3 10*3/uL — ABNORMAL HIGH (ref 4.0–10.5)

## 2014-08-25 LAB — RETICULOCYTES
RBC.: 2.16 MIL/uL — AB (ref 4.22–5.81)
RETIC CT PCT: 12 % — AB (ref 0.4–3.1)
Retic Count, Absolute: 259.2 10*3/uL — ABNORMAL HIGH (ref 19.0–186.0)

## 2014-08-25 LAB — LACTATE DEHYDROGENASE: LDH: 441 U/L — ABNORMAL HIGH (ref 98–192)

## 2014-08-25 IMAGING — CR DG CHEST 2V
2 series · 2 of 2 positions shown · non-contrast
Comparison: 09/04/2011.

CLINICAL DATA: Fever.  Leukocytosis.  Sickle cell crisis.

CHEST - 2 VIEW

[w chest pa]
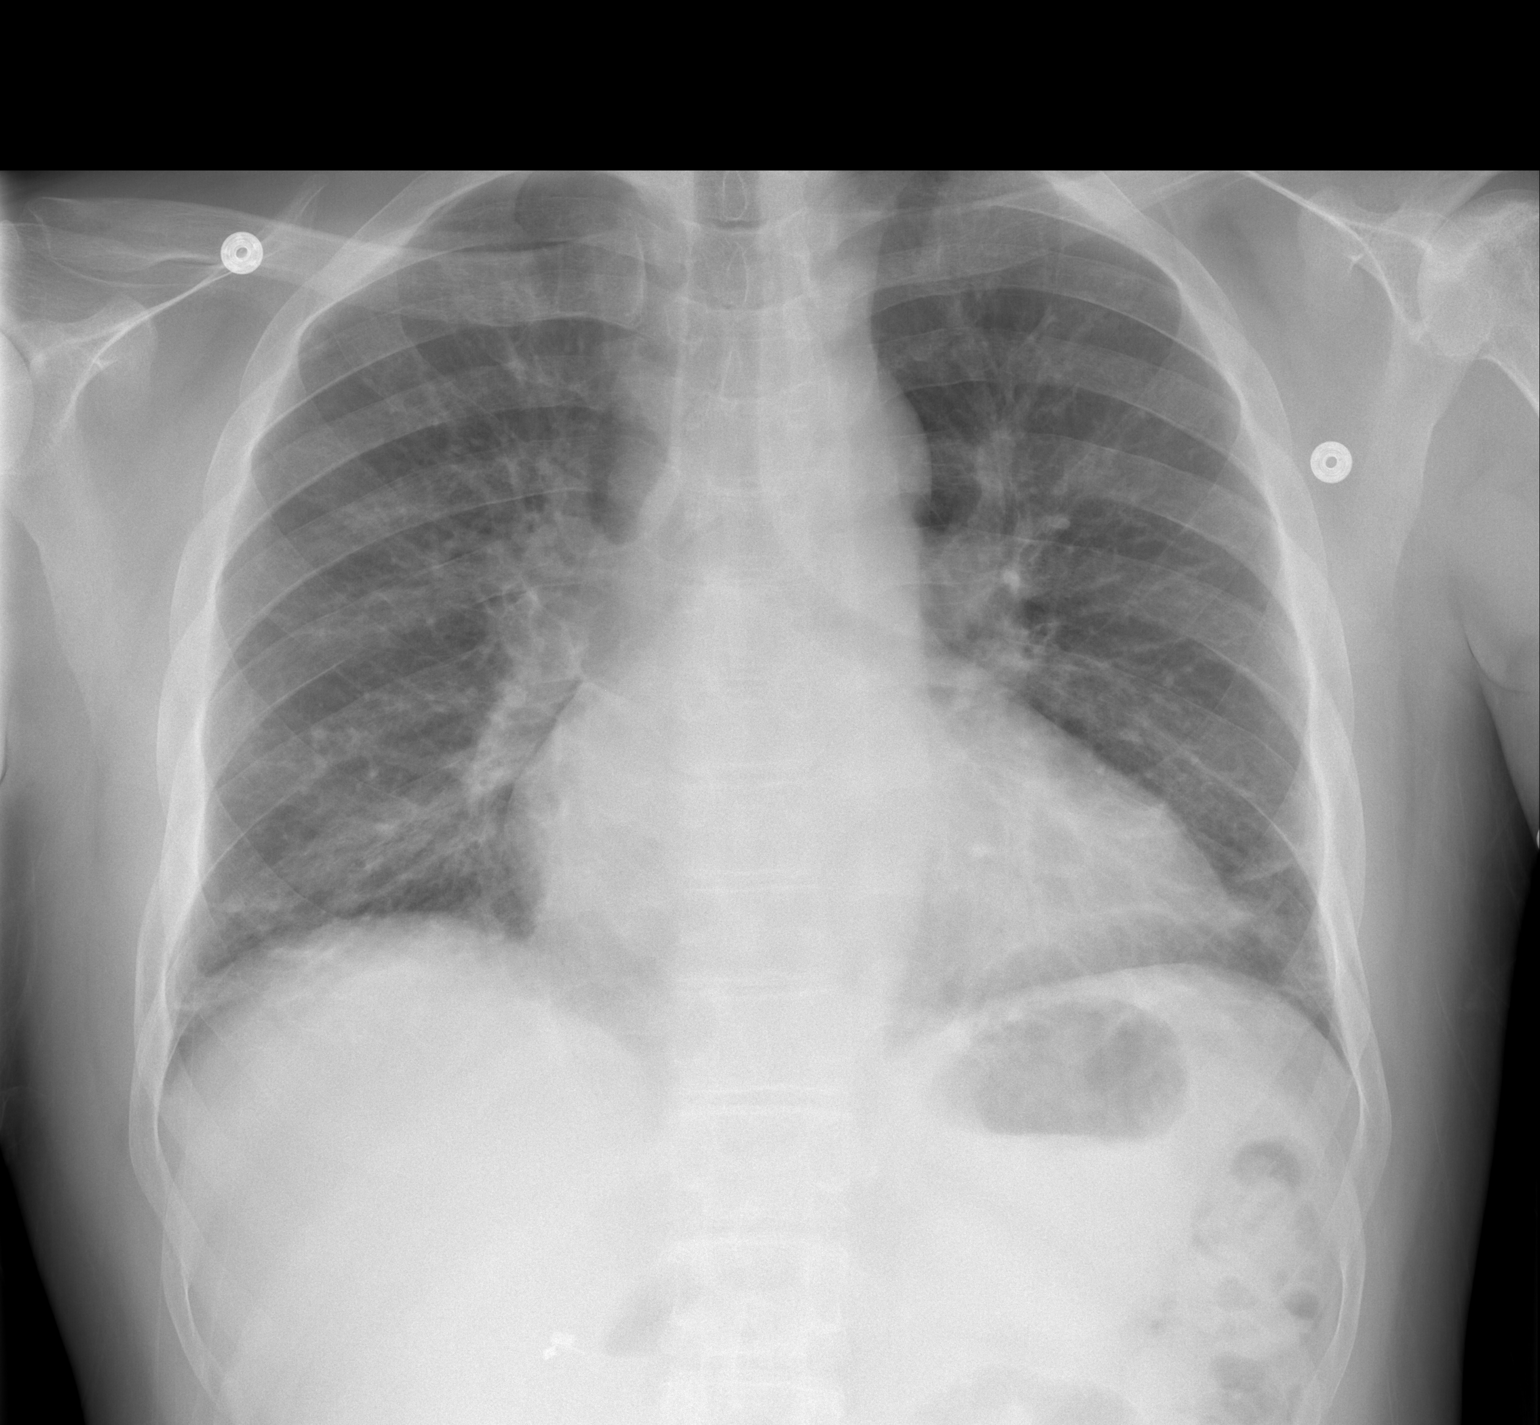

[w chest lat]
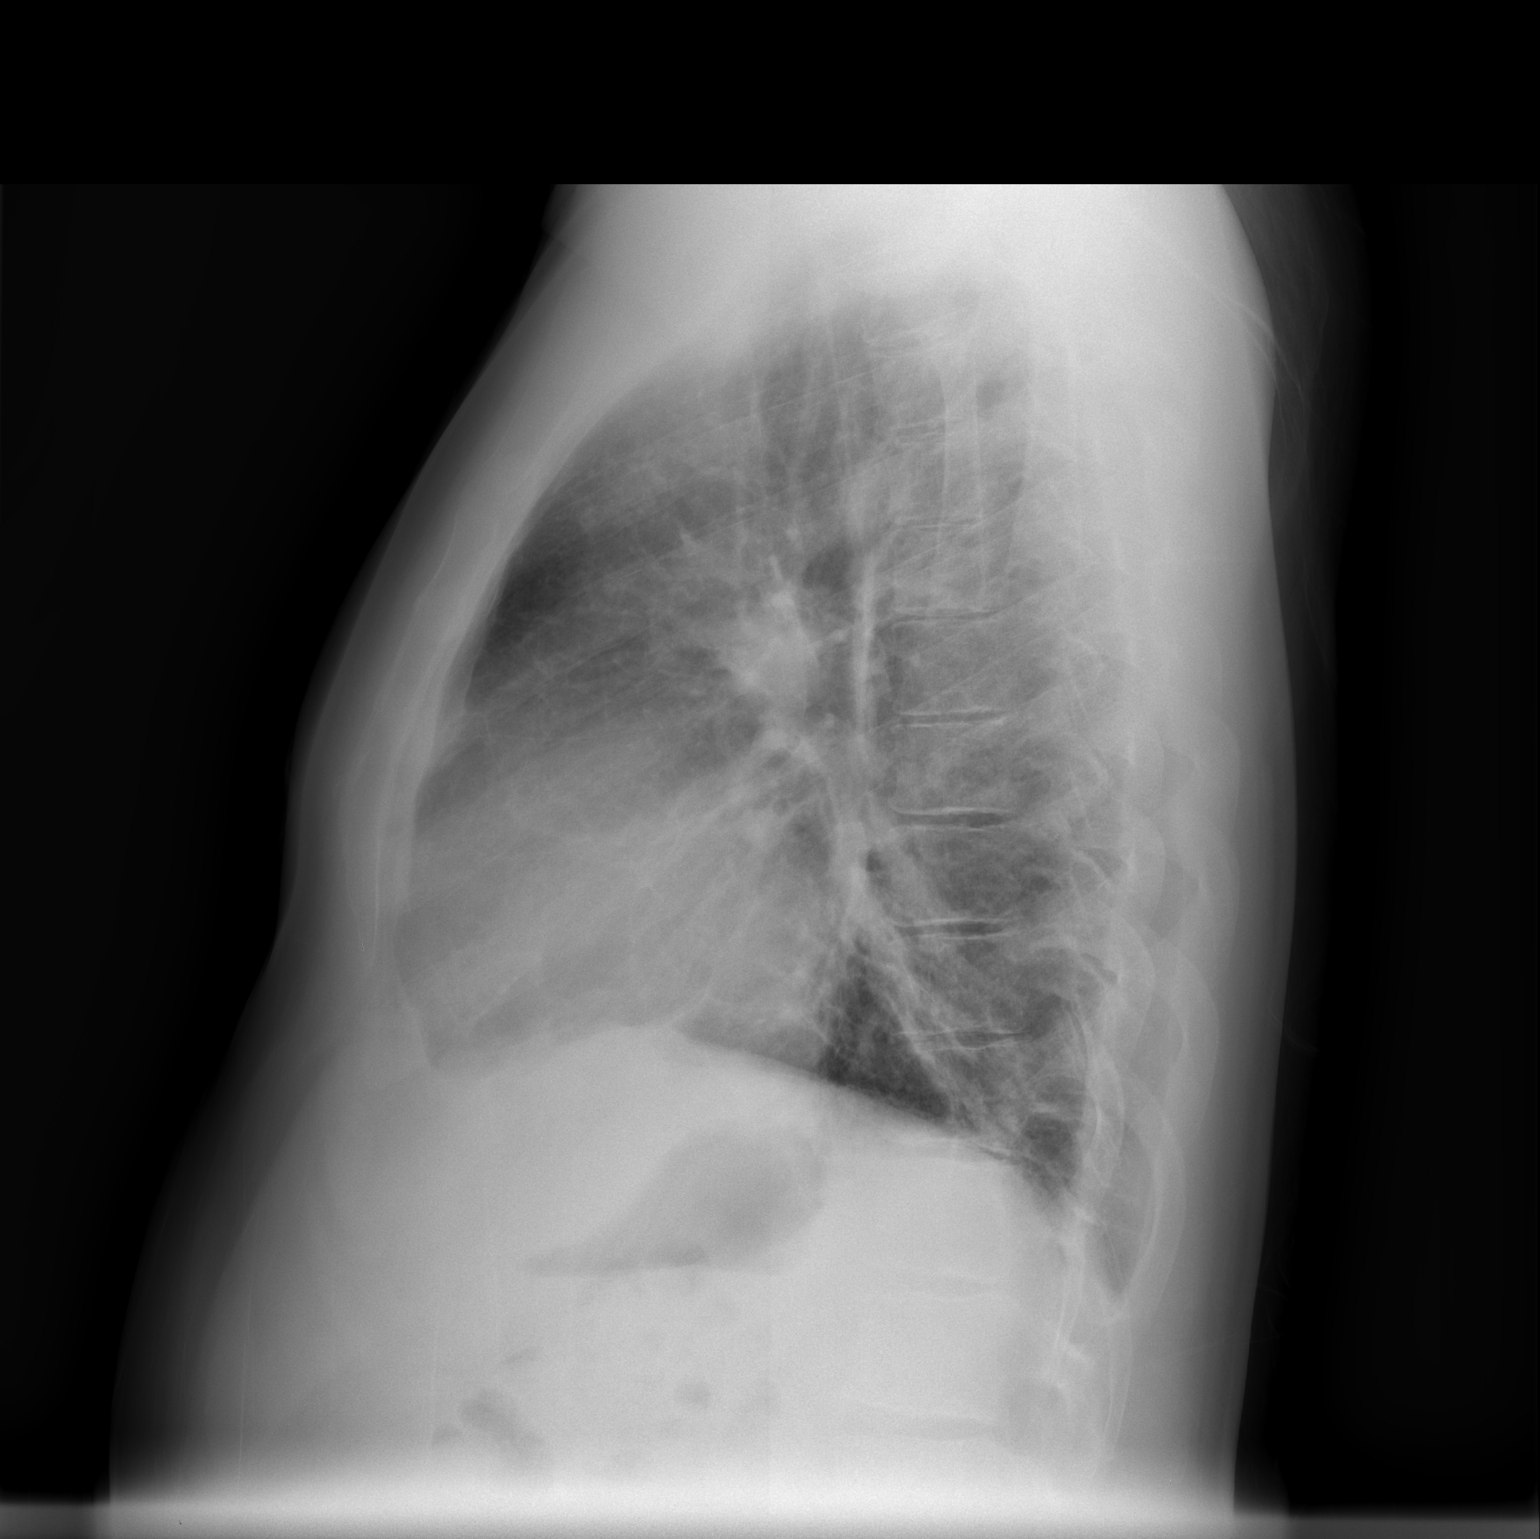

[2 of 2 positions shown; findings below may reference images not displayed]

FINDINGS: Enlarged cardiac silhouette with a mild increase in size.
Increased linear density at the lung bases.  Unremarkable bones.
Cholecystectomy clips.
IMPRESSION: 1.  Cardiomegaly with mild progression.
2.  Mild bibasilar atelectasis.

## 2014-08-25 MED ORDER — DIPHENHYDRAMINE HCL 25 MG PO CAPS
25.0000 mg | ORAL_CAPSULE | Freq: Four times a day (QID) | ORAL | Status: DC | PRN
  Filled 2014-08-25: qty 2

## 2014-08-25 MED ORDER — SODIUM CHLORIDE 0.9 % IV SOLN
25.0000 mg | INTRAVENOUS | Status: DC | PRN
Start: 2014-08-25 — End: 2014-08-27
  Administered 2014-08-25 – 2014-08-27 (×5): 25 mg via INTRAVENOUS
  Filled 2014-08-25 (×13): qty 0.5

## 2014-08-25 NOTE — Progress Notes (Signed)
Echocardiogram 2D Echocardiogram has been performed.  [name] 08/25/2014, 9:59 AM

## 2014-08-25 NOTE — Progress Notes (Signed)
SICKLE CELL SERVICE PROGRESS NOTE  YOMAR MEJORADO ONG:[id number] DOB: 09-Apr-1980 DOA: 08/24/2014 PCP: [name],[name], MD  Assessment/Plan: Active Problems:   Pulmonary HTN   Hx of pulmonary embolus   Chronic anticoagulation   Essential hypertension   Pulmonary embolus   Hb-SS disease with crisis   Hypoxia   Ankle edema   Sickle cell pain crisis  1. B/L Pedal edema:  Pt has known Cor-pulmonale and I suspect that the pedal is resultant from right heart failure. He reports that he he has no significant swelling when he awakens in the morning and the swelling appears and worsens as he stands on his feet all day and then gets better overnight. An ECHO was ordered by Dr. Alben Deeds and performed early to day. I doubt that this will add and information that will change the course of therapy or pint to any new information. However I will review the results when available. TED stockings ordered for patient.  2. Cor Pulmonale/Hypoxia: Oxygen levels are such that patient qualifies for Oxygen use. He reports that he is using 2-3 L/min intermittently as an out patient. He needs to follow up  3. Hb SS with crisis: Pt currently on Dilaudid PCA, MS Contin, Toradol and IVF. Pt reports that pain in improved but still at 7-8/10 localized to legs and back. Will continue PCA at current dosing and re-assess tomorrow for possible transition to oral analgesics. 4. Anemia: Pt has anemia of chronic disease. His current Hb is almost at baseline. Will re-assess tomorrow.    Recommend appointment with Dr. Felisa Bonier ASAP for assessment of PAH and Cor Pulmonale,  and specific question of recommendations for Hb level to be maintained.  ECHO: Reviewed. No change since last ECHO.  Code Status: Full Code Family Communication: N/A Disposition Plan: Ready for discharge  [name],[name]  Pager [phone]. If 7PM-7AM, please contact night-coverage.  08/25/2014, 9:41 AM  LOS: 1 day    Consultants:  None  Procedures:  None  Antibiotics:  None  HPI/Subjective: Pt reports pain in ribs and back now 7-8/10. He reports that he he has no significant swelling when he awakens in the morning and the swelling appears and worsens as he stands on his feet all day and then gets better overnight.   Objective: Filed Vitals:   08/25/14 0152 08/25/14 0352 08/25/14 0445 08/25/14 0753  BP: 129/85  131/70   Pulse: 93  87   Temp: 98.7 F (37.1 C)  98.8 F (37.1 C)   TempSrc: Oral  Oral   Resp: 21 17 19 23   Height:      Weight:      SpO2: 100% 98% 98% 96%   Weight change:   Intake/Output Summary (Last 24 hours) at 08/25/14 0941 Last data filed at 08/25/14 0445  Gross per 24 hour  Intake      0 ml  Output   1000 ml  Net  -1000 ml    General: Alert, awake, oriented x3, in no acute distress.  HEENT: /AT PEERL, EOMI, mild icterus Neck: Trachea midline,  no masses, no thyromegal,y (+) JVD, no carotid bruit OROPHARYNX:  Moist, No exudate/ erythema/lesions.  Heart: Regular rate and rhythm,  1/VI SEM at LUSB. No rubs or gallops and PMI non-displaced, no heaves or thrills on palpation.  Lungs: Clear to auscultation, no wheezing or rhonchi noted. No increased vocal fremitus resonant to percussion  Abdomen: Soft, nontender, nondistended, positive bowel sounds, no masses no hepatosplenomegaly noted.  Neuro: No focal neurological deficits  noted cranial nerves II through XII grossly intact. Strength at baseline in bilateral upper and lower extremities. Musculoskeletal: No warm swelling or erythema around joints, no spinal tenderness noted. Psychiatric: Patient alert and oriented x3, good insight and cognition, good recent to remote recall.    Data Reviewed: Basic Metabolic Panel:  Recent Labs Lab 08/18/14 0950 08/21/14 0213 08/24/14 0739 08/25/14 0515  NA 137 140 136 139  K 3.7 3.8 4.0 4.1  CL 110 110 108 113*  CO2 23 25 21* 22  GLUCOSE 95 116* 118* 101*  BUN 6  7 18 18   CREATININE 0.61 0.58* 0.66 0.62  CALCIUM 8.5* 8.7* 8.7* 8.6*   Liver Function Tests:  Recent Labs Lab 08/18/14 0950 08/21/14 0213  AST 30 37  ALT 26 26  ALKPHOS 79 80  BILITOT 4.3* 6.0*  PROT 7.4 7.6  ALBUMIN 4.1 4.1   No results for input(s): LIPASE, AMYLASE in the last 168 hours.  Recent Labs Lab 08/24/14 1655  AMMONIA 48*   CBC:  Recent Labs Lab 08/18/14 0950 08/21/14 0213 08/24/14 0739 08/25/14 0515  WBC 15.8* 15.5* 22.3* 14.3*  NEUTROABS 10.7* 9.7* 17.6* 9.2*  HGB 7.9* 7.4* 6.8* 6.6*  HCT 24.1* 22.6* 20.2* 19.4*  MCV 94.9 93.4 91.8 89.8  PLT 512* 415* 403* 366   Cardiac Enzymes: No results for input(s): CKTOTAL, CKMB, CKMBINDEX, TROPONINI in the last 168 hours. BNP (last 3 results) No results for input(s): BNP in the last 8760 hours.  ProBNP (last 3 results)  Recent Labs  01/01/14 0909  PROBNP 5790.0*    CBG: No results for input(s): GLUCAP in the last 168 hours.  No results found for this or any previous visit (from the past 240 hour(s)).   Studies: Dg Chest 2 View  08/24/2014   CLINICAL DATA:  Sickle cell crisis, shortness of breath  EXAM: CHEST  2 VIEW  COMPARISON:  08/05/2014  FINDINGS: Moderate enlargement of the cardiac silhouette is reidentified. Diffusely prominent interstitial markings are stable with areas of linear probable superimposed scarring. No pleural effusion. Cholecystectomy clips are noted. No acute osseous finding. Left-sided Port-A-Cath in place with tip at the cavoatrial junction.  IMPRESSION: No focal acute finding.   Electronically Signed   By: Conchita Paris M.D.   On: 08/24/2014 08:41   Dg Chest 2 View  08/03/2014   CLINICAL DATA:  Sickle cell pain crisis.  EXAM: CHEST  2 VIEW  COMPARISON:  07/20/2014  FINDINGS: Stable appearance of the left Port-A-Cath. Catheter tip in the lower SVC region. Prominent lung markings in the mid and lower lungs have not significantly changed. There is no significant airspace disease  or pulmonary edema. Heart size remains enlarged. No evidence for pleural effusions.  IMPRESSION: Stable mild cardiomegaly without acute lung findings.   Electronically Signed   By: Markus Daft M.D.   On: 08/03/2014 08:49   Dg Ankle Complete Left  08/24/2014   CLINICAL DATA:  Sickle cell crisis with left lower leg and ankle pain and swelling.  EXAM: LEFT ANKLE COMPLETE - 3+ VIEW  COMPARISON:  12/21/2006  FINDINGS: Diffuse soft tissue swelling is seen surrounding the left ankle. No evidence of fracture, dislocation or bony destruction. The ankle mortise shows normal alignment. No bony lesions or visible bony infarcts by x-ray. No significant degenerative changes.  IMPRESSION: Soft tissue swelling surrounding left ankle. No underlying bony abnormalities are identified.   Electronically Signed   By: Aletta Edouard M.D.   On: 08/24/2014 08:35  Ir Fluoro Guide Cv Line Right  08/06/2014   CLINICAL DATA:  Sickle cell  EXAM: RIGHT UPPER EXTREMITY PICC LINE PLACEMENT WITH ULTRASOUND AND FLUOROSCOPIC GUIDANCE  FLUOROSCOPY TIME:  12 seconds  PROCEDURE: The patient was advised of the possible risks and complications and agreed to undergo the procedure. The patient was then brought to the angiographic suite for the procedure.  The right arm was prepped with chlorhexidine, draped in the usual sterile fashion using maximum barrier technique (cap and mask, sterile gown, sterile gloves, large sterile sheet, hand hygiene and cutaneous antisepsis) and infiltrated locally with 1% Lidocaine.  Ultrasound demonstrated patency of the right basilic vein, and this was documented with an image. Under real-time ultrasound guidance, this vein was accessed with a 21 gauge micropuncture needle and image documentation was performed. A 0.018 wire was introduced in to the vein. Over this, a 5 [place] double lumen power PICC was advanced to the lower SVC/right atrial junction. Fluoroscopy during the procedure and fluoro spot radiograph confirms  appropriate catheter position. The catheter was flushed and covered with a sterile dressing.  COMPLICATIONS: None  LENGTH: 45 cm  IMPRESSION: Successful right arm power PICC line placement with ultrasound and fluoroscopic guidance. The catheter is ready for use.   Electronically Signed   By: Marybelle Killings M.D.   On: 08/06/2014 13:16   Ir US Guide Vasc Access Right  08/06/2014   CLINICAL DATA:  Sickle cell  EXAM: RIGHT UPPER EXTREMITY PICC LINE PLACEMENT WITH ULTRASOUND AND FLUOROSCOPIC GUIDANCE  FLUOROSCOPY TIME:  12 seconds  PROCEDURE: The patient was advised of the possible risks and complications and agreed to undergo the procedure. The patient was then brought to the angiographic suite for the procedure.  The right arm was prepped with chlorhexidine, draped in the usual sterile fashion using maximum barrier technique (cap and mask, sterile gown, sterile gloves, large sterile sheet, hand hygiene and cutaneous antisepsis) and infiltrated locally with 1% Lidocaine.  Ultrasound demonstrated patency of the right basilic vein, and this was documented with an image. Under real-time ultrasound guidance, this vein was accessed with a 21 gauge micropuncture needle and image documentation was performed. A 0.018 wire was introduced in to the vein. Over this, a 5 [place] double lumen power PICC was advanced to the lower SVC/right atrial junction. Fluoroscopy during the procedure and fluoro spot radiograph confirms appropriate catheter position. The catheter was flushed and covered with a sterile dressing.  COMPLICATIONS: None  LENGTH: 45 cm  IMPRESSION: Successful right arm power PICC line placement with ultrasound and fluoroscopic guidance. The catheter is ready for use.   Electronically Signed   By: Marybelle Killings M.D.   On: 08/06/2014 13:16   Dg Chest Port 1 View  08/05/2014   CLINICAL DATA:  Hypoxia.  Sickle cell pain crisis  EXAM: PORTABLE CHEST - 1 VIEW  COMPARISON:  08/03/2014  FINDINGS: Chronic cardiomegaly. Aortic  and hilar contours are stable and negative. Stable left subclavian porta catheter, tip at the upper right atrium.  Chronic reticulation of lung markings consistent with scarring. There is no edema, consolidation, effusion, or pneumothorax.  No acute osseous findings.  IMPRESSION: Stable exam.  No evidence of acute cardiopulmonary disease.   Electronically Signed   By: Monte Fantasia M.D.   On: 08/05/2014 06:08    Scheduled Meds: . aspirin  81 mg Oral Daily  . cholecalciferol  2,000 Units Oral Daily  . diltiazem  120 mg Oral Daily  . enoxaparin  110 mg Subcutaneous  [id number]  . folic acid  1 mg Oral q morning - 10a  . HYDROmorphone PCA 2 mg/mL   Intravenous 6 times per day  . hydroxyurea  1,500 mg Oral Daily  . ketorolac  30 mg Intravenous 4 times per day  . lisinopril  10 mg Oral Daily  . metoprolol tartrate  25 mg Oral Daily  . morphine  30 mg Oral Q12H  . potassium chloride SA  20 mEq Oral q morning - 10a   Continuous Infusions:    Time spent 40 minutes.

## 2014-08-25 NOTE — Progress Notes (Signed)
Utilization review completed.  

## 2014-08-26 LAB — BASIC METABOLIC PANEL
ANION GAP: 8 (ref 5–15)
BUN: 15 mg/dL (ref 6–20)
CALCIUM: 8.7 mg/dL — AB (ref 8.9–10.3)
CO2: 20 mmol/L — ABNORMAL LOW (ref 22–32)
Chloride: 112 mmol/L — ABNORMAL HIGH (ref 101–111)
Creatinine, Ser: 0.56 mg/dL — ABNORMAL LOW (ref 0.61–1.24)
GFR calc Af Amer: >60 mL/min (ref >60)
GFR calc non Af Amer: >60 mL/min (ref >60)
Glucose, Bld: 114 mg/dL — ABNORMAL HIGH (ref 65–99)
Potassium: 3.9 mmol/L (ref 3.5–5.1)
Sodium: 140 mmol/L (ref 135–145)

## 2014-08-26 LAB — CBC WITH DIFFERENTIAL/PLATELET
BASOS PCT: 1 % (ref 0–1)
Basophils Absolute: 0.1 10*3/uL (ref 0.0–0.1)
EOS ABS: 0.1 10*3/uL (ref 0.0–0.7)
EOS PCT: 1 % (ref 0–5)
HEMATOCRIT: 18.7 % — AB (ref 39.0–52.0)
Hemoglobin: 6.3 g/dL — CL (ref 13.0–17.0)
Lymphocytes Relative: 18 % (ref 12–46)
Lymphs Abs: 2.4 10*3/uL (ref 0.7–4.0)
MCH: 30 pg (ref 26.0–34.0)
MCHC: 33.7 g/dL (ref 30.0–36.0)
MCV: 89 fL (ref 78.0–100.0)
Monocytes Absolute: 2.1 10*3/uL — ABNORMAL HIGH (ref 0.1–1.0)
Monocytes Relative: 16 % — ABNORMAL HIGH (ref 3–12)
NEUTROS ABS: 8.4 10*3/uL — AB (ref 1.7–7.7)
NEUTROS PCT: 64 % (ref 43–77)
Platelets: 366 10*3/uL (ref 150–400)
RBC: 2.1 MIL/uL — AB (ref 4.22–5.81)
RDW: 19.1 % — ABNORMAL HIGH (ref 11.5–15.5)
WBC: 13.1 10*3/uL — ABNORMAL HIGH (ref 4.0–10.5)

## 2014-08-26 MED ORDER — HYDROMORPHONE HCL 4 MG PO TABS
4.0000 mg | ORAL_TABLET | ORAL | Status: DC
  Administered 2014-08-26 – 2014-08-27 (×5): 4 mg via ORAL
  Filled 2014-08-26 (×5): qty 1

## 2014-08-26 NOTE — Care Management Note (Signed)
Case Management Note  Patient Details  Name: PASCHAL BLANTON MRN: [id number] Date of Birth: 22-Sep-1980  Subjective/Objective:               34 yo admitted with [facility]     Action/Plan: From home alone.  Expected Discharge Date:                  Expected Discharge Plan:  Home/Self Care  In-House Referral:     Discharge planning Services  CM Consult  Post Acute Care Choice:    Choice offered to:     DME Arranged:  Oxygen DME Agency:  [place]:    [facility] Agency:     Status of Service:  In process, will continue to follow  Medicare Important Message Given:    Date Medicare IM Given:    Medicare IM give by:    Date Additional Medicare IM Given:    Additional Medicare Important Message give by:     If discussed at [place] of Stay Meetings, dates discussed:    Additional Comments: Pt uses home 02 intermittently. CM will continue to follow for DC needs. Lynnell Catalan, RN 08/26/2014, 1:55 PM

## 2014-08-26 NOTE — Progress Notes (Signed)
SICKLE CELL SERVICE PROGRESS NOTE  [name] YBO:[id number] DOB: 22-Jun-1980 DOA: 08/24/2014 PCP: Jyl [place] A., MD  Assessment/Plan: Active Problems:   Pulmonary HTN   Cor pulmonale, chronic   Chronic anticoagulation   Pedal edema   Hx of pulmonary embolus   Essential hypertension   Hb-SS disease with crisis   Hypoxia   Anemia of chronic disease   Ankle edema  1. Hb SS wit crisis: Pt currently on Dilaudid PCA, MS Contin, Toradol and IVF. Pt reports that pain in improved and is at 6-7/10 localized to legs and back. Will continue PCA as PRN medication and schedule oral analgesics in anticipation of discharge home tomorrow.. 2. B/L Pedal edema:  Swelling improved with elevation of BLE's. TED hose ordered but not yet applied. I have reviewed ECHO results and it again shows Cor-pulmonale with EF unchanged. I suspect cor-pulmonale to be the cause of the pedal edema.  3. Cor Pulmonale with suspected Hypoxia: Pt ambulated with  % oxygen saturations remaining in the low 90's. He does have nocturnal hypoxia and uses Oxygen at night at home. He will need to be evaluated during the convalescent stage for need for daytime oxygen supplementation. 4. Anemia: Pt has anemia of chronic disease. His current Hb is almost at baseline. Will continue to monitor and re-assess tomorrow.    Recommend appointment with Dr. Felisa Bonier ASAP for assessment of PAH and Cor Pulmonale,  and specific question of recommendations for Hb level to be maintained.  ECHO: Reviewed. No change since last ECHO.  Code Status: Full Code Family Communication: N/A Disposition Plan: Ready for discharge  Tashawn Greff A.  Pager [phone]. If 7PM-7AM, please contact night-coverage.  08/26/2014, 10:27 AM  LOS: 2 days   Consultants:  None  Procedures:  None  Antibiotics:  None  HPI/Subjective: Pt reports pain in ribs and back now 6-7/10, throbbing in nature and non-radiating. Last BM  yesterday. Objective: Filed Vitals:   08/26/14 0400 08/26/14 0511 08/26/14 0620 08/26/14 0800  BP:  128/78    Pulse:  75    Temp:  98.1 F (36.7 C)    TempSrc:  Oral    Resp: 16 17    Height:      Weight:   151 lb 7.3 oz (68.7 kg)   SpO2: 94% 94%  93%   Weight change: -11 lb 8.7 oz (-5.236 kg)  Intake/Output Summary (Last 24 hours) at 08/26/14 1027 Last data filed at 08/26/14 0900  Gross per 24 hour  Intake    264 ml  Output    975 ml  Net   -711 ml    General: Alert, awake, oriented x3, in no acute distress.  HEENT: [place]/AT PEERL, EOMI, mild icterus Neck: Trachea midline,  no masses, no thyromegal,y or JVD, no carotid bruit OROPHARYNX:  Moist, No exudate/ erythema/lesions.  Heart: Regular rate and rhythm,  1/VI SEM at LUSB. No rubs or gallops and PMI non-displaced, no heaves or thrills on palpation.  Lungs: Clear to auscultation, no wheezing or rhonchi noted. No increased vocal fremitus resonant to percussion  Abdomen: Soft, nontender, nondistended, positive bowel sounds, no masses no hepatosplenomegaly noted.  Neuro: No focal neurological deficits noted cranial nerves II through XII grossly intact. Strength at baseline in bilateral upper and lower extremities. Musculoskeletal: 2+ swelling of feet and ankles. No warmth  or erythema around joints, no spinal tenderness noted. Psychiatric: Patient alert and oriented x3, good insight and cognition, good recent to remote recall.    Data Reviewed: Basic  Metabolic Panel:  Recent Labs Lab 08/21/14 0213 08/24/14 0739 08/25/14 0515 08/26/14 0525  NA 140 136 139 140  K 3.8 4.0 4.1 3.9  CL 110 108 113* 112*  CO2 25 21* 22 20*  GLUCOSE 116* 118* 101* 114*  BUN 7 18 18 15   CREATININE 0.58* 0.66 0.62 0.56*  CALCIUM 8.7* 8.7* 8.6* 8.7*   Liver Function Tests:  Recent Labs Lab 08/21/14 0213  AST 37  ALT 26  ALKPHOS 80  BILITOT 6.0*  PROT 7.6  ALBUMIN 4.1   No results for input(s): LIPASE, AMYLASE in the last 168  hours.  Recent Labs Lab 08/24/14 1655  AMMONIA 48*   CBC:  Recent Labs Lab 08/21/14 0213 08/24/14 0739 08/25/14 0515 08/26/14 0525  WBC 15.5* 22.3* 14.3* 13.1*  NEUTROABS 9.7* 17.6* 9.2* 8.4*  HGB 7.4* 6.8* 6.6* 6.3*  HCT 22.6* 20.2* 19.4* 18.7*  MCV 93.4 91.8 89.8 89.0  PLT 415* 403* 366 366   Cardiac Enzymes: No results for input(s): CKTOTAL, CKMB, CKMBINDEX, TROPONINI in the last 168 hours. BNP (last 3 results) No results for input(s): BNP in the last 8760 hours.  ProBNP (last 3 results)  Recent Labs  01/01/14 0909  PROBNP 5790.0*    CBG: No results for input(s): GLUCAP in the last 168 hours.  No results found for this or any previous visit (from the past 240 hour(s)).   Studies: Dg Chest 2 View  08/24/2014   CLINICAL DATA:  Sickle cell crisis, shortness of breath  EXAM: CHEST  2 VIEW  COMPARISON:  08/05/2014  FINDINGS: Moderate enlargement of the cardiac silhouette is reidentified. Diffusely prominent interstitial markings are stable with areas of linear probable superimposed scarring. No pleural effusion. Cholecystectomy clips are noted. No acute osseous finding. Left-sided Port-A-Cath in place with tip at the cavoatrial junction.  IMPRESSION: No focal acute finding.   Electronically Signed   By: Conchita Paris M.D.   On: 08/24/2014 08:41   Dg Chest 2 View  08/03/2014   CLINICAL DATA:  Sickle cell pain crisis.  EXAM: CHEST  2 VIEW  COMPARISON:  07/20/2014  FINDINGS: Stable appearance of the left Port-A-Cath. Catheter tip in the lower SVC region. Prominent lung markings in the mid and lower lungs have not significantly changed. There is no significant airspace disease or pulmonary edema. Heart size remains enlarged. No evidence for pleural effusions.  IMPRESSION: Stable mild cardiomegaly without acute lung findings.   Electronically Signed   By: Markus Daft M.D.   On: 08/03/2014 08:49   Dg Ankle Complete Left  08/24/2014   CLINICAL DATA:  Sickle cell crisis with  left lower leg and ankle pain and swelling.  EXAM: LEFT ANKLE COMPLETE - 3+ VIEW  COMPARISON:  12/21/2006  FINDINGS: Diffuse soft tissue swelling is seen surrounding the left ankle. No evidence of fracture, dislocation or bony destruction. The ankle mortise shows normal alignment. No bony lesions or visible bony infarcts by x-ray. No significant degenerative changes.  IMPRESSION: Soft tissue swelling surrounding left ankle. No underlying bony abnormalities are identified.   Electronically Signed   By: Aletta Edouard M.D.   On: 08/24/2014 08:35   Ir Fluoro Guide Cv Line Right  08/06/2014   CLINICAL DATA:  Sickle cell  EXAM: RIGHT UPPER EXTREMITY PICC LINE PLACEMENT WITH ULTRASOUND AND FLUOROSCOPIC GUIDANCE  FLUOROSCOPY TIME:  12 seconds  PROCEDURE: The patient was advised of the possible risks and complications and agreed to undergo the procedure. The patient was then brought to  the angiographic suite for the procedure.  The right arm was prepped with chlorhexidine, draped in the usual sterile fashion using maximum barrier technique (cap and mask, sterile gown, sterile gloves, large sterile sheet, hand hygiene and cutaneous antisepsis) and infiltrated locally with 1% Lidocaine.  Ultrasound demonstrated patency of the right basilic vein, and this was documented with an image. Under real-time ultrasound guidance, this vein was accessed with a 21 gauge micropuncture needle and image documentation was performed. A 0.018 wire was introduced in to the vein. Over this, a 5 [place] double lumen power PICC was advanced to the lower SVC/right atrial junction. Fluoroscopy during the procedure and fluoro spot radiograph confirms appropriate catheter position. The catheter was flushed and covered with a sterile dressing.  COMPLICATIONS: None  LENGTH: 45 cm  IMPRESSION: Successful right arm power PICC line placement with ultrasound and fluoroscopic guidance. The catheter is ready for use.   Electronically Signed   By: Marybelle Killings M.D.   On: 08/06/2014 13:16   Ir US Guide Vasc Access Right  08/06/2014   CLINICAL DATA:  Sickle cell  EXAM: RIGHT UPPER EXTREMITY PICC LINE PLACEMENT WITH ULTRASOUND AND FLUOROSCOPIC GUIDANCE  FLUOROSCOPY TIME:  12 seconds  PROCEDURE: The patient was advised of the possible risks and complications and agreed to undergo the procedure. The patient was then brought to the angiographic suite for the procedure.  The right arm was prepped with chlorhexidine, draped in the usual sterile fashion using maximum barrier technique (cap and mask, sterile gown, sterile gloves, large sterile sheet, hand hygiene and cutaneous antisepsis) and infiltrated locally with 1% Lidocaine.  Ultrasound demonstrated patency of the right basilic vein, and this was documented with an image. Under real-time ultrasound guidance, this vein was accessed with a 21 gauge micropuncture needle and image documentation was performed. A 0.018 wire was introduced in to the vein. Over this, a 5 [place] double lumen power PICC was advanced to the lower SVC/right atrial junction. Fluoroscopy during the procedure and fluoro spot radiograph confirms appropriate catheter position. The catheter was flushed and covered with a sterile dressing.  COMPLICATIONS: None  LENGTH: 45 cm  IMPRESSION: Successful right arm power PICC line placement with ultrasound and fluoroscopic guidance. The catheter is ready for use.   Electronically Signed   By: Marybelle Killings M.D.   On: 08/06/2014 13:16   Dg Chest Port 1 View  08/05/2014   CLINICAL DATA:  Hypoxia.  Sickle cell pain crisis  EXAM: PORTABLE CHEST - 1 VIEW  COMPARISON:  08/03/2014  FINDINGS: Chronic cardiomegaly. Aortic and hilar contours are stable and negative. Stable left subclavian porta catheter, tip at the upper right atrium.  Chronic reticulation of lung markings consistent with scarring. There is no edema, consolidation, effusion, or pneumothorax.  No acute osseous findings.  IMPRESSION: Stable exam.  No  evidence of acute cardiopulmonary disease.   Electronically Signed   By: Monte Fantasia M.D.   On: 08/05/2014 06:08    Scheduled Meds: . aspirin  81 mg Oral Daily  . cholecalciferol  2,000 Units Oral Daily  . diltiazem  120 mg Oral Daily  . enoxaparin  110 mg Subcutaneous Q24H  . folic acid  1 mg Oral q morning - 10a  . HYDROmorphone PCA 2 mg/mL   Intravenous 6 times per day  . HYDROmorphone  4 mg Oral Q4H  . hydroxyurea  1,500 mg Oral Daily  . ketorolac  30 mg Intravenous 4 times per day  . lisinopril  10  mg Oral Daily  . metoprolol tartrate  25 mg Oral Daily  . morphine  30 mg Oral Q12H  . potassium chloride SA  20 mEq Oral q morning - 10a   Continuous Infusions:    Time spent 32 minutes.

## 2014-08-26 NOTE — Progress Notes (Signed)
Patient o2 sat while ambulating in hallway was 92 % on room air.

## 2014-08-26 NOTE — Clinical Documentation Improvement (Signed)
08/25/14: progr note.Marland KitchenMarland KitchenB/L Pedal edema: Pt has known Cor-pulmonale and I suspect that the pedal is resultant from right heart failure. He reports that he he has no significant swelling when he awakens in the morning and the swelling appears and worsens as he stands on his feet all day and then gets better overnight. An ECHO was ordered by Dr. Alben Deeds and performed early to day. I doubt that this will add and information that will change the course of therapy or pint to any new information. However I will review the results when available. TED stockings ordered for patient."... 08/26/14: No recent BNP noted in EMR  For accurate Dx specificity & severity can noted " Right heart failure" be further specified w/ type & acuity. Thank you . Document acuity --Acute --Chronic --Acute on Chronic . Document type --Diastolic --Systolic --Combined systolic and diastolic . Due to or associated with --Cardiac or other surgery --Hypertension --Valvular disease --Rheumatic heart disease Endocarditis (valvitis) Pericarditis Myocarditis --Other (specify)  Supporting Information: see above note   Thank You,  Ermelinda Das, RN, BSN, CCDS Certified Clinical Documentation Specialist Pager: [phone]: Health Information Management

## 2014-08-26 NOTE — Care Management Important Message (Signed)
Important Message  Patient Details  Name: WENDELL NICOSON MRN: [id number] Date of Birth: [birth year]   Medicare Important Message Given:       [name] 08/26/2014, 2:07 [place] Message  Patient Details  Name: MAJID MCCRAVY MRN: [id number] Date of Birth: May 09, 1980   Medicare Important Message Given:       [name] 08/26/2014, 2:07 PM

## 2014-08-26 NOTE — Progress Notes (Signed)
Teds stockings applied since yesterday but patient takes it off most of the time.

## 2014-08-26 NOTE — Clinical Documentation Improvement (Signed)
08/25/14 progr note.Marland KitchenMarland Kitchen"Cor Pulmonale/Hypoxia: Oxygen levels are such that patient qualifies for Oxygen use. He reports that he is using 2-3 L/min intermittently as an out patient. He needs to follow up ."... 08/24/14:  O2 sats  On room air:@ 7253: 90;  @ 0700: 89; @ 0730: 93 08/24/14: conts O2 @ 2L/min via [place]  For accurate Dx specificity & severity can noted findings be linked to possible, probable or suspected condition being eval'd, mon'd & tx'd. Thank you  Possible Clinical Conditions? Acute Respiratory Failure Acute on Chronic Respiratory Failure Chronic Respiratory Failure Acute Respiratory Insufficiency Other Condition Cannot Clinically Determine   Supporting Information: see above note  Thank You, Ermelinda Das, RN, BSN, CCDS Certified Clinical Documentation Specialist Pager: [phone] [place]: Health Information Management

## 2014-08-27 DIAGNOSIS — Z7901 Long term (current) use of anticoagulants: Secondary | ICD-10-CM

## 2014-08-27 DIAGNOSIS — R0902 Hypoxemia: Secondary | ICD-10-CM

## 2014-08-27 LAB — CBC WITH DIFFERENTIAL/PLATELET
Basophils Absolute: 0 10*3/uL (ref 0.0–0.1)
Basophils Relative: 0 % (ref 0–1)
Eosinophils Absolute: 0.3 10*3/uL (ref 0.0–0.7)
Eosinophils Relative: 2 % (ref 0–5)
HCT: 18 % — ABNORMAL LOW (ref 39.0–52.0)
Hemoglobin: 6.1 g/dL — CL (ref 13.0–17.0)
Lymphocytes Relative: 22 % (ref 12–46)
Lymphs Abs: 2.9 10*3/uL (ref 0.7–4.0)
MCH: 29.6 pg (ref 26.0–34.0)
MCHC: 33.9 g/dL (ref 30.0–36.0)
MCV: 87.4 fL (ref 78.0–100.0)
Monocytes Absolute: 2.2 10*3/uL — ABNORMAL HIGH (ref 0.1–1.0)
Monocytes Relative: 17 % — ABNORMAL HIGH (ref 3–12)
NEUTROS PCT: 59 % (ref 43–77)
Neutro Abs: 7.8 10*3/uL — ABNORMAL HIGH (ref 1.7–7.7)
PLATELETS: 356 10*3/uL (ref 150–400)
RBC: 2.06 MIL/uL — ABNORMAL LOW (ref 4.22–5.81)
RDW: 19.5 % — AB (ref 11.5–15.5)
WBC: 13.2 10*3/uL — AB (ref 4.0–10.5)
nRBC: 2 /100 WBC — ABNORMAL HIGH

## 2014-08-27 LAB — RETICULOCYTES
RBC.: 2.06 MIL/uL — AB (ref 4.22–5.81)
RETIC CT PCT: 16.7 % — AB (ref 0.4–3.1)
Retic Count, Absolute: 344 10*3/uL — ABNORMAL HIGH (ref 19.0–186.0)

## 2014-08-27 MED ORDER — HEPARIN SOD (PORK) LOCK FLUSH 100 UNIT/ML IV SOLN
500.0000 [IU] | Freq: Once | INTRAVENOUS | Status: DC
  Filled 2014-08-27: qty 5

## 2014-08-27 NOTE — Progress Notes (Signed)
Wasted 21 ml of high dose dilaudid pca 2mg /ml

## 2014-08-27 NOTE — Discharge Summary (Signed)
[name] MRN: [id number] DOB/AGE: 08-03-1980 34 y.o.  Admit date: 08/24/2014 Discharge date: 08/27/2014  Primary Care Physician:  [name], MD   Discharge Diagnoses:   Patient Active Problem List   Diagnosis Date Noted  . Cor pulmonale, chronic 08/25/2014    Priority: High  . Pulmonary HTN 06/18/2013    Priority: High  . Functional asplenia     Priority: High  . Pedal edema 08/25/2014    Priority: Medium  . Chronic anticoagulation 08/22/2013    Priority: Medium  . Avascular necrosis     Priority: Medium  . Ankle edema 07/07/2014  . Sickle cell anemia 06/25/2014  . Anemia of chronic disease 06/25/2014  . PAH (pulmonary artery hypertension) 03/18/2014  . Hypoxia 03/14/2014  . 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 Paralytic strabismus, external ophthalmoplegia   . Chronic pain syndrome 12/12/2013  . Essential hypertension 08/22/2013  . Vitamin D deficiency 02/13/2013  . Uses marijuana 01/13/2013  . Embolism, pulmonary with infarction 07/09/2012  . Hx of pulmonary embolus 06/29/2012  . Hemochromatosis 12/14/2011    DISCHARGE MEDICATION:   Medication List    TAKE these medications        aspirin 81 MG chewable tablet  Chew 1 tablet (81 mg total) by mouth daily.     diltiazem 120 MG 24 hr capsule  Commonly known as:  CARDIZEM CD  Take 1 capsule (120 mg total) by mouth daily.     enoxaparin 150 MG/ML injection  Commonly known as:  LOVENOX  Inject 0.73 mLs (110 mg total) into the skin daily. Starting tonight 07/23/2014 at 29:47ML     folic acid 1 MG tablet  Commonly known as:  FOLVITE  Take 1 tablet (1 mg total) by mouth every morning.     HYDROmorphone 4 MG tablet  Commonly known as:  DILAUDID  Take 1 tablet (4 mg total) by mouth every 4 (four) hours as needed for severe pain.     hydroxyurea 500 MG capsule  Commonly known as:  HYDREA  Take 3 capsules (1,500 mg total) by mouth daily. May take with food to minimize GI side effects.      lisinopril 10 MG tablet  Commonly known as:  PRINIVIL,ZESTRIL  Take 1 tablet (10 mg total) by mouth daily.     metoprolol tartrate 25 MG tablet  Commonly known as:  LOPRESSOR  Take 1 tablet (25 mg total) by mouth daily.     morphine 30 MG 12 hr tablet  Commonly known as:  MS CONTIN  Take 1 tablet (30 mg total) by mouth every 12 (twelve) hours.     potassium chloride SA 20 MEQ tablet  Commonly known as:  K-DUR,KLOR-CON  Take 1 tablet (20 mEq total) by mouth every morning.     Vitamin D 2000 UNITS tablet  Take 1 tablet (2,000 Units total) by mouth daily.     zolpidem 10 MG tablet  Commonly known as:  AMBIEN  Take 1 tablet (10 mg total) by mouth at bedtime as needed for sleep.          Consults: None   SIGNIFICANT DIAGNOSTIC STUDIES:  Dg Chest 2 View  08/24/2014   CLINICAL DATA:  Sickle cell crisis, shortness of breath  EXAM: CHEST  2 VIEW  COMPARISON:  08/05/2014  FINDINGS: Moderate enlargement of the cardiac silhouette is reidentified. Diffusely prominent interstitial markings are stable with areas of linear probable superimposed scarring. No pleural effusion. Cholecystectomy clips are noted. No acute osseous  finding. Left-sided Port-A-Cath in place with tip at the cavoatrial junction.  IMPRESSION: No focal acute finding.   Electronically Signed   By: Conchita Paris M.D.   On: 08/24/2014 08:41   Dg Chest 2 View  08/03/2014   CLINICAL DATA:  Sickle cell pain crisis.  EXAM: CHEST  2 VIEW  COMPARISON:  07/20/2014  FINDINGS: Stable appearance of the left Port-A-Cath. Catheter tip in the lower SVC region. Prominent lung markings in the mid and lower lungs have not significantly changed. There is no significant airspace disease or pulmonary edema. Heart size remains enlarged. No evidence for pleural effusions.  IMPRESSION: Stable mild cardiomegaly without acute lung findings.   Electronically Signed   By: Markus Daft M.D.   On: 08/03/2014 08:49   Dg Ankle Complete Left  08/24/2014    CLINICAL DATA:  Sickle cell crisis with left lower leg and ankle pain and swelling.  EXAM: LEFT ANKLE COMPLETE - 3+ VIEW  COMPARISON:  12/21/2006  FINDINGS: Diffuse soft tissue swelling is seen surrounding the left ankle. No evidence of fracture, dislocation or bony destruction. The ankle mortise shows normal alignment. No bony lesions or visible bony infarcts by x-ray. No significant degenerative changes.  IMPRESSION: Soft tissue swelling surrounding left ankle. No underlying bony abnormalities are identified.   Electronically Signed   By: Aletta Edouard M.D.   On: 08/24/2014 08:35   Ir Fluoro Guide Cv Line Right  08/06/2014   CLINICAL DATA:  Sickle cell  EXAM: RIGHT UPPER EXTREMITY PICC LINE PLACEMENT WITH ULTRASOUND AND FLUOROSCOPIC GUIDANCE  FLUOROSCOPY TIME:  12 seconds  PROCEDURE: The patient was advised of the possible risks and complications and agreed to undergo the procedure. The patient was then brought to the angiographic suite for the procedure.  The right arm was prepped with chlorhexidine, draped in the usual sterile fashion using maximum barrier technique (cap and mask, sterile gown, sterile gloves, large sterile sheet, hand hygiene and cutaneous antisepsis) and infiltrated locally with 1% Lidocaine.  Ultrasound demonstrated patency of the right basilic vein, and this was documented with an image. Under real-time ultrasound guidance, this vein was accessed with a 21 gauge micropuncture needle and image documentation was performed. A 0.018 wire was introduced in to the vein. Over this, a 5 [place] double lumen power PICC was advanced to the lower SVC/right atrial junction. Fluoroscopy during the procedure and fluoro spot radiograph confirms appropriate catheter position. The catheter was flushed and covered with a sterile dressing.  COMPLICATIONS: None  LENGTH: 45 cm  IMPRESSION: Successful right arm power PICC line placement with ultrasound and fluoroscopic guidance. The catheter is ready for use.    Electronically Signed   By: Marybelle Killings M.D.   On: 08/06/2014 13:16   Ir US Guide Vasc Access Right  08/06/2014   CLINICAL DATA:  Sickle cell  EXAM: RIGHT UPPER EXTREMITY PICC LINE PLACEMENT WITH ULTRASOUND AND FLUOROSCOPIC GUIDANCE  FLUOROSCOPY TIME:  12 seconds  PROCEDURE: The patient was advised of the possible risks and complications and agreed to undergo the procedure. The patient was then brought to the angiographic suite for the procedure.  The right arm was prepped with chlorhexidine, draped in the usual sterile fashion using maximum barrier technique (cap and mask, sterile gown, sterile gloves, large sterile sheet, hand hygiene and cutaneous antisepsis) and infiltrated locally with 1% Lidocaine.  Ultrasound demonstrated patency of the right basilic vein, and this was documented with an image. Under real-time ultrasound guidance, this vein was accessed with  a 21 gauge micropuncture needle and image documentation was performed. A 0.018 wire was introduced in to the vein. Over this, a 5 [place] double lumen power PICC was advanced to the lower SVC/right atrial junction. Fluoroscopy during the procedure and fluoro spot radiograph confirms appropriate catheter position. The catheter was flushed and covered with a sterile dressing.  COMPLICATIONS: None  LENGTH: 45 cm  IMPRESSION: Successful right arm power PICC line placement with ultrasound and fluoroscopic guidance. The catheter is ready for use.   Electronically Signed   By: Marybelle Killings M.D.   On: 08/06/2014 13:16   Dg Chest Port 1 View  08/05/2014   CLINICAL DATA:  Hypoxia.  Sickle cell pain crisis  EXAM: PORTABLE CHEST - 1 VIEW  COMPARISON:  08/03/2014  FINDINGS: Chronic cardiomegaly. Aortic and hilar contours are stable and negative. Stable left subclavian porta catheter, tip at the upper right atrium.  Chronic reticulation of lung markings consistent with scarring. There is no edema, consolidation, effusion, or pneumothorax.  No acute osseous  findings.  IMPRESSION: Stable exam.  No evidence of acute cardiopulmonary disease.   Electronically Signed   By: Monte Fantasia M.D.   On: 08/05/2014 06:08     ECHO:   - Left ventricle: The cavity size was normal. Wall thickness was increased in a pattern of mild LVH. Systolic function was normal. The estimated ejection fraction was in the range of 55% to 65%. Wall motion was normal; there were no regional wall motion abnormalities. Left ventricular diastolic function parameters were normal. - Aortic valve: There was trivial regurgitation. - Right ventricle: The cavity size was severely dilated. Systolic function was mildly reduced. - Right atrium: The atrium was severely dilated. - Tricuspid valve: There was moderate-severe regurgitation. - Pulmonary arteries: PA peak pressure: 59 mm Hg (S).    No results found for this or any previous visit (from the past 240 hour(s)).  BRIEF ADMITTING H & P: [name] is a 34yo male with sickle cell disease who presented with complaints of leg pain that started about 2 days. He reports that he began have pain in his left foot and the following morning he found it to be remarkably swollen. Nothing seemed to help his pain. He tried his home pain meds with no relief. He also complains of bilateral rib pain. He denies any cough or SOB. He is intermittently using his home oxygen at 3 L. He vomited once yesterday. Emesis was yellow/brown in color. No emesis since then. Denies diarrhea, last bowel movement was 2 days ago. He states that he has been taking his Lovenox and can not recall missing any doses.   In the ED, he was hypoxic down to 90% o2 sat. He was placed on 4LPM via nasal cannula. A doppler for his leg was ordered but not yet performed.    Hospital Course:  Present on Admission:  . Cor pulmonale, chronic with Pedal Edema: Pt presented with pedal edema which is suspected to be secondary to known cor-pulmonale. The pedal edema  resolved with elevation of legs and re-occurred during the day as patient ambulated. Ted hose were ordered for the patient and I have recommended that he wear them during the day while on his feet. A 2-D ECHO was performed which showed no significant changes from the previous ECHO which was done last month.  Marland Kitchen Hb-SS disease with crisis: Pt was treated with IV Dilaudid via PCA and then transitioned to oral Dilaudid. He was continued on MS Contin  and treated with IV Toradol additionally. At the time of discharge he was ambulatory without any difficulty and pain was rated at 5-6/10 which is manageable with oral analgesics.  . Essential hypertension: BP well controlled and patient is continued on Cardizem and Metoprolol.  . Pulmonary HTN with intermittent hypoxia: Pt has known Cor-Pulmonale with right sided heart failure. He has had intermittent daytime hypoxemia and consistent nocturnal hypoxemia with sleep. During this hospitalization his oxygen saturation levels were found to be above 90% with ambulation on RA. He has been using Oxygen intermittently at home. I have advised him that based upon the saturations from this admission he does not require oxygen during the day but he should continue to use ut at night.  Marland Kitchen Hx of pulmonary embolus: Pt has a history of recurrent PE' s and is chronically on Lovenox as he failed therapy with Xarelto.   . Anemia of chronic disease: Pt has known anemia of chronic disease. His Hb is currently at baseline and required no transfusions this admission.  Disposition and Follow-up:  Pt discharged home in stable condition. He is due for follow up with Dr. Felisa Bonier at [facility] this month and has been advised to make an appointment. He is to follow uo in the [facility] in 1-2 weeks.      Discharge Instructions    Activity as tolerated - No restrictions    Complete by:  As directed            DISCHARGE EXAM:  General: Alert awake, oriented x3, in mild distress.   Vital Signs: BP 128/61, HR 89, T 97.8 F (36.6 C), temperature source Oral, RR 17, height 6' (1.829 m), weight 163 lb 2.3 oz (74 kg), SpO2 92 %. HEENT: [place]/AT PEERL, EOMI, anicteric Neck: Trachea midline, no masses, no thyromegal,y no JVD, no carotid bruit OROPHARYNX: Moist, No exudate/ erythema/lesions.  Heart: Regular rate and rhythm, without murmurs, rubs, gallops or S3. PMI non-displaced. Exam reveals no decreased pulses. Pulmonary/Chest: Normal effort. Breath sounds normal. No. Apnea. Clear to auscultation,no stridor,  no wheezing and no rhonchi noted. No respiratory distress and no tenderness noted. Abdomen: Soft, nontender, nondistended, normal bowel sounds, no masses no hepatosplenomegaly noted. No fluid wave and no ascites. There is no guarding or rebound. Neuro: Alert and oriented to person, place and time. Normal motor skills, Displays no atrophy or tremors and exhibits normal muscle tone.  No focal neurological deficits noted cranial nerves II through XII grossly intact. No sensory deficit noted.  Strength at baseline in bilateral upper and lower extremities. Gait normal. Musculoskeletal: No warm swelling or erythema around joints, no spinal tenderness noted. Psychiatric: Patient alert and oriented x3, good insight and cognition, good recent to remote recall. Lymph node survey: No cervical axillary or inguinal lymphadenopathy noted. Skin: Skin is warm and dry. No bruising, no ecchymosis and no rash noted. Pt is not diaphoretic. No erythema. No pallor Psychiatric: Mood, memory, affect and judgement normal     Recent Labs  08/25/14 0515 08/26/14 0525  NA 139 140  K 4.1 3.9  CL 113* 112*  CO2 22 20*  GLUCOSE 101* 114*  BUN 18 15  CREATININE 0.62 0.56*  CALCIUM 8.6* 8.7*   No results for input(s): AST, ALT, ALKPHOS, BILITOT, PROT, ALBUMIN in the last 72 hours. No results for input(s): LIPASE, AMYLASE in the last 72 hours.  Recent Labs  08/26/14 0525 08/27/14 0612   WBC 13.1* 13.2*  NEUTROABS 8.4* 7.8*  HGB 6.3* 6.1*  HCT 18.7* 18.0*  MCV 89.0 87.4  PLT 366 356     Total time spent including face to face and decision making was greater than 30 minutes  Signed: Orlinda Slomski A. 08/27/2014, 2:12 PM

## 2014-08-29 ENCOUNTER — Inpatient Hospital Stay (HOSPITAL_COMMUNITY)
Admission: EM | Admit: 2014-08-29 | Discharge: 2014-09-03 | DRG: 811 | Disposition: A | Discharge status: Home / Self Care | Payer: Medicare Other | Attending: Internal Medicine | Admitting: Internal Medicine

## 2014-08-29 ENCOUNTER — Emergency Department (HOSPITAL_COMMUNITY): Payer: Medicare Other

## 2014-08-29 ENCOUNTER — Encounter (HOSPITAL_COMMUNITY): Payer: Self-pay

## 2014-08-29 ENCOUNTER — Inpatient Hospital Stay (HOSPITAL_COMMUNITY): Payer: Medicare Other

## 2014-08-29 DIAGNOSIS — J9691 Respiratory failure, unspecified with hypoxia: Secondary | ICD-10-CM | POA: Diagnosis not present

## 2014-08-29 DIAGNOSIS — Z79899 Other long term (current) drug therapy: Secondary | ICD-10-CM | POA: Diagnosis not present

## 2014-08-29 DIAGNOSIS — E860 Dehydration: Secondary | ICD-10-CM | POA: Diagnosis present

## 2014-08-29 DIAGNOSIS — R0602 Shortness of breath: Secondary | ICD-10-CM

## 2014-08-29 DIAGNOSIS — L03012 Cellulitis of left finger: Secondary | ICD-10-CM | POA: Diagnosis not present

## 2014-08-29 DIAGNOSIS — D5700 Hb-SS disease with crisis, unspecified: Principal | ICD-10-CM | POA: Diagnosis present

## 2014-08-29 DIAGNOSIS — I5022 Chronic systolic (congestive) heart failure: Secondary | ICD-10-CM | POA: Diagnosis present

## 2014-08-29 DIAGNOSIS — Z87891 Personal history of nicotine dependence: Secondary | ICD-10-CM

## 2014-08-29 DIAGNOSIS — R7989 Other specified abnormal findings of blood chemistry: Secondary | ICD-10-CM

## 2014-08-29 DIAGNOSIS — Z9114 Patient's other noncompliance with medication regimen: Secondary | ICD-10-CM | POA: Diagnosis present

## 2014-08-29 DIAGNOSIS — R7881 Bacteremia: Secondary | ICD-10-CM | POA: Diagnosis not present

## 2014-08-29 DIAGNOSIS — D72829 Elevated white blood cell count, unspecified: Secondary | ICD-10-CM | POA: Diagnosis present

## 2014-08-29 DIAGNOSIS — I2781 Cor pulmonale (chronic): Secondary | ICD-10-CM | POA: Diagnosis not present

## 2014-08-29 DIAGNOSIS — I517 Cardiomegaly: Secondary | ICD-10-CM | POA: Diagnosis not present

## 2014-08-29 DIAGNOSIS — I10 Essential (primary) hypertension: Secondary | ICD-10-CM | POA: Diagnosis present

## 2014-08-29 DIAGNOSIS — Z833 Family history of diabetes mellitus: Secondary | ICD-10-CM

## 2014-08-29 DIAGNOSIS — E876 Hypokalemia: Secondary | ICD-10-CM | POA: Diagnosis present

## 2014-08-29 DIAGNOSIS — G8929 Other chronic pain: Secondary | ICD-10-CM | POA: Diagnosis present

## 2014-08-29 DIAGNOSIS — R079 Chest pain, unspecified: Secondary | ICD-10-CM | POA: Diagnosis not present

## 2014-08-29 DIAGNOSIS — Z96641 Presence of right artificial hip joint: Secondary | ICD-10-CM | POA: Diagnosis present

## 2014-08-29 DIAGNOSIS — R0902 Hypoxemia: Secondary | ICD-10-CM | POA: Diagnosis not present

## 2014-08-29 DIAGNOSIS — I4891 Unspecified atrial fibrillation: Secondary | ICD-10-CM | POA: Diagnosis present

## 2014-08-29 DIAGNOSIS — L03032 Cellulitis of left toe: Secondary | ICD-10-CM | POA: Diagnosis not present

## 2014-08-29 DIAGNOSIS — J9611 Chronic respiratory failure with hypoxia: Secondary | ICD-10-CM | POA: Diagnosis present

## 2014-08-29 DIAGNOSIS — I2699 Other pulmonary embolism without acute cor pulmonale: Secondary | ICD-10-CM | POA: Diagnosis not present

## 2014-08-29 DIAGNOSIS — I272 Other secondary pulmonary hypertension: Secondary | ICD-10-CM | POA: Diagnosis present

## 2014-08-29 DIAGNOSIS — I7789 Other specified disorders of arteries and arterioles: Secondary | ICD-10-CM | POA: Diagnosis not present

## 2014-08-29 DIAGNOSIS — Z7901 Long term (current) use of anticoagulants: Secondary | ICD-10-CM | POA: Diagnosis not present

## 2014-08-29 DIAGNOSIS — Z79891 Long term (current) use of opiate analgesic: Secondary | ICD-10-CM | POA: Diagnosis not present

## 2014-08-29 DIAGNOSIS — D571 Sickle-cell disease without crisis: Secondary | ICD-10-CM | POA: Diagnosis not present

## 2014-08-29 DIAGNOSIS — I493 Ventricular premature depolarization: Secondary | ICD-10-CM | POA: Diagnosis present

## 2014-08-29 DIAGNOSIS — F39 Unspecified mood [affective] disorder: Secondary | ICD-10-CM | POA: Diagnosis present

## 2014-08-29 DIAGNOSIS — D638 Anemia in other chronic diseases classified elsewhere: Secondary | ICD-10-CM | POA: Diagnosis present

## 2014-08-29 DIAGNOSIS — Z86711 Personal history of pulmonary embolism: Secondary | ICD-10-CM

## 2014-08-29 DIAGNOSIS — B9689 Other specified bacterial agents as the cause of diseases classified elsewhere: Secondary | ICD-10-CM | POA: Diagnosis not present

## 2014-08-29 LAB — BRAIN NATRIURETIC PEPTIDE: B Natriuretic Peptide: 1117 pg/mL — ABNORMAL HIGH (ref 0.0–100.0)

## 2014-08-29 LAB — COMPREHENSIVE METABOLIC PANEL
ALT: 35 U/L (ref 17–63)
ANION GAP: 10 (ref 5–15)
AST: 45 U/L — AB (ref 15–41)
Albumin: 4.6 g/dL (ref 3.5–5.0)
Alkaline Phosphatase: 99 U/L (ref 38–126)
BUN: <5 mg/dL — ABNORMAL LOW (ref 6–20)
CHLORIDE: 106 mmol/L (ref 101–111)
CO2: 22 mmol/L (ref 22–32)
CREATININE: 0.53 mg/dL — AB (ref 0.61–1.24)
Calcium: 9.6 mg/dL (ref 8.9–10.3)
GFR calc non Af Amer: >60 mL/min (ref >60)
GLUCOSE: 131 mg/dL — AB (ref 65–99)
Potassium: 3.3 mmol/L — ABNORMAL LOW (ref 3.5–5.1)
Sodium: 138 mmol/L (ref 135–145)
Total Bilirubin: 8.7 mg/dL — ABNORMAL HIGH (ref 0.3–1.2)
Total Protein: 8.9 g/dL — ABNORMAL HIGH (ref 6.5–8.1)

## 2014-08-29 LAB — URINALYSIS, ROUTINE W REFLEX MICROSCOPIC
GLUCOSE, UA: NEGATIVE mg/dL
KETONES UR: NEGATIVE mg/dL
LEUKOCYTES UA: NEGATIVE
Nitrite: NEGATIVE
PH: 7 (ref 5.0–8.0)
Specific Gravity, Urine: >1.046 — ABNORMAL HIGH (ref 1.005–1.030)
UROBILINOGEN UA: 4 mg/dL — AB (ref 0.0–1.0)

## 2014-08-29 LAB — CBC WITH DIFFERENTIAL/PLATELET
BASOS ABS: 0.2 10*3/uL — AB (ref 0.0–0.1)
BASOS PCT: 0 % (ref 0–1)
Basophils Absolute: 0 10*3/uL (ref 0.0–0.1)
Basophils Relative: 1 % (ref 0–1)
EOS PCT: 1 % (ref 0–5)
Eosinophils Absolute: 0.2 10*3/uL (ref 0.0–0.7)
Eosinophils Absolute: 0 10*3/uL (ref 0.0–0.7)
Eosinophils Relative: 0 % (ref 0–5)
HCT: 27 % — ABNORMAL LOW (ref 39.0–52.0)
HCT: 23.6 % — ABNORMAL LOW (ref 39.0–52.0)
HEMOGLOBIN: 9.3 g/dL — AB (ref 13.0–17.0)
HEMOGLOBIN: 8 g/dL — AB (ref 13.0–17.0)
LYMPHS ABS: 1.9 10*3/uL (ref 0.7–4.0)
Lymphocytes Relative: 10 % — ABNORMAL LOW (ref 12–46)
Lymphocytes Relative: 11 % — ABNORMAL LOW (ref 12–46)
Lymphs Abs: 2.3 10*3/uL (ref 0.7–4.0)
MCH: 31.3 pg (ref 26.0–34.0)
MCH: 31.3 pg (ref 26.0–34.0)
MCHC: 34.4 g/dL (ref 30.0–36.0)
MCHC: 33.9 g/dL (ref 30.0–36.0)
MCV: 90.9 fL (ref 78.0–100.0)
MCV: 92.2 fL (ref 78.0–100.0)
MONOS PCT: 14 % — AB (ref 3–12)
Monocytes Absolute: 2.1 10*3/uL — ABNORMAL HIGH (ref 0.1–1.0)
Monocytes Absolute: 3 10*3/uL — ABNORMAL HIGH (ref 0.1–1.0)
Monocytes Relative: 11 % (ref 3–12)
NEUTROS ABS: 14.6 10*3/uL — AB (ref 1.7–7.7)
NEUTROS ABS: 16 10*3/uL — AB (ref 1.7–7.7)
NEUTROS PCT: 75 % (ref 43–77)
NRBC: 19 /100{WBCs} — AB
NRBC: 22 /100{WBCs} — AB
Neutrophils Relative %: 77 % (ref 43–77)
PLATELETS: 468 10*3/uL — AB (ref 150–400)
Platelets: 456 10*3/uL — ABNORMAL HIGH (ref 150–400)
RBC: 2.97 MIL/uL — ABNORMAL LOW (ref 4.22–5.81)
RBC: 2.56 MIL/uL — ABNORMAL LOW (ref 4.22–5.81)
RDW: 22.4 % — AB (ref 11.5–15.5)
RDW: 21.4 % — ABNORMAL HIGH (ref 11.5–15.5)
WBC: 19 10*3/uL — AB (ref 4.0–10.5)
WBC: 21.3 10*3/uL — ABNORMAL HIGH (ref 4.0–10.5)

## 2014-08-29 LAB — I-STAT TROPONIN, ED: TROPONIN I, POC: 0.42 ng/mL — AB (ref 0.00–0.08)

## 2014-08-29 LAB — TROPONIN I
Troponin I: 0.5 ng/mL (ref <0.031)
Troponin I: 1.01 ng/mL (ref <0.031)

## 2014-08-29 LAB — BLOOD GAS, ARTERIAL
Acid-base deficit: 4.2 mmol/L — ABNORMAL HIGH (ref 0.0–2.0)
Bicarbonate: 19.8 mEq/L — ABNORMAL LOW (ref 20.0–24.0)
DRAWN BY: 41308
FIO2: 0.55
O2 Saturation: 96.3 %
PCO2 ART: 34.1 mmHg — AB (ref 35.0–45.0)
PH ART: 7.382 (ref 7.350–7.450)
PO2 ART: 98.1 mmHg (ref 80.0–100.0)
Patient temperature: 98.6
TCO2: 18.9 mmol/L (ref 0–100)

## 2014-08-29 LAB — I-STAT CG4 LACTIC ACID, ED: Lactic Acid, Venous: 0.91 mmol/L (ref 0.5–2.0)

## 2014-08-29 LAB — RETICULOCYTES
RBC.: 2.97 MIL/uL — AB (ref 4.22–5.81)
Retic Ct Pct: >23 % — ABNORMAL HIGH (ref 0.4–3.1)

## 2014-08-29 LAB — URINE MICROSCOPIC-ADD ON

## 2014-08-29 LAB — HEMOGLOBIN AND HEMATOCRIT, BLOOD
HCT: 20.8 % — ABNORMAL LOW (ref 39.0–52.0)
HEMOGLOBIN: 6.9 g/dL — AB (ref 13.0–17.0)

## 2014-08-29 LAB — MAGNESIUM: Magnesium: 1.6 mg/dL — ABNORMAL LOW (ref 1.7–2.4)

## 2014-08-29 LAB — MRSA PCR SCREENING: MRSA BY PCR: NEGATIVE

## 2014-08-29 LAB — APTT: aPTT: 30 seconds (ref 24–37)

## 2014-08-29 LAB — PREPARE RBC (CROSSMATCH)

## 2014-08-29 MED ORDER — DILTIAZEM HCL 100 MG IV SOLR
5.0000 mg/h | Freq: Once | INTRAVENOUS | Status: AC
  Administered 2014-08-29: 5 mg/h via INTRAVENOUS
  Filled 2014-08-29: qty 100

## 2014-08-29 MED ORDER — SODIUM CHLORIDE 0.9 % IV SOLN
1000.0000 mL | Freq: Once | INTRAVENOUS | Status: AC
  Administered 2014-08-29: 1000 mL via INTRAVENOUS

## 2014-08-29 MED ORDER — DILTIAZEM HCL 25 MG/5ML IV SOLN
20.0000 mg | Freq: Once | INTRAVENOUS | Status: AC
  Administered 2014-08-29: 20 mg via INTRAVENOUS
  Filled 2014-08-29: qty 5

## 2014-08-29 MED ORDER — SODIUM CHLORIDE 0.9 % IV SOLN
Freq: Once | INTRAVENOUS | Status: AC
  Administered 2014-08-29: 15:00:00 via INTRAVENOUS

## 2014-08-29 MED ORDER — SODIUM CHLORIDE 0.9 % IV SOLN
10.0000 mL/h | Freq: Once | INTRAVENOUS | Status: AC
  Administered 2014-08-29: 10 mL/h via INTRAVENOUS

## 2014-08-29 MED ORDER — KETOROLAC TROMETHAMINE 30 MG/ML IJ SOLN
30.0000 mg | Freq: Once | INTRAMUSCULAR | Status: AC
  Administered 2014-08-29: 30 mg via INTRAVENOUS
  Filled 2014-08-29: qty 1

## 2014-08-29 MED ORDER — VANCOMYCIN HCL IN DEXTROSE 1-5 GM/200ML-% IV SOLN
1000.0000 mg | Freq: Three times a day (TID) | INTRAVENOUS | Status: DC
  Administered 2014-08-29 – 2014-09-03 (×14): 1000 mg via INTRAVENOUS
  Filled 2014-08-29 (×15): qty 200

## 2014-08-29 MED ORDER — ZOLPIDEM TARTRATE 10 MG PO TABS
10.0000 mg | ORAL_TABLET | Freq: Every evening | ORAL | Status: DC | PRN
  Administered 2014-08-29 – 2014-09-03 (×4): 10 mg via ORAL
  Filled 2014-08-29 (×4): qty 1

## 2014-08-29 MED ORDER — DILTIAZEM HCL 100 MG IV SOLR: 5.0000 mg/h | INTRAVENOUS | Status: DC

## 2014-08-29 MED ORDER — HYDROMORPHONE HCL 2 MG/ML IJ SOLN
2.0000 mg | INTRAMUSCULAR | Status: DC | PRN
  Administered 2014-08-29: 2 mg via INTRAVENOUS
  Filled 2014-08-29: qty 1

## 2014-08-29 MED ORDER — SODIUM CHLORIDE 0.9 % IV SOLN
25.0000 mg | INTRAVENOUS | Status: DC | PRN
  Administered 2014-08-29 – 2014-09-03 (×12): 25 mg via INTRAVENOUS
  Filled 2014-08-29 (×25): qty 0.5

## 2014-08-29 MED ORDER — SENNOSIDES-DOCUSATE SODIUM 8.6-50 MG PO TABS
1.0000 | ORAL_TABLET | Freq: Two times a day (BID) | ORAL | Status: DC
  Administered 2014-08-29 – 2014-09-03 (×6): 1 via ORAL
  Filled 2014-08-29 (×9): qty 1

## 2014-08-29 MED ORDER — POTASSIUM CHLORIDE IN NACL 40-0.9 MEQ/L-% IV SOLN
INTRAVENOUS | Status: DC
  Administered 2014-08-29: 100 mL/h via INTRAVENOUS
  Administered 2014-08-30 – 2014-09-01 (×3): 50 mL/h via INTRAVENOUS
  Filled 2014-08-29 (×9): qty 1000

## 2014-08-29 MED ORDER — PIPERACILLIN-TAZOBACTAM 3.375 G IVPB
3.3750 g | Freq: Three times a day (TID) | INTRAVENOUS | Status: DC
  Administered 2014-08-30 – 2014-09-03 (×14): 3.375 g via INTRAVENOUS
  Filled 2014-08-29 (×16): qty 50

## 2014-08-29 MED ORDER — HYDROMORPHONE 2 MG/ML HIGH CONCENTRATION IV PCA SOLN
INTRAVENOUS | Status: DC
  Administered 2014-08-29: 17.7 mg via INTRAVENOUS
  Administered 2014-08-29: 4.9 mg via INTRAVENOUS
  Administered 2014-08-30: 2.8 mg via INTRAVENOUS
  Administered 2014-08-30: 9.8 mg via INTRAVENOUS
  Administered 2014-08-30: 1.4 mg via INTRAVENOUS
  Administered 2014-08-30: 18.9 mg via INTRAVENOUS
  Administered 2014-08-30: 15:00:00 via INTRAVENOUS
  Administered 2014-08-31 (×2): 7.7 mg via INTRAVENOUS
  Administered 2014-08-31: 9.1 mg via INTRAVENOUS
  Administered 2014-08-31: 1.4 mg via INTRAVENOUS
  Administered 2014-08-31: 17:00:00 via INTRAVENOUS
  Administered 2014-08-31: 2.8 mg via INTRAVENOUS
  Administered 2014-08-31 – 2014-09-01 (×2): 9.8 mg via INTRAVENOUS
  Administered 2014-09-01: 3.4 mg via INTRAVENOUS
  Administered 2014-09-01: 15:00:00 via INTRAVENOUS
  Administered 2014-09-01 (×2): 10.5 mg via INTRAVENOUS
  Administered 2014-09-01: 12.5 mg via INTRAVENOUS
  Administered 2014-09-01: 11.1 mg via INTRAVENOUS
  Administered 2014-09-01: 4.9 mg via INTRAVENOUS
  Administered 2014-09-02: 12:00:00 via INTRAVENOUS
  Administered 2014-09-02: 12.5 mg via INTRAVENOUS
  Administered 2014-09-02: 3.5 mg via INTRAVENOUS
  Administered 2014-09-02: 11.2 mg via INTRAVENOUS
  Administered 2014-09-02: 11.9 mg via INTRAVENOUS
  Administered 2014-09-02: 6.3 mg via INTRAVENOUS
  Administered 2014-09-03: 0.7 mg via INTRAVENOUS
  Administered 2014-09-03: 7 mg via INTRAVENOUS
  Administered 2014-09-03: 4.2 mg via INTRAVENOUS
  Administered 2014-09-03: 10.5 mg via INTRAVENOUS
  Administered 2014-09-03: 11.9 mg via INTRAVENOUS
  Filled 2014-08-29 (×7): qty 25

## 2014-08-29 MED ORDER — MORPHINE SULFATE ER 30 MG PO TBCR
30.0000 mg | EXTENDED_RELEASE_TABLET | Freq: Two times a day (BID) | ORAL | Status: DC
  Administered 2014-08-29 – 2014-09-03 (×10): 30 mg via ORAL
  Filled 2014-08-29 (×10): qty 1

## 2014-08-29 MED ORDER — DILTIAZEM HCL 30 MG PO TABS
30.0000 mg | ORAL_TABLET | Freq: Four times a day (QID) | ORAL | Status: DC
  Administered 2014-08-29 – 2014-09-01 (×11): 30 mg via ORAL
  Filled 2014-08-29 (×11): qty 1

## 2014-08-29 MED ORDER — ASPIRIN 81 MG PO CHEW
81.0000 mg | CHEWABLE_TABLET | Freq: Every day | ORAL | Status: DC
  Administered 2014-08-29 – 2014-09-03 (×6): 81 mg via ORAL
  Filled 2014-08-29 (×6): qty 1

## 2014-08-29 MED ORDER — ONDANSETRON HCL 4 MG/2ML IJ SOLN: 4.0000 mg | Freq: Four times a day (QID) | INTRAMUSCULAR | Status: DC | PRN

## 2014-08-29 MED ORDER — NALOXONE HCL 0.4 MG/ML IJ SOLN: 0.4000 mg | INTRAMUSCULAR | Status: DC | PRN

## 2014-08-29 MED ORDER — ACETAMINOPHEN 325 MG PO TABS: 650.0000 mg | ORAL_TABLET | ORAL | Status: DC | PRN

## 2014-08-29 MED ORDER — ONDANSETRON HCL 4 MG/2ML IJ SOLN
4.0000 mg | Freq: Once | INTRAMUSCULAR | Status: AC
  Administered 2014-08-29: 4 mg via INTRAVENOUS
  Filled 2014-08-29: qty 2

## 2014-08-29 MED ORDER — HYDROMORPHONE HCL 2 MG/ML IJ SOLN
2.0000 mg | Freq: Once | INTRAMUSCULAR | Status: AC
  Administered 2014-08-29: 2 mg via INTRAVENOUS
  Filled 2014-08-29: qty 1

## 2014-08-29 MED ORDER — MAGNESIUM SULFATE 2 GM/50ML IV SOLN
2.0000 g | Freq: Once | INTRAVENOUS | Status: AC
  Administered 2014-08-29: 2 g via INTRAVENOUS
  Filled 2014-08-29: qty 50

## 2014-08-29 MED ORDER — DIPHENHYDRAMINE HCL 25 MG PO CAPS
25.0000 mg | ORAL_CAPSULE | Freq: Four times a day (QID) | ORAL | Status: DC | PRN
  Filled 2014-08-29 (×4): qty 1

## 2014-08-29 MED ORDER — FOLIC ACID 1 MG PO TABS
1.0000 mg | ORAL_TABLET | Freq: Every morning | ORAL | Status: DC
  Administered 2014-08-30 – 2014-09-03 (×5): 1 mg via ORAL
  Filled 2014-08-29 (×5): qty 1

## 2014-08-29 MED ORDER — POLYETHYLENE GLYCOL 3350 17 G PO PACK: 17.0000 g | PACK | Freq: Every day | ORAL | Status: DC | PRN

## 2014-08-29 MED ORDER — IOHEXOL 350 MG/ML SOLN
100.0000 mL | Freq: Once | INTRAVENOUS | Status: AC | PRN
  Administered 2014-08-29: 100 mL via INTRAVENOUS

## 2014-08-29 MED ORDER — METOPROLOL TARTRATE 25 MG PO TABS
25.0000 mg | ORAL_TABLET | Freq: Every day | ORAL | Status: DC
  Administered 2014-08-29 – 2014-09-03 (×6): 25 mg via ORAL
  Filled 2014-08-29 (×6): qty 1

## 2014-08-29 MED ORDER — DIPHENHYDRAMINE HCL 25 MG PO CAPS
25.0000 mg | ORAL_CAPSULE | Freq: Once | ORAL | Status: AC
  Administered 2014-08-29: 25 mg via ORAL
  Filled 2014-08-29: qty 1

## 2014-08-29 MED ORDER — SODIUM CHLORIDE 0.9 % IJ SOLN
9.0000 mL | INTRAMUSCULAR | Status: DC | PRN
Start: 2014-08-29 — End: 2014-09-03

## 2014-08-29 MED ORDER — ENOXAPARIN SODIUM 120 MG/0.8ML ~~LOC~~ SOLN
110.0000 mg | SUBCUTANEOUS | Status: DC
  Administered 2014-08-30 – 2014-09-03 (×5): 110 mg via SUBCUTANEOUS
  Filled 2014-08-29 (×6): qty 0.8

## 2014-08-29 MED ORDER — HYDROMORPHONE HCL 2 MG/ML IJ SOLN
2.0000 mg | INTRAMUSCULAR | Status: AC | PRN
  Administered 2014-08-29 (×2): 2 mg via INTRAVENOUS
  Filled 2014-08-29 (×2): qty 1

## 2014-08-29 MED ORDER — POTASSIUM CHLORIDE CRYS ER 20 MEQ PO TBCR
20.0000 meq | EXTENDED_RELEASE_TABLET | Freq: Every morning | ORAL | Status: DC
  Administered 2014-08-29 – 2014-09-03 (×6): 20 meq via ORAL
  Filled 2014-08-29 (×6): qty 1

## 2014-08-29 MED ORDER — HYDROMORPHONE 2 MG/ML HIGH CONCENTRATION IV PCA SOLN
INTRAVENOUS | Status: DC
  Administered 2014-08-29: 12:00:00 via INTRAVENOUS
  Filled 2014-08-29: qty 25

## 2014-08-29 MED ORDER — FUROSEMIDE 10 MG/ML IJ SOLN
20.0000 mg | Freq: Once | INTRAMUSCULAR | Status: AC
  Administered 2014-08-29: 20 mg via INTRAVENOUS
  Filled 2014-08-29: qty 2

## 2014-08-29 NOTE — ED Notes (Signed)
MD at bedside.  Pt placed on oxygen at 3l per [place].

## 2014-08-29 NOTE — Progress Notes (Signed)
RT called to bedside by RN. Patient's oxygen saturation was 70% while on 6 L nasal cannula. RT placed a Venturi mask at 14 L and 55% on patient. O2 saturation increased to 94-95% while on venturi mask. Vitals remained stable throughout while the O2 saturation increased. Patient was restless, but did not express any distress. RT will continue to monitor patient.

## 2014-08-29 NOTE — Progress Notes (Signed)
CRITICAL VALUE ALERT  Critical value received:  Hgb 6.9  Date of notification:  08/29/14  Time of notification:  6503  Critical value read back: yes  Nurse who received alert:  Mannie Stabile RN  MD notified (1st page): MD notified on floor.  Time of first page:   MD notified (2nd page):  Time of second page:  Responding MD:  Dr. Stann Mainland  Time MD responded:  MD on floor

## 2014-08-29 NOTE — Progress Notes (Signed)
Wasn't notified by Dr. Zigmund Daniel that have patient appear to be hypoxic. History has been reviewed. Patient apparently had a recent admission for sickle cell crisis and was discharged 2 days ago presented back from home with worsening leg pain back pain and rib pain. Patient was noted to be hypoxic at that time requiring up to 3 L of oxygen CT angiogram was done showing no acute findings he has history of prior PE but no new PE. Patient was complaining of some chest pain at that time. EKG showed atrial fibrillation with rapid response.  Patient was started on diltiazem drip and admitted to stepdown where he was able to be converted on by mouth diltiazem and metoprolol. He was dosed for therapeutic Lovenox. Patient was started on high-dose PCA. Patient fought to be hemoconcentrated and was given 2 L of normal saline followed by one pack of red blood cells to help with hypoxia.  Around 6 PM patient was noted to be significantly hypoxic requiring 6 L of nasal cannula and then going off to 55% oxygen by Venturi mask Dr. Zigmund Daniel was notified and gave order to administer 20 IV Lasix. After which patient urinated about 250 mL of dark urine On my arrival to the floor patient was alert and oriented in no distress he took his mask off to eat his lunch. At that time he was satting 76% on room air. He was instructed to put his mask back on. Chest x-ray was done which did not show evidence of pulmonary edema although significant cardiomegaly was noted unchanged from prior. Of note patient has known history of pulmonary hypertension.  ABG was ordered showing pH is 7.382, PCO2 34.1, PO2 of 98.1 this was on 55% of venturi mask Patient remains stable and does not appear to be oversedated. Heart rate down to 90s. On exam lungs clear without evidence of crackles. Lower extremities without evidence of edema patient appears to be alert and oriented abdomen soft nontender Case was discussed with e-link was aware of the  patient. Unclear cause of hypoxia at this time. Possibly related to sickling. Patient was noted to have elevated troponin ordered troponin to be cycled noted rising troponin up to 1.0 patient is already on Lovenox Will obtain echogram in the morning. Case was discussed with cardiac fellow on-call who agrees this is likely non-ACS. Elevated troponin could be secondary to significant hypoxia, in the setting of A. fib with RVR and possible some demand mismatch.  We'll continue to cycle in case echogram in the morning shows any evidence of wall motion abnormality we'll need to consult formally cardiology. Possibility of acute chest given persistent hypoxia rising white blood cell count  although somewhat atypical chest x-ray findings For now will cover with antibiotics. Obtain blood cultures   A total of 90 minutes was spent on this patient contact extra time was taken to discuss case with [facility] M, lab, cardiology  [place] 11:07 PM

## 2014-08-29 NOTE — ED Notes (Signed)
MD at bedside. 

## 2014-08-29 NOTE — ED Provider Notes (Signed)
CSN: [id number]     Arrival date & time 08/29/14  4132 History   First MD Initiated Contact with Patient 08/29/14 [phone]     Chief Complaint  Patient presents with  . Sickle Cell Pain Crisis  . Chest Pain     (Consider location/radiation/quality/duration/timing/severity/associated sxs/prior Treatment) Patient is a 34 y.o. male presenting with sickle cell pain.  Sickle Cell Pain Crisis Location:  Chest and lower extremity Severity:  Severe Onset quality:  Gradual Duration: was admitted and discharged 2 days ago, worsening pain over last 2 days. Similar to previous crisis episodes: yes   Timing:  Constant Progression:  Worsening Chronicity:  New Associated symptoms: chest pain   Associated symptoms: no fever, no headaches, no shortness of breath and no sore throat     Past Medical History  Diagnosis Date  . Sickle cell anemia   . Blood transfusion   . Acute embolism and thrombosis of right internal jugular vein   . Hypokalemia   . Mood disorder   . History of pulmonary embolus (PE)   . Avascular necrosis   . Leukocytosis     Chronic  . Thrombocytosis     Chronic  . Hypertension   . History of Clostridium difficile infection   . Uses marijuana   . Chronic anticoagulation   . Functional asplenia   . Former smoker   . Second hand tobacco smoke exposure   . Alcohol consumption of one to four drinks per day   . Noncompliance with medication regimen   . Sickle-cell crisis with associated acute chest syndrome 05/13/2013  . Acute chest syndrome 06/18/2013  . Demand ischemia 01/02/2014   Past Surgical History  Procedure Laterality Date  . Right hip replacement      08/2006  . Cholecystectomy      01/2008  . Porta cath placement    . Porta cath removal    . Umbilical hernia repair      01/2008  . Excision of left periauricular cyst      10/2009  . Excision of right ear lobe cyst with primary closur      11/2007  . Portacath placement  01/05/2012    Procedure: INSERTION  PORT-A-CATH;  Surgeon: Odis Hollingshead, MD;  Location: [place];  Service: General;  Laterality: N/A;  ultrasound guiced port a cath insertion with fluoroscopy   Family History  Problem Relation Age of Onset  . Sickle cell trait Mother   . Depression Mother   . Diabetes Mother   . Sickle cell trait Father   . Sickle cell trait Brother    History  Substance Use Topics  . Smoking status: Former Smoker -- 13 years    Quit date: 07/08/2010  . Smokeless tobacco: Never Used  . Alcohol Use: No    Review of Systems  Constitutional: Negative for fever.  HENT: Negative for sore throat.   Eyes: Negative for visual disturbance.  Respiratory: Negative for shortness of breath.   Cardiovascular: Positive for chest pain.  Gastrointestinal: Negative for abdominal pain.  Genitourinary: Negative for difficulty urinating.  Musculoskeletal: Positive for myalgias and arthralgias. Negative for back pain and neck stiffness.  Skin: Negative for rash.  Neurological: Negative for syncope and headaches.      Allergies  Review of patient's allergies indicates no known allergies.  Home Medications   Prior to Admission medications   Medication Sig Start Date End Date Taking? Authorizing Provider  Cholecalciferol (VITAMIN D) 2000 UNITS tablet Take 1 tablet (  2,000 Units total) by mouth daily. 02/13/14  Yes [name], MD  diltiazem (CARDIZEM CD) 120 MG 24 hr capsule Take 1 capsule (120 mg total) by mouth daily. 03/13/14  Yes Costin Karlyne Greenspan, MD  enoxaparin (LOVENOX) 150 MG/ML injection Inject 0.73 mLs (110 mg total) into the skin daily. Starting tonight 07/23/2014 at 10:00pm Patient taking differently: Inject 110 mg into the skin daily.  08/12/14  Yes Micheline Chapman, NP  folic acid (FOLVITE) 1 MG tablet Take 1 tablet (1 mg total) by mouth every morning. 10/08/13  Yes [name], MD  HYDROmorphone (DILAUDID) 4 MG tablet Take 1 tablet (4 mg total) by mouth every 4 (four) hours as needed for  severe pain. 08/08/14  Yes Micheline Chapman, NP  hydroxyurea (HYDREA) 500 MG capsule Take 3 capsules (1,500 mg total) by mouth daily. May take with food to minimize GI side effects. 10/30/13  Yes [name], MD  lisinopril (PRINIVIL,ZESTRIL) 10 MG tablet Take 1 tablet (10 mg total) by mouth daily. 01/22/14  Yes [name], MD  metoprolol tartrate (LOPRESSOR) 25 MG tablet Take 1 tablet (25 mg total) by mouth daily. 08/22/13  Yes [name], MD  morphine (MS CONTIN) 30 MG 12 hr tablet Take 1 tablet (30 mg total) by mouth every 12 (twelve) hours. 08/08/14  Yes Micheline Chapman, NP  potassium chloride SA (K-DUR,KLOR-CON) 20 MEQ tablet Take 1 tablet (20 mEq total) by mouth every morning. 06/09/14  Yes [name], MD  zolpidem (AMBIEN) 10 MG tablet Take 1 tablet (10 mg total) by mouth at bedtime as needed for sleep. 08/08/14  Yes Micheline Chapman, NP  aspirin 81 MG chewable tablet Chew 1 tablet (81 mg total) by mouth daily. Patient not taking: Reported on 08/29/2014 07/23/14   [name], MD   BP 129/91 mmHg  Pulse 110  Temp(Src) 98.5 F (36.9 C) (Oral)  Resp 18  Ht 6' (1.829 m)  Wt 158 lb 11.7 oz (72 kg)  BMI 21.52 kg/m2  SpO2 93% Physical Exam  Constitutional: He is oriented to person, place, and time. He appears well-developed and well-nourished. He appears distressed (in pain).  HENT:  Head: Normocephalic and atraumatic.  Eyes: Conjunctivae and EOM are normal.  Neck: Normal range of motion.  Cardiovascular: Normal heart sounds and intact distal pulses.  An irregularly irregular rhythm present. Tachycardia present.  Exam reveals no gallop and no friction rub.   No murmur heard. Pulmonary/Chest: Effort normal and breath sounds normal. No respiratory distress. He has no wheezes. He has no rales.  Abdominal: Soft. He exhibits no distension. There is no tenderness. There is no guarding.  Musculoskeletal: He exhibits tenderness (bilateral lower extremities, no  erythema or swelling of ankle/knee). He exhibits no edema.  Neurological: He is alert and oriented to person, place, and time.  Skin: Skin is warm and dry. He is not diaphoretic.  Nursing note and vitals reviewed.   ED Course  Procedures (including critical care time) Labs Review Labs Reviewed  CBC WITH DIFFERENTIAL/PLATELET - Abnormal; Notable for the following:    WBC 19.0 (*)    RBC 2.97 (*)    Hemoglobin 9.3 (*)    HCT 27.0 (*)    RDW 22.4 (*)    Platelets 468 (*)    Lymphocytes Relative 10 (*)    nRBC 19 (*)    Neutro Abs 14.6 (*)    Monocytes Absolute 2.1 (*)    Basophils Absolute  0.2 (*)    All other components within normal limits  COMPREHENSIVE METABOLIC PANEL - Abnormal; Notable for the following:    Potassium 3.3 (*)    Glucose, Bld 131 (*)    BUN <5 (*)    Creatinine, Ser 0.53 (*)    Total Protein 8.9 (*)    AST 45 (*)    Total Bilirubin 8.7 (*)    All other components within normal limits  RETICULOCYTES - Abnormal; Notable for the following:    Retic Ct Pct >23.0 (*)    RBC. 2.97 (*)    All other components within normal limits  BRAIN NATRIURETIC PEPTIDE - Abnormal; Notable for the following:    B Natriuretic Peptide 1117.0 (*)    All other components within normal limits  TROPONIN I - Abnormal; Notable for the following:    Troponin I 0.50 (*)    All other components within normal limits  MAGNESIUM - Abnormal; Notable for the following:    Magnesium 1.6 (*)    All other components within normal limits  URINALYSIS, ROUTINE W REFLEX MICROSCOPIC (NOT AT [facility]) - Abnormal; Notable for the following:    Color, Urine AMBER (*)    Specific Gravity, Urine >1.046 (*)    Hgb urine dipstick MODERATE (*)    Bilirubin Urine SMALL (*)    Protein, ur >300 (*)    Urobilinogen, UA 4.0 (*)    All other components within normal limits  HEMOGLOBIN AND HEMATOCRIT, BLOOD - Abnormal; Notable for the following:    Hemoglobin 6.9 (*)    HCT 20.8 (*)    All other components  within normal limits  URINE MICROSCOPIC-ADD ON - Abnormal; Notable for the following:    Squamous Epithelial / LPF FEW (*)    All other components within normal limits  BLOOD GAS, ARTERIAL - Abnormal; Notable for the following:    pCO2 arterial 34.1 (*)    Bicarbonate 19.8 (*)    Acid-base deficit 4.2 (*)    All other components within normal limits  I-STAT TROPOININ, ED - Abnormal; Notable for the following:    Troponin i, poc 0.42 (*)    All other components within normal limits  MRSA PCR SCREENING  APTT  CBC WITH DIFFERENTIAL/PLATELET  BASIC METABOLIC PANEL  LACTATE DEHYDROGENASE  TROPONIN I  TROPONIN I  TROPONIN I  CBC WITH DIFFERENTIAL/PLATELET  I-STAT CG4 LACTIC ACID, ED  I-STAT CG4 LACTIC ACID, ED  I-STAT TROPOININ, ED  I-STAT TROPOININ, ED  TYPE AND SCREEN  PREPARE RBC (CROSSMATCH)  PREPARE RBC (CROSSMATCH)    Imaging Review Dg Chest 2 View  08/29/2014   CLINICAL DATA:  Sickle cell crisis. Chest pain. Severe bilateral lower extremity pain.  EXAM: CHEST  2 VIEW  COMPARISON:  08/24/2014.  FINDINGS: No change. Artifact overlies the chest. Cardiac silhouette remains enlarged. Mediastinal shadows are otherwise normal. Power port inserted from the left subclavian vein has its tip in the SVC just above the right atrium. Vascular plethora remains present without edema or effusions. Pulmonary scarring remains present.  IMPRESSION: No acute finding. Cardiomegaly. Pulmonary vascular plethora. Pulmonary scarring.   Electronically Signed   By: Nelson Chimes M.D.   On: 08/29/2014 08:49   Ct Angio Chest Pe W/cm &/or Wo Cm  08/29/2014   CLINICAL DATA:  Chest pain.  Sickle cell.  Recent pulmonary embolism  EXAM: CT ANGIOGRAPHY CHEST WITH CONTRAST  TECHNIQUE: Multidetector CT imaging of the chest was performed using the standard protocol during bolus administration  of intravenous contrast. Multiplanar CT image reconstructions and MIPs were obtained to evaluate the vascular anatomy.  CONTRAST:   14mL OMNIPAQUE IOHEXOL 350 MG/ML SOLN  COMPARISON:  CT chest 07/20/2014  FINDINGS: Small filling defects in the lower lobe pulmonary arteries are unchanged from the prior study and consistent with residual pulmonary emboli which are not dissolved. No new emboli identified. It is unlikely these are recurrent emboli as the are in the exact same location as the prior study.  Cardiac enlargement without pericardial effusion. Right ventricular enlargement. RV/LV ratio is 1.5 suggesting right heart strain. Pulmonary arteries are enlarged.  Port-A-Cath in the SVC unchanged.  Because  Anterior mediastinal adenopathy measuring up to 12 mm unchanged. No hilar adenopathy.  Patchy airspace densities in the posterior lung bases bilaterally unchanged most consistent with atelectasis or scarring. Additional ground-glass density in the lungs bilaterally most notably in the right lung are unchanged and may be related to chronic lung disease or pulmonary emboli.  Upper abdomen negative.  Negative for   effusion.  Review of the MIP images confirms the above findings.  IMPRESSION: Small filling defects in the lower lobe pulmonary arteries unchanged from the CT of 07/20/2014 compatible with residual emboli which have not dissolved. No new emboli identified.  Cardiac enlargement. Pulmonary artery enlargement. Enlarged right ventricle. RV/LV ratio 1.5 consistent with right ventricle strain likely due to pulmonary hypertension.  Critical Value/emergent results were called by telephone at the time of interpretation on 08/29/2014 at 10:28 am to Dr. Gareth Morgan , who verbally acknowledged these results.   Electronically Signed   By: Franchot Gallo M.D.   On: 08/29/2014 10:28   Dg Chest 1v Repeat Same Day  08/29/2014   CLINICAL DATA:  34 year old male with shortness of breath for 1 day. History sickle cell disease.  EXAM: CHEST - 1 VIEW SAME DAY  COMPARISON:  10/21/2014 and prior radiographs  FINDINGS: Cardiomegaly and left subclavian  Port-A-Cath with tip overlying the superior cavoatrial junction again noted.  Increased pulmonary vascularity noted.  There is no evidence of focal airspace disease, pulmonary edema, suspicious pulmonary nodule/mass, pleural effusion, or pneumothorax. No acute bony abnormalities are identified.  IMPRESSION: Cardiomegaly and increased pulmonary vascularity without acute abnormality.   Electronically Signed   By: Margarette Canada M.D.   On: 08/29/2014 19:13     EKG Interpretation   Date/Time:  Friday August 29 2014 07:29:15 EDT Ventricular Rate:  132 PR Interval:    QRS Duration: 113 QT Interval:  337 QTC Calculation: 499 R Axis:   -84 Text Interpretation:  Atrial fibrillation Borderline IVCD with LAD  Consider RVH w/ secondary repol abnormality Atrial fibrillation new from  prior Confirmed by [name] MD, Basir Niven ([id number]) on 08/29/2014 7:36:55 AM      MDM   Final diagnoses:  Chest pain  Atrial Fibrillation with RVR Hypoxia Sickle Cell Pain Crisis   34 year old male with history of sickle cell, pulmonary embolus on on chronic Lovenox therapy (after continuing to have emboli on xarelto), cor pulmonale, acute chest syndrome, recent admission with discharge 2 days ago for hypoxia and sickle cell pain crisis presents with concern of chest pain and bilateral leg pain. Patient hypoxic on arrival to 85% on room air and in atrial fibrillation with a rate at 130s-160s which is new for patient.  XR done to evaluate for signs of acute chest and showed no acute findings.  Given atrial fibrillation, hypoxia, chest pain, ordered CT PE study to evaluated for worsening clot burden in  setting of lovenox anticoagulation to assess for need for other anticoagulation methods. Per records pt baseline hgb 7.5, and prior to discharge, patient was 6.1, and ordered 1 unit PRBCs for transfusion--however hgb 9 with CBC today and cancelled transfusion.  After return of CBC, ordered diltiazem bolus and infusion for atrial  fibrillation with RVR.  Troponin .42, BNP elevated, likely secondary to cor pulmonale from pulmonary hypertension and strain from afib rvr.  CT PE study shows stable PE from prior, without significant resolution, however without worsening.  Patient maintained on 4L of O2.  HR beginning to improve with dilt however BP to 088 systolic. Pt admited to stepdown unit for further care of sickle cell pain crisis, hypoxia, atrial fibrillation with RVR.   CRITICAL CARE:Atrial Fibrillation with RVR Performed by: Alvino Chapel   Total critical care time: 40min  Critical care time was exclusive of separately billable procedures and treating other patients.  Critical care was necessary to treat or prevent imminent or life-threatening deterioration.  Critical care was time spent personally by me on the following activities: development of treatment plan with patient and/or surrogate as well as nursing, discussions with consultants, evaluation of patient's response to treatment, examination of patient, obtaining history from patient or surrogate, ordering and performing treatments and interventions, ordering and review of laboratory studies, ordering and review of radiographic studies, pulse oximetry and re-evaluation of patient's condition.       Gareth Morgan, MD 08/29/14 2102

## 2014-08-29 NOTE — H&P (Signed)
Hospital Admission Note Date: 08/29/2014  Patient name: [name] Medical record number: [id number] Date of birth: [birth year] Age: 34 y.o. Gender: male PCP: [name], MD  Attending physician: [name], MD  Chief Complaint: Fast HR x 1 day. Pain x 1 day in legs and ribs.  History of Present Illness: This is an opiate tolerant patient with Hb SS, Cor Pulmonale, PAH and on chronic anticoagulation for recurrent PE who was discharged from the hospital in good condition 2 days ago after being admitted for a sickle Cell Crisis. Pt reports that he went home and was unable to adequately hydrate himself (32 oz fluid in last 24 hours) as he fell asleep from the heat.(No air-conditioning). Currently he rates his pain as 8/10 and localized to LLE, ribs and back. He describes the pain as throbbing and non-radiating in nature. He took his oral medications with little relief.  He also reports 2 episodes of nausea and   small amounts of emesis which were without blood or bile. He denies fever, chills, diarrhea, chest pain SOB or DOE. However he does admit to minimal dizziness upon standing this morning.  He reports that he  awoke early this morning in pain and with his "heart racing". In the ED he was found to have Atrial Fibrillation with RVR. With HR as high as 144. I have reviewed the ED records including EKG which shows A-fib with RVR, and discussed patient with Dr. Billy Fischer (ED Physician) who ordered a CT angiogram to R/O PE. I have reviewed the CT scan with radiology and there are no new findings of PE. Pt has known nocturnal hypoxemia nd is required to sleep with oxygen 2 L/min. He also has intermittent hypoxemia and at the time of discharge 2 days ago, he was saturating 92% on RA with ambulation.  His baseline Hb is 6-7 g/dl and at present his Hb is 9.3 g/dL. He has already received 1L of NS and is currently on a Cardizem gtt at 5 mg/hr and BP is marginal at 105/80 after Cardizem  was started. No orthostatic vitals were obtained before fluid and Cardiazem started. Pt has a h/o pedal edema associated with Cor Pulmonale but at present has no pedal edema.   Scheduled Meds: Continuous Infusions: PRN Meds:. Allergies: Review of patient's allergies indicates no known allergies. Past Medical History  Diagnosis Date  . Sickle cell anemia   . Blood transfusion   . Acute embolism and thrombosis of right internal jugular vein   . Hypokalemia   . Mood disorder   . History of pulmonary embolus (PE)   . Avascular necrosis   . Leukocytosis     Chronic  . Thrombocytosis     Chronic  . Hypertension   . History of Clostridium difficile infection   . Uses marijuana   . Chronic anticoagulation   . Functional asplenia   . Former smoker   . Second hand tobacco smoke exposure   . Alcohol consumption of one to four drinks per day   . Noncompliance with medication regimen   . Sickle-cell crisis with associated acute chest syndrome 05/13/2013  . Acute chest syndrome 06/18/2013  . Demand ischemia 01/02/2014   Past Surgical History  Procedure Laterality Date  . Right hip replacement      08/2006  . Cholecystectomy      01/2008  . Porta cath placement    . Porta cath removal    . Umbilical hernia repair  01/2008  . Excision of left periauricular cyst      10/2009  . Excision of right ear lobe cyst with primary closur      11/2007  . Portacath placement  01/05/2012    Procedure: INSERTION PORT-A-CATH;  Surgeon: Odis Hollingshead, MD;  Location: [place];  Service: General;  Laterality: N/A;  ultrasound guiced port a cath insertion with fluoroscopy   Family History  Problem Relation Age of Onset  . Sickle cell trait Mother   . Depression Mother   . Diabetes Mother   . Sickle cell trait Father   . Sickle cell trait Brother    History   Social History  . Marital Status: Single    Spouse Name: N/A  . Number of Children: 0  . Years of Education: 13   Occupational  History  . 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 Smoking status: Former Smoker -- 13 years    Quit date: 07/08/2010  . Smokeless tobacco: Never Used  . Alcohol Use: No  . Drug Use: 2.00 per week    Special: Marijuana  . Sexual Activity:    Partners: Female    Museum/gallery curator: None     Comment: month ago   Other Topics Concern  . Not on file   Social History Narrative   Lives in an apartment.  Single.  Lives alone but has a girlfriend that helps care for him.  Does not use any assist devices.        [name]:  [phone] Mom, emergency contact   Review of Systems: A comprehensive review of systems was negative except as noted in the HPI. Physical Exam: No intake or output data in the 24 hours ending 08/29/14 1100 General: Alert, awake, oriented x3, in acute distress secondary to pain.  HEENT: [place]/AT PEERL, EOMI, anicteric Neck: Trachea midline,  no masses, no thyromegal,y no JVD, no carotid bruit OROPHARYNX:  Moist, No exudate/ erythema/lesions.  Heart: Tachycardic and irregularly irregular. Unable to appreciate murmur although patient has a known SEM. No S3 or S4 noted.  Lungs: Clear to auscultation, no wheezing or rhonchi noted. No increased vocal fremitus resonant to percussion  Abdomen: Soft, nontender, nondistended, positive bowel sounds, no masses no hepatosplenomegaly noted.  Neuro: No focal neurological deficits noted cranial nerves II through XII grossly intact. Strength appears functional in bilateral upper and lower extremities although formal testing not performed due to current medical condition. Musculoskeletal: No warm swelling or erythema around joints, no spinal tenderness noted. Psychiatric: Patient alert and oriented x3, good insight and cognition, good recent to remote recall. Lymph node survey: No cervical axillary or inguinal lymphadenopathy noted.  Lab results:  Recent Labs   08/29/14 0810  NA 138  K 3.3*  CL 106  CO2 22  GLUCOSE 131*  BUN <5*  CREATININE 0.53*  CALCIUM 9.6    Recent Labs  08/29/14 0810  AST 45*  ALT 35  ALKPHOS 99  BILITOT 8.7*  PROT 8.9*  ALBUMIN 4.6   No results for input(s): LIPASE, AMYLASE in the last 72 hours.  Recent Labs  08/27/14 0612 08/29/14 0810  WBC 13.2* 19.0*  NEUTROABS 7.8* 14.6*  HGB 6.1* 9.3*  HCT 18.0* 27.0*  MCV 87.4 90.9  PLT 356 468*   No results for input(s): CKTOTAL, CKMB, CKMBINDEX, TROPONINI in the last 72 hours. Invalid input(s): POCBNP No results for input(s): DDIMER  in the last 72 hours. No results for input(s): HGBA1C in the last 72 hours. No results for input(s): CHOL, HDL, LDLCALC, TRIG, CHOLHDL, LDLDIRECT in the last 72 hours. No results for input(s): TSH, T4TOTAL, T3FREE, THYROIDAB in the last 72 hours.  Invalid input(s): FREET3  Recent Labs  08/27/14 0612 08/29/14 0810  RETICCTPCT 16.7* >23.0*   Imaging results:  Dg Chest 2 View  08/29/2014   CLINICAL DATA:  Sickle cell crisis. Chest pain. Severe bilateral lower extremity pain.  EXAM: CHEST  2 VIEW  COMPARISON:  08/24/2014.  FINDINGS: No change. Artifact overlies the chest. Cardiac silhouette remains enlarged. Mediastinal shadows are otherwise normal. Power port inserted from the left subclavian vein has its tip in the SVC just above the right atrium. Vascular plethora remains present without edema or effusions. Pulmonary scarring remains present.  IMPRESSION: No acute finding. Cardiomegaly. Pulmonary vascular plethora. Pulmonary scarring.   Electronically Signed   By: Nelson Chimes M.D.   On: 08/29/2014 08:49   Dg Chest 2 View  08/24/2014   CLINICAL DATA:  Sickle cell crisis, shortness of breath  EXAM: CHEST  2 VIEW  COMPARISON:  08/05/2014  FINDINGS: Moderate enlargement of the cardiac silhouette is reidentified. Diffusely prominent interstitial markings are stable with areas of linear probable superimposed scarring. No pleural  effusion. Cholecystectomy clips are noted. No acute osseous finding. Left-sided Port-A-Cath in place with tip at the cavoatrial junction.  IMPRESSION: No focal acute finding.   Electronically Signed   By: Conchita Paris M.D.   On: 08/24/2014 08:41   Dg Chest 2 View  08/03/2014   CLINICAL DATA:  Sickle cell pain crisis.  EXAM: CHEST  2 VIEW  COMPARISON:  07/20/2014  FINDINGS: Stable appearance of the left Port-A-Cath. Catheter tip in the lower SVC region. Prominent lung markings in the mid and lower lungs have not significantly changed. There is no significant airspace disease or pulmonary edema. Heart size remains enlarged. No evidence for pleural effusions.  IMPRESSION: Stable mild cardiomegaly without acute lung findings.   Electronically Signed   By: Markus Daft M.D.   On: 08/03/2014 08:49   Dg Ankle Complete Left  08/24/2014   CLINICAL DATA:  Sickle cell crisis with left lower leg and ankle pain and swelling.  EXAM: LEFT ANKLE COMPLETE - 3+ VIEW  COMPARISON:  12/21/2006  FINDINGS: Diffuse soft tissue swelling is seen surrounding the left ankle. No evidence of fracture, dislocation or bony destruction. The ankle mortise shows normal alignment. No bony lesions or visible bony infarcts by x-ray. No significant degenerative changes.  IMPRESSION: Soft tissue swelling surrounding left ankle. No underlying bony abnormalities are identified.   Electronically Signed   By: Aletta Edouard M.D.   On: 08/24/2014 08:35   Ct Angio Chest Pe W/cm &/or Wo Cm  08/29/2014   CLINICAL DATA:  Chest pain.  Sickle cell.  Recent pulmonary embolism  EXAM: CT ANGIOGRAPHY CHEST WITH CONTRAST  TECHNIQUE: Multidetector CT imaging of the chest was performed using the standard protocol during bolus administration of intravenous contrast. Multiplanar CT image reconstructions and MIPs were obtained to evaluate the vascular anatomy.  CONTRAST:  134mL OMNIPAQUE IOHEXOL 350 MG/ML SOLN  COMPARISON:  CT chest 07/20/2014  FINDINGS: Small  filling defects in the lower lobe pulmonary arteries are unchanged from the prior study and consistent with residual pulmonary emboli which are not dissolved. No new emboli identified. It is unlikely these are recurrent emboli as the are in the exact same location as the  prior study.  Cardiac enlargement without pericardial effusion. Right ventricular enlargement. RV/LV ratio is 1.5 suggesting right heart strain. Pulmonary arteries are enlarged.  Port-A-Cath in the SVC unchanged.  Because  Anterior mediastinal adenopathy measuring up to 12 mm unchanged. No hilar adenopathy.  Patchy airspace densities in the posterior lung bases bilaterally unchanged most consistent with atelectasis or scarring. Additional ground-glass density in the lungs bilaterally most notably in the right lung are unchanged and may be related to chronic lung disease or pulmonary emboli.  Upper abdomen negative.  Negative for   effusion.  Review of the MIP images confirms the above findings.  IMPRESSION: Small filling defects in the lower lobe pulmonary arteries unchanged from the CT of 07/20/2014 compatible with residual emboli which have not dissolved. No new emboli identified.  Cardiac enlargement. Pulmonary artery enlargement. Enlarged right ventricle. RV/LV ratio 1.5 consistent with right ventricle strain likely due to pulmonary hypertension.  Critical Value/emergent results were called by telephone at the time of interpretation on 08/29/2014 at 10:28 am to Dr. Gareth Morgan , who verbally acknowledged these results.   Electronically Signed   By: Franchot Gallo M.D.   On: 08/29/2014 10:28   Ir Fluoro Guide Cv Line Right  08/06/2014   CLINICAL DATA:  Sickle cell  EXAM: RIGHT UPPER EXTREMITY PICC LINE PLACEMENT WITH ULTRASOUND AND FLUOROSCOPIC GUIDANCE  FLUOROSCOPY TIME:  12 seconds  PROCEDURE: The patient was advised of the possible risks and complications and agreed to undergo the procedure. The patient was then brought to the  angiographic suite for the procedure.  The right arm was prepped with chlorhexidine, draped in the usual sterile fashion using maximum barrier technique (cap and mask, sterile gown, sterile gloves, large sterile sheet, hand hygiene and cutaneous antisepsis) and infiltrated locally with 1% Lidocaine.  Ultrasound demonstrated patency of the right basilic vein, and this was documented with an image. Under real-time ultrasound guidance, this vein was accessed with a 21 gauge micropuncture needle and image documentation was performed. A 0.018 wire was introduced in to the vein. Over this, a 5 [place] double lumen power PICC was advanced to the lower SVC/right atrial junction. Fluoroscopy during the procedure and fluoro spot radiograph confirms appropriate catheter position. The catheter was flushed and covered with a sterile dressing.  COMPLICATIONS: None  LENGTH: 45 cm  IMPRESSION: Successful right arm power PICC line placement with ultrasound and fluoroscopic guidance. The catheter is ready for use.   Electronically Signed   By: Marybelle Killings M.D.   On: 08/06/2014 13:16   Ir US Guide Vasc Access Right  08/06/2014   CLINICAL DATA:  Sickle cell  EXAM: RIGHT UPPER EXTREMITY PICC LINE PLACEMENT WITH ULTRASOUND AND FLUOROSCOPIC GUIDANCE  FLUOROSCOPY TIME:  12 seconds  PROCEDURE: The patient was advised of the possible risks and complications and agreed to undergo the procedure. The patient was then brought to the angiographic suite for the procedure.  The right arm was prepped with chlorhexidine, draped in the usual sterile fashion using maximum barrier technique (cap and mask, sterile gown, sterile gloves, large sterile sheet, hand hygiene and cutaneous antisepsis) and infiltrated locally with 1% Lidocaine.  Ultrasound demonstrated patency of the right basilic vein, and this was documented with an image. Under real-time ultrasound guidance, this vein was accessed with a 21 gauge micropuncture needle and image documentation  was performed. A 0.018 wire was introduced in to the vein. Over this, a 5 [place] double lumen power PICC was advanced to the lower SVC/right atrial  junction. Fluoroscopy during the procedure and fluoro spot radiograph confirms appropriate catheter position. The catheter was flushed and covered with a sterile dressing.  COMPLICATIONS: None  LENGTH: 45 cm  IMPRESSION: Successful right arm power PICC line placement with ultrasound and fluoroscopic guidance. The catheter is ready for use.   Electronically Signed   By: Marybelle Killings M.D.   On: 08/06/2014 13:16   Dg Chest Port 1 View  08/05/2014   CLINICAL DATA:  Hypoxia.  Sickle cell pain crisis  EXAM: PORTABLE CHEST - 1 VIEW  COMPARISON:  08/03/2014  FINDINGS: Chronic cardiomegaly. Aortic and hilar contours are stable and negative. Stable left subclavian porta catheter, tip at the upper right atrium.  Chronic reticulation of lung markings consistent with scarring. There is no edema, consolidation, effusion, or pneumothorax.  No acute osseous findings.  IMPRESSION: Stable exam.  No evidence of acute cardiopulmonary disease.   Electronically Signed   By: Monte Fantasia M.D.   On: 08/05/2014 06:08   Other results: EKG: normal EKG, atrial fibrillation, rate 141.   Assessment and Plan: 1. A-fib with RVR: This is likely secondary to a volume depleted state. He has been started on titratable cardizem gtt. Will hold oral cardizem for now when he converts to sinus rhythm will add oral cardizem  As BP allows and wean gtt.  2. Dehydration/Volume depletion: His current Hb is  A reflection of hemoconcentration and he is also clinically dehydrated. He has received a dye load for the CT angiogram so will hydrate aggressivly and monitor closely for decrease in GFR or rise from baseline Cr of 0.4 and BUN of <5. Although his BUN is within normal limits it is increased for him relative to his baseline. 3. Anemia of chronic disease: I suspect that his Hb is more like 6-7 once he  receives IVF. Will transfuse 1 unit RBC's due to hypoxemia and dizziness to improve oxygen carrying capacity. 4. Hypoxemia: No evidence of PE or of acute pneumonic process. Will re-evaluate after transfusion. 5. Hb SS with crisis: IV Dilaudid via PCA. Toradol at decreased dose given dehydration and recent dye load. Continue MS Contin and IVF 0.9 NS. 6. Hypokalemia: Replace. Magnesium marginal so will also give bolus of IV Magnesium. 7. Elevated Troponin: Pt has no chest pain. I suspect that this is secondary to demand and expect that Troponin will decrease within the next several hours. 8. 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 HE needs to maintain pre-load in order to preserve organ perfusion. He should at least exhibit minimal pedal edema at baseline.  9. Chronic Anticoagulation: Pt has a h/o recurrent PE's fr which he is taking IV Lovenox. I will continue Lovenox. I recommend that we refrain from further CTA's to document presence of PE in the absence of hemodynamic instability. The CTA will not alter current management except in a situation where thrombolysis is indicated. This will also limit his exposure to nephrotoxic dye. 10. Treated HTN: Will hold oral Cardizem at this time. When BP improves would re-start Metoprolol and then transition to oral Cardizem as rate improves on Cardizem gtt. 11. F/E/N: Pt on regular diet per his request. Currently volume being replaced with NS but can consider changing to D5.45 when volume replete. Replace Potassium and Magnesium. 12. DVT Prophylaxis: Pt on full dose Lovenox. 13. Activity: Up with assistance as tolerated.  Time spent 60 minutes. 50% of time spent in discussion, counseling and coordination of care.  Jisela Merlino A. 08/29/2014,  11:00 AM

## 2014-08-29 NOTE — Progress Notes (Signed)
Patient ID: [name], male   DOB: 01/06/1981, 34 y.o.   MRN: [id number]  Received call from bedside nurse at 6:05pm requesting a change in diet order to regular diet. She also informed me that patient had an increased Oxygen requirement. She reported his HR as <100 and BP 148/96. She reported that she was increasing his Oxygen concentration and asking respiratory to evaluate. I advised her to change the diet to regular diet and give ordered a dose of lasix 20 mg IV and the decrease IVF to 50 ml/hr. I discussed the patient with Dr. Darnell Level who will see the patient at the bedside and further evaluate and advised the nurse of this.

## 2014-08-29 NOTE — ED Notes (Signed)
Patient transported to X-ray 

## 2014-08-29 NOTE — Progress Notes (Signed)
Safety Portal Event submitted related to the Sickle Cell Medical Doctor coverage after 5pm. Charge Nurse made aware of event. Telephone orders received by Dr. Stann Mainland. Will continue to monitor patient.

## 2014-08-29 NOTE — ED Notes (Signed)
Per conversation with CT. This RN notified charge RN of Peripheral IV/Port Use.  This RN went into room to reassess patients access sights. Denise Pain or discomfort.

## 2014-08-29 NOTE — Progress Notes (Signed)
ANTICOAGULATION CONSULT NOTE - Initial Consult  Pharmacy Consult for Lovenox Indication: chronic anti-coag for hx PE  No Known Allergies  Patient Measurements: Height: 6' (182.9 cm) Weight: 158 lb 11.7 oz (72 kg) IBW/kg (Calculated) : 77.6  Vital Signs: Temp: 97.6 F (36.4 C) (07/29 0717) Temp Source: Oral (07/29 0717) BP: 139/92 mmHg (07/29 1135) Pulse Rate: 132 (07/29 1135)  Labs:  Recent Labs  08/27/14 0612 08/29/14 0810 08/29/14 1015  HGB 6.1* 9.3*  --   HCT 18.0* 27.0*  --   PLT 356 468*  --   APTT  --  30  --   CREATININE  --  0.53*  --   TROPONINI  --   --  0.50*   Estimated Creatinine Clearance: 131.3 mL/min (by C-G formula based on Cr of 0.53).  Medical History: Past Medical History  Diagnosis Date  . Sickle cell anemia   . Blood transfusion   . Acute embolism and thrombosis of right internal jugular vein   . Hypokalemia   . Mood disorder   . History of pulmonary embolus (PE)   . Avascular necrosis   . Leukocytosis     Chronic  . Thrombocytosis     Chronic  . Hypertension   . History of Clostridium difficile infection   . Uses marijuana   . Chronic anticoagulation   . Functional asplenia   . Former smoker   . Second hand tobacco smoke exposure   . Alcohol consumption of one to four drinks per day   . Noncompliance with medication regimen   . Sickle-cell crisis with associated acute chest syndrome 05/13/2013  . Acute chest syndrome 06/18/2013  . Demand ischemia 01/02/2014   Medications:  Scheduled:  . aspirin  81 mg Oral Daily  . [START ON 08/30/2014] enoxaparin (LOVENOX) injection  110 mg Subcutaneous Q24H  . [START ON 6/76/1950] folic acid  1 mg Oral q morning - 10a  . HYDROmorphone PCA 2 mg/mL   Intravenous 6 times per day  . morphine  30 mg Oral Q12H  . potassium chloride SA  20 mEq Oral q morning - 10a  . senna-docusate  1 tablet Oral BID   Assessment: 14 yoM, admit with [facility] pain crisis, was just discharged 7/27. Chest CT: no change  from ct 07/20/14, still with residual emboli, but no new emboli. Resume Lovenox as PTA; 110mg  SQ q24hr, last dose 7/29 am  Goal of Therapy:  Anti-Xa level 0.6-1 units/ml 4hrs after LMWH dose given Monitor platelets by anticoagulation protocol: Yes   Plan:   Lovenox 110mg  SQ q24 hr, resume 7/30  Monitor CBC at least q72 hr, monitor for s/s bleeding  Minda Ditto PharmD Pager [phone] 08/29/2014, 12:01 PM

## 2014-08-29 NOTE — ED Notes (Signed)
Per pt, d/c Wednesday from hospital for sickle cell crisis.  Pain returned on Thursday.  Pain in legs and chest

## 2014-08-29 NOTE — ED Notes (Signed)
Dr.Schlossman was notified of pt's Troponin levels

## 2014-08-29 NOTE — ED Notes (Signed)
MD at bedside per order. Discontinue Blood Transfusion at this time. Pt understands.

## 2014-08-29 NOTE — ED Notes (Signed)
MD at bedside. Admitting  

## 2014-08-29 NOTE — ED Notes (Signed)
Patient transported to CT. With Monitor

## 2014-08-30 ENCOUNTER — Other Ambulatory Visit (HOSPITAL_COMMUNITY): Payer: Self-pay

## 2014-08-30 LAB — CBC WITH DIFFERENTIAL/PLATELET
BASOS ABS: 0 10*3/uL (ref 0.0–0.1)
Basophils Relative: 0 % (ref 0–1)
EOS ABS: 0.4 10*3/uL (ref 0.0–0.7)
Eosinophils Relative: 2 % (ref 0–5)
HEMATOCRIT: 22.8 % — AB (ref 39.0–52.0)
Hemoglobin: 7.5 g/dL — ABNORMAL LOW (ref 13.0–17.0)
LYMPHS ABS: 4.1 10*3/uL — AB (ref 0.7–4.0)
Lymphocytes Relative: 19 % (ref 12–46)
MCH: 30.6 pg (ref 26.0–34.0)
MCHC: 32.9 g/dL (ref 30.0–36.0)
MCV: 93.1 fL (ref 78.0–100.0)
MONO ABS: 0.9 10*3/uL (ref 0.1–1.0)
Monocytes Relative: 4 % (ref 3–12)
NEUTROS ABS: 16.3 10*3/uL — AB (ref 1.7–7.7)
NRBC: 26 /100{WBCs} — AB
Neutrophils Relative %: 75 % (ref 43–77)
Platelets: 440 10*3/uL — ABNORMAL HIGH (ref 150–400)
RBC: 2.45 MIL/uL — ABNORMAL LOW (ref 4.22–5.81)
RDW: 21.9 % — AB (ref 11.5–15.5)
WBC: 21.7 10*3/uL — ABNORMAL HIGH (ref 4.0–10.5)

## 2014-08-30 LAB — BASIC METABOLIC PANEL
Anion gap: 8 (ref 5–15)
CO2: 24 mmol/L (ref 22–32)
Calcium: 8.6 mg/dL — ABNORMAL LOW (ref 8.9–10.3)
Chloride: 108 mmol/L (ref 101–111)
Creatinine, Ser: 0.62 mg/dL (ref 0.61–1.24)
GFR calc non Af Amer: >60 mL/min (ref >60)
Glucose, Bld: 108 mg/dL — ABNORMAL HIGH (ref 65–99)
POTASSIUM: 3.4 mmol/L — AB (ref 3.5–5.1)
SODIUM: 140 mmol/L (ref 135–145)

## 2014-08-30 LAB — TROPONIN I
TROPONIN I: 0.9 ng/mL — AB (ref <0.031)
Troponin I: 0.99 ng/mL (ref <0.031)

## 2014-08-30 LAB — LACTATE DEHYDROGENASE: LDH: 420 U/L — ABNORMAL HIGH (ref 98–192)

## 2014-08-30 MED ORDER — FUROSEMIDE 10 MG/ML IJ SOLN
20.0000 mg | Freq: Once | INTRAMUSCULAR | Status: AC
  Administered 2014-08-30: 20 mg via INTRAVENOUS
  Filled 2014-08-30: qty 2

## 2014-08-30 MED ORDER — KETOROLAC TROMETHAMINE 30 MG/ML IJ SOLN
30.0000 mg | Freq: Four times a day (QID) | INTRAMUSCULAR | Status: DC
  Administered 2014-08-30 – 2014-09-03 (×17): 30 mg via INTRAVENOUS
  Filled 2014-08-30 (×23): qty 1

## 2014-08-30 MED ORDER — HYDROMORPHONE HCL 2 MG/ML IJ SOLN
2.0000 mg | INTRAMUSCULAR | Status: DC | PRN
  Administered 2014-08-30 – 2014-09-02 (×16): 2 mg via INTRAVENOUS
  Filled 2014-08-30 (×13): qty 1

## 2014-08-30 NOTE — Progress Notes (Signed)
ANTIBIOTIC CONSULT NOTE - INITIAL  Pharmacy Consult for Vancomycin Indication: HCAP  No Known Allergies  Patient Measurements: Height: 6' (182.9 cm) Weight: 158 lb 11.7 oz (72 kg) IBW/kg (Calculated) : 77.6   Vital Signs: Temp: 99.1 F (37.3 C) (07/29 2349) Temp Source: Oral (07/29 2349) BP: 125/95 mmHg (07/29 2358) Pulse Rate: 83 (07/29 2349) Intake/Output from previous day: 07/29 0701 - 07/30 0700 In: 1161.4 [I.V.:744.8; Blood:316.7; IV Piggyback:100] Out: 1475 [Urine:1475] Intake/Output from this shift: Total I/O In: -  Out: 500 [Urine:500]  Labs:  Recent Labs  08/27/14 0612 08/29/14 0810 08/29/14 1325 08/29/14 2100  WBC 13.2* 19.0*  --  21.3*  HGB 6.1* 9.3* 6.9* 8.0*  PLT 356 468*  --  456*  CREATININE  --  0.53*  --   --    Estimated Creatinine Clearance: 131.3 mL/min (by C-G formula based on Cr of 0.53). No results for input(s): VANCOTROUGH, VANCOPEAK, VANCORANDOM, GENTTROUGH, GENTPEAK, GENTRANDOM, TOBRATROUGH, TOBRAPEAK, TOBRARND, AMIKACINPEAK, AMIKACINTROU, AMIKACIN in the last 72 hours.   Microbiology: Recent Results (from the past 720 hour(s))  MRSA PCR Screening     Status: None   Collection Time: 08/05/14  9:09 AM  Result Value Ref Range Status   MRSA by PCR NEGATIVE NEGATIVE Final    Comment:        The GeneXpert MRSA Assay (FDA approved for NASAL specimens only), is one component of a comprehensive MRSA colonization surveillance program. It is not intended to diagnose MRSA infection nor to guide or monitor treatment for MRSA infections.   MRSA PCR Screening     Status: None   Collection Time: 08/29/14 11:35 AM  Result Value Ref Range Status   MRSA by PCR NEGATIVE NEGATIVE Final    Comment:        The GeneXpert MRSA Assay (FDA approved for NASAL specimens only), is one component of a comprehensive MRSA colonization surveillance program. It is not intended to diagnose MRSA infection nor to guide or monitor treatment for MRSA  infections.     Medical History: Past Medical History  Diagnosis Date  . Sickle cell anemia   . Blood transfusion   . Acute embolism and thrombosis of right internal jugular vein   . Hypokalemia   . Mood disorder   . History of pulmonary embolus (PE)   . Avascular necrosis   . Leukocytosis     Chronic  . Thrombocytosis     Chronic  . Hypertension   . History of Clostridium difficile infection   . Uses marijuana   . Chronic anticoagulation   . Functional asplenia   . Former smoker   . Second hand tobacco smoke exposure   . Alcohol consumption of one to four drinks per day   . Noncompliance with medication regimen   . Sickle-cell crisis with associated acute chest syndrome 05/13/2013  . Acute chest syndrome 06/18/2013  . Demand ischemia 01/02/2014    Medications:  Prescriptions prior to admission  Medication Sig Dispense Refill Last Dose  . Cholecalciferol (VITAMIN D) 2000 UNITS tablet Take 1 tablet (2,000 Units total) by mouth daily. 90 tablet 3 08/29/2014 at 0600  . diltiazem (CARDIZEM CD) 120 MG 24 hr capsule Take 1 capsule (120 mg total) by mouth daily. 30 capsule 1 08/29/2014 at 0600  . enoxaparin (LOVENOX) 150 MG/ML injection Inject 0.73 mLs (110 mg total) into the skin daily. Starting tonight 07/23/2014 at 10:00pm (Patient taking differently: Inject 110 mg into the skin daily. ) 22 Syringe 5 08/29/2014 at  2330  . folic acid (FOLVITE) 1 MG tablet Take 1 tablet (1 mg total) by mouth every morning. 30 tablet 11 08/29/2014 at 0600  . HYDROmorphone (DILAUDID) 4 MG tablet Take 1 tablet (4 mg total) by mouth every 4 (four) hours as needed for severe pain. 90 tablet 0 08/29/2014 at 0600  . hydroxyurea (HYDREA) 500 MG capsule Take 3 capsules (1,500 mg total) by mouth daily. May take with food to minimize GI side effects. 60 capsule 3 08/29/2014 at 0600  . lisinopril (PRINIVIL,ZESTRIL) 10 MG tablet Take 1 tablet (10 mg total) by mouth daily. 30 tablet 1 08/29/2014 at 0600  . metoprolol  tartrate (LOPRESSOR) 25 MG tablet Take 1 tablet (25 mg total) by mouth daily. 30 tablet 6 08/29/2014 at 0600  . morphine (MS CONTIN) 30 MG 12 hr tablet Take 1 tablet (30 mg total) by mouth every 12 (twelve) hours. 60 tablet 0 08/29/2014 at 0600  . potassium chloride SA (K-DUR,KLOR-CON) 20 MEQ tablet Take 1 tablet (20 mEq total) by mouth every morning. 30 tablet 3 08/29/2014 at 0600  . zolpidem (AMBIEN) 10 MG tablet Take 1 tablet (10 mg total) by mouth at bedtime as needed for sleep. 30 tablet 0 08/26/2014  . aspirin 81 MG chewable tablet Chew 1 tablet (81 mg total) by mouth daily. (Patient not taking: Reported on 08/29/2014) 30 tablet 11 Not Taking at Unknown time   Scheduled:  . aspirin  81 mg Oral Daily  . diltiazem  30 mg Oral 4 times per day  . enoxaparin (LOVENOX) injection  110 mg Subcutaneous Q24H  . folic acid  1 mg Oral q morning - 10a  . HYDROmorphone PCA 2 mg/mL   Intravenous 6 times per day  . metoprolol tartrate  25 mg Oral Daily  . morphine  30 mg Oral Q12H  . piperacillin-tazobactam (ZOSYN)  IV  3.375 g Intravenous Q8H  . potassium chloride SA  20 mEq Oral q morning - 10a  . senna-docusate  1 tablet Oral BID  . vancomycin  1,000 mg Intravenous Q8H   Infusions:  . 0.9 % NaCl with KCl 40 mEq / L 50 mL/hr (08/29/14 1839)  . diltiazem (CARDIZEM) infusion Stopped (08/29/14 1800)   Assessment: 88 yoM admitted with SSC was just discharged 7/27.  Now with hypoxia.  Zosyn per MD and Vancomycin per Rx for HCAP.  Goal of Therapy:  Vancomycin trough level 15-20 mcg/ml  Plan:   Vancomycin 1Gm IV q8h  F/u SCr/cultures/levels as needed  Lawana Pai R 08/30/2014,12:37 AM

## 2014-08-30 NOTE — Progress Notes (Addendum)
Subjective: Patient having 9/10 pain today affecting his left thigh. He is on Dilaudid PCA and has used 40.7 mg with 62 demands and 61 deliveries in the last 24 hours. Complaining of pain not able to sleep well. No change in his respiratory status, no NVD, no chest pain or cough. Troponins slightly elevated but still stable.   Objective: Vital signs in last 24 hours: Temp:  [98 F (36.7 C)-99.1 F (37.3 C)] 98.9 F (37.2 C) (07/30 0815) Pulse Rate:  [74-110] 89 (07/30 0930) Resp:  [12-30] 20 (07/30 1134) BP: (122-153)/(89-100) 140/96 mmHg (07/30 0930) SpO2:  [53 %-100 %] 96 % (07/30 1134) FiO2 (%):  [55 %] 55 % (07/29 1811) Weight:  [70.9 kg (156 lb 4.9 oz)] 70.9 kg (156 lb 4.9 oz) (07/30 0410) Weight change:  Last BM Date: 08/28/14  Intake/Output from previous day: 07/29 0701 - 07/30 0700 In: 2061.4 [I.V.:1344.8; Blood:316.7; IV Piggyback:400] Out: 2325 [Urine:2325] Intake/Output this shift: Total I/O In: 400 [I.V.:150; IV Piggyback:250] Out: -   General appearance: alert, cooperative, appears stated age and mild distress Head: Normocephalic, without obvious abnormality, atraumatic Eyes: conjunctivae/corneas clear. PERRL, EOM's intact. Fundi benign. Neck: no adenopathy, no carotid bruit, no JVD, supple, symmetrical, trachea midline and thyroid not enlarged, symmetric, no tenderness/mass/nodules Back: symmetric, no curvature. ROM normal. No CVA tenderness. Resp: clear to auscultation bilaterally Chest wall: no tenderness Cardio: regular rate and rhythm, S1, S2 normal, no murmur, click, rub or gallop GI: soft, non-tender; bowel sounds normal; no masses,  no organomegaly Extremities: extremities normal, atraumatic, no cyanosis or edema Pulses: 2+ and symmetric Lymph nodes: Cervical, supraclavicular, and axillary nodes normal. Neurologic: Grossly normal  Lab Results:  Recent Labs  08/29/14 2100 08/30/14 0540  WBC 21.3* 21.7*  HGB 8.0* 7.5*  HCT 23.6* 22.8*  PLT 456*  440*   BMET  Recent Labs  08/29/14 0810 08/30/14 0540  NA 138 140  K 3.3* 3.4*  CL 106 108  CO2 22 24  GLUCOSE 131* 108*  BUN <5* <5*  CREATININE 0.53* 0.62  CALCIUM 9.6 8.6*    Studies/Results: Dg Chest 2 View  08/29/2014   CLINICAL DATA:  Sickle cell crisis. Chest pain. Severe bilateral lower extremity pain.  EXAM: CHEST  2 VIEW  COMPARISON:  08/24/2014.  FINDINGS: No change. Artifact overlies the chest. Cardiac silhouette remains enlarged. Mediastinal shadows are otherwise normal. Power port inserted from the left subclavian vein has its tip in the SVC just above the right atrium. Vascular plethora remains present without edema or effusions. Pulmonary scarring remains present.  IMPRESSION: No acute finding. Cardiomegaly. Pulmonary vascular plethora. Pulmonary scarring.   Electronically Signed   By: Nelson Chimes M.D.   On: 08/29/2014 08:49   Ct Angio Chest Pe W/cm &/or Wo Cm  08/29/2014   CLINICAL DATA:  Chest pain.  Sickle cell.  Recent pulmonary embolism  EXAM: CT ANGIOGRAPHY CHEST WITH CONTRAST  TECHNIQUE: Multidetector CT imaging of the chest was performed using the standard protocol during bolus administration of intravenous contrast. Multiplanar CT image reconstructions and MIPs were obtained to evaluate the vascular anatomy.  CONTRAST:  141mL OMNIPAQUE IOHEXOL 350 MG/ML SOLN  COMPARISON:  CT chest 07/20/2014  FINDINGS: Small filling defects in the lower lobe pulmonary arteries are unchanged from the prior study and consistent with residual pulmonary emboli which are not dissolved. No new emboli identified. It is unlikely these are recurrent emboli as the are in the exact same location as the prior study.  Cardiac enlargement without  pericardial effusion. Right ventricular enlargement. RV/LV ratio is 1.5 suggesting right heart strain. Pulmonary arteries are enlarged.  Port-A-Cath in the SVC unchanged.  Because  Anterior mediastinal adenopathy measuring up to 12 mm unchanged. No hilar  adenopathy.  Patchy airspace densities in the posterior lung bases bilaterally unchanged most consistent with atelectasis or scarring. Additional ground-glass density in the lungs bilaterally most notably in the right lung are unchanged and may be related to chronic lung disease or pulmonary emboli.  Upper abdomen negative.  Negative for   effusion.  Review of the MIP images confirms the above findings.  IMPRESSION: Small filling defects in the lower lobe pulmonary arteries unchanged from the CT of 07/20/2014 compatible with residual emboli which have not dissolved. No new emboli identified.  Cardiac enlargement. Pulmonary artery enlargement. Enlarged right ventricle. RV/LV ratio 1.5 consistent with right ventricle strain likely due to pulmonary hypertension.  Critical Value/emergent results were called by telephone at the time of interpretation on 08/29/2014 at 10:28 am to Dr. Gareth Morgan , who verbally acknowledged these results.   Electronically Signed   By: Franchot Gallo M.D.   On: 08/29/2014 10:28   Dg Chest 1v Repeat Same Day  08/29/2014   CLINICAL DATA:  34 year old male with shortness of breath for 1 day. History sickle cell disease.  EXAM: CHEST - 1 VIEW SAME DAY  COMPARISON:  10/21/2014 and prior radiographs  FINDINGS: Cardiomegaly and left subclavian Port-A-Cath with tip overlying the superior cavoatrial junction again noted.  Increased pulmonary vascularity noted.  There is no evidence of focal airspace disease, pulmonary edema, suspicious pulmonary nodule/mass, pleural effusion, or pneumothorax. No acute bony abnormalities are identified.  IMPRESSION: Cardiomegaly and increased pulmonary vascularity without acute abnormality.   Electronically Signed   By: Margarette Canada M.D.   On: 08/29/2014 19:13    Medications: I have reviewed the patient's current medications.  Assessment/Plan: [name] admitted with sickle cell painful crisis and acute hypoxemia. Patient has history of Pulmonary HTN as  well as PE.  #1. Hypoxemic Respiratory failure: Empirically on Vanc and Zosyn. PE ruled out but CXR suggests pulmonary congestion. Will be careful about diuresis if not improving. May DC Abx if no evidence of infection.  #2. Sickle cell Painful crisis: Pain control not adequate. Patient is Opiates tolerant. Will add Toradol and Physician assisted dosing.  #3. Sickle Cell anemia: H/H still stable but Hb dropped to 7.5. Will continue current treatment.   #4. HTN: controlled.   #5. Pulmonary HTN: Probably cause of SOB.   #6. Leukocytosis: Probably due to [facility]. Continue to monitor.  #7. Hx of PE: No evidence of new PE.Continue anticoagulation.  8. A. fib with RVR: This is resolved with Cardizem and metoprolol. He is in normal sinus rhythm now.      LOS: 1 day   Taige Housman,[name] 08/30/2014, 11:55 AM

## 2014-08-31 DIAGNOSIS — I2781 Cor pulmonale (chronic): Secondary | ICD-10-CM

## 2014-08-31 LAB — CBC WITH DIFFERENTIAL/PLATELET
Band Neutrophils: 0 % (ref 0–10)
Basophils Absolute: 0 10*3/uL (ref 0.0–0.1)
Basophils Relative: 0 % (ref 0–1)
Blasts: 0 %
Eosinophils Absolute: 0 10*3/uL (ref 0.0–0.7)
Eosinophils Relative: 0 % (ref 0–5)
HCT: 22.4 % — ABNORMAL LOW (ref 39.0–52.0)
Hemoglobin: 7.6 g/dL — ABNORMAL LOW (ref 13.0–17.0)
Lymphocytes Relative: 3 % — ABNORMAL LOW (ref 12–46)
Lymphs Abs: 0.6 10*3/uL — ABNORMAL LOW (ref 0.7–4.0)
MCH: 32.5 pg (ref 26.0–34.0)
MCHC: 33.9 g/dL (ref 30.0–36.0)
MCV: 95.7 fL (ref 78.0–100.0)
MYELOCYTES: 0 %
Metamyelocytes Relative: 0 %
Monocytes Absolute: 4.5 10*3/uL — ABNORMAL HIGH (ref 0.1–1.0)
Monocytes Relative: 22 % — ABNORMAL HIGH (ref 3–12)
NEUTROS PCT: 75 % (ref 43–77)
Neutro Abs: 15.5 10*3/uL — ABNORMAL HIGH (ref 1.7–7.7)
Platelets: 445 10*3/uL — ABNORMAL HIGH (ref 150–400)
Promyelocytes Absolute: 0 %
RBC: 2.34 MIL/uL — ABNORMAL LOW (ref 4.22–5.81)
RDW: 23.3 % — AB (ref 11.5–15.5)
WBC: 20.6 10*3/uL — ABNORMAL HIGH (ref 4.0–10.5)
nRBC: 34 /100 WBC — ABNORMAL HIGH

## 2014-08-31 LAB — COMPREHENSIVE METABOLIC PANEL
ALT: 22 U/L (ref 17–63)
AST: 21 U/L (ref 15–41)
Albumin: 3.6 g/dL (ref 3.5–5.0)
Alkaline Phosphatase: 65 U/L (ref 38–126)
Anion gap: 4 — ABNORMAL LOW (ref 5–15)
BUN: 7 mg/dL (ref 6–20)
CALCIUM: 7.9 mg/dL — AB (ref 8.9–10.3)
CO2: 23 mmol/L (ref 22–32)
Chloride: 111 mmol/L (ref 101–111)
Creatinine, Ser: 0.62 mg/dL (ref 0.61–1.24)
GFR calc non Af Amer: >60 mL/min (ref >60)
Glucose, Bld: 104 mg/dL — ABNORMAL HIGH (ref 65–99)
Potassium: 4.3 mmol/L (ref 3.5–5.1)
SODIUM: 138 mmol/L (ref 135–145)
TOTAL PROTEIN: 6.9 g/dL (ref 6.5–8.1)
Total Bilirubin: 4.7 mg/dL — ABNORMAL HIGH (ref 0.3–1.2)

## 2014-08-31 LAB — VANCOMYCIN, TROUGH: Vancomycin Tr: 18 ug/mL (ref 10.0–20.0)

## 2014-08-31 MED ORDER — SODIUM CHLORIDE 0.9 % IJ SOLN
10.0000 mL | INTRAMUSCULAR | Status: DC | PRN
  Administered 2014-09-01 – 2014-09-03 (×2): 10 mL
  Filled 2014-08-31 (×2): qty 40

## 2014-08-31 NOTE — Progress Notes (Signed)
ANTIBIOTIC CONSULT NOTE   Pharmacy Consult for Vancomycin Indication: HCAP  No Known Allergies  Patient Measurements: Height: 6' (182.9 cm) Weight: 158 lb 8.2 oz (71.9 kg) IBW/kg (Calculated) : 77.6  Vital Signs: Temp: 98 F (36.7 C) (07/31 1200) Temp Source: Oral (07/31 1200) BP: 120/84 mmHg (07/31 1211) Pulse Rate: 72 (07/31 0933) Intake/Output from previous day: 07/30 0701 - 07/31 0700 In: 2230 [P.O.:240; I.V.:1190; IV Piggyback:800] Out: 1775 [Urine:1775] Intake/Output from this shift: Total I/O In: 800 [I.V.:500; IV Piggyback:300] Out: 900 [Urine:900]  Labs:  Recent Labs  08/29/14 0810  08/29/14 2100 08/30/14 0540 08/31/14 0455  WBC 19.0*  --  21.3* 21.7* 20.6*  HGB 9.3*  < > 8.0* 7.5* 7.6*  PLT 468*  --  456* 440* 445*  CREATININE 0.53*  --   --  0.62 0.62  < > = values in this interval not displayed. Estimated Creatinine Clearance: 131.1 mL/min (by C-G formula based on Cr of 0.62).  Recent Labs  08/31/14 1445  Loda 18    Microbiology: Recent Results (from the past 720 hour(s))  MRSA PCR Screening     Status: None   Collection Time: 08/05/14  9:09 AM  Result Value Ref Range Status   MRSA by PCR NEGATIVE NEGATIVE Final    Comment:        The GeneXpert MRSA Assay (FDA approved for NASAL specimens only), is one component of a comprehensive MRSA colonization surveillance program. It is not intended to diagnose MRSA infection nor to guide or monitor treatment for MRSA infections.   MRSA PCR Screening     Status: None   Collection Time: 08/29/14 11:35 AM  Result Value Ref Range Status   MRSA by PCR NEGATIVE NEGATIVE Final    Comment:        The GeneXpert MRSA Assay (FDA approved for NASAL specimens only), is one component of a comprehensive MRSA colonization surveillance program. It is not intended to diagnose MRSA infection nor to guide or monitor treatment for MRSA infections.   Culture, blood (routine x 2)     Status: None  (Preliminary result)   Collection Time: 08/29/14  9:30 PM  Result Value Ref Range Status   Specimen Description BLOOD RIGHT WRIST  Final   Special Requests IN PEDIATRIC BOTTLE 3ML  Final   Culture   Final    NO GROWTH 1 DAY Performed at [facility]    Report Status PENDING  Incomplete  Culture, blood (routine x 2)     Status: None (Preliminary result)   Collection Time: 08/29/14  9:39 PM  Result Value Ref Range Status   Specimen Description BLOOD BLOOD RIGHT HAND  Final   Special Requests IN PEDIATRIC BOTTLE 3ML  Final   Culture   Final    NO GROWTH 1 DAY Performed at [facility]    Report Status PENDING  Incomplete   Medical History: Past Medical History  Diagnosis Date  . Sickle cell anemia   . Blood transfusion   . Acute embolism and thrombosis of right internal jugular vein   . Hypokalemia   . Mood disorder   . History of pulmonary embolus (PE)   . Avascular necrosis   . Leukocytosis     Chronic  . Thrombocytosis     Chronic  . Hypertension   . History of Clostridium difficile infection   . Uses marijuana   . Chronic anticoagulation   . Functional asplenia   . Former smoker   .  Second hand tobacco smoke exposure   . Alcohol consumption of one to four drinks per day   . Noncompliance with medication regimen   . Sickle-cell crisis with associated acute chest syndrome 05/13/2013  . Acute chest syndrome 06/18/2013  . Demand ischemia 01/02/2014   Medications:  Prescriptions prior to admission  Medication Sig Dispense Refill Last Dose  . Cholecalciferol (VITAMIN D) 2000 UNITS tablet Take 1 tablet (2,000 Units total) by mouth daily. 90 tablet 3 08/29/2014 at 0600  . diltiazem (CARDIZEM CD) 120 MG 24 hr capsule Take 1 capsule (120 mg total) by mouth daily. 30 capsule 1 08/29/2014 at 0600  . enoxaparin (LOVENOX) 150 MG/ML injection Inject 0.73 mLs (110 mg total) into the skin daily. Starting tonight 07/23/2014 at 10:00pm (Patient taking differently: Inject  110 mg into the skin daily. ) 22 Syringe 5 08/29/2014 at 0600  . folic acid (FOLVITE) 1 MG tablet Take 1 tablet (1 mg total) by mouth every morning. 30 tablet 11 08/29/2014 at 0600  . HYDROmorphone (DILAUDID) 4 MG tablet Take 1 tablet (4 mg total) by mouth every 4 (four) hours as needed for severe pain. 90 tablet 0 08/29/2014 at 0600  . hydroxyurea (HYDREA) 500 MG capsule Take 3 capsules (1,500 mg total) by mouth daily. May take with food to minimize GI side effects. 60 capsule 3 08/29/2014 at 0600  . lisinopril (PRINIVIL,ZESTRIL) 10 MG tablet Take 1 tablet (10 mg total) by mouth daily. 30 tablet 1 08/29/2014 at 0600  . metoprolol tartrate (LOPRESSOR) 25 MG tablet Take 1 tablet (25 mg total) by mouth daily. 30 tablet 6 08/29/2014 at 0600  . morphine (MS CONTIN) 30 MG 12 hr tablet Take 1 tablet (30 mg total) by mouth every 12 (twelve) hours. 60 tablet 0 08/29/2014 at 0600  . potassium chloride SA (K-DUR,KLOR-CON) 20 MEQ tablet Take 1 tablet (20 mEq total) by mouth every morning. 30 tablet 3 08/29/2014 at 0600  . zolpidem (AMBIEN) 10 MG tablet Take 1 tablet (10 mg total) by mouth at bedtime as needed for sleep. 30 tablet 0 08/26/2014  . aspirin 81 MG chewable tablet Chew 1 tablet (81 mg total) by mouth daily. (Patient not taking: Reported on 08/29/2014) 30 tablet 11 Not Taking at Unknown time   Scheduled:  . aspirin  81 mg Oral Daily  . diltiazem  30 mg Oral 4 times per day  . enoxaparin (LOVENOX) injection  110 mg Subcutaneous Q24H  . folic acid  1 mg Oral q morning - 10a  . HYDROmorphone PCA 2 mg/mL   Intravenous 6 times per day  . ketorolac  30 mg Intravenous 4 times per day  . metoprolol tartrate  25 mg Oral Daily  . morphine  30 mg Oral Q12H  . piperacillin-tazobactam (ZOSYN)  IV  3.375 g Intravenous Q8H  . potassium chloride SA  20 mEq Oral q morning - 10a  . senna-docusate  1 tablet Oral BID  . vancomycin  1,000 mg Intravenous Q8H   Assessment: 51 yoM admitted with SSC was just discharged 7/27.   Now with hypoxia.  Zosyn per MD and Vancomycin per Rx for HCAP.  Vancomycin trough 18 mcg/ml on 1gm q8hr, level in range  Goal of Therapy:  Vancomycin trough level 15-20 mcg/ml  Plan:   No change Vancomycin 1Gm IV q8h  F/u SCr/cultures/levels as needed  Minda Ditto PharmD Pager [phone] 08/31/2014, 4:12 PM

## 2014-08-31 NOTE — Progress Notes (Addendum)
Subjective: Patient still having 8/10 pain  affecting his left thigh. Pain waxes and wanes. He is on Dilaudid PCA and has used 39.9 mg with 59 demands and 57 deliveries in the last 24 hours. No shortness of breath, no NVD. Patient has been in bed mostly. No chest pain and troponins have remained stable.  Objective: Vital signs in last 24 hours: Temp:  [98 F (36.7 C)-99.7 F (37.6 C)] 98 F (36.7 C) (07/31 1200) Pulse Rate:  [66-90] 72 (07/31 0933) Resp:  [13-30] 16 (07/31 0855) BP: (105-149)/(61-103) 124/84 mmHg (07/31 0933) SpO2:  [92 %-100 %] 96 % (07/31 0933) Weight:  [71.9 kg (158 lb 8.2 oz)] 71.9 kg (158 lb 8.2 oz) (07/31 0400) Weight change: -0.1 kg (-3.5 oz) Last BM Date: 08/28/14  Intake/Output from previous day: 07/30 0701 - 07/31 0700 In: 2230 [P.O.:240; I.V.:1190; IV Piggyback:800] Out: 6979 [Urine:1775] Intake/Output this shift: Total I/O In: 450 [I.V.:250; IV Piggyback:200] Out: 380 [Urine:380]  General appearance: alert, cooperative, appears stated age and mild distress Head: Normocephalic, without obvious abnormality, atraumatic Eyes: conjunctivae/corneas clear. PERRL, EOM's intact. Fundi benign. Neck: no adenopathy, no carotid bruit, no JVD, supple, symmetrical, trachea midline and thyroid not enlarged, symmetric, no tenderness/mass/nodules Back: symmetric, no curvature. ROM normal. No CVA tenderness. Resp: clear to auscultation bilaterally Chest wall: no tenderness Cardio: regular rate and rhythm, S1, S2 normal, no murmur, click, rub or gallop GI: soft, non-tender; bowel sounds normal; no masses,  no organomegaly Extremities: extremities normal, atraumatic, no cyanosis or edema Pulses: 2+ and symmetric Lymph nodes: Cervical, supraclavicular, and axillary nodes normal. Neurologic: Grossly normal  Lab Results:  Recent Labs  08/30/14 0540 08/31/14 0455  WBC 21.7* 20.6*  HGB 7.5* 7.6*  HCT 22.8* 22.4*  PLT 440* 445*   BMET  Recent Labs   08/30/14 0540 08/31/14 0455  NA 140 138  K 3.4* 4.3  CL 108 111  CO2 24 23  GLUCOSE 108* 104*  BUN <5* 7  CREATININE 0.62 0.62  CALCIUM 8.6* 7.9*    Studies/Results: Dg Chest 1v Repeat Same Day  08/29/2014   CLINICAL DATA:  34 year old male with shortness of breath for 1 day. History sickle cell disease.  EXAM: CHEST - 1 VIEW SAME DAY  COMPARISON:  10/21/2014 and prior radiographs  FINDINGS: Cardiomegaly and left subclavian Port-A-Cath with tip overlying the superior cavoatrial junction again noted.  Increased pulmonary vascularity noted.  There is no evidence of focal airspace disease, pulmonary edema, suspicious pulmonary nodule/mass, pleural effusion, or pneumothorax. No acute bony abnormalities are identified.  IMPRESSION: Cardiomegaly and increased pulmonary vascularity without acute abnormality.   Electronically Signed   By: Margarette Canada M.D.   On: 08/29/2014 19:13    Medications: I have reviewed the patient's current medications.  Assessment/Plan: [name] admitted with sickle cell painful crisis and acute hypoxemia. Patient has history of Pulmonary HTN as well as PE.  #1. Increased Troponins: Most likely due to pulmonary HTN and right sided strain. Also due to Afib with RVR. No chest pain, No evidence of MI. Continue current treatment and Tele monitoring. Transfer out of ICU today.  #2 Hypoxemic Respiratory failure: Empirically on Vanc and Zosyn. PE ruled out but CXR suggests pulmonary congestion. Dc antibiotics after today and continue with gentle diuresis.  #3. Sickle cell Painful crisis: Patient is Opiates tolerant. On Toradol and Physician assisted dosing of 2 mg Q3hrs. Will keep on current regimen with the PCA dosing. Continue ghis long acting oral Morphine.  #4.  Sickle Cell anemia: H/H still stable. Continue to monitor.  #5. HTN: controlled.   #6. Pulmonary HTN: Probably cause of SOB.   #7. Leukocytosis: Probably due to [facility]. Continue to monitor.  #8. Hx of PE: No  evidence of new PE.Continue anticoagulation.  #9. Afib with RVR: Resolved. Now in NSR.      LOS: 2 days   [name],[name] 08/31/2014, 12:06 PM

## 2014-08-31 NOTE — Progress Notes (Signed)
Dilaudid 1mg   For PCA wasted in sink witnessed by Rulon Abide, RN

## 2014-09-01 DIAGNOSIS — D571 Sickle-cell disease without crisis: Secondary | ICD-10-CM

## 2014-09-01 DIAGNOSIS — D72829 Elevated white blood cell count, unspecified: Secondary | ICD-10-CM

## 2014-09-01 DIAGNOSIS — Z7901 Long term (current) use of anticoagulants: Secondary | ICD-10-CM

## 2014-09-01 DIAGNOSIS — Z959 Presence of cardiac and vascular implant and graft, unspecified: Secondary | ICD-10-CM

## 2014-09-01 DIAGNOSIS — B9689 Other specified bacterial agents as the cause of diseases classified elsewhere: Secondary | ICD-10-CM

## 2014-09-01 DIAGNOSIS — R7881 Bacteremia: Secondary | ICD-10-CM

## 2014-09-01 DIAGNOSIS — Z86711 Personal history of pulmonary embolism: Secondary | ICD-10-CM

## 2014-09-01 LAB — CBC WITH DIFFERENTIAL/PLATELET
BAND NEUTROPHILS: 0 % (ref 0–10)
BASOS ABS: 0 10*3/uL (ref 0.0–0.1)
Basophils Relative: 0 % (ref 0–1)
Blasts: 0 %
Eosinophils Absolute: 1 10*3/uL — ABNORMAL HIGH (ref 0.0–0.7)
Eosinophils Relative: 6 % — ABNORMAL HIGH (ref 0–5)
HCT: 22 % — ABNORMAL LOW (ref 39.0–52.0)
HEMOGLOBIN: 7.4 g/dL — AB (ref 13.0–17.0)
LYMPHS ABS: 2.9 10*3/uL (ref 0.7–4.0)
Lymphocytes Relative: 17 % (ref 12–46)
MCH: 32.3 pg (ref 26.0–34.0)
MCHC: 33.6 g/dL (ref 30.0–36.0)
MCV: 96.1 fL (ref 78.0–100.0)
MONO ABS: 1.3 10*3/uL — AB (ref 0.1–1.0)
MYELOCYTES: 0 %
Metamyelocytes Relative: 0 %
Monocytes Relative: 8 % (ref 3–12)
NEUTROS ABS: 11.6 10*3/uL — AB (ref 1.7–7.7)
Neutrophils Relative %: 69 % (ref 43–77)
OTHER: 0 %
Platelets: 448 10*3/uL — ABNORMAL HIGH (ref 150–400)
Promyelocytes Absolute: 0 %
RBC: 2.29 MIL/uL — AB (ref 4.22–5.81)
RDW: 22.9 % — AB (ref 11.5–15.5)
WBC: 16.8 10*3/uL — AB (ref 4.0–10.5)
nRBC: 32 /100 WBC — ABNORMAL HIGH

## 2014-09-01 LAB — TYPE AND SCREEN
ABO/RH(D): O POS
Antibody Screen: NEGATIVE
Unit division: 0

## 2014-09-01 LAB — COMPREHENSIVE METABOLIC PANEL
ALT: 18 U/L (ref 17–63)
AST: 22 U/L (ref 15–41)
Albumin: 3.5 g/dL (ref 3.5–5.0)
Alkaline Phosphatase: 70 U/L (ref 38–126)
Anion gap: 7 (ref 5–15)
BUN: 8 mg/dL (ref 6–20)
CALCIUM: 8.7 mg/dL — AB (ref 8.9–10.3)
CO2: 22 mmol/L (ref 22–32)
Chloride: 111 mmol/L (ref 101–111)
Creatinine, Ser: 0.61 mg/dL (ref 0.61–1.24)
GFR calc Af Amer: >60 mL/min (ref >60)
GLUCOSE: 104 mg/dL — AB (ref 65–99)
Potassium: 4.4 mmol/L (ref 3.5–5.1)
Sodium: 140 mmol/L (ref 135–145)
Total Bilirubin: 4 mg/dL — ABNORMAL HIGH (ref 0.3–1.2)
Total Protein: 7 g/dL (ref 6.5–8.1)

## 2014-09-01 LAB — TROPONIN I: TROPONIN I: 0.47 ng/mL — AB (ref <0.031)

## 2014-09-01 LAB — URIC ACID: Uric Acid, Serum: 5 mg/dL (ref 4.4–7.6)

## 2014-09-01 MED ORDER — MUPIROCIN 2 % EX OINT
TOPICAL_OINTMENT | Freq: Two times a day (BID) | CUTANEOUS | Status: DC
  Administered 2014-09-02 – 2014-09-03 (×3): via TOPICAL
  Filled 2014-09-01: qty 22

## 2014-09-01 MED ORDER — DILTIAZEM HCL ER COATED BEADS 120 MG PO CP24
120.0000 mg | ORAL_CAPSULE | Freq: Every day | ORAL | Status: DC
  Administered 2014-09-01 – 2014-09-03 (×3): 120 mg via ORAL
  Filled 2014-09-01 (×3): qty 1

## 2014-09-01 MED ORDER — MUPIROCIN 2 % EX OINT: 1.0000 "application " | TOPICAL_OINTMENT | Freq: Two times a day (BID) | CUTANEOUS | Status: DC

## 2014-09-01 MED ORDER — LIDOCAINE HCL 1 % IJ SOLN
10.0000 mL | Freq: Once | INTRAMUSCULAR | Status: DC
Start: 2014-09-01 — End: 2014-09-03
  Filled 2014-09-01: qty 10

## 2014-09-01 MED ORDER — MUPIROCIN CALCIUM 2 % EX CREA
TOPICAL_CREAM | Freq: Two times a day (BID) | CUTANEOUS | Status: DC
  Filled 2014-09-01: qty 15

## 2014-09-01 NOTE — Consult Note (Signed)
[facility]  Total days of antibiotics 4        Day 4 vanco        Day 4 piptazo               Reason for Consult: polymicrobial bacteremia    Referring Physician: [email or web address]  Active Problems:   Pulmonary HTN   Chronic anticoagulation   Hb-SS disease with crisis   Hypoxia   Anemia of chronic disease   Cor pulmonale, chronic   Elevated troponin I level   Atrial fibrillation with RVR   Hypokalemia    HPI: [name] is a 34 y.o. male with sickle cell anemia, hx of PE, cor pulmonale, recently discharged on 7/27 for sickle cell crisis, and subsequently readmitted on 7/29 for dehydration, and found to have afib with RVR. During his work-up, he had blood cx which showed GPC in clusters and GN cocco-bacilli in one set of culture with the 2nd set of cultures being negative. He was started empirically on vancomycin and piptazo to treat bacteremia. Since his admit, his leukocytosis has improved, up initially at 21K down to 16.8K. i have reviewed CXR findings that do not suggest any infiltrates consistent with pneumonia. No urinary source to suggests UTI. He does have port in place that is concerning for possible seeding on foreign object in setting of bacteremia.he remains afebrile during this admission  Past Medical History  Diagnosis Date  . Sickle cell anemia   . Blood transfusion   . Acute embolism and thrombosis of right internal jugular vein   . Hypokalemia   . Mood disorder   . History of pulmonary embolus (PE)   . Avascular necrosis   . Leukocytosis     Chronic  . Thrombocytosis     Chronic  . Hypertension   . History of Clostridium difficile infection   . Uses marijuana   . Chronic anticoagulation   . Functional asplenia   . Former smoker   . Second hand tobacco smoke exposure   . Alcohol consumption of one to four drinks per day   . Noncompliance with medication regimen   . Sickle-cell crisis with associated acute chest syndrome  05/13/2013  . Acute chest syndrome 06/18/2013  . Demand ischemia 01/02/2014    Allergies: No Known Allergies   MEDICATIONS: . aspirin  81 mg Oral Daily  . diltiazem  120 mg Oral Daily  . enoxaparin (LOVENOX) injection  110 mg Subcutaneous Q24H  . folic acid  1 mg Oral q morning - 10a  . HYDROmorphone PCA 2 mg/mL   Intravenous 6 times per day  . ketorolac  30 mg Intravenous 4 times per day  . metoprolol tartrate  25 mg Oral Daily  . morphine  30 mg Oral Q12H  . piperacillin-tazobactam (ZOSYN)  IV  3.375 g Intravenous Q8H  . potassium chloride SA  20 mEq Oral q morning - 10a  . senna-docusate  1 tablet Oral BID  . vancomycin  1,000 mg Intravenous Q8H    History  Substance Use Topics  . Smoking status: Former Smoker -- 13 years    Quit date: 07/08/2010  . Smokeless tobacco: Never Used  . Alcohol Use: No    Family History  Problem Relation Age of Onset  . Sickle cell trait Mother   . Depression Mother   . Diabetes Mother   . Sickle cell trait Father   . Sickle cell trait Brother  Review of Systems  Constitutional: Negative for fever, chills, diaphoresis, activity change, appetite change, fatigue and unexpected weight change.  HENT: Negative for congestion, sore throat, rhinorrhea, sneezing, trouble swallowing and sinus pressure.  Eyes: Negative for photophobia and visual disturbance.  Respiratory: Negative for cough, chest tightness, shortness of breath, wheezing and stridor.  Cardiovascular: Negative for chest pain, palpitations and leg swelling.  Gastrointestinal: Negative for nausea, vomiting, abdominal pain, diarrhea, constipation, blood in stool, abdominal distention and anal bleeding.  Genitourinary: Negative for dysuria, hematuria, flank pain and difficulty urinating.  Musculoskeletal: left great toe pain Skin: Negative for color change, pallor, rash and wound.  Neurological: Negative for dizziness, tremors, weakness and light-headedness.  Hematological:  Negative for adenopathy. Does not bruise/bleed easily.  Psychiatric/Behavioral: Negative for behavioral problems, confusion, sleep disturbance, dysphoric mood, decreased concentration and agitation.     OBJECTIVE: Temp:  [97.9 F (36.6 C)-98.4 F (36.9 C)] 98.4 F (36.9 C) (07/31 2231) Pulse Rate:  [72-96] 78 (08/01 1026) Resp:  [13-24] 20 (08/01 1152) BP: (112-129)/(72-91) 129/87 mmHg (08/01 1026) SpO2:  [91 %-99 %] 93 % (08/01 1152) Physical Exam  Constitutional: He is oriented to person, place, and time. He appears well-developed and well-nourished. No distress. Appears chronically ill,wearing nasal cannula HENT:  Mouth/Throat: Oropharynx is clear and moist. No oropharyngeal exudate.  Cardiovascular: Normal rate, regular rhythm and normal heart sounds. Exam reveals no gallop and no friction rub.  No murmur heard.  Pulmonary/Chest: Effort normal and breath sounds normal. No respiratory distress. He has no wheezes.  Abdominal: Soft. Bowel sounds are normal. He exhibits no distension. There is no tenderness.  Lymphadenopathy:  He has no cervical adenopathy.  Neurological: He is alert and oriented to person, place, and time.  Skin: Skin is warm and dry. No rash noted. No erythema. Left sided portacath no erythema or streaking or pain or fluctuance Ext: mild fluctuance no erythema to nail bed of great toe? Early parynochia Psychiatric: He has a normal mood and affect. His behavior is normal.    LABS: Results for orders placed or performed during the hospital encounter of 08/29/14 (from the past 48 hour(s))  CBC with Differential/Platelet     Status: Abnormal   Collection Time: 08/31/14  4:55 AM  Result Value Ref Range   WBC 20.6 (H) 4.0 - 10.5 K/uL    Comment: ADJUSTED FOR NUCLEATED RBC'S   RBC 2.34 (L) 4.22 - 5.81 MIL/uL   Hemoglobin 7.6 (L) 13.0 - 17.0 g/dL   HCT 22.4 (L) 39.0 - 52.0 %   MCV 95.7 78.0 - 100.0 fL   MCH 32.5 26.0 - 34.0 pg   MCHC 33.9 30.0 - 36.0 g/dL   RDW  23.3 (H) 11.5 - 15.5 %   Platelets 445 (H) 150 - 400 K/uL   Neutrophils Relative % 75 43 - 77 %   Lymphocytes Relative 3 (L) 12 - 46 %   Monocytes Relative 22 (H) 3 - 12 %   Eosinophils Relative 0 0 - 5 %   Basophils Relative 0 0 - 1 %   Band Neutrophils 0 0 - 10 %   Metamyelocytes Relative 0 %   Myelocytes 0 %   Promyelocytes Absolute 0 %   Blasts 0 %   nRBC 34 (H) 0 /100 WBC   Neutro Abs 15.5 (H) 1.7 - 7.7 K/uL   Lymphs Abs 0.6 (L) 0.7 - 4.0 K/uL   Monocytes Absolute 4.5 (H) 0.1 - 1.0 K/uL   Eosinophils Absolute 0.0 0.0 -  0.7 K/uL   Basophils Absolute 0.0 0.0 - 0.1 K/uL   RBC Morphology POLYCHROMASIA PRESENT     Comment: TARGET CELLS SICKLE CELLS    Smear Review LARGE PLATELETS PRESENT   Comprehensive metabolic panel     Status: Abnormal   Collection Time: 08/31/14  4:55 AM  Result Value Ref Range   Sodium 138 135 - 145 mmol/L   Potassium 4.3 3.5 - 5.1 mmol/L    Comment: RESULT REPEATED AND VERIFIED DELTA CHECK NOTED NO VISIBLE HEMOLYSIS    Chloride 111 101 - 111 mmol/L   CO2 23 22 - 32 mmol/L   Glucose, Bld 104 (H) 65 - 99 mg/dL   BUN 7 6 - 20 mg/dL   Creatinine, Ser 0.62 0.61 - 1.24 mg/dL   Calcium 7.9 (L) 8.9 - 10.3 mg/dL   Total Protein 6.9 6.5 - 8.1 g/dL   Albumin 3.6 3.5 - 5.0 g/dL   AST 21 15 - 41 U/L   ALT 22 17 - 63 U/L   Alkaline Phosphatase 65 38 - 126 U/L   Total Bilirubin 4.7 (H) 0.3 - 1.2 mg/dL   GFR calc non Af Amer >60 >60 mL/min   GFR calc Af Amer >60 >60 mL/min    Comment: (NOTE) The eGFR has been calculated using the CKD EPI equation. This calculation has not been validated in all clinical situations. eGFR's persistently <60 mL/min signify possible Chronic Kidney Disease.    Anion gap 4 (L) 5 - 15  Vancomycin, trough     Status: None   Collection Time: 08/31/14  2:45 PM  Result Value Ref Range   Vancomycin Tr 18 10.0 - 20.0 ug/mL  CBC with Differential/Platelet     Status: Abnormal   Collection Time: 09/01/14  6:10 AM  Result Value Ref  Range   WBC 16.8 (H) 4.0 - 10.5 K/uL    Comment: WHITE COUNT CONFIRMED ON SMEAR ADJUSTED FOR NUCLEATED RBC'S    RBC 2.29 (L) 4.22 - 5.81 MIL/uL   Hemoglobin 7.4 (L) 13.0 - 17.0 g/dL   HCT 22.0 (L) 39.0 - 52.0 %   MCV 96.1 78.0 - 100.0 fL   MCH 32.3 26.0 - 34.0 pg   MCHC 33.6 30.0 - 36.0 g/dL   RDW 22.9 (H) 11.5 - 15.5 %   Platelets 448 (H) 150 - 400 K/uL    Comment: LARGE PLATELETS PRESENT PLATELET COUNT CONFIRMED BY SMEAR    Neutrophils Relative % 69 43 - 77 %   Lymphocytes Relative 17 12 - 46 %   Monocytes Relative 8 3 - 12 %   Eosinophils Relative 6 (H) 0 - 5 %   Basophils Relative 0 0 - 1 %   Band Neutrophils 0 0 - 10 %   Metamyelocytes Relative 0 %   Myelocytes 0 %   Promyelocytes Absolute 0 %   Blasts 0 %   nRBC 32 (H) 0 /100 WBC   Other 0 %   Neutro Abs 11.6 (H) 1.7 - 7.7 K/uL   Lymphs Abs 2.9 0.7 - 4.0 K/uL   Monocytes Absolute 1.3 (H) 0.1 - 1.0 K/uL   Eosinophils Absolute 1.0 (H) 0.0 - 0.7 K/uL   Basophils Absolute 0.0 0.0 - 0.1 K/uL   RBC Morphology POLYCHROMASIA PRESENT     Comment: TARGET CELLS HOWELL/JOLLY BODIES SICKLE CELLS   Comprehensive metabolic panel     Status: Abnormal   Collection Time: 09/01/14  6:10 AM  Result Value Ref Range   Sodium 140  135 - 145 mmol/L   Potassium 4.4 3.5 - 5.1 mmol/L   Chloride 111 101 - 111 mmol/L   CO2 22 22 - 32 mmol/L   Glucose, Bld 104 (H) 65 - 99 mg/dL   BUN 8 6 - 20 mg/dL   Creatinine, Ser 0.61 0.61 - 1.24 mg/dL   Calcium 8.7 (L) 8.9 - 10.3 mg/dL   Total Protein 7.0 6.5 - 8.1 g/dL   Albumin 3.5 3.5 - 5.0 g/dL   AST 22 15 - 41 U/L   ALT 18 17 - 63 U/L   Alkaline Phosphatase 70 38 - 126 U/L   Total Bilirubin 4.0 (H) 0.3 - 1.2 mg/dL   GFR calc non Af Amer >60 >60 mL/min   GFR calc Af Amer >60 >60 mL/min    Comment: (NOTE) The eGFR has been calculated using the CKD EPI equation. This calculation has not been validated in all clinical situations. eGFR's persistently <60 mL/min signify possible Chronic  Kidney Disease.    Anion gap 7 5 - 15  Uric acid     Status: None   Collection Time: 09/01/14  6:10 AM  Result Value Ref Range   Uric Acid, Serum 5.0 4.4 - 7.6 mg/dL  Troponin I     Status: Abnormal   Collection Time: 09/01/14 12:23 PM  Result Value Ref Range   Troponin I 0.47 (H) <0.031 ng/mL    Comment:        PERSISTENTLY INCREASED TROPONIN VALUES IN THE RANGE OF 0.04-0.49 ng/mL CAN BE SEEN IN:       -UNSTABLE ANGINA       -CONGESTIVE HEART FAILURE       -MYOCARDITIS       -CHEST TRAUMA       -ARRYHTHMIAS       -LATE PRESENTING MYOCARDIAL INFARCTION       -COPD   CLINICAL FOLLOW-UP RECOMMENDED.    *Note: Due to a large number of results and/or encounters for the requested time period, some results have not been displayed. A complete set of results can be found in Results Review.    MICRO: 7/29 blood cx 1 of 2 sets: GPCC and GNcoccobacillus - ID pending  IMAGING: Chest SVX:[id number]: Small filling defects in the lower lobe pulmonary arteries unchanged from the CT of 07/20/2014 compatible with residual emboli which have not dissolved. No new emboli identified.  Cardiac enlargement. Pulmonary artery enlargement. Enlarged right ventricle. RV/LV ratio 1.5 consistent with right ventricle strain likely due to pulmonary hypertension.  Assessment/Plan:  34yo M with sickle cell disease, port a cath in place for vascular access, readmitted within 48-72hr of recent discharge for afib with RVR, dehydration, found to have leukocytosis of 21K, and positive blood cultures suggestive of polymicrobial bacteremia.   -polymicrobial bacteremia = recommend to keep on vancomycin and piptazo for now, await culture identification. Given that it was only found in 1 set  Drawn concurrently, makes me think that this may have been a contaminant, however, we do not't have significant explanation for leukocytosis. Repeat blood cultures are pending  - leukocytosis = possibly related to infection  vs. Stress with dehydration and mild troponin/ demand ischemia. Continues to trend down.   - health maintenance = will check hiv ab   -

## 2014-09-01 NOTE — Progress Notes (Addendum)
SICKLE CELL SERVICE PROGRESS NOTE  [name] EXB:[id number] DOB: 02-Jul-1980 DOA: 08/29/2014 PCP: [name], MD  Assessment/Plan: Active Problems:   Pulmonary HTN   Cor pulmonale, chronic   Atrial fibrillation with RVR   Chronic anticoagulation   Hb-SS disease with crisis   Hypoxia   Anemia of chronic disease   Elevated troponin I level   Hypokalemia  1. Bacteremia: BC show GPC in clusters and gram negative coccobacilli. He is currently on Vancomycin and Zosyn. Will continue. Pt has an in-dwelling port and this will likely need to be removed. He is resistant to this and I will consult ID for advice on alternative options.  2. Swelling of left great toe: Pt has swelling of great toe. There is a fluid filled pocket on the lateral aspect of the toe. I have asked Dr. Sharol Given to evaluate and will likely need an I&D. Will also check Uric Acid level.  3. 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 His pulmonary pressures are markedly increased and thus pre-load needs to be maintained to allow for adequate perfusion of organs. 4. Hypoxemia: Pt has had a chronic hypoxemia with oxygen requirements fluctuating from 0-3 l/min. He is currently requiring 3 l/min of oxygen. Will attempt to wean Oxygen.  5. A-fib with RVR: Now resolved. Pt in sinus rhythm with PVC's. He is off Cardizem gtt and on short acting Cardizem. Will change to Cardizem CD 120 mg daily. Continue Meoprolol. 6. Hb SS with crisis: Pt still having pain in left thigh. He has used 59.2 mg with 80/75:demands/deliveries and an additional 10 mg in clinician assisted doses. I suspect that he has had a bone infarct to the left thigh. Continue MS Contin and Ketorolac, Decrease clinician assisted doses and continue PCA at current dose. 7. Leukocytosis: Suspect multifactorial due to bacteremia, crisis and increased bone turnover. 8. Anemia: Pt has an anemia  of chronic disease. He received a transfusion of 1 unit RBC's and Hb remains stable at 7.4. Will continue to monitor. 9. Chronic pain: Continue MS Contin. Will add Neurontin 300 mg daily.then escalate dose. 10. Hypokalemia: treated and resolved. 11. Elevated Troponin: Felt to be secondary to demand. Will check troponin once today to ensure continued trend down. 12. Chronic anticoagulation: Pt has a h/o multiple PE's . He is on chronic anticoagulation with Lovenox. Continue.   Family Communication: N/A Disposition Plan: Not yet ready for discharge  [facility].  Pager [phone]. If 7PM-7AM, please contact night-coverage.  09/01/2014, 9:58 AM  LOS: 3 days    Consultants:  Dr. Baxter Flattery- ID  Dr. Sharol Given- Orthopedic Surgery  Procedures:  None  Antibiotics:  Vancomycin 7/29 >>  Zosyn 7/29 >>  HPI/Subjective: Pt states that pain is localized to left thigh and is currently 6/10. He also has painin ht e:edft great toe which he noticed yesterday.  Objective: Filed Vitals:   09/01/14 0216 09/01/14 0400 09/01/14 0629 09/01/14 0744  BP: 129/91  125/77   Pulse: 96  72   Temp:      TempSrc:      Resp: 22 22 20 22   Height:      Weight:      SpO2: 92% 94% 95% 92%   Weight change:   Intake/Output Summary (Last 24 hours) at 09/01/14 0958 Last data filed at 09/01/14 0629  Gross per 24 hour  Intake 2334.17 ml  Output   1120 ml  Net 1214.17  ml    General: Alert, awake, oriented x3, in no acute distress.  HEENT: [place]/AT PEERL, EOMI, mild icterus Neck: Trachea midline,  no masses, no thyromegal,y no JVD, no carotid bruit OROPHARYNX:  Moist, No exudate/ erythema/lesions.  Heart: Regular rate and rhythm, without murmurs, rubs, gallops, PMI non-displaced, no heaves or thrills on palpation.  Lungs: Clear to auscultation, no wheezing or rhonchi noted. No increased vocal fremitus resonant to percussion  Abdomen: Soft, nontender, nondistended, positive bowel sounds, no masses no  hepatosplenomegaly noted..  Neuro: No focal neurological deficits noted cranial nerves II through XII grossly intact. DTRs 2+ bilaterally upper and lower extremities. Strength 5 out of 5 in bilateral upper and lower extremities. Musculoskeletal: No warm or erythema around left great toe. However there is swelling and tenderness around the aspect of left great tote. No spinal tenderness noted.    Data Reviewed: Basic Metabolic Panel:  Recent Labs Lab 08/26/14 0525 08/29/14 0810 08/30/14 0540 08/31/14 0455 09/01/14 0610  NA 140 138 140 138 140  K 3.9 3.3* 3.4* 4.3 4.4  CL 112* 106 108 111 111  CO2 20* 22 24 23 22   GLUCOSE 114* 131* 108* 104* 104*  BUN 15 <5* <5* 7 8  CREATININE 0.56* 0.53* 0.62 0.62 0.61  CALCIUM 8.7* 9.6 8.6* 7.9* 8.7*  MG  --  1.6*  --   --   --    Liver Function Tests:  Recent Labs Lab 08/29/14 0810 08/31/14 0455 09/01/14 0610  AST 45* 21 22  ALT 35 22 18  ALKPHOS 99 65 70  BILITOT 8.7* 4.7* 4.0*  PROT 8.9* 6.9 7.0  ALBUMIN 4.6 3.6 3.5   No results for input(s): LIPASE, AMYLASE in the last 168 hours. No results for input(s): AMMONIA in the last 168 hours. CBC:  Recent Labs Lab 08/29/14 0810 08/29/14 1325 08/29/14 2100 08/30/14 0540 08/31/14 0455 09/01/14 0610  WBC 19.0*  --  21.3* 21.7* 20.6* 16.8*  NEUTROABS 14.6*  --  16.0* 16.3* 15.5* 11.6*  HGB 9.3* 6.9* 8.0* 7.5* 7.6* 7.4*  HCT 27.0* 20.8* 23.6* 22.8* 22.4* 22.0*  MCV 90.9  --  92.2 93.1 95.7 96.1  PLT 468*  --  456* 440* 445* 448*   Cardiac Enzymes:  Recent Labs Lab 08/29/14 1015 08/29/14 2100 08/30/14 08/30/14 0826  TROPONINI 0.50* 1.01* 0.99* 0.90*   BNP (last 3 results)  Recent Labs  08/29/14 0810  BNP 1117.0*    ProBNP (last 3 results)  Recent Labs  01/01/14 0909  PROBNP 5790.0*    CBG: No results for input(s): GLUCAP in the last 168 hours.  Recent Results (from the past 240 hour(s))  MRSA PCR Screening     Status: None   Collection Time: 08/29/14  11:35 AM  Result Value Ref Range Status   MRSA by PCR NEGATIVE NEGATIVE Final    Comment:        The GeneXpert MRSA Assay (FDA approved for NASAL specimens only), is one component of a comprehensive MRSA colonization surveillance program. It is not intended to diagnose MRSA infection nor to guide or monitor treatment for MRSA infections.   Culture, blood (routine x 2)     Status: None (Preliminary result)   Collection Time: 08/29/14  9:30 PM  Result Value Ref Range Status   Specimen Description BLOOD RIGHT WRIST  Final   Special Requests IN PEDIATRIC BOTTLE 3ML  Final   Culture  Setup Time   Final    GRAM POSITIVE COCCI IN CLUSTERS GRAM  NEGATIVE COCCOBACILLI AEROBIC BOTTLE ONLY CRITICAL RESULT CALLED TO, READ BACK BY AND VERIFIED WITH: E PELLITIER,RN AT 5102 09/01/14 BY L BENFIELD Performed at [facility]    Culture PENDING  Incomplete   Report Status PENDING  Incomplete  Culture, blood (routine x 2)     Status: None (Preliminary result)   Collection Time: 08/29/14  9:39 PM  Result Value Ref Range Status   Specimen Description BLOOD BLOOD RIGHT HAND  Final   Special Requests IN PEDIATRIC BOTTLE 3ML  Final   Culture   Final    NO GROWTH 1 DAY Performed at [facility]    Report Status PENDING  Incomplete     Studies: Dg Chest 2 View  08/29/2014   CLINICAL DATA:  Sickle cell crisis. Chest pain. Severe bilateral lower extremity pain.  EXAM: CHEST  2 VIEW  COMPARISON:  08/24/2014.  FINDINGS: No change. Artifact overlies the chest. Cardiac silhouette remains enlarged. Mediastinal shadows are otherwise normal. Power port inserted from the left subclavian vein has its tip in the SVC just above the right atrium. Vascular plethora remains present without edema or effusions. Pulmonary scarring remains present.  IMPRESSION: No acute finding. Cardiomegaly. Pulmonary vascular plethora. Pulmonary scarring.   Electronically Signed   By: Nelson Chimes M.D.   On: 08/29/2014 08:49    Dg Chest 2 View  08/24/2014   CLINICAL DATA:  Sickle cell crisis, shortness of breath  EXAM: CHEST  2 VIEW  COMPARISON:  08/05/2014  FINDINGS: Moderate enlargement of the cardiac silhouette is reidentified. Diffusely prominent interstitial markings are stable with areas of linear probable superimposed scarring. No pleural effusion. Cholecystectomy clips are noted. No acute osseous finding. Left-sided Port-A-Cath in place with tip at the cavoatrial junction.  IMPRESSION: No focal acute finding.   Electronically Signed   By: Conchita Paris M.D.   On: 08/24/2014 08:41    Dg Ankle Complete Left  08/24/2014   CLINICAL DATA:  Sickle cell crisis with left lower leg and ankle pain and swelling.  EXAM: LEFT ANKLE COMPLETE - 3+ VIEW  COMPARISON:  12/21/2006  FINDINGS: Diffuse soft tissue swelling is seen surrounding the left ankle. No evidence of fracture, dislocation or bony destruction. The ankle mortise shows normal alignment. No bony lesions or visible bony infarcts by x-ray. No significant degenerative changes.  IMPRESSION: Soft tissue swelling surrounding left ankle. No underlying bony abnormalities are identified.   Electronically Signed   By: Aletta Edouard M.D.   On: 08/24/2014 08:35   Ct Angio Chest Pe W/cm &/or Wo Cm  08/29/2014   CLINICAL DATA:  Chest pain.  Sickle cell.  Recent pulmonary embolism  EXAM: CT ANGIOGRAPHY CHEST WITH CONTRAST  TECHNIQUE: Multidetector CT imaging of the chest was performed using the standard protocol during bolus administration of intravenous contrast. Multiplanar CT image reconstructions and MIPs were obtained to evaluate the vascular anatomy.  CONTRAST:  111mL OMNIPAQUE IOHEXOL 350 MG/ML SOLN  COMPARISON:  CT chest 07/20/2014  FINDINGS: Small filling defects in the lower lobe pulmonary arteries are unchanged from the prior study and consistent with residual pulmonary emboli which are not dissolved. No new emboli identified. It is unlikely these are recurrent emboli as  the are in the exact same location as the prior study.  Cardiac enlargement without pericardial effusion. Right ventricular enlargement. RV/LV ratio is 1.5 suggesting right heart strain. Pulmonary arteries are enlarged.  Port-A-Cath in the SVC unchanged.  Because  Anterior mediastinal adenopathy measuring up to 12 mm  unchanged. No hilar adenopathy.  Patchy airspace densities in the posterior lung bases bilaterally unchanged most consistent with atelectasis or scarring. Additional ground-glass density in the lungs bilaterally most notably in the right lung are unchanged and may be related to chronic lung disease or pulmonary emboli.  Upper abdomen negative.  Negative for   effusion.  Review of the MIP images confirms the above findings.  IMPRESSION: Small filling defects in the lower lobe pulmonary arteries unchanged from the CT of 07/20/2014 compatible with residual emboli which have not dissolved. No new emboli identified.  Cardiac enlargement. Pulmonary artery enlargement. Enlarged right ventricle. RV/LV ratio 1.5 consistent with right ventricle strain likely due to pulmonary hypertension.  Critical Value/emergent results were called by telephone at the time of interpretation on 08/29/2014 at 10:28 am to Dr. Gareth Morgan , who verbally acknowledged these results.   Electronically Signed   By: Franchot Gallo M.D.   On: 08/29/2014 10:28    Dg Chest 1v Repeat Same Day  08/29/2014   CLINICAL DATA:  34 year old male with shortness of breath for 1 day. History sickle cell disease.  EXAM: CHEST - 1 VIEW SAME DAY  COMPARISON:  10/21/2014 and prior radiographs  FINDINGS: Cardiomegaly and left subclavian Port-A-Cath with tip overlying the superior cavoatrial junction again noted.  Increased pulmonary vascularity noted.  There is no evidence of focal airspace disease, pulmonary edema, suspicious pulmonary nodule/mass, pleural effusion, or pneumothorax. No acute bony abnormalities are identified.  IMPRESSION:  Cardiomegaly and increased pulmonary vascularity without acute abnormality.   Electronically Signed   By: Margarette Canada M.D.   On: 08/29/2014 19:13    Scheduled Meds: . aspirin  81 mg Oral Daily  . diltiazem  30 mg Oral 4 times per day  . enoxaparin (LOVENOX) injection  110 mg Subcutaneous Q24H  . folic acid  1 mg Oral q morning - 10a  . HYDROmorphone PCA 2 mg/mL   Intravenous 6 times per day  . ketorolac  30 mg Intravenous 4 times per day  . metoprolol tartrate  25 mg Oral Daily  . morphine  30 mg Oral Q12H  . piperacillin-tazobactam (ZOSYN)  IV  3.375 g Intravenous Q8H  . potassium chloride SA  20 mEq Oral q morning - 10a  . senna-docusate  1 tablet Oral BID  . vancomycin  1,000 mg Intravenous Q8H   Continuous Infusions: . 0.9 % NaCl with KCl 40 mEq / L 50 mL/hr (09/01/14 0814)  . diltiazem (CARDIZEM) infusion Stopped (08/29/14 1800)    Time spent 60 minutes

## 2014-09-01 NOTE — Progress Notes (Signed)
CRITICAL VALUE ALERT  Critical value received:  Aerobic bottle blood culture gram + cocci in clusters and gram -coccobacilli  Date of notification:  09/01/14  Time of notification:  0830  Critical value read back:Yes.    Nurse who received alert:  Star Age  MD notified (1st page):  Dr. Zigmund Daniel  Time of first page:  [phone]  MD notified (2nd page):  Time of second page:  Responding MD: Dr Zigmund Daniel  Time MD responded:  [phone]

## 2014-09-01 NOTE — Care Management Note (Deleted)
Case Management Note  Patient Details  Name: [name] MRN: [id number] Date of Birth: 08/10/1980  Subjective/Objective:34 y/o m admitted w/Bacteremia,afib w/rvr.Readmit-7/21-7/27/16.From home w/[facility]/HHPT.                    Action/Plan:d/c plan home w/HHC.   Expected Discharge Date:   (unknown)               Expected Discharge Plan:  [place]  In-House Referral:     Discharge planning Services  CM Consult  Post Acute Care Choice:  Home Health Choice offered to:     DME Arranged:    DME Agency:     HH Arranged:    HH Agency:     Status of Service:  In process, will continue to follow  Medicare Important Message Given:  Yes-second notification given Date Medicare IM Given:    Medicare IM give by:    Date Additional Medicare IM Given:    Additional Medicare Important Message give by:     If discussed at [place] of Stay Meetings, dates discussed:    Additional Comments:  Dessa Phi, RN 09/01/2014, 4:08 PM

## 2014-09-01 NOTE — Care Management Important Message (Signed)
Important Message  Patient Details  Name: [name] MRN: [id number] Date of Birth: December 26, 1980   Medicare Important Message Given:  Yes-second notification given    Shelda Altes 09/01/2014, 2:44 [place] Message  Patient Details  Name: [name] MRN: [id number] Date of Birth: 1980-06-07   Medicare Important Message Given:  Yes-second notification given    Shelda Altes 09/01/2014, 2:43 PM

## 2014-09-01 NOTE — Progress Notes (Signed)
Orthopedic Tech Progress Note Patient Details:  [name] 09/30/1980 [id number]  Ortho Devices Type of Ortho Device: Postop shoe/boot Ortho Device/Splint Interventions: Application   Cammer, Theodoro Parma 09/01/2014, 7:36 PM

## 2014-09-01 NOTE — Care Management Note (Signed)
Case Management Note  Patient Details  Name: [name] MRN: [id number] Date of Birth: 18-May-1980  Subjective/Objective:   33 y/o m admitted w/Bacteremia.Readmit721-7/27-[facility].From home.Has home 02 uses @ night.                 Action/Plan:d/c plan home. No anticipated d/c needs.   Expected Discharge Date:   (unknown)               Expected Discharge Plan:  [place]  In-House Referral:     Discharge planning Services  CM Consult  Post Acute Care Choice:  Home Health Choice offered to:     DME Arranged:    DME Agency:     HH Arranged:    HH Agency:     Status of Service:  In process, will continue to follow  Medicare Important Message Given:  Yes-second notification given Date Medicare IM Given:    Medicare IM give by:    Date Additional Medicare IM Given:    Additional Medicare Important Message give by:     If discussed at [place] of Stay Meetings, dates discussed:    Additional Comments:  Dessa Phi, RN 09/01/2014, 4:15 PM

## 2014-09-01 NOTE — Consult Note (Signed)
Reason for Consult: Abscess left great toe Referring Physician: Dr. Sylvester Harder is an 34 y.o. male.  HPI: Patient is a 34 year old gentleman who has sickle cell anemia was admitted with bacteremia and is seen in consultation for a 2 day history of pain and swelling and fluctuance at the base of left great toe nail.  Past Medical History  Diagnosis Date  . Sickle cell anemia   . Blood transfusion   . Acute embolism and thrombosis of right internal jugular vein   . Hypokalemia   . Mood disorder   . History of pulmonary embolus (PE)   . Avascular necrosis   . Leukocytosis     Chronic  . Thrombocytosis     Chronic  . Hypertension   . History of Clostridium difficile infection   . Uses marijuana   . Chronic anticoagulation   . Functional asplenia   . Former smoker   . Second hand tobacco smoke exposure   . Alcohol consumption of one to four drinks per day   . Noncompliance with medication regimen   . Sickle-cell crisis with associated acute chest syndrome 05/13/2013  . Acute chest syndrome 06/18/2013  . Demand ischemia 01/02/2014    Past Surgical History  Procedure Laterality Date  . Right hip replacement      08/2006  . Cholecystectomy      01/2008  . Porta cath placement    . Porta cath removal    . Umbilical hernia repair      01/2008  . Excision of left periauricular cyst      10/2009  . Excision of right ear lobe cyst with primary closur      11/2007  . Portacath placement  01/05/2012    Procedure: INSERTION PORT-A-CATH;  Surgeon: Odis Hollingshead, MD;  Location: [facility];  Service: General;  Laterality: N/A;  ultrasound guiced port a cath insertion with fluoroscopy    Family History  Problem Relation Age of Onset  . Sickle cell trait Mother   . Depression Mother   . Diabetes Mother   . Sickle cell trait Father   . Sickle cell trait Brother     Social History:  reports that he quit smoking about 4 years ago. He has never used smokeless tobacco. He  reports that he uses illicit drugs (Marijuana) about twice per week. He reports that he does not drink alcohol.  Allergies: No Known Allergies  Medications: I have reviewed the patient's current medications.  Results for orders placed or performed during the hospital encounter of 08/29/14 (from the past 48 hour(s))  CBC with Differential/Platelet     Status: Abnormal   Collection Time: 08/31/14  4:55 AM  Result Value Ref Range   WBC 20.6 (H) 4.0 - 10.5 K/uL    Comment: ADJUSTED FOR NUCLEATED RBC'S   RBC 2.34 (L) 4.22 - 5.81 MIL/uL   Hemoglobin 7.6 (L) 13.0 - 17.0 g/dL   HCT 22.4 (L) 39.0 - 52.0 %   MCV 95.7 78.0 - 100.0 fL   MCH 32.5 26.0 - 34.0 pg   MCHC 33.9 30.0 - 36.0 g/dL   RDW 23.3 (H) 11.5 - 15.5 %   Platelets 445 (H) 150 - 400 K/uL   Neutrophils Relative % 75 43 - 77 %   Lymphocytes Relative 3 (L) 12 - 46 %   Monocytes Relative 22 (H) 3 - 12 %   Eosinophils Relative 0 0 - 5 %   Basophils Relative 0 0 -  1 %   Band Neutrophils 0 0 - 10 %   Metamyelocytes Relative 0 %   Myelocytes 0 %   Promyelocytes Absolute 0 %   Blasts 0 %   nRBC 34 (H) 0 /100 WBC   Neutro Abs 15.5 (H) 1.7 - 7.7 K/uL   Lymphs Abs 0.6 (L) 0.7 - 4.0 K/uL   Monocytes Absolute 4.5 (H) 0.1 - 1.0 K/uL   Eosinophils Absolute 0.0 0.0 - 0.7 K/uL   Basophils Absolute 0.0 0.0 - 0.1 K/uL   RBC Morphology POLYCHROMASIA PRESENT     Comment: TARGET CELLS SICKLE CELLS    Smear Review LARGE PLATELETS PRESENT   Comprehensive metabolic panel     Status: Abnormal   Collection Time: 08/31/14  4:55 AM  Result Value Ref Range   Sodium 138 135 - 145 mmol/L   Potassium 4.3 3.5 - 5.1 mmol/L    Comment: RESULT REPEATED AND VERIFIED DELTA CHECK NOTED NO VISIBLE HEMOLYSIS    Chloride 111 101 - 111 mmol/L   CO2 23 22 - 32 mmol/L   Glucose, Bld 104 (H) 65 - 99 mg/dL   BUN 7 6 - 20 mg/dL   Creatinine, Ser 0.62 0.61 - 1.24 mg/dL   Calcium 7.9 (L) 8.9 - 10.3 mg/dL   Total Protein 6.9 6.5 - 8.1 g/dL   Albumin 3.6 3.5 -  5.0 g/dL   AST 21 15 - 41 U/L   ALT 22 17 - 63 U/L   Alkaline Phosphatase 65 38 - 126 U/L   Total Bilirubin 4.7 (H) 0.3 - 1.2 mg/dL   GFR calc non Af Amer >60 >60 mL/min   GFR calc Af Amer >60 >60 mL/min    Comment: (NOTE) The eGFR has been calculated using the CKD EPI equation. This calculation has not been validated in all clinical situations. eGFR's persistently <60 mL/min signify possible Chronic Kidney Disease.    Anion gap 4 (L) 5 - 15  Vancomycin, trough     Status: None   Collection Time: 08/31/14  2:45 PM  Result Value Ref Range   Vancomycin Tr 18 10.0 - 20.0 ug/mL  CBC with Differential/Platelet     Status: Abnormal   Collection Time: 09/01/14  6:10 AM  Result Value Ref Range   WBC 16.8 (H) 4.0 - 10.5 K/uL    Comment: WHITE COUNT CONFIRMED ON SMEAR ADJUSTED FOR NUCLEATED RBC'S    RBC 2.29 (L) 4.22 - 5.81 MIL/uL   Hemoglobin 7.4 (L) 13.0 - 17.0 g/dL   HCT 22.0 (L) 39.0 - 52.0 %   MCV 96.1 78.0 - 100.0 fL   MCH 32.3 26.0 - 34.0 pg   MCHC 33.6 30.0 - 36.0 g/dL   RDW 22.9 (H) 11.5 - 15.5 %   Platelets 448 (H) 150 - 400 K/uL    Comment: LARGE PLATELETS PRESENT PLATELET COUNT CONFIRMED BY SMEAR    Neutrophils Relative % 69 43 - 77 %   Lymphocytes Relative 17 12 - 46 %   Monocytes Relative 8 3 - 12 %   Eosinophils Relative 6 (H) 0 - 5 %   Basophils Relative 0 0 - 1 %   Band Neutrophils 0 0 - 10 %   Metamyelocytes Relative 0 %   Myelocytes 0 %   Promyelocytes Absolute 0 %   Blasts 0 %   nRBC 32 (H) 0 /100 WBC   Other 0 %   Neutro Abs 11.6 (H) 1.7 - 7.7 K/uL   Lymphs Abs 2.9  0.7 - 4.0 K/uL   Monocytes Absolute 1.3 (H) 0.1 - 1.0 K/uL   Eosinophils Absolute 1.0 (H) 0.0 - 0.7 K/uL   Basophils Absolute 0.0 0.0 - 0.1 K/uL   RBC Morphology POLYCHROMASIA PRESENT     Comment: TARGET CELLS HOWELL/JOLLY BODIES SICKLE CELLS   Comprehensive metabolic panel     Status: Abnormal   Collection Time: 09/01/14  6:10 AM  Result Value Ref Range   Sodium 140 135 - 145 mmol/L    Potassium 4.4 3.5 - 5.1 mmol/L   Chloride 111 101 - 111 mmol/L   CO2 22 22 - 32 mmol/L   Glucose, Bld 104 (H) 65 - 99 mg/dL   BUN 8 6 - 20 mg/dL   Creatinine, Ser 0.61 0.61 - 1.24 mg/dL   Calcium 8.7 (L) 8.9 - 10.3 mg/dL   Total Protein 7.0 6.5 - 8.1 g/dL   Albumin 3.5 3.5 - 5.0 g/dL   AST 22 15 - 41 U/L   ALT 18 17 - 63 U/L   Alkaline Phosphatase 70 38 - 126 U/L   Total Bilirubin 4.0 (H) 0.3 - 1.2 mg/dL   GFR calc non Af Amer >60 >60 mL/min   GFR calc Af Amer >60 >60 mL/min    Comment: (NOTE) The eGFR has been calculated using the CKD EPI equation. This calculation has not been validated in all clinical situations. eGFR's persistently <60 mL/min signify possible Chronic Kidney Disease.    Anion gap 7 5 - 15  Uric acid     Status: None   Collection Time: 09/01/14  6:10 AM  Result Value Ref Range   Uric Acid, Serum 5.0 4.4 - 7.6 mg/dL  Troponin I     Status: Abnormal   Collection Time: 09/01/14 12:23 PM  Result Value Ref Range   Troponin I 0.47 (H) <0.031 ng/mL    Comment:        PERSISTENTLY INCREASED TROPONIN VALUES IN THE RANGE OF 0.04-0.49 ng/mL CAN BE SEEN IN:       -UNSTABLE ANGINA       -CONGESTIVE HEART FAILURE       -MYOCARDITIS       -CHEST TRAUMA       -ARRYHTHMIAS       -LATE PRESENTING MYOCARDIAL INFARCTION       -COPD   CLINICAL FOLLOW-UP RECOMMENDED.   Culture, blood (routine x 2)     Status: None (Preliminary result)   Collection Time: 09/01/14  1:40 PM  Result Value Ref Range   Specimen Description      BLOOD RIGHT HAND Performed at [facility]    Special Requests IN PEDIATRIC BOTTLE 4CC    Culture PENDING    Report Status PENDING   Culture, blood (routine x 2)     Status: None (Preliminary result)   Collection Time: 09/01/14  1:50 PM  Result Value Ref Range   Specimen Description      BLOOD RIGHT ARM Performed at [facility]    Special Requests IN PEDIATRIC BOTTLE 4CC    Culture PENDING    Report Status PENDING     *Note: Due to a large number of results and/or encounters for the requested time period, some results have not been displayed. A complete set of results can be found in Results Review.    No results found.  Review of Systems  All other systems reviewed and are negative.  Blood pressure 128/85, pulse 80, temperature 98.9 F (37.2 C), temperature  source Oral, resp. rate 20, height 6' (1.829 m), weight 71.9 kg (158 lb 8.2 oz), SpO2 93 %. Physical Exam On examination patient is a good dorsalis pedis pulse. There is no ascending cellulitis no adenopathy no pain around the ankle or midfoot. Patient has no history of gout. On examination toe patient has a paronychial infection which is quite large fluctuant and tender to palpation. Assessment/Plan: Assessment: A large fluctuant paronychial infection left great toenail.  Plan: After informed consent and sterile prepping with Betadine. Patient underwent a digital block with 10 mL of 1% lidocaine plain. After adequate levels of anesthesia were obtained the paronychial infection was opened there was a large amount of purulence with the patient being on antibiotics no cultures were sent. This was completely decompressed and further prepped with Betadine. The lateral border of the nail was excised to further decompress this area. There is good beefy granulation tissue no signs of involvement with the bone. The wound was covered with a sterile dressing. Begin Bactroban dressing changes tomorrow he may be weightbearing as tolerated. I'll follow-up in the office in 2 weeks.  Akaysha Cobern V 09/01/2014, 5:50 PM

## 2014-09-02 DIAGNOSIS — L03012 Cellulitis of left finger: Secondary | ICD-10-CM

## 2014-09-02 LAB — GLUCOSE, CAPILLARY
GLUCOSE-CAPILLARY: 105 mg/dL — AB (ref 65–99)
GLUCOSE-CAPILLARY: 114 mg/dL — AB (ref 65–99)
Glucose-Capillary: 132 mg/dL — ABNORMAL HIGH (ref 65–99)

## 2014-09-02 LAB — CBC WITH DIFFERENTIAL/PLATELET
BLASTS: 0 %
Band Neutrophils: 0 % (ref 0–10)
Basophils Absolute: 0.2 10*3/uL — ABNORMAL HIGH (ref 0.0–0.1)
Basophils Relative: 1 % (ref 0–1)
EOS ABS: 1.1 10*3/uL — AB (ref 0.0–0.7)
EOS PCT: 6 % — AB (ref 0–5)
HCT: 20.5 % — ABNORMAL LOW (ref 39.0–52.0)
Hemoglobin: 7 g/dL — ABNORMAL LOW (ref 13.0–17.0)
LYMPHS ABS: 2.8 10*3/uL (ref 0.7–4.0)
LYMPHS PCT: 16 % (ref 12–46)
MCH: 32.7 pg (ref 26.0–34.0)
MCHC: 34.1 g/dL (ref 30.0–36.0)
MCV: 95.8 fL (ref 78.0–100.0)
METAMYELOCYTES PCT: 0 %
MYELOCYTES: 0 %
Monocytes Absolute: 1.6 10*3/uL — ABNORMAL HIGH (ref 0.1–1.0)
Monocytes Relative: 9 % (ref 3–12)
NEUTROS ABS: 11.8 10*3/uL — AB (ref 1.7–7.7)
Neutrophils Relative %: 68 % (ref 43–77)
Other: 0 %
PLATELETS: 410 10*3/uL — AB (ref 150–400)
PROMYELOCYTES ABS: 0 %
RBC: 2.14 MIL/uL — ABNORMAL LOW (ref 4.22–5.81)
RDW: 21.5 % — AB (ref 11.5–15.5)
WBC: 17.5 10*3/uL — AB (ref 4.0–10.5)
nRBC: 12 /100 WBC — ABNORMAL HIGH

## 2014-09-02 LAB — RETICULOCYTES
RBC.: 2.14 MIL/uL — AB (ref 4.22–5.81)
Retic Count, Absolute: 184 10*3/uL (ref 19.0–186.0)
Retic Ct Pct: 8.6 % — ABNORMAL HIGH (ref 0.4–3.1)

## 2014-09-02 MED ORDER — HYDROMORPHONE HCL 2 MG PO TABS
4.0000 mg | ORAL_TABLET | ORAL | Status: DC
  Administered 2014-09-02 – 2014-09-03 (×7): 4 mg via ORAL
  Filled 2014-09-02 (×7): qty 2

## 2014-09-02 NOTE — Progress Notes (Signed)
ANTICOAGULATION CONSULT NOTE - Follow Up Consult  Pharmacy Consult for lovenox Indication: hx PEs  No Known Allergies  Patient Measurements: Height: 6' (182.9 cm) Weight: 158 lb 8.2 oz (71.9 kg) IBW/kg (Calculated) : 77.6  Vital Signs: Temp: 98.7 F (37.1 C) (08/02 0520) Temp Source: Oral (08/02 0520) BP: 124/78 mmHg (08/02 0520) Pulse Rate: 80 (08/02 0520)  Labs:  Recent Labs  08/31/14 0455 09/01/14 0610 09/01/14 1223  HGB 7.6* 7.4*  --   HCT 22.4* 22.0*  --   PLT 445* 448*  --   CREATININE 0.62 0.61  --   TROPONINI  --   --  0.47*    Estimated Creatinine Clearance: 131.1 mL/min (by C-G formula based on Cr of 0.61).  Assessment: Patient's a 34 y.o M with hx recurrent PEs currently on on home lovenox dose of 110 mg SQ q24h.  Hgb is low but stable, plt high at 448, scr 0.61 (crcl>100). No bleeding documented.  Goal of Therapy:  Anti-Xa level 1-2 Monitor platelets by anticoagulation protocol: Yes   Plan:  - continue current lovenox regimen - pharmacy will sign off but will follow patient peripherally and adjust dose if crcl<30 or plt decrease to less than 50% from baseline  Shmiel Morton P 09/02/2014,9:14 AM

## 2014-09-02 NOTE — Progress Notes (Signed)
SICKLE CELL SERVICE PROGRESS NOTE  BENEDICTO CAPOZZI OBS:[id number] DOB: 1980-09-28 DOA: 08/29/2014 PCP: [name], MD  Assessment/Plan: Active Problems:   Pulmonary HTN   Cor pulmonale, chronic   Atrial fibrillation with RVR   Chronic anticoagulation   Hb-SS disease with crisis   Hypoxia   Anemia of chronic disease   Elevated troponin I level   Hypokalemia  1. Bacteremia: BC show GPC in clusters and gram negative coccobacilli in 1/2 bottles. I appreciate ID input and recommendations.  He is currently on Day #5 of Vancomycin and Zosyn. Will continue. Pt has an in-dwelling port and this may need to be removed.  2. Paronychial Infection left great toe:  Appreciate I&D by Dr. Sharol Given. Per his recommendations dressing changes BID with Bactorban and dry dressing. Pt has ortho shoe and is to demonstrate ambulation with nurses today. 3. Hypoxemia: Pt has had a chronic hypoxemia with oxygen requirements fluctuating from 0-3 l/min. He is currently requiring 3 l/min of oxygen. Will attempt to wean Oxygen.  4. A-fib with RVR: Now resolved. Pt in sinus rhythm with PVC's. He is off Cardizem gtt and on short acting Cardizem. Will change to Cardizem CD 120 mg daily. Continue Meoprolol. 5. Hb SS with crisis: Pt reports pain decrease to 4/10. He has used 47.6mg  with 72/68:demands/deliveries and an additional 12 mg in clinician assisted doses. I will discontinue clinician assisted doses, schedule oral Dilaudid every 4 hours and continue PCA at current dose, MS Contin and Ketorolac. 6. 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 His pulmonary pressures are markedly increased and thus pre-load needs to be maintained to allow for adequate perfusion of organs. 7. Leukocytosis: Suspect multifactorial due to bacteremia, crisis and increased bone turnover. 8. Anemia: Pt has an anemia of chronic disease. He received a transfusion of 1  unit RBC's during this admission. Labs from today are pending. Will continue to monitor. 9. Chronic pain: Continue MS Contin. Will add Neurontin 300 mg daily.then escalate dose. 10. Hypokalemia: treated and resolved. 11. Elevated Troponin: Felt to be secondary to demand. Ttroponin from yesterday shows a  continued trend downwards. 12. Chronic anticoagulation: Pt has a h/o multiple PE's . He is on chronic anticoagulation with Lovenox. Continue.   Family Communication: N/A Disposition Plan: Not yet ready for discharge  [place].  Pager [phone]. If 7PM-7AM, please contact night-coverage.  09/02/2014, 10:25 AM  LOS: 4 days    Consultants:  Dr. Baxter Flattery- ID  Dr. Sharol Given- Orthopedic Surgery  Procedures:  None  Antibiotics:  Vancomycin 7/29 >>  Zosyn 7/29 >>  HPI/Subjective: Pt states that pain the pain in his left thigh and is currently 4/10. However the pain in the left great toe at the site of the I&D is about 7/10. Last BM 2 days ago. Tolerating diet well. Objective: Filed Vitals:   09/02/14 0140 09/02/14 0400 09/02/14 0520 09/02/14 0758  BP: 130/88  124/78   Pulse: 82  80   Temp: 98.3 F (36.8 C)  98.7 F (37.1 C)   TempSrc: Oral  Oral   Resp: 16 17 17 13   Height:      Weight:      SpO2: 93% 95% 90% 94%   Weight change:   Intake/Output Summary (Last 24 hours) at 09/02/14 1025 Last data filed at 09/02/14 0700  Gross per 24 hour  Intake 2835.83 ml  Output   2275 ml  Net 560.83 ml  General: Alert, awake, oriented x3, in no acute distress.  HEENT: [place]/AT PEERL, EOMI, anicteric Neck: Trachea midline,  no masses, no thyromegal,y no JVD, no carotid bruit OROPHARYNX:  Moist, No exudate/ erythema/lesions.  Heart: Regular rate and rhythm, without murmurs, rubs, gallops, PMI non-displaced, no heaves or thrills on palpation.  Lungs: Clear to auscultation, no wheezing or rhonchi noted. No increased vocal fremitus resonant to percussion  Abdomen: Soft, nontender,  nondistended, positive bowel sounds, no masses no hepatosplenomegaly noted..  Neuro: No focal neurological deficits noted cranial nerves II through XII grossly intact. DTRs 2+ bilaterally upper and lower extremities. Strength 5 out of 5 in bilateral upper and lower extremities. Musculoskeletal: Left foot with dressing applied and unable to evaluate. However patient has good pedal pulse both on Dorsalis Pedis and Posterior tibial pulses. No spinal tenderness noted.  Data Reviewed: Basic Metabolic Panel:  Recent Labs Lab 08/29/14 0810 08/30/14 0540 08/31/14 0455 09/01/14 0610  NA 138 140 138 140  K 3.3* 3.4* 4.3 4.4  CL 106 108 111 111  CO2 22 24 23 22   GLUCOSE 131* 108* 104* 104*  BUN <5* <5* 7 8  CREATININE 0.53* 0.62 0.62 0.61  CALCIUM 9.6 8.6* 7.9* 8.7*  MG 1.6*  --   --   --    Liver Function Tests:  Recent Labs Lab 08/29/14 0810 08/31/14 0455 09/01/14 0610  AST 45* 21 22  ALT 35 22 18  ALKPHOS 99 65 70  BILITOT 8.7* 4.7* 4.0*  PROT 8.9* 6.9 7.0  ALBUMIN 4.6 3.6 3.5   No results for input(s): LIPASE, AMYLASE in the last 168 hours. No results for input(s): AMMONIA in the last 168 hours. CBC:  Recent Labs Lab 08/29/14 0810 08/29/14 1325 08/29/14 2100 08/30/14 0540 08/31/14 0455 09/01/14 0610  WBC 19.0*  --  21.3* 21.7* 20.6* 16.8*  NEUTROABS 14.6*  --  16.0* 16.3* 15.5* 11.6*  HGB 9.3* 6.9* 8.0* 7.5* 7.6* 7.4*  HCT 27.0* 20.8* 23.6* 22.8* 22.4* 22.0*  MCV 90.9  --  92.2 93.1 95.7 96.1  PLT 468*  --  456* 440* 445* 448*   Cardiac Enzymes:  Recent Labs Lab 08/29/14 1015 08/29/14 2100 08/30/14 08/30/14 0826 09/01/14 1223  TROPONINI 0.50* 1.01* 0.99* 0.90* 0.47*   BNP (last 3 results)  Recent Labs  08/29/14 0810  BNP 1117.0*    ProBNP (last 3 results)  Recent Labs  01/01/14 0909  PROBNP 5790.0*    CBG:  Recent Labs Lab 09/02/14 1019  GLUCAP 105*    Recent Results (from the past 240 hour(s))  MRSA PCR Screening     Status: None    Collection Time: 08/29/14 11:35 AM  Result Value Ref Range Status   MRSA by PCR NEGATIVE NEGATIVE Final    Comment:        The GeneXpert MRSA Assay (FDA approved for NASAL specimens only), is one component of a comprehensive MRSA colonization surveillance program. It is not intended to diagnose MRSA infection nor to guide or monitor treatment for MRSA infections.   Culture, blood (routine x 2)     Status: None (Preliminary result)   Collection Time: 08/29/14  9:30 PM  Result Value Ref Range Status   Specimen Description BLOOD RIGHT WRIST  Final   Special Requests IN PEDIATRIC BOTTLE 3ML  Final   Culture  Setup Time   Final    GRAM POSITIVE COCCI IN CLUSTERS GRAM NEGATIVE COCCOBACILLI AEROBIC BOTTLE ONLY CRITICAL RESULT CALLED TO, READ BACK BY AND VERIFIED  WITH: E PELLITIER,RN AT 4128 09/01/14 BY L BENFIELD    Culture   Final    NO GROWTH 2 DAYS Performed at [facility]    Report Status PENDING  Incomplete  Culture, blood (routine x 2)     Status: None (Preliminary result)   Collection Time: 08/29/14  9:39 PM  Result Value Ref Range Status   Specimen Description BLOOD BLOOD RIGHT HAND  Final   Special Requests IN PEDIATRIC BOTTLE 3ML  Final   Culture   Final    NO GROWTH 2 DAYS Performed at [facility]    Report Status PENDING  Incomplete  Culture, blood (routine x 2)     Status: None (Preliminary result)   Collection Time: 09/01/14  1:40 PM  Result Value Ref Range Status   Specimen Description   Final    BLOOD RIGHT HAND Performed at [facility]    Special Requests IN PEDIATRIC BOTTLE 4CC  Final   Culture PENDING  Incomplete   Report Status PENDING  Incomplete  Culture, blood (routine x 2)     Status: None (Preliminary result)   Collection Time: 09/01/14  1:50 PM  Result Value Ref Range Status   Specimen Description   Final    BLOOD RIGHT ARM Performed at [facility]    Special Requests IN PEDIATRIC BOTTLE 4CC  Final   Culture  PENDING  Incomplete   Report Status PENDING  Incomplete     Studies: Dg Chest 2 View  08/29/2014   CLINICAL DATA:  Sickle cell crisis. Chest pain. Severe bilateral lower extremity pain.  EXAM: CHEST  2 VIEW  COMPARISON:  08/24/2014.  FINDINGS: No change. Artifact overlies the chest. Cardiac silhouette remains enlarged. Mediastinal shadows are otherwise normal. Power port inserted from the left subclavian vein has its tip in the SVC just above the right atrium. Vascular plethora remains present without edema or effusions. Pulmonary scarring remains present.  IMPRESSION: No acute finding. Cardiomegaly. Pulmonary vascular plethora. Pulmonary scarring.   Electronically Signed   By: Nelson Chimes M.D.   On: 08/29/2014 08:49    Ct Angio Chest Pe W/cm &/or Wo Cm  08/29/2014   CLINICAL DATA:  Chest pain.  Sickle cell.  Recent pulmonary embolism  EXAM: CT ANGIOGRAPHY CHEST WITH CONTRAST  TECHNIQUE: Multidetector CT imaging of the chest was performed using the standard protocol during bolus administration of intravenous contrast. Multiplanar CT image reconstructions and MIPs were obtained to evaluate the vascular anatomy.  CONTRAST:  114mL OMNIPAQUE IOHEXOL 350 MG/ML SOLN  COMPARISON:  CT chest 07/20/2014  FINDINGS: Small filling defects in the lower lobe pulmonary arteries are unchanged from the prior study and consistent with residual pulmonary emboli which are not dissolved. No new emboli identified. It is unlikely these are recurrent emboli as the are in the exact same location as the prior study.  Cardiac enlargement without pericardial effusion. Right ventricular enlargement. RV/LV ratio is 1.5 suggesting right heart strain. Pulmonary arteries are enlarged.  Port-A-Cath in the SVC unchanged.  Because  Anterior mediastinal adenopathy measuring up to 12 mm unchanged. No hilar adenopathy.  Patchy airspace densities in the posterior lung bases bilaterally unchanged most consistent with atelectasis or scarring.  Additional ground-glass density in the lungs bilaterally most notably in the right lung are unchanged and may be related to chronic lung disease or pulmonary emboli.  Upper abdomen negative.  Negative for   effusion.  Review of the MIP images confirms the above findings.  IMPRESSION: Small filling defects in the lower lobe pulmonary arteries unchanged from the CT of 07/20/2014 compatible with residual emboli which have not dissolved. No new emboli identified.  Cardiac enlargement. Pulmonary artery enlargement. Enlarged right ventricle. RV/LV ratio 1.5 consistent with right ventricle strain likely due to pulmonary hypertension.  Critical Value/emergent results were called by telephone at the time of interpretation on 08/29/2014 at 10:28 am to Dr. Gareth Morgan , who verbally acknowledged these results.   Electronically Signed   By: Franchot Gallo M.D.   On: 08/29/2014 10:28    Dg Chest 1v Repeat Same Day  08/29/2014   CLINICAL DATA:  34 year old male with shortness of breath for 1 day. History sickle cell disease.  EXAM: CHEST - 1 VIEW SAME DAY  COMPARISON:  10/21/2014 and prior radiographs  FINDINGS: Cardiomegaly and left subclavian Port-A-Cath with tip overlying the superior cavoatrial junction again noted.  Increased pulmonary vascularity noted.  There is no evidence of focal airspace disease, pulmonary edema, suspicious pulmonary nodule/mass, pleural effusion, or pneumothorax. No acute bony abnormalities are identified.  IMPRESSION: Cardiomegaly and increased pulmonary vascularity without acute abnormality.   Electronically Signed   By: Margarette Canada M.D.   On: 08/29/2014 19:13    Scheduled Meds: . aspirin  81 mg Oral Daily  . diltiazem  120 mg Oral Daily  . enoxaparin (LOVENOX) injection  110 mg Subcutaneous Q24H  . folic acid  1 mg Oral q morning - 10a  . HYDROmorphone PCA 2 mg/mL   Intravenous 6 times per day  . ketorolac  30 mg Intravenous 4 times per day  . lidocaine  10 mL Infiltration Once  .  metoprolol tartrate  25 mg Oral Daily  . morphine  30 mg Oral Q12H  . mupirocin ointment   Topical BID  . piperacillin-tazobactam (ZOSYN)  IV  3.375 g Intravenous Q8H  . potassium chloride SA  20 mEq Oral q morning - 10a  . senna-docusate  1 tablet Oral BID  . vancomycin  1,000 mg Intravenous Q8H   Continuous Infusions: . 0.9 % NaCl with KCl 40 mEq / L 50 mL/hr (09/01/14 0814)    Time spent 30 minutes

## 2014-09-03 DIAGNOSIS — L03032 Cellulitis of left toe: Secondary | ICD-10-CM

## 2014-09-03 LAB — BASIC METABOLIC PANEL
ANION GAP: 3 — AB (ref 5–15)
BUN: 7 mg/dL (ref 6–20)
CO2: 24 mmol/L (ref 22–32)
Calcium: 8.6 mg/dL — ABNORMAL LOW (ref 8.9–10.3)
Chloride: 108 mmol/L (ref 101–111)
Creatinine, Ser: 0.52 mg/dL — ABNORMAL LOW (ref 0.61–1.24)
GFR calc non Af Amer: >60 mL/min (ref >60)
Glucose, Bld: 110 mg/dL — ABNORMAL HIGH (ref 65–99)
Potassium: 4.1 mmol/L (ref 3.5–5.1)
Sodium: 135 mmol/L (ref 135–145)

## 2014-09-03 LAB — GLUCOSE, CAPILLARY: Glucose-Capillary: 102 mg/dL — ABNORMAL HIGH (ref 65–99)

## 2014-09-03 MED ORDER — HEPARIN SOD (PORK) LOCK FLUSH 100 UNIT/ML IV SOLN
500.0000 [IU] | INTRAVENOUS | Status: AC | PRN
  Administered 2014-09-03: 500 [IU]

## 2014-09-03 MED ORDER — AMOXICILLIN-POT CLAVULANATE 875-125 MG PO TABS: 1.0000 | ORAL_TABLET | Freq: Two times a day (BID) | ORAL | Status: DC

## 2014-09-03 NOTE — Progress Notes (Addendum)
Waste 66mL of High concentration dilaudid from PCA pump. Shirlee Limerick, RN witnessed waste.  Cathie Hoops, RN

## 2014-09-03 NOTE — Progress Notes (Signed)
    [facility] for Infectious Disease    Date of Admission:  08/29/2014   Total days of antibiotics 6        Day 6 vanco        Day 6 piptazo           ID: [name] is a 34 y.o. male with sickle, admitted for dehydration, afib with RVR, found to have polymicrobrial + blood cx but also left great toe parynochia Active Problems:   Pulmonary HTN   Chronic anticoagulation   Hb-SS disease with crisis   Hypoxia   Anemia of chronic disease   Cor pulmonale, chronic   Elevated troponin I level   Atrial fibrillation with RVR   Hypokalemia    Subjective: Afebrile, PPD#2 from I x D of great toe  Medications:  . aspirin  81 mg Oral Daily  . diltiazem  120 mg Oral Daily  . enoxaparin (LOVENOX) injection  110 mg Subcutaneous Q24H  . folic acid  1 mg Oral q morning - 10a  . HYDROmorphone PCA 2 mg/mL   Intravenous 6 times per day  . HYDROmorphone  4 mg Oral Q4H  . ketorolac  30 mg Intravenous 4 times per day  . lidocaine  10 mL Infiltration Once  . metoprolol tartrate  25 mg Oral Daily  . morphine  30 mg Oral Q12H  . mupirocin ointment   Topical BID  . piperacillin-tazobactam (ZOSYN)  IV  3.375 g Intravenous Q8H  . potassium chloride SA  20 mEq Oral q morning - 10a  . senna-docusate  1 tablet Oral BID  . vancomycin  1,000 mg Intravenous Q8H    Objective: Vital signs in last 24 hours: Temp:  [98.8 F (37.1 C)-99.7 F (37.6 C)] 98.8 F (37.1 C) (08/03 1420) Pulse Rate:  [74-83] 75 (08/03 1420) Resp:  [12-20] 14 (08/03 1456) BP: (108-126)/(70-88) 126/88 mmHg (08/03 1420) SpO2:  [91 %-97 %] 95 % (08/03 1456)   Lab Results  Recent Labs  09/01/14 0610 09/02/14 0956 09/03/14 0955  WBC 16.8* 17.5*  --   HGB 7.4* 7.0*  --   HCT 22.0* 20.5*  --   NA 140  --  135  K 4.4  --  4.1  CL 111  --  108  CO2 22  --  24  BUN 8  --  7  CREATININE 0.61  --  0.52*   Liver Panel  Recent Labs  09/01/14 0610  PROT 7.0  ALBUMIN 3.5  AST 22  ALT 18  ALKPHOS 70    BILITOT 4.0*    Assessment/Plan: Polymicrobial blood cx + = likely contaminant since only 1 of 4 sets. At day 5 of culture, not further isolated than gram negative coccobacilli, ID still pending. Has received 6 days of vancomycin plus piptazo. No need to remove port  parynochia = had received broad spectrum plus debridement. He has no hx of mrsa infection or PsA. Finish out course with 5 days of amox/clav to finish 10 day course of therapy  Baxter Flattery [facility] Cell: [phone] Pager: [phone]  09/03/2014, 3:08 PM

## 2014-09-03 NOTE — Discharge Summary (Signed)
[name] MRN: [id number] DOB/AGE: [birth year] 34 y.o.  Admit date: 08/29/2014 Discharge date: 09/03/2014  Primary Care Physician:  [name], MD   Discharge Diagnoses:   Patient Active Problem List   Diagnosis Date Noted  . Atrial fibrillation with RVR 08/29/2014    Priority: High  . Cor pulmonale, chronic 08/25/2014    Priority: High  . Pulmonary HTN 06/18/2013    Priority: High  . Functional asplenia     Priority: High  . Anemia of chronic disease 06/25/2014    Priority: Medium  . Hypoxia 03/14/2014    Priority: Medium  . Hb-SS disease with crisis 01/22/2014    Priority: Medium  . Chronic anticoagulation 08/22/2013    Priority: Medium  . Avascular necrosis     Priority: Low  . Elevated troponin I level 08/29/2014  . Hypokalemia 08/29/2014  . Ankle edema 07/07/2014  . Sickle cell anemia 06/25/2014  . PAH (pulmonary artery hypertension) 03/18/2014  . Paralytic strabismus, external ophthalmoplegia   . Chronic pain syndrome 12/12/2013  . Essential hypertension 08/22/2013  . Vitamin D deficiency 02/13/2013  . Uses marijuana 01/13/2013  . Embolism, pulmonary with infarction 07/09/2012  . Hx of pulmonary embolus 06/29/2012  . Hemochromatosis 12/14/2011    DISCHARGE MEDICATION:   Medication List    TAKE these medications        amoxicillin-clavulanate 875-125 MG per tablet  Commonly known as:  AUGMENTIN  Take 1 tablet by mouth 2 (two) times daily.     aspirin 81 MG chewable tablet  Chew 1 tablet (81 mg total) by mouth daily.     diltiazem 120 MG 24 hr capsule  Commonly known as:  CARDIZEM CD  Take 1 capsule (120 mg total) by mouth daily.     enoxaparin 150 MG/ML injection  Commonly known as:  LOVENOX  Inject 0.73 mLs (110 mg total) into the skin daily. Starting tonight 07/23/2014 at 09:47SJ     folic acid 1 MG tablet  Commonly known as:  FOLVITE  Take 1 tablet (1 mg total) by mouth every morning.     HYDROmorphone 4 MG tablet  Commonly  known as:  DILAUDID  Take 1 tablet (4 mg total) by mouth every 4 (four) hours as needed for severe pain.     hydroxyurea 500 MG capsule  Commonly known as:  HYDREA  Take 3 capsules (1,500 mg total) by mouth daily. May take with food to minimize GI side effects.     lisinopril 10 MG tablet  Commonly known as:  PRINIVIL,ZESTRIL  Take 1 tablet (10 mg total) by mouth daily.     metoprolol tartrate 25 MG tablet  Commonly known as:  LOPRESSOR  Take 1 tablet (25 mg total) by mouth daily.     morphine 30 MG 12 hr tablet  Commonly known as:  MS CONTIN  Take 1 tablet (30 mg total) by mouth every 12 (twelve) hours.     mupirocin ointment 2 %  Commonly known as:  BACTROBAN  Apply 1 application topically 2 (two) times daily. Apply ointment to left great toe twice a day and apply dry dressing     potassium chloride SA 20 MEQ tablet  Commonly known as:  K-DUR,KLOR-CON  Take 1 tablet (20 mEq total) by mouth every morning.     Vitamin D 2000 UNITS tablet  Take 1 tablet (2,000 Units total) by mouth daily.     zolpidem 10 MG tablet  Commonly known as:  AMBIEN  Take 1  tablet (10 mg total) by mouth at bedtime as needed for sleep.          Consults: Treatment Team:  Sickle Cell Rounding, MD   SIGNIFICANT DIAGNOSTIC STUDIES:  Dg Chest 2 View  08/29/2014   CLINICAL DATA:  Sickle cell crisis. Chest pain. Severe bilateral lower extremity pain.  EXAM: CHEST  2 VIEW  COMPARISON:  08/24/2014.  FINDINGS: No change. Artifact overlies the chest. Cardiac silhouette remains enlarged. Mediastinal shadows are otherwise normal. Power port inserted from the left subclavian vein has its tip in the SVC just above the right atrium. Vascular plethora remains present without edema or effusions. Pulmonary scarring remains present.  IMPRESSION: No acute finding. Cardiomegaly. Pulmonary vascular plethora. Pulmonary scarring.   Electronically Signed   By: Nelson Chimes M.D.   On: 08/29/2014 08:49   Dg Chest 2  View  08/24/2014   CLINICAL DATA:  Sickle cell crisis, shortness of breath  EXAM: CHEST  2 VIEW  COMPARISON:  08/05/2014  FINDINGS: Moderate enlargement of the cardiac silhouette is reidentified. Diffusely prominent interstitial markings are stable with areas of linear probable superimposed scarring. No pleural effusion. Cholecystectomy clips are noted. No acute osseous finding. Left-sided Port-A-Cath in place with tip at the cavoatrial junction.  IMPRESSION: No focal acute finding.   Electronically Signed   By: Conchita Paris M.D.   On: 08/24/2014 08:41   Dg Ankle Complete Left  08/24/2014   CLINICAL DATA:  Sickle cell crisis with left lower leg and ankle pain and swelling.  EXAM: LEFT ANKLE COMPLETE - 3+ VIEW  COMPARISON:  12/21/2006  FINDINGS: Diffuse soft tissue swelling is seen surrounding the left ankle. No evidence of fracture, dislocation or bony destruction. The ankle mortise shows normal alignment. No bony lesions or visible bony infarcts by x-ray. No significant degenerative changes.  IMPRESSION: Soft tissue swelling surrounding left ankle. No underlying bony abnormalities are identified.   Electronically Signed   By: Aletta Edouard M.D.   On: 08/24/2014 08:35   Ct Angio Chest Pe W/cm &/or Wo Cm  08/29/2014   CLINICAL DATA:  Chest pain.  Sickle cell.  Recent pulmonary embolism  EXAM: CT ANGIOGRAPHY CHEST WITH CONTRAST  TECHNIQUE: Multidetector CT imaging of the chest was performed using the standard protocol during bolus administration of intravenous contrast. Multiplanar CT image reconstructions and MIPs were obtained to evaluate the vascular anatomy.  CONTRAST:  162mL OMNIPAQUE IOHEXOL 350 MG/ML SOLN  COMPARISON:  CT chest 07/20/2014  FINDINGS: Small filling defects in the lower lobe pulmonary arteries are unchanged from the prior study and consistent with residual pulmonary emboli which are not dissolved. No new emboli identified. It is unlikely these are recurrent emboli as the are in the  exact same location as the prior study.  Cardiac enlargement without pericardial effusion. Right ventricular enlargement. RV/LV ratio is 1.5 suggesting right heart strain. Pulmonary arteries are enlarged.  Port-A-Cath in the SVC unchanged.  Because  Anterior mediastinal adenopathy measuring up to 12 mm unchanged. No hilar adenopathy.  Patchy airspace densities in the posterior lung bases bilaterally unchanged most consistent with atelectasis or scarring. Additional ground-glass density in the lungs bilaterally most notably in the right lung are unchanged and may be related to chronic lung disease or pulmonary emboli.  Upper abdomen negative.  Negative for   effusion.  Review of the MIP images confirms the above findings.  IMPRESSION: Small filling defects in the lower lobe pulmonary arteries unchanged from the CT of 07/20/2014 compatible with residual  emboli which have not dissolved. No new emboli identified.  Cardiac enlargement. Pulmonary artery enlargement. Enlarged right ventricle. RV/LV ratio 1.5 consistent with right ventricle strain likely due to pulmonary hypertension.  Critical Value/emergent results were called by telephone at the time of interpretation on 08/29/2014 at 10:28 am to Dr. Gareth Morgan , who verbally acknowledged these results.   Electronically Signed   By: Franchot Gallo M.D.   On: 08/29/2014 10:28   Ir Fluoro Guide Cv Line Right  08/06/2014   CLINICAL DATA:  Sickle cell  EXAM: RIGHT UPPER EXTREMITY PICC LINE PLACEMENT WITH ULTRASOUND AND FLUOROSCOPIC GUIDANCE  FLUOROSCOPY TIME:  12 seconds  PROCEDURE: The patient was advised of the possible risks and complications and agreed to undergo the procedure. The patient was then brought to the angiographic suite for the procedure.  The right arm was prepped with chlorhexidine, draped in the usual sterile fashion using maximum barrier technique (cap and mask, sterile gown, sterile gloves, large sterile sheet, hand hygiene and cutaneous  antisepsis) and infiltrated locally with 1% Lidocaine.  Ultrasound demonstrated patency of the right basilic vein, and this was documented with an image. Under real-time ultrasound guidance, this vein was accessed with a 21 gauge micropuncture needle and image documentation was performed. A 0.018 wire was introduced in to the vein. Over this, a 5 [place] double lumen power PICC was advanced to the lower SVC/right atrial junction. Fluoroscopy during the procedure and fluoro spot radiograph confirms appropriate catheter position. The catheter was flushed and covered with a sterile dressing.  COMPLICATIONS: None  LENGTH: 45 cm  IMPRESSION: Successful right arm power PICC line placement with ultrasound and fluoroscopic guidance. The catheter is ready for use.   Electronically Signed   By: Marybelle Killings M.D.   On: 08/06/2014 13:16   Ir US Guide Vasc Access Right  08/06/2014   CLINICAL DATA:  Sickle cell  EXAM: RIGHT UPPER EXTREMITY PICC LINE PLACEMENT WITH ULTRASOUND AND FLUOROSCOPIC GUIDANCE  FLUOROSCOPY TIME:  12 seconds  PROCEDURE: The patient was advised of the possible risks and complications and agreed to undergo the procedure. The patient was then brought to the angiographic suite for the procedure.  The right arm was prepped with chlorhexidine, draped in the usual sterile fashion using maximum barrier technique (cap and mask, sterile gown, sterile gloves, large sterile sheet, hand hygiene and cutaneous antisepsis) and infiltrated locally with 1% Lidocaine.  Ultrasound demonstrated patency of the right basilic vein, and this was documented with an image. Under real-time ultrasound guidance, this vein was accessed with a 21 gauge micropuncture needle and image documentation was performed. A 0.018 wire was introduced in to the vein. Over this, a 5 [place] double lumen power PICC was advanced to the lower SVC/right atrial junction. Fluoroscopy during the procedure and fluoro spot radiograph confirms appropriate  catheter position. The catheter was flushed and covered with a sterile dressing.  COMPLICATIONS: None  LENGTH: 45 cm  IMPRESSION: Successful right arm power PICC line placement with ultrasound and fluoroscopic guidance. The catheter is ready for use.   Electronically Signed   By: Marybelle Killings M.D.   On: 08/06/2014 13:16   Dg Chest 1v Repeat Same Day  08/29/2014   CLINICAL DATA:  35 year old male with shortness of breath for 1 day. History sickle cell disease.  EXAM: CHEST - 1 VIEW SAME DAY  COMPARISON:  10/21/2014 and prior radiographs  FINDINGS: Cardiomegaly and left subclavian Port-A-Cath with tip overlying the superior cavoatrial junction again noted.  Increased  pulmonary vascularity noted.  There is no evidence of focal airspace disease, pulmonary edema, suspicious pulmonary nodule/mass, pleural effusion, or pneumothorax. No acute bony abnormalities are identified.  IMPRESSION: Cardiomegaly and increased pulmonary vascularity without acute abnormality.   Electronically Signed   By: Margarette Canada M.D.   On: 08/29/2014 19:13   Dg Chest Port 1 View  08/05/2014   CLINICAL DATA:  Hypoxia.  Sickle cell pain crisis  EXAM: PORTABLE CHEST - 1 VIEW  COMPARISON:  08/03/2014  FINDINGS: Chronic cardiomegaly. Aortic and hilar contours are stable and negative. Stable left subclavian porta catheter, tip at the upper right atrium.  Chronic reticulation of lung markings consistent with scarring. There is no edema, consolidation, effusion, or pneumothorax.  No acute osseous findings.  IMPRESSION: Stable exam.  No evidence of acute cardiopulmonary disease.   Electronically Signed   By: Monte Fantasia M.D.   On: 08/05/2014 06:08      Recent Results (from the past 240 hour(s))  MRSA PCR Screening     Status: None   Collection Time: 08/29/14 11:35 AM  Result Value Ref Range Status   MRSA by PCR NEGATIVE NEGATIVE Final    Comment:        The GeneXpert MRSA Assay (FDA approved for NASAL specimens only), is one component  of a comprehensive MRSA colonization surveillance program. It is not intended to diagnose MRSA infection nor to guide or monitor treatment for MRSA infections.   Culture, blood (routine x 2)     Status: None (Preliminary result)   Collection Time: 08/29/14  9:30 PM  Result Value Ref Range Status   Specimen Description BLOOD RIGHT WRIST  Final   Special Requests IN PEDIATRIC BOTTLE 3ML  Final   Culture  Setup Time   Final    GRAM POSITIVE COCCI IN CLUSTERS GRAM NEGATIVE COCCOBACILLI AEROBIC BOTTLE ONLY CRITICAL RESULT CALLED TO, READ BACK BY AND VERIFIED WITH: E PELLITIER,RN AT 9833 09/01/14 BY L BENFIELD    Culture   Final    GRAM NEGATIVE COCCOBACILLI Performed at [facility]    Report Status PENDING  Incomplete  Culture, blood (routine x 2)     Status: None (Preliminary result)   Collection Time: 08/29/14  9:39 PM  Result Value Ref Range Status   Specimen Description BLOOD BLOOD RIGHT HAND  Final   Special Requests IN PEDIATRIC BOTTLE 3ML  Final   Culture   Final    NO GROWTH 3 DAYS Performed at [facility]    Report Status PENDING  Incomplete  Culture, blood (routine x 2)     Status: None (Preliminary result)   Collection Time: 09/01/14  1:40 PM  Result Value Ref Range Status   Specimen Description BLOOD RIGHT HAND  Final   Special Requests IN PEDIATRIC BOTTLE 4CC  Final   Culture   Final    NO GROWTH < 24 HOURS Performed at [facility]    Report Status PENDING  Incomplete  Culture, blood (routine x 2)     Status: None (Preliminary result)   Collection Time: 09/01/14  1:50 PM  Result Value Ref Range Status   Specimen Description BLOOD RIGHT ARM  Final   Special Requests IN PEDIATRIC BOTTLE 4CC  Final   Culture   Final    NO GROWTH < 24 HOURS Performed at [facility]    Report Status PENDING  Incomplete    BRIEF ADMITTING H & P: is an opiate tolerant patient with Hb  SS, Cor Pulmonale, PAH and on chronic anticoagulation for  recurrent PE who was discharged from the hospital in good condition 2 days ago after being admitted for a sickle Cell Crisis. Pt reports that he went home and was unable to adequately hydrate himself (32 oz fluid in last 24 hours) as he fell asleep from the heat.(No air-conditioning). Currently he rates his pain as 8/10 and localized to LLE, ribs and back. He describes the pain as throbbing and non-radiating in nature. He took his oral medications with little relief.  He also reports 2 episodes of nausea and small amounts of emesis which were without blood or bile. He denies fever, chills, diarrhea, chest pain SOB or DOE. However he does admit to minimal dizziness upon standing this morning.  He reports that he awoke early this morning in pain and with his "heart racing". In the ED he was found to have Atrial Fibrillation with RVR. With HR as high as 144. I have reviewed the ED records including EKG which shows A-fib with RVR, and discussed patient with Dr. Billy Fischer (ED Physician) who ordered a CT angiogram to R/O PE. I have reviewed the CT scan with radiology and there are no new findings of PE. Pt has known nocturnal hypoxemia nd is required to sleep with oxygen 2 L/min. He also has intermittent hypoxemia and at the time of discharge 2 days ago, he was saturating 92% on RA with ambulation.  His baseline Hb is 6-7 g/dl and at present his Hb is 9.3 g/dL. He has already received 1L of NS and is currently on a Cardizem gtt at 5 mg/hr and BP is marginal at 105/80 after Cardizem was started. No orthostatic vitals were obtained before fluid and Cardiazem started. Pt has a h/o pedal edema associated with Cor Pulmonale but at present has no pedal edema.   Hospital Course:  Present on Admission:   .  Atrial fibrillation with RVR: Now resolved. Pt was admitted in A-Fib with RVR. He was treated with a Cardizem drip and then converted to his pre-hospital medication regimen of Cardizem CD and Metoprolol which  maintained him in sinus rhythm with PVC's. It is felt the rate and rhythm was due to a dehydrated state as well as missed dose of medications.  . Volume Depletion: Pt was severely volume depleted on admission. He was volume rescusitated with IVF >2L and was subsequently able to maintain his hydration with oral intake.   . Bacteremia: BC showed GPC in clusters and gram negative coccobacilli in 1/2 bottles. ID was consulted and the cultures were repeated. Dr. Baxter Flattery felt that it was a contaminant and recommended Augmentin for 5 days after Pt had already received 6 days of Vancomycin and Zosyn.Pt has an in-dwelling port and consideration was given to removal in light of the bacteremia, but given the assessment of this being a contaminant it was not removed..   . Paronychial Infection left great toe: Pt was noted to have a paronychial infection to the left great toe. Dr. Sharol Given from Orthopedic surgery performed an I&D and drained purulent material. However no cultures were sent as patient had already been on Vancomycin and Zosyn for several days..Recommendations are for dressing changes BID with Bactorban and dry dressing. Pt has ortho shoe and has demonstrated ambulation with shoe and dressing changes.  . Hypoxemia: Pt has had a chronic hypoxemia with oxygen requirements fluctuating from 0-3 l/min. He is currently requiring 3 l/min of oxygen. His saturation dropped to 88% on RA  at rest, and 84% while ambulating on RA and required 3l/min to increase to 95%. Pt is discharged on Oxygen 3 L/min while ambulating and at night.  Marland Kitchen Hb-SS disease with crisis (Resolved): Pt was treated with IV Dilaudid via PCA, MS Contin,Toradol and IVF. He was transitioned to oral analgesics and his pain was controlled to allow for normal function.HE is discharged home on MS Contin and Dilaudid 4 mg q 4 hours as needed for pain.   . Elevated troponin I level: Pt had elevated Troponin levels up to as higg as 1.01. It was felt to be  secondary to increased cardiac demand and trended down as A-fib with RVR resolved. At the time of discharge his Troponin was 0.42.  Marland Kitchen Anemia of chronic disease: Pt has a baseline Hb of 6.5-7. His anemia was symptomatic with tachycardia and increased hypoxemia. He received 1 Unit RBC's to improve Oxygen carrying capacity. Att the time of discharge his Hb was 7.0. Hydrea was resumed prior to discharge. He should have his CBC with diff and reticulocytes checked prior to his visit on 9/15 for Hydrea surveillance.  . Cor pulmonale, with chronic systolic right heart failure:Pt has cor pulmonale with chronic right sided heart changes. He has an elevated BNP which is likely a reflection of PAH.  Pulmonary pressures are markedly increased and thus pre-load needs to be maintained to allow for adequate perfusion of organs. He is under the care of Dr. Felisa Bonier at [facility] and is to make an appointment to follow up ASAP.   Marland Kitchen Hypokalemia: pt was treated with oral and IV Potassium and this is resolved.   . Chronic anticoagulation: Pt has a h/o multiple PE's . He is on chronic anticoagulation with Lovenox. Continue.  . Leukocytosis: Suspect multifactorial due to bacteremia, crisis and increased bone turnover.  . Chronic pain: Continue MS Contin. Recommend Neurontin be added to treat Neuropathic pain as patient now complaining of intermittent  "pins and needles".      Disposition and Follow-up: Pt discharged home in good condition. He is to use Oxygen 3L/min while ambulating and at night. He also has dressing changes to left foot BID with Bactroban and gauze. Weight bearing as tolerated.     Discharge Instructions    Post-op shoe    Complete by:  As directed      Weight bearing as tolerated    Complete by:  As directed   Laterality:  left  Extremity:  Lower           DISCHARGE EXAM:  General: Alert, awake, oriented x3, in mild distress.  Vital Signs:BP 108/70, HR 74, T 98.9 F (37.2 C),  temperature source Oral, RR 16, height 6' (1.829 m), weight 158 lb 8.2 oz (71.9 kg), SpO2 97 %. HEENT: [place]/AT PEERL, EOMI, anicteric Neck: Trachea midline, no masses, no thyromegal,y no JVD, no carotid bruit OROPHARYNX: Moist, No exudate/ erythema/lesions.  Heart: Regular rate and rhythm, without murmurs, rubs, gallops or S3. PMI non-displaced. Exam reveals no decreased pulses. Pulmonary/Chest: Normal effort. Breath sounds normal. No. Apnea. Clear to auscultation,no stridor,  no wheezing and no rhonchi noted. No respiratory distress and no tenderness noted. Abdomen: Soft, nontender, nondistended, normal bowel sounds, no masses no hepatosplenomegaly noted. No fluid wave and no ascites. There is no guarding or rebound. Neuro: Alert and oriented to person, place and time. Normal motor skills, Displays no atrophy or tremors and exhibits normal muscle tone.  No focal neurological deficits noted cranial nerves II through XII  grossly intact. No sensory deficit noted. DTRs 2+ bilaterally upper and lower extremities. Strength at baseline in bilateral upper and lower extremities. Gait normal. Musculoskeletal: No warm swelling or erythema around joints, no spinal tenderness noted. Left great toe with good granulation and no signifiantt discharge noted.   Lymph node survey: No cervical axillary or inguinal lymphadenopathy noted. Skin: Skin is warm and dry. No bruising, no ecchymosis and no rash noted. Pt is not diaphoretic. No erythema. No pallor Psychiatric: Patient alert and oriented x3, good insight and cognition, good recent to remote recall. Mood, memory, affect and judgement normal     Recent Labs  09/01/14 0610 09/03/14 0955  NA 140 135  K 4.4 4.1  CL 111 108  CO2 22 24  GLUCOSE 104* 110*  BUN 8 7  CREATININE 0.61 0.52*  CALCIUM 8.7* 8.6*    Recent Labs  09/01/14 0610  AST 22  ALT 18  ALKPHOS 70  BILITOT 4.0*  PROT 7.0  ALBUMIN 3.5   No results for input(s): LIPASE, AMYLASE in  the last 72 hours.  Recent Labs  09/01/14 0610 09/02/14 0956  WBC 16.8* 17.5*  NEUTROABS 11.6* 11.8*  HGB 7.4* 7.0*  HCT 22.0* 20.5*  MCV 96.1 95.8  PLT 448* 410*     Total time spent including face to face and decision making was greater than 30 minutes  Signed: Janai Brannigan A. 09/03/2014, 11:30 AM

## 2014-09-03 NOTE — Care Management Note (Signed)
Case Management Note  Patient Details  Name: [name] MRN: [id number] Date of Birth: [birth year]  Subjective/Objective:                    Action/Plan:d/c home no d/c needs.   Expected Discharge Date:   (unknown)               Expected Discharge Plan:  Home/Self Care  In-House Referral:     Discharge planning Services  CM Consult  Post Acute Care Choice:    Choice offered to:     DME Arranged:    DME Agency:     HH Arranged:    [place] Agency:     Status of Service:  Completed, signed off  Medicare Important Message Given:  Yes-second notification given Date Medicare IM Given:    Medicare IM give by:    Date Additional Medicare IM Given:    Additional Medicare Important Message give by:     If discussed at [place] of Stay Meetings, dates discussed:    Additional Comments:  Dessa Phi, RN 09/03/2014, 1:26 PM

## 2014-09-03 NOTE — Progress Notes (Signed)
SATURATION QUALIFICATIONS: (This note is used to comply with regulatory documentation for home oxygen)  Patient Saturations on Room Air at Rest = 88%  Patient Saturations on Room Air while Ambulating = 84%  Patient Saturations on 3 Liters of oxygen while Ambulating = 95%  Please briefly explain why patient needs home oxygen: Pt O2 drops significantly while walking without 3L of oxygen

## 2014-09-03 NOTE — Progress Notes (Signed)
ANTIBIOTIC CONSULT NOTE - FOLLOW UP  Pharmacy Consult for vancomycin Indication: bacteremia  No Known Allergies  Patient Measurements: Height: 6' (182.9 cm) Weight: 158 lb 8.2 oz (71.9 kg) IBW/kg (Calculated) : 77.6  Vital Signs: Temp: 98.9 F (37.2 C) (08/03 0600) Temp Source: Oral (08/03 0600) BP: 108/70 mmHg (08/03 0600) Pulse Rate: 74 (08/03 0600) Intake/Output from previous day: 08/02 0701 - 08/03 0700 In: 2240 [P.O.:240; I.V.:1200; IV Piggyback:800] Out: 2575 [Urine:2575] Intake/Output from this shift:    Labs:  Recent Labs  09/01/14 0610 09/02/14 0956  WBC 16.8* 17.5*  HGB 7.4* 7.0*  PLT 448* 410*  CREATININE 0.61  --    Estimated Creatinine Clearance: 131.1 mL/min (by C-G formula based on Cr of 0.61).  Recent Labs  08/31/14 1445  [place] 18     Microbiology: Recent Results (from the past 720 hour(s))  MRSA PCR Screening     Status: None   Collection Time: 08/05/14  9:09 AM  Result Value Ref Range Status   MRSA by PCR NEGATIVE NEGATIVE Final    Comment:        The GeneXpert MRSA Assay (FDA approved for NASAL specimens only), is one component of a comprehensive MRSA colonization surveillance program. It is not intended to diagnose MRSA infection nor to guide or monitor treatment for MRSA infections.   MRSA PCR Screening     Status: None   Collection Time: 08/29/14 11:35 AM  Result Value Ref Range Status   MRSA by PCR NEGATIVE NEGATIVE Final    Comment:        The GeneXpert MRSA Assay (FDA approved for NASAL specimens only), is one component of a comprehensive MRSA colonization surveillance program. It is not intended to diagnose MRSA infection nor to guide or monitor treatment for MRSA infections.   Culture, blood (routine x 2)     Status: None (Preliminary result)   Collection Time: 08/29/14  9:30 PM  Result Value Ref Range Status   Specimen Description BLOOD RIGHT WRIST  Final   Special Requests IN PEDIATRIC BOTTLE 3ML  Final    Culture  Setup Time   Final    GRAM POSITIVE COCCI IN CLUSTERS GRAM NEGATIVE COCCOBACILLI AEROBIC BOTTLE ONLY CRITICAL RESULT CALLED TO, READ BACK BY AND VERIFIED WITH: E PELLITIER,RN AT 4098 09/01/14 BY L BENFIELD    Culture   Final    CULTURE REINCUBATED FOR BETTER GROWTH Performed at [facility]    Report Status PENDING  Incomplete  Culture, blood (routine x 2)     Status: None (Preliminary result)   Collection Time: 08/29/14  9:39 PM  Result Value Ref Range Status   Specimen Description BLOOD BLOOD RIGHT HAND  Final   Special Requests IN PEDIATRIC BOTTLE 3ML  Final   Culture   Final    NO GROWTH 3 DAYS Performed at [facility]    Report Status PENDING  Incomplete  Culture, blood (routine x 2)     Status: None (Preliminary result)   Collection Time: 09/01/14  1:40 PM  Result Value Ref Range Status   Specimen Description BLOOD RIGHT HAND  Final   Special Requests IN PEDIATRIC BOTTLE 4CC  Final   Culture   Final    NO GROWTH < 24 HOURS Performed at [facility]    Report Status PENDING  Incomplete  Culture, blood (routine x 2)     Status: None (Preliminary result)   Collection Time: 09/01/14  1:50 PM  Result Value Ref  Range Status   Specimen Description BLOOD RIGHT ARM  Final   Special Requests IN PEDIATRIC BOTTLE 4CC  Final   Culture   Final    NO GROWTH < 24 HOURS Performed at [facility]    Report Status PENDING  Incomplete    Anti-infectives    Start     Dose/Rate Route Frequency Ordered Stop   08/29/14 2359  piperacillin-tazobactam (ZOSYN) IVPB 3.375 g     3.375 g 12.5 mL/hr over 240 Minutes Intravenous Every 8 hours 08/29/14 2306     08/29/14 2359  vancomycin (VANCOCIN) IVPB 1000 mg/200 mL premix     1,000 mg 200 mL/hr over 60 Minutes Intravenous Every 8 hours 08/29/14 2317        Assessment: Patient's a 34 y.o M currently on vancomycin and zosyn #5 for bacteremia. He also has paronychial infection of left great toe with  I&D on 8/1. He remains afebrile with wbc slightly up to 17.5 today. Last vancomycin trough level dawn on 7/31 was therapeutic with current dose of 1gm IV q8h.  7/30 >>zosyn  MD>> 7/30 >>vancomycin Rx  >>    7/29 blood x2:  1/2 GPC clusters 8/1 bcx x2: ngtd  Dose changes/levels: 7/31 VT = 18 on 1gm q8hr  Goal of Therapy:  Vancomycin trough level 15-20 mcg/ml  Plan:  - continue vancomycin 1gm IV q8h - continue zosyn 3.375 gm IV q8h (infuse over 4 hours) - f/u with cultures  Colson Barco P 09/03/2014,7:42 AM

## 2014-09-03 NOTE — Care Management Note (Signed)
Case Management Note  Patient Details  Name: SAMUELL KNOBLE MRN: [id number] Date of Birth: 06/05/1980  Subjective/Objective:    Home 02 ordered continuous. [facility] dme rep Pura Spice aware of order, will bring travel tank to patient's rm prior d/c.                Action/Plan:d/c plan home w/home 02 continuous.   Expected Discharge Date:   (unknown)               Expected Discharge Plan:  Home/Self Care  In-House Referral:     Discharge planning Services  CM Consult  Post Acute Care Choice:    Choice offered to:     DME Arranged:  [facility] DME Agency:  [place]:    [place] Agency:     Status of Service:  Completed, signed off  Medicare Important Message Given:  Yes-second notification given Date Medicare IM Given:    Medicare IM give by:    Date Additional Medicare IM Given:    Additional Medicare Important Message give by:     If discussed at [place] of Stay Meetings, dates discussed:    Additional Comments:  Dessa Phi, RN 09/03/2014, 2:09 PM

## 2014-09-03 NOTE — Progress Notes (Signed)
Completed  D/C teaching with patient. Answered all questions. Pt will be D.C home with family in stable condition. 

## 2014-09-03 NOTE — Care Management Note (Signed)
Case Management Note  Patient Details  Name: JARAY BOLIVER MRN: [id number] Date of Birth: February 03, 1980  Subjective/Objective:  Noted 02 sats. Patient uses home 02 HS currently. He does not have a travel tank. If home 02 needed more often, can arrange if ordered. [facility] dme rep Pura Spice already following if ordered.Nsg notified.                  Action/Plan:d/c plan home.   Expected Discharge Date:   (unknown)               Expected Discharge Plan:  Home/Self Care  In-House Referral:     Discharge planning Services  CM Consult  Post Acute Care Choice:    Choice offered to:     DME Arranged:    DME Agency:     HH Arranged:    [place] Agency:     Status of Service:  Completed, signed off  Medicare Important Message Given:  Yes-second notification given Date Medicare IM Given:    Medicare IM give by:    Date Additional Medicare IM Given:    Additional Medicare Important Message give by:     If discussed at [place] of Stay Meetings, dates discussed:    Additional Comments:  Dessa Phi, RN 09/03/2014, 1:55 PM

## 2014-09-03 NOTE — Discharge Instructions (Signed)
Pt to use Oxygen 3L/min while walking and at night.  Dressing Change A dressing is a material placed over wounds. It keeps the wound clean, dry, and protected from further injury. This provides an environment that favors wound healing.  BEFORE YOU BEGIN  Get your supplies together. Things you may need include:  Flexible gauze dressing.  Medicated cream.  Tape.  Gloves.  Gauze squares.  Plastic bags.  Take pain medicine 30 minutes before the dressing change if you need it.  Take a shower before you do the first dressing change of the day. Use plastic wrap or a plastic bag to prevent the dressing from getting wet. REMOVING YOUR OLD DRESSING   Wash your hands with soap and water. Dry your hands with a clean towel.  Put on your gloves.  Remove any tape.  Carefully remove the old dressing. If the dressing sticks, you may dampen it with warm water to loosen it, or follow your caregiver's specific directions.  Remove any gauze or packing tape that is in your wound.  Take off your gloves.  Put the gloves, tape, gauze, or any packing tape into a plastic bag. CHANGING YOUR DRESSING  Open the supplies.  Take the cap off the saline solution.  Open the gauze package so that the gauze remains on the inside of the package.  Put on your gloves.  Clean your wound as told by your caregiver.  If you have been told to keep your wound dry, follow those instructions.  Your caregiver may tell you to do one or more of the following:  Pick up the gauze. Pour the saline solution over the gauze. Squeeze out the extra saline solution.  Put medicated cream or other medicine on your wound if you have been told to do so.  Put the solution soaked gauze only in your wound, not on the skin around it.  Pack your wound loosely or as told by your caregiver.  Put dry gauze on your wound.  Put abdominal dressing pads over the dry gauze if your wet gauze soaks through.  Tape the abdominal  dressing pads in place so they will not fall off. Do not wrap the tape completely around the affected part (arm, leg, abdomen).  Wrap the dressing pads with a flexible gauze dressing to secure it in place.  Take off your gloves. Put them in the plastic bag with the old dressing. Tie the bag shut and throw it away.  Keep the dressing clean and dry until your next dressing change.  Wash your hands. SEEK MEDICAL CARE IF:  Your skin around the wound looks red.  Your wound feels more tender or sore.  You see pus in the wound.  Your wound smells bad.  You have a fever.  Your skin around the wound has a rash that itches and burns.  You see black or yellow skin in your wound that was not there before.  You feel nauseous, throw up, and feel very tired. Document Released: 02/25/2004 Document Revised: 04/11/2011 Document Reviewed: 11/29/2010 [facility] Patient Information 2015 [place], [place]. This information is not intended to replace advice given to you by your health care provider. Make sure you discuss any questions you have with your health care provider.

## 2014-09-03 NOTE — Progress Notes (Signed)
Waste 58mL of High concentration dilaudid from PCA pump. Shirlee Limerick, RN witnessed waste.  Cathie Hoops, RN

## 2014-09-04 DIAGNOSIS — D72829 Elevated white blood cell count, unspecified: Secondary | ICD-10-CM | POA: Insufficient documentation

## 2014-09-04 DIAGNOSIS — R7881 Bacteremia: Secondary | ICD-10-CM | POA: Insufficient documentation

## 2014-09-04 DIAGNOSIS — L03032 Cellulitis of left toe: Secondary | ICD-10-CM | POA: Insufficient documentation

## 2014-09-04 LAB — CULTURE, BLOOD (ROUTINE X 2): Culture: NO GROWTH

## 2014-09-05 LAB — CULTURE, BLOOD (ROUTINE X 2)

## 2014-09-06 LAB — CULTURE, BLOOD (ROUTINE X 2)
CULTURE: NO GROWTH
Culture: NO GROWTH

## 2014-09-08 ENCOUNTER — Telehealth (HOSPITAL_COMMUNITY): Payer: Self-pay

## 2014-09-08 ENCOUNTER — Encounter (HOSPITAL_COMMUNITY): Payer: Self-pay

## 2014-09-08 ENCOUNTER — Non-Acute Institutional Stay (HOSPITAL_BASED_OUTPATIENT_CLINIC_OR_DEPARTMENT_OTHER)
Admission: AD | Admit: 2014-09-08 | Discharge: 2014-09-08 | Disposition: A | Discharge status: Home / Self Care | Payer: Medicare Other | Source: Home / Self Care | Attending: Internal Medicine | Admitting: Internal Medicine

## 2014-09-08 DIAGNOSIS — R0989 Other specified symptoms and signs involving the circulatory and respiratory systems: Secondary | ICD-10-CM | POA: Diagnosis not present

## 2014-09-08 DIAGNOSIS — R0902 Hypoxemia: Secondary | ICD-10-CM | POA: Diagnosis not present

## 2014-09-08 DIAGNOSIS — F1210 Cannabis abuse, uncomplicated: Secondary | ICD-10-CM

## 2014-09-08 DIAGNOSIS — F39 Unspecified mood [affective] disorder: Secondary | ICD-10-CM | POA: Insufficient documentation

## 2014-09-08 DIAGNOSIS — I5022 Chronic systolic (congestive) heart failure: Secondary | ICD-10-CM | POA: Diagnosis present

## 2014-09-08 DIAGNOSIS — Z9981 Dependence on supplemental oxygen: Secondary | ICD-10-CM | POA: Diagnosis not present

## 2014-09-08 DIAGNOSIS — G894 Chronic pain syndrome: Secondary | ICD-10-CM | POA: Diagnosis not present

## 2014-09-08 DIAGNOSIS — Z9114 Patient's other noncompliance with medication regimen: Secondary | ICD-10-CM | POA: Insufficient documentation

## 2014-09-08 DIAGNOSIS — Z87891 Personal history of nicotine dependence: Secondary | ICD-10-CM

## 2014-09-08 DIAGNOSIS — Z86718 Personal history of other venous thrombosis and embolism: Secondary | ICD-10-CM

## 2014-09-08 DIAGNOSIS — I2789 Other specified pulmonary heart diseases: Secondary | ICD-10-CM | POA: Diagnosis not present

## 2014-09-08 DIAGNOSIS — Z79899 Other long term (current) drug therapy: Secondary | ICD-10-CM

## 2014-09-08 DIAGNOSIS — I509 Heart failure, unspecified: Secondary | ICD-10-CM | POA: Diagnosis not present

## 2014-09-08 DIAGNOSIS — D638 Anemia in other chronic diseases classified elsewhere: Secondary | ICD-10-CM | POA: Diagnosis not present

## 2014-09-08 DIAGNOSIS — J9601 Acute respiratory failure with hypoxia: Secondary | ICD-10-CM

## 2014-09-08 DIAGNOSIS — D5700 Hb-SS disease with crisis, unspecified: Secondary | ICD-10-CM | POA: Diagnosis not present

## 2014-09-08 DIAGNOSIS — E876 Hypokalemia: Secondary | ICD-10-CM | POA: Diagnosis not present

## 2014-09-08 DIAGNOSIS — Z7982 Long term (current) use of aspirin: Secondary | ICD-10-CM | POA: Insufficient documentation

## 2014-09-08 DIAGNOSIS — I2781 Cor pulmonale (chronic): Secondary | ICD-10-CM | POA: Diagnosis not present

## 2014-09-08 DIAGNOSIS — I10 Essential (primary) hypertension: Secondary | ICD-10-CM

## 2014-09-08 DIAGNOSIS — I270 Primary pulmonary hypertension: Secondary | ICD-10-CM | POA: Diagnosis not present

## 2014-09-08 DIAGNOSIS — D5701 Hb-SS disease with acute chest syndrome: Secondary | ICD-10-CM | POA: Diagnosis not present

## 2014-09-08 DIAGNOSIS — J9690 Respiratory failure, unspecified, unspecified whether with hypoxia or hypercapnia: Secondary | ICD-10-CM | POA: Diagnosis not present

## 2014-09-08 DIAGNOSIS — R7989 Other specified abnormal findings of blood chemistry: Secondary | ICD-10-CM | POA: Diagnosis not present

## 2014-09-08 DIAGNOSIS — Z7901 Long term (current) use of anticoagulants: Secondary | ICD-10-CM

## 2014-09-08 DIAGNOSIS — I2699 Other pulmonary embolism without acute cor pulmonale: Secondary | ICD-10-CM | POA: Diagnosis not present

## 2014-09-08 DIAGNOSIS — Z86711 Personal history of pulmonary embolism: Secondary | ICD-10-CM

## 2014-09-08 DIAGNOSIS — D7389 Other diseases of spleen: Secondary | ICD-10-CM

## 2014-09-08 DIAGNOSIS — Z96641 Presence of right artificial hip joint: Secondary | ICD-10-CM | POA: Diagnosis present

## 2014-09-08 DIAGNOSIS — Z9119 Patient's noncompliance with other medical treatment and regimen: Secondary | ICD-10-CM | POA: Diagnosis present

## 2014-09-08 DIAGNOSIS — J9621 Acute and chronic respiratory failure with hypoxia: Secondary | ICD-10-CM | POA: Diagnosis not present

## 2014-09-08 DIAGNOSIS — R9431 Abnormal electrocardiogram [ECG] [EKG]: Secondary | ICD-10-CM | POA: Diagnosis not present

## 2014-09-08 MED ORDER — NALOXONE HCL 0.4 MG/ML IJ SOLN: 0.4000 mg | INTRAMUSCULAR | Status: DC | PRN

## 2014-09-08 MED ORDER — DIPHENHYDRAMINE HCL 12.5 MG/5ML PO ELIX: 12.5000 mg | ORAL_SOLUTION | Freq: Four times a day (QID) | ORAL | Status: DC | PRN

## 2014-09-08 MED ORDER — FOLIC ACID 1 MG PO TABS: 1.0000 mg | ORAL_TABLET | Freq: Every day | ORAL | Status: DC

## 2014-09-08 MED ORDER — DEXTROSE-NACL 5-0.45 % IV SOLN
INTRAVENOUS | Status: DC
  Administered 2014-09-08: 10:00:00 via INTRAVENOUS

## 2014-09-08 MED ORDER — SENNOSIDES-DOCUSATE SODIUM 8.6-50 MG PO TABS: 1.0000 | ORAL_TABLET | Freq: Two times a day (BID) | ORAL | Status: DC

## 2014-09-08 MED ORDER — HEPARIN SOD (PORK) LOCK FLUSH 100 UNIT/ML IV SOLN
500.0000 [IU] | INTRAVENOUS | Status: AC | PRN
  Administered 2014-09-08: 500 [IU]
  Filled 2014-09-08: qty 5

## 2014-09-08 MED ORDER — DIPHENHYDRAMINE HCL 50 MG/ML IJ SOLN
12.5000 mg | Freq: Four times a day (QID) | INTRAMUSCULAR | Status: DC | PRN
  Filled 2014-09-08: qty 0.25

## 2014-09-08 MED ORDER — HYDROMORPHONE 2 MG/ML HIGH CONCENTRATION IV PCA SOLN
INTRAVENOUS | Status: DC
  Administered 2014-09-08: 10:00:00 via INTRAVENOUS
  Administered 2014-09-08: 12.75 mg via INTRAVENOUS
  Administered 2014-09-08: 10.5 mg via INTRAVENOUS
  Filled 2014-09-08 (×2): qty 25

## 2014-09-08 MED ORDER — SODIUM CHLORIDE 0.9 % IJ SOLN: 9.0000 mL | INTRAMUSCULAR | Status: DC | PRN

## 2014-09-08 MED ORDER — SODIUM CHLORIDE 0.9 % IJ SOLN
10.0000 mL | INTRAMUSCULAR | Status: AC | PRN
  Administered 2014-09-08: 10 mL

## 2014-09-08 MED ORDER — ONDANSETRON HCL 4 MG/2ML IJ SOLN: 4.0000 mg | Freq: Four times a day (QID) | INTRAMUSCULAR | Status: DC | PRN

## 2014-09-08 MED ORDER — POLYETHYLENE GLYCOL 3350 17 G PO PACK: 17.0000 g | PACK | Freq: Every day | ORAL | Status: DC | PRN

## 2014-09-08 NOTE — Telephone Encounter (Signed)
Patient called complaining of pain all over, Rates pain 8/10. Last pain medication dilaudid 2mg  taken at 0500. Denies fever, chest pain, SOB, nausea, vomiting, or diarrhea.. Would like to come to the hospital treatment

## 2014-09-08 NOTE — Telephone Encounter (Signed)
After speaking with MD, returned patient phone call. Patient can report to the day hospital. No answer, left message.

## 2014-09-08 NOTE — Telephone Encounter (Signed)
Patient returned my previous phone call. I told him MD stated he could come to day hospital. Patient states he will be he in about 20 minutes

## 2014-09-08 NOTE — H&P (Signed)
@LOGO @  [name], is a 34 y.o. male  JJO:[id number]  ZSW:[id number]  DOB - [birth year]  CC:  Chief Complaint  Patient presents with  . Sickle Cell Pain Crisis    all over 8/10       HPI: [name] is a 34 y.o. male here for sickle cell crisis.His symptoms started on Friday 3 days ago. He is experiencing his usual symptoms of pain in his upper body Both front and back as well as his legs. He reports his pain as being 8/10.  He had his last medications, both MS Contin and Dilaudid at 7 am. He reports he is taking his hydrea and folic acid. He has had no ambien in two days.  No Known Allergies Past Medical History  Diagnosis Date  . Sickle cell anemia   . Blood transfusion   . Acute embolism and thrombosis of right internal jugular vein   . Hypokalemia   . Mood disorder   . History of pulmonary embolus (PE)   . Avascular necrosis   . Leukocytosis     Chronic  . Thrombocytosis     Chronic  . Hypertension   . History of Clostridium difficile infection   . Uses marijuana   . Chronic anticoagulation   . Functional asplenia   . Former smoker   . Second hand tobacco smoke exposure   . Alcohol consumption of one to four drinks per day   . Noncompliance with medication regimen   . Sickle-cell crisis with associated acute chest syndrome 05/13/2013  . Acute chest syndrome 06/18/2013  . Demand ischemia 01/02/2014   No current facility-administered medications on file prior to encounter.   Current Outpatient Prescriptions on File Prior to Encounter  Medication Sig Dispense Refill  . Cholecalciferol (VITAMIN D) 2000 UNITS tablet Take 1 tablet (2,000 Units total) by mouth daily. 90 tablet 3  . diltiazem (CARDIZEM CD) 120 MG 24 hr capsule Take 1 capsule (120 mg total) by mouth daily. 30 capsule 1  . enoxaparin (LOVENOX) 150 MG/ML injection Inject 0.73 mLs (110 mg total) into the skin daily. Starting tonight 07/23/2014 at 10:00pm (Patient taking differently: Inject 110 mg  into the skin daily. ) 22 Syringe 5  . folic acid (FOLVITE) 1 MG tablet Take 1 tablet (1 mg total) by mouth every morning. 30 tablet 11  . HYDROmorphone (DILAUDID) 4 MG tablet Take 1 tablet (4 mg total) by mouth every 4 (four) hours as needed for severe pain. 90 tablet 0  . hydroxyurea (HYDREA) 500 MG capsule Take 3 capsules (1,500 mg total) by mouth daily. May take with food to minimize GI side effects. 60 capsule 3  . lisinopril (PRINIVIL,ZESTRIL) 10 MG tablet Take 1 tablet (10 mg total) by mouth daily. 30 tablet 1  . metoprolol tartrate (LOPRESSOR) 25 MG tablet Take 1 tablet (25 mg total) by mouth daily. 30 tablet 6  . morphine (MS CONTIN) 30 MG 12 hr tablet Take 1 tablet (30 mg total) by mouth every 12 (twelve) hours. 60 tablet 0  . potassium chloride SA (K-DUR,KLOR-CON) 20 MEQ tablet Take 1 tablet (20 mEq total) by mouth every morning. 30 tablet 3  . zolpidem (AMBIEN) 10 MG tablet Take 1 tablet (10 mg total) by mouth at bedtime as needed for sleep. 30 tablet 0  . amoxicillin-clavulanate (AUGMENTIN) 875-125 MG per tablet Take 1 tablet by mouth 2 (two) times daily. 10 tablet 0  . aspirin 81 MG chewable tablet Chew 1 tablet (81 mg total) by  mouth daily. (Patient not taking: Reported on 08/29/2014) 30 tablet 11  . mupirocin ointment (BACTROBAN) 2 % Apply 1 application topically 2 (two) times daily. Apply ointment to left great toe twice a day and apply dry dressing 22 g 6   Family History  Problem Relation Age of Onset  . Sickle cell trait Mother   . Depression Mother   . Diabetes Mother   . Sickle cell trait Father   . Sickle cell trait Brother    History   Social History  . Marital Status: Single    Spouse Name: N/A  . Number of Children: 0  . Years of Education: 13   Occupational History  . 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 Smoking status: Former Smoker -- 13 years    Quit date: 07/08/2010  . Smokeless tobacco: Never  Used  . Alcohol Use: No  . Drug Use: 2.00 per week    Special: Marijuana  . Sexual Activity:    Partners: Female    Museum/gallery curator: None     Comment: month ago   Other Topics Concern  . Not on file   Social History Narrative   Lives in an apartment.  Single.  Lives alone but has a girlfriend that helps care for him.  Does not use any assist devices.        [name]:  [phone] Mom, emergency contact    Review of Systems: Constitutional: Denies chills, fever, weight loss HENT: Denies Problems Eyes: Denies problems Neck: Denies problems Respiratory: Negative for cough, shortness of breath,   Cardiovascular: Negative for chest pain, palpitations and leg swelling. Gastrointestinal: Negative for abdominal distention, abdominal pain, nausea, vomiting, diarrhea,  Genitourinary: Denies problems Musculoskeletal: Positive for pain in upper trunk and legs. Neurological: Denies problems Hematological: Denies problems Psychiatric/Behavioral: Denies depression, anxiety.   Objective:   Filed Vitals:   09/08/14 0944  BP: 100/69  Pulse: 102  Temp: 97.2 F (36.2 C)  Resp: 20    HPI: [name] is a 34 y.o. male here for sickle cell crisis.His symptoms started on Friday 3 days ago. He is experiencing his usual symptoms of pain in his upper body Both front and back as well as his legs. He reports his pain as being 8/10.  He had his last medications, both MS Contin and Dilaudid at 7 am. He reports he is taking his hydrea and folic acid. He has had no ambien in    Hospital course. He was admitted to day hospital and treated with IV Dilaudid by PCA. He tolerated treatment well and was discharged with a pain level reported at 4-5/10. He is to continue home medications and to follow-up in clinic in 1-3 days.  Physical Exam: Constitutional: Patient appears well-developed and well-nourished. No distress. HENT: Normocephalic, atraumatic, External right and left ear  normal. Oropharynx is clear and moist.  Neck: Normal ROM. Neck supple. No lymphadenopathy, No thyromegaly. CVS: RRR, S1/S2 +, no murmurs, no gallops, no rubs Pulmonary: Effort and breath sounds normal, no stridor, rhonchi, wheezes, rales.  Abdominal: Soft. Normoactive BS,, no distension, tenderness, rebound or guarding.  Neuro: Alert. Oriented, seems sedated Skin: Skin is warm and dry. No rash noted. Not diaphoretic. No erythema. No pallor. Psychiatric: Normal mood and affect. Behavior, judgment, thought content normal.  Lab Results  Component Value Date   WBC 17.5* 09/02/2014   HGB 7.0* 09/02/2014  HCT 20.5* 09/02/2014   MCV 95.8 09/02/2014   PLT 410* 09/02/2014   Lab Results  Component Value Date   CREATININE 0.52* 09/03/2014   BUN 7 09/03/2014   NA 135 09/03/2014   K 4.1 09/03/2014   CL 108 09/03/2014   CO2 24 09/03/2014    No results found for: HGBA1C Lipid Panel     Component Value Date/Time   CHOL 63 06/04/2013 0524   TRIG 63 06/04/2013 0524   HDL 24* 06/04/2013 0524   CHOLHDL 2.6 06/04/2013 0524   VLDL 13 06/04/2013 0524   LDLCALC 26 06/04/2013 0524       Assessment and plan:    Sickle Cell Crisis -Treated in day hospital with IV dilaudid by PCA -He was discharged home to continue home medications -He is to return to clinic in 1-3 days        Micheline Chapman, MSN, FNP-BC   09/08/2014, 10:00 AM

## 2014-09-08 NOTE — Progress Notes (Signed)
Patient treated at day hospital today. At discharge, pain rate 5/10. Patient was alert oriented and ambulatory. Instructions reviewed and given to patient.

## 2014-09-09 ENCOUNTER — Non-Acute Institutional Stay (HOSPITAL_COMMUNITY)
Admission: AD | Admit: 2014-09-09 | Discharge: 2014-09-09 | Disposition: A | Discharge status: Home / Self Care | Payer: Medicare Other | Source: Ambulatory Visit | Attending: Internal Medicine | Admitting: Internal Medicine

## 2014-09-09 ENCOUNTER — Encounter: Payer: Self-pay | Admitting: Internal Medicine

## 2014-09-09 ENCOUNTER — Ambulatory Visit (INDEPENDENT_AMBULATORY_CARE_PROVIDER_SITE_OTHER): Payer: Medicare Other | Admitting: Internal Medicine

## 2014-09-09 ENCOUNTER — Other Ambulatory Visit: Payer: Self-pay | Admitting: Family Medicine

## 2014-09-09 ENCOUNTER — Encounter (HOSPITAL_COMMUNITY): Payer: Self-pay

## 2014-09-09 VITALS — BP 142/82 | HR 113 | Temp 98.3°F | Resp 22 | Ht 72.0 in | Wt 149.0 lb

## 2014-09-09 DIAGNOSIS — Z9981 Dependence on supplemental oxygen: Secondary | ICD-10-CM

## 2014-09-09 DIAGNOSIS — Z87891 Personal history of nicotine dependence: Secondary | ICD-10-CM

## 2014-09-09 DIAGNOSIS — Z96641 Presence of right artificial hip joint: Secondary | ICD-10-CM

## 2014-09-09 DIAGNOSIS — Z7982 Long term (current) use of aspirin: Secondary | ICD-10-CM | POA: Insufficient documentation

## 2014-09-09 DIAGNOSIS — D5700 Hb-SS disease with crisis, unspecified: Secondary | ICD-10-CM | POA: Diagnosis present

## 2014-09-09 DIAGNOSIS — G894 Chronic pain syndrome: Secondary | ICD-10-CM | POA: Diagnosis not present

## 2014-09-09 DIAGNOSIS — Z79899 Other long term (current) drug therapy: Secondary | ICD-10-CM | POA: Insufficient documentation

## 2014-09-09 DIAGNOSIS — I10 Essential (primary) hypertension: Secondary | ICD-10-CM

## 2014-09-09 DIAGNOSIS — I482 Chronic atrial fibrillation, unspecified: Secondary | ICD-10-CM

## 2014-09-09 DIAGNOSIS — Z832 Family history of diseases of the blood and blood-forming organs and certain disorders involving the immune mechanism: Secondary | ICD-10-CM | POA: Insufficient documentation

## 2014-09-09 DIAGNOSIS — D571 Sickle-cell disease without crisis: Secondary | ICD-10-CM

## 2014-09-09 DIAGNOSIS — Z86711 Personal history of pulmonary embolism: Secondary | ICD-10-CM

## 2014-09-09 LAB — LACTATE DEHYDROGENASE: LDH: 525 U/L — AB (ref 98–192)

## 2014-09-09 LAB — CBC WITH DIFFERENTIAL/PLATELET
Basophils Absolute: 0 10*3/uL (ref 0.0–0.1)
Basophils Relative: 0 % (ref 0–1)
EOS PCT: 1 % (ref 0–5)
Eosinophils Absolute: 0.2 10*3/uL (ref 0.0–0.7)
HCT: 23.6 % — ABNORMAL LOW (ref 39.0–52.0)
Hemoglobin: 7.9 g/dL — ABNORMAL LOW (ref 13.0–17.0)
LYMPHS ABS: 1.8 10*3/uL (ref 0.7–4.0)
Lymphocytes Relative: 8 % — ABNORMAL LOW (ref 12–46)
MCH: 31.2 pg (ref 26.0–34.0)
MCHC: 33.5 g/dL (ref 30.0–36.0)
MCV: 93.3 fL (ref 78.0–100.0)
MONO ABS: 2.5 10*3/uL — AB (ref 0.1–1.0)
Monocytes Relative: 11 % (ref 3–12)
NEUTROS ABS: 18.3 10*3/uL — AB (ref 1.7–7.7)
Neutrophils Relative %: 80 % — ABNORMAL HIGH (ref 43–77)
PLATELETS: 753 10*3/uL — AB (ref 150–400)
RBC: 2.53 MIL/uL — ABNORMAL LOW (ref 4.22–5.81)
RDW: 22.4 % — ABNORMAL HIGH (ref 11.5–15.5)
WBC: 22.8 10*3/uL — ABNORMAL HIGH (ref 4.0–10.5)

## 2014-09-09 LAB — RETICULOCYTES
RBC.: 2.53 MIL/uL — AB (ref 4.22–5.81)
Retic Count, Absolute: 452.9 10*3/uL — ABNORMAL HIGH (ref 19.0–186.0)
Retic Ct Pct: 17.9 % — ABNORMAL HIGH (ref 0.4–3.1)

## 2014-09-09 MED ORDER — MORPHINE SULFATE ER 30 MG PO TBCR: 30.0000 mg | EXTENDED_RELEASE_TABLET | Freq: Two times a day (BID) | ORAL | Status: DC

## 2014-09-09 MED ORDER — SENNOSIDES-DOCUSATE SODIUM 8.6-50 MG PO TABS
1.0000 | ORAL_TABLET | Freq: Two times a day (BID) | ORAL | Status: DC
  Administered 2014-09-09: 1 via ORAL
  Filled 2014-09-09: qty 1

## 2014-09-09 MED ORDER — SODIUM CHLORIDE 0.9 % IJ SOLN
10.0000 mL | INTRAMUSCULAR | Status: AC | PRN
  Administered 2014-09-09: 10 mL

## 2014-09-09 MED ORDER — DEXTROSE-NACL 5-0.45 % IV SOLN
INTRAVENOUS | Status: DC
  Administered 2014-09-09: 12:00:00 via INTRAVENOUS

## 2014-09-09 MED ORDER — HEPARIN SOD (PORK) LOCK FLUSH 100 UNIT/ML IV SOLN
500.0000 [IU] | INTRAVENOUS | Status: AC | PRN
  Administered 2014-09-09: 500 [IU]
  Filled 2014-09-09: qty 5

## 2014-09-09 MED ORDER — SODIUM CHLORIDE 0.9 % IJ SOLN: 9.0000 mL | INTRAMUSCULAR | Status: DC | PRN

## 2014-09-09 MED ORDER — HYDROMORPHONE HCL 4 MG PO TABS: 4.0000 mg | ORAL_TABLET | ORAL | Status: DC | PRN

## 2014-09-09 MED ORDER — NALOXONE HCL 0.4 MG/ML IJ SOLN: 0.4000 mg | INTRAMUSCULAR | Status: DC | PRN

## 2014-09-09 MED ORDER — POLYETHYLENE GLYCOL 3350 17 G PO PACK: 17.0000 g | PACK | Freq: Every day | ORAL | Status: DC | PRN

## 2014-09-09 MED ORDER — HYDROXYUREA 500 MG PO CAPS: 1500.0000 mg | ORAL_CAPSULE | Freq: Every day | ORAL | Status: DC

## 2014-09-09 MED ORDER — ONDANSETRON HCL 4 MG/2ML IJ SOLN: 4.0000 mg | Freq: Four times a day (QID) | INTRAMUSCULAR | Status: DC | PRN

## 2014-09-09 MED ORDER — HYDROMORPHONE 2 MG/ML HIGH CONCENTRATION IV PCA SOLN
INTRAVENOUS | Status: DC
  Administered 2014-09-09: 12:00:00 via INTRAVENOUS
  Administered 2014-09-09: 4 mg via INTRAVENOUS
  Administered 2014-09-09: 18.3 mg via INTRAVENOUS
  Filled 2014-09-09: qty 25

## 2014-09-09 MED ORDER — SODIUM CHLORIDE 0.9 % IV SOLN: 25.0000 mg | Freq: Four times a day (QID) | INTRAVENOUS | Status: DC | PRN

## 2014-09-09 MED ORDER — DIPHENHYDRAMINE HCL 12.5 MG/5ML PO ELIX: 25.0000 mg | ORAL_SOLUTION | Freq: Four times a day (QID) | ORAL | Status: DC | PRN

## 2014-09-09 MED ORDER — FOLIC ACID 1 MG PO TABS
1.0000 mg | ORAL_TABLET | Freq: Every day | ORAL | Status: DC
  Administered 2014-09-09: 1 mg via ORAL
  Filled 2014-09-09: qty 1

## 2014-09-09 MED ORDER — KETOROLAC TROMETHAMINE 30 MG/ML IJ SOLN
30.0000 mg | Freq: Four times a day (QID) | INTRAMUSCULAR | Status: DC
  Administered 2014-09-09: 30 mg via INTRAVENOUS
  Filled 2014-09-09: qty 1

## 2014-09-09 NOTE — Discharge Summary (Signed)
Physician Discharge Summary  [name] HUD:[id number] DOB: [birth year] DOA: 09/09/2014  PCP: [name], MD  Admit date: 09/09/2014 Discharge date: 09/09/2014  Time spent: 30 minutes   Discharge Diagnoses:  Active Problems:   Sickle cell anemia with pain   Discharge Condition: Stable Diet recommendation: Regular  Filed Weights   09/09/14 1103  Weight: 150 lb (68.04 kg)    History of present illness:  [name] is a 34 y.o. male patient was seen and the day hospital today for routine follow-up, he complained of ongoing pain that has been going on for more than 2 weeks for which patient has been admitted to the hospital and discharged and also treated here yesterday with PCA Dilaudid and IV Toradol. He claims pain is still 10 out of 10 and he would like to be admitted again for day treatment. He has no fever, denies chest pain, he is short of breath but at baseline, he uses oxygen at home at about 3 L while ambulating and while sleeping, although patient said he needs the oxygen tank to be activated by advised home care. He denies any abdominal pain, no nausea or vomiting, no diarrhea.   Hospital Course:  Patient was admitted to the day hospital with sickle cell painful crisis. He was treated with IV Dilaudid PCA, Toradol as well as IV fluids. He improved symptomatically and his pain went down from 8 out of 10 down to 4 out of 10 at the time of discharge. He is to follow-up in the clinic as previously scheduled. And he is to continue with his home medications as per prior to admission.  Discharge Exam: Filed Vitals:   09/09/14 1650  BP:   Pulse:   Temp:   Resp: 23    General appearance: alert, cooperative and no distress Eyes: conjunctivae/corneas clear. PERRL, EOM's intact. Fundi benign. Neck: no adenopathy, no carotid bruit, no JVD, supple, symmetrical, trachea midline and thyroid not enlarged, symmetric, no tenderness/mass/nodules Back: symmetric, no  curvature. ROM normal. No CVA tenderness. Resp: clear to auscultation bilaterally Chest wall: no tenderness Cardio: regular rate and rhythm, S1, S2 normal, no murmur, click, rub or gallop GI: soft, non-tender; bowel sounds normal; no masses, no organomegaly Extremities: extremities normal, atraumatic, no cyanosis or edema Pulses: 2+ and symmetric Skin: Skin color, texture, turgor normal. No rashes or lesions Neurologic: Grossly normal  Discharge Instructions    Discharge Medication List as of 09/09/2014  4:47 PM    CONTINUE these medications which have NOT CHANGED   Details  aspirin 81 MG chewable tablet Chew 1 tablet (81 mg total) by mouth daily., Starting 07/23/2014, Until Discontinued, Normal    Cholecalciferol (VITAMIN D) 2000 UNITS tablet Take 1 tablet (2,000 Units total) by mouth daily., Starting 02/13/2014, Until Discontinued, Normal    diltiazem (CARDIZEM CD) 120 MG 24 hr capsule Take 1 capsule (120 mg total) by mouth daily., Starting 03/13/2014, Until Discontinued, Print    enoxaparin (LOVENOX) 150 MG/ML injection Inject 0.73 mLs (110 mg total) into the skin daily. Starting tonight 07/23/2014 at 10:00pm, Starting 08/12/2014, Until Discontinued, Normal    folic acid (FOLVITE) 1 MG tablet Take 1 tablet (1 mg total) by mouth every morning., Starting 10/08/2013, Until Discontinued, Normal    HYDROmorphone (DILAUDID) 4 MG tablet Take 1 tablet (4 mg total) by mouth every 4 (four) hours as needed for severe pain., Starting 09/09/2014, Until Discontinued, Print    hydroxyurea (HYDREA) 500 MG capsule Take 3 capsules (1,500 mg total) by  mouth daily. May take with food to minimize GI side effects., Starting 09/09/2014, Until Discontinued, Normal    lisinopril (PRINIVIL,ZESTRIL) 10 MG tablet Take 1 tablet (10 mg total) by mouth daily., Starting 01/22/2014, Until Discontinued, Normal    metoprolol tartrate (LOPRESSOR) 25 MG tablet Take 1 tablet (25 mg total) by mouth daily., Starting 08/22/2013,  Until Discontinued, Normal    morphine (MS CONTIN) 30 MG 12 hr tablet Take 1 tablet (30 mg total) by mouth every 12 (twelve) hours., Starting 09/09/2014, Until Discontinued, Print    potassium chloride SA (K-DUR,KLOR-CON) 20 MEQ tablet Take 1 tablet (20 mEq total) by mouth every morning., Starting 06/09/2014, Until Discontinued, Normal    zolpidem (AMBIEN) 10 MG tablet Take 1 tablet (10 mg total) by mouth at bedtime as needed for sleep., Starting 08/08/2014, Until Discontinued, Print    amoxicillin-clavulanate (AUGMENTIN) 875-125 MG per tablet Take 1 tablet by mouth 2 (two) times daily., Starting 09/03/2014, Until Discontinued, Normal    mupirocin ointment (BACTROBAN) 2 % Apply 1 application topically 2 (two) times daily. Apply ointment to left great toe twice a day and apply dry dressing, Starting 09/01/2014, Until Discontinued, Normal       No Known Allergies    The results of significant diagnostics from this hospitalization (including imaging, microbiology, ancillary and laboratory) are listed below for reference.    Significant Diagnostic Studies: Dg Chest 2 View  08/29/2014   CLINICAL DATA:  Sickle cell crisis. Chest pain. Severe bilateral lower extremity pain.  EXAM: CHEST  2 VIEW  COMPARISON:  08/24/2014.  FINDINGS: No change. Artifact overlies the chest. Cardiac silhouette remains enlarged. Mediastinal shadows are otherwise normal. Power port inserted from the left subclavian vein has its tip in the SVC just above the right atrium. Vascular plethora remains present without edema or effusions. Pulmonary scarring remains present.  IMPRESSION: No acute finding. Cardiomegaly. Pulmonary vascular plethora. Pulmonary scarring.   Electronically Signed   By: Nelson Chimes M.D.   On: 08/29/2014 08:49   Dg Chest 2 View  08/24/2014   CLINICAL DATA:  Sickle cell crisis, shortness of breath  EXAM: CHEST  2 VIEW  COMPARISON:  08/05/2014  FINDINGS: Moderate enlargement of the cardiac silhouette is  reidentified. Diffusely prominent interstitial markings are stable with areas of linear probable superimposed scarring. No pleural effusion. Cholecystectomy clips are noted. No acute osseous finding. Left-sided Port-A-Cath in place with tip at the cavoatrial junction.  IMPRESSION: No focal acute finding.   Electronically Signed   By: Conchita Paris M.D.   On: 08/24/2014 08:41   Dg Ankle Complete Left  08/24/2014   CLINICAL DATA:  Sickle cell crisis with left lower leg and ankle pain and swelling.  EXAM: LEFT ANKLE COMPLETE - 3+ VIEW  COMPARISON:  12/21/2006  FINDINGS: Diffuse soft tissue swelling is seen surrounding the left ankle. No evidence of fracture, dislocation or bony destruction. The ankle mortise shows normal alignment. No bony lesions or visible bony infarcts by x-ray. No significant degenerative changes.  IMPRESSION: Soft tissue swelling surrounding left ankle. No underlying bony abnormalities are identified.   Electronically Signed   By: Aletta Edouard M.D.   On: 08/24/2014 08:35   Ct Angio Chest Pe W/cm &/or Wo Cm  08/29/2014   CLINICAL DATA:  Chest pain.  Sickle cell.  Recent pulmonary embolism  EXAM: CT ANGIOGRAPHY CHEST WITH CONTRAST  TECHNIQUE: Multidetector CT imaging of the chest was performed using the standard protocol during bolus administration of intravenous contrast. Multiplanar CT image  reconstructions and MIPs were obtained to evaluate the vascular anatomy.  CONTRAST:  17mL OMNIPAQUE IOHEXOL 350 MG/ML SOLN  COMPARISON:  CT chest 07/20/2014  FINDINGS: Small filling defects in the lower lobe pulmonary arteries are unchanged from the prior study and consistent with residual pulmonary emboli which are not dissolved. No new emboli identified. It is unlikely these are recurrent emboli as the are in the exact same location as the prior study.  Cardiac enlargement without pericardial effusion. Right ventricular enlargement. RV/LV ratio is 1.5 suggesting right heart strain. Pulmonary  arteries are enlarged.  Port-A-Cath in the SVC unchanged.  Because  Anterior mediastinal adenopathy measuring up to 12 mm unchanged. No hilar adenopathy.  Patchy airspace densities in the posterior lung bases bilaterally unchanged most consistent with atelectasis or scarring. Additional ground-glass density in the lungs bilaterally most notably in the right lung are unchanged and may be related to chronic lung disease or pulmonary emboli.  Upper abdomen negative.  Negative for   effusion.  Review of the MIP images confirms the above findings.  IMPRESSION: Small filling defects in the lower lobe pulmonary arteries unchanged from the CT of 07/20/2014 compatible with residual emboli which have not dissolved. No new emboli identified.  Cardiac enlargement. Pulmonary artery enlargement. Enlarged right ventricle. RV/LV ratio 1.5 consistent with right ventricle strain likely due to pulmonary hypertension.  Critical Value/emergent results were called by telephone at the time of interpretation on 08/29/2014 at 10:28 am to Dr. Gareth Morgan , who verbally acknowledged these results.   Electronically Signed   By: Franchot Gallo M.D.   On: 08/29/2014 10:28   Dg Chest 1v Repeat Same Day  08/29/2014   CLINICAL DATA:  34 year old male with shortness of breath for 1 day. History sickle cell disease.  EXAM: CHEST - 1 VIEW SAME DAY  COMPARISON:  10/21/2014 and prior radiographs  FINDINGS: Cardiomegaly and left subclavian Port-A-Cath with tip overlying the superior cavoatrial junction again noted.  Increased pulmonary vascularity noted.  There is no evidence of focal airspace disease, pulmonary edema, suspicious pulmonary nodule/mass, pleural effusion, or pneumothorax. No acute bony abnormalities are identified.  IMPRESSION: Cardiomegaly and increased pulmonary vascularity without acute abnormality.   Electronically Signed   By: Margarette Canada M.D.   On: 08/29/2014 19:13    Microbiology: Recent Results (from the past 240 hour(s))   Culture, blood (routine x 2)     Status: None   Collection Time: 09/01/14  1:40 PM  Result Value Ref Range Status   Specimen Description BLOOD RIGHT HAND  Final   Special Requests IN PEDIATRIC BOTTLE 4CC  Final   Culture   Final    NO GROWTH 5 DAYS Performed at [facility]    Report Status 09/06/2014 FINAL  Final  Culture, blood (routine x 2)     Status: None   Collection Time: 09/01/14  1:50 PM  Result Value Ref Range Status   Specimen Description BLOOD RIGHT ARM  Final   Special Requests IN PEDIATRIC BOTTLE 4CC  Final   Culture   Final    NO GROWTH 5 DAYS Performed at [facility]    Report Status 09/06/2014 FINAL  Final     Labs: Basic Metabolic Panel:  Recent Labs Lab 09/03/14 0955  NA 135  K 4.1  CL 108  CO2 24  GLUCOSE 110*  BUN 7  CREATININE 0.52*  CALCIUM 8.6*   Liver Function Tests: No results for input(s): AST, ALT, ALKPHOS, BILITOT, PROT, ALBUMIN in  the last 168 hours. No results for input(s): LIPASE, AMYLASE in the last 168 hours. No results for input(s): AMMONIA in the last 168 hours. CBC:  Recent Labs Lab 09/09/14 1125  WBC 22.8*  NEUTROABS 18.3*  HGB 7.9*  HCT 23.6*  MCV 93.3  PLT 753*   Cardiac Enzymes: No results for input(s): CKTOTAL, CKMB, CKMBINDEX, TROPONINI in the last 168 hours. BNP: BNP (last 3 results)  Recent Labs  08/29/14 0810  BNP 1117.0*    ProBNP (last 3 results)  Recent Labs  01/01/14 0909  PROBNP 5790.0*    CBG:  Recent Labs Lab 09/02/14 2123 09/03/14 0731  GLUCAP 132* 102*       Signed:  [name] MD, MHA, FACP, CPE   09/09/2014, 6:57 PM

## 2014-09-09 NOTE — Progress Notes (Signed)
Patient ID: [name], male   DOB: 02-08-1980, 34 y.o.   MRN: [id number]   [name], is a 34 y.o. male  JGG:[id number]  LYY:[id number]  DOB - 07/27/1980  Chief Complaint  Patient presents with  . Follow-up    scd        Subjective:   [name] is a 34 y.o. male here today for a follow up visit. 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 He came to the outpatient clinic today for follow-up but he continues to complain of 10 out of 10 pain, he claims he ran out of his pain medications and was waiting to get them refilled today. He was recently admitted to the hospital for atrial fibrillation with RVR, bacteremia, volume depletion, paronychial infection of left great toe, sickle-cell crisis, and elevated troponins. 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 Patient will like to be admitted again to the day hospital for management of pain. He has no fever, denies chest pain, cough is occasionally, shortness of breath is at baseline.  Problem  Chronic Atrial Fibrillation    ALLERGIES: No Known Allergies  PAST MEDICAL HISTORY: Past Medical History  Diagnosis Date  . Sickle cell anemia   . Blood transfusion   . Acute embolism and thrombosis of right internal jugular vein   . Hypokalemia   . Mood disorder   . History of pulmonary embolus (PE)   . Avascular necrosis   . Leukocytosis     Chronic  . Thrombocytosis     Chronic  . Hypertension   . History of Clostridium difficile infection   . Uses marijuana   . Chronic anticoagulation   . Functional asplenia   . Former smoker   . Second hand tobacco smoke  exposure   . Alcohol consumption of one to four drinks per day   . Noncompliance with medication regimen   . Sickle-cell crisis with associated acute chest syndrome 05/13/2013  . Acute chest syndrome 06/18/2013  . Demand ischemia 01/02/2014    MEDICATIONS AT HOME: Prior to Admission medications   Medication Sig Start Date End Date Taking? Authorizing Provider  Cholecalciferol (VITAMIN D) 2000 UNITS tablet Take 1 tablet (2,000 Units total) by mouth daily. 02/13/14  Yes [name], MD  diltiazem (CARDIZEM CD) 120 MG 24 hr capsule Take 1 capsule (120 mg total) by mouth daily. 03/13/14  Yes Costin Karlyne Greenspan, MD  enoxaparin (LOVENOX) 150 MG/ML injection Inject 0.73 mLs (110 mg total) into the skin daily. Starting tonight 07/23/2014 at 10:00pm Patient taking differently: Inject 110 mg into the skin daily.  08/12/14  Yes Micheline Chapman, NP  folic acid (FOLVITE) 1 MG tablet Take 1 tablet (1 mg total) by mouth every morning. 10/08/13  Yes [name], MD  HYDROmorphone (DILAUDID) 4 MG tablet Take 1 tablet (4 mg total) by mouth every 4 (four) hours as needed for severe pain. 09/09/14  Yes Tresa Garter, MD  hydroxyurea (HYDREA) 500 MG capsule Take 3 capsules (1,500 mg total) by mouth daily. May take with food to minimize GI side effects. 09/09/14  Yes Zyheir Daft Essie Christine,  MD  lisinopril (PRINIVIL,ZESTRIL) 10 MG tablet Take 1 tablet (10 mg total) by mouth daily. 01/22/14  Yes [name], MD  metoprolol tartrate (LOPRESSOR) 25 MG tablet Take 1 tablet (25 mg total) by mouth daily. 08/22/13  Yes [name], MD  morphine (MS CONTIN) 30 MG 12 hr tablet Take 1 tablet (30 mg total) by mouth every 12 (twelve) hours. 09/09/14  Yes Tresa Garter, MD  mupirocin ointment (BACTROBAN) 2 % Apply 1 application topically 2 (two) times daily. Apply ointment to left great toe twice a day and apply dry dressing 09/01/14  Yes Newt Minion, MD  potassium chloride SA (K-DUR,KLOR-CON) 20 MEQ  tablet Take 1 tablet (20 mEq total) by mouth every morning. 06/09/14  Yes [name], MD  zolpidem (AMBIEN) 10 MG tablet Take 1 tablet (10 mg total) by mouth at bedtime as needed for sleep. 08/08/14  Yes Micheline Chapman, NP  amoxicillin-clavulanate (AUGMENTIN) 875-125 MG per tablet Take 1 tablet by mouth 2 (two) times daily. Patient not taking: Reported on 09/09/2014 09/03/14   [name], MD  aspirin 81 MG chewable tablet Chew 1 tablet (81 mg total) by mouth daily. Patient not taking: Reported on 08/29/2014 07/23/14   [name], MD     Objective:   Filed Vitals:   09/09/14 1020  BP: 142/82  Pulse: 113  Temp: 98.3 F (36.8 C)  TempSrc: Oral  Resp: 22  Height: 6' (1.829 m)  Weight: 149 lb (67.586 kg)  SpO2: 85%    Exam General appearance : Awake, alert, not in any distress. Speech Clear. Not toxic looking HEENT: Atraumatic and Normocephalic, pupils equally reactive to light and accomodation Neck: supple, no JVD. No cervical lymphadenopathy.  Chest: Good air entry bilaterally, no added sounds  CVS: S1 S2 irregular, no murmurs.  Abdomen: Bowel sounds present, Non tender and not distended with no gaurding, rigidity or rebound. Extremities: B/L Lower Ext shows no edema, both legs are warm to touch Neurology: Awake alert, and oriented X 3, CN II-XII intact, Non focal Skin:No Rash  Data Review No results found for: HGBA1C   Assessment & Plan   1. Hb-SS disease with crisis  - hydroxyurea (HYDREA) 500 MG capsule; Take 3 capsules (1,500 mg total) by mouth daily. May take with food to minimize GI side effects.  Dispense: 60 capsule; Refill: 3 - HYDROmorphone (DILAUDID) 4 MG tablet; Take 1 tablet (4 mg total) by mouth every 4 (four) hours as needed for severe pain.  Dispense: 90 tablet; Refill: 0 - Patient continues to have pain 10 out of 10, will transfer to the hospital for proper pain management. - Patient will be scheduled in another outpatient visit for  follow-up and pain management review.  2. Chronic pain syndrome  - morphine (MS CONTIN) 30 MG 12 hr tablet; Take 1 tablet (30 mg total) by mouth every 12 (twelve) hours.  Dispense: 60 tablet; Refill: 0  3. Chronic atrial fibrillation EKG today shows sinus tachycardia, with heart rate of 104 bpm Patient is clinically stable Continue current management.  Patient have been counseled extensively about nutrition and exercise  Return in about 4 weeks (around 10/07/2014), or if symptoms worsen or fail to improve, for Follow up Pain and comorbidities, Sickle Cell Disease.  The patient was given clear instructions to go to ER or return to medical center if symptoms don't improve, worsen or new problems develop. The patient verbalized understanding. The patient was told to call to  get lab results if they haven't heard anything in the next week.   This note has been created with Surveyor, quantity. Any transcriptional errors are unintentional.    [name], MD, [place], [place], [place], [place] and [facility] [place], [place]   09/09/2014, 10:49 AM

## 2014-09-09 NOTE — Discharge Summary (Signed)
PCP: [name], MD  Admit date: 09/08/2014 Discharge date: 09/08/14  Time spent: 30 minutes   Discharge Diagnoses:  Active Problems:   Sickle cell crisis   Discharge Condition: Stable Diet recommendation: Regular  Filed Weights   09/08/14 0944  Weight: 163 lb (73.936 kg)    History of present illness:   Patient presents today for   Hospital Course:  Patient was admitted to the day hospital with sickle cell painful crisis. He was treated with IV Dilaudid PCA, Toradol as well as IV fluids. He improved symptomatically and her pain went down from 4 out of 10 down to 4 out of 10 at the time of discharge. He is to follow-up in the clinic as previously scheduled. And  is to continue with her home medications as per prior to admission.      Discharge Exam: Filed Vitals:   09/08/14 1745  BP: 109/73  Pulse: 105  Temp: 98.7 F (37.1 C)  Resp: 18    General appearance: alert, cooperative and no distress Eyes: conjunctivae/corneas clear. PERRL, EOM's intact. Fundi benign. Neck: no adenopathy, no carotid bruit, no JVD, supple, symmetrical, trachea midline and thyroid not enlarged, symmetric, no tenderness/mass/nodules Back: symmetric, no curvature. ROM normal. No CVA tenderness. Resp: clear to auscultation bilaterally Chest wall: no tenderness Cardio: regular rate and rhythm, S1, S2 normal, no murmur, click, rub or gallop GI: soft, non-tender; bowel sounds normal; no masses, no organomegaly Extremities: extremities normal, atraumatic, no cyanosis or edema Pulses: 2+ and symmetric Skin: Skin color, texture, turgor normal. No rashes or lesions Neurologic: Grossly normal  Discharge Instructions    Current Discharge Medication List    CONTINUE these medications which have NOT CHANGED   Details  Cholecalciferol (VITAMIN D) 2000 UNITS tablet Take 1 tablet (2,000 Units total) by mouth daily. Qty: 90 tablet, Refills: 3   Associated Diagnoses: Vitamin D  deficiency    diltiazem (CARDIZEM CD) 120 MG 24 hr capsule Take 1 capsule (120 mg total) by mouth daily. Qty: 30 capsule, Refills: 1    enoxaparin (LOVENOX) 150 MG/ML injection Inject 0.73 mLs (110 mg total) into the skin daily. Starting tonight 07/23/2014 at 10:00pm Qty: 22 Syringe, Refills: 5    folic acid (FOLVITE) 1 MG tablet Take 1 tablet (1 mg total) by mouth every morning. 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 Qty: 90 tablet, Refills: 0   Associated Diagnoses: Hb-SS disease with crisis    hydroxyurea (HYDREA) 500 MG capsule Take 3 capsules (1,500 mg total) by mouth daily. May take with food to minimize GI side effects. Qty: 60 capsule, Refills: 3    lisinopril (PRINIVIL,ZESTRIL) 10 MG tablet Take 1 tablet (10 mg total) by mouth daily. Qty: 30 tablet, Refills: 1   Associated Diagnoses: Hb-SS disease without crisis; Essential hypertension    metoprolol tartrate (LOPRESSOR) 25 MG tablet Take 1 tablet (25 mg total) by mouth daily. Qty: 30 tablet, Refills: 6   Associated Diagnoses: Essential hypertension    morphine (MS CONTIN) 30 MG 12 hr tablet Take 1 tablet (30 mg total) by mouth every 12 (twelve) hours. Qty: 60 tablet, Refills: 0   Associated Diagnoses: Chronic pain syndrome    potassium chloride SA (K-DUR,KLOR-CON) 20 MEQ tablet Take 1 tablet (20 mEq total) by mouth every morning. Qty: 30 tablet, Refills:  3    zolpidem (AMBIEN) 10 MG tablet Take 1 tablet (10 mg total) by mouth at bedtime as needed for sleep. Qty: 30 tablet, Refills: 0   Associated Diagnoses: Insomnia    amoxicillin-clavulanate (AUGMENTIN) 875-125 MG per tablet Take 1 tablet by mouth 2 (two) times daily. Qty: 10 tablet, Refills: 0    aspirin 81 MG chewable tablet Chew 1 tablet (81 mg total) by mouth daily. Qty: 30 tablet, Refills: 11     mupirocin ointment (BACTROBAN) 2 % Apply 1 application topically 2 (two) times daily. Apply ointment to left great toe twice a day and apply dry dressing Qty: 22 g, Refills: 6       No Known Allergies    The results of significant diagnostics from this hospitalization (including imaging, microbiology, ancillary and laboratory) are listed below for reference.    Significant Diagnostic Studies: Dg Chest 2 View  08/29/2014   CLINICAL DATA:  Sickle cell crisis. Chest pain. Severe bilateral lower extremity pain.  EXAM: CHEST  2 VIEW  COMPARISON:  08/24/2014.  FINDINGS: No change. Artifact overlies the chest. Cardiac silhouette remains enlarged. Mediastinal shadows are otherwise normal. Power port inserted from the left subclavian vein has its tip in the SVC just above the right atrium. Vascular plethora remains present without edema or effusions. Pulmonary scarring remains present.  IMPRESSION: No acute finding. Cardiomegaly. Pulmonary vascular plethora. Pulmonary scarring.   Electronically Signed   By: Nelson Chimes M.D.   On: 08/29/2014 08:49   Dg Chest 2 View  08/24/2014   CLINICAL DATA:  Sickle cell crisis, shortness of breath  EXAM: CHEST  2 VIEW  COMPARISON:  08/05/2014  FINDINGS: Moderate enlargement of the cardiac silhouette is reidentified. Diffusely prominent interstitial markings are stable with areas of linear probable superimposed scarring. No pleural effusion. Cholecystectomy clips are noted. No acute osseous finding. Left-sided Port-A-Cath in place with tip at the cavoatrial junction.  IMPRESSION: No focal acute finding.   Electronically Signed   By: Conchita Paris M.D.   On: 08/24/2014 08:41   Dg Ankle Complete Left  08/24/2014   CLINICAL DATA:  Sickle cell crisis with left lower leg and ankle pain and swelling.  EXAM: LEFT ANKLE COMPLETE - 3+ VIEW  COMPARISON:  12/21/2006  FINDINGS: Diffuse soft tissue swelling is seen surrounding the left ankle. No evidence of fracture, dislocation  or bony destruction. The ankle mortise shows normal alignment. No bony lesions or visible bony infarcts by x-ray. No significant degenerative changes.  IMPRESSION: Soft tissue swelling surrounding left ankle. No underlying bony abnormalities are identified.   Electronically Signed   By: Aletta Edouard M.D.   On: 08/24/2014 08:35   Ct Angio Chest Pe W/cm &/or Wo Cm  08/29/2014   CLINICAL DATA:  Chest pain.  Sickle cell.  Recent pulmonary embolism  EXAM: CT ANGIOGRAPHY CHEST WITH CONTRAST  TECHNIQUE: Multidetector CT imaging of the chest was performed using the standard protocol during bolus administration of intravenous contrast. Multiplanar CT image reconstructions and MIPs were obtained to evaluate the vascular anatomy.  CONTRAST:  166mL OMNIPAQUE IOHEXOL 350 MG/ML SOLN  COMPARISON:  CT chest 07/20/2014  FINDINGS: Small filling defects in the lower lobe pulmonary arteries are unchanged from the prior study and consistent with residual pulmonary emboli which are not dissolved. No new emboli identified. It is unlikely these are recurrent emboli as the are in the exact same location as the prior study.  Cardiac enlargement without pericardial effusion. Right ventricular enlargement. RV/LV ratio  is 1.5 suggesting right heart strain. Pulmonary arteries are enlarged.  Port-A-Cath in the SVC unchanged.  Because  Anterior mediastinal adenopathy measuring up to 12 mm unchanged. No hilar adenopathy.  Patchy airspace densities in the posterior lung bases bilaterally unchanged most consistent with atelectasis or scarring. Additional ground-glass density in the lungs bilaterally most notably in the right lung are unchanged and may be related to chronic lung disease or pulmonary emboli.  Upper abdomen negative.  Negative for   effusion.  Review of the MIP images confirms the above findings.  IMPRESSION: Small filling defects in the lower lobe pulmonary arteries unchanged from the CT of 07/20/2014 compatible with residual  emboli which have not dissolved. No new emboli identified.  Cardiac enlargement. Pulmonary artery enlargement. Enlarged right ventricle. RV/LV ratio 1.5 consistent with right ventricle strain likely due to pulmonary hypertension.  Critical Value/emergent results were called by telephone at the time of interpretation on 08/29/2014 at 10:28 am to Dr. Gareth Morgan , who verbally acknowledged these results.   Electronically Signed   By: Franchot Gallo M.D.   On: 08/29/2014 10:28   Dg Chest 1v Repeat Same Day  08/29/2014   CLINICAL DATA:  34 year old male with shortness of breath for 1 day. History sickle cell disease.  EXAM: CHEST - 1 VIEW SAME DAY  COMPARISON:  10/21/2014 and prior radiographs  FINDINGS: Cardiomegaly and left subclavian Port-A-Cath with tip overlying the superior cavoatrial junction again noted.  Increased pulmonary vascularity noted.  There is no evidence of focal airspace disease, pulmonary edema, suspicious pulmonary nodule/mass, pleural effusion, or pneumothorax. No acute bony abnormalities are identified.  IMPRESSION: Cardiomegaly and increased pulmonary vascularity without acute abnormality.   Electronically Signed   By: Margarette Canada M.D.   On: 08/29/2014 19:13    Microbiology: Recent Results (from the past 240 hour(s))  Culture, blood (routine x 2)     Status: None   Collection Time: 09/01/14  1:40 PM  Result Value Ref Range Status   Specimen Description BLOOD RIGHT HAND  Final   Special Requests IN PEDIATRIC BOTTLE 4CC  Final   Culture   Final    NO GROWTH 5 DAYS Performed at [facility]    Report Status 09/06/2014 FINAL  Final  Culture, blood (routine x 2)     Status: None   Collection Time: 09/01/14  1:50 PM  Result Value Ref Range Status   Specimen Description BLOOD RIGHT ARM  Final   Special Requests IN PEDIATRIC BOTTLE 4CC  Final   Culture   Final    NO GROWTH 5 DAYS Performed at [facility]    Report Status 09/06/2014 FINAL  Final      Labs: Basic Metabolic Panel:  Recent Labs Lab 09/03/14 0955  NA 135  K 4.1  CL 108  CO2 24  GLUCOSE 110*  BUN 7  CREATININE 0.52*  CALCIUM 8.6*   Liver Function Tests: No results for input(s): AST, ALT, ALKPHOS, BILITOT, PROT, ALBUMIN in the last 168 hours. No results for input(s): LIPASE, AMYLASE in the last 168 hours. No results for input(s): AMMONIA in the last 168 hours. CBC:  Recent Labs Lab 09/02/14 0956  WBC 17.5*  NEUTROABS 11.8*  HGB 7.0*  HCT 20.5*  MCV 95.8  PLT 410*   Cardiac Enzymes: No results for input(s): CKTOTAL, CKMB, CKMBINDEX, TROPONINI in the last 168 hours. BNP: BNP (last 3 results)  Recent Labs  08/29/14 0810  BNP 1117.0*    ProBNP (last  3 results)  Recent Labs  01/01/14 0909  PROBNP 5790.0*    CBG:  Recent Labs Lab 09/02/14 1019 09/02/14 1634 09/02/14 2123 09/03/14 0731  GLUCAP 105* 114* 132* 102*       Signed:  Comer Devins FNP-BC   09/09/2014, 9:44 AM

## 2014-09-09 NOTE — Progress Notes (Signed)
Pt received to [facility] for treatment. Pain was 5/10 at discharge. Discharge instructions reviewed. Pt verbalize no other needs at this time. Pt alert and ambulatory. States family here to give him a ride home.

## 2014-09-09 NOTE — Patient Instructions (Signed)
Sickle Cell Anemia, Adult °Sickle cell anemia is a condition in which red blood cells have an abnormal "sickle" shape. This abnormal shape shortens the cells' life span, which results in a lower than normal concentration of red blood cells in the blood. The sickle shape also causes the cells to clump together and block free blood flow through the blood vessels. As a result, the tissues and organs of the body do not receive enough oxygen. Sickle cell anemia causes organ damage and pain and increases the risk of infection. °CAUSES  °Sickle cell anemia is a genetic disorder. Those who receive two copies of the gene have the condition, and those who receive one copy have the trait. °RISK FACTORS °The sickle cell gene is most common in people whose families originated in [place]. Other areas of the globe where sickle cell trait occurs include the [place], [place] and [place], the [place], and the [place].  °SIGNS AND SYMPTOMS °· Pain, especially in the extremities, back, chest, or abdomen (common). The pain may start suddenly or may develop following an illness, especially if there is dehydration. Pain can also occur due to overexertion or exposure to extreme temperature changes. °· Frequent severe bacterial infections, especially certain types of pneumonia and meningitis. °· Pain and swelling in the hands and feet. °· Decreased activity.   °· Loss of appetite.   °· Change in behavior. °· Headaches. °· Seizures. °· Shortness of breath or difficulty breathing. °· Vision changes. °· Skin ulcers. °Those with the trait may not have symptoms or they may have mild symptoms.  °DIAGNOSIS  °Sickle cell anemia is diagnosed with blood tests that demonstrate the genetic trait. It is often diagnosed during the newborn period, due to mandatory testing nationwide. A variety of blood tests, X-rays, CT scans, MRI scans, ultrasounds, and lung function tests may also be done to monitor the condition. °TREATMENT  °Sickle  cell anemia may be treated with: °· Medicines. You may be given pain medicines, antibiotic medicines (to treat and prevent infections) or medicines to increase the production of certain types of hemoglobin. °· Fluids. °· Oxygen. °· Blood transfusions. °HOME CARE INSTRUCTIONS  °· Drink enough fluid to keep your urine clear or pale yellow. Increase your fluid intake in hot weather and during exercise. °· Do not smoke. Smoking lowers oxygen levels in the blood.   °· Only take over-the-counter or prescription medicines for pain, fever, or discomfort as directed by your health care provider. °· Take antibiotics as directed by your health care provider. Make sure you finish them it even if you start to feel better.   °· Take supplements as directed by your health care provider.   °· Consider wearing a medical alert bracelet. This tells anyone caring for you in an emergency of your condition.   °· When traveling, keep your medical information, health care provider's names, and the medicines you take with you at all times.   °· If you develop a fever, do not take medicines to reduce the fever right away. This could cover up a problem that is developing. Notify your health care provider. °· Keep all follow-up appointments with your health care provider. Sickle cell anemia requires regular medical care. °SEEK MEDICAL CARE IF: ° You have a fever. °SEEK IMMEDIATE MEDICAL CARE IF:  °· You feel dizzy or faint.   °· You have new abdominal pain, especially on the left side near the stomach area.   °· You develop a persistent, often uncomfortable and painful penile erection (priapism). If this is not treated immediately it   will lead to impotence.   °· You have numbness your arms or legs or you have a hard time moving them.   °· You have a hard time with speech.   °· You have a fever or persistent symptoms for more than 2-3 days.   °· You have a fever and your symptoms suddenly get worse.   °· You have signs or symptoms of infection.  These include:   °¨ Chills.   °¨ Abnormal tiredness (lethargy).   °¨ Irritability.   °¨ Poor eating.   °¨ Vomiting.   °· You develop pain that is not helped with medicine.   °· You develop shortness of breath. °· You have pain in your chest.   °· You are coughing up pus-like or bloody sputum.   °· You develop a stiff neck. °· Your feet or hands swell or have pain. °· Your abdomen appears bloated. °· You develop joint pain. °MAKE SURE YOU: °· Understand these instructions. °Document Released: 04/27/2005 Document Revised: 06/03/2013 Document Reviewed: 08/29/2012 °ExitCare® Patient Information ©2015 [employer]. This information is not intended to replace advice given to you by your health care provider. Make sure you discuss any questions you have with your health care provider. ° °

## 2014-09-09 NOTE — H&P (Signed)
[facility] History and Physical  [name] [name] DOB: 1980/04/18 DOA: 09/09/2014  PCP: [name], MD   Chief Complaint: Pain  HPI: [name] is a 34 y.o. male patient was seen and the day hospital today for routine follow-up, he complained of ongoing pain that has been going on for more than 2 weeks for which patient has been admitted to the hospital and discharged and also treated here yesterday with PCA Dilaudid and IV Toradol. He claims pain is still 10 out of 10 and he would like to be admitted again for day treatment. He has no fever, denies chest pain, he is short of breath but at baseline, he uses oxygen at home at about 3 L while ambulating and while sleeping, although patient said he needs the oxygen tank to be activated by advised home care. He denies any abdominal pain, no nausea or vomiting, no diarrhea.    General: The patient denies anorexia, fever, weight loss Cardiac: Denies chest pain, syncope, palpitations, pedal edema  Respiratory: Denies cough, shortness of breath, wheezing GI: Denies severe indigestion/heartburn, abdominal pain, nausea, vomiting, diarrhea and constipation GU: Denies hematuria, incontinence, dysuria  Musculoskeletal: Denies arthritis  Skin: Denies suspicious skin lesions Neurologic: Denies focal weakness or numbness, change in vision  Past Medical History  Diagnosis Date  . Sickle cell anemia   . Blood transfusion   . Acute embolism and thrombosis of right internal jugular vein   . Hypokalemia   . Mood disorder   . History of pulmonary embolus (PE)   . Avascular necrosis   . Leukocytosis     Chronic  . Thrombocytosis     Chronic  . Hypertension   . History of Clostridium difficile infection   . Uses marijuana   . Chronic anticoagulation   . Functional asplenia   . Former smoker   . Second hand tobacco smoke exposure   . Alcohol consumption of one to four drinks per day   . Noncompliance with  medication regimen   . Sickle-cell crisis with associated acute chest syndrome 05/13/2013  . Acute chest syndrome 06/18/2013  . Demand ischemia 01/02/2014    Past Surgical History  Procedure Laterality Date  . Right hip replacement      08/2006  . Cholecystectomy      01/2008  . Porta cath placement    . Porta cath removal    . Umbilical hernia repair      01/2008  . Excision of left periauricular cyst      10/2009  . Excision of right ear lobe cyst with primary closur      11/2007  . Portacath placement  01/05/2012    Procedure: INSERTION PORT-A-CATH;  Surgeon: Odis Hollingshead, MD;  Location: [facility];  Service: General;  Laterality: N/A;  ultrasound guiced port a cath insertion with fluoroscopy   No Known Allergies  Family History  Problem Relation Age of Onset  . Sickle cell trait Mother   . Depression Mother   . Diabetes Mother   . Sickle cell trait Father   . Sickle cell trait Brother       Prior to Admission medications   Medication Sig Start Date End Date Taking? Authorizing Provider  aspirin 81 MG chewable tablet Chew 1 tablet (81 mg total) by mouth daily. 07/23/14  Yes [name], MD  Cholecalciferol (VITAMIN D) 2000 UNITS tablet Take 1 tablet (2,000 Units total) by mouth daily. 02/13/14  Yes Sharyn Lull A  Zigmund Daniel, MD  diltiazem (CARDIZEM CD) 120 MG 24 hr capsule Take 1 capsule (120 mg total) by mouth daily. 03/13/14  Yes Costin Karlyne Greenspan, MD  enoxaparin (LOVENOX) 150 MG/ML injection Inject 0.73 mLs (110 mg total) into the skin daily. Starting tonight 07/23/2014 at 10:00pm Patient taking differently: Inject 110 mg into the skin daily.  08/12/14  Yes Micheline Chapman, NP  folic acid (FOLVITE) 1 MG tablet Take 1 tablet (1 mg total) by mouth every morning. 10/08/13  Yes [name], MD  HYDROmorphone (DILAUDID) 4 MG tablet Take 1 tablet (4 mg total) by mouth every 4 (four) hours as needed for severe pain. 09/09/14  Yes Tresa Garter, MD  hydroxyurea (HYDREA)  500 MG capsule Take 3 capsules (1,500 mg total) by mouth daily. May take with food to minimize GI side effects. 09/09/14  Yes Tresa Garter, MD  lisinopril (PRINIVIL,ZESTRIL) 10 MG tablet Take 1 tablet (10 mg total) by mouth daily. 01/22/14  Yes [name], MD  metoprolol tartrate (LOPRESSOR) 25 MG tablet Take 1 tablet (25 mg total) by mouth daily. 08/22/13  Yes [name], MD  morphine (MS CONTIN) 30 MG 12 hr tablet Take 1 tablet (30 mg total) by mouth every 12 (twelve) hours. 09/09/14  Yes Tresa Garter, MD  potassium chloride SA (K-DUR,KLOR-CON) 20 MEQ tablet Take 1 tablet (20 mEq total) by mouth every morning. 06/09/14  Yes [name], MD  zolpidem (AMBIEN) 10 MG tablet Take 1 tablet (10 mg total) by mouth at bedtime as needed for sleep. 08/08/14  Yes Micheline Chapman, NP  amoxicillin-clavulanate (AUGMENTIN) 875-125 MG per tablet Take 1 tablet by mouth 2 (two) times daily. Patient not taking: Reported on 09/09/2014 09/03/14   [name], MD  mupirocin ointment (BACTROBAN) 2 % Apply 1 application topically 2 (two) times daily. Apply ointment to left great toe twice a day and apply dry dressing 09/01/14   Newt Minion, MD     Physical Exam: Filed Vitals:   09/09/14 1103  BP: 115/75  Pulse: 104  Temp: 97.9 F (36.6 C)  Resp: 22  Height: 6' (1.829 m)  Weight: 150 lb (68.04 kg)  SpO2: 91%     General: Alert, awake, afebrile, mild icterus, not in obvious distress HEENT: Normocephalic and Atraumatic, Mucous membranes pink                PERRLA; EOM intact; No scleral icterus,                 Nares: Patent, Oropharynx: Clear, Fair Dentition                 Neck: FROM, no cervical lymphadenopathy, thyromegaly, carotid bruit or JVD;  Breasts: deferred CHEST WALL: No tenderness  CHEST: Normal respiration, clear to auscultation bilaterally  HEART: Regular rate and rhythm; no murmurs rubs or gallops  BACK: No kyphosis or scoliosis; no CVA tenderness   ABDOMEN: Positive Bowel Sounds, soft, non-tender; no masses, no organomegaly Rectal Exam: deferred EXTREMITIES: No cyanosis, clubbing, or edema Genitalia: not examined  SKIN:  no rash or ulceration  CNS: Alert and Oriented x 4, Nonfocal exam, CN 2-12 intact  Labs on Admission:  Basic Metabolic Panel:  Recent Labs Lab 09/03/14 0955  NA 135  K 4.1  CL 108  CO2 24  GLUCOSE 110*  BUN 7  CREATININE 0.52*  CALCIUM 8.6*   Liver Function Tests: No results for input(s): AST, ALT, ALKPHOS, BILITOT,  PROT, ALBUMIN in the last 168 hours. No results for input(s): LIPASE, AMYLASE in the last 168 hours. No results for input(s): AMMONIA in the last 168 hours. CBC: No results for input(s): WBC, NEUTROABS, HGB, HCT, MCV, PLT in the last 168 hours. Cardiac Enzymes: No results for input(s): CKTOTAL, CKMB, CKMBINDEX, TROPONINI in the last 168 hours.  BNP (last 3 results)  Recent Labs  08/29/14 0810  BNP 1117.0*    ProBNP (last 3 results)  Recent Labs  01/01/14 0909  PROBNP 5790.0*    CBG:  Recent Labs Lab 09/02/14 1634 09/02/14 2123 09/03/14 0731  GLUCAP 114* 132* 102*     Assessment/Plan Active Problems:   Sickle cell anemia with pain   Admits to the Day Hospital  IVF D5 .45% Saline @ 125 mls/hour  Weight based Dilaudid PCA  IV Toradol  Monitor vitals very closely  Patient will be re-evaluated for pain in the context of function and relationship to baseline as care progresses.  If no significant relieve from pain (remains above 5/10) will transfer patient to inpatient services for further evaluation and management  Code Status: Full Family Communication: None DVT Prophylaxis: Ambulate as tolerated   Time spent: 45  Jackob Crookston, MD, MHA, [place], CPE   If 7PM-7AM, please contact night-coverage [email or web address] 09/09/2014, 11:14 AM

## 2014-09-09 NOTE — Progress Notes (Addendum)
Wasted 61mls of Dilaudid high concentration PCA in sink. Witnessed by [place]. Unable to co sign properly but I did witness waste.

## 2014-09-10 ENCOUNTER — Inpatient Hospital Stay (HOSPITAL_COMMUNITY): Payer: Medicare Other

## 2014-09-10 ENCOUNTER — Encounter (HOSPITAL_COMMUNITY): Payer: Self-pay | Admitting: Emergency Medicine

## 2014-09-10 ENCOUNTER — Emergency Department (HOSPITAL_COMMUNITY): Payer: Medicare Other

## 2014-09-10 ENCOUNTER — Inpatient Hospital Stay (HOSPITAL_COMMUNITY)
Admission: EM | Admit: 2014-09-10 | Discharge: 2014-09-16 | DRG: 811 | Disposition: A | Discharge status: Home / Self Care | Payer: Medicare Other | Attending: Internal Medicine | Admitting: Internal Medicine

## 2014-09-10 DIAGNOSIS — Z7901 Long term (current) use of anticoagulants: Secondary | ICD-10-CM

## 2014-09-10 DIAGNOSIS — D5701 Hb-SS disease with acute chest syndrome: Secondary | ICD-10-CM

## 2014-09-10 DIAGNOSIS — I270 Primary pulmonary hypertension: Secondary | ICD-10-CM | POA: Diagnosis not present

## 2014-09-10 DIAGNOSIS — E876 Hypokalemia: Secondary | ICD-10-CM | POA: Diagnosis not present

## 2014-09-10 DIAGNOSIS — I509 Heart failure, unspecified: Secondary | ICD-10-CM | POA: Diagnosis not present

## 2014-09-10 DIAGNOSIS — R0902 Hypoxemia: Secondary | ICD-10-CM | POA: Insufficient documentation

## 2014-09-10 DIAGNOSIS — D5700 Hb-SS disease with crisis, unspecified: Secondary | ICD-10-CM | POA: Diagnosis present

## 2014-09-10 DIAGNOSIS — R7989 Other specified abnormal findings of blood chemistry: Secondary | ICD-10-CM

## 2014-09-10 DIAGNOSIS — I493 Ventricular premature depolarization: Secondary | ICD-10-CM | POA: Diagnosis present

## 2014-09-10 DIAGNOSIS — J9690 Respiratory failure, unspecified, unspecified whether with hypoxia or hypercapnia: Secondary | ICD-10-CM | POA: Diagnosis not present

## 2014-09-10 DIAGNOSIS — I2781 Cor pulmonale (chronic): Secondary | ICD-10-CM | POA: Diagnosis present

## 2014-09-10 DIAGNOSIS — D638 Anemia in other chronic diseases classified elsewhere: Secondary | ICD-10-CM | POA: Diagnosis not present

## 2014-09-10 DIAGNOSIS — J9621 Acute and chronic respiratory failure with hypoxia: Secondary | ICD-10-CM | POA: Diagnosis not present

## 2014-09-10 DIAGNOSIS — I10 Essential (primary) hypertension: Secondary | ICD-10-CM | POA: Diagnosis present

## 2014-09-10 DIAGNOSIS — J9611 Chronic respiratory failure with hypoxia: Secondary | ICD-10-CM | POA: Diagnosis present

## 2014-09-10 DIAGNOSIS — Z96641 Presence of right artificial hip joint: Secondary | ICD-10-CM | POA: Diagnosis present

## 2014-09-10 DIAGNOSIS — Z9981 Dependence on supplemental oxygen: Secondary | ICD-10-CM | POA: Diagnosis not present

## 2014-09-10 DIAGNOSIS — I2699 Other pulmonary embolism without acute cor pulmonale: Secondary | ICD-10-CM

## 2014-09-10 DIAGNOSIS — J9601 Acute respiratory failure with hypoxia: Secondary | ICD-10-CM

## 2014-09-10 DIAGNOSIS — Z9119 Patient's noncompliance with other medical treatment and regimen: Secondary | ICD-10-CM | POA: Diagnosis present

## 2014-09-10 DIAGNOSIS — Z86711 Personal history of pulmonary embolism: Secondary | ICD-10-CM | POA: Diagnosis present

## 2014-09-10 DIAGNOSIS — R9431 Abnormal electrocardiogram [ECG] [EKG]: Secondary | ICD-10-CM | POA: Diagnosis not present

## 2014-09-10 DIAGNOSIS — D730 Hyposplenism: Secondary | ICD-10-CM

## 2014-09-10 DIAGNOSIS — Z7982 Long term (current) use of aspirin: Secondary | ICD-10-CM | POA: Diagnosis not present

## 2014-09-10 DIAGNOSIS — I272 Pulmonary hypertension, unspecified: Secondary | ICD-10-CM | POA: Diagnosis present

## 2014-09-10 DIAGNOSIS — Q8901 Asplenia (congenital): Secondary | ICD-10-CM

## 2014-09-10 DIAGNOSIS — Z87891 Personal history of nicotine dependence: Secondary | ICD-10-CM | POA: Diagnosis not present

## 2014-09-10 DIAGNOSIS — G894 Chronic pain syndrome: Secondary | ICD-10-CM | POA: Diagnosis not present

## 2014-09-10 DIAGNOSIS — R0989 Other specified symptoms and signs involving the circulatory and respiratory systems: Secondary | ICD-10-CM | POA: Diagnosis not present

## 2014-09-10 DIAGNOSIS — I5022 Chronic systolic (congestive) heart failure: Secondary | ICD-10-CM | POA: Diagnosis not present

## 2014-09-10 DIAGNOSIS — I2789 Other specified pulmonary heart diseases: Secondary | ICD-10-CM | POA: Diagnosis not present

## 2014-09-10 LAB — BLOOD GAS, ARTERIAL
ACID-BASE DEFICIT: 2.9 mmol/L — AB (ref 0.0–2.0)
Acid-base deficit: 4 mmol/L — ABNORMAL HIGH (ref 0.0–2.0)
BICARBONATE: 20.5 meq/L (ref 20.0–24.0)
BICARBONATE: 20.2 meq/L (ref 20.0–24.0)
DRAWN BY: 308601
DRAWN BY: 103701
O2 CONTENT: 6 L/min
O2 Content: 3 L/min
O2 SAT: 91.4 %
O2 SAT: 87 %
PCO2 ART: 31.8 mmHg — AB (ref 35.0–45.0)
PCO2 ART: 35.5 mmHg (ref 35.0–45.0)
PH ART: 7.427 (ref 7.350–7.450)
PO2 ART: 72.2 mmHg — AB (ref 80.0–100.0)
Patient temperature: 98.6
Patient temperature: 98.6
TCO2: 19.7 mmol/L (ref 0–100)
TCO2: 19.6 mmol/L (ref 0–100)
pH, Arterial: 7.374 (ref 7.350–7.450)
pO2, Arterial: 67.3 mmHg — ABNORMAL LOW (ref 80.0–100.0)

## 2014-09-10 LAB — CBC WITH DIFFERENTIAL/PLATELET
BASOS ABS: 0 10*3/uL (ref 0.0–0.1)
Basophils Relative: 0 % (ref 0–1)
Eosinophils Absolute: 0.2 10*3/uL (ref 0.0–0.7)
Eosinophils Relative: 1 % (ref 0–5)
HEMATOCRIT: 21.4 % — AB (ref 39.0–52.0)
Hemoglobin: 7.3 g/dL — ABNORMAL LOW (ref 13.0–17.0)
LYMPHS ABS: 1.1 10*3/uL (ref 0.7–4.0)
Lymphocytes Relative: 5 % — ABNORMAL LOW (ref 12–46)
MCH: 31.7 pg (ref 26.0–34.0)
MCHC: 34.1 g/dL (ref 30.0–36.0)
MCV: 93 fL (ref 78.0–100.0)
Monocytes Absolute: 2.4 10*3/uL — ABNORMAL HIGH (ref 0.1–1.0)
Monocytes Relative: 11 % (ref 3–12)
Neutro Abs: 18.2 10*3/uL — ABNORMAL HIGH (ref 1.7–7.7)
Neutrophils Relative %: 83 % — ABNORMAL HIGH (ref 43–77)
PLATELETS: 737 10*3/uL — AB (ref 150–400)
RBC: 2.3 MIL/uL — ABNORMAL LOW (ref 4.22–5.81)
RDW: 21.9 % — ABNORMAL HIGH (ref 11.5–15.5)
WBC: 21.9 10*3/uL — AB (ref 4.0–10.5)

## 2014-09-10 LAB — I-STAT CG4 LACTIC ACID, ED
Lactic Acid, Venous: 0.56 mmol/L (ref 0.5–2.0)
Lactic Acid, Venous: 0.31 mmol/L — ABNORMAL LOW (ref 0.5–2.0)

## 2014-09-10 LAB — COMPREHENSIVE METABOLIC PANEL
ALK PHOS: 67 U/L (ref 38–126)
ALT: 27 U/L (ref 17–63)
ANION GAP: 8 (ref 5–15)
AST: 41 U/L (ref 15–41)
Albumin: 3.8 g/dL (ref 3.5–5.0)
BUN: 14 mg/dL (ref 6–20)
CALCIUM: 8.9 mg/dL (ref 8.9–10.3)
CO2: 22 mmol/L (ref 22–32)
Chloride: 109 mmol/L (ref 101–111)
Creatinine, Ser: 0.81 mg/dL (ref 0.61–1.24)
Glucose, Bld: 115 mg/dL — ABNORMAL HIGH (ref 65–99)
POTASSIUM: 3.2 mmol/L — AB (ref 3.5–5.1)
Sodium: 139 mmol/L (ref 135–145)
Total Bilirubin: 3.7 mg/dL — ABNORMAL HIGH (ref 0.3–1.2)
Total Protein: 7.9 g/dL (ref 6.5–8.1)

## 2014-09-10 LAB — RETICULOCYTES
RBC.: 2.23 MIL/uL — AB (ref 4.22–5.81)
Retic Count, Absolute: 419.2 10*3/uL — ABNORMAL HIGH (ref 19.0–186.0)
Retic Ct Pct: 18.8 % — ABNORMAL HIGH (ref 0.4–3.1)

## 2014-09-10 LAB — TROPONIN I
TROPONIN I: 3.48 ng/mL — AB (ref <0.031)
Troponin I: 6.34 ng/mL (ref <0.031)
Troponin I: 7.76 ng/mL (ref <0.031)

## 2014-09-10 LAB — D-DIMER, QUANTITATIVE (NOT AT ARMC): D DIMER QUANT: 2.48 ug{FEU}/mL — AB (ref 0.00–0.48)

## 2014-09-10 LAB — BRAIN NATRIURETIC PEPTIDE: B NATRIURETIC PEPTIDE 5: 549.5 pg/mL — AB (ref 0.0–100.0)

## 2014-09-10 LAB — PREPARE RBC (CROSSMATCH)

## 2014-09-10 LAB — MAGNESIUM: MAGNESIUM: 1.6 mg/dL — AB (ref 1.7–2.4)

## 2014-09-10 MED ORDER — IOHEXOL 350 MG/ML SOLN
100.0000 mL | Freq: Once | INTRAVENOUS | Status: AC | PRN
  Administered 2014-09-10: 100 mL via INTRAVENOUS

## 2014-09-10 MED ORDER — POTASSIUM CHLORIDE CRYS ER 20 MEQ PO TBCR
20.0000 meq | EXTENDED_RELEASE_TABLET | Freq: Every morning | ORAL | Status: DC
  Administered 2014-09-10 – 2014-09-16 (×7): 20 meq via ORAL
  Filled 2014-09-10 (×7): qty 1

## 2014-09-10 MED ORDER — ASPIRIN 81 MG PO CHEW
81.0000 mg | CHEWABLE_TABLET | Freq: Every day | ORAL | Status: DC
  Administered 2014-09-10 – 2014-09-16 (×7): 81 mg via ORAL
  Filled 2014-09-10 (×8): qty 1

## 2014-09-10 MED ORDER — VANCOMYCIN HCL IN DEXTROSE 1-5 GM/200ML-% IV SOLN
1000.0000 mg | Freq: Three times a day (TID) | INTRAVENOUS | Status: DC
  Administered 2014-09-10 – 2014-09-12 (×5): 1000 mg via INTRAVENOUS
  Filled 2014-09-10 (×8): qty 200

## 2014-09-10 MED ORDER — MORPHINE SULFATE ER 30 MG PO TBCR
30.0000 mg | EXTENDED_RELEASE_TABLET | Freq: Two times a day (BID) | ORAL | Status: DC
  Administered 2014-09-10 – 2014-09-16 (×13): 30 mg via ORAL
  Filled 2014-09-10 (×13): qty 1

## 2014-09-10 MED ORDER — DIPHENHYDRAMINE HCL 50 MG/ML IJ SOLN
25.0000 mg | Freq: Once | INTRAMUSCULAR | Status: AC
  Administered 2014-09-10: 25 mg via INTRAVENOUS
  Filled 2014-09-10: qty 1

## 2014-09-10 MED ORDER — SODIUM CHLORIDE 0.9 % IV SOLN
INTRAVENOUS | Status: DC
  Administered 2014-09-10 – 2014-09-12 (×4): via INTRAVENOUS

## 2014-09-10 MED ORDER — HYDROMORPHONE 2 MG/ML HIGH CONCENTRATION IV PCA SOLN
INTRAVENOUS | Status: AC
  Administered 2014-09-10: 2 mg via INTRAVENOUS
  Administered 2014-09-10: 3 mg via INTRAVENOUS
  Administered 2014-09-10: 6.6 mg via INTRAVENOUS
  Administered 2014-09-11: 22:00:00 via INTRAVENOUS
  Administered 2014-09-11: 6 mg via INTRAVENOUS
  Administered 2014-09-11: 5.4 mg via INTRAVENOUS
  Administered 2014-09-11: 3.6 mg via INTRAVENOUS
  Administered 2014-09-11: 5.4 mg via INTRAVENOUS
  Administered 2014-09-11: 3.6 mg via INTRAVENOUS
  Administered 2014-09-12: 9.6 mg via INTRAVENOUS
  Administered 2014-09-12: 4.8 mg via INTRAVENOUS
  Administered 2014-09-12: 6 mg via INTRAVENOUS
  Administered 2014-09-12: 0 mg via INTRAVENOUS
  Administered 2014-09-12: 8.4 mg via INTRAVENOUS
  Administered 2014-09-12: 7.2 mg via INTRAVENOUS
  Administered 2014-09-13: 4.2 mg via INTRAVENOUS
  Filled 2014-09-10 (×3): qty 25

## 2014-09-10 MED ORDER — ZOLPIDEM TARTRATE 10 MG PO TABS
10.0000 mg | ORAL_TABLET | Freq: Every evening | ORAL | Status: DC | PRN
  Administered 2014-09-11 – 2014-09-12 (×2): 10 mg via ORAL
  Filled 2014-09-10: qty 2
  Filled 2014-09-10 (×2): qty 1
  Filled 2014-09-10: qty 2

## 2014-09-10 MED ORDER — METOPROLOL TARTRATE 25 MG PO TABS
25.0000 mg | ORAL_TABLET | Freq: Two times a day (BID) | ORAL | Status: DC
  Administered 2014-09-10 – 2014-09-16 (×12): 25 mg via ORAL
  Filled 2014-09-10 (×13): qty 1

## 2014-09-10 MED ORDER — LISINOPRIL 10 MG PO TABS
10.0000 mg | ORAL_TABLET | Freq: Every day | ORAL | Status: DC
  Administered 2014-09-10 – 2014-09-16 (×7): 10 mg via ORAL
  Filled 2014-09-10 (×7): qty 1

## 2014-09-10 MED ORDER — PIPERACILLIN-TAZOBACTAM 3.375 G IVPB
3.3750 g | Freq: Three times a day (TID) | INTRAVENOUS | Status: DC
  Administered 2014-09-11 – 2014-09-16 (×16): 3.375 g via INTRAVENOUS
  Filled 2014-09-10 (×21): qty 50

## 2014-09-10 MED ORDER — HYDROMORPHONE HCL 2 MG/ML IJ SOLN
2.0000 mg | Freq: Once | INTRAMUSCULAR | Status: AC
  Administered 2014-09-10: 2 mg via INTRAVENOUS
  Filled 2014-09-10: qty 1

## 2014-09-10 MED ORDER — SODIUM CHLORIDE 0.9 % IV SOLN
Freq: Once | INTRAVENOUS | Status: AC
  Administered 2014-09-10: 250 mL via INTRAVENOUS

## 2014-09-10 MED ORDER — CETYLPYRIDINIUM CHLORIDE 0.05 % MT LIQD
7.0000 mL | Freq: Two times a day (BID) | OROMUCOSAL | Status: DC
  Administered 2014-09-11 – 2014-09-16 (×8): 7 mL via OROMUCOSAL

## 2014-09-10 MED ORDER — HYDROXYUREA 500 MG PO CAPS
1500.0000 mg | ORAL_CAPSULE | Freq: Every day | ORAL | Status: DC
  Administered 2014-09-10 – 2014-09-16 (×7): 1500 mg via ORAL
  Filled 2014-09-10 (×8): qty 3

## 2014-09-10 MED ORDER — SODIUM CHLORIDE 0.9 % IV BOLUS (SEPSIS)
1000.0000 mL | Freq: Once | INTRAVENOUS | Status: AC
  Administered 2014-09-10: 1000 mL via INTRAVENOUS

## 2014-09-10 MED ORDER — ENOXAPARIN SODIUM 80 MG/0.8ML ~~LOC~~ SOLN
1.0000 mg/kg | Freq: Two times a day (BID) | SUBCUTANEOUS | Status: DC
  Filled 2014-09-10 (×3): qty 0.8

## 2014-09-10 MED ORDER — POLYETHYLENE GLYCOL 3350 17 G PO PACK
17.0000 g | PACK | Freq: Every day | ORAL | Status: DC | PRN
Start: 2014-09-10 — End: 2014-09-16
  Filled 2014-09-10: qty 1

## 2014-09-10 MED ORDER — SODIUM CHLORIDE 0.9 % IJ SOLN
10.0000 mL | INTRAMUSCULAR | Status: DC | PRN
  Administered 2014-09-13: 10 mL
  Administered 2014-09-15: 30 mL
  Administered 2014-09-16 (×2): 10 mL
  Filled 2014-09-10 (×4): qty 40

## 2014-09-10 MED ORDER — FOLIC ACID 1 MG PO TABS
1.0000 mg | ORAL_TABLET | Freq: Every morning | ORAL | Status: DC
  Administered 2014-09-10 – 2014-09-16 (×7): 1 mg via ORAL
  Filled 2014-09-10 (×7): qty 1

## 2014-09-10 MED ORDER — ONDANSETRON HCL 4 MG/2ML IJ SOLN: 4.0000 mg | Freq: Four times a day (QID) | INTRAMUSCULAR | Status: DC | PRN

## 2014-09-10 MED ORDER — VITAMIN D3 25 MCG (1000 UNIT) PO TABS
2000.0000 [IU] | ORAL_TABLET | Freq: Every day | ORAL | Status: DC
  Administered 2014-09-10 – 2014-09-16 (×7): 2000 [IU] via ORAL
  Filled 2014-09-10 (×9): qty 2

## 2014-09-10 MED ORDER — POTASSIUM CHLORIDE CRYS ER 20 MEQ PO TBCR
40.0000 meq | EXTENDED_RELEASE_TABLET | ORAL | Status: AC
  Administered 2014-09-10 (×2): 40 meq via ORAL
  Filled 2014-09-10 (×2): qty 2

## 2014-09-10 MED ORDER — KETOROLAC TROMETHAMINE 30 MG/ML IJ SOLN
30.0000 mg | Freq: Four times a day (QID) | INTRAMUSCULAR | Status: AC
  Administered 2014-09-10 – 2014-09-15 (×20): 30 mg via INTRAVENOUS
  Filled 2014-09-10 (×23): qty 1

## 2014-09-10 MED ORDER — NALOXONE HCL 0.4 MG/ML IJ SOLN: 0.4000 mg | INTRAMUSCULAR | Status: DC | PRN

## 2014-09-10 MED ORDER — DILTIAZEM HCL ER COATED BEADS 120 MG PO CP24
120.0000 mg | ORAL_CAPSULE | Freq: Every day | ORAL | Status: DC
  Administered 2014-09-10 – 2014-09-16 (×7): 120 mg via ORAL
  Filled 2014-09-10 (×7): qty 1

## 2014-09-10 MED ORDER — PIPERACILLIN-TAZOBACTAM 3.375 G IVPB 30 MIN
3.3750 g | Freq: Once | INTRAVENOUS | Status: AC
  Administered 2014-09-11: 3.375 g via INTRAVENOUS
  Filled 2014-09-10: qty 50

## 2014-09-10 MED ORDER — FUROSEMIDE 10 MG/ML IJ SOLN
40.0000 mg | Freq: Once | INTRAMUSCULAR | Status: AC
  Administered 2014-09-11: 40 mg via INTRAVENOUS
  Filled 2014-09-10: qty 4

## 2014-09-10 MED ORDER — MAGNESIUM SULFATE 2 GM/50ML IV SOLN
2.0000 g | Freq: Once | INTRAVENOUS | Status: AC
  Administered 2014-09-10: 2 g via INTRAVENOUS
  Filled 2014-09-10: qty 50

## 2014-09-10 MED ORDER — CHLORHEXIDINE GLUCONATE 0.12 % MT SOLN
15.0000 mL | Freq: Two times a day (BID) | OROMUCOSAL | Status: DC
  Administered 2014-09-10 – 2014-09-15 (×10): 15 mL via OROMUCOSAL
  Filled 2014-09-10 (×12): qty 15

## 2014-09-10 MED ORDER — SODIUM CHLORIDE 0.9 % IJ SOLN
3.0000 mL | Freq: Two times a day (BID) | INTRAMUSCULAR | Status: DC
  Administered 2014-09-11 – 2014-09-15 (×5): 3 mL via INTRAVENOUS

## 2014-09-10 MED ORDER — SENNOSIDES-DOCUSATE SODIUM 8.6-50 MG PO TABS
1.0000 | ORAL_TABLET | Freq: Two times a day (BID) | ORAL | Status: DC
  Administered 2014-09-10 – 2014-09-16 (×11): 1 via ORAL
  Filled 2014-09-10 (×14): qty 1

## 2014-09-10 MED ORDER — MUPIROCIN 2 % EX OINT
1.0000 "application " | TOPICAL_OINTMENT | Freq: Two times a day (BID) | CUTANEOUS | Status: DC
  Administered 2014-09-10 – 2014-09-16 (×13): 1 via TOPICAL
  Filled 2014-09-10 (×8): qty 22

## 2014-09-10 MED ORDER — SODIUM CHLORIDE 0.9 % IJ SOLN: 9.0000 mL | INTRAMUSCULAR | Status: DC | PRN

## 2014-09-10 MED ORDER — ENOXAPARIN SODIUM 120 MG/0.8ML ~~LOC~~ SOLN
110.0000 mg | SUBCUTANEOUS | Status: DC
  Administered 2014-09-10: 110 mg via SUBCUTANEOUS
  Filled 2014-09-10: qty 0.8

## 2014-09-10 MED ORDER — METOPROLOL TARTRATE 25 MG PO TABS
25.0000 mg | ORAL_TABLET | Freq: Every day | ORAL | Status: DC
  Administered 2014-09-10: 25 mg via ORAL
  Filled 2014-09-10: qty 1

## 2014-09-10 NOTE — ED Notes (Signed)
Pt taken to xray 

## 2014-09-10 NOTE — Progress Notes (Signed)
Patient chest xray with no acute opacity or infiltrate, ABG with worsening Hypoxemia, currently sO2 99% on NRB, mildly dyspneic,no use of accessory muscle , D/W ELink, they will monitor closley. Phillips Climes MD

## 2014-09-10 NOTE — Care Management Note (Signed)
Case Management Note  Patient Details  Name: [name] MRN: [id number] Date of Birth: 10/21/1980  Subjective/Objective: 34 y/o m admitted w/elevated troponin.SSC. Readmit-7/24-7/27-SSC.From home.                   Action/Plan:d/c plan home.   Expected Discharge Date:   (unknown)               Expected Discharge Plan:  Home/Self Care  In-House Referral:     Discharge planning Services  CM Consult  Post Acute Care Choice:    Choice offered to:     DME Arranged:    DME Agency:     HH Arranged:    HH Agency:     Status of Service:  In process, will continue to follow  Medicare Important Message Given:    Date Medicare IM Given:    Medicare IM give by:    Date Additional Medicare IM Given:    Additional Medicare Important Message give by:     If discussed at [place] of Stay Meetings, dates discussed:    Additional Comments:  Dessa Phi, RN 09/10/2014, 3:09 PM

## 2014-09-10 NOTE — Progress Notes (Signed)
CRITICAL VALUE ALERT  Critical value received:  Trop 7.76  Date of notification:  8/10  Time of notification:  4:48pm  Critical value read back:Yes.    Nurse who received alert:  Warrick Parisian  MD notified (1st page):  [employer]  Time of first page:  4:49 PM   MD notified (2nd page):  Time of second page:  Responding MD: Rodena Piety  Time MD responded: 4:56 Orders for EKG

## 2014-09-10 NOTE — Consult Note (Signed)
Reason for Consult:   Elevated Troponin  Requesting Physician: Dr Rodena Piety Primary Cardiologist Dr [name] Spanish (never seen in the office)  HPI: This is an unfortunate 34 y.o.AA male with a PMH significant for sickle cell disease. He has had multiple admissions for sickle cell crisis. In June he had a pulmonary embolism (recurrent), felt to be secondary to Xarelto non compliance. He was seen by Dr [name] Spanish then. Echo showed preserved LVF with no WMA and significant pulmonary HTN- PA 76 mmHg with RV and RA enlargement. It was suggested he have a Myoview when he recovered but this was never done. He has been in the ED or admitted 6 times since he was discharge 07/30/14. He was in the hospital 8/8- 09/09/14 with pain and readmitted 8/10 with pain and hypoxia. His D-dimer was elevated - CTA no new areas of emboli. His Troponin has been elevated pk 6.34. Its impossible to get a history of angina as the pt is in chronic pain and when I examined him heavily sedated. His EKG is markedly abnormal but similar to previous EKGs.   PMHx:  Past Medical History  Diagnosis Date  . Sickle cell anemia   . Blood transfusion   . Acute embolism and thrombosis of right internal jugular vein   . Hypokalemia   . Mood disorder   . History of pulmonary embolus (PE)   . Avascular necrosis   . Leukocytosis     Chronic  . Thrombocytosis     Chronic  . Hypertension   . History of Clostridium difficile infection   . Uses marijuana   . Chronic anticoagulation   . Functional asplenia   . Former smoker   . Second hand tobacco smoke exposure   . Alcohol consumption of one to four drinks per day   . Noncompliance with medication regimen   . Sickle-cell crisis with associated acute chest syndrome 05/13/2013  . Acute chest syndrome 06/18/2013  . Demand ischemia 01/02/2014    Past Surgical History  Procedure Laterality Date  . Right hip replacement      08/2006  . Cholecystectomy      01/2008  . Porta cath  placement    . Porta cath removal    . Umbilical hernia repair      01/2008  . Excision of left periauricular cyst      10/2009  . Excision of right ear lobe cyst with primary closur      11/2007  . Portacath placement  01/05/2012    Procedure: INSERTION PORT-A-CATH;  Surgeon: Odis Hollingshead, MD;  Location: [facility];  Service: General;  Laterality: N/A;  ultrasound guiced port a cath insertion with fluoroscopy    SOCHx:  reports that he quit smoking about 4 years ago. He has never used smokeless tobacco. He reports that he uses illicit drugs (Marijuana) about twice per week. He reports that he does not drink alcohol.  FAMHx: Family History  Problem Relation Age of Onset  . Sickle cell trait Mother   . Depression Mother   . Diabetes Mother   . Sickle cell trait Father   . Sickle cell trait Brother     ALLERGIES: No Known Allergies  ROS: Review of systems not obtained due to patient factors.  HOME MEDICATIONS: Prior to Admission medications   Medication Sig Start Date End Date Taking? Authorizing Provider  aspirin 81 MG chewable tablet Chew 1 tablet (81 mg total) by mouth daily. 07/23/14  Yes [name], MD  Cholecalciferol (VITAMIN D) 2000 UNITS tablet Take 1 tablet (2,000 Units total) by mouth daily. 02/13/14  Yes [name], MD  diltiazem (CARDIZEM CD) 120 MG 24 hr capsule Take 1 capsule (120 mg total) by mouth daily. 03/13/14  Yes Costin Karlyne Greenspan, MD  enoxaparin (LOVENOX) 150 MG/ML injection Inject 0.73 mLs (110 mg total) into the skin daily. Starting tonight 07/23/2014 at 10:00pm Patient taking differently: Inject 110 mg into the skin daily.  08/12/14  Yes Micheline Chapman, NP  folic acid (FOLVITE) 1 MG tablet Take 1 tablet (1 mg total) by mouth every morning. 10/08/13  Yes [name], MD  HYDROmorphone (DILAUDID) 4 MG tablet Take 1 tablet (4 mg total) by mouth every 4 (four) hours as needed for severe pain. 09/09/14  Yes Tresa Garter, MD    hydroxyurea (HYDREA) 500 MG capsule Take 3 capsules (1,500 mg total) by mouth daily. May take with food to minimize GI side effects. 09/09/14  Yes Tresa Garter, MD  lisinopril (PRINIVIL,ZESTRIL) 10 MG tablet Take 1 tablet (10 mg total) by mouth daily. 01/22/14  Yes [name], MD  metoprolol tartrate (LOPRESSOR) 25 MG tablet Take 1 tablet (25 mg total) by mouth daily. 08/22/13  Yes [name], MD  morphine (MS CONTIN) 30 MG 12 hr tablet Take 1 tablet (30 mg total) by mouth every 12 (twelve) hours. 09/09/14  Yes Tresa Garter, MD  mupirocin ointment (BACTROBAN) 2 % Apply 1 application topically 2 (two) times daily. Apply ointment to left great toe twice a day and apply dry dressing 09/01/14  Yes Newt Minion, MD  potassium chloride SA (K-DUR,KLOR-CON) 20 MEQ tablet Take 1 tablet (20 mEq total) by mouth every morning. 06/09/14  Yes [name], MD  zolpidem (AMBIEN) 10 MG tablet Take 1 tablet (10 mg total) by mouth at bedtime as needed for sleep. 08/08/14  Yes Micheline Chapman, NP    HOSPITAL MEDICATIONS: I have reviewed the patient's current medications.  VITALS: Blood pressure 118/82, pulse 96, temperature 98 F (36.7 C), temperature source Oral, resp. rate 21, height 6' (1.829 m), weight 140 lb (63.504 kg), SpO2 95 %.  PHYSICAL EXAM: General appearance: cooperative, cachectic and sedated but responds to questions Neck: no carotid bruit and no JVD Lungs: decreased breath sounds, poor effort Heart: regular rate and rhythm and 1-8/8 systolic murmur AOV and LSB, +S4 Abdomen: soft, non-tender; bowel sounds normal; no masses,  no organomegaly- no bruising noted Extremities: no edema Pulses: 2+ and symmetric Skin: Skin color, texture, turgor normal. No rashes or lesions Neurologic: Grossly normal  LABS: Results for orders placed or performed during the hospital encounter of 09/10/14 (from the past 24 hour(s))  Comprehensive metabolic panel     Status: Abnormal    Collection Time: 09/10/14  5:53 AM  Result Value Ref Range   Sodium 139 135 - 145 mmol/L   Potassium 3.2 (L) 3.5 - 5.1 mmol/L   Chloride 109 101 - 111 mmol/L   CO2 22 22 - 32 mmol/L   Glucose, Bld 115 (H) 65 - 99 mg/dL   BUN 14 6 - 20 mg/dL   Creatinine, Ser 0.81 0.61 - 1.24 mg/dL   Calcium 8.9 8.9 - 10.3 mg/dL   Total Protein 7.9 6.5 - 8.1 g/dL   Albumin 3.8 3.5 - 5.0 g/dL   AST 41 15 - 41 U/L   ALT 27 17 - 63 U/L   Alkaline  Phosphatase 67 38 - 126 U/L   Total Bilirubin 3.7 (H) 0.3 - 1.2 mg/dL   GFR calc non Af Amer >60 >60 mL/min   GFR calc Af Amer >60 >60 mL/min   Anion gap 8 5 - 15  CBC with Differential     Status: Abnormal   Collection Time: 09/10/14  5:53 AM  Result Value Ref Range   WBC 21.9 (H) 4.0 - 10.5 K/uL   RBC 2.30 (L) 4.22 - 5.81 MIL/uL   Hemoglobin 7.3 (L) 13.0 - 17.0 g/dL   HCT 21.4 (L) 39.0 - 52.0 %   MCV 93.0 78.0 - 100.0 fL   MCH 31.7 26.0 - 34.0 pg   MCHC 34.1 30.0 - 36.0 g/dL   RDW 21.9 (H) 11.5 - 15.5 %   Platelets 737 (H) 150 - 400 K/uL   Neutrophils Relative % 83 (H) 43 - 77 %   Lymphocytes Relative 5 (L) 12 - 46 %   Monocytes Relative 11 3 - 12 %   Eosinophils Relative 1 0 - 5 %   Basophils Relative 0 0 - 1 %   Neutro Abs 18.2 (H) 1.7 - 7.7 K/uL   Lymphs Abs 1.1 0.7 - 4.0 K/uL   Monocytes Absolute 2.4 (H) 0.1 - 1.0 K/uL   Eosinophils Absolute 0.2 0.0 - 0.7 K/uL   Basophils Absolute 0.0 0.0 - 0.1 K/uL   RBC Morphology POLYCHROMASIA PRESENT   Blood gas, arterial ([facility] & [facility] ONLY)     Status: Abnormal   Collection Time: 09/10/14  5:58 AM  Result Value Ref Range   O2 Content 3.0 L/min   Delivery systems NASAL CANNULA    pH, Arterial 7.427 7.350 - 7.450   pCO2 arterial 31.8 (L) 35.0 - 45.0 mmHg   pO2, Arterial 72.2 (L) 80.0 - 100.0 mmHg   Bicarbonate 20.5 20.0 - 24.0 mEq/L   TCO2 19.7 0 - 100 mmol/L   Acid-base deficit 2.9 (H) 0.0 - 2.0 mmol/L   O2 Saturation 91.4 %   Patient temperature 98.6    Collection site RIGHT BRACHIAL    Drawn by  [id number]    Sample type ARTERIAL DRAW   Troponin I     Status: Abnormal   Collection Time: 09/10/14  6:12 AM  Result Value Ref Range   Troponin I 3.48 (HH) <0.031 ng/mL  D-dimer, quantitative (not at [facility])     Status: Abnormal   Collection Time: 09/10/14  6:12 AM  Result Value Ref Range   D-Dimer, Quant 2.48 (H) 0.00 - 0.48 ug/mL-FEU  Brain natriuretic peptide     Status: Abnormal   Collection Time: 09/10/14  6:12 AM  Result Value Ref Range   B Natriuretic Peptide 549.5 (H) 0.0 - 100.0 pg/mL  I-Stat CG4 Lactic Acid, ED     Status: None   Collection Time: 09/10/14  6:45 AM  Result Value Ref Range   Lactic Acid, Venous 0.56 0.5 - 2.0 mmol/L  Troponin I (q 6hr x 3)     Status: Abnormal   Collection Time: 09/10/14  9:57 AM  Result Value Ref Range   Troponin I 6.34 (HH) <0.031 ng/mL  I-Stat CG4 Lactic Acid, ED     Status: Abnormal   Collection Time: 09/10/14 10:10 AM  Result Value Ref Range   Lactic Acid, Venous 0.31 (L) 0.5 - 2.0 mmol/L   *Note: Due to a large number of results and/or encounters for the requested time period, some results have not been displayed. A  complete set of results can be found in Results Review.    EKG: NSR, PVCs, inferior Qs, RVH, lateral Qs, septal TWI, IVCD  Echo: 08/25/14 Study Conclusions  - Left ventricle: The cavity size was normal. Wall thickness was increased in a pattern of mild LVH. Systolic function was normal. The estimated ejection fraction was in the range of 55% to 65%. Wall motion was normal; there were no regional wall motion abnormalities. Left ventricular diastolic function parameters were normal. - Aortic valve: There was trivial regurgitation. - Right ventricle: The cavity size was severely dilated. Systolic function was mildly reduced. - Right atrium: The atrium was severely dilated. - Tricuspid valve: There was moderate-severe regurgitation. - Pulmonary arteries: PA peak pressure: 59 mm Hg (S).    IMAGING: Dg  Chest 2 View  09/10/2014   CLINICAL DATA:  Acute onset of sickle cell pain crisis. Decreased O2 saturation. Worsening generalized weakness. Initial encounter.  EXAM: CHEST  2 VIEW  COMPARISON:  Chest radiograph from 08/29/2014  FINDINGS: The lungs are well-aerated. Mild vascular congestion is noted. Minimal bilateral atelectasis or scarring is noted. There is no evidence of pleural effusion or pneumothorax.  The heart is enlarged. A left-sided chest port is noted ending about the cavoatrial junction. No acute osseous abnormalities are seen. Clips are noted within the right upper quadrant, reflecting prior cholecystectomy.  IMPRESSION: Mild vascular congestion and cardiomegaly noted. Minimal bilateral atelectasis or scarring noted.   Electronically Signed   By: Garald Balding M.D.   On: 09/10/2014 06:00   Ct Angio Chest Pe W/cm &/or Wo Cm  09/10/2014   CLINICAL DATA:  Short of breath.  EXAM: CT ANGIOGRAPHY CHEST WITH CONTRAST  TECHNIQUE: Multidetector CT imaging of the chest was performed using the standard protocol during bolus administration of intravenous contrast. Multiplanar CT image reconstructions and MIPs were obtained to evaluate the vascular anatomy.  CONTRAST:  129mL OMNIPAQUE IOHEXOL 350 MG/ML SOLN  COMPARISON:  08/29/2014  FINDINGS: Mediastinum: There is moderate cardiac enlargement. No pericardial effusion. The trachea is patent and appears midline. Normal appearance of the esophagus. Prominent soft tissue is identified within the anterior mediastinum. Stress set prominent anterior mediastinal adenopathy is again identified. Index pre-vascular lymph node measures 1 cm, image 35/series 4. Previously 1.2 cm. Bilateral lower lobe pulmonary artery filling defects are again noted and are in the same distribution as the previous exam. No new pulmonary emboli identified.  Lungs/Pleura: There is no pleural effusion. Mosaic attenuation pattern is identified throughout both lungs compatible with pulmonary  emboli. No airspace consolidation. Scarring is noted predominantly involving the lower lobes.  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 Diffuse increased attenuation throughout the liver is noted. The visualized adrenal glands are unremarkable.  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  Review of the MIP images confirms the above findings.  IMPRESSION: 1. Again noted are chronic bilateral lower lobe pulmonary emboli. No new areas of pulmonary emboli identified. 2. Cardiac enlargement 3. Pulmonary and osseous stigmata of sickle cell disease.   Electronically Signed   By: Kerby Moors M.D.   On: 09/10/2014 09:00    IMPRESSION: Principal Problem:   Hb-SS disease with crisis  Active Problems:   Elevated troponin I level-The pt has had chronically elevated Troponin's going back to Dec 2015. His last two normal Troponin's were in Sept 2015.    Hx of pulmonary embolus- recurrent June 2016 secondary to non  compliance. He was placed on Lovenox then and says he only missed one dose but I saw no abdominal bruising on exam.     Pulmonary HTN- most recent echo July- PA 59 mmHg   Chronic anticoagulation- on Lovenox, failed Xarelto   Essential hypertension-controlled   Anemia of chronic disease-HGB 7.3   Functional asplenia   Chronic pain syndrome   Hypoxia- chronic home O2   Cor pulmonale   PVC's    Abnormal EKG   RECOMMENDATION: MD to see. He just had an echo 08/25/14. Not sure what benefit a Myoview would be, not sure he would be a candidate for cath/PCI.   Time Spent Directly with Patient: 45 minutes  Erlene Quan [phone] beeper 09/10/2014, 11:10 AM   Agree with note written by Kerin Ransom PAC  Pt has had mult admissions related to Sickle Cell Disease. H/O PE in past on Lovenox SQ most rercently. He has preserved LV fxn with severely dilated  and hypocontractile RV and PHTN. We are asked to see for CP and + Trop. He has had this in the past. EKG shows RBBB with RVH. His trop peaked at 7 but has been close to this level in the past. His CTO was neg for new PE. Exam notable for loud P2 c/w PHTN. Doubt he has obstructive epicardial disease. 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 I do not think he is a cath candidate and therefore prob not a MV candidate as well. Suggest continued medical Rx/ supportive care and pain management as you are doing. Will S/O. Call if we can be of further assistance.   Quay Burow 09/10/2014 12:59 PM

## 2014-09-10 NOTE — ED Notes (Signed)
PT CAN GO AT 10:40

## 2014-09-10 NOTE — Progress Notes (Signed)
Report called to ICU. Patient O2 sat's 99-100% on non-rebreather. Barbee Shro[place]. Brigitte Pulse, RN

## 2014-09-10 NOTE — Consult Note (Signed)
Name: [name] MRN: [id number] DOB: 06-04-1980    ADMISSION DATE:  09/10/2014 CONSULTATION DATE:  09/10/2014  REFERRING MD :  Zigmund Daniel  CHIEF COMPLAINT:  Rib pain, SOB  BRIEF PATIENT DESCRIPTION: 34 y.o. M with known sickle cell disease, admitted 8/10 with sickle cell crisis.  Had increasing O2 demands so transferred to SDU and PCCM consulted for further recs.   SIGNIFICANT EVENTS  8/10 - admitted with sickle cell crisis.  STUDIES:  CTA chest 8/10 >>> chronic bilateral lowe lobe PE.  No new PE's seen.  Cardiac enlargement, pulmonary and osseous stigmata of sickle cell disease. CXR 8/10 >>> chronic cardiomegaly and chronic vascular congestion.   HISTORY OF PRESENT ILLNESS:  [name] is a 34 y.o. M with PMH as outlined below including Hb SS.  He presented to [facility] ED 8/10 with bilateral rib pain typical of his sickle cell pain.  He normally takes MS contin daily; however, has not taken it recently as he had not been able to obtain a new prescription.  He was admitted by [facility] for pain management and further evaluation.  Workup revealed elevated troponin of 3.48 (later up to 7.76).  EKG was non-revealing.  Cardiology was consulted and they felt that this was due to microvascular disease vs true ACS (he also has hx of chronically elevated troponins.  D-dimer was elevated so CTA of the chest was obtained which revealed bilateral old PE's.  Pt is currently on lovenox for his known PE's.  PAST MEDICAL HISTORY :   has a past medical history of Sickle cell anemia; Blood transfusion; Acute embolism and thrombosis of right internal jugular vein; Hypokalemia; Mood disorder; History of pulmonary embolus (PE); Avascular necrosis; Leukocytosis; Thrombocytosis; Hypertension; History of Clostridium difficile infection; Uses marijuana; Chronic anticoagulation; Functional asplenia; Former smoker; Second hand tobacco smoke exposure; Alcohol consumption of one to four drinks per day; Noncompliance with  medication regimen; Sickle-cell crisis with associated acute chest syndrome (05/13/2013); Acute chest syndrome (06/18/2013); and Demand ischemia (01/02/2014).  has past surgical history that includes Right hip replacement; Cholecystectomy; Porta cath placement; Porta cath removal; Umbilical hernia repair; Excision of left periauricular cyst; Excision of right ear lobe cyst with primary closur; and Portacath placement (01/05/2012). Prior to Admission medications   Medication Sig Start Date End Date Taking? Authorizing Provider  aspirin 81 MG chewable tablet Chew 1 tablet (81 mg total) by mouth daily. 07/23/14  Yes [name], MD  Cholecalciferol (VITAMIN D) 2000 UNITS tablet Take 1 tablet (2,000 Units total) by mouth daily. 02/13/14  Yes [name], MD  diltiazem (CARDIZEM CD) 120 MG 24 hr capsule Take 1 capsule (120 mg total) by mouth daily. 03/13/14  Yes Costin Karlyne Greenspan, MD  enoxaparin (LOVENOX) 150 MG/ML injection Inject 0.73 mLs (110 mg total) into the skin daily. Starting tonight 07/23/2014 at 10:00pm Patient taking differently: Inject 110 mg into the skin daily.  08/12/14  Yes Micheline Chapman, NP  folic acid (FOLVITE) 1 MG tablet Take 1 tablet (1 mg total) by mouth every morning. 10/08/13  Yes [name], MD  HYDROmorphone (DILAUDID) 4 MG tablet Take 1 tablet (4 mg total) by mouth every 4 (four) hours as needed for severe pain. 09/09/14  Yes Tresa Garter, MD  hydroxyurea (HYDREA) 500 MG capsule Take 3 capsules (1,500 mg total) by mouth daily. May take with food to minimize GI side effects. 09/09/14  Yes Tresa Garter, MD  lisinopril (PRINIVIL,ZESTRIL) 10 MG tablet Take 1 tablet (  10 mg total) by mouth daily. 01/22/14  Yes [name], MD  metoprolol tartrate (LOPRESSOR) 25 MG tablet Take 1 tablet (25 mg total) by mouth daily. 08/22/13  Yes [name], MD  morphine (MS CONTIN) 30 MG 12 hr tablet Take 1 tablet (30 mg total) by mouth every 12 (twelve) hours.  09/09/14  Yes Tresa Garter, MD  mupirocin ointment (BACTROBAN) 2 % Apply 1 application topically 2 (two) times daily. Apply ointment to left great toe twice a day and apply dry dressing 09/01/14  Yes Newt Minion, MD  potassium chloride SA (K-DUR,KLOR-CON) 20 MEQ tablet Take 1 tablet (20 mEq total) by mouth every morning. 06/09/14  Yes [name], MD  zolpidem (AMBIEN) 10 MG tablet Take 1 tablet (10 mg total) by mouth at bedtime as needed for sleep. 08/08/14  Yes Micheline Chapman, NP   No Known Allergies  FAMILY HISTORY:  family history includes Depression in his mother; Diabetes in his mother; Sickle cell trait in his brother, father, and mother. SOCIAL HISTORY:  reports that he quit smoking about 4 years ago. He has never used smokeless tobacco. He reports that he uses illicit drugs (Marijuana) about twice per week. He reports that he does not drink alcohol.  REVIEW OF SYSTEMS:   All negative; except for those that are bolded, which indicate positives.  Constitutional: weight loss, weight gain, night sweats, fevers, chills, fatigue, weakness.  HEENT: headaches, sore throat, sneezing, nasal congestion, post nasal drip, difficulty swallowing, tooth/dental problems, visual complaints, visual changes, ear aches. Neuro: difficulty with speech, weakness, numbness, ataxia. CV:  chest pain, orthopnea, PND, swelling in lower extremities, dizziness, palpitations, syncope.  Resp: cough, hemoptysis, dyspnea, wheezing. GI  heartburn, indigestion, abdominal pain, nausea, vomiting, diarrhea, constipation, change in bowel habits, loss of appetite, hematemesis, melena, hematochezia.  GU: dysuria, change in color of urine, urgency or frequency, flank pain, hematuria. MSK: Bilateral rib pain, joint pain or swelling, decreased range of motion. Psych: change in mood or affect, depression, anxiety, suicidal ideations, homicidal ideations. Skin: rash, itching, bruising.   SUBJECTIVE:  Denies cough,  chest pain.  Has mild dyspnea but has had increasing O2 requirements, now on NRB.  VITAL SIGNS: Temp:  [98 F (36.7 C)-98.4 F (36.9 C)] 98.3 F (36.8 C) (08/10 1540) Pulse Rate:  [95-108] 98 (08/10 1653) Resp:  [18-24] 22 (08/10 1717) BP: (109-124)/(70-92) 110/70 mmHg (08/10 1653) SpO2:  [74 %-96 %] 94 % (08/10 1717) FiO2 (%):  [3 %] 3 % (08/10 0503) Weight:  [63.504 kg (140 lb)-67.5 kg (148 lb 13 oz)] 67.5 kg (148 lb 13 oz) (08/10 1145)  PHYSICAL EXAMINATION: General: Young AA male, resting in bed, in NAD. Neuro: A&O x 3, non-focal.  HEENT: [place]/AT. PERRL, sclerae anicteric. Cardiovascular: RRR, no M/R/G.  Lungs: Respirations shallow and unlabored.  CTA bilaterally, No W/R/R. Abdomen: BS x 4, soft, NT/ND.  Musculoskeletal: No gross deformities, no edema.  Skin: Intact, warm, no rashes. Multiple tattoo's.    Recent Labs Lab 09/10/14 0553  NA 139  K 3.2*  CL 109  CO2 22  BUN 14  CREATININE 0.81  GLUCOSE 115*    Recent Labs Lab 09/09/14 1125 09/10/14 0553  HGB 7.9* 7.3*  HCT 23.6* 21.4*  WBC 22.8* 21.9*  PLT 753* 737*   Dg Chest 1 View  09/10/2014   CLINICAL DATA:  Respiratory failure. Hypoxemia. Sickle cell patient.  EXAM: CHEST  1 VIEW  COMPARISON:  Radiographs and CT earlier today.  FINDINGS: 1813 hour. Stable cardiomegaly. The left subclavian Port-A-Cath is unchanged at the SVC right atrial junction. Stable chronic vascular congestion and basilar scarring. No superimposed edema, confluent airspace opacity or significant pleural effusion. 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  IMPRESSION: Stable examination compared with earlier study today. Chronic cardiomegaly and chronic vascular congestion.   Electronically Signed   By: Richardean Sale M.D.   On: 09/10/2014 18:27   Dg Chest 2 View  09/10/2014   CLINICAL DATA:  Acute onset of sickle cell pain crisis. Decreased O2 saturation. Worsening generalized weakness. Initial encounter.  EXAM: CHEST  2  VIEW  COMPARISON:  Chest radiograph from 08/29/2014  FINDINGS: The lungs are well-aerated. Mild vascular congestion is noted. Minimal bilateral atelectasis or scarring is noted. There is no evidence of pleural effusion or pneumothorax.  The heart is enlarged. A left-sided chest port is noted ending about the cavoatrial junction. No acute osseous abnormalities are seen. Clips are noted within the right upper quadrant, reflecting prior cholecystectomy.  IMPRESSION: Mild vascular congestion and cardiomegaly noted. Minimal bilateral atelectasis or scarring noted.   Electronically Signed   By: Garald Balding M.D.   On: 09/10/2014 06:00   Ct Angio Chest Pe W/cm &/or Wo Cm  09/10/2014   CLINICAL DATA:  Short of breath.  EXAM: CT ANGIOGRAPHY CHEST WITH CONTRAST  TECHNIQUE: Multidetector CT imaging of the chest was performed using the standard protocol during bolus administration of intravenous contrast. Multiplanar CT image reconstructions and MIPs were obtained to evaluate the vascular anatomy.  CONTRAST:  138mL OMNIPAQUE IOHEXOL 350 MG/ML SOLN  COMPARISON:  08/29/2014  FINDINGS: Mediastinum: There is moderate cardiac enlargement. No pericardial effusion. The trachea is patent and appears midline. Normal appearance of the esophagus. Prominent soft tissue is identified within the anterior mediastinum. Stress set prominent anterior mediastinal adenopathy is again identified. Index pre-vascular lymph node measures 1 cm, image 35/series 4. Previously 1.2 cm. Bilateral lower lobe pulmonary artery filling defects are again noted and are in the same distribution as the previous exam. No new pulmonary emboli identified.  Lungs/Pleura: There is no pleural effusion. Mosaic attenuation pattern is identified throughout both lungs compatible with pulmonary emboli. No airspace consolidation. Scarring is noted predominantly involving the lower lobes.  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  Diffuse increased attenuation throughout the liver is noted. The visualized adrenal glands are unremarkable.  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  Review of the MIP images confirms the above findings.  IMPRESSION: 1. Again noted are chronic bilateral lower lobe pulmonary emboli. No new areas of pulmonary emboli identified. 2. Cardiac enlargement 3. Pulmonary and osseous stigmata of sickle cell disease.   Electronically Signed   By: Kerby Moors M.D.   On: 09/10/2014 09:00    ASSESSMENT / PLAN:  Acute hypoxemic respiratory failure. Possible HCAP - no obvious new infiltrate / density to suggest acute chest. Old PE's. Plan: Continue supplemental O2 as needed to maintain SpO2 > 92%. BiPAP PRN. No immediate need for intubation. Empiric abx. Sputum cultures, blood cultures. NPO in case he declines. Continue lovenox. Pulmonary hygiene. Incentive spirometry. CXR in AM.  Elevated troponin - suspect microvascular disease secondary to SCD (has hx of chronically elevated trop's). Plan: Cards following. Trend troponins. Continue ASA, Metoprolol, Lovenox.  Sickle Cell Crisis. Plan: Continue pain control - caution with being over aggressive given his hypoxemic respiratory failure (avoid depressing  his respiratory drive further). Continue hydroxyurea.  Rest per primary.   Montey Hora, [facility] Pager: [phone]  or [phone] 09/10/2014, 7:53 PM  Attending Note:  34 year old male with sickle cell disease and severe PAH who presents in sickle cell crises.  Worsened dramatically over the course of the day.  CXR now with pulmonary edema and infiltrate noted.  O2 demand has gone from 2L [place] to 100% NRB with increased work of breathing.  WBC is also elevated.  I am very concerned that this is either acute chest syndrome that is rapidly  progressive versus HCAP.  BP is less concerning as that is related to narcotic use.  Chest exam with bibasilar crackles.  I reviewed CXR myself and findings as above.  Will transfer to the ICU.  Start broad spectrum abx.  Careful with diureses given right heart failure.  Transfuse per protocol and monitor closely.  If worsens then will likely need to be intubated.  Case discussed with TRH-MD and PCCM-NP.  This note is for services rendered on 8/10.  The patient is critically ill with multiple organ systems failure and requires high complexity decision making for assessment and support, frequent evaluation and titration of therapies, application of advanced monitoring technologies and extensive interpretation of multiple databases.   Critical Care Time devoted to patient care services described in this note is  35  Minutes. This time reflects time of care of this signee Dr Jennet Maduro. This critical care time does not reflect procedure time, or teaching time or supervisory time of PA/NP/Med student/Med Resident etc but could involve care discussion time.  Rush Farmer, M.D. [facility]. Pager: [phone]. After hours pager: [phone].

## 2014-09-10 NOTE — ED Provider Notes (Addendum)
8:34 AM  Took sign out about patient at 8 AM. At that time we are waiting for hospitalist to call back to admit the patient. Initially thought to be patient's usual sickle cell crisis. However in the meantime patient's troponin came back at 3.44 patient's d-dimer is elevated at 2.64. Patient has history of pulmonary embolism. I reinterviewed the patient he's been off his Lovenox last 2 days. He is satting only 93% on 4 L. Very concerned about a pulmonary embolism. We called over to CAT scan and put him in the front of the list.  Consider acute chest as well. However given there is no infiltrate on chest x-ray, no fever, does not meet criteria at this time.  If there is a PE and he has such significant troponin, 02 requirement,  I'm concerned about the amount of right heart strain and will consult ICU.  9:21 AM Heparin started. ICU says they are chronic, recommend step down. Hospitlist called and will evaluate.   CRITICAL CARE Performed by: Gardiner Sleeper   Total critical care time: 30  Critical care time was exclusive of separately billable procedures and treating other patients.  Critical care was necessary to treat or prevent imminent or life-threatening deterioration.  Critical care was time spent personally by me on the following activities: development of treatment plan with patient and/or surrogate as well as nursing, discussions with consultants, evaluation of patient's response to treatment, examination of patient, obtaining history from patient or surrogate, ordering and performing treatments and interventions, ordering and review of laboratory studies, ordering and review of radiographic studies, pulse oximetry and re-evaluation of patient's condition.  Nyssa Sayegh Julio Alm, MD 09/10/14 [place], MD 09/10/14 [phone]

## 2014-09-10 NOTE — Progress Notes (Signed)
Patient ID: [name], male   DOB: 1980-07-04, 34 y.o.   MRN: [id number] Called by nurse for critical value Troponin 7.76. Came to bedside to see patient and he denies pre-cordial pain. Pain continues to be localized to B/L ribs. He is alert and oriented x 3. Appears fatigues and chronically ill but not acutely ill.   Tele Review: HR 80-103 with occasional PVC's.   Cardiology Consult Reviewed: Not a candidate for cath and he does not think that this is obstructive epicardial disease but rather microvascular related to SCD.  Recommend continue ASA, Lovenox and medical management.  Patient Vitals for the past 24 hrs:  BP Temp Temp src Pulse Resp SpO2  09/10/14 1717 - - - - (!) 22 94 %  09/10/14 1653 110/70 mmHg - - 98 - -  09/10/14 1540 112/84 mmHg 98.3 F (36.8 C) Oral 99 (!) 22 92 %  09/10/14 1145 123/88 mmHg 98.4 F (36.9 C) Oral 96 (!) 22 95 %   Focused examination: General: Alert, awake, oriented x3, in mild distress. A&O x 3. Neck: Trachea midline, no masses, no thyromegal,y no JVD, no carotid bruit OROPHARYNX: Moist, No exudate/ erythema/lesions.  Heart: Regular rate and rhythm, II/VI SEM unchanged, P2 present.  Pulmonary/Chest: Normal effort. Breath sounds normal. No. Apnea. Clear to auscultation,no stridor,  no wheezing and no rhonchi noted. No respiratory distress and no tenderness noted. Neuro: Alert and oriented to person, place and time. Normal motor skills. No focal neurological deficits noted cranial nerves II through XII grossly intact. Strength at baseline in bilateral upper and lower extremities. Gait normal. Musculoskeletal:No C/C/E. Psychiatric: Patient alert and oriented x3, good insight and cognition, good recent to remote recall.   A/P: Pt with further elevated Troponin. He also has hypoxemia and is requiring increased oxygen use. He had X-ray and CTA today which was negative for new infiltrates or PE.  - Check 12 Lead EKG -Will contiue Oxygen  supplementation. Transfuse 1 unit RBC's in light of worsening Hypoxemia. - also check ABG with worsening Hypoxemia - Increase Metoprolol to BID to decrease Myocardial Oxygen consumption.  - Will not increase ASA as I fell that in light of Cardiology recommendations and in setting of therapeutic Lovenox, this would increase his risk of bleeding without much benefit.  - Continue telemetry monitoring on Tele unit as pt hemodynamically stable. Consider moving to SDU if blood gas reveals hypoxemia not corrected with supplemental oxygen.  Latesha Chesney A.  Time spent 30 minutes.

## 2014-09-10 NOTE — Progress Notes (Signed)
Follow-up Progress Note  Patient is a 34 year old gentleman with a past medical history of sickle cell disease, becoming increasingly hypoxemic this afternoon. His oxygen requirement has increased to 6 L nasal cannula having oxygen saturations of 88-90%. He was worked up with a CT scan of lungs with IV contrast that did not reveal new areas of pulmonary emboli. Chronic bilateral PEs were noted. Chest x-ray performed this morning showed mild vascular congestion and cardiomegaly. A.m. labs revealed a hemoglobin of 7.3.  Physical exam Gen: Patient appears mildly dyspneic, receiving supplemental oxygen at 6 L nasal cannula. I did not observe him utilizing the respiratory accessory muscles. Cardiovascular: Tachycardic, regular rate and rhythm normal S1-S2 Lungs: For the most part he had clear lungs on exam, good air movement, I did not appreciate wheezing rhonchi crackles or rales. Abdomen: Soft nontender nondistended   Plan  Acute hypoxemic respiratory failure -Evidenced by an increase in his oxygen requirement to 6 L nasal cannula maintaining oxygen saturations of 88-90%. -Patient will need to be transferred to the stepdown unit for close monitoring -Will obtain a stat ABG, chest x-ray, CBC -1 unit of packed red blood cells have been ordered. He was just typed and crossed.

## 2014-09-10 NOTE — ED Notes (Signed)
Pt is c/o bilateral side pain   Pt states the pain has been off and on for the past 2 weeks  Pt's oxygen saturation was 74 on room air  Oxygen applied via [place] at 3 l/min via [place]  Skin pale, warm, and dry  Pt has a portacath noted to left chest

## 2014-09-10 NOTE — Progress Notes (Signed)
ANTIBIOTIC CONSULT NOTE - INITIAL  Pharmacy Consult for Vancomycin and Zosyn Indication: Suspected HCAP  No Known Allergies  Patient Measurements: Height: 6' (182.9 cm) Weight: 148 lb 13 oz (67.5 kg) IBW/kg (Calculated) : 77.6  Vital Signs: Temp: 98.3 F (36.8 C) (08/10 1540) Temp Source: Oral (08/10 1540) BP: 110/70 mmHg (08/10 1653) Pulse Rate: 98 (08/10 1653) Intake/Output from previous day:   Intake/Output from this shift:    Labs:  Recent Labs  09/09/14 1125 09/10/14 0553  WBC 22.8* 21.9*  HGB 7.9* 7.3*  PLT 753* 737*  CREATININE  --  0.81   Estimated Creatinine Clearance: 121.5 mL/min (by C-G formula based on Cr of 0.81). No results for input(s): VANCOTROUGH, VANCOPEAK, VANCORANDOM, GENTTROUGH, GENTPEAK, GENTRANDOM, TOBRATROUGH, TOBRAPEAK, TOBRARND, AMIKACINPEAK, AMIKACINTROU, AMIKACIN in the last 72 hours.   Microbiology: Recent Results (from the past 720 hour(s))  MRSA PCR Screening     Status: None   Collection Time: 08/29/14 11:35 AM  Result Value Ref Range Status   MRSA by PCR NEGATIVE NEGATIVE Final    Comment:        The GeneXpert MRSA Assay (FDA approved for NASAL specimens only), is one component of a comprehensive MRSA colonization surveillance program. It is not intended to diagnose MRSA infection nor to guide or monitor treatment for MRSA infections.   Culture, blood (routine x 2)     Status: None   Collection Time: 08/29/14  9:30 PM  Result Value Ref Range Status   Specimen Description BLOOD RIGHT WRIST  Final   Special Requests IN PEDIATRIC BOTTLE 3ML  Final   Culture  Setup Time   Final    GRAM POSITIVE COCCI IN CLUSTERS GRAM NEGATIVE COCCOBACILLI AEROBIC BOTTLE ONLY CRITICAL RESULT CALLED TO, READ BACK BY AND VERIFIED WITH: E PELLITIER,RN AT 7035 09/01/14 BY L BENFIELD    Culture   Final    ROSEOMONAS SPECIES Standardized susceptibility testing for this organism is not available. STAPHYLOCOCCUS SPECIES (COAGULASE NEGATIVE) THE  SIGNIFICANCE OF ISOLATING THIS ORGANISM FROM A SINGLE SET OF BLOOD CULTURES WHEN MULTIPLE SETS ARE DRAWN IS UNCERTAIN. PLEASE NOTIFY THE MICROBIOLOGY DEPARTMENT WITHIN ONE WEEK IF SPECIATION AND SENSITIVITIES ARE REQUIRED. Performed at [facility]    Report Status 09/05/2014 FINAL  Final  Culture, blood (routine x 2)     Status: None   Collection Time: 08/29/14  9:39 PM  Result Value Ref Range Status   Specimen Description BLOOD BLOOD RIGHT HAND  Final   Special Requests IN PEDIATRIC BOTTLE 3ML  Final   Culture   Final    NO GROWTH 5 DAYS Performed at [facility]    Report Status 09/04/2014 FINAL  Final  Culture, blood (routine x 2)     Status: None   Collection Time: 09/01/14  1:40 PM  Result Value Ref Range Status   Specimen Description BLOOD RIGHT HAND  Final   Special Requests IN PEDIATRIC BOTTLE 4CC  Final   Culture   Final    NO GROWTH 5 DAYS Performed at [facility]    Report Status 09/06/2014 FINAL  Final  Culture, blood (routine x 2)     Status: None   Collection Time: 09/01/14  1:50 PM  Result Value Ref Range Status   Specimen Description BLOOD RIGHT ARM  Final   Special Requests IN PEDIATRIC BOTTLE 4CC  Final   Culture   Final    NO GROWTH 5 DAYS Performed at [facility]    Report  Status 09/06/2014 FINAL  Final    Medical History: Past Medical History  Diagnosis Date  . Sickle cell anemia   . Blood transfusion   . Acute embolism and thrombosis of right internal jugular vein   . Hypokalemia   . Mood disorder   . History of pulmonary embolus (PE)   . Avascular necrosis   . Leukocytosis     Chronic  . Thrombocytosis     Chronic  . Hypertension   . History of Clostridium difficile infection   . Uses marijuana   . Chronic anticoagulation   . Functional asplenia   . Former smoker   . Second hand tobacco smoke exposure   . Alcohol consumption of one to four drinks per day   . Noncompliance with medication regimen   .  Sickle-cell crisis with associated acute chest syndrome 05/13/2013  . Acute chest syndrome 06/18/2013  . Demand ischemia 01/02/2014    Medications:  Anti-infectives    None     Assessment: 34yo M admitted w/ sickle cell pain crisis, developing dyspnea and hypoxia. Pharmacy is asked to start broad-spectrum antibiotics for possible HCAP. SCr is wnl.  Goal of Therapy:  Vancomycin trough level 15-20 mcg/ml  Appropriate antibiotic dosing for renal function; eradication of infection  Plan:  Zosyn 3.375g IV Q8H infused over 4hrs. Vancomycin 1g IV q8h. Measure Vanc trough at steady state. Follow up renal fxn, culture results, and clinical course.  Romeo Rabon, PharmD, pager [phone]. 09/10/2014,8:08 PM.

## 2014-09-10 NOTE — H&P (Signed)
Hospital Admission Note Date: 09/10/2014  Patient name: [name] Medical record number: [id number] Date of birth: 07/26/1980 Age: 34 y.o. Gender: male PCP: [name], MD  Attending physician: [name], MD  Chief Complaint: Pain in B/L ribs x 12 hours.  History of Present Illness: Opiate tolerant patient with Hb SS who presented to the ED with pain in b/l ribs typical of sicke cell pain. Pt describes the pain as sharp and throbbing in nature and non-radiating. At it's greatest intensity. Pain was 10/10 and patient took oral Dilaudid without much relief. Thus he came to the ED. Of note, patient is supposed to be on MS Contin on a daily basis but has been unable to obtain prescription so has not had MS Contin since last week. He denies dizziness, Pre-cordial chest wall pain, pain with deep breathing, SOB, DOE, F/C, N/V/D. He also has known PAH and is chronically hypoxic with an Oxygen requirement of 3 L/min during activity and at night. However he has not been compliant wit the use of Oxygen.  Incidental findings during the course of his ED evaluation was an elevated Troponin of 3.48 without any significant EKG changes. He was also found to have an elevated BNP of 549.5 which is below his BNP  Level of 1117 12 days go and chronically elevated pro-BNP as high as 5000 in the absence of N-STEMI. He was previously evaluated by Cardiology (07/23/2014) who recommended  [facility] as an out-patient but patient has not yet been referred. He was also found to have an elevated D-dimer and a CT angiogram demonstrated chronic bi-lateral PE but no new PE. Please note that patient missed one dose of Lovenox yesterday but states that he has been compliant with it's use otherwise. Scheduled Meds: . enoxaparin (LOVENOX) injection  110 mg Subcutaneous Q24H   Continuous Infusions:  PRN Meds:. Allergies: Review of patient's allergies indicates no known allergies. Past Medical History   Diagnosis Date  . Sickle cell anemia   . Blood transfusion   . Acute embolism and thrombosis of right internal jugular vein   . Hypokalemia   . Mood disorder   . History of pulmonary embolus (PE)   . Avascular necrosis   . Leukocytosis     Chronic  . Thrombocytosis     Chronic  . Hypertension   . History of Clostridium difficile infection   . Uses marijuana   . Chronic anticoagulation   . Functional asplenia   . Former smoker   . Second hand tobacco smoke exposure   . Alcohol consumption of one to four drinks per day   . Noncompliance with medication regimen   . Sickle-cell crisis with associated acute chest syndrome 05/13/2013  . Acute chest syndrome 06/18/2013  . Demand ischemia 01/02/2014   Past Surgical History  Procedure Laterality Date  . Right hip replacement      08/2006  . Cholecystectomy      01/2008  . Porta cath placement    . Porta cath removal    . Umbilical hernia repair      01/2008  . Excision of left periauricular cyst      10/2009  . Excision of right ear lobe cyst with primary closur      11/2007  . Portacath placement  01/05/2012    Procedure: INSERTION PORT-A-CATH;  Surgeon: Odis Hollingshead, MD;  Location: [facility];  Service: General;  Laterality: N/A;  ultrasound guiced port a cath insertion with fluoroscopy  Family History  Problem Relation Age of Onset  . Sickle cell trait Mother   . Depression Mother   . Diabetes Mother   . Sickle cell trait Father   . Sickle cell trait Brother    Social History   Social History  . Marital Status: Single    Spouse Name: N/A  . Number of Children: 0  . Years of Education: 13   Occupational History  . 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 Smoking status: Former Smoker -- 13 years    Quit date: 07/08/2010  . Smokeless tobacco: Never Used  . Alcohol Use: No  . Drug Use: 2.00 per week    Special: Marijuana  . Sexual Activity:    Partners:  Female    Museum/gallery curator: None     Comment: month ago   Other Topics Concern  . Not on file   Social History Narrative   Lives in an apartment.  Single.  Lives alone but has a girlfriend that helps care for him.  Does not use any assist devices.        [name]:  [phone] Mom, emergency contact   Review of Systems: A comprehensive review of systems was negative except as noted in HPI. Physical Exam: No intake or output data in the 24 hours ending 09/10/14 1006 General: Alert, awake, oriented x3, in no acute distress. Drowsy after receiving IV Dilaudid and Benadryl, but arousable and able to converse appropriately HEENT: [place]/AT PEERL, EOMI, anicteric. Muddy sclera Neck: Trachea midline,  no masses, no thyromegal,y no JVD, no carotid bruit OROPHARYNX:  Moist, No exudate/ erythema/lesions. Geographic tongue Heart: Regular rate and rhythm, without murmurs, rubs,  Split S2, ? S4 gallop, PMI non-displaced, no heaves or thrills on palpation.  Lungs: Clear to auscultation, no wheezing or rhonchi noted. No increased vocal fremitus resonant to percussion  Abdomen: Soft, nontender, nondistended, positive bowel sounds, no masses no hepatosplenomegaly noted..  Neuro: No focal neurological deficits noted cranial nerves II through XII grossly intact. Patient moving all extremities against gravity. + facial symmetry. Musculoskeletal: No warmth swelling or erythema around joints, no spinal tenderness noted. Psychiatric: Patient alert and oriented x3, good insight and cognition, good recent to remote recall. Lymph node survey: No cervical axillary or inguinal lymphadenopathy noted.  Lab results:  Recent Labs  09/10/14 0553  NA 139  K 3.2*  CL 109  CO2 22  GLUCOSE 115*  BUN 14  CREATININE 0.81  CALCIUM 8.9    Recent Labs  09/10/14 0553  AST 41  ALT 27  ALKPHOS 67  BILITOT 3.7*  PROT 7.9  ALBUMIN 3.8   No results for input(s): LIPASE, AMYLASE in the last 72  hours.  Recent Labs  09/09/14 1125 09/10/14 0553  WBC 22.8* 21.9*  NEUTROABS 18.3* 18.2*  HGB 7.9* 7.3*  HCT 23.6* 21.4*  MCV 93.3 93.0  PLT 753* 737*    Recent Labs  09/10/14 0612  TROPONINI 3.48*   Invalid input(s): POCBNP  Recent Labs  09/10/14 0612  DDIMER 2.48*   No results for input(s): HGBA1C in the last 72 hours. No results for input(s): CHOL, HDL, LDLCALC, TRIG, CHOLHDL, LDLDIRECT in the last 72 hours. No results for input(s): TSH, T4TOTAL, T3FREE, THYROIDAB in the last 72 hours.  Invalid input(s): FREET3  Recent Labs  09/09/14 1125  RETICCTPCT 17.9*   Imaging results:  Dg Chest 2 View  09/10/2014   CLINICAL DATA:  Acute onset of sickle cell pain crisis. Decreased O2 saturation. Worsening generalized weakness. Initial encounter.  EXAM: CHEST  2 VIEW  COMPARISON:  Chest radiograph from 08/29/2014  FINDINGS: The lungs are well-aerated. Mild vascular congestion is noted. Minimal bilateral atelectasis or scarring is noted. There is no evidence of pleural effusion or pneumothorax.  The heart is enlarged. A left-sided chest port is noted ending about the cavoatrial junction. No acute osseous abnormalities are seen. Clips are noted within the right upper quadrant, reflecting prior cholecystectomy.  IMPRESSION: Mild vascular congestion and cardiomegaly noted. Minimal bilateral atelectasis or scarring noted.   Electronically Signed   By: Garald Balding M.D.   On: 09/10/2014 06:00   Dg Chest 2 View  08/29/2014   CLINICAL DATA:  Sickle cell crisis. Chest pain. Severe bilateral lower extremity pain.  EXAM: CHEST  2 VIEW  COMPARISON:  08/24/2014.  FINDINGS: No change. Artifact overlies the chest. Cardiac silhouette remains enlarged. Mediastinal shadows are otherwise normal. Power port inserted from the left subclavian vein has its tip in the SVC just above the right atrium. Vascular plethora remains present without edema or effusions. Pulmonary scarring remains present.   IMPRESSION: No acute finding. Cardiomegaly. Pulmonary vascular plethora. Pulmonary scarring.   Electronically Signed   By: Nelson Chimes M.D.   On: 08/29/2014 08:49   Dg Chest 2 View  08/24/2014   CLINICAL DATA:  Sickle cell crisis, shortness of breath  EXAM: CHEST  2 VIEW  COMPARISON:  08/05/2014  FINDINGS: Moderate enlargement of the cardiac silhouette is reidentified. Diffusely prominent interstitial markings are stable with areas of linear probable superimposed scarring. No pleural effusion. Cholecystectomy clips are noted. No acute osseous finding. Left-sided Port-A-Cath in place with tip at the cavoatrial junction.  IMPRESSION: No focal acute finding.   Electronically Signed   By: Conchita Paris M.D.   On: 08/24/2014 08:41   Dg Ankle Complete Left  08/24/2014   CLINICAL DATA:  Sickle cell crisis with left lower leg and ankle pain and swelling.  EXAM: LEFT ANKLE COMPLETE - 3+ VIEW  COMPARISON:  12/21/2006  FINDINGS: Diffuse soft tissue swelling is seen surrounding the left ankle. No evidence of fracture, dislocation or bony destruction. The ankle mortise shows normal alignment. No bony lesions or visible bony infarcts by x-ray. No significant degenerative changes.  IMPRESSION: Soft tissue swelling surrounding left ankle. No underlying bony abnormalities are identified.   Electronically Signed   By: Aletta Edouard M.D.   On: 08/24/2014 08:35   Ct Angio Chest Pe W/cm &/or Wo Cm  09/10/2014   CLINICAL DATA:  Short of breath.  EXAM: CT ANGIOGRAPHY CHEST WITH CONTRAST  TECHNIQUE: Multidetector CT imaging of the chest was performed using the standard protocol during bolus administration of intravenous contrast. Multiplanar CT image reconstructions and MIPs were obtained to evaluate the vascular anatomy.  CONTRAST:  164mL OMNIPAQUE IOHEXOL 350 MG/ML SOLN  COMPARISON:  08/29/2014  FINDINGS: Mediastinum: There is moderate cardiac enlargement. No pericardial effusion. The trachea is patent and appears midline.  Normal appearance of the esophagus. Prominent soft tissue is identified within the anterior mediastinum. Stress set prominent anterior mediastinal adenopathy is again identified. Index pre-vascular lymph node measures 1 cm, image 35/series 4. Previously 1.2 cm. Bilateral lower lobe pulmonary artery filling defects are again noted and are in the same distribution as the previous exam. No new pulmonary emboli identified.  Lungs/Pleura: There is no pleural effusion. Mosaic attenuation pattern is identified throughout  both lungs compatible with pulmonary emboli. No airspace consolidation. Scarring is noted predominantly involving the lower lobes.  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 Diffuse increased attenuation throughout the liver is noted. The visualized adrenal glands are unremarkable.  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  Review of the MIP images confirms the above findings.  IMPRESSION: 1. Again noted are chronic bilateral lower lobe pulmonary emboli. No new areas of pulmonary emboli identified. 2. Cardiac enlargement 3. Pulmonary and osseous stigmata of sickle cell disease.   Electronically Signed   By: Kerby Moors M.D.   On: 09/10/2014 09:00   Ct Angio Chest Pe W/cm &/or Wo Cm  08/29/2014   CLINICAL DATA:  Chest pain.  Sickle cell.  Recent pulmonary embolism  EXAM: CT ANGIOGRAPHY CHEST WITH CONTRAST  TECHNIQUE: Multidetector CT imaging of the chest was performed using the standard protocol during bolus administration of intravenous contrast. Multiplanar CT image reconstructions and MIPs were obtained to evaluate the vascular anatomy.  CONTRAST:  176mL OMNIPAQUE IOHEXOL 350 MG/ML SOLN  COMPARISON:  CT chest 07/20/2014  FINDINGS: Small filling defects in the lower lobe pulmonary arteries are unchanged from the prior study and consistent with residual  pulmonary emboli which are not dissolved. No new emboli identified. It is unlikely these are recurrent emboli as the are in the exact same location as the prior study.  Cardiac enlargement without pericardial effusion. Right ventricular enlargement. RV/LV ratio is 1.5 suggesting right heart strain. Pulmonary arteries are enlarged.  Port-A-Cath in the SVC unchanged.  Because  Anterior mediastinal adenopathy measuring up to 12 mm unchanged. No hilar adenopathy.  Patchy airspace densities in the posterior lung bases bilaterally unchanged most consistent with atelectasis or scarring. Additional ground-glass density in the lungs bilaterally most notably in the right lung are unchanged and may be related to chronic lung disease or pulmonary emboli.  Upper abdomen negative.  Negative for   effusion.  Review of the MIP images confirms the above findings.  IMPRESSION: Small filling defects in the lower lobe pulmonary arteries unchanged from the CT of 07/20/2014 compatible with residual emboli which have not dissolved. No new emboli identified.  Cardiac enlargement. Pulmonary artery enlargement. Enlarged right ventricle. RV/LV ratio 1.5 consistent with right ventricle strain likely due to pulmonary hypertension.  Critical Value/emergent results were called by telephone at the time of interpretation on 08/29/2014 at 10:28 am to Dr. Gareth Morgan , who verbally acknowledged these results.   Electronically Signed   By: Franchot Gallo M.D.   On: 08/29/2014 10:28   Dg Chest 1v Repeat Same Day  08/29/2014   CLINICAL DATA:  34 year old male with shortness of breath for 1 day. History sickle cell disease.  EXAM: CHEST - 1 VIEW SAME DAY  COMPARISON:  10/21/2014 and prior radiographs  FINDINGS: Cardiomegaly and left subclavian Port-A-Cath with tip overlying the superior cavoatrial junction again noted.  Increased pulmonary vascularity noted.  There is no evidence of focal airspace disease, pulmonary edema, suspicious pulmonary  nodule/mass, pleural effusion, or pneumothorax. No acute bony abnormalities are identified.  IMPRESSION: Cardiomegaly and increased pulmonary vascularity without acute abnormality.   Electronically Signed   By: Margarette Canada M.D.   On: 08/29/2014 19:13   Other results: EKG: normal EKG, normal sinus rhythm, unchanged from previous tracings, occasional PVC noted, unifocal.   Assessment and Plan: 1. Elevated Troponin vs. Acute Coronary Syndrome: Pt has several factors that  would contribute to elevated Troponin in the absence of  CAD including chronic right heart strain and severe PAH. He has been evaluated several times by Cardiology and assessed to have a demand ischemia which resolves with treatment of dehydration and pain. However he does need further risk stratification with stress test. I will continue ASA 81 mg, Metoprolol and Lovenox at dose of 1 gm.kg BID. I have asked Cardiology to see him for consultation and will continue to measure Troponin levels and check for EKG changes.  2. PVC's: Pt has marginally low potassium. Will replace potassium, check Magnesium and monitor on Telemetry. 3. Elevated D-Dimer: CT-Angiogram negative for Acute pulmonary embolus. 4. Hb SS with crisis: Pt opiate tolerant. Will treat with IV Dilaudid, Toradol adn continue MS Contin. Re-evaluate pain tomorrow.   5. Hypokalemia: Check Magnesium and replace orally.  6. Anemia of chronic Disease: Will transfuse 1 unit in light of elevated troponin to raise Hct as I suspect some hemodilution with IVF.   Time spent 60 minutes. 50% of time spent in discussion, counseling and coordination of care.  Nathasha Fiorillo A. 09/10/2014, 10:06 AM

## 2014-09-10 NOTE — ED Provider Notes (Addendum)
CSN: [id number]     Arrival date & time 09/10/14  0410 History   First MD Initiated Contact with Patient 09/10/14 0536     Chief Complaint  Patient presents with  . 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 He was recently admitted to the hospital for a pain crisis and discharged home yesterday and improved condition. He will returns with pain in his lateral ribs bilaterally, the location of his typical sickle cell pain crises. The pain came on suddenly and awakened him from sleep this morning. It is accompanied by "heavy breathing". He denies fever, chills, nausea, vomiting or diarrhea.  Past Medical History  Diagnosis Date  . Sickle cell anemia   . Blood transfusion   . Acute embolism and thrombosis of right internal jugular vein   . Hypokalemia   . Mood disorder   . History of pulmonary embolus (PE)   . Avascular necrosis   . Leukocytosis     Chronic  . Thrombocytosis     Chronic  . Hypertension   . History of Clostridium difficile infection   . Uses marijuana   . Chronic anticoagulation   . Functional asplenia   . Former smoker   . Second hand tobacco smoke exposure   . Alcohol consumption of one to four drinks per day   . Noncompliance with medication regimen   . Sickle-cell crisis with associated acute chest syndrome 05/13/2013  . Acute chest syndrome 06/18/2013  . Demand ischemia 01/02/2014   Past Surgical History  Procedure Laterality Date  . Right hip replacement      08/2006  . Cholecystectomy      01/2008  . Porta cath placement    . Porta cath removal    . Umbilical hernia repair      01/2008  . Excision of left periauricular cyst      10/2009  . Excision of right ear lobe cyst with primary closur      11/2007  . Portacath placement  01/05/2012    Procedure: INSERTION PORT-A-CATH;  Surgeon: Odis Hollingshead, MD;   Location: [facility];  Service: General;  Laterality: N/A;  ultrasound guiced port a cath insertion with fluoroscopy   Family History  Problem Relation Age of Onset  . Sickle cell trait Mother   . Depression Mother   . Diabetes Mother   . Sickle cell trait Father   . Sickle cell trait Brother    Social History  Substance Use Topics  . Smoking status: Former Smoker -- 13 years    Quit date: 07/08/2010  . Smokeless tobacco: Never Used  . Alcohol Use: No    Review of Systems  All other systems reviewed and are negative.   Allergies  Review of patient's allergies indicates no known allergies.  Home Medications   Prior to Admission medications   Medication Sig Start Date End Date Taking? Authorizing Provider  aspirin 81 MG chewable tablet Chew 1 tablet (81 mg total) by mouth daily. 07/23/14  Yes [name], MD  Cholecalciferol (VITAMIN D) 2000 UNITS tablet Take 1 tablet (2,000 Units total) by mouth daily. 02/13/14  Yes [name], MD  diltiazem (CARDIZEM CD) 120 MG 24 hr capsule Take 1 capsule (120 mg total) by mouth daily. 03/13/14  Yes Costin Karlyne Greenspan, MD  enoxaparin (LOVENOX) 150 MG/ML injection Inject 0.73 mLs (110 mg total) into the skin daily.  Starting tonight 07/23/2014 at 10:00pm Patient taking differently: Inject 110 mg into the skin daily.  08/12/14  Yes Micheline Chapman, NP  folic acid (FOLVITE) 1 MG tablet Take 1 tablet (1 mg total) by mouth every morning. 10/08/13  Yes [name], MD  HYDROmorphone (DILAUDID) 4 MG tablet Take 1 tablet (4 mg total) by mouth every 4 (four) hours as needed for severe pain. 09/09/14  Yes Tresa Garter, MD  hydroxyurea (HYDREA) 500 MG capsule Take 3 capsules (1,500 mg total) by mouth daily. May take with food to minimize GI side effects. 09/09/14  Yes Tresa Garter, MD  lisinopril (PRINIVIL,ZESTRIL) 10 MG tablet Take 1 tablet (10 mg total) by mouth daily. 01/22/14  Yes [name], MD  metoprolol tartrate  (LOPRESSOR) 25 MG tablet Take 1 tablet (25 mg total) by mouth daily. 08/22/13  Yes [name], MD  morphine (MS CONTIN) 30 MG 12 hr tablet Take 1 tablet (30 mg total) by mouth every 12 (twelve) hours. 09/09/14  Yes Tresa Garter, MD  mupirocin ointment (BACTROBAN) 2 % Apply 1 application topically 2 (two) times daily. Apply ointment to left great toe twice a day and apply dry dressing 09/01/14  Yes Newt Minion, MD  potassium chloride SA (K-DUR,KLOR-CON) 20 MEQ tablet Take 1 tablet (20 mEq total) by mouth every morning. 06/09/14  Yes [name], MD  zolpidem (AMBIEN) 10 MG tablet Take 1 tablet (10 mg total) by mouth at bedtime as needed for sleep. 08/08/14  Yes Micheline Chapman, NP   BP 124/84 mmHg  Pulse 104  Temp(Src) 98 F (36.7 C) (Oral)  Resp 22  Ht 6' (1.829 m)  Wt 140 lb (63.504 kg)  BMI 18.98 kg/m2  SpO2 90%   Physical Exam  General: Well-developed, well-nourished male in no acute distress; appearance consistent with age of record HENT: normocephalic; atraumatic Eyes: pupils equal, round and reactive to light; extraocular muscles intact Neck: supple Heart: regular rate and rhythm Lungs: clear to auscultation bilaterally; mild tachypnea Abdomen: soft; nondistended; nontender; no masses or hepatosplenomegaly; bowel sounds present Extremities: No deformity; full range of motion; pulses normal Neurologic: Awake, lethargic, oriented; motor function intact in all extremities and symmetric; no facial droop Skin: Warm and dry Psychiatric: Flat affect    ED Course  Procedures (including critical care time)  CRITICAL CARE Performed by: Vandora Jaskulski L Total critical care time: 35 minutes Critical care time was exclusive of separately billable procedures and treating other patients. Critical care was necessary to treat or prevent imminent or life-threatening deterioration. Critical care was time spent personally by me on the following activities: development of  treatment plan with patient and/or surrogate as well as nursing, discussions with consultants, evaluation of patient's response to treatment, examination of patient, obtaining history from patient or surrogate, ordering and performing treatments and interventions, ordering and review of laboratory studies, ordering and review of radiographic studies, pulse oximetry and re-evaluation of patient's condition.   MDM   Nursing notes and vitals signs, including pulse oximetry, reviewed.  Summary of this visit's results, reviewed by myself:  Labs:  Results for orders placed or performed during the hospital encounter of 09/10/14 (from the past 24 hour(s))  Comprehensive metabolic panel     Status: Abnormal   Collection Time: 09/10/14  5:53 AM  Result Value Ref Range   Sodium 139 135 - 145 mmol/L   Potassium 3.2 (L) 3.5 - 5.1 mmol/L   Chloride 109 101 - 111  mmol/L   CO2 22 22 - 32 mmol/L   Glucose, Bld 115 (H) 65 - 99 mg/dL   BUN 14 6 - 20 mg/dL   Creatinine, Ser 0.81 0.61 - 1.24 mg/dL   Calcium 8.9 8.9 - 10.3 mg/dL   Total Protein 7.9 6.5 - 8.1 g/dL   Albumin 3.8 3.5 - 5.0 g/dL   AST 41 15 - 41 U/L   ALT 27 17 - 63 U/L   Alkaline Phosphatase 67 38 - 126 U/L   Total Bilirubin 3.7 (H) 0.3 - 1.2 mg/dL   GFR calc non Af Amer >60 >60 mL/min   GFR calc Af Amer >60 >60 mL/min   Anion gap 8 5 - 15  CBC with Differential     Status: Abnormal   Collection Time: 09/10/14  5:53 AM  Result Value Ref Range   WBC 21.9 (H) 4.0 - 10.5 K/uL   RBC 2.30 (L) 4.22 - 5.81 MIL/uL   Hemoglobin 7.3 (L) 13.0 - 17.0 g/dL   HCT 21.4 (L) 39.0 - 52.0 %   MCV 93.0 78.0 - 100.0 fL   MCH 31.7 26.0 - 34.0 pg   MCHC 34.1 30.0 - 36.0 g/dL   RDW 21.9 (H) 11.5 - 15.5 %   Platelets 737 (H) 150 - 400 K/uL   Neutrophils Relative % 83 (H) 43 - 77 %   Lymphocytes Relative 5 (L) 12 - 46 %   Monocytes Relative 11 3 - 12 %   Eosinophils Relative 1 0 - 5 %   Basophils Relative 0 0 - 1 %   Neutro Abs 18.2 (H) 1.7 - 7.7 K/uL    Lymphs Abs 1.1 0.7 - 4.0 K/uL   Monocytes Absolute 2.4 (H) 0.1 - 1.0 K/uL   Eosinophils Absolute 0.2 0.0 - 0.7 K/uL   Basophils Absolute 0.0 0.0 - 0.1 K/uL   RBC Morphology POLYCHROMASIA PRESENT   Blood gas, arterial ([facility] & [facility] ONLY)     Status: Abnormal   Collection Time: 09/10/14  5:58 AM  Result Value Ref Range   O2 Content 3.0 L/min   Delivery systems NASAL CANNULA    pH, Arterial 7.427 7.350 - 7.450   pCO2 arterial 31.8 (L) 35.0 - 45.0 mmHg   pO2, Arterial 72.2 (L) 80.0 - 100.0 mmHg   Bicarbonate 20.5 20.0 - 24.0 mEq/L   TCO2 19.7 0 - 100 mmol/L   Acid-base deficit 2.9 (H) 0.0 - 2.0 mmol/L   O2 Saturation 91.4 %   Patient temperature 98.6    Collection site RIGHT BRACHIAL    Drawn by [id number]    Sample type ARTERIAL DRAW   Troponin I     Status: Abnormal   Collection Time: 09/10/14  6:12 AM  Result Value Ref Range   Troponin I 3.48 (HH) <0.031 ng/mL  D-dimer, quantitative (not at [facility])     Status: Abnormal   Collection Time: 09/10/14  6:12 AM  Result Value Ref Range   D-Dimer, Quant 2.48 (H) 0.00 - 0.48 ug/mL-FEU  Brain natriuretic peptide     Status: Abnormal   Collection Time: 09/10/14  6:12 AM  Result Value Ref Range   B Natriuretic Peptide 549.5 (H) 0.0 - 100.0 pg/mL  I-Stat CG4 Lactic Acid, ED     Status: None   Collection Time: 09/10/14  6:45 AM  Result Value Ref Range   Lactic Acid, Venous 0.56 0.5 - 2.0 mmol/L   *Note: Due to a large number of results and/or encounters for  the requested time period, some results have not been displayed. A complete set of results can be found in Results Review.    Imaging Studies: Dg Chest 2 View  09/10/2014   CLINICAL DATA:  Acute onset of sickle cell pain crisis. Decreased O2 saturation. Worsening generalized weakness. Initial encounter.  EXAM: CHEST  2 VIEW  COMPARISON:  Chest radiograph from 08/29/2014  FINDINGS: The lungs are well-aerated. Mild vascular congestion is noted. Minimal bilateral atelectasis or scarring is noted.  There is no evidence of pleural effusion or pneumothorax.  The heart is enlarged. A left-sided chest port is noted ending about the cavoatrial junction. No acute osseous abnormalities are seen. Clips are noted within the right upper quadrant, reflecting prior cholecystectomy.  IMPRESSION: Mild vascular congestion and cardiomegaly noted. Minimal bilateral atelectasis or scarring noted.   Electronically Signed   By: Garald Balding M.D.   On: 09/10/2014 06:00    EKG Interpretation  Date/Time:  Wednesday September 10 2014 06:17:46 EDT Ventricular Rate:  99 PR Interval:  177 QRS Duration: 107 QT Interval:  385 QTC Calculation: 494 R Axis:   -97 Text Interpretation:  Sinus tachycardia Ventricular premature complex Left atrial enlargement RVH with secondary repolarization abnrm Anterolateral infarct, age indeterminate PVC not seen previously Rate is faster Confirmed by Siyah Mault  MD, Jenny Reichmann ([id number]) on 09/10/2014 6:43:51 AM      8:04 AM Troponin and d-dimer just resulted is elevated. Will obtain CT angio chest. Dr. Thomasene Lot will follow up on results and make dispsition.   Shanon Rosser, MD 09/10/14 [id number]  Shanon Rosser, MD 09/10/14 [place], MD 09/10/14 [phone]

## 2014-09-11 ENCOUNTER — Inpatient Hospital Stay (HOSPITAL_COMMUNITY): Payer: Medicare Other

## 2014-09-11 DIAGNOSIS — D72829 Elevated white blood cell count, unspecified: Secondary | ICD-10-CM

## 2014-09-11 DIAGNOSIS — J9621 Acute and chronic respiratory failure with hypoxia: Secondary | ICD-10-CM

## 2014-09-11 DIAGNOSIS — D5701 Hb-SS disease with acute chest syndrome: Secondary | ICD-10-CM | POA: Insufficient documentation

## 2014-09-11 LAB — CBC WITH DIFFERENTIAL/PLATELET
BASOS ABS: 0.1 10*3/uL (ref 0.0–0.1)
Basophils Relative: 1 % (ref 0–1)
EOS PCT: 3 % (ref 0–5)
Eosinophils Absolute: 0.6 10*3/uL (ref 0.0–0.7)
HEMATOCRIT: 23.6 % — AB (ref 39.0–52.0)
HEMOGLOBIN: 8.3 g/dL — AB (ref 13.0–17.0)
LYMPHS PCT: 12 % (ref 12–46)
Lymphs Abs: 2.3 10*3/uL (ref 0.7–4.0)
MCH: 32.4 pg (ref 26.0–34.0)
MCHC: 35.2 g/dL (ref 30.0–36.0)
MCV: 92.2 fL (ref 78.0–100.0)
MONO ABS: 2 10*3/uL — AB (ref 0.1–1.0)
MONOS PCT: 10 % (ref 3–12)
Neutro Abs: 15 10*3/uL — ABNORMAL HIGH (ref 1.7–7.7)
Neutrophils Relative %: 74 % (ref 43–77)
Platelets: 715 10*3/uL — ABNORMAL HIGH (ref 150–400)
RBC: 2.56 MIL/uL — AB (ref 4.22–5.81)
RDW: 19.6 % — AB (ref 11.5–15.5)
WBC: 19.9 10*3/uL — ABNORMAL HIGH (ref 4.0–10.5)

## 2014-09-11 LAB — BASIC METABOLIC PANEL
ANION GAP: 9 (ref 5–15)
BUN: 10 mg/dL (ref 6–20)
CHLORIDE: 109 mmol/L (ref 101–111)
CO2: 21 mmol/L — ABNORMAL LOW (ref 22–32)
CREATININE: 0.91 mg/dL (ref 0.61–1.24)
Calcium: 9 mg/dL (ref 8.9–10.3)
GFR calc non Af Amer: >60 mL/min (ref >60)
Glucose, Bld: 109 mg/dL — ABNORMAL HIGH (ref 65–99)
Potassium: 4 mmol/L (ref 3.5–5.1)
SODIUM: 139 mmol/L (ref 135–145)

## 2014-09-11 LAB — STREP PNEUMONIAE URINARY ANTIGEN: STREP PNEUMO URINARY ANTIGEN: NEGATIVE

## 2014-09-11 LAB — GLUCOSE, CAPILLARY: GLUCOSE-CAPILLARY: 115 mg/dL — AB (ref 65–99)

## 2014-09-11 LAB — PREPARE RBC (CROSSMATCH)

## 2014-09-11 LAB — PROCALCITONIN: Procalcitonin: <0.1 ng/mL

## 2014-09-11 LAB — TROPONIN I: TROPONIN I: 5.79 ng/mL — AB (ref <0.031)

## 2014-09-11 LAB — HEPARIN LEVEL (UNFRACTIONATED): Heparin Unfractionated: 0.43 IU/mL (ref 0.30–0.70)

## 2014-09-11 MED ORDER — HEPARIN (PORCINE) IN NACL 100-0.45 UNIT/ML-% IJ SOLN
1000.0000 [IU]/h | INTRAMUSCULAR | Status: DC
  Administered 2014-09-11 – 2014-09-12 (×2): 1000 [IU]/h via INTRAVENOUS
  Filled 2014-09-11 (×2): qty 250

## 2014-09-11 MED ORDER — HEPARIN BOLUS VIA INFUSION
4000.0000 [IU] | Freq: Once | INTRAVENOUS | Status: AC
  Administered 2014-09-11: 4000 [IU] via INTRAVENOUS
  Filled 2014-09-11: qty 4000

## 2014-09-11 MED ORDER — SODIUM CHLORIDE 0.9 % IV SOLN
25.0000 mg | INTRAVENOUS | Status: DC | PRN
  Administered 2014-09-11 – 2014-09-16 (×13): 25 mg via INTRAVENOUS
  Filled 2014-09-11 (×26): qty 0.5

## 2014-09-11 MED ORDER — SODIUM CHLORIDE 0.9 % IV SOLN
Freq: Once | INTRAVENOUS | Status: AC
  Administered 2014-09-11: 12:00:00 via INTRAVENOUS

## 2014-09-11 NOTE — Code Documentation (Signed)
Patient ID: [name], male   DOB: [birth year], 34 y.o.   MRN: [id number]    Code Status: Discussed code status with patient witnessed by Nurse Prince [place], RN. Pt wants to be full code with CPR, intubation adn pressor support as necessary. However he dose not want prolonged ventilation is poor chances of recovery to a reasonable pint of independence. He states that he does not want to be a burden to his mother and does not want people coming to see him in a setting of being able to interact with them. In his absence of ability to speak for himself he has designated his mother [name])  to make decisions on his behalf.   Aldred Mase A.

## 2014-09-11 NOTE — Progress Notes (Signed)
Chaplain visited patient who had concerns regarding Advance Directives. Chaplain attempted to discuss Advance Directives with patient; however patient appeared to be drowsy and not alert to listen attentively. Chaplain left patient with AD to look over. Chaplain will follow.   09/11/14 1100  Clinical Encounter Type  Visited With Patient  Visit Type Initial;Spiritual support;Social support  Referral From Nurse  Consult/Referral To Chaplain

## 2014-09-11 NOTE — Progress Notes (Signed)
ANTICOAGULATION CONSULT NOTE - Initial Consult  Pharmacy Consult for Heparin Indication: pulmonary embolus  No Known Allergies  Patient Measurements: Height: 6' (182.9 cm) Weight: 147 lb 0.8 oz (66.7 kg) IBW/kg (Calculated) : 77.6  Vital Signs: Temp: 98.6 F (37 C) (08/11 0800) Temp Source: Oral (08/11 0800) BP: 107/69 mmHg (08/11 0901) Pulse Rate: 72 (08/11 0901)  Labs:  Recent Labs  09/09/14 1125 09/10/14 0553  09/10/14 0957 09/10/14 1600 09/11/14 0238  HGB 7.9* 7.3*  --   --   --  8.3*  HCT 23.6* 21.4*  --   --   --  23.6*  PLT 753* 737*  --   --   --  715*  CREATININE  --  0.81  --   --   --  0.91  TROPONINI  --   --   < > 6.34* 7.76* 5.79*  < > = values in this interval not displayed.  Estimated Creatinine Clearance: 106.9 mL/min (by C-G formula based on Cr of 0.91).   Medical History: Past Medical History  Diagnosis Date  . Sickle cell anemia   . Blood transfusion   . Acute embolism and thrombosis of right internal jugular vein   . Hypokalemia   . Mood disorder   . History of pulmonary embolus (PE)   . Avascular necrosis   . Leukocytosis     Chronic  . Thrombocytosis     Chronic  . Hypertension   . History of Clostridium difficile infection   . Uses marijuana   . Chronic anticoagulation   . Functional asplenia   . Former smoker   . Second hand tobacco smoke exposure   . Alcohol consumption of one to four drinks per day   . Noncompliance with medication regimen   . Sickle-cell crisis with associated acute chest syndrome 05/13/2013  . Acute chest syndrome 06/18/2013  . Demand ischemia 01/02/2014    Medications:  Prescriptions prior to admission  Medication Sig Dispense Refill Last Dose  . aspirin 81 MG chewable tablet Chew 1 tablet (81 mg total) by mouth daily. 30 tablet 11 09/09/2014 at Unknown time  . Cholecalciferol (VITAMIN D) 2000 UNITS tablet Take 1 tablet (2,000 Units total) by mouth daily. 90 tablet 3 09/09/2014 at Unknown time  . diltiazem  (CARDIZEM CD) 120 MG 24 hr capsule Take 1 capsule (120 mg total) by mouth daily. 30 capsule 1 09/09/2014 at Unknown time  . enoxaparin (LOVENOX) 150 MG/ML injection Inject 0.73 mLs (110 mg total) into the skin daily. Starting tonight 07/23/2014 at 10:00pm (Patient taking differently: Inject 110 mg into the skin daily. ) 22 Syringe 5 09/08/2014 at 0930  . folic acid (FOLVITE) 1 MG tablet Take 1 tablet (1 mg total) by mouth every morning. 30 tablet 11 09/09/2014 at Unknown time  . HYDROmorphone (DILAUDID) 4 MG tablet Take 1 tablet (4 mg total) by mouth every 4 (four) hours as needed for severe pain. 90 tablet 0 09/10/2014 at 0300  . hydroxyurea (HYDREA) 500 MG capsule Take 3 capsules (1,500 mg total) by mouth daily. May take with food to minimize GI side effects. 60 capsule 3 09/09/2014 at Unknown time  . lisinopril (PRINIVIL,ZESTRIL) 10 MG tablet Take 1 tablet (10 mg total) by mouth daily. 30 tablet 1 09/09/2014 at Unknown time  . metoprolol tartrate (LOPRESSOR) 25 MG tablet Take 1 tablet (25 mg total) by mouth daily. 30 tablet 6 09/09/2014 at 2130  . morphine (MS CONTIN) 30 MG 12 hr tablet Take 1 tablet (  30 mg total) by mouth every 12 (twelve) hours. 60 tablet 0 09/08/2014 at Unknown time  . mupirocin ointment (BACTROBAN) 2 % Apply 1 application topically 2 (two) times daily. Apply ointment to left great toe twice a day and apply dry dressing 22 g 6 09/09/2014 at Unknown time  . potassium chloride SA (K-DUR,KLOR-CON) 20 MEQ tablet Take 1 tablet (20 mEq total) by mouth every morning. 30 tablet 3 09/09/2014 at Unknown time  . zolpidem (AMBIEN) 10 MG tablet Take 1 tablet (10 mg total) by mouth at bedtime as needed for sleep. 30 tablet 0 2 weeks ago    Assessment: 34 yo M admitted with sickle cell crisis and acute hypoxic respiratory failure. He is currently on broad-spectrum antibiotics for possible PNA.  He is also chronically on Lovenox 1.5mg /kg/day as outpatient for chronic PE.  Last dose 8/10 at 10am.  Pharmacy is asked  to transition patient to IV heparin due to possible need for line placement.   Goal of Therapy:  Heparin level 0.3-0.7 units/ml Monitor platelets by anticoagulation protocol: Yes   Plan:  Give 4000 units bolus x 1 Start heparin infusion at 1000 units/hr Check anti-Xa level in 6 hours and daily while on heparin Continue to monitor H&H and platelets  [name], Lavonia Drafts 09/11/2014,10:34 AM

## 2014-09-11 NOTE — Progress Notes (Signed)
ANTICOAGULATION CONSULT NOTE - Follow Up Consult  Pharmacy Consult for heparin Indication: chronic pulmonary embolus  No Known Allergies  Patient Measurements: Height: 6' (182.9 cm) Weight: 147 lb 0.8 oz (66.7 kg) IBW/kg (Calculated) : 77.6  Vital Signs: Temp: 98.7 F (37.1 C) (08/11 1824) Temp Source: Oral (08/11 1824) BP: 92/68 mmHg (08/11 2100) Pulse Rate: 75 (08/11 2100)  Labs:  Recent Labs  09/09/14 1125 09/10/14 0553  09/10/14 0957 09/10/14 1600 09/11/14 0238 09/11/14 2115  HGB 7.9* 7.3*  --   --   --  8.3*  --   HCT 23.6* 21.4*  --   --   --  23.6*  --   PLT 753* 737*  --   --   --  715*  --   HEPARINUNFRC  --   --   --   --   --   --  0.43  CREATININE  --  0.81  --   --   --  0.91  --   TROPONINI  --   --   < > 6.34* 7.76* 5.79*  --   < > = values in this interval not displayed.  Estimated Creatinine Clearance: 106.9 mL/min (by C-G formula based on Cr of 0.91).   Medications:  Infusions:  . sodium chloride 100 mL/hr at 09/11/14 1800  . heparin 1,000 Units/hr (09/11/14 1452)    Assessment: 33 y/o male admitted with sickle cell crisis and acute hypoxic respiratory failure on chronic outpatient Lovenox 1.5 mg/kg/day for chronic PE. Last dose 8/10 at 10am. Pharmacy is asked to transition patient to IV heparin due to need for line placement for Hb electrophoresis.   Heparin level is therapeutic at 0.43 on 1000 units/hr. No bleeding noted.  Goal of Therapy:  Heparin level 0.3-0.7 units/ml Monitor platelets by anticoagulation protocol: Yes   Plan:  - Continue heparin drip at 1000 units/hr - Daily heparin level and CBC - Monitor for s/sx of bleeding  [facility], [place].D., BCPS Clinical Pharmacist Pager: [phone] 09/11/2014 10:10 PM

## 2014-09-11 NOTE — Progress Notes (Signed)
Name: [name] MRN: [id number] DOB: April 04, 1980    ADMISSION DATE:  09/10/2014 CONSULTATION DATE:  09/11/2014  REFERRING MD :  Zigmund Daniel  CHIEF COMPLAINT:  Rib pain, SOB  BRIEF PATIENT DESCRIPTION: 34 y.o. M with known sickle cell disease, admitted 8/10 with sickle cell crisis.  Had increasing O2 demands so transferred to SDU and PCCM consulted for further recs.   SIGNIFICANT EVENTS  8/10 - admitted with sickle cell crisis.  STUDIES:  CTA chest 8/10 >>> chronic bilateral lowe lobe PE.  No new PE's seen.  Cardiac enlargement, pulmonary and osseous stigmata of sickle cell disease. CXR 8/10 >>> chronic cardiomegaly and chronic vascular congestion.  SUBJECTIVE:  Continues to have chest wall pain. No pleurisy. Denies any dyspnea on NRB mask. No joint pain, swelling, or erythema.  ROS:  Patient denies any subjective fever, chills, or sweats. No abdominal pain or nausea.  VITAL SIGNS: Temp:  [97.4 F (36.3 C)-98.6 F (37 C)] 98.6 F (37 C) (08/11 0800) Pulse Rate:  [72-99] 72 (08/11 0901) Resp:  [13-22] 18 (08/11 0915) BP: (97-123)/(68-93) 107/69 mmHg (08/11 0901) SpO2:  [92 %-100 %] 100 % (08/11 0915) FiO2 (%):  [100 %] 100 % (08/11 0600) Weight:  [147 lb 0.8 oz (66.7 kg)-148 lb 13 oz (67.5 kg)] 147 lb 0.8 oz (66.7 kg) (08/11 0600)  PHYSICAL EXAMINATION: General: Male. No distress. Awake. Alert. Neuro: Moving all 4 extremities equally. No meningismus. HEENT: PERRL. No scleral injection. Tacky MM. Cardiovascular: Regular rate. No edema. Unable to appreciate JVD. Lungs: Normal WOB on NRB mask. CTAB. Speaking in complete sentences. Abdomen: Soft. Nontender. Normal bowel sounds. Musculoskeletal: No joint deformity or effusion. Skin: Dressing in place over right great toe. No other rash or bruising appreciated.    Recent Labs Lab 09/10/14 0553 09/11/14 0238  NA 139 139  K 3.2* 4.0  CL 109 109  CO2 22 21*  BUN 14 10  CREATININE 0.81 0.91  GLUCOSE 115* 109*     Recent Labs Lab 09/09/14 1125 09/10/14 0553 09/11/14 0238  HGB 7.9* 7.3* 8.3*  HCT 23.6* 21.4* 23.6*  WBC 22.8* 21.9* 19.9*  PLT 753* 737* 715*   Dg Chest 1 View  09/10/2014   CLINICAL DATA:  Respiratory failure. Hypoxemia. Sickle cell patient.  EXAM: CHEST  1 VIEW  COMPARISON:  Radiographs and CT earlier today.  FINDINGS: 1813 hour. Stable cardiomegaly. The left subclavian Port-A-Cath is unchanged at the SVC right atrial junction. Stable chronic vascular congestion and basilar scarring. No superimposed edema, confluent airspace opacity or significant pleural effusion. 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  IMPRESSION: Stable examination compared with earlier study today. Chronic cardiomegaly and chronic vascular congestion.   Electronically Signed   By: Richardean Sale M.D.   On: 09/10/2014 18:27   Dg Chest 2 View  09/10/2014   CLINICAL DATA:  Acute onset of sickle cell pain crisis. Decreased O2 saturation. Worsening generalized weakness. Initial encounter.  EXAM: CHEST  2 VIEW  COMPARISON:  Chest radiograph from 08/29/2014  FINDINGS: The lungs are well-aerated. Mild vascular congestion is noted. Minimal bilateral atelectasis or scarring is noted. There is no evidence of pleural effusion or pneumothorax.  The heart is enlarged. A left-sided chest port is noted ending about the cavoatrial junction. No acute osseous abnormalities are seen. Clips are noted within the right upper quadrant, reflecting prior cholecystectomy.  IMPRESSION: Mild vascular congestion and cardiomegaly noted. Minimal bilateral atelectasis or scarring noted.   Electronically Signed   By:  Garald Balding M.D.   On: 09/10/2014 06:00   Ct Angio Chest Pe W/cm &/or Wo Cm  09/10/2014   CLINICAL DATA:  Short of breath.  EXAM: CT ANGIOGRAPHY CHEST WITH CONTRAST  TECHNIQUE: Multidetector CT imaging of the chest was performed using the standard protocol during bolus administration of intravenous contrast.  Multiplanar CT image reconstructions and MIPs were obtained to evaluate the vascular anatomy.  CONTRAST:  187mL OMNIPAQUE IOHEXOL 350 MG/ML SOLN  COMPARISON:  08/29/2014  FINDINGS: Mediastinum: There is moderate cardiac enlargement. No pericardial effusion. The trachea is patent and appears midline. Normal appearance of the esophagus. Prominent soft tissue is identified within the anterior mediastinum. Stress set prominent anterior mediastinal adenopathy is again identified. Index pre-vascular lymph node measures 1 cm, image 35/series 4. Previously 1.2 cm. Bilateral lower lobe pulmonary artery filling defects are again noted and are in the same distribution as the previous exam. No new pulmonary emboli identified.  Lungs/Pleura: There is no pleural effusion. Mosaic attenuation pattern is identified throughout both lungs compatible with pulmonary emboli. No airspace consolidation. Scarring is noted predominantly involving the lower lobes.  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 Diffuse increased attenuation throughout the liver is noted. The visualized adrenal glands are unremarkable.  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  Review of the MIP images confirms the above findings.  IMPRESSION: 1. Again noted are chronic bilateral lower lobe pulmonary emboli. No new areas of pulmonary emboli identified. 2. Cardiac enlargement 3. Pulmonary and osseous stigmata of sickle cell disease.   Electronically Signed   By: Kerby Moors M.D.   On: 09/10/2014 09:00   Dg Chest Port 1 View  09/11/2014   CLINICAL DATA:  Respiratory failure.  EXAM: PORTABLE CHEST - 1 VIEW  COMPARISON:  09/10/2014.  FINDINGS: PowerPort catheter noted in good anatomic position. Mediastinum and hilar structures normal. Cardiomegaly with mild pulmonary vascular prominence and interstitial prominence. Mild  component of congestive heart failure cannot be excluded. No pleural effusion or pneumothorax.  IMPRESSION: 1. Power port catheter noted with tip in stable position.  2. Severe cardiomegaly again noted. Mild pulmonary vascular prominence interstitial prominence. Mild component of congestive heart failure cannot be excluded.   Electronically Signed   By: Marcello Moores  Register   On: 09/11/2014 07:14    ASSESSMENT / PLAN: 1)  Acute Hypoxic Respiratory Failure:  Secondary to Severe Acute Chest Syndrome vs Pneumonia. Primary team plans to transfuse patient further. Continuing oxygen to maintain Sat >94%. Low threshold for intubation.  2)  Acute Chest Syndrome:  Primary service ordering RBC Electropharesis to determine percentages as patient may require PRBC exchange.  3)  Pulmonary Hypertension:  Multifactorial from PE & Sickle Cell Disease.   4)  Sickle Cell Crisis:  Continuing IVF, Hydrea, Toradol q6hr, & Diluadid.  5)  HCAP:  Trending leukocytosis. Continuing broad spectrum antibiotics w/ Vancomycin & Zosyn. Checking PCT algorithm, Respiratory Viral Panel PCR, Urine Legionella, & Urine Strep.  6)  H/O PE:  D/C Lovenox. Starting heparin drip per pharmacy protocol.  7)  Diet:  Sips & chips.  8)  Prophylaxis:  Therapeutic anticoagulation.   Today's Summary:  Spoke with hospitalist service. Transferring patient to 35M at [facility]. Patient's oxygen requirement continues to increase and with bilateral parenchymal infiltrates his acute chest is worsening and now severe. Strong consideration for exchange transfusion if he worsens. Dr. Zigmund Daniel has spoke with Dr. Beryle Beams and  made him aware of this possibility. Holding on placement of catheter as we transition to heparin infusion off of Lovenox.  I have spent a total of 45 minutes caring for the patient, discussing the plan of care with Dr. Zigmund Daniel and reviewing the patient's EMR.  Sonia Baller Ashok Cordia, M.D. [facility] Pager:   [phone] After 3pm or if no response, call [phone] 09/11/2014, 9:44 AM

## 2014-09-11 NOTE — Progress Notes (Signed)
SICKLE CELL SERVICE PROGRESS NOTE  TAIDEN RAYBOURN EYC:[id number] DOB: [birth year] DOA: 09/10/2014 PCP: [name], MD  Assessment/Plan: Principal Problem:   Hb-SS disease with crisis Active Problems:   Functional asplenia   Pulmonary HTN   Cor pulmonale, chronic   Chronic anticoagulation   Hypoxia   Anemia of chronic disease   Hx of pulmonary embolus   Essential hypertension   Chronic pain syndrome   Elevated troponin I level   PVC's (premature ventricular contractions)   Abnormal EKG   Hypoxemia   Acute respiratory failure with hypoxia   Acute chest syndrome in sickle crisis  1. Acute Chest Syndrome: Pt developed Acute chest syndrome yesterday afternoon and was transferred to the SDU. He continues to have an increasing demand for Oxygen. His Hb is currently 8.3. I have discussed this with Dr. Ashok Cordia from [place]. I agree with his recommendation to  proceed with an exchange transfusion. I recommend at least transfusing to a goal of 1o while awaiting transfer to [facility]  MICU for exchange transfusion. Continue empiric antibiotics and supportive care and consider intubation sooner rather than later. Ordered Hb Electrophoresis.  2. Acute on chronic hypoxic respiratory failure: Secondary to acute chest syndrome. Will likely require red cell pheresis. However will start transfusion to decrease % Hb S.  3. Leukocytosis: This is likely secondary to physiology of crisis and acute chest. He has no signs of infection but in light of the acute chest and significant hypoxemia he has been placed empirically on broad spectrum antibiotics.  4. 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 5. Hb SS with crisis: Continue PCA and Toradol.  6. Hypokalemia: replaced orally and now Eukalemic. Magnesium was also borderlione adn replaced. 7. Elevated Troponin: Troponin trending down. Feel that this is likely related to [facility] and Acute Chest syndrome. Continue ASA and  Metoprolol. 8. Anemia of chronic Disease in setting of Acute Chest Syndrome: Pt has known Anti-E and Anti-V antibodies. He has been able to receive anti-C,E,Kell blood without any clinical sequela. 9. Chronic pain: Continue MS Contin 10. Medication adherence: Pt is supposed to be on Hydrea but MCV is not reflective of compliance. 11. Pulmonary Hypertension: Pt has severe pulmonary HTN with Chronic Right Sided systolic Heart Failure. He has never had a right heart cath but repeated ECHO has shown his PA pressures to approach 60 and TRV of 3.33 m/sec. He has been seen by Dr, Felisa Bonier at [facility] and referred to both Cardiology adn PUlmonology but has not followed up. His prognosis with regard to plumonary HTN is poor and he is not a candidate for heart-lung transplant.  12. Code Status: Discussed code status with patient witnessed by Nurse Prince Rome, RN. Pt wants to be full code with CPR, intubation adn pressor support as necessary. However he dose not want prolonged ventilation is poor chances of recovery to a reasonable pint of independence. He states that he does not want to be a burden to his mother and does not want people coming to see him in a setting of being able to interact with them. In his absence of ability to speak for himself he has designated his mother to make decisions on his behalf. Will request a SW or Chaplin to assist with completing and advance directive.    Code Status: Discussed code status with patient witnessed by Nurse Prince Rome, RN. Pt wants to be full code with CPR, intubation adn pressor support as necessary. However he dose not want prolonged  ventilation is poor chances of recovery to a reasonable pint of independence. He states that he does not want to be a burden to his mother and does not want people coming to see him in a setting of being able to interact with them. In his absence of ability to speak for himself he has designated his mother [name])  to make decisions on his behalf.  Family Communication: N/A. Pt alert and oriented x 3. Disposition Plan: transfer to [facility] to Critical Care Service. Spoke wit Dr. Beryle Beams to make aware of possible need for Red Cell Pheresis. Dr. Ashok Cordia will write the orders.  Jaiden Wahab A.  Pager [phone]. If 7PM-7AM, please contact night-coverage.  09/11/2014, 9:54 AM  LOS: 1 day   Admitting History: Opiate tolerant patient with Hb SS who presented to the ED with pain in b/l ribs typical of sicke cell pain. Pt describes the pain as sharp and throbbing in nature and non-radiating. At it's greatest intensity. Pain was 10/10 and patient took oral Dilaudid without much relief. Thus he came to the ED. Of note, patient is supposed to be on MS Contin on a daily basis but has been unable to obtain prescription so has not had MS Contin since last week. He denies dizziness, Pre-cordial chest wall pain, pain with deep breathing, SOB, DOE, F/C, N/V/D. He also has known PAH and is chronically hypoxic with an Oxygen requirement of 3 L/min during activity and at night. However he has not been compliant wit the use of Oxygen.  Incidental findings during the course of his ED evaluation was an elevated Troponin of 3.48 without any significant EKG changes. He was also found to have an elevated BNP of 549.5 which is below his BNP Level of 1117 12 days go and chronically elevated pro-BNP as high as 5000 in the absence of N-STEMI. He was previously evaluated by Cardiology (07/23/2014) who recommended [facility] as an out-patient but patient has not yet been referred. He was also found to have an elevated D-dimer and a CT angiogram demonstrated chronic bi-lateral PE but no new PE. Please note that patient missed one dose of Lovenox yesterday but states that he has been compliant with it's use otherwise  Consultants:  CCM  Cardiology  Procedures:  None  Antibiotics:  Vancomycin 8/10 >>  Zosyn 8/10  >>  HPI/Subjective: Pt states that his pain is much better controlled and he feels fine with regard to pain management. He is annoyed because he is NPO in the event of intubation. We have had a discussion re: Code status as documented above. I have offered to speak with him and his mother as he feels that the conversation with his mother may be difficult.  Objective: Filed Vitals:   09/11/14 0600 09/11/14 0800 09/11/14 0901 09/11/14 0915  BP: 98/68 107/69 107/69   Pulse: 73 73 72   Temp:  98.6 F (37 C)    TempSrc:  Oral    Resp: 15 17  18   Height:      Weight: 147 lb 0.8 oz (66.7 kg)     SpO2: 100% 98%  100%   Weight change: 8 lb 13 oz (3.996 kg)  Intake/Output Summary (Last 24 hours) at 09/11/14 0954 Last data filed at 09/11/14 0900  Gross per 24 hour  Intake   2550 ml  Output   3000 ml  Net   -450 ml    General: Alert, awake, oriented x3, in no acute distress.  HEENT: [place]/AT  PEERL, EOMI, anicteric Neck: Trachea midline,  no masses, no thyromegal,y no JVD, no carotid bruit OROPHARYNX:  Moist, No exudate/ erythema/lesions.  Heart: Regular rate and rhythm, III/VI SEM at base without radiation.   Loud P2 noted. Lungs: Clear to auscultation, no wheezing or rhonchi noted. No increased work of breathing noted.  Abdomen: Soft, nontender, nondistended, positive bowel sounds, no masses no hepatosplenomegaly noted.  Neuro: No focal neurological deficits noted cranial nerves II through XII grossly intact.  Strength at functional baseline in bilateral upper and lower extremities. Musculoskeletal: No warm swelling or erythema around joints, no spinal tenderness noted. Psychiatric: Patient alert and oriented x3, good insight and cognition, good recent to remote recall.    Data Reviewed: Basic Metabolic Panel:  Recent Labs Lab 09/10/14 0553 09/10/14 0645 09/11/14 0238  NA 139  --  139  K 3.2*  --  4.0  CL 109  --  109  CO2 22  --  21*  GLUCOSE 115*  --  109*  BUN 14  --  10   CREATININE 0.81  --  0.91  CALCIUM 8.9  --  9.0  MG  --  1.6*  --    Liver Function Tests:  Recent Labs Lab 09/10/14 0553  AST 41  ALT 27  ALKPHOS 67  BILITOT 3.7*  PROT 7.9  ALBUMIN 3.8   No results for input(s): LIPASE, AMYLASE in the last 168 hours. No results for input(s): AMMONIA in the last 168 hours. CBC:  Recent Labs Lab 09/09/14 1125 09/10/14 0553 09/11/14 0238  WBC 22.8* 21.9* 19.9*  NEUTROABS 18.3* 18.2* 15.0*  HGB 7.9* 7.3* 8.3*  HCT 23.6* 21.4* 23.6*  MCV 93.3 93.0 92.2  PLT 753* 737* 715*   Cardiac Enzymes:  Recent Labs Lab 09/10/14 0612 09/10/14 0957 09/10/14 1600 09/11/14 0238  TROPONINI 3.48* 6.34* 7.76* 5.79*   BNP (last 3 results)  Recent Labs  08/29/14 0810 09/10/14 0612  BNP 1117.0* 549.5*    ProBNP (last 3 results)  Recent Labs  01/01/14 0909  PROBNP 5790.0*    CBG: No results for input(s): GLUCAP in the last 168 hours.  Recent Results (from the past 240 hour(s))  Culture, blood (routine x 2)     Status: None   Collection Time: 09/01/14  1:40 PM  Result Value Ref Range Status   Specimen Description BLOOD RIGHT HAND  Final   Special Requests IN PEDIATRIC BOTTLE 4CC  Final   Culture   Final    NO GROWTH 5 DAYS Performed at [facility]    Report Status 09/06/2014 FINAL  Final  Culture, blood (routine x 2)     Status: None   Collection Time: 09/01/14  1:50 PM  Result Value Ref Range Status   Specimen Description BLOOD RIGHT ARM  Final   Special Requests IN PEDIATRIC BOTTLE 4CC  Final   Culture   Final    NO GROWTH 5 DAYS Performed at [facility]    Report Status 09/06/2014 FINAL  Final     Studies: Dg Chest 1 View  09/10/2014   CLINICAL DATA:  Respiratory failure. Hypoxemia. Sickle cell patient.  EXAM: CHEST  1 VIEW  COMPARISON:  Radiographs and CT earlier today.  FINDINGS: 1813 hour. Stable cardiomegaly. The left subclavian Port-A-Cath is unchanged at the SVC right atrial junction. Stable  chronic vascular congestion and basilar scarring. No superimposed edema, confluent airspace opacity or significant pleural effusion. 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  IMPRESSION: Stable examination compared with earlier study today. Chronic cardiomegaly and chronic vascular congestion.   Electronically Signed   By: Richardean Sale M.D.   On: 09/10/2014 18:27   Dg Chest 2 View  09/10/2014   CLINICAL DATA:  Acute onset of sickle cell pain crisis. Decreased O2 saturation. Worsening generalized weakness. Initial encounter.  EXAM: CHEST  2 VIEW  COMPARISON:  Chest radiograph from 08/29/2014  FINDINGS: The lungs are well-aerated. Mild vascular congestion is noted. Minimal bilateral atelectasis or scarring is noted. There is no evidence of pleural effusion or pneumothorax.  The heart is enlarged. A left-sided chest port is noted ending about the cavoatrial junction. No acute osseous abnormalities are seen. Clips are noted within the right upper quadrant, reflecting prior cholecystectomy.  IMPRESSION: Mild vascular congestion and cardiomegaly noted. Minimal bilateral atelectasis or scarring noted.   Electronically Signed   By: Garald Balding M.D.   On: 09/10/2014 06:00   Dg Chest 2 View  08/29/2014   CLINICAL DATA:  Sickle cell crisis. Chest pain. Severe bilateral lower extremity pain.  EXAM: CHEST  2 VIEW  COMPARISON:  08/24/2014.  FINDINGS: No change. Artifact overlies the chest. Cardiac silhouette remains enlarged. Mediastinal shadows are otherwise normal. Power port inserted from the left subclavian vein has its tip in the SVC just above the right atrium. Vascular plethora remains present without edema or effusions. Pulmonary scarring remains present.  IMPRESSION: No acute finding. Cardiomegaly. Pulmonary vascular plethora. Pulmonary scarring.   Electronically Signed   By: Nelson Chimes M.D.   On: 08/29/2014 08:49   Dg Chest 2 View  08/24/2014   CLINICAL DATA:  Sickle cell crisis,  shortness of breath  EXAM: CHEST  2 VIEW  COMPARISON:  08/05/2014  FINDINGS: Moderate enlargement of the cardiac silhouette is reidentified. Diffusely prominent interstitial markings are stable with areas of linear probable superimposed scarring. No pleural effusion. Cholecystectomy clips are noted. No acute osseous finding. Left-sided Port-A-Cath in place with tip at the cavoatrial junction.  IMPRESSION: No focal acute finding.   Electronically Signed   By: Conchita Paris M.D.   On: 08/24/2014 08:41   Dg Ankle Complete Left  08/24/2014   CLINICAL DATA:  Sickle cell crisis with left lower leg and ankle pain and swelling.  EXAM: LEFT ANKLE COMPLETE - 3+ VIEW  COMPARISON:  12/21/2006  FINDINGS: Diffuse soft tissue swelling is seen surrounding the left ankle. No evidence of fracture, dislocation or bony destruction. The ankle mortise shows normal alignment. No bony lesions or visible bony infarcts by x-ray. No significant degenerative changes.  IMPRESSION: Soft tissue swelling surrounding left ankle. No underlying bony abnormalities are identified.   Electronically Signed   By: Aletta Edouard M.D.   On: 08/24/2014 08:35   Ct Angio Chest Pe W/cm &/or Wo Cm  09/10/2014   CLINICAL DATA:  Short of breath.  EXAM: CT ANGIOGRAPHY CHEST WITH CONTRAST  TECHNIQUE: Multidetector CT imaging of the chest was performed using the standard protocol during bolus administration of intravenous contrast. Multiplanar CT image reconstructions and MIPs were obtained to evaluate the vascular anatomy.  CONTRAST:  15mL OMNIPAQUE IOHEXOL 350 MG/ML SOLN  COMPARISON:  08/29/2014  FINDINGS: Mediastinum: There is moderate cardiac enlargement. No pericardial effusion. The trachea is patent and appears midline. Normal appearance of the esophagus. Prominent soft tissue is identified within the anterior mediastinum. Stress set prominent anterior mediastinal adenopathy is again identified. Index pre-vascular lymph node measures 1 cm, image  35/series 4. Previously 1.2 cm. Bilateral  lower lobe pulmonary artery filling defects are again noted and are in the same distribution as the previous exam. No new pulmonary emboli identified.  Lungs/Pleura: There is no pleural effusion. Mosaic attenuation pattern is identified throughout both lungs compatible with pulmonary emboli. No airspace consolidation. Scarring is noted predominantly involving the lower lobes.  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 Diffuse increased attenuation throughout the liver is noted. The visualized adrenal glands are unremarkable.  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  Review of the MIP images confirms the above findings.  IMPRESSION: 1. Again noted are chronic bilateral lower lobe pulmonary emboli. No new areas of pulmonary emboli identified. 2. Cardiac enlargement 3. Pulmonary and osseous stigmata of sickle cell disease.   Electronically Signed   By: Kerby Moors M.D.   On: 09/10/2014 09:00   Ct Angio Chest Pe W/cm &/or Wo Cm  08/29/2014   CLINICAL DATA:  Chest pain.  Sickle cell.  Recent pulmonary embolism  EXAM: CT ANGIOGRAPHY CHEST WITH CONTRAST  TECHNIQUE: Multidetector CT imaging of the chest was performed using the standard protocol during bolus administration of intravenous contrast. Multiplanar CT image reconstructions and MIPs were obtained to evaluate the vascular anatomy.  CONTRAST:  138mL OMNIPAQUE IOHEXOL 350 MG/ML SOLN  COMPARISON:  CT chest 07/20/2014  FINDINGS: Small filling defects in the lower lobe pulmonary arteries are unchanged from the prior study and consistent with residual pulmonary emboli which are not dissolved. No new emboli identified. It is unlikely these are recurrent emboli as the are in the exact same location as the prior study.  Cardiac enlargement without pericardial effusion. Right  ventricular enlargement. RV/LV ratio is 1.5 suggesting right heart strain. Pulmonary arteries are enlarged.  Port-A-Cath in the SVC unchanged.  Because  Anterior mediastinal adenopathy measuring up to 12 mm unchanged. No hilar adenopathy.  Patchy airspace densities in the posterior lung bases bilaterally unchanged most consistent with atelectasis or scarring. Additional ground-glass density in the lungs bilaterally most notably in the right lung are unchanged and may be related to chronic lung disease or pulmonary emboli.  Upper abdomen negative.  Negative for   effusion.  Review of the MIP images confirms the above findings.  IMPRESSION: Small filling defects in the lower lobe pulmonary arteries unchanged from the CT of 07/20/2014 compatible with residual emboli which have not dissolved. No new emboli identified.  Cardiac enlargement. Pulmonary artery enlargement. Enlarged right ventricle. RV/LV ratio 1.5 consistent with right ventricle strain likely due to pulmonary hypertension.  Critical Value/emergent results were called by telephone at the time of interpretation on 08/29/2014 at 10:28 am to Dr. Gareth Morgan , who verbally acknowledged these results.   Electronically Signed   By: Franchot Gallo M.D.   On: 08/29/2014 10:28   Dg Chest 1v Repeat Same Day  08/29/2014   CLINICAL DATA:  34 year old male with shortness of breath for 1 day. History sickle cell disease.  EXAM: CHEST - 1 VIEW SAME DAY  COMPARISON:  10/21/2014 and prior radiographs  FINDINGS: Cardiomegaly and left subclavian Port-A-Cath with tip overlying the superior cavoatrial junction again noted.  Increased pulmonary vascularity noted.  There is no evidence of focal airspace disease, pulmonary edema, suspicious pulmonary nodule/mass, pleural effusion, or pneumothorax. No acute bony abnormalities are identified.  IMPRESSION: Cardiomegaly and increased pulmonary vascularity without acute abnormality.   Electronically Signed   By: Margarette Canada M.D.    On:  08/29/2014 19:13   Dg Chest Port 1 View  09/11/2014   CLINICAL DATA:  Respiratory failure.  EXAM: PORTABLE CHEST - 1 VIEW  COMPARISON:  09/10/2014.  FINDINGS: PowerPort catheter noted in good anatomic position. Mediastinum and hilar structures normal. Cardiomegaly with mild pulmonary vascular prominence and interstitial prominence. Mild component of congestive heart failure cannot be excluded. No pleural effusion or pneumothorax.  IMPRESSION: 1. Power port catheter noted with tip in stable position.  2. Severe cardiomegaly again noted. Mild pulmonary vascular prominence interstitial prominence. Mild component of congestive heart failure cannot be excluded.   Electronically Signed   By: [place]   On: 09/11/2014 07:14    Scheduled Meds: . sodium chloride   Intravenous Once  . antiseptic oral rinse  7 mL Mouth Rinse q12n4p  . aspirin  81 mg Oral Daily  . chlorhexidine  15 mL Mouth Rinse BID  . cholecalciferol  2,000 Units Oral Daily  . diltiazem  120 mg Oral Daily  . enoxaparin (LOVENOX) injection  1 mg/kg Subcutaneous Q12H  . folic acid  1 mg Oral q morning - 10a  . HYDROmorphone PCA 2 mg/mL   Intravenous 6 times per day  . hydroxyurea  1,500 mg Oral Daily  . ketorolac  30 mg Intravenous 4 times per day  . lisinopril  10 mg Oral Daily  . metoprolol tartrate  25 mg Oral BID  . morphine  30 mg Oral Q12H  . mupirocin ointment  1 application Topical BID  . piperacillin-tazobactam (ZOSYN)  IV  3.375 g Intravenous 3 times per day  . potassium chloride SA  20 mEq Oral q morning - 10a  . senna-docusate  1 tablet Oral BID  . sodium chloride  3 mL Intravenous Q12H  . vancomycin  1,000 mg Intravenous Q8H   Continuous Infusions: . sodium chloride Stopped (09/10/14 2138)    Time spent 1 hour. More than 50% time spent in discussion. Counseling and coordination of care.

## 2014-09-12 ENCOUNTER — Inpatient Hospital Stay (HOSPITAL_COMMUNITY): Payer: Medicare Other

## 2014-09-12 DIAGNOSIS — D5700 Hb-SS disease with crisis, unspecified: Secondary | ICD-10-CM

## 2014-09-12 DIAGNOSIS — Z87891 Personal history of nicotine dependence: Secondary | ICD-10-CM

## 2014-09-12 DIAGNOSIS — J9601 Acute respiratory failure with hypoxia: Secondary | ICD-10-CM

## 2014-09-12 DIAGNOSIS — J9690 Respiratory failure, unspecified, unspecified whether with hypoxia or hypercapnia: Secondary | ICD-10-CM | POA: Insufficient documentation

## 2014-09-12 DIAGNOSIS — I2789 Other specified pulmonary heart diseases: Secondary | ICD-10-CM

## 2014-09-12 LAB — COMPREHENSIVE METABOLIC PANEL
ALK PHOS: 69 U/L (ref 38–126)
ALT: 22 U/L (ref 17–63)
ANION GAP: 9 (ref 5–15)
AST: 37 U/L (ref 15–41)
Albumin: 3.2 g/dL — ABNORMAL LOW (ref 3.5–5.0)
BILIRUBIN TOTAL: 3.9 mg/dL — AB (ref 0.3–1.2)
BUN: 11 mg/dL (ref 6–20)
CHLORIDE: 107 mmol/L (ref 101–111)
CO2: 22 mmol/L (ref 22–32)
Calcium: 8.6 mg/dL — ABNORMAL LOW (ref 8.9–10.3)
Creatinine, Ser: 1.04 mg/dL (ref 0.61–1.24)
GFR calc non Af Amer: >60 mL/min (ref >60)
GLUCOSE: 95 mg/dL (ref 65–99)
POTASSIUM: 3.9 mmol/L (ref 3.5–5.1)
SODIUM: 138 mmol/L (ref 135–145)
Total Protein: 7.2 g/dL (ref 6.5–8.1)

## 2014-09-12 LAB — CBC WITH DIFFERENTIAL/PLATELET
Basophils Absolute: 0.1 10*3/uL (ref 0.0–0.1)
Basophils Relative: 1 % (ref 0–1)
EOS PCT: 3 % (ref 0–5)
Eosinophils Absolute: 0.5 10*3/uL (ref 0.0–0.7)
HCT: 23 % — ABNORMAL LOW (ref 39.0–52.0)
Hemoglobin: 7.9 g/dL — ABNORMAL LOW (ref 13.0–17.0)
LYMPHS ABS: 2 10*3/uL (ref 0.7–4.0)
LYMPHS PCT: 12 % (ref 12–46)
MCH: 31.1 pg (ref 26.0–34.0)
MCHC: 34.3 g/dL (ref 30.0–36.0)
MCV: 90.6 fL (ref 78.0–100.0)
Monocytes Absolute: 1.5 10*3/uL — ABNORMAL HIGH (ref 0.1–1.0)
Monocytes Relative: 9 % (ref 3–12)
Neutro Abs: 12.3 10*3/uL — ABNORMAL HIGH (ref 1.7–7.7)
Neutrophils Relative %: 75 % (ref 43–77)
PLATELETS: 644 10*3/uL — AB (ref 150–400)
RBC: 2.54 MIL/uL — AB (ref 4.22–5.81)
RDW: 18.9 % — ABNORMAL HIGH (ref 11.5–15.5)
WBC: 16.4 10*3/uL — AB (ref 4.0–10.5)

## 2014-09-12 LAB — TYPE AND SCREEN
ABO/RH(D): O POS
Antibody Screen: NEGATIVE
UNIT DIVISION: 0
Unit division: 0

## 2014-09-12 LAB — HEMOGLOBINOPATHY EVALUATION
HGB A2 QUANT: 3.6 % — AB (ref 0.7–3.1)
HGB A: 50.5 % — AB (ref 94.0–98.0)
Hgb C: 0 %
Hgb F Quant: 4.4 % — ABNORMAL HIGH (ref 0.0–2.0)
Hgb S Quant: 41.5 % — ABNORMAL HIGH

## 2014-09-12 LAB — VANCOMYCIN, TROUGH: VANCOMYCIN TR: 24 ug/mL — AB (ref 10.0–20.0)

## 2014-09-12 MED ORDER — ENOXAPARIN SODIUM 100 MG/ML ~~LOC~~ SOLN
100.0000 mg | Freq: Once | SUBCUTANEOUS | Status: AC
  Administered 2014-09-12: 100 mg via SUBCUTANEOUS
  Filled 2014-09-12: qty 1

## 2014-09-12 MED ORDER — FUROSEMIDE 10 MG/ML IJ SOLN
40.0000 mg | Freq: Once | INTRAMUSCULAR | Status: AC
  Administered 2014-09-12: 40 mg via INTRAVENOUS
  Filled 2014-09-12: qty 4

## 2014-09-12 MED ORDER — ENOXAPARIN SODIUM 100 MG/ML ~~LOC~~ SOLN
100.0000 mg | SUBCUTANEOUS | Status: DC
  Administered 2014-09-13 – 2014-09-15 (×3): 100 mg via SUBCUTANEOUS
  Filled 2014-09-12 (×4): qty 1

## 2014-09-12 MED ORDER — VANCOMYCIN HCL IN DEXTROSE 1-5 GM/200ML-% IV SOLN
1000.0000 mg | Freq: Two times a day (BID) | INTRAVENOUS | Status: DC
Start: 2014-09-12 — End: 2014-09-15
  Administered 2014-09-12 – 2014-09-15 (×6): 1000 mg via INTRAVENOUS
  Filled 2014-09-12 (×9): qty 200

## 2014-09-12 MED ORDER — ENOXAPARIN SODIUM 100 MG/ML ~~LOC~~ SOLN
100.0000 mg | SUBCUTANEOUS | Status: DC
  Filled 2014-09-12: qty 1

## 2014-09-12 NOTE — Progress Notes (Signed)
Name: [name] MRN: [id number] DOB: 03-27-1980    ADMISSION DATE:  09/10/2014 CONSULTATION DATE:  09/12/2014  REFERRING MD :  Zigmund Daniel  CHIEF COMPLAINT:  Rib pain, SOB  BRIEF PATIENT DESCRIPTION: 34 y.o. M with known sickle cell disease, admitted 8/10 with sickle cell crisis.  Had increasing O2 demands so transferred to MICU and PCCM consulted for further recs.   SIGNIFICANT EVENTS  8/10 - Admitted with sickle cell crisis. 8/11- Transferred to [facility] to assess need for exchange transfusions.   STUDIES:  CTA chest 8/10 >>> chronic bilateral lowe lobe PE.  No new PE's seen.  Cardiac enlargement, pulmonary and osseous stigmata of sickle cell disease. CXR 8/12 >> CHF, volume overload  SUBJECTIVE:  Continues to have chest wall pain. Resp status much improved since transfer to [facility]. Now on 4 lt O2 with good sats.  ROS:  Patient denies any subjective fever, chills, or sweats. No abdominal pain or nausea.  VITAL SIGNS: Temp:  [97.7 F (36.5 C)-98.9 F (37.2 C)] 97.9 F (36.6 C) (08/12 0750) Pulse Rate:  [66-85] 85 (08/12 0400) Resp:  [12-20] 18 (08/12 0800) BP: (92-142)/(52-93) 102/71 mmHg (08/12 0400) SpO2:  [90 %-100 %] 99 % (08/12 0800) FiO2 (%):  [40 %-55 %] 45 % (08/12 0100)  PHYSICAL EXAMINATION: General: Male. No distress. somnolent HEENT: PERRL. No scleral injection.  Cardiovascular: Regular rate. No MRG Lungs: CTAB.  Abdomen: Soft. Nontender. Normal bowel sounds. Musculoskeletal: No joint deformity or effusion.    Recent Labs Lab 09/10/14 0553 09/11/14 0238 09/12/14 0830  NA 139 139 138  K 3.2* 4.0 3.9  CL 109 109 107  CO2 22 21* 22  BUN 14 10 11   CREATININE 0.81 0.91 1.04  GLUCOSE 115* 109* 95    Recent Labs Lab 09/09/14 1125 09/10/14 0553 09/11/14 0238  HGB 7.9* 7.3* 8.3*  HCT 23.6* 21.4* 23.6*  WBC 22.8* 21.9* 19.9*  PLT 753* 737* 715*   Dg Chest 1 View  09/10/2014   CLINICAL DATA:  Respiratory failure. Hypoxemia. Sickle cell patient.  EXAM:  CHEST  1 VIEW  COMPARISON:  Radiographs and CT earlier today.  FINDINGS: 1813 hour. Stable cardiomegaly. The left subclavian Port-A-Cath is unchanged at the SVC right atrial junction. Stable chronic vascular congestion and basilar scarring. No superimposed edema, confluent airspace opacity or significant pleural effusion. 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  IMPRESSION: Stable examination compared with earlier study today. Chronic cardiomegaly and chronic vascular congestion.   Electronically Signed   By: Richardean Sale M.D.   On: 09/10/2014 18:27   Dg Chest Port 1 View  09/11/2014   CLINICAL DATA:  Respiratory failure.  EXAM: PORTABLE CHEST - 1 VIEW  COMPARISON:  09/10/2014.  FINDINGS: PowerPort catheter noted in good anatomic position. Mediastinum and hilar structures normal. Cardiomegaly with mild pulmonary vascular prominence and interstitial prominence. Mild component of congestive heart failure cannot be excluded. No pleural effusion or pneumothorax.  IMPRESSION: 1. Power port catheter noted with tip in stable position.  2. Severe cardiomegaly again noted. Mild pulmonary vascular prominence interstitial prominence. Mild component of congestive heart failure cannot be excluded.   Electronically Signed   By: Marcello Moores  Register   On: 09/11/2014 07:14    ASSESSMENT / PLAN: 1)  Acute Hypoxic Respiratory Failure:  Discussed with Dr. Beryle Beams. Not thought to be acute chest syndrome. More likely a combination of volume overload, RV failure, possible HCAP.  Plan: - Initiate diuresis. Keep I/O negative alteast 1 lt.  2) ID. Possible HCAP  Plan: Vanco Start>> 8/10 Zosyn Start >> 8/10  Follow blood cultures and resp virus panel. Not able to make any sputum. Procalcitonin is negative. Will be able to narrow based on cultures. Check Legionella, & Urine Strep.  3) Renal Cr. Slightly elevated today.  Making good urine with lasix. Will monitor  4) CV Pulmonary Hypertension:   Multifactorial from PE & Sickle Cell Disease.  Likely in RV failure and volume overload Elevated troponins with ST changes on EKG. Suggestive of NSTEMI  Start diuresis. Stop heparin drip. Switch to lovenox anticoagulation Echocardiogram, follow troponins, cardiology consult. Continue lisinopril, lopressor.  5) Heme Continuing Hydrea, PCA pump for pain.  6) GI: Start PO feeds  7) Prophylaxis:   Therapeutic anticoagulation.   Pt is not critically ill.  Marshell Garfinkel MD [facility] Pager [phone] If no answer or after 3pm call: [phone] 09/12/2014, 12:43 PM

## 2014-09-12 NOTE — Progress Notes (Signed)
Advanced Heart Failure Consult Note  Primary Cardiologist:  None  Referring MD: Gwenlyn Found  Reason for consult:  Pulmonary hypertension  Subjective:    Advanced Heart failure Consult  34 y.o. male w/ PMH significant for sickle cell disease, hx of PE on lovenox, pulmonary HTN,  . He was in the hospital 8/8- 09/09/14 with pain and readmitted 8/10 with pain and hypoxia in regards to sickle cell crisis. Troponin values peaked at 7.76 on 09/10/14. Cardiology further consulted to optimize medical management for both right ventricular and left ventricular damage and probable acute non-ST-T wave elevation MI. HF team consulted for further input on optimal PAH medications.  He says since he has had problems with his breathing since he first got PEs, which he thinks was last year.  He has been on lovenox since then.   In his usual state of health he can walk for 3-4 blocks before becoming short of breath if he takes his time.  He does have a pulse-oximeter at home and checks his sats and they are often in the low 80s. Hurrying, or climbing a hill/steps makes him SOB.  He denies tachypalpitations, lightheadedness. Occasional near syncope.  He has sickle cell crisis at least once a month, usually requiring admission. Doesn't smoke cigarettes, but admits to marijuana.  Has never had any type of cancer or chemotherapy.     ECHO 08/25/14 EF 10-17% Normal Diastolic function.   PA peak pressure 59 mm Hg RV severely dilated  CT showed small peripheral pulmonary emboli. These were felt to be chronic.    Objective:   Weight Range: 147 lb 0.8 oz (66.7 kg) Body mass index is 19.94 kg/(m^2).   Vital Signs:   Temp:  [97.8 F (36.6 C)-98.9 F (37.2 C)] 98 F (36.7 C) (08/12 1108) Pulse Rate:  [66-116] 82 (08/12 1200) Resp:  [12-20] 15 (08/12 1200) BP: (92-142)/(52-93) 105/73 mmHg (08/12 1200) SpO2:  [90 %-100 %] 100 % (08/12 1200) FiO2 (%):  [40 %-55 %] 45 % (08/12 0100) Last BM Date: 09/10/14  Weight  change: Filed Weights   09/10/14 0416 09/10/14 1145 09/11/14 0600  Weight: 140 lb (63.504 kg) 148 lb 13 oz (67.5 kg) 147 lb 0.8 oz (66.7 kg)    Intake/Output:   Intake/Output Summary (Last 24 hours) at 09/12/14 1313 Last data filed at 09/12/14 1100  Gross per 24 hour  Intake 3638.83 ml  Output   2350 ml  Net 1288.83 ml     Physical Exam: General: Pleasant, NAD. Neuro: Alert and oriented X 3. Moves all extremities spontaneously. Psych: Normal affect. HEENT: Normal Neck: Supple without bruits or JVD. Lungs: Resp regular and unlabored, CTA. Heart: RRR no s3, s4, or murmurs appreciated. Loud P2 Abdomen: Soft, non-tender, non-distended, BS + x 4.  Extremities: No clubbing, cyanosis or edema. DP/PT/Radials 2+ and equal bilaterally.   Telemetry: NSR 60-80s. Occasional PVCs  Labs: CBC  Recent Labs  09/11/14 0238 09/12/14 0830  WBC 19.9* 16.4*  NEUTROABS 15.0* 12.3*  HGB 8.3* 7.9*  HCT 23.6* 23.0*  MCV 92.2 90.6  PLT 715* 510*   Basic Metabolic Panel  Recent Labs  09/10/14 0645 09/11/14 0238 09/12/14 0830  NA  --  139 138  K  --  4.0 3.9  CL  --  109 107  CO2  --  21* 22  GLUCOSE  --  109* 95  BUN  --  10 11  CALCIUM  --  9.0 8.6*  MG 1.6*  --   --  Liver Function Tests  Recent Labs  09/10/14 0553 09/12/14 0830  AST 41 37  ALT 27 22  ALKPHOS 67 69  BILITOT 3.7* 3.9*  PROT 7.9 7.2  ALBUMIN 3.8 3.2*   No results for input(s): LIPASE, AMYLASE in the last 72 hours. Cardiac Enzymes  Recent Labs  09/10/14 0957 09/10/14 1600 09/11/14 0238  TROPONINI 6.34* 7.76* 5.79*    BNP: BNP (last 3 results)  Recent Labs  08/29/14 0810 09/10/14 0612  BNP 1117.0* 549.5*    ProBNP (last 3 results)  Recent Labs  01/01/14 0909  PROBNP 5790.0*     D-Dimer  Recent Labs  09/10/14 0612  DDIMER 2.48*   Hemoglobin A1C No results for input(s): HGBA1C in the last 72 hours. Fasting Lipid Panel No results for input(s): CHOL, HDL,  LDLCALC, TRIG, CHOLHDL, LDLDIRECT in the last 72 hours. Thyroid Function Tests No results for input(s): TSH, T4TOTAL, T3FREE, THYROIDAB in the last 72 hours.  Invalid input(s): FREET3  Other results:     Imaging/Studies:  Dg Chest 1 View  09/10/2014   CLINICAL DATA:  Respiratory failure. Hypoxemia. Sickle cell patient.  EXAM: CHEST  1 VIEW  COMPARISON:  Radiographs and CT earlier today.  FINDINGS: 1813 hour. Stable cardiomegaly. The left subclavian Port-A-Cath is unchanged at the SVC right atrial junction. Stable chronic vascular congestion and basilar scarring. No superimposed edema, confluent airspace opacity or significant pleural effusion. 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  IMPRESSION: Stable examination compared with earlier study today. Chronic cardiomegaly and chronic vascular congestion.   Electronically Signed   By: Richardean Sale M.D.   On: 09/10/2014 18:27   Dg Chest Port 1 View  09/12/2014   CLINICAL DATA:  Respiratory failure.  EXAM: PORTABLE CHEST - 1 VIEW  COMPARISON:  Chest radiograph from 1 day prior.  FINDINGS: Left subclavian central venous catheter terminates near the cavoatrial junction. Stable cardiomediastinal silhouette with mild to moderate cardiomegaly. No pneumothorax. No pleural effusion. There is stable mild to moderate pulmonary edema. No new focal lung opacity. Stable mild scarring versus atelectasis in the mid to lower left lung.  IMPRESSION: 1. Stable mild to moderate congestive heart failure. 2. Stable mild scarring versus atelectasis in the basilar left lung.   Electronically Signed   By: Ilona Sorrel M.D.   On: 09/12/2014 09:32   Dg Chest Port 1 View  09/11/2014   CLINICAL DATA:  Respiratory failure.  EXAM: PORTABLE CHEST - 1 VIEW  COMPARISON:  09/10/2014.  FINDINGS: PowerPort catheter noted in good anatomic position. Mediastinum and hilar structures normal. Cardiomegaly with mild pulmonary vascular prominence and interstitial  prominence. Mild component of congestive heart failure cannot be excluded. No pleural effusion or pneumothorax.  IMPRESSION: 1. Power port catheter noted with tip in stable position.  2. Severe cardiomegaly again noted. Mild pulmonary vascular prominence interstitial prominence. Mild component of congestive heart failure cannot be excluded.   Electronically Signed   By: Marcello Moores  Register   On: 09/11/2014 07:14     Latest Echo  Latest Cath   Medications:     Scheduled Medications: . antiseptic oral rinse  7 mL Mouth Rinse q12n4p  . aspirin  81 mg Oral Daily  . chlorhexidine  15 mL Mouth Rinse BID  . cholecalciferol  2,000 Units Oral Daily  . diltiazem  120 mg Oral Daily  . [START ON 09/13/2014] enoxaparin (LOVENOX) injection  100 mg Subcutaneous Q24H  . folic acid  1 mg Oral q morning -  10a  . HYDROmorphone PCA 2 mg/mL   Intravenous 6 times per day  . hydroxyurea  1,500 mg Oral Daily  . ketorolac  30 mg Intravenous 4 times per day  . lisinopril  10 mg Oral Daily  . metoprolol tartrate  25 mg Oral BID  . morphine  30 mg Oral Q12H  . mupirocin ointment  1 application Topical BID  . piperacillin-tazobactam (ZOSYN)  IV  3.375 g Intravenous 3 times per day  . potassium chloride SA  20 mEq Oral q morning - 10a  . senna-docusate  1 tablet Oral BID  . sodium chloride  3 mL Intravenous Q12H  . vancomycin  1,000 mg Intravenous Q8H     Infusions: . sodium chloride 100 mL/hr at 09/12/14 1208     PRN Medications:  diphenhydrAMINE (BENADRYL) IVPB(SICKLE CELL ONLY), naloxone **AND** sodium chloride, ondansetron (ZOFRAN) IV, polyethylene glycol, sodium chloride, zolpidem   Assessment/Plan   1. Pulmonary Hypertension 2. Sickle Cell disease 3. Hx of pulmonary emboli with chronic PE - on long term coag. 4. HTN 5. Asplenia   Will order VQ scan, hepatitis panel, and HIV antibody.  Will discuss optimization of medications and options for treatment with MD. Correction of hypoxia primary  concern at this time.  Length of Stay: 2   Shirley Friar PA-C 09/12/2014, 1:13 PM  Advanced Heart Failure Team Pager [phone] (M-F; 7a - 4p)  Please contact [facility] for night-coverage after hours (4p -7a ) and weekends on [email or web address]  Patient seen and examined with Oda Kilts, PA-C. We discussed all aspects of the encounter. I agree with the assessment and plan as stated above.   Mr. Demetrio Lapping has moderate pulmonary HTN with right heart strain by echo in the setting of SCD and recent pulmonary emboli for which he has been treated with enoxaparin. Pulmonary HTN in patients with SCD is typically multifactorial including PE, chronic hypoxia and high output state from anemia. OSA can also play a role but he denies snoring.   We will get VQ scan to quantify degree of VQ mismatching. If large degree of mismatching would have to consider thromboembolectomy but that would be high risk for him. Will also check LE u/s, HIV and hepatitis panels. I stressed to him the need to make sure he is adequately oxygenated (sat >= 90%) at all time to avoid hypoxic vasoconstriction and worsening PH. Would suggest walking him prior to d/c to assess his O2 requirement. If PH persists we can consider right heart cath down the road but would want fix the correctable problems first prior to reassessing with echo or cath. Endothelian receptor antagonists can play a role in therapy but would avoid PDE-5 analogues (sildneafil, tadalfil, etc) as they can precipitate vaso-occlusive crises. Continue hydroxyurea.  He can follow with me as an outpatient.   Bensimhon, Daniel,MD 2:01 PM

## 2014-09-12 NOTE — Consult Note (Signed)
Referring MD: Dr. Liston Alba  PCP:  [name], MD   Reason for Referral: Evaluate for red cell exchange. Rule out acute chest syndrome   Chief Complaint  Patient presents with  . Sickle Cell Pain Crisis    HPI:   34 year old man with severe sickle cell disease requiring hospitalization on more than a monthly basis. He has developed pulmonary hypertension secondary to repetitive sickle crises. Most recent echocardiogram done 08/25/2014 showed left ventricular function preserved with ejection fraction 55-65 percent. However, there was severe dilation of the right ventricle with mildly reduced systolic function, severe dilatation of the right atrium, and moderate to severe tricuspid regurgitation. Elevated pulmonary artery pressure 59 mm. Pulmonary artery dilated on CT angiogram done 07/20/2014. That study also residual, but not new,small to moderate, bilateral, lower lobe, pulmonary emboli.  He was initially diagnosed with pulmonary emboli on a CT angiogram done 03/01/2012. These resolved on a follow-up study 03/26/2013. Recurrent pulmonary emboli on a 03/11/2014 study, persistent on a 07/20/2014 study, with stable residual on a July 29Ann August 10 study.  He has had multiple recent hospital admissions for sickle crisis most recently on August 8 just 5 days after his most recent admission with multiple recent admissions on 5/25, 6/6, 6-/3, 6-/9, 6/29, 7/5, 7/14, 7/18, 7/24. Symptoms were typical of previous crises with generalized pain in his chest, back, and legs. He is on intermittent home oxygen at 3 L. On initial exam he was in no respiratory distress but it did have baseline dyspnea. Respiratory rate recorded at 22/m with oxygen saturation 91%. He was afebrile. Lungs were clear. Chest radiograph done on August 10 showed chronic cardiomegaly, minimal bibasilar atelectasis, and mild vascular congestion. Left-sided Port-A-Cath infusion device noted. He develop increasing hypoxia  over the course of the day on August 10 and by August 11 was on a non-rebreather oxygen mask to maintain oxygenation. CT angiogram of the chest on August 10 showed no acute change with stable cardiomegaly, stable chronic vascular congestion, and bibasilar scarring. No pulmonary infiltrates or effusion and residual, bibasilar, pulmonary emboli.  Baseline hematologic parameters show hemoglobin chronically around 7 g, reticulocyte count chronically elevated at 10-20 percent, white blood count chronically elevated at 15-20000, LDH range 300-500, bilirubin 3-5 milligrams percent. On August 9: Hemoglobin 7.9, white count 22,800, platelets 753,000, reticulocyte count 18%, bilirubin 3.7, LDH 525. Acute rise in troponin from 0.47 recorded on August 1 to 3.48 recorded on August 10. Subsequent value 6.34 then peaked at 7.76 Lactic acid 0.56. EKG showed progressive changes  consistent with an age indeterminate lateral, inferior, and posterior infarct. Some of these changes were old seen on a previous tracing in July in particular the Q waves in leads 2, 3 and F. However, T-wave inversions in leads V4 ,5 and 6  appear new.     Past Medical History  Diagnosis Date  . Sickle cell anemia   . Blood transfusion   . Acute embolism and thrombosis of right internal jugular vein   . Hypokalemia   . Mood disorder   . History of pulmonary embolus (PE)   . Avascular necrosis   . Leukocytosis     Chronic  . Thrombocytosis     Chronic  . Hypertension   . History of Clostridium difficile infection   . Uses marijuana   . Chronic anticoagulation   . Functional asplenia   . Former smoker   . Second hand tobacco smoke exposure   . Alcohol consumption of one to four drinks  per day   . Noncompliance with medication regimen   . Sickle-cell crisis with associated acute chest syndrome 05/13/2013  . Acute chest syndrome 06/18/2013  . Demand ischemia 01/02/2014  :  Past Surgical History  Procedure Laterality Date  .  Right hip replacement      08/2006  . Cholecystectomy      01/2008  . Porta cath placement    . Porta cath removal    . Umbilical hernia repair      01/2008  . Excision of left periauricular cyst      10/2009  . Excision of right ear lobe cyst with primary closur      11/2007  . Portacath placement  01/05/2012    Procedure: INSERTION PORT-A-CATH;  Surgeon: Odis Hollingshead, MD;  Location: [facility];  Service: General;  Laterality: N/A;  ultrasound guiced port a cath insertion with fluoroscopy  :  . antiseptic oral rinse  7 mL Mouth Rinse q12n4p  . aspirin  81 mg Oral Daily  . chlorhexidine  15 mL Mouth Rinse BID  . cholecalciferol  2,000 Units Oral Daily  . diltiazem  120 mg Oral Daily  . folic acid  1 mg Oral q morning - 10a  . HYDROmorphone PCA 2 mg/mL   Intravenous 6 times per day  . hydroxyurea  1,500 mg Oral Daily  . ketorolac  30 mg Intravenous 4 times per day  . lisinopril  10 mg Oral Daily  . metoprolol tartrate  25 mg Oral BID  . morphine  30 mg Oral Q12H  . mupirocin ointment  1 application Topical BID  . piperacillin-tazobactam (ZOSYN)  IV  3.375 g Intravenous 3 times per day  . potassium chloride SA  20 mEq Oral q morning - 10a  . senna-docusate  1 tablet Oral BID  . sodium chloride  3 mL Intravenous Q12H  . vancomycin  1,000 mg Intravenous Q8H  :  No Known Allergies:  Family History  Problem Relation Age of Onset  . Sickle cell trait Mother   . Depression Mother   . Diabetes Mother   . Sickle cell trait Father   . Sickle cell trait Brother   :  Social History   Social History  . Marital Status: Single    Spouse Name: N/A  . Number of Children: 0  . Years of Education: 13   Occupational History  . 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 Smoking status: Former Smoker -- 13 years    Quit date: 07/08/2010  . Smokeless tobacco: Never Used  . Alcohol Use: No  . Drug Use: 2.00 per week    Special:  Marijuana  . Sexual Activity:    Partners: Female    Museum/gallery curator: None     Comment: month ago   Other Topics Concern  . Not on file   Social History Narrative   Lives in an apartment.  Single.  Lives alone but has a girlfriend that helps care for him.  Does not use any assist devices.        [name]:  [phone] Mom, emergency contact  :  ROS: See history of present illness Vitals: Filed Vitals:   09/12/14 0800  BP:   Pulse:   Temp:   Resp: 18    PHYSICAL EXAM: Blood pressure 102/71, pulse 85, temperature 97.9 F (36.6 C), temperature source Oral, resp. rate  18, height 6' (1.829 m), weight 147 lb 0.8 oz (66.7 kg), SpO2 99 %./ on 6 liters  FiO2 nasal canula General appearance: Young African-American man who appears well-nourished and in no acute respiratory distress HEENT: Neck full range of motion. Oropharynx without erythema or exudate. No oropharyngeal mass. No ulcerations. Lymph Nodes: No cervical, supraclavicular, or axillary adenopathy Resp: Lungs are clear to auscultation and resonant to percussion throughout Cardio: Regular cardiac rhythm no murmur gallop or rub Vascular: Carotids 2+ no bruits Breasts: GI: Soft, nontender, no mass, no organomegaly GU: Condom cath Extremities: No edema, no calf tenderness Neurologic:  He is alert and oriented, PERRLA, cranial nerves grossly normal, motor strength 5 over 5, reflexes 2+ symmetric at the biceps and at the knees Skin: no rash or ecchymosis  Labs:   Recent Labs  09/10/14 0553 09/11/14 0238  WBC 21.9* 19.9*  HGB 7.3* 8.3*  HCT 21.4* 23.6*  PLT 737* 715*    Recent Labs  09/10/14 0553 09/11/14 0238  NA 139 139  K 3.2* 4.0  CL 109 109  CO2 22 21*  GLUCOSE 115* 109*  BUN 14 10  CREATININE 0.81 0.91  CALCIUM 8.9 9.0     Images Studies/Results:  Dg Chest 1 View  09/10/2014   CLINICAL DATA:  Respiratory failure. Hypoxemia. Sickle cell patient.  EXAM: CHEST  1 VIEW  COMPARISON:   Radiographs and CT earlier today.  FINDINGS: 1813 hour. Stable cardiomegaly. The left subclavian Port-A-Cath is unchanged at the SVC right atrial junction. Stable chronic vascular congestion and basilar scarring. No superimposed edema, confluent airspace opacity or significant pleural effusion. 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  IMPRESSION: Stable examination compared with earlier study today. Chronic cardiomegaly and chronic vascular congestion.   Electronically Signed   By: Richardean Sale M.D.   On: 09/10/2014 18:27   Ct Angio Chest Pe W/cm &/or Wo Cm  09/10/2014   CLINICAL DATA:  Short of breath.  EXAM: CT ANGIOGRAPHY CHEST WITH CONTRAST  TECHNIQUE: Multidetector CT imaging of the chest was performed using the standard protocol during bolus administration of intravenous contrast. Multiplanar CT image reconstructions and MIPs were obtained to evaluate the vascular anatomy.  CONTRAST:  156mL OMNIPAQUE IOHEXOL 350 MG/ML SOLN  COMPARISON:  08/29/2014  FINDINGS: Mediastinum: There is moderate cardiac enlargement. No pericardial effusion. The trachea is patent and appears midline. Normal appearance of the esophagus. Prominent soft tissue is identified within the anterior mediastinum. Stress set prominent anterior mediastinal adenopathy is again identified. Index pre-vascular lymph node measures 1 cm, image 35/series 4. Previously 1.2 cm. Bilateral lower lobe pulmonary artery filling defects are again noted and are in the same distribution as the previous exam. No new pulmonary emboli identified.  Lungs/Pleura: There is no pleural effusion. Mosaic attenuation pattern is identified throughout both lungs compatible with pulmonary emboli. No airspace consolidation. Scarring is noted predominantly involving the lower lobes.  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 Diffuse increased attenuation throughout the liver is noted. The visualized adrenal  glands are unremarkable.  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  Review of the MIP images confirms the above findings.  IMPRESSION: 1. Again noted are chronic bilateral lower lobe pulmonary emboli. No new areas of pulmonary emboli identified. 2. Cardiac enlargement 3. Pulmonary and osseous stigmata of sickle cell disease.   Electronically Signed   By: Kerby Moors M.D.   On: 09/10/2014  09:00   Dg Chest Port 1 View  09/11/2014   CLINICAL DATA:  Respiratory failure.  EXAM: PORTABLE CHEST - 1 VIEW  COMPARISON:  09/10/2014.  FINDINGS: PowerPort catheter noted in good anatomic position. Mediastinum and hilar structures normal. Cardiomegaly with mild pulmonary vascular prominence and interstitial prominence. Mild component of congestive heart failure cannot be excluded. No pleural effusion or pneumothorax.  IMPRESSION: 1. Power port catheter noted with tip in stable position.  2. Severe cardiomegaly again noted. Mild pulmonary vascular prominence interstitial prominence. Mild component of congestive heart failure cannot be excluded.   Electronically Signed   By: Marcello Moores  Register   On: 09/11/2014 07:14        Assessment: Principal Problem:   Hb-SS disease with crisis Active Problems:   Hx of pulmonary embolus   Functional asplenia   Pulmonary HTN   Chronic anticoagulation   Essential hypertension   Chronic pain syndrome   Hypoxia   Anemia of chronic disease   Cor pulmonale, chronic   Elevated troponin I level   PVC's (premature ventricular contractions)   Abnormal EKG   Hypoxemia   Acute respiratory failure with hypoxia   Acute chest syndrome in sickle crisis  Impression: Hypoxia related to acute and chronic cardiopulmonary disease with recent  recurrent pulmonary emboli and progression of right heart failure secondary to pulmonary hypertension from repetitive sickle crises. I suspect  that he had an acute, non-ST-T wave elevation MI leading to further myocardial damage and acute cardiorespiratory deterioration.   He does not meet  criteria for acute chest syndrome. With respect to parameters of hemolysis, there is no significant change above his baseline to suggest a active, progressive, hemolytic crisis.  He has not developed any pulmonary infiltrates either on chest x-ray or CT scan done on August 10. He has minimal chest pain at this point. Respiratory status has improved over the last 24 hours. He is breathing comfortably on 6 L of nasal cannula oxygen and oxygenating well at this time.    Recommendation: No indication for Red cell exchange transfusion at this time. I discussed with Dr. Zigmund Daniel yesterday. I recommend simple transfusion alone at this time. Target hemoglobin 10 but no higher to avoid increased serum viscosity. I would be happy to reevaluate situation in his condition deteriorates. Unfortunately, he is only 34 years old and already has advanced cardiopulmonary failure. I would consider a cardiology consultation to see if we can optimize medical management with evidence for both right ventricular and left ventricular damage and probable acute non-ST-T wave elevation MI.    Alyson Locket [facility] 09/12/2014, 8:23 AM

## 2014-09-12 NOTE — Progress Notes (Signed)
ANTICOAGULATION & ANTIBIOTIC CONSULT NOTE - Follow Up Consult  Pharmacy Consult:  Heparin >> Lovenox + Vancomycin/Zosyn Indication:  Chronic pulmonary embolus + possible HCAP  No Known Allergies  Patient Measurements: Height: 6' (182.9 cm) Weight: 147 lb 0.8 oz (66.7 kg) IBW/kg (Calculated) : 77.6 Heparin Dosing Weight: 67 kg  Vital Signs: Temp: 98.7 F (37.1 C) (08/12 0404) Temp Source: Oral (08/12 0404) BP: 102/71 mmHg (08/12 0400) Pulse Rate: 85 (08/12 0400)  Labs:  Recent Labs  09/09/14 1125 09/10/14 0553  09/10/14 0957 09/10/14 1600 09/11/14 0238 09/11/14 2115  HGB 7.9* 7.3*  --   --   --  8.3*  --   HCT 23.6* 21.4*  --   --   --  23.6*  --   PLT 753* 737*  --   --   --  715*  --   HEPARINUNFRC  --   --   --   --   --   --  0.43  CREATININE  --  0.81  --   --   --  0.91  --   TROPONINI  --   --   < > 6.34* 7.76* 5.79*  --   < > = values in this interval not displayed.  Estimated Creatinine Clearance: 106.9 mL/min (by C-G formula based on Cr of 0.91).     Assessment: 34 YOM admitted to [facility] on 09/10/14 with bilateral rib pain associated with sickle cell crisis.  Patient transferred to [facility] on 09/11/14 for possible PRBC exchange.  He has a history of chronic pulmonary embolus on Lovenox PTA.  Patient was transitioned to IV heparin in anticipation of line placement.  Now to change back to Lovenox.  Pharmacy also managing vancomycin and Zosyn for possible HCAP.  Patient's renal function is worsening but UOP remains good.  Vancomycin trough is supra-therapeutic.  Vanc 8/10 >> Zosyn 8/10 >>  8/12 VT = 24 mcg/mL on 1g q8 >> 1g q12 (SCr 1.04)  8/11 RVP - 8/1 BCx x2 -   Goal of Therapy:  Anti-Xa level:  1-2 units/mL Monitor platelets by anticoagulation protocol: Yes  Vanc trough 15-20 mcg/mL    Plan:  - Lovenox 100mg  SQ Q24H (1.5mg /kg/day), start 1 hours post heparin stopped - CBC Q72H while on Lovenox - Change vanc to 1gm IV Q12H - Zosyn 3.375gm IV  Q8H, 4 hr infusion - Monitor renal fxn, clinical progress, vanc trough soon   Emmani Lesueur D. Mina Marble, PharmD, BCPS Pager:  [phone] 09/12/2014, 2:56 PM

## 2014-09-12 NOTE — Progress Notes (Signed)
Patient Name: [name] Date of Encounter: 09/12/2014  Principal Problem:   Hb-SS disease with crisis Active Problems:   Hx of pulmonary embolus   Functional asplenia   Pulmonary HTN   Chronic anticoagulation   Essential hypertension   Chronic pain syndrome   Hypoxia   Anemia of chronic disease   Cor pulmonale, chronic   Elevated troponin I level   PVC's (premature ventricular contractions)   Abnormal EKG   Hypoxemia   Acute respiratory failure with hypoxia   Acute chest syndrome in sickle crisis   Respiratory failure   Primary Cardiologist: Dr. [name] Spanish Patient Profile: 34 y.o. male w/ PMH significant for sickle cell disease. He was in the hospital 8/8- 09/09/14 with pain and readmitted 8/10 with pain and hypoxia in regards to sickle cell crisis. Troponin values peaked at 7.76 on 09/10/14. Cardiology further consulted to optimize medical management for both right ventricular and left ventricular damage and probable acute non-ST-T wave elevation MI.  SUBJECTIVE: Has not experienced any chest pain. Reports occasional palpitations with activity but denies any in the past 24 hours. Denies any lower extremity edema. Still have shortness of breath with exertion.  OBJECTIVE Filed Vitals:   09/12/14 0400 09/12/14 0404 09/12/14 0750 09/12/14 0800  BP: 102/71     Pulse: 85     Temp:  98.7 F (37.1 C) 97.9 F (36.6 C)   TempSrc:  Oral Oral   Resp: 19   18  Height:      Weight:      SpO2: 97%   99%    Intake/Output Summary (Last 24 hours) at 09/12/14 1039 Last data filed at 09/12/14 0500  Gross per 24 hour  Intake 3340.83 ml  Output   1250 ml  Net 2090.83 ml   Filed Weights   09/10/14 0416 09/10/14 1145 09/11/14 0600  Weight: 140 lb (63.504 kg) 148 lb 13 oz (67.5 kg) 147 lb 0.8 oz (66.7 kg)    PHYSICAL EXAM General: Well developed, well nourished, male in no acute distress. Head: Normocephalic, atraumatic.  Neck: Supple without bruits, JVD not  elevated. Lungs:  Resp regular and unlabored, CTA without wheezing or rales. Heart: RRR, S1, S2, no S3, S4, or murmur; no rub. Abdomen: Soft, non-tender, non-distended, BS + x 4.  Extremities: No clubbing, no cyanosis, no edema. Distal pulses 2+ bilaterally.  Neuro: Alert and oriented X 3. Moves all extremities spontaneously. Psych: Normal affect.  LABS: CBC: Recent Labs  09/10/14 0553 09/11/14 0238  WBC 21.9* 19.9*  NEUTROABS 18.2* 15.0*  HGB 7.3* 8.3*  HCT 21.4* 23.6*  MCV 93.0 92.2  PLT 737* 715*   INR:No results for input(s): INR in the last 72 hours. Basic Metabolic Panel: Recent Labs  09/10/14 0645 09/11/14 0238 09/12/14 0830  NA  --  139 138  K  --  4.0 3.9  CL  --  109 107  CO2  --  21* 22  GLUCOSE  --  109* 95  BUN  --  10 11  CREATININE  --  0.91 1.04  CALCIUM  --  9.0 8.6*  MG 1.6*  --   --    Liver Function Tests: Recent Labs  09/10/14 0553 09/12/14 0830  AST 41 37  ALT 27 22  ALKPHOS 67 69  BILITOT 3.7* 3.9*  PROT 7.9 7.2  ALBUMIN 3.8 3.2*   Cardiac Enzymes: Recent Labs  09/10/14 0957 09/10/14 1600 09/11/14 0238  TROPONINI 6.34* 7.76* 5.79*  BNP:  B NATRIURETIC PEPTIDE  Date/Time Value Ref Range Status  09/10/2014 06:12 AM 549.5* 0.0 - 100.0 pg/mL Final  08/29/2014 08:10 AM 1117.0* 0.0 - 100.0 pg/mL Final   D-dimer: Recent Labs  09/10/14 0612  DDIMER 2.48*   Anemia Panel: Recent Labs  09/10/14 1600  RETICCTPCT 18.8*    TELE: Sinus rhythm with rate in 60's -80's. Occasional PVC's.       Radiology/Studies: Dg Chest Port 1 View: 09/12/2014   CLINICAL DATA:  Respiratory failure.  EXAM: PORTABLE CHEST - 1 VIEW  COMPARISON:  Chest radiograph from 1 day prior.  FINDINGS: Left subclavian central venous catheter terminates near the cavoatrial junction. Stable cardiomediastinal silhouette with mild to moderate cardiomegaly. No pneumothorax. No pleural effusion. There is stable mild to moderate pulmonary edema. No new focal lung  opacity. Stable mild scarring versus atelectasis in the mid to lower left lung.  IMPRESSION: 1. Stable mild to moderate congestive heart failure. 2. Stable mild scarring versus atelectasis in the basilar left lung.   Electronically Signed   By: Ilona Sorrel M.D.   On: 09/12/2014 09:32     Current Medications:  . antiseptic oral rinse  7 mL Mouth Rinse q12n4p  . aspirin  81 mg Oral Daily  . chlorhexidine  15 mL Mouth Rinse BID  . cholecalciferol  2,000 Units Oral Daily  . diltiazem  120 mg Oral Daily  . [START ON 09/13/2014] enoxaparin (LOVENOX) injection  100 mg Subcutaneous Q24H  . enoxaparin (LOVENOX) injection  100 mg Subcutaneous Once  . folic acid  1 mg Oral q morning - 10a  . HYDROmorphone PCA 2 mg/mL   Intravenous 6 times per day  . hydroxyurea  1,500 mg Oral Daily  . ketorolac  30 mg Intravenous 4 times per day  . lisinopril  10 mg Oral Daily  . metoprolol tartrate  25 mg Oral BID  . morphine  30 mg Oral Q12H  . mupirocin ointment  1 application Topical BID  . piperacillin-tazobactam (ZOSYN)  IV  3.375 g Intravenous 3 times per day  . potassium chloride SA  20 mEq Oral q morning - 10a  . senna-docusate  1 tablet Oral BID  . sodium chloride  3 mL Intravenous Q12H  . vancomycin  1,000 mg Intravenous Q8H   . sodium chloride 100 mL/hr at 09/12/14 0039    ASSESSMENT AND PLAN:  1. Elevated Troponin - troponin peaked on 09/10/14 at 7.76. Has been elevated since Dec.2015 - Continue ASA - Per previous notes, patient is likely not a candidate for cath/PCI, therefore Myoview would be of limited benefit.  2. Pulmonary HTN - Echo on 08/25/14 showed PA Pressure of 59. EF 55-65%. Wall thickness of left ventricle was increased in a pattern of mild LVH. Right ventricle severely dilated at that time. BNP 549 on 09/10/14. Had previously been 1117 on 08/29/14. - Currently on Cardizem CD 120mg  daily, Lisinopril 10mg  daily, Lopressor 25mg  BID. BP has been 92/52 - 142/93 over the past 24 hours, so  would be hesitant to adjust his medications at this time. - Consider outpatient right heart cath once he recovers from acute sickle cell crisis. Consider CHF consult for now to assess different options for Pulm HTN management.  Signed, Dineen Kid , PA-C 10:39 AM 09/12/2014   Agree with note by Dineen Kid PA-C  Pt transferred from [facility] where I saw him. + Trop similar to past presentations. Diffuse EKG changes. SSD with H/O PE and RV dilatation, hypocontractility  with moderately severe PHTN. Recent CTA did not show recurrent new PE. I would not change meds at this time. Will get input from CHF service to see if they have any suggestions for additional meds for PHTN.   Lorretta Harp, M.D., [place], [facility], [place] [address] [place], [place]  [address]  [phone] 09/12/2014 11:42 AM

## 2014-09-12 NOTE — Progress Notes (Signed)
Patient ID: [name], male   DOB: 1980-04-25, 34 y.o.   MRN: [id number] See full consult note.

## 2014-09-13 ENCOUNTER — Encounter (HOSPITAL_COMMUNITY): Payer: Self-pay

## 2014-09-13 LAB — ALBUMIN: Albumin: 3.3 g/dL — ABNORMAL LOW (ref 3.5–5.0)

## 2014-09-13 LAB — HEPATITIS PANEL, ACUTE
HCV Ab: <0.1 s/co ratio (ref 0.0–0.9)
HEP A IGM: NEGATIVE
HEP B C IGM: NEGATIVE
HEP B S AG: NEGATIVE

## 2014-09-13 LAB — RESPIRATORY VIRUS PANEL
Adenovirus: NEGATIVE
INFLUENZA A: NEGATIVE
Influenza B: NEGATIVE
Metapneumovirus: NEGATIVE
PARAINFLUENZA 1 A: NEGATIVE
Parainfluenza 2: NEGATIVE
Parainfluenza 3: NEGATIVE
RESPIRATORY SYNCYTIAL VIRUS B: NEGATIVE
RHINOVIRUS: NEGATIVE
Respiratory Syncytial Virus A: NEGATIVE

## 2014-09-13 LAB — CBC WITH DIFFERENTIAL/PLATELET
BASOS PCT: 1 % (ref 0–1)
Basophils Absolute: 0.1 10*3/uL (ref 0.0–0.1)
Eosinophils Absolute: 0.6 10*3/uL (ref 0.0–0.7)
Eosinophils Relative: 5 % (ref 0–5)
HCT: 23.5 % — ABNORMAL LOW (ref 39.0–52.0)
Hemoglobin: 8 g/dL — ABNORMAL LOW (ref 13.0–17.0)
Lymphocytes Relative: 18 % (ref 12–46)
Lymphs Abs: 2.6 10*3/uL (ref 0.7–4.0)
MCH: 31.7 pg (ref 26.0–34.0)
MCHC: 34 g/dL (ref 30.0–36.0)
MCV: 93.3 fL (ref 78.0–100.0)
MONO ABS: 1.6 10*3/uL — AB (ref 0.1–1.0)
MONOS PCT: 12 % (ref 3–12)
Neutro Abs: 9.1 10*3/uL — ABNORMAL HIGH (ref 1.7–7.7)
Neutrophils Relative %: 65 % (ref 43–77)
Platelets: 655 10*3/uL — ABNORMAL HIGH (ref 150–400)
RBC: 2.52 MIL/uL — AB (ref 4.22–5.81)
RDW: 19.1 % — AB (ref 11.5–15.5)
WBC: 14 10*3/uL — ABNORMAL HIGH (ref 4.0–10.5)

## 2014-09-13 LAB — PROCALCITONIN: Procalcitonin: <0.1 ng/mL

## 2014-09-13 LAB — BASIC METABOLIC PANEL
Anion gap: 5 (ref 5–15)
BUN: 14 mg/dL (ref 6–20)
CHLORIDE: 109 mmol/L (ref 101–111)
CO2: 23 mmol/L (ref 22–32)
Calcium: 8.6 mg/dL — ABNORMAL LOW (ref 8.9–10.3)
Creatinine, Ser: 1.07 mg/dL (ref 0.61–1.24)
GFR calc Af Amer: >60 mL/min (ref >60)
GLUCOSE: 99 mg/dL (ref 65–99)
Potassium: 4.5 mmol/L (ref 3.5–5.1)
SODIUM: 137 mmol/L (ref 135–145)

## 2014-09-13 LAB — PHOSPHORUS: Phosphorus: 4.5 mg/dL (ref 2.5–4.6)

## 2014-09-13 LAB — HIV ANTIBODY (ROUTINE TESTING W REFLEX): HIV SCREEN 4TH GENERATION: NONREACTIVE

## 2014-09-13 MED ORDER — HYDROMORPHONE 0.3 MG/ML IV SOLN
INTRAVENOUS | Status: DC
  Administered 2014-09-13: 04:00:00 via INTRAVENOUS
  Filled 2014-09-13: qty 25

## 2014-09-13 MED ORDER — HYDROMORPHONE 0.3 MG/ML IV SOLN
INTRAVENOUS | Status: DC
  Administered 2014-09-13: 10:00:00 via INTRAVENOUS
  Administered 2014-09-13: 5.4 mg via INTRAVENOUS
  Administered 2014-09-13: 21:00:00 via INTRAVENOUS
  Administered 2014-09-13: 6.8 mg via INTRAVENOUS
  Administered 2014-09-13: 0.3 mg via INTRAVENOUS
  Administered 2014-09-13: 2.42 mg via INTRAVENOUS
  Administered 2014-09-13: 5.76 mg via INTRAVENOUS
  Administered 2014-09-14: 4.47 mg via INTRAVENOUS
  Administered 2014-09-14: 01:00:00 via INTRAVENOUS
  Administered 2014-09-14: 9.72 mg via INTRAVENOUS
  Administered 2014-09-14 (×2): via INTRAVENOUS
  Administered 2014-09-14: 3.6 mg via INTRAVENOUS
  Administered 2014-09-14: 13:00:00 via INTRAVENOUS
  Administered 2014-09-15: 3.6 mg via INTRAVENOUS
  Administered 2014-09-15: 0.3 mg via INTRAVENOUS
  Administered 2014-09-15: 1.8 mg via INTRAVENOUS
  Filled 2014-09-13 (×8): qty 25

## 2014-09-13 MED ORDER — HYDROMORPHONE 0.3 MG/ML IV SOLN
INTRAVENOUS | Status: AC
  Administered 2014-09-13: 01:00:00
  Filled 2014-09-13: qty 25

## 2014-09-13 NOTE — Progress Notes (Signed)
SICKLE CELL SERVICE PROGRESS NOTE  [name] QMG:[id number] DOB: [birth year] DOA: 09/10/2014 PCP: [name], MD  Assessment/Plan: Principal Problem:   Hb-SS disease with crisis Active Problems:   Hx of pulmonary embolus   Functional asplenia   Pulmonary HTN   Chronic anticoagulation   Essential hypertension   Chronic pain syndrome   Hypoxia   Anemia of chronic disease   Cor pulmonale, chronic   Elevated troponin I level   PVC's (premature ventricular contractions)   Abnormal EKG   Hypoxemia   Acute respiratory failure with hypoxia   Acute chest syndrome in sickle crisis   Respiratory failure  1. Acute on chronic hypoxic respiratory failure: Secondary to pulmonary edema most likely from chronic pulmonary hypertension. Patient has been transferred back to [place] [facility] from [facility]. Exchange transfusion not done. He is much better now but cardiology workup continuing. He needs to be on oxygen constantly at time of discharge. He has oxygen at home but has not begun using it prior to coming in. 2. Leukocytosis: Stable. He has been placed empirically on broad spectrum antibiotics. This needs to be de-escalated prior to discharge 3. 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 4. Hb SS with crisis: Continue PCA and Toradol.  5. Hypokalemia: replaced orally. 6. Elevated Troponin: Troponin trending down. Feel that this is likely related to [facility] and pulmonary edema with right heart failure. Continue ASA and Metoprolol. Continue pericardial 7. Anemia of chronic Disease: H&H has stabilized now. Continue to monitor 8. Chronic pain: Continue MS Contin 9. Medication adherence: Pt is supposed to be on Hydrea but MCV is not reflective of compliance. 10. Pulmonary Hypertension: Pt has severe pulmonary HTN with Chronic Right Sided systolic Heart Failure. Cardiology now following patient closely. Also pulmonary follow-up   Family Communication: N/A. Pt alert  and oriented x 3. Disposition Plan: Possible discharge on Monday with home health and oxygen.  [facility]  Pager [phone]. If 7PM-7AM, please contact night-coverage.  09/13/2014, 4:38 PM  LOS: 3 days   Admitting History: Opiate tolerant patient with Hb SS who presented to the ED with pain in b/l ribs typical of sicke cell pain. Pt describes the pain as sharp and throbbing in nature and non-radiating. At it's greatest intensity. Pain was 10/10 and patient took oral Dilaudid without much relief. Thus he came to the ED. Of note, patient is supposed to be on MS Contin on a daily basis but has been unable to obtain prescription so has not had MS Contin since last week. He denies dizziness, Pre-cordial chest wall pain, pain with deep breathing, SOB, DOE, F/C, N/V/D. He also has known PAH and is chronically hypoxic with an Oxygen requirement of 3 L/min during activity and at night. However he has not been compliant wit the use of Oxygen.  Incidental findings during the course of his ED evaluation was an elevated Troponin of 3.48 without any significant EKG changes. He was also found to have an elevated BNP of 549.5 which is below his BNP Level of 1117 12 days go and chronically elevated pro-BNP as high as 5000 in the absence of N-STEMI. He was previously evaluated by Cardiology (07/23/2014) who recommended [facility] as an out-patient but patient has not yet been referred. He was also found to have an elevated D-dimer and a CT angiogram demonstrated chronic bi-lateral PE but no new PE. Please note that patient missed one dose of Lovenox yesterday but states that he has been compliant with it's use  otherwise  Consultants:  CCM  Cardiology  Procedures:  None  Antibiotics:  Vancomycin 8/10 >>  Zosyn 8/10 >>  HPI/Subjective: Pt transferred from [place]: This morning because he is stable. His pain is much better and he is currently on low-concentration Dilaudid PCA. Oxygen Demand is low now.  Cardiology is following. His pain is down to 6 out of 10 and he has no complaint.  Objective: Filed Vitals:   09/13/14 0800 09/13/14 1200 09/13/14 1505 09/13/14 1624  BP:   111/77   Pulse:   71   Temp:   97.9 F (36.6 C)   TempSrc:   Oral   Resp: 15 14 16 17   Height:   6' (1.829 m)   Weight:   71.4 kg (157 lb 6.5 oz)   SpO2: 90% 96% 95% 93%   Weight change:   Intake/Output Summary (Last 24 hours) at 09/13/14 1638 Last data filed at 09/13/14 1425  Gross per 24 hour  Intake  572.5 ml  Output   1855 ml  Net -1282.5 ml    General: Alert, awake, oriented x3, in no acute distress.  HEENT: [place]/AT PEERL, EOMI, anicteric Neck: Trachea midline,  no masses, no thyromegal,y no JVD, no carotid bruit OROPHARYNX:  Moist, No exudate/ erythema/lesions.  Heart: Regular rate and rhythm, III/VI SEM at base without radiation.   Loud P2 noted. Lungs: Clear to auscultation, no wheezing or rhonchi noted. No increased work of breathing noted.  Abdomen: Soft, nontender, nondistended, positive bowel sounds, no masses no hepatosplenomegaly noted.  Neuro: No focal neurological deficits noted cranial nerves II through XII grossly intact.  Strength at functional baseline in bilateral upper and lower extremities. Musculoskeletal: No warm swelling or erythema around joints, no spinal tenderness noted. Psychiatric: Patient alert and oriented x3, good insight and cognition, good recent to remote recall.    Data Reviewed: Basic Metabolic Panel:  Recent Labs Lab 09/10/14 0553 09/10/14 0645 09/11/14 0238 09/12/14 0830 09/13/14 0425  NA 139  --  139 138 137  K 3.2*  --  4.0 3.9 4.5  CL 109  --  109 107 109  CO2 22  --  21* 22 23  GLUCOSE 115*  --  109* 95 99  BUN 14  --  10 11 14   CREATININE 0.81  --  0.91 1.04 1.07  CALCIUM 8.9  --  9.0 8.6* 8.6*  MG  --  1.6*  --   --   --   PHOS  --   --   --   --  4.5   Liver Function Tests:  Recent Labs Lab 09/10/14 0553 09/12/14 0830 09/13/14 0425  AST  41 37  --   ALT 27 22  --   ALKPHOS 67 69  --   BILITOT 3.7* 3.9*  --   PROT 7.9 7.2  --   ALBUMIN 3.8 3.2* 3.3*   No results for input(s): LIPASE, AMYLASE in the last 168 hours. No results for input(s): AMMONIA in the last 168 hours. CBC:  Recent Labs Lab 09/09/14 1125 09/10/14 0553 09/11/14 0238 09/12/14 0830  WBC 22.8* 21.9* 19.9* 16.4*  NEUTROABS 18.3* 18.2* 15.0* 12.3*  HGB 7.9* 7.3* 8.3* 7.9*  HCT 23.6* 21.4* 23.6* 23.0*  MCV 93.3 93.0 92.2 90.6  PLT 753* 737* 715* 644*   Cardiac Enzymes:  Recent Labs Lab 09/10/14 0612 09/10/14 0957 09/10/14 1600 09/11/14 0238  TROPONINI 3.48* 6.34* 7.76* 5.79*   BNP (last 3 results)  Recent Labs  08/29/14  3614 09/10/14 0612  BNP 1117.0* 549.5*    ProBNP (last 3 results)  Recent Labs  01/01/14 0909  PROBNP 5790.0*    CBG:  Recent Labs Lab 09/11/14 1821  GLUCAP 115*    Recent Results (from the past 240 hour(s))  Culture, blood (routine x 2)     Status: None (Preliminary result)   Collection Time: 09/11/14  2:25 AM  Result Value Ref Range Status   Specimen Description BLOOD RIGHT HAND  Final   Special Requests BOTTLES DRAWN AEROBIC ONLY 10CC  Final   Culture   Final    NO GROWTH 2 DAYS Performed at [facility]    Report Status PENDING  Incomplete     Studies: Dg Chest 1 View  09/10/2014   CLINICAL DATA:  Respiratory failure. Hypoxemia. Sickle cell patient.  EXAM: CHEST  1 VIEW  COMPARISON:  Radiographs and CT earlier today.  FINDINGS: 1813 hour. Stable cardiomegaly. The left subclavian Port-A-Cath is unchanged at the SVC right atrial junction. Stable chronic vascular congestion and basilar scarring. No superimposed edema, confluent airspace opacity or significant pleural effusion. 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  IMPRESSION: Stable examination compared with earlier study today. Chronic cardiomegaly and chronic vascular congestion.   Electronically Signed   By: Richardean Sale M.D.   On: 09/10/2014 18:27   Dg Chest 2 View  09/10/2014   CLINICAL DATA:  Acute onset of sickle cell pain crisis. Decreased O2 saturation. Worsening generalized weakness. Initial encounter.  EXAM: CHEST  2 VIEW  COMPARISON:  Chest radiograph from 08/29/2014  FINDINGS: The lungs are well-aerated. Mild vascular congestion is noted. Minimal bilateral atelectasis or scarring is noted. There is no evidence of pleural effusion or pneumothorax.  The heart is enlarged. A left-sided chest port is noted ending about the cavoatrial junction. No acute osseous abnormalities are seen. Clips are noted within the right upper quadrant, reflecting prior cholecystectomy.  IMPRESSION: Mild vascular congestion and cardiomegaly noted. Minimal bilateral atelectasis or scarring noted.   Electronically Signed   By: Garald Balding M.D.   On: 09/10/2014 06:00   Dg Chest 2 View  08/29/2014   CLINICAL DATA:  Sickle cell crisis. Chest pain. Severe bilateral lower extremity pain.  EXAM: CHEST  2 VIEW  COMPARISON:  08/24/2014.  FINDINGS: No change. Artifact overlies the chest. Cardiac silhouette remains enlarged. Mediastinal shadows are otherwise normal. Power port inserted from the left subclavian vein has its tip in the SVC just above the right atrium. Vascular plethora remains present without edema or effusions. Pulmonary scarring remains present.  IMPRESSION: No acute finding. Cardiomegaly. Pulmonary vascular plethora. Pulmonary scarring.   Electronically Signed   By: Nelson Chimes M.D.   On: 08/29/2014 08:49   Dg Chest 2 View  08/24/2014   CLINICAL DATA:  Sickle cell crisis, shortness of breath  EXAM: CHEST  2 VIEW  COMPARISON:  08/05/2014  FINDINGS: Moderate enlargement of the cardiac silhouette is reidentified. Diffusely prominent interstitial markings are stable with areas of linear probable superimposed scarring. No pleural effusion. Cholecystectomy clips are noted. No acute osseous finding. Left-sided Port-A-Cath in  place with tip at the cavoatrial junction.  IMPRESSION: No focal acute finding.   Electronically Signed   By: Conchita Paris M.D.   On: 08/24/2014 08:41   Dg Ankle Complete Left  08/24/2014   CLINICAL DATA:  Sickle cell crisis with left lower leg and ankle pain and swelling.  EXAM: LEFT ANKLE COMPLETE - 3+ VIEW  COMPARISON:  12/21/2006  FINDINGS: Diffuse soft tissue swelling is seen surrounding the left ankle. No evidence of fracture, dislocation or bony destruction. The ankle mortise shows normal alignment. No bony lesions or visible bony infarcts by x-ray. No significant degenerative changes.  IMPRESSION: Soft tissue swelling surrounding left ankle. No underlying bony abnormalities are identified.   Electronically Signed   By: Aletta Edouard M.D.   On: 08/24/2014 08:35   Ct Angio Chest Pe W/cm &/or Wo Cm  09/10/2014   CLINICAL DATA:  Short of breath.  EXAM: CT ANGIOGRAPHY CHEST WITH CONTRAST  TECHNIQUE: Multidetector CT imaging of the chest was performed using the standard protocol during bolus administration of intravenous contrast. Multiplanar CT image reconstructions and MIPs were obtained to evaluate the vascular anatomy.  CONTRAST:  125mL OMNIPAQUE IOHEXOL 350 MG/ML SOLN  COMPARISON:  08/29/2014  FINDINGS: Mediastinum: There is moderate cardiac enlargement. No pericardial effusion. The trachea is patent and appears midline. Normal appearance of the esophagus. Prominent soft tissue is identified within the anterior mediastinum. Stress set prominent anterior mediastinal adenopathy is again identified. Index pre-vascular lymph node measures 1 cm, image 35/series 4. Previously 1.2 cm. Bilateral lower lobe pulmonary artery filling defects are again noted and are in the same distribution as the previous exam. No new pulmonary emboli identified.  Lungs/Pleura: There is no pleural effusion. Mosaic attenuation pattern is identified throughout both lungs compatible with pulmonary emboli. No airspace  consolidation. Scarring is noted predominantly involving the lower lobes.  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 Diffuse increased attenuation throughout the liver is noted. The visualized adrenal glands are unremarkable.  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  Review of the MIP images confirms the above findings.  IMPRESSION: 1. Again noted are chronic bilateral lower lobe pulmonary emboli. No new areas of pulmonary emboli identified. 2. Cardiac enlargement 3. Pulmonary and osseous stigmata of sickle cell disease.   Electronically Signed   By: Kerby Moors M.D.   On: 09/10/2014 09:00   Ct Angio Chest Pe W/cm &/or Wo Cm  08/29/2014   CLINICAL DATA:  Chest pain.  Sickle cell.  Recent pulmonary embolism  EXAM: CT ANGIOGRAPHY CHEST WITH CONTRAST  TECHNIQUE: Multidetector CT imaging of the chest was performed using the standard protocol during bolus administration of intravenous contrast. Multiplanar CT image reconstructions and MIPs were obtained to evaluate the vascular anatomy.  CONTRAST:  18mL OMNIPAQUE IOHEXOL 350 MG/ML SOLN  COMPARISON:  CT chest 07/20/2014  FINDINGS: Small filling defects in the lower lobe pulmonary arteries are unchanged from the prior study and consistent with residual pulmonary emboli which are not dissolved. No new emboli identified. It is unlikely these are recurrent emboli as the are in the exact same location as the prior study.  Cardiac enlargement without pericardial effusion. Right ventricular enlargement. RV/LV ratio is 1.5 suggesting right heart strain. Pulmonary arteries are enlarged.  Port-A-Cath in the SVC unchanged.  Because  Anterior mediastinal adenopathy measuring up to 12 mm unchanged. No hilar adenopathy.  Patchy airspace densities in the posterior lung bases bilaterally unchanged most consistent with atelectasis or  scarring. Additional ground-glass density in the lungs bilaterally most notably in the right lung are unchanged and may be related to chronic lung disease or pulmonary emboli.  Upper abdomen negative.  Negative for   effusion.  Review of the MIP images confirms the above findings.  IMPRESSION: Small filling defects in the  lower lobe pulmonary arteries unchanged from the CT of 07/20/2014 compatible with residual emboli which have not dissolved. No new emboli identified.  Cardiac enlargement. Pulmonary artery enlargement. Enlarged right ventricle. RV/LV ratio 1.5 consistent with right ventricle strain likely due to pulmonary hypertension.  Critical Value/emergent results were called by telephone at the time of interpretation on 08/29/2014 at 10:28 am to Dr. Gareth Morgan , who verbally acknowledged these results.   Electronically Signed   By: Franchot Gallo M.D.   On: 08/29/2014 10:28   Dg Chest 1v Repeat Same Day  08/29/2014   CLINICAL DATA:  34 year old male with shortness of breath for 1 day. History sickle cell disease.  EXAM: CHEST - 1 VIEW SAME DAY  COMPARISON:  10/21/2014 and prior radiographs  FINDINGS: Cardiomegaly and left subclavian Port-A-Cath with tip overlying the superior cavoatrial junction again noted.  Increased pulmonary vascularity noted.  There is no evidence of focal airspace disease, pulmonary edema, suspicious pulmonary nodule/mass, pleural effusion, or pneumothorax. No acute bony abnormalities are identified.  IMPRESSION: Cardiomegaly and increased pulmonary vascularity without acute abnormality.   Electronically Signed   By: Margarette Canada M.D.   On: 08/29/2014 19:13   Dg Chest Port 1 View  09/12/2014   CLINICAL DATA:  Respiratory failure.  EXAM: PORTABLE CHEST - 1 VIEW  COMPARISON:  Chest radiograph from 1 day prior.  FINDINGS: Left subclavian central venous catheter terminates near the cavoatrial junction. Stable cardiomediastinal silhouette with mild to moderate cardiomegaly. No  pneumothorax. No pleural effusion. There is stable mild to moderate pulmonary edema. No new focal lung opacity. Stable mild scarring versus atelectasis in the mid to lower left lung.  IMPRESSION: 1. Stable mild to moderate congestive heart failure. 2. Stable mild scarring versus atelectasis in the basilar left lung.   Electronically Signed   By: Ilona Sorrel M.D.   On: 09/12/2014 09:32   Dg Chest Port 1 View  09/11/2014   CLINICAL DATA:  Respiratory failure.  EXAM: PORTABLE CHEST - 1 VIEW  COMPARISON:  09/10/2014.  FINDINGS: PowerPort catheter noted in good anatomic position. Mediastinum and hilar structures normal. Cardiomegaly with mild pulmonary vascular prominence and interstitial prominence. Mild component of congestive heart failure cannot be excluded. No pleural effusion or pneumothorax.  IMPRESSION: 1. Power port catheter noted with tip in stable position.  2. Severe cardiomegaly again noted. Mild pulmonary vascular prominence interstitial prominence. Mild component of congestive heart failure cannot be excluded.   Electronically Signed   By: Marcello Moores  Register   On: 09/11/2014 07:14    Scheduled Meds: . antiseptic oral rinse  7 mL Mouth Rinse q12n4p  . aspirin  81 mg Oral Daily  . chlorhexidine  15 mL Mouth Rinse BID  . cholecalciferol  2,000 Units Oral Daily  . diltiazem  120 mg Oral Daily  . enoxaparin (LOVENOX) injection  100 mg Subcutaneous Q24H  . folic acid  1 mg Oral q morning - 10a  . HYDROmorphone PCA 0.3 mg/mL   Intravenous 6 times per day  . hydroxyurea  1,500 mg Oral Daily  . ketorolac  30 mg Intravenous 4 times per day  . lisinopril  10 mg Oral Daily  . metoprolol tartrate  25 mg Oral BID  . morphine  30 mg Oral Q12H  . mupirocin ointment  1 application Topical BID  . piperacillin-tazobactam (ZOSYN)  IV  3.375 g Intravenous 3 times per day  . potassium chloride SA  20 mEq Oral q morning - 10a  . senna-docusate  1 tablet Oral BID  . sodium chloride  3 mL Intravenous Q12H   . vancomycin  1,000 mg Intravenous Q12H   Continuous Infusions:    Time spent 1 hour. More than 50% time spent in discussion. Counseling and coordination of care.

## 2014-09-13 NOTE — Progress Notes (Signed)
Patient transferred to [facility] bed 1421. Belongings sent with patient. Vitals stable and skin intact. Report called to receiving RN. Pt transported via [place].

## 2014-09-13 NOTE — Progress Notes (Addendum)
Pharmacy Progress Note  Patient is currently on hydromorphone high concentration PCA for sickle cell pain crisis which is only stocked at [facility] and therefore our pharmacy is unable to provide a refill for the order. Discussed patient with Dr. Elsworth Soho who recommended switching to regular concentration hydromorphone PCA for now with identical parameters. Recommend re-assessing patient tomorrow if current strategy proves inefficient.   Albertina Parr, PharmD., BCPS Clinical Pharmacist Pager [phone]  Addendum: Hydromorphone PCA pump has a 1 hour hard max dose limit of 3 mg. Therefore, the order was changed to reflect this max dose. RN instructed to call MD for one-time hydromorphone orders if patient complains of pain. MD to re-assess in AM.  Albertina Parr, PharmD., BCPS Clinical Pharmacist Pager [phone]

## 2014-09-13 NOTE — Progress Notes (Signed)
Dilaudid PCA syringe near end. New Dilaudid syringe pulled, but pyxis dilaudid PCA is not the same concentration as the high concentration dose that is ordered for patient.  Medication scanned fine so syringe seal was opened.  During dual sign off, nurses noticed the difference in concentrations.  Pharmacy was notified, and all of syringe concentration was wasted and documented.

## 2014-09-14 ENCOUNTER — Other Ambulatory Visit: Payer: Self-pay

## 2014-09-14 ENCOUNTER — Inpatient Hospital Stay (HOSPITAL_COMMUNITY): Payer: Medicare Other

## 2014-09-14 DIAGNOSIS — I2699 Other pulmonary embolism without acute cor pulmonale: Secondary | ICD-10-CM

## 2014-09-14 LAB — CBC WITH DIFFERENTIAL/PLATELET
BASOS PCT: 1 % (ref 0–1)
Basophils Absolute: 0.1 10*3/uL (ref 0.0–0.1)
Eosinophils Absolute: 0.6 10*3/uL (ref 0.0–0.7)
Eosinophils Relative: 5 % (ref 0–5)
HEMATOCRIT: 21.2 % — AB (ref 39.0–52.0)
HEMOGLOBIN: 7.3 g/dL — AB (ref 13.0–17.0)
LYMPHS ABS: 2.1 10*3/uL (ref 0.7–4.0)
LYMPHS PCT: 16 % (ref 12–46)
MCH: 31.3 pg (ref 26.0–34.0)
MCHC: 34.4 g/dL (ref 30.0–36.0)
MCV: 91 fL (ref 78.0–100.0)
Monocytes Absolute: 1 10*3/uL (ref 0.1–1.0)
Monocytes Relative: 8 % (ref 3–12)
NEUTROS ABS: 8.8 10*3/uL — AB (ref 1.7–7.7)
NEUTROS PCT: 70 % (ref 43–77)
Platelets: 634 10*3/uL — ABNORMAL HIGH (ref 150–400)
RBC: 2.33 MIL/uL — AB (ref 4.22–5.81)
RDW: 19.1 % — AB (ref 11.5–15.5)
WBC: 12.6 10*3/uL — ABNORMAL HIGH (ref 4.0–10.5)

## 2014-09-14 LAB — COMPREHENSIVE METABOLIC PANEL
ALK PHOS: 69 U/L (ref 38–126)
ALT: 18 U/L (ref 17–63)
AST: 28 U/L (ref 15–41)
Albumin: 3.4 g/dL — ABNORMAL LOW (ref 3.5–5.0)
Anion gap: 5 (ref 5–15)
BILIRUBIN TOTAL: 2.4 mg/dL — AB (ref 0.3–1.2)
BUN: 13 mg/dL (ref 6–20)
CHLORIDE: 111 mmol/L (ref 101–111)
CO2: 22 mmol/L (ref 22–32)
CREATININE: 0.93 mg/dL (ref 0.61–1.24)
Calcium: 8.7 mg/dL — ABNORMAL LOW (ref 8.9–10.3)
Glucose, Bld: 97 mg/dL (ref 65–99)
Potassium: 4.3 mmol/L (ref 3.5–5.1)
Sodium: 138 mmol/L (ref 135–145)
TOTAL PROTEIN: 6.8 g/dL (ref 6.5–8.1)

## 2014-09-14 NOTE — Progress Notes (Signed)
SICKLE CELL SERVICE PROGRESS NOTE  [name] ZDG:[id number] DOB: 10-08-1980 DOA: 09/10/2014 PCP: [name], MD  Assessment/Plan: Principal Problem:   Hb-SS disease with crisis Active Problems:   Hx of pulmonary embolus   Functional asplenia   Pulmonary HTN   Chronic anticoagulation   Essential hypertension   Chronic pain syndrome   Hypoxia   Anemia of chronic disease   Cor pulmonale, chronic   Elevated troponin I level   PVC's (premature ventricular contractions)   Abnormal EKG   Hypoxemia   Acute respiratory failure with hypoxia   Acute chest syndrome in sickle crisis   Respiratory failure  1. Acute on chronic hypoxic respiratory failure: Secondary to pulmonary edema most likely from chronic pulmonary hypertension. He is much better now but cardiology workup continuing. He needs to be on oxygen constantly at time of discharge. He has oxygen at home but has not begun using it prior to coming in. Extensive discussion with the patient today regarding the need for him to use his oxygen at home. No other complaints otherwise 2. Leukocytosis: Stable. He has been placed empirically on broad spectrum antibiotics. This needs to be de-escalated prior to discharge. Cultures have been negative. 3. 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 4. Hb SS with crisis: Continue PCA and Toradol.  5. Hypokalemia: replaced orally. 6. Elevated Troponin: Troponin trending down. Feel that this is likely related to [facility] and pulmonary edema with right heart failure. Continue ASA and Metoprolol. Continue pericardial 7. Anemia of chronic Disease: H&H has stabilized now. Continue to monitor 8. Chronic pain: Continue MS Contin 9. Medication adherence: Pt is supposed to be on Hydrea but MCV is not reflective of compliance. 10. Pulmonary Hypertension: Pt has severe pulmonary HTN with Chronic Right Sided systolic Heart Failure. Cardiology now following patient closely. Also  pulmonary follow-up   Family Communication: N/A. Pt alert and oriented x 3. Disposition Plan: Possible discharge on Monday with home health and oxygen.  [name]  Pager [phone]. If 7PM-7AM, please contact night-coverage.  09/14/2014, 7:04 PM  LOS: 4 days   Admitting History: Opiate tolerant patient with Hb SS who presented to the ED with pain in b/l ribs typical of sicke cell pain. Pt describes the pain as sharp and throbbing in nature and non-radiating. At it's greatest intensity. Pain was 10/10 and patient took oral Dilaudid without much relief. Thus he came to the ED. Of note, patient is supposed to be on MS Contin on a daily basis but has been unable to obtain prescription so has not had MS Contin since last week. He denies dizziness, Pre-cordial chest wall pain, pain with deep breathing, SOB, DOE, F/C, N/V/D. He also has known PAH and is chronically hypoxic with an Oxygen requirement of 3 L/min during activity and at night. However he has not been compliant wit the use of Oxygen.  Incidental findings during the course of his ED evaluation was an elevated Troponin of 3.48 without any significant EKG changes. He was also found to have an elevated BNP of 549.5 which is below his BNP Level of 1117 12 days go and chronically elevated pro-BNP as high as 5000 in the absence of N-STEMI. He was previously evaluated by Cardiology (07/23/2014) who recommended [facility] as an out-patient but patient has not yet been referred. He was also found to have an elevated D-dimer and a CT angiogram demonstrated chronic bi-lateral PE but no new PE. Please note that patient missed one dose of Lovenox yesterday  but states that he has been compliant with it's use otherwise  Consultants:  CCM  Cardiology  Procedures:  None  Antibiotics:  Vancomycin 8/10 >>  Zosyn 8/10 >>  HPI/Subjective: Pt doing much better today. Main complaint has to do with diet. Breathing better and not requiring much  oxygen today. Diuresing very well. Cardiology is following. His pain is down to 6 out of 10 and he has no complaint.  Objective: Filed Vitals:   09/14/14 1006 09/14/14 1132 09/14/14 1333 09/14/14 1549  BP: 105/66  108/79   Pulse: 60  63   Temp:   98.2 F (36.8 C)   TempSrc:   Oral   Resp:  16 12 18   Height:      Weight:      SpO2:  98% 95% 94%   Weight change: 0.8 kg (1 lb 12.2 oz)  Intake/Output Summary (Last 24 hours) at 09/14/14 1904 Last data filed at 09/14/14 1300  Gross per 24 hour  Intake    710 ml  Output   2700 ml  Net  -1990 ml    General: Alert, awake, oriented x3, in no acute distress.  HEENT: [place]/AT PEERL, EOMI, anicteric Neck: Trachea midline,  no masses, no thyromegal,y no JVD, no carotid bruit OROPHARYNX:  Moist, No exudate/ erythema/lesions.  Heart: Regular rate and rhythm, III/VI SEM at base without radiation.   Loud P2 noted. Lungs: Clear to auscultation, no wheezing or rhonchi noted. No increased work of breathing noted.  Abdomen: Soft, nontender, nondistended, positive bowel sounds, no masses no hepatosplenomegaly noted.  Neuro: No focal neurological deficits noted cranial nerves II through XII grossly intact.  Strength at functional baseline in bilateral upper and lower extremities. Musculoskeletal: No warm swelling or erythema around joints, no spinal tenderness noted. Psychiatric: Patient alert and oriented x3, good insight and cognition, good recent to remote recall.    Data Reviewed: Basic Metabolic Panel:  Recent Labs Lab 09/10/14 0553 09/10/14 0645 09/11/14 0238 09/12/14 0830 09/13/14 0425 09/14/14 0600  NA 139  --  139 138 137 138  K 3.2*  --  4.0 3.9 4.5 4.3  CL 109  --  109 107 109 111  CO2 22  --  21* 22 23 22   GLUCOSE 115*  --  109* 95 99 97  BUN 14  --  10 11 14 13   CREATININE 0.81  --  0.91 1.04 1.07 0.93  CALCIUM 8.9  --  9.0 8.6* 8.6* 8.7*  MG  --  1.6*  --   --   --   --   PHOS  --   --   --   --  4.5  --    Liver Function  Tests:  Recent Labs Lab 09/10/14 0553 09/12/14 0830 09/13/14 0425 09/14/14 0600  AST 41 37  --  28  ALT 27 22  --  18  ALKPHOS 67 69  --  69  BILITOT 3.7* 3.9*  --  2.4*  PROT 7.9 7.2  --  6.8  ALBUMIN 3.8 3.2* 3.3* 3.4*   No results for input(s): LIPASE, AMYLASE in the last 168 hours. No results for input(s): AMMONIA in the last 168 hours. CBC:  Recent Labs Lab 09/10/14 0553 09/11/14 0238 09/12/14 0830 09/13/14 0425 09/14/14 0600  WBC 21.9* 19.9* 16.4* 14.0* 12.6*  NEUTROABS 18.2* 15.0* 12.3* 9.1* 8.8*  HGB 7.3* 8.3* 7.9* 8.0* 7.3*  HCT 21.4* 23.6* 23.0* 23.5* 21.2*  MCV 93.0 92.2 90.6 93.3 91.0  PLT  737* 715* 644* 655* 634*   Cardiac Enzymes:  Recent Labs Lab 09/10/14 0612 09/10/14 0957 09/10/14 1600 09/11/14 0238  TROPONINI 3.48* 6.34* 7.76* 5.79*   BNP (last 3 results)  Recent Labs  08/29/14 0810 09/10/14 0612  BNP 1117.0* 549.5*    ProBNP (last 3 results)  Recent Labs  01/01/14 0909  PROBNP 5790.0*    CBG:  Recent Labs Lab 09/11/14 1821  GLUCAP 115*    Recent Results (from the past 240 hour(s))  Culture, blood (routine x 2)     Status: None (Preliminary result)   Collection Time: 09/11/14  2:25 AM  Result Value Ref Range Status   Specimen Description BLOOD RIGHT HAND  Final   Special Requests BOTTLES DRAWN AEROBIC ONLY 10CC  Final   Culture   Final    NO GROWTH 3 DAYS Performed at [facility]    Report Status PENDING  Incomplete  Respiratory virus panel     Status: None   Collection Time: 09/11/14  9:42 AM  Result Value Ref Range Status   Source - RVPAN NASOPHARYNGEAL SWAB  Corrected   Respiratory Syncytial Virus A Negative Negative Final   Respiratory Syncytial Virus B Negative Negative Final   Influenza A Negative Negative Final   Influenza B Negative Negative Final   Parainfluenza 1 Negative Negative Final   Parainfluenza 2 Negative Negative Final   Parainfluenza 3 Negative Negative Final   Metapneumovirus  Negative Negative Final   Rhinovirus Negative Negative Final   Adenovirus Negative Negative Final    Comment: (NOTE) Performed At: [facility] [place], [place] [id number] Lindon Romp MD IP:[id number]      Studies: Dg Chest 1 View  09/10/2014   CLINICAL DATA:  Respiratory failure. Hypoxemia. Sickle cell patient.  EXAM: CHEST  1 VIEW  COMPARISON:  Radiographs and CT earlier today.  FINDINGS: 1813 hour. Stable cardiomegaly. The left subclavian Port-A-Cath is unchanged at the SVC right atrial junction. Stable chronic vascular congestion and basilar scarring. No superimposed edema, confluent airspace opacity or significant pleural effusion. 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  IMPRESSION: Stable examination compared with earlier study today. Chronic cardiomegaly and chronic vascular congestion.   Electronically Signed   By: Richardean Sale M.D.   On: 09/10/2014 18:27   Dg Chest 2 View  09/10/2014   CLINICAL DATA:  Acute onset of sickle cell pain crisis. Decreased O2 saturation. Worsening generalized weakness. Initial encounter.  EXAM: CHEST  2 VIEW  COMPARISON:  Chest radiograph from 08/29/2014  FINDINGS: The lungs are well-aerated. Mild vascular congestion is noted. Minimal bilateral atelectasis or scarring is noted. There is no evidence of pleural effusion or pneumothorax.  The heart is enlarged. A left-sided chest port is noted ending about the cavoatrial junction. No acute osseous abnormalities are seen. Clips are noted within the right upper quadrant, reflecting prior cholecystectomy.  IMPRESSION: Mild vascular congestion and cardiomegaly noted. Minimal bilateral atelectasis or scarring noted.   Electronically Signed   By: Garald Balding M.D.   On: 09/10/2014 06:00   Dg Chest 2 View  08/29/2014   CLINICAL DATA:  Sickle cell crisis. Chest pain. Severe bilateral lower extremity pain.  EXAM: CHEST  2 VIEW  COMPARISON:  08/24/2014.  FINDINGS: No  change. Artifact overlies the chest. Cardiac silhouette remains enlarged. Mediastinal shadows are otherwise normal. Power port inserted from the left subclavian vein has its tip in the SVC just above the right atrium. Vascular plethora remains  present without edema or effusions. Pulmonary scarring remains present.  IMPRESSION: No acute finding. Cardiomegaly. Pulmonary vascular plethora. Pulmonary scarring.   Electronically Signed   By: Nelson Chimes M.D.   On: 08/29/2014 08:49   Dg Chest 2 View  08/24/2014   CLINICAL DATA:  Sickle cell crisis, shortness of breath  EXAM: CHEST  2 VIEW  COMPARISON:  08/05/2014  FINDINGS: Moderate enlargement of the cardiac silhouette is reidentified. Diffusely prominent interstitial markings are stable with areas of linear probable superimposed scarring. No pleural effusion. Cholecystectomy clips are noted. No acute osseous finding. Left-sided Port-A-Cath in place with tip at the cavoatrial junction.  IMPRESSION: No focal acute finding.   Electronically Signed   By: Conchita Paris M.D.   On: 08/24/2014 08:41   Dg Ankle Complete Left  08/24/2014   CLINICAL DATA:  Sickle cell crisis with left lower leg and ankle pain and swelling.  EXAM: LEFT ANKLE COMPLETE - 3+ VIEW  COMPARISON:  12/21/2006  FINDINGS: Diffuse soft tissue swelling is seen surrounding the left ankle. No evidence of fracture, dislocation or bony destruction. The ankle mortise shows normal alignment. No bony lesions or visible bony infarcts by x-ray. No significant degenerative changes.  IMPRESSION: Soft tissue swelling surrounding left ankle. No underlying bony abnormalities are identified.   Electronically Signed   By: Aletta Edouard M.D.   On: 08/24/2014 08:35   Ct Angio Chest Pe W/cm &/or Wo Cm  09/10/2014   CLINICAL DATA:  Short of breath.  EXAM: CT ANGIOGRAPHY CHEST WITH CONTRAST  TECHNIQUE: Multidetector CT imaging of the chest was performed using the standard protocol during bolus administration of  intravenous contrast. Multiplanar CT image reconstructions and MIPs were obtained to evaluate the vascular anatomy.  CONTRAST:  166mL OMNIPAQUE IOHEXOL 350 MG/ML SOLN  COMPARISON:  08/29/2014  FINDINGS: Mediastinum: There is moderate cardiac enlargement. No pericardial effusion. The trachea is patent and appears midline. Normal appearance of the esophagus. Prominent soft tissue is identified within the anterior mediastinum. Stress set prominent anterior mediastinal adenopathy is again identified. Index pre-vascular lymph node measures 1 cm, image 35/series 4. Previously 1.2 cm. Bilateral lower lobe pulmonary artery filling defects are again noted and are in the same distribution as the previous exam. No new pulmonary emboli identified.  Lungs/Pleura: There is no pleural effusion. Mosaic attenuation pattern is identified throughout both lungs compatible with pulmonary emboli. No airspace consolidation. Scarring is noted predominantly involving the lower lobes.  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 Diffuse increased attenuation throughout the liver is noted. The visualized adrenal glands are unremarkable.  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  Review of the MIP images confirms the above findings.  IMPRESSION: 1. Again noted are chronic bilateral lower lobe pulmonary emboli. No new areas of pulmonary emboli identified. 2. Cardiac enlargement 3. Pulmonary and osseous stigmata of sickle cell disease.   Electronically Signed   By: Kerby Moors M.D.   On: 09/10/2014 09:00   Ct Angio Chest Pe W/cm &/or Wo Cm  08/29/2014   CLINICAL DATA:  Chest pain.  Sickle cell.  Recent pulmonary embolism  EXAM: CT ANGIOGRAPHY CHEST WITH CONTRAST  TECHNIQUE: Multidetector CT imaging of the chest was performed using the standard protocol during bolus administration of intravenous  contrast. Multiplanar CT image reconstructions and MIPs were obtained to evaluate the vascular anatomy.  CONTRAST:  125mL OMNIPAQUE IOHEXOL 350 MG/ML SOLN  COMPARISON:  CT chest 07/20/2014  FINDINGS: Small filling defects in the lower lobe pulmonary arteries are unchanged from the prior study and consistent with residual pulmonary emboli which are not dissolved. No new emboli identified. It is unlikely these are recurrent emboli as the are in the exact same location as the prior study.  Cardiac enlargement without pericardial effusion. Right ventricular enlargement. RV/LV ratio is 1.5 suggesting right heart strain. Pulmonary arteries are enlarged.  Port-A-Cath in the SVC unchanged.  Because  Anterior mediastinal adenopathy measuring up to 12 mm unchanged. No hilar adenopathy.  Patchy airspace densities in the posterior lung bases bilaterally unchanged most consistent with atelectasis or scarring. Additional ground-glass density in the lungs bilaterally most notably in the right lung are unchanged and may be related to chronic lung disease or pulmonary emboli.  Upper abdomen negative.  Negative for   effusion.  Review of the MIP images confirms the above findings.  IMPRESSION: Small filling defects in the lower lobe pulmonary arteries unchanged from the CT of 07/20/2014 compatible with residual emboli which have not dissolved. No new emboli identified.  Cardiac enlargement. Pulmonary artery enlargement. Enlarged right ventricle. RV/LV ratio 1.5 consistent with right ventricle strain likely due to pulmonary hypertension.  Critical Value/emergent results were called by telephone at the time of interpretation on 08/29/2014 at 10:28 am to Dr. Gareth Morgan , who verbally acknowledged these results.   Electronically Signed   By: Franchot Gallo M.D.   On: 08/29/2014 10:28   Dg Chest 1v Repeat Same Day  08/29/2014   CLINICAL DATA:  34 year old male with shortness of breath for 1 day. History sickle cell disease.  EXAM:  CHEST - 1 VIEW SAME DAY  COMPARISON:  10/21/2014 and prior radiographs  FINDINGS: Cardiomegaly and left subclavian Port-A-Cath with tip overlying the superior cavoatrial junction again noted.  Increased pulmonary vascularity noted.  There is no evidence of focal airspace disease, pulmonary edema, suspicious pulmonary nodule/mass, pleural effusion, or pneumothorax. No acute bony abnormalities are identified.  IMPRESSION: Cardiomegaly and increased pulmonary vascularity without acute abnormality.   Electronically Signed   By: Margarette Canada M.D.   On: 08/29/2014 19:13   Dg Chest Port 1 View  09/12/2014   CLINICAL DATA:  Respiratory failure.  EXAM: PORTABLE CHEST - 1 VIEW  COMPARISON:  Chest radiograph from 1 day prior.  FINDINGS: Left subclavian central venous catheter terminates near the cavoatrial junction. Stable cardiomediastinal silhouette with mild to moderate cardiomegaly. No pneumothorax. No pleural effusion. There is stable mild to moderate pulmonary edema. No new focal lung opacity. Stable mild scarring versus atelectasis in the mid to lower left lung.  IMPRESSION: 1. Stable mild to moderate congestive heart failure. 2. Stable mild scarring versus atelectasis in the basilar left lung.   Electronically Signed   By: Ilona Sorrel M.D.   On: 09/12/2014 09:32   Dg Chest Port 1 View  09/11/2014   CLINICAL DATA:  Respiratory failure.  EXAM: PORTABLE CHEST - 1 VIEW  COMPARISON:  09/10/2014.  FINDINGS: PowerPort catheter noted in good anatomic position. Mediastinum and hilar structures normal. Cardiomegaly with mild pulmonary vascular prominence and interstitial prominence. Mild component of congestive heart failure cannot be excluded. No pleural effusion or pneumothorax.  IMPRESSION: 1. Power port catheter noted with tip in stable position.  2. Severe cardiomegaly again noted. Mild pulmonary vascular prominence interstitial prominence. Mild component of congestive heart failure cannot be excluded.    Electronically Signed   By: Marcello Moores  Register   On: 09/11/2014 07:14  Scheduled Meds: . antiseptic oral rinse  7 mL Mouth Rinse q12n4p  . aspirin  81 mg Oral Daily  . chlorhexidine  15 mL Mouth Rinse BID  . cholecalciferol  2,000 Units Oral Daily  . diltiazem  120 mg Oral Daily  . enoxaparin (LOVENOX) injection  100 mg Subcutaneous Q24H  . folic acid  1 mg Oral q morning - 10a  . HYDROmorphone PCA 0.3 mg/mL   Intravenous 6 times per day  . hydroxyurea  1,500 mg Oral Daily  . ketorolac  30 mg Intravenous 4 times per day  . lisinopril  10 mg Oral Daily  . metoprolol tartrate  25 mg Oral BID  . morphine  30 mg Oral Q12H  . mupirocin ointment  1 application Topical BID  . piperacillin-tazobactam (ZOSYN)  IV  3.375 g Intravenous 3 times per day  . potassium chloride SA  20 mEq Oral q morning - 10a  . senna-docusate  1 tablet Oral BID  . sodium chloride  3 mL Intravenous Q12H  . vancomycin  1,000 mg Intravenous Q12H   Continuous Infusions:    Time spent 1 hour. More than 50% time spent in discussion. Counseling and coordination of care.

## 2014-09-14 NOTE — Progress Notes (Signed)
Preliminary results by tech - Bilateral Venous Duplex Completed. No evidence of deep or superficial vein thrombosis in the lower extremities.  Oda Cogan, BS, RDMS, RVT

## 2014-09-14 NOTE — Progress Notes (Signed)
Patient was transferred to [facility] prior to this RN returning PCA key to [employer]. Per pharmacy's instruction and Ventura Bruns, RN as witness, returned PCA key to [facility] via an inventory count.

## 2014-09-15 ENCOUNTER — Inpatient Hospital Stay (HOSPITAL_COMMUNITY): Payer: Medicare Other

## 2014-09-15 DIAGNOSIS — D5700 Hb-SS disease with crisis, unspecified: Secondary | ICD-10-CM | POA: Diagnosis not present

## 2014-09-15 LAB — COMPREHENSIVE METABOLIC PANEL
ALBUMIN: 3.2 g/dL — AB (ref 3.5–5.0)
ALK PHOS: 67 U/L (ref 38–126)
ALT: 16 U/L — AB (ref 17–63)
ANION GAP: 6 (ref 5–15)
AST: 28 U/L (ref 15–41)
BUN: 12 mg/dL (ref 6–20)
CALCIUM: 8.7 mg/dL — AB (ref 8.9–10.3)
CHLORIDE: 108 mmol/L (ref 101–111)
CO2: 23 mmol/L (ref 22–32)
Creatinine, Ser: 0.83 mg/dL (ref 0.61–1.24)
GFR calc non Af Amer: >60 mL/min (ref >60)
GLUCOSE: 103 mg/dL — AB (ref 65–99)
Potassium: 4 mmol/L (ref 3.5–5.1)
SODIUM: 137 mmol/L (ref 135–145)
Total Bilirubin: 2.4 mg/dL — ABNORMAL HIGH (ref 0.3–1.2)
Total Protein: 6.9 g/dL (ref 6.5–8.1)

## 2014-09-15 LAB — LEGIONELLA ANTIGEN, URINE

## 2014-09-15 LAB — CBC WITH DIFFERENTIAL/PLATELET
BASOS PCT: 1 % (ref 0–1)
Basophils Absolute: 0.1 10*3/uL (ref 0.0–0.1)
EOS ABS: 0.6 10*3/uL (ref 0.0–0.7)
EOS PCT: 4 % (ref 0–5)
HCT: 21.6 % — ABNORMAL LOW (ref 39.0–52.0)
HEMOGLOBIN: 7.2 g/dL — AB (ref 13.0–17.0)
Lymphocytes Relative: 15 % (ref 12–46)
Lymphs Abs: 2 10*3/uL (ref 0.7–4.0)
MCH: 30.5 pg (ref 26.0–34.0)
MCHC: 33.3 g/dL (ref 30.0–36.0)
MCV: 91.5 fL (ref 78.0–100.0)
Monocytes Absolute: 1 10*3/uL (ref 0.1–1.0)
Monocytes Relative: 8 % (ref 3–12)
NEUTROS PCT: 72 % (ref 43–77)
Neutro Abs: 9.6 10*3/uL — ABNORMAL HIGH (ref 1.7–7.7)
PLATELETS: 603 10*3/uL — AB (ref 150–400)
RBC: 2.36 MIL/uL — AB (ref 4.22–5.81)
RDW: 19.2 % — ABNORMAL HIGH (ref 11.5–15.5)
WBC: 13.2 10*3/uL — AB (ref 4.0–10.5)

## 2014-09-15 LAB — LACTATE DEHYDROGENASE: LDH: 414 U/L — ABNORMAL HIGH (ref 98–192)

## 2014-09-15 MED ORDER — HYDROMORPHONE 0.3 MG/ML IV SOLN
INTRAVENOUS | Status: DC
  Administered 2014-09-15: 12:00:00 via INTRAVENOUS
  Administered 2014-09-15: 7.8 mg via INTRAVENOUS
  Administered 2014-09-15 – 2014-09-16 (×2): via INTRAVENOUS
  Administered 2014-09-16: 8.79 mg via INTRAVENOUS
  Administered 2014-09-16: 5.36 mg via INTRAVENOUS
  Administered 2014-09-16: 1 mg via INTRAVENOUS
  Administered 2014-09-16: 07:00:00 via INTRAVENOUS
  Filled 2014-09-15 (×4): qty 25

## 2014-09-15 MED ORDER — HYDROMORPHONE HCL 2 MG PO TABS
4.0000 mg | ORAL_TABLET | ORAL | Status: DC
  Administered 2014-09-15 – 2014-09-16 (×5): 4 mg via ORAL
  Filled 2014-09-15 (×6): qty 2

## 2014-09-15 MED ORDER — TECHNETIUM TO 99M ALBUMIN AGGREGATED
5.0000 | Freq: Once | INTRAVENOUS | Status: AC | PRN
  Administered 2014-09-15: 5 via INTRAVENOUS

## 2014-09-15 MED ORDER — TECHNETIUM TC 99M DIETHYLENETRIAME-PENTAACETIC ACID: 38.0000 | Freq: Once | INTRAVENOUS | Status: DC | PRN

## 2014-09-15 NOTE — Care Management Important Message (Signed)
Important Message  Patient Details  Name: [name] MRN: [id number] Date of Birth: 1980/04/22   Medicare Important Message Given:  Yes-second notification given    Shelda Altes 09/15/2014, 3:22 [place] Message  Patient Details  Name: [name] MRN: [id number] Date of Birth: Apr 22, 1980   Medicare Important Message Given:  Yes-second notification given    Shelda Altes 09/15/2014, 3:22 PM

## 2014-09-15 NOTE — Care Management Note (Signed)
Case Management Note  Patient Details  Name: [name] MRN: [id number] Date of Birth: 30-Oct-1980  Subjective/Objective:                    Action/Plan:d/c plan home.   Expected Discharge Date:   (unknown)               Expected Discharge Plan:  Home/Self Care  In-House Referral:     Discharge planning Services  CM Consult  Post Acute Care Choice:    Choice offered to:     DME Arranged:    DME Agency:     HH Arranged:    HH Agency:     Status of Service:  In process, will continue to follow  Medicare Important Message Given:  Yes-second notification given Date Medicare IM Given:    Medicare IM give by:    Date Additional Medicare IM Given:    Additional Medicare Important Message give by:     If discussed at [place] of Stay Meetings, dates discussed:    Additional Comments:  Dessa Phi, RN 09/15/2014, 3:33 PM

## 2014-09-15 NOTE — Progress Notes (Signed)
Ambulated with patient. 0.5 L O2 via [place] required to maintain pulse ox >92%. Lind Guest, RN

## 2014-09-15 NOTE — Progress Notes (Signed)
SICKLE CELL SERVICE PROGRESS NOTE  [name] NWG:[id number] DOB: 01-Oct-1980 DOA: 09/10/2014 PCP: Gerald Chessman, MD  Assessment/Plan: Principal Problem:   Hb-SS disease with crisis Active Problems:   Functional asplenia   Pulmonary HTN   Cor pulmonale, chronic   Chronic anticoagulation   Hypoxia   Anemia of chronic disease   Hx of pulmonary embolus   Essential hypertension   Chronic pain syndrome   Elevated troponin I level   PVC's (premature ventricular contractions)   Abnormal EKG   Hypoxemia   Acute respiratory failure with hypoxia   Acute chest syndrome in sickle crisis   Respiratory failure   Acute on chronic hypoxic respiratory failure: Pt is currently at baseline of 4L. I appreciate Cardiology input. I suspect that he probably had an ischemic event but is not a candidate for  Cath or stress test at present. I will check ambulatory pulse-ox. Pt has been counseled as he has in the past about the importance of wearing his Oxygen all the time. He has been non-compliant with this and I think he is just beginning to grasp the gravity of his chronic condition.  Leukocytosis: This is likely secondary to physiology of crisis. He has no signs of infection but in light of the acute chest and significant hypoxemia he has been placed empirically on broad spectrum antibiotics. He has no positive cultures in the last 4 days. Will discontinue Vancomycin and zosyn and treat for 2 more days with Augmentin.  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 Hb SS with crisis: Pain much better controlled. He has used 22.45 mg of IV dilaudid via PCA with 38/38:demands/deliveries. Will decrease IV Dilaudid dose via PCA  and schedule oral Dilaudid.    Hypokalemia: replaced orally and now Eukalemic.  Elevated Troponin:  Feel that this is likely related to [facility] and Ischemic demand. Continue ASA and Metoprolol.  Anemia of chronic Disease: Pt has known Anti-E and Anti-V  antibodies. He has been able to receive anti-C,E,Kell blood without any clinical sequela.  Chronic pain: Continue MS Contin  Medication adherence: Pt is supposed to be on Hydrea but MCV is not reflective of compliance. He has also been prescribed chelation therapy for high ferritin levels in the past but has chosen to discontinue medication due to difficulty in obtaining it.  Pulmonary Hypertension: Pt has severe pulmonary HTN with Chronic Right Sided systolic Heart Failure. The pathogenesis of PH complicating SCD is probably heterogeneous, including hemolysis and its effect on nitric oxide bioavailability (NO scavenging), asplenia, thrombosis, chronic lung disease, and iron overload. All of which this patient unfortunately has in his history. I will discuss further with Dr. Haroldine [name]- Cardiology. This typically heralds a poor prognosis for a patient with SCD.   Constipation: Last BM 4 days ago. However patient has not had solid food for 4 days.    Code Status: Discussed code status with patient witnessed by Nurse Gerald Rome, RN. Pt wants to be full code with CPR, intubation adn pressor support as necessary. However he dose not want prolonged ventilation is poor chances of recovery to a reasonable pint of independence. He states that he does not want to be a burden to his mother and does not want people coming to see him in a setting of being able to interact with them. In his absence of ability to speak for himself he has designated his mother to make decisions on his behalf. Will request a SW or Chaplin to assist with completing and  advance directive.    Code Status: Discussed code status with patient witnessed by Nurse Gerald Rome, RN. Pt wants to be full code with CPR, intubation adn pressor support as necessary. However he dose not want prolonged ventilation is poor chances of recovery to a reasonable pint of independence. He states that he does not want to be a burden to his mother  and does not want people coming to see him in a setting of being able to interact with them. In his absence of ability to speak for himself he has designated his mother [name])  to make decisions on his behalf.   Family Communication: N/A. Pt alert and oriented x 3. Disposition Plan: Anticipate discharge home likely tomorrow. [name] A.  Pager [phone]. If 7PM-7AM, please contact night-coverage.  09/15/2014, 11:46 AM  LOS: 5 days   Admitting History: Opiate tolerant patient with Hb SS who presented to the ED with pain in b/l ribs typical of sicke cell pain. Pt describes the pain as sharp and throbbing in nature and non-radiating. At it's greatest intensity. Pain was 10/10 and patient took oral Dilaudid without much relief. Thus he came to the ED. Of note, patient is supposed to be on MS Contin on a daily basis but has been unable to obtain prescription so has not had MS Contin since last week. He denies dizziness, Pre-cordial chest wall pain, pain with deep breathing, SOB, DOE, F/C, N/V/D. He also has known PAH and is chronically hypoxic with an Oxygen requirement of 3 L/min during activity and at night. However he has not been compliant wit the use of Oxygen.  Incidental findings during the course of his ED evaluation was an elevated Troponin of 3.48 without any significant EKG changes. He was also found to have an elevated BNP of 549.5 which is below his BNP Level of 1117 12 days go and chronically elevated pro-BNP as high as 5000 in the absence of N-STEMI. He was previously evaluated by Cardiology (07/23/2014) who recommended [facility] as an out-patient but patient has not yet been referred. He was also found to have an elevated D-dimer and a CT angiogram demonstrated chronic bi-lateral PE but no new PE. Please note that patient missed one dose of Lovenox yesterday but states that he has been compliant with it's use  otherwise  Consultants:  CCM  Cardiology  Procedures:  None  Antibiotics:  Vancomycin 8/10 >> 8/15  Zosyn 8/10 >> 8/15  HPI/Subjective: Pt states that his pain is much better controlled and he feels fine with regard to pain management.  He wants his diet changed to regular diet as he states that he usually eats fast food on a regular basis as he has no-one to cook for him. However he is willing to go along with the heart healthy diet for now. He started back eating solid food yesterday. He has not had a BM for 4 days.  Objective: Filed Vitals:   09/15/14 0400 09/15/14 0430 09/15/14 0745 09/15/14 1007  BP:  104/76  116/75  Pulse:  59  58  Temp:  98.2 F (36.8 C)  98.2 F (36.8 C)  TempSrc:  Oral  Oral  Resp: 17 11 13 14   Height:      Weight:      SpO2: 100% 100% 100% 100%   Weight change:   Intake/Output Summary (Last 24 hours) at 09/15/14 1146 Last data filed at 09/15/14 1006  Gross per 24 hour  Intake    590 ml  Output   2150 ml  Net  -1560 ml    General: Alert, awake, oriented x3, in no acute distress.  HEENT: Ama/AT PEERL, EOMI, anicteric Neck: Trachea midline,  no masses, no thyromegal,y no JVD, no carotid bruit OROPHARYNX:  Moist, No exudate/ erythema/lesions.  Heart: Regular rate and rhythm.. Loud P2 noted.  Lungs: Clear to auscultation, no wheezing or rhonchi noted. No increased work of breathing noted.  Abdomen: Soft, nontender, nondistended, positive bowel sounds, no masses no hepatosplenomegaly noted.  Neuro: No focal neurological deficits noted cranial nerves II through XII grossly intact.  Strength at functional baseline in bilateral upper and lower extremities. Musculoskeletal: No warm swelling or erythema around joints, no spinal tenderness noted. Psychiatric: Patient alert and oriented x3, good insight and cognition, good recent to remote recall.    Data Reviewed: Basic Metabolic Panel:  Recent Labs Lab 09/10/14 0645 09/11/14 0238  09/12/14 0830 09/13/14 0425 09/14/14 0600 09/15/14 0450  NA  --  139 138 137 138 137  K  --  4.0 3.9 4.5 4.3 4.0  CL  --  109 107 109 111 108  CO2  --  21* 22 23 22 23   GLUCOSE  --  109* 95 99 97 103*  BUN  --  10 11 14 13 12   CREATININE  --  0.91 1.04 1.07 0.93 0.83  CALCIUM  --  9.0 8.6* 8.6* 8.7* 8.7*  MG 1.6*  --   --   --   --   --   PHOS  --   --   --  4.5  --   --    Liver Function Tests:  Recent Labs Lab 09/10/14 0553 09/12/14 0830 09/13/14 0425 09/14/14 0600 09/15/14 0450  AST 41 37  --  28 28  ALT 27 22  --  18 16*  ALKPHOS 67 69  --  69 67  BILITOT 3.7* 3.9*  --  2.4* 2.4*  PROT 7.9 7.2  --  6.8 6.9  ALBUMIN 3.8 3.2* 3.3* 3.4* 3.2*   No results for input(s): LIPASE, AMYLASE in the last 168 hours. No results for input(s): AMMONIA in the last 168 hours. CBC:  Recent Labs Lab 09/11/14 0238 09/12/14 0830 09/13/14 0425 09/14/14 0600 09/15/14 0450  WBC 19.9* 16.4* 14.0* 12.6* 13.2*  NEUTROABS 15.0* 12.3* 9.1* 8.8* 9.6*  HGB 8.3* 7.9* 8.0* 7.3* 7.2*  HCT 23.6* 23.0* 23.5* 21.2* 21.6*  MCV 92.2 90.6 93.3 91.0 91.5  PLT 715* 644* 655* 634* 603*   Cardiac Enzymes:  Recent Labs Lab 09/10/14 0612 09/10/14 0957 09/10/14 1600 09/11/14 0238  TROPONINI 3.48* 6.34* 7.76* 5.79*   BNP (last 3 results)  Recent Labs  08/29/14 0810 09/10/14 0612  BNP 1117.0* 549.5*    ProBNP (last 3 results)  Recent Labs  01/01/14 0909  PROBNP 5790.0*    CBG:  Recent Labs Lab 09/11/14 1821  GLUCAP 115*    Recent Results (from the past 240 hour(s))  Culture, blood (routine x 2)     Status: None (Preliminary result)   Collection Time: 09/11/14  2:25 AM  Result Value Ref Range Status   Specimen Description BLOOD RIGHT HAND  Final   Special Requests BOTTLES DRAWN AEROBIC ONLY 10CC  Final   Culture   Final    NO GROWTH 3 DAYS Performed at [facility]    Report Status PENDING  Incomplete  Respiratory virus panel     Status: None   Collection  Time: 09/11/14  9:42  AM  Result Value Ref Range Status   Source - RVPAN NASOPHARYNGEAL SWAB  Corrected   Respiratory Syncytial Virus A Negative Negative Final   Respiratory Syncytial Virus B Negative Negative Final   Influenza A Negative Negative Final   Influenza B Negative Negative Final   Parainfluenza 1 Negative Negative Final   Parainfluenza 2 Negative Negative Final   Parainfluenza 3 Negative Negative Final   Metapneumovirus Negative Negative Final   Rhinovirus Negative Negative Final   Adenovirus Negative Negative Final    Comment: (NOTE) Performed At: [facility] [place], [place] [id number] Lindon Romp MD WU:[id number]      Studies: Dg Chest 1 View  09/10/2014   CLINICAL DATA:  Respiratory failure. Hypoxemia. Sickle cell patient.  EXAM: CHEST  1 VIEW  COMPARISON:  Radiographs and CT earlier today.  FINDINGS: 1813 hour. Stable cardiomegaly. The left subclavian Port-A-Cath is unchanged at the SVC right atrial junction. Stable chronic vascular congestion and basilar scarring. No superimposed edema, confluent airspace opacity or significant pleural effusion. 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  IMPRESSION: Stable examination compared with earlier study today. Chronic cardiomegaly and chronic vascular congestion.   Electronically Signed   By: Richardean Sale M.D.   On: 09/10/2014 18:27   Dg Chest 2 View  09/10/2014   CLINICAL DATA:  Acute onset of sickle cell pain crisis. Decreased O2 saturation. Worsening generalized weakness. Initial encounter.  EXAM: CHEST  2 VIEW  COMPARISON:  Chest radiograph from 08/29/2014  FINDINGS: The lungs are well-aerated. Mild vascular congestion is noted. Minimal bilateral atelectasis or scarring is noted. There is no evidence of pleural effusion or pneumothorax.  The heart is enlarged. A left-sided chest port is noted ending about the cavoatrial junction. No acute osseous abnormalities are seen. Clips are  noted within the right upper quadrant, reflecting prior cholecystectomy.  IMPRESSION: Mild vascular congestion and cardiomegaly noted. Minimal bilateral atelectasis or scarring noted.   Electronically Signed   By: Garald Balding M.D.   On: 09/10/2014 06:00   Dg Chest 2 View  08/29/2014   CLINICAL DATA:  Sickle cell crisis. Chest pain. Severe bilateral lower extremity pain.  EXAM: CHEST  2 VIEW  COMPARISON:  08/24/2014.  FINDINGS: No change. Artifact overlies the chest. Cardiac silhouette remains enlarged. Mediastinal shadows are otherwise normal. Power port inserted from the left subclavian vein has its tip in the SVC just above the right atrium. Vascular plethora remains present without edema or effusions. Pulmonary scarring remains present.  IMPRESSION: No acute finding. Cardiomegaly. Pulmonary vascular plethora. Pulmonary scarring.   Electronically Signed   By: Nelson Chimes M.D.   On: 08/29/2014 08:49   Dg Chest 2 View  08/24/2014   CLINICAL DATA:  Sickle cell crisis, shortness of breath  EXAM: CHEST  2 VIEW  COMPARISON:  08/05/2014  FINDINGS: Moderate enlargement of the cardiac silhouette is reidentified. Diffusely prominent interstitial markings are stable with areas of linear probable superimposed scarring. No pleural effusion. Cholecystectomy clips are noted. No acute osseous finding. Left-sided Port-A-Cath in place with tip at the cavoatrial junction.  IMPRESSION: No focal acute finding.   Electronically Signed   By: Conchita Paris M.D.   On: 08/24/2014 08:41   Dg Ankle Complete Left  08/24/2014   CLINICAL DATA:  Sickle cell crisis with left lower leg and ankle pain and swelling.  EXAM: LEFT ANKLE COMPLETE - 3+ VIEW  COMPARISON:  12/21/2006  FINDINGS: Diffuse soft tissue swelling is seen  surrounding the left ankle. No evidence of fracture, dislocation or bony destruction. The ankle mortise shows normal alignment. No bony lesions or visible bony infarcts by x-ray. No significant degenerative  changes.  IMPRESSION: Soft tissue swelling surrounding left ankle. No underlying bony abnormalities are identified.   Electronically Signed   By: Aletta Edouard M.D.   On: 08/24/2014 08:35   Ct Angio Chest Pe W/cm &/or Wo Cm  09/10/2014   CLINICAL DATA:  Short of breath.  EXAM: CT ANGIOGRAPHY CHEST WITH CONTRAST  TECHNIQUE: Multidetector CT imaging of the chest was performed using the standard protocol during bolus administration of intravenous contrast. Multiplanar CT image reconstructions and MIPs were obtained to evaluate the vascular anatomy.  CONTRAST:  152mL OMNIPAQUE IOHEXOL 350 MG/ML SOLN  COMPARISON:  08/29/2014  FINDINGS: Mediastinum: There is moderate cardiac enlargement. No pericardial effusion. The trachea is patent and appears midline. Normal appearance of the esophagus. Prominent soft tissue is identified within the anterior mediastinum. Stress set prominent anterior mediastinal adenopathy is again identified. Index pre-vascular lymph node measures 1 cm, image 35/series 4. Previously 1.2 cm. Bilateral lower lobe pulmonary artery filling defects are again noted and are in the same distribution as the previous exam. No new pulmonary emboli identified.  Lungs/Pleura: There is no pleural effusion. Mosaic attenuation pattern is identified throughout both lungs compatible with pulmonary emboli. No airspace consolidation. Scarring is noted predominantly involving the lower lobes.  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 Diffuse increased attenuation throughout the liver is noted. The visualized adrenal glands are unremarkable.  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  Review of the MIP images confirms the above findings.  IMPRESSION: 1. Again noted are chronic bilateral lower lobe pulmonary emboli. No new areas of pulmonary emboli identified. 2. Cardiac  enlargement 3. Pulmonary and osseous stigmata of sickle cell disease.   Electronically Signed   By: Kerby Moors M.D.   On: 09/10/2014 09:00   Ct Angio Chest Pe W/cm &/or Wo Cm  08/29/2014   CLINICAL DATA:  Chest pain.  Sickle cell.  Recent pulmonary embolism  EXAM: CT ANGIOGRAPHY CHEST WITH CONTRAST  TECHNIQUE: Multidetector CT imaging of the chest was performed using the standard protocol during bolus administration of intravenous contrast. Multiplanar CT image reconstructions and MIPs were obtained to evaluate the vascular anatomy.  CONTRAST:  150mL OMNIPAQUE IOHEXOL 350 MG/ML SOLN  COMPARISON:  CT chest 07/20/2014  FINDINGS: Small filling defects in the lower lobe pulmonary arteries are unchanged from the prior study and consistent with residual pulmonary emboli which are not dissolved. No new emboli identified. It is unlikely these are recurrent emboli as the are in the exact same location as the prior study.  Cardiac enlargement without pericardial effusion. Right ventricular enlargement. RV/LV ratio is 1.5 suggesting right heart strain. Pulmonary arteries are enlarged.  Port-A-Cath in the SVC unchanged.  Because  Anterior mediastinal adenopathy measuring up to 12 mm unchanged. No hilar adenopathy.  Patchy airspace densities in the posterior lung bases bilaterally unchanged most consistent with atelectasis or scarring. Additional ground-glass density in the lungs bilaterally most notably in the right lung are unchanged and may be related to chronic lung disease or pulmonary emboli.  Upper abdomen negative.  Negative for   effusion.  Review of the MIP images confirms the above findings.  IMPRESSION: Small filling defects in the lower lobe pulmonary arteries unchanged from the CT of 07/20/2014 compatible  with residual emboli which have not dissolved. No new emboli identified.  Cardiac enlargement. Pulmonary artery enlargement. Enlarged right ventricle. RV/LV ratio 1.5 consistent with right ventricle strain  likely due to pulmonary hypertension.  Critical Value/emergent results were called by telephone at the time of interpretation on 08/29/2014 at 10:28 am to Dr. Gareth Morgan , who verbally acknowledged these results.   Electronically Signed   By: Franchot Gallo M.D.   On: 08/29/2014 10:28   Dg Chest 1v Repeat Same Day  08/29/2014   CLINICAL DATA:  34 year old male with shortness of breath for 1 day. History sickle cell disease.  EXAM: CHEST - 1 VIEW SAME DAY  COMPARISON:  10/21/2014 and prior radiographs  FINDINGS: Cardiomegaly and left subclavian Port-A-Cath with tip overlying the superior cavoatrial junction again noted.  Increased pulmonary vascularity noted.  There is no evidence of focal airspace disease, pulmonary edema, suspicious pulmonary nodule/mass, pleural effusion, or pneumothorax. No acute bony abnormalities are identified.  IMPRESSION: Cardiomegaly and increased pulmonary vascularity without acute abnormality.   Electronically Signed   By: Margarette Canada M.D.   On: 08/29/2014 19:13   Dg Chest Port 1 View  09/12/2014   CLINICAL DATA:  Respiratory failure.  EXAM: PORTABLE CHEST - 1 VIEW  COMPARISON:  Chest radiograph from 1 day prior.  FINDINGS: Left subclavian central venous catheter terminates near the cavoatrial junction. Stable cardiomediastinal silhouette with mild to moderate cardiomegaly. No pneumothorax. No pleural effusion. There is stable mild to moderate pulmonary edema. No new focal lung opacity. Stable mild scarring versus atelectasis in the mid to lower left lung.  IMPRESSION: 1. Stable mild to moderate congestive heart failure. 2. Stable mild scarring versus atelectasis in the basilar left lung.   Electronically Signed   By: Ilona Sorrel M.D.   On: 09/12/2014 09:32   Dg Chest Port 1 View  09/11/2014   CLINICAL DATA:  Respiratory failure.  EXAM: PORTABLE CHEST - 1 VIEW  COMPARISON:  09/10/2014.  FINDINGS: PowerPort catheter noted in good anatomic position. Mediastinum and hilar  structures normal. Cardiomegaly with mild pulmonary vascular prominence and interstitial prominence. Mild component of congestive heart failure cannot be excluded. No pleural effusion or pneumothorax.  IMPRESSION: 1. Power port catheter noted with tip in stable position.  2. Severe cardiomegaly again noted. Mild pulmonary vascular prominence interstitial prominence. Mild component of congestive heart failure cannot be excluded.   Electronically Signed   By: Marcello Moores  Register   On: 09/11/2014 07:14    Scheduled Meds: . antiseptic oral rinse  7 mL Mouth Rinse q12n4p  . aspirin  81 mg Oral Daily  . chlorhexidine  15 mL Mouth Rinse BID  . cholecalciferol  2,000 Units Oral Daily  . diltiazem  120 mg Oral Daily  . enoxaparin (LOVENOX) injection  100 mg Subcutaneous Q24H  . folic acid  1 mg Oral q morning - 10a  . HYDROmorphone PCA 0.3 mg/mL   Intravenous 6 times per day  . HYDROmorphone  4 mg Oral Q4H  . hydroxyurea  1,500 mg Oral Daily  . lisinopril  10 mg Oral Daily  . metoprolol tartrate  25 mg Oral BID  . morphine  30 mg Oral Q12H  . mupirocin ointment  1 application Topical BID  . piperacillin-tazobactam (ZOSYN)  IV  3.375 g Intravenous 3 times per day  . potassium chloride SA  20 mEq Oral q morning - 10a  . senna-docusate  1 tablet Oral BID  . sodium chloride  3 mL Intravenous  Q12H  . vancomycin  1,000 mg Intravenous Q12H   Continuous Infusions:    Time spent 30 minutes.

## 2014-09-15 NOTE — Progress Notes (Signed)
ANTIBIOTIC CONSULT NOTE  Pharmacy Consult for Vancomycin and Zosyn Indication: Suspected HCAP  No Known Allergies  Patient Measurements: Height: 6' (182.9 cm) Weight: 157 lb 6.5 oz (71.4 kg) IBW/kg (Calculated) : 77.6  Vital Signs: Temp: 98.2 F (36.8 C) (08/15 0430) Temp Source: Oral (08/15 0430) BP: 104/76 mmHg (08/15 0430) Pulse Rate: 59 (08/15 0430) Intake/Output from previous day: 08/14 0701 - 08/15 0700 In: 760 [P.O.:360; IV Piggyback:400] Out: 2125 [Urine:2125] Intake/Output from this shift:    Labs:  Recent Labs  09/13/14 0425 09/14/14 0600 09/15/14 0450  WBC 14.0* 12.6* 13.2*  HGB 8.0* 7.3* 7.2*  PLT 655* 634* 603*  CREATININE 1.07 0.93 0.83   Estimated Creatinine Clearance: 125.5 mL/min (by C-G formula based on Cr of 0.83).  Recent Labs  09/12/14 1315  [place] 24*     Microbiology: Recent Results (from the past 720 hour(s))  MRSA PCR Screening     Status: None   Collection Time: 08/29/14 11:35 AM  Result Value Ref Range Status   MRSA by PCR NEGATIVE NEGATIVE Final    Comment:        The GeneXpert MRSA Assay (FDA approved for NASAL specimens only), is one component of a comprehensive MRSA colonization surveillance program. It is not intended to diagnose MRSA infection nor to guide or monitor treatment for MRSA infections.   Culture, blood (routine x 2)     Status: None   Collection Time: 08/29/14  9:30 PM  Result Value Ref Range Status   Specimen Description BLOOD RIGHT WRIST  Final   Special Requests IN PEDIATRIC BOTTLE 3ML  Final   Culture  Setup Time   Final    GRAM POSITIVE COCCI IN CLUSTERS GRAM NEGATIVE COCCOBACILLI AEROBIC BOTTLE ONLY CRITICAL RESULT CALLED TO, READ BACK BY AND VERIFIED WITH: E PELLITIER,RN AT 0102 09/01/14 BY L BENFIELD    Culture   Final    ROSEOMONAS SPECIES Standardized susceptibility testing for this organism is not available. STAPHYLOCOCCUS SPECIES (COAGULASE NEGATIVE) THE SIGNIFICANCE OF ISOLATING  THIS ORGANISM FROM A SINGLE SET OF BLOOD CULTURES WHEN MULTIPLE SETS ARE DRAWN IS UNCERTAIN. PLEASE NOTIFY THE MICROBIOLOGY DEPARTMENT WITHIN ONE WEEK IF SPECIATION AND SENSITIVITIES ARE REQUIRED. Performed at [facility]    Report Status 09/05/2014 FINAL  Final  Culture, blood (routine x 2)     Status: None   Collection Time: 08/29/14  9:39 PM  Result Value Ref Range Status   Specimen Description BLOOD BLOOD RIGHT HAND  Final   Special Requests IN PEDIATRIC BOTTLE 3ML  Final   Culture   Final    NO GROWTH 5 DAYS Performed at [facility]    Report Status 09/04/2014 FINAL  Final  Culture, blood (routine x 2)     Status: None   Collection Time: 09/01/14  1:40 PM  Result Value Ref Range Status   Specimen Description BLOOD RIGHT HAND  Final   Special Requests IN PEDIATRIC BOTTLE 4CC  Final   Culture   Final    NO GROWTH 5 DAYS Performed at [facility]    Report Status 09/06/2014 FINAL  Final  Culture, blood (routine x 2)     Status: None   Collection Time: 09/01/14  1:50 PM  Result Value Ref Range Status   Specimen Description BLOOD RIGHT ARM  Final   Special Requests IN PEDIATRIC BOTTLE 4CC  Final   Culture   Final    NO GROWTH 5 DAYS Performed at [facility]  Report Status 09/06/2014 FINAL  Final  Culture, blood (routine x 2)     Status: None (Preliminary result)   Collection Time: 09/11/14  2:25 AM  Result Value Ref Range Status   Specimen Description BLOOD RIGHT HAND  Final   Special Requests BOTTLES DRAWN AEROBIC ONLY 10CC  Final   Culture   Final    NO GROWTH 3 DAYS Performed at [facility]    Report Status PENDING  Incomplete  Respiratory virus panel     Status: None   Collection Time: 09/11/14  9:42 AM  Result Value Ref Range Status   Source - RVPAN NASOPHARYNGEAL SWAB  Corrected   Respiratory Syncytial Virus A Negative Negative Final   Respiratory Syncytial Virus B Negative Negative Final   Influenza A Negative Negative  Final   Influenza B Negative Negative Final   Parainfluenza 1 Negative Negative Final   Parainfluenza 2 Negative Negative Final   Parainfluenza 3 Negative Negative Final   Metapneumovirus Negative Negative Final   Rhinovirus Negative Negative Final   Adenovirus Negative Negative Final    Comment: (NOTE) Performed At: [facility] [address] [place], [place] [id number] Lindon Romp MD DD:[id number]     Medical History: Past Medical History  Diagnosis Date  . Sickle cell anemia   . Blood transfusion   . Acute embolism and thrombosis of right internal jugular vein   . Hypokalemia   . Mood disorder   . History of pulmonary embolus (PE)   . Avascular necrosis   . Leukocytosis     Chronic  . Thrombocytosis     Chronic  . Hypertension   . History of Clostridium difficile infection   . Uses marijuana   . Chronic anticoagulation   . Functional asplenia   . Former smoker   . Second hand tobacco smoke exposure   . Alcohol consumption of one to four drinks per day   . Noncompliance with medication regimen   . Sickle-cell crisis with associated acute chest syndrome 05/13/2013  . Acute chest syndrome 06/18/2013  . Demand ischemia 01/02/2014    Medications:  Anti-infectives    Start     Dose/Rate Route Frequency Ordered Stop   09/12/14 1800  vancomycin (VANCOCIN) IVPB 1000 mg/200 mL premix     1,000 mg 200 mL/hr over 60 Minutes Intravenous Every 12 hours 09/12/14 1500     09/11/14 0600  piperacillin-tazobactam (ZOSYN) IVPB 3.375 g     3.375 g 12.5 mL/hr over 240 Minutes Intravenous 3 times per day 09/10/14 2010     09/10/14 2200  vancomycin (VANCOCIN) IVPB 1000 mg/200 mL premix  Status:  Discontinued     1,000 mg 200 mL/hr over 60 Minutes Intravenous Every 8 hours 09/10/14 2010 09/12/14 1500   09/10/14 2100  piperacillin-tazobactam (ZOSYN) IVPB 3.375 g     3.375 g 100 mL/hr over 30 Minutes Intravenous  Once 09/10/14 2010 09/11/14 0128     Assessment: 34yo M  admitted w/ sickle cell pain crisis, developing dyspnea and hypoxia. He is currently on day#5 empiric, broad-spectrum antibiotics for possible HCAP.  He remains afebrile and is clinically improving.   Patient's Scr has returned to baseline and UOP remains good. Vancomycin trough was supra-therapeutic on 8/12 and dose reduced.   Vanc 8/10 >> Zosyn 8/10 >>  8/12 VT = 24 mcg/mL on 1g q8 >> 1g q12 (SCr 1.04)  8/11 RVP - negative 8/1 BCx x2 - NGTD  Goal of Therapy:  Vancomycin trough level  15-20 mcg/ml  Appropriate antibiotic dosing for renal function; eradication of infection  Plan:  Continue Zosyn 3.375g IV Q8H infused over 4hrs. Continue Vancomycin 1g IV q12h. Recheck Vanc trough at steady state -->defer repeat trough for now since anticipate will d/c soon. Follow up renal fxn, culture results, and clinical course. Duration of therapy per MD- recommend de-escalate antibiotics  Netta Cedars, PharmD, BCPS Pager: [phone]   09/15/2014,8:13 AM.

## 2014-09-16 ENCOUNTER — Other Ambulatory Visit: Payer: Self-pay | Admitting: Internal Medicine

## 2014-09-16 DIAGNOSIS — D5700 Hb-SS disease with crisis, unspecified: Secondary | ICD-10-CM | POA: Diagnosis not present

## 2014-09-16 LAB — CBC WITH DIFFERENTIAL/PLATELET
Basophils Absolute: 0.1 10*3/uL (ref 0.0–0.1)
Basophils Relative: 1 % (ref 0–1)
EOS ABS: 0.4 10*3/uL (ref 0.0–0.7)
Eosinophils Relative: 3 % (ref 0–5)
HEMATOCRIT: 22.2 % — AB (ref 39.0–52.0)
HEMOGLOBIN: 7.6 g/dL — AB (ref 13.0–17.0)
LYMPHS ABS: 2.4 10*3/uL (ref 0.7–4.0)
LYMPHS PCT: 17 % (ref 12–46)
MCH: 31.5 pg (ref 26.0–34.0)
MCHC: 34.2 g/dL (ref 30.0–36.0)
MCV: 92.1 fL (ref 78.0–100.0)
MONOS PCT: 8 % (ref 3–12)
Monocytes Absolute: 1.1 10*3/uL — ABNORMAL HIGH (ref 0.1–1.0)
NEUTROS PCT: 71 % (ref 43–77)
Neutro Abs: 10.2 10*3/uL — ABNORMAL HIGH (ref 1.7–7.7)
Platelets: 641 10*3/uL — ABNORMAL HIGH (ref 150–400)
RBC: 2.41 MIL/uL — AB (ref 4.22–5.81)
RDW: 19.2 % — ABNORMAL HIGH (ref 11.5–15.5)
WBC: 14.2 10*3/uL — AB (ref 4.0–10.5)

## 2014-09-16 LAB — CULTURE, BLOOD (ROUTINE X 2)
Culture: NO GROWTH
Special Requests: NORMAL

## 2014-09-16 LAB — RETICULOCYTES
RBC.: 2.41 MIL/uL — ABNORMAL LOW (ref 4.22–5.81)
Retic Count, Absolute: 115.7 10*3/uL (ref 19.0–186.0)
Retic Ct Pct: 4.8 % — ABNORMAL HIGH (ref 0.4–3.1)

## 2014-09-16 MED ORDER — HYDROXYUREA 500 MG PO CAPS: 1500.0000 mg | ORAL_CAPSULE | Freq: Every day | ORAL | Status: DC

## 2014-09-16 MED ORDER — HEPARIN SOD (PORK) LOCK FLUSH 100 UNIT/ML IV SOLN
500.0000 [IU] | INTRAVENOUS | Status: AC | PRN
  Administered 2014-09-16: 500 [IU]

## 2014-09-16 NOTE — Progress Notes (Signed)
SATURATION QUALIFICATIONS: (This note is used to comply with regulatory documentation for home oxygen)  Patient Saturations on Room Air at Rest = 90%  Patient Saturations on Room Air while Ambulating = 84%  Patient Saturations on 2 Liters of oxygen while Ambulating = 94-95%  Please briefly explain why patient needs home oxygen:

## 2014-09-16 NOTE — Care Management Note (Signed)
Case Management Note  Patient Details  Name: [name] MRN: [id number] Date of Birth: 13-Dec-1980  Subjective/Objective:   Patient active w/[facility] dme home 02.Patient doesn't have travel home 02 tank in hospital, TC [facility] dme rep Pura Spice to bring tank to patient's rm, she will also address patient's concerns about the process for refills of 02 tanks, & how to turn on 02 tank.Patient agreed to conversation w/[facility].                 Action/Plan:d/c plan home.No further d/c needs or orders.   Expected Discharge Date:   (unknown)               Expected Discharge Plan:  Home/Self Care  In-House Referral:     Discharge planning Services  CM Consult  Post Acute Care Choice:    Choice offered to:     DME Arranged:    DME Agency:     HH Arranged:    [place] Agency:     Status of Service:  Completed, signed off  Medicare Important Message Given:  Yes-second notification given Date Medicare IM Given:    Medicare IM give by:    Date Additional Medicare IM Given:    Additional Medicare Important Message give by:     If discussed at [place] of Stay Meetings, dates discussed:    Additional Comments:  Dessa Phi, RN 09/16/2014, 12:46 PM

## 2014-09-16 NOTE — Discharge Summary (Signed)
[name] MRN: [id number] DOB/AGE: 1980/05/29 34 y.o.  Admit date: 09/10/2014 Discharge date: 09/16/2014  Primary Care Physician:  [name], MD   Discharge Diagnoses:   Patient Active Problem List   Diagnosis Date Noted  . Atrial fibrillation with RVR 08/29/2014    Priority: High  . Cor pulmonale, chronic 08/25/2014    Priority: High  . Pulmonary HTN 06/18/2013    Priority: High  . Functional asplenia     Priority: High  . Anemia of chronic disease 06/25/2014    Priority: Medium  . Hypoxia 03/14/2014    Priority: Medium  . Hb-SS disease with crisis 01/22/2014    Priority: Medium  . Chronic anticoagulation 08/22/2013    Priority: Medium  . Avascular necrosis     Priority: Low  . Respiratory failure   . Acute respiratory failure with hypoxia   . Acute chest syndrome in sickle crisis   . PVC's (premature ventricular contractions) 09/10/2014  . Abnormal EKG 09/10/2014  . Hypoxemia   . Chronic atrial fibrillation 09/09/2014  . Sickle cell anemia with pain 09/09/2014  . Sickle cell crisis 09/08/2014  . Paronychia of great toe, left   . Bacteremia   . Leukocytosis   . Elevated troponin I level 08/29/2014  . Hypokalemia 08/29/2014  . Ankle edema 07/07/2014  . Sickle cell anemia 06/25/2014  . PAH (pulmonary artery hypertension) 03/18/2014  . Paralytic strabismus, external ophthalmoplegia   . Chronic pain syndrome 12/12/2013  . Essential hypertension 08/22/2013  . Vitamin D deficiency 02/13/2013  . Uses marijuana 01/13/2013  . Embolism, pulmonary with infarction 07/09/2012  . Hx of pulmonary embolus 06/29/2012  . Hemochromatosis 12/14/2011    DISCHARGE MEDICATION:   Medication List    STOP taking these medications        amoxicillin-clavulanate 875-125 MG per tablet  Commonly known as:  AUGMENTIN      TAKE these medications        aspirin 81 MG chewable tablet  Chew 1 tablet (81 mg total) by mouth daily.     diltiazem 120 MG 24 hr capsule   Commonly known as:  CARDIZEM CD  Take 1 capsule (120 mg total) by mouth daily.     enoxaparin 150 MG/ML injection  Commonly known as:  LOVENOX  Inject 0.73 mLs (110 mg total) into the skin daily. Starting tonight 07/23/2014 at 29:52WU     folic acid 1 MG tablet  Commonly known as:  FOLVITE  Take 1 tablet (1 mg total) by mouth every morning.     HYDROmorphone 4 MG tablet  Commonly known as:  DILAUDID  Take 1 tablet (4 mg total) by mouth every 4 (four) hours as needed for severe pain.     hydroxyurea 500 MG capsule  Commonly known as:  HYDREA  Take 3 capsules (1,500 mg total) by mouth daily. May take with food to minimize GI side effects.     lisinopril 10 MG tablet  Commonly known as:  PRINIVIL,ZESTRIL  Take 1 tablet (10 mg total) by mouth daily.     metoprolol tartrate 25 MG tablet  Commonly known as:  LOPRESSOR  Take 1 tablet (25 mg total) by mouth daily.     morphine 30 MG 12 hr tablet  Commonly known as:  MS CONTIN  Take 1 tablet (30 mg total) by mouth every 12 (twelve) hours.     mupirocin ointment 2 %  Commonly known as:  BACTROBAN  Apply 1 application topically 2 (two) times daily.  Apply ointment to left great toe twice a day and apply dry dressing     potassium chloride SA 20 MEQ tablet  Commonly known as:  K-DUR,KLOR-CON  Take 1 tablet (20 mEq total) by mouth every morning.     Vitamin D 2000 UNITS tablet  Take 1 tablet (2,000 Units total) by mouth daily.     zolpidem 10 MG tablet  Commonly known as:  AMBIEN  Take 1 tablet (10 mg total) by mouth at bedtime as needed for sleep.          Consults: Treatment Team:  Reubin Milan, MD [name], MD   SIGNIFICANT DIAGNOSTIC STUDIES:  Dg Chest 1 View  09/10/2014   CLINICAL DATA:  Respiratory failure. Hypoxemia. Sickle cell patient.  EXAM: CHEST  1 VIEW  COMPARISON:  Radiographs and CT earlier today.  FINDINGS: 1813 hour. Stable cardiomegaly. The left subclavian Port-A-Cath is unchanged at the  SVC right atrial junction. Stable chronic vascular congestion and basilar scarring. No superimposed edema, confluent airspace opacity or significant pleural effusion. 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  IMPRESSION: Stable examination compared with earlier study today. Chronic cardiomegaly and chronic vascular congestion.   Electronically Signed   By: Richardean Sale M.D.   On: 09/10/2014 18:27   Dg Chest 2 View  09/10/2014   CLINICAL DATA:  Acute onset of sickle cell pain crisis. Decreased O2 saturation. Worsening generalized weakness. Initial encounter.  EXAM: CHEST  2 VIEW  COMPARISON:  Chest radiograph from 08/29/2014  FINDINGS: The lungs are well-aerated. Mild vascular congestion is noted. Minimal bilateral atelectasis or scarring is noted. There is no evidence of pleural effusion or pneumothorax.  The heart is enlarged. A left-sided chest port is noted ending about the cavoatrial junction. No acute osseous abnormalities are seen. Clips are noted within the right upper quadrant, reflecting prior cholecystectomy.  IMPRESSION: Mild vascular congestion and cardiomegaly noted. Minimal bilateral atelectasis or scarring noted.   Electronically Signed   By: Garald Balding M.D.   On: 09/10/2014 06:00   Dg Chest 2 View  08/29/2014   CLINICAL DATA:  Sickle cell crisis. Chest pain. Severe bilateral lower extremity pain.  EXAM: CHEST  2 VIEW  COMPARISON:  08/24/2014.  FINDINGS: No change. Artifact overlies the chest. Cardiac silhouette remains enlarged. Mediastinal shadows are otherwise normal. Power port inserted from the left subclavian vein has its tip in the SVC just above the right atrium. Vascular plethora remains present without edema or effusions. Pulmonary scarring remains present.  IMPRESSION: No acute finding. Cardiomegaly. Pulmonary vascular plethora. Pulmonary scarring.   Electronically Signed   By: Nelson Chimes M.D.   On: 08/29/2014 08:49   Dg Chest 2 View  08/24/2014    CLINICAL DATA:  Sickle cell crisis, shortness of breath  EXAM: CHEST  2 VIEW  COMPARISON:  08/05/2014  FINDINGS: Moderate enlargement of the cardiac silhouette is reidentified. Diffusely prominent interstitial markings are stable with areas of linear probable superimposed scarring. No pleural effusion. Cholecystectomy clips are noted. No acute osseous finding. Left-sided Port-A-Cath in place with tip at the cavoatrial junction.  IMPRESSION: No focal acute finding.   Electronically Signed   By: Conchita Paris M.D.   On: 08/24/2014 08:41   Dg Ankle Complete Left  08/24/2014   CLINICAL DATA:  Sickle cell crisis with left lower leg and ankle pain and swelling.  EXAM: LEFT ANKLE COMPLETE - 3+ VIEW  COMPARISON:  12/21/2006  FINDINGS: Diffuse soft tissue swelling  is seen surrounding the left ankle. No evidence of fracture, dislocation or bony destruction. The ankle mortise shows normal alignment. No bony lesions or visible bony infarcts by x-ray. No significant degenerative changes.  IMPRESSION: Soft tissue swelling surrounding left ankle. No underlying bony abnormalities are identified.   Electronically Signed   By: Aletta Edouard M.D.   On: 08/24/2014 08:35   Ct Angio Chest Pe W/cm &/or Wo Cm  09/10/2014   CLINICAL DATA:  Short of breath.  EXAM: CT ANGIOGRAPHY CHEST WITH CONTRAST  TECHNIQUE: Multidetector CT imaging of the chest was performed using the standard protocol during bolus administration of intravenous contrast. Multiplanar CT image reconstructions and MIPs were obtained to evaluate the vascular anatomy.  CONTRAST:  159mL OMNIPAQUE IOHEXOL 350 MG/ML SOLN  COMPARISON:  08/29/2014  FINDINGS: Mediastinum: There is moderate cardiac enlargement. No pericardial effusion. The trachea is patent and appears midline. Normal appearance of the esophagus. Prominent soft tissue is identified within the anterior mediastinum. Stress set prominent anterior mediastinal adenopathy is again identified. Index pre-vascular  lymph node measures 1 cm, image 35/series 4. Previously 1.2 cm. Bilateral lower lobe pulmonary artery filling defects are again noted and are in the same distribution as the previous exam. No new pulmonary emboli identified.  Lungs/Pleura: There is no pleural effusion. Mosaic attenuation pattern is identified throughout both lungs compatible with pulmonary emboli. No airspace consolidation. Scarring is noted predominantly involving the lower lobes.  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 Diffuse increased attenuation throughout the liver is noted. The visualized adrenal glands are unremarkable.  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  Review of the MIP images confirms the above findings.  IMPRESSION: 1. Again noted are chronic bilateral lower lobe pulmonary emboli. No new areas of pulmonary emboli identified. 2. Cardiac enlargement 3. Pulmonary and osseous stigmata of sickle cell disease.   Electronically Signed   By: Kerby Moors M.D.   On: 09/10/2014 09:00   Ct Angio Chest Pe W/cm &/or Wo Cm  08/29/2014   CLINICAL DATA:  Chest pain.  Sickle cell.  Recent pulmonary embolism  EXAM: CT ANGIOGRAPHY CHEST WITH CONTRAST  TECHNIQUE: Multidetector CT imaging of the chest was performed using the standard protocol during bolus administration of intravenous contrast. Multiplanar CT image reconstructions and MIPs were obtained to evaluate the vascular anatomy.  CONTRAST:  128mL OMNIPAQUE IOHEXOL 350 MG/ML SOLN  COMPARISON:  CT chest 07/20/2014  FINDINGS: Small filling defects in the lower lobe pulmonary arteries are unchanged from the prior study and consistent with residual pulmonary emboli which are not dissolved. No new emboli identified. It is unlikely these are recurrent emboli as the are in the exact same location as the prior study.  Cardiac enlargement without  pericardial effusion. Right ventricular enlargement. RV/LV ratio is 1.5 suggesting right heart strain. Pulmonary arteries are enlarged.  Port-A-Cath in the SVC unchanged.  Because  Anterior mediastinal adenopathy measuring up to 12 mm unchanged. No hilar adenopathy.  Patchy airspace densities in the posterior lung bases bilaterally unchanged most consistent with atelectasis or scarring. Additional ground-glass density in the lungs bilaterally most notably in the right lung are unchanged and may be related to chronic lung disease or pulmonary emboli.  Upper abdomen negative.  Negative for   effusion.  Review of the MIP images confirms the above findings.  IMPRESSION: Small filling defects in the lower lobe pulmonary arteries unchanged from the CT of  07/20/2014 compatible with residual emboli which have not dissolved. No new emboli identified.  Cardiac enlargement. Pulmonary artery enlargement. Enlarged right ventricle. RV/LV ratio 1.5 consistent with right ventricle strain likely due to pulmonary hypertension.  Critical Value/emergent results were called by telephone at the time of interpretation on 08/29/2014 at 10:28 am to Dr. Gareth Morgan , who verbally acknowledged these results.   Electronically Signed   By: Franchot Gallo M.D.   On: 08/29/2014 10:28   Nm Pulmonary Perf And Vent  09/15/2014   CLINICAL DATA:  Hypoxia. Pulmonary embolus on demonstrated on CT of 09/10/2014.  EXAM: NUCLEAR MEDICINE VENTILATION - PERFUSION LUNG SCAN  TECHNIQUE: Ventilation images were obtained in multiple projections using inhaled aerosol Tc-66m DTPA. Perfusion images were obtained in multiple projections after intravenous injection of Tc-49m MAA.  RADIOPHARMACEUTICALS:  Thirty-eight mCi Technetium-82m DTPA aerosol inhalation and 5 mCi Technetium-98m MAA IV  COMPARISON:  CT 09/18/2014  FINDINGS: Ventilation: There is moderate decreased ventilation to the LEFT lower lobe posteriorly.  Perfusion: There is decreased regional  perfusion to the LEFT lower lobe posteriorly which matches the ventilation defect but is slightly more pronounced and corresponds with chronic pulmonary emboli seen on comparison CT. There additional small peripheral perfusion defects in the LEFT and RIGHT lungs.  IMPRESSION: 1. No clear evidence of acute pulmonary embolism. 2. Decreased perfusion and ventilation to the LEFT lower lobe with the profusion decreased to a greater degree than the ventilation corresponds to the chronic pulmonary emboli seen on comparison CT. 3. Multiple small peripheral perfusion defects also consistent chronic PE.   Electronically Signed   By: Suzy Bouchard M.D.   On: 09/15/2014 16:24   Dg Chest 1v Repeat Same Day  08/29/2014   CLINICAL DATA:  34 year old male with shortness of breath for 1 day. History sickle cell disease.  EXAM: CHEST - 1 VIEW SAME DAY  COMPARISON:  10/21/2014 and prior radiographs  FINDINGS: Cardiomegaly and left subclavian Port-A-Cath with tip overlying the superior cavoatrial junction again noted.  Increased pulmonary vascularity noted.  There is no evidence of focal airspace disease, pulmonary edema, suspicious pulmonary nodule/mass, pleural effusion, or pneumothorax. No acute bony abnormalities are identified.  IMPRESSION: Cardiomegaly and increased pulmonary vascularity without acute abnormality.   Electronically Signed   By: Margarette Canada M.D.   On: 08/29/2014 19:13   Dg Chest Port 1 View  09/12/2014   CLINICAL DATA:  Respiratory failure.  EXAM: PORTABLE CHEST - 1 VIEW  COMPARISON:  Chest radiograph from 1 day prior.  FINDINGS: Left subclavian central venous catheter terminates near the cavoatrial junction. Stable cardiomediastinal silhouette with mild to moderate cardiomegaly. No pneumothorax. No pleural effusion. There is stable mild to moderate pulmonary edema. No new focal lung opacity. Stable mild scarring versus atelectasis in the mid to lower left lung.  IMPRESSION: 1. Stable mild to moderate  congestive heart failure. 2. Stable mild scarring versus atelectasis in the basilar left lung.   Electronically Signed   By: Ilona Sorrel M.D.   On: 09/12/2014 09:32   Dg Chest Port 1 View  09/11/2014   CLINICAL DATA:  Respiratory failure.  EXAM: PORTABLE CHEST - 1 VIEW  COMPARISON:  09/10/2014.  FINDINGS: PowerPort catheter noted in good anatomic position. Mediastinum and hilar structures normal. Cardiomegaly with mild pulmonary vascular prominence and interstitial prominence. Mild component of congestive heart failure cannot be excluded. No pleural effusion or pneumothorax.  IMPRESSION: 1. Power port catheter noted with tip in stable position.  2. Severe cardiomegaly again  noted. Mild pulmonary vascular prominence interstitial prominence. Mild component of congestive heart failure cannot be excluded.   Electronically Signed   By: Marcello Moores  Register   On: 09/11/2014 07:14      Recent Results (from the past 240 hour(s))  Culture, blood (routine x 2)     Status: None   Collection Time: 09/11/14  2:25 AM  Result Value Ref Range Status   Specimen Description BLOOD RIGHT HAND  Final   Special Requests BOTTLES DRAWN AEROBIC ONLY 10CC  Final   Culture   Final    NO GROWTH 5 DAYS Performed at [facility]    Report Status 09/16/2014 FINAL  Final  Respiratory virus panel     Status: None   Collection Time: 09/11/14  9:42 AM  Result Value Ref Range Status   Source - RVPAN NASOPHARYNGEAL SWAB  Corrected   Respiratory Syncytial Virus A Negative Negative Final   Respiratory Syncytial Virus B Negative Negative Final   Influenza A Negative Negative Final   Influenza B Negative Negative Final   Parainfluenza 1 Negative Negative Final   Parainfluenza 2 Negative Negative Final   Parainfluenza 3 Negative Negative Final   Metapneumovirus Negative Negative Final   Rhinovirus Negative Negative Final   Adenovirus Negative Negative Final    Comment: (NOTE) Performed At: [facility] [address] [place], [place] [id number] Lindon Romp MD UY:[id number]     BRIEF ADMITTING H & P: Opiate tolerant patient with Hb SS who presented to the ED with pain in b/l ribs typical of sicke cell pain. Pt describes the pain as sharp and throbbing in nature and non-radiating. At it's greatest intensity. Pain was 10/10 and patient took oral Dilaudid without much relief. Thus he came to the ED. Of note, patient is supposed to be on MS Contin on a daily basis but has been unable to obtain prescription so has not had MS Contin since last week. He denies dizziness, Pre-cordial chest wall pain, pain with deep breathing, SOB, DOE, F/C, N/V/D. He also has known PAH and is chronically hypoxic with an Oxygen requirement of 3 L/min during activity and at night. However he has not been compliant wit the use of Oxygen.  Incidental findings during the course of his ED evaluation was an elevated Troponin of 3.48 without any significant EKG changes. He was also found to have an elevated BNP of 549.5 which is below his BNP Level of 1117 12 days go and chronically elevated pro-BNP as high as 5000 in the absence of N-STEMI. He was previously evaluated by Cardiology (07/23/2014) who recommended [facility] as an out-patient but patient has not yet been referred. He was also found to have an elevated D-dimer and a CT angiogram demonstrated chronic bi-lateral PE but no new PE. Please note that patient missed one dose of Lovenox yesterday but states that he has been compliant with it's use otherwise   Hospital Course:  Present on Admission:  . Acute on Chronic hypoxic respiratory failure: Pt has a baseline oxygen requirement of 4L/min of Oxygen. His oxygen requirement requirement increased to 50% mask after he was found to be hypoxic by ABG on 6L/min. It was felt to be multifactorial and acute chest was considered. Pt was transfused 3 units rbc's during this hospitalization. He was transferred to [facility] in the even that  he would require red cell pheresis. However he did not require pheresis and was transferred back to [facility]. He was treated empirically  with IV antibiotics (Vancomycin and Zosyn) for 6 days. The antibiotics were discontinued as all cultures and there was no clear evidence of infection.  . Elevated troponin I level: Pt had an elevated troponin as high as 7.7. This was felt to be secondary to right heart strain and no an acute NSTEMI. He was continued on ASA 81 mg. Lovenox 1 mg/kg daily and Metoprolol was increased to BID dosing for better HR control. He was evaluated by Cardiology and the HF team due to his right sided HF. Dr. Missy Sabins is willing to see him in follow up in the HF clinic and he was given the Phone # to schedule an appointment for follow up in 2 weeks.   Marland Kitchen Hb-SS disease with crisis: Pt was treated for crisis with IV dilaudid via PCA, Toradol and MS Contin. He was then transitioned to oral analgesics as pain resolved.  . Pulmonary HTN: Pt has known pulmonary HTN which is multifactorial including hemolysis leading decreased to nitric oxide bioavailability (NO scavenging), recurrent PE, hypoxemia, asplenia and iron overload. Pt has historically been non-compliant with use of Oxygen. The need for Oxygen use has again been reinnforced with patient and he acknowledged understanding.  . Chronic anticoagulation: Pt has a history of pulmonary embolus and is on chronic anticoagulation with Lovenox. He was non-compliant with Coumadin and failed Xarelto therapy. He is continued on Lovenox 1.5 mg/kg daily.  . Cor pulmonale, chronic: Pt had right heart failure secondary to Pulmonary HTN. He is to follow up in HF clinic with Dr. Missy Sabins.  Marland Kitchen PVC's (premature ventricular contractions): Pt noted to have occasional benign PVC's.  . Chronic pain syndrome: Continued on MS Contin.    Disposition and Follow-up: Pt discharged home in good condition and is to follow up with  [facility] and HF clinic as advised.     Discharge Instructions    Diet general    Complete by:  As directed      For home use only DME oxygen    Complete by:  As directed   Mode or (Route):  Nasal cannula  Liters per Minute:  2  Frequency:  Continuous (stationary and portable oxygen unit needed)  Oxygen conserving device:  Yes  Oxygen delivery system:  Gas     Increase activity slowly    Complete by:  As directed   Use Oxygen 0.5 L/min ATC.           DISCHARGE EXAM:  General: Alert, awake, oriented x3, in no distress presently. Normal work of breathing.  Vital Signs:BP 104/65, HR 61, T 98.4 F (36.9 C), temperature source Oral, RR 15, height 6' (1.829 m), weight 157 lb 6.5 oz (71.4 kg), SpO2 99 %. HEENT: [place]/AT PEERL, EOMI, anicteric OROPHARYNX: Moist, No exudate/ erythema/lesions.  Heart: Regular rate and rhythm, without murmurs but loud P2 noted. PMI non-displaced. Exam reveals no decreased pulses. Pulmonary/Chest: Normal effort. Breath sounds normal. No. Apnea. Clear to auscultation,no stridor,  no wheezing and no rhonchi noted. No respiratory distress and no tenderness noted. Abdomen: Soft, nontender, nondistended, normal bowel sounds, no masses no hepatosplenomegaly noted. No fluid wave and no ascites. There is no guarding or rebound. Neuro: Alert and oriented to person, place and time. Normal motor skills, Displays no atrophy or tremors and exhibits normal muscle tone.  No focal neurological deficits noted cranial nerves II through XII grossly intact. No sensory deficit noted. Strength at baseline in bilateral upper and lower extremities. Gait normal. Musculoskeletal: No  warm swelling or erythema around joints, no spinal tenderness noted. Psychiatric: Patient alert and oriented x3, good insight and cognition, good recent to remote recall. Mood, memory, affect and judgement normal Skin: Skin is warm and dry. No bruising, no ecchymosis and no rash noted. Pt is not diaphoretic. No  erythema. No pallor      Recent Labs  09/14/14 0600 09/15/14 0450  NA 138 137  K 4.3 4.0  CL 111 108  CO2 22 23  GLUCOSE 97 103*  BUN 13 12  CREATININE 0.93 0.83  CALCIUM 8.7* 8.7*    Recent Labs  09/14/14 0600 09/15/14 0450  AST 28 28  ALT 18 16*  ALKPHOS 69 67  BILITOT 2.4* 2.4*  PROT 6.8 6.9  ALBUMIN 3.4* 3.2*   No results for input(s): LIPASE, AMYLASE in the last 72 hours.  Recent Labs  09/15/14 0450 09/16/14 0600  WBC 13.2* 14.2*  NEUTROABS 9.6* 10.2*  HGB 7.2* 7.6*  HCT 21.6* 22.2*  MCV 91.5 92.1  PLT 603* 641*     Total time spent including face to face and decision making was greater than 30 minutes  Signed: Latresa Gasser A. 09/16/2014, 12:47 PM

## 2014-09-19 ENCOUNTER — Other Ambulatory Visit: Payer: Self-pay | Admitting: Family Medicine

## 2014-09-19 DIAGNOSIS — G4700 Insomnia, unspecified: Secondary | ICD-10-CM

## 2014-09-19 MED ORDER — ZOLPIDEM TARTRATE 10 MG PO TABS: 10.0000 mg | ORAL_TABLET | Freq: Every evening | ORAL | Status: DC | PRN

## 2014-09-26 ENCOUNTER — Other Ambulatory Visit: Payer: Self-pay | Admitting: Family Medicine

## 2014-09-26 IMAGING — CR DG CHEST 2V
2 series · 2 of 2 positions shown · non-contrast
Comparison: 10/02/2011 and earlier.

CLINICAL DATA: 31-year-old male rib pain and sickle cell disease.

CHEST - 2 VIEW

[w chest pa]
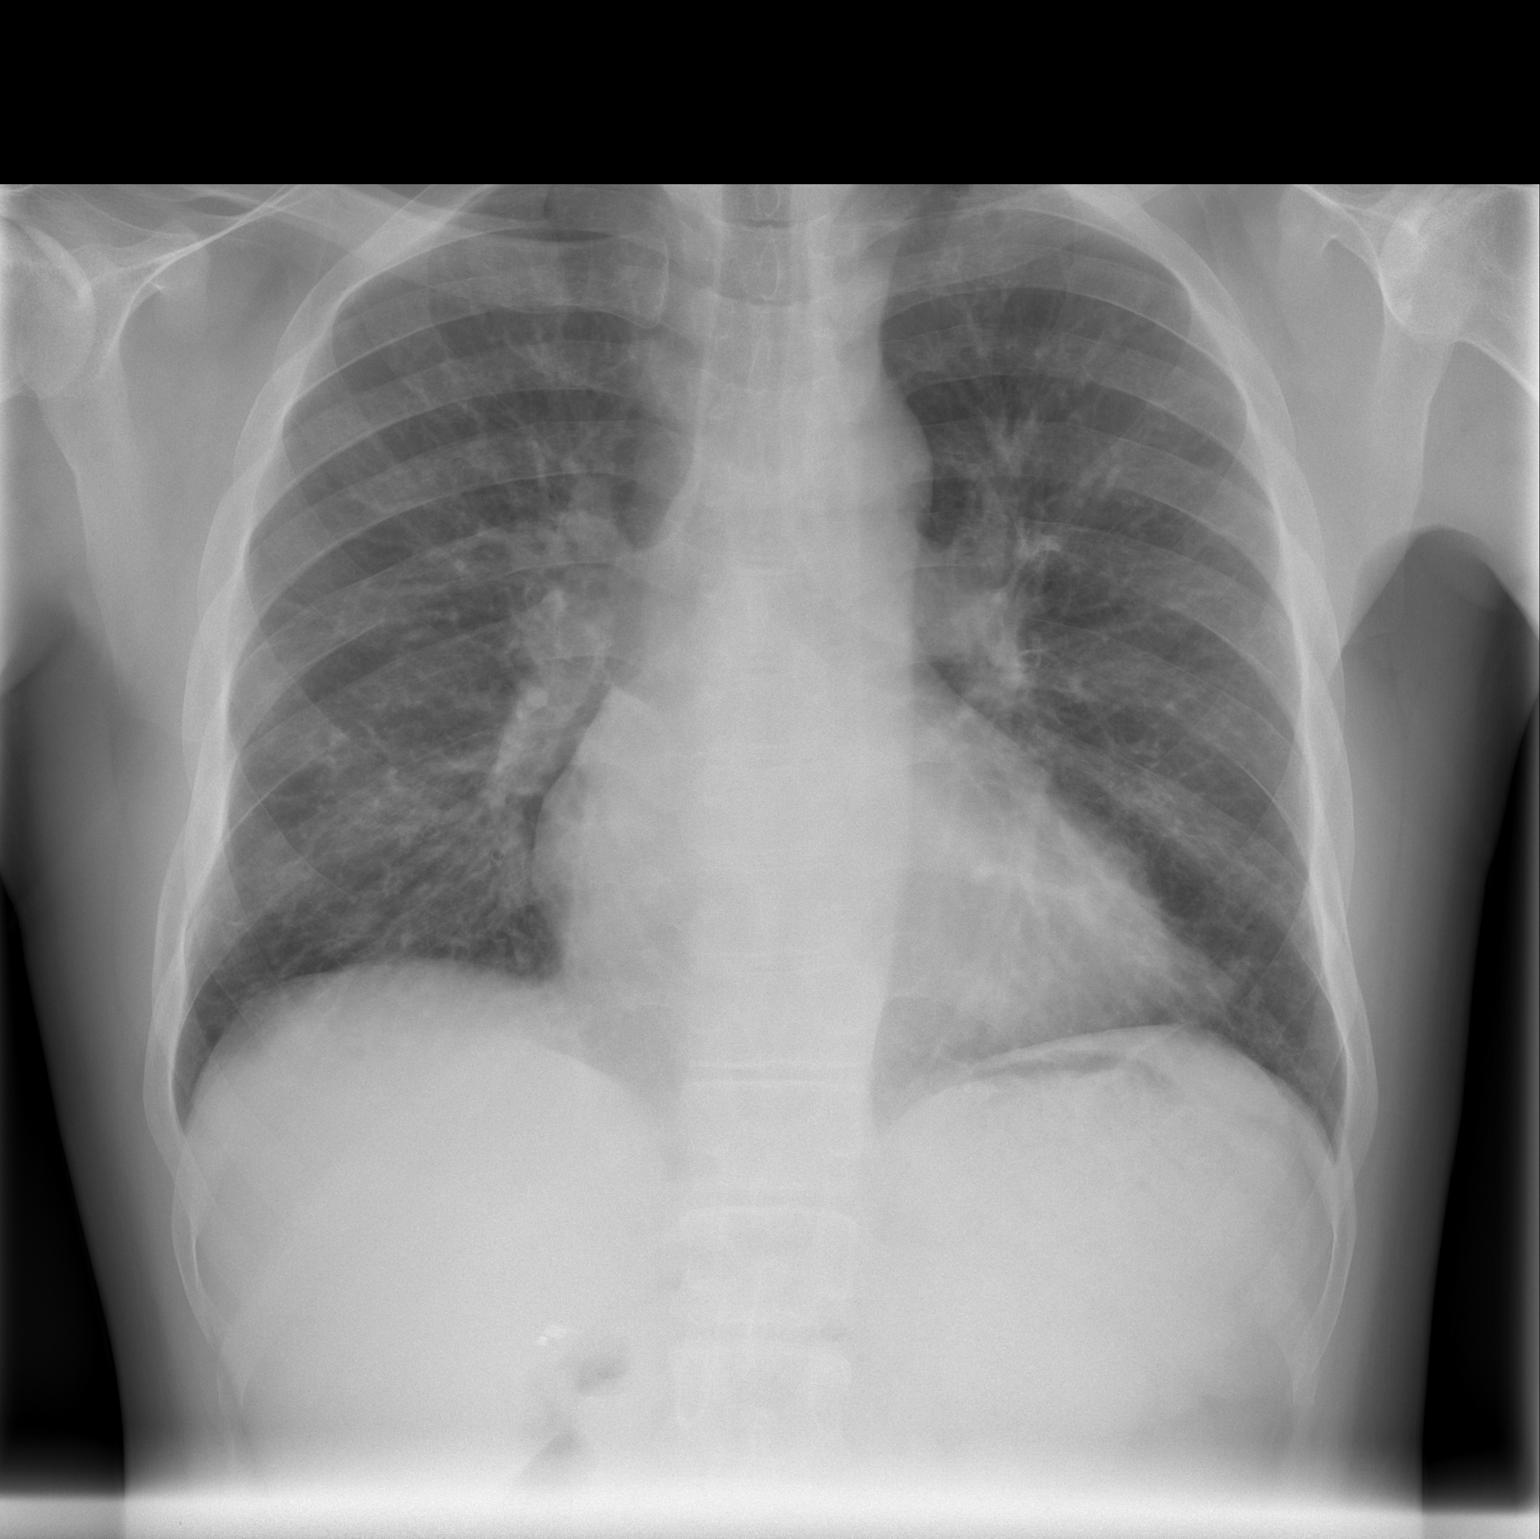

[w chest lat]
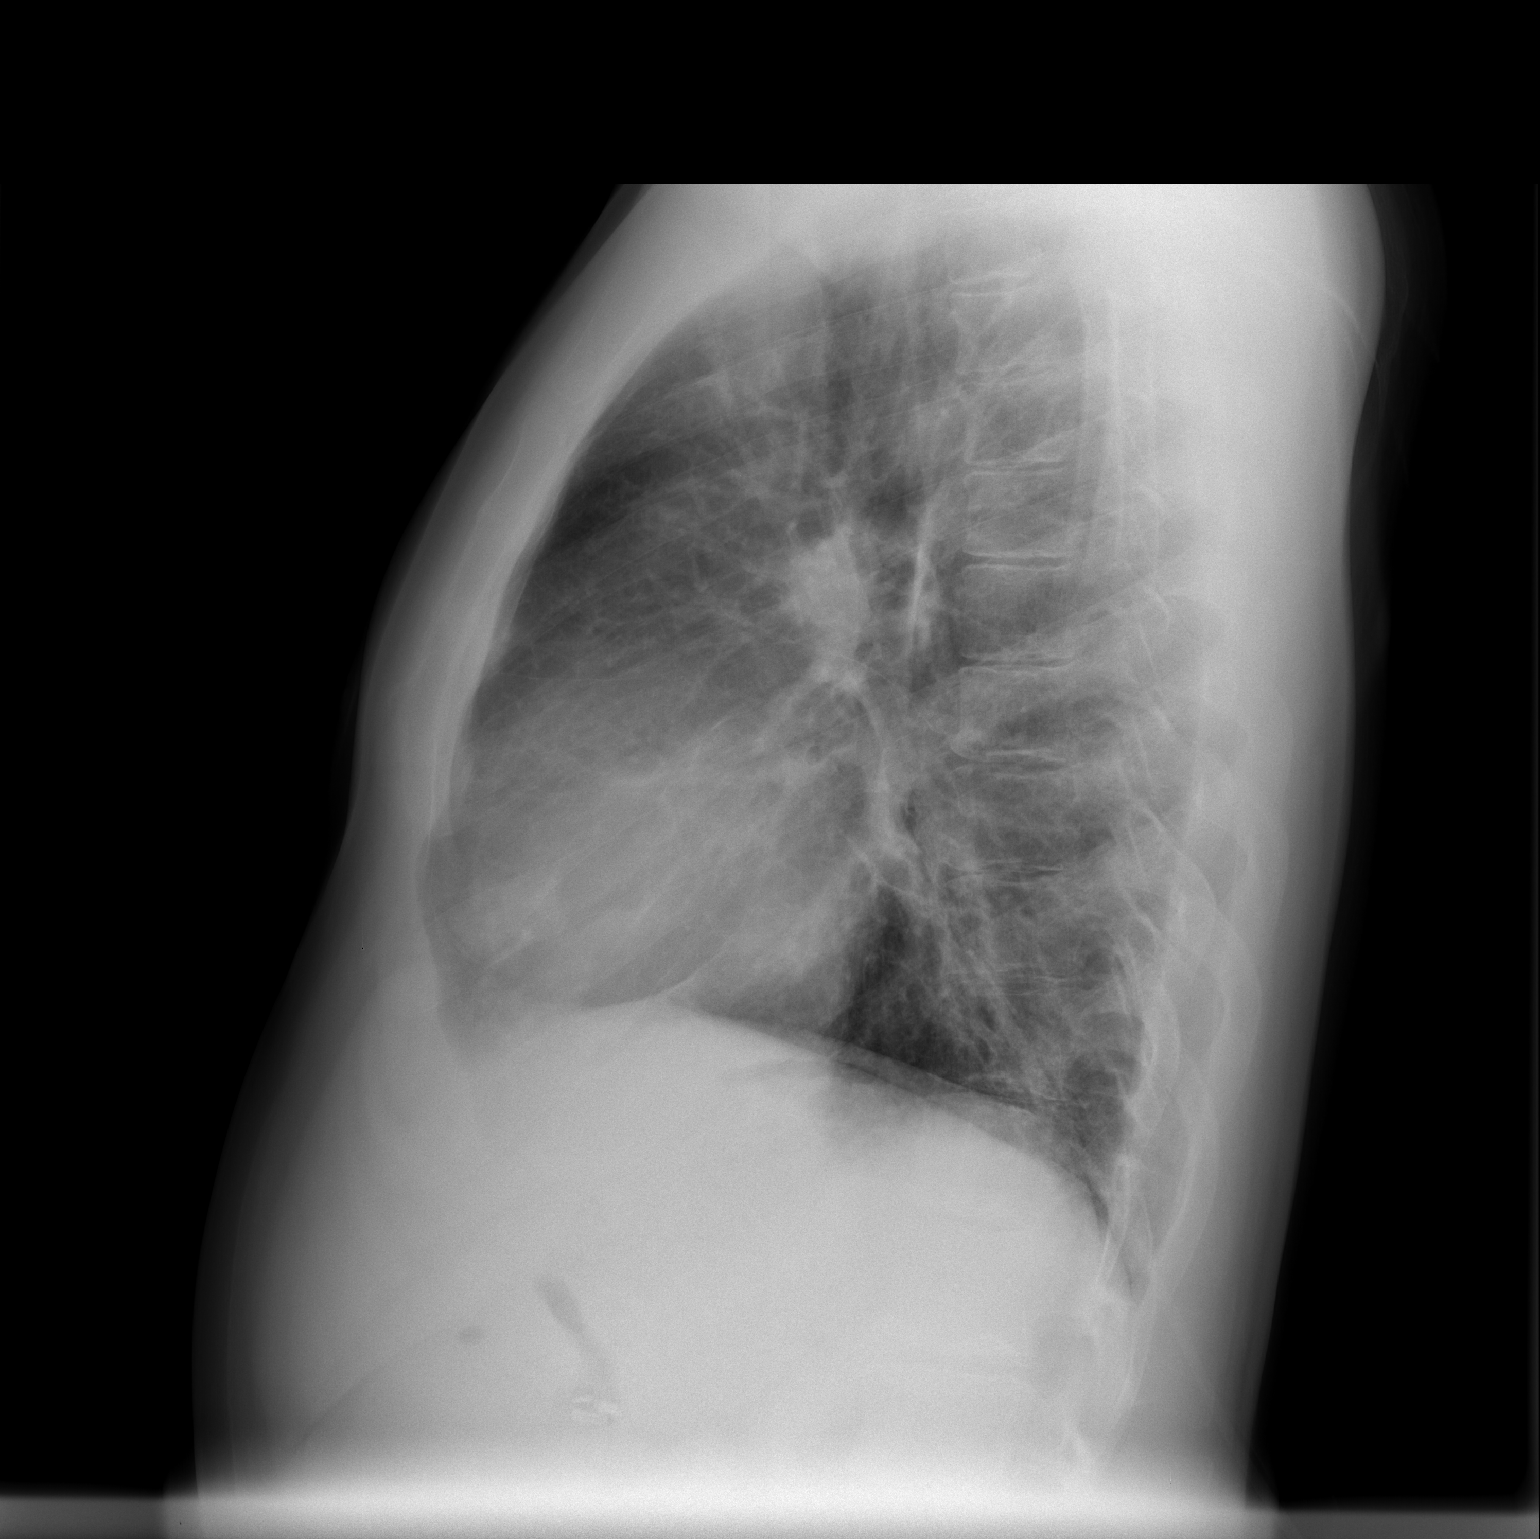

[2 of 2 positions shown; findings below may reference images not displayed]

FINDINGS: Decreased cardiac size. Other mediastinal contours are
within normal limits.  Visualized tracheal air column is within
normal limits.  Decreased pulmonary interstitial
markings/vascularity.  No pleural effusion.  No confluent or acute
pulmonary opacity.  No pneumothorax or edema. Stable visualized
osseous structures.  Right upper quadrant surgical clips.
IMPRESSION: No acute cardiopulmonary abnormality.

## 2014-09-26 MED ORDER — ENOXAPARIN SODIUM 150 MG/ML ~~LOC~~ SOLN: 110.0000 mg | SUBCUTANEOUS | Status: DC

## 2014-09-26 NOTE — Telephone Encounter (Signed)
Refill for lovenox sent into pharmacy. Thanks!

## 2014-09-29 ENCOUNTER — Encounter (HOSPITAL_COMMUNITY): Payer: Self-pay

## 2014-09-29 ENCOUNTER — Telehealth (HOSPITAL_COMMUNITY): Payer: Self-pay | Admitting: *Deleted

## 2014-09-29 ENCOUNTER — Non-Acute Institutional Stay (HOSPITAL_BASED_OUTPATIENT_CLINIC_OR_DEPARTMENT_OTHER)
Admission: AD | Admit: 2014-09-29 | Discharge: 2014-09-29 | Disposition: A | Discharge status: Home / Self Care | Payer: Medicare Other | Source: Home / Self Care | Attending: Internal Medicine | Admitting: Internal Medicine

## 2014-09-29 DIAGNOSIS — I10 Essential (primary) hypertension: Secondary | ICD-10-CM

## 2014-09-29 DIAGNOSIS — Z86711 Personal history of pulmonary embolism: Secondary | ICD-10-CM

## 2014-09-29 DIAGNOSIS — N179 Acute kidney failure, unspecified: Secondary | ICD-10-CM | POA: Diagnosis not present

## 2014-09-29 DIAGNOSIS — J9611 Chronic respiratory failure with hypoxia: Secondary | ICD-10-CM | POA: Diagnosis present

## 2014-09-29 DIAGNOSIS — Z86718 Personal history of other venous thrombosis and embolism: Secondary | ICD-10-CM

## 2014-09-29 DIAGNOSIS — I272 Other secondary pulmonary hypertension: Secondary | ICD-10-CM | POA: Diagnosis present

## 2014-09-29 DIAGNOSIS — Z833 Family history of diabetes mellitus: Secondary | ICD-10-CM

## 2014-09-29 DIAGNOSIS — Z9119 Patient's noncompliance with other medical treatment and regimen: Secondary | ICD-10-CM | POA: Diagnosis present

## 2014-09-29 DIAGNOSIS — M791 Myalgia: Secondary | ICD-10-CM | POA: Diagnosis not present

## 2014-09-29 DIAGNOSIS — Z7722 Contact with and (suspected) exposure to environmental tobacco smoke (acute) (chronic): Secondary | ICD-10-CM | POA: Insufficient documentation

## 2014-09-29 DIAGNOSIS — Z96641 Presence of right artificial hip joint: Secondary | ICD-10-CM | POA: Insufficient documentation

## 2014-09-29 DIAGNOSIS — Z9981 Dependence on supplemental oxygen: Secondary | ICD-10-CM

## 2014-09-29 DIAGNOSIS — Z87891 Personal history of nicotine dependence: Secondary | ICD-10-CM | POA: Insufficient documentation

## 2014-09-29 DIAGNOSIS — D599 Acquired hemolytic anemia, unspecified: Secondary | ICD-10-CM | POA: Diagnosis not present

## 2014-09-29 DIAGNOSIS — Z7982 Long term (current) use of aspirin: Secondary | ICD-10-CM

## 2014-09-29 DIAGNOSIS — Z79899 Other long term (current) drug therapy: Secondary | ICD-10-CM | POA: Insufficient documentation

## 2014-09-29 DIAGNOSIS — Z79891 Long term (current) use of opiate analgesic: Secondary | ICD-10-CM | POA: Diagnosis not present

## 2014-09-29 DIAGNOSIS — D72829 Elevated white blood cell count, unspecified: Secondary | ICD-10-CM | POA: Diagnosis not present

## 2014-09-29 DIAGNOSIS — D5700 Hb-SS disease with crisis, unspecified: Secondary | ICD-10-CM | POA: Diagnosis not present

## 2014-09-29 DIAGNOSIS — D638 Anemia in other chronic diseases classified elsewhere: Secondary | ICD-10-CM | POA: Diagnosis present

## 2014-09-29 DIAGNOSIS — Z7901 Long term (current) use of anticoagulants: Secondary | ICD-10-CM | POA: Diagnosis not present

## 2014-09-29 LAB — LACTATE DEHYDROGENASE: LDH: 391 U/L — ABNORMAL HIGH (ref 98–192)

## 2014-09-29 MED ORDER — DEXTROSE-NACL 5-0.45 % IV SOLN
INTRAVENOUS | Status: DC
  Administered 2014-09-29: 09:00:00 via INTRAVENOUS

## 2014-09-29 MED ORDER — SODIUM CHLORIDE 0.9 % IJ SOLN: 9.0000 mL | INTRAMUSCULAR | Status: DC | PRN

## 2014-09-29 MED ORDER — SENNOSIDES-DOCUSATE SODIUM 8.6-50 MG PO TABS: 1.0000 | ORAL_TABLET | Freq: Two times a day (BID) | ORAL | Status: DC

## 2014-09-29 MED ORDER — FOLIC ACID 1 MG PO TABS
1.0000 mg | ORAL_TABLET | Freq: Every day | ORAL | Status: DC
  Administered 2014-09-29: 1 mg via ORAL
  Filled 2014-09-29: qty 1

## 2014-09-29 MED ORDER — POLYETHYLENE GLYCOL 3350 17 G PO PACK: 17.0000 g | PACK | Freq: Every day | ORAL | Status: DC | PRN

## 2014-09-29 MED ORDER — DIPHENHYDRAMINE HCL 12.5 MG/5ML PO ELIX
12.5000 mg | ORAL_SOLUTION | Freq: Four times a day (QID) | ORAL | Status: DC | PRN
  Administered 2014-09-29: 12.5 mg via ORAL
  Filled 2014-09-29: qty 5

## 2014-09-29 MED ORDER — HEPARIN SOD (PORK) LOCK FLUSH 100 UNIT/ML IV SOLN
500.0000 [IU] | INTRAVENOUS | Status: AC | PRN
  Administered 2014-09-29: 500 [IU]
  Filled 2014-09-29: qty 5

## 2014-09-29 MED ORDER — KETOROLAC TROMETHAMINE 30 MG/ML IJ SOLN
30.0000 mg | Freq: Four times a day (QID) | INTRAMUSCULAR | Status: DC
  Administered 2014-09-29: 30 mg via INTRAVENOUS
  Filled 2014-09-29: qty 1

## 2014-09-29 MED ORDER — HYDROMORPHONE 2 MG/ML HIGH CONCENTRATION IV PCA SOLN
INTRAVENOUS | Status: DC
  Administered 2014-09-29: 14.15 mg via INTRAVENOUS
  Administered 2014-09-29: 09:00:00 via INTRAVENOUS
  Administered 2014-09-29: 3 mg via INTRAVENOUS
  Filled 2014-09-29: qty 25

## 2014-09-29 MED ORDER — ONDANSETRON HCL 4 MG/2ML IJ SOLN: 4.0000 mg | Freq: Four times a day (QID) | INTRAMUSCULAR | Status: DC | PRN

## 2014-09-29 MED ORDER — SODIUM CHLORIDE 0.9 % IV SOLN
12.5000 mg | Freq: Four times a day (QID) | INTRAVENOUS | Status: DC | PRN
  Filled 2014-09-29: qty 0.25

## 2014-09-29 MED ORDER — SODIUM CHLORIDE 0.9 % IJ SOLN
10.0000 mL | INTRAMUSCULAR | Status: AC | PRN
  Administered 2014-09-29: 10 mL

## 2014-09-29 MED ORDER — NALOXONE HCL 0.4 MG/ML IJ SOLN: 0.4000 mg | INTRAMUSCULAR | Status: DC | PRN

## 2014-09-29 NOTE — Discharge Summary (Signed)
PCP: [name], MD  Admit date: 09/29/2014 Discharge date: 09/29/2014  Time spent: 15 minutes   Discharge Diagnoses:  Active Problems:   Sickle cell crisis   Discharge Condition: Stable Diet recommendation: Regular  Filed Weights   09/29/14 0856  Weight: 163 lb (73.936 kg)    History of present illness:  [name] is a 34 y.o. male with history of Sickle cell disease presents for treatment of a painful crisis. This episode of increased pain in his rib cage started on Saturday night and has not been relieved by home medications. He reports his pain being 8/10. He reports nothing is different from usual about this episode. He took his last MS contin and Dilaudid at 6 am this morning.    Hospital Course:  Patient was admitted to the day hospital with sickle cell painful crisis. He was treated with IV Dilaudid PCA, Toradol as well as IV fluids. Heimproved symptomatically and her pain went down from 8 out of 10 down to 4 out of 10 at the time of discharge. He is to follow-up in the clinic as previously scheduled. And he is to continue with his home medications as per prior to admission.  Discharge Exam: Filed Vitals:   09/29/14 0856  BP: 100/64  Pulse: 78  Temp: 98.5 F (36.9 C)  Resp: 18    General appearance: alert, cooperative and no distress Eyes: conjunctivae/corneas clear. PERRL, EOM's intact. Fundi benign. Neck: no adenopathy, no carotid bruit, no JVD, supple, symmetrical, trachea midline and thyroid not enlarged, symmetric, no tenderness/mass/nodules Back: symmetric, no curvature. ROM normal. No CVA tenderness. Resp: clear to auscultation bilaterally Chest wall: no tenderness Cardio: regular rate and rhythm, S1, S2 normal, no murmur, click, rub or gallop GI: soft, non-tender; bowel sounds normal; no masses, no organomegaly Extremities: extremities normal, atraumatic, no cyanosis or edema Pulses: 2+ and symmetric Skin: Skin color, texture,  turgor normal. No rashes or lesions Neurologic: Grossly normal  Discharge Instructions  Home to self-care Continue current medications Follow-up in clinic in 2-3 days.    Current Discharge Medication List    CONTINUE these medications which have NOT CHANGED   Details  aspirin 81 MG chewable tablet Chew 1 tablet (81 mg total) by mouth daily. Qty: 30 tablet, Refills: 11    Cholecalciferol (VITAMIN D) 2000 UNITS tablet Take 1 tablet (2,000 Units total) by mouth daily. Qty: 90 tablet, Refills: 3   Associated Diagnoses: Vitamin D deficiency    diltiazem (CARDIZEM CD) 120 MG 24 hr capsule Take 1 capsule (120 mg total) by mouth daily. Qty: 30 capsule, Refills: 1    enoxaparin (LOVENOX) 150 MG/ML injection Inject 0.73 mLs (110 mg total) into the skin daily. Qty: 22 Syringe, Refills: 5    folic acid (FOLVITE) 1 MG tablet Take 1 tablet (1 mg total) by mouth every morning. 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 Qty: 90 tablet, Refills: 0   Associated Diagnoses: Hb-SS disease with crisis    hydroxyurea (HYDREA) 500 MG capsule Take 3 capsules (1,500 mg total) by mouth daily. May take with food to minimize GI side effects. Qty: 90 capsule, Refills: 3   Associated Diagnoses: Hb-SS disease with crisis    lisinopril (PRINIVIL,ZESTRIL) 10 MG tablet Take 1 tablet (10 mg total) by mouth daily. Qty: 30 tablet,  Refills: 1   Associated Diagnoses: Hb-SS disease without crisis; Essential hypertension    metoprolol tartrate (LOPRESSOR) 25 MG tablet Take 1 tablet (25 mg total) by mouth daily. Qty: 30 tablet, Refills: 6   Associated Diagnoses: Essential hypertension    morphine (MS CONTIN) 30 MG 12 hr tablet Take 1 tablet (30 mg total) by mouth every 12 (twelve) hours. Qty: 60 tablet, Refills: 0   Associated Diagnoses: Chronic pain  syndrome    potassium chloride SA (K-DUR,KLOR-CON) 20 MEQ tablet Take 1 tablet (20 mEq total) by mouth every morning. Qty: 30 tablet, Refills: 3    mupirocin ointment (BACTROBAN) 2 % Apply 1 application topically 2 (two) times daily. Apply ointment to left great toe twice a day and apply dry dressing Qty: 22 g, Refills: 6    zolpidem (AMBIEN) 10 MG tablet Take 1 tablet (10 mg total) by mouth at bedtime as needed for sleep. Qty: 30 tablet, Refills: 0   Associated Diagnoses: Insomnia       No Known Allergies    The results of significant diagnostics from this hospitalization (including imaging, microbiology, ancillary and laboratory) are listed below for reference.    Significant Diagnostic Studies: Dg Chest 1 View  09/10/2014   CLINICAL DATA:  Respiratory failure. Hypoxemia. Sickle cell patient.  EXAM: CHEST  1 VIEW  COMPARISON:  Radiographs and CT earlier today.  FINDINGS: 1813 hour. Stable cardiomegaly. The left subclavian Port-A-Cath is unchanged at the SVC right atrial junction. Stable chronic vascular congestion and basilar scarring. No superimposed edema, confluent airspace opacity or significant pleural effusion. 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  IMPRESSION: Stable examination compared with earlier study today. Chronic cardiomegaly and chronic vascular congestion.   Electronically Signed   By: Richardean Sale M.D.   On: 09/10/2014 18:27   Dg Chest 2 View  09/10/2014   CLINICAL DATA:  Acute onset of sickle cell pain crisis. Decreased O2 saturation. Worsening generalized weakness. Initial encounter.  EXAM: CHEST  2 VIEW  COMPARISON:  Chest radiograph from 08/29/2014  FINDINGS: The lungs are well-aerated. Mild vascular congestion is noted. Minimal bilateral atelectasis or scarring is noted. There is no evidence of pleural effusion or pneumothorax.  The heart is enlarged. A left-sided chest port is noted ending about the cavoatrial junction. No acute osseous  abnormalities are seen. Clips are noted within the right upper quadrant, reflecting prior cholecystectomy.  IMPRESSION: Mild vascular congestion and cardiomegaly noted. Minimal bilateral atelectasis or scarring noted.   Electronically Signed   By: Garald Balding M.D.   On: 09/10/2014 06:00   Ct Angio Chest Pe W/cm &/or Wo Cm  09/10/2014   CLINICAL DATA:  Short of breath.  EXAM: CT ANGIOGRAPHY CHEST WITH CONTRAST  TECHNIQUE: Multidetector CT imaging of the chest was performed using the standard protocol during bolus administration of intravenous contrast. Multiplanar CT image reconstructions and MIPs were obtained to evaluate the vascular anatomy.  CONTRAST:  138mL OMNIPAQUE IOHEXOL 350 MG/ML SOLN  COMPARISON:  08/29/2014  FINDINGS: Mediastinum: There is moderate cardiac enlargement. No pericardial effusion. The trachea is patent and appears midline. Normal appearance of the esophagus. Prominent soft tissue is identified within the anterior mediastinum. Stress set prominent anterior mediastinal adenopathy is again identified. Index pre-vascular lymph node measures 1 cm, image 35/series 4. Previously 1.2 cm. Bilateral lower lobe pulmonary artery filling defects are again noted and are in the same distribution as the previous exam. No new pulmonary emboli identified.  Lungs/Pleura: There is no  pleural effusion. Mosaic attenuation pattern is identified throughout both lungs compatible with pulmonary emboli. No airspace consolidation. Scarring is noted predominantly involving the lower lobes.  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 Diffuse increased attenuation throughout the liver is noted. The visualized adrenal glands are unremarkable.  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  Review of the MIP images confirms the above findings.  IMPRESSION: 1. Again noted are chronic  bilateral lower lobe pulmonary emboli. No new areas of pulmonary emboli identified. 2. Cardiac enlargement 3. Pulmonary and osseous stigmata of sickle cell disease.   Electronically Signed   By: Kerby Moors M.D.   On: 09/10/2014 09:00   Nm Pulmonary Perf And Vent  09/15/2014   CLINICAL DATA:  Hypoxia. Pulmonary embolus on demonstrated on CT of 09/10/2014.  EXAM: NUCLEAR MEDICINE VENTILATION - PERFUSION LUNG SCAN  TECHNIQUE: Ventilation images were obtained in multiple projections using inhaled aerosol Tc-64m DTPA. Perfusion images were obtained in multiple projections after intravenous injection of Tc-43m MAA.  RADIOPHARMACEUTICALS:  Thirty-eight mCi Technetium-33m DTPA aerosol inhalation and 5 mCi Technetium-29m MAA IV  COMPARISON:  CT 09/18/2014  FINDINGS: Ventilation: There is moderate decreased ventilation to the LEFT lower lobe posteriorly.  Perfusion: There is decreased regional perfusion to the LEFT lower lobe posteriorly which matches the ventilation defect but is slightly more pronounced and corresponds with chronic pulmonary emboli seen on comparison CT. There additional small peripheral perfusion defects in the LEFT and RIGHT lungs.  IMPRESSION: 1. No clear evidence of acute pulmonary embolism. 2. Decreased perfusion and ventilation to the LEFT lower lobe with the profusion decreased to a greater degree than the ventilation corresponds to the chronic pulmonary emboli seen on comparison CT. 3. Multiple small peripheral perfusion defects also consistent chronic PE.   Electronically Signed   By: Suzy Bouchard M.D.   On: 09/15/2014 16:24   Dg Chest Port 1 View  09/12/2014   CLINICAL DATA:  Respiratory failure.  EXAM: PORTABLE CHEST - 1 VIEW  COMPARISON:  Chest radiograph from 1 day prior.  FINDINGS: Left subclavian central venous catheter terminates near the cavoatrial junction. Stable cardiomediastinal silhouette with mild to moderate cardiomegaly. No pneumothorax. No pleural effusion. There is  stable mild to moderate pulmonary edema. No new focal lung opacity. Stable mild scarring versus atelectasis in the mid to lower left lung.  IMPRESSION: 1. Stable mild to moderate congestive heart failure. 2. Stable mild scarring versus atelectasis in the basilar left lung.   Electronically Signed   By: Ilona Sorrel M.D.   On: 09/12/2014 09:32   Dg Chest Port 1 View  09/11/2014   CLINICAL DATA:  Respiratory failure.  EXAM: PORTABLE CHEST - 1 VIEW  COMPARISON:  09/10/2014.  FINDINGS: PowerPort catheter noted in good anatomic position. Mediastinum and hilar structures normal. Cardiomegaly with mild pulmonary vascular prominence and interstitial prominence. Mild component of congestive heart failure cannot be excluded. No pleural effusion or pneumothorax.  IMPRESSION: 1. Power port catheter noted with tip in stable position.  2. Severe cardiomegaly again noted. Mild pulmonary vascular prominence interstitial prominence. Mild component of congestive heart failure cannot be excluded.   Electronically Signed   By: Marcello Moores  Register   On: 09/11/2014 07:14    Microbiology: No results found for this or any previous visit (from the past 240 hour(s)).   Labs: Basic Metabolic Panel: No results for input(s): NA, K, CL, CO2, GLUCOSE, BUN, CREATININE, CALCIUM,  MG, PHOS in the last 168 hours. Liver Function Tests: No results for input(s): AST, ALT, ALKPHOS, BILITOT, PROT, ALBUMIN in the last 168 hours. No results for input(s): LIPASE, AMYLASE in the last 168 hours. No results for input(s): AMMONIA in the last 168 hours. CBC: No results for input(s): WBC, NEUTROABS, HGB, HCT, MCV, PLT in the last 168 hours. Cardiac Enzymes: No results for input(s): CKTOTAL, CKMB, CKMBINDEX, TROPONINI in the last 168 hours. BNP: BNP (last 3 results)  Recent Labs  08/29/14 0810 09/10/14 0612  BNP 1117.0* 549.5*    ProBNP (last 3 results)  Recent Labs  01/01/14 0909  PROBNP 5790.0*    CBG: No results for input(s):  GLUCAP in the last 168 hours.     Signed:  Sharon Seller MSN,FNP,BC

## 2014-09-29 NOTE — H&P (Signed)
[facility] History and Physical  MARVEL MCPHILLIPS RSW:[id number] DOB: 1980/12/04 DOA: 09/29/2014  PCP: [name], MD   Chief Complaint:  Chief Complaint  Patient presents with  . Sickle Cell Pain Crisis    ribs 8/10     HPI: [name] is a 34 y.o. male with history of Sickle cell disease presents for treatment of a painful crisis. This episode of increased pain in his rib cage started on Saturday night and has not been relieved by home medications. He reports nothing is different from usual about this episode. He took his last MS contin and Dilaudid at 6 am this morning.   Systemic Review: General: The patient denies anorexia, fever, weight loss Cardiac: Denies chest pain, syncope, palpitations, pedal edema  Respiratory: Denies cough, shortness of breath, wheezing GI: Denies severe indigestion/heartburn, abdominal pain, nausea, vomiting, diarrhea and constipation GU: Denies hematuria, incontinence, dysuria  Musculoskeletal: Denies arthritis  Skin: Denies suspicious skin lesions Neurologic: Denies focal weakness or numbness, change in vision  Past Medical History  Diagnosis Date  . Sickle cell anemia   . Blood transfusion   . Acute embolism and thrombosis of right internal jugular vein   . Hypokalemia   . Mood disorder   . History of pulmonary embolus (PE)   . Avascular necrosis   . Leukocytosis     Chronic  . Thrombocytosis     Chronic  . Hypertension   . History of Clostridium difficile infection   . Uses marijuana   . Chronic anticoagulation   . Functional asplenia   . Former smoker   . Second hand tobacco smoke exposure   . Alcohol consumption of one to four drinks per day   . Noncompliance with medication regimen   . Sickle-cell crisis with associated acute chest syndrome 05/13/2013  . Acute chest syndrome 06/18/2013  . Demand ischemia 01/02/2014    Past Surgical History  Procedure Laterality Date  . Right hip replacement     08/2006  . Cholecystectomy      01/2008  . Porta cath placement    . Porta cath removal    . Umbilical hernia repair      01/2008  . Excision of left periauricular cyst      10/2009  . Excision of right ear lobe cyst with primary closur      11/2007  . Portacath placement  01/05/2012    Procedure: INSERTION PORT-A-CATH;  Surgeon: Odis Hollingshead, MD;  Location: [place];  Service: General;  Laterality: N/A;  ultrasound guiced port a cath insertion with fluoroscopy    No Known Allergies  Family History  Problem Relation Age of Onset  . Sickle cell trait Mother   . Depression Mother   . Diabetes Mother   . Sickle cell trait Father   . Sickle cell trait Brother       Prior to Admission medications   Medication Sig Start Date End Date Taking? Authorizing Provider  aspirin 81 MG chewable tablet Chew 1 tablet (81 mg total) by mouth daily. 07/23/14  Yes [name], MD  Cholecalciferol (VITAMIN D) 2000 UNITS tablet Take 1 tablet (2,000 Units total) by mouth daily. 02/13/14  Yes [name], MD  diltiazem (CARDIZEM CD) 120 MG 24 hr capsule Take 1 capsule (120 mg total) by mouth daily. 03/13/14  Yes Costin Karlyne Greenspan, MD  enoxaparin (LOVENOX) 150 MG/ML injection Inject 0.73 mLs (110 mg total) into the skin daily. 09/26/14  Yes  Micheline Chapman, NP  folic acid (FOLVITE) 1 MG tablet Take 1 tablet (1 mg total) by mouth every morning. 10/08/13  Yes [name], MD  HYDROmorphone (DILAUDID) 4 MG tablet Take 1 tablet (4 mg total) by mouth every 4 (four) hours as needed for severe pain. 09/09/14  Yes Tresa Garter, MD  hydroxyurea (HYDREA) 500 MG capsule Take 3 capsules (1,500 mg total) by mouth daily. May take with food to minimize GI side effects. 09/16/14  Yes Tresa Garter, MD  lisinopril (PRINIVIL,ZESTRIL) 10 MG tablet Take 1 tablet (10 mg total) by mouth daily. 01/22/14  Yes [name], MD  metoprolol tartrate (LOPRESSOR) 25 MG tablet Take 1 tablet (25 mg  total) by mouth daily. 08/22/13  Yes [name], MD  morphine (MS CONTIN) 30 MG 12 hr tablet Take 1 tablet (30 mg total) by mouth every 12 (twelve) hours. 09/09/14  Yes Tresa Garter, MD  potassium chloride SA (K-DUR,KLOR-CON) 20 MEQ tablet Take 1 tablet (20 mEq total) by mouth every morning. 06/09/14  Yes [name], MD  mupirocin ointment (BACTROBAN) 2 % Apply 1 application topically 2 (two) times daily. Apply ointment to left great toe twice a day and apply dry dressing 09/01/14   Newt Minion, MD  zolpidem (AMBIEN) 10 MG tablet Take 1 tablet (10 mg total) by mouth at bedtime as needed for sleep. 09/19/14   Micheline Chapman, NP     Physical Exam: Filed Vitals:   09/29/14 0856  BP: 100/64  Pulse: 78  Temp: 98.5 F (36.9 C)  TempSrc: Oral  Resp: 18  Height: 6' (1.829 m)  Weight: 163 lb (73.936 kg)  SpO2: 100%    General: Alert, awake, afebrile, anicteric, not in obvious distress HEENT: Normocephalic and Atraumatic, Mucous membranes pink                PERRLA; EOM intact; No scleral icterus,                 Nares: Patent, Oropharynx: Clear, Fair Dentition                 Neck: FROM, no cervical lymphadenopathy, thyromegaly, carotid bruit or JVD;  CHEST WALL: No tenderness  CHEST: Normal respiration, clear to auscultation bilaterally  HEART: Regular rate and rhythm; no murmurs rubs or gallops  BACK: No kyphosis or scoliosis; no CVA tenderness  ABDOMEN: Positive Bowel Sounds, soft, non-tender; no masses, no organomegaly Rectal Exam: deferred EXTREMITIES: No cyanosis, clubbing, or edema SKIN:  no rash or ulceration  CNS: Alert and Oriented x 4, Nonfocal exam,   Labs on Admission:  Basic Metabolic Panel: No results for input(s): NA, K, CL, CO2, GLUCOSE, BUN, CREATININE, CALCIUM, MG, PHOS in the last 168 hours. Liver Function Tests: No results for input(s): AST, ALT, ALKPHOS, BILITOT, PROT, ALBUMIN in the last 168 hours. No results for input(s): LIPASE, AMYLASE  in the last 168 hours. No results for input(s): AMMONIA in the last 168 hours. CBC: No results for input(s): WBC, NEUTROABS, HGB, HCT, MCV, PLT in the last 168 hours. Cardiac Enzymes: No results for input(s): CKTOTAL, CKMB, CKMBINDEX, TROPONINI in the last 168 hours.  BNP (last 3 results)  Recent Labs  08/29/14 0810 09/10/14 0612  BNP 1117.0* 549.5*    ProBNP (last 3 results)  Recent Labs  01/01/14 0909  PROBNP 5790.0*    CBG: No results for input(s): GLUCAP in the last 168 hours.   Assessment/Plan Active Problems:  Sickle cell crisis   Admits to the Day Hospital  IVF D5 .45% Saline @ 125 mls/hour  Weight based Dilaudid PCA started within 30 minutes of admission  IV Toradol   Monitor vitals very closely, Re-evaluate pain scale every hour  Oxygen by [place]  Patient will be re-evaluated for pain in the context of function and relationship to baseline as care progresses.  If no significant relieve from pain (remains above 5/10) will transfer patient to inpatient services for further evaluation and management  Code Status: Full  Family Communication: None  DVT Prophylaxis: Ambulate as tolerated   Time spent: 35 Minutes  Marquise Lambson, [place], MSN, FNP, BC.  If 7PM-7AM, please contact night-coverage [email or web address] 09/29/2014, 9:03 AM

## 2014-09-29 NOTE — Progress Notes (Signed)
Patient seen in day hospital today for treatment. Rates pain at goal 4/10 at discharge. Denies any further needs at this time. Patient alert, oriented and ambulatory at discharge. Instructions reviewed and follow up appointment given and highlighted on instruction sheet.

## 2014-10-02 ENCOUNTER — Inpatient Hospital Stay (HOSPITAL_COMMUNITY)
Admission: EM | Admit: 2014-10-02 | Discharge: 2014-10-04 | DRG: 812 | Disposition: A | Discharge status: Home / Self Care | Payer: Medicare Other | Attending: Internal Medicine | Admitting: Internal Medicine

## 2014-10-02 ENCOUNTER — Encounter (HOSPITAL_COMMUNITY): Payer: Self-pay | Admitting: *Deleted

## 2014-10-02 DIAGNOSIS — Z833 Family history of diabetes mellitus: Secondary | ICD-10-CM | POA: Diagnosis not present

## 2014-10-02 DIAGNOSIS — Z79891 Long term (current) use of opiate analgesic: Secondary | ICD-10-CM | POA: Diagnosis not present

## 2014-10-02 DIAGNOSIS — D5700 Hb-SS disease with crisis, unspecified: Secondary | ICD-10-CM | POA: Diagnosis not present

## 2014-10-02 DIAGNOSIS — Z9119 Patient's noncompliance with other medical treatment and regimen: Secondary | ICD-10-CM | POA: Diagnosis present

## 2014-10-02 DIAGNOSIS — D638 Anemia in other chronic diseases classified elsewhere: Secondary | ICD-10-CM

## 2014-10-02 DIAGNOSIS — M791 Myalgia: Secondary | ICD-10-CM | POA: Diagnosis not present

## 2014-10-02 DIAGNOSIS — Z7901 Long term (current) use of anticoagulants: Secondary | ICD-10-CM

## 2014-10-02 DIAGNOSIS — I272 Other secondary pulmonary hypertension: Secondary | ICD-10-CM

## 2014-10-02 DIAGNOSIS — N179 Acute kidney failure, unspecified: Secondary | ICD-10-CM | POA: Diagnosis not present

## 2014-10-02 DIAGNOSIS — Z96641 Presence of right artificial hip joint: Secondary | ICD-10-CM | POA: Diagnosis present

## 2014-10-02 DIAGNOSIS — D72829 Elevated white blood cell count, unspecified: Secondary | ICD-10-CM

## 2014-10-02 DIAGNOSIS — Z9981 Dependence on supplemental oxygen: Secondary | ICD-10-CM | POA: Diagnosis not present

## 2014-10-02 DIAGNOSIS — I2721 Secondary pulmonary arterial hypertension: Secondary | ICD-10-CM | POA: Diagnosis present

## 2014-10-02 DIAGNOSIS — D599 Acquired hemolytic anemia, unspecified: Secondary | ICD-10-CM | POA: Diagnosis not present

## 2014-10-02 DIAGNOSIS — Z87891 Personal history of nicotine dependence: Secondary | ICD-10-CM | POA: Diagnosis not present

## 2014-10-02 DIAGNOSIS — I10 Essential (primary) hypertension: Secondary | ICD-10-CM | POA: Diagnosis present

## 2014-10-02 DIAGNOSIS — J9611 Chronic respiratory failure with hypoxia: Secondary | ICD-10-CM | POA: Diagnosis present

## 2014-10-02 DIAGNOSIS — Z86718 Personal history of other venous thrombosis and embolism: Secondary | ICD-10-CM | POA: Diagnosis not present

## 2014-10-02 DIAGNOSIS — Z86711 Personal history of pulmonary embolism: Secondary | ICD-10-CM | POA: Diagnosis not present

## 2014-10-02 LAB — CBC WITH DIFFERENTIAL/PLATELET
BASOS ABS: 0.1 10*3/uL (ref 0.0–0.1)
BASOS PCT: 1 % (ref 0–1)
EOS PCT: 4 % (ref 0–5)
Eosinophils Absolute: 0.6 10*3/uL (ref 0.0–0.7)
HEMATOCRIT: 17.6 % — AB (ref 39.0–52.0)
Hemoglobin: 5.8 g/dL — CL (ref 13.0–17.0)
LYMPHS PCT: 16 % (ref 12–46)
Lymphs Abs: 2.5 10*3/uL (ref 0.7–4.0)
MCH: 31.9 pg (ref 26.0–34.0)
MCHC: 33 g/dL (ref 30.0–36.0)
MCV: 96.7 fL (ref 78.0–100.0)
MONO ABS: 1.2 10*3/uL — AB (ref 0.1–1.0)
Monocytes Relative: 8 % (ref 3–12)
NEUTROS ABS: 11.4 10*3/uL — AB (ref 1.7–7.7)
Neutrophils Relative %: 72 % (ref 43–77)
PLATELETS: 459 10*3/uL — AB (ref 150–400)
RBC: 1.82 MIL/uL — AB (ref 4.22–5.81)
RDW: 20.5 % — AB (ref 11.5–15.5)
WBC: 15.9 10*3/uL — AB (ref 4.0–10.5)

## 2014-10-02 LAB — COMPREHENSIVE METABOLIC PANEL
ALBUMIN: 3.7 g/dL (ref 3.5–5.0)
ALT: 26 U/L (ref 17–63)
AST: 33 U/L (ref 15–41)
Alkaline Phosphatase: 69 U/L (ref 38–126)
Anion gap: 5 (ref 5–15)
BUN: 16 mg/dL (ref 6–20)
CHLORIDE: 108 mmol/L (ref 101–111)
CO2: 23 mmol/L (ref 22–32)
CREATININE: 1.29 mg/dL — AB (ref 0.61–1.24)
Calcium: 8.7 mg/dL — ABNORMAL LOW (ref 8.9–10.3)
GFR calc Af Amer: >60 mL/min (ref >60)
GFR calc non Af Amer: >60 mL/min (ref >60)
GLUCOSE: 118 mg/dL — AB (ref 65–99)
POTASSIUM: 3.5 mmol/L (ref 3.5–5.1)
Sodium: 136 mmol/L (ref 135–145)
Total Bilirubin: 2.1 mg/dL — ABNORMAL HIGH (ref 0.3–1.2)
Total Protein: 7.3 g/dL (ref 6.5–8.1)

## 2014-10-02 LAB — RETICULOCYTES
RBC.: 1.82 MIL/uL — ABNORMAL LOW (ref 4.22–5.81)
Retic Count, Absolute: 160.2 10*3/uL (ref 19.0–186.0)
Retic Ct Pct: 8.8 % — ABNORMAL HIGH (ref 0.4–3.1)

## 2014-10-02 LAB — MAGNESIUM: MAGNESIUM: 1.6 mg/dL — AB (ref 1.7–2.4)

## 2014-10-02 LAB — PREPARE RBC (CROSSMATCH)

## 2014-10-02 MED ORDER — ZOLPIDEM TARTRATE 10 MG PO TABS
10.0000 mg | ORAL_TABLET | Freq: Every evening | ORAL | Status: DC | PRN
  Administered 2014-10-02 – 2014-10-03 (×2): 10 mg via ORAL
  Filled 2014-10-02 (×2): qty 1

## 2014-10-02 MED ORDER — ONDANSETRON HCL 4 MG/2ML IJ SOLN: 4.0000 mg | Freq: Four times a day (QID) | INTRAMUSCULAR | Status: DC | PRN

## 2014-10-02 MED ORDER — HYDROMORPHONE 2 MG/ML HIGH CONCENTRATION IV PCA SOLN
INTRAVENOUS | Status: DC
  Administered 2014-10-02: 6 mg via INTRAVENOUS
  Administered 2014-10-02: 14:00:00 via INTRAVENOUS
  Administered 2014-10-02 – 2014-10-03 (×2): 3.6 mg via INTRAVENOUS
  Administered 2014-10-03: 6 mg via INTRAVENOUS
  Administered 2014-10-03: 10.8 mg via INTRAVENOUS
  Administered 2014-10-03: 3 mg via INTRAVENOUS
  Administered 2014-10-03: 2.4 mg via INTRAVENOUS
  Administered 2014-10-03: 4.2 mg via INTRAVENOUS
  Administered 2014-10-04 (×2): 3.6 mg via INTRAVENOUS
  Administered 2014-10-04: 08:00:00 via INTRAVENOUS
  Administered 2014-10-04: 3 mg via INTRAVENOUS
  Filled 2014-10-02 (×2): qty 25

## 2014-10-02 MED ORDER — HYDROMORPHONE HCL 1 MG/ML IJ SOLN
1.0000 mg | Freq: Once | INTRAMUSCULAR | Status: AC
  Administered 2014-10-02: 1 mg via INTRAVENOUS
  Filled 2014-10-02: qty 1

## 2014-10-02 MED ORDER — HYDROMORPHONE HCL 1 MG/ML IJ SOLN
1.0000 mg | INTRAMUSCULAR | Status: AC | PRN
  Administered 2014-10-02 (×2): 1 mg via INTRAVENOUS
  Filled 2014-10-02 (×2): qty 1

## 2014-10-02 MED ORDER — DIPHENHYDRAMINE HCL 50 MG/ML IJ SOLN
25.0000 mg | Freq: Once | INTRAMUSCULAR | Status: AC
  Administered 2014-10-02: 25 mg via INTRAVENOUS
  Filled 2014-10-02: qty 1

## 2014-10-02 MED ORDER — FOLIC ACID 1 MG PO TABS
1.0000 mg | ORAL_TABLET | Freq: Every morning | ORAL | Status: DC
  Administered 2014-10-02 – 2014-10-04 (×3): 1 mg via ORAL
  Filled 2014-10-02 (×3): qty 1

## 2014-10-02 MED ORDER — HYDROMORPHONE HCL 2 MG/ML IJ SOLN
1.5000 mg | Freq: Once | INTRAMUSCULAR | Status: AC
  Administered 2014-10-02: 1.5 mg via INTRAVENOUS
  Filled 2014-10-02: qty 1

## 2014-10-02 MED ORDER — DILTIAZEM HCL ER COATED BEADS 120 MG PO CP24
120.0000 mg | ORAL_CAPSULE | Freq: Every day | ORAL | Status: DC
  Administered 2014-10-02 – 2014-10-04 (×3): 120 mg via ORAL
  Filled 2014-10-02 (×3): qty 1

## 2014-10-02 MED ORDER — MORPHINE SULFATE ER 30 MG PO TBCR
30.0000 mg | EXTENDED_RELEASE_TABLET | Freq: Two times a day (BID) | ORAL | Status: DC
  Administered 2014-10-02 – 2014-10-04 (×5): 30 mg via ORAL
  Filled 2014-10-02 (×5): qty 1

## 2014-10-02 MED ORDER — ASPIRIN 81 MG PO CHEW
81.0000 mg | CHEWABLE_TABLET | Freq: Every day | ORAL | Status: DC
  Administered 2014-10-02 – 2014-10-04 (×3): 81 mg via ORAL
  Filled 2014-10-02 (×3): qty 1

## 2014-10-02 MED ORDER — SENNOSIDES-DOCUSATE SODIUM 8.6-50 MG PO TABS
1.0000 | ORAL_TABLET | Freq: Two times a day (BID) | ORAL | Status: DC
  Administered 2014-10-02 – 2014-10-04 (×4): 1 via ORAL
  Filled 2014-10-02 (×5): qty 1

## 2014-10-02 MED ORDER — DEXTROSE-NACL 5-0.45 % IV SOLN
INTRAVENOUS | Status: DC
  Administered 2014-10-02 – 2014-10-04 (×4): via INTRAVENOUS

## 2014-10-02 MED ORDER — ENOXAPARIN SODIUM 150 MG/ML ~~LOC~~ SOLN
110.0000 mg | Freq: Every day | SUBCUTANEOUS | Status: DC
  Administered 2014-10-02 – 2014-10-03 (×2): 110 mg via SUBCUTANEOUS
  Filled 2014-10-02 (×3): qty 1

## 2014-10-02 MED ORDER — KETOROLAC TROMETHAMINE 30 MG/ML IJ SOLN
30.0000 mg | Freq: Once | INTRAMUSCULAR | Status: AC
  Administered 2014-10-02: 30 mg via INTRAVENOUS
  Filled 2014-10-02: qty 1

## 2014-10-02 MED ORDER — POLYETHYLENE GLYCOL 3350 17 G PO PACK: 17.0000 g | PACK | Freq: Every day | ORAL | Status: DC | PRN

## 2014-10-02 MED ORDER — DIPHENHYDRAMINE HCL 50 MG/ML IJ SOLN
25.0000 mg | INTRAMUSCULAR | Status: DC | PRN
  Administered 2014-10-02 – 2014-10-04 (×6): 25 mg via INTRAVENOUS
  Filled 2014-10-02 (×12): qty 0.5

## 2014-10-02 MED ORDER — SODIUM CHLORIDE 0.9 % IV BOLUS (SEPSIS)
1000.0000 mL | Freq: Once | INTRAVENOUS | Status: AC
  Administered 2014-10-02: 1000 mL via INTRAVENOUS

## 2014-10-02 MED ORDER — POTASSIUM CHLORIDE CRYS ER 20 MEQ PO TBCR
20.0000 meq | EXTENDED_RELEASE_TABLET | Freq: Every morning | ORAL | Status: DC
  Administered 2014-10-02 – 2014-10-04 (×3): 20 meq via ORAL
  Filled 2014-10-02 (×3): qty 1

## 2014-10-02 MED ORDER — SODIUM CHLORIDE 0.9 % IV SOLN
Freq: Once | INTRAVENOUS | Status: AC
  Administered 2014-10-02: 12:00:00 via INTRAVENOUS

## 2014-10-02 MED ORDER — DIPHENHYDRAMINE HCL 25 MG PO CAPS
25.0000 mg | ORAL_CAPSULE | Freq: Four times a day (QID) | ORAL | Status: DC | PRN
  Filled 2014-10-02: qty 1

## 2014-10-02 MED ORDER — SODIUM CHLORIDE 0.9 % IJ SOLN: 9.0000 mL | INTRAMUSCULAR | Status: DC | PRN

## 2014-10-02 MED ORDER — NALOXONE HCL 0.4 MG/ML IJ SOLN: 0.4000 mg | INTRAMUSCULAR | Status: DC | PRN

## 2014-10-02 MED ORDER — METOPROLOL TARTRATE 25 MG PO TABS
25.0000 mg | ORAL_TABLET | Freq: Every day | ORAL | Status: DC
  Administered 2014-10-02 – 2014-10-04 (×3): 25 mg via ORAL
  Filled 2014-10-02 (×3): qty 1

## 2014-10-02 MED ORDER — VITAMIN D3 25 MCG (1000 UNIT) PO TABS
2000.0000 [IU] | ORAL_TABLET | Freq: Every day | ORAL | Status: DC
  Administered 2014-10-02 – 2014-10-04 (×3): 2000 [IU] via ORAL
  Filled 2014-10-02 (×6): qty 2

## 2014-10-02 NOTE — ED Notes (Signed)
Bed: WA05 Expected date:  Expected time:  Means of arrival:  Comments: 

## 2014-10-02 NOTE — ED Notes (Signed)
MD at bedside. 

## 2014-10-02 NOTE — ED Notes (Addendum)
Pt c/o left rib pain. Deniies falling. Feels this pain is due to [facility]

## 2014-10-02 NOTE — H&P (Signed)
Hospital Admission Note Date: 10/02/2014  Patient name: [name] Medical record number: [id number] Date of birth: 11/05/1980 Age: 34 y.o. Gender: male PCP: [name], MD  Attending physician: [name], MD  Chief Complaint:Pain in ribs and low back x 1 day.  History of Present Illness: This is an Opiate tolerant patient with Hb SS, Chronic Hypoxia, Anemia of chronic disease with baseline Hb of 7-7.5, Chronic anticoagulation for recurrent PE's and PAH. He presents with 1 day of pain in ribs and low back. He reports pain at an intensity of 8/10, throbbing in nature and non-radiating. A review of ED records shows that he has received several doses of Dilaudid 1 mg which according to patient have not been effective. He denies any N/V/D, pedal edema, F/C, bleeding, CP, increased DOE or dizziness. Incidental findings in the ED are Hb of 5.8, and Cr. Increasedfrom 0.83 to 1.29. The EDP spoke with the [facility] who denied admission to [facility] due to severity of illness. I am now asked to admit patient to the Hospital.  Scheduled Meds:  Continuous Infusions:  PRN Meds:.HYDROmorphone (DILAUDID) injection Allergies: Review of patient's allergies indicates no known allergies. Past Medical History  Diagnosis Date  . Sickle cell anemia   . Blood transfusion   . Acute embolism and thrombosis of right internal jugular vein   . Hypokalemia   . Mood disorder   . History of pulmonary embolus (PE)   . Avascular necrosis   . Leukocytosis     Chronic  . Thrombocytosis     Chronic  . Hypertension   . History of Clostridium difficile infection   . Uses marijuana   . Chronic anticoagulation   . Functional asplenia   . Former smoker   . Second hand tobacco smoke exposure   . Alcohol consumption of one to four drinks per day   . Noncompliance with medication regimen   . Sickle-cell crisis with associated acute chest syndrome 05/13/2013  . Acute chest syndrome 06/18/2013   . Demand ischemia 01/02/2014   Past Surgical History  Procedure Laterality Date  . Right hip replacement      08/2006  . Cholecystectomy      01/2008  . Porta cath placement    . Porta cath removal    . Umbilical hernia repair      01/2008  . Excision of left periauricular cyst      10/2009  . Excision of right ear lobe cyst with primary closur      11/2007  . Portacath placement  01/05/2012    Procedure: INSERTION PORT-A-CATH;  Surgeon: Odis Hollingshead, MD;  Location: [facility];  Service: General;  Laterality: N/A;  ultrasound guiced port a cath insertion with fluoroscopy   Family History  Problem Relation Age of Onset  . Sickle cell trait Mother   . Depression Mother   . Diabetes Mother   . Sickle cell trait Father   . Sickle cell trait Brother    Social History   Social History  . Marital Status: Single    Spouse Name: N/A  . Number of Children: 0  . Years of Education: 13   Occupational History  . 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 Smoking status: Former Smoker -- 13 years    Quit date: 07/08/2010  . Smokeless tobacco: Never Used  . Alcohol Use: No  . Drug Use:  2.00 per week    Special: Marijuana  . Sexual Activity:    Partners: Female    Museum/gallery curator: None     Comment: month ago   Other Topics Concern  . Not on file   Social History Narrative   Lives in an apartment.  Single.  Lives alone but has a girlfriend that helps care for him.  Does not use any assist devices.        [name]:  [phone] Mom, emergency contact   Review of Systems: A comprehensive review of systems was negative except as noted in the HPI. Physical Exam:  Intake/Output Summary (Last 24 hours) at 10/02/14 1005 Last data filed at 10/02/14 0630  Gross per 24 hour  Intake    222 ml  Output      0 ml  Net    222 ml   General: Alert, awake, oriented x3, in mold distress secondary to pain.  HEENT:  /AT PEERL, EOMI, anicteric Neck: Trachea midline,  no masses, no thyromegal,y no JVD, no carotid bruit OROPHARYNX:  Moist, No exudate/ erythema/lesions.  Heart: Regular rate and rhythm, without murmurs, however has a prominent P2.  Lungs: Clear to auscultation, no wheezing or rhonchi noted. No increased vocal fremitus resonant to percussion  Abdomen: Soft, nontender, nondistended, positive bowel sounds, no masses no hepatosplenomegaly noted.  Neuro: No focal neurological deficits noted cranial nerves II through XII grossly intact. Strength at functional baseline in bilateral upper and lower extremities. Musculoskeletal: No warm swelling or erythema around joints, no spinal tenderness noted. Psychiatric: Patient alert and oriented x3, good insight and cognition, good recent to remote recall.   Lab results:  Recent Labs  10/02/14 0427  NA 136  K 3.5  CL 108  CO2 23  GLUCOSE 118*  BUN 16  CREATININE 1.29*  CALCIUM 8.7*    Recent Labs  10/02/14 0427  AST 33  ALT 26  ALKPHOS 69  BILITOT 2.1*  PROT 7.3  ALBUMIN 3.7   No results for input(s): LIPASE, AMYLASE in the last 72 hours.  Recent Labs  10/02/14 0427  WBC 15.9*  NEUTROABS 11.4*  HGB 5.8*  HCT 17.6*  MCV 96.7  PLT 459*   No results for input(s): CKTOTAL, CKMB, CKMBINDEX, TROPONINI in the last 72 hours. Invalid input(s): POCBNP No results for input(s): DDIMER in the last 72 hours. No results for input(s): HGBA1C in the last 72 hours. No results for input(s): CHOL, HDL, LDLCALC, TRIG, CHOLHDL, LDLDIRECT in the last 72 hours. No results for input(s): TSH, T4TOTAL, T3FREE, THYROIDAB in the last 72 hours.  Invalid input(s): FREET3  Recent Labs  10/02/14 0427  RETICCTPCT 8.8*   Imaging results:  Dg Chest 1 View  09/10/2014   CLINICAL DATA:  Respiratory failure. Hypoxemia. Sickle cell patient.  EXAM: CHEST  1 VIEW  COMPARISON:  Radiographs and CT earlier today.  FINDINGS: 1813 hour. Stable cardiomegaly. The  left subclavian Port-A-Cath is unchanged at the SVC right atrial junction. Stable chronic vascular congestion and basilar scarring. No superimposed edema, confluent airspace opacity or significant pleural effusion. 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  IMPRESSION: Stable examination compared with earlier study today. Chronic cardiomegaly and chronic vascular congestion.   Electronically Signed   By: Richardean Sale M.D.   On: 09/10/2014 18:27   Dg Chest 2 View  09/10/2014   CLINICAL DATA:  Acute onset of sickle cell pain crisis. Decreased O2 saturation. Worsening generalized weakness. Initial encounter.  EXAM: CHEST  2 VIEW  COMPARISON:  Chest radiograph from 08/29/2014  FINDINGS: The lungs are well-aerated. Mild vascular congestion is noted. Minimal bilateral atelectasis or scarring is noted. There is no evidence of pleural effusion or pneumothorax.  The heart is enlarged. A left-sided chest port is noted ending about the cavoatrial junction. No acute osseous abnormalities are seen. Clips are noted within the right upper quadrant, reflecting prior cholecystectomy.  IMPRESSION: Mild vascular congestion and cardiomegaly noted. Minimal bilateral atelectasis or scarring noted.   Electronically Signed   By: Garald Balding M.D.   On: 09/10/2014 06:00   Ct Angio Chest Pe W/cm &/or Wo Cm  09/10/2014   CLINICAL DATA:  Short of breath.  EXAM: CT ANGIOGRAPHY CHEST WITH CONTRAST  TECHNIQUE: Multidetector CT imaging of the chest was performed using the standard protocol during bolus administration of intravenous contrast. Multiplanar CT image reconstructions and MIPs were obtained to evaluate the vascular anatomy.  CONTRAST:  151mL OMNIPAQUE IOHEXOL 350 MG/ML SOLN  COMPARISON:  08/29/2014  FINDINGS: Mediastinum: There is moderate cardiac enlargement. No pericardial effusion. The trachea is patent and appears midline. Normal appearance of the esophagus. Prominent soft tissue is identified within the  anterior mediastinum. Stress set prominent anterior mediastinal adenopathy is again identified. Index pre-vascular lymph node measures 1 cm, image 35/series 4. Previously 1.2 cm. Bilateral lower lobe pulmonary artery filling defects are again noted and are in the same distribution as the previous exam. No new pulmonary emboli identified.  Lungs/Pleura: There is no pleural effusion. Mosaic attenuation pattern is identified throughout both lungs compatible with pulmonary emboli. No airspace consolidation. Scarring is noted predominantly involving the lower lobes.  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 Diffuse increased attenuation throughout the liver is noted. The visualized adrenal glands are unremarkable.  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  Review of the MIP images confirms the above findings.  IMPRESSION: 1. Again noted are chronic bilateral lower lobe pulmonary emboli. No new areas of pulmonary emboli identified. 2. Cardiac enlargement 3. Pulmonary and osseous stigmata of sickle cell disease.   Electronically Signed   By: Kerby Moors M.D.   On: 09/10/2014 09:00   Nm Pulmonary Perf And Vent  09/15/2014   CLINICAL DATA:  Hypoxia. Pulmonary embolus on demonstrated on CT of 09/10/2014.  EXAM: NUCLEAR MEDICINE VENTILATION - PERFUSION LUNG SCAN  TECHNIQUE: Ventilation images were obtained in multiple projections using inhaled aerosol Tc-52m DTPA. Perfusion images were obtained in multiple projections after intravenous injection of Tc-74m MAA.  RADIOPHARMACEUTICALS:  Thirty-eight mCi Technetium-35m DTPA aerosol inhalation and 5 mCi Technetium-1m MAA IV  COMPARISON:  CT 09/18/2014  FINDINGS: Ventilation: There is moderate decreased ventilation to the LEFT lower lobe posteriorly.  Perfusion: There is decreased regional perfusion to the LEFT lower lobe  posteriorly which matches the ventilation defect but is slightly more pronounced and corresponds with chronic pulmonary emboli seen on comparison CT. There additional small peripheral perfusion defects in the LEFT and RIGHT lungs.  IMPRESSION: 1. No clear evidence of acute pulmonary embolism. 2. Decreased perfusion and ventilation to the LEFT lower lobe with the profusion decreased to a greater degree than the ventilation corresponds to the chronic pulmonary emboli seen on comparison CT. 3. Multiple small peripheral perfusion defects also consistent chronic PE.   Electronically Signed   By: Suzy Bouchard M.D.   On: 09/15/2014 16:24   Dg Chest [facility] 1 [phone]  09/12/2014   CLINICAL DATA:  Respiratory failure.  EXAM: PORTABLE CHEST - 1 VIEW  COMPARISON:  Chest radiograph from 1 day prior.  FINDINGS: Left subclavian central venous catheter terminates near the cavoatrial junction. Stable cardiomediastinal silhouette with mild to moderate cardiomegaly. No pneumothorax. No pleural effusion. There is stable mild to moderate pulmonary edema. No new focal lung opacity. Stable mild scarring versus atelectasis in the mid to lower left lung.  IMPRESSION: 1. Stable mild to moderate congestive heart failure. 2. Stable mild scarring versus atelectasis in the basilar left lung.   Electronically Signed   By: Ilona Sorrel M.D.   On: 09/12/2014 09:32   Dg Chest Port 1 View  09/11/2014   CLINICAL DATA:  Respiratory failure.  EXAM: PORTABLE CHEST - 1 VIEW  COMPARISON:  09/10/2014.  FINDINGS: PowerPort catheter noted in good anatomic position. Mediastinum and hilar structures normal. Cardiomegaly with mild pulmonary vascular prominence and interstitial prominence. Mild component of congestive heart failure cannot be excluded. No pleural effusion or pneumothorax.  IMPRESSION: 1. Power port catheter noted with tip in stable position.  2. Severe cardiomegaly again noted. Mild pulmonary vascular prominence interstitial prominence. Mild  component of congestive heart failure cannot be excluded.   Electronically Signed   By: Marcello Moores  Register   On: 09/11/2014 07:14     Assessment and Plan: 1. Anemia of Chronic Disease: Pt has a baseline Hb of 7-7.5 g/dl. He will be transfused 1 Unit RBC and may require trnafusion of more than 1 unit. I doubt that this is Hydrea effect as his bone marrow activity is reflected in increased bone marrow activity in response to increased anemia. I suspect that he may have had an increased hemolysis as reflected in the increased LDH noted when he was seen in the clinic 4 days ago. I will hold hydrea until Hb recovers. 2. Acute renal insufficiency: Although his GFR is >60, he has a almost 50% decrease in GFR. Will hold any further NSAIDs, hold Lisinopril and continue Hydration. Also check strict I&O and follow renal function. This may reflect decreased Pe-load and if so should show improvement after transfusion and hydration. 3. Hb SS with crisis: Will place patient on individualized dose of PCA. Ho,d and NSAIDs and continue Hydration. Also continue MS Contin.  4. Chronic Anticoagulation: Continue Lovenox. 5. PAH: Pt appears to be well compensated from a standpoint of PAH. Will continue Oxygen supllementation to keep saturations above 95%.  6. Leukocytosis: Pt shows no signs of infection at this time. May reflect crisis and bone marrow turnover. 7. Chronic hypoxic Respiratory Failure: Continue Oxygen as noted above. 8. Psychosocial: Pt has not followed up with CHF clinic as recommended on last admission. He reports that he has a difficult time keeping up with all of his appointments and managing his disease. I've discussed with patient that his disease has become more complex and he is willing to accept services from [facility] which in hte past he has refused. Will consult [facility].  Emile Kyllo A. 10/02/2014, 10:05 AM

## 2014-10-02 NOTE — ED Notes (Signed)
Pt c/o bilateral leg and back pain since Sat;pt states that this is typical of his Sickle Cell Pain

## 2014-10-02 NOTE — ED Provider Notes (Signed)
CSN: [id number]   Arrival date & time 10/02/14 0309  History  This chart was scribed for Julianne Rice, MD by Altamease Oiler, ED Scribe. This patient was seen in room WA05/WA05 and the patient's care was started at 3:29 AM.  Chief Complaint  Patient presents with  . Sickle Cell Pain Crisis    HPI The history is provided by the patient. No language interpreter was used.   [name] is a 34 y.o. male with PMHx of sickle cell anemia, avascular necrosis, and acute chest syndrome who presents to the Emergency Department complaining of a sickle cell pain crisis with gradual onset 5 days ago. Associated symptoms include intermittent bilateral thigh and side pain. He rates the pain 8/10 in severity and describes it as aching/throbbing. This pain is typical of his sickle cell pain crises. Dilaudid and morphine have provided insufficient pain relief PTA. Pt denies chest pain, SOB, and back pain. Since being discharged from the hospital he has been compliant with his anticoagulation medication. PCP: Dr. Alyson Ingles.   Per pt, his symptoms usually improve after Dilaudid, Toradol, Benadryl, and IVF in the ED.  Past Medical History  Diagnosis Date  . Sickle cell anemia   . Blood transfusion   . Acute embolism and thrombosis of right internal jugular vein   . Hypokalemia   . Mood disorder   . History of pulmonary embolus (PE)   . Avascular necrosis   . Leukocytosis     Chronic  . Thrombocytosis     Chronic  . Hypertension   . History of Clostridium difficile infection   . Uses marijuana   . Chronic anticoagulation   . Functional asplenia   . Former smoker   . Second hand tobacco smoke exposure   . Alcohol consumption of one to four drinks per day   . Noncompliance with medication regimen   . Sickle-cell crisis with associated acute chest syndrome 05/13/2013  . Acute chest syndrome 06/18/2013  . Demand ischemia 01/02/2014    Past Surgical History  Procedure Laterality Date  . Right hip  replacement      08/2006  . Cholecystectomy      01/2008  . Porta cath placement    . Porta cath removal    . Umbilical hernia repair      01/2008  . Excision of left periauricular cyst      10/2009  . Excision of right ear lobe cyst with primary closur      11/2007  . Portacath placement  01/05/2012    Procedure: INSERTION PORT-A-CATH;  Surgeon: Odis Hollingshead, MD;  Location: [facility];  Service: General;  Laterality: N/A;  ultrasound guiced port a cath insertion with fluoroscopy    Family History  Problem Relation Age of Onset  . Sickle cell trait Mother   . Depression Mother   . Diabetes Mother   . Sickle cell trait Father   . Sickle cell trait Brother     Social History  Substance Use Topics  . Smoking status: Former Smoker -- 13 years    Quit date: 07/08/2010  . Smokeless tobacco: Never Used  . Alcohol Use: No     Review of Systems  Constitutional: Negative for fever and chills.  Respiratory: Negative for shortness of breath.   Cardiovascular: Negative for chest pain.  Gastrointestinal: Negative for nausea, vomiting and abdominal pain.  Musculoskeletal: Positive for myalgias and arthralgias. Negative for neck pain and neck stiffness.  Skin: Negative for rash and wound.  Neurological: Negative for dizziness, weakness, light-headedness, numbness and headaches.  All other systems reviewed and are negative.   Home Medications   Prior to Admission medications   Medication Sig Start Date End Date Taking? Authorizing Provider  Cholecalciferol (VITAMIN D) 2000 UNITS tablet Take 1 tablet (2,000 Units total) by mouth daily. 02/13/14  Yes [name], MD  diltiazem (CARDIZEM CD) 120 MG 24 hr capsule Take 1 capsule (120 mg total) by mouth daily. 03/13/14  Yes Costin Karlyne Greenspan, MD  enoxaparin (LOVENOX) 150 MG/ML injection Inject 0.73 mLs (110 mg total) into the skin daily. 09/26/14  Yes Micheline Chapman, NP  folic acid (FOLVITE) 1 MG tablet Take 1 tablet (1 mg total) by  mouth every morning. 10/08/13  Yes [name], MD  HYDROmorphone (DILAUDID) 4 MG tablet Take 1 tablet (4 mg total) by mouth every 4 (four) hours as needed for severe pain. 09/09/14  Yes Tresa Garter, MD  hydroxyurea (HYDREA) 500 MG capsule Take 3 capsules (1,500 mg total) by mouth daily. May take with food to minimize GI side effects. 09/16/14  Yes Tresa Garter, MD  lisinopril (PRINIVIL,ZESTRIL) 10 MG tablet Take 1 tablet (10 mg total) by mouth daily. 01/22/14  Yes [name], MD  metoprolol tartrate (LOPRESSOR) 25 MG tablet Take 1 tablet (25 mg total) by mouth daily. 08/22/13  Yes [name], MD  morphine (MS CONTIN) 30 MG 12 hr tablet Take 1 tablet (30 mg total) by mouth every 12 (twelve) hours. 09/09/14  Yes Tresa Garter, MD  potassium chloride SA (K-DUR,KLOR-CON) 20 MEQ tablet Take 1 tablet (20 mEq total) by mouth every morning. 06/09/14  Yes [name], MD  zolpidem (AMBIEN) 10 MG tablet Take 1 tablet (10 mg total) by mouth at bedtime as needed for sleep. 09/19/14  Yes Micheline Chapman, NP  aspirin 81 MG chewable tablet Chew 1 tablet (81 mg total) by mouth daily. Patient not taking: Reported on 10/02/2014 07/23/14   [name], MD    Allergies  Review of patient's allergies indicates no known allergies.  Triage Vitals: BP 111/67 mmHg  Pulse 106  Temp(Src) 98.7 F (37.1 C) (Oral)  Resp 18  Ht 6' (1.829 m)  Wt 163 lb (73.936 kg)  BMI 22.10 kg/m2  SpO2 98%  Physical Exam  Constitutional: He is oriented to person, place, and time. He appears well-developed and well-nourished. No distress.  HENT:  Head: Normocephalic and atraumatic.  Mouth/Throat: Oropharynx is clear and moist.  Eyes: EOM are normal. Pupils are equal, round, and reactive to light.  Neck: Normal range of motion. Neck supple.  Cardiovascular: Normal rate and regular rhythm.   Pulmonary/Chest: Effort normal and breath sounds normal. No respiratory distress. He has no  wheezes. He has no rales. He exhibits no tenderness.  Abdominal: Soft. Bowel sounds are normal. He exhibits no distension and no mass. There is no tenderness. There is no rebound and no guarding.  Musculoskeletal: Normal range of motion. He exhibits no edema or tenderness.  No midline thoracic or lumbar tenderness to palpation. Distal pulses intact.  Neurological: He is alert and oriented to person, place, and time.  Moves all extremities without deficit. Sensation grossly intact.  Skin: Skin is warm and dry. No rash noted. No erythema.  Psychiatric: He has a normal mood and affect. His behavior is normal.  Nursing note and vitals reviewed.   ED Course  Procedures   DIAGNOSTIC STUDIES: Oxygen Saturation is 98%  on RA, normal by my interpretation.    COORDINATION OF CARE: 3:33 AM Discussed treatment plan which includes pain management with pt at bedside and pt agreed to plan.  Labs Reviewed  CBC WITH DIFFERENTIAL/PLATELET - Abnormal; Notable for the following:    WBC 15.9 (*)    RBC 1.82 (*)    Hemoglobin 5.8 (*)    HCT 17.6 (*)    RDW 20.5 (*)    Platelets 459 (*)    Neutro Abs 11.4 (*)    Monocytes Absolute 1.2 (*)    All other components within normal limits  COMPREHENSIVE METABOLIC PANEL - Abnormal; Notable for the following:    Glucose, Bld 118 (*)    Creatinine, Ser 1.29 (*)    Calcium 8.7 (*)    Total Bilirubin 2.1 (*)    All other components within normal limits  RETICULOCYTES - Abnormal; Notable for the following:    Retic Ct Pct 8.8 (*)    RBC. 1.82 (*)    All other components within normal limits  MAGNESIUM - Abnormal; Notable for the following:    Magnesium 1.6 (*)    All other components within normal limits  CBC WITH DIFFERENTIAL/PLATELET - Abnormal; Notable for the following:    WBC 12.6 (*)    RBC 2.09 (*)    Hemoglobin 6.6 (*)    HCT 20.0 (*)    RDW 19.2 (*)    Platelets 444 (*)    Neutro Abs 7.9 (*)    Monocytes Absolute 1.3 (*)    All other  components within normal limits  BASIC METABOLIC PANEL - Abnormal; Notable for the following:    Sodium 134 (*)    CO2 21 (*)    Glucose, Bld 116 (*)    Calcium 8.6 (*)    All other components within normal limits  LACTATE DEHYDROGENASE - Abnormal; Notable for the following:    LDH 371 (*)    All other components within normal limits  BASIC METABOLIC PANEL - Abnormal; Notable for the following:    Sodium 131 (*)    CO2 21 (*)    Glucose, Bld 113 (*)    Calcium 8.7 (*)    All other components within normal limits  CBC WITH DIFFERENTIAL/PLATELET - Abnormal; Notable for the following:    WBC 12.5 (*)    RBC 2.02 (*)    Hemoglobin 6.5 (*)    HCT 19.3 (*)    RDW 19.7 (*)    Neutro Abs 8.4 (*)    Monocytes Absolute 1.2 (*)    All other components within normal limits  RETICULOCYTES - Abnormal; Notable for the following:    Retic Ct Pct 6.6 (*)    RBC. 2.02 (*)    All other components within normal limits  MRSA PCR SCREENING  TYPE AND SCREEN  PREPARE RBC (CROSSMATCH)    I, Julianne Rice, MD, personally reviewed and evaluated these images and lab results as part of my medical decision-making.  Imaging Review No results found.  EKG Interpretation  Date/Time:    Ventricular Rate:    PR Interval:    QRS Duration:   QT Interval:    QTC Calculation:   R Axis:     Text Interpretation:         MDM   Final diagnoses:  None     I personally performed the services described in this documentation, which was scribed in my presence. The recorded information has been reviewed and is accurate.  Patient with persistent pain and worsening anemia. Refused admission. States he would like to be seen in the sickle cell clinic.   Julianne Rice, MD 10/05/14 2300

## 2014-10-02 NOTE — ED Notes (Signed)
Nurse drawing labs. 

## 2014-10-03 DIAGNOSIS — D599 Acquired hemolytic anemia, unspecified: Secondary | ICD-10-CM

## 2014-10-03 LAB — CBC WITH DIFFERENTIAL/PLATELET
BASOS ABS: 0.1 10*3/uL (ref 0.0–0.1)
Basophils Relative: 1 % (ref 0–1)
Eosinophils Absolute: 0.6 10*3/uL (ref 0.0–0.7)
Eosinophils Relative: 5 % (ref 0–5)
HEMATOCRIT: 20 % — AB (ref 39.0–52.0)
Hemoglobin: 6.6 g/dL — CL (ref 13.0–17.0)
LYMPHS PCT: 22 % (ref 12–46)
Lymphs Abs: 2.7 10*3/uL (ref 0.7–4.0)
MCH: 31.6 pg (ref 26.0–34.0)
MCHC: 33 g/dL (ref 30.0–36.0)
MCV: 95.7 fL (ref 78.0–100.0)
MONO ABS: 1.3 10*3/uL — AB (ref 0.1–1.0)
Monocytes Relative: 10 % (ref 3–12)
NEUTROS ABS: 7.9 10*3/uL — AB (ref 1.7–7.7)
Neutrophils Relative %: 63 % (ref 43–77)
Platelets: 444 10*3/uL — ABNORMAL HIGH (ref 150–400)
RBC: 2.09 MIL/uL — AB (ref 4.22–5.81)
RDW: 19.2 % — AB (ref 11.5–15.5)
WBC: 12.6 10*3/uL — ABNORMAL HIGH (ref 4.0–10.5)

## 2014-10-03 LAB — BASIC METABOLIC PANEL
ANION GAP: 7 (ref 5–15)
BUN: 11 mg/dL (ref 6–20)
CHLORIDE: 106 mmol/L (ref 101–111)
CO2: 21 mmol/L — AB (ref 22–32)
Calcium: 8.6 mg/dL — ABNORMAL LOW (ref 8.9–10.3)
Creatinine, Ser: 0.81 mg/dL (ref 0.61–1.24)
GFR calc Af Amer: >60 mL/min (ref >60)
GFR calc non Af Amer: >60 mL/min (ref >60)
GLUCOSE: 116 mg/dL — AB (ref 65–99)
POTASSIUM: 3.8 mmol/L (ref 3.5–5.1)
Sodium: 134 mmol/L — ABNORMAL LOW (ref 135–145)

## 2014-10-03 LAB — MRSA PCR SCREENING: MRSA by PCR: NEGATIVE

## 2014-10-03 LAB — LACTATE DEHYDROGENASE: LDH: 371 U/L — ABNORMAL HIGH (ref 98–192)

## 2014-10-03 MED ORDER — LISINOPRIL 10 MG PO TABS
10.0000 mg | ORAL_TABLET | Freq: Every day | ORAL | Status: DC
  Administered 2014-10-03 – 2014-10-04 (×2): 10 mg via ORAL
  Filled 2014-10-03 (×2): qty 1

## 2014-10-03 MED ORDER — HYDROMORPHONE HCL 4 MG PO TABS
4.0000 mg | ORAL_TABLET | ORAL | Status: DC
  Administered 2014-10-03 – 2014-10-04 (×4): 4 mg via ORAL
  Filled 2014-10-03 (×4): qty 1

## 2014-10-03 NOTE — Clinical Documentation Improvement (Signed)
Hospitalist  Can the diagnosis of CKD be further specified?   CKD Stage I - GFR greater than or equal to 90  CKD Stage II - GFR 60-89  CKD Stage III - GFR 30-59  CKD Stage IV - GFR 15-29  CKD Stage V - GFR < 15  ESRD (End Stage Renal Disease)  Other condition  Unable to clinically determine   Supporting Information: : (risk factors, signs and symptoms, diagnostics, treatment)  Acute renal insufficiency: Although his GFR is >60, he has a almost 50% decrease in GFR. Will hold any further NSAIDs, hold Lisinopril and continue Hydration. Also check strict I&O and follow renal function. This may reflect decreased Pe-load and if so should show improvement after transfusion and hydration per 9/01 progress notes.                            (ARI codes to disorder of kidney and ureter, unspecified).   Please exercise your independent, professional judgment when responding. A specific answer is not anticipated or expected.   Thank You, [place]

## 2014-10-03 NOTE — Progress Notes (Signed)
SICKLE CELL SERVICE PROGRESS NOTE  [name] ZDG:[id number] DOB: Nov 19, 1980 DOA: 10/02/2014 PCP: [name], MD  Assessment/Plan: Active Problems:   Chronic anticoagulation   PAH (pulmonary artery hypertension)  1. Hb SS with crisis: Pt reports pain much more improved today and rating pain at 7/10. He has used 24.6 mg with 41/41:demands/deliveries. Will schedule short acting Oxycodone and continue MS Contin. Continue PCA for PRN use and anticipate discharge home tomorrow. 2. Leukocytosis: Improved. Likely related to crisis. 3. Acute Kidney Injury: I feel that this was multifactorial including volume depletion and use of NSAID and ACE in a setting of volume depletion. Renal function is back t baseline after transfusion and hydration. Will resume Lisinopril and he should have renal function checked in 2 weeks in the out patient setting. Continue to maintain good hydration. 4. Anemia of Chronic Disease with acute component: The etiology is unclear but labs suggest an acute hemolysis associated with crisis. He was transfused 1 unit RBC's and Hb is currently at baseline of 6.8. Will resume Hydrea as long as Hb remains stable to morrow. 5. Chronic Anticoagulation: Continue Lovenox at current dose. 6. PAH: Currently well compensated. 7. Chronic hypoxic Respiratory Failure. Currently well compensated. 8. Chronic pain: Continue MS Contin. 9. Psychosocial: Refer to [facility] and Follow up with [place] [facility] services  Code Status: Full Code Family Communication: N/A Disposition Plan: Not yet ready for discharge  [place].  Pager [phone]. If 7PM-7AM, please contact night-coverage.  10/03/2014, 12:41 PM  LOS: 1 day   Consultants:  None  Procedures:  None  Antibiotics:  None  HPI/Subjective: Pt reports pain much more improved today and rating pain at 7/10 and localized to his ribs.  Objective: Filed Vitals:   10/03/14 0538 10/03/14 0809 10/03/14 1009 10/03/14 1211   BP: 112/70  111/70   Pulse: 80  81   Temp: 98.2 F (36.8 C)  98 F (36.7 C)   TempSrc: Oral  Oral   Resp: 18 18 22 14   Height:      Weight: 163 lb (73.936 kg)     SpO2: 96% 96% 98% 94%   Weight change: 0 lb (0 kg)  Intake/Output Summary (Last 24 hours) at 10/03/14 1241 Last data filed at 10/03/14 1009  Gross per 24 hour  Intake 2660.73 ml  Output   1000 ml  Net 1660.73 ml    General: Alert, awake, oriented x3, in no acute distress.  HEENT: [place]/AT PEERL, EOMI, mild icterus Neck: Trachea midline,  no masses, no thyromegal,y no JVD, no carotid bruit OROPHARYNX:  Moist, No exudate/ erythema/lesions.  Heart: Regular rate and rhythm, with ;oud P2. Lungs: Clear to auscultation, no wheezing or rhonchi noted. No increased vocal fremitus resonant to percussion  Abdomen: Soft, nontender, nondistended, positive bowel sounds, no masses no hepatosplenomegaly noted..  Neuro: No focal neurological deficits noted cranial nerves II through XII grossly intact.  Strength at functional baseline in bilateral upper and lower extremities. Musculoskeletal: No warm swelling or erythema around joints, no spinal tenderness noted. Psychiatric: Patient alert and oriented x3, good insight and cognition, good recent to remote recall.    Data Reviewed: Basic Metabolic Panel:  Recent Labs Lab 10/02/14 0427 10/03/14 0415  NA 136 134*  K 3.5 3.8  CL 108 106  CO2 23 21*  GLUCOSE 118* 116*  BUN 16 11  CREATININE 1.29* 0.81  CALCIUM 8.7* 8.6*  MG 1.6*  --    Liver Function Tests:  Recent Labs Lab 10/02/14 0427  AST 33  ALT 26  ALKPHOS 69  BILITOT 2.1*  PROT 7.3  ALBUMIN 3.7   No results for input(s): LIPASE, AMYLASE in the last 168 hours. No results for input(s): AMMONIA in the last 168 hours. CBC:  Recent Labs Lab 10/02/14 0427 10/03/14 0415  WBC 15.9* 12.6*  NEUTROABS 11.4* 7.9*  HGB 5.8* 6.6*  HCT 17.6* 20.0*  MCV 96.7 95.7  PLT 459* 444*   Cardiac Enzymes: No results for  input(s): CKTOTAL, CKMB, CKMBINDEX, TROPONINI in the last 168 hours. BNP (last 3 results)  Recent Labs  08/29/14 0810 09/10/14 0612  BNP 1117.0* 549.5*    ProBNP (last 3 results)  Recent Labs  01/01/14 0909  PROBNP 5790.0*    CBG: No results for input(s): GLUCAP in the last 168 hours.  Recent Results (from the past 240 hour(s))  MRSA PCR Screening     Status: None   Collection Time: 10/03/14  1:18 AM  Result Value Ref Range Status   MRSA by PCR NEGATIVE NEGATIVE Final    Comment:        The GeneXpert MRSA Assay (FDA approved for NASAL specimens only), is one component of a comprehensive MRSA colonization surveillance program. It is not intended to diagnose MRSA infection nor to guide or monitor treatment for MRSA infections.      Studies: Dg Chest 1 View  09/10/2014   CLINICAL DATA:  Respiratory failure. Hypoxemia. Sickle cell patient.  EXAM: CHEST  1 VIEW  COMPARISON:  Radiographs and CT earlier today.  FINDINGS: 1813 hour. Stable cardiomegaly. The left subclavian Port-A-Cath is unchanged at the SVC right atrial junction. Stable chronic vascular congestion and basilar scarring. No superimposed edema, confluent airspace opacity or significant pleural effusion. 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  IMPRESSION: Stable examination compared with earlier study today. Chronic cardiomegaly and chronic vascular congestion.   Electronically Signed   By: Richardean Sale M.D.   On: 09/10/2014 18:27   Dg Chest 2 View  09/10/2014   CLINICAL DATA:  Acute onset of sickle cell pain crisis. Decreased O2 saturation. Worsening generalized weakness. Initial encounter.  EXAM: CHEST  2 VIEW  COMPARISON:  Chest radiograph from 08/29/2014  FINDINGS: The lungs are well-aerated. Mild vascular congestion is noted. Minimal bilateral atelectasis or scarring is noted. There is no evidence of pleural effusion or pneumothorax.  The heart is enlarged. A left-sided chest port is  noted ending about the cavoatrial junction. No acute osseous abnormalities are seen. Clips are noted within the right upper quadrant, reflecting prior cholecystectomy.  IMPRESSION: Mild vascular congestion and cardiomegaly noted. Minimal bilateral atelectasis or scarring noted.   Electronically Signed   By: Garald Balding M.D.   On: 09/10/2014 06:00   Ct Angio Chest Pe W/cm &/or Wo Cm  09/10/2014   CLINICAL DATA:  Short of breath.  EXAM: CT ANGIOGRAPHY CHEST WITH CONTRAST  TECHNIQUE: Multidetector CT imaging of the chest was performed using the standard protocol during bolus administration of intravenous contrast. Multiplanar CT image reconstructions and MIPs were obtained to evaluate the vascular anatomy.  CONTRAST:  152mL OMNIPAQUE IOHEXOL 350 MG/ML SOLN  COMPARISON:  08/29/2014  FINDINGS: Mediastinum: There is moderate cardiac enlargement. No pericardial effusion. The trachea is patent and appears midline. Normal appearance of the esophagus. Prominent soft tissue is identified within the anterior mediastinum. Stress set prominent anterior mediastinal adenopathy is again identified. Index pre-vascular lymph node measures 1 cm, image 35/series 4. Previously 1.2 cm. Bilateral lower lobe pulmonary  artery filling defects are again noted and are in the same distribution as the previous exam. No new pulmonary emboli identified.  Lungs/Pleura: There is no pleural effusion. Mosaic attenuation pattern is identified throughout both lungs compatible with pulmonary emboli. No airspace consolidation. Scarring is noted predominantly involving the lower lobes.  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 Diffuse increased attenuation throughout the liver is noted. The visualized adrenal glands are unremarkable.  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  Review of the MIP images  confirms the above findings.  IMPRESSION: 1. Again noted are chronic bilateral lower lobe pulmonary emboli. No new areas of pulmonary emboli identified. 2. Cardiac enlargement 3. Pulmonary and osseous stigmata of sickle cell disease.   Electronically Signed   By: Kerby Moors M.D.   On: 09/10/2014 09:00   Nm Pulmonary Perf And Vent  09/15/2014   CLINICAL DATA:  Hypoxia. Pulmonary embolus on demonstrated on CT of 09/10/2014.  EXAM: NUCLEAR MEDICINE VENTILATION - PERFUSION LUNG SCAN  TECHNIQUE: Ventilation images were obtained in multiple projections using inhaled aerosol Tc-68m DTPA. Perfusion images were obtained in multiple projections after intravenous injection of Tc-30m MAA.  RADIOPHARMACEUTICALS:  Thirty-eight mCi Technetium-29m DTPA aerosol inhalation and 5 mCi Technetium-64m MAA IV  COMPARISON:  CT 09/18/2014  FINDINGS: Ventilation: There is moderate decreased ventilation to the LEFT lower lobe posteriorly.  Perfusion: There is decreased regional perfusion to the LEFT lower lobe posteriorly which matches the ventilation defect but is slightly more pronounced and corresponds with chronic pulmonary emboli seen on comparison CT. There additional small peripheral perfusion defects in the LEFT and RIGHT lungs.  IMPRESSION: 1. No clear evidence of acute pulmonary embolism. 2. Decreased perfusion and ventilation to the LEFT lower lobe with the profusion decreased to a greater degree than the ventilation corresponds to the chronic pulmonary emboli seen on comparison CT. 3. Multiple small peripheral perfusion defects also consistent chronic PE.   Electronically Signed   By: Suzy Bouchard M.D.   On: 09/15/2014 16:24   Dg Chest Port 1 View  09/12/2014   CLINICAL DATA:  Respiratory failure.  EXAM: PORTABLE CHEST - 1 VIEW  COMPARISON:  Chest radiograph from 1 day prior.  FINDINGS: Left subclavian central venous catheter terminates near the cavoatrial junction. Stable cardiomediastinal silhouette with mild to  moderate cardiomegaly. No pneumothorax. No pleural effusion. There is stable mild to moderate pulmonary edema. No new focal lung opacity. Stable mild scarring versus atelectasis in the mid to lower left lung.  IMPRESSION: 1. Stable mild to moderate congestive heart failure. 2. Stable mild scarring versus atelectasis in the basilar left lung.   Electronically Signed   By: Ilona Sorrel M.D.   On: 09/12/2014 09:32   Dg Chest Port 1 View  09/11/2014   CLINICAL DATA:  Respiratory failure.  EXAM: PORTABLE CHEST - 1 VIEW  COMPARISON:  09/10/2014.  FINDINGS: PowerPort catheter noted in good anatomic position. Mediastinum and hilar structures normal. Cardiomegaly with mild pulmonary vascular prominence and interstitial prominence. Mild component of congestive heart failure cannot be excluded. No pleural effusion or pneumothorax.  IMPRESSION: 1. Power port catheter noted with tip in stable position.  2. Severe cardiomegaly again noted. Mild pulmonary vascular prominence interstitial prominence. Mild component of congestive heart failure cannot be excluded.   Electronically Signed   By: Clio   On: 09/11/2014 07:14    Scheduled Meds: . aspirin  81 mg  Oral Daily  . cholecalciferol  2,000 Units Oral Daily  . diltiazem  120 mg Oral Daily  . enoxaparin  110 mg Subcutaneous Daily  . folic acid  1 mg Oral q morning - 10a  . HYDROmorphone PCA 2 mg/mL   Intravenous 6 times per day  . metoprolol tartrate  25 mg Oral Daily  . morphine  30 mg Oral Q12H  . potassium chloride SA  20 mEq Oral q morning - 10a  . senna-docusate  1 tablet Oral BID   Continuous Infusions: . dextrose 5 % and 0.45% NaCl 100 mL/hr at 10/03/14 0100    Time spent 30 minutes

## 2014-10-03 NOTE — Consult Note (Signed)
   [facility] CM Inpatient Consult   10/03/2014  [name] 1980-02-26 [id number]   EPIC referral received for [facility] Care Management services. Cross checked for eligibility. Patient is not on the Medicare list of ACO patients that [facility] is in contract with. Therefore, patient is not eligible for [facility] Care Management services. Will make inpatient RNCM aware.   Marthenia Rolling, MSN-Ed, RN,BSN [facility] Liaison [phone]

## 2014-10-04 LAB — CBC WITH DIFFERENTIAL/PLATELET
Basophils Absolute: 0.1 10*3/uL (ref 0.0–0.1)
Basophils Relative: 1 % (ref 0–1)
EOS ABS: 0.5 10*3/uL (ref 0.0–0.7)
EOS PCT: 4 % (ref 0–5)
HCT: 19.3 % — ABNORMAL LOW (ref 39.0–52.0)
Hemoglobin: 6.5 g/dL — CL (ref 13.0–17.0)
LYMPHS ABS: 2.3 10*3/uL (ref 0.7–4.0)
Lymphocytes Relative: 19 % (ref 12–46)
MCH: 32.2 pg (ref 26.0–34.0)
MCHC: 33.7 g/dL (ref 30.0–36.0)
MCV: 95.5 fL (ref 78.0–100.0)
MONOS PCT: 10 % (ref 3–12)
Monocytes Absolute: 1.2 10*3/uL — ABNORMAL HIGH (ref 0.1–1.0)
Neutro Abs: 8.4 10*3/uL — ABNORMAL HIGH (ref 1.7–7.7)
Neutrophils Relative %: 67 % (ref 43–77)
PLATELETS: 381 10*3/uL (ref 150–400)
RBC: 2.02 MIL/uL — ABNORMAL LOW (ref 4.22–5.81)
RDW: 19.7 % — ABNORMAL HIGH (ref 11.5–15.5)
WBC: 12.5 10*3/uL — AB (ref 4.0–10.5)

## 2014-10-04 LAB — BASIC METABOLIC PANEL
Anion gap: 6 (ref 5–15)
BUN: 9 mg/dL (ref 6–20)
CALCIUM: 8.7 mg/dL — AB (ref 8.9–10.3)
CHLORIDE: 104 mmol/L (ref 101–111)
CO2: 21 mmol/L — ABNORMAL LOW (ref 22–32)
CREATININE: 0.71 mg/dL (ref 0.61–1.24)
GFR calc Af Amer: >60 mL/min (ref >60)
GFR calc non Af Amer: >60 mL/min (ref >60)
Glucose, Bld: 113 mg/dL — ABNORMAL HIGH (ref 65–99)
Potassium: 3.9 mmol/L (ref 3.5–5.1)
SODIUM: 131 mmol/L — AB (ref 135–145)

## 2014-10-04 LAB — RETICULOCYTES
RBC.: 2.02 MIL/uL — ABNORMAL LOW (ref 4.22–5.81)
RETIC CT PCT: 6.6 % — AB (ref 0.4–3.1)
Retic Count, Absolute: 133.3 10*3/uL (ref 19.0–186.0)

## 2014-10-04 MED ORDER — HEPARIN SOD (PORK) LOCK FLUSH 100 UNIT/ML IV SOLN
500.0000 [IU] | INTRAVENOUS | Status: AC | PRN
  Administered 2014-10-04: 500 [IU]

## 2014-10-04 MED ORDER — ENOXAPARIN SODIUM 120 MG/0.8ML ~~LOC~~ SOLN
110.0000 mg | Freq: Every day | SUBCUTANEOUS | Status: DC
  Administered 2014-10-04: 110 mg via SUBCUTANEOUS
  Filled 2014-10-04: qty 0.8

## 2014-10-04 NOTE — Progress Notes (Signed)
CRITICAL VALUE ALERT  Critical value received:  HGB 6.5  Date of notification:  10/04/14  Time of notification: 0608  Critical value read back:Yes  Nurse who received alert: Azzie Glatter, RN  MD notified (1st page):  N/A; Lab results consistent from previous value  Time of first page: n/a  MD notified (2nd page): n/a  Time of second page: n/a  Responding MD:  n/a  Time MD responded:  n/a

## 2014-10-04 NOTE — Discharge Summary (Signed)
Physician Discharge Summary  Patient ID: MARVELL TAMER MRN: [id number] DOB/AGE: 06/30/1980 34 y.o.  Admit date: 10/02/2014 Discharge date: 10/04/2014  Admission Diagnoses:  Discharge Diagnoses:  Active Problems:   Chronic anticoagulation   PAH (pulmonary artery hypertension)   Discharged Condition: good  Hospital Course: A 34 year old gentleman well-known to our service admitted with sickle cell painful crisis. Patient also has pulmonary hypertension was chronic anticoagulation. He was treated with IV Dilaudid PCA with Toradol and IV hydration. Patient was gradually transition to his oral home medications. His pain level went from 8 out of 10-4 out of 10 on the day of discharge. He also has been counseled to use oxygen at home as previously prescribed. Patient has not been using his oxygen prior to admission.  Consults: None  Significant Diagnostic Studies: labs: CBCs and CMP is checked. Patient did not require any transfusions  Treatments: IV hydration, analgesia: Dilaudid and anticoagulation: Lovenox  Discharge Exam: Blood pressure 116/83, pulse 93, temperature 98.9 F (37.2 C), temperature source Oral, resp. rate 19, height 6' (1.829 m), weight 73.936 kg (163 lb), SpO2 98 %. General appearance: alert, cooperative and no distress Head: Normocephalic, without obvious abnormality, atraumatic Resp: clear to auscultation bilaterally Chest wall: no tenderness Cardio: regular rate and rhythm, S1, S2 normal, no murmur, click, rub or gallop GI: soft, non-tender; bowel sounds normal; no masses,  no organomegaly Extremities: extremities normal, atraumatic, no cyanosis or edema Pulses: 2+ and symmetric Skin: Skin color, texture, turgor normal. No rashes or lesions Neurologic: Grossly normal  Disposition: 01-Home or Self Care     Medication List    TAKE these medications        aspirin 81 MG chewable tablet  Chew 1 tablet (81 mg total) by mouth daily.     diltiazem 120 MG 24  hr capsule  Commonly known as:  CARDIZEM CD  Take 1 capsule (120 mg total) by mouth daily.     enoxaparin 150 MG/ML injection  Commonly known as:  LOVENOX  Inject 0.73 mLs (110 mg total) into the skin daily.     folic acid 1 MG tablet  Commonly known as:  FOLVITE  Take 1 tablet (1 mg total) by mouth every morning.     HYDROmorphone 4 MG tablet  Commonly known as:  DILAUDID  Take 1 tablet (4 mg total) by mouth every 4 (four) hours as needed for severe pain.     hydroxyurea 500 MG capsule  Commonly known as:  HYDREA  Take 3 capsules (1,500 mg total) by mouth daily. May take with food to minimize GI side effects.     lisinopril 10 MG tablet  Commonly known as:  PRINIVIL,ZESTRIL  Take 1 tablet (10 mg total) by mouth daily.     metoprolol tartrate 25 MG tablet  Commonly known as:  LOPRESSOR  Take 1 tablet (25 mg total) by mouth daily.     morphine 30 MG 12 hr tablet  Commonly known as:  MS CONTIN  Take 1 tablet (30 mg total) by mouth every 12 (twelve) hours.     potassium chloride SA 20 MEQ tablet  Commonly known as:  K-DUR,KLOR-CON  Take 1 tablet (20 mEq total) by mouth every morning.     Vitamin D 2000 UNITS tablet  Take 1 tablet (2,000 Units total) by mouth daily.     zolpidem 10 MG tablet  Commonly known as:  AMBIEN  Take 1 tablet (10 mg total) by mouth at bedtime as needed for sleep.  Follow-up Information    Follow up with [place].   Why:  upon discharge   Contact information:   [place] [address] [phone]       Follow up with [name], MD On 10/07/2014.   Specialty:  Internal Medicine   Why:  9:30 am   Contact information:   [place] [address] [phone]       Signed: Barbette Merino 10/04/2014, 8:29 AM  Time spent 33 minutes

## 2014-10-04 NOTE — Progress Notes (Signed)
Patient discharged home, all discharge medications and instructions reviewed and questions answered. Patient to be assisted to vehicle by wheelchair.  

## 2014-10-07 ENCOUNTER — Ambulatory Visit (INDEPENDENT_AMBULATORY_CARE_PROVIDER_SITE_OTHER): Payer: Medicare Other | Admitting: Internal Medicine

## 2014-10-07 ENCOUNTER — Encounter: Payer: Self-pay | Admitting: Internal Medicine

## 2014-10-07 VITALS — BP 110/69 | HR 78 | Temp 98.5°F | Resp 14 | Ht 72.0 in | Wt 160.0 lb

## 2014-10-07 DIAGNOSIS — I270 Primary pulmonary hypertension: Secondary | ICD-10-CM

## 2014-10-07 DIAGNOSIS — I482 Chronic atrial fibrillation, unspecified: Secondary | ICD-10-CM

## 2014-10-07 DIAGNOSIS — I272 Pulmonary hypertension, unspecified: Secondary | ICD-10-CM

## 2014-10-07 DIAGNOSIS — D571 Sickle-cell disease without crisis: Secondary | ICD-10-CM | POA: Insufficient documentation

## 2014-10-07 LAB — TYPE AND SCREEN
ABO/RH(D): O POS
ANTIBODY SCREEN: NEGATIVE
UNIT DIVISION: 0
Unit division: 0

## 2014-10-07 LAB — CBC WITH DIFFERENTIAL/PLATELET
Basophils Absolute: 0.2 10*3/uL — ABNORMAL HIGH (ref 0.0–0.1)
Basophils Relative: 2 % — ABNORMAL HIGH (ref 0–1)
EOS ABS: 0.4 10*3/uL (ref 0.0–0.7)
EOS PCT: 4 % (ref 0–5)
HCT: 23.5 % — ABNORMAL LOW (ref 39.0–52.0)
Hemoglobin: 7.8 g/dL — ABNORMAL LOW (ref 13.0–17.0)
Lymphocytes Relative: 20 % (ref 12–46)
Lymphs Abs: 2.1 10*3/uL (ref 0.7–4.0)
MCH: 32 pg (ref 26.0–34.0)
MCHC: 33.2 g/dL (ref 30.0–36.0)
MCV: 96.3 fL (ref 78.0–100.0)
MPV: 8.8 fL (ref 8.6–12.4)
Monocytes Absolute: 1.4 10*3/uL — ABNORMAL HIGH (ref 0.1–1.0)
Monocytes Relative: 13 % — ABNORMAL HIGH (ref 3–12)
Neutro Abs: 6.5 10*3/uL (ref 1.7–7.7)
Neutrophils Relative %: 61 % (ref 43–77)
PLATELETS: 365 10*3/uL (ref 150–400)
RBC: 2.44 MIL/uL — AB (ref 4.22–5.81)
RDW: 20.1 % — AB (ref 11.5–15.5)
WBC: 10.6 10*3/uL — AB (ref 4.0–10.5)

## 2014-10-07 MED ORDER — HYDROMORPHONE HCL 4 MG PO TABS: 4.0000 mg | ORAL_TABLET | ORAL | Status: DC | PRN

## 2014-10-07 NOTE — Patient Instructions (Signed)
Sickle Cell Anemia, Adult Sickle cell anemia is a condition in which red blood cells have an abnormal "sickle" shape. This abnormal shape shortens the cells' life span, which results in a lower than normal concentration of red blood cells in the blood. The sickle shape also causes the cells to clump together and block free blood flow through the blood vessels. As a result, the tissues and organs of the body do not receive enough oxygen. Sickle cell anemia causes organ damage and pain and increases the risk of infection. CAUSES  Sickle cell anemia is a genetic disorder. Those who receive two copies of the gene have the condition, and those who receive one copy have the trait. RISK FACTORS The sickle cell gene is most common in people whose families originated in [place]. Other areas of the globe where sickle cell trait occurs include the Mediterranean, [place] and [place], the [place], and the [place].  SIGNS AND SYMPTOMS  Pain, especially in the extremities, back, chest, or abdomen (common). The pain may start suddenly or may develop following an illness, especially if there is dehydration. Pain can also occur due to overexertion or exposure to extreme temperature changes.  Frequent severe bacterial infections, especially certain types of pneumonia and meningitis.  Pain and swelling in the hands and feet.  Decreased activity.   Loss of appetite.   Change in behavior.  Headaches.  Seizures.  Shortness of breath or difficulty breathing.  Vision changes.  Skin ulcers. Those with the trait may not have symptoms or they may have mild symptoms.  DIAGNOSIS  Sickle cell anemia is diagnosed with blood tests that demonstrate the genetic trait. It is often diagnosed during the newborn period, due to mandatory testing nationwide. A variety of blood tests, X-rays, CT scans, MRI scans, ultrasounds, and lung function tests may also be done to monitor the condition. TREATMENT  Sickle  cell anemia may be treated with:  Medicines. You may be given pain medicines, antibiotic medicines (to treat and prevent infections) or medicines to increase the production of certain types of hemoglobin.  Fluids.  Oxygen.  Blood transfusions. HOME CARE INSTRUCTIONS   Drink enough fluid to keep your urine clear or pale yellow. Increase your fluid intake in hot weather and during exercise.  Do not smoke. Smoking lowers oxygen levels in the blood.   Only take over-the-counter or prescription medicines for pain, fever, or discomfort as directed by your health care provider.  Take antibiotics as directed by your health care provider. Make sure you finish them it even if you start to feel better.   Take supplements as directed by your health care provider.   Consider wearing a medical alert bracelet. This tells anyone caring for you in an emergency of your condition.   When traveling, keep your medical information, health care provider's names, and the medicines you take with you at all times.   If you develop a fever, do not take medicines to reduce the fever right away. This could cover up a problem that is developing. Notify your health care provider.  Keep all follow-up appointments with your health care provider. Sickle cell anemia requires regular medical care. SEEK MEDICAL CARE IF: You have a fever. SEEK IMMEDIATE MEDICAL CARE IF:   You feel dizzy or faint.   You have new abdominal pain, especially on the left side near the stomach area.   You develop a persistent, often uncomfortable and painful penile erection (priapism). If this is not treated immediately it   will lead to impotence.   You have numbness your arms or legs or you have a hard time moving them.   You have a hard time with speech.   You have a fever or persistent symptoms for more than 2-3 days.   You have a fever and your symptoms suddenly get worse.   You have signs or symptoms of infection.  These include:   Chills.   Abnormal tiredness (lethargy).   Irritability.   Poor eating.   Vomiting.   You develop pain that is not helped with medicine.   You develop shortness of breath.  You have pain in your chest.   You are coughing up pus-like or bloody sputum.   You develop a stiff neck.  Your feet or hands swell or have pain.  Your abdomen appears bloated.  You develop joint pain. MAKE SURE YOU:  Understand these instructions. Document Released: 04/27/2005 Document Revised: 06/03/2013 Document Reviewed: 08/29/2012 [facility] Patient Information 2015 [place], [place]. This information is not intended to replace advice given to you by your health care provider. Make sure you discuss any questions you have with your health care provider. Pulmonary Hypertension Pulmonary hypertension is high blood pressure within the arteries in your lungs (pulmonary arteries). It is different than having high blood pressure elsewhere in your body, such as blood pressure that is measured with a blood pressure cuff. Pulmonary hypertension makes it harder for blood to flow through the lungs. As a result, the heart must work harder to pump blood through the lungs, and it may be harder for you to breathe. Over time, this can weaken the heart muscle. Pulmonary hypertension is a serious condition and can be fatal.  CAUSES Many different medical conditions can cause pulmonary hypertension. They can be grouped into five different categories:  Group 1--Pulmonary hypertension caused by abnormal growth of small blood vessels in the lungs (pulmonary arterial hypertension). The abnormal blood vessel growth may have no known cause or may be:   Hereditary.  Caused by another disease such as a connective tissue disease (including lupus or scleroderma) or HIV.  Caused by certain drugs or toxins. Group 2--Pulmonary hypertension caused by weakness of the main chamber of the heart (left ventricle) or  heart valve disease. Group 3--Pulmonary hypertension caused by lung disease or low oxygen levels. Causes in this group include:  Emphysema or chronic obstructive pulmonary disease (COPD).  Untreated sleep apnea.  Pulmonary fibrosis. Group 4--Pulmonary hypertension caused by blood clots in the lungs (pulmonary emboli).  Group 5--Other causes of pulmonary hypertension, such as sickle cell anemia, or a mix between multiple causes. SIGNS AND SYMPTOMS  Shortness of breath. You may notice shortness of breath with:  Activity such as walking.  No activity.  Tiredness and fatigue.  Dizziness or fainting.  Rapid heartbeat or feeling the heart flutter or skip a beat (palpitations).  Neck vein enlargement.  Bluish color to the lips and fingertips. DIAGNOSIS  Various tests may be used to help diagnose pulmonary hypertension. These can include:  Chest X-ray.  Arterial blood gases. This test checks the oxygen level in your blood.  CT scans. This test can provide detailed images of your lungs.  Pulmonary function test. This test measures how much air your lungs can hold. It also tests how well air moves in and out of your lungs.  Electrocardiography. This test traces the electrical activity of your heart.  Echocardiography. This test is used to look at your heart in motion and how it  functions.  Heart catheterization. This test can measure the pressure in your pulmonary artery and the right side of the heart.  Lung biopsy. A tissue sample is sometimes taken from the lung to check for underlying causes. TREATMENT Pulmonary hypertension has no cure. Treatment is to help relieve symptoms and slow the progress of the condition. Treatment can involve:  Medicines such as:  Blood pressure medicines.  Medicines to relax (dilate) the pulmonary blood vessels.  Diuretic medicines.  Blood thinning medicines.  Surgery. For severe pulmonary hypertension that does not respond to medical  treatment, heart-lung or lung transplant may be needed. HOME CARE INSTRUCTIONS  Only take over-the-counter or prescription medicines as directed by your health care provider. Take all medicines exactly as instructed. Do not change or stop medicines without first checking with your health care provider.  Do not smoke.  Eat a healthy diet. Decrease how much salt (sodium) you eat by checking nutrition labels and using seasonings without salt. Talk to your health care provider or a dietitian about foods you should eat.  Stay as active as possible. Exercise as directed by your health care provider. Talk to your health care provider about what type of exercise is safe for you.  Avoid high altitudes.  Avoid hot tubs and saunas.  Avoid becoming pregnant, if this applies. Talk to your health care provider about safe methods of birth control.  Follow up with your health care provider as directed. SEEK IMMEDIATE MEDICAL CARE IF:  You have severe shortness of breath.  You develop chest pain or pressure.  You cough up blood.  You develop swelling of your feet or legs.  You have a significant increase in weight over 1-2 days. Document Released: 11/14/2006 Document Revised: 11/07/2012 Document Reviewed: 07/09/2012 [facility] Patient Information 2015 [place], [place]. This information is not intended to replace advice given to you by your health care provider. Make sure you discuss any questions you have with your health care provider.

## 2014-10-07 NOTE — Progress Notes (Signed)
Patient ID: NEELY CECENA, male   DOB: 01/26/1981, 34 y.o.   MRN: [id number]   Sylvan Sookdeo, is a 34 y.o. male  [id number]  [id number]  DOB - December 30, 1980  Chief Complaint  Patient presents with  . Follow-up        Subjective:   Daemon Dowty is a 34 y.o. male here today for a follow up visit. 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 Patient is here today for hospital admission follow-ups for refill of pain medication. He said his pain is manageable at this time, rated at 4 out of 10. He denies any chest pain or shortness of breath, he is on home oxygen but he said his oxygen tank needs to be refilled, he will be calling advanced home care after this encounter. Patient's hemoglobin before admission was 5.8 and his creatinine increased from 0.83-1.29, at the time of discharge his hemoglobin was 6.5 and creatinine back to baseline of 0.81. Patient claims compliant on his hydroxyurea but was discontinued while in the hospital until hemoglobin recovers. Patient is yet to restart hydroxyurea. Patient does not smoke cigarette, denies the use of illicit drugs. Patient has No headache, No chest pain, No abdominal pain - No Nausea, No new weakness tingling or numbness, No Cough - SOB.  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 Sickle cell anemia   . Blood transfusion   . Acute embolism and thrombosis of right internal jugular vein   . Hypokalemia   . Mood disorder   . History of pulmonary embolus (PE)   . Avascular necrosis   . Leukocytosis     Chronic  . Thrombocytosis     Chronic  . Hypertension   . History of Clostridium difficile infection   . Uses marijuana   .  Chronic anticoagulation   . Functional asplenia   . Former smoker   . Second hand tobacco smoke exposure   . Alcohol consumption of one to four drinks per day   . Noncompliance with medication regimen   . Sickle-cell crisis with associated acute chest syndrome 05/13/2013  . Acute chest syndrome 06/18/2013  . Demand ischemia 01/02/2014    MEDICATIONS AT HOME: Prior to Admission medications   Medication Sig Start Date End Date Taking? Authorizing Provider  Cholecalciferol (VITAMIN D) 2000 UNITS tablet Take 1 tablet (2,000 Units total) by mouth daily. 02/13/14  Yes [name], MD  diltiazem (CARDIZEM CD) 120 MG 24 hr capsule Take 1 capsule (120 mg total) by mouth daily. 03/13/14  Yes Costin Karlyne Greenspan, MD  enoxaparin (LOVENOX) 150 MG/ML injection Inject 0.73 mLs (110 mg total) into the skin daily. 09/26/14  Yes Micheline Chapman, NP  folic acid (FOLVITE) 1 MG tablet Take 1 tablet (1 mg total) by mouth every morning. 10/08/13  Yes [name], MD  HYDROmorphone (DILAUDID) 4 MG tablet Take 1 tablet (4 mg total) by mouth every 4 (four) hours as needed for severe pain. 10/07/14  Yes Tresa Garter, MD  lisinopril (PRINIVIL,ZESTRIL) 10 MG tablet Take 1 tablet (10 mg total) by mouth daily. 01/22/14  Yes [name], MD  metoprolol tartrate (LOPRESSOR) 25 MG tablet Take 1 tablet (25 mg total) by mouth daily.  08/22/13  Yes [name], MD  morphine (MS CONTIN) 30 MG 12 hr tablet Take 1 tablet (30 mg total) by mouth every 12 (twelve) hours. 09/09/14  Yes Tresa Garter, MD  potassium chloride SA (K-DUR,KLOR-CON) 20 MEQ tablet Take 1 tablet (20 mEq total) by mouth every morning. 06/09/14  Yes [name], MD  zolpidem (AMBIEN) 10 MG tablet Take 1 tablet (10 mg total) by mouth at bedtime as needed for sleep. 09/19/14  Yes Micheline Chapman, NP  aspirin 81 MG chewable tablet Chew 1 tablet (81 mg total) by mouth daily. Patient not taking: Reported on 10/02/2014 07/23/14    [name], MD  hydroxyurea (HYDREA) 500 MG capsule Take 3 capsules (1,500 mg total) by mouth daily. May take with food to minimize GI side effects. Patient not taking: Reported on 10/07/2014 09/16/14   Tresa Garter, MD     Objective:   Filed Vitals:   10/07/14 0951  BP: 110/69  Pulse: 78  Temp: 98.5 F (36.9 C)  TempSrc: Oral  Resp: 14  Height: 6' (1.829 m)  Weight: 160 lb (72.576 kg)  SpO2: 99%    Exam General appearance : Awake, alert, not in any distress. Speech Clear. Not toxic looking HEENT: Atraumatic and Normocephalic, pupils equally reactive to light and accomodation Neck: supple, no JVD. No cervical lymphadenopathy.  Chest:Good air entry bilaterally, no added sounds  CVS: S1 S2 irregular, loud P2, no murmurs.  Abdomen: Bowel sounds present, Non tender and not distended with no gaurding, rigidity or rebound. Extremities: B/L Lower Ext shows no edema, both legs are warm to touch Neurology: Awake alert, and oriented X 3, CN II-XII intact, Non focal Skin:No Rash  Data Review No results found for: HGBA1C   Assessment & Plan   1. Pulmonary HTN  - Ambulatory referral to Pulmonology  2. Hb-SS disease without crisis  - Ambulatory referral to Pulmonology - CBC with Differential - Continue to hold hydroxyurea, will check CBC and decide  Refill - HYDROmorphone (DILAUDID) 4 MG tablet; Take 1 tablet (4 mg total) by mouth every 4 (four) hours as needed for severe pain.  Dispense: 90 tablet; Refill: 0  3. Chronic atrial fibrillation Continue current medications   Patient have been counseled extensively about nutrition and exercise  Return in about 4 weeks (around 11/04/2014) for Sickle Cell Disease/Pain, Routine Follow Up.  The patient was given clear instructions to go to ER or return to medical center if symptoms don't improve, worsen or new problems develop. The patient verbalized understanding. The patient was told to call to get lab results if they  haven't heard anything in the next week.   This note has been created with Surveyor, quantity. Any transcriptional errors are unintentional.    [name], MD, [place], [place], [place], [place] [facility], [place]   10/07/2014, 10:22 AM

## 2014-10-08 DIAGNOSIS — R079 Chest pain, unspecified: Secondary | ICD-10-CM | POA: Insufficient documentation

## 2014-10-10 ENCOUNTER — Other Ambulatory Visit: Payer: Self-pay | Admitting: Family Medicine

## 2014-10-13 ENCOUNTER — Encounter (HOSPITAL_COMMUNITY): Payer: Self-pay

## 2014-10-13 ENCOUNTER — Telehealth (HOSPITAL_COMMUNITY): Payer: Self-pay | Admitting: Internal Medicine

## 2014-10-13 ENCOUNTER — Non-Acute Institutional Stay (HOSPITAL_COMMUNITY)
Admission: AD | Admit: 2014-10-13 | Discharge: 2014-10-13 | Disposition: A | Discharge status: Home / Self Care | Payer: Medicare Other | Attending: Internal Medicine | Admitting: Internal Medicine

## 2014-10-13 ENCOUNTER — Institutional Professional Consult (permissible substitution): Payer: Self-pay | Admitting: Internal Medicine

## 2014-10-13 DIAGNOSIS — I10 Essential (primary) hypertension: Secondary | ICD-10-CM | POA: Diagnosis not present

## 2014-10-13 DIAGNOSIS — Z7901 Long term (current) use of anticoagulants: Secondary | ICD-10-CM | POA: Insufficient documentation

## 2014-10-13 DIAGNOSIS — Z86718 Personal history of other venous thrombosis and embolism: Secondary | ICD-10-CM | POA: Diagnosis not present

## 2014-10-13 DIAGNOSIS — Z79891 Long term (current) use of opiate analgesic: Secondary | ICD-10-CM | POA: Insufficient documentation

## 2014-10-13 DIAGNOSIS — Z7982 Long term (current) use of aspirin: Secondary | ICD-10-CM | POA: Insufficient documentation

## 2014-10-13 DIAGNOSIS — Z96641 Presence of right artificial hip joint: Secondary | ICD-10-CM | POA: Diagnosis not present

## 2014-10-13 DIAGNOSIS — R0781 Pleurodynia: Secondary | ICD-10-CM | POA: Diagnosis present

## 2014-10-13 DIAGNOSIS — D5700 Hb-SS disease with crisis, unspecified: Secondary | ICD-10-CM | POA: Diagnosis present

## 2014-10-13 DIAGNOSIS — Z79899 Other long term (current) drug therapy: Secondary | ICD-10-CM | POA: Diagnosis not present

## 2014-10-13 DIAGNOSIS — Z86711 Personal history of pulmonary embolism: Secondary | ICD-10-CM | POA: Insufficient documentation

## 2014-10-13 DIAGNOSIS — Z87891 Personal history of nicotine dependence: Secondary | ICD-10-CM | POA: Diagnosis not present

## 2014-10-13 LAB — CBC WITH DIFFERENTIAL/PLATELET
BASOS ABS: 0.1 10*3/uL (ref 0.0–0.1)
Basophils Relative: 1 % (ref 0–1)
EOS PCT: 4 % (ref 0–5)
Eosinophils Absolute: 0.5 10*3/uL (ref 0.0–0.7)
HEMATOCRIT: 22.3 % — AB (ref 39.0–52.0)
Hemoglobin: 7.3 g/dL — ABNORMAL LOW (ref 13.0–17.0)
LYMPHS ABS: 1.9 10*3/uL (ref 0.7–4.0)
LYMPHS PCT: 18 % (ref 12–46)
MCH: 32.6 pg (ref 26.0–34.0)
MCHC: 32.7 g/dL (ref 30.0–36.0)
MCV: 99.6 fL (ref 78.0–100.0)
Monocytes Absolute: 1.2 10*3/uL — ABNORMAL HIGH (ref 0.1–1.0)
Monocytes Relative: 11 % (ref 3–12)
NEUTROS ABS: 7.1 10*3/uL (ref 1.7–7.7)
Neutrophils Relative %: 66 % (ref 43–77)
PLATELETS: 328 10*3/uL (ref 150–400)
RBC: 2.24 MIL/uL — AB (ref 4.22–5.81)
RDW: 20.3 % — ABNORMAL HIGH (ref 11.5–15.5)
WBC: 10.7 10*3/uL — AB (ref 4.0–10.5)

## 2014-10-13 LAB — LACTATE DEHYDROGENASE: LDH: 349 U/L — ABNORMAL HIGH (ref 98–192)

## 2014-10-13 MED ORDER — ONDANSETRON HCL 4 MG/2ML IJ SOLN: 4.0000 mg | Freq: Four times a day (QID) | INTRAMUSCULAR | Status: DC | PRN

## 2014-10-13 MED ORDER — SODIUM CHLORIDE 0.9 % IJ SOLN
10.0000 mL | INTRAMUSCULAR | Status: AC | PRN
  Administered 2014-10-13: 10 mL

## 2014-10-13 MED ORDER — HYDROMORPHONE HCL 4 MG PO TABS
4.0000 mg | ORAL_TABLET | Freq: Once | ORAL | Status: AC
  Administered 2014-10-13: 4 mg via ORAL
  Filled 2014-10-13: qty 1

## 2014-10-13 MED ORDER — HYDROMORPHONE 2 MG/ML HIGH CONCENTRATION IV PCA SOLN
INTRAVENOUS | Status: DC
  Administered 2014-10-13: 13.3 mg via INTRAVENOUS
  Administered 2014-10-13: 10:00:00 via INTRAVENOUS
  Administered 2014-10-13: 7 mg via INTRAVENOUS
  Filled 2014-10-13: qty 25

## 2014-10-13 MED ORDER — SODIUM CHLORIDE 0.9 % IV SOLN
12.5000 mg | Freq: Four times a day (QID) | INTRAVENOUS | Status: DC | PRN
  Administered 2014-10-13: 12.5 mg via INTRAVENOUS
  Filled 2014-10-13 (×3): qty 0.25

## 2014-10-13 MED ORDER — SODIUM CHLORIDE 0.9 % IJ SOLN
9.0000 mL | INTRAMUSCULAR | Status: DC | PRN
Start: 2014-10-13 — End: 2014-10-13

## 2014-10-13 MED ORDER — DEXTROSE-NACL 5-0.45 % IV SOLN
INTRAVENOUS | Status: DC
  Administered 2014-10-13: 10:00:00 via INTRAVENOUS

## 2014-10-13 MED ORDER — HEPARIN SOD (PORK) LOCK FLUSH 100 UNIT/ML IV SOLN
500.0000 [IU] | INTRAVENOUS | Status: AC | PRN
  Administered 2014-10-13: 500 [IU]

## 2014-10-13 MED ORDER — KETOROLAC TROMETHAMINE 30 MG/ML IJ SOLN
30.0000 mg | Freq: Four times a day (QID) | INTRAMUSCULAR | Status: DC
  Administered 2014-10-13: 30 mg via INTRAVENOUS
  Filled 2014-10-13: qty 1

## 2014-10-13 MED ORDER — DIPHENHYDRAMINE HCL 12.5 MG/5ML PO ELIX: 12.5000 mg | ORAL_SOLUTION | Freq: Four times a day (QID) | ORAL | Status: DC | PRN

## 2014-10-13 MED ORDER — NALOXONE HCL 0.4 MG/ML IJ SOLN: 0.4000 mg | INTRAMUSCULAR | Status: DC | PRN

## 2014-10-13 NOTE — H&P (Signed)
[facility] History and Physical   Date: 10/13/2014  Patient name: [name] Medical record number: [id number] Date of birth: 21-Oct-1980 Age: 34 y.o. Gender: male PCP: [name], MD  Attending physician: Tresa Garter, MD  Chief Complaint: Rib pain  History of Present Illness: [name], a 34 year old male with a history of sickle cell anemia, HbSS with frequent hospital admissions presents with bilateral rib pain and low back pain. He states that increase pain started on 10/13/2014. Current pain intensity is 8/10 described as constant and aching. He last had Dilaudid 4 mg at 6am with minimal relief. Patient reports that he is taking current medications consistently. He states that he is not utilizing home oxygen. [name] denies headache, shortness of breath, chest pain, fatigue, abdominal pain, nausea, vomiting, or diarrhea.   Meds: Prescriptions prior to admission  Medication Sig Dispense Refill Last Dose  . Cholecalciferol (VITAMIN D) 2000 UNITS tablet Take 1 tablet (2,000 Units total) by mouth daily. 90 tablet 3 10/13/2014 at Unknown time  . diltiazem (CARDIZEM CD) 120 MG 24 hr capsule Take 1 capsule (120 mg total) by mouth daily. 30 capsule 1 10/13/2014 at Unknown time  . enoxaparin (LOVENOX) 150 MG/ML injection Inject 0.73 mLs (110 mg total) into the skin daily. 22 Syringe 5 10/13/2014 at Unknown time  . folic acid (FOLVITE) 1 MG tablet Take 1 tablet (1 mg total) by mouth every morning. 30 tablet 11 10/13/2014 at Unknown time  . HYDROmorphone (DILAUDID) 4 MG tablet Take 1 tablet (4 mg total) by mouth every 4 (four) hours as needed for severe pain. 90 tablet 0 10/13/2014 at Unknown time  . hydroxyurea (HYDREA) 500 MG capsule Take 3 capsules (1,500 mg total) by mouth daily. May take with food to minimize GI side effects. 90 capsule 3 Past Month at Unknown time  . lisinopril (PRINIVIL,ZESTRIL) 10 MG tablet Take 1 tablet (10 mg total) by mouth  daily. 30 tablet 1 10/13/2014 at Unknown time  . metoprolol tartrate (LOPRESSOR) 25 MG tablet Take 1 tablet (25 mg total) by mouth daily. 30 tablet 6 10/13/2014 at Unknown time  . morphine (MS CONTIN) 30 MG 12 hr tablet Take 1 tablet (30 mg total) by mouth every 12 (twelve) hours. 60 tablet 0 10/12/2014 at Unknown time  . potassium chloride SA (K-DUR,KLOR-CON) 20 MEQ tablet Take 1 tablet (20 mEq total) by mouth every morning. 30 tablet 3 10/13/2014 at Unknown time  . zolpidem (AMBIEN) 10 MG tablet Take 1 tablet (10 mg total) by mouth at bedtime as needed for sleep. 30 tablet 0 Past Week at Unknown time  . aspirin 81 MG chewable tablet Chew 1 tablet (81 mg total) by mouth daily. (Patient not taking: Reported on 10/02/2014) 30 tablet 11 Unknown at Unknown time    Allergies: Review of patient's allergies indicates no known allergies. Past Medical History  Diagnosis Date  . Sickle cell anemia   . Blood transfusion   . Acute embolism and thrombosis of right internal jugular vein   . Hypokalemia   . Mood disorder   . History of pulmonary embolus (PE)   . Avascular necrosis   . Leukocytosis     Chronic  . Thrombocytosis     Chronic  . Hypertension   . History of Clostridium difficile infection   . Uses marijuana   . Chronic anticoagulation   . Functional asplenia   . Former smoker   . Second hand tobacco smoke exposure   .  Alcohol consumption of one to four drinks per day   . Noncompliance with medication regimen   . Sickle-cell crisis with associated acute chest syndrome 05/13/2013  . Acute chest syndrome 06/18/2013  . Demand ischemia 01/02/2014   Past Surgical History  Procedure Laterality Date  . Right hip replacement      08/2006  . Cholecystectomy      01/2008  . Porta cath placement    . Porta cath removal    . Umbilical hernia repair      01/2008  . Excision of left periauricular cyst      10/2009  . Excision of right ear lobe cyst with primary closur      11/2007  . Portacath  placement  01/05/2012    Procedure: INSERTION PORT-A-CATH;  Surgeon: Odis Hollingshead, MD;  Location: [facility];  Service: General;  Laterality: N/A;  ultrasound guiced port a cath insertion with fluoroscopy   Family History  Problem Relation Age of Onset  . Sickle cell trait Mother   . Depression Mother   . Diabetes Mother   . Sickle cell trait Father   . Sickle cell trait Brother    Social History   Social History  . Marital Status: Single    Spouse Name: N/A  . Number of Children: 0  . Years of Education: 13   Occupational History  . 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 Smoking status: Former Smoker -- 13 years    Quit date: 07/08/2010  . Smokeless tobacco: Never Used  . Alcohol Use: No  . Drug Use: 2.00 per week    Special: Marijuana  . Sexual Activity:    Partners: Female    Museum/gallery curator: None     Comment: month ago   Other Topics Concern  . Not on file   Social History Narrative   Lives in an apartment.  Single.  Lives alone but has a girlfriend that helps care for him.  Does not use any assist devices.        [name]:  [phone] Mom, emergency contact    Review of Systems: Constitutional: negative for chills, fatigue, fevers, night sweats and weight loss Eyes: negative for glaucoma, redness and visual disturbance Ears, nose, mouth, throat, and face: negative for ear drainage, earaches, facial trauma and hearing loss Respiratory: negative except for cough, dyspnea on exertion and wheezing Cardiovascular: negative except for chest pain, chest pressure/discomfort and lower extremity edema Gastrointestinal: negative Genitourinary:negative for decreased stream, frequency and hematuria Integument/breast: negative Hematologic/lymphatic: negative for bleeding Musculoskeletal:positive for back pain and myalgias Neurological: negative for dizziness, gait problems, headaches, vertigo and  weakness Behavioral/Psych: negative for anxiety, depression and irritability Allergic/Immunologic: negative  Physical Exam: Blood pressure 134/100, pulse 78, temperature 98.5 F (36.9 C), temperature source Oral, resp. rate 20, SpO2 100 %. BP 111/78 mmHg  Pulse 70  Temp(Src) 98.4 F (36.9 C) (Oral)  Resp 19  SpO2 100%  General Appearance:    Alert, cooperative, mild distress, appears older than stated age  Head:    Normocephalic, without obvious abnormality, atraumatic  Eyes:    PERRL, conjunctiva/corneas clear, EOM's intact, fundi    benign, both eyes       Ears:    Normal TM's and external ear canals, both ears  Nose:   Nares normal, septum midline, mucosa normal, no drainage    or sinus tenderness  Neck:  Supple, symmetrical, trachea midline, no adenopathy;       thyroid:  No enlargement/tenderness/nodules; no carotid   bruit or JVD  Back:     Symmetric, no curvature, ROM normal, no CVA tenderness  Lungs:     Clear to auscultation bilaterally, respirations unlabored  Chest wall:    Mild tenderness, no deformity  Heart:    Regular rate and rhythm, S1 and S2 normal  Abdomen:     Soft, non-tender, bowel sounds active all four quadrants,    no masses, no organomegaly  Extremities:   Extremities normal, atraumatic, no cyanosis or edema  Pulses:   2+ and symmetric all extremities  Skin:   Skin color, texture, turgor normal, no rashes or lesions  Lymph nodes:   Cervical, supraclavicular, and axillary nodes normal  Neurologic:   CNII-XII intact. Normal strength, sensation and reflexes      throughout    Lab results: No results found. However, due to the size of the patient record, not all encounters were searched. Please check Results Review for a complete set of results.  Imaging results:  No results found.   Assessment & Plan:  Patient will be admitted to the day infusion center for extended observation  [name] was started on IV D5.45 for cellular rehydration at  125/hr  Start Toradol 30 mg IV every 6 hours for inflammation.  Start Dilaudid PCA High Concentration per weight based protocol.   Patient will be re-evaluated for pain intensity in the context of function and relationship to baseline as care progresses.  If no significant pain relief, will transfer patient to inpatient services for a higher level of care.   Will check LDH and CBC w/differential   Sheree Lalla M 10/13/2014, 9:04 AM

## 2014-10-13 NOTE — Discharge Summary (Signed)
[facility] Discharge Summary   Patient ID: [name] MRN: [id number] DOB/AGE: 35-May-1982 35 y.o.  Admit date: 10/13/2014 Discharge date: 10/13/2014  Primary Care Physician:  [name], MD  Admission Diagnoses:  Active Problems:   Hb-SS disease with crisis   Discharge Medications:    Medication List    ASK your doctor about these medications        aspirin 81 MG chewable tablet  Chew 1 tablet (81 mg total) by mouth daily.     diltiazem 120 MG 24 hr capsule  Commonly known as:  CARDIZEM CD  Take 1 capsule (120 mg total) by mouth daily.     enoxaparin 150 MG/ML injection  Commonly known as:  LOVENOX  Inject 0.73 mLs (110 mg total) into the skin daily.     folic acid 1 MG tablet  Commonly known as:  FOLVITE  Take 1 tablet (1 mg total) by mouth every morning.     HYDROmorphone 4 MG tablet  Commonly known as:  DILAUDID  Take 1 tablet (4 mg total) by mouth every 4 (four) hours as needed for severe pain.     hydroxyurea 500 MG capsule  Commonly known as:  HYDREA  Take 3 capsules (1,500 mg total) by mouth daily. May take with food to minimize GI side effects.     lisinopril 10 MG tablet  Commonly known as:  PRINIVIL,ZESTRIL  Take 1 tablet (10 mg total) by mouth daily.     metoprolol tartrate 25 MG tablet  Commonly known as:  LOPRESSOR  Take 1 tablet (25 mg total) by mouth daily.     morphine 30 MG 12 hr tablet  Commonly known as:  MS CONTIN  Take 1 tablet (30 mg total) by mouth every 12 (twelve) hours.     potassium chloride SA 20 MEQ tablet  Commonly known as:  K-DUR,KLOR-CON  Take 1 tablet (20 mEq total) by mouth every morning.     Vitamin D 2000 UNITS tablet  Take 1 tablet (2,000 Units total) by mouth daily.     zolpidem 10 MG tablet  Commonly known as:  AMBIEN  Take 1 tablet (10 mg total) by mouth at bedtime as needed for sleep.         Consults:  None  Significant Diagnostic Studies:  Nm Pulmonary Perf And  Vent  09/15/2014   CLINICAL DATA:  Hypoxia. Pulmonary embolus on demonstrated on CT of 09/10/2014.  EXAM: NUCLEAR MEDICINE VENTILATION - PERFUSION LUNG SCAN  TECHNIQUE: Ventilation images were obtained in multiple projections using inhaled aerosol Tc-17m DTPA. Perfusion images were obtained in multiple projections after intravenous injection of Tc-12m MAA.  RADIOPHARMACEUTICALS:  Thirty-eight mCi Technetium-52m DTPA aerosol inhalation and 5 mCi Technetium-19m MAA IV  COMPARISON:  CT 09/18/2014  FINDINGS: Ventilation: There is moderate decreased ventilation to the LEFT lower lobe posteriorly.  Perfusion: There is decreased regional perfusion to the LEFT lower lobe posteriorly which matches the ventilation defect but is slightly more pronounced and corresponds with chronic pulmonary emboli seen on comparison CT. There additional small peripheral perfusion defects in the LEFT and RIGHT lungs.  IMPRESSION: 1. No clear evidence of acute pulmonary embolism. 2. Decreased perfusion and ventilation to the LEFT lower lobe with the profusion decreased to a greater degree than the ventilation corresponds to the chronic pulmonary emboli seen on comparison CT. 3. Multiple small peripheral perfusion defects also consistent chronic PE.   Electronically Signed   By: Helane Gunther.D.  On: 09/15/2014 16:24     [facility] Course: [name] was admitted to the day infusion center for extended observation. He given Toradol 30 mg and M3.53 without complication. He was started on a high concentration PCA per weight based protocol. [name] used a total 20.3 mg  of with 32 demands and 29 deliveries. Labs reviewed, hemoglobin 7.3, which is consistent with baseline. Current pain intensity is 4/10, which is decreased from 8/10. Patient was given Dilaudid 4 mg (home pain medication) 30 minutes prior to discharge. Patient alert, oriented, and ambulatory. He is to follow up with Dr. Doreene Burke as scheduled. Patient  discharged in stable condition.   Physical Exam at Discharge:  BP 107/65 mmHg  Pulse 86  Temp(Src) 98.2 F (36.8 C) (Oral)  Resp 19  SpO2 95% Eyes:    PERRL, conjunctiva/corneas clear, EOM's intact, fundi    benign, both eyes       Back:     Symmetric, no curvature, ROM normal, no CVA tenderness  Lungs:     Clear to auscultation bilaterally, respirations unlabored  Chest wall:    No tenderness, no deformity  Heart:    Regular rate and rhythm, S1 and S2 normal  Abdomen:     Soft, non-tender, bowel sounds active all four quadrants,    no masses, no organomegaly  Extremities:   Extremities normal, atraumatic, no cyanosis or edema    Disposition at Discharge: 01-Home or Self Care  Discharge Orders:   Condition at Discharge:   Stable  Time spent on Discharge:  Greater than 30 minutes.  Signed: Valynn Schamberger M 10/13/2014, 3:44 PM

## 2014-10-13 NOTE — Telephone Encounter (Signed)
Pt called and states experiencing pain in side and back; states taken home pain medications for one day with no relief; pt denies shortness of breath, chest pain, abdominal pain, nausea, vomiting, and diarrhea; NP notified; pt informed that he may come to the day hospital for evaluation; pt verbalizes understanding

## 2014-10-13 NOTE — Progress Notes (Signed)
Patient ID: [name], male   DOB: [birth year], 34 y.o.   MRN: [id number] Pt of Dr.Gegede with diagnosis of Sickle cell anemia was evaluated and treated today at [facility] for 8/10 pain in back and rig cage. Fluids, rewarming and PCA started. Port-a-cath accessed.  Pt tolerated procedure well. Pain level is 4 post-procedure. Port flushed per protocol and deaccessed. Pt is ambulatory upon discharge. Discharged to home.  Lucien Mons, [place]

## 2014-10-13 NOTE — Discharge Instructions (Signed)
Sickle Cell Anemia, Adult °Sickle cell anemia is a condition in which red blood cells have an abnormal "sickle" shape. This abnormal shape shortens the cells' life span, which results in a lower than normal concentration of red blood cells in the blood. The sickle shape also causes the cells to clump together and block free blood flow through the blood vessels. As a result, the tissues and organs of the body do not receive enough oxygen. Sickle cell anemia causes organ damage and pain and increases the risk of infection. °CAUSES  °Sickle cell anemia is a genetic disorder. Those who receive two copies of the gene have the condition, and those who receive one copy have the trait. °RISK FACTORS °The sickle cell gene is most common in people whose families originated in [place]. Other areas of the globe where sickle cell trait occurs include the [place], [place] and [place], the [place], and the [place].  °SIGNS AND SYMPTOMS °· Pain, especially in the extremities, back, chest, or abdomen (common). The pain may start suddenly or may develop following an illness, especially if there is dehydration. Pain can also occur due to overexertion or exposure to extreme temperature changes. °· Frequent severe bacterial infections, especially certain types of pneumonia and meningitis. °· Pain and swelling in the hands and feet. °· Decreased activity.   °· Loss of appetite.   °· Change in behavior. °· Headaches. °· Seizures. °· Shortness of breath or difficulty breathing. °· Vision changes. °· Skin ulcers. °Those with the trait may not have symptoms or they may have mild symptoms.  °DIAGNOSIS  °Sickle cell anemia is diagnosed with blood tests that demonstrate the genetic trait. It is often diagnosed during the newborn period, due to mandatory testing nationwide. A variety of blood tests, X-rays, CT scans, MRI scans, ultrasounds, and lung function tests may also be done to monitor the condition. °TREATMENT  °Sickle  cell anemia may be treated with: °· Medicines. You may be given pain medicines, antibiotic medicines (to treat and prevent infections) or medicines to increase the production of certain types of hemoglobin. °· Fluids. °· Oxygen. °· Blood transfusions. °HOME CARE INSTRUCTIONS  °· Drink enough fluid to keep your urine clear or pale yellow. Increase your fluid intake in hot weather and during exercise. °· Do not smoke. Smoking lowers oxygen levels in the blood.   °· Only take over-the-counter or prescription medicines for pain, fever, or discomfort as directed by your health care provider. °· Take antibiotics as directed by your health care provider. Make sure you finish them it even if you start to feel better.   °· Take supplements as directed by your health care provider.   °· Consider wearing a medical alert bracelet. This tells anyone caring for you in an emergency of your condition.   °· When traveling, keep your medical information, health care provider's names, and the medicines you take with you at all times.   °· If you develop a fever, do not take medicines to reduce the fever right away. This could cover up a problem that is developing. Notify your health care provider. °· Keep all follow-up appointments with your health care provider. Sickle cell anemia requires regular medical care. °SEEK MEDICAL CARE IF: ° You have a fever. °SEEK IMMEDIATE MEDICAL CARE IF:  °· You feel dizzy or faint.   °· You have new abdominal pain, especially on the left side near the stomach area.   °· You develop a persistent, often uncomfortable and painful penile erection (priapism). If this is not treated immediately it   will lead to impotence.   °· You have numbness your arms or legs or you have a hard time moving them.   °· You have a hard time with speech.   °· You have a fever or persistent symptoms for more than 2-3 days.   °· You have a fever and your symptoms suddenly get worse.   °· You have signs or symptoms of infection.  These include:   °¨ Chills.   °¨ Abnormal tiredness (lethargy).   °¨ Irritability.   °¨ Poor eating.   °¨ Vomiting.   °· You develop pain that is not helped with medicine.   °· You develop shortness of breath. °· You have pain in your chest.   °· You are coughing up pus-like or bloody sputum.   °· You develop a stiff neck. °· Your feet or hands swell or have pain. °· Your abdomen appears bloated. °· You develop joint pain. °MAKE SURE YOU: °· Understand these instructions. °Document Released: 04/27/2005 Document Revised: 06/03/2013 Document Reviewed: 08/29/2012 °[employer]® Patient Information ©2015 [employer]. This information is not intended to replace advice given to you by your health care provider. Make sure you discuss any questions you have with your health care provider. ° °

## 2014-10-14 IMAGING — CT CT ANGIO CHEST
2 of 6 series · 19 of 36 positions shown · IV contrast (omnipaque)
Comparison: Chest CTA 04/25/2011.

CLINICAL DATA: Pleuritic chest pain.  Shortness of breath.  History
of sickle cell.

CT ANGIOGRAPHY CHEST
TECHNIQUE: Multidetector CT imaging of the chest using the
standard protocol during bolus administration of intravenous
contrast. Multiplanar reconstructed images including MIPs were
obtained and reviewed to evaluate the vascular anatomy.
Contrast: 80mL OMNIPAQUE IOHEXOL 350 MG/ML SOLN

[Series 6: pe thins @ 1mm · axial · 0.74mm/px · z∈[-245,-4]mm · 18 of 269 slices shown]
[im 14/269  lung]
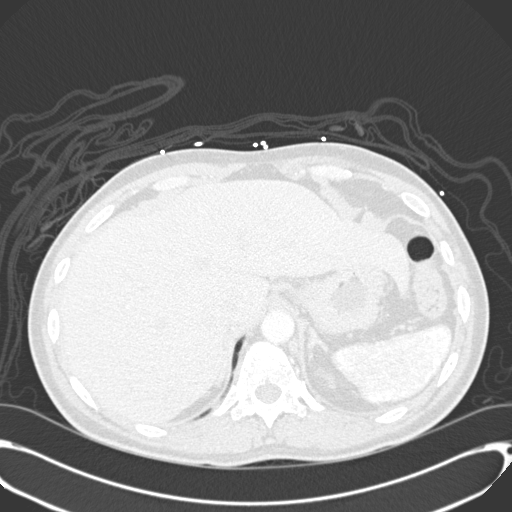
[im 27/269  mediastinal]
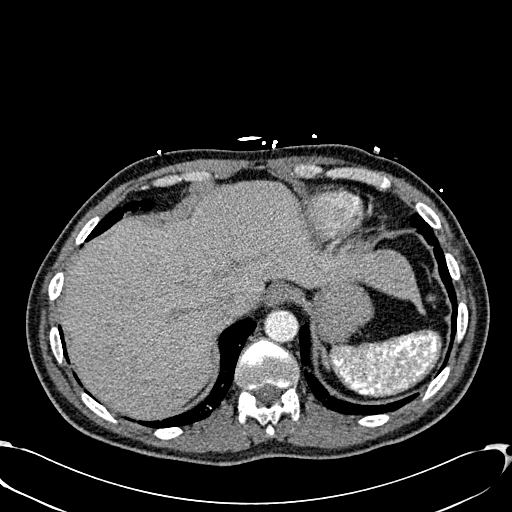
[im 41/269  lung]
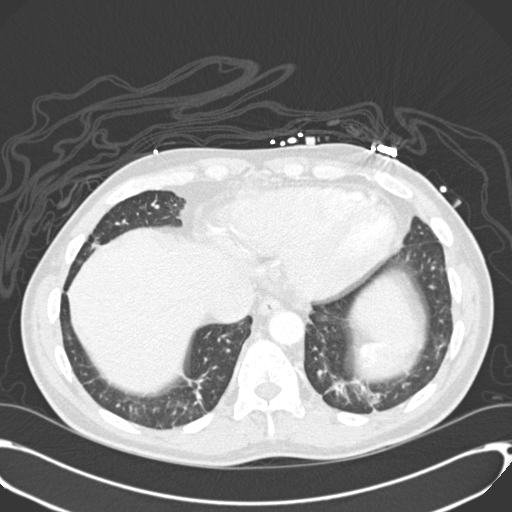
[im 54/269  mediastinal]
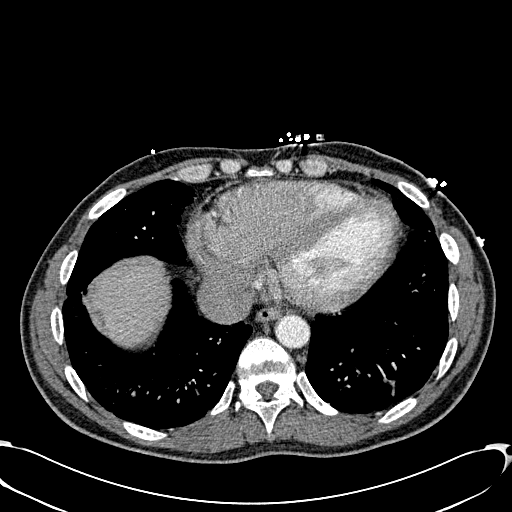
[im 68/269  lung]
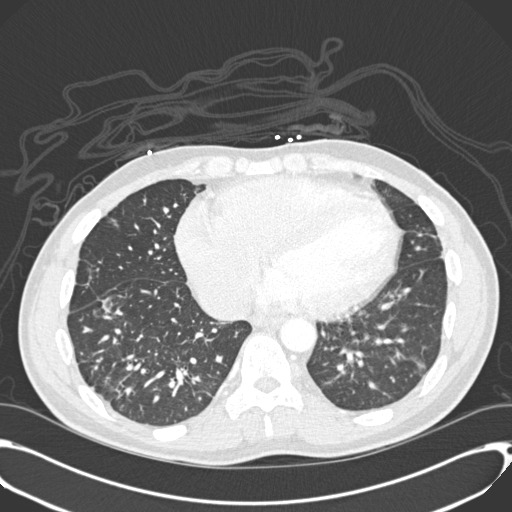
[im 81/269  mediastinal]
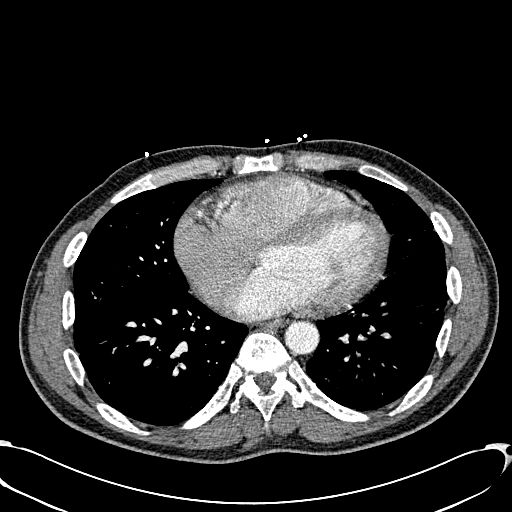
[im 94/269  lung]
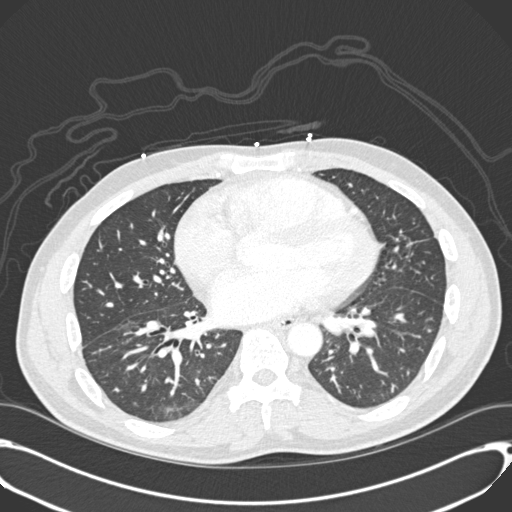
[im 108/269  mediastinal]
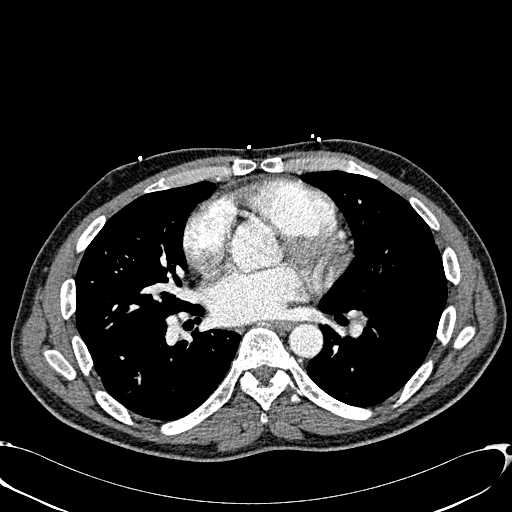
[im 121/269  lung]
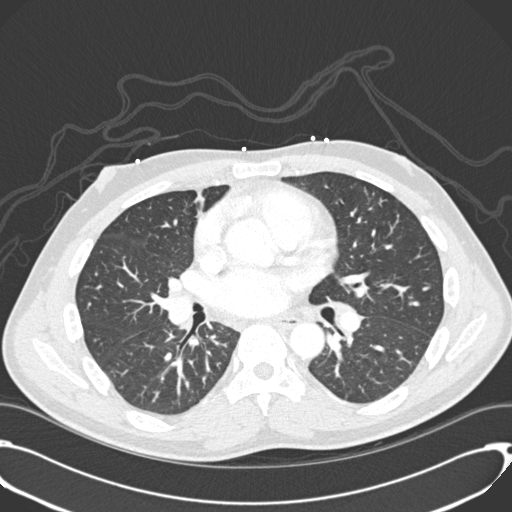
[im 148/269  mediastinal]
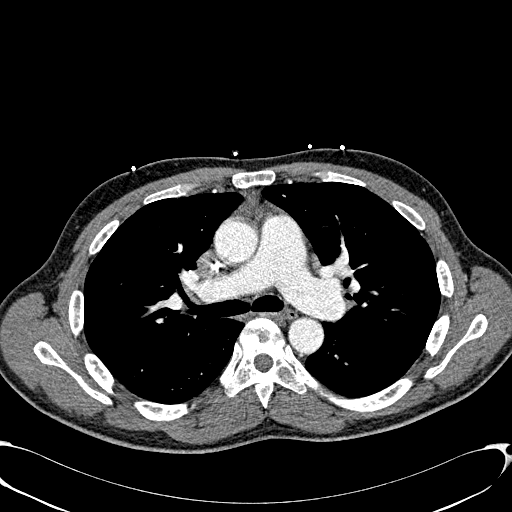
[im 161/269  lung]
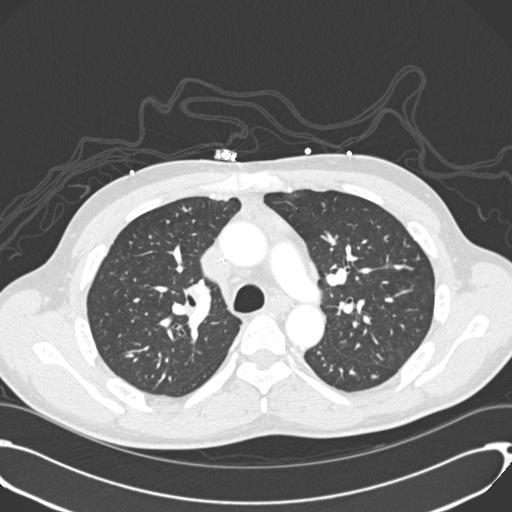
[im 175/269  mediastinal]
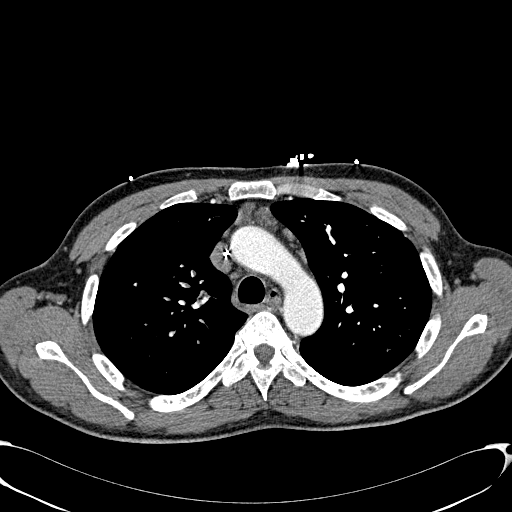
[im 188/269  lung]
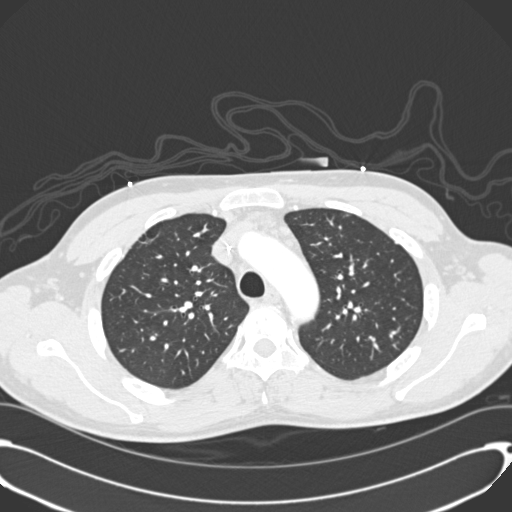
[im 202/269  mediastinal]
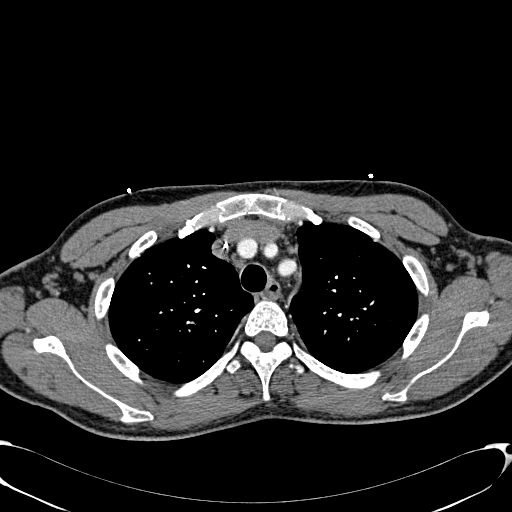
[im 215/269  lung]
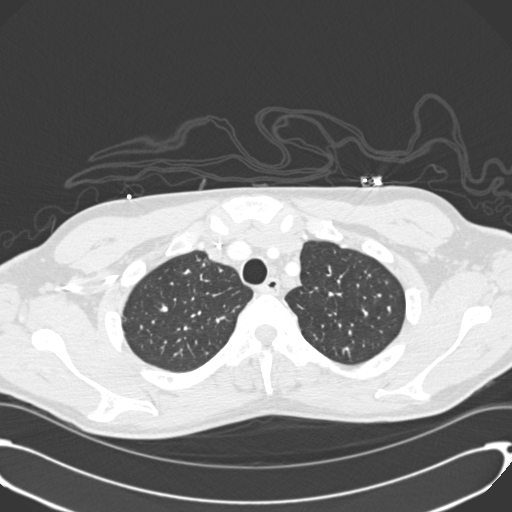
[im 228/269  mediastinal]
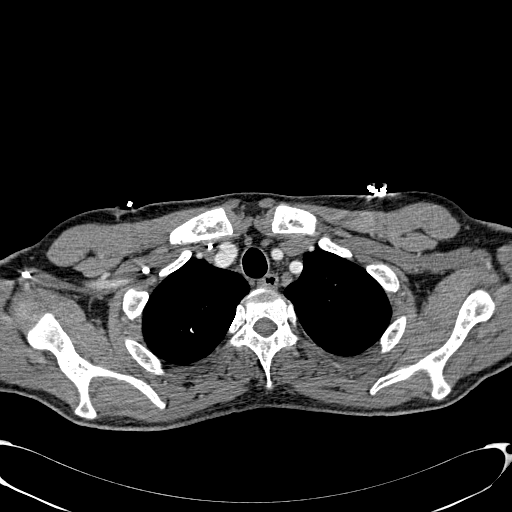
[im 242/269  lung]
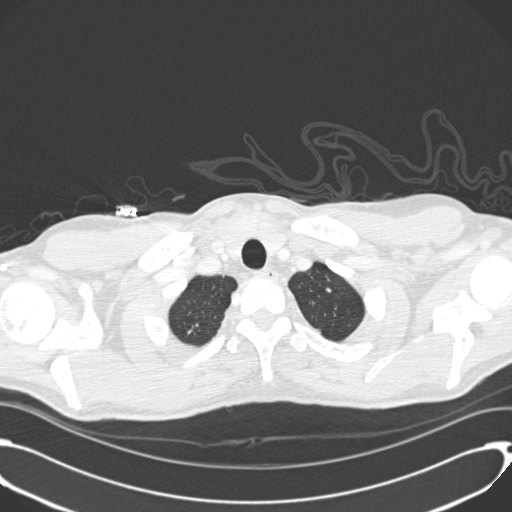
[im 255/269  mediastinal]
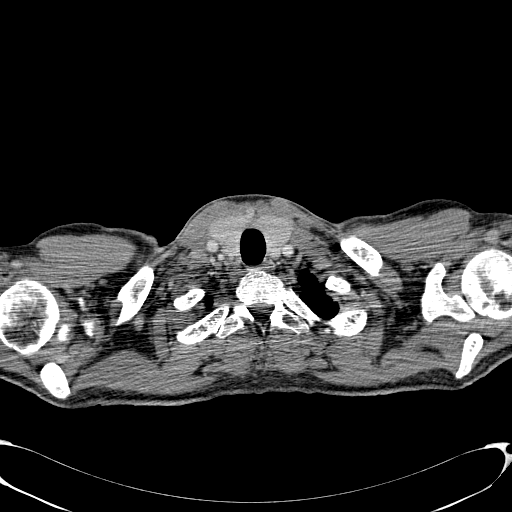

[Series 602: <mpr thick range> · coronal · 0.74mm/px · 1 of 116 slices shown]
[im 58/116  mediastinal]
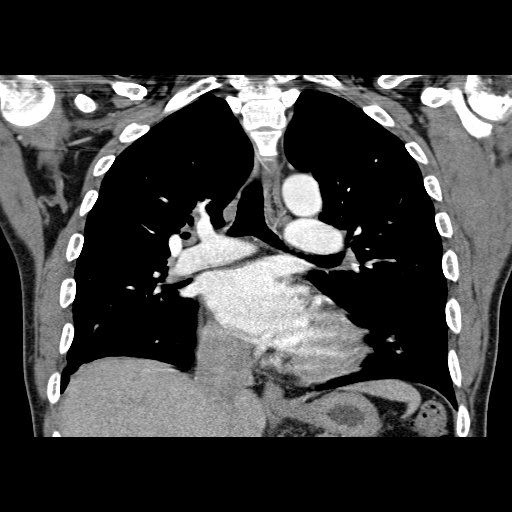

[19 of 36 positions shown; findings below may reference images not displayed]

FINDINGS: The pulmonary arteries are adequately opacified with
contrast.  There is no evidence of acute pulmonary embolism.  Mild
central enlargement of the pulmonary arteries is stable.

There are stable mildly prominent hilar and prevascular lymph nodes
bilaterally.  There is no pleural or pericardial effusion.  Right
arm PICC remains in place.

Scattered patchy opacities in both lungs are stable and most
consistent with atelectasis or scarring.  There is no endobronchial
lesion or confluent airspace opacity.

The visualized upper abdomen has a stable appearance with
autoinfarction of the spleen.  Chronic endplate deformities
throughout the thoracic spine are stable.
IMPRESSION: 1.  No evidence of acute pulmonary embolism.
2.  Stable scattered pulmonary parenchymal scarring or atelectasis.
3.  Stable sequela of sickle cell disease with autoinfarction the
spleen and chronic endplate deformities throughout the thoracic
spine.

## 2014-10-14 IMAGING — XA IR FLUORO GUIDE CV LINE*R*
1 series · 2 of 2 positions shown · non-contrast
Comparison: none

CLINICAL DATA: Chest pain, sickle cell anemia and lack of IV
access.

[Series 300: line placements · 2 of 2 slices shown]
[im 1/2]
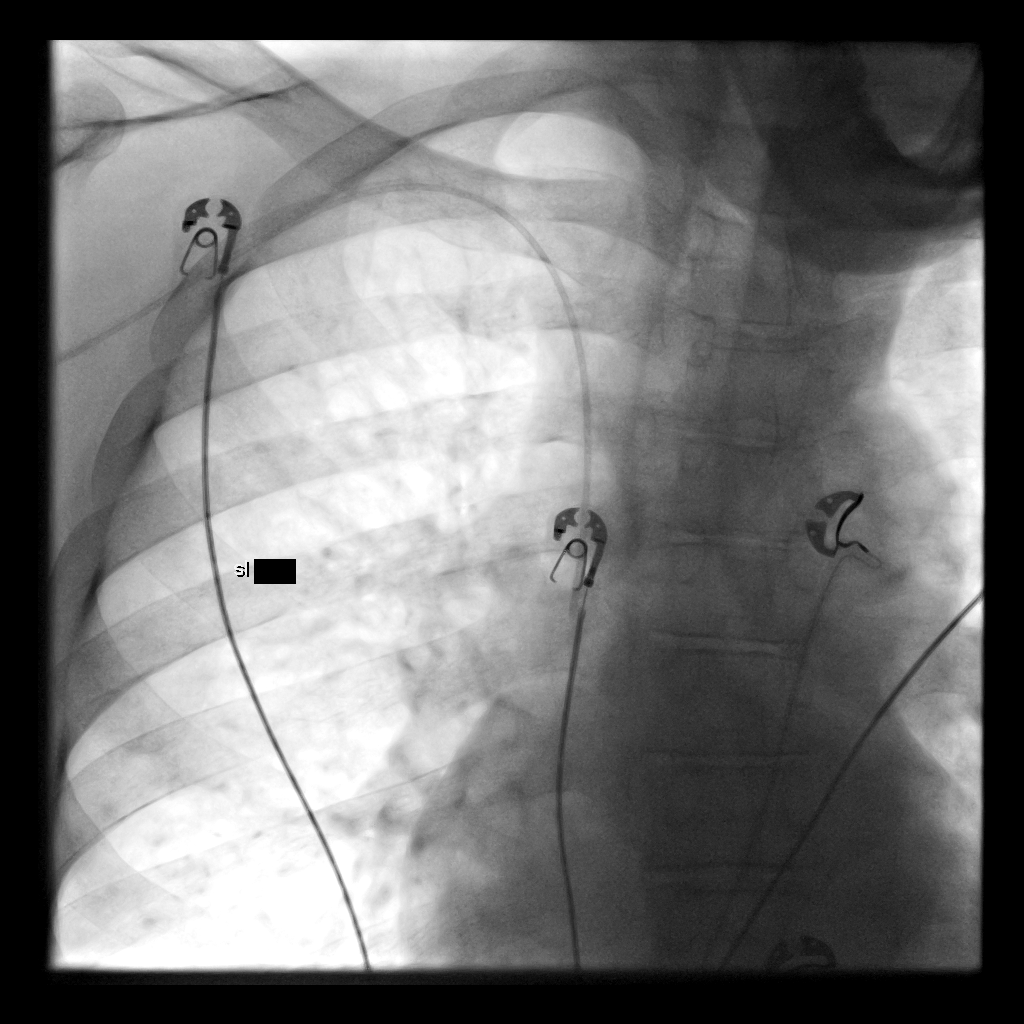
[im 2/2]
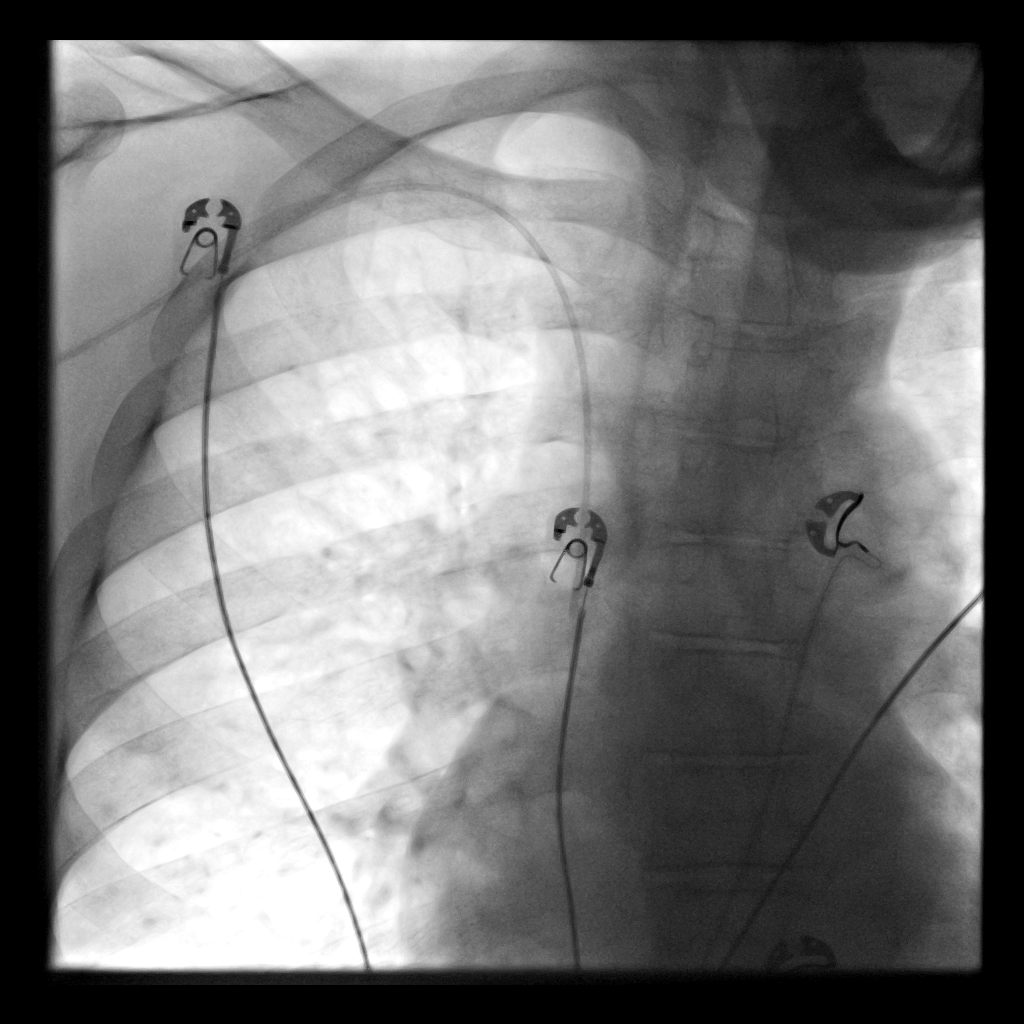

[2 of 2 positions shown; findings below may reference images not displayed]

PICC LINE PLACEMENT WITH ULTRASOUND AND FLUOROSCOPIC GUIDANCE

Fluoroscopy Time: 0.2 minutes.

The right arm was prepped with chlorhexidine, draped in the usual
sterile fashion using maximum barrier technique (cap and mask,
sterile gown, sterile gloves, large sterile sheet, hand hygiene and
cutaneous antisepsis) and infiltrated locally with 1% Lidocaine.

Ultrasound demonstrated patency of the right brachial vein, and
this was documented with an image.  Under real-time ultrasound
guidance, this vein was accessed with a 21 gauge micropuncture
needle and image documentation was performed.  The needle was
exchanged over a guidewire for a peel-away sheath through which a 5
French single lumen PICC trimmed to 42 cm was advanced, positioned
with its tip at the lower SVC/right atrial junction.  Fluoroscopy
during the procedure and fluoro spot radiograph confirms
appropriate catheter position.  The catheter was flushed, secured
to the skin with Prolene sutures, and covered with a sterile
dressing.

Complications:  None
IMPRESSION: Successful right arm PICC line placement with ultrasound and
fluoroscopic guidance.  The catheter is ready for use. A power
injectable PICC line was placed.

## 2014-10-14 IMAGING — CR DG CHEST 2V
2 series · 2 of 2 positions shown · non-contrast
Comparison: 11/03/2011 and 10/02/2011.

CLINICAL DATA: Shortness of breath.  History of sickle cell
disease.

CHEST - 2 VIEW

[w chest pa]
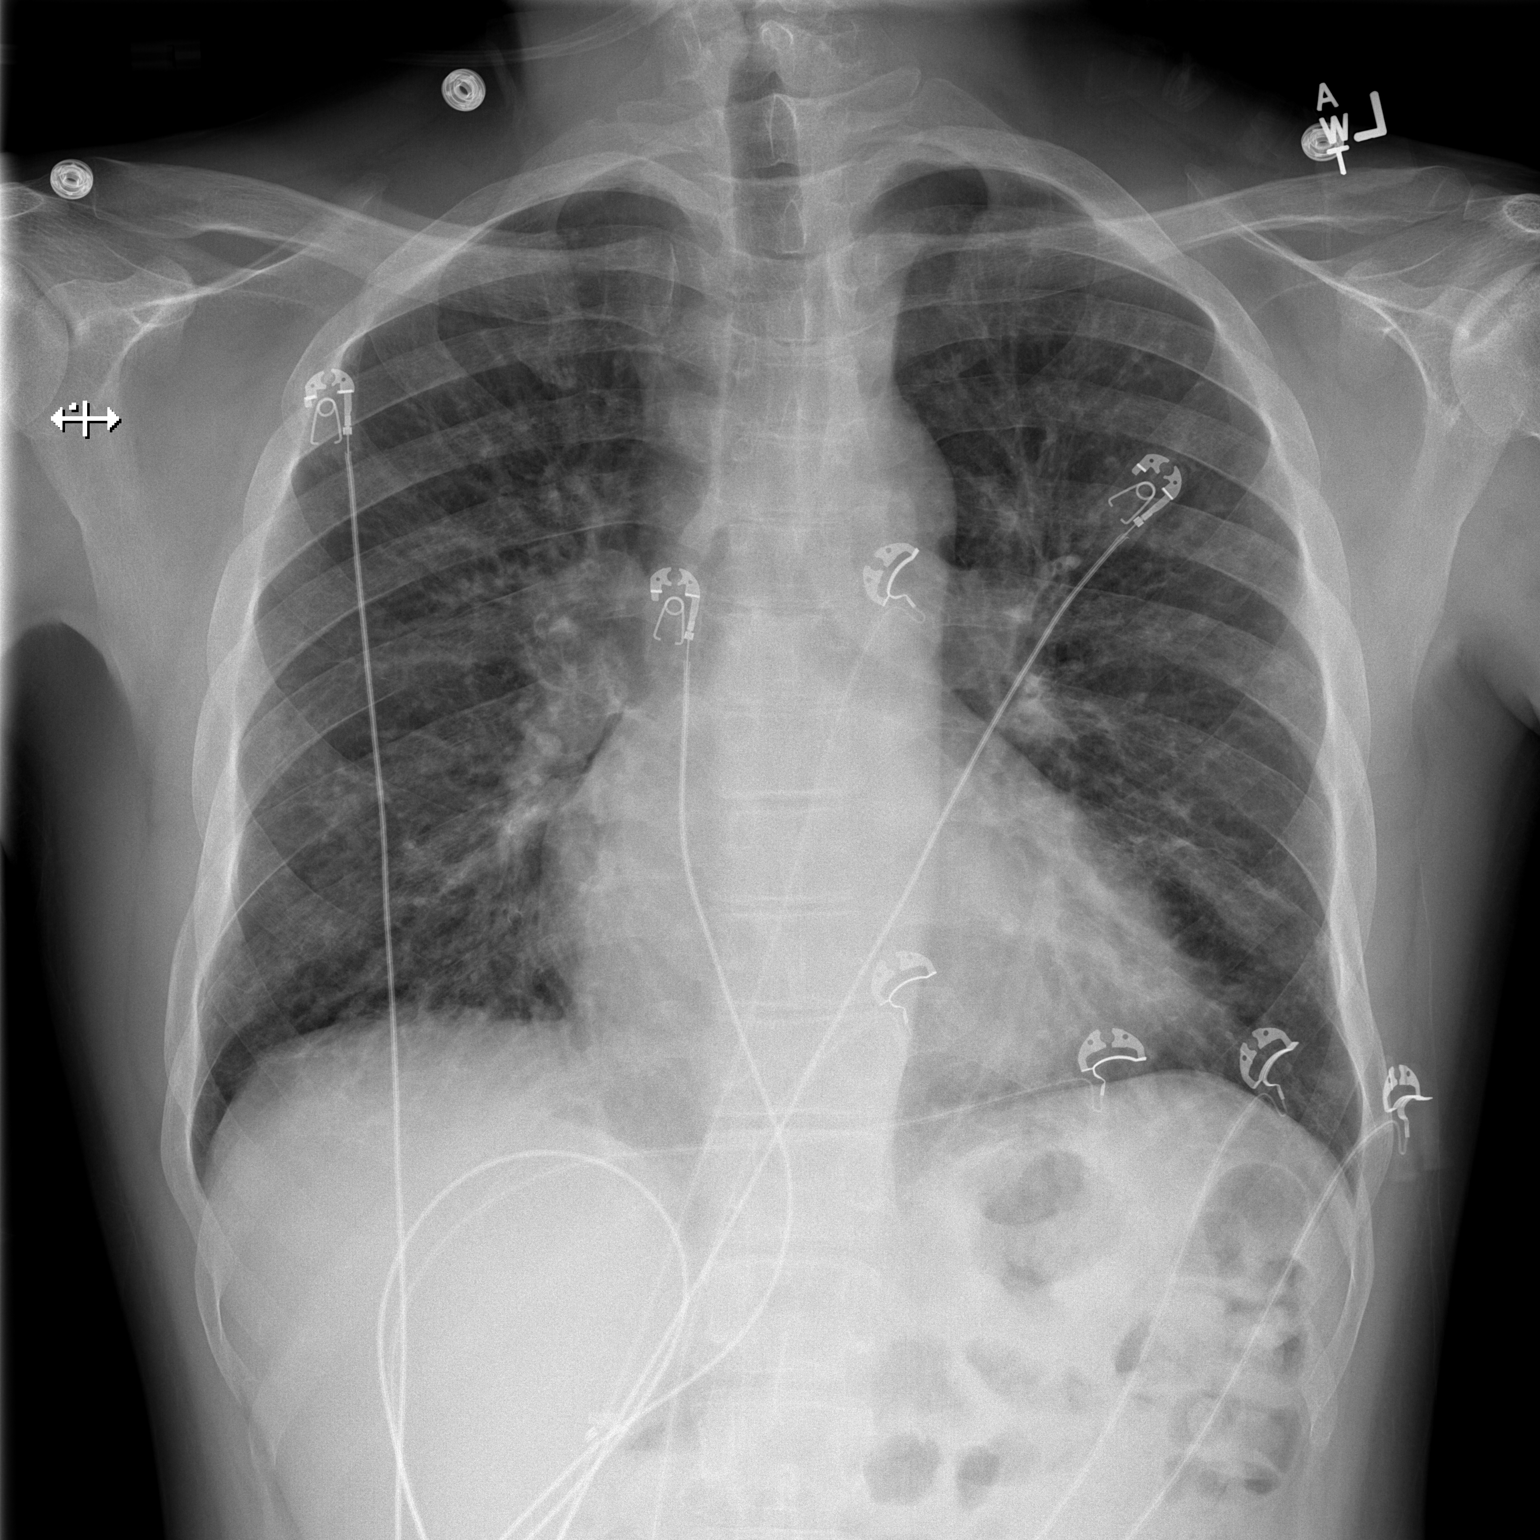

[w chest lat]
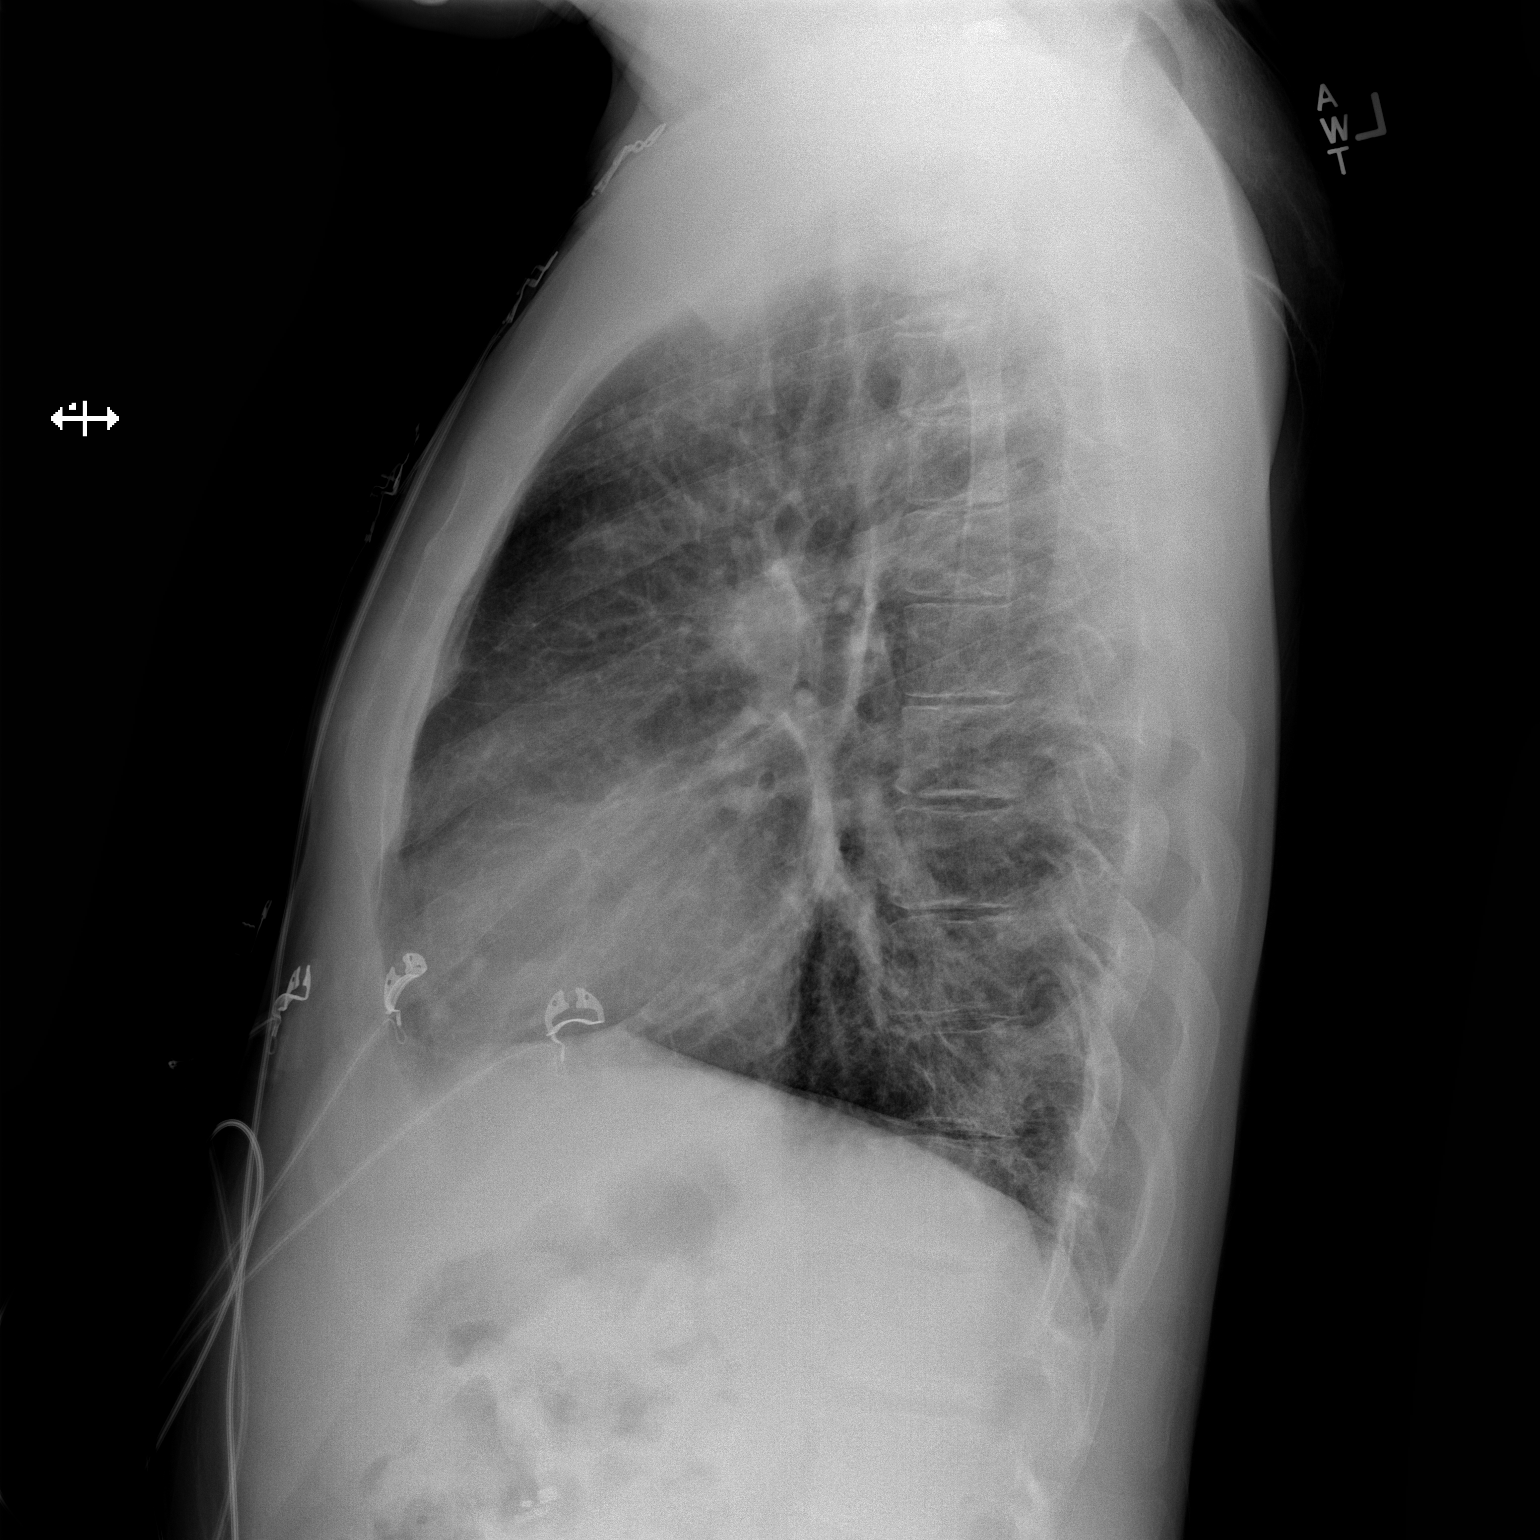

[2 of 2 positions shown; findings below may reference images not displayed]

FINDINGS: There is stable mild cardiac enlargement.  Mediastinal
contours are stable.  There is stable chronic central airway
thickening without evidence of superimposed airspace disease, edema
or pleural effusion.  The osseous structures appear unchanged.
Telemetry leads overlie the chest.
IMPRESSION: Stable mild cardiomegaly and chronic lung disease.  No acute
cardiopulmonary process.

## 2014-10-16 ENCOUNTER — Encounter (HOSPITAL_COMMUNITY): Payer: Self-pay | Admitting: Emergency Medicine

## 2014-10-16 ENCOUNTER — Non-Acute Institutional Stay (HOSPITAL_COMMUNITY)
Admission: EM | Admit: 2014-10-16 | Discharge: 2014-10-16 | Disposition: A | Discharge status: Home / Self Care | Payer: Medicare Other | Attending: Emergency Medicine | Admitting: Emergency Medicine

## 2014-10-16 DIAGNOSIS — Z96641 Presence of right artificial hip joint: Secondary | ICD-10-CM | POA: Diagnosis not present

## 2014-10-16 DIAGNOSIS — M791 Myalgia: Secondary | ICD-10-CM | POA: Diagnosis not present

## 2014-10-16 DIAGNOSIS — D5700 Hb-SS disease with crisis, unspecified: Secondary | ICD-10-CM | POA: Diagnosis not present

## 2014-10-16 DIAGNOSIS — Z87891 Personal history of nicotine dependence: Secondary | ICD-10-CM | POA: Diagnosis not present

## 2014-10-16 DIAGNOSIS — Z7722 Contact with and (suspected) exposure to environmental tobacco smoke (acute) (chronic): Secondary | ICD-10-CM | POA: Insufficient documentation

## 2014-10-16 DIAGNOSIS — I10 Essential (primary) hypertension: Secondary | ICD-10-CM | POA: Diagnosis not present

## 2014-10-16 DIAGNOSIS — Z79891 Long term (current) use of opiate analgesic: Secondary | ICD-10-CM | POA: Diagnosis not present

## 2014-10-16 DIAGNOSIS — Z86718 Personal history of other venous thrombosis and embolism: Secondary | ICD-10-CM | POA: Diagnosis not present

## 2014-10-16 DIAGNOSIS — Z7901 Long term (current) use of anticoagulants: Secondary | ICD-10-CM | POA: Diagnosis not present

## 2014-10-16 DIAGNOSIS — Z79899 Other long term (current) drug therapy: Secondary | ICD-10-CM | POA: Insufficient documentation

## 2014-10-16 DIAGNOSIS — Z7982 Long term (current) use of aspirin: Secondary | ICD-10-CM | POA: Diagnosis not present

## 2014-10-16 DIAGNOSIS — R0781 Pleurodynia: Secondary | ICD-10-CM | POA: Diagnosis present

## 2014-10-16 LAB — COMPREHENSIVE METABOLIC PANEL
ALBUMIN: 4.1 g/dL (ref 3.5–5.0)
ALT: 24 U/L (ref 17–63)
AST: 33 U/L (ref 15–41)
Alkaline Phosphatase: 77 U/L (ref 38–126)
Anion gap: 5 (ref 5–15)
BUN: 10 mg/dL (ref 6–20)
CHLORIDE: 110 mmol/L (ref 101–111)
CO2: 25 mmol/L (ref 22–32)
CREATININE: 0.69 mg/dL (ref 0.61–1.24)
Calcium: 9 mg/dL (ref 8.9–10.3)
GFR calc Af Amer: >60 mL/min (ref >60)
GFR calc non Af Amer: >60 mL/min (ref >60)
GLUCOSE: 117 mg/dL — AB (ref 65–99)
Potassium: 3.9 mmol/L (ref 3.5–5.1)
SODIUM: 140 mmol/L (ref 135–145)
Total Bilirubin: 4.1 mg/dL — ABNORMAL HIGH (ref 0.3–1.2)
Total Protein: 7.4 g/dL (ref 6.5–8.1)

## 2014-10-16 LAB — CBC WITH DIFFERENTIAL/PLATELET
Basophils Absolute: 0.1 10*3/uL (ref 0.0–0.1)
Basophils Relative: 1 %
EOS ABS: 0.5 10*3/uL (ref 0.0–0.7)
EOS PCT: 4 %
HCT: 19.8 % — ABNORMAL LOW (ref 39.0–52.0)
Hemoglobin: 6.7 g/dL — CL (ref 13.0–17.0)
LYMPHS ABS: 2.7 10*3/uL (ref 0.7–4.0)
Lymphocytes Relative: 24 %
MCH: 33 pg (ref 26.0–34.0)
MCHC: 33.8 g/dL (ref 30.0–36.0)
MCV: 97.5 fL (ref 78.0–100.0)
MONOS PCT: 11 %
Monocytes Absolute: 1.2 10*3/uL — ABNORMAL HIGH (ref 0.1–1.0)
Neutro Abs: 7.1 10*3/uL (ref 1.7–7.7)
Neutrophils Relative %: 61 %
PLATELETS: 341 10*3/uL (ref 150–400)
RBC: 2.03 MIL/uL — ABNORMAL LOW (ref 4.22–5.81)
RDW: 20 % — ABNORMAL HIGH (ref 11.5–15.5)
WBC: 11.6 10*3/uL — ABNORMAL HIGH (ref 4.0–10.5)

## 2014-10-16 LAB — RETICULOCYTES
RBC.: 2.03 MIL/uL — ABNORMAL LOW (ref 4.22–5.81)
RETIC CT PCT: 10.3 % — AB (ref 0.4–3.1)
Retic Count, Absolute: 209.1 10*3/uL — ABNORMAL HIGH (ref 19.0–186.0)

## 2014-10-16 MED ORDER — DIPHENHYDRAMINE HCL 12.5 MG/5ML PO ELIX: 12.5000 mg | ORAL_SOLUTION | Freq: Four times a day (QID) | ORAL | Status: DC | PRN

## 2014-10-16 MED ORDER — NALOXONE HCL 0.4 MG/ML IJ SOLN: 0.4000 mg | INTRAMUSCULAR | Status: DC | PRN

## 2014-10-16 MED ORDER — HYDROMORPHONE HCL 1 MG/ML IJ SOLN
1.0000 mg | Freq: Once | INTRAMUSCULAR | Status: AC
  Administered 2014-10-16: 1 mg via INTRAVENOUS
  Filled 2014-10-16: qty 1

## 2014-10-16 MED ORDER — HYDROMORPHONE HCL 4 MG PO TABS
4.0000 mg | ORAL_TABLET | Freq: Once | ORAL | Status: AC
  Administered 2014-10-16: 4 mg via ORAL
  Filled 2014-10-16: qty 1

## 2014-10-16 MED ORDER — DIPHENHYDRAMINE HCL 50 MG/ML IJ SOLN
25.0000 mg | Freq: Once | INTRAMUSCULAR | Status: AC
  Administered 2014-10-16: 25 mg via INTRAVENOUS
  Filled 2014-10-16: qty 1

## 2014-10-16 MED ORDER — HYDROMORPHONE HCL 1 MG/ML IJ SOLN
1.0000 mg | Freq: Once | INTRAMUSCULAR | Status: AC
Start: 2014-10-16 — End: 2014-10-16
  Administered 2014-10-16: 1 mg via INTRAVENOUS
  Filled 2014-10-16: qty 1

## 2014-10-16 MED ORDER — SODIUM CHLORIDE 0.9 % IV BOLUS (SEPSIS)
1000.0000 mL | Freq: Once | INTRAVENOUS | Status: AC
  Administered 2014-10-16: 1000 mL via INTRAVENOUS

## 2014-10-16 MED ORDER — DIPHENHYDRAMINE HCL 25 MG PO CAPS
25.0000 mg | ORAL_CAPSULE | Freq: Once | ORAL | Status: AC
  Administered 2014-10-16: 25 mg via ORAL
  Filled 2014-10-16: qty 1

## 2014-10-16 MED ORDER — ONDANSETRON HCL 4 MG/2ML IJ SOLN: 4.0000 mg | Freq: Four times a day (QID) | INTRAMUSCULAR | Status: DC | PRN

## 2014-10-16 MED ORDER — DEXTROSE-NACL 5-0.45 % IV SOLN
INTRAVENOUS | Status: DC
  Administered 2014-10-16: 11:00:00 via INTRAVENOUS

## 2014-10-16 MED ORDER — SODIUM CHLORIDE 0.9 % IJ SOLN: 9.0000 mL | INTRAMUSCULAR | Status: DC | PRN

## 2014-10-16 MED ORDER — SODIUM CHLORIDE 0.9 % IJ SOLN
10.0000 mL | INTRAMUSCULAR | Status: AC | PRN
  Administered 2014-10-16: 10 mL

## 2014-10-16 MED ORDER — KETOROLAC TROMETHAMINE 30 MG/ML IJ SOLN
30.0000 mg | Freq: Once | INTRAMUSCULAR | Status: AC
  Administered 2014-10-16: 30 mg via INTRAVENOUS
  Filled 2014-10-16: qty 1

## 2014-10-16 MED ORDER — HYDROMORPHONE 2 MG/ML HIGH CONCENTRATION IV PCA SOLN
INTRAVENOUS | Status: DC
  Administered 2014-10-16: 28.1 mg via INTRAVENOUS
  Administered 2014-10-16: 11:00:00 via INTRAVENOUS
  Filled 2014-10-16: qty 25

## 2014-10-16 MED ORDER — SODIUM CHLORIDE 0.9 % IV SOLN
Freq: Once | INTRAVENOUS | Status: AC
  Administered 2014-10-16: 07:00:00 via INTRAVENOUS

## 2014-10-16 MED ORDER — HEPARIN SOD (PORK) LOCK FLUSH 100 UNIT/ML IV SOLN
500.0000 [IU] | INTRAVENOUS | Status: AC | PRN
  Administered 2014-10-16: 500 [IU]
  Filled 2014-10-16: qty 5

## 2014-10-16 MED ORDER — DIPHENHYDRAMINE HCL 50 MG/ML IJ SOLN
12.5000 mg | Freq: Four times a day (QID) | INTRAMUSCULAR | Status: DC | PRN
  Filled 2014-10-16: qty 0.25

## 2014-10-16 NOTE — ED Notes (Signed)
MD at bedside. 

## 2014-10-16 NOTE — ED Notes (Signed)
Patient ambulatory to treatment room

## 2014-10-16 NOTE — ED Notes (Addendum)
Critcal Hgb 6.7 MD notified

## 2014-10-16 NOTE — Discharge Summary (Signed)
[facility] Discharge Summary   Patient ID: [name] MRN: [id number] DOB/AGE: [birth year] 34 y.o.  Admit date: 10/16/2014 Discharge date: 10/16/2014  Primary Care Physician:  [name], MD  Admission Diagnoses:  Active Problems:   Sickle cell anemia with pain   Discharge Medications:    Medication List    STOP taking these medications        hydroxyurea 500 MG capsule  Commonly known as:  HYDREA      TAKE these medications        aspirin 81 MG chewable tablet  Chew 1 tablet (81 mg total) by mouth daily.     diltiazem 120 MG 24 hr capsule  Commonly known as:  CARDIZEM CD  Take 1 capsule (120 mg total) by mouth daily.     enoxaparin 150 MG/ML injection  Commonly known as:  LOVENOX  Inject 0.73 mLs (110 mg total) into the skin daily.     folic acid 1 MG tablet  Commonly known as:  FOLVITE  Take 1 tablet (1 mg total) by mouth every morning.     HYDROmorphone 4 MG tablet  Commonly known as:  DILAUDID  Take 1 tablet (4 mg total) by mouth every 4 (four) hours as needed for severe pain.     lisinopril 10 MG tablet  Commonly known as:  PRINIVIL,ZESTRIL  Take 1 tablet (10 mg total) by mouth daily.     metoprolol tartrate 25 MG tablet  Commonly known as:  LOPRESSOR  Take 1 tablet (25 mg total) by mouth daily.     morphine 30 MG 12 hr tablet  Commonly known as:  MS CONTIN  Take 1 tablet (30 mg total) by mouth every 12 (twelve) hours.     potassium chloride SA 20 MEQ tablet  Commonly known as:  K-DUR,KLOR-CON  Take 1 tablet (20 mEq total) by mouth every morning.     Vitamin D 2000 UNITS tablet  Take 1 tablet (2,000 Units total) by mouth daily.     zolpidem 10 MG tablet  Commonly known as:  AMBIEN  Take 1 tablet (10 mg total) by mouth at bedtime as needed for sleep.         Consults:  None  Significant Diagnostic Studies:  No results found.   [facility] Course: -Mr. Huxtable was admitted to the day  infusion center for extended observation.  -Was administered Toradol 30 mg for inflammation -Started D5.45 for cellular re-hydration -Reviewed laboratory values, consistent with baseline -High Concentration PCA per weight based protocol for pain control. Patient used a total of 16.1 mg with 28 demands and 23 deliveries. He states that pain intensity is 4-5/10. Patient was given Dilaudid 4 mg (home pain medication) 30 minutes prior to discharge. He maintains that he can function at home on current medication regimen. Patient is alert, oriented and ambulatory.   -Mr. Zeitlin was advised of follow-up appointment with Dr. Doreene Burke. -Patient discharged home in stable condition.   BP 118/79 mmHg  Pulse 93  Temp(Src) 98.7 F (37.1 C) (Oral)  Resp 24  Ht 6' (1.829 m)  Wt 162 lb (73.483 kg)  BMI 21.97 kg/m2  SpO2 94%  PF   General Appearance:   Alert, cooperative, no distress, appears older than stated age  Head:   Normocephalic, without obvious abnormality, atraumatic  Eyes:   PERRL, conjunctiva/corneas clear, EOM's intact, fundi   benign, both eyes   Ears:   Normal TM's and external ear canals,  both ears  Nose:  Nares normal, septum midline, mucosa normal, no drainage  or sinus tenderness  Throat:  Lips, mucosa, and tongue normal; teeth and gums normal  Neck:  Supple, symmetrical, trachea midline, no adenopathy;   thyroid: No enlargement/tenderness/nodules; no carotid  bruit or JVD  Back:   Symmetric, no curvature, ROM normal, no CVA tenderness  Lungs:   Clear to auscultation bilaterally, respirations unlabored  Chest wall:   No tenderness or deformity  Heart:   Regular rate and rhythm, S1 and S2 normal, no murmur, rub  or gallop  Abdomen:   Soft, non-tender, bowel sounds active all four quadrants,   no masses, no organomegaly  Extremities:  Extremities normal, atraumatic, no cyanosis or edema  Pulses:  2+ and symmetric all  extremities  Skin:  Skin color, texture, turgor normal, no rashes or lesions  Lymph nodes:  Cervical, supraclavicular, and axillary nodes normal  Neurologic:  CNII-XII intact. Normal strength, sensation and reflexes   throughout          Disposition at Discharge: 01-Home or Self Care  Discharge Orders:     Discharge Instructions    Discharge patient    Complete by:  As directed            Condition at Discharge:   Stable  Time spent on Discharge:  20 minutes.  Signed: Karagan Lehr M 10/16/2014, 4:25 PM

## 2014-10-16 NOTE — ED Provider Notes (Signed)
10:28 AM  I spoke with NP Hollister who will take patient to the sickle cell clinic.    Serita Grit, MD 10/16/14 [phone]

## 2014-10-16 NOTE — Discharge Instructions (Signed)
Sickle Cell Anemia, Adult °Sickle cell anemia is a condition in which red blood cells have an abnormal "sickle" shape. This abnormal shape shortens the cells' life span, which results in a lower than normal concentration of red blood cells in the blood. The sickle shape also causes the cells to clump together and block free blood flow through the blood vessels. As a result, the tissues and organs of the body do not receive enough oxygen. Sickle cell anemia causes organ damage and pain and increases the risk of infection. °CAUSES  °Sickle cell anemia is a genetic disorder. Those who receive two copies of the gene have the condition, and those who receive one copy have the trait. °RISK FACTORS °The sickle cell gene is most common in people whose families originated in [place]. Other areas of the globe where sickle cell trait occurs include the [place], [place] and [place], the [place], and the [place].  °SIGNS AND SYMPTOMS °· Pain, especially in the extremities, back, chest, or abdomen (common). The pain may start suddenly or may develop following an illness, especially if there is dehydration. Pain can also occur due to overexertion or exposure to extreme temperature changes. °· Frequent severe bacterial infections, especially certain types of pneumonia and meningitis. °· Pain and swelling in the hands and feet. °· Decreased activity.   °· Loss of appetite.   °· Change in behavior. °· Headaches. °· Seizures. °· Shortness of breath or difficulty breathing. °· Vision changes. °· Skin ulcers. °Those with the trait may not have symptoms or they may have mild symptoms.  °DIAGNOSIS  °Sickle cell anemia is diagnosed with blood tests that demonstrate the genetic trait. It is often diagnosed during the newborn period, due to mandatory testing nationwide. A variety of blood tests, X-rays, CT scans, MRI scans, ultrasounds, and lung function tests may also be done to monitor the condition. °TREATMENT  °Sickle  cell anemia may be treated with: °· Medicines. You may be given pain medicines, antibiotic medicines (to treat and prevent infections) or medicines to increase the production of certain types of hemoglobin. °· Fluids. °· Oxygen. °· Blood transfusions. °HOME CARE INSTRUCTIONS  °· Drink enough fluid to keep your urine clear or pale yellow. Increase your fluid intake in hot weather and during exercise. °· Do not smoke. Smoking lowers oxygen levels in the blood.   °· Only take over-the-counter or prescription medicines for pain, fever, or discomfort as directed by your health care provider. °· Take antibiotics as directed by your health care provider. Make sure you finish them it even if you start to feel better.   °· Take supplements as directed by your health care provider.   °· Consider wearing a medical alert bracelet. This tells anyone caring for you in an emergency of your condition.   °· When traveling, keep your medical information, health care provider's names, and the medicines you take with you at all times.   °· If you develop a fever, do not take medicines to reduce the fever right away. This could cover up a problem that is developing. Notify your health care provider. °· Keep all follow-up appointments with your health care provider. Sickle cell anemia requires regular medical care. °SEEK MEDICAL CARE IF: ° You have a fever. °SEEK IMMEDIATE MEDICAL CARE IF:  °· You feel dizzy or faint.   °· You have new abdominal pain, especially on the left side near the stomach area.   °· You develop a persistent, often uncomfortable and painful penile erection (priapism). If this is not treated immediately it   will lead to impotence.   °· You have numbness your arms or legs or you have a hard time moving them.   °· You have a hard time with speech.   °· You have a fever or persistent symptoms for more than 2-3 days.   °· You have a fever and your symptoms suddenly get worse.   °· You have signs or symptoms of infection.  These include:   °¨ Chills.   °¨ Abnormal tiredness (lethargy).   °¨ Irritability.   °¨ Poor eating.   °¨ Vomiting.   °· You develop pain that is not helped with medicine.   °· You develop shortness of breath. °· You have pain in your chest.   °· You are coughing up pus-like or bloody sputum.   °· You develop a stiff neck. °· Your feet or hands swell or have pain. °· Your abdomen appears bloated. °· You develop joint pain. °MAKE SURE YOU: °· Understand these instructions. °Document Released: 04/27/2005 Document Revised: 06/03/2013 Document Reviewed: 08/29/2012 °[employer]® Patient Information ©2015 [employer]. This information is not intended to replace advice given to you by your health care provider. Make sure you discuss any questions you have with your health care provider. ° °

## 2014-10-16 NOTE — ED Provider Notes (Signed)
CSN: [id number]     Arrival date & time 10/16/14  0341 History   First MD Initiated Contact with Patient 10/16/14 [phone]     Chief Complaint  Patient presents with  . Sickle Cell Pain Crisis    8/10 all over     (Consider location/radiation/quality/duration/timing/severity/associated sxs/prior Treatment) HPI Patient presents with bilateral lower rib pain and thoracic back pain for the past 2 days. He states this is his normal sickle cell pain. His pain medication at home has been of little relief. He denies any fever, cough or difficulty breathing. No nausea, vomiting, abdominal pain or diarrhea. States he's been drinking plenty of fluid. Is followed at the sickle cell clinic. Past Medical History  Diagnosis Date  . Sickle cell anemia   . Blood transfusion   . Acute embolism and thrombosis of right internal jugular vein   . Hypokalemia   . Mood disorder   . History of pulmonary embolus (PE)   . Avascular necrosis   . Leukocytosis     Chronic  . Thrombocytosis     Chronic  . Hypertension   . History of Clostridium difficile infection   . Uses marijuana   . Chronic anticoagulation   . Functional asplenia   . Former smoker   . Second hand tobacco smoke exposure   . Alcohol consumption of one to four drinks per day   . Noncompliance with medication regimen   . Sickle-cell crisis with associated acute chest syndrome 05/13/2013  . Acute chest syndrome 06/18/2013  . Demand ischemia 01/02/2014   Past Surgical History  Procedure Laterality Date  . Right hip replacement      08/2006  . Cholecystectomy      01/2008  . Porta cath placement    . Porta cath removal    . Umbilical hernia repair      01/2008  . Excision of left periauricular cyst      10/2009  . Excision of right ear lobe cyst with primary closur      11/2007  . Portacath placement  01/05/2012    Procedure: INSERTION PORT-A-CATH;  Surgeon: Odis Hollingshead, MD;  Location: [facility];  Service: General;  Laterality: N/A;   ultrasound guiced port a cath insertion with fluoroscopy   Family History  Problem Relation Age of Onset  . Sickle cell trait Mother   . Depression Mother   . Diabetes Mother   . Sickle cell trait Father   . Sickle cell trait Brother    Social History  Substance Use Topics  . Smoking status: Former Smoker -- 13 years    Quit date: 07/08/2010  . Smokeless tobacco: Never Used  . Alcohol Use: No    Review of Systems  Constitutional: Negative for fever and chills.  Respiratory: Negative for shortness of breath.   Cardiovascular: Negative for chest pain.  Gastrointestinal: Negative for nausea, vomiting and abdominal pain.  Musculoskeletal: Positive for myalgias and back pain. Negative for neck pain and neck stiffness.  Skin: Negative for rash and wound.  Neurological: Negative for dizziness, weakness, light-headedness, numbness and headaches.  All other systems reviewed and are negative.     Allergies  Review of patient's allergies indicates no known allergies.  Home Medications   Prior to Admission medications   Medication Sig Start Date End Date Taking? Authorizing Provider  Cholecalciferol (VITAMIN D) 2000 UNITS tablet Take 1 tablet (2,000 Units total) by mouth daily. 02/13/14  Yes [name], MD  diltiazem (CARDIZEM CD)  120 MG 24 hr capsule Take 1 capsule (120 mg total) by mouth daily. 03/13/14  Yes Costin Karlyne Greenspan, MD  enoxaparin (LOVENOX) 150 MG/ML injection Inject 0.73 mLs (110 mg total) into the skin daily. 09/26/14  Yes Micheline Chapman, NP  folic acid (FOLVITE) 1 MG tablet Take 1 tablet (1 mg total) by mouth every morning. 10/08/13  Yes [name], MD  HYDROmorphone (DILAUDID) 4 MG tablet Take 1 tablet (4 mg total) by mouth every 4 (four) hours as needed for severe pain. 10/07/14  Yes Tresa Garter, MD  lisinopril (PRINIVIL,ZESTRIL) 10 MG tablet Take 1 tablet (10 mg total) by mouth daily. 01/22/14  Yes [name], MD  metoprolol tartrate  (LOPRESSOR) 25 MG tablet Take 1 tablet (25 mg total) by mouth daily. 08/22/13  Yes [name], MD  morphine (MS CONTIN) 30 MG 12 hr tablet Take 1 tablet (30 mg total) by mouth every 12 (twelve) hours. 09/09/14  Yes Tresa Garter, MD  potassium chloride SA (K-DUR,KLOR-CON) 20 MEQ tablet Take 1 tablet (20 mEq total) by mouth every morning. 06/09/14  Yes [name], MD  zolpidem (AMBIEN) 10 MG tablet Take 1 tablet (10 mg total) by mouth at bedtime as needed for sleep. 09/19/14  Yes Micheline Chapman, NP  aspirin 81 MG chewable tablet Chew 1 tablet (81 mg total) by mouth daily. Patient not taking: Reported on 10/02/2014 07/23/14   [name], MD  hydroxyurea (HYDREA) 500 MG capsule Take 3 capsules (1,500 mg total) by mouth daily. May take with food to minimize GI side effects. Patient not taking: Reported on 10/16/2014 09/16/14   Tresa Garter, MD   BP 118/79 mmHg  Pulse 93  Temp(Src) 98.7 F (37.1 C) (Oral)  Resp 24  Ht 6' (1.829 m)  Wt 162 lb (73.483 kg)  BMI 21.97 kg/m2  SpO2 94%  PF  Physical Exam  Constitutional: He is oriented to person, place, and time. He appears well-developed and well-nourished. No distress.  HENT:  Head: Normocephalic and atraumatic.  Mouth/Throat: Oropharynx is clear and moist.  Eyes: EOM are normal. Pupils are equal, round, and reactive to light.  Neck: Normal range of motion. Neck supple.  Cardiovascular: Normal rate and regular rhythm.   Pulmonary/Chest: Effort normal and breath sounds normal. No respiratory distress. He has no wheezes. He has no rales. He exhibits no tenderness.  Abdominal: Soft. Bowel sounds are normal. He exhibits no distension and no mass. There is no tenderness. There is no rebound and no guarding.  Musculoskeletal: Normal range of motion. He exhibits no edema or tenderness.  No midline thoracic or lumbar tenderness to palpation. No lower extremity swelling or pain.  Neurological: He is alert and oriented to  person, place, and time.  Moves all extremities without deficit. Sensation is fully intact.  Skin: Skin is warm and dry. No rash noted. No erythema.  Psychiatric: He has a normal mood and affect. His behavior is normal.  Nursing note and vitals reviewed.   ED Course  Procedures (including critical care time) Labs Review Labs Reviewed  COMPREHENSIVE METABOLIC PANEL - Abnormal; Notable for the following:    Glucose, Bld 117 (*)    Total Bilirubin 4.1 (*)    All other components within normal limits  CBC WITH DIFFERENTIAL/PLATELET - Abnormal; Notable for the following:    WBC 11.6 (*)    RBC 2.03 (*)    Hemoglobin 6.7 (*)    HCT 19.8 (*)  RDW 20.0 (*)    Monocytes Absolute 1.2 (*)    All other components within normal limits  RETICULOCYTES - Abnormal; Notable for the following:    Retic Ct Pct 10.3 (*)    RBC. 2.03 (*)    Retic Count, Manual 209.1 (*)    All other components within normal limits    Imaging Review No results found. I have personally reviewed and evaluated these images and lab results as part of my medical decision-making.   EKG Interpretation None      MDM   Final diagnoses:  Sickle cell pain crisis   Patient with persistent pain. We'll re-dose medication.  Patient's medicines redosed. Improved pain. Hemoglobin appears to be stable at baseline. Patient is asking to be transferred to sickle cell clinic when it opens at 8:30. Signed out to oncoming emergency physician.   Julianne Rice, MD 10/16/14 [phone]

## 2014-10-16 NOTE — Progress Notes (Signed)
Pt alert and oriented. Treated for sickle cell crises with Dilaudid pca and fluids. Pt states pain is now down to 4 from 8 on admission. Dilaudid pca was discontinued and po Dilaudid given. Pt was watched for 53mins, no distress noted.Pt's porta cath was flushed per protocol and deaccessed.  Pt discharged to home with discharge instructions.

## 2014-10-16 NOTE — Progress Notes (Signed)
Patient arrived from ED with RN Neoma Laming. Patient with port already accessed.  Port flushed, blood return noted.

## 2014-10-16 NOTE — ED Notes (Signed)
Pt states he is having pain in both his sides and in his neck  Pt states he has been hurting all week

## 2014-10-16 NOTE — H&P (Signed)
[facility] History and Physical   Date: 10/16/2014  Patient name: [name] Medical record number: [id number] Date of birth: 1980/11/19 Age: 33 y.o. Gender: male PCP: [name], MD  Attending physician: No att. providers found  Chief Complaint: Bilateral rib pain  History of Present Illness: [name], a 34 year old male with a history of sickle cell anemia, HbSS, presents with bilateral rib pain consistent with sickle cell anemia. Patient was evaluated in the emergency department and transitioned to the day infusion center for extended observation. He states that rib pain intensified on last night and was unrelieved by home medications. He states that his current pain intensity is 7/10, unrelieved by IV dilaudid received in the emergency department. He describes pain as constant, aching, and occasionally sharp. He continues to take home medications consistently, but is currently not taking Hydrea due to hemoglobin instability. He currently denies headache, shortness of breath, abdominal pain, nausea, vomiting, or diarrhea.   Meds: Prescriptions prior to admission  Medication Sig Dispense Refill Last Dose  . Cholecalciferol (VITAMIN D) 2000 UNITS tablet Take 1 tablet (2,000 Units total) by mouth daily. 90 tablet 3 10/15/2014 at Unknown time  . diltiazem (CARDIZEM CD) 120 MG 24 hr capsule Take 1 capsule (120 mg total) by mouth daily. 30 capsule 1 10/15/2014 at Unknown time  . enoxaparin (LOVENOX) 150 MG/ML injection Inject 0.73 mLs (110 mg total) into the skin daily. 22 Syringe 5 10/15/2014 at Unknown time  . folic acid (FOLVITE) 1 MG tablet Take 1 tablet (1 mg total) by mouth every morning. 30 tablet 11 10/15/2014 at Unknown time  . HYDROmorphone (DILAUDID) 4 MG tablet Take 1 tablet (4 mg total) by mouth every 4 (four) hours as needed for severe pain. 90 tablet 0 10/16/2014 at 0200  . lisinopril (PRINIVIL,ZESTRIL) 10 MG tablet Take 1 tablet (10 mg total)  by mouth daily. 30 tablet 1 10/15/2014 at Unknown time  . metoprolol tartrate (LOPRESSOR) 25 MG tablet Take 1 tablet (25 mg total) by mouth daily. 30 tablet 6 10/15/2014 at 0930  . morphine (MS CONTIN) 30 MG 12 hr tablet Take 1 tablet (30 mg total) by mouth every 12 (twelve) hours. 60 tablet 0 10/15/2014 at Unknown time  . potassium chloride SA (K-DUR,KLOR-CON) 20 MEQ tablet Take 1 tablet (20 mEq total) by mouth every morning. 30 tablet 3 10/15/2014 at Unknown time  . zolpidem (AMBIEN) 10 MG tablet Take 1 tablet (10 mg total) by mouth at bedtime as needed for sleep. 30 tablet 0 Past Week at Unknown time  . aspirin 81 MG chewable tablet Chew 1 tablet (81 mg total) by mouth daily. (Patient not taking: Reported on 10/02/2014) 30 tablet 11 Unknown at Unknown time  . hydroxyurea (HYDREA) 500 MG capsule Take 3 capsules (1,500 mg total) by mouth daily. May take with food to minimize GI side effects. (Patient not taking: Reported on 10/16/2014) 90 capsule 3 Not Taking at Unknown time    Allergies: Review of patient's allergies indicates no known allergies. Past Medical History  Diagnosis Date  . Sickle cell anemia   . Blood transfusion   . Acute embolism and thrombosis of right internal jugular vein   . Hypokalemia   . Mood disorder   . History of pulmonary embolus (PE)   . Avascular necrosis   . Leukocytosis     Chronic  . Thrombocytosis     Chronic  . Hypertension   . History of Clostridium difficile infection   .  Uses marijuana   . Chronic anticoagulation   . Functional asplenia   . Former smoker   . Second hand tobacco smoke exposure   . Alcohol consumption of one to four drinks per day   . Noncompliance with medication regimen   . Sickle-cell crisis with associated acute chest syndrome 05/13/2013  . Acute chest syndrome 06/18/2013  . Demand ischemia 01/02/2014   Past Surgical History  Procedure Laterality Date  . Right hip replacement      08/2006  . Cholecystectomy      01/2008  . Porta  cath placement    . Porta cath removal    . Umbilical hernia repair      01/2008  . Excision of left periauricular cyst      10/2009  . Excision of right ear lobe cyst with primary closur      11/2007  . Portacath placement  01/05/2012    Procedure: INSERTION PORT-A-CATH;  Surgeon: Odis Hollingshead, MD;  Location: [facility];  Service: General;  Laterality: N/A;  ultrasound guiced port a cath insertion with fluoroscopy   Family History  Problem Relation Age of Onset  . Sickle cell trait Mother   . Depression Mother   . Diabetes Mother   . Sickle cell trait Father   . Sickle cell trait Brother    Social History   Social History  . Marital Status: Single    Spouse Name: N/A  . Number of Children: 0  . Years of Education: 13   Occupational History  . 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 Smoking status: Former Smoker -- 13 years    Quit date: 07/08/2010  . Smokeless tobacco: Never Used  . Alcohol Use: No  . Drug Use: 2.00 per week    Special: Marijuana  . Sexual Activity:    Partners: Female    Museum/gallery curator: None     Comment: month ago   Other Topics Concern  . Not on file   Social History Narrative   Lives in an apartment.  Single.  Lives alone but has a girlfriend that helps care for him.  Does not use any assist devices.        [name]:  [phone] Mom, emergency contact    Review of Systems: Constitutional: negative for fatigue, fevers and malaise Eyes: negative for icterus and visual disturbance Ears, nose, mouth, throat, and face: negative for tinnitus Respiratory: negative for cough and dyspnea on exertion Cardiovascular: negative for chest pain, dyspnea, lower extremity edema, palpitations and tachypnea Gastrointestinal: negative for constipation, diarrhea, jaundice and nausea Genitourinary:negative Integument/breast: negative Hematologic/lymphatic:  negative Musculoskeletal:positive for myalgias Neurological: negative Behavioral/Psych: negative Endocrine: negative Allergic/Immunologic: negative  Physical Exam: Blood pressure 126/88, pulse 62, temperature 97.7 F (36.5 C), temperature source Oral, resp. rate 16, height 6' (1.829 m), weight 162 lb (73.483 kg), SpO2 100 %. BP 118/79 mmHg  Pulse 93  Temp(Src) 98.7 F (37.1 C) (Oral)  Resp 24  Ht 6' (1.829 m)  Wt 162 lb (73.483 kg)  BMI 21.97 kg/m2  SpO2 94%  PF   General Appearance:    Alert, cooperative, no distress, appears older than stated age  Head:    Normocephalic, without obvious abnormality, atraumatic  Eyes:    PERRL, conjunctiva/corneas clear, EOM's intact, fundi    benign, both eyes       Ears:    Normal TM's and  external ear canals, both ears  Nose:   Nares normal, septum midline, mucosa normal, no drainage    or sinus tenderness  Throat:   Lips, mucosa, and tongue normal; teeth and gums normal  Neck:   Supple, symmetrical, trachea midline, no adenopathy;       thyroid:  No enlargement/tenderness/nodules; no carotid   bruit or JVD  Back:     Symmetric, no curvature, ROM normal, no CVA tenderness  Lungs:     Clear to auscultation bilaterally, respirations unlabored  Chest wall:    No tenderness or deformity  Heart:    Regular rate and rhythm, S1 and S2 normal, no murmur, rub   or gallop  Abdomen:     Soft, non-tender, bowel sounds active all four quadrants,    no masses, no organomegaly  Extremities:   Extremities normal, atraumatic, no cyanosis or edema  Pulses:   2+ and symmetric all extremities  Skin:   Skin color, texture, turgor normal, no rashes or lesions  Lymph nodes:   Cervical, supraclavicular, and axillary nodes normal  Neurologic:   CNII-XII intact. Normal strength, sensation and reflexes      throughout    Lab results: Results for orders placed or performed during the hospital encounter of 10/16/14 (from the past 24 hour(s))  Comprehensive  metabolic panel     Status: Abnormal   Collection Time: 10/16/14  5:14 AM  Result Value Ref Range   Sodium 140 135 - 145 mmol/L   Potassium 3.9 3.5 - 5.1 mmol/L   Chloride 110 101 - 111 mmol/L   CO2 25 22 - 32 mmol/L   Glucose, Bld 117 (H) 65 - 99 mg/dL   BUN 10 6 - 20 mg/dL   Creatinine, Ser 0.69 0.61 - 1.24 mg/dL   Calcium 9.0 8.9 - 10.3 mg/dL   Total Protein 7.4 6.5 - 8.1 g/dL   Albumin 4.1 3.5 - 5.0 g/dL   AST 33 15 - 41 U/L   ALT 24 17 - 63 U/L   Alkaline Phosphatase 77 38 - 126 U/L   Total Bilirubin 4.1 (H) 0.3 - 1.2 mg/dL   GFR calc non Af Amer >60 >60 mL/min   GFR calc Af Amer >60 >60 mL/min   Anion gap 5 5 - 15  CBC with Differential/Platelet     Status: Abnormal   Collection Time: 10/16/14  5:14 AM  Result Value Ref Range   WBC 11.6 (H) 4.0 - 10.5 K/uL   RBC 2.03 (L) 4.22 - 5.81 MIL/uL   Hemoglobin 6.7 (LL) 13.0 - 17.0 g/dL   HCT 19.8 (L) 39.0 - 52.0 %   MCV 97.5 78.0 - 100.0 fL   MCH 33.0 26.0 - 34.0 pg   MCHC 33.8 30.0 - 36.0 g/dL   RDW 20.0 (H) 11.5 - 15.5 %   Platelets 341 150 - 400 K/uL   Neutrophils Relative % 61 %   Neutro Abs 7.1 1.7 - 7.7 K/uL   Lymphocytes Relative 24 %   Lymphs Abs 2.7 0.7 - 4.0 K/uL   Monocytes Relative 11 %   Monocytes Absolute 1.2 (H) 0.1 - 1.0 K/uL   Eosinophils Relative 4 %   Eosinophils Absolute 0.5 0.0 - 0.7 K/uL   Basophils Relative 1 %   Basophils Absolute 0.1 0.0 - 0.1 K/uL  Reticulocytes     Status: Abnormal   Collection Time: 10/16/14  5:14 AM  Result Value Ref Range   Retic Ct Pct 10.3 (H) 0.4 -  3.1 %   RBC. 2.03 (L) 4.22 - 5.81 MIL/uL   Retic Count, Manual 209.1 (H) 19.0 - 186.0 K/uL   *Note: Due to a large number of results and/or encounters for the requested time period, some results have not been displayed. A complete set of results can be found in Results Review.    Imaging results:  No results found.   Assessment & Plan:  Patient will be admitted to the day infusion center for extended  observation  Mr. Divelbiss was started on IV D5.45 for cellular rehydration at 125/hr  Start Toradol 30 mg IV every 6 hours for inflammation.  Start Dilaudid PCA High Concentration per weight based protocol.   Patient will be re-evaluated for pain intensity in the context of function and relationship to baseline as care progresses.  If no significant pain relief, will transfer patient to inpatient services for a higher level of care.   Reviewed labs obtained in the emergency department. Current hemoglobin 6.7, hemoglogin remains greater than 6 will not transfuse at this point. Patient asymptomatic.    Hollis,Lachina M 10/16/2014, 10:58 AM

## 2014-10-16 NOTE — ED Notes (Signed)
Hilda Blades, RN from [facility] here to take patient to sickle cell clinic. Report given to Hilda Blades, Therapist, sports.

## 2014-10-20 ENCOUNTER — Telehealth: Payer: Self-pay

## 2014-10-20 ENCOUNTER — Emergency Department (HOSPITAL_COMMUNITY)
Admission: EM | Admit: 2014-10-20 | Discharge: 2014-10-21 | Disposition: A | Discharge status: Home / Self Care | Payer: Medicare Other | Attending: Emergency Medicine | Admitting: Emergency Medicine

## 2014-10-20 ENCOUNTER — Other Ambulatory Visit: Payer: Self-pay | Admitting: Internal Medicine

## 2014-10-20 DIAGNOSIS — Z87891 Personal history of nicotine dependence: Secondary | ICD-10-CM | POA: Insufficient documentation

## 2014-10-20 DIAGNOSIS — D5700 Hb-SS disease with crisis, unspecified: Secondary | ICD-10-CM | POA: Diagnosis not present

## 2014-10-20 DIAGNOSIS — Z86711 Personal history of pulmonary embolism: Secondary | ICD-10-CM | POA: Insufficient documentation

## 2014-10-20 DIAGNOSIS — Z7982 Long term (current) use of aspirin: Secondary | ICD-10-CM | POA: Insufficient documentation

## 2014-10-20 DIAGNOSIS — D571 Sickle-cell disease without crisis: Secondary | ICD-10-CM | POA: Diagnosis not present

## 2014-10-20 DIAGNOSIS — Z7901 Long term (current) use of anticoagulants: Secondary | ICD-10-CM | POA: Diagnosis not present

## 2014-10-20 DIAGNOSIS — Z86718 Personal history of other venous thrombosis and embolism: Secondary | ICD-10-CM | POA: Diagnosis not present

## 2014-10-20 DIAGNOSIS — R079 Chest pain, unspecified: Secondary | ICD-10-CM | POA: Diagnosis not present

## 2014-10-20 DIAGNOSIS — G4700 Insomnia, unspecified: Secondary | ICD-10-CM

## 2014-10-20 DIAGNOSIS — I10 Essential (primary) hypertension: Secondary | ICD-10-CM | POA: Diagnosis not present

## 2014-10-20 MED ORDER — ZOLPIDEM TARTRATE 10 MG PO TABS
10.0000 mg | ORAL_TABLET | Freq: Every evening | ORAL | Status: DC | PRN
Start: 2014-10-20 — End: 2014-10-23

## 2014-10-20 NOTE — Telephone Encounter (Signed)
Refill request for zolpidem 10mg . LOV 10/07/2014. Please advise. Thanks!

## 2014-10-21 ENCOUNTER — Telehealth (HOSPITAL_COMMUNITY): Payer: Self-pay | Admitting: Hematology

## 2014-10-21 ENCOUNTER — Non-Acute Institutional Stay (HOSPITAL_BASED_OUTPATIENT_CLINIC_OR_DEPARTMENT_OTHER)
Admission: AD | Admit: 2014-10-21 | Discharge: 2014-10-21 | Disposition: A | Discharge status: Home / Self Care | Payer: Medicare Other | Source: Home / Self Care | Attending: Internal Medicine | Admitting: Internal Medicine

## 2014-10-21 ENCOUNTER — Encounter (HOSPITAL_COMMUNITY): Payer: Self-pay

## 2014-10-21 DIAGNOSIS — D5700 Hb-SS disease with crisis, unspecified: Secondary | ICD-10-CM | POA: Diagnosis present

## 2014-10-21 LAB — CBC WITH DIFFERENTIAL/PLATELET
BASOS ABS: 0.1 10*3/uL (ref 0.0–0.1)
BASOS PCT: 1 %
EOS ABS: 0.3 10*3/uL (ref 0.0–0.7)
Eosinophils Relative: 2 %
HEMATOCRIT: 24.7 % — AB (ref 39.0–52.0)
HEMOGLOBIN: 8.3 g/dL — AB (ref 13.0–17.0)
Lymphocytes Relative: 18 %
Lymphs Abs: 2.6 10*3/uL (ref 0.7–4.0)
MCH: 32.8 pg (ref 26.0–34.0)
MCHC: 33.6 g/dL (ref 30.0–36.0)
MCV: 97.6 fL (ref 78.0–100.0)
Monocytes Absolute: 1.7 10*3/uL — ABNORMAL HIGH (ref 0.1–1.0)
Monocytes Relative: 12 %
NEUTROS ABS: 9.8 10*3/uL — AB (ref 1.7–7.7)
NEUTROS PCT: 67 %
Platelets: 611 10*3/uL — ABNORMAL HIGH (ref 150–400)
RBC: 2.53 MIL/uL — ABNORMAL LOW (ref 4.22–5.81)
RDW: 19.1 % — AB (ref 11.5–15.5)
WBC: 14.4 10*3/uL — AB (ref 4.0–10.5)

## 2014-10-21 LAB — COMPREHENSIVE METABOLIC PANEL
ALK PHOS: 97 U/L (ref 38–126)
ALT: 24 U/L (ref 17–63)
ANION GAP: 8 (ref 5–15)
AST: 33 U/L (ref 15–41)
Albumin: 4.6 g/dL (ref 3.5–5.0)
BILIRUBIN TOTAL: 4.1 mg/dL — AB (ref 0.3–1.2)
BUN: 9 mg/dL (ref 6–20)
CALCIUM: 8.9 mg/dL (ref 8.9–10.3)
CO2: 23 mmol/L (ref 22–32)
CREATININE: 0.73 mg/dL (ref 0.61–1.24)
Chloride: 106 mmol/L (ref 101–111)
Glucose, Bld: 105 mg/dL — ABNORMAL HIGH (ref 65–99)
Potassium: 3.7 mmol/L (ref 3.5–5.1)
SODIUM: 137 mmol/L (ref 135–145)
TOTAL PROTEIN: 8.5 g/dL — AB (ref 6.5–8.1)

## 2014-10-21 LAB — LACTATE DEHYDROGENASE: LDH: 332 U/L — ABNORMAL HIGH (ref 98–192)

## 2014-10-21 LAB — RETICULOCYTES
RBC.: 2.53 MIL/uL — AB (ref 4.22–5.81)
RETIC COUNT ABSOLUTE: 265.7 10*3/uL — AB (ref 19.0–186.0)
RETIC CT PCT: 10.5 % — AB (ref 0.4–3.1)

## 2014-10-21 IMAGING — US US RENAL
1 series · 14 of 25 positions shown · non-contrast
Comparison: Abdominal ultrasound 01/11/2011 and 09/10/2011.

CLINICAL DATA: Sickle cell disease.  Hypertension.

RENAL/URINARY TRACT ULTRASOUND COMPLETE

[Series 1: us renal · 0.32mm/px · 14 of 60 slices shown]
[im 1/60]
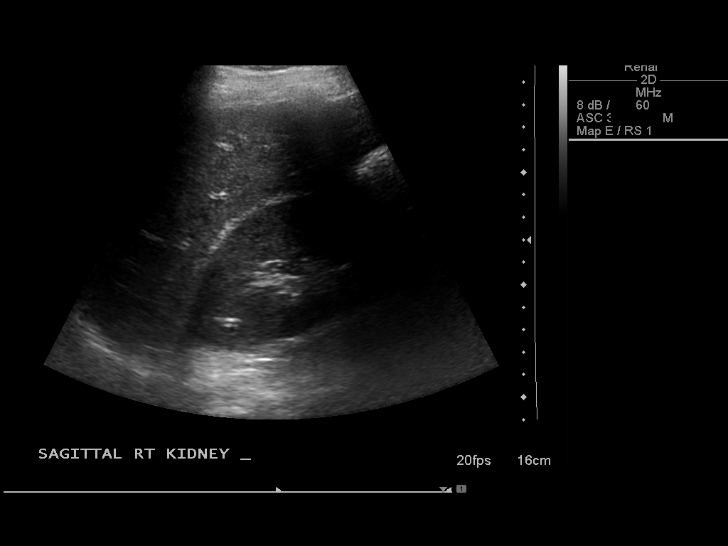
[im 5/60]
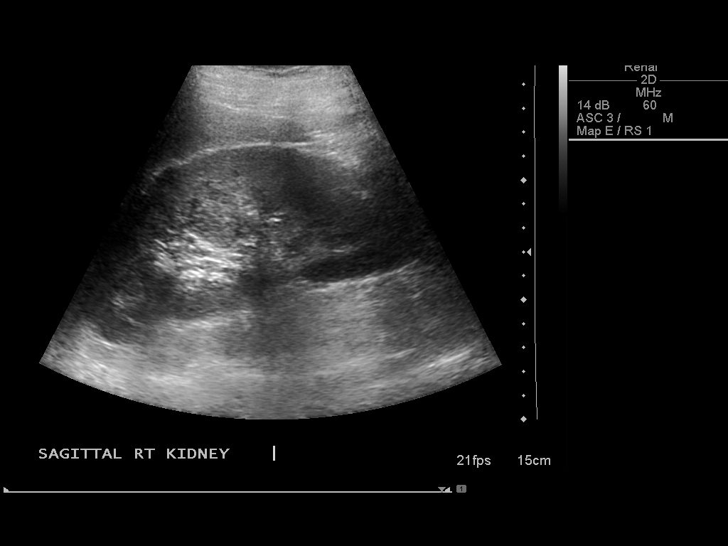
[im 10/60]
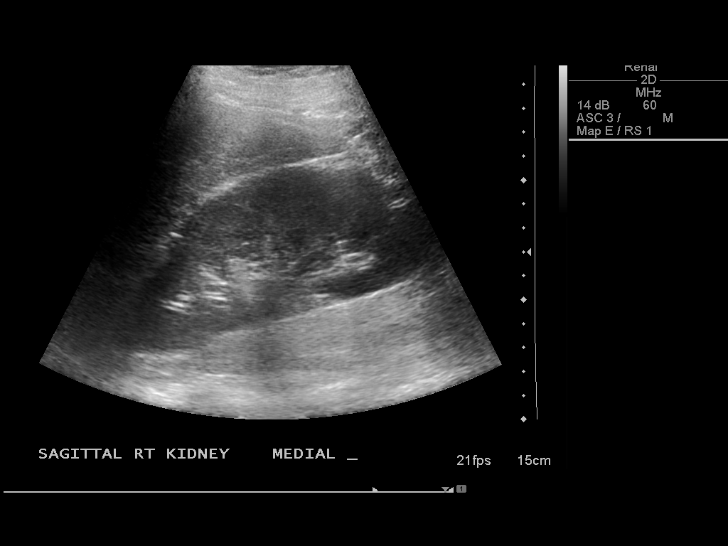
[im 15/60]
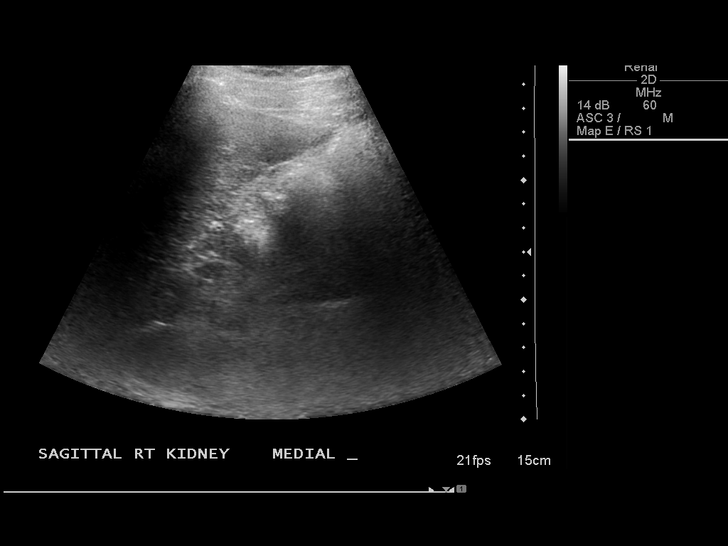
[im 20/60]
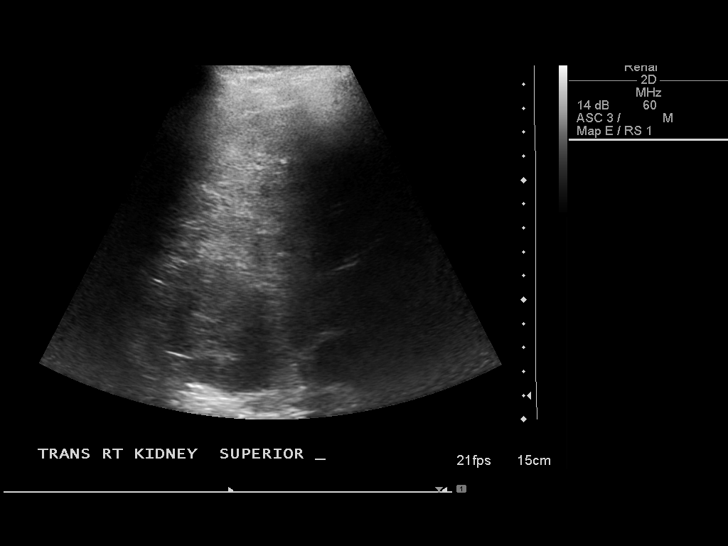
[im 23/60]
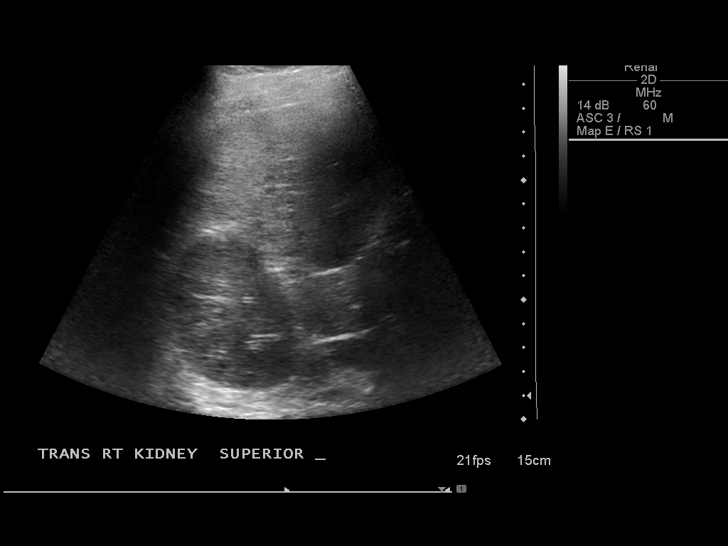
[im 28/60]
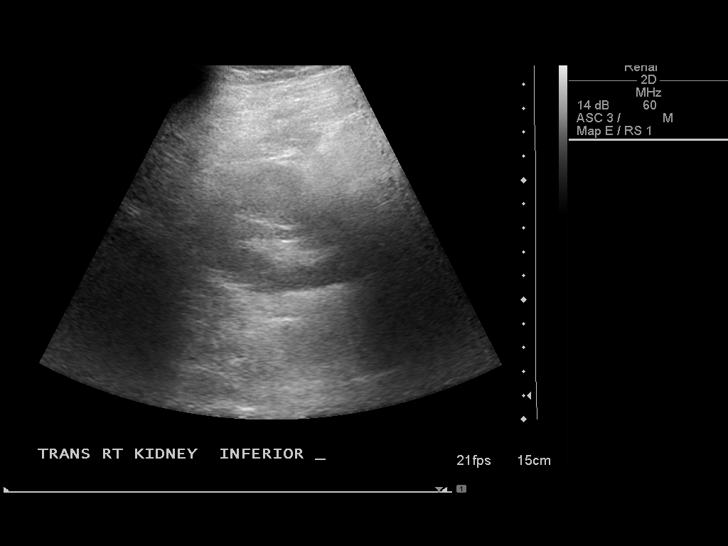
[im 32/60]
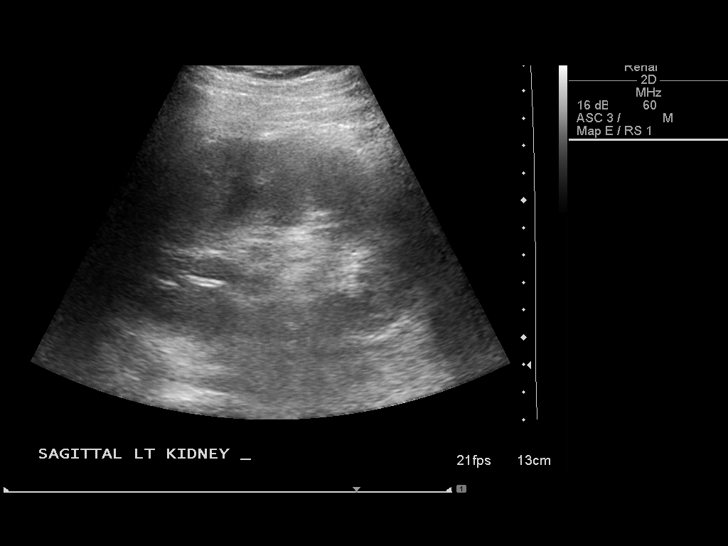
[im 37/60]
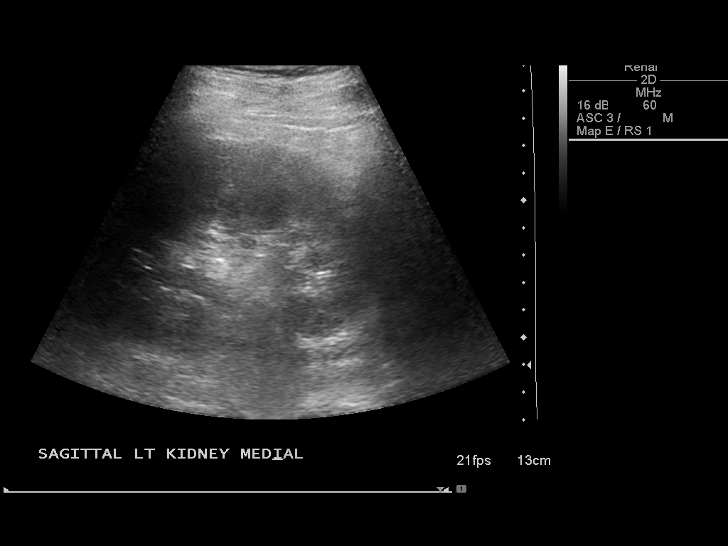
[im 40/60]
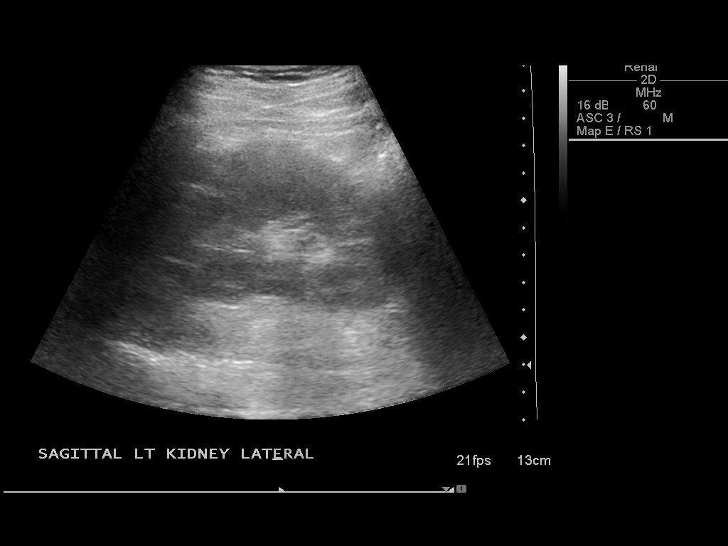
[im 45/60]
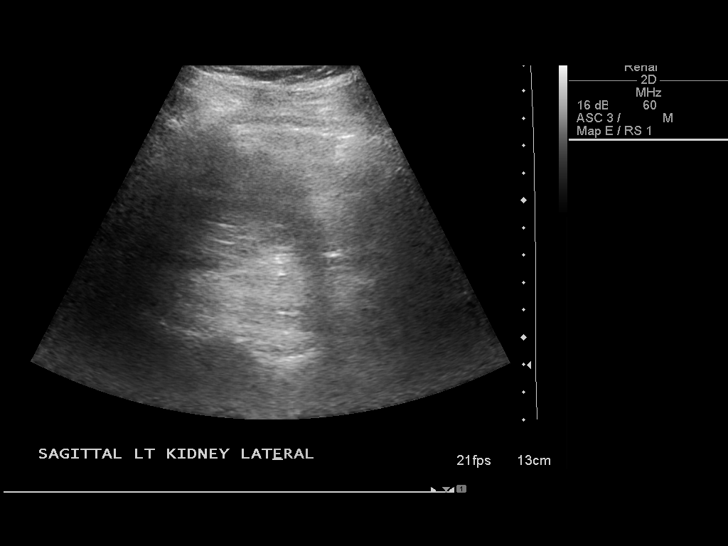
[im 50/60]
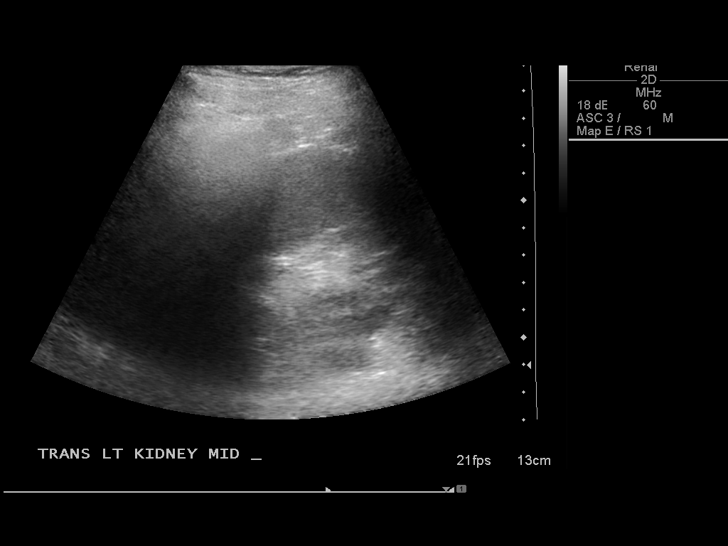
[im 55/60]
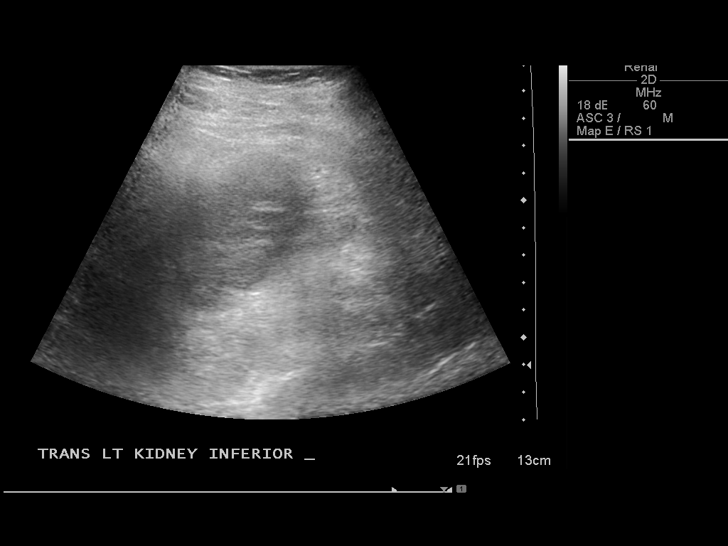
[im 60/60]
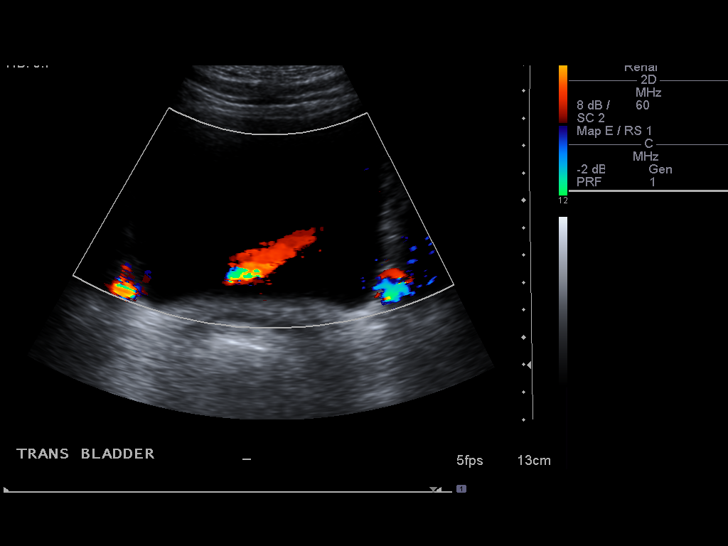

[14 of 25 positions shown; findings below may reference images not displayed]

FINDINGS: Right Kidney:  Measures 14.8 cm.  No stone, mass or hydronephrosis.
Possible small angiomyolipoma in the upper pole of the right kidney
is unchanged.

Left Kidney:  Measures 12.4 cm.  No stone, mass or hydronephrosis.

Bladder:  Bilateral ureteral jets are identified.  Urinary bladder
is otherwise unremarkable.
IMPRESSION: No acute finding.  Stable compared to prior exam.

## 2014-10-21 MED ORDER — NALOXONE HCL 0.4 MG/ML IJ SOLN: 0.4000 mg | INTRAMUSCULAR | Status: DC | PRN

## 2014-10-21 MED ORDER — HEPARIN SOD (PORK) LOCK FLUSH 100 UNIT/ML IV SOLN
500.0000 [IU] | INTRAVENOUS | Status: AC | PRN
  Administered 2014-10-21: 500 [IU]
  Filled 2014-10-21: qty 5

## 2014-10-21 MED ORDER — FENTANYL CITRATE (PF) 100 MCG/2ML IJ SOLN
50.0000 ug | Freq: Once | INTRAMUSCULAR | Status: AC
  Administered 2014-10-21: 50 ug via INTRAVENOUS
  Filled 2014-10-21: qty 2

## 2014-10-21 MED ORDER — SODIUM CHLORIDE 0.9 % IV SOLN
12.5000 mg | Freq: Four times a day (QID) | INTRAVENOUS | Status: DC | PRN
  Filled 2014-10-21: qty 0.25

## 2014-10-21 MED ORDER — DIPHENHYDRAMINE HCL 12.5 MG/5ML PO ELIX
12.5000 mg | ORAL_SOLUTION | Freq: Four times a day (QID) | ORAL | Status: DC | PRN
  Administered 2014-10-21: 12.5 mg via ORAL
  Filled 2014-10-21: qty 5

## 2014-10-21 MED ORDER — HEPARIN SOD (PORK) LOCK FLUSH 100 UNIT/ML IV SOLN
500.0000 [IU] | Freq: Once | INTRAVENOUS | Status: AC
  Administered 2014-10-21: 500 [IU]
  Filled 2014-10-21: qty 5

## 2014-10-21 MED ORDER — ONDANSETRON HCL 4 MG/2ML IJ SOLN
4.0000 mg | Freq: Once | INTRAMUSCULAR | Status: AC
  Administered 2014-10-21: 4 mg via INTRAVENOUS
  Filled 2014-10-21: qty 2

## 2014-10-21 MED ORDER — OXYCODONE-ACETAMINOPHEN 5-325 MG PO TABS
2.0000 | ORAL_TABLET | Freq: Once | ORAL | Status: AC
  Administered 2014-10-21: 2 via ORAL
  Filled 2014-10-21: qty 2

## 2014-10-21 MED ORDER — DEXTROSE-NACL 5-0.45 % IV SOLN
INTRAVENOUS | Status: DC
  Administered 2014-10-21: 10:00:00 via INTRAVENOUS

## 2014-10-21 MED ORDER — ONDANSETRON HCL 4 MG/2ML IJ SOLN: 4.0000 mg | Freq: Four times a day (QID) | INTRAMUSCULAR | Status: DC | PRN

## 2014-10-21 MED ORDER — KETOROLAC TROMETHAMINE 30 MG/ML IJ SOLN
30.0000 mg | Freq: Four times a day (QID) | INTRAMUSCULAR | Status: DC
  Administered 2014-10-21: 30 mg via INTRAVENOUS
  Filled 2014-10-21: qty 1

## 2014-10-21 MED ORDER — SODIUM CHLORIDE 0.9 % IJ SOLN: 9.0000 mL | INTRAMUSCULAR | Status: DC | PRN

## 2014-10-21 MED ORDER — SODIUM CHLORIDE 0.9 % IJ SOLN
10.0000 mL | INTRAMUSCULAR | Status: AC | PRN
  Administered 2014-10-21: 10 mL

## 2014-10-21 MED ORDER — HYDROMORPHONE 2 MG/ML HIGH CONCENTRATION IV PCA SOLN
INTRAVENOUS | Status: DC
  Administered 2014-10-21: 14.7 mg via INTRAVENOUS
  Administered 2014-10-21: 10:00:00 via INTRAVENOUS
  Filled 2014-10-21: qty 25

## 2014-10-21 NOTE — Telephone Encounter (Signed)
Patient C/O pain to ribs back and legs that is 9/10 on pain scale.  Patient denies chest pain, shortness of breath or difficulty breathing.  Patient denies abdominal pain, fever, N/V.  Patient requesting to come to [facility].  I advised I would notify the physician and give a call back.  Patient verbalizes understanding

## 2014-10-21 NOTE — Progress Notes (Signed)
Pt discharged to home; discharge instructions explained, given, and signed; all questions answered; port deaccessed with no complications noted; pt alert and oriented; ride available for transport home

## 2014-10-21 NOTE — ED Provider Notes (Signed)
CSN: [id number]     Arrival date & time 10/20/14  2248 History   First MD Initiated Contact with Patient 10/21/14 0226     Chief Complaint  Patient presents with  . Sickle Cell Pain Crisis     (Consider location/radiation/quality/duration/timing/severity/associated sxs/prior Treatment) HPI Comments: Patient with a history of sickle cell disease presents with typical pain crisis affecting back and abdomen. No SOB, cough or fever. He has been taking his regular pain mediations at home without relief.   Patient is a 34 y.o. male presenting with sickle cell pain. The history is provided by the patient. No language interpreter was used.  Sickle Cell Pain Crisis Associated symptoms: nausea   Associated symptoms: no cough, no fever, no shortness of breath and no vomiting     Past Medical History  Diagnosis Date  . Sickle cell anemia   . Blood transfusion   . Acute embolism and thrombosis of right internal jugular vein   . Hypokalemia   . Mood disorder   . History of pulmonary embolus (PE)   . Avascular necrosis   . Leukocytosis     Chronic  . Thrombocytosis     Chronic  . Hypertension   . History of Clostridium difficile infection   . Uses marijuana   . Chronic anticoagulation   . Functional asplenia   . Former smoker   . Second hand tobacco smoke exposure   . Alcohol consumption of one to four drinks per day   . Noncompliance with medication regimen   . Sickle-cell crisis with associated acute chest syndrome 05/13/2013  . Acute chest syndrome 06/18/2013  . Demand ischemia 01/02/2014   Past Surgical History  Procedure Laterality Date  . Right hip replacement      08/2006  . Cholecystectomy      01/2008  . Porta cath placement    . Porta cath removal    . Umbilical hernia repair      01/2008  . Excision of left periauricular cyst      10/2009  . Excision of right ear lobe cyst with primary closur      11/2007  . Portacath placement  01/05/2012    Procedure: INSERTION  PORT-A-CATH;  Surgeon: Odis Hollingshead, MD;  Location: [facility];  Service: General;  Laterality: N/A;  ultrasound guiced port a cath insertion with fluoroscopy   Family History  Problem Relation Age of Onset  . Sickle cell trait Mother   . Depression Mother   . Diabetes Mother   . Sickle cell trait Father   . Sickle cell trait Brother    Social History  Substance Use Topics  . Smoking status: Former Smoker -- 13 years    Quit date: 07/08/2010  . Smokeless tobacco: Never Used  . Alcohol Use: No    Review of Systems  Constitutional: Negative for fever and chills.  HENT: Negative.   Respiratory: Negative.  Negative for cough and shortness of breath.   Cardiovascular: Negative.   Gastrointestinal: Positive for nausea and abdominal pain. Negative for vomiting.  Musculoskeletal: Negative.   Skin: Negative.  Negative for rash.  Neurological: Negative.       Allergies  Review of patient's allergies indicates no known allergies.  Home Medications   Prior to Admission medications   Medication Sig Start Date End Date Taking? Authorizing Evalynn Hankins  Cholecalciferol (VITAMIN D) 2000 UNITS tablet Take 1 tablet (2,000 Units total) by mouth daily. 02/13/14  Yes [name], MD  diltiazem (CARDIZEM  CD) 120 MG 24 hr capsule Take 1 capsule (120 mg total) by mouth daily. 03/13/14  Yes Costin Karlyne Greenspan, MD  enoxaparin (LOVENOX) 150 MG/ML injection Inject 0.73 mLs (110 mg total) into the skin daily. 09/26/14  Yes Micheline Chapman, NP  folic acid (FOLVITE) 1 MG tablet Take 1 tablet (1 mg total) by mouth every morning. 10/08/13  Yes [name], MD  HYDROmorphone (DILAUDID) 4 MG tablet Take 1 tablet (4 mg total) by mouth every 4 (four) hours as needed for severe pain. 10/07/14  Yes Tresa Garter, MD  lisinopril (PRINIVIL,ZESTRIL) 10 MG tablet Take 1 tablet (10 mg total) by mouth daily. 01/22/14  Yes [name], MD  metoprolol tartrate (LOPRESSOR) 25 MG tablet Take 1 tablet  (25 mg total) by mouth daily. 08/22/13  Yes [name], MD  morphine (MS CONTIN) 30 MG 12 hr tablet Take 1 tablet (30 mg total) by mouth every 12 (twelve) hours. 09/09/14  Yes Tresa Garter, MD  potassium chloride SA (K-DUR,KLOR-CON) 20 MEQ tablet Take 1 tablet (20 mEq total) by mouth every morning. 06/09/14  Yes [name], MD  zolpidem (AMBIEN) 10 MG tablet Take 1 tablet (10 mg total) by mouth at bedtime as needed for sleep. 10/20/14  Yes Tresa Garter, MD  aspirin 81 MG chewable tablet Chew 1 tablet (81 mg total) by mouth daily. Patient not taking: Reported on 10/02/2014 07/23/14   [name], MD   BP 106/70 mmHg  Pulse 81  Temp(Src) 98.2 F (36.8 C) (Oral)  Resp 18  SpO2 98% Physical Exam  Constitutional: He is oriented to person, place, and time. He appears well-developed and well-nourished. No distress.  Comfortable appearing.   HENT:  Head: Normocephalic.  Eyes: Conjunctivae are normal.  Neck: Normal range of motion. Neck supple.  Cardiovascular: Normal rate and regular rhythm.   Pulmonary/Chest: Effort normal and breath sounds normal.  Abdominal: Soft. Bowel sounds are normal. There is tenderness. There is no rebound and no guarding.  Tender across upper abdomen. Soft. No mass.   Musculoskeletal: Normal range of motion.  Neurological: He is alert and oriented to person, place, and time. Coordination normal.  Skin: Skin is warm and dry. No rash noted.  Psychiatric: He has a normal mood and affect.    ED Course  Procedures (including critical care time) Labs Review Labs Reviewed  COMPREHENSIVE METABOLIC PANEL - Abnormal; Notable for the following:    Glucose, Bld 105 (*)    Total Protein 8.5 (*)    Total Bilirubin 4.1 (*)    All other components within normal limits  CBC WITH DIFFERENTIAL/PLATELET - Abnormal; Notable for the following:    WBC 14.4 (*)    RBC 2.53 (*)    Hemoglobin 8.3 (*)    HCT 24.7 (*)    RDW 19.1 (*)    Platelets 611  (*)    Neutro Abs 9.8 (*)    Monocytes Absolute 1.7 (*)    All other components within normal limits  RETICULOCYTES - Abnormal; Notable for the following:    Retic Ct Pct 10.5 (*)    RBC. 2.53 (*)    Retic Count, Manual 265.7 (*)    All other components within normal limits    Imaging Review No results found. I have personally reviewed and evaluated these images and lab results as part of my medical decision-making.   EKG Interpretation None      MDM   Final diagnoses:  None    1. Sickle cell disease 2. Abodminal/back pain  VS normal. The patient is given IV pain medications. Labs improved from multiple previous comparable lab tests over 3 weeks. Feel the patient can be discharged home to take regular pain medications and follow up with PCP tomorrow in office.     Charlann Lange, PA-C 10/21/14 [place], MD 10/21/14 [id number]

## 2014-10-21 NOTE — Telephone Encounter (Signed)
Called patient back and advised he can come to [facility]. Patient verbalized understanding.

## 2014-10-21 NOTE — H&P (Signed)
[facility] History and Physical   Date: 10/21/2014  Patient name: [name] Medical record number: [id number] Date of birth: 1980/06/22 Age: 34 y.o. Gender: male PCP: [name], MD  Attending physician: Tresa Garter, MD  Chief Complaint: Rib pain  History of Present Illness: [name], a 34 year old male with a history of sickle cell anemia, HbSS presents with a 2 day history of bilateral rib pain consistent with sickle cell anemia. Patient states that he was discharged from the emergency department around 3 am. Patient has had frequent emergency department visits and days hospital admissions over the past several weeks. He was recently taken off of hydroxyurea therapy. He states that current pain intensity is 9/10 unrelieved by Dilaudid 4 mg last taken at 6 am. He describes pain as constant and throbbing. [name] reports headache, but denies fatigue, shortness of breath, chest pain, abdominal pain, nausea, vomiting, and diarrhea.   Meds: Prescriptions prior to admission  Medication Sig Dispense Refill Last Dose  . aspirin 81 MG chewable tablet Chew 1 tablet (81 mg total) by mouth daily. 30 tablet 11 10/20/2014 at Unknown time  . Cholecalciferol (VITAMIN D) 2000 UNITS tablet Take 1 tablet (2,000 Units total) by mouth daily. 90 tablet 3 10/21/2014 at Unknown time  . diltiazem (CARDIZEM CD) 120 MG 24 hr capsule Take 1 capsule (120 mg total) by mouth daily. 30 capsule 1 10/21/2014 at Unknown time  . enoxaparin (LOVENOX) 150 MG/ML injection Inject 0.73 mLs (110 mg total) into the skin daily. 22 Syringe 5 10/21/2014 at Unknown time  . folic acid (FOLVITE) 1 MG tablet Take 1 tablet (1 mg total) by mouth every morning. 30 tablet 11 10/21/2014 at Unknown time  . HYDROmorphone (DILAUDID) 4 MG tablet Take 1 tablet (4 mg total) by mouth every 4 (four) hours as needed for severe pain. 90 tablet 0 10/21/2014 at Unknown time  . lisinopril (PRINIVIL,ZESTRIL)  10 MG tablet Take 1 tablet (10 mg total) by mouth daily. 30 tablet 1 10/21/2014 at Unknown time  . metoprolol tartrate (LOPRESSOR) 25 MG tablet Take 1 tablet (25 mg total) by mouth daily. 30 tablet 6 10/21/2014 at Unknown time  . morphine (MS CONTIN) 30 MG 12 hr tablet Take 1 tablet (30 mg total) by mouth every 12 (twelve) hours. 60 tablet 0 10/20/2014 at Unknown time  . potassium chloride SA (K-DUR,KLOR-CON) 20 MEQ tablet Take 1 tablet (20 mEq total) by mouth every morning. 30 tablet 3 10/21/2014 at Unknown time  . zolpidem (AMBIEN) 10 MG tablet Take 1 tablet (10 mg total) by mouth at bedtime as needed for sleep. 30 tablet 0 10/20/2014 at Unknown time    Allergies: Review of patient's allergies indicates no known allergies. Past Medical History  Diagnosis Date  . Sickle cell anemia   . Blood transfusion   . Acute embolism and thrombosis of right internal jugular vein   . Hypokalemia   . Mood disorder   . History of pulmonary embolus (PE)   . Avascular necrosis   . Leukocytosis     Chronic  . Thrombocytosis     Chronic  . Hypertension   . History of Clostridium difficile infection   . Uses marijuana   . Chronic anticoagulation   . Functional asplenia   . Former smoker   . Second hand tobacco smoke exposure   . Alcohol consumption of one to four drinks per day   . Noncompliance with medication regimen   .  Sickle-cell crisis with associated acute chest syndrome 05/13/2013  . Acute chest syndrome 06/18/2013  . Demand ischemia 01/02/2014   Past Surgical History  Procedure Laterality Date  . Right hip replacement      08/2006  . Cholecystectomy      01/2008  . Porta cath placement    . Porta cath removal    . Umbilical hernia repair      01/2008  . Excision of left periauricular cyst      10/2009  . Excision of right ear lobe cyst with primary closur      11/2007  . Portacath placement  01/05/2012    Procedure: INSERTION PORT-A-CATH;  Surgeon: Odis Hollingshead, MD;  Location: [facility];  Service: General;  Laterality: N/A;  ultrasound guiced port a cath insertion with fluoroscopy   Family History  Problem Relation Age of Onset  . Sickle cell trait Mother   . Depression Mother   . Diabetes Mother   . Sickle cell trait Father   . Sickle cell trait Brother    Social History   Social History  . Marital Status: Single    Spouse Name: N/A  . Number of Children: 0  . Years of Education: 13   Occupational History  . 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 Smoking status: Former Smoker -- 13 years    Quit date: 07/08/2010  . Smokeless tobacco: Never Used  . Alcohol Use: No  . Drug Use: 2.00 per week    Special: Marijuana  . Sexual Activity:    Partners: Female    Museum/gallery curator: None     Comment: month ago   Other Topics Concern  . Not on file   Social History Narrative   Lives in an apartment.  Single.  Lives alone but has a girlfriend that helps care for him.  Does not use any assist devices.        [name]:  [phone] Mom, emergency contact    Review of Systems: Constitutional: negative for fatigue, fevers and night sweats Eyes: positive for icterus Ears, nose, mouth, throat, and face: negative Respiratory: negative for cough and dyspnea on exertion Cardiovascular: negative for dyspnea, irregular heart beat and palpitations Gastrointestinal: negative for abdominal pain, constipation, diarrhea and nausea Genitourinary:negative Integument/breast: negative Hematologic/lymphatic: negative Musculoskeletal:positive for myalgias Neurological: negative for gait problems, speech problems, vertigo and weakness Behavioral/Psych: negative Endocrine: negative Allergic/Immunologic: negative  Physical Exam: Blood pressure 111/64, pulse 69, temperature 98.7 F (37.1 C), temperature source Oral, resp. rate 18, SpO2 99 %. BP 105/75 mmHg  Pulse 82  Temp(Src) 98.4 F (36.9 C)  (Oral)  Resp 15  SpO2 97%  General Appearance:    Alert, cooperative, moderate distress, appears stated age  Head:    Normocephalic, without obvious abnormality, atraumatic  Eyes:    PERRL, conjunctiva/corneas clear, EOM's intact, fundi    benign, both eyes, conjunctiva icteric      Ears:    Normal TM's and external ear canals, both ears  Nose:   Nares normal, septum midline, mucosa normal, no drainage    or sinus tenderness  Throat:   Lips, mucosa, and tongue normal; teeth and gums normal  Neck:   Supple, symmetrical, trachea midline, no adenopathy;       thyroid:  No enlargement/tenderness/nodules; no carotid   bruit or JVD  Back:     Symmetric, no curvature, ROM  normal, no CVA tenderness  Lungs:     Clear to auscultation bilaterally, respirations unlabored  Chest wall:    Mild  Tenderness to left chest No deformities noted  Heart:    Regular rate and rhythm, S1 and S2 normal, no murmur, rub   or gallop  Abdomen:     Soft, non-tender, bowel sounds active all four quadrants,    no masses, no organomegaly  Extremities:   Extremities normal, atraumatic, no cyanosis or edema  Pulses:   2+ and symmetric all extremities  Skin:   Skin color, texture, turgor normal, no rashes or lesions  Lymph nodes:   Cervical, supraclavicular, and axillary nodes normal  Neurologic:   CNII-XII intact. Normal strength, sensation and reflexes      throughout    Lab results: Results for orders placed or performed during the hospital encounter of 10/20/14 (from the past 24 hour(s))  Comprehensive metabolic panel     Status: Abnormal   Collection Time: 10/21/14 12:33 AM  Result Value Ref Range   Sodium 137 135 - 145 mmol/L   Potassium 3.7 3.5 - 5.1 mmol/L   Chloride 106 101 - 111 mmol/L   CO2 23 22 - 32 mmol/L   Glucose, Bld 105 (H) 65 - 99 mg/dL   BUN 9 6 - 20 mg/dL   Creatinine, Ser 0.73 0.61 - 1.24 mg/dL   Calcium 8.9 8.9 - 10.3 mg/dL   Total Protein 8.5 (H) 6.5 - 8.1 g/dL   Albumin 4.6 3.5 - 5.0  g/dL   AST 33 15 - 41 U/L   ALT 24 17 - 63 U/L   Alkaline Phosphatase 97 38 - 126 U/L   Total Bilirubin 4.1 (H) 0.3 - 1.2 mg/dL   GFR calc non Af Amer >60 >60 mL/min   GFR calc Af Amer >60 >60 mL/min   Anion gap 8 5 - 15  CBC with Differential     Status: Abnormal   Collection Time: 10/21/14 12:33 AM  Result Value Ref Range   WBC 14.4 (H) 4.0 - 10.5 K/uL   RBC 2.53 (L) 4.22 - 5.81 MIL/uL   Hemoglobin 8.3 (L) 13.0 - 17.0 g/dL   HCT 24.7 (L) 39.0 - 52.0 %   MCV 97.6 78.0 - 100.0 fL   MCH 32.8 26.0 - 34.0 pg   MCHC 33.6 30.0 - 36.0 g/dL   RDW 19.1 (H) 11.5 - 15.5 %   Platelets 611 (H) 150 - 400 K/uL   Neutrophils Relative % 67 %   Neutro Abs 9.8 (H) 1.7 - 7.7 K/uL   Lymphocytes Relative 18 %   Lymphs Abs 2.6 0.7 - 4.0 K/uL   Monocytes Relative 12 %   Monocytes Absolute 1.7 (H) 0.1 - 1.0 K/uL   Eosinophils Relative 2 %   Eosinophils Absolute 0.3 0.0 - 0.7 K/uL   Basophils Relative 1 %   Basophils Absolute 0.1 0.0 - 0.1 K/uL  Reticulocytes     Status: Abnormal   Collection Time: 10/21/14 12:33 AM  Result Value Ref Range   Retic Ct Pct 10.5 (H) 0.4 - 3.1 %   RBC. 2.53 (L) 4.22 - 5.81 MIL/uL   Retic Count, Manual 265.7 (H) 19.0 - 186.0 K/uL   *Note: Due to a large number of results and/or encounters for the requested time period, some results have not been displayed. A complete set of results can be found in Results Review.    Imaging results:  No results found.   Assessment &  Plan: -Admitted to the day infusion center for extended observation -D5.45 % saline at 125 ml/ hour for cellular rehydration -Patient started on Toradol 30 mg IV for inflammation -Started high concentration PCA per weight based protocol for pain control. Will admit to inpatient services if pain intensity remains greater than 7/10 -Monitor vitals closely. Patient will be re-evaluated for pain in the context of function and relationship to baseline as care progresses -Reviewed laboratory values, will  add an LDH  Majd Tissue M 10/21/2014, 9:01 AM

## 2014-10-21 NOTE — Discharge Summary (Signed)
[facility] Discharge Summary   Patient ID: [name] MRN: [id number] DOB/AGE: 1981-01-22 34 y.o.  Admit date: 10/21/2014 Discharge date: 10/21/2014  Primary Care Physician:  [name], MD  Admission Diagnoses:  Active Problems:   Hb-SS disease with crisis    Discharge Medications:    Medication List    ASK your doctor about these medications        aspirin 81 MG chewable tablet  Chew 1 tablet (81 mg total) by mouth daily.     diltiazem 120 MG 24 hr capsule  Commonly known as:  CARDIZEM CD  Take 1 capsule (120 mg total) by mouth daily.     enoxaparin 150 MG/ML injection  Commonly known as:  LOVENOX  Inject 0.73 mLs (110 mg total) into the skin daily.     folic acid 1 MG tablet  Commonly known as:  FOLVITE  Take 1 tablet (1 mg total) by mouth every morning.     HYDROmorphone 4 MG tablet  Commonly known as:  DILAUDID  Take 1 tablet (4 mg total) by mouth every 4 (four) hours as needed for severe pain.     lisinopril 10 MG tablet  Commonly known as:  PRINIVIL,ZESTRIL  Take 1 tablet (10 mg total) by mouth daily.     metoprolol tartrate 25 MG tablet  Commonly known as:  LOPRESSOR  Take 1 tablet (25 mg total) by mouth daily.     morphine 30 MG 12 hr tablet  Commonly known as:  MS CONTIN  Take 1 tablet (30 mg total) by mouth every 12 (twelve) hours.     potassium chloride SA 20 MEQ tablet  Commonly known as:  K-DUR,KLOR-CON  Take 1 tablet (20 mEq total) by mouth every morning.     Vitamin D 2000 UNITS tablet  Take 1 tablet (2,000 Units total) by mouth daily.     zolpidem 10 MG tablet  Commonly known as:  AMBIEN  Take 1 tablet (10 mg total) by mouth at bedtime as needed for sleep.         Consults:  None  Significant Diagnostic Studies:  No results found.   [facility] Course: Patient was admitted to the day infusion center for extended observation.   Reviewed laboratory values from the emergency  department, LDH consistent with baseline. Reviewed CBC, reminded patient of appointment scheduled on 10/6, will discuss referral to hematology during that time.   Started high concentration PCA per weight based protocol, patient used a total of 14.7 mg with 24 demands and 21 deliveries. Pain intensity decreased from 9/10 to 4/10.  Patient alert, oriented, and ambulatory, will discharge home on previously prescribed pain medications. Patient is in stable condition. Patient will discharge home with his brother.   Reminded to follow up in office as scheduled.   Physical Exam at Discharge:  BP 113/86 mmHg  Pulse 96  Temp(Src) 98.4 F (36.9 C) (Oral)  Resp 20  SpO2 97%  General Appearance:   Alert, cooperative, no distress, appears older than stated age  Head:   Normocephalic, without obvious abnormality, atraumatic  Eyes:   PERRL, conjunctiva/corneas clear, EOM's intact, fundi   benign, both eyes, conjunctiva icteric   Ears:   Normal TM's and external ear canals, both ears  Nose:  Nares normal, septum midline, mucosa normal, no drainage  or sinus tenderness  Throat:  Lips, mucosa, and tongue normal; teeth and gums normal  Neck:  Supple, symmetrical, trachea midline, no adenopathy;  thyroid: No enlargement/tenderness/nodules; no carotid  bruit or JVD  Back:   Symmetric, no curvature, ROM normal, no CVA tenderness  Lungs:   Clear to auscultation bilaterally, respirations unlabored  Chest wall:   No tenderness or deformity  Heart:   Regular rate and rhythm, S1 and S2 normal, no murmur, rub  or gallop  Abdomen:   Soft, non-tender, bowel sounds active all four quadrants,   no masses, no organomegaly  Extremities:  Extremities normal, atraumatic, no cyanosis or edema  Pulses:  2+ and symmetric all extremities  Skin:  Skin color, texture, turgor normal, no rashes or lesions  Lymph nodes:  Cervical, supraclavicular, and  axillary nodes normal  Neurologic:  CNII-XII intact. Normal strength, sensation and reflexes   throughout          Disposition at Discharge: 01-Home or Self Care  Discharge Orders:   Condition at Discharge:   Stable  Time spent on Discharge:  25 minutes.  Signed: Laqueena Hinchey M 10/21/2014, 3:10 PM

## 2014-10-21 NOTE — ED Notes (Signed)
Pt complains of rib and back pain since yesterday

## 2014-10-21 NOTE — Discharge Instructions (Signed)
Sickle Cell Anemia, Adult °Sickle cell anemia is a condition in which red blood cells have an abnormal "sickle" shape. This abnormal shape shortens the cells' life span, which results in a lower than normal concentration of red blood cells in the blood. The sickle shape also causes the cells to clump together and block free blood flow through the blood vessels. As a result, the tissues and organs of the body do not receive enough oxygen. Sickle cell anemia causes organ damage and pain and increases the risk of infection. °CAUSES  °Sickle cell anemia is a genetic disorder. Those who receive two copies of the gene have the condition, and those who receive one copy have the trait. °RISK FACTORS °The sickle cell gene is most common in people whose families originated in [place]. Other areas of the globe where sickle cell trait occurs include the [place], [place] and [place], the [place], and the [place].  °SIGNS AND SYMPTOMS °· Pain, especially in the extremities, back, chest, or abdomen (common). The pain may start suddenly or may develop following an illness, especially if there is dehydration. Pain can also occur due to overexertion or exposure to extreme temperature changes. °· Frequent severe bacterial infections, especially certain types of pneumonia and meningitis. °· Pain and swelling in the hands and feet. °· Decreased activity.   °· Loss of appetite.   °· Change in behavior. °· Headaches. °· Seizures. °· Shortness of breath or difficulty breathing. °· Vision changes. °· Skin ulcers. °Those with the trait may not have symptoms or they may have mild symptoms.  °DIAGNOSIS  °Sickle cell anemia is diagnosed with blood tests that demonstrate the genetic trait. It is often diagnosed during the newborn period, due to mandatory testing nationwide. A variety of blood tests, X-rays, CT scans, MRI scans, ultrasounds, and lung function tests may also be done to monitor the condition. °TREATMENT  °Sickle  cell anemia may be treated with: °· Medicines. You may be given pain medicines, antibiotic medicines (to treat and prevent infections) or medicines to increase the production of certain types of hemoglobin. °· Fluids. °· Oxygen. °· Blood transfusions. °HOME CARE INSTRUCTIONS  °· Drink enough fluid to keep your urine clear or pale yellow. Increase your fluid intake in hot weather and during exercise. °· Do not smoke. Smoking lowers oxygen levels in the blood.   °· Only take over-the-counter or prescription medicines for pain, fever, or discomfort as directed by your health care provider. °· Take antibiotics as directed by your health care provider. Make sure you finish them it even if you start to feel better.   °· Take supplements as directed by your health care provider.   °· Consider wearing a medical alert bracelet. This tells anyone caring for you in an emergency of your condition.   °· When traveling, keep your medical information, health care provider's names, and the medicines you take with you at all times.   °· If you develop a fever, do not take medicines to reduce the fever right away. This could cover up a problem that is developing. Notify your health care provider. °· Keep all follow-up appointments with your health care provider. Sickle cell anemia requires regular medical care. °SEEK MEDICAL CARE IF: ° You have a fever. °SEEK IMMEDIATE MEDICAL CARE IF:  °· You feel dizzy or faint.   °· You have new abdominal pain, especially on the left side near the stomach area.   °· You develop a persistent, often uncomfortable and painful penile erection (priapism). If this is not treated immediately it   will lead to impotence.   °· You have numbness your arms or legs or you have a hard time moving them.   °· You have a hard time with speech.   °· You have a fever or persistent symptoms for more than 2-3 days.   °· You have a fever and your symptoms suddenly get worse.   °· You have signs or symptoms of infection.  These include:   °¨ Chills.   °¨ Abnormal tiredness (lethargy).   °¨ Irritability.   °¨ Poor eating.   °¨ Vomiting.   °· You develop pain that is not helped with medicine.   °· You develop shortness of breath. °· You have pain in your chest.   °· You are coughing up pus-like or bloody sputum.   °· You develop a stiff neck. °· Your feet or hands swell or have pain. °· Your abdomen appears bloated. °· You develop joint pain. °MAKE SURE YOU: °· Understand these instructions. °Document Released: 04/27/2005 Document Revised: 06/03/2013 Document Reviewed: 08/29/2012 °[employer]® Patient Information ©2015 [employer]. This information is not intended to replace advice given to you by your health care provider. Make sure you discuss any questions you have with your health care provider. ° °

## 2014-10-22 ENCOUNTER — Telehealth: Payer: Self-pay | Admitting: *Deleted

## 2014-10-22 DIAGNOSIS — G894 Chronic pain syndrome: Secondary | ICD-10-CM

## 2014-10-22 MED ORDER — MORPHINE SULFATE ER 30 MG PO TBCR: 30.0000 mg | EXTENDED_RELEASE_TABLET | Freq: Two times a day (BID) | ORAL | Status: DC

## 2014-10-22 NOTE — Telephone Encounter (Signed)
Pt called and needs his Morphine refilled. Please advise MD. Thanks

## 2014-10-22 NOTE — Telephone Encounter (Signed)
Refill request for Morphine LOV 10/07/2014. Please advise. Thanks!

## 2014-10-22 NOTE — Telephone Encounter (Signed)
Reviewed [place] Substance Reporting system prior to reorder. No inconsistencies noted.   Meds ordered this encounter  Medications  . morphine (MS CONTIN) 30 MG 12 hr tablet    Sig: Take 1 tablet (30 mg total) by mouth every 12 (twelve) hours.    Dispense:  60 tablet    Refill:  0    Rx not to be filled prior to 10/24/2014    Order Specific Question:  Supervising Wagner Tanzi    Answer:  Tresa Garter [[id number]]    Dorena Dew, FNP

## 2014-10-23 ENCOUNTER — Other Ambulatory Visit: Payer: Self-pay | Admitting: Internal Medicine

## 2014-10-23 DIAGNOSIS — G4700 Insomnia, unspecified: Secondary | ICD-10-CM

## 2014-10-23 MED ORDER — ZOLPIDEM TARTRATE 10 MG PO TABS: 10.0000 mg | ORAL_TABLET | Freq: Every evening | ORAL | Status: DC | PRN

## 2014-10-25 ENCOUNTER — Emergency Department (HOSPITAL_COMMUNITY)
Admission: EM | Admit: 2014-10-25 | Discharge: 2014-10-25 | Disposition: A | Discharge status: Home / Self Care | Payer: Medicare Other | Source: Home / Self Care | Attending: Emergency Medicine | Admitting: Emergency Medicine

## 2014-10-25 ENCOUNTER — Encounter (HOSPITAL_COMMUNITY): Payer: Self-pay | Admitting: *Deleted

## 2014-10-25 DIAGNOSIS — Z87891 Personal history of nicotine dependence: Secondary | ICD-10-CM | POA: Diagnosis not present

## 2014-10-25 DIAGNOSIS — D5700 Hb-SS disease with crisis, unspecified: Secondary | ICD-10-CM | POA: Diagnosis not present

## 2014-10-25 DIAGNOSIS — M79669 Pain in unspecified lower leg: Secondary | ICD-10-CM | POA: Diagnosis not present

## 2014-10-25 DIAGNOSIS — Z7901 Long term (current) use of anticoagulants: Secondary | ICD-10-CM

## 2014-10-25 DIAGNOSIS — M79606 Pain in leg, unspecified: Secondary | ICD-10-CM | POA: Diagnosis not present

## 2014-10-25 DIAGNOSIS — Q8901 Asplenia (congenital): Secondary | ICD-10-CM

## 2014-10-25 DIAGNOSIS — Z9114 Patient's other noncompliance with medication regimen: Secondary | ICD-10-CM | POA: Diagnosis present

## 2014-10-25 DIAGNOSIS — Z7982 Long term (current) use of aspirin: Secondary | ICD-10-CM

## 2014-10-25 DIAGNOSIS — Z832 Family history of diseases of the blood and blood-forming organs and certain disorders involving the immune mechanism: Secondary | ICD-10-CM | POA: Diagnosis not present

## 2014-10-25 DIAGNOSIS — D638 Anemia in other chronic diseases classified elsewhere: Secondary | ICD-10-CM | POA: Diagnosis not present

## 2014-10-25 DIAGNOSIS — I10 Essential (primary) hypertension: Secondary | ICD-10-CM | POA: Diagnosis present

## 2014-10-25 DIAGNOSIS — Z9981 Dependence on supplemental oxygen: Secondary | ICD-10-CM | POA: Diagnosis not present

## 2014-10-25 DIAGNOSIS — M545 Low back pain: Secondary | ICD-10-CM | POA: Diagnosis not present

## 2014-10-25 DIAGNOSIS — Z833 Family history of diabetes mellitus: Secondary | ICD-10-CM | POA: Diagnosis not present

## 2014-10-25 DIAGNOSIS — G8929 Other chronic pain: Secondary | ICD-10-CM | POA: Diagnosis present

## 2014-10-25 DIAGNOSIS — R0902 Hypoxemia: Secondary | ICD-10-CM | POA: Diagnosis present

## 2014-10-25 DIAGNOSIS — Z86711 Personal history of pulmonary embolism: Secondary | ICD-10-CM

## 2014-10-25 DIAGNOSIS — Z79891 Long term (current) use of opiate analgesic: Secondary | ICD-10-CM | POA: Diagnosis not present

## 2014-10-25 DIAGNOSIS — Z86718 Personal history of other venous thrombosis and embolism: Secondary | ICD-10-CM

## 2014-10-25 DIAGNOSIS — D72829 Elevated white blood cell count, unspecified: Secondary | ICD-10-CM | POA: Diagnosis present

## 2014-10-25 DIAGNOSIS — F39 Unspecified mood [affective] disorder: Secondary | ICD-10-CM | POA: Diagnosis present

## 2014-10-25 DIAGNOSIS — Z7722 Contact with and (suspected) exposure to environmental tobacco smoke (acute) (chronic): Secondary | ICD-10-CM | POA: Diagnosis present

## 2014-10-25 DIAGNOSIS — Z79899 Other long term (current) drug therapy: Secondary | ICD-10-CM | POA: Diagnosis not present

## 2014-10-25 DIAGNOSIS — Z96641 Presence of right artificial hip joint: Secondary | ICD-10-CM | POA: Diagnosis present

## 2014-10-25 DIAGNOSIS — Z23 Encounter for immunization: Secondary | ICD-10-CM

## 2014-10-25 DIAGNOSIS — I272 Other secondary pulmonary hypertension: Secondary | ICD-10-CM | POA: Diagnosis present

## 2014-10-25 DIAGNOSIS — R079 Chest pain, unspecified: Secondary | ICD-10-CM | POA: Diagnosis not present

## 2014-10-25 LAB — BASIC METABOLIC PANEL
ANION GAP: 9 (ref 5–15)
BUN: 7 mg/dL (ref 6–20)
CALCIUM: 9.1 mg/dL (ref 8.9–10.3)
CHLORIDE: 108 mmol/L (ref 101–111)
CO2: 22 mmol/L (ref 22–32)
Creatinine, Ser: 0.53 mg/dL — ABNORMAL LOW (ref 0.61–1.24)
GFR calc non Af Amer: >60 mL/min (ref >60)
GLUCOSE: 104 mg/dL — AB (ref 65–99)
POTASSIUM: 3.5 mmol/L (ref 3.5–5.1)
Sodium: 139 mmol/L (ref 135–145)

## 2014-10-25 LAB — CBC WITH DIFFERENTIAL/PLATELET
BASOS ABS: 0.1 10*3/uL (ref 0.0–0.1)
BASOS PCT: 1 %
Eosinophils Absolute: 0.2 10*3/uL (ref 0.0–0.7)
Eosinophils Relative: 2 %
HEMATOCRIT: 22.7 % — AB (ref 39.0–52.0)
HEMOGLOBIN: 7.6 g/dL — AB (ref 13.0–17.0)
LYMPHS PCT: 19 %
Lymphs Abs: 3 10*3/uL (ref 0.7–4.0)
MCH: 32.1 pg (ref 26.0–34.0)
MCHC: 33.5 g/dL (ref 30.0–36.0)
MCV: 95.8 fL (ref 78.0–100.0)
MONO ABS: 2.3 10*3/uL — AB (ref 0.1–1.0)
Monocytes Relative: 15 %
NEUTROS ABS: 10 10*3/uL — AB (ref 1.7–7.7)
NEUTROS PCT: 63 %
Platelets: 664 10*3/uL — ABNORMAL HIGH (ref 150–400)
RBC: 2.37 MIL/uL — AB (ref 4.22–5.81)
RDW: 19.2 % — ABNORMAL HIGH (ref 11.5–15.5)
WBC: 15.6 10*3/uL — AB (ref 4.0–10.5)

## 2014-10-25 LAB — RETICULOCYTES
RBC.: 2.37 MIL/uL — AB (ref 4.22–5.81)
RETIC COUNT ABSOLUTE: 279.7 10*3/uL — AB (ref 19.0–186.0)
Retic Ct Pct: 11.8 % — ABNORMAL HIGH (ref 0.4–3.1)

## 2014-10-25 MED ORDER — HEPARIN SOD (PORK) LOCK FLUSH 100 UNIT/ML IV SOLN
500.0000 [IU] | Freq: Once | INTRAVENOUS | Status: AC
  Administered 2014-10-25: 500 [IU]
  Filled 2014-10-25: qty 5

## 2014-10-25 MED ORDER — SODIUM CHLORIDE 0.9 % IV BOLUS (SEPSIS)
1000.0000 mL | Freq: Once | INTRAVENOUS | Status: AC
  Administered 2014-10-25: 1000 mL via INTRAVENOUS

## 2014-10-25 MED ORDER — HYDROMORPHONE HCL 2 MG/ML IJ SOLN
2.0000 mg | Freq: Once | INTRAMUSCULAR | Status: AC
  Administered 2014-10-25: 2 mg via INTRAVENOUS
  Filled 2014-10-25: qty 1

## 2014-10-25 MED ORDER — DIPHENHYDRAMINE HCL 50 MG/ML IJ SOLN
25.0000 mg | Freq: Once | INTRAMUSCULAR | Status: AC
  Administered 2014-10-25: 25 mg via INTRAVENOUS
  Filled 2014-10-25: qty 1

## 2014-10-25 MED ORDER — ONDANSETRON HCL 4 MG/2ML IJ SOLN
4.0000 mg | Freq: Once | INTRAMUSCULAR | Status: AC
  Administered 2014-10-25: 4 mg via INTRAVENOUS
  Filled 2014-10-25: qty 2

## 2014-10-25 MED ORDER — KETOROLAC TROMETHAMINE 30 MG/ML IJ SOLN
30.0000 mg | Freq: Once | INTRAMUSCULAR | Status: AC
  Administered 2014-10-25: 30 mg via INTRAVENOUS
  Filled 2014-10-25 (×2): qty 1

## 2014-10-25 NOTE — Discharge Instructions (Signed)
1. Medications: usual home medications 2. Treatment: rest, drink plenty of fluids 3. Follow Up: please followup with your primary doctor for discussion of your diagnoses and further evaluation after today's visit; please return to the ER for high fever, chest pain, shortness of breath, new or worsening symptoms   Sickle Cell Anemia Sickle cell anemia is a condition where your red blood cells are shaped like sickles. Red blood cells carry oxygen through the body. Sickle-shaped red blood cells do not live as long as normal red blood cells. They also clump together and block blood from flowing through the blood vessels. These things prevent the body from getting enough oxygen. Sickle cell anemia causes organ damage and pain. It also increases the risk of infection. HOME CARE  Drink enough fluid to keep your pee (urine) clear or pale yellow. Drink more in hot weather and during exercise.  Do not smoke. Smoking lowers oxygen levels in the blood.  Only take over-the-counter or prescription medicines as told by your doctor.  Take antibiotic medicines as told by your doctor. Make sure you finish them even if you start to feel better.  Take supplements as told by your doctor.  Consider wearing a medical alert bracelet. This tells anyone caring for you in an emergency of your condition.  When traveling, keep your medical information, doctors' names, and the medicines you take with you at all times.  If you have a fever, do not take fever medicines right away. This could cover up a problem. Tell your doctor.   Keep all follow-up visits with your doctor. Sickle cell anemia requires regular medical care. GET HELP IF: You have a fever. GET HELP RIGHT AWAY IF:  You feel dizzy or faint.  You have new belly (abdominal) pain, especially on the left side near the stomach area.  You have a lasting, often uncomfortable and painful erection of the penis (priapism). If it is not treated right away, you  will become unable to have sex (impotence).  You have numbness in your arms or legs or you have a hard time moving them.  You have a hard time talking.  You have a fever or lasting symptoms for more than 2-3 days.  You have a fever and your symptoms suddenly get worse.  You have signs or symptoms of infection. These include:  Chills.  Being more tired than normal (lethargy).  Irritability.  Poor eating.  Throwing up (vomiting).  You have pain that is not helped with medicine.  You have shortness of breath.  You have pain in your chest.  You are coughing up pus-like or bloody mucus.  You have a stiff neck.  Your feet or hands swell or have pain.  Your belly looks bloated.  Your joints hurt. MAKE SURE YOU:  Understand these instructions.  Will watch your condition.  Will get help right away if you are not doing well or get worse. Document Released: 11/07/2012 Document Revised: 06/03/2013 Document Reviewed: 11/07/2012 [facility] Patient Information 2015 [place], [place]. This information is not intended to replace advice given to you by your health care provider. Make sure you discuss any questions you have with your health care provider.

## 2014-10-25 NOTE — ED Provider Notes (Signed)
CSN: [id number]     Arrival date & time 10/25/14  0216 History   First MD Initiated Contact with Patient 10/25/14 [phone]     Chief Complaint  Patient presents with  . Sickle Cell Pain Crisis     (Consider location/radiation/quality/duration/timing/severity/associated sxs/prior Treatment) Patient is a 34 y.o. male presenting with sickle cell pain. The history is provided by the patient. No language interpreter was used.  Sickle Cell Pain Crisis Location:  Lower extremity and L side Severity:  Severe Onset quality:  Gradual Duration:  3 hours Similar to previous crisis episodes: yes   Timing:  Constant Progression:  Worsening Chronicity:  Recurrent Sickle cell genotype:  SS History of pulmonary emboli: yes   Context: not alcohol consumption, not change in medication, not cold exposure, not dehydration, not infection, not non-compliance, not low humidity, not menses, not pregnancy and not stress   Relieved by:  Nothing Worsened by:  Nothing tried Ineffective treatments:  Prescription drugs Associated symptoms: no chest pain, no congestion, no cough, no fatigue, no headaches, no priapism, no sore throat, no swelling of legs and no vision change   Risk factors: frequent admissions for pain and prior acute chest   Risk factors: no cholecystectomy, no exertion, no frequent admissions for fever, no frequent pain crises, no hx of pneumonia, no hx of stroke, no recent air travel and not smoking     Past Medical History  Diagnosis Date  . Sickle cell anemia   . Blood transfusion   . Acute embolism and thrombosis of right internal jugular vein   . Hypokalemia   . Mood disorder   . History of pulmonary embolus (PE)   . Avascular necrosis   . Leukocytosis     Chronic  . Thrombocytosis     Chronic  . Hypertension   . History of Clostridium difficile infection   . Uses marijuana   . Chronic anticoagulation   . Functional asplenia   . Former smoker   . Second hand tobacco smoke  exposure   . Alcohol consumption of one to four drinks per day   . Noncompliance with medication regimen   . Sickle-cell crisis with associated acute chest syndrome 05/13/2013  . Acute chest syndrome 06/18/2013  . Demand ischemia 01/02/2014   Past Surgical History  Procedure Laterality Date  . Right hip replacement      08/2006  . Cholecystectomy      01/2008  . Porta cath placement    . Porta cath removal    . Umbilical hernia repair      01/2008  . Excision of left periauricular cyst      10/2009  . Excision of right ear lobe cyst with primary closur      11/2007  . Portacath placement  01/05/2012    Procedure: INSERTION PORT-A-CATH;  Surgeon: Odis Hollingshead, MD;  Location: [facility];  Service: General;  Laterality: N/A;  ultrasound guiced port a cath insertion with fluoroscopy   Family History  Problem Relation Age of Onset  . Sickle cell trait Mother   . Depression Mother   . Diabetes Mother   . Sickle cell trait Father   . Sickle cell trait Brother    Social History  Substance Use Topics  . Smoking status: Former Smoker -- 13 years    Quit date: 07/08/2010  . Smokeless tobacco: Never Used  . Alcohol Use: No    Review of Systems  Constitutional: Negative for fatigue.  HENT: Negative for congestion  and sore throat.   Respiratory: Negative for cough.   Cardiovascular: Negative for chest pain.  Musculoskeletal: Positive for myalgias.  Neurological: Negative for headaches.  All other systems reviewed and are negative.     Allergies  Review of patient's allergies indicates no known allergies.  Home Medications   Prior to Admission medications   Medication Sig Start Date End Date Taking? Authorizing Provider  Cholecalciferol (VITAMIN D) 2000 UNITS tablet Take 1 tablet (2,000 Units total) by mouth daily. 02/13/14  Yes [name], MD  diltiazem (CARDIZEM CD) 120 MG 24 hr capsule Take 1 capsule (120 mg total) by mouth daily. 03/13/14  Yes Costin Karlyne Greenspan, MD   enoxaparin (LOVENOX) 150 MG/ML injection Inject 0.73 mLs (110 mg total) into the skin daily. 09/26/14  Yes Micheline Chapman, NP  folic acid (FOLVITE) 1 MG tablet Take 1 tablet (1 mg total) by mouth every morning. 10/08/13  Yes [name], MD  HYDROmorphone (DILAUDID) 4 MG tablet Take 1 tablet (4 mg total) by mouth every 4 (four) hours as needed for severe pain. 10/07/14  Yes Tresa Garter, MD  lisinopril (PRINIVIL,ZESTRIL) 10 MG tablet Take 1 tablet (10 mg total) by mouth daily. 01/22/14  Yes [name], MD  metoprolol tartrate (LOPRESSOR) 25 MG tablet Take 1 tablet (25 mg total) by mouth daily. 08/22/13  Yes [name], MD  morphine (MS CONTIN) 30 MG 12 hr tablet Take 1 tablet (30 mg total) by mouth every 12 (twelve) hours. 10/22/14  Yes Dorena Dew, FNP  potassium chloride SA (K-DUR,KLOR-CON) 20 MEQ tablet Take 1 tablet (20 mEq total) by mouth every morning. 06/09/14  Yes [name], MD  zolpidem (AMBIEN) 10 MG tablet Take 1 tablet (10 mg total) by mouth at bedtime as needed for sleep. 10/23/14  Yes Tresa Garter, MD  aspirin 81 MG chewable tablet Chew 1 tablet (81 mg total) by mouth daily. 07/23/14   [name], MD   BP 122/78 mmHg  Pulse 90  Temp(Src) 98.4 F (36.9 C) (Oral)  Resp 18  SpO2 100% Physical Exam  Constitutional: He is oriented to person, place, and time. He appears well-developed and well-nourished. No distress.  HENT:  Head: Normocephalic and atraumatic.  Eyes: Conjunctivae and EOM are normal.  Neck: Normal range of motion.  Cardiovascular: Normal rate and regular rhythm.  Exam reveals no gallop and no friction rub.   No murmur heard. Pulmonary/Chest: Effort normal and breath sounds normal. He has no wheezes. He has no rales. He exhibits no tenderness.  Abdominal: Soft. He exhibits no distension. There is no tenderness.  Musculoskeletal: Normal range of motion.  Neurological: He is alert and oriented to person, place,  and time. Coordination normal.  Speech is goal-oriented. Moves limbs without ataxia.   Skin: Skin is warm and dry.  Psychiatric: He has a normal mood and affect. His behavior is normal.  Nursing note and vitals reviewed.   ED Course  Procedures (including critical care time) Labs Review Labs Reviewed  CBC WITH DIFFERENTIAL/PLATELET - Abnormal; Notable for the following:    WBC 15.6 (*)    RBC 2.37 (*)    Hemoglobin 7.6 (*)    HCT 22.7 (*)    RDW 19.2 (*)    Platelets 664 (*)    Neutro Abs 10.0 (*)    Monocytes Absolute 2.3 (*)    All other components within normal limits  BASIC METABOLIC PANEL - Abnormal; Notable for the  following:    Glucose, Bld 104 (*)    Creatinine, Ser 0.53 (*)    All other components within normal limits  RETICULOCYTES - Abnormal; Notable for the following:    Retic Ct Pct 11.8 (*)    RBC. 2.37 (*)    Retic Count, Manual 279.7 (*)    All other components within normal limits    Imaging Review No results found. I have personally reviewed and evaluated these images and lab results as part of my medical decision-making.   EKG Interpretation None      MDM   Final diagnoses:  Sickle cell pain crisis    4:59 AM Patient's labs pending. Patient will have dilaudid for pain. Vitals stable and patient afebrile.   Patient signed out to Lenell Antu, PA-C for recheck.   Alvina Chou, PA-C 10/26/14 0623  Alvina Chou, PA-C 10/26/14 [id number]  Virgel Manifold, MD 10/27/14 [phone]

## 2014-10-25 NOTE — ED Notes (Signed)
PA at bedside.

## 2014-10-25 NOTE — ED Provider Notes (Signed)
6:02 AM   Patient signed out to me by Alvina Chou, PA-C.  Patient presents with sickle cell pain crisis, complains of pain in bilateral lower extremities and left ribs.  Hemoglobin 7.6, retic 11.8. Patient given dilaudid x 2. Plan to give additional dose of pain medication; without improvement, patient to be admitted.  Given 3rd dose of dilaudid. Patient reports pain is back to baseline and states he feels like he can manage his pain at home. Patient stable for discharge at this time. Return precautions discussed. Patient to follow-up with PCP.   BP 120/74 mmHg  Pulse 88  Temp(Src) 98.4 F (36.9 C) (Oral)  Resp 18  SpO2 92%   Marella Chimes, PA-C 10/25/14 [place], MD 10/27/14 [phone]

## 2014-10-25 NOTE — ED Notes (Signed)
Pt states that he began to have bilateral side pain and bilateral leg pain a couple of hours ago; pt states that this is typical pain for his sickle cell crisis

## 2014-10-26 ENCOUNTER — Emergency Department (HOSPITAL_COMMUNITY): Payer: Medicare Other

## 2014-10-26 ENCOUNTER — Inpatient Hospital Stay (HOSPITAL_COMMUNITY)
Admission: EM | Admit: 2014-10-26 | Discharge: 2014-10-30 | DRG: 812 | Disposition: A | Discharge status: Home / Self Care | Payer: Medicare Other | Attending: Internal Medicine | Admitting: Internal Medicine

## 2014-10-26 ENCOUNTER — Encounter (HOSPITAL_COMMUNITY): Payer: Self-pay | Admitting: Oncology

## 2014-10-26 DIAGNOSIS — R079 Chest pain, unspecified: Secondary | ICD-10-CM | POA: Diagnosis present

## 2014-10-26 DIAGNOSIS — D638 Anemia in other chronic diseases classified elsewhere: Secondary | ICD-10-CM | POA: Diagnosis present

## 2014-10-26 DIAGNOSIS — D5700 Hb-SS disease with crisis, unspecified: Secondary | ICD-10-CM | POA: Diagnosis present

## 2014-10-26 DIAGNOSIS — Z7901 Long term (current) use of anticoagulants: Secondary | ICD-10-CM

## 2014-10-26 LAB — COMPREHENSIVE METABOLIC PANEL
ALT: 29 U/L (ref 17–63)
AST: 46 U/L — AB (ref 15–41)
Albumin: 4 g/dL (ref 3.5–5.0)
Alkaline Phosphatase: 85 U/L (ref 38–126)
Anion gap: 10 (ref 5–15)
BUN: 11 mg/dL (ref 6–20)
CHLORIDE: 107 mmol/L (ref 101–111)
CO2: 20 mmol/L — AB (ref 22–32)
CREATININE: 0.74 mg/dL (ref 0.61–1.24)
Calcium: 8.3 mg/dL — ABNORMAL LOW (ref 8.9–10.3)
GFR calc Af Amer: >60 mL/min (ref >60)
GFR calc non Af Amer: >60 mL/min (ref >60)
Glucose, Bld: 114 mg/dL — ABNORMAL HIGH (ref 65–99)
Potassium: 3.6 mmol/L (ref 3.5–5.1)
SODIUM: 137 mmol/L (ref 135–145)
Total Bilirubin: 6.1 mg/dL — ABNORMAL HIGH (ref 0.3–1.2)
Total Protein: 7.1 g/dL (ref 6.5–8.1)

## 2014-10-26 LAB — RETICULOCYTES
RBC.: 2.03 MIL/uL — ABNORMAL LOW (ref 4.22–5.81)
RBC.: 2.13 MIL/uL — ABNORMAL LOW (ref 4.22–5.81)
RETIC COUNT ABSOLUTE: 321.6 10*3/uL — AB (ref 19.0–186.0)
RETIC CT PCT: 13.1 % — AB (ref 0.4–3.1)
Retic Count, Absolute: 265.9 10*3/uL — ABNORMAL HIGH (ref 19.0–186.0)
Retic Ct Pct: 15.1 % — ABNORMAL HIGH (ref 0.4–3.1)

## 2014-10-26 LAB — CBC WITH DIFFERENTIAL/PLATELET
Basophils Absolute: 0.2 10*3/uL — ABNORMAL HIGH (ref 0.0–0.1)
Basophils Relative: 1 %
EOS ABS: 0.3 10*3/uL (ref 0.0–0.7)
EOS PCT: 2 %
HCT: 19.4 % — ABNORMAL LOW (ref 39.0–52.0)
HEMOGLOBIN: 6.6 g/dL — AB (ref 13.0–17.0)
LYMPHS ABS: 3.6 10*3/uL (ref 0.7–4.0)
Lymphocytes Relative: 21 %
MCH: 32.2 pg (ref 26.0–34.0)
MCHC: 34 g/dL (ref 30.0–36.0)
MCV: 94.6 fL (ref 78.0–100.0)
MONOS PCT: 13 %
Monocytes Absolute: 2.3 10*3/uL — ABNORMAL HIGH (ref 0.1–1.0)
NEUTROS PCT: 64 %
Neutro Abs: 11.1 10*3/uL — ABNORMAL HIGH (ref 1.7–7.7)
Platelets: 569 10*3/uL — ABNORMAL HIGH (ref 150–400)
RBC: 2.05 MIL/uL — ABNORMAL LOW (ref 4.22–5.81)
RDW: 20.6 % — ABNORMAL HIGH (ref 11.5–15.5)
WBC: 17.4 10*3/uL — ABNORMAL HIGH (ref 4.0–10.5)

## 2014-10-26 LAB — MAGNESIUM: Magnesium: 1.7 mg/dL (ref 1.7–2.4)

## 2014-10-26 LAB — LACTATE DEHYDROGENASE: LDH: 367 U/L — ABNORMAL HIGH (ref 98–192)

## 2014-10-26 MED ORDER — INFLUENZA VAC SPLIT QUAD 0.5 ML IM SUSY
0.5000 mL | PREFILLED_SYRINGE | INTRAMUSCULAR | Status: AC
  Administered 2014-10-27: 0.5 mL via INTRAMUSCULAR
  Filled 2014-10-26 (×2): qty 0.5

## 2014-10-26 MED ORDER — DEXTROSE-NACL 5-0.45 % IV SOLN
INTRAVENOUS | Status: DC
  Administered 2014-10-26 – 2014-10-27 (×3): via INTRAVENOUS

## 2014-10-26 MED ORDER — DIPHENHYDRAMINE HCL 12.5 MG/5ML PO ELIX
12.5000 mg | ORAL_SOLUTION | Freq: Four times a day (QID) | ORAL | Status: DC | PRN
  Filled 2014-10-26: qty 5

## 2014-10-26 MED ORDER — DIPHENHYDRAMINE HCL 50 MG/ML IJ SOLN
25.0000 mg | Freq: Once | INTRAMUSCULAR | Status: AC
  Administered 2014-10-26: 25 mg via INTRAVENOUS
  Filled 2014-10-26: qty 1

## 2014-10-26 MED ORDER — HYDROMORPHONE HCL 2 MG/ML IJ SOLN
2.0000 mg | Freq: Once | INTRAMUSCULAR | Status: AC
  Administered 2014-10-26: 2 mg via INTRAVENOUS
  Filled 2014-10-26: qty 1

## 2014-10-26 MED ORDER — ENOXAPARIN SODIUM 120 MG/0.8ML ~~LOC~~ SOLN
110.0000 mg | SUBCUTANEOUS | Status: DC
  Administered 2014-10-26 – 2014-10-29 (×4): 110 mg via SUBCUTANEOUS
  Filled 2014-10-26: qty 1
  Filled 2014-10-26: qty 0.8
  Filled 2014-10-26 (×2): qty 1
  Filled 2014-10-26 (×3): qty 0.8
  Filled 2014-10-26 (×2): qty 1

## 2014-10-26 MED ORDER — CEFTRIAXONE SODIUM 1 G IJ SOLR
1.0000 g | Freq: Once | INTRAMUSCULAR | Status: AC
  Administered 2014-10-26: 1 g via INTRAVENOUS
  Filled 2014-10-26: qty 10

## 2014-10-26 MED ORDER — HYDROMORPHONE 2 MG/ML HIGH CONCENTRATION IV PCA SOLN
INTRAVENOUS | Status: DC
  Administered 2014-10-26: 4.8 mg via INTRAVENOUS
  Administered 2014-10-26: 2.4 mg via INTRAVENOUS
  Administered 2014-10-26: 13:00:00 via INTRAVENOUS
  Administered 2014-10-27: 0.6 mg via INTRAVENOUS
  Administered 2014-10-27: 2.4 mg via INTRAVENOUS
  Administered 2014-10-27: 6.6 mg via INTRAVENOUS
  Administered 2014-10-27: 9.6 mg via INTRAVENOUS
  Administered 2014-10-27: 3 mg via INTRAVENOUS
  Administered 2014-10-27: 8.2 mg via INTRAVENOUS
  Administered 2014-10-28: 14.4 mg via INTRAVENOUS
  Administered 2014-10-28: 3.6 mg via INTRAVENOUS
  Administered 2014-10-28: 7.8 mg via INTRAVENOUS
  Administered 2014-10-29: 3 mg via INTRAVENOUS
  Administered 2014-10-29: 1.2 mg via INTRAVENOUS
  Filled 2014-10-26: qty 25

## 2014-10-26 MED ORDER — SODIUM CHLORIDE 0.9 % IJ SOLN: 9.0000 mL | INTRAMUSCULAR | Status: DC | PRN

## 2014-10-26 MED ORDER — SODIUM CHLORIDE 0.9 % IV SOLN
12.5000 mg | Freq: Four times a day (QID) | INTRAVENOUS | Status: DC | PRN
  Administered 2014-10-26 – 2014-10-30 (×9): 12.5 mg via INTRAVENOUS
  Filled 2014-10-26 (×18): qty 0.25

## 2014-10-26 MED ORDER — FOLIC ACID 1 MG PO TABS
1.0000 mg | ORAL_TABLET | Freq: Every morning | ORAL | Status: DC
  Administered 2014-10-26 – 2014-10-30 (×5): 1 mg via ORAL
  Filled 2014-10-26 (×5): qty 1

## 2014-10-26 MED ORDER — VITAMIN D 1000 UNITS PO TABS
2000.0000 [IU] | ORAL_TABLET | Freq: Every day | ORAL | Status: DC
  Administered 2014-10-26 – 2014-10-30 (×5): 2000 [IU] via ORAL
  Filled 2014-10-26 (×5): qty 2

## 2014-10-26 MED ORDER — LISINOPRIL 10 MG PO TABS
10.0000 mg | ORAL_TABLET | Freq: Every day | ORAL | Status: DC
  Administered 2014-10-26 – 2014-10-30 (×5): 10 mg via ORAL
  Filled 2014-10-26 (×5): qty 1

## 2014-10-26 MED ORDER — SENNOSIDES-DOCUSATE SODIUM 8.6-50 MG PO TABS
1.0000 | ORAL_TABLET | Freq: Two times a day (BID) | ORAL | Status: DC
Start: 2014-10-26 — End: 2014-10-30
  Administered 2014-10-26 – 2014-10-30 (×8): 1 via ORAL
  Filled 2014-10-26 (×8): qty 1

## 2014-10-26 MED ORDER — POTASSIUM CHLORIDE CRYS ER 20 MEQ PO TBCR
20.0000 meq | EXTENDED_RELEASE_TABLET | Freq: Every morning | ORAL | Status: DC
  Administered 2014-10-26 – 2014-10-30 (×5): 20 meq via ORAL
  Filled 2014-10-26 (×5): qty 1

## 2014-10-26 MED ORDER — ASPIRIN 81 MG PO CHEW
81.0000 mg | CHEWABLE_TABLET | Freq: Every day | ORAL | Status: DC
  Administered 2014-10-26 – 2014-10-30 (×5): 81 mg via ORAL
  Filled 2014-10-26 (×5): qty 1

## 2014-10-26 MED ORDER — DEXTROSE 5 % IV SOLN
500.0000 mg | Freq: Once | INTRAVENOUS | Status: AC
  Administered 2014-10-26: 500 mg via INTRAVENOUS
  Filled 2014-10-26: qty 500

## 2014-10-26 MED ORDER — SODIUM CHLORIDE 0.9 % IV BOLUS (SEPSIS)
1000.0000 mL | Freq: Once | INTRAVENOUS | Status: AC
  Administered 2014-10-26: 1000 mL via INTRAVENOUS

## 2014-10-26 MED ORDER — VITAMIN D 50 MCG (2000 UT) PO TABS
2000.0000 [IU] | ORAL_TABLET | Freq: Every day | ORAL | Status: DC
Start: 2014-10-26 — End: 2014-10-26

## 2014-10-26 MED ORDER — KETOROLAC TROMETHAMINE 30 MG/ML IJ SOLN
30.0000 mg | Freq: Four times a day (QID) | INTRAMUSCULAR | Status: DC
Start: 2014-10-26 — End: 2014-10-30
  Administered 2014-10-26 – 2014-10-30 (×17): 30 mg via INTRAVENOUS
  Filled 2014-10-26 (×17): qty 1

## 2014-10-26 MED ORDER — IOHEXOL 350 MG/ML SOLN
100.0000 mL | Freq: Once | INTRAVENOUS | Status: AC | PRN
Start: 2014-10-26 — End: 2014-10-26
  Administered 2014-10-26: 100 mL via INTRAVENOUS

## 2014-10-26 MED ORDER — DILTIAZEM HCL ER COATED BEADS 120 MG PO CP24
120.0000 mg | ORAL_CAPSULE | Freq: Every day | ORAL | Status: DC
  Administered 2014-10-26 – 2014-10-30 (×5): 120 mg via ORAL
  Filled 2014-10-26 (×5): qty 1

## 2014-10-26 MED ORDER — NALOXONE HCL 0.4 MG/ML IJ SOLN: 0.4000 mg | INTRAMUSCULAR | Status: DC | PRN

## 2014-10-26 MED ORDER — METOPROLOL TARTRATE 25 MG PO TABS
25.0000 mg | ORAL_TABLET | Freq: Every day | ORAL | Status: DC
  Administered 2014-10-26 – 2014-10-30 (×5): 25 mg via ORAL
  Filled 2014-10-26 (×5): qty 1

## 2014-10-26 MED ORDER — MORPHINE SULFATE ER 30 MG PO TBCR
30.0000 mg | EXTENDED_RELEASE_TABLET | Freq: Two times a day (BID) | ORAL | Status: DC
  Administered 2014-10-26 – 2014-10-30 (×9): 30 mg via ORAL
  Filled 2014-10-26 (×9): qty 1

## 2014-10-26 MED ORDER — ONDANSETRON HCL 4 MG/2ML IJ SOLN: 4.0000 mg | Freq: Four times a day (QID) | INTRAMUSCULAR | Status: DC | PRN

## 2014-10-26 MED ORDER — ZOLPIDEM TARTRATE 10 MG PO TABS
10.0000 mg | ORAL_TABLET | Freq: Every evening | ORAL | Status: DC | PRN
  Administered 2014-10-27 – 2014-10-29 (×4): 10 mg via ORAL
  Filled 2014-10-26 (×4): qty 1

## 2014-10-26 MED ORDER — POLYETHYLENE GLYCOL 3350 17 G PO PACK: 17.0000 g | PACK | Freq: Every day | ORAL | Status: DC | PRN

## 2014-10-26 NOTE — ED Notes (Signed)
Pt requesting to see the doctor. Pt made aware that the doctor has signed up for him that he is just waiting for the doctor to see him.

## 2014-10-26 NOTE — ED Notes (Signed)
Lab called to report a critical hemoglobin of 6.6.

## 2014-10-26 NOTE — ED Notes (Signed)
Pt's O2 noted to be 88% on RA. 2L O2 via [place] w/ an improvement in O2 to 93%.

## 2014-10-26 NOTE — ED Notes (Signed)
Pt presents d/t chronic sickle cell pain.  Pt reports pain in lower back and b/l legs.

## 2014-10-26 NOTE — Progress Notes (Signed)
Utilization review completed.  

## 2014-10-26 NOTE — ED Provider Notes (Signed)
CSN: [id number]     Arrival date & time 10/26/14  0515 History   First MD Initiated Contact with Patient 10/26/14 0602     Chief Complaint  Patient presents with  . Sickle Cell Pain Crisis     (Consider location/radiation/quality/duration/timing/severity/associated sxs/prior Treatment) HPI Comments: Pt is a 34 yo male who presents to the ED with complaint of sickle cell crisis. Pt reports he was seen at the ED yesterday, pain improved and he was d/c home. He reports his pain was at baseline until yesterday evening when his pain worsened. Endorses low back pain, leg pain and lower rib pain. Denies fever, chills, HA, cough, SOB, CP, abdominal pain, N/V/D, urinary sxs, priapism, swelling of legs, weakness. He reports he has been taking his home pain meds (4mg  Dilaudid) as prescribed without relief. Pt seen by Dr. Zigmund Daniel. Last admission for sickle cell crisis was 10/16/14.   Past Medical History  Diagnosis Date  . Sickle cell anemia   . Blood transfusion   . Acute embolism and thrombosis of right internal jugular vein   . Hypokalemia   . Mood disorder   . History of pulmonary embolus (PE)   . Avascular necrosis   . Leukocytosis     Chronic  . Thrombocytosis     Chronic  . Hypertension   . History of Clostridium difficile infection   . Uses marijuana   . Chronic anticoagulation   . Functional asplenia   . Former smoker   . Second hand tobacco smoke exposure   . Alcohol consumption of one to four drinks per day   . Noncompliance with medication regimen   . Sickle-cell crisis with associated acute chest syndrome 05/13/2013  . Acute chest syndrome 06/18/2013  . Demand ischemia 01/02/2014   Past Surgical History  Procedure Laterality Date  . Right hip replacement      08/2006  . Cholecystectomy      01/2008  . Porta cath placement    . Porta cath removal    . Umbilical hernia repair      01/2008  . Excision of left periauricular cyst      10/2009  . Excision of right ear lobe  cyst with primary closur      11/2007  . Portacath placement  01/05/2012    Procedure: INSERTION PORT-A-CATH;  Surgeon: Odis Hollingshead, MD;  Location: [facility];  Service: General;  Laterality: N/A;  ultrasound guiced port a cath insertion with fluoroscopy   Family History  Problem Relation Age of Onset  . Sickle cell trait Mother   . Depression Mother   . Diabetes Mother   . Sickle cell trait Father   . Sickle cell trait Brother    Social History  Substance Use Topics  . Smoking status: Former Smoker -- 13 years    Quit date: 07/08/2010  . Smokeless tobacco: Never Used  . Alcohol Use: No    Review of Systems  Musculoskeletal: Positive for back pain.       Leg pain  All other systems reviewed and are negative.     Allergies  Review of patient's allergies indicates no known allergies.  Home Medications   Prior to Admission medications   Medication Sig Start Date End Date Taking? Authorizing Provider  aspirin 81 MG chewable tablet Chew 1 tablet (81 mg total) by mouth daily. 07/23/14  Yes [name], MD  Cholecalciferol (VITAMIN D) 2000 UNITS tablet Take 1 tablet (2,000 Units total) by mouth daily. 02/13/14  Yes [name], MD  diltiazem (CARDIZEM CD) 120 MG 24 hr capsule Take 1 capsule (120 mg total) by mouth daily. 03/13/14  Yes Costin Karlyne Greenspan, MD  enoxaparin (LOVENOX) 150 MG/ML injection Inject 0.73 mLs (110 mg total) into the skin daily. 09/26/14  Yes Micheline Chapman, NP  folic acid (FOLVITE) 1 MG tablet Take 1 tablet (1 mg total) by mouth every morning. 10/08/13  Yes [name], MD  HYDROmorphone (DILAUDID) 4 MG tablet Take 1 tablet (4 mg total) by mouth every 4 (four) hours as needed for severe pain. 10/07/14  Yes Tresa Garter, MD  lisinopril (PRINIVIL,ZESTRIL) 10 MG tablet Take 1 tablet (10 mg total) by mouth daily. 01/22/14  Yes [name], MD  metoprolol tartrate (LOPRESSOR) 25 MG tablet Take 1 tablet (25 mg total) by mouth daily.  08/22/13  Yes [name], MD  morphine (MS CONTIN) 30 MG 12 hr tablet Take 1 tablet (30 mg total) by mouth every 12 (twelve) hours. 10/22/14  Yes Dorena Dew, FNP  potassium chloride SA (K-DUR,KLOR-CON) 20 MEQ tablet Take 1 tablet (20 mEq total) by mouth every morning. 06/09/14  Yes [name], MD  zolpidem (AMBIEN) 10 MG tablet Take 1 tablet (10 mg total) by mouth at bedtime as needed for sleep. 10/23/14  Yes Olugbemiga E Jegede, MD   BP 121/87 mmHg  Pulse 107  Temp(Src) 98.8 F (37.1 C) (Oral)  Resp 22  SpO2 92% Physical Exam  Constitutional: He is oriented to person, place, and time. He appears well-developed and well-nourished.  HENT:  Head: Normocephalic and atraumatic.  Mouth/Throat: Oropharynx is clear and moist.  Eyes: Conjunctivae and EOM are normal. Right eye exhibits no discharge. Left eye exhibits no discharge. No scleral icterus.  Neck: Normal range of motion. Neck supple.  Cardiovascular: Normal rate, regular rhythm, normal heart sounds and intact distal pulses.   Pulmonary/Chest: Effort normal and breath sounds normal. No respiratory distress. He has no wheezes. He has no rales. He exhibits no tenderness.  Abdominal: Soft. Bowel sounds are normal. He exhibits no distension and no mass. There is no tenderness. There is no rebound and no guarding.  Musculoskeletal: Normal range of motion. He exhibits no edema or tenderness.  Lymphadenopathy:    He has no cervical adenopathy.  Neurological: He is alert and oriented to person, place, and time.  Skin: Skin is warm and dry.  Nursing note and vitals reviewed.   ED Course  Procedures (including critical care time) Labs Review Labs Reviewed  CBC WITH DIFFERENTIAL/PLATELET - Abnormal; Notable for the following:    WBC 17.4 (*)    RBC 2.05 (*)    Hemoglobin 6.6 (*)    HCT 19.4 (*)    RDW 20.6 (*)    Platelets 569 (*)    Neutro Abs 11.1 (*)    Monocytes Absolute 2.3 (*)    Basophils Absolute 0.2 (*)     All other components within normal limits  COMPREHENSIVE METABOLIC PANEL  RETICULOCYTES    Imaging Review No results found. I have personally reviewed and evaluated these images and lab results as part of my medical decision-making.  Filed Vitals:   10/26/14 0800  BP: 130/89  Pulse: 97  Temp:   Resp: 16     MDM   Final diagnoses:  Sickle cell pain crisis    Pt presents with sickle cell pain crisis. Pt was seen in the ED yesterday for similar pain, he reports his  pain improved, was discharged home and then started to have worsening pain that started last night. No relief with home pain meds. Endorses lower rib pain. O2 at 88%, pt placed on 2L [place], O2 improved to  95%. Exam showed no TTP, no respiratory distress, lungs CTAB. Pt given IVF and 2mg  dilaudid.  Hgb 6.6. Retic 13.1. CXR showed right lower lobe opacity. Started zithromax and rocephin for ACS. Consulted hospitalist, Dr. Jonelle Sidle recommended CT angio due to pt's history of prior PE, plan to admit pt. CT Angio showed no acute findings. Advised pt of results and plan for admission. Pt given 2mg  dilaudid x3 in ED.     Chesley Noon Perla, [place] 10/26/14 [place], MD 10/26/14 1051

## 2014-10-26 NOTE — H&P (Signed)
[name] is an 34 y.o. male.   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 Pain is rated as 9 out of 10 in his legs him back. Now on his right rib cage area. He was in the emergency room 2 days in a row. He felt better yesterday and was discharged home but last night the pain came back. Associated with some shortness of breath. Patient is supposed to be on 2 L of oxygen at home but does not use it. He was found to be hypoxic here and placed on 2 L of oxygen again. His chest x-ray here shows possible consolidation in the right lung along the side of his pain. Subsequent chest CT angiogram showed no evidence of pneumonia and no evidence of PE. He has history of multiple PEs in the past which necessitated the test. He has no fever or chills.  Past Medical History  Diagnosis Date  . Sickle cell anemia   . Blood transfusion   . Acute embolism and thrombosis of right internal jugular vein   . Hypokalemia   . Mood disorder   . History of pulmonary embolus (PE)   . Avascular necrosis   . Leukocytosis     Chronic  . Thrombocytosis     Chronic  . Hypertension   . History of Clostridium difficile infection   . Uses marijuana   . Chronic anticoagulation   . Functional asplenia   . Former smoker   . Second hand tobacco smoke exposure   . Alcohol consumption of one to four drinks per day   . Noncompliance with medication regimen   . Sickle-cell crisis with associated acute chest syndrome 05/13/2013  . Acute chest syndrome 06/18/2013  . Demand ischemia 01/02/2014    Past Surgical History  Procedure Laterality Date  . Right hip replacement      08/2006  . Cholecystectomy      01/2008  . Porta cath placement    . Porta cath removal    . Umbilical hernia repair      01/2008  . Excision of left periauricular cyst       10/2009  . Excision of right ear lobe cyst with primary closur      11/2007  . Portacath placement  01/05/2012    Procedure: INSERTION PORT-A-CATH;  Surgeon: Odis Hollingshead, MD;  Location: [facility];  Service: General;  Laterality: N/A;  ultrasound guiced port a cath insertion with fluoroscopy    Family History  Problem Relation Age of Onset  . Sickle cell trait Mother   . Depression Mother   . Diabetes Mother   . Sickle cell trait Father   . Sickle cell trait Brother    Social History:  reports that he quit smoking about 4 years ago. He has never used smokeless tobacco. He reports that he uses illicit drugs (Marijuana) about twice per week. He reports that he does not drink alcohol.  Allergies: No Known Allergies   (Not in a hospital admission)  Results for orders placed or performed during the hospital encounter of 10/26/14 (from the past 48 hour(s))  Comprehensive metabolic panel     Status: Abnormal   Collection Time: 10/26/14  6:01 AM  Result Value Ref Range   Sodium 137 135 - 145 mmol/L   Potassium 3.6  3.5 - 5.1 mmol/L   Chloride 107 101 - 111 mmol/L   CO2 20 (L) 22 - 32 mmol/L   Glucose, Bld 114 (H) 65 - 99 mg/dL   BUN 11 6 - 20 mg/dL   Creatinine, Ser 0.74 0.61 - 1.24 mg/dL   Calcium 8.3 (L) 8.9 - 10.3 mg/dL   Total Protein 7.1 6.5 - 8.1 g/dL   Albumin 4.0 3.5 - 5.0 g/dL   AST 46 (H) 15 - 41 U/L   ALT 29 17 - 63 U/L   Alkaline Phosphatase 85 38 - 126 U/L   Total Bilirubin 6.1 (H) 0.3 - 1.2 mg/dL   GFR calc non Af Amer >60 >60 mL/min   GFR calc Af Amer >60 >60 mL/min    Comment: (NOTE) The eGFR has been calculated using the CKD EPI equation. This calculation has not been validated in all clinical situations. eGFR's persistently <60 mL/min signify possible Chronic Kidney Disease.    Anion gap 10 5 - 15  CBC with Differential     Status: Abnormal   Collection Time: 10/26/14  6:01 AM  Result Value Ref Range   WBC 17.4 (H) 4.0 - 10.5 K/uL   RBC 2.05 (L) 4.22  - 5.81 MIL/uL   Hemoglobin 6.6 (LL) 13.0 - 17.0 g/dL    Comment: RESULT REPEATED AND VERIFIED CRITICAL RESULT CALLED TO, READ BACK BY AND VERIFIED WITH: B DUCAN RN @ [phone] ON 10/26/14 BY C DAVIS    HCT 19.4 (L) 39.0 - 52.0 %   MCV 94.6 78.0 - 100.0 fL   MCH 32.2 26.0 - 34.0 pg   MCHC 34.0 30.0 - 36.0 g/dL   RDW 20.6 (H) 11.5 - 15.5 %   Platelets 569 (H) 150 - 400 K/uL   Neutrophils Relative % 64 %   Neutro Abs 11.1 (H) 1.7 - 7.7 K/uL   Lymphocytes Relative 21 %   Lymphs Abs 3.6 0.7 - 4.0 K/uL   Monocytes Relative 13 %   Monocytes Absolute 2.3 (H) 0.1 - 1.0 K/uL   Eosinophils Relative 2 %   Eosinophils Absolute 0.3 0.0 - 0.7 K/uL   Basophils Relative 1 %   Basophils Absolute 0.2 (H) 0.0 - 0.1 K/uL  Reticulocytes     Status: Abnormal   Collection Time: 10/26/14  6:01 AM  Result Value Ref Range   Retic Ct Pct 13.1 (H) 0.4 - 3.1 %   RBC. 2.03 (L) 4.22 - 5.81 MIL/uL   Retic Count, Manual 265.9 (H) 19.0 - 186.0 K/uL   *Note: Due to a large number of results and/or encounters for the requested time period, some results have not been displayed. A complete set of results can be found in Results Review.   Dg Chest 2 View  10/26/2014   CLINICAL DATA:  Chronic sickle cell pain  EXAM: CHEST  2 VIEW  COMPARISON:  04/12/2014  FINDINGS: Cardiomegaly.  No frank interstitial edema.  Mild patchy right basilar/right lower lobe opacity, atelectasis versus pneumonia.  No pleural effusion or pneumothorax.  Left chest power port terminating at the cavoatrial junction.  IMPRESSION: Mild patchy right lower lobe opacity, atelectasis versus pneumonia.  Cardiomegaly.  No frank interstitial edema.   Electronically Signed   By: Julian Hy M.D.   On: 10/26/2014 07:40   Ct Angio Chest Pe W/cm &/or Wo Cm  10/26/2014   CLINICAL DATA:  Sickle cell crisis.  Chest pain.  Low O2 sats.  EXAM: CT ANGIOGRAPHY CHEST WITH CONTRAST  TECHNIQUE:  Multidetector CT imaging of the chest was performed using the standard  protocol during bolus administration of intravenous contrast. Multiplanar CT image reconstructions and MIPs were obtained to evaluate the vascular anatomy.  CONTRAST:  117m OMNIPAQUE IOHEXOL 350 MG/ML SOLN  COMPARISON:  09/10/2014.  Chest x-ray earlier today.  FINDINGS: There is cardiomegaly. Aorta is normal caliber. Mildly enlarged anterior mediastinal/prevascular lymph nodes. Index node has a short axis diameter of 11 mm, stable. No hilar or axillary adenopathy.  Previously seen lower lobe pulmonary emboli not visualized on today's study. For no confluent airspace opacity right that linear areas of scarring in the lung bases. No effusions or confluent airspace opacities.  Left chest wall Port-A-Cath remains in place, unchanged. Chest wall soft tissues otherwise unremarkable. Imaging into the upper abdomen shows no acute findings. 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  No acute bony abnormality or focal bone lesion.  Review of the MIP images confirms the above findings.  IMPRESSION: Stable cardiomegaly and anterior mediastinal adenopathy.  Previously seen pulmonary emboli no longer visualized. Scarring in the lung bases.  No acute findings.   Electronically Signed   By: KRolm BaptiseM.D.   On: 10/26/2014 09:54    Review of Systems  Constitutional: Positive for malaise/fatigue.  HENT: Negative.   Eyes: Negative.   Respiratory: Positive for cough and shortness of breath.   Cardiovascular: Positive for chest pain. Negative for palpitations, orthopnea and leg swelling.  Gastrointestinal: Negative.   Genitourinary: Negative.   Musculoskeletal: Positive for myalgias, back pain and joint pain.  Skin: Negative.   Neurological: Negative.   Endo/Heme/Allergies: Negative.   Psychiatric/Behavioral: Negative.     Blood pressure 130/89, pulse 97, temperature 98.6 F (37 C), temperature source Oral, resp. rate 16, height 6' (1.829 m), weight 73.483 kg (162 lb), SpO2 95  %. Physical Exam  Constitutional: He is oriented to person, place, and time. He appears well-developed and well-nourished.  HENT:  Head: Normocephalic and atraumatic.  Eyes: Conjunctivae and EOM are normal. Pupils are equal, round, and reactive to light.  Neck: Normal range of motion. Neck supple.  Cardiovascular: Normal rate, regular rhythm and normal heart sounds.   Respiratory: Effort normal. No respiratory distress. He has rales.  GI: Soft. Bowel sounds are normal.  Musculoskeletal: Normal range of motion. He exhibits tenderness. He exhibits no edema.  Neurological: He is alert and oriented to person, place, and time.  Skin: Skin is warm and dry.     Assessment/Plan 34year old gentleman was known history of sickle cell disease presenting with what appeared to be sickle cell painful crisis.  #1 sickle cell painful crisis: Patient will be admitted to medical floor. Start him on Dilaudid PCA with Toradol and IV fluids. Start him on his long acting home pain medicines MS Contin. Monitor his pain control. He is opiates  Tolerant  #2 sickle cell anemia: Patient's hemoglobin is close to his baseline. Continue monitoring.  #3 leukocytosis: Probably due to sickle cell crisis. CT chest showed no pneumonia. No antibiotics therefore  #4 chronic anticoagulation: Patient currently on Lovenox twice a day. We will continue with that.  #5 chronic hypoxemia: Supposed to be on home O2. Due to significant pulmonary hypertension. Again continue current to treatment.  [name],[name] 10/26/2014, 10:06 AM

## 2014-10-27 DIAGNOSIS — D5700 Hb-SS disease with crisis, unspecified: Secondary | ICD-10-CM | POA: Diagnosis not present

## 2014-10-27 DIAGNOSIS — D638 Anemia in other chronic diseases classified elsewhere: Secondary | ICD-10-CM

## 2014-10-27 DIAGNOSIS — Z7901 Long term (current) use of anticoagulants: Secondary | ICD-10-CM

## 2014-10-27 LAB — COMPREHENSIVE METABOLIC PANEL
ALT: 38 U/L (ref 17–63)
AST: 51 U/L — ABNORMAL HIGH (ref 15–41)
Albumin: 4.3 g/dL (ref 3.5–5.0)
Alkaline Phosphatase: 82 U/L (ref 38–126)
Anion gap: 7 (ref 5–15)
BUN: 12 mg/dL (ref 6–20)
CHLORIDE: 109 mmol/L (ref 101–111)
CO2: 23 mmol/L (ref 22–32)
CREATININE: 0.59 mg/dL — AB (ref 0.61–1.24)
Calcium: 8.8 mg/dL — ABNORMAL LOW (ref 8.9–10.3)
Glucose, Bld: 113 mg/dL — ABNORMAL HIGH (ref 65–99)
POTASSIUM: 3.6 mmol/L (ref 3.5–5.1)
Sodium: 139 mmol/L (ref 135–145)
Total Bilirubin: 5.9 mg/dL — ABNORMAL HIGH (ref 0.3–1.2)
Total Protein: 7.7 g/dL (ref 6.5–8.1)

## 2014-10-27 LAB — CBC WITH DIFFERENTIAL/PLATELET
BASOS ABS: 0 10*3/uL (ref 0.0–0.1)
Basophils Relative: 0 %
EOS ABS: 0.3 10*3/uL (ref 0.0–0.7)
EOS PCT: 2 %
HCT: 19.9 % — ABNORMAL LOW (ref 39.0–52.0)
HEMOGLOBIN: 6.7 g/dL — AB (ref 13.0–17.0)
LYMPHS ABS: 3.1 10*3/uL (ref 0.7–4.0)
Lymphocytes Relative: 19 %
MCH: 32.2 pg (ref 26.0–34.0)
MCHC: 33.7 g/dL (ref 30.0–36.0)
MCV: 95.7 fL (ref 78.0–100.0)
Monocytes Absolute: 2.6 10*3/uL — ABNORMAL HIGH (ref 0.1–1.0)
Monocytes Relative: 16 %
NEUTROS PCT: 63 %
Neutro Abs: 10.2 10*3/uL — ABNORMAL HIGH (ref 1.7–7.7)
PLATELETS: 608 10*3/uL — AB (ref 150–400)
RBC: 2.08 MIL/uL — AB (ref 4.22–5.81)
RDW: 20.7 % — AB (ref 11.5–15.5)
WBC: 16.2 10*3/uL — AB (ref 4.0–10.5)
nRBC: 2 /100 WBC — ABNORMAL HIGH

## 2014-10-27 MED ORDER — SODIUM CHLORIDE 0.9 % IV SOLN: Freq: Once | INTRAVENOUS | Status: DC

## 2014-10-27 NOTE — Progress Notes (Signed)
SICKLE CELL SERVICE PROGRESS NOTE  [name] JHE:[id number] DOB: [birth year] DOA: 10/26/2014 PCP: [name], MD  Assessment/Plan: Principal Problem:   Hb-SS disease with crisis Active Problems:   Chronic anticoagulation   Anemia of chronic disease   Sickle cell anemia with pain   Chest pain  1. Hb SS with crisis: Pain has improved to 7/10 down from 9/10 on admission. He has used 24 mg with 54/40: demands/deliveries. Will continue PCA at current dose, Toradol and MS Contin. Pt eating and drinking well so will decrease IVF to [facility] rate. 2. Pulmonary Hypertension: Pt has a baseline oxygen requirement of 2/l/min.He has been adherent to the use of oxygen while at home and while sleeping. However, he does not use his oxygen when working as the tank is "too big" to take around. I have spoken with [place] from whom he receives his Oxygen about obtaining a conserving device. This will be ordered at the time of discharge 3. Leukocytosis; No evidence of infection. Likely related to crisis.  4. Anemia of chronic disease: Pt has a baseline Hb of 7. However in light of his pulmonary HTN. It is recommended that he keep his Hb levels around 8. Will transfuse 1 Unit of RBC. 5. Chronic pain: Continued on MS Contin. 6. Chronic Anticoagulation: Continue Lovenox.  Code Status: Full Code Family Communication: N/A Disposition Plan: Not yet ready for discharge  [place].  Pager [phone]. If 7PM-7AM, please contact night-coverage.  10/27/2014, 10:53 AM  LOS: 1 day    Consultants:  None  Procedures:  None  Antibiotics:  None  HPI/Subjective: Pain has improved to 7/10 down from 9/10 on admission. Pt states that he feels tired and fatigued. Last BM yesterday.  Objective: Filed Vitals:   10/27/14 0443 10/27/14 0739 10/27/14 0946 10/27/14 1022  BP:   110/62 140/69  Pulse:    104  Temp:    98.6 F (37 C)  TempSrc:    Oral  Resp: 16 20  20   Height:       Weight:      SpO2: 92% 91%  92%   Weight change:   Intake/Output Summary (Last 24 hours) at 10/27/14 1053 Last data filed at 10/27/14 0525  Gross per 24 hour  Intake 2634.2 ml  Output    700 ml  Net 1934.2 ml    General: Alert, awake, oriented x3, in no acute distress.  HEENT: [place]/AT PEERL, EOMI. Right exophthalmos at bseline Neck: Trachea midline,  no masses, no thyromegal,y no JVD, no carotid bruit OROPHARYNX:  Moist, No exudate/ erythema/lesions.  Heart: Regular rate and rhythm, without murmurs, rubs, gallops, PMI non-displaced, no heaves or thrills on palpation.  Lungs: Clear to auscultation, no wheezing or rhonchi noted. No increased vocal fremitus resonant to percussion  Abdomen: Soft, nontender, nondistended, positive bowel sounds, no masses no hepatosplenomegaly noted..  Neuro: No focal neurological deficits noted cranial nerves II through XII grossly intact. Strength at functional baseline in bilateral upper and lower  extremities. Musculoskeletal: No warm swelling or erythema around joints, no spinal tenderness noted. Psychiatric: Patient alert and oriented x3, good insight and cognition, good recent to remote recall.    Data Reviewed: Basic Metabolic Panel:  Recent Labs Lab 10/21/14 0033 10/25/14 0444 10/26/14 0601 10/26/14 1200 10/27/14 0535  NA 137 139 137  --  139  K 3.7 3.5 3.6  --  3.6  CL 106 108 107  --  109  CO2 23 22 20*  --  23  GLUCOSE 105* 104* 114*  --  113*  BUN 9 7 11   --  12  CREATININE 0.73 0.53* 0.74  --  0.59*  CALCIUM 8.9 9.1 8.3*  --  8.8*  MG  --   --   --  1.7  --    Liver Function Tests:  Recent Labs Lab 10/21/14 0033 10/26/14 0601 10/27/14 0535  AST 33 46* 51*  ALT 24 29 38  ALKPHOS 97 85 82  BILITOT 4.1* 6.1* 5.9*  PROT 8.5* 7.1 7.7  ALBUMIN 4.6 4.0 4.3   No results for input(s): LIPASE, AMYLASE in the last 168 hours. No results for input(s): AMMONIA in the last 168 hours. CBC:  Recent Labs Lab 10/21/14 0033  10/25/14 0444 10/26/14 0601 10/27/14 0535  WBC 14.4* 15.6* 17.4* 16.2*  NEUTROABS 9.8* 10.0* 11.1* 10.2*  HGB 8.3* 7.6* 6.6* 6.7*  HCT 24.7* 22.7* 19.4* 19.9*  MCV 97.6 95.8 94.6 95.7  PLT 611* 664* 569* 608*   Cardiac Enzymes: No results for input(s): CKTOTAL, CKMB, CKMBINDEX, TROPONINI in the last 168 hours. BNP (last 3 results)  Recent Labs  08/29/14 0810 09/10/14 0612  BNP 1117.0* 549.5*    ProBNP (last 3 results)  Recent Labs  01/01/14 0909  PROBNP 5790.0*    CBG: No results for input(s): GLUCAP in the last 168 hours.  No results found for this or any previous visit (from the past 240 hour(s)).   Studies: Dg Chest 2 View  10/26/2014   CLINICAL DATA:  Chronic sickle cell pain  EXAM: CHEST  2 VIEW  COMPARISON:  04/12/2014  FINDINGS: Cardiomegaly.  No frank interstitial edema.  Mild patchy right basilar/right lower lobe opacity, atelectasis versus pneumonia.  No pleural effusion or pneumothorax.  Left chest power port terminating at the cavoatrial junction.  IMPRESSION: Mild patchy right lower lobe opacity, atelectasis versus pneumonia.  Cardiomegaly.  No frank interstitial edema.   Electronically Signed   By: Julian Hy M.D.   On: 10/26/2014 07:40   Ct Angio Chest Pe W/cm &/or Wo Cm  10/26/2014   CLINICAL DATA:  Sickle cell crisis.  Chest pain.  Low O2 sats.  EXAM: CT ANGIOGRAPHY CHEST WITH CONTRAST  TECHNIQUE: Multidetector CT imaging of the chest was performed using the standard protocol during bolus administration of intravenous contrast. Multiplanar CT image reconstructions and MIPs were obtained to evaluate the vascular anatomy.  CONTRAST:  154mL OMNIPAQUE IOHEXOL 350 MG/ML SOLN  COMPARISON:  09/10/2014.  Chest x-ray earlier today.  FINDINGS: There is cardiomegaly. Aorta is normal caliber. Mildly enlarged anterior mediastinal/prevascular lymph nodes. Index node has a short axis diameter of 11 mm, stable. No hilar or axillary adenopathy.  Previously seen lower  lobe pulmonary emboli not visualized on today's study. For no confluent airspace opacity right that linear areas of scarring in the lung bases. No effusions or confluent airspace opacities.  Left chest wall Port-A-Cath remains in place, unchanged. Chest wall soft tissues otherwise unremarkable. Imaging into the upper abdomen shows no acute findings. 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  No acute bony abnormality or focal bone lesion.  Review of the MIP images confirms the above findings.  IMPRESSION: Stable cardiomegaly and anterior mediastinal adenopathy.  Previously seen pulmonary emboli no longer visualized. Scarring in the lung bases.  No acute findings.   Electronically Signed   By: Rolm Baptise M.D.   On: 10/26/2014 09:54    Scheduled Meds: . aspirin  81 mg Oral Daily  .  cholecalciferol  2,000 Units Oral Daily  . diltiazem  120 mg Oral Daily  . enoxaparin  110 mg Subcutaneous Q24H  . folic acid  1 mg Oral q morning - 10a  . HYDROmorphone PCA 2 mg/mL   Intravenous 6 times per day  . ketorolac  30 mg Intravenous 4 times per day  . lisinopril  10 mg Oral Daily  . metoprolol tartrate  25 mg Oral Daily  . morphine  30 mg Oral Q12H  . potassium chloride SA  20 mEq Oral q morning - 10a  . senna-docusate  1 tablet Oral BID   Continuous Infusions: . dextrose 5 % and 0.45% NaCl 125 mL/hr at 10/27/14 0131    Time spent 35 minutes. 50 % spent in discussion, counseling and coordination of care.

## 2014-10-28 LAB — CBC WITH DIFFERENTIAL/PLATELET
Basophils Absolute: 0.1 10*3/uL (ref 0.0–0.1)
Basophils Relative: 0 %
EOS PCT: 2 %
Eosinophils Absolute: 0.3 10*3/uL (ref 0.0–0.7)
HCT: 22 % — ABNORMAL LOW (ref 39.0–52.0)
HEMOGLOBIN: 7.5 g/dL — AB (ref 13.0–17.0)
LYMPHS ABS: 2.5 10*3/uL (ref 0.7–4.0)
LYMPHS PCT: 16 %
MCH: 32.2 pg (ref 26.0–34.0)
MCHC: 34.1 g/dL (ref 30.0–36.0)
MCV: 94.4 fL (ref 78.0–100.0)
MONO ABS: 2.2 10*3/uL — AB (ref 0.1–1.0)
Monocytes Relative: 14 %
NEUTROS ABS: 10.8 10*3/uL — AB (ref 1.7–7.7)
Neutrophils Relative %: 68 %
PLATELETS: 580 10*3/uL — AB (ref 150–400)
RBC: 2.33 MIL/uL — ABNORMAL LOW (ref 4.22–5.81)
RDW: 20.5 % — ABNORMAL HIGH (ref 11.5–15.5)
WBC: 15.8 10*3/uL — ABNORMAL HIGH (ref 4.0–10.5)

## 2014-10-28 LAB — TYPE AND SCREEN
ABO/RH(D): O POS
Antibody Screen: NEGATIVE
Unit division: 0

## 2014-10-28 MED ORDER — HYDROMORPHONE HCL 4 MG PO TABS
4.0000 mg | ORAL_TABLET | ORAL | Status: DC
  Administered 2014-10-28 – 2014-10-30 (×13): 4 mg via ORAL
  Filled 2014-10-28 (×13): qty 1

## 2014-10-28 NOTE — Progress Notes (Signed)
SICKLE CELL SERVICE PROGRESS NOTE  [name] GEZ:[id number] DOB: 18-Aug-1980 DOA: 10/26/2014 PCP: [name], MD  Assessment/Plan: Principal Problem:   Hb-SS disease with crisis Active Problems:   Chronic anticoagulation   Anemia of chronic disease   Sickle cell anemia with pain   Chest pain  1. Hb SS with crisis: Pt reports that pain increased to 9/10 last night while he was receiving blood and without his PCA. The pain has improved to day to 7/10.  He has used 30.6 mg with 63/51: demands/deliveries on PCA in the last 24 hours. Will continue PCA at current dose, schedule oral dilaudid, and continue Toradol and MS Contin at current dose.  Pt eating and drinking well so will decrease IVF to [facility] rate. Anticipate discharge in 24-48 hours. 2. Pulmonary Hypertension: Pt has a baseline oxygen requirement of 2/l/min.He has been adherent to the use of oxygen while at home and while sleeping. However, he does not use his oxygen when working as the tank is "too big" to take around. I have spoken with [place] from whom he receives his Oxygen about obtaining a conserving device. This will be ordered at the time of discharge 3. Leukocytosis; No evidence of infection. Likely related to crisis.  4. Anemia of chronic disease: Pt has a baseline Hb of 7. However in light of his pulmonary HTN. It is recommended that he keep his Hb levels around 8. He was transfused 1 Unit of RBC and Hb today is 7.5.  5. Chronic pain: Continued on MS Contin. 6. Chronic Anticoagulation: Continue Lovenox.  Code Status: Full Code Family Communication: N/A Disposition Plan: Anticipate discharge in 24-48 hours.  [name],[name]  Pager [phone]. If 7PM-7AM, please contact night-coverage.  10/28/2014, 2:11 PM  LOS: 2 days    Consultants:  None  Procedures:  None  Antibiotics:  None  HPI/Subjective: Pain is at 8/10 today but states that he feels more energetic today. Pt non-toxic  appearing.  Objective: Filed Vitals:   10/28/14 0559 10/28/14 0936 10/28/14 1142 10/28/14 1356  BP: 124/92 124/87  122/84  Pulse: 88 79  80  Temp: 97.8 F (36.6 C) 97.4 F (36.3 C)  98.9 F (37.2 C)  TempSrc: Oral Axillary  Oral  Resp: 18 18 18 18   Height:      Weight: 169 lb 9.6 oz (76.93 kg)     SpO2: 94% 96% 96% 92%   Weight change: 7 lb 9.6 oz (3.447 kg)  Intake/Output Summary (Last 24 hours) at 10/28/14 1411 Last data filed at 10/27/14 1935  Gross per 24 hour  Intake    300 ml  Output      0 ml  Net    300 ml    General: Alert, awake, oriented x3, in no acute distress.  HEENT: [place]/AT PEERL, EOMI. Right exophthalmos at bseline Heart: Regular rate and rhythm, without murmurs, rubs, gallops, PMI non-displaced, no heaves or thrills on palpation.  Lungs: Clear to auscultation, no wheezing or rhonchi noted. No increased vocal fremitus resonant to percussion  Abdomen: Soft, nontender, nondistended, positive bowel sounds, no masses no hepatosplenomegaly noted..  Neuro: No focal neurological deficits noted cranial nerves II through XII grossly intact. Strength at functional baseline in bilateral upper and lower  extremities. Musculoskeletal: No warm swelling or erythema around joints, no spinal tenderness noted. Psychiatric: Patient alert and oriented x3, good insight and cognition, good recent to remote recall.    Data Reviewed: Basic Metabolic Panel:  Recent Labs Lab 10/25/14  1779 10/26/14 0601 10/26/14 1200 10/27/14 0535  NA 139 137  --  139  K 3.5 3.6  --  3.6  CL 108 107  --  109  CO2 22 20*  --  23  GLUCOSE 104* 114*  --  113*  BUN 7 11  --  12  CREATININE 0.53* 0.74  --  0.59*  CALCIUM 9.1 8.3*  --  8.8*  MG  --   --  1.7  --    Liver Function Tests:  Recent Labs Lab 10/26/14 0601 10/27/14 0535  AST 46* 51*  ALT 29 38  ALKPHOS 85 82  BILITOT 6.1* 5.9*  PROT 7.1 7.7  ALBUMIN 4.0 4.3   No results for input(s): LIPASE, AMYLASE in the last 168  hours. No results for input(s): AMMONIA in the last 168 hours. CBC:  Recent Labs Lab 10/25/14 0444 10/26/14 0601 10/27/14 0535 10/28/14 0630  WBC 15.6* 17.4* 16.2* 15.8*  NEUTROABS 10.0* 11.1* 10.2* 10.8*  HGB 7.6* 6.6* 6.7* 7.5*  HCT 22.7* 19.4* 19.9* 22.0*  MCV 95.8 94.6 95.7 94.4  PLT 664* 569* 608* 580*   Cardiac Enzymes: No results for input(s): CKTOTAL, CKMB, CKMBINDEX, TROPONINI in the last 168 hours. BNP (last 3 results)  Recent Labs  08/29/14 0810 09/10/14 0612  BNP 1117.0* 549.5*    ProBNP (last 3 results)  Recent Labs  01/01/14 0909  PROBNP 5790.0*    CBG: No results for input(s): GLUCAP in the last 168 hours.  No results found for this or any previous visit (from the past 240 hour(s)).   Studies: Dg Chest 2 View  10/26/2014   CLINICAL DATA:  Chronic sickle cell pain  EXAM: CHEST  2 VIEW  COMPARISON:  04/12/2014  FINDINGS: Cardiomegaly.  No frank interstitial edema.  Mild patchy right basilar/right lower lobe opacity, atelectasis versus pneumonia.  No pleural effusion or pneumothorax.  Left chest power port terminating at the cavoatrial junction.  IMPRESSION: Mild patchy right lower lobe opacity, atelectasis versus pneumonia.  Cardiomegaly.  No frank interstitial edema.   Electronically Signed   By: Julian Hy M.D.   On: 10/26/2014 07:40   Ct Angio Chest Pe W/cm &/or Wo Cm  10/26/2014   CLINICAL DATA:  Sickle cell crisis.  Chest pain.  Low O2 sats.  EXAM: CT ANGIOGRAPHY CHEST WITH CONTRAST  TECHNIQUE: Multidetector CT imaging of the chest was performed using the standard protocol during bolus administration of intravenous contrast. Multiplanar CT image reconstructions and MIPs were obtained to evaluate the vascular anatomy.  CONTRAST:  141mL OMNIPAQUE IOHEXOL 350 MG/ML SOLN  COMPARISON:  09/10/2014.  Chest x-ray earlier today.  FINDINGS: There is cardiomegaly. Aorta is normal caliber. Mildly enlarged anterior mediastinal/prevascular lymph nodes. Index  node has a short axis diameter of 11 mm, stable. No hilar or axillary adenopathy.  Previously seen lower lobe pulmonary emboli not visualized on today's study. For no confluent airspace opacity right that linear areas of scarring in the lung bases. No effusions or confluent airspace opacities.  Left chest wall Port-A-Cath remains in place, unchanged. Chest wall soft tissues otherwise unremarkable. Imaging into the upper abdomen shows no acute findings. 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  No acute bony abnormality or focal bone lesion.  Review of the MIP images confirms the above findings.  IMPRESSION: Stable cardiomegaly and anterior mediastinal adenopathy.  Previously seen pulmonary emboli no longer visualized. Scarring in the lung bases.  No acute findings.   Electronically Signed  By: Rolm Baptise M.D.   On: 10/26/2014 09:54    Scheduled Meds: . sodium chloride   Intravenous Once  . aspirin  81 mg Oral Daily  . cholecalciferol  2,000 Units Oral Daily  . diltiazem  120 mg Oral Daily  . enoxaparin  110 mg Subcutaneous Q24H  . folic acid  1 mg Oral q morning - 10a  . HYDROmorphone PCA 2 mg/mL   Intravenous 6 times per day  . HYDROmorphone  4 mg Oral Q4H  . ketorolac  30 mg Intravenous 4 times per day  . lisinopril  10 mg Oral Daily  . metoprolol tartrate  25 mg Oral Daily  . morphine  30 mg Oral Q12H  . potassium chloride SA  20 mEq Oral q morning - 10a  . senna-docusate  1 tablet Oral BID   Continuous Infusions: . dextrose 5 % and 0.45% NaCl 10 mL/hr at 10/27/14 1217    Time spent 30 minutes. 50 % spent in discussion, counseling and coordination of care.

## 2014-10-28 NOTE — Care Management Important Message (Signed)
Important Message  Patient Details  Name: ROXIE GUEYE MRN: [id number] Date of Birth: 07-02-1980   Medicare Important Message Given:  [facility] notification given    [name] 10/28/2014, 11:52 AMImportant Message  Patient Details  Name: JENNY OMDAHL MRN: [id number] Date of Birth: 08/27/1980   Medicare Important Message Given:  Yes-second notification given    [name] 10/28/2014, 11:52 AM

## 2014-10-29 MED ORDER — HYDROMORPHONE 2 MG/ML HIGH CONCENTRATION IV PCA SOLN
INTRAVENOUS | Status: DC
  Administered 2014-10-29: 10.5 mg via INTRAVENOUS
  Administered 2014-10-29: 3.5 mg via INTRAVENOUS
  Administered 2014-10-29: 19:00:00 via INTRAVENOUS
  Administered 2014-10-29: 4.3 mg via INTRAVENOUS
  Administered 2014-10-29: 7 mg via INTRAVENOUS
  Administered 2014-10-30: 4.5 mg via INTRAVENOUS
  Administered 2014-10-30: 2.5 mg via INTRAVENOUS
  Filled 2014-10-29: qty 25

## 2014-10-29 NOTE — Care Management Note (Signed)
Case Management Note  Patient Details  Name: [name] MRN: [id number] Date of Birth: 1981-01-05  Subjective/Objective:                34 yo admitted with [facility]    Action/Plan: From home alone  Expected Discharge Date:  10/29/14               Expected Discharge Plan:  Home/Self Care  In-House Referral:     Discharge planning Services  CM Consult  Post Acute Care Choice:    Choice offered to:     DME Arranged:    DME Agency:     HH Arranged:    [place] Agency:     Status of Service:  In process, will continue to follow  Medicare Important Message Given:  Yes-second notification given Date Medicare IM Given:    Medicare IM give by:    Date Additional Medicare IM Given:    Additional Medicare Important Message give by:     If discussed at [place] of Stay Meetings, dates discussed:    Additional Comments: Chart reviewed and CM following for DC needs. Lynnell Catalan, RN 10/29/2014, 10:18 AM

## 2014-10-29 NOTE — Progress Notes (Signed)
SICKLE CELL SERVICE PROGRESS NOTE  [name] QZE:[id number] DOB: 28-Feb-1980 DOA: 10/26/2014 PCP: [name], MD  Assessment/Plan: Principal Problem:   Hb-SS disease with crisis Active Problems:   Chronic anticoagulation   Anemia of chronic disease   Sickle cell anemia with pain   Chest pain  1. Hb SS with crisis: Pt reports that pain still at 7/10.  He has used 27  mg with 54/41: demands/deliveries on PCA in the last 24 hours despite scheduled Dilaudid. This is still significant usage despite a robust oral regimen. Will wean PCA from current dose, continue scheduled oral dilaudid, and continue Toradol and MS Contin at current dose.  Plan for  discharge in [place]. 2. Pulmonary Hypertension: Pt has a baseline oxygen requirement of 2/l/min.He has been adherent to the use of oxygen while at home and while sleeping. However, he does not use his oxygen when working as the tank is "too big" to take around. I have spoken with [place] from whom he receives his Oxygen about obtaining a conserving device. This will be ordered at the time of discharge 3. Leukocytosis; No evidence of infection. Likely related to crisis.  4. Anemia of chronic disease: Pt has a baseline Hb of 7. However in light of his pulmonary HTN. It is recommended that he keep his Hb levels around 8. He was transfused 1 Unit of RBC and Hb  is 7.5 post transfusion. Will recheck for resumption of Hydrea tomorrow.  5. Chronic pain: Continued on MS Contin. 6. 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 Continue Lovenox.  Code Status: Full Code Family Communication: N/A Disposition Plan: Anticipate discharge tomorrow. [name],[name]  Pager [phone]. If 7PM-7AM, please contact night-coverage.  10/29/2014, 9:56 AM  LOS: 3 days    Consultants:  None  Procedures:  None  Antibiotics:  None  HPI/Subjective: Pain is at 7/10 today and localized to his legs and back.  Objective: Filed Vitals:    10/29/14 0210 10/29/14 0415 10/29/14 0607 10/29/14 0833  BP: 135/92  123/67   Pulse: 65  80   Temp: 98.3 F (36.8 C)  98.1 F (36.7 C)   TempSrc: Oral  Oral   Resp: 18 15  15   Height:      Weight:   170 lb 13.7 oz (77.5 kg)   SpO2: 95% 93% 92%    Weight change: 1 lb 4.1 oz (0.57 kg)  Intake/Output Summary (Last 24 hours) at 10/29/14 0956 Last data filed at 10/29/14 0600  Gross per 24 hour  Intake 1273.59 ml  Output    750 ml  Net 523.59 ml    General: Alert, awake, oriented x3, in no acute distress.  HEENT: [place]/AT PEERL, EOMI. Right exophthalmos at bseline Heart: Regular rate and rhythm, without murmurs, rubs, gallops, PMI non-displaced, no heaves or thrills on palpation.  Lungs: Clear to auscultation, no wheezing or rhonchi noted. No increased vocal fremitus resonant to percussion  Abdomen: Soft, nontender, nondistended, positive bowel sounds, no masses no hepatosplenomegaly noted.  Neuro: No focal neurological deficits noted cranial nerves II through XII grossly intact. Strength at functional baseline in bilateral upper and lower  extremities. Musculoskeletal: No warm swelling or erythema around joints, no spinal tenderness noted. Psychiatric: Patient alert and oriented x3, good insight and cognition, good recent to remote recall.    Data Reviewed: Basic Metabolic Panel:  Recent Labs Lab 10/25/14 0444 10/26/14 0601 10/26/14 1200 10/27/14 0535  NA 139 137  --  139  K 3.5 3.6  --  3.6  CL 108 107  --  109  CO2 22 20*  --  23  GLUCOSE 104* 114*  --  113*  BUN 7 11  --  12  CREATININE 0.53* 0.74  --  0.59*  CALCIUM 9.1 8.3*  --  8.8*  MG  --   --  1.7  --    Liver Function Tests:  Recent Labs Lab 10/26/14 0601 10/27/14 0535  AST 46* 51*  ALT 29 38  ALKPHOS 85 82  BILITOT 6.1* 5.9*  PROT 7.1 7.7  ALBUMIN 4.0 4.3   No results for input(s): LIPASE, AMYLASE in the last 168 hours. No results for input(s): AMMONIA in the last 168 hours. CBC:  Recent  Labs Lab 10/25/14 0444 10/26/14 0601 10/27/14 0535 10/28/14 0630  WBC 15.6* 17.4* 16.2* 15.8*  NEUTROABS 10.0* 11.1* 10.2* 10.8*  HGB 7.6* 6.6* 6.7* 7.5*  HCT 22.7* 19.4* 19.9* 22.0*  MCV 95.8 94.6 95.7 94.4  PLT 664* 569* 608* 580*   Cardiac Enzymes: No results for input(s): CKTOTAL, CKMB, CKMBINDEX, TROPONINI in the last 168 hours. BNP (last 3 results)  Recent Labs  08/29/14 0810 09/10/14 0612  BNP 1117.0* 549.5*    ProBNP (last 3 results)  Recent Labs  01/01/14 0909  PROBNP 5790.0*    CBG: No results for input(s): GLUCAP in the last 168 hours.  No results found for this or any previous visit (from the past 240 hour(s)).   Studies: Dg Chest 2 View  10/26/2014   CLINICAL DATA:  Chronic sickle cell pain  EXAM: CHEST  2 VIEW  COMPARISON:  04/12/2014  FINDINGS: Cardiomegaly.  No frank interstitial edema.  Mild patchy right basilar/right lower lobe opacity, atelectasis versus pneumonia.  No pleural effusion or pneumothorax.  Left chest power port terminating at the cavoatrial junction.  IMPRESSION: Mild patchy right lower lobe opacity, atelectasis versus pneumonia.  Cardiomegaly.  No frank interstitial edema.   Electronically Signed   By: Julian Hy M.D.   On: 10/26/2014 07:40   Ct Angio Chest Pe W/cm &/or Wo Cm  10/26/2014   CLINICAL DATA:  Sickle cell crisis.  Chest pain.  Low O2 sats.  EXAM: CT ANGIOGRAPHY CHEST WITH CONTRAST  TECHNIQUE: Multidetector CT imaging of the chest was performed using the standard protocol during bolus administration of intravenous contrast. Multiplanar CT image reconstructions and MIPs were obtained to evaluate the vascular anatomy.  CONTRAST:  141mL OMNIPAQUE IOHEXOL 350 MG/ML SOLN  COMPARISON:  09/10/2014.  Chest x-ray earlier today.  FINDINGS: There is cardiomegaly. Aorta is normal caliber. Mildly enlarged anterior mediastinal/prevascular lymph nodes. Index node has a short axis diameter of 11 mm, stable. No hilar or axillary adenopathy.   Previously seen lower lobe pulmonary emboli not visualized on today's study. For no confluent airspace opacity right that linear areas of scarring in the lung bases. No effusions or confluent airspace opacities.  Left chest wall Port-A-Cath remains in place, unchanged. Chest wall soft tissues otherwise unremarkable. Imaging into the upper abdomen shows no acute findings. 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  No acute bony abnormality or focal bone lesion.  Review of the MIP images confirms the above findings.  IMPRESSION: Stable cardiomegaly and anterior mediastinal adenopathy.  Previously seen pulmonary emboli no longer visualized. Scarring in the lung bases.  No acute findings.   Electronically Signed   By: Rolm Baptise M.D.   On: 10/26/2014 09:54    Scheduled Meds: . sodium chloride   Intravenous  Once  . aspirin  81 mg Oral Daily  . cholecalciferol  2,000 Units Oral Daily  . diltiazem  120 mg Oral Daily  . enoxaparin  110 mg Subcutaneous Q24H  . folic acid  1 mg Oral q morning - 10a  . HYDROmorphone PCA 2 mg/mL   Intravenous 6 times per day  . HYDROmorphone  4 mg Oral Q4H  . ketorolac  30 mg Intravenous 4 times per day  . lisinopril  10 mg Oral Daily  . metoprolol tartrate  25 mg Oral Daily  . morphine  30 mg Oral Q12H  . potassium chloride SA  20 mEq Oral q morning - 10a  . senna-docusate  1 tablet Oral BID   Continuous Infusions: . dextrose 5 % and 0.45% NaCl 10 mL/hr at 10/27/14 1217    Time spent 25 minutes.

## 2014-10-30 LAB — RETICULOCYTES
RBC.: 2.23 MIL/uL — ABNORMAL LOW (ref 4.22–5.81)
RETIC CT PCT: 19.3 % — AB (ref 0.4–3.1)
Retic Count, Absolute: 430.4 10*3/uL — ABNORMAL HIGH (ref 19.0–186.0)

## 2014-10-30 LAB — BASIC METABOLIC PANEL
ANION GAP: 5 (ref 5–15)
BUN: 12 mg/dL (ref 6–20)
CO2: 23 mmol/L (ref 22–32)
Calcium: 8.6 mg/dL — ABNORMAL LOW (ref 8.9–10.3)
Chloride: 112 mmol/L — ABNORMAL HIGH (ref 101–111)
Creatinine, Ser: 0.67 mg/dL (ref 0.61–1.24)
GFR calc non Af Amer: >60 mL/min (ref >60)
Glucose, Bld: 119 mg/dL — ABNORMAL HIGH (ref 65–99)
Potassium: 3.9 mmol/L (ref 3.5–5.1)
Sodium: 140 mmol/L (ref 135–145)

## 2014-10-30 LAB — CBC WITH DIFFERENTIAL/PLATELET
Basophils Absolute: 0.1 10*3/uL (ref 0.0–0.1)
Basophils Relative: 1 %
EOS ABS: 0.3 10*3/uL (ref 0.0–0.7)
Eosinophils Relative: 2 %
HEMATOCRIT: 20.7 % — AB (ref 39.0–52.0)
HEMOGLOBIN: 7.2 g/dL — AB (ref 13.0–17.0)
LYMPHS ABS: 2.3 10*3/uL (ref 0.7–4.0)
Lymphocytes Relative: 17 %
MCH: 32.3 pg (ref 26.0–34.0)
MCHC: 34.8 g/dL (ref 30.0–36.0)
MCV: 92.8 fL (ref 78.0–100.0)
MONO ABS: 2.6 10*3/uL — AB (ref 0.1–1.0)
MONOS PCT: 19 %
NEUTROS PCT: 62 %
Neutro Abs: 8.4 10*3/uL — ABNORMAL HIGH (ref 1.7–7.7)
Platelets: 544 10*3/uL — ABNORMAL HIGH (ref 150–400)
RBC: 2.23 MIL/uL — ABNORMAL LOW (ref 4.22–5.81)
RDW: 19.8 % — ABNORMAL HIGH (ref 11.5–15.5)
WBC: 13.7 10*3/uL — ABNORMAL HIGH (ref 4.0–10.5)

## 2014-10-30 MED ORDER — HEPARIN SOD (PORK) LOCK FLUSH 100 UNIT/ML IV SOLN
500.0000 [IU] | Freq: Once | INTRAVENOUS | Status: DC
  Filled 2014-10-30: qty 5

## 2014-10-30 NOTE — Discharge Summary (Addendum)
[name] MRN: [id number] DOB/AGE: 06/19/1980 34 y.o.  Admit date: 10/26/2014 Discharge date: 10/30/2014  Primary Care Physician:  [name], MD   Recommendations:  1. Address Hydrea resumption on next visit (see below). 2. Referral to Dr. Kale-Hematology 3. Referral to CHF clinic with Dr. Missy Sabins   Discharge Diagnoses:   Patient Active Problem List   Diagnosis Date Noted  . Atrial fibrillation with RVR 08/29/2014    Priority: High  . Cor pulmonale, chronic 08/25/2014    Priority: High  . Pulmonary HTN 06/18/2013    Priority: High  . Functional asplenia     Priority: High  . Anemia of chronic disease 06/25/2014    Priority: Medium  . Hypoxia 03/14/2014    Priority: Medium  . Hb-SS disease with crisis 01/22/2014    Priority: Medium  . Chronic anticoagulation 08/22/2013    Priority: Medium  . Avascular necrosis     Priority: Low  . Chest pain   . Hb-SS disease without crisis 10/07/2014  . Respiratory failure   . Acute respiratory failure with hypoxia   . Acute chest syndrome in sickle crisis   . PVC's (premature ventricular contractions) 09/10/2014  . Abnormal EKG 09/10/2014  . Hypoxemia   . Chronic atrial fibrillation 09/09/2014  . Sickle cell anemia with pain 09/09/2014  . Sickle cell crisis 09/08/2014  . Paronychia of great toe, left   . Bacteremia   . Leukocytosis   . Elevated troponin I level 08/29/2014  . Hypokalemia 08/29/2014  . Ankle edema 07/07/2014  . Sickle cell anemia 06/25/2014  . PAH (pulmonary artery hypertension) 03/18/2014  . Paralytic strabismus, external ophthalmoplegia   . Chronic pain syndrome 12/12/2013  . Essential hypertension 08/22/2013  . Vitamin D deficiency 02/13/2013  . Uses marijuana 01/13/2013  . Embolism, pulmonary with infarction 07/09/2012  . Hx of pulmonary embolus 06/29/2012  . Hemochromatosis 12/14/2011    DISCHARGE MEDICATION:   Medication List    TAKE these medications        aspirin 81 MG  chewable tablet  Chew 1 tablet (81 mg total) by mouth daily.     diltiazem 120 MG 24 hr capsule  Commonly known as:  CARDIZEM CD  Take 1 capsule (120 mg total) by mouth daily.     enoxaparin 150 MG/ML injection  Commonly known as:  LOVENOX  Inject 0.73 mLs (110 mg total) into the skin daily.     folic acid 1 MG tablet  Commonly known as:  FOLVITE  Take 1 tablet (1 mg total) by mouth every morning.     HYDROmorphone 4 MG tablet  Commonly known as:  DILAUDID  Take 1 tablet (4 mg total) by mouth every 4 (four) hours as needed for severe pain.     lisinopril 10 MG tablet  Commonly known as:  PRINIVIL,ZESTRIL  Take 1 tablet (10 mg total) by mouth daily.     metoprolol tartrate 25 MG tablet  Commonly known as:  LOPRESSOR  Take 1 tablet (25 mg total) by mouth daily.     morphine 30 MG 12 hr tablet  Commonly known as:  MS CONTIN  Take 1 tablet (30 mg total) by mouth every 12 (twelve) hours.     potassium chloride SA 20 MEQ tablet  Commonly known as:  K-DUR,KLOR-CON  Take 1 tablet (20 mEq total) by mouth every morning.     Vitamin D 2000 UNITS tablet  Take 1 tablet (2,000 Units total) by mouth daily.  zolpidem 10 MG tablet  Commonly known as:  AMBIEN  Take 1 tablet (10 mg total) by mouth at bedtime as needed for sleep.          Consults:     SIGNIFICANT DIAGNOSTIC STUDIES:  Dg Chest 2 View  10/26/2014   CLINICAL DATA:  Chronic sickle cell pain  EXAM: CHEST  2 VIEW  COMPARISON:  04/12/2014  FINDINGS: Cardiomegaly.  No frank interstitial edema.  Mild patchy right basilar/right lower lobe opacity, atelectasis versus pneumonia.  No pleural effusion or pneumothorax.  Left chest power port terminating at the cavoatrial junction.  IMPRESSION: Mild patchy right lower lobe opacity, atelectasis versus pneumonia.  Cardiomegaly.  No frank interstitial edema.   Electronically Signed   By: Julian Hy M.D.   On: 10/26/2014 07:40   Ct Angio Chest Pe W/cm &/or Wo Cm  10/26/2014    CLINICAL DATA:  Sickle cell crisis.  Chest pain.  Low O2 sats.  EXAM: CT ANGIOGRAPHY CHEST WITH CONTRAST  TECHNIQUE: Multidetector CT imaging of the chest was performed using the standard protocol during bolus administration of intravenous contrast. Multiplanar CT image reconstructions and MIPs were obtained to evaluate the vascular anatomy.  CONTRAST:  182mL OMNIPAQUE IOHEXOL 350 MG/ML SOLN  COMPARISON:  09/10/2014.  Chest x-ray earlier today.  FINDINGS: There is cardiomegaly. Aorta is normal caliber. Mildly enlarged anterior mediastinal/prevascular lymph nodes. Index node has a short axis diameter of 11 mm, stable. No hilar or axillary adenopathy.  Previously seen lower lobe pulmonary emboli not visualized on today's study. For no confluent airspace opacity right that linear areas of scarring in the lung bases. No effusions or confluent airspace opacities.  Left chest wall Port-A-Cath remains in place, unchanged. Chest wall soft tissues otherwise unremarkable. Imaging into the upper abdomen shows no acute findings. 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  No acute bony abnormality or focal bone lesion.  Review of the MIP images confirms the above findings.  IMPRESSION: Stable cardiomegaly and anterior mediastinal adenopathy.  Previously seen pulmonary emboli no longer visualized. Scarring in the lung bases.  No acute findings.   Electronically Signed   By: Rolm Baptise M.D.   On: 10/26/2014 09:54    No results found for this or any previous visit (from the past 240 hour(s)).  BRIEF ADMITTING H & P: 34 year old gentleman well known to our service was known history of sickle cell disease who is opiates tolerant presenting with shortness of breath and pain all over. Pain is rated as 9 out of 10 in his legs him back. Now on his right rib cage area. He was in the emergency room 2 days in a row. He felt better yesterday and was discharged home but last night the pain came  back. Associated with some shortness of breath. Patient is supposed to be on 2 L of oxygen at home but does not use it. He was found to be hypoxic here and placed on 2 L of oxygen again. His chest x-ray here shows possible consolidation in the right lung along the side of his pain. Subsequent chest CT angiogram showed no evidence of pneumonia and no evidence of PE. He has history of multiple PEs in the past which necessitated the test. He has no fever or chills.   Hospital Course:  Present on Admission:  . Hb-SS disease with crisis: Pt was treated with IV Dilaudid, Toradol and IVF. MS Contin was continued for management of chronic pain component.  As  his pain decreased he was  transitioned to oral analgesics and is discharged home on MS Contin and Dilaudid 4 mg every 4 hours as needed.  . Anemia of chronic disease: Due to Pulmonary HTN, it is recommended that his Hb be kept around 8 g/dl. His Hb was at a nidus of 6.6.  Pt received 1 unit of blood and at the time of discharge his Hb was at 7.2.  . Sickle Cell Disease: He had been on Hydrea in the past at 1500 mg daily. The patient has not taken his Hydrea since 10/07/18`6 for reasons that are unclear. He was discharged on Hydrea after his last hospitalization but somewhere since then it was stopped. . Chronic anticoagulation: Continue Lovenox for recurrent PE's. .  Pulmonary HTN: Due to pulmonary HTN, oxygen saturations need to remain above 93% per recommendations form Cardiology. He will be discharged in Oxygen at a concentration of 3 l/min to be worn around the clock. Will also ask [facility] to provide a conserving device for Oxygen.   Disposition and Follow-up:  Pt discharged in good condition and has a follow-up appointment in the clinic with [place] Hollis on 11/06/2014.    DISCHARGE EXAM:  General: Alert, awake, oriented x3, in no apparent distress.   Vital Signs: BP 133/88, HR 72, T 98.2 F (36.8 C), temperature source Oral, RR 21,  height 6' (1.829 m), weight 171 lb 1.2 oz (77.6 kg), SpO2 95 %. HEENT: [place]/AT PEERL, EOMI, mild icterus at baseline. Neck: Trachea midline, no masses, no thyromegal,y no JVD, no carotid bruit OROPHARYNX: Moist, No exudate/ erythema/lesions.  Heart: Regular rate and rhythm, without murmurs or rubs but he does have a loud P2. Pulmonary/Chest: Normal effort. Breath sounds normal. No. Apnea. Clear to auscultation,no stridor,  no wheezing and no rhonchi noted. No respiratory distress and no tenderness noted. Abdomen: Soft, nontender, nondistended, normal bowel sounds, no masses no hepatosplenomegaly noted. No fluid wave and no ascites. There is no guarding or rebound. Neuro: Alert and oriented to person, place and time. Normal motor skills, Displays no atrophy or tremors and exhibits normal muscle tone.  No focal neurological deficits noted cranial nerves II through XII grossly intact. No sensory deficit noted.  Strength at baseline in bilateral upper and lower extremities. Gait normal. Musculoskeletal: No warm swelling or erythema around joints, no spinal tenderness noted. Psychiatric: Patient alert and oriented x3, good insight and cognition, good recent to remote recall. Mood, memory, affect and judgement normal Lymph node survey: No cervical axillary or inguinal lymphadenopathy noted. Skin: Skin is warm and dry. No bruising, no ecchymosis and no rash noted. Pt is not diaphoretic. No erythema. No pallor    Recent Labs  10/30/14 0530  NA 140  K 3.9  CL 112*  CO2 23  GLUCOSE 119*  BUN 12  CREATININE 0.67  CALCIUM 8.6*   No results for input(s): AST, ALT, ALKPHOS, BILITOT, PROT, ALBUMIN in the last 72 hours. No results for input(s): LIPASE, AMYLASE in the last 72 hours.  Recent Labs  10/28/14 0630 10/30/14 0530  WBC 15.8* 13.7*  NEUTROABS 10.8* 8.4*  HGB 7.5* 7.2*  HCT 22.0* 20.7*  MCV 94.4 92.8  PLT 580* 544*     Total time spent including face to face and decision making was  greater than 30 minutes  Signed: [name],[name] 10/30/2014, 9:23 AM

## 2014-10-30 NOTE — Progress Notes (Signed)
SATURATION QUALIFICATIONS: (This note is used to comply with regulatory documentation for home oxygen)  Patient Saturations on Room Air at Rest = 76%  Patient Saturations on Room Air while Ambulating =86%  Patient Saturations on 3 Liters of oxygen while Ambulating = 84%  Please briefly explain why patient needs home oxygen: Sickle cell patient needs oxygen support to ease pain

## 2014-10-30 NOTE — Progress Notes (Signed)
Harding's dilaudid PCA was d/c'ed with 12 mL of 2 mg/mL wastage. Witnessed by Laural Benes, RN

## 2014-10-30 NOTE — Care Management Note (Signed)
Case Management Note  Patient Details  Name: MIKAH POSS MRN: [id number] Date of Birth: [birth year]  Expected Discharge Date:  10/29/14               Expected Discharge Plan:  Home/Self Care  In-House Referral:     Discharge planning Services  CM Consult  Post Acute Care Choice:    Choice offered to:     DME Arranged:    DME Agency:     HH Arranged:    HH Agency:     Status of Service:  In process, will continue to follow  Medicare Important Message Given:  Yes-second notification given Date Medicare IM Given:    Medicare IM give by:    Date Additional Medicare IM Given:    Additional Medicare Important Message give by:     If discussed at [place] of Stay Meetings, dates discussed:    Additional Comments: Pt states he does not have a travel tank to transport home. MD requesting eval for 02 conserving device. Order placed by MD. [facility] called to obtain travel tank and set up eval. CM will continue to follow. Lynnell Catalan, RN 10/30/2014, 1:51 PM

## 2014-11-02 IMAGING — CR DG CHEST 2V
2 series · 2 of 2 positions shown · non-contrast
Comparison: 11/21/2011

CLINICAL DATA: Shortness of breath

CHEST - 2 VIEW

[w chest pa]
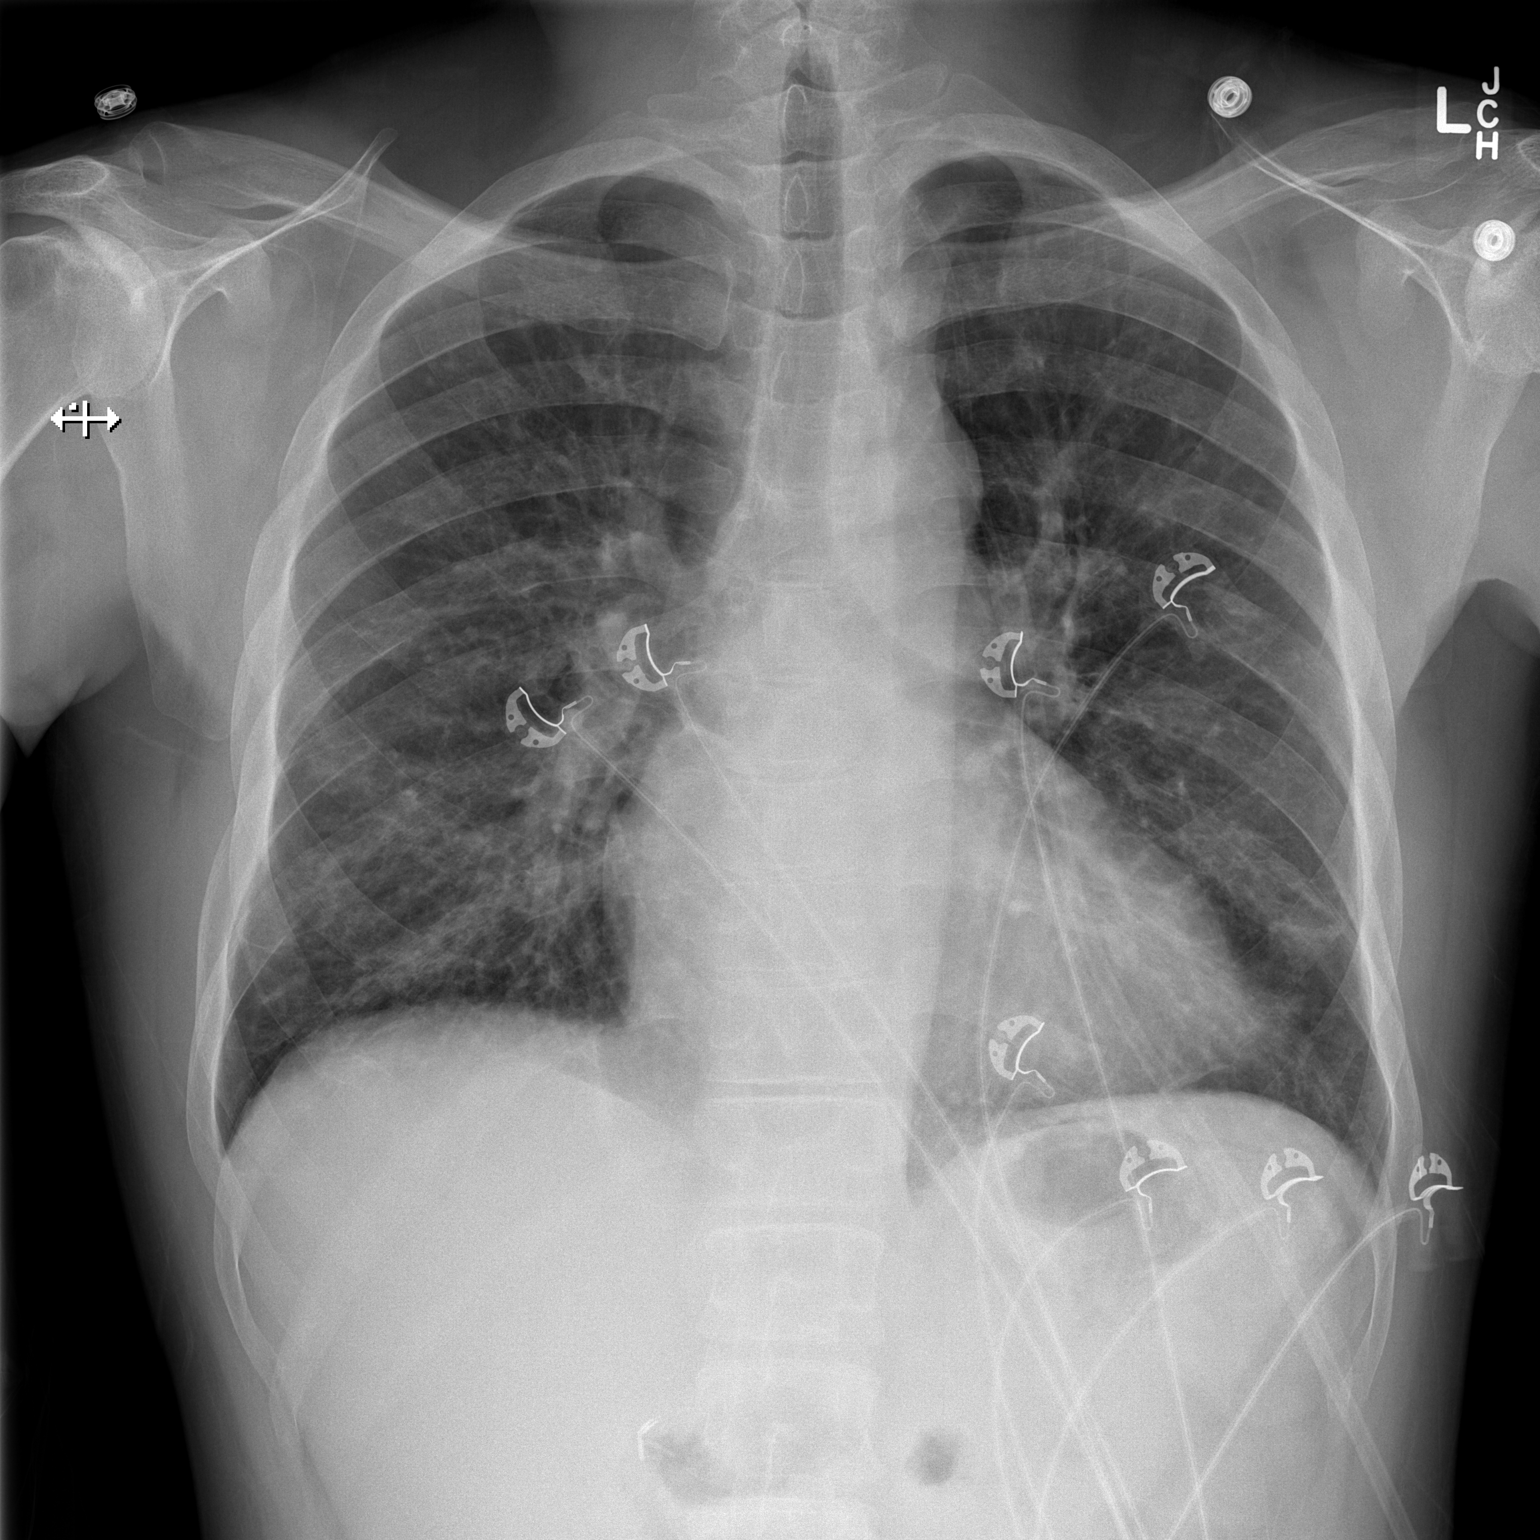

[w chest lat]
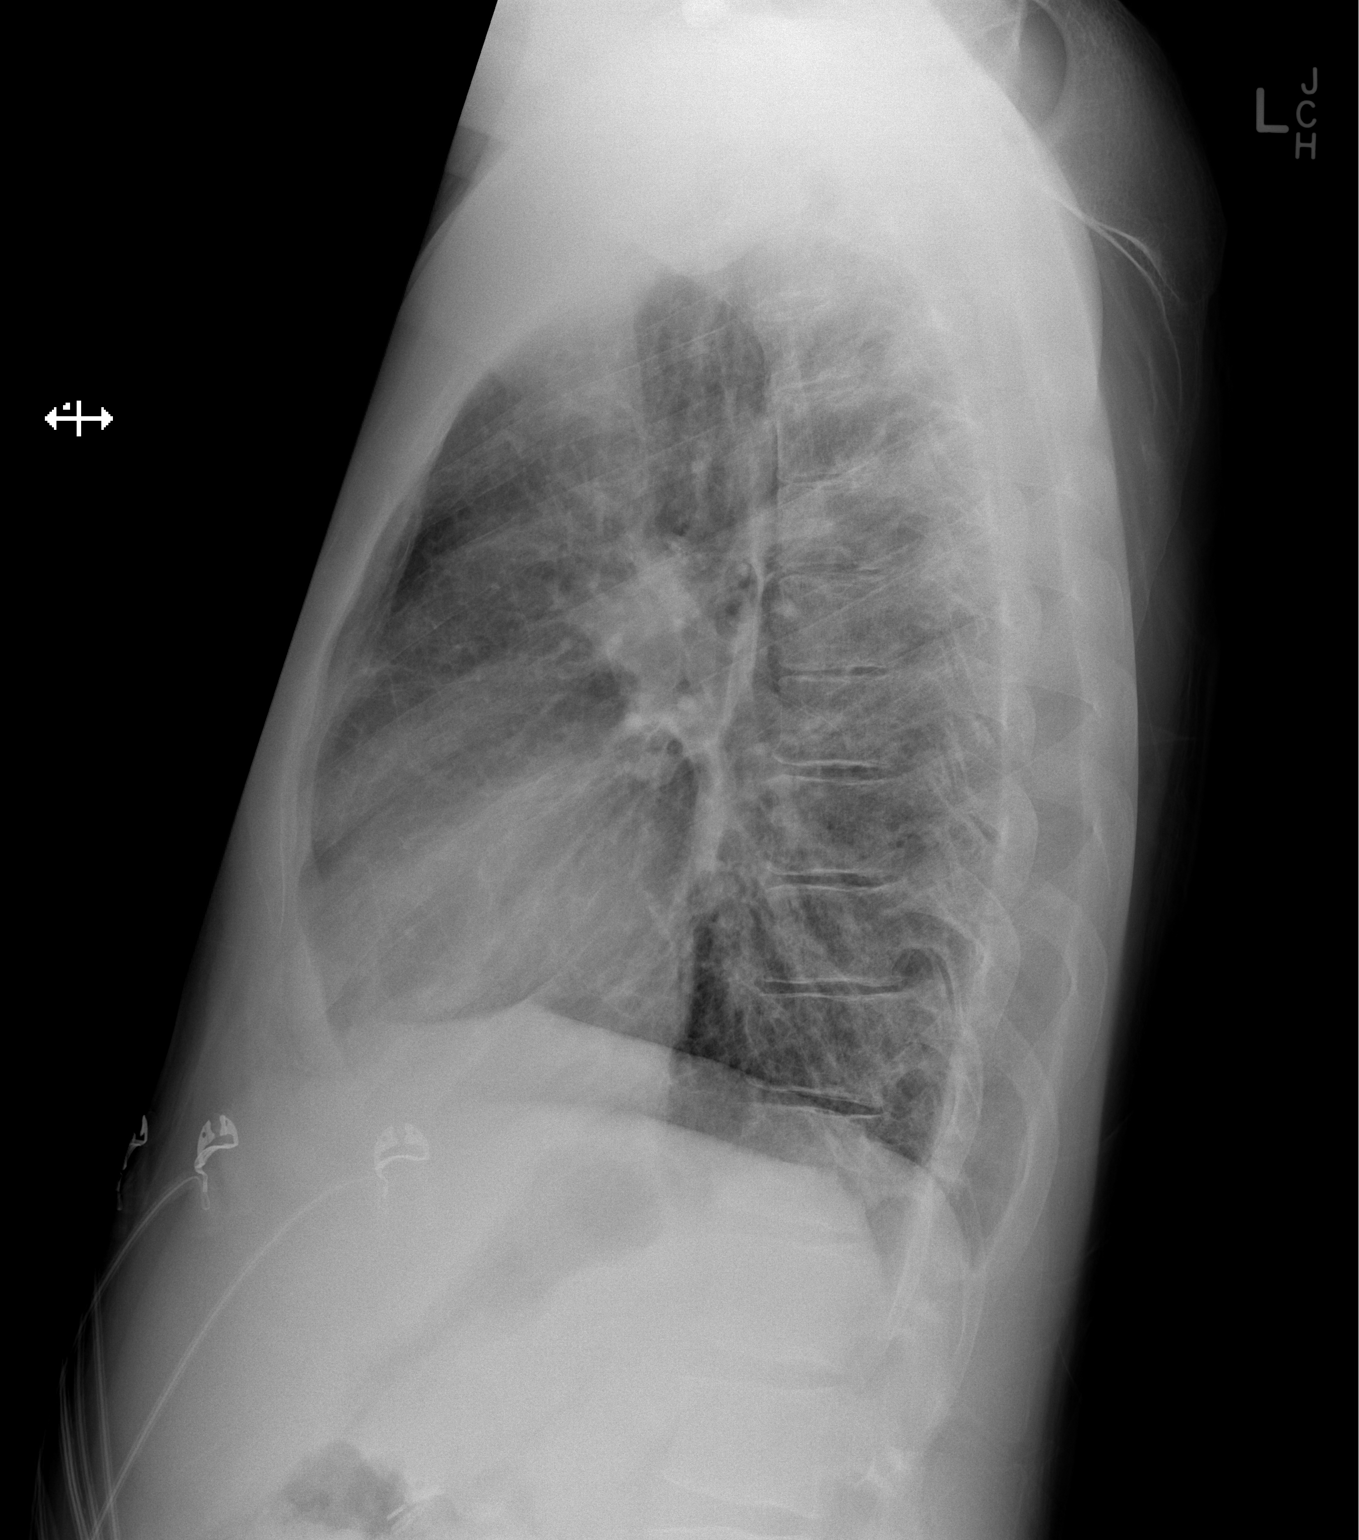

[2 of 2 positions shown; findings below may reference images not displayed]

FINDINGS: Cardiomediastinal silhouette is stable.  Again noted
perihilar mild increase bronchial markings.  Stable bilateral mild
interstitial prominence.  No segmental infiltrate or pulmonary
edema.  Bony thorax is stable.
IMPRESSION: Again noted perihilar mild increase bronchial markings.  Stable
bilateral mild interstitial prominence.  No segmental infiltrate or
pulmonary edema.  Bony thorax is stable.

## 2014-11-03 ENCOUNTER — Telehealth (HOSPITAL_COMMUNITY): Payer: Self-pay | Admitting: *Deleted

## 2014-11-04 ENCOUNTER — Encounter (HOSPITAL_COMMUNITY): Payer: Self-pay | Admitting: Emergency Medicine

## 2014-11-04 ENCOUNTER — Emergency Department (HOSPITAL_COMMUNITY)
Admission: EM | Admit: 2014-11-04 | Discharge: 2014-11-04 | Disposition: A | Discharge status: Home / Self Care | Payer: Medicare Other | Attending: Emergency Medicine | Admitting: Emergency Medicine

## 2014-11-04 DIAGNOSIS — M79604 Pain in right leg: Secondary | ICD-10-CM | POA: Diagnosis not present

## 2014-11-04 DIAGNOSIS — Z86711 Personal history of pulmonary embolism: Secondary | ICD-10-CM | POA: Insufficient documentation

## 2014-11-04 DIAGNOSIS — I10 Essential (primary) hypertension: Secondary | ICD-10-CM | POA: Insufficient documentation

## 2014-11-04 DIAGNOSIS — Z862 Personal history of diseases of the blood and blood-forming organs and certain disorders involving the immune mechanism: Secondary | ICD-10-CM | POA: Diagnosis not present

## 2014-11-04 DIAGNOSIS — Z79899 Other long term (current) drug therapy: Secondary | ICD-10-CM | POA: Insufficient documentation

## 2014-11-04 DIAGNOSIS — Z87891 Personal history of nicotine dependence: Secondary | ICD-10-CM | POA: Insufficient documentation

## 2014-11-04 DIAGNOSIS — R52 Pain, unspecified: Secondary | ICD-10-CM | POA: Insufficient documentation

## 2014-11-04 DIAGNOSIS — R000 Tachycardia, unspecified: Secondary | ICD-10-CM | POA: Diagnosis not present

## 2014-11-04 DIAGNOSIS — E876 Hypokalemia: Secondary | ICD-10-CM | POA: Diagnosis not present

## 2014-11-04 LAB — COMPREHENSIVE METABOLIC PANEL WITH GFR
ALT: 24 U/L (ref 17–63)
AST: 31 U/L (ref 15–41)
Albumin: 3.9 g/dL (ref 3.5–5.0)
Alkaline Phosphatase: 88 U/L (ref 38–126)
Anion gap: 7 (ref 5–15)
BUN: 6 mg/dL (ref 6–20)
CO2: 24 mmol/L (ref 22–32)
Calcium: 8.8 mg/dL — ABNORMAL LOW (ref 8.9–10.3)
Chloride: 107 mmol/L (ref 101–111)
Creatinine, Ser: 0.54 mg/dL — ABNORMAL LOW (ref 0.61–1.24)
GFR calc Af Amer: >60 mL/min
GFR calc non Af Amer: >60 mL/min
Glucose, Bld: 103 mg/dL — ABNORMAL HIGH (ref 65–99)
Potassium: 3.5 mmol/L (ref 3.5–5.1)
Sodium: 138 mmol/L (ref 135–145)
Total Bilirubin: 4.3 mg/dL — ABNORMAL HIGH (ref 0.3–1.2)
Total Protein: 7.3 g/dL (ref 6.5–8.1)

## 2014-11-04 LAB — RETICULOCYTES
RBC.: 2.46 MIL/uL — ABNORMAL LOW (ref 4.22–5.81)
Retic Count, Absolute: 516.6 K/uL — ABNORMAL HIGH (ref 19.0–186.0)
Retic Ct Pct: 21 % — ABNORMAL HIGH (ref 0.4–3.1)

## 2014-11-04 LAB — CBC WITH DIFFERENTIAL/PLATELET
Basophils Absolute: 0.2 K/uL — ABNORMAL HIGH (ref 0.0–0.1)
Basophils Relative: 1 %
Eosinophils Absolute: 0.3 K/uL (ref 0.0–0.7)
Eosinophils Relative: 2 %
HCT: 23.1 % — ABNORMAL LOW (ref 39.0–52.0)
Hemoglobin: 7.8 g/dL — ABNORMAL LOW (ref 13.0–17.0)
Lymphocytes Relative: 10 %
Lymphs Abs: 1.5 K/uL (ref 0.7–4.0)
MCH: 31.7 pg (ref 26.0–34.0)
MCHC: 33.8 g/dL (ref 30.0–36.0)
MCV: 93.9 fL (ref 78.0–100.0)
Monocytes Absolute: 1.7 K/uL — ABNORMAL HIGH (ref 0.1–1.0)
Monocytes Relative: 11 %
Neutro Abs: 11.7 K/uL — ABNORMAL HIGH (ref 1.7–7.7)
Neutrophils Relative %: 76 %
Platelets: 531 K/uL — ABNORMAL HIGH (ref 150–400)
RBC: 2.46 MIL/uL — ABNORMAL LOW (ref 4.22–5.81)
RDW: 21.6 % — ABNORMAL HIGH (ref 11.5–15.5)
WBC: 15.4 K/uL — ABNORMAL HIGH (ref 4.0–10.5)
nRBC: 3 /100{WBCs} — ABNORMAL HIGH

## 2014-11-04 MED ORDER — HEPARIN SOD (PORK) LOCK FLUSH 100 UNIT/ML IV SOLN
500.0000 [IU] | Freq: Once | INTRAVENOUS | Status: AC
  Administered 2014-11-04: 500 [IU]
  Filled 2014-11-04: qty 5

## 2014-11-04 MED ORDER — HYDROMORPHONE HCL 2 MG/ML IJ SOLN
2.0000 mg | INTRAMUSCULAR | Status: AC
  Administered 2014-11-04: 2 mg via INTRAVENOUS
  Filled 2014-11-04: qty 1

## 2014-11-04 MED ORDER — HYDROMORPHONE HCL 2 MG/ML IJ SOLN: 0.0250 mg/kg | INTRAMUSCULAR | Status: AC

## 2014-11-04 MED ORDER — HYDROMORPHONE HCL 2 MG/ML IJ SOLN: 2.0000 mg | Freq: Once | INTRAMUSCULAR | Status: DC

## 2014-11-04 MED ORDER — DIPHENHYDRAMINE HCL 50 MG/ML IJ SOLN
25.0000 mg | Freq: Once | INTRAMUSCULAR | Status: AC
  Administered 2014-11-04: 25 mg via INTRAVENOUS
  Filled 2014-11-04: qty 1

## 2014-11-04 MED ORDER — KETOROLAC TROMETHAMINE 30 MG/ML IJ SOLN
30.0000 mg | INTRAMUSCULAR | Status: AC
  Administered 2014-11-04: 30 mg via INTRAVENOUS
  Filled 2014-11-04: qty 1

## 2014-11-04 NOTE — ED Notes (Signed)
Per pt, states B/L rib and leg pain that started last night

## 2014-11-04 NOTE — Discharge Instructions (Signed)
As discussed, it is important that you follow up as soon as possible with your physician for continued management of your condition. ° °If you develop any new, or concerning changes in your condition, please return to the emergency department immediately. ° °

## 2014-11-04 NOTE — ED Provider Notes (Signed)
CSN: [id number]     Arrival date & time 11/04/14  2952 History   First MD Initiated Contact with Patient 11/04/14 [phone]     Chief Complaint  Patient presents with  . rib/lower extremity pain     HPI  Patient presents with concern of abdominal pain, right leg pain. This pain began within the past 12 hours, has not responded to typical home narcotic dosing. Pain is sore, moderate. No new chest pain, dyspnea, fever, chills. Patient was discharged last week after an admission for acute chest syndrome. He recovered very well, until today. No other complaints, no other new changes.   Past Medical History  Diagnosis Date  . Sickle cell anemia (HCC)   . Blood transfusion   . Acute embolism and thrombosis of right internal jugular vein ([place])   . Hypokalemia   . Mood disorder ([place])   . History of pulmonary embolus (PE)   . Avascular necrosis ([place])   . Leukocytosis     Chronic  . Thrombocytosis (HCC)     Chronic  . Hypertension   . History of Clostridium difficile infection   . Uses marijuana   . Chronic anticoagulation   . Functional asplenia   . Former smoker   . Second hand tobacco smoke exposure   . Alcohol consumption of one to four drinks per day   . Noncompliance with medication regimen   . Sickle-cell crisis with associated acute chest syndrome ([place]) 05/13/2013  . Acute chest syndrome ([place]) 06/18/2013  . Demand ischemia ([place]) 01/02/2014   Past Surgical History  Procedure Laterality Date  . Right hip replacement      08/2006  . Cholecystectomy      01/2008  . Porta cath placement    . Porta cath removal    . Umbilical hernia repair      01/2008  . Excision of left periauricular cyst      10/2009  . Excision of right ear lobe cyst with primary closur      11/2007  . Portacath placement  01/05/2012    Procedure: INSERTION PORT-A-CATH;  Surgeon: Odis Hollingshead, MD;  Location: [facility];  Service: General;  Laterality: N/A;  ultrasound guiced port a cath insertion with  fluoroscopy   Family History  Problem Relation Age of Onset  . Sickle cell trait Mother   . Depression Mother   . Diabetes Mother   . Sickle cell trait Father   . Sickle cell trait Brother    Social History  Substance Use Topics  . Smoking status: Former Smoker -- 13 years    Quit date: 07/08/2010  . Smokeless tobacco: Never Used  . Alcohol Use: No    Review of Systems  Constitutional:       Per HPI, otherwise negative  HENT:       Per HPI, otherwise negative  Respiratory: Negative for cough, choking, chest tightness, shortness of breath and wheezing.   Cardiovascular:       Per HPI, otherwise negative  Gastrointestinal: Negative for vomiting.  Endocrine:       Negative aside from HPI  Genitourinary:       Neg aside from HPI   Musculoskeletal:       Per HPI, otherwise negative  Skin: Negative.   Neurological: Negative for syncope.      Allergies  Review of patient's allergies indicates no known allergies.  Home Medications   Prior to Admission medications   Medication Sig Start Date End Date  Taking? Authorizing Provider  Cholecalciferol (VITAMIN D) 2000 UNITS tablet Take 1 tablet (2,000 Units total) by mouth daily. 02/13/14  Yes [name], MD  diltiazem (CARDIZEM CD) 120 MG 24 hr capsule Take 1 capsule (120 mg total) by mouth daily. 03/13/14  Yes Costin Karlyne Greenspan, MD  enoxaparin (LOVENOX) 150 MG/ML injection Inject 0.73 mLs (110 mg total) into the skin daily. 09/26/14  Yes Micheline Chapman, NP  folic acid (FOLVITE) 1 MG tablet Take 1 tablet (1 mg total) by mouth every morning. 10/08/13  Yes [name], MD  HYDROmorphone (DILAUDID) 4 MG tablet Take 1 tablet (4 mg total) by mouth every 4 (four) hours as needed for severe pain. 10/07/14  Yes Tresa Garter, MD  lisinopril (PRINIVIL,ZESTRIL) 10 MG tablet Take 1 tablet (10 mg total) by mouth daily. 01/22/14  Yes [name], MD  metoprolol tartrate (LOPRESSOR) 25 MG tablet Take 1 tablet (25 mg  total) by mouth daily. 08/22/13  Yes [name], MD  morphine (MS CONTIN) 30 MG 12 hr tablet Take 1 tablet (30 mg total) by mouth every 12 (twelve) hours. 10/22/14  Yes Dorena Dew, FNP  potassium chloride SA (K-DUR,KLOR-CON) 20 MEQ tablet Take 1 tablet (20 mEq total) by mouth every morning. 06/09/14  Yes [name], MD  zolpidem (AMBIEN) 10 MG tablet Take 1 tablet (10 mg total) by mouth at bedtime as needed for sleep. 10/23/14  Yes Tresa Garter, MD  aspirin 81 MG chewable tablet Chew 1 tablet (81 mg total) by mouth daily. Patient not taking: Reported on 11/04/2014 07/23/14   [name], MD   BP 121/83 mmHg  Pulse 101  Temp(Src) 98.4 F (36.9 C) (Oral)  Resp 18  SpO2 95% Physical Exam  Constitutional: He is oriented to person, place, and time. He appears well-developed. No distress.  HENT:  Head: Normocephalic and atraumatic.  Eyes: Conjunctivae and EOM are normal.  Cardiovascular: Normal rate and regular rhythm.   Pulmonary/Chest: Effort normal. No stridor. No respiratory distress.  Abdominal: He exhibits no distension. There is no tenderness.  Musculoskeletal: He exhibits no edema.  Neurological: He is alert and oriented to person, place, and time.  Skin: Skin is warm and dry.  Psychiatric: He has a normal mood and affect.  Nursing note and vitals reviewed.   ED Course  Procedures (including critical care time) Labs Review Labs Reviewed  RETICULOCYTES  COMPREHENSIVE METABOLIC PANEL  URINALYSIS, ROUTINE W REFLEX MICROSCOPIC (NOT AT [facility])  CBC WITH DIFFERENTIAL/PLATELET    Imaging Review No results found. I have personally reviewed and evaluated these images and lab results as part of my medical decision-making.   chart reviewed history of multiple visits to the ED for a variety of pain complaints, recent admission for acute chest syndrome.  Here patient is saturating 97% on room air normal   MDM  Young male with sickle cell disease  presents with belly, leg pain. Patient's abdomen is soft, non-tender, non-peritoneal, no evidence for distress, ongoing infection. Minimal tachycardia likely secondary to pain. Pain well-controlled here, labs reassuring, patient discharged in stable condition.  Carmin Muskrat, MD 11/04/14 [phone]

## 2014-11-05 ENCOUNTER — Non-Acute Institutional Stay (HOSPITAL_COMMUNITY)
Admission: AD | Admit: 2014-11-05 | Discharge: 2014-11-05 | Disposition: A | Discharge status: Home / Self Care | Payer: Medicare Other | Attending: Internal Medicine | Admitting: Internal Medicine

## 2014-11-05 ENCOUNTER — Encounter (HOSPITAL_COMMUNITY): Payer: Self-pay

## 2014-11-05 ENCOUNTER — Telehealth (HOSPITAL_COMMUNITY): Payer: Self-pay

## 2014-11-05 DIAGNOSIS — Z96641 Presence of right artificial hip joint: Secondary | ICD-10-CM | POA: Diagnosis not present

## 2014-11-05 DIAGNOSIS — Z87891 Personal history of nicotine dependence: Secondary | ICD-10-CM | POA: Diagnosis not present

## 2014-11-05 DIAGNOSIS — D5700 Hb-SS disease with crisis, unspecified: Secondary | ICD-10-CM | POA: Diagnosis not present

## 2014-11-05 DIAGNOSIS — R0781 Pleurodynia: Secondary | ICD-10-CM | POA: Diagnosis present

## 2014-11-05 DIAGNOSIS — Z7722 Contact with and (suspected) exposure to environmental tobacco smoke (acute) (chronic): Secondary | ICD-10-CM | POA: Diagnosis not present

## 2014-11-05 DIAGNOSIS — I10 Essential (primary) hypertension: Secondary | ICD-10-CM | POA: Insufficient documentation

## 2014-11-05 DIAGNOSIS — Z7901 Long term (current) use of anticoagulants: Secondary | ICD-10-CM | POA: Insufficient documentation

## 2014-11-05 DIAGNOSIS — Z86711 Personal history of pulmonary embolism: Secondary | ICD-10-CM | POA: Diagnosis not present

## 2014-11-05 DIAGNOSIS — Z79899 Other long term (current) drug therapy: Secondary | ICD-10-CM | POA: Insufficient documentation

## 2014-11-05 DIAGNOSIS — Z79891 Long term (current) use of opiate analgesic: Secondary | ICD-10-CM | POA: Diagnosis not present

## 2014-11-05 DIAGNOSIS — Z7982 Long term (current) use of aspirin: Secondary | ICD-10-CM | POA: Insufficient documentation

## 2014-11-05 DIAGNOSIS — Z86718 Personal history of other venous thrombosis and embolism: Secondary | ICD-10-CM | POA: Insufficient documentation

## 2014-11-05 LAB — CBC WITH DIFFERENTIAL/PLATELET
BASOS PCT: 0 %
Basophils Absolute: 0 10*3/uL (ref 0.0–0.1)
EOS ABS: 0.5 10*3/uL (ref 0.0–0.7)
Eosinophils Relative: 3 %
HCT: 23.1 % — ABNORMAL LOW (ref 39.0–52.0)
HEMOGLOBIN: 8 g/dL — AB (ref 13.0–17.0)
LYMPHS PCT: 14 %
Lymphs Abs: 2.4 10*3/uL (ref 0.7–4.0)
MCH: 32.5 pg (ref 26.0–34.0)
MCHC: 34.6 g/dL (ref 30.0–36.0)
MCV: 93.9 fL (ref 78.0–100.0)
Monocytes Absolute: 2.4 10*3/uL — ABNORMAL HIGH (ref 0.1–1.0)
Monocytes Relative: 14 %
NEUTROS PCT: 69 %
Neutro Abs: 11.7 10*3/uL — ABNORMAL HIGH (ref 1.7–7.7)
Platelets: 572 10*3/uL — ABNORMAL HIGH (ref 150–400)
RBC: 2.46 MIL/uL — ABNORMAL LOW (ref 4.22–5.81)
RDW: 20.1 % — ABNORMAL HIGH (ref 11.5–15.5)
WBC: 17 10*3/uL — ABNORMAL HIGH (ref 4.0–10.5)

## 2014-11-05 IMAGING — US IR US GUIDE VASC ACCESS RIGHT
1 series · 1 of 1 positions shown · non-contrast
Comparison: none

CLINICAL DATA: Sickle cell crisis, poor venous access.  Request is
made for central venous access for fluids and medications.

[Series 1: ir fluoro guide cv line*right* · 1 of 1 slices shown]
[im 1/1]
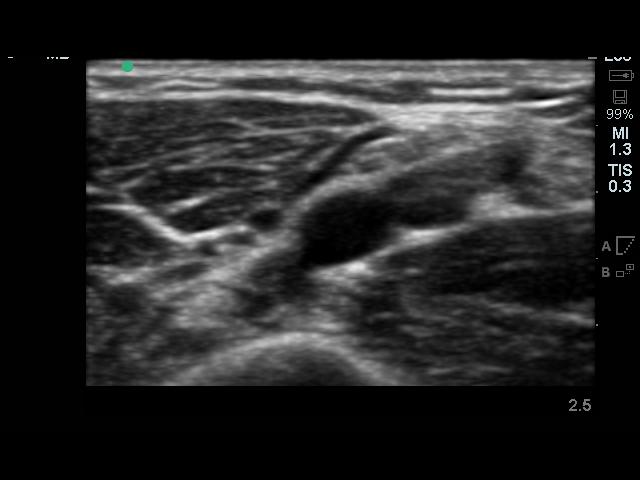

[1 of 1 positions shown; findings below may reference images not displayed]

PICC LINE PLACEMENT WITH ULTRASOUND AND FLUOROSCOPIC  GUIDANCE

Fluoroscopy Time: 0.3 minutes.

The right arm was prepped with chlorhexidine, draped in the usual
sterile fashion using maximum barrier technique (cap and mask,
sterile gown, sterile gloves, large sterile sheet, hand hygiene and
cutaneous antisepsis) and infiltrated locally with 1% Lidocaine.

Ultrasound demonstrated patency of the right brachial vein, and
this was documented with an image.  Under real-time ultrasound
guidance, this vein was accessed with a 21 gauge micropuncture
needle and image documentation was performed.  The needle was
exchanged over a guidewire for a peel-away sheath through which a
five French double lumen PICC trimmed to 42 cm was advanced,
positioned with its tip at the lower SVC/right atrial junction.
Fluoroscopy during the procedure and fluoro spot radiograph
confirms appropriate catheter position.  The catheter was flushed,
secured to the skin with Prolene sutures, and covered with a
sterile dressing.

Complications:  None
IMPRESSION: Successful right arm PICC line placement with ultrasound and
fluoroscopic guidance.  The catheter is ready for use.

## 2014-11-05 MED ORDER — SODIUM CHLORIDE 0.9 % IJ SOLN
10.0000 mL | INTRAMUSCULAR | Status: AC | PRN
  Administered 2014-11-05: 10 mL

## 2014-11-05 MED ORDER — ONDANSETRON HCL 4 MG/2ML IJ SOLN: 4.0000 mg | Freq: Four times a day (QID) | INTRAMUSCULAR | Status: DC | PRN

## 2014-11-05 MED ORDER — DIPHENHYDRAMINE HCL 12.5 MG/5ML PO ELIX
12.5000 mg | ORAL_SOLUTION | Freq: Four times a day (QID) | ORAL | Status: DC | PRN
  Administered 2014-11-05: 12.5 mg via ORAL
  Filled 2014-11-05: qty 5

## 2014-11-05 MED ORDER — NALOXONE HCL 0.4 MG/ML IJ SOLN: 0.4000 mg | INTRAMUSCULAR | Status: DC | PRN

## 2014-11-05 MED ORDER — KETOROLAC TROMETHAMINE 30 MG/ML IJ SOLN
30.0000 mg | Freq: Once | INTRAMUSCULAR | Status: AC
  Administered 2014-11-05: 30 mg via INTRAVENOUS
  Filled 2014-11-05: qty 1

## 2014-11-05 MED ORDER — SODIUM CHLORIDE 0.9 % IV SOLN
12.5000 mg | Freq: Four times a day (QID) | INTRAVENOUS | Status: DC | PRN
  Filled 2014-11-05: qty 0.25

## 2014-11-05 MED ORDER — HYDROMORPHONE 2 MG/ML HIGH CONCENTRATION IV PCA SOLN
INTRAVENOUS | Status: DC
  Administered 2014-11-05: 16.8 mg via INTRAVENOUS
  Administered 2014-11-05: 10:00:00 via INTRAVENOUS
  Filled 2014-11-05: qty 25

## 2014-11-05 MED ORDER — DEXTROSE-NACL 5-0.45 % IV SOLN
INTRAVENOUS | Status: DC
  Administered 2014-11-05: 10:00:00 via INTRAVENOUS

## 2014-11-05 MED ORDER — SODIUM CHLORIDE 0.9 % IJ SOLN: 9.0000 mL | INTRAMUSCULAR | Status: DC | PRN

## 2014-11-05 MED ORDER — HYDROMORPHONE HCL 4 MG PO TABS
4.0000 mg | ORAL_TABLET | Freq: Once | ORAL | Status: AC
  Administered 2014-11-05: 4 mg via ORAL
  Filled 2014-11-05: qty 1

## 2014-11-05 MED ORDER — HEPARIN SOD (PORK) LOCK FLUSH 100 UNIT/ML IV SOLN
500.0000 [IU] | INTRAVENOUS | Status: AC | PRN
  Administered 2014-11-05: 500 [IU]
  Filled 2014-11-05: qty 5

## 2014-11-05 NOTE — Telephone Encounter (Signed)
Patient called complaining of leg pain 9/10. Denies fever, chest pain, nausea/ vomiting, SOB, and abdominal pain. Would like to come to day hospital for treatment. Told I would call back after speaking with provider. Patient verbalizes understanding.

## 2014-11-05 NOTE — Progress Notes (Signed)
Pt received to the [facility] for treatment for c/o sickle cell pain. Pt's pain started out  At 9/10 on admission. Pt was put on a Dilaudid PCA and IV fluids. Pt tolerated well. Pain now down to 3. PCA and IV fluids d/c'd. Pt advised to keep appt for tomorrow and cont to drink plenty of fluids. Discharged to home.

## 2014-11-05 NOTE — Telephone Encounter (Signed)
After speaking with NP Smith Robert, returned patient phone call and told him he could report to the day hospital for treatment.Patient verbalizes understanding. States he will be here within the hour.

## 2014-11-05 NOTE — H&P (Signed)
[facility] History and Physical   Date: 11/05/2014  Patient name: [name] Medical record number: [id number] Date of birth: 1980/03/28 Age: 34 y.o. Gender: male PCP: [name], MD  Attending physician: Tresa Garter, MD  Chief Complaint: Rib pain  History of Present Illness: [name], a 34 year old male with a history of sickle cell anemia, HbSS presents with a 3 day history of left rib pain and bilateral lower extremity pain consistent with sickle cell anemia. Patient states that he was evaluated in the emergency department on 11/05/2014 and discharged home. . Patient has had frequent emergency department visits and days hospital admissions over the past several weeks. He was recently taken off of hydroxyurea therapy. He states that current pain intensity is 9/10 unrelieved by Dilaudid 4 mg last taken at 6 am. He describes pain as constant and throbbing. [name] reports headache, but denies fatigue, shortness of breath, chest pain, abdominal pain, nausea, vomiting, and diarrhea.   Meds: Prescriptions prior to admission  Medication Sig Dispense Refill Last Dose  . aspirin 81 MG chewable tablet Chew 1 tablet (81 mg total) by mouth daily. (Patient not taking: Reported on 11/04/2014) 30 tablet 11 Not Taking at Unknown time  . Cholecalciferol (VITAMIN D) 2000 UNITS tablet Take 1 tablet (2,000 Units total) by mouth daily. 90 tablet 3 11/03/2014 at Unknown time  . diltiazem (CARDIZEM CD) 120 MG 24 hr capsule Take 1 capsule (120 mg total) by mouth daily. 30 capsule 1 11/03/2014 at Unknown time  . enoxaparin (LOVENOX) 150 MG/ML injection Inject 0.73 mLs (110 mg total) into the skin daily. 22 Syringe 5 11/03/2014 at 0930  . folic acid (FOLVITE) 1 MG tablet Take 1 tablet (1 mg total) by mouth every morning. 30 tablet 11 11/03/2014 at Unknown time  . HYDROmorphone (DILAUDID) 4 MG tablet Take 1 tablet (4 mg total) by mouth every 4 (four) hours as needed  for severe pain. 90 tablet 0 Past Week at Unknown time  . lisinopril (PRINIVIL,ZESTRIL) 10 MG tablet Take 1 tablet (10 mg total) by mouth daily. 30 tablet 1 11/03/2014 at Unknown time  . metoprolol tartrate (LOPRESSOR) 25 MG tablet Take 1 tablet (25 mg total) by mouth daily. 30 tablet 6 11/03/2014 at 0930  . morphine (MS CONTIN) 30 MG 12 hr tablet Take 1 tablet (30 mg total) by mouth every 12 (twelve) hours. 60 tablet 0 11/03/2014 at Unknown time  . potassium chloride SA (K-DUR,KLOR-CON) 20 MEQ tablet Take 1 tablet (20 mEq total) by mouth every morning. 30 tablet 3 11/03/2014 at Unknown time  . zolpidem (AMBIEN) 10 MG tablet Take 1 tablet (10 mg total) by mouth at bedtime as needed for sleep. 30 tablet 0 Past Week at Unknown time    Allergies: Review of patient's allergies indicates no known allergies. Past Medical History  Diagnosis Date  . Sickle cell anemia (HCC)   . Blood transfusion   . Acute embolism and thrombosis of right internal jugular vein ([place])   . Hypokalemia   . Mood disorder ([place])   . History of pulmonary embolus (PE)   . Avascular necrosis ([place])   . Leukocytosis     Chronic  . Thrombocytosis (HCC)     Chronic  . Hypertension   . History of Clostridium difficile infection   . Uses marijuana   . Chronic anticoagulation   . Functional asplenia   . Former smoker   . Second hand tobacco smoke exposure   .  Alcohol consumption of one to four drinks per day   . Noncompliance with medication regimen   . Sickle-cell crisis with associated acute chest syndrome ([place]) 05/13/2013  . Acute chest syndrome ([place]) 06/18/2013  . Demand ischemia ([place]) 01/02/2014   Past Surgical History  Procedure Laterality Date  . Right hip replacement      08/2006  . Cholecystectomy      01/2008  . Porta cath placement    . Porta cath removal    . Umbilical hernia repair      01/2008  . Excision of left periauricular cyst      10/2009  . Excision of right ear lobe cyst with primary closur       11/2007  . Portacath placement  01/05/2012    Procedure: INSERTION PORT-A-CATH;  Surgeon: Odis Hollingshead, MD;  Location: [facility];  Service: General;  Laterality: N/A;  ultrasound guiced port a cath insertion with fluoroscopy   Family History  Problem Relation Age of Onset  . Sickle cell trait Mother   . Depression Mother   . Diabetes Mother   . Sickle cell trait Father   . Sickle cell trait Brother    Social History   Social History  . Marital Status: Single    Spouse Name: N/A  . Number of Children: 0  . Years of Education: 13   Occupational History  . 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 Smoking status: Former Smoker -- 13 years    Quit date: 07/08/2010  . Smokeless tobacco: Never Used  . Alcohol Use: No  . Drug Use: 2.00 per week    Special: Marijuana  . Sexual Activity:    Partners: Female    Museum/gallery curator: None     Comment: month ago   Other Topics Concern  . Not on file   Social History Narrative   Lives in an apartment.  Single.  Lives alone but has a girlfriend that helps care for him.  Does not use any assist devices.        [name]:  [phone] Mom, emergency contact    Review of Systems: Constitutional: negative for fatigue, fevers and night sweats Eyes: positive for icterus Ears, nose, mouth, throat, and face: negative Respiratory: negative for cough and dyspnea on exertion Cardiovascular: negative for dyspnea, irregular heart beat and palpitations Gastrointestinal: negative for abdominal pain, constipation, diarrhea and nausea Genitourinary:negative Integument/breast: negative Hematologic/lymphatic: negative Musculoskeletal:positive for myalgias Neurological: negative for gait problems, speech problems, vertigo and weakness Behavioral/Psych: negative Endocrine: negative Allergic/Immunologic: negative  Physical Exam: There were no vitals taken for this  visit. There were no vitals taken for this visit.  General Appearance:    Alert, cooperative, moderate distress, appears stated age  Head:    Normocephalic, without obvious abnormality, atraumatic  Eyes:    PERRL, conjunctiva/corneas clear, EOM's intact, fundi    benign, both eyes, conjunctiva icteric      Ears:    Normal TM's and external ear canals, both ears  Nose:   Nares normal, septum midline, mucosa normal, no drainage    or sinus tenderness  Throat:   Lips, mucosa, and tongue normal; teeth and gums normal  Neck:   Supple, symmetrical, trachea midline, no adenopathy;       thyroid:  No enlargement/tenderness/nodules; no carotid   bruit or JVD  Back:     Symmetric, no curvature, ROM  normal, no CVA tenderness  Lungs:     Clear to auscultation bilaterally, respirations unlabored  Chest wall:    Mild  Tenderness to left chest No deformities noted  Heart:    Regular rate and rhythm, S1 and S2 normal, no murmur, rub   or gallop  Abdomen:     Soft, non-tender, bowel sounds active all four quadrants,    no masses, no organomegaly  Extremities:   Extremities normal, atraumatic, no cyanosis or edema  Pulses:   2+ and symmetric all extremities  Skin:   Skin color, texture, turgor normal, no rashes or lesions  Lymph nodes:   Cervical, Tender supraclavicular node  Neurologic:   CNII-XII intact. Normal strength, sensation and reflexes      throughout    Lab results: No results found. However, due to the size of the patient record, not all encounters were searched. Please check Results Review for a complete set of results.  Imaging results:  No results found.   Assessment & Plan: -Admitted to the day infusion center for extended observation -D5.45 % saline at 125 ml/ hour for cellular rehydration -Patient started on Toradol 30 mg IV for inflammation -Started high concentration PCA per weight based protocol for pain control. Will admit to inpatient services if pain intensity remains  greater than 7/10 -Monitor vitals closely. Patient will be re-evaluated for pain in the context of function and relationship to baseline as care progresses -Reviewed laboratory values, will add an LDH  Delayza Lungren M 11/05/2014, 9:30 AM

## 2014-11-05 NOTE — Discharge Summary (Signed)
[facility] Discharge Summary   Patient ID: KOUA DEEG MRN: [id number] DOB/AGE: [birth year] 34 y.o.  Admit date: 11/05/2014 Discharge date: 11/05/2014  Primary Care Physician:  [name], MD  Admission Diagnoses:  Active Problems:   Hb-SS disease with crisis [facility])   Discharge Medications:    Medication List    ASK your doctor about these medications        aspirin 81 MG chewable tablet  Chew 1 tablet (81 mg total) by mouth daily.     diltiazem 120 MG 24 hr capsule  Commonly known as:  CARDIZEM CD  Take 1 capsule (120 mg total) by mouth daily.     enoxaparin 150 MG/ML injection  Commonly known as:  LOVENOX  Inject 0.73 mLs (110 mg total) into the skin daily.     folic acid 1 MG tablet  Commonly known as:  FOLVITE  Take 1 tablet (1 mg total) by mouth every morning.     HYDROmorphone 4 MG tablet  Commonly known as:  DILAUDID  Take 1 tablet (4 mg total) by mouth every 4 (four) hours as needed for severe pain.     lisinopril 10 MG tablet  Commonly known as:  PRINIVIL,ZESTRIL  Take 1 tablet (10 mg total) by mouth daily.     metoprolol tartrate 25 MG tablet  Commonly known as:  LOPRESSOR  Take 1 tablet (25 mg total) by mouth daily.     morphine 30 MG 12 hr tablet  Commonly known as:  MS CONTIN  Take 1 tablet (30 mg total) by mouth every 12 (twelve) hours.     potassium chloride SA 20 MEQ tablet  Commonly known as:  K-DUR,KLOR-CON  Take 1 tablet (20 mEq total) by mouth every morning.     Vitamin D 2000 UNITS tablet  Take 1 tablet (2,000 Units total) by mouth daily.     zolpidem 10 MG tablet  Commonly known as:  AMBIEN  Take 1 tablet (10 mg total) by mouth at bedtime as needed for sleep.         Consults:  None  Significant Diagnostic Studies:  Dg Chest 2 View  10/26/2014   CLINICAL DATA:  Chronic sickle cell pain  EXAM: CHEST  2 VIEW  COMPARISON:  04/12/2014  FINDINGS: Cardiomegaly.  No frank interstitial edema.  Mild  patchy right basilar/right lower lobe opacity, atelectasis versus pneumonia.  No pleural effusion or pneumothorax.  Left chest power port terminating at the cavoatrial junction.  IMPRESSION: Mild patchy right lower lobe opacity, atelectasis versus pneumonia.  Cardiomegaly.  No frank interstitial edema.   Electronically Signed   By: Julian Hy M.D.   On: 10/26/2014 07:40   Ct Angio Chest Pe W/cm &/or Wo Cm  10/26/2014   CLINICAL DATA:  Sickle cell crisis.  Chest pain.  Low O2 sats.  EXAM: CT ANGIOGRAPHY CHEST WITH CONTRAST  TECHNIQUE: Multidetector CT imaging of the chest was performed using the standard protocol during bolus administration of intravenous contrast. Multiplanar CT image reconstructions and MIPs were obtained to evaluate the vascular anatomy.  CONTRAST:  18mL OMNIPAQUE IOHEXOL 350 MG/ML SOLN  COMPARISON:  09/10/2014.  Chest x-ray earlier today.  FINDINGS: There is cardiomegaly. Aorta is normal caliber. Mildly enlarged anterior mediastinal/prevascular lymph nodes. Index node has a short axis diameter of 11 mm, stable. No hilar or axillary adenopathy.  Previously seen lower lobe pulmonary emboli not visualized on today's study. For no confluent airspace opacity right that linear areas  of scarring in the lung bases. No effusions or confluent airspace opacities.  Left chest wall Port-A-Cath remains in place, unchanged. Chest wall soft tissues otherwise unremarkable. Imaging into the upper abdomen shows no acute findings. 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  No acute bony abnormality or focal bone lesion.  Review of the MIP images confirms the above findings.  IMPRESSION: Stable cardiomegaly and anterior mediastinal adenopathy.  Previously seen pulmonary emboli no longer visualized. Scarring in the lung bases.  No acute findings.   Electronically Signed   By: Rolm Baptise M.D.   On: 10/26/2014 09:54     [facility] Course: -Mr. [place]  was admitted to the day infusion center for extended observation.  -Was administered Toradol 30 mg for inflammation -Started D5.45 for cellular re-hydration -Reviewed laboratory values, consistent with baseline -High Concentration PCA per weight based protocol for pain control. Patient used a total of 16.8 mg with 28 demands and 24 deliveries. He states that pain intensity is 4/10. Patient was given Dilaudid 4 mg 30 minutes prior to discharge. He maintains that he can function at home on current medication regimen. Patient is alert, oriented and ambulatory.   -Mr. Betsill was advised of follow-up appointment on tomorrow. -Patient alert, oriented, and ambulatory     BP 129/88 mmHg  Pulse 84  Temp(Src) 98.5 F (36.9 C) (Oral)  Resp 18  Ht 6' (1.829 m)  Wt 165 lb (74.844 kg)  BMI 22.37 kg/m2  SpO2 92%  PF   General Appearance:    Alert, cooperative, no distress, appears stated age  Head:    Normocephalic, without obvious abnormality, atraumatic  Eyes:    PERRL, conjunctiva/corneas clear, EOM's intact, fundi    benign, both eyes , icterus      Back:     Symmetric, no curvature, ROM normal, no CVA tenderness  Lungs:     Clear to auscultation bilaterally, respirations unlabored  Chest wall:    No tenderness or deformity  Heart:    Regular rate and rhythm, S1 and S2 normal, no murmur, rub   or gallop  Abdomen:     Soft, non-tender, bowel sounds active all four quadrants,    no masses, no organomegaly  Extremities:   Extremities normal, atraumatic, no cyanosis or edema  Pulses:   2+ and symmetric all extremities  Skin:   Skin color, texture, turgor normal, no rashes or lesions  Lymph nodes:   Cervical, supraclavicular, and axillary nodes normal  Neurologic:   CNII-XII intact. Normal strength, sensation and reflexes      throughout    Disposition at Discharge: 01-Home or Self Care  Discharge Orders:   Condition at Discharge:   Stable  Time spent on Discharge:  20  minutes.  Signed: Hollis,Lachina M 11/05/2014, 2:51 PM

## 2014-11-06 ENCOUNTER — Encounter: Payer: Self-pay | Admitting: Family Medicine

## 2014-11-06 ENCOUNTER — Ambulatory Visit (INDEPENDENT_AMBULATORY_CARE_PROVIDER_SITE_OTHER): Payer: Medicare Other | Admitting: Family Medicine

## 2014-11-06 VITALS — BP 118/78 | HR 85 | Temp 98.6°F | Resp 16 | Ht 72.0 in | Wt 166.0 lb

## 2014-11-06 DIAGNOSIS — E559 Vitamin D deficiency, unspecified: Secondary | ICD-10-CM | POA: Diagnosis not present

## 2014-11-06 DIAGNOSIS — I272 Other secondary pulmonary hypertension: Secondary | ICD-10-CM

## 2014-11-06 DIAGNOSIS — I10 Essential (primary) hypertension: Secondary | ICD-10-CM

## 2014-11-06 DIAGNOSIS — D571 Sickle-cell disease without crisis: Secondary | ICD-10-CM | POA: Diagnosis not present

## 2014-11-06 DIAGNOSIS — I482 Chronic atrial fibrillation, unspecified: Secondary | ICD-10-CM

## 2014-11-06 DIAGNOSIS — I4891 Unspecified atrial fibrillation: Secondary | ICD-10-CM | POA: Diagnosis not present

## 2014-11-06 DIAGNOSIS — G894 Chronic pain syndrome: Secondary | ICD-10-CM

## 2014-11-06 DIAGNOSIS — R9431 Abnormal electrocardiogram [ECG] [EKG]: Secondary | ICD-10-CM

## 2014-11-06 LAB — POCT URINALYSIS DIP (DEVICE)
Bilirubin Urine: NEGATIVE
Glucose, UA: NEGATIVE mg/dL
Ketones, ur: NEGATIVE mg/dL
LEUKOCYTES UA: NEGATIVE
Nitrite: NEGATIVE
PROTEIN: NEGATIVE mg/dL
SPECIFIC GRAVITY, URINE: 1.02 (ref 1.005–1.030)
UROBILINOGEN UA: 1 mg/dL (ref 0.0–1.0)
pH: 7 (ref 5.0–8.0)

## 2014-11-06 MED ORDER — HYDROXYUREA 500 MG PO CAPS: 500.0000 mg | ORAL_CAPSULE | Freq: Every day | ORAL | Status: DC

## 2014-11-06 MED ORDER — HYDROMORPHONE HCL 4 MG PO TABS: 4.0000 mg | ORAL_TABLET | ORAL | Status: DC | PRN

## 2014-11-06 NOTE — Progress Notes (Signed)
Subjective:    Patient ID: [name], male    DOB: January 19, 1981, 34 y.o.   MRN: [id number]  HPI  [name], a 34 year old male with a history of sickle cell anemia, HbSS presents for a follow-up of sickle cell anemia and medication management. [name] reports that he has 3/10 pain primarily to lower extremities. He was seen in the day infusion center for increased pain on 11/05/2014 and discharged home. Patient has been having increased pain over the past several weeks. [name] hydroxyurea was discontinued during a previous hospital stay. Patient is currently not followed by hematologist. Patient was discharged on 3 liters of oxygen following previous hospital discharge. He reports that he did not continue oxygen use at home.  [name] denies headache, shortness of breath, chest pains, nausea, vomiting, and diarrhea.  Immunization History  Administered Date(s) Administered  . Influenza Split 01/15/2012  . Influenza,inj,Quad PF,36+ Mos 11/14/2012, 10/28/2013, 10/27/2014  . Pneumococcal Conjugate-13 07/07/2014  . Pneumococcal Polysaccharide-23 01/15/2012  . Tdap 11/21/2012   Past Medical History  Diagnosis Date  . Sickle cell anemia (HCC)   . Blood transfusion   . Acute embolism and thrombosis of right internal jugular vein ([place])   . Hypokalemia   . Mood disorder ([place])   . History of pulmonary embolus (PE)   . Avascular necrosis ([place])   . Leukocytosis     Chronic  . Thrombocytosis (HCC)     Chronic  . Hypertension   . History of Clostridium difficile infection   . Uses marijuana   . Chronic anticoagulation   . Functional asplenia   . Former smoker   . Second hand tobacco smoke exposure   . Alcohol consumption of one to four drinks per day   . Noncompliance with medication regimen   . Sickle-cell crisis with associated acute chest syndrome ([place]) 05/13/2013  . Acute chest syndrome ([place]) 06/18/2013  . Demand ischemia ([place]) 01/02/2014   Social History    Social History  . Marital Status: Single    Spouse Name: N/A  . Number of Children: 0  . Years of Education: 13   Occupational History  . 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 Smoking status: Former Smoker -- 13 years    Quit date: 07/08/2010  . Smokeless tobacco: Never Used  . Alcohol Use: No  . Drug Use: 2.00 per week    Special: Marijuana  . Sexual Activity:    Partners: Female    Museum/gallery curator: None     Comment: month ago   Other Topics Concern  . Not on file   Social History Narrative   Lives in an apartment.  Single.  Lives alone but has a girlfriend that helps care for him.  Does not use any assist devices.        [name]:  [phone] Mom, emergency contact  No Known Allergies Review of Systems  Constitutional: Negative for fever, fatigue and unexpected weight change.  HENT: Negative.   Eyes: Negative.   Respiratory: Negative.   Gastrointestinal: Negative.  Negative for nausea and diarrhea.  Endocrine: Negative.  Negative for polydipsia, polyphagia and polyuria.  Genitourinary: Negative.   Musculoskeletal: Positive for myalgias (lower extremities).  Skin: Negative.   Allergic/Immunologic: Negative.   Neurological: Negative.  Negative for dizziness.  Hematological: Negative.   Psychiatric/Behavioral: Negative.       Objective:  Physical Exam  Constitutional: He is oriented to person, place, and time.  HENT:  Head: Normocephalic and atraumatic.  Right Ear: External ear normal.  Left Ear: External ear normal.  Mouth/Throat: Oropharynx is clear and moist.  Eyes: Conjunctivae and EOM are normal. Pupils are equal, round, and reactive to light.  Neck: Normal range of motion. Neck supple.  Cardiovascular: Intact distal pulses and normal pulses.  An irregularly irregular rhythm present.  Pulmonary/Chest: Effort normal and breath sounds normal.  Abdominal: Soft. Bowel sounds are  normal.  Musculoskeletal: Normal range of motion.  Neurological: He is alert and oriented to person, place, and time. He has normal reflexes.  Skin: Skin is warm and dry.  Psychiatric: He has a normal mood and affect. His behavior is normal. Judgment and thought content normal.      BP 118/78 mmHg  Pulse 85  Temp(Src) 98.6 F (37 C) (Oral)  Resp 16  Ht 6' (1.829 m)  Wt 166 lb (75.297 kg)  BMI 22.51 kg/m2  SpO2 95% Assessment & Plan:  1. Hb-SS disease with crisis Patient was previously on 1500 mg of hydrea, unclear on why medication was discontinued. Reviewed most recent CBC w/ differential. ANC >2, platelet count > 80K, and hemoglobin is >6. Will re-start hydroxyurea at 500 mg daily. Reviewed recent CBC w/differential and reticulocyte results in 4 weeks, if stable will increase hydrea. Patient has an appointment with hematologist on tomorrow. He states that he will have to re-schedule due to job constraints.  . We discussed the need for good hydration, monitoring of hydration status, avoidance of heat, cold, stress, and infection triggers. We discussed the risks and benefits of Hydrea, including bone marrow suppression, the possibility of GI upset, skin ulcers, hair thinning, and teratogenicity. The patient was reminded of the need to seek medical attention of any symptoms of bleeding, anemia, or infection. Continue folic acid 1 mg daily to prevent aplastic bone marrow crises.   -No proteinuria present    Pulmonary evaluation - It has been recommended that oxygen saturation remains greater than 93% due to pulmonary hypertension. Patient was discharged with 3 liters of oxygen during previous hospital visit. He states that he will not wear oxygen. Patient did not continue home oxygen therapy. Patient denies severe recurrent wheezes, shortness of breath with exercise, or persistent cough. If these symptoms develop, pulmonary function tests with spirometry will be ordered, and if abnormal, plan on  referral to Pulmonology for further evaluation.  Cardiac - Abnormal EKG and echocardiogram. Patient has a history of pulmonary hypertension. He has been referred to cardiology on several visits.  Will send a referral to [name], cardiologist   Eye - High risk of proliferative retinopathy. Annual eye exam with retinal exam recommended to patient. Sent opthalmology several months ago, patient did not show for scheduled appointment.    Immunization status - Immunizations are up to date.   Acute and chronic painful episodes - We agreed on continuing MS contin 30 mg every 12 hours.  Will also continue Dilaudid 4 mg every 4 hours as needed for moderate to severe pain #90. Patient is currently at 156 milli equivalents of morphine per day, will discuss weaning opiate medications at follow up in 1 month..  We discussed that pt is to receive her Schedule II prescriptions only from [place]. Pt is also aware that the prescription history is available to [place] online through the [facility] CSRS. Controlled substance agreement signed on 07/08/2014. We reminded [name]  that all  patients receiving Schedule II narcotics must be seen for follow within one month of prescription being requested. We reviewed the terms of our pain agreement, including the need to keep medicines in a safe locked location away from children or pets, and the need to report excess sedation or constipation, measures to avoid constipation, and policies related to early refills and stolen prescriptions. According to the [place] [facility] program, we have reviewed details related to analgesia, adverse effects, aberrant behaviors. Reviewed [place] Substance Reporting system prior to reorder, no inconsistencies noted.    Iron overload from chronic transfusion.  Patient currently does not require frequent transfusions. Will check ferritin levels.   - Prescription Monitoring Profile (17)-[facility] - POCT urinalysis dipstick - HYDROmorphone (DILAUDID) 4  MG tablet; Take 1 tablet (4 mg total) by mouth every 4 (four) hours as needed for severe pain.  Dispense: 90 tablet; Refill: 0 - hydroxyurea (HYDREA) 500 MG capsule; Take 1 capsule (500 mg total) by mouth daily. May take with food to minimize GI side effects.  Dispense: 30 capsule; Refill: 0  2. Vitamin D deficiency Will check vitamin D level. [name] is not taking vitamin D consistently  3. Chronic pain syndrome - Prescription Monitoring Profile (17)-[facility]  4. Chronic atrial fibrillation [facility]) Patient is on lovenox therapy, he is to continue as prescribed - Prescription Monitoring Profile (17)-[facility] - EKG 12-Lead - Ambulatory referral to Cardiology  5. EKG, abnormal Patient continues to have an abnormal EKG. Reviewed previous echocardiogram performed in March, 2016. Patient has previously been referred to cardiology and has missed several appointments. Will re-send referral to cardiology.  [name] and I discussed the importance following up with cardiology. His last referral was sent on 01/16/2014  6. Essential hypertension Blood pressure is at goal on current medication regimen. No proteinuria present.   7. Pulmonary hypertension:  Will send referral to cardiology for further evaluation  RTC: 1 month The patient was given clear instructions to go to ER or return to medical center if symptoms do not improve, worsen or new problems develop. The patient verbalized understanding. Will notify patient with laboratory results. Dorena Dew, FNP

## 2014-11-06 NOTE — Patient Instructions (Signed)
Sickle Cell Anemia, Adult Sickle cell anemia is a condition in which red blood cells have an abnormal "sickle" shape. This abnormal shape shortens the cells' life span, which results in a lower than normal concentration of red blood cells in the blood. The sickle shape also causes the cells to clump together and block free blood flow through the blood vessels. As a result, the tissues and organs of the body do not receive enough oxygen. Sickle cell anemia causes organ damage and pain and increases the risk of infection. CAUSES  Sickle cell anemia is a genetic disorder. Those who receive two copies of the gene have the condition, and those who receive one copy have the trait. RISK FACTORS The sickle cell gene is most common in people whose families originated in [place]. Other areas of the globe where sickle cell trait occurs include the Mediterranean, [place] and [place], the [place], and the [place].  SIGNS AND SYMPTOMS  Pain, especially in the extremities, back, chest, or abdomen (common). The pain may start suddenly or may develop following an illness, especially if there is dehydration. Pain can also occur due to overexertion or exposure to extreme temperature changes.  Frequent severe bacterial infections, especially certain types of pneumonia and meningitis.  Pain and swelling in the hands and feet.  Decreased activity.   Loss of appetite.   Change in behavior.  Headaches.  Seizures.  Shortness of breath or difficulty breathing.  Vision changes.  Skin ulcers. Those with the trait may not have symptoms or they may have mild symptoms.  DIAGNOSIS  Sickle cell anemia is diagnosed with blood tests that demonstrate the genetic trait. It is often diagnosed during the newborn period, due to mandatory testing nationwide. A variety of blood tests, X-rays, CT scans, MRI scans, ultrasounds, and lung function tests may also be done to monitor the condition. TREATMENT  Sickle  cell anemia may be treated with:  Medicines. You may be given pain medicines, antibiotic medicines (to treat and prevent infections) or medicines to increase the production of certain types of hemoglobin.  Fluids.  Oxygen.  Blood transfusions. HOME CARE INSTRUCTIONS   Drink enough fluid to keep your urine clear or pale yellow. Increase your fluid intake in hot weather and during exercise.  Do not smoke. Smoking lowers oxygen levels in the blood.   Only take over-the-counter or prescription medicines for pain, fever, or discomfort as directed by your health care provider.  Take antibiotics as directed by your health care provider. Make sure you finish them it even if you start to feel better.   Take supplements as directed by your health care provider.   Consider wearing a medical alert bracelet. This tells anyone caring for you in an emergency of your condition.   When traveling, keep your medical information, health care provider's names, and the medicines you take with you at all times.   If you develop a fever, do not take medicines to reduce the fever right away. This could cover up a problem that is developing. Notify your health care provider.  Keep all follow-up appointments with your health care provider. Sickle cell anemia requires regular medical care. SEEK MEDICAL CARE IF: You have a fever. SEEK IMMEDIATE MEDICAL CARE IF:   You feel dizzy or faint.   You have new abdominal pain, especially on the left side near the stomach area.   You develop a persistent, often uncomfortable and painful penile erection (priapism). If this is not treated immediately it   will lead to impotence.   You have numbness your arms or legs or you have a hard time moving them.   You have a hard time with speech.   You have a fever or persistent symptoms for more than 2-3 days.   You have a fever and your symptoms suddenly get worse.   You have signs or symptoms of infection.  These include:   Chills.   Abnormal tiredness (lethargy).   Irritability.   Poor eating.   Vomiting.   You develop pain that is not helped with medicine.   You develop shortness of breath.  You have pain in your chest.   You are coughing up pus-like or bloody sputum.   You develop a stiff neck.  Your feet or hands swell or have pain.  Your abdomen appears bloated.  You develop joint pain. MAKE SURE YOU:  Understand these instructions.   This information is not intended to replace advice given to you by your health care provider. Make sure you discuss any questions you have with your health care provider.   Document Released: 04/27/2005 Document Revised: 02/07/2014 Document Reviewed: 08/29/2012 Elsevier Interactive Patient Education 2016 [employer]  

## 2014-11-07 ENCOUNTER — Other Ambulatory Visit: Payer: Self-pay

## 2014-11-07 ENCOUNTER — Ambulatory Visit: Payer: Self-pay | Admitting: Hematology

## 2014-11-07 IMAGING — CR DG HIP (WITH OR WITHOUT PELVIS) 2-3V*L*
3 series · 3 of 3 positions shown · non-contrast
Comparison: None.

CLINICAL DATA: Left hip pain, sickle cell disease

LEFT HIP - COMPLETE 2+ VIEW

[t pelvis a.p.]
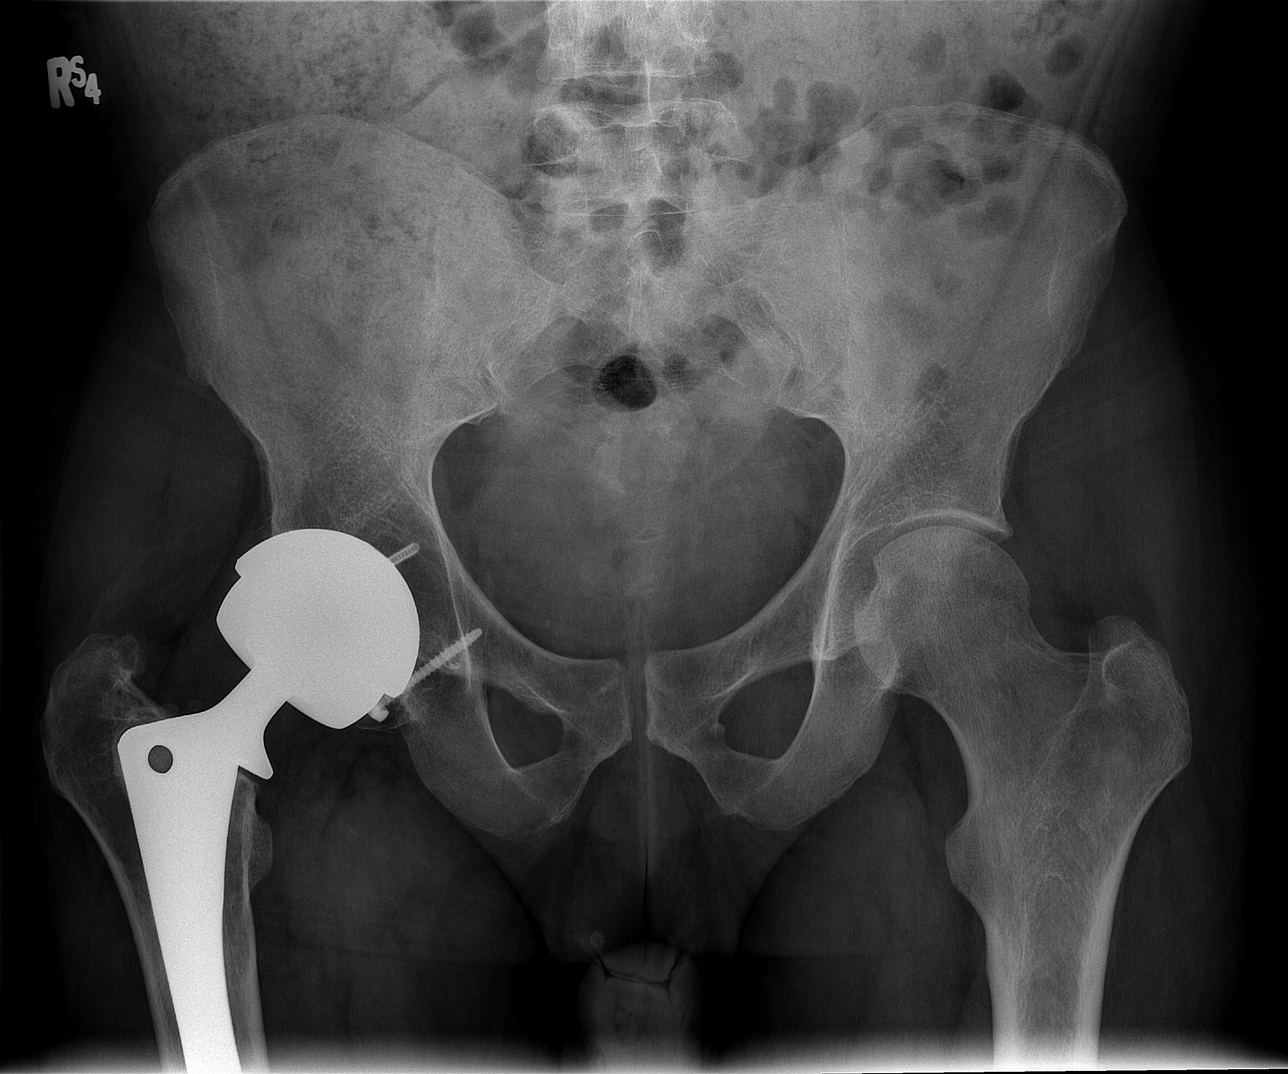

[t hip ap left]
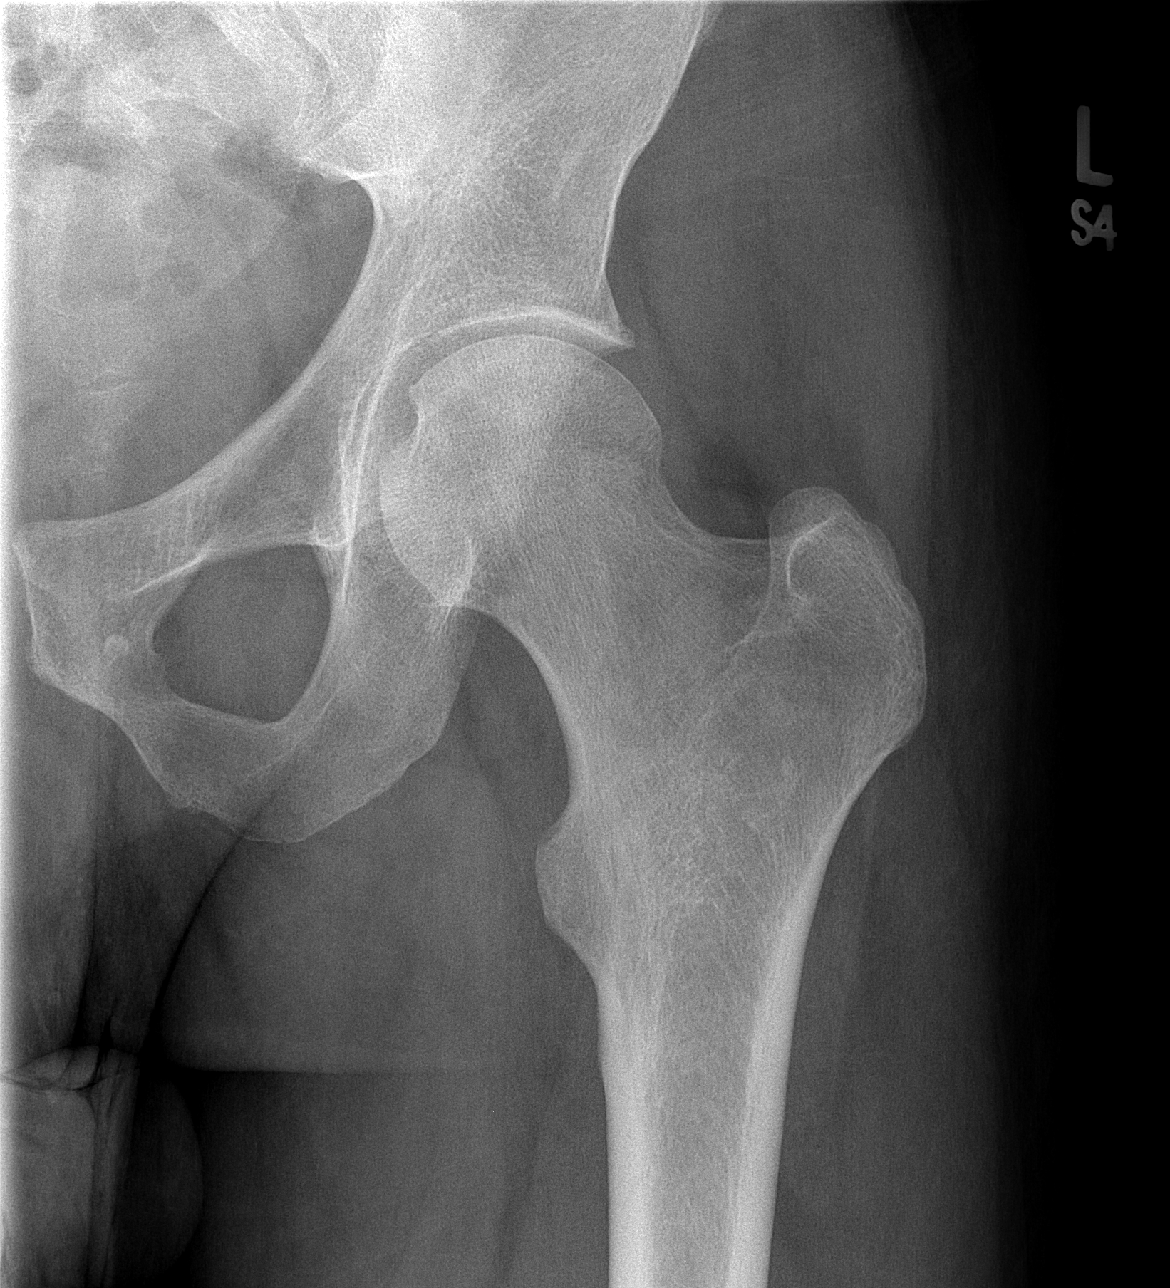

[t hip frog leg left]
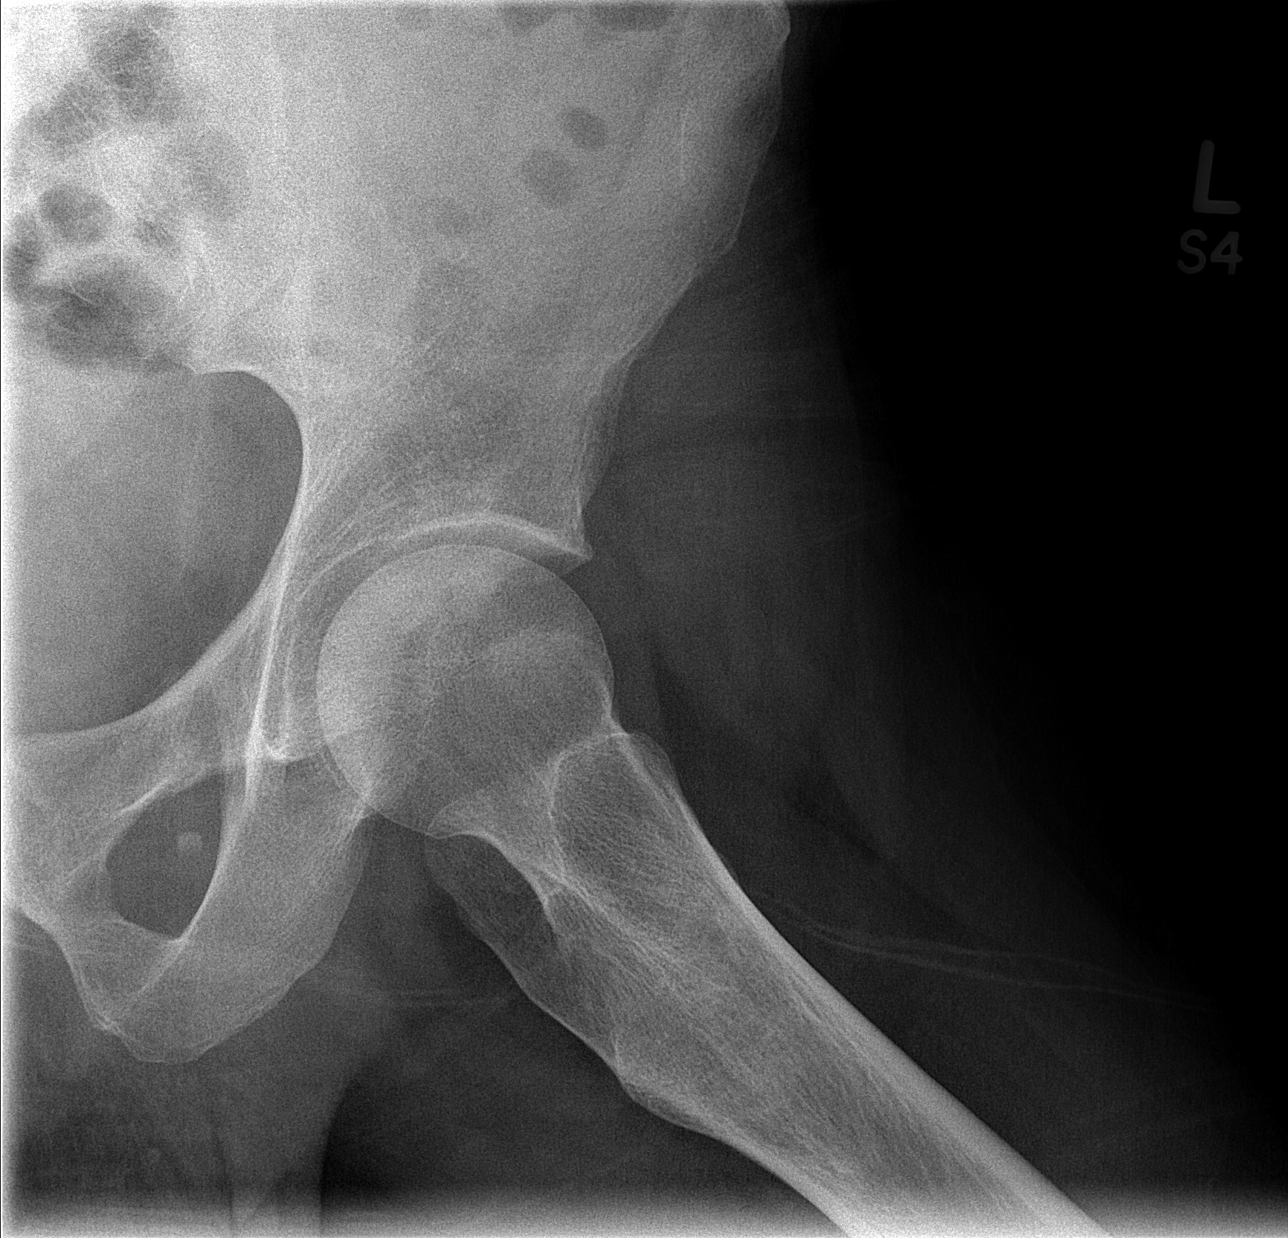

[3 of 3 positions shown; findings below may reference images not displayed]

FINDINGS: A view of the pelvis and two views of the left hip show
no acute abnormality.  The left hip joint space is well preserved.
The rami are intact.  A right total hip replacement is noted.
IMPRESSION: Negative left hip.  Right total hip replacement.

## 2014-11-10 LAB — OPIATES/OPIOIDS (LC/MS-MS)
Codeine Urine: NEGATIVE ng/mL (ref <50)
Hydrocodone: NEGATIVE ng/mL (ref <50)
Hydromorphone: 530 ng/mL — AB (ref <50)
MORPHINE: 52219 ng/mL — AB (ref <50)
NORHYDROCODONE, UR: NEGATIVE ng/mL (ref <50)
Noroxycodone, Ur: NEGATIVE ng/mL (ref <50)
OXYCODONE, UR: NEGATIVE ng/mL (ref <50)
OXYMORPHONE, URINE: NEGATIVE ng/mL (ref <50)

## 2014-11-10 LAB — ZOLPIDEM (LC/MS-MS), URINE
Zolpidem (GC/LC/MS), Ur confirm: NEGATIVE ng/mL (ref <5)
Zolpidem metabolite (GC/LC/MS) Ur, confirm: 1600 ng/mL — AB (ref <5)

## 2014-11-10 LAB — CANNABANOIDS (GC/LC/MS), URINE: THC-COOH (GC/LC/MS), ur confirm: 679 ng/mL — AB (ref <5)

## 2014-11-11 LAB — PRESCRIPTION MONITORING PROFILE (SOLSTAS)
AMPHETAMINE/METH: NEGATIVE ng/mL
BARBITURATE SCREEN, URINE: NEGATIVE ng/mL
BENZODIAZEPINE SCREEN, URINE: NEGATIVE ng/mL
Buprenorphine, Urine: NEGATIVE ng/mL
Carisoprodol, Urine: NEGATIVE ng/mL
Cocaine Metabolites: NEGATIVE ng/mL
Creatinine, Urine: 65.46 mg/dL (ref >20.0)
Fentanyl, Ur: NEGATIVE ng/mL
MDMA URINE: NEGATIVE ng/mL
METHADONE SCREEN, URINE: NEGATIVE ng/mL
Meperidine, Ur: NEGATIVE ng/mL
NITRITES URINE, INITIAL: NEGATIVE ug/mL
OXYCODONE SCRN UR: NEGATIVE ng/mL
PROPOXYPHENE: NEGATIVE ng/mL
TAPENTADOLUR: NEGATIVE ng/mL
Tramadol Scrn, Ur: NEGATIVE ng/mL
pH, Initial: 7.4 pH (ref 4.5–8.9)

## 2014-11-13 ENCOUNTER — Emergency Department (HOSPITAL_COMMUNITY)
Admission: EM | Admit: 2014-11-13 | Discharge: 2014-11-13 | Disposition: A | Discharge status: Home / Self Care | Payer: Medicare Other | Attending: Emergency Medicine | Admitting: Emergency Medicine

## 2014-11-13 ENCOUNTER — Emergency Department (HOSPITAL_COMMUNITY): Payer: Medicare Other

## 2014-11-13 ENCOUNTER — Encounter (HOSPITAL_COMMUNITY): Payer: Self-pay | Admitting: Emergency Medicine

## 2014-11-13 DIAGNOSIS — Z8739 Personal history of other diseases of the musculoskeletal system and connective tissue: Secondary | ICD-10-CM | POA: Insufficient documentation

## 2014-11-13 DIAGNOSIS — E876 Hypokalemia: Secondary | ICD-10-CM | POA: Insufficient documentation

## 2014-11-13 DIAGNOSIS — D5700 Hb-SS disease with crisis, unspecified: Secondary | ICD-10-CM | POA: Diagnosis present

## 2014-11-13 DIAGNOSIS — Z8619 Personal history of other infectious and parasitic diseases: Secondary | ICD-10-CM | POA: Diagnosis not present

## 2014-11-13 DIAGNOSIS — Z87891 Personal history of nicotine dependence: Secondary | ICD-10-CM | POA: Insufficient documentation

## 2014-11-13 DIAGNOSIS — Z7982 Long term (current) use of aspirin: Secondary | ICD-10-CM | POA: Insufficient documentation

## 2014-11-13 DIAGNOSIS — Z7901 Long term (current) use of anticoagulants: Secondary | ICD-10-CM | POA: Insufficient documentation

## 2014-11-13 DIAGNOSIS — R079 Chest pain, unspecified: Secondary | ICD-10-CM | POA: Diagnosis not present

## 2014-11-13 DIAGNOSIS — R0789 Other chest pain: Secondary | ICD-10-CM | POA: Diagnosis not present

## 2014-11-13 DIAGNOSIS — I10 Essential (primary) hypertension: Secondary | ICD-10-CM | POA: Insufficient documentation

## 2014-11-13 DIAGNOSIS — I2699 Other pulmonary embolism without acute cor pulmonale: Secondary | ICD-10-CM | POA: Insufficient documentation

## 2014-11-13 DIAGNOSIS — Z79899 Other long term (current) drug therapy: Secondary | ICD-10-CM | POA: Insufficient documentation

## 2014-11-13 DIAGNOSIS — Q8901 Asplenia (congenital): Secondary | ICD-10-CM | POA: Insufficient documentation

## 2014-11-13 LAB — RETICULOCYTES
RBC.: 2.32 MIL/uL — ABNORMAL LOW (ref 4.22–5.81)
Retic Count, Absolute: 419.9 10*3/uL — ABNORMAL HIGH (ref 19.0–186.0)
Retic Ct Pct: 18.1 % — ABNORMAL HIGH (ref 0.4–3.1)

## 2014-11-13 LAB — CBC WITH DIFFERENTIAL/PLATELET
Basophils Absolute: 0.1 10*3/uL (ref 0.0–0.1)
Basophils Relative: 0 %
Eosinophils Absolute: 0.4 10*3/uL (ref 0.0–0.7)
Eosinophils Relative: 3 %
HEMATOCRIT: 21.4 % — AB (ref 39.0–52.0)
Hemoglobin: 7.5 g/dL — ABNORMAL LOW (ref 13.0–17.0)
LYMPHS PCT: 9 %
Lymphs Abs: 1.5 10*3/uL (ref 0.7–4.0)
MCH: 32.3 pg (ref 26.0–34.0)
MCHC: 35 g/dL (ref 30.0–36.0)
MCV: 92.2 fL (ref 78.0–100.0)
MONO ABS: 1.7 10*3/uL — AB (ref 0.1–1.0)
MONOS PCT: 10 %
NEUTROS ABS: 12.9 10*3/uL — AB (ref 1.7–7.7)
Neutrophils Relative %: 78 %
Platelets: 482 10*3/uL — ABNORMAL HIGH (ref 150–400)
RBC: 2.32 MIL/uL — ABNORMAL LOW (ref 4.22–5.81)
RDW: 20 % — AB (ref 11.5–15.5)
WBC: 16.6 10*3/uL — ABNORMAL HIGH (ref 4.0–10.5)

## 2014-11-13 LAB — COMPREHENSIVE METABOLIC PANEL
ALT: 36 U/L (ref 17–63)
AST: 48 U/L — AB (ref 15–41)
Albumin: 4.1 g/dL (ref 3.5–5.0)
Alkaline Phosphatase: 90 U/L (ref 38–126)
Anion gap: 4 — ABNORMAL LOW (ref 5–15)
BILIRUBIN TOTAL: 5.4 mg/dL — AB (ref 0.3–1.2)
BUN: 12 mg/dL (ref 6–20)
CO2: 24 mmol/L (ref 22–32)
CREATININE: 0.55 mg/dL — AB (ref 0.61–1.24)
Calcium: 8.9 mg/dL (ref 8.9–10.3)
Chloride: 107 mmol/L (ref 101–111)
GFR calc Af Amer: >60 mL/min (ref >60)
Glucose, Bld: 110 mg/dL — ABNORMAL HIGH (ref 65–99)
Potassium: 3.9 mmol/L (ref 3.5–5.1)
Sodium: 135 mmol/L (ref 135–145)
TOTAL PROTEIN: 8 g/dL (ref 6.5–8.1)

## 2014-11-13 LAB — TROPONIN I: TROPONIN I: 0.14 ng/mL — AB (ref <0.031)

## 2014-11-13 MED ORDER — HYDROMORPHONE HCL 2 MG/ML IJ SOLN
2.0000 mg | Freq: Once | INTRAMUSCULAR | Status: AC
  Administered 2014-11-13: 2 mg via INTRAVENOUS
  Filled 2014-11-13: qty 1

## 2014-11-13 MED ORDER — SODIUM CHLORIDE 0.45 % IV SOLN
INTRAVENOUS | Status: DC
Start: 2014-11-13 — End: 2014-11-13
  Administered 2014-11-13: 09:00:00 via INTRAVENOUS

## 2014-11-13 MED ORDER — HEPARIN SOD (PORK) LOCK FLUSH 100 UNIT/ML IV SOLN
500.0000 [IU] | Freq: Once | INTRAVENOUS | Status: AC
  Administered 2014-11-13: 500 [IU]
  Filled 2014-11-13: qty 5

## 2014-11-13 MED ORDER — DIPHENHYDRAMINE HCL 25 MG PO CAPS
25.0000 mg | ORAL_CAPSULE | ORAL | Status: DC | PRN
  Administered 2014-11-13: 25 mg via ORAL
  Filled 2014-11-13: qty 1

## 2014-11-13 MED ORDER — HYDROMORPHONE HCL 2 MG/ML IJ SOLN
2.8000 mg | Freq: Once | INTRAMUSCULAR | Status: AC
  Administered 2014-11-13: 2.8 mg via INTRAVENOUS
  Filled 2014-11-13: qty 2

## 2014-11-13 MED ORDER — HYDROMORPHONE HCL 2 MG/ML IJ SOLN: 0.0250 mg/kg | INTRAMUSCULAR | Status: DC

## 2014-11-13 MED ORDER — KETOROLAC TROMETHAMINE 30 MG/ML IJ SOLN
30.0000 mg | INTRAMUSCULAR | Status: AC
  Administered 2014-11-13: 30 mg via INTRAVENOUS
  Filled 2014-11-13: qty 1

## 2014-11-13 MED ORDER — ONDANSETRON HCL 4 MG/2ML IJ SOLN
4.0000 mg | INTRAMUSCULAR | Status: DC | PRN
  Administered 2014-11-13: 4 mg via INTRAVENOUS
  Filled 2014-11-13: qty 2

## 2014-11-13 MED ORDER — HYDROMORPHONE HCL 2 MG/ML IJ SOLN
0.0250 mg/kg | INTRAMUSCULAR | Status: AC
  Administered 2014-11-13: 1.9 mg via INTRAVENOUS
  Filled 2014-11-13: qty 1

## 2014-11-13 MED ORDER — IOHEXOL 350 MG/ML SOLN
100.0000 mL | Freq: Once | INTRAVENOUS | Status: AC | PRN
  Administered 2014-11-13: 100 mL via INTRAVENOUS

## 2014-11-13 MED ORDER — HYDROMORPHONE HCL 2 MG/ML IJ SOLN
2.4000 mg | Freq: Once | INTRAMUSCULAR | Status: AC
  Administered 2014-11-13: 2.4 mg via INTRAVENOUS
  Filled 2014-11-13: qty 2

## 2014-11-13 NOTE — ED Notes (Signed)
Per pt, states B/L leg pain that started yesterday

## 2014-11-13 NOTE — ED Notes (Signed)
Pt requesting port access for blood draw 

## 2014-11-13 NOTE — ED Provider Notes (Signed)
CSN: [id number]     Arrival date & time 11/13/14  2919 History   First MD Initiated Contact with Patient 11/13/14 [phone]     Chief Complaint  Patient presents with  . Sickle Cell Pain Crisis     (Consider location/radiation/quality/duration/timing/severity/associated sxs/prior Treatment) HPI Comments: Patient presents to the emergency department for evaluation of sickle cell crisis. Patient reports symptoms began last night. His home meds have not relieved his pain. Patient is experiencing pain and bilateral flank and rib area as well as both legs. This is typical of his sickle cell crisis. He has not experiencing any anterior chest pain. There is no shortness of breath or cough. No fever. Pain is constant and severe.  Patient is a 34 y.o. male presenting with sickle cell pain.  Sickle Cell Pain Crisis Associated symptoms: chest pain   Associated symptoms: no cough and no shortness of breath     Past Medical History  Diagnosis Date  . Sickle cell anemia (HCC)   . Blood transfusion   . Acute embolism and thrombosis of right internal jugular vein ([place])   . Hypokalemia   . Mood disorder ([place])   . History of pulmonary embolus (PE)   . Avascular necrosis ([place])   . Leukocytosis     Chronic  . Thrombocytosis (HCC)     Chronic  . Hypertension   . History of Clostridium difficile infection   . Uses marijuana   . Chronic anticoagulation   . Functional asplenia   . Former smoker   . Second hand tobacco smoke exposure   . Alcohol consumption of one to four drinks per day   . Noncompliance with medication regimen   . Sickle-cell crisis with associated acute chest syndrome ([place]) 05/13/2013  . Acute chest syndrome ([place]) 06/18/2013  . Demand ischemia ([place]) 01/02/2014   Past Surgical History  Procedure Laterality Date  . Right hip replacement      08/2006  . Cholecystectomy      01/2008  . Porta cath placement    . Porta cath removal    . Umbilical hernia repair      01/2008  .  Excision of left periauricular cyst      10/2009  . Excision of right ear lobe cyst with primary closur      11/2007  . Portacath placement  01/05/2012    Procedure: INSERTION PORT-A-CATH;  Surgeon: Odis Hollingshead, MD;  Location: [facility];  Service: General;  Laterality: N/A;  ultrasound guiced port a cath insertion with fluoroscopy   Family History  Problem Relation Age of Onset  . Sickle cell trait Mother   . Depression Mother   . Diabetes Mother   . Sickle cell trait Father   . Sickle cell trait Brother    Social History  Substance Use Topics  . Smoking status: Former Smoker -- 13 years    Quit date: 07/08/2010  . Smokeless tobacco: Never Used  . Alcohol Use: No    Review of Systems  Respiratory: Negative for cough and shortness of breath.   Cardiovascular: Positive for chest pain.  Musculoskeletal: Positive for arthralgias.  All other systems reviewed and are negative.     Allergies  Review of patient's allergies indicates no known allergies.  Home Medications   Prior to Admission medications   Medication Sig Start Date End Date Taking? Authorizing Provider  aspirin 81 MG chewable tablet Chew 1 tablet (81 mg total) by mouth daily. 07/23/14  Yes [name],  MD  Cholecalciferol (VITAMIN D) 2000 UNITS tablet Take 1 tablet (2,000 Units total) by mouth daily. 02/13/14  Yes [name], MD  diltiazem (CARDIZEM CD) 120 MG 24 hr capsule Take 1 capsule (120 mg total) by mouth daily. 03/13/14  Yes Costin Karlyne Greenspan, MD  enoxaparin (LOVENOX) 150 MG/ML injection Inject 0.73 mLs (110 mg total) into the skin daily. 09/26/14  Yes Micheline Chapman, NP  folic acid (FOLVITE) 1 MG tablet Take 1 tablet (1 mg total) by mouth every morning. 10/08/13  Yes [name], MD  HYDROmorphone (DILAUDID) 4 MG tablet Take 1 tablet (4 mg total) by mouth every 4 (four) hours as needed for severe pain. 11/06/14  Yes Dorena Dew, FNP  hydroxyurea (HYDREA) 500 MG capsule Take 1  capsule (500 mg total) by mouth daily. May take with food to minimize GI side effects. 11/06/14  Yes Dorena Dew, FNP  lisinopril (PRINIVIL,ZESTRIL) 10 MG tablet Take 1 tablet (10 mg total) by mouth daily. 01/22/14  Yes [name], MD  metoprolol tartrate (LOPRESSOR) 25 MG tablet Take 1 tablet (25 mg total) by mouth daily. 08/22/13  Yes [name], MD  morphine (MS CONTIN) 30 MG 12 hr tablet Take 1 tablet (30 mg total) by mouth every 12 (twelve) hours. 10/22/14  Yes Dorena Dew, FNP  potassium chloride SA (K-DUR,KLOR-CON) 20 MEQ tablet Take 1 tablet (20 mEq total) by mouth every morning. 06/09/14  Yes [name], MD  zolpidem (AMBIEN) 10 MG tablet Take 1 tablet (10 mg total) by mouth at bedtime as needed for sleep. 10/23/14  Yes Olugbemiga E Doreene Burke, MD   BP 109/72 mmHg  Pulse 84  Temp(Src) 98.1 F (36.7 C) (Temporal)  Resp 16  Wt 161 lb 9.6 oz (73.3 kg)  SpO2 94% Physical Exam  Constitutional: He is oriented to person, place, and time. He appears well-developed and well-nourished. No distress.  HENT:  Head: Normocephalic and atraumatic.  Right Ear: Hearing normal.  Left Ear: Hearing normal.  Nose: Nose normal.  Mouth/Throat: Oropharynx is clear and moist and mucous membranes are normal.  Eyes: Conjunctivae and EOM are normal. Pupils are equal, round, and reactive to light.  Neck: Normal range of motion. Neck supple.  Cardiovascular: Regular rhythm, S1 normal and S2 normal.  Exam reveals no gallop and no friction rub.   No murmur heard. Pulmonary/Chest: Effort normal and breath sounds normal. No respiratory distress. He exhibits no tenderness.  Abdominal: Soft. Normal appearance and bowel sounds are normal. There is no hepatosplenomegaly. There is no tenderness. There is no rebound, no guarding, no tenderness at McBurney's point and negative Murphy's sign. No hernia.  Musculoskeletal: Normal range of motion.  Neurological: He is alert and oriented to person,  place, and time. He has normal strength. No cranial nerve deficit or sensory deficit. Coordination normal. GCS eye subscore is 4. GCS verbal subscore is 5. GCS motor subscore is 6.  Skin: Skin is warm, dry and intact. No rash noted. No cyanosis.  Psychiatric: He has a normal mood and affect. His speech is normal and behavior is normal. Thought content normal.  Nursing note and vitals reviewed.   ED Course  Procedures (including critical care time) Labs Review Labs Reviewed  CBC WITH DIFFERENTIAL/PLATELET - Abnormal; Notable for the following:    WBC 16.6 (*)    RBC 2.32 (*)    Hemoglobin 7.5 (*)    HCT 21.4 (*)    RDW 20.0 (*)  Platelets 482 (*)    Neutro Abs 12.9 (*)    Monocytes Absolute 1.7 (*)    All other components within normal limits  RETICULOCYTES - Abnormal; Notable for the following:    Retic Ct Pct 18.1 (*)    RBC. 2.32 (*)    Retic Count, Manual 419.9 (*)    All other components within normal limits  COMPREHENSIVE METABOLIC PANEL - Abnormal; Notable for the following:    Glucose, Bld 110 (*)    Creatinine, Ser 0.55 (*)    AST 48 (*)    Total Bilirubin 5.4 (*)    Anion gap 4 (*)    All other components within normal limits  TROPONIN I - Abnormal; Notable for the following:    Troponin I 0.14 (*)    All other components within normal limits    Imaging Review Dg Chest 2 View  11/13/2014  CLINICAL DATA:  Sickle cell pain crisis with left lateral chest wall pain EXAM: CHEST  2 VIEW COMPARISON:  10/26/2014 FINDINGS: Chronic cardiomegaly. Negative aortic and hilar contours. Stable left subclavian porta catheter placement, tip at the upper right atrium. Chronic interstitial coarsening consistent with scar. There is no edema, consolidation, effusion, or pneumothorax. No osseous findings to explain acute pain. Chronic humeral head osteonecrosis. Cholecystectomy. IMPRESSION: Stable lung scarring and cardiomegaly. No evidence of acute cardiopulmonary disease. Electronically  Signed   By: Monte Fantasia M.D.   On: 11/13/2014 08:15   Ct Angio Chest Pe W/cm &/or Wo Cm  11/13/2014  CLINICAL DATA:  Chest pain.  Sickle cell disease EXAM: CT ANGIOGRAPHY CHEST WITH CONTRAST TECHNIQUE: Multidetector CT imaging of the chest was performed using the standard protocol during bolus administration of intravenous contrast. Multiplanar CT image reconstructions and MIPs were obtained to evaluate the vascular anatomy. CONTRAST:  141mL OMNIPAQUE IOHEXOL 350 MG/ML SOLN COMPARISON:  Chest CT October 26, 2014 and November 13, 2014 chest radiograph FINDINGS: There is a small pulmonary embolus in the posterior segment right lower lobe pulmonary artery. No more central pulmonary emboli are identified. No evidence of right heart strain. There is no thoracic aortic aneurysm or dissection. The visualized great vessels appear normal. There is a small area of patchy infiltrate in the anterior segment of the left upper lobe, stable. There is patchy atelectatic change in both bases, stable. No new opacity is appreciable. Cardiomegaly is stable.  The pericardium is not thickened. Visualized thyroid appears normal. Anterior mediastinal adenopathy remains, stable. The largest individual lymph node in the anterior mediastinum measures 2.3 x 1.5 cm, unchanged. There is a single prominent lymph node in the right hilum measuring 1.6 x 1.2 cm. There is no new adenopathy. Central catheter tip is near the cavoatrial junction. In the visualized upper abdomen, there is diffuse calcification of the spleen consistent with a degree of autoinfarction, stable. 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 Bones overall are sclerotic consistent with the chronic anemia. There is a probable sebaceous cyst measuring 2.1 x 1.1 cm in the right anterior chest wall, stable. Review of the MIP images confirms the above findings. IMPRESSION: Small pulmonary embolus in a posterior segment  right lower lobe pulmonary artery branch, not present recently and presumed new. No other pulmonary emboli identified. Stable cardiomegaly and anterior mediastinal adenopathy. Single prominent lymph node in the right hilum. Stable areas of patchy lower lobe atelectasis and mild patchy infiltrate in the left upper lobe. No new parenchymal lung opacity. Stable bony changes  consistent with sickle cell anemia. Evidence of prior splenic autoinfarction. Critical Value/emergent results were called by telephone at the time of interpretation on 11/13/2014 at 12:32 pm to Dr. Joseph Berkshire , who verbally acknowledged these results. Electronically Signed   By: Lowella Grip III M.D.   On: 11/13/2014 12:34   I have personally reviewed and evaluated these images and lab results as part of my medical decision-making.   EKG Interpretation   Date/Time:  Thursday November 13 2014 08:00:19 EDT Ventricular Rate:  86 PR Interval:  193 QRS Duration: 118 QT Interval:  428 QTC Calculation: 512 R Axis:   -91 Text Interpretation:  Sinus rhythm Left atrial enlargement Nonspecific  IVCD with LAD Probable inferior infarct, age indeterminate Abnormal  lateral Q waves Baseline wander in lead(s) II aVR V4 No significant change  since Aug 2016 Confirmed by [facility]  MD, Jahmeek Shirk [phone]) on 11/13/2014  8:11:30 AM      MDM   Final diagnoses:  PE (pulmonary embolism)  Sickle cell crisis [facility])    Patient presents to the emergency department for evaluation of pain in both of his legs and anterior rib area. Patient reports that this is typical of his sickle cell crisis. It started yesterday, patient did not have relief of his home meds.  Patient is not experiencing difficulty breathing. He does, however, have a history of PE. Patient is on Lovenox, but does think that he has missed some doses. CT scan was therefore performed and he does have a new small PE and a posterior segmental region of the right. This is  new since CT scan 2 months ago.  Patient also has an elevated troponin. Reviewing records reveals that he was admitted 2 months ago with elevated troponins which were blamed on pulmonary hypertension and right heart strain. He does not have any change in his EKG.  Case was discussed with Dr. Zigmund Daniel. She did not feel the patient required admission at this time. Patient has a history of PEs and is not entirely anticoagulated because of missed doses. She does not feel he requires hospitalization for the PE, but rather start taking his Lovenox daily. Additionally, troponin is elevated because of his pulmonary hypertension, does not require hospitalization for this. She recommended discharge, follow-up with Dr. Doreene Burke tomorrow for repeat troponin. I have arranged for the patient to be seen in the office tomorrow. Patient was counseled that he should return immediately to the ER if he has worsening pain, chest pain or shortness of breath.    Orpah Greek, MD 11/13/14 1504

## 2014-11-13 NOTE — Progress Notes (Addendum)
His pt had last f/u visit with L Hollis, Sickle cell NP/PA on 11/06/14 and next 1 month f/u visit is 12/05/14 at 1030  Entered in his d/c instructions  Pt with 10 [facility] ED visits (1 in the last 7 days) and 25 admissions (last admission 10/26/14) in the last 6 months Pt has a medication contract with sickle cell clinic  Pt offered referral form for community care of [place] for sickle cell patients  Pt determined not to be a [place] TCC candidate per [facility] CM

## 2014-11-13 NOTE — Discharge Instructions (Signed)
Dr. Christa See office will call you today for a follow-up appointment for tomorrow. Return to ER for worsening symptoms, including chest pain or difficulty breathing  Sickle Cell Anemia, Adult Sickle cell anemia is a condition where your red blood cells are shaped like sickles. Red blood cells carry oxygen through the body. Sickle-shaped red blood cells do not live as long as normal red blood cells. They also clump together and block blood from flowing through the blood vessels. These things prevent the body from getting enough oxygen. Sickle cell anemia causes organ damage and pain. It also increases the risk of infection. HOME CARE  Drink enough fluid to keep your pee (urine) clear or pale yellow. Drink more in hot weather and during exercise.  Do not smoke. Smoking lowers oxygen levels in the blood.  Only take over-the-counter or prescription medicines as told by your doctor.  Take antibiotic medicines as told by your doctor. Make sure you finish them even if you start to feel better.  Take supplements as told by your doctor.  Consider wearing a medical alert bracelet. This tells anyone caring for you in an emergency of your condition.  When traveling, keep your medical information, doctors' names, and the medicines you take with you at all times.  If you have a fever, do not take fever medicines right away. This could cover up a problem. Tell your doctor.   Keep all follow-up visits with your doctor. Sickle cell anemia requires regular medical care. GET HELP IF: You have a fever. GET HELP RIGHT AWAY IF:  You feel dizzy or faint.  You have new belly (abdominal) pain, especially on the left side near the stomach area.  You have a lasting, often uncomfortable and painful erection of the penis (priapism). If it is not treated right away, you will become unable to have sex (impotence).  You have numbness in your arms or legs or you have a hard time moving them.  You have a hard time  talking.  You have a fever or lasting symptoms for more than 2-3 days.  You have a fever and your symptoms suddenly get worse.  You have signs or symptoms of infection. These include:  Chills.  Being more tired than normal (lethargy).  Irritability.  Poor eating.  Throwing up (vomiting).  You have pain that is not helped with medicine.  You have shortness of breath.  You have pain in your chest.  You are coughing up pus-like or bloody mucus.  You have a stiff neck.  Your feet or hands swell or have pain.  Your belly looks bloated.  Your joints hurt. MAKE SURE YOU:  Understand these instructions.  Will watch your condition.  Will get help right away if you are not doing well or get worse.   This information is not intended to replace advice given to you by your health care provider. Make sure you discuss any questions you have with your health care provider.   Document Released: 11/07/2012 Document Revised: 06/03/2014 Document Reviewed: 11/07/2012 Elsevier Interactive Patient Education 2016 [employer]  Pulmonary Embolism A pulmonary embolism (PE) is a sudden blockage or decrease of blood flow in one lung or both lungs. Most blockages come from a blood clot that travels from the legs or the pelvis to the lungs. PE is a dangerous and potentially life-threatening condition if it is not treated right away. CAUSES A pulmonary embolism occurs most commonly when a blood clot travels from one of your veins to  your lungs. Rarely, PE is caused by air, fat, amniotic fluid, or part of a tumor traveling through your veins to your lungs. RISK FACTORS A PE is more likely to develop in:  People who smoke.  People who areolder, especially over 26 years of age.  People who are overweight (obese).  People who sit or lie still for a long time, such as during long-distance travel (over 4 hours), bed rest, hospitalization, or during recovery from certain medical conditions  like a stroke.  People who do not engage in much physical activity (sedentary lifestyle).  People who have chronic breathing disorders.  People whohave a personal or family history of blood clots or blood clotting disease.  People whohave peripheral vascular disease (PVD), diabetes, or some types of cancer.  People who haveheart disease, especially if the person had a recent heart attack or has congestive heart failure.  People who have neurological diseases that affect the legs (leg paresis).  People who have had a traumatic injury, such as breaking a hip or leg.  People whohave recently had major or lengthy surgery, especially on the hip, knee, or abdomen.  People who have hada central line placed inside a large vein.  People who takemedicines that contain the hormone estrogen. These include birth control pills and hormone replacement therapy.  Pregnancy or during childbirth or the postpartum period. SIGNS AND SYMPTOMS  The symptoms of a PE usually start suddenly and include:  Shortness of breath while active or at rest.  Coughing or coughing up blood or blood-tinged mucus.  Chest pain that is often worse with deep breaths.  Rapid or irregular heartbeat.  Feeling light-headed or dizzy.  Fainting.  Feelinganxious.  Sweating. There may also be pain and swelling in a leg if that is where the blood clot started. These symptoms may represent a serious problem that is an emergency. Do not wait to see if the symptoms will go away. Get medical help right away. Call your local emergency services (911 in the [place] Do not drive yourself to the hospital. DIAGNOSIS Your health care provider will take a medical history and perform a physical exam. You may also have other tests, including:  Blood tests to assess the clotting properties of your blood, assess oxygen levels in your blood, and find blood clots.  Imaging tests, such as CT, ultrasound, MRI, X-ray, and other tests  to see if you have clots anywhere in your body.  An electrocardiogram (ECG) to look for heart strain from blood clots in the lungs. TREATMENT The main goals of PE treatment are:  To stop a blood clot from growing larger.  To stop new blood clots from forming. The type of treatment that you receive depends on many factors, such as the cause of your PE, your risk for bleeding or developing more clots, and other medical conditions that you have. Sometimes, a combination of treatments is necessary. This condition may be treated with:  Medicines, including newer oral blood thinners (anticoagulants), warfarin, low molecular weight heparins, thrombolytics, or heparins.  Wearing compression stockings or using different types of devices.  Surgery (rare) to remove the blood clot or to place a filter in your abdomen to stop the blood clot from traveling to your lungs. Treatments for a PE are often divided into immediate treatment, long-term treatment (up to 3 months after PE), and extended treatment (more than 3 months after PE). Your treatment may continue for several months. This is called maintenance therapy, and it is used  to prevent the forming of new blood clots. You can work with your health care provider to choose the treatment program that is best for you. What are anticoagulants? Anticoagulants are medicines that treat PEs. They can stop current blood clots from growing and stop new clots from forming. They cannot dissolve existing clots. Your body dissolves clots by itself over time. Anticoagulants are given by mouth, by injection, or through an IV tube. What are thrombolytics? Thrombolytics are clot-dissolving medicines that are used to dissolve a PE. They carry a high risk of bleeding, so they tend to be used only in severe cases or if you have very low blood pressure. HOME CARE INSTRUCTIONS If you are taking a newer oral anticoagulant:  Take the medicine every single day at the same  time each day.  Understand what foods and drugs interact with this medicine.  Understand that there are no regular blood tests required when using this medicine.  Understandthe side effects of this medicine, including excessive bruising or bleeding. Ask your health care provider or pharmacist about other possible side effects. If you are taking warfarin:  Understand how to take warfarin and know which foods can affect how warfarin works in Veterinary surgeon.  Understand that it is dangerous to taketoo much or too little warfarin. Too much warfarin increases the risk of bleeding. Too little warfarin continues to allow the risk for blood clots.  Follow your PT and INR blood testing schedule. The PT and INR results allow your health care provider to adjust your dose of warfarin. It is very important that you have your PT and INR tested as often as told by your health care provider.  Avoid major changes in your diet, or tell your health care provider before you change your diet. Arrange a visit with a registered dietitian to answer your questions. Many foods, especially foods that are high in vitamin K, can interfere with warfarin and affect the PT and INR results. Eat a consistent amount of foods that are high in vitamin K, such as:  Spinach, kale, broccoli, cabbage, collard greens, turnip greens, Brussels sprouts, peas, cauliflower, seaweed, and parsley.  Beef liver and pork liver.  Green tea.  Soybean oil.  Tell your health care provider about any and all medicines, vitamins, and supplements that you take, including aspirin and other over-the-counter anti-inflammatory medicines. Be especially cautious with aspirin and anti-inflammatory medicines. Do not take those before you ask your health care provider if it is safe to do so. This is important because many medicines can interfere with warfarin and affect the PT and INR results.  Do not start or stop taking any over-the-counter or prescription  medicine unless your health care provider or pharmacist tells you to do so. If you take warfarin, you will also need to do these things:  Hold pressure over cuts for longer than usual.  Tell your dentist and other health care providers that you are taking warfarin before you have any procedures in which bleeding may occur.  Avoid alcohol or drink very small amounts. Tell your health care provider if you change your alcohol intake.  Do not use tobacco products, including cigarettes, chewing tobacco, and e-cigarettes. If you need help quitting, ask your health care provider.  Avoid contact sports. General Instructions  Take over-the-counter and prescription medicines only as told by your health care provider. Anticoagulant medicines can have side effects, including easy bruising and difficulty stopping bleeding. If you are prescribed an anticoagulant, you will also need  to do these things:  Hold pressure over cuts for longer than usual.  Tell your dentist and other health care providers that you are taking anticoagulants before you have any procedures in which bleeding may occur.  Avoid contact sports.  Wear a medical alert bracelet or carry a medical alert card that says you have had a PE.  Ask your health care provider how soon you can go back to your normal activities. Stay active to prevent new blood clots from forming.  Make sure to exercise while traveling or when you have been sitting or standing for a long period of time. It is very important to exercise. Exercise your legs by walking or by tightening and relaxing your leg muscles often. Take frequent walks.  Wear compression stockings as told by your health care provider to help prevent more blood clots from forming.  Do not use tobacco products, including cigarettes, chewing tobacco, and e-cigarettes. If you need help quitting, ask your health care provider.  Keep all follow-up appointments with your health care provider. This  is important. PREVENTION Take these actions to decrease your risk of developing another PE:  Exercise regularly. For at least 30 minutes every day, engage in:  Activity that involves moving your arms and legs.  Activity that encourages good blood flow through your body by increasing your heart rate.  Exercise your arms and legs every hour during long-distance travel (over 4 hours). Drink plenty of water and avoid drinking alcohol while traveling.  Avoid sitting or lying in bed for long periods of time without moving your legs.  Maintain a weight that is appropriate for your height. Ask your health care provider what weight is healthy for you.  If you are a woman who is over 16 years of age, avoid unnecessary use of medicines that contain estrogen. These include birth control pills.  Do not smoke, especially if you take estrogen medicines. If you need help quitting, ask your health care provider.  If you are at very high risk for PE, wear compression stockings.  If you recently had a PE, have regularly scheduled ultrasound testing on your legs to check for new blood clots. If you are hospitalized, prevention measures may include:  Early walking after surgery, as soon as your health care provider says that it is safe.  Receiving anticoagulants to prevent blood clots. If you cannot take anticoagulants, other options may be available, such as wearing compression stockings or using different types of devices. SEEK IMMEDIATE MEDICAL CARE IF:  You have new or increased pain, swelling, or redness in an arm or leg.  You have numbness or tingling in an arm or leg.  You have shortness of breath while active or at rest.  You have chest pain.  You have a rapid or irregular heartbeat.  You feel light-headed or dizzy.  You cough up blood.  You notice blood in your vomit, bowel movement, or urine.  You have a fever. These symptoms may represent a serious problem that is an emergency.  Do not wait to see if the symptoms will go away. Get medical help right away. Call your local emergency services (911 in the [place] Do not drive yourself to the hospital.   This information is not intended to replace advice given to you by your health care provider. Make sure you discuss any questions you have with your health care provider.   Document Released: 01/15/2000 Document Revised: 10/08/2014 Document Reviewed: 05/14/2014 Elsevier Interactive Patient Education 2016 [employer] ° °

## 2014-11-14 ENCOUNTER — Ambulatory Visit: Payer: Self-pay | Admitting: Internal Medicine

## 2014-11-15 ENCOUNTER — Encounter (HOSPITAL_COMMUNITY): Payer: Self-pay

## 2014-11-15 ENCOUNTER — Emergency Department (HOSPITAL_COMMUNITY): Payer: Medicare Other

## 2014-11-15 ENCOUNTER — Emergency Department (HOSPITAL_COMMUNITY)
Admission: EM | Admit: 2014-11-15 | Discharge: 2014-11-15 | Disposition: A | Discharge status: Home / Self Care | Payer: Medicare Other | Attending: Emergency Medicine | Admitting: Emergency Medicine

## 2014-11-15 DIAGNOSIS — Z7982 Long term (current) use of aspirin: Secondary | ICD-10-CM | POA: Insufficient documentation

## 2014-11-15 DIAGNOSIS — Z7901 Long term (current) use of anticoagulants: Secondary | ICD-10-CM | POA: Insufficient documentation

## 2014-11-15 DIAGNOSIS — Z79899 Other long term (current) drug therapy: Secondary | ICD-10-CM | POA: Diagnosis not present

## 2014-11-15 DIAGNOSIS — Z86718 Personal history of other venous thrombosis and embolism: Secondary | ICD-10-CM | POA: Diagnosis not present

## 2014-11-15 DIAGNOSIS — E876 Hypokalemia: Secondary | ICD-10-CM | POA: Insufficient documentation

## 2014-11-15 DIAGNOSIS — R52 Pain, unspecified: Secondary | ICD-10-CM

## 2014-11-15 DIAGNOSIS — I10 Essential (primary) hypertension: Secondary | ICD-10-CM | POA: Diagnosis not present

## 2014-11-15 DIAGNOSIS — Z8619 Personal history of other infectious and parasitic diseases: Secondary | ICD-10-CM | POA: Diagnosis not present

## 2014-11-15 DIAGNOSIS — D5700 Hb-SS disease with crisis, unspecified: Secondary | ICD-10-CM | POA: Diagnosis not present

## 2014-11-15 DIAGNOSIS — M549 Dorsalgia, unspecified: Secondary | ICD-10-CM | POA: Diagnosis not present

## 2014-11-15 DIAGNOSIS — Z87891 Personal history of nicotine dependence: Secondary | ICD-10-CM | POA: Insufficient documentation

## 2014-11-15 DIAGNOSIS — Q8901 Asplenia (congenital): Secondary | ICD-10-CM | POA: Diagnosis not present

## 2014-11-15 DIAGNOSIS — Z86711 Personal history of pulmonary embolism: Secondary | ICD-10-CM | POA: Insufficient documentation

## 2014-11-15 LAB — CBC WITH DIFFERENTIAL/PLATELET
Basophils Absolute: 0.1 10*3/uL (ref 0.0–0.1)
Basophils Relative: 1 %
EOS ABS: 0.4 10*3/uL (ref 0.0–0.7)
Eosinophils Relative: 2 %
HCT: 22.1 % — ABNORMAL LOW (ref 39.0–52.0)
HEMOGLOBIN: 7.5 g/dL — AB (ref 13.0–17.0)
LYMPHS ABS: 1.8 10*3/uL (ref 0.7–4.0)
LYMPHS PCT: 8 %
MCH: 31.4 pg (ref 26.0–34.0)
MCHC: 33.9 g/dL (ref 30.0–36.0)
MCV: 92.5 fL (ref 78.0–100.0)
MONOS PCT: 11 %
Monocytes Absolute: 2.3 10*3/uL — ABNORMAL HIGH (ref 0.1–1.0)
Neutro Abs: 16.5 10*3/uL — ABNORMAL HIGH (ref 1.7–7.7)
Neutrophils Relative %: 78 %
Platelets: 471 10*3/uL — ABNORMAL HIGH (ref 150–400)
RBC: 2.39 MIL/uL — AB (ref 4.22–5.81)
RDW: 20.3 % — ABNORMAL HIGH (ref 11.5–15.5)
WBC: 21.1 10*3/uL — ABNORMAL HIGH (ref 4.0–10.5)

## 2014-11-15 LAB — BASIC METABOLIC PANEL
Anion gap: 7 (ref 5–15)
BUN: 9 mg/dL (ref 6–20)
CHLORIDE: 107 mmol/L (ref 101–111)
CO2: 22 mmol/L (ref 22–32)
CREATININE: 0.54 mg/dL — AB (ref 0.61–1.24)
Calcium: 9 mg/dL (ref 8.9–10.3)
GFR calc Af Amer: >60 mL/min (ref >60)
GFR calc non Af Amer: >60 mL/min (ref >60)
GLUCOSE: 108 mg/dL — AB (ref 65–99)
POTASSIUM: 3.5 mmol/L (ref 3.5–5.1)
SODIUM: 136 mmol/L (ref 135–145)

## 2014-11-15 LAB — SEDIMENTATION RATE: SED RATE: 35 mm/h — AB (ref 0–16)

## 2014-11-15 LAB — RETICULOCYTES
RBC.: 2.39 MIL/uL — ABNORMAL LOW (ref 4.22–5.81)
RETIC CT PCT: 22.3 % — AB (ref 0.4–3.1)
Retic Count, Absolute: 533 10*3/uL — ABNORMAL HIGH (ref 19.0–186.0)

## 2014-11-15 MED ORDER — HYDROMORPHONE HCL 2 MG/ML IJ SOLN
2.0000 mg | INTRAMUSCULAR | Status: DC | PRN
  Administered 2014-11-15 (×3): 2 mg via INTRAVENOUS
  Filled 2014-11-15 (×3): qty 1

## 2014-11-15 MED ORDER — HEPARIN SOD (PORK) LOCK FLUSH 100 UNIT/ML IV SOLN
500.0000 [IU] | Freq: Once | INTRAVENOUS | Status: AC
  Administered 2014-11-15: 500 [IU]
  Filled 2014-11-15: qty 5

## 2014-11-15 MED ORDER — DIPHENHYDRAMINE HCL 50 MG/ML IJ SOLN
25.0000 mg | Freq: Once | INTRAMUSCULAR | Status: AC
  Administered 2014-11-15: 25 mg via INTRAVENOUS
  Filled 2014-11-15: qty 1

## 2014-11-15 NOTE — Discharge Instructions (Signed)
It is important for you to follow-up with your doctor for regularly scheduled appointment on Monday for reevaluation. If you begin to experience any fevers, chills, cough, shortness of breath, chest pain or worsening pain it is important for you to return to the ED immediately for reevaluation.

## 2014-11-15 NOTE — ED Notes (Signed)
Patient transported to X-ray 

## 2014-11-15 NOTE — ED Notes (Signed)
WENT TO COLLECT BLOOD SAMPLES,PT HAS A PORT FOR ACCESS. RN WILL COLLECT

## 2014-11-15 NOTE — ED Notes (Signed)
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 9/10 Pt. fidgeting and almost tearful in triage. States that it started last night, he took his prescribed pain meds (dilaudid) this morning with no relief.

## 2014-11-15 NOTE — ED Provider Notes (Signed)
CSN: [id number]     Arrival date & time 11/15/14  1941 History   First MD Initiated Contact with Patient 11/15/14 [phone]     Chief Complaint  Patient presents with  . Sickle Cell Pain Crisis     (Consider location/radiation/quality/duration/timing/severity/associated sxs/prior Treatment) HPI [name] is a 34 y.o. male with a history of sickle cell anemia, pulmonary embolus, acute chest syndrome, pulmonary hypertension, comes in for evaluation of sickle cell pain crisis. Current pain crisis started this morning at approximately 1:00 AM. Not relieved by home medications. Located to bilateral lower back. Patient has any fevers, chills, chest pain, shortness of breath, cough. Pain is rated as constant and severe.  Past Medical History  Diagnosis Date  . Sickle cell anemia (HCC)   . Blood transfusion   . Acute embolism and thrombosis of right internal jugular vein ([place])   . Hypokalemia   . Mood disorder ([place])   . History of pulmonary embolus (PE)   . Avascular necrosis ([place])   . Leukocytosis     Chronic  . Thrombocytosis (HCC)     Chronic  . Hypertension   . History of Clostridium difficile infection   . Uses marijuana   . Chronic anticoagulation   . Functional asplenia   . Former smoker   . Second hand tobacco smoke exposure   . Alcohol consumption of one to four drinks per day   . Noncompliance with medication regimen   . Sickle-cell crisis with associated acute chest syndrome ([place]) 05/13/2013  . Acute chest syndrome ([place]) 06/18/2013  . Demand ischemia ([place]) 01/02/2014   Past Surgical History  Procedure Laterality Date  . Right hip replacement      08/2006  . Cholecystectomy      01/2008  . Porta cath placement    . Porta cath removal    . Umbilical hernia repair      01/2008  . Excision of left periauricular cyst      10/2009  . Excision of right ear lobe cyst with primary closur      11/2007  . Portacath placement  01/05/2012    Procedure: INSERTION PORT-A-CATH;   Surgeon: Odis Hollingshead, MD;  Location: [place];  Service: General;  Laterality: N/A;  ultrasound guiced port a cath insertion with fluoroscopy   Family History  Problem Relation Age of Onset  . Sickle cell trait Mother   . Depression Mother   . Diabetes Mother   . Sickle cell trait Father   . Sickle cell trait Brother    Social History  Substance Use Topics  . Smoking status: Former Smoker -- 13 years    Quit date: 07/08/2010  . Smokeless tobacco: Never Used  . Alcohol Use: No    Review of Systems A 10 point review of systems was completed and was negative except for pertinent positives and negatives as mentioned in the history of present illness     Allergies  Review of patient's allergies indicates no known allergies.  Home Medications   Prior to Admission medications   Medication Sig Start Date End Date Taking? Authorizing Provider  aspirin 81 MG chewable tablet Chew 1 tablet (81 mg total) by mouth daily. 07/23/14  Yes [name], MD  Cholecalciferol (VITAMIN D) 2000 UNITS tablet Take 1 tablet (2,000 Units total) by mouth daily. 02/13/14  Yes [name], MD  diltiazem (CARDIZEM CD) 120 MG 24 hr capsule Take 1 capsule (120 mg total) by mouth daily. 03/13/14  Yes Costin Karlyne Greenspan, MD  enoxaparin (LOVENOX) 150 MG/ML injection Inject 0.73 mLs (110 mg total) into the skin daily. 09/26/14  Yes Micheline Chapman, NP  folic acid (FOLVITE) 1 MG tablet Take 1 tablet (1 mg total) by mouth every morning. 10/08/13  Yes [name], MD  HYDROmorphone (DILAUDID) 4 MG tablet Take 1 tablet (4 mg total) by mouth every 4 (four) hours as needed for severe pain. 11/06/14  Yes Dorena Dew, FNP  hydroxyurea (HYDREA) 500 MG capsule Take 1 capsule (500 mg total) by mouth daily. May take with food to minimize GI side effects. 11/06/14  Yes Dorena Dew, FNP  lisinopril (PRINIVIL,ZESTRIL) 10 MG tablet Take 1 tablet (10 mg total) by mouth daily. 01/22/14  Yes [name], MD  metoprolol succinate (TOPROL-XL) 25 MG 24 hr tablet Take 25 mg by mouth daily.   Yes Historical Provider, MD  morphine (MS CONTIN) 30 MG 12 hr tablet Take 1 tablet (30 mg total) by mouth every 12 (twelve) hours. 10/22/14  Yes Dorena Dew, FNP  potassium chloride SA (K-DUR,KLOR-CON) 20 MEQ tablet Take 1 tablet (20 mEq total) by mouth every morning. 06/09/14  Yes [name], MD  zolpidem (AMBIEN) 10 MG tablet Take 1 tablet (10 mg total) by mouth at bedtime as needed for sleep. 10/23/14  Yes Olugbemiga E Doreene Burke, MD   BP 119/78 mmHg  Pulse 88  Temp(Src) 98 F (36.7 C) (Oral)  Resp 19  Ht 6' (1.829 m)  Wt 163 lb (73.936 kg)  BMI 22.10 kg/m2  SpO2 92% Physical Exam  Constitutional: He is oriented to person, place, and time. He appears well-developed and well-nourished.  HENT:  Head: Normocephalic and atraumatic.  Mouth/Throat: Oropharynx is clear and moist.  Eyes: Conjunctivae are normal. Pupils are equal, round, and reactive to light. Right eye exhibits no discharge. Left eye exhibits no discharge. No scleral icterus.  Neck: Neck supple.  Cardiovascular: Normal rate, regular rhythm and normal heart sounds.   Pulmonary/Chest: Effort normal and breath sounds normal. No respiratory distress. He has no wheezes. He has no rales.  Abdominal: Soft. There is no tenderness.  Musculoskeletal: Normal range of motion.  Bilateral lower, paraspinal back pain. No crepitus, erythema or other lesions or deformities. Full range of motion.  Neurological: He is alert and oriented to person, place, and time.  Cranial Nerves II-XII grossly intact  Skin: Skin is warm and dry. No rash noted.  Psychiatric: He has a normal mood and affect.  Nursing note and vitals reviewed.   ED Course  Procedures (including critical care time) Labs Review Labs Reviewed  CBC WITH DIFFERENTIAL/PLATELET - Abnormal; Notable for the following:    WBC 21.1 (*)    RBC 2.39 (*)    Hemoglobin 7.5 (*)     HCT 22.1 (*)    RDW 20.3 (*)    Platelets 471 (*)    Neutro Abs 16.5 (*)    Monocytes Absolute 2.3 (*)    All other components within normal limits  BASIC METABOLIC PANEL - Abnormal; Notable for the following:    Glucose, Bld 108 (*)    Creatinine, Ser 0.54 (*)    All other components within normal limits  RETICULOCYTES - Abnormal; Notable for the following:    Retic Ct Pct 22.3 (*)    RBC. 2.39 (*)    Retic Count, Manual 533.0 (*)    All other components within normal limits  SEDIMENTATION RATE - Abnormal; Notable  for the following:    Sed Rate 35 (*)    All other components within normal limits    Imaging Review Dg Chest 2 View  11/15/2014  CLINICAL DATA:  Back pain.  Sickle cell anemia. EXAM: CHEST  2 VIEW COMPARISON:  11/13/2014 FINDINGS: Cardiomegaly. Left subclavian vein Port-A-Cath is stable. No pneumothorax. Chronic changes at the lung bases. IMPRESSION: No active cardiopulmonary disease. Electronically Signed   By: Marybelle Killings M.D.   On: 11/15/2014 08:52   Dg Lumbar Spine Complete  11/15/2014  CLINICAL DATA:  Sickle cell pain crisis EXAM: LUMBAR SPINE - COMPLETE 4+ VIEW COMPARISON:  None. FINDINGS: Five lumbar type vertebral bodies. Straightening of the lumbar spine. No evidence of fracture or dislocation. Vertebral body heights are maintained. Mild superior/ inferior endplate changes at L5 characteristic of sickle cell. Visualized bony pelvis appears intact. IMPRESSION: No fracture or dislocation is seen. Electronically Signed   By: Julian Hy M.D.   On: 11/15/2014 11:22   Dg Pelvis 1-2 Views  11/15/2014  CLINICAL DATA:  Sickle cell pain crisis EXAM: PELVIS - 1-2 VIEW COMPARISON:  CT right hip dated 10/27/2013 FINDINGS: Right hip arthroplasty in satisfactory position. No fracture or dislocation is seen. Left hip joint space is preserved. Visualized bony pelvis appears intact. IMPRESSION: Right hip arthroplasty satisfactory position. No fracture or dislocation is seen.  Electronically Signed   By: Julian Hy M.D.   On: 11/15/2014 11:19   Ct Angio Chest Pe W/cm &/or Wo Cm  11/13/2014  CLINICAL DATA:  Chest pain.  Sickle cell disease EXAM: CT ANGIOGRAPHY CHEST WITH CONTRAST TECHNIQUE: Multidetector CT imaging of the chest was performed using the standard protocol during bolus administration of intravenous contrast. Multiplanar CT image reconstructions and MIPs were obtained to evaluate the vascular anatomy. CONTRAST:  11m OMNIPAQUE IOHEXOL 350 MG/ML SOLN COMPARISON:  Chest CT October 26, 2014 and November 13, 2014 chest radiograph FINDINGS: There is a small pulmonary embolus in the posterior segment right lower lobe pulmonary artery. No more central pulmonary emboli are identified. No evidence of right heart strain. There is no thoracic aortic aneurysm or dissection. The visualized great vessels appear normal. There is a small area of patchy infiltrate in the anterior segment of the left upper lobe, stable. There is patchy atelectatic change in both bases, stable. No new opacity is appreciable. Cardiomegaly is stable.  The pericardium is not thickened. Visualized thyroid appears normal. Anterior mediastinal adenopathy remains, stable. The largest individual lymph node in the anterior mediastinum measures 2.3 x 1.5 cm, unchanged. There is a single prominent lymph node in the right hilum measuring 1.6 x 1.2 cm. There is no new adenopathy. Central catheter tip is near the cavoatrial junction. In the visualized upper abdomen, there is diffuse calcification of the spleen consistent with a degree of autoinfarction, stable. 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 Bones overall are sclerotic consistent with the chronic anemia. There is a probable sebaceous cyst measuring 2.1 x 1.1 cm in the right anterior chest wall, stable. Review of the MIP images confirms the above findings. IMPRESSION: Small pulmonary embolus in a  posterior segment right lower lobe pulmonary artery branch, not present recently and presumed new. No other pulmonary emboli identified. Stable cardiomegaly and anterior mediastinal adenopathy. Single prominent lymph node in the right hilum. Stable areas of patchy lower lobe atelectasis and mild patchy infiltrate in the left upper lobe. No new parenchymal lung opacity. Stable bony changes consistent with  sickle cell anemia. Evidence of prior splenic autoinfarction. Critical Value/emergent results were called by telephone at the time of interpretation on 11/13/2014 at 12:32 pm to Dr. Joseph Berkshire , who verbally acknowledged these results. Electronically Signed   By: Lowella Grip III M.D.   On: 11/13/2014 12:34   I have personally reviewed and evaluated these images and lab results as part of my medical decision-making.   EKG Interpretation None     Meds given in ED:  Medications  HYDROmorphone (DILAUDID) injection 2 mg (2 mg Intravenous Given 11/15/14 1015)  diphenhydrAMINE (BENADRYL) injection 25 mg (25 mg Intravenous Given 11/15/14 1015)    New Prescriptions   No medications on file   Filed Vitals:   11/15/14 0805 11/15/14 0945  BP: 124/81 119/78  Pulse: 89 88  Temp: 98.2 F (36.8 C) 98 F (36.7 C)  TempSrc: Oral Oral  Resp:  19  Height: 6' (1.829 m)   Weight: 163 lb (73.936 kg)   SpO2: 95% 92%    MDM  Vitals stable - WNL -afebrile Pt resting comfortably in ED. PE--physical exam is grossly unremarkable. Patient has diffuse lower back tenderness with no lesions or deformities, no crepitus or bony step-offs. Benign cardiopulmonary exams. Labwork--patient's hemoglobin appears to be baseline, slight elevation in reticulocyte count. Patient also has significant increase in WBCs at 21.1 up from 16.6  two days ago. Imaging--chest x-ray shows no acute cardiopulmonary pathology Due to patient's past medical history, he is at risk for developing osteomyelitis. Will obtain  plain films of the hip and lumbar spine as well as ESR. If negative, I feel patient may be discharged home. States he has a follow-up appointment with his doctor on Monday. Declines medical admission at this time. Plain films of hip and lumbar spine are negative. ESR is 35. Patient is requesting to be discharged. Encouraged patient to follow up with his PCP on Monday for regularly scheduled appointment for reevaluation. Strict return precautions given and patient verbalizes understanding. Prior to patient discharge, I discussed and reviewed this case with Dr.Liu, who agrees with above plan.  I discussed all relevant lab findings and imaging results with pt and they verbalized understanding. Discussed f/u with PCP within 48 hrs and return precautions, pt very amenable to plan. No evidence of other acute or emergent pathology at this time. Final diagnoses:  Sickle cell pain crisis [facility], [place])       Comer Locket, PA-C 11/15/14 [place] Liu, MD 11/15/14 [phone]

## 2014-11-17 ENCOUNTER — Telehealth (HOSPITAL_COMMUNITY): Payer: Self-pay | Admitting: *Deleted

## 2014-11-17 ENCOUNTER — Non-Acute Institutional Stay (HOSPITAL_COMMUNITY)
Admission: AD | Admit: 2014-11-17 | Discharge: 2014-11-17 | Disposition: A | Discharge status: Home / Self Care | Payer: Medicare Other | Source: Ambulatory Visit | Attending: Internal Medicine | Admitting: Internal Medicine

## 2014-11-17 ENCOUNTER — Encounter (HOSPITAL_COMMUNITY): Payer: Self-pay | Admitting: *Deleted

## 2014-11-17 DIAGNOSIS — Z79899 Other long term (current) drug therapy: Secondary | ICD-10-CM | POA: Diagnosis not present

## 2014-11-17 DIAGNOSIS — R109 Unspecified abdominal pain: Secondary | ICD-10-CM | POA: Diagnosis present

## 2014-11-17 DIAGNOSIS — D5700 Hb-SS disease with crisis, unspecified: Secondary | ICD-10-CM | POA: Diagnosis not present

## 2014-11-17 DIAGNOSIS — Z7901 Long term (current) use of anticoagulants: Secondary | ICD-10-CM | POA: Insufficient documentation

## 2014-11-17 DIAGNOSIS — Z86711 Personal history of pulmonary embolism: Secondary | ICD-10-CM | POA: Insufficient documentation

## 2014-11-17 DIAGNOSIS — Z7982 Long term (current) use of aspirin: Secondary | ICD-10-CM | POA: Diagnosis not present

## 2014-11-17 DIAGNOSIS — I10 Essential (primary) hypertension: Secondary | ICD-10-CM | POA: Insufficient documentation

## 2014-11-17 DIAGNOSIS — Z87891 Personal history of nicotine dependence: Secondary | ICD-10-CM | POA: Insufficient documentation

## 2014-11-17 DIAGNOSIS — Z79891 Long term (current) use of opiate analgesic: Secondary | ICD-10-CM | POA: Insufficient documentation

## 2014-11-17 LAB — COMPREHENSIVE METABOLIC PANEL
ALT: 32 U/L (ref 17–63)
AST: 39 U/L (ref 15–41)
Albumin: 4.2 g/dL (ref 3.5–5.0)
Alkaline Phosphatase: 83 U/L (ref 38–126)
Anion gap: 7 (ref 5–15)
BUN: 8 mg/dL (ref 6–20)
CHLORIDE: 108 mmol/L (ref 101–111)
CO2: 23 mmol/L (ref 22–32)
Calcium: 9 mg/dL (ref 8.9–10.3)
Creatinine, Ser: 0.65 mg/dL (ref 0.61–1.24)
Glucose, Bld: 123 mg/dL — ABNORMAL HIGH (ref 65–99)
POTASSIUM: 3.5 mmol/L (ref 3.5–5.1)
Sodium: 138 mmol/L (ref 135–145)
Total Bilirubin: 6.2 mg/dL — ABNORMAL HIGH (ref 0.3–1.2)
Total Protein: 8.1 g/dL (ref 6.5–8.1)

## 2014-11-17 LAB — CBC WITH DIFFERENTIAL/PLATELET
Band Neutrophils: 0 %
Basophils Absolute: 0 10*3/uL (ref 0.0–0.1)
Basophils Relative: 0 %
Blasts: 0 %
EOS PCT: 2 %
Eosinophils Absolute: 0.4 10*3/uL (ref 0.0–0.7)
HEMATOCRIT: 24.3 % — AB (ref 39.0–52.0)
HEMOGLOBIN: 8.1 g/dL — AB (ref 13.0–17.0)
LYMPHS ABS: 3.2 10*3/uL (ref 0.7–4.0)
Lymphocytes Relative: 15 %
MCH: 31.3 pg (ref 26.0–34.0)
MCHC: 33.3 g/dL (ref 30.0–36.0)
MCV: 93.8 fL (ref 78.0–100.0)
MONOS PCT: 13 %
Metamyelocytes Relative: 0 %
Monocytes Absolute: 2.7 10*3/uL — ABNORMAL HIGH (ref 0.1–1.0)
Myelocytes: 0 %
NEUTROS ABS: 14.8 10*3/uL — AB (ref 1.7–7.7)
NEUTROS PCT: 70 %
NRBC: 0 /100{WBCs}
OTHER: 0 %
Platelets: 484 10*3/uL — ABNORMAL HIGH (ref 150–400)
Promyelocytes Absolute: 0 %
RBC: 2.59 MIL/uL — AB (ref 4.22–5.81)
RDW: 22.3 % — ABNORMAL HIGH (ref 11.5–15.5)
WBC: 21.1 10*3/uL — AB (ref 4.0–10.5)

## 2014-11-17 LAB — LACTATE DEHYDROGENASE: LDH: 427 U/L — ABNORMAL HIGH (ref 98–192)

## 2014-11-17 MED ORDER — HEPARIN SOD (PORK) LOCK FLUSH 100 UNIT/ML IV SOLN
500.0000 [IU] | INTRAVENOUS | Status: AC | PRN
  Administered 2014-11-17: 500 [IU]
  Filled 2014-11-17: qty 5

## 2014-11-17 MED ORDER — SODIUM CHLORIDE 0.9 % IJ SOLN: 9.0000 mL | INTRAMUSCULAR | Status: DC | PRN

## 2014-11-17 MED ORDER — HYDROMORPHONE 1 MG/ML IV SOLN
INTRAVENOUS | Status: DC
  Administered 2014-11-17: 10:00:00 via INTRAVENOUS
  Administered 2014-11-17: 11.9 mg via INTRAVENOUS
  Administered 2014-11-17: 5.6 mg via INTRAVENOUS

## 2014-11-17 MED ORDER — DEXTROSE-NACL 5-0.45 % IV SOLN
INTRAVENOUS | Status: DC
  Administered 2014-11-17: 09:00:00 via INTRAVENOUS

## 2014-11-17 MED ORDER — SODIUM CHLORIDE 0.9 % IJ SOLN
10.0000 mL | INTRAMUSCULAR | Status: AC | PRN
  Administered 2014-11-17: 10 mL

## 2014-11-17 MED ORDER — KETOROLAC TROMETHAMINE 30 MG/ML IJ SOLN
30.0000 mg | Freq: Once | INTRAMUSCULAR | Status: AC
  Administered 2014-11-17: 30 mg via INTRAVENOUS
  Filled 2014-11-17: qty 1

## 2014-11-17 MED ORDER — SODIUM CHLORIDE 0.9 % IV SOLN
12.5000 mg | Freq: Four times a day (QID) | INTRAVENOUS | Status: DC | PRN
  Administered 2014-11-17: 12.5 mg via INTRAVENOUS
  Filled 2014-11-17 (×3): qty 0.25

## 2014-11-17 MED ORDER — ONDANSETRON HCL 4 MG/2ML IJ SOLN: 4.0000 mg | Freq: Four times a day (QID) | INTRAMUSCULAR | Status: DC | PRN

## 2014-11-17 MED ORDER — NALOXONE HCL 0.4 MG/ML IJ SOLN: 0.4000 mg | INTRAMUSCULAR | Status: DC | PRN

## 2014-11-17 MED ORDER — DIPHENHYDRAMINE HCL 12.5 MG/5ML PO ELIX: 12.5000 mg | ORAL_SOLUTION | Freq: Four times a day (QID) | ORAL | Status: DC | PRN

## 2014-11-17 NOTE — Progress Notes (Signed)
Pt received to the [name] for pain control. Pt placed on a Dilaudid PCA and IV fluids. Pt's pain was 8/10 and down to 4/10 at discharge. Pt's O2 sat was down in the 80's but was placed on Oxygen at 2/L per [place]. Pt's O2 sat 95 at discharge without O2. Discharge instructions given to patient with verbal understanding. Reminded to cont to take meds as prescribed and use his oxygen at home.

## 2014-11-17 NOTE — Progress Notes (Signed)
O2 sat dropping to 87 at times. Place on 1 liter of oxygen. Will notify provider.

## 2014-11-17 NOTE — H&P (Signed)
[facility] History and Physical   Date: 11/17/2014  Patient name: [name] Medical record number: [id number] Date of birth: Jun 27, 1980 Age: 34 y.o. Gender: male PCP: [name], MD  Attending physician: Tresa Garter, MD  Chief Complaint: Bilateral flank pain  History of Present Illness: [name], a 34 year old male with a history of sickle cell anemia presents for bilateral flank pain. He states that pain has been present for the past week. Patient has had multiple emergency room visits over the past week. Patient had a follow-up scheduled for primary care on Friday, 11/14/2014 for an elevated troponin in the emergency on 11/13/2014, he did not show up for appointment. He states that he did not come to appointment with Dr. Doreene Burke due to work constraints. Patient has had several consultations scheduled, including hematology and cardiology, but has no showed appointments. He states that current pain intensity is 8/10 primarily to right flank, which is consistent with typical sickle cell pain. He reports that he last had pain medication around 6 am with minimal relief. Patient currently denies headache, shortness of breath, chest pain, fever, heart palpitations, fatigue, abdominal pain, urinary problems, nausea, vomiting, or diarrhea.   Meds: Prescriptions prior to admission  Medication Sig Dispense Refill Last Dose  . Cholecalciferol (VITAMIN D) 2000 UNITS tablet Take 1 tablet (2,000 Units total) by mouth daily. 90 tablet 3 11/16/2014 at Unknown time  . diltiazem (CARDIZEM CD) 120 MG 24 hr capsule Take 1 capsule (120 mg total) by mouth daily. 30 capsule 1 11/16/2014 at Unknown time  . enoxaparin (LOVENOX) 150 MG/ML injection Inject 0.73 mLs (110 mg total) into the skin daily. 22 Syringe 5 11/16/2014 at Unknown time  . folic acid (FOLVITE) 1 MG tablet Take 1 tablet (1 mg total) by mouth every morning. 30 tablet 11 11/16/2014 at Unknown time  .  HYDROmorphone (DILAUDID) 4 MG tablet Take 1 tablet (4 mg total) by mouth every 4 (four) hours as needed for severe pain. 90 tablet 0 11/17/2014 at Unknown time  . hydroxyurea (HYDREA) 500 MG capsule Take 1 capsule (500 mg total) by mouth daily. May take with food to minimize GI side effects. 30 capsule 0 11/16/2014 at Unknown time  . lisinopril (PRINIVIL,ZESTRIL) 10 MG tablet Take 1 tablet (10 mg total) by mouth daily. 30 tablet 1 11/16/2014 at Unknown time  . metoprolol succinate (TOPROL-XL) 25 MG 24 hr tablet Take 25 mg by mouth daily.   11/16/2014 at Unknown time  . morphine (MS CONTIN) 30 MG 12 hr tablet Take 1 tablet (30 mg total) by mouth every 12 (twelve) hours. 60 tablet 0 11/16/2014 at Unknown time  . potassium chloride SA (K-DUR,KLOR-CON) 20 MEQ tablet Take 1 tablet (20 mEq total) by mouth every morning. 30 tablet 3 11/16/2014 at Unknown time  . zolpidem (AMBIEN) 10 MG tablet Take 1 tablet (10 mg total) by mouth at bedtime as needed for sleep. 30 tablet 0 Past Week at Unknown time  . aspirin 81 MG chewable tablet Chew 1 tablet (81 mg total) by mouth daily. 30 tablet 11 Unknown at Unknown time    Allergies: Review of patient's allergies indicates no known allergies. Past Medical History  Diagnosis Date  . Sickle cell anemia (HCC)   . Blood transfusion   . Acute embolism and thrombosis of right internal jugular vein ([place])   . Hypokalemia   . Mood disorder ([place])   . History of pulmonary embolus (PE)   .  Avascular necrosis ([place])   . Leukocytosis     Chronic  . Thrombocytosis (HCC)     Chronic  . Hypertension   . History of Clostridium difficile infection   . Uses marijuana   . Chronic anticoagulation   . Functional asplenia   . Former smoker   . Second hand tobacco smoke exposure   . Alcohol consumption of one to four drinks per day   . Noncompliance with medication regimen   . Sickle-cell crisis with associated acute chest syndrome ([place]) 05/13/2013  . Acute chest syndrome  ([place]) 06/18/2013  . Demand ischemia ([place]) 01/02/2014   Past Surgical History  Procedure Laterality Date  . Right hip replacement      08/2006  . Cholecystectomy      01/2008  . Porta cath placement    . Porta cath removal    . Umbilical hernia repair      01/2008  . Excision of left periauricular cyst      10/2009  . Excision of right ear lobe cyst with primary closur      11/2007  . Portacath placement  01/05/2012    Procedure: INSERTION PORT-A-CATH;  Surgeon: Odis Hollingshead, MD;  Location: [place];  Service: General;  Laterality: N/A;  ultrasound guiced port a cath insertion with fluoroscopy   Family History  Problem Relation Age of Onset  . Sickle cell trait Mother   . Depression Mother   . Diabetes Mother   . Sickle cell trait Father   . Sickle cell trait Brother    Social History   Social History  . Marital Status: Single    Spouse Name: N/A  . Number of Children: 0  . Years of Education: 13   Occupational History  . 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 Smoking status: Former Smoker -- 13 years    Quit date: 07/08/2010  . Smokeless tobacco: Never Used  . Alcohol Use: No  . Drug Use: 2.00 per week    Special: Marijuana  . Sexual Activity:    Partners: Female    Museum/gallery curator: None     Comment: month ago   Other Topics Concern  . Not on file   Social History Narrative   Lives in an apartment.  Single.  Lives alone but has a girlfriend that helps care for him.  Does not use any assist devices.        [name]:  [phone] Mom, emergency contact    Review of Systems: Constitutional: negative for fatigue and fevers Eyes: positive for icterus Ears, nose, mouth, throat, and face: negative Respiratory: negative for cough, dyspnea on exertion and wheezing Cardiovascular: negative for chest pain, chest pressure/discomfort, dyspnea, fatigue, lower extremity edema, near-syncope,  palpitations and tachypnea Gastrointestinal: negative for abdominal pain and constipation Genitourinary:negative Integument/breast: negative Hematologic/lymphatic: negative Musculoskeletal:positive for myalgias Neurological: negative Behavioral/Psych: negative Endocrine: negative Allergic/Immunologic: negative  Physical Exam: Blood pressure 113/75, pulse 91, temperature 98.7 F (37.1 C), temperature source Oral, resp. rate 20, height 6' (1.829 m), weight 165 lb (74.844 kg), SpO2 95 %. BP 103/70 mmHg  Pulse 80  Temp(Src) 98.4 F (36.9 C) (Oral)  Resp 22  Ht 6' (1.829 m)  Wt 165 lb (74.844 kg)  BMI 22.37 kg/m2  SpO2 94%  General Appearance:    Alert, cooperative, mild distress, appears stated age  Head:    Normocephalic, without obvious abnormality,  atraumatic  Eyes:    PERRL, icterus, EOM's intact, fundi    benign, both eyes       Ears:    Normal TM's and external ear canals, both ears  Nose:   Nares normal, septum midline, mucosa normal, no drainage    or sinus tenderness  Throat:   Lips, mucosa, and tongue normal; teeth and gums normal  Neck:   Supple, symmetrical, trachea midline, no adenopathy;       thyroid:  No enlargement/tenderness/nodules; no carotid   bruit or JVD  Back:     Symmetric, no curvature, ROM normal, no CVA tenderness  Lungs:     Clear to auscultation bilaterally, respirations unlabored  Chest wall:    No tenderness to chest wall or deformity  Heart:     S1 and S2 normal, no murmur, rub   or gallop  Abdomen:     Soft, non-tender, bowel sounds active all four quadrants,    no masses, no organomegaly  Extremities:   Extremities normal, atraumatic, no cyanosis or edema  Pulses:   2+ and symmetric all extremities  Skin:   Skin color, texture, turgor normal, no rashes or lesions  Lymph nodes:   Cervical, supraclavicular, and axillary nodes normal  Neurologic:   CNII-XII intact. Normal strength, sensation and reflexes      throughout    Lab results: No  results found. However, due to the size of the patient record, not all encounters were searched. Please check Results Review for a complete set of results.  Imaging results:  Dg Lumbar Spine Complete  11/15/2014  CLINICAL DATA:  Sickle cell pain crisis EXAM: LUMBAR SPINE - COMPLETE 4+ VIEW COMPARISON:  None. FINDINGS: Five lumbar type vertebral bodies. Straightening of the lumbar spine. No evidence of fracture or dislocation. Vertebral body heights are maintained. Mild superior/ inferior endplate changes at L5 characteristic of sickle cell. Visualized bony pelvis appears intact. IMPRESSION: No fracture or dislocation is seen. Electronically Signed   By: Julian Hy M.D.   On: 11/15/2014 11:22   Dg Pelvis 1-2 Views  11/15/2014  CLINICAL DATA:  Sickle cell pain crisis EXAM: PELVIS - 1-2 VIEW COMPARISON:  CT right hip dated 10/27/2013 FINDINGS: Right hip arthroplasty in satisfactory position. No fracture or dislocation is seen. Left hip joint space is preserved. Visualized bony pelvis appears intact. IMPRESSION: Right hip arthroplasty satisfactory position. No fracture or dislocation is seen. Electronically Signed   By: Julian Hy M.D.   On: 11/15/2014 11:19     Assessment & Plan:  Patient will be admitted to the day infusion center for extended observation  Start IV D5.45 for cellular rehydration at 125/hr  Start Toradol 30 mg IV every 6 hours for inflammation.  Start Dilaudid PCA High Concentration per weight based protocol.   Patient will be re-evaluated for pain intensity in the context of function and relationship to baseline as care progresses.  If no significant pain relief, will transfer patient to inpatient services for a higher level of care.   WBC from 11/15/2014 is 21.1, will repeat CBCw/differential, LDH, urinalysis, and CMP  Reviewed images from 11/15/2014  Kamdyn Colborn M 11/17/2014, 9:28 AM

## 2014-11-17 NOTE — Telephone Encounter (Signed)
Pt called back and told that he [facility].Pt stated that he could get here in 30 mins.

## 2014-11-17 NOTE — Telephone Encounter (Signed)
Pt called regarding sickle cell pain in both his sides. States the pain is throbbing and he took his last pain med at 6am.Told him that I would check with the provider and will call him back to let him know if he can come in this morning. Pt voiced understanding.

## 2014-11-17 NOTE — Progress Notes (Signed)
65mls of Dilaudid PCA wasted in sink. Witness by Lucien Mons, RN

## 2014-11-17 NOTE — Discharge Summary (Signed)
[facility] Discharge Summary   Patient ID: [name] MRN: [id number] DOB/AGE: Jun 17, 1980 34 y.o.  Admit date: 11/17/2014 Discharge date: 11/17/2014  Primary Care Physician:  [name], MD  Admission Diagnoses:  Active Problems:   Sickle cell anemia with pain [facility])    Discharge Medications:    Medication List    ASK your doctor about these medications        aspirin 81 MG chewable tablet  Chew 1 tablet (81 mg total) by mouth daily.     diltiazem 120 MG 24 hr capsule  Commonly known as:  CARDIZEM CD  Take 1 capsule (120 mg total) by mouth daily.     enoxaparin 150 MG/ML injection  Commonly known as:  LOVENOX  Inject 0.73 mLs (110 mg total) into the skin daily.     folic acid 1 MG tablet  Commonly known as:  FOLVITE  Take 1 tablet (1 mg total) by mouth every morning.     HYDROmorphone 4 MG tablet  Commonly known as:  DILAUDID  Take 1 tablet (4 mg total) by mouth every 4 (four) hours as needed for severe pain.     hydroxyurea 500 MG capsule  Commonly known as:  HYDREA  Take 1 capsule (500 mg total) by mouth daily. May take with food to minimize GI side effects.     lisinopril 10 MG tablet  Commonly known as:  PRINIVIL,ZESTRIL  Take 1 tablet (10 mg total) by mouth daily.     metoprolol succinate 25 MG 24 hr tablet  Commonly known as:  TOPROL-XL  Take 25 mg by mouth daily.     morphine 30 MG 12 hr tablet  Commonly known as:  MS CONTIN  Take 1 tablet (30 mg total) by mouth every 12 (twelve) hours.     potassium chloride SA 20 MEQ tablet  Commonly known as:  K-DUR,KLOR-CON  Take 1 tablet (20 mEq total) by mouth every morning.     Vitamin D 2000 UNITS tablet  Take 1 tablet (2,000 Units total) by mouth daily.     zolpidem 10 MG tablet  Commonly known as:  AMBIEN  Take 1 tablet (10 mg total) by mouth at bedtime as needed for sleep.         Consults:  None  Significant Diagnostic Studies:  Dg Chest 2 View  11/15/2014   CLINICAL DATA:  Back pain.  Sickle cell anemia. EXAM: CHEST  2 VIEW COMPARISON:  11/13/2014 FINDINGS: Cardiomegaly. Left subclavian vein Port-A-Cath is stable. No pneumothorax. Chronic changes at the lung bases. IMPRESSION: No active cardiopulmonary disease. Electronically Signed   By: Marybelle Killings M.D.   On: 11/15/2014 08:52   Dg Chest 2 View  11/13/2014  CLINICAL DATA:  Sickle cell pain crisis with left lateral chest wall pain EXAM: CHEST  2 VIEW COMPARISON:  10/26/2014 FINDINGS: Chronic cardiomegaly. Negative aortic and hilar contours. Stable left subclavian porta catheter placement, tip at the upper right atrium. Chronic interstitial coarsening consistent with scar. There is no edema, consolidation, effusion, or pneumothorax. No osseous findings to explain acute pain. Chronic humeral head osteonecrosis. Cholecystectomy. IMPRESSION: Stable lung scarring and cardiomegaly. No evidence of acute cardiopulmonary disease. Electronically Signed   By: Monte Fantasia M.D.   On: 11/13/2014 08:15   Dg Chest 2 View  10/26/2014  CLINICAL DATA:  Chronic sickle cell pain EXAM: CHEST  2 VIEW COMPARISON:  04/12/2014 FINDINGS: Cardiomegaly.  No frank interstitial edema. Mild patchy right basilar/right lower lobe  opacity, atelectasis versus pneumonia. No pleural effusion or pneumothorax. Left chest power port terminating at the cavoatrial junction. IMPRESSION: Mild patchy right lower lobe opacity, atelectasis versus pneumonia. Cardiomegaly.  No frank interstitial edema. Electronically Signed   By: Julian Hy M.D.   On: 10/26/2014 07:40   Dg Lumbar Spine Complete  11/15/2014  CLINICAL DATA:  Sickle cell pain crisis EXAM: LUMBAR SPINE - COMPLETE 4+ VIEW COMPARISON:  None. FINDINGS: Five lumbar type vertebral bodies. Straightening of the lumbar spine. No evidence of fracture or dislocation. Vertebral body heights are maintained. Mild superior/ inferior endplate changes at L5 characteristic of sickle cell.  Visualized bony pelvis appears intact. IMPRESSION: No fracture or dislocation is seen. Electronically Signed   By: Julian Hy M.D.   On: 11/15/2014 11:22   Dg Pelvis 1-2 Views  11/15/2014  CLINICAL DATA:  Sickle cell pain crisis EXAM: PELVIS - 1-2 VIEW COMPARISON:  CT right hip dated 10/27/2013 FINDINGS: Right hip arthroplasty in satisfactory position. No fracture or dislocation is seen. Left hip joint space is preserved. Visualized bony pelvis appears intact. IMPRESSION: Right hip arthroplasty satisfactory position. No fracture or dislocation is seen. Electronically Signed   By: Julian Hy M.D.   On: 11/15/2014 11:19   Ct Angio Chest Pe W/cm &/or Wo Cm  11/13/2014  CLINICAL DATA:  Chest pain.  Sickle cell disease EXAM: CT ANGIOGRAPHY CHEST WITH CONTRAST TECHNIQUE: Multidetector CT imaging of the chest was performed using the standard protocol during bolus administration of intravenous contrast. Multiplanar CT image reconstructions and MIPs were obtained to evaluate the vascular anatomy. CONTRAST:  129mL OMNIPAQUE IOHEXOL 350 MG/ML SOLN COMPARISON:  Chest CT October 26, 2014 and November 13, 2014 chest radiograph FINDINGS: There is a small pulmonary embolus in the posterior segment right lower lobe pulmonary artery. No more central pulmonary emboli are identified. No evidence of right heart strain. There is no thoracic aortic aneurysm or dissection. The visualized great vessels appear normal. There is a small area of patchy infiltrate in the anterior segment of the left upper lobe, stable. There is patchy atelectatic change in both bases, stable. No new opacity is appreciable. Cardiomegaly is stable.  The pericardium is not thickened. Visualized thyroid appears normal. Anterior mediastinal adenopathy remains, stable. The largest individual lymph node in the anterior mediastinum measures 2.3 x 1.5 cm, unchanged. There is a single prominent lymph node in the right hilum measuring 1.6 x 1.2 cm.  There is no new adenopathy. Central catheter tip is near the cavoatrial junction. In the visualized upper abdomen, there is diffuse calcification of the spleen consistent with a degree of autoinfarction, stable. 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 Bones overall are sclerotic consistent with the chronic anemia. There is a probable sebaceous cyst measuring 2.1 x 1.1 cm in the right anterior chest wall, stable. Review of the MIP images confirms the above findings. IMPRESSION: Small pulmonary embolus in a posterior segment right lower lobe pulmonary artery branch, not present recently and presumed new. No other pulmonary emboli identified. Stable cardiomegaly and anterior mediastinal adenopathy. Single prominent lymph node in the right hilum. Stable areas of patchy lower lobe atelectasis and mild patchy infiltrate in the left upper lobe. No new parenchymal lung opacity. 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 prior splenic autoinfarction. Critical Value/emergent results were called by telephone at the time of interpretation on 11/13/2014 at 12:32 pm to Dr. Joseph Berkshire , who verbally acknowledged these results.  Electronically Signed   By: Lowella Grip III M.D.   On: 11/13/2014 12:34   Ct Angio Chest Pe W/cm &/or Wo Cm  10/26/2014  CLINICAL DATA:  Sickle cell crisis.  Chest pain.  Low O2 sats. EXAM: CT ANGIOGRAPHY CHEST WITH CONTRAST TECHNIQUE: Multidetector CT imaging of the chest was performed using the standard protocol during bolus administration of intravenous contrast. Multiplanar CT image reconstructions and MIPs were obtained to evaluate the vascular anatomy. CONTRAST:  161mL OMNIPAQUE IOHEXOL 350 MG/ML SOLN COMPARISON:  09/10/2014.  Chest x-ray earlier today. FINDINGS: There is cardiomegaly. Aorta is normal caliber. Mildly enlarged anterior mediastinal/prevascular lymph nodes. Index node has a short axis  diameter of 11 mm, stable. No hilar or axillary adenopathy. Previously seen lower lobe pulmonary emboli not visualized on today's study. For no confluent airspace opacity right that linear areas of scarring in the lung bases. No effusions or confluent airspace opacities. Left chest wall Port-A-Cath remains in place, unchanged. Chest wall soft tissues otherwise unremarkable. Imaging into the upper abdomen shows no acute findings. 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 No acute bony abnormality or focal bone lesion. Review of the MIP images confirms the above findings. IMPRESSION: Stable cardiomegaly and anterior mediastinal adenopathy. Previously seen pulmonary emboli no longer visualized. Scarring in the lung bases. No acute findings. Electronically Signed   By: Rolm Baptise M.D.   On: 10/26/2014 09:54     [facility] Course: [name] was admitted to the day infusion center for sickle cell pain primarily to flanks bilaterally. Reviewed labs, WBC count is elevated at 21. Patient denies chest pains, cough, shortness of breath or heart palpitations. Reviewed chest x-ray from 11/15/2014, showed no active cardiopulmonary disease, just chronic changes to lung bases. Made a follow up appointment with primary provider about chronic conditions on 11/18/2014 at 11:30 am. Patient used a total of 17.5 mg with 28 demands and 26 deliveries. Patient states that current pain intensity is 4/10, which is decreased from 8/10. Patient reports that he can manage at home on current medication regimen. Reminded [name] to hydrate as well.   Physical Exam at Discharge:  BP 116/68 mmHg  Pulse 88  Temp(Src) 97.8 F (36.6 C) (Oral)  Resp 19  Ht 6' (1.829 m)  Wt 165 lb (74.844 kg)  BMI 22.37 kg/m2  SpO2 95%   General Appearance:    Alert, cooperative, no distress, appears stated age  Head:    Normocephalic, without obvious abnormality, atraumatic  Eyes:     PERRL, conjunctiva/corneas clear, EOM's intact, fundi    benign, both eyes       Ears:    Normal TM's and external ear canals, both ears  Nose:   Nares normal, septum midline, mucosa normal, no drainage   or sinus tenderness  Throat:   Lips, mucosa, and tongue normal; teeth and gums normal  Neck:   Supple, symmetrical, trachea midline, no adenopathy;       thyroid:  No enlargement/tenderness/nodules; no carotid   bruit or JVD  Back:     Symmetric, no curvature, ROM normal, no CVA tenderness  Lungs:     Clear to auscultation bilaterally, respirations unlabored  Chest wall:    No tenderness or deformity  Heart:    no murmur, rub   or gallop  Abdomen:     Soft, non-tender, bowel sounds active all four quadrants,    no masses, no organomegaly  Extremities:   Extremities normal, atraumatic,  no cyanosis or edema  Pulses:   2+ and symmetric all extremities  Skin:   Skin color, texture, turgor normal, no rashes or lesions  Lymph nodes:   Cervical, supraclavicular, and axillary nodes normal  Neurologic:   CNII-XII intact. Normal strength, sensation and reflexes      throughout    Disposition at Discharge: 01-Home or Self Care  Discharge Orders:   Condition at Discharge:   Stable  Time spent on Discharge:  20 minutes  Signed: Wandy Bossler M 11/17/2014, 4:14 PM

## 2014-11-18 ENCOUNTER — Ambulatory Visit (INDEPENDENT_AMBULATORY_CARE_PROVIDER_SITE_OTHER): Payer: Medicare Other | Admitting: Internal Medicine

## 2014-11-18 ENCOUNTER — Encounter: Payer: Self-pay | Admitting: Internal Medicine

## 2014-11-18 ENCOUNTER — Non-Acute Institutional Stay (HOSPITAL_COMMUNITY)
Admission: AD | Admit: 2014-11-18 | Discharge: 2014-11-18 | Disposition: A | Discharge status: Home / Self Care | Payer: Medicare Other | Source: Ambulatory Visit | Attending: Internal Medicine | Admitting: Internal Medicine

## 2014-11-18 VITALS — BP 120/79 | HR 91 | Temp 98.7°F | Resp 18 | Ht 72.0 in | Wt 163.0 lb

## 2014-11-18 DIAGNOSIS — D5700 Hb-SS disease with crisis, unspecified: Secondary | ICD-10-CM | POA: Insufficient documentation

## 2014-11-18 DIAGNOSIS — Z86711 Personal history of pulmonary embolism: Secondary | ICD-10-CM | POA: Diagnosis not present

## 2014-11-18 DIAGNOSIS — R109 Unspecified abdominal pain: Secondary | ICD-10-CM | POA: Diagnosis present

## 2014-11-18 DIAGNOSIS — Z87891 Personal history of nicotine dependence: Secondary | ICD-10-CM | POA: Diagnosis not present

## 2014-11-18 DIAGNOSIS — D57819 Other sickle-cell disorders with crisis, unspecified: Secondary | ICD-10-CM

## 2014-11-18 DIAGNOSIS — Z79899 Other long term (current) drug therapy: Secondary | ICD-10-CM | POA: Diagnosis not present

## 2014-11-18 DIAGNOSIS — Z96641 Presence of right artificial hip joint: Secondary | ICD-10-CM | POA: Insufficient documentation

## 2014-11-18 DIAGNOSIS — Z79891 Long term (current) use of opiate analgesic: Secondary | ICD-10-CM | POA: Diagnosis not present

## 2014-11-18 DIAGNOSIS — I10 Essential (primary) hypertension: Secondary | ICD-10-CM | POA: Diagnosis not present

## 2014-11-18 DIAGNOSIS — I272 Other secondary pulmonary hypertension: Secondary | ICD-10-CM | POA: Diagnosis not present

## 2014-11-18 DIAGNOSIS — Z7901 Long term (current) use of anticoagulants: Secondary | ICD-10-CM | POA: Insufficient documentation

## 2014-11-18 DIAGNOSIS — Z7982 Long term (current) use of aspirin: Secondary | ICD-10-CM | POA: Insufficient documentation

## 2014-11-18 DIAGNOSIS — G894 Chronic pain syndrome: Secondary | ICD-10-CM | POA: Diagnosis not present

## 2014-11-18 MED ORDER — SENNOSIDES-DOCUSATE SODIUM 8.6-50 MG PO TABS
1.0000 | ORAL_TABLET | Freq: Two times a day (BID) | ORAL | Status: DC
  Administered 2014-11-18: 1 via ORAL
  Filled 2014-11-18: qty 1

## 2014-11-18 MED ORDER — DIPHENHYDRAMINE HCL 12.5 MG/5ML PO ELIX: 12.5000 mg | ORAL_SOLUTION | Freq: Four times a day (QID) | ORAL | Status: DC | PRN

## 2014-11-18 MED ORDER — SODIUM CHLORIDE 0.9 % IV SOLN
12.5000 mg | Freq: Four times a day (QID) | INTRAVENOUS | Status: DC | PRN
  Administered 2014-11-18: 12.5 mg via INTRAVENOUS
  Filled 2014-11-18 (×3): qty 0.25

## 2014-11-18 MED ORDER — FOLIC ACID 1 MG PO TABS
1.0000 mg | ORAL_TABLET | Freq: Every day | ORAL | Status: DC
  Administered 2014-11-18: 1 mg via ORAL
  Filled 2014-11-18: qty 1

## 2014-11-18 MED ORDER — ONDANSETRON HCL 4 MG/2ML IJ SOLN: 4.0000 mg | Freq: Four times a day (QID) | INTRAMUSCULAR | Status: DC | PRN

## 2014-11-18 MED ORDER — MORPHINE SULFATE ER 30 MG PO TBCR
30.0000 mg | EXTENDED_RELEASE_TABLET | Freq: Once | ORAL | Status: AC
  Administered 2014-11-18: 30 mg via ORAL
  Filled 2014-11-18: qty 1

## 2014-11-18 MED ORDER — DEXTROSE-NACL 5-0.45 % IV SOLN
INTRAVENOUS | Status: DC
  Administered 2014-11-18: 13:00:00 via INTRAVENOUS

## 2014-11-18 MED ORDER — HEPARIN SOD (PORK) LOCK FLUSH 100 UNIT/ML IV SOLN
500.0000 [IU] | INTRAVENOUS | Status: AC | PRN
  Administered 2014-11-18: 500 [IU]
  Filled 2014-11-18: qty 5

## 2014-11-18 MED ORDER — MORPHINE SULFATE ER 30 MG PO TBCR: 30.0000 mg | EXTENDED_RELEASE_TABLET | Freq: Two times a day (BID) | ORAL | Status: DC

## 2014-11-18 MED ORDER — NALOXONE HCL 0.4 MG/ML IJ SOLN
0.4000 mg | INTRAMUSCULAR | Status: DC | PRN
Start: 2014-11-18 — End: 2014-11-18

## 2014-11-18 MED ORDER — SODIUM CHLORIDE 0.9 % IJ SOLN: 9.0000 mL | INTRAMUSCULAR | Status: DC | PRN

## 2014-11-18 MED ORDER — KETOROLAC TROMETHAMINE 30 MG/ML IJ SOLN
30.0000 mg | Freq: Once | INTRAMUSCULAR | Status: AC
  Administered 2014-11-18: 30 mg via INTRAVENOUS
  Filled 2014-11-18: qty 1

## 2014-11-18 MED ORDER — SODIUM CHLORIDE 0.9 % IJ SOLN
10.0000 mL | INTRAMUSCULAR | Status: AC | PRN
  Administered 2014-11-18: 10 mL

## 2014-11-18 MED ORDER — HYDROMORPHONE 1 MG/ML IV SOLN
INTRAVENOUS | Status: DC
  Administered 2014-11-18: 13:00:00 via INTRAVENOUS
  Administered 2014-11-18: 13.5 mg via INTRAVENOUS
  Filled 2014-11-18: qty 25

## 2014-11-18 MED ORDER — POLYETHYLENE GLYCOL 3350 17 G PO PACK: 17.0000 g | PACK | Freq: Every day | ORAL | Status: DC | PRN

## 2014-11-18 NOTE — Progress Notes (Signed)
Patient ID: [name], male   DOB: [birth year], 34 y.o.   MRN: [id number] Patient is a 34 years old African-American male with history of sickle cell disease with complications. Patient is here for follow-up, complaining of pain rated at about 8 out of 10. Patient would like to be treated at the day hospital.  Plan: Transfer to the day hospital for treatment.   [name], MD, [place], [place], [place], [name] and [place], [place]   11/18/2014, 3:20 PM

## 2014-11-18 NOTE — Progress Notes (Signed)
Patient here for F/U  Patient complains of bilateral pain in ribcage. Pain is scaled at a 7 and described as an aching pain. Pain is constant.  Patient has taken medication this morning patient has not eaten.

## 2014-11-18 NOTE — Addendum Note (Signed)
Addended by: [name] E on: 11/18/2014 04:08 PM   Modules accepted: Orders

## 2014-11-18 NOTE — Discharge Summary (Signed)
Physician Discharge Summary  [name]:[id number] DOB: Jan 19, 1981 DOA: 11/18/2014  PCP: [name], MD  Admit date: 11/18/2014  Discharge date: 11/18/2014  Time spent: 30 minutes  Discharge Diagnoses:  Active Problems:   Sickle-cell disease with pain [facility])   Discharge Condition: Stable  Diet recommendation: Regular  History of present illness:    Hospital Course:  [name] was admitted to the day hospital with sickle cell painful crisis. Patient was treated with weight based IV Dilaudid PCA, IV Toradol as well as IV fluids. [name] showed significant improvement symptomatically and pain went down from 8 to 5 out of 10 at the time of discharge. [name] will follow-up in the clinic as previously scheduled, continue with home medications as per prior to admission. Patient was encouraged to take his hydroxyurea as against current focus on pain control. Patient also will also been advised to follow-up with all the specialists as scheduled including cardiologist. He was again counseled on compliance with anticoagulation and oxygen use at home.  Discharge Exam: Filed Vitals:   11/18/14 1625  BP:   Pulse: 94  Temp:   Resp: 22    General appearance: alert, cooperative and no distress Eyes: conjunctivae/corneas clear. PERRL, EOM's intact. Fundi benign. Neck: no adenopathy, no carotid bruit, no JVD, supple, symmetrical, trachea midline and thyroid not enlarged, symmetric, no tenderness/mass/nodules Back: symmetric, no curvature. ROM normal. No CVA tenderness. Resp: clear to auscultation bilaterally Chest wall: no tenderness Cardio: regular rate and rhythm, S1, S2 normal, no murmur, click, rub or gallop GI: soft, non-tender; bowel sounds normal; no masses, no organomegaly Extremities: extremities normal, atraumatic, no cyanosis or edema Pulses: 2+ and symmetric Skin: Skin color, texture, turgor normal. No rashes or lesions Neurologic: Grossly  normal  Discharge Instructions We discussed the need for good hydration, monitoring of hydration status, avoidance of heat, cold, stress, and infection triggers. We discussed the need to be compliant with taking Hydrea. [name] was reminded of the need to seek medical attention of any symptoms of bleeding, anemia, or infection occurs.   Discharge Medication List as of 11/18/2014  4:19 PM    START taking these medications   Details  morphine (MS CONTIN) 30 MG 12 hr tablet Take 1 tablet (30 mg total) by mouth every 12 (twelve) hours., Starting 11/18/2014, Until Discontinued, Print      CONTINUE these medications which have NOT CHANGED   Details  aspirin 81 MG chewable tablet Chew 1 tablet (81 mg total) by mouth daily., Starting 07/23/2014, Until Discontinued, Normal    Cholecalciferol (VITAMIN D) 2000 UNITS tablet Take 1 tablet (2,000 Units total) by mouth daily., Starting 02/13/2014, Until Discontinued, Normal    diltiazem (CARDIZEM CD) 120 MG 24 hr capsule Take 1 capsule (120 mg total) by mouth daily., Starting 03/13/2014, Until Discontinued, Print    enoxaparin (LOVENOX) 150 MG/ML injection Inject 0.73 mLs (110 mg total) into the skin daily., Starting 09/26/2014, Until Discontinued, Normal    folic acid (FOLVITE) 1 MG tablet Take 1 tablet (1 mg total) by mouth every morning., Starting 10/08/2013, Until Discontinued, Normal    HYDROmorphone (DILAUDID) 4 MG tablet Take 1 tablet (4 mg total) by mouth every 4 (four) hours as needed for severe pain., Starting 11/06/2014, Until Discontinued, Print    hydroxyurea (HYDREA) 500 MG capsule Take 1 capsule (500 mg total) by mouth daily. May take with food to minimize GI side effects., Starting 11/06/2014, Until Discontinued, Normal    lisinopril (PRINIVIL,ZESTRIL) 10 MG tablet Take 1  tablet (10 mg total) by mouth daily., Starting 01/22/2014, Until Discontinued, Normal    metoprolol succinate (TOPROL-XL) 25 MG 24 hr tablet Take 25 mg by mouth daily.,  Until Discontinued, Historical Med    potassium chloride SA (K-DUR,KLOR-CON) 20 MEQ tablet Take 1 tablet (20 mEq total) by mouth every morning., Starting 06/09/2014, Until Discontinued, Normal    zolpidem (AMBIEN) 10 MG tablet Take 1 tablet (10 mg total) by mouth at bedtime as needed for sleep., Starting 10/23/2014, Until Discontinued, Print       No Known Allergies   Significant Diagnostic Studies: Dg Chest 2 View  11/15/2014  CLINICAL DATA:  Back pain.  Sickle cell anemia. EXAM: CHEST  2 VIEW COMPARISON:  11/13/2014 FINDINGS: Cardiomegaly. Left subclavian vein Port-A-Cath is stable. No pneumothorax. Chronic changes at the lung bases. IMPRESSION: No active cardiopulmonary disease. Electronically Signed   By: Marybelle Killings M.D.   On: 11/15/2014 08:52   Dg Chest 2 View  11/13/2014  CLINICAL DATA:  Sickle cell pain crisis with left lateral chest wall pain EXAM: CHEST  2 VIEW COMPARISON:  10/26/2014 FINDINGS: Chronic cardiomegaly. Negative aortic and hilar contours. Stable left subclavian porta catheter placement, tip at the upper right atrium. Chronic interstitial coarsening consistent with scar. There is no edema, consolidation, effusion, or pneumothorax. No osseous findings to explain acute pain. Chronic humeral head osteonecrosis. Cholecystectomy. IMPRESSION: Stable lung scarring and cardiomegaly. No evidence of acute cardiopulmonary disease. Electronically Signed   By: Monte Fantasia M.D.   On: 11/13/2014 08:15   Dg Chest 2 View  10/26/2014  CLINICAL DATA:  Chronic sickle cell pain EXAM: CHEST  2 VIEW COMPARISON:  04/12/2014 FINDINGS: Cardiomegaly.  No frank interstitial edema. Mild patchy right basilar/right lower lobe opacity, atelectasis versus pneumonia. No pleural effusion or pneumothorax. Left chest power port terminating at the cavoatrial junction. IMPRESSION: Mild patchy right lower lobe opacity, atelectasis versus pneumonia. Cardiomegaly.  No frank interstitial edema. Electronically  Signed   By: Julian Hy M.D.   On: 10/26/2014 07:40   Dg Lumbar Spine Complete  11/15/2014  CLINICAL DATA:  Sickle cell pain crisis EXAM: LUMBAR SPINE - COMPLETE 4+ VIEW COMPARISON:  None. FINDINGS: Five lumbar type vertebral bodies. Straightening of the lumbar spine. No evidence of fracture or dislocation. Vertebral body heights are maintained. Mild superior/ inferior endplate changes at L5 characteristic of sickle cell. Visualized bony pelvis appears intact. IMPRESSION: No fracture or dislocation is seen. Electronically Signed   By: Julian Hy M.D.   On: 11/15/2014 11:22   Dg Pelvis 1-2 Views  11/15/2014  CLINICAL DATA:  Sickle cell pain crisis EXAM: PELVIS - 1-2 VIEW COMPARISON:  CT right hip dated 10/27/2013 FINDINGS: Right hip arthroplasty in satisfactory position. No fracture or dislocation is seen. Left hip joint space is preserved. Visualized bony pelvis appears intact. IMPRESSION: Right hip arthroplasty satisfactory position. No fracture or dislocation is seen. Electronically Signed   By: Julian Hy M.D.   On: 11/15/2014 11:19   Ct Angio Chest Pe W/cm &/or Wo Cm  11/13/2014  CLINICAL DATA:  Chest pain.  Sickle cell disease EXAM: CT ANGIOGRAPHY CHEST WITH CONTRAST TECHNIQUE: Multidetector CT imaging of the chest was performed using the standard protocol during bolus administration of intravenous contrast. Multiplanar CT image reconstructions and MIPs were obtained to evaluate the vascular anatomy. CONTRAST:  159mL OMNIPAQUE IOHEXOL 350 MG/ML SOLN COMPARISON:  Chest CT October 26, 2014 and November 13, 2014 chest radiograph FINDINGS: There is a small pulmonary embolus in the  posterior segment right lower lobe pulmonary artery. No more central pulmonary emboli are identified. No evidence of right heart strain. There is no thoracic aortic aneurysm or dissection. The visualized great vessels appear normal. There is a small area of patchy infiltrate in the anterior segment of the  left upper lobe, stable. There is patchy atelectatic change in both bases, stable. No new opacity is appreciable. Cardiomegaly is stable.  The pericardium is not thickened. Visualized thyroid appears normal. Anterior mediastinal adenopathy remains, stable. The largest individual lymph node in the anterior mediastinum measures 2.3 x 1.5 cm, unchanged. There is a single prominent lymph node in the right hilum measuring 1.6 x 1.2 cm. There is no new adenopathy. Central catheter tip is near the cavoatrial junction. In the visualized upper abdomen, there is diffuse calcification of the spleen consistent with a degree of autoinfarction, stable. 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 Bones overall are sclerotic consistent with the chronic anemia. There is a probable sebaceous cyst measuring 2.1 x 1.1 cm in the right anterior chest wall, stable. Review of the MIP images confirms the above findings. IMPRESSION: Small pulmonary embolus in a posterior segment right lower lobe pulmonary artery branch, not present recently and presumed new. No other pulmonary emboli identified. Stable cardiomegaly and anterior mediastinal adenopathy. Single prominent lymph node in the right hilum. Stable areas of patchy lower lobe atelectasis and mild patchy infiltrate in the left upper lobe. No new parenchymal lung opacity. 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 prior splenic autoinfarction. Critical Value/emergent results were called by telephone at the time of interpretation on 11/13/2014 at 12:32 pm to Dr. Joseph Berkshire , who verbally acknowledged these results. Electronically Signed   By: Lowella Grip III M.D.   On: 11/13/2014 12:34   Ct Angio Chest Pe W/cm &/or Wo Cm  10/26/2014  CLINICAL DATA:  Sickle cell crisis.  Chest pain.  Low O2 sats. EXAM: CT ANGIOGRAPHY CHEST WITH CONTRAST TECHNIQUE: Multidetector CT imaging of the chest was  performed using the standard protocol during bolus administration of intravenous contrast. Multiplanar CT image reconstructions and MIPs were obtained to evaluate the vascular anatomy. CONTRAST:  122mL OMNIPAQUE IOHEXOL 350 MG/ML SOLN COMPARISON:  09/10/2014.  Chest x-ray earlier today. FINDINGS: There is cardiomegaly. Aorta is normal caliber. Mildly enlarged anterior mediastinal/prevascular lymph nodes. Index node has a short axis diameter of 11 mm, stable. No hilar or axillary adenopathy. Previously seen lower lobe pulmonary emboli not visualized on today's study. For no confluent airspace opacity right that linear areas of scarring in the lung bases. No effusions or confluent airspace opacities. Left chest wall Port-A-Cath remains in place, unchanged. Chest wall soft tissues otherwise unremarkable. Imaging into the upper abdomen shows no acute findings. 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 No acute bony abnormality or focal bone lesion. Review of the MIP images confirms the above findings. IMPRESSION: Stable cardiomegaly and anterior mediastinal adenopathy. Previously seen pulmonary emboli no longer visualized. Scarring in the lung bases. No acute findings. Electronically Signed   By: Rolm Baptise M.D.   On: 10/26/2014 09:54    Signed:  [name] MD, [place], [place], [place], CPE   11/18/2014, 5:39 PM

## 2014-11-18 NOTE — Progress Notes (Signed)
Pt admitted from office of [facility] to the day hospital for acute care. Pt was started on Dilaudid pca for sickle cell pain. Also IV fluids. Pt's pain started at 7/10 but down to 4 at discharge. O2 sat still drops to the 80's at times and Oxygen was place at 2 liters. Discharge instructions given to patient with also verifying that he has an appt with [facility] on Nov 2nd. Also prescription of MS contin given to patient. Pt voiced understanding at discharge.

## 2014-11-18 NOTE — H&P (Signed)
[facility] History and Physical  AMADEO COKE VPX:[id number] DOB: [birth year] DOA: 11/18/2014  PCP: [name], MD   Chief Complaint: No chief complaint on file.   HPI: [name] is a 34 y.o. male with history of sickle cell anemia, pulmonary embolism, acute chest syndrome, pulmonary hypertension and frequent ED utilization. He was initially here for routine follow-up at the clinic but complaining of 8 out of 10 flank pain and pain in the joints. He was seen and admitted to the day hospital all day yesterday but pain relief was not sustained. Patient has had multiple emergency room visits over the past week. Patient has had several consultations scheduled, including hematology and cardiology, but has being a "no show" in those appointments. He states that current pain intensity is 8/10 primarily to right flank, which is consistent with typical sickle cell pain. Patient currently denies headache, shortness of breath, chest pain, fever, heart palpitations, fatigue, abdominal pain, urinary problems, nausea, vomiting, or diarrhea.   Systemic Review: General: The patient denies anorexia, fever, weight loss Cardiac: Denies chest pain, syncope, palpitations, pedal edema  Respiratory: Denies cough, shortness of breath, wheezing GI: Denies severe indigestion/heartburn, abdominal pain, nausea, vomiting, diarrhea and constipation GU: Denies hematuria, incontinence, dysuria  Skin: Denies suspicious skin lesions Neurologic: Denies focal weakness or numbness, change in vision  Past Medical History  Diagnosis Date  . Sickle cell anemia (HCC)   . Blood transfusion   . Acute embolism and thrombosis of right internal jugular vein ([place])   . Hypokalemia   . Mood disorder ([place])   . History of pulmonary embolus (PE)   . Avascular necrosis ([place])   . Leukocytosis     Chronic  . Thrombocytosis (HCC)     Chronic  . Hypertension   . History of Clostridium difficile  infection   . Uses marijuana   . Chronic anticoagulation   . Functional asplenia   . Former smoker   . Second hand tobacco smoke exposure   . Alcohol consumption of one to four drinks per day   . Noncompliance with medication regimen   . Sickle-cell crisis with associated acute chest syndrome ([place]) 05/13/2013  . Acute chest syndrome ([place]) 06/18/2013  . Demand ischemia ([place]) 01/02/2014    Past Surgical History  Procedure Laterality Date  . Right hip replacement      08/2006  . Cholecystectomy      01/2008  . Porta cath placement    . Porta cath removal    . Umbilical hernia repair      01/2008  . Excision of left periauricular cyst      10/2009  . Excision of right ear lobe cyst with primary closur      11/2007  . Portacath placement  01/05/2012    Procedure: INSERTION PORT-A-CATH;  Surgeon: Odis Hollingshead, MD;  Location: [place];  Service: General;  Laterality: N/A;  ultrasound guiced port a cath insertion with fluoroscopy    No Known Allergies  Family History  Problem Relation Age of Onset  . Sickle cell trait Mother   . Depression Mother   . Diabetes Mother   . Sickle cell trait Father   . Sickle cell trait Brother       Prior to Admission medications   Medication Sig Start Date End Date Taking? Authorizing Provider  aspirin 81 MG chewable tablet Chew 1 tablet (81 mg total) by mouth daily. Patient not taking: Reported on 11/18/2014 07/23/14   Sharyn Lull  Linus Galas, MD  Cholecalciferol (VITAMIN D) 2000 UNITS tablet Take 1 tablet (2,000 Units total) by mouth daily. 02/13/14   [name], MD  diltiazem (CARDIZEM CD) 120 MG 24 hr capsule Take 1 capsule (120 mg total) by mouth daily. 03/13/14   Costin Karlyne Greenspan, MD  enoxaparin (LOVENOX) 150 MG/ML injection Inject 0.73 mLs (110 mg total) into the skin daily. 09/26/14   Micheline Chapman, NP  folic acid (FOLVITE) 1 MG tablet Take 1 tablet (1 mg total) by mouth every morning. 10/08/13   [name], MD  HYDROmorphone  (DILAUDID) 4 MG tablet Take 1 tablet (4 mg total) by mouth every 4 (four) hours as needed for severe pain. 11/06/14   Dorena Dew, FNP  hydroxyurea (HYDREA) 500 MG capsule Take 1 capsule (500 mg total) by mouth daily. May take with food to minimize GI side effects. 11/06/14   Dorena Dew, FNP  lisinopril (PRINIVIL,ZESTRIL) 10 MG tablet Take 1 tablet (10 mg total) by mouth daily. 01/22/14   [name], MD  metoprolol succinate (TOPROL-XL) 25 MG 24 hr tablet Take 25 mg by mouth daily.    Historical Provider, MD  morphine (MS CONTIN) 30 MG 12 hr tablet Take 1 tablet (30 mg total) by mouth every 12 (twelve) hours. 10/22/14   Dorena Dew, FNP  potassium chloride SA (K-DUR,KLOR-CON) 20 MEQ tablet Take 1 tablet (20 mEq total) by mouth every morning. 06/09/14   [name], MD  zolpidem (AMBIEN) 10 MG tablet Take 1 tablet (10 mg total) by mouth at bedtime as needed for sleep. 10/23/14   Tresa Garter, MD     Physical Exam: Filed Vitals:   11/18/14 1224  Pulse: 81  SpO2: 93%    General: Alert, awake, afebrile, anicteric, not in obvious distress HEENT: Normocephalic and Atraumatic, Mucous membranes pink                PERRLA; EOM intact; No scleral icterus,                 Nares: Patent, Oropharynx: Clear, Fair Dentition                 Neck: FROM, no cervical lymphadenopathy, thyromegaly, carotid bruit or JVD;  CHEST WALL: No tenderness  CHEST: Normal respiration, clear to auscultation bilaterally  HEART: Regular rate and rhythm; no murmurs rubs or gallops  BACK: No kyphosis or scoliosis; no CVA tenderness  ABDOMEN: Positive Bowel Sounds, soft, non-tender; no masses, no organomegaly Rectal Exam: deferred EXTREMITIES: No cyanosis, clubbing, or edema SKIN:  no rash or ulceration  CNS: Alert and Oriented x 4, Nonfocal exam, CN 2-12 intact  Labs on Admission:  Basic Metabolic Panel:  Recent Labs Lab 11/13/14 0856 11/15/14 0908 11/17/14 1245  NA 135 136 138   K 3.9 3.5 3.5  CL 107 107 108  CO2 24 22 23   GLUCOSE 110* 108* 123*  BUN 12 9 8   CREATININE 0.55* 0.54* 0.65  CALCIUM 8.9 9.0 9.0   Liver Function Tests:  Recent Labs Lab 11/13/14 0856 11/17/14 1245  AST 48* 39  ALT 36 32  ALKPHOS 90 83  BILITOT 5.4* 6.2*  PROT 8.0 8.1  ALBUMIN 4.1 4.2   No results for input(s): LIPASE, AMYLASE in the last 168 hours. No results for input(s): AMMONIA in the last 168 hours. CBC:  Recent Labs Lab 11/13/14 0856 11/15/14 0908 11/17/14 1245  WBC 16.6* 21.1* 21.1*  NEUTROABS 12.9* 16.5* 14.8*  HGB 7.5* 7.5* 8.1*  HCT 21.4* 22.1* 24.3*  MCV 92.2 92.5 93.8  PLT 482* 471* 484*   Cardiac Enzymes:  Recent Labs Lab 11/13/14 0856  TROPONINI 0.14*    BNP (last 3 results)  Recent Labs  08/29/14 0810 09/10/14 0612  BNP 1117.0* 549.5*    ProBNP (last 3 results)  Recent Labs  01/01/14 0909  PROBNP 5790.0*    CBG: No results for input(s): GLUCAP in the last 168 hours.   Assessment/Plan Active Problems:   Sickle-cell disease with pain ([place])   Admits to the Day Hospital  IVF D5 .45% Saline @ 125 mls/hour  Weight based Dilaudid PCA started within 30 minutes of admission  IV Toradol   Monitor vitals very closely, Re-evaluate pain scale every hour  Oxygen by [place]  Patient will be re-evaluated for pain in the context of function and relationship to baseline as care progresses.  If no significant relieve from pain (remains above 5/10) will transfer patient to inpatient services for further evaluation and management  Code Status: Full  Family Communication: None  DVT Prophylaxis: Ambulate as tolerated   Time spent: 26 Minutes  Ayven Glasco, MD, MHA, FACP, FAAP, CPE  If 7PM-7AM, please contact night-coverage [email or web address] 11/18/2014, 12:52 PM

## 2014-11-18 NOTE — Patient Instructions (Signed)
Sickle Cell Anemia, Adult Sickle cell anemia is a condition in which red blood cells have an abnormal "sickle" shape. This abnormal shape shortens the cells' life span, which results in a lower than normal concentration of red blood cells in the blood. The sickle shape also causes the cells to clump together and block free blood flow through the blood vessels. As a result, the tissues and organs of the body do not receive enough oxygen. Sickle cell anemia causes organ damage and pain and increases the risk of infection. CAUSES  Sickle cell anemia is a genetic disorder. Those who receive two copies of the gene have the condition, and those who receive one copy have the trait. RISK FACTORS The sickle cell gene is most common in people whose families originated in [place]. Other areas of the globe where sickle cell trait occurs include the Mediterranean, [place] and [place], the [place], and the [place].  SIGNS AND SYMPTOMS  Pain, especially in the extremities, back, chest, or abdomen (common). The pain may start suddenly or may develop following an illness, especially if there is dehydration. Pain can also occur due to overexertion or exposure to extreme temperature changes.  Frequent severe bacterial infections, especially certain types of pneumonia and meningitis.  Pain and swelling in the hands and feet.  Decreased activity.   Loss of appetite.   Change in behavior.  Headaches.  Seizures.  Shortness of breath or difficulty breathing.  Vision changes.  Skin ulcers. Those with the trait may not have symptoms or they may have mild symptoms.  DIAGNOSIS  Sickle cell anemia is diagnosed with blood tests that demonstrate the genetic trait. It is often diagnosed during the newborn period, due to mandatory testing nationwide. A variety of blood tests, X-rays, CT scans, MRI scans, ultrasounds, and lung function tests may also be done to monitor the condition. TREATMENT  Sickle  cell anemia may be treated with:  Medicines. You may be given pain medicines, antibiotic medicines (to treat and prevent infections) or medicines to increase the production of certain types of hemoglobin.  Fluids.  Oxygen.  Blood transfusions. HOME CARE INSTRUCTIONS   Drink enough fluid to keep your urine clear or pale yellow. Increase your fluid intake in hot weather and during exercise.  Do not smoke. Smoking lowers oxygen levels in the blood.   Only take over-the-counter or prescription medicines for pain, fever, or discomfort as directed by your health care provider.  Take antibiotics as directed by your health care provider. Make sure you finish them it even if you start to feel better.   Take supplements as directed by your health care provider.   Consider wearing a medical alert bracelet. This tells anyone caring for you in an emergency of your condition.   When traveling, keep your medical information, health care provider's names, and the medicines you take with you at all times.   If you develop a fever, do not take medicines to reduce the fever right away. This could cover up a problem that is developing. Notify your health care provider.  Keep all follow-up appointments with your health care provider. Sickle cell anemia requires regular medical care. SEEK MEDICAL CARE IF: You have a fever. SEEK IMMEDIATE MEDICAL CARE IF:   You feel dizzy or faint.   You have new abdominal pain, especially on the left side near the stomach area.   You develop a persistent, often uncomfortable and painful penile erection (priapism). If this is not treated immediately it   will lead to impotence.   You have numbness your arms or legs or you have a hard time moving them.   You have a hard time with speech.   You have a fever or persistent symptoms for more than 2-3 days.   You have a fever and your symptoms suddenly get worse.   You have signs or symptoms of infection.  These include:   Chills.   Abnormal tiredness (lethargy).   Irritability.   Poor eating.   Vomiting.   You develop pain that is not helped with medicine.   You develop shortness of breath.  You have pain in your chest.   You are coughing up pus-like or bloody sputum.   You develop a stiff neck.  Your feet or hands swell or have pain.  Your abdomen appears bloated.  You develop joint pain. MAKE SURE YOU:  Understand these instructions.   This information is not intended to replace advice given to you by your health care provider. Make sure you discuss any questions you have with your health care provider.   Document Released: 04/27/2005 Document Revised: 02/07/2014 Document Reviewed: 08/29/2012 Elsevier Interactive Patient Education 2016 [employer]  

## 2014-11-20 ENCOUNTER — Telehealth (HOSPITAL_COMMUNITY): Payer: Self-pay

## 2014-11-20 ENCOUNTER — Encounter (HOSPITAL_COMMUNITY): Payer: Self-pay

## 2014-11-20 ENCOUNTER — Non-Acute Institutional Stay (HOSPITAL_COMMUNITY)
Admission: AD | Admit: 2014-11-20 | Discharge: 2014-11-20 | Disposition: A | Discharge status: Home / Self Care | Payer: Medicare Other | Attending: Internal Medicine | Admitting: Internal Medicine

## 2014-11-20 DIAGNOSIS — J9621 Acute and chronic respiratory failure with hypoxia: Secondary | ICD-10-CM | POA: Diagnosis present

## 2014-11-20 DIAGNOSIS — I482 Chronic atrial fibrillation: Secondary | ICD-10-CM | POA: Diagnosis present

## 2014-11-20 DIAGNOSIS — D5700 Hb-SS disease with crisis, unspecified: Principal | ICD-10-CM | POA: Diagnosis present

## 2014-11-20 DIAGNOSIS — Z7722 Contact with and (suspected) exposure to environmental tobacco smoke (acute) (chronic): Secondary | ICD-10-CM | POA: Diagnosis present

## 2014-11-20 DIAGNOSIS — I2699 Other pulmonary embolism without acute cor pulmonale: Secondary | ICD-10-CM | POA: Diagnosis not present

## 2014-11-20 DIAGNOSIS — I10 Essential (primary) hypertension: Secondary | ICD-10-CM | POA: Insufficient documentation

## 2014-11-20 DIAGNOSIS — Z9981 Dependence on supplemental oxygen: Secondary | ICD-10-CM

## 2014-11-20 DIAGNOSIS — Z7901 Long term (current) use of anticoagulants: Secondary | ICD-10-CM | POA: Insufficient documentation

## 2014-11-20 DIAGNOSIS — Z9119 Patient's noncompliance with other medical treatment and regimen: Secondary | ICD-10-CM

## 2014-11-20 DIAGNOSIS — D57819 Other sickle-cell disorders with crisis, unspecified: Secondary | ICD-10-CM | POA: Diagnosis not present

## 2014-11-20 DIAGNOSIS — I2781 Cor pulmonale (chronic): Secondary | ICD-10-CM | POA: Diagnosis present

## 2014-11-20 DIAGNOSIS — D571 Sickle-cell disease without crisis: Secondary | ICD-10-CM | POA: Diagnosis not present

## 2014-11-20 DIAGNOSIS — F1290 Cannabis use, unspecified, uncomplicated: Secondary | ICD-10-CM | POA: Diagnosis present

## 2014-11-20 DIAGNOSIS — Z7982 Long term (current) use of aspirin: Secondary | ICD-10-CM | POA: Insufficient documentation

## 2014-11-20 DIAGNOSIS — Q8901 Asplenia (congenital): Secondary | ICD-10-CM

## 2014-11-20 DIAGNOSIS — Z87891 Personal history of nicotine dependence: Secondary | ICD-10-CM

## 2014-11-20 DIAGNOSIS — D638 Anemia in other chronic diseases classified elsewhere: Secondary | ICD-10-CM | POA: Diagnosis present

## 2014-11-20 DIAGNOSIS — D72829 Elevated white blood cell count, unspecified: Secondary | ICD-10-CM | POA: Diagnosis present

## 2014-11-20 DIAGNOSIS — Z833 Family history of diabetes mellitus: Secondary | ICD-10-CM

## 2014-11-20 DIAGNOSIS — Z96641 Presence of right artificial hip joint: Secondary | ICD-10-CM | POA: Diagnosis present

## 2014-11-20 DIAGNOSIS — Z86711 Personal history of pulmonary embolism: Secondary | ICD-10-CM | POA: Insufficient documentation

## 2014-11-20 DIAGNOSIS — J9601 Acute respiratory failure with hypoxia: Secondary | ICD-10-CM | POA: Diagnosis not present

## 2014-11-20 DIAGNOSIS — Z86718 Personal history of other venous thrombosis and embolism: Secondary | ICD-10-CM | POA: Insufficient documentation

## 2014-11-20 DIAGNOSIS — G894 Chronic pain syndrome: Secondary | ICD-10-CM | POA: Diagnosis present

## 2014-11-20 DIAGNOSIS — Z79899 Other long term (current) drug therapy: Secondary | ICD-10-CM | POA: Insufficient documentation

## 2014-11-20 DIAGNOSIS — I272 Other secondary pulmonary hypertension: Secondary | ICD-10-CM | POA: Diagnosis present

## 2014-11-20 MED ORDER — ONDANSETRON HCL 4 MG/2ML IJ SOLN: 4.0000 mg | Freq: Four times a day (QID) | INTRAMUSCULAR | Status: DC | PRN

## 2014-11-20 MED ORDER — NALOXONE HCL 0.4 MG/ML IJ SOLN: 0.4000 mg | INTRAMUSCULAR | Status: DC | PRN

## 2014-11-20 MED ORDER — HYDROMORPHONE 1 MG/ML IV SOLN
INTRAVENOUS | Status: DC
  Administered 2014-11-20: 10:00:00 via INTRAVENOUS
  Administered 2014-11-20: 12.6 mg via INTRAVENOUS
  Filled 2014-11-20: qty 25

## 2014-11-20 MED ORDER — KETOROLAC TROMETHAMINE 30 MG/ML IJ SOLN
30.0000 mg | Freq: Once | INTRAMUSCULAR | Status: AC
  Administered 2014-11-20: 30 mg via INTRAVENOUS
  Filled 2014-11-20: qty 1

## 2014-11-20 MED ORDER — DIPHENHYDRAMINE HCL 12.5 MG/5ML PO ELIX
12.5000 mg | ORAL_SOLUTION | Freq: Four times a day (QID) | ORAL | Status: DC | PRN
  Administered 2014-11-20: 12.5 mg via ORAL
  Filled 2014-11-20: qty 5

## 2014-11-20 MED ORDER — SODIUM CHLORIDE 0.9 % IV SOLN
12.5000 mg | Freq: Four times a day (QID) | INTRAVENOUS | Status: DC | PRN
  Filled 2014-11-20 (×2): qty 0.25

## 2014-11-20 MED ORDER — HEPARIN SOD (PORK) LOCK FLUSH 100 UNIT/ML IV SOLN
500.0000 [IU] | INTRAVENOUS | Status: DC | PRN
  Administered 2014-11-20: 500 [IU]
  Filled 2014-11-20: qty 5

## 2014-11-20 MED ORDER — SODIUM CHLORIDE 0.9 % IJ SOLN
9.0000 mL | INTRAMUSCULAR | Status: DC | PRN
  Administered 2014-11-20: 9 mL via INTRAVENOUS

## 2014-11-20 MED ORDER — HYDROMORPHONE HCL 4 MG PO TABS
4.0000 mg | ORAL_TABLET | Freq: Once | ORAL | Status: AC
  Administered 2014-11-20: 4 mg via ORAL
  Filled 2014-11-20: qty 1

## 2014-11-20 MED ORDER — DEXTROSE-NACL 5-0.45 % IV SOLN
INTRAVENOUS | Status: DC
  Administered 2014-11-20: 10:00:00 via INTRAVENOUS

## 2014-11-20 MED ORDER — SODIUM CHLORIDE 0.9 % IJ SOLN: 10.0000 mL | INTRAMUSCULAR | Status: DC | PRN

## 2014-11-20 NOTE — Progress Notes (Signed)
Patient seen in the day hospital today. Pain 3/10 at discharge. Patient alert, oriented, and ambulatory. Discharge instructions given. Patient has no further needs at this time.

## 2014-11-20 NOTE — Discharge Summary (Signed)
[facility] Discharge Summary   Patient ID: [name] MRN: [id number] DOB/AGE: 1980-12-13 34 y.o.  Admit date: 11/20/2014 Discharge date: 11/20/2014  Primary Care Physician:  [name], MD  Admission Diagnoses:  Active Problems:   Sickle-cell disease with pain [facility])    Discharge Medications:    Medication List    ASK your doctor about these medications        aspirin 81 MG chewable tablet  Chew 1 tablet (81 mg total) by mouth daily.     diltiazem 120 MG 24 hr capsule  Commonly known as:  CARDIZEM CD  Take 1 capsule (120 mg total) by mouth daily.     enoxaparin 150 MG/ML injection  Commonly known as:  LOVENOX  Inject 0.73 mLs (110 mg total) into the skin daily.     folic acid 1 MG tablet  Commonly known as:  FOLVITE  Take 1 tablet (1 mg total) by mouth every morning.     HYDROmorphone 4 MG tablet  Commonly known as:  DILAUDID  Take 1 tablet (4 mg total) by mouth every 4 (four) hours as needed for severe pain.     hydroxyurea 500 MG capsule  Commonly known as:  HYDREA  Take 1 capsule (500 mg total) by mouth daily. May take with food to minimize GI side effects.     lisinopril 10 MG tablet  Commonly known as:  PRINIVIL,ZESTRIL  Take 1 tablet (10 mg total) by mouth daily.     metoprolol succinate 25 MG 24 hr tablet  Commonly known as:  TOPROL-XL  Take 25 mg by mouth daily.     morphine 30 MG 12 hr tablet  Commonly known as:  MS CONTIN  Take 1 tablet (30 mg total) by mouth every 12 (twelve) hours.     potassium chloride SA 20 MEQ tablet  Commonly known as:  K-DUR,KLOR-CON  Take 1 tablet (20 mEq total) by mouth every morning.     Vitamin D 2000 UNITS tablet  Take 1 tablet (2,000 Units total) by mouth daily.     zolpidem 10 MG tablet  Commonly known as:  AMBIEN  Take 1 tablet (10 mg total) by mouth at bedtime as needed for sleep.         Consults:  None  Significant Diagnostic Studies:  Dg Chest 2  View  11/15/2014  CLINICAL DATA:  Back pain.  Sickle cell anemia. EXAM: CHEST  2 VIEW COMPARISON:  11/13/2014 FINDINGS: Cardiomegaly. Left subclavian vein Port-A-Cath is stable. No pneumothorax. Chronic changes at the lung bases. IMPRESSION: No active cardiopulmonary disease. Electronically Signed   By: Marybelle Killings M.D.   On: 11/15/2014 08:52   Dg Chest 2 View  11/13/2014  CLINICAL DATA:  Sickle cell pain crisis with left lateral chest wall pain EXAM: CHEST  2 VIEW COMPARISON:  10/26/2014 FINDINGS: Chronic cardiomegaly. Negative aortic and hilar contours. Stable left subclavian porta catheter placement, tip at the upper right atrium. Chronic interstitial coarsening consistent with scar. There is no edema, consolidation, effusion, or pneumothorax. No osseous findings to explain acute pain. Chronic humeral head osteonecrosis. Cholecystectomy. IMPRESSION: Stable lung scarring and cardiomegaly. No evidence of acute cardiopulmonary disease. Electronically Signed   By: Monte Fantasia M.D.   On: 11/13/2014 08:15   Dg Chest 2 View  10/26/2014  CLINICAL DATA:  Chronic sickle cell pain EXAM: CHEST  2 VIEW COMPARISON:  04/12/2014 FINDINGS: Cardiomegaly.  No frank interstitial edema. Mild patchy right basilar/right lower lobe  opacity, atelectasis versus pneumonia. No pleural effusion or pneumothorax. Left chest power port terminating at the cavoatrial junction. IMPRESSION: Mild patchy right lower lobe opacity, atelectasis versus pneumonia. Cardiomegaly.  No frank interstitial edema. Electronically Signed   By: Julian Hy M.D.   On: 10/26/2014 07:40   Dg Lumbar Spine Complete  11/15/2014  CLINICAL DATA:  Sickle cell pain crisis EXAM: LUMBAR SPINE - COMPLETE 4+ VIEW COMPARISON:  None. FINDINGS: Five lumbar type vertebral bodies. Straightening of the lumbar spine. No evidence of fracture or dislocation. Vertebral body heights are maintained. Mild superior/ inferior endplate changes at L5 characteristic of  sickle cell. Visualized bony pelvis appears intact. IMPRESSION: No fracture or dislocation is seen. Electronically Signed   By: Julian Hy M.D.   On: 11/15/2014 11:22   Dg Pelvis 1-2 Views  11/15/2014  CLINICAL DATA:  Sickle cell pain crisis EXAM: PELVIS - 1-2 VIEW COMPARISON:  CT right hip dated 10/27/2013 FINDINGS: Right hip arthroplasty in satisfactory position. No fracture or dislocation is seen. Left hip joint space is preserved. Visualized bony pelvis appears intact. IMPRESSION: Right hip arthroplasty satisfactory position. No fracture or dislocation is seen. Electronically Signed   By: Julian Hy M.D.   On: 11/15/2014 11:19   Ct Angio Chest Pe W/cm &/or Wo Cm  11/13/2014  CLINICAL DATA:  Chest pain.  Sickle cell disease EXAM: CT ANGIOGRAPHY CHEST WITH CONTRAST TECHNIQUE: Multidetector CT imaging of the chest was performed using the standard protocol during bolus administration of intravenous contrast. Multiplanar CT image reconstructions and MIPs were obtained to evaluate the vascular anatomy. CONTRAST:  189mL OMNIPAQUE IOHEXOL 350 MG/ML SOLN COMPARISON:  Chest CT October 26, 2014 and November 13, 2014 chest radiograph FINDINGS: There is a small pulmonary embolus in the posterior segment right lower lobe pulmonary artery. No more central pulmonary emboli are identified. No evidence of right heart strain. There is no thoracic aortic aneurysm or dissection. The visualized great vessels appear normal. There is a small area of patchy infiltrate in the anterior segment of the left upper lobe, stable. There is patchy atelectatic change in both bases, stable. No new opacity is appreciable. Cardiomegaly is stable.  The pericardium is not thickened. Visualized thyroid appears normal. Anterior mediastinal adenopathy remains, stable. The largest individual lymph node in the anterior mediastinum measures 2.3 x 1.5 cm, unchanged. There is a single prominent lymph node in the right hilum measuring  1.6 x 1.2 cm. There is no new adenopathy. Central catheter tip is near the cavoatrial junction. In the visualized upper abdomen, there is diffuse calcification of the spleen consistent with a degree of autoinfarction, stable. 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 Bones overall are sclerotic consistent with the chronic anemia. There is a probable sebaceous cyst measuring 2.1 x 1.1 cm in the right anterior chest wall, stable. Review of the MIP images confirms the above findings. IMPRESSION: Small pulmonary embolus in a posterior segment right lower lobe pulmonary artery branch, not present recently and presumed new. No other pulmonary emboli identified. Stable cardiomegaly and anterior mediastinal adenopathy. Single prominent lymph node in the right hilum. Stable areas of patchy lower lobe atelectasis and mild patchy infiltrate in the left upper lobe. No new parenchymal lung opacity. 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 prior splenic autoinfarction. Critical Value/emergent results were called by telephone at the time of interpretation on 11/13/2014 at 12:32 pm to Dr. Joseph Berkshire , who verbally acknowledged these results.  Electronically Signed   By: Lowella Grip III M.D.   On: 11/13/2014 12:34   Ct Angio Chest Pe W/cm &/or Wo Cm  10/26/2014  CLINICAL DATA:  Sickle cell crisis.  Chest pain.  Low O2 sats. EXAM: CT ANGIOGRAPHY CHEST WITH CONTRAST TECHNIQUE: Multidetector CT imaging of the chest was performed using the standard protocol during bolus administration of intravenous contrast. Multiplanar CT image reconstructions and MIPs were obtained to evaluate the vascular anatomy. CONTRAST:  136mL OMNIPAQUE IOHEXOL 350 MG/ML SOLN COMPARISON:  09/10/2014.  Chest x-ray earlier today. FINDINGS: There is cardiomegaly. Aorta is normal caliber. Mildly enlarged anterior mediastinal/prevascular lymph nodes. Index node has a  short axis diameter of 11 mm, stable. No hilar or axillary adenopathy. Previously seen lower lobe pulmonary emboli not visualized on today's study. For no confluent airspace opacity right that linear areas of scarring in the lung bases. No effusions or confluent airspace opacities. Left chest wall Port-A-Cath remains in place, unchanged. Chest wall soft tissues otherwise unremarkable. Imaging into the upper abdomen shows no acute findings. 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 No acute bony abnormality or focal bone lesion. Review of the MIP images confirms the above findings. IMPRESSION: Stable cardiomegaly and anterior mediastinal adenopathy. Previously seen pulmonary emboli no longer visualized. Scarring in the lung bases. No acute findings. Electronically Signed   By: Rolm Baptise M.D.   On: 10/26/2014 09:54     [facility] Course: Mr. Anurag Scarfo was admitted to the day infusion center for sickle cell pain primarily to flanks bilaterally. Reviewed labs, WBC count is elevated at 21. Patient denies chest pains, cough, shortness of breath or heart palpitations. Reviewed chest x-ray from 11/15/2014, showed no active cardiopulmonary disease, just chronic changes to lung bases.   Patient used a total of 12.6 mg with 20 demands and  18 deliveries. Patient states that current pain intensity is 3/10, which is decreased from 8/10. Patient reports that he can manage at home on current medication regimen. Reminded Mr. Capri to hydrate and follow a balanced diet. Continue medications as previously prescribed. Patient alert, oriented, and ambulatory.   Physical Exam at Discharge:  BP 126/77 mmHg  Pulse 83  Temp(Src) 98.5 F (36.9 C) (Oral)  Resp 20  SpO2 93%   General Appearance:    Alert, cooperative, no distress, appears stated age  Head:    Normocephalic, without obvious abnormality, atraumatic  Eyes:    PERRL, conjunctiva/corneas clear, EOM's  intact, fundi    benign, both eyes. Icterus present       Ears:    Normal TM's and external ear canals, both ears  Nose:   Nares normal, septum midline, mucosa normal, no drainage   or sinus tenderness  Throat:   Lips, mucosa, and tongue normal; teeth and gums normal  Neck:   Supple, symmetrical, trachea midline, no adenopathy;       thyroid:  No enlargement/tenderness/nodules; no carotid   bruit or JVD  Back:     Symmetric, no curvature, ROM normal, no CVA tenderness  Lungs:     Clear to auscultation bilaterally, respirations unlabored  Chest wall:    No tenderness or deformity  Heart:    no murmur, rub   or gallop  Abdomen:     Soft, non-tender, bowel sounds active all four quadrants,    no masses, no organomegaly  Extremities:   Extremities normal, atraumatic, no cyanosis or edema  Pulses:   2+ and symmetric all extremities  Skin:   Skin color, texture, turgor normal, no rashes or lesions  Lymph nodes:   Cervical, supraclavicular, and axillary nodes normal  Neurologic:   CNII-XII intact. Normal strength, sensation and reflexes      throughout    Disposition at Discharge: 01-Home or Self Care  Discharge Orders:   Condition at Discharge:   Stable  Time spent on Discharge:  15 minutes  Signed: Demetrice Amstutz M 11/20/2014, 2:04 PM

## 2014-11-20 NOTE — Telephone Encounter (Signed)
After speaking with provider, returned patient phone call. Told he could come to the day hospital for treatment. Patient states he will be here in approximately 15 minutes.

## 2014-11-20 NOTE — Telephone Encounter (Addendum)
Patient called complaining of back pain. Last pain medication 4 mg dilaudid was taken at 0600. Patient denies fever, chest pain, SOB, nausea/ vomiting or diarrhea. Patient would like to come to the day hospital for treatment.

## 2014-11-20 NOTE — H&P (Signed)
[facility] History and Physical   Date: 11/20/2014  Patient name: [name] Medical record number: [id number] Date of birth: 28-Mar-1980 Age: 34 y.o. Gender: male PCP: [name], MD  Attending physician: Tresa Garter, MD  Chief Complaint: Bilateral flank pain  History of Present Illness: [name], a 34 year old male with a history of sickle cell anemia presents for bilateral flank pain. He states that pain has been present for the past week. Patient has had multiple emergency room and day hospital visits over the past week. Patient had a follow-up scheduled for primary care on Tuesday, 11/17/2014. Patient has had several consultations scheduled, including hematology and cardiology, but has no showed appointments. He states that current pain intensity is 8/10 primarily to right flank, which is consistent with typical sickle cell pain. He reports that he last had pain medication around 6 am with minimal relief. Patient currently denies headache, shortness of breath, chest pain, fever, heart palpitations, fatigue, abdominal pain, urinary problems, nausea, vomiting, or diarrhea.   Meds: Prescriptions prior to admission  Medication Sig Dispense Refill Last Dose  . aspirin 81 MG chewable tablet Chew 1 tablet (81 mg total) by mouth daily. (Patient not taking: Reported on 11/18/2014) 30 tablet 11 Not Taking  . Cholecalciferol (VITAMIN D) 2000 UNITS tablet Take 1 tablet (2,000 Units total) by mouth daily. 90 tablet 3 Taking  . diltiazem (CARDIZEM CD) 120 MG 24 hr capsule Take 1 capsule (120 mg total) by mouth daily. 30 capsule 1 Taking  . enoxaparin (LOVENOX) 150 MG/ML injection Inject 0.73 mLs (110 mg total) into the skin daily. 22 Syringe 5 Taking  . folic acid (FOLVITE) 1 MG tablet Take 1 tablet (1 mg total) by mouth every morning. 30 tablet 11 Taking  . HYDROmorphone (DILAUDID) 4 MG tablet Take 1 tablet (4 mg total) by mouth every 4 (four) hours  as needed for severe pain. 90 tablet 0 Taking  . hydroxyurea (HYDREA) 500 MG capsule Take 1 capsule (500 mg total) by mouth daily. May take with food to minimize GI side effects. 30 capsule 0 Taking  . lisinopril (PRINIVIL,ZESTRIL) 10 MG tablet Take 1 tablet (10 mg total) by mouth daily. 30 tablet 1 Taking  . metoprolol succinate (TOPROL-XL) 25 MG 24 hr tablet Take 25 mg by mouth daily.   Taking  . morphine (MS CONTIN) 30 MG 12 hr tablet Take 1 tablet (30 mg total) by mouth every 12 (twelve) hours. 60 tablet 0   . potassium chloride SA (K-DUR,KLOR-CON) 20 MEQ tablet Take 1 tablet (20 mEq total) by mouth every morning. 30 tablet 3 Taking  . zolpidem (AMBIEN) 10 MG tablet Take 1 tablet (10 mg total) by mouth at bedtime as needed for sleep. 30 tablet 0 Taking    Allergies: Review of patient's allergies indicates no known allergies. Past Medical History  Diagnosis Date  . Sickle cell anemia (HCC)   . Blood transfusion   . Acute embolism and thrombosis of right internal jugular vein ([place])   . Hypokalemia   . Mood disorder ([place])   . History of pulmonary embolus (PE)   . Avascular necrosis ([place])   . Leukocytosis     Chronic  . Thrombocytosis (HCC)     Chronic  . Hypertension   . History of Clostridium difficile infection   . Uses marijuana   . Chronic anticoagulation   . Functional asplenia   . Former smoker   . Second hand tobacco  smoke exposure   . Alcohol consumption of one to four drinks per day   . Noncompliance with medication regimen   . Sickle-cell crisis with associated acute chest syndrome ([place]) 05/13/2013  . Acute chest syndrome ([place]) 06/18/2013  . Demand ischemia ([place]) 01/02/2014   Past Surgical History  Procedure Laterality Date  . Right hip replacement      08/2006  . Cholecystectomy      01/2008  . Porta cath placement    . Porta cath removal    . Umbilical hernia repair      01/2008  . Excision of left periauricular cyst      10/2009  . Excision of right ear lobe  cyst with primary closur      11/2007  . Portacath placement  01/05/2012    Procedure: INSERTION PORT-A-CATH;  Surgeon: Odis Hollingshead, MD;  Location: [place];  Service: General;  Laterality: N/A;  ultrasound guiced port a cath insertion with fluoroscopy   Family History  Problem Relation Age of Onset  . Sickle cell trait Mother   . Depression Mother   . Diabetes Mother   . Sickle cell trait Father   . Sickle cell trait Brother    Social History   Social History  . Marital Status: Single    Spouse Name: N/A  . Number of Children: 0  . Years of Education: 13   Occupational History  . 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 Smoking status: Former Smoker -- 13 years  . Smokeless tobacco: Never Used  . Alcohol Use: No  . Drug Use: 2.00 per week    Special: Marijuana  . Sexual Activity:    Partners: Female    Museum/gallery curator: None     Comment: month ago   Other Topics Concern  . Not on file   Social History Narrative   Lives in an apartment.  Single.  Lives alone but has a girlfriend that helps care for him.  Does not use any assist devices.        [name]:  [phone] Mom, emergency contact    Review of Systems: Constitutional: negative for fatigue and fevers Eyes: positive for icterus Ears, nose, mouth, throat, and face: negative Respiratory: negative for cough, dyspnea on exertion and wheezing Cardiovascular: negative for chest pain, chest pressure/discomfort, dyspnea, fatigue, lower extremity edema, near-syncope, palpitations and tachypnea Gastrointestinal: negative for abdominal pain and constipation Genitourinary:negative Integument/breast: negative Hematologic/lymphatic: negative Musculoskeletal:positive for myalgias Neurological: negative Behavioral/Psych: negative Endocrine: negative Allergic/Immunologic: negative  Physical Exam: There were no vitals taken for this  visit. There were no vitals taken for this visit.  General Appearance:    Alert, cooperative, mild distress, appears stated age  Head:    Normocephalic, without obvious abnormality, atraumatic  Eyes:    PERRL, icterus, EOM's intact, fundi    benign, both eyes       Ears:    Normal TM's and external ear canals, both ears  Nose:   Nares normal, septum midline, mucosa normal, no drainage    or sinus tenderness  Throat:   Lips, mucosa, and tongue normal; teeth and gums normal  Neck:   Supple, symmetrical, trachea midline, no adenopathy;       thyroid:  No enlargement/tenderness/nodules; no carotid   bruit or JVD  Back:     Symmetric, no curvature, ROM normal, no CVA tenderness  Lungs:  Clear to auscultation bilaterally, respirations unlabored  Chest wall:    No tenderness to chest wall or deformity  Heart:     S1 and S2 normal, no murmur, rub   or gallop  Abdomen:     Soft, non-tender, bowel sounds active all four quadrants,    no masses, no organomegaly  Extremities:   Extremities normal, atraumatic, no cyanosis or edema  Pulses:   2+ and symmetric all extremities  Skin:   Skin color, texture, turgor normal, no rashes or lesions  Lymph nodes:   Cervical, supraclavicular, and axillary nodes normal  Neurologic:   CNII-XII intact. Normal strength, sensation and reflexes      throughout    Lab results: No results found. However, due to the size of the patient record, not all encounters were searched. Please check Results Review for a complete set of results.  Imaging results:  No results found.   Assessment & Plan:  Patient will be admitted to the day infusion center for extended observation  Start IV D5.45 for cellular rehydration at 125/hr  Start Toradol 30 mg IV every 6 hours for inflammation.  Start Dilaudid PCA High Concentration per weight based protocol.   Patient will be re-evaluated for pain intensity in the context of function and relationship to baseline as care  progresses.  If no significant pain relief, will transfer patient to inpatient services for a higher level of care.   WBC from 11/17/2014 is 21.1, will repeat CBCw/differential, LDH, urinalysis, and CMP  Reviewed images from 11/15/2014  Sriram Febles M 11/20/2014, 9:37 AM

## 2014-11-21 ENCOUNTER — Emergency Department (HOSPITAL_COMMUNITY)
Admission: EM | Admit: 2014-11-21 | Discharge: 2014-11-22 | Disposition: A | Discharge status: Home / Self Care | Payer: Medicare Other | Source: Home / Self Care | Attending: Emergency Medicine | Admitting: Emergency Medicine

## 2014-11-21 ENCOUNTER — Encounter (HOSPITAL_COMMUNITY): Payer: Self-pay | Admitting: Oncology

## 2014-11-21 DIAGNOSIS — D571 Sickle-cell disease without crisis: Secondary | ICD-10-CM | POA: Diagnosis not present

## 2014-11-21 DIAGNOSIS — D5700 Hb-SS disease with crisis, unspecified: Secondary | ICD-10-CM

## 2014-11-21 NOTE — ED Notes (Signed)
Pt reports for typical sickle cell pain in his left side and back.  States he has used home meds w/o relief.

## 2014-11-22 LAB — CBC WITH DIFFERENTIAL/PLATELET
BASOS PCT: 1 %
Basophils Absolute: 0.2 10*3/uL — ABNORMAL HIGH (ref 0.0–0.1)
EOS ABS: 0.5 10*3/uL (ref 0.0–0.7)
Eosinophils Relative: 3 %
HCT: 22.3 % — ABNORMAL LOW (ref 39.0–52.0)
HEMOGLOBIN: 8 g/dL — AB (ref 13.0–17.0)
LYMPHS ABS: 3.2 10*3/uL (ref 0.7–4.0)
LYMPHS PCT: 18 %
MCH: 33.2 pg (ref 26.0–34.0)
MCHC: 35.9 g/dL (ref 30.0–36.0)
MCV: 92.5 fL (ref 78.0–100.0)
Monocytes Absolute: 2.3 10*3/uL — ABNORMAL HIGH (ref 0.1–1.0)
Monocytes Relative: 13 %
NEUTROS ABS: 11.4 10*3/uL — AB (ref 1.7–7.7)
Neutrophils Relative %: 65 %
Platelets: 440 10*3/uL — ABNORMAL HIGH (ref 150–400)
RBC: 2.41 MIL/uL — ABNORMAL LOW (ref 4.22–5.81)
RDW: 20.6 % — AB (ref 11.5–15.5)
WBC: 17.6 10*3/uL — ABNORMAL HIGH (ref 4.0–10.5)

## 2014-11-22 LAB — COMPREHENSIVE METABOLIC PANEL
ALT: 25 U/L (ref 17–63)
AST: 41 U/L (ref 15–41)
Albumin: 4.1 g/dL (ref 3.5–5.0)
Alkaline Phosphatase: 88 U/L (ref 38–126)
Anion gap: 6 (ref 5–15)
BUN: 9 mg/dL (ref 6–20)
CALCIUM: 8.7 mg/dL — AB (ref 8.9–10.3)
CHLORIDE: 109 mmol/L (ref 101–111)
CO2: 22 mmol/L (ref 22–32)
CREATININE: 0.67 mg/dL (ref 0.61–1.24)
Glucose, Bld: 112 mg/dL — ABNORMAL HIGH (ref 65–99)
POTASSIUM: 3.6 mmol/L (ref 3.5–5.1)
Sodium: 137 mmol/L (ref 135–145)
TOTAL PROTEIN: 7.6 g/dL (ref 6.5–8.1)
Total Bilirubin: 7.9 mg/dL — ABNORMAL HIGH (ref 0.3–1.2)

## 2014-11-22 LAB — RETICULOCYTES: RBC.: 2.41 MIL/uL — ABNORMAL LOW (ref 4.22–5.81)

## 2014-11-22 MED ORDER — SODIUM CHLORIDE 0.9 % IV BOLUS (SEPSIS): 1000.0000 mL | Freq: Once | INTRAVENOUS | Status: DC

## 2014-11-22 MED ORDER — DIPHENHYDRAMINE HCL 50 MG/ML IJ SOLN
12.5000 mg | Freq: Once | INTRAMUSCULAR | Status: AC
  Administered 2014-11-22: 12.5 mg via INTRAVENOUS
  Filled 2014-11-22: qty 1

## 2014-11-22 MED ORDER — SODIUM CHLORIDE 0.9 % IV BOLUS (SEPSIS)
1000.0000 mL | Freq: Once | INTRAVENOUS | Status: AC
  Administered 2014-11-22: 1000 mL via INTRAVENOUS

## 2014-11-22 MED ORDER — HYDROMORPHONE HCL 1 MG/ML IJ SOLN
1.0000 mg | Freq: Once | INTRAMUSCULAR | Status: DC
  Filled 2014-11-22: qty 1

## 2014-11-22 MED ORDER — HYDROMORPHONE HCL 1 MG/ML IJ SOLN
1.0000 mg | Freq: Once | INTRAMUSCULAR | Status: AC
  Administered 2014-11-22: 1 mg via INTRAVENOUS
  Filled 2014-11-22: qty 1

## 2014-11-22 MED ORDER — KETOROLAC TROMETHAMINE 30 MG/ML IJ SOLN
30.0000 mg | Freq: Once | INTRAMUSCULAR | Status: AC
  Administered 2014-11-22: 30 mg via INTRAVENOUS
  Filled 2014-11-22: qty 1

## 2014-11-22 MED ORDER — HYDROMORPHONE HCL 1 MG/ML IJ SOLN
1.0000 mg | INTRAMUSCULAR | Status: AC
  Administered 2014-11-22 (×2): 1 mg via INTRAVENOUS
  Filled 2014-11-22: qty 1

## 2014-11-22 NOTE — Discharge Instructions (Signed)
Sickle Cell Anemia, Adult °Sickle cell anemia is a condition in which red blood cells have an abnormal "sickle" shape. This abnormal shape shortens the cells' life span, which results in a lower than normal concentration of red blood cells in the blood. The sickle shape also causes the cells to clump together and block free blood flow through the blood vessels. As a result, the tissues and organs of the body do not receive enough oxygen. Sickle cell anemia causes organ damage and pain and increases the risk of infection. °CAUSES  °Sickle cell anemia is a genetic disorder. Those who receive two copies of the gene have the condition, and those who receive one copy have the trait. °RISK FACTORS °The sickle cell gene is most common in people whose families originated in [place]. Other areas of the globe where sickle cell trait occurs include the Mediterranean, [place] and [place], the [place], and the [place].  °SIGNS AND SYMPTOMS °· Pain, especially in the extremities, back, chest, or abdomen (common). The pain may start suddenly or may develop following an illness, especially if there is dehydration. Pain can also occur due to overexertion or exposure to extreme temperature changes. °· Frequent severe bacterial infections, especially certain types of pneumonia and meningitis. °· Pain and swelling in the hands and feet. °· Decreased activity.   °· Loss of appetite.   °· Change in behavior. °· Headaches. °· Seizures. °· Shortness of breath or difficulty breathing. °· Vision changes. °· Skin ulcers. °Those with the trait may not have symptoms or they may have mild symptoms.  °DIAGNOSIS  °Sickle cell anemia is diagnosed with blood tests that demonstrate the genetic trait. It is often diagnosed during the newborn period, due to mandatory testing nationwide. A variety of blood tests, X-rays, CT scans, MRI scans, ultrasounds, and lung function tests may also be done to monitor the condition. °TREATMENT  °Sickle  cell anemia may be treated with: °· Medicines. You may be given pain medicines, antibiotic medicines (to treat and prevent infections) or medicines to increase the production of certain types of hemoglobin. °· Fluids. °· Oxygen. °· Blood transfusions. °HOME CARE INSTRUCTIONS  °· Drink enough fluid to keep your urine clear or pale yellow. Increase your fluid intake in hot weather and during exercise. °· Do not smoke. Smoking lowers oxygen levels in the blood.   °· Only take over-the-counter or prescription medicines for pain, fever, or discomfort as directed by your health care provider. °· Take antibiotics as directed by your health care provider. Make sure you finish them it even if you start to feel better.   °· Take supplements as directed by your health care provider.   °· Consider wearing a medical alert bracelet. This tells anyone caring for you in an emergency of your condition.   °· When traveling, keep your medical information, health care provider's names, and the medicines you take with you at all times.   °· If you develop a fever, do not take medicines to reduce the fever right away. This could cover up a problem that is developing. Notify your health care provider. °· Keep all follow-up appointments with your health care provider. Sickle cell anemia requires regular medical care. °SEEK MEDICAL CARE IF: ° You have a fever. °SEEK IMMEDIATE MEDICAL CARE IF:  °· You feel dizzy or faint.   °· You have new abdominal pain, especially on the left side near the stomach area.   °· You develop a persistent, often uncomfortable and painful penile erection (priapism). If this is not treated immediately it   will lead to impotence.   You have numbness your arms or legs or you have a hard time moving them.   You have a hard time with speech.   You have a fever or persistent symptoms for more than 2-3 days.   You have a fever and your symptoms suddenly get worse.   You have signs or symptoms of infection.  These include:   Chills.   Abnormal tiredness (lethargy).   Irritability.   Poor eating.   Vomiting.   You develop pain that is not helped with medicine.   You develop shortness of breath.  You have pain in your chest.   You are coughing up pus-like or bloody sputum.   You develop a stiff neck.  Your feet or hands swell or have pain.  Your abdomen appears bloated.  You develop joint pain. MAKE SURE YOU:  Understand these instructions.   This information is not intended to replace advice given to you by your health care provider. Make sure you discuss any questions you have with your health care provider.   Document Released: 04/27/2005 Document Revised: 02/07/2014 Document Reviewed: 08/29/2012 Elsevier Interactive Patient Education [address].  Make appointment with your PCP for follow up care  Return for unmanageable pain

## 2014-11-22 NOTE — ED Provider Notes (Signed)
CSN: [id number]     Arrival date & time 11/21/14  2334 History   First MD Initiated Contact with Patient 11/22/14 0018     Chief Complaint  Patient presents with  . Sickle Cell Pain Crisis     (Consider location/radiation/quality/duration/timing/severity/associated sxs/prior Treatment) Patient is a 34 y.o. male presenting with sickle cell pain. The history is provided by the patient.  Sickle Cell Pain Crisis Location:  L side and R side Severity:  Severe Onset quality:  Gradual Similar to previous crisis episodes: yes   Timing:  Constant Progression:  Unchanged Chronicity:  Recurrent Sickle cell genotype:  SS Usual hemoglobin level:  7-8 Date of last transfusion:  2 months Frequency of attacks:  Frequent History of pulmonary emboli: yes   Context: cold exposure   Context: not change in medication, not dehydration, not infection and not non-compliance   Relieved by:  Nothing Worsened by:  Nothing tried Ineffective treatments:  Hydroxyurea, prescription drugs, fluids and rest Associated symptoms: no chest pain, no congestion, no cough, no fatigue, no fever, no nausea, no priapism, no shortness of breath, no sore throat, no swelling of legs, no vision change, no vomiting and no wheezing   Risk factors: cholecystectomy, frequent admissions for pain, frequent pain crises, hx of pneumonia and prior acute chest     Past Medical History  Diagnosis Date  . Sickle cell anemia (HCC)   . Blood transfusion   . Acute embolism and thrombosis of right internal jugular vein ([place])   . Hypokalemia   . Mood disorder ([place])   . History of pulmonary embolus (PE)   . Avascular necrosis ([place])   . Leukocytosis     Chronic  . Thrombocytosis (HCC)     Chronic  . Hypertension   . History of Clostridium difficile infection   . Uses marijuana   . Chronic anticoagulation   . Functional asplenia   . Former smoker   . Second hand tobacco smoke exposure   . Alcohol consumption of one to four  drinks per day   . Noncompliance with medication regimen   . Sickle-cell crisis with associated acute chest syndrome ([place]) 05/13/2013  . Acute chest syndrome ([place]) 06/18/2013  . Demand ischemia ([place]) 01/02/2014   Past Surgical History  Procedure Laterality Date  . Right hip replacement      08/2006  . Cholecystectomy      01/2008  . Porta cath placement    . Porta cath removal    . Umbilical hernia repair      01/2008  . Excision of left periauricular cyst      10/2009  . Excision of right ear lobe cyst with primary closur      11/2007  . Portacath placement  01/05/2012    Procedure: INSERTION PORT-A-CATH;  Surgeon: Odis Hollingshead, MD;  Location: [facility];  Service: General;  Laterality: N/A;  ultrasound guiced port a cath insertion with fluoroscopy   Family History  Problem Relation Age of Onset  . Sickle cell trait Mother   . Depression Mother   . Diabetes Mother   . Sickle cell trait Father   . Sickle cell trait Brother    Social History  Substance Use Topics  . Smoking status: Former Smoker -- 13 years  . Smokeless tobacco: Never Used  . Alcohol Use: No    Review of Systems  Constitutional: Negative for fever and fatigue.  HENT: Negative for congestion and sore throat.   Respiratory: Negative for cough,  shortness of breath and wheezing.   Cardiovascular: Negative for chest pain.  Gastrointestinal: Negative for nausea and vomiting.  Genitourinary: Negative for dysuria and flank pain.  Skin: Negative for rash.  All other systems reviewed and are negative.     Allergies  Review of patient's allergies indicates no known allergies.  Home Medications   Prior to Admission medications   Medication Sig Start Date End Date Taking? Authorizing Provider  Cholecalciferol (VITAMIN D) 2000 UNITS tablet Take 1 tablet (2,000 Units total) by mouth daily. 02/13/14  Yes [name], MD  diltiazem (CARDIZEM CD) 120 MG 24 hr capsule Take 1 capsule (120 mg total) by mouth  daily. 03/13/14  Yes Costin Karlyne Greenspan, MD  enoxaparin (LOVENOX) 150 MG/ML injection Inject 0.73 mLs (110 mg total) into the skin daily. 09/26/14  Yes Micheline Chapman, NP  folic acid (FOLVITE) 1 MG tablet Take 1 tablet (1 mg total) by mouth every morning. 10/08/13  Yes [name], MD  HYDROmorphone (DILAUDID) 4 MG tablet Take 1 tablet (4 mg total) by mouth every 4 (four) hours as needed for severe pain. 11/06/14  Yes Dorena Dew, FNP  hydroxyurea (HYDREA) 500 MG capsule Take 1 capsule (500 mg total) by mouth daily. May take with food to minimize GI side effects. 11/06/14  Yes Dorena Dew, FNP  lisinopril (PRINIVIL,ZESTRIL) 10 MG tablet Take 1 tablet (10 mg total) by mouth daily. 01/22/14  Yes [name], MD  metoprolol succinate (TOPROL-XL) 25 MG 24 hr tablet Take 25 mg by mouth daily.   Yes Historical Provider, MD  morphine (MS CONTIN) 30 MG 12 hr tablet Take 1 tablet (30 mg total) by mouth every 12 (twelve) hours. 11/18/14  Yes Tresa Garter, MD  potassium chloride SA (K-DUR,KLOR-CON) 20 MEQ tablet Take 1 tablet (20 mEq total) by mouth every morning. 06/09/14  Yes [name], MD  zolpidem (AMBIEN) 10 MG tablet Take 1 tablet (10 mg total) by mouth at bedtime as needed for sleep. 10/23/14  Yes Tresa Garter, MD  aspirin 81 MG chewable tablet Chew 1 tablet (81 mg total) by mouth daily. Patient not taking: Reported on 11/18/2014 07/23/14   [name], MD   BP 109/82 mmHg  Pulse 74  Temp(Src) 98.2 F (36.8 C)  Resp 18  Ht 6' (1.829 m)  Wt 165 lb (74.844 kg)  BMI 22.37 kg/m2  SpO2 94% Physical Exam  Constitutional: He is oriented to person, place, and time. He appears well-developed and well-nourished.  HENT:  Head: Normocephalic.  Eyes: Pupils are equal, round, and reactive to light. Scleral icterus is present.  Neck: Normal range of motion.  Cardiovascular: Normal rate and regular rhythm.   Pulmonary/Chest: Effort normal and breath sounds  normal.  Abdominal: Soft. Bowel sounds are normal. He exhibits no distension. There is no tenderness.  Musculoskeletal: He exhibits no edema or tenderness.  Neurological: He is alert and oriented to person, place, and time.  Skin: Skin is warm. There is pallor.  Nursing note and vitals reviewed.   ED Course  Procedures (including critical care time) Labs Review Labs Reviewed  COMPREHENSIVE METABOLIC PANEL - Abnormal; Notable for the following:    Glucose, Bld 112 (*)    Calcium 8.7 (*)    Total Bilirubin 7.9 (*)    All other components within normal limits  CBC WITH DIFFERENTIAL/PLATELET - Abnormal; Notable for the following:    WBC 17.6 (*)    RBC 2.41 (*)  Hemoglobin 8.0 (*)    HCT 22.3 (*)    RDW 20.6 (*)    Platelets 440 (*)    Neutro Abs 11.4 (*)    Monocytes Absolute 2.3 (*)    Basophils Absolute 0.2 (*)    All other components within normal limits  RETICULOCYTES - Abnormal; Notable for the following:    Retic Ct Pct >23.0 (*)    RBC. 2.41 (*)    All other components within normal limits    Imaging Review No results found. I have personally reviewed and evaluated these images and lab results as part of my medical decision-making.   EKG Interpretation None    reassessed pain now 5/10 states he thinks he can make it at home after he gets additions fluids and pain meds here.   MDM   Final diagnoses:  Sickle-cell disease with pain ([place])         Junius Creamer, NP 02/54/27 0623  [name], MD 76/28/31 5176

## 2014-11-23 ENCOUNTER — Inpatient Hospital Stay (HOSPITAL_COMMUNITY)
Admission: EM | Admit: 2014-11-23 | Discharge: 2014-11-28 | DRG: 811 | Disposition: A | Discharge status: Home / Self Care | Payer: Medicare Other | Attending: Internal Medicine | Admitting: Internal Medicine

## 2014-11-23 ENCOUNTER — Encounter (HOSPITAL_COMMUNITY): Payer: Self-pay | Admitting: *Deleted

## 2014-11-23 DIAGNOSIS — R0902 Hypoxemia: Secondary | ICD-10-CM | POA: Diagnosis not present

## 2014-11-23 DIAGNOSIS — I2721 Secondary pulmonary arterial hypertension: Secondary | ICD-10-CM

## 2014-11-23 DIAGNOSIS — J9601 Acute respiratory failure with hypoxia: Secondary | ICD-10-CM | POA: Diagnosis not present

## 2014-11-23 DIAGNOSIS — Z7722 Contact with and (suspected) exposure to environmental tobacco smoke (acute) (chronic): Secondary | ICD-10-CM | POA: Diagnosis present

## 2014-11-23 DIAGNOSIS — Z833 Family history of diabetes mellitus: Secondary | ICD-10-CM | POA: Diagnosis not present

## 2014-11-23 DIAGNOSIS — F1290 Cannabis use, unspecified, uncomplicated: Secondary | ICD-10-CM | POA: Diagnosis present

## 2014-11-23 DIAGNOSIS — J9621 Acute and chronic respiratory failure with hypoxia: Secondary | ICD-10-CM | POA: Diagnosis not present

## 2014-11-23 DIAGNOSIS — D72829 Elevated white blood cell count, unspecified: Secondary | ICD-10-CM | POA: Diagnosis not present

## 2014-11-23 DIAGNOSIS — I272 Other secondary pulmonary hypertension: Secondary | ICD-10-CM | POA: Diagnosis not present

## 2014-11-23 DIAGNOSIS — I10 Essential (primary) hypertension: Secondary | ICD-10-CM | POA: Diagnosis present

## 2014-11-23 DIAGNOSIS — D638 Anemia in other chronic diseases classified elsewhere: Secondary | ICD-10-CM | POA: Diagnosis not present

## 2014-11-23 DIAGNOSIS — I2699 Other pulmonary embolism without acute cor pulmonale: Secondary | ICD-10-CM | POA: Diagnosis not present

## 2014-11-23 DIAGNOSIS — I482 Chronic atrial fibrillation: Secondary | ICD-10-CM | POA: Diagnosis present

## 2014-11-23 DIAGNOSIS — G894 Chronic pain syndrome: Secondary | ICD-10-CM | POA: Diagnosis not present

## 2014-11-23 DIAGNOSIS — Q8901 Asplenia (congenital): Secondary | ICD-10-CM | POA: Diagnosis not present

## 2014-11-23 DIAGNOSIS — J9611 Chronic respiratory failure with hypoxia: Secondary | ICD-10-CM

## 2014-11-23 DIAGNOSIS — Z9981 Dependence on supplemental oxygen: Secondary | ICD-10-CM | POA: Diagnosis not present

## 2014-11-23 DIAGNOSIS — Z7982 Long term (current) use of aspirin: Secondary | ICD-10-CM | POA: Diagnosis not present

## 2014-11-23 DIAGNOSIS — D5700 Hb-SS disease with crisis, unspecified: Secondary | ICD-10-CM

## 2014-11-23 DIAGNOSIS — Z9119 Patient's noncompliance with other medical treatment and regimen: Secondary | ICD-10-CM | POA: Diagnosis not present

## 2014-11-23 DIAGNOSIS — Z87891 Personal history of nicotine dependence: Secondary | ICD-10-CM | POA: Diagnosis not present

## 2014-11-23 DIAGNOSIS — I2781 Cor pulmonale (chronic): Secondary | ICD-10-CM | POA: Diagnosis present

## 2014-11-23 DIAGNOSIS — R0602 Shortness of breath: Secondary | ICD-10-CM | POA: Diagnosis not present

## 2014-11-23 DIAGNOSIS — D57819 Other sickle-cell disorders with crisis, unspecified: Secondary | ICD-10-CM | POA: Diagnosis present

## 2014-11-23 DIAGNOSIS — Z96641 Presence of right artificial hip joint: Secondary | ICD-10-CM | POA: Diagnosis present

## 2014-11-23 DIAGNOSIS — I2690 Septic pulmonary embolism without acute cor pulmonale: Secondary | ICD-10-CM | POA: Diagnosis not present

## 2014-11-23 DIAGNOSIS — R55 Syncope and collapse: Secondary | ICD-10-CM | POA: Diagnosis not present

## 2014-11-23 DIAGNOSIS — E869 Volume depletion, unspecified: Secondary | ICD-10-CM | POA: Diagnosis not present

## 2014-11-23 DIAGNOSIS — I2609 Other pulmonary embolism with acute cor pulmonale: Secondary | ICD-10-CM | POA: Diagnosis not present

## 2014-11-23 DIAGNOSIS — Z7901 Long term (current) use of anticoagulants: Secondary | ICD-10-CM | POA: Diagnosis not present

## 2014-11-23 LAB — COMPREHENSIVE METABOLIC PANEL
ALBUMIN: 3.9 g/dL (ref 3.5–5.0)
ALBUMIN: 4.1 g/dL (ref 3.5–5.0)
ALT: 23 U/L (ref 17–63)
ALT: 22 U/L (ref 17–63)
ANION GAP: 7 (ref 5–15)
AST: 39 U/L (ref 15–41)
AST: 42 U/L — ABNORMAL HIGH (ref 15–41)
Alkaline Phosphatase: 70 U/L (ref 38–126)
Alkaline Phosphatase: 68 U/L (ref 38–126)
Anion gap: 5 (ref 5–15)
BILIRUBIN TOTAL: 8.4 mg/dL — AB (ref 0.3–1.2)
BILIRUBIN TOTAL: 8.1 mg/dL — AB (ref 0.3–1.2)
BUN: 8 mg/dL (ref 6–20)
BUN: 9 mg/dL (ref 6–20)
CHLORIDE: 112 mmol/L — AB (ref 101–111)
CO2: 21 mmol/L — AB (ref 22–32)
CO2: 21 mmol/L — ABNORMAL LOW (ref 22–32)
Calcium: 8.9 mg/dL (ref 8.9–10.3)
Calcium: 8.8 mg/dL — ABNORMAL LOW (ref 8.9–10.3)
Chloride: 112 mmol/L — ABNORMAL HIGH (ref 101–111)
Creatinine, Ser: 0.53 mg/dL — ABNORMAL LOW (ref 0.61–1.24)
Creatinine, Ser: 0.76 mg/dL (ref 0.61–1.24)
GFR calc Af Amer: >60 mL/min (ref >60)
GFR calc Af Amer: >60 mL/min (ref >60)
GFR calc non Af Amer: >60 mL/min (ref >60)
GLUCOSE: 91 mg/dL (ref 65–99)
Glucose, Bld: 94 mg/dL (ref 65–99)
POTASSIUM: 3.5 mmol/L (ref 3.5–5.1)
POTASSIUM: 3.5 mmol/L (ref 3.5–5.1)
SODIUM: 138 mmol/L (ref 135–145)
Sodium: 140 mmol/L (ref 135–145)
TOTAL PROTEIN: 7.2 g/dL (ref 6.5–8.1)
TOTAL PROTEIN: 7.4 g/dL (ref 6.5–8.1)

## 2014-11-23 LAB — URINALYSIS, ROUTINE W REFLEX MICROSCOPIC
Bilirubin Urine: NEGATIVE
GLUCOSE, UA: NEGATIVE mg/dL
Ketones, ur: NEGATIVE mg/dL
LEUKOCYTES UA: NEGATIVE
NITRITE: NEGATIVE
PH: 7 (ref 5.0–8.0)
Protein, ur: NEGATIVE mg/dL
SPECIFIC GRAVITY, URINE: 1.01 (ref 1.005–1.030)
Urobilinogen, UA: 2 mg/dL — ABNORMAL HIGH (ref 0.0–1.0)

## 2014-11-23 LAB — MAGNESIUM: Magnesium: 1.5 mg/dL — ABNORMAL LOW (ref 1.7–2.4)

## 2014-11-23 LAB — CBC WITH DIFFERENTIAL/PLATELET
BASOS ABS: 0.2 10*3/uL — AB (ref 0.0–0.1)
BASOS ABS: 0.2 10*3/uL — AB (ref 0.0–0.1)
BASOS PCT: 1 %
Basophils Relative: 1 %
Eosinophils Absolute: 0.5 10*3/uL (ref 0.0–0.7)
Eosinophils Absolute: 0.5 10*3/uL (ref 0.0–0.7)
Eosinophils Relative: 3 %
Eosinophils Relative: 3 %
HCT: 21.6 % — ABNORMAL LOW (ref 39.0–52.0)
HEMATOCRIT: 21.2 % — AB (ref 39.0–52.0)
HEMOGLOBIN: 7.4 g/dL — AB (ref 13.0–17.0)
Hemoglobin: 7.5 g/dL — ABNORMAL LOW (ref 13.0–17.0)
LYMPHS ABS: 2.7 10*3/uL (ref 0.7–4.0)
LYMPHS PCT: 17 %
Lymphocytes Relative: 16 %
Lymphs Abs: 2.9 10*3/uL (ref 0.7–4.0)
MCH: 31.9 pg (ref 26.0–34.0)
MCH: 32.1 pg (ref 26.0–34.0)
MCHC: 34.9 g/dL (ref 30.0–36.0)
MCHC: 34.7 g/dL (ref 30.0–36.0)
MCV: 91.4 fL (ref 78.0–100.0)
MCV: 92.3 fL (ref 78.0–100.0)
MONO ABS: 2 10*3/uL — AB (ref 0.1–1.0)
MONOS PCT: 12 %
Monocytes Absolute: 2 10*3/uL — ABNORMAL HIGH (ref 0.1–1.0)
Monocytes Relative: 12 %
NEUTROS ABS: 11.2 10*3/uL — AB (ref 1.7–7.7)
NEUTROS PCT: 68 %
Neutro Abs: 11.2 10*3/uL — ABNORMAL HIGH (ref 1.7–7.7)
Neutrophils Relative %: 67 %
PLATELETS: 439 10*3/uL — AB (ref 150–400)
Platelets: 443 10*3/uL — ABNORMAL HIGH (ref 150–400)
RBC: 2.32 MIL/uL — ABNORMAL LOW (ref 4.22–5.81)
RBC: 2.34 MIL/uL — ABNORMAL LOW (ref 4.22–5.81)
RDW: 20.9 % — ABNORMAL HIGH (ref 11.5–15.5)
RDW: 21.2 % — AB (ref 11.5–15.5)
WBC: 16.8 10*3/uL — ABNORMAL HIGH (ref 4.0–10.5)
WBC: 16.6 10*3/uL — AB (ref 4.0–10.5)
nRBC: 3 /100 WBC — ABNORMAL HIGH

## 2014-11-23 LAB — RETICULOCYTES
RBC.: 2.32 MIL/uL — AB (ref 4.22–5.81)
RBC.: 2.34 MIL/uL — AB (ref 4.22–5.81)
Retic Ct Pct: >23 % — ABNORMAL HIGH (ref 0.4–3.1)
Retic Ct Pct: >23 % — ABNORMAL HIGH (ref 0.4–3.1)

## 2014-11-23 LAB — LACTATE DEHYDROGENASE: LDH: 433 U/L — AB (ref 98–192)

## 2014-11-23 LAB — URINE MICROSCOPIC-ADD ON

## 2014-11-23 MED ORDER — ENOXAPARIN SODIUM 150 MG/ML ~~LOC~~ SOLN
110.0000 mg | SUBCUTANEOUS | Status: DC
  Administered 2014-11-23 – 2014-11-27 (×5): 110 mg via SUBCUTANEOUS
  Filled 2014-11-23 (×6): qty 1

## 2014-11-23 MED ORDER — METOPROLOL SUCCINATE ER 25 MG PO TB24
25.0000 mg | ORAL_TABLET | Freq: Every day | ORAL | Status: DC
  Administered 2014-11-23 – 2014-11-28 (×6): 25 mg via ORAL
  Filled 2014-11-23 (×6): qty 1

## 2014-11-23 MED ORDER — ONDANSETRON HCL 4 MG/2ML IJ SOLN
4.0000 mg | Freq: Four times a day (QID) | INTRAMUSCULAR | Status: DC | PRN
  Administered 2014-11-25: 4 mg via INTRAVENOUS
  Filled 2014-11-23: qty 2

## 2014-11-23 MED ORDER — VITAMIN D3 25 MCG (1000 UNIT) PO TABS
2000.0000 [IU] | ORAL_TABLET | Freq: Every day | ORAL | Status: DC
  Administered 2014-11-24 – 2014-11-28 (×5): 2000 [IU] via ORAL
  Filled 2014-11-23 (×11): qty 2

## 2014-11-23 MED ORDER — DIPHENHYDRAMINE HCL 50 MG/ML IJ SOLN
25.0000 mg | Freq: Once | INTRAMUSCULAR | Status: AC
  Administered 2014-11-23: 25 mg via INTRAVENOUS
  Filled 2014-11-23: qty 1

## 2014-11-23 MED ORDER — HYDROMORPHONE 1 MG/ML IV SOLN
INTRAVENOUS | Status: DC
  Administered 2014-11-23: 7 mg via INTRAVENOUS
  Administered 2014-11-23: 1 mg via INTRAVENOUS
  Administered 2014-11-24: 1.8 mg via INTRAVENOUS
  Administered 2014-11-24: 3.6 mg via INTRAVENOUS
  Filled 2014-11-23: qty 25

## 2014-11-23 MED ORDER — MORPHINE SULFATE ER 30 MG PO TBCR
30.0000 mg | EXTENDED_RELEASE_TABLET | Freq: Two times a day (BID) | ORAL | Status: DC
  Administered 2014-11-23 – 2014-11-28 (×10): 30 mg via ORAL
  Filled 2014-11-23 (×10): qty 1

## 2014-11-23 MED ORDER — SODIUM CHLORIDE 0.9 % IV SOLN
INTRAVENOUS | Status: DC
  Administered 2014-11-23: 20:00:00 via INTRAVENOUS
  Administered 2014-11-24: 1000 mL via INTRAVENOUS

## 2014-11-23 MED ORDER — DIPHENHYDRAMINE HCL 50 MG/ML IJ SOLN
12.5000 mg | Freq: Four times a day (QID) | INTRAMUSCULAR | Status: DC | PRN
  Administered 2014-11-23 – 2014-11-24 (×2): 12.5 mg via INTRAVENOUS
  Filled 2014-11-23 (×2): qty 1

## 2014-11-23 MED ORDER — LISINOPRIL 10 MG PO TABS
10.0000 mg | ORAL_TABLET | Freq: Every day | ORAL | Status: DC
  Administered 2014-11-24 – 2014-11-28 (×5): 10 mg via ORAL
  Filled 2014-11-23 (×7): qty 1

## 2014-11-23 MED ORDER — ZOLPIDEM TARTRATE 10 MG PO TABS
10.0000 mg | ORAL_TABLET | Freq: Every evening | ORAL | Status: DC | PRN
Start: 2014-11-23 — End: 2014-11-28
  Administered 2014-11-23 – 2014-11-28 (×3): 10 mg via ORAL
  Filled 2014-11-23 (×3): qty 1

## 2014-11-23 MED ORDER — SODIUM CHLORIDE 0.9 % IV BOLUS (SEPSIS)
1000.0000 mL | INTRAVENOUS | Status: AC
  Administered 2014-11-23: 1000 mL via INTRAVENOUS

## 2014-11-23 MED ORDER — SODIUM CHLORIDE 0.9 % IJ SOLN: 9.0000 mL | INTRAMUSCULAR | Status: DC | PRN

## 2014-11-23 MED ORDER — HYDROMORPHONE HCL 2 MG/ML IJ SOLN
2.0000 mg | Freq: Once | INTRAMUSCULAR | Status: AC
  Administered 2014-11-23: 2 mg via INTRAVENOUS
  Filled 2014-11-23: qty 1

## 2014-11-23 MED ORDER — SENNOSIDES-DOCUSATE SODIUM 8.6-50 MG PO TABS
1.0000 | ORAL_TABLET | Freq: Two times a day (BID) | ORAL | Status: DC
  Administered 2014-11-23 – 2014-11-28 (×10): 1 via ORAL
  Filled 2014-11-23 (×10): qty 1

## 2014-11-23 MED ORDER — KETOROLAC TROMETHAMINE 30 MG/ML IJ SOLN
30.0000 mg | Freq: Four times a day (QID) | INTRAMUSCULAR | Status: DC
  Administered 2014-11-23 – 2014-11-28 (×19): 30 mg via INTRAVENOUS
  Filled 2014-11-23 (×19): qty 1

## 2014-11-23 MED ORDER — DIPHENHYDRAMINE HCL 50 MG/ML IJ SOLN
12.5000 mg | Freq: Once | INTRAMUSCULAR | Status: AC
  Administered 2014-11-23: 12.5 mg via INTRAVENOUS
  Filled 2014-11-23: qty 1

## 2014-11-23 MED ORDER — DILTIAZEM HCL ER COATED BEADS 120 MG PO CP24
120.0000 mg | ORAL_CAPSULE | Freq: Every day | ORAL | Status: DC
  Administered 2014-11-24 – 2014-11-28 (×5): 120 mg via ORAL
  Filled 2014-11-23 (×5): qty 1

## 2014-11-23 MED ORDER — FOLIC ACID 1 MG PO TABS
1.0000 mg | ORAL_TABLET | Freq: Every morning | ORAL | Status: DC
  Administered 2014-11-24 – 2014-11-28 (×5): 1 mg via ORAL
  Filled 2014-11-23 (×5): qty 1

## 2014-11-23 MED ORDER — POLYETHYLENE GLYCOL 3350 17 G PO PACK: 17.0000 g | PACK | Freq: Every day | ORAL | Status: DC | PRN

## 2014-11-23 MED ORDER — DIPHENHYDRAMINE HCL 12.5 MG/5ML PO ELIX: 12.5000 mg | ORAL_SOLUTION | Freq: Four times a day (QID) | ORAL | Status: DC | PRN

## 2014-11-23 MED ORDER — HYDROMORPHONE HCL 2 MG/ML IJ SOLN
2.0000 mg | INTRAMUSCULAR | Status: DC | PRN
  Administered 2014-11-23 – 2014-11-24 (×4): 2 mg via INTRAVENOUS
  Filled 2014-11-23 (×4): qty 1

## 2014-11-23 MED ORDER — NALOXONE HCL 0.4 MG/ML IJ SOLN: 0.4000 mg | INTRAMUSCULAR | Status: DC | PRN

## 2014-11-23 MED ORDER — POTASSIUM CHLORIDE CRYS ER 20 MEQ PO TBCR
20.0000 meq | EXTENDED_RELEASE_TABLET | Freq: Every morning | ORAL | Status: DC
  Administered 2014-11-24 – 2014-11-28 (×5): 20 meq via ORAL
  Filled 2014-11-23 (×5): qty 1

## 2014-11-23 MED ORDER — HYDROXYUREA 500 MG PO CAPS
500.0000 mg | ORAL_CAPSULE | Freq: Every day | ORAL | Status: DC
  Administered 2014-11-23 – 2014-11-25 (×3): 500 mg via ORAL
  Filled 2014-11-23 (×3): qty 1

## 2014-11-23 NOTE — ED Provider Notes (Cosign Needed)
  Physical Exam  BP 124/91 mmHg  Pulse 110  Temp(Src) 98.5 F (36.9 C) (Oral)  Resp 21  SpO2 95%  Physical Exam  ED Course  Procedures  Patient with history of sickle cell who presents for right-sided chest pain. He normally takes 4mg  Dilaudid PRN for pain. Patient was signed out to me by Harlene Ramus, PA-C who stated that she gave the patient 3 doses of Dilaudid. She called to admit the patient but Dr. Jonelle Sidle requested to give the patient one additional dose of Dilaudid. He stated he would see the patient in the ED since he is familiar with the patient. After seeing the patient he will determine if the patient needs admission. Dr. Jonelle Sidle did not feel that the patient needed additional x-ray and that he had one done last week which was normal.  Recheck: Patient states after fourth dose of Dilaudid his pain is still 6/10. Dr. Jonelle Sidle was paged and stated that he would come and see the patient himself. Patient was admitted.        Ottie Glazier, PA-C 11/23/14 1804

## 2014-11-23 NOTE — H&P (Signed)
[name] is an 34 y.o. male.   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 Patient presented to the ER with 3 days of progressive pain in his back and legs. Pain got worse this morning. He has taken his home medications but no relief. Denied any shortness of breath or cough denied any fever no nausea vomiting or diarrhea. He is supposed to be on home oxygen due to significant pulmonary hypertension. Patient has not been using his oxygen as he show. Denied any fever or cough. No significant shortness of breath. Patient has received up to 8 mg of Dilaudid in the ER and pain is currently a 7 out of 10 sharp involving his limbs and lower back and not getting any better.  Past Medical History  Diagnosis Date  . Sickle cell anemia (HCC)   . Blood transfusion   . Acute embolism and thrombosis of right internal jugular vein ([place])   . Hypokalemia   . Mood disorder ([place])   . History of pulmonary embolus (PE)   . Avascular necrosis ([place])   . Leukocytosis     Chronic  . Thrombocytosis (HCC)     Chronic  . Hypertension   . History of Clostridium difficile infection   . Uses marijuana   . Chronic anticoagulation   . Functional asplenia   . Former smoker   . Second hand tobacco smoke exposure   . Alcohol consumption of one to four drinks per day   . Noncompliance with medication regimen   . Sickle-cell crisis with associated acute chest syndrome ([place]) 05/13/2013  . Acute chest syndrome ([place]) 06/18/2013  . Demand ischemia ([place]) 01/02/2014    Past Surgical History  Procedure Laterality Date  . Right hip replacement      08/2006  . Cholecystectomy      01/2008  . Porta cath placement    . Porta cath removal    . Umbilical hernia repair      01/2008  . Excision of left periauricular cyst      10/2009  . Excision of right ear lobe cyst with primary closur       11/2007  . Portacath placement  01/05/2012    Procedure: INSERTION PORT-A-CATH;  Surgeon: Odis Hollingshead, MD;  Location: [facility];  Service: General;  Laterality: N/A;  ultrasound guiced port a cath insertion with fluoroscopy    Family History  Problem Relation Age of Onset  . Sickle cell trait Mother   . Depression Mother   . Diabetes Mother   . Sickle cell trait Father   . Sickle cell trait Brother    Social History:  reports that he has quit smoking. He has never used smokeless tobacco. He reports that he uses illicit drugs (Marijuana) about twice per week. He reports that he does not drink alcohol.  Allergies: No Known Allergies   (Not in a hospital admission)  Results for orders placed or performed during the hospital encounter of 11/23/14 (from the past 48 hour(s))  CBC WITH DIFFERENTIAL     Status: Abnormal   Collection Time: 11/23/14  1:27 PM  Result Value Ref Range   WBC 16.8 (H) 4.0 - 10.5 K/uL   RBC 2.32 (L) 4.22 - 5.81 MIL/uL   Hemoglobin 7.4 (L) 13.0 - 17.0 g/dL   HCT 21.2 (L) 39.0 - 52.0 %   MCV  91.4 78.0 - 100.0 fL   MCH 31.9 26.0 - 34.0 pg   MCHC 34.9 30.0 - 36.0 g/dL   RDW 20.9 (H) 11.5 - 15.5 %   Platelets 443 (H) 150 - 400 K/uL   Neutrophils Relative % 67 %   Lymphocytes Relative 17 %   Monocytes Relative 12 %   Eosinophils Relative 3 %   Basophils Relative 1 %   Neutro Abs 11.2 (H) 1.7 - 7.7 K/uL   Lymphs Abs 2.9 0.7 - 4.0 K/uL   Monocytes Absolute 2.0 (H) 0.1 - 1.0 K/uL   Eosinophils Absolute 0.5 0.0 - 0.7 K/uL   Basophils Absolute 0.2 (H) 0.0 - 0.1 K/uL   RBC Morphology POLYCHROMASIA PRESENT     Comment: TARGET CELLS OVAL MACROCYTES SICKLE CELLS   Reticulocytes     Status: Abnormal   Collection Time: 11/23/14  1:27 PM  Result Value Ref Range   Retic Ct Pct >23.0 (H) 0.4 - 3.1 %    Comment: RESULTS CONFIRMED BY MANUAL DILUTION   RBC. 2.32 (L) 4.22 - 5.81 MIL/uL   Retic Count, Manual NOT CALCULATED 19.0 - 186.0 K/uL  Comprehensive  metabolic panel     Status: Abnormal   Collection Time: 11/23/14  1:27 PM  Result Value Ref Range   Sodium 138 135 - 145 mmol/L   Potassium 3.5 3.5 - 5.1 mmol/L   Chloride 112 (H) 101 - 111 mmol/L   CO2 21 (L) 22 - 32 mmol/L   Glucose, Bld 91 65 - 99 mg/dL   BUN 8 6 - 20 mg/dL   Creatinine, Ser 0.53 (L) 0.61 - 1.24 mg/dL   Calcium 8.9 8.9 - 10.3 mg/dL   Total Protein 7.2 6.5 - 8.1 g/dL   Albumin 3.9 3.5 - 5.0 g/dL   AST 39 15 - 41 U/L   ALT 23 17 - 63 U/L   Alkaline Phosphatase 70 38 - 126 U/L   Total Bilirubin 8.4 (H) 0.3 - 1.2 mg/dL   GFR calc non Af Amer >60 >60 mL/min   GFR calc Af Amer >60 >60 mL/min    Comment: (NOTE) The eGFR has been calculated using the CKD EPI equation. This calculation has not been validated in all clinical situations. eGFR's persistently <60 mL/min signify possible Chronic Kidney Disease.    Anion gap 5 5 - 15  Urinalysis, Routine w reflex microscopic     Status: Abnormal   Collection Time: 11/23/14  3:04 PM  Result Value Ref Range   Color, Urine AMBER (A) YELLOW    Comment: BIOCHEMICALS MAY BE AFFECTED BY COLOR   APPearance CLEAR CLEAR   Specific Gravity, Urine 1.010 1.005 - 1.030   pH 7.0 5.0 - 8.0   Glucose, UA NEGATIVE NEGATIVE mg/dL   Hgb urine dipstick TRACE (A) NEGATIVE   Bilirubin Urine NEGATIVE NEGATIVE   Ketones, ur NEGATIVE NEGATIVE mg/dL   Protein, ur NEGATIVE NEGATIVE mg/dL   Urobilinogen, UA 2.0 (H) 0.0 - 1.0 mg/dL   Nitrite NEGATIVE NEGATIVE   Leukocytes, UA NEGATIVE NEGATIVE  Urine microscopic-add on     Status: None   Collection Time: 11/23/14  3:04 PM  Result Value Ref Range   RBC / HPF 0-2 <3 RBC/hpf   *Note: Due to a large number of results and/or encounters for the requested time period, some results have not been displayed. A complete set of results can be found in Results Review.   No results found.  Review of Systems  Constitutional: Negative.  HENT: Negative.   Eyes: Negative.   Respiratory: Negative.    Cardiovascular: Negative.   Gastrointestinal: Negative.   Genitourinary: Negative.   Musculoskeletal: Positive for myalgias. Negative for back pain, falls and neck pain.  Skin: Negative.   Neurological: Negative.   Endo/Heme/Allergies: Negative.   Psychiatric/Behavioral: Negative.     Blood pressure 124/91, pulse 110, temperature 98.5 F (36.9 C), temperature source Oral, resp. rate 21, SpO2 95 %. Physical Exam  Constitutional: He is oriented to person, place, and time. He appears well-developed and well-nourished.  HENT:  Head: Normocephalic and atraumatic.  Eyes: Conjunctivae are normal. Pupils are equal, round, and reactive to light.  Neck: Normal range of motion. Neck supple.  Cardiovascular: Normal rate and regular rhythm.   Respiratory: Effort normal and breath sounds normal.  GI: Soft. Bowel sounds are normal.  Musculoskeletal: Normal range of motion. He exhibits tenderness. He exhibits no edema.  Neurological: He is alert and oriented to person, place, and time. No cranial nerve deficit. Coordination normal.  Skin: Skin is warm and dry.  Psychiatric: He has a normal mood and affect.     Assessment/Plan A 34 year old gentleman admitted with sickle cell painful crisis.  #1 sickle cell painful crisis: Patient is still having the pain at 7 out of 10. He has noted progressive effectively after 8 mg of Dilaudid in the ER so far. We'll admitted to the hospital start Dilaudid PCA with Toradol and IV fluids. Continue to monitor pain.  #2 sickle cell anemia: Patient's hemoglobin is at baseline. Continue treatment  #3 pulmonary hypertension: Patient is on oxygen at home we will continue in the hospital.  #4 chronic anticoagulation: On Lovenox at home subcutaneously. We will continue in the hospital.  #5 leukocytosis: White cell is elevated chronically due to vaso-occlusive crisis. Also chronic asplenia.  Kamren Heintzelman,[name] 11/23/2014, 6:03 PM

## 2014-11-23 NOTE — ED Provider Notes (Signed)
CSN: [id number]     Arrival date & time 11/23/14  1235 History   First MD Initiated Contact with Patient 11/23/14 1304     Chief Complaint  Patient presents with  . Sickle Cell Pain Crisis     (Consider location/radiation/quality/duration/timing/severity/associated sxs/prior Treatment) HPI Comments: Patient is a 34 year old male with history of sickle cell anemia, pulmonary embolus (on Lovenox), acute chest syndrome, pulmonary hypertension who presents to the ED with complaint of right rib/chest pain, onset this morning. He notes after waking up this morning he has had worsening pain.  He reports having constant 8/10 right sided rib pain, no aggravating or alleviating factors. No relief with 4mg  dilaudid or OxyContin at home. Denies fever, chills, SOB, cough, back pain, leg pain, hip pain. Patient states he was seen in the ED 2 days ago for similar pain, pain improved in the ED and he was d/c home. Pt reports he has been taking his lovenox injections daily but has not taken his dose yet today. Pt is followed by Dr. Zigmund Daniel at the sickle cell clinic.   Past Medical History  Diagnosis Date  . Sickle cell anemia (HCC)   . Blood transfusion   . Acute embolism and thrombosis of right internal jugular vein ([place])   . Hypokalemia   . Mood disorder ([place])   . History of pulmonary embolus (PE)   . Avascular necrosis (HCC)     Right Hip  . Leukocytosis     Chronic  . Thrombocytosis (HCC)     Chronic  . Hypertension   . History of Clostridium difficile infection   . Uses marijuana   . Chronic anticoagulation   . Functional asplenia   . Former smoker   . Second hand tobacco smoke exposure   . Alcohol consumption of one to four drinks per day   . Noncompliance with medication regimen   . Sickle-cell crisis with associated acute chest syndrome ([place]) 05/13/2013  . Acute chest syndrome ([place]) 06/18/2013  . Demand ischemia ([place]) 01/02/2014  . Pulmonary hypertension [facility])    Past Surgical History   Procedure Laterality Date  . Right hip replacement      08/2006  . Cholecystectomy      01/2008  . Porta cath placement    . Porta cath removal    . Umbilical hernia repair      01/2008  . Excision of left periauricular cyst      10/2009  . Excision of right ear lobe cyst with primary closur      11/2007  . Portacath placement  01/05/2012    Procedure: INSERTION PORT-A-CATH;  Surgeon: Odis Hollingshead, MD;  Location: [place];  Service: General;  Laterality: N/A;  ultrasound guiced port a cath insertion with fluoroscopy   Family History  Problem Relation Age of Onset  . Sickle cell trait Mother   . Depression Mother   . Diabetes Mother   . Sickle cell trait Father   . Sickle cell trait Brother    Social History  Substance Use Topics  . Smoking status: Former Smoker -- 13 years  . Smokeless tobacco: Never Used  . Alcohol Use: No    Review of Systems  Cardiovascular: Positive for chest pain.      Allergies  Review of patient's allergies indicates no known allergies.  Home Medications   Prior to Admission medications   Medication Sig Start Date End Date Taking? Authorizing Provider  Cholecalciferol (VITAMIN D) 2000 UNITS tablet Take 1 tablet (  2,000 Units total) by mouth daily. 02/13/14  Yes [name], MD  enoxaparin (LOVENOX) 150 MG/ML injection Inject 0.73 mLs (110 mg total) into the skin daily. 09/26/14  Yes Micheline Chapman, NP  folic acid (FOLVITE) 1 MG tablet Take 1 tablet (1 mg total) by mouth every morning. 10/08/13  Yes [name], MD  HYDROmorphone (DILAUDID) 4 MG tablet Take 1 tablet (4 mg total) by mouth every 4 (four) hours as needed for severe pain. 11/06/14  Yes Dorena Dew, FNP  hydroxyurea (HYDREA) 500 MG capsule Take 1 capsule (500 mg total) by mouth daily. May take with food to minimize GI side effects. 11/06/14  Yes Dorena Dew, FNP  lisinopril (PRINIVIL,ZESTRIL) 10 MG tablet Take 1 tablet (10 mg total) by mouth daily. 01/22/14   Yes [name], MD  potassium chloride SA (K-DUR,KLOR-CON) 20 MEQ tablet Take 1 tablet (20 mEq total) by mouth every morning. 06/09/14  Yes [name], MD  zolpidem (AMBIEN) 10 MG tablet Take 1 tablet (10 mg total) by mouth at bedtime as needed for sleep. 10/23/14  Yes Tresa Garter, MD  aspirin 81 MG chewable tablet Chew 1 tablet (81 mg total) by mouth daily. 07/23/14   [name], MD  metoprolol succinate (TOPROL-XL) 50 MG 24 hr tablet Take 1 tablet (50 mg total) by mouth daily. 11/28/14   [name], MD  morphine (MS CONTIN) 30 MG 12 hr tablet Take 1 tablet (30 mg total) by mouth every 12 (twelve) hours. 11/18/14   Tresa Garter, MD   BP 124/89 mmHg  Pulse 67  Temp(Src) 97.7 F (36.5 C) (Oral)  Resp 20  Ht 6' (1.829 m)  Wt 165 lb 8 oz (75.07 kg)  BMI 22.44 kg/m2  SpO2 92% Physical Exam  Constitutional: He is oriented to person, place, and time. He appears well-developed and well-nourished.  HENT:  Head: Normocephalic and atraumatic.  Mouth/Throat: Oropharynx is clear and moist. No oropharyngeal exudate.  Eyes: Conjunctivae and EOM are normal. Pupils are equal, round, and reactive to light. Right eye exhibits no discharge. Left eye exhibits no discharge. Scleral icterus is present.  Neck: Normal range of motion. Neck supple.  Cardiovascular: Normal rate, regular rhythm, normal heart sounds and intact distal pulses.   Pulmonary/Chest: Effort normal and breath sounds normal. No respiratory distress. He has no wheezes. He has no rales. He exhibits no tenderness.  Abdominal: Soft. Bowel sounds are normal. He exhibits no distension and no mass. There is no tenderness. There is no rebound and no guarding.  Musculoskeletal: Normal range of motion. He exhibits no edema or tenderness.  Lymphadenopathy:    He has no cervical adenopathy.  Neurological: He is alert and oriented to person, place, and time.  Skin: Skin is warm and dry.  Nursing note and  vitals reviewed.   ED Course  Procedures (including critical care time) Labs Review Labs Reviewed  CBC WITH DIFFERENTIAL/PLATELET - Abnormal; Notable for the following:    WBC 16.8 (*)    RBC 2.32 (*)    Hemoglobin 7.4 (*)    HCT 21.2 (*)    RDW 20.9 (*)    Platelets 443 (*)    Neutro Abs 11.2 (*)    Monocytes Absolute 2.0 (*)    Basophils Absolute 0.2 (*)    All other components within normal limits  RETICULOCYTES - Abnormal; Notable for the following:    Retic Ct Pct >23.0 (*)    RBC.  2.32 (*)    All other components within normal limits  COMPREHENSIVE METABOLIC PANEL - Abnormal; Notable for the following:    Chloride 112 (*)    CO2 21 (*)    Creatinine, Ser 0.53 (*)    Total Bilirubin 8.4 (*)    All other components within normal limits  URINALYSIS, ROUTINE W REFLEX MICROSCOPIC (NOT AT [facility]) - Abnormal; Notable for the following:    Color, Urine AMBER (*)    Hgb urine dipstick TRACE (*)    Urobilinogen, UA 2.0 (*)    All other components within normal limits  COMPREHENSIVE METABOLIC PANEL - Abnormal; Notable for the following:    Chloride 112 (*)    CO2 21 (*)    Calcium 8.8 (*)    AST 42 (*)    Total Bilirubin 8.1 (*)    All other components within normal limits  LACTATE DEHYDROGENASE - Abnormal; Notable for the following:    LDH 433 (*)    All other components within normal limits  MAGNESIUM - Abnormal; Notable for the following:    Magnesium 1.5 (*)    All other components within normal limits  CBC WITH DIFFERENTIAL/PLATELET - Abnormal; Notable for the following:    WBC 16.6 (*)    RBC 2.34 (*)    Hemoglobin 7.5 (*)    HCT 21.6 (*)    RDW 21.2 (*)    Platelets 439 (*)    nRBC 3 (*)    Neutro Abs 11.2 (*)    Monocytes Absolute 2.0 (*)    Basophils Absolute 0.2 (*)    All other components within normal limits  RETICULOCYTES - Abnormal; Notable for the following:    Retic Ct Pct >23.0 (*)    RBC. 2.34 (*)    All other components within normal limits   CBC WITH DIFFERENTIAL/PLATELET - Abnormal; Notable for the following:    WBC 17.5 (*)    RBC 2.18 (*)    Hemoglobin 7.1 (*)    HCT 19.8 (*)    RDW 20.0 (*)    Platelets 408 (*)    Neutro Abs 11.5 (*)    Monocytes Absolute 1.9 (*)    Basophils Absolute 0.2 (*)    All other components within normal limits  BASIC METABOLIC PANEL - Abnormal; Notable for the following:    Chloride 115 (*)    CO2 19 (*)    Glucose, Bld 115 (*)    Calcium 8.6 (*)    All other components within normal limits  RETICULOCYTES - Abnormal; Notable for the following:    Retic Ct Pct >23.0 (*)    RBC. 2.18 (*)    All other components within normal limits  LACTATE DEHYDROGENASE - Abnormal; Notable for the following:    LDH 408 (*)    All other components within normal limits  CBC WITH DIFFERENTIAL/PLATELET - Abnormal; Notable for the following:    WBC 17.2 (*)    RBC 2.23 (*)    Hemoglobin 7.2 (*)    HCT 20.3 (*)    RDW 19.0 (*)    Platelets 404 (*)    Neutro Abs 10.6 (*)    Monocytes Absolute 2.4 (*)    All other components within normal limits  CBC WITH DIFFERENTIAL/PLATELET - Abnormal; Notable for the following:    WBC 15.4 (*)    RBC 2.28 (*)    Hemoglobin 7.4 (*)    HCT 20.5 (*)    MCHC 36.1 (*)  RDW 18.5 (*)    nRBC 8 (*)    Neutro Abs 10.6 (*)    Monocytes Absolute 1.7 (*)    Basophils Absolute 0.2 (*)    All other components within normal limits  MRSA PCR SCREENING  URINE MICROSCOPIC-ADD ON  TYPE AND SCREEN  PREPARE RBC (CROSSMATCH)  PREPARE RBC (CROSSMATCH)    Imaging Review Ct Angio Chest W/cm &/or Wo Cm  11/30/2014  CLINICAL DATA:  Shortness of breath. EXAM: CT ANGIOGRAPHY CHEST WITH CONTRAST TECHNIQUE: Multidetector CT imaging of the chest was performed using the standard protocol during bolus administration of intravenous contrast. Multiplanar CT image reconstructions and MIPs were obtained to evaluate the vascular anatomy. CONTRAST:  193mL OMNIPAQUE IOHEXOL 350 MG/ML SOLN  COMPARISON:  10/26/2014 FINDINGS: Mediastinum: Overall examination is suboptimal. Poor pulmonary arterial opacification and respiratory motion artifact diminishes exam detail. The heart size is moderately enlarged. No pericardial effusion. Prominent anterior mediastinal lymph nodes are again noted, unchanged from previous exam compatible with adenopathy. The trachea appears patent and is midline. Unremarkable appearance of the esophagus. Left upper lobe segmental pulmonary artery filling defect is identified, image 88 of series 14. Lungs/Pleura: No pleural effusion identified. There is atelectasis noted in both lung bases. No airspace consolidation. Upper Abdomen: The visualized portions of the liver are diffusely increased and attenuation. Calcified spleen noted. Musculoskeletal: Bony stigmata of sickle cell disease identified. Review of the MIP images confirms the above findings. IMPRESSION: 1. Exam detail diminished secondary to respiratory motion artifact and poor pulmonary arterial opacification. 2. Examination is positive for at least 1 segmental branch pulmonary artery filling defect compatible with acute pulmonary embolus. 3. Chronic changes of sickle cell disease. Electronically Signed   By: Kerby Moors M.D.   On: 11/30/2014 10:28   I have personally reviewed and evaluated these images and lab results as part of my medical decision-making.   EKG Interpretation   Date/Time:  Sunday November 23 2014 14:16:24 EDT Ventricular Rate:  87 PR Interval:  187 QRS Duration: 119 QT Interval:  415 QTC Calculation: 499 R Axis:   -75 Text Interpretation:  Sinus rhythm Paired ventricular premature complexes  Probable left atrial enlargement Incomplete right bundle branch block  Inferior infarct, age indeterminate Abnormal lateral Q waves Abnrm T,  consider ischemia, anterolateral lds Confirmed by COOK  MD, BRIAN ([id number])  on 11/23/2014 4:31:45 PM      MDM   Final diagnoses:  Sickle-cell disease  with pain ([place])    Patient presents with right rib pain, no relief with home pain meds. Denies fever. Patient was seen in the ED 2 days ago for similar pain, labs at baseline, pain improved in ED and pt was d/c home. VSS. Exam revealed scleral icterus, lungs CTAB, cardiac exam unremarkable, no chest well tenderness.   After 1st dose of pain meds pt reports pain has improved from 8/10 to 6/10. Labs at pt's baseline values. EKG unchanged. After 3rd dose of pain meds pt reports he is still having pain (7/10). Consulted hospitalist, Dr. Jonelle Sidle recommends to given pt one more dose of 2mg  dilaudid and states to reevaluate the pt's pain in 1 hours. He notes he will come see in the pt in the ED and if his pain has not improved after this dose of pain meds he will admit the pain for further pain management.  Hand-off to Eugene Gavia, PA-C.   Nona Dell, PA-C 11/23/14 [address] [place], [place] 11/30/14 1340  Dorie Rank, MD 01/09/15 1047

## 2014-11-23 NOTE — ED Notes (Addendum)
Patient c/o rib/chest pain since earlier this morning.  Patient took 4 mg of dilaudid this morning to treat pain with no relief.  Patient denies wheezing, new SOB (patient states he always become SOB with ambulation), coughing and dizziness.  Patient believes pain is related to [facility] and has had similar episodes in past.

## 2014-11-23 NOTE — ED Notes (Signed)
All items charted at [facility] were charted on wrong patient

## 2014-11-24 DIAGNOSIS — Z7901 Long term (current) use of anticoagulants: Secondary | ICD-10-CM

## 2014-11-24 DIAGNOSIS — D72829 Elevated white blood cell count, unspecified: Secondary | ICD-10-CM

## 2014-11-24 DIAGNOSIS — I272 Other secondary pulmonary hypertension: Secondary | ICD-10-CM

## 2014-11-24 DIAGNOSIS — D638 Anemia in other chronic diseases classified elsewhere: Secondary | ICD-10-CM

## 2014-11-24 LAB — MRSA PCR SCREENING: MRSA BY PCR: NEGATIVE

## 2014-11-24 MED ORDER — CHLORHEXIDINE GLUCONATE 0.12 % MT SOLN
15.0000 mL | Freq: Two times a day (BID) | OROMUCOSAL | Status: DC
  Administered 2014-11-24 – 2014-11-28 (×9): 15 mL via OROMUCOSAL
  Filled 2014-11-24 (×9): qty 15

## 2014-11-24 MED ORDER — DIPHENHYDRAMINE HCL 50 MG/ML IJ SOLN
25.0000 mg | INTRAMUSCULAR | Status: DC | PRN
  Administered 2014-11-24 – 2014-11-28 (×7): 25 mg via INTRAVENOUS
  Filled 2014-11-24 (×9): qty 0.5

## 2014-11-24 MED ORDER — HYDROMORPHONE HCL 2 MG/ML IJ SOLN
2.0000 mg | INTRAMUSCULAR | Status: DC | PRN
  Administered 2014-11-24 – 2014-11-25 (×4): 2 mg via INTRAVENOUS
  Filled 2014-11-24 (×4): qty 1

## 2014-11-24 MED ORDER — HYDROMORPHONE 1 MG/ML IV SOLN
INTRAVENOUS | Status: DC
  Administered 2014-11-24: 25 mg via INTRAVENOUS
  Administered 2014-11-24: 7.8 mg via INTRAVENOUS
  Administered 2014-11-24 (×2): 6.6 mg via INTRAVENOUS
  Administered 2014-11-25: 4.2 mg via INTRAVENOUS
  Administered 2014-11-25: 0.6 mg via INTRAVENOUS
  Administered 2014-11-25: 2.84 mg via INTRAVENOUS
  Administered 2014-11-25: 3 mg via INTRAVENOUS
  Filled 2014-11-24: qty 25

## 2014-11-24 MED ORDER — DIPHENHYDRAMINE HCL 25 MG PO CAPS
25.0000 mg | ORAL_CAPSULE | Freq: Four times a day (QID) | ORAL | Status: DC | PRN
  Filled 2014-11-24: qty 1

## 2014-11-24 NOTE — Progress Notes (Signed)
SICKLE CELL SERVICE PROGRESS NOTE  [name] TFT:[id number] DOB: [birth year] DOA: 11/23/2014 PCP: [name], MD  Assessment/Plan: Active Problems:   Pulmonary HTN ([place])   Hb-SS disease with crisis (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 Currently his pain is 7/10 compared to a baseline of 5-6/10. He has used 15.4 mg with 26/24: demands/deliveries. Will continue PCa at current dose, continue Toradol and adjust clinician assisted doses to q 3 hours as needed. 2. Anemia of chronic disease: Pt has a baseline Hb of 7-7.5. Currently he is at baseline. 3. Pulmonary Hypertension with Chronic hypoxic respiratory failure: Pt uses 2 l/min of Oxygen ATC at home. However he has been without his oxygen for about 2-3 days. I have reinforced the importance of wearing oxygen continuously. I will ask CM to arrange for replacement Oxygen. 4. 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 No requirements for SCD's thus SCD's discontinued. 5. Leukocytosis: Pt has no evidence of infection. Likely related to crisis and increased bone marrow activity. Will monitor. 6. Chronic pain: Pt reports that his current regimen of pain management does not seem to be effective and he has had to use more medication to control his pain. He is currently on MS Contin 30 mg q 12 hours. Will defer to Primary Care Physician to direct management of chronic pain. 7. Hypertension: BP well controlled. Continue Lisinopril   Code Status: Full Code Family Communication: N/A Disposition Plan: Not yet ready for discharge  [place].  Pager [phone]. If 7PM-7AM, please contact night-coverage.  11/24/2014, 10:37 AM  LOS: 1 day    Consultants:  None  Procedures:  None  Antibiotics:  None  HPI/Subjective: Pt reports that pain in ribs  at an intensity of 7/10. Lasty BM last night.  Objective: Filed Vitals:   11/24/14 0007 11/24/14 0319 11/24/14 0455 11/24/14 0748  BP: 137/96  120/80   Pulse: 100  75   Temp: 98.4 F (36.9 C)  98.4 F (36.9 C)   TempSrc: Oral  Oral   Resp: 22 17 15 19   SpO2: 90% 92% 90% 94%   Weight change:   Intake/Output Summary (Last 24 hours) at 11/24/14 1037 Last data filed at 11/24/14 1000  Gross per 24 hour  Intake 1039.1 ml  Output    290 ml  Net  749.1 ml    General: Alert, awake, oriented x3, in no acute distress.  HEENT: [place]/AT PEERL, EOMI, mild icterus Neck: Trachea midline,  no masses, no thyromegal,y no JVD, no carotid bruit OROPHARYNX:  Moist, No exudate/ erythema/lesions. geographic tongue. Heart: Regular rate and rhythm, without murmurs, rubs, gallops.  Lungs: Clear to auscultation, no wheezing or rhonchi noted. Abdomen: Soft, nontender, nondistended, positive bowel sounds, no masses no hepatosplenomegaly noted.  Neuro: No focal neurological deficits noted cranial nerves II through XII grossly intact.  Strength at functional baseline in bilateral upper and lower extremities. Musculoskeletal: No warm swelling or erythema around joints, no spinal tenderness noted. Psychiatric: Patient alert and oriented x3, good insight and cognition, good recent to remote recall.    Data Reviewed: Basic Metabolic Panel:  Recent Labs Lab 11/17/14 1245 11/22/14 0057 11/23/14 1327 11/23/14 2020  NA 138 137 138 140  K 3.5 3.6 3.5 3.5  CL 108 109 112* 112*  CO2 23 22 21*  21*  GLUCOSE 123* 112* 91 94  BUN 8 9 8 9   CREATININE 0.65 0.67 0.53* 0.76  CALCIUM 9.0 8.7* 8.9 8.8*  MG  --   --   --  1.5*   Liver Function Tests:  Recent Labs Lab 11/17/14 1245 11/22/14 0057 11/23/14 1327 11/23/14 2020  AST 39 41 39 42*  ALT 32 25 23 22   ALKPHOS 83 88 70 68  BILITOT 6.2* 7.9* 8.4* 8.1*  PROT 8.1 7.6 7.2 7.4  ALBUMIN 4.2 4.1 3.9 4.1   No results for input(s): LIPASE, AMYLASE in the  last 168 hours. No results for input(s): AMMONIA in the last 168 hours. CBC:  Recent Labs Lab 11/17/14 1245 11/22/14 0057 11/23/14 1327 11/23/14 2020  WBC 21.1* 17.6* 16.8* 16.6*  NEUTROABS 14.8* 11.4* 11.2* 11.2*  HGB 8.1* 8.0* 7.4* 7.5*  HCT 24.3* 22.3* 21.2* 21.6*  MCV 93.8 92.5 91.4 92.3  PLT 484* 440* 443* 439*   Cardiac Enzymes: No results for input(s): CKTOTAL, CKMB, CKMBINDEX, TROPONINI in the last 168 hours. BNP (last 3 results)  Recent Labs  08/29/14 0810 09/10/14 0612  BNP 1117.0* 549.5*    ProBNP (last 3 results)  Recent Labs  01/01/14 0909  PROBNP 5790.0*    CBG: No results for input(s): GLUCAP in the last 168 hours.  No results found for this or any previous visit (from the past 240 hour(s)).   Studies: Dg Chest 2 View  11/15/2014  CLINICAL DATA:  Back pain.  Sickle cell anemia. EXAM: CHEST  2 VIEW COMPARISON:  11/13/2014 FINDINGS: Cardiomegaly. Left subclavian vein Port-A-Cath is stable. No pneumothorax. Chronic changes at the lung bases. IMPRESSION: No active cardiopulmonary disease. Electronically Signed   By: Marybelle Killings M.D.   On: 11/15/2014 08:52   Dg Chest 2 View  11/13/2014  CLINICAL DATA:  Sickle cell pain crisis with left lateral chest wall pain EXAM: CHEST  2 VIEW COMPARISON:  10/26/2014 FINDINGS: Chronic cardiomegaly. Negative aortic and hilar contours. Stable left subclavian porta catheter placement, tip at the upper right atrium. Chronic interstitial coarsening consistent with scar. There is no edema, consolidation, effusion, or pneumothorax. No osseous findings to explain acute pain. Chronic humeral head osteonecrosis. Cholecystectomy. IMPRESSION: Stable lung scarring and cardiomegaly. No evidence of acute cardiopulmonary disease. Electronically Signed   By: Monte Fantasia M.D.   On: 11/13/2014 08:15   Dg Chest 2 View  10/26/2014  CLINICAL DATA:  Chronic sickle cell pain EXAM: CHEST  2 VIEW COMPARISON:  04/12/2014 FINDINGS:  Cardiomegaly.  No frank interstitial edema. Mild patchy right basilar/right lower lobe opacity, atelectasis versus pneumonia. No pleural effusion or pneumothorax. Left chest power port terminating at the cavoatrial junction. IMPRESSION: Mild patchy right lower lobe opacity, atelectasis versus pneumonia. Cardiomegaly.  No frank interstitial edema. Electronically Signed   By: Julian Hy M.D.   On: 10/26/2014 07:40   Dg Lumbar Spine Complete  11/15/2014  CLINICAL DATA:  Sickle cell pain crisis EXAM: LUMBAR SPINE - COMPLETE 4+ VIEW COMPARISON:  None. FINDINGS: Five lumbar type vertebral bodies. Straightening of the lumbar spine. No evidence of fracture or dislocation. Vertebral body heights are maintained. Mild superior/ inferior endplate changes at L5 characteristic of sickle cell. Visualized bony pelvis appears intact. IMPRESSION: No fracture or dislocation is seen. Electronically Signed   By: Julian Hy M.D.   On: 11/15/2014 11:22   Dg Pelvis 1-2 Views  11/15/2014  CLINICAL DATA:  Sickle cell pain crisis EXAM: PELVIS - 1-2 VIEW COMPARISON:  CT  right hip dated 10/27/2013 FINDINGS: Right hip arthroplasty in satisfactory position. No fracture or dislocation is seen. Left hip joint space is preserved. Visualized bony pelvis appears intact. IMPRESSION: Right hip arthroplasty satisfactory position. No fracture or dislocation is seen. Electronically Signed   By: Julian Hy M.D.   On: 11/15/2014 11:19   Ct Angio Chest Pe W/cm &/or Wo Cm  11/13/2014  CLINICAL DATA:  Chest pain.  Sickle cell disease EXAM: CT ANGIOGRAPHY CHEST WITH CONTRAST TECHNIQUE: Multidetector CT imaging of the chest was performed using the standard protocol during bolus administration of intravenous contrast. Multiplanar CT image reconstructions and MIPs were obtained to evaluate the vascular anatomy. CONTRAST:  160mL OMNIPAQUE IOHEXOL 350 MG/ML SOLN COMPARISON:  Chest CT October 26, 2014 and November 13, 2014 chest  radiograph FINDINGS: There is a small pulmonary embolus in the posterior segment right lower lobe pulmonary artery. No more central pulmonary emboli are identified. No evidence of right heart strain. There is no thoracic aortic aneurysm or dissection. The visualized great vessels appear normal. There is a small area of patchy infiltrate in the anterior segment of the left upper lobe, stable. There is patchy atelectatic change in both bases, stable. No new opacity is appreciable. Cardiomegaly is stable.  The pericardium is not thickened. Visualized thyroid appears normal. Anterior mediastinal adenopathy remains, stable. The largest individual lymph node in the anterior mediastinum measures 2.3 x 1.5 cm, unchanged. There is a single prominent lymph node in the right hilum measuring 1.6 x 1.2 cm. There is no new adenopathy. Central catheter tip is near the cavoatrial junction. In the visualized upper abdomen, there is diffuse calcification of the spleen consistent with a degree of autoinfarction, stable. 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 Bones overall are sclerotic consistent with the chronic anemia. There is a probable sebaceous cyst measuring 2.1 x 1.1 cm in the right anterior chest wall, stable. Review of the MIP images confirms the above findings. IMPRESSION: Small pulmonary embolus in a posterior segment right lower lobe pulmonary artery branch, not present recently and presumed new. No other pulmonary emboli identified. Stable cardiomegaly and anterior mediastinal adenopathy. Single prominent lymph node in the right hilum. Stable areas of patchy lower lobe atelectasis and mild patchy infiltrate in the left upper lobe. No new parenchymal lung opacity. 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 prior splenic autoinfarction. Critical Value/emergent results were called by telephone at the time of interpretation on 11/13/2014 at  12:32 pm to Dr. Joseph Berkshire , who verbally acknowledged these results. Electronically Signed   By: Lowella Grip III M.D.   On: 11/13/2014 12:34   Ct Angio Chest Pe W/cm &/or Wo Cm  10/26/2014  CLINICAL DATA:  Sickle cell crisis.  Chest pain.  Low O2 sats. EXAM: CT ANGIOGRAPHY CHEST WITH CONTRAST TECHNIQUE: Multidetector CT imaging of the chest was performed using the standard protocol during bolus administration of intravenous contrast. Multiplanar CT image reconstructions and MIPs were obtained to evaluate the vascular anatomy. CONTRAST:  129mL OMNIPAQUE IOHEXOL 350 MG/ML SOLN COMPARISON:  09/10/2014.  Chest x-ray earlier today. FINDINGS: There is cardiomegaly. Aorta is normal caliber. Mildly enlarged anterior mediastinal/prevascular lymph nodes. Index node has a short axis diameter of 11 mm, stable. No hilar or axillary adenopathy. Previously seen lower lobe pulmonary emboli not visualized on today's study. For no confluent airspace opacity right that linear areas of scarring in the lung bases. No effusions or confluent airspace opacities.  Left chest wall Port-A-Cath remains in place, unchanged. Chest wall soft tissues otherwise unremarkable. Imaging into the upper abdomen shows no acute findings. 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 No acute bony abnormality or focal bone lesion. Review of the MIP images confirms the above findings. IMPRESSION: Stable cardiomegaly and anterior mediastinal adenopathy. Previously seen pulmonary emboli no longer visualized. Scarring in the lung bases. No acute findings. Electronically Signed   By: Rolm Baptise M.D.   On: 10/26/2014 09:54    Scheduled Meds: . chlorhexidine  15 mL Mouth Rinse BID  . cholecalciferol  2,000 Units Oral Daily  . diltiazem  120 mg Oral Daily  . enoxaparin  110 mg Subcutaneous Q24H  . folic acid  1 mg Oral q morning - 10a  . HYDROmorphone   Intravenous 6 times per day  . hydroxyurea  500 mg  Oral Daily  . ketorolac  30 mg Intravenous 4 times per day  . lisinopril  10 mg Oral Daily  . metoprolol succinate  25 mg Oral Daily  . morphine  30 mg Oral Q12H  . potassium chloride SA  20 mEq Oral q morning - 10a  . senna-docusate  1 tablet Oral BID   Continuous Infusions: . sodium chloride 50 mL/hr at 11/23/14 2008    Time spent 40 minutes.

## 2014-11-25 ENCOUNTER — Inpatient Hospital Stay (HOSPITAL_COMMUNITY): Payer: Medicare Other

## 2014-11-25 DIAGNOSIS — J9621 Acute and chronic respiratory failure with hypoxia: Secondary | ICD-10-CM

## 2014-11-25 DIAGNOSIS — G894 Chronic pain syndrome: Secondary | ICD-10-CM

## 2014-11-25 LAB — RETICULOCYTES: RBC.: 2.18 MIL/uL — AB (ref 4.22–5.81)

## 2014-11-25 LAB — BASIC METABOLIC PANEL
Anion gap: 6 (ref 5–15)
BUN: 11 mg/dL (ref 6–20)
CALCIUM: 8.6 mg/dL — AB (ref 8.9–10.3)
CO2: 19 mmol/L — ABNORMAL LOW (ref 22–32)
CREATININE: 0.67 mg/dL (ref 0.61–1.24)
Chloride: 115 mmol/L — ABNORMAL HIGH (ref 101–111)
Glucose, Bld: 115 mg/dL — ABNORMAL HIGH (ref 65–99)
Potassium: 3.7 mmol/L (ref 3.5–5.1)
SODIUM: 140 mmol/L (ref 135–145)

## 2014-11-25 LAB — CBC WITH DIFFERENTIAL/PLATELET
BASOS ABS: 0.2 10*3/uL — AB (ref 0.0–0.1)
Basophils Relative: 1 %
EOS ABS: 0.7 10*3/uL (ref 0.0–0.7)
Eosinophils Relative: 4 %
HCT: 19.8 % — ABNORMAL LOW (ref 39.0–52.0)
HEMOGLOBIN: 7.1 g/dL — AB (ref 13.0–17.0)
LYMPHS ABS: 3.2 10*3/uL (ref 0.7–4.0)
LYMPHS PCT: 18 %
MCH: 32.6 pg (ref 26.0–34.0)
MCHC: 35.9 g/dL (ref 30.0–36.0)
MCV: 90.8 fL (ref 78.0–100.0)
MONO ABS: 1.9 10*3/uL — AB (ref 0.1–1.0)
Monocytes Relative: 11 %
NEUTROS ABS: 11.5 10*3/uL — AB (ref 1.7–7.7)
Neutrophils Relative %: 66 %
PLATELETS: 408 10*3/uL — AB (ref 150–400)
RBC: 2.18 MIL/uL — ABNORMAL LOW (ref 4.22–5.81)
RDW: 20 % — AB (ref 11.5–15.5)
WBC: 17.5 10*3/uL — ABNORMAL HIGH (ref 4.0–10.5)

## 2014-11-25 LAB — LACTATE DEHYDROGENASE: LDH: 408 U/L — ABNORMAL HIGH (ref 98–192)

## 2014-11-25 MED ORDER — HYDROMORPHONE HCL 4 MG PO TABS
4.0000 mg | ORAL_TABLET | ORAL | Status: DC
  Administered 2014-11-25 – 2014-11-26 (×6): 4 mg via ORAL
  Filled 2014-11-25 (×6): qty 1

## 2014-11-25 MED ORDER — HYDROMORPHONE 1 MG/ML IV SOLN
INTRAVENOUS | Status: DC
  Administered 2014-11-25: 20:00:00 via INTRAVENOUS
  Administered 2014-11-25: 4 mg via INTRAVENOUS
  Administered 2014-11-25: 1 mg via INTRAVENOUS
  Administered 2014-11-25: 5.4 mg via INTRAVENOUS
  Administered 2014-11-26: 4 mg via INTRAVENOUS
  Administered 2014-11-26: 2.5 mg via INTRAVENOUS
  Administered 2014-11-26: 5.5 mg via INTRAVENOUS
  Administered 2014-11-26: 3.5 mg via INTRAVENOUS
  Administered 2014-11-26: 0.5 mg via INTRAVENOUS
  Administered 2014-11-26: 0 mg via INTRAVENOUS
  Administered 2014-11-27: 2.5 mg via INTRAVENOUS
  Administered 2014-11-27: 6.5 mg via INTRAVENOUS
  Administered 2014-11-27: 3.5 mg via INTRAVENOUS
  Administered 2014-11-27: 5.5 mg via INTRAVENOUS
  Administered 2014-11-27: 01:00:00 via INTRAVENOUS
  Administered 2014-11-27: 0 mg via INTRAVENOUS
  Administered 2014-11-28: 4 mg via INTRAVENOUS
  Administered 2014-11-28: 6 mg via INTRAVENOUS
  Administered 2014-11-28: 05:00:00 via INTRAVENOUS
  Administered 2014-11-28: 2 mg via INTRAVENOUS
  Filled 2014-11-25 (×3): qty 25

## 2014-11-25 MED ORDER — HYDROXYUREA 500 MG PO CAPS
1000.0000 mg | ORAL_CAPSULE | Freq: Every day | ORAL | Status: DC
  Administered 2014-11-26 – 2014-11-28 (×3): 1000 mg via ORAL
  Filled 2014-11-25 (×3): qty 2

## 2014-11-25 MED ORDER — DEXTROSE-NACL 5-0.45 % IV SOLN
INTRAVENOUS | Status: DC
  Administered 2014-11-25: 15:00:00 via INTRAVENOUS

## 2014-11-25 NOTE — Progress Notes (Signed)
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 Hb SS with crisis: Pt reports that he feels better. He has used 19.64 mg with 44/33: demands/deliveries. Will continue scheduled oral Dilaudid and decrease PCA dose for PRN use. Anticipate discharge home tomorrow. 2. Acute on chronic hypoxic respiratory failure: Pt had an increased oxygen requirement in the last 24 hours. This may be in part contributed to by effects of opiates. I have decreased PCA dose and encouraged use of ICS. Hb at baseline. If no improvement in next 12-24 hours will obtain an CXR. 3. Anemia of chronic disease: Pt has a baseline Hb of 7-7.5. Currently he is at baseline. 4. Pulmonary Hypertension with Chronic hypoxic respiratory failure: Pt uses 2-3 l/min of Oxygen ATC at home. However he has been without his oxygen for about 2-3 days. I have reinforced the importance of wearing oxygen continuously. I will ask CM to arrange for replacement Oxygen. 5. 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 No requirements for SCD's thus SCD's discontinued. 6. Leukocytosis: Pt has no evidence of infection. Likely related to crisis and increased bone marrow activity. Will monitor. 7. Chronic pain: Pt reports that his current regimen of pain management does not seem to be effective and he has had to use more medication to control his pain. He is currently on MS Contin 30 mg q 12 hours. Will defer to Primary Care Physician to direct management of chronic pain. 8. Hypertension: BP well controlled. Continue Lisinopril   Code Status: Full Code Family Communication: N/A Disposition Plan: Not yet ready for discharge  [place].  Pager [phone].  If 7PM-7AM, please contact night-coverage.  11/25/2014, 1:48 PM  LOS: 2 days    Consultants:  None  Procedures:  None  Antibiotics:  None  HPI/Subjective: Pt reports that pain in ribs at an intensity of 6/10. Lasty BM last night.  Objective: Filed Vitals:   11/25/14 0852 11/25/14 0939 11/25/14 1130 11/25/14 1315  BP:  140/88  138/94  Pulse:  101  93  Temp:  97.5 F (36.4 C)  97.6 F (36.4 C)  TempSrc:  Oral  Oral  Resp: 16 15 17 17   Height:      Weight:      SpO2: 92% 91% 92% 96%   Weight change:   Intake/Output Summary (Last 24 hours) at 11/25/14 1348 Last data filed at 11/24/14 1353  Gross per 24 hour  Intake    240 ml  Output    200 ml  Net     40 ml    General: Alert, awake, oriented x3, in no acute distress.  HEENT: [place]/AT PEERL, EOMI, mild icterus Neck: Trachea midline,  no masses, no thyromegal,y no JVD, no carotid bruit OROPHARYNX:  Moist, No exudate/ erythema/lesions. geographic tongue. Heart: Regular rate and rhythm, without murmurs, rubs, gallops.  Lungs: Clear to auscultation, no wheezing or rhonchi noted. Abdomen: Soft, nontender, nondistended, positive bowel sounds, no masses no hepatosplenomegaly noted.  Neuro: No focal neurological deficits noted cranial nerves II through XII grossly intact.  Strength at functional baseline in bilateral upper and lower extremities. Musculoskeletal: No warm swelling or erythema around joints, no spinal tenderness noted. Psychiatric: Patient alert and oriented x3, good insight  and cognition, good recent to remote recall.    Data Reviewed: Basic Metabolic Panel:  Recent Labs Lab 11/22/14 0057 11/23/14 1327 11/23/14 2020 11/25/14 1030  NA 137 138 140 140  K 3.6 3.5 3.5 3.7  CL 109 112* 112* 115*  CO2 22 21* 21* 19*  GLUCOSE 112* 91 94 115*  BUN 9 8 9 11   CREATININE 0.67 0.53* 0.76 0.67  CALCIUM 8.7* 8.9 8.8* 8.6*  MG  --   --  1.5*  --    Liver Function Tests:  Recent Labs Lab 11/22/14 0057  11/23/14 1327 11/23/14 2020  AST 41 39 42*  ALT 25 23 22   ALKPHOS 88 70 68  BILITOT 7.9* 8.4* 8.1*  PROT 7.6 7.2 7.4  ALBUMIN 4.1 3.9 4.1   No results for input(s): LIPASE, AMYLASE in the last 168 hours. No results for input(s): AMMONIA in the last 168 hours. CBC:  Recent Labs Lab 11/22/14 0057 11/23/14 1327 11/23/14 2020 11/25/14 1030  WBC 17.6* 16.8* 16.6* 17.5*  NEUTROABS 11.4* 11.2* 11.2* 11.5*  HGB 8.0* 7.4* 7.5* 7.1*  HCT 22.3* 21.2* 21.6* 19.8*  MCV 92.5 91.4 92.3 90.8  PLT 440* 443* 439* 408*   Cardiac Enzymes: No results for input(s): CKTOTAL, CKMB, CKMBINDEX, TROPONINI in the last 168 hours. BNP (last 3 results)  Recent Labs  08/29/14 0810 09/10/14 0612  BNP 1117.0* 549.5*    ProBNP (last 3 results)  Recent Labs  01/01/14 0909  PROBNP 5790.0*    CBG: No results for input(s): GLUCAP in the last 168 hours.  Recent Results (from the past 240 hour(s))  MRSA PCR Screening     Status: None   Collection Time: 11/24/14  4:39 PM  Result Value Ref Range Status   MRSA by PCR NEGATIVE NEGATIVE Final    Comment:        The GeneXpert MRSA Assay (FDA approved for NASAL specimens only), is one component of a comprehensive MRSA colonization surveillance program. It is not intended to diagnose MRSA infection nor to guide or monitor treatment for MRSA infections.      Studies: Dg Chest 2 View  11/15/2014  CLINICAL DATA:  Back pain.  Sickle cell anemia. EXAM: CHEST  2 VIEW COMPARISON:  11/13/2014 FINDINGS: Cardiomegaly. Left subclavian vein Port-A-Cath is stable. No pneumothorax. Chronic changes at the lung bases. IMPRESSION: No active cardiopulmonary disease. Electronically Signed   By: Marybelle Killings M.D.   On: 11/15/2014 08:52   Dg Chest 2 View  11/13/2014  CLINICAL DATA:  Sickle cell pain crisis with left lateral chest wall pain EXAM: CHEST  2 VIEW COMPARISON:  10/26/2014 FINDINGS: Chronic cardiomegaly. Negative aortic and hilar contours. Stable left  subclavian porta catheter placement, tip at the upper right atrium. Chronic interstitial coarsening consistent with scar. There is no edema, consolidation, effusion, or pneumothorax. No osseous findings to explain acute pain. Chronic humeral head osteonecrosis. Cholecystectomy. IMPRESSION: Stable lung scarring and cardiomegaly. No evidence of acute cardiopulmonary disease. Electronically Signed   By: Monte Fantasia M.D.   On: 11/13/2014 08:15   Dg Lumbar Spine Complete  11/15/2014  CLINICAL DATA:  Sickle cell pain crisis EXAM: LUMBAR SPINE - COMPLETE 4+ VIEW COMPARISON:  None. FINDINGS: Five lumbar type vertebral bodies. Straightening of the lumbar spine. No evidence of fracture or dislocation. Vertebral body heights are maintained. Mild superior/ inferior endplate changes at L5 characteristic of sickle cell. Visualized bony pelvis appears intact. IMPRESSION: No fracture or dislocation is seen. Electronically Signed   By:  Julian Hy M.D.   On: 11/15/2014 11:22   Dg Pelvis 1-2 Views  11/15/2014  CLINICAL DATA:  Sickle cell pain crisis EXAM: PELVIS - 1-2 VIEW COMPARISON:  CT right hip dated 10/27/2013 FINDINGS: Right hip arthroplasty in satisfactory position. No fracture or dislocation is seen. Left hip joint space is preserved. Visualized bony pelvis appears intact. IMPRESSION: Right hip arthroplasty satisfactory position. No fracture or dislocation is seen. Electronically Signed   By: Julian Hy M.D.   On: 11/15/2014 11:19   Ct Angio Chest Pe W/cm &/or Wo Cm  11/13/2014  CLINICAL DATA:  Chest pain.  Sickle cell disease EXAM: CT ANGIOGRAPHY CHEST WITH CONTRAST TECHNIQUE: Multidetector CT imaging of the chest was performed using the standard protocol during bolus administration of intravenous contrast. Multiplanar CT image reconstructions and MIPs were obtained to evaluate the vascular anatomy. CONTRAST:  154mL OMNIPAQUE IOHEXOL 350 MG/ML SOLN COMPARISON:  Chest CT October 26, 2014 and  November 13, 2014 chest radiograph FINDINGS: There is a small pulmonary embolus in the posterior segment right lower lobe pulmonary artery. No more central pulmonary emboli are identified. No evidence of right heart strain. There is no thoracic aortic aneurysm or dissection. The visualized great vessels appear normal. There is a small area of patchy infiltrate in the anterior segment of the left upper lobe, stable. There is patchy atelectatic change in both bases, stable. No new opacity is appreciable. Cardiomegaly is stable.  The pericardium is not thickened. Visualized thyroid appears normal. Anterior mediastinal adenopathy remains, stable. The largest individual lymph node in the anterior mediastinum measures 2.3 x 1.5 cm, unchanged. There is a single prominent lymph node in the right hilum measuring 1.6 x 1.2 cm. There is no new adenopathy. Central catheter tip is near the cavoatrial junction. In the visualized upper abdomen, there is diffuse calcification of the spleen consistent with a degree of autoinfarction, stable. 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 Bones overall are sclerotic consistent with the chronic anemia. There is a probable sebaceous cyst measuring 2.1 x 1.1 cm in the right anterior chest wall, stable. Review of the MIP images confirms the above findings. IMPRESSION: Small pulmonary embolus in a posterior segment right lower lobe pulmonary artery branch, not present recently and presumed new. No other pulmonary emboli identified. Stable cardiomegaly and anterior mediastinal adenopathy. Single prominent lymph node in the right hilum. Stable areas of patchy lower lobe atelectasis and mild patchy infiltrate in the left upper lobe. No new parenchymal lung opacity. 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 prior splenic autoinfarction. Critical Value/emergent results were called by telephone at the time of  interpretation on 11/13/2014 at 12:32 pm to Dr. Joseph Berkshire , who verbally acknowledged these results. Electronically Signed   By: Lowella Grip III M.D.   On: 11/13/2014 12:34   Dg Chest Port 1 View  11/25/2014  CLINICAL DATA:  Current history of sickle cell disease, presenting with hypoxia. Recent diagnosis of pulmonary embolus. EXAM: PORTABLE CHEST 1 VIEW COMPARISON:  Chest x-ray 11/15/2014 and earlier. CTA chest 11/13/2014. CT chest 10/26/2014 and earlier. FINDINGS: Cardiac silhouette massively enlarged, unchanged. Widening of the superior mediastinum, unchanged, shown on prior CT to be due to anterior mediastinal lymphadenopathy. Stable scarring in the lung bases. Lungs otherwise clear. No localized airspace consolidation. No pleural effusions. No pneumothorax. Normal pulmonary vascularity. Left subclavian Port-A-Cath tip projects at the cavoatrial junction, unchanged. IMPRESSION: Stable massive cardiomegaly.  No acute cardiopulmonary  disease. Electronically Signed   By: Evangeline Dakin M.D.   On: 11/25/2014 10:01    Scheduled Meds: . chlorhexidine  15 mL Mouth Rinse BID  . cholecalciferol  2,000 Units Oral Daily  . diltiazem  120 mg Oral Daily  . enoxaparin  110 mg Subcutaneous Q24H  . folic acid  1 mg Oral q morning - 10a  . HYDROmorphone   Intravenous 6 times per day  . HYDROmorphone  4 mg Oral Q4H  . [START ON 11/26/2014] hydroxyurea  1,000 mg Oral Daily  . ketorolac  30 mg Intravenous 4 times per day  . lisinopril  10 mg Oral Daily  . metoprolol succinate  25 mg Oral Daily  . morphine  30 mg Oral Q12H  . potassium chloride SA  20 mEq Oral q morning - 10a  . senna-docusate  1 tablet Oral BID   Continuous Infusions: . dextrose 5 % and 0.45% NaCl      Time spent 32 minutes.

## 2014-11-26 ENCOUNTER — Inpatient Hospital Stay (HOSPITAL_COMMUNITY): Payer: Medicare Other

## 2014-11-26 LAB — CBC WITH DIFFERENTIAL/PLATELET
BASOS PCT: 0 %
Basophils Absolute: 0.1 10*3/uL (ref 0.0–0.1)
EOS ABS: 0.7 10*3/uL (ref 0.0–0.7)
Eosinophils Relative: 4 %
HCT: 20.3 % — ABNORMAL LOW (ref 39.0–52.0)
HEMOGLOBIN: 7.2 g/dL — AB (ref 13.0–17.0)
Lymphocytes Relative: 19 %
Lymphs Abs: 3.3 10*3/uL (ref 0.7–4.0)
MCH: 32.3 pg (ref 26.0–34.0)
MCHC: 35.5 g/dL (ref 30.0–36.0)
MCV: 91 fL (ref 78.0–100.0)
MONOS PCT: 14 %
Monocytes Absolute: 2.4 10*3/uL — ABNORMAL HIGH (ref 0.1–1.0)
NEUTROS PCT: 63 %
Neutro Abs: 10.6 10*3/uL — ABNORMAL HIGH (ref 1.7–7.7)
Platelets: 404 10*3/uL — ABNORMAL HIGH (ref 150–400)
RBC: 2.23 MIL/uL — ABNORMAL LOW (ref 4.22–5.81)
RDW: 19 % — AB (ref 11.5–15.5)
WBC: 17.2 10*3/uL — ABNORMAL HIGH (ref 4.0–10.5)

## 2014-11-26 LAB — PREPARE RBC (CROSSMATCH)

## 2014-11-26 MED ORDER — HYDROMORPHONE HCL 4 MG PO TABS
4.0000 mg | ORAL_TABLET | ORAL | Status: DC | PRN
  Administered 2014-11-27: 4 mg via ORAL
  Filled 2014-11-26: qty 1

## 2014-11-26 MED ORDER — SODIUM CHLORIDE 0.9 % IV SOLN: Freq: Once | INTRAVENOUS | Status: AC

## 2014-11-26 NOTE — Progress Notes (Signed)
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)  1. Hypoxemia: Pt continues to require 6 l/min of Oxygen to keep saturations > 92%. Will obtain 2 view CXR to evaluate for infiltrates.  2. Hb SS with crisis: Pain essentiailly controlled on PCA settings and MS Contin. Will continue for now. Continue Toradol  3. Anemia of chronic disease: Pt has a baseline Hb of 7-7.5. Currently he is at baseline. 4. Pulmonary Hypertension with Chronic hypoxic respiratory failure: Pt uses 2-3 l/min of Oxygen ATC at home. However he has been without his oxygen for about 2-3 days. I have reinforced the importance of wearing oxygen continuously. I will ask CM to arrange for replacement Oxygen. 5. 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 No requirements for SCD's thus SCD's discontinued. 6. Leukocytosis: Pt has no evidence of infection. Likely related to crisis and increased bone marrow activity. Will monitor. 7. Chronic pain: Pt reports that his current regimen of pain management does not seem to be effective and he has had to use more medication to control his pain. He is currently on MS Contin 30 mg q 12 hours. Will defer to Primary Care Physician to direct management of chronic pain. 8. Hypertension: BP well controlled. Continue Lisinopril   Code Status: Full Code Family Communication: N/A Disposition Plan: Not yet ready for discharge  [facility].  Pager [phone]. If 7PM-7AM, please contact night-coverage.  11/26/2014, 1:54 PM  LOS: 3 days    Consultants:  None  Procedures:  None  Antibiotics:  None  HPI/Subjective: Pt reports that pain in ribs at an intensity of 5-6/10. Last BM l2 days  ago.  Objective: Filed Vitals:   11/26/14 0833 11/26/14 0929 11/26/14 1213 11/26/14 1255  BP:  126/91    Pulse:  95    Temp:      TempSrc:  Oral    Resp:  22 17   Height:      Weight:      SpO2: 91% 91% 91% 71%   Weight change: 6.4 oz (0.181 kg)  Intake/Output Summary (Last 24 hours) at 11/26/14 1354 Last data filed at 11/26/14 0930  Gross per 24 hour  Intake 1429.33 ml  Output    800 ml  Net 629.33 ml    General: Alert, awake, oriented x3, in no acute distress.  HEENT: [place]/AT PEERL, EOMI, mild icterus Neck: Trachea midline,  no masses, no thyromegal,y no JVD, no carotid bruit OROPHARYNX:  Moist, No exudate/ erythema/lesions. geographic tongue. Heart: Regular rate and rhythm, without murmurs, rubs, gallops.  Lungs: Clear to auscultation, no wheezing or rhonchi noted. Abdomen: Soft, nontender, nondistended, positive bowel sounds, no masses no hepatosplenomegaly noted.  Neuro: No focal neurological deficits noted cranial nerves II through XII grossly intact.  Strength at functional baseline in bilateral upper and lower extremities. Musculoskeletal: No warm swelling or erythema around joints, no spinal tenderness noted. Psychiatric: Patient alert and oriented x3, good insight and cognition, good recent to remote recall.    Data Reviewed: Basic Metabolic Panel:  Recent Labs Lab 11/22/14 0057 11/23/14 1327 11/23/14 2020 11/25/14 1030  NA 137 138 140 140  K 3.6 3.5 3.5 3.7  CL 109 112* 112* 115*  CO2 22 21* 21* 19*  GLUCOSE 112* 91 94 115*  BUN 9 8 9 11   CREATININE 0.67 0.53* 0.76 0.67  CALCIUM 8.7* 8.9 8.8* 8.6*  MG  --   --  1.5*  --    Liver Function Tests:  Recent Labs Lab 11/22/14 0057 11/23/14 1327 11/23/14 2020  AST 41 39 42*  ALT 25 23 22   ALKPHOS 88 70 68  BILITOT 7.9* 8.4* 8.1*  PROT 7.6 7.2 7.4  ALBUMIN 4.1 3.9 4.1   No results for input(s): LIPASE, AMYLASE in the last 168 hours. No results for input(s): AMMONIA in the last 168  hours. CBC:  Recent Labs Lab 11/22/14 0057 11/23/14 1327 11/23/14 2020 11/25/14 1030 11/26/14 1100  WBC 17.6* 16.8* 16.6* 17.5* 17.2*  NEUTROABS 11.4* 11.2* 11.2* 11.5* 10.6*  HGB 8.0* 7.4* 7.5* 7.1* 7.2*  HCT 22.3* 21.2* 21.6* 19.8* 20.3*  MCV 92.5 91.4 92.3 90.8 91.0  PLT 440* 443* 439* 408* 404*   Cardiac Enzymes: No results for input(s): CKTOTAL, CKMB, CKMBINDEX, TROPONINI in the last 168 hours. BNP (last 3 results)  Recent Labs  08/29/14 0810 09/10/14 0612  BNP 1117.0* 549.5*    ProBNP (last 3 results)  Recent Labs  01/01/14 0909  PROBNP 5790.0*    CBG: No results for input(s): GLUCAP in the last 168 hours.  Recent Results (from the past 240 hour(s))  MRSA PCR Screening     Status: None   Collection Time: 11/24/14  4:39 PM  Result Value Ref Range Status   MRSA by PCR NEGATIVE NEGATIVE Final    Comment:        The GeneXpert MRSA Assay (FDA approved for NASAL specimens only), is one component of a comprehensive MRSA colonization surveillance program. It is not intended to diagnose MRSA infection nor to guide or monitor treatment for MRSA infections.      Studies: Dg Chest 2 View  11/15/2014  CLINICAL DATA:  Back pain.  Sickle cell anemia. EXAM: CHEST  2 VIEW COMPARISON:  11/13/2014 FINDINGS: Cardiomegaly. Left subclavian vein Port-A-Cath is stable. No pneumothorax. Chronic changes at the lung bases. IMPRESSION: No active cardiopulmonary disease. Electronically Signed   By: Marybelle Killings M.D.   On: 11/15/2014 08:52   Dg Chest 2 View  11/13/2014  CLINICAL DATA:  Sickle cell pain crisis with left lateral chest wall pain EXAM: CHEST  2 VIEW COMPARISON:  10/26/2014 FINDINGS: Chronic cardiomegaly. Negative aortic and hilar contours. Stable left subclavian porta catheter placement, tip at the upper right atrium. Chronic interstitial coarsening consistent with scar. There is no edema, consolidation, effusion, or pneumothorax. No osseous findings to explain  acute pain. Chronic humeral head osteonecrosis. Cholecystectomy. IMPRESSION: Stable lung scarring and cardiomegaly. No evidence of acute cardiopulmonary disease. Electronically Signed   By: Monte Fantasia M.D.   On: 11/13/2014 08:15   Dg Lumbar Spine Complete  11/15/2014  CLINICAL DATA:  Sickle cell pain crisis EXAM: LUMBAR SPINE - COMPLETE 4+ VIEW COMPARISON:  None. FINDINGS: Five lumbar type vertebral bodies. Straightening of the lumbar spine. No evidence of fracture or dislocation. Vertebral body heights are maintained. Mild superior/ inferior endplate changes at L5 characteristic of sickle cell. Visualized bony pelvis appears intact. IMPRESSION: No fracture or dislocation is seen. Electronically Signed   By: Julian Hy M.D.   On: 11/15/2014 11:22   Dg Pelvis 1-2 Views  11/15/2014  CLINICAL DATA:  Sickle cell pain crisis EXAM: PELVIS - 1-2 VIEW COMPARISON:  CT right hip dated 10/27/2013 FINDINGS: Right hip arthroplasty in satisfactory position.  No fracture or dislocation is seen. Left hip joint space is preserved. Visualized bony pelvis appears intact. IMPRESSION: Right hip arthroplasty satisfactory position. No fracture or dislocation is seen. Electronically Signed   By: Julian Hy M.D.   On: 11/15/2014 11:19   Ct Angio Chest Pe W/cm &/or Wo Cm  11/13/2014  CLINICAL DATA:  Chest pain.  Sickle cell disease EXAM: CT ANGIOGRAPHY CHEST WITH CONTRAST TECHNIQUE: Multidetector CT imaging of the chest was performed using the standard protocol during bolus administration of intravenous contrast. Multiplanar CT image reconstructions and MIPs were obtained to evaluate the vascular anatomy. CONTRAST:  168mL OMNIPAQUE IOHEXOL 350 MG/ML SOLN COMPARISON:  Chest CT October 26, 2014 and November 13, 2014 chest radiograph FINDINGS: There is a small pulmonary embolus in the posterior segment right lower lobe pulmonary artery. No more central pulmonary emboli are identified. No evidence of right heart  strain. There is no thoracic aortic aneurysm or dissection. The visualized great vessels appear normal. There is a small area of patchy infiltrate in the anterior segment of the left upper lobe, stable. There is patchy atelectatic change in both bases, stable. No new opacity is appreciable. Cardiomegaly is stable.  The pericardium is not thickened. Visualized thyroid appears normal. Anterior mediastinal adenopathy remains, stable. The largest individual lymph node in the anterior mediastinum measures 2.3 x 1.5 cm, unchanged. There is a single prominent lymph node in the right hilum measuring 1.6 x 1.2 cm. There is no new adenopathy. Central catheter tip is near the cavoatrial junction. In the visualized upper abdomen, there is diffuse calcification of the spleen consistent with a degree of autoinfarction, stable. 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 Bones overall are sclerotic consistent with the chronic anemia. There is a probable sebaceous cyst measuring 2.1 x 1.1 cm in the right anterior chest wall, stable. Review of the MIP images confirms the above findings. IMPRESSION: Small pulmonary embolus in a posterior segment right lower lobe pulmonary artery branch, not present recently and presumed new. No other pulmonary emboli identified. Stable cardiomegaly and anterior mediastinal adenopathy. Single prominent lymph node in the right hilum. Stable areas of patchy lower lobe atelectasis and mild patchy infiltrate in the left upper lobe. No new parenchymal lung opacity. 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 prior splenic autoinfarction. Critical Value/emergent results were called by telephone at the time of interpretation on 11/13/2014 at 12:32 pm to Dr. Joseph Berkshire , who verbally acknowledged these results. Electronically Signed   By: Lowella Grip III M.D.   On: 11/13/2014 12:34   Dg Chest Port 1 View  11/25/2014   CLINICAL DATA:  Current history of sickle cell disease, presenting with hypoxia. Recent diagnosis of pulmonary embolus. EXAM: PORTABLE CHEST 1 VIEW COMPARISON:  Chest x-ray 11/15/2014 and earlier. CTA chest 11/13/2014. CT chest 10/26/2014 and earlier. FINDINGS: Cardiac silhouette massively enlarged, unchanged. Widening of the superior mediastinum, unchanged, shown on prior CT to be due to anterior mediastinal lymphadenopathy. Stable scarring in the lung bases. Lungs otherwise clear. No localized airspace consolidation. No pleural effusions. No pneumothorax. Normal pulmonary vascularity. Left subclavian Port-A-Cath tip projects at the cavoatrial junction, unchanged. IMPRESSION: Stable massive cardiomegaly.  No acute cardiopulmonary disease. Electronically Signed   By: Evangeline Dakin M.D.   On: 11/25/2014 10:01    Scheduled Meds: . sodium chloride   Intravenous Once  . chlorhexidine  15 mL Mouth Rinse BID  . cholecalciferol  2,000 Units Oral Daily  .  diltiazem  120 mg Oral Daily  . enoxaparin  110 mg Subcutaneous Q24H  . folic acid  1 mg Oral q morning - 10a  . HYDROmorphone   Intravenous 6 times per day  . hydroxyurea  1,000 mg Oral Daily  . ketorolac  30 mg Intravenous 4 times per day  . lisinopril  10 mg Oral Daily  . metoprolol succinate  25 mg Oral Daily  . morphine  30 mg Oral Q12H  . potassium chloride SA  20 mEq Oral q morning - 10a  . senna-docusate  1 tablet Oral BID   Continuous Infusions: . dextrose 5 % and 0.45% NaCl 10 mL/hr at 11/25/14 1435    Time spent 30 minutes.

## 2014-11-26 NOTE — Care Management Important Message (Signed)
Important Message  Patient Details  Name: [name] MRN: [id number] Date of Birth: May 02, 1980   Medicare Important Message Given:  [facility] notification given    [name] 11/26/2014, 11:14 AMImportant Message  Patient Details  Name: [name] MRN: [id number] Date of Birth: 1980/08/17   Medicare Important Message Given:  Yes-second notification given    [name] 11/26/2014, 11:14 AM

## 2014-11-26 NOTE — Plan of Care (Addendum)
Problem: Phase I Progression Outcomes Goal: Hemodynamically stable Outcome: Progressing Patient continues to desat into upper 70's low 80's on room air. Have to remind patient to keep [place] or VM on. With 5l [place] sats barely get above 90 % and with VM  50% sats slightly better in mid 90's.

## 2014-11-26 NOTE — Plan of Care (Signed)
Problem: Phase I Progression Outcomes Goal: Pain controlled with appropriate interventions Outcome: Progressing Patient reporting pain 5-6/10 this am.

## 2014-11-27 DIAGNOSIS — R0902 Hypoxemia: Secondary | ICD-10-CM

## 2014-11-27 LAB — PREPARE RBC (CROSSMATCH)

## 2014-11-27 MED ORDER — HYDROMORPHONE HCL 2 MG/ML IJ SOLN
2.0000 mg | INTRAMUSCULAR | Status: DC | PRN
Start: 2014-11-27 — End: 2014-11-28
  Administered 2014-11-28: 2 mg via SUBCUTANEOUS
  Filled 2014-11-27: qty 1

## 2014-11-27 MED ORDER — SODIUM CHLORIDE 0.9 % IV SOLN
Freq: Once | INTRAVENOUS | Status: DC
Start: 2014-11-27 — End: 2014-11-28

## 2014-11-27 NOTE — Progress Notes (Signed)
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 Hypoxemia: After a long conversation with Ebony Hail, he now admits that he has not been wearing his oxygen around the clock as advised. Rather he has only been wearing it during sleep. In light of this new information, my suspicion is that the hypoxemia reflects a worsening of his pulmonary HTN rather than an acute process. He is currently on 11 l/min of Oxygen with a resultant saturation of 95%. I will ask his nurse to wean his oxygen to maintain saturations at .above 92%. He is also receiving 1 unit of blood and will assess how this affects his oxygenation. 2.  Hb SS with crisis: Pain essentiailly controlled on PCA settings and MS Contin. Will continue for now. Continue Toradol  3. Anemia of chronic disease: Pt has a baseline Hb of 7-7.5. Currently he is at baseline. 4. Pulmonary Hypertension with Chronic hypoxic respiratory failure: See above 5. 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 No requirements for SCD's thus SCD's discontinued. 6. Leukocytosis: Pt has no evidence of infection. Likely related to crisis and increased bone marrow activity. Will monitor. 7. Chronic pain: Pt reports that his current regimen of pain management does not seem to be effective and he has had to use more medication to control his pain. He is currently on MS Contin 30 mg q 12 hours. Will defer to Primary Care Physician to direct management of chronic pain. 8. Hypertension: BP well controlled. Continue Lisinopril   Code Status: Full Code Family Communication: N/A Disposition Plan: Not yet ready for discharge  [name].  Pager [phone]. If 7PM-7AM, please  contact night-coverage.  11/27/2014, 12:15 PM  LOS: 4 days    Consultants:  None  Procedures:  None  Antibiotics:  None  HPI/Subjective: Pt reports that pain in ribs at an intensity of 5-6/10. Last BM l2 days ago.  Objective: Filed Vitals:   11/27/14 0550 11/27/14 0749 11/27/14 1050 11/27/14 1120  BP: 111/71  119/87 114/77  Pulse: 61  70 71  Temp: 97.6 F (36.4 C)  98.3 F (36.8 C) 98.6 F (37 C)  TempSrc: Axillary  Oral Oral  Resp: 16 15 21 17   Height:      Weight:      SpO2: 93% 96% 96% 94%   Weight change:   Intake/Output Summary (Last 24 hours) at 11/27/14 1215 Last data filed at 11/27/14 1005  Gross per 24 hour  Intake    998 ml  Output   1025 ml  Net    -27 ml    General: Alert, awake, oriented x3, in no acute distress. No increased work of breathing HEENT: [place]/AT PEERL, EOMI, mild icterus Neck: Trachea midline,  no masses, no thyromegal,y no JVD, no carotid bruit OROPHARYNX:  Moist, No exudate/ erythema/lesions. geographic tongue. Heart: Regular rate and rhythm, without murmurs, rubs, gallops.  Lungs: Clear to auscultation, no wheezing or rhonchi noted. Abdomen: Soft, nontender, nondistended, positive bowel sounds, no masses no hepatosplenomegaly noted.  Neuro: No focal neurological deficits noted cranial nerves II through XII grossly intact.  Strength at functional baseline in bilateral upper and lower extremities. Musculoskeletal: No warm swelling or erythema around joints, no spinal tenderness noted. Psychiatric: Patient  alert and oriented x3, good insight and cognition, good recent to remote recall.    Data Reviewed: Basic Metabolic Panel:  Recent Labs Lab 11/22/14 0057 11/23/14 1327 11/23/14 2020 11/25/14 1030  NA 137 138 140 140  K 3.6 3.5 3.5 3.7  CL 109 112* 112* 115*  CO2 22 21* 21* 19*  GLUCOSE 112* 91 94 115*  BUN 9 8 9 11   CREATININE 0.67 0.53* 0.76 0.67  CALCIUM 8.7* 8.9 8.8* 8.6*  MG  --   --  1.5*  --    Liver Function  Tests:  Recent Labs Lab 11/22/14 0057 11/23/14 1327 11/23/14 2020  AST 41 39 42*  ALT 25 23 22   ALKPHOS 88 70 68  BILITOT 7.9* 8.4* 8.1*  PROT 7.6 7.2 7.4  ALBUMIN 4.1 3.9 4.1   No results for input(s): LIPASE, AMYLASE in the last 168 hours. No results for input(s): AMMONIA in the last 168 hours. CBC:  Recent Labs Lab 11/22/14 0057 11/23/14 1327 11/23/14 2020 11/25/14 1030 11/26/14 1100  WBC 17.6* 16.8* 16.6* 17.5* 17.2*  NEUTROABS 11.4* 11.2* 11.2* 11.5* 10.6*  HGB 8.0* 7.4* 7.5* 7.1* 7.2*  HCT 22.3* 21.2* 21.6* 19.8* 20.3*  MCV 92.5 91.4 92.3 90.8 91.0  PLT 440* 443* 439* 408* 404*   Cardiac Enzymes: No results for input(s): CKTOTAL, CKMB, CKMBINDEX, TROPONINI in the last 168 hours. BNP (last 3 results)  Recent Labs  08/29/14 0810 09/10/14 0612  BNP 1117.0* 549.5*    ProBNP (last 3 results)  Recent Labs  01/01/14 0909  PROBNP 5790.0*    CBG: No results for input(s): GLUCAP in the last 168 hours.  Recent Results (from the past 240 hour(s))  MRSA PCR Screening     Status: None   Collection Time: 11/24/14  4:39 PM  Result Value Ref Range Status   MRSA by PCR NEGATIVE NEGATIVE Final    Comment:        The GeneXpert MRSA Assay (FDA approved for NASAL specimens only), is one component of a comprehensive MRSA colonization surveillance program. It is not intended to diagnose MRSA infection nor to guide or monitor treatment for MRSA infections.      Studies: Dg Chest 2 View  11/26/2014  CLINICAL DATA:  Sickle cell crisis, shortness of breath. EXAM: CHEST  2 VIEW COMPARISON:  Chest x-ray of November 25, 2014 FINDINGS: The cardiopericardial silhouette remains enlarged. The pulmonary vascularity is less engorged today. The lungs are well-expanded. There is no focal infiltrate or pleural effusion. The power port appliance tip is stable projecting over the distal third of the SVC just proximal to the cavoatrial junction. There are stable coarse  retrocardiac lung markings. There is multilevel degenerative disc disease of the thoracic spine. IMPRESSION: Improved appearance of the pulmonary vascularity and pulmonary interstitium consistent with resolution of pulmonary edema. Stable cardiomegaly. There is no evidence of pneumonia. Electronically Signed   By: David  Martinique M.D.   On: 11/26/2014 15:30   Dg Chest 2 View  11/15/2014  CLINICAL DATA:  Back pain.  Sickle cell anemia. EXAM: CHEST  2 VIEW COMPARISON:  11/13/2014 FINDINGS: Cardiomegaly. Left subclavian vein Port-A-Cath is stable. No pneumothorax. Chronic changes at the lung bases. IMPRESSION: No active cardiopulmonary disease. Electronically Signed   By: Marybelle Killings M.D.   On: 11/15/2014 08:52   Dg Chest 2 View  11/13/2014  CLINICAL DATA:  Sickle cell pain crisis with left lateral chest wall pain EXAM: CHEST  2 VIEW COMPARISON:  10/26/2014 FINDINGS: Chronic  cardiomegaly. Negative aortic and hilar contours. Stable left subclavian porta catheter placement, tip at the upper right atrium. Chronic interstitial coarsening consistent with scar. There is no edema, consolidation, effusion, or pneumothorax. No osseous findings to explain acute pain. Chronic humeral head osteonecrosis. Cholecystectomy. IMPRESSION: Stable lung scarring and cardiomegaly. No evidence of acute cardiopulmonary disease. Electronically Signed   By: Monte Fantasia M.D.   On: 11/13/2014 08:15   Dg Lumbar Spine Complete  11/15/2014  CLINICAL DATA:  Sickle cell pain crisis EXAM: LUMBAR SPINE - COMPLETE 4+ VIEW COMPARISON:  None. FINDINGS: Five lumbar type vertebral bodies. Straightening of the lumbar spine. No evidence of fracture or dislocation. Vertebral body heights are maintained. Mild superior/ inferior endplate changes at L5 characteristic of sickle cell. Visualized bony pelvis appears intact. IMPRESSION: No fracture or dislocation is seen. Electronically Signed   By: Julian Hy M.D.   On: 11/15/2014 11:22   Dg  Pelvis 1-2 Views  11/15/2014  CLINICAL DATA:  Sickle cell pain crisis EXAM: PELVIS - 1-2 VIEW COMPARISON:  CT right hip dated 10/27/2013 FINDINGS: Right hip arthroplasty in satisfactory position. No fracture or dislocation is seen. Left hip joint space is preserved. Visualized bony pelvis appears intact. IMPRESSION: Right hip arthroplasty satisfactory position. No fracture or dislocation is seen. Electronically Signed   By: Julian Hy M.D.   On: 11/15/2014 11:19   Ct Angio Chest Pe W/cm &/or Wo Cm  11/13/2014  CLINICAL DATA:  Chest pain.  Sickle cell disease EXAM: CT ANGIOGRAPHY CHEST WITH CONTRAST TECHNIQUE: Multidetector CT imaging of the chest was performed using the standard protocol during bolus administration of intravenous contrast. Multiplanar CT image reconstructions and MIPs were obtained to evaluate the vascular anatomy. CONTRAST:  145mL OMNIPAQUE IOHEXOL 350 MG/ML SOLN COMPARISON:  Chest CT October 26, 2014 and November 13, 2014 chest radiograph FINDINGS: There is a small pulmonary embolus in the posterior segment right lower lobe pulmonary artery. No more central pulmonary emboli are identified. No evidence of right heart strain. There is no thoracic aortic aneurysm or dissection. The visualized great vessels appear normal. There is a small area of patchy infiltrate in the anterior segment of the left upper lobe, stable. There is patchy atelectatic change in both bases, stable. No new opacity is appreciable. Cardiomegaly is stable.  The pericardium is not thickened. Visualized thyroid appears normal. Anterior mediastinal adenopathy remains, stable. The largest individual lymph node in the anterior mediastinum measures 2.3 x 1.5 cm, unchanged. There is a single prominent lymph node in the right hilum measuring 1.6 x 1.2 cm. There is no new adenopathy. Central catheter tip is near the cavoatrial junction. In the visualized upper abdomen, there is diffuse calcification of the spleen  consistent with a degree of autoinfarction, stable. 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 Bones overall are sclerotic consistent with the chronic anemia. There is a probable sebaceous cyst measuring 2.1 x 1.1 cm in the right anterior chest wall, stable. Review of the MIP images confirms the above findings. IMPRESSION: Small pulmonary embolus in a posterior segment right lower lobe pulmonary artery branch, not present recently and presumed new. No other pulmonary emboli identified. Stable cardiomegaly and anterior mediastinal adenopathy. Single prominent lymph node in the right hilum. Stable areas of patchy lower lobe atelectasis and mild patchy infiltrate in the left upper lobe. No new parenchymal lung opacity. 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 prior splenic autoinfarction. Critical Value/emergent results were called  by telephone at the time of interpretation on 11/13/2014 at 12:32 pm to Dr. Joseph Berkshire , who verbally acknowledged these results. Electronically Signed   By: Lowella Grip III M.D.   On: 11/13/2014 12:34   Dg Chest Port 1 View  11/25/2014  CLINICAL DATA:  Current history of sickle cell disease, presenting with hypoxia. Recent diagnosis of pulmonary embolus. EXAM: PORTABLE CHEST 1 VIEW COMPARISON:  Chest x-ray 11/15/2014 and earlier. CTA chest 11/13/2014. CT chest 10/26/2014 and earlier. FINDINGS: Cardiac silhouette massively enlarged, unchanged. Widening of the superior mediastinum, unchanged, shown on prior CT to be due to anterior mediastinal lymphadenopathy. Stable scarring in the lung bases. Lungs otherwise clear. No localized airspace consolidation. No pleural effusions. No pneumothorax. Normal pulmonary vascularity. Left subclavian Port-A-Cath tip projects at the cavoatrial junction, unchanged. IMPRESSION: Stable massive cardiomegaly.  No acute cardiopulmonary disease. Electronically Signed    By: Evangeline Dakin M.D.   On: 11/25/2014 10:01    Scheduled Meds: . sodium chloride   Intravenous Once  . chlorhexidine  15 mL Mouth Rinse BID  . cholecalciferol  2,000 Units Oral Daily  . diltiazem  120 mg Oral Daily  . enoxaparin  110 mg Subcutaneous Q24H  . folic acid  1 mg Oral q morning - 10a  . HYDROmorphone   Intravenous 6 times per day  . hydroxyurea  1,000 mg Oral Daily  . ketorolac  30 mg Intravenous 4 times per day  . lisinopril  10 mg Oral Daily  . metoprolol succinate  25 mg Oral Daily  . morphine  30 mg Oral Q12H  . potassium chloride SA  20 mEq Oral q morning - 10a  . senna-docusate  1 tablet Oral BID   Continuous Infusions: . dextrose 5 % and 0.45% NaCl 10 mL/hr at 11/26/14 1704    Time spent 30 minutes.

## 2014-11-28 DIAGNOSIS — R000 Tachycardia, unspecified: Secondary | ICD-10-CM

## 2014-11-28 LAB — CBC WITH DIFFERENTIAL/PLATELET
Basophils Absolute: 0.2 10*3/uL — ABNORMAL HIGH (ref 0.0–0.1)
Basophils Relative: 1 %
EOS ABS: 0.6 10*3/uL (ref 0.0–0.7)
Eosinophils Relative: 4 %
HCT: 20.5 % — ABNORMAL LOW (ref 39.0–52.0)
HEMOGLOBIN: 7.4 g/dL — AB (ref 13.0–17.0)
Lymphocytes Relative: 15 %
Lymphs Abs: 2.3 10*3/uL (ref 0.7–4.0)
MCH: 32.5 pg (ref 26.0–34.0)
MCHC: 36.1 g/dL — AB (ref 30.0–36.0)
MCV: 89.9 fL (ref 78.0–100.0)
MONOS PCT: 11 %
Monocytes Absolute: 1.7 10*3/uL — ABNORMAL HIGH (ref 0.1–1.0)
NEUTROS ABS: 10.6 10*3/uL — AB (ref 1.7–7.7)
NRBC: 8 /100{WBCs} — AB
Neutrophils Relative %: 69 %
Platelets: 356 10*3/uL (ref 150–400)
RBC: 2.28 MIL/uL — ABNORMAL LOW (ref 4.22–5.81)
RDW: 18.5 % — ABNORMAL HIGH (ref 11.5–15.5)
WBC: 15.4 10*3/uL — ABNORMAL HIGH (ref 4.0–10.5)

## 2014-11-28 LAB — TYPE AND SCREEN
ABO/RH(D): O POS
Antibody Screen: NEGATIVE
Unit division: 0

## 2014-11-28 IMAGING — CR DG CHEST 1V PORT
1 series · 1 of 1 positions shown · non-contrast
Comparison: 12/10/2011

CLINICAL DATA: Status post Port-A-Cath insertion.  History of
sickle cell anemia.

PORTABLE CHEST - 1 VIEW

[AP]
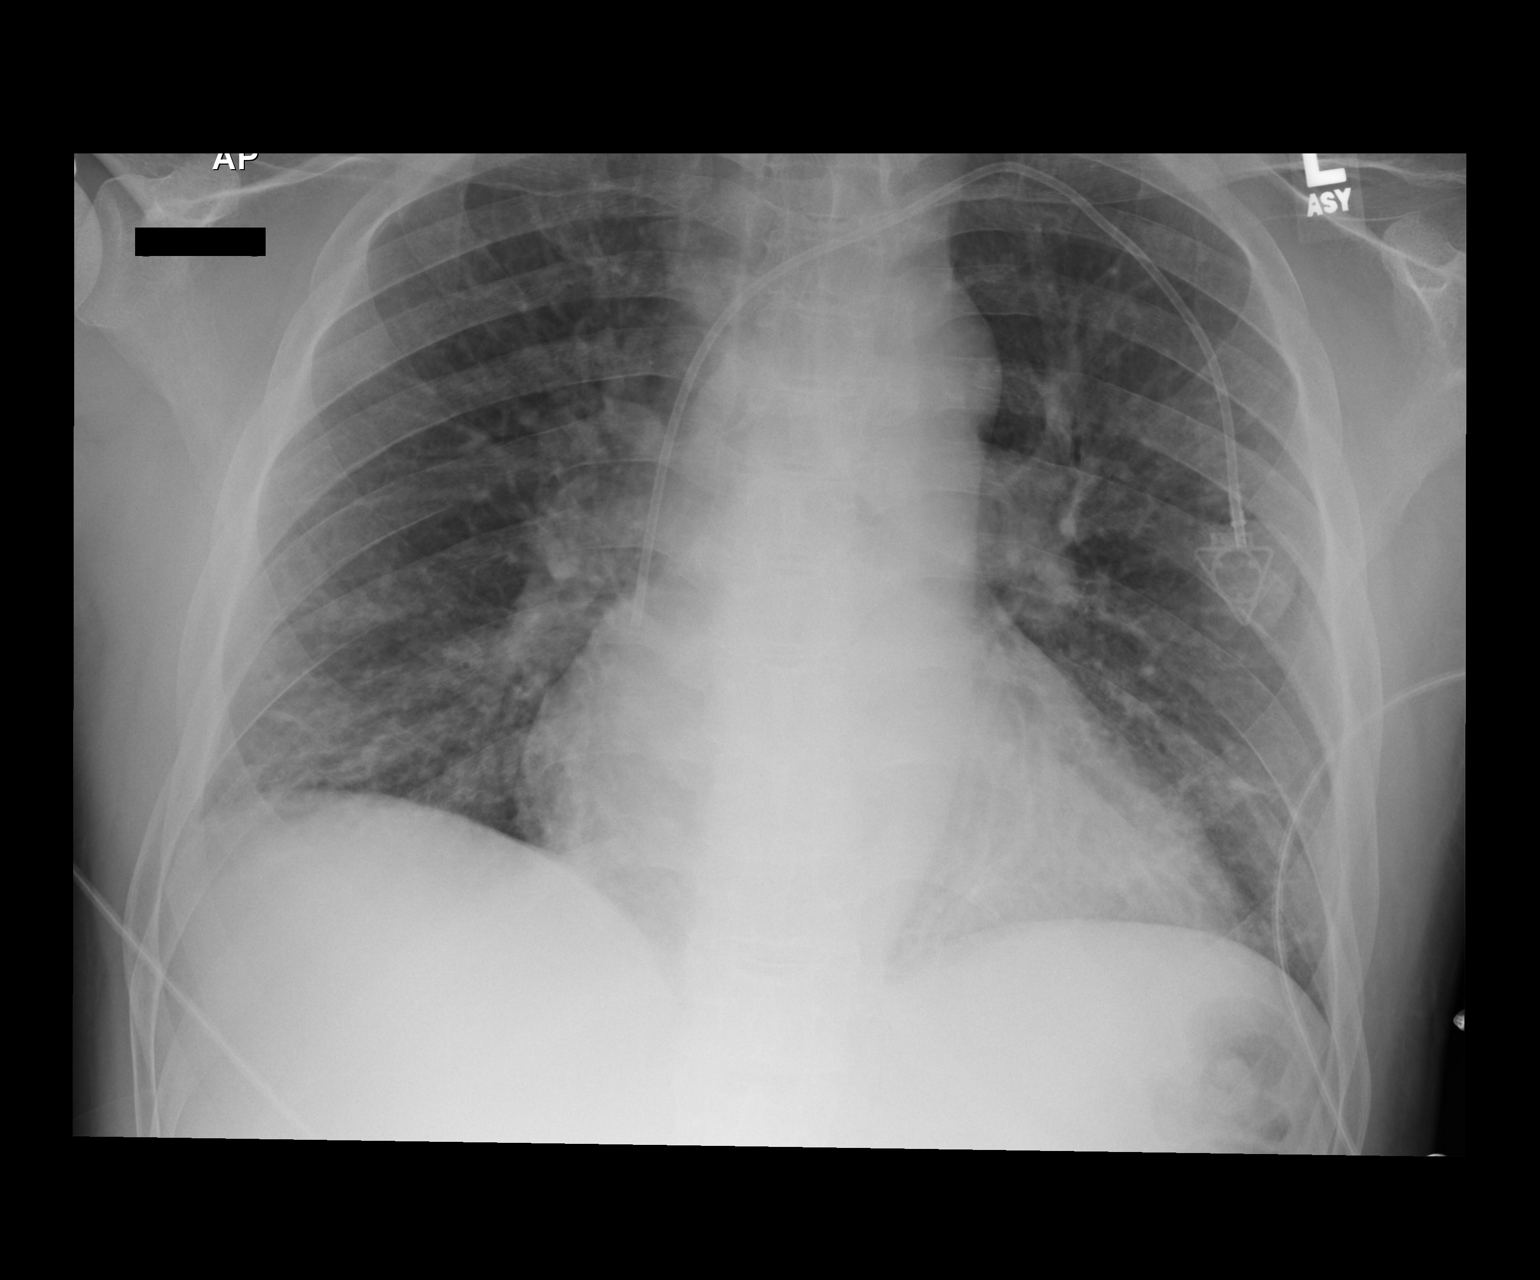

[1 of 1 positions shown; findings below may reference images not displayed]

FINDINGS: A left subclavian Port-A-Cath has been placed with the
catheter tip at the cavoatrial junction.  No pneumothorax is
present after placement.  Lungs show chronic disease and bibasilar
scarring.  No pulmonary edema or pleural fluid identified.  Cardiac
and mediastinal contours are stable.
IMPRESSION: Port-A-Cath tip lies at the cavoatrial junction.  No pneumothorax
or other acute findings after placement.

## 2014-11-28 MED ORDER — HEPARIN SOD (PORK) LOCK FLUSH 100 UNIT/ML IV SOLN
500.0000 [IU] | INTRAVENOUS | Status: AC | PRN
  Administered 2014-11-28: 500 [IU]

## 2014-11-28 MED ORDER — SODIUM CHLORIDE 0.9 % IJ SOLN: 10.0000 mL | INTRAMUSCULAR | Status: DC | PRN

## 2014-11-28 MED ORDER — METOPROLOL SUCCINATE ER 50 MG PO TB24: 50.0000 mg | ORAL_TABLET | Freq: Every day | ORAL | Status: DC

## 2014-11-28 NOTE — Discharge Summary (Signed)
[name] MRN: [id number] DOB/AGE: 1980/03/13 34 y.o.  Admit date: 11/23/2014 Discharge date: 11/28/2014  Primary Care Physician:  [name], MD   Discharge Diagnoses:   Patient Active Problem List   Diagnosis Date Noted  . Atrial fibrillation with RVR ([place]) 08/29/2014    Priority: High  . Cor pulmonale, chronic ([place]) 08/25/2014    Priority: High  . Pulmonary HTN ([place]) 06/18/2013    Priority: High  . Functional asplenia     Priority: High  . Anemia of chronic disease 06/25/2014    Priority: Medium  . Hypoxia 03/14/2014    Priority: Medium  . Hb-SS disease with crisis ([place]) 01/22/2014    Priority: Medium  . Chronic anticoagulation 08/22/2013    Priority: Medium  . Avascular necrosis ([place])     Priority: Low  . Sickle cell pain crisis ([place]) 11/23/2014  . Sickle-cell disease with pain ([place]) 11/18/2014  . Chest pain   . Hb-SS disease without crisis ([place]) 10/07/2014  . Respiratory failure ([place])   . Acute respiratory failure with hypoxia ([place])   . Acute chest syndrome in sickle crisis ([place])   . PVC's (premature ventricular contractions) 09/10/2014  . Abnormal EKG 09/10/2014  . Hypoxemia   . Chronic atrial fibrillation ([place]) 09/09/2014  . Sickle cell anemia with pain ([place]) 09/09/2014  . Sickle cell crisis ([place]) 09/08/2014  . Paronychia of great toe, left   . Bacteremia   . Leukocytosis   . Elevated troponin I level 08/29/2014  . Hypokalemia 08/29/2014  . Ankle edema 07/07/2014  . Sickle cell anemia ([place]) 06/25/2014  . PAH (pulmonary artery hypertension) ([place]) 03/18/2014  . Paralytic strabismus, external ophthalmoplegia   . Chronic pain syndrome 12/12/2013  . Essential hypertension 08/22/2013  . Vitamin D deficiency 02/13/2013  . Uses marijuana 01/13/2013  . Embolism, pulmonary with infarction ([place]) 07/09/2012  . Hx of pulmonary embolus 06/29/2012  . Hemochromatosis 12/14/2011    DISCHARGE MEDICATION:   Medication List    STOP taking these  medications        diltiazem 120 MG 24 hr capsule  Commonly known as:  CARDIZEM CD      TAKE these medications        aspirin 81 MG chewable tablet  Chew 1 tablet (81 mg total) by mouth daily.     enoxaparin 150 MG/ML injection  Commonly known as:  LOVENOX  Inject 0.73 mLs (110 mg total) into the skin daily.     folic acid 1 MG tablet  Commonly known as:  FOLVITE  Take 1 tablet (1 mg total) by mouth every morning.     HYDROmorphone 4 MG tablet  Commonly known as:  DILAUDID  Take 1 tablet (4 mg total) by mouth every 4 (four) hours as needed for severe pain.     hydroxyurea 500 MG capsule  Commonly known as:  HYDREA  Take 1 capsule (500 mg total) by mouth daily. May take with food to minimize GI side effects.     lisinopril 10 MG tablet  Commonly known as:  PRINIVIL,ZESTRIL  Take 1 tablet (10 mg total) by mouth daily.     metoprolol succinate 50 MG 24 hr tablet  Commonly known as:  TOPROL-XL  Take 1 tablet (50 mg total) by mouth daily.     morphine 30 MG 12 hr tablet  Commonly known as:  MS CONTIN  Take 1 tablet (30 mg total) by mouth every 12 (twelve) hours.     potassium chloride SA 20  MEQ tablet  Commonly known as:  K-DUR,KLOR-CON  Take 1 tablet (20 mEq total) by mouth every morning.     Vitamin D 2000 UNITS tablet  Take 1 tablet (2,000 Units total) by mouth daily.     zolpidem 10 MG tablet  Commonly known as:  AMBIEN  Take 1 tablet (10 mg total) by mouth at bedtime as needed for sleep.          Consults:     SIGNIFICANT DIAGNOSTIC STUDIES:  Dg Chest 2 View  11/26/2014  CLINICAL DATA:  Sickle cell crisis, shortness of breath. EXAM: CHEST  2 VIEW COMPARISON:  Chest x-ray of November 25, 2014 FINDINGS: The cardiopericardial silhouette remains enlarged. The pulmonary vascularity is less engorged today. The lungs are well-expanded. There is no focal infiltrate or pleural effusion. The power port appliance tip is stable projecting over the distal third of the  SVC just proximal to the cavoatrial junction. There are stable coarse retrocardiac lung markings. There is multilevel degenerative disc disease of the thoracic spine. IMPRESSION: Improved appearance of the pulmonary vascularity and pulmonary interstitium consistent with resolution of pulmonary edema. Stable cardiomegaly. There is no evidence of pneumonia. Electronically Signed   By: David  Martinique M.D.   On: 11/26/2014 15:30   Dg Chest 2 View  11/15/2014  CLINICAL DATA:  Back pain.  Sickle cell anemia. EXAM: CHEST  2 VIEW COMPARISON:  11/13/2014 FINDINGS: Cardiomegaly. Left subclavian vein Port-A-Cath is stable. No pneumothorax. Chronic changes at the lung bases. IMPRESSION: No active cardiopulmonary disease. Electronically Signed   By: Marybelle Killings M.D.   On: 11/15/2014 08:52   Dg Chest 2 View  11/13/2014  CLINICAL DATA:  Sickle cell pain crisis with left lateral chest wall pain EXAM: CHEST  2 VIEW COMPARISON:  10/26/2014 FINDINGS: Chronic cardiomegaly. Negative aortic and hilar contours. Stable left subclavian porta catheter placement, tip at the upper right atrium. Chronic interstitial coarsening consistent with scar. There is no edema, consolidation, effusion, or pneumothorax. No osseous findings to explain acute pain. Chronic humeral head osteonecrosis. Cholecystectomy. IMPRESSION: Stable lung scarring and cardiomegaly. No evidence of acute cardiopulmonary disease. Electronically Signed   By: Monte Fantasia M.D.   On: 11/13/2014 08:15   Dg Lumbar Spine Complete  11/15/2014  CLINICAL DATA:  Sickle cell pain crisis EXAM: LUMBAR SPINE - COMPLETE 4+ VIEW COMPARISON:  None. FINDINGS: Five lumbar type vertebral bodies. Straightening of the lumbar spine. No evidence of fracture or dislocation. Vertebral body heights are maintained. Mild superior/ inferior endplate changes at L5 characteristic of sickle cell. Visualized bony pelvis appears intact. IMPRESSION: No fracture or dislocation is seen.  Electronically Signed   By: Julian Hy M.D.   On: 11/15/2014 11:22   Dg Pelvis 1-2 Views  11/15/2014  CLINICAL DATA:  Sickle cell pain crisis EXAM: PELVIS - 1-2 VIEW COMPARISON:  CT right hip dated 10/27/2013 FINDINGS: Right hip arthroplasty in satisfactory position. No fracture or dislocation is seen. Left hip joint space is preserved. Visualized bony pelvis appears intact. IMPRESSION: Right hip arthroplasty satisfactory position. No fracture or dislocation is seen. Electronically Signed   By: Julian Hy M.D.   On: 11/15/2014 11:19   Ct Angio Chest Pe W/cm &/or Wo Cm  11/13/2014  CLINICAL DATA:  Chest pain.  Sickle cell disease EXAM: CT ANGIOGRAPHY CHEST WITH CONTRAST TECHNIQUE: Multidetector CT imaging of the chest was performed using the standard protocol during bolus administration of intravenous contrast. Multiplanar CT image reconstructions and MIPs were obtained to evaluate  the vascular anatomy. CONTRAST:  166mL OMNIPAQUE IOHEXOL 350 MG/ML SOLN COMPARISON:  Chest CT October 26, 2014 and November 13, 2014 chest radiograph FINDINGS: There is a small pulmonary embolus in the posterior segment right lower lobe pulmonary artery. No more central pulmonary emboli are identified. No evidence of right heart strain. There is no thoracic aortic aneurysm or dissection. The visualized great vessels appear normal. There is a small area of patchy infiltrate in the anterior segment of the left upper lobe, stable. There is patchy atelectatic change in both bases, stable. No new opacity is appreciable. Cardiomegaly is stable.  The pericardium is not thickened. Visualized thyroid appears normal. Anterior mediastinal adenopathy remains, stable. The largest individual lymph node in the anterior mediastinum measures 2.3 x 1.5 cm, unchanged. There is a single prominent lymph node in the right hilum measuring 1.6 x 1.2 cm. There is no new adenopathy. Central catheter tip is near the cavoatrial junction. In the  visualized upper abdomen, there is diffuse calcification of the spleen consistent with a degree of autoinfarction, stable. 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 Bones overall are sclerotic consistent with the chronic anemia. There is a probable sebaceous cyst measuring 2.1 x 1.1 cm in the right anterior chest wall, stable. Review of the MIP images confirms the above findings. IMPRESSION: Small pulmonary embolus in a posterior segment right lower lobe pulmonary artery branch, not present recently and presumed new. No other pulmonary emboli identified. Stable cardiomegaly and anterior mediastinal adenopathy. Single prominent lymph node in the right hilum. Stable areas of patchy lower lobe atelectasis and mild patchy infiltrate in the left upper lobe. No new parenchymal lung opacity. 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 prior splenic autoinfarction. Critical Value/emergent results were called by telephone at the time of interpretation on 11/13/2014 at 12:32 pm to Dr. Joseph Berkshire , who verbally acknowledged these results. Electronically Signed   By: Lowella Grip III M.D.   On: 11/13/2014 12:34   Dg Chest Port 1 View  11/25/2014  CLINICAL DATA:  Current history of sickle cell disease, presenting with hypoxia. Recent diagnosis of pulmonary embolus. EXAM: PORTABLE CHEST 1 VIEW COMPARISON:  Chest x-ray 11/15/2014 and earlier. CTA chest 11/13/2014. CT chest 10/26/2014 and earlier. FINDINGS: Cardiac silhouette massively enlarged, unchanged. Widening of the superior mediastinum, unchanged, shown on prior CT to be due to anterior mediastinal lymphadenopathy. Stable scarring in the lung bases. Lungs otherwise clear. No localized airspace consolidation. No pleural effusions. No pneumothorax. Normal pulmonary vascularity. Left subclavian Port-A-Cath tip projects at the cavoatrial junction, unchanged. IMPRESSION: Stable massive  cardiomegaly.  No acute cardiopulmonary disease. Electronically Signed   By: Evangeline Dakin M.D.   On: 11/25/2014 10:01       Recent Results (from the past 240 hour(s))  MRSA PCR Screening     Status: None   Collection Time: 11/24/14  4:39 PM  Result Value Ref Range Status   MRSA by PCR NEGATIVE NEGATIVE Final    Comment:        The GeneXpert MRSA Assay (FDA approved for NASAL specimens only), is one component of a comprehensive MRSA colonization surveillance program. It is not intended to diagnose MRSA infection nor to guide or monitor treatment for MRSA infections.     BRIEF ADMITTING H & P: Patient is a 34 year old gentleman well-known to our service with history of sickle cell disease and frequent sickle cell crisis. Patient presented to the ER with 3  days of progressive pain in his back and legs. Pain got worse this morning. He has taken his home medications but no relief. Denied any shortness of breath or cough denied any fever no nausea vomiting or diarrhea. He is supposed to be on home oxygen due to significant pulmonary hypertension. Patient has not been using his oxygen as he show. Denied any fever or cough. No significant shortness of breath. Patient has received up to 8 mg of Dilaudid in the ER and pain is currently a 7 out of 10 sharp involving his limbs and lower back and not getting any better.   Hospital Course:  Present on Admission:  . Acute in chronic Hypoxic Respiratory Failure: Pt has a h/o Cor Pulmonale and chronic hypoxic respiratory failure. He has been prescribed Oxygen to be used continuously but has not been compliant with Oxygen, He also has recurrent pulmonary emboli and is on chronic anticoagulation to which he reports compliance. On this admission he has had an increased Oxygen requirement. No acute process was discovered during this hospitalization but it is possible that he could have another small PE that is contributing. However in light od the fact  that there would be no further interventions (Pt already on injectable and does not want to consider and is not a candidate for IVF filter) I see no value to repeating the CT angiogram as he has no hemodynamic instability. At the time of discharge he is requiring 4 l/min of Oxygen to maintain saturations at 92%. He already has oxygen at home and has an appointment with [facility] to instruct on use of concentrators. I have spoken at length with the patient about the importance of using his Oxygen continuously. Pt is discharged in stable condition on 4 l/min of continuous Oxygen. Unfortunately I think that [place] has a poor long term prognosis as his Oxygen continues to rise.  Marland Kitchen Hb-SS disease with crisis [facility]): Pt was initially managed with IV Dilaudid via PCA, Toradol and IVF. His MS Contin was continued for management of Chronic Pain. As pain improved he was transitioned to oral analgesics and is discharged home on Dilaudid 4 mg PRN and MS Contin every 12 hours. He is to follow up with his PMD in 1 week.  . Pulmonary HTN ([place]): See above.  . Anemia of chronic disease: Pt has a baseline Hb of 7. He was just below baseline and due to his fragile pulmonary state, he was transfused 1 unit of blood mainly because of his increasing Oxygen requirements. However the transfusion did not seem to improve his  Oxygenation. At the time of discharge his Hb was 7.4. He is continued on Hydrea 500 mg daily and is to follow up with PMD for titration of Hydrea to MTD.   Marland Kitchen Chronic pain syndrome: Pt is continued on MS Contin.    Marland Kitchen HTN: Well controlled on Lisinopril and Metoprolol  . Tachycardia: Pt was on low dose of Cardizem as well as low dose of Metoprolol. I discontinued Cardizem and changed to Metoprolol XL 50 mg daily for better adrenergic control.   Disposition and Follow-up:  Pt discharged in good condition and is to follow up with PMD as scheduled or 1 week.     Discharge Instructions    Activity as tolerated -  No restrictions    Complete by:  As directed      Diet general    Complete by:  As directed      For home use only DME  oxygen    Complete by:  As directed   Mode or (Route):  Nasal cannula  Liters per Minute:  4  Frequency:  Continuous (stationary and portable oxygen unit needed)  Oxygen conserving device:  Yes  Oxygen delivery system:  Gas           DISCHARGE EXAM:  General: Alert, awake, oriented x3, in no acute distress.  Vital Signs: BP 124/89, HR 67, T 97.7 F (36.5 C), temperature source Oral, RR 20, height 6' (1.829 m), weight 165 lb 8 oz (75.07 kg), SpO2 92 %. HEENT: [place]/AT PEERL, EOMI, mild icterus at baseline. OROPHARYNX: Moist, No exudate/ erythema/lesions. Geographic tongue. Heart: Regular rate and rhythm, without murmurs, rubs, gallops or S3. Loud P2 Pulmonary/Chest: Normal effort. Breath sounds normal. No. Apnea. Clear to auscultation,no stridor,  no wheezing and no rhonchi noted. No respiratory distress and no tenderness noted. Abdomen: Soft, nontender, nondistended, normal bowel sounds, no masses no hepatosplenomegaly noted. No fluid wave and no ascites. There is no guarding or rebound. Neuro: Alert and oriented to person, place and time. Normal motor skills, Displays no atrophy or tremors and exhibits normal muscle tone.  No focal neurological deficits noted cranial nerves II through XII grossly intact. No sensory deficit noted. Strength at baseline in bilateral upper and lower extremities. Gait normal. Musculoskeletal: No warm swelling or erythema around joints, no spinal tenderness noted. Psychiatric: Patient alert and oriented x3, good insight and cognition, good recent to remote recall. Mood, memory, affect and judgement normal Lymph node survey: No cervical axillary or inguinal lymphadenopathy noted. Skin: Skin is warm and dry. No bruising, no ecchymosis and no rash noted. Pt is not diaphoretic. No erythema. No pallor    No results for input(s): NA, K, CL, CO2,  GLUCOSE, BUN, CREATININE, CALCIUM, MG, PHOS in the last 72 hours. No results for input(s): AST, ALT, ALKPHOS, BILITOT, PROT, ALBUMIN in the last 72 hours. No results for input(s): LIPASE, AMYLASE in the last 72 hours.  Recent Labs  11/26/14 1100 11/28/14 1020  WBC 17.2* PENDING  NEUTROABS 10.6* PENDING  HGB 7.2* 7.4*  HCT 20.3* 20.5*  MCV 91.0 89.9  PLT 404* 356     Total time spent including face to face and decision making was greater than 30 minutes  Signed: [name],[name] 11/28/2014, 10:54 AM

## 2014-11-28 NOTE — Progress Notes (Signed)
SATURATION QUALIFICATIONS: (This note is used to comply with regulatory documentation for home oxygen)  Patient Saturations on 3Liters at Rest = 90 %  Patient Saturations on 3 Liters while Ambulating = 81%  Patient Saturations on 4 Liters of oxygen while Ambulating = 92%  Please briefly explain why patient needs home oxygen:

## 2014-11-28 NOTE — Progress Notes (Signed)
11/28/14  1130  Reviewed discharge instructions with Patient.  Patient verbalized understanding of discharge instructions. Copy of discharge instructions given to patient.

## 2014-11-28 NOTE — Progress Notes (Signed)
11/28/14  1215  Wasted Hydromorphone HCL 26mL in sink with Josph Macho, Therapist, sports. I agree. Kirkland Hun

## 2014-11-29 ENCOUNTER — Emergency Department (HOSPITAL_COMMUNITY)
Admission: EM | Admit: 2014-11-29 | Discharge: 2014-11-30 | Disposition: A | Discharge status: Home / Self Care | Payer: Medicare Other | Source: Home / Self Care | Attending: Emergency Medicine | Admitting: Emergency Medicine

## 2014-11-29 ENCOUNTER — Encounter (HOSPITAL_COMMUNITY): Payer: Self-pay | Admitting: *Deleted

## 2014-11-29 DIAGNOSIS — I2699 Other pulmonary embolism without acute cor pulmonale: Secondary | ICD-10-CM | POA: Diagnosis not present

## 2014-11-29 DIAGNOSIS — D5700 Hb-SS disease with crisis, unspecified: Secondary | ICD-10-CM

## 2014-11-29 LAB — CBC WITH DIFFERENTIAL/PLATELET
Basophils Absolute: 0.1 10*3/uL (ref 0.0–0.1)
Basophils Relative: 1 %
EOS ABS: 0.3 10*3/uL (ref 0.0–0.7)
EOS PCT: 2 %
HCT: 22.2 % — ABNORMAL LOW (ref 39.0–52.0)
HEMOGLOBIN: 7.9 g/dL — AB (ref 13.0–17.0)
LYMPHS ABS: 1.9 10*3/uL (ref 0.7–4.0)
Lymphocytes Relative: 13 %
MCH: 31.2 pg (ref 26.0–34.0)
MCHC: 35.6 g/dL (ref 30.0–36.0)
MCV: 87.7 fL (ref 78.0–100.0)
MONOS PCT: 15 %
Monocytes Absolute: 2.1 10*3/uL — ABNORMAL HIGH (ref 0.1–1.0)
NEUTROS PCT: 70 %
Neutro Abs: 10 10*3/uL — ABNORMAL HIGH (ref 1.7–7.7)
Platelets: 413 10*3/uL — ABNORMAL HIGH (ref 150–400)
RBC: 2.53 MIL/uL — ABNORMAL LOW (ref 4.22–5.81)
RDW: 20.3 % — ABNORMAL HIGH (ref 11.5–15.5)
WBC: 14.3 10*3/uL — ABNORMAL HIGH (ref 4.0–10.5)

## 2014-11-29 LAB — COMPREHENSIVE METABOLIC PANEL WITH GFR
ALT: 23 U/L (ref 17–63)
AST: 43 U/L — ABNORMAL HIGH (ref 15–41)
Albumin: 3.9 g/dL (ref 3.5–5.0)
Alkaline Phosphatase: 82 U/L (ref 38–126)
Anion gap: 6 (ref 5–15)
BUN: 7 mg/dL (ref 6–20)
CO2: 22 mmol/L (ref 22–32)
Calcium: 9 mg/dL (ref 8.9–10.3)
Chloride: 108 mmol/L (ref 101–111)
Creatinine, Ser: 0.51 mg/dL — ABNORMAL LOW (ref 0.61–1.24)
GFR calc Af Amer: >60 mL/min
GFR calc non Af Amer: >60 mL/min
Glucose, Bld: 102 mg/dL — ABNORMAL HIGH (ref 65–99)
Potassium: 3.6 mmol/L (ref 3.5–5.1)
Sodium: 136 mmol/L (ref 135–145)
Total Bilirubin: 7.9 mg/dL — ABNORMAL HIGH (ref 0.3–1.2)
Total Protein: 7 g/dL (ref 6.5–8.1)

## 2014-11-29 LAB — RETICULOCYTES
RBC.: 2.53 MIL/uL — ABNORMAL LOW (ref 4.22–5.81)
RETIC CT PCT: 20.4 % — AB (ref 0.4–3.1)
Retic Count, Absolute: 516.1 10*3/uL — ABNORMAL HIGH (ref 19.0–186.0)

## 2014-11-29 MED ORDER — HYDROMORPHONE HCL 2 MG/ML IJ SOLN
2.0000 mg | Freq: Once | INTRAMUSCULAR | Status: AC
  Administered 2014-11-29: 2 mg via INTRAVENOUS
  Filled 2014-11-29: qty 1

## 2014-11-29 MED ORDER — HEPARIN SOD (PORK) LOCK FLUSH 100 UNIT/ML IV SOLN
500.0000 [IU] | Freq: Once | INTRAVENOUS | Status: AC
  Administered 2014-11-29: 500 [IU]
  Filled 2014-11-29: qty 5

## 2014-11-29 MED ORDER — KETOROLAC TROMETHAMINE 30 MG/ML IJ SOLN
30.0000 mg | Freq: Once | INTRAMUSCULAR | Status: AC
Start: 2014-11-29 — End: 2014-11-29
  Administered 2014-11-29: 30 mg via INTRAVENOUS
  Filled 2014-11-29: qty 1

## 2014-11-29 MED ORDER — ONDANSETRON HCL 4 MG/2ML IJ SOLN
4.0000 mg | Freq: Once | INTRAMUSCULAR | Status: AC
  Administered 2014-11-29: 4 mg via INTRAVENOUS
  Filled 2014-11-29: qty 2

## 2014-11-29 MED ORDER — DIPHENHYDRAMINE HCL 25 MG PO CAPS
25.0000 mg | ORAL_CAPSULE | Freq: Once | ORAL | Status: AC
  Administered 2014-11-29: 25 mg via ORAL
  Filled 2014-11-29: qty 1

## 2014-11-29 NOTE — ED Notes (Signed)
Upon Gerald Mourning RN attempting to discharge [name] he said he felt like he needed to be admitted because he was short of breath,  Lattie Haw PA  Aware,  Hold [name] until advised otherwise

## 2014-11-29 NOTE — Discharge Instructions (Signed)
Continue taking her home pain medications as directed. Follow-up with your primary care physician. Return to the ED for new or worsening symptoms.

## 2014-11-29 NOTE — ED Provider Notes (Signed)
CSN: [id number]     Arrival date & time 11/29/14  1633 History   First MD Initiated Contact with Patient 11/29/14 1712     Chief Complaint  Patient presents with  . Sickle Cell Pain Crisis     (Consider location/radiation/quality/duration/timing/severity/associated sxs/prior Treatment) The history is provided by the patient and medical records.    34 year old male with history of sickle cell anemia, PE on Lovenox, avascular necrosis of hips, hypertension, mood disorder, presenting to the ED for sickle cell pain crisis.   Patient was just discharged from inpatient stay yesterday afternoon and he returns today due to recurrent pain. Current pain localized to bilateral ribs, described as aching/throbbing. He denies any cough, fever, chest pain, shortness of breath. Patient takes home Dilaudid, hydroxyurea-- has taken doses at home without relief. Patient is on Lovenox for PE, states he is not mass any doses. Of note, O2 sats were low on arrival at 86%. Patient is supposed to be on 2 L oxygen at all times, he states he has not been using it today as he needs to "change out his tank" which he has not done yet.  Past Medical History  Diagnosis Date  . Sickle cell anemia (HCC)   . Blood transfusion   . Acute embolism and thrombosis of right internal jugular vein ([place])   . Hypokalemia   . Mood disorder ([place])   . History of pulmonary embolus (PE)   . Avascular necrosis ([place])   . Leukocytosis     Chronic  . Thrombocytosis (HCC)     Chronic  . Hypertension   . History of Clostridium difficile infection   . Uses marijuana   . Chronic anticoagulation   . Functional asplenia   . Former smoker   . Second hand tobacco smoke exposure   . Alcohol consumption of one to four drinks per day   . Noncompliance with medication regimen   . Sickle-cell crisis with associated acute chest syndrome ([place]) 05/13/2013  . Acute chest syndrome ([place]) 06/18/2013  . Demand ischemia ([place]) 01/02/2014   Past Surgical  History  Procedure Laterality Date  . Right hip replacement      08/2006  . Cholecystectomy      01/2008  . Porta cath placement    . Porta cath removal    . Umbilical hernia repair      01/2008  . Excision of left periauricular cyst      10/2009  . Excision of right ear lobe cyst with primary closur      11/2007  . Portacath placement  01/05/2012    Procedure: INSERTION PORT-A-CATH;  Surgeon: Odis Hollingshead, MD;  Location: [facility];  Service: General;  Laterality: N/A;  ultrasound guiced port a cath insertion with fluoroscopy   Family History  Problem Relation Age of Onset  . Sickle cell trait Mother   . Depression Mother   . Diabetes Mother   . Sickle cell trait Father   . Sickle cell trait Brother    Social History  Substance Use Topics  . Smoking status: Former Smoker -- 13 years  . Smokeless tobacco: Never Used  . Alcohol Use: No    Review of Systems  Constitutional: Negative for fever, chills and fatigue.  Respiratory: Negative for cough and shortness of breath.   Cardiovascular: Positive for chest pain (ribs bilaterally).  All other systems reviewed and are negative.     Allergies  Review of patient's allergies indicates no known allergies.  Home Medications  Prior to Admission medications   Medication Sig Start Date End Date Taking? Authorizing Provider  aspirin 81 MG chewable tablet Chew 1 tablet (81 mg total) by mouth daily. Patient not taking: Reported on 11/18/2014 07/23/14   [name], MD  Cholecalciferol (VITAMIN D) 2000 UNITS tablet Take 1 tablet (2,000 Units total) by mouth daily. 02/13/14   [name], MD  enoxaparin (LOVENOX) 150 MG/ML injection Inject 0.73 mLs (110 mg total) into the skin daily. 09/26/14   Micheline Chapman, NP  folic acid (FOLVITE) 1 MG tablet Take 1 tablet (1 mg total) by mouth every morning. 10/08/13   [name], MD  HYDROmorphone (DILAUDID) 4 MG tablet Take 1 tablet (4 mg total) by mouth every 4 (four)  hours as needed for severe pain. 11/06/14   Dorena Dew, FNP  hydroxyurea (HYDREA) 500 MG capsule Take 1 capsule (500 mg total) by mouth daily. May take with food to minimize GI side effects. 11/06/14   Dorena Dew, FNP  lisinopril (PRINIVIL,ZESTRIL) 10 MG tablet Take 1 tablet (10 mg total) by mouth daily. 01/22/14   [name], MD  metoprolol succinate (TOPROL-XL) 50 MG 24 hr tablet Take 1 tablet (50 mg total) by mouth daily. 11/28/14   [name], MD  morphine (MS CONTIN) 30 MG 12 hr tablet Take 1 tablet (30 mg total) by mouth every 12 (twelve) hours. 11/18/14   Tresa Garter, MD  potassium chloride SA (K-DUR,KLOR-CON) 20 MEQ tablet Take 1 tablet (20 mEq total) by mouth every morning. 06/09/14   [name], MD  zolpidem (AMBIEN) 10 MG tablet Take 1 tablet (10 mg total) by mouth at bedtime as needed for sleep. 10/23/14   Tresa Garter, MD   BP 129/84 mmHg  Pulse 88  Temp(Src) 98.7 F (37.1 C) (Oral)  Resp 18  SpO2 93%   Physical Exam  Constitutional: He is oriented to person, place, and time. He appears well-developed and well-nourished. No distress.  HENT:  Head: Normocephalic and atraumatic.  Mouth/Throat: Oropharynx is clear and moist.  Eyes: Conjunctivae and EOM are normal. Pupils are equal, round, and reactive to light.  Scleral icterus noted  Neck: Normal range of motion.  Cardiovascular: Normal rate, regular rhythm and normal heart sounds.   Pulmonary/Chest: Effort normal and breath sounds normal. No respiratory distress. He has no wheezes.  2L via [place] in use; no distress, lungs clear, speaking in full sentences without difficulty, O2 sats 93%  Abdominal: Soft. Bowel sounds are normal. There is no tenderness. There is no guarding and no CVA tenderness.  Musculoskeletal: Normal range of motion.  Neurological: He is alert and oriented to person, place, and time.  Skin: Skin is warm and dry. He is not diaphoretic.  Psychiatric: He has a  normal mood and affect.  Nursing note and vitals reviewed.   ED Course  Procedures (including critical care time) Labs Review Labs Reviewed  CBC WITH DIFFERENTIAL/PLATELET - Abnormal; Notable for the following:    WBC 14.3 (*)    RBC 2.53 (*)    Hemoglobin 7.9 (*)    HCT 22.2 (*)    RDW 20.3 (*)    Platelets 413 (*)    Neutro Abs 10.0 (*)    Monocytes Absolute 2.1 (*)    All other components within normal limits  RETICULOCYTES - Abnormal; Notable for the following:    Retic Ct Pct 20.4 (*)    RBC. 2.53 (*)  Retic Count, Manual 516.1 (*)    All other components within normal limits  COMPREHENSIVE METABOLIC PANEL - Abnormal; Notable for the following:    Glucose, Bld 102 (*)    Creatinine, Ser 0.51 (*)    AST 43 (*)    Total Bilirubin 7.9 (*)    All other components within normal limits    Imaging Review No results found. I have personally reviewed and evaluated these images and lab results as part of my medical decision-making.   EKG Interpretation None      MDM   Final diagnoses:  Sickle cell pain crisis ([place])   34 year old male here with sickle cell pain crisis. He was just released from inpatient service yesterday after admission for the same. He reports continued pain in his ribs bilaterally. Denies chest pain or shortness of breath. He has not been wearing his home oxygen as directed which likely accounts for his hypoxia on room air.  Repeat lab work obtained today which is improved from prior values.  Patient was given multiple rounds of Dilaudid as well as Benadryl and Toradol with improvement of his pain, states 5/10 currently and he feels he can manage at home.  Vital signs overall remained stable on his baseline 2 L-- he does have desaturations when removed from O2 which is expected.  He is without any signs of respiratory distress. He has no chest pain, cough, fever, or other symptoms to suggest acute chest syndrome.   Given that his pain is controlled and  labs improving, I feel he is stable for discharge. I have encouraged him to wear his oxygen as directed, continue home pain meds.  FU with PCP.  Discussed plan with patient, he/she acknowledged understanding and agreed with plan of care.  Return precautions given for new or worsening symptoms.  Larene Pickett, PA-C 11/29/14 2149  Larene Pickett, PA-C 11/29/14 8768  Tanna Furry, MD 12/17/14 [phone]

## 2014-11-29 NOTE — ED Notes (Signed)
Pt reports sickle cell pain crisis starting this morning, bilateral rib pain, pain 8/10. Pt 86% on room air, pt is suppose to wear oxygen at home, but does not all the time. 2L [place] applied. 93% now.

## 2014-11-30 ENCOUNTER — Emergency Department (HOSPITAL_COMMUNITY): Payer: Medicare Other

## 2014-11-30 ENCOUNTER — Inpatient Hospital Stay (HOSPITAL_COMMUNITY)
Admission: EM | Admit: 2014-11-30 | Discharge: 2014-12-03 | Disposition: A | Discharge status: Home / Self Care | Payer: Medicare Other | Source: Home / Self Care | Attending: Internal Medicine | Admitting: Internal Medicine

## 2014-11-30 ENCOUNTER — Encounter (HOSPITAL_COMMUNITY): Payer: Self-pay | Admitting: *Deleted

## 2014-11-30 DIAGNOSIS — D5700 Hb-SS disease with crisis, unspecified: Secondary | ICD-10-CM | POA: Diagnosis present

## 2014-11-30 DIAGNOSIS — I2690 Septic pulmonary embolism without acute cor pulmonale: Secondary | ICD-10-CM

## 2014-11-30 DIAGNOSIS — R55 Syncope and collapse: Secondary | ICD-10-CM | POA: Diagnosis present

## 2014-11-30 DIAGNOSIS — E869 Volume depletion, unspecified: Secondary | ICD-10-CM | POA: Diagnosis not present

## 2014-11-30 DIAGNOSIS — J9601 Acute respiratory failure with hypoxia: Secondary | ICD-10-CM

## 2014-11-30 DIAGNOSIS — I272 Other secondary pulmonary hypertension: Secondary | ICD-10-CM

## 2014-11-30 DIAGNOSIS — I2699 Other pulmonary embolism without acute cor pulmonale: Secondary | ICD-10-CM

## 2014-11-30 DIAGNOSIS — R1111 Vomiting without nausea: Secondary | ICD-10-CM

## 2014-11-30 DIAGNOSIS — J9611 Chronic respiratory failure with hypoxia: Secondary | ICD-10-CM | POA: Diagnosis not present

## 2014-11-30 HISTORY — DX: Pulmonary hypertension, unspecified: I27.20

## 2014-11-30 LAB — COMPREHENSIVE METABOLIC PANEL
ALT: 23 U/L (ref 17–63)
AST: 45 U/L — ABNORMAL HIGH (ref 15–41)
Albumin: 3.8 g/dL (ref 3.5–5.0)
Alkaline Phosphatase: 82 U/L (ref 38–126)
Anion gap: 5 (ref 5–15)
BUN: 11 mg/dL (ref 6–20)
CHLORIDE: 110 mmol/L (ref 101–111)
CO2: 22 mmol/L (ref 22–32)
CREATININE: 0.69 mg/dL (ref 0.61–1.24)
Calcium: 8.8 mg/dL — ABNORMAL LOW (ref 8.9–10.3)
Glucose, Bld: 117 mg/dL — ABNORMAL HIGH (ref 65–99)
POTASSIUM: 3.5 mmol/L (ref 3.5–5.1)
SODIUM: 137 mmol/L (ref 135–145)
Total Bilirubin: 7.2 mg/dL — ABNORMAL HIGH (ref 0.3–1.2)
Total Protein: 7.2 g/dL (ref 6.5–8.1)

## 2014-11-30 LAB — RETICULOCYTES
RBC.: 2.64 MIL/uL — ABNORMAL LOW (ref 4.22–5.81)
RETIC CT PCT: 21.8 % — AB (ref 0.4–3.1)
Retic Count, Absolute: 575.5 10*3/uL — ABNORMAL HIGH (ref 19.0–186.0)

## 2014-11-30 LAB — I-STAT TROPONIN, ED: TROPONIN I, POC: 0.3 ng/mL — AB (ref 0.00–0.08)

## 2014-11-30 LAB — CBC WITH DIFFERENTIAL/PLATELET
Basophils Absolute: 0.1 10*3/uL (ref 0.0–0.1)
Basophils Relative: 1 %
EOS ABS: 0.4 10*3/uL (ref 0.0–0.7)
Eosinophils Relative: 3 %
HEMATOCRIT: 23.1 % — AB (ref 39.0–52.0)
HEMOGLOBIN: 8.2 g/dL — AB (ref 13.0–17.0)
LYMPHS ABS: 2 10*3/uL (ref 0.7–4.0)
LYMPHS PCT: 13 %
MCH: 31.1 pg (ref 26.0–34.0)
MCHC: 35.5 g/dL (ref 30.0–36.0)
MCV: 87.5 fL (ref 78.0–100.0)
MONOS PCT: 12 %
Monocytes Absolute: 1.9 10*3/uL — ABNORMAL HIGH (ref 0.1–1.0)
NEUTROS ABS: 11.2 10*3/uL — AB (ref 1.7–7.7)
NEUTROS PCT: 72 %
Platelets: 427 10*3/uL — ABNORMAL HIGH (ref 150–400)
RBC: 2.64 MIL/uL — AB (ref 4.22–5.81)
RDW: 19.5 % — ABNORMAL HIGH (ref 11.5–15.5)
WBC: 15.5 10*3/uL — AB (ref 4.0–10.5)

## 2014-11-30 LAB — MAGNESIUM: Magnesium: 1.5 mg/dL — ABNORMAL LOW (ref 1.7–2.4)

## 2014-11-30 LAB — I-STAT CHEM 8, ED
BUN: 11 mg/dL (ref 6–20)
Calcium, Ion: 1.21 mmol/L (ref 1.12–1.23)
Chloride: 106 mmol/L (ref 101–111)
Creatinine, Ser: 0.8 mg/dL (ref 0.61–1.24)
GLUCOSE: 114 mg/dL — AB (ref 65–99)
HEMATOCRIT: 25 % — AB (ref 39.0–52.0)
HEMOGLOBIN: 8.5 g/dL — AB (ref 13.0–17.0)
POTASSIUM: 3.5 mmol/L (ref 3.5–5.1)
Sodium: 141 mmol/L (ref 135–145)
TCO2: 21 mmol/L (ref 0–100)

## 2014-11-30 LAB — BRAIN NATRIURETIC PEPTIDE: B Natriuretic Peptide: 639.7 pg/mL — ABNORMAL HIGH (ref 0.0–100.0)

## 2014-11-30 MED ORDER — HYDROMORPHONE HCL 2 MG/ML IJ SOLN
2.0000 mg | INTRAMUSCULAR | Status: AC | PRN
  Administered 2014-11-30 (×3): 2 mg via INTRAVENOUS
  Filled 2014-11-30 (×3): qty 1

## 2014-11-30 MED ORDER — VITAMIN D3 25 MCG (1000 UNIT) PO TABS
2000.0000 [IU] | ORAL_TABLET | Freq: Every day | ORAL | Status: DC
  Administered 2014-11-30 – 2014-12-03 (×4): 2000 [IU] via ORAL
  Filled 2014-11-30 (×4): qty 2

## 2014-11-30 MED ORDER — IOHEXOL 350 MG/ML SOLN
100.0000 mL | Freq: Once | INTRAVENOUS | Status: AC | PRN
  Administered 2014-11-30: 100 mL via INTRAVENOUS

## 2014-11-30 MED ORDER — POTASSIUM CHLORIDE CRYS ER 20 MEQ PO TBCR
20.0000 meq | EXTENDED_RELEASE_TABLET | Freq: Every morning | ORAL | Status: DC
  Administered 2014-11-30 – 2014-12-03 (×4): 20 meq via ORAL
  Filled 2014-11-30 (×4): qty 1

## 2014-11-30 MED ORDER — SODIUM CHLORIDE 0.9 % IJ SOLN: 9.0000 mL | INTRAMUSCULAR | Status: DC | PRN

## 2014-11-30 MED ORDER — SENNOSIDES-DOCUSATE SODIUM 8.6-50 MG PO TABS
1.0000 | ORAL_TABLET | Freq: Two times a day (BID) | ORAL | Status: DC
  Administered 2014-11-30 – 2014-12-03 (×7): 1 via ORAL
  Filled 2014-11-30 (×7): qty 1

## 2014-11-30 MED ORDER — ONDANSETRON HCL 4 MG/2ML IJ SOLN
4.0000 mg | Freq: Once | INTRAMUSCULAR | Status: AC
  Administered 2014-11-30: 4 mg via INTRAVENOUS
  Filled 2014-11-30: qty 2

## 2014-11-30 MED ORDER — ENOXAPARIN SODIUM 150 MG/ML ~~LOC~~ SOLN
110.0000 mg | SUBCUTANEOUS | Status: DC
  Administered 2014-11-30 – 2014-12-01 (×2): 110 mg via SUBCUTANEOUS
  Filled 2014-11-30 (×3): qty 1

## 2014-11-30 MED ORDER — MORPHINE SULFATE ER 30 MG PO TBCR
30.0000 mg | EXTENDED_RELEASE_TABLET | Freq: Two times a day (BID) | ORAL | Status: DC
Start: 2014-11-30 — End: 2014-12-03
  Administered 2014-11-30 – 2014-12-03 (×7): 30 mg via ORAL
  Filled 2014-11-30 (×7): qty 1

## 2014-11-30 MED ORDER — SODIUM CHLORIDE 0.9 % IV BOLUS (SEPSIS)
1000.0000 mL | Freq: Once | INTRAVENOUS | Status: AC
  Administered 2014-11-30: 1000 mL via INTRAVENOUS

## 2014-11-30 MED ORDER — HYDROMORPHONE 1 MG/ML IV SOLN
INTRAVENOUS | Status: DC
  Administered 2014-11-30: 5.4 mg via INTRAVENOUS
  Administered 2014-11-30: 14:00:00 via INTRAVENOUS
  Administered 2014-11-30: 3 mg via INTRAVENOUS
  Administered 2014-11-30 – 2014-12-01 (×2): 6.6 mg via INTRAVENOUS
  Administered 2014-12-01: 0 mg via INTRAVENOUS
  Administered 2014-12-01: 5.4 mg via INTRAVENOUS
  Administered 2014-12-01: 2.4 mg via INTRAVENOUS
  Administered 2014-12-01: 10:00:00 via INTRAVENOUS
  Administered 2014-12-01 – 2014-12-02 (×2): 8.4 mg via INTRAVENOUS
  Administered 2014-12-02: 01:00:00 via INTRAVENOUS
  Administered 2014-12-02: 6 mg via INTRAVENOUS
  Administered 2014-12-02: 2.4 mg via INTRAVENOUS
  Administered 2014-12-02: 4.8 mg via INTRAVENOUS
  Administered 2014-12-02: 4.2 mg via INTRAVENOUS
  Administered 2014-12-02: 7.8 mg via INTRAVENOUS
  Administered 2014-12-03: 09:00:00 via INTRAVENOUS
  Administered 2014-12-03: 9 mg via INTRAVENOUS
  Administered 2014-12-03: 4.8 mg via INTRAVENOUS
  Administered 2014-12-03: 7.8 mg via INTRAVENOUS
  Administered 2014-12-03: 7.2 mg via INTRAVENOUS
  Filled 2014-11-30 (×6): qty 25

## 2014-11-30 MED ORDER — SODIUM CHLORIDE 0.9 % IV SOLN
25.0000 mg | INTRAVENOUS | Status: DC | PRN
  Administered 2014-11-30 – 2014-12-03 (×5): 25 mg via INTRAVENOUS
  Filled 2014-11-30 (×12): qty 0.5

## 2014-11-30 MED ORDER — ZOLPIDEM TARTRATE 10 MG PO TABS
10.0000 mg | ORAL_TABLET | Freq: Every evening | ORAL | Status: DC | PRN
  Administered 2014-11-30: 10 mg via ORAL
  Filled 2014-11-30 (×2): qty 1

## 2014-11-30 MED ORDER — DIPHENHYDRAMINE HCL 25 MG PO CAPS
25.0000 mg | ORAL_CAPSULE | Freq: Four times a day (QID) | ORAL | Status: DC | PRN
  Filled 2014-11-30: qty 1

## 2014-11-30 MED ORDER — METOPROLOL SUCCINATE ER 50 MG PO TB24
50.0000 mg | ORAL_TABLET | Freq: Every day | ORAL | Status: DC
  Administered 2014-11-30 – 2014-12-03 (×4): 50 mg via ORAL
  Filled 2014-11-30 (×4): qty 1

## 2014-11-30 MED ORDER — SODIUM CHLORIDE 0.9 % IJ SOLN
3.0000 mL | Freq: Two times a day (BID) | INTRAMUSCULAR | Status: DC
Start: 2014-11-30 — End: 2014-12-03
  Administered 2014-12-01 – 2014-12-02 (×2): 3 mL via INTRAVENOUS

## 2014-11-30 MED ORDER — LISINOPRIL 10 MG PO TABS
10.0000 mg | ORAL_TABLET | Freq: Every day | ORAL | Status: DC
  Administered 2014-11-30 – 2014-12-03 (×4): 10 mg via ORAL
  Filled 2014-11-30 (×4): qty 1

## 2014-11-30 MED ORDER — HYDROMORPHONE HCL 2 MG/ML IJ SOLN: 2.0000 mg | INTRAMUSCULAR | Status: DC | PRN

## 2014-11-30 MED ORDER — ONDANSETRON HCL 4 MG/2ML IJ SOLN: 4.0000 mg | Freq: Four times a day (QID) | INTRAMUSCULAR | Status: DC | PRN

## 2014-11-30 MED ORDER — ASPIRIN 81 MG PO CHEW
81.0000 mg | CHEWABLE_TABLET | Freq: Every day | ORAL | Status: DC
  Administered 2014-11-30 – 2014-12-03 (×4): 81 mg via ORAL
  Filled 2014-11-30 (×4): qty 1

## 2014-11-30 MED ORDER — DIPHENHYDRAMINE HCL 25 MG PO CAPS
25.0000 mg | ORAL_CAPSULE | Freq: Once | ORAL | Status: AC
  Administered 2014-11-30: 25 mg via ORAL
  Filled 2014-11-30: qty 1

## 2014-11-30 MED ORDER — KETOROLAC TROMETHAMINE 30 MG/ML IJ SOLN
30.0000 mg | Freq: Four times a day (QID) | INTRAMUSCULAR | Status: DC
  Administered 2014-11-30 – 2014-12-03 (×13): 30 mg via INTRAVENOUS
  Filled 2014-11-30 (×19): qty 1

## 2014-11-30 MED ORDER — FOLIC ACID 1 MG PO TABS
1.0000 mg | ORAL_TABLET | Freq: Every morning | ORAL | Status: DC
  Administered 2014-11-30 – 2014-12-03 (×4): 1 mg via ORAL
  Filled 2014-11-30 (×4): qty 1

## 2014-11-30 MED ORDER — POLYETHYLENE GLYCOL 3350 17 G PO PACK: 17.0000 g | PACK | Freq: Every day | ORAL | Status: DC | PRN

## 2014-11-30 MED ORDER — SODIUM CHLORIDE 0.9 % IJ SOLN
10.0000 mL | INTRAMUSCULAR | Status: DC | PRN
  Administered 2014-12-02 (×2): 10 mL
  Filled 2014-11-30 (×2): qty 40

## 2014-11-30 MED ORDER — HYDROXYUREA 500 MG PO CAPS
500.0000 mg | ORAL_CAPSULE | Freq: Every day | ORAL | Status: DC
  Administered 2014-11-30 – 2014-12-03 (×4): 500 mg via ORAL
  Filled 2014-11-30 (×4): qty 1

## 2014-11-30 MED ORDER — DEXTROSE-NACL 5-0.45 % IV SOLN
INTRAVENOUS | Status: DC
  Administered 2014-11-30 – 2014-12-03 (×5): via INTRAVENOUS

## 2014-11-30 MED ORDER — DIPHENHYDRAMINE HCL 25 MG PO CAPS
ORAL_CAPSULE | ORAL | Status: AC
  Filled 2014-11-30: qty 1

## 2014-11-30 MED ORDER — NALOXONE HCL 0.4 MG/ML IJ SOLN: 0.4000 mg | INTRAMUSCULAR | Status: DC | PRN

## 2014-11-30 NOTE — ED Notes (Signed)
Pt discharged by [facility]

## 2014-11-30 NOTE — Consult Note (Signed)
Name: [name] MRN: [id number] DOB: 30-Aug-1980    ADMISSION DATE:  11/30/2014 CONSULTATION DATE:  11/30/2014  REFERRING MD :  Liston Alba, M.D.  CHIEF COMPLAINT:  Acute PE & Pulmonary Hypertension  BRIEF PATIENT DESCRIPTION: 34 year old African male with known sickle cell disease on chronic and coagulation with Lovenox once daily for pulmonary emboli. Patient also has known pulmonary hypertension and recent admission.  SIGNIFICANT EVENTS  10/28 - Discharge from Hospital 10/30 - Readmit to Hospital  STUDIES:  CTA CHEST 10/30 - Personally reviewed by me. No pathologic mediastinal adenopathy. No pleural or pericardial effusion. No pleural thickening. There is some motion artifact but no obvious parenchymal nodule or opacity. Filling defect in left upper lobe pulmonary artery consistent with embolus.  TTE (08/25/14): Mild LVH. EF 55-65%. No regional wall motion abnormalities. LA normal in size. RA severely dilated. RV severely dilated. Moderate her band is prominent. Systolic function mildly reduced. Trivial aortic regurg without stenosis. No mitral stenosis or regurg. No pulmonic regurg. Moderate-severe tricuspid regurg.  HISTORY OF PRESENT ILLNESS:  34 year old male with known history of sickle cell disease and pulmonary emboli. Patient is on chronic systemic anticoagulation with Lovenox. Patient reports that he had a syncopal event 1-2 months ago. He denies any recent syncope or near syncope. This is contrary to the report to Dr. Zigmund Daniel previously. Patient does endorse nausea and vomiting of yellow emesis 2-3 times yesterday. Denies any diarrhea or abdominal pain. He was experiencing chest "tightness" that he rated as 5/10 previously which has since resolved. Currently his only pain is centered around his back and flanks that he rates as 8/10. He describes the pain as "throbbing". Patient denies any sore throat or sinus congestion. Patient denies any recent sick contacts. He  denies any dyspnea. Of note the patient was out of his oxygen at home.  PAST MEDICAL HISTORY :  Past Medical History  Diagnosis Date  . Sickle cell anemia (HCC)   . Blood transfusion   . Acute embolism and thrombosis of right internal jugular vein ([place])   . Hypokalemia   . Mood disorder ([place])   . History of pulmonary embolus (PE)   . Avascular necrosis (HCC)     Right Hip  . Leukocytosis     Chronic  . Thrombocytosis (HCC)     Chronic  . Hypertension   . History of Clostridium difficile infection   . Uses marijuana   . Chronic anticoagulation   . Functional asplenia   . Former smoker   . Second hand tobacco smoke exposure   . Alcohol consumption of one to four drinks per day   . Noncompliance with medication regimen   . Sickle-cell crisis with associated acute chest syndrome ([place]) 05/13/2013  . Acute chest syndrome ([place]) 06/18/2013  . Demand ischemia ([place]) 01/02/2014  . Pulmonary hypertension ([place])    PAST SURGICAL HISTORY: Past Surgical History  Procedure Laterality Date  . Right hip replacement      08/2006  . Cholecystectomy      01/2008  . Porta cath placement    . Porta cath removal    . Umbilical hernia repair      01/2008  . Excision of left periauricular cyst      10/2009  . Excision of right ear lobe cyst with primary closur      11/2007  . Portacath placement  01/05/2012    Procedure: INSERTION PORT-A-CATH;  Surgeon: Odis Hollingshead, MD;  Location: [facility];  Service: General;  Laterality: N/A;  ultrasound guiced port a cath insertion with fluoroscopy    Prior to Admission medications   Medication Sig Start Date End Date Taking? Authorizing Provider  aspirin 81 MG chewable tablet Chew 1 tablet (81 mg total) by mouth daily. 07/23/14  Yes [name], MD  Cholecalciferol (VITAMIN D) 2000 UNITS tablet Take 1 tablet (2,000 Units total) by mouth daily. 02/13/14  Yes [name], MD  enoxaparin (LOVENOX) 150 MG/ML injection Inject 0.73 mLs (110 mg  total) into the skin daily. 09/26/14  Yes Micheline Chapman, NP  folic acid (FOLVITE) 1 MG tablet Take 1 tablet (1 mg total) by mouth every morning. 10/08/13  Yes [name], MD  HYDROmorphone (DILAUDID) 4 MG tablet Take 1 tablet (4 mg total) by mouth every 4 (four) hours as needed for severe pain. 11/06/14  Yes Dorena Dew, FNP  hydroxyurea (HYDREA) 500 MG capsule Take 1 capsule (500 mg total) by mouth daily. May take with food to minimize GI side effects. 11/06/14  Yes Dorena Dew, FNP  lisinopril (PRINIVIL,ZESTRIL) 10 MG tablet Take 1 tablet (10 mg total) by mouth daily. 01/22/14  Yes [name], MD  metoprolol succinate (TOPROL-XL) 50 MG 24 hr tablet Take 1 tablet (50 mg total) by mouth daily. 11/28/14  Yes [name], MD  morphine (MS CONTIN) 30 MG 12 hr tablet Take 1 tablet (30 mg total) by mouth every 12 (twelve) hours. 11/18/14  Yes Tresa Garter, MD  potassium chloride SA (K-DUR,KLOR-CON) 20 MEQ tablet Take 1 tablet (20 mEq total) by mouth every morning. 06/09/14  Yes [name], MD  zolpidem (AMBIEN) 10 MG tablet Take 1 tablet (10 mg total) by mouth at bedtime as needed for sleep. 10/23/14  Yes Olugbemiga Essie Christine, MD   No Known Allergies  FAMILY HISTORY:  Family History  Problem Relation Age of Onset  . Sickle cell trait Mother   . Depression Mother   . Diabetes Mother   . Sickle cell trait Father   . Sickle cell trait Brother    SOCIAL HISTORY: Social History   Social History  . Marital Status: Single    Spouse Name: N/A  . Number of Children: 0  . Years of Education: 13   Occupational History  . 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 Smoking status: Former Smoker -- 13 years  . Smokeless tobacco: Never Used  . Alcohol Use: No  . Drug Use: 2.00 per week    Special: Marijuana     Comment: Marijuana weekly  . Sexual Activity:    Partners: Female    Music therapist: None     Comment: month ago   Other Topics Concern  . None   Social History Narrative   Lives in an apartment.  Single.  Lives alone but has a girlfriend that helps care for him.  Does not use any assist devices.        [name]:  [phone] Mom, emergency contact      [facility]:   Patient continuing to live in her apartment in town alone. 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    REVIEW OF SYSTEMS:  No rashes or abnormal bruising. No lymphadenopathy in his neck, groin, or axilla. A pertinent 14 point review of systems is negative except as per the history of presenting illness.  SUBJECTIVE:  VITAL SIGNS: Temp:  [98.5 F (36.9 C)-99 F (37.2 C)] 99 F (37.2 C) (10/30 0900) Pulse Rate:  [58-97] 97 (10/30 0837) Resp:  [13-24] 19 (10/30 1231) BP: (108-129)/(65-84) 116/65 mmHg (10/30 1231) SpO2:  [79 %-98 %] 98 % (10/30 0900) Weight:  [165 lb (74.844 kg)] 165 lb (74.844 kg) (10/30 0856)  PHYSICAL EXAMINATION: General:  Awake. Alert. No acute distress. Sitting in bed watching TV.  Integument:  Warm & dry. No rash on exposed skin. No bruising. Left anterior chest IJ port accessed. Lymphatics:  No appreciated cervical or supraclavicular lymphadenoapthy. HEENT:  Moist mucus membranes. No oral ulcers. No scleral injection. PERRL. Cardiovascular:  Regular rate. No edema. No appreciable JVD.  Pulmonary:  Good aeration & clear to auscultation bilaterally. Symmetric chest wall expansion. No accessory muscle use on nasal cannula oxygen. Abdomen: Soft. Normal bowel sounds. Nondistended. Grossly nontender. Musculoskeletal:  Normal bulk and tone. Hand grip strength 5/5 bilaterally. No joint deformity or effusion appreciated. Neurological:  CN 2-12 grossly in tact. No meningismus. Moving all 4 extremities equally. Symmetric brachioradialis deep tendon reflexes. Psychiatric:  Mood and affect congruent. Speech normal rhythm, rate & tone.    Recent Labs Lab  11/25/14 1030 11/29/14 1740 11/30/14 0910 11/30/14 0918  NA 140 136 137 141  K 3.7 3.6 3.5 3.5  CL 115* 108 110 106  CO2 19* 22 22  --   BUN 11 7 11 11   CREATININE 0.67 0.51* 0.69 0.80  GLUCOSE 115* 102* 117* 114*    Recent Labs Lab 11/28/14 1020 11/29/14 1740 11/30/14 0910 11/30/14 0918  HGB 7.4* 7.9* 8.2* 8.5*  HCT 20.5* 22.2* 23.1* 25.0*  WBC 15.4* 14.3* 15.5*  --   PLT 356 413* 427*  --    Ct Angio Chest W/cm &/or Wo Cm  11/30/2014  CLINICAL DATA:  Shortness of breath. EXAM: CT ANGIOGRAPHY CHEST WITH CONTRAST TECHNIQUE: Multidetector CT imaging of the chest was performed using the standard protocol during bolus administration of intravenous contrast. Multiplanar CT image reconstructions and MIPs were obtained to evaluate the vascular anatomy. CONTRAST:  146mL OMNIPAQUE IOHEXOL 350 MG/ML SOLN COMPARISON:  10/26/2014 FINDINGS: Mediastinum: Overall examination is suboptimal. Poor pulmonary arterial opacification and respiratory motion artifact diminishes exam detail. The heart size is moderately enlarged. No pericardial effusion. Prominent anterior mediastinal lymph nodes are again noted, unchanged from previous exam compatible with adenopathy. The trachea appears patent and is midline. Unremarkable appearance of the esophagus. Left upper lobe segmental pulmonary artery filling defect is identified, image 88 of series 14. Lungs/Pleura: No pleural effusion identified. There is atelectasis noted in both lung bases. No airspace consolidation. Upper Abdomen: The visualized portions of the liver are diffusely increased and attenuation. Calcified spleen noted. Musculoskeletal: Bony stigmata of sickle cell disease identified. Review of the MIP images confirms the above findings. IMPRESSION: 1. Exam detail diminished secondary to respiratory motion artifact and poor pulmonary arterial opacification. 2. Examination is positive for at least 1 segmental branch pulmonary artery filling defect  compatible with acute pulmonary embolus. 3. Chronic changes of sickle cell disease. Electronically Signed   By: Kerby Moors M.D.   On: 11/30/2014 10:28    ASSESSMENT / PLAN: 34 year old African male with sickle cell disease, pulmonary hypertension, chronic hypoxic respiratory failure, & chronic systemic anticoagulation recently discharged from hospital. I suspect the patient's syncopal event and current pain crisis were precipitated by hypoxia from lack of home oxygen. It's unclear to me how acute the pulmonary embolus is on his CT angiogram or  whether or not this truly was the cause for his syncopal event. The patient has no evidence of hemodynamic instability indicating that his right ventricular function is stable. Case was further discussed with Dr. Zigmund Daniel including the recommendations as follows.  1. Pulmonary embolus: Suspect this is subacute. Recommend continued therapeutic anticoagulation given his underlying right ventricular heart failure. Also recommend assessing for possible lower extremity clots as well as clot around left internal jugular central venous catheter. 2. Pulmonary hypertension: Multifactorial but the primary causes are due to underlying sickle cell disease as well as pulmonary emboli. Recommend continued systemic anticoagulation to prevent further emboli. Recommend against pulmonary arterial vasodilator therapy at this time. 3. Chronic hypoxic respiratory failure: Patient is essentially at his baseline oxygen requirement. Recommend continued maintenance of saturation greater than 94% to prevent pulmonary arterial vasospasm. 4. Prognosis: Patient's ultimate prognosis is dismal given his multiple medical problems and chronic illness from his sickle cell disease.  Sonia Baller Ashok Cordia, M.D. [facility] Pager:  [phone] After 3pm or if no response, call [phone] 11/30/2014, 12:51 PM

## 2014-11-30 NOTE — ED Notes (Signed)
DR and RN notified of critical I stat Troponin result

## 2014-11-30 NOTE — ED Notes (Signed)
Patient states he started feeling SOB around midnight, states he was unable to wear his oxygen last night because he did not stay at home.  Patient c/o mid chest pain.  Patient oxygen level 97% via nasal cannula @ 5 liters.[name]

## 2014-11-30 NOTE — H&P (Signed)
Hospital Admission Note Date: 11/30/2014  Patient name: [name] Medical record number: [id number] Date of birth: 15-Nov-1980 Age: 34 y.o. Gender: male PCP: [name], MD  Attending physician: [name], MD  Chief Complaint: Syncope, SOB and Emesis x 1 day. Oain in ribs x 1 dya.  History of Present Illness: Pt was discharged from the hospital 2 days ago with strict instructions to wear his Oxygen ATC. Pt states that when he arrived at home he realized that his Oxygen tank was empty. He states that he was feeling well at the time of discharge and for about 24 hours and then started feeling light headed. He had a syncopal episode which lasted just a few seconds and then had 2 episodes of emesis which were nonbilious and nonhematematous. He also noted that he felt as though he was having increased DOE. He denies missing any doses of Lovenox and reports compliance with medications except for the Oxygen. He states that since the episode he has had increased pain in his ribs that is consistent with that off a crisis. The pain is throbbing and sharp in nature and localized to his ribs. It is non-radiating and non-pleuritic in nature. He has no associated symptoms except the events noted above. Specifically he denies fever, chills, diarrhea, HA, abdominal pain.   In the ED he was noted to have Oxygen saturations of 98% on 5L of Oxygen.. After requesting decrease in Oxygen to 4 l/min his saturations were around 93%. Labs and radiological studies  done in the ED were reviewed and the only significant new finding is a small PE on CT angiogram and a very mildly elevated Troponin. I have discussed his case with Dr. Billy Fischer in the ED and patient is being admitted for observation.   Scheduled Meds:  Continuous Infusions:  PRN Meds:.HYDROmorphone (DILAUDID) injection Allergies: Review of patient's allergies indicates no known allergies. Past Medical History  Diagnosis Date  . Sickle  cell anemia (HCC)   . Blood transfusion   . Acute embolism and thrombosis of right internal jugular vein ([place])   . Hypokalemia   . Mood disorder ([place])   . History of pulmonary embolus (PE)   . Avascular necrosis ([place])   . Leukocytosis     Chronic  . Thrombocytosis (HCC)     Chronic  . Hypertension   . History of Clostridium difficile infection   . Uses marijuana   . Chronic anticoagulation   . Functional asplenia   . Former smoker   . Second hand tobacco smoke exposure   . Alcohol consumption of one to four drinks per day   . Noncompliance with medication regimen   . Sickle-cell crisis with associated acute chest syndrome ([place]) 05/13/2013  . Acute chest syndrome ([place]) 06/18/2013  . Demand ischemia ([place]) 01/02/2014   Past Surgical History  Procedure Laterality Date  . Right hip replacement      08/2006  . Cholecystectomy      01/2008  . Porta cath placement    . Porta cath removal    . Umbilical hernia repair      01/2008  . Excision of left periauricular cyst      10/2009  . Excision of right ear lobe cyst with primary closur      11/2007  . Portacath placement  01/05/2012    Procedure: INSERTION PORT-A-CATH;  Surgeon: Odis Hollingshead, MD;  Location: [facility];  Service: General;  Laterality: N/A;  ultrasound guiced port a cath insertion  with fluoroscopy   Family History  Problem Relation Age of Onset  . Sickle cell trait Mother   . Depression Mother   . Diabetes Mother   . Sickle cell trait Father   . Sickle cell trait Brother    Social History   Social History  . Marital Status: Single    Spouse Name: N/A  . Number of Children: 0  . Years of Education: 13   Occupational History  . 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 Smoking status: Former Smoker -- 13 years  . Smokeless tobacco: Never Used  . Alcohol Use: No  . Drug Use: 2.00 per week    Special: Marijuana  . Sexual Activity:    Partners: Female     Museum/gallery curator: None     Comment: month ago   Other Topics Concern  . Not on file   Social History Narrative   Lives in an apartment.  Single.  Lives alone but has a girlfriend that helps care for him.  Does not use any assist devices.        [name]:  [phone] Mom, emergency contact   Review of Systems: A comprehensive review of systems was negative. Physical Exam: No intake or output data in the 24 hours ending 11/30/14 1151 General: Alert, awake, oriented x3, in no acute distress.  HEENT: [name]/AT PEERL, EOMI, mild icterus. Neck: Trachea midline,  no masses, no thyromegal,y no JVD, no carotid bruit OROPHARYNX:  Moist, No exudate/ erythema/lesions.  Heart: II/VI Systolic Ejection Murmur noted at LSB, no rubs or gallops. \ Lungs: Clear to auscultation, no wheezing or rhonchi noted. No increased vocal fremitus resonant to percussion  Abdomen: Soft, nontender, nondistended, positive bowel sounds, no masses no hepatosplenomegaly noted..  Neuro: No focal neurological deficits noted cranial nerves II through XII grossly intact. Strength at functional baseline in bilateral upper and lower extremities. Musculoskeletal: No warm swelling or erythema around joints, no spinal tenderness noted. Psychiatric: Patient alert and oriented x3, good insight and cognition, good recent to remote recall. Lymph node survey: No cervical axillary or inguinal lymphadenopathy noted.  Lab results:  Recent Labs  11/29/14 1740 11/30/14 0910 11/30/14 0918  NA 136 137 141  K 3.6 3.5 3.5  CL 108 110 106  CO2 22 22  --   GLUCOSE 102* 117* 114*  BUN 7 11 11   CREATININE 0.51* 0.69 0.80  CALCIUM 9.0 8.8*  --     Recent Labs  11/29/14 1740 11/30/14 0910  AST 43* 45*  ALT 23 23  ALKPHOS 82 82  BILITOT 7.9* 7.2*  PROT 7.0 7.2  ALBUMIN 3.9 3.8   No results for input(s): LIPASE, AMYLASE in the last 72 hours.  Recent Labs  11/29/14 1740 11/30/14 0910 11/30/14 0918  WBC  14.3* 15.5*  --   NEUTROABS 10.0* 11.2*  --   HGB 7.9* 8.2* 8.5*  HCT 22.2* 23.1* 25.0*  MCV 87.7 87.5  --   PLT 413* 427*  --    No results for input(s): CKTOTAL, CKMB, CKMBINDEX, TROPONINI in the last 72 hours. Invalid input(s): POCBNP No results for input(s): DDIMER in the last 72 hours. No results for input(s): HGBA1C in the last 72 hours. No results for input(s): CHOL, HDL, LDLCALC, TRIG, CHOLHDL, LDLDIRECT in the last 72 hours. No results for input(s): TSH, T4TOTAL, T3FREE, THYROIDAB in the last 72 hours.  Invalid input(s): FREET3  Recent Labs  11/29/14 1740 11/30/14 0910  RETICCTPCT 20.4* 21.8*   Imaging results:  Dg Chest 2 View  11/26/2014  CLINICAL DATA:  Sickle cell crisis, shortness of breath. EXAM: CHEST  2 VIEW COMPARISON:  Chest x-ray of November 25, 2014 FINDINGS: The cardiopericardial silhouette remains enlarged. The pulmonary vascularity is less engorged today. The lungs are well-expanded. There is no focal infiltrate or pleural effusion. The power port appliance tip is stable projecting over the distal third of the SVC just proximal to the cavoatrial junction. There are stable coarse retrocardiac lung markings. There is multilevel degenerative disc disease of the thoracic spine. IMPRESSION: Improved appearance of the pulmonary vascularity and pulmonary interstitium consistent with resolution of pulmonary edema. Stable cardiomegaly. There is no evidence of pneumonia. Electronically Signed   By: David  Martinique M.D.   On: 11/26/2014 15:30   Dg Chest 2 View  11/15/2014  CLINICAL DATA:  Back pain.  Sickle cell anemia. EXAM: CHEST  2 VIEW COMPARISON:  11/13/2014 FINDINGS: Cardiomegaly. Left subclavian vein Port-A-Cath is stable. No pneumothorax. Chronic changes at the lung bases. IMPRESSION: No active cardiopulmonary disease. Electronically Signed   By: Marybelle Killings M.D.   On: 11/15/2014 08:52   Dg Chest 2 View  11/13/2014  CLINICAL DATA:  Sickle cell pain crisis with left  lateral chest wall pain EXAM: CHEST  2 VIEW COMPARISON:  10/26/2014 FINDINGS: Chronic cardiomegaly. Negative aortic and hilar contours. Stable left subclavian porta catheter placement, tip at the upper right atrium. Chronic interstitial coarsening consistent with scar. There is no edema, consolidation, effusion, or pneumothorax. No osseous findings to explain acute pain. Chronic humeral head osteonecrosis. Cholecystectomy. IMPRESSION: Stable lung scarring and cardiomegaly. No evidence of acute cardiopulmonary disease. Electronically Signed   By: Monte Fantasia M.D.   On: 11/13/2014 08:15   Dg Lumbar Spine Complete  11/15/2014  CLINICAL DATA:  Sickle cell pain crisis EXAM: LUMBAR SPINE - COMPLETE 4+ VIEW COMPARISON:  None. FINDINGS: Five lumbar type vertebral bodies. Straightening of the lumbar spine. No evidence of fracture or dislocation. Vertebral body heights are maintained. Mild superior/ inferior endplate changes at L5 characteristic of sickle cell. Visualized bony pelvis appears intact. IMPRESSION: No fracture or dislocation is seen. Electronically Signed   By: Julian Hy M.D.   On: 11/15/2014 11:22   Dg Pelvis 1-2 Views  11/15/2014  CLINICAL DATA:  Sickle cell pain crisis EXAM: PELVIS - 1-2 VIEW COMPARISON:  CT right hip dated 10/27/2013 FINDINGS: Right hip arthroplasty in satisfactory position. No fracture or dislocation is seen. Left hip joint space is preserved. Visualized bony pelvis appears intact. IMPRESSION: Right hip arthroplasty satisfactory position. No fracture or dislocation is seen. Electronically Signed   By: Julian Hy M.D.   On: 11/15/2014 11:19   Ct Angio Chest W/cm &/or Wo Cm  11/30/2014  CLINICAL DATA:  Shortness of breath. EXAM: CT ANGIOGRAPHY CHEST WITH CONTRAST TECHNIQUE: Multidetector CT imaging of the chest was performed using the standard protocol during bolus administration of intravenous contrast. Multiplanar CT image reconstructions and MIPs were obtained  to evaluate the vascular anatomy. CONTRAST:  1106mL OMNIPAQUE IOHEXOL 350 MG/ML SOLN COMPARISON:  10/26/2014 FINDINGS: Mediastinum: Overall examination is suboptimal. Poor pulmonary arterial opacification and respiratory motion artifact diminishes exam detail. The heart size is moderately enlarged. No pericardial effusion. Prominent anterior mediastinal lymph nodes are again noted, unchanged from previous exam compatible with adenopathy. The trachea appears patent and is midline. Unremarkable appearance of the esophagus. Left upper lobe segmental pulmonary artery  filling defect is identified, image 88 of series 14. Lungs/Pleura: No pleural effusion identified. There is atelectasis noted in both lung bases. No airspace consolidation. Upper Abdomen: The visualized portions of the liver are diffusely increased and attenuation. Calcified spleen noted. Musculoskeletal: Bony stigmata of sickle cell disease identified. Review of the MIP images confirms the above findings. IMPRESSION: 1. Exam detail diminished secondary to respiratory motion artifact and poor pulmonary arterial opacification. 2. Examination is positive for at least 1 segmental branch pulmonary artery filling defect compatible with acute pulmonary embolus. 3. Chronic changes of sickle cell disease. Electronically Signed   By: Kerby Moors M.D.   On: 11/30/2014 10:28   Ct Angio Chest Pe W/cm &/or Wo Cm  11/13/2014  CLINICAL DATA:  Chest pain.  Sickle cell disease EXAM: CT ANGIOGRAPHY CHEST WITH CONTRAST TECHNIQUE: Multidetector CT imaging of the chest was performed using the standard protocol during bolus administration of intravenous contrast. Multiplanar CT image reconstructions and MIPs were obtained to evaluate the vascular anatomy. CONTRAST:  125mL OMNIPAQUE IOHEXOL 350 MG/ML SOLN COMPARISON:  Chest CT October 26, 2014 and November 13, 2014 chest radiograph FINDINGS: There is a small pulmonary embolus in the posterior segment right lower lobe  pulmonary artery. No more central pulmonary emboli are identified. No evidence of right heart strain. There is no thoracic aortic aneurysm or dissection. The visualized great vessels appear normal. There is a small area of patchy infiltrate in the anterior segment of the left upper lobe, stable. There is patchy atelectatic change in both bases, stable. No new opacity is appreciable. Cardiomegaly is stable.  The pericardium is not thickened. Visualized thyroid appears normal. Anterior mediastinal adenopathy remains, stable. The largest individual lymph node in the anterior mediastinum measures 2.3 x 1.5 cm, unchanged. There is a single prominent lymph node in the right hilum measuring 1.6 x 1.2 cm. There is no new adenopathy. Central catheter tip is near the cavoatrial junction. In the visualized upper abdomen, there is diffuse calcification of the spleen consistent with a degree of autoinfarction, stable. 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 Bones overall are sclerotic consistent with the chronic anemia. There is a probable sebaceous cyst measuring 2.1 x 1.1 cm in the right anterior chest wall, stable. Review of the MIP images confirms the above findings. IMPRESSION: Small pulmonary embolus in a posterior segment right lower lobe pulmonary artery branch, not present recently and presumed new. No other pulmonary emboli identified. Stable cardiomegaly and anterior mediastinal adenopathy. Single prominent lymph node in the right hilum. Stable areas of patchy lower lobe atelectasis and mild patchy infiltrate in the left upper lobe. No new parenchymal lung opacity. 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 prior splenic autoinfarction. Critical Value/emergent results were called by telephone at the time of interpretation on 11/13/2014 at 12:32 pm to Dr. Joseph Berkshire , who verbally acknowledged these results. Electronically Signed    By: Lowella Grip III M.D.   On: 11/13/2014 12:34   Dg Chest Port 1 View  11/25/2014  CLINICAL DATA:  Current history of sickle cell disease, presenting with hypoxia. Recent diagnosis of pulmonary embolus. EXAM: PORTABLE CHEST 1 VIEW COMPARISON:  Chest x-ray 11/15/2014 and earlier. CTA chest 11/13/2014. CT chest 10/26/2014 and earlier. FINDINGS: Cardiac silhouette massively enlarged, unchanged. Widening of the superior mediastinum, unchanged, shown on prior CT to be due to anterior mediastinal lymphadenopathy. Stable scarring in the lung bases. Lungs otherwise clear. No localized airspace consolidation.  No pleural effusions. No pneumothorax. Normal pulmonary vascularity. Left subclavian Port-A-Cath tip projects at the cavoatrial junction, unchanged. IMPRESSION: Stable massive cardiomegaly.  No acute cardiopulmonary disease. Electronically Signed   By: Evangeline Dakin M.D.   On: 11/25/2014 10:01   Other results: EKG: Sinus rhythm, LAE, consider biatrial enlargement and incomplete right bundle branch block. No significant change since last tracing .   Assessment and Plan: 1. Syncope: I suspect that the syncope was related to his hypoxic state and will occur again unless patient becomes compliant with use of Oxygen. However will observe to make sure that there is no cardiac component and that he has no arrhythmias that would contribute to the syncope.  2. Pulmonary Embolus: Imaging shoes a small PE and I am less concerned about it's effect than what may be preventable here. He is already on his 2nd injectable anticoagulant (previously was on Xarelto) and he is hemodynamically stable. He does not want and quite frankly would be a poor candidate for filter as he had not demonstrated DVT's as a source of the PE's. Will order doppler U/S studies of his legs and thigh and if [positve for DVT will pursue discussion on filter. Will also ak=sk Pulmonologist to give opinion and recommendations. 3. Elevated  Troponin: Suspect this is from heart strain. He also has a right incomplete bundle branch block likely relate to right heart enlargement. Will cycle Troponins and place on Telemetry monitoring. 4. Chronic Hypoxic Respiratory Failure: Pt using 4 l/min of Oxygen, He has been non-compliant for several reasons. Will consult Case Management to assist with ensuring that he has adequate Oxygen at home 5. Hb SS with crisis: Pt likely went back into crisis due to hypoxic state from non-compliance with Oxygen. I will treat with IV Toradol, Dilaudid PCa and IVF. 6. Chronic Hypoxic Respiratory Failure 7. Emesis: Unsure of cause of emesis as he has no GI findings. However patient tolerating diet at present.  Total time spent 1 hour  Allsion Nogales A. 11/30/2014, 11:51 AM

## 2014-11-30 NOTE — ED Provider Notes (Signed)
CSN: [id number]     Arrival date & time 11/30/14  8546 History   First MD Initiated Contact with Patient 11/30/14 [phone]     No chief complaint on file.    (Consider location/radiation/quality/duration/timing/severity/associated sxs/prior Treatment) Patient is a 34 y.o. male presenting with shortness of breath.  Shortness of Breath Severity:  Severe Onset quality:  Gradual Duration:  2 days Timing:  Constant Progression:  Worsening Chronicity:  Recurrent Relieved by:  Nothing Worsened by:  Nothing tried Ineffective treatments:  None tried Associated symptoms: chest pain   Associated symptoms: no abdominal pain, no cough, no fever, no headaches, no rash, no sore throat, no syncope and no vomiting   Risk factors: hx of PE/DVT     Past Medical History  Diagnosis Date  . Sickle cell anemia (HCC)   . Blood transfusion   . Acute embolism and thrombosis of right internal jugular vein ([place])   . Hypokalemia   . Mood disorder ([place])   . History of pulmonary embolus (PE)   . Avascular necrosis ([place])   . Leukocytosis     Chronic  . Thrombocytosis (HCC)     Chronic  . Hypertension   . History of Clostridium difficile infection   . Uses marijuana   . Chronic anticoagulation   . Functional asplenia   . Former smoker   . Second hand tobacco smoke exposure   . Alcohol consumption of one to four drinks per day   . Noncompliance with medication regimen   . Sickle-cell crisis with associated acute chest syndrome ([place]) 05/13/2013  . Acute chest syndrome ([place]) 06/18/2013  . Demand ischemia ([place]) 01/02/2014   Past Surgical History  Procedure Laterality Date  . Right hip replacement      08/2006  . Cholecystectomy      01/2008  . Porta cath placement    . Porta cath removal    . Umbilical hernia repair      01/2008  . Excision of left periauricular cyst      10/2009  . Excision of right ear lobe cyst with primary closur      11/2007  . Portacath placement  01/05/2012    Procedure:  INSERTION PORT-A-CATH;  Surgeon: Odis Hollingshead, MD;  Location: [facility];  Service: General;  Laterality: N/A;  ultrasound guiced port a cath insertion with fluoroscopy   Family History  Problem Relation Age of Onset  . Sickle cell trait Mother   . Depression Mother   . Diabetes Mother   . Sickle cell trait Father   . Sickle cell trait Brother    Social History  Substance Use Topics  . Smoking status: Former Smoker -- 13 years  . Smokeless tobacco: Never Used  . Alcohol Use: No    Review of Systems  Constitutional: Negative for fever.  HENT: Negative for sore throat.   Eyes: Negative for visual disturbance.  Respiratory: Positive for shortness of breath. Negative for cough.   Cardiovascular: Positive for chest pain. Negative for syncope.  Gastrointestinal: Negative for vomiting and abdominal pain.  Genitourinary: Negative for difficulty urinating.  Musculoskeletal: Negative for back pain and neck stiffness.  Skin: Negative for rash.  Neurological: Negative for syncope and headaches.      Allergies  Review of patient's allergies indicates no known allergies.  Home Medications   Prior to Admission medications   Medication Sig Start Date End Date Taking? Authorizing Provider  aspirin 81 MG chewable tablet Chew 1 tablet (81 mg total) by  mouth daily. 07/23/14  Yes [name], MD  Cholecalciferol (VITAMIN D) 2000 UNITS tablet Take 1 tablet (2,000 Units total) by mouth daily. 02/13/14  Yes [name], MD  enoxaparin (LOVENOX) 150 MG/ML injection Inject 0.73 mLs (110 mg total) into the skin daily. 09/26/14  Yes Micheline Chapman, NP  folic acid (FOLVITE) 1 MG tablet Take 1 tablet (1 mg total) by mouth every morning. 10/08/13  Yes [name], MD  HYDROmorphone (DILAUDID) 4 MG tablet Take 1 tablet (4 mg total) by mouth every 4 (four) hours as needed for severe pain. 11/06/14  Yes Dorena Dew, FNP  hydroxyurea (HYDREA) 500 MG capsule Take 1 capsule (500 mg  total) by mouth daily. May take with food to minimize GI side effects. 11/06/14  Yes Dorena Dew, FNP  lisinopril (PRINIVIL,ZESTRIL) 10 MG tablet Take 1 tablet (10 mg total) by mouth daily. 01/22/14  Yes [name], MD  metoprolol succinate (TOPROL-XL) 50 MG 24 hr tablet Take 1 tablet (50 mg total) by mouth daily. 11/28/14  Yes [name], MD  morphine (MS CONTIN) 30 MG 12 hr tablet Take 1 tablet (30 mg total) by mouth every 12 (twelve) hours. 11/18/14  Yes Tresa Garter, MD  potassium chloride SA (K-DUR,KLOR-CON) 20 MEQ tablet Take 1 tablet (20 mEq total) by mouth every morning. 06/09/14  Yes [name], MD  zolpidem (AMBIEN) 10 MG tablet Take 1 tablet (10 mg total) by mouth at bedtime as needed for sleep. 10/23/14  Yes Olugbemiga E Jegede, MD   BP 125/79 mmHg  Pulse 97  Temp(Src) 99 F (37.2 C) (Oral)  Resp 15  Ht 6' (1.829 m)  Wt 165 lb (74.844 kg)  BMI 22.37 kg/m2  SpO2 98% Physical Exam  Constitutional: He is oriented to person, place, and time. He appears well-developed and well-nourished. No distress.  HENT:  Head: Normocephalic and atraumatic.  Eyes: Conjunctivae and EOM are normal.  Neck: Normal range of motion.  Cardiovascular: Normal rate, regular rhythm, normal heart sounds and intact distal pulses.  Exam reveals no gallop and no friction rub.   No murmur heard. Pulmonary/Chest: Effort normal and breath sounds normal. Tachypnea noted. No respiratory distress. He has no wheezes. He has no rales. He exhibits tenderness.  Abdominal: Soft. He exhibits no distension. There is no tenderness. There is no guarding.  Musculoskeletal: He exhibits no edema.  Neurological: He is alert and oriented to person, place, and time.  Skin: Skin is warm and dry. He is not diaphoretic.  Nursing note and vitals reviewed.   ED Course  Procedures (including critical care time) Labs Review Labs Reviewed  COMPREHENSIVE METABOLIC PANEL - Abnormal; Notable for the  following:    Glucose, Bld 117 (*)    Calcium 8.8 (*)    AST 45 (*)    Total Bilirubin 7.2 (*)    All other components within normal limits  CBC WITH DIFFERENTIAL/PLATELET - Abnormal; Notable for the following:    WBC 15.5 (*)    RBC 2.64 (*)    Hemoglobin 8.2 (*)    HCT 23.1 (*)    RDW 19.5 (*)    Platelets 427 (*)    Neutro Abs 11.2 (*)    Monocytes Absolute 1.9 (*)    All other components within normal limits  RETICULOCYTES - Abnormal; Notable for the following:    Retic Ct Pct 21.8 (*)    RBC. 2.64 (*)    Retic Count, Manual  575.5 (*)    All other components within normal limits  BRAIN NATRIURETIC PEPTIDE - Abnormal; Notable for the following:    B Natriuretic Peptide 639.7 (*)    All other components within normal limits  I-STAT CHEM 8, ED - Abnormal; Notable for the following:    Glucose, Bld 114 (*)    Hemoglobin 8.5 (*)    HCT 25.0 (*)    All other components within normal limits  I-STAT TROPOININ, ED - Abnormal; Notable for the following:    Troponin i, poc 0.30 (*)    All other components within normal limits    Imaging Review Ct Angio Chest W/cm &/or Wo Cm  11/30/2014  CLINICAL DATA:  Shortness of breath. EXAM: CT ANGIOGRAPHY CHEST WITH CONTRAST TECHNIQUE: Multidetector CT imaging of the chest was performed using the standard protocol during bolus administration of intravenous contrast. Multiplanar CT image reconstructions and MIPs were obtained to evaluate the vascular anatomy. CONTRAST:  176mL OMNIPAQUE IOHEXOL 350 MG/ML SOLN COMPARISON:  10/26/2014 FINDINGS: Mediastinum: Overall examination is suboptimal. Poor pulmonary arterial opacification and respiratory motion artifact diminishes exam detail. The heart size is moderately enlarged. No pericardial effusion. Prominent anterior mediastinal lymph nodes are again noted, unchanged from previous exam compatible with adenopathy. The trachea appears patent and is midline. Unremarkable appearance of the esophagus. Left  upper lobe segmental pulmonary artery filling defect is identified, image 88 of series 14. Lungs/Pleura: No pleural effusion identified. There is atelectasis noted in both lung bases. No airspace consolidation. Upper Abdomen: The visualized portions of the liver are diffusely increased and attenuation. Calcified spleen noted. Musculoskeletal: Bony stigmata of sickle cell disease identified. Review of the MIP images confirms the above findings. IMPRESSION: 1. Exam detail diminished secondary to respiratory motion artifact and poor pulmonary arterial opacification. 2. Examination is positive for at least 1 segmental branch pulmonary artery filling defect compatible with acute pulmonary embolus. 3. Chronic changes of sickle cell disease. Electronically Signed   By: Kerby Moors M.D.   On: 11/30/2014 10:28   I have personally reviewed and evaluated these images and lab results as part of my medical decision-making.   EKG Interpretation   Date/Time:  Sunday November 30 2014 08:36:26 EDT Ventricular Rate:  96 PR Interval:  190 QRS Duration: 115 QT Interval:  371 QTC Calculation: 469 R Axis:   -105 Text Interpretation:  Sinus rhythm LAE, consider biatrial enlargement  Incomplete right bundle branch block Inferior infarct, old Abnormal  lateral Q waves Abnrm T, consider ischemia, anterolateral lds Baseline  wander in lead(s) I III aVL V2 V6 No significant change since last tracing  Confirmed by [facility] MD, Savanna Dooley ([id number]) on 11/30/2014 8:58:26 AM      MDM   Final diagnoses:  Acute respiratory failure with hypoxia (HCC)  Other acute pulmonary embolism without acute cor pulmonale ([place])   34 year old male with a history of sickle cell disease, pulmonary embolus on Lovenox, pulmonary hypertension, hypertension, chronic pain presents with concern for chest pain and shortness of breath. He is seen in the emergency department for the same was evaluated and discharged with likely sickle cell pain  crisis. His symptoms are worsening, and arrival to the emergency department patient was 79% on room air. He is to be taking 4L of oxygen at home (since recent admission), however when placed on his home oxygen level patient had continuing hypoxia in the low 80s.  Nasal cannula was increased to 5-6 L with patient maintaining his saturations in the 90s.  CT  PE study was completed prior to patient receiving x-ray, and while it is limited secondary to respiratory motion, shows at least one new segmental branch pulmonary artery defect.  There are no other airspace findings, and have low suspicion for acute chest syndrome at this time. EKG shows signs of pulmonary hypertension similar to prior.  Troponin and BNP elevated likely secondary to strain, and are noted to be elevated chronically.  Patient was given 2mg  Dilaudid for pain, benadryl and zofran.  Sickle Cell team was consulted for admission.  Dr. Rodena Piety reports there are not other options for patient with history of PE while on variety of anticoagulation with pulmonary hypertension and noncompliance with wearing O2 when he leaves his home, however will admit patient for observation.    Gareth Morgan, MD 12/01/14 [phone]

## 2014-12-01 ENCOUNTER — Telehealth: Payer: Self-pay | Admitting: Internal Medicine

## 2014-12-01 ENCOUNTER — Encounter: Payer: Self-pay | Admitting: *Deleted

## 2014-12-01 ENCOUNTER — Observation Stay (HOSPITAL_BASED_OUTPATIENT_CLINIC_OR_DEPARTMENT_OTHER): Payer: Medicare Other

## 2014-12-01 ENCOUNTER — Observation Stay (HOSPITAL_COMMUNITY): Payer: Medicare Other

## 2014-12-01 DIAGNOSIS — R55 Syncope and collapse: Secondary | ICD-10-CM | POA: Diagnosis not present

## 2014-12-01 DIAGNOSIS — I2699 Other pulmonary embolism without acute cor pulmonale: Secondary | ICD-10-CM | POA: Diagnosis not present

## 2014-12-01 LAB — CBC WITH DIFFERENTIAL/PLATELET
BASOS PCT: 0 %
Basophils Absolute: 0 10*3/uL (ref 0.0–0.1)
EOS ABS: 0.8 10*3/uL — AB (ref 0.0–0.7)
Eosinophils Relative: 5 %
HEMATOCRIT: 22.8 % — AB (ref 39.0–52.0)
HEMOGLOBIN: 7.9 g/dL — AB (ref 13.0–17.0)
LYMPHS PCT: 23 %
Lymphs Abs: 3.8 10*3/uL (ref 0.7–4.0)
MCH: 31.3 pg (ref 26.0–34.0)
MCHC: 34.6 g/dL (ref 30.0–36.0)
MCV: 90.5 fL (ref 78.0–100.0)
MONOS PCT: 13 %
Monocytes Absolute: 2.1 10*3/uL — ABNORMAL HIGH (ref 0.1–1.0)
NEUTROS ABS: 9.8 10*3/uL — AB (ref 1.7–7.7)
NRBC: 3 /100{WBCs} — AB
Neutrophils Relative %: 59 %
Platelets: 413 10*3/uL — ABNORMAL HIGH (ref 150–400)
RBC: 2.52 MIL/uL — ABNORMAL LOW (ref 4.22–5.81)
RDW: 19.4 % — ABNORMAL HIGH (ref 11.5–15.5)
WBC: 16.5 10*3/uL — ABNORMAL HIGH (ref 4.0–10.5)

## 2014-12-01 LAB — TROPONIN I
TROPONIN I: 0.52 ng/mL — AB (ref <0.031)
Troponin I: 0.51 ng/mL (ref <0.031)
Troponin I: 0.49 ng/mL — ABNORMAL HIGH (ref <0.031)

## 2014-12-01 LAB — HEPARIN ANTI-XA: HEPARIN LMW: 0.42 [IU]/mL

## 2014-12-01 NOTE — Progress Notes (Signed)
SICKLE CELL SERVICE PROGRESS NOTE  JONY LADNIER PIR:[id number] DOB: 1980/02/17 DOA: 11/30/2014 PCP: [name], MD  Assessment/Plan: Active Problems:   Hb-SS disease with crisis ([place])   Sickle cell pain crisis ([place])   Pulmonary embolus ([place])   Syncope  1. Syncope: Pt has had no further episodes of syncope. I feel that this is likely secondary to hypoxia associated with chronic hypoxic respiratory failure and non-compliance with Oxygen use.  2. Hb SS with crisis: Pt presented in crisis again likely precipitated by hypoxemia. He has used 27.6 mg of Dilaudid and 38/46: demands/deliveries. I will continue Toradol and PCA at current dose. Will also add clinician assisted doses every 3 hours as needed and hopefully will be able to discharge home tomorrow. 3. Acute on Chronic Respiratory Failure with Hypoxia: Pt ahs not been compliant with Oxygen use. Currently he is requiring 5 liters to maintain Oxygen at 92%. This is above his baseline and is likely caused by the new PE found on CT or a worsening of pulmonary HTN due to non-compliance with Oxygen. Pt not a candidate for arterial vasodilators. 4. Recurrent Pulmonary Embolus: Appreciate input from pulmonary. Preliminary findings on duplex ultrasound is negative for peripheral venous thrombosis.  5. Anemia of chronic disease: Pt has is at baseline Hb.  6. Chronic pain: Continue MS Contin. 7. Medication non-compliance: See discussion above regarding oxygen. I have spoken with Dr. Doreene Burke about this patient's condition and the prognosis associated with his pulmonary HTN. I believe that his life expectancy is quite limited (<6 month) if he continues to be non-compliant with the Oxygen use. Also he keeps having recurrent PE's which will likely only be prevented by serial exchange transfusions to decrease the percentage of Hb S- which in itself predisposes to hypercoagulability due to cell surface properties. I have discussed the above with Dr.  Doreene Burke and have recommended that a conversation about advance directives and palliative care be considered in the out patient setting.  Code Status: Full Code Family Communication: N/A Disposition Plan: Not yet ready for discharge  [place].  Pager [phone]. If 7PM-7AM, please contact night-coverage.  12/01/2014, 12:35 PM     Consultants:  Pulmonary  Procedures:  None  Antibiotics:  None  HPI/Subjective: Pt reports pain at 7/10 and localized to ribs bilaterally. He denies dizziness or lightheadedness.  Objective: Filed Vitals:   12/01/14 0800 12/01/14 0809 12/01/14 1019 12/01/14 1154  BP:      Pulse:      Temp:      TempSrc:      Resp: 20 18 16 23   Height:      Weight:      SpO2: 99% 100% 93% 90%   Weight change:   Intake/Output Summary (Last 24 hours) at 12/01/14 1235 Last data filed at 12/01/14 1100  Gross per 24 hour  Intake 1591.25 ml  Output    725 ml  Net 866.25 ml    General: Alert, awake, oriented x3, in no acute distress.  HEENT: [place]/AT PEERL, EOMI, mild icterus OROPHARYNX:  Moist, No exudate/ erythema/lesions.  Heart: Regular rate and rhythm, without murmurs, rubs, gallops, hyperdynamic precordium. Lungs: Clear to auscultation, no wheezing or rhonchi noted. No increased vocal fremitus resonant to percussion  Abdomen: Soft, nontender, nondistended, positive bowel sounds, no masses no hepatosplenomegaly noted.  Neuro: No focal neurological deficits noted cranial nerves II through XII grossly intact. . Strength at baseline in bilateral upper and lower extremities. Musculoskeletal: No warm swelling or erythema around joints, no  spinal tenderness noted. Psychiatric: Patient alert and oriented x3, good insight and cognition, good recent to remote recall.    Data Reviewed: Basic Metabolic Panel:  Recent Labs Lab 11/25/14 1030 11/29/14 1740 11/30/14 0910 11/30/14 0918 11/30/14 1356  NA 140 136 137 141  --   K 3.7 3.6 3.5 3.5  --    CL 115* 108 110 106  --   CO2 19* 22 22  --   --   GLUCOSE 115* 102* 117* 114*  --   BUN 11 7 11 11   --   CREATININE 0.67 0.51* 0.69 0.80  --   CALCIUM 8.6* 9.0 8.8*  --   --   MG  --   --   --   --  1.5*   Liver Function Tests:  Recent Labs Lab 11/29/14 1740 11/30/14 0910  AST 43* 45*  ALT 23 23  ALKPHOS 82 82  BILITOT 7.9* 7.2*  PROT 7.0 7.2  ALBUMIN 3.9 3.8   No results for input(s): LIPASE, AMYLASE in the last 168 hours. No results for input(s): AMMONIA in the last 168 hours. CBC:  Recent Labs Lab 11/26/14 1100 11/28/14 1020 11/29/14 1740 11/30/14 0910 11/30/14 0918 12/01/14 0333  WBC 17.2* 15.4* 14.3* 15.5*  --  16.5*  NEUTROABS 10.6* 10.6* 10.0* 11.2*  --  9.8*  HGB 7.2* 7.4* 7.9* 8.2* 8.5* 7.9*  HCT 20.3* 20.5* 22.2* 23.1* 25.0* 22.8*  MCV 91.0 89.9 87.7 87.5  --  90.5  PLT 404* 356 413* 427*  --  413*   Cardiac Enzymes:  Recent Labs Lab 12/01/14 1035  TROPONINI 0.51*   BNP (last 3 results)  Recent Labs  08/29/14 0810 09/10/14 0612 11/30/14 0910  BNP 1117.0* 549.5* 639.7*    ProBNP (last 3 results)  Recent Labs  01/01/14 0909  PROBNP 5790.0*    CBG: No results for input(s): GLUCAP in the last 168 hours.  Recent Results (from the past 240 hour(s))  MRSA PCR Screening     Status: None   Collection Time: 11/24/14  4:39 PM  Result Value Ref Range Status   MRSA by PCR NEGATIVE NEGATIVE Final    Comment:        The GeneXpert MRSA Assay (FDA approved for NASAL specimens only), is one component of a comprehensive MRSA colonization surveillance program. It is not intended to diagnose MRSA infection nor to guide or monitor treatment for MRSA infections.      Studies: Dg Chest 2 View  11/26/2014  CLINICAL DATA:  Sickle cell crisis, shortness of breath. EXAM: CHEST  2 VIEW COMPARISON:  Chest x-ray of November 25, 2014 FINDINGS: The cardiopericardial silhouette remains enlarged. The pulmonary vascularity is less engorged today. The  lungs are well-expanded. There is no focal infiltrate or pleural effusion. The power port appliance tip is stable projecting over the distal third of the SVC just proximal to the cavoatrial junction. There are stable coarse retrocardiac lung markings. There is multilevel degenerative disc disease of the thoracic spine. IMPRESSION: Improved appearance of the pulmonary vascularity and pulmonary interstitium consistent with resolution of pulmonary edema. Stable cardiomegaly. There is no evidence of pneumonia. Electronically Signed   By: David  Martinique M.D.   On: 11/26/2014 15:30   Dg Chest 2 View  11/15/2014  CLINICAL DATA:  Back pain.  Sickle cell anemia. EXAM: CHEST  2 VIEW COMPARISON:  11/13/2014 FINDINGS: Cardiomegaly. Left subclavian vein Port-A-Cath is stable. No pneumothorax. Chronic changes at the lung bases. IMPRESSION: No active cardiopulmonary  disease. Electronically Signed   By: Marybelle Killings M.D.   On: 11/15/2014 08:52   Dg Chest 2 View  11/13/2014  CLINICAL DATA:  Sickle cell pain crisis with left lateral chest wall pain EXAM: CHEST  2 VIEW COMPARISON:  10/26/2014 FINDINGS: Chronic cardiomegaly. Negative aortic and hilar contours. Stable left subclavian porta catheter placement, tip at the upper right atrium. Chronic interstitial coarsening consistent with scar. There is no edema, consolidation, effusion, or pneumothorax. No osseous findings to explain acute pain. Chronic humeral head osteonecrosis. Cholecystectomy. IMPRESSION: Stable lung scarring and cardiomegaly. No evidence of acute cardiopulmonary disease. Electronically Signed   By: Monte Fantasia M.D.   On: 11/13/2014 08:15   Dg Lumbar Spine Complete  11/15/2014  CLINICAL DATA:  Sickle cell pain crisis EXAM: LUMBAR SPINE - COMPLETE 4+ VIEW COMPARISON:  None. FINDINGS: Five lumbar type vertebral bodies. Straightening of the lumbar spine. No evidence of fracture or dislocation. Vertebral body heights are maintained. Mild superior/ inferior  endplate changes at L5 characteristic of sickle cell. Visualized bony pelvis appears intact. IMPRESSION: No fracture or dislocation is seen. Electronically Signed   By: Julian Hy M.D.   On: 11/15/2014 11:22   Dg Pelvis 1-2 Views  11/15/2014  CLINICAL DATA:  Sickle cell pain crisis EXAM: PELVIS - 1-2 VIEW COMPARISON:  CT right hip dated 10/27/2013 FINDINGS: Right hip arthroplasty in satisfactory position. No fracture or dislocation is seen. Left hip joint space is preserved. Visualized bony pelvis appears intact. IMPRESSION: Right hip arthroplasty satisfactory position. No fracture or dislocation is seen. Electronically Signed   By: Julian Hy M.D.   On: 11/15/2014 11:19   Ct Angio Chest W/cm &/or Wo Cm  11/30/2014  CLINICAL DATA:  Shortness of breath. EXAM: CT ANGIOGRAPHY CHEST WITH CONTRAST TECHNIQUE: Multidetector CT imaging of the chest was performed using the standard protocol during bolus administration of intravenous contrast. Multiplanar CT image reconstructions and MIPs were obtained to evaluate the vascular anatomy. CONTRAST:  169mL OMNIPAQUE IOHEXOL 350 MG/ML SOLN COMPARISON:  10/26/2014 FINDINGS: Mediastinum: Overall examination is suboptimal. Poor pulmonary arterial opacification and respiratory motion artifact diminishes exam detail. The heart size is moderately enlarged. No pericardial effusion. Prominent anterior mediastinal lymph nodes are again noted, unchanged from previous exam compatible with adenopathy. The trachea appears patent and is midline. Unremarkable appearance of the esophagus. Left upper lobe segmental pulmonary artery filling defect is identified, image 88 of series 14. Lungs/Pleura: No pleural effusion identified. There is atelectasis noted in both lung bases. No airspace consolidation. Upper Abdomen: The visualized portions of the liver are diffusely increased and attenuation. Calcified spleen noted. Musculoskeletal: Bony stigmata of sickle cell disease  identified. Review of the MIP images confirms the above findings. IMPRESSION: 1. Exam detail diminished secondary to respiratory motion artifact and poor pulmonary arterial opacification. 2. Examination is positive for at least 1 segmental branch pulmonary artery filling defect compatible with acute pulmonary embolus. 3. Chronic changes of sickle cell disease. Electronically Signed   By: Kerby Moors M.D.   On: 11/30/2014 10:28   Ct Angio Chest Pe W/cm &/or Wo Cm  11/13/2014  CLINICAL DATA:  Chest pain.  Sickle cell disease EXAM: CT ANGIOGRAPHY CHEST WITH CONTRAST TECHNIQUE: Multidetector CT imaging of the chest was performed using the standard protocol during bolus administration of intravenous contrast. Multiplanar CT image reconstructions and MIPs were obtained to evaluate the vascular anatomy. CONTRAST:  155mL OMNIPAQUE IOHEXOL 350 MG/ML SOLN COMPARISON:  Chest CT October 26, 2014 and November 13, 2014  chest radiograph FINDINGS: There is a small pulmonary embolus in the posterior segment right lower lobe pulmonary artery. No more central pulmonary emboli are identified. No evidence of right heart strain. There is no thoracic aortic aneurysm or dissection. The visualized great vessels appear normal. There is a small area of patchy infiltrate in the anterior segment of the left upper lobe, stable. There is patchy atelectatic change in both bases, stable. No new opacity is appreciable. Cardiomegaly is stable.  The pericardium is not thickened. Visualized thyroid appears normal. Anterior mediastinal adenopathy remains, stable. The largest individual lymph node in the anterior mediastinum measures 2.3 x 1.5 cm, unchanged. There is a single prominent lymph node in the right hilum measuring 1.6 x 1.2 cm. There is no new adenopathy. Central catheter tip is near the cavoatrial junction. In the visualized upper abdomen, there is diffuse calcification of the spleen consistent with a degree of autoinfarction, stable.  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 Bones overall are sclerotic consistent with the chronic anemia. There is a probable sebaceous cyst measuring 2.1 x 1.1 cm in the right anterior chest wall, stable. Review of the MIP images confirms the above findings. IMPRESSION: Small pulmonary embolus in a posterior segment right lower lobe pulmonary artery branch, not present recently and presumed new. No other pulmonary emboli identified. Stable cardiomegaly and anterior mediastinal adenopathy. Single prominent lymph node in the right hilum. Stable areas of patchy lower lobe atelectasis and mild patchy infiltrate in the left upper lobe. No new parenchymal lung opacity. 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 prior splenic autoinfarction. Critical Value/emergent results were called by telephone at the time of interpretation on 11/13/2014 at 12:32 pm to Dr. Joseph Berkshire , who verbally acknowledged these results. Electronically Signed   By: Lowella Grip III M.D.   On: 11/13/2014 12:34   Dg Chest Port 1 View  11/25/2014  CLINICAL DATA:  Current history of sickle cell disease, presenting with hypoxia. Recent diagnosis of pulmonary embolus. EXAM: PORTABLE CHEST 1 VIEW COMPARISON:  Chest x-ray 11/15/2014 and earlier. CTA chest 11/13/2014. CT chest 10/26/2014 and earlier. FINDINGS: Cardiac silhouette massively enlarged, unchanged. Widening of the superior mediastinum, unchanged, shown on prior CT to be due to anterior mediastinal lymphadenopathy. Stable scarring in the lung bases. Lungs otherwise clear. No localized airspace consolidation. No pleural effusions. No pneumothorax. Normal pulmonary vascularity. Left subclavian Port-A-Cath tip projects at the cavoatrial junction, unchanged. IMPRESSION: Stable massive cardiomegaly.  No acute cardiopulmonary disease. Electronically Signed   By: Evangeline Dakin M.D.   On: 11/25/2014 10:01     Scheduled Meds: . aspirin  81 mg Oral Daily  . cholecalciferol  2,000 Units Oral Daily  . enoxaparin  110 mg Subcutaneous Q24H  . folic acid  1 mg Oral q morning - 10a  . HYDROmorphone   Intravenous 6 times per day  . hydroxyurea  500 mg Oral Daily  . ketorolac  30 mg Intravenous 4 times per day  . lisinopril  10 mg Oral Daily  . metoprolol succinate  50 mg Oral Daily  . morphine  30 mg Oral Q12H  . potassium chloride SA  20 mEq Oral q morning - 10a  . senna-docusate  1 tablet Oral BID  . sodium chloride  3 mL Intravenous Q12H   Continuous Infusions: . dextrose 5 % and 0.45% NaCl 75 mL/hr at 12/01/14 1019    Time spent 45 minutes

## 2014-12-01 NOTE — Progress Notes (Signed)
CRITICAL VALUE ALERT  Critical value received:  Trop 0.52  Date of notification: 12/01/2014  Time of notification:  2144  Critical value read back:Yes.     Nurse who received alert: Linden Dolin   MD notified (1st page):  2145  Time of first page:  2145  MD notified (2nd page):  Time of second page:  Responding MD:  Lionel December, NP  Time MD responded:  2207

## 2014-12-01 NOTE — Progress Notes (Signed)
Ambulated patient in hallway on 4L O2 (baseline). O2 stat ranged from 85% to 93% while ambulating.  Pt denies SOB, CP with ambulation. At rest pt o2 stat 95% on 4 L. Long, Marissa A

## 2014-12-01 NOTE — Care Management Note (Signed)
Case Management Note  Patient Details  Name: JAQUEZ FARRINGTON MRN: [id number] Date of Birth: [birth year]  Subjective/Objective:  Noted home 02 eval. Patient was already on home 02-new orders for home 02 4lnc continuous. [facility] dme rep Pura Spice aware of order, & following for processing,  D/c(working w/patient to re-set up appt for teaching of travel tanks, & home concentrators. Patient states he will  have someone from home to bring home 02 travel tank to hospital @ d/c. [facility] rep Cyril Mourning for [facility] aware of orders, await d/c order.                Action/Plan:d/c plan home w/HHC/increased home 02.   Expected Discharge Date:                  Expected Discharge Plan:  [place]  In-House Referral:     Discharge planning Services  CM Consult  Post Acute Care Choice:  Durable Medical Equipment [facility]) Choice offered to:  Patient  DME Arranged:  Oxygen DME Agency:  [place]:  RN, Respirator Therapy [place] Agency:  [place]  Status of Service:  In process, will continue to follow  Medicare Important Message Given:    Date Medicare IM Given:    Medicare IM give by:    Date Additional Medicare IM Given:    Additional Medicare Important Message give by:     If discussed at [place] of Stay Meetings, dates discussed:    Additional Comments:  Dessa Phi, RN 12/01/2014, 1:57 PM

## 2014-12-01 NOTE — Progress Notes (Signed)
VASCULAR LAB PRELIMINARY  PRELIMINARY  PRELIMINARY  PRELIMINARY  Bilateral lower extremity venous duplex and right upper extremity duplex completed.    Preliminary report:   1.  Lower extremity:  Bilateral:  No evidence of DVT, superficial thrombosis, or Baker's Cyst. 2.  Upper extremity:  Left:  No evidence of DVT or superficial thrombosis.    Keri Tavella, RVT 12/01/2014, 9:40 AM

## 2014-12-01 NOTE — Progress Notes (Signed)
[facility]   [facility] is providing the following services: Received order for oxygen at 4 LPM cont.  He is a current patient of [facility] with oxygen at 3 lpm cont.  We have processed the order to increase oxygen flow to 4 liters.  Patient already has travel tanks and a WORKING concentrator at home.  It is documented that we have been working with him for a more portable systems and he as broken his appointments.    If patient discharges after hours, please call [phone].   Linward Headland 12/01/2014, 1:37 PM

## 2014-12-01 NOTE — Telephone Encounter (Signed)
Refill request for hydromorphone 4mg . LOV 11/18/2014. Please advise. Thanks!

## 2014-12-01 NOTE — Progress Notes (Signed)
CRITICAL VALUE ALERT  Critical value received:  Trop 0.51  Date of notification:  12/01/14  Time of notification:  1112  Critical value read back:Yes.    Nurse who received alert:  Tye Savoy RN  MD notified (1st page):  1112  Time of first page:  1112  MD notified (2nd page):  Time of second page:  Responding MD:  Zigmund Daniel  Time MD responded:  1120

## 2014-12-01 NOTE — Progress Notes (Signed)
SATURATION QUALIFICATIONS: (This note is used to comply with regulatory documentation for home oxygen)  Patient Saturations on Room Air at Rest = 77%  Patient Saturations on Room Air while Ambulating = 76%  Patient Saturations on 4 Liters of oxygen while Ambulating = 86-95%  Please briefly explain why patient needs home oxygen: O2 stats are not above 90% while at rest or while ambulating while on o2.

## 2014-12-01 NOTE — Care Management Note (Signed)
Case Management Note  Patient Details  Name: [name] MRN: [id number] Date of Birth: 05-01-1980  Subjective/Objective: Spoke to patient about home 02. Patient states he has home 02 but not sure how to use the tanks & concentrator @ home.  Spoke to [facility] dme rep Pura Spice who reviewed their notes-Per [facility]-patient had an appt set for 10/19-No show.  Per [facility] dme-patient has 2 concentrators, & travel tanks @ home, but wanted smaller tanks, they were setting up appt on 10/19 to accomodate patient request, but per [facility] 10/19 appt No show.  [facility] dme rep will come to patient's rm to discuss going forward arrangements for the appt w/the type, & education of concentrators, & travel tanks.  Awaiting documentation of 02 sats if higher level of home 02 needed, & home 02 orders.[facility] chosen for [facility], Home respiratory care.  [facility] rep Kristen aware.Await [place] orders.                   Action/Plan:d/c home w/HHC/DME   Expected Discharge Date:                  Expected Discharge Plan:  [place]  In-House Referral:     Discharge planning Services  CM Consult  Post Acute Care Choice:  Durable Medical Equipment [facility]) Choice offered to:  Patient  DME Arranged:    DME Agency:     HH Arranged:    [place] Agency:     Status of Service:  In process, will continue to follow  Medicare Important Message Given:    Date Medicare IM Given:    Medicare IM give by:    Date Additional Medicare IM Given:    Additional Medicare Important Message give by:     If discussed at [place] of Stay Meetings, dates discussed:    Additional Comments:  Dessa Phi, RN 12/01/2014, 11:22 AM

## 2014-12-02 ENCOUNTER — Other Ambulatory Visit: Payer: Self-pay | Admitting: Internal Medicine

## 2014-12-02 ENCOUNTER — Telehealth: Payer: Self-pay | Admitting: Clinical

## 2014-12-02 DIAGNOSIS — D571 Sickle-cell disease without crisis: Secondary | ICD-10-CM

## 2014-12-02 LAB — BASIC METABOLIC PANEL
ANION GAP: 4 — AB (ref 5–15)
BUN: 9 mg/dL (ref 6–20)
CALCIUM: 8.3 mg/dL — AB (ref 8.9–10.3)
CO2: 22 mmol/L (ref 22–32)
CREATININE: 0.63 mg/dL (ref 0.61–1.24)
Chloride: 111 mmol/L (ref 101–111)
GFR calc Af Amer: >60 mL/min (ref >60)
GFR calc non Af Amer: >60 mL/min (ref >60)
GLUCOSE: 98 mg/dL (ref 65–99)
Potassium: 4.2 mmol/L (ref 3.5–5.1)
Sodium: 137 mmol/L (ref 135–145)

## 2014-12-02 LAB — CBC WITH DIFFERENTIAL/PLATELET
BASOS ABS: 0.1 10*3/uL (ref 0.0–0.1)
Basophils Relative: 1 %
EOS ABS: 0.7 10*3/uL (ref 0.0–0.7)
Eosinophils Relative: 5 %
HCT: 19.4 % — ABNORMAL LOW (ref 39.0–52.0)
Hemoglobin: 6.9 g/dL — CL (ref 13.0–17.0)
LYMPHS ABS: 3 10*3/uL (ref 0.7–4.0)
Lymphocytes Relative: 20 %
MCH: 32.2 pg (ref 26.0–34.0)
MCHC: 35.6 g/dL (ref 30.0–36.0)
MCV: 90.7 fL (ref 78.0–100.0)
MONO ABS: 1.6 10*3/uL — AB (ref 0.1–1.0)
Monocytes Relative: 11 %
NEUTROS ABS: 9.4 10*3/uL — AB (ref 1.7–7.7)
NEUTROS PCT: 63 %
PLATELETS: 366 10*3/uL (ref 150–400)
RBC: 2.14 MIL/uL — AB (ref 4.22–5.81)
RDW: 19.2 % — AB (ref 11.5–15.5)
WBC: 14.8 10*3/uL — AB (ref 4.0–10.5)
nRBC: 2 /100 WBC — ABNORMAL HIGH

## 2014-12-02 LAB — TROPONIN I
TROPONIN I: 0.41 ng/mL — AB (ref <0.031)
TROPONIN I: 0.43 ng/mL — AB (ref <0.031)
Troponin I: 0.51 ng/mL (ref <0.031)
Troponin I: 0.44 ng/mL — ABNORMAL HIGH (ref <0.031)

## 2014-12-02 LAB — RETICULOCYTES
RBC.: 2.14 MIL/uL — AB (ref 4.22–5.81)
RETIC COUNT ABSOLUTE: 447.3 10*3/uL — AB (ref 19.0–186.0)
RETIC CT PCT: 20.9 % — AB (ref 0.4–3.1)

## 2014-12-02 MED ORDER — HYDROMORPHONE HCL 4 MG PO TABS: 4.0000 mg | ORAL_TABLET | ORAL | Status: DC | PRN

## 2014-12-02 MED ORDER — ENOXAPARIN SODIUM 150 MG/ML ~~LOC~~ SOLN
120.0000 mg | SUBCUTANEOUS | Status: DC
  Filled 2014-12-02: qty 1

## 2014-12-02 MED ORDER — ENOXAPARIN SODIUM 120 MG/0.8ML ~~LOC~~ SOLN
120.0000 mg | SUBCUTANEOUS | Status: DC
Start: 2014-12-02 — End: 2014-12-03
  Administered 2014-12-02: 120 mg via SUBCUTANEOUS
  Filled 2014-12-02 (×2): qty 0.8

## 2014-12-02 MED ORDER — HYDROMORPHONE HCL 2 MG PO TABS
4.0000 mg | ORAL_TABLET | ORAL | Status: DC
  Administered 2014-12-02 – 2014-12-03 (×7): 4 mg via ORAL
  Filled 2014-12-02 (×7): qty 2

## 2014-12-02 NOTE — Progress Notes (Signed)
CRITICAL VALUE ALERT  Critical value received:  Hemoglobin 6.9  Date of notification:  12/02/2014  Time of notification:  09:15  Critical value read back:Yes.    Nurse who received alert:  Grant Fontana  MD notified (1st page):  Zigmund Daniel  Time of first page:  09:20  MD notified (2nd page):  Time of second page:  Responding MD:  Zigmund Daniel  Time MD responded:  10:00

## 2014-12-02 NOTE — Progress Notes (Signed)
ANTICOAGULATION - Brief Note  LMWH anti-Xa level  No Known Allergies  Patient Measurements: Height: 6' (182.9 cm) Weight: 165 lb (74.844 kg) IBW/kg (Calculated) : 77.6 Heparin Dosing Weight:   Labs:  Recent Labs  11/30/14 0910 11/30/14 0918 12/01/14 0333  12/01/14 2045 12/02/14 0245 12/02/14 0840  HGB 8.2* 8.5* 7.9*  --   --   --  6.9*  HCT 23.1* 25.0* 22.8*  --   --   --  19.4*  PLT 427*  --  413*  --   --   --  366  CREATININE 0.69 0.80  --   --   --   --  0.63  TROPONINI  --   --   --   < > 0.52* 0.51* 0.41*  < > = values in this interval not displayed.  Estimated Creatinine Clearance: 136.4 mL/min (by C-G formula based on Cr of 0.63).   Assessment: 40 YOM well-known to pharmacy with sickle cell disease and history of multiple PE.  Dr. Zigmund Daniel asked pharmacy the appropriate LMWH dose for patient.  Most recently on enoxaparin 110mg  (1.5mg /kg) SQ q24h.  CTA 10/30 reveals acute PE.  LMWH anti-Xa level drawn 4h after 10/31 dose was low at 0.42.  This was drawn after his second dose while in hospital, not reliable compliance to include prior to admission dosing.    Goal of Therapy:  Anti-Xa level 1-2 units/ml 4hrs after LMWH dose given  Plan:   Discussed with Dr. Zigmund Daniel and suggest increasing enoxaparin by ~10% to 120mg  SQ q24h based on low anti-Xa level.    Doreene Eland, PharmD, BCPS.   Pager: [phone] 12/02/2014 11:03 AM

## 2014-12-02 NOTE — Telephone Encounter (Signed)
Left HIPPA-compliant message to return call to [facility] from [facility] at [phone].

## 2014-12-02 NOTE — Progress Notes (Signed)
SICKLE CELL SERVICE PROGRESS NOTE  BRANDYN LOWREY CBU:[id number] DOB: 1980/12/17 DOA: 11/30/2014 PCP: [name], MD  Assessment/Plan: Active Problems:   Hb-SS disease with crisis ([place])   Sickle cell pain crisis ([place])   Pulmonary embolus ([place])   Syncope  1. Syncope: Pt has had no further episodes of syncope. I feel that this is likely secondary to hypoxia associated with chronic hypoxic respiratory failure and non-compliance with Oxygen use.  2. Hb SS with crisis: Pt presented in crisis again likely precipitated by hypoxemia. He has used 33 mg of Dilaudid and 62/55: demands/deliveries. I will continue Toradol and PCA at current dose. Will schedule oral Dilaudid and hopefully will be able to discharge home tomorrow. 3. Acute on Chronic Respiratory Failure with Hypoxia: Pt ahs not been compliant with Oxygen use. Currently he is requiring 5 liters to maintain Oxygen at 92%. This is above his baseline and is likely caused by the new PE found on CT or a worsening of pulmonary HTN due to non-compliance with Oxygen. Pt not a candidate for arterial vasodilators. 4. Recurrent Pulmonary Embolus: Heparin LMW XA levels were low 4 hours after. I have discussed with patient and he does not think that he would be able to comply with BID dosing of Lovenox. I spoke with Pharmacy and they recommend increasing dose to 120 mg daily. Will change dose to 120 mg and monitor foe any bleeding. 5. Anemia of chronic disease: Pt has is at baseline Hb of 6.5-7. Currently at baseline. 6. Chronic pain: Continue MS Contin. 7. Medication non-compliance: See discussion above regarding oxygen. I have spoken with Dr. Doreene Burke about this patient's condition and the prognosis associated with his pulmonary HTN. I believe that his life expectancy is quite limited (<6 month) if he continues to be non-compliant with the Oxygen use. Also he keeps having recurrent PE's which will likely only be prevented by serial exchange  transfusions to decrease the percentage of Hb S- which in itself predisposes to hypercoagulability due to cell surface properties. I have discussed the above with Dr. Doreene Burke and have recommended that a conversation about advance directives and palliative care be considered in the out patient setting.  Code Status: Full Code Family Communication: N/A Disposition Plan: Anticipate discharge tomorrow.  Xitlally Mooneyham A.  Pager [phone]. If 7PM-7AM, please contact night-coverage.  12/02/2014, 10:52 AM  LOS: 1 day    Consultants:  Pulmonary  Procedures:  None  Antibiotics:  None  HPI/Subjective: Pt reports pain at 6-7/10 and localized to ribs bilaterally. He denies dizziness or lightheadedness.  Objective: Filed Vitals:   12/02/14 0418 12/02/14 0450 12/02/14 0800 12/02/14 1013  BP:  102/70  113/86  Pulse:  71  64  Temp:  98.8 F (37.1 C)    TempSrc:  Oral    Resp: 21 14 12 16   Height:      Weight:      SpO2: 89% 90% 94% 94%   Weight change:   Intake/Output Summary (Last 24 hours) at 12/02/14 1052 Last data filed at 12/02/14 1002  Gross per 24 hour  Intake   4215 ml  Output   1550 ml  Net   2665 ml    General: Alert, awake, oriented x3, in no acute distress.  HEENT: /AT PEERL, EOMI, mild icterus OROPHARYNX:  Moist, No exudate/ erythema/lesions.  Heart: Regular rate and rhythm, without murmurs, rubs, gallops, hyperdynamic precordium. Lungs: Clear to auscultation, no wheezing or rhonchi noted. No increased vocal fremitus resonant to percussion  Abdomen: Soft,  nontender, nondistended, positive bowel sounds, no masses no hepatosplenomegaly noted.  Neuro: No focal neurological deficits noted cranial nerves II through XII grossly intact. . Strength at baseline in bilateral upper and lower extremities. Musculoskeletal: No warm swelling or erythema around joints, no spinal tenderness noted. Psychiatric: Patient alert and oriented x3, good insight and cognition, good  recent to remote recall.    Data Reviewed: Basic Metabolic Panel:  Recent Labs Lab 11/29/14 1740 11/30/14 0910 11/30/14 0918 11/30/14 1356 12/02/14 0840  NA 136 137 141  --  137  K 3.6 3.5 3.5  --  4.2  CL 108 110 106  --  111  CO2 22 22  --   --  22  GLUCOSE 102* 117* 114*  --  98  BUN 7 11 11   --  9  CREATININE 0.51* 0.69 0.80  --  0.63  CALCIUM 9.0 8.8*  --   --  8.3*  MG  --   --   --  1.5*  --    Liver Function Tests:  Recent Labs Lab 11/29/14 1740 11/30/14 0910  AST 43* 45*  ALT 23 23  ALKPHOS 82 82  BILITOT 7.9* 7.2*  PROT 7.0 7.2  ALBUMIN 3.9 3.8   No results for input(s): LIPASE, AMYLASE in the last 168 hours. No results for input(s): AMMONIA in the last 168 hours. CBC:  Recent Labs Lab 11/28/14 1020 11/29/14 1740 11/30/14 0910 11/30/14 0918 12/01/14 0333 12/02/14 0840  WBC 15.4* 14.3* 15.5*  --  16.5* 14.8*  NEUTROABS 10.6* 10.0* 11.2*  --  9.8* 9.4*  HGB 7.4* 7.9* 8.2* 8.5* 7.9* 6.9*  HCT 20.5* 22.2* 23.1* 25.0* 22.8* 19.4*  MCV 89.9 87.7 87.5  --  90.5 90.7  PLT 356 413* 427*  --  413* 366   Cardiac Enzymes:  Recent Labs Lab 12/01/14 1035 12/01/14 1540 12/01/14 2045 12/02/14 0245 12/02/14 0840  TROPONINI 0.51* 0.49* 0.52* 0.51* 0.41*   BNP (last 3 results)  Recent Labs  08/29/14 0810 09/10/14 0612 11/30/14 0910  BNP 1117.0* 549.5* 639.7*    ProBNP (last 3 results)  Recent Labs  01/01/14 0909  PROBNP 5790.0*    CBG: No results for input(s): GLUCAP in the last 168 hours.  Recent Results (from the past 240 hour(s))  MRSA PCR Screening     Status: None   Collection Time: 11/24/14  4:39 PM  Result Value Ref Range Status   MRSA by PCR NEGATIVE NEGATIVE Final    Comment:        The GeneXpert MRSA Assay (FDA approved for NASAL specimens only), is one component of a comprehensive MRSA colonization surveillance program. It is not intended to diagnose MRSA infection nor to guide or monitor treatment for MRSA  infections.      Studies: Dg Chest 2 View  11/26/2014  CLINICAL DATA:  Sickle cell crisis, shortness of breath. EXAM: CHEST  2 VIEW COMPARISON:  Chest x-ray of November 25, 2014 FINDINGS: The cardiopericardial silhouette remains enlarged. The pulmonary vascularity is less engorged today. The lungs are well-expanded. There is no focal infiltrate or pleural effusion. The power port appliance tip is stable projecting over the distal third of the SVC just proximal to the cavoatrial junction. There are stable coarse retrocardiac lung markings. There is multilevel degenerative disc disease of the thoracic spine. IMPRESSION: Improved appearance of the pulmonary vascularity and pulmonary interstitium consistent with resolution of pulmonary edema. Stable cardiomegaly. There is no evidence of pneumonia. Electronically Signed   By:  David  Martinique M.D.   On: 11/26/2014 15:30   Dg Chest 2 View  11/15/2014  CLINICAL DATA:  Back pain.  Sickle cell anemia. EXAM: CHEST  2 VIEW COMPARISON:  11/13/2014 FINDINGS: Cardiomegaly. Left subclavian vein Port-A-Cath is stable. No pneumothorax. Chronic changes at the lung bases. IMPRESSION: No active cardiopulmonary disease. Electronically Signed   By: Marybelle Killings M.D.   On: 11/15/2014 08:52   Dg Chest 2 View  11/13/2014  CLINICAL DATA:  Sickle cell pain crisis with left lateral chest wall pain EXAM: CHEST  2 VIEW COMPARISON:  10/26/2014 FINDINGS: Chronic cardiomegaly. Negative aortic and hilar contours. Stable left subclavian porta catheter placement, tip at the upper right atrium. Chronic interstitial coarsening consistent with scar. There is no edema, consolidation, effusion, or pneumothorax. No osseous findings to explain acute pain. Chronic humeral head osteonecrosis. Cholecystectomy. IMPRESSION: Stable lung scarring and cardiomegaly. No evidence of acute cardiopulmonary disease. Electronically Signed   By: Monte Fantasia M.D.   On: 11/13/2014 08:15   Dg Lumbar Spine  Complete  11/15/2014  CLINICAL DATA:  Sickle cell pain crisis EXAM: LUMBAR SPINE - COMPLETE 4+ VIEW COMPARISON:  None. FINDINGS: Five lumbar type vertebral bodies. Straightening of the lumbar spine. No evidence of fracture or dislocation. Vertebral body heights are maintained. Mild superior/ inferior endplate changes at L5 characteristic of sickle cell. Visualized bony pelvis appears intact. IMPRESSION: No fracture or dislocation is seen. Electronically Signed   By: Julian Hy M.D.   On: 11/15/2014 11:22   Dg Pelvis 1-2 Views  11/15/2014  CLINICAL DATA:  Sickle cell pain crisis EXAM: PELVIS - 1-2 VIEW COMPARISON:  CT right hip dated 10/27/2013 FINDINGS: Right hip arthroplasty in satisfactory position. No fracture or dislocation is seen. Left hip joint space is preserved. Visualized bony pelvis appears intact. IMPRESSION: Right hip arthroplasty satisfactory position. No fracture or dislocation is seen. Electronically Signed   By: Julian Hy M.D.   On: 11/15/2014 11:19   Ct Angio Chest W/cm &/or Wo Cm  11/30/2014  CLINICAL DATA:  Shortness of breath. EXAM: CT ANGIOGRAPHY CHEST WITH CONTRAST TECHNIQUE: Multidetector CT imaging of the chest was performed using the standard protocol during bolus administration of intravenous contrast. Multiplanar CT image reconstructions and MIPs were obtained to evaluate the vascular anatomy. CONTRAST:  12mL OMNIPAQUE IOHEXOL 350 MG/ML SOLN COMPARISON:  10/26/2014 FINDINGS: Mediastinum: Overall examination is suboptimal. Poor pulmonary arterial opacification and respiratory motion artifact diminishes exam detail. The heart size is moderately enlarged. No pericardial effusion. Prominent anterior mediastinal lymph nodes are again noted, unchanged from previous exam compatible with adenopathy. The trachea appears patent and is midline. Unremarkable appearance of the esophagus. Left upper lobe segmental pulmonary artery filling defect is identified, image 88 of  series 14. Lungs/Pleura: No pleural effusion identified. There is atelectasis noted in both lung bases. No airspace consolidation. Upper Abdomen: The visualized portions of the liver are diffusely increased and attenuation. Calcified spleen noted. Musculoskeletal: Bony stigmata of sickle cell disease identified. Review of the MIP images confirms the above findings. IMPRESSION: 1. Exam detail diminished secondary to respiratory motion artifact and poor pulmonary arterial opacification. 2. Examination is positive for at least 1 segmental branch pulmonary artery filling defect compatible with acute pulmonary embolus. 3. Chronic changes of sickle cell disease. Electronically Signed   By: Kerby Moors M.D.   On: 11/30/2014 10:28   Ct Angio Chest Pe W/cm &/or Wo Cm  11/13/2014  CLINICAL DATA:  Chest pain.  Sickle cell disease EXAM: CT  ANGIOGRAPHY CHEST WITH CONTRAST TECHNIQUE: Multidetector CT imaging of the chest was performed using the standard protocol during bolus administration of intravenous contrast. Multiplanar CT image reconstructions and MIPs were obtained to evaluate the vascular anatomy. CONTRAST:  16mL OMNIPAQUE IOHEXOL 350 MG/ML SOLN COMPARISON:  Chest CT October 26, 2014 and November 13, 2014 chest radiograph FINDINGS: There is a small pulmonary embolus in the posterior segment right lower lobe pulmonary artery. No more central pulmonary emboli are identified. No evidence of right heart strain. There is no thoracic aortic aneurysm or dissection. The visualized great vessels appear normal. There is a small area of patchy infiltrate in the anterior segment of the left upper lobe, stable. There is patchy atelectatic change in both bases, stable. No new opacity is appreciable. Cardiomegaly is stable.  The pericardium is not thickened. Visualized thyroid appears normal. Anterior mediastinal adenopathy remains, stable. The largest individual lymph node in the anterior mediastinum measures 2.3 x 1.5 cm,  unchanged. There is a single prominent lymph node in the right hilum measuring 1.6 x 1.2 cm. There is no new adenopathy. Central catheter tip is near the cavoatrial junction. In the visualized upper abdomen, there is diffuse calcification of the spleen consistent with a degree of autoinfarction, stable. 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 Bones overall are sclerotic consistent with the chronic anemia. There is a probable sebaceous cyst measuring 2.1 x 1.1 cm in the right anterior chest wall, stable. Review of the MIP images confirms the above findings. IMPRESSION: Small pulmonary embolus in a posterior segment right lower lobe pulmonary artery branch, not present recently and presumed new. No other pulmonary emboli identified. Stable cardiomegaly and anterior mediastinal adenopathy. Single prominent lymph node in the right hilum. Stable areas of patchy lower lobe atelectasis and mild patchy infiltrate in the left upper lobe. No new parenchymal lung opacity. 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 prior splenic autoinfarction. Critical Value/emergent results were called by telephone at the time of interpretation on 11/13/2014 at 12:32 pm to Dr. Joseph Berkshire , who verbally acknowledged these results. Electronically Signed   By: Lowella Grip III M.D.   On: 11/13/2014 12:34   Dg Chest Port 1 View  11/25/2014  CLINICAL DATA:  Current history of sickle cell disease, presenting with hypoxia. Recent diagnosis of pulmonary embolus. EXAM: PORTABLE CHEST 1 VIEW COMPARISON:  Chest x-ray 11/15/2014 and earlier. CTA chest 11/13/2014. CT chest 10/26/2014 and earlier. FINDINGS: Cardiac silhouette massively enlarged, unchanged. Widening of the superior mediastinum, unchanged, shown on prior CT to be due to anterior mediastinal lymphadenopathy. Stable scarring in the lung bases. Lungs otherwise clear. No localized airspace  consolidation. No pleural effusions. No pneumothorax. Normal pulmonary vascularity. Left subclavian Port-A-Cath tip projects at the cavoatrial junction, unchanged. IMPRESSION: Stable massive cardiomegaly.  No acute cardiopulmonary disease. Electronically Signed   By: Evangeline Dakin M.D.   On: 11/25/2014 10:01    Scheduled Meds: . aspirin  81 mg Oral Daily  . cholecalciferol  2,000 Units Oral Daily  . enoxaparin  120 mg Subcutaneous Q24H  . folic acid  1 mg Oral q morning - 10a  . HYDROmorphone   Intravenous 6 times per day  . HYDROmorphone  4 mg Oral Q4H  . hydroxyurea  500 mg Oral Daily  . ketorolac  30 mg Intravenous 4 times per day  . lisinopril  10 mg Oral Daily  . metoprolol succinate  50 mg Oral Daily  .  morphine  30 mg Oral Q12H  . potassium chloride SA  20 mEq Oral q morning - 10a  . senna-docusate  1 tablet Oral BID  . sodium chloride  3 mL Intravenous Q12H   Continuous Infusions: . dextrose 5 % and 0.45% NaCl 75 mL/hr at 12/02/14 0035    Time spent 30 minutes

## 2014-12-03 ENCOUNTER — Ambulatory Visit: Payer: Medicare Other | Admitting: Cardiovascular Disease

## 2014-12-03 DIAGNOSIS — I2609 Other pulmonary embolism with acute cor pulmonale: Secondary | ICD-10-CM

## 2014-12-03 LAB — CBC WITH DIFFERENTIAL/PLATELET
BASOS PCT: 1 %
Basophils Absolute: 0.1 10*3/uL (ref 0.0–0.1)
EOS ABS: 0.7 10*3/uL (ref 0.0–0.7)
Eosinophils Relative: 6 %
HCT: 19.1 % — ABNORMAL LOW (ref 39.0–52.0)
HEMOGLOBIN: 6.8 g/dL — AB (ref 13.0–17.0)
Lymphocytes Relative: 21 %
Lymphs Abs: 2.3 10*3/uL (ref 0.7–4.0)
MCH: 32.4 pg (ref 26.0–34.0)
MCHC: 35.6 g/dL (ref 30.0–36.0)
MCV: 91 fL (ref 78.0–100.0)
MONO ABS: 1.5 10*3/uL — AB (ref 0.1–1.0)
MONOS PCT: 14 %
NEUTROS PCT: 58 %
Neutro Abs: 6.3 10*3/uL (ref 1.7–7.7)
Platelets: 386 10*3/uL (ref 150–400)
RBC: 2.1 MIL/uL — ABNORMAL LOW (ref 4.22–5.81)
RDW: 18.4 % — AB (ref 11.5–15.5)
WBC: 10.9 10*3/uL — ABNORMAL HIGH (ref 4.0–10.5)

## 2014-12-03 LAB — TROPONIN I: TROPONIN I: 0.42 ng/mL — AB (ref <0.031)

## 2014-12-03 MED ORDER — ENOXAPARIN SODIUM 120 MG/0.8ML ~~LOC~~ SOLN: 120.0000 mg | SUBCUTANEOUS | Status: DC

## 2014-12-03 MED ORDER — HEPARIN SOD (PORK) LOCK FLUSH 100 UNIT/ML IV SOLN
500.0000 [IU] | INTRAVENOUS | Status: AC | PRN
  Administered 2014-12-03: 500 [IU]

## 2014-12-03 NOTE — Discharge Instructions (Signed)
Pulmonary Hypertension Pulmonary hypertension is high blood pressure within the arteries in your lungs (pulmonary arteries). It is different than having high blood pressure elsewhere in your body, such as blood pressure that is measured with a blood pressure cuff. Pulmonary hypertension makes it harder for blood to flow through the lungs. As a result, the heart must work harder to pump blood through the lungs, and it may be harder for you to breathe. Over time, this can weaken the heart muscle. Pulmonary hypertension is a serious condition and it can be fatal.  CAUSES Many different medical conditions can cause pulmonary hypertension. Pulmonary hypertension can be categorized by cause into five groups: Group 1 Pulmonary hypertension that is caused by abnormal growth of small blood vessels in the lungs (pulmonary arterial hypertension). The abnormal blood vessel growth may have no known cause, or it may be:  Passed along from a parent (hereditary).  Caused by another disease, such as a connective tissue disease (including lupus or scleroderma) or HIV.  Caused by certain drugs or toxins. Group 2 Pulmonary hypertension that is caused by weakness of the main chamber of the heart (left ventricle) or heart valve disease. Group 3 Pulmonary hypertension that is caused by lung disease or low oxygen levels. Causes in this group include:  Emphysema or chronic obstructive pulmonary disease (COPD).  Untreated sleep apnea.  Pulmonary fibrosis. Group 4 Pulmonary hypertension that is caused by blood clots in the lungs (pulmonary emboli).  Group 5 Other causes of pulmonary hypertension, such as sickle cell anemia, or a mix of multiple causes. SYMPTOMS Symptoms of this condition include:  Shortness of breath. You may notice shortness of breath with:  Activity, such as walking.  No activity.  Tiredness and fatigue.  Dizziness or fainting.  Rapid heartbeat or feeling your heart flutter or skip a  beat (palpitations).  Neck vein enlargement.  Bluish color to your lips and fingertips. DIAGNOSIS This condition may be diagnosed by:  Chest X-ray.  Arterial blood gases. This test checks the acidity of your blood as well as your blood oxygen and carbon dioxide levels.  CT scan. This test can provide detailed images of your lungs.  Pulmonary function test. This test measures how much air your lungs can hold. It also tests how well air moves in and out of your lungs.  Electrocardiogram (ECG). This test traces the electrical activity of your heart.  Echocardiogram. This test is used to look at your heart in motion and check how it is functioning.  Heart catheterization. This test can measure the pressure in your pulmonary artery and the right side of your heart.  Lung biopsy. This procedure involves checking a sample of lung tissue to find underlying causes. TREATMENT There is no cure for pulmonary hypertension, but treatment can help to relieve symptoms and slow the progress of the condition. Treatment can involve:  Medicines, such as:  Blood pressure medicines.  Medicines to relax (dilate) the pulmonary blood vessels.  Water pills to get rid of extra fluid (diuretic medicines).  Blood-thinning medicines.  Surgery. For severe pulmonary hypertension that does not respond to medical treatment, heart-lung or lung transplant may be needed. HOME CARE INSTRUCTIONS  Take medicines only as directed by your health care provider. These include over-the-counter medicines and prescription medicines. Take all medicines exactly as instructed. Do not change or stop medicines without first checking with your health care provider.  Do not smoke. If you need help quitting, ask your health care provider.  Eat a   healthy diet.  Limit your salt (sodium) intake to less than 2,300 mg per day.  Stay as active as possible. Exercise as directed by your health care provider. Talk with your health  care provider about what type of exercise is safe for you.  Avoid high altitudes.  Avoid hot tubs and saunas.  Avoid becoming pregnant, if this applies. Talk with your health care provider about safe methods of birth control.  Keep all follow-up visits as directed by your health care provider. This is important. SEEK IMMEDIATE MEDICAL CARE IF:  You have severe shortness of breath.  You develop chest pain or pressure in your chest.  You cough up blood.  You develop swelling of your feet or legs.  You have a significant increase in weight within 1-2 days.   This information is not intended to replace advice given to you by your health care provider. Make sure you discuss any questions you have with your health care provider.   Document Released: 11/14/2006 Document Revised: 06/03/2014 Document Reviewed: 07/09/2012 Elsevier Interactive Patient Education 2016 [employer]  

## 2014-12-03 NOTE — Progress Notes (Signed)
PHARMACIST - PHYSICIAN COMMUNICATION  Key Points: Use following P&T approved IV to PO diphenhydramine (Benadryl) policy. Description contains the criteria that are approved  DR:   Zigmund Daniel CONCERNING: IV to Oral Route Change Policy  RECOMMENDATION: This patient is receiving diphenhydramine by the intravenous route.  Based on criteria approved by the Pharmacy and Therapeutics Committee, intravenous diphenhydramine is being converted to the equivalent oral dose form(s).   DESCRIPTION: These criteria include:  Diphenhydramine is not prescribed to treat or prevent a severe allergic reaction  Diphenhydramine is not prescribed as premedication prior to receiving blood product, biologic medication, antimicrobial, or chemotherapy agent  The patient has taken at least one dose of an oral or enteral medication  The patient has no evidence of active gastrointestinal bleeding or impaired GI absorption (gastrectomy, short bowel, patient on TNA or NPO)  The patient is not undergoing procedural sedation   If you have questions about this conversion, please contact the Pharmacy Department  []   [phone] )  [facility] []   [phone] )  [facility] []   [phone] )  [facility] []   [phone] )  [facility] [x]   [phone] )  [place], PharmD, BCPS.   Pager: [phone] 12/03/2014 9:13 AM

## 2014-12-03 NOTE — Discharge Summary (Signed)
[name] MRN: [id number] DOB/AGE: 07-24-1980 34 y.o.  Admit date: 11/30/2014 Discharge date: 12/03/2014  Primary Care Physician:  [name], MD   Discharge Diagnoses:   Patient Active Problem List   Diagnosis Date Noted  . Atrial fibrillation with RVR ([place]) 08/29/2014    Priority: High  . Cor pulmonale, chronic ([place]) 08/25/2014    Priority: High  . Pulmonary HTN ([place]) 06/18/2013    Priority: High  . Functional asplenia     Priority: High  . Anemia of chronic disease 06/25/2014    Priority: Medium  . Hypoxia 03/14/2014    Priority: Medium  . Hb-SS disease with crisis ([place]) 01/22/2014    Priority: Medium  . Chronic anticoagulation 08/22/2013    Priority: Medium  . Avascular necrosis ([place])     Priority: Low  . Pulmonary emboli ([place]) 11/30/2014  . Pulmonary embolus ([place]) 11/30/2014  . Syncope 11/30/2014  . Sickle cell pain crisis ([place]) 11/23/2014  . Chest pain   . Hb-SS disease without crisis ([place]) 10/07/2014  . Acute respiratory failure with hypoxia ([place])   . Acute chest syndrome in sickle crisis ([place])   . PVC's (premature ventricular contractions) 09/10/2014  . Abnormal EKG 09/10/2014  . Hypoxemia   . Chronic atrial fibrillation ([place]) 09/09/2014  . Sickle cell crisis ([place]) 09/08/2014  . Bacteremia   . Elevated troponin I level 08/29/2014  . Ankle edema 07/07/2014  . Sickle cell anemia ([place]) 06/25/2014  . PAH (pulmonary artery hypertension) ([place]) 03/18/2014  . Paralytic strabismus, external ophthalmoplegia   . Chronic pain syndrome 12/12/2013  . Essential hypertension 08/22/2013  . Vitamin D deficiency 02/13/2013  . Uses marijuana 01/13/2013  . Embolism, pulmonary with infarction ([place]) 07/09/2012  . Hx of pulmonary embolus 06/29/2012  . Hemochromatosis 12/14/2011    DISCHARGE MEDICATION:   Medication List    TAKE these medications        aspirin 81 MG chewable tablet  Chew 1 tablet (81 mg total) by mouth daily.     enoxaparin 120  MG/0.8ML injection  Commonly known as:  LOVENOX  Inject 0.8 mLs (120 mg total) into the skin daily.     folic acid 1 MG tablet  Commonly known as:  FOLVITE  Take 1 tablet (1 mg total) by mouth every morning.     HYDROmorphone 4 MG tablet  Commonly known as:  DILAUDID  Take 1 tablet (4 mg total) by mouth every 4 (four) hours as needed for severe pain.     hydroxyurea 500 MG capsule  Commonly known as:  HYDREA  Take 1 capsule (500 mg total) by mouth daily. May take with food to minimize GI side effects.     lisinopril 10 MG tablet  Commonly known as:  PRINIVIL,ZESTRIL  Take 1 tablet (10 mg total) by mouth daily.     metoprolol succinate 50 MG 24 hr tablet  Commonly known as:  TOPROL-XL  Take 1 tablet (50 mg total) by mouth daily.     morphine 30 MG 12 hr tablet  Commonly known as:  MS CONTIN  Take 1 tablet (30 mg total) by mouth every 12 (twelve) hours.     potassium chloride SA 20 MEQ tablet  Commonly known as:  K-DUR,KLOR-CON  Take 1 tablet (20 mEq total) by mouth every morning.     Vitamin D 2000 UNITS tablet  Take 1 tablet (2,000 Units total) by mouth daily.     zolpidem 10 MG tablet  Commonly known as:  AMBIEN  Take 1 tablet (10 mg total) by mouth at bedtime as needed for sleep.          Consults:     SIGNIFICANT DIAGNOSTIC STUDIES:  Dg Chest 2 View  11/26/2014  CLINICAL DATA:  Sickle cell crisis, shortness of breath. EXAM: CHEST  2 VIEW COMPARISON:  Chest x-ray of November 25, 2014 FINDINGS: The cardiopericardial silhouette remains enlarged. The pulmonary vascularity is less engorged today. The lungs are well-expanded. There is no focal infiltrate or pleural effusion. The power port appliance tip is stable projecting over the distal third of the SVC just proximal to the cavoatrial junction. There are stable coarse retrocardiac lung markings. There is multilevel degenerative disc disease of the thoracic spine. IMPRESSION: Improved appearance of the pulmonary  vascularity and pulmonary interstitium consistent with resolution of pulmonary edema. Stable cardiomegaly. There is no evidence of pneumonia. Electronically Signed   By: David  Martinique M.D.   On: 11/26/2014 15:30   Dg Chest 2 View  11/15/2014  CLINICAL DATA:  Back pain.  Sickle cell anemia. EXAM: CHEST  2 VIEW COMPARISON:  11/13/2014 FINDINGS: Cardiomegaly. Left subclavian vein Port-A-Cath is stable. No pneumothorax. Chronic changes at the lung bases. IMPRESSION: No active cardiopulmonary disease. Electronically Signed   By: Marybelle Killings M.D.   On: 11/15/2014 08:52   Dg Chest 2 View  11/13/2014  CLINICAL DATA:  Sickle cell pain crisis with left lateral chest wall pain EXAM: CHEST  2 VIEW COMPARISON:  10/26/2014 FINDINGS: Chronic cardiomegaly. Negative aortic and hilar contours. Stable left subclavian porta catheter placement, tip at the upper right atrium. Chronic interstitial coarsening consistent with scar. There is no edema, consolidation, effusion, or pneumothorax. No osseous findings to explain acute pain. Chronic humeral head osteonecrosis. Cholecystectomy. IMPRESSION: Stable lung scarring and cardiomegaly. No evidence of acute cardiopulmonary disease. Electronically Signed   By: Monte Fantasia M.D.   On: 11/13/2014 08:15   Dg Lumbar Spine Complete  11/15/2014  CLINICAL DATA:  Sickle cell pain crisis EXAM: LUMBAR SPINE - COMPLETE 4+ VIEW COMPARISON:  None. FINDINGS: Five lumbar type vertebral bodies. Straightening of the lumbar spine. No evidence of fracture or dislocation. Vertebral body heights are maintained. Mild superior/ inferior endplate changes at L5 characteristic of sickle cell. Visualized bony pelvis appears intact. IMPRESSION: No fracture or dislocation is seen. Electronically Signed   By: Julian Hy M.D.   On: 11/15/2014 11:22   Dg Pelvis 1-2 Views  11/15/2014  CLINICAL DATA:  Sickle cell pain crisis EXAM: PELVIS - 1-2 VIEW COMPARISON:  CT right hip dated 10/27/2013  FINDINGS: Right hip arthroplasty in satisfactory position. No fracture or dislocation is seen. Left hip joint space is preserved. Visualized bony pelvis appears intact. IMPRESSION: Right hip arthroplasty satisfactory position. No fracture or dislocation is seen. Electronically Signed   By: Julian Hy M.D.   On: 11/15/2014 11:19   Ct Angio Chest W/cm &/or Wo Cm  11/30/2014  CLINICAL DATA:  Shortness of breath. EXAM: CT ANGIOGRAPHY CHEST WITH CONTRAST TECHNIQUE: Multidetector CT imaging of the chest was performed using the standard protocol during bolus administration of intravenous contrast. Multiplanar CT image reconstructions and MIPs were obtained to evaluate the vascular anatomy. CONTRAST:  147mL OMNIPAQUE IOHEXOL 350 MG/ML SOLN COMPARISON:  10/26/2014 FINDINGS: Mediastinum: Overall examination is suboptimal. Poor pulmonary arterial opacification and respiratory motion artifact diminishes exam detail. The heart size is moderately enlarged. No pericardial effusion. Prominent anterior mediastinal lymph nodes are again noted, unchanged from previous exam compatible with adenopathy. The trachea  appears patent and is midline. Unremarkable appearance of the esophagus. Left upper lobe segmental pulmonary artery filling defect is identified, image 88 of series 14. Lungs/Pleura: No pleural effusion identified. There is atelectasis noted in both lung bases. No airspace consolidation. Upper Abdomen: The visualized portions of the liver are diffusely increased and attenuation. Calcified spleen noted. Musculoskeletal: Bony stigmata of sickle cell disease identified. Review of the MIP images confirms the above findings. IMPRESSION: 1. Exam detail diminished secondary to respiratory motion artifact and poor pulmonary arterial opacification. 2. Examination is positive for at least 1 segmental branch pulmonary artery filling defect compatible with acute pulmonary embolus. 3. Chronic changes of sickle cell disease.  Electronically Signed   By: Kerby Moors M.D.   On: 11/30/2014 10:28   Ct Angio Chest Pe W/cm &/or Wo Cm  11/13/2014  CLINICAL DATA:  Chest pain.  Sickle cell disease EXAM: CT ANGIOGRAPHY CHEST WITH CONTRAST TECHNIQUE: Multidetector CT imaging of the chest was performed using the standard protocol during bolus administration of intravenous contrast. Multiplanar CT image reconstructions and MIPs were obtained to evaluate the vascular anatomy. CONTRAST:  151mL OMNIPAQUE IOHEXOL 350 MG/ML SOLN COMPARISON:  Chest CT October 26, 2014 and November 13, 2014 chest radiograph FINDINGS: There is a small pulmonary embolus in the posterior segment right lower lobe pulmonary artery. No more central pulmonary emboli are identified. No evidence of right heart strain. There is no thoracic aortic aneurysm or dissection. The visualized great vessels appear normal. There is a small area of patchy infiltrate in the anterior segment of the left upper lobe, stable. There is patchy atelectatic change in both bases, stable. No new opacity is appreciable. Cardiomegaly is stable.  The pericardium is not thickened. Visualized thyroid appears normal. Anterior mediastinal adenopathy remains, stable. The largest individual lymph node in the anterior mediastinum measures 2.3 x 1.5 cm, unchanged. There is a single prominent lymph node in the right hilum measuring 1.6 x 1.2 cm. There is no new adenopathy. Central catheter tip is near the cavoatrial junction. In the visualized upper abdomen, there is diffuse calcification of the spleen consistent with a degree of autoinfarction, stable. 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 Bones overall are sclerotic consistent with the chronic anemia. There is a probable sebaceous cyst measuring 2.1 x 1.1 cm in the right anterior chest wall, stable. Review of the MIP images confirms the above findings. IMPRESSION: Small pulmonary embolus in a  posterior segment right lower lobe pulmonary artery branch, not present recently and presumed new. No other pulmonary emboli identified. Stable cardiomegaly and anterior mediastinal adenopathy. Single prominent lymph node in the right hilum. Stable areas of patchy lower lobe atelectasis and mild patchy infiltrate in the left upper lobe. No new parenchymal lung opacity. 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 prior splenic autoinfarction. Critical Value/emergent results were called by telephone at the time of interpretation on 11/13/2014 at 12:32 pm to Dr. Joseph Berkshire , who verbally acknowledged these results. Electronically Signed   By: Lowella Grip III M.D.   On: 11/13/2014 12:34   Dg Chest Port 1 View  11/25/2014  CLINICAL DATA:  Current history of sickle cell disease, presenting with hypoxia. Recent diagnosis of pulmonary embolus. EXAM: PORTABLE CHEST 1 VIEW COMPARISON:  Chest x-ray 11/15/2014 and earlier. CTA chest 11/13/2014. CT chest 10/26/2014 and earlier. FINDINGS: Cardiac silhouette massively enlarged, unchanged. Widening of the superior mediastinum, unchanged, shown on prior CT to be due to  anterior mediastinal lymphadenopathy. Stable scarring in the lung bases. Lungs otherwise clear. No localized airspace consolidation. No pleural effusions. No pneumothorax. Normal pulmonary vascularity. Left subclavian Port-A-Cath tip projects at the cavoatrial junction, unchanged. IMPRESSION: Stable massive cardiomegaly.  No acute cardiopulmonary disease. Electronically Signed   By: Evangeline Dakin M.D.   On: 11/25/2014 10:01      Recent Results (from the past 240 hour(s))  MRSA PCR Screening     Status: None   Collection Time: 11/24/14  4:39 PM  Result Value Ref Range Status   MRSA by PCR NEGATIVE NEGATIVE Final    Comment:        The GeneXpert MRSA Assay (FDA approved for NASAL specimens only), is one component of a comprehensive MRSA  colonization surveillance program. It is not intended to diagnose MRSA infection nor to guide or monitor treatment for MRSA infections.     BRIEF ADMITTING H & P: Pt was discharged from the hospital 2 days ago with strict instructions to wear his Oxygen ATC. Pt states that when he arrived at home he realized that his Oxygen tank was empty. He states that he was feeling well at the time of discharge and for about 24 hours and then started feeling light headed. He had a syncopal episode which lasted just a few seconds and then had 2 episodes of emesis which were nonbilious and nonhematematous. He also noted that he felt as though he was having increased DOE. He denies missing any doses of Lovenox and reports compliance with medications except for the Oxygen. He states that since the episode he has had increased pain in his ribs that is consistent with that off a crisis. The pain is throbbing and sharp in nature and localized to his ribs. It is non-radiating and non-pleuritic in nature. He has no associated symptoms except the events noted above. Specifically he denies fever, chills, diarrhea, HA, abdominal pain.   In the ED he was noted to have Oxygen saturations of 98% on 5L of Oxygen.. After requesting decrease in Oxygen to 4 l/min his saturations were around 93%. Labs and radiological studies done in the ED were reviewed and the only significant new finding is a small PE on CT angiogram and a very mildly elevated Troponin. I have discussed his case with Dr. Billy Fischer in the ED and patient is being admitted for observation.     Hospital Course:  Present on Admission:  . Syncope: Pt reported an episode of syncope since last hospitalization. He had no focal neurological deficits an the episode was felt to be secondary to hypoxemia as patient had preceding DOE and was without his Oxygen. He was found to have a small recent PE during the ED evaluation but this was not felt to be the reason for  syncope. He was observed on telemetry and no serious arhthymia was identified. I would recommend an event monitor if he has any further episodes while wearing his Oxygen. I had a long discussion with patient about the importance of  Wearing Oxygen at all times.  Mar[place] Kitchen Hb-SS disease with crisis [facility]): Pt was treated with Dilaudid via PCA, Toradol and IVF. As pain improved, he was transitioned to oral analgesics.At the time of discharge his pain was down to 5/10. I have spoken with NP Hollis at the [facility] and she has agreed to allow patient to pick up his prescription today as an exception.  . Pulmonary embolus ([place]): Pt was found to have a small embolus  which is sub-acute. He is already on injectable anticoagulant (failed Xarelto). He was evaluated for possible DVT in the le's and in the Jugular vein both of which were found to be negative. He was seen in consultation by Pulmonology who agreed that no change in therapy was warranted. An anti X-a level was found to be low at 0.42 and his dose Of Lovenox was increased from 110 mg to 120 mg daily.  . Chronic Hypoxic Respiratory Failure: Secondary to Cor- pulmonale associated with Pulmonary Hypertension and recurrent PE's. He ision chronic Oxygen 4 L/mins around the clock. Teaching was done both by Nursing and [facility] DME representative prior to discharge.  Disposition and Follow-up:  Pt is discharged home in good condition and is to follow up on 12/05/2014 at appointment previously scheduled with NP Cammie Sickle.     Discharge Instructions    Activity as tolerated - No restrictions    Complete by:  As directed      Diet general    Complete by:  As directed            DISCHARGE EXAM:  General: Alert, awake, oriented x3, in no distress.  Vital Signs: BP 121/82, HR 63, T 98.5 F (36.9 C), temperature source Oral, RR 18, height 6' (1.829 m), weight 165 lb (74.844 kg), SpO2 88 %. HEENT: [place]/AT PEERL, EOMI, anicteric but  muddy sclera. Neck: Trachea midline, no masses, no thyromegal,y no JVD, no carotid bruit OROPHARYNX: Moist, No exudate/ erythema/lesions. Geographic tongue. Heart: Regular rate and rhythm, without murmurs, rubs, gallops or S3. PMI non-displaced. Exam reveals no decreased pulses. Pulmonary/Chest: Normal effort. Breath sounds normal. No. Apnea. Clear to auscultation,no stridor,  no wheezing and no rhonchi noted. No respiratory distress and no tenderness noted. Abdomen: Soft, nontender, nondistended, normal bowel sounds, no masses no hepatosplenomegaly noted. No fluid wave and no ascites. There is no guarding or rebound. Neuro: Alert and oriented to person, place and time. Normal motor skills, Displays no atrophy or tremors and exhibits normal muscle tone.  No focal neurological deficits noted cranial nerves II through XII grossly intact. No sensory deficit noted. Strength at baseline in bilateral upper and lower extremities. Gait normal. Musculoskeletal: No warm swelling or erythema around joints, no spinal tenderness noted. Psychiatric: Patient alert and oriented x3, good insight and cognition, good recent to remote recall. Mood, memory, affect and judgement normal Skin: Skin is warm and dry. No bruising, no ecchymosis and no rash noted. Pt is not diaphoretic. No erythema. No pallor        Recent Labs  11/30/14 1356 12/02/14 0840  NA  --  137  K  --  4.2  CL  --  111  CO2  --  22  GLUCOSE  --  98  BUN  --  9  CREATININE  --  0.63  CALCIUM  --  8.3*  MG 1.5*  --    No results for input(s): AST, ALT, ALKPHOS, BILITOT, PROT, ALBUMIN in the last 72 hours. No results for input(s): LIPASE, AMYLASE in the last 72 hours.  Recent Labs  12/01/14 0333 12/02/14 0840  WBC 16.5* 14.8*  NEUTROABS 9.8* 9.4*  HGB 7.9* 6.9*  HCT 22.8* 19.4*  MCV 90.5 90.7  PLT 413* 366     Total time spent including face to face and decision making was greater than 30  minutes  Signed: Aidian Salomon A. 12/03/2014, 11:15 AM

## 2014-12-03 NOTE — Care Management Note (Signed)
Case Management Note  Patient Details  Name: GRANVIL DJORDJEVIC MRN: [id number] Date of Birth: 17-Feb-1980  Subjective/Objective:  [facility] dme rep Roanna Raider already spoken to patient about going forward a plan for teaching on home 02 portable tanks, &concentrators. [facility] rep Kristen aware of d/c & HHC orders. Patient voiced understanding of [facility] plan for ongoing teaching w/home 02 tanks,& concentrators.Patient has travel tank to be brought from home @ d/c.MD updated.                 Action/Plan:d/c home  w/HHC.   Expected Discharge Date:                  Expected Discharge Plan:  [place]  In-House Referral:     Discharge planning Services  CM Consult  Post Acute Care Choice:  Durable Medical Equipment [facility]) Choice offered to:  Patient  DME Arranged:  Oxygen DME Agency:  [place]:  RN, Respirator Therapy [place] Agency:  [place]  Status of Service:  Completed, signed off  Medicare Important Message Given:    Date Medicare IM Given:    Medicare IM give by:    Date Additional Medicare IM Given:    Additional Medicare Important Message give by:     If discussed at [place] of Stay Meetings, dates discussed:    Additional Comments:  Dessa Phi, RN 12/03/2014, 12:50 PM

## 2014-12-05 ENCOUNTER — Encounter: Payer: Self-pay | Admitting: Family Medicine

## 2014-12-05 ENCOUNTER — Ambulatory Visit (INDEPENDENT_AMBULATORY_CARE_PROVIDER_SITE_OTHER): Payer: Medicare Other | Admitting: Family Medicine

## 2014-12-05 VITALS — BP 110/73 | HR 90 | Temp 98.4°F | Resp 18 | Ht 72.0 in | Wt 159.0 lb

## 2014-12-05 DIAGNOSIS — Z86711 Personal history of pulmonary embolism: Secondary | ICD-10-CM | POA: Diagnosis not present

## 2014-12-05 DIAGNOSIS — Z7901 Long term (current) use of anticoagulants: Secondary | ICD-10-CM

## 2014-12-05 DIAGNOSIS — I272 Other secondary pulmonary hypertension: Secondary | ICD-10-CM

## 2014-12-05 DIAGNOSIS — D571 Sickle-cell disease without crisis: Secondary | ICD-10-CM

## 2014-12-05 NOTE — Progress Notes (Signed)
Subjective:    Patient ID: [name], male    DOB: 23-Jun-1980, 34 y.o.   MRN: [id number]  HPI  [name], a 34 year old male with a history of sickle cell anemia, HbSS presents for a post hospital follow-up. [name] was admitted for syncope and hypoxia. He was also restarted on Metoprolol extended relief. Patient reports that he was issued oxygen on 12/04/2014 by [facility]. He states that he will only use oxygen at home. He is not carrying an oxygen tank around. He maintains that he is using Lovenox consistently due to history of PE.    [name] reports that he has 6/10 pain primarily to lower extremities that is consistent with sickle cell anemia.  Patient has been having increased pain over the past several weeks. [name] hydroxyurea restarted on previous office visit.  Patient is currently not followed by hematologist. He reports that he last had Dilaudid 4 mg around 7 am with moderate relief.  [name] denies headache, shortness of breath, chest pains, nausea, vomiting, and diarrhea.  Immunization History  Administered Date(s) Administered  . Influenza Split 01/15/2012  . Influenza,inj,Quad PF,36+ Mos 11/14/2012, 10/28/2013, 10/27/2014  . Pneumococcal Conjugate-13 07/07/2014  . Pneumococcal Polysaccharide-23 01/15/2012  . Tdap 11/21/2012   Past Medical History  Diagnosis Date  . Sickle cell anemia (HCC)   . Blood transfusion   . Acute embolism and thrombosis of right internal jugular vein ([place])   . Hypokalemia   . Mood disorder ([place])   . History of pulmonary embolus (PE)   . Avascular necrosis (HCC)     Right Hip  . Leukocytosis     Chronic  . Thrombocytosis (HCC)     Chronic  . Hypertension   . History of Clostridium difficile infection   . Uses marijuana   . Chronic anticoagulation   . Functional asplenia   . Former smoker   . Second hand tobacco smoke exposure   . Alcohol consumption of one to four drinks per day   .  Noncompliance with medication regimen   . Sickle-cell crisis with associated acute chest syndrome ([name]) 05/13/2013  . Acute chest syndrome ([place]) 06/18/2013  . Demand ischemia ([place]) 01/02/2014  . Pulmonary hypertension ([place])    Social History   Social History  . Marital Status: Single    Spouse Name: N/A  . Number of Children: 0  . Years of Education: 13   Occupational History  . 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 Smoking status: Former Smoker -- 13 years  . Smokeless tobacco: Never Used  . Alcohol Use: No  . Drug Use: 2.00 per week    Special: Marijuana     Comment: Marijuana weekly  . Sexual Activity:    Partners: Female    Museum/gallery curator: None     Comment: month ago   Other Topics Concern  . Not on file   Social History Narrative   Lives in an apartment.  Single.  Lives alone but has a girlfriend that helps care for him.  Does not use any assist devices.        [name]:  [phone] Mom, emergency contact      [facility]:   Patient continuing to live in her apartment in town alone. 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  No Known Allergies Review of Systems  Constitutional:  Negative for fever, fatigue and unexpected weight change.  HENT: Negative.   Eyes: Negative.   Respiratory: Negative.   Gastrointestinal: Negative.  Negative for nausea and diarrhea.  Endocrine: Negative.  Negative for polydipsia, polyphagia and polyuria.  Genitourinary: Negative.   Musculoskeletal: Positive for myalgias (lower extremities).  Skin: Negative.   Allergic/Immunologic: Negative.   Neurological: Negative.  Negative for dizziness.  Hematological: Negative.   Psychiatric/Behavioral: Negative.       Objective:   Physical Exam  Constitutional: He is oriented to person, place, and time. He appears well-nourished.  HENT:  Head: Normocephalic and atraumatic.  Right Ear: External ear normal.  Left  Ear: External ear normal.  Mouth/Throat: Oropharynx is clear and moist.  Eyes: Conjunctivae and EOM are normal. Pupils are equal, round, and reactive to light.  Neck: Normal range of motion. Neck supple.  Cardiovascular: Intact distal pulses and normal pulses.  An irregularly irregular rhythm present.  Pulmonary/Chest: Effort normal and breath sounds normal.  Abdominal: Soft. Bowel sounds are normal.  Musculoskeletal: Normal range of motion.  Neurological: He is alert and oriented to person, place, and time. He has normal reflexes.  Skin: Skin is warm and dry.  Patient appears ashen. Normal capillary refill.   Psychiatric: He has a normal mood and affect. His behavior is normal. Judgment and thought content normal.      BP 110/73 mmHg  Pulse 90  Temp(Src) 98.4 F (36.9 C) (Oral)  Resp 18  Ht 6' (1.829 m)  Wt 159 lb (72.122 kg)  BMI 21.56 kg/m2  SpO2 95% Assessment & Plan:  1. Hb-SS disease with crisis Patient currently on 1500 mg of hydrea.  Reviewed most recent CBC w/ differential. ANC >2, platelet count > 80K, and hemoglobin is >6.  Reviewed recent CBC w/differential and reticulocyte. Will not increase hydrea at this point. Patient did not follow up with hematologist as scheduled. We discussed the need for good hydration, monitoring of hydration status, avoidance of heat, cold, stress, and infection triggers. We discussed the risks and benefits of Hydrea, including bone marrow suppression, the possibility of GI upset, skin ulcers, hair thinning, and teratogenicity. The patient was reminded of the need to seek medical attention of any symptoms of bleeding, anemia, or infection. Continue folic acid 1 mg daily to prevent aplastic bone marrow crises.    Pulmonary evaluation -  Patient was discharged with 3 liters of oxygen during previous hospital visit. He states that he will not wear oxygen in public. Patient denies severe recurrent wheezes, shortness of breath with exercise, or persistent  cough. Oxygen saturation is 95% without oxygen.   Cardiac - Abnormal EKG and echocardiogram. Patient has a history of pulmonary hypertension. He has been referred to cardiology on several visits.    Acute and chronic painful episodes - We agreed on continuing MS contin 30 mg every 12 hours.  Will also continue Dilaudid 4 mg every 4 hours as needed for moderate to severe pain #90. Patient is currently at 156 milli equivalents per day.  We discussed that pt is to receive her Schedule II prescriptions only from [place]. Pt is also aware that the prescription history is available to [place] online through the [facility] CSRS. Controlled substance agreement signed on 07/08/2014. We reminded Mr. Overbeck  that all patients receiving Schedule II narcotics must be seen for follow within one month of prescription being requested. We reviewed the terms of our pain agreement, including the need to keep medicines in a safe locked location away  from children or pets, and the need to report excess sedation or constipation, measures to avoid constipation, and policies related to early refills and stolen prescriptions. According to the [place] Chronic Pain Initiative program, we have reviewed details related to analgesia, adverse effects, aberrant behaviors. Reviewed [place] Substance Reporting system prior to reorder, no inconsistencies noted.   2. Hx of pulmonary embolus Patient was started on 120 units of Lovenox daily during hospital stay. He maintains that he has been using Lovenox consistently. Discussed side effects; specifically signs of bleeding. He expressed understanding.   3. Chronic anticoagulation Refer to #2  4. Pulmonary HTN ([place]) Patient to continue Metoprolol 50 mg XL as scheduled.   RTC: 1 month for medication management The patient was given clear instructions to go to ER or return to medical center if symptoms do not improve, worsen or new problems develop. The patient verbalized understanding. Will notify patient with  laboratory results. Dorena Dew, FNP

## 2014-12-06 ENCOUNTER — Inpatient Hospital Stay (HOSPITAL_COMMUNITY)
Admission: EM | Admit: 2014-12-06 | Discharge: 2014-12-11 | DRG: 812 | Disposition: A | Discharge status: Home Health | Payer: Medicare Other | Attending: Internal Medicine | Admitting: Internal Medicine

## 2014-12-06 ENCOUNTER — Encounter (HOSPITAL_COMMUNITY): Payer: Self-pay | Admitting: Emergency Medicine

## 2014-12-06 ENCOUNTER — Emergency Department (HOSPITAL_COMMUNITY): Payer: Medicare Other

## 2014-12-06 DIAGNOSIS — Z9119 Patient's noncompliance with other medical treatment and regimen: Secondary | ICD-10-CM | POA: Diagnosis not present

## 2014-12-06 DIAGNOSIS — D72829 Elevated white blood cell count, unspecified: Secondary | ICD-10-CM | POA: Diagnosis present

## 2014-12-06 DIAGNOSIS — Z7982 Long term (current) use of aspirin: Secondary | ICD-10-CM

## 2014-12-06 DIAGNOSIS — Z7901 Long term (current) use of anticoagulants: Secondary | ICD-10-CM | POA: Diagnosis not present

## 2014-12-06 DIAGNOSIS — I10 Essential (primary) hypertension: Secondary | ICD-10-CM | POA: Diagnosis not present

## 2014-12-06 DIAGNOSIS — Z79891 Long term (current) use of opiate analgesic: Secondary | ICD-10-CM | POA: Diagnosis not present

## 2014-12-06 DIAGNOSIS — I5022 Chronic systolic (congestive) heart failure: Secondary | ICD-10-CM | POA: Diagnosis present

## 2014-12-06 DIAGNOSIS — R42 Dizziness and giddiness: Secondary | ICD-10-CM | POA: Diagnosis present

## 2014-12-06 DIAGNOSIS — Z96641 Presence of right artificial hip joint: Secondary | ICD-10-CM | POA: Diagnosis present

## 2014-12-06 DIAGNOSIS — Z515 Encounter for palliative care: Secondary | ICD-10-CM | POA: Diagnosis not present

## 2014-12-06 DIAGNOSIS — R55 Syncope and collapse: Secondary | ICD-10-CM | POA: Diagnosis present

## 2014-12-06 DIAGNOSIS — Z833 Family history of diabetes mellitus: Secondary | ICD-10-CM | POA: Diagnosis not present

## 2014-12-06 DIAGNOSIS — Z86718 Personal history of other venous thrombosis and embolism: Secondary | ICD-10-CM

## 2014-12-06 DIAGNOSIS — Z79899 Other long term (current) drug therapy: Secondary | ICD-10-CM | POA: Diagnosis not present

## 2014-12-06 DIAGNOSIS — I110 Hypertensive heart disease with heart failure: Secondary | ICD-10-CM | POA: Diagnosis present

## 2014-12-06 DIAGNOSIS — Z832 Family history of diseases of the blood and blood-forming organs and certain disorders involving the immune mechanism: Secondary | ICD-10-CM | POA: Diagnosis not present

## 2014-12-06 DIAGNOSIS — F39 Unspecified mood [affective] disorder: Secondary | ICD-10-CM | POA: Diagnosis present

## 2014-12-06 DIAGNOSIS — Z86711 Personal history of pulmonary embolism: Secondary | ICD-10-CM | POA: Diagnosis present

## 2014-12-06 DIAGNOSIS — J9611 Chronic respiratory failure with hypoxia: Secondary | ICD-10-CM | POA: Diagnosis present

## 2014-12-06 DIAGNOSIS — Z87891 Personal history of nicotine dependence: Secondary | ICD-10-CM | POA: Diagnosis not present

## 2014-12-06 DIAGNOSIS — D5700 Hb-SS disease with crisis, unspecified: Secondary | ICD-10-CM | POA: Diagnosis not present

## 2014-12-06 DIAGNOSIS — R079 Chest pain, unspecified: Secondary | ICD-10-CM | POA: Diagnosis not present

## 2014-12-06 DIAGNOSIS — Z9981 Dependence on supplemental oxygen: Secondary | ICD-10-CM

## 2014-12-06 DIAGNOSIS — Z7189 Other specified counseling: Secondary | ICD-10-CM | POA: Diagnosis not present

## 2014-12-06 DIAGNOSIS — D638 Anemia in other chronic diseases classified elsewhere: Secondary | ICD-10-CM | POA: Diagnosis present

## 2014-12-06 DIAGNOSIS — Z818 Family history of other mental and behavioral disorders: Secondary | ICD-10-CM | POA: Diagnosis not present

## 2014-12-06 DIAGNOSIS — R52 Pain, unspecified: Secondary | ICD-10-CM | POA: Diagnosis not present

## 2014-12-06 DIAGNOSIS — I272 Other secondary pulmonary hypertension: Secondary | ICD-10-CM | POA: Diagnosis not present

## 2014-12-06 DIAGNOSIS — I2781 Cor pulmonale (chronic): Secondary | ICD-10-CM | POA: Diagnosis present

## 2014-12-06 DIAGNOSIS — M79606 Pain in leg, unspecified: Secondary | ICD-10-CM | POA: Diagnosis not present

## 2014-12-06 LAB — I-STAT CG4 LACTIC ACID, ED
LACTIC ACID, VENOUS: 0.82 mmol/L (ref 0.5–2.0)
LACTIC ACID, VENOUS: 0.96 mmol/L (ref 0.5–2.0)

## 2014-12-06 LAB — COMPREHENSIVE METABOLIC PANEL
ALT: 26 U/L (ref 17–63)
AST: 55 U/L — AB (ref 15–41)
Albumin: 4 g/dL (ref 3.5–5.0)
Alkaline Phosphatase: 87 U/L (ref 38–126)
Anion gap: 8 (ref 5–15)
BUN: 13 mg/dL (ref 6–20)
CALCIUM: 9 mg/dL (ref 8.9–10.3)
CHLORIDE: 108 mmol/L (ref 101–111)
CO2: 23 mmol/L (ref 22–32)
CREATININE: 0.93 mg/dL (ref 0.61–1.24)
Glucose, Bld: 123 mg/dL — ABNORMAL HIGH (ref 65–99)
Potassium: 3.7 mmol/L (ref 3.5–5.1)
Sodium: 139 mmol/L (ref 135–145)
Total Bilirubin: 5.7 mg/dL — ABNORMAL HIGH (ref 0.3–1.2)
Total Protein: 7.4 g/dL (ref 6.5–8.1)

## 2014-12-06 LAB — APTT: APTT: 43 s — AB (ref 24–37)

## 2014-12-06 LAB — RETICULOCYTES
RBC.: 2.52 MIL/uL — ABNORMAL LOW (ref 4.22–5.81)
Retic Count, Absolute: 461.2 10*3/uL — ABNORMAL HIGH (ref 19.0–186.0)
Retic Ct Pct: 18.3 % — ABNORMAL HIGH (ref 0.4–3.1)

## 2014-12-06 LAB — URINALYSIS, ROUTINE W REFLEX MICROSCOPIC
BILIRUBIN URINE: NEGATIVE
GLUCOSE, UA: NEGATIVE mg/dL
Ketones, ur: NEGATIVE mg/dL
Leukocytes, UA: NEGATIVE
Nitrite: NEGATIVE
PROTEIN: NEGATIVE mg/dL
SPECIFIC GRAVITY, URINE: 1.009 (ref 1.005–1.030)
UROBILINOGEN UA: 1 mg/dL (ref 0.0–1.0)
pH: 6.5 (ref 5.0–8.0)

## 2014-12-06 LAB — CBC WITH DIFFERENTIAL/PLATELET
BASOS ABS: 0 10*3/uL (ref 0.0–0.1)
Basophils Relative: 0 %
EOS PCT: 1 %
Eosinophils Absolute: 0.2 10*3/uL (ref 0.0–0.7)
HCT: 22.7 % — ABNORMAL LOW (ref 39.0–52.0)
Hemoglobin: 7.9 g/dL — ABNORMAL LOW (ref 13.0–17.0)
LYMPHS ABS: 3.1 10*3/uL (ref 0.7–4.0)
Lymphocytes Relative: 14 %
MCH: 31.3 pg (ref 26.0–34.0)
MCHC: 34.8 g/dL (ref 30.0–36.0)
MCV: 90.1 fL (ref 78.0–100.0)
MONO ABS: 2.9 10*3/uL — AB (ref 0.1–1.0)
Monocytes Relative: 13 %
NEUTROS ABS: 16 10*3/uL — AB (ref 1.7–7.7)
NEUTROS PCT: 72 %
PLATELETS: 459 10*3/uL — AB (ref 150–400)
RBC: 2.52 MIL/uL — ABNORMAL LOW (ref 4.22–5.81)
RDW: 19.5 % — AB (ref 11.5–15.5)
WBC: 22.2 10*3/uL — AB (ref 4.0–10.5)
nRBC: 2 /100 WBC — ABNORMAL HIGH

## 2014-12-06 LAB — URINE MICROSCOPIC-ADD ON

## 2014-12-06 LAB — LACTATE DEHYDROGENASE: LDH: 576 U/L — ABNORMAL HIGH (ref 98–192)

## 2014-12-06 LAB — GLUCOSE, CAPILLARY: GLUCOSE-CAPILLARY: 115 mg/dL — AB (ref 65–99)

## 2014-12-06 LAB — MAGNESIUM: Magnesium: 1.7 mg/dL (ref 1.7–2.4)

## 2014-12-06 MED ORDER — LISINOPRIL 10 MG PO TABS
10.0000 mg | ORAL_TABLET | Freq: Every day | ORAL | Status: DC
  Administered 2014-12-06 – 2014-12-11 (×6): 10 mg via ORAL
  Filled 2014-12-06 (×6): qty 1

## 2014-12-06 MED ORDER — POTASSIUM CHLORIDE CRYS ER 20 MEQ PO TBCR
20.0000 meq | EXTENDED_RELEASE_TABLET | Freq: Every morning | ORAL | Status: DC
  Administered 2014-12-06 – 2014-12-11 (×6): 20 meq via ORAL
  Filled 2014-12-06 (×6): qty 1

## 2014-12-06 MED ORDER — DIPHENHYDRAMINE HCL 25 MG PO CAPS
25.0000 mg | ORAL_CAPSULE | Freq: Once | ORAL | Status: AC
  Administered 2014-12-06: 25 mg via ORAL
  Filled 2014-12-06: qty 1

## 2014-12-06 MED ORDER — HYDROMORPHONE HCL 1 MG/ML IJ SOLN
1.0000 mg | Freq: Once | INTRAMUSCULAR | Status: AC
  Administered 2014-12-06: 1 mg via INTRAVENOUS
  Filled 2014-12-06: qty 1

## 2014-12-06 MED ORDER — DIPHENHYDRAMINE HCL 12.5 MG/5ML PO ELIX: 12.5000 mg | ORAL_SOLUTION | Freq: Four times a day (QID) | ORAL | Status: DC | PRN

## 2014-12-06 MED ORDER — HYDROMORPHONE HCL 2 MG/ML IJ SOLN
2.0000 mg | Freq: Once | INTRAMUSCULAR | Status: AC
  Administered 2014-12-06: 2 mg via INTRAVENOUS
  Filled 2014-12-06: qty 1

## 2014-12-06 MED ORDER — SODIUM CHLORIDE 0.9 % IJ SOLN: 9.0000 mL | INTRAMUSCULAR | Status: DC | PRN

## 2014-12-06 MED ORDER — METOPROLOL SUCCINATE ER 50 MG PO TB24
50.0000 mg | ORAL_TABLET | Freq: Every day | ORAL | Status: DC
  Administered 2014-12-06 – 2014-12-11 (×6): 50 mg via ORAL
  Filled 2014-12-06 (×6): qty 1

## 2014-12-06 MED ORDER — DIPHENHYDRAMINE HCL 25 MG PO CAPS
25.0000 mg | ORAL_CAPSULE | Freq: Four times a day (QID) | ORAL | Status: DC | PRN
  Administered 2014-12-07 – 2014-12-10 (×5): 25 mg via ORAL
  Filled 2014-12-06 (×5): qty 1

## 2014-12-06 MED ORDER — ONDANSETRON HCL 4 MG/2ML IJ SOLN: 4.0000 mg | Freq: Four times a day (QID) | INTRAMUSCULAR | Status: DC | PRN

## 2014-12-06 MED ORDER — NALOXONE HCL 0.4 MG/ML IJ SOLN
0.4000 mg | INTRAMUSCULAR | Status: DC | PRN
Start: 2014-12-06 — End: 2014-12-11

## 2014-12-06 MED ORDER — ONDANSETRON HCL 4 MG/2ML IJ SOLN
4.0000 mg | Freq: Once | INTRAMUSCULAR | Status: AC
  Administered 2014-12-06: 4 mg via INTRAVENOUS
  Filled 2014-12-06: qty 2

## 2014-12-06 MED ORDER — HYDROMORPHONE 1 MG/ML IV SOLN
INTRAVENOUS | Status: DC
  Administered 2014-12-06: 3 mg via INTRAVENOUS
  Administered 2014-12-06: 2.4 mg via INTRAVENOUS
  Administered 2014-12-06: 3 mg via INTRAVENOUS
  Administered 2014-12-06: 11:00:00 via INTRAVENOUS
  Administered 2014-12-07: 10.2 mg via INTRAVENOUS
  Administered 2014-12-07: 3.6 mg via INTRAVENOUS
  Administered 2014-12-07: 03:00:00 via INTRAVENOUS
  Filled 2014-12-06 (×2): qty 25

## 2014-12-06 MED ORDER — SENNOSIDES-DOCUSATE SODIUM 8.6-50 MG PO TABS
1.0000 | ORAL_TABLET | Freq: Two times a day (BID) | ORAL | Status: DC
  Administered 2014-12-06 – 2014-12-11 (×11): 1 via ORAL
  Filled 2014-12-06 (×11): qty 1

## 2014-12-06 MED ORDER — ASPIRIN 81 MG PO CHEW
81.0000 mg | CHEWABLE_TABLET | Freq: Every day | ORAL | Status: DC
  Administered 2014-12-06 – 2014-12-11 (×6): 81 mg via ORAL
  Filled 2014-12-06 (×6): qty 1

## 2014-12-06 MED ORDER — HYDROXYUREA 500 MG PO CAPS
500.0000 mg | ORAL_CAPSULE | Freq: Every day | ORAL | Status: DC
  Administered 2014-12-06 – 2014-12-08 (×3): 500 mg via ORAL
  Filled 2014-12-06 (×3): qty 1

## 2014-12-06 MED ORDER — KETOROLAC TROMETHAMINE 30 MG/ML IJ SOLN
30.0000 mg | Freq: Once | INTRAMUSCULAR | Status: AC
Start: 2014-12-06 — End: 2014-12-06
  Administered 2014-12-06: 30 mg via INTRAVENOUS
  Filled 2014-12-06: qty 1

## 2014-12-06 MED ORDER — KETOROLAC TROMETHAMINE 30 MG/ML IJ SOLN
30.0000 mg | Freq: Four times a day (QID) | INTRAMUSCULAR | Status: AC
Start: 2014-12-06 — End: 2014-12-11
  Administered 2014-12-06 – 2014-12-11 (×20): 30 mg via INTRAVENOUS
  Filled 2014-12-06 (×20): qty 1

## 2014-12-06 MED ORDER — SODIUM CHLORIDE 0.9 % IV BOLUS (SEPSIS)
500.0000 mL | Freq: Once | INTRAVENOUS | Status: AC
  Administered 2014-12-06: 500 mL via INTRAVENOUS

## 2014-12-06 MED ORDER — DEXTROSE-NACL 5-0.45 % IV SOLN
INTRAVENOUS | Status: DC
  Administered 2014-12-06 – 2014-12-11 (×8): via INTRAVENOUS

## 2014-12-06 MED ORDER — ZOLPIDEM TARTRATE 10 MG PO TABS
10.0000 mg | ORAL_TABLET | Freq: Every evening | ORAL | Status: DC | PRN
  Administered 2014-12-06 – 2014-12-10 (×4): 10 mg via ORAL
  Filled 2014-12-06 (×4): qty 1

## 2014-12-06 MED ORDER — VITAMIN D 1000 UNITS PO TABS
2000.0000 [IU] | ORAL_TABLET | Freq: Every day | ORAL | Status: DC
  Administered 2014-12-06 – 2014-12-11 (×6): 2000 [IU] via ORAL
  Filled 2014-12-06 (×6): qty 2

## 2014-12-06 MED ORDER — FOLIC ACID 1 MG PO TABS
1.0000 mg | ORAL_TABLET | Freq: Every morning | ORAL | Status: DC
  Administered 2014-12-06 – 2014-12-11 (×6): 1 mg via ORAL
  Filled 2014-12-06 (×6): qty 1

## 2014-12-06 MED ORDER — POLYETHYLENE GLYCOL 3350 17 G PO PACK: 17.0000 g | PACK | Freq: Every day | ORAL | Status: DC | PRN

## 2014-12-06 MED ORDER — DIPHENHYDRAMINE HCL 50 MG/ML IJ SOLN
12.5000 mg | Freq: Four times a day (QID) | INTRAMUSCULAR | Status: DC | PRN
  Administered 2014-12-06: 12.5 mg via INTRAVENOUS
  Filled 2014-12-06: qty 1

## 2014-12-06 MED ORDER — MORPHINE SULFATE ER 30 MG PO TBCR
30.0000 mg | EXTENDED_RELEASE_TABLET | Freq: Two times a day (BID) | ORAL | Status: DC
  Administered 2014-12-06 – 2014-12-11 (×11): 30 mg via ORAL
  Filled 2014-12-06 (×11): qty 1

## 2014-12-06 MED ORDER — ENOXAPARIN SODIUM 120 MG/0.8ML ~~LOC~~ SOLN
120.0000 mg | SUBCUTANEOUS | Status: DC
  Administered 2014-12-06 – 2014-12-10 (×5): 120 mg via SUBCUTANEOUS
  Filled 2014-12-06 (×7): qty 0.8

## 2014-12-06 NOTE — H&P (Signed)
[name] is an 34 y.o. male.   Chief Complaint: Pain in legs and back HPI: Patient admitted with 3 days of pain not relieved by his home medications. Patient also felt dizzy while at work. He was discharged about a week ago and since discharge he has apparently had no appetite. He has not been eating or drinking adequately. He went to work today and felt dizzy almost passed out. His blood sugar was on the low side. Since admission however he has gained his appetite. His pain is at 10 out of 10 when arrival at the ER. He received IV Dilaudid in the ER of to 8 mg but no relief. He is therefore being admitted for further management. Denied any shortness of breath, no cough no nausea vomiting or diarrhea. Pain is located in his back and lower legs. Also chest wall.  Past Medical History  Diagnosis Date  . Sickle cell anemia (HCC)   . Blood transfusion   . Acute embolism and thrombosis of right internal jugular vein ([place])   . Hypokalemia   . Mood disorder ([place])   . History of pulmonary embolus (PE)   . Avascular necrosis (HCC)     Right Hip  . Leukocytosis     Chronic  . Thrombocytosis (HCC)     Chronic  . Hypertension   . History of Clostridium difficile infection   . Uses marijuana   . Chronic anticoagulation   . Functional asplenia   . Former smoker   . Second hand tobacco smoke exposure   . Alcohol consumption of one to four drinks per day   . Noncompliance with medication regimen   . Sickle-cell crisis with associated acute chest syndrome ([place]) 05/13/2013  . Acute chest syndrome ([place]) 06/18/2013  . Demand ischemia ([place]) 01/02/2014  . Pulmonary hypertension [facility])     Past Surgical History  Procedure Laterality Date  . Right hip replacement      08/2006  . Cholecystectomy      01/2008  . Porta cath placement    . Porta cath removal    . Umbilical hernia repair      01/2008  . Excision of left periauricular cyst      10/2009  . Excision of right ear lobe cyst with primary  closur      11/2007  . Portacath placement  01/05/2012    Procedure: INSERTION PORT-A-CATH;  Surgeon: Odis Hollingshead, MD;  Location: [place];  Service: General;  Laterality: N/A;  ultrasound guiced port a cath insertion with fluoroscopy    Family History  Problem Relation Age of Onset  . Sickle cell trait Mother   . Depression Mother   . Diabetes Mother   . Sickle cell trait Father   . Sickle cell trait Brother    Social History:  reports that he has quit smoking. He has never used smokeless tobacco. He reports that he uses illicit drugs (Marijuana) about twice per week. He reports that he does not drink alcohol.  Allergies: No Known Allergies   (Not in a hospital admission)  Results for orders placed or performed during the hospital encounter of 12/06/14 (from the past 48 hour(s))  CBC WITH DIFFERENTIAL     Status: Abnormal   Collection Time: 12/06/14  3:50 AM  Result Value Ref Range   WBC 22.2 (H) 4.0 - 10.5 K/uL   RBC 2.52 (L) 4.22 - 5.81 MIL/uL   Hemoglobin 7.9 (L) 13.0 - 17.0 g/dL   HCT  22.7 (L) 39.0 - 52.0 %   MCV 90.1 78.0 - 100.0 fL   MCH 31.3 26.0 - 34.0 pg   MCHC 34.8 30.0 - 36.0 g/dL   RDW 19.5 (H) 11.5 - 15.5 %   Platelets 459 (H) 150 - 400 K/uL   Neutrophils Relative % 72 %   Lymphocytes Relative 14 %   Monocytes Relative 13 %   Eosinophils Relative 1 %   Basophils Relative 0 %   nRBC 2 (H) 0 /100 WBC   Neutro Abs 16.0 (H) 1.7 - 7.7 K/uL   Lymphs Abs 3.1 0.7 - 4.0 K/uL   Monocytes Absolute 2.9 (H) 0.1 - 1.0 K/uL   Eosinophils Absolute 0.2 0.0 - 0.7 K/uL   Basophils Absolute 0.0 0.0 - 0.1 K/uL   RBC Morphology TARGET CELLS     Comment: SICKLE CELLS RARE NRBCs    WBC Morphology VACUOLATED NEUTROPHILS    Smear Review LARGE PLATELETS PRESENT   APTT     Status: Abnormal   Collection Time: 12/06/14  3:50 AM  Result Value Ref Range   aPTT 43 (H) 24 - 37 seconds    Comment:        IF BASELINE aPTT IS ELEVATED, SUGGEST PATIENT RISK ASSESSMENT BE USED TO  DETERMINE APPROPRIATE ANTICOAGULANT THERAPY.   Reticulocytes     Status: Abnormal   Collection Time: 12/06/14  3:50 AM  Result Value Ref Range   Retic Ct Pct 18.3 (H) 0.4 - 3.1 %    Comment: RESULTS CONFIRMED BY MANUAL DILUTION   RBC. 2.52 (L) 4.22 - 5.81 MIL/uL   Retic Count, Manual 461.2 (H) 19.0 - 186.0 K/uL  Comprehensive metabolic panel     Status: Abnormal   Collection Time: 12/06/14  3:50 AM  Result Value Ref Range   Sodium 139 135 - 145 mmol/L   Potassium 3.7 3.5 - 5.1 mmol/L   Chloride 108 101 - 111 mmol/L   CO2 23 22 - 32 mmol/L   Glucose, Bld 123 (H) 65 - 99 mg/dL   BUN 13 6 - 20 mg/dL   Creatinine, Ser 0.93 0.61 - 1.24 mg/dL   Calcium 9.0 8.9 - 10.3 mg/dL   Total Protein 7.4 6.5 - 8.1 g/dL   Albumin 4.0 3.5 - 5.0 g/dL   AST 55 (H) 15 - 41 U/L   ALT 26 17 - 63 U/L   Alkaline Phosphatase 87 38 - 126 U/L   Total Bilirubin 5.7 (H) 0.3 - 1.2 mg/dL   GFR calc non Af Amer >60 >60 mL/min   GFR calc Af Amer >60 >60 mL/min    Comment: (NOTE) The eGFR has been calculated using the CKD EPI equation. This calculation has not been validated in all clinical situations. eGFR's persistently <60 mL/min signify possible Chronic Kidney Disease.    Anion gap 8 5 - 15  I-Stat CG4 Lactic Acid, ED (not at [facility])     Status: None   Collection Time: 12/06/14  4:00 AM  Result Value Ref Range   Lactic Acid, Venous 0.82 0.5 - 2.0 mmol/L   *Note: Due to a large number of results and/or encounters for the requested time period, some results have not been displayed. A complete set of results can be found in Results Review.   Dg Chest 2 View  12/06/2014  CLINICAL DATA:  Sickle cell crisis, 3 hours duration. Severe chest pain. EXAM: CHEST  2 VIEW COMPARISON:  11/26/2014 FINDINGS: There is a left subclavian Port-A-Cath with tip  in the low SVC. There is unchanged moderate cardiomegaly. Mild central vascular prominence is unchanged. No pleural effusions. No focal airspace consolidation. No  pneumothorax. IMPRESSION: Unchanged cardiomegaly. No confluent airspace consolidation or pleural effusion. Electronically Signed   By: Andreas Newport M.D.   On: 12/06/2014 04:43    Review of Systems  Constitutional: Negative.   Eyes: Negative.   Respiratory: Negative.   Cardiovascular: Negative.   Gastrointestinal: Negative.   Genitourinary: Negative.   Musculoskeletal: Positive for myalgias, back pain, joint pain and neck pain.  Skin: Negative.   Neurological: Negative.   Endo/Heme/Allergies: Negative.   Psychiatric/Behavioral: Negative.     Blood pressure 108/52, pulse 70, temperature 98.5 F (36.9 C), temperature source Oral, resp. rate 18, SpO2 97 %. Physical Exam  Constitutional: He is oriented to person, place, and time. He appears well-developed and well-nourished.  HENT:  Head: Normocephalic and atraumatic.  Right Ear: External ear normal.  Left Ear: External ear normal.  Eyes: Conjunctivae and EOM are normal. Pupils are equal, round, and reactive to light.  Neck: Normal range of motion. Neck supple.  Cardiovascular: Normal rate, regular rhythm and normal heart sounds.   Respiratory: Effort normal and breath sounds normal.  GI: Soft. Bowel sounds are normal.  Musculoskeletal: He exhibits tenderness.  Neurological: He is alert and oriented to person, place, and time. He has normal reflexes.  Skin: Skin is warm and dry.  Psychiatric: He has a normal mood and affect. His behavior is normal.     Assessment/Plan A 34 year old gentleman admitted with sickle cell painful crisis. #1 sickle cell painful crisis: Patient will be admitted and started on Dilaudid PCA with Toradol and IV fluids. Close monitoring of his H&H. And adjust medicine as needed. #2 sickle cell anemia: Hemoglobin close to baseline. I expect it to drop with hydration. Watch for possible hemolysis. #3 chronic anticoagulation: Patient on Lovenox at home. Continue Lovenox subcutaneously. #4 pulmonary  hypertension: Patient on home oxygen. He has not been compliant for the most part. Encourage patient to use his oxygen.  #5 hypertension: Continue home medications and monitor blood pressure closely.   [name],[name] 12/06/2014, 7:22 AM

## 2014-12-06 NOTE — ED Provider Notes (Signed)
CSN: [id number]     Arrival date & time 12/06/14  5456 History  By signing my name below, I, Hilda Lias, attest that this documentation has been prepared under the direction and in the presence of No att. providers found. Electronically Signed: Hilda Lias, ED Scribe. 12/06/2014. 3:54 AM.    Chief Complaint  Patient presents with  . Sickle Cell Pain Crisis     Patient is a 34 y.o. male presenting with sickle cell pain. The history is provided by the patient. No language interpreter was used.  Sickle Cell Pain Crisis Location:  L side, R side and back Severity:  Severe Onset quality:  Sudden Duration:  3 hours Similar to previous crisis episodes: yes   Timing:  Constant Chronicity:  Recurrent Sickle cell genotype:  SS History of pulmonary emboli: yes   Context: not stress   Relieved by:  Nothing Worsened by:  Nothing tried Ineffective treatments:  None tried Associated symptoms: no wheezing   Risk factors: not smoking    HPI Comments: ADOLPHUS HANF is a 34 y.o. male who presents to the Emergency Department complaining of constant, worsening, sharp and throbbing bilateral side pain and back pain due to a sickle cell pain crisis that has been present for three hours. Pt reports his pain woke him up in the middle of the night, and states that his pain is concurrent with his usual sickle cell pain. Pt states that nothing in particular caused the pain to occur. Pt reports taking Dilaudid at home for pain with little relief. Pt denies any other symptoms.   Past Medical History  Diagnosis Date  . Sickle cell anemia (HCC)   . Blood transfusion   . Acute embolism and thrombosis of right internal jugular vein ([place])   . Hypokalemia   . Mood disorder ([place])   . History of pulmonary embolus (PE)   . Avascular necrosis (HCC)     Right Hip  . Leukocytosis     Chronic  . Thrombocytosis (HCC)     Chronic  . Hypertension   . History of Clostridium difficile infection   . Uses  marijuana   . Chronic anticoagulation   . Functional asplenia   . Former smoker   . Second hand tobacco smoke exposure   . Alcohol consumption of one to four drinks per day   . Noncompliance with medication regimen   . Sickle-cell crisis with associated acute chest syndrome ([place]) 05/13/2013  . Acute chest syndrome ([place]) 06/18/2013  . Demand ischemia ([place]) 01/02/2014  . Pulmonary hypertension [facility])    Past Surgical History  Procedure Laterality Date  . Right hip replacement      08/2006  . Cholecystectomy      01/2008  . Porta cath placement    . Porta cath removal    . Umbilical hernia repair      01/2008  . Excision of left periauricular cyst      10/2009  . Excision of right ear lobe cyst with primary closur      11/2007  . Portacath placement  01/05/2012    Procedure: INSERTION PORT-A-CATH;  Surgeon: Odis Hollingshead, MD;  Location: [facility];  Service: General;  Laterality: N/A;  ultrasound guiced port a cath insertion with fluoroscopy   Family History  Problem Relation Age of Onset  . Sickle cell trait Mother   . Depression Mother   . Diabetes Mother   . Sickle cell trait Father   . Sickle cell trait  Brother    Social History  Substance Use Topics  . Smoking status: Former Smoker -- 13 years  . Smokeless tobacco: Never Used  . Alcohol Use: No    Review of Systems  Respiratory: Negative for wheezing.   Musculoskeletal: Positive for myalgias and back pain.  All other systems reviewed and are negative.     Allergies  Review of patient's allergies indicates no known allergies.  Home Medications   Prior to Admission medications   Medication Sig Start Date End Date Taking? Authorizing Provider  aspirin 81 MG chewable tablet Chew 1 tablet (81 mg total) by mouth daily. 07/23/14   [name], MD  Cholecalciferol (VITAMIN D) 2000 UNITS tablet Take 1 tablet (2,000 Units total) by mouth daily. 02/13/14   [name], MD  enoxaparin (LOVENOX) 120 MG/0.8ML  injection Inject 0.8 mLs (120 mg total) into the skin daily. 12/03/14   [name], MD  folic acid (FOLVITE) 1 MG tablet Take 1 tablet (1 mg total) by mouth every morning. 10/08/13   [name], MD  HYDROmorphone (DILAUDID) 4 MG tablet Take 1 tablet (4 mg total) by mouth every 4 (four) hours as needed for severe pain. 12/02/14   Tresa Garter, MD  hydroxyurea (HYDREA) 500 MG capsule Take 1 capsule (500 mg total) by mouth daily. May take with food to minimize GI side effects. 11/06/14   Dorena Dew, FNP  lisinopril (PRINIVIL,ZESTRIL) 10 MG tablet Take 1 tablet (10 mg total) by mouth daily. 01/22/14   [name], MD  metoprolol succinate (TOPROL-XL) 50 MG 24 hr tablet Take 1 tablet (50 mg total) by mouth daily. 11/28/14   [name], MD  morphine (MS CONTIN) 30 MG 12 hr tablet Take 1 tablet (30 mg total) by mouth every 12 (twelve) hours. 11/18/14   Tresa Garter, MD  potassium chloride SA (K-DUR,KLOR-CON) 20 MEQ tablet Take 1 tablet (20 mEq total) by mouth every morning. 06/09/14   [name], MD  zolpidem (AMBIEN) 10 MG tablet Take 1 tablet (10 mg total) by mouth at bedtime as needed for sleep. 10/23/14   Tresa Garter, MD   BP 115/81 mmHg  Pulse 79  Temp(Src) 98.5 F (36.9 C) (Oral)  Resp 20  SpO2 98% Physical Exam  Constitutional: He is oriented to person, place, and time. He appears well-developed and well-nourished. No distress.  HENT:  Head: Normocephalic and atraumatic.  Mouth/Throat: Oropharynx is clear and moist.  Moist mucous membranes  Eyes: EOM are normal. Pupils are equal, round, and reactive to light.  Neck: Normal range of motion. Neck supple.  Cardiovascular: Normal rate, regular rhythm and intact distal pulses.   Pulmonary/Chest: Effort normal and breath sounds normal. No respiratory distress. He has no wheezes. He has no rales. He exhibits no tenderness.  Abdominal: Soft. Bowel sounds are normal. He exhibits no  mass. There is no tenderness. There is no rebound and no guarding.  Musculoskeletal: Normal range of motion.  Neurological: He is alert and oriented to person, place, and time. He has normal reflexes.  Normal DTR  Skin: Skin is warm and dry.  Psychiatric: He has a normal mood and affect.    ED Course  Procedures (including critical care time)  DIAGNOSTIC STUDIES: Oxygen Saturation is 98% on room air, normal by my interpretation.    COORDINATION OF CARE: 3:45 AM Discussed treatment plan with pt at bedside and pt agreed to plan.   Labs Review Labs  Reviewed - No data to display  Imaging Review No results found. I have personally reviewed and evaluated these images and lab results as part of my medical decision-making.   EKG Interpretation None      MDM   Final diagnoses:  None    Results for orders placed or performed during the hospital encounter of 12/06/14  CBC WITH DIFFERENTIAL  Result Value Ref Range   WBC 22.2 (H) 4.0 - 10.5 K/uL   RBC 2.52 (L) 4.22 - 5.81 MIL/uL   Hemoglobin 7.9 (L) 13.0 - 17.0 g/dL   HCT 22.7 (L) 39.0 - 52.0 %   MCV 90.1 78.0 - 100.0 fL   MCH 31.3 26.0 - 34.0 pg   MCHC 34.8 30.0 - 36.0 g/dL   RDW 19.5 (H) 11.5 - 15.5 %   Platelets 459 (H) 150 - 400 K/uL   Neutrophils Relative % 72 %   Lymphocytes Relative 14 %   Monocytes Relative 13 %   Eosinophils Relative 1 %   Basophils Relative 0 %   nRBC 2 (H) 0 /100 WBC   Neutro Abs 16.0 (H) 1.7 - 7.7 K/uL   Lymphs Abs 3.1 0.7 - 4.0 K/uL   Monocytes Absolute 2.9 (H) 0.1 - 1.0 K/uL   Eosinophils Absolute 0.2 0.0 - 0.7 K/uL   Basophils Absolute 0.0 0.0 - 0.1 K/uL   RBC Morphology TARGET CELLS    WBC Morphology VACUOLATED NEUTROPHILS    Smear Review LARGE PLATELETS PRESENT   APTT  Result Value Ref Range   aPTT 43 (H) 24 - 37 seconds  Reticulocytes  Result Value Ref Range   Retic Ct Pct 18.3 (H) 0.4 - 3.1 %   RBC. 2.52 (L) 4.22 - 5.81 MIL/uL   Retic Count, Manual 461.2 (H) 19.0 - 186.0 K/uL   Comprehensive metabolic panel  Result Value Ref Range   Sodium 139 135 - 145 mmol/L   Potassium 3.7 3.5 - 5.1 mmol/L   Chloride 108 101 - 111 mmol/L   CO2 23 22 - 32 mmol/L   Glucose, Bld 123 (H) 65 - 99 mg/dL   BUN 13 6 - 20 mg/dL   Creatinine, Ser 0.93 0.61 - 1.24 mg/dL   Calcium 9.0 8.9 - 10.3 mg/dL   Total Protein 7.4 6.5 - 8.1 g/dL   Albumin 4.0 3.5 - 5.0 g/dL   AST 55 (H) 15 - 41 U/L   ALT 26 17 - 63 U/L   Alkaline Phosphatase 87 38 - 126 U/L   Total Bilirubin 5.7 (H) 0.3 - 1.2 mg/dL   GFR calc non Af Amer >60 >60 mL/min   GFR calc Af Amer >60 >60 mL/min   Anion gap 8 5 - 15  I-Stat CG4 Lactic Acid, ED (not at [facility])  Result Value Ref Range   Lactic Acid, Venous 0.82 0.5 - 2.0 mmol/L  I-Stat CG4 Lactic Acid, ED (not at [facility])  Result Value Ref Range   Lactic Acid, Venous 0.96 0.5 - 2.0 mmol/L   *Note: Due to a large number of results and/or encounters for the requested time period, some results have not been displayed. A complete set of results can be found in Results Review.   Dg Chest 2 View  12/06/2014  CLINICAL DATA:  Sickle cell crisis, 3 hours duration. Severe chest pain. EXAM: CHEST  2 VIEW COMPARISON:  11/26/2014 FINDINGS: There is a left subclavian Port-A-Cath with tip in the low SVC. There is unchanged moderate cardiomegaly. Mild central vascular prominence  is unchanged. No pleural effusions. No focal airspace consolidation. No pneumothorax. IMPRESSION: Unchanged cardiomegaly. No confluent airspace consolidation or pleural effusion. Electronically Signed   By: Andreas Newport M.D.   On: 12/06/2014 04:43   Dg Chest 2 View  11/26/2014  CLINICAL DATA:  Sickle cell crisis, shortness of breath. EXAM: CHEST  2 VIEW COMPARISON:  Chest x-ray of November 25, 2014 FINDINGS: The cardiopericardial silhouette remains enlarged. The pulmonary vascularity is less engorged today. The lungs are well-expanded. There is no focal infiltrate or pleural effusion. The power port appliance  tip is stable projecting over the distal third of the SVC just proximal to the cavoatrial junction. There are stable coarse retrocardiac lung markings. There is multilevel degenerative disc disease of the thoracic spine. IMPRESSION: Improved appearance of the pulmonary vascularity and pulmonary interstitium consistent with resolution of pulmonary edema. Stable cardiomegaly. There is no evidence of pneumonia. Electronically Signed   By: David  Martinique M.D.   On: 11/26/2014 15:30   Dg Chest 2 View  11/15/2014  CLINICAL DATA:  Back pain.  Sickle cell anemia. EXAM: CHEST  2 VIEW COMPARISON:  11/13/2014 FINDINGS: Cardiomegaly. Left subclavian vein Port-A-Cath is stable. No pneumothorax. Chronic changes at the lung bases. IMPRESSION: No active cardiopulmonary disease. Electronically Signed   By: Marybelle Killings M.D.   On: 11/15/2014 08:52   Dg Chest 2 View  11/13/2014  CLINICAL DATA:  Sickle cell pain crisis with left lateral chest wall pain EXAM: CHEST  2 VIEW COMPARISON:  10/26/2014 FINDINGS: Chronic cardiomegaly. Negative aortic and hilar contours. Stable left subclavian porta catheter placement, tip at the upper right atrium. Chronic interstitial coarsening consistent with scar. There is no edema, consolidation, effusion, or pneumothorax. No osseous findings to explain acute pain. Chronic humeral head osteonecrosis. Cholecystectomy. IMPRESSION: Stable lung scarring and cardiomegaly. No evidence of acute cardiopulmonary disease. Electronically Signed   By: Monte Fantasia M.D.   On: 11/13/2014 08:15   Dg Lumbar Spine Complete  11/15/2014  CLINICAL DATA:  Sickle cell pain crisis EXAM: LUMBAR SPINE - COMPLETE 4+ VIEW COMPARISON:  None. FINDINGS: Five lumbar type vertebral bodies. Straightening of the lumbar spine. No evidence of fracture or dislocation. Vertebral body heights are maintained. Mild superior/ inferior endplate changes at L5 characteristic of sickle cell. Visualized bony pelvis appears intact.  IMPRESSION: No fracture or dislocation is seen. Electronically Signed   By: Julian Hy M.D.   On: 11/15/2014 11:22   Dg Pelvis 1-2 Views  11/15/2014  CLINICAL DATA:  Sickle cell pain crisis EXAM: PELVIS - 1-2 VIEW COMPARISON:  CT right hip dated 10/27/2013 FINDINGS: Right hip arthroplasty in satisfactory position. No fracture or dislocation is seen. Left hip joint space is preserved. Visualized bony pelvis appears intact. IMPRESSION: Right hip arthroplasty satisfactory position. No fracture or dislocation is seen. Electronically Signed   By: Julian Hy M.D.   On: 11/15/2014 11:19   Ct Angio Chest W/cm &/or Wo Cm  11/30/2014  CLINICAL DATA:  Shortness of breath. EXAM: CT ANGIOGRAPHY CHEST WITH CONTRAST TECHNIQUE: Multidetector CT imaging of the chest was performed using the standard protocol during bolus administration of intravenous contrast. Multiplanar CT image reconstructions and MIPs were obtained to evaluate the vascular anatomy. CONTRAST:  116mL OMNIPAQUE IOHEXOL 350 MG/ML SOLN COMPARISON:  10/26/2014 FINDINGS: Mediastinum: Overall examination is suboptimal. Poor pulmonary arterial opacification and respiratory motion artifact diminishes exam detail. The heart size is moderately enlarged. No pericardial effusion. Prominent anterior mediastinal lymph nodes are again noted, unchanged from previous exam  compatible with adenopathy. The trachea appears patent and is midline. Unremarkable appearance of the esophagus. Left upper lobe segmental pulmonary artery filling defect is identified, image 88 of series 14. Lungs/Pleura: No pleural effusion identified. There is atelectasis noted in both lung bases. No airspace consolidation. Upper Abdomen: The visualized portions of the liver are diffusely increased and attenuation. Calcified spleen noted. Musculoskeletal: Bony stigmata of sickle cell disease identified. Review of the MIP images confirms the above findings. IMPRESSION: 1. Exam detail  diminished secondary to respiratory motion artifact and poor pulmonary arterial opacification. 2. Examination is positive for at least 1 segmental branch pulmonary artery filling defect compatible with acute pulmonary embolus. 3. Chronic changes of sickle cell disease. Electronically Signed   By: Kerby Moors M.D.   On: 11/30/2014 10:28   Ct Angio Chest Pe W/cm &/or Wo Cm  11/13/2014  CLINICAL DATA:  Chest pain.  Sickle cell disease EXAM: CT ANGIOGRAPHY CHEST WITH CONTRAST TECHNIQUE: Multidetector CT imaging of the chest was performed using the standard protocol during bolus administration of intravenous contrast. Multiplanar CT image reconstructions and MIPs were obtained to evaluate the vascular anatomy. CONTRAST:  133mL OMNIPAQUE IOHEXOL 350 MG/ML SOLN COMPARISON:  Chest CT October 26, 2014 and November 13, 2014 chest radiograph FINDINGS: There is a small pulmonary embolus in the posterior segment right lower lobe pulmonary artery. No more central pulmonary emboli are identified. No evidence of right heart strain. There is no thoracic aortic aneurysm or dissection. The visualized great vessels appear normal. There is a small area of patchy infiltrate in the anterior segment of the left upper lobe, stable. There is patchy atelectatic change in both bases, stable. No new opacity is appreciable. Cardiomegaly is stable.  The pericardium is not thickened. Visualized thyroid appears normal. Anterior mediastinal adenopathy remains, stable. The largest individual lymph node in the anterior mediastinum measures 2.3 x 1.5 cm, unchanged. There is a single prominent lymph node in the right hilum measuring 1.6 x 1.2 cm. There is no new adenopathy. Central catheter tip is near the cavoatrial junction. In the visualized upper abdomen, there is diffuse calcification of the spleen consistent with a degree of autoinfarction, stable. There are multiple thoracic spine end plate infarcts consistent with known sickle cell  disease. Bones overall are sclerotic consistent with the chronic anemia. There is a probable sebaceous cyst measuring 2.1 x 1.1 cm in the right anterior chest wall, stable. Review of the MIP images confirms the above findings. IMPRESSION: Small pulmonary embolus in a posterior segment right lower lobe pulmonary artery branch, not present recently and presumed new. No other pulmonary emboli identified. Stable cardiomegaly and anterior mediastinal adenopathy. Single prominent lymph node in the right hilum. Stable areas of patchy lower lobe atelectasis and mild patchy infiltrate in the left upper lobe. No new parenchymal lung opacity. 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 prior splenic autoinfarction. Critical Value/emergent results were called by telephone at the time of interpretation on 11/13/2014 at 12:32 pm to Dr. Joseph Berkshire , who verbally acknowledged these results. Electronically Signed   By: Lowella Grip III M.D.   On: 11/13/2014 12:34   Dg Chest Port 1 View  11/25/2014  CLINICAL DATA:  Current history of sickle cell disease, presenting with hypoxia. Recent diagnosis of pulmonary embolus. EXAM: PORTABLE CHEST 1 VIEW COMPARISON:  Chest x-ray 11/15/2014 and earlier. CTA chest 11/13/2014. CT chest 10/26/2014 and earlier. FINDINGS: Cardiac silhouette massively enlarged, unchanged. Widening of the superior mediastinum, unchanged, shown on prior  CT to be due to anterior mediastinal lymphadenopathy. Stable scarring in the lung bases. Lungs otherwise clear. No localized airspace consolidation. No pleural effusions. No pneumothorax. Normal pulmonary vascularity. Left subclavian Port-A-Cath tip projects at the cavoatrial junction, unchanged. IMPRESSION: Stable massive cardiomegaly.  No acute cardiopulmonary disease. Electronically Signed   By: Evangeline Dakin M.D.   On: 11/25/2014 10:01     Medications  HYDROmorphone (DILAUDID) injection 2 mg (2 mg Intravenous Given  12/06/14 0417)  ondansetron (ZOFRAN) injection 4 mg (4 mg Intravenous Given 12/06/14 0412)  sodium chloride 0.9 % bolus 500 mL (0 mLs Intravenous Stopped 12/06/14 0527)  ketorolac (TORADOL) 30 MG/ML injection 30 mg (30 mg Intravenous Given 12/06/14 0413)  diphenhydrAMINE (BENADRYL) capsule 25 mg (25 mg Oral Given 12/06/14 0411)  HYDROmorphone (DILAUDID) injection 1 mg (1 mg Intravenous Given 12/06/14 0535)  HYDROmorphone (DILAUDID) injection 1 mg (1 mg Intravenous Given 12/06/14 0730)     I personally performed the services described in this documentation, which was scribed in my presence. The recorded information has been reviewed and is accurate.      Veatrice Kells, MD 12/06/14 [phone]

## 2014-12-06 NOTE — Progress Notes (Signed)
Utilization Review Completed.Blessyn [place] T11/06/2014  

## 2014-12-06 NOTE — ED Notes (Signed)
Patient transported to X-ray 

## 2014-12-06 NOTE — ED Notes (Signed)
Per EMS , pt. Is from home with complaint of sickle cell crisis, complaint of pain on right side at 10/10 which started at 2am, alert and oriented x4, placed on O2 [place].

## 2014-12-07 DIAGNOSIS — I110 Hypertensive heart disease with heart failure: Secondary | ICD-10-CM | POA: Diagnosis not present

## 2014-12-07 DIAGNOSIS — Z9981 Dependence on supplemental oxygen: Secondary | ICD-10-CM | POA: Diagnosis not present

## 2014-12-07 DIAGNOSIS — J9611 Chronic respiratory failure with hypoxia: Secondary | ICD-10-CM | POA: Diagnosis not present

## 2014-12-07 DIAGNOSIS — D5700 Hb-SS disease with crisis, unspecified: Secondary | ICD-10-CM | POA: Diagnosis not present

## 2014-12-07 DIAGNOSIS — I272 Other secondary pulmonary hypertension: Secondary | ICD-10-CM | POA: Diagnosis not present

## 2014-12-07 DIAGNOSIS — I5022 Chronic systolic (congestive) heart failure: Secondary | ICD-10-CM | POA: Diagnosis not present

## 2014-12-07 DIAGNOSIS — M79606 Pain in leg, unspecified: Secondary | ICD-10-CM | POA: Diagnosis not present

## 2014-12-07 LAB — CBC WITH DIFFERENTIAL/PLATELET
BASOS PCT: 1 %
Basophils Absolute: 0.1 10*3/uL (ref 0.0–0.1)
EOS ABS: 0.8 10*3/uL — AB (ref 0.0–0.7)
Eosinophils Relative: 5 %
HEMATOCRIT: 19.6 % — AB (ref 39.0–52.0)
HEMOGLOBIN: 6.9 g/dL — AB (ref 13.0–17.0)
LYMPHS ABS: 3.5 10*3/uL (ref 0.7–4.0)
Lymphocytes Relative: 21 %
MCH: 32.4 pg (ref 26.0–34.0)
MCHC: 35.2 g/dL (ref 30.0–36.0)
MCV: 92 fL (ref 78.0–100.0)
MONOS PCT: 12 %
Monocytes Absolute: 2.1 10*3/uL — ABNORMAL HIGH (ref 0.1–1.0)
NEUTROS ABS: 10.4 10*3/uL — AB (ref 1.7–7.7)
NEUTROS PCT: 62 %
Platelets: 435 10*3/uL — ABNORMAL HIGH (ref 150–400)
RBC: 2.13 MIL/uL — AB (ref 4.22–5.81)
RDW: 19.3 % — ABNORMAL HIGH (ref 11.5–15.5)
WBC: 16.9 10*3/uL — AB (ref 4.0–10.5)

## 2014-12-07 LAB — COMPREHENSIVE METABOLIC PANEL
ALT: 23 U/L (ref 17–63)
ANION GAP: 5 (ref 5–15)
AST: 37 U/L (ref 15–41)
Albumin: 3.9 g/dL (ref 3.5–5.0)
Alkaline Phosphatase: 86 U/L (ref 38–126)
BUN: 15 mg/dL (ref 6–20)
CHLORIDE: 111 mmol/L (ref 101–111)
CO2: 23 mmol/L (ref 22–32)
CREATININE: 0.82 mg/dL (ref 0.61–1.24)
Calcium: 8.8 mg/dL — ABNORMAL LOW (ref 8.9–10.3)
Glucose, Bld: 112 mg/dL — ABNORMAL HIGH (ref 65–99)
POTASSIUM: 4.1 mmol/L (ref 3.5–5.1)
SODIUM: 139 mmol/L (ref 135–145)
Total Bilirubin: 4.4 mg/dL — ABNORMAL HIGH (ref 0.3–1.2)
Total Protein: 6.9 g/dL (ref 6.5–8.1)

## 2014-12-07 IMAGING — CR DG CHEST 2V
2 series · 2 of 2 positions shown · non-contrast
Comparison: 01/05/2012

CLINICAL DATA: Left chest pain, sickle cell crisis

CHEST - 2 VIEW

[w chest pa]
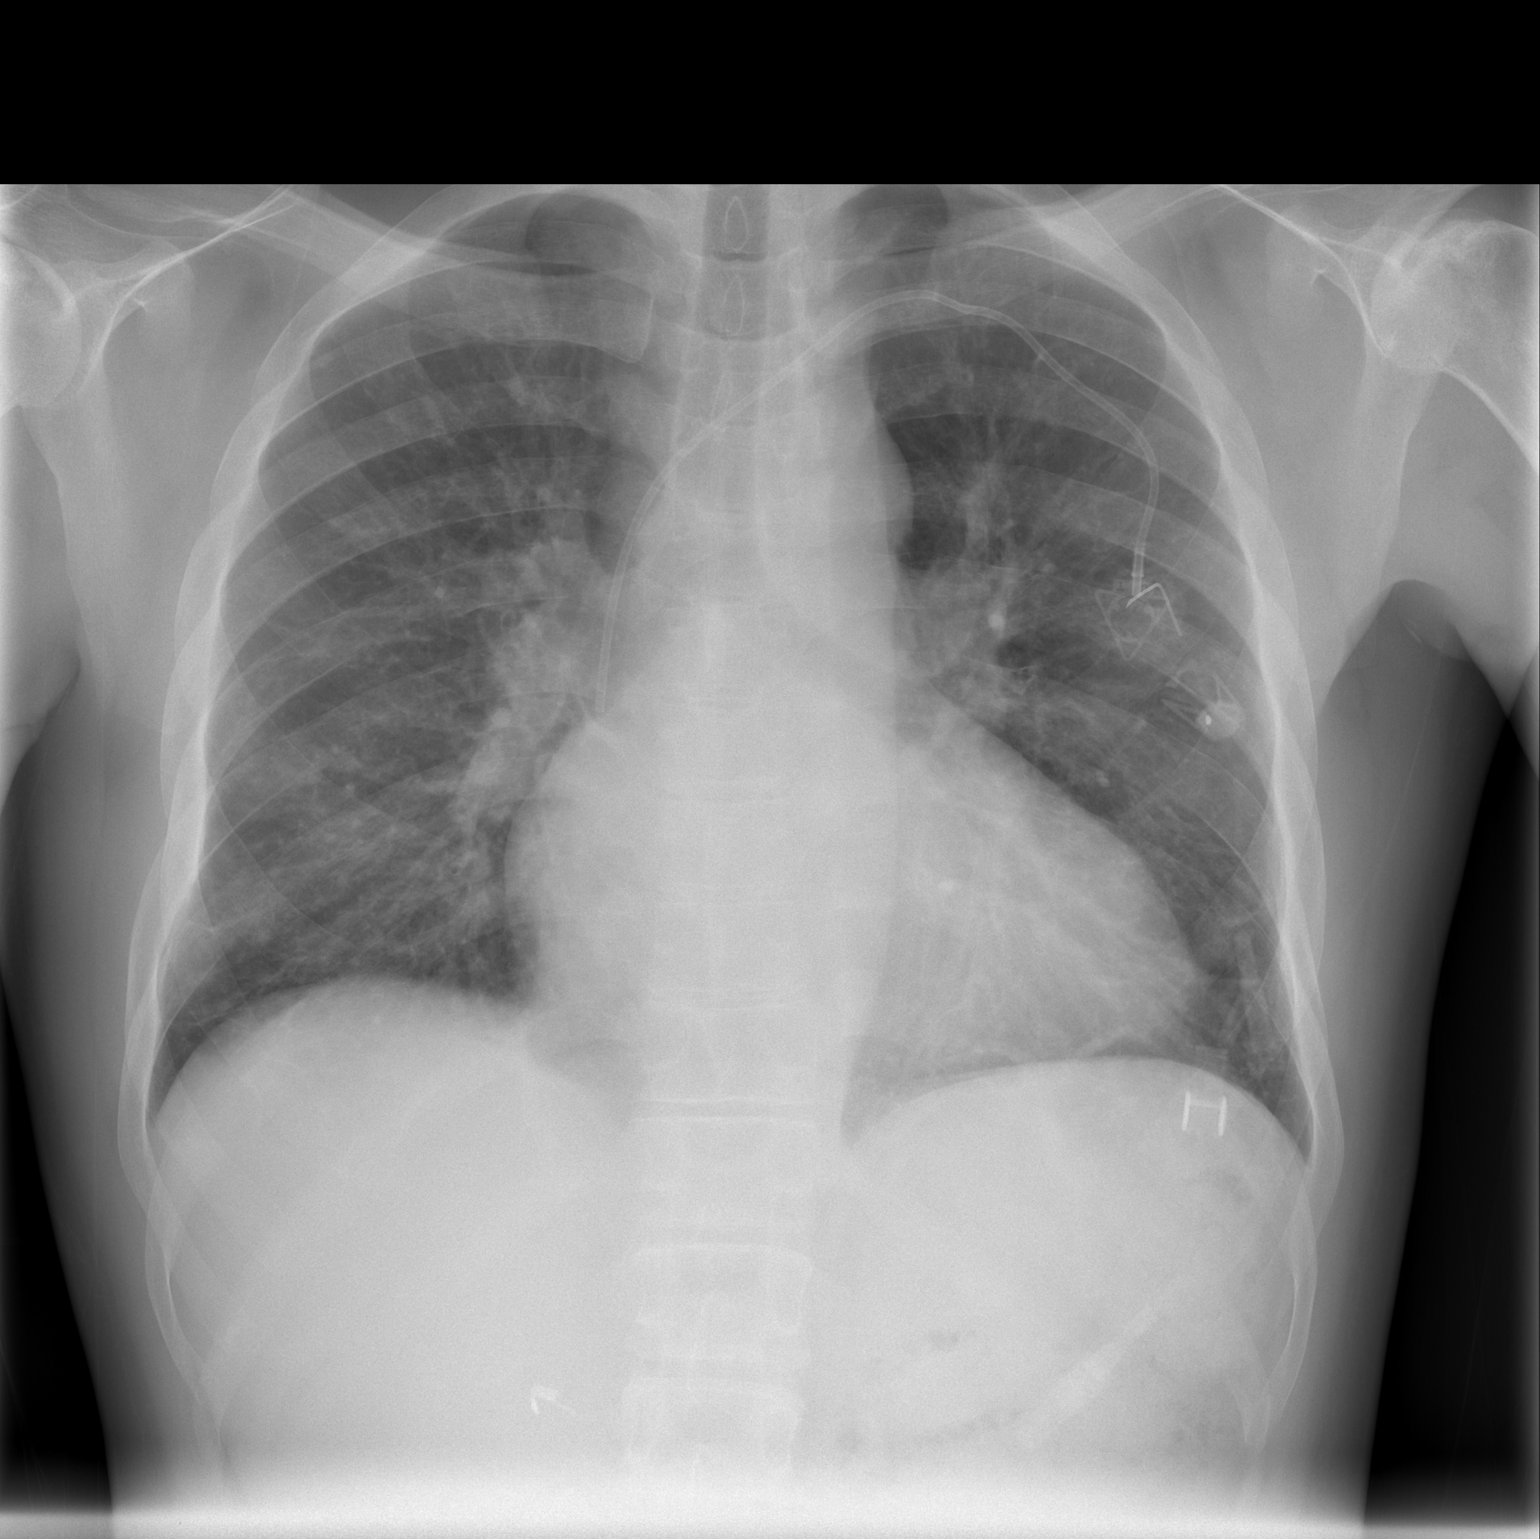

[w chest lat]
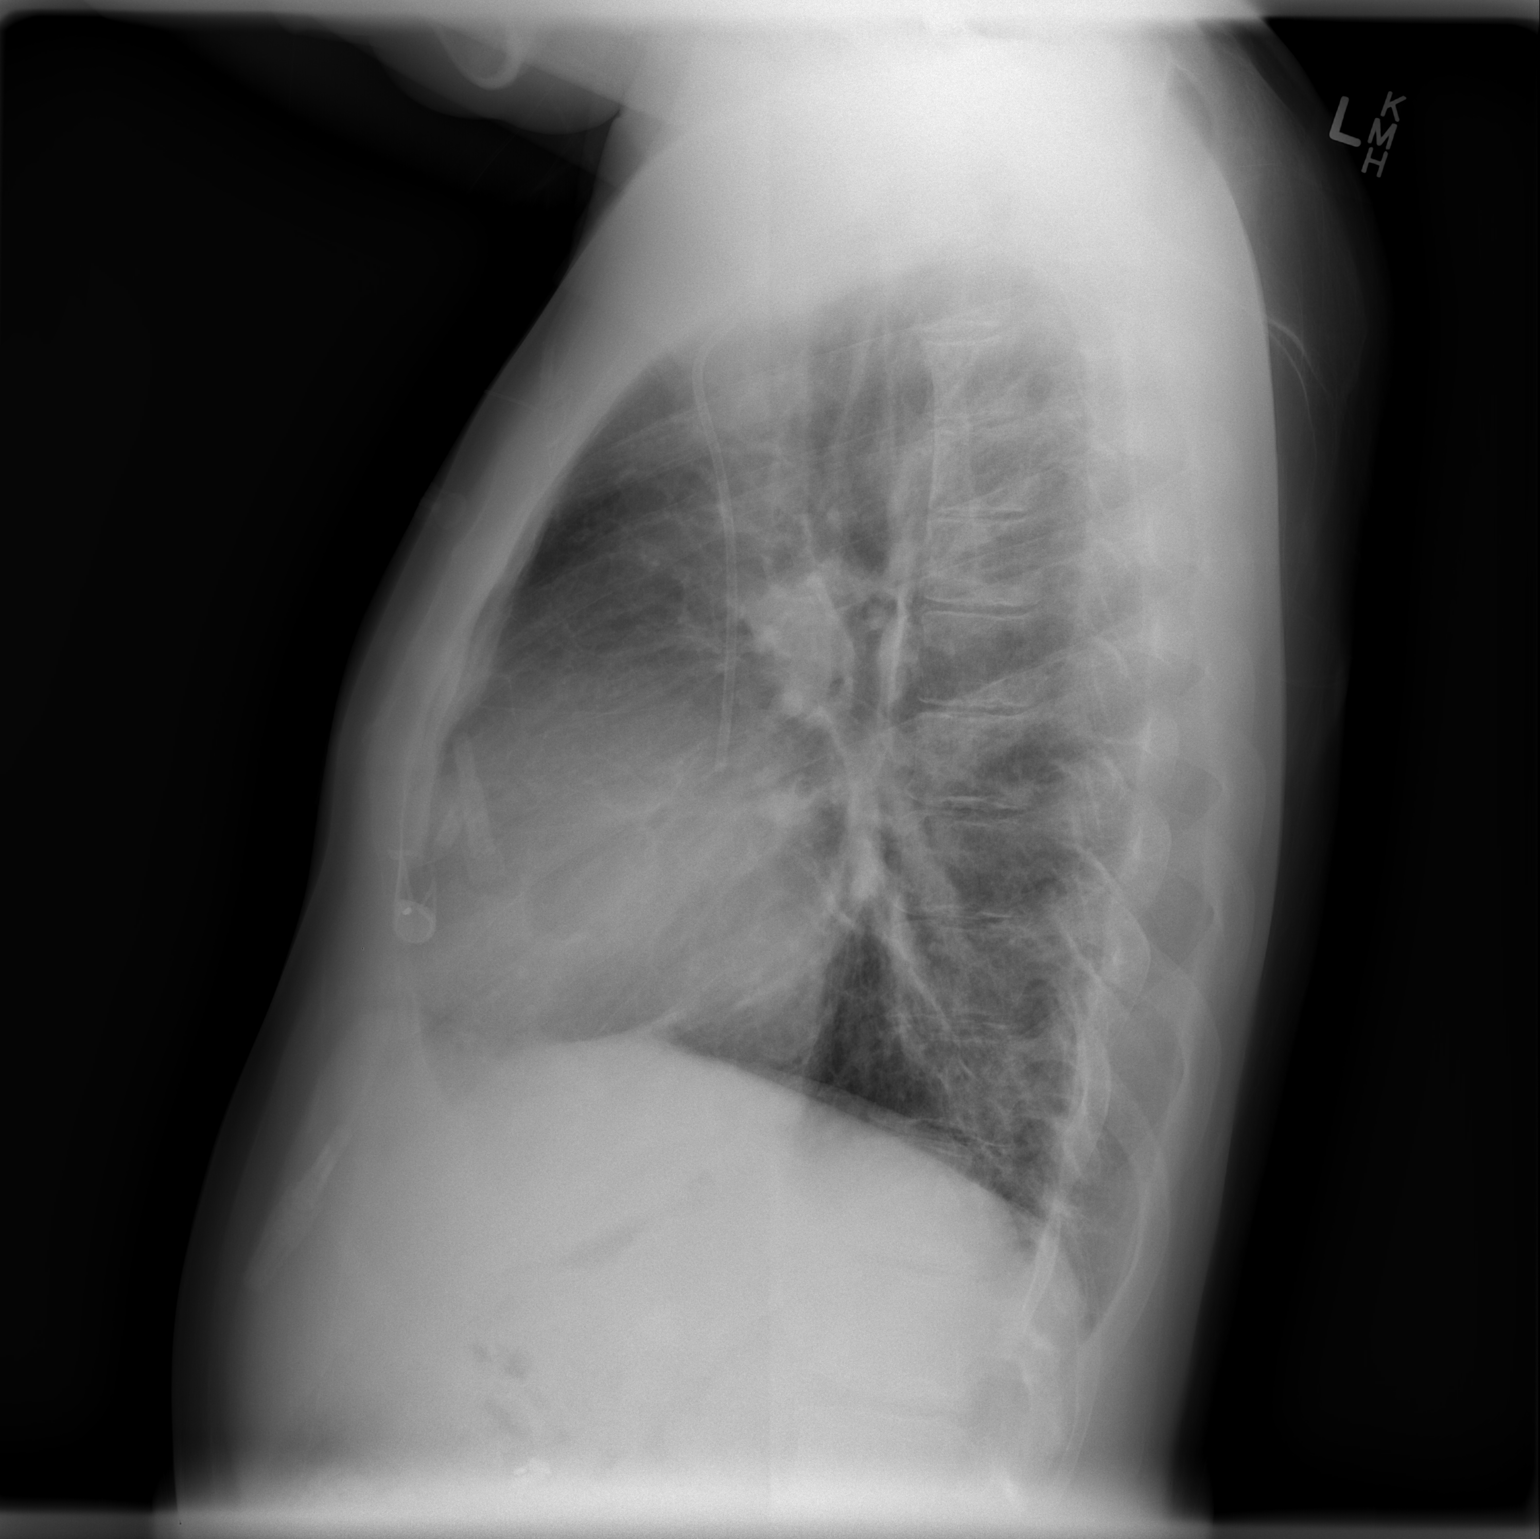

[2 of 2 positions shown; findings below may reference images not displayed]

FINDINGS: Increased interstitial markings/bronchitic changes.  No
focal consolidation. No pleural effusion or pneumothorax.

Stable mild cardiomegaly.

Left chest power port.

Visualized osseous structures are within normal limits.

Cholecystectomy clips.
IMPRESSION: No evidence of acute cardiopulmonary disease.

Increased interstitial markings/bronchitic changes, stable.

## 2014-12-07 MED ORDER — HYDROMORPHONE 1 MG/ML IV SOLN
INTRAVENOUS | Status: DC
  Administered 2014-12-07: 8.74 mg via INTRAVENOUS
  Administered 2014-12-07: 5.6 mg via INTRAVENOUS
  Administered 2014-12-07: 1 mg via INTRAVENOUS
  Administered 2014-12-08: 4.8 mg via INTRAVENOUS
  Administered 2014-12-08: 09:00:00 via INTRAVENOUS
  Administered 2014-12-08: 8.8 mg via INTRAVENOUS
  Administered 2014-12-08 (×2): 8 mg via INTRAVENOUS
  Filled 2014-12-07 (×2): qty 25

## 2014-12-07 NOTE — Progress Notes (Signed)
CRITICAL VALUE ALERT  Critical value received:  hgb 6.9  Date of notification:  12/06/2014  Time of notification:  8828  Critical value read back:Yes.    Nurse who received alert:  Michele Mcalpine   MD notified (1st page):  Jonelle Sidle  Time of first page:  [phone]  MD notified (2nd page):  Time of second page:  Responding MD:  Jonelle Sidle  Time MD responded:  [phone]

## 2014-12-07 NOTE — Progress Notes (Signed)
Subjective: Patient is feeling much better today but still having pain at 8 out of 10. He was a change in his medications. Denied any shortness of breath or cough. Pain is mainly in his back and lower legs. He is on Dilaudid PCA with the bolus dose of 0.6 mg. Also on Toradol. He has used 28.8 mg with 51 demands and 48 deliveries in the last 24 hours. No fever or chills.  Objective: Vital signs in last 24 hours: Temp:  [97.5 F (36.4 C)-98.5 F (36.9 C)] 98.4 F (36.9 C) (11/06 0646) Pulse Rate:  [67-84] 67 (11/06 0646) Resp:  [13-19] 15 (11/06 0759) BP: (99-118)/(63-74) 103/70 mmHg (11/06 0646) SpO2:  [90 %-98 %] 98 % (11/06 0759) FiO2 (%):  [36 %-40 %] 40 % (11/06 0759) Weight:  [72.122 kg (159 lb)] 72.122 kg (159 lb) (11/05 1500) Weight change:  Last BM Date: 12/06/14  Intake/Output from previous day: 11/05 0701 - 11/06 0700 In: 2493.3 [P.O.:360; I.V.:2133.3] Out: 850 [Urine:850] Intake/Output this shift:    General appearance: alert, cooperative and no distress Head: Normocephalic, without obvious abnormality, atraumatic Neck: no adenopathy, no carotid bruit, no JVD, supple, symmetrical, trachea midline and thyroid not enlarged, symmetric, no tenderness/mass/nodules Back: symmetric, no curvature. ROM normal. No CVA tenderness. Resp: clear to auscultation bilaterally Chest wall: no tenderness Cardio: regular rate and rhythm, S1, S2 normal, no murmur, click, rub or gallop GI: soft, non-tender; bowel sounds normal; no masses,  no organomegaly Extremities: extremities normal, atraumatic, no cyanosis or edema Pulses: 2+ and symmetric Skin: Skin color, texture, turgor normal. No rashes or lesions Neurologic: Grossly normal  Lab Results:  Recent Labs  12/06/14 0350 12/07/14 0518  WBC 22.2* 16.9*  HGB 7.9* 6.9*  HCT 22.7* 19.6*  PLT 459* 435*   BMET  Recent Labs  12/06/14 0350 12/07/14 0518  NA 139 139  K 3.7 4.1  CL 108 111  CO2 23 23  GLUCOSE 123* 112*  BUN 13  15  CREATININE 0.93 0.82  CALCIUM 9.0 8.8*    Studies/Results: Dg Chest 2 View  12/06/2014  CLINICAL DATA:  Sickle cell crisis, 3 hours duration. Severe chest pain. EXAM: CHEST  2 VIEW COMPARISON:  11/26/2014 FINDINGS: There is a left subclavian Port-A-Cath with tip in the low SVC. There is unchanged moderate cardiomegaly. Mild central vascular prominence is unchanged. No pleural effusions. No focal airspace consolidation. No pneumothorax. IMPRESSION: Unchanged cardiomegaly. No confluent airspace consolidation or pleural effusion. Electronically Signed   By: Andreas Newport M.D.   On: 12/06/2014 04:43    Medications: I have reviewed the patient's current medications.  Assessment/Plan: A 34 year old gentleman admitted with sickle cell crisis. #1 sickle cell painful crisis: Patient is not getting adequate relief. I will increase his bolus dose to 0.8 mg today. If this is still not controlling his pain I will consider physician assisted dosing. Continue Toradol and supportive care. Also his long-acting oral medication. #2 sickle cell anemia: Hemoglobin dropped to 6.9. Most likely due to hemodilution. No evidence of active hemolysis at this point. Monitor closely no transfusion. #3 chronic anticoagulation: Continue Lovenox. No evidence of bleeding. #4 pulmonary hypertension: Stable. Continue oxygen #5 hypertension: Blood pressure seems controlled. Continue monitoring. #6 leukocytosis: Due to vaso-occlusive crisis. White cell coming down from 22,000-16,000 today.   LOS: 1 day   Asiya Cutbirth,[name] 12/07/2014, 35 8:32 AM

## 2014-12-08 ENCOUNTER — Encounter: Payer: Self-pay | Admitting: Cardiovascular Disease

## 2014-12-08 DIAGNOSIS — J9611 Chronic respiratory failure with hypoxia: Secondary | ICD-10-CM

## 2014-12-08 DIAGNOSIS — R55 Syncope and collapse: Secondary | ICD-10-CM | POA: Diagnosis not present

## 2014-12-08 DIAGNOSIS — I272 Other secondary pulmonary hypertension: Secondary | ICD-10-CM | POA: Diagnosis not present

## 2014-12-08 DIAGNOSIS — Z9981 Dependence on supplemental oxygen: Secondary | ICD-10-CM | POA: Diagnosis not present

## 2014-12-08 DIAGNOSIS — D5700 Hb-SS disease with crisis, unspecified: Secondary | ICD-10-CM | POA: Diagnosis not present

## 2014-12-08 DIAGNOSIS — D638 Anemia in other chronic diseases classified elsewhere: Secondary | ICD-10-CM | POA: Diagnosis not present

## 2014-12-08 DIAGNOSIS — I110 Hypertensive heart disease with heart failure: Secondary | ICD-10-CM | POA: Diagnosis not present

## 2014-12-08 DIAGNOSIS — Z7901 Long term (current) use of anticoagulants: Secondary | ICD-10-CM

## 2014-12-08 DIAGNOSIS — M79606 Pain in leg, unspecified: Secondary | ICD-10-CM | POA: Diagnosis not present

## 2014-12-08 DIAGNOSIS — I5022 Chronic systolic (congestive) heart failure: Secondary | ICD-10-CM | POA: Diagnosis not present

## 2014-12-08 LAB — CBC WITH DIFFERENTIAL/PLATELET
Basophils Absolute: 0.1 10*3/uL (ref 0.0–0.1)
Basophils Relative: 1 %
EOS PCT: 6 %
Eosinophils Absolute: 1 10*3/uL — ABNORMAL HIGH (ref 0.0–0.7)
HCT: 19.2 % — ABNORMAL LOW (ref 39.0–52.0)
Hemoglobin: 6.7 g/dL — CL (ref 13.0–17.0)
LYMPHS ABS: 3.3 10*3/uL (ref 0.7–4.0)
LYMPHS PCT: 20 %
MCH: 31.9 pg (ref 26.0–34.0)
MCHC: 34.9 g/dL (ref 30.0–36.0)
MCV: 91.4 fL (ref 78.0–100.0)
MONO ABS: 2.4 10*3/uL — AB (ref 0.1–1.0)
Monocytes Relative: 14 %
Neutro Abs: 9.8 10*3/uL — ABNORMAL HIGH (ref 1.7–7.7)
Neutrophils Relative %: 59 %
PLATELETS: 454 10*3/uL — AB (ref 150–400)
RBC: 2.1 MIL/uL — ABNORMAL LOW (ref 4.22–5.81)
RDW: 19 % — AB (ref 11.5–15.5)
WBC: 16.6 10*3/uL — ABNORMAL HIGH (ref 4.0–10.5)

## 2014-12-08 LAB — COMPREHENSIVE METABOLIC PANEL
ALBUMIN: 4.3 g/dL (ref 3.5–5.0)
ALT: 24 U/L (ref 17–63)
AST: 41 U/L (ref 15–41)
Alkaline Phosphatase: 86 U/L (ref 38–126)
Anion gap: 6 (ref 5–15)
BUN: 15 mg/dL (ref 6–20)
CHLORIDE: 110 mmol/L (ref 101–111)
CO2: 23 mmol/L (ref 22–32)
Calcium: 8.9 mg/dL (ref 8.9–10.3)
Creatinine, Ser: 0.74 mg/dL (ref 0.61–1.24)
GFR calc Af Amer: >60 mL/min (ref >60)
GFR calc non Af Amer: >60 mL/min (ref >60)
GLUCOSE: 110 mg/dL — AB (ref 65–99)
POTASSIUM: 4.3 mmol/L (ref 3.5–5.1)
Sodium: 139 mmol/L (ref 135–145)
Total Bilirubin: 4.5 mg/dL — ABNORMAL HIGH (ref 0.3–1.2)
Total Protein: 7.3 g/dL (ref 6.5–8.1)

## 2014-12-08 LAB — RETICULOCYTES
RBC.: 2.49 MIL/uL — ABNORMAL LOW (ref 4.22–5.81)
Retic Count, Absolute: 326.2 10*3/uL — ABNORMAL HIGH (ref 19.0–186.0)
Retic Ct Pct: 13.1 % — ABNORMAL HIGH (ref 0.4–3.1)

## 2014-12-08 MED ORDER — HYDROMORPHONE HCL 2 MG/ML IJ SOLN: 2.0000 mg | INTRAMUSCULAR | Status: DC | PRN

## 2014-12-08 MED ORDER — HYDROMORPHONE 1 MG/ML IV SOLN
INTRAVENOUS | Status: DC
  Administered 2014-12-08: 9 mg via INTRAVENOUS
  Administered 2014-12-08: 21:00:00 via INTRAVENOUS
  Administered 2014-12-08: 9.48 mg via INTRAVENOUS
  Administered 2014-12-08: 3.6 mg via INTRAVENOUS
  Administered 2014-12-09: 6 mg via INTRAVENOUS
  Administered 2014-12-09: 1.8 mg via INTRAVENOUS
  Administered 2014-12-09: 7.2 mg via INTRAVENOUS
  Administered 2014-12-09: 0.6 mg via INTRAVENOUS
  Administered 2014-12-09: 5.4 mg via INTRAVENOUS
  Administered 2014-12-10: 10.8 mg via INTRAVENOUS
  Administered 2014-12-10: 10:00:00 via INTRAVENOUS
  Administered 2014-12-10: 5.4 mg via INTRAVENOUS
  Administered 2014-12-10: 4.9 mg via INTRAVENOUS
  Administered 2014-12-10: 7.2 mg via INTRAVENOUS
  Filled 2014-12-08 (×3): qty 25

## 2014-12-08 NOTE — Progress Notes (Signed)
CRITICAL VALUE ALERT  Critical value received:  Hgb 6.7  Date of notification:  12/08/2014  Time of notification:  0614  Critical value read back:Yes.    Nurse who received alert:  Hortencia Conradi  MD notified (1st page):  [place]  Time of first page:  [phone]  MD notified (2nd page):  Time of second page:  Responding MD:  Rachelle Hora  Time MD responded:  [phone]

## 2014-12-08 NOTE — Progress Notes (Signed)
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 Hb SS with crisis: Pt has used 45.54 mg with 65/57:demands/deliveries. Continue PCA but adjust bolus dose to 0.6 mg and add clinician assisted doses as needed for better control of pain. Continue Toradol. 2. Pre-Syncopal: Pt again was without his Oxygen and had an episode of dizziness and subsequent trigger of crisis. I have again spoken with patient about the importance of wearing Oxygen at all times. I have also discussed that is continued decision to not use Oxygen will likely lead to an early death and maybe should consider Hospice if not willing to comply with treatment that would limit complications and decrease rate of progression of disease. 3. Leukocytosis: Pt has a chronic leukocytosis which is close to baseline currently. He has no indication of infection.  4. Anemia of chronic disease: Pt at baseline Hb currently. Continue to monitor. 5. 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 Continue Lovenox at 120 mg daily.  6. Chronic pain: Continue MS Contin. 7. Chronic Hypoxic Respiratory Failure: Continue Oxygen 4 l/min 8. Medication non-compliance: Pt continues to be non-adherent  to Oxygen. In light of the severity of illness I am considering palliative care consult for goals of care.   Code Status: Full Code Family Communication: N/A Disposition Plan: Not yet ready for discharge  [place].  Pager [phone]. If 7PM-7AM, please contact night-coverage.  12/08/2014, 3:28 PM  LOS: 2 days    Consultants:  None  Procedures:  None  Antibiotics:  None  HPI/Subjective: Pt rates pain as 8/10 localized to right side  ribs.  Objective: Filed Vitals:   12/08/14 0800 12/08/14 0916 12/08/14 1041 12/08/14 1154  BP:   122/88   Pulse:   96   Temp:   98.7 F (37.1 C)   TempSrc:   Oral   Resp: 15 16 18 13   Height:      Weight:      SpO2: 97% 98% 98% 100%   Weight change: 5 lb 0.4 oz (2.278 kg)  Intake/Output Summary (Last 24 hours) at 12/08/14 1528 Last data filed at 12/08/14 1340  Gross per 24 hour  Intake 3571.07 ml  Output   2150 ml  Net 1421.07 ml    General: Alert, awake, oriented x3, in moderate distress.  HEENT: [place]/AT PEERL, EOMI, anicteric. Neck: Trachea midline,  no masses, no thyromegal,y no JVD, no carotid bruit OROPHARYNX:  Moist, No exudate/ erythema/lesions.  Heart: Regular rate and rhythm, without murmurs, rubs, gallops, PMI non-displaced, no heaves or thrills on palpation.  Lungs: Clear to auscultation, no wheezing or rhonchi noted. No increased vocal fremitus resonant to percussion  Abdomen: Soft, nontender, nondistended, positive bowel sounds, no masses no hepatosplenomegaly noted.  Neuro: No focal neurological deficits noted cranial nerves II through XII grossly intact.Strength at functional baseline in bilateral upper and lower extremities. Musculoskeletal: No warm swelling or erythema around joints, no spinal tenderness noted. Psychiatric: Patient alert and oriented x3, good insight and cognition, good recent to remote recall.    Data Reviewed: Basic Metabolic Panel:  Recent Labs Lab 12/02/14 0840 12/06/14 0350 12/06/14 0950 12/07/14 0518 12/08/14 0532  NA 137 139  --  139 139  K 4.2 3.7  --  4.1 4.3  CL 111 108  --  111 110  CO2 22 23  --  23 23  GLUCOSE 98 123*  --  112* 110*  BUN 9 13  --  15 15  CREATININE 0.63 0.93  --  0.82 0.74  CALCIUM 8.3* 9.0  --  8.8* 8.9  MG  --   --  1.7  --   --    Liver Function Tests:  Recent Labs Lab 12/06/14 0350 12/07/14 0518 12/08/14 0532  AST 55* 37 41  ALT 26 23 24   ALKPHOS 87 86 86  BILITOT 5.7* 4.4* 4.5*   PROT 7.4 6.9 7.3  ALBUMIN 4.0 3.9 4.3   No results for input(s): LIPASE, AMYLASE in the last 168 hours. No results for input(s): AMMONIA in the last 168 hours. CBC:  Recent Labs Lab 12/02/14 0840 12/03/14 1215 12/06/14 0350 12/07/14 0518 12/08/14 0532  WBC 14.8* 10.9* 22.2* 16.9* 16.6*  NEUTROABS 9.4* 6.3 16.0* 10.4* 9.8*  HGB 6.9* 6.8* 7.9* 6.9* 6.7*  HCT 19.4* 19.1* 22.7* 19.6* 19.2*  MCV 90.7 91.0 90.1 92.0 91.4  PLT 366 386 459* 435* 454*   Cardiac Enzymes:  Recent Labs Lab 12/02/14 0245 12/02/14 0840 12/02/14 1442 12/02/14 2042 12/03/14 0315  TROPONINI 0.51* 0.41* 0.43* 0.44* 0.42*   BNP (last 3 results)  Recent Labs  08/29/14 0810 09/10/14 0612 11/30/14 0910  BNP 1117.0* 549.5* 639.7*    ProBNP (last 3 results)  Recent Labs  01/01/14 0909  PROBNP 5790.0*    CBG:  Recent Labs Lab 12/06/14 0920  GLUCAP 115*    No results found for this or any previous visit (from the past 240 hour(s)).   Studies: Dg Chest 2 View  12/06/2014  CLINICAL DATA:  Sickle cell crisis, 3 hours duration. Severe chest pain. EXAM: CHEST  2 VIEW COMPARISON:  11/26/2014 FINDINGS: There is a left subclavian Port-A-Cath with tip in the low SVC. There is unchanged moderate cardiomegaly. Mild central vascular prominence is unchanged. No pleural effusions. No focal airspace consolidation. No pneumothorax. IMPRESSION: Unchanged cardiomegaly. No confluent airspace consolidation or pleural effusion. Electronically Signed   By: Andreas Newport M.D.   On: 12/06/2014 04:43   Dg Chest 2 View  11/26/2014  CLINICAL DATA:  Sickle cell crisis, shortness of breath. EXAM: CHEST  2 VIEW COMPARISON:  Chest x-ray of November 25, 2014 FINDINGS: The cardiopericardial silhouette remains enlarged. The pulmonary vascularity is less engorged today. The lungs are well-expanded. There is no focal infiltrate or pleural effusion. The power port appliance tip is stable projecting over the distal third of  the SVC just proximal to the cavoatrial junction. There are stable coarse retrocardiac lung markings. There is multilevel degenerative disc disease of the thoracic spine. IMPRESSION: Improved appearance of the pulmonary vascularity and pulmonary interstitium consistent with resolution of pulmonary edema. Stable cardiomegaly. There is no evidence of pneumonia. Electronically Signed   By: David  Martinique M.D.   On: 11/26/2014 15:30   Dg Chest 2 View  11/15/2014  CLINICAL DATA:  Back pain.  Sickle cell anemia. EXAM: CHEST  2 VIEW COMPARISON:  11/13/2014 FINDINGS: Cardiomegaly. Left subclavian vein Port-A-Cath is stable. No pneumothorax. Chronic changes at the lung bases. IMPRESSION: No active cardiopulmonary disease. Electronically Signed   By: Marybelle Killings M.D.   On: 11/15/2014 08:52   Dg Chest 2 View  11/13/2014  CLINICAL DATA:  Sickle cell pain crisis with left lateral chest wall pain EXAM: CHEST  2 VIEW COMPARISON:  10/26/2014  FINDINGS: Chronic cardiomegaly. Negative aortic and hilar contours. Stable left subclavian porta catheter placement, tip at the upper right atrium. Chronic interstitial coarsening consistent with scar. There is no edema, consolidation, effusion, or pneumothorax. No osseous findings to explain acute pain. Chronic humeral head osteonecrosis. Cholecystectomy. IMPRESSION: Stable lung scarring and cardiomegaly. No evidence of acute cardiopulmonary disease. Electronically Signed   By: Monte Fantasia M.D.   On: 11/13/2014 08:15   Dg Lumbar Spine Complete  11/15/2014  CLINICAL DATA:  Sickle cell pain crisis EXAM: LUMBAR SPINE - COMPLETE 4+ VIEW COMPARISON:  None. FINDINGS: Five lumbar type vertebral bodies. Straightening of the lumbar spine. No evidence of fracture or dislocation. Vertebral body heights are maintained. Mild superior/ inferior endplate changes at L5 characteristic of sickle cell. Visualized bony pelvis appears intact. IMPRESSION: No fracture or dislocation is seen.  Electronically Signed   By: Julian Hy M.D.   On: 11/15/2014 11:22   Dg Pelvis 1-2 Views  11/15/2014  CLINICAL DATA:  Sickle cell pain crisis EXAM: PELVIS - 1-2 VIEW COMPARISON:  CT right hip dated 10/27/2013 FINDINGS: Right hip arthroplasty in satisfactory position. No fracture or dislocation is seen. Left hip joint space is preserved. Visualized bony pelvis appears intact. IMPRESSION: Right hip arthroplasty satisfactory position. No fracture or dislocation is seen. Electronically Signed   By: Julian Hy M.D.   On: 11/15/2014 11:19   Ct Angio Chest W/cm &/or Wo Cm  11/30/2014  CLINICAL DATA:  Shortness of breath. EXAM: CT ANGIOGRAPHY CHEST WITH CONTRAST TECHNIQUE: Multidetector CT imaging of the chest was performed using the standard protocol during bolus administration of intravenous contrast. Multiplanar CT image reconstructions and MIPs were obtained to evaluate the vascular anatomy. CONTRAST:  160mL OMNIPAQUE IOHEXOL 350 MG/ML SOLN COMPARISON:  10/26/2014 FINDINGS: Mediastinum: Overall examination is suboptimal. Poor pulmonary arterial opacification and respiratory motion artifact diminishes exam detail. The heart size is moderately enlarged. No pericardial effusion. Prominent anterior mediastinal lymph nodes are again noted, unchanged from previous exam compatible with adenopathy. The trachea appears patent and is midline. Unremarkable appearance of the esophagus. Left upper lobe segmental pulmonary artery filling defect is identified, image 88 of series 14. Lungs/Pleura: No pleural effusion identified. There is atelectasis noted in both lung bases. No airspace consolidation. Upper Abdomen: The visualized portions of the liver are diffusely increased and attenuation. Calcified spleen noted. Musculoskeletal: Bony stigmata of sickle cell disease identified. Review of the MIP images confirms the above findings. IMPRESSION: 1. Exam detail diminished secondary to respiratory motion artifact  and poor pulmonary arterial opacification. 2. Examination is positive for at least 1 segmental branch pulmonary artery filling defect compatible with acute pulmonary embolus. 3. Chronic changes of sickle cell disease. Electronically Signed   By: Kerby Moors M.D.   On: 11/30/2014 10:28   Ct Angio Chest Pe W/cm &/or Wo Cm  11/13/2014  CLINICAL DATA:  Chest pain.  Sickle cell disease EXAM: CT ANGIOGRAPHY CHEST WITH CONTRAST TECHNIQUE: Multidetector CT imaging of the chest was performed using the standard protocol during bolus administration of intravenous contrast. Multiplanar CT image reconstructions and MIPs were obtained to evaluate the vascular anatomy. CONTRAST:  176mL OMNIPAQUE IOHEXOL 350 MG/ML SOLN COMPARISON:  Chest CT October 26, 2014 and November 13, 2014 chest radiograph FINDINGS: There is a small pulmonary embolus in the posterior segment right lower lobe pulmonary artery. No more central pulmonary emboli are identified. No evidence of right heart strain. There is no thoracic aortic aneurysm or dissection. The visualized great vessels appear normal.  There is a small area of patchy infiltrate in the anterior segment of the left upper lobe, stable. There is patchy atelectatic change in both bases, stable. No new opacity is appreciable. Cardiomegaly is stable.  The pericardium is not thickened. Visualized thyroid appears normal. Anterior mediastinal adenopathy remains, stable. The largest individual lymph node in the anterior mediastinum measures 2.3 x 1.5 cm, unchanged. There is a single prominent lymph node in the right hilum measuring 1.6 x 1.2 cm. There is no new adenopathy. Central catheter tip is near the cavoatrial junction. In the visualized upper abdomen, there is diffuse calcification of the spleen consistent with a degree of autoinfarction, stable. 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 Bones overall are sclerotic consistent with the  chronic anemia. There is a probable sebaceous cyst measuring 2.1 x 1.1 cm in the right anterior chest wall, stable. Review of the MIP images confirms the above findings. IMPRESSION: Small pulmonary embolus in a posterior segment right lower lobe pulmonary artery branch, not present recently and presumed new. No other pulmonary emboli identified. Stable cardiomegaly and anterior mediastinal adenopathy. Single prominent lymph node in the right hilum. Stable areas of patchy lower lobe atelectasis and mild patchy infiltrate in the left upper lobe. No new parenchymal lung opacity. 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 prior splenic autoinfarction. Critical Value/emergent results were called by telephone at the time of interpretation on 11/13/2014 at 12:32 pm to Dr. Joseph Berkshire , who verbally acknowledged these results. Electronically Signed   By: Lowella Grip III M.D.   On: 11/13/2014 12:34   Dg Chest Port 1 View  11/25/2014  CLINICAL DATA:  Current history of sickle cell disease, presenting with hypoxia. Recent diagnosis of pulmonary embolus. EXAM: PORTABLE CHEST 1 VIEW COMPARISON:  Chest x-ray 11/15/2014 and earlier. CTA chest 11/13/2014. CT chest 10/26/2014 and earlier. FINDINGS: Cardiac silhouette massively enlarged, unchanged. Widening of the superior mediastinum, unchanged, shown on prior CT to be due to anterior mediastinal lymphadenopathy. Stable scarring in the lung bases. Lungs otherwise clear. No localized airspace consolidation. No pleural effusions. No pneumothorax. Normal pulmonary vascularity. Left subclavian Port-A-Cath tip projects at the cavoatrial junction, unchanged. IMPRESSION: Stable massive cardiomegaly.  No acute cardiopulmonary disease. Electronically Signed   By: Evangeline Dakin M.D.   On: 11/25/2014 10:01    Scheduled Meds: . aspirin  81 mg Oral Daily  . cholecalciferol  2,000 Units Oral Daily  . enoxaparin  120 mg Subcutaneous Q24H  .  folic acid  1 mg Oral q morning - 10a  . HYDROmorphone   Intravenous 6 times per day  . hydroxyurea  500 mg Oral Daily  . ketorolac  30 mg Intravenous 4 times per day  . lisinopril  10 mg Oral Daily  . metoprolol succinate  50 mg Oral Daily  . morphine  30 mg Oral Q12H  . potassium chloride SA  20 mEq Oral q morning - 10a  . senna-docusate  1 tablet Oral BID   Continuous Infusions: . dextrose 5 % and 0.45% NaCl 100 mL/hr at 12/08/14 0814    Time spent 40 minutes.

## 2014-12-09 ENCOUNTER — Encounter (HOSPITAL_BASED_OUTPATIENT_CLINIC_OR_DEPARTMENT_OTHER): Payer: Medicare Other | Admitting: Clinical

## 2014-12-09 DIAGNOSIS — J9611 Chronic respiratory failure with hypoxia: Secondary | ICD-10-CM | POA: Diagnosis not present

## 2014-12-09 DIAGNOSIS — D5700 Hb-SS disease with crisis, unspecified: Secondary | ICD-10-CM | POA: Diagnosis not present

## 2014-12-09 DIAGNOSIS — Z659 Problem related to unspecified psychosocial circumstances: Secondary | ICD-10-CM

## 2014-12-09 DIAGNOSIS — I5022 Chronic systolic (congestive) heart failure: Secondary | ICD-10-CM | POA: Diagnosis not present

## 2014-12-09 DIAGNOSIS — I272 Other secondary pulmonary hypertension: Secondary | ICD-10-CM

## 2014-12-09 DIAGNOSIS — D638 Anemia in other chronic diseases classified elsewhere: Secondary | ICD-10-CM | POA: Diagnosis not present

## 2014-12-09 DIAGNOSIS — Z86711 Personal history of pulmonary embolism: Secondary | ICD-10-CM

## 2014-12-09 DIAGNOSIS — Z9981 Dependence on supplemental oxygen: Secondary | ICD-10-CM | POA: Diagnosis not present

## 2014-12-09 DIAGNOSIS — I110 Hypertensive heart disease with heart failure: Secondary | ICD-10-CM | POA: Diagnosis not present

## 2014-12-09 DIAGNOSIS — M79606 Pain in leg, unspecified: Secondary | ICD-10-CM | POA: Diagnosis not present

## 2014-12-09 LAB — PREPARE RBC (CROSSMATCH)

## 2014-12-09 LAB — CBC WITH DIFFERENTIAL/PLATELET
BASOS PCT: 1 %
Basophils Absolute: 0.1 10*3/uL (ref 0.0–0.1)
Eosinophils Absolute: 0.8 10*3/uL — ABNORMAL HIGH (ref 0.0–0.7)
Eosinophils Relative: 6 %
HEMATOCRIT: 16.9 % — AB (ref 39.0–52.0)
Hemoglobin: 5.9 g/dL — CL (ref 13.0–17.0)
LYMPHS ABS: 2.9 10*3/uL (ref 0.7–4.0)
Lymphocytes Relative: 21 %
MCH: 31.7 pg (ref 26.0–34.0)
MCHC: 34.9 g/dL (ref 30.0–36.0)
MCV: 90.9 fL (ref 78.0–100.0)
MONO ABS: 1.7 10*3/uL — AB (ref 0.1–1.0)
Monocytes Relative: 12 %
NEUTROS ABS: 8.4 10*3/uL — AB (ref 1.7–7.7)
Neutrophils Relative %: 60 %
PLATELETS: 401 10*3/uL — AB (ref 150–400)
RBC: 1.86 MIL/uL — ABNORMAL LOW (ref 4.22–5.81)
RDW: 18.3 % — AB (ref 11.5–15.5)
WBC: 13.9 10*3/uL — ABNORMAL HIGH (ref 4.0–10.5)

## 2014-12-09 LAB — COMPREHENSIVE METABOLIC PANEL
ALBUMIN: 3.7 g/dL (ref 3.5–5.0)
ALT: 24 U/L (ref 17–63)
AST: 43 U/L — AB (ref 15–41)
Alkaline Phosphatase: 85 U/L (ref 38–126)
Anion gap: 4 — ABNORMAL LOW (ref 5–15)
BUN: 13 mg/dL (ref 6–20)
CHLORIDE: 111 mmol/L (ref 101–111)
CO2: 24 mmol/L (ref 22–32)
CREATININE: 0.69 mg/dL (ref 0.61–1.24)
Calcium: 8.7 mg/dL — ABNORMAL LOW (ref 8.9–10.3)
GFR calc Af Amer: >60 mL/min (ref >60)
GLUCOSE: 106 mg/dL — AB (ref 65–99)
Potassium: 4.1 mmol/L (ref 3.5–5.1)
SODIUM: 139 mmol/L (ref 135–145)
Total Bilirubin: 4.3 mg/dL — ABNORMAL HIGH (ref 0.3–1.2)
Total Protein: 6.7 g/dL (ref 6.5–8.1)

## 2014-12-09 LAB — RETICULOCYTES
RBC.: 1.85 MIL/uL — ABNORMAL LOW (ref 4.22–5.81)
Retic Count, Absolute: 116.6 10*3/uL (ref 19.0–186.0)
Retic Ct Pct: 6.3 % — ABNORMAL HIGH (ref 0.4–3.1)

## 2014-12-09 IMAGING — RF DG UGI W/ SMALL BOWEL
13 of 21 series · 13 of 21 positions shown · non-contrast
Comparison: None.

CLINICAL DATA: Intermittent nausea and vomiting.  Sickle cell.

UPPER GI W/ SMALL BOWEL
TECHNIQUE: Upper GI series performed with high density barium and
effervescent agent. Thin barium also used.  Subsequently, serial
images of the small bowel were obtained including spot views of the
terminal ileum.
Fluoroscopy Time: 2.0 minutes

[Series 1: run · 1 of 1 slices shown (1 of 11)]
[im 1/1]
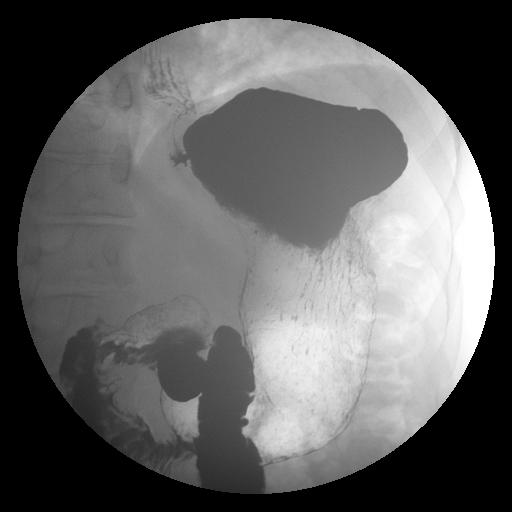

[Series 2: run · 1 of 1 slices shown (2 of 11)]
[im 1/1]
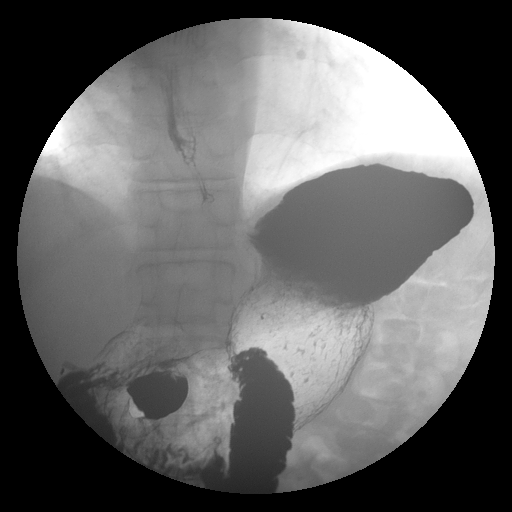

[Series 4: run · 1 of 1 slices shown (3 of 11)]
[im 1/1]
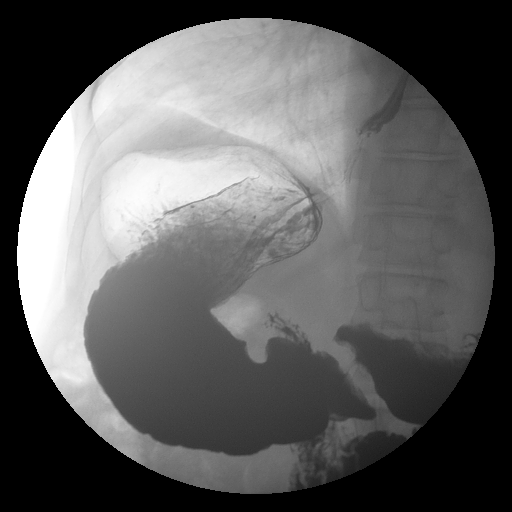

[Series 5: run · 1 of 1 slices shown (4 of 11)]
[im 1/1]
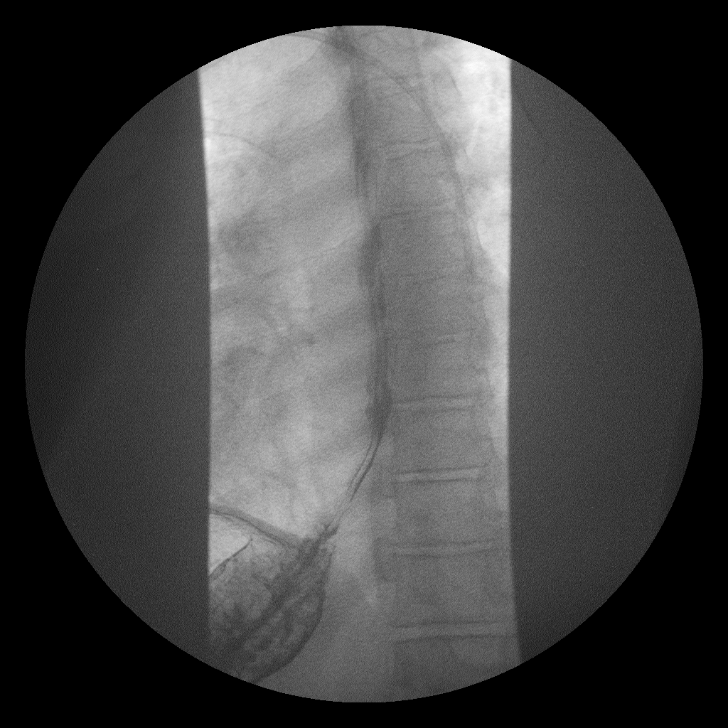

[Series 7: run · 1 of 1 slices shown (5 of 11)]
[im 1/1]
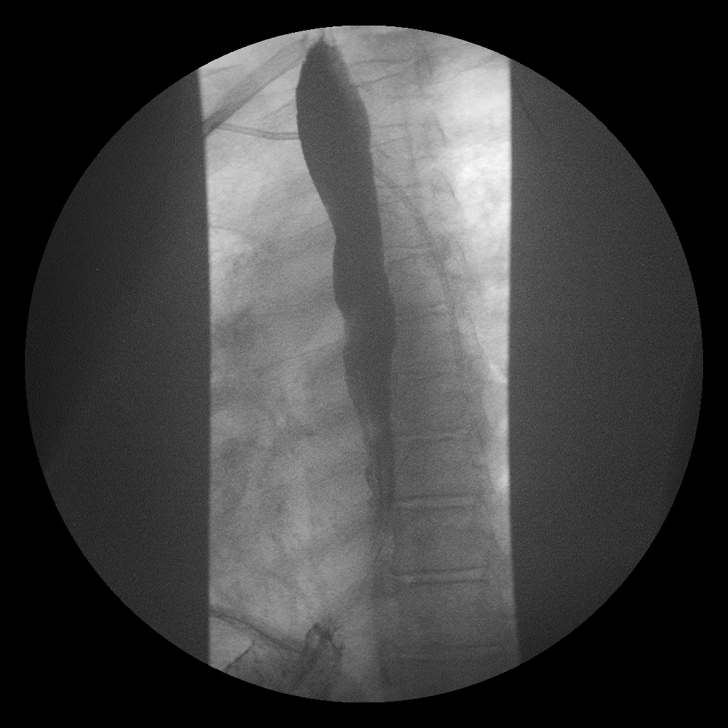

[Series 8: run · 1 of 1 slices shown (6 of 11)]
[im 1/1]
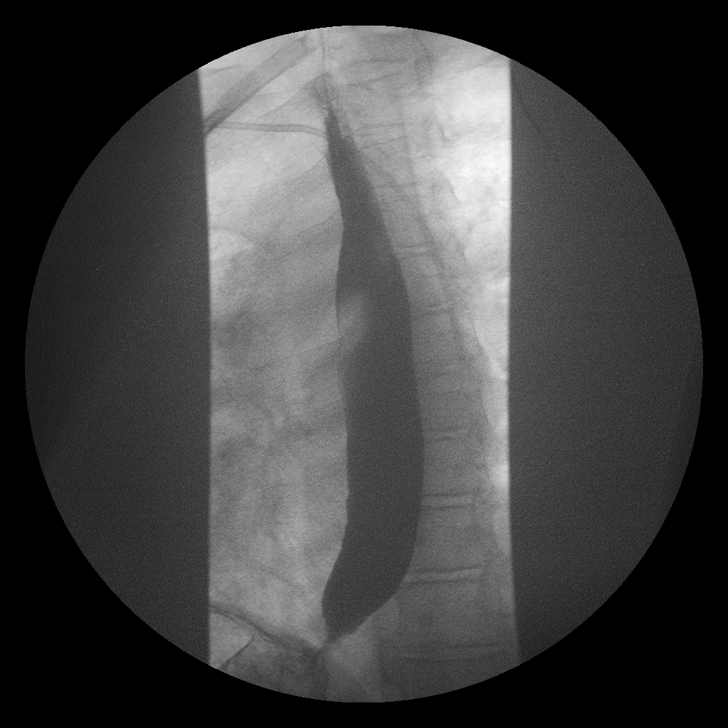

[Series 10: run · 1 of 1 slices shown (7 of 11)]
[im 1/1]
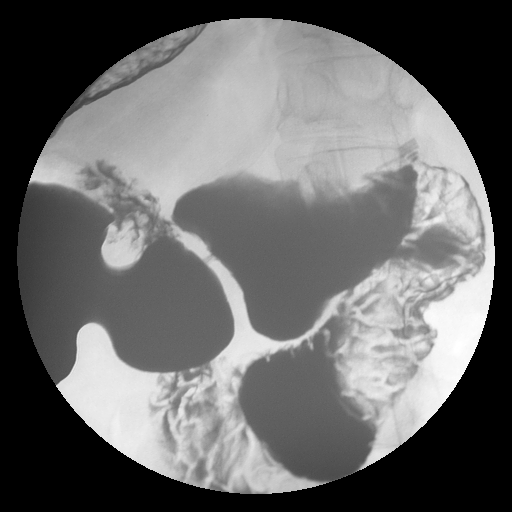

[Series 12: run · 1 of 1 slices shown (8 of 11)]
[im 1/1]
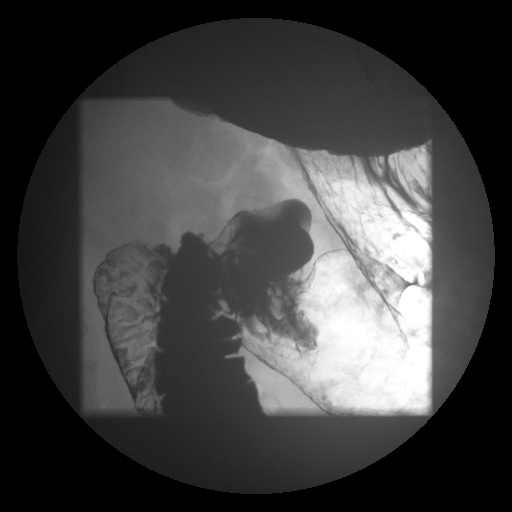

[Series 13: run · 1 of 1 slices shown (9 of 11)]
[im 1/1]
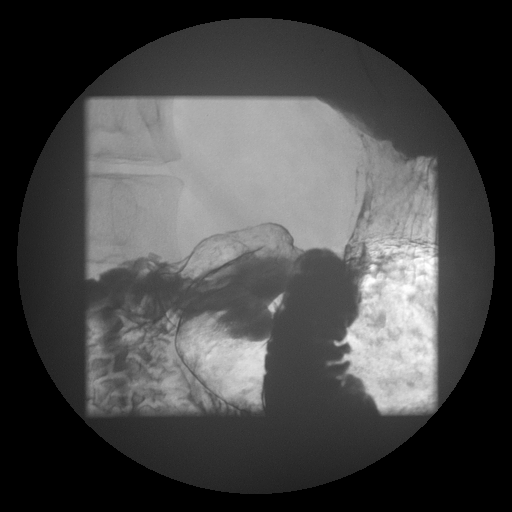

[Series 15: run · 1 of 1 slices shown (10 of 11)]
[im 1/1]
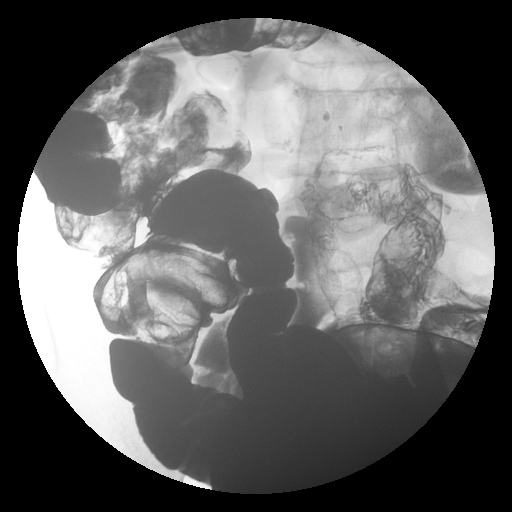

[Series 16: run · 1 of 1 slices shown (11 of 11)]
[im 1/1]
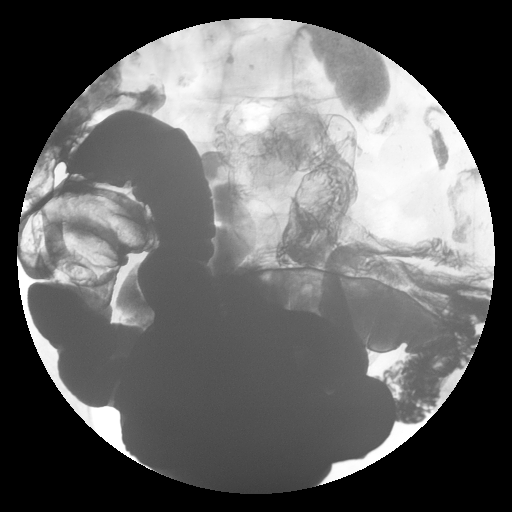

[Series 1002: view not recorded · 0.20mm/px · 1 of 1 slices shown (1 of 2)]
[im 1/1]
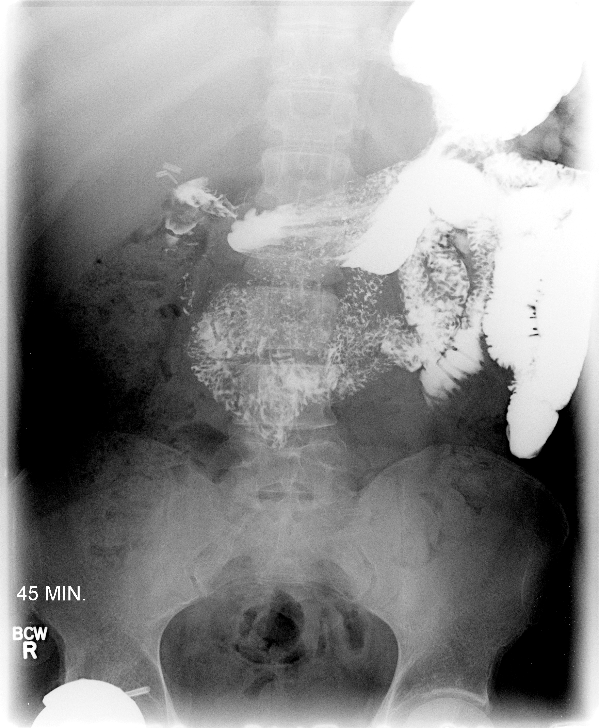

[Series 1004: view not recorded · 0.20mm/px · 1 of 1 slices shown (2 of 2)]
[im 1/1]
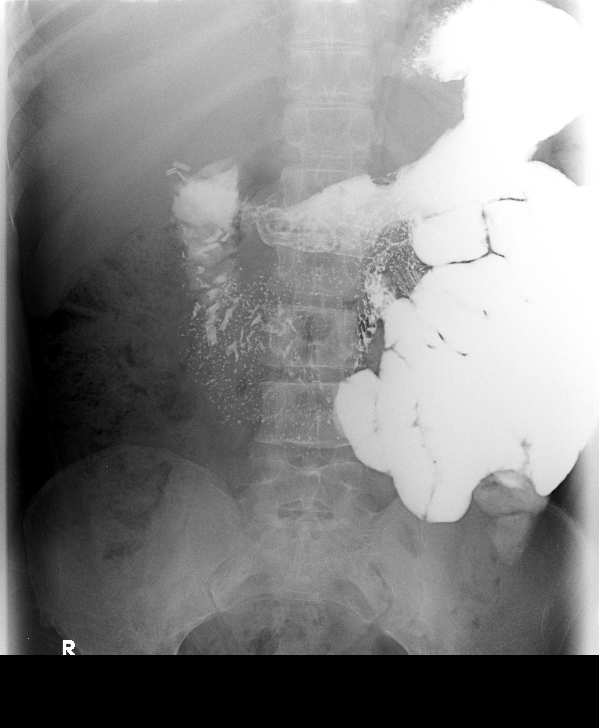

[13 of 21 positions shown; findings below may reference images not displayed]

FINDINGS: Scout view of the abdomen shows stool throughout the
colon.  Surgical clips in the right upper quadrant.  No unexpected
radiopaque calculi.  Right hip arthroplasty.

Double contrast examination of the upper gastrointestinal tract
shows normal esophageal motility.  No esophageal fold thickening,
stricture or obstruction.  Stomach and duodenal bulb are normal.

Small bowel transit time is approximately 5 hours and 15 minutes.
Small bowel fold pattern is normal.  Terminal ileum is
unremarkable.
IMPRESSION: Severe constipation.

## 2014-12-09 MED ORDER — HYDROMORPHONE HCL 4 MG PO TABS
4.0000 mg | ORAL_TABLET | ORAL | Status: DC
  Administered 2014-12-09 – 2014-12-11 (×13): 4 mg via ORAL
  Filled 2014-12-09 (×13): qty 1

## 2014-12-09 MED ORDER — SODIUM CHLORIDE 0.9 % IV SOLN
Freq: Once | INTRAVENOUS | Status: AC
  Administered 2014-12-09: 13:00:00 via INTRAVENOUS

## 2014-12-09 NOTE — Progress Notes (Signed)
PHARMACY BRIEF NOTE: HYDROXYUREA   By [facility] policy, hydroxyurea is automatically held when any of the following laboratory values occur:  ANC < 2 K  Pltc < 80K in sickle-cell patients; < 100K in other patients  Hgb <= 6 in sickle-cell patients; < 8 in other patients  Reticulocytes < 80K when Hgb < 9   Hydroxyurea has been held (discontinued from profile) per policy. Please call pharmacy [phone] with any questions.  Thanks,  Reuel Boom, PharmD, BCPS Pager: [phone] 12/09/2014, 7:20 AM

## 2014-12-09 NOTE — Progress Notes (Signed)
ASSESSMENT: Pt currently experiencing problem(s) related to psychosocial circumstances. Pt needs to f/u with PCP and [facility]; may benefit from community resources, psychoeducation and supportive counseling regarding coping with psychosocial circumstances.  Stage of Change: precontemplative  PLAN: 1. F/U with behavioral health consultant in as needed 2. Psychiatric Medications: Ambien, Vitamin D 3. Behavioral recommendation(s):   -Consider utility resources, as given in office visit -Consider applying for food stamps -Consider applying for Medicaid transportation for appointments -Consider obtaining [place] bus [place] -Consider reading educational material regarding coping with chronic pain  SUBJECTIVE: Pt. referred by Dr Doreene Burke for psychosocial problems:  Pt. reports the following symptoms/concerns: Pt states he lives alone, in 2nd floor apartment, which makes him feel safer than ground floor, even with the stairs. Pt has been on disability for 6 years, pays $500 for rent, has both Medicaid and Medicare, last received food stamps 2 years ago, has not reapplied, mostly eats out, has a friend take him to appointments. He thought someone at [facility] was helping him with utility bills, but has not heard anything in "awhile".  Duration of problem: undetermined Severity: mild  OBJECTIVE: Orientation & Cognition: Oriented x3. Thought processes normal and appropriate to situation. Mood: appropriate. Affect: appropriate Appearance: appropriate Risk of harm to self or others: no known risk of harm to self or others Substance use: none Assessments administered: psychosocial  Diagnosis: Problem related to psychosocial circumstances CPT Code: Z65.9 -------------------------------------------- Other(s) present in the room: Nurse Case Manager Eden Lathe  Time spent with patient in exam room: 20 minutes

## 2014-12-09 NOTE — Progress Notes (Signed)
SICKLE CELL SERVICE PROGRESS NOTE  [name] KGM:[id number] DOB: August 26, 1980 DOA: 12/06/2014 PCP: [name], MD  Assessment/Plan: Principal Problem:   Sickle cell pain crisis ([place]) Active Problems:   Pulmonary HTN ([place])   Anemia of chronic disease   Hx of pulmonary embolus   Essential hypertension  1. Hb SS with crisis: Pt has used 29.26 mg with 52/45:demands/deliveries. Schedule Oral Dilaudid and continue PCA as PRN use. Discontinue clinician assisted doses as we wean for discharge anticipated tomorrow. Continue Toradol. 2. Anemia of chronic disease: Hb decreased today. Would not ordinarily transfuse at this level, but in light of hypoxemia and pre-syncope wil transfuse 1 unit of RBC's. 3. Pre-Syncopal Episode: Pt again was without his Oxygen and had an episode of dizziness and subsequent trigger of crisis. I have again spoken with patient about the importance of wearing Oxygen at all times. I have also discussed that is continued decision to not use Oxygen will likely lead to an early death and maybe should consider Hospice if not willing to comply with treatment that would limit complications and decrease rate of progression of disease. 4. Leukocytosis: Pt has a chronic leukocytosis which is close to baseline currently. He has no indication of infection. 5. 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 Continue Lovenox at 120 mg daily.  6. Chronic pain: Continue MS Contin. 7. Chronic Hypoxic Respiratory Failure: Continue Oxygen 4 l/min 8. Medication non-compliance: Pt continues to be non-adherent  to Oxygen. In light of the severity of illness I have consulted palliative care consult for goals of care.   Code Status: Full Code Family Communication: N/A Disposition Plan: Anticipate discharge home tomorrow.  [name] A.  Pager [phone]. If 7PM-7AM, please contact night-coverage.  12/09/2014, 11:35 AM  LOS: 3 days     Consultants:  Palliative Care   Procedures:  None  Antibiotics:  None  HPI/Subjective: Pt rates pain as 6/10 localized to right side ribs.  Objective: Filed Vitals:   12/09/14 0400 12/09/14 0424 12/09/14 0800 12/09/14 0940  BP:  116/73  123/79  Pulse:  80  88  Temp:  98.1 F (36.7 C)  98.1 F (36.7 C)  TempSrc:  Axillary  Oral  Resp: 17 18 21 17   Height:      Weight:  164 lb 0.4 oz (74.4 kg)    SpO2: 90% 100% 98% 96%   Weight change: 0 lb (0 kg)  Intake/Output Summary (Last 24 hours) at 12/09/14 1135 Last data filed at 12/09/14 0900  Gross per 24 hour  Intake 3010.67 ml  Output   2625 ml  Net 385.67 ml    General: Alert, awake, oriented x3, in mild distress.  HEENT: [place]/AT PEERL, EOMI, mild icterus. OROPHARYNX:  Moist, No exudate/ erythema/lesions. Geographic tongue. Heart: Regular rate and rhythm, without murmurs, rubs, gallops, PMI non-displaced, no heaves or thrills on palpation.  Lungs: Clear to auscultation, no wheezing or rhonchi noted. No increased vocal fremitus resonant to percussion  Abdomen: Soft, nontender, nondistended, positive bowel sounds, no masses no hepatosplenomegaly noted.  Neuro: No focal neurological deficits noted cranial nerves II through XII grossly intact.Strength at functional baseline in bilateral upper and lower extremities. Musculoskeletal: No warm swelling or erythema around joints, no spinal tenderness noted. Psychiatric: Patient alert and oriented x3, good insight and cognition, good recent to remote recall.    Data Reviewed: Basic Metabolic Panel:  Recent Labs Lab 12/06/14 0350 12/06/14 0950 12/07/14 0518 12/08/14 0532 12/09/14 0530  NA 139  --  139 139 139  K 3.7  --  4.1 4.3 4.1  CL 108  --  111 110 111  CO2 23  --  23 23 24   GLUCOSE 123*  --  112* 110* 106*  BUN 13  --  15 15 13   CREATININE 0.93  --  0.82 0.74 0.69  CALCIUM 9.0  --  8.8* 8.9 8.7*  MG  --  1.7  --   --   --    Liver Function  Tests:  Recent Labs Lab 12/06/14 0350 12/07/14 0518 12/08/14 0532 12/09/14 0530  AST 55* 37 41 43*  ALT 26 23 24 24   ALKPHOS 87 86 86 85  BILITOT 5.7* 4.4* 4.5* 4.3*  PROT 7.4 6.9 7.3 6.7  ALBUMIN 4.0 3.9 4.3 3.7   No results for input(s): LIPASE, AMYLASE in the last 168 hours. No results for input(s): AMMONIA in the last 168 hours. CBC:  Recent Labs Lab 12/03/14 1215 12/06/14 0350 12/07/14 0518 12/08/14 0532 12/09/14 0530  WBC 10.9* 22.2* 16.9* 16.6* 13.9*  NEUTROABS 6.3 16.0* 10.4* 9.8* 8.4*  HGB 6.8* 7.9* 6.9* 6.7* 5.9*  HCT 19.1* 22.7* 19.6* 19.2* 16.9*  MCV 91.0 90.1 92.0 91.4 90.9  PLT 386 459* 435* 454* 401*   Cardiac Enzymes:  Recent Labs Lab 12/02/14 1442 12/02/14 2042 12/03/14 0315  TROPONINI 0.43* 0.44* 0.42*   BNP (last 3 results)  Recent Labs  08/29/14 0810 09/10/14 0612 11/30/14 0910  BNP 1117.0* 549.5* 639.7*    ProBNP (last 3 results)  Recent Labs  01/01/14 0909  PROBNP 5790.0*    CBG:  Recent Labs Lab 12/06/14 0920  GLUCAP 115*    No results found for this or any previous visit (from the past 240 hour(s)).   Studies: Dg Chest 2 View  12/06/2014  CLINICAL DATA:  Sickle cell crisis, 3 hours duration. Severe chest pain. EXAM: CHEST  2 VIEW COMPARISON:  11/26/2014 FINDINGS: There is a left subclavian Port-A-Cath with tip in the low SVC. There is unchanged moderate cardiomegaly. Mild central vascular prominence is unchanged. No pleural effusions. No focal airspace consolidation. No pneumothorax. IMPRESSION: Unchanged cardiomegaly. No confluent airspace consolidation or pleural effusion. Electronically Signed   By: Andreas Newport M.D.   On: 12/06/2014 04:43   Dg Chest 2 View  11/26/2014  CLINICAL DATA:  Sickle cell crisis, shortness of breath. EXAM: CHEST  2 VIEW COMPARISON:  Chest x-ray of November 25, 2014 FINDINGS: The cardiopericardial silhouette remains enlarged. The pulmonary vascularity is less engorged today. The lungs  are well-expanded. There is no focal infiltrate or pleural effusion. The power port appliance tip is stable projecting over the distal third of the SVC just proximal to the cavoatrial junction. There are stable coarse retrocardiac lung markings. There is multilevel degenerative disc disease of the thoracic spine. IMPRESSION: Improved appearance of the pulmonary vascularity and pulmonary interstitium consistent with resolution of pulmonary edema. Stable cardiomegaly. There is no evidence of pneumonia. Electronically Signed   By: David  Martinique M.D.   On: 11/26/2014 15:30   Dg Chest 2 View  11/15/2014  CLINICAL DATA:  Back pain.  Sickle cell anemia. EXAM: CHEST  2 VIEW COMPARISON:  11/13/2014 FINDINGS: Cardiomegaly. Left subclavian vein Port-A-Cath is stable. No pneumothorax. Chronic changes at the lung bases. IMPRESSION: No active cardiopulmonary disease. Electronically Signed   By: Marybelle Killings M.D.   On: 11/15/2014 08:52   Dg Chest 2 View  11/13/2014  CLINICAL DATA:  Sickle cell pain crisis with left  lateral chest wall pain EXAM: CHEST  2 VIEW COMPARISON:  10/26/2014 FINDINGS: Chronic cardiomegaly. Negative aortic and hilar contours. Stable left subclavian porta catheter placement, tip at the upper right atrium. Chronic interstitial coarsening consistent with scar. There is no edema, consolidation, effusion, or pneumothorax. No osseous findings to explain acute pain. Chronic humeral head osteonecrosis. Cholecystectomy. IMPRESSION: Stable lung scarring and cardiomegaly. No evidence of acute cardiopulmonary disease. Electronically Signed   By: Monte Fantasia M.D.   On: 11/13/2014 08:15   Dg Lumbar Spine Complete  11/15/2014  CLINICAL DATA:  Sickle cell pain crisis EXAM: LUMBAR SPINE - COMPLETE 4+ VIEW COMPARISON:  None. FINDINGS: Five lumbar type vertebral bodies. Straightening of the lumbar spine. No evidence of fracture or dislocation. Vertebral body heights are maintained. Mild superior/ inferior  endplate changes at L5 characteristic of sickle cell. Visualized bony pelvis appears intact. IMPRESSION: No fracture or dislocation is seen. Electronically Signed   By: Julian Hy M.D.   On: 11/15/2014 11:22   Dg Pelvis 1-2 Views  11/15/2014  CLINICAL DATA:  Sickle cell pain crisis EXAM: PELVIS - 1-2 VIEW COMPARISON:  CT right hip dated 10/27/2013 FINDINGS: Right hip arthroplasty in satisfactory position. No fracture or dislocation is seen. Left hip joint space is preserved. Visualized bony pelvis appears intact. IMPRESSION: Right hip arthroplasty satisfactory position. No fracture or dislocation is seen. Electronically Signed   By: Julian Hy M.D.   On: 11/15/2014 11:19   Ct Angio Chest W/cm &/or Wo Cm  11/30/2014  CLINICAL DATA:  Shortness of breath. EXAM: CT ANGIOGRAPHY CHEST WITH CONTRAST TECHNIQUE: Multidetector CT imaging of the chest was performed using the standard protocol during bolus administration of intravenous contrast. Multiplanar CT image reconstructions and MIPs were obtained to evaluate the vascular anatomy. CONTRAST:  14mL OMNIPAQUE IOHEXOL 350 MG/ML SOLN COMPARISON:  10/26/2014 FINDINGS: Mediastinum: Overall examination is suboptimal. Poor pulmonary arterial opacification and respiratory motion artifact diminishes exam detail. The heart size is moderately enlarged. No pericardial effusion. Prominent anterior mediastinal lymph nodes are again noted, unchanged from previous exam compatible with adenopathy. The trachea appears patent and is midline. Unremarkable appearance of the esophagus. Left upper lobe segmental pulmonary artery filling defect is identified, image 88 of series 14. Lungs/Pleura: No pleural effusion identified. There is atelectasis noted in both lung bases. No airspace consolidation. Upper Abdomen: The visualized portions of the liver are diffusely increased and attenuation. Calcified spleen noted. Musculoskeletal: Bony stigmata of sickle cell disease  identified. Review of the MIP images confirms the above findings. IMPRESSION: 1. Exam detail diminished secondary to respiratory motion artifact and poor pulmonary arterial opacification. 2. Examination is positive for at least 1 segmental branch pulmonary artery filling defect compatible with acute pulmonary embolus. 3. Chronic changes of sickle cell disease. Electronically Signed   By: Kerby Moors M.D.   On: 11/30/2014 10:28   Ct Angio Chest Pe W/cm &/or Wo Cm  11/13/2014  CLINICAL DATA:  Chest pain.  Sickle cell disease EXAM: CT ANGIOGRAPHY CHEST WITH CONTRAST TECHNIQUE: Multidetector CT imaging of the chest was performed using the standard protocol during bolus administration of intravenous contrast. Multiplanar CT image reconstructions and MIPs were obtained to evaluate the vascular anatomy. CONTRAST:  166mL OMNIPAQUE IOHEXOL 350 MG/ML SOLN COMPARISON:  Chest CT October 26, 2014 and November 13, 2014 chest radiograph FINDINGS: There is a small pulmonary embolus in the posterior segment right lower lobe pulmonary artery. No more central pulmonary emboli are identified. No evidence of right heart strain. There is  no thoracic aortic aneurysm or dissection. The visualized great vessels appear normal. There is a small area of patchy infiltrate in the anterior segment of the left upper lobe, stable. There is patchy atelectatic change in both bases, stable. No new opacity is appreciable. Cardiomegaly is stable.  The pericardium is not thickened. Visualized thyroid appears normal. Anterior mediastinal adenopathy remains, stable. The largest individual lymph node in the anterior mediastinum measures 2.3 x 1.5 cm, unchanged. There is a single prominent lymph node in the right hilum measuring 1.6 x 1.2 cm. There is no new adenopathy. Central catheter tip is near the cavoatrial junction. In the visualized upper abdomen, there is diffuse calcification of the spleen consistent with a degree of autoinfarction, stable.  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 Bones overall are sclerotic consistent with the chronic anemia. There is a probable sebaceous cyst measuring 2.1 x 1.1 cm in the right anterior chest wall, stable. Review of the MIP images confirms the above findings. IMPRESSION: Small pulmonary embolus in a posterior segment right lower lobe pulmonary artery branch, not present recently and presumed new. No other pulmonary emboli identified. Stable cardiomegaly and anterior mediastinal adenopathy. Single prominent lymph node in the right hilum. Stable areas of patchy lower lobe atelectasis and mild patchy infiltrate in the left upper lobe. No new parenchymal lung opacity. 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 prior splenic autoinfarction. Critical Value/emergent results were called by telephone at the time of interpretation on 11/13/2014 at 12:32 pm to Dr. Joseph Berkshire , who verbally acknowledged these results. Electronically Signed   By: Lowella Grip III M.D.   On: 11/13/2014 12:34   Dg Chest Port 1 View  11/25/2014  CLINICAL DATA:  Current history of sickle cell disease, presenting with hypoxia. Recent diagnosis of pulmonary embolus. EXAM: PORTABLE CHEST 1 VIEW COMPARISON:  Chest x-ray 11/15/2014 and earlier. CTA chest 11/13/2014. CT chest 10/26/2014 and earlier. FINDINGS: Cardiac silhouette massively enlarged, unchanged. Widening of the superior mediastinum, unchanged, shown on prior CT to be due to anterior mediastinal lymphadenopathy. Stable scarring in the lung bases. Lungs otherwise clear. No localized airspace consolidation. No pleural effusions. No pneumothorax. Normal pulmonary vascularity. Left subclavian Port-A-Cath tip projects at the cavoatrial junction, unchanged. IMPRESSION: Stable massive cardiomegaly.  No acute cardiopulmonary disease. Electronically Signed   By: Evangeline Dakin M.D.   On: 11/25/2014 10:01     Scheduled Meds: . sodium chloride   Intravenous Once  . aspirin  81 mg Oral Daily  . cholecalciferol  2,000 Units Oral Daily  . enoxaparin  120 mg Subcutaneous Q24H  . folic acid  1 mg Oral q morning - 10a  . HYDROmorphone   Intravenous 6 times per day  . HYDROmorphone  4 mg Oral Q4H  . ketorolac  30 mg Intravenous 4 times per day  . lisinopril  10 mg Oral Daily  . metoprolol succinate  50 mg Oral Daily  . morphine  30 mg Oral Q12H  . potassium chloride SA  20 mEq Oral q morning - 10a  . senna-docusate  1 tablet Oral BID   Continuous Infusions: . dextrose 5 % and 0.45% NaCl 100 mL/hr at 12/09/14 0300    Time spent 35 minutes.

## 2014-12-09 NOTE — Hospital Discharge Follow-Up (Signed)
Met with the patient along with Vesta Mixer, SW [place].  Explained the social work and case management support that can be provided to him.  He reported that he lives alone in a second floor apartment and pays approximately $500/month rent. He said that his brother can provide transportation to his medical appointments and also provided him with information about contacting DSS for medicaid transportation to medical appointments and also discussed obtaining an ID for a bus pass.  He said that he has received food stamps a few years ago but is currently not receiving any.   Discussed the importance of medical follow up in the clinic as well as the importance of using his O2 as ordered.  He spoke of his non compliance with his O2 use.   His O2 is supplied by [employer].   Provided the patient with the contact information for this CM and J. McMannes, SW.  The patient was agreeable to receiving calls from the SW/CM to check on his status.

## 2014-12-09 NOTE — Care Management Important Message (Signed)
Important Message  Patient Details  Name: AMEDEO DETWEILER MRN: [id number] Date of Birth: August 23, 1980   Medicare Important Message Given:  [facility] notification given    [name] 12/09/2014, 12:08 [place] Message  Patient Details  Name: BOBY EYER MRN: [id number] Date of Birth: 12-Oct-1980   Medicare Important Message Given:  Yes-second notification given    [name] 12/09/2014, 12:07 PM

## 2014-12-10 DIAGNOSIS — Z515 Encounter for palliative care: Secondary | ICD-10-CM | POA: Diagnosis not present

## 2014-12-10 DIAGNOSIS — J9611 Chronic respiratory failure with hypoxia: Secondary | ICD-10-CM | POA: Diagnosis not present

## 2014-12-10 DIAGNOSIS — I5022 Chronic systolic (congestive) heart failure: Secondary | ICD-10-CM | POA: Diagnosis not present

## 2014-12-10 DIAGNOSIS — M79606 Pain in leg, unspecified: Secondary | ICD-10-CM | POA: Diagnosis not present

## 2014-12-10 DIAGNOSIS — I110 Hypertensive heart disease with heart failure: Secondary | ICD-10-CM | POA: Diagnosis not present

## 2014-12-10 DIAGNOSIS — Z7189 Other specified counseling: Secondary | ICD-10-CM

## 2014-12-10 DIAGNOSIS — D5700 Hb-SS disease with crisis, unspecified: Secondary | ICD-10-CM | POA: Diagnosis not present

## 2014-12-10 DIAGNOSIS — D638 Anemia in other chronic diseases classified elsewhere: Secondary | ICD-10-CM | POA: Diagnosis not present

## 2014-12-10 DIAGNOSIS — I272 Other secondary pulmonary hypertension: Secondary | ICD-10-CM | POA: Diagnosis not present

## 2014-12-10 DIAGNOSIS — Z9981 Dependence on supplemental oxygen: Secondary | ICD-10-CM | POA: Diagnosis not present

## 2014-12-10 DIAGNOSIS — Z86711 Personal history of pulmonary embolism: Secondary | ICD-10-CM | POA: Diagnosis not present

## 2014-12-10 LAB — CBC WITH DIFFERENTIAL/PLATELET
Basophils Absolute: 0.1 10*3/uL (ref 0.0–0.1)
Basophils Relative: 1 %
Eosinophils Absolute: 0.7 10*3/uL (ref 0.0–0.7)
Eosinophils Relative: 5 %
HEMATOCRIT: 20.1 % — AB (ref 39.0–52.0)
Hemoglobin: 7 g/dL — ABNORMAL LOW (ref 13.0–17.0)
LYMPHS ABS: 2.5 10*3/uL (ref 0.7–4.0)
Lymphocytes Relative: 18 %
MCH: 31.3 pg (ref 26.0–34.0)
MCHC: 34.8 g/dL (ref 30.0–36.0)
MCV: 89.7 fL (ref 78.0–100.0)
MONOS PCT: 15 %
Monocytes Absolute: 2.2 10*3/uL — ABNORMAL HIGH (ref 0.1–1.0)
NEUTROS ABS: 8.8 10*3/uL — AB (ref 1.7–7.7)
NEUTROS PCT: 61 %
Platelets: 426 10*3/uL — ABNORMAL HIGH (ref 150–400)
RBC: 2.24 MIL/uL — AB (ref 4.22–5.81)
RDW: 17.6 % — ABNORMAL HIGH (ref 11.5–15.5)
WBC: 14.4 10*3/uL — ABNORMAL HIGH (ref 4.0–10.5)

## 2014-12-10 LAB — TYPE AND SCREEN
ABO/RH(D): O POS
Antibody Screen: NEGATIVE
UNIT DIVISION: 0

## 2014-12-10 LAB — RETICULOCYTES
RBC.: 2.24 MIL/uL — AB (ref 4.22–5.81)
Retic Count, Absolute: 170.2 10*3/uL (ref 19.0–186.0)
Retic Ct Pct: 7.6 % — ABNORMAL HIGH (ref 0.4–3.1)

## 2014-12-10 MED ORDER — HYDROMORPHONE 1 MG/ML IV SOLN
INTRAVENOUS | Status: DC
  Administered 2014-12-10: 15.6 mg via INTRAVENOUS
  Administered 2014-12-10: 5.5 mg via INTRAVENOUS
  Administered 2014-12-11: 4.5 mg via INTRAVENOUS
  Administered 2014-12-11: 7.5 mg via INTRAVENOUS
  Administered 2014-12-11: 9 mg via INTRAVENOUS
  Filled 2014-12-10: qty 25

## 2014-12-10 NOTE — Progress Notes (Signed)
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 Hb SS with crisis: Pt has used 43.2 mg with 79/72:demands/deliveries. Continue scheduled Oral Dilaudid and continue PCA as PRN use. Continue Toradol.Anticipate discharge home tomorrow. 2. Anemia of chronic disease: Hb at 7 g/dl after transfusion 1 unit rbc's. 3. Pre-Syncopal Episode: Pt again was without his Oxygen and had an episode of dizziness and subsequent trigger of crisis. I have again spoken with patient about the importance of wearing Oxygen at all times. I have also discussed that is continued decision to not use Oxygen will likely lead to an early death and maybe should consider Hospice if not willing to comply with treatment that would limit complications and decrease rate of progression of disease. 4. Cor Pulmonale: Pt has pulmonary HTN causing dilated right ventricle and moderate severe regurgitation. This constitutes compensated chronic right systolic heart failure. 5. Port-o-cath: Pt has a port-o-cath in place for use from recurrent IV therapy and blood draws. 6. Leukocytosis: Pt has a chronic leukocytosis which is close to baseline currently. He has no indication of infection. 7. 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 Continue Lovenox at 120 mg daily.  8. Chronic pain: Continue MS Contin. 9. Chronic Hypoxic Respiratory Failure: Continue Oxygen 4 l/min 10. Medication non-compliance: Pt continues to be non-adherent  to Oxygen. In light of the severity of illness He was seen by palliative care and discussed a most form.    Code Status: Full Code Family Communication: N/A Disposition Plan: Anticipate discharge home  tomorrow.  Timmie Dugue A.  Pager [phone]. If 7PM-7AM, please contact night-coverage.  12/10/2014, 2:56 PM  LOS: 4 days    Consultants:  Palliative Care   Procedures:  None  Antibiotics:  None  HPI/Subjective: Pt rates pain as 6/10 localized to right side ribs.  Objective: Filed Vitals:   12/10/14 0848 12/10/14 0949 12/10/14 1124 12/10/14 1319  BP:  121/80  132/89  Pulse:  68  72  Temp:  98.5 F (36.9 C)  98.6 F (37 C)  TempSrc:  Oral  Oral  Resp: 13 16 16 18   Height:      Weight:      SpO2: 93% 96% 95% 94%   Weight change:   Intake/Output Summary (Last 24 hours) at 12/10/14 1456 Last data filed at 12/10/14 1200  Gross per 24 hour  Intake   1125 ml  Output   2000 ml  Net   -875 ml    General: Alert, awake, oriented x3, in mild distress.  HEENT: [place]/AT PEERL, EOMI, mild icterus. OROPHARYNX:  Moist, No exudate/ erythema/lesions. Geographic tongue. Heart: Regular rate and rhythm, without murmurs, rubs, gallops, PMI non-displaced, no heaves or thrills on palpation.  Lungs: Clear to auscultation, no wheezing or rhonchi noted. No increased vocal fremitus resonant to percussion  Abdomen: Soft, nontender, nondistended, positive bowel sounds, no masses no hepatosplenomegaly noted.  Neuro: No focal neurological deficits noted cranial nerves II through XII grossly intact.Strength at functional baseline in bilateral upper and lower extremities. Musculoskeletal: No warm swelling or erythema around joints, no spinal tenderness noted. Psychiatric: Patient alert and oriented x 3.    Data Reviewed: Basic Metabolic Panel:  Recent Labs Lab 12/06/14 0350 12/06/14 0950 12/07/14 0518  12/08/14 0532 12/09/14 0530  NA 139  --  139 139 139  K 3.7  --  4.1 4.3 4.1  CL 108  --  111 110 111  CO2 23  --  23 23 24   GLUCOSE 123*  --  112* 110* 106*  BUN 13  --  15 15 13   CREATININE 0.93  --  0.82 0.74 0.69  CALCIUM 9.0  --  8.8* 8.9 8.7*  MG  --  1.7  --   --   --     Liver Function Tests:  Recent Labs Lab 12/06/14 0350 12/07/14 0518 12/08/14 0532 12/09/14 0530  AST 55* 37 41 43*  ALT 26 23 24 24   ALKPHOS 87 86 86 85  BILITOT 5.7* 4.4* 4.5* 4.3*  PROT 7.4 6.9 7.3 6.7  ALBUMIN 4.0 3.9 4.3 3.7   No results for input(s): LIPASE, AMYLASE in the last 168 hours. No results for input(s): AMMONIA in the last 168 hours. CBC:  Recent Labs Lab 12/06/14 0350 12/07/14 0518 12/08/14 0532 12/09/14 0530 12/10/14 0500  WBC 22.2* 16.9* 16.6* 13.9* 14.4*  NEUTROABS 16.0* 10.4* 9.8* 8.4* 8.8*  HGB 7.9* 6.9* 6.7* 5.9* 7.0*  HCT 22.7* 19.6* 19.2* 16.9* 20.1*  MCV 90.1 92.0 91.4 90.9 89.7  PLT 459* 435* 454* 401* 426*   Cardiac Enzymes: No results for input(s): CKTOTAL, CKMB, CKMBINDEX, TROPONINI in the last 168 hours. BNP (last 3 results)  Recent Labs  08/29/14 0810 09/10/14 0612 11/30/14 0910  BNP 1117.0* 549.5* 639.7*    ProBNP (last 3 results)  Recent Labs  01/01/14 0909  PROBNP 5790.0*    CBG:  Recent Labs Lab 12/06/14 0920  GLUCAP 115*    No results found for this or any previous visit (from the past 240 hour(s)).   Studies: Dg Chest 2 View  12/06/2014  CLINICAL DATA:  Sickle cell crisis, 3 hours duration. Severe chest pain. EXAM: CHEST  2 VIEW COMPARISON:  11/26/2014 FINDINGS: There is a left subclavian Port-A-Cath with tip in the low SVC. There is unchanged moderate cardiomegaly. Mild central vascular prominence is unchanged. No pleural effusions. No focal airspace consolidation. No pneumothorax. IMPRESSION: Unchanged cardiomegaly. No confluent airspace consolidation or pleural effusion. Electronically Signed   By: Andreas Newport M.D.   On: 12/06/2014 04:43   Dg Chest 2 View  11/26/2014  CLINICAL DATA:  Sickle cell crisis, shortness of breath. EXAM: CHEST  2 VIEW COMPARISON:  Chest x-ray of November 25, 2014 FINDINGS: The cardiopericardial silhouette remains enlarged. The pulmonary vascularity is less engorged today. The  lungs are well-expanded. There is no focal infiltrate or pleural effusion. The power port appliance tip is stable projecting over the distal third of the SVC just proximal to the cavoatrial junction. There are stable coarse retrocardiac lung markings. There is multilevel degenerative disc disease of the thoracic spine. IMPRESSION: Improved appearance of the pulmonary vascularity and pulmonary interstitium consistent with resolution of pulmonary edema. Stable cardiomegaly. There is no evidence of pneumonia. Electronically Signed   By: David  Martinique M.D.   On: 11/26/2014 15:30   Dg Chest 2 View  11/15/2014  CLINICAL DATA:  Back pain.  Sickle cell anemia. EXAM: CHEST  2 VIEW COMPARISON:  11/13/2014 FINDINGS: Cardiomegaly. Left subclavian vein Port-A-Cath is stable. No pneumothorax. Chronic changes at the lung bases. IMPRESSION: No active cardiopulmonary disease. Electronically Signed   By: Marybelle Killings M.D.   On: 11/15/2014 08:52   Dg Chest 2 View  11/13/2014  CLINICAL DATA:  Sickle cell pain crisis with left lateral chest wall pain EXAM: CHEST  2 VIEW COMPARISON:  10/26/2014 FINDINGS: Chronic cardiomegaly. Negative aortic and hilar contours. Stable left subclavian porta catheter placement, tip at the upper right atrium. Chronic interstitial coarsening consistent with scar. There is no edema, consolidation, effusion, or pneumothorax. No osseous findings to explain acute pain. Chronic humeral head osteonecrosis. Cholecystectomy. IMPRESSION: Stable lung scarring and cardiomegaly. No evidence of acute cardiopulmonary disease. Electronically Signed   By: Monte Fantasia M.D.   On: 11/13/2014 08:15   Dg Lumbar Spine Complete  11/15/2014  CLINICAL DATA:  Sickle cell pain crisis EXAM: LUMBAR SPINE - COMPLETE 4+ VIEW COMPARISON:  None. FINDINGS: Five lumbar type vertebral bodies. Straightening of the lumbar spine. No evidence of fracture or dislocation. Vertebral body heights are maintained. Mild superior/ inferior  endplate changes at L5 characteristic of sickle cell. Visualized bony pelvis appears intact. IMPRESSION: No fracture or dislocation is seen. Electronically Signed   By: Julian Hy M.D.   On: 11/15/2014 11:22   Dg Pelvis 1-2 Views  11/15/2014  CLINICAL DATA:  Sickle cell pain crisis EXAM: PELVIS - 1-2 VIEW COMPARISON:  CT right hip dated 10/27/2013 FINDINGS: Right hip arthroplasty in satisfactory position. No fracture or dislocation is seen. Left hip joint space is preserved. Visualized bony pelvis appears intact. IMPRESSION: Right hip arthroplasty satisfactory position. No fracture or dislocation is seen. Electronically Signed   By: Julian Hy M.D.   On: 11/15/2014 11:19   Ct Angio Chest W/cm &/or Wo Cm  11/30/2014  CLINICAL DATA:  Shortness of breath. EXAM: CT ANGIOGRAPHY CHEST WITH CONTRAST TECHNIQUE: Multidetector CT imaging of the chest was performed using the standard protocol during bolus administration of intravenous contrast. Multiplanar CT image reconstructions and MIPs were obtained to evaluate the vascular anatomy. CONTRAST:  132mL OMNIPAQUE IOHEXOL 350 MG/ML SOLN COMPARISON:  10/26/2014 FINDINGS: Mediastinum: Overall examination is suboptimal. Poor pulmonary arterial opacification and respiratory motion artifact diminishes exam detail. The heart size is moderately enlarged. No pericardial effusion. Prominent anterior mediastinal lymph nodes are again noted, unchanged from previous exam compatible with adenopathy. The trachea appears patent and is midline. Unremarkable appearance of the esophagus. Left upper lobe segmental pulmonary artery filling defect is identified, image 88 of series 14. Lungs/Pleura: No pleural effusion identified. There is atelectasis noted in both lung bases. No airspace consolidation. Upper Abdomen: The visualized portions of the liver are diffusely increased and attenuation. Calcified spleen noted. Musculoskeletal: Bony stigmata of sickle cell disease  identified. Review of the MIP images confirms the above findings. IMPRESSION: 1. Exam detail diminished secondary to respiratory motion artifact and poor pulmonary arterial opacification. 2. Examination is positive for at least 1 segmental branch pulmonary artery filling defect compatible with acute pulmonary embolus. 3. Chronic changes of sickle cell disease. Electronically Signed   By: Kerby Moors M.D.   On: 11/30/2014 10:28   Ct Angio Chest Pe W/cm &/or Wo Cm  11/13/2014  CLINICAL DATA:  Chest pain.  Sickle cell disease EXAM: CT ANGIOGRAPHY CHEST WITH CONTRAST TECHNIQUE: Multidetector CT imaging of the chest was performed using the standard protocol during bolus administration of intravenous contrast. Multiplanar CT image reconstructions and MIPs were obtained to evaluate the vascular anatomy. CONTRAST:  172mL OMNIPAQUE IOHEXOL 350 MG/ML SOLN COMPARISON:  Chest CT October 26, 2014 and November 13, 2014 chest radiograph FINDINGS: There is a small pulmonary embolus in the posterior segment right lower lobe pulmonary artery. No more central pulmonary emboli are identified. No evidence  of right heart strain. There is no thoracic aortic aneurysm or dissection. The visualized great vessels appear normal. There is a small area of patchy infiltrate in the anterior segment of the left upper lobe, stable. There is patchy atelectatic change in both bases, stable. No new opacity is appreciable. Cardiomegaly is stable.  The pericardium is not thickened. Visualized thyroid appears normal. Anterior mediastinal adenopathy remains, stable. The largest individual lymph node in the anterior mediastinum measures 2.3 x 1.5 cm, unchanged. There is a single prominent lymph node in the right hilum measuring 1.6 x 1.2 cm. There is no new adenopathy. Central catheter tip is near the cavoatrial junction. In the visualized upper abdomen, there is diffuse calcification of the spleen consistent with a degree of autoinfarction, stable.  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 Bones overall are sclerotic consistent with the chronic anemia. There is a probable sebaceous cyst measuring 2.1 x 1.1 cm in the right anterior chest wall, stable. Review of the MIP images confirms the above findings. IMPRESSION: Small pulmonary embolus in a posterior segment right lower lobe pulmonary artery branch, not present recently and presumed new. No other pulmonary emboli identified. Stable cardiomegaly and anterior mediastinal adenopathy. Single prominent lymph node in the right hilum. Stable areas of patchy lower lobe atelectasis and mild patchy infiltrate in the left upper lobe. No new parenchymal lung opacity. 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 prior splenic autoinfarction. Critical Value/emergent results were called by telephone at the time of interpretation on 11/13/2014 at 12:32 pm to Dr. Joseph Berkshire , who verbally acknowledged these results. Electronically Signed   By: Lowella Grip III M.D.   On: 11/13/2014 12:34   Dg Chest Port 1 View  11/25/2014  CLINICAL DATA:  Current history of sickle cell disease, presenting with hypoxia. Recent diagnosis of pulmonary embolus. EXAM: PORTABLE CHEST 1 VIEW COMPARISON:  Chest x-ray 11/15/2014 and earlier. CTA chest 11/13/2014. CT chest 10/26/2014 and earlier. FINDINGS: Cardiac silhouette massively enlarged, unchanged. Widening of the superior mediastinum, unchanged, shown on prior CT to be due to anterior mediastinal lymphadenopathy. Stable scarring in the lung bases. Lungs otherwise clear. No localized airspace consolidation. No pleural effusions. No pneumothorax. Normal pulmonary vascularity. Left subclavian Port-A-Cath tip projects at the cavoatrial junction, unchanged. IMPRESSION: Stable massive cardiomegaly.  No acute cardiopulmonary disease. Electronically Signed   By: Evangeline Dakin M.D.   On: 11/25/2014 10:01     Scheduled Meds: . aspirin  81 mg Oral Daily  . cholecalciferol  2,000 Units Oral Daily  . enoxaparin  120 mg Subcutaneous Q24H  . folic acid  1 mg Oral q morning - 10a  . HYDROmorphone   Intravenous 6 times per day  . HYDROmorphone  4 mg Oral Q4H  . ketorolac  30 mg Intravenous 4 times per day  . lisinopril  10 mg Oral Daily  . metoprolol succinate  50 mg Oral Daily  . morphine  30 mg Oral Q12H  . potassium chloride SA  20 mEq Oral q morning - 10a  . senna-docusate  1 tablet Oral BID   Continuous Infusions: . dextrose 5 % and 0.45% NaCl 100 mL/hr at 12/09/14 0300    Time spent 40 minutes.

## 2014-12-10 NOTE — Consult Note (Signed)
Consultation Note Date: 12/10/2014   Patient Name: [name]  DOB: 11-11-1980  MRN: [id number]  Age / Sex: 34 y.o., male  PCP: Tresa Garter, MD Referring Physician: [name], MD  Reason for Consultation: Establishing goals of care  Clinical Assessment/Narrative: 34 year old male with sickle cell and related complications of chronic respiratory failure and cor pulmonale admitted for crisis. He has been having periods of syncope and recurrent crises due to not wearing his oxygen 24 hours per day as prescribed.  Palliative consulted for goals of care.  I met with patient who reports that his family and his work are the most important things to him.    He reports that his physicians have been doing a good job explaining things to him.  He is able to verbalize that he has a chronic illness that continues to worsen.  We spent a great deal of time discussing the emotional difficulty of being chronically debilitated at such a young age.  He reports seeing significant decline in his functional status over the past decade.  He reports being very compliant wearing his oxygen at home, but he admits that he does not wear it when he leaves hi house, including to his work.  He states that he hates having to explain to everyone why he requires oxygen.  He reports being very isolated and not spending time with his friends.  He reports that his friends "like to party" but he is too exhausted to even leave the house once he gets home.  Contacts/Participants in Discussion: Patient Primary Decision Maker: Patient Relationship to Patient self HCPOA: No. Reports that his mother would be surrogate decision maker, but he has never discussed his wishes if he would become seriously ill  SUMMARY OF RECOMMENDATIONS - He reports being told by multiple physicians that he is seriously ill and that he has a limited life that  will be significantly shorter if he does not wear his oxygen continuously. - While he is able to verbalize this, it seems as though he is still in denial regarding the seriousness of his condition. - He reports that he wears his oxygen at home continuously, but he "cant see" wearing it constantly, such as to work or when he leaves the house. - We discussed naming a healthcare surrogate, and he will consider creating a HCPOA document.  He reports that he would want his mom to make decisions on his behalf. - We reviewed a MOST form.  He reports needing time to review it with his mother prior to completing any documents.   - He reports wanting to continue with any therapies that are likely to add time to his life.  He remains full code with plan for all aggressive measures.   Code Status/Advance Care Planning: Full code    Code Status Orders        Start     Ordered   12/06/14 0851  Full code   Continuous     12/06/14 0850     Other Directives:None  Symptom Management:   Pain management per primary  Psycho-social/Spiritual:  Support System: [place]. He reports relying on family, but he endorses that he is very distant with his friends due to his chronic disease. Desire for further Chaplaincy support:No  Prognosis: Unable to determine as this will depend on his compliance moving forward  Discharge Planning: Home   Chief Complaint/ Primary Diagnoses: Present on Admission:  . Sickle cell pain crisis ([place]) . Anemia  of chronic disease . Pulmonary HTN ([place]) . Hx of pulmonary embolus . Essential hypertension  I have reviewed the medical record, interviewed the patient and family, and examined the patient. The following aspects are pertinent.  Past Medical History  Diagnosis Date  . Sickle cell anemia (HCC)   . Blood transfusion   . Acute embolism and thrombosis of right internal jugular vein ([place])   . Hypokalemia   . Mood disorder ([place])   . History of pulmonary embolus (PE)     . Avascular necrosis (HCC)     Right Hip  . Leukocytosis     Chronic  . Thrombocytosis (HCC)     Chronic  . Hypertension   . History of Clostridium difficile infection   . Uses marijuana   . Chronic anticoagulation   . Functional asplenia   . Former smoker   . Second hand tobacco smoke exposure   . Alcohol consumption of one to four drinks per day   . Noncompliance with medication regimen   . Sickle-cell crisis with associated acute chest syndrome ([place]) 05/13/2013  . Acute chest syndrome ([place]) 06/18/2013  . Demand ischemia ([place]) 01/02/2014  . Pulmonary hypertension ([place])    Social History   Social History  . Marital Status: Single    Spouse Name: N/A  . Number of Children: 0  . Years of Education: 13   Occupational History  . 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 Smoking status: Former Smoker -- 13 years  . Smokeless tobacco: Never Used  . Alcohol Use: No  . Drug Use: 2.00 per week    Special: Marijuana     Comment: Marijuana weekly  . Sexual Activity:    Partners: Female    Museum/gallery curator: None     Comment: month ago   Other Topics Concern  . None   Social History Narrative   Lives in an apartment.  Single.  Lives alone but has a girlfriend that helps care for him.  Does not use any assist devices.        [name]:  [phone] Mom, emergency contact      [facility]:   Patient continuing to live in her apartment in town alone. 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   Family History  Problem Relation Age of Onset  . Sickle cell trait Mother   . Depression Mother   . Diabetes Mother   . Sickle cell trait Father   . Sickle cell trait Brother    Scheduled Meds: . aspirin  81 mg Oral Daily  . cholecalciferol  2,000 Units Oral Daily  . enoxaparin  120 mg Subcutaneous Q24H  . folic acid  1 mg Oral q morning - 10a  . HYDROmorphone   Intravenous 6 times per day  . HYDROmorphone   4 mg Oral Q4H  . ketorolac  30 mg Intravenous 4 times per day  . lisinopril  10 mg Oral Daily  . metoprolol succinate  50 mg Oral Daily  . morphine  30 mg Oral Q12H  . potassium chloride SA  20 mEq Oral q morning - 10a  . senna-docusate  1 tablet Oral BID   Continuous Infusions: . dextrose 5 % and 0.45% NaCl 100 mL/hr at 12/10/14 1952   PRN Meds:.diphenhydrAMINE, naloxone **AND** sodium chloride, ondansetron (ZOFRAN) IV, polyethylene glycol, zolpidem Medications Prior to Admission:  Prior to Admission medications  Medication Sig Start Date End Date Taking? Authorizing Provider  aspirin 81 MG chewable tablet Chew 1 tablet (81 mg total) by mouth daily. 07/23/14  Yes [name], MD  Cholecalciferol (VITAMIN D) 2000 UNITS tablet Take 1 tablet (2,000 Units total) by mouth daily. 02/13/14  Yes [name], MD  enoxaparin (LOVENOX) 120 MG/0.8ML injection Inject 0.8 mLs (120 mg total) into the skin daily. 12/03/14  Yes [name], MD  folic acid (FOLVITE) 1 MG tablet Take 1 tablet (1 mg total) by mouth every morning. 10/08/13  Yes [name], MD  HYDROmorphone (DILAUDID) 4 MG tablet Take 1 tablet (4 mg total) by mouth every 4 (four) hours as needed for severe pain. 12/02/14  Yes Tresa Garter, MD  hydroxyurea (HYDREA) 500 MG capsule Take 1 capsule (500 mg total) by mouth daily. May take with food to minimize GI side effects. 11/06/14  Yes Dorena Dew, FNP  lisinopril (PRINIVIL,ZESTRIL) 10 MG tablet Take 1 tablet (10 mg total) by mouth daily. 01/22/14  Yes [name], MD  metoprolol succinate (TOPROL-XL) 50 MG 24 hr tablet Take 1 tablet (50 mg total) by mouth daily. 11/28/14  Yes [name], MD  morphine (MS CONTIN) 30 MG 12 hr tablet Take 1 tablet (30 mg total) by mouth every 12 (twelve) hours. 11/18/14  Yes Tresa Garter, MD  potassium chloride SA (K-DUR,KLOR-CON) 20 MEQ tablet Take 1 tablet (20 mEq total) by mouth every morning. 06/09/14   Yes [name], MD  zolpidem (AMBIEN) 10 MG tablet Take 1 tablet (10 mg total) by mouth at bedtime as needed for sleep. 10/23/14  Yes Tresa Garter, MD   No Known Allergies  Review of Systems  Constitutional: Positive for activity change, appetite change and fatigue.  Cardiovascular: Positive for chest pain.  Musculoskeletal: Positive for back pain.    Physical Exam  Constitutional: He appears well-developed and well-nourished. No distress.  HENT:  Head: Normocephalic and atraumatic.  Eyes: Right eye exhibits no discharge. Left eye exhibits no discharge. No scleral icterus.  Neck: No tracheal deviation present. No thyromegaly present.  Cardiovascular: Normal rate and regular rhythm.   No murmur heard. Respiratory: No respiratory distress. He has no wheezes.  GI: He exhibits no distension.  Musculoskeletal: He exhibits no edema.  Neurological: No cranial nerve deficit.  Skin: Skin is warm and dry.  Psychiatric: Thought content normal.    Vital Signs: BP 113/76 mmHg  Pulse 74  Temp(Src) 98.3 F (36.8 C) (Oral)  Resp 13  Ht 6' (1.829 m)  Wt 74.4 kg (164 lb 0.4 oz)  BMI 22.24 kg/m2  SpO2 94%  SpO2: SpO2: 94 % O2 Device:SpO2: 94 % O2 Flow Rate: .O2 Flow Rate (L/min): 4 L/min  IO: Intake/output summary:  Intake/Output Summary (Last 24 hours) at 12/10/14 2245 Last data filed at 12/10/14 2056  Gross per 24 hour  Intake    840 ml  Output   2200 ml  Net  -1360 ml    LBM: Last BM Date: 12/10/14 Baseline Weight: Weight: 72.122 kg (159 lb) Most recent weight: Weight: 74.4 kg (164 lb 0.4 oz)      Palliative Assessment/Data:  Flowsheet Rows        Most Recent Value   Intake Tab    Referral Department  Hospitalist   Unit at Time of Referral  Med/Surg Unit   Palliative Care Primary Diagnosis  Cardiac [Sickle cell]   Date Notified  12/09/14  Palliative Care Type  New Palliative care   Reason for referral  Clarify Goals of Care   Date of Admission   12/06/14   Date first seen by Palliative Care  12/10/14   # of days Palliative referral response time  1 Day(s)   # of days IP prior to Palliative referral  3   Clinical Assessment    Palliative Performance Scale Score  80%   Psychosocial & Spiritual Assessment    Palliative Care Outcomes    Patient/Family meeting held?  No      Additional Data Reviewed:  CBC:    Component Value Date/Time   WBC 14.4* 12/10/2014 0500   HGB 7.0* 12/10/2014 0500   HCT 20.1* 12/10/2014 0500   PLT 426* 12/10/2014 0500   MCV 89.7 12/10/2014 0500   NEUTROABS 8.8* 12/10/2014 0500   LYMPHSABS 2.5 12/10/2014 0500   MONOABS 2.2* 12/10/2014 0500   EOSABS 0.7 12/10/2014 0500   BASOSABS 0.1 12/10/2014 0500   Comprehensive Metabolic Panel:    Component Value Date/Time   NA 139 12/09/2014 0530   K 4.1 12/09/2014 0530   CL 111 12/09/2014 0530   CO2 24 12/09/2014 0530   BUN 13 12/09/2014 0530   CREATININE 0.69 12/09/2014 0530   CREATININE 0.64 08/22/2013 1235   GLUCOSE 106* 12/09/2014 0530   CALCIUM 8.7* 12/09/2014 0530   AST 43* 12/09/2014 0530   ALT 24 12/09/2014 0530   ALKPHOS 85 12/09/2014 0530   BILITOT 4.3* 12/09/2014 0530   PROT 6.7 12/09/2014 0530   ALBUMIN 3.7 12/09/2014 0530     Time In: 1220 Time Out: 1320 Time Total: 60 Greater than 50%  of this time was spent counseling and coordinating care related to the above assessment and plan.  Signed by: Micheline Rough, MD  Micheline Rough, MD  12/10/2014, 10:45 PM  Please contact Palliative Medicine Team phone at [phone] for questions and concerns.

## 2014-12-10 NOTE — Progress Notes (Signed)
[facility]  Patient Status: New  [facility] is providing the following services: RN - [facility] was never able to make contact with the patient for his first skilled nursing visit in the home after his previous discharge.   If patient discharges after hours, please call [phone].   Lurlean Leyden 12/10/2014, 10:24 AM

## 2014-12-10 NOTE — Clinical Documentation Improvement (Signed)
Internal Medicine  Prior surgical history grid notes placement and removal of port-a-cath.  CXR 12/06/2014 notes left subclavian port-a-cath with tip in the low SVC; also notes unchanged cardiomegaly.  Please document in your future progress notes and discharge summary if you agree with presence of port-a-cath and cardiomegaly.    Please exercise your independent, professional judgment when responding. A specific answer is not anticipated or expected.   Thank you, Mateo Flow, RN [phone] Clinical Documentation Specialist

## 2014-12-11 DIAGNOSIS — I10 Essential (primary) hypertension: Secondary | ICD-10-CM

## 2014-12-11 DIAGNOSIS — I272 Other secondary pulmonary hypertension: Secondary | ICD-10-CM | POA: Diagnosis not present

## 2014-12-11 DIAGNOSIS — D638 Anemia in other chronic diseases classified elsewhere: Secondary | ICD-10-CM | POA: Diagnosis not present

## 2014-12-11 DIAGNOSIS — D5700 Hb-SS disease with crisis, unspecified: Secondary | ICD-10-CM | POA: Diagnosis not present

## 2014-12-11 MED ORDER — HEPARIN SOD (PORK) LOCK FLUSH 100 UNIT/ML IV SOLN
500.0000 [IU] | Freq: Once | INTRAVENOUS | Status: AC
  Administered 2014-12-11: 500 [IU] via INTRAVENOUS

## 2014-12-11 NOTE — Progress Notes (Signed)
Ambulating in hall with 4L [name] patient O2 saturation decreased to 77% - average 86-87% on 4L [place] ambulating. Return to bed O2 sat increased to 89% - increased to 5L with sat 91%.  Will notify MD.

## 2014-12-11 NOTE — Discharge Summary (Signed)
[name] MRN: [id number] DOB/AGE: 1980-11-30 34 y.o.  Admit date: 12/06/2014 Discharge date: 12/11/2014  Primary Care Physician:  [name], MD   Discharge Diagnoses:   Patient Active Problem List   Diagnosis Date Noted  . Atrial fibrillation with RVR ([place]) 08/29/2014    Priority: High  . Cor pulmonale, chronic ([place]) 08/25/2014    Priority: High  . Pulmonary HTN ([place]) 06/18/2013    Priority: High  . Functional asplenia     Priority: High  . Anemia of chronic disease 06/25/2014    Priority: Medium  . Hypoxia 03/14/2014    Priority: Medium  . Hb-SS disease with crisis ([place]) 01/22/2014    Priority: Medium  . Chronic anticoagulation 08/22/2013    Priority: Medium  . Avascular necrosis ([place])     Priority: Low  . Pulmonary emboli ([place]) 11/30/2014  . Pulmonary embolus ([place]) 11/30/2014  . Syncope 11/30/2014  . Sickle cell pain crisis ([place]) 11/23/2014  . Chest pain   . Hb-SS disease without crisis ([employer]) 10/07/2014  . Acute respiratory failure with hypoxia ([place])   . Acute chest syndrome in sickle crisis ([place])   . PVC's (premature ventricular contractions) 09/10/2014  . Abnormal EKG 09/10/2014  . Hypoxemia   . Chronic atrial fibrillation ([place]) 09/09/2014  . Sickle cell crisis ([place]) 09/08/2014  . Bacteremia   . Elevated troponin I level 08/29/2014  . Ankle edema 07/07/2014  . Sickle cell anemia ([place]) 06/25/2014  . PAH (pulmonary artery hypertension) ([place]) 03/18/2014  . Paralytic strabismus, external ophthalmoplegia   . Chronic pain syndrome 12/12/2013  . Essential hypertension 08/22/2013  . Vitamin D deficiency 02/13/2013  . Uses marijuana 01/13/2013  . Embolism, pulmonary with infarction ([place]) 07/09/2012  . Hx of pulmonary embolus 06/29/2012  . Hemochromatosis 12/14/2011    DISCHARGE MEDICATION:   Medication List    TAKE these medications        aspirin 81 MG chewable tablet  Chew 1 tablet (81 mg total) by mouth daily.     enoxaparin 120  MG/0.8ML injection  Commonly known as:  LOVENOX  Inject 0.8 mLs (120 mg total) into the skin daily.     folic acid 1 MG tablet  Commonly known as:  FOLVITE  Take 1 tablet (1 mg total) by mouth every morning.     HYDROmorphone 4 MG tablet  Commonly known as:  DILAUDID  Take 1 tablet (4 mg total) by mouth every 4 (four) hours as needed for severe pain.     hydroxyurea 500 MG capsule  Commonly known as:  HYDREA  Take 1 capsule (500 mg total) by mouth daily. May take with food to minimize GI side effects.     lisinopril 10 MG tablet  Commonly known as:  PRINIVIL,ZESTRIL  Take 1 tablet (10 mg total) by mouth daily.     metoprolol succinate 50 MG 24 hr tablet  Commonly known as:  TOPROL-XL  Take 1 tablet (50 mg total) by mouth daily.     morphine 30 MG 12 hr tablet  Commonly known as:  MS CONTIN  Take 1 tablet (30 mg total) by mouth every 12 (twelve) hours.     potassium chloride SA 20 MEQ tablet  Commonly known as:  K-DUR,KLOR-CON  Take 1 tablet (20 mEq total) by mouth every morning.     Vitamin D 2000 UNITS tablet  Take 1 tablet (2,000 Units total) by mouth daily.     zolpidem 10 MG tablet  Commonly known as:  AMBIEN  Take 1 tablet (10 mg total) by mouth at bedtime as needed for sleep.          Consults: Treatment Team:  Palliative [facility]   SIGNIFICANT DIAGNOSTIC STUDIES:  Dg Chest 2 View  12/06/2014  CLINICAL DATA:  Sickle cell crisis, 3 hours duration. Severe chest pain. EXAM: CHEST  2 VIEW COMPARISON:  11/26/2014 FINDINGS: There is a left subclavian Port-A-Cath with tip in the low SVC. There is unchanged moderate cardiomegaly. Mild central vascular prominence is unchanged. No pleural effusions. No focal airspace consolidation. No pneumothorax. IMPRESSION: Unchanged cardiomegaly. No confluent airspace consolidation or pleural effusion. Electronically Signed   By: Andreas Newport M.D.   On: 12/06/2014 04:43   Dg Chest 2 View  11/26/2014  CLINICAL DATA:  Sickle  cell crisis, shortness of breath. EXAM: CHEST  2 VIEW COMPARISON:  Chest x-ray of November 25, 2014 FINDINGS: The cardiopericardial silhouette remains enlarged. The pulmonary vascularity is less engorged today. The lungs are well-expanded. There is no focal infiltrate or pleural effusion. The power port appliance tip is stable projecting over the distal third of the SVC just proximal to the cavoatrial junction. There are stable coarse retrocardiac lung markings. There is multilevel degenerative disc disease of the thoracic spine. IMPRESSION: Improved appearance of the pulmonary vascularity and pulmonary interstitium consistent with resolution of pulmonary edema. Stable cardiomegaly. There is no evidence of pneumonia. Electronically Signed   By: David  Martinique M.D.   On: 11/26/2014 15:30      No results found for this or any previous visit (from the past 240 hour(s)).  BRIEF ADMITTING H & P: Patient admitted with 3 days of pain not relieved by his home medications. Patient also felt dizzy while at work. He was discharged about a week ago and since discharge he has apparently had no appetite. He has not been eating or drinking adequately. He went to work today and felt dizzy almost passed out. His blood sugar was on the low side. Since admission however he has gained his appetite. His pain is at 10 out of 10 when arrival at the ER. He received IV Dilaudid in the ER of to 8 mg but no relief. He is therefore being admitted for further management. Denied any shortness of breath, no cough no nausea vomiting or diarrhea. Pain is located in his back and lower legs. Also chest wall.    Hospital Course:  Present on Admission:  . Sickle cell pain crisis [facility]): Pt's pain was managed with Dilaudid via PCA, Toradol and IVF. His MS Contin was continued in management of his chronic pain. As his pain decreased he was transitioned to oral analgesics and is discharged home on his usual pre-hospitalization medications.  .  Pre-syncopal Episode: This was felt to be secondary to hypoxia as patient was not wearing Oxygen as recommended around the clock. Pt advised to use Oxygen at all times.   . Anemia of chronic disease: Pt had a Hb as low as 5.9 in a setting of increased Hypoxemia. He was transfsed 1 unit RBC's and at the time of discharge his Hb was 7 g/dl.   . Pulmonary HTN (HCC)/Cor Pulmonale: Pt has existing Pulmonary HTN leading to chronic right sided systolic heart disease. He has not been compliant with use of Oxygen.   . Chronic Respiratory failure with Hypoxia: Pt to use 5 l/min of Oxygen all the time and PMD to taper down to 3-4 l/min as circulating blood volume improves.  Marland Kitchen Hx of pulmonary embolus/Chronic  Anticoagulation: Pt has recurrent emboli: Continue Lovenox 120 mg daily.  . Essential hypertension: BP well controlled on current regimen.  . Port-o-cath: Pt has port-o-cath in place for recurrent IV therapy and Phlebotomy.   . Prognosis: Pt has a poor prognosis in light of his Cor-pulmonale and non-adherence to therapy. Pt was seen in consult by palliative care to establish goals of care and options for symptoms management. A MOST form was initiated bu patient to speak with his mother before he completes the form.    Disposition and Follow-up:  Pt discharged in good condition to home. He is to follow up with Dr. Doreene Burke on 01/06/2015     Discharge Instructions    Activity as tolerated - No restrictions    Complete by:  As directed   Pt to wear Oxygen 4 l/min at all times     Diet - low sodium heart healthy    Complete by:  As directed            DISCHARGE EXAM:  General: Alert, awake, oriented x3, well appearing.  Vital Signs: BP 124/88, HR 71, T 98.3 F (36.8 C), temperature source Oral, RR 16, height 6' (1.829 m), weight 164 lb 0.4 oz (74.4 kg), SpO2 91 %. HEENT: [place]/AT PEERL, EOMI, mild icterus. Neck: Trachea midline, no masses, no thyromegal,y no JVD, no carotid bruit OROPHARYNX:  Moist, No exudate/ erythema/lesions. Geographic tongue. Heart: Regular rate and rhythm, without murmurs, rubs, loud P2 presednt. PMI non-displaced. Exam reveals no decreased pulses. Pulmonary/Chest: Normal effort. Breath sounds normal. No. Apnea. Clear to auscultation,no stridor,  no wheezing and no rhonchi noted. No respiratory distress and no tenderness noted. Abdomen: Soft, nontender, nondistended, normal bowel sounds, no masses no hepatosplenomegaly noted. No fluid wave and no ascites. There is no guarding or rebound. Neuro: Alert and oriented to person, place and time. Normal motor skills, Displays no atrophy or tremors and exhibits normal muscle tone.  No focal neurological deficits noted cranial nerves II through XII grossly intact. No sensory deficit noted. Strength at baseline in bilateral upper and lower extremities. Gait normal. Musculoskeletal: No warm swelling or erythema around joints, no spinal tenderness noted. Psychiatric: Patient alert and oriented x3, good insight and cognition, good recent to remote recall. Mood, memory, affect and judgement normal Lymph node survey: No cervical axillary or inguinal lymphadenopathy noted. Skin: Skin is warm and dry. No bruising, no ecchymosis and no rash noted. Pt is not diaphoretic. No erythema. No pallor    Recent Labs  12/09/14 0530  NA 139  K 4.1  CL 111  CO2 24  GLUCOSE 106*  BUN 13  CREATININE 0.69  CALCIUM 8.7*    Recent Labs  12/09/14 0530  AST 43*  ALT 24  ALKPHOS 85  BILITOT 4.3*  PROT 6.7  ALBUMIN 3.7   No results for input(s): LIPASE, AMYLASE in the last 72 hours.  Recent Labs  12/09/14 0530 12/10/14 0500  WBC 13.9* 14.4*  NEUTROABS 8.4* 8.8*  HGB 5.9* 7.0*  HCT 16.9* 20.1*  MCV 90.9 89.7  PLT 401* 426*     Total time spent including face to face and decision making was greater than 30 minutes  Signed: Kensi Karr A. 12/11/2014, 9:43 AM

## 2014-12-11 NOTE — Progress Notes (Signed)
Patient discharged to home with family, discharge instructions reviewed with patient who verbalized understanding. No new Rx's.

## 2014-12-11 NOTE — Progress Notes (Signed)
MD order for home 02 at 5L/Min. Pt is followed by [employer] for home 02 and was previously using 4L. [place] DME rep alerted that 02 requirement has increased. Rep to help pt obtain equipment for increased 02 demands. CM will continue to follow for DC needs. Marney Doctor RN,BSN,NCM [phone]

## 2014-12-12 NOTE — Clinical Documentation Improvement (Signed)
Dr. Zigmund Daniel - I see your response to [facility] BPA as "I agree" in the body of the BPA.  Please document in the body of your progress note / discharge summary as  cannot code from the BPA response only.  Thank you.  Thank you, Mateo Flow, RN [phone] Clinical Documentation Specialist

## 2014-12-15 ENCOUNTER — Telehealth (HOSPITAL_COMMUNITY): Payer: Self-pay | Admitting: Hematology

## 2014-12-15 ENCOUNTER — Telehealth (HOSPITAL_COMMUNITY): Payer: Self-pay | Admitting: Internal Medicine

## 2014-12-15 ENCOUNTER — Encounter (HOSPITAL_COMMUNITY): Payer: Self-pay

## 2014-12-15 ENCOUNTER — Non-Acute Institutional Stay (HOSPITAL_COMMUNITY)
Admission: AD | Admit: 2014-12-15 | Discharge: 2014-12-15 | Disposition: A | Discharge status: Home / Self Care | Payer: Medicare Other | Attending: Internal Medicine | Admitting: Internal Medicine

## 2014-12-15 DIAGNOSIS — Z87891 Personal history of nicotine dependence: Secondary | ICD-10-CM | POA: Diagnosis not present

## 2014-12-15 DIAGNOSIS — Z9981 Dependence on supplemental oxygen: Secondary | ICD-10-CM | POA: Insufficient documentation

## 2014-12-15 DIAGNOSIS — I10 Essential (primary) hypertension: Secondary | ICD-10-CM | POA: Diagnosis not present

## 2014-12-15 DIAGNOSIS — D57819 Other sickle-cell disorders with crisis, unspecified: Secondary | ICD-10-CM

## 2014-12-15 DIAGNOSIS — R52 Pain, unspecified: Secondary | ICD-10-CM | POA: Diagnosis present

## 2014-12-15 DIAGNOSIS — Z7982 Long term (current) use of aspirin: Secondary | ICD-10-CM | POA: Diagnosis not present

## 2014-12-15 DIAGNOSIS — Z7722 Contact with and (suspected) exposure to environmental tobacco smoke (acute) (chronic): Secondary | ICD-10-CM | POA: Insufficient documentation

## 2014-12-15 DIAGNOSIS — Z96641 Presence of right artificial hip joint: Secondary | ICD-10-CM | POA: Insufficient documentation

## 2014-12-15 DIAGNOSIS — D5700 Hb-SS disease with crisis, unspecified: Secondary | ICD-10-CM | POA: Diagnosis present

## 2014-12-15 DIAGNOSIS — I272 Other secondary pulmonary hypertension: Secondary | ICD-10-CM | POA: Diagnosis not present

## 2014-12-15 DIAGNOSIS — Z79899 Other long term (current) drug therapy: Secondary | ICD-10-CM | POA: Insufficient documentation

## 2014-12-15 DIAGNOSIS — Z7901 Long term (current) use of anticoagulants: Secondary | ICD-10-CM | POA: Diagnosis not present

## 2014-12-15 DIAGNOSIS — Z86711 Personal history of pulmonary embolism: Secondary | ICD-10-CM | POA: Insufficient documentation

## 2014-12-15 MED ORDER — ONDANSETRON HCL 4 MG/2ML IJ SOLN: 4.0000 mg | Freq: Four times a day (QID) | INTRAMUSCULAR | Status: DC | PRN

## 2014-12-15 MED ORDER — NALOXONE HCL 0.4 MG/ML IJ SOLN: 0.4000 mg | INTRAMUSCULAR | Status: DC | PRN

## 2014-12-15 MED ORDER — HYDROMORPHONE 1 MG/ML IV SOLN
INTRAVENOUS | Status: DC
  Administered 2014-12-15: 7.9 mg via INTRAVENOUS
  Administered 2014-12-15: 10:00:00 via INTRAVENOUS
  Administered 2014-12-15: 11.2 mg via INTRAVENOUS
  Filled 2014-12-15: qty 25

## 2014-12-15 MED ORDER — KETOROLAC TROMETHAMINE 30 MG/ML IJ SOLN
30.0000 mg | Freq: Four times a day (QID) | INTRAMUSCULAR | Status: DC
  Administered 2014-12-15: 30 mg via INTRAVENOUS
  Filled 2014-12-15: qty 1

## 2014-12-15 MED ORDER — SODIUM CHLORIDE 0.9 % IJ SOLN: 9.0000 mL | INTRAMUSCULAR | Status: DC | PRN

## 2014-12-15 MED ORDER — HEPARIN SOD (PORK) LOCK FLUSH 100 UNIT/ML IV SOLN
500.0000 [IU] | INTRAVENOUS | Status: AC | PRN
  Administered 2014-12-15: 500 [IU]
  Filled 2014-12-15: qty 5

## 2014-12-15 MED ORDER — SODIUM CHLORIDE 0.9 % IJ SOLN
10.0000 mL | INTRAMUSCULAR | Status: AC | PRN
  Administered 2014-12-15: 10 mL

## 2014-12-15 MED ORDER — SENNOSIDES-DOCUSATE SODIUM 8.6-50 MG PO TABS: 1.0000 | ORAL_TABLET | Freq: Two times a day (BID) | ORAL | Status: DC

## 2014-12-15 MED ORDER — FOLIC ACID 1 MG PO TABS
1.0000 mg | ORAL_TABLET | Freq: Every day | ORAL | Status: DC
  Administered 2014-12-15: 1 mg via ORAL
  Filled 2014-12-15: qty 1

## 2014-12-15 MED ORDER — DIPHENHYDRAMINE HCL 12.5 MG/5ML PO ELIX
12.5000 mg | ORAL_SOLUTION | Freq: Four times a day (QID) | ORAL | Status: DC | PRN
  Administered 2014-12-15: 12.5 mg via ORAL
  Filled 2014-12-15: qty 5

## 2014-12-15 MED ORDER — POLYETHYLENE GLYCOL 3350 17 G PO PACK
17.0000 g | PACK | Freq: Every day | ORAL | Status: DC | PRN
Start: 2014-12-15 — End: 2014-12-15

## 2014-12-15 MED ORDER — DEXTROSE-NACL 5-0.45 % IV SOLN
INTRAVENOUS | Status: DC
  Administered 2014-12-15: 10:00:00 via INTRAVENOUS

## 2014-12-15 MED ORDER — SODIUM CHLORIDE 0.9 % IV SOLN
25.0000 mg | Freq: Four times a day (QID) | INTRAVENOUS | Status: DC | PRN
  Filled 2014-12-15: qty 0.5

## 2014-12-15 NOTE — Discharge Summary (Signed)
Physician Discharge Summary  [name] [id number] DOB: [birth year] DOA: 12/15/2014  PCP: [name], MD  Admit date: 12/15/2014  Discharge date: 12/15/2014  Time spent: 30 minutes  Discharge Diagnoses:  Active Problems:   Sickle-cell disease with pain [facility])   Discharge Condition: Stable  Diet recommendation: Regular  There were no vitals filed for this visit.  History of present illness:   [name] is a 34 y.o. male with history of sickle cell anemia with multiple complications including severe pulmonary hypertension on home oxygen, pulmonary embolism on Lovenox presents to the day hospital with generalized body pain similar to his pain crisis. Patient was recently admitted and discharged for sickle cell pain crisis. On 11/30/2014 patient was admitted for acute respiratory failure and syncope, patient has not been compliant consistently with home oxygen use and anticoagulation use. Patient claims he is compliant with hydroxyurea, he is demanding for an increase in the dose of his home Dilaudid, he is currently on 4 mg tablet by mouth every 4-6 hours when necessary for pain and MS Contin 30 mg tablet by mouth twice a day. Patient claims pain is not controlled with current dosage regimen. He rates his pain at 8 out of 10 today, no fever, no cough, denies chest pain, no dizziness. No leg swelling.   Hospital Course:  [name] was admitted to the day hospital with sickle cell painful crisis. Patient was treated with weight based IV Dilaudid PCA, IV Toradol as well as IV fluids. [name] showed significant improvement symptomatically and pain went down from 8 to 4 out of 10 at the time of discharge. [name] will follow-up in the clinic as previously scheduled, continue with home medications as per prior to admission.  Discharge Exam: Filed Vitals:   12/15/14 1554  BP:   Pulse:   Temp:   Resp: 16    General appearance: alert, cooperative and no  distress Eyes: conjunctivae/corneas clear. PERRL, EOM's intact. Fundi benign. Neck: no adenopathy, no carotid bruit, no JVD, supple, symmetrical, trachea midline and thyroid not enlarged, symmetric, no tenderness/mass/nodules Back: symmetric, no curvature. ROM normal. No CVA tenderness. Resp: clear to auscultation bilaterally Chest wall: no tenderness Cardio: regular rate and rhythm, S1, S2 normal, no murmur, click, rub or gallop GI: soft, non-tender; bowel sounds normal; no masses, no organomegaly Extremities: extremities normal, atraumatic, no cyanosis or edema Pulses: 2+ and symmetric Skin: Skin color, texture, turgor normal. No rashes or lesions Neurologic: Grossly normal  Discharge Instructions We discussed the need for good hydration, monitoring of hydration status, avoidance of heat, cold, stress, and infection triggers. We discussed the need to be compliant with taking Hydrea and home oxygen. [name] was reminded of the need to seek medical attention of any symptoms of bleeding, anemia, or infection occurs.   Current Discharge Medication List    CONTINUE these medications which have NOT CHANGED   Details  aspirin 81 MG chewable tablet Chew 1 tablet (81 mg total) by mouth daily. Qty: 30 tablet, Refills: 11    Cholecalciferol (VITAMIN D) 2000 UNITS tablet Take 1 tablet (2,000 Units total) by mouth daily. Qty: 90 tablet, Refills: 3   Associated Diagnoses: Vitamin D deficiency    enoxaparin (LOVENOX) 120 MG/0.8ML injection Inject 0.8 mLs (120 mg total) into the skin daily. Qty: 30 Syringe, Refills: 2    folic acid (FOLVITE) 1 MG tablet Take 1 tablet (1 mg total) by mouth every morning. Qty: 30 tablet, Refills: 11   Associated Diagnoses: Hb-SS disease  without crisis ([place])    HYDROmorphone (DILAUDID) 4 MG tablet Take 1 tablet (4 mg total) by mouth every 4 (four) hours as needed for severe pain. Qty: 90 tablet, Refills: 0   Associated Diagnoses: Hb-SS disease without crisis  (HCC)    hydroxyurea (HYDREA) 500 MG capsule Take 1 capsule (500 mg total) by mouth daily. May take with food to minimize GI side effects. Qty: 30 capsule, Refills: 0   Associated Diagnoses: Hb-SS disease without crisis ([place])    lisinopril (PRINIVIL,ZESTRIL) 10 MG tablet Take 1 tablet (10 mg total) by mouth daily. Qty: 30 tablet, Refills: 1   Associated Diagnoses: Hb-SS disease without crisis ([place]); Essential hypertension    metoprolol succinate (TOPROL-XL) 50 MG 24 hr tablet Take 1 tablet (50 mg total) by mouth daily. Qty: 30 tablet, Refills: 1    morphine (MS CONTIN) 30 MG 12 hr tablet Take 1 tablet (30 mg total) by mouth every 12 (twelve) hours. Qty: 60 tablet, Refills: 0   Associated Diagnoses: Chronic pain syndrome    potassium chloride SA (K-DUR,KLOR-CON) 20 MEQ tablet Take 1 tablet (20 mEq total) by mouth every morning. Qty: 30 tablet, Refills: 3    zolpidem (AMBIEN) 10 MG tablet Take 1 tablet (10 mg total) by mouth at bedtime as needed for sleep. Qty: 30 tablet, Refills: 0   Associated Diagnoses: Insomnia       No Known Allergies   Significant Diagnostic Studies: Dg Chest 2 View  12/06/2014  CLINICAL DATA:  Sickle cell crisis, 3 hours duration. Severe chest pain. EXAM: CHEST  2 VIEW COMPARISON:  11/26/2014 FINDINGS: There is a left subclavian Port-A-Cath with tip in the low SVC. There is unchanged moderate cardiomegaly. Mild central vascular prominence is unchanged. No pleural effusions. No focal airspace consolidation. No pneumothorax. IMPRESSION: Unchanged cardiomegaly. No confluent airspace consolidation or pleural effusion. Electronically Signed   By: Andreas Newport M.D.   On: 12/06/2014 04:43   Dg Chest 2 View  11/26/2014  CLINICAL DATA:  Sickle cell crisis, shortness of breath. EXAM: CHEST  2 VIEW COMPARISON:  Chest x-ray of November 25, 2014 FINDINGS: The cardiopericardial silhouette remains enlarged. The pulmonary vascularity is less engorged today. The lungs are  well-expanded. There is no focal infiltrate or pleural effusion. The power port appliance tip is stable projecting over the distal third of the SVC just proximal to the cavoatrial junction. There are stable coarse retrocardiac lung markings. There is multilevel degenerative disc disease of the thoracic spine. IMPRESSION: Improved appearance of the pulmonary vascularity and pulmonary interstitium consistent with resolution of pulmonary edema. Stable cardiomegaly. There is no evidence of pneumonia. Electronically Signed   By: David  Martinique M.D.   On: 11/26/2014 15:30   Ct Angio Chest W/cm &/or Wo Cm  11/30/2014  CLINICAL DATA:  Shortness of breath. EXAM: CT ANGIOGRAPHY CHEST WITH CONTRAST TECHNIQUE: Multidetector CT imaging of the chest was performed using the standard protocol during bolus administration of intravenous contrast. Multiplanar CT image reconstructions and MIPs were obtained to evaluate the vascular anatomy. CONTRAST:  124mL OMNIPAQUE IOHEXOL 350 MG/ML SOLN COMPARISON:  10/26/2014 FINDINGS: Mediastinum: Overall examination is suboptimal. Poor pulmonary arterial opacification and respiratory motion artifact diminishes exam detail. The heart size is moderately enlarged. No pericardial effusion. Prominent anterior mediastinal lymph nodes are again noted, unchanged from previous exam compatible with adenopathy. The trachea appears patent and is midline. Unremarkable appearance of the esophagus. Left upper lobe segmental pulmonary artery filling defect is identified, image 88 of series 14. Lungs/Pleura:  No pleural effusion identified. There is atelectasis noted in both lung bases. No airspace consolidation. Upper Abdomen: The visualized portions of the liver are diffusely increased and attenuation. Calcified spleen noted. Musculoskeletal: Bony stigmata of sickle cell disease identified. Review of the MIP images confirms the above findings. IMPRESSION: 1. Exam detail diminished secondary to respiratory  motion artifact and poor pulmonary arterial opacification. 2. Examination is positive for at least 1 segmental branch pulmonary artery filling defect compatible with acute pulmonary embolus. 3. Chronic changes of sickle cell disease. Electronically Signed   By: Kerby Moors M.D.   On: 11/30/2014 10:28   Dg Chest Port 1 View  11/25/2014  CLINICAL DATA:  Current history of sickle cell disease, presenting with hypoxia. Recent diagnosis of pulmonary embolus. EXAM: PORTABLE CHEST 1 VIEW COMPARISON:  Chest x-ray 11/15/2014 and earlier. CTA chest 11/13/2014. CT chest 10/26/2014 and earlier. FINDINGS: Cardiac silhouette massively enlarged, unchanged. Widening of the superior mediastinum, unchanged, shown on prior CT to be due to anterior mediastinal lymphadenopathy. Stable scarring in the lung bases. Lungs otherwise clear. No localized airspace consolidation. No pleural effusions. No pneumothorax. Normal pulmonary vascularity. Left subclavian Port-A-Cath tip projects at the cavoatrial junction, unchanged. IMPRESSION: Stable massive cardiomegaly.  No acute cardiopulmonary disease. Electronically Signed   By: Evangeline Dakin M.D.   On: 11/25/2014 10:01    Signed:  [name] MD, [place], [place], [place], CPE   12/15/2014, 4:37 PM

## 2014-12-15 NOTE — Progress Notes (Signed)
Pt discharged to home; discharge instructions explained, given, and signed; all questions answered; pt states pain level is 5/10 but states should be able to manage at home; pt alert, oriented, and ambulatory; no complications noted

## 2014-12-15 NOTE — H&P (Signed)
[facility] History and Physical  XZAVIOUS BRINKWORTH [id number] DOB: [birth year] DOA: 12/15/2014  PCP: [name], MD   Chief Complaint: No chief complaint on file.   HPI: [name] is a 34 y.o. male with history of sickle cell anemia with multiple complications including severe pulmonary hypertension on home oxygen, pulmonary embolism on Lovenox presents to the day hospital with generalized body pain similar to his pain crisis. Patient was recently admitted and discharged for sickle cell pain crisis. On 11/30/2014 patient was also admitted for acute respiratory failure and syncope, patient has not been compliant consistently with home oxygen use and anticoagulation use. Patient claims he is compliant with hydroxyurea, he is demanding for increasing dose of his home Dilaudid, he is currently on 4 mg tablet by mouth every 4-6 hours when necessary pain and MS Contin 30 mg tablet by mouth twice a day. Patient claims pain is not controlled with current dosage. He rates his pain at 8 out of 10 today, no fever, no cough, denies chest pain, no dizziness. No leg swelling.  Systemic Review: General: The patient denies anorexia, fever, weight loss Cardiac: Denies chest pain, syncope, palpitations, pedal edema  Respiratory: Denies cough, shortness of breath, wheezing GI: Denies severe indigestion/heartburn, abdominal pain, nausea, vomiting, diarrhea and constipation GU: Denies hematuria, incontinence, dysuria  Skin: Denies suspicious skin lesions Neurologic: Denies focal weakness or numbness, change in vision  Past Medical History  Diagnosis Date  . Sickle cell anemia (HCC)   . Blood transfusion   . Acute embolism and thrombosis of right internal jugular vein ([place])   . Hypokalemia   . Mood disorder ([place])   . History of pulmonary embolus (PE)   . Avascular necrosis (HCC)     Right Hip  . Leukocytosis     Chronic  . Thrombocytosis (HCC)     Chronic  .  Hypertension   . History of Clostridium difficile infection   . Uses marijuana   . Chronic anticoagulation   . Functional asplenia   . Former smoker   . Second hand tobacco smoke exposure   . Alcohol consumption of one to four drinks per day   . Noncompliance with medication regimen   . Sickle-cell crisis with associated acute chest syndrome ([place]) 05/13/2013  . Acute chest syndrome ([place]) 06/18/2013  . Demand ischemia ([place]) 01/02/2014  . Pulmonary hypertension [facility])     Past Surgical History  Procedure Laterality Date  . Right hip replacement      08/2006  . Cholecystectomy      01/2008  . Porta cath placement    . Porta cath removal    . Umbilical hernia repair      01/2008  . Excision of left periauricular cyst      10/2009  . Excision of right ear lobe cyst with primary closur      11/2007  . Portacath placement  01/05/2012    Procedure: INSERTION PORT-A-CATH;  Surgeon: Odis Hollingshead, MD;  Location: [facility];  Service: General;  Laterality: N/A;  ultrasound guiced port a cath insertion with fluoroscopy    No Known Allergies  Family History  Problem Relation Age of Onset  . Sickle cell trait Mother   . Depression Mother   . Diabetes Mother   . Sickle cell trait Father   . Sickle cell trait Brother       Prior to Admission medications   Medication Sig Start Date End Date Taking? Authorizing Provider  aspirin 81 MG chewable tablet Chew 1 tablet (81 mg total) by mouth daily. 07/23/14  Yes [name], MD  Cholecalciferol (VITAMIN D) 2000 UNITS tablet Take 1 tablet (2,000 Units total) by mouth daily. 02/13/14  Yes [name], MD  enoxaparin (LOVENOX) 120 MG/0.8ML injection Inject 0.8 mLs (120 mg total) into the skin daily. 12/03/14  Yes [name], MD  folic acid (FOLVITE) 1 MG tablet Take 1 tablet (1 mg total) by mouth every morning. 10/08/13  Yes [name], MD  HYDROmorphone (DILAUDID) 4 MG tablet Take 1 tablet (4 mg total) by mouth every 4  (four) hours as needed for severe pain. 12/02/14  Yes Tresa Garter, MD  hydroxyurea (HYDREA) 500 MG capsule Take 1 capsule (500 mg total) by mouth daily. May take with food to minimize GI side effects. 11/06/14  Yes Dorena Dew, FNP  lisinopril (PRINIVIL,ZESTRIL) 10 MG tablet Take 1 tablet (10 mg total) by mouth daily. 01/22/14  Yes [name], MD  metoprolol succinate (TOPROL-XL) 50 MG 24 hr tablet Take 1 tablet (50 mg total) by mouth daily. 11/28/14  Yes [name], MD  morphine (MS CONTIN) 30 MG 12 hr tablet Take 1 tablet (30 mg total) by mouth every 12 (twelve) hours. 11/18/14  Yes Tresa Garter, MD  potassium chloride SA (K-DUR,KLOR-CON) 20 MEQ tablet Take 1 tablet (20 mEq total) by mouth every morning. 06/09/14  Yes [name], MD  zolpidem (AMBIEN) 10 MG tablet Take 1 tablet (10 mg total) by mouth at bedtime as needed for sleep. 10/23/14  Yes Tresa Garter, MD     Physical Exam: Filed Vitals:   12/15/14 0901  BP: 114/74  Pulse: 82  Temp: 98.4 F (36.9 C)  TempSrc: Oral  Resp: 18  SpO2: 97%    General: Alert, chronically ill-looking, afebrile, icteric +, not in obvious distress HEENT: Normocephalic and Atraumatic, Mucous membranes pink                PERRLA; EOM intact; No scleral icterus,                 Nares: Patent, Oropharynx: Clear, Fair Dentition                 Neck: FROM, no cervical lymphadenopathy, thyromegaly, carotid bruit or JVD;  CHEST WALL: No tenderness  CHEST: Normal respiration, clear to auscultation bilaterally  HEART: Regular rate and rhythm; no murmurs rubs or gallops  BACK: No kyphosis or scoliosis; no CVA tenderness  ABDOMEN: Positive Bowel Sounds, soft, non-tender; no masses, no organomegaly EXTREMITIES: No cyanosis, clubbing, or edema SKIN:  no rash or ulceration  CNS: Alert and Oriented x 4, Nonfocal exam, CN 2-12 intact  Labs on Admission:  Basic Metabolic Panel:  Recent Labs Lab 12/09/14 0530   NA 139  K 4.1  CL 111  CO2 24  GLUCOSE 106*  BUN 13  CREATININE 0.69  CALCIUM 8.7*   Liver Function Tests:  Recent Labs Lab 12/09/14 0530  AST 43*  ALT 24  ALKPHOS 85  BILITOT 4.3*  PROT 6.7  ALBUMIN 3.7   No results for input(s): LIPASE, AMYLASE in the last 168 hours. No results for input(s): AMMONIA in the last 168 hours. CBC:  Recent Labs Lab 12/09/14 0530 12/10/14 0500  WBC 13.9* 14.4*  NEUTROABS 8.4* 8.8*  HGB 5.9* 7.0*  HCT 16.9* 20.1*  MCV 90.9 89.7  PLT 401* 426*   Cardiac Enzymes: No  results for input(s): CKTOTAL, CKMB, CKMBINDEX, TROPONINI in the last 168 hours.  BNP (last 3 results)  Recent Labs  08/29/14 0810 09/10/14 0612 11/30/14 0910  BNP 1117.0* 549.5* 639.7*    ProBNP (last 3 results)  Recent Labs  01/01/14 0909  PROBNP 5790.0*    CBG: No results for input(s): GLUCAP in the last 168 hours.   Assessment/Plan Active Problems:   Sickle-cell disease with pain ([place])   Admits to the Day Hospital  IVF D5 .45% Saline @ 125 mls/hour  Weight based Dilaudid PCA started within 30 minutes of admission  IV Toradol   Monitor vitals very closely, Re-evaluate pain scale every hour  Oxygen by [place]  Patient will be re-evaluated for pain in the context of function and relationship to baseline as care progresses.  If no significant relieve from pain (remains above 5/10) will transfer patient to inpatient services for further evaluation and management  Code Status: Full  Family Communication: None  DVT Prophylaxis: Ambulate as tolerated   Time spent: 80 Minutes  Marrie Chandra, MD, MHA, FACP, FAAP, CPE  If 7PM-7AM, please contact night-coverage [email or web address] 12/15/2014, 9:18 AM

## 2014-12-15 NOTE — Telephone Encounter (Signed)
Patient C/O pain to side and back that is 8/10.  Patient denies chest pain, abdominal pain, N/V/D or priapism.  Patient states he has been taking his medication without any relief.  I advised I would notify the physician and give him a call back.  Patient verbalizes understanding.

## 2014-12-15 NOTE — Telephone Encounter (Signed)
Pt notified that, per MD, he may come to the day hospital for evaluation; pt verbalizes understanding 

## 2014-12-19 ENCOUNTER — Encounter (HOSPITAL_COMMUNITY): Payer: Self-pay | Admitting: Emergency Medicine

## 2014-12-19 ENCOUNTER — Encounter (HOSPITAL_COMMUNITY): Payer: Self-pay | Admitting: *Deleted

## 2014-12-19 ENCOUNTER — Non-Acute Institutional Stay (HOSPITAL_COMMUNITY)
Admission: AD | Admit: 2014-12-19 | Discharge: 2014-12-19 | Disposition: A | Discharge status: Home / Self Care | Payer: Medicare Other | Attending: Internal Medicine | Admitting: Internal Medicine

## 2014-12-19 ENCOUNTER — Emergency Department (HOSPITAL_COMMUNITY)
Admission: EM | Admit: 2014-12-19 | Discharge: 2014-12-19 | Disposition: A | Discharge status: Home / Self Care | Payer: Medicare Other | Source: Home / Self Care | Attending: Emergency Medicine | Admitting: Emergency Medicine

## 2014-12-19 ENCOUNTER — Other Ambulatory Visit: Payer: Self-pay | Admitting: Internal Medicine

## 2014-12-19 DIAGNOSIS — Z9114 Patient's other noncompliance with medication regimen: Secondary | ICD-10-CM | POA: Diagnosis not present

## 2014-12-19 DIAGNOSIS — I10 Essential (primary) hypertension: Secondary | ICD-10-CM | POA: Insufficient documentation

## 2014-12-19 DIAGNOSIS — Z86711 Personal history of pulmonary embolism: Secondary | ICD-10-CM | POA: Insufficient documentation

## 2014-12-19 DIAGNOSIS — D5700 Hb-SS disease with crisis, unspecified: Secondary | ICD-10-CM

## 2014-12-19 DIAGNOSIS — Z87891 Personal history of nicotine dependence: Secondary | ICD-10-CM | POA: Diagnosis not present

## 2014-12-19 DIAGNOSIS — D571 Sickle-cell disease without crisis: Secondary | ICD-10-CM | POA: Diagnosis not present

## 2014-12-19 DIAGNOSIS — M25511 Pain in right shoulder: Secondary | ICD-10-CM | POA: Diagnosis not present

## 2014-12-19 DIAGNOSIS — Z7901 Long term (current) use of anticoagulants: Secondary | ICD-10-CM | POA: Insufficient documentation

## 2014-12-19 DIAGNOSIS — Z7982 Long term (current) use of aspirin: Secondary | ICD-10-CM | POA: Diagnosis not present

## 2014-12-19 DIAGNOSIS — D57819 Other sickle-cell disorders with crisis, unspecified: Secondary | ICD-10-CM | POA: Diagnosis not present

## 2014-12-19 DIAGNOSIS — R0781 Pleurodynia: Secondary | ICD-10-CM | POA: Diagnosis present

## 2014-12-19 DIAGNOSIS — Z96649 Presence of unspecified artificial hip joint: Secondary | ICD-10-CM | POA: Insufficient documentation

## 2014-12-19 DIAGNOSIS — Z79899 Other long term (current) drug therapy: Secondary | ICD-10-CM | POA: Diagnosis not present

## 2014-12-19 LAB — BASIC METABOLIC PANEL
Anion gap: 7 (ref 5–15)
BUN: 12 mg/dL (ref 6–20)
CALCIUM: 9.3 mg/dL (ref 8.9–10.3)
CO2: 25 mmol/L (ref 22–32)
CREATININE: 0.64 mg/dL (ref 0.61–1.24)
Chloride: 106 mmol/L (ref 101–111)
GFR calc Af Amer: >60 mL/min (ref >60)
GLUCOSE: 111 mg/dL — AB (ref 65–99)
POTASSIUM: 3.9 mmol/L (ref 3.5–5.1)
SODIUM: 138 mmol/L (ref 135–145)

## 2014-12-19 LAB — CBC WITH DIFFERENTIAL/PLATELET
BASOS PCT: 1 %
Basophils Absolute: 0.2 10*3/uL — ABNORMAL HIGH (ref 0.0–0.1)
EOS ABS: 0.2 10*3/uL (ref 0.0–0.7)
EOS PCT: 2 %
HCT: 20.9 % — ABNORMAL LOW (ref 39.0–52.0)
HEMOGLOBIN: 7 g/dL — AB (ref 13.0–17.0)
Lymphocytes Relative: 12 %
Lymphs Abs: 1.8 10*3/uL (ref 0.7–4.0)
MCH: 30.6 pg (ref 26.0–34.0)
MCHC: 33.5 g/dL (ref 30.0–36.0)
MCV: 91.3 fL (ref 78.0–100.0)
Monocytes Absolute: 1.9 10*3/uL — ABNORMAL HIGH (ref 0.1–1.0)
Monocytes Relative: 13 %
NEUTROS PCT: 72 %
Neutro Abs: 11 10*3/uL — ABNORMAL HIGH (ref 1.7–7.7)
PLATELETS: 574 10*3/uL — AB (ref 150–400)
RBC: 2.29 MIL/uL — AB (ref 4.22–5.81)
RDW: 18.8 % — ABNORMAL HIGH (ref 11.5–15.5)
WBC: 15.1 10*3/uL — AB (ref 4.0–10.5)

## 2014-12-19 LAB — RETICULOCYTES
RBC.: 2.29 MIL/uL — AB (ref 4.22–5.81)
RETIC CT PCT: 9.6 % — AB (ref 0.4–3.1)
Retic Count, Absolute: 219.8 10*3/uL — ABNORMAL HIGH (ref 19.0–186.0)

## 2014-12-19 MED ORDER — SODIUM CHLORIDE 0.9 % IV SOLN
12.5000 mg | Freq: Four times a day (QID) | INTRAVENOUS | Status: DC | PRN
  Filled 2014-12-19: qty 0.25

## 2014-12-19 MED ORDER — DIPHENHYDRAMINE HCL 50 MG/ML IJ SOLN
25.0000 mg | Freq: Once | INTRAMUSCULAR | Status: AC
  Administered 2014-12-19: 25 mg via INTRAVENOUS
  Filled 2014-12-19: qty 1

## 2014-12-19 MED ORDER — DIPHENHYDRAMINE HCL 12.5 MG/5ML PO ELIX
12.5000 mg | ORAL_SOLUTION | Freq: Four times a day (QID) | ORAL | Status: DC | PRN
Start: 2014-12-19 — End: 2014-12-19

## 2014-12-19 MED ORDER — SODIUM CHLORIDE 0.9 % IJ SOLN
10.0000 mL | INTRAMUSCULAR | Status: AC | PRN
  Administered 2014-12-19: 10 mL

## 2014-12-19 MED ORDER — ONDANSETRON HCL 4 MG/2ML IJ SOLN: 4.0000 mg | Freq: Four times a day (QID) | INTRAMUSCULAR | Status: DC | PRN

## 2014-12-19 MED ORDER — SODIUM CHLORIDE 0.9 % IV BOLUS (SEPSIS)
1000.0000 mL | Freq: Once | INTRAVENOUS | Status: AC
Start: 2014-12-19 — End: 2014-12-19
  Administered 2014-12-19: 1000 mL via INTRAVENOUS

## 2014-12-19 MED ORDER — HYDROMORPHONE 1 MG/ML IV SOLN
INTRAVENOUS | Status: DC
  Administered 2014-12-19: 12.6 mg via INTRAVENOUS
  Administered 2014-12-19: 12:00:00 via INTRAVENOUS
  Filled 2014-12-19: qty 25

## 2014-12-19 MED ORDER — SODIUM CHLORIDE 0.9 % IJ SOLN: 9.0000 mL | INTRAMUSCULAR | Status: DC | PRN

## 2014-12-19 MED ORDER — KETOROLAC TROMETHAMINE 30 MG/ML IJ SOLN
30.0000 mg | Freq: Once | INTRAMUSCULAR | Status: AC
  Administered 2014-12-19: 30 mg via INTRAVENOUS
  Filled 2014-12-19: qty 1

## 2014-12-19 MED ORDER — HEPARIN SOD (PORK) LOCK FLUSH 100 UNIT/ML IV SOLN
500.0000 [IU] | INTRAVENOUS | Status: AC | PRN
  Administered 2014-12-19: 500 [IU]
  Filled 2014-12-19: qty 5

## 2014-12-19 MED ORDER — DEXTROSE-NACL 5-0.45 % IV SOLN
INTRAVENOUS | Status: DC
  Administered 2014-12-19: 12:00:00 via INTRAVENOUS

## 2014-12-19 MED ORDER — HYDROMORPHONE HCL 2 MG/ML IJ SOLN
2.0000 mg | INTRAMUSCULAR | Status: DC
  Administered 2014-12-19 (×3): 2 mg via INTRAVENOUS
  Filled 2014-12-19 (×3): qty 1

## 2014-12-19 MED ORDER — NALOXONE HCL 0.4 MG/ML IJ SOLN: 0.4000 mg | INTRAMUSCULAR | Status: DC | PRN

## 2014-12-19 NOTE — Discharge Summary (Signed)
[facility] Discharge Summary   Patient ID: [name] MRN: [id number] DOB/AGE: 03/25/1980 34 y.o.  Admit date: 12/19/2014 Discharge date: 12/19/2014  Primary Care Physician:  Gerald Chessman, MD  Admission Diagnoses:  Active Problems:   Sickle-cell disease with pain [facility])   Discharge Medications:    Medication List    ASK your doctor about these medications        aspirin 81 MG chewable tablet  Chew 1 tablet (81 mg total) by mouth daily.     enoxaparin 120 MG/0.8ML injection  Commonly known as:  LOVENOX  Inject 0.8 mLs (120 mg total) into the skin daily.     folic acid 1 MG tablet  Commonly known as:  FOLVITE  Take 1 tablet (1 mg total) by mouth every morning.     HYDROmorphone 4 MG tablet  Commonly known as:  DILAUDID  Take 1 tablet (4 mg total) by mouth every 4 (four) hours as needed for severe pain.     hydroxyurea 500 MG capsule  Commonly known as:  HYDREA  Take 1 capsule (500 mg total) by mouth daily. May take with food to minimize GI side effects.     lisinopril 10 MG tablet  Commonly known as:  PRINIVIL,ZESTRIL  Take 1 tablet (10 mg total) by mouth daily.     metoprolol succinate 50 MG 24 hr tablet  Commonly known as:  TOPROL-XL  Take 1 tablet (50 mg total) by mouth daily.     morphine 30 MG 12 hr tablet  Commonly known as:  MS CONTIN  Take 1 tablet (30 mg total) by mouth every 12 (twelve) hours.     potassium chloride SA 20 MEQ tablet  Commonly known as:  K-DUR,KLOR-CON  Take 1 tablet (20 mEq total) by mouth every morning.     Vitamin D 2000 UNITS tablet  Take 1 tablet (2,000 Units total) by mouth daily.     zolpidem 10 MG tablet  Commonly known as:  AMBIEN  Take 1 tablet (10 mg total) by mouth at bedtime as needed for sleep.         Consults:  None  Significant Diagnostic Studies:  Dg Chest 2 View  12/06/2014  CLINICAL DATA:  Sickle cell crisis, 3 hours duration. Severe chest pain. EXAM: CHEST  2 VIEW  COMPARISON:  11/26/2014 FINDINGS: There is a left subclavian Port-A-Cath with tip in the low SVC. There is unchanged moderate cardiomegaly. Mild central vascular prominence is unchanged. No pleural effusions. No focal airspace consolidation. No pneumothorax. IMPRESSION: Unchanged cardiomegaly. No confluent airspace consolidation or pleural effusion. Electronically Signed   By: [name] M.D.   On: 12/06/2014 04:43   Dg Chest 2 View  11/26/2014  CLINICAL DATA:  Sickle cell crisis, shortness of breath. EXAM: CHEST  2 VIEW COMPARISON:  Chest x-ray of November 25, 2014 FINDINGS: The cardiopericardial silhouette remains enlarged. The pulmonary vascularity is less engorged today. The lungs are well-expanded. There is no focal infiltrate or pleural effusion. The power port appliance tip is stable projecting over the distal third of the SVC just proximal to the cavoatrial junction. There are stable coarse retrocardiac lung markings. There is multilevel degenerative disc disease of the thoracic spine. IMPRESSION: Improved appearance of the pulmonary vascularity and pulmonary interstitium consistent with resolution of pulmonary edema. Stable cardiomegaly. There is no evidence of pneumonia. Electronically Signed   By: Gerald  [name] M.D.   On: 11/26/2014 15:30   Ct Angio Chest W/cm &/or Wo  Cm  11/30/2014  CLINICAL DATA:  Shortness of breath. EXAM: CT ANGIOGRAPHY CHEST WITH CONTRAST TECHNIQUE: Multidetector CT imaging of the chest was performed using the standard protocol during bolus administration of intravenous contrast. Multiplanar CT image reconstructions and MIPs were obtained to evaluate the vascular anatomy. CONTRAST:  131mL OMNIPAQUE IOHEXOL 350 MG/ML SOLN COMPARISON:  10/26/2014 FINDINGS: Mediastinum: Overall examination is suboptimal. Poor pulmonary arterial opacification and respiratory motion artifact diminishes exam detail. The heart size is moderately enlarged. No pericardial effusion. Prominent  anterior mediastinal lymph nodes are again noted, unchanged from previous exam compatible with adenopathy. The trachea appears patent and is midline. Unremarkable appearance of the esophagus. Left upper lobe segmental pulmonary artery filling defect is identified, image 88 of series 14. Lungs/Pleura: No pleural effusion identified. There is atelectasis noted in both lung bases. No airspace consolidation. Upper Abdomen: The visualized portions of the liver are diffusely increased and attenuation. Calcified spleen noted. Musculoskeletal: Bony stigmata of sickle cell disease identified. Review of the MIP images confirms the above findings. IMPRESSION: 1. Exam detail diminished secondary to respiratory motion artifact and poor pulmonary arterial opacification. 2. Examination is positive for at least 1 segmental branch pulmonary artery filling defect compatible with acute pulmonary embolus. 3. Chronic changes of sickle cell disease. Electronically Signed   By: [name] M.D.   On: 11/30/2014 10:28   Dg Chest Port 1 View  11/25/2014  CLINICAL DATA:  Current history of sickle cell disease, presenting with hypoxia. Recent diagnosis of pulmonary embolus. EXAM: PORTABLE CHEST 1 VIEW COMPARISON:  Chest x-ray 11/15/2014 and earlier. CTA chest 11/13/2014. CT chest 10/26/2014 and earlier. FINDINGS: Cardiac silhouette massively enlarged, unchanged. Widening of the superior mediastinum, unchanged, shown on prior CT to be due to anterior mediastinal lymphadenopathy. Stable scarring in the lung bases. Lungs otherwise clear. No localized airspace consolidation. No pleural effusions. No pneumothorax. Normal pulmonary vascularity. Left subclavian Port-A-Cath tip projects at the cavoatrial junction, unchanged. IMPRESSION: Stable massive cardiomegaly.  No acute cardiopulmonary disease. Electronically Signed   By: Evangeline Dakin M.D.   On: 11/25/2014 10:01    [facility] Course: -[name] was admitted to  the day infusion center for extended observation.  -Started D5.45 for cellular re-hydration -Reviewed laboratory values, consistent with baseline -High Concentration PCA per weight based protocol for pain control. Patient used a total of 12.6 mg with 25 demands and 18 deliveries. He states that pain intensity is 4/10.  He maintains that he can function at home on current medication regimen. Patient is alert, oriented and ambulatory.    Physical Exam at Discharge:  BP 119/69 mmHg  Pulse 89  Temp(Src) 98.8 F (37.1 C) (Oral)  Resp 17  Ht 6' (1.829 m)  Wt 165 lb (74.844 kg)  BMI 22.37 kg/m2  SpO2 97%   General Appearance:    Alert, cooperative, no distress, appears stated age  Eyes:    PERRL, conjunctiva/corneas clear, EOM's intact, fundi    benign, both eyes       Back:     Symmetric, no curvature, ROM normal, no CVA tenderness  Lungs:     Clear to auscultation bilaterally, respirations unlabored  Chest wall:    No tenderness or deformity  Heart:    Irregular rate and rhythm, S1 and S2 normal, no murmur, rub   or gallop  Lymph nodes:   Cervical, supraclavicular, and axillary nodes normal  Neurologic:   CNII-XII intact. Normal strength, sensation and reflexes      throughout  Disposition at Discharge: 01-Home or Self Care  Discharge Orders:   Condition at Discharge:   Stable  Time spent on Discharge:  15 minutes  Signed: Pepper Wyndham M 12/19/2014, 3:36 PM

## 2014-12-19 NOTE — Progress Notes (Signed)
[facility] for c/o sickle cell pain from the ED. Pt's porta cath was already accessed, flushed with good blood return. Pt was place on a Dilaudid PCA and IV fluids. Pt stated pain was 9 on admission and down to a 4 a discharge. Pt advised of appt in Dec and to becareful because of the smokiness in the air. Pt was discharged to home with discharge instructions.

## 2014-12-19 NOTE — ED Notes (Signed)
Per EMS pt is c/o right shoulder pain  Pt states he has sickle cell and believes he is having a crisis  Pt states this is not his typical type of crisis

## 2014-12-19 NOTE — ED Notes (Signed)
Pt. Wants labs drawn by port. RN,Tiffany made aware.

## 2014-12-19 NOTE — ED Provider Notes (Signed)
CSN: [id number]     Arrival date & time 12/19/14  0530 History   First MD Initiated Contact with Patient 12/19/14 0601     Chief Complaint  Patient presents with  . Sickle Cell Pain Crisis   (Consider location/radiation/quality/duration/timing/severity/associated sxs/prior Treatment) Patient is a 34 y.o. male presenting with sickle cell pain. The history is provided by the patient. No language interpreter was used.  Sickle Cell Pain Crisis Associated symptoms: no chest pain, no fever and no shortness of breath   [name] is a 34 year old male with a history of sickle cell anemia, embolis of jugular vein, PE on Lovenox, avascular necrosis of the hips, hypertension, noncompliance with medication, acute chest syndroms who presents via EMS with right shoulder pain that began 2 hours ago. He states that he took 4 mg of Dilaudid with no relief. He also takes hydroxyurea. Patient is supposed to be on 2 L of oxygen while at home. He states this is typical of his sickle cell pain. Pain is 9/10.  He denies any fever, chills, chest pain, shortness of breath, cough, abdominal pain, nausea, vomiting, or diarrhea.  Past Medical History  Diagnosis Date  . Sickle cell anemia (HCC)   . Blood transfusion   . Acute embolism and thrombosis of right internal jugular vein ([place])   . Hypokalemia   . Mood disorder ([place])   . History of pulmonary embolus (PE)   . Avascular necrosis (HCC)     Right Hip  . Leukocytosis     Chronic  . Thrombocytosis (HCC)     Chronic  . Hypertension   . History of Clostridium difficile infection   . Uses marijuana   . Chronic anticoagulation   . Functional asplenia   . Former smoker   . Second hand tobacco smoke exposure   . Alcohol consumption of one to four drinks per day   . Noncompliance with medication regimen   . Sickle-cell crisis with associated acute chest syndrome ([place]) 05/13/2013  . Acute chest syndrome ([place]) 06/18/2013  . Demand ischemia ([place]) 01/02/2014  .  Pulmonary hypertension [facility])    Past Surgical History  Procedure Laterality Date  . Right hip replacement      08/2006  . Cholecystectomy      01/2008  . Porta cath placement    . Porta cath removal    . Umbilical hernia repair      01/2008  . Excision of left periauricular cyst      10/2009  . Excision of right ear lobe cyst with primary closur      11/2007  . Portacath placement  01/05/2012    Procedure: INSERTION PORT-A-CATH;  Surgeon: Odis Hollingshead, MD;  Location: [place];  Service: General;  Laterality: N/A;  ultrasound guiced port a cath insertion with fluoroscopy   Family History  Problem Relation Age of Onset  . Sickle cell trait Mother   . Depression Mother   . Diabetes Mother   . Sickle cell trait Father   . Sickle cell trait Brother    Social History  Substance Use Topics  . Smoking status: Former Smoker -- 13 years  . Smokeless tobacco: Never Used  . Alcohol Use: No    Review of Systems  Constitutional: Negative for fever.  Respiratory: Negative for shortness of breath.   Cardiovascular: Negative for chest pain.  Musculoskeletal: Positive for back pain and arthralgias.  Skin: Negative for wound.  All other systems reviewed and are negative.  Allergies  Review of patient's allergies indicates no known allergies.  Home Medications   Prior to Admission medications   Medication Sig Start Date End Date Taking? Authorizing Provider  aspirin 81 MG chewable tablet Chew 1 tablet (81 mg total) by mouth daily. 07/23/14  Yes [name], MD  Cholecalciferol (VITAMIN D) 2000 UNITS tablet Take 1 tablet (2,000 Units total) by mouth daily. 02/13/14  Yes [name], MD  enoxaparin (LOVENOX) 120 MG/0.8ML injection Inject 0.8 mLs (120 mg total) into the skin daily. 12/03/14  Yes [name], MD  folic acid (FOLVITE) 1 MG tablet Take 1 tablet (1 mg total) by mouth every morning. 10/08/13  Yes [name], MD  HYDROmorphone (DILAUDID) 4 MG  tablet Take 1 tablet (4 mg total) by mouth every 4 (four) hours as needed for severe pain. 12/02/14  Yes Tresa Garter, MD  hydroxyurea (HYDREA) 500 MG capsule Take 1 capsule (500 mg total) by mouth daily. May take with food to minimize GI side effects. 11/06/14  Yes Dorena Dew, FNP  lisinopril (PRINIVIL,ZESTRIL) 10 MG tablet Take 1 tablet (10 mg total) by mouth daily. 01/22/14  Yes [name], MD  metoprolol succinate (TOPROL-XL) 50 MG 24 hr tablet Take 1 tablet (50 mg total) by mouth daily. 11/28/14  Yes [name], MD  morphine (MS CONTIN) 30 MG 12 hr tablet Take 1 tablet (30 mg total) by mouth every 12 (twelve) hours. 11/18/14  Yes Tresa Garter, MD  potassium chloride SA (K-DUR,KLOR-CON) 20 MEQ tablet Take 1 tablet (20 mEq total) by mouth every morning. 06/09/14  Yes [name], MD  zolpidem (AMBIEN) 10 MG tablet Take 1 tablet (10 mg total) by mouth at bedtime as needed for sleep. 10/23/14  Yes Olugbemiga E Jegede, MD   BP 114/76 mmHg  Pulse 87  Temp(Src) 98.4 F (36.9 C) (Oral)  Resp 14  SpO2 96% Physical Exam  Constitutional: He is oriented to person, place, and time. He appears well-developed and well-nourished.  HENT:  Head: Normocephalic and atraumatic.  Eyes: Conjunctivae are normal.  Neck: Normal range of motion. Neck supple.  Cardiovascular: Normal rate, regular rhythm and normal heart sounds.   Murmur.  Pulmonary/Chest: Effort normal and breath sounds normal. No respiratory distress. He has no wheezes. He has no rales.  Lungs are clear to auscultation bilaterally.  Abdominal: Soft. There is no tenderness.  No abdominal tenderness to palpation.  Musculoskeletal: Normal range of motion.  Full ROM of the right shoulder. 2+ radial pulse. 5/5 grip strength.  Neurological: He is alert and oriented to person, place, and time.  Skin: Skin is warm and dry.  Nursing note and vitals reviewed.   ED Course  Procedures (including critical care  time) Labs Review Labs Reviewed  CBC WITH DIFFERENTIAL/PLATELET - Abnormal; Notable for the following:    WBC 15.1 (*)    RBC 2.29 (*)    Hemoglobin 7.0 (*)    HCT 20.9 (*)    RDW 18.8 (*)    Platelets 574 (*)    Neutro Abs 11.0 (*)    Monocytes Absolute 1.9 (*)    Basophils Absolute 0.2 (*)    All other components within normal limits  RETICULOCYTES - Abnormal; Notable for the following:    Retic Ct Pct 9.6 (*)    RBC. 2.29 (*)    Retic Count, Manual 219.8 (*)    All other components within normal limits  BASIC METABOLIC PANEL -  Abnormal; Notable for the following:    Glucose, Bld 111 (*)    All other components within normal limits    Imaging Review No results found. I have personally reviewed and evaluated these lab results as part of my medical decision-making.   EKG Interpretation None      MDM   Final diagnoses:  Sickle-cell disease with pain ([place])  Sickle cell patient here for right shoulder pain that began 2 hours ago. His reticulocyte count is 220 and Hgb is 7.0.  I do not believe he is in crisis.  Medications  sodium chloride 0.9 % bolus 1,000 mL (0 mLs Intravenous Stopped 12/19/14 0938)  ketorolac (TORADOL) 30 MG/ML injection 30 mg (30 mg Intravenous Given 12/19/14 0938)  diphenhydrAMINE (BENADRYL) injection 25 mg (25 mg Intravenous Given 12/19/14 0938)   Recheck: Patient is requesting to go to day clinic due to pain. He now states that his pain is moved to the left side of his abdomen. Patient unaware if he has his spleen are not. Pain is 6/10 now.  Patient is seen almost every week in the ED.  I spoke to Dr. Zigmund Daniel who stated the patient could go to the day clinic.  I called the day clinic who stated that his IV would need to remain in his arm and that they would come and get him and take him upstairs.   Ottie Glazier, PA-C 12/19/14 [place], MD 12/20/14 [phone]

## 2014-12-19 NOTE — ED Notes (Signed)
Pt transferred to [facility]

## 2014-12-19 NOTE — H&P (Signed)
[facility] History and Physical   Date: 12/19/2014  Patient name: [name] Medical record number: [id number] Date of birth: 06/22/1980 Age: 34 y.o. Gender: male PCP: [name], MD  Attending physician: Tresa Garter, MD  Chief Complaint: Rib pain   History of Present Illness: [name] is a 34 year old male with a history of sickle cell anemia, embolis of jugular vein, PE on Lovenox, avascular necrosis of the hips, hypertension, noncompliance with medication, acute chest syndroms who presents to the day infusion center for pain related to sickle cell anemia. He states that he took 4 mg of Dilaudid with no relief prior to calling EMS. He was treated in the emergency department for pain related to sickle cell anemia. His current pain intensity is 8/10, unrelieved by dilaudid IV received at 10 am in the emergency department. Will admit [name] for treatment in the day infusion center for extended observation of current pain crisis. He denies any fever, chills, chest pain, shortness of breath, cough, abdominal pain, nausea, vomiting, or diarrhea.  Meds: Prescriptions prior to admission  Medication Sig Dispense Refill Last Dose  . aspirin 81 MG chewable tablet Chew 1 tablet (81 mg total) by mouth daily. 30 tablet 11 12/18/2014 at Unknown time  . Cholecalciferol (VITAMIN D) 2000 UNITS tablet Take 1 tablet (2,000 Units total) by mouth daily. 90 tablet 3 12/18/2014 at Unknown time  . enoxaparin (LOVENOX) 120 MG/0.8ML injection Inject 0.8 mLs (120 mg total) into the skin daily. 30 Syringe 2 12/18/2014 at Unknown time  . folic acid (FOLVITE) 1 MG tablet Take 1 tablet (1 mg total) by mouth every morning. 30 tablet 11 12/18/2014 at Unknown time  . HYDROmorphone (DILAUDID) 4 MG tablet Take 1 tablet (4 mg total) by mouth every 4 (four) hours as needed for severe pain. 90 tablet 0 12/19/2014 at Unknown time  . hydroxyurea (HYDREA) 500 MG capsule Take 1  capsule (500 mg total) by mouth daily. May take with food to minimize GI side effects. 30 capsule 0 12/18/2014 at Unknown time  . lisinopril (PRINIVIL,ZESTRIL) 10 MG tablet Take 1 tablet (10 mg total) by mouth daily. 30 tablet 1 12/18/2014 at Unknown time  . metoprolol succinate (TOPROL-XL) 50 MG 24 hr tablet Take 1 tablet (50 mg total) by mouth daily. 30 tablet 1 12/18/2014 at Unknown time  . morphine (MS CONTIN) 30 MG 12 hr tablet Take 1 tablet (30 mg total) by mouth every 12 (twelve) hours. 60 tablet 0 12/18/2014 at Unknown time  . potassium chloride SA (K-DUR,KLOR-CON) 20 MEQ tablet Take 1 tablet (20 mEq total) by mouth every morning. 30 tablet 3 12/18/2014 at Unknown time  . zolpidem (AMBIEN) 10 MG tablet Take 1 tablet (10 mg total) by mouth at bedtime as needed for sleep. 30 tablet 0 Past Week at Unknown time    Allergies: Review of patient's allergies indicates no known allergies. Past Medical History  Diagnosis Date  . Sickle cell anemia (HCC)   . Blood transfusion   . Acute embolism and thrombosis of right internal jugular vein ([place])   . Hypokalemia   . Mood disorder ([place])   . History of pulmonary embolus (PE)   . Avascular necrosis (HCC)     Right Hip  . Leukocytosis     Chronic  . Thrombocytosis (HCC)     Chronic  . Hypertension   . History of Clostridium difficile infection   . Uses marijuana   . Chronic anticoagulation   .  Functional asplenia   . Former smoker   . Second hand tobacco smoke exposure   . Alcohol consumption of one to four drinks per day   . Noncompliance with medication regimen   . Sickle-cell crisis with associated acute chest syndrome ([place]) 05/13/2013  . Acute chest syndrome ([place]) 06/18/2013  . Demand ischemia ([place]) 01/02/2014  . Pulmonary hypertension [facility])    Past Surgical History  Procedure Laterality Date  . Right hip replacement      08/2006  . Cholecystectomy      01/2008  . Porta cath placement    . Porta cath removal    . Umbilical  hernia repair      01/2008  . Excision of left periauricular cyst      10/2009  . Excision of right ear lobe cyst with primary closur      11/2007  . Portacath placement  01/05/2012    Procedure: INSERTION PORT-A-CATH;  Surgeon: Odis Hollingshead, MD;  Location: [facility];  Service: General;  Laterality: N/A;  ultrasound guiced port a cath insertion with fluoroscopy   Family History  Problem Relation Age of Onset  . Sickle cell trait Mother   . Depression Mother   . Diabetes Mother   . Sickle cell trait Father   . Sickle cell trait Brother    Social History   Social History  . Marital Status: Single    Spouse Name: N/A  . Number of Children: 0  . Years of Education: 13   Occupational History  . 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 Smoking status: Former Smoker -- 13 years  . Smokeless tobacco: Never Used  . Alcohol Use: No  . Drug Use: 2.00 per week    Special: Marijuana     Comment: Marijuana weekly  . Sexual Activity:    Partners: Female    Museum/gallery curator: None     Comment: month ago   Other Topics Concern  . Not on file   Social History Narrative   Lives in an apartment.  Single.  Lives alone but has a girlfriend that helps care for him.  Does not use any assist devices.        [name]  [phone] Mom, emergency contact      [facility]:   Patient continuing to live in her apartment in town alone. 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    Review of Systems: Constitutional: negative except for fatigue Eyes: positive for icterus Ears, nose, mouth, throat, and face: negative Respiratory: positive for dyspnea on exertion Cardiovascular: negative Gastrointestinal: negative Genitourinary:negative Musculoskeletal:positive for arthralgias and back pain Neurological: negative Behavioral/Psych: negative Endocrine: negative Allergic/Immunologic: negative  Physical Exam: Blood  pressure 108/75, pulse 84, temperature 98.8 F (37.1 C), temperature source Oral, resp. rate 18, height 6' (1.829 m), weight 165 lb (74.844 kg), SpO2 96 %. BP 108/75 mmHg  Pulse 84  Temp(Src) 98.8 F (37.1 C) (Oral)  Resp 18  Ht 6' (1.829 m)  Wt 165 lb (74.844 kg)  BMI 22.37 kg/m2  SpO2 96%  General Appearance:    Alert, cooperative, mild distress, appears stated age  Head:    Normocephalic, without obvious abnormality, atraumatic  Eyes:    PERRL, conjunctiva/corneas clear, EOM's intact, scleral icterus bilaterally    Ears:    Normal TM's and external ear canals, both ears  Nose:   Nares normal,  septum midline, mucosa normal, no drainage    or sinus tenderness  Throat:   Lips, mucosa, and tongue normal; teeth and gums normal  Neck:   Supple, symmetrical, trachea midline, no adenopathy;       thyroid:  No enlargement/tenderness/nodules; no carotid   bruit or JVD  Back:     Symmetric, no curvature, ROM normal, no CVA tenderness  Lungs:     Clear to auscultation bilaterally, respirations unlabored  Chest wall:    No tenderness or deformity. Left port a cath  Heart:    Irregular rate and regular rhythm  Abdomen:     Soft, non-tender, bowel sounds active all four quadrants,    no masses, no organomegaly  Extremities:   Extremities normal, atraumatic, Left anterior leg, trace edema with tenderness to palpation  Pulses:   2+ and symmetric all extremities  Skin:   Skin color, texture, turgor normal, no rashes or lesions  Lymph nodes:   Cervical, supraclavicular, and axillary nodes normal  Neurologic:   CNII-XII intact. Normal strength, sensation and reflexes      throughout    Lab results: Results for orders placed or performed during the hospital encounter of 12/19/14 (from the past 24 hour(s))  CBC with Differential     Status: Abnormal   Collection Time: 12/19/14  8:14 AM  Result Value Ref Range   WBC 15.1 (H) 4.0 - 10.5 K/uL   RBC 2.29 (L) 4.22 - 5.81 MIL/uL   Hemoglobin 7.0 (L)  13.0 - 17.0 g/dL   HCT 20.9 (L) 39.0 - 52.0 %   MCV 91.3 78.0 - 100.0 fL   MCH 30.6 26.0 - 34.0 pg   MCHC 33.5 30.0 - 36.0 g/dL   RDW 18.8 (H) 11.5 - 15.5 %   Platelets 574 (H) 150 - 400 K/uL   Neutrophils Relative % 72 %   Neutro Abs 11.0 (H) 1.7 - 7.7 K/uL   Lymphocytes Relative 12 %   Lymphs Abs 1.8 0.7 - 4.0 K/uL   Monocytes Relative 13 %   Monocytes Absolute 1.9 (H) 0.1 - 1.0 K/uL   Eosinophils Relative 2 %   Eosinophils Absolute 0.2 0.0 - 0.7 K/uL   Basophils Relative 1 %   Basophils Absolute 0.2 (H) 0.0 - 0.1 K/uL  Reticulocytes     Status: Abnormal   Collection Time: 12/19/14  8:14 AM  Result Value Ref Range   Retic Ct Pct 9.6 (H) 0.4 - 3.1 %   RBC. 2.29 (L) 4.22 - 5.81 MIL/uL   Retic Count, Manual 219.8 (H) 19.0 - 186.0 K/uL  Basic metabolic panel     Status: Abnormal   Collection Time: 12/19/14  8:14 AM  Result Value Ref Range   Sodium 138 135 - 145 mmol/L   Potassium 3.9 3.5 - 5.1 mmol/L   Chloride 106 101 - 111 mmol/L   CO2 25 22 - 32 mmol/L   Glucose, Bld 111 (H) 65 - 99 mg/dL   BUN 12 6 - 20 mg/dL   Creatinine, Ser 0.64 0.61 - 1.24 mg/dL   Calcium 9.3 8.9 - 10.3 mg/dL   GFR calc non Af Amer >60 >60 mL/min   GFR calc Af Amer >60 >60 mL/min   Anion gap 7 5 - 15   *Note: Due to a large number of results and/or encounters for the requested time period, some results have not been displayed. A complete set of results can be found in Results Review.    Imaging results:  No results found.   Assessment & Plan:  Patient will be admitted to the day infusion center for extended observation  Start IV D5.45 for cellular rehydration at 125/hr  Start Toradol 30 mg IV every 6 hours for inflammation.  Start Dilaudid PCA High Concentration per weight based protocol.   Patient will be re-evaluated for pain intensity in the context of function and relationship to baseline as care progresses.  If no significant pain relief, will transfer patient to inpatient services  for a higher level of care.   Reviewed labs will add on LDH.  Tayanna Talford M 12/19/2014, 11:27 AM

## 2014-12-19 NOTE — ED Notes (Signed)
Pt c/o rt shoulder pain to the joint area; pt also c/o mild back pain; pt states that this is typical of his sickle cell pain; pt ambulatory in no acute distress

## 2014-12-23 ENCOUNTER — Telehealth: Payer: Self-pay | Admitting: Internal Medicine

## 2014-12-23 ENCOUNTER — Other Ambulatory Visit: Payer: Self-pay

## 2014-12-23 DIAGNOSIS — D571 Sickle-cell disease without crisis: Secondary | ICD-10-CM

## 2014-12-23 DIAGNOSIS — G4700 Insomnia, unspecified: Secondary | ICD-10-CM

## 2014-12-23 DIAGNOSIS — G894 Chronic pain syndrome: Secondary | ICD-10-CM

## 2014-12-23 MED ORDER — HYDROMORPHONE HCL 4 MG PO TABS: 4.0000 mg | ORAL_TABLET | ORAL | Status: DC | PRN

## 2014-12-23 MED ORDER — MORPHINE SULFATE ER 30 MG PO TBCR: 30.0000 mg | EXTENDED_RELEASE_TABLET | Freq: Two times a day (BID) | ORAL | Status: DC

## 2014-12-23 MED ORDER — ZOLPIDEM TARTRATE 10 MG PO TABS: 10.0000 mg | ORAL_TABLET | Freq: Every evening | ORAL | Status: DC | PRN

## 2014-12-23 NOTE — Telephone Encounter (Signed)
Refill request for dilaudid 4mg , morphine 30mg , and ambien 10mg . LOV 12/05/2014. Please advise. Thanks!

## 2014-12-23 NOTE — Telephone Encounter (Signed)
Reviewed [facility] Substance Reporting system prior to reorder; no inconsistencies noted.   Meds ordered this encounter  Medications  . morphine (MS CONTIN) 30 MG 12 hr tablet    Sig: Take 1 tablet (30 mg total) by mouth every 12 (twelve) hours.    Dispense:  60 tablet    Refill:  0    Do not fill prior to 01/01/2015    Order Specific Question:  Supervising Provider    Answer:  Tresa Garter [id number]  . zolpidem (AMBIEN) 10 MG tablet    Sig: Take 1 tablet (10 mg total) by mouth at bedtime as needed for sleep.    Dispense:  30 tablet    Refill:  0    Order Specific Question:  Supervising Provider    Answer:  Tresa Garter [id number]  . HYDROmorphone (DILAUDID) 4 MG tablet    Sig: Take 1 tablet (4 mg total) by mouth every 4 (four) hours as needed for severe pain.    Dispense:  90 tablet    Refill:  0    Order Specific Question:  Supervising Provider    Answer:  Tresa Garter [id number]     Dorena Dew, FNP

## 2014-12-24 ENCOUNTER — Encounter (HOSPITAL_COMMUNITY): Payer: Self-pay | Admitting: *Deleted

## 2014-12-24 ENCOUNTER — Encounter (HOSPITAL_COMMUNITY): Payer: Self-pay | Admitting: Emergency Medicine

## 2014-12-24 ENCOUNTER — Emergency Department (HOSPITAL_COMMUNITY)
Admission: EM | Admit: 2014-12-24 | Discharge: 2014-12-24 | Disposition: A | Discharge status: Nursing Home (Includes ICF) | Payer: Medicare Other | Source: Home / Self Care | Attending: Emergency Medicine | Admitting: Emergency Medicine

## 2014-12-24 ENCOUNTER — Non-Acute Institutional Stay (HOSPITAL_COMMUNITY)
Admission: AD | Admit: 2014-12-24 | Discharge: 2014-12-24 | Disposition: A | Discharge status: Home / Self Care | Payer: Medicare Other | Source: Intra-hospital | Attending: Internal Medicine | Admitting: Internal Medicine

## 2014-12-24 DIAGNOSIS — Z9981 Dependence on supplemental oxygen: Secondary | ICD-10-CM

## 2014-12-24 DIAGNOSIS — Z7901 Long term (current) use of anticoagulants: Secondary | ICD-10-CM | POA: Diagnosis not present

## 2014-12-24 DIAGNOSIS — Z7722 Contact with and (suspected) exposure to environmental tobacco smoke (acute) (chronic): Secondary | ICD-10-CM | POA: Diagnosis not present

## 2014-12-24 DIAGNOSIS — I10 Essential (primary) hypertension: Secondary | ICD-10-CM

## 2014-12-24 DIAGNOSIS — Z96641 Presence of right artificial hip joint: Secondary | ICD-10-CM | POA: Diagnosis present

## 2014-12-24 DIAGNOSIS — S80812A Abrasion, left lower leg, initial encounter: Secondary | ICD-10-CM | POA: Diagnosis not present

## 2014-12-24 DIAGNOSIS — D5700 Hb-SS disease with crisis, unspecified: Secondary | ICD-10-CM | POA: Diagnosis not present

## 2014-12-24 DIAGNOSIS — I272 Other secondary pulmonary hypertension: Secondary | ICD-10-CM | POA: Insufficient documentation

## 2014-12-24 DIAGNOSIS — Z86718 Personal history of other venous thrombosis and embolism: Secondary | ICD-10-CM | POA: Insufficient documentation

## 2014-12-24 DIAGNOSIS — Z79899 Other long term (current) drug therapy: Secondary | ICD-10-CM

## 2014-12-24 DIAGNOSIS — I5022 Chronic systolic (congestive) heart failure: Secondary | ICD-10-CM | POA: Diagnosis not present

## 2014-12-24 DIAGNOSIS — R52 Pain, unspecified: Secondary | ICD-10-CM | POA: Diagnosis not present

## 2014-12-24 DIAGNOSIS — Z832 Family history of diseases of the blood and blood-forming organs and certain disorders involving the immune mechanism: Secondary | ICD-10-CM

## 2014-12-24 DIAGNOSIS — F39 Unspecified mood [affective] disorder: Secondary | ICD-10-CM | POA: Diagnosis not present

## 2014-12-24 DIAGNOSIS — X58XXXA Exposure to other specified factors, initial encounter: Secondary | ICD-10-CM

## 2014-12-24 DIAGNOSIS — Y9389 Activity, other specified: Secondary | ICD-10-CM

## 2014-12-24 DIAGNOSIS — E876 Hypokalemia: Secondary | ICD-10-CM

## 2014-12-24 DIAGNOSIS — D72829 Elevated white blood cell count, unspecified: Secondary | ICD-10-CM | POA: Diagnosis not present

## 2014-12-24 DIAGNOSIS — Z9114 Patient's other noncompliance with medication regimen: Secondary | ICD-10-CM | POA: Diagnosis not present

## 2014-12-24 DIAGNOSIS — Z818 Family history of other mental and behavioral disorders: Secondary | ICD-10-CM

## 2014-12-24 DIAGNOSIS — Z87891 Personal history of nicotine dependence: Secondary | ICD-10-CM | POA: Diagnosis not present

## 2014-12-24 DIAGNOSIS — Z8619 Personal history of other infectious and parasitic diseases: Secondary | ICD-10-CM | POA: Insufficient documentation

## 2014-12-24 DIAGNOSIS — Z8739 Personal history of other diseases of the musculoskeletal system and connective tissue: Secondary | ICD-10-CM | POA: Insufficient documentation

## 2014-12-24 DIAGNOSIS — R140 Abdominal distension (gaseous): Secondary | ICD-10-CM

## 2014-12-24 DIAGNOSIS — Z7982 Long term (current) use of aspirin: Secondary | ICD-10-CM | POA: Insufficient documentation

## 2014-12-24 DIAGNOSIS — Z79891 Long term (current) use of opiate analgesic: Secondary | ICD-10-CM

## 2014-12-24 DIAGNOSIS — Z86711 Personal history of pulmonary embolism: Secondary | ICD-10-CM

## 2014-12-24 DIAGNOSIS — G894 Chronic pain syndrome: Secondary | ICD-10-CM | POA: Diagnosis present

## 2014-12-24 DIAGNOSIS — Y9289 Other specified places as the place of occurrence of the external cause: Secondary | ICD-10-CM | POA: Insufficient documentation

## 2014-12-24 DIAGNOSIS — I2781 Cor pulmonale (chronic): Secondary | ICD-10-CM | POA: Diagnosis not present

## 2014-12-24 DIAGNOSIS — Z833 Family history of diabetes mellitus: Secondary | ICD-10-CM

## 2014-12-24 DIAGNOSIS — J9611 Chronic respiratory failure with hypoxia: Secondary | ICD-10-CM | POA: Diagnosis not present

## 2014-12-24 DIAGNOSIS — Q8901 Asplenia (congenital): Secondary | ICD-10-CM

## 2014-12-24 DIAGNOSIS — D638 Anemia in other chronic diseases classified elsewhere: Secondary | ICD-10-CM | POA: Diagnosis present

## 2014-12-24 DIAGNOSIS — Z7902 Long term (current) use of antithrombotics/antiplatelets: Secondary | ICD-10-CM

## 2014-12-24 DIAGNOSIS — Y998 Other external cause status: Secondary | ICD-10-CM | POA: Insufficient documentation

## 2014-12-24 DIAGNOSIS — Z9049 Acquired absence of other specified parts of digestive tract: Secondary | ICD-10-CM | POA: Insufficient documentation

## 2014-12-24 LAB — CBC WITH DIFFERENTIAL/PLATELET
BASOS ABS: 0 10*3/uL (ref 0.0–0.1)
BASOS PCT: 0 %
EOS ABS: 0.4 10*3/uL (ref 0.0–0.7)
Eosinophils Relative: 2 %
HCT: 21.4 % — ABNORMAL LOW (ref 39.0–52.0)
Hemoglobin: 7.3 g/dL — ABNORMAL LOW (ref 13.0–17.0)
LYMPHS PCT: 11 %
Lymphs Abs: 2.4 10*3/uL (ref 0.7–4.0)
MCH: 31.7 pg (ref 26.0–34.0)
MCHC: 34.1 g/dL (ref 30.0–36.0)
MCV: 93 fL (ref 78.0–100.0)
MONO ABS: 2.6 10*3/uL — AB (ref 0.1–1.0)
Monocytes Relative: 12 %
NEUTROS ABS: 16.4 10*3/uL — AB (ref 1.7–7.7)
NEUTROS PCT: 75 %
PLATELETS: 559 10*3/uL — AB (ref 150–400)
RBC: 2.3 MIL/uL — ABNORMAL LOW (ref 4.22–5.81)
RDW: 19.1 % — AB (ref 11.5–15.5)
WBC: 21.8 10*3/uL — ABNORMAL HIGH (ref 4.0–10.5)

## 2014-12-24 LAB — I-STAT CHEM 8, ED
BUN: 16 mg/dL (ref 6–20)
CALCIUM ION: 1.24 mmol/L — AB (ref 1.12–1.23)
CHLORIDE: 104 mmol/L (ref 101–111)
Creatinine, Ser: 0.9 mg/dL (ref 0.61–1.24)
GLUCOSE: 105 mg/dL — AB (ref 65–99)
HCT: 23 % — ABNORMAL LOW (ref 39.0–52.0)
HEMOGLOBIN: 7.8 g/dL — AB (ref 13.0–17.0)
Potassium: 3.6 mmol/L (ref 3.5–5.1)
SODIUM: 141 mmol/L (ref 135–145)
TCO2: 21 mmol/L (ref 0–100)

## 2014-12-24 LAB — RETICULOCYTES
RBC.: 2.3 MIL/uL — AB (ref 4.22–5.81)
RETIC CT PCT: 15.1 % — AB (ref 0.4–3.1)
Retic Count, Absolute: 347.3 10*3/uL — ABNORMAL HIGH (ref 19.0–186.0)

## 2014-12-24 MED ORDER — DEXTROSE-NACL 5-0.45 % IV SOLN
INTRAVENOUS | Status: DC
  Administered 2014-12-24: 10:00:00 via INTRAVENOUS

## 2014-12-24 MED ORDER — NALOXONE HCL 0.4 MG/ML IJ SOLN: 0.4000 mg | INTRAMUSCULAR | Status: DC | PRN

## 2014-12-24 MED ORDER — DIPHENHYDRAMINE HCL 12.5 MG/5ML PO ELIX
12.5000 mg | ORAL_SOLUTION | Freq: Four times a day (QID) | ORAL | Status: DC | PRN
  Administered 2014-12-24: 12.5 mg via ORAL
  Filled 2014-12-24: qty 5

## 2014-12-24 MED ORDER — HYDROMORPHONE HCL 4 MG PO TABS
4.0000 mg | ORAL_TABLET | Freq: Once | ORAL | Status: AC
  Administered 2014-12-24: 4 mg via ORAL
  Filled 2014-12-24: qty 1

## 2014-12-24 MED ORDER — ONDANSETRON HCL 4 MG/2ML IJ SOLN: 4.0000 mg | Freq: Four times a day (QID) | INTRAMUSCULAR | Status: DC | PRN

## 2014-12-24 MED ORDER — HYDROMORPHONE HCL 1 MG/ML IJ SOLN
1.0000 mg | Freq: Once | INTRAMUSCULAR | Status: AC
  Administered 2014-12-24: 1 mg via INTRAVENOUS
  Filled 2014-12-24: qty 1

## 2014-12-24 MED ORDER — HYDROMORPHONE HCL 2 MG/ML IJ SOLN
2.0000 mg | Freq: Once | INTRAMUSCULAR | Status: AC
  Administered 2014-12-24: 2 mg via INTRAVENOUS
  Filled 2014-12-24: qty 1

## 2014-12-24 MED ORDER — SODIUM CHLORIDE 0.9 % IV SOLN
12.5000 mg | Freq: Four times a day (QID) | INTRAVENOUS | Status: DC | PRN
  Filled 2014-12-24: qty 0.25

## 2014-12-24 MED ORDER — KETOROLAC TROMETHAMINE 30 MG/ML IJ SOLN
30.0000 mg | Freq: Once | INTRAMUSCULAR | Status: AC
  Administered 2014-12-24: 30 mg via INTRAVENOUS
  Filled 2014-12-24: qty 1

## 2014-12-24 MED ORDER — HYDROMORPHONE 1 MG/ML IV SOLN
INTRAVENOUS | Status: DC
  Administered 2014-12-24: 10:00:00 via INTRAVENOUS
  Administered 2014-12-24: 11.2 mg via INTRAVENOUS
  Filled 2014-12-24: qty 25

## 2014-12-24 MED ORDER — SODIUM CHLORIDE 0.9 % IJ SOLN: 9.0000 mL | INTRAMUSCULAR | Status: DC | PRN

## 2014-12-24 MED ORDER — DIPHENHYDRAMINE HCL 50 MG/ML IJ SOLN
25.0000 mg | Freq: Once | INTRAMUSCULAR | Status: AC
  Administered 2014-12-24: 25 mg via INTRAVENOUS
  Filled 2014-12-24: qty 1

## 2014-12-24 MED ORDER — SODIUM CHLORIDE 0.9 % IJ SOLN: 10.0000 mL | INTRAMUSCULAR | Status: DC | PRN

## 2014-12-24 MED ORDER — SODIUM CHLORIDE 0.9 % IV BOLUS (SEPSIS)
1000.0000 mL | Freq: Once | INTRAVENOUS | Status: AC
  Administered 2014-12-24: 1000 mL via INTRAVENOUS

## 2014-12-24 MED ORDER — HEPARIN SOD (PORK) LOCK FLUSH 100 UNIT/ML IV SOLN
500.0000 [IU] | INTRAVENOUS | Status: AC | PRN
  Administered 2014-12-24: 500 [IU]
  Filled 2014-12-24: qty 5

## 2014-12-24 NOTE — ED Notes (Signed)
Per Gwen at [facility], pt should be on 2L of O2

## 2014-12-24 NOTE — ED Notes (Addendum)
Report given to [facility], RN at [facility]

## 2014-12-24 NOTE — ED Notes (Signed)
MD at bedside. 

## 2014-12-24 NOTE — ED Notes (Signed)
Pt BIBA, reports [facility] x1 hour PTA. Pt c/o usual lower back and flank pain, and new LLE pain, but has been occuring outside of [facility]. Pt ambulated from ambulance bay.

## 2014-12-24 NOTE — Progress Notes (Signed)
Pt received treatment for crises with IV fluids, Toradol, and Dilaudid PCA. Pt's pain # went from 7 to 4 at discharge. Pt reminded to drink fluids and take pain meds as prescribed. Discharge instructions given to him with verbal understanding. Pt alert, oriented and ambulatory discharged to home.

## 2014-12-24 NOTE — ED Notes (Signed)
O2 applied after speaking with pt; pt states "I usually wear oxygen at home at like 4 Liters".

## 2014-12-24 NOTE — ED Provider Notes (Signed)
CSN: [id number]     Arrival date & time 12/24/14  0300 History   By signing my name below, I, Forrestine Him, attest that this documentation has been prepared under the direction and in the presence of Baxter Gonzalez, MD.  Electronically Signed: Forrestine Him, ED Scribe. 12/24/2014. 4:04 AM.   Chief Complaint  Patient presents with  . Sickle Cell Pain Crisis   Patient is a 34 y.o. male presenting with sickle cell pain. The history is provided by the patient. No language interpreter was used.  Sickle Cell Pain Crisis Location:  Back Severity:  Severe Onset quality:  Gradual Similar to previous crisis episodes: yes   Timing:  Constant Progression:  Unchanged Chronicity:  Recurrent Sickle cell genotype:  SS Context: not alcohol consumption   Relieved by:  Nothing Worsened by:  Nothing tried Ineffective treatments:  Prescription drugs Associated symptoms: no chest pain, no cough, no fever, no headaches, no shortness of breath and no vomiting   Risk factors: no frequent admissions for fever     HPI Comments: KEENAN DELAUDER is a 34 y.o. male with a PMHx of sickle cell anemia and HTN who presents to the Emergency Department here for a sickle cell pain crisis this evening. Pt reports constant, ongoing pain to the ribs and back. No aggravating or alleviating factors. Pt is prescribed PO Dilaudid at home but denies any improvement. No recent fever, chills, nausea, or vomiting. No dysuria, hematuria, or urgency. No known allergies to medications.  PCP: [name], MD    Past Medical History  Diagnosis Date  . Sickle cell anemia (HCC)   . Blood transfusion   . Acute embolism and thrombosis of right internal jugular vein ([facility])   . Hypokalemia   . Mood disorder ([place])   . History of pulmonary embolus (PE)   . Avascular necrosis (HCC)     Right Hip  . Leukocytosis     Chronic  . Thrombocytosis (HCC)     Chronic  . Hypertension   . History of Clostridium difficile infection   .  Uses marijuana   . Chronic anticoagulation   . Functional asplenia   . Former smoker   . Second hand tobacco smoke exposure   . Alcohol consumption of one to four drinks per day   . Noncompliance with medication regimen   . Sickle-cell crisis with associated acute chest syndrome ([place]) 05/13/2013  . Acute chest syndrome ([place]) 06/18/2013  . Demand ischemia ([place]) 01/02/2014  . Pulmonary hypertension [facility])    Past Surgical History  Procedure Laterality Date  . Right hip replacement      08/2006  . Cholecystectomy      01/2008  . Porta cath placement    . Porta cath removal    . Umbilical hernia repair      01/2008  . Excision of left periauricular cyst      10/2009  . Excision of right ear lobe cyst with primary closur      11/2007  . Portacath placement  01/05/2012    Procedure: INSERTION PORT-A-CATH;  Surgeon: Odis Hollingshead, MD;  Location: [facility];  Service: General;  Laterality: N/A;  ultrasound guiced port a cath insertion with fluoroscopy   Family History  Problem Relation Age of Onset  . Sickle cell trait Mother   . Depression Mother   . Diabetes Mother   . Sickle cell trait Father   . Sickle cell trait Brother    Social History  Substance Use  Topics  . Smoking status: Former Smoker -- 13 years  . Smokeless tobacco: Never Used  . Alcohol Use: No    Review of Systems  Constitutional: Negative for fever and chills.  Respiratory: Negative for cough and shortness of breath.   Cardiovascular: Negative for chest pain.  Gastrointestinal: Negative for vomiting, abdominal pain and diarrhea.  Genitourinary: Negative for dysuria.  Musculoskeletal: Positive for back pain and arthralgias.  Skin: Negative for rash.  Neurological: Negative for dizziness, weakness, numbness and headaches.  Psychiatric/Behavioral: Negative for confusion.  All other systems reviewed and are negative.     Allergies  Review of patient's allergies indicates no known allergies.  Home Medications    Prior to Admission medications   Medication Sig Start Date End Date Taking? Authorizing Provider  aspirin 81 MG chewable tablet Chew 1 tablet (81 mg total) by mouth daily. 07/23/14   [name], MD  Cholecalciferol (VITAMIN D) 2000 UNITS tablet Take 1 tablet (2,000 Units total) by mouth daily. 02/13/14   [name], MD  enoxaparin (LOVENOX) 120 MG/0.8ML injection Inject 0.8 mLs (120 mg total) into the skin daily. 12/03/14   [name], MD  folic acid (FOLVITE) 1 MG tablet Take 1 tablet (1 mg total) by mouth every morning. 10/08/13   [name], MD  HYDROmorphone (DILAUDID) 4 MG tablet Take 1 tablet (4 mg total) by mouth every 4 (four) hours as needed for severe pain. 12/23/14   Dorena Dew, FNP  hydroxyurea (HYDREA) 500 MG capsule Take 1 capsule (500 mg total) by mouth daily. May take with food to minimize GI side effects. 11/06/14   Dorena Dew, FNP  lisinopril (PRINIVIL,ZESTRIL) 10 MG tablet Take 1 tablet (10 mg total) by mouth daily. 01/22/14   [name], MD  metoprolol succinate (TOPROL-XL) 50 MG 24 hr tablet Take 1 tablet (50 mg total) by mouth daily. 11/28/14   [name], MD  morphine (MS CONTIN) 30 MG 12 hr tablet Take 1 tablet (30 mg total) by mouth every 12 (twelve) hours. 12/23/14   Dorena Dew, FNP  potassium chloride SA (K-DUR,KLOR-CON) 20 MEQ tablet Take 1 tablet (20 mEq total) by mouth every morning. 06/09/14   [name], MD  zolpidem (AMBIEN) 10 MG tablet Take 1 tablet (10 mg total) by mouth at bedtime as needed for sleep. 12/23/14   Dorena Dew, FNP   Triage Vitals: BP 119/79 mmHg  Pulse 91  Temp(Src) 98.9 F (37.2 C) (Oral)  Resp 18  Ht 6' (1.829 m)  Wt 165 lb (74.844 kg)  BMI 22.37 kg/m2  SpO2 95%   Physical Exam  Constitutional: He is oriented to person, place, and time. He appears well-developed and well-nourished.  HENT:  Head: Normocephalic and atraumatic.  Mouth/Throat: Oropharynx is  clear and moist. No oropharyngeal exudate.  Eyes: EOM are normal. Pupils are equal, round, and reactive to light.  Neck: Normal range of motion. Neck supple.  Cardiovascular: Normal rate, regular rhythm, normal heart sounds and intact distal pulses.   Pulmonary/Chest: Effort normal and breath sounds normal. No respiratory distress.  Abdominal: Soft. He exhibits no distension. There is no tenderness. There is no rebound and no guarding.  Gas noted in abdomen   Musculoskeletal: Normal range of motion. He exhibits no edema.  Abrasion with a small abrasion in the middle to the L medial calf  Neurological: He is alert and oriented to person, place, and time. He has normal reflexes.  Skin: Skin is warm and dry.  Psychiatric: He has a normal mood and affect. Judgment normal.  Nursing note and vitals reviewed.   ED Course  Procedures (including critical care time)  DIAGNOSTIC STUDIES: Oxygen Saturation is 95% on RA, adequate by my interpretation.    COORDINATION OF CARE: 3:58 AM- Will give fluids, Dilaudid, Toradol, and Benadryl. Will order CBC, Reticulocytes, and i-stat chem 8. Discussed treatment plan with pt at bedside and pt agreed to plan.     Labs Review Labs Reviewed - No data to display  Imaging Review No results found. I have personally reviewed and evaluated these images and lab results as part of my medical decision-making.   EKG Interpretation None      MDM   Final diagnoses:  None   Results for orders placed or performed during the hospital encounter of 12/24/14  CBC with Differential/Platelet  Result Value Ref Range   WBC 21.8 (H) 4.0 - 10.5 K/uL   RBC 2.30 (L) 4.22 - 5.81 MIL/uL   Hemoglobin 7.3 (L) 13.0 - 17.0 g/dL   HCT 21.4 (L) 39.0 - 52.0 %   MCV 93.0 78.0 - 100.0 fL   MCH 31.7 26.0 - 34.0 pg   MCHC 34.1 30.0 - 36.0 g/dL   RDW 19.1 (H) 11.5 - 15.5 %   Platelets 559 (H) 150 - 400 K/uL   Neutrophils Relative % 75 %   Lymphocytes Relative 11 %    Monocytes Relative 12 %   Eosinophils Relative 2 %   Basophils Relative 0 %   Neutro Abs 16.4 (H) 1.7 - 7.7 K/uL   Lymphs Abs 2.4 0.7 - 4.0 K/uL   Monocytes Absolute 2.6 (H) 0.1 - 1.0 K/uL   Eosinophils Absolute 0.4 0.0 - 0.7 K/uL   Basophils Absolute 0.0 0.0 - 0.1 K/uL   RBC Morphology POLYCHROMASIA PRESENT    Smear Review LARGE PLATELETS PRESENT   Reticulocytes  Result Value Ref Range   Retic Ct Pct 15.1 (H) 0.4 - 3.1 %   RBC. 2.30 (L) 4.22 - 5.81 MIL/uL   Retic Count, Manual 347.3 (H) 19.0 - 186.0 K/uL  I-Stat Chem 8, ED  Result Value Ref Range   Sodium 141 135 - 145 mmol/L   Potassium 3.6 3.5 - 5.1 mmol/L   Chloride 104 101 - 111 mmol/L   BUN 16 6 - 20 mg/dL   Creatinine, Ser 0.90 0.61 - 1.24 mg/dL   Glucose, Bld 105 (H) 65 - 99 mg/dL   Calcium, Ion 1.24 (H) 1.12 - 1.23 mmol/L   TCO2 21 0 - 100 mmol/L   Hemoglobin 7.8 (L) 13.0 - 17.0 g/dL   HCT 23.0 (L) 39.0 - 52.0 %   *Note: Due to a large number of results and/or encounters for the requested time period, some results have not been displayed. A complete set of results can be found in Results Review.   Dg Chest 2 View  12/06/2014  CLINICAL DATA:  Sickle cell crisis, 3 hours duration. Severe chest pain. EXAM: CHEST  2 VIEW COMPARISON:  11/26/2014 FINDINGS: There is a left subclavian Port-A-Cath with tip in the low SVC. There is unchanged moderate cardiomegaly. Mild central vascular prominence is unchanged. No pleural effusions. No focal airspace consolidation. No pneumothorax. IMPRESSION: Unchanged cardiomegaly. No confluent airspace consolidation or pleural effusion. Electronically Signed   By: Andreas Newport M.D.   On: 12/06/2014 04:43   Dg Chest 2 View  11/26/2014  CLINICAL DATA:  Sickle cell crisis, shortness  of breath. EXAM: CHEST  2 VIEW COMPARISON:  Chest x-ray of November 25, 2014 FINDINGS: The cardiopericardial silhouette remains enlarged. The pulmonary vascularity is less engorged today. The lungs are well-expanded.  There is no focal infiltrate or pleural effusion. The power port appliance tip is stable projecting over the distal third of the SVC just proximal to the cavoatrial junction. There are stable coarse retrocardiac lung markings. There is multilevel degenerative disc disease of the thoracic spine. IMPRESSION: Improved appearance of the pulmonary vascularity and pulmonary interstitium consistent with resolution of pulmonary edema. Stable cardiomegaly. There is no evidence of pneumonia. Electronically Signed   By: David  Martinique M.D.   On: 11/26/2014 15:30   Ct Angio Chest W/cm &/or Wo Cm  11/30/2014  CLINICAL DATA:  Shortness of breath. EXAM: CT ANGIOGRAPHY CHEST WITH CONTRAST TECHNIQUE: Multidetector CT imaging of the chest was performed using the standard protocol during bolus administration of intravenous contrast. Multiplanar CT image reconstructions and MIPs were obtained to evaluate the vascular anatomy. CONTRAST:  146mL OMNIPAQUE IOHEXOL 350 MG/ML SOLN COMPARISON:  10/26/2014 FINDINGS: Mediastinum: Overall examination is suboptimal. Poor pulmonary arterial opacification and respiratory motion artifact diminishes exam detail. The heart size is moderately enlarged. No pericardial effusion. Prominent anterior mediastinal lymph nodes are again noted, unchanged from previous exam compatible with adenopathy. The trachea appears patent and is midline. Unremarkable appearance of the esophagus. Left upper lobe segmental pulmonary artery filling defect is identified, image 88 of series 14. Lungs/Pleura: No pleural effusion identified. There is atelectasis noted in both lung bases. No airspace consolidation. Upper Abdomen: The visualized portions of the liver are diffusely increased and attenuation. Calcified spleen noted. Musculoskeletal: Bony stigmata of sickle cell disease identified. Review of the MIP images confirms the above findings. IMPRESSION: 1. Exam detail diminished secondary to respiratory motion artifact  and poor pulmonary arterial opacification. 2. Examination is positive for at least 1 segmental branch pulmonary artery filling defect compatible with acute pulmonary embolus. 3. Chronic changes of sickle cell disease. Electronically Signed   By: Kerby Moors M.D.   On: 11/30/2014 10:28   Dg Chest Port 1 View  11/25/2014  CLINICAL DATA:  Current history of sickle cell disease, presenting with hypoxia. Recent diagnosis of pulmonary embolus. EXAM: PORTABLE CHEST 1 VIEW COMPARISON:  Chest x-ray 11/15/2014 and earlier. CTA chest 11/13/2014. CT chest 10/26/2014 and earlier. FINDINGS: Cardiac silhouette massively enlarged, unchanged. Widening of the superior mediastinum, unchanged, shown on prior CT to be due to anterior mediastinal lymphadenopathy. Stable scarring in the lung bases. Lungs otherwise clear. No localized airspace consolidation. No pleural effusions. No pneumothorax. Normal pulmonary vascularity. Left subclavian Port-A-Cath tip projects at the cavoatrial junction, unchanged. IMPRESSION: Stable massive cardiomegaly.  No acute cardiopulmonary disease. Electronically Signed   By: Evangeline Dakin M.D.   On: 11/25/2014 10:01    Medications  HYDROmorphone (DILAUDID) injection 1 mg (not administered)  sodium chloride 0.9 % bolus 1,000 mL (1,000 mLs Intravenous New Bag/Given 12/24/14 0439)  HYDROmorphone (DILAUDID) injection 2 mg (2 mg Intravenous Given 12/24/14 0439)  ketorolac (TORADOL) 30 MG/ML injection 30 mg (30 mg Intravenous Given 12/24/14 0439)  diphenhydrAMINE (BENADRYL) injection 25 mg (25 mg Intravenous Given 12/24/14 0439)  HYDROmorphone (DILAUDID) injection 2 mg (2 mg Intravenous Given 12/24/14 0617)   Wants to go to the clinic, signed out pending opening of clinic  I personally performed the services described in this documentation, which was scribed in my presence. The recorded information has been reviewed and is accurate.  Veatrice Kells, MD 12/24/14 [phone]

## 2014-12-24 NOTE — ED Notes (Signed)
Bed: [id number] Expected date:  Expected time:  Means of arrival:  Comments: EMS 34 yo male lower back and flank pain with left leg pain, sickle cell crisis

## 2014-12-24 NOTE — Discharge Instructions (Signed)
Sickle Cell Anemia, Adult Sickle cell anemia is a condition in which red blood cells have an abnormal "sickle" shape. This abnormal shape shortens the cells' life span, which results in a lower than normal concentration of red blood cells in the blood. The sickle shape also causes the cells to clump together and block free blood flow through the blood vessels. As a result, the tissues and organs of the body do not receive enough oxygen. Sickle cell anemia causes organ damage and pain and increases the risk of infection. CAUSES  Sickle cell anemia is a genetic disorder. Those who receive two copies of the gene have the condition, and those who receive one copy have the trait. RISK FACTORS The sickle cell gene is most common in people whose families originated in [place]. Other areas of the globe where sickle cell trait occurs include the [place], [place] and [place], the [place], and the [place].  SIGNS AND SYMPTOMS  Pain, especially in the extremities, back, chest, or abdomen (common). The pain may start suddenly or may develop following an illness, especially if there is dehydration. Pain can also occur due to overexertion or exposure to extreme temperature changes.  Frequent severe bacterial infections, especially certain types of pneumonia and meningitis.  Pain and swelling in the hands and feet.  Decreased activity.   Loss of appetite.   Change in behavior.  Headaches.  Seizures.  Shortness of breath or difficulty breathing.  Vision changes.  Skin ulcers. Those with the trait may not have symptoms or they may have mild symptoms.  DIAGNOSIS  Sickle cell anemia is diagnosed with blood tests that demonstrate the genetic trait. It is often diagnosed during the newborn period, due to mandatory testing nationwide. A variety of blood tests, X-rays, CT scans, MRI scans, ultrasounds, and lung function tests may also be done to monitor the condition. TREATMENT  Sickle  cell anemia may be treated with:  Medicines. You may be given pain medicines, antibiotic medicines (to treat and prevent infections) or medicines to increase the production of certain types of hemoglobin.  Fluids.  Oxygen.  Blood transfusions. HOME CARE INSTRUCTIONS   Drink enough fluid to keep your urine clear or pale yellow. Increase your fluid intake in hot weather and during exercise.  Do not smoke. Smoking lowers oxygen levels in the blood.   Only take over-the-counter or prescription medicines for pain, fever, or discomfort as directed by your health care provider.  Take antibiotics as directed by your health care provider. Make sure you finish them it even if you start to feel better.   Take supplements as directed by your health care provider.   Consider wearing a medical alert bracelet. This tells anyone caring for you in an emergency of your condition.   When traveling, keep your medical information, health care provider's names, and the medicines you take with you at all times.   If you develop a fever, do not take medicines to reduce the fever right away. This could cover up a problem that is developing. Notify your health care provider.  Keep all follow-up appointments with your health care provider. Sickle cell anemia requires regular medical care. SEEK MEDICAL CARE IF: You have a fever. SEEK IMMEDIATE MEDICAL CARE IF:   You feel dizzy or faint.   You have new abdominal pain, especially on the left side near the stomach area.   You develop a persistent, often uncomfortable and painful penile erection (priapism). If this is not treated immediately it   will lead to impotence.   You have numbness your arms or legs or you have a hard time moving them.   You have a hard time with speech.   You have a fever or persistent symptoms for more than 2-3 days.   You have a fever and your symptoms suddenly get worse.   You have signs or symptoms of infection.  These include:   Chills.   Abnormal tiredness (lethargy).   Irritability.   Poor eating.   Vomiting.   You develop pain that is not helped with medicine.   You develop shortness of breath.  You have pain in your chest.   You are coughing up pus-like or bloody sputum.   You develop a stiff neck.  Your feet or hands swell or have pain.  Your abdomen appears bloated.  You develop joint pain. MAKE SURE YOU:  Understand these instructions.   This information is not intended to replace advice given to you by your health care provider. Make sure you discuss any questions you have with your health care provider.   Document Released: 04/27/2005 Document Revised: 02/07/2014 Document Reviewed: 08/29/2012 Elsevier Interactive Patient Education 2016 [employer]  

## 2014-12-24 NOTE — ED Provider Notes (Signed)
Patient seen and examined here. He denies any chest pain or shortness of breath. He denies fever or chills. Patient did receive a lot of which explains his transient hypotension. Continues to endorse pain. States his current sickle cell pain is like his prior. Spoke with nurse practitioner at [facility]le cell clinic and patient be transferred there.  Lacretia Leigh, MD 12/24/14 [phone]

## 2014-12-24 NOTE — ED Notes (Signed)
Pt provided with sandwich and Coke per request.

## 2014-12-24 NOTE — Progress Notes (Signed)
Pt transported [facility] via w/c. Pt alert and oriented. Porta cath accessed, flushed and blood returned noted. Provider notified.

## 2014-12-24 NOTE — H&P (Signed)
[facility] History and Physical   Date: 12/24/2014  Patient name: [name] Medical record number: [id number] Date of birth: 25-Apr-1980 Age: 34 y.o. Gender: male PCP: [name], MD  Attending physician: Tresa Garter, MD  Chief Complaint: Bilateral Rib Pain  History of Present Illness: [name] is a 34 year old male with a history of sickle cell anemia,HbSS who presents to the day infusion center for pain related to sickle cell anemia. He was treated in the emergency department for pain prior to transitioning to the day infusion center. His current pain intensity is 9/10, unrelieved by dilaudid IV received earlier this am. Will admit Mr. [name] for treatment in the day infusion center for  current pain crisis. He denies any fever, chills, chest pain, shortness of breath, cough, abdominal pain, nausea, vomiting, or diarrhea.  Meds: Prescriptions prior to admission  Medication Sig Dispense Refill Last Dose  . aspirin 81 MG chewable tablet Chew 1 tablet (81 mg total) by mouth daily. 30 tablet 11 12/23/2014 at Unknown time  . Cholecalciferol (VITAMIN D) 2000 UNITS tablet Take 1 tablet (2,000 Units total) by mouth daily. 90 tablet 3 12/23/2014 at Unknown time  . enoxaparin (LOVENOX) 120 MG/0.8ML injection Inject 0.8 mLs (120 mg total) into the skin daily. 30 Syringe 2 12/23/2014 at Unknown time  . folic acid (FOLVITE) 1 MG tablet Take 1 tablet (1 mg total) by mouth every morning. 30 tablet 11 12/23/2014 at Unknown time  . HYDROmorphone (DILAUDID) 4 MG tablet Take 1 tablet (4 mg total) by mouth every 4 (four) hours as needed for severe pain. 90 tablet 0 12/24/2014 at 0000  . hydroxyurea (HYDREA) 500 MG capsule Take 1 capsule (500 mg total) by mouth daily. May take with food to minimize GI side effects. 30 capsule 0 12/23/2014 at Unknown time  . lisinopril (PRINIVIL,ZESTRIL) 10 MG tablet Take 1 tablet (10 mg total) by mouth daily. 30 tablet 1  12/23/2014 at Unknown time  . metoprolol succinate (TOPROL-XL) 50 MG 24 hr tablet Take 1 tablet (50 mg total) by mouth daily. 30 tablet 1 12/23/2014 at 0900  . morphine (MS CONTIN) 30 MG 12 hr tablet Take 1 tablet (30 mg total) by mouth every 12 (twelve) hours. 60 tablet 0 12/23/2014 at Unknown time  . potassium chloride SA (K-DUR,KLOR-CON) 20 MEQ tablet Take 1 tablet (20 mEq total) by mouth every morning. 30 tablet 3 12/23/2014 at Unknown time  . zolpidem (AMBIEN) 10 MG tablet Take 1 tablet (10 mg total) by mouth at bedtime as needed for sleep. 30 tablet 0 Past Week at Unknown time    Allergies: Review of patient's allergies indicates no known allergies. Past Medical History  Diagnosis Date  . Sickle cell anemia (HCC)   . Blood transfusion   . Acute embolism and thrombosis of right internal jugular vein ([place])   . Hypokalemia   . Mood disorder ([place])   . History of pulmonary embolus (PE)   . Avascular necrosis (HCC)     Right Hip  . Leukocytosis     Chronic  . Thrombocytosis (HCC)     Chronic  . Hypertension   . History of Clostridium difficile infection   . Uses marijuana   . Chronic anticoagulation   . Functional asplenia   . Former smoker   . Second hand tobacco smoke exposure   . Alcohol consumption of one to four drinks per day   . Noncompliance with medication regimen   . Sickle-cell  crisis with associated acute chest syndrome ([place]) 05/13/2013  . Acute chest syndrome ([place]) 06/18/2013  . Demand ischemia ([place]) 01/02/2014  . Pulmonary hypertension [facility])    Past Surgical History  Procedure Laterality Date  . Right hip replacement      08/2006  . Cholecystectomy      01/2008  . Porta cath placement    . Porta cath removal    . Umbilical hernia repair      01/2008  . Excision of left periauricular cyst      10/2009  . Excision of right ear lobe cyst with primary closur      11/2007  . Portacath placement  01/05/2012    Procedure: INSERTION PORT-A-CATH;  Surgeon: Odis Hollingshead, MD;  Location: [place];  Service: General;  Laterality: N/A;  ultrasound guiced port a cath insertion with fluoroscopy   Family History  Problem Relation Age of Onset  . Sickle cell trait Mother   . Depression Mother   . Diabetes Mother   . Sickle cell trait Father   . Sickle cell trait Brother    Social History   Social History  . Marital Status: Single    Spouse Name: N/A  . Number of Children: 0  . Years of Education: 13   Occupational History  . 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 Smoking status: Former Smoker -- 13 years  . Smokeless tobacco: Never Used  . Alcohol Use: No  . Drug Use: 2.00 per week    Special: Marijuana     Comment: Marijuana weekly  . Sexual Activity:    Partners: Female    Museum/gallery curator: None     Comment: month ago   Other Topics Concern  . Not on file   Social History Narrative   Lives in an apartment.  Single.  Lives alone but has a girlfriend that helps care for him.  Does not use any assist devices.        Gerald [id number]  [phone] Mom, emergency contact      [facility]:   Patient continuing to live in her apartment in town alone. 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    Review of Systems: Constitutional: negative except for fatigue Eyes: positive for icterus Ears, nose, mouth, throat, and face: negative Respiratory: positive for dyspnea on exertion Cardiovascular: negative Gastrointestinal: negative Genitourinary:negative Musculoskeletal:positive for arthralgias and back pain Neurological: negative Behavioral/Psych: negative Endocrine: negative Allergic/Immunologic: negative  Physical Exam: Blood pressure 101/68, pulse 77, temperature 98.4 F (36.9 C), resp. rate 13, height 6' (1.829 m), weight 165 lb (74.844 kg), SpO2 99 %. BP 101/68 mmHg  Pulse 77  Temp(Src) 98.4 F (36.9 C)  Resp 13  Ht 6' (1.829 m)  Wt 165 lb (74.844 kg)  BMI  22.37 kg/m2  SpO2 99%  General Appearance:    Alert, cooperative, mild distress, appears older than stated age  Head:    Normocephalic, without obvious abnormality, atraumatic  Eyes:    PERRL, conjunctiva/corneas clear, EOM's intact, scleral icterus bilaterally    Ears:    Normal TM's and external ear canals, both ears  Nose:   Nares normal, septum midline, mucosa normal, no drainage    or sinus tenderness  Throat:   Lips, mucosa, and tongue normal; teeth and gums normal  Neck:   Supple, symmetrical, trachea midline, no adenopathy;  thyroid:  No enlargement/tenderness/nodules; no carotid   bruit or JVD  Back:     Symmetric, no curvature, ROM normal, no CVA tenderness  Lungs:     Clear to auscultation bilaterally, respirations unlabored  Chest wall:    No tenderness or deformity. Left port a cath  Heart:    Irregular rate and regular rhythm  Abdomen:     Soft, non-tender, bowel sounds active all four quadrants,    no masses, no organomegaly  Extremities:   Extremities normal, atraumatic, Left anterior leg, trace edema with tenderness to palpation  Pulses:   2+ and symmetric all extremities  Skin:   Skin color, texture, turgor normal, no rashes or lesions  Lymph nodes:   Cervical, supraclavicular, and axillary nodes normal  Neurologic:   CNII-XII intact. Normal strength, sensation and reflexes      throughout    Lab results: Results for orders placed or performed during the hospital encounter of 12/24/14 (from the past 24 hour(s))  CBC with Differential/Platelet     Status: Abnormal   Collection Time: 12/24/14  4:10 AM  Result Value Ref Range   WBC 21.8 (H) 4.0 - 10.5 K/uL   RBC 2.30 (L) 4.22 - 5.81 MIL/uL   Hemoglobin 7.3 (L) 13.0 - 17.0 g/dL   HCT 21.4 (L) 39.0 - 52.0 %   MCV 93.0 78.0 - 100.0 fL   MCH 31.7 26.0 - 34.0 pg   MCHC 34.1 30.0 - 36.0 g/dL   RDW 19.1 (H) 11.5 - 15.5 %   Platelets 559 (H) 150 - 400 K/uL   Neutrophils Relative % 75 %   Lymphocytes Relative 11 %    Monocytes Relative 12 %   Eosinophils Relative 2 %   Basophils Relative 0 %   Neutro Abs 16.4 (H) 1.7 - 7.7 K/uL   Lymphs Abs 2.4 0.7 - 4.0 K/uL   Monocytes Absolute 2.6 (H) 0.1 - 1.0 K/uL   Eosinophils Absolute 0.4 0.0 - 0.7 K/uL   Basophils Absolute 0.0 0.0 - 0.1 K/uL   RBC Morphology POLYCHROMASIA PRESENT    Smear Review LARGE PLATELETS PRESENT   Reticulocytes     Status: Abnormal   Collection Time: 12/24/14  4:10 AM  Result Value Ref Range   Retic Ct Pct 15.1 (H) 0.4 - 3.1 %   RBC. 2.30 (L) 4.22 - 5.81 MIL/uL   Retic Count, Manual 347.3 (H) 19.0 - 186.0 K/uL  I-Stat Chem 8, ED     Status: Abnormal   Collection Time: 12/24/14  4:46 AM  Result Value Ref Range   Sodium 141 135 - 145 mmol/L   Potassium 3.6 3.5 - 5.1 mmol/L   Chloride 104 101 - 111 mmol/L   BUN 16 6 - 20 mg/dL   Creatinine, Ser 0.90 0.61 - 1.24 mg/dL   Glucose, Bld 105 (H) 65 - 99 mg/dL   Calcium, Ion 1.24 (H) 1.12 - 1.23 mmol/L   TCO2 21 0 - 100 mmol/L   Hemoglobin 7.8 (L) 13.0 - 17.0 g/dL   HCT 23.0 (L) 39.0 - 52.0 %   *Note: Due to a large number of results and/or encounters for the requested time period, some results have not been displayed. A complete set of results can be found in Results Review.    Imaging results:  No results found.   Assessment & Plan:  Patient will be admitted to the day infusion center for extended observation  Start IV D5.45 for cellular rehydration at 125/hr  Start  Toradol 30 mg IV times 1 for inflammation.  Start Dilaudid PCA High Concentration per weight based protocol.   Patient will be re-evaluated for pain intensity in the context of function and relationship to baseline as care progresses.  If no significant pain relief, will transfer patient to inpatient services for a higher level of care.   Reviewed labs; hemoglobin 7.8, patient asymptomatic, no transfusion warranted at this time.   Jorgia Manthei M 12/24/2014, 9:23 AM

## 2014-12-24 NOTE — Discharge Summary (Signed)
[facility] Discharge Summary   Patient ID: [name] MRN: [id number] DOB/AGE: 35-Oct-1982 35 y.o.  Admit date: 12/24/2014 Discharge date: 12/24/2014  Primary Care Physician:  [name], MD  Admission Diagnoses:  Active Problems:   Hb-SS disease with crisis [facility])   Discharge Medications:    Medication List    ASK your doctor about these medications        aspirin 81 MG chewable tablet  Chew 1 tablet (81 mg total) by mouth daily.     enoxaparin 120 MG/0.8ML injection  Commonly known as:  LOVENOX  Inject 0.8 mLs (120 mg total) into the skin daily.     folic acid 1 MG tablet  Commonly known as:  FOLVITE  Take 1 tablet (1 mg total) by mouth every morning.     HYDROmorphone 4 MG tablet  Commonly known as:  DILAUDID  Take 1 tablet (4 mg total) by mouth every 4 (four) hours as needed for severe pain.     hydroxyurea 500 MG capsule  Commonly known as:  HYDREA  Take 1 capsule (500 mg total) by mouth daily. May take with food to minimize GI side effects.     lisinopril 10 MG tablet  Commonly known as:  PRINIVIL,ZESTRIL  Take 1 tablet (10 mg total) by mouth daily.     metoprolol succinate 50 MG 24 hr tablet  Commonly known as:  TOPROL-XL  Take 1 tablet (50 mg total) by mouth daily.     morphine 30 MG 12 hr tablet  Commonly known as:  MS CONTIN  Take 1 tablet (30 mg total) by mouth every 12 (twelve) hours.     potassium chloride SA 20 MEQ tablet  Commonly known as:  K-DUR,KLOR-CON  Take 1 tablet (20 mEq total) by mouth every morning.     Vitamin D 2000 UNITS tablet  Take 1 tablet (2,000 Units total) by mouth daily.     zolpidem 10 MG tablet  Commonly known as:  AMBIEN  Take 1 tablet (10 mg total) by mouth at bedtime as needed for sleep.         Consults:  None  Significant Diagnostic Studies:  Dg Chest 2 View  12/06/2014  CLINICAL DATA:  Sickle cell crisis, 3 hours duration. Severe chest pain. EXAM: CHEST  2 VIEW  COMPARISON:  11/26/2014 FINDINGS: There is a left subclavian Port-A-Cath with tip in the low SVC. There is unchanged moderate cardiomegaly. Mild central vascular prominence is unchanged. No pleural effusions. No focal airspace consolidation. No pneumothorax. IMPRESSION: Unchanged cardiomegaly. No confluent airspace consolidation or pleural effusion. Electronically Signed   By: Andreas Newport M.D.   On: 12/06/2014 04:43   Dg Chest 2 View  11/26/2014  CLINICAL DATA:  Sickle cell crisis, shortness of breath. EXAM: CHEST  2 VIEW COMPARISON:  Chest x-ray of November 25, 2014 FINDINGS: The cardiopericardial silhouette remains enlarged. The pulmonary vascularity is less engorged today. The lungs are well-expanded. There is no focal infiltrate or pleural effusion. The power port appliance tip is stable projecting over the distal third of the SVC just proximal to the cavoatrial junction. There are stable coarse retrocardiac lung markings. There is multilevel degenerative disc disease of the thoracic spine. IMPRESSION: Improved appearance of the pulmonary vascularity and pulmonary interstitium consistent with resolution of pulmonary edema. Stable cardiomegaly. There is no evidence of pneumonia. Electronically Signed   By: David  Martinique M.D.   On: 11/26/2014 15:30   Ct Angio Chest W/cm &/or  Wo Cm  11/30/2014  CLINICAL DATA:  Shortness of breath. EXAM: CT ANGIOGRAPHY CHEST WITH CONTRAST TECHNIQUE: Multidetector CT imaging of the chest was performed using the standard protocol during bolus administration of intravenous contrast. Multiplanar CT image reconstructions and MIPs were obtained to evaluate the vascular anatomy. CONTRAST:  167mL OMNIPAQUE IOHEXOL 350 MG/ML SOLN COMPARISON:  10/26/2014 FINDINGS: Mediastinum: Overall examination is suboptimal. Poor pulmonary arterial opacification and respiratory motion artifact diminishes exam detail. The heart size is moderately enlarged. No pericardial effusion. Prominent  anterior mediastinal lymph nodes are again noted, unchanged from previous exam compatible with adenopathy. The trachea appears patent and is midline. Unremarkable appearance of the esophagus. Left upper lobe segmental pulmonary artery filling defect is identified, image 88 of series 14. Lungs/Pleura: No pleural effusion identified. There is atelectasis noted in both lung bases. No airspace consolidation. Upper Abdomen: The visualized portions of the liver are diffusely increased and attenuation. Calcified spleen noted. Musculoskeletal: Bony stigmata of sickle cell disease identified. Review of the MIP images confirms the above findings. IMPRESSION: 1. Exam detail diminished secondary to respiratory motion artifact and poor pulmonary arterial opacification. 2. Examination is positive for at least 1 segmental branch pulmonary artery filling defect compatible with acute pulmonary embolus. 3. Chronic changes of sickle cell disease. Electronically Signed   By: Kerby Moors M.D.   On: 11/30/2014 10:28   Dg Chest Port 1 View  11/25/2014  CLINICAL DATA:  Current history of sickle cell disease, presenting with hypoxia. Recent diagnosis of pulmonary embolus. EXAM: PORTABLE CHEST 1 VIEW COMPARISON:  Chest x-ray 11/15/2014 and earlier. CTA chest 11/13/2014. CT chest 10/26/2014 and earlier. FINDINGS: Cardiac silhouette massively enlarged, unchanged. Widening of the superior mediastinum, unchanged, shown on prior CT to be due to anterior mediastinal lymphadenopathy. Stable scarring in the lung bases. Lungs otherwise clear. No localized airspace consolidation. No pleural effusions. No pneumothorax. Normal pulmonary vascularity. Left subclavian Port-A-Cath tip projects at the cavoatrial junction, unchanged. IMPRESSION: Stable massive cardiomegaly.  No acute cardiopulmonary disease. Electronically Signed   By: Evangeline Dakin M.D.   On: 11/25/2014 10:01    [facility] Course: -Mr. Toops was admitted to  the day infusion center for extended observation.  -Started D5.45 for cellular re-hydration -Reviewed laboratory values, consistent with baseline -High Concentration PCA per weight based protocol for pain control. Patient used a total of 11.2 mg with 21 demands and 16 deliveries. He states that pain intensity is 5/10.  Patient was given Dilaudid 4 mg by mouth 30 minutes prior to discharge. He maintains that he can function at home on current medication regimen. Patient is alert, oriented and ambulatory. He is encouraged to continue hydrating and consistent on medication regimen.    Physical Exam at Discharge:   BP 119/83 mmHg  Pulse 99  Temp(Src) 98.5 F (36.9 C) (Oral)  Resp 19  Ht 6' (1.829 m)  Wt 165 lb (74.844 kg)  BMI 22.37 kg/m2  SpO2 95%   General Appearance:    Alert, cooperative, no distress, appears stated age  Eyes:    PERRL, conjunctiva/corneas clear, EOM's intact, fundi    benign, both eyes       Back:     Symmetric, no curvature, ROM normal, no CVA tenderness  Lungs:     Clear to auscultation bilaterally, respirations unlabored  Chest wall:    No tenderness or deformity  Heart:    Irregular rate and rhythm, S1 and S2 normal, no murmur, rub   or gallop  Lymph  nodes:   Cervical, supraclavicular, and axillary nodes normal  Neurologic:   CNII-XII intact. Normal strength, sensation and reflexes      throughout     Disposition at Discharge: 04-Intermediate Care Facility  Discharge Orders:   Condition at Discharge:   Stable  Time spent on Discharge:  15 minutes  Signed: Hymen Arnett M 12/24/2014, 12:42 PM

## 2014-12-24 NOTE — ED Notes (Signed)
Pt noted to be sleeping soundly at this time.

## 2014-12-24 NOTE — ED Notes (Signed)
Report given to Jana Half, RN and [place], Therapist, sports.

## 2014-12-26 ENCOUNTER — Inpatient Hospital Stay (HOSPITAL_COMMUNITY)
Admission: EM | Admit: 2014-12-26 | Discharge: 2015-01-01 | DRG: 812 | Disposition: A | Discharge status: Home / Self Care | Payer: Medicare Other | Attending: Internal Medicine | Admitting: Internal Medicine

## 2014-12-26 ENCOUNTER — Encounter (HOSPITAL_COMMUNITY): Payer: Self-pay | Admitting: *Deleted

## 2014-12-26 DIAGNOSIS — Z833 Family history of diabetes mellitus: Secondary | ICD-10-CM | POA: Diagnosis not present

## 2014-12-26 DIAGNOSIS — I272 Other secondary pulmonary hypertension: Secondary | ICD-10-CM | POA: Diagnosis present

## 2014-12-26 DIAGNOSIS — D638 Anemia in other chronic diseases classified elsewhere: Secondary | ICD-10-CM | POA: Diagnosis not present

## 2014-12-26 DIAGNOSIS — Z86711 Personal history of pulmonary embolism: Secondary | ICD-10-CM | POA: Diagnosis not present

## 2014-12-26 DIAGNOSIS — E876 Hypokalemia: Secondary | ICD-10-CM | POA: Diagnosis present

## 2014-12-26 DIAGNOSIS — J9611 Chronic respiratory failure with hypoxia: Secondary | ICD-10-CM | POA: Diagnosis present

## 2014-12-26 DIAGNOSIS — R0902 Hypoxemia: Secondary | ICD-10-CM | POA: Diagnosis present

## 2014-12-26 DIAGNOSIS — F39 Unspecified mood [affective] disorder: Secondary | ICD-10-CM | POA: Diagnosis present

## 2014-12-26 DIAGNOSIS — Z96641 Presence of right artificial hip joint: Secondary | ICD-10-CM | POA: Diagnosis present

## 2014-12-26 DIAGNOSIS — R0609 Other forms of dyspnea: Secondary | ICD-10-CM | POA: Diagnosis not present

## 2014-12-26 DIAGNOSIS — I2781 Cor pulmonale (chronic): Secondary | ICD-10-CM | POA: Diagnosis present

## 2014-12-26 DIAGNOSIS — Z832 Family history of diseases of the blood and blood-forming organs and certain disorders involving the immune mechanism: Secondary | ICD-10-CM | POA: Diagnosis not present

## 2014-12-26 DIAGNOSIS — R0600 Dyspnea, unspecified: Secondary | ICD-10-CM | POA: Diagnosis not present

## 2014-12-26 DIAGNOSIS — R0781 Pleurodynia: Secondary | ICD-10-CM | POA: Diagnosis not present

## 2014-12-26 DIAGNOSIS — G894 Chronic pain syndrome: Secondary | ICD-10-CM | POA: Diagnosis present

## 2014-12-26 DIAGNOSIS — D5700 Hb-SS disease with crisis, unspecified: Secondary | ICD-10-CM | POA: Diagnosis not present

## 2014-12-26 DIAGNOSIS — Z87891 Personal history of nicotine dependence: Secondary | ICD-10-CM | POA: Diagnosis not present

## 2014-12-26 DIAGNOSIS — Z818 Family history of other mental and behavioral disorders: Secondary | ICD-10-CM | POA: Diagnosis not present

## 2014-12-26 DIAGNOSIS — R52 Pain, unspecified: Secondary | ICD-10-CM | POA: Diagnosis not present

## 2014-12-26 DIAGNOSIS — Z9114 Patient's other noncompliance with medication regimen: Secondary | ICD-10-CM | POA: Diagnosis not present

## 2014-12-26 DIAGNOSIS — Z7722 Contact with and (suspected) exposure to environmental tobacco smoke (acute) (chronic): Secondary | ICD-10-CM | POA: Diagnosis present

## 2014-12-26 DIAGNOSIS — Z7901 Long term (current) use of anticoagulants: Secondary | ICD-10-CM | POA: Diagnosis not present

## 2014-12-26 DIAGNOSIS — Z7982 Long term (current) use of aspirin: Secondary | ICD-10-CM | POA: Diagnosis not present

## 2014-12-26 DIAGNOSIS — I10 Essential (primary) hypertension: Secondary | ICD-10-CM | POA: Diagnosis present

## 2014-12-26 DIAGNOSIS — I5022 Chronic systolic (congestive) heart failure: Secondary | ICD-10-CM | POA: Diagnosis present

## 2014-12-26 DIAGNOSIS — Z79891 Long term (current) use of opiate analgesic: Secondary | ICD-10-CM | POA: Diagnosis not present

## 2014-12-26 DIAGNOSIS — Z9981 Dependence on supplemental oxygen: Secondary | ICD-10-CM | POA: Diagnosis not present

## 2014-12-26 DIAGNOSIS — Z86718 Personal history of other venous thrombosis and embolism: Secondary | ICD-10-CM | POA: Diagnosis not present

## 2014-12-26 DIAGNOSIS — Z79899 Other long term (current) drug therapy: Secondary | ICD-10-CM | POA: Diagnosis not present

## 2014-12-26 DIAGNOSIS — D72829 Elevated white blood cell count, unspecified: Secondary | ICD-10-CM | POA: Diagnosis present

## 2014-12-26 LAB — I-STAT CHEM 8, ED
BUN: 7 mg/dL (ref 6–20)
Calcium, Ion: 1.23 mmol/L (ref 1.12–1.23)
Chloride: 107 mmol/L (ref 101–111)
Creatinine, Ser: 0.8 mg/dL (ref 0.61–1.24)
Glucose, Bld: 108 mg/dL — ABNORMAL HIGH (ref 65–99)
HEMATOCRIT: 23 % — AB (ref 39.0–52.0)
HEMOGLOBIN: 7.8 g/dL — AB (ref 13.0–17.0)
Potassium: 3.6 mmol/L (ref 3.5–5.1)
SODIUM: 143 mmol/L (ref 135–145)
TCO2: 20 mmol/L (ref 0–100)

## 2014-12-26 LAB — RETICULOCYTES
RBC.: 2.18 MIL/uL — ABNORMAL LOW (ref 4.22–5.81)
RBC.: 2.16 MIL/uL — AB (ref 4.22–5.81)
RETIC COUNT ABSOLUTE: 281.2 10*3/uL — AB (ref 19.0–186.0)
RETIC COUNT ABSOLUTE: 263.5 10*3/uL — AB (ref 19.0–186.0)
RETIC CT PCT: 12.9 % — AB (ref 0.4–3.1)
Retic Ct Pct: 12.2 % — ABNORMAL HIGH (ref 0.4–3.1)

## 2014-12-26 LAB — CBC WITH DIFFERENTIAL/PLATELET
BASOS ABS: 0.2 10*3/uL — AB (ref 0.0–0.1)
Basophils Relative: 1 %
EOS ABS: 0.6 10*3/uL (ref 0.0–0.7)
Eosinophils Relative: 3 %
HEMATOCRIT: 20.2 % — AB (ref 39.0–52.0)
Hemoglobin: 6.9 g/dL — CL (ref 13.0–17.0)
LYMPHS PCT: 15 %
Lymphs Abs: 3 10*3/uL (ref 0.7–4.0)
MCH: 31.7 pg (ref 26.0–34.0)
MCHC: 34.2 g/dL (ref 30.0–36.0)
MCV: 92.7 fL (ref 78.0–100.0)
MONO ABS: 2.8 10*3/uL — AB (ref 0.1–1.0)
Monocytes Relative: 14 %
NEUTROS PCT: 67 %
NRBC: 3 /100{WBCs} — AB
Neutro Abs: 13.1 10*3/uL — ABNORMAL HIGH (ref 1.7–7.7)
PLATELETS: 513 10*3/uL — AB (ref 150–400)
RBC: 2.18 MIL/uL — AB (ref 4.22–5.81)
RDW: 18.8 % — ABNORMAL HIGH (ref 11.5–15.5)
WBC: 19.7 10*3/uL — AB (ref 4.0–10.5)

## 2014-12-26 LAB — MAGNESIUM: MAGNESIUM: 1.7 mg/dL (ref 1.7–2.4)

## 2014-12-26 LAB — TYPE AND SCREEN
ABO/RH(D): O POS
ANTIBODY SCREEN: NEGATIVE

## 2014-12-26 LAB — LACTATE DEHYDROGENASE: LDH: 430 U/L — AB (ref 98–192)

## 2014-12-26 MED ORDER — NALOXONE HCL 0.4 MG/ML IJ SOLN: 0.4000 mg | INTRAMUSCULAR | Status: DC | PRN

## 2014-12-26 MED ORDER — HYDROMORPHONE HCL 1 MG/ML IJ SOLN
1.0000 mg | Freq: Once | INTRAMUSCULAR | Status: AC
  Administered 2014-12-26: 1 mg via INTRAVENOUS
  Filled 2014-12-26: qty 1

## 2014-12-26 MED ORDER — ZOLPIDEM TARTRATE 10 MG PO TABS
10.0000 mg | ORAL_TABLET | Freq: Every evening | ORAL | Status: DC | PRN
  Administered 2014-12-26 – 2015-01-01 (×6): 10 mg via ORAL
  Filled 2014-12-26 (×6): qty 1

## 2014-12-26 MED ORDER — METOPROLOL SUCCINATE ER 50 MG PO TB24
50.0000 mg | ORAL_TABLET | Freq: Every day | ORAL | Status: DC
  Administered 2014-12-27 – 2015-01-01 (×6): 50 mg via ORAL
  Filled 2014-12-26 (×6): qty 1

## 2014-12-26 MED ORDER — DEXTROSE-NACL 5-0.45 % IV SOLN
INTRAVENOUS | Status: DC
  Administered 2014-12-26 – 2014-12-29 (×7): via INTRAVENOUS
  Administered 2014-12-29: 1000 mL via INTRAVENOUS
  Administered 2014-12-31: 04:00:00 via INTRAVENOUS

## 2014-12-26 MED ORDER — HYDROMORPHONE HCL 1 MG/ML IJ SOLN
0.5000 mg | Freq: Once | INTRAMUSCULAR | Status: AC
  Administered 2014-12-26: 0.5 mg via INTRAVENOUS
  Filled 2014-12-26: qty 1

## 2014-12-26 MED ORDER — POLYETHYLENE GLYCOL 3350 17 G PO PACK: 17.0000 g | PACK | Freq: Every day | ORAL | Status: DC | PRN

## 2014-12-26 MED ORDER — DIPHENHYDRAMINE HCL 50 MG/ML IJ SOLN
25.0000 mg | Freq: Once | INTRAMUSCULAR | Status: AC
Start: 2014-12-26 — End: 2014-12-26
  Administered 2014-12-26: 25 mg via INTRAVENOUS
  Filled 2014-12-26: qty 1

## 2014-12-26 MED ORDER — FOLIC ACID 1 MG PO TABS
1.0000 mg | ORAL_TABLET | Freq: Every morning | ORAL | Status: DC
  Administered 2014-12-27 – 2015-01-01 (×6): 1 mg via ORAL
  Filled 2014-12-26 (×6): qty 1

## 2014-12-26 MED ORDER — SODIUM CHLORIDE 0.9 % IV SOLN
12.5000 mg | Freq: Four times a day (QID) | INTRAVENOUS | Status: DC | PRN
  Administered 2014-12-26 – 2014-12-28 (×4): 12.5 mg via INTRAVENOUS
  Filled 2014-12-26 (×13): qty 0.25

## 2014-12-26 MED ORDER — LISINOPRIL 10 MG PO TABS
10.0000 mg | ORAL_TABLET | Freq: Every day | ORAL | Status: DC
  Administered 2014-12-27 – 2015-01-01 (×6): 10 mg via ORAL
  Filled 2014-12-26 (×6): qty 1

## 2014-12-26 MED ORDER — DIPHENHYDRAMINE HCL 12.5 MG/5ML PO ELIX: 12.5000 mg | ORAL_SOLUTION | Freq: Four times a day (QID) | ORAL | Status: DC | PRN

## 2014-12-26 MED ORDER — MORPHINE SULFATE ER 30 MG PO TBCR
30.0000 mg | EXTENDED_RELEASE_TABLET | Freq: Two times a day (BID) | ORAL | Status: DC
  Administered 2014-12-26 – 2015-01-01 (×12): 30 mg via ORAL
  Filled 2014-12-26 (×12): qty 1

## 2014-12-26 MED ORDER — VITAMIN D3 25 MCG (1000 UNIT) PO TABS
2000.0000 [IU] | ORAL_TABLET | Freq: Every day | ORAL | Status: DC
  Administered 2014-12-27 – 2015-01-01 (×6): 2000 [IU] via ORAL
  Filled 2014-12-26 (×13): qty 2

## 2014-12-26 MED ORDER — ASPIRIN 81 MG PO CHEW
81.0000 mg | CHEWABLE_TABLET | Freq: Every day | ORAL | Status: DC
  Administered 2014-12-27 – 2015-01-01 (×6): 81 mg via ORAL
  Filled 2014-12-26 (×6): qty 1

## 2014-12-26 MED ORDER — KETOROLAC TROMETHAMINE 30 MG/ML IJ SOLN
30.0000 mg | Freq: Once | INTRAMUSCULAR | Status: AC
  Administered 2014-12-26: 30 mg via INTRAVENOUS
  Filled 2014-12-26: qty 1

## 2014-12-26 MED ORDER — HYDROMORPHONE HCL 2 MG/ML IJ SOLN
2.0000 mg | Freq: Once | INTRAMUSCULAR | Status: AC
  Administered 2014-12-26: 2 mg via INTRAVENOUS
  Filled 2014-12-26: qty 1

## 2014-12-26 MED ORDER — SODIUM CHLORIDE 0.9 % IJ SOLN: 9.0000 mL | INTRAMUSCULAR | Status: DC | PRN

## 2014-12-26 MED ORDER — ONDANSETRON HCL 4 MG/2ML IJ SOLN: 4.0000 mg | Freq: Four times a day (QID) | INTRAMUSCULAR | Status: DC | PRN

## 2014-12-26 MED ORDER — SENNOSIDES-DOCUSATE SODIUM 8.6-50 MG PO TABS
1.0000 | ORAL_TABLET | Freq: Two times a day (BID) | ORAL | Status: DC
  Administered 2014-12-26 – 2015-01-01 (×11): 1 via ORAL
  Filled 2014-12-26 (×12): qty 1

## 2014-12-26 MED ORDER — ENOXAPARIN SODIUM 120 MG/0.8ML ~~LOC~~ SOLN
120.0000 mg | SUBCUTANEOUS | Status: DC
  Administered 2014-12-26 – 2014-12-31 (×6): 120 mg via SUBCUTANEOUS
  Filled 2014-12-26 (×9): qty 0.8

## 2014-12-26 MED ORDER — HYDROXYUREA 500 MG PO CAPS
500.0000 mg | ORAL_CAPSULE | Freq: Every day | ORAL | Status: DC
  Administered 2014-12-27 – 2014-12-28 (×2): 500 mg via ORAL
  Filled 2014-12-26 (×2): qty 1

## 2014-12-26 MED ORDER — HYDROMORPHONE 1 MG/ML IV SOLN
INTRAVENOUS | Status: DC
  Administered 2014-12-26: 15:00:00 via INTRAVENOUS
  Administered 2014-12-26: 12.5 mg via INTRAVENOUS
  Administered 2014-12-27: 6 mg via INTRAVENOUS
  Administered 2014-12-27: 5.5 mg via INTRAVENOUS
  Administered 2014-12-27: 05:00:00 via INTRAVENOUS
  Administered 2014-12-27: 3 mg via INTRAVENOUS
  Administered 2014-12-27: 2 mg via INTRAVENOUS
  Administered 2014-12-27: 3 mg via INTRAVENOUS
  Administered 2014-12-27 – 2014-12-28 (×2): 5 mg via INTRAVENOUS
  Administered 2014-12-28: 8 mg via INTRAVENOUS
  Administered 2014-12-28: 5 mg via INTRAVENOUS
  Administered 2014-12-28: 9.5 mg via INTRAVENOUS
  Administered 2014-12-28: 8.5 mg via INTRAVENOUS
  Administered 2014-12-28: 4.5 mg via INTRAVENOUS
  Administered 2014-12-29: 1.59 mg via INTRAVENOUS
  Administered 2014-12-29: 2.5 mg via INTRAVENOUS
  Administered 2014-12-29: 03:00:00 via INTRAVENOUS
  Administered 2014-12-29: 4 mg via INTRAVENOUS
  Administered 2014-12-29: 2 mg via INTRAVENOUS
  Filled 2014-12-26 (×5): qty 25

## 2014-12-26 MED ORDER — SODIUM CHLORIDE 0.9 % IV BOLUS (SEPSIS)
1000.0000 mL | Freq: Once | INTRAVENOUS | Status: AC
  Administered 2014-12-26: 1000 mL via INTRAVENOUS

## 2014-12-26 MED ORDER — POTASSIUM CHLORIDE CRYS ER 20 MEQ PO TBCR
20.0000 meq | EXTENDED_RELEASE_TABLET | Freq: Every morning | ORAL | Status: DC
Start: 2014-12-26 — End: 2015-01-01
  Administered 2014-12-27 – 2015-01-01 (×6): 20 meq via ORAL
  Filled 2014-12-26 (×6): qty 1

## 2014-12-26 NOTE — ED Notes (Signed)
Critical Hgb 6.9 Read back and verified with Larene Beach in lab Primary nurse notified

## 2014-12-26 NOTE — ED Notes (Signed)
EKG given to EDP,Palumbo,MD., for review. 

## 2014-12-26 NOTE — ED Notes (Signed)
Pulse Oximetry while ambulating : 80%

## 2014-12-26 NOTE — ED Notes (Signed)
EMS called to home.  Found patient with complaints of sickle cell crisis pain. Currently the patient is complaining of bilateral rib pain.  Patient also currently  Has a PE that he has had for a year.  Currently he rates his pain 9 of 10.

## 2014-12-26 NOTE — ED Notes (Addendum)
Attempted to call report was informed that the room was still dirty and could not bring the patient. Report not taken.

## 2014-12-26 NOTE — H&P (Signed)
[name] is an 34 y.o. male.   Chief Complaint: Pain all over HPI: A 34 year old gentleman with known history of sickle cell disease who has been in and out of the hospital several times this year presented to the ER with pain in his back and legs rated as 10 out of 10. He was in the sickle cell day hospital 2 days ago where he was treated and sent home. He apparently felt better but yesterday the pain came back. He came back to the ER today where the pain is escalating. Worsened by all movement and not relieved by anything. The sickle cell day hospital is close today so patient is not able to go therefore treatment. He also has shortness of breath but nothing beyond baseline. He is hypoxemic on room air but is known to be on home oxygen. No cough no fever  Past Medical History  Diagnosis Date  . Sickle cell anemia (HCC)   . Blood transfusion   . Acute embolism and thrombosis of right internal jugular vein ([place])   . Hypokalemia   . Mood disorder ([place])   . History of pulmonary embolus (PE)   . Avascular necrosis (HCC)     Right Hip  . Leukocytosis     Chronic  . Thrombocytosis (HCC)     Chronic  . Hypertension   . History of Clostridium difficile infection   . Uses marijuana   . Chronic anticoagulation   . Functional asplenia   . Former smoker   . Second hand tobacco smoke exposure   . Alcohol consumption of one to four drinks per day   . Noncompliance with medication regimen   . Sickle-cell crisis with associated acute chest syndrome ([place]) 05/13/2013  . Acute chest syndrome ([place]) 06/18/2013  . Demand ischemia ([place]) 01/02/2014  . Pulmonary hypertension [facility])     Past Surgical History  Procedure Laterality Date  . Right hip replacement      08/2006  . Cholecystectomy      01/2008  . Porta cath placement    . Porta cath removal    . Umbilical hernia repair      01/2008  . Excision of left periauricular cyst      10/2009  . Excision of right ear lobe cyst with primary  closur      11/2007  . Portacath placement  01/05/2012    Procedure: INSERTION PORT-A-CATH;  Surgeon: Odis Hollingshead, MD;  Location: [facility];  Service: General;  Laterality: N/A;  ultrasound guiced port a cath insertion with fluoroscopy    Family History  Problem Relation Age of Onset  . Sickle cell trait Mother   . Depression Mother   . Diabetes Mother   . Sickle cell trait Father   . Sickle cell trait Brother    Social History:  reports that he has quit smoking. He has never used smokeless tobacco. He reports that he uses illicit drugs (Marijuana) about twice per week. He reports that he does not drink alcohol.  Allergies: No Known Allergies   (Not in a hospital admission)  Results for orders placed or performed during the hospital encounter of 12/26/14 (from the past 48 hour(s))  CBC with Differential     Status: Abnormal   Collection Time: 12/26/14  7:00 AM  Result Value Ref Range   WBC 19.7 (H) 4.0 - 10.5 K/uL    Comment: ADJUSTED FOR NUCLEATED RBC'S   RBC 2.18 (L) 4.22 - 5.81 MIL/uL  Hemoglobin 6.9 (LL) 13.0 - 17.0 g/dL    Comment: REPEATED TO VERIFY CRITICAL RESULT CALLED TO, READ BACK BY AND VERIFIED WITH: WARD,A @ 0813 ON [id number] BY POTEAT,S    HCT 20.2 (L) 39.0 - 52.0 %   MCV 92.7 78.0 - 100.0 fL   MCH 31.7 26.0 - 34.0 pg   MCHC 34.2 30.0 - 36.0 g/dL   RDW 18.8 (H) 11.5 - 15.5 %   Platelets 513 (H) 150 - 400 K/uL   Neutrophils Relative % 67 %   Lymphocytes Relative 15 %   Monocytes Relative 14 %   Eosinophils Relative 3 %   Basophils Relative 1 %   nRBC 3 (H) 0 /100 WBC   Neutro Abs 13.1 (H) 1.7 - 7.7 K/uL   Lymphs Abs 3.0 0.7 - 4.0 K/uL   Monocytes Absolute 2.8 (H) 0.1 - 1.0 K/uL   Eosinophils Absolute 0.6 0.0 - 0.7 K/uL   Basophils Absolute 0.2 (H) 0.0 - 0.1 K/uL   RBC Morphology POLYCHROMASIA PRESENT     Comment: TARGET CELLS HOWELL/JOLLY BODIES SICKLE CELLS RARE NRBCs    WBC Morphology VACUOLATED NEUTROPHILS    Smear Review LARGE PLATELETS  PRESENT   Reticulocytes     Status: Abnormal   Collection Time: 12/26/14  7:00 AM  Result Value Ref Range   Retic Ct Pct 12.9 (H) 0.4 - 3.1 %   RBC. 2.18 (L) 4.22 - 5.81 MIL/uL   Retic Count, Manual 281.2 (H) 19.0 - 186.0 K/uL  I-stat Chem 8, ED     Status: Abnormal   Collection Time: 12/26/14  7:12 AM  Result Value Ref Range   Sodium 143 135 - 145 mmol/L   Potassium 3.6 3.5 - 5.1 mmol/L   Chloride 107 101 - 111 mmol/L   BUN 7 6 - 20 mg/dL   Creatinine, Ser 0.80 0.61 - 1.24 mg/dL   Glucose, Bld 108 (H) 65 - 99 mg/dL   Calcium, Ion 1.23 1.12 - 1.23 mmol/L   TCO2 20 0 - 100 mmol/L   Hemoglobin 7.8 (L) 13.0 - 17.0 g/dL   HCT 23.0 (L) 39.0 - 52.0 %   *Note: Due to a large number of results and/or encounters for the requested time period, some results have not been displayed. A complete set of results can be found in Results Review.   No results found.  Review of Systems  Constitutional: Negative.   HENT: Negative.   Eyes: Negative.   Respiratory: Negative.   Cardiovascular: Negative.   Gastrointestinal: Negative.   Genitourinary: Negative.   Musculoskeletal: Positive for myalgias, back pain and joint pain.  Skin: Negative.   Neurological: Negative.   Psychiatric/Behavioral: Negative.     Blood pressure 108/80, pulse 71, temperature 98.5 F (36.9 C), temperature source Oral, resp. rate 18, SpO2 97 %. Physical Exam  Constitutional: He is oriented to person, place, and time. He appears well-developed and well-nourished.  HENT:  Head: Normocephalic and atraumatic.  Right Ear: External ear normal.  Eyes: Conjunctivae are normal.  Neck: Normal range of motion. Neck supple.  Cardiovascular: Normal rate.   Respiratory: Effort normal and breath sounds normal.  GI: Soft. Bowel sounds are normal.  Musculoskeletal: Normal range of motion.  Neurological: He is alert and oriented to person, place, and time.  Skin: Skin is warm and dry.  Psychiatric: He has a normal mood and  affect.     Assessment/Plan A 34 year old gentleman admitted with sickle cell painful crisis #1 sickle cell painful crisis:  Patient will be admitted for pain management. I will start him on Dilaudid PCA with Toradol and IV fluids. Place patient on his long-acting morphine. He is opiates tolerant. #2 sickle cell anemia: Hemoglobin is at baseline. Continue to monitor. #3 chronic hypoxemia: Patient on 2 L of oxygen at home. He uses it on and off. He has cor pulmonale. Continue oxygen in the hospital #4 chronic anticoagulation: He is on Lovenox for anticoagulation. Continue in the hospital  [facility] 12/26/2014, 12:28 PM

## 2014-12-26 NOTE — ED Provider Notes (Signed)
CSN: [id number]     Arrival date & time 12/26/14  0358 History   First MD Initiated Contact with Patient 12/26/14 [phone]     Chief Complaint  Patient presents with  . Sickle Cell Pain Crisis   HPI   [name] is an 34 y.o. male with history of SCD who presents to the ED for evaluation of possible sickle cell pain crisis. He is well known to this ED. He was last seen here two days ago and sent to the [facility]. Pt was discharged yesterday. He states he took PO dilaudid at home ~1AM today but continues to have 10/10 severe lower rib and low back pain. States this feels similar to previous episodes. Denies new chest pain, SOB, fever, chills, abd pain, N/V/D. Denies night sweats, bowel/bladder incontinence, saddle paresthesias. He states he has not been using his supplemental oxygen regularly at home. SpO2 initially 91% on RA improved to 99% with O2 via [place].   Past Medical History  Diagnosis Date  . Sickle cell anemia (HCC)   . Blood transfusion   . Acute embolism and thrombosis of right internal jugular vein ([place])   . Hypokalemia   . Mood disorder ([place])   . History of pulmonary embolus (PE)   . Avascular necrosis (HCC)     Right Hip  . Leukocytosis     Chronic  . Thrombocytosis (HCC)     Chronic  . Hypertension   . History of Clostridium difficile infection   . Uses marijuana   . Chronic anticoagulation   . Functional asplenia   . Former smoker   . Second hand tobacco smoke exposure   . Alcohol consumption of one to four drinks per day   . Noncompliance with medication regimen   . Sickle-cell crisis with associated acute chest syndrome ([place]) 05/13/2013  . Acute chest syndrome ([place]) 06/18/2013  . Demand ischemia ([place]) 01/02/2014  . Pulmonary hypertension [facility])    Past Surgical History  Procedure Laterality Date  . Right hip replacement      08/2006  . Cholecystectomy      01/2008  . Porta cath placement    . Porta cath removal    . Umbilical hernia repair      01/2008   . Excision of left periauricular cyst      10/2009  . Excision of right ear lobe cyst with primary closur      11/2007  . Portacath placement  01/05/2012    Procedure: INSERTION PORT-A-CATH;  Surgeon: Odis Hollingshead, MD;  Location: [facility];  Service: General;  Laterality: N/A;  ultrasound guiced port a cath insertion with fluoroscopy   Family History  Problem Relation Age of Onset  . Sickle cell trait Mother   . Depression Mother   . Diabetes Mother   . Sickle cell trait Father   . Sickle cell trait Brother    Social History  Substance Use Topics  . Smoking status: Former Smoker -- 13 years  . Smokeless tobacco: Never Used  . Alcohol Use: No    Review of Systems  All other systems reviewed and are negative.     Allergies  Review of patient's allergies indicates no known allergies.  Home Medications   Prior to Admission medications   Medication Sig Start Date End Date Taking? Authorizing Provider  aspirin 81 MG chewable tablet Chew 1 tablet (81 mg total) by mouth daily. 07/23/14  Yes [name], MD  Cholecalciferol (VITAMIN D) 2000  UNITS tablet Take 1 tablet (2,000 Units total) by mouth daily. 02/13/14  Yes [name], MD  enoxaparin (LOVENOX) 120 MG/0.8ML injection Inject 0.8 mLs (120 mg total) into the skin daily. 12/03/14  Yes [name], MD  folic acid (FOLVITE) 1 MG tablet Take 1 tablet (1 mg total) by mouth every morning. 10/08/13  Yes [name], MD  HYDROmorphone (DILAUDID) 4 MG tablet Take 1 tablet (4 mg total) by mouth every 4 (four) hours as needed for severe pain. 12/23/14  Yes Dorena Dew, FNP  hydroxyurea (HYDREA) 500 MG capsule Take 1 capsule (500 mg total) by mouth daily. May take with food to minimize GI side effects. 11/06/14  Yes Dorena Dew, FNP  lisinopril (PRINIVIL,ZESTRIL) 10 MG tablet Take 1 tablet (10 mg total) by mouth daily. 01/22/14  Yes [name], MD  metoprolol succinate (TOPROL-XL) 50 MG 24 hr  tablet Take 1 tablet (50 mg total) by mouth daily. 11/28/14  Yes [name], MD  morphine (MS CONTIN) 30 MG 12 hr tablet Take 1 tablet (30 mg total) by mouth every 12 (twelve) hours. 12/23/14  Yes Dorena Dew, FNP  potassium chloride SA (K-DUR,KLOR-CON) 20 MEQ tablet Take 1 tablet (20 mEq total) by mouth every morning. 06/09/14  Yes [name], MD  zolpidem (AMBIEN) 10 MG tablet Take 1 tablet (10 mg total) by mouth at bedtime as needed for sleep. 12/23/14  Yes Dorena Dew, FNP   BP 128/89 mmHg  Pulse 96  Temp(Src) 98.4 F (36.9 C) (Oral)  Resp 18  SpO2 99% Physical Exam  Constitutional: He is oriented to person, place, and time. No distress.  NAD. Appears chronically ill.  HENT:  Right Ear: External ear normal.  Left Ear: External ear normal.  Nose: Nose normal.  Mouth/Throat: Oropharynx is clear and moist. No oropharyngeal exudate.  Eyes: Conjunctivae and EOM are normal. Pupils are equal, round, and reactive to light.  Neck: Normal range of motion. Neck supple.  Cardiovascular: Normal rate, regular rhythm, normal heart sounds and intact distal pulses.   Pulmonary/Chest: Effort normal and breath sounds normal. No respiratory distress. He has no wheezes. He exhibits tenderness.  Tenderness along lower ribs bilaterally  Abdominal: Soft. Bowel sounds are normal. He exhibits no distension. There is no tenderness.  Musculoskeletal: Normal range of motion. He exhibits no edema.  Diffuse low back tenderness. FROM. UE and LE strength and sensation intact.   Neurological: He is alert and oriented to person, place, and time. No cranial nerve deficit.  Skin: Skin is warm and dry. He is not diaphoretic.  Psychiatric: He has a normal mood and affect.  Nursing note and vitals reviewed.   ED Course  Procedures (including critical care time) Labs Review Labs Reviewed  CBC WITH DIFFERENTIAL/PLATELET - Abnormal; Notable for the following:    WBC 19.7 (*)    RBC 2.18 (*)     Hemoglobin 6.9 (*)    HCT 20.2 (*)    RDW 18.8 (*)    Platelets 513 (*)    Neutro Abs 13.1 (*)    Monocytes Absolute 2.8 (*)    Basophils Absolute 0.2 (*)    All other components within normal limits  RETICULOCYTES - Abnormal; Notable for the following:    Retic Ct Pct 12.9 (*)    RBC. 2.18 (*)    Retic Count, Manual 281.2 (*)    All other components within normal limits  I-STAT CHEM 8, ED -  Abnormal; Notable for the following:    Glucose, Bld 108 (*)    Hemoglobin 7.8 (*)    HCT 23.0 (*)    All other components within normal limits    Imaging Review No results found. I have personally reviewed and evaluated these images and lab results as part of my medical decision-making.   EKG Interpretation   Date/Time:  Friday December 26 2014 04:10:31 EST Ventricular Rate:  90 PR Interval:  186 QRS Duration: 114 QT Interval:  387 QTC Calculation: 473 R Axis:   -76 Text Interpretation:  Sinus rhythm Left atrial enlargement Incomplete  right bundle branch block Inferior infarct, age indeterminate Confirmed by  [facility]  MD, APRIL ([id number]) on 12/26/2014 4:25:42 AM      MDM   Final diagnoses:  Sickle cell pain crisis ([place])    Pt well known to ED and sickle cell clinic. WBC 19.7 today but pt has chronically elevated, though this appears to be more elevated than baseline. He has anemia of chronic disease but hgb is lower than baseline as well at 6.9. Pt maintaining SpO2 99% with supplemental O2. He is breathing comfortably during our entire exam and on my reassessment. Will ambulate on RA. Pt continues to endorse significant pain even after 2mg  dilaudid, 25mg  benadryl, and 30mg  toradol. The sickle cell clinic is closed today. I suspect we will have to admit pt if we cannot establish good pain control and if he is hypoxic given his hgb lower than baseline.  Pt de-sat to 80% on RA with ambulation. Replaced [place] for O2 supplementation. This is chronic for pt however he does not use  supplemental O2 at home. Will continue to monitor pain. Likely will call sickle cell team for admission due to intractable pain.  Pt continues to endorse 8/10 pain. He wants to be admitted for pain control. Total of 3mg  dilaudid, 30 toradol, and 25 benadryl given in the ED. Spoke to Dr. Mcarthur Rossetti of sickle cell team, will admit for intractable pain.   Anne Ng, PA-C 12/26/14 [place], MD 12/27/14 1240

## 2014-12-27 LAB — COMPREHENSIVE METABOLIC PANEL
ALBUMIN: 3.9 g/dL (ref 3.5–5.0)
ALT: 25 U/L (ref 17–63)
AST: 38 U/L (ref 15–41)
Alkaline Phosphatase: 66 U/L (ref 38–126)
Anion gap: 5 (ref 5–15)
BUN: 7 mg/dL (ref 6–20)
CO2: 23 mmol/L (ref 22–32)
Calcium: 8.4 mg/dL — ABNORMAL LOW (ref 8.9–10.3)
Chloride: 107 mmol/L (ref 101–111)
Creatinine, Ser: 0.59 mg/dL — ABNORMAL LOW (ref 0.61–1.24)
GFR calc Af Amer: >60 mL/min (ref >60)
GFR calc non Af Amer: >60 mL/min (ref >60)
Glucose, Bld: 110 mg/dL — ABNORMAL HIGH (ref 65–99)
POTASSIUM: 3.4 mmol/L — AB (ref 3.5–5.1)
SODIUM: 135 mmol/L (ref 135–145)
TOTAL PROTEIN: 7.1 g/dL (ref 6.5–8.1)
Total Bilirubin: 4.3 mg/dL — ABNORMAL HIGH (ref 0.3–1.2)

## 2014-12-27 LAB — CBC WITH DIFFERENTIAL/PLATELET
BASOS ABS: 0.2 10*3/uL — AB (ref 0.0–0.1)
Basophils Relative: 1 %
Eosinophils Absolute: 0.6 10*3/uL (ref 0.0–0.7)
Eosinophils Relative: 3 %
HEMATOCRIT: 19.1 % — AB (ref 39.0–52.0)
Hemoglobin: 6.3 g/dL — CL (ref 13.0–17.0)
LYMPHS ABS: 3.9 10*3/uL (ref 0.7–4.0)
Lymphocytes Relative: 19 %
MCH: 30.4 pg (ref 26.0–34.0)
MCHC: 33 g/dL (ref 30.0–36.0)
MCV: 92.3 fL (ref 78.0–100.0)
MONO ABS: 2.4 10*3/uL — AB (ref 0.1–1.0)
MONOS PCT: 12 %
NEUTROS PCT: 65 %
Neutro Abs: 13.2 10*3/uL — ABNORMAL HIGH (ref 1.7–7.7)
PLATELETS: 477 10*3/uL — AB (ref 150–400)
RBC: 2.07 MIL/uL — AB (ref 4.22–5.81)
RDW: 19.2 % — AB (ref 11.5–15.5)
WBC: 20.3 10*3/uL — AB (ref 4.0–10.5)

## 2014-12-27 MED ORDER — KETOROLAC TROMETHAMINE 30 MG/ML IJ SOLN
30.0000 mg | Freq: Once | INTRAMUSCULAR | Status: AC
  Administered 2014-12-27: 30 mg via INTRAVENOUS
  Filled 2014-12-27: qty 1

## 2014-12-27 MED ORDER — POTASSIUM CHLORIDE CRYS ER 20 MEQ PO TBCR
20.0000 meq | EXTENDED_RELEASE_TABLET | Freq: Once | ORAL | Status: AC
  Administered 2014-12-27: 20 meq via ORAL
  Filled 2014-12-27: qty 1

## 2014-12-27 MED ORDER — HYDROMORPHONE HCL 2 MG/ML IJ SOLN
2.0000 mg | INTRAMUSCULAR | Status: DC | PRN
  Administered 2014-12-27 – 2014-12-29 (×10): 2 mg via INTRAVENOUS
  Filled 2014-12-27 (×11): qty 1

## 2014-12-27 NOTE — Progress Notes (Signed)
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  Patient still has pain at 8 out of 10 pain in his chest and upper arms.  He is on Dilaudid PCA and has used 30 mg with 61 demands on 61 deliveries.  Also on Toradol.  Patient is highly tolerant to opiates. No SOB, no cough, no fever. Objective: Vital signs in last 24 hours: Temp:  [98.1 F (36.7 C)-99 F (37.2 C)] 98.3 F (36.8 C) (11/26 1700) Pulse Rate:  [73-95] 89 (11/26 1700) Resp:  [17-22] 17 (11/26 1700) BP: (115-126)/(77-86) 123/83 mmHg (11/26 1700) SpO2:  [93 %-98 %] 96 % (11/26 1700) FiO2 (%):  [35 %] 35 % (11/26 0407) Weight change:  Last BM Date: 12/26/14  Intake/Output from previous day: 11/25 0701 - 11/26 0700 In: 2015.4 [I.V.:2015.4] Out: 750 [Urine:750] Intake/Output this shift: Total I/O In: 600 [P.O.:600] Out: 1725 [Urine:1725]  General appearance: alert, cooperative and no distress Head: Normocephalic, without obvious abnormality, atraumatic Neck: no adenopathy, no carotid bruit, no JVD, supple, symmetrical, trachea midline and thyroid not enlarged, symmetric, no tenderness/mass/nodules Back: symmetric, no curvature. ROM normal. No CVA tenderness. Resp: clear to auscultation bilaterally Chest wall: no tenderness Cardio: regular rate and rhythm, S1, S2 normal, no murmur, click, rub or gallop GI: soft, non-tender; bowel sounds normal; no masses,  no organomegaly Extremities: extremities normal, atraumatic, no cyanosis or edema Pulses: 2+ and symmetric Skin: Skin color, texture, turgor normal. No rashes or lesions Neurologic: Grossly normal  Lab Results:  Recent Labs  12/26/14 0700 12/26/14 0712 12/27/14 0448  WBC 19.7*  --  20.3*  HGB 6.9* 7.8* 6.3*  HCT 20.2* 23.0* 19.1*  PLT 513*  --  477*   BMET  Recent Labs  12/26/14 0712 12/27/14 0448  NA 143 135  K 3.6 3.4*  CL 107 107  CO2  --  23  GLUCOSE 108* 110*  BUN 7 7  CREATININE 0.80 0.59*  CALCIUM  --  8.4*     Studies/Results: No results found.  Medications: I have reviewed the patient's current medications.  Assessment/Plan: A 34 yo Gentleman admitted with sickle cell painful crisis.  1. Sickle cell painful crisis: patient will be maintained on Dilaudid PCA with Toradol and IVF. Will add Physician assissted dosing at 2 mg Q 3 hours for next 24 hours. Reassess in am. 2. HTN: Controlled. Continue home medications 3. Anemia: due to sickle cell disease. H/H at baseline. Continue to monitor. 4. Hypokalemia: Will replete. 5. Chronic anticoagulation: Continue Lovenox 6. Hypoxemia: due to pulmonary HTN: On Oxygen at home. Continue. 7. Disposition: will DC when patient is more stable and pain at goal of 3-4/10.   LOS: 1 day   [name],[name] 12/27/2014, 5:56 PM

## 2014-12-27 NOTE — Progress Notes (Signed)
NP just wants rounding MD be made aware of hemoglobin this am.

## 2014-12-27 NOTE — Progress Notes (Signed)
Paged Dr Jonelle Sidle about hemoglobin via [email or web address].

## 2014-12-27 NOTE — Progress Notes (Signed)
Paged NP Kathline Magic about hgb 6.3 this am and K+ 3.4.

## 2014-12-27 NOTE — Progress Notes (Signed)
Paged NP on call about adding toradol to regimen. Orders received for one dose. Patient requesting additional pain meds but minimal use on PCA.

## 2014-12-28 LAB — CBC
HEMATOCRIT: 18.7 % — AB (ref 39.0–52.0)
Hemoglobin: 6.5 g/dL — CL (ref 13.0–17.0)
MCH: 32.3 pg (ref 26.0–34.0)
MCHC: 34.8 g/dL (ref 30.0–36.0)
MCV: 93 fL (ref 78.0–100.0)
Platelets: 458 10*3/uL — ABNORMAL HIGH (ref 150–400)
RBC: 2.01 MIL/uL — ABNORMAL LOW (ref 4.22–5.81)
RDW: 19.3 % — AB (ref 11.5–15.5)
WBC: 17.9 10*3/uL — AB (ref 4.0–10.5)

## 2014-12-28 MED ORDER — KETOROLAC TROMETHAMINE 30 MG/ML IJ SOLN
30.0000 mg | Freq: Four times a day (QID) | INTRAMUSCULAR | Status: DC
  Administered 2014-12-28 – 2015-01-01 (×17): 30 mg via INTRAVENOUS
  Filled 2014-12-28 (×17): qty 1

## 2014-12-28 NOTE — Progress Notes (Signed)
Utilization review completed.  

## 2014-12-28 NOTE — Progress Notes (Signed)
Subjective: Patient still having 8 out of 10 pain in his legs and lower back. He has taken his medications without much relief. He is on Dilaudid PCA and used 26 mg with 57 demands and 52 deliveries since admission. He is also on Toradol and IV fluids. Patient has received physician assisted dosing since yesterday which seemed to help some.  No SOB, no cough, no fever. Objective: Vital signs in last 24 hours: Temp:  [98.2 F (36.8 C)-99.4 F (37.4 C)] 98.2 F (36.8 C) (11/27 1250) Pulse Rate:  [83-89] 88 (11/27 1250) Resp:  [17-24] 20 (11/27 1250) BP: (120-130)/(71-89) 130/71 mmHg (11/27 1250) SpO2:  [90 %-97 %] 93 % (11/27 1250) Weight:  [78.9 kg (173 lb 15.1 oz)] 78.9 kg (173 lb 15.1 oz) (11/27 0600) Weight change: 4.057 kg (8 lb 15.1 oz) Last BM Date: 12/26/14  Intake/Output from previous day: 11/26 0701 - 11/27 0700 In: 3991.6 [P.O.:1330; I.V.:2596.6; IV Piggyback:65] Out: 2725 [Urine:2725] Intake/Output this shift: Total I/O In: 1550.8 [P.O.:1200; I.V.:350.8] Out: 1600 [Urine:1600]  General appearance: alert, cooperative and no distress Head: Normocephalic, without obvious abnormality, atraumatic Neck: no adenopathy, no carotid bruit, no JVD, supple, symmetrical, trachea midline and thyroid not enlarged, symmetric, no tenderness/mass/nodules Back: symmetric, no curvature. ROM normal. No CVA tenderness. Resp: clear to auscultation bilaterally Chest wall: no tenderness Cardio: regular rate and rhythm, S1, S2 normal, no murmur, click, rub or gallop GI: soft, non-tender; bowel sounds normal; no masses,  no organomegaly Extremities: extremities normal, atraumatic, no cyanosis or edema Pulses: 2+ and symmetric Skin: Skin color, texture, turgor normal. No rashes or lesions Neurologic: Grossly normal  Lab Results:  Recent Labs  12/27/14 0448 12/28/14 0950  WBC 20.3* 17.9*  HGB 6.3* 6.5*  HCT 19.1* 18.7*  PLT 477* 458*   BMET  Recent Labs  12/26/14 0712 12/27/14 0448   NA 143 135  K 3.6 3.4*  CL 107 107  CO2  --  23  GLUCOSE 108* 110*  BUN 7 7  CREATININE 0.80 0.59*  CALCIUM  --  8.4*    Studies/Results: No results found.  Medications: I have reviewed the patient's current medications.  Assessment/Plan: A 34 yo Gentleman admitted with sickle cell painful crisis.  1. Sickle cell painful crisis: patient is beginning to feel better now. He will be maintained on Dilaudid PCA with Toradol and IVF. Will continue Physician assissted dosing at 2 mg Q 3 hours for next 24 hours. Reassess in am. 2. HTN: Controlled. Continue home medications 3. Anemia: due to sickle cell disease. H/H at baseline. Continue to monitor. 4. Hypokalemia: Will replete. 5. Chronic anticoagulation: Continue Lovenox 6. Hypoxemia: due to pulmonary HTN: On Oxygen at home. Continue. 7. Leukocytosis: White count is getting better. Continue to monitor. 8. Disposition: will DC when patient is more stable and pain at goal of 3-4/10.   LOS: 2 days   Elbert Spickler,[name] 12/28/2014, 3:26 PM

## 2014-12-29 ENCOUNTER — Inpatient Hospital Stay (HOSPITAL_COMMUNITY): Payer: Medicare Other

## 2014-12-29 DIAGNOSIS — R0609 Other forms of dyspnea: Secondary | ICD-10-CM

## 2014-12-29 DIAGNOSIS — D638 Anemia in other chronic diseases classified elsewhere: Secondary | ICD-10-CM

## 2014-12-29 DIAGNOSIS — I2781 Cor pulmonale (chronic): Secondary | ICD-10-CM

## 2014-12-29 LAB — COMPREHENSIVE METABOLIC PANEL
ALBUMIN: 4.2 g/dL (ref 3.5–5.0)
ALT: 32 U/L (ref 17–63)
ANION GAP: 8 (ref 5–15)
AST: 44 U/L — ABNORMAL HIGH (ref 15–41)
Alkaline Phosphatase: 82 U/L (ref 38–126)
BUN: 11 mg/dL (ref 6–20)
CO2: 19 mmol/L — AB (ref 22–32)
Calcium: 8.5 mg/dL — ABNORMAL LOW (ref 8.9–10.3)
Chloride: 109 mmol/L (ref 101–111)
Creatinine, Ser: 0.74 mg/dL (ref 0.61–1.24)
GFR calc Af Amer: >60 mL/min (ref >60)
GFR calc non Af Amer: >60 mL/min (ref >60)
GLUCOSE: 107 mg/dL — AB (ref 65–99)
POTASSIUM: 3.9 mmol/L (ref 3.5–5.1)
SODIUM: 136 mmol/L (ref 135–145)
TOTAL PROTEIN: 7.3 g/dL (ref 6.5–8.1)
Total Bilirubin: 5.2 mg/dL — ABNORMAL HIGH (ref 0.3–1.2)

## 2014-12-29 LAB — CBC WITH DIFFERENTIAL/PLATELET
BASOS PCT: 1 %
Basophils Absolute: 0.1 10*3/uL (ref 0.0–0.1)
EOS ABS: 1 10*3/uL — AB (ref 0.0–0.7)
Eosinophils Relative: 6 %
HCT: 17.2 % — ABNORMAL LOW (ref 39.0–52.0)
Hemoglobin: 6 g/dL — CL (ref 13.0–17.0)
Lymphocytes Relative: 18 %
Lymphs Abs: 3.2 10*3/uL (ref 0.7–4.0)
MCH: 31.7 pg (ref 26.0–34.0)
MCHC: 34.9 g/dL (ref 30.0–36.0)
MCV: 91 fL (ref 78.0–100.0)
MONO ABS: 2.5 10*3/uL — AB (ref 0.1–1.0)
MONOS PCT: 14 %
Neutro Abs: 10.7 10*3/uL — ABNORMAL HIGH (ref 1.7–7.7)
Neutrophils Relative %: 61 %
PLATELETS: 442 10*3/uL — AB (ref 150–400)
RBC: 1.89 MIL/uL — ABNORMAL LOW (ref 4.22–5.81)
RDW: 18.2 % — AB (ref 11.5–15.5)
WBC: 17.4 10*3/uL — ABNORMAL HIGH (ref 4.0–10.5)

## 2014-12-29 LAB — MRSA PCR SCREENING: MRSA by PCR: NEGATIVE

## 2014-12-29 LAB — RETICULOCYTES
RBC.: 1.92 MIL/uL — AB (ref 4.22–5.81)
RETIC COUNT ABSOLUTE: 341.8 10*3/uL — AB (ref 19.0–186.0)
Retic Ct Pct: 17.8 % — ABNORMAL HIGH (ref 0.4–3.1)

## 2014-12-29 LAB — PREPARE RBC (CROSSMATCH)

## 2014-12-29 MED ORDER — HYDROMORPHONE 1 MG/ML IV SOLN
INTRAVENOUS | Status: DC
  Administered 2014-12-29: 9.5 mg via INTRAVENOUS
  Administered 2014-12-29: 4.5 mg via INTRAVENOUS
  Administered 2014-12-30: 2.5 mg via INTRAVENOUS
  Administered 2014-12-30 (×2): 4 mg via INTRAVENOUS
  Administered 2014-12-30: 1 mg via INTRAVENOUS
  Administered 2014-12-30: 16:00:00 via INTRAVENOUS
  Administered 2014-12-31: 3.5 mg via INTRAVENOUS
  Administered 2014-12-31: 14:00:00 via INTRAVENOUS
  Administered 2014-12-31: 2.5 mg via INTRAVENOUS
  Administered 2014-12-31: 4 mg via INTRAVENOUS
  Administered 2014-12-31: 5.5 mg via INTRAVENOUS
  Administered 2015-01-01: 0 mg via INTRAVENOUS
  Administered 2015-01-01: 3.5 mg via INTRAVENOUS
  Administered 2015-01-01: 1.5 mg via INTRAVENOUS
  Filled 2014-12-29 (×2): qty 25

## 2014-12-29 MED ORDER — HYDROMORPHONE HCL 2 MG/ML IJ SOLN
2.0000 mg | INTRAMUSCULAR | Status: DC | PRN
  Administered 2014-12-29 – 2014-12-31 (×12): 2 mg via INTRAVENOUS
  Filled 2014-12-29 (×14): qty 1

## 2014-12-29 MED ORDER — SODIUM CHLORIDE 0.9 % IV SOLN
Freq: Once | INTRAVENOUS | Status: AC
  Administered 2014-12-29: 250 mL via INTRAVENOUS

## 2014-12-29 NOTE — Care Management Important Message (Signed)
Important Message  Patient Details  Name: [name] MRN: [id number] Date of Birth: 1980/02/26   Medicare Important Message Given:  Yes    [name] 12/29/2014, 11:38 AMImportant Message  Patient Details  Name: [name] MRN: [id number] Date of Birth: [birth year]   Medicare Important Message Given:  Yes    [name] 12/29/2014, 11:38 AM

## 2014-12-29 NOTE — Progress Notes (Signed)
PHARMACIST - PHYSICIAN COMMUNICATION CONCERNING:  Hydrea  Hydroxyurea (Hydrea) hold criteria:  ANC < 2  Pltc < 80K in sickle-cell patients; < 100K in other patients  Hgb <= 6 in sickle-cell patients; < 8 in other patients  Reticulocytes < 80K when Hgb < 9  9 yoM with SCC on hydrea.  Hemoglobin today = 6 g/dL therefore hydrea placed on hold per P&T policy.  Please address and re-order when clinically appropriate.  Thank you.   Ralene Bathe, PharmD, BCPS 12/29/2014, 9:58 AM  Pager: [phone]

## 2014-12-29 NOTE — Progress Notes (Signed)
Blood is ready when order is placed for transfusion. O2 decreased to 4L per Dr. Zigmund Daniel, tolerating. O2 sat 92%.

## 2014-12-29 NOTE — Progress Notes (Signed)
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 Hb SS with crisis: Pt still having pain in ribs at level of 7/10. Will continue PCA at current dosing and increase frequency of clinician assisted doses to every 3 hours. Continue Toradol and decrease IVF as patient eating and drinking well. 2. Increased DOE: Pt c/o increased DOE which he reports has been there since starting this crisis. It is unclear of he has a primary pneumonic process or wether the anemia is impacting his activity tolerance. Will obtain a CXR and T&C for 1 unit. If no new process on CXR, will treat foro symptomatic anemia by transfusing 1 unit RBC's.  3. Leukocytosis: No evidence of infection. Likely related to crisis. 4. Anemia of Chronic Disease: Pt has a tolerated anemia with baseline Hb of 6.5. However he is having DOE and this may be related to decreased Hb levels in setting of Pulmonary HTN. If no acute findings on CXR will transfuse 1 units RBC's.  5. Chronic pain: Continue MS Contin   Code Status: Full Code Family Communication: N/A Disposition Plan: Not yet ready for discharge  [place].  Pager [phone]. If 7PM-7AM, please contact night-coverage.  12/29/2014, 2:09 PM  LOS: 3 days   Interim History: Pt reports pain as 7/10 in the ribs. He also reports increased DOE with activity which is new since this admission. He denies pleuritic pain and has no SOB at rest.   Consultants:  None  Procedures:  None  Antibiotics:  None   Objective: Filed Vitals:   12/29/14 1100 12/29/14 1128 12/29/14 1130 12/29/14 1226  BP: 115/75 118/82 118/82   Pulse: 72     Temp:      TempSrc:      Resp: 16   21  Height:      Weight:       SpO2: 95%   95%   Weight change: 3 lb 12 oz (1.7 kg)  Intake/Output Summary (Last 24 hours) at 12/29/14 1409 Last data filed at 12/29/14 1200  Gross per 24 hour  Intake 3164.88 ml  Output   1620 ml  Net 1544.88 ml    General: Alert, awake, oriented x3, non-toxic appearing.  HEENT: [place]/AT PEERL, EOMI, mild icterus Neck: Trachea midline,  no masses, no thyromegal,y no JVD, no carotid bruit OROPHARYNX:  Moist, No exudate/ erythema/lesions. Geographic tongue. Heart: Regular rate and rhythm, without murmurs, rubs, gallops, PMI non-displaced, no heaves or thrills on palpation.  Lungs: Clear to auscultation, no wheezing or rhonchi noted. No increased vocal fremitus resonant to percussion  Abdomen: Soft, nontender, nondistended, positive bowel sounds, no masses no hepatosplenomegaly noted.  Neuro: No focal neurological deficits noted cranial nerves II through XII grossly intact.  Strength at functional baselinen bilateral upper and lower extremities. Musculoskeletal: No warm swelling or erythema around joints, no spinal tenderness noted. Psychiatric: Patient alert and oriented x3, good insight and cognition, good recent to remote recall.    Data Reviewed: Basic Metabolic Panel:  Recent Labs Lab 12/24/14 0446 12/26/14 0712 12/26/14 1500 12/27/14 0448 12/29/14 0630  NA 141 143  --  135 136  K 3.6 3.6  --  3.4* 3.9  CL 104 107  --  107 109  CO2  --   --   --  23 19*  GLUCOSE 105* 108*  --  110* 107*  BUN 16 7  --  7 11  CREATININE 0.90 0.80  --  0.59* 0.74  CALCIUM  --   --   --  8.4* 8.5*  MG  --   --  1.7  --   --    Liver Function Tests:  Recent Labs Lab 12/27/14 0448 12/29/14 0630  AST 38 44*  ALT 25 32  ALKPHOS 66 82  BILITOT 4.3* 5.2*  PROT 7.1 7.3  ALBUMIN 3.9 4.2   No results for input(s): LIPASE, AMYLASE in the last 168 hours. No results for input(s): AMMONIA in the last 168 hours. CBC:  Recent Labs Lab 12/24/14 0410  12/26/14 0700 12/26/14 0712  12/27/14 0448 12/28/14 0950 12/29/14 0630  WBC 21.8*  --  19.7*  --  20.3* 17.9* 17.4*  NEUTROABS 16.4*  --  13.1*  --  13.2*  --  10.7*  HGB 7.3*  < > 6.9* 7.8* 6.3* 6.5* 6.0*  HCT 21.4*  < > 20.2* 23.0* 19.1* 18.7* 17.2*  MCV 93.0  --  92.7  --  92.3 93.0 91.0  PLT 559*  --  513*  --  477* 458* 442*  < > = values in this interval not displayed. Cardiac Enzymes: No results for input(s): CKTOTAL, CKMB, CKMBINDEX, TROPONINI in the last 168 hours. BNP (last 3 results)  Recent Labs  08/29/14 0810 09/10/14 0612 11/30/14 0910  BNP 1117.0* 549.5* 639.7*    ProBNP (last 3 results)  Recent Labs  01/01/14 0909  PROBNP 5790.0*    CBG: No results for input(s): GLUCAP in the last 168 hours.  Recent Results (from the past 240 hour(s))  MRSA PCR Screening     Status: None   Collection Time: 12/29/14 11:48 AM  Result Value Ref Range Status   MRSA by PCR NEGATIVE NEGATIVE Final    Comment:        The GeneXpert MRSA Assay (FDA approved for NASAL specimens only), is one component of a comprehensive MRSA colonization surveillance program. It is not intended to diagnose MRSA infection nor to guide or monitor treatment for MRSA infections.      Studies: Dg Chest 2 View  12/06/2014  CLINICAL DATA:  Sickle cell crisis, 3 hours duration. Severe chest pain. EXAM: CHEST  2 VIEW COMPARISON:  11/26/2014 FINDINGS: There is a left subclavian Port-A-Cath with tip in the low SVC. There is unchanged moderate cardiomegaly. Mild central vascular prominence is unchanged. No pleural effusions. No focal airspace consolidation. No pneumothorax. IMPRESSION: Unchanged cardiomegaly. No confluent airspace consolidation or pleural effusion. Electronically Signed   By: Andreas Newport M.D.   On: 12/06/2014 04:43   Ct Angio Chest W/cm &/or Wo Cm  11/30/2014  CLINICAL DATA:  Shortness of breath. EXAM: CT ANGIOGRAPHY CHEST WITH CONTRAST TECHNIQUE: Multidetector CT imaging of the chest was performed using  the standard protocol during bolus administration of intravenous contrast. Multiplanar CT image reconstructions and MIPs were obtained to evaluate the vascular anatomy. CONTRAST:  154mL OMNIPAQUE IOHEXOL 350 MG/ML SOLN COMPARISON:  10/26/2014 FINDINGS: Mediastinum: Overall examination is suboptimal. Poor pulmonary arterial opacification and respiratory motion artifact diminishes exam detail. The heart size is moderately enlarged. No pericardial effusion. Prominent anterior mediastinal lymph nodes are again noted, unchanged from previous exam compatible with adenopathy. The trachea appears patent and is midline. Unremarkable appearance of the esophagus. Left upper lobe segmental pulmonary artery filling defect is identified, image 88 of series 14. Lungs/Pleura:  No pleural effusion identified. There is atelectasis noted in both lung bases. No airspace consolidation. Upper Abdomen: The visualized portions of the liver are diffusely increased and attenuation. Calcified spleen noted. Musculoskeletal: Bony stigmata of sickle cell disease identified. Review of the MIP images confirms the above findings. IMPRESSION: 1. Exam detail diminished secondary to respiratory motion artifact and poor pulmonary arterial opacification. 2. Examination is positive for at least 1 segmental branch pulmonary artery filling defect compatible with acute pulmonary embolus. 3. Chronic changes of sickle cell disease. Electronically Signed   By: Kerby Moors M.D.   On: 11/30/2014 10:28    Scheduled Meds: . sodium chloride   Intravenous Once  . aspirin  81 mg Oral Daily  . cholecalciferol  2,000 Units Oral Daily  . enoxaparin  120 mg Subcutaneous Q24H  . folic acid  1 mg Oral q morning - 10a  . HYDROmorphone   Intravenous 6 times per day  . ketorolac  30 mg Intravenous 4 times per day  . lisinopril  10 mg Oral Daily  . metoprolol succinate  50 mg Oral Daily  . morphine  30 mg Oral Q12H  . potassium chloride SA  20 mEq Oral q  morning - 10a  . senna-docusate  1 tablet Oral BID   Continuous Infusions: . dextrose 5 % and 0.45% NaCl 1,000 mL (12/29/14 1127)    Time spent 40 minutes.

## 2014-12-30 DIAGNOSIS — G894 Chronic pain syndrome: Secondary | ICD-10-CM

## 2014-12-30 LAB — RETICULOCYTES: RBC.: 1.9 MIL/uL — AB (ref 4.22–5.81)

## 2014-12-30 LAB — CBC WITH DIFFERENTIAL/PLATELET
BASOS PCT: 0 %
Basophils Absolute: 0 10*3/uL (ref 0.0–0.1)
EOS ABS: 0.6 10*3/uL (ref 0.0–0.7)
EOS PCT: 4 %
HCT: 17 % — ABNORMAL LOW (ref 39.0–52.0)
HEMOGLOBIN: 6 g/dL — AB (ref 13.0–17.0)
LYMPHS PCT: 16 %
Lymphs Abs: 2.6 10*3/uL (ref 0.7–4.0)
MCH: 31.6 pg (ref 26.0–34.0)
MCHC: 35.3 g/dL (ref 30.0–36.0)
MCV: 89.5 fL (ref 78.0–100.0)
MONO ABS: 2.2 10*3/uL — AB (ref 0.1–1.0)
MONOS PCT: 14 %
NEUTROS PCT: 66 %
Neutro Abs: 10.6 10*3/uL — ABNORMAL HIGH (ref 1.7–7.7)
Platelets: 433 10*3/uL — ABNORMAL HIGH (ref 150–400)
RBC: 1.9 MIL/uL — AB (ref 4.22–5.81)
RDW: 18 % — AB (ref 11.5–15.5)
WBC: 16 10*3/uL — ABNORMAL HIGH (ref 4.0–10.5)
nRBC: 1 /100 WBC — ABNORMAL HIGH

## 2014-12-30 MED ORDER — DEXTROSE 5 % IV SOLN
2000.0000 mg | Freq: Every day | INTRAVENOUS | Status: DC
  Administered 2014-12-30 – 2014-12-31 (×2): 2000 mg via INTRAVENOUS
  Filled 2014-12-30 (×3): qty 2

## 2014-12-30 MED ORDER — SODIUM CHLORIDE 0.9 % IV SOLN
Freq: Once | INTRAVENOUS | Status: AC
  Administered 2014-12-30: 15:00:00 via INTRAVENOUS

## 2014-12-30 NOTE — Progress Notes (Signed)
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 Hb SS with crisis: Pt still having pain in ribs at level of 7/10. Will continue PCA  And clinician assisted doses at current dosing and  Frequency.  Continue Toradol and decrease IVF as patient eating and drinking well. 2. Increased DOE: Pt c/o increased DOE which he reports has been there since starting this crisis.CXR negative and patient has no evidence of acute pneumonic process. Will transfuse 1 unit RBC as I feel that this is a symptom of his anemia in setting of Cor Pulmonale.  3. Leukocytosis: No evidence of infection. Likely related to crisis. 4. Anemia of Chronic Disease: Pt has a tolerated anemia with baseline Hb of 6.5. However he is having DOE and this may be related to decreased Hb levels in setting of Pulmonary HTN. If no acute findings on CXR will transfuse 1 units RBC's.  5. Chronic pain: Continue MS Contin   Code Status: Full Code Family Communication: N/A Disposition Plan: Not yet ready for discharge  [place].  Pager [phone]. If 7PM-7AM, please contact night-coverage.  12/30/2014, 3:55 PM  LOS: 4 days   Interim History: Pt reports pain as 7/10 in the ribs. He also reports increased DOE with activity which is new since this admission. He denies pleuritic pain and has no SOB at rest.   Consultants:  None  Procedures:  None  Antibiotics:  None   Objective: Filed Vitals:   12/30/14 1428 12/30/14 1448 12/30/14 1450 12/30/14 1534  BP: 119/85 133/87  120/79  Pulse: 76 83  80  Temp: 97.9 F (36.6 C) 98.1 F (36.7 C)  98.3 F (36.8 C)  TempSrc: Oral Oral  Oral  Resp: 18 18  18   Height:      Weight:      SpO2: 90%  90%  91%   Weight change: -2 lb 6.8 oz (-1.1 kg)  Intake/Output Summary (Last 24 hours) at 12/30/14 1555 Last data filed at 12/30/14 1459  Gross per 24 hour  Intake     30 ml  Output   1000 ml  Net   -970 ml    General: Alert, awake, oriented x3, non-toxic appearing.  HEENT: [place]/AT [place], EOMI, mild icterus Neck: Trachea midline,  no masses, no thyromegal,y no JVD, no carotid bruit OROPHARYNX:  Moist, No exudate/ erythema/lesions. Geographic tongue. Heart: Regular rate and rhythm, without murmurs, rubs, gallops, PMI non-displaced, no heaves or thrills on palpation.  Lungs: Clear to auscultation, no wheezing or rhonchi noted. No increased vocal fremitus resonant to percussion  Abdomen: Soft, nontender, nondistended, positive bowel sounds, no masses no hepatosplenomegaly noted.  Neuro: No focal neurological deficits noted cranial nerves II through XII grossly intact.  Strength at functional baselinen bilateral upper and lower extremities. Musculoskeletal: No warm swelling or erythema around joints, no spinal tenderness noted. Psychiatric: Patient alert and oriented x3, good insight and cognition, good recent to remote recall.    Data Reviewed: Basic Metabolic Panel:  Recent Labs Lab 12/24/14 0446 12/26/14 0712 12/26/14 1500 12/27/14 0448 12/29/14 0630  NA 141 143  --  135 136  K 3.6 3.6  --  3.4* 3.9  CL 104 107  --  107 109  CO2  --   --   --  23 19*  GLUCOSE 105* 108*  --  110* 107*  BUN 16 7  --  7 11  CREATININE 0.90 0.80  --  0.59* 0.74  CALCIUM  --   --   --  8.4* 8.5*  MG  --   --  1.7  --   --    Liver Function Tests:  Recent Labs Lab 12/27/14 0448 12/29/14 0630  AST 38 44*  ALT 25 32  ALKPHOS 66 82  BILITOT 4.3* 5.2*  PROT 7.1 7.3  ALBUMIN 3.9 4.2   No results for input(s): LIPASE, AMYLASE in the last 168 hours. No results for input(s): AMMONIA in the last 168 hours. CBC:  Recent Labs Lab 12/24/14 0410  12/26/14 0700 12/26/14 0712 12/27/14 0448  12/28/14 0950 12/29/14 0630 12/30/14 0610  WBC 21.8*  --  19.7*  --  20.3* 17.9* 17.4* 16.0*  NEUTROABS 16.4*  --  13.1*  --  13.2*  --  10.7* 10.6*  HGB 7.3*  < > 6.9* 7.8* 6.3* 6.5* 6.0* 6.0*  HCT 21.4*  < > 20.2* 23.0* 19.1* 18.7* 17.2* 17.0*  MCV 93.0  --  92.7  --  92.3 93.0 91.0 89.5  PLT 559*  --  513*  --  477* 458* 442* 433*  < > = values in this interval not displayed. Cardiac Enzymes: No results for input(s): CKTOTAL, CKMB, CKMBINDEX, TROPONINI in the last 168 hours. BNP (last 3 results)  Recent Labs  08/29/14 0810 09/10/14 0612 11/30/14 0910  BNP 1117.0* 549.5* 639.7*    ProBNP (last 3 results)  Recent Labs  01/01/14 0909  PROBNP 5790.0*    CBG: No results for input(s): GLUCAP in the last 168 hours.  Recent Results (from the past 240 hour(s))  MRSA PCR Screening     Status: None   Collection Time: 12/29/14 11:48 AM  Result Value Ref Range Status   MRSA by PCR NEGATIVE NEGATIVE Final    Comment:        The GeneXpert MRSA Assay (FDA approved for NASAL specimens only), is one component of a comprehensive MRSA colonization surveillance program. It is not intended to diagnose MRSA infection nor to guide or monitor treatment for MRSA infections.      Studies: Dg Chest 2 View  12/29/2014  CLINICAL DATA:  Sickle Cell Crisis; SOB;dyspnea with exertion; no chest pain; Former Smoker; Hx HTN; Pulmonary Embolus; Patient has Port A Cath EXAM: CHEST  2 VIEW COMPARISON:  12/16/2014 FINDINGS: Left-sided Port-A-Cath tip to the lower superior vena cava. The heart is mildly enlarged. There has been some improvement in aeration of the lungs bilaterally. Very minimal opacities persist primarily in the right lower lobe. No frank consolidations. No pleural effusions or pulmonary edema. IMPRESSION: 1. Stable cardiomegaly. 2. Improved aeration. Electronically Signed   By: Nolon Nations M.D.   On: 12/29/2014 15:00   Dg Chest 2 View  12/06/2014  CLINICAL DATA:  Sickle  cell crisis, 3 hours duration. Severe chest pain. EXAM: CHEST  2 VIEW COMPARISON:  11/26/2014 FINDINGS: There is a left subclavian Port-A-Cath with tip in the low SVC. There is unchanged moderate cardiomegaly. Mild central vascular prominence is unchanged. No pleural effusions. No focal airspace consolidation. No pneumothorax. IMPRESSION: Unchanged cardiomegaly. No confluent airspace consolidation or pleural effusion. Electronically Signed   By: Andreas Newport M.D.   On: 12/06/2014 04:43    Scheduled Meds: . aspirin  81 mg Oral Daily  . cholecalciferol  2,000 Units Oral Daily  .  deferoxamine (DESFERAL) IV  2,000 mg Intravenous QHS  . enoxaparin  120 mg Subcutaneous Q24H  . folic acid  1 mg Oral q morning - 10a  . HYDROmorphone   Intravenous 6 times per day  . ketorolac  30 mg Intravenous 4 times per day  . lisinopril  10 mg Oral Daily  . metoprolol succinate  50 mg Oral Daily  . morphine  30 mg Oral Q12H  . potassium chloride SA  20 mEq Oral q morning - 10a  . senna-docusate  1 tablet Oral BID   Continuous Infusions: . dextrose 5 % and 0.45% NaCl 500 mL (12/29/14 1540)    Time spent 30 minutes.

## 2014-12-31 DIAGNOSIS — R0902 Hypoxemia: Secondary | ICD-10-CM

## 2014-12-31 LAB — CBC WITH DIFFERENTIAL/PLATELET
BASOS ABS: 0.1 10*3/uL (ref 0.0–0.1)
Basophils Relative: 1 %
EOS ABS: 0.6 10*3/uL (ref 0.0–0.7)
EOS PCT: 4 %
HCT: 20.4 % — ABNORMAL LOW (ref 39.0–52.0)
Hemoglobin: 7.1 g/dL — ABNORMAL LOW (ref 13.0–17.0)
LYMPHS ABS: 2 10*3/uL (ref 0.7–4.0)
Lymphocytes Relative: 13 %
MCH: 31 pg (ref 26.0–34.0)
MCHC: 34.8 g/dL (ref 30.0–36.0)
MCV: 89.1 fL (ref 78.0–100.0)
MONO ABS: 2 10*3/uL — AB (ref 0.1–1.0)
Monocytes Relative: 13 %
Neutro Abs: 10.5 10*3/uL — ABNORMAL HIGH (ref 1.7–7.7)
Neutrophils Relative %: 69 %
PLATELETS: 404 10*3/uL — AB (ref 150–400)
RBC: 2.29 MIL/uL — AB (ref 4.22–5.81)
RDW: 18.3 % — AB (ref 11.5–15.5)
WBC: 15.2 10*3/uL — AB (ref 4.0–10.5)

## 2014-12-31 LAB — BASIC METABOLIC PANEL
Anion gap: 4 — ABNORMAL LOW (ref 5–15)
BUN: 9 mg/dL (ref 6–20)
CHLORIDE: 113 mmol/L — AB (ref 101–111)
CO2: 23 mmol/L (ref 22–32)
CREATININE: 0.75 mg/dL (ref 0.61–1.24)
Calcium: 9 mg/dL (ref 8.9–10.3)
GFR calc Af Amer: >60 mL/min (ref >60)
GLUCOSE: 126 mg/dL — AB (ref 65–99)
POTASSIUM: 4.2 mmol/L (ref 3.5–5.1)
SODIUM: 140 mmol/L (ref 135–145)

## 2014-12-31 LAB — TYPE AND SCREEN
ABO/RH(D): O POS
Antibody Screen: NEGATIVE
UNIT DIVISION: 0

## 2014-12-31 MED ORDER — HYDROMORPHONE HCL 4 MG PO TABS: 4.0000 mg | ORAL_TABLET | ORAL | Status: DC

## 2014-12-31 MED ORDER — HYDROMORPHONE HCL 4 MG PO TABS
6.0000 mg | ORAL_TABLET | ORAL | Status: DC
  Administered 2014-12-31 – 2015-01-01 (×7): 6 mg via ORAL
  Filled 2014-12-31 (×7): qty 1

## 2014-12-31 NOTE — Progress Notes (Signed)
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 Increased DOE: DOE has not improved with transfusion. My assessment is that DOE is secondary to a progression of his Pulmonary HTN and patient will need to discuss options for palliative care with his PMD. 2. Hb SS with crisis: Pt still having pain in ribs at level of 5-6/10. Will schedule ora; dilaudid 6 mg every 4 hours and continue PCA as PRN and continue Toradol. Anticipate discharge home tomorrow.  3. 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 He is on chronic Oxygen therapy and I feel that he is having symptoms of worsening which are presenting as DOE. Continue Oxygen and consider keeping Hb levels above 8 if in keeping with goals of care. 4. Leukocytosis: No evidence of infection. Likely related to crisis. 5. Anemia of Chronic Disease: Pt has a tolerated anemia with baseline Hb of 6.5. However he is having DOE and this may be related to decreased Hb levels in setting of Pulmonary HTN. He was  transfused 1 units RBC's and Hb is currently 7.1 g/dl.  6. Chronic anticoagulation: Pt is currently on Lovenox. Continue. 7. Chronic pain: Pt is currently on  MS Contin 30 mg q 12 hours as well as Oral Dilaudid. He has expressed that he believes that he has developed some tolerance to the drugs thus he is requiring higher doses of Dilaudid to control his pain. I have advised patient to speak with Dr. Doreene Burke regarding establishing goals of care that speak to his worsening Cor Pulmonale and addresses his chronic pain.   Code Status: Full Code Family Communication: N/A Disposition Plan: Not yet ready for  discharge  [place].  Pager [phone]. If 7PM-7AM, please contact night-coverage.  12/31/2014, 2:20 PM  LOS: 5 days   Interim History: Pt reports pain as 7/10 in the ribs. He also reports increased DOE with activity which is new since this admission. He denies pleuritic pain and has no SOB at rest.   Consultants:  None  Procedures:  None  Antibiotics:  None   Objective: Filed Vitals:   12/31/14 1010 12/31/14 1200 12/31/14 1324 12/31/14 1410  BP:   118/86   Pulse:   68   Temp:   98.2 F (36.8 C)   TempSrc:   Oral   Resp:  15 18 21   Height:      Weight:      SpO2: 93% 93% 93% 93%   Weight change:   Intake/Output Summary (Last 24 hours) at 12/31/14 1420 Last data filed at 12/31/14 1324  Gross per 24 hour  Intake   1370 ml  Output   1325 ml  Net     45 ml    General: Alert, awake, oriented x3, non-toxic appearing.  HEENT: [place]/AT [place]ER[place], EOMI, mild icterus Neck: Trachea midline,  no masses, no thyromegal,y no JVD, no carotid bruit OROPHARYNX:  Moist, No exudate/ erythema/lesions. Geographic tongue. Heart: Regular rate and rhythm, without murmurs, rubs, gallops, PMI non-displaced, no heaves or thrills on palpation.  Lungs: Clear to auscultation, no wheezing or rhonchi noted. No increased vocal fremitus resonant to percussion  Abdomen: Soft, nontender, nondistended, positive bowel sounds, no masses no  hepatosplenomegaly noted.  Neuro: No focal neurological deficits noted cranial nerves II through XII grossly intact.  Strength at functional baselinen bilateral upper and lower extremities. Musculoskeletal: No warm swelling or erythema around joints, no spinal tenderness noted. Psychiatric: Patient alert and oriented x3, good insight and cognition, good recent to remote recall.    Data Reviewed: Basic Metabolic Panel:  Recent Labs Lab 12/26/14 0712 12/26/14 1500 12/27/14 0448 12/29/14 0630 12/31/14 1055  NA 143  --  135 136 140  K 3.6  --  3.4* 3.9  4.2  CL 107  --  107 109 113*  CO2  --   --  23 19* 23  GLUCOSE 108*  --  110* 107* 126*  BUN 7  --  7 11 9   CREATININE 0.80  --  0.59* 0.74 0.75  CALCIUM  --   --  8.4* 8.5* 9.0  MG  --  1.7  --   --   --    Liver Function Tests:  Recent Labs Lab 12/27/14 0448 12/29/14 0630  AST 38 44*  ALT 25 32  ALKPHOS 66 82  BILITOT 4.3* 5.2*  PROT 7.1 7.3  ALBUMIN 3.9 4.2   No results for input(s): LIPASE, AMYLASE in the last 168 hours. No results for input(s): AMMONIA in the last 168 hours. CBC:  Recent Labs Lab 12/26/14 0700  12/27/14 0448 12/28/14 0950 12/29/14 0630 12/30/14 0610 12/31/14 1055  WBC 19.7*  --  20.3* 17.9* 17.4* 16.0* 15.2*  NEUTROABS 13.1*  --  13.2*  --  10.7* 10.6* 10.5*  HGB 6.9*  < > 6.3* 6.5* 6.0* 6.0* 7.1*  HCT 20.2*  < > 19.1* 18.7* 17.2* 17.0* 20.4*  MCV 92.7  --  92.3 93.0 91.0 89.5 89.1  PLT 513*  --  477* 458* 442* 433* 404*  < > = values in this interval not displayed. Cardiac Enzymes: No results for input(s): CKTOTAL, CKMB, CKMBINDEX, TROPONINI in the last 168 hours. BNP (last 3 results)  Recent Labs  08/29/14 0810 09/10/14 0612 11/30/14 0910  BNP 1117.0* 549.5* 639.7*    ProBNP (last 3 results)  Recent Labs  01/01/14 0909  PROBNP 5790.0*    CBG: No results for input(s): GLUCAP in the last 168 hours.  Recent Results (from the past 240 hour(s))  MRSA PCR Screening     Status: None   Collection Time: 12/29/14 11:48 AM  Result Value Ref Range Status   MRSA by PCR NEGATIVE NEGATIVE Final    Comment:        The GeneXpert MRSA Assay (FDA approved for NASAL specimens only), is one component of a comprehensive MRSA colonization surveillance program. It is not intended to diagnose MRSA infection nor to guide or monitor treatment for MRSA infections.      Studies: Dg Chest 2 View  12/29/2014  CLINICAL DATA:  Sickle Cell Crisis; SOB;dyspnea with exertion; no chest pain; Former Smoker; Hx HTN; Pulmonary Embolus; Patient has  Port A Cath EXAM: CHEST  2 VIEW COMPARISON:  12/16/2014 FINDINGS: Left-sided Port-A-Cath tip to the lower superior vena cava. The heart is mildly enlarged. There has been some improvement in aeration of the lungs bilaterally. Very minimal opacities persist primarily in the right lower lobe. No frank consolidations. No pleural effusions or pulmonary edema. IMPRESSION: 1. Stable cardiomegaly. 2. Improved aeration. Electronically Signed   By: Nolon Nations M.D.   On: 12/29/2014 15:00   Dg Chest 2 View  12/06/2014  CLINICAL DATA:  Sickle cell crisis,  3 hours duration. Severe chest pain. EXAM: CHEST  2 VIEW COMPARISON:  11/26/2014 FINDINGS: There is a left subclavian Port-A-Cath with tip in the low SVC. There is unchanged moderate cardiomegaly. Mild central vascular prominence is unchanged. No pleural effusions. No focal airspace consolidation. No pneumothorax. IMPRESSION: Unchanged cardiomegaly. No confluent airspace consolidation or pleural effusion. Electronically Signed   By: Andreas Newport M.D.   On: 12/06/2014 04:43    Scheduled Meds: . aspirin  81 mg Oral Daily  . cholecalciferol  2,000 Units Oral Daily  . deferoxamine (DESFERAL) IV  2,000 mg Intravenous QHS  . enoxaparin  120 mg Subcutaneous Q24H  . folic acid  1 mg Oral q morning - 10a  . HYDROmorphone   Intravenous 6 times per day  . HYDROmorphone  6 mg Oral Q4H  . ketorolac  30 mg Intravenous 4 times per day  . lisinopril  10 mg Oral Daily  . metoprolol succinate  50 mg Oral Daily  . morphine  30 mg Oral Q12H  . potassium chloride SA  20 mEq Oral q morning - 10a  . senna-docusate  1 tablet Oral BID   Continuous Infusions: . dextrose 5 % and 0.45% NaCl 10 mL/hr at 12/31/14 0332    Time spent 32minutes.

## 2015-01-01 DIAGNOSIS — J9611 Chronic respiratory failure with hypoxia: Secondary | ICD-10-CM

## 2015-01-01 MED ORDER — HEPARIN SOD (PORK) LOCK FLUSH 100 UNIT/ML IV SOLN
500.0000 [IU] | Freq: Once | INTRAVENOUS | Status: DC
  Filled 2015-01-01: qty 5

## 2015-01-01 NOTE — Discharge Summary (Signed)
[name] MRN: [id number] DOB/AGE: 35/35/1982 35 y.o.  Admit date: 12/26/2014 Discharge date: 01/01/2015  Primary Care Physician:  [name], MD   Discharge Diagnoses:   Patient Active Problem List   Diagnosis Date Noted  . Atrial fibrillation with RVR ([place]) 08/29/2014    Priority: High  . Cor pulmonale, chronic ([place]) 08/25/2014    Priority: High  . Pulmonary HTN ([place]) 06/18/2013    Priority: High  . Functional asplenia     Priority: High  . Anemia of chronic disease 06/25/2014    Priority: Medium  . Hypoxia 03/14/2014    Priority: Medium  . Hb-SS disease with crisis ([place]) 01/22/2014    Priority: Medium  . Chronic anticoagulation 08/22/2013    Priority: Medium  . Avascular necrosis ([place])     Priority: Low  . Sickle cell anemia with pain ([place]) 12/26/2014  . Sickle-cell disease with pain ([place]) 12/15/2014  . Pulmonary emboli ([place]) 11/30/2014  . Pulmonary embolus ([place]) 11/30/2014  . Syncope 11/30/2014  . Sickle cell pain crisis ([place]) 11/23/2014  . Chest pain   . Hb-SS disease without crisis ([place]) 10/07/2014  . Acute respiratory failure with hypoxia ([place])   . Acute chest syndrome in sickle crisis ([place])   . PVC's (premature ventricular contractions) 09/10/2014  . Abnormal EKG 09/10/2014  . Hypoxemia   . Chronic atrial fibrillation ([place]) 09/09/2014  . Sickle cell crisis ([place]) 09/08/2014  . Bacteremia   . Elevated troponin I level 08/29/2014  . Ankle edema 07/07/2014  . Sickle cell anemia ([place]) 06/25/2014  . PAH (pulmonary artery hypertension) ([place]) 03/18/2014  . Paralytic strabismus, external ophthalmoplegia   . Chronic pain syndrome 12/12/2013  . Essential hypertension 08/22/2013  . Vitamin D deficiency 02/13/2013  . Uses marijuana 01/13/2013  . Embolism, pulmonary with infarction ([place]) 07/09/2012  . Hx of pulmonary embolus 06/29/2012  . Hemochromatosis 12/14/2011    DISCHARGE MEDICATION:   Medication List    TAKE these medications        aspirin 81 MG chewable tablet  Chew 1 tablet (81 mg total) by mouth daily.     enoxaparin 120 MG/0.8ML injection  Commonly known as:  LOVENOX  Inject 0.8 mLs (120 mg total) into the skin daily.     folic acid 1 MG tablet  Commonly known as:  FOLVITE  Take 1 tablet (1 mg total) by mouth every morning.     HYDROmorphone 4 MG tablet  Commonly known as:  DILAUDID  Take 1 tablet (4 mg total) by mouth every 4 (four) hours as needed for severe pain.     hydroxyurea 500 MG capsule  Commonly known as:  HYDREA  Take 1 capsule (500 mg total) by mouth daily. May take with food to minimize GI side effects.     lisinopril 10 MG tablet  Commonly known as:  PRINIVIL,ZESTRIL  Take 1 tablet (10 mg total) by mouth daily.     metoprolol succinate 50 MG 24 hr tablet  Commonly known as:  TOPROL-XL  Take 1 tablet (50 mg total) by mouth daily.     morphine 30 MG 12 hr tablet  Commonly known as:  MS CONTIN  Take 1 tablet (30 mg total) by mouth every 12 (twelve) hours.     potassium chloride SA 20 MEQ tablet  Commonly known as:  K-DUR,KLOR-CON  Take 1 tablet (20 mEq total) by mouth every morning.     Vitamin D 2000 UNITS tablet  Take 1 tablet (2,000 Units total) by  mouth daily.     zolpidem 10 MG tablet  Commonly known as:  AMBIEN  Take 1 tablet (10 mg total) by mouth at bedtime as needed for sleep.          Consults:     SIGNIFICANT DIAGNOSTIC STUDIES:  Dg Chest 2 View  12/29/2014  CLINICAL DATA:  Sickle Cell Crisis; SOB;dyspnea with exertion; no chest pain; Former Smoker; Hx HTN; Pulmonary Embolus; Patient has Port A Cath EXAM: CHEST  2 VIEW COMPARISON:  12/16/2014 FINDINGS: Left-sided Port-A-Cath tip to the lower superior vena cava. The heart is mildly enlarged. There has been some improvement in aeration of the lungs bilaterally. Very minimal opacities persist primarily in the right lower lobe. No frank consolidations. No pleural effusions or pulmonary edema. IMPRESSION: 1. Stable  cardiomegaly. 2. Improved aeration. Electronically Signed   By: Nolon Nations M.D.   On: 12/29/2014 15:00   Dg Chest 2 View  12/06/2014  CLINICAL DATA:  Sickle cell crisis, 3 hours duration. Severe chest pain. EXAM: CHEST  2 VIEW COMPARISON:  11/26/2014 FINDINGS: There is a left subclavian Port-A-Cath with tip in the low SVC. There is unchanged moderate cardiomegaly. Mild central vascular prominence is unchanged. No pleural effusions. No focal airspace consolidation. No pneumothorax. IMPRESSION: Unchanged cardiomegaly. No confluent airspace consolidation or pleural effusion. Electronically Signed   By: Andreas Newport M.D.   On: 12/06/2014 04:43       Recent Results (from the past 240 hour(s))  MRSA PCR Screening     Status: None   Collection Time: 12/29/14 11:48 AM  Result Value Ref Range Status   MRSA by PCR NEGATIVE NEGATIVE Final    Comment:        The GeneXpert MRSA Assay (FDA approved for NASAL specimens only), is one component of a comprehensive MRSA colonization surveillance program. It is not intended to diagnose MRSA infection nor to guide or monitor treatment for MRSA infections.     BRIEF ADMITTING H & P: A 34 year old gentleman with known history of sickle cell disease who has been in and out of the hospital several times this year presented to the ER with pain in his back and legs rated as 10 out of 10. He was in the sickle cell day hospital 2 days ago where he was treated and sent home. He apparently felt better but yesterday the pain came back. He came back to the ER today where the pain is escalating. Worsened by all movement and not relieved by anything. The sickle cell day hospital is close today so patient is not able to go therefore treatment. He also has shortness of breath but nothing beyond baseline. He is hypoxemic on room air but is known to be on home oxygen. No cough no fever   Hospital Course:  Present on Admission:  . Hb-SS disease with crisis  [facility]): Pt was managed with IV Dilaudid via PCA. He also received clinician assisited doses initially and IV Toradol. He was weaned off IV Dilaudid and transitioned to oral dilaudid. MS Contin was continued throughout his hospital course for management of chronic ongoing pain. Pt is discharged home with pain relatively well controlled. However he has frequent episodes of crisis as his chronic hypoxia is impacting the oxygenation of cells and thus the degree of sickling occurring.   . Increased DOE: Pt reported increased DOE. Evaluation did not reveal an acute pneumonic process and he was transfused 1 unit of RBC's as it was felt to be secondary to  anemia. At the time of discharge the DOE had improved. However DOE is an expectation in the natural progression of pulmonary HTN.    [place] Kitchen Chronic pain syndrome: Pt was continued on MS Contin for the duration of his hospitalization. However he has expressed that his pain is again poorly controlled on a chronic basis a concern that per the patient has been expressed to his PMD. He is unsure of the plans for managing the increase in pain. However I suspect that the worsening Cor-pulmonale and intermittent hypoxia is contributing to his worsening pain on a chronic basis. It may be appropriate to discuss Hospice/Palliative Care on a out patient basis for relief of pain.   . Cor pulmonale, chronic ([place]): Pt has resultant chronic right sided heart failure. However he is currently compensated.  . Chronic Respiratory Failure with Hypoxemia: Pt currently requiring his baseline Oxygen of 4 l/min.   Disposition and Follow-up:  Pt is discharged in god condition and is to follow up at his appointment already scheduled on 01/06/2015.     Discharge Instructions    Activity as tolerated - No restrictions    Complete by:  As directed      Diet - low sodium heart healthy    Complete by:  As directed            DISCHARGE EXAM:  General: Alert, awake, oriented x3, in no  acute distress.  Vital Signs: BP 119/85, HR 69, T 98.3 F (36.8 C), temperature source Oral, RR 15, height 6' (1.829 m), weight 175 lb 4.3 oz (79.5 kg), SpO2 94 %. HEENT: [place]/AT PEERL, EOMI, anicteric Neck: Trachea midline, no masses, no thyromegal,y no JVD, no carotid bruit OROPHARYNX: Moist, No exudate/ erythema/lesions.  Heart: Regular rate and rhythm, without murmurs, rubs, gallops or S3. Pt has a loud P2. Pulmonary/Chest: Normal effort. Breath sounds normal. No. Apnea. Clear to auscultation,no stridor,  no wheezing and no rhonchi noted. No respiratory distress and no tenderness noted. Abdomen: Soft, nontender, nondistended, normal bowel sounds, no masses no hepatosplenomegaly noted. No fluid wave and no ascites. There is no guarding or rebound. Neuro: Alert and oriented to person, place and time. Normal motor skills, Displays no atrophy or tremors and exhibits normal muscle tone.  No focal neurological deficits noted cranial nerves II through XII grossly intact. No sensory deficit noted. Strength at baseline in bilateral upper and lower extremities. Gait normal. Musculoskeletal: No warm swelling or erythema around joints, no spinal tenderness noted. Psychiatric: Patient alert and oriented x3, good insight and cognition, good recent to remote recall. Mood, memory, affect and judgement normal Skin: Skin is warm and dry. No bruising, no ecchymosis and no rash noted. Pt is not diaphoretic. No erythema. No pallor       Recent Labs  12/31/14 1055  NA 140  K 4.2  CL 113*  CO2 23  GLUCOSE 126*  BUN 9  CREATININE 0.75  CALCIUM 9.0   No results for input(s): AST, ALT, ALKPHOS, BILITOT, PROT, ALBUMIN in the last 72 hours. No results for input(s): LIPASE, AMYLASE in the last 72 hours.  Recent Labs  12/30/14 0610 12/31/14 1055  WBC 16.0* 15.2*  NEUTROABS 10.6* 10.5*  HGB 6.0* 7.1*  HCT 17.0* 20.4*  MCV 89.5 89.1  PLT 433* 404*     Total time spent including face to face and  decision making was greater than 30 minutes  Signed: [name],[name] 01/01/2015, 10:52 AM

## 2015-01-01 NOTE — Progress Notes (Signed)
Patient ambulated 100 ft in hallway on O2 4 L, O2 sat 98%, at 3L, sats maintained at 92%. Also checkes on room air, O2 sats to 90%.

## 2015-01-05 ENCOUNTER — Non-Acute Institutional Stay (HOSPITAL_BASED_OUTPATIENT_CLINIC_OR_DEPARTMENT_OTHER)
Admission: AD | Admit: 2015-01-05 | Discharge: 2015-01-05 | Disposition: A | Discharge status: Home / Self Care | Payer: Medicare Other | Source: Home / Self Care | Attending: Internal Medicine | Admitting: Internal Medicine

## 2015-01-05 ENCOUNTER — Encounter (HOSPITAL_COMMUNITY): Payer: Self-pay | Admitting: *Deleted

## 2015-01-05 ENCOUNTER — Telehealth (HOSPITAL_COMMUNITY): Payer: Self-pay | Admitting: *Deleted

## 2015-01-05 DIAGNOSIS — J9611 Chronic respiratory failure with hypoxia: Secondary | ICD-10-CM | POA: Diagnosis present

## 2015-01-05 DIAGNOSIS — Z86711 Personal history of pulmonary embolism: Secondary | ICD-10-CM | POA: Insufficient documentation

## 2015-01-05 DIAGNOSIS — D57819 Other sickle-cell disorders with crisis, unspecified: Secondary | ICD-10-CM | POA: Diagnosis not present

## 2015-01-05 DIAGNOSIS — I2781 Cor pulmonale (chronic): Secondary | ICD-10-CM | POA: Diagnosis not present

## 2015-01-05 DIAGNOSIS — Z87891 Personal history of nicotine dependence: Secondary | ICD-10-CM

## 2015-01-05 DIAGNOSIS — D5700 Hb-SS disease with crisis, unspecified: Secondary | ICD-10-CM | POA: Diagnosis present

## 2015-01-05 DIAGNOSIS — F39 Unspecified mood [affective] disorder: Secondary | ICD-10-CM | POA: Diagnosis present

## 2015-01-05 DIAGNOSIS — I272 Other secondary pulmonary hypertension: Secondary | ICD-10-CM | POA: Diagnosis present

## 2015-01-05 DIAGNOSIS — Z9981 Dependence on supplemental oxygen: Secondary | ICD-10-CM

## 2015-01-05 DIAGNOSIS — Z86718 Personal history of other venous thrombosis and embolism: Secondary | ICD-10-CM

## 2015-01-05 DIAGNOSIS — Z7901 Long term (current) use of anticoagulants: Secondary | ICD-10-CM

## 2015-01-05 DIAGNOSIS — Z7722 Contact with and (suspected) exposure to environmental tobacco smoke (acute) (chronic): Secondary | ICD-10-CM | POA: Diagnosis present

## 2015-01-05 DIAGNOSIS — D638 Anemia in other chronic diseases classified elsewhere: Secondary | ICD-10-CM | POA: Diagnosis present

## 2015-01-05 DIAGNOSIS — D72829 Elevated white blood cell count, unspecified: Secondary | ICD-10-CM | POA: Diagnosis present

## 2015-01-05 DIAGNOSIS — R0781 Pleurodynia: Secondary | ICD-10-CM | POA: Diagnosis not present

## 2015-01-05 DIAGNOSIS — Z818 Family history of other mental and behavioral disorders: Secondary | ICD-10-CM

## 2015-01-05 DIAGNOSIS — I10 Essential (primary) hypertension: Secondary | ICD-10-CM

## 2015-01-05 DIAGNOSIS — Z9114 Patient's other noncompliance with medication regimen: Secondary | ICD-10-CM | POA: Diagnosis not present

## 2015-01-05 DIAGNOSIS — Z832 Family history of diseases of the blood and blood-forming organs and certain disorders involving the immune mechanism: Secondary | ICD-10-CM | POA: Diagnosis not present

## 2015-01-05 DIAGNOSIS — Z7982 Long term (current) use of aspirin: Secondary | ICD-10-CM

## 2015-01-05 DIAGNOSIS — Z79899 Other long term (current) drug therapy: Secondary | ICD-10-CM

## 2015-01-05 DIAGNOSIS — I2699 Other pulmonary embolism without acute cor pulmonale: Secondary | ICD-10-CM | POA: Diagnosis present

## 2015-01-05 DIAGNOSIS — Z79891 Long term (current) use of opiate analgesic: Secondary | ICD-10-CM

## 2015-01-05 DIAGNOSIS — Z96649 Presence of unspecified artificial hip joint: Secondary | ICD-10-CM

## 2015-01-05 DIAGNOSIS — Z96641 Presence of right artificial hip joint: Secondary | ICD-10-CM | POA: Diagnosis present

## 2015-01-05 DIAGNOSIS — Z833 Family history of diabetes mellitus: Secondary | ICD-10-CM | POA: Diagnosis not present

## 2015-01-05 DIAGNOSIS — J811 Chronic pulmonary edema: Secondary | ICD-10-CM | POA: Diagnosis not present

## 2015-01-05 LAB — COMPREHENSIVE METABOLIC PANEL
ALT: 22 U/L (ref 17–63)
ANION GAP: 6 (ref 5–15)
AST: 40 U/L (ref 15–41)
Albumin: 4.2 g/dL (ref 3.5–5.0)
Alkaline Phosphatase: 89 U/L (ref 38–126)
BUN: 8 mg/dL (ref 6–20)
CHLORIDE: 108 mmol/L (ref 101–111)
CO2: 25 mmol/L (ref 22–32)
Calcium: 8.8 mg/dL — ABNORMAL LOW (ref 8.9–10.3)
Creatinine, Ser: 0.61 mg/dL (ref 0.61–1.24)
Glucose, Bld: 98 mg/dL (ref 65–99)
POTASSIUM: 3.6 mmol/L (ref 3.5–5.1)
Sodium: 139 mmol/L (ref 135–145)
Total Bilirubin: 6.4 mg/dL — ABNORMAL HIGH (ref 0.3–1.2)
Total Protein: 7.3 g/dL (ref 6.5–8.1)

## 2015-01-05 LAB — CBC WITH DIFFERENTIAL/PLATELET
BASOS ABS: 0.2 10*3/uL — AB (ref 0.0–0.1)
Basophils Relative: 1 %
EOS PCT: 4 %
Eosinophils Absolute: 0.7 10*3/uL (ref 0.0–0.7)
HCT: 23.1 % — ABNORMAL LOW (ref 39.0–52.0)
HEMOGLOBIN: 7.8 g/dL — AB (ref 13.0–17.0)
LYMPHS ABS: 2.5 10*3/uL (ref 0.7–4.0)
LYMPHS PCT: 15 %
MCH: 30 pg (ref 26.0–34.0)
MCHC: 33.8 g/dL (ref 30.0–36.0)
MCV: 88.8 fL (ref 78.0–100.0)
MONOS PCT: 13 %
Monocytes Absolute: 2.2 10*3/uL — ABNORMAL HIGH (ref 0.1–1.0)
NEUTROS PCT: 67 %
Neutro Abs: 11.1 10*3/uL — ABNORMAL HIGH (ref 1.7–7.7)
Platelets: 444 10*3/uL — ABNORMAL HIGH (ref 150–400)
RBC: 2.6 MIL/uL — AB (ref 4.22–5.81)
RDW: 19.5 % — ABNORMAL HIGH (ref 11.5–15.5)
WBC: 16.7 10*3/uL — AB (ref 4.0–10.5)
nRBC: 1 /100 WBC — ABNORMAL HIGH

## 2015-01-05 LAB — RETICULOCYTES
RBC.: 2.6 MIL/uL — AB (ref 4.22–5.81)
RETIC CT PCT: 14.7 % — AB (ref 0.4–3.1)
Retic Count, Absolute: 382.2 10*3/uL — ABNORMAL HIGH (ref 19.0–186.0)

## 2015-01-05 MED ORDER — HEPARIN SOD (PORK) LOCK FLUSH 100 UNIT/ML IV SOLN
500.0000 [IU] | INTRAVENOUS | Status: AC | PRN
  Administered 2015-01-05: 500 [IU]
  Filled 2015-01-05: qty 5

## 2015-01-05 MED ORDER — ONDANSETRON HCL 4 MG/2ML IJ SOLN: 4.0000 mg | Freq: Four times a day (QID) | INTRAMUSCULAR | Status: DC | PRN

## 2015-01-05 MED ORDER — KETOROLAC TROMETHAMINE 30 MG/ML IJ SOLN
30.0000 mg | Freq: Once | INTRAMUSCULAR | Status: AC
  Administered 2015-01-05: 30 mg via INTRAVENOUS
  Filled 2015-01-05: qty 1

## 2015-01-05 MED ORDER — SODIUM CHLORIDE 0.9 % IJ SOLN
10.0000 mL | INTRAMUSCULAR | Status: AC | PRN
  Administered 2015-01-05: 10 mL

## 2015-01-05 MED ORDER — DEXTROSE-NACL 5-0.45 % IV SOLN
INTRAVENOUS | Status: DC
  Administered 2015-01-05: 10:00:00 via INTRAVENOUS

## 2015-01-05 MED ORDER — SODIUM CHLORIDE 0.9 % IV SOLN
12.5000 mg | Freq: Four times a day (QID) | INTRAVENOUS | Status: DC | PRN
  Filled 2015-01-05: qty 0.25

## 2015-01-05 MED ORDER — SODIUM CHLORIDE 0.9 % IJ SOLN: 9.0000 mL | INTRAMUSCULAR | Status: DC | PRN

## 2015-01-05 MED ORDER — DIPHENHYDRAMINE HCL 12.5 MG/5ML PO ELIX
12.5000 mg | ORAL_SOLUTION | Freq: Four times a day (QID) | ORAL | Status: DC | PRN
  Administered 2015-01-05: 12.5 mg via ORAL
  Filled 2015-01-05: qty 5

## 2015-01-05 MED ORDER — HYDROMORPHONE 1 MG/ML IV SOLN
INTRAVENOUS | Status: DC
Start: 2015-01-05 — End: 2015-01-05
  Administered 2015-01-05: 16.8 mg via INTRAVENOUS
  Administered 2015-01-05: 0.7 mg via INTRAVENOUS
  Administered 2015-01-05: 10:00:00 via INTRAVENOUS
  Filled 2015-01-05: qty 25

## 2015-01-05 MED ORDER — NALOXONE HCL 0.4 MG/ML IJ SOLN: 0.4000 mg | INTRAMUSCULAR | Status: DC | PRN

## 2015-01-05 NOTE — Progress Notes (Signed)
Pt received to the [facility] hospital for treatment of crises. Pt stated pain was 8 on admission but now 4. Pt treated with IV fluids, Dilaudid PCA, and toradol. Pt's O2 dropped to 88, pt place on O2 at 2l/[place]. Reminded to cont to use oxygen at home. Discharge instructions given to patient with verbal understanding. Pt discharged to home, alert, oriented and ambulatory.

## 2015-01-05 NOTE — Telephone Encounter (Signed)
Pt called to come to day hospital. States pain is in his back and both sides and is #8. Last pain med was an hour ago, Dilaudid 4mg . No nausea, vomiting, diarrhea, abd pain or priapism. Will check with provider and call him back. Pt voiced understanding. Pt called back and told him that he can come to the day hospital for treatment. Pt voiced understanding and that he could be here in 45 mins.

## 2015-01-05 NOTE — H&P (Signed)
[facility] History and Physical   Date: 01/05/2015  Patient name: [name] Medical record number: [id number] Date of birth: 07/17/1980 Age: 34 y.o. Gender: male PCP: Gerald Chessman, MD  Attending physician: Tresa Garter, MD  Chief Complaint: Bilateral Rib Pain  History of Present Illness: [name] is a 34 year old male with a history of sickle cell anemia,HbSS who presents to the day infusion center for pain related to sickle cell anemia. Patient was recently discharged from inpatient services on 01/01/2015. He states that pain intensity started increasing on Friday. He maintains that he worked through the pain over the weekend. Current pain intensity is described as 8/10, constant and throbbing. He states that he did not wear home oxygen over the weekend while working. He las had Dilaudid 4 mg this am with minimal relief.  He denies any fever, chills, chest pain, shortness of breath, cough, abdominal pain, nausea, vomiting, or diarrhea.  Meds: Prescriptions prior to admission  Medication Sig Dispense Refill Last Dose  . aspirin 81 MG chewable tablet Chew 1 tablet (81 mg total) by mouth daily. 30 tablet 11 01/05/2015 at Unknown time  . Cholecalciferol (VITAMIN D) 2000 UNITS tablet Take 1 tablet (2,000 Units total) by mouth daily. 90 tablet 3 01/05/2015 at Unknown time  . enoxaparin (LOVENOX) 120 MG/0.8ML injection Inject 0.8 mLs (120 mg total) into the skin daily. 30 Syringe 2 01/05/2015 at Unknown time  . folic acid (FOLVITE) 1 MG tablet Take 1 tablet (1 mg total) by mouth every morning. 30 tablet 11 01/05/2015 at Unknown time  . HYDROmorphone (DILAUDID) 4 MG tablet Take 1 tablet (4 mg total) by mouth every 4 (four) hours as needed for severe pain. 90 tablet 0 01/05/2015 at Unknown time  . hydroxyurea (HYDREA) 500 MG capsule Take 1 capsule (500 mg total) by mouth daily. May take with food to minimize GI side effects. 30 capsule 0 01/05/2015 at Unknown  time  . lisinopril (PRINIVIL,ZESTRIL) 10 MG tablet Take 1 tablet (10 mg total) by mouth daily. 30 tablet 1 01/05/2015 at Unknown time  . morphine (MS CONTIN) 30 MG 12 hr tablet Take 1 tablet (30 mg total) by mouth every 12 (twelve) hours. 60 tablet 0 01/04/2015 at Unknown time  . potassium chloride SA (K-DUR,KLOR-CON) 20 MEQ tablet Take 1 tablet (20 mEq total) by mouth every morning. 30 tablet 3 01/05/2015 at Unknown time  . zolpidem (AMBIEN) 10 MG tablet Take 1 tablet (10 mg total) by mouth at bedtime as needed for sleep. 30 tablet 0 Past Week at Unknown time  . metoprolol succinate (TOPROL-XL) 50 MG 24 hr tablet Take 1 tablet (50 mg total) by mouth daily. 30 tablet 1 Unknown at Unknown time    Allergies: Review of patient's allergies indicates no known allergies. Past Medical History  Diagnosis Date  . Sickle cell anemia (HCC)   . Blood transfusion   . Acute embolism and thrombosis of right internal jugular vein ([place])   . Hypokalemia   . Mood disorder ([place])   . History of pulmonary embolus ([facility])   . Avascular necrosis (HCC)     Right Hip  . Leukocytosis     Chronic  . Thrombocytosis (HCC)     Chronic  . Hypertension   . History of Clostridium difficile infection   . Uses marijuana   . Chronic anticoagulation   . Functional asplenia   . Former smoker   . Second hand tobacco smoke exposure   .  Alcohol consumption of one to four drinks per day   . Noncompliance with medication regimen   . Sickle-cell crisis with associated acute chest syndrome ([place]) 05/13/2013  . Acute chest syndrome ([place]) 06/18/2013  . Demand ischemia ([place]) 01/02/2014  . Pulmonary hypertension [facility])    Past Surgical History  Procedure Laterality Date  . Right hip replacement      08/2006  . Cholecystectomy      01/2008  . Porta cath placement    . Porta cath removal    . Umbilical hernia repair      01/2008  . Excision of left periauricular cyst      10/2009  . Excision of right ear lobe cyst with primary  closur      11/2007  . Portacath placement  01/05/2012    Procedure: INSERTION PORT-A-CATH;  Surgeon: Odis Hollingshead, MD;  Location: [facility];  Service: General;  Laterality: N/A;  ultrasound guiced port a cath insertion with fluoroscopy   Family History  Problem Relation Age of Onset  . Sickle cell trait Mother   . Depression Mother   . Diabetes Mother   . Sickle cell trait Father   . Sickle cell trait Brother    Social History   Social History  . Marital Status: Single    Spouse Name: N/A  . Number of Children: 0  . Years of Education: 13   Occupational History  . 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 Smoking status: Former Smoker -- 13 years  . Smokeless tobacco: Never Used  . Alcohol Use: No  . Drug Use: 2.00 per week    Special: Marijuana     Comment: Marijuana weekly  . Sexual Activity:    Partners: Female    Museum/gallery curator: None     Comment: month ago   Other Topics Concern  . Not on file   Social History Narrative   Lives in an apartment.  Single.  Lives alone but has a girlfriend that helps care for him.  Does not use any assist devices.        [name]  [phone] Mom, emergency contact      [facility]:   Patient continuing to live in her apartment in town alone. 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    Review of Systems: Constitutional: negative except for fatigue Eyes: positive for icterus Ears, nose, mouth, throat, and face: negative Respiratory: positive for dyspnea on exertion Cardiovascular: negative Gastrointestinal: negative Genitourinary:negative Musculoskeletal:positive for arthralgias and back pain Neurological: negative Behavioral/Psych: negative Endocrine: negative Allergic/Immunologic: negative  Physical Exam: Blood pressure 117/70, pulse 91, temperature 98.8 F (37.1 C), resp. rate 20, height 6' (1.829 m), weight 165 lb (74.844 kg), SpO2 94 %. BP  117/70 mmHg  Pulse 91  Temp(Src) 98.8 F (37.1 C)  Resp 20  Ht 6' (1.829 m)  Wt 165 lb (74.844 kg)  BMI 22.37 kg/m2  SpO2 94%  General Appearance:    Alert, cooperative, mild distress, appears older than stated age  Head:    Normocephalic, without obvious abnormality, atraumatic  Eyes:    PERRL, conjunctiva/corneas clear, EOM's intact, scleral icterus bilaterally    Ears:    Normal TM's and external ear canals, both ears  Nose:   Nares normal, septum midline, mucosa normal, no drainage    or sinus tenderness  Throat:   Lips, mucosa, and tongue  normal; teeth and gums normal  Neck:   Supple, symmetrical, trachea midline, no adenopathy;       thyroid:  No enlargement/tenderness/nodules; no carotid   bruit or JVD  Back:     Symmetric, no curvature, ROM normal, no CVA tenderness  Lungs:     Clear to auscultation bilaterally, respirations unlabored. Lungs decreased in bases  Chest wall:    No tenderness or deformity. Left port a cath  Heart:    Irregular rate and regular rhythm  Abdomen:     Soft, non-tender, bowel sounds active all four quadrants,    no masses, no organomegaly  Extremities:   Extremities normal, atraumatic, Left anterior leg, trace edema with tenderness to palpation  Pulses:   2+ and symmetric all extremities  Skin:   Skin color, texture, turgor normal, no rashes or lesions  Lymph nodes:   Cervical, supraclavicular, and axillary nodes normal  Neurologic:   CNII-XII intact. Normal strength, sensation and reflexes      throughout    Lab results: No results found. However, due to the size of the patient record, not all encounters were searched. Please check Results Review for a complete set of results.  Imaging results:  No results found. Assessment & Plan:  Patient will be admitted to the day infusion center for extended observation  Start IV D5.45 for cellular rehydration at 125/hr  Start Toradol 30 mg IV times 1 for inflammation.  Start Dilaudid PCA High  Concentration per weight based protocol.   Patient will be re-evaluated for pain intensity in the context of function and relationship to baseline as care progresses.   Will check CMP, reticulocytes,  and CBC w/differential  Chrisa Hassan M 01/05/2015, 9:45 AM

## 2015-01-05 NOTE — Discharge Summary (Signed)
[facility] Discharge Summary   Patient ID: [name] MRN: [id number] DOB/AGE: 08-20-1980 34 y.o.  Admit date: 01/05/2015 Discharge date: 01/05/2015  Primary Care Physician:  [name], MD  Admission Diagnoses:  Active Problems:   Sickle-cell disease with pain ([place])   Discharge Medications:    Medication List    TAKE these medications        aspirin 81 MG chewable tablet  Chew 1 tablet (81 mg total) by mouth daily.     enoxaparin 120 MG/0.8ML injection  Commonly known as:  LOVENOX  Inject 0.8 mLs (120 mg total) into the skin daily.     folic acid 1 MG tablet  Commonly known as:  FOLVITE  Take 1 tablet (1 mg total) by mouth every morning.     HYDROmorphone 4 MG tablet  Commonly known as:  DILAUDID  Take 1 tablet (4 mg total) by mouth every 4 (four) hours as needed for severe pain.     hydroxyurea 500 MG capsule  Commonly known as:  HYDREA  Take 1 capsule (500 mg total) by mouth daily. May take with food to minimize GI side effects.     lisinopril 10 MG tablet  Commonly known as:  PRINIVIL,ZESTRIL  Take 1 tablet (10 mg total) by mouth daily.     metoprolol succinate 50 MG 24 hr tablet  Commonly known as:  TOPROL-XL  Take 1 tablet (50 mg total) by mouth daily.     morphine 30 MG 12 hr tablet  Commonly known as:  MS CONTIN  Take 1 tablet (30 mg total) by mouth every 12 (twelve) hours.     potassium chloride SA 20 MEQ tablet  Commonly known as:  K-DUR,KLOR-CON  Take 1 tablet (20 mEq total) by mouth every morning.     Vitamin D 2000 UNITS tablet  Take 1 tablet (2,000 Units total) by mouth daily.     zolpidem 10 MG tablet  Commonly known as:  AMBIEN  Take 1 tablet (10 mg total) by mouth at bedtime as needed for sleep.         Consults:  None  Significant Diagnostic Studies:  Dg Chest 2 View  12/29/2014  CLINICAL DATA:  Sickle Cell Crisis; SOB;dyspnea with exertion; no chest pain; Former Smoker; Hx HTN; Pulmonary  Embolus; Patient has Port A Cath EXAM: CHEST  2 VIEW COMPARISON:  12/16/2014 FINDINGS: Left-sided Port-A-Cath tip to the lower superior vena cava. The heart is mildly enlarged. There has been some improvement in aeration of the lungs bilaterally. Very minimal opacities persist primarily in the right lower lobe. No frank consolidations. No pleural effusions or pulmonary edema. IMPRESSION: 1. Stable cardiomegaly. 2. Improved aeration. Electronically Signed   By: Nolon Nations M.D.   On: 12/29/2014 15:00    [facility] Course: -[name] was admitted to the day infusion center for extended observation.  -Started D5.45 for cellular re-hydration -Reviewed laboratory values, consistent with baseline -High Concentration PCA per weight based protocol for pain control. Patient used a total of 17.5 mg with 29 demands and  25 deliveries. He states that pain intensity is 4/10.   He maintains that he can function at home on current medication regimen. Patient is alert, oriented and ambulatory. He is encouraged to continue hydrating and consistent on medication regimen.    Physical Exam at Discharge:   BP 113/72 mmHg  Pulse 80  Temp(Src) 98.4 F (36.9 C)  Resp 20  Ht 6' (  1.829 m)  Wt 165 lb (74.844 kg)  BMI 22.37 kg/m2  SpO2 90%   General Appearance:    Alert, cooperative, no distress, appears stated age  Eyes:    PERRL, conjunctiva/corneas clear, EOM's intact, fundi    benign, both eyes       Back:     Symmetric, no curvature, ROM normal, no CVA tenderness  Lungs:     Clear to auscultation bilaterally, respirations unlabored  Chest wall:    No tenderness or deformity  Heart:    Irregular rate and rhythm, S1 and S2 normal, no murmur, rub   or gallop  Lymph nodes:   Cervical, supraclavicular, and axillary nodes normal  Neurologic:   CNII-XII intact. Normal strength, sensation and reflexes      throughout     Disposition at Discharge: 01-Home or Self Care  Discharge  Orders: Discharge Instructions    Discharge patient    Complete by:  As directed            Condition at Discharge:   Stable  Time spent on Discharge:  15 minutes  Signed: Melvie Paglia M 01/05/2015, 4:11 PM

## 2015-01-06 ENCOUNTER — Ambulatory Visit: Payer: Self-pay | Admitting: Internal Medicine

## 2015-01-08 ENCOUNTER — Encounter (HOSPITAL_COMMUNITY): Payer: Self-pay | Admitting: *Deleted

## 2015-01-08 ENCOUNTER — Inpatient Hospital Stay (HOSPITAL_COMMUNITY)
Admission: EM | Admit: 2015-01-08 | Discharge: 2015-01-10 | DRG: 811 | Discharge status: Against Medical Advice | Payer: Medicare Other | Attending: Internal Medicine | Admitting: Internal Medicine

## 2015-01-08 ENCOUNTER — Emergency Department (HOSPITAL_COMMUNITY): Payer: Medicare Other

## 2015-01-08 DIAGNOSIS — I2721 Secondary pulmonary arterial hypertension: Secondary | ICD-10-CM | POA: Diagnosis present

## 2015-01-08 DIAGNOSIS — I2781 Cor pulmonale (chronic): Secondary | ICD-10-CM | POA: Diagnosis present

## 2015-01-08 DIAGNOSIS — D5700 Hb-SS disease with crisis, unspecified: Secondary | ICD-10-CM

## 2015-01-08 LAB — RETICULOCYTES
RBC.: 2.54 MIL/uL — AB (ref 4.22–5.81)
RETIC COUNT ABSOLUTE: 510.5 10*3/uL — AB (ref 19.0–186.0)
Retic Ct Pct: 20.1 % — ABNORMAL HIGH (ref 0.4–3.1)

## 2015-01-08 LAB — CBC WITH DIFFERENTIAL/PLATELET
Basophils Absolute: 0.1 10*3/uL (ref 0.0–0.1)
Basophils Relative: 0 %
EOS ABS: 0.4 10*3/uL (ref 0.0–0.7)
EOS PCT: 2 %
HCT: 23.7 % — ABNORMAL LOW (ref 39.0–52.0)
Hemoglobin: 8.1 g/dL — ABNORMAL LOW (ref 13.0–17.0)
LYMPHS ABS: 2.1 10*3/uL (ref 0.7–4.0)
LYMPHS PCT: 10 %
MCH: 31.3 pg (ref 26.0–34.0)
MCHC: 34.2 g/dL (ref 30.0–36.0)
MCV: 91.5 fL (ref 78.0–100.0)
MONO ABS: 2.1 10*3/uL — AB (ref 0.1–1.0)
MONOS PCT: 10 %
Neutro Abs: 15.5 10*3/uL — ABNORMAL HIGH (ref 1.7–7.7)
Neutrophils Relative %: 78 %
Platelets: 474 10*3/uL — ABNORMAL HIGH (ref 150–400)
RBC: 2.59 MIL/uL — ABNORMAL LOW (ref 4.22–5.81)
RDW: 21 % — ABNORMAL HIGH (ref 11.5–15.5)
WBC: 20.1 10*3/uL — AB (ref 4.0–10.5)

## 2015-01-08 LAB — COMPREHENSIVE METABOLIC PANEL
ALBUMIN: 4.4 g/dL (ref 3.5–5.0)
ALT: 30 U/L (ref 17–63)
AST: 42 U/L — AB (ref 15–41)
Alkaline Phosphatase: 88 U/L (ref 38–126)
Anion gap: 9 (ref 5–15)
BUN: 9 mg/dL (ref 6–20)
CHLORIDE: 107 mmol/L (ref 101–111)
CO2: 23 mmol/L (ref 22–32)
CREATININE: 0.59 mg/dL — AB (ref 0.61–1.24)
Calcium: 9.2 mg/dL (ref 8.9–10.3)
GFR calc Af Amer: >60 mL/min (ref >60)
GLUCOSE: 106 mg/dL — AB (ref 65–99)
POTASSIUM: 3.5 mmol/L (ref 3.5–5.1)
Sodium: 139 mmol/L (ref 135–145)
Total Bilirubin: 7.3 mg/dL — ABNORMAL HIGH (ref 0.3–1.2)
Total Protein: 7.9 g/dL (ref 6.5–8.1)

## 2015-01-08 MED ORDER — DIPHENHYDRAMINE HCL 25 MG PO CAPS
25.0000 mg | ORAL_CAPSULE | Freq: Four times a day (QID) | ORAL | Status: DC | PRN
  Administered 2015-01-09 – 2015-01-10 (×5): 25 mg via ORAL
  Filled 2015-01-08 (×5): qty 1

## 2015-01-08 MED ORDER — ENOXAPARIN SODIUM 120 MG/0.8ML ~~LOC~~ SOLN
120.0000 mg | SUBCUTANEOUS | Status: DC
  Administered 2015-01-09 – 2015-01-10 (×2): 120 mg via SUBCUTANEOUS
  Filled 2015-01-08 (×2): qty 0.8

## 2015-01-08 MED ORDER — POLYETHYLENE GLYCOL 3350 17 G PO PACK: 17.0000 g | PACK | Freq: Every day | ORAL | Status: DC | PRN

## 2015-01-08 MED ORDER — ONDANSETRON HCL 4 MG/2ML IJ SOLN: 4.0000 mg | Freq: Three times a day (TID) | INTRAMUSCULAR | Status: DC | PRN

## 2015-01-08 MED ORDER — SODIUM CHLORIDE 0.9 % IJ SOLN: 9.0000 mL | INTRAMUSCULAR | Status: DC | PRN

## 2015-01-08 MED ORDER — HYDROMORPHONE 1 MG/ML IV SOLN
INTRAVENOUS | Status: DC
  Administered 2015-01-08: 23:00:00 via INTRAVENOUS
  Administered 2015-01-09: 5.92 mg via INTRAVENOUS
  Administered 2015-01-09: 6.6 mg via INTRAVENOUS
  Administered 2015-01-09: 08:00:00 via INTRAVENOUS
  Administered 2015-01-09: 8.88 mg via INTRAVENOUS
  Filled 2015-01-08 (×2): qty 25

## 2015-01-08 MED ORDER — HYDROMORPHONE HCL 2 MG/ML IJ SOLN
2.0000 mg | INTRAMUSCULAR | Status: DC | PRN
  Administered 2015-01-08 (×2): 2 mg via INTRAVENOUS
  Filled 2015-01-08 (×2): qty 1

## 2015-01-08 MED ORDER — POTASSIUM CHLORIDE CRYS ER 20 MEQ PO TBCR
20.0000 meq | EXTENDED_RELEASE_TABLET | Freq: Every morning | ORAL | Status: DC
  Administered 2015-01-09 – 2015-01-10 (×2): 20 meq via ORAL
  Filled 2015-01-08 (×2): qty 1

## 2015-01-08 MED ORDER — SODIUM CHLORIDE 0.45 % IV SOLN
INTRAVENOUS | Status: AC
  Administered 2015-01-08 – 2015-01-09 (×2): via INTRAVENOUS

## 2015-01-08 MED ORDER — HYDROMORPHONE HCL 1 MG/ML IJ SOLN
1.0000 mg | INTRAMUSCULAR | Status: DC | PRN
  Administered 2015-01-08: 1 mg via INTRAVENOUS
  Filled 2015-01-08: qty 1

## 2015-01-08 MED ORDER — FOLIC ACID 1 MG PO TABS
1.0000 mg | ORAL_TABLET | Freq: Every morning | ORAL | Status: DC
  Administered 2015-01-09 – 2015-01-10 (×2): 1 mg via ORAL
  Filled 2015-01-08 (×2): qty 1

## 2015-01-08 MED ORDER — VITAMIN D 1000 UNITS PO TABS
2000.0000 [IU] | ORAL_TABLET | Freq: Every day | ORAL | Status: DC
  Administered 2015-01-09 – 2015-01-10 (×2): 2000 [IU] via ORAL
  Filled 2015-01-08 (×2): qty 2

## 2015-01-08 MED ORDER — ZOLPIDEM TARTRATE 10 MG PO TABS
10.0000 mg | ORAL_TABLET | Freq: Every evening | ORAL | Status: DC | PRN
  Administered 2015-01-08 – 2015-01-09 (×2): 10 mg via ORAL
  Filled 2015-01-08 (×2): qty 1

## 2015-01-08 MED ORDER — ASPIRIN 81 MG PO CHEW
81.0000 mg | CHEWABLE_TABLET | Freq: Every day | ORAL | Status: DC
  Administered 2015-01-09 – 2015-01-10 (×2): 81 mg via ORAL
  Filled 2015-01-08 (×2): qty 1

## 2015-01-08 MED ORDER — METOPROLOL SUCCINATE ER 50 MG PO TB24
50.0000 mg | ORAL_TABLET | Freq: Every day | ORAL | Status: DC
  Administered 2015-01-09 – 2015-01-10 (×2): 50 mg via ORAL
  Filled 2015-01-08 (×2): qty 1

## 2015-01-08 MED ORDER — HYDROXYUREA 500 MG PO CAPS
500.0000 mg | ORAL_CAPSULE | Freq: Every day | ORAL | Status: DC
  Administered 2015-01-09 – 2015-01-10 (×2): 500 mg via ORAL
  Filled 2015-01-08 (×2): qty 1

## 2015-01-08 MED ORDER — HYDROMORPHONE HCL 1 MG/ML IJ SOLN
1.0000 mg | Freq: Once | INTRAMUSCULAR | Status: AC
  Administered 2015-01-08: 1 mg via INTRAVENOUS
  Filled 2015-01-08: qty 1

## 2015-01-08 MED ORDER — HYDROMORPHONE HCL 1 MG/ML IJ SOLN: 1.0000 mg | INTRAMUSCULAR | Status: DC | PRN

## 2015-01-08 MED ORDER — DIPHENHYDRAMINE HCL 50 MG/ML IJ SOLN
25.0000 mg | Freq: Once | INTRAMUSCULAR | Status: AC
  Administered 2015-01-08: 25 mg via INTRAVENOUS
  Filled 2015-01-08: qty 1

## 2015-01-08 MED ORDER — MORPHINE SULFATE ER 30 MG PO TBCR
30.0000 mg | EXTENDED_RELEASE_TABLET | Freq: Two times a day (BID) | ORAL | Status: DC
  Administered 2015-01-08 – 2015-01-10 (×4): 30 mg via ORAL
  Filled 2015-01-08 (×4): qty 1

## 2015-01-08 MED ORDER — NALOXONE HCL 0.4 MG/ML IJ SOLN: 0.4000 mg | INTRAMUSCULAR | Status: DC | PRN

## 2015-01-08 MED ORDER — SENNOSIDES-DOCUSATE SODIUM 8.6-50 MG PO TABS
1.0000 | ORAL_TABLET | Freq: Two times a day (BID) | ORAL | Status: DC
  Administered 2015-01-08 – 2015-01-10 (×4): 1 via ORAL
  Filled 2015-01-08 (×4): qty 1

## 2015-01-08 MED ORDER — LISINOPRIL 10 MG PO TABS
10.0000 mg | ORAL_TABLET | Freq: Every day | ORAL | Status: DC
  Administered 2015-01-09 – 2015-01-10 (×2): 10 mg via ORAL
  Filled 2015-01-08 (×2): qty 1

## 2015-01-08 MED ORDER — ONDANSETRON HCL 4 MG/2ML IJ SOLN
4.0000 mg | Freq: Four times a day (QID) | INTRAMUSCULAR | Status: DC | PRN
  Administered 2015-01-09: 4 mg via INTRAVENOUS
  Filled 2015-01-08: qty 2

## 2015-01-08 MED ORDER — KETOROLAC TROMETHAMINE 30 MG/ML IJ SOLN
15.0000 mg | Freq: Once | INTRAMUSCULAR | Status: AC
  Administered 2015-01-08: 15 mg via INTRAVENOUS
  Filled 2015-01-08: qty 1

## 2015-01-08 MED ORDER — FOLIC ACID 1 MG PO TABS: 1.0000 mg | ORAL_TABLET | Freq: Every day | ORAL | Status: DC

## 2015-01-08 NOTE — ED Notes (Signed)
Pt with hx of sickle cell cell complains of 8/10 pain in bilateral ribs since waking up this morning. Pt states this feels like a sickle cell pain crisis. Pt denies nausea/vomiting/shortness of breath.

## 2015-01-08 NOTE — ED Provider Notes (Signed)
CSN: [id number]     Arrival date & time 01/08/15  1640 History   First MD Initiated Contact with Patient 01/08/15 1702     Chief Complaint  Patient presents with  . Sickle Cell Pain Crisis     HPI Pt with hx of sickle cell cell complains of 8/10 pain in bilateral ribs since waking up this morning. Pt states this feels like a sickle cell pain crisis. Pt denies nausea/vomiting/shortness of breath. Past Medical History  Diagnosis Date  . Sickle cell anemia (HCC)   . Blood transfusion   . Acute embolism and thrombosis of right internal jugular vein ([place])   . Hypokalemia   . Mood disorder ([place])   . History of pulmonary embolus (PE)   . Avascular necrosis (HCC)     Right Hip  . Leukocytosis     Chronic  . Thrombocytosis (HCC)     Chronic  . Hypertension   . History of Clostridium difficile infection   . Uses marijuana   . Chronic anticoagulation   . Functional asplenia   . Former smoker   . Second hand tobacco smoke exposure   . Alcohol consumption of one to four drinks per day   . Noncompliance with medication regimen   . Sickle-cell crisis with associated acute chest syndrome ([place]) 05/13/2013  . Acute chest syndrome ([place]) 06/18/2013  . Demand ischemia ([place]) 01/02/2014  . Pulmonary hypertension [facility])    Past Surgical History  Procedure Laterality Date  . Right hip replacement      08/2006  . Cholecystectomy      01/2008  . Porta cath placement    . Porta cath removal    . Umbilical hernia repair      01/2008  . Excision of left periauricular cyst      10/2009  . Excision of right ear lobe cyst with primary closur      11/2007  . Portacath placement  01/05/2012    Procedure: INSERTION PORT-A-CATH;  Surgeon: Odis Hollingshead, MD;  Location: [place];  Service: General;  Laterality: N/A;  ultrasound guiced port a cath insertion with fluoroscopy   Family History  Problem Relation Age of Onset  . Sickle cell trait Mother   . Depression Mother   . Diabetes Mother   . Sickle  cell trait Father   . Sickle cell trait Brother    Social History  Substance Use Topics  . Smoking status: Former Smoker -- 13 years  . Smokeless tobacco: Never Used  . Alcohol Use: No    Review of Systems  Constitutional: Negative for fever.      Allergies  Review of patient's allergies indicates no known allergies.  Home Medications   Prior to Admission medications   Medication Sig Start Date End Date Taking? Authorizing Provider  aspirin 81 MG chewable tablet Chew 1 tablet (81 mg total) by mouth daily. 07/23/14  Yes [name], MD  Cholecalciferol (VITAMIN D) 2000 UNITS tablet Take 1 tablet (2,000 Units total) by mouth daily. 02/13/14  Yes [name], MD  enoxaparin (LOVENOX) 120 MG/0.8ML injection Inject 0.8 mLs (120 mg total) into the skin daily. 12/03/14  Yes [name], MD  folic acid (FOLVITE) 1 MG tablet Take 1 tablet (1 mg total) by mouth every morning. 10/08/13  Yes [name], MD  HYDROmorphone (DILAUDID) 4 MG tablet Take 1 tablet (4 mg total) by mouth every 4 (four) hours as needed for severe pain. 12/23/14  Yes  Dorena Dew, FNP  hydroxyurea (HYDREA) 500 MG capsule Take 1 capsule (500 mg total) by mouth daily. May take with food to minimize GI side effects. 11/06/14  Yes Dorena Dew, FNP  lisinopril (PRINIVIL,ZESTRIL) 10 MG tablet Take 1 tablet (10 mg total) by mouth daily. 01/22/14  Yes [name], MD  metoprolol succinate (TOPROL-XL) 50 MG 24 hr tablet Take 1 tablet (50 mg total) by mouth daily. 11/28/14  Yes [name], MD  morphine (MS CONTIN) 30 MG 12 hr tablet Take 1 tablet (30 mg total) by mouth every 12 (twelve) hours. 12/23/14  Yes Dorena Dew, FNP  potassium chloride SA (K-DUR,KLOR-CON) 20 MEQ tablet Take 1 tablet (20 mEq total) by mouth every morning. 06/09/14  Yes [name], MD  zolpidem (AMBIEN) 10 MG tablet Take 1 tablet (10 mg total) by mouth at bedtime as needed for sleep. 12/23/14   Yes Dorena Dew, FNP   BP 116/79 mmHg  Pulse 82  Temp(Src) 98 F (36.7 C) (Oral)  Resp 18  Ht 6' (1.829 m)  Wt 165 lb (74.844 kg)  BMI 22.37 kg/m2  SpO2 90% Physical Exam  Constitutional: He is oriented to person, place, and time. He appears well-developed and well-nourished. No distress.  HENT:  Head: Normocephalic and atraumatic.  Eyes: Pupils are equal, round, and reactive to light. Scleral icterus is present.  Neck: Normal range of motion.  Cardiovascular: Normal rate and intact distal pulses.   Pulmonary/Chest: No respiratory distress. He has no wheezes.  Abdominal: Soft. Normal appearance and bowel sounds are normal. He exhibits no distension.  Musculoskeletal: Normal range of motion.  Neurological: He is alert and oriented to person, place, and time. No cranial nerve deficit.  Skin: Skin is warm and dry. No rash noted.  Psychiatric: He has a normal mood and affect. His behavior is normal.  Nursing note and vitals reviewed.   ED Course  Procedures (including critical care time) Medications  HYDROmorphone (DILAUDID) injection 2 mg (2 mg Intravenous Given 01/08/15 1933)  ketorolac (TORADOL) 30 MG/ML injection 15 mg (15 mg Intravenous Given 01/08/15 1727)  diphenhydrAMINE (BENADRYL) injection 25 mg (25 mg Intravenous Given 01/08/15 1729)  HYDROmorphone (DILAUDID) injection 1 mg (1 mg Intravenous Given 01/08/15 1818)    Labs Review Labs Reviewed  COMPREHENSIVE METABOLIC PANEL - Abnormal; Notable for the following:    Glucose, Bld 106 (*)    Creatinine, Ser 0.59 (*)    AST 42 (*)    Total Bilirubin 7.3 (*)    All other components within normal limits  CBC WITH DIFFERENTIAL/PLATELET - Abnormal; Notable for the following:    WBC 20.1 (*)    RBC 2.59 (*)    Hemoglobin 8.1 (*)    HCT 23.7 (*)    RDW 21.0 (*)    Platelets 474 (*)    Neutro Abs 15.5 (*)    Monocytes Absolute 2.1 (*)    All other components within normal limits  RETICULOCYTES - Abnormal; Notable for  the following:    Retic Ct Pct 20.1 (*)    RBC. 2.54 (*)    Retic Count, Manual 510.5 (*)    All other components within normal limits    Imaging Review Dg Chest Portable 1 View  01/08/2015  CLINICAL DATA:  Acute onset of bilateral chest and rib pain. Initial encounter. EXAM: PORTABLE CHEST 1 VIEW COMPARISON:  Chest radiograph performed 12/29/2014 FINDINGS: The lungs are well-aerated. Vascular congestion is noted. There is no  evidence of focal opacification, pleural effusion or pneumothorax. The cardiomediastinal silhouette is mildly enlarged. A left-sided chest port is noted ending about the cavoatrial junction. No acute osseous abnormalities are seen. IMPRESSION: Vascular congestion and mild cardiomegaly. Lungs remain grossly clear. No displaced rib fracture seen. Electronically Signed   By: Garald Balding M.D.   On: 01/08/2015 19:18   I have personally reviewed and evaluated these images and lab results as part of my medical decision-making.    MDM   Final diagnoses:  Hb-SS disease with crisis [facility])        Leonard Schwartz, MD 01/08/15 [phone]

## 2015-01-08 NOTE — H&P (Signed)
[facility] History and Physical  [name] [id number] DOB: May 30, 1980 DOA: 01/08/2015  Referring physician: Dr. Audie Pinto. PCP: [name], MD  Specialists: Dr. Liston Alba.  Chief Complaint: Rib pain and low back pain.  HPI: [name] is a 34 y.o. male with history of chronic respiratory failure on home oxygen, PE on Lovenox and sickle cell disease presents to the ER because of pain around the both ribs and low back which patient states is typical of his sickle cell pain crisis. Denies any fever chills or productive cough. In the ER chest x-ray shows some congestion. Patient has been admitted for sickle cell pain crisis. On exam patient is appearing nonfocal denies any headache or visual symptoms.   Review of Systems: As presented in the history of presenting illness, rest negative.  Past Medical History  Diagnosis Date  . Sickle cell anemia (HCC)   . Blood transfusion   . Acute embolism and thrombosis of right internal jugular vein ([place])   . Hypokalemia   . Mood disorder ([place])   . History of pulmonary embolus (PE)   . Avascular necrosis (HCC)     Right Hip  . Leukocytosis     Chronic  . Thrombocytosis (HCC)     Chronic  . Hypertension   . History of Clostridium difficile infection   . Uses marijuana   . Chronic anticoagulation   . Functional asplenia   . Former smoker   . Second hand tobacco smoke exposure   . Alcohol consumption of one to four drinks per day   . Noncompliance with medication regimen   . Sickle-cell crisis with associated acute chest syndrome ([place]) 05/13/2013  . Acute chest syndrome ([place]) 06/18/2013  . Demand ischemia ([place]) 01/02/2014  . Pulmonary hypertension [facility])    Past Surgical History  Procedure Laterality Date  . Right hip replacement      08/2006  . Cholecystectomy      01/2008  . Porta cath placement    . Porta cath removal    . Umbilical hernia repair      01/2008  . Excision of left periauricular cyst       10/2009  . Excision of right ear lobe cyst with primary closur      11/2007  . Portacath placement  01/05/2012    Procedure: INSERTION PORT-A-CATH;  Surgeon: Odis Hollingshead, MD;  Location: [place];  Service: General;  Laterality: N/A;  ultrasound guiced port a cath insertion with fluoroscopy   Social History:  reports that he has quit smoking. He has never used smokeless tobacco. He reports that he uses illicit drugs (Marijuana) about twice per week. He reports that he does not drink alcohol. Where does patient live home. Can patient participate in ADLs? Yes.  No Known Allergies  Family History:  Family History  Problem Relation Age of Onset  . Sickle cell trait Mother   . Depression Mother   . Diabetes Mother   . Sickle cell trait Father   . Sickle cell trait Brother       Prior to Admission medications   Medication Sig Start Date End Date Taking? Authorizing Provider  aspirin 81 MG chewable tablet Chew 1 tablet (81 mg total) by mouth daily. 07/23/14  Yes [name], MD  Cholecalciferol (VITAMIN D) 2000 UNITS tablet Take 1 tablet (2,000 Units total) by mouth daily. 02/13/14  Yes [name], MD  enoxaparin (LOVENOX) 120 MG/0.8ML injection Inject 0.8 mLs (120 mg total)  into the skin daily. 12/03/14  Yes [name], MD  folic acid (FOLVITE) 1 MG tablet Take 1 tablet (1 mg total) by mouth every morning. 10/08/13  Yes [name], MD  HYDROmorphone (DILAUDID) 4 MG tablet Take 1 tablet (4 mg total) by mouth every 4 (four) hours as needed for severe pain. 12/23/14  Yes Dorena Dew, FNP  hydroxyurea (HYDREA) 500 MG capsule Take 1 capsule (500 mg total) by mouth daily. May take with food to minimize GI side effects. 11/06/14  Yes Dorena Dew, FNP  lisinopril (PRINIVIL,ZESTRIL) 10 MG tablet Take 1 tablet (10 mg total) by mouth daily. 01/22/14  Yes [name], MD  metoprolol succinate (TOPROL-XL) 50 MG 24 hr tablet Take 1 tablet (50 mg total)  by mouth daily. 11/28/14  Yes [name], MD  morphine (MS CONTIN) 30 MG 12 hr tablet Take 1 tablet (30 mg total) by mouth every 12 (twelve) hours. 12/23/14  Yes Dorena Dew, FNP  potassium chloride SA (K-DUR,KLOR-CON) 20 MEQ tablet Take 1 tablet (20 mEq total) by mouth every morning. 06/09/14  Yes [name], MD  zolpidem (AMBIEN) 10 MG tablet Take 1 tablet (10 mg total) by mouth at bedtime as needed for sleep. 12/23/14  Yes Dorena Dew, FNP    Physical Exam: Filed Vitals:   01/08/15 1656 01/08/15 1701 01/08/15 1936 01/08/15 2231  BP: 131/82  116/79 118/82  Pulse: 102  82 82  Temp: 98 F (36.7 C)   98.1 F (36.7 C)  TempSrc: Oral   Oral  Resp: 19  18 18   Height:  6' (1.829 m)    Weight:  74.844 kg (165 lb)    SpO2: 91%  90% 91%     General:  Moderately built and nourished.  Eyes: Anicteric no pallor.  ENT: No discharge from the ears eyes nose and mouth.  Neck: No mass felt.  Cardiovascular: S1-S2 heard.  Respiratory: No rhonchi or crepitations.  Abdomen: Soft nontender bowel sounds present.  Skin: No rash.  Musculoskeletal: No edema.  Psychiatric: Appears normal.  Neurologic: Alert awake oriented to time place and person. Moves all extremities.  Labs on Admission:  Basic Metabolic Panel:  Recent Labs Lab 01/05/15 1000 01/08/15 1716  NA 139 139  K 3.6 3.5  CL 108 107  CO2 25 23  GLUCOSE 98 106*  BUN 8 9  CREATININE 0.61 0.59*  CALCIUM 8.8* 9.2   Liver Function Tests:  Recent Labs Lab 01/05/15 1000 01/08/15 1716  AST 40 42*  ALT 22 30  ALKPHOS 89 88  BILITOT 6.4* 7.3*  PROT 7.3 7.9  ALBUMIN 4.2 4.4   No results for input(s): LIPASE, AMYLASE in the last 168 hours. No results for input(s): AMMONIA in the last 168 hours. CBC:  Recent Labs Lab 01/05/15 1000 01/08/15 1716  WBC 16.7* 20.1*  NEUTROABS 11.1* 15.5*  HGB 7.8* 8.1*  HCT 23.1* 23.7*  MCV 88.8 91.5  PLT 444* 474*   Cardiac Enzymes: No results for  input(s): CKTOTAL, CKMB, CKMBINDEX, TROPONINI in the last 168 hours.  BNP (last 3 results)  Recent Labs  08/29/14 0810 09/10/14 0612 11/30/14 0910  BNP 1117.0* 549.5* 639.7*    ProBNP (last 3 results) No results for input(s): PROBNP in the last 8760 hours.  CBG: No results for input(s): GLUCAP in the last 168 hours.  Radiological Exams on Admission: Dg Chest Portable 1 View  01/08/2015  CLINICAL DATA:  Acute onset of  bilateral chest and rib pain. Initial encounter. EXAM: PORTABLE CHEST 1 VIEW COMPARISON:  Chest radiograph performed 12/29/2014 FINDINGS: The lungs are well-aerated. Vascular congestion is noted. There is no evidence of focal opacification, pleural effusion or pneumothorax. The cardiomediastinal silhouette is mildly enlarged. A left-sided chest port is noted ending about the cavoatrial junction. No acute osseous abnormalities are seen. IMPRESSION: Vascular congestion and mild cardiomegaly. Lungs remain grossly clear. No displaced rib fracture seen. Electronically Signed   By: Garald Balding M.D.   On: 01/08/2015 19:18     Assessment/Plan Principal Problem:   Sickle cell crisis (HCC) Active Problems:   PAH (pulmonary artery hypertension) (HCC)   Cor pulmonale, chronic (HCC)   Sickle cell pain crisis ([place])   Pulmonary embolus ([place])   1. Sickle cell pain crisis - patient has been placed on weight-based Dilaudid PCA. Will continue patient's long-acting pain relief medications. Patient is on hydroxyurea which will need to be held if there is further decline in hemoglobin. 2. Hypertension - continue lisinopril. 3. Chronic respiratory failure with cor pulmonale - on home O2. Appears compensated. 4. History of PE on Lovenox. 5. Chronic anemia - follow CBC see #1.  I have reviewed patient's old charts of labs and personally reviewed x-ray.   DVT Prophylaxis Lovenox.  Code Status: Full code.  Family Communication: Discussed with patient.  Disposition Plan: Admit to  inpatient.    Alessandria Henken N. [facility] Pager [phone].  If 7PM-7AM, please contact night-coverage [email or web address] Password [email or web address] 01/08/2015, 10:35 PM

## 2015-01-09 DIAGNOSIS — I2781 Cor pulmonale (chronic): Secondary | ICD-10-CM

## 2015-01-09 DIAGNOSIS — D5700 Hb-SS disease with crisis, unspecified: Principal | ICD-10-CM

## 2015-01-09 LAB — LACTATE DEHYDROGENASE: LDH: 420 U/L — ABNORMAL HIGH (ref 98–192)

## 2015-01-09 LAB — CBC WITH DIFFERENTIAL/PLATELET
Basophils Absolute: 0.1 10*3/uL (ref 0.0–0.1)
Basophils Relative: 0 %
EOS PCT: 3 %
Eosinophils Absolute: 0.5 10*3/uL (ref 0.0–0.7)
HEMATOCRIT: 21.6 % — AB (ref 39.0–52.0)
Hemoglobin: 7.5 g/dL — ABNORMAL LOW (ref 13.0–17.0)
LYMPHS ABS: 2.5 10*3/uL (ref 0.7–4.0)
LYMPHS PCT: 14 %
MCH: 31.6 pg (ref 26.0–34.0)
MCHC: 34.7 g/dL (ref 30.0–36.0)
MCV: 91.1 fL (ref 78.0–100.0)
Monocytes Absolute: 2.1 10*3/uL — ABNORMAL HIGH (ref 0.1–1.0)
Monocytes Relative: 12 %
NEUTROS ABS: 13.1 10*3/uL — AB (ref 1.7–7.7)
Neutrophils Relative %: 71 %
PLATELETS: 435 10*3/uL — AB (ref 150–400)
RBC: 2.37 MIL/uL — AB (ref 4.22–5.81)
RDW: 20.8 % — ABNORMAL HIGH (ref 11.5–15.5)
WBC: 18.3 10*3/uL — AB (ref 4.0–10.5)

## 2015-01-09 LAB — COMPREHENSIVE METABOLIC PANEL
ALT: 25 U/L (ref 17–63)
AST: 33 U/L (ref 15–41)
Albumin: 4.2 g/dL (ref 3.5–5.0)
Alkaline Phosphatase: 80 U/L (ref 38–126)
Anion gap: 8 (ref 5–15)
BUN: 10 mg/dL (ref 6–20)
CHLORIDE: 106 mmol/L (ref 101–111)
CO2: 25 mmol/L (ref 22–32)
CREATININE: 0.64 mg/dL (ref 0.61–1.24)
Calcium: 9.1 mg/dL (ref 8.9–10.3)
GFR calc non Af Amer: >60 mL/min (ref >60)
Glucose, Bld: 116 mg/dL — ABNORMAL HIGH (ref 65–99)
Potassium: 3.6 mmol/L (ref 3.5–5.1)
SODIUM: 139 mmol/L (ref 135–145)
Total Bilirubin: 7.6 mg/dL — ABNORMAL HIGH (ref 0.3–1.2)
Total Protein: 7.4 g/dL (ref 6.5–8.1)

## 2015-01-09 MED ORDER — CETYLPYRIDINIUM CHLORIDE 0.05 % MT LIQD
7.0000 mL | Freq: Two times a day (BID) | OROMUCOSAL | Status: DC
  Administered 2015-01-09 – 2015-01-10 (×2): 7 mL via OROMUCOSAL

## 2015-01-09 MED ORDER — HYDROMORPHONE 1 MG/ML IV SOLN
INTRAVENOUS | Status: DC
  Administered 2015-01-09: 4.8 mg via INTRAVENOUS
  Administered 2015-01-09: 12.6 mg via INTRAVENOUS
  Administered 2015-01-09: 6.4 mg via INTRAVENOUS
  Administered 2015-01-09: 8.8 mg via INTRAVENOUS
  Administered 2015-01-10: 03:00:00 via INTRAVENOUS
  Administered 2015-01-10: 4.8 mg via INTRAVENOUS
  Filled 2015-01-09: qty 25

## 2015-01-09 MED ORDER — KETOROLAC TROMETHAMINE 30 MG/ML IJ SOLN
30.0000 mg | Freq: Four times a day (QID) | INTRAMUSCULAR | Status: DC
  Administered 2015-01-09 – 2015-01-10 (×4): 30 mg via INTRAVENOUS
  Filled 2015-01-09 (×4): qty 1

## 2015-01-09 MED ORDER — HYDROMORPHONE HCL 4 MG PO TABS
4.0000 mg | ORAL_TABLET | ORAL | Status: DC
  Administered 2015-01-09 – 2015-01-10 (×5): 4 mg via ORAL
  Filled 2015-01-09 (×5): qty 1

## 2015-01-09 MED ORDER — DIPHENHYDRAMINE HCL 25 MG PO CAPS: 25.0000 mg | ORAL_CAPSULE | Freq: Four times a day (QID) | ORAL | Status: DC | PRN

## 2015-01-09 MED ORDER — HYDROMORPHONE HCL 2 MG/ML IJ SOLN
2.0000 mg | INTRAMUSCULAR | Status: DC | PRN
  Administered 2015-01-09: 2 mg via INTRAVENOUS
  Filled 2015-01-09: qty 1

## 2015-01-09 NOTE — Progress Notes (Signed)
SATURATION QUALIFICATIONS: (This note is used to comply with regulatory documentation for home oxygen)  Patient Saturations on Room Air at Rest =93  Patient Saturations on Room Air while Ambulating = 84  Patient Saturations on2Liters of oxygen while Ambulating = 88  Please briefly explain why patient needs home oxygen:patient desaturates

## 2015-01-09 NOTE — Progress Notes (Addendum)
SICKLE CELL SERVICE PROGRESS NOTE  BRISTOL GINLEY [id number] DOB: 21-Apr-1980 DOA: 01/08/2015 PCP: [name], MD  Assessment/Plan: Principal Problem:   Sickle cell crisis ([place]) Active Problems:   Cor pulmonale, chronic (HCC)   PAH (pulmonary artery hypertension) ([place])   Sickle cell pain crisis ([place])   Pulmonary embolus ([place])  1. Hb SS with crisis: Pt reports that he feels like he can manage at home. However he has  Used a substantial amount of Dilaudid on the PCa an it is unlikely that he will be able to obtain the same control of pain on oral medications. I will schedule oral analgesics and continue PCA for PRN, also continue Toradol and re-evaluate for discharge in the morning. 2. Leukocytosis: Pt has no signs of infection. This is likely a reflection of crisis.  3. Anemia of chronic disease: Hb at baseline.  4. Chronic hypoxic respiratory failure: Currently at baseline.  5. Chronic Anticoagulation: for recurrent PE. Continue Lovenox.   Code Status: Full Code Family Communication: N/A Disposition Plan: Not yet ready for discharge  [place].  Pager [phone]. If 7PM-7AM, please contact night-coverage.  01/09/2015, 2:40 PM  LOS: 1 day   Interim History: Pt reports pain improved.  Consultants:  None  Procedures:  None  Antibiotics:  None   Objective: Filed Vitals:   01/09/15 1040 01/09/15 1205 01/09/15 1347 01/09/15 1427  BP: 121/79     Pulse: 82     Temp: 98.8 F (37.1 C)     TempSrc: Oral     Resp: 16 12    Height:      Weight:      SpO2: 96% 96% 84% 88%   Weight change:   Intake/Output Summary (Last 24 hours) at 01/09/15 1440 Last data filed at 01/09/15 1200  Gross per 24 hour  Intake 1666.5 ml  Output    500 ml  Net 1166.5 ml    General: Alert, awake, oriented x3, in no acute distress. Non-toxic appearing  HEENT: [place]/AT PEERL, EOMI, anicteric Neck: Trachea midline,  no masses, no thyromegal,y no JVD, no carotid  bruit OROPHARYNX:  Moist, No exudate/ erythema/lesions.  Heart: Regular rate and rhythm, without murmurs, rubs, gallops, PMI non-displaced, no heaves or thrills on palpation.  Lungs: Clear to auscultation, no wheezing or rhonchi noted. No increased vocal fremitus resonant to percussion  Abdomen: Soft, nontender, nondistended, positive bowel sounds, no masses no hepatosplenomegaly noted..  Neuro: No focal neurological deficits noted cranial nerves II through XII grossly intact.  Strength at baseline in bilateral upper and lower extremities. Musculoskeletal: No warm swelling or erythema around joints, no spinal tenderness noted. Psychiatric: Patient alert and oriented x3, good insight and cognition, good recent to remote recall.    Data Reviewed: Basic Metabolic Panel:  Recent Labs Lab 01/05/15 1000 01/08/15 1716 01/09/15 0604  NA 139 139 139  K 3.6 3.5 3.6  CL 108 107 106  CO2 25 23 25   GLUCOSE 98 106* 116*  BUN 8 9 10   CREATININE 0.61 0.59* 0.64  CALCIUM 8.8* 9.2 9.1   Liver Function Tests:  Recent Labs Lab 01/05/15 1000 01/08/15 1716 01/09/15 0604  AST 40 42* 33  ALT 22 30 25   ALKPHOS 89 88 80  BILITOT 6.4* 7.3* 7.6*  PROT 7.3 7.9 7.4  ALBUMIN 4.2 4.4 4.2   No results for input(s): LIPASE, AMYLASE in the last 168 hours. No results for input(s): AMMONIA in the last 168 hours. CBC:  Recent Labs Lab 01/05/15 1000 01/08/15  1716 01/09/15 0604  WBC 16.7* 20.1* 18.3*  NEUTROABS 11.1* 15.5* 13.1*  HGB 7.8* 8.1* 7.5*  HCT 23.1* 23.7* 21.6*  MCV 88.8 91.5 91.1  PLT 444* 474* 435*   Cardiac Enzymes: No results for input(s): CKTOTAL, CKMB, CKMBINDEX, TROPONINI in the last 168 hours. BNP (last 3 results)  Recent Labs  08/29/14 0810 09/10/14 0612 11/30/14 0910  BNP 1117.0* 549.5* 639.7*    ProBNP (last 3 results) No results for input(s): PROBNP in the last 8760 hours.  CBG: No results for input(s): GLUCAP in the last 168 hours.  No results found for this  or any previous visit (from the past 240 hour(s)).   Studies: Dg Chest 2 View  12/29/2014  CLINICAL DATA:  Sickle Cell Crisis; SOB;dyspnea with exertion; no chest pain; Former Smoker; Hx HTN; Pulmonary Embolus; Patient has Port A Cath EXAM: CHEST  2 VIEW COMPARISON:  12/16/2014 FINDINGS: Left-sided Port-A-Cath tip to the lower superior vena cava. The heart is mildly enlarged. There has been some improvement in aeration of the lungs bilaterally. Very minimal opacities persist primarily in the right lower lobe. No frank consolidations. No pleural effusions or pulmonary edema. IMPRESSION: 1. Stable cardiomegaly. 2. Improved aeration. Electronically Signed   By: Nolon Nations M.D.   On: 12/29/2014 15:00   Dg Chest Portable 1 View  01/08/2015  CLINICAL DATA:  Acute onset of bilateral chest and rib pain. Initial encounter. EXAM: PORTABLE CHEST 1 VIEW COMPARISON:  Chest radiograph performed 12/29/2014 FINDINGS: The lungs are well-aerated. Vascular congestion is noted. There is no evidence of focal opacification, pleural effusion or pneumothorax. The cardiomediastinal silhouette is mildly enlarged. A left-sided chest port is noted ending about the cavoatrial junction. No acute osseous abnormalities are seen. IMPRESSION: Vascular congestion and mild cardiomegaly. Lungs remain grossly clear. No displaced rib fracture seen. Electronically Signed   By: Garald Balding M.D.   On: 01/08/2015 19:18    Scheduled Meds: . antiseptic oral rinse  7 mL Mouth Rinse BID  . aspirin  81 mg Oral Daily  . cholecalciferol  2,000 Units Oral Daily  . enoxaparin  120 mg Subcutaneous Q24H  . folic acid  1 mg Oral q morning - 10a  . HYDROmorphone   Intravenous 6 times per day  . hydroxyurea  500 mg Oral Daily  . ketorolac  30 mg Intravenous 4 times per day  . lisinopril  10 mg Oral Daily  . metoprolol succinate  50 mg Oral Daily  . morphine  30 mg Oral Q12H  . potassium chloride SA  20 mEq Oral q morning - 10a  .  senna-docusate  1 tablet Oral BID   Continuous Infusions: . sodium chloride 50 mL/hr at 01/08/15 2258    Time spent 30 minutes.

## 2015-01-09 NOTE — Progress Notes (Signed)
Patient admitted to room 1335 in Sickle cell crisis. Pain in ribs bilaterally. Paged MD on call for admission orders.

## 2015-01-09 NOTE — Progress Notes (Signed)
Report obtained from [facility] in ED.

## 2015-01-10 MED ORDER — HEPARIN SOD (PORK) LOCK FLUSH 100 UNIT/ML IV SOLN
500.0000 [IU] | Freq: Once | INTRAVENOUS | Status: DC
Start: 2015-01-10 — End: 2015-01-10
  Filled 2015-01-10: qty 5

## 2015-01-10 NOTE — Progress Notes (Signed)
Patient went home against medical advice.patient refused to wait for Dr Jonelle Sidle to round and see him, Patient aware about risks.

## 2015-01-12 ENCOUNTER — Non-Acute Institutional Stay (HOSPITAL_BASED_OUTPATIENT_CLINIC_OR_DEPARTMENT_OTHER)
Admission: AD | Admit: 2015-01-12 | Discharge: 2015-01-12 | Disposition: A | Discharge status: Home / Self Care | Payer: Medicare Other | Source: Home / Self Care | Attending: Internal Medicine | Admitting: Internal Medicine

## 2015-01-12 ENCOUNTER — Encounter (HOSPITAL_COMMUNITY): Payer: Self-pay | Admitting: *Deleted

## 2015-01-12 ENCOUNTER — Telehealth (HOSPITAL_COMMUNITY): Payer: Self-pay | Admitting: *Deleted

## 2015-01-12 ENCOUNTER — Other Ambulatory Visit: Payer: Self-pay | Admitting: *Deleted

## 2015-01-12 DIAGNOSIS — Z9049 Acquired absence of other specified parts of digestive tract: Secondary | ICD-10-CM

## 2015-01-12 DIAGNOSIS — D57819 Other sickle-cell disorders with crisis, unspecified: Secondary | ICD-10-CM

## 2015-01-12 DIAGNOSIS — D5700 Hb-SS disease with crisis, unspecified: Secondary | ICD-10-CM | POA: Insufficient documentation

## 2015-01-12 DIAGNOSIS — Z7901 Long term (current) use of anticoagulants: Secondary | ICD-10-CM

## 2015-01-12 DIAGNOSIS — D72829 Elevated white blood cell count, unspecified: Secondary | ICD-10-CM | POA: Diagnosis present

## 2015-01-12 DIAGNOSIS — Z7982 Long term (current) use of aspirin: Secondary | ICD-10-CM

## 2015-01-12 DIAGNOSIS — R0781 Pleurodynia: Secondary | ICD-10-CM | POA: Diagnosis not present

## 2015-01-12 DIAGNOSIS — Z87891 Personal history of nicotine dependence: Secondary | ICD-10-CM | POA: Insufficient documentation

## 2015-01-12 DIAGNOSIS — Z9981 Dependence on supplemental oxygen: Secondary | ICD-10-CM | POA: Diagnosis not present

## 2015-01-12 DIAGNOSIS — J9611 Chronic respiratory failure with hypoxia: Secondary | ICD-10-CM | POA: Diagnosis not present

## 2015-01-12 DIAGNOSIS — G4700 Insomnia, unspecified: Secondary | ICD-10-CM

## 2015-01-12 DIAGNOSIS — I2781 Cor pulmonale (chronic): Secondary | ICD-10-CM | POA: Diagnosis not present

## 2015-01-12 DIAGNOSIS — D571 Sickle-cell disease without crisis: Secondary | ICD-10-CM | POA: Diagnosis not present

## 2015-01-12 DIAGNOSIS — I10 Essential (primary) hypertension: Secondary | ICD-10-CM | POA: Insufficient documentation

## 2015-01-12 DIAGNOSIS — Z96641 Presence of right artificial hip joint: Secondary | ICD-10-CM | POA: Insufficient documentation

## 2015-01-12 DIAGNOSIS — Z79899 Other long term (current) drug therapy: Secondary | ICD-10-CM

## 2015-01-12 DIAGNOSIS — Z86718 Personal history of other venous thrombosis and embolism: Secondary | ICD-10-CM

## 2015-01-12 DIAGNOSIS — Z86711 Personal history of pulmonary embolism: Secondary | ICD-10-CM | POA: Insufficient documentation

## 2015-01-12 DIAGNOSIS — Z79891 Long term (current) use of opiate analgesic: Secondary | ICD-10-CM

## 2015-01-12 DIAGNOSIS — Z7722 Contact with and (suspected) exposure to environmental tobacco smoke (acute) (chronic): Secondary | ICD-10-CM | POA: Insufficient documentation

## 2015-01-12 LAB — RETICULOCYTES
RBC.: 2.06 MIL/uL — AB (ref 4.22–5.81)
RETIC COUNT ABSOLUTE: 241 10*3/uL — AB (ref 19.0–186.0)
RETIC CT PCT: 11.7 % — AB (ref 0.4–3.1)

## 2015-01-12 LAB — CBC WITH DIFFERENTIAL/PLATELET
BASOS ABS: 0 10*3/uL (ref 0.0–0.1)
Basophils Relative: 0 %
EOS ABS: 0.2 10*3/uL (ref 0.0–0.7)
Eosinophils Relative: 1 %
HCT: 18 % — ABNORMAL LOW (ref 39.0–52.0)
Hemoglobin: 6.4 g/dL — CL (ref 13.0–17.0)
Lymphocytes Relative: 12 %
Lymphs Abs: 2 10*3/uL (ref 0.7–4.0)
MCH: 31.1 pg (ref 26.0–34.0)
MCHC: 35.6 g/dL (ref 30.0–36.0)
MCV: 87.4 fL (ref 78.0–100.0)
MONO ABS: 2.6 10*3/uL — AB (ref 0.1–1.0)
Monocytes Relative: 16 %
NEUTROS PCT: 71 %
Neutro Abs: 11.6 10*3/uL — ABNORMAL HIGH (ref 1.7–7.7)
Platelets: 417 10*3/uL — ABNORMAL HIGH (ref 150–400)
RBC: 2.06 MIL/uL — AB (ref 4.22–5.81)
RDW: 18.8 % — ABNORMAL HIGH (ref 11.5–15.5)
WBC: 16.4 10*3/uL — AB (ref 4.0–10.5)

## 2015-01-12 MED ORDER — SODIUM CHLORIDE 0.9 % IJ SOLN: 9.0000 mL | INTRAMUSCULAR | Status: DC | PRN

## 2015-01-12 MED ORDER — ONDANSETRON HCL 4 MG/2ML IJ SOLN: 4.0000 mg | Freq: Four times a day (QID) | INTRAMUSCULAR | Status: DC | PRN

## 2015-01-12 MED ORDER — DIPHENHYDRAMINE HCL 12.5 MG/5ML PO ELIX: 12.5000 mg | ORAL_SOLUTION | Freq: Four times a day (QID) | ORAL | Status: DC | PRN

## 2015-01-12 MED ORDER — SODIUM CHLORIDE 0.9 % IJ SOLN
10.0000 mL | INTRAMUSCULAR | Status: AC | PRN
  Administered 2015-01-12: 10 mL

## 2015-01-12 MED ORDER — NALOXONE HCL 0.4 MG/ML IJ SOLN: 0.4000 mg | INTRAMUSCULAR | Status: DC | PRN

## 2015-01-12 MED ORDER — HEPARIN SOD (PORK) LOCK FLUSH 100 UNIT/ML IV SOLN
500.0000 [IU] | INTRAVENOUS | Status: AC | PRN
  Administered 2015-01-12: 500 [IU]
  Filled 2015-01-12: qty 5

## 2015-01-12 MED ORDER — DEXTROSE-NACL 5-0.45 % IV SOLN
INTRAVENOUS | Status: DC
  Administered 2015-01-12: 09:00:00 via INTRAVENOUS

## 2015-01-12 MED ORDER — HYDROMORPHONE 1 MG/ML IV SOLN
INTRAVENOUS | Status: DC
Start: 2015-01-12 — End: 2015-01-12
  Administered 2015-01-12: 10:00:00 via INTRAVENOUS
  Administered 2015-01-12: 14 mg via INTRAVENOUS
  Filled 2015-01-12: qty 25

## 2015-01-12 MED ORDER — HYDROMORPHONE HCL 4 MG PO TABS
4.0000 mg | ORAL_TABLET | Freq: Once | ORAL | Status: AC
  Administered 2015-01-12: 4 mg via ORAL
  Filled 2015-01-12: qty 1

## 2015-01-12 MED ORDER — KETOROLAC TROMETHAMINE 30 MG/ML IJ SOLN
30.0000 mg | Freq: Once | INTRAMUSCULAR | Status: AC
  Administered 2015-01-12: 30 mg via INTRAVENOUS
  Filled 2015-01-12: qty 1

## 2015-01-12 MED ORDER — SODIUM CHLORIDE 0.9 % IV SOLN
12.5000 mg | Freq: Four times a day (QID) | INTRAVENOUS | Status: DC | PRN
  Filled 2015-01-12: qty 0.25

## 2015-01-12 NOTE — Progress Notes (Signed)
[facility]. Pt treated with IV fluids, Toradol and Dilaudid PCA. Stated pain was 9 on admission but has come down to 4 at discharge. Discharge instructions given to patient with verbal understanding. Pt reminded to drink more water and to make appt for office visit. Voiced understanding and discharge to home.

## 2015-01-12 NOTE — Progress Notes (Signed)
CRITICAL VALUE ALERT  Critical value received:  Hgb 6.4 Date of notification:  01/21/15 Time of notification:  [id number] Critical value read back:yes  Nurse who received alert:  Roberto Scales ,BSN  [place] Hollis, NP notified @ [phone]

## 2015-01-12 NOTE — Discharge Summary (Signed)
[facility] Discharge Summary   Patient ID: LINFORD SWEET MRN: [id number] DOB/AGE: 10-24-1980 34 y.o.  Admit date: 01/12/2015 Discharge date: 01/12/2015  Primary Care Physician:  [name], MD  Admission Diagnoses:  Active Problems:   Sickle-cell disease with pain ([place])   Discharge Medications:    Medication List    TAKE these medications        aspirin 81 MG chewable tablet  Chew 1 tablet (81 mg total) by mouth daily.     enoxaparin 120 MG/0.8ML injection  Commonly known as:  LOVENOX  Inject 0.8 mLs (120 mg total) into the skin daily.     folic acid 1 MG tablet  Commonly known as:  FOLVITE  Take 1 tablet (1 mg total) by mouth every morning.     HYDROmorphone 4 MG tablet  Commonly known as:  DILAUDID  Take 1 tablet (4 mg total) by mouth every 4 (four) hours as needed for severe pain.     hydroxyurea 500 MG capsule  Commonly known as:  HYDREA  Take 1 capsule (500 mg total) by mouth daily. May take with food to minimize GI side effects.     lisinopril 10 MG tablet  Commonly known as:  PRINIVIL,ZESTRIL  Take 1 tablet (10 mg total) by mouth daily.     metoprolol succinate 50 MG 24 hr tablet  Commonly known as:  TOPROL-XL  Take 1 tablet (50 mg total) by mouth daily.     morphine 30 MG 12 hr tablet  Commonly known as:  MS CONTIN  Take 1 tablet (30 mg total) by mouth every 12 (twelve) hours.     potassium chloride SA 20 MEQ tablet  Commonly known as:  K-DUR,KLOR-CON  Take 1 tablet (20 mEq total) by mouth every morning.     Vitamin D 2000 UNITS tablet  Take 1 tablet (2,000 Units total) by mouth daily.     zolpidem 10 MG tablet  Commonly known as:  AMBIEN  Take 1 tablet (10 mg total) by mouth at bedtime as needed for sleep.         Consults:  None  Significant Diagnostic Studies:  Dg Chest 2 View  12/29/2014  CLINICAL DATA:  Sickle Cell Crisis; SOB;dyspnea with exertion; no chest pain; Former Smoker; Hx HTN; Pulmonary  Embolus; Patient has Port A Cath EXAM: CHEST  2 VIEW COMPARISON:  12/16/2014 FINDINGS: Left-sided Port-A-Cath tip to the lower superior vena cava. The heart is mildly enlarged. There has been some improvement in aeration of the lungs bilaterally. Very minimal opacities persist primarily in the right lower lobe. No frank consolidations. No pleural effusions or pulmonary edema. IMPRESSION: 1. Stable cardiomegaly. 2. Improved aeration. Electronically Signed   By: Nolon Nations M.D.   On: 12/29/2014 15:00   Dg Chest Portable 1 View  01/08/2015  CLINICAL DATA:  Acute onset of bilateral chest and rib pain. Initial encounter. EXAM: PORTABLE CHEST 1 VIEW COMPARISON:  Chest radiograph performed 12/29/2014 FINDINGS: The lungs are well-aerated. Vascular congestion is noted. There is no evidence of focal opacification, pleural effusion or pneumothorax. The cardiomediastinal silhouette is mildly enlarged. A left-sided chest port is noted ending about the cavoatrial junction. No acute osseous abnormalities are seen. IMPRESSION: Vascular congestion and mild cardiomegaly. Lungs remain grossly clear. No displaced rib fracture seen. Electronically Signed   By: Garald Balding M.D.   On: 01/08/2015 19:18    [facility] Course: -Mr. Blane was admitted to the  day infusion center for extended observation.  -Started D5.45 for cellular re-hydration -Reviewed laboratory values, consistent with baseline -High Concentration PCA per weight based protocol for pain control. Patient used a total of 14 mg with 20 demands and 20 deliveries.  Patient was given Dilaudid 4 mg per home medication regimen. He states that pain intensity is 4/10, will discharge home in stable condition on 2 liters of oxygen per previous home oxygen orders.   He maintains that he can function at home on current medication regimen. Patient is alert, oriented and ambulatory. He is encouraged to continue hydrating and consistent on medication  regimen.    Physical Exam at Discharge:   BP 101/56 mmHg  Pulse 80  Temp(Src) 98.9 F (37.2 C)  Resp 18  Ht 6' (1.829 m)  Wt 165 lb (74.844 kg)  BMI 22.37 kg/m2  SpO2 95%   General Appearance:    Alert, cooperative, no distress, appears stated age  Eyes:    PERRL, conjunctiva/corneas clear, EOM's intact, fundi    benign, icterus bilaterally       Back:     Symmetric, no curvature, ROM normal, no CVA tenderness  Lungs:     Clear to auscultation bilaterally, respirations unlabored  Chest wall:    No tenderness or deformity  Heart:    Irregular rate and rhythm, S1 and S2 normal, no murmur, rub   or gallop  Lymph nodes:   Cervical, supraclavicular, and axillary nodes normal  Neurologic:   CNII-XII intact. Normal strength, sensation and reflexes      throughout     Disposition at Discharge: 07-Left Against Medical Advice  Discharge Orders: Discharge Instructions    Discharge patient    Complete by:  As directed            Condition at Discharge:   Stable  Time spent on Discharge:  15 minutes  Signed: Kismet Facemire M 01/12/2015, 3:08 PM

## 2015-01-12 NOTE — H&P (Signed)
[facility] History and Physical   Date: 01/12/2015  Patient name: [name] Medical record number: [id number] Date of birth: 1980/10/03 Age: 34 y.o. Gender: male PCP: [name], MD  Attending physician: Tresa Garter, MD  Chief Complaint: Bilateral Rib Pain  History of Present Illness: [name] is a 34 year old male with a history of sickle cell anemia,HbSS who presents to the day infusion center for pain related to sickle cell anemia. Patient was recently hospitalized on 01/05/2015. He states that pain intensity started increasing over the weekend. He maintains that he worked through the pain over the weekend. Current pain intensity is described as 8/10, constant and throbbing to bilateral ribs. He states that he has not been wearing home oxygent consistently. He las had Dilaudid 4 mg this am with minimal relief.  He denies any fever, chills, chest pain, shortness of breath, cough, abdominal pain, nausea, vomiting, or diarrhea.  Meds: Prescriptions prior to admission  Medication Sig Dispense Refill Last Dose  . aspirin 81 MG chewable tablet Chew 1 tablet (81 mg total) by mouth daily. 30 tablet 11 01/11/2015 at Unknown time  . Cholecalciferol (VITAMIN D) 2000 UNITS tablet Take 1 tablet (2,000 Units total) by mouth daily. 90 tablet 3 01/11/2015 at Unknown time  . enoxaparin (LOVENOX) 120 MG/0.8ML injection Inject 0.8 mLs (120 mg total) into the skin daily. 30 Syringe 2 01/11/2015 at Unknown time  . folic acid (FOLVITE) 1 MG tablet Take 1 tablet (1 mg total) by mouth every morning. 30 tablet 11 01/11/2015 at Unknown time  . HYDROmorphone (DILAUDID) 4 MG tablet Take 1 tablet (4 mg total) by mouth every 4 (four) hours as needed for severe pain. 90 tablet 0 01/12/2015 at Unknown time  . hydroxyurea (HYDREA) 500 MG capsule Take 1 capsule (500 mg total) by mouth daily. May take with food to minimize GI side effects. 30 capsule 0 01/11/2015 at Unknown  time  . lisinopril (PRINIVIL,ZESTRIL) 10 MG tablet Take 1 tablet (10 mg total) by mouth daily. 30 tablet 1 01/11/2015 at Unknown time  . metoprolol succinate (TOPROL-XL) 50 MG 24 hr tablet Take 1 tablet (50 mg total) by mouth daily. 30 tablet 1 01/11/2015 at Unknown time  . morphine (MS CONTIN) 30 MG 12 hr tablet Take 1 tablet (30 mg total) by mouth every 12 (twelve) hours. 60 tablet 0 01/11/2015 at Unknown time  . potassium chloride SA (K-DUR,KLOR-CON) 20 MEQ tablet Take 1 tablet (20 mEq total) by mouth every morning. 30 tablet 3 01/11/2015 at Unknown time  . zolpidem (AMBIEN) 10 MG tablet Take 1 tablet (10 mg total) by mouth at bedtime as needed for sleep. 30 tablet 0 Past Week at Unknown time    Allergies: Review of patient's allergies indicates no known allergies. Past Medical History  Diagnosis Date  . Sickle cell anemia (HCC)   . Blood transfusion   . Acute embolism and thrombosis of right internal jugular vein ([place])   . Hypokalemia   . Mood disorder ([place])   . History of pulmonary embolus (PE)   . Avascular necrosis (HCC)     Right Hip  . Leukocytosis     Chronic  . Thrombocytosis (HCC)     Chronic  . Hypertension   . History of Clostridium difficile infection   . Uses marijuana   . Chronic anticoagulation   . Functional asplenia   . Former smoker   . Second hand tobacco smoke exposure   .  Alcohol consumption of one to four drinks per day   . Noncompliance with medication regimen   . Sickle-cell crisis with associated acute chest syndrome ([place]) 05/13/2013  . Acute chest syndrome ([place]) 06/18/2013  . Demand ischemia ([place]) 01/02/2014  . Pulmonary hypertension [facility])    Past Surgical History  Procedure Laterality Date  . Right hip replacement      08/2006  . Cholecystectomy      01/2008  . Porta cath placement    . Porta cath removal    . Umbilical hernia repair      01/2008  . Excision of left periauricular cyst      10/2009  . Excision of right ear lobe cyst with  primary closur      11/2007  . Portacath placement  01/05/2012    Procedure: INSERTION PORT-A-CATH;  Surgeon: Odis Hollingshead, MD;  Location: [place];  Service: General;  Laterality: N/A;  ultrasound guiced port a cath insertion with fluoroscopy   Family History  Problem Relation Age of Onset  . Sickle cell trait Mother   . Depression Mother   . Diabetes Mother   . Sickle cell trait Father   . Sickle cell trait Brother    Social History   Social History  . Marital Status: Single    Spouse Name: N/A  . Number of Children: 0  . Years of Education: 13   Occupational History  . 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 Smoking status: Former Smoker -- 13 years  . Smokeless tobacco: Never Used  . Alcohol Use: No  . Drug Use: 2.00 per week    Special: Marijuana     Comment: Marijuana weekly  . Sexual Activity:    Partners: Female    Museum/gallery curator: None     Comment: month ago   Other Topics Concern  . Not on file   Social History Narrative   Lives in an apartment.  Single.  Lives alone but has a girlfriend that helps care for him.  Does not use any assist devices.        [name]  [phone] Mom, emergency contact      [facility]:   Patient continuing to live in her apartment in town alone. 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    Review of Systems: Constitutional: negative except for fatigue Eyes: positive for icterus Ears, nose, mouth, throat, and face: negative Respiratory: positive for dyspnea on exertion Cardiovascular: negative Gastrointestinal: negative Genitourinary:negative Musculoskeletal:positive for arthralgias and back pain Neurological: negative Behavioral/Psych: negative Endocrine: negative Allergic/Immunologic: negative  Physical Exam: Blood pressure 116/69, pulse 87, temperature 98.9 F (37.2 C), resp. rate 20, height 6' (1.829 m), weight 165 lb (74.844 kg), SpO2  94 %. BP 116/69 mmHg  Pulse 87  Temp(Src) 98.9 F (37.2 C)  Resp 20  Ht 6' (1.829 m)  Wt 165 lb (74.844 kg)  BMI 22.37 kg/m2  SpO2 94%  General Appearance:    Alert, cooperative, mild distress, appears older than stated age  Head:    Normocephalic, without obvious abnormality, atraumatic  Eyes:    PERRL, conjunctiva/corneas clear, EOM's intact, scleral icterus bilaterally    Ears:    Normal TM's and external ear canals, both ears  Nose:   Nares normal, septum midline, mucosa normal, no drainage    or sinus tenderness  Throat:   Lips, mucosa, and tongue  normal; teeth and gums normal  Neck:   Supple, symmetrical, trachea midline, no adenopathy;       thyroid:  No enlargement/tenderness/nodules; no carotid   bruit or JVD  Back:     Symmetric, no curvature, ROM normal, no CVA tenderness  Lungs:     Clear to auscultation bilaterally, respirations unlabored.   Chest wall:    No tenderness or deformity. Left port a cath  Heart:    Irregular rate and regular rhythm  Abdomen:     Soft, non-tender, bowel sounds active all four quadrants,    no masses, no organomegaly  Extremities:   Extremities normal, atraumatic, Left anterior leg, trace edema with tenderness to palpation  Pulses:   2+ and symmetric all extremities  Skin:   Skin color, texture, turgor normal, no rashes or lesions  Lymph nodes:   Cervical, supraclavicular, and axillary nodes normal  Neurologic:   CNII-XII intact. Normal strength, sensation and reflexes      throughout    Lab results: No results found. However, due to the size of the patient record, not all encounters were searched. Please check Results Review for a complete set of results.  Imaging results:  No results found. Assessment & Plan:  Patient will be admitted to the day infusion center for extended observation  Start IV D5.45 for cellular rehydration at 100/hr  Start Toradol 30 mg IV times 1 for inflammation.  Start Dilaudid PCA High Concentration per  weight based protocol.   Patient will be re-evaluated for pain intensity in the context of function and relationship to baseline as care progresses.   Will check CMP, reticulocytes,  and CBC w/differential  Grabiel Schmutz M 01/12/2015, 9:38 AM

## 2015-01-12 NOTE — Telephone Encounter (Signed)
Called pt back and told him that he could come [facility] for [facility] after speaking with the provider. Pt voiced understanding and that he could be here in 20 mins.

## 2015-01-12 NOTE — Telephone Encounter (Signed)
Pt called requesting to come to [facility] to be treated for pain. Pt states pain is in his sides, arms and legs. Denies chest pain, sob, priaprism, abd pain, and diarrhea. Pt states pain is 9/10. Will check with the provider C. Smith Robert, NP and call him back. Pt voiced understanding.

## 2015-01-13 MED ORDER — ZOLPIDEM TARTRATE 10 MG PO TABS: 10.0000 mg | ORAL_TABLET | Freq: Every evening | ORAL | Status: DC | PRN

## 2015-01-14 ENCOUNTER — Emergency Department (HOSPITAL_COMMUNITY): Payer: Medicare Other

## 2015-01-14 ENCOUNTER — Encounter (HOSPITAL_COMMUNITY): Payer: Self-pay | Admitting: Emergency Medicine

## 2015-01-14 ENCOUNTER — Inpatient Hospital Stay (HOSPITAL_COMMUNITY)
Admission: EM | Admit: 2015-01-14 | Discharge: 2015-01-20 | DRG: 812 | Disposition: A | Discharge status: Home / Self Care | Payer: Medicare Other | Attending: Internal Medicine | Admitting: Internal Medicine

## 2015-01-14 DIAGNOSIS — D5700 Hb-SS disease with crisis, unspecified: Secondary | ICD-10-CM | POA: Diagnosis not present

## 2015-01-14 DIAGNOSIS — Z7901 Long term (current) use of anticoagulants: Secondary | ICD-10-CM

## 2015-01-14 DIAGNOSIS — I2781 Cor pulmonale (chronic): Secondary | ICD-10-CM | POA: Diagnosis present

## 2015-01-14 DIAGNOSIS — I10 Essential (primary) hypertension: Secondary | ICD-10-CM | POA: Diagnosis present

## 2015-01-14 DIAGNOSIS — R0781 Pleurodynia: Secondary | ICD-10-CM | POA: Diagnosis not present

## 2015-01-14 DIAGNOSIS — D571 Sickle-cell disease without crisis: Secondary | ICD-10-CM | POA: Diagnosis not present

## 2015-01-14 DIAGNOSIS — J9611 Chronic respiratory failure with hypoxia: Secondary | ICD-10-CM | POA: Diagnosis present

## 2015-01-14 LAB — COMPREHENSIVE METABOLIC PANEL
ALT: 27 U/L (ref 17–63)
AST: 42 U/L — ABNORMAL HIGH (ref 15–41)
Albumin: 4.2 g/dL (ref 3.5–5.0)
Alkaline Phosphatase: 95 U/L (ref 38–126)
Anion gap: 8 (ref 5–15)
BILIRUBIN TOTAL: 5.8 mg/dL — AB (ref 0.3–1.2)
BUN: 13 mg/dL (ref 6–20)
CHLORIDE: 107 mmol/L (ref 101–111)
CO2: 23 mmol/L (ref 22–32)
CREATININE: 1.16 mg/dL (ref 0.61–1.24)
Calcium: 9 mg/dL (ref 8.9–10.3)
Glucose, Bld: 117 mg/dL — ABNORMAL HIGH (ref 65–99)
POTASSIUM: 3.7 mmol/L (ref 3.5–5.1)
Sodium: 138 mmol/L (ref 135–145)
TOTAL PROTEIN: 7.6 g/dL (ref 6.5–8.1)

## 2015-01-14 LAB — CBC WITH DIFFERENTIAL/PLATELET
BASOS ABS: 0.2 10*3/uL — AB (ref 0.0–0.1)
Basophils Relative: 1 %
EOS ABS: 0.4 10*3/uL (ref 0.0–0.7)
EOS PCT: 2 %
HEMATOCRIT: 20 % — AB (ref 39.0–52.0)
Hemoglobin: 6.9 g/dL — CL (ref 13.0–17.0)
LYMPHS ABS: 4.8 10*3/uL — AB (ref 0.7–4.0)
Lymphocytes Relative: 22 %
MCH: 31.4 pg (ref 26.0–34.0)
MCHC: 34.5 g/dL (ref 30.0–36.0)
MCV: 90.9 fL (ref 78.0–100.0)
MONO ABS: 2.4 10*3/uL — AB (ref 0.1–1.0)
Monocytes Relative: 11 %
NEUTROS PCT: 64 %
Neutro Abs: 14.2 10*3/uL — ABNORMAL HIGH (ref 1.7–7.7)
PLATELETS: 544 10*3/uL — AB (ref 150–400)
RBC: 2.2 MIL/uL — AB (ref 4.22–5.81)
RDW: 20.8 % — AB (ref 11.5–15.5)
WBC: 22 10*3/uL — AB (ref 4.0–10.5)

## 2015-01-14 LAB — RETICULOCYTES
RBC.: 2.19 MIL/uL — AB (ref 4.22–5.81)
RETIC COUNT ABSOLUTE: 317.6 10*3/uL — AB (ref 19.0–186.0)
RETIC CT PCT: 14.5 % — AB (ref 0.4–3.1)

## 2015-01-14 MED ORDER — SODIUM CHLORIDE 0.9 % IV SOLN
1000.0000 mL | Freq: Once | INTRAVENOUS | Status: AC
  Administered 2015-01-14: 1000 mL via INTRAVENOUS

## 2015-01-14 MED ORDER — HYDROMORPHONE HCL 2 MG/ML IJ SOLN
2.0000 mg | INTRAMUSCULAR | Status: DC | PRN
  Administered 2015-01-14 – 2015-01-15 (×3): 2 mg via INTRAVENOUS
  Filled 2015-01-14 (×3): qty 1

## 2015-01-14 MED ORDER — SODIUM CHLORIDE 0.9 % IV SOLN
1000.0000 mL | INTRAVENOUS | Status: DC
  Administered 2015-01-15: 1000 mL via INTRAVENOUS

## 2015-01-14 NOTE — ED Notes (Signed)
Pt complaining of sickle cell pain in his ribs and lower back. Denies N/V/D, chest pain, SOB, fever/chills. Says it's been going on intermittently for the last week.

## 2015-01-14 NOTE — ED Provider Notes (Signed)
CSN: [id number]     Arrival date & time 01/14/15  2113 History   First MD Initiated Contact with Patient 01/14/15 2137     Chief Complaint  Patient presents with  . Sickle Cell Pain Crisis     (Consider location/radiation/quality/duration/timing/severity/associated sxs/prior Treatment) HPI   34 year old male with history of sickle cell anemia presenting with complaints of ribs and back pain that he felt is related to his sickle cell crisis. Describe pain as sharp and achy sensation ongoing for the last several days, persistent, worsening with weather changes. He denies having any URI symptoms, fever, chills, productive cough, shortness of breath, abdominal pain, nausea vomiting diarrhea, or rash. He has been taking his home pain medication without adequate relief. Patient has been seen multiple times in the ED for same. His last blood transfusion was a month ago.  Past Medical History  Diagnosis Date  . Sickle cell anemia (HCC)   . Blood transfusion   . Acute embolism and thrombosis of right internal jugular vein ([place])   . Hypokalemia   . Mood disorder ([place])   . History of pulmonary embolus ([facility])   . Avascular necrosis (HCC)     Right Hip  . Leukocytosis     Chronic  . Thrombocytosis (HCC)     Chronic  . Hypertension   . History of Clostridium difficile infection   . Uses marijuana   . Chronic anticoagulation   . Functional asplenia   . Former smoker   . Second hand tobacco smoke exposure   . Alcohol consumption of one to four drinks per day   . Noncompliance with medication regimen   . Sickle-cell crisis with associated acute chest syndrome ([place]) 05/13/2013  . Acute chest syndrome ([place]) 06/18/2013  . Demand ischemia ([place]) 01/02/2014  . Pulmonary hypertension [facility])    Past Surgical History  Procedure Laterality Date  . Right hip replacement      08/2006  . Cholecystectomy      01/2008  . Porta cath placement    . Porta cath removal    . Umbilical hernia repair     01/2008  . Excision of left periauricular cyst      10/2009  . Excision of right ear lobe cyst with primary closur      11/2007  . Portacath placement  01/05/2012    Procedure: INSERTION PORT-A-CATH;  Surgeon: Odis Hollingshead, MD;  Location: [place];  Service: General;  Laterality: N/A;  ultrasound guiced port a cath insertion with fluoroscopy   Family History  Problem Relation Age of Onset  . Sickle cell trait Mother   . Depression Mother   . Diabetes Mother   . Sickle cell trait Father   . Sickle cell trait Brother    Social History  Substance Use Topics  . Smoking status: Former Smoker -- 13 years  . Smokeless tobacco: Never Used  . Alcohol Use: No    Review of Systems  All other systems reviewed and are negative.     Allergies  Review of patient's allergies indicates no known allergies.  Home Medications   Prior to Admission medications   Medication Sig Start Date End Date Taking? Authorizing Provider  aspirin 81 MG chewable tablet Chew 1 tablet (81 mg total) by mouth daily. 07/23/14  Yes [name], MD  Cholecalciferol (VITAMIN D) 2000 UNITS tablet Take 1 tablet (2,000 Units total) by mouth daily. 02/13/14  Yes [name], MD  enoxaparin (LOVENOX) 120 MG/0.8ML injection  Inject 0.8 mLs (120 mg total) into the skin daily. 12/03/14  Yes [name], MD  folic acid (FOLVITE) 1 MG tablet Take 1 tablet (1 mg total) by mouth every morning. 10/08/13  Yes [name], MD  HYDROmorphone (DILAUDID) 4 MG tablet Take 1 tablet (4 mg total) by mouth every 4 (four) hours as needed for severe pain. 12/23/14  Yes Dorena Dew, FNP  hydroxyurea (HYDREA) 500 MG capsule Take 1 capsule (500 mg total) by mouth daily. May take with food to minimize GI side effects. 11/06/14  Yes Dorena Dew, FNP  lisinopril (PRINIVIL,ZESTRIL) 10 MG tablet Take 1 tablet (10 mg total) by mouth daily. 01/22/14  Yes [name], MD  metoprolol succinate (TOPROL-XL) 50 MG  24 hr tablet Take 1 tablet (50 mg total) by mouth daily. 11/28/14  Yes [name], MD  morphine (MS CONTIN) 30 MG 12 hr tablet Take 1 tablet (30 mg total) by mouth every 12 (twelve) hours. 12/23/14  Yes Dorena Dew, FNP  potassium chloride SA (K-DUR,KLOR-CON) 20 MEQ tablet Take 1 tablet (20 mEq total) by mouth every morning. 06/09/14  Yes [name], MD  zolpidem (AMBIEN) 10 MG tablet Take 1 tablet (10 mg total) by mouth at bedtime as needed for sleep. 01/13/15  Yes Dorena Dew, FNP   BP 97/64 mmHg  Pulse 98  Temp(Src) 98.4 F (36.9 C) (Oral)  Resp 16  SpO2 91% Physical Exam  Constitutional: He is oriented to person, place, and time. He appears well-developed and well-nourished. No distress.  HENT:  Head: Atraumatic.  Eyes: Conjunctivae are normal.  Neck: Neck supple.  Cardiovascular: Normal rate and regular rhythm.   Pulmonary/Chest: Effort normal and breath sounds normal.  Abdominal: Soft. There is no tenderness.  Musculoskeletal: He exhibits no edema.  Neurological: He is alert and oriented to person, place, and time.  Skin: No rash noted.  Psychiatric: He has a normal mood and affect.  Nursing note and vitals reviewed.   ED Course  Procedures (including critical care time) Labs Review Labs Reviewed  COMPREHENSIVE METABOLIC PANEL - Abnormal; Notable for the following:    Glucose, Bld 117 (*)    AST 42 (*)    Total Bilirubin 5.8 (*)    All other components within normal limits  CBC WITH DIFFERENTIAL/PLATELET - Abnormal; Notable for the following:    WBC 22.0 (*)    RBC 2.20 (*)    Hemoglobin 6.9 (*)    HCT 20.0 (*)    RDW 20.8 (*)    Platelets 544 (*)    Neutro Abs 14.2 (*)    Lymphs Abs 4.8 (*)    Monocytes Absolute 2.4 (*)    Basophils Absolute 0.2 (*)    All other components within normal limits  RETICULOCYTES - Abnormal; Notable for the following:    Retic Ct Pct 14.5 (*)    RBC. 2.19 (*)    Retic Count, Manual 317.6 (*)    All other  components within normal limits    Imaging Review Dg Chest Portable 1 View  01/14/2015  CLINICAL DATA:  Sickle cell pain in the ribs and low back. Intermittently for the last week. EXAM: PORTABLE CHEST 1 VIEW COMPARISON:  01/08/2015 FINDINGS: Cardiac enlargement with mild pulmonary vascular congestion similar to prior study. Mild interstitial fibrosis. No evidence of edema or consolidation. No blunting of costophrenic angles. No pneumothorax. Mediastinal contours appear intact. Power port type central venous catheter with tip over  the cavoatrial junction region. The sclerosis in the proximal left humerus consistent with bone infarct. IMPRESSION: Cardiac enlargement with mild pulmonary vascular congestion. No focal consolidation. Electronically Signed   By: Lucienne Capers M.D.   On: 01/14/2015 22:11   I have personally reviewed and evaluated these images and lab results as part of my medical decision-making.   EKG Interpretation None      MDM   Final diagnoses:  Sickle cell anemia with pain (HCC)    BP 104/73 mmHg  Pulse 70  Temp(Src) 98.4 F (36.9 C) (Oral)  Resp 19  SpO2 98%   9:52 PM Patient with history of sickle cell crisis here with wrist pain and back pain. Pain is not reproducible on exam. Is afebrile and her exam is unremarkable. No suspicion for daily at bedtime however chest x-ray ordered. Will treat his symptoms and will determine if patient needs admission for further management.  10:54 PM Elevated WBC of 22.0, but CXR without signs of pna.  Doubt acute chest syndrome.  Pt's Hgb is 6.9, improves to 6.7 from 2 days ago. Pt has received a total of 6mg  of Dilaudid and states pain is 7/10 and requesting for admission.  Appreciate consultation from [facility] Dr. Alcario Drought who agrees to see pt in ER and will admit for further care.      Domenic Moras, PA-C 01/15/15 0015  Charlesetta Shanks, MD 01/25/15 [phone]

## 2015-01-14 NOTE — ED Notes (Signed)
Nurse will access port

## 2015-01-15 DIAGNOSIS — D72829 Elevated white blood cell count, unspecified: Secondary | ICD-10-CM | POA: Diagnosis present

## 2015-01-15 DIAGNOSIS — G894 Chronic pain syndrome: Secondary | ICD-10-CM | POA: Diagnosis not present

## 2015-01-15 DIAGNOSIS — D5700 Hb-SS disease with crisis, unspecified: Secondary | ICD-10-CM | POA: Diagnosis not present

## 2015-01-15 DIAGNOSIS — Z79899 Other long term (current) drug therapy: Secondary | ICD-10-CM | POA: Diagnosis not present

## 2015-01-15 DIAGNOSIS — Z96641 Presence of right artificial hip joint: Secondary | ICD-10-CM | POA: Diagnosis present

## 2015-01-15 DIAGNOSIS — Z87891 Personal history of nicotine dependence: Secondary | ICD-10-CM | POA: Diagnosis not present

## 2015-01-15 DIAGNOSIS — Z9981 Dependence on supplemental oxygen: Secondary | ICD-10-CM | POA: Diagnosis not present

## 2015-01-15 DIAGNOSIS — D638 Anemia in other chronic diseases classified elsewhere: Secondary | ICD-10-CM | POA: Diagnosis not present

## 2015-01-15 DIAGNOSIS — Z7982 Long term (current) use of aspirin: Secondary | ICD-10-CM | POA: Diagnosis not present

## 2015-01-15 DIAGNOSIS — I2781 Cor pulmonale (chronic): Secondary | ICD-10-CM | POA: Diagnosis not present

## 2015-01-15 DIAGNOSIS — Z9049 Acquired absence of other specified parts of digestive tract: Secondary | ICD-10-CM | POA: Diagnosis not present

## 2015-01-15 DIAGNOSIS — Z7901 Long term (current) use of anticoagulants: Secondary | ICD-10-CM | POA: Diagnosis not present

## 2015-01-15 DIAGNOSIS — J9611 Chronic respiratory failure with hypoxia: Secondary | ICD-10-CM | POA: Diagnosis not present

## 2015-01-15 MED ORDER — HYDROMORPHONE 1 MG/ML IV SOLN
INTRAVENOUS | Status: DC
  Administered 2015-01-15: 7.8 mg via INTRAVENOUS
  Administered 2015-01-15: 17:00:00 via INTRAVENOUS
  Administered 2015-01-15: 1.4 mg via INTRAVENOUS
  Administered 2015-01-16: 3 mg via INTRAVENOUS
  Administered 2015-01-16: 4.4 mg via INTRAVENOUS
  Administered 2015-01-16: 10:00:00 via INTRAVENOUS
  Administered 2015-01-16: 3.6 mg via INTRAVENOUS
  Administered 2015-01-16: 4.8 mg via INTRAVENOUS
  Administered 2015-01-16: 13.4 mg via INTRAVENOUS
  Administered 2015-01-16: 7.4 mg via INTRAVENOUS
  Administered 2015-01-16: 4.8 mg via INTRAVENOUS
  Administered 2015-01-17: 5.2 mg via INTRAVENOUS
  Administered 2015-01-17: 6.6 mg via INTRAVENOUS
  Administered 2015-01-17: 01:00:00 via INTRAVENOUS
  Administered 2015-01-17: 2.99 mg via INTRAVENOUS
  Administered 2015-01-17: 20:00:00 via INTRAVENOUS
  Administered 2015-01-17: 8.79 mg via INTRAVENOUS
  Administered 2015-01-17: 1.2 mg via INTRAVENOUS
  Administered 2015-01-18: 6.6 mg via INTRAVENOUS
  Administered 2015-01-18: 8.8 mg via INTRAVENOUS
  Administered 2015-01-18: 15.39 mg via INTRAVENOUS
  Administered 2015-01-18: 0.6 mg via INTRAVENOUS
  Administered 2015-01-18: 15:00:00 via INTRAVENOUS
  Administered 2015-01-18: 6.2 mg via INTRAVENOUS
  Administered 2015-01-18: 3 mg via INTRAVENOUS
  Administered 2015-01-19: 2.97 mg via INTRAVENOUS
  Administered 2015-01-19: 0.6 mg via INTRAVENOUS
  Administered 2015-01-19: 5.36 mg via INTRAVENOUS
  Administered 2015-01-19: 7.8 mg via INTRAVENOUS
  Administered 2015-01-19: 04:00:00 via INTRAVENOUS
  Administered 2015-01-19: 3 mg via INTRAVENOUS
  Administered 2015-01-19: 8.4 mg via INTRAVENOUS
  Administered 2015-01-20: 6.6 mg via INTRAVENOUS
  Administered 2015-01-20: 9.6 mg via INTRAVENOUS
  Administered 2015-01-20: 4.2 mg via INTRAVENOUS
  Administered 2015-01-20: 8.4 mg via INTRAVENOUS
  Filled 2015-01-15 (×7): qty 25

## 2015-01-15 MED ORDER — METOPROLOL SUCCINATE ER 50 MG PO TB24
50.0000 mg | ORAL_TABLET | Freq: Every day | ORAL | Status: DC
  Administered 2015-01-15 – 2015-01-20 (×6): 50 mg via ORAL
  Filled 2015-01-15 (×6): qty 1

## 2015-01-15 MED ORDER — NALOXONE HCL 0.4 MG/ML IJ SOLN: 0.4000 mg | INTRAMUSCULAR | Status: DC | PRN

## 2015-01-15 MED ORDER — DIPHENHYDRAMINE HCL 50 MG/ML IJ SOLN
12.5000 mg | Freq: Four times a day (QID) | INTRAMUSCULAR | Status: DC | PRN
  Administered 2015-01-15 – 2015-01-19 (×5): 12.5 mg via INTRAVENOUS
  Filled 2015-01-15 (×10): qty 0.25

## 2015-01-15 MED ORDER — LISINOPRIL 10 MG PO TABS
10.0000 mg | ORAL_TABLET | Freq: Every day | ORAL | Status: DC
  Administered 2015-01-15 – 2015-01-20 (×6): 10 mg via ORAL
  Filled 2015-01-15 (×6): qty 1

## 2015-01-15 MED ORDER — ASPIRIN 81 MG PO CHEW
81.0000 mg | CHEWABLE_TABLET | Freq: Every day | ORAL | Status: DC
  Administered 2015-01-15 – 2015-01-20 (×6): 81 mg via ORAL
  Filled 2015-01-15 (×6): qty 1

## 2015-01-15 MED ORDER — KETOROLAC TROMETHAMINE 30 MG/ML IJ SOLN
30.0000 mg | Freq: Four times a day (QID) | INTRAMUSCULAR | Status: DC
  Administered 2015-01-15 – 2015-01-20 (×18): 30 mg via INTRAVENOUS
  Filled 2015-01-15 (×16): qty 1

## 2015-01-15 MED ORDER — DIPHENHYDRAMINE HCL 12.5 MG/5ML PO ELIX
12.5000 mg | ORAL_SOLUTION | Freq: Four times a day (QID) | ORAL | Status: DC | PRN
  Administered 2015-01-19 – 2015-01-20 (×2): 12.5 mg via ORAL
  Filled 2015-01-15 (×3): qty 5

## 2015-01-15 MED ORDER — FOLIC ACID 1 MG PO TABS
1.0000 mg | ORAL_TABLET | Freq: Every morning | ORAL | Status: DC
  Administered 2015-01-15 – 2015-01-20 (×6): 1 mg via ORAL
  Filled 2015-01-15 (×6): qty 1

## 2015-01-15 MED ORDER — HYDROXYUREA 500 MG PO CAPS
500.0000 mg | ORAL_CAPSULE | Freq: Every day | ORAL | Status: DC
  Administered 2015-01-15 – 2015-01-16 (×2): 500 mg via ORAL
  Filled 2015-01-15 (×4): qty 1

## 2015-01-15 MED ORDER — VITAMIN D 1000 UNITS PO TABS
2000.0000 [IU] | ORAL_TABLET | Freq: Every day | ORAL | Status: DC
  Administered 2015-01-15 – 2015-01-20 (×6): 2000 [IU] via ORAL
  Filled 2015-01-15 (×6): qty 2

## 2015-01-15 MED ORDER — SENNOSIDES-DOCUSATE SODIUM 8.6-50 MG PO TABS
1.0000 | ORAL_TABLET | Freq: Two times a day (BID) | ORAL | Status: DC
  Administered 2015-01-15 – 2015-01-20 (×12): 1 via ORAL
  Filled 2015-01-15 (×12): qty 1

## 2015-01-15 MED ORDER — HYDROMORPHONE HCL 2 MG/ML IJ SOLN
2.0000 mg | INTRAMUSCULAR | Status: DC | PRN
  Administered 2015-01-15 – 2015-01-16 (×4): 2 mg via INTRAVENOUS
  Filled 2015-01-15 (×3): qty 1

## 2015-01-15 MED ORDER — ZOLPIDEM TARTRATE 10 MG PO TABS
10.0000 mg | ORAL_TABLET | Freq: Every evening | ORAL | Status: DC | PRN
  Administered 2015-01-15 – 2015-01-19 (×6): 10 mg via ORAL
  Filled 2015-01-15 (×6): qty 1

## 2015-01-15 MED ORDER — ONDANSETRON HCL 4 MG/2ML IJ SOLN: 4.0000 mg | Freq: Four times a day (QID) | INTRAMUSCULAR | Status: DC | PRN

## 2015-01-15 MED ORDER — ENOXAPARIN SODIUM 120 MG/0.8ML ~~LOC~~ SOLN
120.0000 mg | SUBCUTANEOUS | Status: DC
Start: 2015-01-15 — End: 2015-01-20
  Administered 2015-01-15 – 2015-01-20 (×6): 120 mg via SUBCUTANEOUS
  Filled 2015-01-15 (×6): qty 0.8

## 2015-01-15 MED ORDER — MORPHINE SULFATE ER 30 MG PO TBCR
30.0000 mg | EXTENDED_RELEASE_TABLET | Freq: Two times a day (BID) | ORAL | Status: DC
  Administered 2015-01-15 – 2015-01-20 (×12): 30 mg via ORAL
  Filled 2015-01-15 (×11): qty 1

## 2015-01-15 MED ORDER — POLYETHYLENE GLYCOL 3350 17 G PO PACK: 17.0000 g | PACK | Freq: Every day | ORAL | Status: DC | PRN

## 2015-01-15 MED ORDER — HYDROMORPHONE 1 MG/ML IV SOLN
INTRAVENOUS | Status: DC
  Administered 2015-01-15: 7.7 mg via INTRAVENOUS
  Administered 2015-01-15: 01:00:00 via INTRAVENOUS
  Administered 2015-01-15: 7.7 mg via INTRAVENOUS
  Administered 2015-01-15: 4.2 mg via INTRAVENOUS
  Filled 2015-01-15: qty 25

## 2015-01-15 MED ORDER — SODIUM CHLORIDE 0.9 % IJ SOLN: 9.0000 mL | INTRAMUSCULAR | Status: DC | PRN

## 2015-01-15 MED ORDER — POTASSIUM CHLORIDE CRYS ER 20 MEQ PO TBCR
20.0000 meq | EXTENDED_RELEASE_TABLET | Freq: Every morning | ORAL | Status: DC
  Administered 2015-01-15 – 2015-01-20 (×6): 20 meq via ORAL
  Filled 2015-01-15 (×6): qty 1

## 2015-01-15 MED ORDER — DEXTROSE-NACL 5-0.45 % IV SOLN
INTRAVENOUS | Status: DC
  Administered 2015-01-15: 17:00:00 via INTRAVENOUS
  Administered 2015-01-16: 1000 mL via INTRAVENOUS
  Administered 2015-01-16: 06:00:00 via INTRAVENOUS
  Administered 2015-01-17: 1000 mL via INTRAVENOUS
  Administered 2015-01-17: 10:00:00 via INTRAVENOUS
  Administered 2015-01-18: 1000 mL via INTRAVENOUS
  Administered 2015-01-18 – 2015-01-19 (×2): via INTRAVENOUS

## 2015-01-15 NOTE — H&P (Signed)
[facility] History and Physical  CARMINO ZIRKELBACH [id number] DOB: 01-May-1980 DOA: 01/14/2015  Referring physician: EDP PCP: [name], MD   Chief Complaint: Sickle cell crisis   HPI: [name] is a 34 y.o. 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  He is on chronic home O2, but easily desaturates with activity at baseline, and does not always wear his home O2 (most recent documentation of this was on 12/5).  Desaturation will typically cause his hemoglobin S to polymerize and set off a pain crisis.  This current crisis has been intermittently going on for the past 1 week he says, severe wrist and back pain today.  Patient presents to the ED with typical symptoms.  He was most recently discharged on the 12th.  Review of Systems: Systems reviewed.  As above, otherwise negative  Past Medical History  Diagnosis Date  . Sickle cell anemia (HCC)   . Blood transfusion   . Acute embolism and thrombosis of right internal jugular vein ([place])   . Hypokalemia   . Mood disorder ([place])   . History of pulmonary embolus (PE)   . Avascular necrosis (HCC)     Right Hip  . Leukocytosis     Chronic  . Thrombocytosis (HCC)     Chronic  . Hypertension   . History of Clostridium difficile infection   . Uses marijuana   . Chronic anticoagulation   . Functional asplenia   . Former smoker   . Second hand tobacco smoke exposure   . Alcohol consumption of one to four drinks per day   . Noncompliance with medication regimen   . Sickle-cell crisis with associated acute chest syndrome ([place]) 05/13/2013  . Acute chest syndrome ([place]) 06/18/2013  . Demand ischemia ([place]) 01/02/2014  . Pulmonary hypertension [facility])    Past Surgical History  Procedure Laterality Date  . Right hip replacement      08/2006  . Cholecystectomy      01/2008  .  Porta cath placement    . Porta cath removal    . Umbilical hernia repair      01/2008  . Excision of left periauricular cyst      10/2009  . Excision of right ear lobe cyst with primary closur      11/2007  . Portacath placement  01/05/2012    Procedure: INSERTION PORT-A-CATH;  Surgeon: Odis Hollingshead, MD;  Location: [facility];  Service: General;  Laterality: N/A;  ultrasound guiced port a cath insertion with fluoroscopy   Social History:  reports that he has quit smoking. He has never used smokeless tobacco. He reports that he uses illicit drugs (Marijuana) about twice per week. He reports that he does not drink alcohol.  No Known Allergies  Family History  Problem Relation Age of Onset  . Sickle cell trait Mother   . Depression Mother   . Diabetes Mother   . Sickle cell trait Father   . Sickle cell trait Brother      Prior to Admission medications   Medication Sig Start Date End Date Taking? Authorizing Provider  aspirin 81 MG chewable tablet Chew 1 tablet (81 mg total) by mouth daily. 07/23/14  Yes [name], MD  Cholecalciferol (VITAMIN D) 2000 UNITS tablet Take 1 tablet (2,000 Units total) by mouth daily. 02/13/14  Yes [name], MD  enoxaparin (LOVENOX) 120 MG/0.8ML injection Inject 0.8 mLs (120 mg total) into the skin daily. 12/03/14  Yes [name], MD  folic acid (FOLVITE) 1 MG tablet Take 1 tablet (1 mg total) by mouth every morning. 10/08/13  Yes [name], MD  HYDROmorphone (DILAUDID) 4 MG tablet Take 1 tablet (4 mg total) by mouth every 4 (four) hours as needed for severe pain. 12/23/14  Yes Dorena Dew, FNP  hydroxyurea (HYDREA) 500 MG capsule Take 1 capsule (500 mg total) by mouth daily. May take with food to minimize GI side effects. 11/06/14  Yes Dorena Dew, FNP  lisinopril (PRINIVIL,ZESTRIL) 10 MG tablet Take 1 tablet (10 mg total) by mouth daily. 01/22/14  Yes [name], MD  metoprolol succinate (TOPROL-XL) 50 MG  24 hr tablet Take 1 tablet (50 mg total) by mouth daily. 11/28/14  Yes [name], MD  morphine (MS CONTIN) 30 MG 12 hr tablet Take 1 tablet (30 mg total) by mouth every 12 (twelve) hours. 12/23/14  Yes Dorena Dew, FNP  potassium chloride SA (K-DUR,KLOR-CON) 20 MEQ tablet Take 1 tablet (20 mEq total) by mouth every morning. 06/09/14  Yes [name], MD  zolpidem (AMBIEN) 10 MG tablet Take 1 tablet (10 mg total) by mouth at bedtime as needed for sleep. 01/13/15  Yes Dorena Dew, FNP   Physical Exam: Filed Vitals:   01/14/15 2300 01/15/15 0000  BP: 109/73 104/73  Pulse: 77 70  Temp:    Resp: 21 19    BP 104/73 mmHg  Pulse 70  Temp(Src) 98.4 F (36.9 C) (Oral)  Resp 19  SpO2 98%  General Appearance:    Alert, oriented, no distress, appears stated age  Head:    Normocephalic, atraumatic  Eyes:    PERRL, EOMI, sclera non-icteric        Nose:   Nares without drainage or epistaxis. Mucosa, turbinates normal  Throat:   Moist mucous membranes. Oropharynx without erythema or exudate.  Neck:   Supple. No carotid bruits.  No thyromegaly.  No lymphadenopathy.   Back:     No CVA tenderness, no spinal tenderness  Lungs:     Clear to auscultation bilaterally, without wheezes, rhonchi or rales  Chest wall:    No tenderness to palpitation  Heart:    Regular rate and rhythm without murmurs, gallops, rubs  Abdomen:     Soft, non-tender, nondistended, normal bowel sounds, no organomegaly  Genitalia:    deferred  Rectal:    deferred  Extremities:   No clubbing, cyanosis or edema.  Pulses:   2+ and symmetric all extremities  Skin:   Skin color, texture, turgor normal, no rashes or lesions  Lymph nodes:   Cervical, supraclavicular, and axillary nodes normal  Neurologic:   CNII-XII intact. Normal strength, sensation and reflexes      throughout    Labs on Admission:  Basic Metabolic Panel:  Recent Labs Lab 01/08/15 1716 01/09/15 0604 01/14/15 2152  NA 139 139 138   K 3.5 3.6 3.7  CL 107 106 107  CO2 23 25 23   GLUCOSE 106* 116* 117*  BUN 9 10 13   CREATININE 0.59* 0.64 1.16  CALCIUM 9.2 9.1 9.0   Liver Function Tests:  Recent Labs Lab 01/08/15 1716 01/09/15 0604 01/14/15 2152  AST 42* 33 42*  ALT 30 25 27   ALKPHOS 88 80 95  BILITOT 7.3* 7.6* 5.8*  PROT 7.9 7.4 7.6  ALBUMIN 4.4 4.2 4.2  No results for input(s): LIPASE, AMYLASE in the last 168 hours. No results for input(s): AMMONIA in the last 168 hours. CBC:  Recent Labs Lab 01/08/15 1716 01/09/15 0604 01/12/15 1035 01/14/15 2152  WBC 20.1* 18.3* 16.4* 22.0*  NEUTROABS 15.5* 13.1* 11.6* 14.2*  HGB 8.1* 7.5* 6.4* 6.9*  HCT 23.7* 21.6* 18.0* 20.0*  MCV 91.5 91.1 87.4 90.9  PLT 474* 435* 417* 544*   Cardiac Enzymes: No results for input(s): CKTOTAL, CKMB, CKMBINDEX, TROPONINI in the last 168 hours.  BNP (last 3 results) No results for input(s): PROBNP in the last 8760 hours. CBG: No results for input(s): GLUCAP in the last 168 hours.  Radiological Exams on Admission: Dg Chest Portable 1 View  01/14/2015  CLINICAL DATA:  Sickle cell pain in the ribs and low back. Intermittently for the last week. EXAM: PORTABLE CHEST 1 VIEW COMPARISON:  01/08/2015 FINDINGS: Cardiac enlargement with mild pulmonary vascular congestion similar to prior study. Mild interstitial fibrosis. No evidence of edema or consolidation. No blunting of costophrenic angles. No pneumothorax. Mediastinal contours appear intact. Power port type central venous catheter with tip over the cavoatrial junction region. The sclerosis in the proximal left humerus consistent with bone infarct. IMPRESSION: Cardiac enlargement with mild pulmonary vascular congestion. No focal consolidation. Electronically Signed   By: Lucienne Capers M.D.   On: 01/14/2015 22:11    EKG: Independently reviewed.  Assessment/Plan Principal Problem:   Sickle cell crisis (HCC) Active Problems:   Chronic anticoagulation   Cor pulmonale,  chronic ([place])   1. Sickle cell crisis - 1. Admitting patient on weight based PCA dosing of dilaudid for sickle cell patients per his usual dosing levels. 2. Sickle cell pathway 3. Suspect that patient really needs to try and avoid hypoxia at home (use home oxygen consistently, especially with work) to help prevent crisis given his history of PAH (see HPI). 4. Continue hydrea 5. Continue MS contin, hold PO dilaudid 2. Chronic anticoagulation - continue lovenox  Code Status: Full Code  Family Communication: No family in room Disposition Plan: Admit to obs   Time spent: 30 min  Lyllian Gause M. [facility] Pager [phone]  If 7AM-7PM, please contact the day team taking care of the patient [email or web address] Password [email or web address] 01/15/2015, 12:13 AM

## 2015-01-15 NOTE — Progress Notes (Signed)
Patient ID: [name], male   DOB: Jul 31, 1980, 34 y.o.   MRN: [id number]  Pt seen after admission this morning. Pain currently at 8/10. Pt falling asleep after PCA dose. I have adjusted PCA to a lower dose and added Toradol and clinician assisted doses. Will also change IVF to D5.45. Re-assess clinical in the morning.

## 2015-01-16 DIAGNOSIS — Z7901 Long term (current) use of anticoagulants: Secondary | ICD-10-CM

## 2015-01-16 DIAGNOSIS — D5700 Hb-SS disease with crisis, unspecified: Principal | ICD-10-CM

## 2015-01-16 DIAGNOSIS — D638 Anemia in other chronic diseases classified elsewhere: Secondary | ICD-10-CM | POA: Diagnosis not present

## 2015-01-16 DIAGNOSIS — I2781 Cor pulmonale (chronic): Secondary | ICD-10-CM

## 2015-01-16 LAB — CBC WITH DIFFERENTIAL/PLATELET
Basophils Absolute: 0.2 10*3/uL — ABNORMAL HIGH (ref 0.0–0.1)
Basophils Relative: 1 %
EOS PCT: 6 %
Eosinophils Absolute: 1 10*3/uL — ABNORMAL HIGH (ref 0.0–0.7)
HCT: 17.3 % — ABNORMAL LOW (ref 39.0–52.0)
Hemoglobin: 5.9 g/dL — CL (ref 13.0–17.0)
Lymphocytes Relative: 21 %
Lymphs Abs: 3.6 10*3/uL (ref 0.7–4.0)
MCH: 31.2 pg (ref 26.0–34.0)
MCHC: 34.1 g/dL (ref 30.0–36.0)
MCV: 91.5 fL (ref 78.0–100.0)
MONO ABS: 1.7 10*3/uL — AB (ref 0.1–1.0)
MONOS PCT: 10 %
NEUTROS PCT: 62 %
Neutro Abs: 10.5 10*3/uL — ABNORMAL HIGH (ref 1.7–7.7)
PLATELETS: 491 10*3/uL — AB (ref 150–400)
RBC: 1.89 MIL/uL — AB (ref 4.22–5.81)
RDW: 19.9 % — ABNORMAL HIGH (ref 11.5–15.5)
WBC: 17 10*3/uL — AB (ref 4.0–10.5)

## 2015-01-16 LAB — RETICULOCYTES
RBC.: 1.89 MIL/uL — AB (ref 4.22–5.81)
RETIC COUNT ABSOLUTE: 268.4 10*3/uL — AB (ref 19.0–186.0)
Retic Ct Pct: 14.2 % — ABNORMAL HIGH (ref 0.4–3.1)

## 2015-01-16 LAB — LACTATE DEHYDROGENASE: LDH: 382 U/L — AB (ref 98–192)

## 2015-01-16 LAB — PREPARE RBC (CROSSMATCH)

## 2015-01-16 MED ORDER — SODIUM CHLORIDE 0.9 % IV SOLN
Freq: Once | INTRAVENOUS | Status: AC
  Administered 2015-01-17: 500 mL via INTRAVENOUS

## 2015-01-16 MED ORDER — HYDROMORPHONE HCL 2 MG/ML IJ SOLN
2.0000 mg | INTRAMUSCULAR | Status: DC | PRN
  Administered 2015-01-16 – 2015-01-20 (×36): 2 mg via INTRAVENOUS
  Filled 2015-01-16 (×29): qty 1

## 2015-01-16 NOTE — Care Management Note (Signed)
Case Management Note  Patient Details  Name: [name] MRN: [id number] Date of Birth: 1980/07/06  Subjective/Objective:             34 yo admitted with [facility]       Action/Plan: From home alone  Expected Discharge Date:  01/17/15               Expected Discharge Plan:  Home/Self Care  In-House Referral:     Discharge planning Services  CM Consult  Post Acute Care Choice:    Choice offered to:     DME Arranged:    DME Agency:     HH Arranged:    [place] Agency:     Status of Service:  Completed, signed off  Medicare Important Message Given:    Date Medicare IM Given:    Medicare IM give by:    Date Additional Medicare IM Given:    Additional Medicare Important Message give by:     If discussed at [place] of Stay Meetings, dates discussed:    Additional Comments:  Lynnell Catalan, RN 01/16/2015, 11:09 AM

## 2015-01-16 NOTE — Progress Notes (Signed)
SICKLE CELL SERVICE PROGRESS NOTE  [name] [id number] DOB: October 17, 1980 DOA: 01/14/2015 PCP: [name], MD  Assessment/Plan: Principal Problem:   Sickle cell crisis ([place]) Active Problems:   Cor pulmonale, chronic (HCC)   Chronic anticoagulation  1. Hb SS with crisis: Pt with significant pain. Will continue clinician assisted doses and  PCA at current dose. Also continue Toradol. Decrease IVF as patient eating and drinking well.   2. Leukocytosis: No evidence of infection.  3. Anemia of chronic Disease: Pt symptomatic. Will transfuse 1 unit RBC.  4. Chronic pain: Continue MS Contin 5. Chronic Hypoxic Respiratory failure: Pt almost at baseline but still decompensating with ambulation. Will transfuse 1 unit RBC to improve Oxygen Carrying capacity.  Code Status: Full Code Family Communication: N/A Disposition Plan: Not yet ready for discharge  [place].  Pager [phone]. If 7PM-7AM, please contact night-coverage.  01/16/2015, 3:39 PM  LOS: 1 day   Interim History: Pt still having pain 7/10 in ribs, Oxygen dropping to 80's on 4 L/min with ambulation  Consultants:  None  Procedures:  None  Antibiotics:  None   Objective: Filed Vitals:   01/16/15 1008 01/16/15 1029 01/16/15 1200 01/16/15 1434  BP: 135/82   117/78  Pulse: 84   70  Temp: 97.7 F (36.5 C)   98.3 F (36.8 C)  TempSrc: Oral   Oral  Resp: 18 18 19 14   Height:      Weight:      SpO2: 97% 94% 94% 94%   Weight change:   Intake/Output Summary (Last 24 hours) at 01/16/15 1539 Last data filed at 01/16/15 1435  Gross per 24 hour  Intake 2788.47 ml  Output   2400 ml  Net 388.47 ml    General: Alert, awake, oriented x3, in mild distress due to pain.  HEENT: [place]/AT PEERL, EOMI, anicteric, geographic tongue. Neck: Trachea midline,  no masses, no thyromegal,y no JVD, no carotid bruit OROPHARYNX:  Moist, No exudate/ erythema/lesions.  Heart: Regular rate and rhythm, without  murmurs, rubs, gallops, PMI non-displaced, no heaves or thrills on palpation.  Lungs: Clear to auscultation, no wheezing or rhonchi noted. No increased vocal fremitus resonant to percussion  Abdomen: Soft, nontender, nondistended, positive bowel sounds, no masses no hepatosplenomegaly noted..  Neuro: No focal neurological deficits noted cranial nerves II through XII grossly intact. Strength at functional baselinein bilateral upper and lower extremities. Musculoskeletal: No warm swelling or erythema around joints, no spinal tenderness noted. Psychiatric: Patient alert and oriented x3, good insight and cognition, good recent to remote recall.  Data Reviewed: Basic Metabolic Panel:  Recent Labs Lab 01/14/15 2152  NA 138  K 3.7  CL 107  CO2 23  GLUCOSE 117*  BUN 13  CREATININE 1.16  CALCIUM 9.0   Liver Function Tests:  Recent Labs Lab 01/14/15 2152  AST 42*  ALT 27  ALKPHOS 95  BILITOT 5.8*  PROT 7.6  ALBUMIN 4.2   No results for input(s): LIPASE, AMYLASE in the last 168 hours. No results for input(s): AMMONIA in the last 168 hours. CBC:  Recent Labs Lab 01/12/15 1035 01/14/15 2152 01/16/15 1242  WBC 16.4* 22.0* 17.0*  NEUTROABS 11.6* 14.2* PENDING  HGB 6.4* 6.9* 5.9*  HCT 18.0* 20.0* 17.3*  MCV 87.4 90.9 91.5  PLT 417* 544* 491*   Cardiac Enzymes: No results for input(s): CKTOTAL, CKMB, CKMBINDEX, TROPONINI in the last 168 hours. BNP (last 3 results)  Recent Labs  08/29/14 0810 09/10/14 0612 11/30/14 0910  BNP  1117.0* 549.5* 639.7*    ProBNP (last 3 results) No results for input(s): PROBNP in the last 8760 hours.  CBG: No results for input(s): GLUCAP in the last 168 hours.  No results found for this or any previous visit (from the past 240 hour(s)).   Studies: Dg Chest 2 View  12/29/2014  CLINICAL DATA:  Sickle Cell Crisis; SOB;dyspnea with exertion; no chest pain; Former Smoker; Hx HTN; Pulmonary Embolus; Patient has Port A Cath EXAM: CHEST  2  VIEW COMPARISON:  12/16/2014 FINDINGS: Left-sided Port-A-Cath tip to the lower superior vena cava. The heart is mildly enlarged. There has been some improvement in aeration of the lungs bilaterally. Very minimal opacities persist primarily in the right lower lobe. No frank consolidations. No pleural effusions or pulmonary edema. IMPRESSION: 1. Stable cardiomegaly. 2. Improved aeration. Electronically Signed   By: Nolon Nations M.D.   On: 12/29/2014 15:00   Dg Chest Portable 1 View  01/14/2015  CLINICAL DATA:  Sickle cell pain in the ribs and low back. Intermittently for the last week. EXAM: PORTABLE CHEST 1 VIEW COMPARISON:  01/08/2015 FINDINGS: Cardiac enlargement with mild pulmonary vascular congestion similar to prior study. Mild interstitial fibrosis. No evidence of edema or consolidation. No blunting of costophrenic angles. No pneumothorax. Mediastinal contours appear intact. Power port type central venous catheter with tip over the cavoatrial junction region. The sclerosis in the proximal left humerus consistent with bone infarct. IMPRESSION: Cardiac enlargement with mild pulmonary vascular congestion. No focal consolidation. Electronically Signed   By: Lucienne Capers M.D.   On: 01/14/2015 22:11   Dg Chest Portable 1 View  01/08/2015  CLINICAL DATA:  Acute onset of bilateral chest and rib pain. Initial encounter. EXAM: PORTABLE CHEST 1 VIEW COMPARISON:  Chest radiograph performed 12/29/2014 FINDINGS: The lungs are well-aerated. Vascular congestion is noted. There is no evidence of focal opacification, pleural effusion or pneumothorax. The cardiomediastinal silhouette is mildly enlarged. A left-sided chest port is noted ending about the cavoatrial junction. No acute osseous abnormalities are seen. IMPRESSION: Vascular congestion and mild cardiomegaly. Lungs remain grossly clear. No displaced rib fracture seen. Electronically Signed   By: Garald Balding M.D.   On: 01/08/2015 19:18    Scheduled  Meds: . sodium chloride   Intravenous Once  . aspirin  81 mg Oral Daily  . cholecalciferol  2,000 Units Oral Daily  . enoxaparin  120 mg Subcutaneous Q24H  . folic acid  1 mg Oral q morning - 10a  . HYDROmorphone   Intravenous 6 times per day  . ketorolac  30 mg Intravenous 4 times per day  . lisinopril  10 mg Oral Daily  . metoprolol succinate  50 mg Oral Daily  . morphine  30 mg Oral Q12H  . potassium chloride SA  20 mEq Oral q morning - 10a  . senna-docusate  1 tablet Oral BID   Continuous Infusions: . dextrose 5 % and 0.45% NaCl 100 mL/hr at 01/16/15 0555

## 2015-01-16 NOTE — Progress Notes (Signed)
PHARMACY BRIEF NOTE: HYDROXYUREA   By [facility] policy, hydroxyurea is automatically held when any of the following laboratory values occur:  ANC < 2 K  Pltc < 80K in sickle-cell patients; < 100K in other patients  Hgb <= 6 in sickle-cell patients; < 8 in other patients: hgb 5.9 Reticulocytes < 80K when Hgb < 9  Hydroxyurea has been held (discontinued from profile) per policy.   Dia Sitter, PharmD, BCPS 01/16/2015 3:13 PM

## 2015-01-17 DIAGNOSIS — D5700 Hb-SS disease with crisis, unspecified: Secondary | ICD-10-CM | POA: Diagnosis not present

## 2015-01-17 DIAGNOSIS — I2781 Cor pulmonale (chronic): Secondary | ICD-10-CM | POA: Diagnosis not present

## 2015-01-17 DIAGNOSIS — Z7901 Long term (current) use of anticoagulants: Secondary | ICD-10-CM | POA: Diagnosis not present

## 2015-01-17 DIAGNOSIS — D638 Anemia in other chronic diseases classified elsewhere: Secondary | ICD-10-CM | POA: Diagnosis not present

## 2015-01-17 DIAGNOSIS — J9611 Chronic respiratory failure with hypoxia: Secondary | ICD-10-CM

## 2015-01-17 LAB — CBC WITH DIFFERENTIAL/PLATELET
Basophils Absolute: 0.2 10*3/uL — ABNORMAL HIGH (ref 0.0–0.1)
Basophils Relative: 1 %
Eosinophils Absolute: 0.9 10*3/uL — ABNORMAL HIGH (ref 0.0–0.7)
Eosinophils Relative: 5 %
HEMATOCRIT: 19.8 % — AB (ref 39.0–52.0)
Hemoglobin: 6.9 g/dL — CL (ref 13.0–17.0)
LYMPHS ABS: 4 10*3/uL (ref 0.7–4.0)
Lymphocytes Relative: 23 %
MCH: 30.5 pg (ref 26.0–34.0)
MCHC: 34.8 g/dL (ref 30.0–36.0)
MCV: 87.6 fL (ref 78.0–100.0)
Monocytes Absolute: 1.7 10*3/uL — ABNORMAL HIGH (ref 0.1–1.0)
Monocytes Relative: 10 %
NEUTROS ABS: 10.4 10*3/uL — AB (ref 1.7–7.7)
Neutrophils Relative %: 61 %
Platelets: 538 10*3/uL — ABNORMAL HIGH (ref 150–400)
RBC: 2.26 MIL/uL — AB (ref 4.22–5.81)
RDW: 21.3 % — AB (ref 11.5–15.5)
WBC: 17.2 10*3/uL — AB (ref 4.0–10.5)

## 2015-01-17 LAB — BASIC METABOLIC PANEL
ANION GAP: 7 (ref 5–15)
BUN: 9 mg/dL (ref 6–20)
CALCIUM: 8.9 mg/dL (ref 8.9–10.3)
CO2: 23 mmol/L (ref 22–32)
Chloride: 110 mmol/L (ref 101–111)
Creatinine, Ser: 0.7 mg/dL (ref 0.61–1.24)
GFR calc Af Amer: >60 mL/min (ref >60)
GFR calc non Af Amer: >60 mL/min (ref >60)
GLUCOSE: 111 mg/dL — AB (ref 65–99)
Potassium: 4 mmol/L (ref 3.5–5.1)
Sodium: 140 mmol/L (ref 135–145)

## 2015-01-18 DIAGNOSIS — I2781 Cor pulmonale (chronic): Secondary | ICD-10-CM | POA: Diagnosis not present

## 2015-01-18 DIAGNOSIS — J9611 Chronic respiratory failure with hypoxia: Secondary | ICD-10-CM | POA: Diagnosis not present

## 2015-01-18 DIAGNOSIS — Z7901 Long term (current) use of anticoagulants: Secondary | ICD-10-CM | POA: Diagnosis not present

## 2015-01-18 DIAGNOSIS — D5700 Hb-SS disease with crisis, unspecified: Secondary | ICD-10-CM | POA: Diagnosis not present

## 2015-01-18 NOTE — Progress Notes (Signed)
SICKLE CELL SERVICE PROGRESS NOTE  AHAD ARGOMANIZ [id number] DOB: 10/19/1980 DOA: 01/14/2015 PCP: [name], MD  Assessment/Plan: Principal Problem:   Sickle cell crisis ([place]) Active Problems:   Cor pulmonale, chronic (HCC)   Chronic anticoagulation  1. Hb SS with crisis: Pt reports pain as 6/10 localized to ribs. He has used 36 mg with 49/48:demands/deliveries. Will continue clinician assisted doses and  PCA at current dose. Also continue Toradol. Will plan for transition to oral analgesics tomorrow.  2. Leukocytosis: No evidence of infection.  3. Anemia of chronic Disease: Pt symptomatic. S/P transfusion of 1 unit RBC.  4. Chronic pain: Continue MS Contin 5. Chronic Hypoxic Respiratory failure: Will check ambulatory pulse-ox today.    Code Status: Full Code Family Communication: N/A Disposition Plan: Anticipate discharge in 48 hours.  Teagan Ozawa A.  Pager [phone]. If 7PM-7AM, please contact night-coverage.  01/17/2015, 4:57 PM  LOS: 2 days   Interim History: Pt still having pain 7/10 in ribs, Oxygen now at baseline of 3 L/min with ambulation  Consultants:  None  Procedures:  None  Antibiotics:  None   Objective:  General: Alert, awake, oriented x3, in no apparent distress.  Vital Signs: Blood pressure 122/79, pulse 76, temperature 98 F (36.7 C), temperature source Oral, resp. rate 19, height 6' (1.829 m), weight 163 lb 9.6 oz (74.208 kg), SpO2 91 %. HEENT: [place]/AT [place]ER[place], EOMI, anicteric, geographic tongue. Neck: Trachea midline,  no masses, no thyromegal,y no JVD, no carotid bruit OROPHARYNX:  Moist, No exudate/ erythema/lesions.  Heart: Regular rate and rhythm, without murmurs, rubs, gallops, PMI non-displaced, no heaves or thrills on palpation.  Lungs: Clear to auscultation, no wheezing or rhonchi noted. No increased vocal fremitus resonant to percussion  Abdomen: Soft, nontender, nondistended, positive bowel sounds, no masses no  hepatosplenomegaly noted.  Neuro: No focal neurological deficits noted cranial nerves II through XII grossly intact. Strength at functional baselinein bilateral upper and lower extremities. Musculoskeletal: No warm swelling or erythema around joints, no spinal tenderness noted. Psychiatric: Patient alert and oriented x3, good insight and cognition, good recent to remote recall.  Data Reviewed: Basic Metabolic Panel:  Recent Labs Lab 01/14/15 2152 01/17/15 1120  NA 138 140  K 3.7 4.0  CL 107 110  CO2 23 23  GLUCOSE 117* 111*  BUN 13 9  CREATININE 1.16 0.70  CALCIUM 9.0 8.9   Liver Function Tests:  Recent Labs Lab 01/14/15 2152  AST 42*  ALT 27  ALKPHOS 95  BILITOT 5.8*  PROT 7.6  ALBUMIN 4.2   No results for input(s): LIPASE, AMYLASE in the last 168 hours. No results for input(s): AMMONIA in the last 168 hours. CBC:  Recent Labs Lab 01/12/15 1035 01/14/15 2152 01/16/15 1242 01/17/15 1120  WBC 16.4* 22.0* 17.0* 17.2*  NEUTROABS 11.6* 14.2* 10.5* 10.4*  HGB 6.4* 6.9* 5.9* 6.9*  HCT 18.0* 20.0* 17.3* 19.8*  MCV 87.4 90.9 91.5 87.6  PLT 417* 544* 491* 538*   Cardiac Enzymes: No results for input(s): CKTOTAL, CKMB, CKMBINDEX, TROPONINI in the last 168 hours. BNP (last 3 results)  Recent Labs  08/29/14 0810 09/10/14 0612 11/30/14 0910  BNP 1117.0* 549.5* 639.7*     Studies: Dg Chest 2 View  12/29/2014  CLINICAL DATA:  Sickle Cell Crisis; SOB;dyspnea with exertion; no chest pain; Former Smoker; Hx HTN; Pulmonary Embolus; Patient has Port A Cath EXAM: CHEST  2 VIEW COMPARISON:  12/16/2014 FINDINGS: Left-sided Port-A-Cath tip to the lower superior vena cava. The heart is mildly  enlarged. There has been some improvement in aeration of the lungs bilaterally. Very minimal opacities persist primarily in the right lower lobe. No frank consolidations. No pleural effusions or pulmonary edema. IMPRESSION: 1. Stable cardiomegaly. 2. Improved aeration. Electronically  Signed   By: Nolon Nations M.D.   On: 12/29/2014 15:00   Dg Chest Portable 1 View  01/14/2015  CLINICAL DATA:  Sickle cell pain in the ribs and low back. Intermittently for the last week. EXAM: PORTABLE CHEST 1 VIEW COMPARISON:  01/08/2015 FINDINGS: Cardiac enlargement with mild pulmonary vascular congestion similar to prior study. Mild interstitial fibrosis. No evidence of edema or consolidation. No blunting of costophrenic angles. No pneumothorax. Mediastinal contours appear intact. Power port type central venous catheter with tip over the cavoatrial junction region. The sclerosis in the proximal left humerus consistent with bone infarct. IMPRESSION: Cardiac enlargement with mild pulmonary vascular congestion. No focal consolidation. Electronically Signed   By: Lucienne Capers M.D.   On: 01/14/2015 22:11   Dg Chest Portable 1 View  01/08/2015  CLINICAL DATA:  Acute onset of bilateral chest and rib pain. Initial encounter. EXAM: PORTABLE CHEST 1 VIEW COMPARISON:  Chest radiograph performed 12/29/2014 FINDINGS: The lungs are well-aerated. Vascular congestion is noted. There is no evidence of focal opacification, pleural effusion or pneumothorax. The cardiomediastinal silhouette is mildly enlarged. A left-sided chest port is noted ending about the cavoatrial junction. No acute osseous abnormalities are seen. IMPRESSION: Vascular congestion and mild cardiomegaly. Lungs remain grossly clear. No displaced rib fracture seen. Electronically Signed   By: Garald Balding M.D.   On: 01/08/2015 19:18    Scheduled Meds: . aspirin  81 mg Oral Daily  . cholecalciferol  2,000 Units Oral Daily  . enoxaparin  120 mg Subcutaneous Q24H  . folic acid  1 mg Oral q morning - 10a  . HYDROmorphone   Intravenous 6 times per day  . ketorolac  30 mg Intravenous 4 times per day  . lisinopril  10 mg Oral Daily  . metoprolol succinate  50 mg Oral Daily  . morphine  30 mg Oral Q12H  . potassium chloride SA  20 mEq Oral q  morning - 10a  . senna-docusate  1 tablet Oral BID   Continuous Infusions: . dextrose 5 % and 0.45% NaCl 100 mL/hr at 01/18/15 1506   Time spent 20 minutes.

## 2015-01-18 NOTE — Progress Notes (Signed)
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 Hb SS with crisis: Pt reports pain as 7/10 in ribs. He has used 31 mg with 45/45/:demands/deliveries. Will continue clinician assisted doses and  PCA at current dose. Also continue Toradol. 2. Leukocytosis: No evidence of infection.  3. Anemia of chronic Disease: Pt symptomatic. S/P transfusion of 1 unit RBC.  4. Chronic pain: Continue MS Contin 5. Chronic Hypoxic Respiratory failure: Pt almost at baseline but still decompensating with ambulation. Will transfuse 1 unit RBC to improve Oxygen Carrying capacity.  Code Status: Full Code Family Communication: N/A Disposition Plan: Not yet ready for discharge  [place].  Pager [phone]. If 7PM-7AM, please contact night-coverage.  01/17/2015, 2:34 PM  LOS: 2 days   Interim History: Pt still having pain 7/10 in ribs, Oxygen now at baseline of 3 L/min with ambulation  Consultants:  None  Procedures:  None  Antibiotics:  None   Objective:  General: Alert, awake, oriented x3, in mild distress due to pain.  HEENT: [place]/AT PEERL, EOMI, anicteric, geographic tongue. Neck: Trachea midline,  no masses, no thyromegal,y no JVD, no carotid bruit OROPHARYNX:  Moist, No exudate/ erythema/lesions.  Heart: Regular rate and rhythm, without murmurs, rubs, gallops, PMI non-displaced, no heaves or thrills on palpation.  Lungs: Clear to auscultation, no wheezing or rhonchi noted. No increased vocal fremitus resonant to percussion  Abdomen: Soft, nontender, nondistended, positive bowel sounds, no masses no hepatosplenomegaly noted..  Neuro: No focal neurological deficits noted cranial nerves II through XII grossly intact. Strength at functional baselinein bilateral upper  and lower extremities. Musculoskeletal: No warm swelling or erythema around joints, no spinal tenderness noted. Psychiatric: Patient alert and oriented x3, good insight and cognition, good recent to remote recall.  Data Reviewed: Basic Metabolic Panel:  Recent Labs Lab 01/14/15 2152 01/17/15 1120  NA 138 140  K 3.7 4.0  CL 107 110  CO2 23 23  GLUCOSE 117* 111*  BUN 13 9  CREATININE 1.16 0.70  CALCIUM 9.0 8.9   Liver Function Tests:  Recent Labs Lab 01/14/15 2152  AST 42*  ALT 27  ALKPHOS 95  BILITOT 5.8*  PROT 7.6  ALBUMIN 4.2   No results for input(s): LIPASE, AMYLASE in the last 168 hours. No results for input(s): AMMONIA in the last 168 hours. CBC:  Recent Labs Lab 01/12/15 1035 01/14/15 2152 01/16/15 1242 01/17/15 1120  WBC 16.4* 22.0* 17.0* 17.2*  NEUTROABS 11.6* 14.2* 10.5* 10.4*  HGB 6.4* 6.9* 5.9* 6.9*  HCT 18.0* 20.0* 17.3* 19.8*  MCV 87.4 90.9 91.5 87.6  PLT 417* 544* 491* 538*   Cardiac Enzymes: No results for input(s): CKTOTAL, CKMB, CKMBINDEX, TROPONINI in the last 168 hours. BNP (last 3 results)  Recent Labs  08/29/14 0810 09/10/14 0612 11/30/14 0910  BNP 1117.0* 549.5* 639.7*     Studies: Dg Chest 2 View  12/29/2014  CLINICAL DATA:  Sickle Cell Crisis; SOB;dyspnea with exertion; no chest pain; Former Smoker; Hx HTN; Pulmonary Embolus; Patient has Port A Cath EXAM: CHEST  2 VIEW COMPARISON:  12/16/2014 FINDINGS: Left-sided Port-A-Cath tip to the lower superior vena cava. The heart is mildly enlarged. There has been some improvement in aeration of the lungs bilaterally. Very minimal opacities persist primarily in the right lower lobe. No frank consolidations. No pleural effusions  or pulmonary edema. IMPRESSION: 1. Stable cardiomegaly. 2. Improved aeration. Electronically Signed   By: Nolon Nations M.D.   On: 12/29/2014 15:00   Dg Chest Portable 1 View  01/14/2015  CLINICAL DATA:  Sickle cell pain in the ribs and low back.  Intermittently for the last week. EXAM: PORTABLE CHEST 1 VIEW COMPARISON:  01/08/2015 FINDINGS: Cardiac enlargement with mild pulmonary vascular congestion similar to prior study. Mild interstitial fibrosis. No evidence of edema or consolidation. No blunting of costophrenic angles. No pneumothorax. Mediastinal contours appear intact. Power port type central venous catheter with tip over the cavoatrial junction region. The sclerosis in the proximal left humerus consistent with bone infarct. IMPRESSION: Cardiac enlargement with mild pulmonary vascular congestion. No focal consolidation. Electronically Signed   By: Lucienne Capers M.D.   On: 01/14/2015 22:11   Dg Chest Portable 1 View  01/08/2015  CLINICAL DATA:  Acute onset of bilateral chest and rib pain. Initial encounter. EXAM: PORTABLE CHEST 1 VIEW COMPARISON:  Chest radiograph performed 12/29/2014 FINDINGS: The lungs are well-aerated. Vascular congestion is noted. There is no evidence of focal opacification, pleural effusion or pneumothorax. The cardiomediastinal silhouette is mildly enlarged. A left-sided chest port is noted ending about the cavoatrial junction. No acute osseous abnormalities are seen. IMPRESSION: Vascular congestion and mild cardiomegaly. Lungs remain grossly clear. No displaced rib fracture seen. Electronically Signed   By: Garald Balding M.D.   On: 01/08/2015 19:18    Scheduled Meds: . aspirin  81 mg Oral Daily  . cholecalciferol  2,000 Units Oral Daily  . enoxaparin  120 mg Subcutaneous Q24H  . folic acid  1 mg Oral q morning - 10a  . HYDROmorphone   Intravenous 6 times per day  . ketorolac  30 mg Intravenous 4 times per day  . lisinopril  10 mg Oral Daily  . metoprolol succinate  50 mg Oral Daily  . morphine  30 mg Oral Q12H  . potassium chloride SA  20 mEq Oral q morning - 10a  . senna-docusate  1 tablet Oral BID   Continuous Infusions: . dextrose 5 % and 0.45% NaCl 1,000 mL (01/18/15 0524)   Time spent 25  minutes.

## 2015-01-19 ENCOUNTER — Encounter: Payer: Self-pay | Admitting: Internal Medicine

## 2015-01-19 DIAGNOSIS — Z7901 Long term (current) use of anticoagulants: Secondary | ICD-10-CM | POA: Diagnosis not present

## 2015-01-19 DIAGNOSIS — D638 Anemia in other chronic diseases classified elsewhere: Secondary | ICD-10-CM | POA: Diagnosis not present

## 2015-01-19 DIAGNOSIS — I2781 Cor pulmonale (chronic): Secondary | ICD-10-CM | POA: Diagnosis not present

## 2015-01-19 DIAGNOSIS — D5700 Hb-SS disease with crisis, unspecified: Secondary | ICD-10-CM | POA: Diagnosis not present

## 2015-01-19 LAB — CBC WITH DIFFERENTIAL/PLATELET
BASOS PCT: 1 %
Basophils Absolute: 0.1 10*3/uL (ref 0.0–0.1)
EOS ABS: 0.6 10*3/uL (ref 0.0–0.7)
Eosinophils Relative: 4 %
HCT: 18.7 % — ABNORMAL LOW (ref 39.0–52.0)
Hemoglobin: 6.6 g/dL — CL (ref 13.0–17.0)
Lymphocytes Relative: 17 %
Lymphs Abs: 2.5 10*3/uL (ref 0.7–4.0)
MCH: 31 pg (ref 26.0–34.0)
MCHC: 35.3 g/dL (ref 30.0–36.0)
MCV: 87.8 fL (ref 78.0–100.0)
MONO ABS: 1.7 10*3/uL — AB (ref 0.1–1.0)
Monocytes Relative: 12 %
NEUTROS ABS: 9.6 10*3/uL — AB (ref 1.7–7.7)
NEUTROS PCT: 66 %
PLATELETS: 492 10*3/uL — AB (ref 150–400)
RBC: 2.13 MIL/uL — ABNORMAL LOW (ref 4.22–5.81)
RDW: 20.8 % — AB (ref 11.5–15.5)
WBC: 14.5 10*3/uL — ABNORMAL HIGH (ref 4.0–10.5)

## 2015-01-19 LAB — RETICULOCYTES
RBC.: 2.13 MIL/uL — ABNORMAL LOW (ref 4.22–5.81)
RETIC CT PCT: 15.1 % — AB (ref 0.4–3.1)
Retic Count, Absolute: 321.6 10*3/uL — ABNORMAL HIGH (ref 19.0–186.0)

## 2015-01-19 LAB — BASIC METABOLIC PANEL
Anion gap: 8 (ref 5–15)
BUN: 9 mg/dL (ref 6–20)
CHLORIDE: 110 mmol/L (ref 101–111)
CO2: 22 mmol/L (ref 22–32)
CREATININE: 0.68 mg/dL (ref 0.61–1.24)
Calcium: 8.7 mg/dL — ABNORMAL LOW (ref 8.9–10.3)
GFR calc Af Amer: >60 mL/min (ref >60)
GFR calc non Af Amer: >60 mL/min (ref >60)
Glucose, Bld: 99 mg/dL (ref 65–99)
Potassium: 4.3 mmol/L (ref 3.5–5.1)
Sodium: 140 mmol/L (ref 135–145)

## 2015-01-19 NOTE — Care Management Important Message (Signed)
Important Message  Patient Details  Name: [name] MRN: [id number] Date of Birth: [birth year]   Medicare Important Message Given:  Yes    [name] 01/19/2015, 11:36 AMImportant Message  Patient Details  Name: [name] MRN: [id number] Date of Birth: 11/17/1980   Medicare Important Message Given:  Yes    [name] 01/19/2015, 11:35 AM

## 2015-01-19 NOTE — Progress Notes (Signed)
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 Hb SS with crisis: Pt reports pain as 6/10 localized to ribs. He has used 37.97 mg with 62/58:demands/deliveries. Will transition to scheduled oral dilaudid continue PCA at current dose for PRN use. Also continue Toradol. Will plan for possible discharge tomorrow.  2. Leukocytosis: No evidence of infection. WBC imp[roved. 3. Anemia of chronic Disease: Symptoms improved. S/P transfusion of 1 unit RBC. Hb at baseline.  4. Chronic pain: Continue MS Contin 5. Chronic Hypoxic Respiratory failure: Will check ambulatory pulse-ox today.    Code Status: Full Code Family Communication: N/A Disposition Plan: Anticipate discharge in 48 hours.  [name],[name]  Pager [phone]. If 7PM-7AM, please contact night-coverage.  01/17/2015, 1:23 PM  LOS: 2 days   Interim History: Pt reports pain as 6/10 in ribs, Oxygen now at baseline of 3 L/min with ambulation  Consultants:  None  Procedures:  None  Antibiotics:  None   Objective:  General: Alert, awake, oriented x3, in no apparent distress.  Vital Signs: BP 126/84, HR 74, T 98.8 F (37.1 C), temperature source Oral, RR 16, height 6' (1.829 m), weight 163 lb 9.6 oz (74.208 kg), SpO2 94 %. HEENT: /AT PEERL, EOMI, anicteric, geographic tongue. Neck: Trachea midline,  no masses, no thyromegal,y no JVD, no carotid bruit OROPHARYNX:  Moist, No exudate/ erythema/lesions.  Heart: Regular rate and rhythm, without murmurs, rubs, gallops, PMI non-displaced, no heaves or thrills on palpation.  Lungs: Clear to auscultation, no wheezing or rhonchi noted. No increased vocal fremitus resonant to percussion  Abdomen: Soft, nontender, nondistended, positive bowel sounds, no  masses no hepatosplenomegaly noted.  Neuro: No focal neurological deficits noted cranial nerves II through XII grossly intact. Strength at functional baseline in bilateral upper and lower extremities. Musculoskeletal: No warm swelling or erythema around joints, no spinal tenderness noted. Psychiatric: Patient alert and oriented x3, good insight and cognition, good recent to remote recall.   Data Reviewed: Basic Metabolic Panel:  Recent Labs Lab 01/14/15 2152 01/17/15 1120 01/19/15 0635  NA 138 140 140  K 3.7 4.0 4.3  CL 107 110 110  CO2 23 23 22   GLUCOSE 117* 111* 99  BUN 13 9 9   CREATININE 1.16 0.70 0.68  CALCIUM 9.0 8.9 8.7*   Liver Function Tests:  Recent Labs Lab 01/14/15 2152  AST 42*  ALT 27  ALKPHOS 95  BILITOT 5.8*  PROT 7.6  ALBUMIN 4.2   No results for input(s): LIPASE, AMYLASE in the last 168 hours. No results for input(s): AMMONIA in the last 168 hours. CBC:  Recent Labs Lab 01/14/15 2152 01/16/15 1242 01/17/15 1120 01/19/15 0635  WBC 22.0* 17.0* 17.2* 14.5*  NEUTROABS 14.2* 10.5* 10.4* 9.6*  HGB 6.9* 5.9* 6.9* 6.6*  HCT 20.0* 17.3* 19.8* 18.7*  MCV 90.9 91.5 87.6 87.8  PLT 544* 491* 538* 492*   Cardiac Enzymes: No results for input(s): CKTOTAL, CKMB, CKMBINDEX, TROPONINI in the last 168 hours. BNP (last 3 results)  Recent Labs  08/29/14 0810 09/10/14 0612 11/30/14 0910  BNP 1117.0* 549.5* 639.7*     Studies: Dg Chest 2 View  12/29/2014  CLINICAL DATA:  Sickle Cell Crisis; SOB;dyspnea with exertion; no chest pain; Former Smoker; Hx HTN; Pulmonary Embolus; Patient has Port A Cath EXAM: CHEST  2 VIEW COMPARISON:  12/16/2014 FINDINGS: Left-sided Port-A-Cath tip to the lower superior vena cava. The heart is mildly enlarged. There has been some improvement in aeration of the lungs bilaterally. Very minimal opacities persist primarily in the right lower lobe. No frank consolidations. No pleural effusions or pulmonary edema. IMPRESSION: 1.  Stable cardiomegaly. 2. Improved aeration. Electronically Signed   By: Nolon Nations M.D.   On: 12/29/2014 15:00   Dg Chest Portable 1 View  01/14/2015  CLINICAL DATA:  Sickle cell pain in the ribs and low back. Intermittently for the last week. EXAM: PORTABLE CHEST 1 VIEW COMPARISON:  01/08/2015 FINDINGS: Cardiac enlargement with mild pulmonary vascular congestion similar to prior study. Mild interstitial fibrosis. No evidence of edema or consolidation. No blunting of costophrenic angles. No pneumothorax. Mediastinal contours appear intact. Power port type central venous catheter with tip over the cavoatrial junction region. The sclerosis in the proximal left humerus consistent with bone infarct. IMPRESSION: Cardiac enlargement with mild pulmonary vascular congestion. No focal consolidation. Electronically Signed   By: Lucienne Capers M.D.   On: 01/14/2015 22:11   Dg Chest Portable 1 View  01/08/2015  CLINICAL DATA:  Acute onset of bilateral chest and rib pain. Initial encounter. EXAM: PORTABLE CHEST 1 VIEW COMPARISON:  Chest radiograph performed 12/29/2014 FINDINGS: The lungs are well-aerated. Vascular congestion is noted. There is no evidence of focal opacification, pleural effusion or pneumothorax. The cardiomediastinal silhouette is mildly enlarged. A left-sided chest port is noted ending about the cavoatrial junction. No acute osseous abnormalities are seen. IMPRESSION: Vascular congestion and mild cardiomegaly. Lungs remain grossly clear. No displaced rib fracture seen. Electronically Signed   By: Garald Balding M.D.   On: 01/08/2015 19:18    Scheduled Meds: . aspirin  81 mg Oral Daily  . cholecalciferol  2,000 Units Oral Daily  . enoxaparin  120 mg Subcutaneous Q24H  . folic acid  1 mg Oral q morning - 10a  . HYDROmorphone   Intravenous 6 times per day  . ketorolac  30 mg Intravenous 4 times per day  . lisinopril  10 mg Oral Daily  . metoprolol succinate  50 mg Oral Daily  . morphine   30 mg Oral Q12H  . potassium chloride SA  20 mEq Oral q morning - 10a  . senna-docusate  1 tablet Oral BID   Continuous Infusions: . dextrose 5 % and 0.45% NaCl 20 mL/hr at 01/18/15 2107   Time spent 25 minutes.

## 2015-01-20 DIAGNOSIS — D5700 Hb-SS disease with crisis, unspecified: Secondary | ICD-10-CM | POA: Diagnosis not present

## 2015-01-20 DIAGNOSIS — J9611 Chronic respiratory failure with hypoxia: Secondary | ICD-10-CM | POA: Diagnosis not present

## 2015-01-20 DIAGNOSIS — Z7901 Long term (current) use of anticoagulants: Secondary | ICD-10-CM | POA: Diagnosis not present

## 2015-01-20 DIAGNOSIS — I2781 Cor pulmonale (chronic): Secondary | ICD-10-CM | POA: Diagnosis not present

## 2015-01-20 LAB — TYPE AND SCREEN
ABO/RH(D): O POS
Antibody Screen: NEGATIVE
UNIT DIVISION: 0
UNIT DIVISION: 0

## 2015-01-20 MED ORDER — HEPARIN SOD (PORK) LOCK FLUSH 100 UNIT/ML IV SOLN
500.0000 [IU] | INTRAVENOUS | Status: DC | PRN
  Filled 2015-01-20: qty 5

## 2015-01-20 NOTE — Discharge Summary (Signed)
[name] MRN: [id number] DOB/AGE: 04/14/1980 34 y.o.  Admit date: 01/14/2015 Discharge date: 01/20/2015  Primary Care Physician:  [name], MD   Discharge Diagnoses:   Patient Active Problem List   Diagnosis Date Noted  . Cor pulmonale, chronic ([place]) 08/25/2014    Priority: High  . Pulmonary HTN ([place]) 06/18/2013    Priority: High  . Functional asplenia     Priority: High  . Anemia of chronic disease 06/25/2014    Priority: Medium  . Chronic respiratory failure with hypoxia ([place]) 03/14/2014    Priority: Medium  . Hb-SS disease with crisis ([place]) 01/22/2014    Priority: Medium  . Chronic anticoagulation 08/22/2013    Priority: Medium  . Avascular necrosis ([place])     Priority: Low  . Syncope 11/30/2014  . Chest pain   . Hb-SS disease without crisis ([place]) 10/07/2014  . Acute respiratory failure with hypoxia ([place])   . Acute chest syndrome in sickle crisis ([place])   . PVC's (premature ventricular contractions) 09/10/2014  . Abnormal EKG 09/10/2014  . Hypoxemia   . Chronic atrial fibrillation ([place]) 09/09/2014  . Sickle cell crisis ([place]) 09/08/2014  . Bacteremia   . Elevated troponin I level 08/29/2014  . Ankle edema 07/07/2014  . Sickle cell anemia ([place]) 06/25/2014  . PAH (pulmonary artery hypertension) ([place]) 03/18/2014  . Paralytic strabismus, external ophthalmoplegia   . Chronic pain syndrome 12/12/2013  . Essential hypertension 08/22/2013  . Vitamin D deficiency 02/13/2013  . Embolism, pulmonary with infarction ([place]) 07/09/2012  . Hx of pulmonary embolus 06/29/2012  . Hemochromatosis 12/14/2011    DISCHARGE MEDICATION:   Medication List    TAKE these medications        aspirin 81 MG chewable tablet  Chew 1 tablet (81 mg total) by mouth daily.     enoxaparin 120 MG/0.8ML injection  Commonly known as:  LOVENOX  Inject 0.8 mLs (120 mg total) into the skin daily.     folic acid 1 MG tablet  Commonly known as:  FOLVITE  Take 1 tablet (1 mg  total) by mouth every morning.     HYDROmorphone 4 MG tablet  Commonly known as:  DILAUDID  Take 1 tablet (4 mg total) by mouth every 4 (four) hours as needed for severe pain.     hydroxyurea 500 MG capsule  Commonly known as:  HYDREA  Take 1 capsule (500 mg total) by mouth daily. May take with food to minimize GI side effects.     lisinopril 10 MG tablet  Commonly known as:  PRINIVIL,ZESTRIL  Take 1 tablet (10 mg total) by mouth daily.     metoprolol succinate 50 MG 24 hr tablet  Commonly known as:  TOPROL-XL  Take 1 tablet (50 mg total) by mouth daily.     morphine 30 MG 12 hr tablet  Commonly known as:  MS CONTIN  Take 1 tablet (30 mg total) by mouth every 12 (twelve) hours.     potassium chloride SA 20 MEQ tablet  Commonly known as:  K-DUR,KLOR-CON  Take 1 tablet (20 mEq total) by mouth every morning.     Vitamin D 2000 UNITS tablet  Take 1 tablet (2,000 Units total) by mouth daily.     zolpidem 10 MG tablet  Commonly known as:  AMBIEN  Take 1 tablet (10 mg total) by mouth at bedtime as needed for sleep.          Consults:     SIGNIFICANT DIAGNOSTIC STUDIES:  Dg Chest 2 View  12/29/2014  CLINICAL DATA:  Sickle Cell Crisis; SOB;dyspnea with exertion; no chest pain; Former Smoker; Hx HTN; Pulmonary Embolus; Patient has Port A Cath EXAM: CHEST  2 VIEW COMPARISON:  12/16/2014 FINDINGS: Left-sided Port-A-Cath tip to the lower superior vena cava. The heart is mildly enlarged. There has been some improvement in aeration of the lungs bilaterally. Very minimal opacities persist primarily in the right lower lobe. No frank consolidations. No pleural effusions or pulmonary edema. IMPRESSION: 1. Stable cardiomegaly. 2. Improved aeration. Electronically Signed   By: Nolon Nations M.D.   On: 12/29/2014 15:00   Dg Chest Portable 1 View  01/14/2015  CLINICAL DATA:  Sickle cell pain in the ribs and low back. Intermittently for the last week. EXAM: PORTABLE CHEST 1 VIEW  COMPARISON:  01/08/2015 FINDINGS: Cardiac enlargement with mild pulmonary vascular congestion similar to prior study. Mild interstitial fibrosis. No evidence of edema or consolidation. No blunting of costophrenic angles. No pneumothorax. Mediastinal contours appear intact. Power port type central venous catheter with tip over the cavoatrial junction region. The sclerosis in the proximal left humerus consistent with bone infarct. IMPRESSION: Cardiac enlargement with mild pulmonary vascular congestion. No focal consolidation. Electronically Signed   By: Lucienne Capers M.D.   On: 01/14/2015 22:11   Dg Chest Portable 1 View  01/08/2015  CLINICAL DATA:  Acute onset of bilateral chest and rib pain. Initial encounter. EXAM: PORTABLE CHEST 1 VIEW COMPARISON:  Chest radiograph performed 12/29/2014 FINDINGS: The lungs are well-aerated. Vascular congestion is noted. There is no evidence of focal opacification, pleural effusion or pneumothorax. The cardiomediastinal silhouette is mildly enlarged. A left-sided chest port is noted ending about the cavoatrial junction. No acute osseous abnormalities are seen. IMPRESSION: Vascular congestion and mild cardiomegaly. Lungs remain grossly clear. No displaced rib fracture seen. Electronically Signed   By: Garald Balding M.D.   On: 01/08/2015 19:18      No results found for this or any previous visit (from the past 240 hour(s)).  BRIEF ADMITTING H & P: [name] is a 34 y.o. 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 He is on chronic home O2, but easily desaturates with activity at baseline, and does not always wear his home O2 (most recent documentation of this was on 12/5). Desaturation will typically cause his hemoglobin S to polymerize and set off a pain crisis.  This current crisis has been intermittently going on for  the past 1 week he says, severe wrist and back pain today. Patient presents to the ED with typical symptoms. He was most recently discharged on the 12th.   Hospital Course:  Present on Admission:  . Sickle cell crisis [facility]): Pt's pain was managed with Dilaudid via PCA and Toradol. He was transitioned to oral analgesics as pain improved. He is discharged home on pre-hospital analgesics with MS Contin and oral Dilaudid.  . Anemia of Chronic Disease: Pt was transfused 1 unit RBC's during this hospitalization. Att the time of discharge his Hb was at baseline of 6.6 g/dl.   . Cor pulmonale, chronic ([place]): Pt has known right sided heart failure and a baseline Oxygen requirement of 3 l/min. At the time of discharge his Oxygen requirement is at baseline.  . Leukocytosis: Pt has a chronically elevated WBC count. At the time of discharge his WBC count was at baseline  of 14.5.  Marland Kitchen Chronic respiratory failure with hypoxia ([place]): Oxygen requirement at baseline.   Disposition and Follow-up:  Pt discharged home in good condition and is to follow up with      Discharge Instructions    Activity as tolerated - No restrictions    Complete by:  As directed      Diet general    Complete by:  As directed            DISCHARGE EXAM:  General: Alert, awake, oriented x3, in no apparent distress.  Vital Signs: BP131/89, HR 67, T 98.1 F (36.7 C), temperature source Oral, RR 14, height 6' (1.829 m), weight 173 lb 4.5 oz (78.6 kg), SpO2 100 % on 4 l/min.  HEENT: [place]/AT PEERL, EOMI, mild icterus at baseline. Neck: Trachea midline, no masses, no thyromegal,y no JVD, no carotid bruit OROPHARYNX: Moist, No exudate/ erythema/lesions.  Heart: Regular rate and rhythm, without murmurs, rubs, gallops or S3. PMI non-displaced. Exam reveals no decreased pulses. Pulmonary/Chest: Normal effort. Breath sounds normal. No. Apnea. Clear to auscultation,no stridor,  no wheezing and no rhonchi noted. No respiratory  distress and no tenderness noted. Abdomen: Soft, nontender, nondistended, normal bowel sounds, no masses no hepatosplenomegaly noted. No fluid wave and no ascites. There is no guarding or rebound. Neuro: Alert and oriented to person, place and time. Normal motor skills, Displays no atrophy or tremors and exhibits normal muscle tone.  No focal neurological deficits noted cranial nerves II through XII grossly intact. No sensory deficit noted. Strength at baseline in bilateral upper and lower extremities. Gait normal. Musculoskeletal: No warm swelling or erythema around joints, no spinal tenderness noted. Psychiatric: Patient alert and oriented x3, good insight and cognition, good recent to remote recall. Mood, memory, affect and judgement normal Lymph node survey: No cervical axillary or inguinal lymphadenopathy noted. Skin: Skin is warm and dry. No bruising, no ecchymosis and no rash noted. Pt is not diaphoretic. No erythema. No pallor    Recent Labs  01/19/15 0635  NA 140  K 4.3  CL 110  CO2 22  GLUCOSE 99  BUN 9  CREATININE 0.68  CALCIUM 8.7*   No results for input(s): AST, ALT, ALKPHOS, BILITOT, PROT, ALBUMIN in the last 72 hours. No results for input(s): LIPASE, AMYLASE in the last 72 hours.  Recent Labs  01/19/15 0635  WBC 14.5*  NEUTROABS 9.6*  HGB 6.6*  HCT 18.7*  MCV 87.8  PLT 492*     Total time spent including face to face and decision making was greater than 30 minutes  Signed: [name],[name] 01/20/2015, 11:20 AM

## 2015-01-22 ENCOUNTER — Encounter (HOSPITAL_COMMUNITY): Payer: Self-pay | Admitting: Emergency Medicine

## 2015-01-22 ENCOUNTER — Emergency Department (HOSPITAL_COMMUNITY)
Admission: EM | Admit: 2015-01-22 | Discharge: 2015-01-22 | Disposition: A | Discharge status: Home / Self Care | Payer: Medicare Other | Attending: Emergency Medicine | Admitting: Emergency Medicine

## 2015-01-22 ENCOUNTER — Telehealth (HOSPITAL_COMMUNITY): Payer: Self-pay | Admitting: Hematology

## 2015-01-22 DIAGNOSIS — M546 Pain in thoracic spine: Secondary | ICD-10-CM | POA: Insufficient documentation

## 2015-01-22 DIAGNOSIS — Z86711 Personal history of pulmonary embolism: Secondary | ICD-10-CM | POA: Insufficient documentation

## 2015-01-22 DIAGNOSIS — R52 Pain, unspecified: Secondary | ICD-10-CM

## 2015-01-22 DIAGNOSIS — Z9049 Acquired absence of other specified parts of digestive tract: Secondary | ICD-10-CM | POA: Insufficient documentation

## 2015-01-22 DIAGNOSIS — D571 Sickle-cell disease without crisis: Secondary | ICD-10-CM | POA: Diagnosis not present

## 2015-01-22 DIAGNOSIS — Z87891 Personal history of nicotine dependence: Secondary | ICD-10-CM | POA: Insufficient documentation

## 2015-01-22 DIAGNOSIS — Z7982 Long term (current) use of aspirin: Secondary | ICD-10-CM | POA: Insufficient documentation

## 2015-01-22 DIAGNOSIS — Z8619 Personal history of other infectious and parasitic diseases: Secondary | ICD-10-CM | POA: Insufficient documentation

## 2015-01-22 DIAGNOSIS — E876 Hypokalemia: Secondary | ICD-10-CM | POA: Insufficient documentation

## 2015-01-22 DIAGNOSIS — D5701 Hb-SS disease with acute chest syndrome: Secondary | ICD-10-CM | POA: Insufficient documentation

## 2015-01-22 DIAGNOSIS — Z7901 Long term (current) use of anticoagulants: Secondary | ICD-10-CM | POA: Insufficient documentation

## 2015-01-22 DIAGNOSIS — R079 Chest pain, unspecified: Secondary | ICD-10-CM | POA: Insufficient documentation

## 2015-01-22 DIAGNOSIS — Q8901 Asplenia (congenital): Secondary | ICD-10-CM | POA: Insufficient documentation

## 2015-01-22 DIAGNOSIS — Z9114 Patient's other noncompliance with medication regimen: Secondary | ICD-10-CM | POA: Insufficient documentation

## 2015-01-22 DIAGNOSIS — I10 Essential (primary) hypertension: Secondary | ICD-10-CM | POA: Insufficient documentation

## 2015-01-22 MED ORDER — KETOROLAC TROMETHAMINE 30 MG/ML IJ SOLN
30.0000 mg | Freq: Once | INTRAMUSCULAR | Status: AC
  Administered 2015-01-22: 30 mg via INTRAMUSCULAR
  Filled 2015-01-22: qty 1

## 2015-01-22 MED ORDER — HYDROMORPHONE HCL 2 MG/ML IJ SOLN
2.0000 mg | Freq: Once | INTRAMUSCULAR | Status: AC
  Administered 2015-01-22: 2 mg via INTRAMUSCULAR
  Filled 2015-01-22: qty 1

## 2015-01-22 NOTE — Discharge Instructions (Signed)
As discussed, your evaluation today has been largely reassuring.  But, it is important that you monitor your condition carefully, and do not hesitate to return to the ED if you develop new, or concerning changes in your condition. ? ?Otherwise, please follow-up with your physician for appropriate ongoing care. ? ?

## 2015-01-22 NOTE — Telephone Encounter (Signed)
Patient C/O pain to ribs that is 9/10 on pain scale.  Patient requesting to come to [facility].  I explained to patient that [facility] is closed. If patient cant manage his pain on his home medication then he can go to the ED.  Patient verbalizes understanding.

## 2015-01-22 NOTE — ED Notes (Signed)
Patient here with complaints of sickle cell pain crisis that started this am. Pain 10/10 in back and rib cage radiating into hip.

## 2015-01-22 NOTE — ED Provider Notes (Signed)
CSN: [id number]     Arrival date & time 01/22/15  [id number] History   First MD Initiated Contact with Patient 01/22/15 [phone]     Chief Complaint  Patient presents with  . Sickle Cell Pain Crisis    HPI  Patient presents with concern of diffuse thoracic pain. This episode began yesterday, has not been substantially improved in spite of using narcotics, including MS Contin, Dilaudid. No new dyspnea, fever, nausea, vomiting, other changes from baseline. Symptoms are similar to those he experiences frequently, during likely sickle cell flares.  Past Medical History  Diagnosis Date  . Sickle cell anemia (HCC)   . Blood transfusion   . Acute embolism and thrombosis of right internal jugular vein ([place])   . Hypokalemia   . Mood disorder ([place])   . History of pulmonary embolus (PE)   . Avascular necrosis (HCC)     Right Hip  . Leukocytosis     Chronic  . Thrombocytosis (HCC)     Chronic  . Hypertension   . History of Clostridium difficile infection   . Uses marijuana   . Chronic anticoagulation   . Functional asplenia   . Former smoker   . Second hand tobacco smoke exposure   . Alcohol consumption of one to four drinks per day   . Noncompliance with medication regimen   . Sickle-cell crisis with associated acute chest syndrome ([place]) 05/13/2013  . Acute chest syndrome ([place]) 06/18/2013  . Demand ischemia ([place]) 01/02/2014  . Pulmonary hypertension [facility])    Past Surgical History  Procedure Laterality Date  . Right hip replacement      08/2006  . Cholecystectomy      01/2008  . Porta cath placement    . Porta cath removal    . Umbilical hernia repair      01/2008  . Excision of left periauricular cyst      10/2009  . Excision of right ear lobe cyst with primary closur      11/2007  . Portacath placement  01/05/2012    Procedure: INSERTION PORT-A-CATH;  Surgeon: Odis Hollingshead, MD;  Location: [place];  Service: General;  Laterality: N/A;  ultrasound guiced port a cath insertion with  fluoroscopy   Family History  Problem Relation Age of Onset  . Sickle cell trait Mother   . Depression Mother   . Diabetes Mother   . Sickle cell trait Father   . Sickle cell trait Brother    Social History  Substance Use Topics  . Smoking status: Former Smoker -- 13 years  . Smokeless tobacco: Never Used  . Alcohol Use: No    Review of Systems  Constitutional:       Per HPI, otherwise negative  HENT:       Per HPI, otherwise negative  Respiratory:       Per HPI, otherwise negative  Cardiovascular:       Per HPI, otherwise negative  Gastrointestinal: Negative for nausea and vomiting.  Endocrine:       Negative aside from HPI  Genitourinary:       Neg aside from HPI   Musculoskeletal:       Per HPI, otherwise negative  Skin: Negative for color change.  Neurological: Negative for syncope, weakness and headaches.      Allergies  Review of patient's allergies indicates no known allergies.  Home Medications   Prior to Admission medications   Medication Sig Start Date End Date Taking? Authorizing Provider  aspirin 81 MG chewable tablet Chew 1 tablet (81 mg total) by mouth daily. 07/23/14   [name], MD  Cholecalciferol (VITAMIN D) 2000 UNITS tablet Take 1 tablet (2,000 Units total) by mouth daily. 02/13/14   [name], MD  enoxaparin (LOVENOX) 120 MG/0.8ML injection Inject 0.8 mLs (120 mg total) into the skin daily. 12/03/14   [name], MD  folic acid (FOLVITE) 1 MG tablet Take 1 tablet (1 mg total) by mouth every morning. 10/08/13   [name], MD  HYDROmorphone (DILAUDID) 4 MG tablet Take 1 tablet (4 mg total) by mouth every 4 (four) hours as needed for severe pain. 12/23/14   Dorena Dew, FNP  hydroxyurea (HYDREA) 500 MG capsule Take 1 capsule (500 mg total) by mouth daily. May take with food to minimize GI side effects. 11/06/14   Dorena Dew, FNP  lisinopril (PRINIVIL,ZESTRIL) 10 MG tablet Take 1 tablet (10 mg total) by  mouth daily. 01/22/14   [name], MD  metoprolol succinate (TOPROL-XL) 50 MG 24 hr tablet Take 1 tablet (50 mg total) by mouth daily. 11/28/14   [name], MD  morphine (MS CONTIN) 30 MG 12 hr tablet Take 1 tablet (30 mg total) by mouth every 12 (twelve) hours. 12/23/14   Dorena Dew, FNP  potassium chloride SA (K-DUR,KLOR-CON) 20 MEQ tablet Take 1 tablet (20 mEq total) by mouth every morning. 06/09/14   [name], MD  zolpidem (AMBIEN) 10 MG tablet Take 1 tablet (10 mg total) by mouth at bedtime as needed for sleep. 01/13/15   Dorena Dew, FNP   BP 130/87 mmHg  Pulse 92  Temp(Src) 98.8 F (37.1 C) (Oral)  Resp 16  SpO2 92% Physical Exam  Constitutional: He is oriented to person, place, and time. He appears well-developed. No distress.  HENT:  Head: Normocephalic and atraumatic.  Eyes: Conjunctivae and EOM are normal.  Cardiovascular: Normal rate and regular rhythm.   Pulmonary/Chest: Effort normal. No stridor. No respiratory distress. He has no wheezes. He has no rales.  Abdominal: He exhibits no distension. There is no tenderness.  Musculoskeletal: He exhibits no edema.  Neurological: He is alert and oriented to person, place, and time.  Skin: Skin is warm and dry.  Psychiatric: He has a normal mood and affect.  Nursing note and vitals reviewed.   ED Course  Procedures (including critical care time) Pulse oximetry 94% room air borderline Cardiac 90 sinus normal  Chart review notable for more than 20 visits over the past 6 months for sickle cell episodes.  After the initial evaluation the patient received analgesia 6. I discussed the patient's case with our sickle-cell care center, which is not open today.   MDM  Patient presents with ongoing chest, back pain. Here, the patient is in no distress, vital signs are unremarkable, consistent with multiple prior evaluations. No evidence for acute chest syndrome. Patient's pain improved, no  evidence for acute occlusive episode. Patient discharged in stable condition.  Carmin Muskrat, MD 01/22/15 [phone]

## 2015-01-22 NOTE — ED Provider Notes (Signed)
  Physical Exam  BP 124/89 mmHg  Pulse 67  Temp(Src) 97.7 F (36.5 C) (Oral)  Resp 20  Ht 6' (1.829 m)  Wt 75.07 kg  BMI 22.44 kg/m2  SpO2 92%  Physical Exam Pain is 7/10 after 3rd dose of pain medication.  ED Course  Procedures Patient was signed out to me by Harlene Ramus, PA-C.  After 1st dose of pain meds pt reports pain has improved from 8/10 to 6/10. Labs at pt's baseline values. EKG unchanged. After 3rd dose of pain meds pt reports he is still having pain (7/10). Consulted hospitalist, Dr. Jonelle Sidle recommends to given pt one more dose of 2mg  dilaudid and states to reevaluate the pt's pain in 1 hour. He notes he will come see in the pt in the ED and if his pain has not improved after this dose of pain meds he will admit the pain for further pain management.  I spoke to Dr. Jonelle Sidle who agrees with admission.     Ottie Glazier, PA-C 01/22/15 [id number]  Dorie Rank, MD 01/22/15 [phone]

## 2015-01-23 IMAGING — CR DG CHEST 2V
2 series · 2 of 2 positions shown · non-contrast
Comparison: January 14, 2012

CLINICAL DATA: Sickle cell crisis

CHEST - 2 VIEW

[w chest pa]
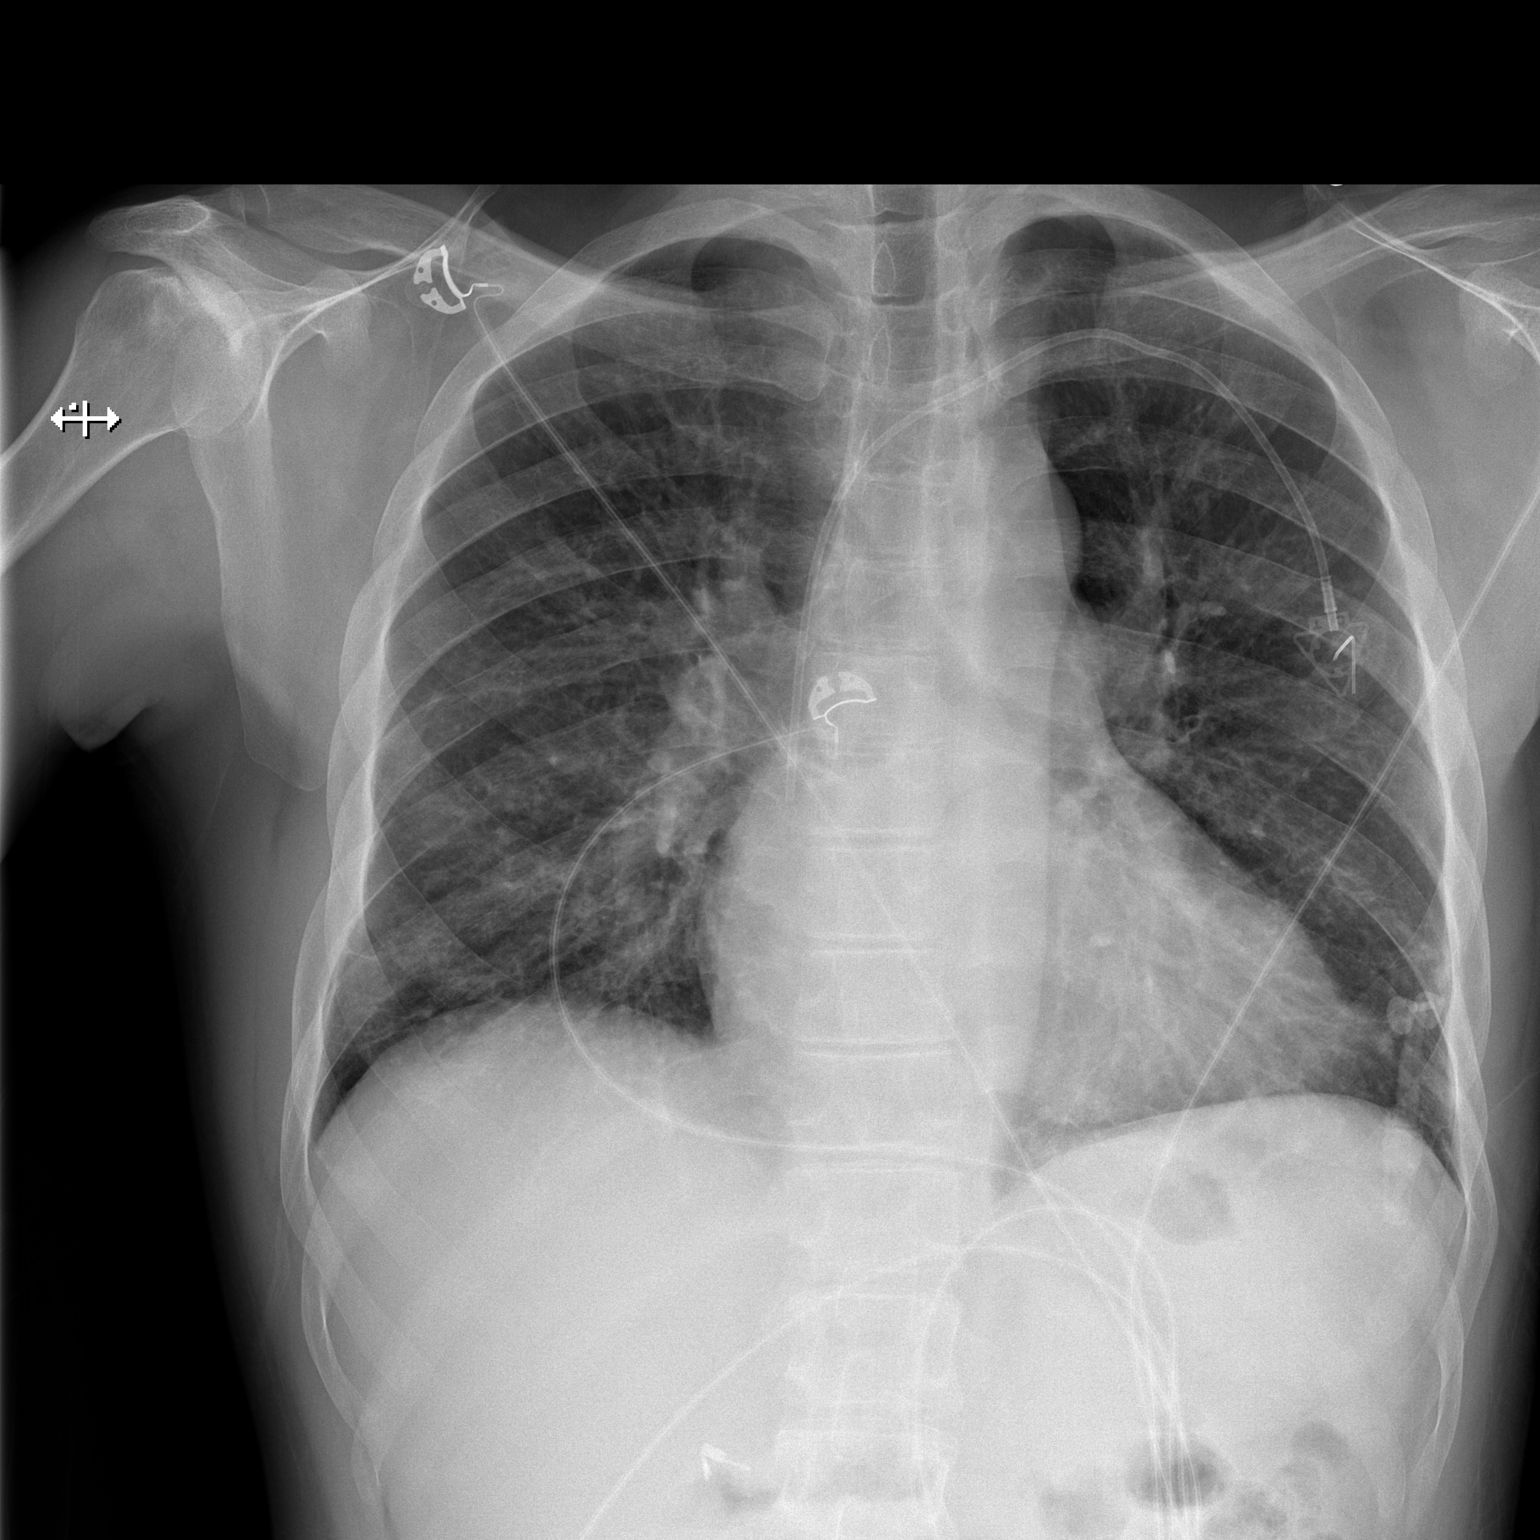

[w chest lat]
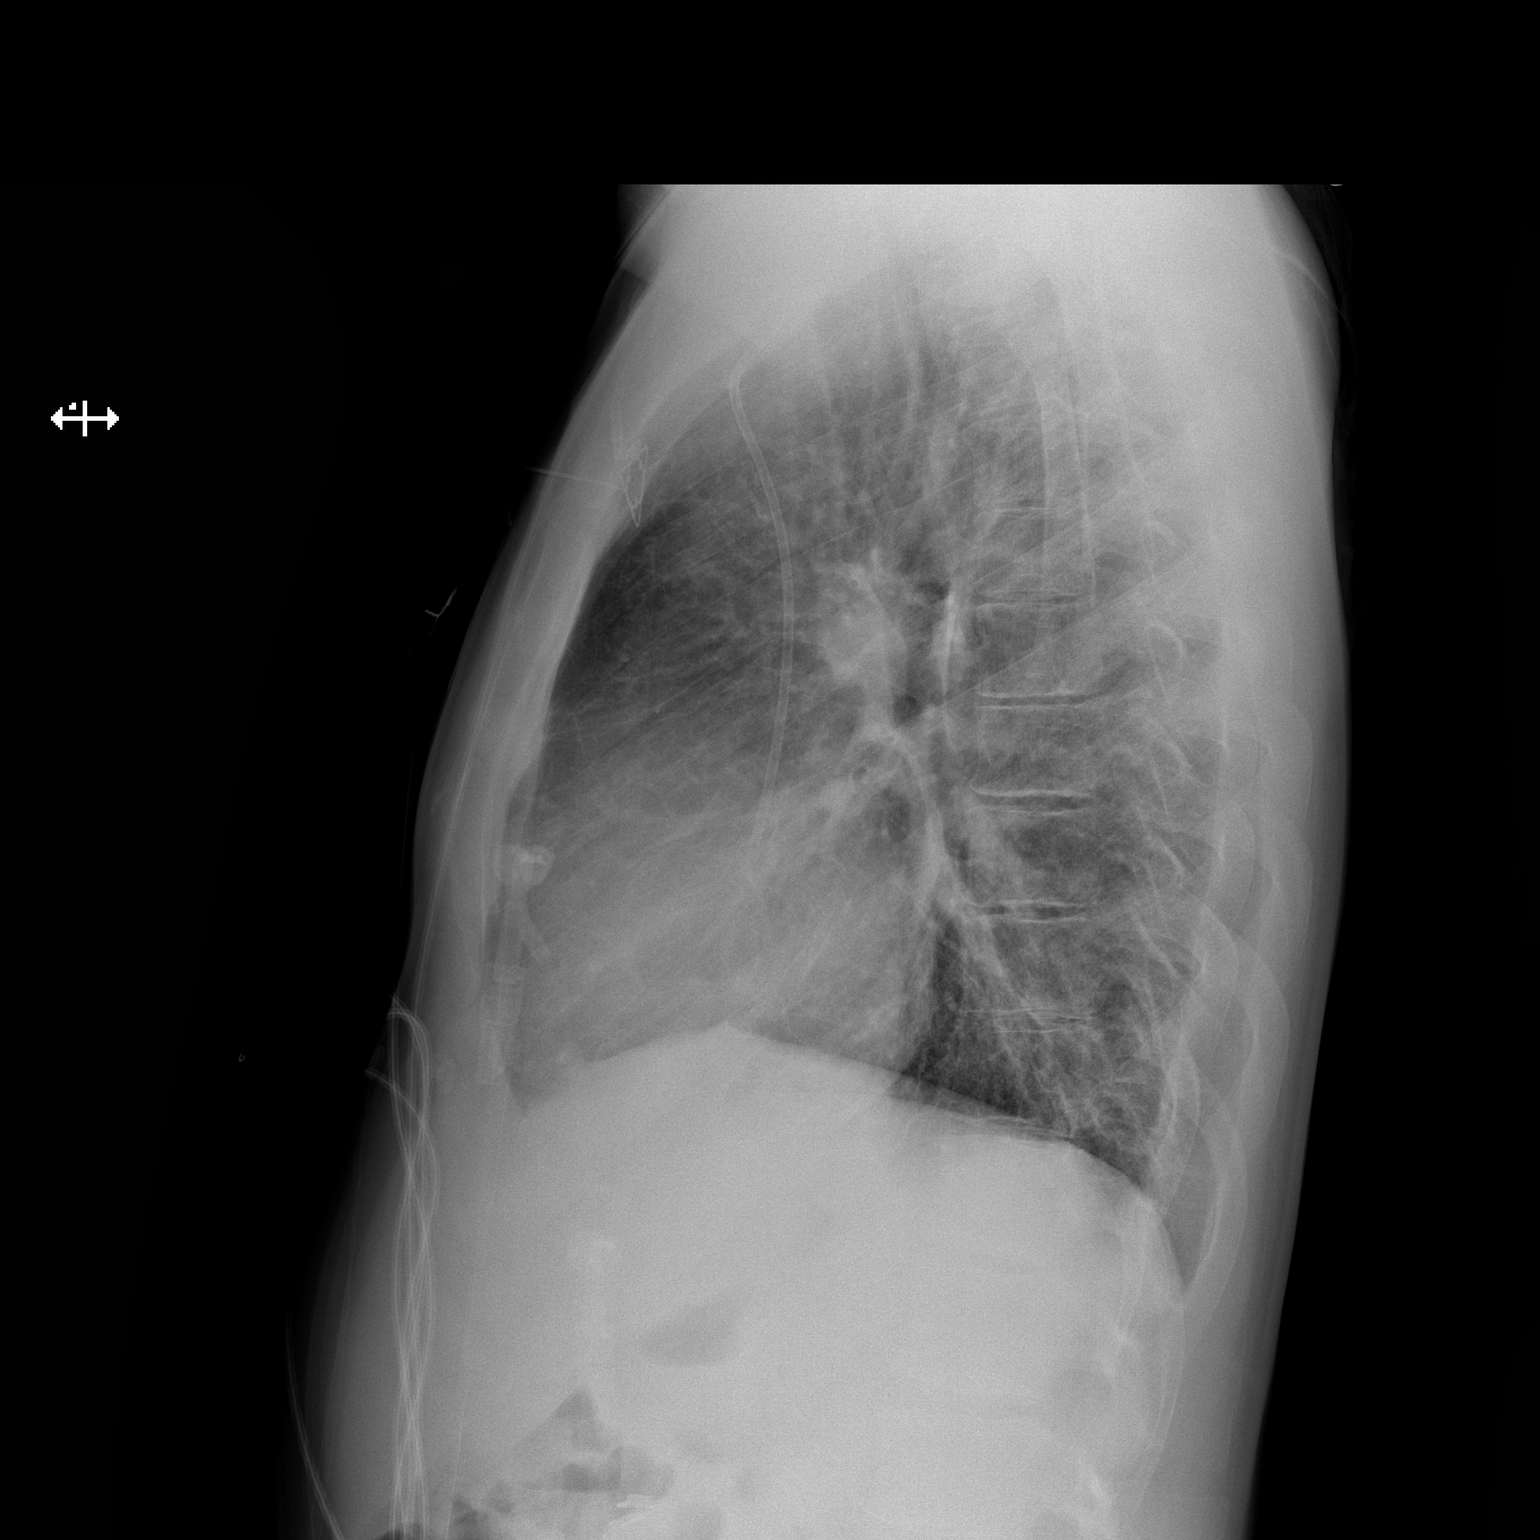

[2 of 2 positions shown; findings below may reference images not displayed]

FINDINGS: Port-A-Cath tip is at the superior vena cava - right
atrium junction.  No pneumothorax.

There is stable mild scarring in the lung bases.  Lungs are
otherwise clear.  Heart is mildly enlarged with the a mild degree
of pulmonary venous hypertension which is stable.  No adenopathy.
No bone lesions are appreciated.
IMPRESSION: There is evidence of mild cardiac enlargement with mild pulmonary
venous hypertension.  A degree of volume overload/high output state
is suspected, consistent with known sickle cell disease.  There is
no frank edema or consolidation.  Mild lung scarring is stable.

## 2015-01-23 IMAGING — CT CT ANGIO CHEST
2 of 4 series · 18 of 37 positions shown · IV contrast (OMNIPAQUE 350)
Comparison: Chest CT 11/21/2011.

CLINICAL DATA: Shortness of breath.  Evaluate for pulmonary
embolism.  History of sickle cell disease.

CT ANGIOGRAPHY CHEST
TECHNIQUE: Multidetector CT imaging of the chest using the
standard protocol during bolus administration of intravenous
contrast. Multiplanar reconstructed images including MIPs were
obtained and reviewed to evaluate the vascular anatomy.
Contrast: 100mL OMNIPAQUE IOHEXOL 350 MG/ML SOLN

[Series 6: thins for (person_name) recon · axial · 0.76mm/px · z∈[+1593,+1806]mm · 15 of 603 slices shown]
[im 36/603  lung]
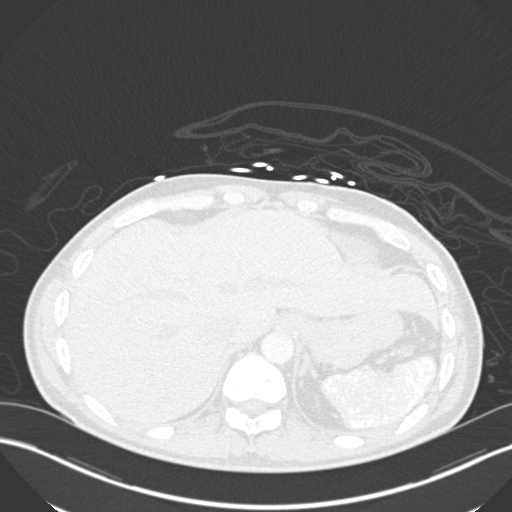
[im 71/603  mediastinal]
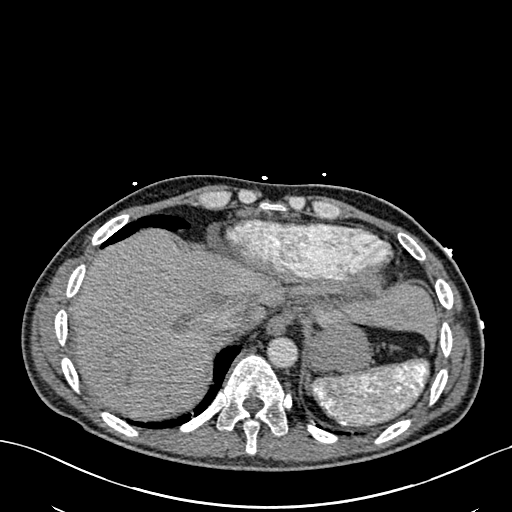
[im 107/603  lung]
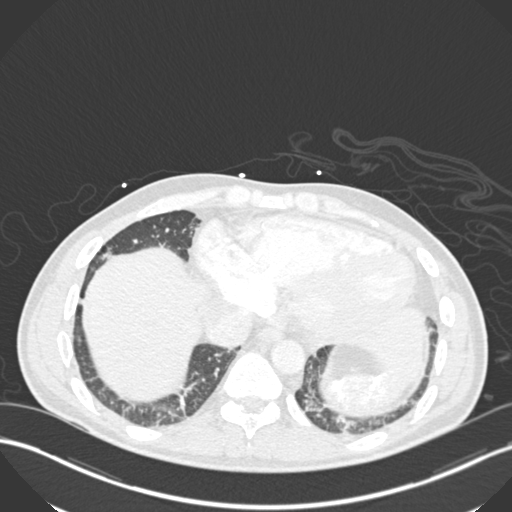
[im 142/603  mediastinal]
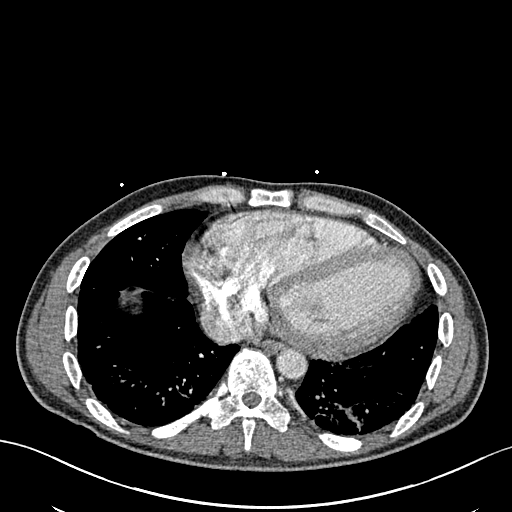
[im 213/603  lung]
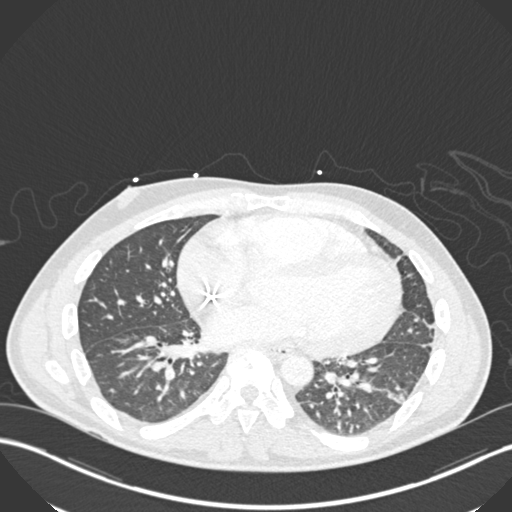
[im 248/603  mediastinal]
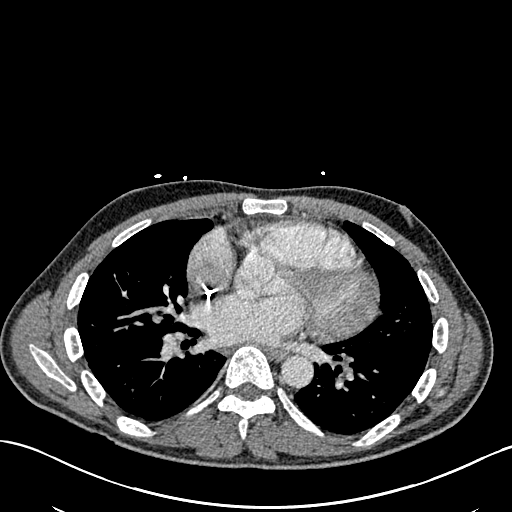
[im 284/603  lung]
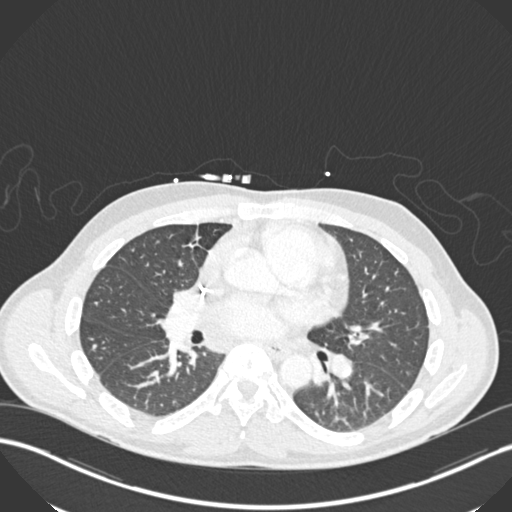
[im 319/603  mediastinal]
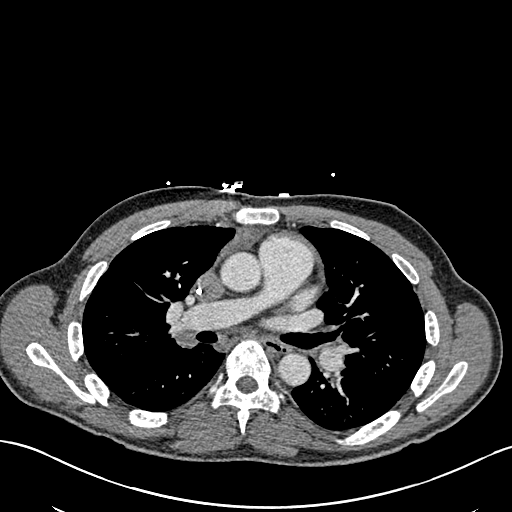
[im 355/603  lung]
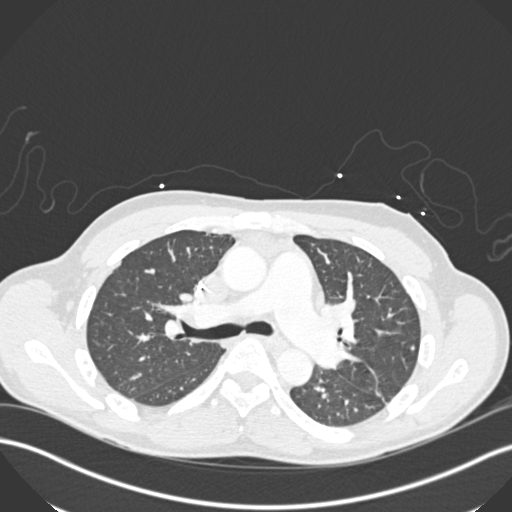
[im 369/603  mediastinal]
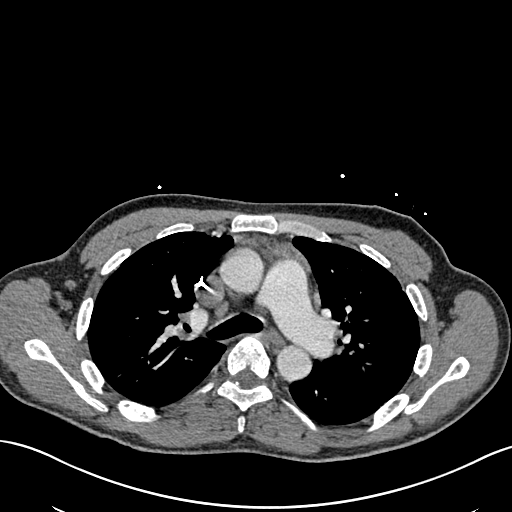
[im 390/603  lung]
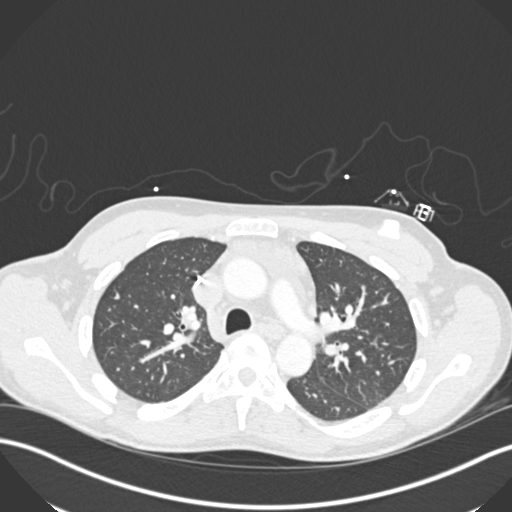
[im 461/603  mediastinal]
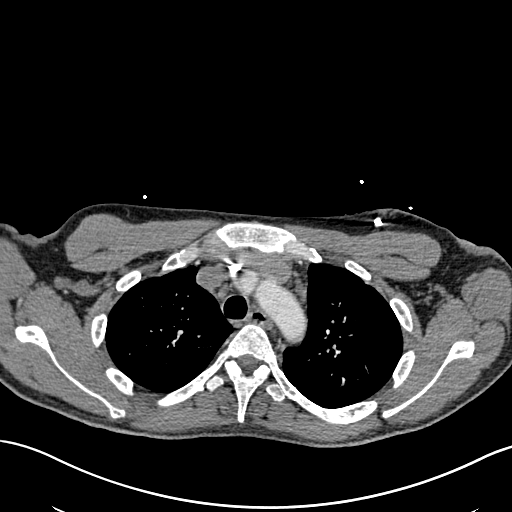
[im 496/603  lung]
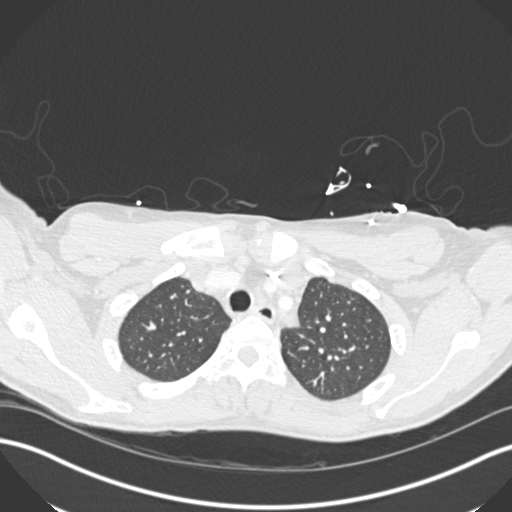
[im 532/603  mediastinal]
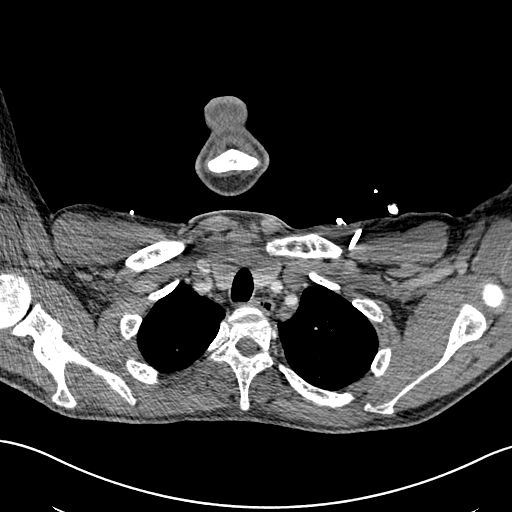
[im 567/603  lung]
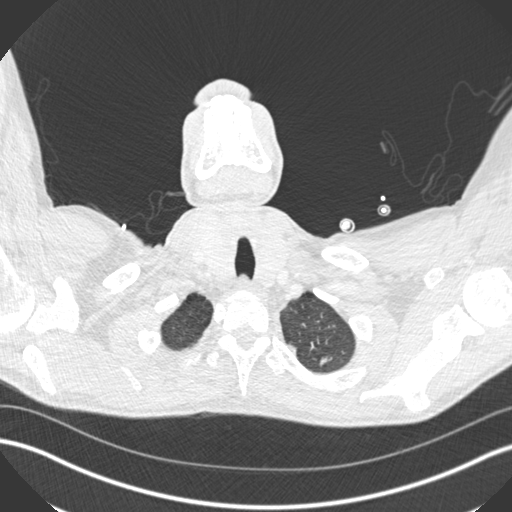

[Series 602: <mpr thick range> · coronal · 0.76mm/px · 3 of 110 slices shown]
[im 22/110  mediastinal]
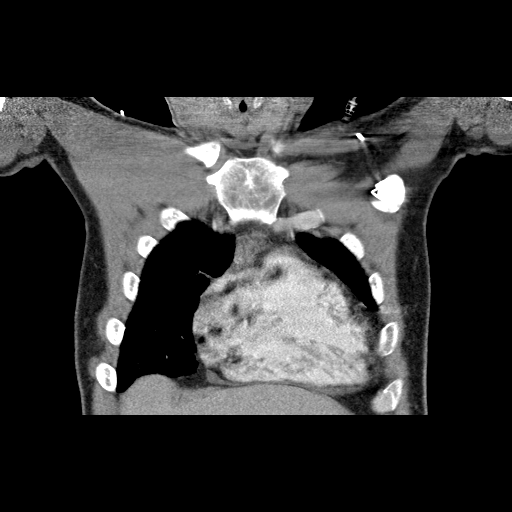
[im 44/110  mediastinal]
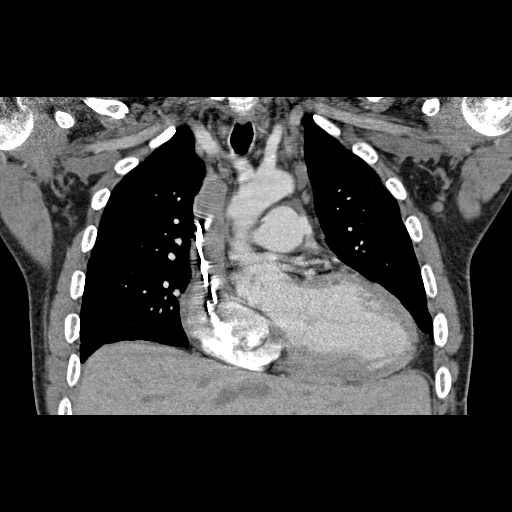
[im 66/110  mediastinal]
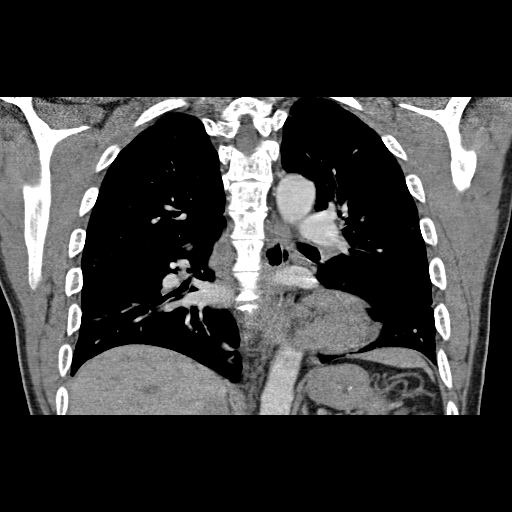

[18 of 37 positions shown; findings below may reference images not displayed]

FINDINGS: Mediastinum: There are multiple small segmental and subsegmental
sized filling defects within the pulmonary arteries to the lower
lobes of the lungs bilaterally.  No larger central or lobar sized
filling defects are noted at this time.  These emboli appear to be
nonocclusive at this time.  The heart is moderately enlarged
without definite signs of right-sided heart strain at this time.
Left subclavian single lumen power Port-A-Cath with tip terminating
in the right atrium. There is no significant pericardial fluid,
thickening or pericardial calcification.  Mildly enlarged right
hilar lymph node (1.5 cm in diameter), similar to prior.  Numerous
reactive size mediastinal and left hilar lymph nodes are noted.
Esophagus is unremarkable in appearance.

Lungs/Pleura: Extensive thickening of the peribronchovascular
interstitium and septal thickening throughout the lower lungs
bilaterally, with multiple areas of mild architectural distortion.
The overall appearance is very similar to the prior examination,
most compatible with chronic areas of scarring.  No definite
suspicious appearing pulmonary nodules or masses.  No definite
acute consolidative air space disease.  No pleural effusions.

Upper Abdomen: Heavily calcified spleen presumably related to
autosplenectomy in this patient with history of sickle cell
disease.  Cortical thinning in the posterior aspect of the upper
pole of the left kidney, suggesting scarring.

Musculoskeletal: Multifocal sclerosis in the bones related to areas
of avascular necrosis.  H shaped vertebral bodies compatible with
the diagnosis of sickle cell disease. There are no aggressive
appearing lytic or blastic lesions noted in the visualized portions
of the skeleton.
IMPRESSION: 1.  Study is positive for segmental and subsegmental sized emboli
to the lower lobes of the lungs bilaterally.

2.  Extensive chronic scarring throughout the lower lungs, similar
to prior studies.
3.  Cardiomegaly.
4.  Stigmata of sickle cell disease including changes of
autosplenectomy and bony changes, as above.
5.  Small amount scarring in the upper pole of the left kidney
likely related to prior renal infarction.

Critical Value/emergent results were called by telephone at the
time of interpretation on 03/01/2012  at [DATE] p.m. to Dr.
Balch, who verbally acknowledged these results.

## 2015-01-26 ENCOUNTER — Encounter (HOSPITAL_COMMUNITY): Payer: Self-pay | Admitting: Oncology

## 2015-01-26 ENCOUNTER — Emergency Department (HOSPITAL_COMMUNITY): Payer: Medicare Other

## 2015-01-26 ENCOUNTER — Emergency Department (HOSPITAL_COMMUNITY)
Admission: EM | Admit: 2015-01-26 | Discharge: 2015-01-26 | Disposition: A | Discharge status: Home / Self Care | Payer: Medicare Other | Attending: Emergency Medicine | Admitting: Emergency Medicine

## 2015-01-26 DIAGNOSIS — Z86718 Personal history of other venous thrombosis and embolism: Secondary | ICD-10-CM | POA: Diagnosis not present

## 2015-01-26 DIAGNOSIS — I10 Essential (primary) hypertension: Secondary | ICD-10-CM | POA: Insufficient documentation

## 2015-01-26 DIAGNOSIS — Z8619 Personal history of other infectious and parasitic diseases: Secondary | ICD-10-CM | POA: Diagnosis not present

## 2015-01-26 DIAGNOSIS — Z8739 Personal history of other diseases of the musculoskeletal system and connective tissue: Secondary | ICD-10-CM | POA: Diagnosis not present

## 2015-01-26 DIAGNOSIS — Z9119 Patient's noncompliance with other medical treatment and regimen: Secondary | ICD-10-CM | POA: Diagnosis not present

## 2015-01-26 DIAGNOSIS — R0781 Pleurodynia: Secondary | ICD-10-CM | POA: Diagnosis not present

## 2015-01-26 DIAGNOSIS — Z7982 Long term (current) use of aspirin: Secondary | ICD-10-CM | POA: Diagnosis not present

## 2015-01-26 DIAGNOSIS — Q8901 Asplenia (congenital): Secondary | ICD-10-CM | POA: Diagnosis not present

## 2015-01-26 DIAGNOSIS — Z86711 Personal history of pulmonary embolism: Secondary | ICD-10-CM | POA: Insufficient documentation

## 2015-01-26 DIAGNOSIS — Z8659 Personal history of other mental and behavioral disorders: Secondary | ICD-10-CM | POA: Diagnosis not present

## 2015-01-26 DIAGNOSIS — Z7901 Long term (current) use of anticoagulants: Secondary | ICD-10-CM | POA: Insufficient documentation

## 2015-01-26 DIAGNOSIS — Z87891 Personal history of nicotine dependence: Secondary | ICD-10-CM | POA: Diagnosis not present

## 2015-01-26 DIAGNOSIS — Z79899 Other long term (current) drug therapy: Secondary | ICD-10-CM | POA: Insufficient documentation

## 2015-01-26 DIAGNOSIS — R079 Chest pain, unspecified: Secondary | ICD-10-CM | POA: Diagnosis not present

## 2015-01-26 DIAGNOSIS — E876 Hypokalemia: Secondary | ICD-10-CM | POA: Diagnosis not present

## 2015-01-26 DIAGNOSIS — D5700 Hb-SS disease with crisis, unspecified: Secondary | ICD-10-CM | POA: Diagnosis present

## 2015-01-26 LAB — CBC WITH DIFFERENTIAL/PLATELET
BASOS ABS: 0 10*3/uL (ref 0.0–0.1)
BASOS PCT: 0 %
EOS PCT: 1 %
Eosinophils Absolute: 0.2 10*3/uL (ref 0.0–0.7)
HEMATOCRIT: 21.3 % — AB (ref 39.0–52.0)
HEMOGLOBIN: 7.4 g/dL — AB (ref 13.0–17.0)
LYMPHS ABS: 3.2 10*3/uL (ref 0.7–4.0)
Lymphocytes Relative: 15 %
MCH: 30.1 pg (ref 26.0–34.0)
MCHC: 34.7 g/dL (ref 30.0–36.0)
MCV: 86.6 fL (ref 78.0–100.0)
MONOS PCT: 14 %
Monocytes Absolute: 3 10*3/uL — ABNORMAL HIGH (ref 0.1–1.0)
NEUTROS ABS: 14.9 10*3/uL — AB (ref 1.7–7.7)
Neutrophils Relative %: 70 %
Platelets: 499 10*3/uL — ABNORMAL HIGH (ref 150–400)
RBC: 2.46 MIL/uL — ABNORMAL LOW (ref 4.22–5.81)
RDW: 20.7 % — ABNORMAL HIGH (ref 11.5–15.5)
WBC: 21.3 10*3/uL — AB (ref 4.0–10.5)

## 2015-01-26 LAB — COMPREHENSIVE METABOLIC PANEL
ALBUMIN: 4.2 g/dL (ref 3.5–5.0)
ALT: 21 U/L (ref 17–63)
AST: 38 U/L (ref 15–41)
Alkaline Phosphatase: 70 U/L (ref 38–126)
Anion gap: 7 (ref 5–15)
BUN: 12 mg/dL (ref 6–20)
CHLORIDE: 111 mmol/L (ref 101–111)
CO2: 23 mmol/L (ref 22–32)
Calcium: 8.8 mg/dL — ABNORMAL LOW (ref 8.9–10.3)
Creatinine, Ser: 0.56 mg/dL — ABNORMAL LOW (ref 0.61–1.24)
GFR calc Af Amer: >60 mL/min (ref >60)
GLUCOSE: 98 mg/dL (ref 65–99)
POTASSIUM: 3.5 mmol/L (ref 3.5–5.1)
SODIUM: 141 mmol/L (ref 135–145)
Total Bilirubin: 6.5 mg/dL — ABNORMAL HIGH (ref 0.3–1.2)
Total Protein: 7.3 g/dL (ref 6.5–8.1)

## 2015-01-26 LAB — RETICULOCYTES
RBC.: 2.6 MIL/uL — ABNORMAL LOW (ref 4.22–5.81)
RETIC CT PCT: 12.2 % — AB (ref 0.4–3.1)
Retic Count, Absolute: 317.2 10*3/uL — ABNORMAL HIGH (ref 19.0–186.0)

## 2015-01-26 MED ORDER — KETOROLAC TROMETHAMINE 30 MG/ML IJ SOLN
30.0000 mg | INTRAMUSCULAR | Status: AC
  Administered 2015-01-26: 30 mg via INTRAVENOUS
  Filled 2015-01-26: qty 1

## 2015-01-26 MED ORDER — DIPHENHYDRAMINE HCL 25 MG PO CAPS
25.0000 mg | ORAL_CAPSULE | ORAL | Status: DC | PRN
  Administered 2015-01-26: 25 mg via ORAL
  Filled 2015-01-26: qty 1

## 2015-01-26 MED ORDER — DEXTROSE-NACL 5-0.45 % IV SOLN
INTRAVENOUS | Status: DC
Start: 2015-01-26 — End: 2015-01-26
  Administered 2015-01-26: 02:00:00 via INTRAVENOUS

## 2015-01-26 MED ORDER — HYDROMORPHONE HCL 2 MG/ML IJ SOLN: 0.0250 mg/kg | INTRAMUSCULAR | Status: AC

## 2015-01-26 MED ORDER — HYDROMORPHONE HCL 2 MG/ML IJ SOLN
0.0250 mg/kg | INTRAMUSCULAR | Status: AC
  Administered 2015-01-26: 1.9 mg via INTRAVENOUS
  Filled 2015-01-26: qty 1

## 2015-01-26 MED ORDER — HYDROMORPHONE HCL 2 MG/ML IJ SOLN
0.0313 mg/kg | INTRAMUSCULAR | Status: AC
  Administered 2015-01-26: 2.3 mg via INTRAVENOUS
  Filled 2015-01-26: qty 2

## 2015-01-26 MED ORDER — HYDROMORPHONE HCL 2 MG/ML IJ SOLN: 0.0313 mg/kg | INTRAMUSCULAR | Status: AC

## 2015-01-26 MED ORDER — HEPARIN SOD (PORK) LOCK FLUSH 100 UNIT/ML IV SOLN
500.0000 [IU] | Freq: Once | INTRAVENOUS | Status: AC
  Administered 2015-01-26: 500 [IU]
  Filled 2015-01-26: qty 5

## 2015-01-26 NOTE — ED Notes (Signed)
Patient transported to X-ray 

## 2015-01-26 NOTE — ED Provider Notes (Signed)
CSN: [id number]     Arrival date & time 01/26/15  0020 History   First MD Initiated Contact with Patient 01/26/15 [phone]     Chief Complaint  Patient presents with  . Sickle Cell Pain Crisis     (Consider location/radiation/quality/duration/timing/severity/associated sxs/prior Treatment) HPI Comments: [name] is a 34 y.o. male with a PMHx of sickle cell anemia, remote PE on chronic lovenox, AVN R hip, chronic leukocytosis, chronic thrombocytosis, HTN, marijuana use, alcohol use, and prior acute chest syndrome as well as multiple medical conditions noted below, who presents to the ED with complaints of recurrent sickle cell crisis pain. He describes the pain as being in both sides of his lateral rib cage as well as both legs, which is the same areas as his typical sickle cell crisis pain. He describes the pain is 9/10 throbbing constant nonradiating pain with no aggravating factors and mildly improved with Dilaudid 4 mg q4h and MS contin 30mg  q12h PRN. He has been seen more than 20 times in the last several months for similar symptoms.  He denies any fevers or chills, URI symptoms, chest pain, shortness of breath, cough, leg swelling, leg ulcers, abdominal pain, nausea, vomiting, diarrhea, constipation, dysuria, hematuria, numbness, tingling, or weakness. No recent travel, elevation changes, or sick contacts.  Patient is a 34 y.o. male presenting with sickle cell pain. The history is provided by the patient and medical records. No language interpreter was used.  Sickle Cell Pain Crisis Location:  Lower extremity, R side and L side (b/l lateral rib cage and b/l lower extremities) Severity:  Moderate Onset quality:  Gradual Duration:  2 days Similar to previous crisis episodes: yes   Timing:  Constant Progression:  Unchanged Chronicity:  Recurrent Frequency of attacks:  Often History of pulmonary emboli: yes   Relieved by:  Prescription drugs Worsened by:  Nothing tried Ineffective  treatments:  None tried Associated symptoms: no chest pain, no congestion, no cough, no fever, no leg ulcers, no nausea, no priapism, no shortness of breath, no sore throat, no swelling of legs, no vomiting and no wheezing   Risk factors: frequent admissions for pain, frequent pain crises and prior acute chest   Risk factors: no elevation change, no exertion and no recent air travel     Past Medical History  Diagnosis Date  . Sickle cell anemia (HCC)   . Blood transfusion   . Acute embolism and thrombosis of right internal jugular vein ([place])   . Hypokalemia   . Mood disorder ([place])   . History of pulmonary embolus (PE)   . Avascular necrosis (HCC)     Right Hip  . Leukocytosis     Chronic  . Thrombocytosis (HCC)     Chronic  . Hypertension   . History of Clostridium difficile infection   . Uses marijuana   . Chronic anticoagulation   . Functional asplenia   . Former smoker   . Second hand tobacco smoke exposure   . Alcohol consumption of one to four drinks per day   . Noncompliance with medication regimen   . Sickle-cell crisis with associated acute chest syndrome ([place]) 05/13/2013  . Acute chest syndrome ([place]) 06/18/2013  . Demand ischemia ([place]) 01/02/2014  . Pulmonary hypertension [facility])    Past Surgical History  Procedure Laterality Date  . Right hip replacement      08/2006  . Cholecystectomy      01/2008  . Porta cath placement    . [place] cath  removal    . Umbilical hernia repair      01/2008  . Excision of left periauricular cyst      10/2009  . Excision of right ear lobe cyst with primary closur      11/2007  . Portacath placement  01/05/2012    Procedure: INSERTION PORT-A-CATH;  Surgeon: Odis Hollingshead, MD;  Location: [place];  Service: General;  Laterality: N/A;  ultrasound guiced port a cath insertion with fluoroscopy   Family History  Problem Relation Age of Onset  . Sickle cell trait Mother   . Depression Mother   . Diabetes Mother   . Sickle cell trait  Father   . Sickle cell trait Brother    Social History  Substance Use Topics  . Smoking status: Former Smoker -- 13 years  . Smokeless tobacco: Never Used  . Alcohol Use: No    Review of Systems  Constitutional: Negative for fever and chills.  HENT: Negative for congestion, ear discharge, ear pain, rhinorrhea and sore throat.   Respiratory: Negative for cough, shortness of breath and wheezing.   Cardiovascular: Negative for chest pain and leg swelling.  Gastrointestinal: Negative for nausea, vomiting, abdominal pain, diarrhea and constipation.  Genitourinary: Negative for dysuria and hematuria.  Musculoskeletal: Positive for arthralgias (b/l legs and ribcage). Negative for myalgias.  Skin: Negative for color change and wound.  Allergic/Immunologic: Negative for immunocompromised state.  Neurological: Negative for weakness and numbness.  Psychiatric/Behavioral: Negative for confusion.   10 Systems reviewed and are negative for acute change except as noted in the HPI.    Allergies  Review of patient's allergies indicates no known allergies.  Home Medications   Prior to Admission medications   Medication Sig Start Date End Date Taking? Authorizing Provider  aspirin 81 MG chewable tablet Chew 1 tablet (81 mg total) by mouth daily. 07/23/14   [name], MD  Cholecalciferol (VITAMIN D) 2000 UNITS tablet Take 1 tablet (2,000 Units total) by mouth daily. 02/13/14   [name], MD  enoxaparin (LOVENOX) 120 MG/0.8ML injection Inject 0.8 mLs (120 mg total) into the skin daily. 12/03/14   [name], MD  folic acid (FOLVITE) 1 MG tablet Take 1 tablet (1 mg total) by mouth every morning. 10/08/13   [name], MD  HYDROmorphone (DILAUDID) 4 MG tablet Take 1 tablet (4 mg total) by mouth every 4 (four) hours as needed for severe pain. 12/23/14   Dorena Dew, FNP  hydroxyurea (HYDREA) 500 MG capsule Take 1 capsule (500 mg total) by mouth daily. May take with  food to minimize GI side effects. 11/06/14   Dorena Dew, FNP  lisinopril (PRINIVIL,ZESTRIL) 10 MG tablet Take 1 tablet (10 mg total) by mouth daily. 01/22/14   [name], MD  metoprolol succinate (TOPROL-XL) 50 MG 24 hr tablet Take 1 tablet (50 mg total) by mouth daily. 11/28/14   [name], MD  morphine (MS CONTIN) 30 MG 12 hr tablet Take 1 tablet (30 mg total) by mouth every 12 (twelve) hours. 12/23/14   Dorena Dew, FNP  potassium chloride SA (K-DUR,KLOR-CON) 20 MEQ tablet Take 1 tablet (20 mEq total) by mouth every morning. 06/09/14   [name], MD  zolpidem (AMBIEN) 10 MG tablet Take 1 tablet (10 mg total) by mouth at bedtime as needed for sleep. 01/13/15   Dorena Dew, FNP   BP 109/85 mmHg  Pulse 88  Temp(Src) 98.5 F (36.9 C) (Oral)  Resp 14  Ht 6' (1.829 m)  Wt 74.844 kg  BMI 22.37 kg/m2  SpO2 93% Physical Exam  Constitutional: He is oriented to person, place, and time. Vital signs are normal. He appears well-developed and well-nourished.  Non-toxic appearance. No distress.  Afebrile, nontoxic, NAD  HENT:  Head: Normocephalic and atraumatic.  Mouth/Throat: Oropharynx is clear and moist and mucous membranes are normal.  Eyes: Conjunctivae and EOM are normal. Right eye exhibits no discharge. Left eye exhibits no discharge.  Neck: Normal range of motion. Neck supple.  Cardiovascular: Normal rate, regular rhythm, normal heart sounds and intact distal pulses.  Exam reveals no gallop and no friction rub.   No murmur heard. RRR, nl s1/s2, no m/r/g, distal pulses intact, no pedal edema   Pulmonary/Chest: Effort normal and breath sounds normal. No respiratory distress. He has no decreased breath sounds. He has no wheezes. He has no rhonchi. He has no rales. He exhibits no tenderness, no crepitus, no deformity and no retraction.  CTAB in all lung fields, no w/r/r, no hypoxia or increased WOB, speaking in full sentences, SpO2 93-95% on RA  No chest  wall TTP, no crepitus or deformities, no retractions  Abdominal: Soft. Normal appearance and bowel sounds are normal. He exhibits no distension. There is no tenderness. There is no rigidity, no rebound, no guarding, no CVA tenderness, no tenderness at McBurney's point and negative Murphy's sign.  Musculoskeletal: Normal range of motion.  MAE x4 Strength and sensation grossly intact Distal pulses intact No pedal edema, neg homan's bilaterally  Gait steady and nonantalgic No focal areas of tenderness in all extremities  Neurological: He is alert and oriented to person, place, and time. He has normal strength. No sensory deficit.  Skin: Skin is warm, dry and intact. No rash noted.  Psychiatric: He has a normal mood and affect.  Nursing note and vitals reviewed.   ED Course  Procedures (including critical care time) Labs Review Labs Reviewed  COMPREHENSIVE METABOLIC PANEL - Abnormal; Notable for the following:    Creatinine, Ser 0.56 (*)    Calcium 8.8 (*)    Total Bilirubin 6.5 (*)    All other components within normal limits  CBC WITH DIFFERENTIAL/PLATELET - Abnormal; Notable for the following:    WBC 21.3 (*)    RBC 2.46 (*)    Hemoglobin 7.4 (*)    HCT 21.3 (*)    RDW 20.7 (*)    Platelets 499 (*)    Neutro Abs 14.9 (*)    Monocytes Absolute 3.0 (*)    All other components within normal limits  RETICULOCYTES - Abnormal; Notable for the following:    Retic Ct Pct 12.2 (*)    RBC. 2.60 (*)    Retic Count, Manual 317.2 (*)    All other components within normal limits    Imaging Review Dg Chest 2 View  01/26/2015  CLINICAL DATA:  Acute onset of bilateral generalized chest pain. Initial encounter. EXAM: CHEST  2 VIEW COMPARISON:  Chest radiograph from 01/14/2015 FINDINGS: The lungs are well-aerated. Peribronchial thickening is noted. There is no evidence of focal opacification, pleural effusion or pneumothorax. The heart is enlarged. A left-sided chest port is noted ending  about the distal SVC. No acute osseous abnormalities are seen. Clips are noted within the right upper quadrant, reflecting prior cholecystectomy. IMPRESSION: Peribronchial thickening noted. Lungs otherwise grossly clear. Cardiomegaly noted. Electronically Signed   By: Garald Balding M.D.   On: 01/26/2015 01:30   I have  personally reviewed and evaluated these images and lab results as part of my medical decision-making.   EKG Interpretation None      MDM   Final diagnoses:  Sickle cell pain crisis (HCC)  Rib pain    34 y.o. male here with sickle cell pain crisis in b/l ribs and legs. Same as prior sickle cell crises in the past. No focal tenderness anywhere. No hypoxia, no cough, no tachycardia, doubt PE or acute chest syndrome. Denies CP, just states his ribs hurt on the sides. Appears comfortable. Will give one round of pain meds and reassess. Pt with >20 ER visits in the last several months, multiple admissions for pain. Will get labs and reassess shortly.   4:01 AM CMP WNL aside from baseline elevated bilirubin. CBC w/diff showing baseline WBC, improved H/H from baseline, chronic thrombocytosis. Retics near baseline. CXR clear without evidence of acute chest syndrome. Pt feeling improved after 2 rounds of dilaudid, 30mg  toradol, and benadryl with fluids. Pt feels okay to go home. Will have him use his home medications and f/up with sickle cell clinic in the next 1-2 days. I explained the diagnosis and have given explicit precautions to return to the ER including for any other new or worsening symptoms. The patient understands and accepts the medical plan as it's been dictated and I have answered their questions. Discharge instructions concerning home care and prescriptions have been given. The patient is STABLE and is discharged to home in good condition.  BP 100/76 mmHg  Pulse 81  Temp(Src) 98.5 F (36.9 C) (Oral)  Resp 22  Ht 6' (1.829 m)  Wt 74.844 kg  BMI 22.37 kg/m2  SpO2  92%  Meds ordered this encounter  Medications  . HYDROmorphone (DILAUDID) injection 1.9 mg    Sig:    Or  . HYDROmorphone (DILAUDID) injection 1.9 mg    Sig:   . dextrose 5 %-0.45 % sodium chloride infusion    Sig:   . diphenhydrAMINE (BENADRYL) capsule 25-50 mg    Sig:   . ketorolac (TORADOL) 30 MG/ML injection 30 mg    Sig:   . HYDROmorphone (DILAUDID) injection 2.3 mg    Sig:    Or  . HYDROmorphone (DILAUDID) injection 2.3 mg    Sig:   . heparin lock flush 100 unit/mL    Sig:      Mysha Peeler Camprubi-Soms, PA-C 01/26/15 [place], DO 01/26/15 [id number]

## 2015-01-26 NOTE — ED Notes (Signed)
Pt presents d/t typical sickle cell pain.  Taken home meds w/o relief.  C/o pain in legs and ribcage.  Rates pain 9/10.

## 2015-01-26 NOTE — Discharge Instructions (Signed)
Stay well hydrated. Use your home pain medications as needed for pain. Follow up with the sickle cell clinic tomorrow for ongoing management of your sickle cell disease. Return to the ER for changes or worsening symptoms.   Sickle Cell Anemia, Adult Sickle cell anemia is a condition where your red blood cells are shaped like sickles. Red blood cells carry oxygen through the body. Sickle-shaped red blood cells do not live as long as normal red blood cells. They also clump together and block blood from flowing through the blood vessels. These things prevent the body from getting enough oxygen. Sickle cell anemia causes organ damage and pain. It also increases the risk of infection. HOME CARE  Drink enough fluid to keep your pee (urine) clear or pale yellow. Drink more in hot weather and during exercise.  Do not smoke. Smoking lowers oxygen levels in the blood.  Only take over-the-counter or prescription medicines as told by your doctor.  Take antibiotic medicines as told by your doctor. Make sure you finish them even if you start to feel better.  Take supplements as told by your doctor.  Consider wearing a medical alert bracelet. This tells anyone caring for you in an emergency of your condition.  When traveling, keep your medical information, doctors' names, and the medicines you take with you at all times.  If you have a fever, do not take fever medicines right away. This could cover up a problem. Tell your doctor.   Keep all follow-up visits with your doctor. Sickle cell anemia requires regular medical care. GET HELP IF: You have a fever. GET HELP RIGHT AWAY IF:  You feel dizzy or faint.  You have new belly (abdominal) pain, especially on the left side near the stomach area.  You have a lasting, often uncomfortable and painful erection of the penis (priapism). If it is not treated right away, you will become unable to have sex (impotence).  You have numbness in your arms or legs or  you have a hard time moving them.  You have a hard time talking.  You have a fever or lasting symptoms for more than 2-3 days.  You have a fever and your symptoms suddenly get worse.  You have signs or symptoms of infection. These include:  Chills.  Being more tired than normal (lethargy).  Irritability.  Poor eating.  Throwing up (vomiting).  You have pain that is not helped with medicine.  You have shortness of breath.  You have pain in your chest.  You are coughing up pus-like or bloody mucus.  You have a stiff neck.  Your feet or hands swell or have pain.  Your belly looks bloated.  Your joints hurt. MAKE SURE YOU:  Understand these instructions.  Will watch your condition.  Will get help right away if you are not doing well or get worse.   This information is not intended to replace advice given to you by your health care provider. Make sure you discuss any questions you have with your health care provider.   Document Released: 11/07/2012 Document Revised: 06/03/2014 Document Reviewed: 11/07/2012 Elsevier Interactive Patient Education [employer].

## 2015-01-27 ENCOUNTER — Telehealth (HOSPITAL_COMMUNITY): Payer: Self-pay | Admitting: Hematology

## 2015-01-27 ENCOUNTER — Encounter (HOSPITAL_COMMUNITY): Payer: Self-pay | Admitting: *Deleted

## 2015-01-27 ENCOUNTER — Telehealth (HOSPITAL_COMMUNITY): Payer: Self-pay | Admitting: *Deleted

## 2015-01-27 ENCOUNTER — Emergency Department (HOSPITAL_COMMUNITY)
Admission: EM | Admit: 2015-01-27 | Discharge: 2015-01-27 | Disposition: A | Discharge status: Home / Self Care | Payer: Medicare Other | Source: Home / Self Care | Attending: Emergency Medicine | Admitting: Emergency Medicine

## 2015-01-27 ENCOUNTER — Non-Acute Institutional Stay (HOSPITAL_COMMUNITY)
Admission: AD | Admit: 2015-01-27 | Discharge: 2015-01-27 | Disposition: A | Discharge status: Home / Self Care | Payer: Medicare Other | Source: Ambulatory Visit | Attending: Internal Medicine | Admitting: Internal Medicine

## 2015-01-27 DIAGNOSIS — I10 Essential (primary) hypertension: Secondary | ICD-10-CM | POA: Diagnosis not present

## 2015-01-27 DIAGNOSIS — Z7901 Long term (current) use of anticoagulants: Secondary | ICD-10-CM | POA: Diagnosis not present

## 2015-01-27 DIAGNOSIS — Z9981 Dependence on supplemental oxygen: Secondary | ICD-10-CM | POA: Insufficient documentation

## 2015-01-27 DIAGNOSIS — R17 Unspecified jaundice: Secondary | ICD-10-CM | POA: Diagnosis not present

## 2015-01-27 DIAGNOSIS — D5700 Hb-SS disease with crisis, unspecified: Secondary | ICD-10-CM

## 2015-01-27 DIAGNOSIS — Z86711 Personal history of pulmonary embolism: Secondary | ICD-10-CM | POA: Insufficient documentation

## 2015-01-27 DIAGNOSIS — Z79891 Long term (current) use of opiate analgesic: Secondary | ICD-10-CM | POA: Insufficient documentation

## 2015-01-27 DIAGNOSIS — Z96641 Presence of right artificial hip joint: Secondary | ICD-10-CM | POA: Diagnosis not present

## 2015-01-27 DIAGNOSIS — Z79899 Other long term (current) drug therapy: Secondary | ICD-10-CM | POA: Diagnosis not present

## 2015-01-27 DIAGNOSIS — Z7982 Long term (current) use of aspirin: Secondary | ICD-10-CM | POA: Insufficient documentation

## 2015-01-27 DIAGNOSIS — Z86718 Personal history of other venous thrombosis and embolism: Secondary | ICD-10-CM | POA: Diagnosis not present

## 2015-01-27 DIAGNOSIS — Z87891 Personal history of nicotine dependence: Secondary | ICD-10-CM | POA: Insufficient documentation

## 2015-01-27 DIAGNOSIS — R0781 Pleurodynia: Secondary | ICD-10-CM | POA: Diagnosis present

## 2015-01-27 LAB — COMPREHENSIVE METABOLIC PANEL
ALT: 19 U/L (ref 17–63)
ANION GAP: 8 (ref 5–15)
AST: 37 U/L (ref 15–41)
Albumin: 4 g/dL (ref 3.5–5.0)
Alkaline Phosphatase: 85 U/L (ref 38–126)
BILIRUBIN TOTAL: 4.7 mg/dL — AB (ref 0.3–1.2)
BUN: 14 mg/dL (ref 6–20)
CALCIUM: 8.7 mg/dL — AB (ref 8.9–10.3)
CHLORIDE: 109 mmol/L (ref 101–111)
CO2: 21 mmol/L — ABNORMAL LOW (ref 22–32)
Creatinine, Ser: 0.74 mg/dL (ref 0.61–1.24)
Glucose, Bld: 130 mg/dL — ABNORMAL HIGH (ref 65–99)
POTASSIUM: 3.9 mmol/L (ref 3.5–5.1)
Sodium: 138 mmol/L (ref 135–145)
Total Protein: 7.2 g/dL (ref 6.5–8.1)

## 2015-01-27 LAB — CBC WITH DIFFERENTIAL/PLATELET
BASOS PCT: 1 %
Basophils Absolute: 0.2 10*3/uL — ABNORMAL HIGH (ref 0.0–0.1)
EOS ABS: 0.7 10*3/uL (ref 0.0–0.7)
Eosinophils Relative: 4 %
HCT: 20.1 % — ABNORMAL LOW (ref 39.0–52.0)
Hemoglobin: 7 g/dL — ABNORMAL LOW (ref 13.0–17.0)
LYMPHS ABS: 2.8 10*3/uL (ref 0.7–4.0)
Lymphocytes Relative: 17 %
MCH: 29.9 pg (ref 26.0–34.0)
MCHC: 34.8 g/dL (ref 30.0–36.0)
MCV: 85.9 fL (ref 78.0–100.0)
MONO ABS: 2.3 10*3/uL — AB (ref 0.1–1.0)
Monocytes Relative: 14 %
NEUTROS ABS: 10.6 10*3/uL — AB (ref 1.7–7.7)
Neutrophils Relative %: 64 %
PLATELETS: 454 10*3/uL — AB (ref 150–400)
RBC: 2.34 MIL/uL — ABNORMAL LOW (ref 4.22–5.81)
RDW: 20.1 % — AB (ref 11.5–15.5)
WBC: 16.6 10*3/uL — ABNORMAL HIGH (ref 4.0–10.5)

## 2015-01-27 MED ORDER — DIPHENHYDRAMINE HCL 12.5 MG/5ML PO ELIX: 12.5000 mg | ORAL_SOLUTION | Freq: Four times a day (QID) | ORAL | Status: DC | PRN

## 2015-01-27 MED ORDER — SODIUM CHLORIDE 0.9 % IJ SOLN
10.0000 mL | INTRAMUSCULAR | Status: AC | PRN
  Administered 2015-01-27: 10 mL

## 2015-01-27 MED ORDER — SODIUM CHLORIDE 0.9 % IV SOLN
12.5000 mg | Freq: Four times a day (QID) | INTRAVENOUS | Status: DC | PRN
  Administered 2015-01-27: 12.5 mg via INTRAVENOUS
  Filled 2015-01-27 (×2): qty 0.25

## 2015-01-27 MED ORDER — KETOROLAC TROMETHAMINE 30 MG/ML IJ SOLN
30.0000 mg | Freq: Once | INTRAMUSCULAR | Status: AC
  Administered 2015-01-27: 30 mg via INTRAVENOUS
  Filled 2015-01-27: qty 1

## 2015-01-27 MED ORDER — HEPARIN SOD (PORK) LOCK FLUSH 100 UNIT/ML IV SOLN
500.0000 [IU] | Freq: Once | INTRAVENOUS | Status: AC
  Administered 2015-01-27: 500 [IU]
  Filled 2015-01-27: qty 5

## 2015-01-27 MED ORDER — ONDANSETRON HCL 4 MG/2ML IJ SOLN: 4.0000 mg | Freq: Four times a day (QID) | INTRAMUSCULAR | Status: DC | PRN

## 2015-01-27 MED ORDER — HEPARIN SOD (PORK) LOCK FLUSH 100 UNIT/ML IV SOLN
500.0000 [IU] | INTRAVENOUS | Status: AC | PRN
  Administered 2015-01-27: 500 [IU]
  Filled 2015-01-27: qty 5

## 2015-01-27 MED ORDER — NALOXONE HCL 0.4 MG/ML IJ SOLN: 0.4000 mg | INTRAMUSCULAR | Status: DC | PRN

## 2015-01-27 MED ORDER — HYDROMORPHONE HCL 2 MG/ML IJ SOLN
2.0000 mg | Freq: Once | INTRAMUSCULAR | Status: AC
  Administered 2015-01-27: 2 mg via INTRAVENOUS
  Filled 2015-01-27: qty 1

## 2015-01-27 MED ORDER — SODIUM CHLORIDE 0.9 % IJ SOLN: 9.0000 mL | INTRAMUSCULAR | Status: DC | PRN

## 2015-01-27 MED ORDER — DEXTROSE-NACL 5-0.45 % IV SOLN
INTRAVENOUS | Status: DC
  Administered 2015-01-27: 09:00:00 via INTRAVENOUS

## 2015-01-27 MED ORDER — HYDROMORPHONE 1 MG/ML IV SOLN
INTRAVENOUS | Status: DC
  Administered 2015-01-27: 21 mg via INTRAVENOUS
  Administered 2015-01-27: 10:00:00 via INTRAVENOUS
  Filled 2015-01-27: qty 25

## 2015-01-27 MED ORDER — DEXTROSE-NACL 5-0.45 % IV SOLN
INTRAVENOUS | Status: DC
  Administered 2015-01-27: 04:00:00 via INTRAVENOUS

## 2015-01-27 MED ORDER — DIPHENHYDRAMINE HCL 50 MG/ML IJ SOLN
25.0000 mg | Freq: Once | INTRAMUSCULAR | Status: AC
  Administered 2015-01-27: 25 mg via INTRAVENOUS
  Filled 2015-01-27: qty 1

## 2015-01-27 NOTE — H&P (Signed)
[facility] History and Physical   Date: 01/27/2015  Patient name: [name] Medical record number: [id number] Date of birth: August 08, 1980 Age: 34 y.o. Gender: male PCP: [name], MD  Attending physician: Tresa Garter, MD  Chief Complaint: Bilateral Rib Pain  History of Present Illness: [name] is a 34 year old male with a history of sickle cell anemia,HbSS who presents to the day infusion center for pain related to sickle cell anemia. Patient was recently evaluated in the emergency department on 01/26/2015 for left side pain. Pain is consistent with sickle cell anemia. He states that pain intensity started increasing several hours post emergency room discharge. Patient is on 3 liters of oxygen at home. He has not been wearing oxygen consistently. He did not come with his home oxygen tank on today. [name] is also on chronic anticoagulation therapy due to history of pulmonary embolism. He states that he is taking medication consistently. Current pain intensity is described as 8/10, constant and throbbing primarily to left side.  He las had Dilaudid 4 mg this am with minimal relief.  He denies any fever, chills, chest pain, shortness of breath, cough, abdominal pain, nausea, vomiting, or diarrhea.  Meds: Prescriptions prior to admission  Medication Sig Dispense Refill Last Dose  . aspirin 81 MG chewable tablet Chew 1 tablet (81 mg total) by mouth daily. 30 tablet 11 01/27/2015 at Unknown time  . Cholecalciferol (VITAMIN D) 2000 UNITS tablet Take 1 tablet (2,000 Units total) by mouth daily. 90 tablet 3 01/27/2015 at Unknown time  . enoxaparin (LOVENOX) 120 MG/0.8ML injection Inject 0.8 mLs (120 mg total) into the skin daily. 30 Syringe 2 01/27/2015 at Unknown time  . folic acid (FOLVITE) 1 MG tablet Take 1 tablet (1 mg total) by mouth every morning. 30 tablet 11 01/27/2015 at Unknown time  . HYDROmorphone (DILAUDID) 4 MG tablet Take 1  tablet (4 mg total) by mouth every 4 (four) hours as needed for severe pain. 90 tablet 0 01/27/2015 at Unknown time  . hydroxyurea (HYDREA) 500 MG capsule Take 1 capsule (500 mg total) by mouth daily. May take with food to minimize GI side effects. 30 capsule 0 01/27/2015 at Unknown time  . lisinopril (PRINIVIL,ZESTRIL) 10 MG tablet Take 1 tablet (10 mg total) by mouth daily. 30 tablet 1 01/27/2015 at Unknown time  . metoprolol succinate (TOPROL-XL) 50 MG 24 hr tablet Take 1 tablet (50 mg total) by mouth daily. 30 tablet 1 01/27/2015 at Unknown time  . morphine (MS CONTIN) 30 MG 12 hr tablet Take 1 tablet (30 mg total) by mouth every 12 (twelve) hours. 60 tablet 0 01/26/2015 at Unknown time  . potassium chloride SA (K-DUR,KLOR-CON) 20 MEQ tablet Take 1 tablet (20 mEq total) by mouth every morning. 30 tablet 3 01/27/2015 at Unknown time  . zolpidem (AMBIEN) 10 MG tablet Take 1 tablet (10 mg total) by mouth at bedtime as needed for sleep. 30 tablet 0 01/26/2015 at Unknown time    Allergies: Review of patient's allergies indicates no known allergies. Past Medical History  Diagnosis Date  . Sickle cell anemia (HCC)   . Blood transfusion   . Acute embolism and thrombosis of right internal jugular vein ([place])   . Hypokalemia   . Mood disorder ([place])   . History of pulmonary embolus (PE)   . Avascular necrosis (HCC)     Right Hip  . Leukocytosis     Chronic  . Thrombocytosis ([place])  Chronic  . Hypertension   . History of Clostridium difficile infection   . Uses marijuana   . Chronic anticoagulation   . Functional asplenia   . Former smoker   . Second hand tobacco smoke exposure   . Alcohol consumption of one to four drinks per day   . Noncompliance with medication regimen   . Sickle-cell crisis with associated acute chest syndrome ([place]) 05/13/2013  . Acute chest syndrome ([place]) 06/18/2013  . Demand ischemia ([place]) 01/02/2014  . Pulmonary hypertension [facility])    Past Surgical History   Procedure Laterality Date  . Right hip replacement      08/2006  . Cholecystectomy      01/2008  . Porta cath placement    . Porta cath removal    . Umbilical hernia repair      01/2008  . Excision of left periauricular cyst      10/2009  . Excision of right ear lobe cyst with primary closur      11/2007  . Portacath placement  01/05/2012    Procedure: INSERTION PORT-A-CATH;  Surgeon: Odis Hollingshead, MD;  Location: [place];  Service: General;  Laterality: N/A;  ultrasound guiced port a cath insertion with fluoroscopy   Family History  Problem Relation Age of Onset  . Sickle cell trait Mother   . Depression Mother   . Diabetes Mother   . Sickle cell trait Father   . Sickle cell trait Brother    Social History   Social History  . Marital Status: Single    Spouse Name: N/A  . Number of Children: 0  . Years of Education: 13   Occupational History  . 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 Smoking status: Former Smoker -- 13 years  . Smokeless tobacco: Never Used  . Alcohol Use: No  . Drug Use: 2.00 per week    Special: Marijuana     Comment: Marijuana weekly  . Sexual Activity:    Partners: Female    Museum/gallery curator: None     Comment: month ago   Other Topics Concern  . Not on file   Social History Narrative   Lives in an apartment.  Single.  Lives alone but has a girlfriend that helps care for him.  Does not use any assist devices.        Gerald [id number]  [phone] Mom, emergency contact      [facility]:   Patient continuing to live in her apartment in town alone. 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    Review of Systems: Constitutional: negative except for fatigue Eyes: positive for icterus Ears, nose, mouth, throat, and face: negative Respiratory: positive for dyspnea on exertion Cardiovascular: negative Gastrointestinal:  negative Genitourinary:negative Musculoskeletal:positive for arthralgias and back pain Neurological: negative Behavioral/Psych: negative Endocrine: negative Allergic/Immunologic: negative  Physical Exam: Blood pressure 106/69, pulse 100, temperature 98.9 F (37.2 C), temperature source Oral, resp. rate 20, height 6' (1.829 m), weight 165 lb (74.844 kg), SpO2 94 %. BP 106/69 mmHg  Pulse 100  Temp(Src) 98.9 F (37.2 C) (Oral)  Resp 20  Ht 6' (1.829 m)  Wt 165 lb (74.844 kg)  BMI 22.37 kg/m2  SpO2 94%  General Appearance:    Alert, cooperative, mild distress, appears older than stated age  Head:    Normocephalic, without obvious abnormality, atraumatic  Eyes:    PERRL, conjunctiva/corneas  clear, EOM's intact, scleral icterus bilaterally    Ears:    Normal TM's and external ear canals, both ears  Nose:   Nares normal, septum midline, mucosa normal, no drainage    or sinus tenderness  Throat:   Lips, mucosa, and tongue normal; teeth and gums normal  Neck:   Supple, symmetrical, trachea midline, no adenopathy;       thyroid:  No enlargement/tenderness/nodules; no carotid   bruit or JVD  Back:     Symmetric, no curvature, ROM normal, no CVA tenderness  Lungs:     Clear to auscultation bilaterally, respirations unlabored.   Chest wall:    No tenderness or deformity. Left port a cath  Heart:    Irregular rate and regular rhythm  Abdomen:     Soft, non-tender, bowel sounds active all four quadrants,    no masses, no organomegaly  Extremities:   Extremities normal, atraumatic, Left anterior leg, trace edema with tenderness to palpation  Pulses:   2+ and symmetric all extremities  Skin:   Skin color, texture, turgor normal, no rashes or lesions  Lymph nodes:   Cervical, supraclavicular, and axillary nodes normal  Neurologic:   CNII-XII intact. Normal strength, sensation and reflexes      throughout    Lab results: Results for orders placed or performed during the hospital encounter  of 01/27/15 (from the past 24 hour(s))  Comprehensive metabolic panel     Status: Abnormal   Collection Time: 01/27/15  3:42 AM  Result Value Ref Range   Sodium 138 135 - 145 mmol/L   Potassium 3.9 3.5 - 5.1 mmol/L   Chloride 109 101 - 111 mmol/L   CO2 21 (L) 22 - 32 mmol/L   Glucose, Bld 130 (H) 65 - 99 mg/dL   BUN 14 6 - 20 mg/dL   Creatinine, Ser 0.74 0.61 - 1.24 mg/dL   Calcium 8.7 (L) 8.9 - 10.3 mg/dL   Total Protein 7.2 6.5 - 8.1 g/dL   Albumin 4.0 3.5 - 5.0 g/dL   AST 37 15 - 41 U/L   ALT 19 17 - 63 U/L   Alkaline Phosphatase 85 38 - 126 U/L   Total Bilirubin 4.7 (H) 0.3 - 1.2 mg/dL   GFR calc non Af Amer >60 >60 mL/min   GFR calc Af Amer >60 >60 mL/min   Anion gap 8 5 - 15  CBC with Differential     Status: Abnormal   Collection Time: 01/27/15  3:42 AM  Result Value Ref Range   WBC 16.6 (H) 4.0 - 10.5 K/uL   RBC 2.34 (L) 4.22 - 5.81 MIL/uL   Hemoglobin 7.0 (L) 13.0 - 17.0 g/dL   HCT 20.1 (L) 39.0 - 52.0 %   MCV 85.9 78.0 - 100.0 fL   MCH 29.9 26.0 - 34.0 pg   MCHC 34.8 30.0 - 36.0 g/dL   RDW 20.1 (H) 11.5 - 15.5 %   Platelets 454 (H) 150 - 400 K/uL   Neutrophils Relative % 64 %   Lymphocytes Relative 17 %   Monocytes Relative 14 %   Eosinophils Relative 4 %   Basophils Relative 1 %   Neutro Abs 10.6 (H) 1.7 - 7.7 K/uL   Lymphs Abs 2.8 0.7 - 4.0 K/uL   Monocytes Absolute 2.3 (H) 0.1 - 1.0 K/uL   Eosinophils Absolute 0.7 0.0 - 0.7 K/uL   Basophils Absolute 0.2 (H) 0.0 - 0.1 K/uL   RBC Morphology POLYCHROMASIA PRESENT  Smear Review LARGE PLATELETS PRESENT    *Note: Due to a large number of results and/or encounters for the requested time period, some results have not been displayed. A complete set of results can be found in Results Review.    Imaging results:  Dg Chest 2 View  01/26/2015  CLINICAL DATA:  Acute onset of bilateral generalized chest pain. Initial encounter. EXAM: CHEST  2 VIEW COMPARISON:  Chest radiograph from 01/14/2015 FINDINGS: The lungs  are well-aerated. Peribronchial thickening is noted. There is no evidence of focal opacification, pleural effusion or pneumothorax. The heart is enlarged. A left-sided chest port is noted ending about the distal SVC. No acute osseous abnormalities are seen. Clips are noted within the right upper quadrant, reflecting prior cholecystectomy. IMPRESSION: Peribronchial thickening noted. Lungs otherwise grossly clear. Cardiomegaly noted. Electronically Signed   By: Garald Balding M.D.   On: 01/26/2015 01:30   BP 106/69 mmHg  Pulse 100  Temp(Src) 98.9 F (37.2 C) (Oral)  Resp 19  Ht 6' (1.829 m)  Wt 165 lb (74.844 kg)  BMI 22.37 kg/m2  SpO2 93% Assessment & Plan:  Patient will be admitted to the day infusion center for extended observation  Start IV D5.45 for cellular rehydration at 100/hr  Start Toradol 30 mg IV times 1 for inflammation.  Start Dilaudid PCA High Concentration per weight based protocol.   Patient will be re-evaluated for pain intensity in the context of function and relationship to baseline as care progresses.   Reviewed labs from 01/26/2015, consistent with baseline  Oxygen saturation is 93% on room air, will start oxygen 2 liters Leotha Westermeyer M 01/27/2015, 9:34 AM

## 2015-01-27 NOTE — Discharge Instructions (Signed)
Sickle Cell Anemia, Adult Sickle cell anemia is a condition in which red blood cells have an abnormal "sickle" shape. This abnormal shape shortens the cells' life span, which results in a lower than normal concentration of red blood cells in the blood. The sickle shape also causes the cells to clump together and block free blood flow through the blood vessels. As a result, the tissues and organs of the body do not receive enough oxygen. Sickle cell anemia causes organ damage and pain and increases the risk of infection. CAUSES  Sickle cell anemia is a genetic disorder. Those who receive two copies of the gene have the condition, and those who receive one copy have the trait. RISK FACTORS The sickle cell gene is most common in people whose families originated in [place]. Other areas of the globe where sickle cell trait occurs include the [place], [place] and [place], the [place], and the [place].  SIGNS AND SYMPTOMS  Pain, especially in the extremities, back, chest, or abdomen (common). The pain may start suddenly or may develop following an illness, especially if there is dehydration. Pain can also occur due to overexertion or exposure to extreme temperature changes.  Frequent severe bacterial infections, especially certain types of pneumonia and meningitis.  Pain and swelling in the hands and feet.  Decreased activity.   Loss of appetite.   Change in behavior.  Headaches.  Seizures.  Shortness of breath or difficulty breathing.  Vision changes.  Skin ulcers. Those with the trait may not have symptoms or they may have mild symptoms.  DIAGNOSIS  Sickle cell anemia is diagnosed with blood tests that demonstrate the genetic trait. It is often diagnosed during the newborn period, due to mandatory testing nationwide. A variety of blood tests, X-rays, CT scans, MRI scans, ultrasounds, and lung function tests may also be done to monitor the condition. TREATMENT  Sickle  cell anemia may be treated with:  Medicines. You may be given pain medicines, antibiotic medicines (to treat and prevent infections) or medicines to increase the production of certain types of hemoglobin.  Fluids.  Oxygen.  Blood transfusions. HOME CARE INSTRUCTIONS   Drink enough fluid to keep your urine clear or pale yellow. Increase your fluid intake in hot weather and during exercise.  Do not smoke. Smoking lowers oxygen levels in the blood.   Only take over-the-counter or prescription medicines for pain, fever, or discomfort as directed by your health care provider.  Take antibiotics as directed by your health care provider. Make sure you finish them it even if you start to feel better.   Take supplements as directed by your health care provider.   Consider wearing a medical alert bracelet. This tells anyone caring for you in an emergency of your condition.   When traveling, keep your medical information, health care provider's names, and the medicines you take with you at all times.   If you develop a fever, do not take medicines to reduce the fever right away. This could cover up a problem that is developing. Notify your health care provider.  Keep all follow-up appointments with your health care provider. Sickle cell anemia requires regular medical care. SEEK MEDICAL CARE IF: You have a fever. SEEK IMMEDIATE MEDICAL CARE IF:   You feel dizzy or faint.   You have new abdominal pain, especially on the left side near the stomach area.   You develop a persistent, often uncomfortable and painful penile erection (priapism). If this is not treated immediately it   will lead to impotence.   You have numbness your arms or legs or you have a hard time moving them.   You have a hard time with speech.   You have a fever or persistent symptoms for more than 2-3 days.   You have a fever and your symptoms suddenly get worse.   You have signs or symptoms of infection.  These include:   Chills.   Abnormal tiredness (lethargy).   Irritability.   Poor eating.   Vomiting.   You develop pain that is not helped with medicine.   You develop shortness of breath.  You have pain in your chest.   You are coughing up pus-like or bloody sputum.   You develop a stiff neck.  Your feet or hands swell or have pain.  Your abdomen appears bloated.  You develop joint pain. MAKE SURE YOU:  Understand these instructions.   This information is not intended to replace advice given to you by your health care provider. Make sure you discuss any questions you have with your health care provider.   Document Released: 04/27/2005 Document Revised: 02/07/2014 Document Reviewed: 08/29/2012 Elsevier Interactive Patient Education 2016 [employer]  

## 2015-01-27 NOTE — ED Provider Notes (Signed)
CSN: [id number]     Arrival date & time 01/27/15  0259 History  By signing my name below, I, [name], attest that this documentation has been prepared under the direction and in the presence of Davonna Belling, MD. Electronically Signed: Judithann Sauger, ED Scribe. 01/27/2015. 3:26 AM.    Chief Complaint  Patient presents with  . Sickle Cell Pain Crisis   The history is provided by the patient. No language interpreter was used.   HPI Comments: [name] is a 34 y.o. male with a hx of sickle cell anemia and HTN who presents to the Emergency Department complaining of gradually worsening constant left sided pain consistent with his sickle cell pain flare up onset one week ago. He reports that he is on 4L of home O2 and he states that he has been compliant with it. He denies any recent changes to his medication. He denies any sick contacts. No alleviating factors noted. Pt was seen here yesterday for the same symptom and received Dilaudid. He adds that he is here today because the Dilaudid did not provide relief. He denies any cough or n/v/d.    Past Medical History  Diagnosis Date  . Sickle cell anemia (HCC)   . Blood transfusion   . Acute embolism and thrombosis of right internal jugular vein ([place])   . Hypokalemia   . Mood disorder ([place])   . History of pulmonary embolus (PE)   . Avascular necrosis (HCC)     Right Hip  . Leukocytosis     Chronic  . Thrombocytosis (HCC)     Chronic  . Hypertension   . History of Clostridium difficile infection   . Uses marijuana   . Chronic anticoagulation   . Functional asplenia   . Former smoker   . Second hand tobacco smoke exposure   . Alcohol consumption of one to four drinks per day   . Noncompliance with medication regimen   . Sickle-cell crisis with associated acute chest syndrome ([place]) 05/13/2013  . Acute chest syndrome ([place]) 06/18/2013  . Demand ischemia ([place]) 01/02/2014  . Pulmonary hypertension [facility])    Past Surgical  History  Procedure Laterality Date  . Right hip replacement      08/2006  . Cholecystectomy      01/2008  . Porta cath placement    . Porta cath removal    . Umbilical hernia repair      01/2008  . Excision of left periauricular cyst      10/2009  . Excision of right ear lobe cyst with primary closur      11/2007  . Portacath placement  01/05/2012    Procedure: INSERTION PORT-A-CATH;  Surgeon: Odis Hollingshead, MD;  Location: [place];  Service: General;  Laterality: N/A;  ultrasound guiced port a cath insertion with fluoroscopy   Family History  Problem Relation Age of Onset  . Sickle cell trait Mother   . Depression Mother   . Diabetes Mother   . Sickle cell trait Father   . Sickle cell trait Brother    Social History  Substance Use Topics  . Smoking status: Former Smoker -- 13 years  . Smokeless tobacco: Never Used  . Alcohol Use: No    Review of Systems  Respiratory: Negative for cough.   Cardiovascular: Positive for chest pain.  Gastrointestinal: Negative for nausea, vomiting and diarrhea.  All other systems reviewed and are negative.     Allergies  Review of patient's allergies indicates  no known allergies.  Home Medications   Prior to Admission medications   Medication Sig Start Date End Date Taking? Authorizing Provider  aspirin 81 MG chewable tablet Chew 1 tablet (81 mg total) by mouth daily. 07/23/14  Yes [name], MD  Cholecalciferol (VITAMIN D) 2000 UNITS tablet Take 1 tablet (2,000 Units total) by mouth daily. 02/13/14  Yes [name], MD  enoxaparin (LOVENOX) 120 MG/0.8ML injection Inject 0.8 mLs (120 mg total) into the skin daily. 12/03/14  Yes [name], MD  folic acid (FOLVITE) 1 MG tablet Take 1 tablet (1 mg total) by mouth every morning. 10/08/13  Yes [name], MD  HYDROmorphone (DILAUDID) 4 MG tablet Take 1 tablet (4 mg total) by mouth every 4 (four) hours as needed for severe pain. 12/23/14  Yes Dorena Dew,  FNP  hydroxyurea (HYDREA) 500 MG capsule Take 1 capsule (500 mg total) by mouth daily. May take with food to minimize GI side effects. 11/06/14  Yes Dorena Dew, FNP  lisinopril (PRINIVIL,ZESTRIL) 10 MG tablet Take 1 tablet (10 mg total) by mouth daily. 01/22/14  Yes [name], MD  metoprolol succinate (TOPROL-XL) 50 MG 24 hr tablet Take 1 tablet (50 mg total) by mouth daily. 11/28/14  Yes [name], MD  morphine (MS CONTIN) 30 MG 12 hr tablet Take 1 tablet (30 mg total) by mouth every 12 (twelve) hours. 12/23/14  Yes Dorena Dew, FNP  potassium chloride SA (K-DUR,KLOR-CON) 20 MEQ tablet Take 1 tablet (20 mEq total) by mouth every morning. 06/09/14  Yes [name], MD  zolpidem (AMBIEN) 10 MG tablet Take 1 tablet (10 mg total) by mouth at bedtime as needed for sleep. 01/13/15  Yes Dorena Dew, FNP   BP 113/79 mmHg  Pulse 93  Temp(Src) 98.1 F (36.7 C) (Oral)  Resp 23  SpO2 97% Physical Exam  Constitutional: He is oriented to person, place, and time. He appears well-developed and well-nourished. No distress.  HENT:  Head: Normocephalic and atraumatic.  Eyes: Conjunctivae and EOM are normal.  Neck: Neck supple. No tracheal deviation present.  Cardiovascular: Normal rate.   Pulmonary/Chest: Effort normal. No respiratory distress.  Musculoskeletal: Normal range of motion.  Neurological: He is alert and oriented to person, place, and time.  Skin: Skin is warm and dry.  Psychiatric: He has a normal mood and affect. His behavior is normal.  Nursing note and vitals reviewed.   ED Course  Procedures (including critical care time) DIAGNOSTIC STUDIES: Oxygen Saturation is 91% on RA, low by my interpretation.    COORDINATION OF CARE: 3:20 AM- Pt advised of plan for treatment and pt agrees.    Labs Review Labs Reviewed  COMPREHENSIVE METABOLIC PANEL - Abnormal; Notable for the following:    CO2 21 (*)    Glucose, Bld 130 (*)    Calcium 8.7 (*)     Total Bilirubin 4.7 (*)    All other components within normal limits  CBC WITH DIFFERENTIAL/PLATELET - Abnormal; Notable for the following:    WBC 16.6 (*)    RBC 2.34 (*)    Hemoglobin 7.0 (*)    HCT 20.1 (*)    RDW 20.1 (*)    Platelets 454 (*)    Neutro Abs 10.6 (*)    Monocytes Absolute 2.3 (*)    Basophils Absolute 0.2 (*)    All other components within normal limits    Imaging Review Dg Chest 2 View  01/26/2015  CLINICAL DATA:  Acute onset of bilateral generalized chest pain. Initial encounter. EXAM: CHEST  2 VIEW COMPARISON:  Chest radiograph from 01/14/2015 FINDINGS: The lungs are well-aerated. Peribronchial thickening is noted. There is no evidence of focal opacification, pleural effusion or pneumothorax. The heart is enlarged. A left-sided chest port is noted ending about the distal SVC. No acute osseous abnormalities are seen. Clips are noted within the right upper quadrant, reflecting prior cholecystectomy. IMPRESSION: Peribronchial thickening noted. Lungs otherwise grossly clear. Cardiomegaly noted. Electronically Signed   By: Garald Balding M.D.   On: 01/26/2015 01:30   Davonna Belling, MD has personally reviewed and evaluated these images and lab results as part of his medical decision-making.   EKG Interpretation None      MDM   Final diagnoses:  Sickle cell pain crisis [facility]    Patient with sickle cell disease. History of same. Left-sided chest pain. States she's been using his pain meds at home. Laboratory baseline. X-ray reassuring. Patient's pain is gone now no 6. States he does not want beaded admitted to the hospital and is just waiting so he can go to the sickle cell clinic. We will not keep him in the ER for more hours just so he can wait. Will discharge.  I personally performed the services described in this documentation, which was scribed in my presence. The recorded information has been reviewed and is accurate.     Davonna Belling, MD 01/27/15  [phone]

## 2015-01-27 NOTE — ED Notes (Signed)
Pt was seen here 12/26 for [facility] and states he was discharged with instructions to take his home meds. He states this was unable to get his pain under control

## 2015-01-27 NOTE — Telephone Encounter (Signed)
Pt called and told that he could come [facility] for [facility]. Pt stated he could be here in 20 mins.

## 2015-01-27 NOTE — ED Notes (Signed)
Pt states that he has been having back and rib pain for 5-6 days; pt was seen on 12/26; pt states that he is still having the same pain without relief of home meds; pt did not follow up with his PCP

## 2015-01-27 NOTE — Telephone Encounter (Signed)
Patient C/O pain to upper body and ribs that is 9/10 on pain scale.  Patient denies N/V/D, denies chest pain or shortness of breath.  Patient states no improvement with home medications. I advised that I would notify the physician and give him a call back. Patient verbalizes understanding.

## 2015-01-27 NOTE — Discharge Summary (Signed)
[facility] Discharge Summary   Patient ID: [name] MRN: [id number] DOB/AGE: 09/07/1980 34 y.o.  Admit date: 01/27/2015 Discharge date: 01/27/2015  Primary Care Physician:  Gerald Chessman, MD  Admission Diagnoses:  Active Problems:   Hb-SS disease with crisis [facility])   Discharge Medications:    Medication List    ASK your doctor about these medications        aspirin 81 MG chewable tablet  Chew 1 tablet (81 mg total) by mouth daily.     enoxaparin 120 MG/0.8ML injection  Commonly known as:  LOVENOX  Inject 0.8 mLs (120 mg total) into the skin daily.     folic acid 1 MG tablet  Commonly known as:  FOLVITE  Take 1 tablet (1 mg total) by mouth every morning.     HYDROmorphone 4 MG tablet  Commonly known as:  DILAUDID  Take 1 tablet (4 mg total) by mouth every 4 (four) hours as needed for severe pain.     hydroxyurea 500 MG capsule  Commonly known as:  HYDREA  Take 1 capsule (500 mg total) by mouth daily. May take with food to minimize GI side effects.     lisinopril 10 MG tablet  Commonly known as:  PRINIVIL,ZESTRIL  Take 1 tablet (10 mg total) by mouth daily.     metoprolol succinate 50 MG 24 hr tablet  Commonly known as:  TOPROL-XL  Take 1 tablet (50 mg total) by mouth daily.     morphine 30 MG 12 hr tablet  Commonly known as:  MS CONTIN  Take 1 tablet (30 mg total) by mouth every 12 (twelve) hours.     potassium chloride SA 20 MEQ tablet  Commonly known as:  K-DUR,KLOR-CON  Take 1 tablet (20 mEq total) by mouth every morning.     Vitamin D 2000 units tablet  Take 1 tablet (2,000 Units total) by mouth daily.     zolpidem 10 MG tablet  Commonly known as:  AMBIEN  Take 1 tablet (10 mg total) by mouth at bedtime as needed for sleep.         Consults:  None  Significant Diagnostic Studies:  Dg Chest 2 View  01/26/2015  CLINICAL DATA:  Acute onset of bilateral generalized chest pain. Initial encounter. EXAM: CHEST   2 VIEW COMPARISON:  Chest radiograph from 01/14/2015 FINDINGS: The lungs are well-aerated. Peribronchial thickening is noted. There is no evidence of focal opacification, pleural effusion or pneumothorax. The heart is enlarged. A left-sided chest port is noted ending about the distal SVC. No acute osseous abnormalities are seen. Clips are noted within the right upper quadrant, reflecting prior cholecystectomy. IMPRESSION: Peribronchial thickening noted. Lungs otherwise grossly clear. Cardiomegaly noted. Electronically Signed   By: Garald Balding M.D.   On: 01/26/2015 01:30   Dg Chest 2 View  12/29/2014  CLINICAL DATA:  Sickle Cell Crisis; SOB;dyspnea with exertion; no chest pain; Former Smoker; Hx HTN; Pulmonary Embolus; Patient has Port A Cath EXAM: CHEST  2 VIEW COMPARISON:  12/16/2014 FINDINGS: Left-sided Port-A-Cath tip to the lower superior vena cava. The heart is mildly enlarged. There has been some improvement in aeration of the lungs bilaterally. Very minimal opacities persist primarily in the right lower lobe. No frank consolidations. No pleural effusions or pulmonary edema. IMPRESSION: 1. Stable cardiomegaly. 2. Improved aeration. Electronically Signed   By: Nolon Nations M.D.   On: 12/29/2014 15:00   Dg Chest Portable 1 View  01/14/2015  CLINICAL DATA:  Sickle cell pain in the ribs and low back. Intermittently for the last week. EXAM: PORTABLE CHEST 1 VIEW COMPARISON:  01/08/2015 FINDINGS: Cardiac enlargement with mild pulmonary vascular congestion similar to prior study. Mild interstitial fibrosis. No evidence of edema or consolidation. No blunting of costophrenic angles. No pneumothorax. Mediastinal contours appear intact. Power port type central venous catheter with tip over the cavoatrial junction region. The sclerosis in the proximal left humerus consistent with bone infarct. IMPRESSION: Cardiac enlargement with mild pulmonary vascular congestion. No focal consolidation. Electronically  Signed   By: Lucienne Capers M.D.   On: 01/14/2015 22:11   Dg Chest Portable 1 View  01/08/2015  CLINICAL DATA:  Acute onset of bilateral chest and rib pain. Initial encounter. EXAM: PORTABLE CHEST 1 VIEW COMPARISON:  Chest radiograph performed 12/29/2014 FINDINGS: The lungs are well-aerated. Vascular congestion is noted. There is no evidence of focal opacification, pleural effusion or pneumothorax. The cardiomediastinal silhouette is mildly enlarged. A left-sided chest port is noted ending about the cavoatrial junction. No acute osseous abnormalities are seen. IMPRESSION: Vascular congestion and mild cardiomegaly. Lungs remain grossly clear. No displaced rib fracture seen. Electronically Signed   By: Garald Balding M.D.   On: 01/08/2015 19:18    [facility] Course: -[name] was admitted to the day infusion center for extended observation.  -Started D5.45 for cellular re-hydration -Reviewed laboratory values, consistent with baseline -High Concentration PCA per weight based protocol for pain control. -Patient's oxygen saturation was 93% on 3 Liters of oxygen, patient to continue to wear oxygen as previously prescribed.    Patient used a total of 21 mg with 35 demands and 31 deliveries.  Patient was given Dilaudid 4 mg per home medication regimen. He states that pain intensity is 5/10, will discharge home in stable condition on 3 liters of oxygen per previous home oxygen orders.   He maintains that he can function at home on current medication regimen. Patient is alert, oriented and ambulatory. He is encouraged to continue hydrating and consistent on medication regimen.    Physical Exam at Discharge:   BP 112/70 mmHg  Pulse 87  Temp(Src) 98.4 F (36.9 C) (Oral)  Resp 19  Ht 6' (1.829 m)  Wt 165 lb (74.844 kg)  BMI 22.37 kg/m2  SpO2 91%   General Appearance:    Alert, cooperative, no distress, appears stated age  Eyes:    PERRL, conjunctiva/corneas clear, EOM's  intact, fundi    benign, icterus bilaterally       Back:     Symmetric, no curvature, ROM normal, no CVA tenderness  Lungs:     Clear to auscultation bilaterally, respirations unlabored  Chest wall:    No tenderness or deformity  Heart:    Irregular rate and rhythm, S1 and S2 normal, no murmur, rub   or gallop  Lymph nodes:   Cervical, supraclavicular, and axillary nodes normal  Neurologic:   CNII-XII intact. Normal strength, sensation and reflexes      throughout     Disposition at Discharge: 01-Home or Self Care  Discharge Orders:   Condition at Discharge:   Stable  Time spent on Discharge:  15 minutes  Signed: Bernetta Sutley M 01/27/2015, 4:36 PM

## 2015-01-27 NOTE — Progress Notes (Signed)
[facility]. Pt treated with IV fluids, Toradol and Dilaudid PCA. Stated pain was 9 on admission but has come down to 5 at discharge. Discharge instructions given to patient with verbal understanding. Pt reminded to call to schedule office visit. Voiced understanding, A/O, ambulatory, discharged to home. Blair Hailey, RN

## 2015-01-29 ENCOUNTER — Telehealth: Payer: Self-pay | Admitting: Internal Medicine

## 2015-01-29 DIAGNOSIS — D571 Sickle-cell disease without crisis: Secondary | ICD-10-CM

## 2015-01-29 DIAGNOSIS — G894 Chronic pain syndrome: Secondary | ICD-10-CM

## 2015-01-29 MED ORDER — HYDROMORPHONE HCL 4 MG PO TABS: 4.0000 mg | ORAL_TABLET | ORAL | Status: DC | PRN

## 2015-01-29 MED ORDER — MORPHINE SULFATE ER 30 MG PO TBCR: 30.0000 mg | EXTENDED_RELEASE_TABLET | Freq: Two times a day (BID) | ORAL | Status: DC

## 2015-01-29 NOTE — Telephone Encounter (Signed)
Refill request for Dilaudid 4mg  and Morphine 30mg . LOV. 12/05/2014. Please advise. Thanks!

## 2015-01-29 NOTE — Telephone Encounter (Signed)
Reviewed [place] Substance Reporting system prior to prescribing opiate medications. No inconsistencies noted.   Meds ordered this encounter  Medications  . morphine (MS CONTIN) 30 MG 12 hr tablet    Sig: Take 1 tablet (30 mg total) by mouth every 12 (twelve) hours.    Dispense:  60 tablet    Refill:  0    Order Specific Question:  Supervising Provider    Answer:  Tresa Garter [id number]  . HYDROmorphone (DILAUDID) 4 MG tablet    Sig: Take 1 tablet (4 mg total) by mouth every 4 (four) hours as needed for severe pain.    Dispense:  90 tablet    Refill:  0    Order Specific Question:  Supervising Provider    Answer:  Tresa Garter [id number]    Dorena Dew, FNP

## 2015-01-31 ENCOUNTER — Encounter (HOSPITAL_COMMUNITY): Payer: Self-pay | Admitting: Emergency Medicine

## 2015-01-31 ENCOUNTER — Emergency Department (HOSPITAL_COMMUNITY): Payer: Medicare Other

## 2015-01-31 ENCOUNTER — Inpatient Hospital Stay (HOSPITAL_COMMUNITY)
Admission: EM | Admit: 2015-01-31 | Discharge: 2015-02-12 | DRG: 812 | Disposition: A | Discharge status: Home / Self Care | Payer: Medicare Other | Attending: Internal Medicine | Admitting: Internal Medicine

## 2015-01-31 DIAGNOSIS — R0781 Pleurodynia: Secondary | ICD-10-CM | POA: Diagnosis not present

## 2015-01-31 DIAGNOSIS — D6489 Other specified anemias: Secondary | ICD-10-CM | POA: Diagnosis not present

## 2015-01-31 DIAGNOSIS — Z818 Family history of other mental and behavioral disorders: Secondary | ICD-10-CM | POA: Diagnosis not present

## 2015-01-31 DIAGNOSIS — D638 Anemia in other chronic diseases classified elsewhere: Secondary | ICD-10-CM | POA: Diagnosis not present

## 2015-01-31 DIAGNOSIS — Z87891 Personal history of nicotine dependence: Secondary | ICD-10-CM | POA: Diagnosis not present

## 2015-01-31 DIAGNOSIS — D5700 Hb-SS disease with crisis, unspecified: Principal | ICD-10-CM | POA: Diagnosis present

## 2015-01-31 DIAGNOSIS — F39 Unspecified mood [affective] disorder: Secondary | ICD-10-CM | POA: Diagnosis present

## 2015-01-31 DIAGNOSIS — I10 Essential (primary) hypertension: Secondary | ICD-10-CM

## 2015-01-31 DIAGNOSIS — D582 Other hemoglobinopathies: Secondary | ICD-10-CM | POA: Diagnosis not present

## 2015-01-31 DIAGNOSIS — Z833 Family history of diabetes mellitus: Secondary | ICD-10-CM | POA: Diagnosis not present

## 2015-01-31 DIAGNOSIS — R000 Tachycardia, unspecified: Secondary | ICD-10-CM | POA: Diagnosis present

## 2015-01-31 DIAGNOSIS — Z79891 Long term (current) use of opiate analgesic: Secondary | ICD-10-CM | POA: Diagnosis not present

## 2015-01-31 DIAGNOSIS — G894 Chronic pain syndrome: Secondary | ICD-10-CM | POA: Diagnosis not present

## 2015-01-31 DIAGNOSIS — I5030 Unspecified diastolic (congestive) heart failure: Secondary | ICD-10-CM | POA: Diagnosis present

## 2015-01-31 DIAGNOSIS — Z96641 Presence of right artificial hip joint: Secondary | ICD-10-CM | POA: Diagnosis present

## 2015-01-31 DIAGNOSIS — Z86711 Personal history of pulmonary embolism: Secondary | ICD-10-CM | POA: Diagnosis not present

## 2015-01-31 DIAGNOSIS — Z79899 Other long term (current) drug therapy: Secondary | ICD-10-CM | POA: Diagnosis not present

## 2015-01-31 DIAGNOSIS — Z7982 Long term (current) use of aspirin: Secondary | ICD-10-CM | POA: Diagnosis not present

## 2015-01-31 DIAGNOSIS — I2721 Secondary pulmonary arterial hypertension: Secondary | ICD-10-CM

## 2015-01-31 DIAGNOSIS — G4700 Insomnia, unspecified: Secondary | ICD-10-CM | POA: Diagnosis present

## 2015-01-31 DIAGNOSIS — I482 Chronic atrial fibrillation, unspecified: Secondary | ICD-10-CM | POA: Diagnosis present

## 2015-01-31 DIAGNOSIS — Z9981 Dependence on supplemental oxygen: Secondary | ICD-10-CM

## 2015-01-31 DIAGNOSIS — Z832 Family history of diseases of the blood and blood-forming organs and certain disorders involving the immune mechanism: Secondary | ICD-10-CM

## 2015-01-31 DIAGNOSIS — Z7901 Long term (current) use of anticoagulants: Secondary | ICD-10-CM | POA: Diagnosis not present

## 2015-01-31 DIAGNOSIS — I272 Other secondary pulmonary hypertension: Secondary | ICD-10-CM | POA: Diagnosis present

## 2015-01-31 DIAGNOSIS — I2699 Other pulmonary embolism without acute cor pulmonale: Secondary | ICD-10-CM | POA: Diagnosis present

## 2015-01-31 DIAGNOSIS — J9611 Chronic respiratory failure with hypoxia: Secondary | ICD-10-CM | POA: Diagnosis not present

## 2015-01-31 DIAGNOSIS — Z86718 Personal history of other venous thrombosis and embolism: Secondary | ICD-10-CM | POA: Diagnosis not present

## 2015-01-31 DIAGNOSIS — R079 Chest pain, unspecified: Secondary | ICD-10-CM | POA: Diagnosis not present

## 2015-01-31 DIAGNOSIS — Z7722 Contact with and (suspected) exposure to environmental tobacco smoke (acute) (chronic): Secondary | ICD-10-CM | POA: Diagnosis present

## 2015-01-31 DIAGNOSIS — D72829 Elevated white blood cell count, unspecified: Secondary | ICD-10-CM | POA: Diagnosis not present

## 2015-01-31 DIAGNOSIS — D649 Anemia, unspecified: Secondary | ICD-10-CM | POA: Diagnosis not present

## 2015-01-31 DIAGNOSIS — R52 Pain, unspecified: Secondary | ICD-10-CM | POA: Diagnosis not present

## 2015-01-31 LAB — CBC WITH DIFFERENTIAL/PLATELET
BASOS ABS: 0.2 10*3/uL — AB (ref 0.0–0.1)
Basophils Relative: 1 %
EOS PCT: 4 %
Eosinophils Absolute: 0.7 10*3/uL (ref 0.0–0.7)
HCT: 18.5 % — ABNORMAL LOW (ref 39.0–52.0)
Hemoglobin: 6.2 g/dL — CL (ref 13.0–17.0)
LYMPHS ABS: 3.4 10*3/uL (ref 0.7–4.0)
Lymphocytes Relative: 19 %
MCH: 29.4 pg (ref 26.0–34.0)
MCHC: 33.5 g/dL (ref 30.0–36.0)
MCV: 87.7 fL (ref 78.0–100.0)
MONO ABS: 2 10*3/uL — AB (ref 0.1–1.0)
Monocytes Relative: 11 %
NEUTROS PCT: 65 %
Neutro Abs: 11.7 10*3/uL — ABNORMAL HIGH (ref 1.7–7.7)
PLATELETS: 521 10*3/uL — AB (ref 150–400)
RBC: 2.11 MIL/uL — AB (ref 4.22–5.81)
RDW: 21.7 % — AB (ref 11.5–15.5)
WBC: 18 10*3/uL — AB (ref 4.0–10.5)

## 2015-01-31 LAB — COMPREHENSIVE METABOLIC PANEL
ALT: 33 U/L (ref 17–63)
AST: 46 U/L — AB (ref 15–41)
Albumin: 4.1 g/dL (ref 3.5–5.0)
Alkaline Phosphatase: 86 U/L (ref 38–126)
Anion gap: 9 (ref 5–15)
BILIRUBIN TOTAL: 3.9 mg/dL — AB (ref 0.3–1.2)
BUN: 20 mg/dL (ref 6–20)
CHLORIDE: 107 mmol/L (ref 101–111)
CO2: 23 mmol/L (ref 22–32)
CREATININE: 1.06 mg/dL (ref 0.61–1.24)
Calcium: 8.8 mg/dL — ABNORMAL LOW (ref 8.9–10.3)
Glucose, Bld: 119 mg/dL — ABNORMAL HIGH (ref 65–99)
POTASSIUM: 4 mmol/L (ref 3.5–5.1)
Sodium: 139 mmol/L (ref 135–145)
TOTAL PROTEIN: 7.2 g/dL (ref 6.5–8.1)

## 2015-01-31 LAB — PREPARE RBC (CROSSMATCH)

## 2015-01-31 LAB — RETICULOCYTES
RBC.: 2.11 MIL/uL — AB (ref 4.22–5.81)
RETIC COUNT ABSOLUTE: 270.1 10*3/uL — AB (ref 19.0–186.0)
RETIC CT PCT: 12.8 % — AB (ref 0.4–3.1)

## 2015-01-31 MED ORDER — SODIUM CHLORIDE 0.9 % IV SOLN: 10.0000 mL/h | Freq: Once | INTRAVENOUS | Status: DC

## 2015-01-31 MED ORDER — ENOXAPARIN SODIUM 120 MG/0.8ML ~~LOC~~ SOLN
120.0000 mg | SUBCUTANEOUS | Status: DC
  Administered 2015-02-01 – 2015-02-12 (×12): 120 mg via SUBCUTANEOUS
  Filled 2015-01-31 (×12): qty 0.8

## 2015-01-31 MED ORDER — LISINOPRIL 10 MG PO TABS
10.0000 mg | ORAL_TABLET | Freq: Every day | ORAL | Status: DC
  Administered 2015-02-01 – 2015-02-12 (×12): 10 mg via ORAL
  Filled 2015-01-31 (×12): qty 1

## 2015-01-31 MED ORDER — HYDROMORPHONE HCL 1 MG/ML IJ SOLN
1.0000 mg | INTRAMUSCULAR | Status: AC | PRN
  Administered 2015-01-31 (×3): 1 mg via INTRAVENOUS
  Filled 2015-01-31 (×3): qty 1

## 2015-01-31 MED ORDER — ONDANSETRON HCL 4 MG/2ML IJ SOLN: 4.0000 mg | INTRAMUSCULAR | Status: DC | PRN

## 2015-01-31 MED ORDER — HYDROXYUREA 500 MG PO CAPS
500.0000 mg | ORAL_CAPSULE | Freq: Every day | ORAL | Status: DC
  Administered 2015-02-01 – 2015-02-12 (×12): 500 mg via ORAL
  Filled 2015-01-31 (×13): qty 1

## 2015-01-31 MED ORDER — SODIUM CHLORIDE 0.45 % IV SOLN
INTRAVENOUS | Status: DC
  Administered 2015-01-31: 21:00:00 via INTRAVENOUS

## 2015-01-31 MED ORDER — NALOXONE HCL 0.4 MG/ML IJ SOLN: 0.4000 mg | INTRAMUSCULAR | Status: DC | PRN

## 2015-01-31 MED ORDER — ONDANSETRON HCL 4 MG PO TABS: 4.0000 mg | ORAL_TABLET | ORAL | Status: DC | PRN

## 2015-01-31 MED ORDER — HYDROMORPHONE HCL 1 MG/ML IJ SOLN
1.0000 mg | INTRAMUSCULAR | Status: AC | PRN
  Administered 2015-01-31 – 2015-02-01 (×3): 1 mg via INTRAVENOUS
  Filled 2015-01-31 (×3): qty 1

## 2015-01-31 MED ORDER — ASPIRIN 81 MG PO CHEW
81.0000 mg | CHEWABLE_TABLET | Freq: Every day | ORAL | Status: DC
  Administered 2015-02-01 – 2015-02-12 (×12): 81 mg via ORAL
  Filled 2015-01-31 (×12): qty 1

## 2015-01-31 MED ORDER — POTASSIUM CHLORIDE CRYS ER 20 MEQ PO TBCR
20.0000 meq | EXTENDED_RELEASE_TABLET | Freq: Every morning | ORAL | Status: DC
  Administered 2015-02-01 – 2015-02-12 (×12): 20 meq via ORAL
  Filled 2015-01-31 (×12): qty 1

## 2015-01-31 MED ORDER — DIPHENHYDRAMINE HCL 50 MG/ML IJ SOLN
12.5000 mg | Freq: Four times a day (QID) | INTRAMUSCULAR | Status: DC | PRN
  Administered 2015-02-01 (×2): 12.5 mg via INTRAVENOUS
  Filled 2015-01-31 (×2): qty 1

## 2015-01-31 MED ORDER — DIPHENHYDRAMINE HCL 25 MG PO CAPS
50.0000 mg | ORAL_CAPSULE | Freq: Once | ORAL | Status: AC
  Administered 2015-01-31: 50 mg via ORAL
  Filled 2015-01-31: qty 2

## 2015-01-31 MED ORDER — POLYETHYLENE GLYCOL 3350 17 G PO PACK: 17.0000 g | PACK | Freq: Every day | ORAL | Status: DC | PRN

## 2015-01-31 MED ORDER — SENNOSIDES-DOCUSATE SODIUM 8.6-50 MG PO TABS
1.0000 | ORAL_TABLET | Freq: Two times a day (BID) | ORAL | Status: DC
  Administered 2015-02-01 – 2015-02-12 (×24): 1 via ORAL
  Filled 2015-01-31 (×24): qty 1

## 2015-01-31 MED ORDER — DIPHENHYDRAMINE HCL 12.5 MG/5ML PO ELIX
12.5000 mg | ORAL_SOLUTION | Freq: Four times a day (QID) | ORAL | Status: DC | PRN
Start: 2015-01-31 — End: 2015-02-01
  Filled 2015-01-31: qty 5

## 2015-01-31 MED ORDER — SODIUM CHLORIDE 0.9 % IJ SOLN: 9.0000 mL | INTRAMUSCULAR | Status: DC | PRN

## 2015-01-31 MED ORDER — SODIUM CHLORIDE 0.9 % IJ SOLN
10.0000 mL | INTRAMUSCULAR | Status: DC | PRN
  Administered 2015-02-04: 10 mL
  Filled 2015-01-31: qty 40

## 2015-01-31 MED ORDER — ALUM & MAG HYDROXIDE-SIMETH 200-200-20 MG/5ML PO SUSP: 15.0000 mL | ORAL | Status: DC | PRN

## 2015-01-31 MED ORDER — FOLIC ACID 1 MG PO TABS
1.0000 mg | ORAL_TABLET | Freq: Every morning | ORAL | Status: DC
  Administered 2015-02-01 – 2015-02-12 (×12): 1 mg via ORAL
  Filled 2015-01-31 (×12): qty 1

## 2015-01-31 MED ORDER — HYDROMORPHONE 1 MG/ML IV SOLN
INTRAVENOUS | Status: DC
  Administered 2015-01-31: via INTRAVENOUS
  Administered 2015-02-01: 3.3 mg via INTRAVENOUS
  Administered 2015-02-01: 4.2 mg via INTRAVENOUS
  Administered 2015-02-01: 6 mg via INTRAVENOUS
  Filled 2015-01-31: qty 25

## 2015-01-31 MED ORDER — ONDANSETRON HCL 4 MG/2ML IJ SOLN: 4.0000 mg | Freq: Four times a day (QID) | INTRAMUSCULAR | Status: DC | PRN

## 2015-01-31 MED ORDER — VITAMIN D3 25 MCG (1000 UNIT) PO TABS
2000.0000 [IU] | ORAL_TABLET | Freq: Every day | ORAL | Status: DC
  Administered 2015-02-01 – 2015-02-12 (×12): 2000 [IU] via ORAL
  Filled 2015-01-31 (×24): qty 2

## 2015-01-31 MED ORDER — METOPROLOL SUCCINATE ER 50 MG PO TB24
50.0000 mg | ORAL_TABLET | Freq: Every day | ORAL | Status: DC
  Administered 2015-02-01 – 2015-02-12 (×11): 50 mg via ORAL
  Filled 2015-01-31 (×12): qty 1

## 2015-01-31 MED ORDER — DEXTROSE-NACL 5-0.45 % IV SOLN
INTRAVENOUS | Status: DC
  Administered 2015-01-31 – 2015-02-03 (×5): via INTRAVENOUS
  Administered 2015-02-03: 75 mL via INTRAVENOUS
  Administered 2015-02-04 – 2015-02-09 (×7): via INTRAVENOUS

## 2015-01-31 NOTE — ED Notes (Signed)
Pt complaint of back pain related to SCC.

## 2015-01-31 NOTE — ED Notes (Signed)
Blood bank called and reported that pt has an antibody hx. She stated that this will delay the blood being ready.

## 2015-01-31 NOTE — ED Notes (Signed)
2 L initiated due to SpO2 < 94%

## 2015-01-31 NOTE — ED Notes (Signed)
Pt request that nurse access port

## 2015-01-31 NOTE — ED Notes (Addendum)
Orders below clicked off on accident by this user, pt wants labs drawn from his port

## 2015-01-31 NOTE — ED Provider Notes (Signed)
CSN: [id number]     Arrival date & time 01/31/15  1704 History   First MD Initiated Contact with Patient 01/31/15 1727     Chief Complaint  Patient presents with  . Sickle Cell Pain Crisis     (Consider location/radiation/quality/duration/timing/severity/associated sxs/prior Treatment) HPI 34 year old male with history of hemoglobin SS, hypertension, prior history of avascular necrosis of the right hip, PE and acute chest syndrome who presents with sickle cell crisis pain over past few days. Has history of PAH on 3-4L home oxygen. States compliance with home oxygen, but arrives to ED without oxygen on today.  Has been taking oral Dilaudid and MS Contin at home, and had flareup of his typical sickle cell pain. Pain localized over the left lower chest wall radiating towards his back. Not associated with fevers, chills, cough, n/v/d, abd pain, or urinary complaints. States with walking, he does feel sob and tired, needing to rest.  Past Medical History  Diagnosis Date  . Sickle cell anemia (HCC)   . Blood transfusion   . Acute embolism and thrombosis of right internal jugular vein ([place])   . Hypokalemia   . Mood disorder ([place])   . History of pulmonary embolus (PE)   . Avascular necrosis (HCC)     Right Hip  . Leukocytosis     Chronic  . Thrombocytosis (HCC)     Chronic  . Hypertension   . History of Clostridium difficile infection   . Uses marijuana   . Chronic anticoagulation   . Functional asplenia   . Former smoker   . Second hand tobacco smoke exposure   . Alcohol consumption of one to four drinks per day   . Noncompliance with medication regimen   . Sickle-cell crisis with associated acute chest syndrome ([place]) 05/13/2013  . Acute chest syndrome ([place]) 06/18/2013  . Demand ischemia ([place]) 01/02/2014  . Pulmonary hypertension [facility])    Past Surgical History  Procedure Laterality Date  . Right hip replacement      08/2006  . Cholecystectomy      01/2008  . Porta cath placement     . Porta cath removal    . Umbilical hernia repair      01/2008  . Excision of left periauricular cyst      10/2009  . Excision of right ear lobe cyst with primary closur      11/2007  . Portacath placement  01/05/2012    Procedure: INSERTION PORT-A-CATH;  Surgeon: Odis Hollingshead, MD;  Location: [facility];  Service: General;  Laterality: N/A;  ultrasound guiced port a cath insertion with fluoroscopy   Family History  Problem Relation Age of Onset  . Sickle cell trait Mother   . Depression Mother   . Diabetes Mother   . Sickle cell trait Father   . Sickle cell trait Brother    Social History  Substance Use Topics  . Smoking status: Former Smoker -- 13 years  . Smokeless tobacco: Never Used  . Alcohol Use: No    Review of Systems 10/14 systems reviewed and are negative other than those stated in the HPI    Allergies  Review of patient's allergies indicates no known allergies.  Home Medications   Prior to Admission medications   Medication Sig Start Date End Date Taking? Authorizing Provider  aspirin 81 MG chewable tablet Chew 1 tablet (81 mg total) by mouth daily. 07/23/14  Yes [name], MD  Cholecalciferol (VITAMIN D) 2000 UNITS tablet Take 1  tablet (2,000 Units total) by mouth daily. 02/13/14  Yes [name], MD  enoxaparin (LOVENOX) 120 MG/0.8ML injection Inject 0.8 mLs (120 mg total) into the skin daily. 12/03/14  Yes [name], MD  folic acid (FOLVITE) 1 MG tablet Take 1 tablet (1 mg total) by mouth every morning. 10/08/13  Yes [name], MD  HYDROmorphone (DILAUDID) 4 MG tablet Take 1 tablet (4 mg total) by mouth every 4 (four) hours as needed for severe pain. 01/29/15  Yes Dorena Dew, FNP  hydroxyurea (HYDREA) 500 MG capsule Take 1 capsule (500 mg total) by mouth daily. May take with food to minimize GI side effects. 11/06/14  Yes Dorena Dew, FNP  lisinopril (PRINIVIL,ZESTRIL) 10 MG tablet Take 1 tablet (10 mg total) by  mouth daily. 01/22/14  Yes [name], MD  metoprolol succinate (TOPROL-XL) 50 MG 24 hr tablet Take 1 tablet (50 mg total) by mouth daily. 11/28/14  Yes [name], MD  morphine (MS CONTIN) 30 MG 12 hr tablet Take 1 tablet (30 mg total) by mouth every 12 (twelve) hours. 01/29/15  Yes Dorena Dew, FNP  potassium chloride SA (K-DUR,KLOR-CON) 20 MEQ tablet Take 1 tablet (20 mEq total) by mouth every morning. 06/09/14  Yes [name], MD  zolpidem (AMBIEN) 10 MG tablet Take 1 tablet (10 mg total) by mouth at bedtime as needed for sleep. 01/13/15  Yes Dorena Dew, FNP   BP 104/64 mmHg  Pulse 85  Temp(Src) 98.7 F (37.1 C) (Oral)  Resp 16  Ht 6' (1.829 m)  Wt 165 lb (74.844 kg)  BMI 22.37 kg/m2  SpO2 97% Physical Exam Physical Exam  Nursing note and vitals reviewed. Constitutional: Well developed, well nourished, non-toxic, and in no acute distress Head: Normocephalic and atraumatic.  Mouth/Throat: Oropharynx is clear. Mucous membranes dry.  Neck: Normal range of motion. Neck supple.  Cardiovascular: Tachycardic rate and regular rhythm.   Pulmonary/Chest: Effort normal and breath sounds normal. Left chest wall tenderness. Abdominal: Soft. There is no tenderness. There is no rebound and no guarding.  Musculoskeletal: Normal range of motion.  Neurological: Alert, no facial droop, fluent speech, moves all extremities symmetrically Skin: Skin is warm and dry.  Psychiatric: Cooperative  ED Course  Procedures (including critical care time) Labs Review Labs Reviewed  COMPREHENSIVE METABOLIC PANEL - Abnormal; Notable for the following:    Glucose, Bld 119 (*)    Calcium 8.8 (*)    AST 46 (*)    Total Bilirubin 3.9 (*)    All other components within normal limits  CBC WITH DIFFERENTIAL/PLATELET - Abnormal; Notable for the following:    WBC 18.0 (*)    RBC 2.11 (*)    Hemoglobin 6.2 (*)    HCT 18.5 (*)    RDW 21.7 (*)    Platelets 521 (*)    Neutro  Abs 11.7 (*)    Monocytes Absolute 2.0 (*)    Basophils Absolute 0.2 (*)    All other components within normal limits  RETICULOCYTES - Abnormal; Notable for the following:    Retic Ct Pct 12.8 (*)    RBC. 2.11 (*)    Retic Count, Manual 270.1 (*)    All other components within normal limits  TYPE AND SCREEN  PREPARE RBC (CROSSMATCH)    Imaging Review Dg Chest 2 View  01/31/2015  CLINICAL DATA:  Patient with sickle cell disease and acute chest pain. Left rib pain. EXAM: CHEST  2  VIEW COMPARISON:  Chest radiograph 01/26/2015. FINDINGS: Monitoring leads overlie the patient. Left anterior chest wall Port-A-Cath is present with tip projecting over the superior cavoatrial junction, stable. Stable cardiomegaly. Unchanged bilateral coarse interstitial pulmonary opacities. No new area of superimposed pulmonary consolidation. No pleural effusion or pneumothorax. Regional skeleton is unremarkable. Cholecystectomy clips. IMPRESSION: No active cardiopulmonary disease. Electronically Signed   By: Lovey Newcomer M.D.   On: 01/31/2015 18:37   I have personally reviewed and evaluated these images and lab results as part of my medical decision-making.   EKG Interpretation   Date/Time:  Saturday January 31 2015 18:51:09 EST Ventricular Rate:  95 PR Interval:  193 QRS Duration: 112 QT Interval:  392 QTC Calculation: 493 R Axis:   -76 Text Interpretation:  Sinus tachycardia Multiform ventricular premature  complexes Prominent P waves, nondiagnostic Borderline IVCD with LAD  Probable RVH w/ secondary repol abnormality Inferior infarct, old Abnormal  lateral Q waves Prolonged QT interval No significant change since last  tracing Confirmed by Husna Krone MD, Aurilla Coulibaly [phone]) on 01/31/2015 8:35:38 PM      CRITICAL CARE Performed by: Forde Dandy   Total critical care time: 30 minutes  Critical care time was exclusive of separately billable procedures and treating other patients.  Critical care was necessary  to treat or prevent imminent or life-threatening deterioration.  Critical care was time spent personally by me on the following activities: development of treatment plan with patient and/or surrogate as well as nursing, discussions with consultants, evaluation of patient's response to treatment, examination of patient, obtaining history from patient or surrogate, ordering and performing treatments and interventions, ordering and review of laboratory studies, ordering and review of radiographic studies, pulse oximetry and re-evaluation of patient's condition.  MDM   Final diagnoses:  Symptomatic anemia  Sickle cell crisis (HCC)  PAH (pulmonary artery hypertension) ([place])    34 year old male with history of hemoglobin SS complicated by PE on Lovenox, right hip avascular necrosis, prior history of acute chest syndrome, and pulmonary arterial hypertension on 3-4 L home oxygen who presents with left-sided chest pain typical of his usual sickle cell crises. Mildly tachycardic in the low 100s on presentation, afebrile, normotensive, and without hypoxia when placed on home 3-4 L oxygen. Does appear dry on exam. He has anemia of 6.2 hemoglobin, and he states that his baseline is typically around 7-8. On chart review, he was anemic with hemoglobin around 6 during admission last month, which he became symptomatic requiring transfusion. His transfuse 1 unit of blood. He has appropriate reticulocytosis. His chest x-ray shows no acute cardiopulmonary processes. His EKG is unchanged from prior. Is placed on half normal saline for rehydration. Persistent 7-8 out of 10 pain despite multiple doses of Dilaudid. We'll admit for treatment of symptomatic anemia and sickle cell crisis. Discussed with Dr. Lenoria Chime, MD 01/31/15 2118

## 2015-01-31 NOTE — H&P (Signed)
[facility] Admission History and Physical       [name] [id number] DOB: [birth year] DOA: 01/31/2015  Referring physician: EDP PCP: [name], MD  Specialists:   Chief Complaint: Pain on His Sides  HPI: [name] is a 34 y.o. male with a history of Sickle Cell Anemia, PAH, Chronic Pain, and Recent Pulmonary Embolism who presents to the ED with complaints of 10/10 pain in both sides of his Chest and ABD x 3 days.  He has had increased SOB, and has NCO2 at home that is PRN.   The pain has not been controlled with his pain regimen.   He was evaluated and administered multiple doses of IV Dilaudid with only mild improvement and was referred for admission.  His hemoglobin level was  6.2, and a transfusion of 1 unit was ordered.  He was referred for admission.     eview of Systems:  Constitutional: No Weight Loss, No Weight Gain, Night Sweats, Fevers, Chills, Dizziness, Light Headedness, Fatigue, or Generalized Weakness HEENT: No Headaches, Difficulty Swallowing,Tooth/Dental Problems,Sore Throat,  No Sneezing, Rhinitis, Ear Ache, Nasal Congestion, or Post Nasal Drip,  Cardio-vascular:  +Chest pain, Orthopnea, PND, Edema in Lower Extremities, Anasarca, Dizziness, Palpitations  Resp: No Dyspnea, No DOE, No Productive Cough, No Non-Productive Cough, No Hemoptysis, No Wheezing.    GI: No Heartburn, Indigestion, +Abdominal Pain, Nausea, Vomiting, Diarrhea, Constipation, Hematemesis, Hematochezia, Melena, Change in Bowel Habits,  Loss of Appetite  GU: No Dysuria, No Change in Color of Urine, No Urgency or Urinary Frequency, No Flank pain.  Musculoskeletal: No Joint Pain or Swelling, No Decreased Range of Motion, No Back Pain.  Neurologic: No Syncope, No Seizures, Muscle Weakness, Paresthesia, Vision Disturbance or Loss, No Diplopia, No Vertigo, No Difficulty Walking,  Skin: No Rash or Lesions. Psych: No Change in Mood or Affect, No Depression or Anxiety, No  Memory loss, No Confusion, or Hallucinations   Past Medical History  Diagnosis Date  . Sickle cell anemia (HCC)   . Blood transfusion   . Acute embolism and thrombosis of right internal jugular vein ([place])   . Hypokalemia   . Mood disorder ([place])   . History of pulmonary embolus (PE)   . Avascular necrosis (HCC)     Right Hip  . Leukocytosis     Chronic  . Thrombocytosis (HCC)     Chronic  . Hypertension   . History of Clostridium difficile infection   . Uses marijuana   . Chronic anticoagulation   . Functional asplenia   . Former smoker   . Second hand tobacco smoke exposure   . Alcohol consumption of one to four drinks per day   . Noncompliance with medication regimen   . Sickle-cell crisis with associated acute chest syndrome ([place]) 05/13/2013  . Acute chest syndrome ([place]) 06/18/2013  . Demand ischemia ([place]) 01/02/2014  . Pulmonary hypertension [facility])      Past Surgical History  Procedure Laterality Date  . Right hip replacement      08/2006  . Cholecystectomy      01/2008  . Porta cath placement    . Porta cath removal    . Umbilical hernia repair      01/2008  . Excision of left periauricular cyst      10/2009  . Excision of right ear lobe cyst with primary closur      11/2007  . Portacath placement  01/05/2012    Procedure: INSERTION PORT-A-CATH;  Surgeon: Odis Hollingshead, MD;  Location: [facility] OR;  Service: General;  Laterality: N/A;  ultrasound guiced port a cath insertion with fluoroscopy      Prior to Admission medications   Medication Sig Start Date End Date Taking? Authorizing Provider  aspirin 81 MG chewable tablet Chew 1 tablet (81 mg total) by mouth daily. 07/23/14  Yes [name], MD  Cholecalciferol (VITAMIN D) 2000 UNITS tablet Take 1 tablet (2,000 Units total) by mouth daily. 02/13/14  Yes [name], MD  enoxaparin (LOVENOX) 120 MG/0.8ML injection Inject 0.8 mLs (120 mg total) into the skin daily. 12/03/14  Yes [name], MD    folic acid (FOLVITE) 1 MG tablet Take 1 tablet (1 mg total) by mouth every morning. 10/08/13  Yes [name], MD  HYDROmorphone (DILAUDID) 4 MG tablet Take 1 tablet (4 mg total) by mouth every 4 (four) hours as needed for severe pain. 01/29/15  Yes Dorena Dew, FNP  hydroxyurea (HYDREA) 500 MG capsule Take 1 capsule (500 mg total) by mouth daily. May take with food to minimize GI side effects. 11/06/14  Yes Dorena Dew, FNP  lisinopril (PRINIVIL,ZESTRIL) 10 MG tablet Take 1 tablet (10 mg total) by mouth daily. 01/22/14  Yes [name], MD  metoprolol succinate (TOPROL-XL) 50 MG 24 hr tablet Take 1 tablet (50 mg total) by mouth daily. 11/28/14  Yes [name], MD  morphine (MS CONTIN) 30 MG 12 hr tablet Take 1 tablet (30 mg total) by mouth every 12 (twelve) hours. 01/29/15  Yes Dorena Dew, FNP  potassium chloride SA (K-DUR,KLOR-CON) 20 MEQ tablet Take 1 tablet (20 mEq total) by mouth every morning. 06/09/14  Yes [name], MD  zolpidem (AMBIEN) 10 MG tablet Take 1 tablet (10 mg total) by mouth at bedtime as needed for sleep. 01/13/15  Yes Dorena Dew, FNP     No Known Allergies  Social History:  reports that he has quit smoking. He has never used smokeless tobacco. He reports that he uses illicit drugs (Marijuana) about twice per week. He reports that he does not drink alcohol.    Family History  Problem Relation Age of Onset  . Sickle cell trait Mother   . Depression Mother   . Diabetes Mother   . Sickle cell trait Father   . Sickle cell trait Brother        Physical Exam:  GEN:  Pleasant Thin Well Devloped 34 y.o. African American male examined and in no acute distress; cooperative with exam Filed Vitals:   01/31/15 1944 01/31/15 1947 01/31/15 2024 01/31/15 2054  BP: 110/61   104/64  Pulse: 92  94 85  Temp:      TempSrc:      Resp: 20  15 16   Height:      Weight:      SpO2: 90% 95% 99% 97%   Blood pressure 104/64, pulse  85, temperature 98.7 F (37.1 C), temperature source Oral, resp. rate 16, height 6' (1.829 m), weight 74.844 kg (165 lb), SpO2 97 %. PSYCH: He is alert and oriented x4; does not appear anxious does not appear depressed; affect is normal HEENT: Normocephalic and Atraumatic, Mucous membranes pink; PERRLA; EOM intact; Fundi:  Benign;  No scleral icterus, Nares: Patent, Oropharynx: Clear, Fair Dentition,    Neck:  FROM, No Cervical Lymphadenopathy nor Thyromegaly or Carotid Bruit; No JVD; Breasts:: Not examined CHEST WALL: No tenderness CHEST: Normal respiration, clear to auscultation bilaterally HEART: Irregular rate and  rhythm; no murmurs rubs or gallops BACK: No kyphosis or scoliosis; No CVA tenderness ABDOMEN: Positive Bowel Sounds, Soft Non-Tender, No Rebound or Guarding; No Masses, No Organomegaly Rectal Exam: Not done EXTREMITIES: No Cyanosis, Clubbing, or Edema; No Ulcerations. Genitalia: not examined PULSES: 2+ and symmetric SKIN: Normal hydration no rash or ulceration CNS:  Alert and Oriented x 4, No Focal Deficits Vascular: pulses palpable throughout    Labs on Admission:  Basic Metabolic Panel:  Recent Labs Lab 01/26/15 0134 01/27/15 0342 01/31/15 1859  NA 141 138 139  K 3.5 3.9 4.0  CL 111 109 107  CO2 23 21* 23  GLUCOSE 98 130* 119*  BUN 12 14 20   CREATININE 0.56* 0.74 1.06  CALCIUM 8.8* 8.7* 8.8*   Liver Function Tests:  Recent Labs Lab 01/26/15 0134 01/27/15 0342 01/31/15 1859  AST 38 37 46*  ALT 21 19 33  ALKPHOS 70 85 86  BILITOT 6.5* 4.7* 3.9*  PROT 7.3 7.2 7.2  ALBUMIN 4.2 4.0 4.1   No results for input(s): LIPASE, AMYLASE in the last 168 hours. No results for input(s): AMMONIA in the last 168 hours. CBC:  Recent Labs Lab 01/26/15 0134 01/27/15 0342 01/31/15 1859  WBC 21.3* 16.6* 18.0*  NEUTROABS 14.9* 10.6* 11.7*  HGB 7.4* 7.0* 6.2*  HCT 21.3* 20.1* 18.5*  MCV 86.6 85.9 87.7  PLT 499* 454* 521*   Cardiac Enzymes: No results for  input(s): CKTOTAL, CKMB, CKMBINDEX, TROPONINI in the last 168 hours.  BNP (last 3 results)  Recent Labs  08/29/14 0810 09/10/14 0612 11/30/14 0910  BNP 1117.0* 549.5* 639.7*    ProBNP (last 3 results) No results for input(s): PROBNP in the last 8760 hours.  CBG: No results for input(s): GLUCAP in the last 168 hours.  Radiological Exams on Admission: Dg Chest 2 View  01/31/2015  CLINICAL DATA:  Patient with sickle cell disease and acute chest pain. Left rib pain. EXAM: CHEST  2 VIEW COMPARISON:  Chest radiograph 01/26/2015. FINDINGS: Monitoring leads overlie the patient. Left anterior chest wall Port-A-Cath is present with tip projecting over the superior cavoatrial junction, stable. Stable cardiomegaly. Unchanged bilateral coarse interstitial pulmonary opacities. No new area of superimposed pulmonary consolidation. No pleural effusion or pneumothorax. Regional skeleton is unremarkable. Cholecystectomy clips. IMPRESSION: No active cardiopulmonary disease. Electronically Signed   By: Lovey Newcomer M.D.   On: 01/31/2015 18:37     EKG: Independently reviewed.         Assessment/Plan:       34 y.o. male with  Principal Problem:   1.     Sickle cell crisis (HCC)   Sickle Cell Protocol   PCA Dilaudid for Pain   Cardiac Monitoring   Active Problems:     2.      Anemia   Transfuse 1 Unit    3.     Embolism, pulmonary with infarction (HCC)   Continue Full dose Lovenox Rx    4.    Chronic respiratory failure with hypoxia (HCC)   NCO2   Monitor O2 sats      5.    Chronic anticoagulation   Continue Full Dose Lovenox    6.    Essential hypertension   Continue Toprol, Lisinopril   Monitor BPs    7.    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    Chronic atrial fibrillation (HCC) due to PAH/CorPulmonale   Cardiac monitoring   Continue Toprol Rx   Continue Full  dose Lovenox    9.     DVT Prophylaxis   Full dose Lovenox    Code Status:     FULL CODE       Family Communication:  No Family Present    Disposition Plan:    Inpatient Status        Time spent:  [facility] Pager [phone]   If [place] Please Contact the Day Rounding Team MD for [facility]  If 7PM-7AM, Please Contact Night-Floor Coverage  [email or web address] Password [facility] 01/31/2015, 9:33 PM     ADDENDUM:   Patient was seen and examined on 01/31/2015

## 2015-02-01 LAB — CBC
HEMATOCRIT: 19.7 % — AB (ref 39.0–52.0)
Hemoglobin: 6.8 g/dL — CL (ref 13.0–17.0)
MCH: 30.4 pg (ref 26.0–34.0)
MCHC: 34.5 g/dL (ref 30.0–36.0)
MCV: 87.9 fL (ref 78.0–100.0)
Platelets: 497 10*3/uL — ABNORMAL HIGH (ref 150–400)
RBC: 2.24 MIL/uL — ABNORMAL LOW (ref 4.22–5.81)
RDW: 20.5 % — AB (ref 11.5–15.5)
WBC: 18.6 10*3/uL — ABNORMAL HIGH (ref 4.0–10.5)

## 2015-02-01 MED ORDER — ONDANSETRON HCL 4 MG/2ML IJ SOLN
4.0000 mg | Freq: Four times a day (QID) | INTRAMUSCULAR | Status: DC | PRN
Start: 2015-02-01 — End: 2015-02-12

## 2015-02-01 MED ORDER — SODIUM CHLORIDE 0.9 % IJ SOLN: 9.0000 mL | INTRAMUSCULAR | Status: DC | PRN

## 2015-02-01 MED ORDER — HYDROMORPHONE 1 MG/ML IV SOLN
INTRAVENOUS | Status: DC
  Administered 2015-02-01: 20:00:00 via INTRAVENOUS
  Administered 2015-02-01: 7 mg via INTRAVENOUS
  Administered 2015-02-01: 1.4 mg via INTRAVENOUS
  Administered 2015-02-02: 1 mg via INTRAVENOUS
  Administered 2015-02-02: 6.3 mg via INTRAVENOUS
  Administered 2015-02-02: 7 mg via INTRAVENOUS
  Administered 2015-02-02: 3.5 mg via INTRAVENOUS
  Administered 2015-02-02: 7 mg via INTRAVENOUS
  Administered 2015-02-02: 6.3 mg via INTRAVENOUS
  Administered 2015-02-02: 5.6 mg via INTRAVENOUS
  Administered 2015-02-03: 7.7 mg via INTRAVENOUS
  Administered 2015-02-03: 2.8 mg via INTRAVENOUS
  Administered 2015-02-03: 05:00:00 via INTRAVENOUS
  Administered 2015-02-03: 11.9 mg via INTRAVENOUS
  Administered 2015-02-03: 7 mg via INTRAVENOUS
  Administered 2015-02-03: 2.8 mg via INTRAVENOUS
  Administered 2015-02-03: 22:00:00 via INTRAVENOUS
  Administered 2015-02-04: 8.5 mg via INTRAVENOUS
  Administered 2015-02-04: 4.2 mg via INTRAVENOUS
  Administered 2015-02-04: 4.9 mg via INTRAVENOUS
  Administered 2015-02-04: via INTRAVENOUS
  Administered 2015-02-04: 7.7 mg via INTRAVENOUS
  Administered 2015-02-04: 09:00:00 via INTRAVENOUS
  Administered 2015-02-04: 10.5 mg via INTRAVENOUS
  Administered 2015-02-05: 18:00:00 via INTRAVENOUS
  Administered 2015-02-05: 5.6 mg via INTRAVENOUS
  Administered 2015-02-05: 4.2 mg via INTRAVENOUS
  Administered 2015-02-05: 8.6 mg via INTRAVENOUS
  Administered 2015-02-05: 9.6 mg via INTRAVENOUS
  Administered 2015-02-05: 2.1 mg via INTRAVENOUS
  Administered 2015-02-05: 16.1 mg via INTRAVENOUS
  Administered 2015-02-06: 4.9 mg via INTRAVENOUS
  Administered 2015-02-06: 12.69 mg via INTRAVENOUS
  Administered 2015-02-06: 10.8 mg via INTRAVENOUS
  Administered 2015-02-06: 21:00:00 via INTRAVENOUS
  Administered 2015-02-06: 2.8 mg via INTRAVENOUS
  Administered 2015-02-06: 11:00:00 via INTRAVENOUS
  Administered 2015-02-06: 3.48 mg via INTRAVENOUS
  Administered 2015-02-07: 8.7 mg via INTRAVENOUS
  Administered 2015-02-07 (×2): via INTRAVENOUS
  Administered 2015-02-07: 9.8 mg via INTRAVENOUS
  Administered 2015-02-07: 7 mg via INTRAVENOUS
  Administered 2015-02-07: 8.27 mg via INTRAVENOUS
  Administered 2015-02-07: 8.3 mg via INTRAVENOUS
  Administered 2015-02-07: 13.3 mg via INTRAVENOUS
  Administered 2015-02-08: 14.6 mg via INTRAVENOUS
  Administered 2015-02-08: 05:00:00 via INTRAVENOUS
  Administered 2015-02-08: 4.8 mg via INTRAVENOUS
  Administered 2015-02-08: 9.1 mg via INTRAVENOUS
  Administered 2015-02-08: 10.5 mg via INTRAVENOUS
  Administered 2015-02-08: 7 mg via INTRAVENOUS
  Administered 2015-02-08: 4.9 mg via INTRAVENOUS
  Administered 2015-02-08: 18:00:00 via INTRAVENOUS
  Administered 2015-02-08: 9.7 mg via INTRAVENOUS
  Administered 2015-02-09: 9.1 mg via INTRAVENOUS
  Administered 2015-02-09: 04:00:00 via INTRAVENOUS
  Administered 2015-02-09: 5 mg via INTRAVENOUS
  Administered 2015-02-09: 0 mg via INTRAVENOUS
  Filled 2015-02-01 (×14): qty 25

## 2015-02-01 MED ORDER — DIPHENHYDRAMINE HCL 12.5 MG/5ML PO ELIX
12.5000 mg | ORAL_SOLUTION | Freq: Four times a day (QID) | ORAL | Status: DC | PRN
  Filled 2015-02-01: qty 5

## 2015-02-01 MED ORDER — MORPHINE SULFATE ER 30 MG PO TBCR
30.0000 mg | EXTENDED_RELEASE_TABLET | Freq: Two times a day (BID) | ORAL | Status: DC
  Administered 2015-02-01 – 2015-02-12 (×23): 30 mg via ORAL
  Filled 2015-02-01 (×23): qty 1

## 2015-02-01 MED ORDER — ZOLPIDEM TARTRATE 10 MG PO TABS
10.0000 mg | ORAL_TABLET | Freq: Every evening | ORAL | Status: DC | PRN
  Administered 2015-02-01 – 2015-02-08 (×8): 10 mg via ORAL
  Filled 2015-02-01 (×8): qty 1

## 2015-02-01 MED ORDER — NALOXONE HCL 0.4 MG/ML IJ SOLN: 0.4000 mg | INTRAMUSCULAR | Status: DC | PRN

## 2015-02-01 MED ORDER — KETOROLAC TROMETHAMINE 30 MG/ML IJ SOLN
30.0000 mg | Freq: Four times a day (QID) | INTRAMUSCULAR | Status: DC
  Administered 2015-02-01 – 2015-02-05 (×16): 30 mg via INTRAVENOUS
  Filled 2015-02-01 (×16): qty 1

## 2015-02-01 MED ORDER — SODIUM CHLORIDE 0.9 % IV SOLN
12.5000 mg | Freq: Four times a day (QID) | INTRAVENOUS | Status: DC | PRN
  Administered 2015-02-01 – 2015-02-12 (×11): 12.5 mg via INTRAVENOUS
  Filled 2015-02-01 (×13): qty 0.25

## 2015-02-01 NOTE — Progress Notes (Signed)
[name] DOB: 04-04-1980 DOA: 01/31/2015 PCP: [name], MD  Brief narrative:  35 y/o ? 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 2/2 to PE-also has NYHA diastolic HF Chr ETOH habituation 1-4 drinks /d Insomnia Prior Avascular necrosis s/p THR 08/2006 Htn Prior Cdiff  Admitted to [facility] hospital 12/31 after short stay at [facility] day center for acute painful crisis States he wasn't able to pick up his oral pain regimen as it was too early for him to call for those meds and this is why his pain has not been controlled   Past medical history-As per Problem list Chart reviewed as below-   Consultants:    Procedures:    Antibiotics:     Subjective   No fever no chills no dark/tarry stool Pain rated 7-8/10 but hasn;t received his MS contin since admit-only is on PCA No cough no cold    Objective    Interim History:   Telemetry: nsr   Objective: Filed Vitals:   02/01/15 0157 02/01/15 0357 02/01/15 0400 02/01/15 0800  BP: 102/63  112/66   Pulse: 84  86   Temp: 98.3 F (36.8 C)  98.5 F (36.9 C)   TempSrc: Oral  Oral   Resp: 20 16 18 16   Height:      Weight:      SpO2: 96% 95% 95% 97%    Intake/Output Summary (Last 24 hours) at 02/01/15 0956 Last data filed at 02/01/15 0937  Gross per 24 hour  Intake   1295 ml  Output    950 ml  Net    345 ml    Exam:  General: eomi ncat in nad.  No ict Cardiovascular: s1 s 2 slightly tachy cardic Respiratory: no rales nor rhonchi Abdomen: soft nt nd no reboudn Skin intact LE's no edema Neuro neruo intact, moves all 4 limbs  Data Reviewed: Basic Metabolic Panel:  Recent Labs Lab 01/26/15 0134 01/27/15 0342 01/31/15 1859  NA 141 138 139  K 3.5 3.9 4.0  CL 111 109 107  CO2 23 21* 23  GLUCOSE 98 130* 119*  BUN 12 14 20   CREATININE 0.56* 0.74 1.06  CALCIUM 8.8* 8.7* 8.8*   Liver Function Tests:  Recent Labs Lab 01/26/15 0134 01/27/15 0342  01/31/15 1859  AST 38 37 46*  ALT 21 19 33  ALKPHOS 70 85 86  BILITOT 6.5* 4.7* 3.9*  PROT 7.3 7.2 7.2  ALBUMIN 4.2 4.0 4.1   No results for input(s): LIPASE, AMYLASE in the last 168 hours. No results for input(s): AMMONIA in the last 168 hours. CBC:  Recent Labs Lab 01/26/15 0134 01/27/15 0342 01/31/15 1859 02/01/15 0710  WBC 21.3* 16.6* 18.0* 18.6*  NEUTROABS 14.9* 10.6* 11.7*  --   HGB 7.4* 7.0* 6.2* 6.8*  HCT 21.3* 20.1* 18.5* 19.7*  MCV 86.6 85.9 87.7 87.9  PLT 499* 454* 521* 497*   Cardiac Enzymes: No results for input(s): CKTOTAL, CKMB, CKMBINDEX, TROPONINI in the last 168 hours. BNP: Invalid input(s): POCBNP CBG: No results for input(s): GLUCAP in the last 168 hours.  No results found for this or any previous visit (from the past 240 hour(s)).   Studies:              All Imaging reviewed and is as per above notation   Scheduled Meds: . sodium chloride  10 mL/hr Intravenous Once  . aspirin  81 mg Oral Daily  .  cholecalciferol  2,000 Units Oral Daily  . enoxaparin  120 mg Subcutaneous Q24H  . folic acid  1 mg Oral q morning - 10a  . HYDROmorphone   Intravenous 6 times per day  . hydroxyurea  500 mg Oral Daily  . ketorolac  30 mg Intravenous 4 times per day  . lisinopril  10 mg Oral Daily  . metoprolol succinate  50 mg Oral Daily  . morphine  30 mg Oral Q12H  . potassium chloride SA  20 mEq Oral q morning - 10a  . senna-docusate  1 tablet Oral BID   Continuous Infusions: . dextrose 5 % and 0.45% NaCl 75 mL/hr at 01/31/15 2300     Assessment/Plan:  1. Acute painful crisis 2/2 to SCC-increase D5 rate to 100 cc/hr.  Restart home [place] contin dose.  Continue high dose PCA.  Schedule Toradol 30 q6 x 4 doses and monitor pain levels.  Add Kpad.  Ambulate early 2. SCC disease + anemia-received 1 U PRBC overnight 12/31 . Consider when able LDH, HBSS % to see burden of sickle disease .  rpt cbc and Cmet in am. Retic count is 270.  Likely Hb will re-equilibrate in  am with transfusion.  Consider holding Hydrea in face of anemia if persists tomorrow.  continue Folic Acid 1 daily 3. Pulm Htn and diastolic HF-monitor on tele-would give d5/0.45% @ 100cc/h  for now to prevent sickling.  Monitor I/o in am.  continue Metoprolol 50 qd, Lisinopril 10 qd.  On chr Oxygen 3 liters at home for past 3-4 months and will need to continue this as OP 4. Prior PE and R-IJ thrombosis-currently on lovenox Therapeutic doses.  No dark stools. Given high risk of sickling and thrombosis would continue lifelong.  Also on ASA 81 which increases risk of bleeding.  Patient to notify if bloody stools.  Hemoccult stools.  Consider Iron supplements? 5. Prior AVN R hip-mobilise early-independant at home    Appt with PCP: not yet Code Status:  Full code Family Communication: nofamily + Disposition Plan: likely home when able DVT prophylaxis: Lovenox tx dosing  Verneita Griffes, MD  [facility] Pager [phone] 02/01/2015, 9:56 AM    LOS: 1 day

## 2015-02-02 DIAGNOSIS — D5700 Hb-SS disease with crisis, unspecified: Principal | ICD-10-CM

## 2015-02-02 LAB — COMPREHENSIVE METABOLIC PANEL
ALT: 31 U/L (ref 17–63)
ANION GAP: 6 (ref 5–15)
AST: 38 U/L (ref 15–41)
Albumin: 3.9 g/dL (ref 3.5–5.0)
Alkaline Phosphatase: 89 U/L (ref 38–126)
BUN: 16 mg/dL (ref 6–20)
CHLORIDE: 109 mmol/L (ref 101–111)
CO2: 23 mmol/L (ref 22–32)
Calcium: 8.5 mg/dL — ABNORMAL LOW (ref 8.9–10.3)
Creatinine, Ser: 0.73 mg/dL (ref 0.61–1.24)
Glucose, Bld: 106 mg/dL — ABNORMAL HIGH (ref 65–99)
POTASSIUM: 4.4 mmol/L (ref 3.5–5.1)
Sodium: 138 mmol/L (ref 135–145)
Total Bilirubin: 2.7 mg/dL — ABNORMAL HIGH (ref 0.3–1.2)
Total Protein: 6.9 g/dL (ref 6.5–8.1)

## 2015-02-02 LAB — CBC WITH DIFFERENTIAL/PLATELET
BASOS PCT: 1 %
Basophils Absolute: 0.2 10*3/uL — ABNORMAL HIGH (ref 0.0–0.1)
EOS ABS: 1 10*3/uL — AB (ref 0.0–0.7)
Eosinophils Relative: 6 %
HCT: 19.1 % — ABNORMAL LOW (ref 39.0–52.0)
Hemoglobin: 6.4 g/dL — CL (ref 13.0–17.0)
LYMPHS ABS: 3.7 10*3/uL (ref 0.7–4.0)
Lymphocytes Relative: 22 %
MCH: 29.2 pg (ref 26.0–34.0)
MCHC: 33.5 g/dL (ref 30.0–36.0)
MCV: 87.2 fL (ref 78.0–100.0)
MONO ABS: 2.2 10*3/uL — AB (ref 0.1–1.0)
Monocytes Relative: 13 %
NEUTROS ABS: 9.9 10*3/uL — AB (ref 1.7–7.7)
NEUTROS PCT: 58 %
PLATELETS: 496 10*3/uL — AB (ref 150–400)
RBC: 2.19 MIL/uL — ABNORMAL LOW (ref 4.22–5.81)
RDW: 20.5 % — AB (ref 11.5–15.5)
WBC: 17 10*3/uL — ABNORMAL HIGH (ref 4.0–10.5)

## 2015-02-02 MED ORDER — HYDROMORPHONE HCL 2 MG/ML IJ SOLN
2.0000 mg | INTRAMUSCULAR | Status: DC | PRN
  Administered 2015-02-02 – 2015-02-03 (×6): 2 mg via INTRAVENOUS
  Filled 2015-02-02 (×6): qty 1

## 2015-02-02 MED ORDER — PNEUMOCOCCAL VAC POLYVALENT 25 MCG/0.5ML IJ INJ
0.5000 mL | INJECTION | INTRAMUSCULAR | Status: DC
  Filled 2015-02-02 (×2): qty 0.5

## 2015-02-02 NOTE — Plan of Care (Signed)
Problem: Phase I Progression Outcomes Goal: Pain controlled with appropriate interventions Outcome: Progressing Patient c/o pain 7/10 despite use of PCA. Provider to assess

## 2015-02-02 NOTE — Progress Notes (Signed)
[name] [id number] DOB: March 22, 1980 DOA: 01/31/2015 PCP: [name], MD  Brief narrative:  35 y/o ? 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 2/2 to PE-also has NYHA diastolic HF Chr ETOH habituation 1-4 drinks /d Insomnia Prior Avascular necrosis s/p THR 08/2006 Htn Prior Cdiff  Admitted to [facility] hospital 12/31 after short stay at [facility] day center for acute painful crisis States he wasn't able to pick up his oral pain regimen as it was too early for him to call for those meds and this is why his pain has not been controlled. Patient is currently having 9 out of 10 pain. He is on Dilaudid PCA and has used 52 mg with 62 demands and 60 deliveries. He is also on Toradol. Patient is extremely tolerant to opiates. He also has pulmonary hypertension requiring oxygen at 2 L/m at home. He is on telemetry but has no cardiac issues at this point. No cough no fever no nausea vomiting or diarrhea.   Past medical history-As per Problem list Chart reviewed as below-   Consultants:    Procedures:    Antibiotics:     Subjective   No fever no chills no dark/tarry stool Pain rated 8/10, No cough no cold    Objective    Interim History:   Telemetry: nsr   Objective: Filed Vitals:   02/02/15 0631 02/02/15 0824 02/02/15 0912 02/02/15 1033  BP: 92/62 109/66  107/70  Pulse: 70 76    Temp: 98.2 F (36.8 C) 98.2 F (36.8 C)    TempSrc: Oral Oral    Resp: 15 16 16    Height:      Weight:      SpO2: 96% 95% 96%     Intake/Output Summary (Last 24 hours) at 02/02/15 1039 Last data filed at 02/02/15 0900  Gross per 24 hour  Intake 3506.67 ml  Output   1050 ml  Net 2456.67 ml    Exam:  General: eomi ncat in nad.  No ict Cardiovascular: s1 s 2 slightly tachy cardic Respiratory: no rales nor rhonchi Abdomen: soft nt nd no reboudn Skin intact LE's no edema Neuro neruo intact, moves all 4 limbs  Data Reviewed: Basic Metabolic  Panel:  Recent Labs Lab 01/27/15 0342 01/31/15 1859 02/02/15 0655  NA 138 139 138  K 3.9 4.0 4.4  CL 109 107 109  CO2 21* 23 23  GLUCOSE 130* 119* 106*  BUN 14 20 16   CREATININE 0.74 1.06 0.73  CALCIUM 8.7* 8.8* 8.5*   Liver Function Tests:  Recent Labs Lab 01/27/15 0342 01/31/15 1859 02/02/15 0655  AST 37 46* 38  ALT 19 33 31  ALKPHOS 85 86 89  BILITOT 4.7* 3.9* 2.7*  PROT 7.2 7.2 6.9  ALBUMIN 4.0 4.1 3.9   No results for input(s): LIPASE, AMYLASE in the last 168 hours. No results for input(s): AMMONIA in the last 168 hours. CBC:  Recent Labs Lab 01/27/15 0342 01/31/15 1859 02/01/15 0710 02/02/15 0655  WBC 16.6* 18.0* 18.6* 17.0*  NEUTROABS 10.6* 11.7*  --  9.9*  HGB 7.0* 6.2* 6.8* 6.4*  HCT 20.1* 18.5* 19.7* 19.1*  MCV 85.9 87.7 87.9 87.2  PLT 454* 521* 497* 496*   Cardiac Enzymes: No results for input(s): CKTOTAL, CKMB, CKMBINDEX, TROPONINI in the last 168 hours. BNP: Invalid input(s): POCBNP CBG: No results for input(s): GLUCAP in the last 168 hours.  No results found for this or any previous visit (  from the past 240 hour(s)).   Studies:              All Imaging reviewed and is as per above notation   Scheduled Meds: . sodium chloride  10 mL/hr Intravenous Once  . aspirin  81 mg Oral Daily  . cholecalciferol  2,000 Units Oral Daily  . enoxaparin  120 mg Subcutaneous Q24H  . folic acid  1 mg Oral q morning - 10a  . HYDROmorphone   Intravenous 6 times per day  . hydroxyurea  500 mg Oral Daily  . ketorolac  30 mg Intravenous 4 times per day  . lisinopril  10 mg Oral Daily  . metoprolol succinate  50 mg Oral Daily  . morphine  30 mg Oral Q12H  . [START ON 02/03/2015] pneumococcal 23 valent vaccine  0.5 mL Intramuscular Tomorrow-1000  . potassium chloride SA  20 mEq Oral q morning - 10a  . senna-docusate  1 tablet Oral BID   Continuous Infusions: . dextrose 5 % and 0.45% NaCl 100 mL/hr at 02/02/15 0224     Assessment/Plan:  1. Acute  painful crisis: Patient is not getting full control. I will add physician assisted dosing at 2 mg every 3 hours for the next 24 hours. Continue PCA and Toradol. 2. SCC disease + anemia-received 1 U PRBC patient has responded and hemoglobin seems to be stable at this point. Continue Folic Acid 1 daily 3. Pulm HtN and diastolic HF-patient is stable and at baseline. I will discontinue telemetry but  continue Metoprolol 50 qd, Lisinopril 10 qd.  On chr Oxygen 2-3 liters at home for past 3-4 months and will need to continue this as OP 4. Prior PE and R-IJ thrombosis-currently on lovenox Therapeutic doses.  No dark stools. Given high risk of sickling and thrombosis would continue lifelong.  Also on ASA 81 which increases risk of bleeding.  Patient to notify if bloody stools. Patient has been doing better however 5. Prior AVN R hip-mobilise early-independant at home    Appt with PCP: not yet Code Status:  Full code Family Communication: nofamily + Disposition Plan: likely home when able DVT prophylaxis: Lovenox tx dosing  Barbette Merino, MD Pager [phone] 02/02/2015, 10:39 AM    LOS: 2 days

## 2015-02-03 LAB — MRSA PCR SCREENING: MRSA by PCR: POSITIVE — AB

## 2015-02-03 MED ORDER — CHLORHEXIDINE GLUCONATE CLOTH 2 % EX PADS
6.0000 | MEDICATED_PAD | Freq: Every day | CUTANEOUS | Status: AC
  Administered 2015-02-04 – 2015-02-08 (×5): 6 via TOPICAL

## 2015-02-03 MED ORDER — PANTOPRAZOLE SODIUM 40 MG PO TBEC
40.0000 mg | DELAYED_RELEASE_TABLET | Freq: Every day | ORAL | Status: DC
  Administered 2015-02-03 – 2015-02-12 (×10): 40 mg via ORAL
  Filled 2015-02-03 (×10): qty 1

## 2015-02-03 MED ORDER — MUPIROCIN 2 % EX OINT
1.0000 "application " | TOPICAL_OINTMENT | Freq: Two times a day (BID) | CUTANEOUS | Status: AC
  Administered 2015-02-03 – 2015-02-08 (×10): 1 via NASAL
  Filled 2015-02-03 (×3): qty 22

## 2015-02-03 MED ORDER — HYDROMORPHONE HCL 2 MG/ML IJ SOLN
2.0000 mg | INTRAMUSCULAR | Status: AC | PRN
  Administered 2015-02-03 (×2): 2 mg via INTRAVENOUS
  Filled 2015-02-03 (×2): qty 1

## 2015-02-03 MED ORDER — HYDROMORPHONE HCL 2 MG/ML IJ SOLN
2.0000 mg | Freq: Once | INTRAMUSCULAR | Status: AC
  Administered 2015-02-04: 2 mg via INTRAVENOUS
  Filled 2015-02-03: qty 1

## 2015-02-03 NOTE — Care Management Important Message (Signed)
Important Message  Patient Details  Name: [name] MRN: [id number] Date of Birth: [birth year]   Medicare Important Message Given:  Yes    [name] 02/03/2015, 2:31 [place] Message  Patient Details  Name: [name] MRN: [id number] Date of Birth: 1980-04-06   Medicare Important Message Given:  Yes    [name] 02/03/2015, 2:31 PM

## 2015-02-03 NOTE — Progress Notes (Addendum)
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 Acute Sickle cell pain crisis      -rates pain at 7-8/10 today, on Full dose PCA, IVF D51/2 NS at 75cc/hr      -Toradol, continue home dose MS contin dose, folic acid        -Add Kpad.  Ambulate early, stop IV dilaudid PRN  2. Sickle cell anemia-received 1 U PRBC overnight on 12/31      -baseline hb 6-7gm/dl , monitor Hb      -continue Folic Acid       -on full dose lovenox, no overt blood loss, monitor  3. Chronic resp failure/Pulm Htn and diastolic HF      -no evidence of volume overload, cut down IVf to 75c/hr      -on 3-4L Home O2 at baseline      -continue Metoprolol ,Lisinopril  4. Prior PE and R-IJ thrombosis-currently on Full dose lovenox,    5. Prior AVN R hip-mobilise early-independant at home  DVT prophylaxis: Lovenox tx dosing  Code Status:  Full code Family Communication: no family at bedside Disposition Plan: home in ?48hours   Subjective   feels better, rates pain at 7-8/10, has not used PRN dilaudid today, using full dose PCA    Objective     Objective: Filed Vitals:   02/03/15 0504 02/03/15 0519 02/03/15 0758 02/03/15 0814  BP:  114/72 104/73   Pulse:  80 76 72  Temp:  98.3 F (36.8 C) 98.3 F (36.8 C)   TempSrc:  Oral Oral   Resp: 16 16 15    Height:      Weight:      SpO2: 89% 91% 85% 94%    Intake/Output Summary (Last 24 hours) at 02/03/15 1207 Last data filed at 02/03/15 0900  Gross per 24 hour    Intake 4043.33 ml  Output   1250 ml  Net 2793.33 ml    Exam:  General:AAOx3, flat affect, no distress Cardiovascular: s1 s 2/RRR Respiratory: no rales nor rhonchi Abdomen: soft nt nd no reboudn Skin intact LE's no edema Neuro neruo intact, moves all 4 limbs  Data Reviewed: Basic Metabolic Panel:  Recent Labs Lab 01/31/15 1859 02/02/15 0655  NA 139 138  K 4.0 4.4  CL 107 109  CO2 23 23  GLUCOSE 119* 106*  BUN 20 16  CREATININE 1.06 0.73  CALCIUM 8.8* 8.5*   Liver Function Tests:  Recent Labs Lab 01/31/15 1859 02/02/15 0655  AST 46* 38  ALT 33 31  ALKPHOS 86 89  BILITOT 3.9* 2.7*  PROT 7.2 6.9  ALBUMIN 4.1 3.9   No results for input(s): LIPASE, AMYLASE in the last 168 hours. No results for input(s): AMMONIA in the last 168 hours. CBC:  Recent Labs Lab 01/31/15 1859 02/01/15 0710 02/02/15 0655  WBC 18.0* 18.6* 17.0*  NEUTROABS 11.7*  --  9.9*  HGB 6.2* 6.8* 6.4*  HCT 18.5* 19.7*  19.1*  MCV 87.7 87.9 87.2  PLT 521* 497* 496*   Cardiac Enzymes: No results for input(s): CKTOTAL, CKMB, CKMBINDEX, TROPONINI in the last 168 hours. BNP: Invalid input(s): POCBNP CBG: No results for input(s): GLUCAP in the last 168 hours.  No results found for this or any previous visit (from the past 240 hour(s)).   Studies:              All Imaging reviewed and is as per above notation   Scheduled Meds: . sodium chloride  10 mL/hr Intravenous Once  . aspirin  81 mg Oral Daily  . cholecalciferol  2,000 Units Oral Daily  . enoxaparin  120 mg Subcutaneous Q24H  . folic acid  1 mg Oral q morning - 10a  . HYDROmorphone   Intravenous 6 times per day  . hydroxyurea  500 mg Oral Daily  . ketorolac  30 mg Intravenous 4 times per day  . lisinopril  10 mg Oral Daily  . metoprolol succinate  50 mg Oral Daily  . morphine  30 mg Oral Q12H  . pantoprazole  40 mg Oral Q1200  . pneumococcal 23 valent vaccine  0.5 mL Intramuscular Tomorrow-1000  . potassium chloride SA  20  mEq Oral q morning - 10a  . senna-docusate  1 tablet Oral BID   Continuous Infusions: . dextrose 5 % and 0.45% NaCl 75 mL (02/03/15 [id number])    Domenic Polite, MD  [facility] Pager [phone] 02/03/2015, 12:07 PM    LOS: 3 days

## 2015-02-04 LAB — TYPE AND SCREEN
ABO/RH(D): O POS
ANTIBODY SCREEN: NEGATIVE
Unit division: 0
Unit division: 0

## 2015-02-04 LAB — CBC
HCT: 18.9 % — ABNORMAL LOW (ref 39.0–52.0)
Hemoglobin: 6.6 g/dL — CL (ref 13.0–17.0)
MCH: 30.3 pg (ref 26.0–34.0)
MCHC: 34.9 g/dL (ref 30.0–36.0)
MCV: 86.7 fL (ref 78.0–100.0)
PLATELETS: 481 10*3/uL — AB (ref 150–400)
RBC: 2.18 MIL/uL — ABNORMAL LOW (ref 4.22–5.81)
RDW: 19.1 % — AB (ref 11.5–15.5)
WBC: 17.3 10*3/uL — ABNORMAL HIGH (ref 4.0–10.5)

## 2015-02-04 LAB — BASIC METABOLIC PANEL
Anion gap: 8 (ref 5–15)
BUN: 13 mg/dL (ref 6–20)
CALCIUM: 8.7 mg/dL — AB (ref 8.9–10.3)
CO2: 23 mmol/L (ref 22–32)
CREATININE: 0.71 mg/dL (ref 0.61–1.24)
Chloride: 105 mmol/L (ref 101–111)
GFR calc non Af Amer: >60 mL/min (ref >60)
Glucose, Bld: 125 mg/dL — ABNORMAL HIGH (ref 65–99)
Potassium: 4.8 mmol/L (ref 3.5–5.1)
SODIUM: 136 mmol/L (ref 135–145)

## 2015-02-04 MED ORDER — HYDROMORPHONE HCL 1 MG/ML IJ SOLN
1.0000 mg | INTRAMUSCULAR | Status: DC | PRN
  Administered 2015-02-04 – 2015-02-05 (×6): 1 mg via INTRAVENOUS
  Filled 2015-02-04 (×6): qty 1

## 2015-02-04 MED ORDER — HYDROMORPHONE HCL 1 MG/ML IJ SOLN
1.0000 mg | INTRAMUSCULAR | Status: AC | PRN
  Administered 2015-02-04 (×2): 1 mg via INTRAVENOUS
  Filled 2015-02-04 (×2): qty 1

## 2015-02-04 NOTE — Progress Notes (Signed)
[name] [id number] DOB: 1980-06-01 DOA: 01/31/2015 PCP: [name], MD  Brief narrative: 35 y/o M with Known HbSS, Acute embolism r IJ + H/o PE on Xarelto H/o Pulm Htn ? 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 Acute Sickle cell pain crisis      - rates pain at 7-8/10 today, on Full dose PCA, IVF D51/2 NS at 75cc/hr      - Toradol, continue home dose MS contin dose, folic acid        - continue with Kpad.  Ambulate early, would like to have Dilaudid PRN added   2. Sickle cell anemia-received 1 U PRBC overnight on 12/31      - baseline Hg 6-7gm/dl , monitor Hg      - continue Folic Acid       - on full dose lovenox, no overt blood loss, monitor  3. Chronic resp failure/Pulm Htn and diastolic HF      - no evidence of volume overload, IVF at 75c/hr      - on 3-4L Home O2 at baseline      - continue Metoprolol, Lisinopril  4. Prior PE and R-IJ thrombosis - currently on Full dose lovenox,    5. Prior AVN R hip - mobilise early, pt independant at home  DVT prophylaxis: Lovenox tx dosing  Code Status:  Full code Family Communication: no family at bedside, pt declined my offer to update family  Disposition Plan: home in 48 hours   Subjective   feels better, rates pain at 7-8/10, using full dose PCA   Objective    Objective: Filed Vitals:   02/04/15 0536 02/04/15 0754 02/04/15 0844 02/04/15 1042  BP: 113/70   115/74  Pulse: 81   80  Temp: 98 F (36.7 C)     TempSrc: Oral     Resp: 18 18 18    Height:      Weight:      SpO2: 92% 91% 91%     Intake/Output Summary (Last 24 hours) at 02/04/15 1139 Last data filed at 02/04/15 0600  Gross per 24 hour  Intake 2588.75 ml  Output   1100 ml  Net 1488.75 ml    Exam:  General:AAOx3, flat affect, no distress Cardiovascular: s1 s  2/RRR Respiratory: no rales nor rhonchi Abdomen: soft nt nd no reboudn Skin intact LE's no edema Neuro neruo intact, moves all 4 limbs  Data Reviewed: Basic Metabolic Panel:  Recent Labs Lab 01/31/15 1859 02/02/15 0655 02/04/15 0440  NA 139 138 136  K 4.0 4.4 4.8  CL 107 109 105  CO2 23 23 23   GLUCOSE 119* 106* 125*  BUN 20 16 13   CREATININE 1.06 0.73 0.71  CALCIUM 8.8* 8.5* 8.7*   Liver Function Tests:  Recent Labs Lab 01/31/15 1859 02/02/15 0655  AST 46* 38  ALT 33 31  ALKPHOS 86 89  BILITOT 3.9* 2.7*  PROT 7.2 6.9  ALBUMIN 4.1 3.9   CBC:  Recent Labs Lab 01/31/15 1859 02/01/15 0710 02/02/15 0655 02/04/15 0440  WBC 18.0* 18.6* 17.0* 17.3*  NEUTROABS 11.7*  --  9.9*  --   HGB 6.2* 6.8* 6.4* 6.6*  HCT 18.5* 19.7* 19.1* 18.9*  MCV 87.7 87.9 87.2 86.7  PLT 521* 497* 496* 481*    Recent Results (from the past  240 hour(s))  MRSA PCR Screening     Status: Abnormal   Collection Time: 02/03/15  2:12 PM  Result Value Ref Range Status   MRSA by PCR POSITIVE (A) NEGATIVE Final     Studies:              All Imaging reviewed and is as per above notation   Scheduled Meds: . aspirin  81 mg Oral Daily  . cholecalciferol  2,000 Units Oral Daily  . enoxaparin  120 mg Subcutaneous Q24H  . folic acid  1 mg Oral q morning - 10a  . HYDROmorphone   Intravenous 6 times per day  . hydroxyurea  500 mg Oral Daily  . ketorolac  30 mg Intravenous 4 times per day  . lisinopril  10 mg Oral Daily  . metoprolol succinate  50 mg Oral Daily  . morphine  30 mg Oral Q12H  . pantoprazole  40 mg Oral Q1200  . potassium chloride SA  20 mEq Oral q morning - 10a  . senna-docusate  1 tablet Oral BID   Continuous Infusions: . dextrose 5 % and 0.45% NaCl 75 mL/hr at 02/04/15 1058   MAGICK-Amarisa Wilinski, Rebecca Eaton, MD  [facility] Pager [phone]  If 7PM-7AM, please contact night-coverage [email or web address] Password [email or web address]  02/04/2015, 11:39 AM    LOS: 4 days

## 2015-02-05 LAB — BASIC METABOLIC PANEL
Anion gap: 7 (ref 5–15)
BUN: 12 mg/dL (ref 6–20)
CALCIUM: 8.9 mg/dL (ref 8.9–10.3)
CHLORIDE: 108 mmol/L (ref 101–111)
CO2: 23 mmol/L (ref 22–32)
CREATININE: 0.74 mg/dL (ref 0.61–1.24)
Glucose, Bld: 135 mg/dL — ABNORMAL HIGH (ref 65–99)
Potassium: 4.7 mmol/L (ref 3.5–5.1)
SODIUM: 138 mmol/L (ref 135–145)

## 2015-02-05 LAB — CBC
HCT: 18.9 % — ABNORMAL LOW (ref 39.0–52.0)
HEMOGLOBIN: 6.4 g/dL — AB (ref 13.0–17.0)
MCH: 29.2 pg (ref 26.0–34.0)
MCHC: 33.9 g/dL (ref 30.0–36.0)
MCV: 86.3 fL (ref 78.0–100.0)
PLATELETS: 452 10*3/uL — AB (ref 150–400)
RBC: 2.19 MIL/uL — ABNORMAL LOW (ref 4.22–5.81)
RDW: 19.6 % — AB (ref 11.5–15.5)
WBC: 17.2 10*3/uL — ABNORMAL HIGH (ref 4.0–10.5)

## 2015-02-05 MED ORDER — HYDROMORPHONE HCL 1 MG/ML IJ SOLN
1.0000 mg | Freq: Once | INTRAMUSCULAR | Status: AC
  Administered 2015-02-05: 1 mg via INTRAVENOUS
  Filled 2015-02-05: qty 1

## 2015-02-05 MED ORDER — HYDROMORPHONE HCL 1 MG/ML IJ SOLN
1.0000 mg | INTRAMUSCULAR | Status: AC | PRN
  Administered 2015-02-05 – 2015-02-06 (×4): 1 mg via INTRAVENOUS
  Filled 2015-02-05 (×4): qty 1

## 2015-02-05 NOTE — Progress Notes (Signed)
[name] [id number] DOB: Jun 26, 1980 DOA: 01/31/2015 PCP: [name], MD  Brief narrative: 35 y/o M with Known HbSS, Acute embolism r IJ + H/o PE on Xarelto H/o Pulm Htn ? 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 Acute Sickle cell pain crisis      - less pain this AM, continue with IVF D51/2 NS at 50cc/hr      - Toradol, continue home dose MS contin dose, folic acid        - continue with Kpad.  Ambulate early, do not give as needed dilaudid   2. Sickle cell anemia-received 1 U PRBC overnight on 12/31      - baseline Hg 6-7gm/dl , monitor Hg      - continue Folic Acid       - on full dose lovenox, no overt blood loss, monitor  3. Chronic resp failure/Pulm Htn and diastolic HF      - no evidence of volume overload, IVF at 50c/hr      - on 3-4L Home O2 at baseline      - continue Metoprolol, Lisinopril  4. Prior PE and R-IJ thrombosis - currently on Full dose lovenox,    5. Prior AVN R hip - mobilise early, pt independant at home  DVT prophylaxis: Lovenox tx dosing  Code Status:  Full code Family Communication: no family at bedside, pt declined my offer to update family  Disposition Plan: home in 48 hours   Subjective   feels better, rates pain at 3/10, using full dose PCA   Objective    Objective: Filed Vitals:   02/05/15 0524 02/05/15 0619 02/05/15 0745 02/05/15 0940  BP:  122/79  111/69  Pulse:  75  68  Temp:  98.3 F (36.8 C)  97.7 F (36.5 C)  TempSrc:  Oral  Axillary  Resp: 15 16 14 15   Height:      Weight:  79.243 kg (174 lb 11.2 oz)    SpO2: 92% 95% 93% 92%    Intake/Output Summary (Last 24 hours) at 02/05/15 1000 Last data filed at 02/05/15 0620  Gross per 24 hour  Intake   1755 ml  Output   2000 ml  Net   -245 ml    Exam:  General:AAOx3, flat affect, no  distress Cardiovascular: s1 s 2/RRR Respiratory: no rales nor rhonchi Abdomen: soft nt nd no reboudn Skin intact LE's no edema Neuro neruo intact, moves all 4 limbs  Data Reviewed: Basic Metabolic Panel:  Recent Labs Lab 01/31/15 1859 02/02/15 0655 02/04/15 0440 02/05/15 0420  NA 139 138 136 138  K 4.0 4.4 4.8 4.7  CL 107 109 105 108  CO2 23 23 23 23   GLUCOSE 119* 106* 125* 135*  BUN 20 16 13 12   CREATININE 1.06 0.73 0.71 0.74  CALCIUM 8.8* 8.5* 8.7* 8.9   Liver Function Tests:  Recent Labs Lab 01/31/15 1859 02/02/15 0655  AST 46* 38  ALT 33 31  ALKPHOS 86 89  BILITOT 3.9* 2.7*  PROT 7.2 6.9  ALBUMIN 4.1 3.9   CBC:  Recent Labs Lab 01/31/15 1859 02/01/15 0710 02/02/15 0655 02/04/15 0440 02/05/15 0420  WBC 18.0* 18.6* 17.0* 17.3* 17.2*  NEUTROABS 11.7*  --  9.9*  --   --   HGB 6.2* 6.8* 6.4* 6.6* 6.4*  HCT 18.5* 19.7* 19.1* 18.9* 18.9*  MCV 87.7 87.9 87.2 86.7 86.3  PLT 521* 497* 496* 481* 452*    Recent Results (from the past 240 hour(s))  MRSA PCR Screening     Status: Abnormal   Collection Time: 02/03/15  2:12 PM  Result Value Ref Range Status   MRSA by PCR POSITIVE (A) NEGATIVE Final     Studies:              All Imaging reviewed and is as per above notation   Scheduled Meds: . aspirin  81 mg Oral Daily  . cholecalciferol  2,000 Units Oral Daily  . enoxaparin  120 mg Subcutaneous Q24H  . folic acid  1 mg Oral q morning - 10a  . HYDROmorphone   Intravenous 6 times per day  . hydroxyurea  500 mg Oral Daily  . ketorolac  30 mg Intravenous 4 times per day  . lisinopril  10 mg Oral Daily  . metoprolol succinate  50 mg Oral Daily  . morphine  30 mg Oral Q12H  . pantoprazole  40 mg Oral Q1200  . potassium chloride SA  20 mEq Oral q morning - 10a  . senna-docusate  1 tablet Oral BID   Continuous Infusions: . dextrose 5 % and 0.45% NaCl 75 mL/hr at 02/04/15 2344   Faye Ramsay, MD  [facility] Pager [phone]  If 7PM-7AM,  please contact night-coverage [email or web address] Password [email or web address]  02/05/2015, 10:00 AM    LOS: 5 days

## 2015-02-05 NOTE — Progress Notes (Signed)
Patient asked for something else for pain at this time. NP Baltazar Najjar has been paged regarding request. Awaiting any further orders

## 2015-02-05 NOTE — Care Management Note (Addendum)
Case Management Note  Patient Details  Name: [name] MRN: [id number] Date of Birth: 1980-06-20  Subjective/Objective:       35 yo admitted with [facility]             Action/Plan: From home alone. Pt on chronic home 02.  Expected Discharge Date:  02/03/15               Expected Discharge Plan:  Home/Self Care  In-House Referral:     Discharge planning Services  CM Consult  Post Acute Care Choice:    Choice offered to:     DME Arranged:    DME Agency:     HH Arranged:    HH Agency:     Status of Service:  In process, will continue to follow  Medicare Important Message Given:  Yes Date Medicare IM Given:    Medicare IM give by:    Date Additional Medicare IM Given:    Additional Medicare Important Message give by:     If discussed at [place] of Stay Meetings, dates discussed:    Additional Comments: Chart reviewed and CM following for DC needs. Lynnell Catalan, RN 02/05/2015, 12:54 PM

## 2015-02-06 DIAGNOSIS — I482 Chronic atrial fibrillation: Secondary | ICD-10-CM

## 2015-02-06 LAB — BASIC METABOLIC PANEL
ANION GAP: 8 (ref 5–15)
BUN: 8 mg/dL (ref 6–20)
CHLORIDE: 107 mmol/L (ref 101–111)
CO2: 24 mmol/L (ref 22–32)
Calcium: 9 mg/dL (ref 8.9–10.3)
Creatinine, Ser: 0.66 mg/dL (ref 0.61–1.24)
GFR calc non Af Amer: >60 mL/min (ref >60)
Glucose, Bld: 103 mg/dL — ABNORMAL HIGH (ref 65–99)
Potassium: 4.4 mmol/L (ref 3.5–5.1)
SODIUM: 139 mmol/L (ref 135–145)

## 2015-02-06 LAB — CBC
HEMATOCRIT: 19.3 % — AB (ref 39.0–52.0)
HEMOGLOBIN: 6.6 g/dL — AB (ref 13.0–17.0)
MCH: 30 pg (ref 26.0–34.0)
MCHC: 34.2 g/dL (ref 30.0–36.0)
MCV: 87.7 fL (ref 78.0–100.0)
Platelets: 442 10*3/uL — ABNORMAL HIGH (ref 150–400)
RBC: 2.2 MIL/uL — AB (ref 4.22–5.81)
RDW: 20.2 % — ABNORMAL HIGH (ref 11.5–15.5)
WBC: 16.1 10*3/uL — AB (ref 4.0–10.5)

## 2015-02-06 MED ORDER — HYDROMORPHONE HCL 1 MG/ML IJ SOLN
1.0000 mg | Freq: Three times a day (TID) | INTRAMUSCULAR | Status: DC | PRN
  Administered 2015-02-06 – 2015-02-07 (×3): 1 mg via INTRAVENOUS
  Filled 2015-02-06: qty 1

## 2015-02-06 NOTE — Progress Notes (Signed)
[name] [id number] DOB: 1980-10-23 DOA: 01/31/2015 PCP: [name], MD  Brief narrative: 35 y/o M with Known HbSS, Acute embolism r IJ + H/o PE on Xarelto H/o Pulm Htn ? 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 Acute Sickle cell pain crisis      - less pain this AM, continue with IVF D51/2 NS at 50cc/hr      - Toradol, continue home dose MS contin dose, folic acid        - continue with Kpad.  Ambulate early, tried giving as needed boluses of Dilaudid but pt too drowsy this AM, so will try to avoid as possible   2. Sickle cell anemia-received 1 U PRBC overnight on 12/31      - baseline Hg 6-7gm/dl , monitor Hg      - continue Folic Acid       - on full dose lovenox, no overt blood loss, monitor  3. Chronic resp failure/Pulm Htn and diastolic HF      - no evidence of volume overload, IVF at 50c/hr      - on 3-4L Home O2 at baseline      - continue Metoprolol, Lisinopril  4. Prior PE and R-IJ thrombosis - currently on Full dose lovenox, no signs of bleeding   5. Prior AVN R hip - mobilise early, pt independant at home  DVT prophylaxis: Lovenox tx dosing  Code Status:  Full code Family Communication: no family at bedside, pt declined my offer to update family  Disposition Plan: home when off of PCA   Subjective   feels better, rates pain at 5/10, using full dose PCA   Objective    Objective: Filed Vitals:   02/06/15 0217 02/06/15 0258 02/06/15 0724 02/06/15 0850  BP: 136/85  112/79   Pulse: 77  77   Temp: 98.9 F (37.2 C)  98.1 F (36.7 C)   TempSrc: Oral  Oral   Resp: 24 16 20 16   Height:      Weight:   77.157 kg (170 lb 1.6 oz)   SpO2: 93% 92% 90% 96%    Intake/Output Summary (Last 24 hours) at 02/06/15 1011 Last data filed at 02/06/15 0725  Gross per 24 hour  Intake   1000 ml    Output   2650 ml  Net  -1650 ml    Exam: Cardiovascular: s1 s 2/RRR Respiratory: no rales nor rhonchi Abdomen: soft nt nd no reboudn Skin intact LE's no edema Neuro: drowsy, opens eyes but not answering questions, says he is to tired   Data Reviewed: Basic Metabolic Panel:  Recent Labs Lab 01/31/15 1859 02/02/15 0655 02/04/15 0440 02/05/15 0420 02/06/15 0630  NA 139 138 136 138 139  K 4.0 4.4 4.8 4.7 4.4  CL 107 109 105 108 107  CO2 23 23 23 23 24   GLUCOSE 119* 106* 125* 135* 103*  BUN 20 16 13 12 8   CREATININE 1.06 0.73 0.71 0.74 0.66  CALCIUM 8.8* 8.5* 8.7* 8.9 9.0   Liver Function Tests:  Recent Labs Lab 01/31/15 1859 02/02/15 0655  AST 46* 38  ALT 33 31  ALKPHOS 86 89  BILITOT 3.9* 2.7*  PROT 7.2 6.9  ALBUMIN 4.1 3.9   CBC:  Recent Labs Lab 01/31/15 1859 02/01/15 0710 02/02/15 0655 02/04/15 0440 02/05/15  0420 02/06/15 0630  WBC 18.0* 18.6* 17.0* 17.3* 17.2* 16.1*  NEUTROABS 11.7*  --  9.9*  --   --   --   HGB 6.2* 6.8* 6.4* 6.6* 6.4* 6.6*  HCT 18.5* 19.7* 19.1* 18.9* 18.9* 19.3*  MCV 87.7 87.9 87.2 86.7 86.3 87.7  PLT 521* 497* 496* 481* 452* 442*    Recent Results (from the past 240 hour(s))  MRSA PCR Screening     Status: Abnormal   Collection Time: 02/03/15  2:12 PM  Result Value Ref Range Status   MRSA by PCR POSITIVE (A) NEGATIVE Final     Studies:              All Imaging reviewed and is as per above notation   Scheduled Meds: . aspirin  81 mg Oral Daily  . cholecalciferol  2,000 Units Oral Daily  . enoxaparin  120 mg Subcutaneous Q24H  . folic acid  1 mg Oral q morning - 10a  . HYDROmorphone   Intravenous 6 times per day  . hydroxyurea  500 mg Oral Daily  . ketorolac  30 mg Intravenous 4 times per day  . lisinopril  10 mg Oral Daily  . metoprolol succinate  50 mg Oral Daily  . morphine  30 mg Oral Q12H  . pantoprazole  40 mg Oral Q1200  . potassium chloride SA  20 mEq Oral q morning - 10a  . senna-docusate  1 tablet Oral  BID   Continuous Infusions: . dextrose 5 % and 0.45% NaCl 50 mL/hr at 02/05/15 1749   Faye Ramsay, MD  [facility] Pager [phone]  If 7PM-7AM, please contact night-coverage [email or web address] Password [email or web address]  02/06/2015, 10:11 AM    LOS: 6 days

## 2015-02-07 LAB — BASIC METABOLIC PANEL
ANION GAP: 8 (ref 5–15)
BUN: 9 mg/dL (ref 6–20)
CALCIUM: 8.8 mg/dL — AB (ref 8.9–10.3)
CHLORIDE: 105 mmol/L (ref 101–111)
CO2: 25 mmol/L (ref 22–32)
CREATININE: 0.62 mg/dL (ref 0.61–1.24)
GFR calc non Af Amer: >60 mL/min (ref >60)
GLUCOSE: 105 mg/dL — AB (ref 65–99)
Potassium: 4.4 mmol/L (ref 3.5–5.1)
Sodium: 138 mmol/L (ref 135–145)

## 2015-02-07 LAB — CBC
HEMATOCRIT: 19.9 % — AB (ref 39.0–52.0)
HEMOGLOBIN: 6.6 g/dL — AB (ref 13.0–17.0)
MCH: 29.1 pg (ref 26.0–34.0)
MCHC: 33.2 g/dL (ref 30.0–36.0)
MCV: 87.7 fL (ref 78.0–100.0)
Platelets: 481 10*3/uL — ABNORMAL HIGH (ref 150–400)
RBC: 2.27 MIL/uL — ABNORMAL LOW (ref 4.22–5.81)
RDW: 20.7 % — AB (ref 11.5–15.5)
WBC: 16.1 10*3/uL — ABNORMAL HIGH (ref 4.0–10.5)

## 2015-02-07 MED ORDER — HYDROMORPHONE HCL 2 MG/ML IJ SOLN
2.0000 mg | INTRAMUSCULAR | Status: DC | PRN
  Administered 2015-02-07 – 2015-02-08 (×8): 2 mg via INTRAVENOUS
  Filled 2015-02-07 (×4): qty 1

## 2015-02-07 NOTE — Progress Notes (Signed)
JATAVIUS MANCHEGO [id number] DOB: 31-Mar-1980 DOA: 01/31/2015 PCP: [name], MD  Brief narrative:  35 y/o ? 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   Patient is extremely tolerant to opiates. He has been on Dilaudid PCA was bolus dose of 0.7 mg. He also has physician assisted dosing 1 mg every 8 hours. Patient is worried that he is not getting adequate pain control. His previous pain regimen has been changed and since then he's been having more issues. He is on Dilaudid PCA and has used 16 mg with 108 demands and 68 deliveries in the last 24 hours. He also has pulmonary hypertension requiring oxygen at 2 L/m at home. He is on telemetry but has no cardiac issues at this point. No cough no fever no nausea vomiting or diarrhea.   Past medical history-As per Problem list Chart reviewed as below-   Consultants:    Procedures:    Antibiotics:     Subjective   No fever no chills no dark/tarry stool Pain rated 8/10, No cough no cold    Objective    Interim History:   Telemetry: nsr   Objective: Filed Vitals:   02/07/15 1300 02/07/15 1600 02/07/15 1823 02/07/15 1953  BP: 114/65  123/65   Pulse: 72  75   Temp: 98.4 F (36.9 C)  98.9 F (37.2 C)   TempSrc: Oral  Oral   Resp: 16 17 16 14   Height:      Weight:      SpO2: 98% 98% 98% 97%    Intake/Output Summary (Last 24 hours) at 02/07/15 2105 Last data filed at 02/07/15 2007  Gross per 24 hour  Intake    960 ml  Output   3450 ml  Net  -2490 ml    Exam:  General: eomi ncat in nad.  No  ict Cardiovascular: s1 s 2 slightly tachy cardic Respiratory: no rales nor rhonchi Abdomen: soft nt nd no reboudn Skin intact LE's no edema Neuro neruo intact, moves all 4 limbs  Data Reviewed: Basic Metabolic Panel:  Recent Labs Lab 02/02/15 0655 02/04/15 0440 02/05/15 0420 02/06/15 0630 02/07/15 0500  NA 138 136 138 139 138  K 4.4 4.8 4.7 4.4 4.4  CL 109 105 108 107 105  CO2 23 23 23 24 25   GLUCOSE 106* 125* 135* 103* 105*  BUN 16 13 12 8 9   CREATININE 0.73 0.71 0.74 0.66 0.62  CALCIUM 8.5* 8.7* 8.9 9.0 8.8*   Liver Function Tests:  Recent Labs Lab 02/02/15 0655  AST 38  ALT 31  ALKPHOS 89  BILITOT 2.7*  PROT 6.9  ALBUMIN 3.9   No results for input(s): LIPASE, AMYLASE in the last 168 hours. No results for input(s): AMMONIA in the last 168 hours. CBC:  Recent Labs Lab 02/02/15 0655 02/04/15 0440 02/05/15 0420 02/06/15 0630 02/07/15 0500  WBC 17.0* 17.3* 17.2* 16.1* 16.1*  NEUTROABS 9.9*  --   --   --   --   HGB 6.4* 6.6* 6.4* 6.6*  6.6*  HCT 19.1* 18.9* 18.9* 19.3* 19.9*  MCV 87.2 86.7 86.3 87.7 87.7  PLT 496* 481* 452* 442* 481*   Cardiac Enzymes: No results for input(s): CKTOTAL, CKMB, CKMBINDEX, TROPONINI in the last 168 hours. BNP: Invalid input(s): POCBNP CBG: No results for input(s): GLUCAP in the last 168 hours.  Recent Results (from the past 240 hour(s))  MRSA PCR Screening     Status: Abnormal   Collection Time: 02/03/15  2:12 PM  Result Value Ref Range Status   MRSA by PCR POSITIVE (A) NEGATIVE Final    Comment:        The GeneXpert MRSA Assay (FDA approved for NASAL specimens only), is one component of a comprehensive MRSA colonization surveillance program. It is not intended to diagnose MRSA infection nor to guide or monitor treatment for MRSA infections. RESULT CALLED TO, READ BACK BY AND VERIFIED WITH: MELTON,A @ [id number] ON [id number] BY POTEAT,S      Studies:              All Imaging reviewed and is as per above notation    Scheduled Meds: . sodium chloride  10 mL/hr Intravenous Once  . aspirin  81 mg Oral Daily  . Chlorhexidine Gluconate Cloth  6 each Topical Q0600  . cholecalciferol  2,000 Units Oral Daily  . enoxaparin  120 mg Subcutaneous Q24H  . folic acid  1 mg Oral q morning - 10a  . HYDROmorphone   Intravenous 6 times per day  . hydroxyurea  500 mg Oral Daily  . lisinopril  10 mg Oral Daily  . metoprolol succinate  50 mg Oral Daily  . morphine  30 mg Oral Q12H  . mupirocin ointment  1 application Nasal BID  . pantoprazole  40 mg Oral Q1200  . pneumococcal 23 valent vaccine  0.5 mL Intramuscular Tomorrow-1000  . potassium chloride SA  20 mEq Oral q morning - 10a  . senna-docusate  1 tablet Oral BID   Continuous Infusions: . dextrose 5 % and 0.45% NaCl 50 mL/hr at 02/06/15 1525     Assessment/Plan:  1. Acute painful crisis: Patient is not getting full control. I will change physician assisted dosing to 2  mg every 4 hours for the next 24 hours. Continue PCA and long-acting MS Contin.  2. SCC disease + anemia-received 1 U PRBC this admission. Patient has responded and hemoglobin seems to be stable at this point. Continue Folic Acid 1 daily. Continue hydroxyurea. 3. Pulm HtN and diastolic HF-patient is stable and at baseline. Continue Metoprolol 50 qd, Lisinopril 10 qd.  On chr Oxygen 2-3 liters at home for past 3-4 months and will need to continue this as OP 4. Prior PE and R-IJ thrombosis-currently on lovenox Therapeutic doses.  No dark stools. Given high risk of sickling and thrombosis would continue lifelong.  Also on ASA 81 which increases risk of bleeding.  Patient to notify if bloody stools. Patient has been doing better however 5. Prior AVN R hip-mobilise early-independant at home    Appt with PCP: not yet Code Status:  Full code Family Communication: nofamily + Disposition Plan: likely home when able DVT prophylaxis: Lovenox tx dosing  Barbette Merino, MD Pager [phone] 02/07/2015, 9:05 PM    LOS: 7 days

## 2015-02-08 IMAGING — CR DG CHEST 2V
2 series · 2 of 2 positions shown · non-contrast
Comparison: 03/01/2012

CLINICAL DATA: 31-year-old male with chest pain - history of sickle
cell disease.

CHEST - 2 VIEW

[w chest pa]
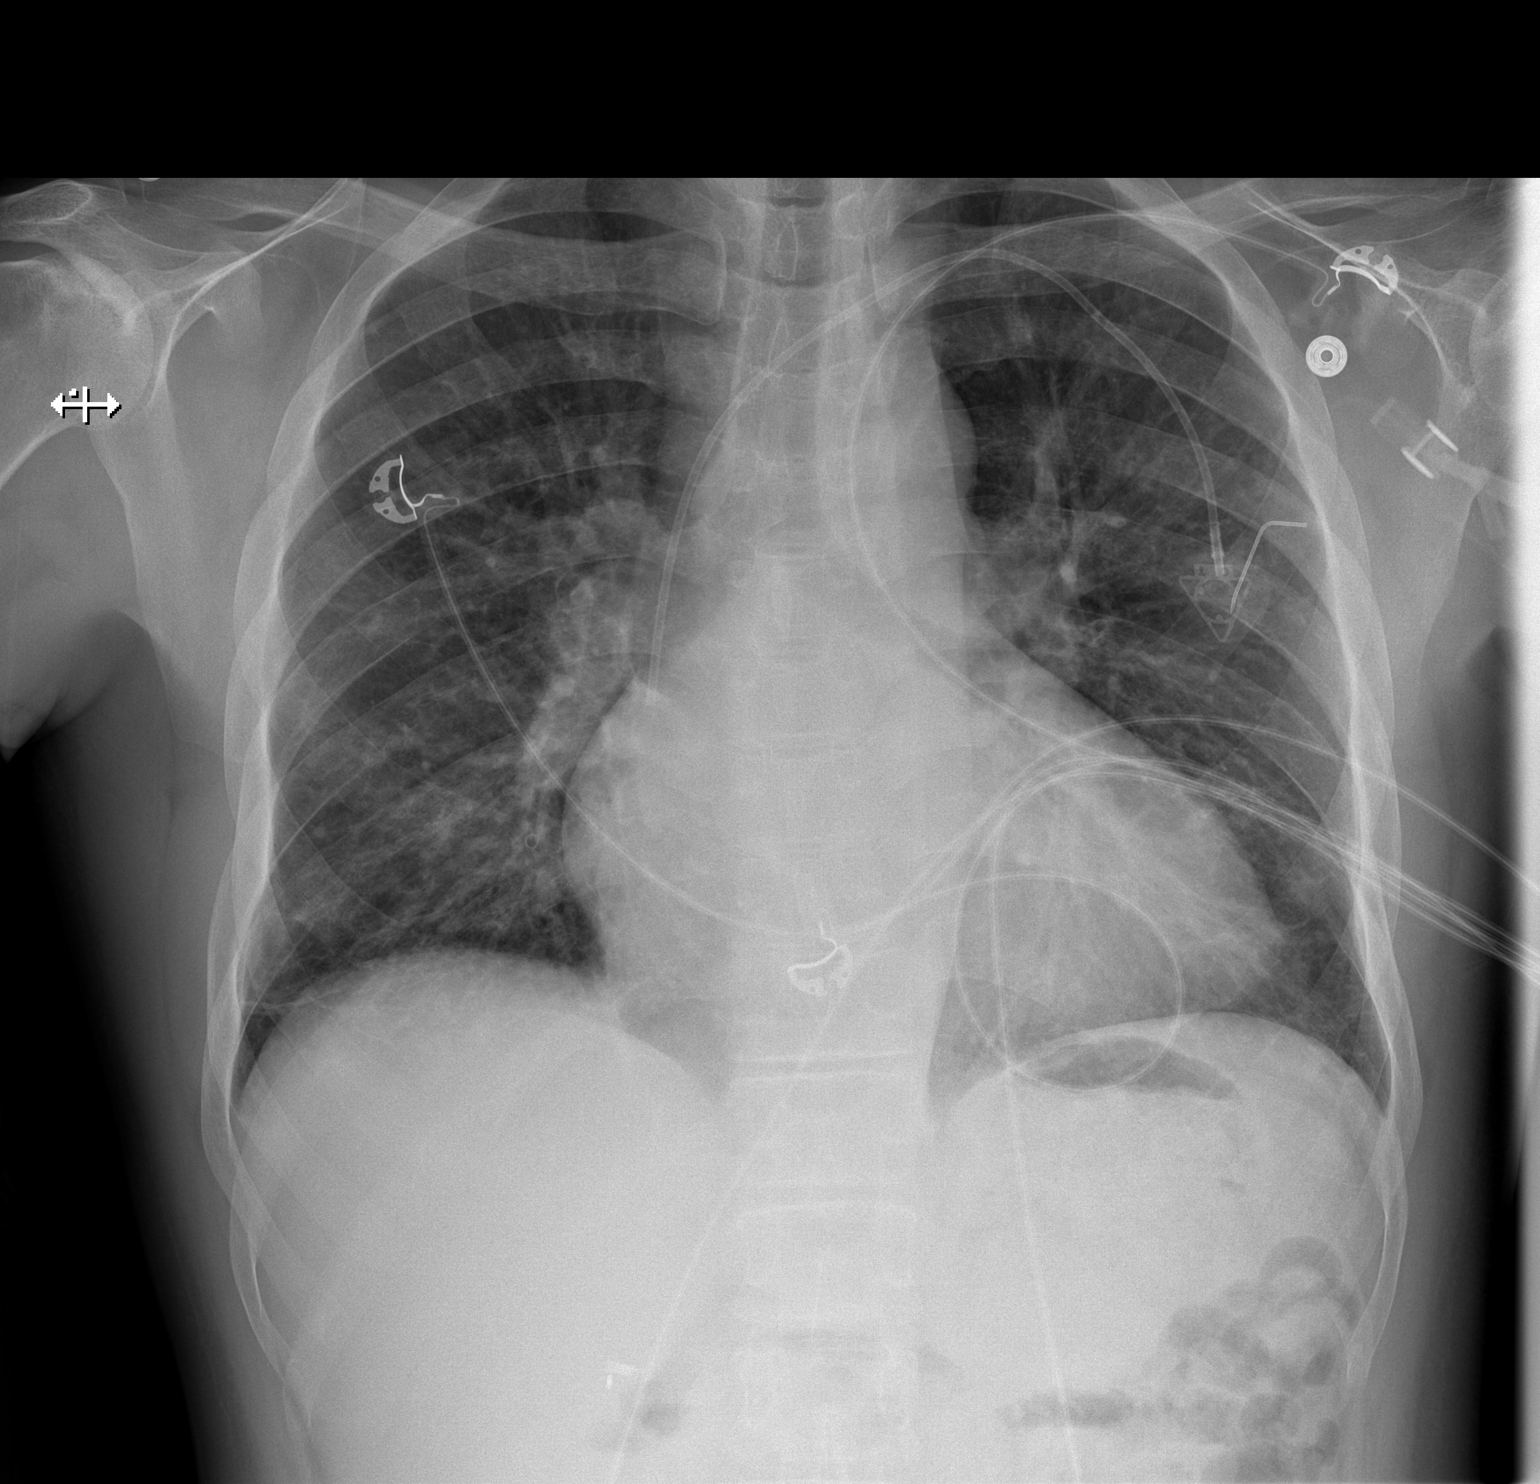

[w chest lat]
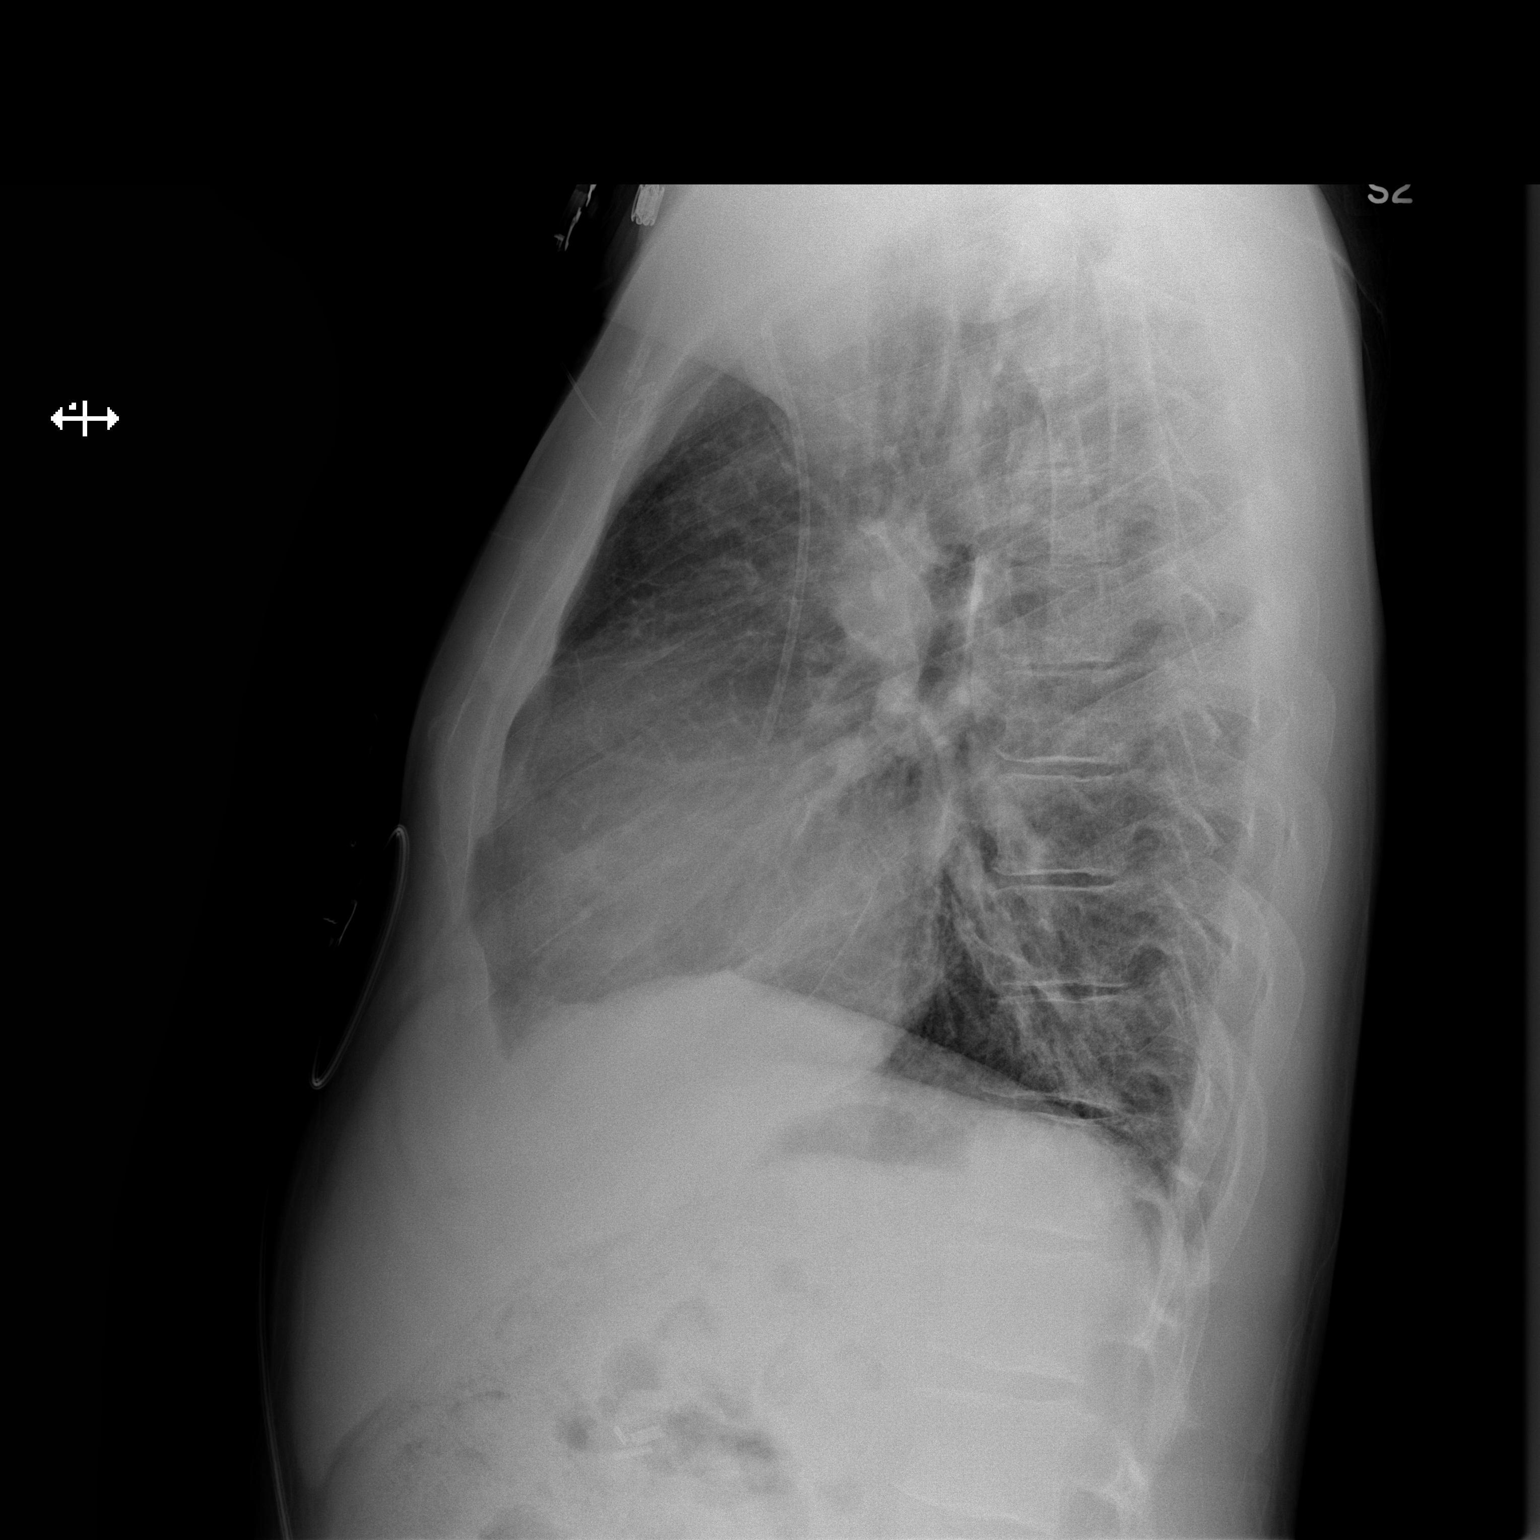

[2 of 2 positions shown; findings below may reference images not displayed]

FINDINGS: Cardiomegaly and left-sided Port-A-Cath again noted.
Mild interstitial prominence is again noted.
There is no evidence of focal airspace disease, pulmonary edema,
suspicious pulmonary nodule/mass, pleural effusion, or
pneumothorax.
No acute bony abnormalities are identified.
IMPRESSION: Cardiomegaly without evidence of acute cardiopulmonary disease

## 2015-02-08 MED ORDER — HYDROMORPHONE HCL 4 MG PO TABS
4.0000 mg | ORAL_TABLET | ORAL | Status: DC
  Administered 2015-02-08 – 2015-02-10 (×10): 4 mg via ORAL
  Filled 2015-02-08 (×10): qty 1

## 2015-02-08 NOTE — Progress Notes (Signed)
[name] [id number] DOB: Oct 30, 1980 DOA: 01/31/2015 PCP: [name], MD  Brief narrative:  35 y/o ? 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   Patient is extremely tolerant to opiates. He has been on Dilaudid PCA with bolus dose of 0.7 mg. He also has physician assisted dosing 1 mg every 8 hours. Patient is having mainly nightly attacks. He is doing better during the day. He is on Dilaudid PCA and has used 60 mg with 107 demands and 69 deliveries in the last 24 hours. He also has pulmonary hypertension requiring oxygen at 2 L/m at home. He is on telemetry but has no cardiac issues at this point. No cough no fever no nausea vomiting or diarrhea.   Past medical history-As per Problem list Chart reviewed as below-   Consultants:    Procedures:    Antibiotics:     Subjective   Pain at 6/10 today. No fever no chills no NVD.    Objective    Interim History:   Telemetry: Discontinued   Objective: Filed Vitals:   02/08/15 1037 02/08/15 1200 02/08/15 1328 02/08/15 1530  BP: 113/61  108/66   Pulse: 64  66   Temp: 98.4 F (36.9 C)  98.9 F (37.2 C)   TempSrc: Oral  Oral   Resp: 16 15 16 16   Height:      Weight:      SpO2: 98% 98% 99% 98%    Intake/Output Summary (Last 24 hours) at 02/08/15 1900 Last data filed at 02/08/15 1300  Gross per 24 hour  Intake  715.9 ml  Output   3100 ml  Net -2384.1 ml    Exam:  General: eomi ncat in nad.  No ict Cardiovascular: s1 s 2 slightly tachy cardic Respiratory: no rales nor  rhonchi Abdomen: soft nt nd no reboudn Skin intact LE's no edema Neuro neruo intact, moves all 4 limbs  Data Reviewed: Basic Metabolic Panel:  Recent Labs Lab 02/02/15 0655 02/04/15 0440 02/05/15 0420 02/06/15 0630 02/07/15 0500  NA 138 136 138 139 138  K 4.4 4.8 4.7 4.4 4.4  CL 109 105 108 107 105  CO2 23 23 23 24 25   GLUCOSE 106* 125* 135* 103* 105*  BUN 16 13 12 8 9   CREATININE 0.73 0.71 0.74 0.66 0.62  CALCIUM 8.5* 8.7* 8.9 9.0 8.8*   Liver Function Tests:  Recent Labs Lab 02/02/15 0655  AST 38  ALT 31  ALKPHOS 89  BILITOT 2.7*  PROT 6.9  ALBUMIN 3.9   No results for input(s): LIPASE, AMYLASE in the last 168 hours. No results for input(s): AMMONIA in the last 168 hours. CBC:  Recent Labs Lab 02/02/15 0655 02/04/15 0440 02/05/15 0420 02/06/15 0630 02/07/15 0500  WBC 17.0* 17.3* 17.2* 16.1* 16.1*  NEUTROABS 9.9*  --   --   --   --   HGB 6.4* 6.6* 6.4* 6.6* 6.6*  HCT 19.1* 18.9* 18.9* 19.3* 19.9*  MCV 87.2 86.7 86.3 87.7 87.7  PLT 496* 481* 452*  442* 481*   Cardiac Enzymes: No results for input(s): CKTOTAL, CKMB, CKMBINDEX, TROPONINI in the last 168 hours. BNP: Invalid input(s): POCBNP CBG: No results for input(s): GLUCAP in the last 168 hours.  Recent Results (from the past 240 hour(s))  MRSA PCR Screening     Status: Abnormal   Collection Time: 02/03/15  2:12 PM  Result Value Ref Range Status   MRSA by PCR POSITIVE (A) NEGATIVE Final    Comment:        The GeneXpert MRSA Assay (FDA approved for NASAL specimens only), is one component of a comprehensive MRSA colonization surveillance program. It is not intended to diagnose MRSA infection nor to guide or monitor treatment for MRSA infections. RESULT CALLED TO, READ BACK BY AND VERIFIED WITH: MELTON,A @ [id number] ON [id number] BY POTEAT,S      Studies:              All Imaging reviewed and is as per above notation   Scheduled Meds: . sodium chloride  10 mL/hr Intravenous Once  . aspirin  81  mg Oral Daily  . cholecalciferol  2,000 Units Oral Daily  . enoxaparin  120 mg Subcutaneous Q24H  . folic acid  1 mg Oral q morning - 10a  . HYDROmorphone   Intravenous 6 times per day  . hydroxyurea  500 mg Oral Daily  . lisinopril  10 mg Oral Daily  . metoprolol succinate  50 mg Oral Daily  . morphine  30 mg Oral Q12H  . pantoprazole  40 mg Oral Q1200  . pneumococcal 23 valent vaccine  0.5 mL Intramuscular Tomorrow-1000  . potassium chloride SA  20 mEq Oral q morning - 10a  . senna-docusate  1 tablet Oral BID   Continuous Infusions: . dextrose 5 % and 0.45% NaCl 50 mL/hr at 02/08/15 0133     Assessment/Plan:  1. Acute painful crisis: Patient is getting better control. I will continue physician assisted dosing to 2  mg every 4 hours and add bedtime oral medications tonight to see if that controls the pain then DC home on that regimen. Continue PCA and long-acting MS Contin.  2. SCC disease + anemia-received 1 U PRBC this admission. Patient has responded and hemoglobin seems to be stable at this point. Continue Folic Acid 1 daily. Continue hydroxyurea. 3. Pulm HtN and diastolic HF-patient is stable and at baseline. Continue Metoprolol 50 qd, Lisinopril 10 qd.  On chr Oxygen 2-3 liters at home for past 3-4 months and will need to continue this as OP 4. Prior PE and R-IJ thrombosis-currently on lovenox Therapeutic doses.  No dark stools. Given high risk of sickling and thrombosis would continue lifelong.  Also on ASA 81 which increases risk of bleeding.  Patient to notify if bloody stools. Patient has been doing better however 5. 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

## 2015-02-09 DIAGNOSIS — D72829 Elevated white blood cell count, unspecified: Secondary | ICD-10-CM

## 2015-02-09 DIAGNOSIS — D638 Anemia in other chronic diseases classified elsewhere: Secondary | ICD-10-CM

## 2015-02-09 DIAGNOSIS — Z7901 Long term (current) use of anticoagulants: Secondary | ICD-10-CM

## 2015-02-09 LAB — COMPREHENSIVE METABOLIC PANEL
ALBUMIN: 3.8 g/dL (ref 3.5–5.0)
ALT: 30 U/L (ref 17–63)
ANION GAP: 8 (ref 5–15)
AST: 37 U/L (ref 15–41)
Alkaline Phosphatase: 111 U/L (ref 38–126)
BILIRUBIN TOTAL: 3.2 mg/dL — AB (ref 0.3–1.2)
BUN: 11 mg/dL (ref 6–20)
CHLORIDE: 105 mmol/L (ref 101–111)
CO2: 25 mmol/L (ref 22–32)
Calcium: 8.7 mg/dL — ABNORMAL LOW (ref 8.9–10.3)
Creatinine, Ser: 0.67 mg/dL (ref 0.61–1.24)
GFR calc Af Amer: >60 mL/min (ref >60)
Glucose, Bld: 133 mg/dL — ABNORMAL HIGH (ref 65–99)
POTASSIUM: 4.3 mmol/L (ref 3.5–5.1)
Sodium: 138 mmol/L (ref 135–145)
TOTAL PROTEIN: 7.3 g/dL (ref 6.5–8.1)

## 2015-02-09 LAB — CBC WITH DIFFERENTIAL/PLATELET
BASOS ABS: 0.1 10*3/uL (ref 0.0–0.1)
BASOS PCT: 1 %
EOS PCT: 5 %
Eosinophils Absolute: 0.8 10*3/uL — ABNORMAL HIGH (ref 0.0–0.7)
HEMATOCRIT: 20.5 % — AB (ref 39.0–52.0)
Hemoglobin: 6.9 g/dL — CL (ref 13.0–17.0)
Lymphocytes Relative: 15 %
Lymphs Abs: 2.5 10*3/uL (ref 0.7–4.0)
MCH: 29.7 pg (ref 26.0–34.0)
MCHC: 33.7 g/dL (ref 30.0–36.0)
MCV: 88.4 fL (ref 78.0–100.0)
MONO ABS: 2.3 10*3/uL — AB (ref 0.1–1.0)
MONOS PCT: 14 %
Neutro Abs: 10.5 10*3/uL — ABNORMAL HIGH (ref 1.7–7.7)
Neutrophils Relative %: 65 %
PLATELETS: 501 10*3/uL — AB (ref 150–400)
RBC: 2.32 MIL/uL — ABNORMAL LOW (ref 4.22–5.81)
RDW: 20.3 % — AB (ref 11.5–15.5)
WBC: 16.2 10*3/uL — ABNORMAL HIGH (ref 4.0–10.5)

## 2015-02-09 MED ORDER — HYDROMORPHONE 1 MG/ML IV SOLN
INTRAVENOUS | Status: DC
  Administered 2015-02-09: 3 mg via INTRAVENOUS
  Administered 2015-02-10: 6.5 mg via INTRAVENOUS
  Administered 2015-02-10: 6 mg via INTRAVENOUS
  Administered 2015-02-10: 01:00:00 via INTRAVENOUS
  Administered 2015-02-10: 10 mg via INTRAVENOUS
  Administered 2015-02-10: 5.5 mg via INTRAVENOUS
  Administered 2015-02-10: 16:00:00 via INTRAVENOUS
  Administered 2015-02-10: 3.5 mg via INTRAVENOUS
  Administered 2015-02-10: 6.5 mg via INTRAVENOUS
  Administered 2015-02-11: 5.7 mg via INTRAVENOUS
  Administered 2015-02-11: 5.5 mg via INTRAVENOUS
  Administered 2015-02-11: 17:00:00 via INTRAVENOUS
  Administered 2015-02-11: 6 mg via INTRAVENOUS
  Administered 2015-02-11: 5 mg via INTRAVENOUS
  Administered 2015-02-11: 9.5 mg via INTRAVENOUS
  Administered 2015-02-11: 5.5 mg via INTRAVENOUS
  Administered 2015-02-12: 8 mg via INTRAVENOUS
  Administered 2015-02-12: 11 mg via INTRAVENOUS
  Administered 2015-02-12: 4.5 mg via INTRAVENOUS
  Filled 2015-02-09 (×5): qty 25

## 2015-02-09 NOTE — Progress Notes (Signed)
SICKLE CELL SERVICE PROGRESS NOTE  [name] [id number] DOB: [birth year] DOA: 01/31/2015 PCP: [name], MD  Assessment/Plan: Principal Problem:   Sickle cell crisis ([name]) Active Problems:   Chronic anticoagulation   Chronic respiratory failure with hypoxia (HCC)   Essential hypertension   Chronic pain syndrome   Embolism, pulmonary with infarction (HCC)   Chronic atrial fibrillation (HCC)   Anemia  1. Hb SS with crisis: Pt has used 48.1 mg with 68/63:demands/deliveries. Will decrease PCA bolus to 0.6 mg. Continue scheduled oral Dilaudid. Encourage incentive spirometer and ambulation. 2. Leukocytosis: Pt has a chronic leukocytosis of about 15-17K. Currently at baseline. 3. Anemia of chronic disease: Hb currently at baseline.  4. Chronic pain: Contine MS Contin   Code Status: Full Code Family Communication: N/A Disposition Plan: Not yet ready for discharge  [place].  Pager [phone]. If 7PM-7AM, please contact night-coverage.  02/09/2015, 1:47 PM  LOS: 9 days   Admitting History: Pt reports pain in ribs at 7-8/10 compared to baseline of 5-6/10. Pt states that he finds himself falling asleep after 2-3 PCA bolus pushes and then the pain has escalated upon awakening 40-50 minutes later. Last BM 48 hours ago. Consultants:  None  Procedures:  None  Antibiotics:  None   Objective: Filed Vitals:   02/09/15 0814 02/09/15 1059 02/09/15 1206 02/09/15 1311  BP:  107/82  105/59  Pulse:  81  70  Temp:  98.5 F (36.9 C)  98.8 F (37.1 C)  TempSrc:  Oral  Oral  Resp:  16 12 16   Height:      Weight:      SpO2: 93% 93% 99% 100%   Weight change:   Intake/Output Summary (Last 24 hours) at 02/09/15 1347 Last data filed at 02/09/15 1300  Gross per 24 hour  Intake 1296.2 ml  Output   1650 ml  Net -353.8 ml    General: Alert, awake, oriented x3, in mild distress secondary to pain. HEENT: [place]/AT PEERL, EOMI, mild icterus Neck: Trachea  midline,  no masses, no thyromegal,y no JVD, no carotid bruit OROPHARYNX:  Moist, No exudate/ erythema/lesions.  Heart: Regular rate and rhythm, without murmurs, rubs, gallops, PMI non-displaced, no heaves or thrills on palpation.  Lungs: Clear to auscultation, no wheezing or rhonchi noted. No increased vocal fremitus resonant to percussion  Abdomen: Soft, nontender, nondistended, positive bowel sounds, no masses no hepatosplenomegaly noted..  Neuro: No focal neurological deficits noted cranial nerves II through XII grossly intact. DTRs 2+ bilaterally upper and lower extremities. Strength at functional baseline in BUE's and BLE's.  Musculoskeletal: No warm swelling or erythema around joints, no spinal tenderness noted. Psychiatric: Patient alert and oriented x3, good insight and cognition, good recent to remote recall.    Data Reviewed: Basic Metabolic Panel:  Recent Labs Lab 02/04/15 0440 02/05/15 0420 02/06/15 0630 02/07/15 0500 02/09/15 0340  NA 136 138 139 138 138  K 4.8 4.7 4.4 4.4 4.3  CL 105 108 107 105 105  CO2 23 23 24 25 25   GLUCOSE 125* 135* 103* 105* 133*  BUN 13 12 8 9 11   CREATININE 0.71 0.74 0.66 0.62 0.67  CALCIUM 8.7* 8.9 9.0 8.8* 8.7*   Liver Function Tests:  Recent Labs Lab 02/09/15 0340  AST 37  ALT 30  ALKPHOS 111  BILITOT 3.2*  PROT 7.3  ALBUMIN 3.8   No results for input(s): LIPASE, AMYLASE in the last 168 hours. No results for input(s): AMMONIA in the last 168 hours. CBC:  Recent Labs Lab 02/04/15 0440 02/05/15 0420 02/06/15 0630 02/07/15 0500 02/09/15 0340  WBC 17.3* 17.2* 16.1* 16.1* 16.2*  NEUTROABS  --   --   --   --  10.5*  HGB 6.6* 6.4* 6.6* 6.6* 6.9*  HCT 18.9* 18.9* 19.3* 19.9* 20.5*  MCV 86.7 86.3 87.7 87.7 88.4  PLT 481* 452* 442* 481* 501*   Cardiac Enzymes: No results for input(s): CKTOTAL, CKMB, CKMBINDEX, TROPONINI in the last 168 hours. BNP (last 3 results)  Recent Labs  08/29/14 0810 09/10/14 0612  11/30/14 0910  BNP 1117.0* 549.5* 639.7*    ProBNP (last 3 results) No results for input(s): PROBNP in the last 8760 hours.  CBG: No results for input(s): GLUCAP in the last 168 hours.  Recent Results (from the past 240 hour(s))  MRSA PCR Screening     Status: Abnormal   Collection Time: 02/03/15  2:12 PM  Result Value Ref Range Status   MRSA by PCR POSITIVE (A) NEGATIVE Final    Comment:        The GeneXpert MRSA Assay (FDA approved for NASAL specimens only), is one component of a comprehensive MRSA colonization surveillance program. It is not intended to diagnose MRSA infection nor to guide or monitor treatment for MRSA infections. RESULT CALLED TO, READ BACK BY AND VERIFIED WITH: MELTON,A @ [id number] ON [id number] BY POTEAT,S      Studies: Dg Chest 2 View  01/31/2015  CLINICAL DATA:  Patient with sickle cell disease and acute chest pain. Left rib pain. EXAM: CHEST  2 VIEW COMPARISON:  Chest radiograph 01/26/2015. FINDINGS: Monitoring leads overlie the patient. Left anterior chest wall Port-A-Cath is present with tip projecting over the superior cavoatrial junction, stable. Stable cardiomegaly. Unchanged bilateral coarse interstitial pulmonary opacities. No new area of superimposed pulmonary consolidation. No pleural effusion or pneumothorax. Regional skeleton is unremarkable. Cholecystectomy clips. IMPRESSION: No active cardiopulmonary disease. Electronically Signed   By: Lovey Newcomer M.D.   On: 01/31/2015 18:37   Dg Chest 2 View  01/26/2015  CLINICAL DATA:  Acute onset of bilateral generalized chest pain. Initial encounter. EXAM: CHEST  2 VIEW COMPARISON:  Chest radiograph from 01/14/2015 FINDINGS: The lungs are well-aerated. Peribronchial thickening is noted. There is no evidence of focal opacification, pleural effusion or pneumothorax. The heart is enlarged. A left-sided chest port is noted ending about the distal SVC. No acute osseous abnormalities are seen. Clips are noted within  the right upper quadrant, reflecting prior cholecystectomy. IMPRESSION: Peribronchial thickening noted. Lungs otherwise grossly clear. Cardiomegaly noted. Electronically Signed   By: Garald Balding M.D.   On: 01/26/2015 01:30   Dg Chest Portable 1 View  01/14/2015  CLINICAL DATA:  Sickle cell pain in the ribs and low back. Intermittently for the last week. EXAM: PORTABLE CHEST 1 VIEW COMPARISON:  01/08/2015 FINDINGS: Cardiac enlargement with mild pulmonary vascular congestion similar to prior study. Mild interstitial fibrosis. No evidence of edema or consolidation. No blunting of costophrenic angles. No pneumothorax. Mediastinal contours appear intact. Power port type central venous catheter with tip over the cavoatrial junction region. The sclerosis in the proximal left humerus consistent with bone infarct. IMPRESSION: Cardiac enlargement with mild pulmonary vascular congestion. No focal consolidation. Electronically Signed   By: Lucienne Capers M.D.   On: 01/14/2015 22:11    Scheduled Meds: . sodium chloride  10 mL/hr Intravenous Once  . aspirin  81 mg Oral Daily  . cholecalciferol  2,000 Units Oral Daily  . enoxaparin  120 mg Subcutaneous  [id number]  . folic acid  1 mg Oral q morning - 10a  . HYDROmorphone   Intravenous 6 times per day  . HYDROmorphone  4 mg Oral Q4H  . hydroxyurea  500 mg Oral Daily  . lisinopril  10 mg Oral Daily  . metoprolol succinate  50 mg Oral Daily  . morphine  30 mg Oral Q12H  . pantoprazole  40 mg Oral Q1200  . pneumococcal 23 valent vaccine  0.5 mL Intramuscular Tomorrow-1000  . potassium chloride SA  20 mEq Oral q morning - 10a  . senna-docusate  1 tablet Oral BID   Continuous Infusions: . dextrose 5 % and 0.45% NaCl 50 mL/hr at 02/08/15 2153    Time spent 35 minutes.

## 2015-02-10 DIAGNOSIS — J9621 Acute and chronic respiratory failure with hypoxia: Secondary | ICD-10-CM

## 2015-02-10 DIAGNOSIS — G894 Chronic pain syndrome: Secondary | ICD-10-CM

## 2015-02-10 MED ORDER — HYDROMORPHONE HCL 2 MG/ML IJ SOLN
2.0000 mg | INTRAMUSCULAR | Status: AC
  Administered 2015-02-10 (×3): 2 mg via INTRAVENOUS
  Filled 2015-02-10 (×3): qty 1

## 2015-02-10 MED ORDER — HYDROMORPHONE HCL 2 MG/ML IJ SOLN
2.0000 mg | INTRAMUSCULAR | Status: DC | PRN
  Administered 2015-02-10 – 2015-02-12 (×12): 2 mg via INTRAVENOUS
  Filled 2015-02-10 (×8): qty 1

## 2015-02-10 NOTE — Care Management Important Message (Signed)
Important Message  Patient Details  Name: [name] MRN: [id number] Date of Birth: 11/20/1980   Medicare Important Message Given:  Yes    [name] 02/10/2015, 10:02 AMImportant Message  Patient Details  Name: [name] MRN: [id number] Date of Birth: 1980/02/25   Medicare Important Message Given:  Yes    [name] 02/10/2015, 10:02 AM

## 2015-02-10 NOTE — Progress Notes (Signed)
SICKLE CELL SERVICE PROGRESS NOTE  [name] [id number] DOB: [birth year] DOA: 01/31/2015 PCP: [name], MD  Assessment/Plan: Principal Problem:   Sickle cell crisis ([place]) Active Problems:   Chronic anticoagulation   Chronic respiratory failure with hypoxia (HCC)   Essential hypertension   Chronic pain syndrome   Embolism, pulmonary with infarction (HCC)   Chronic atrial fibrillation (HCC)   Anemia  1. Hb SS with crisis: PT reports that pain has escalated overnight up to 7-8/10 and localized to ribs and left shoulder. Pt has used 26 mg with 49/54:demands/deliveries. Will continue PCA at current dose and add clinician assisted doses. Continue scheduled oral Dilaudid. Encourage incentive spirometer and ambulation. 2. Leukocytosis: Pt has a chronic leukocytosis of about 15-17K. Currently at baseline. 3. Anemia of chronic disease: Hb currently at baseline. Will check Hb, LDH and reticulocytes tomorrow. Given his current clinical condition he may require a transfusion if hemolysis continues.  4. Chronic pain: Contine MS Contin 5. Chronic Respiratory Failure with Hypoxia: Pt currently at baseline Oxygen requirements.  6. Chronic Anticoagulation: Pt on lifelong anticoagulation. No evidence of bleeding Xarelto continued.    Code Status: Full Code Family Communication: Called girlfriend Camia at [phone] to updated at patient's request.  Disposition Plan: Anticipate discharge in next 48 hours.  Artemis Koller A.  Pager [phone]. If 7PM-7AM, please contact night-coverage.  02/10/2015, 12:06 PM  LOS: 10 days   Admitting History: Pt reports pain in ribs at 7-8/10 compared to baseline of 5-6/10. Pt states that he finds himself falling asleep after 2-3 PCA bolus pushes and then the pain has escalated upon awakening 40-50 minutes later. Last BM 48 hours ago.  Consultants:  None  Procedures:  None  Antibiotics:  None   Objective: Filed Vitals:   02/10/15  0415 02/10/15 0550 02/10/15 0737 02/10/15 1038  BP:  106/61  101/52  Pulse:  73  75  Temp:  98.6 F (37 C)  98.1 F (36.7 C)  TempSrc:  Oral  Oral  Resp: 18 18 18 15   Height:      Weight:      SpO2: 99% 99% 99% 99%   Weight change:   Intake/Output Summary (Last 24 hours) at 02/10/15 1206 Last data filed at 02/10/15 0800  Gross per 24 hour  Intake    780 ml  Output   2425 ml  Net  -1645 ml    General: Alert, awake, oriented x3, in moderate distress secondary to pain. HEENT: [place]/AT PEERL, EOMI, increased icterus since yesterday. Neck: Trachea midline,  no masses, no thyromegal,y no JVD, no carotid bruit OROPHARYNX:  Moist, No exudate/ erythema/lesions.  Heart: Regular rate and rhythm, without murmurs, rubs, gallops, PMI non-displaced, no heaves or thrills on palpation.  Lungs: Clear to auscultation, no wheezing or rhonchi noted. No increased vocal fremitus resonant to percussion  Abdomen: Soft, nontender, nondistended, positive bowel sounds, no masses no hepatosplenomegaly noted..  Neuro: No focal neurological deficits noted cranial nerves II through XII grossly intact. Strength at functional baseline in BUE's and BLE's.  Musculoskeletal: No warm swelling or erythema around joints, no spinal tenderness noted. Psychiatric: Patient alert and oriented x3, good insight and cognition, good recent to remote recall.    Data Reviewed: Basic Metabolic Panel:  Recent Labs Lab 02/04/15 0440 02/05/15 0420 02/06/15 0630 02/07/15 0500 02/09/15 0340  NA 136 138 139 138 138  K 4.8 4.7 4.4 4.4 4.3  CL 105 108 107 105 105  CO2 23 23 24 25 25   GLUCOSE  125* 135* 103* 105* 133*  BUN 13 12 8 9 11   CREATININE 0.71 0.74 0.66 0.62 0.67  CALCIUM 8.7* 8.9 9.0 8.8* 8.7*   Liver Function Tests:  Recent Labs Lab 02/09/15 0340  AST 37  ALT 30  ALKPHOS 111  BILITOT 3.2*  PROT 7.3  ALBUMIN 3.8   No results for input(s): LIPASE, AMYLASE in the last 168 hours. No results for input(s):  AMMONIA in the last 168 hours. CBC:  Recent Labs Lab 02/04/15 0440 02/05/15 0420 02/06/15 0630 02/07/15 0500 02/09/15 0340  WBC 17.3* 17.2* 16.1* 16.1* 16.2*  NEUTROABS  --   --   --   --  10.5*  HGB 6.6* 6.4* 6.6* 6.6* 6.9*  HCT 18.9* 18.9* 19.3* 19.9* 20.5*  MCV 86.7 86.3 87.7 87.7 88.4  PLT 481* 452* 442* 481* 501*   Cardiac Enzymes: No results for input(s): CKTOTAL, CKMB, CKMBINDEX, TROPONINI in the last 168 hours. BNP (last 3 results)  Recent Labs  08/29/14 0810 09/10/14 0612 11/30/14 0910  BNP 1117.0* 549.5* 639.7*    ProBNP (last 3 results) No results for input(s): PROBNP in the last 8760 hours.  CBG: No results for input(s): GLUCAP in the last 168 hours.  Recent Results (from the past 240 hour(s))  MRSA PCR Screening     Status: Abnormal   Collection Time: 02/03/15  2:12 PM  Result Value Ref Range Status   MRSA by PCR POSITIVE (A) NEGATIVE Final    Comment:        The GeneXpert MRSA Assay (FDA approved for NASAL specimens only), is one component of a comprehensive MRSA colonization surveillance program. It is not intended to diagnose MRSA infection nor to guide or monitor treatment for MRSA infections. RESULT CALLED TO, READ BACK BY AND VERIFIED WITH: MELTON,A @ [id number] ON [id number] BY POTEAT,S      Studies: Dg Chest 2 View  01/31/2015  CLINICAL DATA:  Patient with sickle cell disease and acute chest pain. Left rib pain. EXAM: CHEST  2 VIEW COMPARISON:  Chest radiograph 01/26/2015. FINDINGS: Monitoring leads overlie the patient. Left anterior chest wall Port-A-Cath is present with tip projecting over the superior cavoatrial junction, stable. Stable cardiomegaly. Unchanged bilateral coarse interstitial pulmonary opacities. No new area of superimposed pulmonary consolidation. No pleural effusion or pneumothorax. Regional skeleton is unremarkable. Cholecystectomy clips. IMPRESSION: No active cardiopulmonary disease. Electronically Signed   By: Lovey Newcomer M.D.    On: 01/31/2015 18:37   Dg Chest 2 View  01/26/2015  CLINICAL DATA:  Acute onset of bilateral generalized chest pain. Initial encounter. EXAM: CHEST  2 VIEW COMPARISON:  Chest radiograph from 01/14/2015 FINDINGS: The lungs are well-aerated. Peribronchial thickening is noted. There is no evidence of focal opacification, pleural effusion or pneumothorax. The heart is enlarged. A left-sided chest port is noted ending about the distal SVC. No acute osseous abnormalities are seen. Clips are noted within the right upper quadrant, reflecting prior cholecystectomy. IMPRESSION: Peribronchial thickening noted. Lungs otherwise grossly clear. Cardiomegaly noted. Electronically Signed   By: Garald Balding M.D.   On: 01/26/2015 01:30   Dg Chest Portable 1 View  01/14/2015  CLINICAL DATA:  Sickle cell pain in the ribs and low back. Intermittently for the last week. EXAM: PORTABLE CHEST 1 VIEW COMPARISON:  01/08/2015 FINDINGS: Cardiac enlargement with mild pulmonary vascular congestion similar to prior study. Mild interstitial fibrosis. No evidence of edema or consolidation. No blunting of costophrenic angles. No pneumothorax. Mediastinal contours appear intact. Power port type central venous  catheter with tip over the cavoatrial junction region. The sclerosis in the proximal left humerus consistent with bone infarct. IMPRESSION: Cardiac enlargement with mild pulmonary vascular congestion. No focal consolidation. Electronically Signed   By: Lucienne Capers M.D.   On: 01/14/2015 22:11    Scheduled Meds: . sodium chloride  10 mL/hr Intravenous Once  . aspirin  81 mg Oral Daily  . cholecalciferol  2,000 Units Oral Daily  . enoxaparin  120 mg Subcutaneous Q24H  . folic acid  1 mg Oral q morning - 10a  . HYDROmorphone   Intravenous 6 times per day  .  HYDROmorphone (DILAUDID) injection  2 mg Intravenous Q2H  . hydroxyurea  500 mg Oral Daily  . lisinopril  10 mg Oral Daily  . metoprolol succinate  50 mg Oral Daily   . morphine  30 mg Oral Q12H  . pantoprazole  40 mg Oral Q1200  . pneumococcal 23 valent vaccine  0.5 mL Intramuscular Tomorrow-1000  . potassium chloride SA  20 mEq Oral q morning - 10a  . senna-docusate  1 tablet Oral BID   Continuous Infusions: . dextrose 5 % and 0.45% NaCl 50 mL/hr at 02/09/15 1747    Time spent 40 minutes.

## 2015-02-11 DIAGNOSIS — J9611 Chronic respiratory failure with hypoxia: Secondary | ICD-10-CM

## 2015-02-11 LAB — CBC WITH DIFFERENTIAL/PLATELET
BASOS PCT: 0 %
Basophils Absolute: 0 10*3/uL (ref 0.0–0.1)
EOS PCT: 6 %
Eosinophils Absolute: 0.8 10*3/uL — ABNORMAL HIGH (ref 0.0–0.7)
HEMATOCRIT: 18.7 % — AB (ref 39.0–52.0)
Hemoglobin: 6.3 g/dL — CL (ref 13.0–17.0)
Lymphocytes Relative: 18 %
Lymphs Abs: 2.3 10*3/uL (ref 0.7–4.0)
MCH: 29.9 pg (ref 26.0–34.0)
MCHC: 33.7 g/dL (ref 30.0–36.0)
MCV: 88.6 fL (ref 78.0–100.0)
MONOS PCT: 17 %
Monocytes Absolute: 2.1 10*3/uL — ABNORMAL HIGH (ref 0.1–1.0)
NEUTROS ABS: 7.3 10*3/uL (ref 1.7–7.7)
Neutrophils Relative %: 59 %
Platelets: 450 10*3/uL — ABNORMAL HIGH (ref 150–400)
RBC: 2.11 MIL/uL — ABNORMAL LOW (ref 4.22–5.81)
RDW: 19.9 % — AB (ref 11.5–15.5)
WBC: 12.5 10*3/uL — ABNORMAL HIGH (ref 4.0–10.5)

## 2015-02-11 LAB — RETICULOCYTES
RBC.: 2.11 MIL/uL — ABNORMAL LOW (ref 4.22–5.81)
RETIC COUNT ABSOLUTE: 97.1 10*3/uL (ref 19.0–186.0)
Retic Ct Pct: 4.6 % — ABNORMAL HIGH (ref 0.4–3.1)

## 2015-02-11 NOTE — Progress Notes (Signed)
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 Hb SS with crisis: Pt reports that pain has improved and is now 6-7/10 and localized to ribs and left shoulder. Pt has used 48 mg with 93/78:demands/deliveries. Will continue PCA at current dose and add clinician assisted doses. Continue scheduled oral Dilaudid. Encourage incentive spirometer and ambulation. 2. Leukocytosis Leukocytosis improved.No evidence of infection. Currently at baseline. 3. Anemia of chronic disease: Hb currently at baseline. Will check Hb, LDH and reticulocytes tomorrow.  4. Chronic pain: Contine MS Contin 5. Chronic Respiratory Failure with Hypoxia: Pt currently at baseline Oxygen requirements.  6. Chronic Anticoagulation: Pt on lifelong anticoagulation. No evidence of bleeding Xarelto continued.    Code Status: Full Code Family Communication: N/A Disposition Plan: Anticipate discharge tomorrow.  [name] A.  Pager [phone]. If 7PM-7AM, please contact night-coverage.  02/11/2015, 2:35 PM  LOS: 11 days   Admitting History: Pt reports pain in ribs at 6-7/10 compared to baseline of 5-6/10.   Consultants:  None  Procedures:  None  Antibiotics:  None   Objective: Filed Vitals:   02/11/15 1020 02/11/15 1033 02/11/15 1205 02/11/15 1422  BP: 100/72 97/60  106/62  Pulse: 98 80  70  Temp: 98 F (36.7 C)   98.3 F (36.8 C)  TempSrc: Oral   Oral  Resp: 16  16 16   Height:      Weight:      SpO2: 98%  100% 99%   Weight change:   Intake/Output Summary (Last 24 hours) at 02/11/15 1435 Last data filed at 02/11/15  1425  Gross per 24 hour  Intake    920 ml  Output   3225 ml  Net  -2305 ml    General: Alert, awake, oriented x3, in mild distress secondary to pain. HEENT: [place]/AT PEERL, EOMI, increased icterus since yesterday. Neck: Trachea midline,  no masses, no thyromegal,y no JVD, no carotid bruit OROPHARYNX:  Moist, No exudate/ erythema/lesions.  Heart: Regular rate and rhythm, without murmurs, rubs, gallops, PMI non-displaced, no heaves or thrills on palpation.  Lungs: Clear to auscultation, no wheezing or rhonchi noted. No increased vocal fremitus resonant to percussion  Abdomen: Soft, nontender, nondistended, positive bowel sounds, no masses no hepatosplenomegaly noted.  Neuro: No focal neurological deficits noted cranial nerves II through XII grossly intact. Strength at functional baseline in BUE's and BLE's.  Musculoskeletal: No warm swelling or erythema around joints, no spinal tenderness noted. Psychiatric: Patient alert and oriented x3, good insight and cognition, good recent to remote recall.    Data Reviewed: Basic Metabolic Panel:  Recent Labs Lab 02/05/15 0420 02/06/15 0630 02/07/15 0500 02/09/15 0340  NA 138 139 138 138  K 4.7 4.4 4.4 4.3  CL 108 107 105 105  CO2 23 24 25 25   GLUCOSE 135* 103* 105* 133*  BUN 12 8 9 11   CREATININE 0.74 0.66 0.62 0.67  CALCIUM 8.9 9.0 8.8* 8.7*   Liver Function Tests:  Recent Labs Lab 02/09/15 0340  AST 37  ALT 30  ALKPHOS 111  BILITOT 3.2*  PROT 7.3  ALBUMIN 3.8   No results for input(s): LIPASE, AMYLASE in the last 168  hours. No results for input(s): AMMONIA in the last 168 hours. CBC:  Recent Labs Lab 02/05/15 0420 02/06/15 0630 02/07/15 0500 02/09/15 0340 02/11/15 0531  WBC 17.2* 16.1* 16.1* 16.2* 12.5*  NEUTROABS  --   --   --  10.5* 7.3  HGB 6.4* 6.6* 6.6* 6.9* 6.3*  HCT 18.9* 19.3* 19.9* 20.5* 18.7*  MCV 86.3 87.7 87.7 88.4 88.6  PLT 452* 442* 481* 501* 450*   Cardiac Enzymes: No results for input(s):  CKTOTAL, CKMB, CKMBINDEX, TROPONINI in the last 168 hours. BNP (last 3 results)  Recent Labs  08/29/14 0810 09/10/14 0612 11/30/14 0910  BNP 1117.0* 549.5* 639.7*    ProBNP (last 3 results) No results for input(s): PROBNP in the last 8760 hours.  CBG: No results for input(s): GLUCAP in the last 168 hours.  Recent Results (from the past 240 hour(s))  MRSA PCR Screening     Status: Abnormal   Collection Time: 02/03/15  2:12 PM  Result Value Ref Range Status   MRSA by PCR POSITIVE (A) NEGATIVE Final    Comment:        The GeneXpert MRSA Assay (FDA approved for NASAL specimens only), is one component of a comprehensive MRSA colonization surveillance program. It is not intended to diagnose MRSA infection nor to guide or monitor treatment for MRSA infections. RESULT CALLED TO, READ BACK BY AND VERIFIED WITH: MELTON,A @ [id number] ON [id number] BY POTEAT,S      Studies: Dg Chest 2 View  01/31/2015  CLINICAL DATA:  Patient with sickle cell disease and acute chest pain. Left rib pain. EXAM: CHEST  2 VIEW COMPARISON:  Chest radiograph 01/26/2015. FINDINGS: Monitoring leads overlie the patient. Left anterior chest wall Port-A-Cath is present with tip projecting over the superior cavoatrial junction, stable. Stable cardiomegaly. Unchanged bilateral coarse interstitial pulmonary opacities. No new area of superimposed pulmonary consolidation. No pleural effusion or pneumothorax. Regional skeleton is unremarkable. Cholecystectomy clips. IMPRESSION: No active cardiopulmonary disease. Electronically Signed   By: Lovey Newcomer M.D.   On: 01/31/2015 18:37   Dg Chest 2 View  01/26/2015  CLINICAL DATA:  Acute onset of bilateral generalized chest pain. Initial encounter. EXAM: CHEST  2 VIEW COMPARISON:  Chest radiograph from 01/14/2015 FINDINGS: The lungs are well-aerated. Peribronchial thickening is noted. There is no evidence of focal opacification, pleural effusion or pneumothorax. The heart is  enlarged. A left-sided chest port is noted ending about the distal SVC. No acute osseous abnormalities are seen. Clips are noted within the right upper quadrant, reflecting prior cholecystectomy. IMPRESSION: Peribronchial thickening noted. Lungs otherwise grossly clear. Cardiomegaly noted. Electronically Signed   By: Garald Balding M.D.   On: 01/26/2015 01:30   Dg Chest Portable 1 View  01/14/2015  CLINICAL DATA:  Sickle cell pain in the ribs and low back. Intermittently for the last week. EXAM: PORTABLE CHEST 1 VIEW COMPARISON:  01/08/2015 FINDINGS: Cardiac enlargement with mild pulmonary vascular congestion similar to prior study. Mild interstitial fibrosis. No evidence of edema or consolidation. No blunting of costophrenic angles. No pneumothorax. Mediastinal contours appear intact. Power port type central venous catheter with tip over the cavoatrial junction region. The sclerosis in the proximal left humerus consistent with bone infarct. IMPRESSION: Cardiac enlargement with mild pulmonary vascular congestion. No focal consolidation. Electronically Signed   By: Lucienne Capers M.D.   On: 01/14/2015 22:11    Scheduled Meds: . sodium chloride  10 mL/hr Intravenous Once  . aspirin  81 mg Oral Daily  . cholecalciferol  2,000 Units Oral Daily  . enoxaparin  120 mg Subcutaneous Q24H  . folic acid  1 mg Oral q morning - 10a  . HYDROmorphone   Intravenous 6 times per day  . hydroxyurea  500 mg Oral Daily  . lisinopril  10 mg Oral Daily  . metoprolol succinate  50 mg Oral Daily  . morphine  30 mg Oral Q12H  . pantoprazole  40 mg Oral Q1200  . pneumococcal 23 valent vaccine  0.5 mL Intramuscular Tomorrow-1000  . potassium chloride SA  20 mEq Oral q morning - 10a  . senna-docusate  1 tablet Oral BID   Continuous Infusions: . dextrose 5 % and 0.45% NaCl 50 mL/hr at 02/09/15 1747    Time spent 25 minutes.

## 2015-02-12 LAB — CBC WITH DIFFERENTIAL/PLATELET
Basophils Absolute: 0.1 10*3/uL (ref 0.0–0.1)
Basophils Relative: 1 %
EOS PCT: 5 %
Eosinophils Absolute: 0.6 10*3/uL (ref 0.0–0.7)
HEMATOCRIT: 19.6 % — AB (ref 39.0–52.0)
HEMOGLOBIN: 6.5 g/dL — AB (ref 13.0–17.0)
Lymphocytes Relative: 17 %
Lymphs Abs: 2.1 10*3/uL (ref 0.7–4.0)
MCH: 29.4 pg (ref 26.0–34.0)
MCHC: 33.2 g/dL (ref 30.0–36.0)
MCV: 88.7 fL (ref 78.0–100.0)
MONOS PCT: 15 %
Monocytes Absolute: 1.8 10*3/uL — ABNORMAL HIGH (ref 0.1–1.0)
NEUTROS PCT: 62 %
Neutro Abs: 7.5 10*3/uL (ref 1.7–7.7)
Platelets: 522 10*3/uL — ABNORMAL HIGH (ref 150–400)
RBC: 2.21 MIL/uL — AB (ref 4.22–5.81)
RDW: 19.8 % — ABNORMAL HIGH (ref 11.5–15.5)
WBC: 12.1 10*3/uL — AB (ref 4.0–10.5)

## 2015-02-12 LAB — RETICULOCYTES
RBC.: 2.21 MIL/uL — AB (ref 4.22–5.81)
RETIC COUNT ABSOLUTE: 84 10*3/uL (ref 19.0–186.0)
Retic Ct Pct: 3.8 % — ABNORMAL HIGH (ref 0.4–3.1)

## 2015-02-12 MED ORDER — HEPARIN SOD (PORK) LOCK FLUSH 100 UNIT/ML IV SOLN
500.0000 [IU] | Freq: Once | INTRAVENOUS | Status: AC
  Administered 2015-02-12: 500 [IU] via INTRAVENOUS
  Filled 2015-02-12: qty 5

## 2015-02-12 NOTE — Discharge Summary (Signed)
[name] MRN: [id number] DOB/AGE: May 09, 1980 35 y.o.  Admit date: 01/31/2015 Discharge date: 02/12/2015  Primary Care Physician:  [name], MD   Discharge Diagnoses:   Patient Active Problem List   Diagnosis Date Noted  . Cor pulmonale, chronic ([place]) 08/25/2014    Priority: High  . Pulmonary HTN ([place]) 06/18/2013    Priority: High  . Functional asplenia     Priority: High  . Anemia of chronic disease 06/25/2014    Priority: Medium  . Chronic respiratory failure with hypoxia ([place]) 03/14/2014    Priority: Medium  . Hb-SS disease with crisis ([place]) 01/22/2014    Priority: Medium  . Chronic anticoagulation 08/22/2013    Priority: Medium  . Avascular necrosis ([place])     Priority: Low  . Anemia 01/31/2015  . Symptomatic anemia   . Syncope 11/30/2014  . Chest pain   . Hb-SS disease without crisis ([place]) 10/07/2014  . Acute respiratory failure with hypoxia ([place])   . Acute chest syndrome in sickle crisis ([place])   . PVC's (premature ventricular contractions) 09/10/2014  . Abnormal EKG 09/10/2014  . Hypoxemia   . Chronic atrial fibrillation ([place]) 09/09/2014  . 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 Bacteremia   . Elevated troponin I level 08/29/2014  . Ankle edema 07/07/2014  . Sickle cell anemia ([place]) 06/25/2014  . PAH (pulmonary artery hypertension) ([place]) 03/18/2014  . Paralytic strabismus, external ophthalmoplegia   . Chronic pain syndrome 12/12/2013  . Essential hypertension 08/22/2013  . Vitamin D deficiency 02/13/2013  . Embolism, pulmonary with infarction ([place]) 07/09/2012  . Hx of pulmonary embolus 06/29/2012  . Hemochromatosis 12/14/2011    DISCHARGE MEDICATION:   Medication List    TAKE these medications        aspirin 81 MG chewable tablet  Chew 1 tablet (81 mg total) by mouth daily.     enoxaparin 120 MG/0.8ML injection  Commonly known as:  LOVENOX  Inject 0.8 mLs (120 mg total) into the skin daily.     folic acid 1 MG tablet  Commonly  known as:  FOLVITE  Take 1 tablet (1 mg total) by mouth every morning.     HYDROmorphone 4 MG tablet  Commonly known as:  DILAUDID  Take 1 tablet (4 mg total) by mouth every 4 (four) hours as needed for severe pain.     hydroxyurea 500 MG capsule  Commonly known as:  HYDREA  Take 1 capsule (500 mg total) by mouth daily. May take with food to minimize GI side effects.     lisinopril 10 MG tablet  Commonly known as:  PRINIVIL,ZESTRIL  Take 1 tablet (10 mg total) by mouth daily.     metoprolol succinate 50 MG 24 hr tablet  Commonly known as:  TOPROL-XL  Take 1 tablet (50 mg total) by mouth daily.     morphine 30 MG 12 hr tablet  Commonly known as:  MS CONTIN  Take 1 tablet (30 mg total) by mouth every 12 (twelve) hours.     potassium chloride SA 20 MEQ tablet  Commonly known as:  K-DUR,KLOR-CON  Take 1 tablet (20 mEq total) by mouth every morning.     Vitamin D 2000 units tablet  Take 1 tablet (2,000 Units total) by mouth daily.     zolpidem 10 MG tablet  Commonly known as:  AMBIEN  Take 1 tablet (10 mg total) by mouth at bedtime as needed for sleep.  Consults:     SIGNIFICANT DIAGNOSTIC STUDIES:  Dg Chest 2 View  01/31/2015  CLINICAL DATA:  Patient with sickle cell disease and acute chest pain. Left rib pain. EXAM: CHEST  2 VIEW COMPARISON:  Chest radiograph 01/26/2015. FINDINGS: Monitoring leads overlie the patient. Left anterior chest wall Port-A-Cath is present with tip projecting over the superior cavoatrial junction, stable. Stable cardiomegaly. Unchanged bilateral coarse interstitial pulmonary opacities. No new area of superimposed pulmonary consolidation. No pleural effusion or pneumothorax. Regional skeleton is unremarkable. Cholecystectomy clips. IMPRESSION: No active cardiopulmonary disease. Electronically Signed   By: Lovey Newcomer M.D.   On: 01/31/2015 18:37   Dg Chest 2 View  01/26/2015  CLINICAL DATA:  Acute onset of bilateral generalized chest pain.  Initial encounter. EXAM: CHEST  2 VIEW COMPARISON:  Chest radiograph from 01/14/2015 FINDINGS: The lungs are well-aerated. Peribronchial thickening is noted. There is no evidence of focal opacification, pleural effusion or pneumothorax. The heart is enlarged. A left-sided chest port is noted ending about the distal SVC. No acute osseous abnormalities are seen. Clips are noted within the right upper quadrant, reflecting prior cholecystectomy. IMPRESSION: Peribronchial thickening noted. Lungs otherwise grossly clear. Cardiomegaly noted. Electronically Signed   By: Garald Balding M.D.   On: 01/26/2015 01:30   Dg Chest Portable 1 View  01/14/2015  CLINICAL DATA:  Sickle cell pain in the ribs and low back. Intermittently for the last week. EXAM: PORTABLE CHEST 1 VIEW COMPARISON:  01/08/2015 FINDINGS: Cardiac enlargement with mild pulmonary vascular congestion similar to prior study. Mild interstitial fibrosis. No evidence of edema or consolidation. No blunting of costophrenic angles. No pneumothorax. Mediastinal contours appear intact. Power port type central venous catheter with tip over the cavoatrial junction region. The sclerosis in the proximal left humerus consistent with bone infarct. IMPRESSION: Cardiac enlargement with mild pulmonary vascular congestion. No focal consolidation. Electronically Signed   By: Lucienne Capers M.D.   On: 01/14/2015 22:11     Recent Results (from the past 240 hour(s))  MRSA PCR Screening     Status: Abnormal   Collection Time: 02/03/15  2:12 PM  Result Value Ref Range Status   MRSA by PCR POSITIVE (A) NEGATIVE Final    Comment:        The GeneXpert MRSA Assay (FDA approved for NASAL specimens only), is one component of a comprehensive MRSA colonization surveillance program. It is not intended to diagnose MRSA infection nor to guide or monitor treatment for MRSA infections. RESULT CALLED TO, READ BACK BY AND VERIFIED WITH: MELTON,A @ [id number] ON [id number] BY POTEAT,S      BRIEF ADMITTING H & P: [name] is a 35 y.o. male with a history of Sickle Cell Anemia, PAH, Chronic Pain, and Recent Pulmonary Embolism who presents to the ED with complaints of 10/10 pain in both sides of his Chest and ABD x 3 days. He has had increased SOB, and has NCO2 at home that is PRN. The pain has not been controlled with his pain regimen. He was evaluated and administered multiple doses of IV Dilaudid with only mild improvement and was referred for admission. His hemoglobin level was 6.2, and a transfusion of 1 unit was ordered. He was referred for admission.    Hospital Course:  Present on Admission:  . Sickle cell crisis [facility]): Pt was admitted with Sickle Cell Crisis. He was initially placed on a dose of pain medication that was significantly less than was equivalent to what he takes at home. This  dosing was continued for 3 days without any improvement and rather a worsening of his pain. The dosing of his opiates were finally adjusted after several day of hospitalization and pain began to improve. As the pain improved the IV analgesics were weaned and patient was transitioned to oral analgesics. He is discharged home on his pre hospital regimen of MS Contin and oral short acting Dilaudid on a PRN basis.   . Anemia of chronic disease: Pt was at baseline Hb and had a significant initial hemolysis with increase in LDH. However he had a robust reticulocytosis and at time of discharge his Hb was 6.5 which is his baseline. He needs to have Hb checked ina few days to evaluate Hydrea effect.   . Chronic respiratory failure with hypoxia ([place]): Pt remained at his baseline Oxygen requirement throughout hospital course.   . 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 He is continued on the dose of 120 mg daily as he had a LMW heparin level was found to be low at 110 mg.   . Chronic pain syndrome: Continue MS Contin. Managed by PMD as out  patient.    Disposition and Follow-up:  Pt discharged home in good condition. He is to follow up with his PMD for labs in 2-3 days. Pt declined appointment and states that he will call to make appointment once he has checked with his girlfriend who is to go with him.     Discharge Instructions    Activity as tolerated - No restrictions    Complete by:  As directed      Diet general    Complete by:  As directed            DISCHARGE EXAM: General: Alert, awake, oriented x3, in NAD.  Vital Signs: BP 100/60, HR 71, T 98.6 F (37 C), temperature source Oral, RR14, height 6' (1.829 m), weight 162 lb (73.483 kg), SpO2 96 % on 3 l/min. HEENT: [place]/AT PEERL, EOMI, anicteric Neck: Trachea midline, no masses, no thyromegal,y no JVD, no carotid bruit OROPHARYNX: Moist, No exudate/ erythema/lesions.  Heart: Regular rate and rhythm, without murmurs, rubs, gallops or S3. PMI non-displaced. Exam reveals no decreased pulses. Pulmonary/Chest: Normal effort. Breath sounds normal. No. Apnea. Clear to auscultation,no stridor,  no wheezing and no rhonchi noted. No respiratory distress and no tenderness noted. Abdomen: Soft, nontender, nondistended, normal bowel sounds, no masses no hepatosplenomegaly noted. No fluid wave and no ascites. There is no guarding or rebound. Neuro: Alert and oriented to person, place and time. Normal motor skills, Displays no atrophy or tremors and exhibits normal muscle tone.  No focal neurological deficits noted cranial nerves II through XII grossly intact. No sensory deficit noted. Strength at baseline in bilateral upper and lower extremities. Gait normal. Musculoskeletal: No warm swelling or erythema around joints, no spinal tenderness noted. Psychiatric: Patient alert and oriented x3, good insight and cognition, good recent to remote recall. Mood, memory, affect and judgement normal Lymph node survey: No cervical axillary or inguinal lymphadenopathy noted. Skin: Skin is  warm and dry. No bruising, no ecchymosis and no rash noted. Pt is not diaphoretic. No erythema. No pallor     Recent Labs  02/11/15 0531 02/12/15 1030  WBC 12.5* 12.1*  NEUTROABS 7.3 7.5  HGB 6.3* 6.5*  HCT 18.7* 19.6*  MCV 88.6 88.7  PLT 450* 522*     Total time spent including face to face and decision making was greater than 30 minutes  Signed: Sedric Guia A. 02/12/2015, 12:58 PM

## 2015-02-16 ENCOUNTER — Telehealth (HOSPITAL_COMMUNITY): Payer: Self-pay | Admitting: *Deleted

## 2015-02-16 ENCOUNTER — Non-Acute Institutional Stay (HOSPITAL_COMMUNITY)
Admission: AD | Admit: 2015-02-16 | Discharge: 2015-02-16 | Disposition: A | Discharge status: Home / Self Care | Payer: Medicare Other | Attending: Internal Medicine | Admitting: Internal Medicine

## 2015-02-16 ENCOUNTER — Encounter (HOSPITAL_COMMUNITY): Payer: Self-pay

## 2015-02-16 DIAGNOSIS — Z7901 Long term (current) use of anticoagulants: Secondary | ICD-10-CM | POA: Insufficient documentation

## 2015-02-16 DIAGNOSIS — Z86711 Personal history of pulmonary embolism: Secondary | ICD-10-CM | POA: Insufficient documentation

## 2015-02-16 DIAGNOSIS — Z87891 Personal history of nicotine dependence: Secondary | ICD-10-CM | POA: Insufficient documentation

## 2015-02-16 DIAGNOSIS — Z79891 Long term (current) use of opiate analgesic: Secondary | ICD-10-CM | POA: Insufficient documentation

## 2015-02-16 DIAGNOSIS — Z96641 Presence of right artificial hip joint: Secondary | ICD-10-CM | POA: Insufficient documentation

## 2015-02-16 DIAGNOSIS — D5700 Hb-SS disease with crisis, unspecified: Secondary | ICD-10-CM | POA: Diagnosis present

## 2015-02-16 DIAGNOSIS — Z86718 Personal history of other venous thrombosis and embolism: Secondary | ICD-10-CM | POA: Diagnosis not present

## 2015-02-16 DIAGNOSIS — Z79899 Other long term (current) drug therapy: Secondary | ICD-10-CM | POA: Diagnosis not present

## 2015-02-16 DIAGNOSIS — I10 Essential (primary) hypertension: Secondary | ICD-10-CM | POA: Insufficient documentation

## 2015-02-16 DIAGNOSIS — Z7982 Long term (current) use of aspirin: Secondary | ICD-10-CM | POA: Diagnosis not present

## 2015-02-16 DIAGNOSIS — R0781 Pleurodynia: Secondary | ICD-10-CM | POA: Diagnosis present

## 2015-02-16 LAB — COMPREHENSIVE METABOLIC PANEL
ALT: 35 U/L (ref 17–63)
ANION GAP: 13 (ref 5–15)
AST: 47 U/L — ABNORMAL HIGH (ref 15–41)
Albumin: 4 g/dL (ref 3.5–5.0)
Alkaline Phosphatase: 115 U/L (ref 38–126)
BUN: 21 mg/dL — ABNORMAL HIGH (ref 6–20)
CHLORIDE: 109 mmol/L (ref 101–111)
CO2: 21 mmol/L — AB (ref 22–32)
Calcium: 9.3 mg/dL (ref 8.9–10.3)
Creatinine, Ser: 0.86 mg/dL (ref 0.61–1.24)
Glucose, Bld: 130 mg/dL — ABNORMAL HIGH (ref 65–99)
POTASSIUM: 3.5 mmol/L (ref 3.5–5.1)
SODIUM: 143 mmol/L (ref 135–145)
Total Bilirubin: 4.4 mg/dL — ABNORMAL HIGH (ref 0.3–1.2)
Total Protein: 7.9 g/dL (ref 6.5–8.1)

## 2015-02-16 LAB — CBC WITH DIFFERENTIAL/PLATELET
BASOS ABS: 0.2 10*3/uL — AB (ref 0.0–0.1)
Basophils Relative: 1 %
EOS ABS: 0.4 10*3/uL (ref 0.0–0.7)
Eosinophils Relative: 2 %
HCT: 17.5 % — ABNORMAL LOW (ref 39.0–52.0)
Hemoglobin: 5.9 g/dL — CL (ref 13.0–17.0)
LYMPHS PCT: 19 %
Lymphs Abs: 4.2 10*3/uL — ABNORMAL HIGH (ref 0.7–4.0)
MCH: 30.1 pg (ref 26.0–34.0)
MCHC: 33.7 g/dL (ref 30.0–36.0)
MCV: 89.3 fL (ref 78.0–100.0)
Monocytes Absolute: 2.2 10*3/uL — ABNORMAL HIGH (ref 0.1–1.0)
Monocytes Relative: 10 %
NEUTROS ABS: 15.1 10*3/uL — AB (ref 1.7–7.7)
NEUTROS PCT: 68 %
PLATELETS: 540 10*3/uL — AB (ref 150–400)
RBC: 1.96 MIL/uL — AB (ref 4.22–5.81)
RDW: 21.2 % — ABNORMAL HIGH (ref 11.5–15.5)
WBC: 22.1 10*3/uL — AB (ref 4.0–10.5)
nRBC: 1 /100 WBC — ABNORMAL HIGH

## 2015-02-16 LAB — RETICULOCYTES
RBC.: 1.96 MIL/uL — AB (ref 4.22–5.81)
RETIC CT PCT: 10 % — AB (ref 0.4–3.1)
Retic Count, Absolute: 196 10*3/uL — ABNORMAL HIGH (ref 19.0–186.0)

## 2015-02-16 LAB — LACTATE DEHYDROGENASE: LDH: 486 U/L — AB (ref 98–192)

## 2015-02-16 LAB — PREPARE RBC (CROSSMATCH)

## 2015-02-16 MED ORDER — SODIUM CHLORIDE 0.9 % IJ SOLN: 9.0000 mL | INTRAMUSCULAR | Status: DC | PRN

## 2015-02-16 MED ORDER — NALOXONE HCL 0.4 MG/ML IJ SOLN: 0.4000 mg | INTRAMUSCULAR | Status: DC | PRN

## 2015-02-16 MED ORDER — KETOROLAC TROMETHAMINE 30 MG/ML IJ SOLN
30.0000 mg | Freq: Once | INTRAMUSCULAR | Status: AC
  Administered 2015-02-16: 30 mg via INTRAVENOUS
  Filled 2015-02-16: qty 1

## 2015-02-16 MED ORDER — DIPHENHYDRAMINE HCL 12.5 MG/5ML PO ELIX: 12.5000 mg | ORAL_SOLUTION | Freq: Four times a day (QID) | ORAL | Status: DC | PRN

## 2015-02-16 MED ORDER — HEPARIN SOD (PORK) LOCK FLUSH 100 UNIT/ML IV SOLN: 500.0000 [IU] | INTRAVENOUS | Status: DC | PRN

## 2015-02-16 MED ORDER — DIPHENHYDRAMINE HCL 50 MG/ML IJ SOLN
12.5000 mg | Freq: Four times a day (QID) | INTRAMUSCULAR | Status: DC | PRN
  Administered 2015-02-16: 12.5 mg via INTRAVENOUS
  Filled 2015-02-16 (×2): qty 0.25

## 2015-02-16 MED ORDER — DEXTROSE-NACL 5-0.45 % IV SOLN
INTRAVENOUS | Status: DC
Start: 2015-02-16 — End: 2015-02-16
  Administered 2015-02-16: 09:00:00 via INTRAVENOUS

## 2015-02-16 MED ORDER — HYDROMORPHONE 1 MG/ML IV SOLN
INTRAVENOUS | Status: DC
  Administered 2015-02-16: 4.2 mg via INTRAVENOUS
  Administered 2015-02-16: 3.5 mg via INTRAVENOUS
  Administered 2015-02-16: 14.7 mg via INTRAVENOUS
  Administered 2015-02-16: 09:00:00 via INTRAVENOUS
  Filled 2015-02-16: qty 25

## 2015-02-16 MED ORDER — HYDROMORPHONE HCL 4 MG PO TABS
4.0000 mg | ORAL_TABLET | Freq: Once | ORAL | Status: AC
  Administered 2015-02-16: 4 mg via ORAL
  Filled 2015-02-16: qty 1

## 2015-02-16 MED ORDER — SODIUM CHLORIDE 0.9 % IJ SOLN: 10.0000 mL | INTRAMUSCULAR | Status: DC | PRN

## 2015-02-16 MED ORDER — ONDANSETRON HCL 4 MG/2ML IJ SOLN: 4.0000 mg | Freq: Four times a day (QID) | INTRAMUSCULAR | Status: DC | PRN

## 2015-02-16 NOTE — Progress Notes (Signed)
Encouraged cough and deep breathing

## 2015-02-16 NOTE — H&P (Signed)
[facility] History and Physical   Date: 02/16/2015  Patient name: [name] Medical record number: [id number] Date of birth: 03/18/1980 Age: 35 y.o. Gender: male PCP: [name], MD  Attending physician: Tresa Garter, MD  Chief Complaint: Bilateral Rib Pain  History of Present Illness: [name] is a 35 year old male with a history of sickle cell anemia,HbSS who presents to the day infusion center for pain related to sickle cell anemia. Patient was recently discharged from hospital 4 days ago. Pain is consistent with sickle cell anemia. He states that pain intensity started increasing on yesterday afternoon. Patient is on 3 liters of oxygen at home. He has not been wearing oxygen consistently. He maintains that he wears it at home but not while working or out in public. He did not come with his home oxygen today. [name] is also on chronic anticoagulation therapy due to history of pulmonary embolism. He states that he is taking medication consistently. Current pain intensity is described as 9/10, constant and throbbing primarily to to ribs bilaterally.  He last had Dilaudid 4 mg this morning around 6 am with minimal relief.  He denies any fever, chills, chest pain, shortness of breath, cough, abdominal pain, nausea, vomiting, or diarrhea.  Meds: Prescriptions prior to admission  Medication Sig Dispense Refill Last Dose  . aspirin 81 MG chewable tablet Chew 1 tablet (81 mg total) by mouth daily. 30 tablet 11 01/31/2015 at Unknown time  . Cholecalciferol (VITAMIN D) 2000 UNITS tablet Take 1 tablet (2,000 Units total) by mouth daily. 90 tablet 3 01/31/2015 at Unknown time  . enoxaparin (LOVENOX) 120 MG/0.8ML injection Inject 0.8 mLs (120 mg total) into the skin daily. 30 Syringe 2 01/31/2015 at Unknown time  . folic acid (FOLVITE) 1 MG tablet Take 1 tablet (1 mg total) by mouth every morning. 30 tablet 11 01/31/2015 at Unknown time  .  HYDROmorphone (DILAUDID) 4 MG tablet Take 1 tablet (4 mg total) by mouth every 4 (four) hours as needed for severe pain. 90 tablet 0 01/31/2015 at Unknown time  . hydroxyurea (HYDREA) 500 MG capsule Take 1 capsule (500 mg total) by mouth daily. May take with food to minimize GI side effects. 30 capsule 0 01/31/2015 at Unknown time  . lisinopril (PRINIVIL,ZESTRIL) 10 MG tablet Take 1 tablet (10 mg total) by mouth daily. 30 tablet 1 01/31/2015 at Unknown time  . metoprolol succinate (TOPROL-XL) 50 MG 24 hr tablet Take 1 tablet (50 mg total) by mouth daily. 30 tablet 1 01/31/2015 at 0930  . morphine (MS CONTIN) 30 MG 12 hr tablet Take 1 tablet (30 mg total) by mouth every 12 (twelve) hours. 60 tablet 0 01/31/2015 at Unknown time  . potassium chloride SA (K-DUR,KLOR-CON) 20 MEQ tablet Take 1 tablet (20 mEq total) by mouth every morning. 30 tablet 3 01/31/2015 at Unknown time  . zolpidem (AMBIEN) 10 MG tablet Take 1 tablet (10 mg total) by mouth at bedtime as needed for sleep. 30 tablet 0 01/30/2015 at Unknown time    Allergies: Review of patient's allergies indicates no known allergies. Past Medical History  Diagnosis Date  . Sickle cell anemia (HCC)   . Blood transfusion   . Acute embolism and thrombosis of right internal jugular vein ([place])   . Hypokalemia   . Mood disorder ([place])   . History of pulmonary embolus (PE)   . Avascular necrosis (HCC)     Right Hip  .  Leukocytosis     Chronic  . Thrombocytosis (HCC)     Chronic  . Hypertension   . History of Clostridium difficile infection   . Uses marijuana   . Chronic anticoagulation   . Functional asplenia   . Former smoker   . Second hand tobacco smoke exposure   . Alcohol consumption of one to four drinks per day   . Noncompliance with medication regimen   . Sickle-cell crisis with associated acute chest syndrome ([place]) 05/13/2013  . Acute chest syndrome ([place]) 06/18/2013  . Demand ischemia ([place]) 01/02/2014  . Pulmonary hypertension [facility])     Past Surgical History  Procedure Laterality Date  . Right hip replacement      08/2006  . Cholecystectomy      01/2008  . Porta cath placement    . Porta cath removal    . Umbilical hernia repair      01/2008  . Excision of left periauricular cyst      10/2009  . Excision of right ear lobe cyst with primary closur      11/2007  . Portacath placement  01/05/2012    Procedure: INSERTION PORT-A-CATH;  Surgeon: Odis Hollingshead, MD;  Location: [facility];  Service: General;  Laterality: N/A;  ultrasound guiced port a cath insertion with fluoroscopy   Family History  Problem Relation Age of Onset  . Sickle cell trait Mother   . Depression Mother   . Diabetes Mother   . Sickle cell trait Father   . Sickle cell trait Brother    Social History   Social History  . Marital Status: Single    Spouse Name: N/A  . Number of Children: 0  . Years of Education: 13   Occupational History  . 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 Smoking status: Former Smoker -- 13 years  . Smokeless tobacco: Never Used  . Alcohol Use: No  . Drug Use: 2.00 per week    Special: Marijuana     Comment: Marijuana weekly  . Sexual Activity:    Partners: Female    Museum/gallery curator: None     Comment: month ago   Other Topics Concern  . Not on file   Social History Narrative   Lives in an apartment.  Single.  Lives alone but has a girlfriend that helps care for him.  Does not use any assist devices.        Gerald [id number]  [phone] Mom, emergency contact      [facility]:   Patient continuing to live in her apartment in town alone. 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    Review of Systems: Constitutional: negative except for fatigue Eyes: positive for icterus Ears, nose, mouth, throat, and face: negative Respiratory: positive for dyspnea on exertion Cardiovascular: negative Gastrointestinal:  negative Genitourinary:negative Musculoskeletal:positive for arthralgias and back pain Neurological: negative Behavioral/Psych: negative Endocrine: negative Allergic/Immunologic: negative  Physical Exam: Blood pressure 119/69, pulse 96, temperature 98.4 F (36.9 C), temperature source Oral, resp. rate 18, SpO2 94 %.  General Appearance:    Alert, cooperative, mild distress, appears older than stated age  Head:    Normocephalic, without obvious abnormality, atraumatic  Eyes:    PERRL, conjunctiva/corneas clear, EOM's intact, scleral icterus bilaterally    Ears:    Normal TM's and external ear canals, both ears  Nose:   Nares normal, septum midline,  mucosa normal, no drainage    or sinus tenderness  Throat:   Lips, mucosa, and tongue normal; teeth and gums normal  Neck:   Supple, symmetrical, trachea midline, no adenopathy;       thyroid:  No enlargement/tenderness/nodules; no carotid   bruit or JVD  Back:     Symmetric, no curvature, ROM normal, no CVA tenderness  Lungs:     Clear to auscultation bilaterally, respirations unlabored.   Chest wall:    No tenderness or deformity. Left port a cath  Heart:    Irregular rate and regular rhythm  Abdomen:     Soft, non-tender, bowel sounds active all four quadrants,    no masses, no organomegaly  Extremities:   Extremities normal, atraumatic, Left anterior leg, trace edema with tenderness to palpation  Pulses:   2+ and symmetric all extremities  Skin:   Skin color, texture, turgor normal, no rashes or lesions  Lymph nodes:   Cervical, supraclavicular, and axillary nodes normal  Neurologic:   CNII-XII intact. Normal strength, sensation and reflexes      throughout    Lab results: No results found. However, due to the size of the patient record, not all encounters were searched. Please check Results Review for a complete set of results.  Imaging results:  No results found. BP 119/69 mmHg  Pulse 96  Temp(Src) 98.4 F (36.9 C) (Oral)   Resp 18  SpO2 94% Assessment & Plan:  Patient will be admitted to the day infusion center for extended observation  Start IV D5.45 for cellular rehydration at 100/hr  Start Toradol 30 mg IV times 1 for inflammation.  Start Dilaudid PCA High Concentration per weight based protocol.   Patient will be re-evaluated for pain intensity in the context of function and relationship to baseline as care progresses.   CBC w/differential, CMP, reticulocytes  Oxygen saturation is 93% on room air, will start oxygen 2 liters  Reviewed chest xray from 01/31/2015, no active cardiopulmonary disease and regional skeleton is unremarkable.  Dimitria Ketchum M 02/16/2015, 9:09 AM

## 2015-02-16 NOTE — Telephone Encounter (Signed)
Returned  Call to patient after speaking to medical provider and informed patient to come to the [facility] for treatment, he said he would arrive in 25 minutes.

## 2015-02-16 NOTE — Discharge Summary (Signed)
[facility] Discharge Summary   Patient ID: GIOVONNIE SEIGER MRN: [id number] DOB/AGE: Aug 01, 1980 35 y.o.  Admit date: 02/16/2015 Discharge date: 02/16/2015  Primary Care Physician:  [name], MD  Admission Diagnoses:  Active Problems:   Sickle cell crisis [facility])   Discharge Medications:    Medication List    ASK your doctor about these medications        aspirin 81 MG chewable tablet  Chew 1 tablet (81 mg total) by mouth daily.     enoxaparin 120 MG/0.8ML injection  Commonly known as:  LOVENOX  Inject 0.8 mLs (120 mg total) into the skin daily.     folic acid 1 MG tablet  Commonly known as:  FOLVITE  Take 1 tablet (1 mg total) by mouth every morning.     HYDROmorphone 4 MG tablet  Commonly known as:  DILAUDID  Take 1 tablet (4 mg total) by mouth every 4 (four) hours as needed for severe pain.     hydroxyurea 500 MG capsule  Commonly known as:  HYDREA  Take 1 capsule (500 mg total) by mouth daily. May take with food to minimize GI side effects.     lisinopril 10 MG tablet  Commonly known as:  PRINIVIL,ZESTRIL  Take 1 tablet (10 mg total) by mouth daily.     metoprolol succinate 50 MG 24 hr tablet  Commonly known as:  TOPROL-XL  Take 1 tablet (50 mg total) by mouth daily.     morphine 30 MG 12 hr tablet  Commonly known as:  MS CONTIN  Take 1 tablet (30 mg total) by mouth every 12 (twelve) hours.     potassium chloride SA 20 MEQ tablet  Commonly known as:  K-DUR,KLOR-CON  Take 1 tablet (20 mEq total) by mouth every morning.     Vitamin D 2000 units tablet  Take 1 tablet (2,000 Units total) by mouth daily.     zolpidem 10 MG tablet  Commonly known as:  AMBIEN  Take 1 tablet (10 mg total) by mouth at bedtime as needed for sleep.         Consults:  None  Significant Diagnostic Studies:  Dg Chest 2 View  01/31/2015  CLINICAL DATA:  Patient with sickle cell disease and acute chest pain. Left rib pain. EXAM: CHEST  2  VIEW COMPARISON:  Chest radiograph 01/26/2015. FINDINGS: Monitoring leads overlie the patient. Left anterior chest wall Port-A-Cath is present with tip projecting over the superior cavoatrial junction, stable. Stable cardiomegaly. Unchanged bilateral coarse interstitial pulmonary opacities. No new area of superimposed pulmonary consolidation. No pleural effusion or pneumothorax. Regional skeleton is unremarkable. Cholecystectomy clips. IMPRESSION: No active cardiopulmonary disease. Electronically Signed   By: Lovey Newcomer M.D.   On: 01/31/2015 18:37   Dg Chest 2 View  01/26/2015  CLINICAL DATA:  Acute onset of bilateral generalized chest pain. Initial encounter. EXAM: CHEST  2 VIEW COMPARISON:  Chest radiograph from 01/14/2015 FINDINGS: The lungs are well-aerated. Peribronchial thickening is noted. There is no evidence of focal opacification, pleural effusion or pneumothorax. The heart is enlarged. A left-sided chest port is noted ending about the distal SVC. No acute osseous abnormalities are seen. Clips are noted within the right upper quadrant, reflecting prior cholecystectomy. IMPRESSION: Peribronchial thickening noted. Lungs otherwise grossly clear. Cardiomegaly noted. Electronically Signed   By: Garald Balding M.D.   On: 01/26/2015 01:30    [facility] Course: -Mr. Asmar was admitted to  the day infusion center for extended observation.  -Started D5.45 for cellular re-hydration -Reviewed laboratory values, hemoglobin 5.9 patient is asymptomatic. May be due to hemodilution. Patient was discharged from the hospital on 02/13/2015. Patient has a follow up lab appointment scheduled for a CBC w/differntial.  -High Concentration PCA per weight based protocol for pain control. -Patient's oxygen saturation was 95% on 2 Liters of oxygen, patient to continue to wear oxygen as previously prescribed.    Patient used a total of 22.4 mg with 38 demands and 30 deliveries.  Patient was given  Dilaudid 4 mg per home medication regimen. He states that pain intensity is 4/10, will discharge home in stable condition on 2 liters of oxygen per previous home oxygen orders.   He maintains that he can function at home on current medication regimen. Patient is alert, oriented and ambulatory. He is encouraged to continue hydrating and consistent on medication regimen.    Physical Exam at Discharge:   BP 104/65 mmHg  Pulse 81  Temp(Src) 99 F (37.2 C) (Oral)  Resp 15  SpO2 93%   General Appearance:    Alert, cooperative, no distress, appears stated age  Eyes:    PERRL, conjunctiva/corneas clear, EOM's intact, fundi    benign, icterus bilaterally       Back:     Symmetric, no curvature, ROM normal, no CVA tenderness  Lungs:     Clear to auscultation bilaterally, respirations unlabored  Chest wall:    No tenderness or deformity  Heart:    Irregular rate and rhythm, S1 and S2 normal, no murmur, rub   or gallop  Lymph nodes:   Cervical, supraclavicular, and axillary nodes normal  Neurologic:   CNII-XII intact. Normal strength, sensation and reflexes      throughout     Disposition at Discharge: 01-Home or Self Care  Discharge Orders:   Condition at Discharge:   Stable  Time spent on Discharge:  15 minutes  Signed: Mutasim Tuckey M 02/16/2015, 4:03 PM

## 2015-02-16 NOTE — Progress Notes (Signed)
CRITICAL VALUE ALERT  Critical value received:  Hgb 5.9   Date of notification:  02/15/14  Time of notification:  1100  Critical value read back:Yes.    Nurse who received alert:  B. Tamala Julian  MD notified (1st page):  [place] Hollis, NP  Time of first page:  38  MD notified (2nd page):  Time of second page:  Responding MD:  [place] Hollis, NP  Time MD responded:  [phone]

## 2015-02-16 NOTE — Telephone Encounter (Signed)
Called unit complaining of pain in back and both sides, rating pain 8 out of 10. No fever, chest pain, nausea, vomiting or diarrhea, or priapism. Took Dilaudid at 6a without relief. Informed patient that medical provider would be notified and pt. given a call back. Patient voiced understanding.

## 2015-02-16 NOTE — Progress Notes (Signed)
Patient ID: [name], male   DOB: 19-Sep-1980, 34 y.o.   MRN: [id number] Patient ambulatory at discharge. Understands to come back in am for possible blood transfusion. Port flushed and de accessed per protocol. Patient pain level improved to 4/10 on pain scale. Pt. Encouraged to utilize home oxygen per home use order. Patient is in no acute distress.

## 2015-02-17 ENCOUNTER — Ambulatory Visit (HOSPITAL_COMMUNITY)
Admit: 2015-02-17 | Discharge: 2015-02-17 | Disposition: A | Discharge status: Home / Self Care | Payer: Medicare Other | Source: Ambulatory Visit | Attending: Internal Medicine | Admitting: Internal Medicine

## 2015-02-17 ENCOUNTER — Other Ambulatory Visit: Payer: Self-pay | Admitting: Family Medicine

## 2015-02-17 DIAGNOSIS — D5700 Hb-SS disease with crisis, unspecified: Secondary | ICD-10-CM | POA: Insufficient documentation

## 2015-02-17 LAB — HEMOGLOBIN AND HEMATOCRIT, BLOOD
HEMATOCRIT: 19.7 % — AB (ref 39.0–52.0)
HEMOGLOBIN: 6.4 g/dL — AB (ref 13.0–17.0)

## 2015-02-17 MED ORDER — HEPARIN SOD (PORK) LOCK FLUSH 100 UNIT/ML IV SOLN
500.0000 [IU] | INTRAVENOUS | Status: AC | PRN
  Administered 2015-02-17: 500 [IU]
  Filled 2015-02-17: qty 5

## 2015-02-17 MED ORDER — SODIUM CHLORIDE 0.9 % IJ SOLN
10.0000 mL | INTRAMUSCULAR | Status: AC | PRN
  Administered 2015-02-17: 10 mL

## 2015-02-17 NOTE — Progress Notes (Signed)
CRITICAL VALUE ALERT  Critical value received: Hgb 6.4  Date of notification: 02/17/2015  Time of notification:  [id number]  Critical value read back:yes  Nurse who received alert:  Roberto Scales, RN  Cammie [facility], FNP notified verbally

## 2015-02-17 NOTE — Progress Notes (Signed)
Pt received to the [facility] for blood draw for Hct and Hgb. Pt's porta cath was accessed and flushed per protocol. Pt's Hgb is 6.4 and Hct 19.7. Pt was discharged to home after checking with provider. Pt alert, oriented and ambulatory.

## 2015-02-19 ENCOUNTER — Emergency Department (HOSPITAL_COMMUNITY)
Admission: EM | Admit: 2015-02-19 | Discharge: 2015-02-19 | Disposition: A | Discharge status: Home / Self Care | Payer: Medicare Other | Attending: Emergency Medicine | Admitting: Emergency Medicine

## 2015-02-19 ENCOUNTER — Encounter (HOSPITAL_COMMUNITY): Payer: Self-pay | Admitting: Emergency Medicine

## 2015-02-19 DIAGNOSIS — Z87891 Personal history of nicotine dependence: Secondary | ICD-10-CM | POA: Insufficient documentation

## 2015-02-19 DIAGNOSIS — S8992XA Unspecified injury of left lower leg, initial encounter: Secondary | ICD-10-CM | POA: Insufficient documentation

## 2015-02-19 DIAGNOSIS — E876 Hypokalemia: Secondary | ICD-10-CM | POA: Diagnosis not present

## 2015-02-19 DIAGNOSIS — Q8901 Asplenia (congenital): Secondary | ICD-10-CM | POA: Diagnosis not present

## 2015-02-19 DIAGNOSIS — S8991XA Unspecified injury of right lower leg, initial encounter: Secondary | ICD-10-CM | POA: Diagnosis not present

## 2015-02-19 DIAGNOSIS — Y92410 Unspecified street and highway as the place of occurrence of the external cause: Secondary | ICD-10-CM | POA: Diagnosis not present

## 2015-02-19 DIAGNOSIS — Z862 Personal history of diseases of the blood and blood-forming organs and certain disorders involving the immune mechanism: Secondary | ICD-10-CM | POA: Diagnosis not present

## 2015-02-19 DIAGNOSIS — S161XXA Strain of muscle, fascia and tendon at neck level, initial encounter: Secondary | ICD-10-CM | POA: Diagnosis not present

## 2015-02-19 DIAGNOSIS — Y998 Other external cause status: Secondary | ICD-10-CM | POA: Diagnosis not present

## 2015-02-19 DIAGNOSIS — S199XXA Unspecified injury of neck, initial encounter: Secondary | ICD-10-CM | POA: Diagnosis present

## 2015-02-19 DIAGNOSIS — G8929 Other chronic pain: Secondary | ICD-10-CM | POA: Insufficient documentation

## 2015-02-19 DIAGNOSIS — Z7982 Long term (current) use of aspirin: Secondary | ICD-10-CM | POA: Diagnosis not present

## 2015-02-19 DIAGNOSIS — Z8619 Personal history of other infectious and parasitic diseases: Secondary | ICD-10-CM | POA: Diagnosis not present

## 2015-02-19 DIAGNOSIS — Z8739 Personal history of other diseases of the musculoskeletal system and connective tissue: Secondary | ICD-10-CM | POA: Diagnosis not present

## 2015-02-19 DIAGNOSIS — I10 Essential (primary) hypertension: Secondary | ICD-10-CM | POA: Insufficient documentation

## 2015-02-19 DIAGNOSIS — Z86718 Personal history of other venous thrombosis and embolism: Secondary | ICD-10-CM | POA: Insufficient documentation

## 2015-02-19 DIAGNOSIS — Y9389 Activity, other specified: Secondary | ICD-10-CM | POA: Insufficient documentation

## 2015-02-19 DIAGNOSIS — Z86711 Personal history of pulmonary embolism: Secondary | ICD-10-CM | POA: Insufficient documentation

## 2015-02-19 IMAGING — CR DG CHEST 2V
2 series · 2 of 2 positions shown · non-contrast
Comparison: 03/17/2012

CLINICAL DATA: Dyspnea on exertion

CHEST - 2 VIEW

[w chest pa]
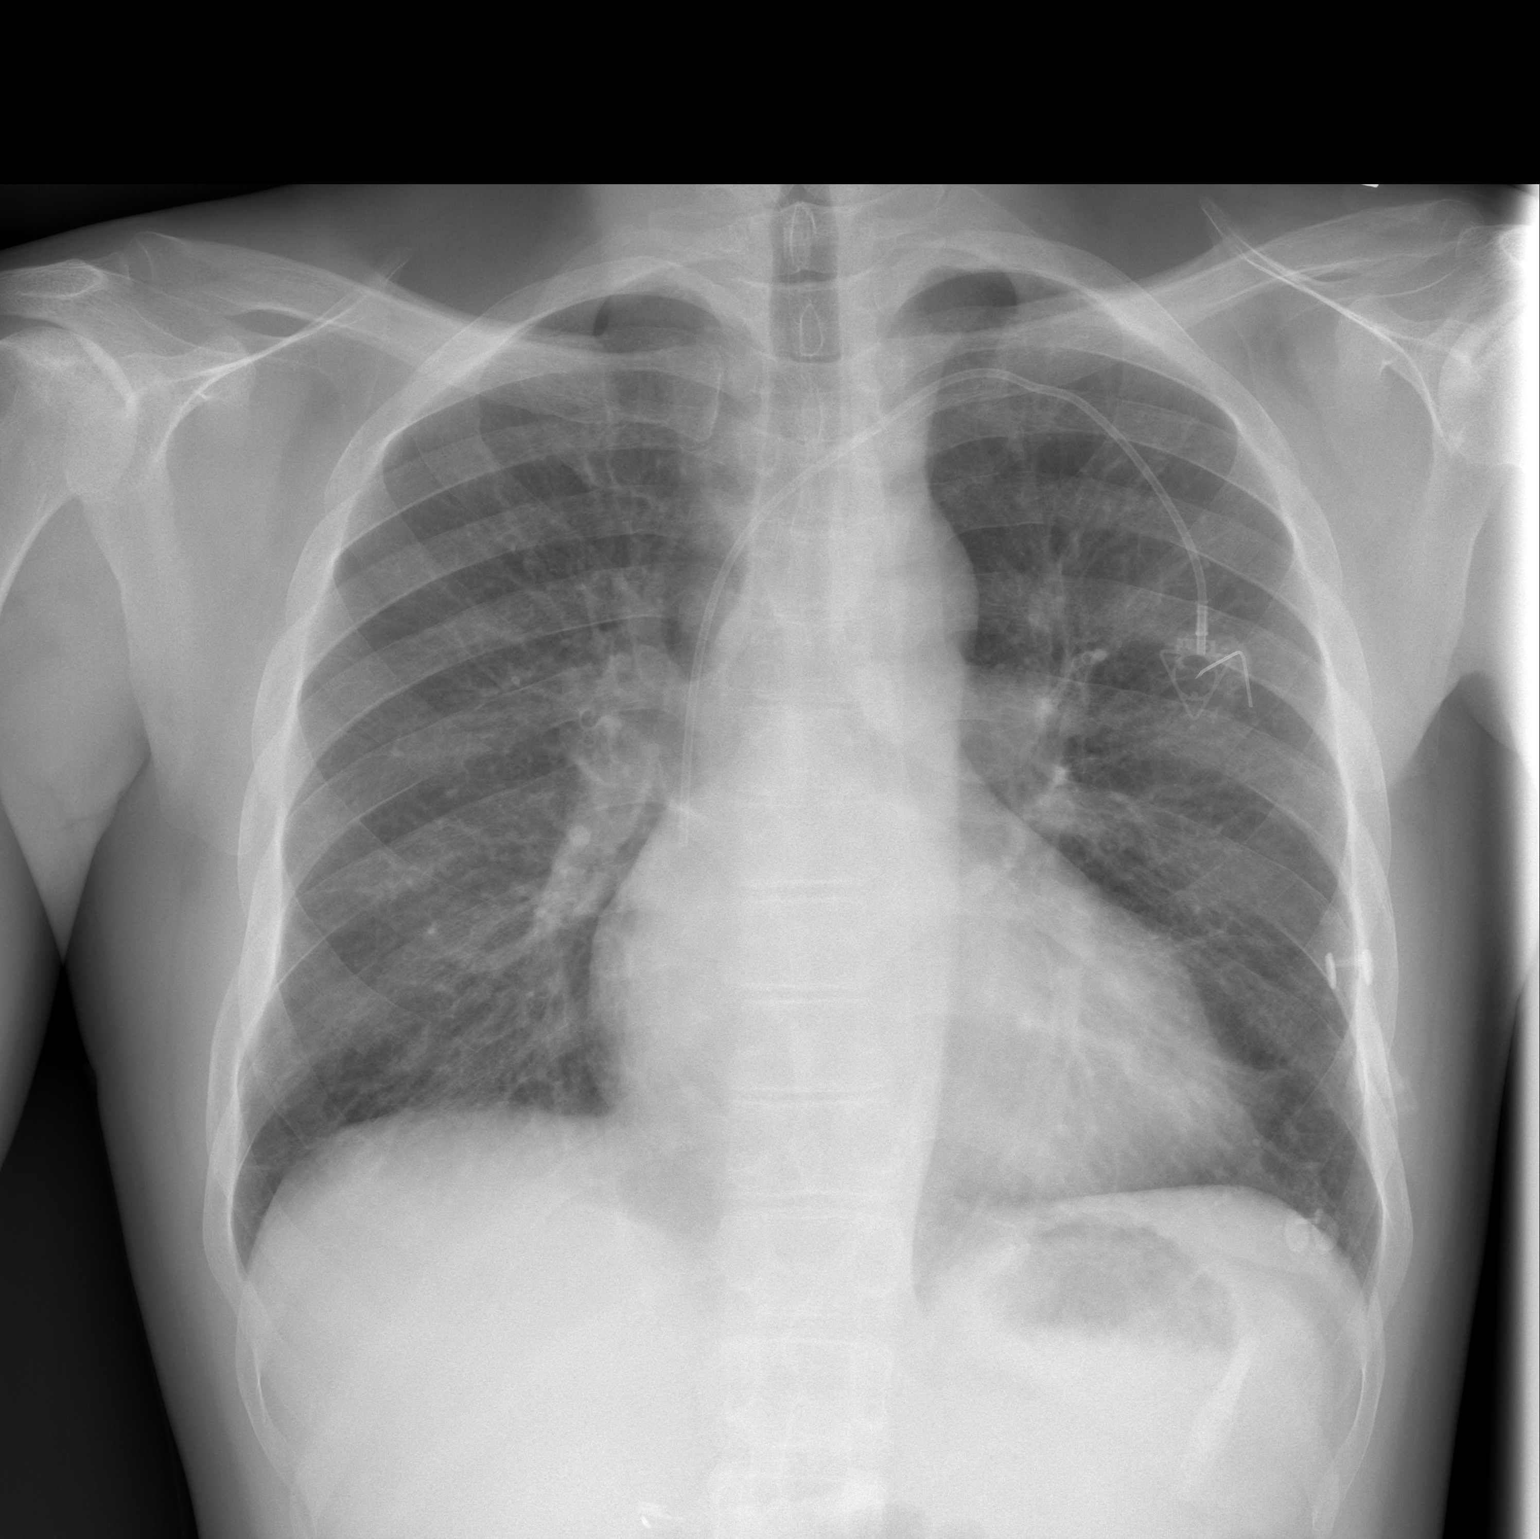

[w chest lat]
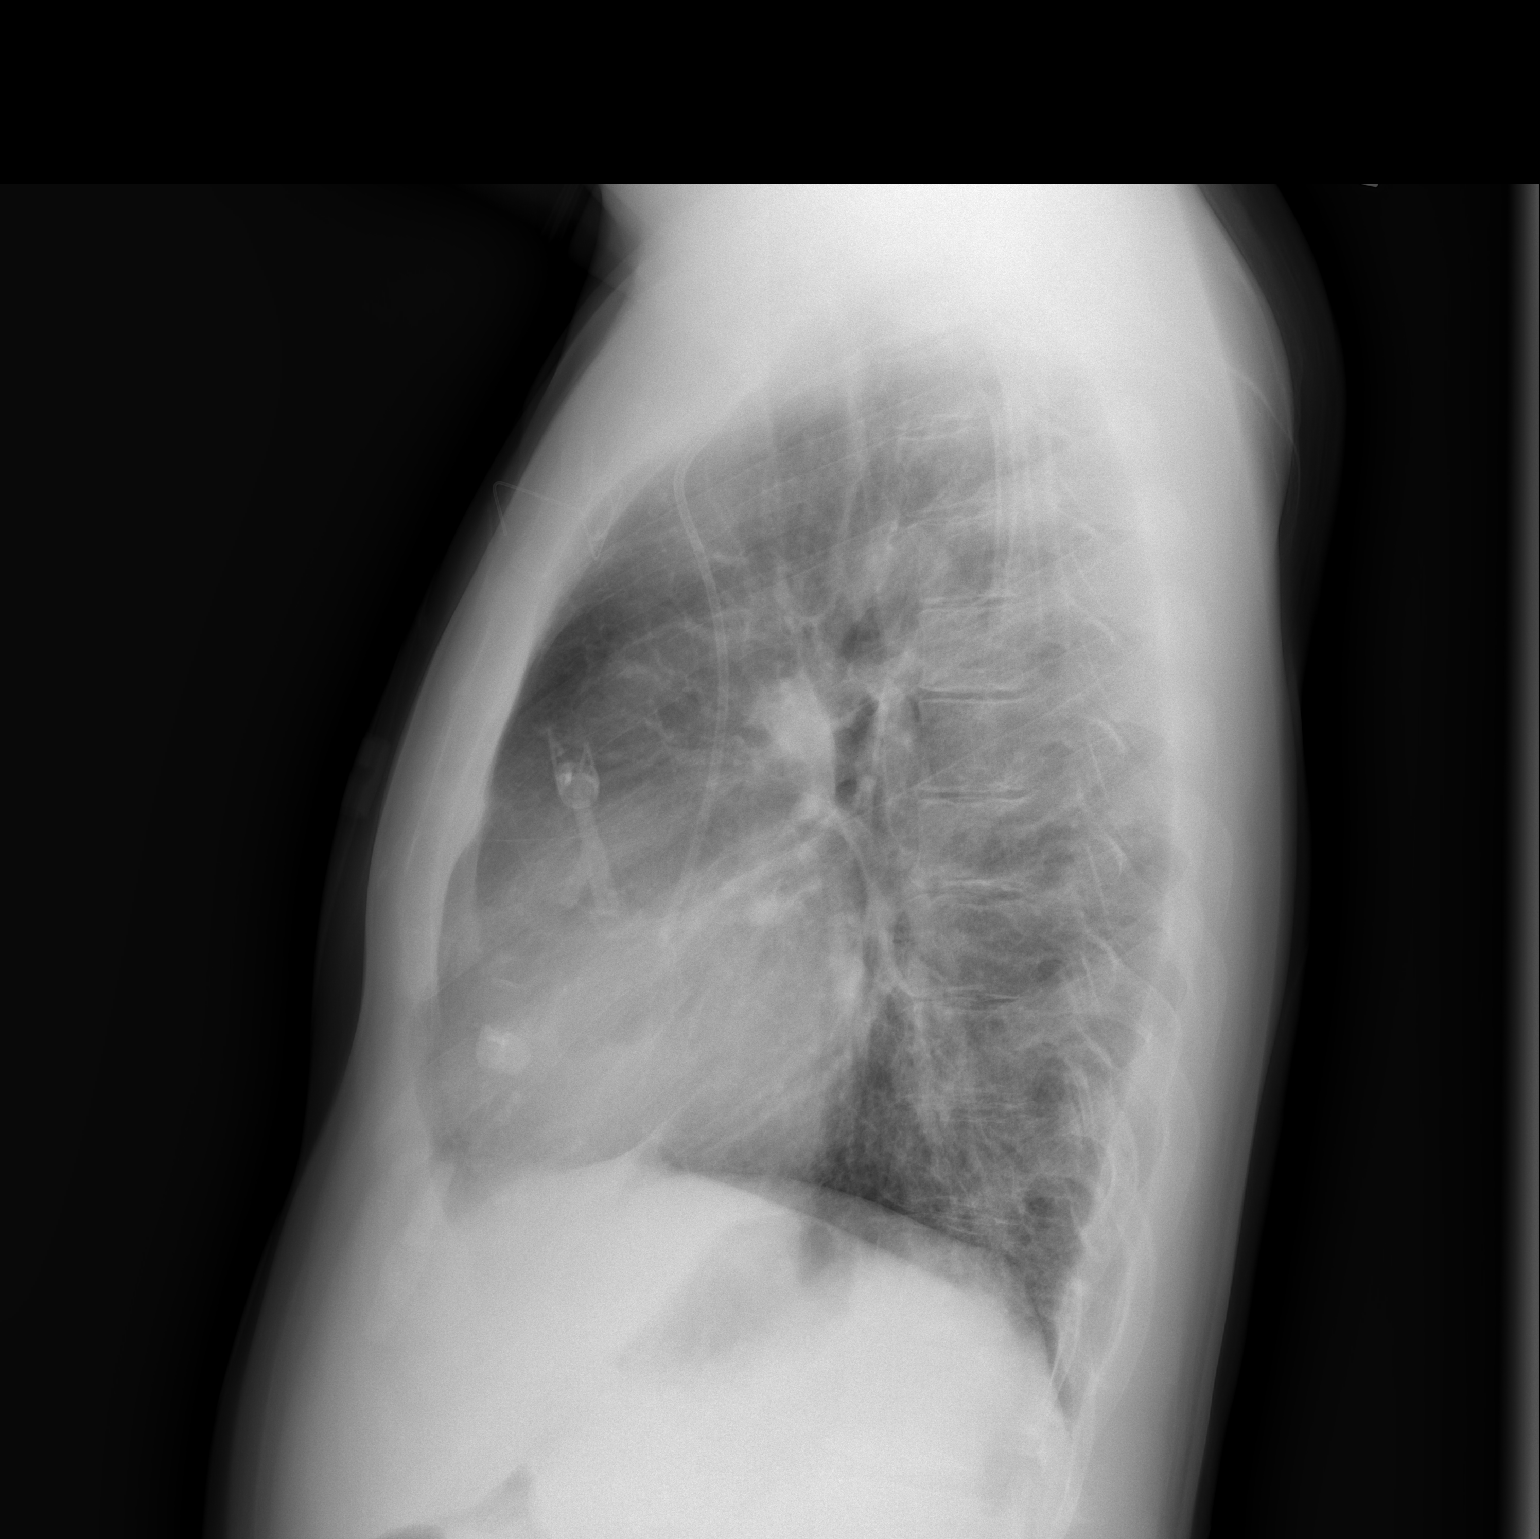

[2 of 2 positions shown; findings below may reference images not displayed]

FINDINGS: Left chest wall porta-catheter is noted with tip in the
projection of the cavoatrial junction.  There is mild cardiac
enlargement.  There is no pleural effusion or edema.  No airspace
consolidation noted.
IMPRESSION: 1.  No acute cardiopulmonary abnormalities.

## 2015-02-19 MED ORDER — CYCLOBENZAPRINE HCL 5 MG PO TABS: 5.0000 mg | ORAL_TABLET | Freq: Three times a day (TID) | ORAL | Status: DC | PRN

## 2015-02-19 NOTE — ED Notes (Signed)
Bed: WA27 Expected date:  Expected time:  Means of arrival:  Comments: 
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
## 2015-02-19 NOTE — Discharge Summary (Signed)
[name] MRN: [id number] DOB/AGE: 1980-04-23 35 y.o.  Admit date: 01/08/2015 Discharge date: 01/10/2015  Primary Care Physician:  [name], MD   Discharge Diagnoses:   Patient Active Problem List   Diagnosis Date Noted  . Cor pulmonale, chronic ([place]) 08/25/2014    Priority: High  . Pulmonary HTN ([place]) 06/18/2013    Priority: High  . Functional asplenia     Priority: High  . Anemia of chronic disease 06/25/2014    Priority: Medium  . Chronic respiratory failure with hypoxia ([place]) 03/14/2014    Priority: Medium  . Hb-SS disease with crisis () 01/22/2014    Priority: Medium  . Chronic anticoagulation 08/22/2013    Priority: Medium  . Avascular necrosis ([place])     Priority: Low  . Anemia 01/31/2015  . Symptomatic anemia   . Syncope 11/30/2014  . Chest pain   . Hb-SS disease without crisis ([place]) 10/07/2014  . Acute respiratory failure with hypoxia ([place])   . Acute chest syndrome in sickle crisis ([place])   . PVC's (premature ventricular contractions) 09/10/2014  . Abnormal EKG 09/10/2014  . Hypoxemia   . Chronic atrial fibrillation ([place]) 09/09/2014  . Sickle cell crisis ([place]) 09/08/2014  . Bacteremia   . Elevated troponin I level 08/29/2014  . Ankle edema 07/07/2014  . Sickle cell anemia ([place]) 06/25/2014  . PAH (pulmonary artery hypertension) ([place]) 03/18/2014  . Paralytic strabismus, external ophthalmoplegia   . Chronic pain syndrome 12/12/2013  . Essential hypertension 08/22/2013  . Vitamin D deficiency 02/13/2013  . Embolism, pulmonary with infarction ([place]) 07/09/2012  . Hx of pulmonary embolus 06/29/2012  . Hemochromatosis 12/14/2011    DISCHARGE MEDICATION: Pt left AMA.    BRIEF ADMITTING H & P: [name] is a 35 y.o. male with history of chronic respiratory failure on home oxygen, PE on Lovenox and sickle cell disease presents to the ER because of pain around the both ribs and low back which patient states is typical of his sickle  cell pain crisis. Denies any fever chills or productive cough. In the ER chest x-ray shows some congestion. Patient has been admitted for sickle cell pain crisis. On exam patient is appearing nonfocal denies any headache or visual symptoms   Hospital Course:  Present on Admission:  . Sickle cell crisis ([place]): Pt was managed with Dilaudid via PCA. As his pain improved he was transitioned to oral analgesics. However patient was still utilizing a substantial amount of IV Opiates and was being weaned off the PCA. However patient left AMA before being further evaluated.   . Cor pulmonale, chronic ([place]): Pt was on 3 l/min of Oxygen. He should continue ATC.  Disposition and Follow-up: Pt left AMA    DISCHARGE EXAM:  No examination performed at the time of discharge. Pt left AMA.   Total time spent including face to face and decision making was less than 30 minutes  Signed: [name],[name] 02/19/2015, 11:00 AM

## 2015-02-19 NOTE — Discharge Instructions (Signed)
Return to the ED with any concerns including difficulty breathing, weakness of arms or legs, abdominal pain, vomiting, decreased level of alertness/lethargy, or any other alarming symptoms

## 2015-02-20 LAB — TYPE AND SCREEN
ABO/RH(D): O POS
Antibody Screen: NEGATIVE
UNIT DIVISION: 0

## 2015-02-23 ENCOUNTER — Ambulatory Visit (INDEPENDENT_AMBULATORY_CARE_PROVIDER_SITE_OTHER): Payer: Medicare Other | Admitting: Family Medicine

## 2015-02-23 ENCOUNTER — Encounter: Payer: Self-pay | Admitting: Family Medicine

## 2015-02-23 VITALS — BP 116/80 | HR 87 | Temp 98.3°F | Resp 16 | Ht 72.0 in | Wt 167.0 lb

## 2015-02-23 DIAGNOSIS — I482 Chronic atrial fibrillation, unspecified: Secondary | ICD-10-CM

## 2015-02-23 DIAGNOSIS — I272 Other secondary pulmonary hypertension: Secondary | ICD-10-CM

## 2015-02-23 DIAGNOSIS — Z86711 Personal history of pulmonary embolism: Secondary | ICD-10-CM | POA: Diagnosis not present

## 2015-02-23 DIAGNOSIS — D5700 Hb-SS disease with crisis, unspecified: Secondary | ICD-10-CM | POA: Diagnosis not present

## 2015-02-23 DIAGNOSIS — Z9981 Dependence on supplemental oxygen: Secondary | ICD-10-CM

## 2015-02-23 DIAGNOSIS — E559 Vitamin D deficiency, unspecified: Secondary | ICD-10-CM | POA: Diagnosis not present

## 2015-02-23 DIAGNOSIS — D571 Sickle-cell disease without crisis: Secondary | ICD-10-CM

## 2015-02-23 LAB — POCT URINALYSIS DIP (DEVICE)
Bilirubin Urine: NEGATIVE
GLUCOSE, UA: NEGATIVE mg/dL
Ketones, ur: NEGATIVE mg/dL
LEUKOCYTES UA: NEGATIVE
NITRITE: NEGATIVE
Protein, ur: NEGATIVE mg/dL
Specific Gravity, Urine: 1.015 (ref 1.005–1.030)
UROBILINOGEN UA: 0.2 mg/dL (ref 0.0–1.0)
pH: 7 (ref 5.0–8.0)

## 2015-02-23 MED ORDER — FOLIC ACID 1 MG PO TABS: 1.0000 mg | ORAL_TABLET | Freq: Every morning | ORAL | Status: DC

## 2015-02-23 MED ORDER — VITAMIN D 50 MCG (2000 UT) PO TABS: 2000.0000 [IU] | ORAL_TABLET | Freq: Every day | ORAL | Status: DC

## 2015-02-23 MED ORDER — POTASSIUM CHLORIDE CRYS ER 20 MEQ PO TBCR: 20.0000 meq | EXTENDED_RELEASE_TABLET | Freq: Every morning | ORAL | Status: DC

## 2015-02-23 MED ORDER — METOPROLOL SUCCINATE ER 50 MG PO TB24: 50.0000 mg | ORAL_TABLET | Freq: Every day | ORAL | Status: DC

## 2015-02-23 NOTE — Patient Instructions (Addendum)
Continue medications as previously prescribed.  Continue home oxygen therapySickle Cell Anemia, Adult Sickle cell anemia is a condition in which red blood cells have an abnormal "sickle" shape. This abnormal shape shortens the cells' life span, which results in a lower than normal concentration of red blood cells in the blood. The sickle shape also causes the cells to clump together and block free blood flow through the blood vessels. As a result, the tissues and organs of the body do not receive enough oxygen. Sickle cell anemia causes organ damage and pain and increases the risk of infection. CAUSES  Sickle cell anemia is a genetic disorder. Those who receive two copies of the gene have the condition, and those who receive one copy have the trait. RISK FACTORS The sickle cell gene is most common in people whose families originated in [place]. Other areas of the globe where sickle cell trait occurs include the [place], [place] and [place], and the [place].  SIGNS AND SYMPTOMS  Pain, especially in the extremities, back, chest, or abdomen (common). The pain may start suddenly or may develop following an illness, especially if there is dehydration. Pain can also occur due to overexertion or exposure to extreme temperature changes.  Frequent severe bacterial infections, especially certain types of pneumonia and meningitis.  Pain and swelling in the hands and feet.  Decreased activity.   Loss of appetite.   Change in behavior.  Headaches.  Seizures.  Shortness of breath or difficulty breathing.  Vision changes.  Skin ulcers. Those with the trait may not have symptoms or they may have mild symptoms.  DIAGNOSIS  Sickle cell anemia is diagnosed with blood tests that demonstrate the genetic trait. It is often diagnosed during the newborn period, due to mandatory testing nationwide. A variety of blood tests, X-rays, CT scans, MRI scans, ultrasounds, and lung  function tests may also be done to monitor the condition. TREATMENT  Sickle cell anemia may be treated with:  Medicines. You may be given pain medicines, antibiotic medicines (to treat and prevent infections) or medicines to increase the production of certain types of hemoglobin.  Fluids.  Oxygen.  Blood transfusions. HOME CARE INSTRUCTIONS   Drink enough fluid to keep your urine clear or pale yellow. Increase your fluid intake in hot weather and during exercise.  Do not smoke. Smoking lowers oxygen levels in the blood.   Only take over-the-counter or prescription medicines for pain, fever, or discomfort as directed by your health care provider.  Take antibiotics as directed by your health care provider. Make sure you finish them it even if you start to feel better.   Take supplements as directed by your health care provider.   Consider wearing a medical alert bracelet. This tells anyone caring for you in an emergency of your condition.   When traveling, keep your medical information, health care provider's names, and the medicines you take with you at all times.   If you develop a fever, do not take medicines to reduce the fever right away. This could cover up a problem that is developing. Notify your health care provider.  Keep all follow-up appointments with your health care provider. Sickle cell anemia requires regular medical care. SEEK MEDICAL CARE IF: You have a fever. SEEK IMMEDIATE MEDICAL CARE IF:   You feel dizzy or faint.   You have new abdominal pain, especially on the left side near the stomach area.   You develop a persistent, often uncomfortable and painful penile  erection (priapism). If this is not treated immediately it will lead to impotence.   You have numbness your arms or legs or you have a hard time moving them.   You have a hard time with speech.   You have a fever or persistent symptoms for more than 2-3 days.   You have a fever and  your symptoms suddenly get worse.   You have signs or symptoms of infection. These include:   Chills.   Abnormal tiredness (lethargy).   Irritability.   Poor eating.   Vomiting.   You develop pain that is not helped with medicine.   You develop shortness of breath.  You have pain in your chest.   You are coughing up pus-like or bloody sputum.   You develop a stiff neck.  Your feet or hands swell or have pain.  Your abdomen appears bloated.  You develop joint pain. MAKE SURE YOU:  Understand these instructions.   This information is not intended to replace advice given to you by your health care provider. Make sure you discuss any questions you have with your health care provider.   Document Released: 04/27/2005 Document Revised: 02/07/2014 Document Reviewed: 08/29/2012 Elsevier Interactive Patient Education [employer].

## 2015-02-23 NOTE — Progress Notes (Signed)
Subjective:    Patient ID: KHADIM HAYEN, male    DOB: [birth year], 35 y.o.   MRN: [id number]  HPI  Mr. Nickolaos Bogard, a 35 year old male with a history of sickle cell anemia, HbSS presents accompanied by girlfriend  for a 1 month follow-up  and medication management.   Patient has also on chronic anticoagulation therapy due to history of pulmonary embolism.He maintains that he is using Lovenox consistently. Mr. Aguiar reports that he has 5/10 pain primarily to lower extremities that is consistent with sickle cell anemia.  Patient has been having increased pain over the past several weeks. Mr. Coriell also says that he has been taking hydroxyurea every evening.  Patient is currently not followed by hematologist. He reports that he last had Dilaudid 4 mg around 7 am with moderate relief.  Mr. Towers denies headache, shortness of breath, chest pains, nausea, vomiting, and diarrhea. Mr. Fielding has a history of pulmonary hypertension. He was also restarted on Metoprolol several months ago and has been taking consistently.   Mr. Mcbreen also has a history of hypoxemia. He is currently on home oxygen therapy. Patient reports that he was issued oxygen tank by [facility]. He states that he will only use oxygen at home. He is not carrying an oxygen tank around because it is larger than he is willing to carry around. Immunization History  Administered Date(s) Administered  . Influenza Split 01/15/2012  . Influenza,inj,Quad PF,36+ Mos 11/14/2012, 10/28/2013, 10/27/2014  . Pneumococcal Conjugate-13 07/07/2014  . Pneumococcal Polysaccharide-23 01/15/2012  . Tdap 11/21/2012   Past Medical History  Diagnosis Date  . Sickle cell anemia (HCC)   . Blood transfusion   . Acute embolism and thrombosis of right internal jugular vein ([name])   . Hypokalemia   . Mood disorder ([place])   . History of pulmonary embolus (PE)   . Avascular necrosis (HCC)     Right Hip  . Leukocytosis     Chronic  .  Thrombocytosis (HCC)     Chronic  . Hypertension   . History of Clostridium difficile infection   . Uses marijuana   . Chronic anticoagulation   . Functional asplenia   . Former smoker   . Second hand tobacco smoke exposure   . Alcohol consumption of one to four drinks per day   . Noncompliance with medication regimen   . Sickle-cell crisis with associated acute chest syndrome ([place]) 05/13/2013  . Acute chest syndrome ([place]) 06/18/2013  . Demand ischemia ([place]) 01/02/2014  . Pulmonary hypertension ([place])    Social History   Social History  . Marital Status: Single    Spouse Name: N/A  . Number of Children: 0  . Years of Education: 13   Occupational History  . 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 Smoking status: Former Smoker -- 13 years  . Smokeless tobacco: Never Used  . Alcohol Use: No  . Drug Use: 2.00 per week    Special: Marijuana     Comment: Marijuana weekly  . Sexual Activity:    Partners: Female    Museum/gallery curator: None     Comment: month ago   Other Topics Concern  . Not on file   Social History Narrative   Lives in an apartment.  Single.  Lives alone but has a girlfriend that helps care for him.  Does not use any assist devices.  [name]  [phone] Mom, emergency contact      [facility]:   Patient continuing to live in her apartment in town alone. 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  No Known Allergies Review of Systems  Constitutional: Negative for fever, fatigue and unexpected weight change.  HENT: Negative.   Eyes: Negative.   Respiratory: Negative.   Gastrointestinal: Negative.  Negative for nausea and diarrhea.  Endocrine: Negative.  Negative for polydipsia, polyphagia and polyuria.  Genitourinary: Negative.   Musculoskeletal: Positive for myalgias (lower extremities).  Skin: Negative.   Allergic/Immunologic: Negative.   Neurological: Negative.  Negative  for dizziness.  Hematological: Negative.   Psychiatric/Behavioral: Negative.       Objective:   Physical Exam  Constitutional: He is oriented to person, place, and time. He appears well-nourished.  HENT:  Head: Normocephalic and atraumatic.  Right Ear: External ear normal.  Left Ear: External ear normal.  Mouth/Throat: Oropharynx is clear and moist.  Eyes: Conjunctivae and EOM are normal. Pupils are equal, round, and reactive to light.  Neck: Normal range of motion. Neck supple.  Cardiovascular: Intact distal pulses and normal pulses.  An irregularly irregular rhythm present.  Pulmonary/Chest: Effort normal and breath sounds normal.  Abdominal: Soft. Bowel sounds are normal.  Musculoskeletal: Normal range of motion.  Neurological: He is alert and oriented to person, place, and time. He has normal reflexes.  Skin: Skin is warm and dry.  Psychiatric: He has a normal mood and affect. His behavior is normal. Judgment and thought content normal.      BP 116/80 mmHg  Pulse 87  Temp(Src) 98.3 F (36.8 C) (Oral)  Resp 16  Ht 6' (1.829 m)  Wt 167 lb (75.751 kg)  BMI 22.64 kg/m2 Assessment & Plan:  1. Hb-SS disease with crisis Patient currently on 1500 mg of hydrea.  Reviewed most recent CBC w/ differential. ANC >2, platelet count > 80K, and hemoglobin is >6.  Reviewed recent CBC w/differential and reticulocyte. Will not increase hydrea at this point. Patient did not follow up with hematologist as scheduled. We discussed the need for good hydration, monitoring of hydration status, avoidance of heat, cold, stress, and infection triggers. We discussed the risks and benefits of Hydrea, including bone marrow suppression, the possibility of GI upset, skin ulcers, hair thinning, and teratogenicity. The patient was reminded of the need to seek medical attention of any symptoms of bleeding, anemia, or infection. Continue folic acid 1 mg daily to prevent aplastic bone marrow crises.    Pulmonary  evaluation -  Patient was discharged with 3 liters of oxygen during previous hospital visit. He states that he will not wear oxygen in public. Patient denies severe recurrent wheezes, shortness of breath with exercise, or persistent cough. Oxygen saturation is 95% without oxygen.   Cardiac - Abnormal EKG and echocardiogram. Patient has a history of pulmonary hypertension. He has been referred to cardiology on several visits.    Acute and chronic painful episodes - We agreed on continuing MS contin 30 mg every 12 hours.  Will also continue Dilaudid 4 mg every 4 hours as needed for moderate to severe pain. Patient is currently at 156 milli equivalents per day.  We discussed that pt is to receive her Schedule II prescriptions only from [place]. Pt is also aware that the prescription history is available to [place] online through the [facility] CSRS. Controlled substance agreement signed previously. We reminded Mr. Downie  that all patients receiving Schedule II narcotics must be  seen for follow within one month of prescription being requested. We reviewed the terms of our pain agreement, including the need to keep medicines in a safe locked location away from children or pets, and the need to report excess sedation or constipation, measures to avoid constipation, and policies related to early refills and stolen prescriptions. According to the [place] [facility] program, we have reviewed details related to analgesia, adverse effects, aberrant behaviors. Reviewed [place] Substance Reporting system prior to reorder, no inconsistencies noted.  - folic acid (FOLVITE) 1 MG tablet; Take 1 tablet (1 mg total) by mouth every morning.  Dispense: 30 tablet; Refill: 11 - POCT urinalysis dipstick   2. Hx of pulmonary embolus Patient was started on 120 units of Lovenox daily during hospital stay. He maintains that he has been using Lovenox consistently. Discussed side effects; specifically signs of bleeding. He expressed understanding.    3. Chronic anticoagulation Refer to #2  4. Pulmonary HTN ([place]) Patient to continue Metoprolol 50 mg XL as scheduled. Patient has had referrals to both pulmonology and cardiology. He states that appointments were made on Fridays and he was  able to follow-up. Suggested that patient re-schedule appointment with cardiology.  - potassium chloride SA (K-DUR,KLOR-CON) 20 MEQ tablet; Take 1 tablet (20 mEq total) by mouth every morning.  Dispense: 30 tablet; Refill: 3 - metoprolol succinate (TOPROL-XL) 50 MG 24 hr tablet; Take 1 tablet (50 mg total) by mouth daily.  Dispense: 30 tablet; Refill: 3  5. Vitamin D deficiency  - Cholecalciferol (VITAMIN D) 2000 units tablet; Take 1 tablet (2,000 Units total) by mouth daily.  Dispense: 90 tablet; Refill: 3  6. On home oxygen therapy  Will continue on 3L of oxygen as previously prescribed.   RTC: 1 month for medication management The patient was given clear instructions to go to ER or return to medical center if symptoms do not improve, worsen or new problems develop. The patient verbalized understanding. Will notify patient with laboratory results. Dorena Dew, FNP

## 2015-02-25 ENCOUNTER — Encounter (HOSPITAL_COMMUNITY): Payer: Self-pay

## 2015-02-25 ENCOUNTER — Inpatient Hospital Stay (HOSPITAL_COMMUNITY)
Admission: EM | Admit: 2015-02-25 | Discharge: 2015-02-27 | DRG: 811 | Disposition: A | Discharge status: Home / Self Care | Payer: Medicare Other | Attending: Internal Medicine | Admitting: Internal Medicine

## 2015-02-25 ENCOUNTER — Telehealth (HOSPITAL_COMMUNITY): Payer: Self-pay | Admitting: Hematology

## 2015-02-25 ENCOUNTER — Emergency Department (HOSPITAL_COMMUNITY): Payer: Medicare Other

## 2015-02-25 DIAGNOSIS — Z7902 Long term (current) use of antithrombotics/antiplatelets: Secondary | ICD-10-CM

## 2015-02-25 DIAGNOSIS — Z9981 Dependence on supplemental oxygen: Secondary | ICD-10-CM

## 2015-02-25 DIAGNOSIS — Y95 Nosocomial condition: Secondary | ICD-10-CM | POA: Diagnosis present

## 2015-02-25 DIAGNOSIS — J9621 Acute and chronic respiratory failure with hypoxia: Secondary | ICD-10-CM | POA: Diagnosis present

## 2015-02-25 DIAGNOSIS — I10 Essential (primary) hypertension: Secondary | ICD-10-CM | POA: Diagnosis present

## 2015-02-25 DIAGNOSIS — I272 Other secondary pulmonary hypertension: Secondary | ICD-10-CM | POA: Diagnosis present

## 2015-02-25 DIAGNOSIS — Z86711 Personal history of pulmonary embolism: Secondary | ICD-10-CM | POA: Diagnosis not present

## 2015-02-25 DIAGNOSIS — R51 Headache: Secondary | ICD-10-CM | POA: Diagnosis present

## 2015-02-25 DIAGNOSIS — D72829 Elevated white blood cell count, unspecified: Secondary | ICD-10-CM | POA: Diagnosis not present

## 2015-02-25 DIAGNOSIS — Z86718 Personal history of other venous thrombosis and embolism: Secondary | ICD-10-CM | POA: Diagnosis not present

## 2015-02-25 DIAGNOSIS — Z9119 Patient's noncompliance with other medical treatment and regimen: Secondary | ICD-10-CM | POA: Diagnosis not present

## 2015-02-25 DIAGNOSIS — D5700 Hb-SS disease with crisis, unspecified: Principal | ICD-10-CM | POA: Diagnosis present

## 2015-02-25 DIAGNOSIS — I2781 Cor pulmonale (chronic): Secondary | ICD-10-CM | POA: Diagnosis not present

## 2015-02-25 DIAGNOSIS — J9611 Chronic respiratory failure with hypoxia: Secondary | ICD-10-CM

## 2015-02-25 DIAGNOSIS — R0781 Pleurodynia: Secondary | ICD-10-CM | POA: Diagnosis not present

## 2015-02-25 DIAGNOSIS — Z7901 Long term (current) use of anticoagulants: Secondary | ICD-10-CM | POA: Diagnosis not present

## 2015-02-25 DIAGNOSIS — F1290 Cannabis use, unspecified, uncomplicated: Secondary | ICD-10-CM | POA: Diagnosis present

## 2015-02-25 DIAGNOSIS — Z87891 Personal history of nicotine dependence: Secondary | ICD-10-CM | POA: Diagnosis not present

## 2015-02-25 DIAGNOSIS — G8929 Other chronic pain: Secondary | ICD-10-CM | POA: Diagnosis not present

## 2015-02-25 DIAGNOSIS — J189 Pneumonia, unspecified organism: Secondary | ICD-10-CM

## 2015-02-25 DIAGNOSIS — D638 Anemia in other chronic diseases classified elsewhere: Secondary | ICD-10-CM | POA: Diagnosis present

## 2015-02-25 DIAGNOSIS — I2721 Secondary pulmonary arterial hypertension: Secondary | ICD-10-CM

## 2015-02-25 LAB — I-STAT CG4 LACTIC ACID, ED: LACTIC ACID, VENOUS: 1.22 mmol/L (ref 0.5–2.0)

## 2015-02-25 LAB — CBC WITH DIFFERENTIAL/PLATELET
BASOS PCT: 1 %
Basophils Absolute: 0.2 10*3/uL — ABNORMAL HIGH (ref 0.0–0.1)
EOS PCT: 3 %
Eosinophils Absolute: 0.7 10*3/uL (ref 0.0–0.7)
HEMATOCRIT: 20.5 % — AB (ref 39.0–52.0)
HEMOGLOBIN: 6.9 g/dL — AB (ref 13.0–17.0)
LYMPHS ABS: 2.8 10*3/uL (ref 0.7–4.0)
Lymphocytes Relative: 13 %
MCH: 30.7 pg (ref 26.0–34.0)
MCHC: 33.7 g/dL (ref 30.0–36.0)
MCV: 91.1 fL (ref 78.0–100.0)
MONO ABS: 3.3 10*3/uL — AB (ref 0.1–1.0)
Monocytes Relative: 15 %
NEUTROS ABS: 14.8 10*3/uL — AB (ref 1.7–7.7)
Neutrophils Relative %: 68 %
Platelets: 511 10*3/uL — ABNORMAL HIGH (ref 150–400)
RBC: 2.25 MIL/uL — ABNORMAL LOW (ref 4.22–5.81)
RDW: 22 % — AB (ref 11.5–15.5)
WBC: 21.8 10*3/uL — AB (ref 4.0–10.5)

## 2015-02-25 LAB — URINALYSIS, ROUTINE W REFLEX MICROSCOPIC
Bilirubin Urine: NEGATIVE
GLUCOSE, UA: NEGATIVE mg/dL
Hgb urine dipstick: NEGATIVE
Ketones, ur: NEGATIVE mg/dL
LEUKOCYTES UA: NEGATIVE
NITRITE: NEGATIVE
PH: 6.5 (ref 5.0–8.0)
Protein, ur: NEGATIVE mg/dL
Specific Gravity, Urine: 1.013 (ref 1.005–1.030)

## 2015-02-25 LAB — BASIC METABOLIC PANEL
Anion gap: 6 (ref 5–15)
BUN: 9 mg/dL (ref 6–20)
CHLORIDE: 113 mmol/L — AB (ref 101–111)
CO2: 23 mmol/L (ref 22–32)
CREATININE: 0.72 mg/dL (ref 0.61–1.24)
Calcium: 8.8 mg/dL — ABNORMAL LOW (ref 8.9–10.3)
GFR calc Af Amer: >60 mL/min (ref >60)
GFR calc non Af Amer: >60 mL/min (ref >60)
GLUCOSE: 113 mg/dL — AB (ref 65–99)
Potassium: 4.2 mmol/L (ref 3.5–5.1)
SODIUM: 142 mmol/L (ref 135–145)

## 2015-02-25 LAB — RETICULOCYTES
RBC.: 2.25 MIL/uL — ABNORMAL LOW (ref 4.22–5.81)
Retic Ct Pct: >23 % — ABNORMAL HIGH (ref 0.4–3.1)

## 2015-02-25 LAB — LACTATE DEHYDROGENASE: LDH: 351 U/L — ABNORMAL HIGH (ref 98–192)

## 2015-02-25 MED ORDER — SODIUM CHLORIDE 0.9 % IV SOLN
12.5000 mg | Freq: Four times a day (QID) | INTRAVENOUS | Status: DC | PRN
  Administered 2015-02-25 – 2015-02-26 (×3): 12.5 mg via INTRAVENOUS
  Filled 2015-02-25 (×7): qty 0.25

## 2015-02-25 MED ORDER — VITAMIN D 50 MCG (2000 UT) PO TABS: 2000.0000 [IU] | ORAL_TABLET | Freq: Every day | ORAL | Status: DC

## 2015-02-25 MED ORDER — POTASSIUM CHLORIDE CRYS ER 20 MEQ PO TBCR
20.0000 meq | EXTENDED_RELEASE_TABLET | Freq: Every morning | ORAL | Status: DC
  Administered 2015-02-25 – 2015-02-27 (×3): 20 meq via ORAL
  Filled 2015-02-25 (×3): qty 1

## 2015-02-25 MED ORDER — VANCOMYCIN HCL IN DEXTROSE 1-5 GM/200ML-% IV SOLN: 1000.0000 mg | Freq: Three times a day (TID) | INTRAVENOUS | Status: DC

## 2015-02-25 MED ORDER — HYDROXYUREA 500 MG PO CAPS
500.0000 mg | ORAL_CAPSULE | Freq: Every day | ORAL | Status: DC
  Administered 2015-02-25 – 2015-02-27 (×3): 500 mg via ORAL
  Filled 2015-02-25 (×3): qty 1

## 2015-02-25 MED ORDER — SODIUM CHLORIDE 0.9% FLUSH: 9.0000 mL | INTRAVENOUS | Status: DC | PRN

## 2015-02-25 MED ORDER — POLYETHYLENE GLYCOL 3350 17 G PO PACK: 17.0000 g | PACK | Freq: Every day | ORAL | Status: DC | PRN

## 2015-02-25 MED ORDER — ASPIRIN 81 MG PO CHEW
81.0000 mg | CHEWABLE_TABLET | Freq: Every day | ORAL | Status: DC
  Administered 2015-02-25 – 2015-02-27 (×3): 81 mg via ORAL
  Filled 2015-02-25 (×3): qty 1

## 2015-02-25 MED ORDER — VANCOMYCIN HCL IN DEXTROSE 1-5 GM/200ML-% IV SOLN
1000.0000 mg | Freq: Once | INTRAVENOUS | Status: DC
  Filled 2015-02-25: qty 200

## 2015-02-25 MED ORDER — DEXTROSE 5 % IV SOLN
2.0000 g | Freq: Once | INTRAVENOUS | Status: AC
  Administered 2015-02-25: 2 g via INTRAVENOUS
  Filled 2015-02-25: qty 2

## 2015-02-25 MED ORDER — HYDROMORPHONE HCL 2 MG/ML IJ SOLN
2.0000 mg | Freq: Once | INTRAMUSCULAR | Status: AC
  Administered 2015-02-25: 2 mg via INTRAVENOUS
  Filled 2015-02-25: qty 1

## 2015-02-25 MED ORDER — SODIUM CHLORIDE 0.9 % IV BOLUS (SEPSIS)
1000.0000 mL | Freq: Once | INTRAVENOUS | Status: AC
  Administered 2015-02-25: 1000 mL via INTRAVENOUS

## 2015-02-25 MED ORDER — DIPHENHYDRAMINE HCL 50 MG/ML IJ SOLN
25.0000 mg | Freq: Once | INTRAMUSCULAR | Status: AC
  Administered 2015-02-25: 25 mg via INTRAVENOUS
  Filled 2015-02-25: qty 1

## 2015-02-25 MED ORDER — KETOROLAC TROMETHAMINE 30 MG/ML IJ SOLN
30.0000 mg | Freq: Four times a day (QID) | INTRAMUSCULAR | Status: DC
  Administered 2015-02-25 – 2015-02-27 (×9): 30 mg via INTRAVENOUS
  Filled 2015-02-25 (×9): qty 1

## 2015-02-25 MED ORDER — CYCLOBENZAPRINE HCL 5 MG PO TABS
5.0000 mg | ORAL_TABLET | Freq: Three times a day (TID) | ORAL | Status: DC | PRN
Start: 2015-02-25 — End: 2015-02-27

## 2015-02-25 MED ORDER — LISINOPRIL 10 MG PO TABS
10.0000 mg | ORAL_TABLET | Freq: Every day | ORAL | Status: DC
  Administered 2015-02-25 – 2015-02-27 (×3): 10 mg via ORAL
  Filled 2015-02-25 (×3): qty 1

## 2015-02-25 MED ORDER — DEXTROSE-NACL 5-0.45 % IV SOLN
INTRAVENOUS | Status: DC
  Administered 2015-02-25 – 2015-02-26 (×2): via INTRAVENOUS
  Administered 2015-02-26: 1000 mL via INTRAVENOUS

## 2015-02-25 MED ORDER — HYDROMORPHONE HCL 2 MG/ML IJ SOLN
2.0000 mg | INTRAMUSCULAR | Status: AC
  Administered 2015-02-25 (×6): 2 mg via INTRAVENOUS
  Filled 2015-02-25 (×6): qty 1

## 2015-02-25 MED ORDER — DIPHENHYDRAMINE HCL 12.5 MG/5ML PO ELIX
12.5000 mg | ORAL_SOLUTION | Freq: Four times a day (QID) | ORAL | Status: DC | PRN
  Filled 2015-02-25: qty 5

## 2015-02-25 MED ORDER — NALOXONE HCL 0.4 MG/ML IJ SOLN: 0.4000 mg | INTRAMUSCULAR | Status: DC | PRN

## 2015-02-25 MED ORDER — VITAMIN D 1000 UNITS PO TABS
2000.0000 [IU] | ORAL_TABLET | Freq: Every day | ORAL | Status: DC
  Administered 2015-02-25 – 2015-02-27 (×3): 2000 [IU] via ORAL
  Filled 2015-02-25 (×3): qty 2

## 2015-02-25 MED ORDER — HYDROMORPHONE 1 MG/ML IV SOLN
INTRAVENOUS | Status: DC
  Administered 2015-02-25: 5.5 mg via INTRAVENOUS
  Administered 2015-02-25: 14:00:00 via INTRAVENOUS
  Administered 2015-02-25: 7.5 mg via INTRAVENOUS
  Administered 2015-02-26: 9.5 mg via INTRAVENOUS
  Administered 2015-02-26: 4 mg via INTRAVENOUS
  Administered 2015-02-26: 3.8 mg via INTRAVENOUS
  Administered 2015-02-26: 4.5 mg via INTRAVENOUS
  Administered 2015-02-26: 2.5 mg via INTRAVENOUS
  Administered 2015-02-26: 8 mg via INTRAVENOUS
  Administered 2015-02-27: 01:00:00 via INTRAVENOUS
  Administered 2015-02-27: 7 mg via INTRAVENOUS
  Administered 2015-02-27: 5.5 mg via INTRAVENOUS
  Administered 2015-02-27: 2.5 mg via INTRAVENOUS
  Administered 2015-02-27: 6.5 mg via INTRAVENOUS
  Filled 2015-02-25 (×3): qty 25

## 2015-02-25 MED ORDER — SENNOSIDES-DOCUSATE SODIUM 8.6-50 MG PO TABS
1.0000 | ORAL_TABLET | Freq: Two times a day (BID) | ORAL | Status: DC
  Administered 2015-02-25 – 2015-02-27 (×4): 1 via ORAL
  Filled 2015-02-25 (×4): qty 1

## 2015-02-25 MED ORDER — METOPROLOL SUCCINATE ER 50 MG PO TB24
50.0000 mg | ORAL_TABLET | Freq: Every day | ORAL | Status: DC
  Administered 2015-02-25 – 2015-02-27 (×3): 50 mg via ORAL
  Filled 2015-02-25 (×3): qty 1

## 2015-02-25 MED ORDER — HYDROMORPHONE HCL 2 MG/ML IJ SOLN
2.0000 mg | INTRAMUSCULAR | Status: DC | PRN
  Administered 2015-02-26 – 2015-02-27 (×12): 2 mg via INTRAVENOUS
  Filled 2015-02-25 (×13): qty 1

## 2015-02-25 MED ORDER — ZOLPIDEM TARTRATE 10 MG PO TABS
10.0000 mg | ORAL_TABLET | Freq: Every evening | ORAL | Status: DC | PRN
  Administered 2015-02-25 – 2015-02-26 (×2): 10 mg via ORAL
  Filled 2015-02-25 (×2): qty 1

## 2015-02-25 MED ORDER — ONDANSETRON HCL 4 MG/2ML IJ SOLN: 4.0000 mg | Freq: Four times a day (QID) | INTRAMUSCULAR | Status: DC | PRN

## 2015-02-25 MED ORDER — MORPHINE SULFATE ER 30 MG PO TBCR
30.0000 mg | EXTENDED_RELEASE_TABLET | Freq: Two times a day (BID) | ORAL | Status: DC
  Administered 2015-02-25 – 2015-02-27 (×4): 30 mg via ORAL
  Filled 2015-02-25 (×4): qty 1

## 2015-02-25 MED ORDER — FOLIC ACID 1 MG PO TABS
1.0000 mg | ORAL_TABLET | Freq: Every morning | ORAL | Status: DC
  Administered 2015-02-25 – 2015-02-27 (×3): 1 mg via ORAL
  Filled 2015-02-25 (×3): qty 1

## 2015-02-25 MED ORDER — DEXTROSE 5 % IV SOLN: 1.0000 g | Freq: Three times a day (TID) | INTRAVENOUS | Status: DC

## 2015-02-25 MED ORDER — ENOXAPARIN SODIUM 120 MG/0.8ML ~~LOC~~ SOLN
120.0000 mg | SUBCUTANEOUS | Status: DC
Start: 2015-02-25 — End: 2015-02-27
  Administered 2015-02-25 – 2015-02-26 (×2): 120 mg via SUBCUTANEOUS
  Filled 2015-02-25 (×3): qty 0.8

## 2015-02-25 NOTE — Progress Notes (Signed)
ANTIBIOTIC CONSULT NOTE - INITIAL  Pharmacy Consult for Vancomycin, Cefepime Indication: pneumonia  No Known Allergies  Patient Measurements:   Adjusted Body Weight:   Vital Signs: Temp: 98.3 F (36.8 C) (01/25 0846) Temp Source: Oral (01/25 0846) BP: 123/83 mmHg (01/25 0846) Pulse Rate: 95 (01/25 0846) Intake/Output from previous day:   Intake/Output from this shift:    Labs:  Recent Labs  02/25/15 0934  WBC 21.8*  HGB 6.9*  PLT 511*  CREATININE 0.72   Estimated Creatinine Clearance: 138.2 mL/min (by C-G formula based on Cr of 0.72). No results for input(s): VANCOTROUGH, VANCOPEAK, VANCORANDOM, GENTTROUGH, GENTPEAK, GENTRANDOM, TOBRATROUGH, TOBRAPEAK, TOBRARND, AMIKACINPEAK, AMIKACINTROU, AMIKACIN in the last 72 hours.   Microbiology: Recent Results (from the past 720 hour(s))  MRSA PCR Screening     Status: Abnormal   Collection Time: 02/03/15  2:12 PM  Result Value Ref Range Status   MRSA by PCR POSITIVE (A) NEGATIVE Final    Comment:        The GeneXpert MRSA Assay (FDA approved for NASAL specimens only), is one component of a comprehensive MRSA colonization surveillance program. It is not intended to diagnose MRSA infection nor to guide or monitor treatment for MRSA infections. RESULT CALLED TO, READ BACK BY AND VERIFIED WITH: MELTON,A @ [id number] ON [id number] BY POTEAT,S     Medical History: Past Medical History  Diagnosis Date  . Sickle cell anemia (HCC)   . Blood transfusion   . Acute embolism and thrombosis of right internal jugular vein ([place])   . Hypokalemia   . Mood disorder ([place])   . History of pulmonary embolus (PE)   . Avascular necrosis (HCC)     Right Hip  . Leukocytosis     Chronic  . Thrombocytosis (HCC)     Chronic  . Hypertension   . History of Clostridium difficile infection   . Uses marijuana   . Chronic anticoagulation   . Functional asplenia   . Former smoker   . Second hand tobacco smoke exposure   . Alcohol consumption of  one to four drinks per day   . Noncompliance with medication regimen   . Sickle-cell crisis with associated acute chest syndrome ([place]) 05/13/2013  . Acute chest syndrome ([place]) 06/18/2013  . Demand ischemia ([place]) 01/02/2014  . Pulmonary hypertension (HCC)      Assessment: 47 yoM with sickle cell disease presents with crisis.  CXR in ED: Question developing RIGHT upper lobe infiltrate.  Starting empiric vancomycin and cefepime.  WBC elevated, CrCl>100 ml/min.  Antimicrobials this admission: 1/25 vancomycin >>  1/25 cefepime >>   Levels/dose changes this admission: -  Microbiology Results: 1/25 BCx: sent  1/25 UCx: sent   Goal of Therapy:  Vancomycin trough level 15-20 mcg/ml Doses adjusted per renal function Eradication of infection  Plan:  1.  Cefepime 1g IV q8h after 2g dose ordered in ED. 2.  Vancomycin 1g IV q8h. 3.  F/u SCr, trough levels, clinical course.  Hershal Coria 02/25/2015,11:03 AM

## 2015-02-25 NOTE — ED Notes (Signed)
He c/o sickle cell pain in ribs/legs and also c/o generalized h/a.  He denies chest pain/fever/shob, nor any other sign of current illness.

## 2015-02-25 NOTE — ED Provider Notes (Signed)
CSN: [id number]     Arrival date & time 02/25/15  [id number] History   First MD Initiated Contact with Patient 02/25/15 0848     Chief Complaint  Patient presents with  . 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 Patient states that last night he began having worsening pain in his ribs and legs as well as generalized headache which is consistent with his usual pain crises. His pain is currently 5/10 in intensity. He took his usual pain medication regimen this morning including Dilaudid and MS Contin. He denies any chest pain, fevers, vomiting, diarrhea, rashes, or change in his chronic shortness of breath.  Patient is a 35 y.o. male presenting with sickle cell pain. The history is provided by the patient.  Sickle Cell Pain Crisis   Past Medical History  Diagnosis Date  . Sickle cell anemia (HCC)   . Blood transfusion   . Acute embolism and thrombosis of right internal jugular vein ([place])   . Hypokalemia   . Mood disorder ([place])   . History of pulmonary embolus (PE)   . Avascular necrosis (HCC)     Right Hip  . Leukocytosis     Chronic  . Thrombocytosis (HCC)     Chronic  . Hypertension   . History of Clostridium difficile infection   . Uses marijuana   . Chronic anticoagulation   . Functional asplenia   . Former smoker   . Second hand tobacco smoke exposure   . Alcohol consumption of one to four drinks per day   . Noncompliance with medication regimen   . Sickle-cell crisis with associated acute chest syndrome ([place]) 05/13/2013  . Acute chest syndrome ([place]) 06/18/2013  . Demand ischemia ([place]) 01/02/2014  . Pulmonary hypertension [facility])    Past Surgical History  Procedure  Laterality Date  . Right hip replacement      08/2006  . Cholecystectomy      01/2008  . Porta cath placement    . Porta cath removal    . Umbilical hernia repair      01/2008  . Excision of left periauricular cyst      10/2009  . Excision of right ear lobe cyst with primary closur      11/2007  . Portacath placement  01/05/2012    Procedure: INSERTION PORT-A-CATH;  Surgeon: Odis Hollingshead, MD;  Location: [place];  Service: General;  Laterality: N/A;  ultrasound guiced port a cath insertion with fluoroscopy   Family History  Problem Relation Age of Onset  . Sickle cell trait Mother   . Depression Mother   . Diabetes Mother   . Sickle cell trait Father   . Sickle cell trait Brother    Social History  Substance Use Topics  . Smoking status: Former Smoker -- 13 years  . Smokeless tobacco: Never Used  . Alcohol Use: No    Review of Systems 10 Systems reviewed and are negative for acute change except as noted in the HPI.    Allergies  Review of patient's allergies indicates no known allergies.  Home Medications   Prior to Admission medications   Medication Sig Start Date End Date Taking? Authorizing Provider  aspirin 81 MG chewable tablet Chew 1 tablet (81 mg  total) by mouth daily. 07/23/14   [name], MD  Cholecalciferol (VITAMIN D) 2000 units tablet Take 1 tablet (2,000 Units total) by mouth daily. 02/23/15   Dorena Dew, FNP  cyclobenzaprine (FLEXERIL) 5 MG tablet Take 1 tablet (5 mg total) by mouth 3 (three) times daily as needed for muscle spasms. 02/19/15   Alfonzo Beers, MD  enoxaparin (LOVENOX) 120 MG/0.8ML injection Inject 0.8 mLs (120 mg total) into the skin daily. 12/03/14   [name], MD  folic acid (FOLVITE) 1 MG tablet Take 1 tablet (1 mg total) by mouth every morning. 02/23/15   Dorena Dew, FNP  HYDROmorphone (DILAUDID) 4 MG tablet Take 1 tablet (4 mg total) by mouth every 4 (four) hours as needed for severe pain. 01/29/15   Dorena Dew, FNP  hydroxyurea (HYDREA) 500 MG capsule Take 1 capsule (500 mg total) by mouth daily. May take with food to minimize GI side effects. 11/06/14   Dorena Dew, FNP  lisinopril (PRINIVIL,ZESTRIL) 10 MG tablet Take 1 tablet (10 mg total) by mouth daily. 01/22/14   [name], MD  metoprolol succinate (TOPROL-XL) 50 MG 24 hr tablet Take 1 tablet (50 mg total) by mouth daily. 02/23/15   Dorena Dew, FNP  morphine (MS CONTIN) 30 MG 12 hr tablet Take 1 tablet (30 mg total) by mouth every 12 (twelve) hours. 01/29/15   Dorena Dew, FNP  potassium chloride SA (K-DUR,KLOR-CON) 20 MEQ tablet Take 1 tablet (20 mEq total) by mouth every morning. 02/23/15   Dorena Dew, FNP  zolpidem (AMBIEN) 10 MG tablet Take 1 tablet (10 mg total) by mouth at bedtime as needed for sleep. 01/13/15   Dorena Dew, FNP   BP 123/83 mmHg  Pulse 95  Temp(Src) 98.3 F (36.8 C) (Oral)  Resp 18  SpO2 84% Physical Exam  Constitutional: He is oriented to person, place, and time. He appears well-developed and well-nourished. No distress.  HENT:  Head: Normocephalic and atraumatic.  Mouth/Throat: Oropharynx is clear and moist.  Moist mucous membranes  Eyes: Pupils are equal, round, and reactive to light. Scleral icterus is present.  Neck: Neck supple.  Cardiovascular: Normal rate, regular rhythm and normal heart sounds.   No murmur heard. Pulmonary/Chest: Effort normal and breath sounds normal.  On O2 [place], port in L upper chest  Abdominal: Soft. Bowel sounds are normal. He exhibits no distension. There is no tenderness.  Musculoskeletal: He exhibits no edema.  Neurological: He is alert and oriented to person, place, and time.  Fluent speech  Skin: Skin is warm and dry. No rash noted.  Psychiatric: Judgment normal.  Flat affect  Nursing note and vitals reviewed.   ED Course  Procedures (including critical care time) Labs Review Labs Reviewed  BASIC METABOLIC PANEL - Abnormal; Notable  for the following:    Chloride 113 (*)    Glucose, Bld 113 (*)    Calcium 8.8 (*)    All other components within normal limits  CBC WITH DIFFERENTIAL/PLATELET - Abnormal; Notable for the following:    WBC 21.8 (*)    RBC 2.25 (*)    Hemoglobin 6.9 (*)    HCT 20.5 (*)    RDW 22.0 (*)    Platelets 511 (*)    Neutro Abs 14.8 (*)    Monocytes Absolute 3.3 (*)    Basophils Absolute 0.2 (*)    All other components within normal limits  RETICULOCYTES - Abnormal; Notable for  the following:    Retic Ct Pct >23.0 (*)    RBC. 2.25 (*)    All other components within normal limits  CULTURE, BLOOD (ROUTINE X 2)  CULTURE, BLOOD (ROUTINE X 2)  URINE CULTURE  URINALYSIS, ROUTINE W REFLEX MICROSCOPIC (NOT AT [facility])  I-STAT CG4 LACTIC ACID, ED    Imaging Review Dg Chest 2 View  02/25/2015  CLINICAL DATA:  Sickle cell crisis, pain in ribs and legs, generalized headache, former smoker, leukocytosis EXAM: CHEST  2 VIEW COMPARISON:  01/31/2015 FINDINGS: LEFT subclavian Port-A-Cath with tip projecting over SVC near cavoatrial junction. Enlargement of cardiac silhouette with pulmonary vascular congestion. Mediastinal contour stable. Increased opacity in RIGHT upper lobe suspicious for pneumonia. Remaining lungs clear. Mild chronic central peribronchial thickening. No pleural effusion or pneumothorax. Osseous structures stable IMPRESSION: Enlargement of cardiac silhouette with pulmonary vascular congestion. Question developing RIGHT upper lobe infiltrate. Electronically Signed   By: Lavonia Dana M.D.   On: 02/25/2015 10:49   I have personally reviewed and evaluated these lab results as part of my medical decision-making.  Medications  ceFEPIme (MAXIPIME) 2 g in dextrose 5 % 50 mL IVPB (2 g Intravenous New Bag/Given 02/25/15 1131)  vancomycin (VANCOCIN) IVPB 1000 mg/200 mL premix (not administered)  ceFEPIme (MAXIPIME) 1 g in dextrose 5 % 50 mL IVPB (not administered)  vancomycin (VANCOCIN) IVPB 1000 mg/200  mL premix (not administered)  HYDROmorphone (DILAUDID) injection 2 mg (2 mg Intravenous Given 02/25/15 0924)  sodium chloride 0.9 % bolus 1,000 mL (1,000 mLs Intravenous New Bag/Given 02/25/15 1131)  HYDROmorphone (DILAUDID) injection 2 mg (2 mg Intravenous Given 02/25/15 1131)  diphenhydrAMINE (BENADRYL) injection 25 mg (25 mg Intravenous Given 02/25/15 1132)     MDM   Final diagnoses:  Hospital-acquired pneumonia  Sickle cell pain crisis ([place])   Pt w/ sickle cell anemia p/w pain in ribs, legs, and head that he states is consistent with a usual pain crisis. On exam he was comfortable with normal work of breathing. O2 sat 93% on 3 L; he states he is normally on 4L at home. No reports of infectious symptoms. Obtained above lab work to rule out worsening anemia or aplastic crisis. Gave the patient Dilaudid for pain and later repeat dilaudid w/ benadryl.  Obtained lab work which showed WBC 22,000 and and reassuring reticulocyte count. I reviewed the patient's recent lab work and it appears that he has not previously had this degree of leukocytosis even in the setting of pain crisis. Because of concern for infectious process, obtained UA and chest x-ray. Chest x-ray shows developing right upper lobe infiltrate. On reexamination, the patient remains stable on his home level of oxygen. Gave the patient vancomycin and cefepime for HCAP. Discussed admission w/ Dr. Zigmund Daniel and pt admitted for further care.  Sharlett Iles, MD 02/25/15 1200

## 2015-02-25 NOTE — H&P (Signed)
Hospital Admission Note Date: 02/25/2015  Patient name: [name] Medical record number: [id number] Date of birth: [birth year] Age: 35 y.o. Gender: male PCP: [name], MD  Attending physician: [name], MD  Chief Complaint:Pain in ribs and legs since last night.  History of Present Illness: Opiate tolerant patient with Hb SS presents with c/o pain in ribs and legs since last night. He states that he was doing well and had a sudden increase in his pain which he was unable to manage with his oral analgesics. He describes the pain as throbbing and intermittently sharp. He also c/o intermittent headache which is localized to the temples, intermittent and throbbing in nature. However HA no present currently. He denies adn f/c, cough, increased SOB, N/V/D.   In the ED he received 3 doses of Dilaudid and pain is still present. He also had a CXR and it suggested a suspicion for developing RUL pneumonia. This along with an elevated WBC led to the prescription of Cefepime and Vancomycin by Dr. Rex Kras. The patient received Cefepime however the Vancomycin was discontinued.   Scheduled Meds:  Continuous Infusions: . ceFEPime (MAXIPIME) IV    . vancomycin    . vancomycin     PRN Meds:. Allergies: Review of patient's allergies indicates no known allergies. Past Medical History  Diagnosis Date  . Sickle cell anemia (HCC)   . Blood transfusion   . Acute embolism and thrombosis of right internal jugular vein ([place])   . Hypokalemia   . Mood disorder ([place])   . History of pulmonary embolus (PE)   . Avascular necrosis (HCC)     Right Hip  . Leukocytosis     Chronic  . Thrombocytosis (HCC)     Chronic  . Hypertension   . History of Clostridium difficile infection   . Uses marijuana   . Chronic anticoagulation   . Functional asplenia   . Former smoker   . Second hand tobacco smoke exposure   . Alcohol consumption of one to four drinks per day   . Noncompliance with  medication regimen   . Sickle-cell crisis with associated acute chest syndrome ([place]) 05/13/2013  . Acute chest syndrome ([place]) 06/18/2013  . Demand ischemia ([place]) 01/02/2014  . Pulmonary hypertension [facility])    Past Surgical History  Procedure Laterality Date  . Right hip replacement      08/2006  . Cholecystectomy      01/2008  . Porta cath placement    . Porta cath removal    . Umbilical hernia repair      01/2008  . Excision of left periauricular cyst      10/2009  . Excision of right ear lobe cyst with primary closur      11/2007  . Portacath placement  01/05/2012    Procedure: INSERTION PORT-A-CATH;  Surgeon: Odis Hollingshead, MD;  Location: [place];  Service: General;  Laterality: N/A;  ultrasound guiced port a cath insertion with fluoroscopy   Family History  Problem Relation Age of Onset  . Sickle cell trait Mother   . Depression Mother   . Diabetes Mother   . Sickle cell trait Father   . Sickle cell trait Brother    Social History   Social History  . Marital Status: Single    Spouse Name: N/A  . Number of Children: 0  . Years of Education: 13   Occupational History  . 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 Smoking status: Former Smoker -- 13 years  . Smokeless tobacco: Never Used  . Alcohol Use: No  . Drug Use: 2.00 per week    Special: Marijuana     Comment: Marijuana weekly  . Sexual Activity:    Partners: Female    Museum/gallery curator: None     Comment: month ago   Other Topics Concern  . Not on file   Social History Narrative   Lives in an apartment.  Single.  Lives alone but has a girlfriend that helps care for him.  Does not use any assist devices.        [name]  [phone] Mom, emergency contact      [facility]:   Patient continuing to live in her apartment in town alone. 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   Review of Systems: A comprehensive review of systems was  negative except as noted in the HPI. Physical Exam: No intake or output data in the 24 hours ending 02/25/15 1206 General: Alert, awake, oriented x3, in no acute distress.  HEENT: [place]/AT PEERL, EOMI, anicteric Neck: Trachea midline,  no masses, no thyromegal,y no JVD, no carotid bruit OROPHARYNX:  Moist, No exudate/ erythema/lesions.  Heart: Regular rate and rhythm, without murmurs, rubs, gallops, PMI non-displaced, no heaves or thrills on palpation.  Lungs: Clear to auscultation, no wheezing or rhonchi noted. No increased vocal fremitus resonant to percussion. No egophony changes. Abdomen: Soft, nontender, nondistended, positive bowel sounds, no masses no hepatosplenomegaly noted..  Neuro: No focal neurological deficits noted cranial nerves II through XII grossly intact. Strength at functional baseline in bilateral upper and lower extremities. Musculoskeletal: No warm swelling or erythema around joints, no spinal tenderness noted. Psychiatric: Patient alert and oriented x3, good insight and cognition, good recent to remote recall.   Lab results:  Recent Labs  02/25/15 0934  NA 142  K 4.2  CL 113*  CO2 23  GLUCOSE 113*  BUN 9  CREATININE 0.72  CALCIUM 8.8*   No results for input(s): AST, ALT, ALKPHOS, BILITOT, PROT, ALBUMIN in the last 72 hours. No results for input(s): LIPASE, AMYLASE in the last 72 hours.  Recent Labs  02/25/15 0934  WBC 21.8*  NEUTROABS 14.8*  HGB 6.9*  HCT 20.5*  MCV 91.1  PLT 511*   No results for input(s): CKTOTAL, CKMB, CKMBINDEX, TROPONINI in the last 72 hours. Invalid input(s): POCBNP No results for input(s): DDIMER in the last 72 hours. No results for input(s): HGBA1C in the last 72 hours. No results for input(s): CHOL, HDL, LDLCALC, TRIG, CHOLHDL, LDLDIRECT in the last 72 hours. No results for input(s): TSH, T4TOTAL, T3FREE, THYROIDAB in the last 72 hours.  Invalid input(s): FREET3  Recent Labs  02/25/15 0934  RETICCTPCT >23.0*    Imaging results:  Dg Chest 2 View  02/25/2015  CLINICAL DATA:  Sickle cell crisis, pain in ribs and legs, generalized headache, former smoker, leukocytosis EXAM: CHEST  2 VIEW COMPARISON:  01/31/2015 FINDINGS: LEFT subclavian Port-A-Cath with tip projecting over SVC near cavoatrial junction. Enlargement of cardiac silhouette with pulmonary vascular congestion. Mediastinal contour stable. Increased opacity in RIGHT upper lobe suspicious for pneumonia. Remaining lungs clear. Mild chronic central peribronchial thickening. No pleural effusion or pneumothorax. Osseous structures stable IMPRESSION: Enlargement of cardiac silhouette with pulmonary vascular congestion. Question developing RIGHT upper lobe infiltrate. Electronically Signed   By: Lavonia Dana M.D.   On: 02/25/2015 10:49  Dg Chest 2 View  01/31/2015  CLINICAL DATA:  Patient with sickle cell disease and acute chest pain. Left rib pain. EXAM: CHEST  2 VIEW COMPARISON:  Chest radiograph 01/26/2015. FINDINGS: Monitoring leads overlie the patient. Left anterior chest wall Port-A-Cath is present with tip projecting over the superior cavoatrial junction, stable. Stable cardiomegaly. Unchanged bilateral coarse interstitial pulmonary opacities. No new area of superimposed pulmonary consolidation. No pleural effusion or pneumothorax. Regional skeleton is unremarkable. Cholecystectomy clips. IMPRESSION: No active cardiopulmonary disease. Electronically Signed   By: Lovey Newcomer M.D.   On: 01/31/2015 18:37     Assessment and Plan:  Hb SS with Crisis: Will start patient on individualized PCA, clinician assisted doses and Toradol. Will also give hypotonic IVF. Re-assess pain in 8-12 hours.  Leukocytosis: Pt has no "left shift" and no clinical signs of infection. I have personally reviewed the CXR there is no clear findings of pneumonia. He received one dose of Cefepime in the ED however will hold on further antibiotics. He is known to have a robust  leukocytosis during crisis and has baseline WBC of 15-16.   Anemia of chronic disease: Hb currently at 7 which is has baseline. However, I suspect some decrease in Hb with dilution with IVF.   Headache: Pt c/o an intermittent throbbing headache in the temples. Currently he does not have a HA. Will observe for persistence and evaluate further as necessary.  Chronic Pain: Continue MS Contin.  Chronic Anticoagulation: Pt on Lovenox for recurrent PE. 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  Time spent 1 hour  Mc Bloodworth A. 02/25/2015, 12:06 PM

## 2015-02-25 NOTE — ED Notes (Signed)
Dr. [name] at bedside

## 2015-02-25 NOTE — Telephone Encounter (Signed)
Patient C/O pain to ribs and back that is 7/10 on pain scale.  Patient denies chest pain, shortness of breath, N/V/D and abdominal pain.  Patient placed on hold and discussed with [place], NP.  Per [place], patient needs to take pain medications as prescribed, hydrate and rest.  Patient verbalizes understanding.

## 2015-02-26 DIAGNOSIS — Z7901 Long term (current) use of anticoagulants: Secondary | ICD-10-CM | POA: Diagnosis not present

## 2015-02-26 DIAGNOSIS — J9621 Acute and chronic respiratory failure with hypoxia: Secondary | ICD-10-CM

## 2015-02-26 DIAGNOSIS — D5700 Hb-SS disease with crisis, unspecified: Secondary | ICD-10-CM | POA: Diagnosis not present

## 2015-02-26 DIAGNOSIS — D638 Anemia in other chronic diseases classified elsewhere: Secondary | ICD-10-CM | POA: Diagnosis not present

## 2015-02-26 DIAGNOSIS — J189 Pneumonia, unspecified organism: Secondary | ICD-10-CM | POA: Diagnosis not present

## 2015-02-26 LAB — URINE CULTURE: SPECIAL REQUESTS: NORMAL

## 2015-02-26 LAB — CBC WITH DIFFERENTIAL/PLATELET
Basophils Absolute: 0.2 10*3/uL — ABNORMAL HIGH (ref 0.0–0.1)
Basophils Relative: 1 %
EOS ABS: 0.6 10*3/uL (ref 0.0–0.7)
Eosinophils Relative: 3 %
HCT: 21.2 % — ABNORMAL LOW (ref 39.0–52.0)
HEMOGLOBIN: 6.9 g/dL — AB (ref 13.0–17.0)
LYMPHS ABS: 3.3 10*3/uL (ref 0.7–4.0)
Lymphocytes Relative: 16 %
MCH: 30.4 pg (ref 26.0–34.0)
MCHC: 32.5 g/dL (ref 30.0–36.0)
MCV: 93.4 fL (ref 78.0–100.0)
MONO ABS: 2.9 10*3/uL — AB (ref 0.1–1.0)
Monocytes Relative: 14 %
NEUTROS ABS: 13.6 10*3/uL — AB (ref 1.7–7.7)
Neutrophils Relative %: 66 %
PLATELETS: 528 10*3/uL — AB (ref 150–400)
RBC: 2.27 MIL/uL — AB (ref 4.22–5.81)
RDW: 22.4 % — AB (ref 11.5–15.5)
WBC: 20.6 10*3/uL — AB (ref 4.0–10.5)
nRBC: 4 /100 WBC — ABNORMAL HIGH

## 2015-02-26 LAB — PREPARE RBC (CROSSMATCH)

## 2015-02-26 LAB — RETICULOCYTES
RBC.: 2.27 MIL/uL — ABNORMAL LOW (ref 4.22–5.81)
Retic Count, Absolute: 538 10*3/uL — ABNORMAL HIGH (ref 19.0–186.0)

## 2015-02-26 MED ORDER — DIPHENHYDRAMINE HCL 25 MG PO CAPS: 25.0000 mg | ORAL_CAPSULE | ORAL | Status: DC | PRN

## 2015-02-26 MED ORDER — SODIUM CHLORIDE 0.9 % IV SOLN
Freq: Once | INTRAVENOUS | Status: AC
  Administered 2015-02-26: 16:00:00 via INTRAVENOUS

## 2015-02-26 MED ORDER — SODIUM CHLORIDE 0.9 % IV SOLN
25.0000 mg | INTRAVENOUS | Status: DC | PRN
  Administered 2015-02-26 – 2015-02-27 (×2): 25 mg via INTRAVENOUS
  Filled 2015-02-26 (×5): qty 0.5

## 2015-02-26 NOTE — Progress Notes (Signed)
SICKLE CELL SERVICE PROGRESS NOTE  [name] [id number] DOB: [birth year] DOA: 02/25/2015 PCP: [name], MD  Assessment/Plan: Active Problems:   Chronic anticoagulation   Hb-SS disease with crisis ([place])   Chronic respiratory failure with hypoxia (H[facility])   Anemia of chronic disease   Leukocytosis  1. Hb SS with crisis: Pt reports that pain is at 6-7/10 in the ribs. He has used 28.5 mg with 60/57:demands/deliveries in the past 23 hours in addition to 20 mg in clinician assisted doses. Will continue PAC and clinician doses as scheduled and re-assess in about 4 hours to progress weaning. Continue Toradol and IVF.  2. Leukocytosis: Pt still has no evidence of infection. This is likely related to crisis and anemia. Will continue to monitor without antibiotics.  3. Anemia of chronic disease: Pt seems to have a threshold of increased hypoxemia when Hb gets below 7.5. Will transfuse 1 unit of blood to improve oxygenation and suppress bone marrow. He would likely benefit from serial Red Cell Pheresis. 4. Chronic pain: Continue MS Contin. 5. Acute on Chronic respiratory failure with hypoxia: Pt having and increase Oxygen requirement which I feel is secondary to the anemia. Will re-assess after transfusion.  Code Status: Full Code Family Communication: N/A Disposition Plan: Not yet ready for discharge  [place].  Pager [phone]. If 7PM-7AM, please contact night-coverage.  02/26/2015, 10:37 AM  LOS: 1 day   Interim History: Pt's pain improved since yesterday.Pt reports that pain is at 6-7/10 in the ribs. He had a BM yesterday.  Consultants:  None  Procedures:  None  Antibiotics:  Cefepime 1/25>>1/25   Objective: Filed Vitals:   02/26/15 0358 02/26/15 0501 02/26/15 0812 02/26/15 0935  BP:  116/68  128/79  Pulse:  83  79  Temp:  98.4 F (36.9 C)  98.2 F (36.8 C)  TempSrc:  Oral  Oral  Resp: 20 24 21 20   Height:      Weight:  167 lb 3.2 oz (75.841 kg)     SpO2: 92% 96% 94% 91%   Weight change:   Intake/Output Summary (Last 24 hours) at 02/26/15 1037 Last data filed at 02/26/15 0935  Gross per 24 hour  Intake   1366 ml  Output    650 ml  Net    716 ml    General: Alert, awake, oriented x3, in no acute distress.  HEENT: [place]/AT PEERL, EOMI, anicteric Neck: Trachea midline,  no masses, no thyromegal,y no JVD, no carotid bruit OROPHARYNX:  Moist, No exudate/ erythema/lesions.  Heart: Regular rate and rhythm, without murmurs, rubs, gallops, PMI non-displaced, no heaves or thrills on palpation.  Lungs: Clear to auscultation, no wheezing or rhonchi noted. No increased vocal fremitus resonant to percussion  Abdomen: Soft, nontender, nondistended, positive bowel sounds, no masses no hepatosplenomegaly noted.  Neuro: No focal neurological deficits noted cranial nerves II through XII grossly intact.  Strength at functional baseline in bilateral upper and lower extremities. Musculoskeletal: No warmth swelling or erythema around joints, no spinal tenderness noted. Psychiatric: Patient alert and oriented x3, good insight and cognition, good recent to remote recall. Lymph node survey: No cervical axillary or inguinal lymphadenopathy noted.   Data Reviewed: Basic Metabolic Panel:  Recent Labs Lab 02/25/15 0934  NA 142  K 4.2  CL 113*  CO2 23  GLUCOSE 113*  BUN 9  CREATININE 0.72  CALCIUM 8.8*   Liver Function Tests: No results for input(s): AST, ALT, ALKPHOS, BILITOT, PROT, ALBUMIN in the last 168 hours.  No results for input(s): LIPASE, AMYLASE in the last 168 hours. No results for input(s): AMMONIA in the last 168 hours. CBC:  Recent Labs Lab 02/25/15 0934 02/26/15 0400  WBC 21.8* 20.6*  NEUTROABS 14.8* 13.6*  HGB 6.9* 6.9*  HCT 20.5* 21.2*  MCV 91.1 93.4  PLT 511* 528*   Cardiac Enzymes: No results for input(s): CKTOTAL, CKMB, CKMBINDEX, TROPONINI in the last 168 hours. BNP (last 3 results)  Recent Labs   08/29/14 0810 09/10/14 0612 11/30/14 0910  BNP 1117.0* 549.5* 639.7*    ProBNP (last 3 results) No results for input(s): PROBNP in the last 8760 hours.  CBG: No results for input(s): GLUCAP in the last 168 hours.  Recent Results (from the past 240 hour(s))  Urine culture     Status: None   Collection Time: 02/25/15 10:49 AM  Result Value Ref Range Status   Specimen Description URINE, CLEAN CATCH  Final   Special Requests Normal  Final   Culture   Final    9,000 COLONIES/mL INSIGNIFICANT GROWTH Performed at [facility]    Report Status 02/26/2015 FINAL  Final     Studies: Dg Chest 2 View  02/25/2015  CLINICAL DATA:  Sickle cell crisis, pain in ribs and legs, generalized headache, former smoker, leukocytosis EXAM: CHEST  2 VIEW COMPARISON:  01/31/2015 FINDINGS: LEFT subclavian Port-A-Cath with tip projecting over SVC near cavoatrial junction. Enlargement of cardiac silhouette with pulmonary vascular congestion. Mediastinal contour stable. Increased opacity in RIGHT upper lobe suspicious for pneumonia. Remaining lungs clear. Mild chronic central peribronchial thickening. No pleural effusion or pneumothorax. Osseous structures stable IMPRESSION: Enlargement of cardiac silhouette with pulmonary vascular congestion. Question developing RIGHT upper lobe infiltrate. Electronically Signed   By: Lavonia Dana M.D.   On: 02/25/2015 10:49   Dg Chest 2 View  01/31/2015  CLINICAL DATA:  Patient with sickle cell disease and acute chest pain. Left rib pain. EXAM: CHEST  2 VIEW COMPARISON:  Chest radiograph 01/26/2015. FINDINGS: Monitoring leads overlie the patient. Left anterior chest wall Port-A-Cath is present with tip projecting over the superior cavoatrial junction, stable. Stable cardiomegaly. Unchanged bilateral coarse interstitial pulmonary opacities. No new area of superimposed pulmonary consolidation. No pleural effusion or pneumothorax. Regional skeleton is unremarkable.  Cholecystectomy clips. IMPRESSION: No active cardiopulmonary disease. Electronically Signed   By: Lovey Newcomer M.D.   On: 01/31/2015 18:37    Scheduled Meds: . sodium chloride   Intravenous Once  . aspirin  81 mg Oral Daily  . cholecalciferol  2,000 Units Oral Daily  . enoxaparin  120 mg Subcutaneous Q24H  . folic acid  1 mg Oral q morning - 10a  . HYDROmorphone   Intravenous 6 times per day  . hydroxyurea  500 mg Oral Daily  . ketorolac  30 mg Intravenous 4 times per day  . lisinopril  10 mg Oral Daily  . metoprolol succinate  50 mg Oral Daily  . morphine  30 mg Oral Q12H  . potassium chloride SA  20 mEq Oral q morning - 10a  . senna-docusate  1 tablet Oral BID   Continuous Infusions: . dextrose 5 % and 0.45% NaCl 100 mL/hr at 02/26/15 0227    Time spent 35 minutes.

## 2015-02-26 NOTE — Plan of Care (Signed)
Problem: Phase I Progression Outcomes Goal: Pain controlled with appropriate interventions Outcome: Progressing Pain down to 5/10 from 8/10 on admit.

## 2015-02-27 DIAGNOSIS — Z7901 Long term (current) use of anticoagulants: Secondary | ICD-10-CM | POA: Diagnosis not present

## 2015-02-27 DIAGNOSIS — J9611 Chronic respiratory failure with hypoxia: Secondary | ICD-10-CM | POA: Diagnosis not present

## 2015-02-27 DIAGNOSIS — J189 Pneumonia, unspecified organism: Secondary | ICD-10-CM | POA: Diagnosis not present

## 2015-02-27 DIAGNOSIS — D5700 Hb-SS disease with crisis, unspecified: Secondary | ICD-10-CM | POA: Diagnosis not present

## 2015-02-27 DIAGNOSIS — D638 Anemia in other chronic diseases classified elsewhere: Secondary | ICD-10-CM | POA: Diagnosis not present

## 2015-02-27 LAB — BASIC METABOLIC PANEL
ANION GAP: 7 (ref 5–15)
BUN: 12 mg/dL (ref 6–20)
CHLORIDE: 109 mmol/L (ref 101–111)
CO2: 21 mmol/L — ABNORMAL LOW (ref 22–32)
Calcium: 8.7 mg/dL — ABNORMAL LOW (ref 8.9–10.3)
Creatinine, Ser: 0.68 mg/dL (ref 0.61–1.24)
GFR calc non Af Amer: >60 mL/min (ref >60)
Glucose, Bld: 118 mg/dL — ABNORMAL HIGH (ref 65–99)
POTASSIUM: 4.5 mmol/L (ref 3.5–5.1)
SODIUM: 137 mmol/L (ref 135–145)

## 2015-02-27 LAB — CBC WITH DIFFERENTIAL/PLATELET
BASOS ABS: 0.1 10*3/uL (ref 0.0–0.1)
BASOS PCT: 0 %
EOS ABS: 0.7 10*3/uL (ref 0.0–0.7)
Eosinophils Relative: 4 %
HCT: 22.3 % — ABNORMAL LOW (ref 39.0–52.0)
HEMOGLOBIN: 7.5 g/dL — AB (ref 13.0–17.0)
Lymphocytes Relative: 17 %
Lymphs Abs: 2.8 10*3/uL (ref 0.7–4.0)
MCH: 30.6 pg (ref 26.0–34.0)
MCHC: 33.6 g/dL (ref 30.0–36.0)
MCV: 91 fL (ref 78.0–100.0)
MONOS PCT: 12 %
Monocytes Absolute: 1.9 10*3/uL — ABNORMAL HIGH (ref 0.1–1.0)
NEUTROS PCT: 66 %
Neutro Abs: 10.6 10*3/uL — ABNORMAL HIGH (ref 1.7–7.7)
Platelets: 469 10*3/uL — ABNORMAL HIGH (ref 150–400)
RBC: 2.45 MIL/uL — ABNORMAL LOW (ref 4.22–5.81)
RDW: 22.1 % — ABNORMAL HIGH (ref 11.5–15.5)
WBC: 16 10*3/uL — ABNORMAL HIGH (ref 4.0–10.5)

## 2015-02-27 MED ORDER — HEPARIN SOD (PORK) LOCK FLUSH 100 UNIT/ML IV SOLN
500.0000 [IU] | Freq: Once | INTRAVENOUS | Status: DC
  Filled 2015-02-27: qty 5

## 2015-02-27 NOTE — Care Management Note (Signed)
Case Management Note  Patient Details  Name: CHRISOPHER HOUSHOLDER MRN: [id number] Date of Birth: April 08, 1980  Subjective/Objective:    35 yo admitted with [facility]                Action/Plan: Pt from home alone. Per pt he is on 4L02 chronically at home and his 52 is supplied by [facility]. Pt does not have a travel tank to DC home today and per pt, there is something wrong with his concentrator at home. This CM alerted [facility] of pt issues with his concentrator and need for travel tank to dc home. [facility] to bring travel tank to unit for discharge.  Expected Discharge Date:   (unknown)               Expected Discharge Plan:  Home/Self Care  In-House Referral:     Discharge planning Services  CM Consult  Post Acute Care Choice:    Choice offered to:     DME Arranged:    DME Agency:     HH Arranged:    [place] Agency:     Status of Service:  Completed, signed off  Medicare Important Message Given:    Date Medicare IM Given:    Medicare IM give by:    Date Additional Medicare IM Given:    Additional Medicare Important Message give by:     If discussed at [place] of Stay Meetings, dates discussed:    Additional Comments:  Lynnell Catalan, RN 02/27/2015, 12:34 PM

## 2015-02-27 NOTE — Plan of Care (Signed)
Problem: Phase I Progression Outcomes Goal: Pain controlled with appropriate interventions Outcome: Progressing Patient rating pain 5-6/10 in ribs. On PCA and toradol. q3h prn available.

## 2015-02-27 NOTE — Discharge Summary (Addendum)
[name] MRN: [id number] DOB/AGE: 04-16-1980 35 y.o.  Admit date: 02/25/2015 Discharge date: 02/27/2015  Primary Care Physician:  [name], MD   Discharge Diagnoses:   Patient Active Problem List   Diagnosis Date Noted  . Cor pulmonale, chronic ([place]) 08/25/2014    Priority: High  . Pulmonary HTN ([place]) 06/18/2013    Priority: High  . Functional asplenia     Priority: High  . Leukocytosis 02/25/2015    Priority: Medium  . Anemia of chronic disease 06/25/2014    Priority: Medium  . Chronic respiratory failure with hypoxia ([place]) 03/14/2014    Priority: Medium  . Hb-SS disease with crisis ([place]) 01/22/2014    Priority: Medium  . Chronic anticoagulation 08/22/2013    Priority: Medium  . Avascular necrosis ([place])     Priority: Low  . On home oxygen therapy 02/23/2015  . Anemia 01/31/2015  . Symptomatic anemia   . Syncope 11/30/2014  . Chest pain   . Hb-SS disease without crisis ([place]) 10/07/2014  . Acute respiratory failure with hypoxia ([place])   . Acute chest syndrome in sickle crisis ([place])   . PVC's (premature ventricular contractions) 09/10/2014  . Abnormal EKG 09/10/2014  . Hypoxemia   . Chronic atrial fibrillation ([place]) 09/09/2014  . Sickle cell crisis ([place]) 09/08/2014  . Bacteremia   . Elevated troponin I level 08/29/2014  . Ankle edema 07/07/2014  . Sickle cell anemia ([place]) 06/25/2014  . PAH (pulmonary artery hypertension) ([place]) 03/18/2014  . Paralytic strabismus, external ophthalmoplegia   . Chronic pain syndrome 12/12/2013  . Essential hypertension 08/22/2013  . Vitamin D deficiency 02/13/2013  . Embolism, pulmonary with infarction ([place]) 07/09/2012  . Hx of pulmonary embolus 06/29/2012  . Hemochromatosis 12/14/2011    DISCHARGE MEDICATION:   Medication List    TAKE these medications        aspirin 81 MG chewable tablet  Chew 1 tablet (81 mg total) by mouth daily.     cyclobenzaprine 5 MG tablet  Commonly known as:  FLEXERIL  Take 1  tablet (5 mg total) by mouth 3 (three) times daily as needed for muscle spasms.     enoxaparin 120 MG/0.8ML injection  Commonly known as:  LOVENOX  Inject 0.8 mLs (120 mg total) into the skin daily.     folic acid 1 MG tablet  Commonly known as:  FOLVITE  Take 1 tablet (1 mg total) by mouth every morning.     HYDROmorphone 4 MG tablet  Commonly known as:  DILAUDID  Take 1 tablet (4 mg total) by mouth every 4 (four) hours as needed for severe pain.     hydroxyurea 500 MG capsule  Commonly known as:  HYDREA  Take 1 capsule (500 mg total) by mouth daily. May take with food to minimize GI side effects.     lisinopril 10 MG tablet  Commonly known as:  PRINIVIL,ZESTRIL  Take 1 tablet (10 mg total) by mouth daily.     metoprolol succinate 50 MG 24 hr tablet  Commonly known as:  TOPROL-XL  Take 1 tablet (50 mg total) by mouth daily.     morphine 30 MG 12 hr tablet  Commonly known as:  MS CONTIN  Take 1 tablet (30 mg total) by mouth every 12 (twelve) hours.     potassium chloride SA 20 MEQ tablet  Commonly known as:  K-DUR,KLOR-CON  Take 1 tablet (20 mEq total) by mouth every morning.     Vitamin D 2000 units tablet  Take 1 tablet (2,000 Units total) by mouth daily.     zolpidem 10 MG tablet  Commonly known as:  AMBIEN  Take 1 tablet (10 mg total) by mouth at bedtime as needed for sleep.          Consults:     SIGNIFICANT DIAGNOSTIC STUDIES:  Dg Chest 2 View  02/25/2015  CLINICAL DATA:  Sickle cell crisis, pain in ribs and legs, generalized headache, former smoker, leukocytosis EXAM: CHEST  2 VIEW COMPARISON:  01/31/2015 FINDINGS: LEFT subclavian Port-A-Cath with tip projecting over SVC near cavoatrial junction. Enlargement of cardiac silhouette with pulmonary vascular congestion. Mediastinal contour stable. Increased opacity in RIGHT upper lobe suspicious for pneumonia. Remaining lungs clear. Mild chronic central peribronchial thickening. No pleural effusion or  pneumothorax. Osseous structures stable IMPRESSION: Enlargement of cardiac silhouette with pulmonary vascular congestion. Question developing RIGHT upper lobe infiltrate. Electronically Signed   By: Lavonia Dana M.D.   On: 02/25/2015 10:49   Dg Chest 2 View  01/31/2015  CLINICAL DATA:  Patient with sickle cell disease and acute chest pain. Left rib pain. EXAM: CHEST  2 VIEW COMPARISON:  Chest radiograph 01/26/2015. FINDINGS: Monitoring leads overlie the patient. Left anterior chest wall Port-A-Cath is present with tip projecting over the superior cavoatrial junction, stable. Stable cardiomegaly. Unchanged bilateral coarse interstitial pulmonary opacities. No new area of superimposed pulmonary consolidation. No pleural effusion or pneumothorax. Regional skeleton is unremarkable. Cholecystectomy clips. IMPRESSION: No active cardiopulmonary disease. Electronically Signed   By: Lovey Newcomer M.D.   On: 01/31/2015 18:37       Recent Results (from the past 240 hour(s))  Urine culture     Status: None   Collection Time: 02/25/15 10:49 AM  Result Value Ref Range Status   Specimen Description URINE, CLEAN CATCH  Final   Special Requests Normal  Final   Culture   Final    9,000 COLONIES/mL INSIGNIFICANT GROWTH Performed at [facility]    Report Status 02/26/2015 FINAL  Final  Culture, blood (routine x 2)     Status: None (Preliminary result)   Collection Time: 02/25/15 11:17 AM  Result Value Ref Range Status   Specimen Description BLOOD PORTA CATH  Final   Special Requests BOTTLES DRAWN AEROBIC AND ANAEROBIC 5CC  Final   Culture   Final    NO GROWTH 2 DAYS Performed at [facility]    Report Status PENDING  Incomplete  Culture, blood (routine x 2)     Status: None (Preliminary result)   Collection Time: 02/25/15 11:17 AM  Result Value Ref Range Status   Specimen Description BLOOD LEFT HAND  Final   Special Requests BOTTLES DRAWN AEROBIC AND ANAEROBIC 5CC  Final   Culture   Final     NO GROWTH 2 DAYS Performed at [facility]    Report Status PENDING  Incomplete    BRIEF ADMITTING H & P: Opiate tolerant patient with Hb SS presents with c/o pain in ribs and legs since last night. He states that he was doing well and had a sudden increase in his pain which he was unable to manage with his oral analgesics. He describes the pain as throbbing and intermittently sharp. He also c/o intermittent headache which is localized to the temples, intermittent and throbbing in nature. However HA no present currently. He denies adn f/c, cough, increased SOB, N/V/D.   In the ED he received 3 doses of Dilaudid and pain is still present. He also had  a CXR and it suggested a suspicion for developing RUL pneumonia. This along with an elevated WBC led to the prescription of Cefepime and Vancomycin by Dr. Rex Kras. The patient received Cefepime however the Vancomycin was discontinued   Hospital Course:  Present on Admission:  . Hb-SS disease with crisis [facility]): Pt was managed with IV Dilaudid, Toradol and IVF. As the pain improved, he was transitioned to oral analgesics. At the time of discharge his pain was 6/10. He is discharged home on MS Contin and Dilaudid for breakthrough pain.  . Anemia of Chronic Disease: Pt has a chronic anemia and has had a baseline Hb of 6. However he now is symptomatic with worsening Hypoxia when Hb drops below 7. Additionally below a Hb of 7, his leukocytosis worsens to the range of >20. This has shown a pattern of resolution to baseline of 15 when his Hb is at 7.  Recommend maintaining a HB of not less than 7. During this hospitalization his Hb was at 6.9. He was transfused 1 unit of RBC and Hb was at 7.5 at the time of discharge.  . Acute on Chronic respiratory failure with hypoxia ([place]): Pt had an increased oxygen requirement during hospitalization this resolved after transfusion. He has not been compliant with wearing his Oxygen in the ambulatory setting and as a  result his baseline oxygen requirement has increased to 4 l/min. He is being discharged home on Oxygen 4 l/min. He reports that the concentrator is malfunctioning and [place] has been notified by the CM- Marney Doctor.  . Leukocytosis: Pt had no left shift in WBC and he had no evidence of infection. He received a dose of Cefepime in the ED. However further antibiotics were discontinued and patient had resolution of WBC to baseline and was without fevers or respiratory compromise throughout his hospital stay. . 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 I recommend that he be resumed on Jadenu for chronic chelation. . 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 He is discharged home on Lovenox 120 mg daily  Disposition and Follow-up:  Pt is discharged home in good condition. He is to follow up with NP- Ms. Hollis on 03/24/2015 at [facility].      Discharge Instructions    For home use only DME oxygen    Complete by:  As directed   Mode or (Route):  Nasal cannula  Liters per Minute:  4  Frequency:  Continuous (stationary and portable oxygen unit needed)  Oxygen delivery system:  Gas           DISCHARGE EXAM:  General: Alert, awake, oriented x3, in NAD.  Vital Signs:BP 123/84, HR 72, T 98.2 F (36.8 C), temperature source Oral, RR 19, height 6' (1.829 m), weight 167 lb 1.6 oz (75.796 kg), SpO2  77% on RA, 92% on 4 l/min. HEENT: [place]/AT PEERL, EOMI, anicteric.  Neck: Trachea midline, no masses, no thyromegal,y no JVD, no carotid bruit OROPHARYNX: Moist, No exudate/ erythema/lesions.  Heart: Regular rate and rhythm, loud P2. PMI non-displaced. Exam reveals no decreased pulses. Pulmonary/Chest: Normal effort. Breath sounds normal. No. Apnea. Clear to auscultation,no stridor,  no wheezing and no rhonchi noted. No respiratory distress and no tenderness noted. Abdomen: Soft, nontender, nondistended, normal bowel sounds, no  masses no hepatosplenomegaly noted. No fluid wave and no ascites. There is no guarding or rebound. Neuro: Alert and oriented to person, place and time. Normal motor skills, Displays no atrophy or tremors and  exhibits normal muscle tone.  No focal neurological deficits noted cranial nerves II through XII grossly intact. No sensory deficit noted. Strength at functional baseline in bilateral upper and lower extremities. Gait normal. Musculoskeletal: No warm swelling or erythema around joints, no spinal tenderness noted. Psychiatric: Patient alert and oriented x3, good insight and cognition, good recent to remote recall. Mood, memory, affect and judgement normal Lymph node survey: No cervical axillary or inguinal lymphadenopathy noted. Skin: Skin is warm and dry. No bruising, no ecchymosis and no rash noted. Pt is not diaphoretic. No erythema. No pallor       Recent Labs  02/25/15 0934 02/27/15 0624  NA 142 137  K 4.2 4.5  CL 113* 109  CO2 23 21*  GLUCOSE 113* 118*  BUN 9 12  CREATININE 0.72 0.68  CALCIUM 8.8* 8.7*   No results for input(s): AST, ALT, ALKPHOS, BILITOT, PROT, ALBUMIN in the last 72 hours. No results for input(s): LIPASE, AMYLASE in the last 72 hours.  Recent Labs  02/26/15 0400 02/27/15 0624  WBC 20.6* 16.0*  NEUTROABS 13.6* 10.6*  HGB 6.9* 7.5*  HCT 21.2* 22.3*  MCV 93.4 91.0  PLT 528* 469*     Total time spent including face to face and decision making was greater than 30 minutes  Signed: Talishia Betzler A. 02/27/2015, 12:45 PM

## 2015-02-28 IMAGING — CR DG CHEST 2V
2 series · 2 of 2 positions shown · non-contrast
Comparison: Radiographs 03/28/2012.  CT 03/01/2012.

CLINICAL DATA: Chest pain.  Sickle cell crisis.

CHEST - 2 VIEW

[w chest pa]
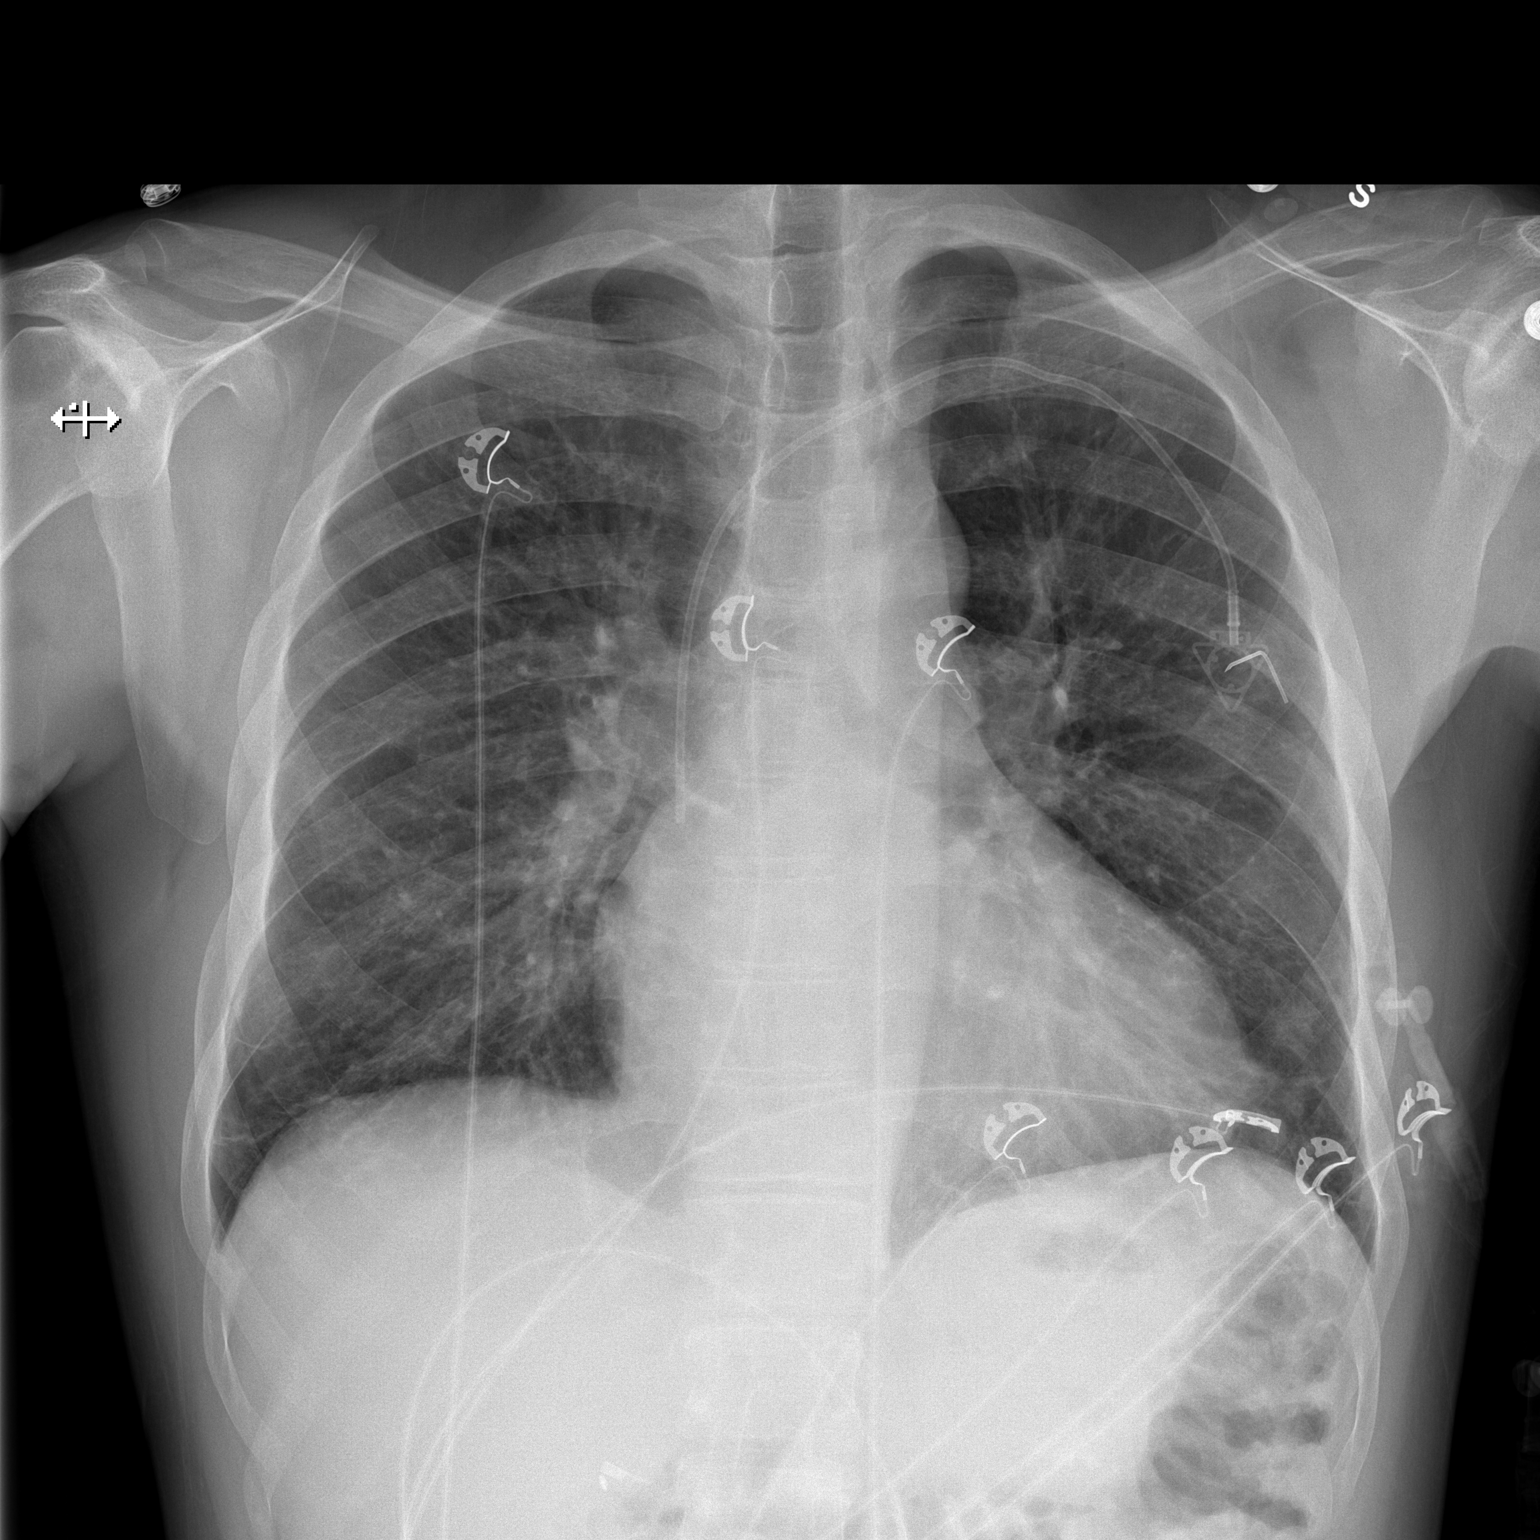

[w chest lat]
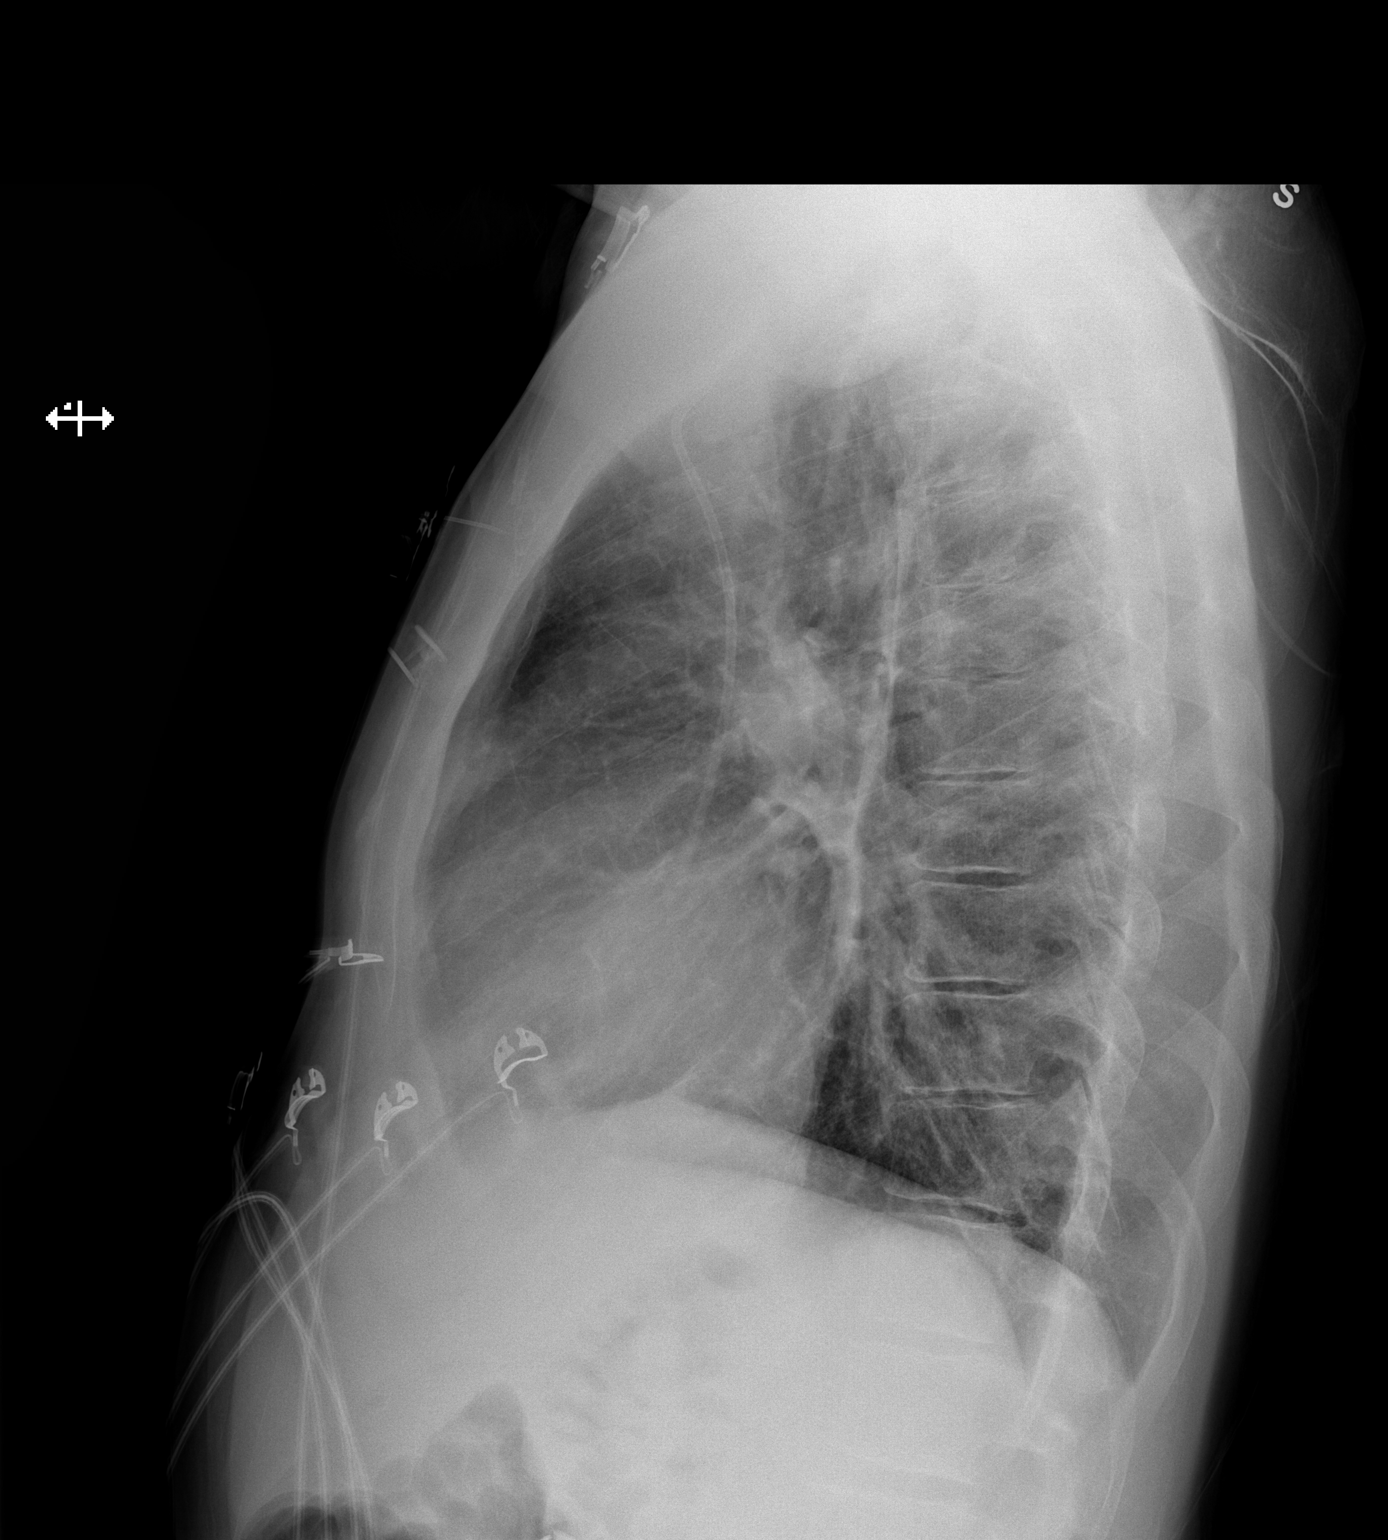

[2 of 2 positions shown; findings below may reference images not displayed]

FINDINGS: The left subclavian power port is unchanged in the lower
SVC.  Heart size and mediastinal contours are stable.  Chronic
central airway thickening at both lung bases appears stable.  There
is no superimposed airspace disease, pleural effusion or
pneumothorax.
IMPRESSION: Stable chronic lung disease.  No acute cardiopulmonary process
identified.

## 2015-03-02 ENCOUNTER — Other Ambulatory Visit: Payer: Self-pay | Admitting: Internal Medicine

## 2015-03-02 DIAGNOSIS — G894 Chronic pain syndrome: Secondary | ICD-10-CM

## 2015-03-02 DIAGNOSIS — D571 Sickle-cell disease without crisis: Secondary | ICD-10-CM

## 2015-03-02 DIAGNOSIS — G4700 Insomnia, unspecified: Secondary | ICD-10-CM

## 2015-03-02 LAB — TYPE AND SCREEN
ABO/RH(D): O POS
Antibody Screen: NEGATIVE
UNIT DIVISION: 0
Unit division: 0

## 2015-03-02 LAB — CULTURE, BLOOD (ROUTINE X 2)
CULTURE: NO GROWTH
CULTURE: NO GROWTH

## 2015-03-02 NOTE — Telephone Encounter (Signed)
Refill request for ambien 10mg  dilaudid 4mg , and ms contin 30mg . LOV 02/23/2015. Please advise. Thanks!

## 2015-03-03 MED ORDER — MORPHINE SULFATE ER 30 MG PO TBCR: 30.0000 mg | EXTENDED_RELEASE_TABLET | Freq: Two times a day (BID) | ORAL | Status: DC

## 2015-03-03 MED ORDER — ZOLPIDEM TARTRATE 10 MG PO TABS: 10.0000 mg | ORAL_TABLET | Freq: Every evening | ORAL | Status: DC | PRN

## 2015-03-03 MED ORDER — HYDROMORPHONE HCL 4 MG PO TABS: 4.0000 mg | ORAL_TABLET | ORAL | Status: DC | PRN

## 2015-03-03 NOTE — Telephone Encounter (Signed)
Reviewed [place] Substance Reporting system prior to prescribing opiate medications, no inconsistencies noted.   Meds ordered this encounter  Medications  . morphine (MS CONTIN) 30 MG 12 hr tablet    Sig: Take 1 tablet (30 mg total) by mouth every 12 (twelve) hours.    Dispense:  60 tablet    Refill:  0    Rx not to be filled prior to 03/09/2015    Order Specific Question:  Supervising Provider    Answer:  Tresa Garter [id number]  . zolpidem (AMBIEN) 10 MG tablet    Sig: Take 1 tablet (10 mg total) by mouth at bedtime as needed for sleep.    Dispense:  30 tablet    Refill:  0    Order Specific Question:  Supervising Provider    Answer:  Tresa Garter [id number]  . HYDROmorphone (DILAUDID) 4 MG tablet    Sig: Take 1 tablet (4 mg total) by mouth every 4 (four) hours as needed for severe pain.    Dispense:  90 tablet    Refill:  0    Order Specific Question:  Supervising Provider    Answer:  Tresa Garter [id number]    Dorena Dew, FNP

## 2015-03-03 NOTE — Telephone Encounter (Signed)
Patient called complaining of pain on his left side (ribs)  rated 9/10.  Patient states he took his last dose of pain medication at 4 pm yesterday because he ran out of medication.  Patient denies fever, chest pain, nausea/vomiting, diarrhea, and priapism.    Returned call to patient and informed him that [place], NP is recommending that he hydrate and come to the office to receive his refill prescription for pain medication.

## 2015-03-04 ENCOUNTER — Encounter (HOSPITAL_COMMUNITY): Payer: Self-pay

## 2015-03-04 ENCOUNTER — Emergency Department (HOSPITAL_COMMUNITY): Payer: Medicare Other

## 2015-03-04 ENCOUNTER — Emergency Department (HOSPITAL_COMMUNITY)
Admission: EM | Admit: 2015-03-04 | Discharge: 2015-03-04 | Disposition: A | Discharge status: Home / Self Care | Payer: Medicare Other | Attending: Emergency Medicine | Admitting: Emergency Medicine

## 2015-03-04 DIAGNOSIS — D5700 Hb-SS disease with crisis, unspecified: Secondary | ICD-10-CM | POA: Insufficient documentation

## 2015-03-04 DIAGNOSIS — I10 Essential (primary) hypertension: Secondary | ICD-10-CM | POA: Insufficient documentation

## 2015-03-04 DIAGNOSIS — Z86718 Personal history of other venous thrombosis and embolism: Secondary | ICD-10-CM | POA: Insufficient documentation

## 2015-03-04 DIAGNOSIS — Z8619 Personal history of other infectious and parasitic diseases: Secondary | ICD-10-CM | POA: Diagnosis not present

## 2015-03-04 DIAGNOSIS — Z87891 Personal history of nicotine dependence: Secondary | ICD-10-CM | POA: Diagnosis not present

## 2015-03-04 DIAGNOSIS — Z86711 Personal history of pulmonary embolism: Secondary | ICD-10-CM | POA: Diagnosis not present

## 2015-03-04 DIAGNOSIS — Z7982 Long term (current) use of aspirin: Secondary | ICD-10-CM | POA: Insufficient documentation

## 2015-03-04 DIAGNOSIS — J811 Chronic pulmonary edema: Secondary | ICD-10-CM | POA: Diagnosis not present

## 2015-03-04 DIAGNOSIS — Z79899 Other long term (current) drug therapy: Secondary | ICD-10-CM | POA: Diagnosis not present

## 2015-03-04 DIAGNOSIS — Q8901 Asplenia (congenital): Secondary | ICD-10-CM | POA: Diagnosis not present

## 2015-03-04 LAB — COMPREHENSIVE METABOLIC PANEL
ALBUMIN: 4.2 g/dL (ref 3.5–5.0)
ALT: 22 U/L (ref 17–63)
ANION GAP: 9 (ref 5–15)
AST: 53 U/L — ABNORMAL HIGH (ref 15–41)
Alkaline Phosphatase: 100 U/L (ref 38–126)
BUN: 14 mg/dL (ref 6–20)
CO2: 22 mmol/L (ref 22–32)
Calcium: 9 mg/dL (ref 8.9–10.3)
Chloride: 108 mmol/L (ref 101–111)
Creatinine, Ser: 0.63 mg/dL (ref 0.61–1.24)
GFR calc Af Amer: >60 mL/min (ref >60)
GFR calc non Af Amer: >60 mL/min (ref >60)
GLUCOSE: 103 mg/dL — AB (ref 65–99)
POTASSIUM: 4.4 mmol/L (ref 3.5–5.1)
SODIUM: 139 mmol/L (ref 135–145)
TOTAL PROTEIN: 7.6 g/dL (ref 6.5–8.1)
Total Bilirubin: 5.6 mg/dL — ABNORMAL HIGH (ref 0.3–1.2)

## 2015-03-04 LAB — CBC WITH DIFFERENTIAL/PLATELET
BASOS ABS: 0.1 10*3/uL (ref 0.0–0.1)
Basophils Relative: 1 %
Eosinophils Absolute: 0.4 10*3/uL (ref 0.0–0.7)
Eosinophils Relative: 3 %
HEMATOCRIT: 21.9 % — AB (ref 39.0–52.0)
Hemoglobin: 7.6 g/dL — ABNORMAL LOW (ref 13.0–17.0)
LYMPHS PCT: 17 %
Lymphs Abs: 2.3 10*3/uL (ref 0.7–4.0)
MCH: 30.5 pg (ref 26.0–34.0)
MCHC: 34.7 g/dL (ref 30.0–36.0)
MCV: 88 fL (ref 78.0–100.0)
MONO ABS: 2.3 10*3/uL — AB (ref 0.1–1.0)
Monocytes Relative: 17 %
NEUTROS ABS: 8.3 10*3/uL — AB (ref 1.7–7.7)
Neutrophils Relative %: 62 %
Platelets: 465 10*3/uL — ABNORMAL HIGH (ref 150–400)
RBC: 2.49 MIL/uL — ABNORMAL LOW (ref 4.22–5.81)
RDW: 20.8 % — AB (ref 11.5–15.5)
WBC: 13.3 10*3/uL — ABNORMAL HIGH (ref 4.0–10.5)

## 2015-03-04 LAB — RETICULOCYTES
RBC.: 2.49 MIL/uL — ABNORMAL LOW (ref 4.22–5.81)
RETIC COUNT ABSOLUTE: 154.4 10*3/uL (ref 19.0–186.0)
Retic Ct Pct: 6.2 % — ABNORMAL HIGH (ref 0.4–3.1)

## 2015-03-04 MED ORDER — HYDROMORPHONE HCL 2 MG/ML IJ SOLN: 0.0250 mg/kg | INTRAMUSCULAR | Status: AC

## 2015-03-04 MED ORDER — HEPARIN SOD (PORK) LOCK FLUSH 100 UNIT/ML IV SOLN
500.0000 [IU] | Freq: Once | INTRAVENOUS | Status: DC
  Filled 2015-03-04: qty 5

## 2015-03-04 MED ORDER — HYDROMORPHONE HCL 2 MG/ML IJ SOLN
0.0250 mg/kg | INTRAMUSCULAR | Status: AC
  Administered 2015-03-04: 1.9 mg via INTRAVENOUS
  Filled 2015-03-04: qty 1

## 2015-03-04 MED ORDER — SODIUM CHLORIDE 0.9 % IV SOLN
INTRAVENOUS | Status: DC
  Administered 2015-03-04: 10:00:00 via INTRAVENOUS

## 2015-03-04 MED ORDER — HYDROMORPHONE HCL 2 MG/ML IJ SOLN
0.0313 mg/kg | INTRAMUSCULAR | Status: AC
  Administered 2015-03-04: 2.4 mg via INTRAVENOUS
  Filled 2015-03-04: qty 2

## 2015-03-04 MED ORDER — HYDROMORPHONE HCL 2 MG/ML IJ SOLN: 0.0375 mg/kg | INTRAMUSCULAR | Status: AC

## 2015-03-04 MED ORDER — ONDANSETRON HCL 4 MG/2ML IJ SOLN
4.0000 mg | Freq: Once | INTRAMUSCULAR | Status: AC
  Administered 2015-03-04: 4 mg via INTRAVENOUS
  Filled 2015-03-04: qty 2

## 2015-03-04 MED ORDER — HYDROMORPHONE HCL 2 MG/ML IJ SOLN
0.0375 mg/kg | INTRAMUSCULAR | Status: AC
  Administered 2015-03-04: 2.8 mg via INTRAVENOUS
  Filled 2015-03-04: qty 2

## 2015-03-04 MED ORDER — HYDROMORPHONE HCL 2 MG/ML IJ SOLN: 0.0313 mg/kg | INTRAMUSCULAR | Status: AC

## 2015-03-04 NOTE — ED Notes (Signed)
Will get EKG after X-ray

## 2015-03-04 NOTE — ED Notes (Signed)
Pt c/o intermittent L ribcage pain r/t Sickle Cell Crisis x 8 days.  Pain score 9/10.  Pt reports taking all medications as prescribed w/o relief.

## 2015-03-04 NOTE — ED Provider Notes (Signed)
CSN: [id number]     Arrival date & time 03/04/15  0844 History   First MD Initiated Contact with Patient 03/04/15 0900     Chief Complaint  Patient presents with  . Sickle Cell Pain Crisis  . Ribcage Pain      (Consider location/radiation/quality/duration/timing/severity/associated sxs/prior Treatment) HPI  [name] is a 35 y.o. male presents for evaluation of "left side pain", indicated as his left lower ribs, which has been present for 1 week. He was hospitalized, for sickle cell crisis, and discharged 5 days ago. He is taking his usual at-home narcotic medicine, Dilaudid, without relief. He has a persistent cough which is improving, with now decreased sputum production. He is being actively treated for pneumonia. He denies shortness of breath. He presents on room air, with normal oxygen saturation of 97%. He denies recent fever, chills, nausea, vomiting, weakness or dizziness. There are no other known modifying factors.   Past Medical History  Diagnosis Date  . Sickle cell anemia (HCC)   . Blood transfusion   . Acute embolism and thrombosis of right internal jugular vein ([place])   . Hypokalemia   . Mood disorder ([place])   . History of pulmonary embolus (PE)   . Avascular necrosis (HCC)     Right Hip  . Leukocytosis     Chronic  . Thrombocytosis (HCC)     Chronic  . Hypertension   . History of Clostridium difficile infection   . Uses marijuana   . Chronic anticoagulation   . Functional asplenia   . Former smoker   . Second hand tobacco smoke exposure   . Alcohol consumption of one to four drinks per day   . Noncompliance with medication regimen   . Sickle-cell crisis with associated acute chest syndrome ([place]) 05/13/2013  . Acute chest syndrome ([place]) 06/18/2013  . Demand ischemia ([place]) 01/02/2014  . Pulmonary hypertension [facility])    Past Surgical History  Procedure Laterality Date  . Right hip replacement      08/2006  . Cholecystectomy      01/2008  . Porta cath  placement    . Porta cath removal    . Umbilical hernia repair      01/2008  . Excision of left periauricular cyst      10/2009  . Excision of right ear lobe cyst with primary closur      11/2007  . Portacath placement  01/05/2012    Procedure: INSERTION PORT-A-CATH;  Surgeon: Odis Hollingshead, MD;  Location: [place];  Service: General;  Laterality: N/A;  ultrasound guiced port a cath insertion with fluoroscopy   Family History  Problem Relation Age of Onset  . Sickle cell trait Mother   . Depression Mother   . Diabetes Mother   . Sickle cell trait Father   . Sickle cell trait Brother    Social History  Substance Use Topics  . Smoking status: Former Smoker -- 13 years  . Smokeless tobacco: Never Used  . Alcohol Use: No    Review of Systems  All other systems reviewed and are negative.     Allergies  Review of patient's allergies indicates no known allergies.  Home Medications   Prior to Admission medications   Medication Sig Start Date End Date Taking? Authorizing Provider  aspirin 81 MG chewable tablet Chew 1 tablet (81 mg total) by mouth daily. 07/23/14  Yes [name], MD  Cholecalciferol (VITAMIN D) 2000 units tablet Take 1 tablet (2,000 Units  total) by mouth daily. 02/23/15  Yes Dorena Dew, FNP  cyclobenzaprine (FLEXERIL) 5 MG tablet Take 1 tablet (5 mg total) by mouth 3 (three) times daily as needed for muscle spasms. 02/19/15  Yes Alfonzo Beers, MD  enoxaparin (LOVENOX) 120 MG/0.8ML injection Inject 0.8 mLs (120 mg total) into the skin daily. 12/03/14  Yes [name], MD  folic acid (FOLVITE) 1 MG tablet Take 1 tablet (1 mg total) by mouth every morning. 02/23/15  Yes Dorena Dew, FNP  HYDROmorphone (DILAUDID) 4 MG tablet Take 1 tablet (4 mg total) by mouth every 4 (four) hours as needed for severe pain. 03/03/15  Yes Dorena Dew, FNP  hydroxyurea (HYDREA) 500 MG capsule Take 1 capsule (500 mg total) by mouth daily. May take with food to  minimize GI side effects. 11/06/14  Yes Dorena Dew, FNP  lisinopril (PRINIVIL,ZESTRIL) 10 MG tablet Take 1 tablet (10 mg total) by mouth daily. 01/22/14  Yes [name], MD  metoprolol succinate (TOPROL-XL) 50 MG 24 hr tablet Take 1 tablet (50 mg total) by mouth daily. 02/23/15  Yes Dorena Dew, FNP  morphine (MS CONTIN) 30 MG 12 hr tablet Take 1 tablet (30 mg total) by mouth every 12 (twelve) hours. 03/03/15  Yes Dorena Dew, FNP  potassium chloride SA (K-DUR,KLOR-CON) 20 MEQ tablet Take 1 tablet (20 mEq total) by mouth every morning. 02/23/15  Yes Dorena Dew, FNP  zolpidem (AMBIEN) 10 MG tablet Take 1 tablet (10 mg total) by mouth at bedtime as needed for sleep. 03/03/15  Yes Dorena Dew, FNP   BP 108/75 mmHg  Pulse 86  Temp(Src) 98 F (36.7 C) (Oral)  Resp 20  SpO2 96% Physical Exam  Constitutional: He is oriented to person, place, and time. He appears well-developed and well-nourished.  HENT:  Head: Normocephalic and atraumatic.  Right Ear: External ear normal.  Left Ear: External ear normal.  Eyes: Conjunctivae and EOM are normal. Pupils are equal, round, and reactive to light.  Neck: Normal range of motion and phonation normal. Neck supple.  Cardiovascular: Normal rate, regular rhythm and normal heart sounds.   Pulmonary/Chest: Effort normal and breath sounds normal. He exhibits no tenderness (There is no palpable chest wall tenderness, or deformity.) and no bony tenderness.  Port-A-Cath site left upper chest wall appears normal.  Abdominal: Soft. There is no tenderness.  Musculoskeletal: Normal range of motion. He exhibits no edema.  Neurological: He is alert and oriented to person, place, and time. No cranial nerve deficit or sensory deficit. He exhibits normal muscle tone. Coordination normal.  Skin: Skin is warm, dry and intact.  Psychiatric: He has a normal mood and affect. His behavior is normal. Judgment and thought content normal.  Nursing  note and vitals reviewed.   ED Course  Procedures (including critical care time)  Medications  0.9 %  sodium chloride infusion ( Intravenous New Bag/Given 03/04/15 1010)  ondansetron (ZOFRAN) injection 4 mg (4 mg Intravenous Given 03/04/15 1010)  HYDROmorphone (DILAUDID) injection 1.9 mg (1.9 mg Intravenous Given 03/04/15 1025)    Or  HYDROmorphone (DILAUDID) injection 1.9 mg ( Subcutaneous See Alternative 03/04/15 1025)  HYDROmorphone (DILAUDID) injection 2.4 mg (2.4 mg Intravenous Given 03/04/15 1052)    Or  HYDROmorphone (DILAUDID) injection 2.4 mg ( Subcutaneous See Alternative 03/04/15 1052)  HYDROmorphone (DILAUDID) injection 2.8 mg (2.8 mg Intravenous Given 03/04/15 1116)    Or  HYDROmorphone (DILAUDID) injection 2.8 mg ( Subcutaneous See Alternative 03/04/15  1116)    Patient Vitals for the past 24 hrs:  BP Temp Temp src Pulse Resp SpO2  03/04/15 1107 - - - - - 96 %  03/04/15 1043 108/75 mmHg 98 F (36.7 C) Oral 86 20 (!) 83 %  03/04/15 1030 96/61 mmHg - - 86 18 (!) 85 %  03/04/15 1000 102/76 mmHg - - 84 22 90 %  03/04/15 0850 126/93 mmHg 98.3 F (36.8 C) Oral 103 16 97 %    12:24 PM Reevaluation with update and discussion. After initial assessment and treatment, an updated evaluation reveals he states that his pain has resolved, to its baseline. Findings discussed with patient, all questions were answered. Pearline Yerby L     Labs Review Labs Reviewed  COMPREHENSIVE METABOLIC PANEL - Abnormal; Notable for the following:    Glucose, Bld 103 (*)    AST 53 (*)    Total Bilirubin 5.6 (*)    All other components within normal limits  CBC WITH DIFFERENTIAL/PLATELET - Abnormal; Notable for the following:    WBC 13.3 (*)    RBC 2.49 (*)    Hemoglobin 7.6 (*)    HCT 21.9 (*)    RDW 20.8 (*)    Platelets 465 (*)    Neutro Abs 8.3 (*)    Monocytes Absolute 2.3 (*)    All other components within normal limits  RETICULOCYTES - Abnormal; Notable for the following:    Retic Ct Pct 6.2  (*)    RBC. 2.49 (*)    All other components within normal limits    Imaging Review Dg Chest 2 View  03/04/2015  CLINICAL DATA:  Sickle cell pain, crisis, left-sided chest pain EXAM: CHEST  2 VIEW COMPARISON:  02/25/2015 FINDINGS: There is no focal consolidation. There is mild bilateral interstitial thickening. There is no pleural effusion or pneumothorax. There is stable cardiomegaly. There is a left-sided Port-A-Cath in satisfactory position. The osseous structures are unremarkable. IMPRESSION: Cardiomegaly with mild pulmonary vascular congestion. Electronically Signed   By: Kathreen Devoid   On: 03/04/2015 09:46   I have personally reviewed and evaluated these images and lab results as part of my medical decision-making.   EKG Interpretation   Date/Time:  Wednesday March 04 2015 09:39:05 EST Ventricular Rate:  88 PR Interval:  165 QRS Duration: 113 QT Interval:  392 QTC Calculation: 474 R Axis:   -103 Text Interpretation:  Sinus rhythm Probable left atrial enlargement RVH  with secondary repolarization abnrm Inferior infarct, old Abnormal lateral  Q waves since last tracing no significant change Confirmed by Aundraya Dripps  MD,  Namir Neto [id number]) on 03/04/2015 11:22:05 AM      MDM   Final diagnoses:  Sickle cell crisis ([place])    Pain left chest wall, possibly related to sickle cell crisis. Hemoglobin is stable and he appears to have a normal , mildly elevated , reticulocyte count. No recurrent pneumonia in chest x-ray today.  Doubt aplastic crisis, serious bacterial infection or metabolic instability.  Nursing Notes Reviewed/ Care Coordinated, and agree without changes. Applicable Imaging Reviewed.  Interpretation of Laboratory Data incorporated into ED treatment   Nursing Notes Reviewed/ Care Coordinated Applicable Imaging Reviewed Interpretation of Laboratory Data incorporated into ED treatment  The patient appears reasonably screened and/or stabilized for discharge and I doubt any  other medical condition or other [facility] requiring further screening, evaluation, or treatment in the ED at this time prior to discharge.  Plan: Home Medications- usual; Home Treatments- rest; return here if the  recommended treatment, does not improve the symptoms; Recommended follow up- PCP prn   Daleen Bo, MD 03/04/15 1226

## 2015-03-04 NOTE — Discharge Instructions (Signed)

## 2015-03-06 ENCOUNTER — Emergency Department (HOSPITAL_COMMUNITY)
Admission: EM | Admit: 2015-03-06 | Discharge: 2015-03-06 | Disposition: A | Discharge status: Home / Self Care | Payer: Medicare Other | Attending: Emergency Medicine | Admitting: Emergency Medicine

## 2015-03-06 ENCOUNTER — Encounter (HOSPITAL_COMMUNITY): Payer: Self-pay | Admitting: Emergency Medicine

## 2015-03-06 ENCOUNTER — Telehealth (HOSPITAL_COMMUNITY): Payer: Self-pay | Admitting: *Deleted

## 2015-03-06 DIAGNOSIS — Z79899 Other long term (current) drug therapy: Secondary | ICD-10-CM | POA: Diagnosis not present

## 2015-03-06 DIAGNOSIS — Z8659 Personal history of other mental and behavioral disorders: Secondary | ICD-10-CM | POA: Insufficient documentation

## 2015-03-06 DIAGNOSIS — Z86718 Personal history of other venous thrombosis and embolism: Secondary | ICD-10-CM | POA: Diagnosis not present

## 2015-03-06 DIAGNOSIS — M79605 Pain in left leg: Secondary | ICD-10-CM | POA: Diagnosis not present

## 2015-03-06 DIAGNOSIS — Z86711 Personal history of pulmonary embolism: Secondary | ICD-10-CM | POA: Diagnosis not present

## 2015-03-06 DIAGNOSIS — Z9119 Patient's noncompliance with other medical treatment and regimen: Secondary | ICD-10-CM | POA: Diagnosis not present

## 2015-03-06 DIAGNOSIS — I10 Essential (primary) hypertension: Secondary | ICD-10-CM | POA: Diagnosis not present

## 2015-03-06 DIAGNOSIS — D571 Sickle-cell disease without crisis: Secondary | ICD-10-CM | POA: Insufficient documentation

## 2015-03-06 DIAGNOSIS — Q8901 Asplenia (congenital): Secondary | ICD-10-CM | POA: Diagnosis not present

## 2015-03-06 DIAGNOSIS — Z8619 Personal history of other infectious and parasitic diseases: Secondary | ICD-10-CM | POA: Diagnosis not present

## 2015-03-06 DIAGNOSIS — Z87891 Personal history of nicotine dependence: Secondary | ICD-10-CM | POA: Insufficient documentation

## 2015-03-06 DIAGNOSIS — E876 Hypokalemia: Secondary | ICD-10-CM | POA: Diagnosis not present

## 2015-03-06 DIAGNOSIS — Z7982 Long term (current) use of aspirin: Secondary | ICD-10-CM | POA: Insufficient documentation

## 2015-03-06 DIAGNOSIS — Z7901 Long term (current) use of anticoagulants: Secondary | ICD-10-CM | POA: Diagnosis not present

## 2015-03-06 LAB — BASIC METABOLIC PANEL
Anion gap: 9 (ref 5–15)
BUN: 10 mg/dL (ref 6–20)
CALCIUM: 9.7 mg/dL (ref 8.9–10.3)
CO2: 23 mmol/L (ref 22–32)
Chloride: 109 mmol/L (ref 101–111)
Creatinine, Ser: 0.67 mg/dL (ref 0.61–1.24)
GFR calc Af Amer: >60 mL/min (ref >60)
GLUCOSE: 93 mg/dL (ref 65–99)
Potassium: 4.3 mmol/L (ref 3.5–5.1)
Sodium: 141 mmol/L (ref 135–145)

## 2015-03-06 LAB — CBC WITH DIFFERENTIAL/PLATELET
Basophils Absolute: 0.2 10*3/uL — ABNORMAL HIGH (ref 0.0–0.1)
Basophils Relative: 1 %
EOS ABS: 0.4 10*3/uL (ref 0.0–0.7)
Eosinophils Relative: 2 %
HEMATOCRIT: 22.3 % — AB (ref 39.0–52.0)
Hemoglobin: 7.4 g/dL — ABNORMAL LOW (ref 13.0–17.0)
Lymphocytes Relative: 13 %
Lymphs Abs: 2.6 10*3/uL (ref 0.7–4.0)
MCH: 29.5 pg (ref 26.0–34.0)
MCHC: 33.2 g/dL (ref 30.0–36.0)
MCV: 88.8 fL (ref 78.0–100.0)
MONO ABS: 2 10*3/uL — AB (ref 0.1–1.0)
MONOS PCT: 10 %
NEUTROS ABS: 14.7 10*3/uL — AB (ref 1.7–7.7)
NRBC: 2 /100{WBCs} — AB
Neutrophils Relative %: 74 %
PLATELETS: 516 10*3/uL — AB (ref 150–400)
RBC: 2.51 MIL/uL — ABNORMAL LOW (ref 4.22–5.81)
RDW: 21.9 % — ABNORMAL HIGH (ref 11.5–15.5)
WBC: 19.9 10*3/uL — ABNORMAL HIGH (ref 4.0–10.5)

## 2015-03-06 LAB — RETICULOCYTES
RBC.: 2.51 MIL/uL — ABNORMAL LOW (ref 4.22–5.81)
Retic Count, Absolute: 331.3 10*3/uL — ABNORMAL HIGH (ref 19.0–186.0)
Retic Ct Pct: 13.2 % — ABNORMAL HIGH (ref 0.4–3.1)

## 2015-03-06 MED ORDER — HYDROMORPHONE HCL 1 MG/ML IJ SOLN: 1.0000 mg | Freq: Once | INTRAMUSCULAR | Status: DC

## 2015-03-06 MED ORDER — HYDROMORPHONE HCL 2 MG PO TABS
4.0000 mg | ORAL_TABLET | Freq: Once | ORAL | Status: AC
Start: 2015-03-06 — End: 2015-03-06
  Administered 2015-03-06: 4 mg via ORAL
  Filled 2015-03-06: qty 2

## 2015-03-06 MED ORDER — HYDROMORPHONE HCL 1 MG/ML IJ SOLN
1.0000 mg | Freq: Once | INTRAMUSCULAR | Status: AC
Start: 2015-03-06 — End: 2015-03-06
  Administered 2015-03-06: 1 mg via INTRAMUSCULAR
  Filled 2015-03-06: qty 1

## 2015-03-06 MED ORDER — MORPHINE SULFATE ER 30 MG PO TBCR
30.0000 mg | EXTENDED_RELEASE_TABLET | Freq: Once | ORAL | Status: AC
Start: 2015-03-06 — End: 2015-03-06
  Administered 2015-03-06: 30 mg via ORAL
  Filled 2015-03-06: qty 2

## 2015-03-06 MED ORDER — HYDROMORPHONE HCL 2 MG PO TABS: 2.0000 mg | ORAL_TABLET | Freq: Once | ORAL | Status: DC

## 2015-03-06 MED ORDER — HYDROMORPHONE HCL 2 MG/ML IJ SOLN: 2.0000 mg | Freq: Once | INTRAMUSCULAR | Status: DC

## 2015-03-06 NOTE — Telephone Encounter (Signed)
[facility] complaining of left leg pain 8-9/10. Denies chest pain, fever, N/V, Diarrhea, abdominal pain and priapism. States that he is out of pain medication and the last pain medication he took was Dilaudid 4 mg.Marland KitchenMarland KitchenWed. In the morning. He says he can't get his medication filled until tomorrow at the pharmacy because of his insurance. When asked about why he was out of medication he said he guessed he was taking more than was prescribed. Pt. Verbalized understanding that I will talk with the medical provider and give him a call back.

## 2015-03-06 NOTE — Telephone Encounter (Signed)
Called patient back after speaking with L. Hollis NP about patient's complaint of pain. Explained instructions to patient that he has prescriptions available in the office to pick up today and  to please take them as prescribed. Instructed to take pain medications as prescribed, hydrate well. Patient's voice elevated..explaining that the insurance won't pay for the medication until tomorrow. Click and dial tone noted.

## 2015-03-06 NOTE — ED Provider Notes (Signed)
CSN: [id number]     Arrival date & time 03/06/15  [id number] History   First MD Initiated Contact with Patient 03/06/15 1007     Chief Complaint  Patient presents with  . Leg Pain     (Consider location/radiation/quality/duration/timing/severity/associated sxs/prior Treatment) HPI Comments: 35 year old male with extensive past medical history outlined below including sickle cell anemia who presents with left leg pain. Patient reports that he has had left leg pain for the past few days. He initially reported 3 days but when I asked him about his recent pain crisis visit 2 days ago, he later stated that it began after he was discharged from the ED 2 days ago. The pain is throughout his left leg and he has had this pain before with a pain crisis. He denies any trauma, swelling, or skin changes. No fevers, vomiting, or recent illness. He denies any chest pain or change in his chronic shortness of breath. He is normally on 4 L oxygen at home.   Patient later notes that he ran out of his home pain medications.  Patient is a 35 y.o. male presenting with leg pain. The history is provided by the patient.  Leg Pain   Past Medical History  Diagnosis Date  . Sickle cell anemia (HCC)   . Blood transfusion   . Acute embolism and thrombosis of right internal jugular vein ([place])   . Hypokalemia   . Mood disorder ([place])   . History of pulmonary embolus (PE)   . Avascular necrosis (HCC)     Right Hip  . Leukocytosis     Chronic  . Thrombocytosis (HCC)     Chronic  . Hypertension   . History of Clostridium difficile infection   . Uses marijuana   . Chronic anticoagulation   . Functional asplenia   . Former smoker   . Second hand tobacco smoke exposure   . Alcohol consumption of one to four drinks per day   . Noncompliance with medication regimen   . Sickle-cell crisis with associated acute chest syndrome ([place]) 05/13/2013  . Acute chest syndrome ([place]) 06/18/2013  . Demand ischemia ([place]) 01/02/2014  .  Pulmonary hypertension [facility])    Past Surgical History  Procedure Laterality Date  . Right hip replacement      08/2006  . Cholecystectomy      01/2008  . Porta cath placement    . Porta cath removal    . Umbilical hernia repair      01/2008  . Excision of left periauricular cyst      10/2009  . Excision of right ear lobe cyst with primary closur      11/2007  . Portacath placement  01/05/2012    Procedure: INSERTION PORT-A-CATH;  Surgeon: Odis Hollingshead, MD;  Location: [place];  Service: General;  Laterality: N/A;  ultrasound guiced port a cath insertion with fluoroscopy   Family History  Problem Relation Age of Onset  . Sickle cell trait Mother   . Depression Mother   . Diabetes Mother   . Sickle cell trait Father   . Sickle cell trait Brother    Social History  Substance Use Topics  . Smoking status: Former Smoker -- 13 years  . Smokeless tobacco: Never Used  . Alcohol Use: No    Review of Systems 10 Systems reviewed and are negative for acute change except as noted in the HPI.    Allergies  Review of patient's allergies indicates no known allergies.  Home  Medications   Prior to Admission medications   Medication Sig Start Date End Date Taking? Authorizing Provider  aspirin 81 MG chewable tablet Chew 1 tablet (81 mg total) by mouth daily. 07/23/14  Yes [name], MD  Cholecalciferol (VITAMIN D) 2000 units tablet Take 1 tablet (2,000 Units total) by mouth daily. 02/23/15  Yes Dorena Dew, FNP  enoxaparin (LOVENOX) 120 MG/0.8ML injection Inject 0.8 mLs (120 mg total) into the skin daily. 12/03/14  Yes [name], MD  folic acid (FOLVITE) 1 MG tablet Take 1 tablet (1 mg total) by mouth every morning. 02/23/15  Yes Dorena Dew, FNP  HYDROmorphone (DILAUDID) 4 MG tablet Take 1 tablet (4 mg total) by mouth every 4 (four) hours as needed for severe pain. 03/03/15  Yes Dorena Dew, FNP  hydroxyurea (HYDREA) 500 MG capsule Take 1 capsule (500 mg  total) by mouth daily. May take with food to minimize GI side effects. 11/06/14  Yes Dorena Dew, FNP  lisinopril (PRINIVIL,ZESTRIL) 10 MG tablet Take 1 tablet (10 mg total) by mouth daily. 01/22/14  Yes [name], MD  metoprolol succinate (TOPROL-XL) 50 MG 24 hr tablet Take 1 tablet (50 mg total) by mouth daily. 02/23/15  Yes Dorena Dew, FNP  morphine (MS CONTIN) 30 MG 12 hr tablet Take 1 tablet (30 mg total) by mouth every 12 (twelve) hours. 03/03/15  Yes Dorena Dew, FNP  potassium chloride SA (K-DUR,KLOR-CON) 20 MEQ tablet Take 1 tablet (20 mEq total) by mouth every morning. 02/23/15  Yes Dorena Dew, FNP  zolpidem (AMBIEN) 10 MG tablet Take 1 tablet (10 mg total) by mouth at bedtime as needed for sleep. 03/03/15  Yes Dorena Dew, FNP  cyclobenzaprine (FLEXERIL) 5 MG tablet Take 1 tablet (5 mg total) by mouth 3 (three) times daily as needed for muscle spasms. Patient not taking: Reported on 03/06/2015 02/19/15   Alfonzo Beers, MD   BP 120/88 mmHg  Pulse 69  Temp(Src) 98.6 F (37 C) (Oral)  Resp 18  SpO2 100% Physical Exam  Constitutional: He is oriented to person, place, and time. He appears well-developed and well-nourished. No distress.  Chronically ill-appearing, rubbing and moving L leg  HENT:  Head: Normocephalic and atraumatic.  Moist mucous membranes  Eyes: Pupils are equal, round, and reactive to light. Scleral icterus is present.  Neck: Neck supple.  Cardiovascular: Normal rate, regular rhythm and normal heart sounds.   No murmur heard. Pulmonary/Chest: Effort normal.  Course breath sounds with diminished breath sounds in bases bilaterally, on O2 [place]  Abdominal: Soft. Bowel sounds are normal. He exhibits no distension. There is no tenderness.  Musculoskeletal: He exhibits no edema.  No focal tenderness to palpation of left leg, 2+ DP pulse, normal sensation throughout, normal range of motion at knee, hip, and ankle  Neurological: He is alert and  oriented to person, place, and time.  Fluent speech  Skin: Skin is warm and dry. No rash noted. No erythema.  Psychiatric:  Flat affect  Nursing note and vitals reviewed.   ED Course  Procedures (including critical care time) Labs Review Labs Reviewed  CBC WITH DIFFERENTIAL/PLATELET - Abnormal; Notable for the following:    WBC 19.9 (*)    RBC 2.51 (*)    Hemoglobin 7.4 (*)    HCT 22.3 (*)    RDW 21.9 (*)    Platelets 516 (*)    nRBC 2 (*)    Neutro Abs 14.7 (*)  Monocytes Absolute 2.0 (*)    Basophils Absolute 0.2 (*)    All other components within normal limits  RETICULOCYTES - Abnormal; Notable for the following:    Retic Ct Pct 13.2 (*)    RBC. 2.51 (*)    Retic Count, Manual 331.3 (*)    All other components within normal limits  BASIC METABOLIC PANEL    Imaging Review No results found. I have personally reviewed and evaluated these  lab results as part of my medical decision-making.   EKG Interpretation None     Medications  HYDROmorphone (DILAUDID) tablet 2 mg (not administered)  HYDROmorphone (DILAUDID) tablet 4 mg (4 mg Oral Given 03/06/15 1107)  HYDROmorphone (DILAUDID) injection 1 mg (1 mg Intramuscular Given 03/06/15 1107)  morphine (MS CONTIN) 12 hr tablet 30 mg (30 mg Oral Given 03/06/15 1158)    MDM   Final diagnoses:  Left leg pain   Pt w/ sickle cell anemia p/w a few days of atraumatic left leg pain without any edema or skin changes. Patient does have history of PE but states that he has been on Lovenox and is compliant. He also states that he has had this leg pain before with pain crisis. On exam, he was nontoxic and in no acute distress. Vital signs unremarkable. No skin changes or swelling noted on physical exam and he had normal distal pulses. Patient later noted that he had run out of his home pain medication. His chart documents nursing conversations that he has prescriptions available but he later noted that his insurance will not pay for it  until tomorrow, likely because he has used up his narcotics too soon. Provided patient with pain medications in the ED and discussed with pain clinic NP Smith Robert, who instructed to pick up the prescriptions that are waiting for him at the pharmacy. PT has been comfortable on multiple reevaluations and his labs are reassuring against acute process. Patient discharged in satisfactory condition.   Sharlett Iles, MD 03/06/15 1340

## 2015-03-06 NOTE — ED Notes (Addendum)
Pt reports sickle cell pain in L leg for the past day. Pt states he has used home medications for this. Pt supposed to be on 4L [place], but did not bring home O2 tank upon arrival to ED. Placed back on 4L [place] on arrival.  Mentioned pt's call to sickle cell center and medications from sickle cell center, but pt said he could not pick up medications from pharmacy.

## 2015-03-06 NOTE — ED Notes (Signed)
Went to d/c pt, but pt was already gone.

## 2015-03-06 NOTE — ED Notes (Signed)
Lab delay - pt would like port accessed.  

## 2015-03-10 ENCOUNTER — Telehealth: Payer: Self-pay

## 2015-03-10 ENCOUNTER — Other Ambulatory Visit: Payer: Self-pay | Admitting: Family Medicine

## 2015-03-10 DIAGNOSIS — D571 Sickle-cell disease without crisis: Secondary | ICD-10-CM

## 2015-03-10 NOTE — Telephone Encounter (Signed)
[place], Margrette from the sickle cell agency is saying that [name] needs a new referral for hematology to [facility]. Can you place an order for him for this? Please advise. Thanks!

## 2015-03-11 ENCOUNTER — Encounter (HOSPITAL_COMMUNITY): Payer: Self-pay | Admitting: *Deleted

## 2015-03-11 ENCOUNTER — Non-Acute Institutional Stay (HOSPITAL_COMMUNITY)
Admission: AD | Admit: 2015-03-11 | Discharge: 2015-03-11 | Disposition: A | Discharge status: Home / Self Care | Payer: Medicare Other | Attending: Internal Medicine | Admitting: Internal Medicine

## 2015-03-11 ENCOUNTER — Telehealth (HOSPITAL_COMMUNITY): Payer: Self-pay | Admitting: *Deleted

## 2015-03-11 DIAGNOSIS — Z86718 Personal history of other venous thrombosis and embolism: Secondary | ICD-10-CM | POA: Diagnosis not present

## 2015-03-11 DIAGNOSIS — I10 Essential (primary) hypertension: Secondary | ICD-10-CM | POA: Diagnosis not present

## 2015-03-11 DIAGNOSIS — Z86711 Personal history of pulmonary embolism: Secondary | ICD-10-CM | POA: Insufficient documentation

## 2015-03-11 DIAGNOSIS — Z9049 Acquired absence of other specified parts of digestive tract: Secondary | ICD-10-CM | POA: Insufficient documentation

## 2015-03-11 DIAGNOSIS — Z96641 Presence of right artificial hip joint: Secondary | ICD-10-CM | POA: Diagnosis not present

## 2015-03-11 DIAGNOSIS — D5700 Hb-SS disease with crisis, unspecified: Secondary | ICD-10-CM | POA: Diagnosis present

## 2015-03-11 DIAGNOSIS — I272 Other secondary pulmonary hypertension: Secondary | ICD-10-CM | POA: Insufficient documentation

## 2015-03-11 DIAGNOSIS — Z87891 Personal history of nicotine dependence: Secondary | ICD-10-CM | POA: Diagnosis not present

## 2015-03-11 DIAGNOSIS — D473 Essential (hemorrhagic) thrombocythemia: Secondary | ICD-10-CM | POA: Insufficient documentation

## 2015-03-11 DIAGNOSIS — M79606 Pain in leg, unspecified: Secondary | ICD-10-CM | POA: Diagnosis present

## 2015-03-11 DIAGNOSIS — D72829 Elevated white blood cell count, unspecified: Secondary | ICD-10-CM | POA: Diagnosis not present

## 2015-03-11 LAB — CBC WITH DIFFERENTIAL/PLATELET
BASOS PCT: 1 %
Basophils Absolute: 0.1 10*3/uL (ref 0.0–0.1)
EOS ABS: 0.4 10*3/uL (ref 0.0–0.7)
Eosinophils Relative: 3 %
HEMATOCRIT: 21.6 % — AB (ref 39.0–52.0)
Hemoglobin: 7 g/dL — ABNORMAL LOW (ref 13.0–17.0)
Lymphocytes Relative: 11 %
Lymphs Abs: 1.5 10*3/uL (ref 0.7–4.0)
MCH: 29.8 pg (ref 26.0–34.0)
MCHC: 32.4 g/dL (ref 30.0–36.0)
MCV: 91.9 fL (ref 78.0–100.0)
MONO ABS: 2.3 10*3/uL — AB (ref 0.1–1.0)
Monocytes Relative: 17 %
NEUTROS ABS: 9.1 10*3/uL — AB (ref 1.7–7.7)
Neutrophils Relative %: 68 %
Platelets: 530 10*3/uL — ABNORMAL HIGH (ref 150–400)
RBC: 2.35 MIL/uL — ABNORMAL LOW (ref 4.22–5.81)
RDW: 21.8 % — AB (ref 11.5–15.5)
WBC: 13.4 10*3/uL — ABNORMAL HIGH (ref 4.0–10.5)

## 2015-03-11 LAB — LACTATE DEHYDROGENASE: LDH: 414 U/L — ABNORMAL HIGH (ref 98–192)

## 2015-03-11 MED ORDER — DEXTROSE-NACL 5-0.45 % IV SOLN
INTRAVENOUS | Status: DC
  Administered 2015-03-11: 11:00:00 via INTRAVENOUS

## 2015-03-11 MED ORDER — SODIUM CHLORIDE 0.9% FLUSH
10.0000 mL | INTRAVENOUS | Status: DC | PRN
Start: 2015-03-11 — End: 2015-03-11

## 2015-03-11 MED ORDER — NALOXONE HCL 0.4 MG/ML IJ SOLN
0.4000 mg | INTRAMUSCULAR | Status: DC | PRN
Start: 2015-03-11 — End: 2015-03-11

## 2015-03-11 MED ORDER — SODIUM CHLORIDE 0.9% FLUSH: 9.0000 mL | INTRAVENOUS | Status: DC | PRN

## 2015-03-11 MED ORDER — HEPARIN SOD (PORK) LOCK FLUSH 100 UNIT/ML IV SOLN
500.0000 [IU] | INTRAVENOUS | Status: DC | PRN
  Filled 2015-03-11: qty 5

## 2015-03-11 MED ORDER — DIPHENHYDRAMINE HCL 12.5 MG/5ML PO ELIX: 12.5000 mg | ORAL_SOLUTION | Freq: Four times a day (QID) | ORAL | Status: DC | PRN

## 2015-03-11 MED ORDER — HEPARIN SOD (PORK) LOCK FLUSH 100 UNIT/ML IV SOLN
500.0000 [IU] | INTRAVENOUS | Status: AC | PRN
  Administered 2015-03-11: 500 [IU]

## 2015-03-11 MED ORDER — SODIUM CHLORIDE 0.9 % IV SOLN
12.5000 mg | Freq: Four times a day (QID) | INTRAVENOUS | Status: DC | PRN
  Filled 2015-03-11: qty 0.25

## 2015-03-11 MED ORDER — KETOROLAC TROMETHAMINE 30 MG/ML IJ SOLN
30.0000 mg | Freq: Once | INTRAMUSCULAR | Status: AC
  Administered 2015-03-11: 30 mg via INTRAVENOUS
  Filled 2015-03-11: qty 1

## 2015-03-11 MED ORDER — HYDROMORPHONE 1 MG/ML IV SOLN
INTRAVENOUS | Status: DC
  Administered 2015-03-11: 11:00:00 via INTRAVENOUS
  Administered 2015-03-11: 12.6 mg via INTRAVENOUS
  Filled 2015-03-11: qty 25

## 2015-03-11 MED ORDER — SODIUM CHLORIDE 0.9% FLUSH
10.0000 mL | INTRAVENOUS | Status: AC | PRN
  Administered 2015-03-11: 10 mL

## 2015-03-11 MED ORDER — ONDANSETRON HCL 4 MG/2ML IJ SOLN: 4.0000 mg | Freq: Four times a day (QID) | INTRAMUSCULAR | Status: DC | PRN

## 2015-03-11 NOTE — Telephone Encounter (Signed)
Pt called back and told to [facility]. Pt voiced understanding and that he could be here in 45 mins.

## 2015-03-11 NOTE — H&P (Signed)
[facility] History and Physical   Date: 03/11/2015  Patient name: [name] Medical record number: [id number] Date of birth: [birth year] Age: 35 y.o. Gender: male PCP: [name], MD  Attending physician: Tresa Garter, MD  Chief Complaint: Lower extremity pain  History of Present Illness: [name], a 35 year old male with a history of sickle cell anemia, HbSS presents complaining of lower extremity pain for 1 week that is consistent with sickle cell anemia. He states that he has had a sudden increase in pain that is unable to be managed on prescribed oral analgesics. He says that pain intensity is 9/10, described as constant and throbbing in nature. He last had Dilaudid 4 mg around 7 am with minimal reliefPatient is on 3 liters of oxygen at home. He has not been wearing oxygen consistently. He maintains that he wears it at home but not while working or out in public. He did not come with his home oxygen today.Marland Kitchen He denies headache, fatigue, shortness of breath, chest pain, dysuria, nausea, vomiting, or diarrhea.   Meds: Prescriptions prior to admission  Medication Sig Dispense Refill Last Dose  . aspirin 81 MG chewable tablet Chew 1 tablet (81 mg total) by mouth daily. 30 tablet 11 03/10/2015 at Unknown time  . Cholecalciferol (VITAMIN D) 2000 units tablet Take 1 tablet (2,000 Units total) by mouth daily. 90 tablet 3 03/10/2015 at Unknown time  . enoxaparin (LOVENOX) 120 MG/0.8ML injection Inject 0.8 mLs (120 mg total) into the skin daily. 30 Syringe 2 03/10/2015 at Unknown time  . HYDROmorphone (DILAUDID) 4 MG tablet Take 1 tablet (4 mg total) by mouth every 4 (four) hours as needed for severe pain. 90 tablet 0 03/11/2015 at Unknown time  . hydroxyurea (HYDREA) 500 MG capsule Take 1 capsule (500 mg total) by mouth daily. May take with food to minimize GI side effects. 30 capsule 0 03/10/2015 at Unknown time  . lisinopril (PRINIVIL,ZESTRIL) 10 MG tablet  Take 1 tablet (10 mg total) by mouth daily. 30 tablet 1 03/10/2015 at Unknown time  . metoprolol succinate (TOPROL-XL) 50 MG 24 hr tablet Take 1 tablet (50 mg total) by mouth daily. 30 tablet 3 03/10/2015 at Unknown time  . morphine (MS CONTIN) 30 MG 12 hr tablet Take 1 tablet (30 mg total) by mouth every 12 (twelve) hours. 60 tablet 0 Past Week at Unknown time  . potassium chloride SA (K-DUR,KLOR-CON) 20 MEQ tablet Take 1 tablet (20 mEq total) by mouth every morning. 30 tablet 3 03/10/2015 at Unknown time  . zolpidem (AMBIEN) 10 MG tablet Take 1 tablet (10 mg total) by mouth at bedtime as needed for sleep. 30 tablet 0 Past Week at Unknown time  . cyclobenzaprine (FLEXERIL) 5 MG tablet Take 1 tablet (5 mg total) by mouth 3 (three) times daily as needed for muscle spasms. (Patient not taking: Reported on 03/06/2015) 20 tablet 0 Not Taking at Unknown time  . folic acid (FOLVITE) 1 MG tablet Take 1 tablet (1 mg total) by mouth every morning. 30 tablet 11 Unknown at Unknown time    Allergies: Review of patient's allergies indicates no known allergies. Past Medical History  Diagnosis Date  . Sickle cell anemia (HCC)   . Blood transfusion   . Acute embolism and thrombosis of right internal jugular vein ([place])   . Hypokalemia   . Mood disorder ([place])   . History of pulmonary embolus (PE)   . Avascular necrosis ([place])  Right Hip  . Leukocytosis     Chronic  . Thrombocytosis (HCC)     Chronic  . Hypertension   . History of Clostridium difficile infection   . Uses marijuana   . Chronic anticoagulation   . Functional asplenia   . Former smoker   . Second hand tobacco smoke exposure   . Alcohol consumption of one to four drinks per day   . Noncompliance with medication regimen   . Sickle-cell crisis with associated acute chest syndrome ([place]) 05/13/2013  . Acute chest syndrome ([place]) 06/18/2013  . Demand ischemia ([place]) 01/02/2014  . Pulmonary hypertension [facility])    Past Surgical History  Procedure  Laterality Date  . Right hip replacement      08/2006  . Cholecystectomy      01/2008  . Porta cath placement    . Porta cath removal    . Umbilical hernia repair      01/2008  . Excision of left periauricular cyst      10/2009  . Excision of right ear lobe cyst with primary closur      11/2007  . Portacath placement  01/05/2012    Procedure: INSERTION PORT-A-CATH;  Surgeon: Odis Hollingshead, MD;  Location: [place];  Service: General;  Laterality: N/A;  ultrasound guiced port a cath insertion with fluoroscopy   Family History  Problem Relation Age of Onset  . Sickle cell trait Mother   . Depression Mother   . Diabetes Mother   . Sickle cell trait Father   . Sickle cell trait Brother    Social History   Social History  . Marital Status: Single    Spouse Name: N/A  . Number of Children: 0  . Years of Education: 13   Occupational History  . 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 Smoking status: Former Smoker -- 13 years  . Smokeless tobacco: Never Used  . Alcohol Use: No  . Drug Use: 2.00 per week    Special: Marijuana     Comment: Marijuana weekly  . Sexual Activity:    Partners: Female    Museum/gallery curator: None     Comment: month ago   Other Topics Concern  . Not on file   Social History Narrative   Lives in an apartment.  Single.  Lives alone but has a girlfriend that helps care for him.  Does not use any assist devices.        [name]  [phone] Mom, emergency contact      [facility]:   Patient continuing to live in her apartment in town alone. 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    Review of Systems: Constitutional: negative for fatigue, fevers and malaise Eyes: positive for icterus Ears, nose, mouth, throat, and face: negative Respiratory: negative for cough and dyspnea on exertion Cardiovascular: negative for chest pain and lower extremity edema Gastrointestinal:  negative for diarrhea and jaundice Genitourinary:negative Integument/breast: negative Hematologic/lymphatic: negative Musculoskeletal:positive for back pain and myalgias Neurological: negative Behavioral/Psych: negative Endocrine: negative Allergic/Immunologic: negative  Physical Exam: Blood pressure 112/69, pulse 89, temperature 98.7 F (37.1 C), temperature source Oral, resp. rate 16, SpO2 98 %. BP 112/69 mmHg  Pulse 89  Temp(Src) 98.7 F (37.1 C) (Oral)  Resp 16  SpO2 98%  General Appearance:    Alert, cooperative, mild distress, appears older than stated age  Head:  Normocephalic, without obvious abnormality, atraumatic  Eyes:    PERRL, conjunctiva/corneas clear, scleral icterus, EOM's intact, fundi    benign, both eyes       Ears:    Normal TM's and external ear canals, both ears  Nose:   Nares normal, septum midline, mucosa normal, no drainage    or sinus tenderness  Throat:   Lips, mucosa, and tongue normal; teeth and gums normal  Neck:   Supple, symmetrical, trachea midline, no adenopathy;       thyroid:  No enlargement/tenderness/nodules; no carotid   bruit or JVD  Back:     Symmetric, no curvature, ROM normal, no CVA tenderness  Lungs:     Clear to auscultation bilaterally, respirations unlabored  Chest wall:    No tenderness or deformity  Heart:    Irregular rate and rhythm, S1 and S2 normal,r  Abdomen:     Soft, non-tender, bowel sounds active all four quadrants,    no masses, no organomegaly  Extremities:   Extremities normal, atraumatic, no cyanosis or edema  Pulses:   2+ and symmetric all extremities  Skin:   Skin color, texture, turgor normal, no rashes or lesions  Lymph nodes:   Cervical, supraclavicular, and axillary nodes normal  Neurologic:   CNII-XII intact. Normal strength, sensation and reflexes      throughout    Lab results: No results found. However, due to the size of the patient record, not all encounters were searched. Please check Results  Review for a complete set of results.  Imaging results:  No results found.   Assessment & Plan:  Patient will be admitted to the day infusion center for extended observation  Start IV D5.45 for cellular rehydration at 125/hr  Start Toradol 30 mg IV times 1 for inflammation  Start Dilaudid PCA High Concentration per weight based protocol.   Patient will be re-evaluated for pain intensity in the context of function and relationship to baseline as care progresses.  If no significant pain relief, will transfer patient to inpatient services for a higher level of care.   Leukocytosis: Reviewed labs on 03/06/2015, WBC count was 19.9. Will repeat CBCw/differential  Will check CMP,  LDH and CBC w/differential  2 liters of oxygen per nasal cannula  Wissam Resor M 03/11/2015, 10:23 AM

## 2015-03-11 NOTE — Telephone Encounter (Signed)
Patient called [facility]. Pt states pain is in his sides, legs and back. Had last pain med at 6:30 this am, Dilaudid and other regular meds. Pt states pain is 8/10. Will check with provider and call him back. Pt voiced understanding.

## 2015-03-11 NOTE — Discharge Summary (Signed)
[facility] Discharge Summary   Patient ID: MANFORD ECKHARDT MRN: [id number] DOB/AGE: 08-11-1980 35 y.o.  Admit date: 03/11/2015 Discharge date: 03/11/2015  Primary Care Physician:  [name], MD  Admission Diagnoses:  Active Problems:   Hb-SS disease with crisis [facility])  Discharge Medications:    Medication List    ASK your doctor about these medications        aspirin 81 MG chewable tablet  Chew 1 tablet (81 mg total) by mouth daily.     cyclobenzaprine 5 MG tablet  Commonly known as:  FLEXERIL  Take 1 tablet (5 mg total) by mouth 3 (three) times daily as needed for muscle spasms.     enoxaparin 120 MG/0.8ML injection  Commonly known as:  LOVENOX  Inject 0.8 mLs (120 mg total) into the skin daily.     folic acid 1 MG tablet  Commonly known as:  FOLVITE  Take 1 tablet (1 mg total) by mouth every morning.     HYDROmorphone 4 MG tablet  Commonly known as:  DILAUDID  Take 1 tablet (4 mg total) by mouth every 4 (four) hours as needed for severe pain.     hydroxyurea 500 MG capsule  Commonly known as:  HYDREA  Take 1 capsule (500 mg total) by mouth daily. May take with food to minimize GI side effects.     lisinopril 10 MG tablet  Commonly known as:  PRINIVIL,ZESTRIL  Take 1 tablet (10 mg total) by mouth daily.     metoprolol succinate 50 MG 24 hr tablet  Commonly known as:  TOPROL-XL  Take 1 tablet (50 mg total) by mouth daily.     morphine 30 MG 12 hr tablet  Commonly known as:  MS CONTIN  Take 1 tablet (30 mg total) by mouth every 12 (twelve) hours.     potassium chloride SA 20 MEQ tablet  Commonly known as:  K-DUR,KLOR-CON  Take 1 tablet (20 mEq total) by mouth every morning.     Vitamin D 2000 units tablet  Take 1 tablet (2,000 Units total) by mouth daily.     zolpidem 10 MG tablet  Commonly known as:  AMBIEN  Take 1 tablet (10 mg total) by mouth at bedtime as needed for sleep.         Consults:  None  Significant  Diagnostic Studies:  Dg Chest 2 View  03/04/2015  CLINICAL DATA:  Sickle cell pain, crisis, left-sided chest pain EXAM: CHEST  2 VIEW COMPARISON:  02/25/2015 FINDINGS: There is no focal consolidation. There is mild bilateral interstitial thickening. There is no pleural effusion or pneumothorax. There is stable cardiomegaly. There is a left-sided Port-A-Cath in satisfactory position. The osseous structures are unremarkable. IMPRESSION: Cardiomegaly with mild pulmonary vascular congestion. Electronically Signed   By: Kathreen Devoid   On: 03/04/2015 09:46   Dg Chest 2 View  02/25/2015  CLINICAL DATA:  Sickle cell crisis, pain in ribs and legs, generalized headache, former smoker, leukocytosis EXAM: CHEST  2 VIEW COMPARISON:  01/31/2015 FINDINGS: LEFT subclavian Port-A-Cath with tip projecting over SVC near cavoatrial junction. Enlargement of cardiac silhouette with pulmonary vascular congestion. Mediastinal contour stable. Increased opacity in RIGHT upper lobe suspicious for pneumonia. Remaining lungs clear. Mild chronic central peribronchial thickening. No pleural effusion or pneumothorax. Osseous structures stable IMPRESSION: Enlargement of cardiac silhouette with pulmonary vascular congestion. Question developing RIGHT upper lobe infiltrate. Electronically Signed   By: Lavonia Dana M.D.   On: 02/25/2015 10:49  [facility] Course: Patient was admitted to the day infusion center for extended observation.  Reviewed laboratory values white blood cell count decreased from 19.9 on 03/06/2015 to 13.4. All other labs consistent with previous values.  Patient was started on high concentration PCA dilaudid. He used a total of 12.6 mg with 22 demands and 18 deliveries. Pain intensity decreased from 9/10 to 4/10. Pt states that he can manage at home on current medication regimen.  Recommend that patient continue to hydrate. Oxygen saturation is currently 95% on 2 liters. Patient alert, oriented, and  ambulatory.   Mr. Raffetto is to follow up in office as scheduled.   Physical Exam at Discharge:    BP 109/67 mmHg  Pulse 86  Temp(Src) 98.7 F (37.1 C) (Oral)  Resp 21  SpO2 95%  General Appearance:    Alert, cooperative,  distress, appears stated age  Head:    Normocephalic, without obvious abnormality, atraumatic  Back:     Symmetric, no curvature, ROM normal, no CVA tenderness  Lungs:     Clear to auscultation bilaterally, respirations unlabored  Chest wall:    No tenderness or deformity  Heart:    Regular rate and rhythm, S1 and S2 normal, no murmur, rub   or gallop  Abdomen:     Soft, non-tender, bowel sounds active all four quadrants,    no masses, no organomegaly  Extremities:   Extremities normal, atraumatic, no cyanosis or edema  Pulses:   2+ and symmetric all extremities  Skin:   Skin color, texture, turgor normal, no rashes or lesions  Lymph nodes:   Cervical, supraclavicular, and axillary nodes normal  Neurologic:   CNII-XII intact. Normal strength, sensation and reflexes      throughout    Disposition at Discharge: 01-Home or Self Care  Discharge Orders:   Condition at Discharge:   Stable  Time spent on Discharge:  15 minutes  Signed: Rosita Guzzetta M 03/11/2015, 2:32 PM

## 2015-03-11 NOTE — Progress Notes (Signed)
Patient admitted to the [facility] for sickle cell crisis. Pain in both legs with pain rating 8/10. Treated with IV Toradol, IV fluids, and Dilaudid PCA. At time of discharge pain rating 4/10. Discharge instructions reviewed with patient. Discharged to home.

## 2015-03-17 ENCOUNTER — Telehealth: Payer: Self-pay | Admitting: Hematology

## 2015-03-17 ENCOUNTER — Encounter (HOSPITAL_COMMUNITY): Payer: Self-pay | Admitting: *Deleted

## 2015-03-17 ENCOUNTER — Non-Acute Institutional Stay (HOSPITAL_COMMUNITY)
Admission: AD | Admit: 2015-03-17 | Discharge: 2015-03-17 | Disposition: A | Discharge status: Home / Self Care | Payer: Medicare Other | Attending: Internal Medicine | Admitting: Internal Medicine

## 2015-03-17 DIAGNOSIS — R109 Unspecified abdominal pain: Secondary | ICD-10-CM | POA: Insufficient documentation

## 2015-03-17 DIAGNOSIS — Z7982 Long term (current) use of aspirin: Secondary | ICD-10-CM | POA: Insufficient documentation

## 2015-03-17 DIAGNOSIS — D5700 Hb-SS disease with crisis, unspecified: Secondary | ICD-10-CM | POA: Diagnosis not present

## 2015-03-17 DIAGNOSIS — Z79899 Other long term (current) drug therapy: Secondary | ICD-10-CM | POA: Diagnosis not present

## 2015-03-17 DIAGNOSIS — Z79891 Long term (current) use of opiate analgesic: Secondary | ICD-10-CM | POA: Insufficient documentation

## 2015-03-17 LAB — COMPREHENSIVE METABOLIC PANEL
ALK PHOS: 84 U/L (ref 38–126)
ALT: 24 U/L (ref 17–63)
ANION GAP: 8 (ref 5–15)
AST: 45 U/L — ABNORMAL HIGH (ref 15–41)
Albumin: 4.4 g/dL (ref 3.5–5.0)
BILIRUBIN TOTAL: 4.3 mg/dL — AB (ref 0.3–1.2)
BUN: 12 mg/dL (ref 6–20)
CALCIUM: 8.9 mg/dL (ref 8.9–10.3)
CO2: 23 mmol/L (ref 22–32)
CREATININE: 0.81 mg/dL (ref 0.61–1.24)
Chloride: 106 mmol/L (ref 101–111)
Glucose, Bld: 111 mg/dL — ABNORMAL HIGH (ref 65–99)
Potassium: 5.1 mmol/L (ref 3.5–5.1)
SODIUM: 137 mmol/L (ref 135–145)
TOTAL PROTEIN: 7.9 g/dL (ref 6.5–8.1)

## 2015-03-17 LAB — CBC WITH DIFFERENTIAL/PLATELET
BASOS PCT: 1 %
Basophils Absolute: 0.1 10*3/uL (ref 0.0–0.1)
EOS ABS: 0.4 10*3/uL (ref 0.0–0.7)
Eosinophils Relative: 3 %
HEMATOCRIT: 18.7 % — AB (ref 39.0–52.0)
HEMOGLOBIN: 6.3 g/dL — AB (ref 13.0–17.0)
LYMPHS PCT: 16 %
Lymphs Abs: 2 10*3/uL (ref 0.7–4.0)
MCH: 30.6 pg (ref 26.0–34.0)
MCHC: 33.7 g/dL (ref 30.0–36.0)
MCV: 90.8 fL (ref 78.0–100.0)
MONOS PCT: 16 %
Monocytes Absolute: 2 10*3/uL — ABNORMAL HIGH (ref 0.1–1.0)
NEUTROS ABS: 8.2 10*3/uL — AB (ref 1.7–7.7)
NRBC: 2 /100{WBCs} — AB
Neutrophils Relative %: 64 %
Platelets: 494 10*3/uL — ABNORMAL HIGH (ref 150–400)
RBC: 2.06 MIL/uL — ABNORMAL LOW (ref 4.22–5.81)
RDW: 21.8 % — ABNORMAL HIGH (ref 11.5–15.5)
WBC: 12.7 10*3/uL — ABNORMAL HIGH (ref 4.0–10.5)

## 2015-03-17 LAB — PREPARE RBC (CROSSMATCH)

## 2015-03-17 LAB — HEMOGLOBIN AND HEMATOCRIT, BLOOD
HEMATOCRIT: 21.7 % — AB (ref 39.0–52.0)
Hemoglobin: 7.3 g/dL — ABNORMAL LOW (ref 13.0–17.0)

## 2015-03-17 MED ORDER — SODIUM CHLORIDE 0.9 % IV SOLN
Freq: Once | INTRAVENOUS | Status: AC
  Administered 2015-03-17: 13:00:00 via INTRAVENOUS

## 2015-03-17 MED ORDER — ONDANSETRON HCL 4 MG/2ML IJ SOLN: 4.0000 mg | Freq: Four times a day (QID) | INTRAMUSCULAR | Status: DC | PRN

## 2015-03-17 MED ORDER — HYDROMORPHONE HCL 4 MG PO TABS
4.0000 mg | ORAL_TABLET | Freq: Once | ORAL | Status: AC
  Administered 2015-03-17: 4 mg via ORAL
  Filled 2015-03-17: qty 1

## 2015-03-17 MED ORDER — NALOXONE HCL 0.4 MG/ML IJ SOLN: 0.4000 mg | INTRAMUSCULAR | Status: DC | PRN

## 2015-03-17 MED ORDER — SODIUM CHLORIDE 0.9 % IV SOLN
12.5000 mg | Freq: Four times a day (QID) | INTRAVENOUS | Status: DC | PRN
  Filled 2015-03-17: qty 0.25

## 2015-03-17 MED ORDER — DEXTROSE-NACL 5-0.45 % IV SOLN
INTRAVENOUS | Status: DC
  Administered 2015-03-17: 10:00:00 via INTRAVENOUS

## 2015-03-17 MED ORDER — SODIUM CHLORIDE 0.9% FLUSH: 9.0000 mL | INTRAVENOUS | Status: DC | PRN

## 2015-03-17 MED ORDER — HYDROMORPHONE 1 MG/ML IV SOLN
INTRAVENOUS | Status: DC
  Administered 2015-03-17: 10:00:00 via INTRAVENOUS
  Administered 2015-03-17: 17.5 mg via INTRAVENOUS
  Filled 2015-03-17: qty 25

## 2015-03-17 MED ORDER — KETOROLAC TROMETHAMINE 30 MG/ML IJ SOLN
30.0000 mg | Freq: Once | INTRAMUSCULAR | Status: AC
  Administered 2015-03-17: 30 mg via INTRAVENOUS
  Filled 2015-03-17: qty 1

## 2015-03-17 MED ORDER — DIPHENHYDRAMINE HCL 12.5 MG/5ML PO ELIX
12.5000 mg | ORAL_SOLUTION | Freq: Four times a day (QID) | ORAL | Status: DC | PRN
  Administered 2015-03-17: 12.5 mg via ORAL
  Filled 2015-03-17: qty 5

## 2015-03-17 NOTE — Telephone Encounter (Signed)
Spoke with [place] and it is ok for patient to come to [facility].  Patient verbalizes understanding.

## 2015-03-17 NOTE — Progress Notes (Signed)
[name], a 35 year old male with a history of HbSS presents to the day infusion center with a sickle cell pain crisis. Patient has a critical hemoglobin of 6.3, which is decreased from 6 days ago. Will transfuse 1 unit of packed red blood cells during extended observation and will check H&H 1 hour post transfusion.    Dorena Dew, FNP

## 2015-03-17 NOTE — H&P (Signed)
[facility] History and Physical   Date: 03/17/2015  Patient name: KAEDYN HOWLE Medical record number: [id number] Date of birth: 11-25-1980 Age: 35 y.o. Gender: male PCP: [name], MD  Attending physician: Tresa Garter, MD  Chief Complaint: Left flank pain  History of Present Illness: Mr. Waylen Levi, a 35 year old male with a history of sickle cell anemia, HbSS presents complaining of left flank pain that is consistent with sickle cell anemia. He maintains that pain started on Saturday after work and has not been controlled on home medication regimen since that time. Current pain intensity is 8/10 described as constant and throbbing. He last had Dilaudid 4 mg around 7 am with minimal relief. He is taking all other medications consistently. He denies headache, chest pains, fatigue, dysuria, nausea, vomiting, or diarrhea.  Meds: Prescriptions prior to admission  Medication Sig Dispense Refill Last Dose  . aspirin 81 MG chewable tablet Chew 1 tablet (81 mg total) by mouth daily. 30 tablet 11 03/16/2015 at Unknown time  . Cholecalciferol (VITAMIN D) 2000 units tablet Take 1 tablet (2,000 Units total) by mouth daily. 90 tablet 3 03/16/2015 at Unknown time  . enoxaparin (LOVENOX) 120 MG/0.8ML injection Inject 0.8 mLs (120 mg total) into the skin daily. 30 Syringe 2 03/16/2015 at Unknown time  . folic acid (FOLVITE) 1 MG tablet Take 1 tablet (1 mg total) by mouth every morning. 30 tablet 11 03/16/2015 at Unknown time  . HYDROmorphone (DILAUDID) 4 MG tablet Take 1 tablet (4 mg total) by mouth every 4 (four) hours as needed for severe pain. 90 tablet 0 03/17/2015 at Unknown time  . hydroxyurea (HYDREA) 500 MG capsule Take 1 capsule (500 mg total) by mouth daily. May take with food to minimize GI side effects. 30 capsule 0 03/16/2015 at Unknown time  . lisinopril (PRINIVIL,ZESTRIL) 10 MG tablet Take 1 tablet (10 mg total) by mouth daily. 30 tablet 1 03/16/2015 at  Unknown time  . metoprolol succinate (TOPROL-XL) 50 MG 24 hr tablet Take 1 tablet (50 mg total) by mouth daily. 30 tablet 3 03/16/2015 at Unknown time  . morphine (MS CONTIN) 30 MG 12 hr tablet Take 1 tablet (30 mg total) by mouth every 12 (twelve) hours. 60 tablet 0 03/17/2015 at Unknown time  . potassium chloride SA (K-DUR,KLOR-CON) 20 MEQ tablet Take 1 tablet (20 mEq total) by mouth every morning. 30 tablet 3 03/16/2015 at Unknown time  . zolpidem (AMBIEN) 10 MG tablet Take 1 tablet (10 mg total) by mouth at bedtime as needed for sleep. 30 tablet 0 Past Week at Unknown time  . cyclobenzaprine (FLEXERIL) 5 MG tablet Take 1 tablet (5 mg total) by mouth 3 (three) times daily as needed for muscle spasms. (Patient not taking: Reported on 03/06/2015) 20 tablet 0 Not Taking at Unknown time    Allergies: Review of patient's allergies indicates no known allergies. Past Medical History  Diagnosis Date  . Sickle cell anemia (HCC)   . Blood transfusion   . Acute embolism and thrombosis of right internal jugular vein ([place])   . Hypokalemia   . Mood disorder ([place])   . History of pulmonary embolus (PE)   . Avascular necrosis (HCC)     Right Hip  . Leukocytosis     Chronic  . Thrombocytosis (HCC)     Chronic  . Hypertension   . History of Clostridium difficile infection   . Uses marijuana   . Chronic anticoagulation   .  Functional asplenia   . Former smoker   . Second hand tobacco smoke exposure   . Alcohol consumption of one to four drinks per day   . Noncompliance with medication regimen   . Sickle-cell crisis with associated acute chest syndrome ([place]) 05/13/2013  . Acute chest syndrome ([place]) 06/18/2013  . Demand ischemia ([place]) 01/02/2014  . Pulmonary hypertension [facility])    Past Surgical History  Procedure Laterality Date  . Right hip replacement      08/2006  . Cholecystectomy      01/2008  . Porta cath placement    . Porta cath removal    . Umbilical hernia repair      01/2008  . Excision  of left periauricular cyst      10/2009  . Excision of right ear lobe cyst with primary closur      11/2007  . Portacath placement  01/05/2012    Procedure: INSERTION PORT-A-CATH;  Surgeon: Odis Hollingshead, MD;  Location: [place];  Service: General;  Laterality: N/A;  ultrasound guiced port a cath insertion with fluoroscopy   Family History  Problem Relation Age of Onset  . Sickle cell trait Mother   . Depression Mother   . Diabetes Mother   . Sickle cell trait Father   . Sickle cell trait Brother    Social History   Social History  . Marital Status: Single    Spouse Name: N/A  . Number of Children: 0  . Years of Education: 13   Occupational History  . 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 Smoking status: Former Smoker -- 13 years  . Smokeless tobacco: Never Used  . Alcohol Use: No  . Drug Use: 2.00 per week    Special: Marijuana     Comment: Marijuana weekly  . Sexual Activity:    Partners: Female    Museum/gallery curator: None     Comment: month ago   Other Topics Concern  . Not on file   Social History Narrative   Lives in an apartment.  Single.  Lives alone but has a girlfriend that helps care for him.  Does not use any assist devices.        [name]  [phone] Mom, emergency contact      [name]:   Patient continuing to live in her apartment in town alone. 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    Review of Systems: Eyes: positive for icterus Ears, nose, mouth, throat, and face: negative Respiratory: positive for dyspnea on exertion, negative for emphysema, sputum and wheezing Cardiovascular: negative Gastrointestinal: negative for abdominal pain, constipation and dysphagia Genitourinary:positive for dysuria and frequency Integument/breast: negative Hematologic/lymphatic: negative Musculoskeletal:positive for bone pain and myalgias Neurological: negative Behavioral/Psych:  negative Endocrine: negative  Physical Exam: Blood pressure 107/69, pulse 81, temperature 98.5 F (36.9 C), temperature source Oral, resp. rate 16, height 6\' 1"  (1.854 m), weight 165 lb (74.844 kg), SpO2 96 %.   General Appearance:    Alert, cooperative, mild distress, appears stated age  Head:    Normocephalic, without obvious abnormality, atraumatic  Eyes:    PERRL, conjunctiva/corneas clear, EOM's intact, fundi    benign, scleral icterus      Ears:    Normal TM's and external ear canals, both ears  Nose:   Nares normal, septum midline, mucosa normal, no drainage    or sinus tenderness  Throat:   Lips, mucosa, and tongue normal; teeth and gums normal  Neck:   Supple, symmetrical, trachea midline, no adenopathy;       thyroid:  No enlargement/tenderness/nodules; no carotid   bruit or JVD  Back:     Symmetric, no curvature, ROM normal, no CVA tenderness  Lungs:     Clear to auscultation bilaterally, respirations unlabored  Chest wall:    No tenderness or deformity  Heart:    Regular rate and rhythm, S1 and S2 normal, no murmur, rub   or gallop  Extremities:   Extremities normal, atraumatic, no cyanosis or edema  Pulses:   2+ and symmetric all extremities  Skin:   Skin color, texture, turgor normal, no rashes or lesions  Lymph nodes:   Cervical, supraclavicular, and axillary nodes normal  Neurologic:   CNII-XII intact. Normal strength, sensation and reflexes      throughout    Lab results: No results found. However, due to the size of the patient record, not all encounters were searched. Please check Results Review for a complete set of results.  Imaging results:  No results found. Assessment & Plan:  Patient will be admitted to the day infusion center for extended observation  Start IV D5.45 for cellular rehydration at 100/hr  Start Toradol 30 mg IV times 53for inflammation.  Start Dilaudid PCA High Concentration per weight based protocol.   Patient will be re-evaluated  for pain intensity in the context of function and relationship to          baseline as care progresses.  If no significant pain relief, will transfer patient to inpatient services for a higher level of care.   Will check CMP,  Retiiculocytes, and CBC w/differential  Start oxygen via n/c at 2 liters per home dose  Scherrie Seneca M 03/17/2015, 9:28 AM

## 2015-03-17 NOTE — Progress Notes (Signed)
CRITICAL VALUE ALERT  Critical value received:  Hgb 6.3  Date of notification:  03/17/2015  Time of notification:  10:04  Critical value read back:yes  Nurse who received alert:  Roberto Scales, RN  [place] Hollis, NP notified verbally

## 2015-03-17 NOTE — Progress Notes (Signed)
Patient admitted to the [facility] with pain crisis, pain in side with a score of 8/10. Patient treated with IV fluids, Dilaudid PCA, Toradol IV and po Dilaudid. At discharge patient 's pain level down to 5/10. Discharge instructions reviewed with patient and encouraged po fluids, and medications as prescribed. Patient voiced understanding of instructions. Discharge instructions given to patient. Patient alert, oriented and ambulatory. Discharged to home.

## 2015-03-17 NOTE — Discharge Summary (Signed)
[facility] Discharge Summary   Patient ID: [name] MRN: [id number] DOB/AGE: 03-10-1980 35 y.o.  Admit date: 03/17/2015 Discharge date: 03/17/2015  Primary Care Physician:  [name], MD  Admission Diagnoses:  Active Problems:   Hb-SS disease with crisis [facility])  Discharge Medications:    Medication List    ASK your doctor about these medications        aspirin 81 MG chewable tablet  Chew 1 tablet (81 mg total) by mouth daily.     cyclobenzaprine 5 MG tablet  Commonly known as:  FLEXERIL  Take 1 tablet (5 mg total) by mouth 3 (three) times daily as needed for muscle spasms.     enoxaparin 120 MG/0.8ML injection  Commonly known as:  LOVENOX  Inject 0.8 mLs (120 mg total) into the skin daily.     folic acid 1 MG tablet  Commonly known as:  FOLVITE  Take 1 tablet (1 mg total) by mouth every morning.     HYDROmorphone 4 MG tablet  Commonly known as:  DILAUDID  Take 1 tablet (4 mg total) by mouth every 4 (four) hours as needed for severe pain.     hydroxyurea 500 MG capsule  Commonly known as:  HYDREA  Take 1 capsule (500 mg total) by mouth daily. May take with food to minimize GI side effects.     lisinopril 10 MG tablet  Commonly known as:  PRINIVIL,ZESTRIL  Take 1 tablet (10 mg total) by mouth daily.     metoprolol succinate 50 MG 24 hr tablet  Commonly known as:  TOPROL-XL  Take 1 tablet (50 mg total) by mouth daily.     morphine 30 MG 12 hr tablet  Commonly known as:  MS CONTIN  Take 1 tablet (30 mg total) by mouth every 12 (twelve) hours.     potassium chloride SA 20 MEQ tablet  Commonly known as:  K-DUR,KLOR-CON  Take 1 tablet (20 mEq total) by mouth every morning.     Vitamin D 2000 units tablet  Take 1 tablet (2,000 Units total) by mouth daily.     zolpidem 10 MG tablet  Commonly known as:  AMBIEN  Take 1 tablet (10 mg total) by mouth at bedtime as needed for sleep.         Consults:  None  Significant  Diagnostic Studies:  Dg Chest 2 View  03/04/2015  CLINICAL DATA:  Sickle cell pain, crisis, left-sided chest pain EXAM: CHEST  2 VIEW COMPARISON:  02/25/2015 FINDINGS: There is no focal consolidation. There is mild bilateral interstitial thickening. There is no pleural effusion or pneumothorax. There is stable cardiomegaly. There is a left-sided Port-A-Cath in satisfactory position. The osseous structures are unremarkable. IMPRESSION: Cardiomegaly with mild pulmonary vascular congestion. Electronically Signed   By: Kathreen Devoid   On: 03/04/2015 09:46   Dg Chest 2 View  02/25/2015  CLINICAL DATA:  Sickle cell crisis, pain in ribs and legs, generalized headache, former smoker, leukocytosis EXAM: CHEST  2 VIEW COMPARISON:  01/31/2015 FINDINGS: LEFT subclavian Port-A-Cath with tip projecting over SVC near cavoatrial junction. Enlargement of cardiac silhouette with pulmonary vascular congestion. Mediastinal contour stable. Increased opacity in RIGHT upper lobe suspicious for pneumonia. Remaining lungs clear. Mild chronic central peribronchial thickening. No pleural effusion or pneumothorax. Osseous structures stable IMPRESSION: Enlargement of cardiac silhouette with pulmonary vascular congestion. Question developing RIGHT upper lobe infiltrate. Electronically Signed   By: Lavonia Dana M.D.   On: 02/25/2015 10:49  [facility] Course: [name] was [facility] the [facility] for extended observation.  He had a critical hemoglobin of 6.3, which was decreased from 7.0 six days ago. Patient was having 8-9/10 pain on admission and reported dyspnea on exertion. Transfused 1 unit of packed red blood cells.  Patient was also started on on high concentration PCA dilaudid, he used a total of 17.5 mg with 29 demands and 25 deliveries. Pain intensity decreased to 4/10 prior to discharge.  Patient is alert, oriented, and ambulating. [name] He was reminded to follow up in office as scheduled and has recently had a referral sent to hematologist.  Physical Exam at Discharge:  BP 125/73 mmHg  Pulse 77  Temp(Src) 98.8 F (37.1 C) (Oral)  Resp 19  Ht 6\' 1"  (1.854 m)  Wt 165 lb (74.844 kg)  BMI 21.77 kg/m2  SpO2 96%  BP 125/73 mmHg  Pulse 77  Temp(Src) 98.8 F (37.1 C) (Oral)  Resp 19  Ht 6\' 1"  (1.854 m)  Wt 165 lb (74.844 kg)  BMI 21.77 kg/m2  SpO2 96%  General Appearance:    Alert, cooperative, no distress, appears stated age  Head:    Normocephalic, without obvious abnormality, atraumatic  Eyes:    PERRL, conjunctiva/corneas clear, EOM's intact, fundi    benign, both eyes       Lungs:     Clear to auscultation bilaterally, respirations unlabored  Chest wall:    No tenderness or deformity  Heart:    Regular rate and rhythm, S1 and S2 normal, no murmur, rub   or gallop  Abdomen:     Soft, non-tender, bowel sounds active all four quadrants,    no masses, no organomegaly  Extremities:   Extremities normal, atraumatic, no cyanosis or edema  Pulses:   2+ and symmetric all extremities  Skin:   Skin color, texture, turgor normal, no rashes or lesions  Lymph nodes:   Cervical, supraclavicular, and axillary nodes normal  Neurologic:   CNII-XII intact. Normal strength, sensation and reflexes      throughout    Disposition at Discharge: 01-Home or Self Care  Discharge Orders:   Condition at Discharge:   Stable  Time spent on Discharge:  15 minutes  Signed: Hollis,Lachina M 03/17/2015, 4:10 PM

## 2015-03-17 NOTE — Telephone Encounter (Signed)
Patient C/O pain to ribs and back.  Patient rates pain 8/10 on pain.  Patient state no improvement with home medications.  Patient denies chest pain or shortness of breath, difficulty breathing.  Patient denies N/V/D or abdominal pain.  I advised that I will talk with provider and give him a call back.  Patient verbalizes understanding

## 2015-03-18 LAB — TYPE AND SCREEN
ABO/RH(D): O POS
ANTIBODY SCREEN: NEGATIVE
Unit division: 0

## 2015-03-19 ENCOUNTER — Emergency Department (HOSPITAL_COMMUNITY): Payer: Medicare Other

## 2015-03-19 ENCOUNTER — Encounter (HOSPITAL_COMMUNITY): Payer: Self-pay

## 2015-03-19 ENCOUNTER — Emergency Department (HOSPITAL_COMMUNITY)
Admission: EM | Admit: 2015-03-19 | Discharge: 2015-03-19 | Disposition: A | Discharge status: Home / Self Care | Payer: Medicare Other | Attending: Emergency Medicine | Admitting: Emergency Medicine

## 2015-03-19 DIAGNOSIS — Z8739 Personal history of other diseases of the musculoskeletal system and connective tissue: Secondary | ICD-10-CM | POA: Insufficient documentation

## 2015-03-19 DIAGNOSIS — Z8639 Personal history of other endocrine, nutritional and metabolic disease: Secondary | ICD-10-CM | POA: Diagnosis not present

## 2015-03-19 DIAGNOSIS — Z86718 Personal history of other venous thrombosis and embolism: Secondary | ICD-10-CM | POA: Diagnosis not present

## 2015-03-19 DIAGNOSIS — Q8901 Asplenia (congenital): Secondary | ICD-10-CM | POA: Diagnosis not present

## 2015-03-19 DIAGNOSIS — Z86711 Personal history of pulmonary embolism: Secondary | ICD-10-CM | POA: Insufficient documentation

## 2015-03-19 DIAGNOSIS — Z79899 Other long term (current) drug therapy: Secondary | ICD-10-CM | POA: Insufficient documentation

## 2015-03-19 DIAGNOSIS — Z8619 Personal history of other infectious and parasitic diseases: Secondary | ICD-10-CM | POA: Diagnosis not present

## 2015-03-19 DIAGNOSIS — D571 Sickle-cell disease without crisis: Secondary | ICD-10-CM | POA: Diagnosis not present

## 2015-03-19 DIAGNOSIS — Z8659 Personal history of other mental and behavioral disorders: Secondary | ICD-10-CM | POA: Insufficient documentation

## 2015-03-19 DIAGNOSIS — Z7901 Long term (current) use of anticoagulants: Secondary | ICD-10-CM | POA: Diagnosis not present

## 2015-03-19 DIAGNOSIS — Z87891 Personal history of nicotine dependence: Secondary | ICD-10-CM | POA: Insufficient documentation

## 2015-03-19 DIAGNOSIS — Z7982 Long term (current) use of aspirin: Secondary | ICD-10-CM | POA: Insufficient documentation

## 2015-03-19 DIAGNOSIS — I10 Essential (primary) hypertension: Secondary | ICD-10-CM | POA: Insufficient documentation

## 2015-03-19 DIAGNOSIS — D5700 Hb-SS disease with crisis, unspecified: Secondary | ICD-10-CM | POA: Insufficient documentation

## 2015-03-19 DIAGNOSIS — D57219 Sickle-cell/Hb-C disease with crisis, unspecified: Secondary | ICD-10-CM | POA: Diagnosis not present

## 2015-03-19 LAB — BASIC METABOLIC PANEL
ANION GAP: 6 (ref 5–15)
BUN: 17 mg/dL (ref 6–20)
CALCIUM: 9.3 mg/dL (ref 8.9–10.3)
CO2: 22 mmol/L (ref 22–32)
Chloride: 106 mmol/L (ref 101–111)
Creatinine, Ser: 0.75 mg/dL (ref 0.61–1.24)
Glucose, Bld: 105 mg/dL — ABNORMAL HIGH (ref 65–99)
Potassium: 4.5 mmol/L (ref 3.5–5.1)
SODIUM: 134 mmol/L — AB (ref 135–145)

## 2015-03-19 LAB — CBC WITH DIFFERENTIAL/PLATELET
Basophils Absolute: 0 10*3/uL (ref 0.0–0.1)
Basophils Relative: 0 %
EOS ABS: 0.3 10*3/uL (ref 0.0–0.7)
Eosinophils Relative: 2 %
HCT: 23.1 % — ABNORMAL LOW (ref 39.0–52.0)
Hemoglobin: 7.9 g/dL — ABNORMAL LOW (ref 13.0–17.0)
LYMPHS PCT: 13 %
Lymphs Abs: 2 10*3/uL (ref 0.7–4.0)
MCH: 30.9 pg (ref 26.0–34.0)
MCHC: 34.2 g/dL (ref 30.0–36.0)
MCV: 90.2 fL (ref 78.0–100.0)
MONO ABS: 1.7 10*3/uL — AB (ref 0.1–1.0)
Monocytes Relative: 11 %
NEUTROS PCT: 74 %
NRBC: 1 /100{WBCs} — AB
Neutro Abs: 11.1 10*3/uL — ABNORMAL HIGH (ref 1.7–7.7)
PLATELETS: 550 10*3/uL — AB (ref 150–400)
RBC: 2.56 MIL/uL — ABNORMAL LOW (ref 4.22–5.81)
RDW: 21.6 % — AB (ref 11.5–15.5)
WBC: 15.1 10*3/uL — ABNORMAL HIGH (ref 4.0–10.5)

## 2015-03-19 LAB — RETICULOCYTES
RBC.: 2.56 MIL/uL — AB (ref 4.22–5.81)
RETIC COUNT ABSOLUTE: 297 10*3/uL — AB (ref 19.0–186.0)
Retic Ct Pct: 11.6 % — ABNORMAL HIGH (ref 0.4–3.1)

## 2015-03-19 MED ORDER — KETOROLAC TROMETHAMINE 30 MG/ML IJ SOLN
30.0000 mg | INTRAMUSCULAR | Status: AC
  Administered 2015-03-19: 30 mg via INTRAVENOUS
  Filled 2015-03-19: qty 1

## 2015-03-19 MED ORDER — DEXTROSE 5 % IV BOLUS
1000.0000 mL | Freq: Once | INTRAVENOUS | Status: AC
  Administered 2015-03-19: 1000 mL via INTRAVENOUS

## 2015-03-19 MED ORDER — HEPARIN SOD (PORK) LOCK FLUSH 100 UNIT/ML IV SOLN
500.0000 [IU] | Freq: Once | INTRAVENOUS | Status: AC
  Administered 2015-03-19: 500 [IU]
  Filled 2015-03-19: qty 5

## 2015-03-19 MED ORDER — HYDROMORPHONE HCL 2 MG/ML IJ SOLN: 0.0313 mg/kg | INTRAMUSCULAR | Status: AC

## 2015-03-19 MED ORDER — ONDANSETRON HCL 4 MG/2ML IJ SOLN
4.0000 mg | INTRAMUSCULAR | Status: DC | PRN
  Administered 2015-03-19: 4 mg via INTRAVENOUS
  Filled 2015-03-19: qty 2

## 2015-03-19 MED ORDER — HYDROMORPHONE HCL 2 MG/ML IJ SOLN
0.0250 mg/kg | INTRAMUSCULAR | Status: AC
  Administered 2015-03-19: 1.9 mg via INTRAVENOUS
  Filled 2015-03-19: qty 1

## 2015-03-19 MED ORDER — HYDROMORPHONE HCL 2 MG/ML IJ SOLN
0.0313 mg/kg | INTRAMUSCULAR | Status: AC
  Administered 2015-03-19: 2.3 mg via INTRAVENOUS
  Filled 2015-03-19: qty 2

## 2015-03-19 MED ORDER — HYDROMORPHONE HCL 2 MG/ML IJ SOLN: 0.0250 mg/kg | INTRAMUSCULAR | Status: AC

## 2015-03-19 MED ORDER — DIPHENHYDRAMINE HCL 25 MG PO CAPS
25.0000 mg | ORAL_CAPSULE | ORAL | Status: DC | PRN
  Administered 2015-03-19: 25 mg via ORAL
  Filled 2015-03-19: qty 1

## 2015-03-19 MED ORDER — HYDROMORPHONE HCL 2 MG PO TABS
4.0000 mg | ORAL_TABLET | Freq: Once | ORAL | Status: AC
  Administered 2015-03-19: 4 mg via ORAL
  Filled 2015-03-19: qty 2

## 2015-03-19 NOTE — ED Notes (Signed)
MD at bedside. 

## 2015-03-19 NOTE — Discharge Instructions (Signed)
Sickle Cell Anemia, Adult Sickle cell anemia is a condition in which red blood cells have an abnormal "sickle" shape. This abnormal shape shortens the cells' life span, which results in a lower than normal concentration of red blood cells in the blood. The sickle shape also causes the cells to clump together and block free blood flow through the blood vessels. As a result, the tissues and organs of the body do not receive enough oxygen. Sickle cell anemia causes organ damage and pain and increases the risk of infection. CAUSES  Sickle cell anemia is a genetic disorder. Those who receive two copies of the gene have the condition, and those who receive one copy have the trait. RISK FACTORS The sickle cell gene is most common in people whose families originated in [place]. Other areas of the globe where sickle cell trait occurs include the [place], [place] and [place], the [place], and the [place].  SIGNS AND SYMPTOMS  Pain, especially in the extremities, back, chest, or abdomen (common). The pain may start suddenly or may develop following an illness, especially if there is dehydration. Pain can also occur due to overexertion or exposure to extreme temperature changes.  Frequent severe bacterial infections, especially certain types of pneumonia and meningitis.  Pain and swelling in the hands and feet.  Decreased activity.   Loss of appetite.   Change in behavior.  Headaches.  Seizures.  Shortness of breath or difficulty breathing.  Vision changes.  Skin ulcers. Those with the trait may not have symptoms or they may have mild symptoms.  DIAGNOSIS  Sickle cell anemia is diagnosed with blood tests that demonstrate the genetic trait. It is often diagnosed during the newborn period, due to mandatory testing nationwide. A variety of blood tests, X-rays, CT scans, MRI scans, ultrasounds, and lung function tests may also be done to monitor the condition. TREATMENT  Sickle  cell anemia may be treated with:  Medicines. You may be given pain medicines, antibiotic medicines (to treat and prevent infections) or medicines to increase the production of certain types of hemoglobin.  Fluids.  Oxygen.  Blood transfusions. HOME CARE INSTRUCTIONS   Drink enough fluid to keep your urine clear or pale yellow. Increase your fluid intake in hot weather and during exercise.  Do not smoke. Smoking lowers oxygen levels in the blood.   Only take over-the-counter or prescription medicines for pain, fever, or discomfort as directed by your health care provider.  Take antibiotics as directed by your health care provider. Make sure you finish them it even if you start to feel better.   Take supplements as directed by your health care provider.   Consider wearing a medical alert bracelet. This tells anyone caring for you in an emergency of your condition.   When traveling, keep your medical information, health care provider's names, and the medicines you take with you at all times.   If you develop a fever, do not take medicines to reduce the fever right away. This could cover up a problem that is developing. Notify your health care provider.  Keep all follow-up appointments with your health care provider. Sickle cell anemia requires regular medical care. SEEK MEDICAL CARE IF: You have a fever. SEEK IMMEDIATE MEDICAL CARE IF:   You feel dizzy or faint.   You have new abdominal pain, especially on the left side near the stomach area.   You develop a persistent, often uncomfortable and painful penile erection (priapism). If this is not treated immediately it   will lead to impotence.   You have numbness your arms or legs or you have a hard time moving them.   You have a hard time with speech.   You have a fever or persistent symptoms for more than 2-3 days.   You have a fever and your symptoms suddenly get worse.   You have signs or symptoms of infection.  These include:   Chills.   Abnormal tiredness (lethargy).   Irritability.   Poor eating.   Vomiting.   You develop pain that is not helped with medicine.   You develop shortness of breath.  You have pain in your chest.   You are coughing up pus-like or bloody sputum.   You develop a stiff neck.  Your feet or hands swell or have pain.  Your abdomen appears bloated.  You develop joint pain. MAKE SURE YOU:  Understand these instructions.   This information is not intended to replace advice given to you by your health care provider. Make sure you discuss any questions you have with your health care provider.   Document Released: 04/27/2005 Document Revised: 02/07/2014 Document Reviewed: 08/29/2012 Elsevier Interactive Patient Education 2016 [employer]  

## 2015-03-19 NOTE — ED Provider Notes (Signed)
CSN: [id number]     Arrival date & time 03/19/15  [id number] History   First MD Initiated Contact with Patient 03/19/15 [phone]     Chief Complaint  Patient presents with  . Sickle Cell Pain Crisis     (Consider location/radiation/quality/duration/timing/severity/associated sxs/prior Treatment) Patient is a 35 y.o. male presenting with sickle cell pain. The history is provided by the patient.  Sickle Cell Pain Crisis Location:  Chest (Over "both my sides") Severity:  Moderate Onset quality:  Gradual Duration:  1 day Similar to previous crisis episodes: yes   Timing:  Constant Progression:  Unchanged Chronicity:  New Sickle cell genotype:  SS History of pulmonary emboli: yes   Context: not alcohol consumption, not infection and not non-compliance   Relieved by:  Nothing Worsened by:  Movement and deep breathing Ineffective treatments:  Prescription drugs Associated symptoms: chest pain (none in the central chest area)   Associated symptoms: no cough, no fever, no shortness of breath and no wheezing   Risk factors: frequent admissions for pain and frequent pain crises     Past Medical History  Diagnosis Date  . Sickle cell anemia (HCC)   . Blood transfusion   . Acute embolism and thrombosis of right internal jugular vein ([place])   . Hypokalemia   . Mood disorder ([place])   . History of pulmonary embolus (PE)   . Avascular necrosis (HCC)     Right Hip  . Leukocytosis     Chronic  . Thrombocytosis (HCC)     Chronic  . Hypertension   . History of Clostridium difficile infection   . Uses marijuana   . Chronic anticoagulation   . Functional asplenia   . Former smoker   . Second hand tobacco smoke exposure   . Alcohol consumption of one to four drinks per day   . Noncompliance with medication regimen   . Sickle-cell crisis with associated acute chest syndrome ([place]) 05/13/2013  . Acute chest syndrome ([place]) 06/18/2013  . Demand ischemia ([place]) 01/02/2014  . Pulmonary hypertension [facility])     Past Surgical History  Procedure Laterality Date  . Right hip replacement      08/2006  . Cholecystectomy      01/2008  . Porta cath placement    . Porta cath removal    . Umbilical hernia repair      01/2008  . Excision of left periauricular cyst      10/2009  . Excision of right ear lobe cyst with primary closur      11/2007  . Portacath placement  01/05/2012    Procedure: INSERTION PORT-A-CATH;  Surgeon: Odis Hollingshead, MD;  Location: [facility];  Service: General;  Laterality: N/A;  ultrasound guiced port a cath insertion with fluoroscopy   Family History  Problem Relation Age of Onset  . Sickle cell trait Mother   . Depression Mother   . Diabetes Mother   . Sickle cell trait Father   . Sickle cell trait Brother    Social History  Substance Use Topics  . Smoking status: Former Smoker -- 13 years  . Smokeless tobacco: Never Used  . Alcohol Use: No    Review of Systems  Constitutional: Negative for fever.  Respiratory: Negative for cough, shortness of breath and wheezing.   Cardiovascular: Positive for chest pain (none in the central chest area).  All other systems reviewed and are negative.     Allergies  Review of patient's allergies indicates no known allergies.  Home Medications   Prior to Admission medications   Medication Sig Start Date End Date Taking? Authorizing Provider  aspirin 81 MG chewable tablet Chew 1 tablet (81 mg total) by mouth daily. 07/23/14  Yes [name], MD  Cholecalciferol (VITAMIN D) 2000 units tablet Take 1 tablet (2,000 Units total) by mouth daily. 02/23/15  Yes Dorena Dew, FNP  enoxaparin (LOVENOX) 120 MG/0.8ML injection Inject 0.8 mLs (120 mg total) into the skin daily. 12/03/14  Yes [name], MD  folic acid (FOLVITE) 1 MG tablet Take 1 tablet (1 mg total) by mouth every morning. 02/23/15  Yes Dorena Dew, FNP  HYDROmorphone (DILAUDID) 4 MG tablet Take 1 tablet (4 mg total) by mouth every 4 (four) hours as  needed for severe pain. 03/03/15  Yes Dorena Dew, FNP  hydroxyurea (HYDREA) 500 MG capsule Take 1 capsule (500 mg total) by mouth daily. May take with food to minimize GI side effects. 11/06/14  Yes Dorena Dew, FNP  lisinopril (PRINIVIL,ZESTRIL) 10 MG tablet Take 1 tablet (10 mg total) by mouth daily. 01/22/14  Yes [name], MD  metoprolol succinate (TOPROL-XL) 50 MG 24 hr tablet Take 1 tablet (50 mg total) by mouth daily. 02/23/15  Yes Dorena Dew, FNP  morphine (MS CONTIN) 30 MG 12 hr tablet Take 1 tablet (30 mg total) by mouth every 12 (twelve) hours. 03/03/15  Yes Dorena Dew, FNP  potassium chloride SA (K-DUR,KLOR-CON) 20 MEQ tablet Take 1 tablet (20 mEq total) by mouth every morning. 02/23/15  Yes Dorena Dew, FNP  zolpidem (AMBIEN) 10 MG tablet Take 1 tablet (10 mg total) by mouth at bedtime as needed for sleep. 03/03/15  Yes Dorena Dew, FNP  cyclobenzaprine (FLEXERIL) 5 MG tablet Take 1 tablet (5 mg total) by mouth 3 (three) times daily as needed for muscle spasms. Patient not taking: Reported on 03/06/2015 02/19/15   Alfonzo Beers, MD   BP 119/75 mmHg  Pulse 95  Temp(Src) 98.3 F (36.8 C) (Oral)  Resp 23  SpO2 95% Physical Exam  Constitutional: He is oriented to person, place, and time. He appears well-developed and well-nourished. No distress.  HENT:  Head: Normocephalic and atraumatic.  Eyes: Conjunctivae are normal.  Neck: Neck supple. No tracheal deviation present.  Cardiovascular: Normal rate, regular rhythm and normal heart sounds.   Pulmonary/Chest: Effort normal and breath sounds normal. No respiratory distress. He exhibits no tenderness.  Abdominal: Soft. He exhibits no distension.  Neurological: He is alert and oriented to person, place, and time.  Skin: Skin is warm and dry.  Psychiatric: He has a normal mood and affect.  Vitals reviewed.   ED Course  Procedures (including critical care time) Labs Review Labs Reviewed   RETICULOCYTES - Abnormal; Notable for the following:    Retic Ct Pct 11.6 (*)    RBC. 2.56 (*)    Retic Count, Manual 297.0 (*)    All other components within normal limits  BASIC METABOLIC PANEL - Abnormal; Notable for the following:    Sodium 134 (*)    Glucose, Bld 105 (*)    All other components within normal limits  CBC WITH DIFFERENTIAL/PLATELET - Abnormal; Notable for the following:    WBC 15.1 (*)    RBC 2.56 (*)    Hemoglobin 7.9 (*)    HCT 23.1 (*)    RDW 21.6 (*)    Platelets 550 (*)    nRBC 1 (*)  Neutro Abs 11.1 (*)    Monocytes Absolute 1.7 (*)    All other components within normal limits    Imaging Review Dg Chest 2 View  03/19/2015  CLINICAL DATA:  Sickle cell crisis EXAM: CHEST  2 VIEW COMPARISON:  03/04/2015 FINDINGS: There is mild bilateral interstitial thickening. There is no focal parenchymal opacity. There is no pleural effusion or pneumothorax. There is stable cardiomegaly. There is a left-sided Port-A-Cath in unchanged position. The osseous structures are unremarkable. IMPRESSION: No acute cardiopulmonary disease. Cardiomegaly with mild pulmonary vascular congestion. Electronically Signed   By: Kathreen Devoid   On: 03/19/2015 09:44   I have personally reviewed and evaluated these images and lab results as part of my medical decision-making.   EKG Interpretation None      MDM   Final diagnoses:  Sickle cell anemia with pain [name])    Patient presents with pain typical of sickle cell pain crisis starting yesterday over bilateral posterior lateral chest wall. Home pain meds not working effectively. No shortness of breath, no fevers, no signs of acute chest and Pt otherwise in NAD objectively other than complaint of pain. Patient has history of previous PE but on Lovenox and states he has been compliant with therapy. CBC, Retic count to assess for aplastic crisis. Given IVF and pain medicine with plan to admit vs discharge based on response and results of  testing.   Pt given 2 doses IV narcotics with good relief, requesting extra dose on dc and given po dilaudid at home dose to help him get home. Plan to follow up with PCP as needed and return precautions discussed for worsening or new concerning symptoms.     Leo Grosser, MD 03/19/15 [phone]

## 2015-03-19 NOTE — ED Notes (Signed)
Pt has sickle cell pain.  Started having left/ride side pain yesterday.

## 2015-03-19 NOTE — ED Notes (Signed)
Patient transported to X-ray 

## 2015-03-23 ENCOUNTER — Telehealth (HOSPITAL_COMMUNITY): Payer: Self-pay | Admitting: Hematology

## 2015-03-23 NOTE — Telephone Encounter (Signed)
Patient C/O pain to rbis/back that is 9/10 on pain scale.  Patient denies chest pain, shortness of breath or N/V/D.  Patient also denies abdominal pain and priapism.  Patient states he has taken home medication without improvement. Patient asking to come to [facility].  I explained I would notify the provider and call him.  Patient verbalizes understanding.

## 2015-03-23 NOTE — Telephone Encounter (Signed)
Spoke with [place], NP.  Patient instructed to go to ED for evaluation.  Patient verbalizes understanding.

## 2015-03-24 ENCOUNTER — Ambulatory Visit (INDEPENDENT_AMBULATORY_CARE_PROVIDER_SITE_OTHER): Payer: Medicare Other | Admitting: Family Medicine

## 2015-03-24 VITALS — BP 111/64 | HR 95 | Temp 98.8°F | Resp 16 | Ht 73.0 in | Wt 170.0 lb

## 2015-03-24 DIAGNOSIS — R221 Localized swelling, mass and lump, neck: Secondary | ICD-10-CM

## 2015-03-24 DIAGNOSIS — Z86711 Personal history of pulmonary embolism: Secondary | ICD-10-CM

## 2015-03-24 DIAGNOSIS — D571 Sickle-cell disease without crisis: Secondary | ICD-10-CM

## 2015-03-24 DIAGNOSIS — Z9981 Dependence on supplemental oxygen: Secondary | ICD-10-CM

## 2015-03-24 DIAGNOSIS — I10 Essential (primary) hypertension: Secondary | ICD-10-CM | POA: Diagnosis not present

## 2015-03-24 DIAGNOSIS — E0789 Other specified disorders of thyroid: Secondary | ICD-10-CM

## 2015-03-24 DIAGNOSIS — I272 Other secondary pulmonary hypertension: Secondary | ICD-10-CM

## 2015-03-24 LAB — POCT URINALYSIS DIP (DEVICE)
Bilirubin Urine: NEGATIVE
Glucose, UA: NEGATIVE mg/dL
Hgb urine dipstick: NEGATIVE
Ketones, ur: NEGATIVE mg/dL
Leukocytes, UA: NEGATIVE
NITRITE: NEGATIVE
PH: 6 (ref 5.0–8.0)
PROTEIN: NEGATIVE mg/dL
Specific Gravity, Urine: 1.01 (ref 1.005–1.030)
Urobilinogen, UA: 0.2 mg/dL (ref 0.0–1.0)

## 2015-03-24 LAB — TSH: TSH: 1.29 m[IU]/L (ref 0.40–4.50)

## 2015-03-24 MED ORDER — LISINOPRIL 10 MG PO TABS: 10.0000 mg | ORAL_TABLET | Freq: Every day | ORAL | Status: DC

## 2015-03-24 NOTE — Progress Notes (Signed)
Subjective:    Patient ID: BROADUS POUND, male    DOB: 1980/03/04, 35 y.o.   MRN: [id number]  HPI  Mr. Kaylem [name], a 35 year old male with a history of sickle cell anemia, HbSS presents for a 1 month follow-up.    Patient has also on chronic anticoagulation therapy due to history of pulmonary embolism. He maintains that he is using Lovenox consistently. Mr. Glade reports that he has 6/10 pain primarily to lower extremities that is consistent with sickle cell anemia. He reports that he last had Dilaudid 4 mg this am with moderate relief Patient was evaluated in the day infusion center 1 week ago where he received 1 unit of packed red blood cells for a hemoglobin of 6.1. Hemoglobin increased to 7.9 prior to discharge. Mr. Kammer states that he is taking all prescribed medications consistently.  Patient is currently not followed by hematologist, he has an appointment scheduled for 05/04/2015.   Mr. Mendez denies headache, shortness of breath, chest pains, nausea, vomiting, and diarrhea. Mr. Labarca has a history of pulmonary hypertension.  Mr. Bunkley also has a history of hypoxemia. He is currently on home oxygen therapy. Patient reports that he was issued oxygen tank by [place]. He states that he will only use oxygen at home. He is not carrying an oxygen tank around because it is larger than he is willing to carry around. Immunization History  Administered Date(s) Administered  . Influenza Split 01/15/2012  . Influenza,inj,Quad PF,36+ Mos 11/14/2012, 10/28/2013, 10/27/2014  . Pneumococcal Conjugate-13 07/07/2014  . Pneumococcal Polysaccharide-23 01/15/2012  . Tdap 11/21/2012   Past Medical History  Diagnosis Date  . Sickle cell anemia (HCC)   . Blood transfusion   . Acute embolism and thrombosis of right internal jugular vein ([place])   . Hypokalemia   . Mood disorder ([place])   . History of pulmonary embolus (PE)   . Avascular necrosis (HCC)     Right Hip  . Leukocytosis       Chronic  . Thrombocytosis (HCC)     Chronic  . Hypertension   . History of Clostridium difficile infection   . Uses marijuana   . Chronic anticoagulation   . Functional asplenia   . Former smoker   . Second hand tobacco smoke exposure   . Alcohol consumption of one to four drinks per day   . Noncompliance with medication regimen   . Sickle-cell crisis with associated acute chest syndrome ([place]) 05/13/2013  . Acute chest syndrome ([place]) 06/18/2013  . Demand ischemia ([place]) 01/02/2014  . Pulmonary hypertension ([place])    Social History   Social History  . Marital Status: Single    Spouse Name: N/A  . Number of Children: 0  . Years of Education: 13   Occupational History  . 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 Smoking status: Former Smoker -- 13 years  . Smokeless tobacco: Never Used  . Alcohol Use: No  . Drug Use: 2.00 per week    Special: Marijuana     Comment: Marijuana weekly  . Sexual Activity:    Partners: Female    Museum/gallery curator: None     Comment: month ago   Other Topics Concern  . Not on file   Social History Narrative   Lives in an apartment.  Single.  Lives alone but has a girlfriend that helps care for him.  Does not use any assist devices.        [name]  [phone] Mom, emergency contact      [facility]:   Patient continuing to live in her apartment in town alone. 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  No Known Allergies Review of Systems  Constitutional: Negative for fever, fatigue and unexpected weight change.  HENT: Negative.   Eyes: Negative.   Respiratory: Negative.  Negative for chest tightness and shortness of breath.   Cardiovascular: Negative.  Negative for chest pain, palpitations and leg swelling.  Gastrointestinal: Negative.  Negative for nausea and diarrhea.  Endocrine: Negative.  Negative for polydipsia, polyphagia and polyuria.  Genitourinary:  Negative.   Musculoskeletal: Positive for myalgias (lower extremities).  Skin: Negative.   Allergic/Immunologic: Negative.  Negative for immunocompromised state.  Neurological: Negative.  Negative for dizziness and headaches.  Hematological: Negative.   Psychiatric/Behavioral: Negative.       Objective:   Physical Exam  Constitutional: He is oriented to person, place, and time. He appears well-nourished.  HENT:  Head: Normocephalic and atraumatic.  Right Ear: External ear normal.  Left Ear: External ear normal.  Mouth/Throat: Oropharynx is clear and moist.  Eyes: Conjunctivae and EOM are normal. Pupils are equal, round, and reactive to light.  Neck: Normal range of motion. Neck supple. No thyroid mass present.    Cardiovascular: Intact distal pulses and normal pulses.  An irregularly irregular rhythm present.  Pulmonary/Chest: Effort normal and breath sounds normal. No respiratory distress. He has no decreased breath sounds.  Abdominal: Soft. Bowel sounds are normal.  Musculoskeletal: Normal range of motion.  Neurological: He is alert and oriented to person, place, and time. He has normal reflexes.  Skin: Skin is warm and dry.  Psychiatric: He has a normal mood and affect. His behavior is normal. Judgment and thought content normal.      BP 111/64 mmHg  Pulse 95  Temp(Src) 98.8 F (37.1 C) (Oral)  Resp 16  Ht 6\' 1"  (1.854 m)  Wt 170 lb (77.111 kg)  BMI 22.43 kg/m2  SpO2 92% Assessment & Plan:  1. Hb-SS disease with crisis Patient currently on 1500 mg of hydrea.  Reviewed most recent CBC w/ differential. ANC >2, platelet count > 80K, and hemoglobin is >6.  Reviewed recent CBC w/differential and reticulocyte. Will not increase hydrea at this point. Patient did not follow up with hematologist as scheduled. We discussed the need for good hydration, monitoring of hydration status, avoidance of heat, cold, stress, and infection triggers. We discussed the risks and benefits of  Hydrea, including bone marrow suppression, the possibility of GI upset, skin ulcers, hair thinning, and teratogenicity. The patient was reminded of the need to seek medical attention of any symptoms of bleeding, anemia, or infection. Continue folic acid 1 mg daily to prevent aplastic bone marrow crises.  Hematology appt on May 04, 2015.    Pulmonary evaluation -  Patient was discharged with 3 liters of oxygen during previous hospital visit. He states that he will not wear oxygen in public. Patient denies severe recurrent wheezes, shortness of breath with exercise, or persistent cough. Oxygen saturation is 95% without oxygen.   Cardiac - Abnormal EKG and echocardiogram. Patient has a history of pulmonary hypertension. He has been referred to cardiology previously. Our office called to schedule an appointment. Appointment is 04/28/2015 with Dr. Johnsie Cancel for history of pulmonary hypertension.   Acute and chronic painful episodes - We agreed on continuing MS contin 30  mg every 12 hours.  Will also continue Dilaudid 4 mg every 4 hours as needed for moderate to severe pain. Patient is currently at 156 milli equivalents per day.  We discussed that pt is to receive his Schedule II prescriptions only from [place]. Pt is also aware that the prescription history is available to [place] online through the [facility] CSRS. Controlled substance agreement signed previously. We reminded Mr. Timpson  that all patients receiving Schedule II narcotics must be seen for follow within one month of prescription being requested. We reviewed the terms of our pain agreement, including the need to keep medicines in a safe locked location away from children or pets, and the need to report excess sedation or constipation, measures to avoid constipation, and policies related to early refills and stolen prescriptions. According to the [place] Chronic [facility] Initiative program, we have reviewed details related to analgesia, adverse effects, aberrant behaviors.  Reviewed [place] Substance Reporting system prior to reorder, no inconsistencies noted.    2. Essential hypertension Blood pressure is at goal on current medication regimen.  - lisinopril (PRINIVIL,ZESTRIL) 10 MG tablet; Take 1 tablet (10 mg total) by mouth daily.  Dispense: 30 tablet; Refill: 5  3. Neck nodule 0.5 cm nodule palpated/left neck, round, raised, movable, non tender to palpation.  - US Soft Tissue Head/Neck; Future  4. Thyroid fullness Reviewed previous TSH, within normal limits - TSH  5. Hx of pulmonary embolus . He maintains that he has been using Lovenox consistently. Discussed side effects; specifically signs of bleeding. He expressed understanding.  6. On home oxygen therapy Will continue on 3L of oxygen as previously prescribed.   7. Pulmonary HTN ([place]) Patient to continue Metoprolol 50 mg XL as scheduled. Patient has had referrals to both pulmonology and cardiology. He states that appointments were made on Fridays and he was  able to follow-up. Scheduled appt with Dr. Jenkins Rouge on 04/28/2015 at 4:15, patient expressed understanding.    RTC: 1 month for sickle cell anemia and medication management The patient was given clear instructions to go to ER or return to medical center if symptoms do not improve, worsen or new problems develop. The patient verbalized understanding. Will notify patient with laboratory results. Dorena Dew, FNP

## 2015-03-25 ENCOUNTER — Encounter (HOSPITAL_COMMUNITY): Payer: Self-pay | Admitting: *Deleted

## 2015-03-25 ENCOUNTER — Telehealth (HOSPITAL_COMMUNITY): Payer: Self-pay | Admitting: *Deleted

## 2015-03-25 ENCOUNTER — Non-Acute Institutional Stay (HOSPITAL_COMMUNITY)
Admission: AD | Admit: 2015-03-25 | Discharge: 2015-03-25 | Disposition: A | Discharge status: Home / Self Care | Payer: Medicare Other | Source: Ambulatory Visit | Attending: Internal Medicine | Admitting: Internal Medicine

## 2015-03-25 ENCOUNTER — Encounter: Payer: Self-pay | Admitting: Family Medicine

## 2015-03-25 DIAGNOSIS — D5700 Hb-SS disease with crisis, unspecified: Secondary | ICD-10-CM | POA: Insufficient documentation

## 2015-03-25 DIAGNOSIS — Z7982 Long term (current) use of aspirin: Secondary | ICD-10-CM | POA: Diagnosis not present

## 2015-03-25 DIAGNOSIS — D473 Essential (hemorrhagic) thrombocythemia: Secondary | ICD-10-CM | POA: Diagnosis not present

## 2015-03-25 DIAGNOSIS — Z9049 Acquired absence of other specified parts of digestive tract: Secondary | ICD-10-CM | POA: Diagnosis not present

## 2015-03-25 DIAGNOSIS — I10 Essential (primary) hypertension: Secondary | ICD-10-CM | POA: Diagnosis not present

## 2015-03-25 DIAGNOSIS — Z79899 Other long term (current) drug therapy: Secondary | ICD-10-CM | POA: Insufficient documentation

## 2015-03-25 DIAGNOSIS — Z87891 Personal history of nicotine dependence: Secondary | ICD-10-CM | POA: Diagnosis not present

## 2015-03-25 DIAGNOSIS — Z96641 Presence of right artificial hip joint: Secondary | ICD-10-CM | POA: Insufficient documentation

## 2015-03-25 DIAGNOSIS — Z86711 Personal history of pulmonary embolism: Secondary | ICD-10-CM | POA: Diagnosis not present

## 2015-03-25 DIAGNOSIS — D72829 Elevated white blood cell count, unspecified: Secondary | ICD-10-CM | POA: Insufficient documentation

## 2015-03-25 DIAGNOSIS — Z79891 Long term (current) use of opiate analgesic: Secondary | ICD-10-CM | POA: Diagnosis not present

## 2015-03-25 DIAGNOSIS — Z7901 Long term (current) use of anticoagulants: Secondary | ICD-10-CM | POA: Diagnosis not present

## 2015-03-25 DIAGNOSIS — I272 Other secondary pulmonary hypertension: Secondary | ICD-10-CM | POA: Insufficient documentation

## 2015-03-25 DIAGNOSIS — Z86718 Personal history of other venous thrombosis and embolism: Secondary | ICD-10-CM | POA: Diagnosis not present

## 2015-03-25 DIAGNOSIS — D571 Sickle-cell disease without crisis: Secondary | ICD-10-CM | POA: Diagnosis present

## 2015-03-25 LAB — COMPREHENSIVE METABOLIC PANEL
ALBUMIN: 4.2 g/dL (ref 3.5–5.0)
ALK PHOS: 87 U/L (ref 38–126)
ALT: 28 U/L (ref 17–63)
AST: 44 U/L — AB (ref 15–41)
Anion gap: 6 (ref 5–15)
BUN: 20 mg/dL (ref 6–20)
CALCIUM: 8.8 mg/dL — AB (ref 8.9–10.3)
CHLORIDE: 108 mmol/L (ref 101–111)
CO2: 22 mmol/L (ref 22–32)
CREATININE: 0.73 mg/dL (ref 0.61–1.24)
GFR calc non Af Amer: >60 mL/min (ref >60)
GLUCOSE: 98 mg/dL (ref 65–99)
Potassium: 5 mmol/L (ref 3.5–5.1)
SODIUM: 136 mmol/L (ref 135–145)
Total Bilirubin: 2.8 mg/dL — ABNORMAL HIGH (ref 0.3–1.2)
Total Protein: 7.4 g/dL (ref 6.5–8.1)

## 2015-03-25 LAB — CBC WITH DIFFERENTIAL/PLATELET
BASOS ABS: 0.1 10*3/uL (ref 0.0–0.1)
BASOS PCT: 1 %
EOS ABS: 0.4 10*3/uL (ref 0.0–0.7)
Eosinophils Relative: 3 %
HCT: 18 % — ABNORMAL LOW (ref 39.0–52.0)
HEMOGLOBIN: 6 g/dL — AB (ref 13.0–17.0)
LYMPHS PCT: 15 %
Lymphs Abs: 2.1 10*3/uL (ref 0.7–4.0)
MCH: 30 pg (ref 26.0–34.0)
MCHC: 33.3 g/dL (ref 30.0–36.0)
MCV: 90 fL (ref 78.0–100.0)
MONO ABS: 2.1 10*3/uL — AB (ref 0.1–1.0)
Monocytes Relative: 15 %
NEUTROS PCT: 66 %
Neutro Abs: 9.2 10*3/uL — ABNORMAL HIGH (ref 1.7–7.7)
PLATELETS: 473 10*3/uL — AB (ref 150–400)
RBC: 2 MIL/uL — AB (ref 4.22–5.81)
RDW: 20.7 % — ABNORMAL HIGH (ref 11.5–15.5)
WBC: 13.9 10*3/uL — AB (ref 4.0–10.5)

## 2015-03-25 LAB — RETICULOCYTES
RBC.: 2 MIL/uL — AB (ref 4.22–5.81)
Retic Count, Absolute: 116 10*3/uL (ref 19.0–186.0)
Retic Ct Pct: 5.8 % — ABNORMAL HIGH (ref 0.4–3.1)

## 2015-03-25 MED ORDER — KETOROLAC TROMETHAMINE 30 MG/ML IJ SOLN
30.0000 mg | Freq: Once | INTRAMUSCULAR | Status: AC
  Administered 2015-03-25: 30 mg via INTRAVENOUS
  Filled 2015-03-25: qty 1

## 2015-03-25 MED ORDER — SODIUM CHLORIDE 0.9 % IV SOLN
12.5000 mg | Freq: Four times a day (QID) | INTRAVENOUS | Status: DC | PRN
  Filled 2015-03-25: qty 0.25

## 2015-03-25 MED ORDER — DIPHENHYDRAMINE HCL 12.5 MG/5ML PO ELIX: 12.5000 mg | ORAL_SOLUTION | Freq: Four times a day (QID) | ORAL | Status: DC | PRN

## 2015-03-25 MED ORDER — HYDROMORPHONE HCL 4 MG PO TABS: 4.0000 mg | ORAL_TABLET | Freq: Once | ORAL | Status: DC

## 2015-03-25 MED ORDER — SODIUM CHLORIDE 0.9% FLUSH: 9.0000 mL | INTRAVENOUS | Status: DC | PRN

## 2015-03-25 MED ORDER — DEXTROSE-NACL 5-0.45 % IV SOLN
INTRAVENOUS | Status: DC
  Administered 2015-03-25: 10:00:00 via INTRAVENOUS

## 2015-03-25 MED ORDER — HEPARIN SOD (PORK) LOCK FLUSH 100 UNIT/ML IV SOLN
500.0000 [IU] | INTRAVENOUS | Status: AC | PRN
  Administered 2015-03-25: 500 [IU]

## 2015-03-25 MED ORDER — SODIUM CHLORIDE 0.9% FLUSH
10.0000 mL | INTRAVENOUS | Status: AC | PRN
  Administered 2015-03-25: 10 mL

## 2015-03-25 MED ORDER — HYDROMORPHONE 1 MG/ML IV SOLN
INTRAVENOUS | Status: DC
  Administered 2015-03-25: 10:00:00 via INTRAVENOUS
  Administered 2015-03-25: 12 mg via INTRAVENOUS
  Filled 2015-03-25: qty 25

## 2015-03-25 MED ORDER — NALOXONE HCL 0.4 MG/ML IJ SOLN: 0.4000 mg | INTRAMUSCULAR | Status: DC | PRN

## 2015-03-25 MED ORDER — ONDANSETRON HCL 4 MG/2ML IJ SOLN: 4.0000 mg | Freq: Four times a day (QID) | INTRAMUSCULAR | Status: DC | PRN

## 2015-03-25 NOTE — H&P (Signed)
[facility] History and Physical   Date: 03/25/2015  Patient name: [name] Medical record number: [id number] Date of birth: 11-16-1980 Age: 35 y.o. Gender: male PCP: [name], MD  Attending physician: Tresa Garter, MD  Chief Complaint: Left flank pain  History of Present Illness: [name], a 35 year old male with a history of sickle cell anemia, HbSS presents complaining of left flank pain that is consistent with sickle cell anemia. He maintains that pain started several days ago and has not been controlled on home medication regimen since that time. Current pain intensity is 9/10 described as constant and throbbing. He last had Dilaudid 4 mg around 6 am with minimal relief. He is taking all other medications consistently. He denies headache, chest pains, fatigue, dysuria, nausea, vomiting, or diarrhea.  Meds: Prescriptions prior to admission  Medication Sig Dispense Refill Last Dose  . aspirin 81 MG chewable tablet Chew 1 tablet (81 mg total) by mouth daily. 30 tablet 11 03/24/2015 at Unknown time  . Cholecalciferol (VITAMIN D) 2000 units tablet Take 1 tablet (2,000 Units total) by mouth daily. 90 tablet 3 03/24/2015 at Unknown time  . enoxaparin (LOVENOX) 120 MG/0.8ML injection Inject 0.8 mLs (120 mg total) into the skin daily. 30 Syringe 2 03/24/2015 at Unknown time  . folic acid (FOLVITE) 1 MG tablet Take 1 tablet (1 mg total) by mouth every morning. 30 tablet 11 03/24/2015 at Unknown time  . HYDROmorphone (DILAUDID) 4 MG tablet Take 1 tablet (4 mg total) by mouth every 4 (four) hours as needed for severe pain. 90 tablet 0 03/25/2015 at Unknown time  . hydroxyurea (HYDREA) 500 MG capsule Take 1 capsule (500 mg total) by mouth daily. May take with food to minimize GI side effects. 30 capsule 0 03/24/2015 at Unknown time  . lisinopril (PRINIVIL,ZESTRIL) 10 MG tablet Take 1 tablet (10 mg total) by mouth daily. 30 tablet 5 03/24/2015 at Unknown  time  . metoprolol succinate (TOPROL-XL) 50 MG 24 hr tablet Take 1 tablet (50 mg total) by mouth daily. 30 tablet 3 03/24/2015 at Unknown time  . morphine (MS CONTIN) 30 MG 12 hr tablet Take 1 tablet (30 mg total) by mouth every 12 (twelve) hours. 60 tablet 0 03/25/2015 at Unknown time  . potassium chloride SA (K-DUR,KLOR-CON) 20 MEQ tablet Take 1 tablet (20 mEq total) by mouth every morning. 30 tablet 3 03/24/2015 at Unknown time  . zolpidem (AMBIEN) 10 MG tablet Take 1 tablet (10 mg total) by mouth at bedtime as needed for sleep. 30 tablet 0 Past Week at Unknown time  . cyclobenzaprine (FLEXERIL) 5 MG tablet Take 1 tablet (5 mg total) by mouth 3 (three) times daily as needed for muscle spasms. (Patient not taking: Reported on 03/06/2015) 20 tablet 0 More than a month at Unknown time    Allergies: Review of patient's allergies indicates no known allergies. Past Medical History  Diagnosis Date  . Sickle cell anemia (HCC)   . Blood transfusion   . Acute embolism and thrombosis of right internal jugular vein ([place])   . Hypokalemia   . Mood disorder ([place])   . History of pulmonary embolus (PE)   . Avascular necrosis (HCC)     Right Hip  . Leukocytosis     Chronic  . Thrombocytosis (HCC)     Chronic  . Hypertension   . History of Clostridium difficile infection   . Uses marijuana   . Chronic anticoagulation   .  Functional asplenia   . Former smoker   . Second hand tobacco smoke exposure   . Alcohol consumption of one to four drinks per day   . Noncompliance with medication regimen   . Sickle-cell crisis with associated acute chest syndrome ([place]) 05/13/2013  . Acute chest syndrome ([place]) 06/18/2013  . Demand ischemia ([place]) 01/02/2014  . Pulmonary hypertension [facility])    Past Surgical History  Procedure Laterality Date  . Right hip replacement      08/2006  . Cholecystectomy      01/2008  . Porta cath placement    . Porta cath removal    . Umbilical hernia repair      01/2008  . Excision  of left periauricular cyst      10/2009  . Excision of right ear lobe cyst with primary closur      11/2007  . Portacath placement  01/05/2012    Procedure: INSERTION PORT-A-CATH;  Surgeon: Odis Hollingshead, MD;  Location: [facility];  Service: General;  Laterality: N/A;  ultrasound guiced port a cath insertion with fluoroscopy   Family History  Problem Relation Age of Onset  . Sickle cell trait Mother   . Depression Mother   . Diabetes Mother   . Sickle cell trait Father   . Sickle cell trait Brother    Social History   Social History  . Marital Status: Single    Spouse Name: N/A  . Number of Children: 0  . Years of Education: 13   Occupational History  . 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 Smoking status: Former Smoker -- 13 years  . Smokeless tobacco: Never Used  . Alcohol Use: No  . Drug Use: 2.00 per week    Special: Marijuana     Comment: Marijuana weekly  . Sexual Activity:    Partners: Female    Museum/gallery curator: None     Comment: month ago   Other Topics Concern  . Not on file   Social History Narrative   Lives in an apartment.  Single.  Lives alone but has a girlfriend that helps care for him.  Does not use any assist devices.        [name]  [phone] Mom, emergency contact      [facility]:   Patient continuing to live in her apartment in town alone. 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    Review of Systems: Eyes: positive for icterus Ears, nose, mouth, throat, and face: negative Respiratory: positive for dyspnea on exertion, negative for emphysema, sputum and wheezing Cardiovascular: negative Gastrointestinal: negative for abdominal pain, constipation and dysphagia Genitourinary:positive for dysuria and frequency Integument/breast: negative Hematologic/lymphatic: negative Musculoskeletal:positive for bone pain and myalgias Neurological: negative Behavioral/Psych:  negative Endocrine: negative  Physical Exam: Blood pressure 111/63, pulse 92, temperature 98.8 F (37.1 C), temperature source Oral, resp. rate 18, height 6\' 1"  (1.854 m), weight 170 lb (77.111 kg), SpO2 93 %.   General Appearance:    Alert, cooperative, mild distress, appears stated age  Head:    Normocephalic, without obvious abnormality, atraumatic  Eyes:    PERRL, conjunctiva/corneas clear, EOM's intact, fundi    benign, scleral icterus      Ears:    Normal TM's and external ear canals, both ears  Nose:   Nares normal, septum midline, mucosa normal, no drainage    or sinus tenderness  Throat:   Lips, mucosa, and tongue normal; teeth and gums normal  Neck:   Supple, symmetrical, trachea midline, no adenopathy;       thyroid:  No enlargement/tenderness/nodules; no carotid   bruit or JVD  Back:     Symmetric, no curvature, ROM normal, no CVA tenderness  Lungs:     Clear to auscultation bilaterally, respirations unlabored  Chest wall:    No tenderness or deformity  Heart:    Regular rate and rhythm, S1 and S2 normal, no murmur, rub   or gallop  Extremities:   Extremities normal, atraumatic, no cyanosis or edema  Pulses:   2+ and symmetric all extremities  Skin:   Skin color, texture, turgor normal, no rashes or lesions  Lymph nodes:   Cervical, supraclavicular, and axillary nodes normal  Neurologic:   CNII-XII intact. Normal strength, sensation and reflexes      throughout    Lab results: Results for orders placed or performed in visit on 03/24/15 (from the past 24 hour(s))  TSH     Status: None   Collection Time: 03/24/15  2:18 PM  Result Value Ref Range   TSH 1.29 0.40 - 4.50 mIU/L   Narrative   Performed at:  [facility], [id number]                [place], [place] [address]  POCT urinalysis dip (device)     Status: None   Collection Time: 03/24/15  2:26 PM  Result Value Ref Range   Glucose, UA NEGATIVE NEGATIVE mg/dL   Bilirubin Urine  NEGATIVE NEGATIVE   Ketones, ur NEGATIVE NEGATIVE mg/dL   Specific Gravity, Urine 1.010 1.005 - 1.030   Hgb urine dipstick NEGATIVE NEGATIVE   pH 6.0 5.0 - 8.0   Protein, ur NEGATIVE NEGATIVE mg/dL   Urobilinogen, UA 0.2 0.0 - 1.0 mg/dL   Nitrite NEGATIVE NEGATIVE   Leukocytes, UA NEGATIVE NEGATIVE   *Note: Due to a large number of results and/or encounters for the requested time period, some results have not been displayed. A complete set of results can be found in Results Review.    Imaging results:  No results found. Assessment & Plan:  Patient will be admitted to the day infusion center for extended observation  Start IV D5.45 for cellular rehydration at 125/hr  Start Toradol 30 mg IV times 37for inflammation.  Start Dilaudid PCA High Concentration per weight based protocol.   Patient will be re-evaluated for pain intensity in the context of function and relationship to          baseline as care progresses.  If no significant pain relief, will transfer patient to inpatient services for a higher level of care.   Will check CMP,  Retiiculocytes, and CBC w/differential  Start oxygen via n/c at 2 liters per home dose  Lurlene Ronda M 03/25/2015, 9:52 AM

## 2015-03-25 NOTE — Telephone Encounter (Signed)
Pt called with complains of 9/1- pain in ribs and back. Home prescribed meds were taken : dilaudid and mscontin, with no relieve. Pt denies CP, N/V/D, priapism. [place] notified and pt is OK to come in the [facility] called back and advised to come in. Julias Mould, Eustaquio Maize

## 2015-03-25 NOTE — Discharge Summary (Signed)
[facility] Discharge Summary   Patient ID: [name] MRN: [id number] DOB/AGE: Dec 03, 1980 35 y.o.  Admit date: 03/25/2015 Discharge date: 03/25/2015  Primary Care Physician:  [name], MD  Admission Diagnoses:  Active Problems:   Hb-SS disease with crisis [facility])  Discharge Medications:    Medication List    ASK your doctor about these medications        aspirin 81 MG chewable tablet  Chew 1 tablet (81 mg total) by mouth daily.     cyclobenzaprine 5 MG tablet  Commonly known as:  FLEXERIL  Take 1 tablet (5 mg total) by mouth 3 (three) times daily as needed for muscle spasms.     enoxaparin 120 MG/0.8ML injection  Commonly known as:  LOVENOX  Inject 0.8 mLs (120 mg total) into the skin daily.     folic acid 1 MG tablet  Commonly known as:  FOLVITE  Take 1 tablet (1 mg total) by mouth every morning.     HYDROmorphone 4 MG tablet  Commonly known as:  DILAUDID  Take 1 tablet (4 mg total) by mouth every 4 (four) hours as needed for severe pain.     hydroxyurea 500 MG capsule  Commonly known as:  HYDREA  Take 1 capsule (500 mg total) by mouth daily. May take with food to minimize GI side effects.     lisinopril 10 MG tablet  Commonly known as:  PRINIVIL,ZESTRIL  Take 1 tablet (10 mg total) by mouth daily.     metoprolol succinate 50 MG 24 hr tablet  Commonly known as:  TOPROL-XL  Take 1 tablet (50 mg total) by mouth daily.     morphine 30 MG 12 hr tablet  Commonly known as:  MS CONTIN  Take 1 tablet (30 mg total) by mouth every 12 (twelve) hours.     potassium chloride SA 20 MEQ tablet  Commonly known as:  K-DUR,KLOR-CON  Take 1 tablet (20 mEq total) by mouth every morning.     Vitamin D 2000 units tablet  Take 1 tablet (2,000 Units total) by mouth daily.     zolpidem 10 MG tablet  Commonly known as:  AMBIEN  Take 1 tablet (10 mg total) by mouth at bedtime as needed for sleep.         Consults:  None  Significant  Diagnostic Studies:  Dg Chest 2 View  03/19/2015  CLINICAL DATA:  Sickle cell crisis EXAM: CHEST  2 VIEW COMPARISON:  03/04/2015 FINDINGS: There is mild bilateral interstitial thickening. There is no focal parenchymal opacity. There is no pleural effusion or pneumothorax. There is stable cardiomegaly. There is a left-sided Port-A-Cath in unchanged position. The osseous structures are unremarkable. IMPRESSION: No acute cardiopulmonary disease. Cardiomegaly with mild pulmonary vascular congestion. Electronically Signed   By: Kathreen Devoid   On: 03/19/2015 09:44   Dg Chest 2 View  03/04/2015  CLINICAL DATA:  Sickle cell pain, crisis, left-sided chest pain EXAM: CHEST  2 VIEW COMPARISON:  02/25/2015 FINDINGS: There is no focal consolidation. There is mild bilateral interstitial thickening. There is no pleural effusion or pneumothorax. There is stable cardiomegaly. There is a left-sided Port-A-Cath in satisfactory position. The osseous structures are unremarkable. IMPRESSION: Cardiomegaly with mild pulmonary vascular congestion. Electronically Signed   By: Kathreen Devoid   On: 03/04/2015 09:46   Dg Chest 2 View  02/25/2015  CLINICAL DATA:  Sickle cell crisis, pain in ribs and legs, generalized headache, former smoker, leukocytosis EXAM: CHEST  2 VIEW COMPARISON:  01/31/2015 FINDINGS: LEFT subclavian Port-A-Cath with tip projecting over SVC near cavoatrial junction. Enlargement of cardiac silhouette with pulmonary vascular congestion. Mediastinal contour stable. Increased opacity in RIGHT upper lobe suspicious for pneumonia. Remaining lungs clear. Mild chronic central peribronchial thickening. No pleural effusion or pneumothorax. Osseous structures stable IMPRESSION: Enlargement of cardiac silhouette with pulmonary vascular congestion. Question developing RIGHT upper lobe infiltrate. Electronically Signed   By: Lavonia Dana M.D.   On: 02/25/2015 10:49     [facility] Course: [name] was  admitted to the [facility]le cell medical center for extended observation.  He had a critical hemoglobin of 6.1, which was decreased from 7.9 six days ago.  Patient was having 8-9/10 pain on admission.  Patient was also started on on high concentration PCA dilaudid, he used a total of 12 mg with  30 demands and  30 deliveries. Pain intensity decreased to 4/10 prior to discharge.  Patient is alert, oriented, and ambulating. He states that he can manage at home on current medication regimen. He was reminded to follow up in office as scheduled and has recently had a referral sent to hematologist and scheduled appt with cardiologist to establish care.  Physical Exam at Discharge:    BP 109/69 mmHg  Pulse 76  Temp(Src) 98.8 F (37.1 C) (Oral)  Resp 18  Ht 6\' 1"  (1.854 m)  Wt 170 lb (77.111 kg)  BMI 22.43 kg/m2  SpO2 95%  General Appearance:    Alert, cooperative, no distress, appears stated age  Head:    Normocephalic, without obvious abnormality, atraumatic  Eyes:    PERRL, conjunctiva/corneas clear, EOM's intact, fundi    benign, both eyes       Lungs:     Clear to auscultation bilaterally, respirations unlabored  Chest wall:    No tenderness or deformity  Heart:    Irregular rate and rhythm, S1 and S2 normal, no murmur, rub   or gallop  Abdomen:     Soft, non-tender, bowel sounds active all four quadrants,    no masses, no organomegaly  Extremities:   Extremities normal, atraumatic, no cyanosis or edema  Pulses:   2+ and symmetric all extremities  Skin:   Skin color, texture, turgor normal, no rashes or lesions  Lymph nodes:   Cervical, supraclavicular, and axillary nodes normal  Neurologic:   CNII-XII intact. Normal strength, sensation and reflexes      throughout    Disposition at Discharge: 01-Home or Self Care  Discharge Orders:   Condition at Discharge:   Stable  Time spent on Discharge:  15 minutes  Signed: [name] M 03/25/2015, 4:00 PM

## 2015-03-25 NOTE — Patient Instructions (Signed)
Sickle Cell Anemia, Adult Sickle cell anemia is a condition in which red blood cells have an abnormal "sickle" shape. This abnormal shape shortens the cells' life span, which results in a lower than normal concentration of red blood cells in the blood. The sickle shape also causes the cells to clump together and block free blood flow through the blood vessels. As a result, the tissues and organs of the body do not receive enough oxygen. Sickle cell anemia causes organ damage and pain and increases the risk of infection. CAUSES  Sickle cell anemia is a genetic disorder. Those who receive two copies of the gene have the condition, and those who receive one copy have the trait. RISK FACTORS The sickle cell gene is most common in people whose families originated in [place]. Other areas of the globe where sickle cell trait occurs include the Mediterranean, [place] and [place], the [place], and the [place].  SIGNS AND SYMPTOMS  Pain, especially in the extremities, back, chest, or abdomen (common). The pain may start suddenly or may develop following an illness, especially if there is dehydration. Pain can also occur due to overexertion or exposure to extreme temperature changes.  Frequent severe bacterial infections, especially certain types of pneumonia and meningitis.  Pain and swelling in the hands and feet.  Decreased activity.   Loss of appetite.   Change in behavior.  Headaches.  Seizures.  Shortness of breath or difficulty breathing.  Vision changes.  Skin ulcers. Those with the trait may not have symptoms or they may have mild symptoms.  DIAGNOSIS  Sickle cell anemia is diagnosed with blood tests that demonstrate the genetic trait. It is often diagnosed during the newborn period, due to mandatory testing nationwide. A variety of blood tests, X-rays, CT scans, MRI scans, ultrasounds, and lung function tests may also be done to monitor the condition. TREATMENT  Sickle  cell anemia may be treated with:  Medicines. You may be given pain medicines, antibiotic medicines (to treat and prevent infections) or medicines to increase the production of certain types of hemoglobin.  Fluids.  Oxygen.  Blood transfusions. HOME CARE INSTRUCTIONS   Drink enough fluid to keep your urine clear or pale yellow. Increase your fluid intake in hot weather and during exercise.  Do not smoke. Smoking lowers oxygen levels in the blood.   Only take over-the-counter or prescription medicines for pain, fever, or discomfort as directed by your health care provider.  Take antibiotics as directed by your health care provider. Make sure you finish them it even if you start to feel better.   Take supplements as directed by your health care provider.   Consider wearing a medical alert bracelet. This tells anyone caring for you in an emergency of your condition.   When traveling, keep your medical information, health care provider's names, and the medicines you take with you at all times.   If you develop a fever, do not take medicines to reduce the fever right away. This could cover up a problem that is developing. Notify your health care provider.  Keep all follow-up appointments with your health care provider. Sickle cell anemia requires regular medical care. SEEK MEDICAL CARE IF: You have a fever. SEEK IMMEDIATE MEDICAL CARE IF:   You feel dizzy or faint.   You have new abdominal pain, especially on the left side near the stomach area.   You develop a persistent, often uncomfortable and painful penile erection (priapism). If this is not treated immediately it   will lead to impotence.   You have numbness your arms or legs or you have a hard time moving them.   You have a hard time with speech.   You have a fever or persistent symptoms for more than 2-3 days.   You have a fever and your symptoms suddenly get worse.   You have signs or symptoms of infection.  These include:   Chills.   Abnormal tiredness (lethargy).   Irritability.   Poor eating.   Vomiting.   You develop pain that is not helped with medicine.   You develop shortness of breath.  You have pain in your chest.   You are coughing up pus-like or bloody sputum.   You develop a stiff neck.  Your feet or hands swell or have pain.  Your abdomen appears bloated.  You develop joint pain. MAKE SURE YOU:  Understand these instructions.   This information is not intended to replace advice given to you by your health care provider. Make sure you discuss any questions you have with your health care provider.   Document Released: 04/27/2005 Document Revised: 02/07/2014 Document Reviewed: 08/29/2012 Elsevier Interactive Patient Education 2016 [employer]  

## 2015-03-25 NOTE — Progress Notes (Signed)
Patient ID: [name], male   DOB: [birth year], 34 y.o.   MRN: [id number] Pt of Dr.Jegede with diagnosis of Sickle cell anemia arrived today to the [facility] with complaints of unrelieved pain in ribs and back. Pain level 9/10, patient stated goal was 3. Port-a-cath was accessed with good blood return noted, fluids started and PCA hydromorphone initiated. Pt tolerated procedure well, no N/V noted, pain gradually declined to level of 4. VS remained stable. Pt a/o, ambulatory upon completion. Pt states his brother is picking him up. Pt discharged to home. Tiny Chaudhary, Eustaquio Maize

## 2015-03-25 NOTE — Progress Notes (Addendum)
CRITICAL VALUE ALERT  Critical value received: 6.1  Date of notification: 03/25/15  Time of notification:  [id number]  Critical value read back: yes  Nurse who received alert: Lucien Mons, RN  MD notified (1st page): yes  Time of first page: 80  MD notified (2nd page):  Time of second page:  Responding MD: Cammie Sickle NP  Time MD responded:[id number]

## 2015-03-30 ENCOUNTER — Encounter (HOSPITAL_COMMUNITY): Payer: Self-pay | Admitting: Family Medicine

## 2015-03-30 ENCOUNTER — Inpatient Hospital Stay (HOSPITAL_COMMUNITY)
Admission: EM | Admit: 2015-03-30 | Discharge: 2015-04-08 | DRG: 811 | Disposition: A | Discharge status: Home / Self Care | Payer: Medicare Other | Attending: Internal Medicine | Admitting: Internal Medicine

## 2015-03-30 DIAGNOSIS — D72829 Elevated white blood cell count, unspecified: Secondary | ICD-10-CM | POA: Diagnosis present

## 2015-03-30 DIAGNOSIS — Z7982 Long term (current) use of aspirin: Secondary | ICD-10-CM

## 2015-03-30 DIAGNOSIS — D599 Acquired hemolytic anemia, unspecified: Secondary | ICD-10-CM | POA: Diagnosis present

## 2015-03-30 DIAGNOSIS — D5700 Hb-SS disease with crisis, unspecified: Principal | ICD-10-CM | POA: Diagnosis present

## 2015-03-30 DIAGNOSIS — G894 Chronic pain syndrome: Secondary | ICD-10-CM | POA: Diagnosis not present

## 2015-03-30 DIAGNOSIS — Z7901 Long term (current) use of anticoagulants: Secondary | ICD-10-CM

## 2015-03-30 DIAGNOSIS — Z87891 Personal history of nicotine dependence: Secondary | ICD-10-CM

## 2015-03-30 DIAGNOSIS — I10 Essential (primary) hypertension: Secondary | ICD-10-CM | POA: Diagnosis not present

## 2015-03-30 DIAGNOSIS — I272 Other secondary pulmonary hypertension: Secondary | ICD-10-CM | POA: Diagnosis present

## 2015-03-30 DIAGNOSIS — J9611 Chronic respiratory failure with hypoxia: Secondary | ICD-10-CM | POA: Diagnosis not present

## 2015-03-30 DIAGNOSIS — Z9049 Acquired absence of other specified parts of digestive tract: Secondary | ICD-10-CM

## 2015-03-30 DIAGNOSIS — D638 Anemia in other chronic diseases classified elsewhere: Secondary | ICD-10-CM

## 2015-03-30 DIAGNOSIS — Z79899 Other long term (current) drug therapy: Secondary | ICD-10-CM

## 2015-03-30 DIAGNOSIS — Z833 Family history of diabetes mellitus: Secondary | ICD-10-CM

## 2015-03-30 DIAGNOSIS — J9621 Acute and chronic respiratory failure with hypoxia: Secondary | ICD-10-CM | POA: Diagnosis present

## 2015-03-30 DIAGNOSIS — Z86711 Personal history of pulmonary embolism: Secondary | ICD-10-CM | POA: Diagnosis not present

## 2015-03-30 DIAGNOSIS — Z96641 Presence of right artificial hip joint: Secondary | ICD-10-CM | POA: Diagnosis present

## 2015-03-30 DIAGNOSIS — N5089 Other specified disorders of the male genital organs: Secondary | ICD-10-CM | POA: Diagnosis not present

## 2015-03-30 DIAGNOSIS — F05 Delirium due to known physiological condition: Secondary | ICD-10-CM | POA: Diagnosis not present

## 2015-03-30 LAB — COMPREHENSIVE METABOLIC PANEL
ALBUMIN: 4.2 g/dL (ref 3.5–5.0)
ALK PHOS: 87 U/L (ref 38–126)
ALT: 31 U/L (ref 17–63)
AST: 33 U/L (ref 15–41)
Anion gap: 8 (ref 5–15)
BUN: 10 mg/dL (ref 6–20)
CALCIUM: 9 mg/dL (ref 8.9–10.3)
CHLORIDE: 109 mmol/L (ref 101–111)
CO2: 23 mmol/L (ref 22–32)
CREATININE: 0.73 mg/dL (ref 0.61–1.24)
GFR calc Af Amer: >60 mL/min (ref >60)
GFR calc non Af Amer: >60 mL/min (ref >60)
GLUCOSE: 95 mg/dL (ref 65–99)
Potassium: 3.9 mmol/L (ref 3.5–5.1)
SODIUM: 140 mmol/L (ref 135–145)
Total Bilirubin: 2.7 mg/dL — ABNORMAL HIGH (ref 0.3–1.2)
Total Protein: 7.5 g/dL (ref 6.5–8.1)

## 2015-03-30 LAB — CBC WITH DIFFERENTIAL/PLATELET
BASOS PCT: 1 %
Basophils Absolute: 0.2 10*3/uL — ABNORMAL HIGH (ref 0.0–0.1)
EOS PCT: 2 %
Eosinophils Absolute: 0.3 10*3/uL (ref 0.0–0.7)
HEMATOCRIT: 19.3 % — AB (ref 39.0–52.0)
HEMOGLOBIN: 6.4 g/dL — AB (ref 13.0–17.0)
LYMPHS ABS: 2.4 10*3/uL (ref 0.7–4.0)
Lymphocytes Relative: 14 %
MCH: 30.6 pg (ref 26.0–34.0)
MCHC: 33.2 g/dL (ref 30.0–36.0)
MCV: 92.3 fL (ref 78.0–100.0)
MONO ABS: 1.9 10*3/uL — AB (ref 0.1–1.0)
Monocytes Relative: 11 %
NRBC: 4 /100{WBCs} — AB
Neutro Abs: 12.5 10*3/uL — ABNORMAL HIGH (ref 1.7–7.7)
Neutrophils Relative %: 72 %
PLATELETS: 569 10*3/uL — AB (ref 150–400)
RBC: 2.09 MIL/uL — ABNORMAL LOW (ref 4.22–5.81)
RDW: 22.9 % — ABNORMAL HIGH (ref 11.5–15.5)
WBC: 17.3 10*3/uL — ABNORMAL HIGH (ref 4.0–10.5)

## 2015-03-30 LAB — LACTATE DEHYDROGENASE: LDH: 345 U/L — AB (ref 98–192)

## 2015-03-30 LAB — RETICULOCYTES
RBC.: 2.16 MIL/uL — ABNORMAL LOW (ref 4.22–5.81)
Retic Count, Absolute: 250.6 10*3/uL — ABNORMAL HIGH (ref 19.0–186.0)
Retic Ct Pct: 11.6 % — ABNORMAL HIGH (ref 0.4–3.1)

## 2015-03-30 LAB — MRSA PCR SCREENING: MRSA BY PCR: NEGATIVE

## 2015-03-30 LAB — MAGNESIUM: MAGNESIUM: 1.9 mg/dL (ref 1.7–2.4)

## 2015-03-30 MED ORDER — NALOXONE HCL 0.4 MG/ML IJ SOLN: 0.4000 mg | INTRAMUSCULAR | Status: DC | PRN

## 2015-03-30 MED ORDER — ENOXAPARIN SODIUM 120 MG/0.8ML ~~LOC~~ SOLN
120.0000 mg | SUBCUTANEOUS | Status: DC
  Administered 2015-03-30 – 2015-04-07 (×9): 120 mg via SUBCUTANEOUS
  Filled 2015-03-30 (×11): qty 0.8

## 2015-03-30 MED ORDER — HYDROMORPHONE HCL 2 MG/ML IJ SOLN
2.0000 mg | INTRAMUSCULAR | Status: AC
  Administered 2015-03-30 (×6): 2 mg via INTRAVENOUS
  Filled 2015-03-30 (×5): qty 1

## 2015-03-30 MED ORDER — HYDROXYUREA 500 MG PO CAPS
500.0000 mg | ORAL_CAPSULE | Freq: Every day | ORAL | Status: DC
  Administered 2015-03-30 – 2015-04-08 (×10): 500 mg via ORAL
  Filled 2015-03-30 (×11): qty 1

## 2015-03-30 MED ORDER — FOLIC ACID 1 MG PO TABS
1.0000 mg | ORAL_TABLET | Freq: Every morning | ORAL | Status: DC
  Administered 2015-03-30 – 2015-04-08 (×10): 1 mg via ORAL
  Filled 2015-03-30 (×10): qty 1

## 2015-03-30 MED ORDER — LISINOPRIL 10 MG PO TABS
10.0000 mg | ORAL_TABLET | Freq: Every day | ORAL | Status: DC
  Administered 2015-03-30 – 2015-04-08 (×10): 10 mg via ORAL
  Filled 2015-03-30 (×11): qty 1

## 2015-03-30 MED ORDER — HYDROMORPHONE 1 MG/ML IV SOLN
INTRAVENOUS | Status: DC
  Administered 2015-03-30: 5.4 mg via INTRAVENOUS
  Administered 2015-03-30: 6 mg via INTRAVENOUS
  Administered 2015-03-30: 23:00:00 via INTRAVENOUS
  Administered 2015-03-30: 8.4 mg via INTRAVENOUS
  Administered 2015-03-30: 3.6 mg via INTRAVENOUS
  Administered 2015-03-31: 4.2 mg via INTRAVENOUS
  Administered 2015-03-31: 4.99 mg via INTRAVENOUS
  Administered 2015-03-31: 3.6 mg via INTRAVENOUS
  Administered 2015-03-31: 5.99 mg via INTRAVENOUS
  Administered 2015-03-31: 5.6 mg via INTRAVENOUS
  Administered 2015-03-31: 21:00:00 via INTRAVENOUS
  Administered 2015-03-31: 4.2 mg via INTRAVENOUS
  Administered 2015-04-01: 10.2 mg via INTRAVENOUS
  Administered 2015-04-01: 12 mg via INTRAVENOUS
  Administered 2015-04-01: 1.8 mg via INTRAVENOUS
  Administered 2015-04-01: 0.6 mg via INTRAVENOUS
  Administered 2015-04-01: 8 mg via INTRAVENOUS
  Administered 2015-04-02: 7.13 mg via INTRAVENOUS
  Administered 2015-04-02: 5.4 mg via INTRAVENOUS
  Administered 2015-04-02: 1.2 mg via INTRAVENOUS
  Administered 2015-04-02: 4.8 mg via INTRAVENOUS
  Administered 2015-04-02: 5.4 mg via INTRAVENOUS
  Administered 2015-04-02 (×2): 2.4 mg via INTRAVENOUS
  Administered 2015-04-03: 19:00:00 via INTRAVENOUS
  Administered 2015-04-03: 3 mg via INTRAVENOUS
  Administered 2015-04-03: 4.8 mg via INTRAVENOUS
  Administered 2015-04-03: 2.4 mg via INTRAVENOUS
  Administered 2015-04-03: 7.2 mg via INTRAVENOUS
  Administered 2015-04-04: 4.8 mg via INTRAVENOUS
  Administered 2015-04-04: 7.2 mg via INTRAVENOUS
  Administered 2015-04-04: 8.4 mg via INTRAVENOUS
  Administered 2015-04-04: 5.4 mg via INTRAVENOUS
  Administered 2015-04-04: 16:00:00 via INTRAVENOUS
  Administered 2015-04-04: 1.8 mg via INTRAVENOUS
  Administered 2015-04-05: 1.2 mg via INTRAVENOUS
  Administered 2015-04-05: 5.4 mg via INTRAVENOUS
  Administered 2015-04-05: 15:00:00 via INTRAVENOUS
  Administered 2015-04-05: 4.8 mg via INTRAVENOUS
  Administered 2015-04-05: 10.8 mg via INTRAVENOUS
  Administered 2015-04-05: 2.39 mg via INTRAVENOUS
  Administered 2015-04-05: 9.6 mg via INTRAVENOUS
  Administered 2015-04-06: 4.2 mg via INTRAVENOUS
  Administered 2015-04-06: 5.4 mg via INTRAVENOUS
  Administered 2015-04-06: 09:00:00 via INTRAVENOUS
  Administered 2015-04-06: 6.6 mg via INTRAVENOUS
  Administered 2015-04-06: 5.4 mg via INTRAVENOUS
  Administered 2015-04-06: 1.8 mg via INTRAVENOUS
  Administered 2015-04-06: 5.4 mg via INTRAVENOUS
  Administered 2015-04-07: 1.8 mg via INTRAVENOUS
  Administered 2015-04-07: 5.4 mg via INTRAVENOUS
  Administered 2015-04-07: 9 mg via INTRAVENOUS
  Administered 2015-04-07: 3 mg via INTRAVENOUS
  Filled 2015-03-30 (×9): qty 25

## 2015-03-30 MED ORDER — MORPHINE SULFATE ER 30 MG PO TBCR
30.0000 mg | EXTENDED_RELEASE_TABLET | Freq: Two times a day (BID) | ORAL | Status: DC
  Administered 2015-03-30 – 2015-04-08 (×19): 30 mg via ORAL
  Filled 2015-03-30 (×19): qty 1

## 2015-03-30 MED ORDER — ZOLPIDEM TARTRATE 10 MG PO TABS
10.0000 mg | ORAL_TABLET | Freq: Every evening | ORAL | Status: DC | PRN
  Administered 2015-03-30 – 2015-04-07 (×9): 10 mg via ORAL
  Filled 2015-03-30 (×9): qty 1

## 2015-03-30 MED ORDER — ONDANSETRON HCL 4 MG/2ML IJ SOLN: 4.0000 mg | Freq: Four times a day (QID) | INTRAMUSCULAR | Status: DC | PRN

## 2015-03-30 MED ORDER — POLYETHYLENE GLYCOL 3350 17 G PO PACK
17.0000 g | PACK | Freq: Every day | ORAL | Status: DC | PRN
  Filled 2015-03-30: qty 1

## 2015-03-30 MED ORDER — HYDROMORPHONE HCL 2 MG/ML IJ SOLN
2.0000 mg | Freq: Once | INTRAMUSCULAR | Status: AC
  Administered 2015-03-30: 2 mg via INTRAVENOUS
  Filled 2015-03-30: qty 1

## 2015-03-30 MED ORDER — DEXTROSE-NACL 5-0.45 % IV SOLN
INTRAVENOUS | Status: DC
  Administered 2015-03-30 – 2015-04-07 (×9): via INTRAVENOUS

## 2015-03-30 MED ORDER — HYDROMORPHONE HCL 2 MG/ML IJ SOLN
2.0000 mg | INTRAMUSCULAR | Status: DC | PRN
  Administered 2015-03-30 – 2015-04-03 (×35): 2 mg via INTRAVENOUS
  Filled 2015-03-30 (×36): qty 1

## 2015-03-30 MED ORDER — METOPROLOL SUCCINATE ER 50 MG PO TB24
50.0000 mg | ORAL_TABLET | Freq: Every day | ORAL | Status: DC
  Administered 2015-03-30 – 2015-04-08 (×10): 50 mg via ORAL
  Filled 2015-03-30 (×11): qty 1

## 2015-03-30 MED ORDER — ASPIRIN 81 MG PO CHEW
81.0000 mg | CHEWABLE_TABLET | Freq: Every day | ORAL | Status: DC
  Administered 2015-03-30 – 2015-04-08 (×10): 81 mg via ORAL
  Filled 2015-03-30 (×10): qty 1

## 2015-03-30 MED ORDER — HYDROMORPHONE 1 MG/ML IV SOLN
INTRAVENOUS | Status: DC
  Administered 2015-03-30: 09:00:00 via INTRAVENOUS
  Filled 2015-03-30: qty 25

## 2015-03-30 MED ORDER — DIPHENHYDRAMINE HCL 25 MG PO CAPS
25.0000 mg | ORAL_CAPSULE | Freq: Four times a day (QID) | ORAL | Status: DC | PRN
  Administered 2015-03-30 – 2015-04-07 (×6): 25 mg via ORAL
  Filled 2015-03-30 (×6): qty 1

## 2015-03-30 MED ORDER — SODIUM CHLORIDE 0.9% FLUSH: 9.0000 mL | INTRAVENOUS | Status: DC | PRN

## 2015-03-30 MED ORDER — KETOROLAC TROMETHAMINE 15 MG/ML IJ SOLN
15.0000 mg | Freq: Once | INTRAMUSCULAR | Status: AC
  Administered 2015-03-30: 15 mg via INTRAVENOUS
  Filled 2015-03-30: qty 1

## 2015-03-30 MED ORDER — SENNOSIDES-DOCUSATE SODIUM 8.6-50 MG PO TABS
1.0000 | ORAL_TABLET | Freq: Two times a day (BID) | ORAL | Status: DC
  Administered 2015-03-30 – 2015-04-08 (×19): 1 via ORAL
  Filled 2015-03-30 (×19): qty 1

## 2015-03-30 MED ORDER — VITAMIN D3 25 MCG (1000 UNIT) PO TABS
2000.0000 [IU] | ORAL_TABLET | Freq: Every day | ORAL | Status: DC
  Administered 2015-03-30 – 2015-04-08 (×10): 2000 [IU] via ORAL
  Filled 2015-03-30 (×21): qty 2

## 2015-03-30 MED ORDER — DIPHENHYDRAMINE HCL 50 MG/ML IJ SOLN
25.0000 mg | Freq: Once | INTRAMUSCULAR | Status: AC
  Administered 2015-03-30: 25 mg via INTRAVENOUS
  Filled 2015-03-30: qty 1

## 2015-03-30 MED ORDER — HYDROMORPHONE HCL 2 MG/ML IJ SOLN
2.0000 mg | Freq: Once | INTRAMUSCULAR | Status: AC
Start: 2015-03-30 — End: 2015-03-30
  Administered 2015-03-30: 2 mg via INTRAVENOUS
  Filled 2015-03-30: qty 1

## 2015-03-30 MED ORDER — KETOROLAC TROMETHAMINE 30 MG/ML IJ SOLN
30.0000 mg | Freq: Four times a day (QID) | INTRAMUSCULAR | Status: AC
  Administered 2015-03-30 – 2015-04-04 (×20): 30 mg via INTRAVENOUS
  Filled 2015-03-30 (×20): qty 1

## 2015-03-30 NOTE — ED Notes (Signed)
Patient reports he is having a sickle cell crisis in his ribs/sides that woke him up at midnight.

## 2015-03-30 NOTE — ED Notes (Signed)
MD made aware of critical hemoglobin 

## 2015-03-30 NOTE — Progress Notes (Signed)
SICKLE CELL SERVICE PROGRESS NOTE  KRISHAY ALLERT [id number] DOB: 08-31-1980 DOA: 03/30/2015 PCP: [name], MD  Assessment/Plan: Principal Problem:   Sickle cell pain crisis ([place]) Active Problems:   Pulmonary HTN (HCC)   Chronic anticoagulation   Leukocytosis   Hx of pulmonary embolus   Essential hypertension   Chronic pain syndrome  1. Hb SS with crisis: Pt reports pain at 7/10. I will continue PCA at adjusted dose and order Toradol. Continue clinician assisted doses and IVF. Re-evaluate effectiveness tomorrow.  2. Leukocytosis: Pt has a leukocytosis without evidence of infection. Will continue to monitor.  3. Anemia of chronic disease: Hb at baseline. Will continue to monitor.  4. Chronic pain 5. Chronic Anticoagulation: Pt on Lovenox for recurrent  PE's.   Code Status: Full Code Family Communication: N/A Disposition Plan: Not yet ready for discharge  [place].  Pager [phone]. If 7PM-7AM, please contact night-coverage.  03/30/2015, 5:05 PM  LOS: 0 days   Interim History: Pt reports pain at 10/10 and localized to ribs,   Consultants:  None  Procedures:  None  Antibiotics:  None    Objective: Filed Vitals:   03/30/15 1220 03/30/15 1242 03/30/15 1407 03/30/15 1604  BP:   105/57   Pulse:   80   Temp:   98.2 F (36.8 C)   TempSrc:   Oral   Resp:  16 19 24   Height:      Weight:      SpO2: 97% 97% 96% 96%   Weight change:   Intake/Output Summary (Last 24 hours) at 03/30/15 1705 Last data filed at 03/30/15 1410  Gross per 24 hour  Intake    480 ml  Output    150 ml  Net    330 ml    General: Alert, awake, oriented x3, in no acute distress.  HEENT: [place]/AT PEERL, EOMI Neck: Trachea midline,  no masses, no thyromegal,y no JVD, no carotid bruit OROPHARYNX:  Moist, No exudate/ erythema/lesions.  Heart: Regular rate and rhythm, without murmurs, rubs, gallops, PMI non-displaced, no heaves or thrills on palpation.  Lungs: Clear to  auscultation, no wheezing or rhonchi noted. No increased vocal fremitus resonant to percussion  Abdomen: Soft, nontender, nondistended, positive bowel sounds, no masses no hepatosplenomegaly noted..  Neuro: No focal neurological deficits noted cranial nerves II through XII grossly intact.  Strength at functional baseline in bilateral upper and lower extremities. Musculoskeletal: No warm swelling or erythema around joints, no spinal tenderness noted. Psychiatric: Patient alert and oriented x3, good insight and cognition, good recent to remote recall.    Data Reviewed: Basic Metabolic Panel:  Recent Labs Lab 03/25/15 0950 03/30/15 0400 03/30/15 1109  NA 136 140  --   K 5.0 3.9  --   CL 108 109  --   CO2 22 23  --   GLUCOSE 98 95  --   BUN 20 10  --   CREATININE 0.73 0.73  --   CALCIUM 8.8* 9.0  --   MG  --   --  1.9   Liver Function Tests:  Recent Labs Lab 03/25/15 0950 03/30/15 0400  AST 44* 33  ALT 28 31  ALKPHOS 87 87  BILITOT 2.8* 2.7*  PROT 7.4 7.5  ALBUMIN 4.2 4.2   No results for input(s): LIPASE, AMYLASE in the last 168 hours. No results for input(s): AMMONIA in the last 168 hours. CBC:  Recent Labs Lab 03/25/15 0950 03/30/15 0400  WBC 13.9* 17.3*  NEUTROABS 9.2*  12.5*  HGB 6.0* 6.4*  HCT 18.0* 19.3*  MCV 90.0 92.3  PLT 473* 569*   Cardiac Enzymes: No results for input(s): CKTOTAL, CKMB, CKMBINDEX, TROPONINI in the last 168 hours. BNP (last 3 results)  Recent Labs  08/29/14 0810 09/10/14 0612 11/30/14 0910  BNP 1117.0* 549.5* 639.7*    ProBNP (last 3 results) No results for input(s): PROBNP in the last 8760 hours.  CBG: No results for input(s): GLUCAP in the last 168 hours.  Recent Results (from the past 240 hour(s))  MRSA PCR Screening     Status: None   Collection Time: 03/30/15 11:00 AM  Result Value Ref Range Status   MRSA by PCR NEGATIVE NEGATIVE Final    Comment:        The GeneXpert MRSA Assay (FDA approved for NASAL  specimens only), is one component of a comprehensive MRSA colonization surveillance program. It is not intended to diagnose MRSA infection nor to guide or monitor treatment for MRSA infections.      Studies: Dg Chest 2 View  03/19/2015  CLINICAL DATA:  Sickle cell crisis EXAM: CHEST  2 VIEW COMPARISON:  03/04/2015 FINDINGS: There is mild bilateral interstitial thickening. There is no focal parenchymal opacity. There is no pleural effusion or pneumothorax. There is stable cardiomegaly. There is a left-sided Port-A-Cath in unchanged position. The osseous structures are unremarkable. IMPRESSION: No acute cardiopulmonary disease. Cardiomegaly with mild pulmonary vascular congestion. Electronically Signed   By: Kathreen Devoid   On: 03/19/2015 09:44   Dg Chest 2 View  03/04/2015  CLINICAL DATA:  Sickle cell pain, crisis, left-sided chest pain EXAM: CHEST  2 VIEW COMPARISON:  02/25/2015 FINDINGS: There is no focal consolidation. There is mild bilateral interstitial thickening. There is no pleural effusion or pneumothorax. There is stable cardiomegaly. There is a left-sided Port-A-Cath in satisfactory position. The osseous structures are unremarkable. IMPRESSION: Cardiomegaly with mild pulmonary vascular congestion. Electronically Signed   By: Kathreen Devoid   On: 03/04/2015 09:46    Scheduled Meds: . aspirin  81 mg Oral Daily  . cholecalciferol  2,000 Units Oral Daily  . enoxaparin  120 mg Subcutaneous Q24H  . folic acid  1 mg Oral q morning - 10a  . HYDROmorphone   Intravenous 6 times per day  .  HYDROmorphone (DILAUDID) injection  2 mg Intravenous Q2H  . hydroxyurea  500 mg Oral Daily  . lisinopril  10 mg Oral Daily  . metoprolol succinate  50 mg Oral Daily  . morphine  30 mg Oral Q12H  . senna-docusate  1 tablet Oral BID   Continuous Infusions: . dextrose 5 % and 0.45% NaCl 75 mL/hr at 03/30/15 1110    Time spent 25 minutes

## 2015-03-30 NOTE — H&P (Addendum)
[facility] History and Physical  [name] [id number] DOB: [birth year] DOA: 03/30/2015  Referring physician: ED physician PCP: [name], MD  Specialists:   Chief Complaint: Pain all over  HPI: [name] is a 35 y.o. male with PMH of sickle cell disease, pulmonary embolism on Lovenox, C. difficile colitis, drug abuse, hypertension, who presents with pain all over.  Pt reports that he has sudden onset, constant pain all over, particularly in his ribs, legs and back this AM. He had mild chest pain, which has resolved. He does not have cough, shortness of breath, fever, chills, abdominal pain, nausea, vomiting, diarrhea, symptoms of UTI, unilateral weakness. He states that he is compliant to Lovenox injection.  In ED, patient was found to have hemoglobin 6.0 on 03/25/15--> 6.4 today, WBC 17.3, temperature normal, no tachycardia, electrolytes and renal function okay, negative chest x-ray for acute abnormalities. Patient is admitted to inpatient for further evaluation treatment.  EKG: Not done in ED, will get one.   Where does patient live?   At home   Can patient participate in ADLs?  Yes  Review of Systems:   General: no fevers, chills, no changes in body weight, has poor appetite, has fatigue HEENT: no blurry vision, hearing changes or sore throat Pulm: no dyspnea, coughing, wheezing CV: no chest pain, no palpitations Abd: no nausea, vomiting, abdominal pain, diarrhea, constipation GU: no dysuria, burning on urination, increased urinary frequency, hematuria  Ext: no leg edema Neuro: no unilateral weakness, numbness, or tingling, no vision change or hearing loss Skin: no rash MSK: pain over ribs, legs and back  Heme: No easy bruising.  Travel history: No recent long distant travel.  Allergy: No Known Allergies  Past Medical History  Diagnosis Date  . Sickle cell anemia (HCC)   . Blood transfusion   . Acute embolism and thrombosis of right  internal jugular vein ([place])   . Hypokalemia   . Mood disorder ([place])   . History of pulmonary embolus (PE)   . Avascular necrosis (HCC)     Right Hip  . Leukocytosis     Chronic  . Thrombocytosis (HCC)     Chronic  . Hypertension   . History of Clostridium difficile infection   . Uses marijuana   . Chronic anticoagulation   . Functional asplenia   . Former smoker   . Second hand tobacco smoke exposure   . Alcohol consumption of one to four drinks per day   . Noncompliance with medication regimen   . Sickle-cell crisis with associated acute chest syndrome ([place]) 05/13/2013  . Acute chest syndrome ([place]) 06/18/2013  . Demand ischemia ([place]) 01/02/2014  . Pulmonary hypertension [facility])     Past Surgical History  Procedure Laterality Date  . Right hip replacement      08/2006  . Cholecystectomy      01/2008  . Porta cath placement    . Porta cath removal    . Umbilical hernia repair      01/2008  . Excision of left periauricular cyst      10/2009  . Excision of right ear lobe cyst with primary closur      11/2007  . Portacath placement  01/05/2012    Procedure: INSERTION PORT-A-CATH;  Surgeon: Odis Hollingshead, MD;  Location: [place];  Service: General;  Laterality: N/A;  ultrasound guiced port a cath insertion with fluoroscopy    Social History:  reports that he has quit smoking. He has never used  smokeless tobacco. He reports that he uses illicit drugs (Marijuana) about twice per week. He reports that he does not drink alcohol.  Family History:  Family History  Problem Relation Age of Onset  . Sickle cell trait Mother   . Depression Mother   . Diabetes Mother   . Sickle cell trait Father   . Sickle cell trait Brother      Prior to Admission medications   Medication Sig Start Date End Date Taking? Authorizing Provider  aspirin 81 MG chewable tablet Chew 1 tablet (81 mg total) by mouth daily. 07/23/14  Yes [name], MD  Cholecalciferol (VITAMIN D) 2000 units tablet  Take 1 tablet (2,000 Units total) by mouth daily. 02/23/15  Yes Dorena Dew, FNP  enoxaparin (LOVENOX) 120 MG/0.8ML injection Inject 0.8 mLs (120 mg total) into the skin daily. 12/03/14  Yes [name], MD  folic acid (FOLVITE) 1 MG tablet Take 1 tablet (1 mg total) by mouth every morning. 02/23/15  Yes Dorena Dew, FNP  HYDROmorphone (DILAUDID) 4 MG tablet Take 1 tablet (4 mg total) by mouth every 4 (four) hours as needed for severe pain. 03/03/15  Yes Dorena Dew, FNP  hydroxyurea (HYDREA) 500 MG capsule Take 1 capsule (500 mg total) by mouth daily. May take with food to minimize GI side effects. 11/06/14  Yes Dorena Dew, FNP  lisinopril (PRINIVIL,ZESTRIL) 10 MG tablet Take 1 tablet (10 mg total) by mouth daily. 03/24/15  Yes Dorena Dew, FNP  metoprolol succinate (TOPROL-XL) 50 MG 24 hr tablet Take 1 tablet (50 mg total) by mouth daily. 02/23/15  Yes Dorena Dew, FNP  morphine (MS CONTIN) 30 MG 12 hr tablet Take 1 tablet (30 mg total) by mouth every 12 (twelve) hours. 03/03/15  Yes Dorena Dew, FNP  potassium chloride SA (K-DUR,KLOR-CON) 20 MEQ tablet Take 1 tablet (20 mEq total) by mouth every morning. 02/23/15  Yes Dorena Dew, FNP  zolpidem (AMBIEN) 10 MG tablet Take 1 tablet (10 mg total) by mouth at bedtime as needed for sleep. 03/03/15  Yes Dorena Dew, FNP  cyclobenzaprine (FLEXERIL) 5 MG tablet Take 1 tablet (5 mg total) by mouth 3 (three) times daily as needed for muscle spasms. Patient not taking: Reported on 03/06/2015 02/19/15   Alfonzo Beers, MD    Physical Exam: Filed Vitals:   03/30/15 0430 03/30/15 0500 03/30/15 0530 03/30/15 0600  BP: 109/72 109/75 108/77 107/79  Pulse: 67 69 69 68  Temp:      TempSrc:      Resp:    16  Height:      Weight:      SpO2: 95% 96% 97% 93%   General: Not in acute distress HEENT:       Eyes: PERRL, EOMI, no scleral icterus.       ENT: No discharge from the ears and nose, no pharynx injection, no  tonsillar enlargement.        Neck: No JVD, no bruit, no mass felt. Heme: No neck lymph node enlargement. Cardiac: S1/S2, RRR, No murmurs, No gallops or rubs. Pulm: No rales, wheezing, rhonchi or rubs. Abd: Soft, nondistended, nontender, no rebound pain, no organomegaly, BS present. Ext: No pitting leg edema bilaterally. 2+DP/PT pulse bilaterally. Musculoskeletal: tenderness over ribs, legs and back  Skin: No rashes.  Neuro: Alert, oriented X3, cranial nerves II-XII grossly intact, moves all extremities normally.  Psych: Patient is not psychotic, no suicidal or hemocidal ideation.  Labs on Admission:  Basic Metabolic Panel:  Recent Labs Lab 03/25/15 0950 03/30/15 0400  NA 136 140  K 5.0 3.9  CL 108 109  CO2 22 23  GLUCOSE 98 95  BUN 20 10  CREATININE 0.73 0.73  CALCIUM 8.8* 9.0   Liver Function Tests:  Recent Labs Lab 03/25/15 0950 03/30/15 0400  AST 44* 33  ALT 28 31  ALKPHOS 87 87  BILITOT 2.8* 2.7*  PROT 7.4 7.5  ALBUMIN 4.2 4.2   No results for input(s): LIPASE, AMYLASE in the last 168 hours. No results for input(s): AMMONIA in the last 168 hours. CBC:  Recent Labs Lab 03/25/15 0950 03/30/15 0400  WBC 13.9* 17.3*  NEUTROABS 9.2* 12.5*  HGB 6.0* 6.4*  HCT 18.0* 19.3*  MCV 90.0 92.3  PLT 473* 569*   Cardiac Enzymes: No results for input(s): CKTOTAL, CKMB, CKMBINDEX, TROPONINI in the last 168 hours.  BNP (last 3 results)  Recent Labs  08/29/14 0810 09/10/14 0612 11/30/14 0910  BNP 1117.0* 549.5* 639.7*    ProBNP (last 3 results) No results for input(s): PROBNP in the last 8760 hours.  CBG: No results for input(s): GLUCAP in the last 168 hours.  Radiological Exams on Admission: No results found.  Assessment/Plan Principal Problem:   Sickle cell pain crisis ([place]) Active Problems:   Hx of pulmonary embolus   Pulmonary HTN (HCC)   Chronic anticoagulation   Essential hypertension   Chronic pain syndrome   Leukocytosis   Sickle cell  pain crisis [facility]): Patient has severe pain over ribs, legs and back, which could not be relived by his oral pain med. No signs of infection. Chest x-ray is negative. Hemoglobin stable 6.0--> 6.4. No indication for blood transfusion.  -Admit to Med-surg bed -Start PCA protocol for pain -continue folic acid and hydroxyurea -Benadryl for itch -IVF: D5-1/2NS at 125 cc/h -Zofran for nausea -Check LDH  Hx of pulmonary embolus: on Lovenox. No new issues -continue hold dose sq lovenox  Leukocytosis: no signs of infection. Likely due to stress induced to demargination. -follow up by CBC  HTN: -Continue metoprolol, and lisinopril  DVT ppx: SQ Lovenox  Code Status: Full code Family Communication: None at bed side. Disposition Plan: Admit to inpatient   Date of Service 03/30/2015    Ivor Costa [facility] Pager [phone]  If 7PM-7AM, please contact night-coverage [email or web address] Password [facility] 03/30/2015, 6:29 AM

## 2015-03-30 NOTE — ED Notes (Signed)
MD at bedside. 

## 2015-03-30 NOTE — ED Notes (Addendum)
Report has been given to the floor. It was requested that the patient come up after 720. Pt room changed to 44

## 2015-03-30 NOTE — ED Provider Notes (Signed)
CSN: [id number]     Arrival date & time 03/30/15  0212 History   By signing my name below, I, Nicole Kindred, attest that this documentation has been prepared under the direction and in the presence of Ivor Costa, MD.   Electronically Signed: Nicole Kindred, ED Scribe. 03/30/2015. 6:23 AM   Chief Complaint  Patient presents with  . Sickle Cell Pain Crisis    The history is provided by the patient. No language interpreter was used.   HPI Comments: [name] is a 35 y.o. male who presents to the Emergency Department complaining of sudden onset, constant pain in his ribs, onset earlier tonight about 4 hours ago. Pt reports associated pain in his back and legs. Pt states that the pain is typical os his sickle cell crisis pain in the past. No worsening or alleviating factors noted. Pt denies any other pertinent symptoms. Pt states that he did take medication for pain PTA but had no relief in his symptoms. He reports that was seen in ED about a month ago for similar symptoms.    Past Medical History  Diagnosis Date  . Sickle cell anemia (HCC)   . Blood transfusion   . Acute embolism and thrombosis of right internal jugular vein ([place])   . Hypokalemia   . Mood disorder ([place])   . History of pulmonary embolus (PE)   . Avascular necrosis (HCC)     Right Hip  . Leukocytosis     Chronic  . Thrombocytosis (HCC)     Chronic  . Hypertension   . History of Clostridium difficile infection   . Uses marijuana   . Chronic anticoagulation   . Functional asplenia   . Former smoker   . Second hand tobacco smoke exposure   . Alcohol consumption of one to four drinks per day   . Noncompliance with medication regimen   . Sickle-cell crisis with associated acute chest syndrome ([place]) 05/13/2013  . Acute chest syndrome ([place]) 06/18/2013  . Demand ischemia ([place]) 01/02/2014  . Pulmonary hypertension [facility])    Past Surgical History  Procedure Laterality Date  . Right hip replacement      08/2006   . Cholecystectomy      01/2008  . Porta cath placement    . Porta cath removal    . Umbilical hernia repair      01/2008  . Excision of left periauricular cyst      10/2009  . Excision of right ear lobe cyst with primary closur      11/2007  . Portacath placement  01/05/2012    Procedure: INSERTION PORT-A-CATH;  Surgeon: Odis Hollingshead, MD;  Location: [place];  Service: General;  Laterality: N/A;  ultrasound guiced port a cath insertion with fluoroscopy   Family History  Problem Relation Age of Onset  . Sickle cell trait Mother   . Depression Mother   . Diabetes Mother   . Sickle cell trait Father   . Sickle cell trait Brother    Social History  Substance Use Topics  . Smoking status: Former Smoker -- 13 years  . Smokeless tobacco: Never Used  . Alcohol Use: No    Review of Systems A complete 10 system review of systems was obtained and all systems are negative except as noted in the HPI and PMH.    Allergies  Review of patient's allergies indicates no known allergies.  Home Medications   Prior to Admission medications   Medication Sig Start Date  End Date Taking? Authorizing Provider  aspirin 81 MG chewable tablet Chew 1 tablet (81 mg total) by mouth daily. 07/23/14  Yes [name], MD  Cholecalciferol (VITAMIN D) 2000 units tablet Take 1 tablet (2,000 Units total) by mouth daily. 02/23/15  Yes Dorena Dew, FNP  enoxaparin (LOVENOX) 120 MG/0.8ML injection Inject 0.8 mLs (120 mg total) into the skin daily. 12/03/14  Yes [name], MD  folic acid (FOLVITE) 1 MG tablet Take 1 tablet (1 mg total) by mouth every morning. 02/23/15  Yes Dorena Dew, FNP  HYDROmorphone (DILAUDID) 4 MG tablet Take 1 tablet (4 mg total) by mouth every 4 (four) hours as needed for severe pain. 03/03/15  Yes Dorena Dew, FNP  hydroxyurea (HYDREA) 500 MG capsule Take 1 capsule (500 mg total) by mouth daily. May take with food to minimize GI side effects. 11/06/14  Yes  Dorena Dew, FNP  lisinopril (PRINIVIL,ZESTRIL) 10 MG tablet Take 1 tablet (10 mg total) by mouth daily. 03/24/15  Yes Dorena Dew, FNP  metoprolol succinate (TOPROL-XL) 50 MG 24 hr tablet Take 1 tablet (50 mg total) by mouth daily. 02/23/15  Yes Dorena Dew, FNP  morphine (MS CONTIN) 30 MG 12 hr tablet Take 1 tablet (30 mg total) by mouth every 12 (twelve) hours. 03/03/15  Yes Dorena Dew, FNP  potassium chloride SA (K-DUR,KLOR-CON) 20 MEQ tablet Take 1 tablet (20 mEq total) by mouth every morning. 02/23/15  Yes Dorena Dew, FNP  zolpidem (AMBIEN) 10 MG tablet Take 1 tablet (10 mg total) by mouth at bedtime as needed for sleep. 03/03/15  Yes Dorena Dew, FNP  cyclobenzaprine (FLEXERIL) 5 MG tablet Take 1 tablet (5 mg total) by mouth 3 (three) times daily as needed for muscle spasms. Patient not taking: Reported on 03/06/2015 02/19/15   Alfonzo Beers, MD   BP 137/86 mmHg  Pulse 96  Temp(Src) 98.1 F (36.7 C) (Oral)  Resp 20  Ht 6\' 1"  (1.854 m)  Wt 170 lb (77.111 kg)  BMI 22.43 kg/m2  SpO2 96% Physical Exam  Constitutional: He is oriented to person, place, and time. He appears well-developed and well-nourished.  HENT:  Head: Normocephalic.  Eyes: EOM are normal.  Neck: Normal range of motion.  Pulmonary/Chest: Effort normal.  Abdominal: He exhibits no distension.  Musculoskeletal: Normal range of motion. He exhibits tenderness.  Neurological: He is alert and oriented to person, place, and time.  Skin: No erythema.  Psychiatric: He has a normal mood and affect.  Nursing note and vitals reviewed.   ED Course  Procedures (including critical care time) DIAGNOSTIC STUDIES: Oxygen Saturation is 96% on RA, adequate by my interpretation.    COORDINATION OF CARE: 3:40 AM-Discussed treatment plan which includes toradol, benadryl, CMP, and CBC with differential with pt at bedside and pt agreed to plan.     Labs Review Labs Reviewed  COMPREHENSIVE METABOLIC PANEL  - Abnormal; Notable for the following:    Total Bilirubin 2.7 (*)    All other components within normal limits  CBC WITH DIFFERENTIAL/PLATELET - Abnormal; Notable for the following:    WBC 17.3 (*)    RBC 2.09 (*)    Hemoglobin 6.4 (*)    HCT 19.3 (*)    RDW 22.9 (*)    Platelets 569 (*)    nRBC 4 (*)    Neutro Abs 12.5 (*)    Monocytes Absolute 1.9 (*)    Basophils Absolute 0.2 (*)  All other components within normal limits  RETICULOCYTES - Abnormal; Notable for the following:    Retic Ct Pct 11.6 (*)    RBC. 2.16 (*)    Retic Count, Manual 250.6 (*)    All other components within normal limits  LACTATE DEHYDROGENASE  MAGNESIUM    Imaging Review No results found. I have personally reviewed and evaluated these images and lab results as part of my medical decision-making.   EKG Interpretation None      MDM   Final diagnoses:  Sickle cell pain crisis ([place])    I personally performed the services described in this documentation, which was scribed in my presence. The recorded information has been reviewed and is accurate.  Pt comes in with cc of sickle cell pain. Pt has generalized pain, with no fevers, he is not toxic appearing and there is no signs of joint infection.  Will start pain control.  6:23 AM Unable to get pain in control despite 2 rounds of 2 mg iv dilaudid. Will admit. Hb is low, but seems like he has been lower. Will not transfuse in the ER as he is not symptomatic, will defer.    Varney Biles, MD 03/30/15 [phone]

## 2015-03-31 ENCOUNTER — Ambulatory Visit (HOSPITAL_COMMUNITY): Admission: RE | Admit: 2015-03-31 | Payer: Medicare Other | Source: Ambulatory Visit

## 2015-03-31 LAB — BASIC METABOLIC PANEL
ANION GAP: 8 (ref 5–15)
BUN: 15 mg/dL (ref 6–20)
CALCIUM: 8.7 mg/dL — AB (ref 8.9–10.3)
CHLORIDE: 108 mmol/L (ref 101–111)
CO2: 22 mmol/L (ref 22–32)
Creatinine, Ser: 0.89 mg/dL (ref 0.61–1.24)
GFR calc Af Amer: >60 mL/min (ref >60)
GFR calc non Af Amer: >60 mL/min (ref >60)
GLUCOSE: 137 mg/dL — AB (ref 65–99)
Potassium: 4.3 mmol/L (ref 3.5–5.1)
Sodium: 138 mmol/L (ref 135–145)

## 2015-03-31 LAB — CBC
HEMATOCRIT: 19.5 % — AB (ref 39.0–52.0)
HEMOGLOBIN: 6.5 g/dL — AB (ref 13.0–17.0)
MCH: 31 pg (ref 26.0–34.0)
MCHC: 33.3 g/dL (ref 30.0–36.0)
MCV: 92.9 fL (ref 78.0–100.0)
Platelets: 571 10*3/uL — ABNORMAL HIGH (ref 150–400)
RBC: 2.1 MIL/uL — ABNORMAL LOW (ref 4.22–5.81)
RDW: 21.8 % — AB (ref 11.5–15.5)
WBC: 22 10*3/uL — AB (ref 4.0–10.5)

## 2015-03-31 LAB — DIFFERENTIAL
BAND NEUTROPHILS: 0 %
BASOS PCT: 0 %
Basophils Absolute: 0 10*3/uL (ref 0.0–0.1)
Blasts: 0 %
EOS PCT: 3 %
Eosinophils Absolute: 0.7 10*3/uL (ref 0.0–0.7)
LYMPHS ABS: 2.6 10*3/uL (ref 0.7–4.0)
Lymphocytes Relative: 12 %
METAMYELOCYTES PCT: 0 %
MONO ABS: 1.5 10*3/uL — AB (ref 0.1–1.0)
MYELOCYTES: 0 %
Monocytes Relative: 7 %
NEUTROS ABS: 17.2 10*3/uL — AB (ref 1.7–7.7)
NRBC: 2 /100{WBCs} — AB
Neutrophils Relative %: 78 %
OTHER: 0 %
PROMYELOCYTES ABS: 0 %

## 2015-03-31 LAB — PREPARE RBC (CROSSMATCH)

## 2015-03-31 MED ORDER — SODIUM CHLORIDE 0.9 % IV SOLN: Freq: Once | INTRAVENOUS | Status: DC

## 2015-03-31 MED ORDER — SODIUM CHLORIDE 0.9 % IV SOLN
Freq: Once | INTRAVENOUS | Status: AC
  Administered 2015-03-31: 16:00:00 via INTRAVENOUS

## 2015-03-31 NOTE — Progress Notes (Signed)
SICKLE CELL SERVICE PROGRESS NOTE  [name] [id number] DOB: [birth year] DOA: 03/30/2015 PCP: [name], MD  Assessment/Plan: Principal Problem:   Sickle cell pain crisis ([place]) Active Problems:   Pulmonary HTN (HCC)   Chronic anticoagulation   Leukocytosis   Hx of pulmonary embolus   Essential hypertension   Chronic pain syndrome  1. Hb SS with crisis: Pt reports pain at 8-910. I will continue PCA at adjusted dose and order Toradol. Continue clinician assisted doses and IVF. Re-evaluate effectiveness tomorrow.  2. Acute on chronic respiratory failure with hypoxia: Pt' s saturations have decreased to 89% on 3 L which is a decrease since yesterday. I feel that this is due to the acute hemolysis which is evident by the significant increase in jaundice today.  3. Leukocytosis: Pt has a markedly increased WBC count today without any overt evidence of infection. I feel that this is secondary to bone marrow turnover in response to worsening crisis.  4. 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 Will transfuse 1 unit of RBC's today. 5. Chronic pain: Continue MS Contin 6. Chronic Anticoagulation: Pt on Lovenox for recurrent  PE's.   Code Status: Full Code Family Communication: N/A Disposition Plan: Not yet ready for discharge  [place].  Pager [phone]. If 7PM-7AM, please contact night-coverage.  03/31/2015, 9:56 AM  LOS: 1 day   Interim History: Pt reports pain decreased towards the end of the day yesterday and then escalated overnight and has remained at 9-10/10 since about 3:00am. The pain is mostly localized to his ribs and is sharp and throbbing in nature. He denies any SOB or pleuritic pain but states that his ribs hurt more when using incentive spirometer.    Consultants:  None  Procedures:  None  Antibiotics:  None    Objective: Filed Vitals:   03/31/15 0337 03/31/15 0616 03/31/15 0735  03/31/15 0934  BP:  119/85  128/78  Pulse:  78  84  Temp:  98.4 F (36.9 C)  98.1 F (36.7 C)  TempSrc:  Oral  Oral  Resp: 16 14 14 16   Height:      Weight:  171 lb 9.6 oz (77.837 kg)    SpO2: 96% 95% 93% 92%   Weight change: 0 lb (0 kg)  Intake/Output Summary (Last 24 hours) at 03/31/15 0956 Last data filed at 03/31/15 0935  Gross per 24 hour  Intake 2035.05 ml  Output   1000 ml  Net 1035.05 ml    General: Alert, awake, oriented x3, in moderate distress due to pain.  HEENT: [place]/AT PEERL, EOMI, increased icterus today. Neck: Trachea midline,  no masses, no thyromegal,y no JVD, no carotid bruit OROPHARYNX:  Moist, No exudate/ erythema/lesions.  Heart: Regular rate and rhythm, without murmurs, rubs, gallops, PMI non-displaced, no heaves or thrills on palpation.  Lungs: Clear to auscultation, no wheezing or rhonchi noted. No increased vocal fremitus resonant to percussion  Abdomen: Soft, nontender, nondistended, positive bowel sounds, no masses no hepatosplenomegaly noted..  Neuro: No focal neurological deficits noted cranial nerves II through XII grossly intact.  Strength at functional baseline in bilateral upper and lower extremities. Musculoskeletal: No warm swelling or erythema around joints, no spinal tenderness noted. Psychiatric: Patient alert and oriented x3, good insight and cognition, good recent to remote recall.    Data Reviewed: Basic Metabolic Panel:  Recent Labs Lab 03/25/15 0950 03/30/15 0400 03/30/15 1109 03/31/15 0525  NA 136 140  --  138  K 5.0 3.9  --  4.3  CL 108 109  --  108  CO2 22 23  --  22  GLUCOSE 98 95  --  137*  BUN 20 10  --  15  CREATININE 0.73 0.73  --  0.89  CALCIUM 8.8* 9.0  --  8.7*  MG  --   --  1.9  --    Liver Function Tests:  Recent Labs Lab 03/25/15 0950 03/30/15 0400  AST 44* 33  ALT 28 31  ALKPHOS 87 87  BILITOT 2.8* 2.7*  PROT 7.4 7.5  ALBUMIN 4.2 4.2   No results for input(s): LIPASE, AMYLASE in the last 168  hours. No results for input(s): AMMONIA in the last 168 hours. CBC:  Recent Labs Lab 03/25/15 0950 03/30/15 0400 03/31/15 0525  WBC 13.9* 17.3* 22.0*  NEUTROABS 9.2* 12.5*  --   HGB 6.0* 6.4* 6.5*  HCT 18.0* 19.3* 19.5*  MCV 90.0 92.3 92.9  PLT 473* 569* 571*   Cardiac Enzymes: No results for input(s): CKTOTAL, CKMB, CKMBINDEX, TROPONINI in the last 168 hours. BNP (last 3 results)  Recent Labs  08/29/14 0810 09/10/14 0612 11/30/14 0910  BNP 1117.0* 549.5* 639.7*    ProBNP (last 3 results) No results for input(s): PROBNP in the last 8760 hours.  CBG: No results for input(s): GLUCAP in the last 168 hours.  Recent Results (from the past 240 hour(s))  MRSA PCR Screening     Status: None   Collection Time: 03/30/15 11:00 AM  Result Value Ref Range Status   MRSA by PCR NEGATIVE NEGATIVE Final    Comment:        The GeneXpert MRSA Assay (FDA approved for NASAL specimens only), is one component of a comprehensive MRSA colonization surveillance program. It is not intended to diagnose MRSA infection nor to guide or monitor treatment for MRSA infections.      Studies: Dg Chest 2 View  03/19/2015  CLINICAL DATA:  Sickle cell crisis EXAM: CHEST  2 VIEW COMPARISON:  03/04/2015 FINDINGS: There is mild bilateral interstitial thickening. There is no focal parenchymal opacity. There is no pleural effusion or pneumothorax. There is stable cardiomegaly. There is a left-sided Port-A-Cath in unchanged position. The osseous structures are unremarkable. IMPRESSION: No acute cardiopulmonary disease. Cardiomegaly with mild pulmonary vascular congestion. Electronically Signed   By: Kathreen Devoid   On: 03/19/2015 09:44   Dg Chest 2 View  03/04/2015  CLINICAL DATA:  Sickle cell pain, crisis, left-sided chest pain EXAM: CHEST  2 VIEW COMPARISON:  02/25/2015 FINDINGS: There is no focal consolidation. There is mild bilateral interstitial thickening. There is no pleural effusion or  pneumothorax. There is stable cardiomegaly. There is a left-sided Port-A-Cath in satisfactory position. The osseous structures are unremarkable. IMPRESSION: Cardiomegaly with mild pulmonary vascular congestion. Electronically Signed   By: Kathreen Devoid   On: 03/04/2015 09:46    Scheduled Meds: . sodium chloride   Intravenous Once  . sodium chloride   Intravenous Once  . aspirin  81 mg Oral Daily  . cholecalciferol  2,000 Units Oral Daily  . enoxaparin  120 mg Subcutaneous Q24H  . folic acid  1 mg Oral q morning - 10a  . HYDROmorphone   Intravenous 6 times per day  . hydroxyurea  500 mg Oral Daily  . ketorolac  30 mg Intravenous 4 times per day  . lisinopril  10 mg Oral Daily  . metoprolol succinate  50 mg Oral Daily  .  morphine  30 mg Oral Q12H  . senna-docusate  1 tablet Oral BID   Continuous Infusions: . dextrose 5 % and 0.45% NaCl 75 mL/hr at 03/30/15 2058    Time spent 25 minutes

## 2015-04-01 DIAGNOSIS — D72829 Elevated white blood cell count, unspecified: Secondary | ICD-10-CM

## 2015-04-01 LAB — TYPE AND SCREEN
ABO/RH(D): O POS
ANTIBODY SCREEN: NEGATIVE
UNIT DIVISION: 0

## 2015-04-01 LAB — CBC WITH DIFFERENTIAL/PLATELET
Basophils Absolute: 0.1 10*3/uL (ref 0.0–0.1)
Basophils Relative: 0 %
Eosinophils Absolute: 0.6 10*3/uL (ref 0.0–0.7)
Eosinophils Relative: 3 %
HCT: 20.7 % — ABNORMAL LOW (ref 39.0–52.0)
HEMOGLOBIN: 7 g/dL — AB (ref 13.0–17.0)
LYMPHS ABS: 3.2 10*3/uL (ref 0.7–4.0)
LYMPHS PCT: 16 %
MCH: 31 pg (ref 26.0–34.0)
MCHC: 33.8 g/dL (ref 30.0–36.0)
MCV: 91.6 fL (ref 78.0–100.0)
Monocytes Absolute: 3 10*3/uL — ABNORMAL HIGH (ref 0.1–1.0)
Monocytes Relative: 15 %
NEUTROS PCT: 65 %
Neutro Abs: 12.8 10*3/uL — ABNORMAL HIGH (ref 1.7–7.7)
Platelets: 520 10*3/uL — ABNORMAL HIGH (ref 150–400)
RBC: 2.26 MIL/uL — AB (ref 4.22–5.81)
RDW: 20.6 % — ABNORMAL HIGH (ref 11.5–15.5)
WBC: 19.7 10*3/uL — AB (ref 4.0–10.5)

## 2015-04-01 LAB — RETICULOCYTES
RBC.: 2.26 MIL/uL — ABNORMAL LOW (ref 4.22–5.81)
RETIC COUNT ABSOLUTE: 370.6 10*3/uL — AB (ref 19.0–186.0)
Retic Ct Pct: 16.4 % — ABNORMAL HIGH (ref 0.4–3.1)

## 2015-04-01 LAB — LACTATE DEHYDROGENASE: LDH: 408 U/L — ABNORMAL HIGH (ref 98–192)

## 2015-04-01 IMAGING — CR DG CHEST 2V
2 series · 2 of 2 positions shown · non-contrast
Comparison: Chest radiograph performed 04/06/2012, and CTA of the
chest performed 03/01/2012

CLINICAL DATA: Sickle cell pain crisis.

CHEST - 2 VIEW

[w chest pa]
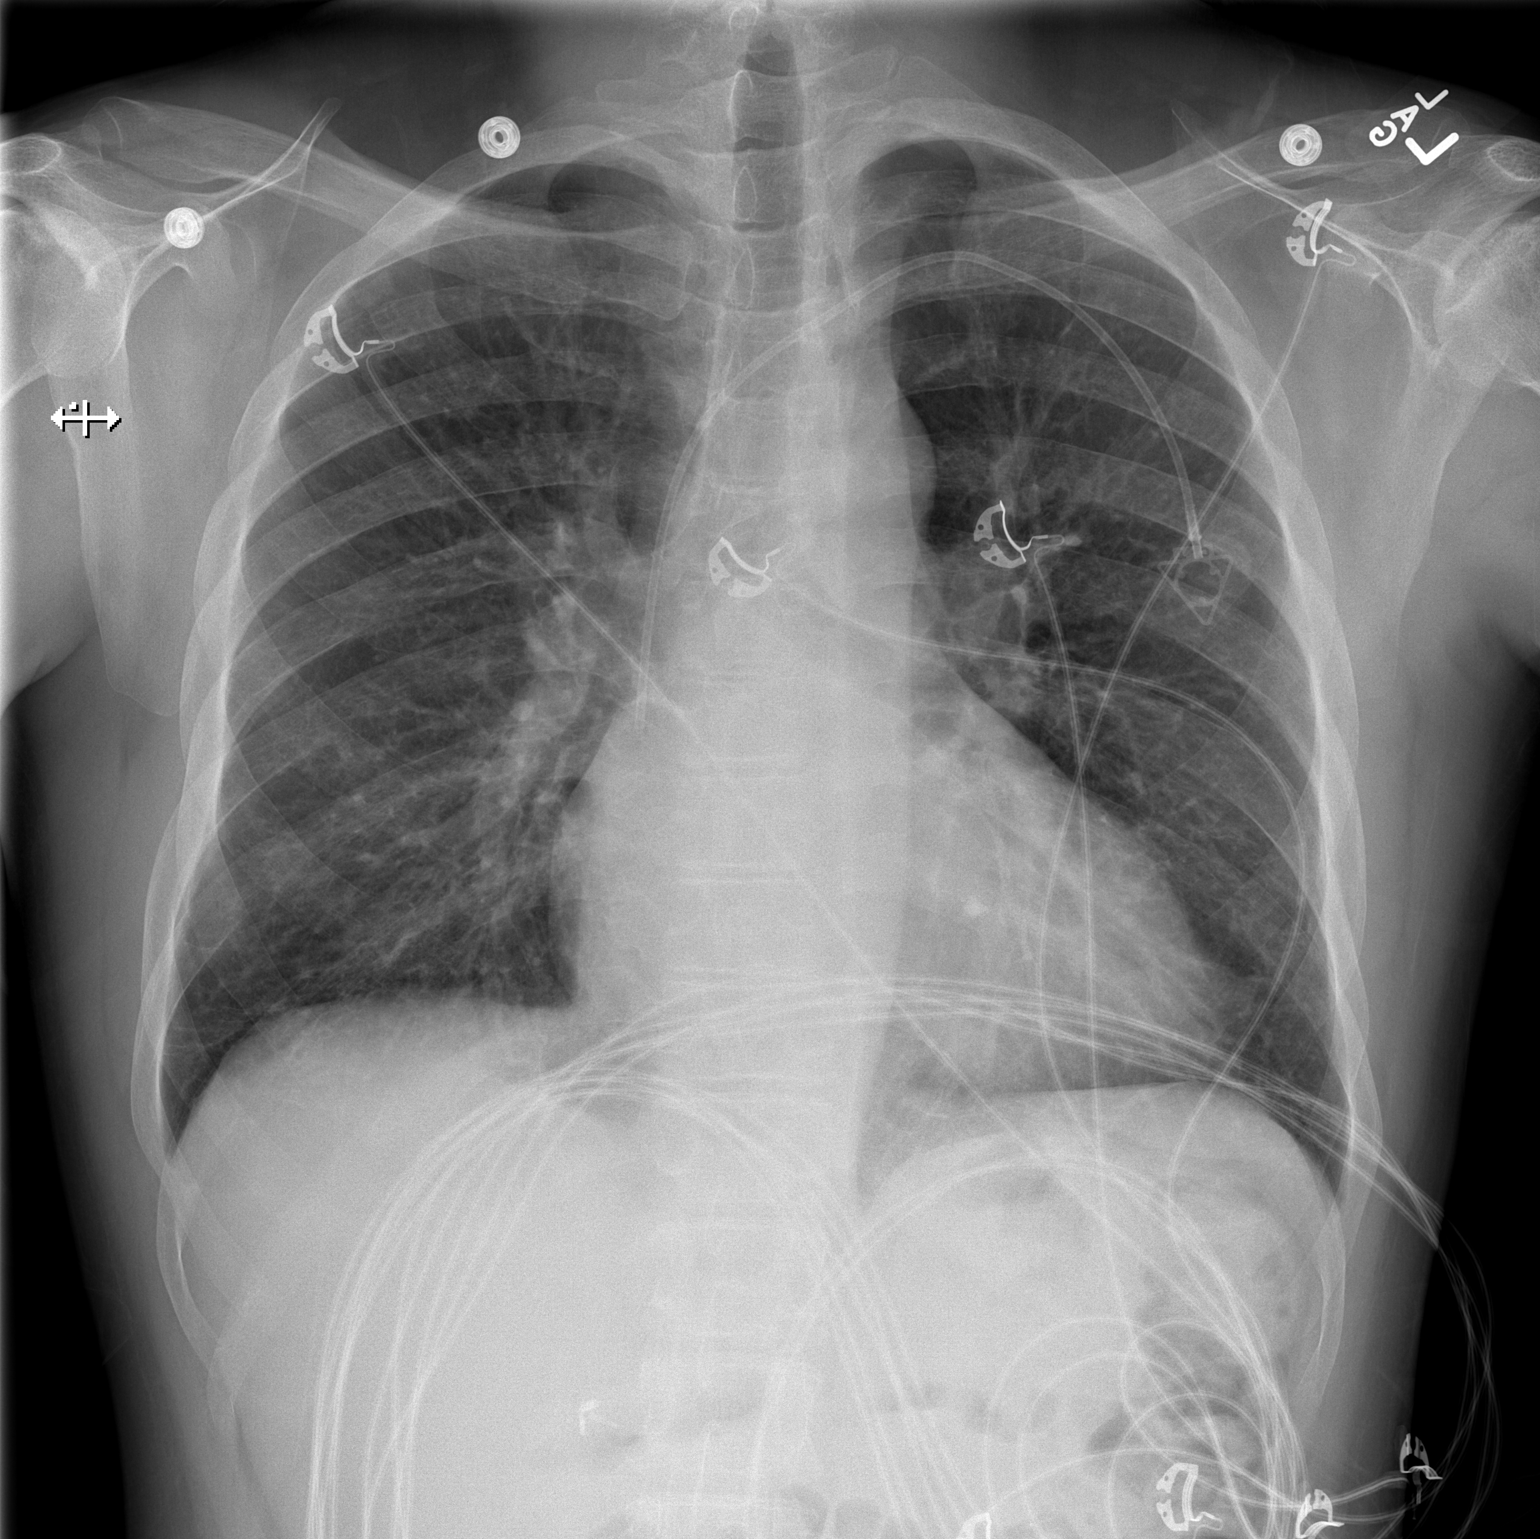

[w chest lat]
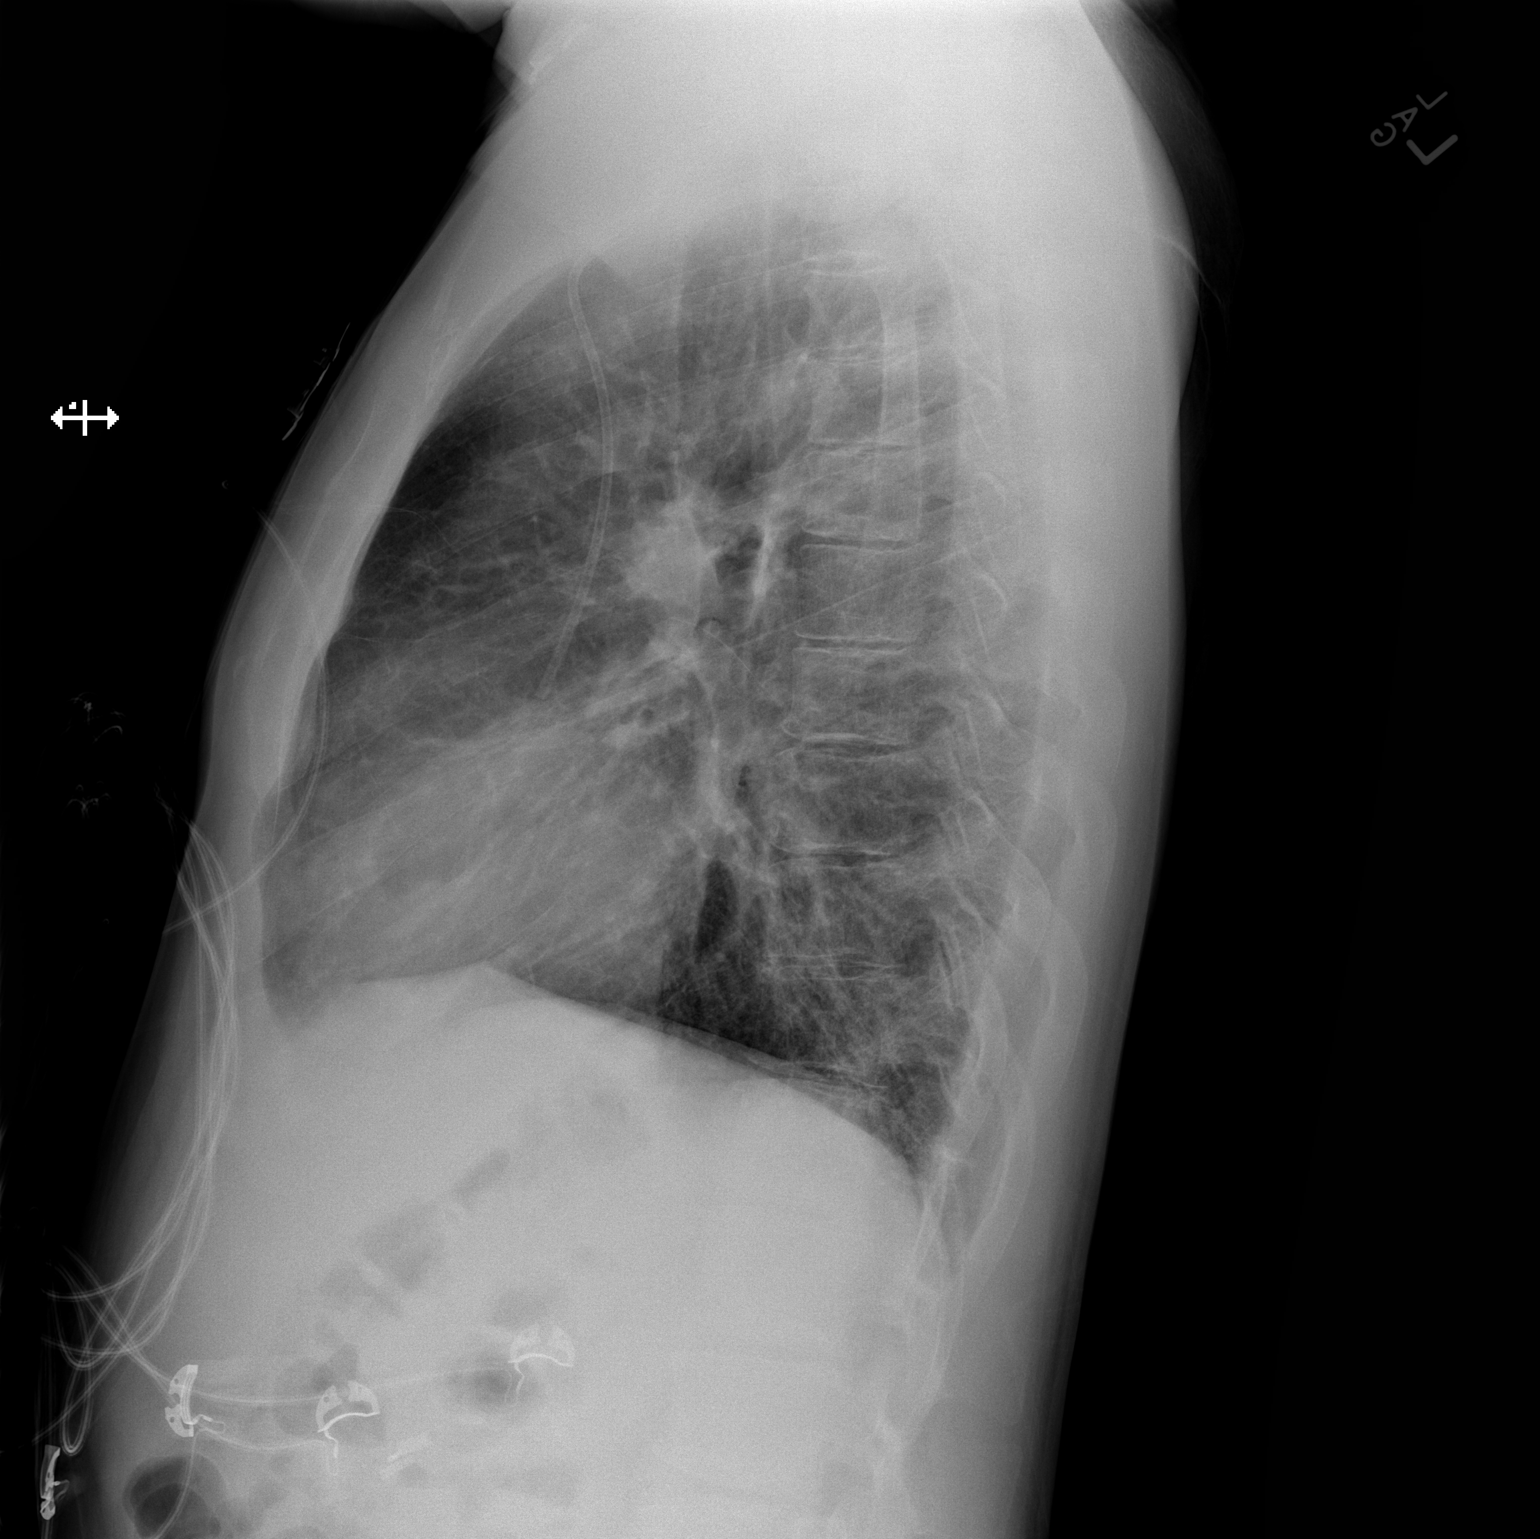

[2 of 2 positions shown; findings below may reference images not displayed]

FINDINGS: The lungs are well-aerated.  Mild chronic lung changes
are again seen, with mildly increased interstitial markings.  No
definite pleural effusion or pneumothorax is identified.

A left-sided chest port is noted ending about the distal SVC.

The heart is borderline normal in size; the mediastinal contour is
within normal limits.  No acute osseous abnormalities are seen.
Clips are noted within the right upper quadrant, reflecting prior
cholecystectomy.
IMPRESSION: Mild chronic lung changes again seen; lungs otherwise clear.

## 2015-04-01 NOTE — Progress Notes (Signed)
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 Hb SS with crisis: Pt reports pain at 7/10 today. I will continue PCA at adjusted dose ,Toradol. and clinician assisted doses. Re-evaluFor possible weaning tomorrow .  2. Acute on chronic respiratory failure with hypoxia: Saturations have improved since transfusion yesterday. Currently the patient is saturating 92% on 2 L which is his baseline. 3. Leukocytosis: ABC count still elevated at close to 20. He still has no evidence of infection. I feel that this is secondary to bone marrow turnover in response to worsening crisis.  4. 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 Will transfuse 1 unit of RBC's today. 5. Chronic pain: Continue MS Contin 6. Chronic Anticoagulation: Pt on Lovenox for recurrent  PE's.   Code Status: Full Code Family Communication: N/A Disposition Plan: Not yet ready for discharge  [place].  Pager [phone]. If 7PM-7AM, please contact night-coverage.  04/01/2015, 10:31 AM  LOS: 2 days   Interim History: Pt reports pain has improved since yesterday is now radiating pain at 7/10 The pain is mostly localized to his ribs and is sharp and throbbing in nature. He denies any SOB or pleuritic pain but states that his ribs hurt more when using incentive spirometer.    Consultants:  None  Procedures:  None  Antibiotics:  None    Objective: Filed Vitals:   04/01/15 0122 04/01/15 0334 04/01/15 0529 04/01/15 1019  BP: 117/84  114/86 121/74  Pulse: 84  82 81  Temp: 98.6 F (37 C)  98.4 F (36.9 C) 99.1 F (37.3 C)  TempSrc: Oral  Oral Oral   Resp: 15 15 15 17   Height:      Weight:   172 lb 1.6 oz (78.064 kg)   SpO2: 94% 95% 95% 92%   Weight change: 2 lb 1.6 oz (0.953 kg)  Intake/Output Summary (Last 24 hours) at 04/01/15 1031 Last data filed at 04/01/15 1020  Gross per 24 hour  Intake 4136.5 ml  Output    550 ml  Net 3586.5 ml    General: Alert, awake, oriented x3, in mild distress due to pain.  HEENT: [place]/AT PEERL, EOMI, increased icterus today. Neck: Trachea midline,  no masses, no thyromegal,y no JVD, no carotid bruit OROPHARYNX:  Moist, No exudate/ erythema/lesions.  Heart: Regular rate and rhythm, without murmurs, rubs, gallops, PMI non-displaced, no heaves or thrills on palpation.  Lungs: Clear to auscultation, no wheezing or rhonchi noted. No increased vocal fremitus resonant to percussion  Abdomen: Soft, nontender, nondistended, positive bowel sounds, no masses no hepatosplenomegaly noted..  Neuro: No focal neurological deficits noted cranial nerves II through XII grossly intact.  Strength at functional baseline in bilateral upper and lower extremities. Musculoskeletal: No warm swelling or erythema around joints, no spinal tenderness noted. Psychiatric: Patient alert and oriented x3, good insight and cognition, good recent to remote recall.    Data Reviewed: Basic Metabolic Panel:  Recent Labs Lab 03/30/15 0400 03/30/15 1109 03/31/15 0525  NA 140  --  138  K 3.9  --  4.3  CL 109  --  108  CO2 23  --  22  GLUCOSE  95  --  137*  BUN 10  --  15  CREATININE 0.73  --  0.89  CALCIUM 9.0  --  8.7*  MG  --  1.9  --    Liver Function Tests:  Recent Labs Lab 03/30/15 0400  AST 33  ALT 31  ALKPHOS 87  BILITOT 2.7*  PROT 7.5  ALBUMIN 4.2   No results for input(s): LIPASE, AMYLASE in the last 168 hours. No results for input(s): AMMONIA in the last 168 hours. CBC:  Recent Labs Lab 03/30/15 0400 03/31/15 0525 04/01/15 0515  WBC 17.3* 22.0* 19.7*  NEUTROABS 12.5* 17.2* 12.8*  HGB 6.4* 6.5*  7.0*  HCT 19.3* 19.5* 20.7*  MCV 92.3 92.9 91.6  PLT 569* 571* 520*   Cardiac Enzymes: No results for input(s): CKTOTAL, CKMB, CKMBINDEX, TROPONINI in the last 168 hours. BNP (last 3 results)  Recent Labs  08/29/14 0810 09/10/14 0612 11/30/14 0910  BNP 1117.0* 549.5* 639.7*    ProBNP (last 3 results) No results for input(s): PROBNP in the last 8760 hours.  CBG: No results for input(s): GLUCAP in the last 168 hours.  Recent Results (from the past 240 hour(s))  MRSA PCR Screening     Status: None   Collection Time: 03/30/15 11:00 AM  Result Value Ref Range Status   MRSA by PCR NEGATIVE NEGATIVE Final    Comment:        The GeneXpert MRSA Assay (FDA approved for NASAL specimens only), is one component of a comprehensive MRSA colonization surveillance program. It is not intended to diagnose MRSA infection nor to guide or monitor treatment for MRSA infections.      Studies: Dg Chest 2 View  03/19/2015  CLINICAL DATA:  Sickle cell crisis EXAM: CHEST  2 VIEW COMPARISON:  03/04/2015 FINDINGS: There is mild bilateral interstitial thickening. There is no focal parenchymal opacity. There is no pleural effusion or pneumothorax. There is stable cardiomegaly. There is a left-sided Port-A-Cath in unchanged position. The osseous structures are unremarkable. IMPRESSION: No acute cardiopulmonary disease. Cardiomegaly with mild pulmonary vascular congestion. Electronically Signed   By: Kathreen Devoid   On: 03/19/2015 09:44   Dg Chest 2 View  03/04/2015  CLINICAL DATA:  Sickle cell pain, crisis, left-sided chest pain EXAM: CHEST  2 VIEW COMPARISON:  02/25/2015 FINDINGS: There is no focal consolidation. There is mild bilateral interstitial thickening. There is no pleural effusion or pneumothorax. There is stable cardiomegaly. There is a left-sided Port-A-Cath in satisfactory position. The osseous structures are unremarkable. IMPRESSION: Cardiomegaly with mild pulmonary vascular congestion.  Electronically Signed   By: Kathreen Devoid   On: 03/04/2015 09:46    Scheduled Meds: . sodium chloride   Intravenous Once  . aspirin  81 mg Oral Daily  . cholecalciferol  2,000 Units Oral Daily  . enoxaparin  120 mg Subcutaneous Q24H  . folic acid  1 mg Oral q morning - 10a  . HYDROmorphone   Intravenous 6 times per day  . hydroxyurea  500 mg Oral Daily  . ketorolac  30 mg Intravenous 4 times per day  . lisinopril  10 mg Oral Daily  . metoprolol succinate  50 mg Oral Daily  . morphine  30 mg Oral Q12H  . senna-docusate  1 tablet Oral BID   Continuous Infusions: . dextrose 5 % and 0.45% NaCl 75 mL/hr at 03/31/15 2356    Time spent 25 minutes

## 2015-04-02 DIAGNOSIS — I10 Essential (primary) hypertension: Secondary | ICD-10-CM

## 2015-04-02 MED ORDER — SODIUM CHLORIDE 0.9% FLUSH: 10.0000 mL | INTRAVENOUS | Status: DC | PRN

## 2015-04-02 MED ORDER — SODIUM CHLORIDE 0.9% FLUSH
10.0000 mL | Freq: Two times a day (BID) | INTRAVENOUS | Status: DC
  Administered 2015-04-06: 10 mL

## 2015-04-02 NOTE — Progress Notes (Signed)
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 Hb SS with crisis: Pt reports pain at 7/10 today. I will continue PCA at adjusted dose ,Toradol. and clinician assisted doses. Re-evaluate for possible weaning tomorrow .  2. Acute on chronic respiratory failure with hypoxia: Saturations have improved since transfusion yesterday. Currently the patient is saturating 92% on 2 L which is his baseline. 3. Leukocytosis: WBC count still elevated at close to 20. He still has no evidence of infection. I feel that this is secondary to bone marrow turnover in response to worsening crisis.  4. 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 Status post transfusion 1 unit RBCs 5. Chronic pain: Continue MS Contin 6. Chronic Anticoagulation: Pt on Lovenox for recurrent  PE's.   Code Status: Full Code Family Communication: N/A Disposition Plan: Not yet ready for discharge  [place].  Pager [phone]. If 7PM-7AM, please contact night-coverage.  04/02/2015, 11:20 AM  LOS: 3 days   Interim History: Pt reports pain about the same as yesterday at 7/10 The pain is mostly localized to his ribs and is sharp and throbbing in nature. He denies any SOB or pleuritic pain but states that his ribs hurt more when using incentive spirometer.    Consultants:  None  Procedures:  None  Antibiotics:  None    Objective: Filed Vitals:   04/02/15 0247 04/02/15 0336 04/02/15 0536 04/02/15 0749  BP: 120/77  130/89   Pulse: 88  84   Temp: 99.1 F (37.3 C)  98.9 F (37.2 C)   TempSrc: Oral  Oral   Resp: 17 18 25 14   Height:      Weight: 173 lb  1.6 oz (78.518 kg)     SpO2: 94% 92% 95% 92%   Weight change: 1 lb (0.454 kg)  Intake/Output Summary (Last 24 hours) at 04/02/15 1120 Last data filed at 04/02/15 0727  Gross per 24 hour  Intake   1835 ml  Output   1175 ml  Net    660 ml    General: Alert, awake, oriented x3, in mild distress due to pain.  HEENT: [place]/AT PEERL, EOMI, increased icterus today. Neck: Trachea midline,  no masses, no thyromegal,y no JVD, no carotid bruit OROPHARYNX:  Moist, No exudate/ erythema/lesions.  Heart: Regular rate and rhythm, without murmurs, rubs, gallops, PMI non-displaced, no heaves or thrills on palpation.  Lungs: Clear to auscultation, no wheezing or rhonchi noted. No increased vocal fremitus resonant to percussion  Abdomen: Soft, nontender, nondistended, positive bowel sounds, no masses no hepatosplenomegaly noted..  Neuro: No focal neurological deficits noted cranial nerves II through XII grossly intact.  Strength at functional baseline in bilateral upper and lower extremities. Musculoskeletal: No warm swelling or erythema around joints, no spinal tenderness noted. Psychiatric: Patient alert and oriented x3, good insight and cognition, good recent to remote recall.    Data Reviewed: Basic Metabolic Panel:  Recent Labs Lab 03/30/15 0400 03/30/15 1109 03/31/15 0525  NA 140  --  138  K 3.9  --  4.3  CL 109  --  108  CO2 23  --  22  GLUCOSE 95  --  137*  BUN 10  --  15  CREATININE 0.73  --  0.89  CALCIUM 9.0  --  8.7*  MG  --  1.9  --    Liver Function Tests:  Recent Labs Lab 03/30/15 0400  AST 33  ALT 31  ALKPHOS 87  BILITOT 2.7*  PROT 7.5  ALBUMIN 4.2   No results for input(s): LIPASE, AMYLASE in the last 168 hours. No results for input(s): AMMONIA in the last 168 hours. CBC:  Recent Labs Lab 03/30/15 0400 03/31/15 0525 04/01/15 0515  WBC 17.3* 22.0* 19.7*  NEUTROABS 12.5* 17.2* 12.8*  HGB 6.4* 6.5* 7.0*  HCT 19.3* 19.5* 20.7*  MCV 92.3 92.9 91.6  PLT 569*  571* 520*   Cardiac Enzymes: No results for input(s): CKTOTAL, CKMB, CKMBINDEX, TROPONINI in the last 168 hours. BNP (last 3 results)  Recent Labs  08/29/14 0810 09/10/14 0612 11/30/14 0910  BNP 1117.0* 549.5* 639.7*    ProBNP (last 3 results) No results for input(s): PROBNP in the last 8760 hours.  CBG: No results for input(s): GLUCAP in the last 168 hours.  Recent Results (from the past 240 hour(s))  MRSA PCR Screening     Status: None   Collection Time: 03/30/15 11:00 AM  Result Value Ref Range Status   MRSA by PCR NEGATIVE NEGATIVE Final    Comment:        The GeneXpert MRSA Assay (FDA approved for NASAL specimens only), is one component of a comprehensive MRSA colonization surveillance program. It is not intended to diagnose MRSA infection nor to guide or monitor treatment for MRSA infections.      Studies: Dg Chest 2 View  03/19/2015  CLINICAL DATA:  Sickle cell crisis EXAM: CHEST  2 VIEW COMPARISON:  03/04/2015 FINDINGS: There is mild bilateral interstitial thickening. There is no focal parenchymal opacity. There is no pleural effusion or pneumothorax. There is stable cardiomegaly. There is a left-sided Port-A-Cath in unchanged position. The osseous structures are unremarkable. IMPRESSION: No acute cardiopulmonary disease. Cardiomegaly with mild pulmonary vascular congestion. Electronically Signed   By: Kathreen Devoid   On: 03/19/2015 09:44   Dg Chest 2 View  03/04/2015  CLINICAL DATA:  Sickle cell pain, crisis, left-sided chest pain EXAM: CHEST  2 VIEW COMPARISON:  02/25/2015 FINDINGS: There is no focal consolidation. There is mild bilateral interstitial thickening. There is no pleural effusion or pneumothorax. There is stable cardiomegaly. There is a left-sided Port-A-Cath in satisfactory position. The osseous structures are unremarkable. IMPRESSION: Cardiomegaly with mild pulmonary vascular congestion. Electronically Signed   By: Kathreen Devoid   On: 03/04/2015 09:46     Scheduled Meds: . sodium chloride   Intravenous Once  . aspirin  81 mg Oral Daily  . cholecalciferol  2,000 Units Oral Daily  . enoxaparin  120 mg Subcutaneous Q24H  . folic acid  1 mg Oral q morning - 10a  . HYDROmorphone   Intravenous 6 times per day  . hydroxyurea  500 mg Oral Daily  . ketorolac  30 mg Intravenous 4 times per day  . lisinopril  10 mg Oral Daily  . metoprolol succinate  50 mg Oral Daily  . morphine  30 mg Oral Q12H  . senna-docusate  1 tablet Oral BID   Continuous Infusions: . dextrose 5 % and 0.45% NaCl 75 mL/hr at 04/02/15 0252    Time spent 25 minutes

## 2015-04-02 NOTE — Care Management Important Message (Signed)
Important Message  Patient Details  Name: ELIO GILLENWATER MRN: [id number] Date of Birth: 1980/02/20   Medicare Important Message Given:  Yes    [name] 04/02/2015, 1:05 [place] Message  Patient Details  Name: HUSSAIN NATALE MRN: [id number] Date of Birth: [birth year]   Medicare Important Message Given:  Yes    [name] 04/02/2015, 1:04 PM

## 2015-04-03 DIAGNOSIS — J9611 Chronic respiratory failure with hypoxia: Secondary | ICD-10-CM

## 2015-04-03 DIAGNOSIS — F05 Delirium due to known physiological condition: Secondary | ICD-10-CM

## 2015-04-03 LAB — CBC WITH DIFFERENTIAL/PLATELET
BASOS ABS: 0 10*3/uL (ref 0.0–0.1)
Basophils Relative: 0 %
EOS ABS: 0.5 10*3/uL (ref 0.0–0.7)
Eosinophils Relative: 3 %
HCT: 20 % — ABNORMAL LOW (ref 39.0–52.0)
Hemoglobin: 6.7 g/dL — CL (ref 13.0–17.0)
LYMPHS PCT: 15 %
Lymphs Abs: 2.5 10*3/uL (ref 0.7–4.0)
MCH: 30.5 pg (ref 26.0–34.0)
MCHC: 33.5 g/dL (ref 30.0–36.0)
MCV: 90.9 fL (ref 78.0–100.0)
MONO ABS: 2.3 10*3/uL — AB (ref 0.1–1.0)
Monocytes Relative: 14 %
NEUTROS PCT: 68 %
Neutro Abs: 11.4 10*3/uL — ABNORMAL HIGH (ref 1.7–7.7)
PLATELETS: 474 10*3/uL — AB (ref 150–400)
RBC: 2.2 MIL/uL — AB (ref 4.22–5.81)
RDW: 20.6 % — ABNORMAL HIGH (ref 11.5–15.5)
WBC: 16.7 10*3/uL — AB (ref 4.0–10.5)
nRBC: 2 /100 WBC — ABNORMAL HIGH

## 2015-04-03 LAB — BASIC METABOLIC PANEL
ANION GAP: 7 (ref 5–15)
BUN: 14 mg/dL (ref 6–20)
CALCIUM: 8.9 mg/dL (ref 8.9–10.3)
CO2: 21 mmol/L — ABNORMAL LOW (ref 22–32)
Chloride: 114 mmol/L — ABNORMAL HIGH (ref 101–111)
Creatinine, Ser: 0.81 mg/dL (ref 0.61–1.24)
GFR calc Af Amer: >60 mL/min (ref >60)
Glucose, Bld: 107 mg/dL — ABNORMAL HIGH (ref 65–99)
POTASSIUM: 4.2 mmol/L (ref 3.5–5.1)
SODIUM: 142 mmol/L (ref 135–145)

## 2015-04-03 LAB — LACTATE DEHYDROGENASE: LDH: 394 U/L — AB (ref 98–192)

## 2015-04-03 LAB — RETICULOCYTES
RBC.: 2.2 MIL/uL — AB (ref 4.22–5.81)
RETIC COUNT ABSOLUTE: 435.6 10*3/uL — AB (ref 19.0–186.0)
Retic Ct Pct: 19.8 % — ABNORMAL HIGH (ref 0.4–3.1)

## 2015-04-03 MED ORDER — HYDROMORPHONE HCL 4 MG PO TABS
4.0000 mg | ORAL_TABLET | ORAL | Status: DC
  Administered 2015-04-03 – 2015-04-06 (×18): 4 mg via ORAL
  Filled 2015-04-03 (×19): qty 1

## 2015-04-03 NOTE — Progress Notes (Signed)
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 Acute Confusion: Resolved. Pt was off his Oxygen at the time and unsure if this contributed to his confusion. Pt completely lucid at this time and reports that he is unsure why he disconnected from his IV but he does remember the incident.  2. Hb SS with crisis: Pt reports pain at 7/10 today has not received PRN medication in 4 hours despite reports that he requested pain breakthrough pain medication. IV Dilaudid appropriately not dispensed a that time due to state of confusion earlier. Pt currently fully alert and lucid.  I will continue PCA at current dose and Toradol. Discontinue clinician assisted doses and schedule oral Dilaudid. Re-evaluate for possible discharge tomorrow .  3. Acute on chronic respiratory failure with hypoxia: Saturations currently at baseline. Will check ambulatory pulse-ox today. 4. Leukocytosis: WBC count minimally improved. He still has no evidence of infection. I feel that this is secondary to bone marrow turnover in response to worsening crisis.  5. 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 Status post transfusion 1 unit RBCs 6. Chronic pain: Continue MS Contin 7. Chronic Anticoagulation: Pt on Lovenox for recurrent  PE's.    Code Status: Full Code Family Communication: N/A Disposition Plan: Anticipate discharge in 1-2 days  Sissi Padia A.  Pager [phone]. If 7PM-7AM, please contact night-coverage.  04/03/2015, 11:51 AM  LOS: 4 days   Interim History: Pt reports pain about the same as yesterday at 7/10 The pain is mostly localized to his ribs and  is sharp and throbbing in nature. He denies any SOB or pleuritic pain but states that his ribs hurt more when using incentive spirometer.  Nursing reports that this morning patient was found walking in the hallway disconnected from his IV pump. He was disoriented to his location and had to be re-oriented to his room.  Currently patient fully lucid and oriented x 3.    Consultants:  None  Procedures:  None  Antibiotics:  None    Objective: Filed Vitals:   04/03/15 0536 04/03/15 0816 04/03/15 0945 04/03/15 1030  BP: 123/85  116/78 122/83  Pulse: 78  67 69  Temp: 97.8 F (36.6 C)  98.4 F (36.9 C) 98 F (36.7 C)  TempSrc: Oral  Oral Oral  Resp: 14 16 16 16   Height:      Weight: 172 lb 14.4 oz (78.427 kg)     SpO2: 95% 95% 94% 95%   Weight change: -3.2 oz (-0.091 kg)  Intake/Output Summary (Last 24 hours) at 04/03/15 1151 Last data filed at 04/03/15 1123  Gross per 24 hour  Intake    955 ml  Output    725 ml  Net    230 ml    General: Alert, awake, oriented x3, in mild distress due to pain.  HEENT: [place]/AT PEERL, EOMI, increased icterus today. Neck: Trachea midline,  no masses, no thyromegal,y no JVD, no carotid bruit OROPHARYNX:  Moist, No exudate/ erythema/lesions.  Heart: Regular rate and rhythm, without murmurs, rubs, gallops, PMI non-displaced, no heaves or thrills on palpation.  Lungs: Clear to auscultation, no wheezing or rhonchi noted. No increased vocal  fremitus resonant to percussion  Abdomen: Soft, nontender, nondistended, positive bowel sounds, no masses no hepatosplenomegaly noted..  Neuro: No focal neurological deficits noted cranial nerves II through XII grossly intact.  Strength at functional baseline in bilateral upper and lower extremities. Musculoskeletal: No warm swelling or erythema around joints, no spinal tenderness noted. Psychiatric: Patient alert and oriented x3, good insight and cognition, good recent to remote recall.    Data  Reviewed: Basic Metabolic Panel:  Recent Labs Lab 03/30/15 0400 03/30/15 1109 03/31/15 0525 04/03/15 0500  NA 140  --  138 142  K 3.9  --  4.3 4.2  CL 109  --  108 114*  CO2 23  --  22 21*  GLUCOSE 95  --  137* 107*  BUN 10  --  15 14  CREATININE 0.73  --  0.89 0.81  CALCIUM 9.0  --  8.7* 8.9  MG  --  1.9  --   --    Liver Function Tests:  Recent Labs Lab 03/30/15 0400  AST 33  ALT 31  ALKPHOS 87  BILITOT 2.7*  PROT 7.5  ALBUMIN 4.2   No results for input(s): LIPASE, AMYLASE in the last 168 hours. No results for input(s): AMMONIA in the last 168 hours. CBC:  Recent Labs Lab 03/30/15 0400 03/31/15 0525 04/01/15 0515 04/03/15 0500  WBC 17.3* 22.0* 19.7* 16.7*  NEUTROABS 12.5* 17.2* 12.8* 11.4*  HGB 6.4* 6.5* 7.0* 6.7*  HCT 19.3* 19.5* 20.7* 20.0*  MCV 92.3 92.9 91.6 90.9  PLT 569* 571* 520* 474*   Cardiac Enzymes: No results for input(s): CKTOTAL, CKMB, CKMBINDEX, TROPONINI in the last 168 hours. BNP (last 3 results)  Recent Labs  08/29/14 0810 09/10/14 0612 11/30/14 0910  BNP 1117.0* 549.5* 639.7*    ProBNP (last 3 results) No results for input(s): PROBNP in the last 8760 hours.  CBG: No results for input(s): GLUCAP in the last 168 hours.  Recent Results (from the past 240 hour(s))  MRSA PCR Screening     Status: None   Collection Time: 03/30/15 11:00 AM  Result Value Ref Range Status   MRSA by PCR NEGATIVE NEGATIVE Final    Comment:        The GeneXpert MRSA Assay (FDA approved for NASAL specimens only), is one component of a comprehensive MRSA colonization surveillance program. It is not intended to diagnose MRSA infection nor to guide or monitor treatment for MRSA infections.      Studies: Dg Chest 2 View  03/19/2015  CLINICAL DATA:  Sickle cell crisis EXAM: CHEST  2 VIEW COMPARISON:  03/04/2015 FINDINGS: There is mild bilateral interstitial thickening. There is no focal parenchymal opacity. There is no pleural effusion or  pneumothorax. There is stable cardiomegaly. There is a left-sided Port-A-Cath in unchanged position. The osseous structures are unremarkable. IMPRESSION: No acute cardiopulmonary disease. Cardiomegaly with mild pulmonary vascular congestion. Electronically Signed   By: Kathreen Devoid   On: 03/19/2015 09:44    Scheduled Meds: . sodium chloride   Intravenous Once  . aspirin  81 mg Oral Daily  . cholecalciferol  2,000 Units Oral Daily  . enoxaparin  120 mg Subcutaneous Q24H  . folic acid  1 mg Oral q morning - 10a  . HYDROmorphone   Intravenous 6 times per day  . HYDROmorphone  4 mg Oral Q4H  . hydroxyurea  500 mg Oral Daily  . ketorolac  30 mg Intravenous 4 times per day  . lisinopril  10 mg Oral  Daily  . metoprolol succinate  50 mg Oral Daily  . morphine  30 mg Oral Q12H  . senna-docusate  1 tablet Oral BID  . sodium chloride flush  10-40 mL Intracatheter Q12H   Continuous Infusions: . dextrose 5 % and 0.45% NaCl 10 mL/hr at 04/02/15 2340    Time spent 25 minutes

## 2015-04-03 NOTE — Progress Notes (Signed)
Critical lab value hgb= 6.7, on call provider notified, no new orders at this time. Will continue to monitor.

## 2015-04-03 NOTE — Progress Notes (Signed)
Pt found walking independently in hall, was alert, denied needs when asked. Pt returned to room.

## 2015-04-04 MED ORDER — HYDROMORPHONE HCL 2 MG/ML IJ SOLN
2.0000 mg | Freq: Once | INTRAMUSCULAR | Status: AC
  Administered 2015-04-04: 2 mg via INTRAVENOUS
  Filled 2015-04-04: qty 1

## 2015-04-04 NOTE — Progress Notes (Signed)
Patient ID: [name], male   DOB: Jun 27, 1980, 35 y.o.   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 Hb SS with Crisis: Pt reports pain at 10/10 today, patient is requesting Dilaudid for breakthrough pain, he said he has not had any pain medication on when necessary basis since yesterday morning. Pt currently fully alert and lucid. I will continue PCA at current dose and Toradol. Give 1 time dose of IV Dilaudid 2 mg, NO PRN/Clinician assisted doses. Re-evaluate for possible discharge tomorrow .  2. Acute on chronic respiratory failure with hypoxia: Saturations currently at baseline. Ambulatory pulse-ox today acceptable 3. Leukocytosis: WBC count minimally improved. He still has no evidence of infection. I feel that this is secondary to bone marrow turnover in response to worsening crisis.  4. 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 Status post transfusion 1 unit RBCs. We will check hemoglobin in a.m. 5. Chronic pain: Continue MS Contin 6. Chronic Anticoagulation: Pt on Lovenox for recurrent PE's.  Code Status: Full Code Family Communication: N/A Disposition Plan: Not yet ready for discharge  [name]  If 7PM-7AM, please contact night-coverage.  04/04/2015, 11:25 AM  LOS: 5 days   Interim History: Patient reports pain has increased from 7-10, mostly localized to his hips and joints. He has no fever. He denies shortness of breath. He has no pleuritic chest pain. Ambulatory pulse oximeter acceptable at 94%. Currently oriented to time place and  person.  Consultants:  None  Procedures:  None  Antibiotics:  None  Objective: Filed Vitals:   04/04/15 0210 04/04/15 0513 04/04/15 1000 04/04/15 1001  BP: 125/93 126/72 127/82   Pulse: 80 74 72   Temp: 98.3 F (36.8 C) 98.5 F (36.9 C) 98.5 F (36.9 C)   TempSrc: Oral Oral Oral   Resp: 18 18 18 16   Height:      Weight:  160 lb 8 oz (72.802 kg)    SpO2: 92% 90% 95% 94%   Weight change: -12 lb 6.4 oz (-5.625 kg)  Intake/Output Summary (Last 24 hours) at 04/04/15 1125 Last data filed at 04/04/15 0513  Gross per 24 hour  Intake    600 ml  Output   1075 ml  Net   -475 ml    General: Alert, awake, oriented x3, in no acute distress.  HEENT: [place]/AT [place], EOMI Neck: Trachea midline,  no masses, no thyromegal,y no JVD, no carotid bruit OROPHARYNX:  Moist, No exudate/ erythema/lesions.  Heart: Regular rate and rhythm, + murmurs, rubs, gallops, PMI non-displaced, no heaves or thrills on palpation.  Lungs: Clear to auscultation, no wheezing or rhonchi noted. No increased vocal fremitus resonant to percussion  Abdomen: Soft, nontender, nondistended, positive bowel sounds, no masses no hepatosplenomegaly noted..  Neuro: No focal neurological deficits noted cranial nerves II through XII grossly intact. DTRs 2+ bilaterally upper and lower extremities. Strength 5 out of 5 in bilateral upper and lower extremities. Musculoskeletal: No warm swelling or erythema around joints, no spinal tenderness noted. Psychiatric: Patient alert and oriented x3, good insight and cognition, good recent to remote recall.  Data Reviewed: Basic Metabolic Panel:  Recent  Labs Lab 03/30/15 0400 03/30/15 1109 03/31/15 0525 04/03/15 0500  NA 140  --  138 142  K 3.9  --  4.3 4.2  CL 109  --  108 114*  CO2 23  --  22 21*  GLUCOSE 95  --  137* 107*  BUN 10  --  15 14  CREATININE 0.73  --  0.89 0.81  CALCIUM 9.0  --  8.7* 8.9  MG  --  1.9  --   --    Liver Function Tests:  Recent Labs Lab  03/30/15 0400  AST 33  ALT 31  ALKPHOS 87  BILITOT 2.7*  PROT 7.5  ALBUMIN 4.2   No results for input(s): LIPASE, AMYLASE in the last 168 hours. No results for input(s): AMMONIA in the last 168 hours. CBC:  Recent Labs Lab 03/30/15 0400 03/31/15 0525 04/01/15 0515 04/03/15 0500  WBC 17.3* 22.0* 19.7* 16.7*  NEUTROABS 12.5* 17.2* 12.8* 11.4*  HGB 6.4* 6.5* 7.0* 6.7*  HCT 19.3* 19.5* 20.7* 20.0*  MCV 92.3 92.9 91.6 90.9  PLT 569* 571* 520* 474*   Cardiac Enzymes: No results for input(s): CKTOTAL, CKMB, CKMBINDEX, TROPONINI in the last 168 hours. BNP (last 3 results)  Recent Labs  08/29/14 0810 09/10/14 0612 11/30/14 0910  BNP 1117.0* 549.5* 639.7*    ProBNP (last 3 results) No results for input(s): PROBNP in the last 8760 hours.  CBG: No results for input(s): GLUCAP in the last 168 hours.  Recent Results (from the past 240 hour(s))  MRSA PCR Screening     Status: None   Collection Time: 03/30/15 11:00 AM  Result Value Ref Range Status   MRSA by PCR NEGATIVE NEGATIVE Final    Comment:        The GeneXpert MRSA Assay (FDA approved for NASAL specimens only), is one component of a comprehensive MRSA colonization surveillance program. It is not intended to diagnose MRSA infection nor to guide or monitor treatment for MRSA infections.      Studies: Dg Chest 2 View  03/19/2015  CLINICAL DATA:  Sickle cell crisis EXAM: CHEST  2 VIEW COMPARISON:  03/04/2015 FINDINGS: There is mild bilateral interstitial thickening. There is no focal parenchymal opacity. There is no pleural effusion or pneumothorax. There is stable cardiomegaly. There is a left-sided Port-A-Cath in unchanged position. The osseous structures are unremarkable. IMPRESSION: No acute cardiopulmonary disease. Cardiomegaly with mild pulmonary vascular congestion. Electronically Signed   By: Kathreen Devoid   On: 03/19/2015 09:44    Scheduled Meds: . sodium chloride   Intravenous Once  . aspirin  81 mg  Oral Daily  . cholecalciferol  2,000 Units Oral Daily  . enoxaparin  120 mg Subcutaneous Q24H  . folic acid  1 mg Oral q morning - 10a  . HYDROmorphone   Intravenous 6 times per day  . HYDROmorphone  4 mg Oral Q4H  . hydroxyurea  500 mg Oral Daily  . ketorolac  30 mg Intravenous 4 times per day  . lisinopril  10 mg Oral Daily  . metoprolol succinate  50 mg Oral Daily  . morphine  30 mg Oral Q12H  . senna-docusate  1 tablet Oral BID  . sodium chloride flush  10-40 mL Intracatheter Q12H   Continuous Infusions: . 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

## 2015-04-05 DIAGNOSIS — Z86711 Personal history of pulmonary embolism: Secondary | ICD-10-CM

## 2015-04-05 LAB — CBC WITH DIFFERENTIAL/PLATELET
BASOS ABS: 0.2 10*3/uL — AB (ref 0.0–0.1)
Basophils Relative: 1 %
EOS ABS: 0.5 10*3/uL (ref 0.0–0.7)
Eosinophils Relative: 3 %
HEMATOCRIT: 19.7 % — AB (ref 39.0–52.0)
Hemoglobin: 6.7 g/dL — CL (ref 13.0–17.0)
LYMPHS ABS: 3.2 10*3/uL (ref 0.7–4.0)
Lymphocytes Relative: 19 %
MCH: 30.5 pg (ref 26.0–34.0)
MCHC: 34 g/dL (ref 30.0–36.0)
MCV: 89.5 fL (ref 78.0–100.0)
MONO ABS: 2.7 10*3/uL — AB (ref 0.1–1.0)
Monocytes Relative: 16 %
NEUTROS ABS: 10.2 10*3/uL — AB (ref 1.7–7.7)
Neutrophils Relative %: 61 %
PLATELETS: 451 10*3/uL — AB (ref 150–400)
RBC: 2.2 MIL/uL — ABNORMAL LOW (ref 4.22–5.81)
RDW: 20 % — AB (ref 11.5–15.5)
WBC: 16.8 10*3/uL — ABNORMAL HIGH (ref 4.0–10.5)

## 2015-04-05 MED ORDER — KETOROLAC TROMETHAMINE 30 MG/ML IJ SOLN
30.0000 mg | Freq: Once | INTRAMUSCULAR | Status: AC
  Administered 2015-04-05: 30 mg via INTRAVENOUS
  Filled 2015-04-05: qty 1

## 2015-04-05 MED ORDER — HYDROMORPHONE HCL 1 MG/ML IJ SOLN
0.5000 mg | Freq: Once | INTRAMUSCULAR | Status: AC
  Administered 2015-04-05: 0.5 mg via INTRAVENOUS
  Filled 2015-04-05: qty 1

## 2015-04-05 NOTE — Progress Notes (Signed)
Patient ID: [name], male   DOB: 04-14-1980, 35 y.o.   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 Hb SS with Crisis: Pt reports pain level is the same. Pt currently fully alert and lucid. Will continue PCA at current dose and Toradol. NO PRN/Clinician assisted doses. Re-evaluate for possible discharge tomorrow. Patient has been counseled extensively about ambulation. Continue home pain medication. 2. Acute on chronic respiratory failure with hypoxia: Saturations currently at baseline. Ambulatory pulse-ox today acceptable. Continue current management 3. Leukocytosis: WBC count minimally improved. He still has no evidence of infection. This is most likely secondary to bone marrow turnover in response to worsening crisis.  4. Acute Hemolytic Anemia in setting of Anemia of chronic disease: Pt had symptoms of decreased Hb. Status post transfusion 1 unit RBCs. Hemoglobin is holding around 6.7 last 2 days, may need another unit if below 6.5. We will check hemoglobin in a.m. 5. Chronic pain: Continue MS Contin 6. Chronic Anticoagulation: Pt on Lovenox for recurrent PE's.   Code Status: Full Code Family Communication: Discussed at length with patient Disposition Plan: Anticipate discharge tomorrow 04/06/2015  Cristi Gwynn, [place]  If 7PM-7AM, please contact night-coverage.  04/05/2015, 3:06 PM  LOS: 6 days   Interim History: Patient reports ongoing pain at the same level as yesterday, has a flat affect showing no evidence of pain distress but rate his pain at 10 out of 10. He denies any shortness of breath. No fever. Vital signs are within normal  limits.  Consultants:  None  Procedures:  None  Antibiotics:  None  Objective: Filed Vitals:   04/05/15 0610 04/05/15 1004 04/05/15 1005 04/05/15 1235  BP: 118/78  131/85   Pulse: 78  69   Temp: 98.3 F (36.8 C)  97.9 F (36.6 C)   TempSrc: Oral  Oral   Resp: 16 16 14 12   Height:      Weight:      SpO2: 90% 93% 98% 94%   Weight change:   Intake/Output Summary (Last 24 hours) at 04/05/15 1506 Last data filed at 04/05/15 1400  Gross per 24 hour  Intake    600 ml  Output   2475 ml  Net  -1875 ml    General: Alert, awake, oriented x3, in no acute distress.  HEENT: [place]/AT [place], EOMI Neck: Trachea midline,  no masses, no thyromegal,y no JVD, no carotid bruit OROPHARYNX:  Moist, No exudate/ erythema/lesions.  Heart: Regular rate and rhythm, + murmurs, rubs, gallops, PMI non-displaced, no heaves or thrills on palpation.  Lungs: Clear to auscultation, no wheezing or rhonchi noted. No increased vocal fremitus resonant to percussion  Abdomen: Soft, nontender, nondistended, positive bowel sounds, no masses no hepatosplenomegaly noted..  Neuro: No focal neurological deficits noted cranial nerves II through XII grossly intact. DTRs 2+ bilaterally upper and lower extremities. Strength 5 out of 5 in bilateral upper and lower extremities. Musculoskeletal: No warm swelling or erythema around joints, no spinal tenderness noted. Psychiatric: Patient alert and oriented x3, good insight and cognition, good recent to remote recall. Lymph node survey: No cervical axillary or inguinal lymphadenopathy noted.   Data Reviewed: Basic Metabolic Panel:  Recent Labs Lab 03/30/15 0400  03/30/15 1109 03/31/15 0525 04/03/15 0500  NA 140  --  138 142  K 3.9  --  4.3 4.2  CL 109  --  108 114*  CO2 23  --  22 21*  GLUCOSE 95  --  137* 107*  BUN 10  --  15 14  CREATININE 0.73  --  0.89 0.81  CALCIUM 9.0  --  8.7* 8.9  MG  --  1.9  --   --    Liver Function Tests:  Recent Labs Lab  03/30/15 0400  AST 33  ALT 31  ALKPHOS 87  BILITOT 2.7*  PROT 7.5  ALBUMIN 4.2   No results for input(s): LIPASE, AMYLASE in the last 168 hours. No results for input(s): AMMONIA in the last 168 hours. CBC:  Recent Labs Lab 03/30/15 0400 03/31/15 0525 04/01/15 0515 04/03/15 0500 04/05/15 0552  WBC 17.3* 22.0* 19.7* 16.7* 16.8*  NEUTROABS 12.5* 17.2* 12.8* 11.4* 10.2*  HGB 6.4* 6.5* 7.0* 6.7* 6.7*  HCT 19.3* 19.5* 20.7* 20.0* 19.7*  MCV 92.3 92.9 91.6 90.9 89.5  PLT 569* 571* 520* 474* 451*   Cardiac Enzymes: No results for input(s): CKTOTAL, CKMB, CKMBINDEX, TROPONINI in the last 168 hours. BNP (last 3 results)  Recent Labs  08/29/14 0810 09/10/14 0612 11/30/14 0910  BNP 1117.0* 549.5* 639.7*    ProBNP (last 3 results) No results for input(s): PROBNP in the last 8760 hours.  CBG: No results for input(s): GLUCAP in the last 168 hours.  Recent Results (from the past 240 hour(s))  MRSA PCR Screening     Status: None   Collection Time: 03/30/15 11:00 AM  Result Value Ref Range Status   MRSA by PCR NEGATIVE NEGATIVE Final    Comment:        The GeneXpert MRSA Assay (FDA approved for NASAL specimens only), is one component of a comprehensive MRSA colonization surveillance program. It is not intended to diagnose MRSA infection nor to guide or monitor treatment for MRSA infections.      Studies: Dg Chest 2 View  03/19/2015  CLINICAL DATA:  Sickle cell crisis EXAM: CHEST  2 VIEW COMPARISON:  03/04/2015 FINDINGS: There is mild bilateral interstitial thickening. There is no focal parenchymal opacity. There is no pleural effusion or pneumothorax. There is stable cardiomegaly. There is a left-sided Port-A-Cath in unchanged position. The osseous structures are unremarkable. IMPRESSION: No acute cardiopulmonary disease. Cardiomegaly with mild pulmonary vascular congestion. Electronically Signed   By: Kathreen Devoid   On: 03/19/2015 09:44    Scheduled Meds: . sodium  chloride   Intravenous Once  . aspirin  81 mg Oral Daily  . cholecalciferol  2,000 Units Oral Daily  . enoxaparin  120 mg Subcutaneous Q24H  . folic acid  1 mg Oral q morning - 10a  . HYDROmorphone   Intravenous 6 times per day  . HYDROmorphone  4 mg Oral Q4H  . hydroxyurea  500 mg Oral Daily  . lisinopril  10 mg Oral Daily  . metoprolol succinate  50 mg Oral Daily  . morphine  30 mg Oral Q12H  . senna-docusate  1 tablet Oral BID  . sodium chloride flush  10-40 mL Intracatheter Q12H   Continuous Infusions: . 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

## 2015-04-06 ENCOUNTER — Telehealth: Payer: Self-pay | Admitting: *Deleted

## 2015-04-06 DIAGNOSIS — D571 Sickle-cell disease without crisis: Secondary | ICD-10-CM

## 2015-04-06 DIAGNOSIS — G894 Chronic pain syndrome: Secondary | ICD-10-CM

## 2015-04-06 LAB — CBC WITH DIFFERENTIAL/PLATELET
Basophils Absolute: 0 10*3/uL (ref 0.0–0.1)
Basophils Relative: 0 %
EOS ABS: 0.5 10*3/uL (ref 0.0–0.7)
Eosinophils Relative: 3 %
HCT: 21 % — ABNORMAL LOW (ref 39.0–52.0)
Hemoglobin: 7.1 g/dL — ABNORMAL LOW (ref 13.0–17.0)
Lymphocytes Relative: 21 %
Lymphs Abs: 3.5 10*3/uL (ref 0.7–4.0)
MCH: 30.7 pg (ref 26.0–34.0)
MCHC: 33.8 g/dL (ref 30.0–36.0)
MCV: 90.9 fL (ref 78.0–100.0)
MONO ABS: 3 10*3/uL — AB (ref 0.1–1.0)
Monocytes Relative: 18 %
NEUTROS ABS: 9.7 10*3/uL — AB (ref 1.7–7.7)
NRBC: 2 /100{WBCs} — AB
Neutrophils Relative %: 58 %
PLATELETS: 429 10*3/uL — AB (ref 150–400)
RBC: 2.31 MIL/uL — AB (ref 4.22–5.81)
RDW: 20.5 % — AB (ref 11.5–15.5)
WBC: 16.7 10*3/uL — AB (ref 4.0–10.5)

## 2015-04-06 LAB — RETICULOCYTES
RBC.: 2.32 MIL/uL — ABNORMAL LOW (ref 4.22–5.81)
RETIC CT PCT: 15.2 % — AB (ref 0.4–3.1)
Retic Count, Absolute: 352.6 10*3/uL — ABNORMAL HIGH (ref 19.0–186.0)

## 2015-04-06 MED ORDER — HYDROMORPHONE HCL 4 MG PO TABS: 4.0000 mg | ORAL_TABLET | ORAL | Status: DC | PRN

## 2015-04-06 MED ORDER — HYDROMORPHONE HCL 2 MG/ML IJ SOLN
2.0000 mg | INTRAMUSCULAR | Status: DC | PRN
  Administered 2015-04-06 – 2015-04-08 (×16): 2 mg via INTRAVENOUS
  Filled 2015-04-06 (×16): qty 1

## 2015-04-06 MED ORDER — MORPHINE SULFATE ER 30 MG PO TBCR: 30.0000 mg | EXTENDED_RELEASE_TABLET | Freq: Two times a day (BID) | ORAL | Status: DC

## 2015-04-06 NOTE — Telephone Encounter (Signed)
Reviewed [facility] Substance Reporting system prior to prescribing opiate medications, no inconsistencies noted.  Meds ordered this encounter  Medications  . HYDROmorphone (DILAUDID) 4 MG tablet    Sig: Take 1 tablet (4 mg total) by mouth every 4 (four) hours as needed for severe pain.    Dispense:  90 tablet    Refill:  0  . morphine (MS CONTIN) 30 MG 12 hr tablet    Sig: Take 1 tablet (30 mg total) by mouth every 12 (twelve) hours.    Dispense:  60 tablet    Refill:  0    Rx not to be filled prior to 04/09/2015    Order Specific Question:  Supervising Provider    Answer:  Tresa Garter [id number]    Dorena Dew, FNP

## 2015-04-06 NOTE — Telephone Encounter (Signed)
Pt called and needs a refill of his Dilaudid and Morphine MS CONTIN. Please advise provider. Thanks

## 2015-04-06 NOTE — Progress Notes (Signed)
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 Hb SS with crisis: Pt appears to have gone back into crisis overnight. He is writhing in pain and reports pain at 8/10 today and localized to his ribs. Will add clinician assisted doses on a PRN basis. Continue PCA and discontinue oral analgesics. Add ibuprofen on a PRN basis. Will speak with his PMD about resuming Neurontin but  Will start 300 mg HS today. 2. Acute on chronic respiratory failure with hypoxia: Saturations currently at baseline. Will check ambulatory pulse-ox today. 3. Leukocytosis: WBC count improved. He still has no evidence of infection. I feel that this is secondary to bone marrow turnover in response to worsening crisis.  4. 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 Status post transfusion 1 unit RBCs. Hb remains stable.  5. Chronic pain: Continue MS Contin 6. Chronic Anticoagulation: Pt on Lovenox for recurrent  PE's.    Code Status: Full Code Family Communication: N/A Disposition Plan: Anticipate discharge in 1-2 days  [name],[name]  Pager [phone]. If 7PM-7AM, please contact night-coverage.  04/06/2015, 12:57 PM  LOS: 7 days   Interim History: Pt writhing in pain and reports pain 8/10 localized to ribs. Last BM yesterday.  Consultants:  None  Procedures:  None  Antibiotics:  None    Objective: Filed Vitals:   04/06/15 0625 04/06/15 0753 04/06/15 0954 04/06/15 1028  BP: 120/81  123/82 118/84  Pulse: 67  70 58  Temp: 98.7 F (37.1 C)     TempSrc: Oral   Tympanic  Resp: 18 16  16   Height:      Weight:      SpO2: 93% 95%  94%    Weight change:   Intake/Output Summary (Last 24 hours) at 04/06/15 1257 Last data filed at 04/06/15 0724  Gross per 24 hour  Intake    120 ml  Output   1325 ml  Net  -1205 ml    General: Alert, awake, oriented x3, in moderate distress due to pain.  HEENT: [place]/AT [place], EOMI, increased icterus today. Neck: Trachea midline,  no masses, no thyromegal,y no JVD, no carotid bruit OROPHARYNX:  Moist, No exudate/ erythema/lesions.  Heart: Regular rate and rhythm, without murmurs, rubs, gallops, PMI non-displaced, no heaves or thrills on palpation.  Lungs: Clear to auscultation, no wheezing or rhonchi noted. No increased vocal fremitus resonant to percussion  Abdomen: Soft, nontender, nondistended, positive bowel sounds, no masses no hepatosplenomegaly noted..  Neuro: No focal neurological deficits noted cranial nerves II through XII grossly intact.  Strength at functional baseline in bilateral upper and lower extremities. Musculoskeletal: No warm swelling or erythema around joints, no spinal tenderness noted. Psychiatric: Patient alert and oriented x3, good insight and cognition, good recent to remote recall.    Data Reviewed: Basic Metabolic Panel:  Recent Labs Lab 03/31/15 0525 04/03/15 0500  NA 138 142  K 4.3 4.2  CL 108 114*  CO2 22 21*  GLUCOSE 137* 107*  BUN 15 14  CREATININE 0.89 0.81  CALCIUM 8.7* 8.9   Liver Function Tests: No results for input(s): AST, ALT, ALKPHOS, BILITOT, PROT, ALBUMIN in the last 168 hours.  No results for input(s): LIPASE, AMYLASE in the last 168 hours. No results for input(s): AMMONIA in the last 168 hours. CBC:  Recent Labs Lab 03/31/15 0525 04/01/15 0515 04/03/15 0500 04/05/15 0552 04/06/15 0554  WBC 22.0* 19.7* 16.7* 16.8* 16.7*  NEUTROABS 17.2* 12.8* 11.4* 10.2* 9.7*  HGB 6.5* 7.0* 6.7* 6.7* 7.1*  HCT 19.5* 20.7* 20.0* 19.7* 21.0*  MCV 92.9 91.6 90.9 89.5 90.9  PLT 571* 520* 474* 451* 429*   Cardiac Enzymes: No results for  input(s): CKTOTAL, CKMB, CKMBINDEX, TROPONINI in the last 168 hours. BNP (last 3 results)  Recent Labs  08/29/14 0810 09/10/14 0612 11/30/14 0910  BNP 1117.0* 549.5* 639.7*    ProBNP (last 3 results) No results for input(s): PROBNP in the last 8760 hours.  CBG: No results for input(s): GLUCAP in the last 168 hours.  Recent Results (from the past 240 hour(s))  MRSA PCR Screening     Status: None   Collection Time: 03/30/15 11:00 AM  Result Value Ref Range Status   MRSA by PCR NEGATIVE NEGATIVE Final    Comment:        The GeneXpert MRSA Assay (FDA approved for NASAL specimens only), is one component of a comprehensive MRSA colonization surveillance program. It is not intended to diagnose MRSA infection nor to guide or monitor treatment for MRSA infections.      Studies: Dg Chest 2 View  03/19/2015  CLINICAL DATA:  Sickle cell crisis EXAM: CHEST  2 VIEW COMPARISON:  03/04/2015 FINDINGS: There is mild bilateral interstitial thickening. There is no focal parenchymal opacity. There is no pleural effusion or pneumothorax. There is stable cardiomegaly. There is a left-sided Port-A-Cath in unchanged position. The osseous structures are unremarkable. IMPRESSION: No acute cardiopulmonary disease. Cardiomegaly with mild pulmonary vascular congestion. Electronically Signed   By: Kathreen Devoid   On: 03/19/2015 09:44    Scheduled Meds: . sodium chloride   Intravenous Once  . aspirin  81 mg Oral Daily  . cholecalciferol  2,000 Units Oral Daily  . enoxaparin  120 mg Subcutaneous Q24H  . folic acid  1 mg Oral q morning - 10a  . HYDROmorphone   Intravenous 6 times per day  . hydroxyurea  500 mg Oral Daily  . lisinopril  10 mg Oral Daily  . metoprolol succinate  50 mg Oral Daily  . morphine  30 mg Oral Q12H  . senna-docusate  1 tablet Oral BID  . sodium chloride flush  10-40 mL Intracatheter Q12H   Continuous Infusions: . dextrose 5 % and 0.45% NaCl 10 mL/hr at 04/05/15 0039     Time spent 25 minutes

## 2015-04-06 NOTE — Telephone Encounter (Signed)
Refill request for dilaudid and MS Contin. LOV 03/24/2015. Please advise. Thanks!

## 2015-04-06 NOTE — Care Management Important Message (Signed)
Important Message  Patient Details  Name: JAZIEL POLHEMUS MRN: [id number] Date of Birth: 07-Jan-1981   Medicare Important Message Given:  Yes    [name] 04/06/2015, 3:11 PMImportant Message  Patient Details  Name: KHAEL LINO MRN: [id number] Date of Birth: 12-Mar-1980   Medicare Important Message Given:  Yes    [name] 04/06/2015, 3:11 PM

## 2015-04-07 MED ORDER — HYDROMORPHONE HCL 4 MG PO TABS
4.0000 mg | ORAL_TABLET | ORAL | Status: DC
  Administered 2015-04-07 – 2015-04-08 (×7): 4 mg via ORAL
  Filled 2015-04-07 (×8): qty 1

## 2015-04-07 NOTE — Progress Notes (Signed)
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 Hb SS with crisis: Pt improved today and rates pain at 6-7/10 localized to ribs.. Will Schedule oral Dilaudid and continue clinician assisted doses on a PRN basis.Discontinue PCA and continue ibuprofen on a PRN basis. Continue Neurontin at 300 mg HS. Anticipate discharge home tomorrow. 2. Acute on chronic respiratory failure with hypoxia: Acute component resolved. Saturations currently at baseline. Will check ambulatory pulse-ox today. 3. Leukocytosis: WBC count improved. He still has no evidence of infection. I feel that this is secondary to bone marrow turnover in response to worsening crisis.  4. Acute Hemolytic Anemia in setting of Anemia of chronic disease: Pt had symptoms of decreased Hb. Status post transfusion 1 unit RBCs. Hb remains stable.  5. Chronic pain: Continue MS Contin 6. Chronic Anticoagulation: Pt on Lovenox for recurrent  PE's.    Code Status: Full Code Family Communication: N/A Disposition Plan: Anticipate discharge tomorrow   Binnie Vonderhaar A.  Pager [phone]. If 7PM-7AM, please contact night-coverage.  04/07/2015, 11:58 AM  LOS: 8 days   Interim History: Pt reports pain improved since yesterday. Pain now at 6-7/10 compared to baseline of 5/10  Last BM yesterday.  Consultants:  None  Procedures:  None  Antibiotics:  None    Objective: Filed Vitals:   04/07/15 0459 04/07/15 0541 04/07/15 0817 04/07/15 1054  BP: 117/76   122/82  Pulse: 72   66  Temp: 98.2 F (36.8 C)   98.6 F (37 C)  TempSrc: Oral   Oral  Resp: 14 15 14 13   Height:      Weight: 156 lb 3.2 oz (70.852 kg)     SpO2: 96%  96% 93% 94%   Weight change: -1.6 oz (-0.045 kg)  Intake/Output Summary (Last 24 hours) at 04/07/15 1158 Last data filed at 04/07/15 1055  Gross per 24 hour  Intake   1140 ml  Output   2675 ml  Net  -1535 ml    General: Alert, awake, oriented x3, in no apparent distress.  HEENT: [place]/AT PEERL, EOMI, no icterus present today. Neck: Trachea midline,  no masses, no thyromegal,y no JVD, no carotid bruit OROPHARYNX:  Moist, No exudate/ erythema/lesions.  Heart: Regular rate and rhythm, without murmurs, rubs, gallops, PMI non-displaced, no heaves or thrills on palpation.  Lungs: Clear to auscultation, no wheezing or rhonchi noted. No increased vocal fremitus resonant to percussion  Abdomen: Soft, nontender, nondistended, positive bowel sounds, no masses no hepatosplenomegaly noted.  Neuro: No focal neurological deficits noted cranial nerves II through XII grossly intact.  Strength at functional baseline in bilateral upper and lower extremities. Musculoskeletal: No warmth swelling or erythema around joints, no spinal tenderness noted. Psychiatric: Patient alert and oriented x3, good insight and cognition, good recent to remote recall.    Data Reviewed: Basic Metabolic Panel:  Recent Labs Lab 04/03/15 0500  NA 142  K 4.2  CL 114*  CO2 21*  GLUCOSE 107*  BUN 14  CREATININE 0.81  CALCIUM 8.9   Liver Function Tests: No results for input(s): AST, ALT, ALKPHOS, BILITOT, PROT, ALBUMIN in the last 168 hours. No results for input(s): LIPASE, AMYLASE in the last 168 hours. No results for  input(s): AMMONIA in the last 168 hours. CBC:  Recent Labs Lab 04/01/15 0515 04/03/15 0500 04/05/15 0552 04/06/15 0554  WBC 19.7* 16.7* 16.8* 16.7*  NEUTROABS 12.8* 11.4* 10.2* 9.7*  HGB 7.0* 6.7* 6.7* 7.1*  HCT 20.7* 20.0* 19.7* 21.0*  MCV 91.6 90.9 89.5 90.9  PLT 520* 474* 451* 429*   Cardiac Enzymes: No results for input(s): CKTOTAL, CKMB, CKMBINDEX, TROPONINI in the last 168 hours. BNP  (last 3 results)  Recent Labs  08/29/14 0810 09/10/14 0612 11/30/14 0910  BNP 1117.0* 549.5* 639.7*    ProBNP (last 3 results) No results for input(s): PROBNP in the last 8760 hours.  CBG: No results for input(s): GLUCAP in the last 168 hours.  Recent Results (from the past 240 hour(s))  MRSA PCR Screening     Status: None   Collection Time: 03/30/15 11:00 AM  Result Value Ref Range Status   MRSA by PCR NEGATIVE NEGATIVE Final    Comment:        The GeneXpert MRSA Assay (FDA approved for NASAL specimens only), is one component of a comprehensive MRSA colonization surveillance program. It is not intended to diagnose MRSA infection nor to guide or monitor treatment for MRSA infections.      Studies: Dg Chest 2 View  03/19/2015  CLINICAL DATA:  Sickle cell crisis EXAM: CHEST  2 VIEW COMPARISON:  03/04/2015 FINDINGS: There is mild bilateral interstitial thickening. There is no focal parenchymal opacity. There is no pleural effusion or pneumothorax. There is stable cardiomegaly. There is a left-sided Port-A-Cath in unchanged position. The osseous structures are unremarkable. IMPRESSION: No acute cardiopulmonary disease. Cardiomegaly with mild pulmonary vascular congestion. Electronically Signed   By: Kathreen Devoid   On: 03/19/2015 09:44    Scheduled Meds: . sodium chloride   Intravenous Once  . aspirin  81 mg Oral Daily  . cholecalciferol  2,000 Units Oral Daily  . enoxaparin  120 mg Subcutaneous Q24H  . folic acid  1 mg Oral q morning - 10a  . HYDROmorphone  4 mg Oral Q4H  . hydroxyurea  500 mg Oral Daily  . lisinopril  10 mg Oral Daily  . metoprolol succinate  50 mg Oral Daily  . morphine  30 mg Oral Q12H  . senna-docusate  1 tablet Oral BID  . sodium chloride flush  10-40 mL Intracatheter Q12H   Continuous Infusions: . dextrose 5 % and 0.45% NaCl 10 mL/hr at 04/07/15 0207    Time spent 25 minutes

## 2015-04-08 DIAGNOSIS — I272 Other secondary pulmonary hypertension: Secondary | ICD-10-CM

## 2015-04-08 MED ORDER — HEPARIN SOD (PORK) LOCK FLUSH 100 UNIT/ML IV SOLN
500.0000 [IU] | INTRAVENOUS | Status: AC | PRN
  Administered 2015-04-08: 500 [IU]
  Filled 2015-04-08: qty 5

## 2015-04-08 MED ORDER — GABAPENTIN 300 MG PO CAPS: 300.0000 mg | ORAL_CAPSULE | Freq: Three times a day (TID) | ORAL | Status: DC

## 2015-04-08 NOTE — Progress Notes (Signed)
Nursing Discharge Summary  Patient ID: KIERON PARTIN MRN: [id number] DOB/AGE: 1980-07-15 35 y.o.  Admit date: 03/30/2015 Discharge date: 04/08/2015  Discharged Condition: good  Disposition: 01-Home or Self Care    Prescriptions Given: Prescription for Gabapentin called into pharmacy.  Patient verbalized understanding of medications and follow up appointments without further questions.    Means of Discharge: Patient chose to ambulate downstairs to be discharged home.   Signed: Buel Ream 04/08/2015, 6:22 PM

## 2015-04-08 NOTE — Discharge Summary (Signed)
[name] MRN: [id number] DOB/AGE: Mar 16, 1980 35 y.o.  Admit date: 03/30/2015 Discharge date: 04/08/2015  Primary Care Physician:  [name], MD   Discharge Diagnoses:   Patient Active Problem List   Diagnosis Date Noted  . Cor pulmonale, chronic ([place]) 08/25/2014    Priority: High  . Pulmonary HTN ([place]) 06/18/2013    Priority: High  . Functional asplenia     Priority: High  . Leukocytosis 02/25/2015    Priority: Medium  . Anemia of chronic disease 06/25/2014    Priority: Medium  . Chronic respiratory failure with hypoxia ([place]) 03/14/2014    Priority: Medium  . Hb-SS disease with crisis ([place]) 01/22/2014    Priority: Medium  . Chronic anticoagulation 08/22/2013    Priority: Medium  . Avascular necrosis ([place])     Priority: Low  . 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 On home oxygen therapy 02/23/2015  . Anemia 01/31/2015  . Symptomatic anemia   . Syncope 11/30/2014  . Chest pain   . Hb-SS disease without crisis ([place]) 10/07/2014  . Acute respiratory failure with hypoxia ([place])   . Acute chest syndrome in sickle crisis ([place])   . PVC's (premature ventricular contractions) 09/10/2014  . Abnormal EKG 09/10/2014  . Hypoxemia   . Chronic atrial fibrillation ([place]) 09/09/2014  . Sickle cell crisis ([place]) 09/08/2014  . Bacteremia   . Elevated troponin I level 08/29/2014  . Ankle edema 07/07/2014  . Sickle cell anemia ([place]) 06/25/2014  . PAH (pulmonary artery hypertension) ([place]) 03/18/2014  . Paralytic strabismus, external ophthalmoplegia   . Chronic pain syndrome 12/12/2013  . Essential hypertension 08/22/2013  . Vitamin D deficiency 02/13/2013  . Embolism, pulmonary with infarction ([place]) 07/09/2012  . Hx of pulmonary embolus 06/29/2012  . Hemochromatosis 12/14/2011    DISCHARGE MEDICATION:   Medication List    TAKE these medications        aspirin 81 MG chewable tablet  Chew 1 tablet (81 mg total) by mouth daily.     cyclobenzaprine 5 MG tablet   Commonly known as:  FLEXERIL  Take 1 tablet (5 mg total) by mouth 3 (three) times daily as needed for muscle spasms.     enoxaparin 120 MG/0.8ML injection  Commonly known as:  LOVENOX  Inject 0.8 mLs (120 mg total) into the skin daily.     folic acid 1 MG tablet  Commonly known as:  FOLVITE  Take 1 tablet (1 mg total) by mouth every morning.     gabapentin 300 MG capsule  Commonly known as:  NEURONTIN  Take 1 capsule (300 mg total) by mouth 3 (three) times daily.     HYDROmorphone 4 MG tablet  Commonly known as:  DILAUDID  Take 1 tablet (4 mg total) by mouth every 4 (four) hours as needed for severe pain.     hydroxyurea 500 MG capsule  Commonly known as:  HYDREA  Take 1 capsule (500 mg total) by mouth daily. May take with food to minimize GI side effects.     lisinopril 10 MG tablet  Commonly known as:  PRINIVIL,ZESTRIL  Take 1 tablet (10 mg total) by mouth daily.     metoprolol succinate 50 MG 24 hr tablet  Commonly known as:  TOPROL-XL  Take 1 tablet (50 mg total) by mouth daily.     morphine 30 MG 12 hr tablet  Commonly known as:  MS CONTIN  Take 1 tablet (30 mg total) by mouth every 12 (twelve) hours.  potassium chloride SA 20 MEQ tablet  Commonly known as:  K-DUR,KLOR-CON  Take 1 tablet (20 mEq total) by mouth every morning.     Vitamin D 2000 units tablet  Take 1 tablet (2,000 Units total) by mouth daily.     zolpidem 10 MG tablet  Commonly known as:  AMBIEN  Take 1 tablet (10 mg total) by mouth at bedtime as needed for sleep.          SIGNIFICANT DIAGNOSTIC STUDIES:  Dg Chest 2 View  03/19/2015  CLINICAL DATA:  Sickle cell crisis EXAM: CHEST  2 VIEW COMPARISON:  03/04/2015 FINDINGS: There is mild bilateral interstitial thickening. There is no focal parenchymal opacity. There is no pleural effusion or pneumothorax. There is stable cardiomegaly. There is a left-sided Port-A-Cath in unchanged position. The osseous structures are unremarkable. IMPRESSION:  No acute cardiopulmonary disease. Cardiomegaly with mild pulmonary vascular congestion. Electronically Signed   By: Kathreen Devoid   On: 03/19/2015 09:44      Recent Results (from the past 240 hour(s))  MRSA PCR Screening     Status: None   Collection Time: 03/30/15 11:00 AM  Result Value Ref Range Status   MRSA by PCR NEGATIVE NEGATIVE Final    Comment:        The GeneXpert MRSA Assay (FDA approved for NASAL specimens only), is one component of a comprehensive MRSA colonization surveillance program. It is not intended to diagnose MRSA infection nor to guide or monitor treatment for MRSA infections.     BRIEF ADMITTING H & P: [name] is a 35 y.o. male with PMH of sickle cell disease, pulmonary embolism on Lovenox, C. difficile colitis, drug abuse, hypertension, who presents with pain all over.  Pt reports that he has sudden onset, constant pain all over, particularly in his ribs, legs and back this AM. He had mild chest pain, which has resolved. He does not have cough, shortness of breath, fever, chills, abdominal pain, nausea, vomiting, diarrhea, symptoms of UTI, unilateral weakness. He states that he is compliant to Lovenox injection.  In ED, patient was found to have hemoglobin 6.0 on 03/25/15--> 6.4 today, WBC 17.3, temperature normal, no tachycardia, electrolytes and renal function okay, negative chest x-ray for acute abnormalities. Patient is admitted to inpatient for further evaluation treatment   Hospital Course:  Present on Admission:  . Sickle cell pain crisis [facility]): Patient was managed with IV Dilaudid via PCA, Toradol, IV fluids in addition to continuation of his long-acting MS Contin for management of his chronic pain.. As his pain improved he was transitioned to oral medications and discharged home on prehospital regimen of medications. The patient is to follow-up with his primary care provider as scheduled. . Acute hemolytic anemia in setting of anemia of  chronic disease: Has a baseline hemoglobin of 6.5-7. During his hospitalization he had acute hemolysis with his hemoglobin dropping as low as 6. Ordinarily I would not transfuse the patient at this level with good reticulocytosis however the patient was also acutely hypoxic at symptomatic from this level of anemia. He was transfused one unit of blood at the time of discharge his hemoglobin was 6.7 g/dL. He has a good reticulocytosis and I expect that his hemoglobin will improve in the next several days . Acute on chronic respiratory failure with hypoxia: Asian had a decrease in his oxygenation with the concurrent increase in oxygen requirement up to 4 L/m at rest. After transfusion he returned to baseline with the requirement of only  3 L/m of oxygen with ambulation..  . Hx of pulmonary embolus: Patient is continued on Lovenox for chronic anticoagulation.  . Pulmonary HTN ([place]): Patient has a resultant chronic respiratory failure with hypoxemia-see above.  . Essential hypertension: Well controlled on current medications  . Chronic pain syndrome: Continue MS Contin  . Leukocytosis: Patient had an elevated white blood cell count without any evidence of infection. It is felt that this is secondary to high bone marrow turnover associated with his anemia in addition to chronic hypoxemia.    Disposition and Follow-up: Patient is discharged in good condition. At the time of discharge his oxygen requirement is 3 L/m of oxygen with ambulation. He's to follow-up with his primary care physician within one week.     Discharge Instructions    Activity as tolerated - No restrictions    Complete by:  As directed      Diet general    Complete by:  As directed            DISCHARGE EXAM:  General: Alert, awake, oriented x3, in mild distress.  Vital signs: BP104/84, HR 64, T98.1 F (36.7 C), temperature source Oral, RR 18, height 6\' 1"  (1.854 m), weight 156 lb 11.2 oz (71.079 kg), SpO2 99 % on 3 L/m of  oxygen. HEENT: [place]/AT PEERL, EOMI, anicteric Neck: Trachea midline, no masses, no thyromegal,y no JVD, no carotid bruit OROPHARYNX: Moist, No exudate/ erythema/lesions.  Heart: Regular rate and rhythm, without murmurs, rubs, gallops or S3. PMI non-displaced. Exam reveals no decreased pulses. Pulmonary/Chest: Normal effort. Breath sounds normal. No. Apnea. Clear to auscultation,no stridor,  no wheezing and no rhonchi noted. No respiratory distress and no tenderness noted. Abdomen: Soft, nontender, nondistended, normal bowel sounds, no masses no hepatosplenomegaly noted. No fluid wave and no ascites. There is no guarding or rebound. Neuro: Alert and oriented to person, place and time. Normal motor skills, Displays no atrophy or tremors and exhibits normal muscle tone.  No focal neurological deficits noted cranial nerves II through XII grossly intact. No sensory deficit noted. Strength at baseline in bilateral upper and lower extremities. Gait normal. Musculoskeletal: No warm swelling or erythema around joints, no spinal tenderness noted. Psychiatric: Patient alert and oriented x3, good insight and cognition, good recent to remote recall. Mood, memory, affect and judgement normal Skin: Skin is warm and dry. No bruising, no ecchymosis and no rash noted. Pt is not diaphoretic. No erythema. No pallor     No results for input(s): NA, K, CL, CO2, GLUCOSE, BUN, CREATININE, CALCIUM, MG, PHOS in the last 72 hours. No results for input(s): AST, ALT, ALKPHOS, BILITOT, PROT, ALBUMIN in the last 72 hours. No results for input(s): LIPASE, AMYLASE in the last 72 hours.  Recent Labs  04/06/15 0554  WBC 16.7*  NEUTROABS 9.7*  HGB 7.1*  HCT 21.0*  MCV 90.9  PLT 429*     Total time spent including face to face and decision making was greater than 30 minutes  Signed: Rocko Fesperman A. 04/08/2015, 4:16 PM

## 2015-04-09 ENCOUNTER — Other Ambulatory Visit: Payer: Self-pay | Admitting: Family Medicine

## 2015-04-09 DIAGNOSIS — D571 Sickle-cell disease without crisis: Secondary | ICD-10-CM

## 2015-04-09 IMAGING — CR DG CHEST 2V
2 series · 2 of 2 positions shown · non-contrast
Comparison: 05/08/2012

CLINICAL DATA: Sickle cell disease, chest pain with inspiration

CHEST - 2 VIEW

[w chest pa]
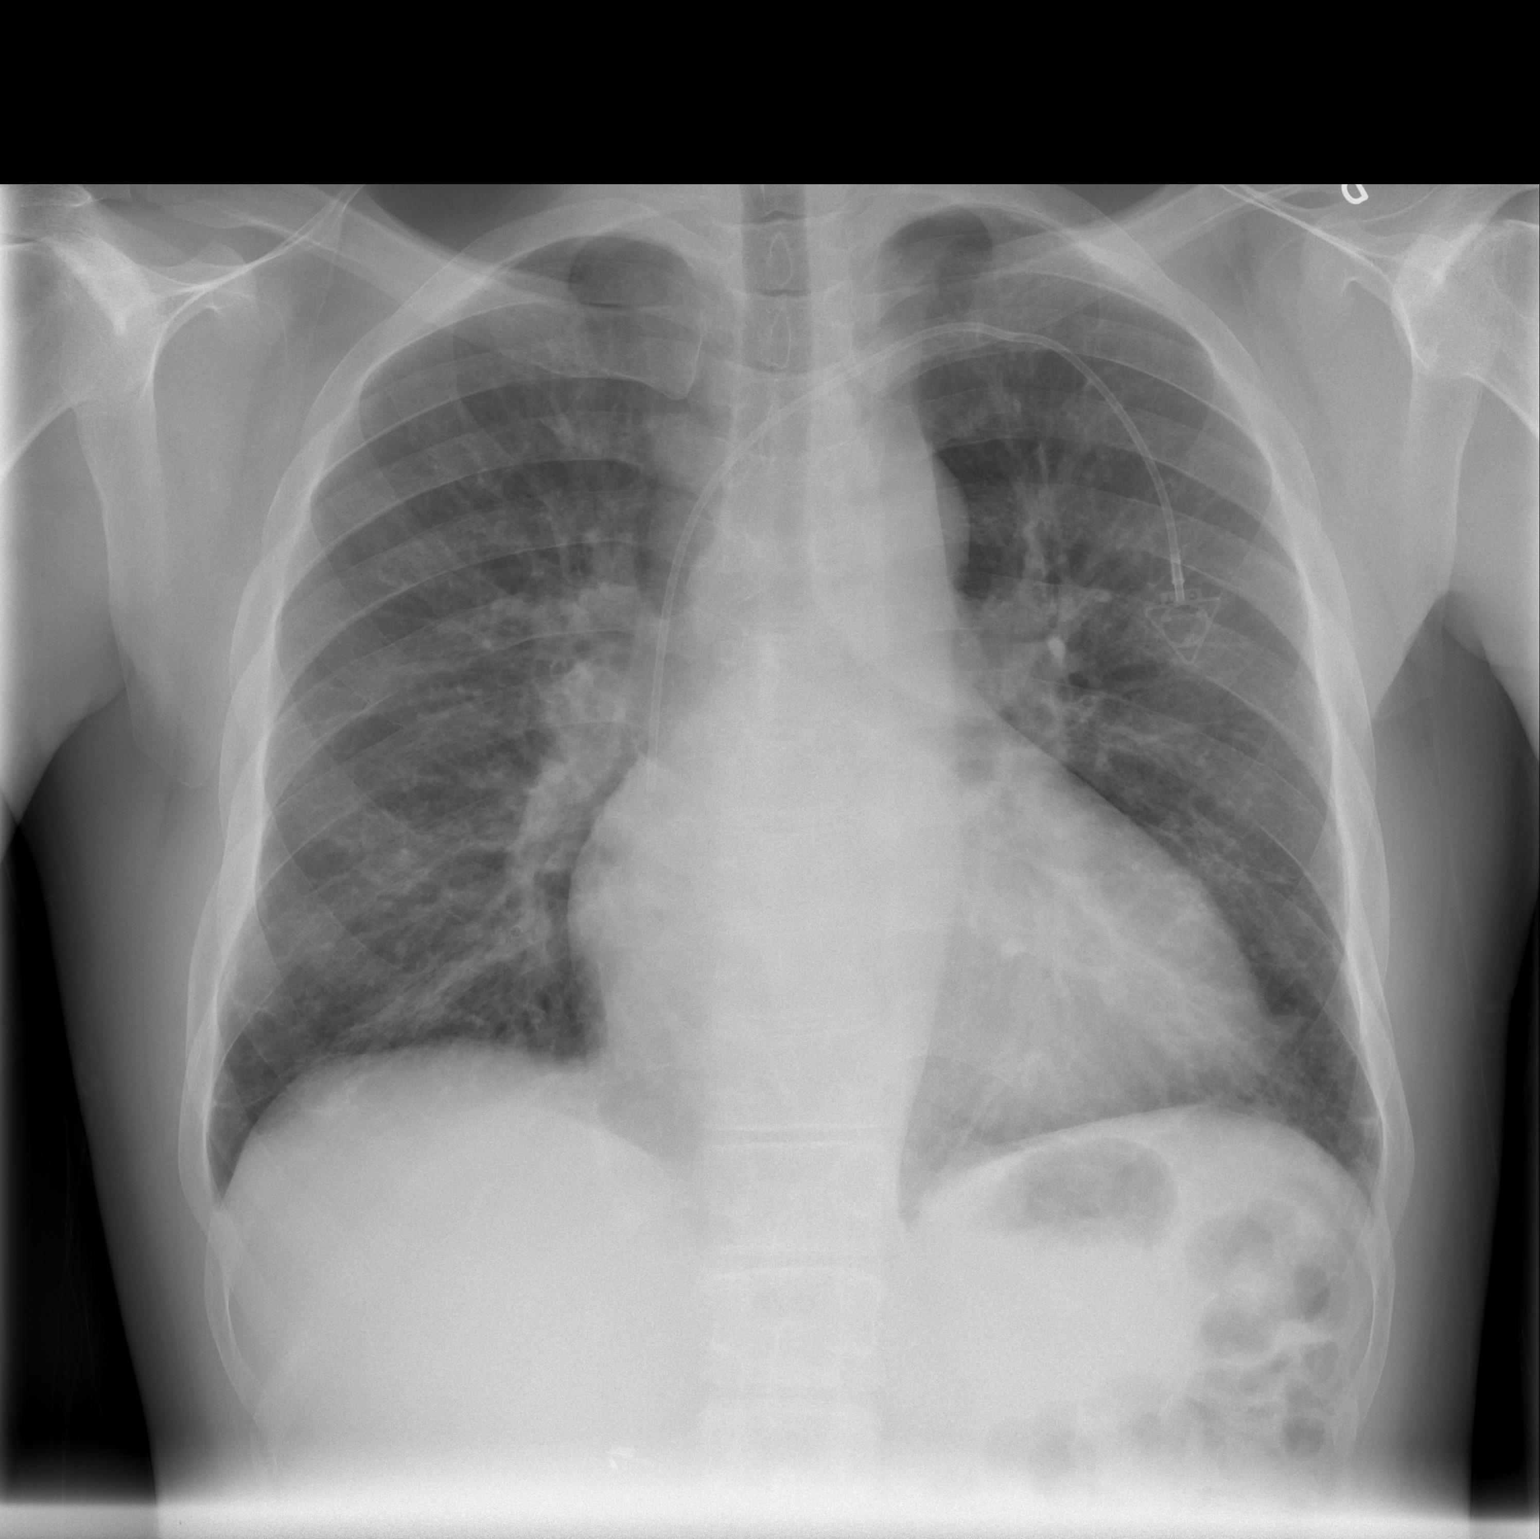

[w chest lat]
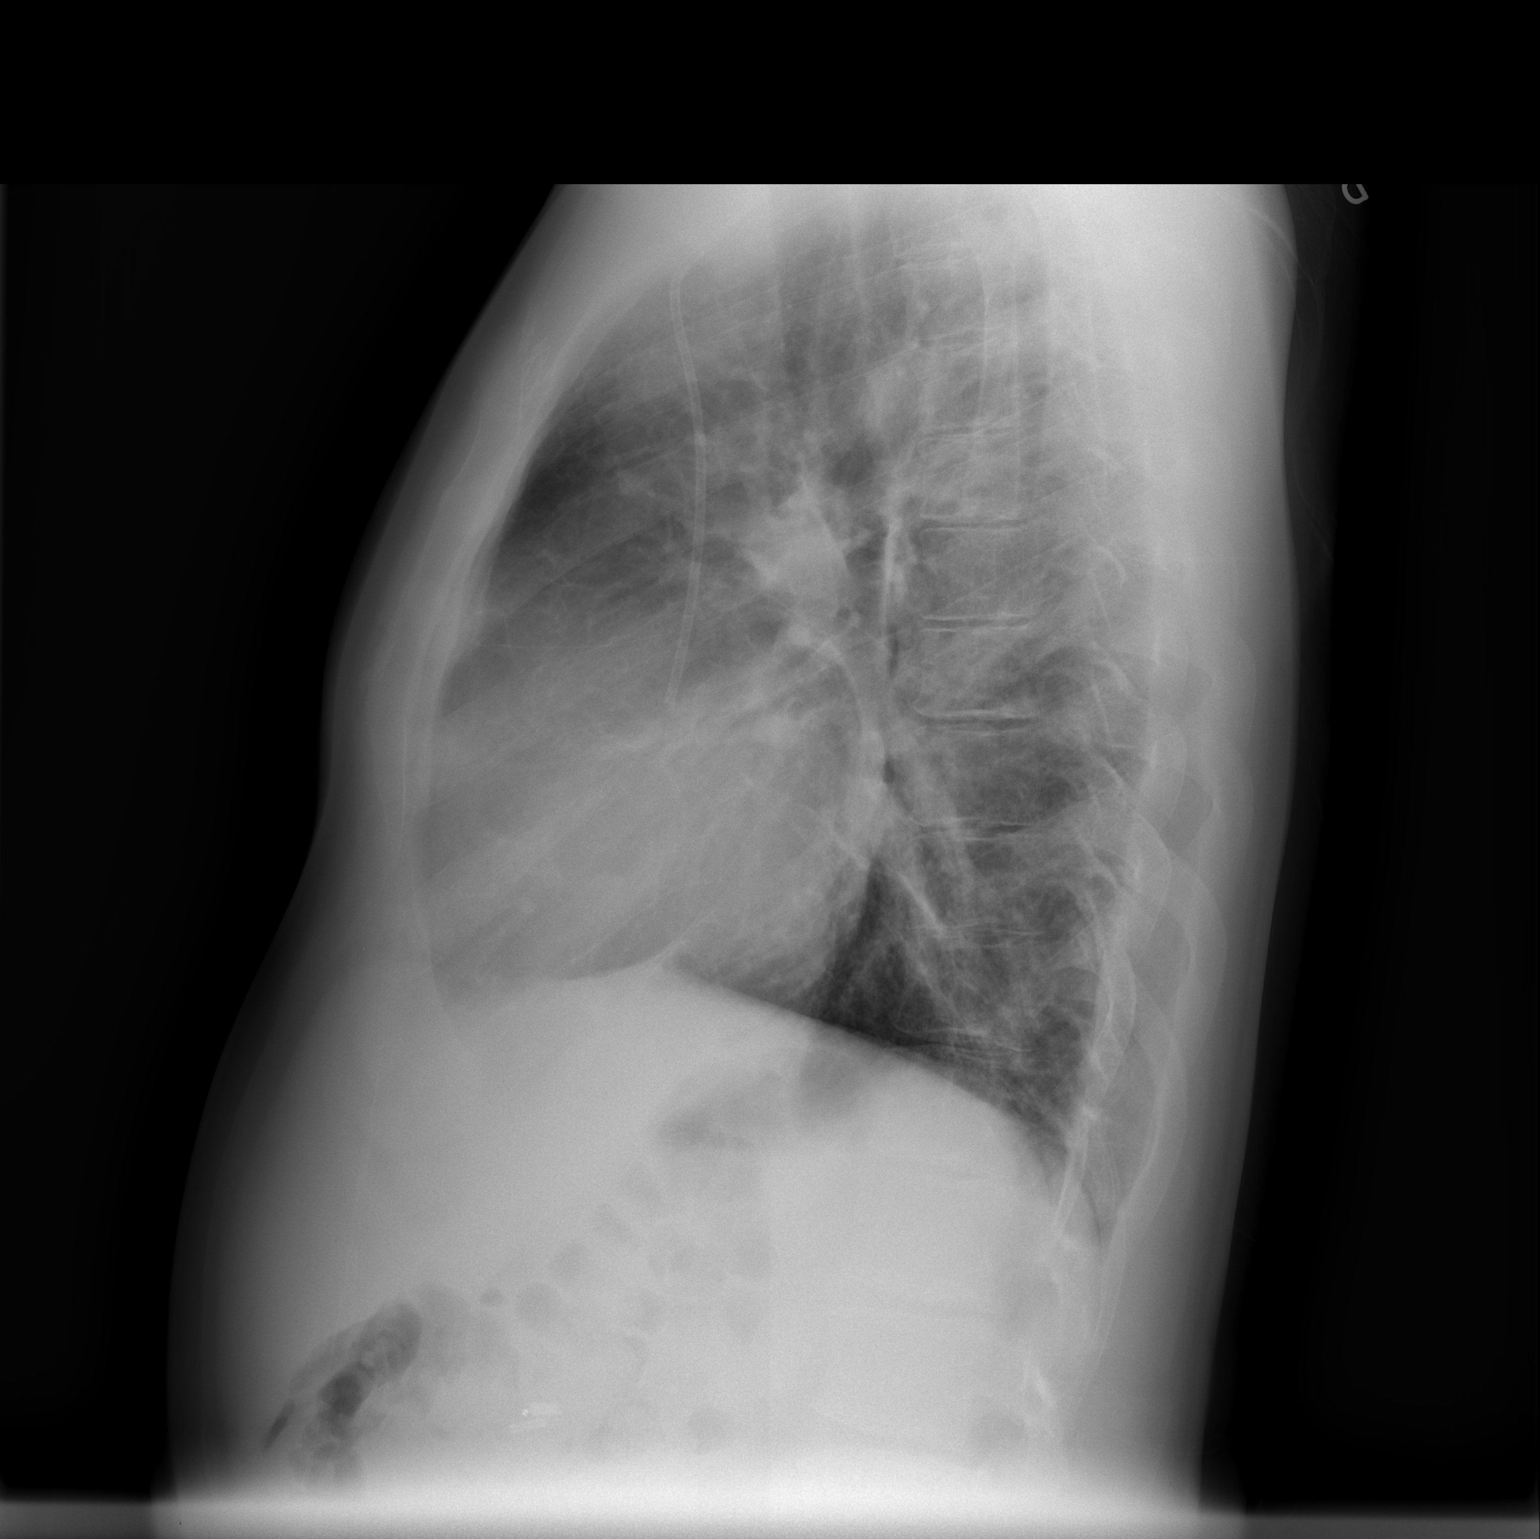

[2 of 2 positions shown; findings below may reference images not displayed]

FINDINGS: Left subclavian Port-A-Cath stable with tip projecting over SVC.
Enlargement of cardiac silhouette with pulmonary vascular
congestion.
Mediastinal contours stable.
Minimal chronic basilar interstitial prominence, stable.
No acute infiltrate, pleural effusion or pneumothorax.
Mild central peribronchial thickening.
IMPRESSION: Enlargement of cardiac silhouette with pulmonary vascular
congestion.
Chronic bronchitic and minimal bibasilar changes.
No acute abnormalities.

## 2015-04-09 NOTE — Progress Notes (Signed)
Called and spoke with patient. Lab appointment was scheduled for April 13, 2015 @ 9:15am. Thanks!

## 2015-04-10 ENCOUNTER — Other Ambulatory Visit: Payer: Self-pay | Admitting: Family Medicine

## 2015-04-10 DIAGNOSIS — G4700 Insomnia, unspecified: Secondary | ICD-10-CM

## 2015-04-10 MED ORDER — ZOLPIDEM TARTRATE 10 MG PO TABS: 10.0000 mg | ORAL_TABLET | Freq: Every evening | ORAL | Status: DC | PRN

## 2015-04-10 NOTE — Progress Notes (Signed)
Reviewed [place] Substance Reporting system prior to prescribing controlled medications, no inconsistencies noted.   Meds ordered this encounter  Medications  . zolpidem (AMBIEN) 10 MG tablet    Sig: Take 1 tablet (10 mg total) by mouth at bedtime as needed for sleep.    Dispense:  30 tablet    Refill:  0    Order Specific Question:  Supervising Provider    Answer:  Tresa Garter [id number]    Dorena Dew, FNP

## 2015-04-13 ENCOUNTER — Telehealth (HOSPITAL_COMMUNITY): Payer: Self-pay | Admitting: Internal Medicine

## 2015-04-13 ENCOUNTER — Encounter (HOSPITAL_COMMUNITY): Payer: Self-pay | Admitting: Hematology

## 2015-04-13 ENCOUNTER — Non-Acute Institutional Stay (HOSPITAL_COMMUNITY)
Admission: AD | Admit: 2015-04-13 | Discharge: 2015-04-13 | Disposition: A | Discharge status: Home / Self Care | Payer: Medicare Other | Source: Ambulatory Visit | Attending: Internal Medicine | Admitting: Internal Medicine

## 2015-04-13 ENCOUNTER — Other Ambulatory Visit: Payer: Self-pay

## 2015-04-13 DIAGNOSIS — D571 Sickle-cell disease without crisis: Secondary | ICD-10-CM

## 2015-04-13 DIAGNOSIS — Z79891 Long term (current) use of opiate analgesic: Secondary | ICD-10-CM | POA: Insufficient documentation

## 2015-04-13 DIAGNOSIS — Z7982 Long term (current) use of aspirin: Secondary | ICD-10-CM | POA: Insufficient documentation

## 2015-04-13 DIAGNOSIS — Z87891 Personal history of nicotine dependence: Secondary | ICD-10-CM | POA: Diagnosis not present

## 2015-04-13 DIAGNOSIS — Z79899 Other long term (current) drug therapy: Secondary | ICD-10-CM | POA: Insufficient documentation

## 2015-04-13 DIAGNOSIS — Z7901 Long term (current) use of anticoagulants: Secondary | ICD-10-CM | POA: Insufficient documentation

## 2015-04-13 DIAGNOSIS — I10 Essential (primary) hypertension: Secondary | ICD-10-CM | POA: Insufficient documentation

## 2015-04-13 DIAGNOSIS — D57819 Other sickle-cell disorders with crisis, unspecified: Secondary | ICD-10-CM | POA: Diagnosis not present

## 2015-04-13 DIAGNOSIS — R109 Unspecified abdominal pain: Secondary | ICD-10-CM | POA: Diagnosis present

## 2015-04-13 DIAGNOSIS — Z86711 Personal history of pulmonary embolism: Secondary | ICD-10-CM | POA: Diagnosis not present

## 2015-04-13 DIAGNOSIS — D5700 Hb-SS disease with crisis, unspecified: Secondary | ICD-10-CM

## 2015-04-13 DIAGNOSIS — I272 Pulmonary hypertension, unspecified: Secondary | ICD-10-CM

## 2015-04-13 DIAGNOSIS — Z96641 Presence of right artificial hip joint: Secondary | ICD-10-CM | POA: Insufficient documentation

## 2015-04-13 LAB — COMPREHENSIVE METABOLIC PANEL
ALBUMIN: 4.2 g/dL (ref 3.5–5.0)
ALT: 27 U/L (ref 17–63)
ANION GAP: 3 — AB (ref 5–15)
AST: 46 U/L — AB (ref 15–41)
Alkaline Phosphatase: 97 U/L (ref 38–126)
BILIRUBIN TOTAL: 3.7 mg/dL — AB (ref 0.3–1.2)
BUN: 13 mg/dL (ref 6–20)
CHLORIDE: 110 mmol/L (ref 101–111)
CO2: 25 mmol/L (ref 22–32)
Calcium: 8.7 mg/dL — ABNORMAL LOW (ref 8.9–10.3)
Creatinine, Ser: 0.65 mg/dL (ref 0.61–1.24)
GFR calc Af Amer: >60 mL/min (ref >60)
GLUCOSE: 108 mg/dL — AB (ref 65–99)
POTASSIUM: 4.3 mmol/L (ref 3.5–5.1)
Sodium: 138 mmol/L (ref 135–145)
TOTAL PROTEIN: 7.6 g/dL (ref 6.5–8.1)

## 2015-04-13 LAB — RETICULOCYTES
RBC.: 2.1 MIL/uL — AB (ref 4.22–5.81)
RETIC COUNT ABSOLUTE: 98.7 10*3/uL (ref 19.0–186.0)
RETIC CT PCT: 4.7 % — AB (ref 0.4–3.1)

## 2015-04-13 LAB — CBC WITH DIFFERENTIAL/PLATELET
BASOS ABS: 0.2 10*3/uL — AB (ref 0.0–0.1)
Basophils Relative: 1 %
EOS ABS: 0.5 10*3/uL (ref 0.0–0.7)
Eosinophils Relative: 3 %
HEMATOCRIT: 18.6 % — AB (ref 39.0–52.0)
HEMOGLOBIN: 6.2 g/dL — AB (ref 13.0–17.0)
LYMPHS PCT: 13 %
Lymphs Abs: 2.1 10*3/uL (ref 0.7–4.0)
MCH: 29.5 pg (ref 26.0–34.0)
MCHC: 33.3 g/dL (ref 30.0–36.0)
MCV: 88.6 fL (ref 78.0–100.0)
MONOS PCT: 16 %
Monocytes Absolute: 2.6 10*3/uL — ABNORMAL HIGH (ref 0.1–1.0)
NEUTROS ABS: 10.8 10*3/uL — AB (ref 1.7–7.7)
NEUTROS PCT: 67 %
Platelets: 521 10*3/uL — ABNORMAL HIGH (ref 150–400)
RBC: 2.1 MIL/uL — AB (ref 4.22–5.81)
RDW: 21 % — ABNORMAL HIGH (ref 11.5–15.5)
WBC: 16.2 10*3/uL — AB (ref 4.0–10.5)

## 2015-04-13 LAB — HEMOGLOBIN AND HEMATOCRIT, BLOOD
HCT: 21.9 % — ABNORMAL LOW (ref 39.0–52.0)
Hemoglobin: 7.2 g/dL — ABNORMAL LOW (ref 13.0–17.0)

## 2015-04-13 LAB — PREPARE RBC (CROSSMATCH)

## 2015-04-13 MED ORDER — HYDROMORPHONE 1 MG/ML IV SOLN
INTRAVENOUS | Status: DC
  Administered 2015-04-13: 11.2 mg via INTRAVENOUS
  Administered 2015-04-13: 10:00:00 via INTRAVENOUS
  Administered 2015-04-13: 7 mg via INTRAVENOUS
  Filled 2015-04-13: qty 25

## 2015-04-13 MED ORDER — POTASSIUM CHLORIDE CRYS ER 20 MEQ PO TBCR: 20.0000 meq | EXTENDED_RELEASE_TABLET | Freq: Every morning | ORAL | Status: DC

## 2015-04-13 MED ORDER — HEPARIN SOD (PORK) LOCK FLUSH 100 UNIT/ML IV SOLN
500.0000 [IU] | INTRAVENOUS | Status: AC | PRN
  Administered 2015-04-13: 500 [IU]
  Filled 2015-04-13: qty 5

## 2015-04-13 MED ORDER — SODIUM CHLORIDE 0.9 % IV SOLN
12.5000 mg | Freq: Four times a day (QID) | INTRAVENOUS | Status: DC | PRN
  Administered 2015-04-13: 12.5 mg via INTRAVENOUS
  Filled 2015-04-13 (×3): qty 0.25

## 2015-04-13 MED ORDER — ONDANSETRON HCL 4 MG/2ML IJ SOLN: 4.0000 mg | Freq: Four times a day (QID) | INTRAMUSCULAR | Status: DC | PRN

## 2015-04-13 MED ORDER — HEPARIN SOD (PORK) LOCK FLUSH 100 UNIT/ML IV SOLN: 500.0000 [IU] | INTRAVENOUS | Status: DC | PRN

## 2015-04-13 MED ORDER — LISINOPRIL 10 MG PO TABS: 10.0000 mg | ORAL_TABLET | Freq: Every day | ORAL | Status: DC

## 2015-04-13 MED ORDER — DIPHENHYDRAMINE HCL 12.5 MG/5ML PO ELIX: 12.5000 mg | ORAL_SOLUTION | Freq: Four times a day (QID) | ORAL | Status: DC | PRN

## 2015-04-13 MED ORDER — SODIUM CHLORIDE 0.9% FLUSH
10.0000 mL | INTRAVENOUS | Status: AC | PRN
  Administered 2015-04-13: 10 mL

## 2015-04-13 MED ORDER — DEXTROSE-NACL 5-0.45 % IV SOLN
INTRAVENOUS | Status: DC
  Administered 2015-04-13: 10:00:00 via INTRAVENOUS

## 2015-04-13 MED ORDER — KETOROLAC TROMETHAMINE 30 MG/ML IJ SOLN
30.0000 mg | Freq: Once | INTRAMUSCULAR | Status: AC
  Administered 2015-04-13: 30 mg via INTRAVENOUS
  Filled 2015-04-13: qty 1

## 2015-04-13 MED ORDER — SODIUM CHLORIDE 0.9% FLUSH: 9.0000 mL | INTRAVENOUS | Status: DC | PRN

## 2015-04-13 MED ORDER — SODIUM CHLORIDE 0.9 % IV SOLN
Freq: Once | INTRAVENOUS | Status: AC
  Administered 2015-04-13: 13:00:00 via INTRAVENOUS

## 2015-04-13 MED ORDER — NALOXONE HCL 0.4 MG/ML IJ SOLN: 0.4000 mg | INTRAMUSCULAR | Status: DC | PRN

## 2015-04-13 NOTE — Discharge Instructions (Signed)
Sickle Cell Anemia, Adult Sickle cell anemia is a condition in which red blood cells have an abnormal "sickle" shape. This abnormal shape shortens the cells' life span, which results in a lower than normal concentration of red blood cells in the blood. The sickle shape also causes the cells to clump together and block free blood flow through the blood vessels. As a result, the tissues and organs of the body do not receive enough oxygen. Sickle cell anemia causes organ damage and pain and increases the risk of infection. CAUSES  Sickle cell anemia is a genetic disorder. Those who receive two copies of the gene have the condition, and those who receive one copy have the trait. RISK FACTORS The sickle cell gene is most common in people whose families originated in [place]. Other areas of the globe where sickle cell trait occurs include the Mediterranean, [place] and [place], the [place], and the [place].  SIGNS AND SYMPTOMS  Pain, especially in the extremities, back, chest, or abdomen (common). The pain may start suddenly or may develop following an illness, especially if there is dehydration. Pain can also occur due to overexertion or exposure to extreme temperature changes.  Frequent severe bacterial infections, especially certain types of pneumonia and meningitis.  Pain and swelling in the hands and feet.  Decreased activity.   Loss of appetite.   Change in behavior.  Headaches.  Seizures.  Shortness of breath or difficulty breathing.  Vision changes.  Skin ulcers. Those with the trait may not have symptoms or they may have mild symptoms.  DIAGNOSIS  Sickle cell anemia is diagnosed with blood tests that demonstrate the genetic trait. It is often diagnosed during the newborn period, due to mandatory testing nationwide. A variety of blood tests, X-rays, CT scans, MRI scans, ultrasounds, and lung function tests may also be done to monitor the condition. TREATMENT  Sickle  cell anemia may be treated with:  Medicines. You may be given pain medicines, antibiotic medicines (to treat and prevent infections) or medicines to increase the production of certain types of hemoglobin.  Fluids.  Oxygen.  Blood transfusions. HOME CARE INSTRUCTIONS   Drink enough fluid to keep your urine clear or pale yellow. Increase your fluid intake in hot weather and during exercise.  Do not smoke. Smoking lowers oxygen levels in the blood.   Only take over-the-counter or prescription medicines for pain, fever, or discomfort as directed by your health care provider.  Take antibiotics as directed by your health care provider. Make sure you finish them it even if you start to feel better.   Take supplements as directed by your health care provider.   Consider wearing a medical alert bracelet. This tells anyone caring for you in an emergency of your condition.   When traveling, keep your medical information, health care provider's names, and the medicines you take with you at all times.   If you develop a fever, do not take medicines to reduce the fever right away. This could cover up a problem that is developing. Notify your health care provider.  Keep all follow-up appointments with your health care provider. Sickle cell anemia requires regular medical care. SEEK MEDICAL CARE IF: You have a fever. SEEK IMMEDIATE MEDICAL CARE IF:   You feel dizzy or faint.   You have new abdominal pain, especially on the left side near the stomach area.   You develop a persistent, often uncomfortable and painful penile erection (priapism). If this is not treated immediately it   will lead to impotence.   You have numbness your arms or legs or you have a hard time moving them.   You have a hard time with speech.   You have a fever or persistent symptoms for more than 2-3 days.   You have a fever and your symptoms suddenly get worse.   You have signs or symptoms of infection.  These include:   Chills.   Abnormal tiredness (lethargy).   Irritability.   Poor eating.   Vomiting.   You develop pain that is not helped with medicine.   You develop shortness of breath.  You have pain in your chest.   You are coughing up pus-like or bloody sputum.   You develop a stiff neck.  Your feet or hands swell or have pain.  Your abdomen appears bloated.  You develop joint pain. MAKE SURE YOU:  Understand these instructions.   This information is not intended to replace advice given to you by your health care provider. Make sure you discuss any questions you have with your health care provider.   Document Released: 04/27/2005 Document Revised: 02/07/2014 Document Reviewed: 08/29/2012 Elsevier Interactive Patient Education 2016 [employer] Inc[employer] Sickle Cell Anemia, Adult Sickle cell anemia is a condition in which red blood cells have an abnormal "sickle" shape. This abnormal shape shortens the cells' life span, which results in a lower than normal concentration of red blood cells in the blood. The sickle shape also causes the cells to clump together and block free blood flow through the blood vessels. As a result, the tissues and organs of the body do not receive enough oxygen. Sickle cell anemia causes organ damage and pain and increases the risk of infection. CAUSES  Sickle cell anemia is a genetic disorder. Those who receive two copies of the gene have the condition, and those who receive one copy have the trait. RISK FACTORS The sickle cell gene is most common in people whose families originated in [place]. Other areas of the globe where sickle cell trait occurs include the Mediterranean, [place] and [place], the [place], and the [place].  SIGNS AND SYMPTOMS  Pain, especially in the extremities, back, chest, or abdomen (common). The pain may start suddenly or may develop following an illness, especially if there is dehydration. Pain can also  occur due to overexertion or exposure to extreme temperature changes.  Frequent severe bacterial infections, especially certain types of pneumonia and meningitis.  Pain and swelling in the hands and feet.  Decreased activity.   Loss of appetite.   Change in behavior.  Headaches.  Seizures.  Shortness of breath or difficulty breathing.  Vision changes.  Skin ulcers. Those with the trait may not have symptoms or they may have mild symptoms.  DIAGNOSIS  Sickle cell anemia is diagnosed with blood tests that demonstrate the genetic trait. It is often diagnosed during the newborn period, due to mandatory testing nationwide. A variety of blood tests, X-rays, CT scans, MRI scans, ultrasounds, and lung function tests may also be done to monitor the condition. TREATMENT  Sickle cell anemia may be treated with:  Medicines. You may be given pain medicines, antibiotic medicines (to treat and prevent infections) or medicines to increase the production of certain types of hemoglobin.  Fluids.  Oxygen.  Blood transfusions. HOME CARE INSTRUCTIONS   Drink enough fluid to keep your urine clear or pale yellow. Increase your fluid intake in hot weather and during exercise.  Do not smoke. Smoking lowers oxygen levels   in the blood.   Only take over-the-counter or prescription medicines for pain, fever, or discomfort as directed by your health care provider.  Take antibiotics as directed by your health care provider. Make sure you finish them it even if you start to feel better.   Take supplements as directed by your health care provider.   Consider wearing a medical alert bracelet. This tells anyone caring for you in an emergency of your condition.   When traveling, keep your medical information, health care provider's names, and the medicines you take with you at all times.   If you develop a fever, do not take medicines to reduce the fever right away. This could cover up a problem  that is developing. Notify your health care provider.  Keep all follow-up appointments with your health care provider. Sickle cell anemia requires regular medical care. SEEK MEDICAL CARE IF: You have a fever. SEEK IMMEDIATE MEDICAL CARE IF:   You feel dizzy or faint.   You have new abdominal pain, especially on the left side near the stomach area.   You develop a persistent, often uncomfortable and painful penile erection (priapism). If this is not treated immediately it will lead to impotence.   You have numbness your arms or legs or you have a hard time moving them.   You have a hard time with speech.   You have a fever or persistent symptoms for more than 2-3 days.   You have a fever and your symptoms suddenly get worse.   You have signs or symptoms of infection. These include:   Chills.   Abnormal tiredness (lethargy).   Irritability.   Poor eating.   Vomiting.   You develop pain that is not helped with medicine.   You develop shortness of breath.  You have pain in your chest.   You are coughing up pus-like or bloody sputum.   You develop a stiff neck.  Your feet or hands swell or have pain.  Your abdomen appears bloated.  You develop joint pain. MAKE SURE YOU:  Understand these instructions.   This information is not intended to replace advice given to you by your health care provider. Make sure you discuss any questions you have with your health care provider.   Document Released: 04/27/2005 Document Revised: 02/07/2014 Document Reviewed: 08/29/2012 Elsevier Interactive Patient Education 2016 Elsevier Inc.  

## 2015-04-13 NOTE — Progress Notes (Signed)
Discharge instructions given to patient.  Patient states pain is currently 4/10 on pain scale.  Port flushed and de-accessed per protocol.  IV removed without difficulty.  Patient verbalizes understanding of follow up appointments.  Patient ambulatory at discharge.

## 2015-04-13 NOTE — Telephone Encounter (Signed)
Refills for potassium and lisinopril has been sent into pharmacy. Thanks!

## 2015-04-13 NOTE — Progress Notes (Signed)
CRITICAL VALUE ALERT  Critical value received:  Hbg 6.2  Date of notification:  04/13/2015  Time of notification:  11:19  Critical value read back:yes  Nurse who received alert: Roberto Scales, RN  [place] Hollis, NP notified verbally

## 2015-04-13 NOTE — Telephone Encounter (Signed)
Pt called and states experiencing pain in side and back; states that he is not experiencing relief by taking home pain medications; pt denies chest pain, nausea, vomiting, diarrhea, abdominal pain, or shortness of breath; NP notified; pt informed that he may come to the day center for evaluation; pt verbalizes understanding

## 2015-04-13 NOTE — H&P (Signed)
[facility] History and Physical   Date: 04/13/2015  Patient name: YAHYE ISOBE Medical record number: [id number] Date of birth: 1980-03-28 Age: 35 y.o. Gender: male PCP: [name], MD  Attending physician: Tresa Garter, MD  Chief Complaint: Left flank pain  History of Present Illness: Mr. Laik Kellum, a 35 year old male with a history of sickle cell anemia, HbSS presents complaining of right and left flank pain that is consistent with sickle cell anemia. He maintains that pain started several days ago and has not been controlled on home medication regimen since that time. Current pain intensity is 8/10 described as constant and throbbing. He last had Dilaudid 4 mg around 7 am with minimal relief. He is taking all other medications consistently. He denies headache, chest pains, fatigue, dysuria, nausea, vomiting, or diarrhea.  Meds: Prescriptions prior to admission  Medication Sig Dispense Refill Last Dose  . aspirin 81 MG chewable tablet Chew 1 tablet (81 mg total) by mouth daily. 30 tablet 11 04/12/2015 at Unknown time  . Cholecalciferol (VITAMIN D) 2000 units tablet Take 1 tablet (2,000 Units total) by mouth daily. 90 tablet 3 04/12/2015 at Unknown time  . enoxaparin (LOVENOX) 120 MG/0.8ML injection Inject 0.8 mLs (120 mg total) into the skin daily. 30 Syringe 2 04/12/2015 at Unknown time  . folic acid (FOLVITE) 1 MG tablet Take 1 tablet (1 mg total) by mouth every morning. 30 tablet 11 04/12/2015 at Unknown time  . gabapentin (NEURONTIN) 300 MG capsule Take 1 capsule (300 mg total) by mouth 3 (three) times daily. 30 capsule 0 04/12/2015 at Unknown time  . HYDROmorphone (DILAUDID) 4 MG tablet Take 1 tablet (4 mg total) by mouth every 4 (four) hours as needed for severe pain. 90 tablet 0 04/13/2015 at 0700  . hydroxyurea (HYDREA) 500 MG capsule Take 1 capsule (500 mg total) by mouth daily. May take with food to minimize GI side effects. 30 capsule 0  04/12/2015 at Unknown time  . lisinopril (PRINIVIL,ZESTRIL) 10 MG tablet Take 1 tablet (10 mg total) by mouth daily. 30 tablet 5 04/12/2015 at Unknown time  . metoprolol succinate (TOPROL-XL) 50 MG 24 hr tablet Take 1 tablet (50 mg total) by mouth daily. 30 tablet 3 04/12/2015 at Unknown time  . morphine (MS CONTIN) 30 MG 12 hr tablet Take 1 tablet (30 mg total) by mouth every 12 (twelve) hours. 60 tablet 0 04/13/2015 at 0700  . zolpidem (AMBIEN) 10 MG tablet Take 1 tablet (10 mg total) by mouth at bedtime as needed for sleep. 30 tablet 0 Past Month at Unknown time  . cyclobenzaprine (FLEXERIL) 5 MG tablet Take 1 tablet (5 mg total) by mouth 3 (three) times daily as needed for muscle spasms. (Patient not taking: Reported on 03/06/2015) 20 tablet 0 More than a month at Unknown time  . potassium chloride SA (K-DUR,KLOR-CON) 20 MEQ tablet Take 1 tablet (20 mEq total) by mouth every morning. 30 tablet 3 More than a month at Unknown time  . zolpidem (AMBIEN) 10 MG tablet Take 1 tablet (10 mg total) by mouth at bedtime as needed for sleep. 30 tablet 0     Allergies: Review of patient's allergies indicates no known allergies. Past Medical History  Diagnosis Date  . Sickle cell anemia (HCC)   . Blood transfusion   . Acute embolism and thrombosis of right internal jugular vein ([place])   . Hypokalemia   . Mood disorder ([place])   . History  of pulmonary embolus (PE)   . Avascular necrosis (HCC)     Right Hip  . Leukocytosis     Chronic  . Thrombocytosis (HCC)     Chronic  . Hypertension   . History of Clostridium difficile infection   . Uses marijuana   . Chronic anticoagulation   . Functional asplenia   . Former smoker   . Second hand tobacco smoke exposure   . Alcohol consumption of one to four drinks per day   . Noncompliance with medication regimen   . Sickle-cell crisis with associated acute chest syndrome ([place]) 05/13/2013  . Acute chest syndrome ([place]) 06/18/2013  . Demand ischemia ([place]) 01/02/2014   . Pulmonary hypertension [facility])    Past Surgical History  Procedure Laterality Date  . Right hip replacement      08/2006  . Cholecystectomy      01/2008  . Porta cath placement    . Porta cath removal    . Umbilical hernia repair      01/2008  . Excision of left periauricular cyst      10/2009  . Excision of right ear lobe cyst with primary closur      11/2007  . Portacath placement  01/05/2012    Procedure: INSERTION PORT-A-CATH;  Surgeon: Odis Hollingshead, MD;  Location: [facility];  Service: General;  Laterality: N/A;  ultrasound guiced port a cath insertion with fluoroscopy   Family History  Problem Relation Age of Onset  . Sickle cell trait Mother   . Depression Mother   . Diabetes Mother   . Sickle cell trait Father   . Sickle cell trait Brother    Social History   Social History  . Marital Status: Single    Spouse Name: N/A  . Number of Children: 0  . Years of Education: 13   Occupational History  . 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 Smoking status: Former Smoker -- 13 years  . Smokeless tobacco: Never Used  . Alcohol Use: No  . Drug Use: 2.00 per week    Special: Marijuana     Comment: Marijuana weekly  . Sexual Activity: Not on file     Comment: month ago   Other Topics Concern  . Not on file   Social History Narrative   Lives in an apartment.  Single.  Lives alone but has a girlfriend that helps care for him.  Does not use any assist devices.        [name]  [phone] Mom, emergency contact      [facility]:   Patient continuing to live in her apartment in town alone. 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    Review of Systems: Eyes: positive for icterus Ears, nose, mouth, throat, and face: negative Respiratory: positive for dyspnea on exertion, negative for emphysema, sputum and wheezing Cardiovascular: negative Gastrointestinal: negative for abdominal pain, constipation  and dysphagia Genitourinary:positive for dysuria and frequency Integument/breast: negative Hematologic/lymphatic: negative Musculoskeletal:positive for bone pain and myalgias Neurological: negative Behavioral/Psych: negative Endocrine: negative  Physical Exam: Blood pressure 117/68, pulse 91, temperature 98.7 F (37.1 C), temperature source Oral, resp. rate 20, height 6' (1.829 m), weight 170 lb (77.111 kg), SpO2 93 %.   General Appearance:    Alert, cooperative, mild distress, appears stated age  Head:    Normocephalic, without obvious abnormality, atraumatic  Eyes:    PERRL,  conjunctiva/corneas clear, EOM's intact, fundi    benign, scleral icterus      Ears:    Normal TM's and external ear canals, both ears  Nose:   Nares normal, septum midline, mucosa normal, no drainage    or sinus tenderness  Throat:   Lips, mucosa, and tongue normal; teeth and gums normal  Neck:   Supple, symmetrical, trachea midline, no adenopathy;       thyroid:  No enlargement/tenderness/nodules; no carotid   bruit or JVD  Back:     Symmetric, no curvature, ROM normal, no CVA tenderness  Lungs:     Clear to auscultation bilaterally, respirations unlabored  Chest wall:    No tenderness or deformity  Heart:    Regular rate and rhythm, S1 and S2 normal, Murmur, rub   or gallop  Extremities:   Extremities normal, atraumatic, no cyanosis or edema  Pulses:   2+ and symmetric all extremities  Skin:   Skin color, texture, turgor normal, no rashes or lesions  Lymph nodes:   Cervical, supraclavicular, and axillary nodes normal  Neurologic:   CNII-XII intact. Normal strength, sensation and reflexes      throughout    Lab results: No results found. However, due to the size of the patient record, not all encounters were searched. Please check Results Review for a complete set of results.  Imaging results:  No results found. Assessment & Plan:  Patient will be admitted to the day infusion center for extended  observation  Start IV D5.45 for cellular rehydration at 125/hr  Start Toradol 30 mg IV times 59for inflammation.  Start Dilaudid PCA High Concentration per weight based protocol.   Patient will be re-evaluated for pain intensity in the context of function and relationship to                  baseline as care progresses.  If no significant pain relief, will transfer patient to inpatient services for a higher level of care.   Will check CMP,  Retiiculocytes, and CBC w/differential  Start oxygen via n/c at 2 liters per home dose  Larri Brewton M 04/13/2015, 9:24 AM

## 2015-04-13 NOTE — Progress Notes (Signed)
7mg  of Dilaudid wasted from PCA.  Patient ambulatory out of clinic at 1617.

## 2015-04-13 NOTE — Discharge Summary (Signed)
[name] Discharge Summary   Patient ID: [name] MRN: [id number] DOB/AGE: [birth year] 35 y.o.  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     cyclobenzaprine 5 MG tablet  Commonly known as:  FLEXERIL  Take 1 tablet (5 mg total) by mouth 3 (three) times daily as needed for muscle spasms.     enoxaparin 120 MG/0.8ML injection  Commonly known as:  LOVENOX  Inject 0.8 mLs (120 mg total) into the skin daily.     folic acid 1 MG tablet  Commonly known as:  FOLVITE  Take 1 tablet (1 mg total) by mouth every morning.     gabapentin 300 MG capsule  Commonly known as:  NEURONTIN  Take 1 capsule (300 mg total) by mouth 3 (three) times daily.     HYDROmorphone 4 MG tablet  Commonly known as:  DILAUDID  Take 1 tablet (4 mg total) by mouth every 4 (four) hours as needed for severe pain.     hydroxyurea 500 MG capsule  Commonly known as:  HYDREA  Take 1 capsule (500 mg total) by mouth daily. May take with food to minimize GI side effects.     lisinopril 10 MG tablet  Commonly known as:  PRINIVIL,ZESTRIL  Take 1 tablet (10 mg total) by mouth daily.     metoprolol succinate 50 MG 24 hr tablet  Commonly known as:  TOPROL-XL  Take 1 tablet (50 mg total) by mouth daily.     morphine 30 MG 12 hr tablet  Commonly known as:  MS CONTIN  Take 1 tablet (30 mg total) by mouth every 12 (twelve) hours.     potassium chloride SA 20 MEQ tablet  Commonly known as:  K-DUR,KLOR-CON  Take 1 tablet (20 mEq total) by mouth every morning.     Vitamin D 2000 units tablet  Take 1 tablet (2,000 Units total) by mouth daily.     zolpidem 10 MG tablet  Commonly known as:  AMBIEN   Take 1 tablet (10 mg total) by mouth at bedtime as needed for sleep.     zolpidem 10 MG tablet  Commonly known as:  AMBIEN  Take 1 tablet (10 mg total) by mouth at bedtime as needed for sleep.         Consults:  None  Significant Diagnostic Studies:  Dg Chest 2 View  03/19/2015  CLINICAL DATA:  Gerald cell crisis EXAM: CHEST  2 VIEW COMPARISON:  03/04/2015 FINDINGS: There is mild bilateral interstitial thickening. There is no focal parenchymal opacity. There is no pleural effusion or pneumothorax. There is stable cardiomegaly. There is a left-sided Port-A-Cath in unchanged position. The osseous structures are unremarkable. IMPRESSION: No acute cardiopulmonary disease. Cardiomegaly with mild pulmonary vascular congestion. Electronically Signed   By: [name]   On: 03/19/2015 09:44     [name] Course: [name] was admitted to the [name] for extended observation.  He had a critical hemoglobin of 6.2, which was decreased from 7.1 eight days ago. Sent a post transfusion H&H, will review as it becomes available.  Patient was having 8-9/10 pain  on admission.  Patient was also started on on high concentration PCA dilaudid, he used a total of 18 mg with  30 demands and  26 deliveries. Pain intensity decreased to 5/10 prior to discharge.  Patient is alert, oriented, and ambulating. He states that he can manage at home on current medication regimen.  Patient has an appointment scheduled for 04/27/2015, he expressed understanding.  Physical Exam at Discharge:    BP 124/85 mmHg  Pulse 79  Temp(Src) 98.8 F (37.1 C) (Oral)  Resp 20  Ht 6' (1.829 m)  Wt 170 lb (77.111 kg)  BMI 23.05 kg/m2  SpO2 94%  General Appearance:    Alert, cooperative, no distress, appears stated age  Head:    Normocephalic, without obvious abnormality, atraumatic     Lungs:     Clear to auscultation bilaterally, respirations unlabored  Chest wall:    No  tenderness or deformity  Heart:    Irregular rate and rhythm, S1 and S2 normal, Murmur, rub   or gallop  Abdomen:     Soft, non-tender, bowel sounds active all four quadrants,    no masses, no organomegaly  Extremities:   Extremities normal, atraumatic, no cyanosis or edema  Pulses:   2+ and symmetric all extremities  Skin:   Skin color, texture, turgor normal, no rashes or lesions  Lymph nodes:   Cervical, supraclavicular, and axillary nodes normal  Neurologic:   CNII-XII intact. Normal strength, sensation and reflexes      throughout    Disposition at Discharge: 01-Home or Self Care  Discharge Orders: Discharge Instructions    Discharge patient    Complete by:  As directed            Condition at Discharge:   Stable  Time spent on Discharge:  15 minutes  Signed: Gracey [name] M 04/13/2015, 4:04 PM

## 2015-04-14 LAB — TYPE AND SCREEN
ABO/RH(D): O POS
ANTIBODY SCREEN: NEGATIVE
Unit division: 0

## 2015-04-17 ENCOUNTER — Encounter (HOSPITAL_COMMUNITY): Payer: Self-pay | Admitting: *Deleted

## 2015-04-17 ENCOUNTER — Telehealth (HOSPITAL_COMMUNITY): Payer: Self-pay | Admitting: *Deleted

## 2015-04-17 ENCOUNTER — Non-Acute Institutional Stay (HOSPITAL_COMMUNITY)
Admission: AD | Admit: 2015-04-17 | Discharge: 2015-04-17 | Disposition: A | Discharge status: Home / Self Care | Payer: Medicare Other | Source: Ambulatory Visit | Attending: Internal Medicine | Admitting: Internal Medicine

## 2015-04-17 DIAGNOSIS — D5700 Hb-SS disease with crisis, unspecified: Secondary | ICD-10-CM | POA: Diagnosis not present

## 2015-04-17 DIAGNOSIS — Z86711 Personal history of pulmonary embolism: Secondary | ICD-10-CM | POA: Insufficient documentation

## 2015-04-17 DIAGNOSIS — Z79891 Long term (current) use of opiate analgesic: Secondary | ICD-10-CM | POA: Insufficient documentation

## 2015-04-17 DIAGNOSIS — Z79899 Other long term (current) drug therapy: Secondary | ICD-10-CM | POA: Insufficient documentation

## 2015-04-17 DIAGNOSIS — Z96641 Presence of right artificial hip joint: Secondary | ICD-10-CM | POA: Diagnosis not present

## 2015-04-17 DIAGNOSIS — Z87891 Personal history of nicotine dependence: Secondary | ICD-10-CM | POA: Insufficient documentation

## 2015-04-17 DIAGNOSIS — D57819 Other sickle-cell disorders with crisis, unspecified: Secondary | ICD-10-CM

## 2015-04-17 DIAGNOSIS — Z7901 Long term (current) use of anticoagulants: Secondary | ICD-10-CM | POA: Insufficient documentation

## 2015-04-17 DIAGNOSIS — R109 Unspecified abdominal pain: Secondary | ICD-10-CM | POA: Diagnosis present

## 2015-04-17 DIAGNOSIS — I10 Essential (primary) hypertension: Secondary | ICD-10-CM | POA: Diagnosis not present

## 2015-04-17 DIAGNOSIS — Z7982 Long term (current) use of aspirin: Secondary | ICD-10-CM | POA: Insufficient documentation

## 2015-04-17 LAB — CBC WITH DIFFERENTIAL/PLATELET
Basophils Absolute: 0 10*3/uL (ref 0.0–0.1)
Basophils Relative: 0 %
EOS ABS: 0.7 10*3/uL (ref 0.0–0.7)
Eosinophils Relative: 4 %
HCT: 24.7 % — ABNORMAL LOW (ref 39.0–52.0)
Hemoglobin: 8.1 g/dL — ABNORMAL LOW (ref 13.0–17.0)
LYMPHS ABS: 2 10*3/uL (ref 0.7–4.0)
LYMPHS PCT: 11 %
MCH: 30.9 pg (ref 26.0–34.0)
MCHC: 32.8 g/dL (ref 30.0–36.0)
MCV: 94.3 fL (ref 78.0–100.0)
MONO ABS: 2.4 10*3/uL — AB (ref 0.1–1.0)
Monocytes Relative: 13 %
NEUTROS ABS: 13.4 10*3/uL — AB (ref 1.7–7.7)
NRBC: 14 /100{WBCs} — AB
Neutrophils Relative %: 72 %
PLATELETS: 687 10*3/uL — AB (ref 150–400)
RBC: 2.62 MIL/uL — ABNORMAL LOW (ref 4.22–5.81)
RDW: 24.6 % — AB (ref 11.5–15.5)
WBC: 18.5 10*3/uL — ABNORMAL HIGH (ref 4.0–10.5)

## 2015-04-17 MED ORDER — HEPARIN SOD (PORK) LOCK FLUSH 100 UNIT/ML IV SOLN
500.0000 [IU] | INTRAVENOUS | Status: AC | PRN
  Administered 2015-04-17: 500 [IU]
  Filled 2015-04-17: qty 5

## 2015-04-17 MED ORDER — HYDROMORPHONE HCL 2 MG/ML IJ SOLN
2.0000 mg | Freq: Once | INTRAMUSCULAR | Status: AC
  Administered 2015-04-17: 2 mg via INTRAVENOUS
  Filled 2015-04-17: qty 1

## 2015-04-17 MED ORDER — DEXTROSE-NACL 5-0.45 % IV SOLN
INTRAVENOUS | Status: DC
  Administered 2015-04-17: 10:00:00 via INTRAVENOUS

## 2015-04-17 MED ORDER — HYDROMORPHONE HCL 2 MG/ML IJ SOLN
1.5000 mg | Freq: Once | INTRAMUSCULAR | Status: AC
Start: 2015-04-17 — End: 2015-04-17
  Administered 2015-04-17: 1.5 mg via INTRAVENOUS
  Filled 2015-04-17: qty 1

## 2015-04-17 MED ORDER — KETOROLAC TROMETHAMINE 30 MG/ML IJ SOLN
30.0000 mg | Freq: Once | INTRAMUSCULAR | Status: AC
  Administered 2015-04-17: 30 mg via INTRAVENOUS
  Filled 2015-04-17: qty 1

## 2015-04-17 MED ORDER — HYDROMORPHONE HCL 2 MG/ML IJ SOLN
1.0000 mg | Freq: Once | INTRAMUSCULAR | Status: AC
  Administered 2015-04-17: 1 mg via INTRAVENOUS
  Filled 2015-04-17: qty 1

## 2015-04-17 MED ORDER — SODIUM CHLORIDE 0.9% FLUSH
10.0000 mL | INTRAVENOUS | Status: AC | PRN
  Administered 2015-04-17: 10 mL

## 2015-04-17 MED ORDER — HYDROMORPHONE HCL 2 MG/ML IJ SOLN: 2.0000 mg | Freq: Once | INTRAMUSCULAR | Status: DC

## 2015-04-17 NOTE — Discharge Summary (Signed)
[facility] Discharge Summary   Patient ID: [name] MRN: [id number] DOB/AGE: 02-Jul-1980 35 y.o.  Admit date: 04/17/2015 Discharge date: 04/17/2015  Primary Care Physician:  [name], MD  Admission Diagnoses:  Active Problems:   Sickle-cell disease with pain ([place])  Discharge Medications:    Medication List    TAKE these medications        aspirin 81 MG chewable tablet  Chew 1 tablet (81 mg total) by mouth daily.     cyclobenzaprine 5 MG tablet  Commonly known as:  FLEXERIL  Take 1 tablet (5 mg total) by mouth 3 (three) times daily as needed for muscle spasms.     enoxaparin 120 MG/0.8ML injection  Commonly known as:  LOVENOX  Inject 0.8 mLs (120 mg total) into the skin daily.     folic acid 1 MG tablet  Commonly known as:  FOLVITE  Take 1 tablet (1 mg total) by mouth every morning.     gabapentin 300 MG capsule  Commonly known as:  NEURONTIN  Take 1 capsule (300 mg total) by mouth 3 (three) times daily.     HYDROmorphone 4 MG tablet  Commonly known as:  DILAUDID  Take 1 tablet (4 mg total) by mouth every 4 (four) hours as needed for severe pain.     hydroxyurea 500 MG capsule  Commonly known as:  HYDREA  Take 1 capsule (500 mg total) by mouth daily. May take with food to minimize GI side effects.     lisinopril 10 MG tablet  Commonly known as:  PRINIVIL,ZESTRIL  Take 1 tablet (10 mg total) by mouth daily.     metoprolol succinate 50 MG 24 hr tablet  Commonly known as:  TOPROL-XL  Take 1 tablet (50 mg total) by mouth daily.     morphine 30 MG 12 hr tablet  Commonly known as:  MS CONTIN  Take 1 tablet (30 mg total) by mouth every 12 (twelve) hours.     potassium chloride SA 20 MEQ tablet  Commonly known as:  K-DUR,KLOR-CON  Take 1 tablet (20 mEq total) by mouth every morning.     Vitamin D 2000 units tablet  Take 1 tablet (2,000 Units total) by mouth daily.     zolpidem 10 MG tablet  Commonly known as:  AMBIEN   Take 1 tablet (10 mg total) by mouth at bedtime as needed for sleep.     zolpidem 10 MG tablet  Commonly known as:  AMBIEN  Take 1 tablet (10 mg total) by mouth at bedtime as needed for sleep.         Consults:  None  Significant Diagnostic Studies:  Dg Chest 2 View  03/19/2015  CLINICAL DATA:  Sickle cell crisis EXAM: CHEST  2 VIEW COMPARISON:  03/04/2015 FINDINGS: There is mild bilateral interstitial thickening. There is no focal parenchymal opacity. There is no pleural effusion or pneumothorax. There is stable cardiomegaly. There is a left-sided Port-A-Cath in unchanged position. The osseous structures are unremarkable. IMPRESSION: No acute cardiopulmonary disease. Cardiomegaly with mild pulmonary vascular congestion. Electronically Signed   By: Kathreen Devoid   On: 03/19/2015 09:44     [facility] Course: Mr. [name] was admitted to the [facility] for extended observation.  Current hemoglobin is 8.1, patient was transfused on 04/14/2015  Patient was started on dilaudid per weight based rapid re-dosing.  Patient was having 9/10 pain on admission, intensity reduced to  6-7/10 Patient is alert, oriented, and ambulating. He states that he can manage at home on current medication regimen.  Patient has an appointment scheduled for 04/27/2015, he expressed understanding.     Physical Exam at Discharge:    BP 111/66 mmHg  Pulse 83  Temp(Src) 98.8 F (37.1 C) (Oral)  Resp 20  Ht 6' (1.829 m)  Wt 170 lb (77.111 kg)  BMI 23.05 kg/m2  SpO2 98%  General Appearance:    Alert, cooperative, no distress, appears stated age  Head:    Normocephalic, without obvious abnormality, atraumatic     Lungs:     Clear to auscultation bilaterally, respirations unlabored  Chest wall:    No tenderness or deformity  Heart:    Irregular rate and rhythm, S1 and S2 normal, Murmur, rub   or gallop  Abdomen:     Soft, non-tender, bowel sounds active all four  quadrants,    no masses, no organomegaly  Extremities:   Extremities normal, atraumatic, no cyanosis or edema  Pulses:   2+ and symmetric all extremities  Skin:   Skin color, texture, turgor normal, no rashes or lesions   Disposition at Discharge: 01-Home or Self Care  Discharge Orders: Discharge Instructions    Discharge patient    Complete by:  As directed            Condition at Discharge:   Stable  Time spent on Discharge:  15 minutes  Signed: Dajohn Ellender M 04/17/2015, 12:30 PM

## 2015-04-17 NOTE — Progress Notes (Signed)
Pt [facility] for [facility]. Pt was treated with IV fluids and IVP Dilaudid and Toradol. Pt's pain went from 8/10 to 7/10 at discharge. Stated that he felt better. Reminded him to cont to take his pain meds as prescribed. Pt voiced understanding. Porta cath was flushed per protocol at discharge. Pt was alert, oriented and ambulatory at discharge.Marland Kitchen

## 2015-04-17 NOTE — Telephone Encounter (Signed)
Called patient back and informed patient that L. Hollis NP wants him to wait longer for his pain medication to work since he just took it at 7 am, hydrate and continue taking medications as prescribed. Patient voiced understanding.

## 2015-04-17 NOTE — Telephone Encounter (Signed)
Patient called back to the [facility] with complaints of continued pain and asking to come to the [facility]. Lorel Monaco NP notified. Informed patient that he can come to the [facility] for treatment.

## 2015-04-17 NOTE — Telephone Encounter (Signed)
Called [facility] complaining of 10/10 bilateral "side" pain. Denies fever, chest pain, N/V, diarrhea or abdominal pain or priapism. Took Dilaudid 4 mg and MS Contin 30 mg at 7 am. Informed patient that I will let the provider know about his symptoms and call him back.

## 2015-04-17 NOTE — Discharge Instructions (Signed)
Sickle Cell Anemia, Adult Sickle cell anemia is a condition in which red blood cells have an abnormal "sickle" shape. This abnormal shape shortens the cells' life span, which results in a lower than normal concentration of red blood cells in the blood. The sickle shape also causes the cells to clump together and block free blood flow through the blood vessels. As a result, the tissues and organs of the body do not receive enough oxygen. Sickle cell anemia causes organ damage and pain and increases the risk of infection. CAUSES  Sickle cell anemia is a genetic disorder. Those who receive two copies of the gene have the condition, and those who receive one copy have the trait. RISK FACTORS The sickle cell gene is most common in people whose families originated in [place]. Other areas of the globe where sickle cell trait occurs include the [place], [place] and [place], the [place], and the [place].  SIGNS AND SYMPTOMS  Pain, especially in the extremities, back, chest, or abdomen (common). The pain may start suddenly or may develop following an illness, especially if there is dehydration. Pain can also occur due to overexertion or exposure to extreme temperature changes.  Frequent severe bacterial infections, especially certain types of pneumonia and meningitis.  Pain and swelling in the hands and feet.  Decreased activity.   Loss of appetite.   Change in behavior.  Headaches.  Seizures.  Shortness of breath or difficulty breathing.  Vision changes.  Skin ulcers. Those with the trait may not have symptoms or they may have mild symptoms.  DIAGNOSIS  Sickle cell anemia is diagnosed with blood tests that demonstrate the genetic trait. It is often diagnosed during the newborn period, due to mandatory testing nationwide. A variety of blood tests, X-rays, CT scans, MRI scans, ultrasounds, and lung function tests may also be done to monitor the condition. TREATMENT  Sickle  cell anemia may be treated with:  Medicines. You may be given pain medicines, antibiotic medicines (to treat and prevent infections) or medicines to increase the production of certain types of hemoglobin.  Fluids.  Oxygen.  Blood transfusions. HOME CARE INSTRUCTIONS   Drink enough fluid to keep your urine clear or pale yellow. Increase your fluid intake in hot weather and during exercise.  Do not smoke. Smoking lowers oxygen levels in the blood.   Only take over-the-counter or prescription medicines for pain, fever, or discomfort as directed by your health care provider.  Take antibiotics as directed by your health care provider. Make sure you finish them it even if you start to feel better.   Take supplements as directed by your health care provider.   Consider wearing a medical alert bracelet. This tells anyone caring for you in an emergency of your condition.   When traveling, keep your medical information, health care provider's names, and the medicines you take with you at all times.   If you develop a fever, do not take medicines to reduce the fever right away. This could cover up a problem that is developing. Notify your health care provider.  Keep all follow-up appointments with your health care provider. Sickle cell anemia requires regular medical care. SEEK MEDICAL CARE IF: You have a fever. SEEK IMMEDIATE MEDICAL CARE IF:   You feel dizzy or faint.   You have new abdominal pain, especially on the left side near the stomach area.   You develop a persistent, often uncomfortable and painful penile erection (priapism). If this is not treated immediately it   will lead to impotence.   You have numbness your arms or legs or you have a hard time moving them.   You have a hard time with speech.   You have a fever or persistent symptoms for more than 2-3 days.   You have a fever and your symptoms suddenly get worse.   You have signs or symptoms of infection.  These include:   Chills.   Abnormal tiredness (lethargy).   Irritability.   Poor eating.   Vomiting.   You develop pain that is not helped with medicine.   You develop shortness of breath.  You have pain in your chest.   You are coughing up pus-like or bloody sputum.   You develop a stiff neck.  Your feet or hands swell or have pain.  Your abdomen appears bloated.  You develop joint pain. MAKE SURE YOU:  Understand these instructions.   This information is not intended to replace advice given to you by your health care provider. Make sure you discuss any questions you have with your health care provider.   Document Released: 04/27/2005 Document Revised: 02/07/2014 Document Reviewed: 08/29/2012 Elsevier Interactive Patient Education 2016 [employer]  

## 2015-04-17 NOTE — H&P (Signed)
[name] History and Physical   Date: 04/17/2015  Patient name: [name] Medical record number: [id number] Date of birth: 1980-06-23 Age: 35 y.o. Gender: male PCP: Gerald Chessman, MD  Attending physician: Gerald Garter, MD  Chief Complaint: Left flank pain  History of Present Illness: [name], a 35 year old male with a history of sickle cell anemia, HbSS presents complaining of right and left flank pain that is consistent with sickle cell anemia. He maintains that pain started several days ago and has not been controlled on home medication regimen since that time. He attributes current pain crisis to changes in weather and stress.  He was last evaluated in the day infusion center on 04/14/2015.  Current pain intensity is 8/10 described as constant and throbbing. He last had Dilaudid 4 mg around 7 am with minimal relief. He is taking all other medications consistently. He denies headache, chest pains, fatigue, dysuria, nausea, vomiting, or diarrhea.  Meds: Prescriptions prior to admission  Medication Sig Dispense Refill Last Dose  . aspirin 81 MG chewable tablet Chew 1 tablet (81 mg total) by mouth daily. 30 tablet 11 04/12/2015 at Unknown time  . Cholecalciferol (VITAMIN D) 2000 units tablet Take 1 tablet (2,000 Units total) by mouth daily. 90 tablet 3 04/12/2015 at Unknown time  . cyclobenzaprine (FLEXERIL) 5 MG tablet Take 1 tablet (5 mg total) by mouth 3 (three) times daily as needed for muscle spasms. (Patient not taking: Reported on 03/06/2015) 20 tablet 0 More than a month at Unknown time  . enoxaparin (LOVENOX) 120 MG/0.8ML injection Inject 0.8 mLs (120 mg total) into the skin daily. 30 Syringe 2 04/12/2015 at Unknown time  . folic acid (FOLVITE) 1 MG tablet Take 1 tablet (1 mg total) by mouth every morning. 30 tablet 11 04/12/2015 at Unknown time  . gabapentin (NEURONTIN) 300 MG capsule Take 1 capsule (300 mg total) by mouth 3 (three)  times daily. 30 capsule 0 04/12/2015 at Unknown time  . HYDROmorphone (DILAUDID) 4 MG tablet Take 1 tablet (4 mg total) by mouth every 4 (four) hours as needed for severe pain. 90 tablet 0 04/13/2015 at 0700  . hydroxyurea (HYDREA) 500 MG capsule Take 1 capsule (500 mg total) by mouth daily. May take with food to minimize GI side effects. 30 capsule 0 04/12/2015 at Unknown time  . lisinopril (PRINIVIL,ZESTRIL) 10 MG tablet Take 1 tablet (10 mg total) by mouth daily. 30 tablet 5   . metoprolol succinate (TOPROL-XL) 50 MG 24 hr tablet Take 1 tablet (50 mg total) by mouth daily. 30 tablet 3 04/12/2015 at Unknown time  . morphine (MS CONTIN) 30 MG 12 hr tablet Take 1 tablet (30 mg total) by mouth every 12 (twelve) hours. 60 tablet 0 04/13/2015 at 0700  . potassium chloride SA (K-DUR,KLOR-CON) 20 MEQ tablet Take 1 tablet (20 mEq total) by mouth every morning. 30 tablet 3   . zolpidem (AMBIEN) 10 MG tablet Take 1 tablet (10 mg total) by mouth at bedtime as needed for sleep. 30 tablet 0 Past Month at Unknown time  . zolpidem (AMBIEN) 10 MG tablet Take 1 tablet (10 mg total) by mouth at bedtime as needed for sleep. 30 tablet 0     Allergies: Review of patient's allergies indicates no known allergies. Past Medical History  Diagnosis Date  . Sickle cell anemia (HCC)   . Blood transfusion   . Acute embolism and thrombosis of right internal jugular  vein ([place])   . Hypokalemia   . Mood disorder ([place])   . History of pulmonary embolus ([facility])   . Avascular necrosis (HCC)     Right Hip  . Leukocytosis     Chronic  . Thrombocytosis (HCC)     Chronic  . Hypertension   . History of Clostridium difficile infection   . Uses marijuana   . Chronic anticoagulation   . Functional asplenia   . Former smoker   . Second hand tobacco smoke exposure   . Alcohol consumption of one to four drinks per day   . Noncompliance with medication regimen   . Sickle-cell crisis with associated acute chest syndrome ([place]) 05/13/2013   . Acute chest syndrome ([place]) 06/18/2013  . Demand ischemia ([place]) 01/02/2014  . Pulmonary hypertension [facility])    Past Surgical History  Procedure Laterality Date  . Right hip replacement      08/2006  . Cholecystectomy      01/2008  . Porta cath placement    . Porta cath removal    . Umbilical hernia repair      01/2008  . Excision of left periauricular cyst      10/2009  . Excision of right ear lobe cyst with primary closur      11/2007  . Portacath placement  01/05/2012    Procedure: INSERTION PORT-A-CATH;  Surgeon: Gerald Hollingshead, MD;  Location: [place];  Service: General;  Laterality: N/A;  ultrasound guiced port a cath insertion with fluoroscopy   Family History  Problem Relation Age of Onset  . Sickle cell trait Mother   . Depression Mother   . Diabetes Mother   . Sickle cell trait Father   . Sickle cell trait Brother    Social History   Social History  . Marital Status: Single    Spouse Name: N/A  . Number of Children: 0  . Years of Education: 13   Occupational History  . 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 Smoking status: Former Smoker -- 13 years  . Smokeless tobacco: Never Used  . Alcohol Use: No  . Drug Use: 2.00 per week    Special: Marijuana     Comment: Marijuana weekly  . Sexual Activity: Not on file     Comment: month ago   Other Topics Concern  . Not on file   Social History Narrative   Lives in an apartment.  Single.  Lives alone but has a girlfriend that helps care for him.  Does not use any assist devices.        Gerald [id number]  [phone] Mom, emergency contact      [facility]:   Patient continuing to live in her apartment in town alone. 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    Review of Systems: Eyes: positive for icterus Ears, nose, mouth, throat, and face: negative Respiratory: positive for dyspnea on exertion, negative for emphysema, sputum and  wheezing Cardiovascular: negative Gastrointestinal: negative for abdominal pain, constipation and dysphagia Genitourinary:positive for dysuria and frequency Integument/breast: negative Hematologic/lymphatic: negative Musculoskeletal:positive for bone pain and myalgias Neurological: negative Behavioral/Psych: negative Endocrine: negative  Physical Exam: There were no vitals taken for this visit.   General Appearance:    Alert, cooperative, mild distress, appears stated age  Head:    Normocephalic, without obvious abnormality, atraumatic  Eyes:    PERRL, conjunctiva/corneas clear, EOM's intact,  fundi    benign, scleral icterus      Ears:    Normal TM's and external ear canals, both ears  Nose:   Nares normal, septum midline, mucosa normal, no drainage    or sinus tenderness  Throat:   Lips, mucosa, and tongue normal; teeth and gums normal  Neck:   Supple, symmetrical, trachea midline, no adenopathy;       thyroid:  No enlargement/tenderness/nodules; no carotid   bruit or JVD  Back:     Symmetric, no curvature, ROM normal, no CVA tenderness  Lungs:     Clear to auscultation bilaterally, respirations unlabored  Chest wall:    No tenderness or deformity  Heart:    Regular rate and rhythm, S1 and S2 normal, Murmur, rub   or gallop  Extremities:   Extremities normal, atraumatic, no cyanosis or edema  Pulses:   2+ and symmetric all extremities  Skin:   Skin color, texture, turgor normal, no rashes or lesions  Lymph nodes:   Cervical, supraclavicular, and axillary nodes normal  Neurologic:   CNII-XII intact. Normal strength, sensation and reflexes      throughout    Lab results: No results found. However, due to the size of the patient record, not all encounters were searched. Please check Results Review for a complete set of results.  Imaging results:  No results found. Assessment & Plan:  Patient will be admitted to the day infusion center for extended observation  Start IV  D5.45 for cellular rehydration at 150/hr  Start Toradol 30 mg IV times 25for inflammation.  Start Dilaudid per weight base rapid re-dosing protocol  Patient will be re-evaluated for pain intensity in the context of function and relationship to                  baseline as care progresses.  If no significant pain relief, will transfer patient to inpatient services for a higher level of care.   Reviewed labs from 04/14/2015, consistent with baseline  Start oxygen via n/c at 2 liters per home dose  Winslow Ederer M 04/17/2015, 9:39 AM

## 2015-04-22 IMAGING — CR DG CHEST 2V
2 series · 2 of 2 positions shown · non-contrast
Comparison: 05/16/2012

CLINICAL DATA: Chest pain.  Shortness of breath.  History of sickle
cell and previous pulmonary embolus.

CHEST - 2 VIEW

[w chest pa]
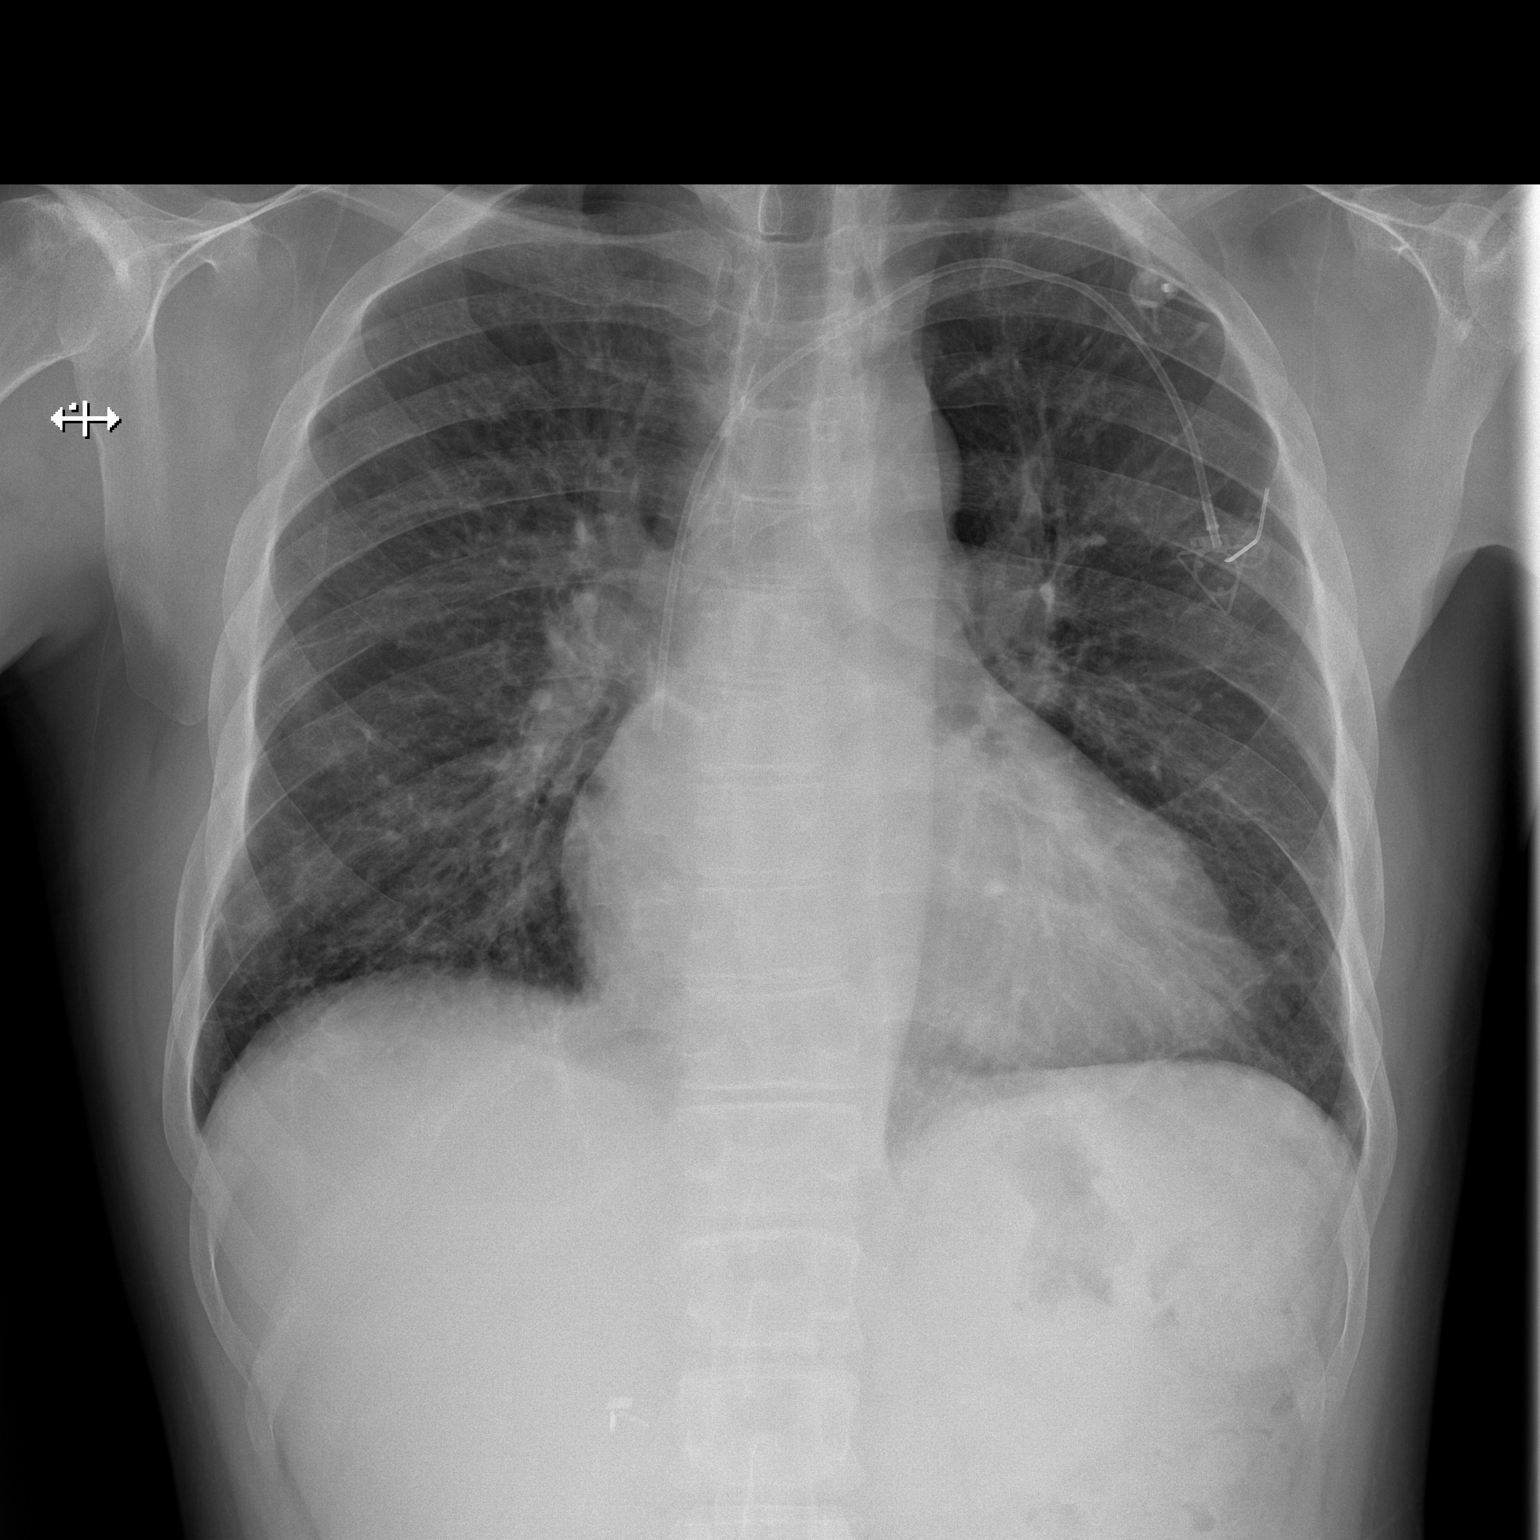

[w chest lat]
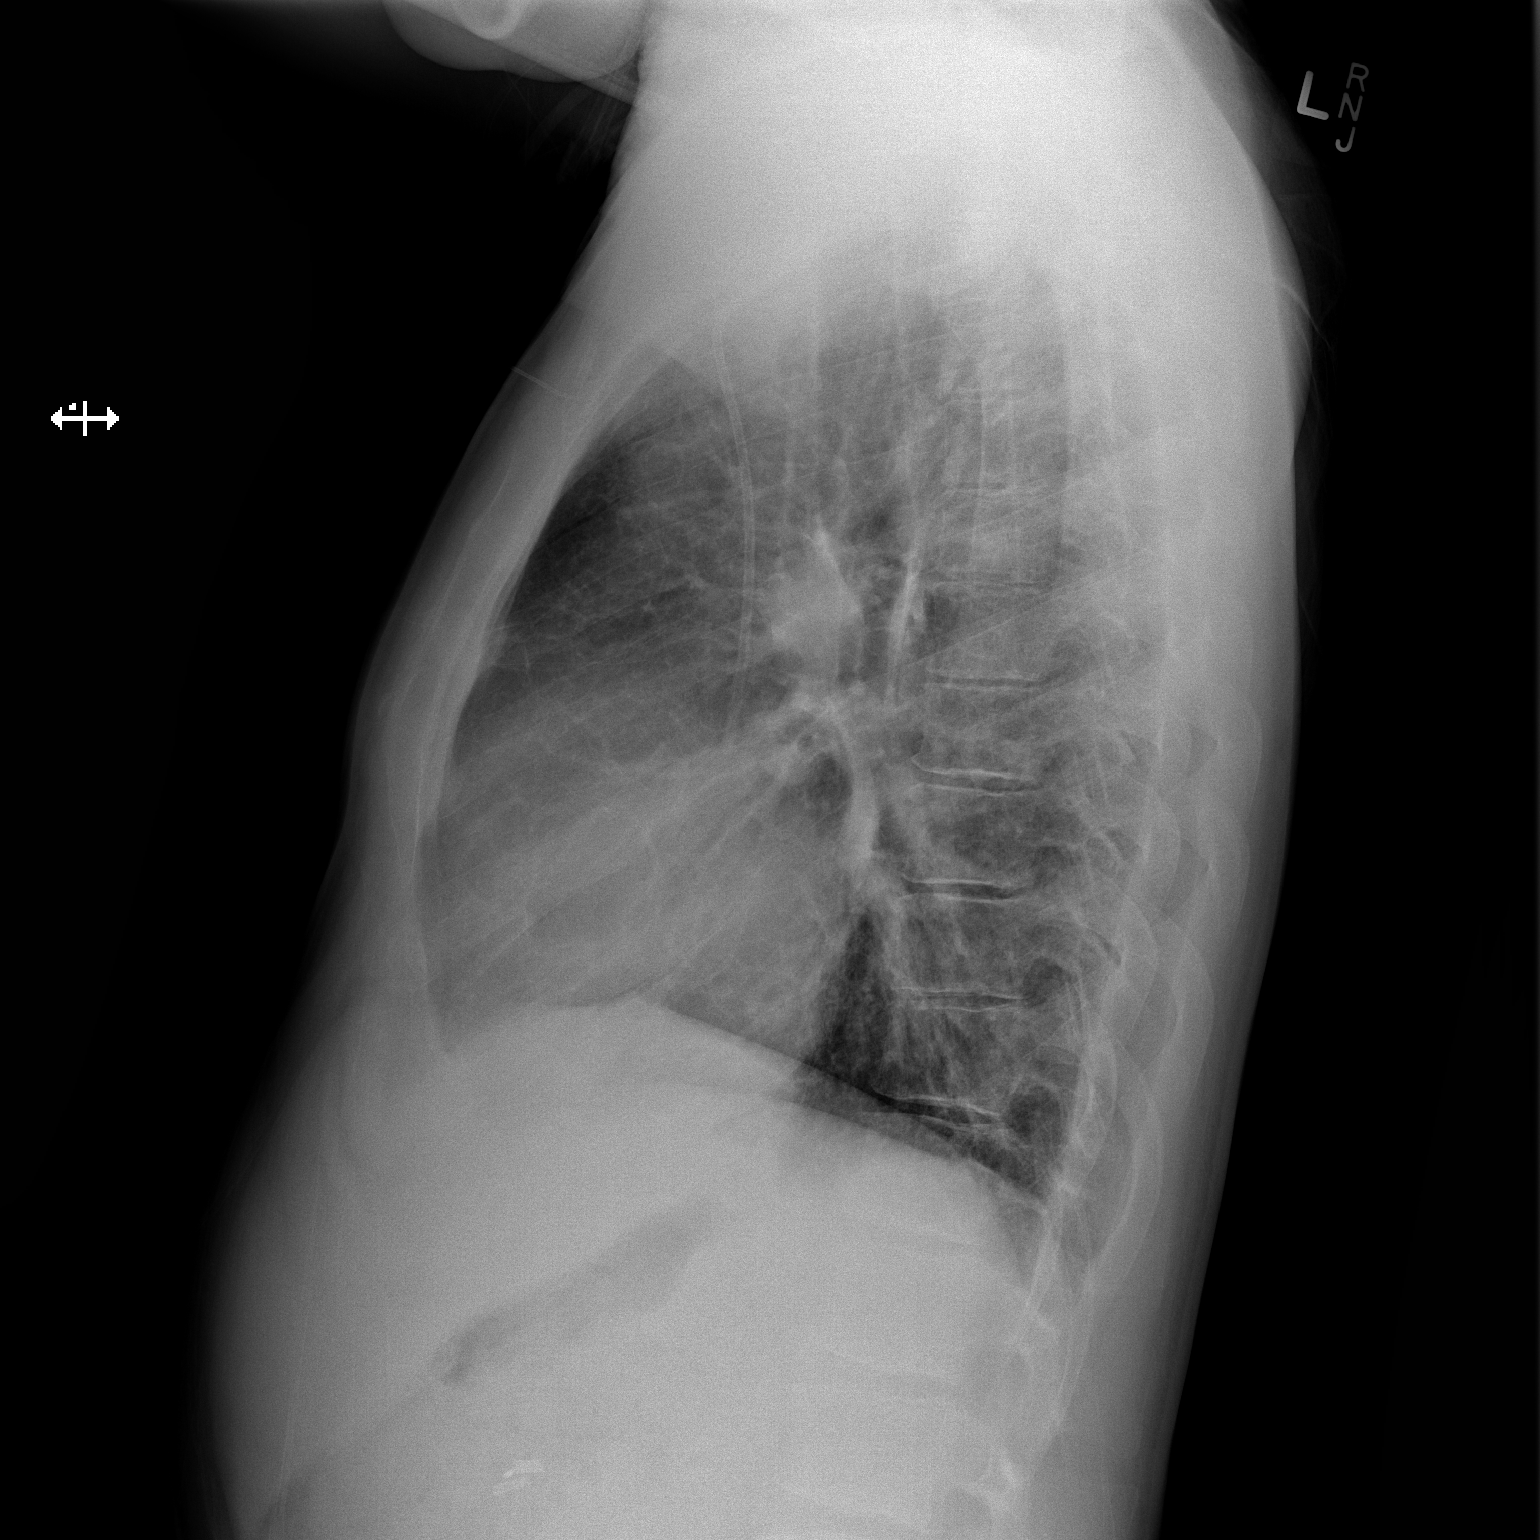

[2 of 2 positions shown; findings below may reference images not displayed]

FINDINGS: Stable appearance of power port type central venous
catheter.  Mild cardiac enlargement with normal pulmonary
vascularity.  Peribronchial thickening and central interstitial
changes suggesting chronic bronchitis and fibrosis.  No focal
airspace consolidation or edema.  No blunting of costophrenic
angles.  No pneumothorax.  Mediastinal contours appear intact.
Minimal endplate changes in the spine consistent with history of
sickle cell.  Surgical clips in the right upper quadrant.  No
significant change since previous study.
IMPRESSION: No evidence of active pulmonary disease.  Mild cardiac enlargement
with chronic bronchitic changes and mild interstitial fibrosis.

## 2015-04-24 ENCOUNTER — Emergency Department (HOSPITAL_COMMUNITY)
Admission: EM | Admit: 2015-04-24 | Discharge: 2015-04-24 | Disposition: A | Discharge status: Home / Self Care | Payer: Medicare Other | Source: Home / Self Care | Attending: Emergency Medicine | Admitting: Emergency Medicine

## 2015-04-24 ENCOUNTER — Encounter (HOSPITAL_COMMUNITY): Payer: Self-pay | Admitting: Emergency Medicine

## 2015-04-24 ENCOUNTER — Emergency Department (HOSPITAL_COMMUNITY): Payer: Medicare Other

## 2015-04-24 DIAGNOSIS — E869 Volume depletion, unspecified: Secondary | ICD-10-CM | POA: Diagnosis present

## 2015-04-24 DIAGNOSIS — Z7901 Long term (current) use of anticoagulants: Secondary | ICD-10-CM | POA: Insufficient documentation

## 2015-04-24 DIAGNOSIS — Z86711 Personal history of pulmonary embolism: Secondary | ICD-10-CM

## 2015-04-24 DIAGNOSIS — N179 Acute kidney failure, unspecified: Secondary | ICD-10-CM | POA: Diagnosis not present

## 2015-04-24 DIAGNOSIS — Z7982 Long term (current) use of aspirin: Secondary | ICD-10-CM | POA: Insufficient documentation

## 2015-04-24 DIAGNOSIS — Z86718 Personal history of other venous thrombosis and embolism: Secondary | ICD-10-CM

## 2015-04-24 DIAGNOSIS — I10 Essential (primary) hypertension: Secondary | ICD-10-CM | POA: Diagnosis present

## 2015-04-24 DIAGNOSIS — D5700 Hb-SS disease with crisis, unspecified: Principal | ICD-10-CM | POA: Diagnosis present

## 2015-04-24 DIAGNOSIS — Z79899 Other long term (current) drug therapy: Secondary | ICD-10-CM

## 2015-04-24 DIAGNOSIS — Z8739 Personal history of other diseases of the musculoskeletal system and connective tissue: Secondary | ICD-10-CM | POA: Insufficient documentation

## 2015-04-24 DIAGNOSIS — I272 Other secondary pulmonary hypertension: Secondary | ICD-10-CM | POA: Diagnosis not present

## 2015-04-24 DIAGNOSIS — Z8619 Personal history of other infectious and parasitic diseases: Secondary | ICD-10-CM | POA: Insufficient documentation

## 2015-04-24 DIAGNOSIS — Z87891 Personal history of nicotine dependence: Secondary | ICD-10-CM | POA: Insufficient documentation

## 2015-04-24 DIAGNOSIS — D638 Anemia in other chronic diseases classified elsewhere: Secondary | ICD-10-CM | POA: Diagnosis present

## 2015-04-24 DIAGNOSIS — Z9119 Patient's noncompliance with other medical treatment and regimen: Secondary | ICD-10-CM | POA: Insufficient documentation

## 2015-04-24 DIAGNOSIS — M79606 Pain in leg, unspecified: Secondary | ICD-10-CM | POA: Diagnosis present

## 2015-04-24 DIAGNOSIS — Z79891 Long term (current) use of opiate analgesic: Secondary | ICD-10-CM

## 2015-04-24 DIAGNOSIS — J9611 Chronic respiratory failure with hypoxia: Secondary | ICD-10-CM | POA: Diagnosis present

## 2015-04-24 DIAGNOSIS — E875 Hyperkalemia: Secondary | ICD-10-CM | POA: Diagnosis present

## 2015-04-24 DIAGNOSIS — D72829 Elevated white blood cell count, unspecified: Secondary | ICD-10-CM | POA: Diagnosis present

## 2015-04-24 DIAGNOSIS — L989 Disorder of the skin and subcutaneous tissue, unspecified: Secondary | ICD-10-CM | POA: Diagnosis not present

## 2015-04-24 DIAGNOSIS — Q8901 Asplenia (congenital): Secondary | ICD-10-CM | POA: Insufficient documentation

## 2015-04-24 DIAGNOSIS — R079 Chest pain, unspecified: Secondary | ICD-10-CM | POA: Diagnosis not present

## 2015-04-24 LAB — CBC WITH DIFFERENTIAL/PLATELET
BASOS ABS: 0 10*3/uL (ref 0.0–0.1)
Basophils Relative: 0 %
EOS ABS: 0.2 10*3/uL (ref 0.0–0.7)
Eosinophils Relative: 1 %
HCT: 22.9 % — ABNORMAL LOW (ref 39.0–52.0)
Hemoglobin: 7.8 g/dL — ABNORMAL LOW (ref 13.0–17.0)
LYMPHS ABS: 1.6 10*3/uL (ref 0.7–4.0)
Lymphocytes Relative: 9 %
MCH: 29.9 pg (ref 26.0–34.0)
MCHC: 34.1 g/dL (ref 30.0–36.0)
MCV: 87.7 fL (ref 78.0–100.0)
MONO ABS: 1.9 10*3/uL — AB (ref 0.1–1.0)
Monocytes Relative: 11 %
NEUTROS ABS: 13.7 10*3/uL — AB (ref 1.7–7.7)
Neutrophils Relative %: 79 %
Platelets: 602 10*3/uL — ABNORMAL HIGH (ref 150–400)
RBC: 2.61 MIL/uL — ABNORMAL LOW (ref 4.22–5.81)
RDW: 22.1 % — AB (ref 11.5–15.5)
WBC: 17.4 10*3/uL — ABNORMAL HIGH (ref 4.0–10.5)

## 2015-04-24 LAB — COMPREHENSIVE METABOLIC PANEL
ALBUMIN: 4.3 g/dL (ref 3.5–5.0)
ALT: 30 U/L (ref 17–63)
ANION GAP: 9 (ref 5–15)
AST: 36 U/L (ref 15–41)
Alkaline Phosphatase: 91 U/L (ref 38–126)
BUN: 13 mg/dL (ref 6–20)
CO2: 22 mmol/L (ref 22–32)
Calcium: 9.5 mg/dL (ref 8.9–10.3)
Chloride: 110 mmol/L (ref 101–111)
Creatinine, Ser: 0.66 mg/dL (ref 0.61–1.24)
GFR calc Af Amer: >60 mL/min (ref >60)
GFR calc non Af Amer: >60 mL/min (ref >60)
GLUCOSE: 96 mg/dL (ref 65–99)
POTASSIUM: 4.6 mmol/L (ref 3.5–5.1)
SODIUM: 141 mmol/L (ref 135–145)
TOTAL PROTEIN: 8.2 g/dL — AB (ref 6.5–8.1)
Total Bilirubin: 4.2 mg/dL — ABNORMAL HIGH (ref 0.3–1.2)

## 2015-04-24 LAB — RETICULOCYTES
RBC.: 2.61 MIL/uL — AB (ref 4.22–5.81)
RETIC COUNT ABSOLUTE: 336.7 10*3/uL — AB (ref 19.0–186.0)
Retic Ct Pct: 12.9 % — ABNORMAL HIGH (ref 0.4–3.1)

## 2015-04-24 MED ORDER — KETOROLAC TROMETHAMINE 30 MG/ML IJ SOLN
30.0000 mg | Freq: Once | INTRAMUSCULAR | Status: AC
  Administered 2015-04-24: 30 mg via INTRAVENOUS
  Filled 2015-04-24: qty 1

## 2015-04-24 MED ORDER — HEPARIN SOD (PORK) LOCK FLUSH 100 UNIT/ML IV SOLN
500.0000 [IU] | Freq: Once | INTRAVENOUS | Status: AC
  Administered 2015-04-24: 500 [IU]
  Filled 2015-04-24: qty 5

## 2015-04-24 MED ORDER — ONDANSETRON HCL 4 MG/2ML IJ SOLN
4.0000 mg | Freq: Once | INTRAMUSCULAR | Status: AC
  Administered 2015-04-24: 4 mg via INTRAVENOUS
  Filled 2015-04-24: qty 2

## 2015-04-24 MED ORDER — HYDROMORPHONE HCL 2 MG/ML IJ SOLN
2.0000 mg | INTRAMUSCULAR | Status: AC | PRN
  Administered 2015-04-24 (×3): 2 mg via INTRAVENOUS
  Filled 2015-04-24 (×3): qty 1

## 2015-04-24 NOTE — Discharge Instructions (Signed)
Please follow up with your primary care doctor. Continue your pain medications at home. Return if worsening.   Sickle Cell Anemia, Adult Sickle cell anemia is a condition in which red blood cells have an abnormal "sickle" shape. This abnormal shape shortens the cells' life span, which results in a lower than normal concentration of red blood cells in the blood. The sickle shape also causes the cells to clump together and block free blood flow through the blood vessels. As a result, the tissues and organs of the body do not receive enough oxygen. Sickle cell anemia causes organ damage and pain and increases the risk of infection. CAUSES  Sickle cell anemia is a genetic disorder. Those who receive two copies of the gene have the condition, and those who receive one copy have the trait. RISK FACTORS The sickle cell gene is most common in people whose families originated in [place]. Other areas of the globe where sickle cell trait occurs include the [place], [place] and [name], and the [place].  SIGNS AND SYMPTOMS  Pain, especially in the extremities, back, chest, or abdomen (common). The pain may start suddenly or may develop following an illness, especially if there is dehydration. Pain can also occur due to overexertion or exposure to extreme temperature changes.  Frequent severe bacterial infections, especially certain types of pneumonia and meningitis.  Pain and swelling in the hands and feet.  Decreased activity.   Loss of appetite.   Change in behavior.  Headaches.  Seizures.  Shortness of breath or difficulty breathing.  Vision changes.  Skin ulcers. Those with the trait may not have symptoms or they may have mild symptoms.  DIAGNOSIS  Sickle cell anemia is diagnosed with blood tests that demonstrate the genetic trait. It is often diagnosed during the newborn period, due to mandatory testing nationwide. A variety of blood tests, X-rays, CT scans,  MRI scans, ultrasounds, and lung function tests may also be done to monitor the condition. TREATMENT  Sickle cell anemia may be treated with:  Medicines. You may be given pain medicines, antibiotic medicines (to treat and prevent infections) or medicines to increase the production of certain types of hemoglobin.  Fluids.  Oxygen.  Blood transfusions. HOME CARE INSTRUCTIONS   Drink enough fluid to keep your urine clear or pale yellow. Increase your fluid intake in hot weather and during exercise.  Do not smoke. Smoking lowers oxygen levels in the blood.   Only take over-the-counter or prescription medicines for pain, fever, or discomfort as directed by your health care provider.  Take antibiotics as directed by your health care provider. Make sure you finish them it even if you start to feel better.   Take supplements as directed by your health care provider.   Consider wearing a medical alert bracelet. This tells anyone caring for you in an emergency of your condition.   When traveling, keep your medical information, health care provider's names, and the medicines you take with you at all times.   If you develop a fever, do not take medicines to reduce the fever right away. This could cover up a problem that is developing. Notify your health care provider.  Keep all follow-up appointments with your health care provider. Sickle cell anemia requires regular medical care. SEEK MEDICAL CARE IF: You have a fever. SEEK IMMEDIATE MEDICAL CARE IF:   You feel dizzy or faint.   You have new abdominal pain, especially on the left side near the stomach area.  You develop a persistent, often uncomfortable and painful penile erection (priapism). If this is not treated immediately it will lead to impotence.   You have numbness your arms or legs or you have a hard time moving them.   You have a hard time with speech.   You have a fever or persistent symptoms for more than 2-3  days.   You have a fever and your symptoms suddenly get worse.   You have signs or symptoms of infection. These include:   Chills.   Abnormal tiredness (lethargy).   Irritability.   Poor eating.   Vomiting.   You develop pain that is not helped with medicine.   You develop shortness of breath.  You have pain in your chest.   You are coughing up pus-like or bloody sputum.   You develop a stiff neck.  Your feet or hands swell or have pain.  Your abdomen appears bloated.  You develop joint pain. MAKE SURE YOU:  Understand these instructions.   This information is not intended to replace advice given to you by your health care provider. Make sure you discuss any questions you have with your health care provider.   Document Released: 04/27/2005 Document Revised: 02/07/2014 Document Reviewed: 08/29/2012 Elsevier Interactive Patient Education [employer].

## 2015-04-24 NOTE — Progress Notes (Signed)
34 yr old medicare/medicaid of [place] pt [facility] in [facility] 04/17/15 Treated with IV fluids and IVP Dilaudid and Toradol. Improved at d/c  Pt woith 16 [facility] ED visits and 26 admissions in the last 6 months [facility] providers are pcp for this pt  C/o sickle cell pain crisis in [facility] ED Treated with IVFs and pain medications and improved  Denies issues with getting pain medications and taking them at home or making and attending his pcp appointments

## 2015-04-24 NOTE — ED Notes (Signed)
Pt with Hx of sickle cell anemia c/o lateral low rib and back pain. No SOB, no chest pain. Feels similar to previous episodes.

## 2015-04-24 NOTE — ED Provider Notes (Signed)
CSN: [id number]     Arrival date & time 04/24/15  [id number] History   First MD Initiated Contact with Patient 04/24/15 [phone]     Chief Complaint  Patient presents with  . Sickle Cell Pain Crisis     (Consider location/radiation/quality/duration/timing/severity/associated sxs/prior Treatment) HPI [name] is a 35 y.o. male with sickle cell anemia, prior jugular vein embolism, hypertension, acute chest syndrome, presents to emergency department complaining of bilateral rib pain. Patient states this feels like typical sickle cell crisis for him. He denies any cough. He states he does not think he had fever but states he did sweat last night. He denies any shortness of breath. Denies any abdominal pain. No pain anywhere else in the body. Pain started last night. He states he took Dilaudid this morning for pain which did not help so decided to come to the ER. He states nothing is making his pain worse or better. It is not pleuritic.  Past Medical History  Diagnosis Date  . Sickle cell anemia (HCC)   . Blood transfusion   . Acute embolism and thrombosis of right internal jugular vein ([place])   . Hypokalemia   . Mood disorder ([place])   . History of pulmonary embolus (PE)   . Avascular necrosis (HCC)     Right Hip  . Leukocytosis     Chronic  . Thrombocytosis (HCC)     Chronic  . Hypertension   . History of Clostridium difficile infection   . Uses marijuana   . Chronic anticoagulation   . Functional asplenia   . Former smoker   . Second hand tobacco smoke exposure   . Alcohol consumption of one to four drinks per day   . Noncompliance with medication regimen   . Sickle-cell crisis with associated acute chest syndrome ([place]) 05/13/2013  . Acute chest syndrome ([place]) 06/18/2013  . Demand ischemia ([place]) 01/02/2014  . Pulmonary hypertension [facility])    Past Surgical History  Procedure Laterality Date  . Right hip replacement      08/2006  . Cholecystectomy      01/2008  . Porta cath  placement    . Porta cath removal    . Umbilical hernia repair      01/2008  . Excision of left periauricular cyst      10/2009  . Excision of right ear lobe cyst with primary closur      11/2007  . Portacath placement  01/05/2012    Procedure: INSERTION PORT-A-CATH;  Surgeon: Odis Hollingshead, MD;  Location: [place];  Service: General;  Laterality: N/A;  ultrasound guiced port a cath insertion with fluoroscopy   Family History  Problem Relation Age of Onset  . Sickle cell trait Mother   . Depression Mother   . Diabetes Mother   . Sickle cell trait Father   . Sickle cell trait Brother    Social History  Substance Use Topics  . Smoking status: Former Smoker -- 13 years  . Smokeless tobacco: Never Used  . Alcohol Use: No    Review of Systems  Constitutional: Negative for fever and chills.  Respiratory: Negative for cough, chest tightness and shortness of breath.   Cardiovascular: Negative for chest pain, palpitations and leg swelling.  Gastrointestinal: Negative for nausea, vomiting, abdominal pain, diarrhea and abdominal distention.  Genitourinary: Negative for dysuria, urgency, frequency and hematuria.  Musculoskeletal: Positive for myalgias and arthralgias. Negative for neck pain and neck stiffness.  Skin: Negative for rash.  Allergic/Immunologic: Negative  for immunocompromised state.  Neurological: Negative for dizziness, weakness, light-headedness, numbness and headaches.  All other systems reviewed and are negative.     Allergies  Review of patient's allergies indicates no known allergies.  Home Medications   Prior to Admission medications   Medication Sig Start Date End Date Taking? Authorizing Provider  aspirin 81 MG chewable tablet Chew 1 tablet (81 mg total) by mouth daily. 07/23/14   [name], MD  Cholecalciferol (VITAMIN D) 2000 units tablet Take 1 tablet (2,000 Units total) by mouth daily. 02/23/15   Dorena Dew, FNP  cyclobenzaprine (FLEXERIL)  5 MG tablet Take 1 tablet (5 mg total) by mouth 3 (three) times daily as needed for muscle spasms. Patient not taking: Reported on 03/06/2015 02/19/15   Alfonzo Beers, MD  enoxaparin (LOVENOX) 120 MG/0.8ML injection Inject 0.8 mLs (120 mg total) into the skin daily. 12/03/14   [name], MD  folic acid (FOLVITE) 1 MG tablet Take 1 tablet (1 mg total) by mouth every morning. 02/23/15   Dorena Dew, FNP  gabapentin (NEURONTIN) 300 MG capsule Take 1 capsule (300 mg total) by mouth 3 (three) times daily. 04/08/15   [name], MD  HYDROmorphone (DILAUDID) 4 MG tablet Take 1 tablet (4 mg total) by mouth every 4 (four) hours as needed for severe pain. 04/06/15   Dorena Dew, FNP  hydroxyurea (HYDREA) 500 MG capsule Take 1 capsule (500 mg total) by mouth daily. May take with food to minimize GI side effects. 11/06/14   Dorena Dew, FNP  lisinopril (PRINIVIL,ZESTRIL) 10 MG tablet Take 1 tablet (10 mg total) by mouth daily. 04/13/15   Dorena Dew, FNP  metoprolol succinate (TOPROL-XL) 50 MG 24 hr tablet Take 1 tablet (50 mg total) by mouth daily. 02/23/15   Dorena Dew, FNP  morphine (MS CONTIN) 30 MG 12 hr tablet Take 1 tablet (30 mg total) by mouth every 12 (twelve) hours. 04/06/15   Dorena Dew, FNP  potassium chloride SA (K-DUR,KLOR-CON) 20 MEQ tablet Take 1 tablet (20 mEq total) by mouth every morning. 04/13/15   Dorena Dew, FNP  zolpidem (AMBIEN) 10 MG tablet Take 1 tablet (10 mg total) by mouth at bedtime as needed for sleep. 11/29/11 04/09/16  Luvenia Heller, FNP  zolpidem (AMBIEN) 10 MG tablet Take 1 tablet (10 mg total) by mouth at bedtime as needed for sleep. 04/10/15   Dorena Dew, FNP   BP 106/73 mmHg  Pulse 73  Temp(Src) 98.2 F (36.8 C) (Oral)  Resp 19  SpO2 93% Physical Exam  Constitutional: He appears well-developed and well-nourished. No distress.  HENT:  Head: Normocephalic and atraumatic.  Eyes: Conjunctivae are normal.  Neck: Neck supple.   Cardiovascular: Normal rate, regular rhythm and normal heart sounds.   Pulmonary/Chest: Effort normal. No respiratory distress. He has no wheezes. He has no rales. He exhibits no tenderness.  Abdominal: Soft. Bowel sounds are normal. He exhibits no distension. There is no tenderness. There is no rebound.  Musculoskeletal: He exhibits no edema.  Neurological: He is alert.  Skin: Skin is warm and dry.  Nursing note and vitals reviewed.   ED Course  Procedures (including critical care time) Labs Review Labs Reviewed  CBC WITH DIFFERENTIAL/PLATELET - Abnormal; Notable for the following:    WBC 17.4 (*)    RBC 2.61 (*)    Hemoglobin 7.8 (*)    HCT 22.9 (*)    RDW 22.1 (*)  Platelets 602 (*)    Neutro Abs 13.7 (*)    Monocytes Absolute 1.9 (*)    All other components within normal limits  COMPREHENSIVE METABOLIC PANEL - Abnormal; Notable for the following:    Total Protein 8.2 (*)    Total Bilirubin 4.2 (*)    All other components within normal limits  RETICULOCYTES - Abnormal; Notable for the following:    Retic Ct Pct 12.9 (*)    RBC. 2.61 (*)    Retic Count, Manual 336.7 (*)    All other components within normal limits    Imaging Review Dg Chest 2 View  04/24/2015  CLINICAL DATA:  Sickle cell crisis with chest pain, initial encounter EXAM: CHEST  2 VIEW COMPARISON:  03/19/2015 FINDINGS: Cardiac shadow is enlarged. A left chest wall port is again seen and stable. The lungs are well aerated bilaterally. No acute bony abnormality is seen. IMPRESSION: No acute abnormality noted. Electronically Signed   By: Inez Catalina M.D.   On: 04/24/2015 09:59   I have personally reviewed and evaluated these images and lab results as part of my medical decision-making.   EKG Interpretation None      MDM   Final diagnoses:  Sickle cell pain crisis ([place])    Pt with bilateral rib pain. No cough, SOB, fever, no peluritic pain. Will get labs, CXR. Will monitor. Medications ordered.    10:42 AM Spoke with sickle cell clinic, they are unable to take him due to no staffing. Will continue treatment in ED.  12:52 PM Patient's retake count is 12.9. Hemoglobin 7.8. Appears to be close to baseline. His blood cell count 17.4. He is not febrile, denies any urinary symptoms, denies any other signs of infection. He was treated in emergency department with IV fluids, pain medications. He received 3 doses of IV dilaudid 2mg  and 30mg  of toradol. He feels much better. He wants to be discharged home. His chest x-ray is normal. He will follow up with primary care doctor.  Filed Vitals:   04/24/15 0856 04/24/15 1210  BP: 106/73 100/70  Pulse: 73 72  Temp: 98.2 F (36.8 C)   TempSrc: Oral   Resp: 19 17  SpO2: 93% 92%     Jeannett Senior, PA-C 04/24/15 [place], MD 04/24/15 1514

## 2015-04-26 ENCOUNTER — Inpatient Hospital Stay (HOSPITAL_COMMUNITY)
Admission: EM | Admit: 2015-04-26 | Discharge: 2015-04-30 | DRG: 812 | Disposition: A | Discharge status: Home / Self Care | Payer: Medicare Other | Attending: Internal Medicine | Admitting: Internal Medicine

## 2015-04-26 ENCOUNTER — Encounter (HOSPITAL_COMMUNITY): Payer: Self-pay | Admitting: Emergency Medicine

## 2015-04-26 ENCOUNTER — Emergency Department (HOSPITAL_COMMUNITY): Payer: Medicare Other

## 2015-04-26 DIAGNOSIS — M79606 Pain in leg, unspecified: Secondary | ICD-10-CM | POA: Diagnosis not present

## 2015-04-26 DIAGNOSIS — I517 Cardiomegaly: Secondary | ICD-10-CM | POA: Diagnosis not present

## 2015-04-26 DIAGNOSIS — R0902 Hypoxemia: Secondary | ICD-10-CM | POA: Diagnosis not present

## 2015-04-26 DIAGNOSIS — D5700 Hb-SS disease with crisis, unspecified: Secondary | ICD-10-CM | POA: Diagnosis not present

## 2015-04-26 DIAGNOSIS — J9611 Chronic respiratory failure with hypoxia: Secondary | ICD-10-CM | POA: Diagnosis not present

## 2015-04-26 DIAGNOSIS — Z7901 Long term (current) use of anticoagulants: Secondary | ICD-10-CM | POA: Diagnosis not present

## 2015-04-26 DIAGNOSIS — I272 Other secondary pulmonary hypertension: Secondary | ICD-10-CM | POA: Diagnosis not present

## 2015-04-26 DIAGNOSIS — N179 Acute kidney failure, unspecified: Secondary | ICD-10-CM

## 2015-04-26 DIAGNOSIS — E875 Hyperkalemia: Secondary | ICD-10-CM

## 2015-04-26 DIAGNOSIS — I482 Chronic atrial fibrillation, unspecified: Secondary | ICD-10-CM

## 2015-04-26 DIAGNOSIS — I10 Essential (primary) hypertension: Secondary | ICD-10-CM

## 2015-04-26 DIAGNOSIS — D649 Anemia, unspecified: Secondary | ICD-10-CM

## 2015-04-26 DIAGNOSIS — D72829 Elevated white blood cell count, unspecified: Secondary | ICD-10-CM | POA: Diagnosis not present

## 2015-04-26 DIAGNOSIS — E869 Volume depletion, unspecified: Secondary | ICD-10-CM | POA: Diagnosis present

## 2015-04-26 DIAGNOSIS — L989 Disorder of the skin and subcutaneous tissue, unspecified: Secondary | ICD-10-CM | POA: Diagnosis present

## 2015-04-26 DIAGNOSIS — N5089 Other specified disorders of the male genital organs: Secondary | ICD-10-CM | POA: Diagnosis not present

## 2015-04-26 DIAGNOSIS — Z87891 Personal history of nicotine dependence: Secondary | ICD-10-CM | POA: Diagnosis not present

## 2015-04-26 DIAGNOSIS — Z7982 Long term (current) use of aspirin: Secondary | ICD-10-CM | POA: Diagnosis not present

## 2015-04-26 DIAGNOSIS — Z79891 Long term (current) use of opiate analgesic: Secondary | ICD-10-CM | POA: Diagnosis not present

## 2015-04-26 DIAGNOSIS — Z79899 Other long term (current) drug therapy: Secondary | ICD-10-CM | POA: Diagnosis not present

## 2015-04-26 DIAGNOSIS — D638 Anemia in other chronic diseases classified elsewhere: Secondary | ICD-10-CM | POA: Diagnosis not present

## 2015-04-26 DIAGNOSIS — Z86711 Personal history of pulmonary embolism: Secondary | ICD-10-CM | POA: Diagnosis not present

## 2015-04-26 DIAGNOSIS — Z86718 Personal history of other venous thrombosis and embolism: Secondary | ICD-10-CM | POA: Diagnosis not present

## 2015-04-26 LAB — CBC
HCT: 20.6 % — ABNORMAL LOW (ref 39.0–52.0)
Hemoglobin: 7.2 g/dL — ABNORMAL LOW (ref 13.0–17.0)
MCH: 30.4 pg (ref 26.0–34.0)
MCHC: 35 g/dL (ref 30.0–36.0)
MCV: 86.9 fL (ref 78.0–100.0)
PLATELETS: 563 10*3/uL — AB (ref 150–400)
RBC: 2.37 MIL/uL — AB (ref 4.22–5.81)
RDW: 21.2 % — AB (ref 11.5–15.5)
WBC: 19.4 10*3/uL — ABNORMAL HIGH (ref 4.0–10.5)

## 2015-04-26 LAB — COMPREHENSIVE METABOLIC PANEL
ALT: 23 U/L (ref 17–63)
AST: 32 U/L (ref 15–41)
Albumin: 4.4 g/dL (ref 3.5–5.0)
Alkaline Phosphatase: 91 U/L (ref 38–126)
Anion gap: 8 (ref 5–15)
BUN: 35 mg/dL — ABNORMAL HIGH (ref 6–20)
CALCIUM: 9 mg/dL (ref 8.9–10.3)
CHLORIDE: 110 mmol/L (ref 101–111)
CO2: 22 mmol/L (ref 22–32)
CREATININE: 1.73 mg/dL — AB (ref 0.61–1.24)
GFR, EST AFRICAN AMERICAN: 57 mL/min — AB (ref >60)
GFR, EST NON AFRICAN AMERICAN: 49 mL/min — AB (ref >60)
Glucose, Bld: 123 mg/dL — ABNORMAL HIGH (ref 65–99)
Potassium: 5.3 mmol/L — ABNORMAL HIGH (ref 3.5–5.1)
Sodium: 140 mmol/L (ref 135–145)
Total Bilirubin: 3.4 mg/dL — ABNORMAL HIGH (ref 0.3–1.2)
Total Protein: 7.8 g/dL (ref 6.5–8.1)

## 2015-04-26 LAB — I-STAT CHEM 8, ED
BUN: 32 mg/dL — ABNORMAL HIGH (ref 6–20)
CHLORIDE: 110 mmol/L (ref 101–111)
Calcium, Ion: 1.24 mmol/L — ABNORMAL HIGH (ref 1.12–1.23)
Creatinine, Ser: 1.6 mg/dL — ABNORMAL HIGH (ref 0.61–1.24)
Glucose, Bld: 114 mg/dL — ABNORMAL HIGH (ref 65–99)
HEMATOCRIT: 24 % — AB (ref 39.0–52.0)
Hemoglobin: 8.2 g/dL — ABNORMAL LOW (ref 13.0–17.0)
POTASSIUM: 5.4 mmol/L — AB (ref 3.5–5.1)
SODIUM: 141 mmol/L (ref 135–145)
TCO2: 21 mmol/L (ref 0–100)

## 2015-04-26 LAB — URINALYSIS, ROUTINE W REFLEX MICROSCOPIC
BILIRUBIN URINE: NEGATIVE
Glucose, UA: NEGATIVE mg/dL
Hgb urine dipstick: NEGATIVE
Ketones, ur: NEGATIVE mg/dL
LEUKOCYTES UA: NEGATIVE
NITRITE: NEGATIVE
Protein, ur: NEGATIVE mg/dL
SPECIFIC GRAVITY, URINE: 1.012 (ref 1.005–1.030)
pH: 6 (ref 5.0–8.0)

## 2015-04-26 LAB — I-STAT CG4 LACTIC ACID, ED: Lactic Acid, Venous: 0.5 mmol/L (ref 0.5–2.0)

## 2015-04-26 LAB — RETICULOCYTES
RBC.: 2.37 MIL/uL — AB (ref 4.22–5.81)
RETIC CT PCT: 6.9 % — AB (ref 0.4–3.1)
Retic Count, Absolute: 163.5 10*3/uL (ref 19.0–186.0)

## 2015-04-26 LAB — TROPONIN I: TROPONIN I: 0.08 ng/mL — AB (ref <0.031)

## 2015-04-26 MED ORDER — SODIUM CHLORIDE 0.9 % IV BOLUS (SEPSIS)
500.0000 mL | Freq: Once | INTRAVENOUS | Status: AC
  Administered 2015-04-26: 500 mL via INTRAVENOUS

## 2015-04-26 MED ORDER — ENOXAPARIN SODIUM 120 MG/0.8ML ~~LOC~~ SOLN
120.0000 mg | Freq: Every day | SUBCUTANEOUS | Status: DC
  Administered 2015-04-26 – 2015-04-29 (×4): 120 mg via SUBCUTANEOUS
  Filled 2015-04-26 (×5): qty 0.8

## 2015-04-26 MED ORDER — SODIUM CHLORIDE 0.9 % IV SOLN
25.0000 mg | Freq: Once | INTRAVENOUS | Status: AC
  Administered 2015-04-26: 25 mg via INTRAVENOUS
  Filled 2015-04-26: qty 0.5

## 2015-04-26 MED ORDER — ZOLPIDEM TARTRATE 10 MG PO TABS
10.0000 mg | ORAL_TABLET | Freq: Every evening | ORAL | Status: DC | PRN
Start: 2015-04-26 — End: 2015-04-30
  Administered 2015-04-26 – 2015-04-29 (×4): 10 mg via ORAL
  Filled 2015-04-26 (×4): qty 1

## 2015-04-26 MED ORDER — HYDROMORPHONE HCL 1 MG/ML IJ SOLN
2.0000 mg | Freq: Once | INTRAMUSCULAR | Status: AC
  Administered 2015-04-26: 2 mg via INTRAVENOUS
  Filled 2015-04-26: qty 2

## 2015-04-26 MED ORDER — FOLIC ACID 1 MG PO TABS
1.0000 mg | ORAL_TABLET | Freq: Every morning | ORAL | Status: DC
  Administered 2015-04-26 – 2015-04-30 (×5): 1 mg via ORAL
  Filled 2015-04-26 (×5): qty 1

## 2015-04-26 MED ORDER — METOPROLOL SUCCINATE ER 50 MG PO TB24
50.0000 mg | ORAL_TABLET | Freq: Every day | ORAL | Status: DC
  Administered 2015-04-26 – 2015-04-30 (×5): 50 mg via ORAL
  Filled 2015-04-26 (×5): qty 1

## 2015-04-26 MED ORDER — HYDROMORPHONE 1 MG/ML IV SOLN
INTRAVENOUS | Status: DC
  Administered 2015-04-26: 13:00:00 via INTRAVENOUS
  Administered 2015-04-26: 7.7 mg via INTRAVENOUS
  Administered 2015-04-26: 7.1 mg via INTRAVENOUS
  Administered 2015-04-27: 10.5 mg via INTRAVENOUS
  Administered 2015-04-27: 2.62 mg via INTRAVENOUS
  Administered 2015-04-27: 4.9 mg via INTRAVENOUS
  Administered 2015-04-27: 9.8 mg via INTRAVENOUS
  Administered 2015-04-27: 16:00:00 via INTRAVENOUS
  Administered 2015-04-27: 7 mg via INTRAVENOUS
  Administered 2015-04-28: 9.1 mg via INTRAVENOUS
  Administered 2015-04-28: 7 mg via INTRAVENOUS
  Administered 2015-04-28: 7.7 mg via INTRAVENOUS
  Administered 2015-04-28: 06:00:00 via INTRAVENOUS
  Administered 2015-04-28: 7 mg via INTRAVENOUS
  Administered 2015-04-28: 25 mg via INTRAVENOUS
  Administered 2015-04-29: 9.8 mg via INTRAVENOUS
  Administered 2015-04-29: 4.6 mg via INTRAVENOUS
  Administered 2015-04-29: 9.1 mg via INTRAVENOUS
  Administered 2015-04-29: 1.7 mg via INTRAVENOUS
  Administered 2015-04-29: 7 mg via INTRAVENOUS
  Administered 2015-04-29: 3.6 mg via INTRAVENOUS
  Administered 2015-04-30: 9.8 mg via INTRAVENOUS
  Administered 2015-04-30: 12.6 mg via INTRAVENOUS
  Administered 2015-04-30: via INTRAVENOUS
  Administered 2015-04-30: 7.7 mg via INTRAVENOUS
  Administered 2015-04-30: 8.4 mg via INTRAVENOUS
  Filled 2015-04-26 (×8): qty 25

## 2015-04-26 MED ORDER — DIPHENHYDRAMINE HCL 50 MG/ML IJ SOLN
25.0000 mg | Freq: Four times a day (QID) | INTRAMUSCULAR | Status: DC | PRN
  Administered 2015-04-26 – 2015-04-29 (×9): 25 mg via INTRAVENOUS
  Filled 2015-04-26 (×10): qty 1

## 2015-04-26 MED ORDER — VITAMIN D 1000 UNITS PO TABS
2000.0000 [IU] | ORAL_TABLET | Freq: Every day | ORAL | Status: DC
  Administered 2015-04-26 – 2015-04-30 (×5): 2000 [IU] via ORAL
  Filled 2015-04-26 (×5): qty 2

## 2015-04-26 MED ORDER — GABAPENTIN 300 MG PO CAPS
300.0000 mg | ORAL_CAPSULE | Freq: Three times a day (TID) | ORAL | Status: DC
  Administered 2015-04-26 – 2015-04-30 (×13): 300 mg via ORAL
  Filled 2015-04-26 (×13): qty 1

## 2015-04-26 MED ORDER — ASPIRIN 81 MG PO CHEW
81.0000 mg | CHEWABLE_TABLET | Freq: Every day | ORAL | Status: DC
  Administered 2015-04-26 – 2015-04-30 (×5): 81 mg via ORAL
  Filled 2015-04-26 (×5): qty 1

## 2015-04-26 MED ORDER — DEXTROSE-NACL 5-0.45 % IV SOLN
INTRAVENOUS | Status: DC
  Administered 2015-04-26 (×2): via INTRAVENOUS
  Administered 2015-04-28: 1000 mL via INTRAVENOUS
  Administered 2015-04-29 – 2015-04-30 (×2): via INTRAVENOUS

## 2015-04-26 MED ORDER — HYDROXYUREA 500 MG PO CAPS
500.0000 mg | ORAL_CAPSULE | Freq: Every day | ORAL | Status: DC
  Administered 2015-04-26 – 2015-04-28 (×3): 500 mg via ORAL
  Filled 2015-04-26 (×3): qty 1

## 2015-04-26 MED ORDER — LISINOPRIL 10 MG PO TABS
10.0000 mg | ORAL_TABLET | Freq: Every day | ORAL | Status: DC
  Administered 2015-04-26 – 2015-04-30 (×5): 10 mg via ORAL
  Filled 2015-04-26 (×5): qty 1

## 2015-04-26 NOTE — ED Notes (Addendum)
Phlebotomy at bedside to attempt blood specimen and was unsuccessful.

## 2015-04-26 NOTE — ED Provider Notes (Addendum)
CSN: [id number]     Arrival date & time 04/26/15  0818 History   First MD Initiated Contact with Patient 04/26/15 [phone]     Chief Complaint  Patient presents with  . Sickle Cell Pain Crisis     (Consider location/radiation/quality/duration/timing/severity/associated sxs/prior Treatment) Patient is a 35 y.o. male presenting with sickle cell pain. The history is provided by the patient.  Sickle Cell Pain Crisis Associated symptoms: no chest pain, no fever, no headaches, no shortness of breath, no sore throat and no vomiting   Patient with hx sickle cell anemia, c/o having a sickle cell pain crisis. Pt c/o pain in back/extremities/diffuse. States same symptoms as w prior sickle cell pain. Pain severe, constant, not controlled w home meds. Denies cough, sore throat, or uri c/o. Denies chest pain or sob. No fever or chills. No nvd.  States compliant w normal meds.      Past Medical History  Diagnosis Date  . Sickle cell anemia (HCC)   . Blood transfusion   . Acute embolism and thrombosis of right internal jugular vein ([place])   . Hypokalemia   . Mood disorder ([place])   . History of pulmonary embolus (PE)   . Avascular necrosis (HCC)     Right Hip  . Leukocytosis     Chronic  . Thrombocytosis (HCC)     Chronic  . Hypertension   . History of Clostridium difficile infection   . Uses marijuana   . Chronic anticoagulation   . Functional asplenia   . Former smoker   . Second hand tobacco smoke exposure   . Alcohol consumption of one to four drinks per day   . Noncompliance with medication regimen   . Sickle-cell crisis with associated acute chest syndrome ([place]) 05/13/2013  . Acute chest syndrome ([place]) 06/18/2013  . Demand ischemia ([place]) 01/02/2014  . Pulmonary hypertension [facility])    Past Surgical History  Procedure Laterality Date  . Right hip replacement      08/2006  . Cholecystectomy      01/2008  . Porta cath placement    . Porta cath removal    . Umbilical hernia repair       01/2008  . Excision of left periauricular cyst      10/2009  . Excision of right ear lobe cyst with primary closur      11/2007  . Portacath placement  01/05/2012    Procedure: INSERTION PORT-A-CATH;  Surgeon: Odis Hollingshead, MD;  Location: [place];  Service: General;  Laterality: N/A;  ultrasound guiced port a cath insertion with fluoroscopy   Family History  Problem Relation Age of Onset  . Sickle cell trait Mother   . Depression Mother   . Diabetes Mother   . Sickle cell trait Father   . Sickle cell trait Brother    Social History  Substance Use Topics  . Smoking status: Former Smoker -- 13 years  . Smokeless tobacco: Never Used  . Alcohol Use: No    Review of Systems  Constitutional: Negative for fever.  HENT: Negative for sore throat.   Eyes: Negative for redness.  Respiratory: Negative for shortness of breath.   Cardiovascular: Negative for chest pain.  Gastrointestinal: Negative for vomiting and abdominal pain.  Genitourinary: Negative for flank pain.  Musculoskeletal: Negative for neck pain and neck stiffness.  Skin: Negative for rash.  Neurological: Negative for headaches.  Hematological: Does not bruise/bleed easily.  Psychiatric/Behavioral: Negative for confusion.      Allergies  Review of patient's allergies indicates no known allergies.  Home Medications   Prior to Admission medications   Medication Sig Start Date End Date Taking? Authorizing Provider  aspirin 81 MG chewable tablet Chew 1 tablet (81 mg total) by mouth daily. 07/23/14   [name], MD  Cholecalciferol (VITAMIN D) 2000 units tablet Take 1 tablet (2,000 Units total) by mouth daily. 02/23/15   Dorena Dew, FNP  cyclobenzaprine (FLEXERIL) 5 MG tablet Take 1 tablet (5 mg total) by mouth 3 (three) times daily as needed for muscle spasms. Patient not taking: Reported on 03/06/2015 02/19/15   Alfonzo Beers, MD  enoxaparin (LOVENOX) 120 MG/0.8ML injection Inject 0.8 mLs (120 mg total)  into the skin daily. 12/03/14   [name], MD  folic acid (FOLVITE) 1 MG tablet Take 1 tablet (1 mg total) by mouth every morning. 02/23/15   Dorena Dew, FNP  gabapentin (NEURONTIN) 300 MG capsule Take 1 capsule (300 mg total) by mouth 3 (three) times daily. 04/08/15   [name], MD  HYDROmorphone (DILAUDID) 4 MG tablet Take 1 tablet (4 mg total) by mouth every 4 (four) hours as needed for severe pain. Patient taking differently: Take 4 mg by mouth 3 (three) times daily as needed for severe pain.  04/06/15   Dorena Dew, FNP  hydroxyurea (HYDREA) 500 MG capsule Take 1 capsule (500 mg total) by mouth daily. May take with food to minimize GI side effects. 11/06/14   Dorena Dew, FNP  lisinopril (PRINIVIL,ZESTRIL) 10 MG tablet Take 1 tablet (10 mg total) by mouth daily. 04/13/15   Dorena Dew, FNP  metoprolol succinate (TOPROL-XL) 50 MG 24 hr tablet Take 1 tablet (50 mg total) by mouth daily. 02/23/15   Dorena Dew, FNP  morphine (MS CONTIN) 30 MG 12 hr tablet Take 1 tablet (30 mg total) by mouth every 12 (twelve) hours. 04/06/15   Dorena Dew, FNP  potassium chloride SA (K-DUR,KLOR-CON) 20 MEQ tablet Take 1 tablet (20 mEq total) by mouth every morning. 04/13/15   Dorena Dew, FNP  zolpidem (AMBIEN) 10 MG tablet Take 1 tablet (10 mg total) by mouth at bedtime as needed for sleep. 04/10/15   Dorena Dew, FNP   BP 108/71 mmHg  Pulse 84  Temp(Src) 98 F (36.7 C) (Oral)  Resp 18  Ht 6' (1.829 m)  Wt 77.111 kg  BMI 23.05 kg/m2  SpO2 88% Physical Exam  Constitutional: He is oriented to person, place, and time. He appears well-developed and well-nourished. No distress.  HENT:  Head: Atraumatic.  Mouth/Throat: Oropharynx is clear and moist.  Eyes: Conjunctivae are normal.  Neck: Neck supple. No tracheal deviation present.  Cardiovascular: Normal rate, regular rhythm, normal heart sounds and intact distal pulses.  Exam reveals no gallop and no friction  rub.   No murmur heard. Pulmonary/Chest: Effort normal and breath sounds normal. No accessory muscle usage. No respiratory distress.  Port left chest without sign of infection  Abdominal: Soft. Bowel sounds are normal. He exhibits no distension. There is no tenderness.  Musculoskeletal: Normal range of motion. He exhibits no edema or tenderness.  Neurological: He is alert and oriented to person, place, and time.  Skin: Skin is warm and dry. No rash noted. He is not diaphoretic.  Psychiatric: He has a normal mood and affect.  Nursing note and vitals reviewed.   ED Course  Procedures (including critical care time) Labs Review  Results for orders placed  or performed during the hospital encounter of 04/26/15  CBC  Result Value Ref Range   WBC 19.4 (H) 4.0 - 10.5 K/uL   RBC 2.37 (L) 4.22 - 5.81 MIL/uL   Hemoglobin 7.2 (L) 13.0 - 17.0 g/dL   HCT 20.6 (L) 39.0 - 52.0 %   MCV 86.9 78.0 - 100.0 fL   MCH 30.4 26.0 - 34.0 pg   MCHC 35.0 30.0 - 36.0 g/dL   RDW 21.2 (H) 11.5 - 15.5 %   Platelets 563 (H) 150 - 400 K/uL  Comprehensive metabolic panel  Result Value Ref Range   Sodium 140 135 - 145 mmol/L   Potassium 5.3 (H) 3.5 - 5.1 mmol/L   Chloride 110 101 - 111 mmol/L   CO2 22 22 - 32 mmol/L   Glucose, Bld 123 (H) 65 - 99 mg/dL   BUN 35 (H) 6 - 20 mg/dL   Creatinine, Ser 1.73 (H) 0.61 - 1.24 mg/dL   Calcium 9.0 8.9 - 10.3 mg/dL   Total Protein 7.8 6.5 - 8.1 g/dL   Albumin 4.4 3.5 - 5.0 g/dL   AST 32 15 - 41 U/L   ALT 23 17 - 63 U/L   Alkaline Phosphatase 91 38 - 126 U/L   Total Bilirubin 3.4 (H) 0.3 - 1.2 mg/dL   GFR calc non Af Amer 49 (L) >60 mL/min   GFR calc Af Amer 57 (L) >60 mL/min   Anion gap 8 5 - 15  Reticulocytes  Result Value Ref Range   Retic Ct Pct 6.9 (H) 0.4 - 3.1 %   RBC. 2.37 (L) 4.22 - 5.81 MIL/uL   Retic Count, Manual 163.5 19.0 - 186.0 K/uL   *Note: Due to a large number of results and/or encounters for the requested time period, some results have not been  displayed. A complete set of results can be found in Results Review.   Dg Chest 2 View  04/26/2015  CLINICAL DATA:  BILATERAL flank pain for 3 days. Sickle cell crisis. EXAM: CHEST  2 VIEW COMPARISON:  04/24/2015. FINDINGS: Cardiomegaly. Port-A-Cath unchanged, tip in RIGHT atrium. Mild vascular congestion. No infiltrates, or effusion. Sickle cell changes in the visualized osseous structures. IMPRESSION: Cardiomegaly.  No acute abnormality noted. Electronically Signed   By: Staci Righter M.D.   On: 04/26/2015 09:45   Dg Chest 2 View  04/24/2015  CLINICAL DATA:  Sickle cell crisis with chest pain, initial encounter EXAM: CHEST  2 VIEW COMPARISON:  03/19/2015 FINDINGS: Cardiac shadow is enlarged. A left chest wall port is again seen and stable. The lungs are well aerated bilaterally. No acute bony abnormality is seen. IMPRESSION: No acute abnormality noted. Electronically Signed   By: Inez Catalina M.D.   On: 04/24/2015 09:59       I have personally reviewed and evaluated these images and lab results as part of my medical decision-making.    MDM   Iv ns 500 cc bolus. o2 [place]. Dilaudid iv.   Pt requests benadryl, itching.  No sign allergic rxn on exam. Benadryl ivpb given.  Reviewed nursing notes and prior charts for additional history.   On recheck of pt, pt adds that he did have night sweats the past couple nights, ?fevers.   From labs, AKI.  Pt also is noted to be mildly hypoxic, with room air sats 89%.   Pt denies increased cough, no source infection noted on exam/hx.  Will add lactate, ua and blood cxs to workup.  Medical service/sickle cell  team contacted for admission.  Recheck pt, no new symptoms or change in exam from initial.  Patient indicates pain persists.  Dilaudid iv.   Discussed plan for admission w pt.        Lajean Saver, MD 04/26/15 [phone]

## 2015-04-26 NOTE — ED Notes (Signed)
Pt c/o bilat side pain onset yesterday. States hx of sickle cell and this feels like a sickle cell crisis.

## 2015-04-26 NOTE — ED Notes (Signed)
Pt taken to x-ray and returned to room without distress noted. 

## 2015-04-26 NOTE — ED Notes (Signed)
Pt was seen here 3/24 for same, sent home with pain meds, pain persists.

## 2015-04-26 NOTE — ED Notes (Signed)
Pt given breakfast tray

## 2015-04-26 NOTE — H&P (Signed)
[facility] History and Physical  [name] [id number] DOB: 1980/12/20 DOA: 04/26/2015   PCP: [name], MD    Chief Complaint: Joint Pains  HPI: [name] is a 35 y.o. male with sickle cell anemia, prior jugular vein embolism on chronic anticoagulation, hypertension, acute chest syndrome, came to the emergency department complaining of bilateral rib pain, leg pain and shoulder pain. Patient states this feels like typical sickle cell crisis for him. He denies any cough. He states he does not think he had fever but states he did sweat prior 2 nights. He denies any shortness of breath. Denies any abdominal pain. No pain anywhere else in the body. He states he took Dilaudid this morning for pain which did not help so decided to come to the ER. He states nothing is making his pain worse or better. It is not pleuritic.  In the ED, CXR was negative for acute disease. His Hb was 7.2, WBC was high but this is not new, temperature was normal, no tachycardia. Renal function showed acute renal failure with high Urea of 35 and Creatinine of 1.73 as against baseline of 0.66. Patient will be admitted for sickle cell pain crisis and acute renal failure.  General: The patient denies anorexia, fever, weight loss Cardiac: Denies chest pain, syncope, palpitations, pedal edema  Respiratory: Denies cough, shortness of breath, wheezing GI: Denies severe indigestion/heartburn, abdominal pain, nausea, vomiting, diarrhea and constipation GU: Denies hematuria, incontinence, dysuria  Musculoskeletal: Denies arthritis  Skin: Denies suspicious skin lesions Neurologic: Denies focal weakness or numbness, change in vision Psychiatry: Denies depression or anxiety. Hematologic: no easy bruising or bleeding  All other systems reviewed and found to be negative.  Past Medical History  Diagnosis Date  . Sickle cell anemia (HCC)   . Blood transfusion   . Acute embolism and thrombosis of  right internal jugular vein ([place])   . Hypokalemia   . Mood disorder ([place])   . History of pulmonary embolus (PE)   . Avascular necrosis (HCC)     Right Hip  . Leukocytosis     Chronic  . Thrombocytosis (HCC)     Chronic  . Hypertension   . History of Clostridium difficile infection   . Uses marijuana   . Chronic anticoagulation   . Functional asplenia   . Former smoker   . Second hand tobacco smoke exposure   . Alcohol consumption of one to four drinks per day   . Noncompliance with medication regimen   . Sickle-cell crisis with associated acute chest syndrome ([name]) 05/13/2013  . Acute chest syndrome ([place]) 06/18/2013  . Demand ischemia ([place]) 01/02/2014  . Pulmonary hypertension [facility])     Past Surgical History  Procedure Laterality Date  . Right hip replacement      08/2006  . Cholecystectomy      01/2008  . Porta cath placement    . Porta cath removal    . Umbilical hernia repair      01/2008  . Excision of left periauricular cyst      10/2009  . Excision of right ear lobe cyst with primary closur      11/2007  . Portacath placement  01/05/2012    Procedure: INSERTION PORT-A-CATH;  Surgeon: Odis Hollingshead, MD;  Location: [facility];  Service: General;  Laterality: N/A;  ultrasound guiced port a cath insertion with fluoroscopy   Prior to Admission medications   Medication Sig Start Date End Date Taking? Authorizing Provider  aspirin  81 MG chewable tablet Chew 1 tablet (81 mg total) by mouth daily. 07/23/14  Yes [name], MD  Cholecalciferol (VITAMIN D) 2000 units tablet Take 1 tablet (2,000 Units total) by mouth daily. 02/23/15  Yes Dorena Dew, FNP  cyclobenzaprine (FLEXERIL) 5 MG tablet Take 1 tablet (5 mg total) by mouth 3 (three) times daily as needed for muscle spasms. 02/19/15  Yes Alfonzo Beers, MD  enoxaparin (LOVENOX) 120 MG/0.8ML injection Inject 0.8 mLs (120 mg total) into the skin daily. 12/03/14  Yes [name], MD  folic acid (FOLVITE) 1 MG  tablet Take 1 tablet (1 mg total) by mouth every morning. 02/23/15  Yes Dorena Dew, FNP  gabapentin (NEURONTIN) 300 MG capsule Take 1 capsule (300 mg total) by mouth 3 (three) times daily. 04/08/15  Yes [name], MD  HYDROmorphone (DILAUDID) 4 MG tablet Take 1 tablet (4 mg total) by mouth every 4 (four) hours as needed for severe pain. Patient taking differently: Take 4 mg by mouth 3 (three) times daily as needed for severe pain.  04/06/15  Yes Dorena Dew, FNP  hydroxyurea (HYDREA) 500 MG capsule Take 1 capsule (500 mg total) by mouth daily. May take with food to minimize GI side effects. 11/06/14  Yes Dorena Dew, FNP  lisinopril (PRINIVIL,ZESTRIL) 10 MG tablet Take 1 tablet (10 mg total) by mouth daily. 04/13/15  Yes Dorena Dew, FNP  metoprolol succinate (TOPROL-XL) 50 MG 24 hr tablet Take 1 tablet (50 mg total) by mouth daily. 02/23/15  Yes Dorena Dew, FNP  morphine (MS CONTIN) 30 MG 12 hr tablet Take 1 tablet (30 mg total) by mouth every 12 (twelve) hours. 04/06/15  Yes Dorena Dew, FNP  potassium chloride SA (K-DUR,KLOR-CON) 20 MEQ tablet Take 1 tablet (20 mEq total) by mouth every morning. 04/13/15  Yes Dorena Dew, FNP  zolpidem (AMBIEN) 10 MG tablet Take 1 tablet (10 mg total) by mouth at bedtime as needed for sleep. 04/10/15  Yes Dorena Dew, FNP   Physical Exam: Filed Vitals:   04/26/15 0900 04/26/15 1006 04/26/15 1026 04/26/15 1100  BP: 99/64 112/84 112/84 117/76  Pulse: 81 76 76 77  Temp:   98 F (36.7 C)   TempSrc:   Oral   Resp:   18 22  Height:      Weight:      SpO2: 90% 92% 94% 94%     General: Conscious and alert, not in any obvious distress HEENT: Normocephalic and Atraumatic, Mucous membranes pink                PERRLA; EOM intact; No scleral icterus,                 Nares: Patent, Oropharynx: Clear, Fair Dentition                 Neck: FROM, no cervical lymphadenopathy, thyromegaly, carotid bruit or JVD;  Breasts:  deferred CHEST WALL: No tenderness  CHEST: Normal respiration, clear to auscultation bilaterally  HEART: Regular rate and rhythm; no murmurs rubs or gallops  BACK: No kyphosis or scoliosis; no CVA tenderness  GI: Positive Bowel Sounds, soft, non-tender; no masses, no organomegaly MSK: No cyanosis, clubbing, or edema Genitalia: not examined  SKIN:  no rash or ulceration  CNS: Alert and Oriented x 4, Nonfocal exam, CN 2-12 intact  Labs on Admission:  Basic Metabolic Panel:  Recent Labs Lab 04/24/15 1032 04/26/15 0856 04/26/15 1146  NA 141 140 141  K 4.6 5.3* 5.4*  CL 110 110 110  CO2 22 22  --   GLUCOSE 96 123* 114*  BUN 13 35* 32*  CREATININE 0.66 1.73* 1.60*  CALCIUM 9.5 9.0  --    Liver Function Tests:  Recent Labs Lab 04/24/15 1032 04/26/15 0856  AST 36 32  ALT 30 23  ALKPHOS 91 91  BILITOT 4.2* 3.4*  PROT 8.2* 7.8  ALBUMIN 4.3 4.4   No results for input(s): LIPASE, AMYLASE in the last 168 hours. No results for input(s): AMMONIA in the last 168 hours. CBC:  Recent Labs Lab 04/24/15 1032 04/26/15 0856 04/26/15 1146  WBC 17.4* 19.4*  --   NEUTROABS 13.7*  --   --   HGB 7.8* 7.2* 8.2*  HCT 22.9* 20.6* 24.0*  MCV 87.7 86.9  --   PLT 602* 563*  --    Cardiac Enzymes:  Recent Labs Lab 04/26/15 0856  TROPONINI 0.08*    BNP (last 3 results)  Recent Labs  08/29/14 0810 09/10/14 0612 11/30/14 0910  BNP 1117.0* 549.5* 639.7*    ProBNP (last 3 results) No results for input(s): PROBNP in the last 8760 hours.  CBG: No results for input(s): GLUCAP in the last 168 hours.  Radiological Exams on Admission: Dg Chest 2 View  04/26/2015  CLINICAL DATA:  BILATERAL flank pain for 3 days. Sickle cell crisis. EXAM: CHEST  2 VIEW COMPARISON:  04/24/2015. FINDINGS: Cardiomegaly. Port-A-Cath unchanged, tip in RIGHT atrium. Mild vascular congestion. No infiltrates, or effusion. Sickle cell changes in the visualized osseous structures. IMPRESSION: Cardiomegaly.   No acute abnormality noted. Electronically Signed   By: Staci Righter M.D.   On: 04/26/2015 09:45    EKG: Independently reviewed. NSR  Assessment/Plan Active Problems:   Sickle cell anemia with crisis ([place]) - Admit to Med-Surg 3 [place] - Patient has severe pain over ribs, legs and back, which could not be relieved by his home pain med. Increased WBC but No signs of infection. Chest x-ray is negative. Hemoglobin stable 7.2. No indication for blood transfusion. Evidence of Acute Renal Failure, likely due to dehydration - IVF D5.45NS at 125 cc/hr - Start weight based Dilaudid PCA per Sickle Cell pain management protocol - Benadryl for itching - Zofran for Nausea - Continue home medications: Hydroxyurea, Folic Acid - Labs in am  #2. Hx of pulmonary embolus: Continue [place] Lovenox full dose  #3. Acute Renal Failure: Likely from dehydration, rehydrate and recheck BMP in am. Hold Toradol for now  #4. Pulmonary HTN ([place]): Incentive Spirometry, keep O2 Sat >92%  #5. Essential hypertension: Continue home medications  #6. Chronic pain syndrome: PCA, will transition to oral meds once pain is better controlled  #7. Leukocytosis: Likely reactive to sickle cell crisis. No fever. No evidence of infection, CXR is Negative. No indication for antibiotics for now. Blood Cx ordered.  Consulted: None  Code Status: Full Family Communication: No family member present DVT Prophylaxis: Continue Full Dose Lovenox (Home Medication)  Time spent: 41 Min  Jillien Yakel, Gabrielle Dare, MD [facility]  If 7PM-7AM, please contact night-coverage [email or web address] 04/26/2015, 11:49 AM

## 2015-04-26 NOTE — ED Notes (Signed)
unsuccessful attempt with labs

## 2015-04-26 NOTE — Progress Notes (Signed)
Utilization review completed.  

## 2015-04-26 NOTE — ED Notes (Signed)
Main Phleb to come and attempt draw.

## 2015-04-27 ENCOUNTER — Telehealth: Payer: Self-pay

## 2015-04-27 ENCOUNTER — Ambulatory Visit: Payer: Self-pay | Admitting: Family Medicine

## 2015-04-27 ENCOUNTER — Inpatient Hospital Stay (HOSPITAL_COMMUNITY): Payer: Medicare Other

## 2015-04-27 DIAGNOSIS — D5700 Hb-SS disease with crisis, unspecified: Secondary | ICD-10-CM | POA: Diagnosis not present

## 2015-04-27 DIAGNOSIS — N5089 Other specified disorders of the male genital organs: Secondary | ICD-10-CM | POA: Diagnosis not present

## 2015-04-27 DIAGNOSIS — J9611 Chronic respiratory failure with hypoxia: Secondary | ICD-10-CM | POA: Diagnosis not present

## 2015-04-27 DIAGNOSIS — M79606 Pain in leg, unspecified: Secondary | ICD-10-CM | POA: Diagnosis not present

## 2015-04-27 DIAGNOSIS — D638 Anemia in other chronic diseases classified elsewhere: Secondary | ICD-10-CM

## 2015-04-27 DIAGNOSIS — D596 Hemoglobinuria due to hemolysis from other external causes: Secondary | ICD-10-CM

## 2015-04-27 DIAGNOSIS — I272 Other secondary pulmonary hypertension: Secondary | ICD-10-CM

## 2015-04-27 DIAGNOSIS — D72829 Elevated white blood cell count, unspecified: Secondary | ICD-10-CM

## 2015-04-27 LAB — CBC WITH DIFFERENTIAL/PLATELET
Basophils Absolute: 0 10*3/uL (ref 0.0–0.1)
Basophils Relative: 0 %
EOS ABS: 0.5 10*3/uL (ref 0.0–0.7)
Eosinophils Relative: 3 %
HCT: 19.3 % — ABNORMAL LOW (ref 39.0–52.0)
Hemoglobin: 6.7 g/dL — CL (ref 13.0–17.0)
LYMPHS ABS: 3 10*3/uL (ref 0.7–4.0)
Lymphocytes Relative: 17 %
MCH: 30.6 pg (ref 26.0–34.0)
MCHC: 34.7 g/dL (ref 30.0–36.0)
MCV: 88.1 fL (ref 78.0–100.0)
MONO ABS: 2.5 10*3/uL — AB (ref 0.1–1.0)
Monocytes Relative: 14 %
NEUTROS PCT: 66 %
Neutro Abs: 11.6 10*3/uL — ABNORMAL HIGH (ref 1.7–7.7)
PLATELETS: 534 10*3/uL — AB (ref 150–400)
RBC: 2.19 MIL/uL — AB (ref 4.22–5.81)
RDW: 21 % — AB (ref 11.5–15.5)
WBC: 17.6 10*3/uL — AB (ref 4.0–10.5)

## 2015-04-27 LAB — COMPREHENSIVE METABOLIC PANEL
ALK PHOS: 92 U/L (ref 38–126)
ALT: 22 U/L (ref 17–63)
ANION GAP: 7 (ref 5–15)
AST: 34 U/L (ref 15–41)
Albumin: 4.8 g/dL (ref 3.5–5.0)
BILIRUBIN TOTAL: 2.9 mg/dL — AB (ref 0.3–1.2)
BUN: 21 mg/dL — ABNORMAL HIGH (ref 6–20)
CALCIUM: 8.9 mg/dL (ref 8.9–10.3)
CO2: 22 mmol/L (ref 22–32)
Chloride: 110 mmol/L (ref 101–111)
Creatinine, Ser: 0.88 mg/dL (ref 0.61–1.24)
Glucose, Bld: 111 mg/dL — ABNORMAL HIGH (ref 65–99)
Potassium: 5.1 mmol/L (ref 3.5–5.1)
SODIUM: 139 mmol/L (ref 135–145)
TOTAL PROTEIN: 8.1 g/dL (ref 6.5–8.1)

## 2015-04-27 LAB — RETICULOCYTES
RBC.: 2.19 MIL/uL — AB (ref 4.22–5.81)
RETIC COUNT ABSOLUTE: 109.5 10*3/uL (ref 19.0–186.0)
RETIC CT PCT: 5 % — AB (ref 0.4–3.1)

## 2015-04-27 LAB — ECHOCARDIOGRAM COMPLETE
Height: 72 in
WEIGHTICAEL: 2720 [oz_av]

## 2015-04-27 LAB — LACTATE DEHYDROGENASE: LDH: 415 U/L — ABNORMAL HIGH (ref 98–192)

## 2015-04-27 MED ORDER — SENNOSIDES-DOCUSATE SODIUM 8.6-50 MG PO TABS
1.0000 | ORAL_TABLET | Freq: Two times a day (BID) | ORAL | Status: DC
  Administered 2015-04-27 – 2015-04-30 (×6): 1 via ORAL
  Filled 2015-04-27 (×7): qty 1

## 2015-04-27 MED ORDER — HYDROMORPHONE HCL 2 MG/ML IJ SOLN
2.0000 mg | INTRAMUSCULAR | Status: DC | PRN
  Administered 2015-04-27 – 2015-04-29 (×21): 2 mg via INTRAVENOUS
  Filled 2015-04-27 (×20): qty 1

## 2015-04-27 MED ORDER — KETOROLAC TROMETHAMINE 30 MG/ML IJ SOLN
15.0000 mg | Freq: Four times a day (QID) | INTRAMUSCULAR | Status: DC
  Administered 2015-04-27 – 2015-04-30 (×11): 15 mg via INTRAVENOUS
  Filled 2015-04-27 (×11): qty 1

## 2015-04-27 MED ORDER — POLYETHYLENE GLYCOL 3350 17 G PO PACK: 17.0000 g | PACK | Freq: Every day | ORAL | Status: DC | PRN

## 2015-04-27 MED ORDER — KETOROLAC TROMETHAMINE 30 MG/ML IJ SOLN: 15.0000 mg | Freq: Four times a day (QID) | INTRAMUSCULAR | Status: DC

## 2015-04-27 NOTE — Progress Notes (Addendum)
SICKLE CELL SERVICE PROGRESS NOTE  [name] [id number] DOB: 11/23/1980 DOA: 04/26/2015 PCP: [name], MD  Assessment/Plan: Active Problems:   Sickle cell anemia with crisis ([place])   AKI (acute kidney injury) ([place])   Hyperkalemia   Hypoxia   Leucocytosis  1. Hb SS with crisis: Pt reports that his pain is 7-8/10 and localized to the ribs. Will add Toradol since renal function back to normal. Add clinician assisted Dilaudid on a  PRN basis.  2. Leukocytosis: Only evidence of infection is possible genital herpetic lesion. Will send Viral PCR. Hold on starting empiric Valacyclovir.  3. Anemia of chronic disease: Hb at baseline. No increase in Hypoxia. Will monitor Hb levels.  4. Chronic Respiratory Failure with Hypoxia and Acute hemolysis: There is a decrease reticulocyte response in the context of the anemia. Likely related to Hydrea. Will hold Hydrea and re-assess reticulocyte response.  5. Chronic pain: Continue MS Contin. 6. Penile Lesion: Appears herpetic in nature. Will send for DNA PCR.  7. Acute kidney injury: Resolved. Likely secondary to a state of volume depletion.  Code Status: Full Code Family Communication: N/A Disposition Plan: Not yet ready for discharge  [place].  Pager [phone]. If 7PM-7AM, please contact night-coverage.  04/27/2015, 10:34 AM  LOS: 1 day   Interim History:  Pt reports that his pain is 7-8/10 and localized to the ribs. He also reports that he's had a penile lesion which was noted in the last several days. He denies any discharge from the lesion but thinks it started like a blister. He also reports that his partner told him that he's been having night sweats for the last 2 evenings.  Consultants:  None  Procedures:  None  Antibiotics:  None    Objective: Filed Vitals:   04/27/15 0400 04/27/15 0607 04/27/15 0800 04/27/15 1018  BP:  97/47  116/79  Pulse:  75  90  Temp:  98.4 F (36.9 C)  98.1 F (36.7 C)   TempSrc:  Oral  Oral  Resp: 20 16 17 17   Height:      Weight:      SpO2: 94% 93% 95% 92%   Weight change:   Intake/Output Summary (Last 24 hours) at 04/27/15 1034 Last data filed at 04/27/15 1020  Gross per 24 hour  Intake    480 ml  Output   3270 ml  Net  -2790 ml    General: Alert, awake, oriented x3, in no acute distress.  HEENT: [place]/AT PEERL, EOMI Neck: Trachea midline,  no masses, no thyromegal,y no JVD, no carotid bruit OROPHARYNX:  Moist, No exudate/ erythema/lesions.  Heart: Regular rate and rhythm, without murmurs, rubs, gallops, PMI non-displaced, no heaves or thrills on palpation.  Lungs: Clear to auscultation, no wheezing or rhonchi noted. No increased vocal fremitus resonant to percussion  Abdomen: Soft, nontender, nondistended, positive bowel sounds, no masses no hepatosplenomegaly noted..  Neuro: No focal neurological deficits noted cranial nerves II through XII grossly intact. DTRs 2+ bilaterally upper and lower extremities. Strength 5 out of 5 in bilateral upper and lower extremities. Musculoskeletal: No warm swelling or erythema around joints, no spinal tenderness noted. Psychiatric: Patient alert and oriented x3, good insight and cognition, good recent to remote recall. Lymph node survey: No cervical axillary or inguinal lymphadenopathy noted. Genitalia: Patient has a small lesion on the left scrotum which appears as an unroofed blister. It appears about the head of a tack in size.    Data Reviewed: Basic Metabolic Panel:  Recent Labs Lab 04/24/15 1032 04/26/15 0856 04/26/15 1146 04/27/15 0435  NA 141 140 141 139  K 4.6 5.3* 5.4* 5.1  CL 110 110 110 110  CO2 22 22  --  22  GLUCOSE 96 123* 114* 111*  BUN 13 35* 32* 21*  CREATININE 0.66 1.73* 1.60* 0.88  CALCIUM 9.5 9.0  --  8.9   Liver Function Tests:  Recent Labs Lab 04/24/15 1032 04/26/15 0856 04/27/15 0435  AST 36 32 34  ALT 30 23 22   ALKPHOS 91 91 92  BILITOT 4.2* 3.4* 2.9*  PROT 8.2*  7.8 8.1  ALBUMIN 4.3 4.4 4.8   No results for input(s): LIPASE, AMYLASE in the last 168 hours. No results for input(s): AMMONIA in the last 168 hours. CBC:  Recent Labs Lab 04/24/15 1032 04/26/15 0856 04/26/15 1146 04/27/15 0435  WBC 17.4* 19.4*  --  17.6*  NEUTROABS 13.7*  --   --  11.6*  HGB 7.8* 7.2* 8.2* 6.7*  HCT 22.9* 20.6* 24.0* 19.3*  MCV 87.7 86.9  --  88.1  PLT 602* 563*  --  534*   Cardiac Enzymes:  Recent Labs Lab 04/26/15 0856  TROPONINI 0.08*   BNP (last 3 results)  Recent Labs  08/29/14 0810 09/10/14 0612 11/30/14 0910  BNP 1117.0* 549.5* 639.7*    ProBNP (last 3 results) No results for input(s): PROBNP in the last 8760 hours.  CBG: No results for input(s): GLUCAP in the last 168 hours.  No results found for this or any previous visit (from the past 240 hour(s)).   Studies: Dg Chest 2 View  04/26/2015  CLINICAL DATA:  BILATERAL flank pain for 3 days. Sickle cell crisis. EXAM: CHEST  2 VIEW COMPARISON:  04/24/2015. FINDINGS: Cardiomegaly. Port-A-Cath unchanged, tip in RIGHT atrium. Mild vascular congestion. No infiltrates, or effusion. Sickle cell changes in the visualized osseous structures. IMPRESSION: Cardiomegaly.  No acute abnormality noted. Electronically Signed   By: Staci Righter M.D.   On: 04/26/2015 09:45   Dg Chest 2 View  04/24/2015  CLINICAL DATA:  Sickle cell crisis with chest pain, initial encounter EXAM: CHEST  2 VIEW COMPARISON:  03/19/2015 FINDINGS: Cardiac shadow is enlarged. A left chest wall port is again seen and stable. The lungs are well aerated bilaterally. No acute bony abnormality is seen. IMPRESSION: No acute abnormality noted. Electronically Signed   By: Inez Catalina M.D.   On: 04/24/2015 09:59    Scheduled Meds: . aspirin  81 mg Oral Daily  . cholecalciferol  2,000 Units Oral Daily  . enoxaparin  120 mg Subcutaneous QHS  . folic acid  1 mg Oral q morning - 10a  . gabapentin  300 mg Oral TID  . HYDROmorphone    Intravenous 6 times per day  . hydroxyurea  500 mg Oral Daily  . lisinopril  10 mg Oral Daily  . metoprolol succinate  50 mg Oral Daily   Continuous Infusions: . dextrose 5 % and 0.45% NaCl 125 mL/hr at 04/26/15 2143    Time spent 40 minutes

## 2015-04-27 NOTE — Progress Notes (Signed)
  Echocardiogram 2D Echocardiogram has been performed.  [name] M 04/27/2015, 11:38 AM

## 2015-04-27 NOTE — Telephone Encounter (Signed)
Patient needs referral to pulmonary, due to sickle cell disease and pulmonary hypertension. Patient's appointment for Tuesday will be cancelled and referral will be put in for pulmonary, per Dr. Johnsie Cancel.  Patient is aware that appointment has been cancelled. Patient is aware that pulmonary will be calling him with an appointment.

## 2015-04-28 ENCOUNTER — Ambulatory Visit: Payer: Medicare Other | Admitting: Cardiovascular Disease

## 2015-04-28 DIAGNOSIS — D638 Anemia in other chronic diseases classified elsewhere: Secondary | ICD-10-CM | POA: Diagnosis not present

## 2015-04-28 DIAGNOSIS — D5700 Hb-SS disease with crisis, unspecified: Secondary | ICD-10-CM | POA: Diagnosis not present

## 2015-04-28 DIAGNOSIS — M79606 Pain in leg, unspecified: Secondary | ICD-10-CM | POA: Diagnosis not present

## 2015-04-28 DIAGNOSIS — D72829 Elevated white blood cell count, unspecified: Secondary | ICD-10-CM | POA: Diagnosis not present

## 2015-04-28 LAB — CBC WITH DIFFERENTIAL/PLATELET
Basophils Absolute: 0.2 10*3/uL — ABNORMAL HIGH (ref 0.0–0.1)
Basophils Relative: 1 %
EOS PCT: 3 %
Eosinophils Absolute: 0.6 10*3/uL (ref 0.0–0.7)
HEMATOCRIT: 19.3 % — AB (ref 39.0–52.0)
Hemoglobin: 6.6 g/dL — CL (ref 13.0–17.0)
Lymphocytes Relative: 19 %
Lymphs Abs: 3.6 10*3/uL (ref 0.7–4.0)
MCH: 29.9 pg (ref 26.0–34.0)
MCHC: 34.2 g/dL (ref 30.0–36.0)
MCV: 87.3 fL (ref 78.0–100.0)
MONO ABS: 3.2 10*3/uL — AB (ref 0.1–1.0)
Monocytes Relative: 17 %
NEUTROS PCT: 60 %
Neutro Abs: 11.1 10*3/uL — ABNORMAL HIGH (ref 1.7–7.7)
PLATELETS: 520 10*3/uL — AB (ref 150–400)
RBC: 2.21 MIL/uL — AB (ref 4.22–5.81)
RDW: 21.1 % — AB (ref 11.5–15.5)
WBC: 18.7 10*3/uL — AB (ref 4.0–10.5)

## 2015-04-28 LAB — COMPREHENSIVE METABOLIC PANEL
ALBUMIN: 4.7 g/dL (ref 3.5–5.0)
ALT: 24 U/L (ref 17–63)
ANION GAP: 7 (ref 5–15)
AST: 45 U/L — AB (ref 15–41)
Alkaline Phosphatase: 97 U/L (ref 38–126)
BILIRUBIN TOTAL: 3.9 mg/dL — AB (ref 0.3–1.2)
BUN: 21 mg/dL — ABNORMAL HIGH (ref 6–20)
CHLORIDE: 113 mmol/L — AB (ref 101–111)
CO2: 21 mmol/L — ABNORMAL LOW (ref 22–32)
CREATININE: 0.89 mg/dL (ref 0.61–1.24)
Calcium: 9.3 mg/dL (ref 8.9–10.3)
GFR calc Af Amer: >60 mL/min (ref >60)
GFR calc non Af Amer: >60 mL/min (ref >60)
Glucose, Bld: 139 mg/dL — ABNORMAL HIGH (ref 65–99)
POTASSIUM: 5.1 mmol/L (ref 3.5–5.1)
SODIUM: 141 mmol/L (ref 135–145)
Total Protein: 8.5 g/dL — ABNORMAL HIGH (ref 6.5–8.1)

## 2015-04-28 LAB — RETICULOCYTES
RBC.: 2.21 MIL/uL — AB (ref 4.22–5.81)
RETIC COUNT ABSOLUTE: 161.3 10*3/uL (ref 19.0–186.0)
Retic Ct Pct: 7.3 % — ABNORMAL HIGH (ref 0.4–3.1)

## 2015-04-28 NOTE — Progress Notes (Signed)
SICKLE CELL SERVICE PROGRESS NOTE  [name] [id number] DOB: 31-Aug-1980 DOA: 04/26/2015 PCP: [name], MD  Assessment/Plan: Active Problems:   Sickle cell anemia with crisis ([place])   AKI (acute kidney injury) ([place])   Hyperkalemia   Hypoxia   Leucocytosis  1. Hb SS with crisis: Pt reports that his pain is 7/10 and localized to the ribs. Will increase Toradol to 30 mg since renal function has remained normal. Continue clinician assisted Dilaudid on a  PRN basis.  2. Leukocytosis: Only evidence of infection is possible genital herpetic lesion. Will send Viral PCR. Hold on starting empiric Valacyclovir.  3. Anemia of chronic disease: Hb at baseline. No increase in Hypoxia. Will monitor Hb levels.  4. Chronic Respiratory Failure with Hypoxia and Acute hemolysis: There is a decrease reticulocyte response in the context of the anemia. Likely related to Hydrea. Will hold Hydrea and re-assess reticulocyte response.  5. Chronic pain: Continue MS Contin. 6. 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 Herpes DNA PCR in process.  7. Acute kidney injury: Resolved. Likely secondary to a state of volume depletion.  Code Status: Full Code Family Communication: N/A Disposition Plan: Not yet ready for discharge  [place].  Pager [phone]. If 7PM-7AM, please contact night-coverage.  04/28/2015, 3:54 PM  LOS: 2 days   Interim History:  Pt reports that his pain is 7/10 and localized to the ribs. Last bowel movement yesterday.  Consultants:  None  Procedures:  None  Antibiotics:  None    Objective: Filed Vitals:   04/28/15 1001 04/28/15 1217 04/28/15 1348 04/28/15 1539  BP: 118/76  124/78   Pulse: 82  79   Temp:   98.2 F (36.8 C)   TempSrc:   Oral   Resp:  15 19 13   Height:      Weight:      SpO2:  98% 91% 92%   Weight change: 12.8 oz (0.363 kg)  Intake/Output Summary (Last 24 hours) at 04/28/15 1554 Last data filed at 04/28/15 1444  Gross per 24  hour  Intake   5961 ml  Output   2400 ml  Net   3561 ml    General: Alert, awake, oriented x3, in mild distress secondary to pain.  HEENT: [place]/AT PEERL, EOMI, anicteric Neck: Trachea midline,  no masses, no thyromegal,y no JVD, no carotid bruit OROPHARYNX:  Moist, No exudate/ erythema/lesions.  Heart: Regular rate and rhythm, without murmurs, rubs, gallops, PMI non-displaced, no heaves or thrills on palpation.  Lungs: Clear to auscultation, no wheezing or rhonchi noted. No increased vocal fremitus resonant to percussion  Abdomen: Soft, nontender, nondistended, positive bowel sounds, no masses no hepatosplenomegaly noted.  Neuro: No focal neurological deficits noted cranial nerves II through XII grossly intact.  Strength at functional baseline in bilateral upper and lower extremities. Musculoskeletal: No warmth swelling or erythema around joints, no spinal tenderness noted. Psychiatric: Patient alert and oriented x3, good insight and cognition, good recent to remote recall. Genitalia: Patient has a small lesion on the left scrotum which appears as an unroofed blister. It appears about the head of a tack in size.    Data Reviewed: Basic Metabolic Panel:  Recent Labs Lab 04/24/15 1032 04/26/15 0856 04/26/15 1146 04/27/15 0435 04/28/15 0610  NA 141 140 141 139 141  K 4.6 5.3* 5.4* 5.1 5.1  CL 110 110 110 110 113*  CO2 22 22  --  22 21*  GLUCOSE 96 123* 114* 111* 139*  BUN 13 35* 32* 21*  21*  CREATININE 0.66 1.73* 1.60* 0.88 0.89  CALCIUM 9.5 9.0  --  8.9 9.3   Liver Function Tests:  Recent Labs Lab 04/24/15 1032 04/26/15 0856 04/27/15 0435 04/28/15 0610  AST 36 32 34 45*  ALT 30 23 22 24   ALKPHOS 91 91 92 97  BILITOT 4.2* 3.4* 2.9* 3.9*  PROT 8.2* 7.8 8.1 8.5*  ALBUMIN 4.3 4.4 4.8 4.7   No results for input(s): LIPASE, AMYLASE in the last 168 hours. No results for input(s): AMMONIA in the last 168 hours. CBC:  Recent Labs Lab 04/24/15 1032 04/26/15 0856  04/26/15 1146 04/27/15 0435 04/28/15 0610  WBC 17.4* 19.4*  --  17.6* 18.7*  NEUTROABS 13.7*  --   --  11.6* 11.1*  HGB 7.8* 7.2* 8.2* 6.7* 6.6*  HCT 22.9* 20.6* 24.0* 19.3* 19.3*  MCV 87.7 86.9  --  88.1 87.3  PLT 602* 563*  --  534* 520*   Cardiac Enzymes:  Recent Labs Lab 04/26/15 0856  TROPONINI 0.08*   BNP (last 3 results)  Recent Labs  08/29/14 0810 09/10/14 0612 11/30/14 0910  BNP 1117.0* 549.5* 639.7*    ProBNP (last 3 results) No results for input(s): PROBNP in the last 8760 hours.  CBG: No results for input(s): GLUCAP in the last 168 hours.  Recent Results (from the past 240 hour(s))  Blood culture (routine x 2)     Status: None (Preliminary result)   Collection Time: 04/26/15  9:59 AM  Result Value Ref Range Status   Specimen Description BLOOD PORTA CATH  Final   Special Requests BOTTLES DRAWN AEROBIC AND ANAEROBIC 5 CC EACH  Final   Culture   Final    NO GROWTH 2 DAYS Performed at [facility]    Report Status PENDING  Incomplete  Blood culture (routine x 2)     Status: None (Preliminary result)   Collection Time: 04/26/15  1:20 PM  Result Value Ref Range Status   Specimen Description BLOOD LEFT ARM  Final   Special Requests IN PEDIATRIC BOTTLE 3C  Final   Culture   Final    NO GROWTH 2 DAYS Performed at [facility]    Report Status PENDING  Incomplete     Studies: Dg Chest 2 View  04/26/2015  CLINICAL DATA:  BILATERAL flank pain for 3 days. Sickle cell crisis. EXAM: CHEST  2 VIEW COMPARISON:  04/24/2015. FINDINGS: Cardiomegaly. Port-A-Cath unchanged, tip in RIGHT atrium. Mild vascular congestion. No infiltrates, or effusion. Sickle cell changes in the visualized osseous structures. IMPRESSION: Cardiomegaly.  No acute abnormality noted. Electronically Signed   By: Staci Righter M.D.   On: 04/26/2015 09:45   Dg Chest 2 View  04/24/2015  CLINICAL DATA:  Sickle cell crisis with chest pain, initial encounter EXAM: CHEST  2 VIEW  COMPARISON:  03/19/2015 FINDINGS: Cardiac shadow is enlarged. A left chest wall port is again seen and stable. The lungs are well aerated bilaterally. No acute bony abnormality is seen. IMPRESSION: No acute abnormality noted. Electronically Signed   By: Inez Catalina M.D.   On: 04/24/2015 09:59    Scheduled Meds: . aspirin  81 mg Oral Daily  . cholecalciferol  2,000 Units Oral Daily  . enoxaparin  120 mg Subcutaneous QHS  . folic acid  1 mg Oral q morning - 10a  . gabapentin  300 mg Oral TID  . HYDROmorphone   Intravenous 6 times per day  . hydroxyurea  500 mg Oral Daily  .  ketorolac  15 mg Intravenous 4 times per day  . lisinopril  10 mg Oral Daily  . metoprolol succinate  50 mg Oral Daily  . senna-docusate  1 tablet Oral BID   Continuous Infusions: . dextrose 5 % and 0.45% NaCl 1,000 mL (04/28/15 1541)    Time spent 35 minutes

## 2015-04-29 DIAGNOSIS — N179 Acute kidney failure, unspecified: Secondary | ICD-10-CM | POA: Diagnosis not present

## 2015-04-29 DIAGNOSIS — M79606 Pain in leg, unspecified: Secondary | ICD-10-CM | POA: Diagnosis not present

## 2015-04-29 DIAGNOSIS — D638 Anemia in other chronic diseases classified elsewhere: Secondary | ICD-10-CM | POA: Diagnosis not present

## 2015-04-29 DIAGNOSIS — J9611 Chronic respiratory failure with hypoxia: Secondary | ICD-10-CM | POA: Diagnosis not present

## 2015-04-29 DIAGNOSIS — D5700 Hb-SS disease with crisis, unspecified: Secondary | ICD-10-CM | POA: Diagnosis not present

## 2015-04-29 LAB — CBC WITH DIFFERENTIAL/PLATELET
Basophils Absolute: 0.2 10*3/uL — ABNORMAL HIGH (ref 0.0–0.1)
Basophils Relative: 1 %
EOS PCT: 3 %
Eosinophils Absolute: 0.5 10*3/uL (ref 0.0–0.7)
HCT: 16.9 % — ABNORMAL LOW (ref 39.0–52.0)
Hemoglobin: 5.8 g/dL — CL (ref 13.0–17.0)
Lymphocytes Relative: 22 %
Lymphs Abs: 3.5 10*3/uL (ref 0.7–4.0)
MCH: 29 pg (ref 26.0–34.0)
MCHC: 34.3 g/dL (ref 30.0–36.0)
MCV: 84.5 fL (ref 78.0–100.0)
MONO ABS: 2.4 10*3/uL — AB (ref 0.1–1.0)
Monocytes Relative: 15 %
Neutro Abs: 9.4 10*3/uL — ABNORMAL HIGH (ref 1.7–7.7)
Neutrophils Relative %: 59 %
PLATELETS: 456 10*3/uL — AB (ref 150–400)
RBC: 2 MIL/uL — AB (ref 4.22–5.81)
RDW: 21.1 % — AB (ref 11.5–15.5)
WBC: 16 10*3/uL — AB (ref 4.0–10.5)

## 2015-04-29 LAB — BASIC METABOLIC PANEL
ANION GAP: 6 (ref 5–15)
BUN: 17 mg/dL (ref 6–20)
CALCIUM: 9 mg/dL (ref 8.9–10.3)
CO2: 22 mmol/L (ref 22–32)
CREATININE: 0.71 mg/dL (ref 0.61–1.24)
Chloride: 113 mmol/L — ABNORMAL HIGH (ref 101–111)
GFR calc Af Amer: >60 mL/min (ref >60)
GLUCOSE: 113 mg/dL — AB (ref 65–99)
Potassium: 4.9 mmol/L (ref 3.5–5.1)
Sodium: 141 mmol/L (ref 135–145)

## 2015-04-29 LAB — HERPES SIMPLEX VIRUS(HSV) DNA BY PCR
HSV 1 DNA: NEGATIVE
HSV 2 DNA: NEGATIVE

## 2015-04-29 LAB — PREPARE RBC (CROSSMATCH)

## 2015-04-29 MED ORDER — DIPHENHYDRAMINE HCL 25 MG PO CAPS
25.0000 mg | ORAL_CAPSULE | Freq: Four times a day (QID) | ORAL | Status: DC | PRN
  Administered 2015-04-29: 25 mg via ORAL
  Filled 2015-04-29: qty 1

## 2015-04-29 MED ORDER — SODIUM CHLORIDE 0.9 % IV SOLN
Freq: Once | INTRAVENOUS | Status: AC
  Administered 2015-04-29: 14:00:00 via INTRAVENOUS

## 2015-04-29 MED ORDER — HYDROMORPHONE HCL 4 MG PO TABS
4.0000 mg | ORAL_TABLET | ORAL | Status: DC
  Administered 2015-04-29 – 2015-04-30 (×6): 4 mg via ORAL
  Filled 2015-04-29 (×6): qty 1

## 2015-04-29 MED ORDER — HYDROMORPHONE HCL 2 MG/ML IJ SOLN: 2.0000 mg | INTRAMUSCULAR | Status: DC | PRN

## 2015-04-29 NOTE — Progress Notes (Signed)
Key Points: Use following P&T approved IV to PO non-antibiotic change policy.  Description contains the criteria that are approved Note: Policy Excludes:  Esophagectomy patientsPHARMACIST - PHYSICIAN COMMUNICATION DR:   Zigmund Daniel CONCERNING: IV to Oral Route Change Policy  RECOMMENDATION: This patient is receiving Benadryl by the intravenous route.  Based on criteria approved by the Pharmacy and Therapeutics Committee, the intravenous medication(s) is/are being converted to the equivalent oral dose form(s).   DESCRIPTION: These criteria include:  The patient is eating (either orally or via tube) and/or has been taking other orally administered medications for a least 24 hours  The patient has no evidence of active gastrointestinal bleeding or impaired GI absorption (gastrectomy, short bowel, patient on TNA or NPO).  If you have questions about this conversion, please contact the Pharmacy Department  []   [phone] )  [facility] []   [phone] )  [facility]  []   [phone] )  [facility] [x]   [phone] )  [place], [place], [facility] 04/29/2015 8:22 AM

## 2015-04-29 NOTE — Progress Notes (Signed)
SICKLE CELL SERVICE PROGRESS NOTE  KARREEM CAN [id number] DOB: 10/01/1980 DOA: 04/26/2015 PCP: [name], MD  Assessment/Plan: Active Problems:   Sickle cell anemia with crisis ([place])   AKI (acute kidney injury) ([place])   Hyperkalemia   Hypoxia   Leucocytosis  1. Hb SS with crisis: Pt reports that his pain is 5-6/10 and localized to the ribs. Will  Schedule oral Dilaudid and discontinue clinician assisted doses. ContinueToradol at 30 mg since renal function has remained normal.  2. Anemia of Chronic Disease: Pt had a decrease in Hb and increase in WBC. Currently receiving transfusion of 1 unit RBC's. Will recheck Hb and continue to hold Hydrea. Decision to be made tomorrow about resuming Hydrea.  3. Leukocytosis: Only evidence of infection is possible genital herpetic lesion. Will send Viral PCR. Hold on starting empiric Valacyclovir.  4. Chronic Respiratory Failure with Hypoxia and Acute hemolysis: There is a decrease reticulocyte response in the context of the anemia. Likely related to Hydrea. Will hold Hydrea and re-assess reticulocyte response tomorrow..  5. Chronic pain: Continue MS Contin. 6. 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 Herpes DNA PCR in process.  7. Acute kidney injury: Resolved. Likely secondary to a state of volume depletion.  Code Status: Full Code Family Communication: N/A Disposition Plan: Anticipate discharge tomorrow.  Evertte Sones A.  Pager [phone]. If 7PM-7AM, please contact night-coverage.  04/29/2015, 4:21 PM  LOS: 3 days   Interim History:  Pt reports that his pain is 5-6/10 and localized to the ribs. Last bowel movement yesterday.  Consultants:  None  Procedures:  None  Antibiotics:  None    Objective: Filed Vitals:   04/29/15 0928 04/29/15 1200 04/29/15 1415 04/29/15 1445  BP: 111/68  116/64 112/70  Pulse: 77  80 72  Temp: 99.1 F (37.3 C)  98.4 F (36.9 C) 98.1 F (36.7 C)  TempSrc: Oral  Oral Oral   Resp: 16 18 18 16   Height:      Weight:      SpO2: 94% 92% 92%    Weight change: 1 lb 4.8 oz (0.59 kg)  Intake/Output Summary (Last 24 hours) at 04/29/15 1621 Last data filed at 04/29/15 1500  Gross per 24 hour  Intake   1410 ml  Output   2920 ml  Net  -1510 ml    General: Alert, awake, oriented x3, in no apparent distress. HEENT: [place]/AT PEERL, EOMI, anicteric Neck: Trachea midline,  no masses, no thyromegal,y no JVD, no carotid bruit OROPHARYNX:  Moist, No exudate/ erythema/lesions.  Heart: Regular rate and rhythm, without murmurs, rubs, gallops, PMI non-displaced, no heaves or thrills on palpation.  Lungs: Clear to auscultation, no wheezing or rhonchi noted. No increased vocal fremitus resonant to percussion  Abdomen: Soft, nontender, nondistended, positive bowel sounds, no masses no hepatosplenomegaly noted.  Neuro: No focal neurological deficits noted cranial nerves II through XII grossly intact.  Strength at functional baseline in bilateral upper and lower extremities. Musculoskeletal: No warmth swelling or erythema around joints, no spinal tenderness noted. Genitalia: Patient has a small lesion on the left scrotum which appears as an unroofed blister. It appears about the head of a tack in size.    Data Reviewed: Basic Metabolic Panel:  Recent Labs Lab 04/24/15 1032 04/26/15 0856 04/26/15 1146 04/27/15 0435 04/28/15 0610 04/29/15 0630  NA 141 140 141 139 141 141  K 4.6 5.3* 5.4* 5.1 5.1 4.9  CL 110 110 110 110 113* 113*  CO2 22 22  --  22  21* 22  GLUCOSE 96 123* 114* 111* 139* 113*  BUN 13 35* 32* 21* 21* 17  CREATININE 0.66 1.73* 1.60* 0.88 0.89 0.71  CALCIUM 9.5 9.0  --  8.9 9.3 9.0   Liver Function Tests:  Recent Labs Lab 04/24/15 1032 04/26/15 0856 04/27/15 0435 04/28/15 0610  AST 36 32 34 45*  ALT 30 23 22 24   ALKPHOS 91 91 92 97  BILITOT 4.2* 3.4* 2.9* 3.9*  PROT 8.2* 7.8 8.1 8.5*  ALBUMIN 4.3 4.4 4.8 4.7   No results for input(s): LIPASE,  AMYLASE in the last 168 hours. No results for input(s): AMMONIA in the last 168 hours. CBC:  Recent Labs Lab 04/24/15 1032 04/26/15 0856 04/26/15 1146 04/27/15 0435 04/28/15 0610 04/29/15 0630  WBC 17.4* 19.4*  --  17.6* 18.7* 16.0*  NEUTROABS 13.7*  --   --  11.6* 11.1* 9.4*  HGB 7.8* 7.2* 8.2* 6.7* 6.6* 5.8*  HCT 22.9* 20.6* 24.0* 19.3* 19.3* 16.9*  MCV 87.7 86.9  --  88.1 87.3 84.5  PLT 602* 563*  --  534* 520* 456*   Cardiac Enzymes:  Recent Labs Lab 04/26/15 0856  TROPONINI 0.08*   BNP (last 3 results)  Recent Labs  08/29/14 0810 09/10/14 0612 11/30/14 0910  BNP 1117.0* 549.5* 639.7*    ProBNP (last 3 results) No results for input(s): PROBNP in the last 8760 hours.  CBG: No results for input(s): GLUCAP in the last 168 hours.  Recent Results (from the past 240 hour(s))  Blood culture (routine x 2)     Status: None (Preliminary result)   Collection Time: 04/26/15  9:59 AM  Result Value Ref Range Status   Specimen Description BLOOD PORTA CATH  Final   Special Requests BOTTLES DRAWN AEROBIC AND ANAEROBIC 5 CC EACH  Final   Culture   Final    NO GROWTH 3 DAYS Performed at [facility]    Report Status PENDING  Incomplete  Blood culture (routine x 2)     Status: None (Preliminary result)   Collection Time: 04/26/15  1:20 PM  Result Value Ref Range Status   Specimen Description BLOOD LEFT ARM  Final   Special Requests IN PEDIATRIC BOTTLE 3C  Final   Culture   Final    NO GROWTH 3 DAYS Performed at [facility]    Report Status PENDING  Incomplete     Studies: Dg Chest 2 View  04/26/2015  CLINICAL DATA:  BILATERAL flank pain for 3 days. Sickle cell crisis. EXAM: CHEST  2 VIEW COMPARISON:  04/24/2015. FINDINGS: Cardiomegaly. Port-A-Cath unchanged, tip in RIGHT atrium. Mild vascular congestion. No infiltrates, or effusion. Sickle cell changes in the visualized osseous structures. IMPRESSION: Cardiomegaly.  No acute abnormality noted.  Electronically Signed   By: Staci Righter M.D.   On: 04/26/2015 09:45   Dg Chest 2 View  04/24/2015  CLINICAL DATA:  Sickle cell crisis with chest pain, initial encounter EXAM: CHEST  2 VIEW COMPARISON:  03/19/2015 FINDINGS: Cardiac shadow is enlarged. A left chest wall port is again seen and stable. The lungs are well aerated bilaterally. No acute bony abnormality is seen. IMPRESSION: No acute abnormality noted. Electronically Signed   By: Inez Catalina M.D.   On: 04/24/2015 09:59    Scheduled Meds: . aspirin  81 mg Oral Daily  . cholecalciferol  2,000 Units Oral Daily  . enoxaparin  120 mg Subcutaneous QHS  . folic acid  1 mg Oral q morning - 10a  .  gabapentin  300 mg Oral TID  . HYDROmorphone   Intravenous 6 times per day  . HYDROmorphone  4 mg Oral Q4H  . ketorolac  15 mg Intravenous 4 times per day  . lisinopril  10 mg Oral Daily  . metoprolol succinate  50 mg Oral Daily  . senna-docusate  1 tablet Oral BID   Continuous Infusions: . dextrose 5 % and 0.45% NaCl 75 mL/hr at 04/29/15 0801    Time spent 25 minutes

## 2015-04-29 NOTE — Progress Notes (Signed)
Oral Chemotherapy Policy - Hydrea  Hydroxyurea (Hydrea) hold criteria:  ANC < 2  Pltc < 80K in sickle-cell patients; < 100K in other patients  Hgb <= 6 in sickle-cell patients; < 8 in other patients  Reticulocytes < 80K when Hgb < 9  Labs: Hgb 5.8  A/P: per policy, will discontinue Hydrea due to Hgb < 6. Will follow further labs with you  Adrian Saran, PharmD, BCPS Pager [phone] 04/29/2015 8:19 AM

## 2015-04-29 NOTE — Progress Notes (Signed)
Report from Romie Minus, [place]. Care assumed for pt at this time. Pt sitting in bed, talking on phone, eating pudding. PRBCs infusing via rt chest PAC without difficulty at 15ml/hr. No change noted from AM assessment. Will monitor.

## 2015-04-29 NOTE — Care Management Important Message (Signed)
Important Message  Patient Details  Name: RANDALPH DUFRENE MRN: [id number] Date of Birth: 14-Dec-1980   Medicare Important Message Given:  Yes    [name] 04/29/2015, 8:59 AMImportant Message  Patient Details  Name: CARLTON MACER MRN: [id number] Date of Birth: [birth year]   Medicare Important Message Given:  Yes    [name] 04/29/2015, 8:59 AM

## 2015-04-30 ENCOUNTER — Other Ambulatory Visit: Payer: Self-pay | Admitting: *Deleted

## 2015-04-30 DIAGNOSIS — M79606 Pain in leg, unspecified: Secondary | ICD-10-CM | POA: Diagnosis not present

## 2015-04-30 DIAGNOSIS — D5700 Hb-SS disease with crisis, unspecified: Secondary | ICD-10-CM | POA: Diagnosis not present

## 2015-04-30 LAB — CBC WITH DIFFERENTIAL/PLATELET
Basophils Absolute: 0.2 10*3/uL — ABNORMAL HIGH (ref 0.0–0.1)
Basophils Relative: 1 %
EOS PCT: 3 %
Eosinophils Absolute: 0.5 10*3/uL (ref 0.0–0.7)
HEMATOCRIT: 19.5 % — AB (ref 39.0–52.0)
Hemoglobin: 6.8 g/dL — CL (ref 13.0–17.0)
Lymphocytes Relative: 22 %
Lymphs Abs: 3.4 10*3/uL (ref 0.7–4.0)
MCH: 29.6 pg (ref 26.0–34.0)
MCHC: 34.9 g/dL (ref 30.0–36.0)
MCV: 84.8 fL (ref 78.0–100.0)
MONO ABS: 2.3 10*3/uL — AB (ref 0.1–1.0)
MONOS PCT: 15 %
NEUTROS ABS: 9 10*3/uL — AB (ref 1.7–7.7)
Neutrophils Relative %: 59 %
PLATELETS: 483 10*3/uL — AB (ref 150–400)
RBC: 2.3 MIL/uL — AB (ref 4.22–5.81)
RDW: 20.1 % — AB (ref 11.5–15.5)
WBC: 15.4 10*3/uL — AB (ref 4.0–10.5)

## 2015-04-30 LAB — TYPE AND SCREEN
ABO/RH(D): O POS
Antibody Screen: NEGATIVE
UNIT DIVISION: 0

## 2015-04-30 LAB — RETICULOCYTES
RBC.: 2.3 MIL/uL — ABNORMAL LOW (ref 4.22–5.81)
RETIC COUNT ABSOLUTE: 151.8 10*3/uL (ref 19.0–186.0)
Retic Ct Pct: 6.6 % — ABNORMAL HIGH (ref 0.4–3.1)

## 2015-04-30 MED ORDER — HEPARIN SOD (PORK) LOCK FLUSH 100 UNIT/ML IV SOLN
500.0000 [IU] | Freq: Once | INTRAVENOUS | Status: AC
  Administered 2015-04-30: 500 [IU] via INTRAVENOUS
  Filled 2015-04-30: qty 5

## 2015-04-30 NOTE — Progress Notes (Signed)
Patient discharged to home with friend, discharge instructions reviewed with patient who verbalized understanding. Patient declined wheelchair and walked out on his own.

## 2015-04-30 NOTE — Discharge Summary (Signed)
Physician Discharge Summary  Patient ID: [name] MRN: [id number] DOB/AGE: 09/16/1980 35 y.o.  Admit date: 04/26/2015 Discharge date: 04/30/2015  Admission Diagnoses:  Discharge Diagnoses:  Active Problems:   Sickle cell anemia with crisis ([place])   AKI (acute kidney injury) ([place])   Hyperkalemia   Hypoxia   Leucocytosis   Discharged Condition: good  Hospital Course: Patient admitted with sickle cell painful crisis. Pain was at 9/10 on admission. Treated wityh IV dilaudid PCA, toradol and IVF. At time of Dc, pain was down to 3/10 and he was functional. Dc'd home to follow up with PCP and resume home meds.  Consults: None  Significant Diagnostic Studies: labs: CBCs and CMPs. Was transfused 1 unit PRBC due to drop in Hb.  Treatments: IV hydration and analgesia: Dilaudid  Discharge Exam: Blood pressure 132/77, pulse 67, temperature 97.8 F (36.6 C), temperature source Oral, resp. rate 14, height 6' (1.829 m), weight 78.064 kg (172 lb 1.6 oz), SpO2 92 %. General appearance: alert, cooperative and no distress Back: symmetric, no curvature. ROM normal. No CVA tenderness. Resp: clear to auscultation bilaterally Chest wall: no tenderness Cardio: regular rate and rhythm, S1, S2 normal, no murmur, click, rub or gallop GI: soft, non-tender; bowel sounds normal; no masses,  no organomegaly Pulses: 2+ and symmetric  Disposition: 01-Home or Self Care     Medication List    TAKE these medications        aspirin 81 MG chewable tablet  Chew 1 tablet (81 mg total) by mouth daily.     cyclobenzaprine 5 MG tablet  Commonly known as:  FLEXERIL  Take 1 tablet (5 mg total) by mouth 3 (three) times daily as needed for muscle spasms.     enoxaparin 120 MG/0.8ML injection  Commonly known as:  LOVENOX  Inject 0.8 mLs (120 mg total) into the skin daily.     folic acid 1 MG tablet  Commonly known as:  FOLVITE  Take 1 tablet (1 mg total) by mouth every morning.     gabapentin 300  MG capsule  Commonly known as:  NEURONTIN  Take 1 capsule (300 mg total) by mouth 3 (three) times daily.     HYDROmorphone 4 MG tablet  Commonly known as:  DILAUDID  Take 1 tablet (4 mg total) by mouth every 4 (four) hours as needed for severe pain.     hydroxyurea 500 MG capsule  Commonly known as:  HYDREA  Take 1 capsule (500 mg total) by mouth daily. May take with food to minimize GI side effects.     lisinopril 10 MG tablet  Commonly known as:  PRINIVIL,ZESTRIL  Take 1 tablet (10 mg total) by mouth daily.     metoprolol succinate 50 MG 24 hr tablet  Commonly known as:  TOPROL-XL  Take 1 tablet (50 mg total) by mouth daily.     morphine 30 MG 12 hr tablet  Commonly known as:  MS CONTIN  Take 1 tablet (30 mg total) by mouth every 12 (twelve) hours.     potassium chloride SA 20 MEQ tablet  Commonly known as:  K-DUR,KLOR-CON  Take 1 tablet (20 mEq total) by mouth every morning.     Vitamin D 2000 units tablet  Take 1 tablet (2,000 Units total) by mouth daily.     zolpidem 10 MG tablet  Commonly known as:  AMBIEN  Take 1 tablet (10 mg total) by mouth at bedtime as needed for sleep.  SignedBarbette Merino 04/30/2015, 8:29 AM  Time spent 32 minutes

## 2015-04-30 NOTE — Consult Note (Signed)
   [facility] CM Inpatient Consult   04/30/2015  [name] [birth year] [id number]   Patient evaluated for long-term disease management services with [place] Management program. Martin Majestic to bedside to discuss and offer [place] Management services. Patient is agreeable and written consent signed. Explained to patient that he will receive post hospital transition of care calls and will be evaluated for monthly home visits. Of note, he was active with [facility] Care Management in the past but denied having further needs. Denies having issues with affording medications or with transportation. Confirmed Primary Care MD is with [facility].  Confirmed best contact number as [phone]. Explained that [place] Management will not interfere or replace any other services he may have in place.  Left [facility] Care Management packet and contact information at bedside. Will make inpatient RNCM aware that patient will be followed by [place] Management post hospital discharge. Request patient to be assigned to [place] for readmissions.    Marthenia Rolling, MSN-Ed, RN,BSN [facility] Liaison [phone]

## 2015-05-01 ENCOUNTER — Other Ambulatory Visit: Payer: Self-pay | Admitting: *Deleted

## 2015-05-01 LAB — CULTURE, BLOOD (ROUTINE X 2)
CULTURE: NO GROWTH
Culture: NO GROWTH

## 2015-05-01 NOTE — Patient Outreach (Signed)
[facility]) Care Management [place] Telephone Outreach, Transition of Care, week 1, attempt 1 05/01/2015  [name] 01-19-1981 [id number]  Unsuccessful telephone outreach attempt to [name] is a 35 y/o male, followed by [place] for transition of care after recent IP hospital visit March 24-30, 2017, for Acute Renal Failure secondary to dehydration with sickle cell crisis.  Patient noted to have HTN and pulmonary HTN as well.  HIPPA compliant VM msg left for patient along with my contact details, asking him to return my call.  Will re-attempt call to patient Monday May 04, 2015 if I do not hear back from him before then.   Oneta Rack, RN, BSN, [employer] [facility] Care Management  [phone]

## 2015-05-04 ENCOUNTER — Encounter: Payer: Self-pay | Admitting: *Deleted

## 2015-05-04 ENCOUNTER — Other Ambulatory Visit: Payer: Self-pay | Admitting: *Deleted

## 2015-05-04 ENCOUNTER — Telehealth: Payer: Self-pay

## 2015-05-04 DIAGNOSIS — D571 Sickle-cell disease without crisis: Secondary | ICD-10-CM | POA: Diagnosis not present

## 2015-05-04 MED ORDER — GABAPENTIN 300 MG PO CAPS: 300.0000 mg | ORAL_CAPSULE | Freq: Three times a day (TID) | ORAL | Status: DC

## 2015-05-04 NOTE — Telephone Encounter (Signed)
Refill request for Dilaudid 4mg , MS Contin 30mg . LOV 03/24/2015. Please advise. Thanks!

## 2015-05-04 NOTE — Telephone Encounter (Signed)
Pt called and requested medication refills for the following medications: Dilaudid, 4mg ; MS Contin, 30mg ; Gabapentin, 300mg . Thanks!

## 2015-05-04 NOTE — Patient Outreach (Signed)
[facility]) Care Management  05/04/2015  ASWAD MCMANUS 09/20/1980 [id number]  Successful telephone outreach to Mr. [name], who is followed by [facility] after recent IP hospital visit March 24-30, 2017 for Sickle Cell crisis and ARF.  HIPPA verified.  Today, Mr. Donate reports that he is "doing very good," since he has been home from the hospital, and reports that has had "no problems."  Mr. Alcauter states that he has all his medications and is taking them as prescribed, and confirms that he has a medical provider appointment scheduled for "the end of the month."  Mr. Narasimhan stated that he was unable to talk for very long this morning, and said that he does not feel as if he "needs any help at this time," with his post-discharge care needs or with chronic disease management.  [place] services discussed with him, and he does not wish to schedule a future in- home visit today.  We contracted that I would call him next week to check in on his post-discharge progress.  I made sure he has my contact information should he wish to contact me before our scheduled call next week.  Plan: Mr. Claro will continue taking his medications as prescribed and will keep all provider appointments. [place] will call patient next week to check on him and hopefully schedule an in-home visit for the future.  Oneta Rack, RN, BSN, [employer] [facility] Care Management  [phone]

## 2015-05-05 ENCOUNTER — Other Ambulatory Visit: Payer: Self-pay | Admitting: Family Medicine

## 2015-05-05 ENCOUNTER — Telehealth (HOSPITAL_COMMUNITY): Payer: Self-pay | Admitting: *Deleted

## 2015-05-05 ENCOUNTER — Encounter (HOSPITAL_COMMUNITY): Payer: Self-pay | Admitting: *Deleted

## 2015-05-05 ENCOUNTER — Telehealth: Payer: Self-pay | Admitting: Hematology

## 2015-05-05 ENCOUNTER — Ambulatory Visit (INDEPENDENT_AMBULATORY_CARE_PROVIDER_SITE_OTHER): Payer: Medicare Other | Admitting: Family Medicine

## 2015-05-05 ENCOUNTER — Encounter: Payer: Self-pay | Admitting: Family Medicine

## 2015-05-05 ENCOUNTER — Other Ambulatory Visit: Payer: Self-pay

## 2015-05-05 ENCOUNTER — Non-Acute Institutional Stay (HOSPITAL_COMMUNITY)
Admission: AD | Admit: 2015-05-05 | Discharge: 2015-05-05 | Disposition: A | Discharge status: Home / Self Care | Payer: Medicare Other | Source: Ambulatory Visit | Attending: Internal Medicine | Admitting: Internal Medicine

## 2015-05-05 VITALS — BP 105/69 | HR 86 | Temp 99.0°F | Resp 20 | Ht 72.0 in | Wt 177.0 lb

## 2015-05-05 DIAGNOSIS — G894 Chronic pain syndrome: Secondary | ICD-10-CM

## 2015-05-05 DIAGNOSIS — Z7982 Long term (current) use of aspirin: Secondary | ICD-10-CM | POA: Diagnosis not present

## 2015-05-05 DIAGNOSIS — Z9049 Acquired absence of other specified parts of digestive tract: Secondary | ICD-10-CM | POA: Insufficient documentation

## 2015-05-05 DIAGNOSIS — Z96641 Presence of right artificial hip joint: Secondary | ICD-10-CM | POA: Diagnosis not present

## 2015-05-05 DIAGNOSIS — D5700 Hb-SS disease with crisis, unspecified: Secondary | ICD-10-CM | POA: Diagnosis present

## 2015-05-05 DIAGNOSIS — D473 Essential (hemorrhagic) thrombocythemia: Secondary | ICD-10-CM | POA: Insufficient documentation

## 2015-05-05 DIAGNOSIS — Z86711 Personal history of pulmonary embolism: Secondary | ICD-10-CM | POA: Diagnosis not present

## 2015-05-05 DIAGNOSIS — Z87891 Personal history of nicotine dependence: Secondary | ICD-10-CM | POA: Diagnosis not present

## 2015-05-05 DIAGNOSIS — R221 Localized swelling, mass and lump, neck: Secondary | ICD-10-CM

## 2015-05-05 DIAGNOSIS — I10 Essential (primary) hypertension: Secondary | ICD-10-CM | POA: Diagnosis not present

## 2015-05-05 DIAGNOSIS — D57819 Other sickle-cell disorders with crisis, unspecified: Secondary | ICD-10-CM

## 2015-05-05 DIAGNOSIS — Z9981 Dependence on supplemental oxygen: Secondary | ICD-10-CM

## 2015-05-05 DIAGNOSIS — Z79899 Other long term (current) drug therapy: Secondary | ICD-10-CM | POA: Insufficient documentation

## 2015-05-05 DIAGNOSIS — F1290 Cannabis use, unspecified, uncomplicated: Secondary | ICD-10-CM | POA: Insufficient documentation

## 2015-05-05 DIAGNOSIS — Z7901 Long term (current) use of anticoagulants: Secondary | ICD-10-CM | POA: Diagnosis not present

## 2015-05-05 DIAGNOSIS — Z86718 Personal history of other venous thrombosis and embolism: Secondary | ICD-10-CM | POA: Diagnosis not present

## 2015-05-05 DIAGNOSIS — F39 Unspecified mood [affective] disorder: Secondary | ICD-10-CM | POA: Insufficient documentation

## 2015-05-05 DIAGNOSIS — G4700 Insomnia, unspecified: Secondary | ICD-10-CM

## 2015-05-05 LAB — COMPREHENSIVE METABOLIC PANEL
ALBUMIN: 4.7 g/dL (ref 3.5–5.0)
ALT: 21 U/L (ref 17–63)
AST: 29 U/L (ref 15–41)
Alkaline Phosphatase: 82 U/L (ref 38–126)
Anion gap: 8 (ref 5–15)
BUN: 10 mg/dL (ref 6–20)
CALCIUM: 9.3 mg/dL (ref 8.9–10.3)
CO2: 22 mmol/L (ref 22–32)
CREATININE: 0.75 mg/dL (ref 0.61–1.24)
Chloride: 111 mmol/L (ref 101–111)
GFR calc non Af Amer: >60 mL/min (ref >60)
GLUCOSE: 104 mg/dL — AB (ref 65–99)
Potassium: 4.2 mmol/L (ref 3.5–5.1)
SODIUM: 141 mmol/L (ref 135–145)
Total Bilirubin: 3.7 mg/dL — ABNORMAL HIGH (ref 0.3–1.2)
Total Protein: 8.1 g/dL (ref 6.5–8.1)

## 2015-05-05 LAB — CBC WITH DIFFERENTIAL/PLATELET
BASOS ABS: 0 10*3/uL (ref 0.0–0.1)
BASOS PCT: 0 %
EOS PCT: 3 %
Eosinophils Absolute: 0.5 10*3/uL (ref 0.0–0.7)
HEMATOCRIT: 20.4 % — AB (ref 39.0–52.0)
HEMOGLOBIN: 6.9 g/dL — AB (ref 13.0–17.0)
LYMPHS ABS: 1.6 10*3/uL (ref 0.7–4.0)
Lymphocytes Relative: 9 %
MCH: 29.9 pg (ref 26.0–34.0)
MCHC: 33.8 g/dL (ref 30.0–36.0)
MCV: 88.3 fL (ref 78.0–100.0)
MONOS PCT: 14 %
Monocytes Absolute: 2.5 10*3/uL — ABNORMAL HIGH (ref 0.1–1.0)
NEUTROS PCT: 74 %
Neutro Abs: 13.5 10*3/uL — ABNORMAL HIGH (ref 1.7–7.7)
Platelets: 605 10*3/uL — ABNORMAL HIGH (ref 150–400)
RBC: 2.31 MIL/uL — ABNORMAL LOW (ref 4.22–5.81)
RDW: 21.4 % — ABNORMAL HIGH (ref 11.5–15.5)
WBC: 18.1 10*3/uL — ABNORMAL HIGH (ref 4.0–10.5)
nRBC: 1 /100 WBC — ABNORMAL HIGH

## 2015-05-05 LAB — RETICULOCYTES
RBC.: 2.31 MIL/uL — AB (ref 4.22–5.81)
RETIC COUNT ABSOLUTE: 184.8 10*3/uL (ref 19.0–186.0)
RETIC CT PCT: 8 % — AB (ref 0.4–3.1)

## 2015-05-05 MED ORDER — ONDANSETRON HCL 4 MG/2ML IJ SOLN: 4.0000 mg | Freq: Four times a day (QID) | INTRAMUSCULAR | Status: DC | PRN

## 2015-05-05 MED ORDER — HYDROMORPHONE HCL 4 MG PO TABS: 4.0000 mg | ORAL_TABLET | ORAL | Status: DC | PRN

## 2015-05-05 MED ORDER — DIPHENHYDRAMINE HCL 12.5 MG/5ML PO ELIX
12.5000 mg | ORAL_SOLUTION | Freq: Four times a day (QID) | ORAL | Status: DC | PRN
Start: 2015-05-05 — End: 2015-05-05

## 2015-05-05 MED ORDER — NALOXONE HCL 0.4 MG/ML IJ SOLN: 0.4000 mg | INTRAMUSCULAR | Status: DC | PRN

## 2015-05-05 MED ORDER — HEPARIN SOD (PORK) LOCK FLUSH 100 UNIT/ML IV SOLN
500.0000 [IU] | INTRAVENOUS | Status: AC | PRN
  Administered 2015-05-05: 500 [IU]
  Filled 2015-05-05: qty 5

## 2015-05-05 MED ORDER — SODIUM CHLORIDE 0.9 % IV SOLN
12.5000 mg | Freq: Four times a day (QID) | INTRAVENOUS | Status: DC | PRN
  Administered 2015-05-05: 12.5 mg via INTRAVENOUS
  Filled 2015-05-05 (×3): qty 0.25

## 2015-05-05 MED ORDER — KETOROLAC TROMETHAMINE 30 MG/ML IJ SOLN
30.0000 mg | Freq: Once | INTRAMUSCULAR | Status: AC
  Administered 2015-05-05: 30 mg via INTRAVENOUS
  Filled 2015-05-05: qty 1

## 2015-05-05 MED ORDER — HYDROMORPHONE 1 MG/ML IV SOLN
INTRAVENOUS | Status: DC
  Administered 2015-05-05: 11:00:00 via INTRAVENOUS
  Filled 2015-05-05: qty 25

## 2015-05-05 MED ORDER — SODIUM CHLORIDE 0.9% FLUSH
10.0000 mL | INTRAVENOUS | Status: AC | PRN
  Administered 2015-05-05: 10 mL

## 2015-05-05 MED ORDER — ZOLPIDEM TARTRATE 10 MG PO TABS: 10.0000 mg | ORAL_TABLET | Freq: Every evening | ORAL | Status: DC | PRN

## 2015-05-05 MED ORDER — HYDROMORPHONE HCL 4 MG PO TABS
4.0000 mg | ORAL_TABLET | Freq: Once | ORAL | Status: AC
  Administered 2015-05-05: 4 mg via ORAL
  Filled 2015-05-05: qty 1

## 2015-05-05 MED ORDER — DEXTROSE-NACL 5-0.45 % IV SOLN
INTRAVENOUS | Status: DC
  Administered 2015-05-05: 11:00:00 via INTRAVENOUS

## 2015-05-05 MED ORDER — MORPHINE SULFATE ER 30 MG PO TBCR: 30.0000 mg | EXTENDED_RELEASE_TABLET | Freq: Two times a day (BID) | ORAL | Status: DC

## 2015-05-05 MED ORDER — SODIUM CHLORIDE 0.9% FLUSH: 9.0000 mL | INTRAVENOUS | Status: DC | PRN

## 2015-05-05 NOTE — Discharge Instructions (Signed)
Sickle Cell Anemia, Adult Sickle cell anemia is a condition in which red blood cells have an abnormal "sickle" shape. This abnormal shape shortens the cells' life span, which results in a lower than normal concentration of red blood cells in the blood. The sickle shape also causes the cells to clump together and block free blood flow through the blood vessels. As a result, the tissues and organs of the body do not receive enough oxygen. Sickle cell anemia causes organ damage and pain and increases the risk of infection. CAUSES  Sickle cell anemia is a genetic disorder. Those who receive two copies of the gene have the condition, and those who receive one copy have the trait. RISK FACTORS The sickle cell gene is most common in people whose families originated in [place]. Other areas of the globe where sickle cell trait occurs include the [place], [place] and [place], the [place], and the [place].  SIGNS AND SYMPTOMS  Pain, especially in the extremities, back, chest, or abdomen (common). The pain may start suddenly or may develop following an illness, especially if there is dehydration. Pain can also occur due to overexertion or exposure to extreme temperature changes.  Frequent severe bacterial infections, especially certain types of pneumonia and meningitis.  Pain and swelling in the hands and feet.  Decreased activity.   Loss of appetite.   Change in behavior.  Headaches.  Seizures.  Shortness of breath or difficulty breathing.  Vision changes.  Skin ulcers. Those with the trait may not have symptoms or they may have mild symptoms.  DIAGNOSIS  Sickle cell anemia is diagnosed with blood tests that demonstrate the genetic trait. It is often diagnosed during the newborn period, due to mandatory testing nationwide. A variety of blood tests, X-rays, CT scans, MRI scans, ultrasounds, and lung function tests may also be done to monitor the condition. TREATMENT  Sickle  cell anemia may be treated with:  Medicines. You may be given pain medicines, antibiotic medicines (to treat and prevent infections) or medicines to increase the production of certain types of hemoglobin.  Fluids.  Oxygen.  Blood transfusions. HOME CARE INSTRUCTIONS   Drink enough fluid to keep your urine clear or pale yellow. Increase your fluid intake in hot weather and during exercise.  Do not smoke. Smoking lowers oxygen levels in the blood.   Only take over-the-counter or prescription medicines for pain, fever, or discomfort as directed by your health care provider.  Take antibiotics as directed by your health care provider. Make sure you finish them it even if you start to feel better.   Take supplements as directed by your health care provider.   Consider wearing a medical alert bracelet. This tells anyone caring for you in an emergency of your condition.   When traveling, keep your medical information, health care provider's names, and the medicines you take with you at all times.   If you develop a fever, do not take medicines to reduce the fever right away. This could cover up a problem that is developing. Notify your health care provider.  Keep all follow-up appointments with your health care provider. Sickle cell anemia requires regular medical care. SEEK MEDICAL CARE IF: You have a fever. SEEK IMMEDIATE MEDICAL CARE IF:   You feel dizzy or faint.   You have new abdominal pain, especially on the left side near the stomach area.   You develop a persistent, often uncomfortable and painful penile erection (priapism). If this is not treated immediately it   will lead to impotence.   You have numbness your arms or legs or you have a hard time moving them.   You have a hard time with speech.   You have a fever or persistent symptoms for more than 2-3 days.   You have a fever and your symptoms suddenly get worse.   You have signs or symptoms of infection.  These include:   Chills.   Abnormal tiredness (lethargy).   Irritability.   Poor eating.   Vomiting.   You develop pain that is not helped with medicine.   You develop shortness of breath.  You have pain in your chest.   You are coughing up pus-like or bloody sputum.   You develop a stiff neck.  Your feet or hands swell or have pain.  Your abdomen appears bloated.  You develop joint pain. MAKE SURE YOU:  Understand these instructions.   This information is not intended to replace advice given to you by your health care provider. Make sure you discuss any questions you have with your health care provider.   Document Released: 04/27/2005 Document Revised: 02/07/2014 Document Reviewed: 08/29/2012 Elsevier Interactive Patient Education 2016 [employer]  

## 2015-05-05 NOTE — Discharge Summary (Signed)
[facility] Discharge Summary   Patient ID: [name] MRN: [id number] DOB/AGE: 1980-09-01 35 y.o.  Admit date: 05/05/2015 Discharge date: 05/05/2015  Primary Care Physician:  [name], MD  Admission Diagnoses:  Active Problems:   Hb-SS disease with crisis [facility])  Discharge Medications:    Medication List    TAKE these medications        aspirin 81 MG chewable tablet  Chew 1 tablet (81 mg total) by mouth daily.     cyclobenzaprine 5 MG tablet  Commonly known as:  FLEXERIL  Take 1 tablet (5 mg total) by mouth 3 (three) times daily as needed for muscle spasms.     enoxaparin 120 MG/0.8ML injection  Commonly known as:  LOVENOX  Inject 0.8 mLs (120 mg total) into the skin daily.     folic acid 1 MG tablet  Commonly known as:  FOLVITE  Take 1 tablet (1 mg total) by mouth every morning.     gabapentin 300 MG capsule  Commonly known as:  NEURONTIN  Take 1 capsule (300 mg total) by mouth 3 (three) times daily.     HYDROmorphone 4 MG tablet  Commonly known as:  DILAUDID  Take 1 tablet (4 mg total) by mouth every 4 (four) hours as needed for severe pain.     hydroxyurea 500 MG capsule  Commonly known as:  HYDREA  Take 1 capsule (500 mg total) by mouth daily. May take with food to minimize GI side effects.     lisinopril 10 MG tablet  Commonly known as:  PRINIVIL,ZESTRIL  Take 1 tablet (10 mg total) by mouth daily.     metoprolol succinate 50 MG 24 hr tablet  Commonly known as:  TOPROL-XL  Take 1 tablet (50 mg total) by mouth daily.     morphine 30 MG 12 hr tablet  Commonly known as:  MS CONTIN  Take 1 tablet (30 mg total) by mouth every 12 (twelve) hours.     potassium chloride SA 20 MEQ tablet  Commonly known as:  K-DUR,KLOR-CON  Take 1 tablet (20 mEq total) by mouth every morning.     Vitamin D 2000 units tablet  Take 1 tablet (2,000 Units total) by mouth daily.     zolpidem 10 MG tablet  Commonly known as:  AMBIEN   Take 1 tablet (10 mg total) by mouth at bedtime as needed for sleep.         Consults:  None  Significant Diagnostic Studies:  Dg Chest 2 View  04/26/2015  CLINICAL DATA:  BILATERAL flank pain for 3 days. Sickle cell crisis. EXAM: CHEST  2 VIEW COMPARISON:  04/24/2015. FINDINGS: Cardiomegaly. Port-A-Cath unchanged, tip in RIGHT atrium. Mild vascular congestion. No infiltrates, or effusion. Sickle cell changes in the visualized osseous structures. IMPRESSION: Cardiomegaly.  No acute abnormality noted. Electronically Signed   By: Staci Righter M.D.   On: 04/26/2015 09:45   Dg Chest 2 View  04/24/2015  CLINICAL DATA:  Sickle cell crisis with chest pain, initial encounter EXAM: CHEST  2 VIEW COMPARISON:  03/19/2015 FINDINGS: Cardiac shadow is enlarged. A left chest wall port is again seen and stable. The lungs are well aerated bilaterally. No acute bony abnormality is seen. IMPRESSION: No acute abnormality noted. Electronically Signed   By: Inez Catalina M.D.   On: 04/24/2015 09:59     [facility] Course: [name] was admitted to the sickle cell medical center  for extended observation.  Patient was started on high concentration PCA dilaudid for pain management. He used a total of 12.6 mg with 19 demands and 18 deliveries.  Patient was having 8/10 pain on admission, intensity reduced to 5/10 Patient is alert, oriented, and ambulating. He states that he can manage at home on current medication regimen.   The patient was given clear instructions to go to ER or return to medical center if symptoms do not improve, worsen or new problems develop. The patient verbalized understanding.      Physical Exam at Discharge:    BP 110/51 mmHg  Pulse 76  Temp(Src) 98.4 F (36.9 C) (Oral)  Resp 20  Ht 6' (1.829 m)  Wt 172 lb (78.019 kg)  BMI 23.32 kg/m2  SpO2 97%  General Appearance:    Alert, cooperative, no distress, appears stated age  Head:    Normocephalic,  without obvious abnormality, atraumatic     Lungs:     Clear to auscultation bilaterally, respirations unlabored  Chest wall:    No tenderness or deformity  Heart:    Irregular rate and rhythm, S1 and S2 normal, Murmur, rub   or gallop  Abdomen:     Soft, non-tender, bowel sounds active all four quadrants,    no masses, no organomegaly  Extremities:   Extremities normal, atraumatic, no cyanosis or edema  Pulses:   2+ and symmetric all extremities  Skin:   Skin color, texture, turgor normal, no rashes or lesions   Disposition at Discharge: 01-Home or Self Care  Discharge Orders: Discharge Instructions    Discharge patient    Complete by:  As directed            Condition at Discharge:   Stable  Time spent on Discharge:  15 minutes  Signed: Rashod Gougeon M 05/05/2015, 4:27 PM

## 2015-05-05 NOTE — Telephone Encounter (Signed)
Complaining of right leg and thigh pain. Denies chest pain, shortness of breath, fever, abdominal pain, N/V or diarrhea. Took Dilaudid 4 mg and MS contin 30 mg at 6 am without relief. Will let medical provider know and call patient back. Patient aware.

## 2015-05-05 NOTE — Progress Notes (Signed)
Patient admitted to the [facility] for treatment of 8/10 right leg and rib pain. Treated with IV Toradol, Dilaudid PCA and IV fluids. Pain level down to 5/10 at time of discharge. Went over discharge instructions and gave patient a copy. Discharged to home. Brother picking up. Alert, oriented and ambulatory at discharge.

## 2015-05-05 NOTE — H&P (Signed)
[facility] History and Physical   Date: 05/05/2015  Patient name: [name] Medical record number: [id number] Date of birth: 04/11/1980 Age: 35 y.o. Gender: male PCP: [name], MD  Attending physician: Tresa Garter, MD  Chief Complaint: Right flank and right ankle pain  History of Present Illness: [name], a 35 year old male with a history of sickle cell anemia, HbSS presents complaining of right and left flank pain that is consistent with sickle cell anemia. Patient was transitioned from the primary care office. He maintains that pain started several days ago and has not been controlled on home medication regimen since that time. He attributes current pain crisis to changes in weather and stress.  Current pain intensity is 8/10 described as constant and throbbing. He last had Dilaudid 4 mg around 7 am with minimal relief. He is taking all other medications consistently. He denies headache, chest pains, fatigue, dysuria, nausea, vomiting, or diarrhea.  Meds: Prescriptions prior to admission  Medication Sig Dispense Refill Last Dose  . aspirin 81 MG chewable tablet Chew 1 tablet (81 mg total) by mouth daily. 30 tablet 11 Taking  . Cholecalciferol (VITAMIN D) 2000 units tablet Take 1 tablet (2,000 Units total) by mouth daily. 90 tablet 3 Taking  . cyclobenzaprine (FLEXERIL) 5 MG tablet Take 1 tablet (5 mg total) by mouth 3 (three) times daily as needed for muscle spasms. (Patient not taking: Reported on 05/05/2015) 20 tablet 0 Not Taking  . enoxaparin (LOVENOX) 120 MG/0.8ML injection Inject 0.8 mLs (120 mg total) into the skin daily. 30 Syringe 2 Taking  . folic acid (FOLVITE) 1 MG tablet Take 1 tablet (1 mg total) by mouth every morning. 30 tablet 11 Taking  . gabapentin (NEURONTIN) 300 MG capsule Take 1 capsule (300 mg total) by mouth 3 (three) times daily. 30 capsule 0 Taking  . HYDROmorphone (DILAUDID) 4 MG tablet Take 1 tablet (4 mg  total) by mouth every 4 (four) hours as needed for severe pain. 90 tablet 0 Taking  . hydroxyurea (HYDREA) 500 MG capsule Take 1 capsule (500 mg total) by mouth daily. May take with food to minimize GI side effects. 30 capsule 0 Taking  . lisinopril (PRINIVIL,ZESTRIL) 10 MG tablet Take 1 tablet (10 mg total) by mouth daily. 30 tablet 5 Taking  . metoprolol succinate (TOPROL-XL) 50 MG 24 hr tablet Take 1 tablet (50 mg total) by mouth daily. 30 tablet 3 Taking  . morphine (MS CONTIN) 30 MG 12 hr tablet Take 1 tablet (30 mg total) by mouth every 12 (twelve) hours. 60 tablet 0 Taking  . potassium chloride SA (K-DUR,KLOR-CON) 20 MEQ tablet Take 1 tablet (20 mEq total) by mouth every morning. 30 tablet 3 Taking  . zolpidem (AMBIEN) 10 MG tablet Take 1 tablet (10 mg total) by mouth at bedtime as needed for sleep. 30 tablet 0 Taking    Allergies: Review of patient's allergies indicates no known allergies. Past Medical History  Diagnosis Date  . Sickle cell anemia (HCC)   . Blood transfusion   . Acute embolism and thrombosis of right internal jugular vein ([place])   . Hypokalemia   . Mood disorder ([place])   . History of pulmonary embolus (PE)   . Avascular necrosis (HCC)     Right Hip  . Leukocytosis     Chronic  . Thrombocytosis (HCC)     Chronic  . Hypertension   . History of Clostridium difficile  infection   . Uses marijuana   . Chronic anticoagulation   . Functional asplenia   . Former smoker   . Second hand tobacco smoke exposure   . Alcohol consumption of one to four drinks per day   . Noncompliance with medication regimen   . Sickle-cell crisis with associated acute chest syndrome ([place]) 05/13/2013  . Acute chest syndrome ([place]) 06/18/2013  . Demand ischemia ([place]) 01/02/2014  . Pulmonary hypertension [facility])    Past Surgical History  Procedure Laterality Date  . Right hip replacement      08/2006  . Cholecystectomy      01/2008  . Porta cath placement    . Porta cath removal    .  Umbilical hernia repair      01/2008  . Excision of left periauricular cyst      10/2009  . Excision of right ear lobe cyst with primary closur      11/2007  . Portacath placement  01/05/2012    Procedure: INSERTION PORT-A-CATH;  Surgeon: Odis Hollingshead, MD;  Location: [facility];  Service: General;  Laterality: N/A;  ultrasound guiced port a cath insertion with fluoroscopy   Family History  Problem Relation Age of Onset  . Sickle cell trait Mother   . Depression Mother   . Diabetes Mother   . Sickle cell trait Father   . Sickle cell trait Brother    Social History   Social History  . Marital Status: Single    Spouse Name: N/A  . Number of Children: 0  . Years of Education: 13   Occupational History  . 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 Smoking status: Former Smoker -- 13 years  . Smokeless tobacco: Never Used  . Alcohol Use: No  . Drug Use: 2.00 per week    Special: Marijuana     Comment: Marijuana weekly  . Sexual Activity: Not on file     Comment: month ago   Other Topics Concern  . Not on file   Social History Narrative   Lives in an apartment.  Single.  Lives alone but has a girlfriend that helps care for him.  Does not use any assist devices.        [name]  [phone] Mom, emergency contact      [facility]:   Patient continuing to live in her apartment in town alone. 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    Review of Systems: Eyes: positive for icterus Ears, nose, mouth, throat, and face: negative Respiratory: positive for dyspnea on exertion, negative for emphysema, sputum and wheezing Cardiovascular: negative Gastrointestinal: negative for abdominal pain, constipation and dysphagia Genitourinary:positive for dysuria and frequency Integument/breast: negative Hematologic/lymphatic: negative Musculoskeletal:positive for bone pain and myalgias Neurological:  negative Behavioral/Psych: negative Endocrine: negative  Physical Exam: There were no vitals taken for this visit.   General Appearance:    Alert, cooperative, mild distress, appears stated age  Head:    Normocephalic, without obvious abnormality, atraumatic  Eyes:    PERRL, conjunctiva/corneas clear, EOM's intact, fundi    benign, scleral icterus      Ears:    Normal TM's and external ear canals, both ears  Nose:   Nares normal, septum midline, mucosa normal, no drainage    or sinus tenderness  Throat:   Lips, mucosa, and tongue normal; teeth and gums normal  Neck:  Supple, symmetrical, trachea midline, no adenopathy;       thyroid:  No enlargement/tenderness/nodules; no carotid   bruit or JVD  Back:     Symmetric, no curvature, ROM normal, no CVA tenderness  Lungs:     Clear to auscultation bilaterally, respirations unlabored  Chest wall:    No tenderness or deformity  Heart:    Regular rate and rhythm, S1 and S2 normal, Murmur, rub   or gallop  Extremities:   Extremities normal, atraumatic, no cyanosis or edema  Pulses:   2+ and symmetric all extremities  Skin:   Skin color, texture, turgor normal, no rashes or lesions  Lymph nodes:   Cervical, supraclavicular, and axillary nodes normal  Neurologic:   CNII-XII intact. Normal strength, sensation and reflexes      throughout    Lab results: No results found. However, due to the size of the patient record, not all encounters were searched. Please check Results Review for a complete set of results.  Imaging results:  No results found. Assessment & Plan:  Patient will be admitted to the day infusion center for extended observation  Start IV D5.45 for cellular rehydration at 125/hr  Start Toradol 30 mg IV times 69for inflammation.  Start high concentration dilaudid PCA  Patient will be re-evaluated for pain intensity in the context of function and relationship to   baseline as care progresses.  If no significant pain  relief, will transfer patient to inpatient services for a higher level of care.   Start oxygen via n/c at 2 liters per home dose  Marcin Holte M 05/05/2015, 10:58 AM

## 2015-05-05 NOTE — Telephone Encounter (Signed)
Patient walked up to window stating [facility] needs order for his oxygen. / [name], please fax it to them once [place] writes it. / Thanks

## 2015-05-05 NOTE — Addendum Note (Signed)
Addended by: Dorena Dew on: 05/05/2015 12:22 PM   Modules accepted: Orders

## 2015-05-05 NOTE — Telephone Encounter (Signed)
Returned call to patient regarding pain. Spoke with L. Hollis NP about patient's complaints. Recommended for patient to come to his appointment at 10 am this morning in the office and his pain will be addressed at the appointment.  Explained to [place] and he states "I will try to make it."

## 2015-05-05 NOTE — Progress Notes (Signed)
Subjective:    Patient ID: [name], male    DOB: [birth year], 35 y.o.   MRN: [id number]  HPI  [name], a 35 year old male with a history of sickle cell anemia, HbSS presents for a 1 month follow-up.    Patient has also on chronic anticoagulation therapy due to history of pulmonary embolism. He maintains that he is using Lovenox consistently. [name] reports that he has 8/10 pain primarily to right lower extremity and right rib cage that is consistent with sickle cell anemia. He reports that he last had Dilaudid 4 mg and MS Contin this am with minimal relief.     [name] denies headache, shortness of breath, chest pains, nausea, vomiting, and diarrhea. [name] has a history of pulmonary hypertension.  [name] also has a history of hypoxemia. He is currently on home oxygen therapy. Patient reports that he was issued oxygen tank by [facility]. He states that he will only use oxygen at home. He is still not carrying an oxygen tank around because it is larger than he is willing to carry. Patient denies dyspnea, chest pain, or shortness of breath.   Immunization History  Administered Date(s) Administered  . Influenza Split 01/15/2012  . Influenza,inj,Quad PF,36+ Mos 11/14/2012, 10/28/2013, 10/27/2014  . Pneumococcal Conjugate-13 07/07/2014  . Pneumococcal Polysaccharide-23 01/15/2012  . Tdap 11/21/2012   Past Medical History  Diagnosis Date  . Sickle cell anemia (HCC)   . Blood transfusion   . Acute embolism and thrombosis of right internal jugular vein ([place])   . Hypokalemia   . Mood disorder ([place])   . History of pulmonary embolus (PE)   . Avascular necrosis (HCC)     Right Hip  . Leukocytosis     Chronic  . Thrombocytosis (HCC)     Chronic  . Hypertension   . History of Clostridium difficile infection   . Uses marijuana   . Chronic anticoagulation   . Functional asplenia   . Former smoker   . Second hand tobacco smoke exposure   .  Alcohol consumption of one to four drinks per day   . Noncompliance with medication regimen   . Sickle-cell crisis with associated acute chest syndrome ([place]) 05/13/2013  . Acute chest syndrome ([place]) 06/18/2013  . Demand ischemia ([place]) 01/02/2014  . Pulmonary hypertension ([place])    Social History   Social History  . Marital Status: Single    Spouse Name: N/A  . Number of Children: 0  . Years of Education: 13   Occupational History  . 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 Smoking status: Former Smoker -- 13 years  . Smokeless tobacco: Never Used  . Alcohol Use: No  . Drug Use: 2.00 per week    Special: Marijuana     Comment: Marijuana weekly  . Sexual Activity: Not on file     Comment: month ago   Other Topics Concern  . Not on file   Social History Narrative   Lives in an apartment.  Single.  Lives alone but has a girlfriend that helps care for him.  Does not use any assist devices.        [name]  [phone] Mom, emergency contact      [facility]:   Patient continuing to live in her apartment in town alone. 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  No Known Allergies Review of Systems  Constitutional: Negative for fever, fatigue and unexpected weight change.  HENT: Negative.   Eyes: Negative.   Respiratory: Negative.  Negative for chest tightness and shortness of breath.   Cardiovascular: Negative.  Negative for chest pain, palpitations and leg swelling.  Gastrointestinal: Negative.  Negative for nausea and diarrhea.  Endocrine: Negative.  Negative for polydipsia, polyphagia and polyuria.  Genitourinary: Negative.   Musculoskeletal: Positive for myalgias (lower extremities).  Skin: Negative.   Allergic/Immunologic: Negative.  Negative for immunocompromised state.  Neurological: Negative.  Negative for dizziness and headaches.  Hematological: Negative.   Psychiatric/Behavioral: Negative.         Objective:   Physical Exam  Constitutional: He is oriented to person, place, and time. He appears well-nourished.  HENT:  Head: Normocephalic and atraumatic.  Right Ear: External ear normal.  Left Ear: External ear normal.  Mouth/Throat: Oropharynx is clear and moist.  Eyes: Conjunctivae and EOM are normal. Pupils are equal, round, and reactive to light.  Neck: Normal range of motion. Neck supple. No thyroid mass present.    Cardiovascular: Intact distal pulses and normal pulses.  An irregularly irregular rhythm present.  Pulmonary/Chest: Effort normal and breath sounds normal. No respiratory distress. He has no decreased breath sounds.  Abdominal: Soft. Bowel sounds are normal.  Musculoskeletal: Normal range of motion.  Neurological: He is alert and oriented to person, place, and time. He has normal reflexes.  Skin: Skin is warm and dry.  Psychiatric: He has a normal mood and affect. His behavior is normal. Judgment and thought content normal.      BP 105/69 mmHg  Pulse 86  Temp(Src) 99 F (37.2 C) (Oral)  Resp 20  Ht 6' (1.829 m)  Wt 177 lb (80.287 kg)  BMI 24.00 kg/m2  SpO2 97% Assessment & Plan:  1. Hb-SS disease with crisis [name] states that his pain intensity is 8/10 at present. He last had dilaudid 4 mg and MS Contin around 7 am with minimal relief. Patient will transition to the day infusion center for extended observation and pain management.   Patient will be admitted to the day infusion center for extended observation  Start IV D5.45 for cellular rehydration at 125/hr  Start Toradol 30 mg IV every 6 hours for inflammation.  Start Dilaudid PCA High Concentration per weight based protocol.   Patient will be re-evaluated for pain intensity in the context of function and relationship to baseline as care progresses.  If no significant pain relief, will transfer patient to inpatient services for a higher level of care.   Will check CMP,  LDH and CBC  w/differential  Will start Patient currently on 1500 mg of hydrea.  Reviewed most recent CBC w/ differential. ANC >2, platelet count > 80K, and hemoglobin is >6.  Reviewed recent CBC w/differential and reticulocyte. Patient was evaluated by hematology on 05/04/2015. Will review notes as they become available. Patient did not follow up with hematologist as scheduled. We discussed the need for good hydration, monitoring of hydration status, avoidance of heat, cold, stress, and infection triggers. We discussed the risks and benefits of Hydrea, including bone marrow suppression, the possibility of GI upset, skin ulcers, hair thinning, and teratogenicity. The patient was reminded of the need to seek medical attention of any symptoms of bleeding, anemia, or infection. Continue folic acid 1 mg daily to prevent aplastic bone marrow crises.    Pulmonary evaluation -  Patient was discharged with 3 liters of oxygen  during previous hospital visit. He states that he will not wear oxygen in public. Patient denies severe recurrent wheezes, shortness of breath with exercise, or persistent cough. Oxygen saturation is 95% without oxygen.   Cardiac - Abnormal EKG and echocardiogram. Patient has a history of pulmonary hypertension. He has been referred to cardiology previously. Our office called to schedule an appointment.   Acute and chronic painful episodes - We agreed on continuing MS contin 30 mg every 12 hours.  Will also continue Dilaudid 4 mg every 4 hours as needed for moderate to severe pain. Patient is currently at 156 milli equivalents per day.  We discussed that pt is to receive his Schedule II prescriptions only from [place]. Pt is also aware that the prescription history is available to [place] online through the [facility] CSRS. Controlled substance agreement signed previously. We reminded [name]  that all patients receiving Schedule II narcotics must be seen for follow within one month of prescription being requested. We  reviewed the terms of our pain agreement, including the need to keep medicines in a safe locked location away from children or pets, and the need to report excess sedation or constipation, measures to avoid constipation, and policies related to early refills and stolen prescriptions. According to the [place] [facility] program, we have reviewed details related to analgesia, adverse effects, aberrant behaviors. Reviewed [place] Substance Reporting system prior to prescribing opiate medications, no inconsistencies noted.   2. Neck nodule 0.5 cm nodule palpated/left neck, round, raised, movable, non tender to palpation.  - US Soft Tissue Head/Neck; Future  3. Hx of pulmonary embolus . He maintains that he has been using Lovenox consistently. Discussed side effects; specifically signs of bleeding. He expressed understanding.  4. On home oxygen therapy Will continue on 3L of oxygen as previously prescribed.    5. Chronic pain syndrome - morphine (MS CONTIN) 30 MG 12 hr tablet; Take 1 tablet (30 mg total) by mouth every 12 (twelve) hours.  Dispense: 60 tablet; Refill: 0 - HYDROmorphone (DILAUDID) 4 MG tablet; Take 1 tablet (4 mg total) by mouth every 4 (four) hours as needed for severe pain.  Dispense: 90 tablet; Refill: 0  6. Insomnia - zolpidem (AMBIEN) 10 MG tablet; Take 1 tablet (10 mg total) by mouth at bedtime as needed for sleep.  Dispense: 30 tablet; Refill: 0      RTC: 1 month for sickle cell anemia and medication management The patient was given clear instructions to go to ER or return to medical center if symptoms do not improve, worsen or new problems develop. The patient verbalized understanding. Will notify patient with laboratory results. Dorena Dew, FNP

## 2015-05-05 NOTE — Progress Notes (Signed)
CRITICAL VALUE ALERT  Critical value received:  Hgb  6.9  Date of notification:  05/05/15  Time of notification:  [id number]  Critical value read back:Yes.    Nurse who received alert:  Leafy Half, RN  MD notified (1st page):  C.Smith Robert, NP  Time of first page:  1135am  MD notified (2nd page):  Time of second page:  Responding MD:  C.Hollis, NP  Time MD responded:  1135am

## 2015-05-05 NOTE — Progress Notes (Signed)
Patient is here for FU  Patient complains of pain on right side rib cage. Patient complains of leg pain. Pain is scaled currently at a 7.  Patient has only taken dilaudid today.

## 2015-05-07 ENCOUNTER — Non-Acute Institutional Stay (EMERGENCY_DEPARTMENT_HOSPITAL)
Admission: AD | Admit: 2015-05-07 | Discharge: 2015-05-07 | Disposition: A | Discharge status: Home / Self Care | Payer: Medicare Other | Source: Ambulatory Visit | Attending: Internal Medicine | Admitting: Internal Medicine

## 2015-05-07 ENCOUNTER — Encounter (HOSPITAL_COMMUNITY): Payer: Self-pay | Admitting: Emergency Medicine

## 2015-05-07 ENCOUNTER — Encounter (HOSPITAL_COMMUNITY): Payer: Self-pay | Admitting: *Deleted

## 2015-05-07 ENCOUNTER — Emergency Department (HOSPITAL_COMMUNITY)
Admission: EM | Admit: 2015-05-07 | Discharge: 2015-05-07 | Disposition: A | Discharge status: Home / Self Care | Payer: Medicare Other | Source: Home / Self Care | Attending: Emergency Medicine | Admitting: Emergency Medicine

## 2015-05-07 ENCOUNTER — Emergency Department (HOSPITAL_COMMUNITY): Payer: Medicare Other

## 2015-05-07 DIAGNOSIS — Z96641 Presence of right artificial hip joint: Secondary | ICD-10-CM | POA: Diagnosis not present

## 2015-05-07 DIAGNOSIS — D72829 Elevated white blood cell count, unspecified: Secondary | ICD-10-CM | POA: Diagnosis not present

## 2015-05-07 DIAGNOSIS — Z87891 Personal history of nicotine dependence: Secondary | ICD-10-CM

## 2015-05-07 DIAGNOSIS — D5700 Hb-SS disease with crisis, unspecified: Principal | ICD-10-CM | POA: Diagnosis present

## 2015-05-07 DIAGNOSIS — Z7901 Long term (current) use of anticoagulants: Secondary | ICD-10-CM

## 2015-05-07 DIAGNOSIS — D571 Sickle-cell disease without crisis: Secondary | ICD-10-CM | POA: Diagnosis not present

## 2015-05-07 DIAGNOSIS — R079 Chest pain, unspecified: Secondary | ICD-10-CM | POA: Diagnosis not present

## 2015-05-07 DIAGNOSIS — E876 Hypokalemia: Secondary | ICD-10-CM | POA: Insufficient documentation

## 2015-05-07 DIAGNOSIS — Z7982 Long term (current) use of aspirin: Secondary | ICD-10-CM | POA: Insufficient documentation

## 2015-05-07 DIAGNOSIS — Z79899 Other long term (current) drug therapy: Secondary | ICD-10-CM | POA: Insufficient documentation

## 2015-05-07 DIAGNOSIS — Q8901 Asplenia (congenital): Secondary | ICD-10-CM | POA: Insufficient documentation

## 2015-05-07 DIAGNOSIS — G8929 Other chronic pain: Secondary | ICD-10-CM | POA: Diagnosis present

## 2015-05-07 DIAGNOSIS — J9611 Chronic respiratory failure with hypoxia: Secondary | ICD-10-CM | POA: Diagnosis not present

## 2015-05-07 DIAGNOSIS — Z86711 Personal history of pulmonary embolism: Secondary | ICD-10-CM

## 2015-05-07 DIAGNOSIS — Z86718 Personal history of other venous thrombosis and embolism: Secondary | ICD-10-CM

## 2015-05-07 DIAGNOSIS — I2781 Cor pulmonale (chronic): Secondary | ICD-10-CM | POA: Diagnosis present

## 2015-05-07 DIAGNOSIS — I10 Essential (primary) hypertension: Secondary | ICD-10-CM | POA: Insufficient documentation

## 2015-05-07 DIAGNOSIS — Z8619 Personal history of other infectious and parasitic diseases: Secondary | ICD-10-CM

## 2015-05-07 DIAGNOSIS — Z9114 Patient's other noncompliance with medication regimen: Secondary | ICD-10-CM | POA: Insufficient documentation

## 2015-05-07 HISTORY — DX: Hb-SS disease with crisis, unspecified: D57.00

## 2015-05-07 LAB — CBC WITH DIFFERENTIAL/PLATELET
BASOS PCT: 0 %
Basophils Absolute: 0 10*3/uL (ref 0.0–0.1)
EOS ABS: 0.4 10*3/uL (ref 0.0–0.7)
Eosinophils Relative: 2 %
HEMATOCRIT: 20 % — AB (ref 39.0–52.0)
Hemoglobin: 6.8 g/dL — CL (ref 13.0–17.0)
LYMPHS ABS: 2.5 10*3/uL (ref 0.7–4.0)
LYMPHS PCT: 14 %
MCH: 29.8 pg (ref 26.0–34.0)
MCHC: 34 g/dL (ref 30.0–36.0)
MCV: 87.7 fL (ref 78.0–100.0)
MONO ABS: 2.1 10*3/uL — AB (ref 0.1–1.0)
Monocytes Relative: 12 %
NEUTROS PCT: 72 %
NRBC: 1 /100{WBCs} — AB
Neutro Abs: 12.7 10*3/uL — ABNORMAL HIGH (ref 1.7–7.7)
Platelets: 572 10*3/uL — ABNORMAL HIGH (ref 150–400)
RBC: 2.28 MIL/uL — ABNORMAL LOW (ref 4.22–5.81)
RDW: 22.3 % — AB (ref 11.5–15.5)
WBC: 17.7 10*3/uL — ABNORMAL HIGH (ref 4.0–10.5)

## 2015-05-07 LAB — COMPREHENSIVE METABOLIC PANEL
ALT: 20 U/L (ref 17–63)
ANION GAP: 8 (ref 5–15)
AST: 31 U/L (ref 15–41)
Albumin: 4.3 g/dL (ref 3.5–5.0)
Alkaline Phosphatase: 76 U/L (ref 38–126)
BILIRUBIN TOTAL: 4.2 mg/dL — AB (ref 0.3–1.2)
BUN: 8 mg/dL (ref 6–20)
CO2: 23 mmol/L (ref 22–32)
Calcium: 9.3 mg/dL (ref 8.9–10.3)
Chloride: 111 mmol/L (ref 101–111)
Creatinine, Ser: 0.67 mg/dL (ref 0.61–1.24)
Glucose, Bld: 111 mg/dL — ABNORMAL HIGH (ref 65–99)
POTASSIUM: 4 mmol/L (ref 3.5–5.1)
Sodium: 142 mmol/L (ref 135–145)
TOTAL PROTEIN: 7.7 g/dL (ref 6.5–8.1)

## 2015-05-07 LAB — URINALYSIS, ROUTINE W REFLEX MICROSCOPIC
Bilirubin Urine: NEGATIVE
Glucose, UA: NEGATIVE mg/dL
HGB URINE DIPSTICK: NEGATIVE
Ketones, ur: NEGATIVE mg/dL
LEUKOCYTES UA: NEGATIVE
NITRITE: NEGATIVE
PROTEIN: NEGATIVE mg/dL
Specific Gravity, Urine: 1.012 (ref 1.005–1.030)
pH: 7 (ref 5.0–8.0)

## 2015-05-07 LAB — APTT: aPTT: 106 seconds — ABNORMAL HIGH (ref 24–37)

## 2015-05-07 LAB — PROTIME-INR
INR: 1.32 (ref 0.00–1.49)
PROTHROMBIN TIME: 16 s — AB (ref 11.6–15.2)

## 2015-05-07 LAB — RETICULOCYTES
RBC.: 2.28 MIL/uL — AB (ref 4.22–5.81)
RETIC CT PCT: 10.8 % — AB (ref 0.4–3.1)
Retic Count, Absolute: 246.2 10*3/uL — ABNORMAL HIGH (ref 19.0–186.0)

## 2015-05-07 MED ORDER — ONDANSETRON HCL 4 MG/2ML IJ SOLN: 4.0000 mg | Freq: Four times a day (QID) | INTRAMUSCULAR | Status: DC | PRN

## 2015-05-07 MED ORDER — DIPHENHYDRAMINE HCL 50 MG/ML IJ SOLN
25.0000 mg | Freq: Once | INTRAMUSCULAR | Status: AC
  Administered 2015-05-07: 25 mg via INTRAVENOUS
  Filled 2015-05-07: qty 1

## 2015-05-07 MED ORDER — HYDROMORPHONE 1 MG/ML IV SOLN
INTRAVENOUS | Status: DC
  Administered 2015-05-07: 12:00:00 via INTRAVENOUS
  Administered 2015-05-07: 14.4 mg via INTRAVENOUS
  Filled 2015-05-07: qty 25

## 2015-05-07 MED ORDER — SENNOSIDES-DOCUSATE SODIUM 8.6-50 MG PO TABS: 1.0000 | ORAL_TABLET | Freq: Two times a day (BID) | ORAL | Status: DC

## 2015-05-07 MED ORDER — HYDROMORPHONE HCL 2 MG/ML IJ SOLN
0.0313 mg/kg | INTRAMUSCULAR | Status: AC
Start: 2015-05-07 — End: 2015-05-07
  Administered 2015-05-07: 2.4 mg via INTRAVENOUS
  Filled 2015-05-07: qty 2

## 2015-05-07 MED ORDER — HEPARIN SOD (PORK) LOCK FLUSH 100 UNIT/ML IV SOLN: 500.0000 [IU] | Freq: Once | INTRAVENOUS | Status: DC

## 2015-05-07 MED ORDER — FOLIC ACID 1 MG PO TABS
1.0000 mg | ORAL_TABLET | Freq: Every day | ORAL | Status: DC
  Administered 2015-05-07: 1 mg via ORAL
  Filled 2015-05-07: qty 1

## 2015-05-07 MED ORDER — HYDROMORPHONE HCL 2 MG/ML IJ SOLN
0.0250 mg/kg | INTRAMUSCULAR | Status: AC
  Administered 2015-05-07: 2 mg via INTRAVENOUS
  Filled 2015-05-07: qty 1

## 2015-05-07 MED ORDER — DEXTROSE-NACL 5-0.45 % IV SOLN
INTRAVENOUS | Status: DC
  Administered 2015-05-07: 12:00:00 via INTRAVENOUS

## 2015-05-07 MED ORDER — SODIUM CHLORIDE 0.45 % IV SOLN
INTRAVENOUS | Status: DC
  Administered 2015-05-07: 10:00:00 via INTRAVENOUS

## 2015-05-07 MED ORDER — HYDROMORPHONE HCL 2 MG/ML IJ SOLN: 0.0250 mg/kg | INTRAMUSCULAR | Status: AC

## 2015-05-07 MED ORDER — SODIUM CHLORIDE 0.9 % IV SOLN
12.5000 mg | Freq: Four times a day (QID) | INTRAVENOUS | Status: DC | PRN
  Administered 2015-05-07: 12.5 mg via INTRAVENOUS
  Filled 2015-05-07 (×3): qty 0.25

## 2015-05-07 MED ORDER — SODIUM CHLORIDE 0.9% FLUSH
10.0000 mL | INTRAVENOUS | Status: AC | PRN
  Administered 2015-05-07: 10 mL

## 2015-05-07 MED ORDER — KETOROLAC TROMETHAMINE 30 MG/ML IJ SOLN
30.0000 mg | INTRAMUSCULAR | Status: AC
  Administered 2015-05-07: 30 mg via INTRAVENOUS
  Filled 2015-05-07: qty 1

## 2015-05-07 MED ORDER — DIPHENHYDRAMINE HCL 12.5 MG/5ML PO ELIX: 12.5000 mg | ORAL_SOLUTION | Freq: Four times a day (QID) | ORAL | Status: DC | PRN

## 2015-05-07 MED ORDER — KETOROLAC TROMETHAMINE 30 MG/ML IJ SOLN
30.0000 mg | Freq: Four times a day (QID) | INTRAMUSCULAR | Status: DC
  Administered 2015-05-07: 30 mg via INTRAVENOUS
  Filled 2015-05-07: qty 1

## 2015-05-07 MED ORDER — HEPARIN SOD (PORK) LOCK FLUSH 100 UNIT/ML IV SOLN
500.0000 [IU] | INTRAVENOUS | Status: AC | PRN
  Administered 2015-05-07: 500 [IU]
  Filled 2015-05-07: qty 5

## 2015-05-07 MED ORDER — POLYETHYLENE GLYCOL 3350 17 G PO PACK: 17.0000 g | PACK | Freq: Every day | ORAL | Status: DC | PRN

## 2015-05-07 MED ORDER — HYDROMORPHONE HCL 2 MG/ML IJ SOLN: 0.0313 mg/kg | INTRAMUSCULAR | Status: AC

## 2015-05-07 MED ORDER — NALOXONE HCL 0.4 MG/ML IJ SOLN: 0.4000 mg | INTRAMUSCULAR | Status: DC | PRN

## 2015-05-07 MED ORDER — SODIUM CHLORIDE 0.9% FLUSH: 9.0000 mL | INTRAVENOUS | Status: DC | PRN

## 2015-05-07 NOTE — ED Notes (Signed)
Per pt, states side pain and lower back pain

## 2015-05-07 NOTE — Discharge Summary (Signed)
Physician Discharge Summary  [name] [id number] DOB: [birth year] DOA: 05/07/2015  PCP: [name], MD  Admit date: 05/07/2015  Discharge date: 05/07/2015  Time spent: 30 minutes  Discharge Diagnoses:  Active Problems:   Sickle cell anemia with pain [facility])   Discharge Condition: Stable  Diet recommendation: Regular  Filed Weights   05/07/15 1155  Weight: 172 lb (78.019 kg)    History of present illness:  [name] is a 35 y.o. male with history of sickle cell anemia, prior jugular vein embolism on chronic anticoagulation, hypertension, acute chest syndrome, high ED and inpatient admission utilizer for sickle cell disease related pain and chronic pain syndrome, came to the hospital today with major complaints of pain in both upper and lower limbs as well as the lower back which has been ongoing for about 2 days. Patient was in the ED this morning for the same complaint and was sent here for extended observation and management of sickle cell pain. Patient denies any history of fever or chills, no nausea or vomiting, no urinary symptoms, no change in bowel habits, no chest pain, no shortness of breath, no abdominal pain. In the emergency department, patient appeared to be nontoxic, no distress no significant lab abnormalities different from previous results noted.  Hospital Course:   [name] was admitted to the day hospital with sickle cell painful crisis. Patient was treated with weight based IV Dilaudid PCA, IV Toradol as well as IV fluids. [place] showed some improvement symptomatically, pain went down from 8 to 5 out of 10 at the time of discharge. Galvin will follow-up in the clinic as previously scheduled, continue with home medications as per prior to admission.Patient was again counseled extensively about his disease, he was reminded of the need to use oxygen especially at night throughout thyroid hypoxia and sickling of RBC. Patient was also  encouraged to take his medications as prescribed especially hydroxyurea and folic acid instead of focusing more on pain medications.  Discharge Exam: Filed Vitals:   05/07/15 1421 05/07/15 1525  BP: 107/69 114/73  Pulse: 77 80  Temp:    Resp: 20 20    General appearance: alert, cooperative and no distress Eyes: conjunctivae/corneas clear. PERRL, EOM's intact. Fundi benign. Neck: no adenopathy, no carotid bruit, no JVD, supple, symmetrical, trachea midline and thyroid not enlarged, symmetric, no tenderness/mass/nodules Back: symmetric, no curvature. ROM normal. No CVA tenderness. Resp: clear to auscultation bilaterally Chest wall: no tenderness Cardio: regular rate and rhythm, S1, S2 normal, no murmur, click, rub or gallop GI: soft, non-tender; bowel sounds normal; no masses, no organomegaly Extremities: extremities normal, atraumatic, no cyanosis or edema Pulses: 2+ and symmetric Skin: Skin color, texture, turgor normal. No rashes or lesions Neurologic: Grossly normal  Discharge Instructions We discussed the need for good hydration, monitoring of hydration status, avoidance of heat, cold, stress, and infection triggers. We discussed the need to be compliant with taking Hydrea. Cylan was reminded of the need to seek medical attention of any symptoms of bleeding, anemia, or infection occurs.   Current Discharge Medication List    CONTINUE these medications which have NOT CHANGED   Details  aspirin 81 MG chewable tablet Chew 1 tablet (81 mg total) by mouth daily. Qty: 30 tablet, Refills: 11    Cholecalciferol (VITAMIN D) 2000 units tablet Take 1 tablet (2,000 Units total) by mouth daily. Qty: 90 tablet, Refills: 3   Associated Diagnoses: Vitamin D deficiency    enoxaparin (LOVENOX) 120 MG/0.8ML injection  Inject 0.8 mLs (120 mg total) into the skin daily. Qty: 30 Syringe, Refills: 2    folic acid (FOLVITE) 1 MG tablet Take 1 tablet (1 mg total) by mouth every morning. Qty:  30 tablet, Refills: 11   Associated Diagnoses: Hb-SS disease without crisis (HCC)    HYDROmorphone (DILAUDID) 4 MG tablet Take 1 tablet (4 mg total) by mouth every 4 (four) hours as needed for severe pain. Qty: 90 tablet, Refills: 0   Associated Diagnoses: Sickle-cell disease with pain ([place]); Chronic pain syndrome    hydroxyurea (HYDREA) 500 MG capsule Take 1 capsule (500 mg total) by mouth daily. May take with food to minimize GI side effects. Qty: 30 capsule, Refills: 0   Associated Diagnoses: Hb-SS disease without crisis ([place])    lisinopril (PRINIVIL,ZESTRIL) 10 MG tablet Take 1 tablet (10 mg total) by mouth daily. Qty: 30 tablet, Refills: 5   Associated Diagnoses: Hb-SS disease without crisis ([place]); Essential hypertension    metoprolol succinate (TOPROL-XL) 50 MG 24 hr tablet Take 1 tablet (50 mg total) by mouth daily. Qty: 30 tablet, Refills: 3   Associated Diagnoses: Chronic atrial fibrillation ([place]); Pulmonary HTN (HCC)    morphine (MS CONTIN) 30 MG 12 hr tablet Take 1 tablet (30 mg total) by mouth every 12 (twelve) hours. Qty: 60 tablet, Refills: 0   Associated Diagnoses: Chronic pain syndrome    potassium chloride SA (K-DUR,KLOR-CON) 20 MEQ tablet Take 1 tablet (20 mEq total) by mouth every morning. Qty: 30 tablet, Refills: 3   Associated Diagnoses: Pulmonary HTN (HCC)    zolpidem (AMBIEN) 10 MG tablet Take 1 tablet (10 mg total) by mouth at bedtime as needed for sleep. Qty: 30 tablet, Refills: 0   Associated Diagnoses: Insomnia    cyclobenzaprine (FLEXERIL) 5 MG tablet Take 1 tablet (5 mg total) by mouth 3 (three) times daily as needed for muscle spasms. Qty: 20 tablet, Refills: 0    gabapentin (NEURONTIN) 300 MG capsule Take 1 capsule (300 mg total) by mouth 3 (three) times daily. Qty: 30 capsule, Refills: 0       No Known Allergies   Significant Diagnostic Studies: Dg Chest 2 View  05/07/2015  CLINICAL DATA:  Sickle cell pain crisis, chest pain EXAM: CHEST  2  VIEW COMPARISON:  04/26/2015 FINDINGS: Left subclavian power port catheter tip SVC RA junction. Stable cardiomegaly with vascular congestion and basilar atelectasis/ parenchymal scarring. No superimposed pneumonia, collapse or consolidation. No edema, effusion or pneumothorax. Trachea midline. IMPRESSION: Stable cardiomegaly with vascular congestion and parenchymal scarring. No superimposed acute process. Electronically Signed   By: Jerilynn Mages.  Shick M.D.   On: 05/07/2015 09:05   Dg Chest 2 View  04/26/2015  CLINICAL DATA:  BILATERAL flank pain for 3 days. Sickle cell crisis. EXAM: CHEST  2 VIEW COMPARISON:  04/24/2015. FINDINGS: Cardiomegaly. Port-A-Cath unchanged, tip in RIGHT atrium. Mild vascular congestion. No infiltrates, or effusion. Sickle cell changes in the visualized osseous structures. IMPRESSION: Cardiomegaly.  No acute abnormality noted. Electronically Signed   By: Staci Righter M.D.   On: 04/26/2015 09:45   Dg Chest 2 View  04/24/2015  CLINICAL DATA:  Sickle cell crisis with chest pain, initial encounter EXAM: CHEST  2 VIEW COMPARISON:  03/19/2015 FINDINGS: Cardiac shadow is enlarged. A left chest wall port is again seen and stable. The lungs are well aerated bilaterally. No acute bony abnormality is seen. IMPRESSION: No acute abnormality noted. Electronically Signed   By: Inez Catalina M.D.   On: 04/24/2015 09:59  Signed:  [name] MD, [place], [place], [place], [place]   05/07/2015, 4:22 PM

## 2015-05-07 NOTE — Discharge Instructions (Signed)
Sickle Cell Anemia, Adult Sickle cell anemia is a condition in which red blood cells have an abnormal "sickle" shape. This abnormal shape shortens the cells' life span, which results in a lower than normal concentration of red blood cells in the blood. The sickle shape also causes the cells to clump together and block free blood flow through the blood vessels. As a result, the tissues and organs of the body do not receive enough oxygen. Sickle cell anemia causes organ damage and pain and increases the risk of infection. CAUSES  Sickle cell anemia is a genetic disorder. Those who receive two copies of the gene have the condition, and those who receive one copy have the trait. RISK FACTORS The sickle cell gene is most common in people whose families originated in [place]. Other areas of the globe where sickle cell trait occurs include the [place], [place] and [place], the [place], and the [place].  SIGNS AND SYMPTOMS  Pain, especially in the extremities, back, chest, or abdomen (common). The pain may start suddenly or may develop following an illness, especially if there is dehydration. Pain can also occur due to overexertion or exposure to extreme temperature changes.  Frequent severe bacterial infections, especially certain types of pneumonia and meningitis.  Pain and swelling in the hands and feet.  Decreased activity.   Loss of appetite.   Change in behavior.  Headaches.  Seizures.  Shortness of breath or difficulty breathing.  Vision changes.  Skin ulcers. Those with the trait may not have symptoms or they may have mild symptoms.  DIAGNOSIS  Sickle cell anemia is diagnosed with blood tests that demonstrate the genetic trait. It is often diagnosed during the newborn period, due to mandatory testing nationwide. A variety of blood tests, X-rays, CT scans, MRI scans, ultrasounds, and lung function tests may also be done to monitor the condition. TREATMENT  Sickle  cell anemia may be treated with:  Medicines. You may be given pain medicines, antibiotic medicines (to treat and prevent infections) or medicines to increase the production of certain types of hemoglobin.  Fluids.  Oxygen.  Blood transfusions. HOME CARE INSTRUCTIONS   Drink enough fluid to keep your urine clear or pale yellow. Increase your fluid intake in hot weather and during exercise.  Do not smoke. Smoking lowers oxygen levels in the blood.   Only take over-the-counter or prescription medicines for pain, fever, or discomfort as directed by your health care provider.  Take antibiotics as directed by your health care provider. Make sure you finish them it even if you start to feel better.   Take supplements as directed by your health care provider.   Consider wearing a medical alert bracelet. This tells anyone caring for you in an emergency of your condition.   When traveling, keep your medical information, health care provider's names, and the medicines you take with you at all times.   If you develop a fever, do not take medicines to reduce the fever right away. This could cover up a problem that is developing. Notify your health care provider.  Keep all follow-up appointments with your health care provider. Sickle cell anemia requires regular medical care. SEEK MEDICAL CARE IF: You have a fever. SEEK IMMEDIATE MEDICAL CARE IF:   You feel dizzy or faint.   You have new abdominal pain, especially on the left side near the stomach area.   You develop a persistent, often uncomfortable and painful penile erection (priapism). If this is not treated immediately it   will lead to impotence.   You have numbness your arms or legs or you have a hard time moving them.   You have a hard time with speech.   You have a fever or persistent symptoms for more than 2-3 days.   You have a fever and your symptoms suddenly get worse.   You have signs or symptoms of infection.  These include:   Chills.   Abnormal tiredness (lethargy).   Irritability.   Poor eating.   Vomiting.   You develop pain that is not helped with medicine.   You develop shortness of breath.  You have pain in your chest.   You are coughing up pus-like or bloody sputum.   You develop a stiff neck.  Your feet or hands swell or have pain.  Your abdomen appears bloated.  You develop joint pain. MAKE SURE YOU:  Understand these instructions.   This information is not intended to replace advice given to you by your health care provider. Make sure you discuss any questions you have with your health care provider.   Document Released: 04/27/2005 Document Revised: 02/07/2014 Document Reviewed: 08/29/2012 Elsevier Interactive Patient Education 2016 [employer]  

## 2015-05-07 NOTE — H&P (Signed)
[facility] History and Physical  [name] [id number] DOB: 1980-02-11 DOA: 05/07/2015  PCP: [name], MD   Chief Complaint: No chief complaint on file.   HPI: [name] is a 35 y.o. male with history of sickle cell anemia, prior jugular vein embolism on chronic anticoagulation, hypertension, acute chest syndrome, high ED and inpatient admission utilizer for sickle cell disease related pain and chronic pain syndrome, came to the hospital today with major complaints of pain in both upper and lower limbs as well as the lower back which has been ongoing for about 2 days. Patient was in the ED this morning for the same complaint and was sent here for extended observation and management of sickle cell pain. Patient denies any history of fever or chills, no nausea or vomiting, no urinary symptoms, no change in bowel habits, no chest pain, no shortness of breath, no abdominal pain. In the emergency department, patient appeared to be nontoxic, no distress no significant lab abnormalities different from previous results noted.  Systemic Review: General: The patient denies anorexia, fever, weight loss Cardiac: Denies chest pain, syncope, palpitations, pedal edema  Respiratory: Denies cough, shortness of breath, wheezing GI: Denies severe indigestion/heartburn, abdominal pain, nausea, vomiting, diarrhea and constipation GU: Denies hematuria, incontinence, dysuria  Skin: Denies suspicious skin lesions Neurologic: Denies focal weakness or numbness, change in vision  Past Medical History  Diagnosis Date  . Sickle cell anemia (HCC)   . Blood transfusion   . Acute embolism and thrombosis of right internal jugular vein ([place])   . Hypokalemia   . Mood disorder ([place])   . History of pulmonary embolus (PE)   . Avascular necrosis (HCC)     Right Hip  . Leukocytosis     Chronic  . Thrombocytosis (HCC)     Chronic  . Hypertension   . History of Clostridium  difficile infection   . Uses marijuana   . Chronic anticoagulation   . Functional asplenia   . Former smoker   . Second hand tobacco smoke exposure   . Alcohol consumption of one to four drinks per day   . Noncompliance with medication regimen   . Sickle-cell crisis with associated acute chest syndrome ([place]) 05/13/2013  . Acute chest syndrome ([place]) 06/18/2013  . Demand ischemia ([place]) 01/02/2014  . Pulmonary hypertension ([place])   . Hb-SS disease with crisis [facility])     Past Surgical History  Procedure Laterality Date  . Right hip replacement      08/2006  . Cholecystectomy      01/2008  . Porta cath placement    . Porta cath removal    . Umbilical hernia repair      01/2008  . Excision of left periauricular cyst      10/2009  . Excision of right ear lobe cyst with primary closur      11/2007  . Portacath placement  01/05/2012    Procedure: INSERTION PORT-A-CATH;  Surgeon: Odis Hollingshead, MD;  Location: [place];  Service: General;  Laterality: N/A;  ultrasound guiced port a cath insertion with fluoroscopy    No Known Allergies  Family History  Problem Relation Age of Onset  . Sickle cell trait Mother   . Depression Mother   . Diabetes Mother   . Sickle cell trait Father   . Sickle cell trait Brother       Prior to Admission medications   Medication Sig Start Date End Date Taking? Authorizing Provider  aspirin 81 MG chewable tablet Chew 1 tablet (81 mg total) by mouth daily. 07/23/14   [name], MD  Cholecalciferol (VITAMIN D) 2000 units tablet Take 1 tablet (2,000 Units total) by mouth daily. 02/23/15   Dorena Dew, FNP  cyclobenzaprine (FLEXERIL) 5 MG tablet Take 1 tablet (5 mg total) by mouth 3 (three) times daily as needed for muscle spasms. Patient not taking: Reported on 05/07/2015 02/19/15   Alfonzo Beers, MD  enoxaparin (LOVENOX) 120 MG/0.8ML injection Inject 0.8 mLs (120 mg total) into the skin daily. 12/03/14   [name], MD  folic acid (FOLVITE)  1 MG tablet Take 1 tablet (1 mg total) by mouth every morning. 02/23/15   Dorena Dew, FNP  gabapentin (NEURONTIN) 300 MG capsule Take 1 capsule (300 mg total) by mouth 3 (three) times daily. Patient not taking: Reported on 05/07/2015 05/04/15   Dorena Dew, FNP  HYDROmorphone (DILAUDID) 4 MG tablet Take 1 tablet (4 mg total) by mouth every 4 (four) hours as needed for severe pain. 05/05/15   Dorena Dew, FNP  hydroxyurea (HYDREA) 500 MG capsule Take 1 capsule (500 mg total) by mouth daily. May take with food to minimize GI side effects. 11/06/14   Dorena Dew, FNP  lisinopril (PRINIVIL,ZESTRIL) 10 MG tablet Take 1 tablet (10 mg total) by mouth daily. 04/13/15   Dorena Dew, FNP  metoprolol succinate (TOPROL-XL) 50 MG 24 hr tablet Take 1 tablet (50 mg total) by mouth daily. 02/23/15   Dorena Dew, FNP  morphine (MS CONTIN) 30 MG 12 hr tablet Take 1 tablet (30 mg total) by mouth every 12 (twelve) hours. 05/05/15   Dorena Dew, FNP  potassium chloride SA (K-DUR,KLOR-CON) 20 MEQ tablet Take 1 tablet (20 mEq total) by mouth every morning. 04/13/15   Dorena Dew, FNP  zolpidem (AMBIEN) 10 MG tablet Take 1 tablet (10 mg total) by mouth at bedtime as needed for sleep. 05/05/15   Dorena Dew, FNP     Physical Exam: Filed Vitals:   05/07/15 1155  BP: 118/74  Pulse: 60  Temp: 98.4 F (36.9 C)  TempSrc: Oral  Resp: 18  Weight: 172 lb (78.019 kg)  SpO2: 95%    General: Alert, awake, afebrile, anicteric, not in obvious distress HEENT: Normocephalic and Atraumatic, Mucous membranes pink                PERRLA; EOM intact; No scleral icterus,                 Nares: Patent, Oropharynx: Clear, Fair Dentition                 Neck: FROM, no cervical lymphadenopathy, thyromegaly, carotid bruit or JVD;  CHEST WALL: No tenderness  CHEST: Normal respiration, clear to auscultation bilaterally  HEART: Regular rate and rhythm; no murmurs rubs or gallops  BACK: No kyphosis or  scoliosis; no CVA tenderness  ABDOMEN: Positive Bowel Sounds, soft, non-tender; no masses, no organomegaly Rectal Exam: deferred EXTREMITIES: No cyanosis, clubbing, or edema SKIN:  no rash or ulceration  CNS: Alert and Oriented x 4, Nonfocal exam, CN 2-12 intact  Labs on Admission:  Basic Metabolic Panel:  Recent Labs Lab 05/05/15 1107 05/07/15 0940  NA 141 142  K 4.2 4.0  CL 111 111  CO2 22 23  GLUCOSE 104* 111*  BUN 10 8  CREATININE 0.75 0.67  CALCIUM 9.3 9.3   Liver Function Tests:  Recent  Labs Lab 05/05/15 1107 05/07/15 0940  AST 29 31  ALT 21 20  ALKPHOS 82 76  BILITOT 3.7* 4.2*  PROT 8.1 7.7  ALBUMIN 4.7 4.3   No results for input(s): LIPASE, AMYLASE in the last 168 hours. No results for input(s): AMMONIA in the last 168 hours. CBC:  Recent Labs Lab 05/05/15 1107 05/07/15 0940  WBC 18.1* 17.7*  NEUTROABS 13.5* 12.7*  HGB 6.9* 6.8*  HCT 20.4* 20.0*  MCV 88.3 87.7  PLT 605* 572*   Cardiac Enzymes: No results for input(s): CKTOTAL, CKMB, CKMBINDEX, TROPONINI in the last 168 hours.  BNP (last 3 results)  Recent Labs  08/29/14 0810 09/10/14 0612 11/30/14 0910  BNP 1117.0* 549.5* 639.7*    ProBNP (last 3 results) No results for input(s): PROBNP in the last 8760 hours.  CBG: No results for input(s): GLUCAP in the last 168 hours.   Assessment/Plan Active Problems:   Sickle cell anemia with pain ([place])   Admits to the Day Hospital  IVF D5 .45% Saline @ 125 mls/hour  Weight based Dilaudid PCA started within 30 minutes of admission  IV Toradol 30 mg Q 6 H  Monitor vitals very closely, Re-evaluate pain scale every hour  Oxygen by Bernice  Patient will be re-evaluated for pain in the context of function and relationship to baseline as care progresses.  If no significant relieve from pain (remains above 5/10) will transfer patient to inpatient services for further evaluation and management  Code Status: Full  Family Communication:  None  DVT Prophylaxis: Ambulate as tolerated   Time spent: 18 Minutes  Kip Kautzman, MD, MHA, FACP, FAAP, CPE  If 7PM-7AM, please contact night-coverage [email or web address] 05/07/2015, 11:58 AM

## 2015-05-07 NOTE — ED Provider Notes (Signed)
CSN: [id number]     Arrival date & time 05/07/15  [id number] History   First MD Initiated Contact with Patient 05/07/15 0818     Chief Complaint  Patient presents with  . Sickle Cell Pain Crisis     (Consider location/radiation/quality/duration/timing/severity/associated sxs/prior Treatment) HPI   [name] is a 35 y.o. male, with a history of Hb-SS sinus sickle cell anemia, acute chest, sickle cell crisis, and pulmonary hypertension, presenting to the ED with bilateral flank and lower back pain for the last two days. Pt states this pain is consistent with his sickle cell pain. Patient's last dose of dilaudid was at 6am this morning with minimal relief. Pt rates his pain at 9/10, aching, nonradiating. Pt denies fever/chills, N/V, urinary complaints, abdominal pain, chest pain, shortness of breath, or any other complaints.     Past Medical History  Diagnosis Date  . Sickle cell anemia (HCC)   . Blood transfusion   . Acute embolism and thrombosis of right internal jugular vein ([place])   . Hypokalemia   . Mood disorder ([place])   . History of pulmonary embolus (PE)   . Avascular necrosis (HCC)     Right Hip  . Leukocytosis     Chronic  . Thrombocytosis (HCC)     Chronic  . Hypertension   . History of Clostridium difficile infection   . Uses marijuana   . Chronic anticoagulation   . Functional asplenia   . Former smoker   . Second hand tobacco smoke exposure   . Alcohol consumption of one to four drinks per day   . Noncompliance with medication regimen   . Sickle-cell crisis with associated acute chest syndrome ([place]) 05/13/2013  . Acute chest syndrome ([place]) 06/18/2013  . Demand ischemia ([place]) 01/02/2014  . Pulmonary hypertension ([place])   . Hb-SS disease with crisis [facility])    Past Surgical History  Procedure Laterality Date  . Right hip replacement      08/2006  . Cholecystectomy      01/2008  . Porta cath placement    . Porta cath removal    . Umbilical hernia repair       01/2008  . Excision of left periauricular cyst      10/2009  . Excision of right ear lobe cyst with primary closur      11/2007  . Portacath placement  01/05/2012    Procedure: INSERTION PORT-A-CATH;  Surgeon: Odis Hollingshead, MD;  Location: [place];  Service: General;  Laterality: N/A;  ultrasound guiced port a cath insertion with fluoroscopy   Family History  Problem Relation Age of Onset  . Sickle cell trait Mother   . Depression Mother   . Diabetes Mother   . Sickle cell trait Father   . Sickle cell trait Brother    Social History  Substance Use Topics  . Smoking status: Former Smoker -- 13 years  . Smokeless tobacco: Never Used  . Alcohol Use: No    Review of Systems  Constitutional: Negative for fever and chills.  Respiratory: Negative for shortness of breath.   Cardiovascular: Negative for chest pain.  Gastrointestinal: Negative for nausea, vomiting and abdominal pain.  Genitourinary: Positive for flank pain. Negative for dysuria and hematuria.  Musculoskeletal: Positive for back pain.  Neurological: Negative for dizziness, light-headedness and headaches.  All other systems reviewed and are negative.     Allergies  Review of patient's allergies indicates no known allergies.  Home Medications   Prior to  Admission medications   Medication Sig Start Date End Date Taking? Authorizing Provider  aspirin 81 MG chewable tablet Chew 1 tablet (81 mg total) by mouth daily. 07/23/14  Yes [name], MD  Cholecalciferol (VITAMIN D) 2000 units tablet Take 1 tablet (2,000 Units total) by mouth daily. 02/23/15  Yes Dorena Dew, FNP  enoxaparin (LOVENOX) 120 MG/0.8ML injection Inject 0.8 mLs (120 mg total) into the skin daily. 12/03/14  Yes [name], MD  folic acid (FOLVITE) 1 MG tablet Take 1 tablet (1 mg total) by mouth every morning. 02/23/15  Yes Dorena Dew, FNP  HYDROmorphone (DILAUDID) 4 MG tablet Take 1 tablet (4 mg total) by mouth every 4 (four)  hours as needed for severe pain. 05/05/15  Yes Dorena Dew, FNP  hydroxyurea (HYDREA) 500 MG capsule Take 1 capsule (500 mg total) by mouth daily. May take with food to minimize GI side effects. 11/06/14  Yes Dorena Dew, FNP  lisinopril (PRINIVIL,ZESTRIL) 10 MG tablet Take 1 tablet (10 mg total) by mouth daily. 04/13/15  Yes Dorena Dew, FNP  metoprolol succinate (TOPROL-XL) 50 MG 24 hr tablet Take 1 tablet (50 mg total) by mouth daily. 02/23/15  Yes Dorena Dew, FNP  morphine (MS CONTIN) 30 MG 12 hr tablet Take 1 tablet (30 mg total) by mouth every 12 (twelve) hours. 05/05/15  Yes Dorena Dew, FNP  potassium chloride SA (K-DUR,KLOR-CON) 20 MEQ tablet Take 1 tablet (20 mEq total) by mouth every morning. 04/13/15  Yes Dorena Dew, FNP  zolpidem (AMBIEN) 10 MG tablet Take 1 tablet (10 mg total) by mouth at bedtime as needed for sleep. 05/05/15  Yes Dorena Dew, FNP  cyclobenzaprine (FLEXERIL) 5 MG tablet Take 1 tablet (5 mg total) by mouth 3 (three) times daily as needed for muscle spasms. Patient not taking: Reported on 05/07/2015 02/19/15   Alfonzo Beers, MD  gabapentin (NEURONTIN) 300 MG capsule Take 1 capsule (300 mg total) by mouth 3 (three) times daily. Patient not taking: Reported on 05/07/2015 05/04/15   Dorena Dew, FNP   BP 115/79 mmHg  Pulse 60  Temp(Src) 98.1 F (36.7 C) (Oral)  Resp 18  Wt 78.019 kg  SpO2 94% Physical Exam  Constitutional: He is oriented to person, place, and time. He appears well-developed and well-nourished. No distress.  HENT:  Head: Normocephalic and atraumatic.  Eyes: Conjunctivae are normal. Pupils are equal, round, and reactive to light.  Neck: Neck supple.  Cardiovascular: Normal rate, regular rhythm, normal heart sounds and intact distal pulses.   Pulmonary/Chest: Effort normal and breath sounds normal. No respiratory distress. He exhibits no tenderness.  Abdominal: Soft. There is no tenderness. There is no guarding.   Musculoskeletal: He exhibits no edema or tenderness.  Lymphadenopathy:    He has no cervical adenopathy.  Neurological: He is alert and oriented to person, place, and time. He has normal reflexes.  Skin: Skin is warm and dry. He is not diaphoretic.  Psychiatric: He has a normal mood and affect. His behavior is normal.  Nursing note and vitals reviewed.   ED Course  Procedures (including critical care time) Labs Review Labs Reviewed  RETICULOCYTES - Abnormal; Notable for the following:    Retic Ct Pct 10.8 (*)    RBC. 2.28 (*)    Retic Count, Manual 246.2 (*)    All other components within normal limits  APTT - Abnormal; Notable for the following:    aPTT 106 (*)  All other components within normal limits  PROTIME-INR - Abnormal; Notable for the following:    Prothrombin Time 16.0 (*)    All other components within normal limits  CBC WITH DIFFERENTIAL/PLATELET - Abnormal; Notable for the following:    WBC 17.7 (*)    RBC 2.28 (*)    Hemoglobin 6.8 (*)    HCT 20.0 (*)    RDW 22.3 (*)    Platelets 572 (*)    nRBC 1 (*)    Neutro Abs 12.7 (*)    Monocytes Absolute 2.1 (*)    All other components within normal limits  COMPREHENSIVE METABOLIC PANEL - Abnormal; Notable for the following:    Glucose, Bld 111 (*)    Total Bilirubin 4.2 (*)    All other components within normal limits  URINALYSIS, ROUTINE W REFLEX MICROSCOPIC (NOT AT [facility])    Imaging Review Dg Chest 2 View  05/07/2015  CLINICAL DATA:  Sickle cell pain crisis, chest pain EXAM: CHEST  2 VIEW COMPARISON:  04/26/2015 FINDINGS: Left subclavian power port catheter tip SVC RA junction. Stable cardiomegaly with vascular congestion and basilar atelectasis/ parenchymal scarring. No superimposed pneumonia, collapse or consolidation. No edema, effusion or pneumothorax. Trachea midline. IMPRESSION: Stable cardiomegaly with vascular congestion and parenchymal scarring. No superimposed acute process. Electronically Signed   By:  Jerilynn Mages.  Shick M.D.   On: 05/07/2015 09:05   I have personally reviewed and evaluated these images and lab results as part of my medical decision-making.   EKG Interpretation None      Medications  0.45 % sodium chloride infusion ( Intravenous New Bag/Given 05/07/15 0931)  heparin lock flush 100 unit/mL (not administered)  ketorolac (TORADOL) 30 MG/ML injection 30 mg (30 mg Intravenous Given 05/07/15 0932)  HYDROmorphone (DILAUDID) injection 2 mg (2 mg Intravenous Given 05/07/15 0932)    Or  HYDROmorphone (DILAUDID) injection 2 mg ( Subcutaneous See Alternative 05/07/15 0932)  HYDROmorphone (DILAUDID) injection 2.4 mg (2.4 mg Intravenous Given 05/07/15 1053)    Or  HYDROmorphone (DILAUDID) injection 2.4 mg ( Subcutaneous See Alternative 05/07/15 1053)  diphenhydrAMINE (BENADRYL) injection 25 mg (25 mg Intravenous Given 05/07/15 1053)    MDM   Final diagnoses:  Sickle cell anemia with pain ([place])    Dena Billet Asmus presents with bilateral flank and lower back pain that he states is consistent with his sickle cell pain for the past 2 days.  This patient states that his pain is consistent with his sickle cell pain. Patient is nontoxic appearing, afebrile, not tachycardic, not tachypneic, maintains adequate SPO2 on room air, and is in no apparent distress. Patient has no signs of sepsis or other serious or life-threatening condition. Furthermore, patient has no signs of acute chest or other sickle cell emergency. Sickle cell clinic contacted.  8:46 AM Spoke with Dr. Doreene Burke from the [facility]. States that he would be happy to see the patient at the sickle cell clinic today as long as the patient is stable. Recommends checking labs, getting pain management, and if labs are stable, call him back for transfer to the sickle cell clinic. 10:25 AM Pt states his pain has not improved. Second dose of pain medication ordered. Lab abnormalities are noted and found to be consistent with previous  values. 10:29 AM Spoke with Dr. Doreene Burke again. States that he will send somebody from the clinic to treat the patient and transfer him over.  Filed Vitals:   05/07/15 0818 05/07/15 0921 05/07/15 1038  BP: 122/91  115/79  Pulse: 83  60  Temp: 98.1 F (36.7 C)    TempSrc: Oral    Resp:   18  Weight:  78.019 kg   SpO2: 95%  94%     Lorayne Bender, PA-C 05/07/15 [place], MD 05/12/15 1453

## 2015-05-07 NOTE — Discharge Instructions (Signed)
You have been seen today for sickle cell pain. Your imaging and lab tests showed no abnormalities. You are being transferred to the sickle cell clinic today for further treatment. Follow up with PCP as needed. Return to ED should symptoms worsen.

## 2015-05-07 NOTE — Progress Notes (Signed)
Admitted from ED and treated in the [facility] for pain 9/10 in ribs and back, both sides. Treated with IV fluids, IV Toradol and Dilaudid PCA. Pain level 7/10 at time of discharge. Went over discharge instructions and copy given to patient. Alert, oriented and ambulatory at time of discharge. Discharged to home, with a ride home.

## 2015-05-07 NOTE — Progress Notes (Signed)
This pt has [facility] admissions x 27 and ED visits x 15  This pt is being followed by [facility] Sickle cell clinic, [facility] ([facility]) and [facility] In the last month (04/06/15 to 05/07/15) this pt has been admitted x 3 to [facility] clinic (04/07/15 to 05/05/15-pending 05/07/15)- and admitted to [facility] on 04/26/15  - had an office visit with [facility] sickle cell clinic MD on 05/05/15  Pt is receiving [facility] calls once a month, continues with ED visitsPt schedule to be seen for [facility] office visit on 05/11/15 at 1500  Pt next Office visit at [facility] Sickle cell clinic is 06/09/15 at 65 with Dr Doreene Burke  Pt next Pulmonology appt for pulmonary htn is on 05/29/15 at 0915   Entered in d/c instructions  Jegede, Olugbemiga E On 06/09/2015 You have a scheduled appointment at [place] [id number] [facility] On 05/11/2015 You have an appointment at 3 pm on 05/11/15 [place] [place] [phone]   Sent email to Penni Bombard (ED CP)

## 2015-05-08 ENCOUNTER — Encounter (HOSPITAL_COMMUNITY): Payer: Self-pay | Admitting: Emergency Medicine

## 2015-05-08 ENCOUNTER — Inpatient Hospital Stay (HOSPITAL_COMMUNITY)
Admission: EM | Admit: 2015-05-08 | Discharge: 2015-05-15 | DRG: 812 | Disposition: A | Discharge status: Home / Self Care | Payer: Medicare Other | Attending: Internal Medicine | Admitting: Internal Medicine

## 2015-05-08 ENCOUNTER — Telehealth (HOSPITAL_COMMUNITY): Payer: Self-pay

## 2015-05-08 DIAGNOSIS — J9611 Chronic respiratory failure with hypoxia: Secondary | ICD-10-CM

## 2015-05-08 DIAGNOSIS — D638 Anemia in other chronic diseases classified elsewhere: Secondary | ICD-10-CM

## 2015-05-08 DIAGNOSIS — I2781 Cor pulmonale (chronic): Secondary | ICD-10-CM | POA: Diagnosis present

## 2015-05-08 DIAGNOSIS — Z86718 Personal history of other venous thrombosis and embolism: Secondary | ICD-10-CM | POA: Diagnosis not present

## 2015-05-08 DIAGNOSIS — D72829 Elevated white blood cell count, unspecified: Secondary | ICD-10-CM

## 2015-05-08 DIAGNOSIS — D5700 Hb-SS disease with crisis, unspecified: Secondary | ICD-10-CM | POA: Diagnosis not present

## 2015-05-08 DIAGNOSIS — R1900 Intra-abdominal and pelvic swelling, mass and lump, unspecified site: Secondary | ICD-10-CM | POA: Diagnosis not present

## 2015-05-08 DIAGNOSIS — Z87891 Personal history of nicotine dependence: Secondary | ICD-10-CM | POA: Diagnosis not present

## 2015-05-08 DIAGNOSIS — G8929 Other chronic pain: Secondary | ICD-10-CM | POA: Diagnosis present

## 2015-05-08 DIAGNOSIS — Z96641 Presence of right artificial hip joint: Secondary | ICD-10-CM | POA: Diagnosis present

## 2015-05-08 DIAGNOSIS — D599 Acquired hemolytic anemia, unspecified: Secondary | ICD-10-CM | POA: Diagnosis not present

## 2015-05-08 DIAGNOSIS — J9621 Acute and chronic respiratory failure with hypoxia: Secondary | ICD-10-CM | POA: Diagnosis not present

## 2015-05-08 DIAGNOSIS — Z7901 Long term (current) use of anticoagulants: Secondary | ICD-10-CM | POA: Diagnosis not present

## 2015-05-08 DIAGNOSIS — R509 Fever, unspecified: Secondary | ICD-10-CM | POA: Diagnosis not present

## 2015-05-08 LAB — RETICULOCYTES
RBC.: 2.14 MIL/uL — ABNORMAL LOW (ref 4.22–5.81)
Retic Count, Absolute: 248.2 10*3/uL — ABNORMAL HIGH (ref 19.0–186.0)
Retic Ct Pct: 11.6 % — ABNORMAL HIGH (ref 0.4–3.1)

## 2015-05-08 LAB — CBC
HEMATOCRIT: 19.7 % — AB (ref 39.0–52.0)
HEMOGLOBIN: 6.7 g/dL — AB (ref 13.0–17.0)
MCH: 30.3 pg (ref 26.0–34.0)
MCHC: 34 g/dL (ref 30.0–36.0)
MCV: 89.1 fL (ref 78.0–100.0)
Platelets: 561 10*3/uL — ABNORMAL HIGH (ref 150–400)
RBC: 2.21 MIL/uL — ABNORMAL LOW (ref 4.22–5.81)
RDW: 21.9 % — ABNORMAL HIGH (ref 11.5–15.5)
WBC: 22.3 10*3/uL — AB (ref 4.0–10.5)

## 2015-05-08 LAB — DIFFERENTIAL
BASOS PCT: 1 %
Basophils Absolute: 0.2 10*3/uL — ABNORMAL HIGH (ref 0.0–0.1)
Eosinophils Absolute: 0.4 10*3/uL (ref 0.0–0.7)
Eosinophils Relative: 2 %
LYMPHS ABS: 2.7 10*3/uL (ref 0.7–4.0)
LYMPHS PCT: 12 %
MONOS PCT: 11 %
Monocytes Absolute: 2.5 10*3/uL — ABNORMAL HIGH (ref 0.1–1.0)
NEUTROS PCT: 74 %
Neutro Abs: 16.5 10*3/uL — ABNORMAL HIGH (ref 1.7–7.7)

## 2015-05-08 LAB — I-STAT TROPONIN, ED: Troponin i, poc: 0 ng/mL (ref 0.00–0.08)

## 2015-05-08 LAB — LACTATE DEHYDROGENASE: LDH: 381 U/L — AB (ref 98–192)

## 2015-05-08 LAB — MRSA PCR SCREENING: MRSA BY PCR: NEGATIVE

## 2015-05-08 MED ORDER — HYDROMORPHONE HCL 2 MG/ML IJ SOLN
2.0000 mg | Freq: Once | INTRAMUSCULAR | Status: AC
  Administered 2015-05-08: 2 mg via INTRAVENOUS
  Filled 2015-05-08: qty 1

## 2015-05-08 MED ORDER — KETOROLAC TROMETHAMINE 30 MG/ML IJ SOLN
30.0000 mg | Freq: Once | INTRAMUSCULAR | Status: AC
  Administered 2015-05-08: 30 mg via INTRAVENOUS
  Filled 2015-05-08: qty 1

## 2015-05-08 MED ORDER — LISINOPRIL 10 MG PO TABS
10.0000 mg | ORAL_TABLET | Freq: Every day | ORAL | Status: DC
  Administered 2015-05-08 – 2015-05-15 (×8): 10 mg via ORAL
  Filled 2015-05-08 (×8): qty 1

## 2015-05-08 MED ORDER — DIPHENHYDRAMINE HCL 50 MG/ML IJ SOLN: 25.0000 mg | Freq: Once | INTRAMUSCULAR | Status: DC

## 2015-05-08 MED ORDER — POLYETHYLENE GLYCOL 3350 17 G PO PACK: 17.0000 g | PACK | Freq: Every day | ORAL | Status: DC | PRN

## 2015-05-08 MED ORDER — HYDROXYUREA 500 MG PO CAPS
1000.0000 mg | ORAL_CAPSULE | Freq: Every day | ORAL | Status: DC
  Administered 2015-05-08 – 2015-05-15 (×8): 1000 mg via ORAL
  Filled 2015-05-08 (×8): qty 2

## 2015-05-08 MED ORDER — SENNOSIDES-DOCUSATE SODIUM 8.6-50 MG PO TABS
1.0000 | ORAL_TABLET | Freq: Two times a day (BID) | ORAL | Status: DC
  Administered 2015-05-08 – 2015-05-15 (×14): 1 via ORAL
  Filled 2015-05-08 (×14): qty 1

## 2015-05-08 MED ORDER — HYDROMORPHONE HCL 2 MG/ML IJ SOLN
2.0000 mg | INTRAMUSCULAR | Status: DC | PRN
  Administered 2015-05-09 – 2015-05-10 (×7): 2 mg via INTRAVENOUS
  Filled 2015-05-08 (×6): qty 1

## 2015-05-08 MED ORDER — KETOROLAC TROMETHAMINE 30 MG/ML IJ SOLN
30.0000 mg | Freq: Four times a day (QID) | INTRAMUSCULAR | Status: AC
  Administered 2015-05-08 – 2015-05-13 (×20): 30 mg via INTRAVENOUS
  Filled 2015-05-08 (×20): qty 1

## 2015-05-08 MED ORDER — FOLIC ACID 1 MG PO TABS
1.0000 mg | ORAL_TABLET | Freq: Every morning | ORAL | Status: DC
  Administered 2015-05-09 – 2015-05-15 (×7): 1 mg via ORAL
  Filled 2015-05-08 (×8): qty 1

## 2015-05-08 MED ORDER — VITAMIN D 1000 UNITS PO TABS
2000.0000 [IU] | ORAL_TABLET | Freq: Every day | ORAL | Status: DC
  Administered 2015-05-08 – 2015-05-15 (×8): 2000 [IU] via ORAL
  Filled 2015-05-08 (×8): qty 2

## 2015-05-08 MED ORDER — NALOXONE HCL 0.4 MG/ML IJ SOLN: 0.4000 mg | INTRAMUSCULAR | Status: DC | PRN

## 2015-05-08 MED ORDER — GABAPENTIN 300 MG PO CAPS
300.0000 mg | ORAL_CAPSULE | Freq: Three times a day (TID) | ORAL | Status: DC
  Administered 2015-05-08 – 2015-05-15 (×20): 300 mg via ORAL
  Filled 2015-05-08 (×21): qty 1

## 2015-05-08 MED ORDER — HYDROMORPHONE HCL 2 MG/ML IJ SOLN
2.5000 mg | Freq: Once | INTRAMUSCULAR | Status: AC
Start: 2015-05-08 — End: 2015-05-08
  Administered 2015-05-08: 2.5 mg via INTRAVENOUS
  Filled 2015-05-08: qty 2

## 2015-05-08 MED ORDER — HYDROMORPHONE HCL 2 MG/ML IJ SOLN
2.0000 mg | INTRAMUSCULAR | Status: AC | PRN
  Administered 2015-05-09 (×5): 2 mg via INTRAVENOUS
  Filled 2015-05-08 (×6): qty 1

## 2015-05-08 MED ORDER — ASPIRIN 81 MG PO CHEW
81.0000 mg | CHEWABLE_TABLET | Freq: Every day | ORAL | Status: DC
  Administered 2015-05-08 – 2015-05-15 (×8): 81 mg via ORAL
  Filled 2015-05-08 (×8): qty 1

## 2015-05-08 MED ORDER — POTASSIUM CHLORIDE CRYS ER 20 MEQ PO TBCR
20.0000 meq | EXTENDED_RELEASE_TABLET | Freq: Every morning | ORAL | Status: DC
  Administered 2015-05-09 – 2015-05-15 (×7): 20 meq via ORAL
  Filled 2015-05-08 (×8): qty 1

## 2015-05-08 MED ORDER — HYDROMORPHONE HCL 2 MG/ML IJ SOLN
2.0000 mg | INTRAMUSCULAR | Status: AC
  Administered 2015-05-08 – 2015-05-09 (×6): 2 mg via INTRAVENOUS
  Filled 2015-05-08 (×5): qty 1

## 2015-05-08 MED ORDER — MORPHINE SULFATE ER 30 MG PO TBCR: 30.0000 mg | EXTENDED_RELEASE_TABLET | Freq: Two times a day (BID) | ORAL | Status: DC

## 2015-05-08 MED ORDER — DEXTROSE-NACL 5-0.45 % IV SOLN
INTRAVENOUS | Status: DC
  Administered 2015-05-08 – 2015-05-13 (×5): via INTRAVENOUS

## 2015-05-08 MED ORDER — ZOLPIDEM TARTRATE 10 MG PO TABS
10.0000 mg | ORAL_TABLET | Freq: Every evening | ORAL | Status: DC | PRN
Start: 2015-05-08 — End: 2015-05-15
  Administered 2015-05-08 – 2015-05-14 (×7): 10 mg via ORAL
  Filled 2015-05-08 (×7): qty 1

## 2015-05-08 MED ORDER — SODIUM CHLORIDE 0.9% FLUSH: 9.0000 mL | INTRAVENOUS | Status: DC | PRN

## 2015-05-08 MED ORDER — SODIUM CHLORIDE 0.9 % IV SOLN
12.5000 mg | Freq: Four times a day (QID) | INTRAVENOUS | Status: DC | PRN
  Administered 2015-05-09: 12.5 mg via INTRAVENOUS
  Filled 2015-05-08 (×3): qty 0.25

## 2015-05-08 MED ORDER — MORPHINE SULFATE ER 30 MG PO TBCR
30.0000 mg | EXTENDED_RELEASE_TABLET | Freq: Two times a day (BID) | ORAL | Status: DC
Start: 2015-05-08 — End: 2015-05-15
  Administered 2015-05-08 – 2015-05-15 (×14): 30 mg via ORAL
  Filled 2015-05-08 (×15): qty 1

## 2015-05-08 MED ORDER — ENOXAPARIN SODIUM 120 MG/0.8ML ~~LOC~~ SOLN
120.0000 mg | SUBCUTANEOUS | Status: DC
  Administered 2015-05-08 – 2015-05-14 (×7): 120 mg via SUBCUTANEOUS
  Filled 2015-05-08 (×8): qty 0.8

## 2015-05-08 MED ORDER — ONDANSETRON HCL 4 MG/2ML IJ SOLN: 4.0000 mg | Freq: Four times a day (QID) | INTRAMUSCULAR | Status: DC | PRN

## 2015-05-08 MED ORDER — HYDROMORPHONE 1 MG/ML IV SOLN
INTRAVENOUS | Status: DC
  Administered 2015-05-08: 11 mg via INTRAVENOUS
  Administered 2015-05-08 – 2015-05-09 (×2): via INTRAVENOUS
  Administered 2015-05-09: 3 mg via INTRAVENOUS
  Administered 2015-05-09: 5 mg via INTRAVENOUS
  Administered 2015-05-09: 5.5 mg via INTRAVENOUS
  Administered 2015-05-09: 7 mg via INTRAVENOUS
  Administered 2015-05-09: 10:00:00 via INTRAVENOUS
  Administered 2015-05-09: 4 mg via INTRAVENOUS
  Administered 2015-05-09: 5.5 mg via INTRAVENOUS
  Administered 2015-05-09: 5 mg via INTRAVENOUS
  Administered 2015-05-10: 3.5 mg via INTRAVENOUS
  Administered 2015-05-10 (×2): 5 mg via INTRAVENOUS
  Administered 2015-05-10: 3 mg via INTRAVENOUS
  Administered 2015-05-10: 5 mg via INTRAVENOUS
  Administered 2015-05-10: 1 mg via INTRAVENOUS
  Administered 2015-05-10: 4 mg via INTRAVENOUS
  Administered 2015-05-11: 6.5 mg via INTRAVENOUS
  Administered 2015-05-11: 2.5 mg via INTRAVENOUS
  Administered 2015-05-11: 5.5 mg via INTRAVENOUS
  Administered 2015-05-11: 1.5 mg via INTRAVENOUS
  Administered 2015-05-11: 6 mg via INTRAVENOUS
  Administered 2015-05-11: via INTRAVENOUS
  Administered 2015-05-11: 4 mg via INTRAVENOUS
  Administered 2015-05-12: 1 mg via INTRAVENOUS
  Administered 2015-05-12: 8 mg via INTRAVENOUS
  Administered 2015-05-12: 16:00:00 via INTRAVENOUS
  Administered 2015-05-12: 3.5 mg via INTRAVENOUS
  Administered 2015-05-12 (×2): 4.5 mg via INTRAVENOUS
  Administered 2015-05-13: 0 mg via INTRAVENOUS
  Administered 2015-05-13: 7.4 mg via INTRAVENOUS
  Administered 2015-05-13: 5 mg via INTRAVENOUS
  Administered 2015-05-13: 7 mg via INTRAVENOUS
  Administered 2015-05-13: 7.5 mg via INTRAVENOUS
  Administered 2015-05-13 – 2015-05-14 (×2): 1 mg via INTRAVENOUS
  Administered 2015-05-14: 0.4 mg via INTRAVENOUS
  Administered 2015-05-14: 9.33 mg via INTRAVENOUS
  Administered 2015-05-14: 10.6 mg via INTRAVENOUS
  Administered 2015-05-14: 14:00:00 via INTRAVENOUS
  Administered 2015-05-14: 7 mg via INTRAVENOUS
  Administered 2015-05-14: 6 mg via INTRAVENOUS
  Administered 2015-05-15: 5.5 mg via INTRAVENOUS
  Administered 2015-05-15: 1 mg via INTRAVENOUS
  Administered 2015-05-15: 11.1 mg via INTRAVENOUS
  Administered 2015-05-15: 1 mg via INTRAVENOUS
  Filled 2015-05-08 (×9): qty 25

## 2015-05-08 MED ORDER — METOPROLOL SUCCINATE ER 50 MG PO TB24
50.0000 mg | ORAL_TABLET | Freq: Every day | ORAL | Status: DC
  Administered 2015-05-08 – 2015-05-15 (×8): 50 mg via ORAL
  Filled 2015-05-08 (×8): qty 1

## 2015-05-08 MED ORDER — DIPHENHYDRAMINE HCL 25 MG PO CAPS
50.0000 mg | ORAL_CAPSULE | Freq: Once | ORAL | Status: AC
  Administered 2015-05-08: 50 mg via ORAL
  Filled 2015-05-08: qty 2

## 2015-05-08 MED ORDER — DIPHENHYDRAMINE HCL 12.5 MG/5ML PO ELIX: 12.5000 mg | ORAL_SOLUTION | Freq: Four times a day (QID) | ORAL | Status: DC | PRN

## 2015-05-08 NOTE — ED Notes (Signed)
Pt c/o bilateral lateral rib pain onset "a couple days" ago. No CVA tenderness. No n/v/urinary symptoms.

## 2015-05-08 NOTE — H&P (Signed)
Hospital Admission Note Date: 05/08/2015  Patient name: [name] Medical record number: [id number] Date of birth: 1980/08/14 Age: 35 y.o. Gender: male PCP: [name], MD  Attending physician: [name], MD  Chief Complaint:Pain in ribs and ankled for several days  History of Present Illness: Opiate tolerant patient with Hb SS presents with c/o pain in ribs and ankles for several days.  He states that he was doing well and had a sudden increase in his pain which by yesterday he was unable to manage with his oral analgesics. He was seen in the [facility] and treated yesterday and reports that his pain was still uncontrolled and at 7-8/10.  He describes the pain as throbbing and intermittently sharp and localized to the ribs and ankles. He denies adn f/c, cough, increased SOB, N/V/D.   In the ED he received 3 doses of Dilaudid and pain is still present.  He reports that on arrival to the ED his pain was at 10/10 and after last dose of medication his pain is now 8-9/10. He has a CBC performed but reticulocyte and LDH are still pending. BMET from yesterday was w/in normal limits.   Scheduled Meds: .  HYDROmorphone (DILAUDID) injection  2.5 mg Intravenous Once  . morphine  30 mg Oral Q12H   Continuous Infusions:   PRN Meds:. Allergies: Review of patient's allergies indicates no known allergies. Past Medical History  Diagnosis Date  . Sickle cell anemia (HCC)   . Blood transfusion   . Acute embolism and thrombosis of right internal jugular vein ([place])   . Hypokalemia   . Mood disorder ([place])   . History of pulmonary embolus (PE)   . Avascular necrosis (HCC)     Right Hip  . Leukocytosis     Chronic  . Thrombocytosis (HCC)     Chronic  . Hypertension   . History of Clostridium difficile infection   . Uses marijuana   . Chronic anticoagulation   . Functional asplenia   . Former smoker   . Second hand tobacco smoke exposure   . Alcohol  consumption of one to four drinks per day   . Noncompliance with medication regimen   . Sickle-cell crisis with associated acute chest syndrome ([place]) 05/13/2013  . Acute chest syndrome ([place]) 06/18/2013  . Demand ischemia ([place]) 01/02/2014  . Pulmonary hypertension ([place])   . Hb-SS disease with crisis [facility])    Past Surgical History  Procedure Laterality Date  . Right hip replacement      08/2006  . Cholecystectomy      01/2008  . Porta cath placement    . Porta cath removal    . Umbilical hernia repair      01/2008  . Excision of left periauricular cyst      10/2009  . Excision of right ear lobe cyst with primary closur      11/2007  . Portacath placement  01/05/2012    Procedure: INSERTION PORT-A-CATH;  Surgeon: Odis Hollingshead, MD;  Location: [place];  Service: General;  Laterality: N/A;  ultrasound guiced port a cath insertion with fluoroscopy   Family History  Problem Relation Age of Onset  . Sickle cell trait Mother   . Depression Mother   . Diabetes Mother   . Sickle cell trait Father   . Sickle cell trait Brother    Social History   Social History  . Marital Status: Single    Spouse Name: N/A  .  Number of Children: 0  . Years of Education: 13   Occupational History  . 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 Smoking status: Former Smoker -- 13 years  . Smokeless tobacco: Never Used  . Alcohol Use: No  . Drug Use: 2.00 per week    Special: Marijuana     Comment: Marijuana weekly  . Sexual Activity: Not on file     Comment: month ago   Other Topics Concern  . Not on file   Social History Narrative   Lives in an apartment.  Single.  Lives alone but has a girlfriend that helps care for him.  Does not use any assist devices.        [name]  [phone] Mom, emergency contact      [facility]:   Patient continuing to live in her apartment in town alone. 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    Review of Systems: A comprehensive review of systems was negative except as noted in the HPI.  Physical Exam: No intake or output data in the 24 hours ending 05/08/15 1443 General: Alert, awake, oriented x3, in mild to moderate distress due to pain.  HEENT: [place]/AT PEERL, EOMI, anicteric Neck: Trachea midline,  no masses, no thyromegal,y no JVD, no carotid bruit OROPHARYNX:  Moist, No exudate/ erythema/lesions.  Heart: Regular rate and rhythm, without murmurs, rubs, gallops, PMI non-displaced, no heaves or thrills on palpation.  Lungs: Clear to auscultation, no wheezing or rhonchi noted. No increased vocal fremitus resonant to percussion. No egophony changes. Abdomen: Soft, nontender, nondistended, positive bowel sounds, no masses no hepatosplenomegaly noted. Neuro: No focal neurological deficits noted cranial nerves II through XII grossly intact. Strength at functional baseline in bilateral upper and lower extremities. Musculoskeletal: No warmth swelling or erythema around joints, no spinal tenderness noted. Psychiatric: Patient alert and oriented x3, good insight and cognition, good recent to remote recall.   Lab results:  Recent Labs  05/07/15 0940  NA 142  K 4.0  CL 111  CO2 23  GLUCOSE 111*  BUN 8  CREATININE 0.67  CALCIUM 9.3    Recent Labs  05/07/15 0940  AST 31  ALT 20  ALKPHOS 76  BILITOT 4.2*  PROT 7.7  ALBUMIN 4.3   No results for input(s): LIPASE, AMYLASE in the last 72 hours.  Recent Labs  05/07/15 0940 05/08/15 0952  WBC 17.7* 22.3*  NEUTROABS 12.7* PENDING  HGB 6.8* 6.7*  HCT 20.0* 19.7*  MCV 87.7 89.1  PLT 572* 561*   No results for input(s): CKTOTAL, CKMB, CKMBINDEX, TROPONINI in the last 72 hours. Invalid input(s): POCBNP No results for input(s): DDIMER in the last 72 hours. No results for input(s): HGBA1C in the last 72 hours. No results for input(s): CHOL, HDL, LDLCALC, TRIG, CHOLHDL, LDLDIRECT in the last 72 hours. No results for  input(s): TSH, T4TOTAL, T3FREE, THYROIDAB in the last 72 hours.  Invalid input(s): FREET3  Recent Labs  05/07/15 0940 05/08/15 0952  RETICCTPCT 10.8* 11.6*   Imaging results:  Dg Chest 2 View  05/07/2015  CLINICAL DATA:  Sickle cell pain crisis, chest pain EXAM: CHEST  2 VIEW COMPARISON:  04/26/2015 FINDINGS: Left subclavian power port catheter tip SVC RA junction. Stable cardiomegaly with vascular congestion and basilar atelectasis/ parenchymal scarring. No superimposed pneumonia, collapse or consolidation. No edema, effusion or pneumothorax. Trachea midline. IMPRESSION: Stable cardiomegaly with vascular congestion  and parenchymal scarring. No superimposed acute process. Electronically Signed   By: Jerilynn Mages.  Shick M.D.   On: 05/07/2015 09:05   Dg Chest 2 View  04/26/2015  CLINICAL DATA:  BILATERAL flank pain for 3 days. Sickle cell crisis. EXAM: CHEST  2 VIEW COMPARISON:  04/24/2015. FINDINGS: Cardiomegaly. Port-A-Cath unchanged, tip in RIGHT atrium. Mild vascular congestion. No infiltrates, or effusion. Sickle cell changes in the visualized osseous structures. IMPRESSION: Cardiomegaly.  No acute abnormality noted. Electronically Signed   By: Staci Righter M.D.   On: 04/26/2015 09:45   Dg Chest 2 View  04/24/2015  CLINICAL DATA:  Sickle cell crisis with chest pain, initial encounter EXAM: CHEST  2 VIEW COMPARISON:  03/19/2015 FINDINGS: Cardiac shadow is enlarged. A left chest wall port is again seen and stable. The lungs are well aerated bilaterally. No acute bony abnormality is seen. IMPRESSION: No acute abnormality noted. Electronically Signed   By: Inez Catalina M.D.   On: 04/24/2015 09:59     Assessment and Plan:  Hb SS with Crisis: Will start patient on individualized PCA, clinician assisted doses and Toradol. Will also give hypotonic IVF. Re-assess pain in 8-12 hours.  Leukocytosis: Pt has no "left shift" and no clinical signs of infection.  Anemia of chronic disease: Hb currently at 6.7  which is his baseline. However, I suspect some decrease in Hb with dilution with IVF.   Chronic Pain: Continue MS Contin.  Chronic Anticoagulation: Pt on Lovenox for recurrent PE. 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  Time spent 1 hour  Mayleigh Tetrault A. 05/08/2015, 2:43 PM

## 2015-05-08 NOTE — ED Provider Notes (Signed)
CSN: [id number]     Arrival date & time 05/08/15  0848 History   First MD Initiated Contact with Patient 05/08/15 [phone]     Chief Complaint  Patient presents with  . Sickle Cell Pain Crisis     (Consider location/radiation/quality/duration/timing/severity/associated sxs/prior Treatment) HPI Complains of left-sided rib pain typical for sickle cell crisis onset yesterday. Pain is constant nonradiating anterior nothing makes pain better or worse. Pain is nonexertional. No shortness of breath no nausea no sweatiness no cough no fever he treated himself with oral hydromorphone and MS Contin prior to coming here with partial relief though not adequate relief. Pain is moderate at present. No other associated symptoms. Patient was seen here yesterday, transferred to sickle cell clinic and he felt improved after discharge from clinic. States pain is moderate at present. Typical sickle cell pain. Past Medical History  Diagnosis Date  . Sickle cell anemia (HCC)   . Blood transfusion   . Acute embolism and thrombosis of right internal jugular vein ([place])   . Hypokalemia   . Mood disorder ([place])   . History of pulmonary embolus (PE)   . Avascular necrosis (HCC)     Right Hip  . Leukocytosis     Chronic  . Thrombocytosis (HCC)     Chronic  . Hypertension   . History of Clostridium difficile infection   . Uses marijuana   . Chronic anticoagulation   . Functional asplenia   . Former smoker   . Second hand tobacco smoke exposure   . Alcohol consumption of one to four drinks per day   . Noncompliance with medication regimen   . Sickle-cell crisis with associated acute chest syndrome ([place]) 05/13/2013  . Acute chest syndrome ([place]) 06/18/2013  . Demand ischemia ([place]) 01/02/2014  . Pulmonary hypertension ([place])   . 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 Right hip replacement      08/2006  . Cholecystectomy      01/2008  . Porta cath placement    .  Porta cath removal    . Umbilical hernia repair      01/2008  . Excision of left periauricular cyst      10/2009  . Excision of right ear lobe cyst with primary closur      11/2007  . Portacath placement  01/05/2012    Procedure: INSERTION PORT-A-CATH;  Surgeon: Odis Hollingshead, MD;  Location: [place];  Service: General;  Laterality: N/A;  ultrasound guiced port a cath insertion with fluoroscopy   Family History  Problem Relation Age of Onset  . Sickle cell trait Mother   . Depression Mother   . Diabetes Mother   . Sickle cell trait Father   . Sickle cell trait Brother    Social History  Substance Use Topics  . Smoking status: Former Smoker -- 13 years  . Smokeless tobacco: Never Used  . Alcohol Use: No    Review of Systems  Constitutional: Negative.   HENT: Negative.   Respiratory: Negative.   Cardiovascular: Positive for chest pain.  Gastrointestinal: Negative.   Musculoskeletal: Negative.   Skin: Negative.   Allergic/Immunologic: Positive for immunocompromised state.  Neurological: Negative.   Psychiatric/Behavioral: Negative.   All other systems reviewed and are negative.     Allergies  Review of patient's allergies indicates no known allergies.  Home Medications   Prior to Admission medications   Medication Sig Start Date End Date Taking? Authorizing  Provider  aspirin 81 MG chewable tablet Chew 1 tablet (81 mg total) by mouth daily. 07/23/14   [name], MD  Cholecalciferol (VITAMIN D) 2000 units tablet Take 1 tablet (2,000 Units total) by mouth daily. 02/23/15   Dorena Dew, FNP  cyclobenzaprine (FLEXERIL) 5 MG tablet Take 1 tablet (5 mg total) by mouth 3 (three) times daily as needed for muscle spasms. Patient not taking: Reported on 05/07/2015 02/19/15   Alfonzo Beers, MD  enoxaparin (LOVENOX) 120 MG/0.8ML injection Inject 0.8 mLs (120 mg total) into the skin daily. 12/03/14   [name], MD  folic acid (FOLVITE) 1 MG tablet Take 1 tablet  (1 mg total) by mouth every morning. 02/23/15   Dorena Dew, FNP  gabapentin (NEURONTIN) 300 MG capsule Take 1 capsule (300 mg total) by mouth 3 (three) times daily. Patient not taking: Reported on 05/07/2015 05/04/15   Dorena Dew, FNP  HYDROmorphone (DILAUDID) 4 MG tablet Take 1 tablet (4 mg total) by mouth every 4 (four) hours as needed for severe pain. 05/05/15   Dorena Dew, FNP  hydroxyurea (HYDREA) 500 MG capsule Take 1 capsule (500 mg total) by mouth daily. May take with food to minimize GI side effects. 11/06/14   Dorena Dew, FNP  lisinopril (PRINIVIL,ZESTRIL) 10 MG tablet Take 1 tablet (10 mg total) by mouth daily. 04/13/15   Dorena Dew, FNP  metoprolol succinate (TOPROL-XL) 50 MG 24 hr tablet Take 1 tablet (50 mg total) by mouth daily. 02/23/15   Dorena Dew, FNP  morphine (MS CONTIN) 30 MG 12 hr tablet Take 1 tablet (30 mg total) by mouth every 12 (twelve) hours. 05/05/15   Dorena Dew, FNP  potassium chloride SA (K-DUR,KLOR-CON) 20 MEQ tablet Take 1 tablet (20 mEq total) by mouth every morning. 04/13/15   Dorena Dew, FNP  zolpidem (AMBIEN) 10 MG tablet Take 1 tablet (10 mg total) by mouth at bedtime as needed for sleep. 05/05/15   Dorena Dew, FNP   BP 113/76 mmHg  Pulse 83  Temp(Src) 98.3 F (36.8 C) (Oral)  Resp 18  SpO2 95% Physical Exam  Constitutional: He appears well-developed and well-nourished.  HENT:  Head: Normocephalic and atraumatic.  Eyes: Conjunctivae are normal. Pupils are equal, round, and reactive to light.  Neck: Neck supple. No tracheal deviation present. No thyromegaly present.  Cardiovascular: Normal rate and regular rhythm.   No murmur heard. Pulmonary/Chest: Effort normal and breath sounds normal.  Abdominal: Soft. Bowel sounds are normal. He exhibits no distension. There is no tenderness.  Musculoskeletal: Normal range of motion. He exhibits no edema or tenderness.  Neurological: He is alert. Coordination normal.   Skin: Skin is warm and dry. No rash noted.  Psychiatric: He has a normal mood and affect.  Nursing note and vitals reviewed.   ED Course  Procedures (including critical care time) Labs Review Labs Reviewed - No data to display  Imaging Review Dg Chest 2 View  05/07/2015  CLINICAL DATA:  Sickle cell pain crisis, chest pain EXAM: CHEST  2 VIEW COMPARISON:  04/26/2015 FINDINGS: Left subclavian power port catheter tip SVC RA junction. Stable cardiomegaly with vascular congestion and basilar atelectasis/ parenchymal scarring. No superimposed pneumonia, collapse or consolidation. No edema, effusion or pneumothorax. Trachea midline. IMPRESSION: Stable cardiomegaly with vascular congestion and parenchymal scarring. No superimposed acute process. Electronically Signed   By: Jerilynn Mages.  Shick M.D.   On: 05/07/2015 09:05   I have personally reviewed  and evaluated these images and lab results as part of my medical decision-making.   EKG Interpretation None     1:15 PM patient remains with moderate to severe pain after treatment with multiple doses of intravenous opioids. He appears in no distress. Additional intravenous hydromorphone and IV Toradol ordered Results for orders placed or performed during the hospital encounter of 05/08/15  CBC  Result Value Ref Range   WBC 22.3 (H) 4.0 - 10.5 K/uL   RBC 2.21 (L) 4.22 - 5.81 MIL/uL   Hemoglobin 6.7 (LL) 13.0 - 17.0 g/dL   HCT 19.7 (L) 39.0 - 52.0 %   MCV 89.1 78.0 - 100.0 fL   MCH 30.3 26.0 - 34.0 pg   MCHC 34.0 30.0 - 36.0 g/dL   RDW 21.9 (H) 11.5 - 15.5 %   Platelets 561 (H) 150 - 400 K/uL  I-stat troponin, ED  Result Value Ref Range   Troponin i, poc 0.00 0.00 - 0.08 ng/mL   Comment 3           *Note: Due to a large number of results and/or encounters for the requested time period, some results have not been displayed. A complete set of results can be found in Results Review.   Dg Chest 2 View  05/07/2015  CLINICAL DATA:  Sickle cell pain crisis,  chest pain EXAM: CHEST  2 VIEW COMPARISON:  04/26/2015 FINDINGS: Left subclavian power port catheter tip SVC RA junction. Stable cardiomegaly with vascular congestion and basilar atelectasis/ parenchymal scarring. No superimposed pneumonia, collapse or consolidation. No edema, effusion or pneumothorax. Trachea midline. IMPRESSION: Stable cardiomegaly with vascular congestion and parenchymal scarring. No superimposed acute process. Electronically Signed   By: Jerilynn Mages.  Shick M.D.   On: 05/07/2015 09:05   Dg Chest 2 View  04/26/2015  CLINICAL DATA:  BILATERAL flank pain for 3 days. Sickle cell crisis. EXAM: CHEST  2 VIEW COMPARISON:  04/24/2015. FINDINGS: Cardiomegaly. Port-A-Cath unchanged, tip in RIGHT atrium. Mild vascular congestion. No infiltrates, or effusion. Sickle cell changes in the visualized osseous structures. IMPRESSION: Cardiomegaly.  No acute abnormality noted. Electronically Signed   By: Staci Righter M.D.   On: 04/26/2015 09:45   Dg Chest 2 View  04/24/2015  CLINICAL DATA:  Sickle cell crisis with chest pain, initial encounter EXAM: CHEST  2 VIEW COMPARISON:  03/19/2015 FINDINGS: Cardiac shadow is enlarged. A left chest wall port is again seen and stable. The lungs are well aerated bilaterally. No acute bony abnormality is seen. IMPRESSION: No acute abnormality noted. Electronically Signed   By: Inez Catalina M.D.   On: 04/24/2015 09:59    MDM  Patient reports his baseline hemoglobin is 6.5. Final diagnoses:  None  Dr. Zigmund Daniel consulted and will arrange for admission. Diagnosis sickle cell crisis with intractable pain     Orlie Dakin, MD 05/08/15 1621

## 2015-05-08 NOTE — Telephone Encounter (Signed)
Spoke with patient and informed him that [place], NP has recommended that her report to the ED for treatment.

## 2015-05-08 NOTE — Telephone Encounter (Signed)
Patient called reporting pain of 9/10 on his bilateral lower ribs.  He took his pain medication at 0500 with no relief.  Patient denies fever, chest pain, nausea/vomiting, diarrhea, abdominal pain, and priapism.

## 2015-05-08 NOTE — ED Notes (Addendum)
Pt O2 sat at 89%RA.  Pt put on 2L [place]. Pt requesting more pain medication and benadryl for itching.  MD made aware.

## 2015-05-09 LAB — COMPREHENSIVE METABOLIC PANEL
ALK PHOS: 79 U/L (ref 38–126)
ALT: 20 U/L (ref 17–63)
AST: 25 U/L (ref 15–41)
Albumin: 4.4 g/dL (ref 3.5–5.0)
Anion gap: 7 (ref 5–15)
BUN: 18 mg/dL (ref 6–20)
CALCIUM: 8.8 mg/dL — AB (ref 8.9–10.3)
CHLORIDE: 108 mmol/L (ref 101–111)
CO2: 23 mmol/L (ref 22–32)
CREATININE: 0.85 mg/dL (ref 0.61–1.24)
Glucose, Bld: 124 mg/dL — ABNORMAL HIGH (ref 65–99)
Potassium: 4.6 mmol/L (ref 3.5–5.1)
Sodium: 138 mmol/L (ref 135–145)
TOTAL PROTEIN: 7.8 g/dL (ref 6.5–8.1)
Total Bilirubin: 3.2 mg/dL — ABNORMAL HIGH (ref 0.3–1.2)

## 2015-05-09 LAB — CBC WITH DIFFERENTIAL/PLATELET
BASOS PCT: 1 %
Basophils Absolute: 0.2 10*3/uL — ABNORMAL HIGH (ref 0.0–0.1)
EOS PCT: 3 %
Eosinophils Absolute: 0.7 10*3/uL (ref 0.0–0.7)
HCT: 18.7 % — ABNORMAL LOW (ref 39.0–52.0)
Hemoglobin: 6.6 g/dL — CL (ref 13.0–17.0)
Lymphocytes Relative: 19 %
Lymphs Abs: 4.2 10*3/uL — ABNORMAL HIGH (ref 0.7–4.0)
MCH: 31 pg (ref 26.0–34.0)
MCHC: 35.3 g/dL (ref 30.0–36.0)
MCV: 87.8 fL (ref 78.0–100.0)
MONO ABS: 2.7 10*3/uL — AB (ref 0.1–1.0)
Monocytes Relative: 12 %
NEUTROS PCT: 65 %
Neutro Abs: 14.3 10*3/uL — ABNORMAL HIGH (ref 1.7–7.7)
PLATELETS: 511 10*3/uL — AB (ref 150–400)
RBC: 2.13 MIL/uL — ABNORMAL LOW (ref 4.22–5.81)
RDW: 22 % — ABNORMAL HIGH (ref 11.5–15.5)
WBC: 22.1 10*3/uL — ABNORMAL HIGH (ref 4.0–10.5)

## 2015-05-09 LAB — RETICULOCYTES
RBC.: 2.13 MIL/uL — AB (ref 4.22–5.81)
RETIC CT PCT: 15.2 % — AB (ref 0.4–3.1)
Retic Count, Absolute: 323.8 10*3/uL — ABNORMAL HIGH (ref 19.0–186.0)

## 2015-05-09 MED ORDER — DIPHENHYDRAMINE HCL 50 MG/ML IJ SOLN
12.5000 mg | Freq: Once | INTRAMUSCULAR | Status: AC
  Administered 2015-05-09: 12.5 mg via INTRAVENOUS
  Filled 2015-05-09: qty 1

## 2015-05-09 MED ORDER — SODIUM CHLORIDE 0.9 % IV SOLN
12.5000 mg | Freq: Once | INTRAVENOUS | Status: AC
  Administered 2015-05-09: 12.5 mg via INTRAVENOUS
  Filled 2015-05-09: qty 0.25

## 2015-05-09 MED ORDER — DIPHENHYDRAMINE HCL 25 MG PO CAPS
25.0000 mg | ORAL_CAPSULE | ORAL | Status: DC | PRN
  Administered 2015-05-10 – 2015-05-13 (×4): 25 mg via ORAL
  Filled 2015-05-09 (×4): qty 1

## 2015-05-09 NOTE — Progress Notes (Signed)
SICKLE CELL SERVICE PROGRESS NOTE  [name] [id number] DOB: 07/06/1980 DOA: 05/08/2015 PCP: [name], MD  Assessment/Plan: Active Problems:   Hb-SS disease with crisis ([place])   Sickle cell crisis ([place])  1. Hb SS with crisis: Will continue current regimen- PCA, Toradol, Neurontin and clinician assisted doses of Dilaudid PRN. Will re-assess pain after he has had 24 hours of treatment. 2. Leukocytosis: No evidence of infection. Likely related to bone marrow activity. Labs pending from today. 3. Anemia of chronic disease: Labs pending from today. Expect Hb to be more stable as Hydrea increased to more therapeutic dose.  4. Chronic pain: Continue MS Contin   Code Status: Full Code Family Communication: N/A Disposition Plan: Not yet ready for discharge  [place].  Pager [phone]. If 7PM-7AM, please contact night-coverage.  05/09/2015, 9:55 AM  LOS: 1 day   Interim History: Pt reports pain as 7-8/10 and localized to ribs. He has used 30.5 mg with 59/57: Demands/Deliveries.   Consultants:  None  Procedures:  None  Antibiotics:  None   Objective: Filed Vitals:   05/09/15 0240 05/09/15 0425 05/09/15 0433 05/09/15 0757  BP: 130/74  115/76   Pulse: 84  85   Temp: 99.2 F (37.3 C)  98.7 F (37.1 C)   TempSrc: Oral  Oral   Resp: 22 17 17 14   Height:      Weight:   173 lb 1.6 oz (78.518 kg)   SpO2: 92% 95% 94% 98%   Weight change:   Intake/Output Summary (Last 24 hours) at 05/09/15 0955 Last data filed at 05/09/15 0546  Gross per 24 hour  Intake 1631.2 ml  Output    700 ml  Net  931.2 ml    General: Alert, awake, oriented x3, in moderate distress.  HEENT: [place]/AT PEERL, EOMI, anicteric Neck: Trachea midline,  no masses, no thyromegal,y no JVD, no carotid bruit OROPHARYNX:  Moist, No exudate/ erythema/lesions.  Heart: Regular rate and rhythm, without murmurs, rubs, gallops, PMI non-displaced, no heaves or thrills on palpation.  Lungs:  Clear to auscultation, no wheezing or rhonchi noted. No increased vocal fremitus resonant to percussion  Abdomen: Soft, nontender, nondistended, positive bowel sounds, no masses no hepatosplenomegaly noted..  Neuro: No focal neurological deficits noted cranial nerves II through XII grossly intact.  Strength at functional baseline in bilateral upper and lower extremities. Musculoskeletal: No warm swelling or erythema around joints, no spinal tenderness noted. Psychiatric: Patient alert and oriented x3, good insight and cognition, good recent to remote recall.    Data Reviewed: Basic Metabolic Panel:  Recent Labs Lab 05/05/15 1107 05/07/15 0940  NA 141 142  K 4.2 4.0  CL 111 111  CO2 22 23  GLUCOSE 104* 111*  BUN 10 8  CREATININE 0.75 0.67  CALCIUM 9.3 9.3   Liver Function Tests:  Recent Labs Lab 05/05/15 1107 05/07/15 0940  AST 29 31  ALT 21 20  ALKPHOS 82 76  BILITOT 3.7* 4.2*  PROT 8.1 7.7  ALBUMIN 4.7 4.3   No results for input(s): LIPASE, AMYLASE in the last 168 hours. No results for input(s): AMMONIA in the last 168 hours. CBC:  Recent Labs Lab 05/05/15 1107 05/07/15 0940 05/08/15 0952  WBC 18.1* 17.7* 22.3*  NEUTROABS 13.5* 12.7* 16.5*  HGB 6.9* 6.8* 6.7*  HCT 20.4* 20.0* 19.7*  MCV 88.3 87.7 89.1  PLT 605* 572* 561*   Cardiac Enzymes: No results for input(s): CKTOTAL, CKMB, CKMBINDEX, TROPONINI in the last 168 hours. BNP (last  3 results)  Recent Labs  08/29/14 0810 09/10/14 0612 11/30/14 0910  BNP 1117.0* 549.5* 639.7*    ProBNP (last 3 results) No results for input(s): PROBNP in the last 8760 hours.  CBG: No results for input(s): GLUCAP in the last 168 hours.  Recent Results (from the past 240 hour(s))  MRSA PCR Screening     Status: None   Collection Time: 05/08/15  6:55 PM  Result Value Ref Range Status   MRSA by PCR NEGATIVE NEGATIVE Final    Comment:        The GeneXpert MRSA Assay (FDA approved for NASAL specimens only), is  one component of a comprehensive MRSA colonization surveillance program. It is not intended to diagnose MRSA infection nor to guide or monitor treatment for MRSA infections.      Studies: Dg Chest 2 View  05/07/2015  CLINICAL DATA:  Sickle cell pain crisis, chest pain EXAM: CHEST  2 VIEW COMPARISON:  04/26/2015 FINDINGS: Left subclavian power port catheter tip SVC RA junction. Stable cardiomegaly with vascular congestion and basilar atelectasis/ parenchymal scarring. No superimposed pneumonia, collapse or consolidation. No edema, effusion or pneumothorax. Trachea midline. IMPRESSION: Stable cardiomegaly with vascular congestion and parenchymal scarring. No superimposed acute process. Electronically Signed   By: Jerilynn Mages.  Shick M.D.   On: 05/07/2015 09:05   Dg Chest 2 View  04/26/2015  CLINICAL DATA:  BILATERAL flank pain for 3 days. Sickle cell crisis. EXAM: CHEST  2 VIEW COMPARISON:  04/24/2015. FINDINGS: Cardiomegaly. Port-A-Cath unchanged, tip in RIGHT atrium. Mild vascular congestion. No infiltrates, or effusion. Sickle cell changes in the visualized osseous structures. IMPRESSION: Cardiomegaly.  No acute abnormality noted. Electronically Signed   By: Staci Righter M.D.   On: 04/26/2015 09:45   Dg Chest 2 View  04/24/2015  CLINICAL DATA:  Sickle cell crisis with chest pain, initial encounter EXAM: CHEST  2 VIEW COMPARISON:  03/19/2015 FINDINGS: Cardiac shadow is enlarged. A left chest wall port is again seen and stable. The lungs are well aerated bilaterally. No acute bony abnormality is seen. IMPRESSION: No acute abnormality noted. Electronically Signed   By: Inez Catalina M.D.   On: 04/24/2015 09:59    Scheduled Meds: . aspirin  81 mg Oral Daily  . cholecalciferol  2,000 Units Oral Daily  . enoxaparin  120 mg Subcutaneous Q24H  . folic acid  1 mg Oral q morning - 10a  . gabapentin  300 mg Oral TID  . HYDROmorphone   Intravenous 6 times per day  . hydroxyurea  1,000 mg Oral Daily  .  ketorolac  30 mg Intravenous 4 times per day  . lisinopril  10 mg Oral Daily  . metoprolol succinate  50 mg Oral Daily  . morphine  30 mg Oral Q12H  . potassium chloride SA  20 mEq Oral q morning - 10a  . senna-docusate  1 tablet Oral BID   Continuous Infusions: . dextrose 5 % and 0.45% NaCl 100 mL/hr at 05/09/15 0141    Time spent 30 minurtes

## 2015-05-10 DIAGNOSIS — Z7901 Long term (current) use of anticoagulants: Secondary | ICD-10-CM

## 2015-05-10 LAB — CBC WITH DIFFERENTIAL/PLATELET
BASOS ABS: 0.1 10*3/uL (ref 0.0–0.1)
BASOS PCT: 1 %
EOS ABS: 0.7 10*3/uL (ref 0.0–0.7)
EOS PCT: 4 %
HCT: 17.8 % — ABNORMAL LOW (ref 39.0–52.0)
Hemoglobin: 6.1 g/dL — CL (ref 13.0–17.0)
LYMPHS PCT: 16 %
Lymphs Abs: 2.9 10*3/uL (ref 0.7–4.0)
MCH: 30.3 pg (ref 26.0–34.0)
MCHC: 34.3 g/dL (ref 30.0–36.0)
MCV: 88.6 fL (ref 78.0–100.0)
MONO ABS: 2.1 10*3/uL — AB (ref 0.1–1.0)
Monocytes Relative: 11 %
Neutro Abs: 12.9 10*3/uL — ABNORMAL HIGH (ref 1.7–7.7)
Neutrophils Relative %: 69 %
PLATELETS: 490 10*3/uL — AB (ref 150–400)
RBC: 2.01 MIL/uL — AB (ref 4.22–5.81)
RDW: 21.3 % — AB (ref 11.5–15.5)
WBC: 18.8 10*3/uL — AB (ref 4.0–10.5)

## 2015-05-10 LAB — RETICULOCYTES
RBC.: 2.01 MIL/uL — AB (ref 4.22–5.81)
RETIC COUNT ABSOLUTE: 231.2 10*3/uL — AB (ref 19.0–186.0)
RETIC CT PCT: 11.5 % — AB (ref 0.4–3.1)

## 2015-05-10 LAB — PREPARE RBC (CROSSMATCH)

## 2015-05-10 LAB — LACTATE DEHYDROGENASE: LDH: 343 U/L — AB (ref 98–192)

## 2015-05-10 MED ORDER — HYDROMORPHONE HCL 2 MG/ML IJ SOLN
2.0000 mg | INTRAMUSCULAR | Status: AC | PRN
  Administered 2015-05-10 – 2015-05-12 (×24): 2 mg via INTRAVENOUS
  Filled 2015-05-10 (×24): qty 1

## 2015-05-10 MED ORDER — SODIUM CHLORIDE 0.9 % IV SOLN: Freq: Once | INTRAVENOUS | Status: DC

## 2015-05-10 NOTE — Progress Notes (Signed)
Utilization review completed.  

## 2015-05-10 NOTE — Progress Notes (Addendum)
SICKLE CELL SERVICE PROGRESS NOTE  [name] [id number] DOB: 06/07/1980 DOA: 05/08/2015 PCP: [name], MD  Assessment/Plan: Active Problems:   Hb-SS disease with crisis ([place])   Sickle cell crisis ([place])  1. Hb SS with crisis: Will continue current regimen- PCA, Toradol, Neurontin and increase clinician assisted doses of Dilaudid PRN to frequency of Q2 hours. Will re-assess pain control in 24 hours. 2. Leukocytosis: No evidence of infection. Likely related to bone marrow activity.  3. Anemia of chronic disease: Labs show evidence of acute hemolysis. Will continue monitoring and continue Hydrea for now. 4. Chronic Respiratory Failure with Hypoxia: Currently below baseline. Will encourage incentive spirometer. If still relatively hypoxic will consider transfusion of 1 unit RBC's.  5. Chronic pain: Continue MS Contin   Code Status: Full Code Family Communication: N/A Disposition Plan: Not yet ready for discharge  [place].  Pager [phone]. If 7PM-7AM, please contact night-coverage.  05/10/2015, 9:14 AM  LOS: 2 days   Interim History: Pt reports pain as 8/10 and localized to ribs. He has used 24 mg with 48/48: Demands/Deliveries.   Consultants:  None  Procedures:  None  Antibiotics:  None    Objective: Filed Vitals:   05/10/15 0244 05/10/15 0409 05/10/15 0539 05/10/15 0751  BP:   132/80   Pulse:   80   Temp:   98.2 F (36.8 C)   TempSrc:   Oral   Resp: 14 20 22 17   Height:      Weight:   172 lb 14.4 oz (78.427 kg)   SpO2: 95% 91% 94% 96%   Weight change: 14.4 oz (0.408 kg)  Intake/Output Summary (Last 24 hours) at 05/10/15 0914 Last data filed at 05/10/15 0552  Gross per 24 hour  Intake 3872.5 ml  Output   2925 ml  Net  947.5 ml    General: Alert, awake, oriented x3, in moderate distress secondary to pain.  HEENT: [place]/AT PEERL, EOMI, anicteric Neck: Trachea midline,  no masses, no thyromegal,y no JVD, no carotid  bruit OROPHARYNX:  Moist, No exudate/ erythema/lesions.  Heart: Regular rate and rhythm, without murmurs, rubs, gallops, PMI non-displaced, no heaves or thrills on palpation.  Lungs: Clear to auscultation, no wheezing or rhonchi noted. No increased vocal fremitus resonant to percussion  Abdomen: Soft, nontender, nondistended, positive bowel sounds, no masses no hepatosplenomegaly noted.  Neuro: No focal neurological deficits noted cranial nerves II through XII grossly intact.  Strength at functional baseline in bilateral upper and lower extremities. Musculoskeletal: No warmth swelling or erythema around joints, no spinal tenderness noted. Psychiatric: Patient alert and oriented x3, good insight and cognition, good recent to remote recall.    Data Reviewed: Basic Metabolic Panel:  Recent Labs Lab 05/05/15 1107 05/07/15 0940 05/09/15 0900  NA 141 142 138  K 4.2 4.0 4.6  CL 111 111 108  CO2 22 23 23   GLUCOSE 104* 111* 124*  BUN 10 8 18   CREATININE 0.75 0.67 0.85  CALCIUM 9.3 9.3 8.8*   Liver Function Tests:  Recent Labs Lab 05/05/15 1107 05/07/15 0940 05/09/15 0900  AST 29 31 25   ALT 21 20 20   ALKPHOS 82 76 79  BILITOT 3.7* 4.2* 3.2*  PROT 8.1 7.7 7.8  ALBUMIN 4.7 4.3 4.4   No results for input(s): LIPASE, AMYLASE in the last 168 hours. No results for input(s): AMMONIA in the last 168 hours. CBC:  Recent Labs Lab 05/05/15 1107 05/07/15 0940 05/08/15 0952 05/09/15 0900  WBC 18.1* 17.7* 22.3* 22.1*  NEUTROABS 13.5* 12.7* 16.5* 14.3*  HGB 6.9* 6.8* 6.7* 6.6*  HCT 20.4* 20.0* 19.7* 18.7*  MCV 88.3 87.7 89.1 87.8  PLT 605* 572* 561* 511*   Cardiac Enzymes: No results for input(s): CKTOTAL, CKMB, CKMBINDEX, TROPONINI in the last 168 hours. BNP (last 3 results)  Recent Labs  08/29/14 0810 09/10/14 0612 11/30/14 0910  BNP 1117.0* 549.5* 639.7*    ProBNP (last 3 results) No results for input(s): PROBNP in the last 8760 hours.  CBG: No results for  input(s): GLUCAP in the last 168 hours.  Recent Results (from the past 240 hour(s))  MRSA PCR Screening     Status: None   Collection Time: 05/08/15  6:55 PM  Result Value Ref Range Status   MRSA by PCR NEGATIVE NEGATIVE Final    Comment:        The GeneXpert MRSA Assay (FDA approved for NASAL specimens only), is one component of a comprehensive MRSA colonization surveillance program. It is not intended to diagnose MRSA infection nor to guide or monitor treatment for MRSA infections.      Studies: Dg Chest 2 View  05/07/2015  CLINICAL DATA:  Sickle cell pain crisis, chest pain EXAM: CHEST  2 VIEW COMPARISON:  04/26/2015 FINDINGS: Left subclavian power port catheter tip SVC RA junction. Stable cardiomegaly with vascular congestion and basilar atelectasis/ parenchymal scarring. No superimposed pneumonia, collapse or consolidation. No edema, effusion or pneumothorax. Trachea midline. IMPRESSION: Stable cardiomegaly with vascular congestion and parenchymal scarring. No superimposed acute process. Electronically Signed   By: Jerilynn Mages.  Shick M.D.   On: 05/07/2015 09:05   Dg Chest 2 View  04/26/2015  CLINICAL DATA:  BILATERAL flank pain for 3 days. Sickle cell crisis. EXAM: CHEST  2 VIEW COMPARISON:  04/24/2015. FINDINGS: Cardiomegaly. Port-A-Cath unchanged, tip in RIGHT atrium. Mild vascular congestion. No infiltrates, or effusion. Sickle cell changes in the visualized osseous structures. IMPRESSION: Cardiomegaly.  No acute abnormality noted. Electronically Signed   By: Staci Righter M.D.   On: 04/26/2015 09:45   Dg Chest 2 View  04/24/2015  CLINICAL DATA:  Sickle cell crisis with chest pain, initial encounter EXAM: CHEST  2 VIEW COMPARISON:  03/19/2015 FINDINGS: Cardiac shadow is enlarged. A left chest wall port is again seen and stable. The lungs are well aerated bilaterally. No acute bony abnormality is seen. IMPRESSION: No acute abnormality noted. Electronically Signed   By: Inez Catalina M.D.    On: 04/24/2015 09:59    Scheduled Meds: . aspirin  81 mg Oral Daily  . cholecalciferol  2,000 Units Oral Daily  . enoxaparin  120 mg Subcutaneous Q24H  . folic acid  1 mg Oral q morning - 10a  . gabapentin  300 mg Oral TID  . HYDROmorphone   Intravenous 6 times per day  . hydroxyurea  1,000 mg Oral Daily  . ketorolac  30 mg Intravenous 4 times per day  . lisinopril  10 mg Oral Daily  . metoprolol succinate  50 mg Oral Daily  . morphine  30 mg Oral Q12H  . potassium chloride SA  20 mEq Oral q morning - 10a  . senna-docusate  1 tablet Oral BID   Continuous Infusions: . dextrose 5 % and 0.45% NaCl 100 mL/hr at 05/09/15 2329    Time spent 25 minurtes

## 2015-05-11 ENCOUNTER — Ambulatory Visit: Payer: Self-pay | Admitting: *Deleted

## 2015-05-11 NOTE — Progress Notes (Addendum)
SICKLE CELL SERVICE PROGRESS NOTE  [name] [id number] DOB: 04-24-1980 DOA: 05/08/2015 PCP: [name], MD  Assessment/Plan: Active Problems:   Hb-SS disease with crisis ([name])   Sickle cell crisis ([name])  1. Hb SS with crisis: Pt rates pain at 7/10 and localized to ribs and now jaw. Will continue current regimen- PCA, Toradol, Neurontin and clinician assisted doses of Dilaudid PRN Q2 hours.  2. Right Infraorbital swelling: Pt has some painless right infraorbital swelling which is painless and has no elements of infection. He has a pimple at the lateral distribution of the swelling which appears non-infected. This may be just a reflection of fluid distribution associated with positioning. Will observe. 3. Leukocytosis: No evidence of infection. Likely related to bone marrow activity.  4. Anemia of chronic disease: Labs show evidence of acute hemolysis. Will continue monitoring and continue Hydrea for now. 5. Chronic Respiratory Failure with Hypoxia: Currently at baseline. Will encourage continued  incentive spirometer use. If still relatively hypoxic will consider transfusion of 1 unit RBC's.  6. 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  7. Chronic pain: Continue MS Contin   Code Status: Full Code Family Communication: N/A Disposition Plan: Not yet ready for discharge  [place].  Pager [phone]. If 7PM-7AM, please contact night-coverage.  05/11/2015, 11:19 AM  LOS: 3 days   Interim History: Pt reports pain as 7/10 and localized to ribs and jaw. He has used 29 mg with 52/58: Demands/Deliveries.   Consultants:  None  Procedures:  None  Antibiotics:  None    Objective: Filed Vitals:   05/11/15 0429 05/11/15 0543 05/11/15 0736 05/11/15 1006  BP:  116/79  123/89  Pulse:  84  77  Temp:  99.2 F (37.3 C)  98.5 F (36.9 C)  TempSrc:  Oral  Oral  Resp: 18 14 16 16   Height:      Weight:  178 lb  2.1 oz (80.8 kg)    SpO2: 93% 93% 94% 92%   Weight change: 5 lb 3.7 oz (2.373 kg)  Intake/Output Summary (Last 24 hours) at 05/11/15 1119 Last data filed at 05/11/15 1006  Gross per 24 hour  Intake 3337.7 ml  Output   2345 ml  Net  992.7 ml    General: Alert, awake, oriented x3, in mild distress secondary to pain.  HEENT: [place]/AT PEERL, EOMI, anicteric Neck: Trachea midline,  no masses, no thyromegal,y no JVD, no carotid bruit OROPHARYNX:  Moist, No exudate/ erythema/lesions.  Heart: Regular rate and rhythm, without murmurs, rubs, gallops, PMI non-displaced, no heaves or thrills on palpation.  Lungs: Clear to auscultation, no wheezing or rhonchi noted. No increased vocal fremitus resonant to percussion  Abdomen: Soft, nontender, nondistended, positive bowel sounds, no masses no hepatosplenomegaly noted.  Neuro: No focal neurological deficits noted cranial nerves II through XII grossly intact.  Strength at functional baseline in bilateral upper and lower extremities. Musculoskeletal: No warmth swelling or erythema around joints, no spinal tenderness noted. Psychiatric: Patient alert and oriented x3, good insight and cognition, good recent to remote recall.    Data Reviewed: Basic Metabolic Panel:  Recent Labs Lab 05/05/15 1107 05/07/15 0940 05/09/15 0900  NA 141 142 138  K 4.2 4.0 4.6  CL 111 111 108  CO2 22 23 23   GLUCOSE 104* 111* 124*  BUN 10 8 18   CREATININE 0.75 0.67 0.85  CALCIUM 9.3 9.3 8.8*   Liver Function Tests:  Recent Labs Lab 05/05/15 1107 05/07/15 0940 05/09/15  0900  AST 29 31 25   ALT 21 20 20   ALKPHOS 82 76 79  BILITOT 3.7* 4.2* 3.2*  PROT 8.1 7.7 7.8  ALBUMIN 4.7 4.3 4.4   No results for input(s): LIPASE, AMYLASE in the last 168 hours. No results for input(s): AMMONIA in the last 168 hours. CBC:  Recent Labs Lab 05/05/15 1107 05/07/15 0940 05/08/15 0952 05/09/15 0900 05/10/15 1216  WBC 18.1* 17.7* 22.3* 22.1* 18.8*  NEUTROABS 13.5*  12.7* 16.5* 14.3* 12.9*  HGB 6.9* 6.8* 6.7* 6.6* 6.1*  HCT 20.4* 20.0* 19.7* 18.7* 17.8*  MCV 88.3 87.7 89.1 87.8 88.6  PLT 605* 572* 561* 511* 490*   Cardiac Enzymes: No results for input(s): CKTOTAL, CKMB, CKMBINDEX, TROPONINI in the last 168 hours. BNP (last 3 results)  Recent Labs  08/29/14 0810 09/10/14 0612 11/30/14 0910  BNP 1117.0* 549.5* 639.7*    ProBNP (last 3 results) No results for input(s): PROBNP in the last 8760 hours.  CBG: No results for input(s): GLUCAP in the last 168 hours.  Recent Results (from the past 240 hour(s))  MRSA PCR Screening     Status: None   Collection Time: 05/08/15  6:55 PM  Result Value Ref Range Status   MRSA by PCR NEGATIVE NEGATIVE Final    Comment:        The GeneXpert MRSA Assay (FDA approved for NASAL specimens only), is one component of a comprehensive MRSA colonization surveillance program. It is not intended to diagnose MRSA infection nor to guide or monitor treatment for MRSA infections.      Studies: Dg Chest 2 View  05/07/2015  CLINICAL DATA:  Sickle cell pain crisis, chest pain EXAM: CHEST  2 VIEW COMPARISON:  04/26/2015 FINDINGS: Left subclavian power port catheter tip SVC RA junction. Stable cardiomegaly with vascular congestion and basilar atelectasis/ parenchymal scarring. No superimposed pneumonia, collapse or consolidation. No edema, effusion or pneumothorax. Trachea midline. IMPRESSION: Stable cardiomegaly with vascular congestion and parenchymal scarring. No superimposed acute process. Electronically Signed   By: Jerilynn Mages.  Shick M.D.   On: 05/07/2015 09:05   Dg Chest 2 View  04/26/2015  CLINICAL DATA:  BILATERAL flank pain for 3 days. Sickle cell crisis. EXAM: CHEST  2 VIEW COMPARISON:  04/24/2015. FINDINGS: Cardiomegaly. Port-A-Cath unchanged, tip in RIGHT atrium. Mild vascular congestion. No infiltrates, or effusion. Sickle cell changes in the visualized osseous structures. IMPRESSION: Cardiomegaly.  No acute  abnormality noted. Electronically Signed   By: Staci Righter M.D.   On: 04/26/2015 09:45   Dg Chest 2 View  04/24/2015  CLINICAL DATA:  Sickle cell crisis with chest pain, initial encounter EXAM: CHEST  2 VIEW COMPARISON:  03/19/2015 FINDINGS: Cardiac shadow is enlarged. A left chest wall port is again seen and stable. The lungs are well aerated bilaterally. No acute bony abnormality is seen. IMPRESSION: No acute abnormality noted. Electronically Signed   By: Inez Catalina M.D.   On: 04/24/2015 09:59    Scheduled Meds: . sodium chloride   Intravenous Once  . aspirin  81 mg Oral Daily  . cholecalciferol  2,000 Units Oral Daily  . enoxaparin  120 mg Subcutaneous Q24H  . folic acid  1 mg Oral q morning - 10a  . gabapentin  300 mg Oral TID  . HYDROmorphone   Intravenous 6 times per day  . hydroxyurea  1,000 mg Oral Daily  . ketorolac  30 mg Intravenous 4 times per day  . lisinopril  10 mg Oral Daily  . metoprolol succinate  50 mg Oral Daily  . morphine  30 mg Oral Q12H  . potassium chloride SA  20 mEq Oral q morning - 10a  . senna-docusate  1 tablet Oral BID   Continuous Infusions: . dextrose 5 % and 0.45% NaCl 10 mL/hr at 05/10/15 0912    Time spent 25 minurtes

## 2015-05-11 NOTE — Care Management Important Message (Signed)
Important Message  Patient Details Important Message  Patient Details  Name: [name] MRN: [id number] Date of Birth: [birth year]   Medicare Important Message Given:  Yes    [name] 05/11/2015, 10:55 AM Name: [name] MRN: [id number] Date of Birth: [birth year]   Medicare Important Message Given:  Yes    [name] 05/11/2015, 10:55 AMImportant Message  Patient Details  Name: [name] MRN: [id number] Date of Birth: 01/04/1981   Medicare Important Message Given:  Yes    [name] 05/11/2015, 10:54 AM

## 2015-05-12 LAB — BASIC METABOLIC PANEL
ANION GAP: 6 (ref 5–15)
BUN: 15 mg/dL (ref 6–20)
CHLORIDE: 114 mmol/L — AB (ref 101–111)
CO2: 22 mmol/L (ref 22–32)
CREATININE: 0.76 mg/dL (ref 0.61–1.24)
Calcium: 9.2 mg/dL (ref 8.9–10.3)
GFR calc non Af Amer: >60 mL/min (ref >60)
Glucose, Bld: 119 mg/dL — ABNORMAL HIGH (ref 65–99)
POTASSIUM: 4.3 mmol/L (ref 3.5–5.1)
SODIUM: 142 mmol/L (ref 135–145)

## 2015-05-12 LAB — CBC WITH DIFFERENTIAL/PLATELET
BASOS ABS: 0 10*3/uL (ref 0.0–0.1)
BASOS PCT: 0 %
EOS ABS: 0.3 10*3/uL (ref 0.0–0.7)
Eosinophils Relative: 2 %
HCT: 18.1 % — ABNORMAL LOW (ref 39.0–52.0)
Hemoglobin: 6.3 g/dL — CL (ref 13.0–17.0)
LYMPHS PCT: 16 %
Lymphs Abs: 2.6 10*3/uL (ref 0.7–4.0)
MCH: 30.1 pg (ref 26.0–34.0)
MCHC: 34.8 g/dL (ref 30.0–36.0)
MCV: 86.6 fL (ref 78.0–100.0)
MONO ABS: 2.6 10*3/uL — AB (ref 0.1–1.0)
Monocytes Relative: 16 %
NEUTROS PCT: 66 %
Neutro Abs: 10.5 10*3/uL — ABNORMAL HIGH (ref 1.7–7.7)
PLATELETS: 474 10*3/uL — AB (ref 150–400)
RBC: 2.09 MIL/uL — ABNORMAL LOW (ref 4.22–5.81)
RDW: 21.5 % — AB (ref 11.5–15.5)
WBC: 16 10*3/uL — ABNORMAL HIGH (ref 4.0–10.5)

## 2015-05-12 LAB — RETICULOCYTES
RBC.: 2.09 MIL/uL — ABNORMAL LOW (ref 4.22–5.81)
RETIC CT PCT: 10.7 % — AB (ref 0.4–3.1)
Retic Count, Absolute: 223.6 10*3/uL — ABNORMAL HIGH (ref 19.0–186.0)

## 2015-05-12 MED ORDER — HYDROMORPHONE HCL 2 MG/ML IJ SOLN
2.0000 mg | INTRAMUSCULAR | Status: DC | PRN
  Administered 2015-05-13 (×2): 2 mg via INTRAVENOUS
  Filled 2015-05-12 (×2): qty 1

## 2015-05-12 MED ORDER — HYDROMORPHONE HCL 2 MG/ML IJ SOLN
2.0000 mg | INTRAMUSCULAR | Status: AC
  Administered 2015-05-12 – 2015-05-13 (×5): 2 mg via INTRAVENOUS
  Filled 2015-05-12 (×5): qty 1

## 2015-05-12 NOTE — Progress Notes (Signed)
SICKLE CELL SERVICE PROGRESS NOTE  DERIC VIETMEIER [id number] DOB: Oct 22, 1980 DOA: 05/08/2015 PCP: [name], MD  Assessment/Plan: Active Problems:   Hb-SS disease with crisis ([place])   Sickle cell crisis ([place])  1. Hb SS with crisis: Pt rates pain at 7-8/10 and localized to ribs and now jaw. Will schedule clinician assisted doses during the night and resume as needed in the morning as his pain escalates overnight and is at increased intensity in the morning upon waking. Continue current regimen- PCA, Toradol andNeurontin. 2. Right Infraorbital swelling:Resolved. Likely related to positioning. 3. Leukocytosis: No evidence of infection. Likely related to bone marrow activity.  4. Anemia of chronic disease: Labs show evidence of acute hemolysis. Will continue monitoring and continue Hydrea for now. 5. Chronic Respiratory Failure with Hypoxia: Currently at baseline. Will encourage continued  incentive spirometer use. If still relatively hypoxic will consider transfusion of 1 unit RBC's.  6. 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  7. Chronic pain: Continue MS Contin   Code Status: Full Code Family Communication: N/A Disposition Plan: Not yet ready for discharge  [place].  Pager [phone]. If 7PM-7AM, please contact night-coverage.  05/12/2015, 3:58 PM  LOS: 4 days   Interim History: Pt reports pain as 7/10 and localized to ribs.   Consultants:  None  Procedures:  None  Antibiotics:  None    Objective: Filed Vitals:   05/12/15 0956 05/12/15 1222 05/12/15 1317 05/12/15 1544  BP: 128/87  125/83   Pulse: 73  77   Temp: 98 F (36.7 C)  98.6 F (37 C)   TempSrc: Oral  Oral   Resp: 12 14 14 12   Height:      Weight:      SpO2: 95% 94% 92% 93%   Weight change: -5 lb 2.1 oz (-2.328 kg)  Intake/Output Summary (Last 24 hours) at 05/12/15 1558 Last data filed at 05/12/15 0956  Gross per 24 hour   Intake    480 ml  Output    480 ml  Net      0 ml    General: Alert, awake, oriented x3, in mild distress secondary to pain.  HEENT: [place]/AT PEERL, EOMI, anicteric Neck: Trachea midline,  no masses, no thyromegal,y no JVD, no carotid bruit OROPHARYNX:  Moist, No exudate/ erythema/lesions.  Heart: Regular rate and rhythm, without murmurs, rubs, gallops, PMI non-displaced, no heaves or thrills on palpation.  Lungs: Clear to auscultation, no wheezing or rhonchi noted. No increased vocal fremitus resonant to percussion  Abdomen: Soft, nontender, nondistended, positive bowel sounds, no masses no hepatosplenomegaly noted.  Neuro: No focal neurological deficits noted cranial nerves II through XII grossly intact.  Strength at functional baseline in bilateral upper and lower extremities. Musculoskeletal: No warmth swelling or erythema around joints, no spinal tenderness noted. Psychiatric: Patient alert and oriented x3, good insight and cognition, good recent to remote recall.    Data Reviewed: Basic Metabolic Panel:  Recent Labs Lab 05/07/15 0940 05/09/15 0900 05/12/15 1230  NA 142 138 142  K 4.0 4.6 4.3  CL 111 108 114*  CO2 23 23 22   GLUCOSE 111* 124* 119*  BUN 8 18 15   CREATININE 0.67 0.85 0.76  CALCIUM 9.3 8.8* 9.2   Liver Function Tests:  Recent Labs Lab 05/07/15 0940 05/09/15 0900  AST 31 25  ALT 20 20  ALKPHOS 76 79  BILITOT 4.2* 3.2*  PROT 7.7 7.8  ALBUMIN 4.3 4.4   No results for  input(s): LIPASE, AMYLASE in the last 168 hours. No results for input(s): AMMONIA in the last 168 hours. CBC:  Recent Labs Lab 05/07/15 0940 05/08/15 0952 05/09/15 0900 05/10/15 1216 05/12/15 1230  WBC 17.7* 22.3* 22.1* 18.8* 16.0*  NEUTROABS 12.7* 16.5* 14.3* 12.9* 10.5*  HGB 6.8* 6.7* 6.6* 6.1* 6.3*  HCT 20.0* 19.7* 18.7* 17.8* 18.1*  MCV 87.7 89.1 87.8 88.6 86.6  PLT 572* 561* 511* 490* 474*   Cardiac Enzymes: No results for input(s): CKTOTAL, CKMB, CKMBINDEX, TROPONINI  in the last 168 hours. BNP (last 3 results)  Recent Labs  08/29/14 0810 09/10/14 0612 11/30/14 0910  BNP 1117.0* 549.5* 639.7*    ProBNP (last 3 results) No results for input(s): PROBNP in the last 8760 hours.  CBG: No results for input(s): GLUCAP in the last 168 hours.  Recent Results (from the past 240 hour(s))  MRSA PCR Screening     Status: None   Collection Time: 05/08/15  6:55 PM  Result Value Ref Range Status   MRSA by PCR NEGATIVE NEGATIVE Final    Comment:        The GeneXpert MRSA Assay (FDA approved for NASAL specimens only), is one component of a comprehensive MRSA colonization surveillance program. It is not intended to diagnose MRSA infection nor to guide or monitor treatment for MRSA infections.      Studies: Dg Chest 2 View  05/07/2015  CLINICAL DATA:  Sickle cell pain crisis, chest pain EXAM: CHEST  2 VIEW COMPARISON:  04/26/2015 FINDINGS: Left subclavian power port catheter tip SVC RA junction. Stable cardiomegaly with vascular congestion and basilar atelectasis/ parenchymal scarring. No superimposed pneumonia, collapse or consolidation. No edema, effusion or pneumothorax. Trachea midline. IMPRESSION: Stable cardiomegaly with vascular congestion and parenchymal scarring. No superimposed acute process. Electronically Signed   By: Jerilynn Mages.  Shick M.D.   On: 05/07/2015 09:05   Dg Chest 2 View  04/26/2015  CLINICAL DATA:  BILATERAL flank pain for 3 days. Sickle cell crisis. EXAM: CHEST  2 VIEW COMPARISON:  04/24/2015. FINDINGS: Cardiomegaly. Port-A-Cath unchanged, tip in RIGHT atrium. Mild vascular congestion. No infiltrates, or effusion. Sickle cell changes in the visualized osseous structures. IMPRESSION: Cardiomegaly.  No acute abnormality noted. Electronically Signed   By: Staci Righter M.D.   On: 04/26/2015 09:45   Dg Chest 2 View  04/24/2015  CLINICAL DATA:  Sickle cell crisis with chest pain, initial encounter EXAM: CHEST  2 VIEW COMPARISON:  03/19/2015  FINDINGS: Cardiac shadow is enlarged. A left chest wall port is again seen and stable. The lungs are well aerated bilaterally. No acute bony abnormality is seen. IMPRESSION: No acute abnormality noted. Electronically Signed   By: Inez Catalina M.D.   On: 04/24/2015 09:59    Scheduled Meds: . sodium chloride   Intravenous Once  . aspirin  81 mg Oral Daily  . cholecalciferol  2,000 Units Oral Daily  . enoxaparin  120 mg Subcutaneous Q24H  . folic acid  1 mg Oral q morning - 10a  . gabapentin  300 mg Oral TID  . HYDROmorphone   Intravenous 6 times per day  . hydroxyurea  1,000 mg Oral Daily  . ketorolac  30 mg Intravenous 4 times per day  . lisinopril  10 mg Oral Daily  . metoprolol succinate  50 mg Oral Daily  . morphine  30 mg Oral Q12H  . potassium chloride SA  20 mEq Oral q morning - 10a  . senna-docusate  1 tablet Oral BID  Continuous Infusions: . dextrose 5 % and 0.45% NaCl 10 mL/hr at 05/10/15 0912    Time spent 25 minurtes

## 2015-05-13 MED ORDER — HYDROMORPHONE HCL 2 MG/ML IJ SOLN
2.0000 mg | INTRAMUSCULAR | Status: DC
  Administered 2015-05-13 – 2015-05-14 (×13): 2 mg via INTRAVENOUS
  Filled 2015-05-13 (×13): qty 1

## 2015-05-13 NOTE — Progress Notes (Signed)
SICKLE CELL SERVICE PROGRESS NOTE  [name] [id number] DOB: 1980-08-22 DOA: 05/08/2015 PCP: [name], MD  Assessment/Plan: Active Problems:   Hb-SS disease with crisis ([place])   Sickle cell crisis ([place])  1. Hb SS with crisis: Pt rates pain at 7-8/10 and localized to ribs and now jaw. Patient also appears to have some emotional distress contributing to his current pain. However he reports that he would rather not discuss the cause of his emotional distress. Will continue clinician assisted doses for the next 24 hours. Continue current regimen- PCA, Toradol andNeurontin. 2. Right Infraorbital swelling:Resolved. Likely related to positioning. 3. Leukocytosis: No evidence of infection. Likely related to bone marrow activity.  4. Anemia of chronic disease: Labs show evidence of acute hemolysis. Will continue monitoring and continue Hydrea for now. 5. Chronic Respiratory Failure with Hypoxia: Currently at baseline. Will encourage continued  incentive spirometer use. If still relatively hypoxic will consider transfusion of 1 unit RBC's.  6. 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  7. Chronic pain: Continue MS Contin   Code Status: Full Code Family Communication: N/A Disposition Plan: Not yet ready for discharge  [place].  Pager [phone]. If 7PM-7AM, please contact night-coverage.  05/13/2015, 2:32 PM  LOS: 5 days   Interim History: Pt reports pain as 7/10 and localized to ribs and jaw.   Consultants:  None  Procedures:  None  Antibiotics:  None    Objective: Filed Vitals:   05/13/15 0600 05/13/15 0831 05/13/15 1109 05/13/15 1146  BP: 121/82  135/83   Pulse: 76  85   Temp: 98 F (36.7 C)  98.8 F (37.1 C)   TempSrc: Oral  Oral   Resp: 15 12 14 16   Height:      Weight: 171 lb 8 oz (77.792 kg)     SpO2: 91% 97% 91%    Weight change: -1 lb 8 oz (-0.68 kg)  Intake/Output Summary (Last 24  hours) at 05/13/15 1432 Last data filed at 05/13/15 1111  Gross per 24 hour  Intake    960 ml  Output   1095 ml  Net   -135 ml    General: Alert, awake, oriented x3, in mild distress secondary to pain.  HEENT: [place]/AT PEERL, EOMI, anicteric Neck: Trachea midline,  no masses, no thyromegal,y no JVD, no carotid bruit OROPHARYNX:  Moist, No exudate/ erythema/lesions.  Heart: Regular rate and rhythm, without murmurs, rubs, gallops, PMI non-displaced, no heaves or thrills on palpation.  Lungs: Clear to auscultation, no wheezing or rhonchi noted. No increased vocal fremitus resonant to percussion  Abdomen: Soft, nontender, nondistended, positive bowel sounds, no masses no hepatosplenomegaly noted.  Neuro: No focal neurological deficits noted cranial nerves II through XII grossly intact.  Strength at functional baseline in bilateral upper and lower extremities. Musculoskeletal: No warmth swelling or erythema around joints, no spinal tenderness noted. Psychiatric: Patient alert and oriented x3, good insight and cognition, good recent to remote recall.    Data Reviewed: Basic Metabolic Panel:  Recent Labs Lab 05/07/15 0940 05/09/15 0900 05/12/15 1230  NA 142 138 142  K 4.0 4.6 4.3  CL 111 108 114*  CO2 23 23 22   GLUCOSE 111* 124* 119*  BUN 8 18 15   CREATININE 0.67 0.85 0.76  CALCIUM 9.3 8.8* 9.2   Liver Function Tests:  Recent Labs Lab 05/07/15 0940 05/09/15 0900  AST 31 25  ALT 20 20  ALKPHOS 76 79  BILITOT 4.2* 3.2*  PROT  7.7 7.8  ALBUMIN 4.3 4.4   No results for input(s): LIPASE, AMYLASE in the last 168 hours. No results for input(s): AMMONIA in the last 168 hours. CBC:  Recent Labs Lab 05/07/15 0940 05/08/15 0952 05/09/15 0900 05/10/15 1216 05/12/15 1230  WBC 17.7* 22.3* 22.1* 18.8* 16.0*  NEUTROABS 12.7* 16.5* 14.3* 12.9* 10.5*  HGB 6.8* 6.7* 6.6* 6.1* 6.3*  HCT 20.0* 19.7* 18.7* 17.8* 18.1*  MCV 87.7 89.1 87.8 88.6 86.6  PLT 572* 561* 511* 490* 474*    Cardiac Enzymes: No results for input(s): CKTOTAL, CKMB, CKMBINDEX, TROPONINI in the last 168 hours. BNP (last 3 results)  Recent Labs  08/29/14 0810 09/10/14 0612 11/30/14 0910  BNP 1117.0* 549.5* 639.7*    ProBNP (last 3 results) No results for input(s): PROBNP in the last 8760 hours.  CBG: No results for input(s): GLUCAP in the last 168 hours.  Recent Results (from the past 240 hour(s))  MRSA PCR Screening     Status: None   Collection Time: 05/08/15  6:55 PM  Result Value Ref Range Status   MRSA by PCR NEGATIVE NEGATIVE Final    Comment:        The GeneXpert MRSA Assay (FDA approved for NASAL specimens only), is one component of a comprehensive MRSA colonization surveillance program. It is not intended to diagnose MRSA infection nor to guide or monitor treatment for MRSA infections.      Studies: Dg Chest 2 View  05/07/2015  CLINICAL DATA:  Sickle cell pain crisis, chest pain EXAM: CHEST  2 VIEW COMPARISON:  04/26/2015 FINDINGS: Left subclavian power port catheter tip SVC RA junction. Stable cardiomegaly with vascular congestion and basilar atelectasis/ parenchymal scarring. No superimposed pneumonia, collapse or consolidation. No edema, effusion or pneumothorax. Trachea midline. IMPRESSION: Stable cardiomegaly with vascular congestion and parenchymal scarring. No superimposed acute process. Electronically Signed   By: Jerilynn Mages.  Shick M.D.   On: 05/07/2015 09:05   Dg Chest 2 View  04/26/2015  CLINICAL DATA:  BILATERAL flank pain for 3 days. Sickle cell crisis. EXAM: CHEST  2 VIEW COMPARISON:  04/24/2015. FINDINGS: Cardiomegaly. Port-A-Cath unchanged, tip in RIGHT atrium. Mild vascular congestion. No infiltrates, or effusion. Sickle cell changes in the visualized osseous structures. IMPRESSION: Cardiomegaly.  No acute abnormality noted. Electronically Signed   By: Staci Righter M.D.   On: 04/26/2015 09:45   Dg Chest 2 View  04/24/2015  CLINICAL DATA:  Sickle cell crisis  with chest pain, initial encounter EXAM: CHEST  2 VIEW COMPARISON:  03/19/2015 FINDINGS: Cardiac shadow is enlarged. A left chest wall port is again seen and stable. The lungs are well aerated bilaterally. No acute bony abnormality is seen. IMPRESSION: No acute abnormality noted. Electronically Signed   By: Inez Catalina M.D.   On: 04/24/2015 09:59    Scheduled Meds: . sodium chloride   Intravenous Once  . aspirin  81 mg Oral Daily  . cholecalciferol  2,000 Units Oral Daily  . enoxaparin  120 mg Subcutaneous Q24H  . folic acid  1 mg Oral q morning - 10a  . gabapentin  300 mg Oral TID  . HYDROmorphone   Intravenous 6 times per day  .  HYDROmorphone (DILAUDID) injection  2 mg Intravenous Q2H  . hydroxyurea  1,000 mg Oral Daily  . lisinopril  10 mg Oral Daily  . metoprolol succinate  50 mg Oral Daily  . morphine  30 mg Oral Q12H  . potassium chloride SA  20 mEq Oral q morning -  10a  . senna-docusate  1 tablet Oral BID   Continuous Infusions: . dextrose 5 % and 0.45% NaCl 10 mL/hr at 05/10/15 0912    Time spent 25 minurtes

## 2015-05-14 LAB — RETICULOCYTES
RBC.: 2.05 MIL/uL — AB (ref 4.22–5.81)
RETIC COUNT ABSOLUTE: 410 10*3/uL — AB (ref 19.0–186.0)
Retic Ct Pct: 20 % — ABNORMAL HIGH (ref 0.4–3.1)

## 2015-05-14 LAB — CBC WITH DIFFERENTIAL/PLATELET
BASOS ABS: 0.2 10*3/uL — AB (ref 0.0–0.1)
BASOS PCT: 1 %
EOS PCT: 3 %
Eosinophils Absolute: 0.6 10*3/uL (ref 0.0–0.7)
HCT: 18.4 % — ABNORMAL LOW (ref 39.0–52.0)
Hemoglobin: 6.3 g/dL — CL (ref 13.0–17.0)
LYMPHS PCT: 20 %
Lymphs Abs: 3.9 10*3/uL (ref 0.7–4.0)
MCH: 30.7 pg (ref 26.0–34.0)
MCHC: 34.2 g/dL (ref 30.0–36.0)
MCV: 89.8 fL (ref 78.0–100.0)
Monocytes Absolute: 2 10*3/uL — ABNORMAL HIGH (ref 0.1–1.0)
Monocytes Relative: 10 %
NEUTROS PCT: 66 %
NRBC: 3 /100{WBCs} — AB
Neutro Abs: 13 10*3/uL — ABNORMAL HIGH (ref 1.7–7.7)
PLATELETS: 481 10*3/uL — AB (ref 150–400)
RBC: 2.05 MIL/uL — ABNORMAL LOW (ref 4.22–5.81)
RDW: 22.2 % — AB (ref 11.5–15.5)
WBC: 19.7 10*3/uL — ABNORMAL HIGH (ref 4.0–10.5)

## 2015-05-14 LAB — TYPE AND SCREEN
ABO/RH(D): O POS
Antibody Screen: NEGATIVE
Unit division: 0
Unit division: 0

## 2015-05-14 IMAGING — CR DG CHEST 2V
2 series · 2 of 2 positions shown · non-contrast
Comparison: 05/29/2012.

CLINICAL DATA: Shortness of breath.

CHEST - 2 VIEW

[w chest pa]
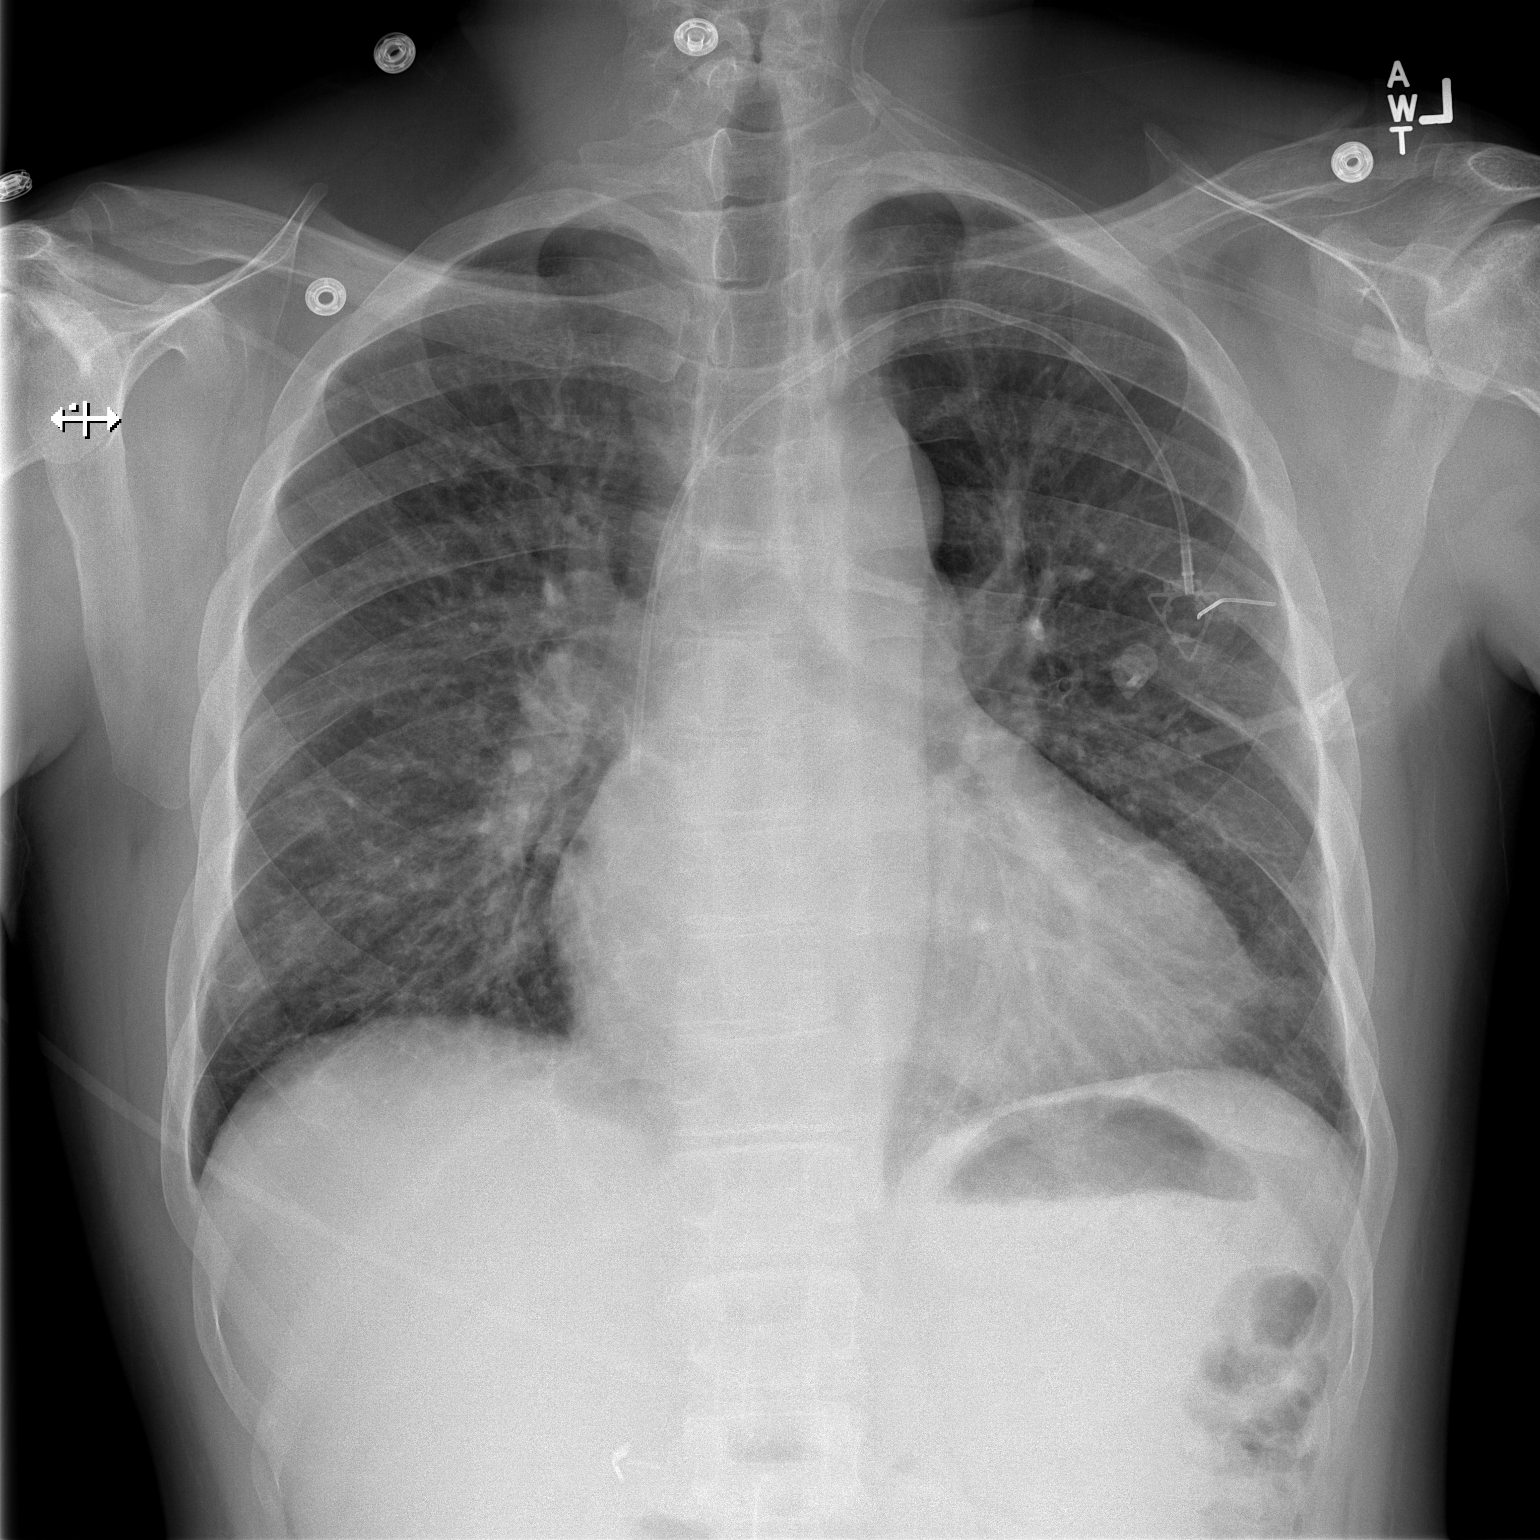

[w chest lat]
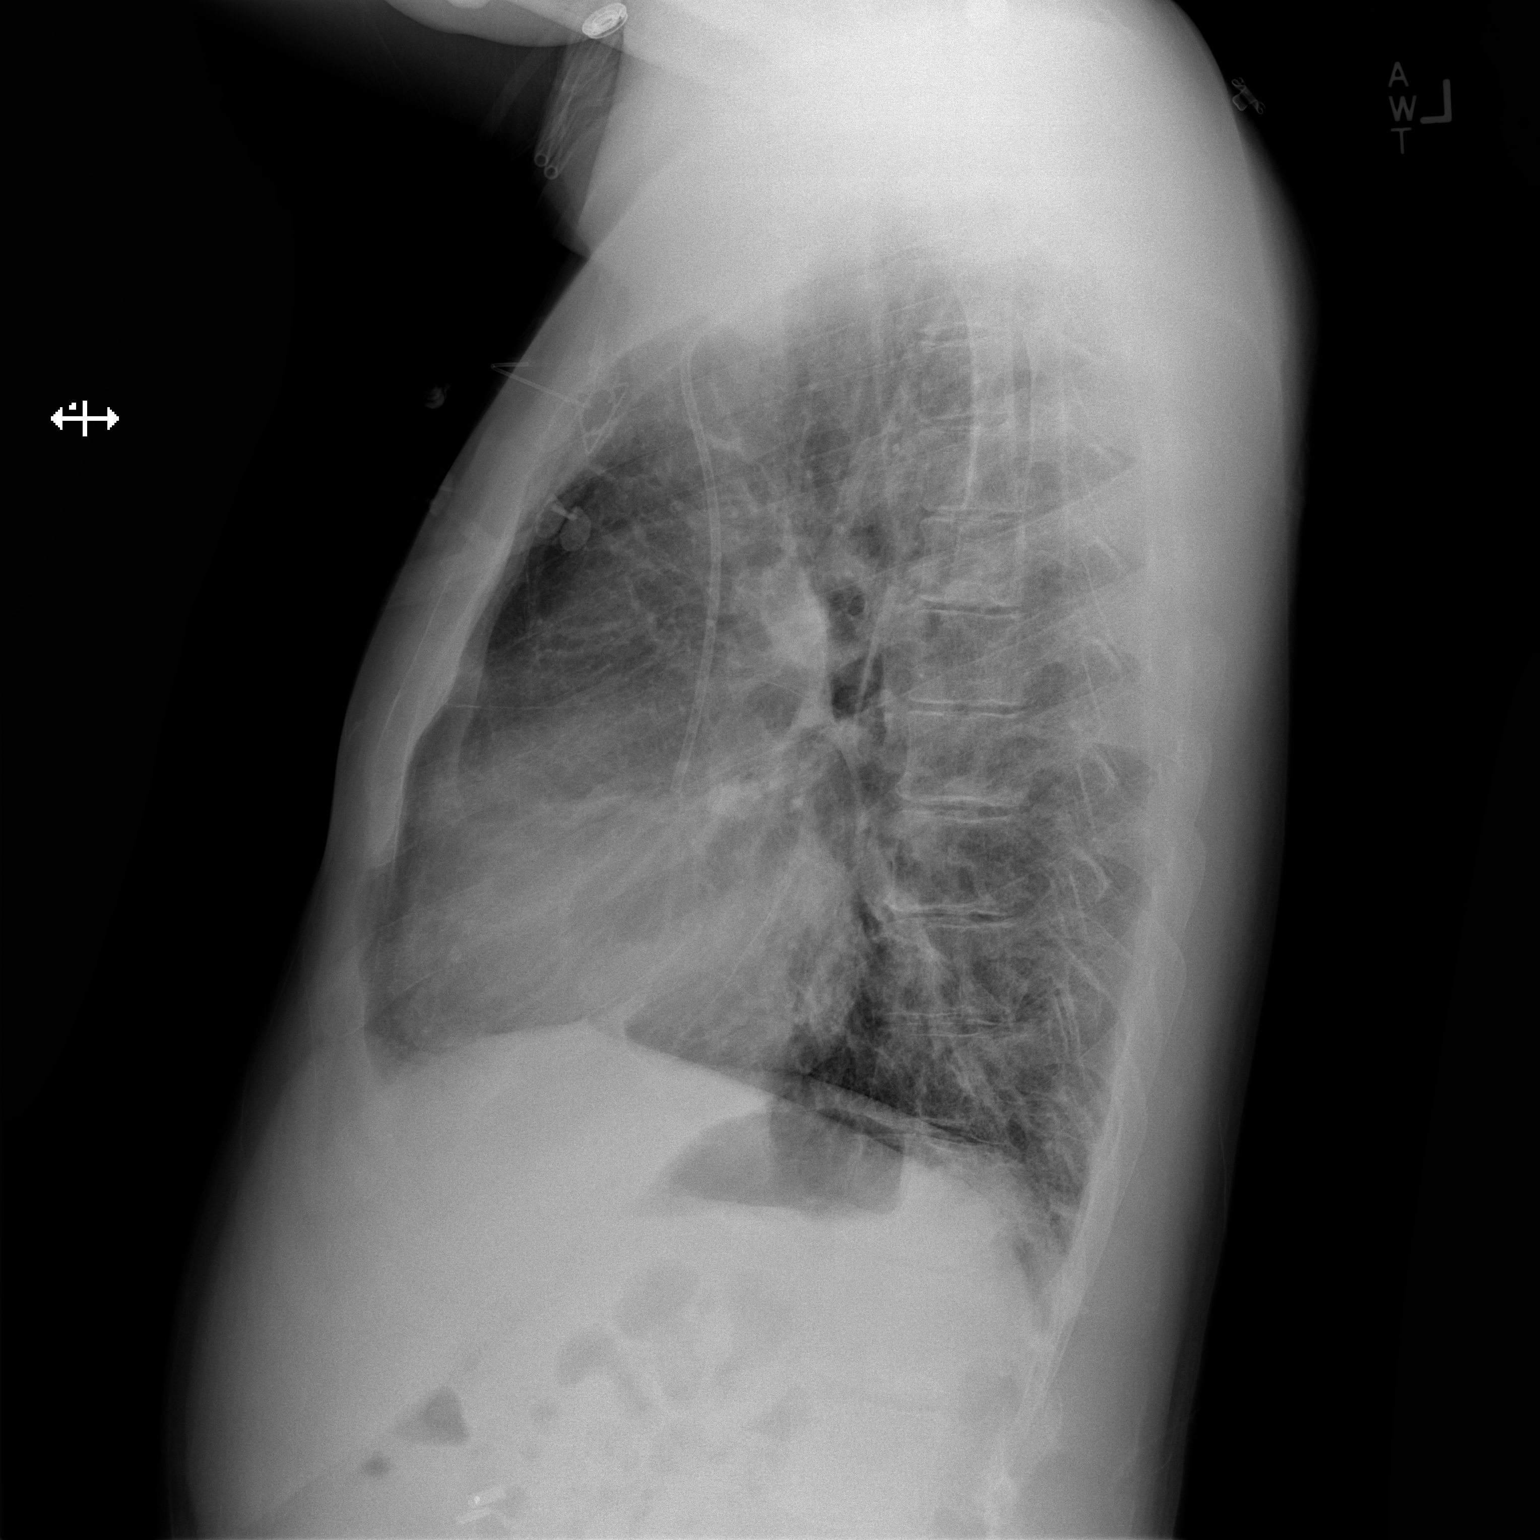

[2 of 2 positions shown; findings below may reference images not displayed]

FINDINGS: The power port is stable.  The heart is borderline
enlarged but unchanged.  The mediastinal and hilar contours are
stable.  There are chronic bronchitic type interstitial lung
changes but no infiltrates, edema or effusions.  The bony thorax is
intact.
IMPRESSION: Chronic bronchitic type interstitial lung changes but no acute
overlying pulmonary process.

## 2015-05-14 MED ORDER — HYDROMORPHONE HCL 4 MG PO TABS
4.0000 mg | ORAL_TABLET | ORAL | Status: DC
  Administered 2015-05-14 – 2015-05-15 (×6): 4 mg via ORAL
  Filled 2015-05-14 (×7): qty 1

## 2015-05-14 MED ORDER — HYDROMORPHONE HCL 1 MG/ML IJ SOLN
1.0000 mg | INTRAMUSCULAR | Status: AC
  Administered 2015-05-14 – 2015-05-15 (×8): 1 mg via INTRAVENOUS
  Filled 2015-05-14 (×9): qty 1

## 2015-05-14 NOTE — Consult Note (Signed)
   [facility] CM Inpatient Consult   05/14/2015  JULLIUS NASTASE 11-20-1980 [id number]   Patient recently signed up with [facility] Management services. Please see chart review tab then notes for [place] patient outreach encounters with patient. Spoke with Mr. Beauchemin at bedside. He is agreeable to ongoing follow up. He is currently hospitalized for sickle cell crisis. [facility] Care Management program following for frequency of admissions. Will make inpatient RNCM aware. Will also touch base with [place].   Marthenia Rolling, MSN-Ed, RN,BSN [facility] Liaison [phone]

## 2015-05-14 NOTE — Progress Notes (Signed)
SICKLE CELL SERVICE PROGRESS NOTE  KIRIN BUELTEL [id number] DOB: [birth year] DOA: 05/08/2015 PCP: [name], MD  Assessment/Plan: Active Problems:   Hb-SS disease with crisis ([place])   Sickle cell crisis ([place])  1. Hb SS with crisis: Pt rates pain at 6-7/10 and localized to ribs. Pain in jaw resolved. Will decrease clinician assisted doses to 1 mg q 2 hours and schedule oral Dilaudid. Continue current regimen- PCA, Toradol and Neurontin. 2. Leukocytosis: No evidence of infection. Likely related to bone marrow activity.  3. Anemia of chronic disease: Labs show evidence of acute hemolysis. Hb holding at 6.3. Will continue monitoring and continue Hydrea for now. 4. Chronic Respiratory Failure with Hypoxia: Currently at baseline. Will encourage continued  incentive spirometer use.  5. 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  6. Chronic pain: Continue MS Contin   Code Status: Full Code Family Communication: N/A Disposition Plan: Not yet ready for discharge  [place].  Pager [phone]. If 7PM-7AM, please contact night-coverage.  05/14/2015, 12:16 PM  LOS: 6 days   Interim History: Pt reports pain as 6-7/10 and localized to ribs. Pain in jaw resolved. .   Consultants:  None  Procedures:  None  Antibiotics:  None    Objective: Filed Vitals:   05/14/15 0531 05/14/15 0741 05/14/15 1001 05/14/15 1200  BP: 115/72  123/83   Pulse: 74  75   Temp: 98.6 F (37 C)  98.6 F (37 C)   TempSrc: Oral  Oral   Resp: 14 12 15 13   Height:      Weight: 171 lb 3.2 oz (77.656 kg)     SpO2:  93% 94% 92%   Weight change: -4.8 oz (-0.136 kg)  Intake/Output Summary (Last 24 hours) at 05/14/15 1216 Last data filed at 05/14/15 1011  Gross per 24 hour  Intake    720 ml  Output   2350 ml  Net  -1630 ml    General: Alert, awake, oriented x3, in mild distress secondary to pain. Better appearing than yesterday. HEENT:  /AT PEERL, EOMI, anicteric Neck: Trachea midline,  no masses, no thyromegal,y no JVD, no carotid bruit OROPHARYNX:  Moist, No exudate/ erythema/lesions.  Heart: Regular rate and rhythm, without murmurs, rubs, gallops, PMI non-displaced, no heaves or thrills on palpation.  Lungs: Clear to auscultation, no wheezing or rhonchi noted. No increased vocal fremitus resonant to percussion  Abdomen: Soft, nontender, nondistended, positive bowel sounds, no masses no hepatosplenomegaly noted.  Neuro: No focal neurological deficits noted cranial nerves II through XII grossly intact.  Strength at functional baseline in bilateral upper and lower extremities. Musculoskeletal: No warmth swelling or erythema around joints, no spinal tenderness noted. Psychiatric: Patient alert and oriented x3, good insight and cognition, good recent to remote recall.    Data Reviewed: Basic Metabolic Panel:  Recent Labs Lab 05/09/15 0900 05/12/15 1230  NA 138 142  K 4.6 4.3  CL 108 114*  CO2 23 22  GLUCOSE 124* 119*  BUN 18 15  CREATININE 0.85 0.76  CALCIUM 8.8* 9.2   Liver Function Tests:  Recent Labs Lab 05/09/15 0900  AST 25  ALT 20  ALKPHOS 79  BILITOT 3.2*  PROT 7.8  ALBUMIN 4.4   No results for input(s): LIPASE, AMYLASE in the last 168 hours. No results for input(s): AMMONIA in the last 168 hours. CBC:  Recent Labs Lab 05/08/15 0952 05/09/15 0900 05/10/15 1216 05/12/15 1230 05/14/15 0601  WBC 22.3* 22.1* 18.8* 16.0* 19.7*  NEUTROABS 16.5* 14.3* 12.9* 10.5* 13.0*  HGB 6.7* 6.6* 6.1* 6.3* 6.3*  HCT 19.7* 18.7* 17.8* 18.1* 18.4*  MCV 89.1 87.8 88.6 86.6 89.8  PLT 561* 511* 490* 474* 481*   Cardiac Enzymes: No results for input(s): CKTOTAL, CKMB, CKMBINDEX, TROPONINI in the last 168 hours. BNP (last 3 results)  Recent Labs  08/29/14 0810 09/10/14 0612 11/30/14 0910  BNP 1117.0* 549.5* 639.7*    ProBNP (last 3 results) No results for input(s): PROBNP in the last 8760  hours.  CBG: No results for input(s): GLUCAP in the last 168 hours.  Recent Results (from the past 240 hour(s))  MRSA PCR Screening     Status: None   Collection Time: 05/08/15  6:55 PM  Result Value Ref Range Status   MRSA by PCR NEGATIVE NEGATIVE Final    Comment:        The GeneXpert MRSA Assay (FDA approved for NASAL specimens only), is one component of a comprehensive MRSA colonization surveillance program. It is not intended to diagnose MRSA infection nor to guide or monitor treatment for MRSA infections.      Studies: Dg Chest 2 View  05/07/2015  CLINICAL DATA:  Sickle cell pain crisis, chest pain EXAM: CHEST  2 VIEW COMPARISON:  04/26/2015 FINDINGS: Left subclavian power port catheter tip SVC RA junction. Stable cardiomegaly with vascular congestion and basilar atelectasis/ parenchymal scarring. No superimposed pneumonia, collapse or consolidation. No edema, effusion or pneumothorax. Trachea midline. IMPRESSION: Stable cardiomegaly with vascular congestion and parenchymal scarring. No superimposed acute process. Electronically Signed   By: Jerilynn Mages.  Shick M.D.   On: 05/07/2015 09:05   Dg Chest 2 View  04/26/2015  CLINICAL DATA:  BILATERAL flank pain for 3 days. Sickle cell crisis. EXAM: CHEST  2 VIEW COMPARISON:  04/24/2015. FINDINGS: Cardiomegaly. Port-A-Cath unchanged, tip in RIGHT atrium. Mild vascular congestion. No infiltrates, or effusion. Sickle cell changes in the visualized osseous structures. IMPRESSION: Cardiomegaly.  No acute abnormality noted. Electronically Signed   By: Staci Righter M.D.   On: 04/26/2015 09:45   Dg Chest 2 View  04/24/2015  CLINICAL DATA:  Sickle cell crisis with chest pain, initial encounter EXAM: CHEST  2 VIEW COMPARISON:  03/19/2015 FINDINGS: Cardiac shadow is enlarged. A left chest wall port is again seen and stable. The lungs are well aerated bilaterally. No acute bony abnormality is seen. IMPRESSION: No acute abnormality noted. Electronically  Signed   By: Inez Catalina M.D.   On: 04/24/2015 09:59    Scheduled Meds: . sodium chloride   Intravenous Once  . aspirin  81 mg Oral Daily  . cholecalciferol  2,000 Units Oral Daily  . enoxaparin  120 mg Subcutaneous Q24H  . folic acid  1 mg Oral q morning - 10a  . gabapentin  300 mg Oral TID  . HYDROmorphone   Intravenous 6 times per day  .  HYDROmorphone (DILAUDID) injection  2 mg Intravenous Q2H  . hydroxyurea  1,000 mg Oral Daily  . lisinopril  10 mg Oral Daily  . metoprolol succinate  50 mg Oral Daily  . morphine  30 mg Oral Q12H  . potassium chloride SA  20 mEq Oral q morning - 10a  . senna-docusate  1 tablet Oral BID   Continuous Infusions: . dextrose 5 % and 0.45% NaCl 10 mL/hr at 05/13/15 1704    Time spent 25 minurtes

## 2015-05-15 MED ORDER — HYDROMORPHONE HCL 1 MG/ML IJ SOLN: 0.5000 mg | Freq: Once | INTRAMUSCULAR | Status: DC

## 2015-05-15 NOTE — Discharge Summary (Signed)
Physician Discharge Summary  [name] [id number] DOB: [birth year] DOA: 05/08/2015  PCP: [name], MD  Admit date: 05/08/2015 Discharge date: 05/15/2015  Discharge Diagnoses:  Active Problems:   Hb-SS disease with crisis ([place])   Sickle cell crisis [facility])   Discharge Condition: Stable  Disposition: Discharge home  Diet: Heart healthy Wt Readings from Last 3 Encounters:  05/15/15 77.747 kg (171 lb 6.4 oz)  05/07/15 78.019 kg (172 lb)  05/07/15 78.019 kg (172 lb)    History of present illness:  Opiate tolerant patient with Hb SS presents with c/o pain in ribs and ankles for several days. He states that he was doing well and had a sudden increase in his pain which by yesterday he was unable to manage with his oral analgesics. He was seen in the [place] and treated yesterday and reports that his pain was still uncontrolled and at 7-8/10. He describes the pain as throbbing and intermittently sharp and localized to the ribs and ankles. He denies adn f/c, cough, increased SOB, N/V/D. In the ED he received 3 doses of Dilaudid and pain is still present. He reports that on arrival to the ED his pain was at 10/10 and after last dose of medication his pain is now 8-9/10. He has a CBC performed but reticulocyte and LDH are still pending. BMET was w/in normal limits.  Hospital Course:  Patient was admitted with sickle cell painful crisis. Pain was at 8/10 on admission. Treated with IV dilaudid individualized PCA, clinician assisted doses, IV toradol and IVF. At time of Discharge, pain was down to 4/10  Which is his baseline and he was functional and hemodynamically stable. Patient was discharged home to follow up with PCP and resume home meds.  Discharge Exam: Filed Vitals:   05/15/15 0958 05/15/15 1154  BP: 115/75   Pulse: 64   Temp: 98.6 F (37 C)   Resp: 12 12   Filed Vitals:   05/15/15 0743 05/15/15 0956 05/15/15 0958 05/15/15 1154  BP:  115/75  115/75   Pulse:   64   Temp:   98.6 F (37 C)   TempSrc:   Oral   Resp: 11  12 12   Height:      Weight:      SpO2: 95%  98% 96%   General: Alert, awake, oriented x 3, in no distress. HEENT: [place]/AT PEERL, EOMI, anicteric Neck: Trachea midline, no masses, no thyromegal,y no JVD, no carotid bruit OROPHARYNX: Moist, No exudate/ erythema/lesions.  Heart: Regular rate and rhythm, without murmurs, rubs, gallops, PMI non-displaced, no heaves or thrills on palpation.  Lungs: Clear to auscultation, no wheezing or rhonchi noted. No increased vocal fremitus resonant to percussion  Abdomen: Soft, nontender, nondistended, positive bowel sounds, no masses no hepatosplenomegaly noted.  Neuro: No focal neurological deficits noted cranial nerves II through XII grossly intact. Strength at functional baseline in bilateral upper and lower extremities. Musculoskeletal: No warmth swelling or erythema around joints, no spinal tenderness noted. Psychiatric: Patient alert and oriented x3, good insight and cognition, good recent to remote recall.  Discharge Instructions Follow-up with primary care physician at the sickle cell clinic as scheduled Use home oxygen as recommended Take the medications Liberal oral liquid intake    Medication List    TAKE these medications        aspirin 81 MG chewable tablet  Chew 1 tablet (81 mg total) by mouth daily.     cyclobenzaprine 5 MG tablet  Commonly known  as:  FLEXERIL  Take 1 tablet (5 mg total) by mouth 3 (three) times daily as needed for muscle spasms.     enoxaparin 120 MG/0.8ML injection  Commonly known as:  LOVENOX  Inject 0.8 mLs (120 mg total) into the skin daily.     folic acid 1 MG tablet  Commonly known as:  FOLVITE  Take 1 tablet (1 mg total) by mouth every morning.     gabapentin 300 MG capsule  Commonly known as:  NEURONTIN  Take 1 capsule (300 mg total) by mouth 3 (three) times daily.     HYDROmorphone 4 MG tablet  Commonly known  as:  DILAUDID  Take 1 tablet (4 mg total) by mouth every 4 (four) hours as needed for severe pain.     hydroxyurea 500 MG capsule  Commonly known as:  HYDREA  Take 1 capsule (500 mg total) by mouth daily. May take with food to minimize GI side effects.     lisinopril 10 MG tablet  Commonly known as:  PRINIVIL,ZESTRIL  Take 1 tablet (10 mg total) by mouth daily.     metoprolol succinate 50 MG 24 hr tablet  Commonly known as:  TOPROL-XL  Take 1 tablet (50 mg total) by mouth daily.     morphine 30 MG 12 hr tablet  Commonly known as:  MS CONTIN  Take 1 tablet (30 mg total) by mouth every 12 (twelve) hours.     potassium chloride SA 20 MEQ tablet  Commonly known as:  K-DUR,KLOR-CON  Take 1 tablet (20 mEq total) by mouth every morning.     Vitamin D 2000 units tablet  Take 1 tablet (2,000 Units total) by mouth daily.     zolpidem 10 MG tablet  Commonly known as:  AMBIEN  Take 1 tablet (10 mg total) by mouth at bedtime as needed for sleep.          The results of significant diagnostics from this hospitalization (including imaging, microbiology, ancillary and laboratory) are listed below for reference.    Significant Diagnostic Studies: Dg Chest 2 View  05/07/2015  CLINICAL DATA:  Sickle cell pain crisis, chest pain EXAM: CHEST  2 VIEW COMPARISON:  04/26/2015 FINDINGS: Left subclavian power port catheter tip SVC RA junction. Stable cardiomegaly with vascular congestion and basilar atelectasis/ parenchymal scarring. No superimposed pneumonia, collapse or consolidation. No edema, effusion or pneumothorax. Trachea midline. IMPRESSION: Stable cardiomegaly with vascular congestion and parenchymal scarring. No superimposed acute process. Electronically Signed   By: Jerilynn Mages.  Shick M.D.   On: 05/07/2015 09:05   Dg Chest 2 View  04/26/2015  CLINICAL DATA:  BILATERAL flank pain for 3 days. Sickle cell crisis. EXAM: CHEST  2 VIEW COMPARISON:  04/24/2015. FINDINGS: Cardiomegaly. Port-A-Cath  unchanged, tip in RIGHT atrium. Mild vascular congestion. No infiltrates, or effusion. Sickle cell changes in the visualized osseous structures. IMPRESSION: Cardiomegaly.  No acute abnormality noted. Electronically Signed   By: Staci Righter M.D.   On: 04/26/2015 09:45   Dg Chest 2 View  04/24/2015  CLINICAL DATA:  Sickle cell crisis with chest pain, initial encounter EXAM: CHEST  2 VIEW COMPARISON:  03/19/2015 FINDINGS: Cardiac shadow is enlarged. A left chest wall port is again seen and stable. The lungs are well aerated bilaterally. No acute bony abnormality is seen. IMPRESSION: No acute abnormality noted. Electronically Signed   By: Inez Catalina M.D.   On: 04/24/2015 09:59    Microbiology: Recent Results (from the past 240 hour(s))  MRSA  PCR Screening     Status: None   Collection Time: 05/08/15  6:55 PM  Result Value Ref Range Status   MRSA by PCR NEGATIVE NEGATIVE Final    Comment:        The GeneXpert MRSA Assay (FDA approved for NASAL specimens only), is one component of a comprehensive MRSA colonization surveillance program. It is not intended to diagnose MRSA infection nor to guide or monitor treatment for MRSA infections.      Labs: Basic Metabolic Panel:  Recent Labs Lab 05/09/15 0900 05/12/15 1230  NA 138 142  K 4.6 4.3  CL 108 114*  CO2 23 22  GLUCOSE 124* 119*  BUN 18 15  CREATININE 0.85 0.76  CALCIUM 8.8* 9.2   Liver Function Tests:  Recent Labs Lab 05/09/15 0900  AST 25  ALT 20  ALKPHOS 79  BILITOT 3.2*  PROT 7.8  ALBUMIN 4.4   No results for input(s): LIPASE, AMYLASE in the last 168 hours. No results for input(s): AMMONIA in the last 168 hours. CBC:  Recent Labs Lab 05/09/15 0900 05/10/15 1216 05/12/15 1230 05/14/15 0601  WBC 22.1* 18.8* 16.0* 19.7*  NEUTROABS 14.3* 12.9* 10.5* 13.0*  HGB 6.6* 6.1* 6.3* 6.3*  HCT 18.7* 17.8* 18.1* 18.4*  MCV 87.8 88.6 86.6 89.8  PLT 511* 490* 474* 481*   Cardiac Enzymes: No results for input(s):  CKTOTAL, CKMB, CKMBINDEX, TROPONINI in the last 168 hours. BNP: Invalid input(s): POCBNP CBG: No results for input(s): GLUCAP in the last 168 hours.  Time coordinating discharge: 35 minutes  Signed:  [name], MD  [facility] 05/15/2015, 11:58 AM

## 2015-05-15 NOTE — Progress Notes (Signed)
Patient discharged home in stable condition. Discharge instructions given. Pt verbalized understanding.

## 2015-05-15 NOTE — Care Management Important Message (Signed)
Important Message  Patient Details  Name: [name] MRN: [id number] Date of Birth: 1980/07/18   Medicare Important Message Given:  Yes    [name] 05/15/2015, 9:09 AMImportant Message  Patient Details  Name: [name] MRN: [id number] Date of Birth: 12-08-1980   Medicare Important Message Given:  Yes    [name] 05/15/2015, 9:09 AM

## 2015-05-18 ENCOUNTER — Other Ambulatory Visit: Payer: Self-pay | Admitting: *Deleted

## 2015-05-18 NOTE — Patient Outreach (Signed)
[facility]) Care Management  05/18/2015  [name] Jun 05, 1980 [id number]  Unsuccessful telephone outreach attempt to [name] is a 35 y/o male, who had been followed by [facility] for transition of care after recent IP hospital visit March 24-30, 2017, for Acute Renal Failure secondary to dehydration with sickle cell crisis, during which time patient was noted to have HTN and pulmonary HTN as well.  Unfortunately, [name] was subsequently re-admitted to the hospital April 7-15, 2017 in sickle cell crisis.  HIPPA compliant VM msg left for patient along with my contact details, asking him to return my call. Will re-attempt call to patient tomorrow if I do not hear back from him before then.  Plan: [place] telephone outreach tomorrow for transition of care   Oneta Rack, RN, BSN, [employer] Coordinator [facility] Care Management  [phone]

## 2015-05-19 ENCOUNTER — Ambulatory Visit (HOSPITAL_COMMUNITY)
Admission: RE | Admit: 2015-05-19 | Discharge: 2015-05-19 | Disposition: A | Discharge status: Home / Self Care | Payer: Medicare Other | Source: Ambulatory Visit | Attending: Internal Medicine | Admitting: Internal Medicine

## 2015-05-19 ENCOUNTER — Institutional Professional Consult (permissible substitution): Payer: Self-pay | Admitting: Internal Medicine

## 2015-05-19 ENCOUNTER — Encounter: Payer: Self-pay | Admitting: Internal Medicine

## 2015-05-19 ENCOUNTER — Other Ambulatory Visit: Payer: Self-pay | Admitting: Internal Medicine

## 2015-05-19 ENCOUNTER — Other Ambulatory Visit: Payer: Self-pay | Admitting: *Deleted

## 2015-05-19 ENCOUNTER — Ambulatory Visit (INDEPENDENT_AMBULATORY_CARE_PROVIDER_SITE_OTHER): Payer: Medicare Other | Admitting: Internal Medicine

## 2015-05-19 ENCOUNTER — Encounter (HOSPITAL_COMMUNITY): Payer: Self-pay

## 2015-05-19 VITALS — BP 108/66 | HR 84 | Temp 98.8°F | Resp 18 | Ht 72.0 in | Wt 177.0 lb

## 2015-05-19 VITALS — BP 105/67 | HR 67 | Temp 98.9°F | Resp 16 | Wt 177.0 lb

## 2015-05-19 DIAGNOSIS — D57819 Other sickle-cell disorders with crisis, unspecified: Secondary | ICD-10-CM | POA: Diagnosis not present

## 2015-05-19 DIAGNOSIS — D5700 Hb-SS disease with crisis, unspecified: Secondary | ICD-10-CM

## 2015-05-19 DIAGNOSIS — I10 Essential (primary) hypertension: Secondary | ICD-10-CM

## 2015-05-19 MED ORDER — SODIUM CHLORIDE 0.9 % IV SOLN
Freq: Once | INTRAVENOUS | Status: AC
  Administered 2015-05-19: 12:00:00 via INTRAVENOUS

## 2015-05-19 MED ORDER — HYDROMORPHONE HCL 2 MG/ML IJ SOLN
1.0000 mg | Freq: Once | INTRAMUSCULAR | Status: AC
Start: 2015-05-19 — End: 2015-05-19
  Administered 2015-05-19: 1 mg via INTRAVENOUS
  Filled 2015-05-19: qty 1

## 2015-05-19 MED ORDER — SODIUM CHLORIDE 0.9% FLUSH
10.0000 mL | INTRAVENOUS | Status: AC | PRN
  Administered 2015-05-19: 10 mL

## 2015-05-19 MED ORDER — HEPARIN SOD (PORK) LOCK FLUSH 100 UNIT/ML IV SOLN
500.0000 [IU] | INTRAVENOUS | Status: AC | PRN
  Administered 2015-05-19: 500 [IU]
  Filled 2015-05-19: qty 5

## 2015-05-19 MED ORDER — KETOROLAC TROMETHAMINE 30 MG/ML IJ SOLN
30.0000 mg | Freq: Once | INTRAMUSCULAR | Status: AC
  Administered 2015-05-19: 30 mg via INTRAVENOUS
  Filled 2015-05-19: qty 1

## 2015-05-19 NOTE — Patient Instructions (Signed)
Chronic Pain °Chronic pain can be defined as pain that is off and on and lasts for 3-6 months or longer. Many things cause chronic pain, which can make it difficult to make a diagnosis. There are many treatment options available for chronic pain. However, finding a treatment that works well for you may require trying various approaches until the right one is found. Many people benefit from a combination of two or more types of treatment to control their pain. °SYMPTOMS  °Chronic pain can occur anywhere in the body and can range from mild to very severe. Some types of chronic pain include: °· Headache. °· Low back pain. °· Cancer pain. °· Arthritis pain. °· Neurogenic pain. This is pain resulting from damage to nerves. ° People with chronic pain may also have other symptoms such as: °· Depression. °· Anger. °· Insomnia. °· Anxiety. °DIAGNOSIS  °Your health care provider will help diagnose your condition over time. In many cases, the initial focus will be on excluding possible conditions that could be causing the pain. Depending on your symptoms, your health care provider may order tests to diagnose your condition. Some of these tests may include:  °· Blood tests.   °· CT scan.   °· MRI.   °· X-rays.   °· Ultrasounds.   °· Nerve conduction studies.   °You may need to see a specialist.  °TREATMENT  °Finding treatment that works well may take time. You may be referred to a pain specialist. He or she may prescribe medicine or therapies, such as:  °· Mindful meditation or yoga. °· Shots (injections) of numbing or pain-relieving medicines into the spine or area of pain. °· Local electrical stimulation. °· Acupuncture.   °· Massage therapy.   °· Aroma, color, light, or sound therapy.   °· Biofeedback.   °· Working with a physical therapist to keep from getting stiff.   °· Regular, gentle exercise.   °· Cognitive or behavioral therapy.   °· Group support.   °Sometimes, surgery may be recommended.  °HOME CARE INSTRUCTIONS   °· Take all medicines as directed by your health care provider.   °· Lessen stress in your life by relaxing and doing things such as listening to calming music.   °· Exercise or be active as directed by your health care provider.   °· Eat a healthy diet and include things such as vegetables, fruits, fish, and lean meats in your diet.   °· Keep all follow-up appointments with your health care provider.   °· Attend a support group with others suffering from chronic pain. °SEEK MEDICAL CARE IF:  °· Your pain gets worse.   °· You develop a new pain that was not there before.   °· You cannot tolerate medicines given to you by your health care provider.   °· You have new symptoms since your last visit with your health care provider.   °SEEK IMMEDIATE MEDICAL CARE IF:  °· You feel weak.   °· You have decreased sensation or numbness.   °· You lose control of bowel or bladder function.   °· Your pain suddenly gets much worse.   °· You develop shaking. °· You develop chills. °· You develop confusion. °· You develop chest pain. °· You develop shortness of breath.   °MAKE SURE YOU: °· Understand these instructions. °· Will watch your condition. °· Will get help right away if you are not doing well or get worse. °  °This information is not intended to replace advice given to you by your health care provider. Make sure you discuss any questions you have with your health care provider. °  °Document Released: 10/09/2001   Document Revised: 09/19/2012 Document Reviewed: 07/13/2012 °Elsevier Interactive Patient Education ©2016 Elsevier Inc[employer] °Sickle Cell Anemia, Adult °Sickle cell anemia is a condition in which red blood cells have an abnormal "sickle" shape. This abnormal shape shortens the cells' life span, which results in a lower than normal concentration of red blood cells in the blood. The sickle shape also causes the cells to clump together and block free blood flow through the blood vessels. As a result, the tissues and organs  of the body do not receive enough oxygen. Sickle cell anemia causes organ damage and pain and increases the risk of infection. °CAUSES  °Sickle cell anemia is a genetic disorder. Those who receive two copies of the gene have the condition, and those who receive one copy have the trait. °RISK FACTORS °The sickle cell gene is most common in people whose families originated in [place]. Other areas of the globe where sickle cell trait occurs include the Mediterranean, [place] and [place], the [place], and the [place].  °SIGNS AND SYMPTOMS °· Pain, especially in the extremities, back, chest, or abdomen (common). The pain may start suddenly or may develop following an illness, especially if there is dehydration. Pain can also occur due to overexertion or exposure to extreme temperature changes. °· Frequent severe bacterial infections, especially certain types of pneumonia and meningitis. °· Pain and swelling in the hands and feet. °· Decreased activity.   °· Loss of appetite.   °· Change in behavior. °· Headaches. °· Seizures. °· Shortness of breath or difficulty breathing. °· Vision changes. °· Skin ulcers. °Those with the trait may not have symptoms or they may have mild symptoms.  °DIAGNOSIS  °Sickle cell anemia is diagnosed with blood tests that demonstrate the genetic trait. It is often diagnosed during the newborn period, due to mandatory testing nationwide. A variety of blood tests, X-rays, CT scans, MRI scans, ultrasounds, and lung function tests may also be done to monitor the condition. °TREATMENT  °Sickle cell anemia may be treated with: °· Medicines. You may be given pain medicines, antibiotic medicines (to treat and prevent infections) or medicines to increase the production of certain types of hemoglobin. °· Fluids. °· Oxygen. °· Blood transfusions. °HOME CARE INSTRUCTIONS  °· Drink enough fluid to keep your urine clear or pale yellow. Increase your fluid intake in hot weather and during  exercise. °· Do not smoke. Smoking lowers oxygen levels in the blood.   °· Only take over-the-counter or prescription medicines for pain, fever, or discomfort as directed by your health care provider. °· Take antibiotics as directed by your health care provider. Make sure you finish them it even if you start to feel better.   °· Take supplements as directed by your health care provider.   °· Consider wearing a medical alert bracelet. This tells anyone caring for you in an emergency of your condition.   °· When traveling, keep your medical information, health care provider's names, and the medicines you take with you at all times.   °· If you develop a fever, do not take medicines to reduce the fever right away. This could cover up a problem that is developing. Notify your health care provider. °· Keep all follow-up appointments with your health care provider. Sickle cell anemia requires regular medical care. °SEEK MEDICAL CARE IF: ° You have a fever. °SEEK IMMEDIATE MEDICAL CARE IF:  °· You feel dizzy or faint.   °· You have new abdominal pain, especially on the left side near the stomach area.   °· You develop a persistent,   often uncomfortable and painful penile erection (priapism). If this is not treated immediately it will lead to impotence.   °· You have numbness your arms or legs or you have a hard time moving them.   °· You have a hard time with speech.   °· You have a fever or persistent symptoms for more than 2-3 days.   °· You have a fever and your symptoms suddenly get worse.   °· You have signs or symptoms of infection. These include:   °¨ Chills.   °¨ Abnormal tiredness (lethargy).   °¨ Irritability.   °¨ Poor eating.   °¨ Vomiting.   °· You develop pain that is not helped with medicine.   °· You develop shortness of breath. °· You have pain in your chest.   °· You are coughing up pus-like or bloody sputum.   °· You develop a stiff neck. °· Your feet or hands swell or have pain. °· Your abdomen appears  bloated. °· You develop joint pain. °MAKE SURE YOU: °· Understand these instructions. °  °This information is not intended to replace advice given to you by your health care provider. Make sure you discuss any questions you have with your health care provider. °  °Document Released: 04/27/2005 Document Revised: 02/07/2014 Document Reviewed: 08/29/2012 °Elsevier Interactive Patient Education ©2016 Elsevier Inc. ° °

## 2015-05-19 NOTE — Progress Notes (Signed)
Patient is here for HFU  Patient complains of bilateral rib cage pain being present currently scaled at a 6. Pain is described as constant aching.

## 2015-05-19 NOTE — Patient Outreach (Signed)
[facility]) Care Management [place] Telephone outreach Transition of Care, attempt #2 05/19/2015  [name] April 25, 1980 [id number]  Unsuccessful telephone outreach attempt to [name] is a 35 y/o male, who had been followed by [place] for transition of care after recent IP hospital visit March 24-30, 2017, for Acute Renal Failure secondary to dehydration with sickle cell crisis, during which time patient was noted to have HTN and pulmonary HTN as well. Unfortunately, [name] was subsequently re-admitted to the hospital April 7-15, 2017 in sickle cell crisis.  HIPPA compliant VM msg left for patient along with my contact details, asking him to return my call. Will re-attempt call to patient tomorrow if I do not hear back from him before then.  Plan: [place] telephone outreach (attempt #3) tomorrow for transition of care  Oneta Rack, RN, BSN, [employer] Coordinator [facility] Care Management  [phone]

## 2015-05-19 NOTE — Progress Notes (Addendum)
Patient ID: [name], male   DOB: [birth year], 35 y.o.   MRN: [id number]  PCP: Doreene Burke  Associated Diagnosis: Sickle cell anemia with pain ([place])   In [facility] for port a cath access and IV Toradol and Dilaudid injection. Pain level 7/10 prior to injection. Pain level 5/10 at time of discharge. Port a cath flushed and deaccessed. Went over instructions. DC'd to home. Alert, oriented and ambulatory.

## 2015-05-19 NOTE — Discharge Instructions (Signed)
Sickle Cell Anemia, Adult Sickle cell anemia is a condition in which red blood cells have an abnormal "sickle" shape. This abnormal shape shortens the cells' life span, which results in a lower than normal concentration of red blood cells in the blood. The sickle shape also causes the cells to clump together and block free blood flow through the blood vessels. As a result, the tissues and organs of the body do not receive enough oxygen. Sickle cell anemia causes organ damage and pain and increases the risk of infection. CAUSES  Sickle cell anemia is a genetic disorder. Those who receive two copies of the gene have the condition, and those who receive one copy have the trait. RISK FACTORS The sickle cell gene is most common in people whose families originated in [place]. Other areas of the globe where sickle cell trait occurs include the [place], [place] and [place], the [place], and the [place].  SIGNS AND SYMPTOMS  Pain, especially in the extremities, back, chest, or abdomen (common). The pain may start suddenly or may develop following an illness, especially if there is dehydration. Pain can also occur due to overexertion or exposure to extreme temperature changes.  Frequent severe bacterial infections, especially certain types of pneumonia and meningitis.  Pain and swelling in the hands and feet.  Decreased activity.   Loss of appetite.   Change in behavior.  Headaches.  Seizures.  Shortness of breath or difficulty breathing.  Vision changes.  Skin ulcers. Those with the trait may not have symptoms or they may have mild symptoms.  DIAGNOSIS  Sickle cell anemia is diagnosed with blood tests that demonstrate the genetic trait. It is often diagnosed during the newborn period, due to mandatory testing nationwide. A variety of blood tests, X-rays, CT scans, MRI scans, ultrasounds, and lung function tests may also be done to monitor the condition. TREATMENT  Sickle  cell anemia may be treated with:  Medicines. You may be given pain medicines, antibiotic medicines (to treat and prevent infections) or medicines to increase the production of certain types of hemoglobin.  Fluids.  Oxygen.  Blood transfusions. HOME CARE INSTRUCTIONS   Drink enough fluid to keep your urine clear or pale yellow. Increase your fluid intake in hot weather and during exercise.  Do not smoke. Smoking lowers oxygen levels in the blood.   Only take over-the-counter or prescription medicines for pain, fever, or discomfort as directed by your health care provider.  Take antibiotics as directed by your health care provider. Make sure you finish them it even if you start to feel better.   Take supplements as directed by your health care provider.   Consider wearing a medical alert bracelet. This tells anyone caring for you in an emergency of your condition.   When traveling, keep your medical information, health care provider's names, and the medicines you take with you at all times.   If you develop a fever, do not take medicines to reduce the fever right away. This could cover up a problem that is developing. Notify your health care provider.  Keep all follow-up appointments with your health care provider. Sickle cell anemia requires regular medical care. SEEK MEDICAL CARE IF: You have a fever. SEEK IMMEDIATE MEDICAL CARE IF:   You feel dizzy or faint.   You have new abdominal pain, especially on the left side near the stomach area.   You develop a persistent, often uncomfortable and painful penile erection (priapism). If this is not treated immediately it   will lead to impotence.   You have numbness your arms or legs or you have a hard time moving them.   You have a hard time with speech.   You have a fever or persistent symptoms for more than 2-3 days.   You have a fever and your symptoms suddenly get worse.   You have signs or symptoms of infection.  These include:   Chills.   Abnormal tiredness (lethargy).   Irritability.   Poor eating.   Vomiting.   You develop pain that is not helped with medicine.   You develop shortness of breath.  You have pain in your chest.   You are coughing up pus-like or bloody sputum.   You develop a stiff neck.  Your feet or hands swell or have pain.  Your abdomen appears bloated.  You develop joint pain. MAKE SURE YOU:  Understand these instructions.   This information is not intended to replace advice given to you by your health care provider. Make sure you discuss any questions you have with your health care provider.   Document Released: 04/27/2005 Document Revised: 02/07/2014 Document Reviewed: 08/29/2012 Elsevier Interactive Patient Education 2016 [employer]  

## 2015-05-19 NOTE — Progress Notes (Signed)
Patient ID: [name], male   DOB: 1980/02/14, 35 y.o.   MRN: [id number]   [name], is a 35 y.o. male  [id number]  [id number]  DOB - 1980-06-04  Chief Complaint  Patient presents with  . Hospitalization Follow-up        Subjective:   [name] is a 35 y.o. male opiate tolerant patient with hemoglobin SS and multiple complications with frequent ED utilization and hospital admissions here today for a hospital discharge follow up visit. Patient still has his pain at about 6-7 out of 10, his baseline usually is about 4-5. His pain is usually in the limbs and reads, described as throbbing and intermittently sharp. He denies any fever, no cough, no shortness of breath, no nausea vomiting or diarrhea. Patient is asking for some form of help in form of injections for his pain today. Patient has No headache, No chest pain, No abdominal pain, No new weakness tingling or numbness.  No problems updated.  ALLERGIES: No Known Allergies  PAST MEDICAL HISTORY: Past Medical History  Diagnosis Date  . Sickle cell anemia (HCC)   . Blood transfusion   . Acute embolism and thrombosis of right internal jugular vein ([place])   . Hypokalemia   . Mood disorder ([place])   . History of pulmonary embolus (PE)   . Avascular necrosis (HCC)     Right Hip  . Leukocytosis     Chronic  . Thrombocytosis (HCC)     Chronic  . Hypertension   . History of Clostridium difficile infection   . Uses marijuana   . Chronic anticoagulation   . Functional asplenia   . Former smoker   . Second hand tobacco smoke exposure   . Alcohol consumption of one to four drinks per day   . Noncompliance with medication regimen   . Sickle-cell crisis with associated acute chest syndrome ([place]) 05/13/2013  . Acute chest syndrome ([place]) 06/18/2013  . Demand ischemia ([place]) 01/02/2014  . Pulmonary hypertension ([place])   . Hb-SS disease with crisis [facility])     MEDICATIONS AT HOME: Prior to Admission medications    Medication Sig Start Date End Date Taking? Authorizing Provider  aspirin 81 MG chewable tablet Chew 1 tablet (81 mg total) by mouth daily. 07/23/14  Yes [name], MD  Cholecalciferol (VITAMIN D) 2000 units tablet Take 1 tablet (2,000 Units total) by mouth daily. 02/23/15  Yes Dorena Dew, FNP  enoxaparin (LOVENOX) 120 MG/0.8ML injection Inject 0.8 mLs (120 mg total) into the skin daily. 12/03/14  Yes [name], MD  folic acid (FOLVITE) 1 MG tablet Take 1 tablet (1 mg total) by mouth every morning. 02/23/15  Yes Dorena Dew, FNP  gabapentin (NEURONTIN) 300 MG capsule Take 1 capsule (300 mg total) by mouth 3 (three) times daily. 05/04/15  Yes Dorena Dew, FNP  HYDROmorphone (DILAUDID) 4 MG tablet Take 1 tablet (4 mg total) by mouth every 4 (four) hours as needed for severe pain. 05/05/15  Yes Dorena Dew, FNP  hydroxyurea (HYDREA) 500 MG capsule Take 1 capsule (500 mg total) by mouth daily. May take with food to minimize GI side effects. Patient taking differently: Take 1,000 mg by mouth daily. May take with food to minimize GI side effects. 11/06/14  Yes Dorena Dew, FNP  lisinopril (PRINIVIL,ZESTRIL) 10 MG tablet Take 1 tablet (10 mg total) by mouth daily. 04/13/15  Yes Dorena Dew, FNP  metoprolol succinate (TOPROL-XL) 50 MG 24 hr  tablet Take 1 tablet (50 mg total) by mouth daily. 02/23/15  Yes Dorena Dew, FNP  morphine (MS CONTIN) 30 MG 12 hr tablet Take 1 tablet (30 mg total) by mouth every 12 (twelve) hours. 05/05/15  Yes Dorena Dew, FNP  potassium chloride SA (K-DUR,KLOR-CON) 20 MEQ tablet Take 1 tablet (20 mEq total) by mouth every morning. 04/13/15  Yes Dorena Dew, FNP  zolpidem (AMBIEN) 10 MG tablet Take 1 tablet (10 mg total) by mouth at bedtime as needed for sleep. 05/05/15  Yes Dorena Dew, FNP  cyclobenzaprine (FLEXERIL) 5 MG tablet Take 1 tablet (5 mg total) by mouth 3 (three) times daily as needed for muscle spasms. Patient not  taking: Reported on 05/07/2015 02/19/15   Alfonzo Beers, MD     Objective:   Filed Vitals:   05/19/15 1102  BP: 108/66  Pulse: 84  Temp: 98.8 F (37.1 C)  TempSrc: Oral  Resp: 18  Height: 6' (1.829 m)  Weight: 177 lb (80.287 kg)  SpO2: 96%    Exam General appearance : Awake, alert, not in any distress. Speech Clear. Not toxic looking HEENT: Atraumatic and Normocephalic, pupils equally reactive to light and accomodation Neck: supple, no JVD. No cervical lymphadenopathy.  Chest:Good air entry bilaterally, no added sounds  CVS: S1 S2 regular, no murmurs.  Abdomen: Bowel sounds present, Non tender and not distended with no gaurding, rigidity or rebound. Extremities: B/L Lower Ext shows no edema, both legs are warm to touch Neurology: Awake alert, and oriented X 3, CN II-XII intact, Non focal Skin: No Rash  Data Review No results found for: HGBA1C   Assessment & Plan   1. Sickle-cell disease with pain ([place])  Continue Hydrea. We discussed the need for good hydration, monitoring of hydration status, avoidance of heat, cold, stress, and infection triggers. We discussed the risks and benefits of Hydrea, including bone marrow suppression, the possibility of GI upset, skin ulcers, hair thinning, and teratogenicity. The patient was reminded of the need to seek medical attention of any symptoms of bleeding, anemia, or infection. Continue folic acid 1 mg daily to prevent aplastic bone marrow crises.    Acute and chronic painful episodes - Patient will be given 1 dose of IV Dilaudid 2 mg and Toradol 30 mg IV once in the clinic today, observed for 30 minutes after injection into the hospital.  We agreed on Opiate dose and amount of pills  per month. We discussed that pt is to receive Schedule II prescriptions only from our clinic. Pt is also aware that the prescription history is available to [place] online through the [facility] CSRS. Controlled substance agreement reviewed and signed. We reminded  [place] that all patients receiving Schedule II narcotics must be seen for follow within one month of prescription being requested. We reviewed the terms of our pain agreement, including the need to keep medicines in a safe locked location away from children or pets, and the need to report excess sedation or constipation, measures to avoid constipation, and policies related to early refills and stolen prescriptions. According to the [place] [facility] program, we have reviewed details related to analgesia, adverse effects and aberrant behaviors.  2. Essential hypertension  We have discussed target BP range and blood pressure goal. I have advised patient to check BP regularly and to call us back or report to clinic if the numbers are consistently higher than 140/90. We discussed the importance of compliance with medical therapy and DASH diet  recommended, consequences of uncontrolled hypertension discussed.   - continue current BP medications Patient have been counseled extensively about nutrition and exercise  Return in about 4 weeks (around 06/16/2015) for Sickle Cell Disease/Pain, Routine Follow Up.  The patient was given clear instructions to go to ER or return to medical center if symptoms don't improve, worsen or new problems develop. The patient verbalized understanding. The patient was told to call to get lab results if they haven't heard anything in the next week.   This note has been created with Surveyor, quantity. Any transcriptional errors are unintentional.    [name], MD, [place], Karilyn Cota, [place] and [place], [place]   05/19/2015, 11:41 AM

## 2015-05-20 ENCOUNTER — Other Ambulatory Visit: Payer: Self-pay | Admitting: *Deleted

## 2015-05-20 NOTE — Patient Outreach (Signed)
[facility]) Care Management    [place] Telephone outreach Transition of Care, attempt #3 05/20/2015  [name] 05/20/1980 [id number]  Unsuccessful telephone outreach attempt to [name], who is a 35 y/o male, who had been followed by [place] for transition of care after recent IP hospital visit March 24-30, 2017, for Acute Renal Failure secondary to dehydration with sickle cell crisis, during which time patient was noted to have HTN and pulmonary HTN as well. Unfortunately, [name] was subsequently re-admitted to the hospital April 7-15, 2017 in sickle cell crisis.  HIPPA compliant VM msg left for patient along with my contact details, asking him to return my call by Friday 05/22/15.  Plan: I will close this episode of [name] with a letter to [name], if not contacted back by the patient by Friday, May 22, 2015.   Oneta Rack, RN, BSN, [employer] [facility] Care Management  [phone]

## 2015-05-21 ENCOUNTER — Telehealth: Payer: Self-pay | Admitting: Hematology

## 2015-05-21 ENCOUNTER — Non-Acute Institutional Stay (HOSPITAL_COMMUNITY)
Admission: AD | Admit: 2015-05-21 | Discharge: 2015-05-21 | Disposition: A | Discharge status: Home / Self Care | Payer: Medicare Other | Source: Ambulatory Visit | Attending: Internal Medicine | Admitting: Internal Medicine

## 2015-05-21 ENCOUNTER — Encounter (HOSPITAL_COMMUNITY): Payer: Self-pay | Admitting: *Deleted

## 2015-05-21 DIAGNOSIS — D57819 Other sickle-cell disorders with crisis, unspecified: Secondary | ICD-10-CM | POA: Diagnosis not present

## 2015-05-21 DIAGNOSIS — Z87891 Personal history of nicotine dependence: Secondary | ICD-10-CM | POA: Insufficient documentation

## 2015-05-21 DIAGNOSIS — Z86718 Personal history of other venous thrombosis and embolism: Secondary | ICD-10-CM | POA: Insufficient documentation

## 2015-05-21 DIAGNOSIS — Z7982 Long term (current) use of aspirin: Secondary | ICD-10-CM | POA: Diagnosis not present

## 2015-05-21 DIAGNOSIS — D5700 Hb-SS disease with crisis, unspecified: Secondary | ICD-10-CM | POA: Diagnosis present

## 2015-05-21 DIAGNOSIS — Z9981 Dependence on supplemental oxygen: Secondary | ICD-10-CM | POA: Diagnosis not present

## 2015-05-21 DIAGNOSIS — Z79899 Other long term (current) drug therapy: Secondary | ICD-10-CM | POA: Diagnosis not present

## 2015-05-21 DIAGNOSIS — D571 Sickle-cell disease without crisis: Secondary | ICD-10-CM | POA: Diagnosis not present

## 2015-05-21 DIAGNOSIS — I272 Other secondary pulmonary hypertension: Secondary | ICD-10-CM | POA: Diagnosis not present

## 2015-05-21 DIAGNOSIS — Z9049 Acquired absence of other specified parts of digestive tract: Secondary | ICD-10-CM | POA: Diagnosis not present

## 2015-05-21 DIAGNOSIS — Z96641 Presence of right artificial hip joint: Secondary | ICD-10-CM | POA: Insufficient documentation

## 2015-05-21 DIAGNOSIS — Z86711 Personal history of pulmonary embolism: Secondary | ICD-10-CM | POA: Insufficient documentation

## 2015-05-21 DIAGNOSIS — I10 Essential (primary) hypertension: Secondary | ICD-10-CM | POA: Insufficient documentation

## 2015-05-21 LAB — COMPREHENSIVE METABOLIC PANEL
ALK PHOS: 89 U/L (ref 38–126)
ALT: 21 U/L (ref 17–63)
ANION GAP: 8 (ref 5–15)
AST: 41 U/L (ref 15–41)
Albumin: 4.4 g/dL (ref 3.5–5.0)
BILIRUBIN TOTAL: 3.4 mg/dL — AB (ref 0.3–1.2)
BUN: 18 mg/dL (ref 6–20)
CO2: 24 mmol/L (ref 22–32)
Calcium: 9.2 mg/dL (ref 8.9–10.3)
Chloride: 108 mmol/L (ref 101–111)
Creatinine, Ser: 0.94 mg/dL (ref 0.61–1.24)
GFR calc non Af Amer: >60 mL/min (ref >60)
Glucose, Bld: 126 mg/dL — ABNORMAL HIGH (ref 65–99)
Potassium: 4.7 mmol/L (ref 3.5–5.1)
Sodium: 140 mmol/L (ref 135–145)
TOTAL PROTEIN: 7.7 g/dL (ref 6.5–8.1)

## 2015-05-21 LAB — CBC WITH DIFFERENTIAL/PLATELET
BASOS ABS: 0 10*3/uL (ref 0.0–0.1)
Basophils Relative: 0 %
EOS ABS: 0.5 10*3/uL (ref 0.0–0.7)
Eosinophils Relative: 3 %
HCT: 20.1 % — ABNORMAL LOW (ref 39.0–52.0)
HEMOGLOBIN: 6.9 g/dL — AB (ref 13.0–17.0)
LYMPHS PCT: 14 %
Lymphs Abs: 2.5 10*3/uL (ref 0.7–4.0)
MCH: 30.5 pg (ref 26.0–34.0)
MCHC: 34.3 g/dL (ref 30.0–36.0)
MCV: 88.9 fL (ref 78.0–100.0)
Monocytes Absolute: 2.1 10*3/uL — ABNORMAL HIGH (ref 0.1–1.0)
Monocytes Relative: 12 %
NEUTROS PCT: 71 %
Neutro Abs: 12.8 10*3/uL — ABNORMAL HIGH (ref 1.7–7.7)
Platelets: 571 10*3/uL — ABNORMAL HIGH (ref 150–400)
RBC: 2.26 MIL/uL — ABNORMAL LOW (ref 4.22–5.81)
RDW: 24.1 % — ABNORMAL HIGH (ref 11.5–15.5)
WBC: 17.9 10*3/uL — ABNORMAL HIGH (ref 4.0–10.5)

## 2015-05-21 LAB — RETICULOCYTES
RBC.: 2.26 MIL/uL — AB (ref 4.22–5.81)
RETIC CT PCT: 11.8 % — AB (ref 0.4–3.1)
Retic Count, Absolute: 266.7 10*3/uL — ABNORMAL HIGH (ref 19.0–186.0)

## 2015-05-21 LAB — TYPE AND SCREEN
ABO/RH(D): O POS
ANTIBODY SCREEN: NEGATIVE

## 2015-05-21 MED ORDER — HYDROMORPHONE HCL 4 MG PO TABS
4.0000 mg | ORAL_TABLET | Freq: Once | ORAL | Status: AC
Start: 2015-05-21 — End: 2015-05-21
  Administered 2015-05-21: 4 mg via ORAL
  Filled 2015-05-21: qty 1

## 2015-05-21 MED ORDER — DIPHENHYDRAMINE HCL 12.5 MG/5ML PO ELIX
12.5000 mg | ORAL_SOLUTION | Freq: Four times a day (QID) | ORAL | Status: DC | PRN
  Administered 2015-05-21 (×2): 12.5 mg via ORAL
  Filled 2015-05-21 (×2): qty 5

## 2015-05-21 MED ORDER — ONDANSETRON HCL 4 MG/2ML IJ SOLN: 4.0000 mg | Freq: Four times a day (QID) | INTRAMUSCULAR | Status: DC | PRN

## 2015-05-21 MED ORDER — SODIUM CHLORIDE 0.9% FLUSH
10.0000 mL | INTRAVENOUS | Status: AC | PRN
  Administered 2015-05-21: 10 mL

## 2015-05-21 MED ORDER — NALOXONE HCL 0.4 MG/ML IJ SOLN: 0.4000 mg | INTRAMUSCULAR | Status: DC | PRN

## 2015-05-21 MED ORDER — HEPARIN SOD (PORK) LOCK FLUSH 100 UNIT/ML IV SOLN: 500.0000 [IU] | INTRAVENOUS | Status: DC | PRN

## 2015-05-21 MED ORDER — HEPARIN SOD (PORK) LOCK FLUSH 100 UNIT/ML IV SOLN
500.0000 [IU] | INTRAVENOUS | Status: AC | PRN
  Administered 2015-05-21: 500 [IU]
  Filled 2015-05-21: qty 5

## 2015-05-21 MED ORDER — DIPHENHYDRAMINE HCL 50 MG/ML IJ SOLN
12.5000 mg | Freq: Four times a day (QID) | INTRAMUSCULAR | Status: DC | PRN
  Filled 2015-05-21: qty 0.25

## 2015-05-21 MED ORDER — HYDROMORPHONE 1 MG/ML IV SOLN
INTRAVENOUS | Status: DC
  Administered 2015-05-21: 16.1 mg via INTRAVENOUS
  Administered 2015-05-21: 10:00:00 via INTRAVENOUS
  Filled 2015-05-21: qty 25

## 2015-05-21 MED ORDER — KETOROLAC TROMETHAMINE 30 MG/ML IJ SOLN
30.0000 mg | Freq: Once | INTRAMUSCULAR | Status: AC
  Administered 2015-05-21: 30 mg via INTRAVENOUS
  Filled 2015-05-21: qty 1

## 2015-05-21 MED ORDER — SODIUM CHLORIDE 0.9% FLUSH: 9.0000 mL | INTRAVENOUS | Status: DC | PRN

## 2015-05-21 MED ORDER — SODIUM CHLORIDE 0.9% FLUSH: 10.0000 mL | INTRAVENOUS | Status: DC | PRN

## 2015-05-21 MED ORDER — DEXTROSE-NACL 5-0.45 % IV SOLN
INTRAVENOUS | Status: DC
  Administered 2015-05-21: 150 mL/h via INTRAVENOUS

## 2015-05-21 NOTE — Progress Notes (Signed)
CRITICAL VALUE ALERT  Critical value received:  Hemoglobin 6.9 Date of notification:  05/21/2015 Time of notification: [id number] Critical value read back:Yes  Nurse who received alert:  Roberto Scales, RN  [place], [place] notified verbally

## 2015-05-21 NOTE — Discharge Summary (Signed)
[facility] Discharge Summary   Patient ID: [name] MRN: [id number] DOB/AGE: [birth year] 36 y.o.  Admit date: 05/21/2015 Discharge date: 05/21/2015  Primary Care Physician:  [name], MD  Admission Diagnoses:  Active Problems:   Sickle-cell disease with pain [facility])  Discharge Medications:    Medication List    ASK your doctor about these medications        aspirin 81 MG chewable tablet  Chew 1 tablet (81 mg total) by mouth daily.     cyclobenzaprine 5 MG tablet  Commonly known as:  FLEXERIL  Take 1 tablet (5 mg total) by mouth 3 (three) times daily as needed for muscle spasms.     enoxaparin 120 MG/0.8ML injection  Commonly known as:  LOVENOX  Inject 0.8 mLs (120 mg total) into the skin daily.     folic acid 1 MG tablet  Commonly known as:  FOLVITE  Take 1 tablet (1 mg total) by mouth every morning.     gabapentin 300 MG capsule  Commonly known as:  NEURONTIN  Take 1 capsule (300 mg total) by mouth 3 (three) times daily.     HYDROmorphone 4 MG tablet  Commonly known as:  DILAUDID  Take 1 tablet (4 mg total) by mouth every 4 (four) hours as needed for severe pain.     hydroxyurea 500 MG capsule  Commonly known as:  HYDREA  Take 1 capsule (500 mg total) by mouth daily. May take with food to minimize GI side effects.     lisinopril 10 MG tablet  Commonly known as:  PRINIVIL,ZESTRIL  Take 1 tablet (10 mg total) by mouth daily.     metoprolol succinate 50 MG 24 hr tablet  Commonly known as:  TOPROL-XL  Take 1 tablet (50 mg total) by mouth daily.     morphine 30 MG 12 hr tablet  Commonly known as:  MS CONTIN  Take 1 tablet (30 mg total) by mouth every 12 (twelve) hours.     potassium chloride SA 20 MEQ tablet  Commonly known as:  K-DUR,KLOR-CON  Take 1 tablet (20 mEq total) by mouth every morning.     Vitamin D 2000 units tablet  Take 1 tablet (2,000 Units total) by mouth daily.     zolpidem 10 MG tablet  Commonly known  as:  AMBIEN  Take 1 tablet (10 mg total) by mouth at bedtime as needed for sleep.         Consults:  None  Significant Diagnostic Studies:  Dg Chest 2 View  05/07/2015  CLINICAL DATA:  Sickle cell pain crisis, chest pain EXAM: CHEST  2 VIEW COMPARISON:  04/26/2015 FINDINGS: Left subclavian power port catheter tip SVC RA junction. Stable cardiomegaly with vascular congestion and basilar atelectasis/ parenchymal scarring. No superimposed pneumonia, collapse or consolidation. No edema, effusion or pneumothorax. Trachea midline. IMPRESSION: Stable cardiomegaly with vascular congestion and parenchymal scarring. No superimposed acute process. Electronically Signed   By: Jerilynn Mages.  Shick M.D.   On: 05/07/2015 09:05   Dg Chest 2 View  04/26/2015  CLINICAL DATA:  BILATERAL flank pain for 3 days. Sickle cell crisis. EXAM: CHEST  2 VIEW COMPARISON:  04/24/2015. FINDINGS: Cardiomegaly. Port-A-Cath unchanged, tip in RIGHT atrium. Mild vascular congestion. No infiltrates, or effusion. Sickle cell changes in the visualized osseous structures. IMPRESSION: Cardiomegaly.  No acute abnormality noted. Electronically Signed   By: Staci Righter M.D.   On: 04/26/2015 09:45   Dg Chest 2 View  04/24/2015  CLINICAL DATA:  Sickle cell crisis with chest pain, initial encounter EXAM: CHEST  2 VIEW COMPARISON:  03/19/2015 FINDINGS: Cardiac shadow is enlarged. A left chest wall port is again seen and stable. The lungs are well aerated bilaterally. No acute bony abnormality is seen. IMPRESSION: No acute abnormality noted. Electronically Signed   By: Inez Catalina M.D.   On: 04/24/2015 09:59     [facility] Course: [name], was admitted to the day infusion center for extended observation.  [name] was on 2 Liters of oxygen at home, continued O2 at 2 liters.  Reviewed labs, consistent with baseline.  He was started on high concentration PCA dilaudid. He used a total of 16.1 mg with 27 demands and 23  deliveries. Pain intensity decreased to 5/10.  [name] says that he can manage at home on current medication regimen. Reminded to continue to hydrate consistently.  Patient was reminded to follow up in office as scheduled.  The patient was given clear instructions to go to ER or return to medical center if symptoms do not improve, worsen or new problems develop. The patient verbalized understanding.   Physical Exam at Discharge:  BP 121/68 mmHg  Pulse 89  Temp(Src) 98.5 F (36.9 C) (Oral)  Resp 13  Wt 177 lb (80.287 kg)  SpO2 93%  General Appearance:    Alert, cooperative, no distress, appears stated age  Head:    Normocephalic, without obvious abnormality, atraumatic  Eyes:    PERRL, conjunctiva/corneas clear, EOM's intact, fundi    benign, both eyes       Lungs:     Clear to auscultation bilaterally, respirations unlabored  Chest wall:    No tenderness or deformity  Heart:    Regular rate and rhythm, S1 and S2 normal, no murmur, rub   or gallop  Abdomen:     Soft, non-tender, bowel sounds active all four quadrants,    no masses, no organomegaly  Pulses:   2+ and symmetric all extremities  Skin:   Skin color, texture, turgor normal, no rashes or lesions  Lymph nodes:   Cervical, supraclavicular, and axillary nodes normal  Neurologic:   CNII-XII intact. Normal strength, sensation and reflexes      throughout   Disposition at Discharge: 01-Home or Self Care  Discharge Orders:   Condition at Discharge:   Stable  Time spent on Discharge:  15 minutes  Signed: Demetrias [name] M 05/21/2015, 2:48 PM

## 2015-05-21 NOTE — Discharge Instructions (Signed)
Sickle Cell Anemia, Adult Sickle cell anemia is a condition in which red blood cells have an abnormal "sickle" shape. This abnormal shape shortens the cells' life span, which results in a lower than normal concentration of red blood cells in the blood. The sickle shape also causes the cells to clump together and block free blood flow through the blood vessels. As a result, the tissues and organs of the body do not receive enough oxygen. Sickle cell anemia causes organ damage and pain and increases the risk of infection. CAUSES  Sickle cell anemia is a genetic disorder. Those who receive two copies of the gene have the condition, and those who receive one copy have the trait. RISK FACTORS The sickle cell gene is most common in people whose families originated in [place]. Other areas of the globe where sickle cell trait occurs include the [place], [place] and [place], the [place], and the [place].  SIGNS AND SYMPTOMS  Pain, especially in the extremities, back, chest, or abdomen (common). The pain may start suddenly or may develop following an illness, especially if there is dehydration. Pain can also occur due to overexertion or exposure to extreme temperature changes.  Frequent severe bacterial infections, especially certain types of pneumonia and meningitis.  Pain and swelling in the hands and feet.  Decreased activity.   Loss of appetite.   Change in behavior.  Headaches.  Seizures.  Shortness of breath or difficulty breathing.  Vision changes.  Skin ulcers. Those with the trait may not have symptoms or they may have mild symptoms.  DIAGNOSIS  Sickle cell anemia is diagnosed with blood tests that demonstrate the genetic trait. It is often diagnosed during the newborn period, due to mandatory testing nationwide. A variety of blood tests, X-rays, CT scans, MRI scans, ultrasounds, and lung function tests may also be done to monitor the condition. TREATMENT  Sickle  cell anemia may be treated with:  Medicines. You may be given pain medicines, antibiotic medicines (to treat and prevent infections) or medicines to increase the production of certain types of hemoglobin.  Fluids.  Oxygen.  Blood transfusions. HOME CARE INSTRUCTIONS   Drink enough fluid to keep your urine clear or pale yellow. Increase your fluid intake in hot weather and during exercise.  Do not smoke. Smoking lowers oxygen levels in the blood.   Only take over-the-counter or prescription medicines for pain, fever, or discomfort as directed by your health care provider.  Take antibiotics as directed by your health care provider. Make sure you finish them it even if you start to feel better.   Take supplements as directed by your health care provider.   Consider wearing a medical alert bracelet. This tells anyone caring for you in an emergency of your condition.   When traveling, keep your medical information, health care provider's names, and the medicines you take with you at all times.   If you develop a fever, do not take medicines to reduce the fever right away. This could cover up a problem that is developing. Notify your health care provider.  Keep all follow-up appointments with your health care provider. Sickle cell anemia requires regular medical care. SEEK MEDICAL CARE IF: You have a fever. SEEK IMMEDIATE MEDICAL CARE IF:   You feel dizzy or faint.   You have new abdominal pain, especially on the left side near the stomach area.   You develop a persistent, often uncomfortable and painful penile erection (priapism). If this is not treated immediately it   will lead to impotence.   You have numbness your arms or legs or you have a hard time moving them.   You have a hard time with speech.   You have a fever or persistent symptoms for more than 2-3 days.   You have a fever and your symptoms suddenly get worse.   You have signs or symptoms of infection.  These include:   Chills.   Abnormal tiredness (lethargy).   Irritability.   Poor eating.   Vomiting.   You develop pain that is not helped with medicine.   You develop shortness of breath.  You have pain in your chest.   You are coughing up pus-like or bloody sputum.   You develop a stiff neck.  Your feet or hands swell or have pain.  Your abdomen appears bloated.  You develop joint pain. MAKE SURE YOU:  Understand these instructions.   This information is not intended to replace advice given to you by your health care provider. Make sure you discuss any questions you have with your health care provider.   Document Released: 04/27/2005 Document Revised: 02/07/2014 Document Reviewed: 08/29/2012 Elsevier Interactive Patient Education 2016 [employer]  

## 2015-05-21 NOTE — H&P (Signed)
[facility] History and Physical   Date: 05/21/2015  Patient name: [name] Medical record number: [id number] Date of birth: July 22, 1980 Age: 35 y.o. Gender: male PCP: [name], MD  Attending physician: Tresa Garter, MD  Chief Complaint: Bilateral flank pain  History of Present Illness: [name], a 35 year old male with a history of sickle cell anemia, HbSS presents complaining of right and left flank pain that is consistent with sickle cell anemia.  He maintains that pain started several days ago and has not been controlled on home medication regimen since that time. He has not identified any palliative or provocative factors attributing to crisis.  Current pain intensity is 8/10 described as constant and throbbing. He last had Dilaudid 4 mg around 7 am with minimal relief. He is taking all other medications consistently. He denies headache, chest pains, fatigue, dysuria, nausea, vomiting, or diarrhea.  Meds: Prescriptions prior to admission  Medication Sig Dispense Refill Last Dose  . aspirin 81 MG chewable tablet Chew 1 tablet (81 mg total) by mouth daily. 30 tablet 11 05/20/2015 at Unknown time  . Cholecalciferol (VITAMIN D) 2000 units tablet Take 1 tablet (2,000 Units total) by mouth daily. 90 tablet 3 05/20/2015 at Unknown time  . enoxaparin (LOVENOX) 120 MG/0.8ML injection Inject 0.8 mLs (120 mg total) into the skin daily. 30 Syringe 2 05/20/2015 at Unknown time  . folic acid (FOLVITE) 1 MG tablet Take 1 tablet (1 mg total) by mouth every morning. 30 tablet 11 05/20/2015 at Unknown time  . gabapentin (NEURONTIN) 300 MG capsule Take 1 capsule (300 mg total) by mouth 3 (three) times daily. 30 capsule 0 05/20/2015 at Unknown time  . HYDROmorphone (DILAUDID) 4 MG tablet Take 1 tablet (4 mg total) by mouth every 4 (four) hours as needed for severe pain. 90 tablet 0 05/21/2015 at Unknown time  . hydroxyurea (HYDREA) 500 MG capsule Take 1  capsule (500 mg total) by mouth daily. May take with food to minimize GI side effects. (Patient taking differently: Take 1,000 mg by mouth daily. May take with food to minimize GI side effects.) 30 capsule 0 05/20/2015 at Unknown time  . lisinopril (PRINIVIL,ZESTRIL) 10 MG tablet Take 1 tablet (10 mg total) by mouth daily. 30 tablet 5 05/20/2015 at Unknown time  . metoprolol succinate (TOPROL-XL) 50 MG 24 hr tablet Take 1 tablet (50 mg total) by mouth daily. 30 tablet 3 05/20/2015 at Unknown time  . morphine (MS CONTIN) 30 MG 12 hr tablet Take 1 tablet (30 mg total) by mouth every 12 (twelve) hours. 60 tablet 0 05/21/2015 at Unknown time  . potassium chloride SA (K-DUR,KLOR-CON) 20 MEQ tablet Take 1 tablet (20 mEq total) by mouth every morning. 30 tablet 3 05/20/2015 at Unknown time  . zolpidem (AMBIEN) 10 MG tablet Take 1 tablet (10 mg total) by mouth at bedtime as needed for sleep. 30 tablet 0 05/20/2015 at Unknown time  . cyclobenzaprine (FLEXERIL) 5 MG tablet Take 1 tablet (5 mg total) by mouth 3 (three) times daily as needed for muscle spasms. (Patient not taking: Reported on 05/07/2015) 20 tablet 0 Not Taking    Allergies: Review of patient's allergies indicates no known allergies. Past Medical History  Diagnosis Date  . Sickle cell anemia (HCC)   . Blood transfusion   . Acute embolism and thrombosis of right internal jugular vein ([place])   . Hypokalemia   . Mood disorder ([place])   .  History of pulmonary embolus (PE)   . Avascular necrosis (HCC)     Right Hip  . Leukocytosis     Chronic  . Thrombocytosis (HCC)     Chronic  . Hypertension   . History of Clostridium difficile infection   . Uses marijuana   . Chronic anticoagulation   . Functional asplenia   . Former smoker   . Second hand tobacco smoke exposure   . Alcohol consumption of one to four drinks per day   . Noncompliance with medication regimen   . Sickle-cell crisis with associated acute chest syndrome ([place]) 05/13/2013  . Acute  chest syndrome ([place]) 06/18/2013  . Demand ischemia ([place]) 01/02/2014  . Pulmonary hypertension ([place])   . Hb-SS disease with crisis [facility])    Past Surgical History  Procedure Laterality Date  . Right hip replacement      08/2006  . Cholecystectomy      01/2008  . Porta cath placement    . Porta cath removal    . Umbilical hernia repair      01/2008  . Excision of left periauricular cyst      10/2009  . Excision of right ear lobe cyst with primary closur      11/2007  . Portacath placement  01/05/2012    Procedure: INSERTION PORT-A-CATH;  Surgeon: Odis Hollingshead, MD;  Location: [place];  Service: General;  Laterality: N/A;  ultrasound guiced port a cath insertion with fluoroscopy   Family History  Problem Relation Age of Onset  . Sickle cell trait Mother   . Depression Mother   . Diabetes Mother   . Sickle cell trait Father   . Sickle cell trait Brother    Social History   Social History  . Marital Status: Single    Spouse Name: N/A  . Number of Children: 0  . Years of Education: 13   Occupational History  . 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 Smoking status: Former Smoker -- 13 years  . Smokeless tobacco: Never Used  . Alcohol Use: No  . Drug Use: 2.00 per week    Special: Marijuana     Comment: Marijuana weekly  . Sexual Activity:    Partners: Female     Comment: month ago   Other Topics Concern  . Not on file   Social History Narrative   Lives in an apartment.  Single.  Lives alone but has a girlfriend that helps care for him.  Does not use any assist devices.        [name]  [phone] Mom, emergency contact      [facility]:   Patient continuing to live in her apartment in town alone. 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    Review of Systems: Eyes: positive for icterus Ears, nose, mouth, throat, and face: negative Respiratory: positive for dyspnea on exertion, negative for  emphysema, sputum and wheezing Cardiovascular: negative Gastrointestinal: negative for abdominal pain, constipation and dysphagia Genitourinary:positive for dysuria and frequency Integument/breast: negative Hematologic/lymphatic: negative Musculoskeletal:positive for bone pain and myalgias Neurological: negative Behavioral/Psych: negative Endocrine: negative  Physical Exam: Blood pressure 111/67, pulse 87, temperature 98.5 F (36.9 C), temperature source Oral, resp. rate 22, weight 177 lb (80.287 kg), SpO2 96 %.   General Appearance:    Alert, cooperative, mild distress, appears stated age  Head:    Normocephalic, without obvious abnormality,  atraumatic  Eyes:    PERRL, conjunctiva/corneas clear, EOM's intact, fundi    benign, scleral icterus      Ears:    Normal TM's and external ear canals, both ears  Nose:   Nares normal, septum midline, mucosa normal, no drainage    or sinus tenderness  Throat:   Lips, mucosa, and tongue normal; teeth and gums normal  Neck:   Supple, symmetrical, trachea midline, no adenopathy;       thyroid:  No enlargement/tenderness/nodules; no carotid   bruit or JVD  Back:     Symmetric, no curvature, ROM normal, no CVA tenderness  Lungs:     Clear to auscultation bilaterally, respirations unlabored  Chest wall:    No tenderness or deformity  Heart:    Regular rate and rhythm, S1 and S2 normal, Murmur, rub   or gallop  Extremities:   Extremities normal, atraumatic, no cyanosis or edema  Pulses:   2+ and symmetric all extremities  Skin:   Skin color, texture, turgor normal, no rashes or lesions  Lymph nodes:   Cervical, supraclavicular, and axillary nodes normal  Neurologic:   CNII-XII intact. Normal strength, sensation and reflexes      throughout    Lab results: No results found. However, due to the size of the patient record, not all encounters were searched. Please check Results Review for a complete set of results.  Imaging results:  No results  found. Assessment & Plan:  Patient will be admitted to the day infusion center for extended observation  Start IV D5.45 for cellular rehydration at 125/hr  Start Toradol 30 mg IV times one for inflammation.  Start high concentration dilaudid PCA  Patient will be re-evaluated for pain intensity in the context of function and relationship to             baseline as care progresses.  If no significant pain relief, will transfer patient to inpatient services for a higher level of care.   Start oxygen via n/c at 2 liters per home dose  CBC w/differential, CMP, and reticulocytes   Demani Mcbrien M 05/21/2015, 9:42 AM

## 2015-05-21 NOTE — Progress Notes (Signed)
Patient ID: [name], male   DOB: [birth year], 35 y.o.   MRN: [id number] Discharge instructions given to patient.  Questions answered.  Patient states pain is currently 5/10 on pain scale and he can manage at home.  Port flushed and de-accessed per policy.  Patient ambulatory with home oxygen.

## 2015-05-21 NOTE — Telephone Encounter (Signed)
Patient C/O pain to rib cage.  Rates pain 9/10 on pain scale. Patient denies chest pain, shortness of breath, N/V/D and denies abdominal pain.  Patient states he last took pain medication around 0530 this morning.  Placed caller on hold and spoke with [place], NP.  Advised patient it is ok for him to come to [facility].  Patient verbalizes understanding.

## 2015-05-22 ENCOUNTER — Encounter: Payer: Self-pay | Admitting: *Deleted

## 2015-05-27 ENCOUNTER — Encounter (HOSPITAL_COMMUNITY): Payer: Self-pay | Admitting: *Deleted

## 2015-05-27 ENCOUNTER — Non-Acute Institutional Stay (HOSPITAL_COMMUNITY)
Admission: AD | Admit: 2015-05-27 | Discharge: 2015-05-27 | Disposition: A | Discharge status: Home / Self Care | Payer: Medicare Other | Source: Ambulatory Visit | Attending: Internal Medicine | Admitting: Internal Medicine

## 2015-05-27 ENCOUNTER — Telehealth (HOSPITAL_COMMUNITY): Payer: Self-pay | Admitting: *Deleted

## 2015-05-27 DIAGNOSIS — Z79899 Other long term (current) drug therapy: Secondary | ICD-10-CM | POA: Diagnosis not present

## 2015-05-27 DIAGNOSIS — Z7901 Long term (current) use of anticoagulants: Secondary | ICD-10-CM | POA: Insufficient documentation

## 2015-05-27 DIAGNOSIS — I272 Other secondary pulmonary hypertension: Secondary | ICD-10-CM | POA: Diagnosis not present

## 2015-05-27 DIAGNOSIS — Z86711 Personal history of pulmonary embolism: Secondary | ICD-10-CM | POA: Diagnosis not present

## 2015-05-27 DIAGNOSIS — Z7982 Long term (current) use of aspirin: Secondary | ICD-10-CM | POA: Insufficient documentation

## 2015-05-27 DIAGNOSIS — Z9049 Acquired absence of other specified parts of digestive tract: Secondary | ICD-10-CM | POA: Insufficient documentation

## 2015-05-27 DIAGNOSIS — Z79891 Long term (current) use of opiate analgesic: Secondary | ICD-10-CM | POA: Diagnosis not present

## 2015-05-27 DIAGNOSIS — Z96641 Presence of right artificial hip joint: Secondary | ICD-10-CM | POA: Diagnosis not present

## 2015-05-27 DIAGNOSIS — D5700 Hb-SS disease with crisis, unspecified: Secondary | ICD-10-CM | POA: Diagnosis not present

## 2015-05-27 DIAGNOSIS — Z86718 Personal history of other venous thrombosis and embolism: Secondary | ICD-10-CM | POA: Diagnosis not present

## 2015-05-27 DIAGNOSIS — I10 Essential (primary) hypertension: Secondary | ICD-10-CM | POA: Insufficient documentation

## 2015-05-27 DIAGNOSIS — Z87891 Personal history of nicotine dependence: Secondary | ICD-10-CM | POA: Insufficient documentation

## 2015-05-27 HISTORY — DX: Hypoxemia: R09.02

## 2015-05-27 LAB — CBC WITH DIFFERENTIAL/PLATELET
BASOS ABS: 0 10*3/uL (ref 0.0–0.1)
Basophils Relative: 0 %
EOS ABS: 0.5 10*3/uL (ref 0.0–0.7)
Eosinophils Relative: 3 %
HCT: 20.5 % — ABNORMAL LOW (ref 39.0–52.0)
HEMOGLOBIN: 7.1 g/dL — AB (ref 13.0–17.0)
LYMPHS PCT: 10 %
Lymphs Abs: 1.5 10*3/uL (ref 0.7–4.0)
MCH: 30.6 pg (ref 26.0–34.0)
MCHC: 34.6 g/dL (ref 30.0–36.0)
MCV: 88.4 fL (ref 78.0–100.0)
Monocytes Absolute: 1.7 10*3/uL — ABNORMAL HIGH (ref 0.1–1.0)
Monocytes Relative: 11 %
NEUTROS PCT: 76 %
Neutro Abs: 11.4 10*3/uL — ABNORMAL HIGH (ref 1.7–7.7)
Platelets: 532 10*3/uL — ABNORMAL HIGH (ref 150–400)
RBC: 2.32 MIL/uL — AB (ref 4.22–5.81)
RDW: 24.3 % — ABNORMAL HIGH (ref 11.5–15.5)
WBC: 15.1 10*3/uL — AB (ref 4.0–10.5)

## 2015-05-27 LAB — COMPREHENSIVE METABOLIC PANEL
ALBUMIN: 4.4 g/dL (ref 3.5–5.0)
ALK PHOS: 88 U/L (ref 38–126)
ALT: 25 U/L (ref 17–63)
AST: 45 U/L — ABNORMAL HIGH (ref 15–41)
Anion gap: 9 (ref 5–15)
BUN: 14 mg/dL (ref 6–20)
CALCIUM: 9 mg/dL (ref 8.9–10.3)
CHLORIDE: 106 mmol/L (ref 101–111)
CO2: 21 mmol/L — AB (ref 22–32)
CREATININE: 0.85 mg/dL (ref 0.61–1.24)
GFR calc Af Amer: >60 mL/min (ref >60)
GFR calc non Af Amer: >60 mL/min (ref >60)
GLUCOSE: 126 mg/dL — AB (ref 65–99)
Potassium: 4.3 mmol/L (ref 3.5–5.1)
SODIUM: 136 mmol/L (ref 135–145)
Total Bilirubin: 6.6 mg/dL — ABNORMAL HIGH (ref 0.3–1.2)
Total Protein: 8 g/dL (ref 6.5–8.1)

## 2015-05-27 MED ORDER — ONDANSETRON HCL 4 MG/2ML IJ SOLN: 4.0000 mg | Freq: Four times a day (QID) | INTRAMUSCULAR | Status: DC | PRN

## 2015-05-27 MED ORDER — HEPARIN SOD (PORK) LOCK FLUSH 100 UNIT/ML IV SOLN
500.0000 [IU] | INTRAVENOUS | Status: AC | PRN
  Administered 2015-05-27: 500 [IU]
  Filled 2015-05-27: qty 5

## 2015-05-27 MED ORDER — DIPHENHYDRAMINE HCL 12.5 MG/5ML PO ELIX: 12.5000 mg | ORAL_SOLUTION | Freq: Four times a day (QID) | ORAL | Status: DC | PRN

## 2015-05-27 MED ORDER — HYDROMORPHONE HCL 4 MG PO TABS
4.0000 mg | ORAL_TABLET | Freq: Once | ORAL | Status: AC
  Administered 2015-05-27: 4 mg via ORAL
  Filled 2015-05-27: qty 1

## 2015-05-27 MED ORDER — SODIUM CHLORIDE 0.9% FLUSH: 9.0000 mL | INTRAVENOUS | Status: DC | PRN

## 2015-05-27 MED ORDER — NALOXONE HCL 0.4 MG/ML IJ SOLN: 0.4000 mg | INTRAMUSCULAR | Status: DC | PRN

## 2015-05-27 MED ORDER — DEXTROSE-NACL 5-0.45 % IV SOLN
INTRAVENOUS | Status: DC
  Administered 2015-05-27: 10:00:00 via INTRAVENOUS

## 2015-05-27 MED ORDER — HYDROMORPHONE 1 MG/ML IV SOLN
INTRAVENOUS | Status: DC
  Administered 2015-05-27: 10:00:00 via INTRAVENOUS
  Administered 2015-05-27: 5.6 mg via INTRAVENOUS
  Administered 2015-05-27: 10.5 mg via INTRAVENOUS
  Filled 2015-05-27: qty 25

## 2015-05-27 MED ORDER — KETOROLAC TROMETHAMINE 30 MG/ML IJ SOLN
30.0000 mg | Freq: Once | INTRAMUSCULAR | Status: AC
  Administered 2015-05-27: 30 mg via INTRAVENOUS
  Filled 2015-05-27: qty 1

## 2015-05-27 MED ORDER — SODIUM CHLORIDE 0.9% FLUSH
10.0000 mL | INTRAVENOUS | Status: AC | PRN
  Administered 2015-05-27: 10 mL

## 2015-05-27 MED ORDER — DIPHENHYDRAMINE HCL 50 MG/ML IJ SOLN
12.5000 mg | Freq: Four times a day (QID) | INTRAMUSCULAR | Status: DC | PRN
  Filled 2015-05-27: qty 0.25

## 2015-05-27 NOTE — H&P (Signed)
[facility] History and Physical   Date: 05/27/2015  Patient name: [name] Medical record number: [id number] Date of birth: 1981-01-08 Age: 35 y.o. Gender: male PCP: [name], MD  Attending physician: Tresa Garter, MD  Chief Complaint: Generalized pain  History of Present Illness: [name], a 35 year old male with a history of sickle cell anemia, HbSS presents complaining of generalized pain.  He maintains that pain started several days ago and has not been controlled on home medication regimen since that time. He has not identified any palliative or provocative factors attributing to crisis.  Current pain intensity is 8/10 described as constant and throbbing. He last had Dilaudid 4 mg around 6 am with minimal relief. He is taking all other medications consistently. He denies headache, chest pains, fatigue, dysuria, nausea, vomiting, or diarrhea.   Meds: Prescriptions prior to admission  Medication Sig Dispense Refill Last Dose  . aspirin 81 MG chewable tablet Chew 1 tablet (81 mg total) by mouth daily. 30 tablet 11 05/26/2015 at Unknown time  . Cholecalciferol (VITAMIN D) 2000 units tablet Take 1 tablet (2,000 Units total) by mouth daily. 90 tablet 3 05/26/2015 at Unknown time  . enoxaparin (LOVENOX) 120 MG/0.8ML injection Inject 0.8 mLs (120 mg total) into the skin daily. 30 Syringe 2 05/26/2015 at Unknown time  . folic acid (FOLVITE) 1 MG tablet Take 1 tablet (1 mg total) by mouth every morning. 30 tablet 11 05/26/2015 at Unknown time  . gabapentin (NEURONTIN) 300 MG capsule Take 1 capsule (300 mg total) by mouth 3 (three) times daily. 30 capsule 0 05/26/2015 at Unknown time  . HYDROmorphone (DILAUDID) 4 MG tablet Take 1 tablet (4 mg total) by mouth every 4 (four) hours as needed for severe pain. 90 tablet 0 05/27/2015 at Unknown time  . hydroxyurea (HYDREA) 500 MG capsule Take 1 capsule (500 mg total) by mouth daily. May take  with food to minimize GI side effects. (Patient taking differently: Take 1,000 mg by mouth daily. May take with food to minimize GI side effects.) 30 capsule 0 05/26/2015 at Unknown time  . lisinopril (PRINIVIL,ZESTRIL) 10 MG tablet Take 1 tablet (10 mg total) by mouth daily. 30 tablet 5 05/26/2015 at Unknown time  . metoprolol succinate (TOPROL-XL) 50 MG 24 hr tablet Take 1 tablet (50 mg total) by mouth daily. 30 tablet 3 05/26/2015 at Unknown time  . morphine (MS CONTIN) 30 MG 12 hr tablet Take 1 tablet (30 mg total) by mouth every 12 (twelve) hours. 60 tablet 0 05/27/2015 at Unknown time  . potassium chloride SA (K-DUR,KLOR-CON) 20 MEQ tablet Take 1 tablet (20 mEq total) by mouth every morning. 30 tablet 3 05/26/2015 at Unknown time  . zolpidem (AMBIEN) 10 MG tablet Take 1 tablet (10 mg total) by mouth at bedtime as needed for sleep. 30 tablet 0 05/26/2015 at Unknown time  . cyclobenzaprine (FLEXERIL) 5 MG tablet Take 1 tablet (5 mg total) by mouth 3 (three) times daily as needed for muscle spasms. (Patient not taking: Reported on 05/07/2015) 20 tablet 0 Not Taking    Allergies: Review of patient's allergies indicates no known allergies. Past Medical History  Diagnosis Date  . Sickle cell anemia (HCC)   . Blood transfusion   . Acute embolism and thrombosis of right internal jugular vein ([place])   . Hypokalemia   . Mood disorder ([place])   . History of pulmonary embolus (PE)   .  Avascular necrosis (HCC)     Right Hip  . Leukocytosis     Chronic  . Thrombocytosis (HCC)     Chronic  . Hypertension   . History of Clostridium difficile infection   . Uses marijuana   . Chronic anticoagulation   . Functional asplenia   . Former smoker   . Second hand tobacco smoke exposure   . Alcohol consumption of one to four drinks per day   . Noncompliance with medication regimen   . Sickle-cell crisis with associated acute chest syndrome ([place]) 05/13/2013  . Acute chest syndrome ([place]) 06/18/2013  . Demand  ischemia ([place]) 01/02/2014  . Pulmonary hypertension ([place])   . Hb-SS disease with crisis ([place])   . Oxygen deficiency    Past Surgical History  Procedure Laterality Date  . Right hip replacement      08/2006  . Cholecystectomy      01/2008  . Porta cath placement    . Porta cath removal    . Umbilical hernia repair      01/2008  . Excision of left periauricular cyst      10/2009  . Excision of right ear lobe cyst with primary closur      11/2007  . Portacath placement  01/05/2012    Procedure: INSERTION PORT-A-CATH;  Surgeon: Odis Hollingshead, MD;  Location: [place];  Service: General;  Laterality: N/A;  ultrasound guiced port a cath insertion with fluoroscopy   Family History  Problem Relation Age of Onset  . Sickle cell trait Mother   . Depression Mother   . Diabetes Mother   . Sickle cell trait Father   . Sickle cell trait Brother    Social History   Social History  . Marital Status: Single    Spouse Name: N/A  . Number of Children: 0  . Years of Education: 13   Occupational History  . 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 Smoking status: Former Smoker -- 13 years  . Smokeless tobacco: Never Used  . Alcohol Use: No  . Drug Use: No     Comment: Marijuana weekly  . Sexual Activity:    Partners: Female     Comment: month ago   Other Topics Concern  . Not on file   Social History Narrative   Lives in an apartment.  Single.  Lives alone but has a girlfriend that helps care for him.  Does not use any assist devices.        [name]  [phone] Mom, emergency contact      [place] [facility]:   Patient continuing to live in her apartment in town alone. 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    Review of Systems: Eyes: positive for icterus Ears, nose, mouth, throat, and face: negative Respiratory: positive for dyspnea on exertion, negative for emphysema, sputum and wheezing Cardiovascular:  negative Gastrointestinal: negative for abdominal pain, constipation and dysphagia Genitourinary:positive for dysuria and frequency Integument/breast: negative Hematologic/lymphatic: negative Musculoskeletal:positive for bone pain and myalgias Neurological: negative Behavioral/Psych: negative Endocrine: negative  Physical Exam: Blood pressure 110/66, pulse 84, temperature 98.7 F (37.1 C), temperature source Oral, resp. rate 18, height 6' (1.829 m), weight 172 lb (78.019 kg), SpO2 95 %.   General Appearance:    Alert, cooperative, mild distress, appears stated age  Head:    Normocephalic, without obvious abnormality, atraumatic  Eyes:  PERRL, conjunctiva/corneas clear, EOM's intact, fundi    benign, scleral icterus      Ears:    Normal TM's and external ear canals, both ears  Nose:   Nares normal, septum midline, mucosa normal, no drainage    or sinus tenderness  Throat:   Lips, mucosa, and tongue normal; teeth and gums normal  Neck:   Supple, symmetrical, trachea midline, no adenopathy;       thyroid:  No enlargement/tenderness/nodules; no carotid   bruit or JVD  Back:     Symmetric, no curvature, ROM normal, no CVA tenderness  Lungs:     Clear to auscultation bilaterally, respirations unlabored  Chest wall:    No tenderness or deformity  Heart:    Regular rate and rhythm, S1 and S2 normal, Murmur, rub   or gallop  Extremities:   Extremities normal, atraumatic, no cyanosis or edema  Pulses:   2+ and symmetric all extremities  Skin:   Skin color, texture, turgor normal, no rashes or lesions  Lymph nodes:   Cervical, supraclavicular, and axillary nodes normal  Neurologic:   CNII-XII intact. Normal strength, sensation and reflexes      throughout    Lab results: No results found. However, due to the size of the patient record, not all encounters were searched. Please check Results Review for a complete set of results.  Imaging results:  No results found. Assessment &  Plan:  Patient will be admitted to the day infusion center for extended observation  Start IV D5.45 for cellular rehydration at 150/hr  Start Toradol 30 mg IV times one for inflammation.  Start high concentration dilaudid PCA  Patient will be re-evaluated for pain intensity in the context of function and relationship to                  baseline as care progresses.  If no significant pain relief, will transfer patient to inpatient services for a higher level of care.   Start oxygen via n/c at 2 liters per home dose  CBC w/differential and CMP. Previous hemoglobin was 6.9 and platelet count mildly elevated at      571 on 05/21/2015.   Hollis,Lachina M 05/27/2015, 9:19 AM

## 2015-05-27 NOTE — Discharge Instructions (Signed)
Sickle Cell Anemia, Adult Sickle cell anemia is a condition in which red blood cells have an abnormal "sickle" shape. This abnormal shape shortens the cells' life span, which results in a lower than normal concentration of red blood cells in the blood. The sickle shape also causes the cells to clump together and block free blood flow through the blood vessels. As a result, the tissues and organs of the body do not receive enough oxygen. Sickle cell anemia causes organ damage and pain and increases the risk of infection. CAUSES  Sickle cell anemia is a genetic disorder. Those who receive two copies of the gene have the condition, and those who receive one copy have the trait. RISK FACTORS The sickle cell gene is most common in people whose families originated in [place]. Other areas of the globe where sickle cell trait occurs include the Mediterranean, [place] and [place], the [place], and the [place].  SIGNS AND SYMPTOMS  Pain, especially in the extremities, back, chest, or abdomen (common). The pain may start suddenly or may develop following an illness, especially if there is dehydration. Pain can also occur due to overexertion or exposure to extreme temperature changes.  Frequent severe bacterial infections, especially certain types of pneumonia and meningitis.  Pain and swelling in the hands and feet.  Decreased activity.   Loss of appetite.   Change in behavior.  Headaches.  Seizures.  Shortness of breath or difficulty breathing.  Vision changes.  Skin ulcers. Those with the trait may not have symptoms or they may have mild symptoms.  DIAGNOSIS  Sickle cell anemia is diagnosed with blood tests that demonstrate the genetic trait. It is often diagnosed during the newborn period, due to mandatory testing nationwide. A variety of blood tests, X-rays, CT scans, MRI scans, ultrasounds, and lung function tests may also be done to monitor the condition. TREATMENT  Sickle  cell anemia may be treated with:  Medicines. You may be given pain medicines, antibiotic medicines (to treat and prevent infections) or medicines to increase the production of certain types of hemoglobin.  Fluids.  Oxygen.  Blood transfusions. HOME CARE INSTRUCTIONS   Drink enough fluid to keep your urine clear or pale yellow. Increase your fluid intake in hot weather and during exercise.  Do not smoke. Smoking lowers oxygen levels in the blood.   Only take over-the-counter or prescription medicines for pain, fever, or discomfort as directed by your health care provider.  Take antibiotics as directed by your health care provider. Make sure you finish them it even if you start to feel better.   Take supplements as directed by your health care provider.   Consider wearing a medical alert bracelet. This tells anyone caring for you in an emergency of your condition.   When traveling, keep your medical information, health care provider's names, and the medicines you take with you at all times.   If you develop a fever, do not take medicines to reduce the fever right away. This could cover up a problem that is developing. Notify your health care provider.  Keep all follow-up appointments with your health care provider. Sickle cell anemia requires regular medical care. SEEK MEDICAL CARE IF: You have a fever. SEEK IMMEDIATE MEDICAL CARE IF:   You feel dizzy or faint.   You have new abdominal pain, especially on the left side near the stomach area.   You develop a persistent, often uncomfortable and painful penile erection (priapism). If this is not treated immediately it   will lead to impotence.   You have numbness your arms or legs or you have a hard time moving them.   You have a hard time with speech.   You have a fever or persistent symptoms for more than 2-3 days.   You have a fever and your symptoms suddenly get worse.   You have signs or symptoms of infection.  These include:   Chills.   Abnormal tiredness (lethargy).   Irritability.   Poor eating.   Vomiting.   You develop pain that is not helped with medicine.   You develop shortness of breath.  You have pain in your chest.   You are coughing up pus-like or bloody sputum.   You develop a stiff neck.  Your feet or hands swell or have pain.  Your abdomen appears bloated.  You develop joint pain. MAKE SURE YOU:  Understand these instructions.   This information is not intended to replace advice given to you by your health care provider. Make sure you discuss any questions you have with your health care provider.   Document Released: 04/27/2005 Document Revised: 02/07/2014 Document Reviewed: 08/29/2012 Elsevier Interactive Patient Education 2016 [employer] Inc[employer] Sickle Cell Anemia, Adult Sickle cell anemia is a condition in which red blood cells have an abnormal "sickle" shape. This abnormal shape shortens the cells' life span, which results in a lower than normal concentration of red blood cells in the blood. The sickle shape also causes the cells to clump together and block free blood flow through the blood vessels. As a result, the tissues and organs of the body do not receive enough oxygen. Sickle cell anemia causes organ damage and pain and increases the risk of infection. CAUSES  Sickle cell anemia is a genetic disorder. Those who receive two copies of the gene have the condition, and those who receive one copy have the trait. RISK FACTORS The sickle cell gene is most common in people whose families originated in [place]. Other areas of the globe where sickle cell trait occurs include the Mediterranean, [place] and [place], the [place], and the [place].  SIGNS AND SYMPTOMS  Pain, especially in the extremities, back, chest, or abdomen (common). The pain may start suddenly or may develop following an illness, especially if there is dehydration. Pain can also  occur due to overexertion or exposure to extreme temperature changes.  Frequent severe bacterial infections, especially certain types of pneumonia and meningitis.  Pain and swelling in the hands and feet.  Decreased activity.   Loss of appetite.   Change in behavior.  Headaches.  Seizures.  Shortness of breath or difficulty breathing.  Vision changes.  Skin ulcers. Those with the trait may not have symptoms or they may have mild symptoms.  DIAGNOSIS  Sickle cell anemia is diagnosed with blood tests that demonstrate the genetic trait. It is often diagnosed during the newborn period, due to mandatory testing nationwide. A variety of blood tests, X-rays, CT scans, MRI scans, ultrasounds, and lung function tests may also be done to monitor the condition. TREATMENT  Sickle cell anemia may be treated with:  Medicines. You may be given pain medicines, antibiotic medicines (to treat and prevent infections) or medicines to increase the production of certain types of hemoglobin.  Fluids.  Oxygen.  Blood transfusions. HOME CARE INSTRUCTIONS   Drink enough fluid to keep your urine clear or pale yellow. Increase your fluid intake in hot weather and during exercise.  Do not smoke. Smoking lowers oxygen levels   in the blood.   Only take over-the-counter or prescription medicines for pain, fever, or discomfort as directed by your health care provider.  Take antibiotics as directed by your health care provider. Make sure you finish them it even if you start to feel better.   Take supplements as directed by your health care provider.   Consider wearing a medical alert bracelet. This tells anyone caring for you in an emergency of your condition.   When traveling, keep your medical information, health care provider's names, and the medicines you take with you at all times.   If you develop a fever, do not take medicines to reduce the fever right away. This could cover up a problem  that is developing. Notify your health care provider.  Keep all follow-up appointments with your health care provider. Sickle cell anemia requires regular medical care. SEEK MEDICAL CARE IF: You have a fever. SEEK IMMEDIATE MEDICAL CARE IF:   You feel dizzy or faint.   You have new abdominal pain, especially on the left side near the stomach area.   You develop a persistent, often uncomfortable and painful penile erection (priapism). If this is not treated immediately it will lead to impotence.   You have numbness your arms or legs or you have a hard time moving them.   You have a hard time with speech.   You have a fever or persistent symptoms for more than 2-3 days.   You have a fever and your symptoms suddenly get worse.   You have signs or symptoms of infection. These include:   Chills.   Abnormal tiredness (lethargy).   Irritability.   Poor eating.   Vomiting.   You develop pain that is not helped with medicine.   You develop shortness of breath.  You have pain in your chest.   You are coughing up pus-like or bloody sputum.   You develop a stiff neck.  Your feet or hands swell or have pain.  Your abdomen appears bloated.  You develop joint pain. MAKE SURE YOU:  Understand these instructions.   This information is not intended to replace advice given to you by your health care provider. Make sure you discuss any questions you have with your health care provider.   Document Released: 04/27/2005 Document Revised: 02/07/2014 Document Reviewed: 08/29/2012 Elsevier Interactive Patient Education 2016 Elsevier Inc.  

## 2015-05-27 NOTE — Progress Notes (Signed)
[facility]. Pt stated his pain was in his arms, sides and legs. Pt denies chest pain or shortness of breathe. He came with his oxygen from home and was placed on oxygen here.  Pt's pain was 8/10 on admission and down to 5/10 at discharge. Pt was treated with Dilaudid PCA, Toradol and IV fluids. Pt voiced understanding of discharge instructions and knows about his appointment to respiratory appointment. Pt was alert, oriented and ambulatory at discharge.

## 2015-05-27 NOTE — Telephone Encounter (Signed)
Called complaining of pain "all over". 7/10. Although denies specific chest pain, N/V, diarrhea, abdominal pain or priapism. Took Dilaudid and MS contin at 6 am without relief. Notified L. Hollis NP and she advises for patient to come [facility] and to wear and bring oxygen. Patient understands and agrees.

## 2015-05-27 NOTE — Discharge Summary (Signed)
[facility] Discharge Summary   Patient ID: [name] MRN: [id number] DOB/AGE: 1980-09-14 35 y.o.  Admit date: 05/27/2015 Discharge date: 05/27/2015  Primary Care Physician:  [name], MD  Admission Diagnoses:  Active Problems:   Sickle cell anemia with crisis [facility])  Discharge Medications:    Medication List    TAKE these medications        aspirin 81 MG chewable tablet  Chew 1 tablet (81 mg total) by mouth daily.     cyclobenzaprine 5 MG tablet  Commonly known as:  FLEXERIL  Take 1 tablet (5 mg total) by mouth 3 (three) times daily as needed for muscle spasms.     enoxaparin 120 MG/0.8ML injection  Commonly known as:  LOVENOX  Inject 0.8 mLs (120 mg total) into the skin daily.     folic acid 1 MG tablet  Commonly known as:  FOLVITE  Take 1 tablet (1 mg total) by mouth every morning.     gabapentin 300 MG capsule  Commonly known as:  NEURONTIN  Take 1 capsule (300 mg total) by mouth 3 (three) times daily.     HYDROmorphone 4 MG tablet  Commonly known as:  DILAUDID  Take 1 tablet (4 mg total) by mouth every 4 (four) hours as needed for severe pain.     hydroxyurea 500 MG capsule  Commonly known as:  HYDREA  Take 1 capsule (500 mg total) by mouth daily. May take with food to minimize GI side effects.     lisinopril 10 MG tablet  Commonly known as:  PRINIVIL,ZESTRIL  Take 1 tablet (10 mg total) by mouth daily.     metoprolol succinate 50 MG 24 hr tablet  Commonly known as:  TOPROL-XL  Take 1 tablet (50 mg total) by mouth daily.     morphine 30 MG 12 hr tablet  Commonly known as:  MS CONTIN  Take 1 tablet (30 mg total) by mouth every 12 (twelve) hours.     potassium chloride SA 20 MEQ tablet  Commonly known as:  K-DUR,KLOR-CON  Take 1 tablet (20 mEq total) by mouth every morning.     Vitamin D 2000 units tablet  Take 1 tablet (2,000 Units total) by mouth daily.     zolpidem 10 MG tablet  Commonly known as:  AMBIEN   Take 1 tablet (10 mg total) by mouth at bedtime as needed for sleep.         Consults:  None  Significant Diagnostic Studies:  Dg Chest 2 View  05/07/2015  CLINICAL DATA:  Sickle cell pain crisis, chest pain EXAM: CHEST  2 VIEW COMPARISON:  04/26/2015 FINDINGS: Left subclavian power port catheter tip SVC RA junction. Stable cardiomegaly with vascular congestion and basilar atelectasis/ parenchymal scarring. No superimposed pneumonia, collapse or consolidation. No edema, effusion or pneumothorax. Trachea midline. IMPRESSION: Stable cardiomegaly with vascular congestion and parenchymal scarring. No superimposed acute process. Electronically Signed   By: Jerilynn Mages.  Shick M.D.   On: 05/07/2015 09:05     [facility] Course: [name], was admitted to the day infusion center for extended observation.  Continued oxygen at 2 liters.  Reviewed labs, consistent with baseline.  He was started on high concentration PCA dilaudid. He used a total of 16.1 mg with 34 demands and 23 deliveries. Pain intensity decreased to 4/10.  [name] says that he can manage at home on current medication regimen. Reminded to continue to hydrate consistently. Also, continue  home oxygen.  Patient was reminded to follow up in office as scheduled.  The patient was given clear instructions to go to ER or return to medical center if symptoms do not improve, worsen or new problems develop. The patient verbalized understanding.   Physical Exam at Discharge:  BP 110/62 mmHg  Pulse 74  Temp(Src) 98.7 F (37.1 C) (Oral)  Resp 16  Ht 6' (1.829 m)  Wt 172 lb (78.019 kg)  BMI 23.32 kg/m2  SpO2 96%  General Appearance:    Alert, cooperative, no distress, appears stated age  Head:    Normocephalic, without obvious abnormality, atraumatic  Eyes:    PERRL, conjunctiva/corneas clear, EOM's intact, fundi    benign, both eyes       Lungs:     Clear to auscultation bilaterally, respirations unlabored  Chest  wall:    No tenderness or deformity  Heart:    Regular rate and rhythm, S1 and S2 normal, no murmur, rub   or gallop  Abdomen:     Soft, non-tender, bowel sounds active all four quadrants,    no masses, no organomegaly  Pulses:   2+ and symmetric all extremities  Skin:   Skin color, texture, turgor normal, no rashes or lesions  Lymph nodes:   Cervical, supraclavicular, and axillary nodes normal  Neurologic:   CNII-XII intact. Normal strength, sensation and reflexes      throughout   Disposition at Discharge: 01-Home or Self Care  Discharge Orders: Discharge Instructions    Discharge patient    Complete by:  As directed            Condition at Discharge:   Stable  Time spent on Discharge:  15 minutes  Signed: Hollis,Lachina M 05/27/2015, 1:57 PM

## 2015-05-29 ENCOUNTER — Ambulatory Visit (INDEPENDENT_AMBULATORY_CARE_PROVIDER_SITE_OTHER): Payer: Medicare Other | Admitting: Pulmonary Disease

## 2015-05-29 ENCOUNTER — Telehealth (HOSPITAL_COMMUNITY): Payer: Self-pay | Admitting: Vascular Surgery

## 2015-05-29 ENCOUNTER — Other Ambulatory Visit: Payer: Self-pay | Admitting: Internal Medicine

## 2015-05-29 ENCOUNTER — Encounter: Payer: Self-pay | Admitting: Pulmonary Disease

## 2015-05-29 VITALS — BP 114/56 | HR 79 | Ht 72.0 in | Wt 169.0 lb

## 2015-05-29 DIAGNOSIS — I272 Other secondary pulmonary hypertension: Secondary | ICD-10-CM | POA: Diagnosis not present

## 2015-05-29 DIAGNOSIS — R0600 Dyspnea, unspecified: Secondary | ICD-10-CM | POA: Diagnosis not present

## 2015-05-29 DIAGNOSIS — Z0000 Encounter for general adult medical examination without abnormal findings: Secondary | ICD-10-CM

## 2015-05-29 NOTE — Assessment & Plan Note (Signed)
[name] is here today for evaluation of pulmonary hypertension in the setting of sickle cell anemia. His echocardiogram findings were suggestive of at least moderate pulmonary hypertension with mild RV dilatation. His worrisome symptoms include presyncope with exercise as well as dyspnea to the point where he can only walk about 1 block without stopping.  I explained to him today that the differential diagnosis of pulmonary hypertension is broad but in his particular case I feel that his sickle cell anemia is certainly contributing. Also unclear as to what degree pulmonary emboli have contributed to his pulmonary hypertension over the years. While I do not believe he has another cause for his pulmonary hypertension, we must always consider other causes such as HIV, hypothyroidism, and connective tissue disease among others. He has nocturnal hypoxemia but these never been told that he snores does nor does he have any other symptoms suggestive of  obstructive sleep apnea.  I explained to him today that I'm very concerned about his symptoms and the possibility of pulmonary hypertension. In the setting of sickle cell anemia this can be quite a difficult challenge to treat as they're very little clinical trials to suggest inappropriate evidence-based medicine approach.  Plan: Pulmonary hypertension lab panel including TSH, connective tissue disease panel VQ scan to assess for chronic thromboembolic pulmonary hypertension Right heart catheterization Follow-up with me in 4-6 weeks  Greater than 50% of today's visit was spent face-to-face in a 60 minute visit.

## 2015-05-29 NOTE — Telephone Encounter (Signed)
Dawn from DR. Mcquaid office wants to refer pt to get right heart cath

## 2015-05-29 NOTE — Patient Instructions (Signed)
We are going to arrange for a right heart catheterization, and a nuclear lung scan called a VQ scan We will call you with the results of today's blood work We will see you back in 4-6 weeks or sooner if needed

## 2015-05-29 NOTE — Progress Notes (Signed)
Subjective:    Patient ID: [name], male    DOB: 02-09-1980, 35 y.o.   MRN: [id number]  HPI Chief Complaint  Patient presents with  . Advice Only    Referred for pulmonary htn.     [name] is here to see me for pulmonary hypertension.  He says he has been aware of this for about 2 years.  He says that he has to stop walking after one block due to dyspnea.  This will be associated with dizziness and fatigue.  No syncope ever. No chest pain.  No cough.  He says that the dyspnea has been present for two years.  The dyspnea has been about the same for the last two years.  No environmental changes.    He has sickle cell anemia and he has been admitted about on a monthly basis, this has been more frequent in the last two years.  He says that he has had a blood transfusion but never exchange transfusion.    As a child he had no breathing difficulty.    A pulmonary embolism was diagnosed in 11/2014 and he had a pulmonary embolism.  He has been on lovenox since then.  He used to be on warfarin, then he took Xarelto, then it was changed to lovenox.    11/30/2014 CT angiogram chest consistent with small subsegmental pulmonary embolism. Images personally reviewed showing remarkably normal pulmonary parenchyma in the upper lobes, mild interlobular septal thickening in the bases, atelectasis left base.  March 2017 echocardiogram showed a normal LVEF, RV was mildly enlarged with an RVSP in the 70s.  Past Medical History  Diagnosis Date  . Sickle cell anemia (HCC)   . Blood transfusion   . Acute embolism and thrombosis of right internal jugular vein ([place])   . Hypokalemia   . Mood disorder ([place])   . History of pulmonary embolus (PE)   . Avascular necrosis (HCC)     Right Hip  . Leukocytosis     Chronic  . Thrombocytosis (HCC)     Chronic  . Hypertension   . History of Clostridium difficile infection   . Uses marijuana   . Chronic anticoagulation   . Functional asplenia   .  Former smoker   . Second hand tobacco smoke exposure   . Alcohol consumption of one to four drinks per day   . Noncompliance with medication regimen   . Sickle-cell crisis with associated acute chest syndrome ([place]) 05/13/2013  . Acute chest syndrome ([place]) 06/18/2013  . Demand ischemia ([place]) 01/02/2014  . Pulmonary hypertension ([place])   . Hb-SS disease with crisis ([place])   . Oxygen deficiency      Family History  Problem Relation Age of Onset  . Sickle cell trait Mother   . Depression Mother   . Diabetes Mother   . Sickle cell trait Father   . Sickle cell trait Brother      Social History   Social History  . Marital Status: Single    Spouse Name: N/A  . Number of Children: 0  . Years of Education: 13   Occupational History  . 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 Smoking status: Former Smoker -- 0.50 packs/day for 10 years    Types: Cigarettes    Quit date: 05/29/2011  . Smokeless tobacco: Never Used  . Alcohol Use: No  . Drug Use: No  Comment: Marijuana weekly  . Sexual Activity:    Partners: Female     Comment: month ago   Other Topics Concern  . Not on file   Social History Narrative   Lives in an apartment.  Single.  Lives alone but has a girlfriend that helps care for him.  Does not use any assist devices.        Gerald [id number]  [phone] Mom, emergency contact      [facility]:   Patient continuing to live in her apartment in town alone. 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     No Known Allergies   Outpatient Prescriptions Prior to Visit  Medication Sig Dispense Refill  . aspirin 81 MG chewable tablet Chew 1 tablet (81 mg total) by mouth daily. 30 tablet 11  . Cholecalciferol (VITAMIN D) 2000 units tablet Take 1 tablet (2,000 Units total) by mouth daily. 90 tablet 3  . cyclobenzaprine (FLEXERIL) 5 MG tablet Take 1 tablet (5 mg total) by mouth 3 (three) times daily as needed for muscle  spasms. 20 tablet 0  . enoxaparin (LOVENOX) 120 MG/0.8ML injection Inject 0.8 mLs (120 mg total) into the skin daily. 30 Syringe 2  . folic acid (FOLVITE) 1 MG tablet Take 1 tablet (1 mg total) by mouth every morning. 30 tablet 11  . gabapentin (NEURONTIN) 300 MG capsule Take 1 capsule (300 mg total) by mouth 3 (three) times daily. 30 capsule 0  . HYDROmorphone (DILAUDID) 4 MG tablet Take 1 tablet (4 mg total) by mouth every 4 (four) hours as needed for severe pain. 90 tablet 0  . hydroxyurea (HYDREA) 500 MG capsule Take 1 capsule (500 mg total) by mouth daily. May take with food to minimize GI side effects. (Patient taking differently: Take 1,000 mg by mouth daily. May take with food to minimize GI side effects.) 30 capsule 0  . lisinopril (PRINIVIL,ZESTRIL) 10 MG tablet Take 1 tablet (10 mg total) by mouth daily. 30 tablet 5  . metoprolol succinate (TOPROL-XL) 50 MG 24 hr tablet Take 1 tablet (50 mg total) by mouth daily. 30 tablet 3  . morphine (MS CONTIN) 30 MG 12 hr tablet Take 1 tablet (30 mg total) by mouth every 12 (twelve) hours. 60 tablet 0  . potassium chloride SA (K-DUR,KLOR-CON) 20 MEQ tablet Take 1 tablet (20 mEq total) by mouth every morning. 30 tablet 3  . zolpidem (AMBIEN) 10 MG tablet Take 1 tablet (10 mg total) by mouth at bedtime as needed for sleep. 30 tablet 0   No facility-administered medications prior to visit.       Review of Systems  Constitutional: Negative for fever and unexpected weight change.  HENT: Negative for congestion, dental problem, ear pain, nosebleeds, postnasal drip, rhinorrhea, sinus pressure, sneezing, sore throat and trouble swallowing.   Eyes: Negative for redness and itching.  Respiratory: Negative for cough, chest tightness, shortness of breath and wheezing.   Cardiovascular: Negative for palpitations and leg swelling.  Gastrointestinal: Negative for nausea and vomiting.  Genitourinary: Negative for dysuria.  Musculoskeletal: Negative for  joint swelling.  Skin: Negative for rash.  Neurological: Negative for headaches.  Hematological: Does not bruise/bleed easily.  Psychiatric/Behavioral: Negative for dysphoric mood. The patient is not nervous/anxious.        Objective:   Physical Exam Filed Vitals:   05/29/15 0914  BP: 114/56  Pulse: 79  Height: 6' (1.829 m)  Weight: 169 lb (76.658 kg)  SpO2: 93%   RA  Gen: well appearing, no acute distress HENT: NCAT, OP clear, neck supple without masses Eyes: PERRL, EOMi Lymph: no cervical lymphadenopathy PULM: CTA B CV: RRR, loud P2, holosystolic murmur left base, no JVD GI: BS+, soft, nontender, no hsm Derm: no rash or skin breakdown MSK: normal bulk and tone Neuro: A&Ox4, CN II-XII intact, strength 5/5 in all 4 extremities Psyche: normal mood and affect        Assessment & Plan:  PAH (pulmonary artery hypertension) [facility]) Mr. Gala Romney is here today for evaluation of pulmonary hypertension in the setting of sickle cell anemia. His echocardiogram findings were suggestive of at least moderate pulmonary hypertension with mild RV dilatation. His worrisome symptoms include presyncope with exercise as well as dyspnea to the point where he can only walk about 1 block without stopping.  I explained to him today that the differential diagnosis of pulmonary hypertension is broad but in his particular case I feel that his sickle cell anemia is certainly contributing. Also unclear as to what degree pulmonary emboli have contributed to his pulmonary hypertension over the years. While I do not believe he has another cause for his pulmonary hypertension, we must always consider other causes such as HIV, hypothyroidism, and connective tissue disease among others. He has nocturnal hypoxemia but these never been told that he snores does nor does he have any other symptoms suggestive of  obstructive sleep apnea.  I explained to him today that I'm very concerned about his symptoms and the  possibility of pulmonary hypertension. In the setting of sickle cell anemia this can be quite a difficult challenge to treat as they're very little clinical trials to suggest inappropriate evidence-based medicine approach.  Plan: Pulmonary hypertension lab panel including TSH, connective tissue disease panel VQ scan to assess for chronic thromboembolic pulmonary hypertension Right heart catheterization Follow-up with me in 4-6 weeks  Greater than 50% of today's visit was spent face-to-face in a 60 minute visit.     Current outpatient prescriptions:  .  aspirin 81 MG chewable tablet, Chew 1 tablet (81 mg total) by mouth daily., Disp: 30 tablet, Rfl: 11 .  Cholecalciferol (VITAMIN D) 2000 units tablet, Take 1 tablet (2,000 Units total) by mouth daily., Disp: 90 tablet, Rfl: 3 .  cyclobenzaprine (FLEXERIL) 5 MG tablet, Take 1 tablet (5 mg total) by mouth 3 (three) times daily as needed for muscle spasms., Disp: 20 tablet, Rfl: 0 .  enoxaparin (LOVENOX) 120 MG/0.8ML injection, Inject 0.8 mLs (120 mg total) into the skin daily., Disp: 30 Syringe, Rfl: 2 .  folic acid (FOLVITE) 1 MG tablet, Take 1 tablet (1 mg total) by mouth every morning., Disp: 30 tablet, Rfl: 11 .  gabapentin (NEURONTIN) 300 MG capsule, Take 1 capsule (300 mg total) by mouth 3 (three) times daily., Disp: 30 capsule, Rfl: 0 .  HYDROmorphone (DILAUDID) 4 MG tablet, Take 1 tablet (4 mg total) by mouth every 4 (four) hours as needed for severe pain., Disp: 90 tablet, Rfl: 0 .  hydroxyurea (HYDREA) 500 MG capsule, Take 1 capsule (500 mg total) by mouth daily. May take with food to minimize GI side effects. (Patient taking differently: Take 1,000 mg by mouth daily. May take with food to minimize GI side effects.), Disp: 30 capsule, Rfl: 0 .  lisinopril (PRINIVIL,ZESTRIL) 10 MG tablet, Take 1 tablet (10 mg total) by mouth daily., Disp: 30 tablet, Rfl: 5 .  metoprolol succinate (TOPROL-XL) 50 MG 24 hr tablet, Take 1 tablet (50 mg total)  by mouth  daily., Disp: 30 tablet, Rfl: 3 .  morphine (MS CONTIN) 30 MG 12 hr tablet, Take 1 tablet (30 mg total) by mouth every 12 (twelve) hours., Disp: 60 tablet, Rfl: 0 .  potassium chloride SA (K-DUR,KLOR-CON) 20 MEQ tablet, Take 1 tablet (20 mEq total) by mouth every morning., Disp: 30 tablet, Rfl: 3 .  zolpidem (AMBIEN) 10 MG tablet, Take 1 tablet (10 mg total) by mouth at bedtime as needed for sleep., Disp: 30 tablet, Rfl: 0

## 2015-05-31 ENCOUNTER — Emergency Department (HOSPITAL_COMMUNITY)
Admission: EM | Admit: 2015-05-31 | Discharge: 2015-05-31 | Disposition: A | Discharge status: Home / Self Care | Payer: Medicare Other | Source: Home / Self Care | Attending: Emergency Medicine | Admitting: Emergency Medicine

## 2015-05-31 ENCOUNTER — Encounter (HOSPITAL_COMMUNITY): Payer: Self-pay | Admitting: Emergency Medicine

## 2015-05-31 DIAGNOSIS — I10 Essential (primary) hypertension: Secondary | ICD-10-CM | POA: Insufficient documentation

## 2015-05-31 DIAGNOSIS — Z9119 Patient's noncompliance with other medical treatment and regimen: Secondary | ICD-10-CM

## 2015-05-31 DIAGNOSIS — E876 Hypokalemia: Secondary | ICD-10-CM | POA: Insufficient documentation

## 2015-05-31 DIAGNOSIS — M879 Osteonecrosis, unspecified: Secondary | ICD-10-CM | POA: Diagnosis present

## 2015-05-31 DIAGNOSIS — Z7982 Long term (current) use of aspirin: Secondary | ICD-10-CM | POA: Insufficient documentation

## 2015-05-31 DIAGNOSIS — I2699 Other pulmonary embolism without acute cor pulmonale: Secondary | ICD-10-CM | POA: Diagnosis present

## 2015-05-31 DIAGNOSIS — D5700 Hb-SS disease with crisis, unspecified: Secondary | ICD-10-CM

## 2015-05-31 DIAGNOSIS — R0781 Pleurodynia: Secondary | ICD-10-CM | POA: Diagnosis not present

## 2015-05-31 DIAGNOSIS — Z86711 Personal history of pulmonary embolism: Secondary | ICD-10-CM | POA: Insufficient documentation

## 2015-05-31 DIAGNOSIS — Z87891 Personal history of nicotine dependence: Secondary | ICD-10-CM

## 2015-05-31 DIAGNOSIS — R079 Chest pain, unspecified: Secondary | ICD-10-CM | POA: Diagnosis not present

## 2015-05-31 DIAGNOSIS — Z7901 Long term (current) use of anticoagulants: Secondary | ICD-10-CM | POA: Insufficient documentation

## 2015-05-31 DIAGNOSIS — Z833 Family history of diabetes mellitus: Secondary | ICD-10-CM

## 2015-05-31 DIAGNOSIS — Z8659 Personal history of other mental and behavioral disorders: Secondary | ICD-10-CM | POA: Insufficient documentation

## 2015-05-31 DIAGNOSIS — G894 Chronic pain syndrome: Secondary | ICD-10-CM | POA: Diagnosis present

## 2015-05-31 DIAGNOSIS — R509 Fever, unspecified: Secondary | ICD-10-CM | POA: Diagnosis present

## 2015-05-31 DIAGNOSIS — Z79891 Long term (current) use of opiate analgesic: Secondary | ICD-10-CM

## 2015-05-31 DIAGNOSIS — Z86718 Personal history of other venous thrombosis and embolism: Secondary | ICD-10-CM

## 2015-05-31 DIAGNOSIS — Z96641 Presence of right artificial hip joint: Secondary | ICD-10-CM | POA: Diagnosis present

## 2015-05-31 DIAGNOSIS — Z7722 Contact with and (suspected) exposure to environmental tobacco smoke (acute) (chronic): Secondary | ICD-10-CM | POA: Diagnosis present

## 2015-05-31 DIAGNOSIS — J9621 Acute and chronic respiratory failure with hypoxia: Secondary | ICD-10-CM | POA: Diagnosis not present

## 2015-05-31 DIAGNOSIS — R1032 Left lower quadrant pain: Secondary | ICD-10-CM | POA: Diagnosis present

## 2015-05-31 DIAGNOSIS — Z8619 Personal history of other infectious and parasitic diseases: Secondary | ICD-10-CM

## 2015-05-31 DIAGNOSIS — D599 Acquired hemolytic anemia, unspecified: Secondary | ICD-10-CM | POA: Diagnosis present

## 2015-05-31 DIAGNOSIS — Z8739 Personal history of other diseases of the musculoskeletal system and connective tissue: Secondary | ICD-10-CM | POA: Insufficient documentation

## 2015-05-31 DIAGNOSIS — M549 Dorsalgia, unspecified: Secondary | ICD-10-CM | POA: Diagnosis not present

## 2015-05-31 DIAGNOSIS — Q8901 Asplenia (congenital): Secondary | ICD-10-CM | POA: Diagnosis not present

## 2015-05-31 DIAGNOSIS — R740 Nonspecific elevation of levels of transaminase and lactic acid dehydrogenase [LDH]: Secondary | ICD-10-CM | POA: Diagnosis present

## 2015-05-31 DIAGNOSIS — I272 Other secondary pulmonary hypertension: Secondary | ICD-10-CM | POA: Diagnosis not present

## 2015-05-31 DIAGNOSIS — D72829 Elevated white blood cell count, unspecified: Secondary | ICD-10-CM | POA: Diagnosis present

## 2015-05-31 DIAGNOSIS — Z79899 Other long term (current) drug therapy: Secondary | ICD-10-CM | POA: Insufficient documentation

## 2015-05-31 DIAGNOSIS — Z9981 Dependence on supplemental oxygen: Secondary | ICD-10-CM

## 2015-05-31 DIAGNOSIS — D5701 Hb-SS disease with acute chest syndrome: Secondary | ICD-10-CM | POA: Diagnosis not present

## 2015-05-31 DIAGNOSIS — Z9114 Patient's other noncompliance with medication regimen: Secondary | ICD-10-CM

## 2015-05-31 DIAGNOSIS — D638 Anemia in other chronic diseases classified elsewhere: Secondary | ICD-10-CM | POA: Diagnosis present

## 2015-05-31 DIAGNOSIS — F39 Unspecified mood [affective] disorder: Secondary | ICD-10-CM | POA: Diagnosis present

## 2015-05-31 DIAGNOSIS — Z818 Family history of other mental and behavioral disorders: Secondary | ICD-10-CM

## 2015-05-31 LAB — CBC WITH DIFFERENTIAL/PLATELET
BASOS ABS: 0 10*3/uL (ref 0.0–0.1)
Basophils Relative: 0 %
EOS PCT: 4 %
Eosinophils Absolute: 0.7 10*3/uL (ref 0.0–0.7)
HEMATOCRIT: 19.2 % — AB (ref 39.0–52.0)
Hemoglobin: 6.6 g/dL — CL (ref 13.0–17.0)
LYMPHS ABS: 2.2 10*3/uL (ref 0.7–4.0)
Lymphocytes Relative: 13 %
MCH: 30.4 pg (ref 26.0–34.0)
MCHC: 34.4 g/dL (ref 30.0–36.0)
MCV: 88.5 fL (ref 78.0–100.0)
MONO ABS: 1.9 10*3/uL — AB (ref 0.1–1.0)
Monocytes Relative: 11 %
NEUTROS PCT: 72 %
Neutro Abs: 12.1 10*3/uL — ABNORMAL HIGH (ref 1.7–7.7)
PLATELETS: 528 10*3/uL — AB (ref 150–400)
RBC: 2.17 MIL/uL — AB (ref 4.22–5.81)
RDW: 24.2 % — AB (ref 11.5–15.5)
WBC Morphology: INCREASED
WBC: 16.9 10*3/uL — AB (ref 4.0–10.5)

## 2015-05-31 LAB — COMPREHENSIVE METABOLIC PANEL
ALT: 22 U/L (ref 17–63)
ANION GAP: 9 (ref 5–15)
AST: 36 U/L (ref 15–41)
Albumin: 4.3 g/dL (ref 3.5–5.0)
Alkaline Phosphatase: 72 U/L (ref 38–126)
BILIRUBIN TOTAL: 4.6 mg/dL — AB (ref 0.3–1.2)
BUN: 15 mg/dL (ref 6–20)
CHLORIDE: 110 mmol/L (ref 101–111)
CO2: 19 mmol/L — ABNORMAL LOW (ref 22–32)
Calcium: 9.2 mg/dL (ref 8.9–10.3)
Creatinine, Ser: 0.75 mg/dL (ref 0.61–1.24)
Glucose, Bld: 108 mg/dL — ABNORMAL HIGH (ref 65–99)
POTASSIUM: 4.3 mmol/L (ref 3.5–5.1)
Sodium: 138 mmol/L (ref 135–145)
TOTAL PROTEIN: 7.6 g/dL (ref 6.5–8.1)

## 2015-05-31 LAB — RETICULOCYTES
RBC.: 2.17 MIL/uL — AB (ref 4.22–5.81)
RETIC COUNT ABSOLUTE: 243 10*3/uL — AB (ref 19.0–186.0)
RETIC CT PCT: 11.2 % — AB (ref 0.4–3.1)

## 2015-05-31 MED ORDER — DIPHENHYDRAMINE HCL 50 MG/ML IJ SOLN
25.0000 mg | Freq: Once | INTRAMUSCULAR | Status: AC
  Administered 2015-05-31: 25 mg via INTRAVENOUS
  Filled 2015-05-31: qty 1

## 2015-05-31 MED ORDER — HYDROMORPHONE HCL 2 MG/ML IJ SOLN
3.0000 mg | INTRAMUSCULAR | Status: AC
  Administered 2015-05-31 (×3): 3 mg via INTRAVENOUS
  Filled 2015-05-31 (×3): qty 2

## 2015-05-31 MED ORDER — DEXTROSE-NACL 5-0.45 % IV SOLN
INTRAVENOUS | Status: DC
  Administered 2015-05-31: 1000 mL via INTRAVENOUS

## 2015-05-31 MED ORDER — HEPARIN SOD (PORK) LOCK FLUSH 100 UNIT/ML IV SOLN
500.0000 [IU] | Freq: Once | INTRAVENOUS | Status: AC
  Administered 2015-05-31: 500 [IU]
  Filled 2015-05-31: qty 5

## 2015-05-31 MED ORDER — KETOROLAC TROMETHAMINE 30 MG/ML IJ SOLN
30.0000 mg | Freq: Once | INTRAMUSCULAR | Status: AC
  Administered 2015-05-31: 30 mg via INTRAVENOUS
  Filled 2015-05-31: qty 1

## 2015-05-31 NOTE — ED Notes (Signed)
Pt reports sickle cell crisis pain in ribs and back for past 2 days. Location typical for crisis pain.

## 2015-05-31 NOTE — ED Provider Notes (Signed)
CSN: [id number]     Arrival date & time 05/31/15  0718 History   First MD Initiated Contact with Patient 05/31/15 0725     Chief Complaint  Patient presents with  . Sickle Cell Pain Crisis     (Consider location/radiation/quality/duration/timing/severity/associated sxs/prior Treatment) HPI Patient reports he's having pain on both sides of his chest. He indicates his lower flank and upper abdomen. It is symmetric in nature. Constant aching. Patient denies fever, cough, shortness of breath or chills. He reports he had a small amount of vomiting about 3 days ago. None since. He reports occasional episodes of loose stool. No lower extremity swelling or pain. Past Medical History  Diagnosis Date  . Sickle cell anemia (HCC)   . Blood transfusion   . Acute embolism and thrombosis of right internal jugular vein ([place])   . Hypokalemia   . Mood disorder ([place])   . History of pulmonary embolus (PE)   . Avascular necrosis (HCC)     Right Hip  . Leukocytosis     Chronic  . Thrombocytosis (HCC)     Chronic  . Hypertension   . History of Clostridium difficile infection   . Uses marijuana   . Chronic anticoagulation   . Functional asplenia   . Former smoker   . Second hand tobacco smoke exposure   . Alcohol consumption of one to four drinks per day   . Noncompliance with medication regimen   . Sickle-cell crisis with associated acute chest syndrome ([place]) 05/13/2013  . Acute chest syndrome ([place]) 06/18/2013  . Demand ischemia ([place]) 01/02/2014  . Pulmonary hypertension ([place])   . Hb-SS disease with crisis ([place])   . Oxygen deficiency    Past Surgical History  Procedure Laterality Date  . Right hip replacement      08/2006  . Cholecystectomy      01/2008  . Porta cath placement    . Porta cath removal    . Umbilical hernia repair      01/2008  . Excision of left periauricular cyst      10/2009  . Excision of right ear lobe cyst with primary closur      11/2007  . Portacath placement   01/05/2012    Procedure: INSERTION PORT-A-CATH;  Surgeon: Odis Hollingshead, MD;  Location: [facility];  Service: General;  Laterality: N/A;  ultrasound guiced port a cath insertion with fluoroscopy   Family History  Problem Relation Age of Onset  . Sickle cell trait Mother   . Depression Mother   . Diabetes Mother   . Sickle cell trait Father   . Sickle cell trait Brother    Social History  Substance Use Topics  . Smoking status: Former Smoker -- 0.50 packs/day for 10 years    Types: Cigarettes    Quit date: 05/29/2011  . Smokeless tobacco: Never Used  . Alcohol Use: No    Review of Systems 10 Systems reviewed and are negative for acute change except as noted in the HPI.   Allergies  Review of patient's allergies indicates no known allergies.  Home Medications   Prior to Admission medications   Medication Sig Start Date End Date Taking? Authorizing Provider  aspirin 81 MG chewable tablet Chew 1 tablet (81 mg total) by mouth daily. 07/23/14  Yes [name], MD  Cholecalciferol (VITAMIN D) 2000 units tablet Take 1 tablet (2,000 Units total) by mouth daily. 02/23/15  Yes Dorena Dew, FNP  cyclobenzaprine (FLEXERIL) 5 MG tablet  Take 1 tablet (5 mg total) by mouth 3 (three) times daily as needed for muscle spasms. 02/19/15  Yes Alfonzo Beers, MD  enoxaparin (LOVENOX) 120 MG/0.8ML injection Inject 0.8 mLs (120 mg total) into the skin daily. 12/03/14  Yes [name], MD  folic acid (FOLVITE) 1 MG tablet Take 1 tablet (1 mg total) by mouth every morning. 02/23/15  Yes Dorena Dew, FNP  gabapentin (NEURONTIN) 300 MG capsule Take 1 capsule (300 mg total) by mouth 3 (three) times daily. 05/04/15  Yes Dorena Dew, FNP  HYDROmorphone (DILAUDID) 4 MG tablet Take 1 tablet (4 mg total) by mouth every 4 (four) hours as needed for severe pain. 05/05/15  Yes Dorena Dew, FNP  hydroxyurea (HYDREA) 500 MG capsule Take 1 capsule (500 mg total) by mouth daily. May take with  food to minimize GI side effects. Patient taking differently: Take 1,000 mg by mouth daily. May take with food to minimize GI side effects. 11/06/14  Yes Dorena Dew, FNP  lisinopril (PRINIVIL,ZESTRIL) 10 MG tablet Take 1 tablet (10 mg total) by mouth daily. 04/13/15  Yes Dorena Dew, FNP  metoprolol succinate (TOPROL-XL) 50 MG 24 hr tablet Take 1 tablet (50 mg total) by mouth daily. 02/23/15  Yes Dorena Dew, FNP  morphine (MS CONTIN) 30 MG 12 hr tablet Take 1 tablet (30 mg total) by mouth every 12 (twelve) hours. 05/05/15  Yes Dorena Dew, FNP  potassium chloride SA (K-DUR,KLOR-CON) 20 MEQ tablet Take 1 tablet (20 mEq total) by mouth every morning. 04/13/15  Yes Dorena Dew, FNP  zolpidem (AMBIEN) 10 MG tablet Take 1 tablet (10 mg total) by mouth at bedtime as needed for sleep. 05/05/15  Yes Dorena Dew, FNP   BP 100/69 mmHg  Pulse 55  Temp(Src) 98.4 F (36.9 C) (Oral)  Resp 13  SpO2 99% Physical Exam  Constitutional: He is oriented to person, place, and time. He appears well-developed and well-nourished.  HENT:  Head: Normocephalic and atraumatic.  Eyes: EOM are normal. Pupils are equal, round, and reactive to light.  Neck: Neck supple.  Cardiovascular: Normal rate, regular rhythm, normal heart sounds and intact distal pulses.   Pulmonary/Chest: Effort normal and breath sounds normal.  Abdominal: Soft. Bowel sounds are normal. He exhibits no distension. There is no tenderness.  Musculoskeletal: Normal range of motion. He exhibits no edema or tenderness.  Neurological: He is alert and oriented to person, place, and time. He has normal strength. Coordination normal. GCS eye subscore is 4. GCS verbal subscore is 5. GCS motor subscore is 6.  Skin: Skin is warm, dry and intact.  Psychiatric: He has a normal mood and affect.    ED Course  Procedures (including critical care time) Labs Review Labs Reviewed  CBC WITH DIFFERENTIAL/PLATELET - Abnormal; Notable for the  following:    WBC 16.9 (*)    RBC 2.17 (*)    Hemoglobin 6.6 (*)    HCT 19.2 (*)    RDW 24.2 (*)    Platelets 528 (*)    Neutro Abs 12.1 (*)    Monocytes Absolute 1.9 (*)    All other components within normal limits  RETICULOCYTES - Abnormal; Notable for the following:    Retic Ct Pct 11.2 (*)    RBC. 2.17 (*)    Retic Count, Manual 243.0 (*)    All other components within normal limits  COMPREHENSIVE METABOLIC PANEL - Abnormal; Notable for the following:  CO2 19 (*)    Glucose, Bld 108 (*)    Total Bilirubin 4.6 (*)    All other components within normal limits    Imaging Review No results found. I have personally reviewed and evaluated these images and lab results as part of my medical decision-making.   EKG Interpretation None      MDM   Final diagnoses:  Sickle cell crisis ([place])   Upon recheck, after 3 doses of Dilaudid, Toradol, Benadryl and hydration, patient reports he feels improved and would like to go home rather than be admitted to the hospital today. Patient is counseled to call the sickle cell clinic first thing in the morning for recheck. Signs and symptoms which to return are provided.    Charlesetta Shanks, MD 05/31/15 [phone]

## 2015-05-31 NOTE — ED Notes (Signed)
Awake. Verbally responsive. A/O x4. Resp even and unlabored. No audible adventitious breath sounds noted. ABC's intact.  

## 2015-05-31 NOTE — Discharge Instructions (Signed)
Sickle Cell Anemia, Adult Sickle cell anemia is a condition in which red blood cells have an abnormal "sickle" shape. This abnormal shape shortens the cells' life span, which results in a lower than normal concentration of red blood cells in the blood. The sickle shape also causes the cells to clump together and block free blood flow through the blood vessels. As a result, the tissues and organs of the body do not receive enough oxygen. Sickle cell anemia causes organ damage and pain and increases the risk of infection. CAUSES  Sickle cell anemia is a genetic disorder. Those who receive two copies of the gene have the condition, and those who receive one copy have the trait. RISK FACTORS The sickle cell gene is most common in people whose families originated in [place]. Other areas of the globe where sickle cell trait occurs include the [place], [place] and [place], the [place], and the [place].  SIGNS AND SYMPTOMS  Pain, especially in the extremities, back, chest, or abdomen (common). The pain may start suddenly or may develop following an illness, especially if there is dehydration. Pain can also occur due to overexertion or exposure to extreme temperature changes.  Frequent severe bacterial infections, especially certain types of pneumonia and meningitis.  Pain and swelling in the hands and feet.  Decreased activity.   Loss of appetite.   Change in behavior.  Headaches.  Seizures.  Shortness of breath or difficulty breathing.  Vision changes.  Skin ulcers. Those with the trait may not have symptoms or they may have mild symptoms.  DIAGNOSIS  Sickle cell anemia is diagnosed with blood tests that demonstrate the genetic trait. It is often diagnosed during the newborn period, due to mandatory testing nationwide. A variety of blood tests, X-rays, CT scans, MRI scans, ultrasounds, and lung function tests may also be done to monitor the condition. TREATMENT  Sickle  cell anemia may be treated with:  Medicines. You may be given pain medicines, antibiotic medicines (to treat and prevent infections) or medicines to increase the production of certain types of hemoglobin.  Fluids.  Oxygen.  Blood transfusions. HOME CARE INSTRUCTIONS   Drink enough fluid to keep your urine clear or pale yellow. Increase your fluid intake in hot weather and during exercise.  Do not smoke. Smoking lowers oxygen levels in the blood.   Only take over-the-counter or prescription medicines for pain, fever, or discomfort as directed by your health care provider.  Take antibiotics as directed by your health care provider. Make sure you finish them it even if you start to feel better.   Take supplements as directed by your health care provider.   Consider wearing a medical alert bracelet. This tells anyone caring for you in an emergency of your condition.   When traveling, keep your medical information, health care provider's names, and the medicines you take with you at all times.   If you develop a fever, do not take medicines to reduce the fever right away. This could cover up a problem that is developing. Notify your health care provider.  Keep all follow-up appointments with your health care provider. Sickle cell anemia requires regular medical care. SEEK MEDICAL CARE IF: You have a fever. SEEK IMMEDIATE MEDICAL CARE IF:   You feel dizzy or faint.   You have new abdominal pain, especially on the left side near the stomach area.   You develop a persistent, often uncomfortable and painful penile erection (priapism). If this is not treated immediately it   will lead to impotence.   You have numbness your arms or legs or you have a hard time moving them.   You have a hard time with speech.   You have a fever or persistent symptoms for more than 2-3 days.   You have a fever and your symptoms suddenly get worse.   You have signs or symptoms of infection.  These include:   Chills.   Abnormal tiredness (lethargy).   Irritability.   Poor eating.   Vomiting.   You develop pain that is not helped with medicine.   You develop shortness of breath.  You have pain in your chest.   You are coughing up pus-like or bloody sputum.   You develop a stiff neck.  Your feet or hands swell or have pain.  Your abdomen appears bloated.  You develop joint pain. MAKE SURE YOU:  Understand these instructions.   This information is not intended to replace advice given to you by your health care provider. Make sure you discuss any questions you have with your health care provider.   Document Released: 04/27/2005 Document Revised: 02/07/2014 Document Reviewed: 08/29/2012 Elsevier Interactive Patient Education 2016 [employer]  

## 2015-06-01 ENCOUNTER — Telehealth (HOSPITAL_COMMUNITY): Payer: Self-pay | Admitting: *Deleted

## 2015-06-01 ENCOUNTER — Non-Acute Institutional Stay (HOSPITAL_COMMUNITY)
Admission: AD | Admit: 2015-06-01 | Discharge: 2015-06-01 | Disposition: A | Discharge status: Home / Self Care | Payer: Medicare Other | Source: Ambulatory Visit | Attending: Internal Medicine | Admitting: Internal Medicine

## 2015-06-01 ENCOUNTER — Emergency Department (HOSPITAL_COMMUNITY)
Admission: EM | Admit: 2015-06-01 | Discharge: 2015-06-01 | Disposition: A | Discharge status: Other Acute Inpatient Hospital | Payer: Medicare Other | Source: Home / Self Care | Attending: Emergency Medicine | Admitting: Emergency Medicine

## 2015-06-01 ENCOUNTER — Emergency Department (HOSPITAL_COMMUNITY): Payer: Medicare Other

## 2015-06-01 ENCOUNTER — Encounter (HOSPITAL_COMMUNITY): Payer: Self-pay

## 2015-06-01 ENCOUNTER — Other Ambulatory Visit: Payer: Self-pay | Admitting: Internal Medicine

## 2015-06-01 DIAGNOSIS — Z79891 Long term (current) use of opiate analgesic: Secondary | ICD-10-CM

## 2015-06-01 DIAGNOSIS — Z79899 Other long term (current) drug therapy: Secondary | ICD-10-CM

## 2015-06-01 DIAGNOSIS — Z87891 Personal history of nicotine dependence: Secondary | ICD-10-CM | POA: Insufficient documentation

## 2015-06-01 DIAGNOSIS — F39 Unspecified mood [affective] disorder: Secondary | ICD-10-CM

## 2015-06-01 DIAGNOSIS — D473 Essential (hemorrhagic) thrombocythemia: Secondary | ICD-10-CM | POA: Insufficient documentation

## 2015-06-01 DIAGNOSIS — Z7901 Long term (current) use of anticoagulants: Secondary | ICD-10-CM | POA: Insufficient documentation

## 2015-06-01 DIAGNOSIS — D5700 Hb-SS disease with crisis, unspecified: Secondary | ICD-10-CM | POA: Diagnosis not present

## 2015-06-01 DIAGNOSIS — D571 Sickle-cell disease without crisis: Secondary | ICD-10-CM

## 2015-06-01 DIAGNOSIS — I10 Essential (primary) hypertension: Secondary | ICD-10-CM

## 2015-06-01 DIAGNOSIS — R0781 Pleurodynia: Secondary | ICD-10-CM | POA: Diagnosis not present

## 2015-06-01 DIAGNOSIS — Z96641 Presence of right artificial hip joint: Secondary | ICD-10-CM | POA: Insufficient documentation

## 2015-06-01 DIAGNOSIS — Z7982 Long term (current) use of aspirin: Secondary | ICD-10-CM | POA: Insufficient documentation

## 2015-06-01 DIAGNOSIS — G4700 Insomnia, unspecified: Secondary | ICD-10-CM

## 2015-06-01 DIAGNOSIS — G894 Chronic pain syndrome: Secondary | ICD-10-CM

## 2015-06-01 LAB — COMPREHENSIVE METABOLIC PANEL
ALT: 20 U/L (ref 17–63)
ANION GAP: 9 (ref 5–15)
AST: 38 U/L (ref 15–41)
Albumin: 4.5 g/dL (ref 3.5–5.0)
Alkaline Phosphatase: 71 U/L (ref 38–126)
BILIRUBIN TOTAL: 5.5 mg/dL — AB (ref 0.3–1.2)
BUN: 13 mg/dL (ref 6–20)
CHLORIDE: 111 mmol/L (ref 101–111)
CO2: 18 mmol/L — ABNORMAL LOW (ref 22–32)
Calcium: 9 mg/dL (ref 8.9–10.3)
Creatinine, Ser: 0.76 mg/dL (ref 0.61–1.24)
GFR calc Af Amer: >60 mL/min (ref >60)
Glucose, Bld: 99 mg/dL (ref 65–99)
POTASSIUM: 4.4 mmol/L (ref 3.5–5.1)
Sodium: 138 mmol/L (ref 135–145)
Total Protein: 7.8 g/dL (ref 6.5–8.1)

## 2015-06-01 LAB — CBC WITH DIFFERENTIAL/PLATELET
Basophils Absolute: 0 10*3/uL (ref 0.0–0.1)
Basophils Relative: 0 %
EOS ABS: 0.5 10*3/uL (ref 0.0–0.7)
Eosinophils Relative: 3 %
HEMATOCRIT: 18.9 % — AB (ref 39.0–52.0)
Hemoglobin: 6.6 g/dL — CL (ref 13.0–17.0)
LYMPHS ABS: 1.7 10*3/uL (ref 0.7–4.0)
Lymphocytes Relative: 11 %
MCH: 30.7 pg (ref 26.0–34.0)
MCHC: 34.9 g/dL (ref 30.0–36.0)
MCV: 87.9 fL (ref 78.0–100.0)
MONO ABS: 2 10*3/uL — AB (ref 0.1–1.0)
Monocytes Relative: 13 %
NEUTROS ABS: 10.8 10*3/uL — AB (ref 1.7–7.7)
Neutrophils Relative %: 73 %
Platelets: 497 10*3/uL — ABNORMAL HIGH (ref 150–400)
RBC: 2.15 MIL/uL — AB (ref 4.22–5.81)
RDW: 23.9 % — AB (ref 11.5–15.5)
WBC: 15 10*3/uL — AB (ref 4.0–10.5)
nRBC: 2 /100 WBC — ABNORMAL HIGH

## 2015-06-01 MED ORDER — POLYETHYLENE GLYCOL 3350 17 G PO PACK
17.0000 g | PACK | Freq: Every day | ORAL | Status: DC | PRN
  Filled 2015-06-01: qty 1

## 2015-06-01 MED ORDER — HYDROMORPHONE HCL 1 MG/ML IJ SOLN
2.0000 mg | Freq: Once | INTRAMUSCULAR | Status: AC
  Administered 2015-06-01: 2 mg via INTRAVENOUS
  Filled 2015-06-01: qty 2

## 2015-06-01 MED ORDER — SENNOSIDES-DOCUSATE SODIUM 8.6-50 MG PO TABS: 1.0000 | ORAL_TABLET | Freq: Two times a day (BID) | ORAL | Status: DC

## 2015-06-01 MED ORDER — KETOROLAC TROMETHAMINE 30 MG/ML IJ SOLN
30.0000 mg | Freq: Four times a day (QID) | INTRAMUSCULAR | Status: DC
  Administered 2015-06-01: 30 mg via INTRAVENOUS
  Filled 2015-06-01: qty 1

## 2015-06-01 MED ORDER — SODIUM CHLORIDE 0.9% FLUSH
10.0000 mL | INTRAVENOUS | Status: AC | PRN
  Administered 2015-06-01: 10 mL

## 2015-06-01 MED ORDER — DEXTROSE-NACL 5-0.45 % IV SOLN
INTRAVENOUS | Status: DC
  Administered 2015-06-01: 14:00:00 via INTRAVENOUS

## 2015-06-01 MED ORDER — SODIUM CHLORIDE 0.9 % IV SOLN: 25.0000 mg | INTRAVENOUS | Status: DC | PRN

## 2015-06-01 MED ORDER — NALOXONE HCL 0.4 MG/ML IJ SOLN: 0.4000 mg | INTRAMUSCULAR | Status: DC | PRN

## 2015-06-01 MED ORDER — ONDANSETRON HCL 4 MG/2ML IJ SOLN
4.0000 mg | Freq: Once | INTRAMUSCULAR | Status: AC
  Administered 2015-06-01: 4 mg via INTRAVENOUS
  Filled 2015-06-01: qty 2

## 2015-06-01 MED ORDER — DIPHENHYDRAMINE HCL 12.5 MG/5ML PO ELIX: 12.5000 mg | ORAL_SOLUTION | Freq: Four times a day (QID) | ORAL | Status: DC | PRN

## 2015-06-01 MED ORDER — SODIUM CHLORIDE 0.9 % IV BOLUS (SEPSIS)
500.0000 mL | Freq: Once | INTRAVENOUS | Status: AC
  Administered 2015-06-01: 500 mL via INTRAVENOUS

## 2015-06-01 MED ORDER — HYDROMORPHONE HCL 4 MG PO TABS: 4.0000 mg | ORAL_TABLET | ORAL | Status: DC | PRN

## 2015-06-01 MED ORDER — DIPHENHYDRAMINE HCL 50 MG/ML IJ SOLN
25.0000 mg | INTRAMUSCULAR | Status: DC | PRN
  Administered 2015-06-01: 25 mg via INTRAVENOUS
  Filled 2015-06-01: qty 1

## 2015-06-01 MED ORDER — HEPARIN SOD (PORK) LOCK FLUSH 100 UNIT/ML IV SOLN
500.0000 [IU] | INTRAVENOUS | Status: AC | PRN
  Administered 2015-06-01: 500 [IU]
  Filled 2015-06-01: qty 5

## 2015-06-01 MED ORDER — GABAPENTIN 300 MG PO CAPS: 300.0000 mg | ORAL_CAPSULE | Freq: Three times a day (TID) | ORAL | Status: AC

## 2015-06-01 MED ORDER — MORPHINE SULFATE ER 30 MG PO TBCR: 30.0000 mg | EXTENDED_RELEASE_TABLET | Freq: Two times a day (BID) | ORAL | Status: DC

## 2015-06-01 MED ORDER — HYDROMORPHONE 1 MG/ML IV SOLN
INTRAVENOUS | Status: DC
  Administered 2015-06-01: 14:00:00 via INTRAVENOUS
  Administered 2015-06-01: 8 mg via INTRAVENOUS
  Filled 2015-06-01: qty 25

## 2015-06-01 MED ORDER — SODIUM CHLORIDE 0.9% FLUSH: 9.0000 mL | INTRAVENOUS | Status: DC | PRN

## 2015-06-01 MED ORDER — HYDROXYUREA 500 MG PO CAPS: 1000.0000 mg | ORAL_CAPSULE | Freq: Every day | ORAL | Status: DC

## 2015-06-01 MED ORDER — OXYCODONE-ACETAMINOPHEN 5-325 MG PO TABS
1.0000 | ORAL_TABLET | ORAL | Status: DC | PRN
Start: 2015-06-01 — End: 2015-06-01
  Administered 2015-06-01: 1 via ORAL
  Filled 2015-06-01: qty 1

## 2015-06-01 MED ORDER — ONDANSETRON HCL 4 MG/2ML IJ SOLN: 4.0000 mg | Freq: Four times a day (QID) | INTRAMUSCULAR | Status: DC | PRN

## 2015-06-01 MED ORDER — FOLIC ACID 1 MG PO TABS: 1.0000 mg | ORAL_TABLET | Freq: Every day | ORAL | Status: DC

## 2015-06-01 MED ORDER — DIPHENHYDRAMINE HCL 25 MG PO CAPS: 25.0000 mg | ORAL_CAPSULE | ORAL | Status: DC | PRN

## 2015-06-01 MED ORDER — ZOLPIDEM TARTRATE 10 MG PO TABS: 10.0000 mg | ORAL_TABLET | Freq: Every evening | ORAL | Status: DC | PRN

## 2015-06-01 MED ORDER — SODIUM CHLORIDE 0.9 % IV SOLN: 12.5000 mg | Freq: Four times a day (QID) | INTRAVENOUS | Status: DC | PRN

## 2015-06-01 MED ORDER — SODIUM CHLORIDE 0.9 % IV SOLN
25.0000 mg | INTRAVENOUS | Status: DC | PRN
  Filled 2015-06-01: qty 0.5

## 2015-06-01 NOTE — ED Notes (Signed)
Pt c/o bilateral ribcage pain and low back pain r/t Sickle Cell crisis x "a couple days."  Pain score 8/10.  Pt reports taking all medications as prescribed.

## 2015-06-01 NOTE — Discharge Summary (Signed)
Physician Discharge Summary  [name] [id number] DOB: 1980/03/03 DOA: 06/01/2015  PCP: [name], MD  Admit date: 06/01/2015  Discharge date: 06/01/2015  Time spent: 30 minutes  Discharge Diagnoses:  Active Problems:   Sickle cell anemia with pain [facility])  Discharge Condition: Stable  Diet recommendation: Regular  History of present illness:   [name] is a 35 y.o. male with history of sickle cell anemia and multiple complications as listed below presented to the day hospital with complaint of generalized body pain mostly on his ribs bilaterally. No cough. No dizziness. No syncopal attack. Patient was in the ED earlier this morning where he was sent to the [facility] for observation and pain management. In the emergency department, his comprehensive metabolic panel was normal, his hemoglobin is at baseline and no acute findings on evaluation. Patient was also seen in the ED yesterday for the same complaint, he is known to utilize ED, inpatient and day hospital services very frequently. He currently rates his pain at an 8, described as constant, throbbing. He took his Dilaudid this morning at 6:30 AM with no sustained relief. He is out of his long acting pain medication. He denies any headache, no urinary symptoms, no change in bowel habit.  Hospital Course:   [name] was admitted to the day hospital with sickle cell painful crisis. Patient was treated with weight based IV Dilaudid PCA, IV Toradol as well as IV fluids. [name] showed significant improvement symptomatically and pain went down from 8 to 5 out of 10 at the time of discharge. [name] will follow-up in the clinic as previously scheduled, continue with home medications as per prior to admission.  Discharge Exam: Filed Vitals:   06/01/15 1445 06/01/15 1553  BP: 102/72 94/58  Pulse: 63 65  Resp: 16 16    General appearance: alert, cooperative and no distress Eyes: conjunctivae/corneas  clear. PERRL, EOM's intact. Fundi benign. Neck: no adenopathy, no carotid bruit, no JVD, supple, symmetrical, trachea midline and thyroid not enlarged, symmetric, no tenderness/mass/nodules Back: symmetric, no curvature. ROM normal. No CVA tenderness. Resp: clear to auscultation bilaterally Chest wall: no tenderness Cardio: regular rate and rhythm, S1, S2 normal, no murmur, click, rub or gallop GI: soft, non-tender; bowel sounds normal; no masses, no organomegaly Extremities: extremities normal, atraumatic, no cyanosis or edema Pulses: 2+ and symmetric Skin: Skin color, texture, turgor normal. No rashes or lesions Neurologic: Grossly normal  Discharge Instructions We discussed the need for good hydration, monitoring of hydration status, avoidance of heat, cold, stress, and infection triggers. We discussed the need to be compliant with taking Hydrea. [name] was reminded of the need to seek medical attention of any symptoms of bleeding, anemia, or infection occurs.  Current Discharge Medication List    CONTINUE these medications which have NOT CHANGED   Details  aspirin 81 MG chewable tablet Chew 1 tablet (81 mg total) by mouth daily. Qty: 30 tablet, Refills: 11    Cholecalciferol (VITAMIN D) 2000 units tablet Take 1 tablet (2,000 Units total) by mouth daily. Qty: 90 tablet, Refills: 3   Associated Diagnoses: Vitamin D deficiency    cyclobenzaprine (FLEXERIL) 5 MG tablet Take 1 tablet (5 mg total) by mouth 3 (three) times daily as needed for muscle spasms. Qty: 20 tablet, Refills: 0    enoxaparin (LOVENOX) 120 MG/0.8ML injection Inject 0.8 mLs (120 mg total) into the skin daily. Qty: 30 Syringe, Refills: 2    folic acid (FOLVITE) 1 MG tablet Take 1  tablet (1 mg total) by mouth every morning. Qty: 30 tablet, Refills: 11   Associated Diagnoses: Hb-SS disease without crisis ([place])    gabapentin (NEURONTIN) 300 MG capsule Take 1 capsule (300 mg total) by mouth 3 (three) times  daily. Qty: 270 capsule, Refills: 3    HYDROmorphone (DILAUDID) 4 MG tablet Take 1 tablet (4 mg total) by mouth every 4 (four) hours as needed for severe pain. Qty: 90 tablet, Refills: 0   Associated Diagnoses: Sickle-cell disease with pain ([place]); Chronic pain syndrome    hydroxyurea (HYDREA) 500 MG capsule Take 2 capsules (1,000 mg total) by mouth daily. May take with food to minimize GI side effects. Qty: 60 capsule, Refills: 5   Associated Diagnoses: Hb-SS disease without crisis (HCC)    lisinopril (PRINIVIL,ZESTRIL) 10 MG tablet Take 1 tablet (10 mg total) by mouth daily. Qty: 30 tablet, Refills: 5   Associated Diagnoses: Hb-SS disease without crisis ([place]); Essential hypertension    metoprolol succinate (TOPROL-XL) 50 MG 24 hr tablet Take 1 tablet (50 mg total) by mouth daily. Qty: 30 tablet, Refills: 3   Associated Diagnoses: Chronic atrial fibrillation ([place]); Pulmonary HTN (HCC)    morphine (MS CONTIN) 30 MG 12 hr tablet Take 1 tablet (30 mg total) by mouth every 12 (twelve) hours. Qty: 60 tablet, Refills: 0   Associated Diagnoses: Chronic pain syndrome    potassium chloride SA (K-DUR,KLOR-CON) 20 MEQ tablet Take 1 tablet (20 mEq total) by mouth every morning. Qty: 30 tablet, Refills: 3   Associated Diagnoses: Pulmonary HTN (HCC)    zolpidem (AMBIEN) 10 MG tablet Take 1 tablet (10 mg total) by mouth at bedtime as needed for sleep. Qty: 30 tablet, Refills: 0   Associated Diagnoses: Insomnia       No Known Allergies   Significant Diagnostic Studies: Dg Chest 2 View  06/01/2015  CLINICAL DATA:  Rib cage pain and mid back pain. Sickle cell crisis. EXAM: CHEST  2 VIEW COMPARISON:  05/07/2015 FINDINGS: Chronic cardiomegaly. Pulmonary vascularity is normal. No infiltrates or effusions. Power port in place. No bone abnormality. IMPRESSION: No acute abnormality.  Chronic cardiomegaly. Electronically Signed   By: Lorriane Shire M.D.   On: 06/01/2015 10:08   Dg Chest 2  View  05/07/2015  CLINICAL DATA:  Sickle cell pain crisis, chest pain EXAM: CHEST  2 VIEW COMPARISON:  04/26/2015 FINDINGS: Left subclavian power port catheter tip SVC RA junction. Stable cardiomegaly with vascular congestion and basilar atelectasis/ parenchymal scarring. No superimposed pneumonia, collapse or consolidation. No edema, effusion or pneumothorax. Trachea midline. IMPRESSION: Stable cardiomegaly with vascular congestion and parenchymal scarring. No superimposed acute process. Electronically Signed   By: Jerilynn Mages.  Shick M.D.   On: 05/07/2015 09:05    Signed:  [name] MD, [place], [place], [place], CPE   06/01/2015, 4:52 PM

## 2015-06-01 NOTE — H&P (Signed)
[name] History and Physical  [name] [id number] DOB: 1980/11/19 DOA: 06/01/2015  PCP: [name], MD   Chief Complaint: Sickle Cell Pain x 3 days  HPI: [name] is a 35 y.o. male with history of sickle cell anemia and multiple complications as listed below presented to the day hospital with complaint of generalized body pain mostly on his ribs bilaterally. No cough. No dizziness. No syncopal attack. Patient was in the ED earlier this morning where he was sent to the [facility] for observation and pain management. In the emergency department, his comprehensive metabolic panel was normal, his hemoglobin is at baseline and no acute findings on evaluation. Patient was also seen in the ED yesterday for the same complaint, he is known to utilize ED, inpatient and day hospital services very frequently. He currently rates his pain at an 8, described as constant, throbbing. He took his Dilaudid this morning at 6:30 AM with no sustained relief. He is out of his long acting pain medication. He denies any headache, no urinary symptoms, no change in bowel habit.  Systemic Review: General: The patient denies anorexia, fever, weight loss Cardiac: Denies chest pain, syncope, palpitations, pedal edema  Respiratory: Denies cough, shortness of breath, wheezing GI: Denies severe indigestion/heartburn, abdominal pain, nausea, vomiting, diarrhea and constipation GU: Denies hematuria, incontinence, dysuria  Skin: Denies suspicious skin lesions Neurologic: Denies focal weakness or numbness, change in vision  Past Medical History  Diagnosis Date  . Sickle cell anemia (HCC)   . Blood transfusion   . Acute embolism and thrombosis of right internal jugular vein ([place])   . Hypokalemia   . Mood disorder ([place])   . History of pulmonary embolus (PE)   . Avascular necrosis (HCC)     Right Hip  . Leukocytosis     Chronic  . Thrombocytosis (HCC)     Chronic  .  Hypertension   . History of Clostridium difficile infection   . Uses marijuana   . Chronic anticoagulation   . Functional asplenia   . Former smoker   . Second hand tobacco smoke exposure   . Alcohol consumption of one to four drinks per day   . Noncompliance with medication regimen   . Sickle-cell crisis with associated acute chest syndrome ([place]) 05/13/2013  . Acute chest syndrome ([place]) 06/18/2013  . Demand ischemia ([place]) 01/02/2014  . Pulmonary hypertension ([place])   . Hb-SS disease with crisis ([place])   . Oxygen deficiency     Past Surgical History  Procedure Laterality Date  . Right hip replacement      08/2006  . Cholecystectomy      01/2008  . Porta cath placement    . Porta cath removal    . Umbilical hernia repair      01/2008  . Excision of left periauricular cyst      10/2009  . Excision of right ear lobe cyst with primary closur      11/2007  . Portacath placement  01/05/2012    Procedure: INSERTION PORT-A-CATH;  Surgeon: Odis Hollingshead, MD;  Location: [facility];  Service: General;  Laterality: N/A;  ultrasound guiced port a cath insertion with fluoroscopy    No Known Allergies  Family History  Problem Relation Age of Onset  . Sickle cell trait Mother   . Depression Mother   . Diabetes Mother   . Sickle cell trait Father   . Sickle cell trait Brother  Prior to Admission medications   Medication Sig Start Date End Date Taking? Authorizing Provider  aspirin 81 MG chewable tablet Chew 1 tablet (81 mg total) by mouth daily. 07/23/14   [name], MD  Cholecalciferol (VITAMIN D) 2000 units tablet Take 1 tablet (2,000 Units total) by mouth daily. 02/23/15   Dorena Dew, FNP  cyclobenzaprine (FLEXERIL) 5 MG tablet Take 1 tablet (5 mg total) by mouth 3 (three) times daily as needed for muscle spasms. 02/19/15   Alfonzo Beers, MD  enoxaparin (LOVENOX) 120 MG/0.8ML injection Inject 0.8 mLs (120 mg total) into the skin daily. 12/03/14   [name], MD   folic acid (FOLVITE) 1 MG tablet Take 1 tablet (1 mg total) by mouth every morning. 02/23/15   Dorena Dew, FNP  gabapentin (NEURONTIN) 300 MG capsule Take 1 capsule (300 mg total) by mouth 3 (three) times daily. 05/04/15   Dorena Dew, FNP  HYDROmorphone (DILAUDID) 4 MG tablet Take 1 tablet (4 mg total) by mouth every 4 (four) hours as needed for severe pain. 05/05/15   Dorena Dew, FNP  hydroxyurea (HYDREA) 500 MG capsule Take 1 capsule (500 mg total) by mouth daily. May take with food to minimize GI side effects. Patient taking differently: Take 1,000 mg by mouth daily. May take with food to minimize GI side effects. 11/06/14   Dorena Dew, FNP  lisinopril (PRINIVIL,ZESTRIL) 10 MG tablet Take 1 tablet (10 mg total) by mouth daily. 04/13/15   Dorena Dew, FNP  metoprolol succinate (TOPROL-XL) 50 MG 24 hr tablet Take 1 tablet (50 mg total) by mouth daily. 02/23/15   Dorena Dew, FNP  morphine (MS CONTIN) 30 MG 12 hr tablet Take 1 tablet (30 mg total) by mouth every 12 (twelve) hours. 05/05/15   Dorena Dew, FNP  potassium chloride SA (K-DUR,KLOR-CON) 20 MEQ tablet Take 1 tablet (20 mEq total) by mouth every morning. 04/13/15   Dorena Dew, FNP  zolpidem (AMBIEN) 10 MG tablet Take 1 tablet (10 mg total) by mouth at bedtime as needed for sleep. 05/05/15   Dorena Dew, FNP     Physical Exam: There were no vitals filed for this visit.  General: Alert, awake, afebrile, anicteric, not in obvious distress HEENT: Normocephalic and Atraumatic, Mucous membranes pink                PERRLA; EOM intact; mild scleral icterus,                 Nares: Patent, Oropharynx: Clear, Fair Dentition                 Neck: FROM, no cervical lymphadenopathy, thyromegaly, carotid bruit or JVD;  CHEST WALL: No tenderness  CHEST: Normal respiration, clear to auscultation bilaterally  HEART: Regular rate and rhythm; no murmurs rubs or gallops  BACK: No kyphosis or scoliosis; no CVA  tenderness  ABDOMEN: Positive Bowel Sounds, soft, non-tender; no masses, no organomegaly Rectal Exam: deferred EXTREMITIES: No cyanosis, clubbing, or edema SKIN:  no rash or ulceration  CNS: Alert and Oriented x 4, Nonfocal exam, CN 2-12 intact  Labs on Admission:  Basic Metabolic Panel:  Recent Labs Lab 05/27/15 0922 05/31/15 0750 06/01/15 1111  NA 136 138 138  K 4.3 4.3 4.4  CL 106 110 111  CO2 21* 19* 18*  GLUCOSE 126* 108* 99  BUN 14 15 13   CREATININE 0.85 0.75 0.76  CALCIUM 9.0 9.2 9.0  Liver Function Tests:  Recent Labs Lab 05/27/15 0922 05/31/15 0750 06/01/15 1111  AST 45* 36 38  ALT 25 22 20   ALKPHOS 88 72 71  BILITOT 6.6* 4.6* 5.5*  PROT 8.0 7.6 7.8  ALBUMIN 4.4 4.3 4.5   No results for input(s): LIPASE, AMYLASE in the last 168 hours. No results for input(s): AMMONIA in the last 168 hours. CBC:  Recent Labs Lab 05/27/15 0922 05/31/15 0750 06/01/15 1111  WBC 15.1* 16.9* 15.0*  NEUTROABS 11.4* 12.1* 10.8*  HGB 7.1* 6.6* 6.6*  HCT 20.5* 19.2* 18.9*  MCV 88.4 88.5 87.9  PLT 532* 528* 497*   Cardiac Enzymes: No results for input(s): CKTOTAL, CKMB, CKMBINDEX, TROPONINI in the last 168 hours.  BNP (last 3 results)  Recent Labs  08/29/14 0810 09/10/14 0612 11/30/14 0910  BNP 1117.0* 549.5* 639.7*    ProBNP (last 3 results) No results for input(s): PROBNP in the last 8760 hours.  CBG: No results for input(s): GLUCAP in the last 168 hours.   Assessment/Plan Active Problems:   Sickle cell anemia with pain ([place])   Admits to the Day Hospital  IVF D5 .45% Saline @ 125 mls/hour  Weight based Dilaudid PCA started within 30 minutes of admission  IV Toradol Q 6 H  Monitor vitals very closely, Re-evaluate pain scale every hour  2 L Oxygen by [place]  Patient will be re-evaluated for pain in the context of function and relationship to baseline as care progresses.  If no significant relieve from pain (remains above 5/10) will transfer  patient to inpatient services for further evaluation and management  Code Status: Full  Family Communication: None  DVT Prophylaxis: Ambulate as tolerated   Time spent: 39 Minutes  Dovie Kapusta, MD, MHA, FACP, FAAP, CPE  If 7PM-7AM, please contact night-coverage [email or web address] 06/01/2015, 1:28 PM

## 2015-06-01 NOTE — Telephone Encounter (Signed)
Called patient back after speaking with Dr. Doreene Burke. Pt was told to continue to take his meds as prescribed and drink lots of water and get his rest. Pt voiced understanding.

## 2015-06-01 NOTE — ED Notes (Signed)
PT DISCHARGED. INSTRUCTIONS GIVEN. AAOX3. PT IN NO APPARENT DISTRESS. THE OPPORTUNITY TO ASK QUESTIONS WAS PROVIDED. 

## 2015-06-01 NOTE — Discharge Instructions (Signed)
Transfer to Sickle cell unit/clinic

## 2015-06-01 NOTE — ED Notes (Signed)
Provided pt with something to drink. 

## 2015-06-01 NOTE — ED Notes (Signed)
Pain medication given in Triage. Patient advised about side effects of medications and  to avoid driving for a minimum of 4 hours.  

## 2015-06-01 NOTE — ED Notes (Addendum)
BOBBIE, RN HERE TO TRANSPORT PT TO THE SICKLE CELL CLINIC FOR FURTHER EVALUATION. AAOX3. PT IN NO APPARENT DISTRESS. THE OPPORTUNITY TO ASK QUESTIONS WAS PROVIDED. LEFT CHEST PORT-A-CATH ACCESS IN PLACE.

## 2015-06-01 NOTE — Telephone Encounter (Signed)
Patient called requesting to come to the [facility] Day hospital for treatment. Patient stated his pain was 9/10 and the pain was in his lower back and legs. He had his last pain med at 0630 am, which was Dilaudid. Denies shortness of breathe, abd pain, priapism, nausea, vomiting or fever. Will check with the provider and give him a call back. Patient voiced understanding.

## 2015-06-01 NOTE — Progress Notes (Signed)
Pt received to the [facility] for treatment. Pt stated his pain was 8 on admission. Pt was treated with IV fluids, Dilaudid PCA and Toradol. Pt's pain went down to 5 at discharge. Pt voiced understanding of discharge instructions and was given prescriptions of Dilaudid, Neurontin, MS Contin, Ambien and Hydrea. Pt was oriented, alert and ambulatory at discharge.

## 2015-06-01 NOTE — ED Provider Notes (Signed)
CSN: [id number]     Arrival date & time 06/01/15  [id number] History   First MD Initiated Contact with Patient 06/01/15 1033     Chief Complaint  Patient presents with  . Sickle Cell Pain Crisis  . Back Pain     (Consider location/radiation/quality/duration/timing/severity/associated sxs/prior Treatment) Patient is a 35 y.o. male presenting with sickle cell pain and back pain. The history is provided by the patient.  Sickle Cell Pain Crisis Associated symptoms: no chest pain, no cough, no fever, no headaches, no shortness of breath, no sore throat and no vomiting   Back Pain Associated symptoms: no abdominal pain, no chest pain, no dysuria, no fever and no headaches   Patient with hx sickle cell disease c/o pain bil mid to lower back, states it is the same pain he has had in past due to sickle cell/pain crisis. Pain constant, dull, severe, non radiating, partially relieved w home meds. Denies injury, fall or strain. No numbness/weakness. No anterior/chest pain. No sob. No cough or uri c/o. No fever or chills. No abd pain. No nvd. Compliant w home meds.       Past Medical History  Diagnosis Date  . Sickle cell anemia (HCC)   . Blood transfusion   . Acute embolism and thrombosis of right internal jugular vein ([place])   . Hypokalemia   . Mood disorder ([place])   . History of pulmonary embolus (PE)   . Avascular necrosis (HCC)     Right Hip  . Leukocytosis     Chronic  . Thrombocytosis (HCC)     Chronic  . Hypertension   . History of Clostridium difficile infection   . Uses marijuana   . Chronic anticoagulation   . Functional asplenia   . Former smoker   . Second hand tobacco smoke exposure   . Alcohol consumption of one to four drinks per day   . Noncompliance with medication regimen   . Sickle-cell crisis with associated acute chest syndrome ([place]) 05/13/2013  . Acute chest syndrome ([place]) 06/18/2013  . Demand ischemia ([place]) 01/02/2014  . Pulmonary hypertension ([place])   . Hb-SS disease  with crisis ([place])   . Oxygen deficiency    Past Surgical History  Procedure Laterality Date  . Right hip replacement      08/2006  . Cholecystectomy      01/2008  . Porta cath placement    . Porta cath removal    . Umbilical hernia repair      01/2008  . Excision of left periauricular cyst      10/2009  . Excision of right ear lobe cyst with primary closur      11/2007  . Portacath placement  01/05/2012    Procedure: INSERTION PORT-A-CATH;  Surgeon: Odis Hollingshead, MD;  Location: [facility];  Service: General;  Laterality: N/A;  ultrasound guiced port a cath insertion with fluoroscopy   Family History  Problem Relation Age of Onset  . Sickle cell trait Mother   . Depression Mother   . Diabetes Mother   . Sickle cell trait Father   . Sickle cell trait Brother    Social History  Substance Use Topics  . Smoking status: Former Smoker -- 0.50 packs/day for 10 years    Types: Cigarettes    Quit date: 05/29/2011  . Smokeless tobacco: Never Used  . Alcohol Use: No    Review of Systems  Constitutional: Negative for fever and chills.  HENT: Negative for sore throat.  Eyes: Negative for redness.  Respiratory: Negative for cough and shortness of breath.   Cardiovascular: Negative for chest pain.  Gastrointestinal: Negative for vomiting, abdominal pain and diarrhea.  Genitourinary: Negative for dysuria and hematuria.  Musculoskeletal: Positive for back pain. Negative for neck pain.  Skin: Negative for rash.  Neurological: Negative for headaches.  Hematological: Does not bruise/bleed easily.  Psychiatric/Behavioral: Negative for confusion.      Allergies  Review of patient's allergies indicates no known allergies.  Home Medications   Prior to Admission medications   Medication Sig Start Date End Date Taking? Authorizing Provider  aspirin 81 MG chewable tablet Chew 1 tablet (81 mg total) by mouth daily. 07/23/14  Yes [name], MD  Cholecalciferol (VITAMIN D) 2000  units tablet Take 1 tablet (2,000 Units total) by mouth daily. 02/23/15  Yes Dorena Dew, FNP  cyclobenzaprine (FLEXERIL) 5 MG tablet Take 1 tablet (5 mg total) by mouth 3 (three) times daily as needed for muscle spasms. 02/19/15  Yes Alfonzo Beers, MD  enoxaparin (LOVENOX) 120 MG/0.8ML injection Inject 0.8 mLs (120 mg total) into the skin daily. 12/03/14  Yes [name], MD  folic acid (FOLVITE) 1 MG tablet Take 1 tablet (1 mg total) by mouth every morning. 02/23/15  Yes Dorena Dew, FNP  gabapentin (NEURONTIN) 300 MG capsule Take 1 capsule (300 mg total) by mouth 3 (three) times daily. 05/04/15  Yes Dorena Dew, FNP  HYDROmorphone (DILAUDID) 4 MG tablet Take 1 tablet (4 mg total) by mouth every 4 (four) hours as needed for severe pain. 05/05/15  Yes Dorena Dew, FNP  hydroxyurea (HYDREA) 500 MG capsule Take 1 capsule (500 mg total) by mouth daily. May take with food to minimize GI side effects. Patient taking differently: Take 1,000 mg by mouth daily. May take with food to minimize GI side effects. 11/06/14  Yes Dorena Dew, FNP  lisinopril (PRINIVIL,ZESTRIL) 10 MG tablet Take 1 tablet (10 mg total) by mouth daily. 04/13/15  Yes Dorena Dew, FNP  metoprolol succinate (TOPROL-XL) 50 MG 24 hr tablet Take 1 tablet (50 mg total) by mouth daily. 02/23/15  Yes Dorena Dew, FNP  morphine (MS CONTIN) 30 MG 12 hr tablet Take 1 tablet (30 mg total) by mouth every 12 (twelve) hours. 05/05/15  Yes Dorena Dew, FNP  potassium chloride SA (K-DUR,KLOR-CON) 20 MEQ tablet Take 1 tablet (20 mEq total) by mouth every morning. 04/13/15  Yes Dorena Dew, FNP  zolpidem (AMBIEN) 10 MG tablet Take 1 tablet (10 mg total) by mouth at bedtime as needed for sleep. 05/05/15  Yes Dorena Dew, FNP   BP 97/66 mmHg  Pulse 85  Temp(Src) 98.4 F (36.9 C) (Oral)  Resp 18  SpO2 98% Physical Exam  Constitutional: He is oriented to person, place, and time. He appears well-developed and  well-nourished. No distress.  HENT:  Mouth/Throat: Oropharynx is clear and moist.  Eyes: Conjunctivae are normal. Pupils are equal, round, and reactive to light. No scleral icterus.  Neck: Neck supple. No tracheal deviation present.  Cardiovascular: Normal rate, regular rhythm, normal heart sounds and intact distal pulses.   No murmur heard. Pulmonary/Chest: Effort normal and breath sounds normal. No accessory muscle usage. No respiratory distress.  Port without sign of infection  Abdominal: Soft. Bowel sounds are normal. He exhibits no distension. There is no tenderness.  Genitourinary:  No cva tenderness  Musculoskeletal: Normal range of motion. He exhibits no edema or tenderness.  TLS spine non tender.   Neurological: He is alert and oriented to person, place, and time.  Steady gait.   Skin: Skin is warm and dry. He is not diaphoretic.  Psychiatric: He has a normal mood and affect.  Nursing note and vitals reviewed.   ED Course  Procedures (including critical care time) Labs Review   Results for orders placed or performed during the hospital encounter of 06/01/15  Comprehensive metabolic panel  Result Value Ref Range   Sodium 138 135 - 145 mmol/L   Potassium 4.4 3.5 - 5.1 mmol/L   Chloride 111 101 - 111 mmol/L   CO2 18 (L) 22 - 32 mmol/L   Glucose, Bld 99 65 - 99 mg/dL   BUN 13 6 - 20 mg/dL   Creatinine, Ser 0.76 0.61 - 1.24 mg/dL   Calcium 9.0 8.9 - 10.3 mg/dL   Total Protein 7.8 6.5 - 8.1 g/dL   Albumin 4.5 3.5 - 5.0 g/dL   AST 38 15 - 41 U/L   ALT 20 17 - 63 U/L   Alkaline Phosphatase 71 38 - 126 U/L   Total Bilirubin 5.5 (H) 0.3 - 1.2 mg/dL   GFR calc non Af Amer >60 >60 mL/min   GFR calc Af Amer >60 >60 mL/min   Anion gap 9 5 - 15  CBC with Differential  Result Value Ref Range   WBC 15.0 (H) 4.0 - 10.5 K/uL   RBC 2.15 (L) 4.22 - 5.81 MIL/uL   Hemoglobin 6.6 (LL) 13.0 - 17.0 g/dL   HCT 18.9 (L) 39.0 - 52.0 %   MCV 87.9 78.0 - 100.0 fL   MCH 30.7 26.0 - 34.0  pg   MCHC 34.9 30.0 - 36.0 g/dL   RDW 23.9 (H) 11.5 - 15.5 %   Platelets 497 (H) 150 - 400 K/uL   Neutrophils Relative % 73 %   Lymphocytes Relative 11 %   Monocytes Relative 13 %   Eosinophils Relative 3 %   Basophils Relative 0 %   nRBC 2 (H) 0 /100 WBC   Neutro Abs 10.8 (H) 1.7 - 7.7 K/uL   Lymphs Abs 1.7 0.7 - 4.0 K/uL   Monocytes Absolute 2.0 (H) 0.1 - 1.0 K/uL   Eosinophils Absolute 0.5 0.0 - 0.7 K/uL   Basophils Absolute 0.0 0.0 - 0.1 K/uL   RBC Morphology POLYCHROMASIA PRESENT    *Note: Due to a large number of results and/or encounters for the requested time period, some results have not been displayed. A complete set of results can be found in Results Review.   Dg Chest 2 View  06/01/2015  CLINICAL DATA:  Rib cage pain and mid back pain. Sickle cell crisis. EXAM: CHEST  2 VIEW COMPARISON:  05/07/2015 FINDINGS: Chronic cardiomegaly. Pulmonary vascularity is normal. No infiltrates or effusions. Power port in place. No bone abnormality. IMPRESSION: No acute abnormality.  Chronic cardiomegaly. Electronically Signed   By: Lorriane Shire M.D.   On: 06/01/2015 10:08    I have personally reviewed and evaluated these images and lab results as part of my medical decision-making.   MDM   Iv ns. o2 . 500 cc ns bolus.  Dilaudid iv.   Reviewed nursing notes and prior charts for additional history.  Recheck patient appears comfortable, states pain no better. Dilaudid iv.  Pt requests benadryl re itching, no rash noted, no wheezing.  Benadryl ivpb.    Will contact sickle cell clinic.   Patient accepted to go directly to  sickle cell unit/outpatient clinic now.    Patient currently appears stable to be taken to sickle cell treatment area.       Lajean Saver, MD 06/01/15 [phone]

## 2015-06-01 NOTE — Progress Notes (Signed)
Pt arrived at [facility] for evaluation; Dr. Doreene Burke notified for new orders: will continue to monitor

## 2015-06-01 NOTE — Discharge Instructions (Signed)
Sickle Cell Anemia, Adult Sickle cell anemia is a condition in which red blood cells have an abnormal "sickle" shape. This abnormal shape shortens the cells' life span, which results in a lower than normal concentration of red blood cells in the blood. The sickle shape also causes the cells to clump together and block free blood flow through the blood vessels. As a result, the tissues and organs of the body do not receive enough oxygen. Sickle cell anemia causes organ damage and pain and increases the risk of infection. CAUSES  Sickle cell anemia is a genetic disorder. Those who receive two copies of the gene have the condition, and those who receive one copy have the trait. RISK FACTORS The sickle cell gene is most common in people whose families originated in [place]. Other areas of the globe where sickle cell trait occurs include the [place], [place] and [place], the [place], and the [place].  SIGNS AND SYMPTOMS  Pain, especially in the extremities, back, chest, or abdomen (common). The pain may start suddenly or may develop following an illness, especially if there is dehydration. Pain can also occur due to overexertion or exposure to extreme temperature changes.  Frequent severe bacterial infections, especially certain types of pneumonia and meningitis.  Pain and swelling in the hands and feet.  Decreased activity.   Loss of appetite.   Change in behavior.  Headaches.  Seizures.  Shortness of breath or difficulty breathing.  Vision changes.  Skin ulcers. Those with the trait may not have symptoms or they may have mild symptoms.  DIAGNOSIS  Sickle cell anemia is diagnosed with blood tests that demonstrate the genetic trait. It is often diagnosed during the newborn period, due to mandatory testing nationwide. A variety of blood tests, X-rays, CT scans, MRI scans, ultrasounds, and lung function tests may also be done to monitor the condition. TREATMENT  Sickle  cell anemia may be treated with:  Medicines. You may be given pain medicines, antibiotic medicines (to treat and prevent infections) or medicines to increase the production of certain types of hemoglobin.  Fluids.  Oxygen.  Blood transfusions. HOME CARE INSTRUCTIONS   Drink enough fluid to keep your urine clear or pale yellow. Increase your fluid intake in hot weather and during exercise.  Do not smoke. Smoking lowers oxygen levels in the blood.   Only take over-the-counter or prescription medicines for pain, fever, or discomfort as directed by your health care provider.  Take antibiotics as directed by your health care provider. Make sure you finish them it even if you start to feel better.   Take supplements as directed by your health care provider.   Consider wearing a medical alert bracelet. This tells anyone caring for you in an emergency of your condition.   When traveling, keep your medical information, health care provider's names, and the medicines you take with you at all times.   If you develop a fever, do not take medicines to reduce the fever right away. This could cover up a problem that is developing. Notify your health care provider.  Keep all follow-up appointments with your health care provider. Sickle cell anemia requires regular medical care. SEEK MEDICAL CARE IF: You have a fever. SEEK IMMEDIATE MEDICAL CARE IF:   You feel dizzy or faint.   You have new abdominal pain, especially on the left side near the stomach area.   You develop a persistent, often uncomfortable and painful penile erection (priapism). If this is not treated immediately it   will lead to impotence.   You have numbness your arms or legs or you have a hard time moving them.   You have a hard time with speech.   You have a fever or persistent symptoms for more than 2-3 days.   You have a fever and your symptoms suddenly get worse.   You have signs or symptoms of infection.  These include:   Chills.   Abnormal tiredness (lethargy).   Irritability.   Poor eating.   Vomiting.   You develop pain that is not helped with medicine.   You develop shortness of breath.  You have pain in your chest.   You are coughing up pus-like or bloody sputum.   You develop a stiff neck.  Your feet or hands swell or have pain.  Your abdomen appears bloated.  You develop joint pain. MAKE SURE YOU:  Understand these instructions.   This information is not intended to replace advice given to you by your health care provider. Make sure you discuss any questions you have with your health care provider.   Document Released: 04/27/2005 Document Revised: 02/07/2014 Document Reviewed: 08/29/2012 Elsevier Interactive Patient Education 2016 [employer]  

## 2015-06-02 ENCOUNTER — Inpatient Hospital Stay (HOSPITAL_COMMUNITY): Payer: Medicare Other

## 2015-06-02 ENCOUNTER — Encounter (HOSPITAL_COMMUNITY): Payer: Self-pay

## 2015-06-02 ENCOUNTER — Emergency Department (HOSPITAL_COMMUNITY): Payer: Medicare Other

## 2015-06-02 ENCOUNTER — Inpatient Hospital Stay (HOSPITAL_COMMUNITY)
Admission: EM | Admit: 2015-06-02 | Discharge: 2015-06-08 | DRG: 811 | Disposition: A | Discharge status: Home / Self Care | Payer: Medicare Other | Attending: Internal Medicine | Admitting: Internal Medicine

## 2015-06-02 DIAGNOSIS — G894 Chronic pain syndrome: Secondary | ICD-10-CM | POA: Diagnosis present

## 2015-06-02 DIAGNOSIS — R079 Chest pain, unspecified: Secondary | ICD-10-CM | POA: Diagnosis not present

## 2015-06-02 DIAGNOSIS — D599 Acquired hemolytic anemia, unspecified: Secondary | ICD-10-CM | POA: Diagnosis not present

## 2015-06-02 DIAGNOSIS — D5701 Hb-SS disease with acute chest syndrome: Secondary | ICD-10-CM

## 2015-06-02 DIAGNOSIS — R109 Unspecified abdominal pain: Secondary | ICD-10-CM | POA: Diagnosis not present

## 2015-06-02 DIAGNOSIS — R740 Nonspecific elevation of levels of transaminase and lactic acid dehydrogenase [LDH]: Secondary | ICD-10-CM | POA: Diagnosis present

## 2015-06-02 DIAGNOSIS — I10 Essential (primary) hypertension: Secondary | ICD-10-CM | POA: Diagnosis present

## 2015-06-02 DIAGNOSIS — Z7722 Contact with and (suspected) exposure to environmental tobacco smoke (acute) (chronic): Secondary | ICD-10-CM | POA: Diagnosis present

## 2015-06-02 DIAGNOSIS — M549 Dorsalgia, unspecified: Secondary | ICD-10-CM | POA: Diagnosis not present

## 2015-06-02 DIAGNOSIS — D5700 Hb-SS disease with crisis, unspecified: Secondary | ICD-10-CM | POA: Diagnosis not present

## 2015-06-02 DIAGNOSIS — R0902 Hypoxemia: Secondary | ICD-10-CM

## 2015-06-02 DIAGNOSIS — Z86711 Personal history of pulmonary embolism: Secondary | ICD-10-CM | POA: Diagnosis present

## 2015-06-02 DIAGNOSIS — R509 Fever, unspecified: Secondary | ICD-10-CM | POA: Insufficient documentation

## 2015-06-02 DIAGNOSIS — Z87891 Personal history of nicotine dependence: Secondary | ICD-10-CM | POA: Diagnosis not present

## 2015-06-02 DIAGNOSIS — Z7901 Long term (current) use of anticoagulants: Secondary | ICD-10-CM | POA: Diagnosis not present

## 2015-06-02 DIAGNOSIS — I272 Other secondary pulmonary hypertension: Secondary | ICD-10-CM | POA: Diagnosis present

## 2015-06-02 DIAGNOSIS — I2699 Other pulmonary embolism without acute cor pulmonale: Secondary | ICD-10-CM | POA: Diagnosis present

## 2015-06-02 DIAGNOSIS — Z818 Family history of other mental and behavioral disorders: Secondary | ICD-10-CM | POA: Diagnosis not present

## 2015-06-02 DIAGNOSIS — R1900 Intra-abdominal and pelvic swelling, mass and lump, unspecified site: Secondary | ICD-10-CM | POA: Diagnosis not present

## 2015-06-02 DIAGNOSIS — D72829 Elevated white blood cell count, unspecified: Secondary | ICD-10-CM | POA: Diagnosis not present

## 2015-06-02 DIAGNOSIS — F39 Unspecified mood [affective] disorder: Secondary | ICD-10-CM | POA: Diagnosis present

## 2015-06-02 DIAGNOSIS — Z86718 Personal history of other venous thrombosis and embolism: Secondary | ICD-10-CM | POA: Diagnosis not present

## 2015-06-02 DIAGNOSIS — J9611 Chronic respiratory failure with hypoxia: Secondary | ICD-10-CM | POA: Diagnosis not present

## 2015-06-02 DIAGNOSIS — Z9114 Patient's other noncompliance with medication regimen: Secondary | ICD-10-CM | POA: Diagnosis not present

## 2015-06-02 DIAGNOSIS — Z833 Family history of diabetes mellitus: Secondary | ICD-10-CM | POA: Diagnosis not present

## 2015-06-02 DIAGNOSIS — Z96641 Presence of right artificial hip joint: Secondary | ICD-10-CM | POA: Diagnosis present

## 2015-06-02 DIAGNOSIS — J9621 Acute and chronic respiratory failure with hypoxia: Secondary | ICD-10-CM | POA: Diagnosis not present

## 2015-06-02 DIAGNOSIS — Q8901 Asplenia (congenital): Secondary | ICD-10-CM | POA: Diagnosis not present

## 2015-06-02 DIAGNOSIS — Z9981 Dependence on supplemental oxygen: Secondary | ICD-10-CM | POA: Diagnosis not present

## 2015-06-02 DIAGNOSIS — D638 Anemia in other chronic diseases classified elsewhere: Secondary | ICD-10-CM | POA: Diagnosis not present

## 2015-06-02 DIAGNOSIS — M879 Osteonecrosis, unspecified: Secondary | ICD-10-CM | POA: Diagnosis present

## 2015-06-02 DIAGNOSIS — R1032 Left lower quadrant pain: Secondary | ICD-10-CM | POA: Diagnosis present

## 2015-06-02 LAB — URINALYSIS, ROUTINE W REFLEX MICROSCOPIC
Bilirubin Urine: NEGATIVE
GLUCOSE, UA: NEGATIVE mg/dL
Hgb urine dipstick: NEGATIVE
Ketones, ur: NEGATIVE mg/dL
LEUKOCYTES UA: NEGATIVE
Nitrite: NEGATIVE
PROTEIN: NEGATIVE mg/dL
Specific Gravity, Urine: 1.011 (ref 1.005–1.030)
pH: 6.5 (ref 5.0–8.0)

## 2015-06-02 LAB — CBC WITH DIFFERENTIAL/PLATELET
BASOS ABS: 0.2 10*3/uL — AB (ref 0.0–0.1)
Basophils Relative: 1 %
EOS PCT: 2 %
Eosinophils Absolute: 0.3 10*3/uL (ref 0.0–0.7)
HEMATOCRIT: 17.2 % — AB (ref 39.0–52.0)
HEMOGLOBIN: 6.1 g/dL — AB (ref 13.0–17.0)
LYMPHS ABS: 2.7 10*3/uL (ref 0.7–4.0)
Lymphocytes Relative: 16 %
MCH: 30.7 pg (ref 26.0–34.0)
MCHC: 35.5 g/dL (ref 30.0–36.0)
MCV: 86.4 fL (ref 78.0–100.0)
MONOS PCT: 13 %
Monocytes Absolute: 2.2 10*3/uL — ABNORMAL HIGH (ref 0.1–1.0)
NEUTROS ABS: 11.6 10*3/uL — AB (ref 1.7–7.7)
Neutrophils Relative %: 68 %
Platelets: 528 10*3/uL — ABNORMAL HIGH (ref 150–400)
RBC: 1.99 MIL/uL — ABNORMAL LOW (ref 4.22–5.81)
RDW: 22.8 % — ABNORMAL HIGH (ref 11.5–15.5)
WBC: 17 10*3/uL — ABNORMAL HIGH (ref 4.0–10.5)

## 2015-06-02 LAB — COMPREHENSIVE METABOLIC PANEL
ALT: 19 U/L (ref 17–63)
ANION GAP: 7 (ref 5–15)
AST: 41 U/L (ref 15–41)
Albumin: 4.1 g/dL (ref 3.5–5.0)
Alkaline Phosphatase: 70 U/L (ref 38–126)
BUN: 18 mg/dL (ref 6–20)
CHLORIDE: 110 mmol/L (ref 101–111)
CO2: 19 mmol/L — AB (ref 22–32)
Calcium: 8.6 mg/dL — ABNORMAL LOW (ref 8.9–10.3)
Creatinine, Ser: 1.02 mg/dL (ref 0.61–1.24)
Glucose, Bld: 124 mg/dL — ABNORMAL HIGH (ref 65–99)
POTASSIUM: 4.3 mmol/L (ref 3.5–5.1)
SODIUM: 136 mmol/L (ref 135–145)
Total Bilirubin: 4.7 mg/dL — ABNORMAL HIGH (ref 0.3–1.2)
Total Protein: 7.3 g/dL (ref 6.5–8.1)

## 2015-06-02 LAB — RETICULOCYTES
RBC.: 2.01 MIL/uL — AB (ref 4.22–5.81)
RETIC CT PCT: 8.4 % — AB (ref 0.4–3.1)
Retic Count, Absolute: 168.8 10*3/uL (ref 19.0–186.0)

## 2015-06-02 LAB — TROPONIN I: Troponin I: 0.05 ng/mL — ABNORMAL HIGH (ref <0.031)

## 2015-06-02 LAB — LACTATE DEHYDROGENASE: LDH: 456 U/L — ABNORMAL HIGH (ref 98–192)

## 2015-06-02 LAB — I-STAT CG4 LACTIC ACID, ED
LACTIC ACID, VENOUS: 0.61 mmol/L (ref 0.5–2.0)
LACTIC ACID, VENOUS: 0.43 mmol/L — AB (ref 0.5–2.0)

## 2015-06-02 LAB — I-STAT TROPONIN, ED: Troponin i, poc: 0.03 ng/mL (ref 0.00–0.08)

## 2015-06-02 IMAGING — CT CT ANGIO CHEST
1 of 2 series · 18 of 32 positions shown · IV contrast (omnipaque)
Comparison: 02/20/2012

CLINICAL DATA: Pleuritic left chest pain beginning today, cough,
question pulmonary embolism; history sickle cell crisis, pulmonary
embolism, hypertension

CT ANGIOGRAPHY CHEST
TECHNIQUE: Multidetector CT imaging of the chest using the
standard protocol during bolus administration of intravenous
contrast. Multiplanar reconstructed images including MIPs were
obtained and reviewed to evaluate the vascular anatomy.
Contrast: 100mL OMNIPAQUE IOHEXOL 350 MG/ML SOLN

[Series 10: thins for pacs · axial · 0.74mm/px · z∈[-178,+52]mm · 18 of 256 slices shown]
[im 13/256  lung]
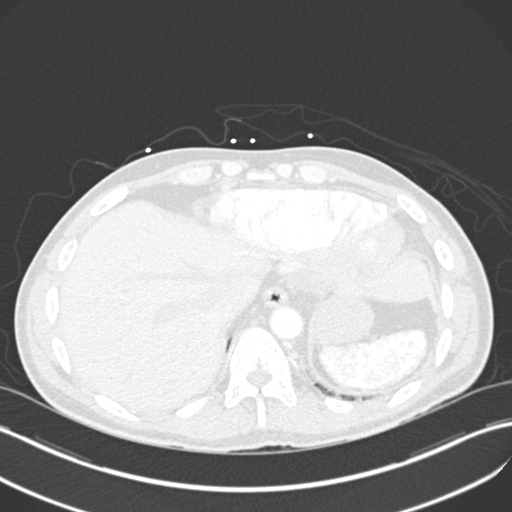
[im 26/256  mediastinal]
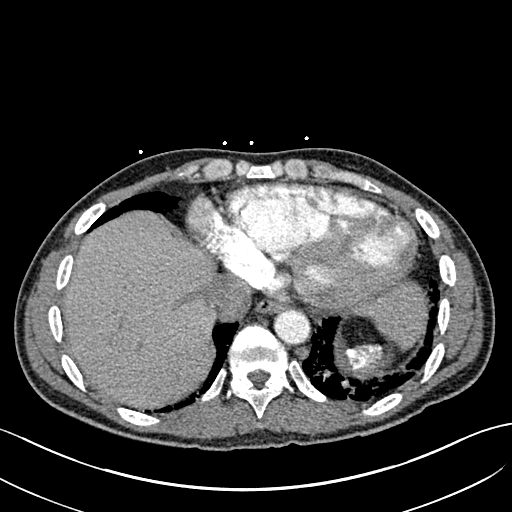
[im 52/256  lung]
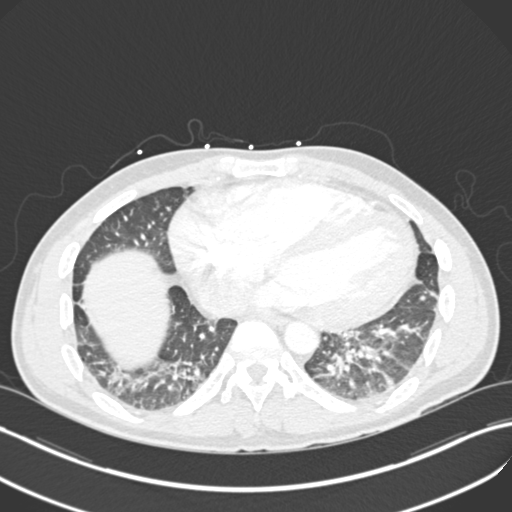
[im 64/256  mediastinal]
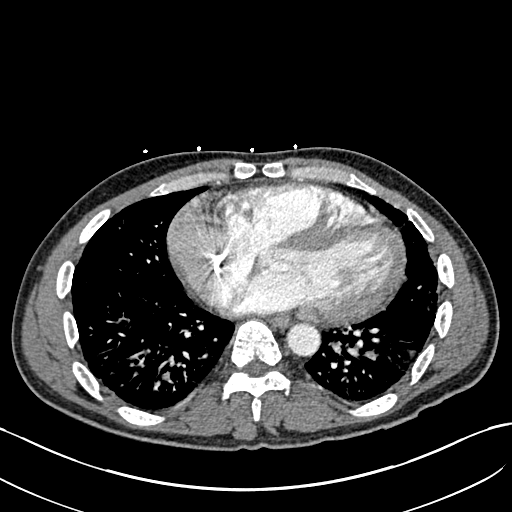
[im 77/256  lung]
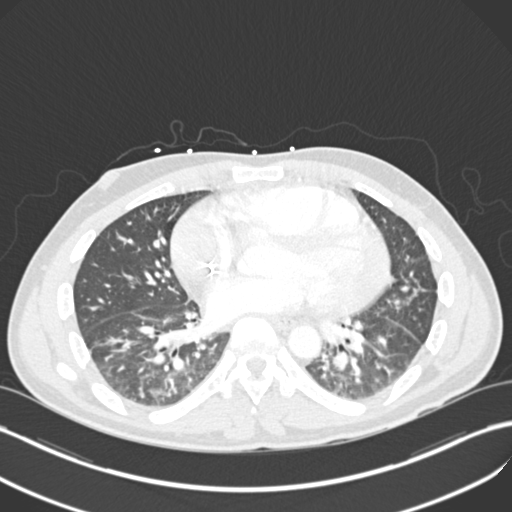
[im 86/256  mediastinal]
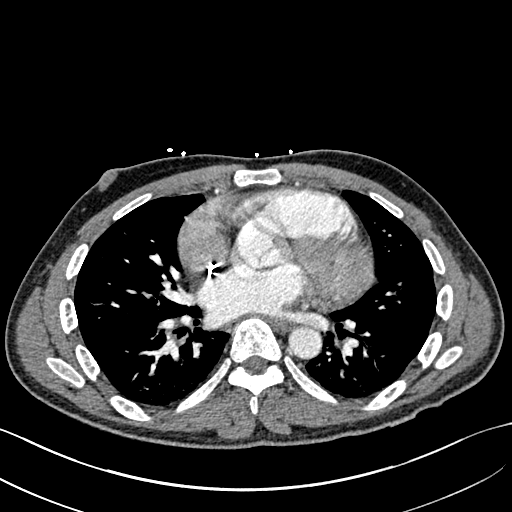
[im 90/256  lung]
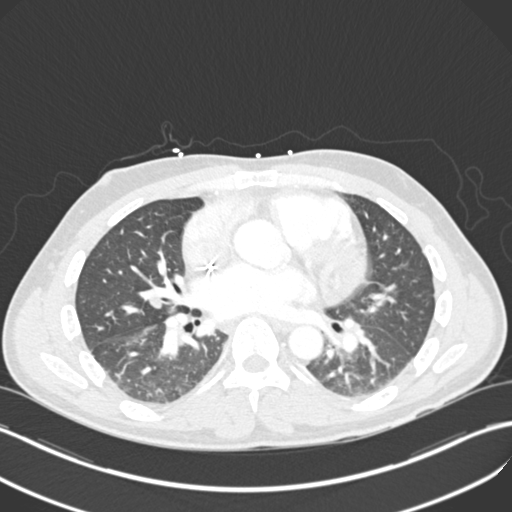
[im 115/256  mediastinal]
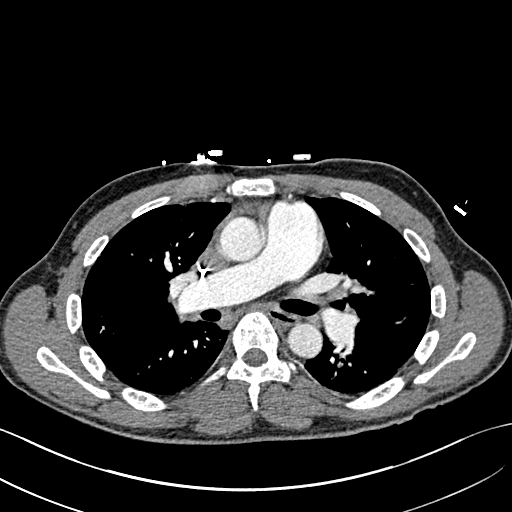
[im 119/256  lung]
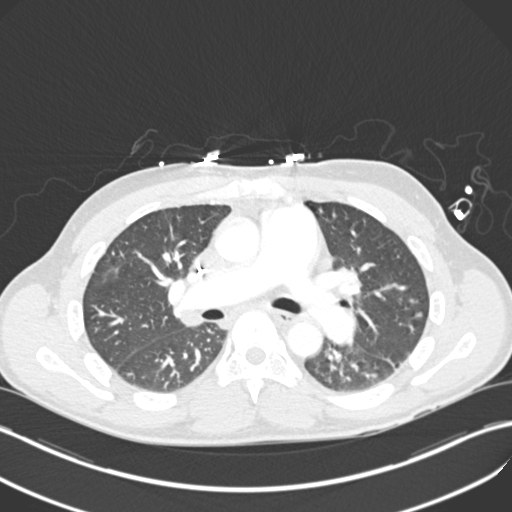
[im 128/256  mediastinal]
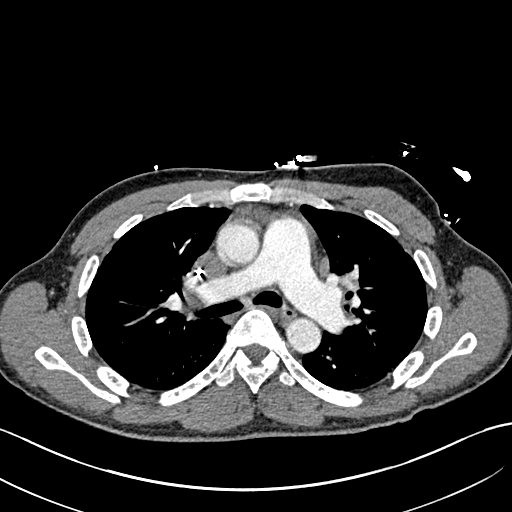
[im 141/256  lung]
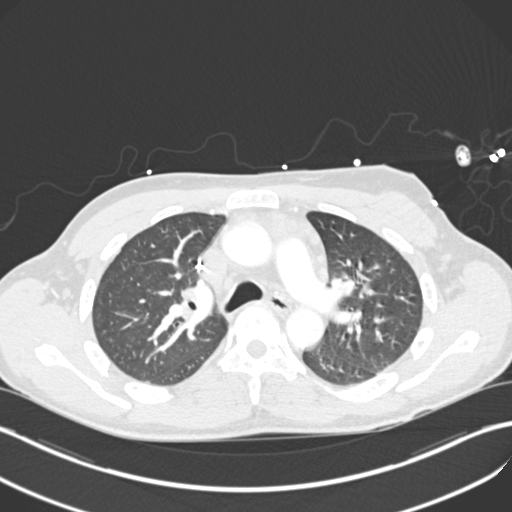
[im 166/256  mediastinal]
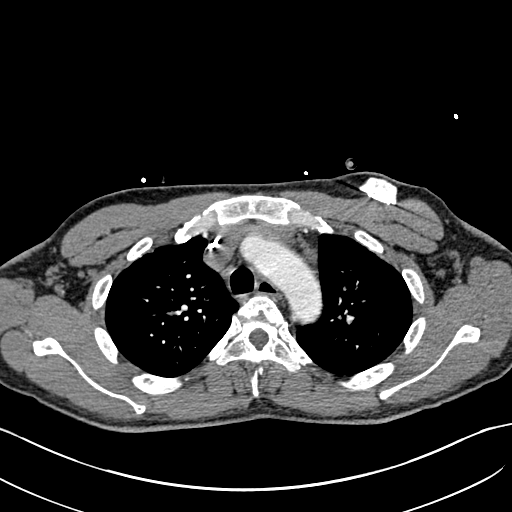
[im 171/256  lung]
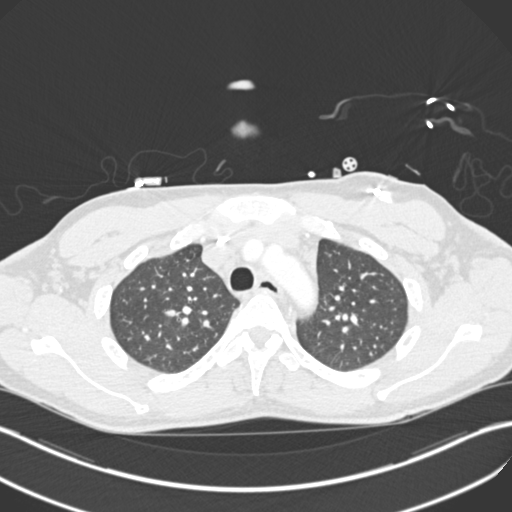
[im 179/256  mediastinal]
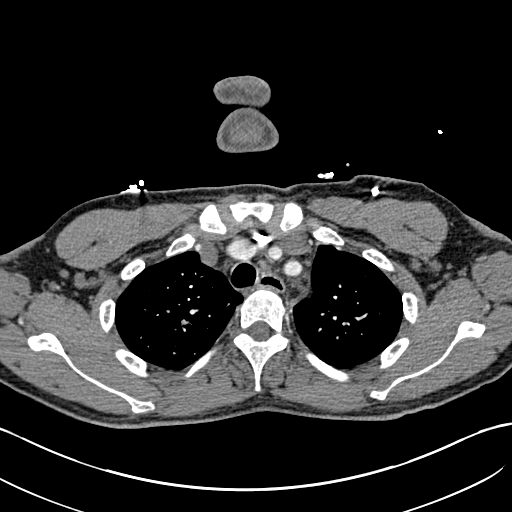
[im 192/256  lung]
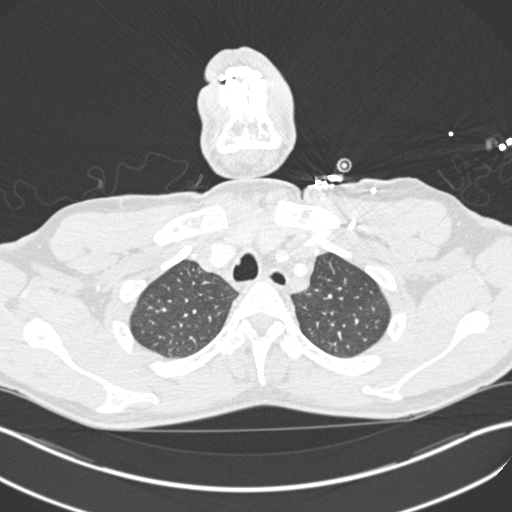
[im 205/256  mediastinal]
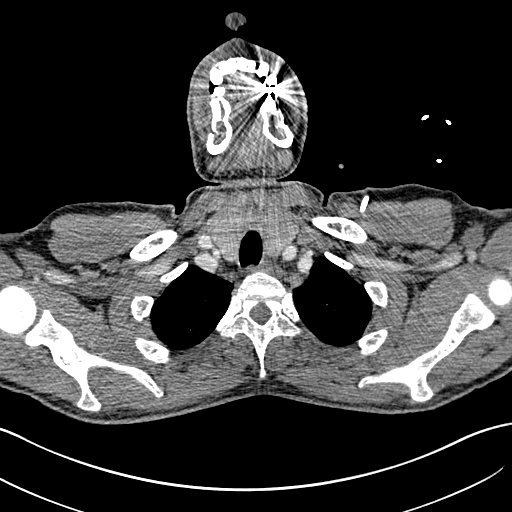
[im 230/256  lung]
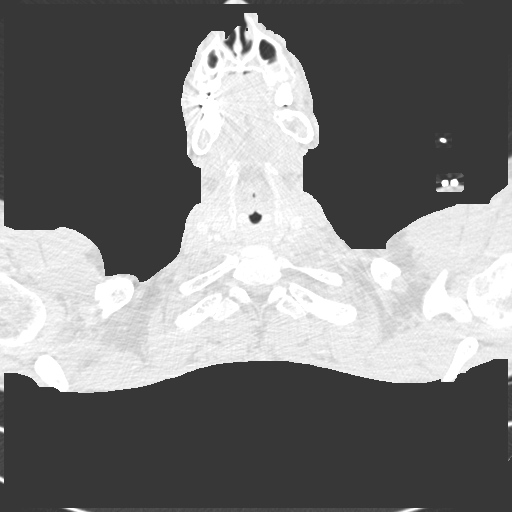
[im 243/256  mediastinal]
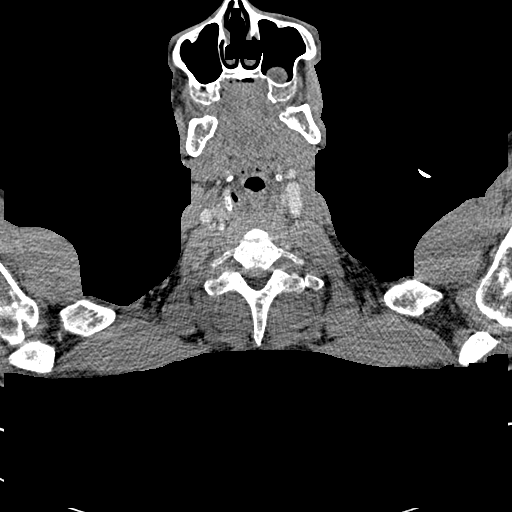

[18 of 32 positions shown; findings below may reference images not displayed]

FINDINGS: Diffuse thyroid enlargement.
Left subclavian Port-A-Cath with tip within the right atrium.
Aorta normal caliber without aneurysm or dissection.
Small filling defects identified within bilateral upper lobe
pulmonary arteries consistent with new pulmonary emboli.
Filling defects in right lower lobe pulmonary artery similar to
previous exam.
Filling defects identified left lower lobe pulmonary arteries, new.

Mildly enlarged right hilar lymph node 19 x 16 mm image 45.
Multiple normal upper normal-sized anterior mediastinal nodes again
seen.
Enlarged azygo esophageal recess node 2.2 x 2.0 cm image 52.
Small calcified spleen.
Visualized portion of liver appears dense, can be seen with
multiple etiologies but in a sickle cell patient who has received
prior transfusions question iron overload or hemochromatosis.

Heart appears enlarged.
Scattered areas of parenchymal lung scarring with bibasilar
atelectasis as well as peripheral triangular density in the left
upper lobe question related to pulmonary embolus.
No definite acute infiltrate pleural effusion or pneumothorax.
Chronic osseous changes related to sickle cell disease again seen
with avascular necrosis at the humeral heads bilaterally.
IMPRESSION: New pulmonary emboli identified in bilateral upper lobe and left
lower lobe pulmonary arteries.
Enlargement of cardiac silhouette consistent with sickle cell
disease.
Scattered areas of atelectasis with triangular peripheral opacity
in the left upper lobe which could be related to a pulmonary
embolus.
Dense liver question hemochromatosis versus iron overload.
Nonspecific enlarged right hilar and azygo esophageal recess lymph
nodes.

Critical Value/emergent results were called by telephone at the
time of interpretation on 07/09/2012 at 7135 hours to Dr.
Jenserik, who verbally acknowledged these results.

## 2015-06-02 MED ORDER — GABAPENTIN 300 MG PO CAPS
300.0000 mg | ORAL_CAPSULE | Freq: Three times a day (TID) | ORAL | Status: DC
  Administered 2015-06-02 – 2015-06-08 (×17): 300 mg via ORAL
  Filled 2015-06-02 (×18): qty 1

## 2015-06-02 MED ORDER — DIPHENHYDRAMINE HCL 12.5 MG/5ML PO ELIX: 12.5000 mg | ORAL_SOLUTION | Freq: Four times a day (QID) | ORAL | Status: DC | PRN

## 2015-06-02 MED ORDER — SENNOSIDES-DOCUSATE SODIUM 8.6-50 MG PO TABS
1.0000 | ORAL_TABLET | Freq: Two times a day (BID) | ORAL | Status: DC
  Administered 2015-06-02 – 2015-06-08 (×12): 1 via ORAL
  Filled 2015-06-02 (×12): qty 1

## 2015-06-02 MED ORDER — METOPROLOL SUCCINATE ER 50 MG PO TB24
50.0000 mg | ORAL_TABLET | Freq: Every day | ORAL | Status: DC
  Administered 2015-06-02 – 2015-06-08 (×7): 50 mg via ORAL
  Filled 2015-06-02 (×7): qty 1

## 2015-06-02 MED ORDER — HYDROMORPHONE HCL 1 MG/ML IJ SOLN
1.0000 mg | Freq: Once | INTRAMUSCULAR | Status: AC
  Administered 2015-06-02: 1 mg via INTRAVENOUS
  Filled 2015-06-02: qty 1

## 2015-06-02 MED ORDER — FOLIC ACID 1 MG PO TABS: 1.0000 mg | ORAL_TABLET | Freq: Every day | ORAL | Status: DC

## 2015-06-02 MED ORDER — SODIUM CHLORIDE 0.9 % IV BOLUS (SEPSIS)
1000.0000 mL | Freq: Once | INTRAVENOUS | Status: AC
  Administered 2015-06-02: 1000 mL via INTRAVENOUS

## 2015-06-02 MED ORDER — ONDANSETRON HCL 4 MG/2ML IJ SOLN: 4.0000 mg | Freq: Four times a day (QID) | INTRAMUSCULAR | Status: DC | PRN

## 2015-06-02 MED ORDER — POLYETHYLENE GLYCOL 3350 17 G PO PACK: 17.0000 g | PACK | Freq: Every day | ORAL | Status: DC | PRN

## 2015-06-02 MED ORDER — SODIUM CHLORIDE 0.45 % IV SOLN
INTRAVENOUS | Status: AC
  Administered 2015-06-02 – 2015-06-03 (×2): via INTRAVENOUS

## 2015-06-02 MED ORDER — NALOXONE HCL 0.4 MG/ML IJ SOLN: 0.4000 mg | INTRAMUSCULAR | Status: DC | PRN

## 2015-06-02 MED ORDER — DIPHENHYDRAMINE HCL 50 MG/ML IJ SOLN
12.5000 mg | Freq: Once | INTRAMUSCULAR | Status: AC
  Administered 2015-06-02: 12.5 mg via INTRAVENOUS
  Filled 2015-06-02: qty 1

## 2015-06-02 MED ORDER — SODIUM CHLORIDE 0.9% FLUSH: 10.0000 mL | INTRAVENOUS | Status: DC | PRN

## 2015-06-02 MED ORDER — LISINOPRIL 10 MG PO TABS
10.0000 mg | ORAL_TABLET | Freq: Every day | ORAL | Status: DC
  Administered 2015-06-02 – 2015-06-08 (×7): 10 mg via ORAL
  Filled 2015-06-02 (×7): qty 1

## 2015-06-02 MED ORDER — MORPHINE SULFATE ER 30 MG PO TBCR
30.0000 mg | EXTENDED_RELEASE_TABLET | Freq: Two times a day (BID) | ORAL | Status: DC
  Administered 2015-06-02 – 2015-06-08 (×12): 30 mg via ORAL
  Filled 2015-06-02 (×12): qty 1

## 2015-06-02 MED ORDER — VANCOMYCIN HCL IN DEXTROSE 1-5 GM/200ML-% IV SOLN
1000.0000 mg | Freq: Once | INTRAVENOUS | Status: AC
  Administered 2015-06-02: 1000 mg via INTRAVENOUS
  Filled 2015-06-02: qty 200

## 2015-06-02 MED ORDER — SODIUM CHLORIDE 0.9 % IV SOLN
12.5000 mg | Freq: Four times a day (QID) | INTRAVENOUS | Status: DC | PRN
  Administered 2015-06-03 (×2): 12.5 mg via INTRAVENOUS
  Filled 2015-06-02 (×2): qty 0.25

## 2015-06-02 MED ORDER — ENOXAPARIN SODIUM 120 MG/0.8ML ~~LOC~~ SOLN
120.0000 mg | SUBCUTANEOUS | Status: DC
  Administered 2015-06-02 – 2015-06-07 (×6): 120 mg via SUBCUTANEOUS
  Filled 2015-06-02 (×8): qty 0.8

## 2015-06-02 MED ORDER — SODIUM CHLORIDE 0.9% FLUSH: 9.0000 mL | INTRAVENOUS | Status: DC | PRN

## 2015-06-02 MED ORDER — ASPIRIN 81 MG PO CHEW
81.0000 mg | CHEWABLE_TABLET | Freq: Every day | ORAL | Status: DC
  Administered 2015-06-02 – 2015-06-08 (×7): 81 mg via ORAL
  Filled 2015-06-02 (×7): qty 1

## 2015-06-02 MED ORDER — PIPERACILLIN-TAZOBACTAM 3.375 G IVPB 30 MIN
3.3750 g | Freq: Once | INTRAVENOUS | Status: AC
  Administered 2015-06-02: 3.375 g via INTRAVENOUS
  Filled 2015-06-02: qty 50

## 2015-06-02 MED ORDER — HYDROMORPHONE 1 MG/ML IV SOLN
INTRAVENOUS | Status: DC
  Administered 2015-06-02: 23:00:00 via INTRAVENOUS
  Administered 2015-06-03: 10.8 mg via INTRAVENOUS
  Administered 2015-06-03: 11.52 mg via INTRAVENOUS
  Administered 2015-06-03: 12.08 mg via INTRAVENOUS
  Filled 2015-06-02 (×2): qty 25

## 2015-06-02 MED ORDER — CEFEPIME HCL 2 G IJ SOLR
2.0000 g | Freq: Three times a day (TID) | INTRAMUSCULAR | Status: DC
  Administered 2015-06-03 (×3): 2 g via INTRAVENOUS
  Filled 2015-06-02 (×4): qty 2

## 2015-06-02 MED ORDER — FOLIC ACID 1 MG PO TABS
1.0000 mg | ORAL_TABLET | Freq: Every morning | ORAL | Status: DC
  Administered 2015-06-03 – 2015-06-08 (×6): 1 mg via ORAL
  Filled 2015-06-02 (×6): qty 1

## 2015-06-02 MED ORDER — CYCLOBENZAPRINE HCL 5 MG PO TABS: 5.0000 mg | ORAL_TABLET | Freq: Three times a day (TID) | ORAL | Status: DC | PRN

## 2015-06-02 MED ORDER — POTASSIUM CHLORIDE CRYS ER 20 MEQ PO TBCR
20.0000 meq | EXTENDED_RELEASE_TABLET | Freq: Every day | ORAL | Status: DC
  Administered 2015-06-02 – 2015-06-08 (×7): 20 meq via ORAL
  Filled 2015-06-02 (×7): qty 1

## 2015-06-02 MED ORDER — ZOLPIDEM TARTRATE 10 MG PO TABS
10.0000 mg | ORAL_TABLET | Freq: Every evening | ORAL | Status: DC | PRN
  Administered 2015-06-02 – 2015-06-08 (×5): 10 mg via ORAL
  Filled 2015-06-02 (×5): qty 1

## 2015-06-02 MED ORDER — VITAMIN D 1000 UNITS PO TABS
2000.0000 [IU] | ORAL_TABLET | Freq: Every day | ORAL | Status: DC
  Administered 2015-06-03 – 2015-06-08 (×6): 2000 [IU] via ORAL
  Filled 2015-06-02 (×6): qty 2

## 2015-06-02 NOTE — ED Notes (Signed)
Pt presents with c/o left rib cage pain. Pt reports this feels like his normal sickle cell pain. Pt ambulatory to triage, seen here yesterday.

## 2015-06-02 NOTE — ED Provider Notes (Signed)
CSN: [id number]     Arrival date & time 06/02/15  1744 History   First MD Initiated Contact with Patient 06/02/15 1759     Chief Complaint  Patient presents with  . Sickle Cell Pain Crisis     (Consider location/radiation/quality/duration/timing/severity/associated sxs/prior Treatment) The history is provided by the patient.  [name] is a 35 y.o. male hx of sickle cell, acute chest, PE on lovenox, Here with chest pain, trunk of breath, flank pain. He was actually seen here frequently and has been admitted multiple times in the past and most recently was discharged yesterday. He states that today, he woke up and had worsening shoulder breath. He also had some chest pain as well. Moreover he had some back pain and flank pain. Denies any leg pain. He was noted to be hypoxic 86% in triage and low grade temp 100.1 F in triage. He is taking lovenox daily. Last time he had acute chest was several years ago.    Past Medical History  Diagnosis Date  . Sickle cell anemia (HCC)   . Blood transfusion   . Acute embolism and thrombosis of right internal jugular vein ([place])   . Hypokalemia   . Mood disorder ([place])   . History of pulmonary embolus (PE)   . Avascular necrosis (HCC)     Right Hip  . Leukocytosis     Chronic  . Thrombocytosis (HCC)     Chronic  . Hypertension   . History of Clostridium difficile infection   . Uses marijuana   . Chronic anticoagulation   . Functional asplenia   . Former smoker   . Second hand tobacco smoke exposure   . Alcohol consumption of one to four drinks per day   . Noncompliance with medication regimen   . Sickle-cell crisis with associated acute chest syndrome ([place]) 05/13/2013  . Acute chest syndrome ([place]) 06/18/2013  . Demand ischemia ([place]) 01/02/2014  . Pulmonary hypertension ([place])   . Hb-SS disease with crisis ([place])   . Oxygen deficiency    Past Surgical History  Procedure Laterality Date  . Right hip replacement      08/2006  .  Cholecystectomy      01/2008  . Porta cath placement    . Porta cath removal    . Umbilical hernia repair      01/2008  . Excision of left periauricular cyst      10/2009  . Excision of right ear lobe cyst with primary closur      11/2007  . Portacath placement  01/05/2012    Procedure: INSERTION PORT-A-CATH;  Surgeon: Odis Hollingshead, MD;  Location: [facility];  Service: General;  Laterality: N/A;  ultrasound guiced port a cath insertion with fluoroscopy   Family History  Problem Relation Age of Onset  . Sickle cell trait Mother   . Depression Mother   . Diabetes Mother   . Sickle cell trait Father   . Sickle cell trait Brother    Social History  Substance Use Topics  . Smoking status: Former Smoker -- 0.50 packs/day for 10 years    Types: Cigarettes    Quit date: 05/29/2011  . Smokeless tobacco: Never Used  . Alcohol Use: No    Review of Systems  Respiratory: Positive for shortness of breath.   Musculoskeletal:       Flank pain   All other systems reviewed and are negative.     Allergies  Review of patient's allergies indicates no  known allergies.  Home Medications   Prior to Admission medications   Medication Sig Start Date End Date Taking? Authorizing Provider  aspirin 81 MG chewable tablet Chew 1 tablet (81 mg total) by mouth daily. 07/23/14  Yes [name], MD  Cholecalciferol (VITAMIN D) 2000 units tablet Take 1 tablet (2,000 Units total) by mouth daily. 02/23/15  Yes Dorena Dew, FNP  cyclobenzaprine (FLEXERIL) 5 MG tablet Take 1 tablet (5 mg total) by mouth 3 (three) times daily as needed for muscle spasms. 02/19/15  Yes Alfonzo Beers, MD  enoxaparin (LOVENOX) 120 MG/0.8ML injection Inject 0.8 mLs (120 mg total) into the skin daily. 12/03/14  Yes [name], MD  folic acid (FOLVITE) 1 MG tablet Take 1 tablet (1 mg total) by mouth every morning. 02/23/15  Yes Dorena Dew, FNP  gabapentin (NEURONTIN) 300 MG capsule Take 1 capsule (300 mg  total) by mouth 3 (three) times daily. 06/01/15  Yes Tresa Garter, MD  HYDROmorphone (DILAUDID) 4 MG tablet Take 1 tablet (4 mg total) by mouth every 4 (four) hours as needed for severe pain. 06/01/15  Yes Tresa Garter, MD  hydroxyurea (HYDREA) 500 MG capsule Take 2 capsules (1,000 mg total) by mouth daily. May take with food to minimize GI side effects. 06/01/15  Yes Tresa Garter, MD  lisinopril (PRINIVIL,ZESTRIL) 10 MG tablet Take 1 tablet (10 mg total) by mouth daily. 04/13/15  Yes Dorena Dew, FNP  metoprolol succinate (TOPROL-XL) 50 MG 24 hr tablet Take 1 tablet (50 mg total) by mouth daily. 02/23/15  Yes Dorena Dew, FNP  morphine (MS CONTIN) 30 MG 12 hr tablet Take 1 tablet (30 mg total) by mouth every 12 (twelve) hours. 06/01/15  Yes Tresa Garter, MD  potassium chloride SA (K-DUR,KLOR-CON) 20 MEQ tablet Take 1 tablet (20 mEq total) by mouth every morning. Patient taking differently: Take 20 mEq by mouth daily.  04/13/15  Yes Dorena Dew, FNP  zolpidem (AMBIEN) 10 MG tablet Take 1 tablet (10 mg total) by mouth at bedtime as needed for sleep. 06/01/15  Yes Olugbemiga E Jegede, MD   BP 100/67 mmHg  Pulse 89  Temp(Src) 100 F (37.8 C) (Oral)  Resp 19  SpO2 95% Physical Exam  Constitutional: He is oriented to person, place, and time.  Chronically ill   HENT:  Head: Normocephalic.  Mouth/Throat: Oropharynx is clear and moist.  Eyes: Conjunctivae are normal. Pupils are equal, round, and reactive to light.  Neck: Normal range of motion. Neck supple.  Cardiovascular: Normal rate, regular rhythm and normal heart sounds.   Pulmonary/Chest: Effort normal and breath sounds normal. No respiratory distress. He has no wheezes. He has no rales.  Abdominal: Soft. Bowel sounds are normal.  No obvious CVAT   Musculoskeletal:  paralumbar tenderness, no obvious midline spinal tenderness   Neurological: He is alert and oriented to person, place, and time. No cranial nerve  deficit. Coordination normal.  Skin: Skin is warm and dry.  Psychiatric: He has a normal mood and affect. His behavior is normal. Judgment and thought content normal.  Nursing note and vitals reviewed.   ED Course  Procedures (including critical care time)  CRITICAL CARE Performed by: Darl Householder, Latresa Gasser   Total critical care time: 30 minutes  Critical care time was exclusive of separately billable procedures and treating other patients.  Critical care was necessary to treat or prevent imminent or life-threatening deterioration.  Critical care was time spent personally by  me on the following activities: development of treatment plan with patient and/or surrogate as well as nursing, discussions with consultants, evaluation of patient's response to treatment, examination of patient, obtaining history from patient or surrogate, ordering and performing treatments and interventions, ordering and review of laboratory studies, ordering and review of radiographic studies, pulse oximetry and re-evaluation of patient's condition.   Labs Review Labs Reviewed  CBC WITH DIFFERENTIAL/PLATELET - Abnormal; Notable for the following:    WBC 17.0 (*)    RBC 1.99 (*)    Hemoglobin 6.1 (*)    HCT 17.2 (*)    RDW 22.8 (*)    Platelets 528 (*)    Neutro Abs 11.6 (*)    Monocytes Absolute 2.2 (*)    Basophils Absolute 0.2 (*)    All other components within normal limits  COMPREHENSIVE METABOLIC PANEL - Abnormal; Notable for the following:    CO2 19 (*)    Glucose, Bld 124 (*)    Calcium 8.6 (*)    Total Bilirubin 4.7 (*)    All other components within normal limits  RETICULOCYTES - Abnormal; Notable for the following:    Retic Ct Pct 8.4 (*)    RBC. 2.01 (*)    All other components within normal limits  CULTURE, BLOOD (ROUTINE X 2)  CULTURE, BLOOD (ROUTINE X 2)  URINE CULTURE  URINALYSIS, ROUTINE W REFLEX MICROSCOPIC (NOT AT [facility])  I-STAT TROPOININ, ED  I-STAT CG4 LACTIC ACID, ED  TYPE AND SCREEN     Imaging Review Dg Chest 2 View  06/02/2015  CLINICAL DATA:  Sickle cell anemia. Left-sided chest pain for 1 day. EXAM: CHEST  2 VIEW COMPARISON:  06/01/2015 FINDINGS: There is an enlarged cardiac silhouette, stable from 06/01/2015. No pleural effusions. No airspace consolidation. Mild vascular prominence, unchanged. Airway is unremarkable. There is a left subclavian Port-A-Cath with tip at the cavoatrial junction. IMPRESSION: Enlarged cardiac silhouette, stable. No interval change from 06/01/2015. Electronically Signed   By: Andreas Newport M.D.   On: 06/02/2015 18:47   Dg Chest 2 View  06/01/2015  CLINICAL DATA:  Rib cage pain and mid back pain. Sickle cell crisis. EXAM: CHEST  2 VIEW COMPARISON:  05/07/2015 FINDINGS: Chronic cardiomegaly. Pulmonary vascularity is normal. No infiltrates or effusions. Power port in place. No bone abnormality. IMPRESSION: No acute abnormality.  Chronic cardiomegaly. Electronically Signed   By: Lorriane Shire M.D.   On: 06/01/2015 10:08   I have personally reviewed and evaluated these images and lab results as part of my medical decision-making.   EKG Interpretation   Date/Time:  Tuesday Jun 02 2015 18:08:36 EDT Ventricular Rate:  95 PR Interval:  182 QRS Duration: 114 QT Interval:  382 QTC Calculation: 480 R Axis:   -83 Text Interpretation:  Sinus rhythm Probable left atrial enlargement  Borderline IVCD with LAD Consider right ventricular hypertrophy Inferior  infarct, old Abnormal lateral Q waves Abnrm T, consider ischemia,  anterolateral lds worsening ST depressions and TWI  Confirmed by [place]  MD,  Gillermo Poch ([id number]) on 06/02/2015 6:30:20 PM      MDM   Final diagnoses:  None    CALEB KRENKE is a 35 y.o. male here with back pain, shortness of breath, hypoxia, low grade temp. Concerned for possible acute chest. He is already on lovenox for PE. Will get labs, CXR, trop, cultures. BP slightly low in the mid 90, will give IVF. Consider sepsis as well  so will do sepsis workup.   8:18 PM  BP improved to low 100s with IVF. Still has pain. CXR clear but hypoxia seems worse. Wonder if he has early acute chest syndrome. Given low grade temp, worsening hypoxia, will start on vanc/zosyn empirically. Hg 6.1, was 6.6 yesterday. Discussed with Dr. Hal Hope, hospitalist, who wants to hold off on transfusion for now. He is on lovenox so will continue that.    Wandra Arthurs, MD 06/02/15 2019

## 2015-06-02 NOTE — H&P (Signed)
History and Physical    [name] [id number] DOB: 1980/05/15 DOA: 06/02/2015  Referring MD/NP/PA: Dr. Darl Householder. PCP: [name], MD  Outpatient Specialists: None. Patient coming from: Home.  Chief Complaint: Back pain and rib pain.  HPI: [name] is a 35 y.o. male with medical history significant of history of sickle cell anemia, pulmonary hypertension, chronic respiratory failure, history of PE and hypertension presented to the ER because of increasing pain in his mid back with left upper quadrant pain and rib pain over the last 2 days which has been gradually worsening. Denies any nausea vomiting currently, fever chills any headaches focal deficits. The patient is found to be mildly febrile and has hemoglobin is at the baseline. White blood cell count is more than usual. Patient has been admitted for sickle cell pain crisis. Chest x-ray does not show any acute infiltrates.   ED Course: Patient has missed today's on pain medication and empiric antibiotics since patient had mild fever with leukocytosis and hypoxia. Patient is usually chronically hypoxic and uses home oxygen at bedtime.  Review of Systems: As per HPI otherwise 10 point review of systems negative.    Past Medical History  Diagnosis Date  . Sickle cell anemia (HCC)   . Blood transfusion   . Acute embolism and thrombosis of right internal jugular vein ([place])   . Hypokalemia   . Mood disorder ([place])   . History of pulmonary embolus (PE)   . Avascular necrosis (HCC)     Right Hip  . Leukocytosis     Chronic  . Thrombocytosis (HCC)     Chronic  . Hypertension   . History of Clostridium difficile infection   . Uses marijuana   . Chronic anticoagulation   . Functional asplenia   . Former smoker   . Second hand tobacco smoke exposure   . Alcohol consumption of one to four drinks per day   . Noncompliance with medication regimen   . Sickle-cell crisis with associated acute chest syndrome ([place])  05/13/2013  . Acute chest syndrome ([place]) 06/18/2013  . Demand ischemia ([place]) 01/02/2014  . Pulmonary hypertension ([place])   . Hb-SS disease with crisis ([place])   . Oxygen deficiency     Past Surgical History  Procedure Laterality Date  . Right hip replacement      08/2006  . Cholecystectomy      01/2008  . Porta cath placement    . Porta cath removal    . Umbilical hernia repair      01/2008  . Excision of left periauricular cyst      10/2009  . Excision of right ear lobe cyst with primary closur      11/2007  . Portacath placement  01/05/2012    Procedure: INSERTION PORT-A-CATH;  Surgeon: Odis Hollingshead, MD;  Location: [facility];  Service: General;  Laterality: N/A;  ultrasound guiced port a cath insertion with fluoroscopy     reports that he quit smoking about 4 years ago. His smoking use included Cigarettes. He has a 5 pack-year smoking history. He has never used smokeless tobacco. He reports that he does not drink alcohol or use illicit drugs.  No Known Allergies  Family History  Problem Relation Age of Onset  . Sickle cell trait Mother   . Depression Mother   . Diabetes Mother   . Sickle cell trait Father   . Sickle cell trait Brother     Prior to Admission medications  Medication Sig Start Date End Date Taking? Authorizing Provider  aspirin 81 MG chewable tablet Chew 1 tablet (81 mg total) by mouth daily. 07/23/14  Yes [name], MD  Cholecalciferol (VITAMIN D) 2000 units tablet Take 1 tablet (2,000 Units total) by mouth daily. 02/23/15  Yes Dorena Dew, FNP  cyclobenzaprine (FLEXERIL) 5 MG tablet Take 1 tablet (5 mg total) by mouth 3 (three) times daily as needed for muscle spasms. 02/19/15  Yes Alfonzo Beers, MD  enoxaparin (LOVENOX) 120 MG/0.8ML injection Inject 0.8 mLs (120 mg total) into the skin daily. 12/03/14  Yes [name], MD  folic acid (FOLVITE) 1 MG tablet Take 1 tablet (1 mg total) by mouth every morning. 02/23/15  Yes Dorena Dew, FNP    gabapentin (NEURONTIN) 300 MG capsule Take 1 capsule (300 mg total) by mouth 3 (three) times daily. 06/01/15  Yes Tresa Garter, MD  HYDROmorphone (DILAUDID) 4 MG tablet Take 1 tablet (4 mg total) by mouth every 4 (four) hours as needed for severe pain. 06/01/15  Yes Tresa Garter, MD  hydroxyurea (HYDREA) 500 MG capsule Take 2 capsules (1,000 mg total) by mouth daily. May take with food to minimize GI side effects. 06/01/15  Yes Tresa Garter, MD  lisinopril (PRINIVIL,ZESTRIL) 10 MG tablet Take 1 tablet (10 mg total) by mouth daily. 04/13/15  Yes Dorena Dew, FNP  metoprolol succinate (TOPROL-XL) 50 MG 24 hr tablet Take 1 tablet (50 mg total) by mouth daily. 02/23/15  Yes Dorena Dew, FNP  morphine (MS CONTIN) 30 MG 12 hr tablet Take 1 tablet (30 mg total) by mouth every 12 (twelve) hours. 06/01/15  Yes Tresa Garter, MD  potassium chloride SA (K-DUR,KLOR-CON) 20 MEQ tablet Take 1 tablet (20 mEq total) by mouth every morning. Patient taking differently: Take 20 mEq by mouth daily.  04/13/15  Yes Dorena Dew, FNP  zolpidem (AMBIEN) 10 MG tablet Take 1 tablet (10 mg total) by mouth at bedtime as needed for sleep. 06/01/15  Yes Tresa Garter, MD    Physical Exam: Filed Vitals:   06/02/15 1754 06/02/15 1858 06/02/15 1942 06/02/15 2146  BP: 96/60 98/64 100/67 121/71  Pulse: 97 99 89 87  Temp: 100.1 F (37.8 C) 100 F (37.8 C)  99.1 F (37.3 C)  TempSrc: Oral Oral  Oral  Resp: 18 16 19 18   Height:    6' (1.829 m)  Weight:    160 lb 12.8 oz (72.938 kg)  SpO2: 86% 92% 95% 95%      Constitutional: Appears normal. Filed Vitals:   06/02/15 1754 06/02/15 1858 06/02/15 1942 06/02/15 2146  BP: 96/60 98/64 100/67 121/71  Pulse: 97 99 89 87  Temp: 100.1 F (37.8 C) 100 F (37.8 C)  99.1 F (37.3 C)  TempSrc: Oral Oral  Oral  Resp: 18 16 19 18   Height:    6' (1.829 m)  Weight:    160 lb 12.8 oz (72.938 kg)  SpO2: 86% 92% 95% 95%   Eyes: Anicteric mild  pallor. ENMT: No discharge from the ears eyes nose or mouth. Neck: No mass felt. Respiratory: No rhonchi or crepitations. Cardiovascular: S1 and S2 heard. Abdomen: Soft tenderness in the left upper quadrant. No guarding or rigidity. Musculoskeletal: No edema. No joint swellings. Skin: Chronic skin scarring. Neurologic: Alert awake oriented to time place and person. Moves all extremities. Psychiatric: Appears normal.   Labs on Admission: I have personally reviewed following labs and  imaging studies  CBC:  Recent Labs Lab 05/27/15 0922 05/31/15 0750 06/01/15 1111 06/02/15 1833  WBC 15.1* 16.9* 15.0* 17.0*  NEUTROABS 11.4* 12.1* 10.8* 11.6*  HGB 7.1* 6.6* 6.6* 6.1*  HCT 20.5* 19.2* 18.9* 17.2*  MCV 88.4 88.5 87.9 86.4  PLT 532* 528* 497* 0000000*   Basic Metabolic Panel:  Recent Labs Lab 05/27/15 0922 05/31/15 0750 06/01/15 1111 06/02/15 1836  NA 136 138 138 136  K 4.3 4.3 4.4 4.3  CL 106 110 111 110  CO2 21* 19* 18* 19*  GLUCOSE 126* 108* 99 124*  BUN 14 15 13 18   CREATININE 0.85 0.75 0.76 1.02  CALCIUM 9.0 9.2 9.0 8.6*   GFR: Estimated Creatinine Clearance: 103.2 mL/min (by C-G formula based on Cr of 1.02). Liver Function Tests:  Recent Labs Lab 05/27/15 0922 05/31/15 0750 06/01/15 1111 06/02/15 1836  AST 45* 36 38 41  ALT 25 22 20 19   ALKPHOS 88 72 71 70  BILITOT 6.6* 4.6* 5.5* 4.7*  PROT 8.0 7.6 7.8 7.3  ALBUMIN 4.4 4.3 4.5 4.1   No results for input(s): LIPASE, AMYLASE in the last 168 hours. No results for input(s): AMMONIA in the last 168 hours. Coagulation Profile: No results for input(s): INR, PROTIME in the last 168 hours. Cardiac Enzymes: No results for input(s): CKTOTAL, CKMB, CKMBINDEX, TROPONINI in the last 168 hours. BNP (last 3 results) No results for input(s): PROBNP in the last 8760 hours. HbA1C: No results for input(s): HGBA1C in the last 72 hours. CBG: No results for input(s): GLUCAP in the last 168 hours. Lipid Profile: No  results for input(s): CHOL, HDL, LDLCALC, TRIG, CHOLHDL, LDLDIRECT in the last 72 hours. Thyroid Function Tests: No results for input(s): TSH, T4TOTAL, FREET4, T3FREE, THYROIDAB in the last 72 hours. Anemia Panel:  Recent Labs  05/31/15 0750 06/02/15 1836  RETICCTPCT 11.2* 8.4*   Urine analysis:    Component Value Date/Time   COLORURINE YELLOW 06/02/2015 1941   APPEARANCEUR CLEAR 06/02/2015 1941   LABSPEC 1.011 06/02/2015 1941   PHURINE 6.5 06/02/2015 1941   GLUCOSEU NEGATIVE 06/02/2015 1941   HGBUR NEGATIVE 06/02/2015 1941   BILIRUBINUR NEGATIVE 06/02/2015 1941   KETONESUR NEGATIVE 06/02/2015 1941   PROTEINUR NEGATIVE 06/02/2015 1941   UROBILINOGEN 0.2 03/24/2015 1426   NITRITE NEGATIVE 06/02/2015 1941   LEUKOCYTESUR NEGATIVE 06/02/2015 1941   Sepsis Labs: @LABRCNTIP (procalcitonin:4,lacticidven:4) )No results found for this or any previous visit (from the past 240 hour(s)).   Radiological Exams on Admission: Dg Chest 2 View  06/02/2015  CLINICAL DATA:  Sickle cell anemia. Left-sided chest pain for 1 day. EXAM: CHEST  2 VIEW COMPARISON:  06/01/2015 FINDINGS: There is an enlarged cardiac silhouette, stable from 06/01/2015. No pleural effusions. No airspace consolidation. Mild vascular prominence, unchanged. Airway is unremarkable. There is a left subclavian Port-A-Cath with tip at the cavoatrial junction. IMPRESSION: Enlarged cardiac silhouette, stable. No interval change from 06/01/2015. Electronically Signed   By: Andreas Newport M.D.   On: 06/02/2015 18:47   Dg Chest 2 View  06/01/2015  CLINICAL DATA:  Rib cage pain and mid back pain. Sickle cell crisis. EXAM: CHEST  2 VIEW COMPARISON:  05/07/2015 FINDINGS: Chronic cardiomegaly. Pulmonary vascularity is normal. No infiltrates or effusions. Power port in place. No bone abnormality. IMPRESSION: No acute abnormality.  Chronic cardiomegaly. Electronically Signed   By: Lorriane Shire M.D.   On: 06/01/2015 10:08      Assessment/Plan Principal Problem:   Sickle cell anemia with crisis [facility]) Active Problems:  Hx of pulmonary embolus   Pulmonary HTN (HCC)   Sickle cell pain crisis ([place])   Fever    #1. Sickle cell anemia with pain crisis - at this time patient has been placed on weight-based Dilaudid PCA pump and continue his chronic pain medications. Since patient's hemoglobin is around 6 at his baseline for no molding of hydroxyurea until seen by sickle cell team in the morning. Since patient also was mildly febrile and had more than usual leukocytosis blood cultures were obtained and the results are available patient will be on empiric antibiotics to cover for possible chest syndrome. Check LDH. And on exam patient has significant left upper quadrant tenderness for which CT abdomen and pelvis has been ordered. #2. History of PE - patient states he has not missed his dose of Lovenox which will be continued. #3. History of hypertension on metoprolol and lisinopril. #4. Chronic respiratory failure with hypoxia with pulmonary hypertension on home oxygen at bedtime.  Repeat EKG is pending.   DVT prophylaxis: Lovenox. Code Status: Full code.  Family Communication: No family at the bedside.  Disposition Plan: Home.  Consults called: None.  Admission status: Inpatient. Likely stage 3-4 days.    Rise Patience MD [facility] Pager [phone].  If 7PM-7AM, please contact night-coverage [email or web address] Password [email or web address]  06/02/2015, 10:03 PM

## 2015-06-02 NOTE — ED Notes (Signed)
Hemoglobin 6.1 critical result from called from lab.

## 2015-06-02 NOTE — ED Notes (Signed)
Patient aware of needed urine sample. Patient will call out when ready

## 2015-06-02 NOTE — ED Notes (Signed)
20 min timer started.  

## 2015-06-02 NOTE — ED Notes (Signed)
RN sts he will draw pt labs.

## 2015-06-03 DIAGNOSIS — Z7901 Long term (current) use of anticoagulants: Secondary | ICD-10-CM

## 2015-06-03 DIAGNOSIS — R509 Fever, unspecified: Secondary | ICD-10-CM

## 2015-06-03 DIAGNOSIS — R1900 Intra-abdominal and pelvic swelling, mass and lump, unspecified site: Secondary | ICD-10-CM

## 2015-06-03 DIAGNOSIS — D638 Anemia in other chronic diseases classified elsewhere: Secondary | ICD-10-CM

## 2015-06-03 DIAGNOSIS — D5700 Hb-SS disease with crisis, unspecified: Principal | ICD-10-CM

## 2015-06-03 LAB — CBC WITH DIFFERENTIAL/PLATELET
BASOS ABS: 0 10*3/uL (ref 0.0–0.1)
BASOS PCT: 0 %
EOS ABS: 0.4 10*3/uL (ref 0.0–0.7)
Eosinophils Relative: 2 %
HCT: 18.6 % — ABNORMAL LOW (ref 39.0–52.0)
HEMOGLOBIN: 6.5 g/dL — AB (ref 13.0–17.0)
Lymphocytes Relative: 23 %
Lymphs Abs: 4.7 10*3/uL — ABNORMAL HIGH (ref 0.7–4.0)
MCH: 31 pg (ref 26.0–34.0)
MCHC: 34.9 g/dL (ref 30.0–36.0)
MCV: 88.6 fL (ref 78.0–100.0)
MONO ABS: 2.7 10*3/uL — AB (ref 0.1–1.0)
MONOS PCT: 13 %
NEUTROS PCT: 62 %
NRBC: 1 /100{WBCs} — AB
Neutro Abs: 12.6 10*3/uL — ABNORMAL HIGH (ref 1.7–7.7)
PLATELETS: 544 10*3/uL — AB (ref 150–400)
RBC: 2.1 MIL/uL — ABNORMAL LOW (ref 4.22–5.81)
RDW: 22.9 % — ABNORMAL HIGH (ref 11.5–15.5)
WBC: 20.4 10*3/uL — ABNORMAL HIGH (ref 4.0–10.5)

## 2015-06-03 LAB — BASIC METABOLIC PANEL
ANION GAP: 8 (ref 5–15)
BUN: 10 mg/dL (ref 6–20)
CHLORIDE: 110 mmol/L (ref 101–111)
CO2: 19 mmol/L — ABNORMAL LOW (ref 22–32)
Calcium: 8.9 mg/dL (ref 8.9–10.3)
Creatinine, Ser: 0.82 mg/dL (ref 0.61–1.24)
GFR calc Af Amer: >60 mL/min (ref >60)
Glucose, Bld: 115 mg/dL — ABNORMAL HIGH (ref 65–99)
POTASSIUM: 4.3 mmol/L (ref 3.5–5.1)
SODIUM: 137 mmol/L (ref 135–145)

## 2015-06-03 LAB — RETICULOCYTES
RBC.: 2.07 MIL/uL — ABNORMAL LOW (ref 4.22–5.81)
RETIC COUNT ABSOLUTE: 167.7 10*3/uL (ref 19.0–186.0)
RETIC CT PCT: 8.1 % — AB (ref 0.4–3.1)

## 2015-06-03 MED ORDER — HYDROMORPHONE HCL 2 MG/ML IJ SOLN
2.0000 mg | INTRAMUSCULAR | Status: AC
  Administered 2015-06-03 – 2015-06-04 (×8): 2 mg via INTRAVENOUS
  Filled 2015-06-03 (×8): qty 1

## 2015-06-03 MED ORDER — HYDROMORPHONE 1 MG/ML IV SOLN
INTRAVENOUS | Status: DC
  Administered 2015-06-03: 7.2 mg via INTRAVENOUS
  Administered 2015-06-03: 4.32 mg via INTRAVENOUS
  Administered 2015-06-04: 3.6 mg via INTRAVENOUS
  Administered 2015-06-04: 3 mg via INTRAVENOUS
  Administered 2015-06-04: 6.6 mg via INTRAVENOUS
  Administered 2015-06-04: 3 mg via INTRAVENOUS
  Administered 2015-06-04: 18:00:00 via INTRAVENOUS
  Administered 2015-06-04: 8.4 mg via INTRAVENOUS
  Administered 2015-06-04: 03:00:00 via INTRAVENOUS
  Administered 2015-06-04: 7.2 mg via INTRAVENOUS
  Administered 2015-06-05: 3 mg via INTRAVENOUS
  Administered 2015-06-05: 4.19 mg via INTRAVENOUS
  Administered 2015-06-05: 0.6 mg via INTRAVENOUS
  Administered 2015-06-05: 16:00:00 via INTRAVENOUS
  Administered 2015-06-05: 6.6 mg via INTRAVENOUS
  Administered 2015-06-05: 7.2 mg via INTRAVENOUS
  Administered 2015-06-05: 6 mg via INTRAVENOUS
  Administered 2015-06-05: 7.8 mg via INTRAVENOUS
  Administered 2015-06-06: 11.4 mg via INTRAVENOUS
  Administered 2015-06-06 (×2): 1.8 mg via INTRAVENOUS
  Administered 2015-06-06: 7.22 mg via INTRAVENOUS
  Administered 2015-06-06: 10.77 mg via INTRAVENOUS
  Administered 2015-06-07: 4.8 mg via INTRAVENOUS
  Administered 2015-06-07: 12.6 mg via INTRAVENOUS
  Administered 2015-06-07: 6.48 mg via INTRAVENOUS
  Administered 2015-06-07: 12.6 mg via INTRAVENOUS
  Administered 2015-06-07 (×2): via INTRAVENOUS
  Administered 2015-06-07: 3 mg via INTRAVENOUS
  Administered 2015-06-08: 1.8 mg via INTRAVENOUS
  Administered 2015-06-08: 13.8 mg via INTRAVENOUS
  Administered 2015-06-08: 10.8 mg via INTRAVENOUS
  Administered 2015-06-08: via INTRAVENOUS
  Filled 2015-06-03 (×7): qty 25

## 2015-06-03 MED ORDER — KETOROLAC TROMETHAMINE 30 MG/ML IJ SOLN
30.0000 mg | Freq: Four times a day (QID) | INTRAMUSCULAR | Status: AC
  Administered 2015-06-03 – 2015-06-08 (×19): 30 mg via INTRAVENOUS
  Filled 2015-06-03 (×20): qty 1

## 2015-06-03 MED ORDER — DIATRIZOATE MEGLUMINE & SODIUM 66-10 % PO SOLN
15.0000 mL | Freq: Once | ORAL | Status: AC
  Administered 2015-06-02: 15 mL via ORAL

## 2015-06-03 MED ORDER — VANCOMYCIN HCL IN DEXTROSE 1-5 GM/200ML-% IV SOLN
1000.0000 mg | Freq: Three times a day (TID) | INTRAVENOUS | Status: DC
  Administered 2015-06-03: 1000 mg via INTRAVENOUS
  Filled 2015-06-03 (×2): qty 200

## 2015-06-03 MED ORDER — HYDROMORPHONE HCL 2 MG/ML IJ SOLN
2.0000 mg | INTRAMUSCULAR | Status: DC | PRN
  Administered 2015-06-04 (×2): 2 mg via INTRAVENOUS
  Filled 2015-06-03 (×2): qty 1

## 2015-06-03 MED ORDER — DIPHENHYDRAMINE HCL 25 MG PO CAPS: 25.0000 mg | ORAL_CAPSULE | Freq: Four times a day (QID) | ORAL | Status: DC | PRN

## 2015-06-03 MED ORDER — IOPAMIDOL (ISOVUE-300) INJECTION 61%
100.0000 mL | Freq: Once | INTRAVENOUS | Status: AC | PRN
  Administered 2015-06-03: 100 mL via INTRAVENOUS

## 2015-06-03 MED ORDER — SODIUM CHLORIDE 0.9 % IV SOLN
25.0000 mg | INTRAVENOUS | Status: DC | PRN
  Administered 2015-06-04 – 2015-06-06 (×3): 25 mg via INTRAVENOUS
  Filled 2015-06-03 (×7): qty 0.5

## 2015-06-03 NOTE — Progress Notes (Signed)
Report called to [facility] on 3rd floor. Pt will be transferred to room 1342. Pt aware.

## 2015-06-03 NOTE — Care Management Note (Signed)
Case Management Note  Patient Details  Name: [name] MRN: [id number] Date of Birth: 1980-09-20  Subjective/Objective: 35 y/o m admitted w/SSC. From home. Multiple readmissions/ed visits in past 6 months. Active w/[facility]-following. Active w/[facility] home 02 HS.                    Action/Plan:d/c plan home.   Expected Discharge Date:   (unknown)               Expected Discharge Plan:  Home/Self Care  In-House Referral:     Discharge planning Services  CM Consult  Post Acute Care Choice:  Durable Medical Equipment (Active w/[facility] home 02 HS) Choice offered to:     DME Arranged:    DME Agency:     HH Arranged:    HH Agency:     Status of Service:  In process, will continue to follow  Medicare Important Message Given:    Date Medicare IM Given:    Medicare IM give by:    Date Additional Medicare IM Given:    Additional Medicare Important Message give by:     If discussed at [place] of Stay Meetings, dates discussed:    Additional Comments:  Dessa Phi, RN 06/03/2015, 12:01 PM

## 2015-06-03 NOTE — Progress Notes (Addendum)
SICKLE CELL SERVICE PROGRESS NOTE  [name] [id number] DOB: Aug 25, 1980 DOA: 06/02/2015 PCP: [name], MD  Assessment/Plan: Principal Problem:   Sickle cell anemia with crisis ([place]) Active Problems:   Pulmonary HTN ([place])   Hx of pulmonary embolus   Sickle cell pain crisis ([place])   Fever  1. Hb SS with crisis: Pt rates pain at 8-9/10 and localized to ribs, back and legs. He also has tenderness in the LLQ which is unlike his usual pain associated with Sickle Cell Crisis I have adjusted the PCA to a lower bolus dose as the current dose is causing somnolence as reported by the patient. Add scheduled clinician doses of Dilaudid, Toradol and continue IVF.  2. LLQ abdominal pain: Pt has tenderness and a linear mass palpable in the right lower quadrant. There is no warmth or erythema over the area of tenderness. A CT scan of abdomen and pelvis was performed on admission but did not show any significant abnormalities however there appears to be some fluid in the abdominal wall. I have discussed this with radiology who feels that this could be related to area of Lovenox injections. However no frank hematoma.  Will treat locally with warm compress.  3. Leukocytosis: No evidence of infection. Likely related to bone marrow activity. Discontinued antibiotics.  4. Fever: Pt had a low grade fever in the ED last night. However no source of infection. He was started empirically on IV antibiotics. However I have discontinued them as no source of infection. Will observe.  5. Anemia of chronic disease: Hb currently at baseline. Expect some dilutional effect and decrease in Hb.  6. Chronic Respiratory Failure with Hypoxia: Currently at baseline. 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  7. Chronic pain: Continue MS Contin and Gabapentin.   Code Status: Full Code Family Communication: N/A Disposition Plan: Not yet ready for  discharge  [place].  Pager [phone]. If 7PM-7AM, please contact night-coverage.  06/03/2015, 2:44 PM  LOS: 1 day   Interim History: Pt reports pain as 8/10 and localized to ribs, back and legs.   Consultants:  None  Procedures:  None  Antibiotics:  Vancomycin 5/3>>5/3  Cefepime 5/3>>5/3   Objective: Filed Vitals:   06/03/15 0801 06/03/15 0902 06/03/15 1200 06/03/15 1327  BP:  122/62  124/80  Pulse:  94  99  Temp:    98.9 F (37.2 C)  TempSrc:    Oral  Resp: 21  24   Height:      Weight:      SpO2: 96%  91% 100%   Weight change:   Intake/Output Summary (Last 24 hours) at 06/03/15 1444 Last data filed at 06/03/15 1006  Gross per 24 hour  Intake 2256.66 ml  Output   1325 ml  Net 931.66 ml    General: Alert, awake, oriented x3, in mild distress secondary to pain.  HEENT: [place]/AT PEERL, EOMI, mild icterus Neck: Trachea midline,  no masses, no thyromegal,y no JVD, no carotid bruit OROPHARYNX:  Moist, No exudate/ erythema/lesions.  Heart: Regular rate and rhythm, without murmurs, rubs, gallops, PMI non-displaced, no heaves or thrills on palpation.  Lungs: Clear to auscultation, no wheezing or rhonchi noted. No increased vocal fremitus resonant to percussion  Abdomen: Soft,tenderness and superficial linear mass in RLQ, nondistended, positive bowel sounds, no masses no hepatosplenomegaly noted.  Neuro: No focal neurological deficits noted cranial nerves II through XII grossly intact.  Strength at functional baseline in bilateral upper and lower  extremities. Musculoskeletal: No warmth swelling or erythema around joints, no spinal tenderness noted. Psychiatric: Patient alert and oriented x3, good insight and cognition, good recent to remote recall.    Data Reviewed: Basic Metabolic Panel:  Recent Labs Lab 05/31/15 0750 06/01/15 1111 06/02/15 1836 06/03/15 0450  NA 138 138 136 137  K 4.3 4.4 4.3 4.3  CL 110 111 110 110  CO2 19* 18* 19* 19*   GLUCOSE 108* 99 124* 115*  BUN 15 13 18 10   CREATININE 0.75 0.76 1.02 0.82  CALCIUM 9.2 9.0 8.6* 8.9   Liver Function Tests:  Recent Labs Lab 05/31/15 0750 06/01/15 1111 06/02/15 1836  AST 36 38 41  ALT 22 20 19   ALKPHOS 72 71 70  BILITOT 4.6* 5.5* 4.7*  PROT 7.6 7.8 7.3  ALBUMIN 4.3 4.5 4.1   No results for input(s): LIPASE, AMYLASE in the last 168 hours. No results for input(s): AMMONIA in the last 168 hours. CBC:  Recent Labs Lab 05/31/15 0750 06/01/15 1111 06/02/15 1833 06/03/15 0450  WBC 16.9* 15.0* 17.0* 20.4*  NEUTROABS 12.1* 10.8* 11.6* 12.6*  HGB 6.6* 6.6* 6.1* 6.5*  HCT 19.2* 18.9* 17.2* 18.6*  MCV 88.5 87.9 86.4 88.6  PLT 528* 497* 528* 544*   Cardiac Enzymes:  Recent Labs Lab 06/02/15 2215  TROPONINI 0.05*   BNP (last 3 results)  Recent Labs  08/29/14 0810 09/10/14 0612 11/30/14 0910  BNP 1117.0* 549.5* 639.7*    ProBNP (last 3 results) No results for input(s): PROBNP in the last 8760 hours.  CBG: No results for input(s): GLUCAP in the last 168 hours.  Recent Results (from the past 240 hour(s))  Blood culture (routine x 2)     Status: None (Preliminary result)   Collection Time: 06/02/15  6:33 PM  Result Value Ref Range Status   Specimen Description BLOOD PORTA CATH  Final   Special Requests BOTTLES DRAWN AEROBIC AND ANAEROBIC 5CC EACH  Final   Culture PENDING  Incomplete   Report Status PENDING  Incomplete     Studies: Dg Chest 2 View  06/02/2015  CLINICAL DATA:  Sickle cell anemia. Left-sided chest pain for 1 day. EXAM: CHEST  2 VIEW COMPARISON:  06/01/2015 FINDINGS: There is an enlarged cardiac silhouette, stable from 06/01/2015. No pleural effusions. No airspace consolidation. Mild vascular prominence, unchanged. Airway is unremarkable. There is a left subclavian Port-A-Cath with tip at the cavoatrial junction. IMPRESSION: Enlarged cardiac silhouette, stable. No interval change from 06/01/2015. Electronically Signed   By: Andreas Newport M.D.   On: 06/02/2015 18:47   Dg Chest 2 View  06/01/2015  CLINICAL DATA:  Rib cage pain and mid back pain. Sickle cell crisis. EXAM: CHEST  2 VIEW COMPARISON:  05/07/2015 FINDINGS: Chronic cardiomegaly. Pulmonary vascularity is normal. No infiltrates or effusions. Power port in place. No bone abnormality. IMPRESSION: No acute abnormality.  Chronic cardiomegaly. Electronically Signed   By: Lorriane Shire M.D.   On: 06/01/2015 10:08   Dg Chest 2 View  05/07/2015  CLINICAL DATA:  Sickle cell pain crisis, chest pain EXAM: CHEST  2 VIEW COMPARISON:  04/26/2015 FINDINGS: Left subclavian power port catheter tip SVC RA junction. Stable cardiomegaly with vascular congestion and basilar atelectasis/ parenchymal scarring. No superimposed pneumonia, collapse or consolidation. No edema, effusion or pneumothorax. Trachea midline. IMPRESSION: Stable cardiomegaly with vascular congestion and parenchymal scarring. No superimposed acute process. Electronically Signed   By: Jerilynn Mages.  Shick M.D.   On: 05/07/2015 09:05   Ct Abdomen Pelvis  W Contrast  06/03/2015  CLINICAL DATA:  Fever flank pain and leukocytosis. EXAM: CT ABDOMEN AND PELVIS WITH CONTRAST TECHNIQUE: Multidetector CT imaging of the abdomen and pelvis was performed using the standard protocol following bolus administration of intravenous contrast. CONTRAST:  138mL ISOVUE-300 IOPAMIDOL (ISOVUE-300) INJECTION 61% COMPARISON:  06/01/2009 FINDINGS: Lower chest: Ventriculomegaly, unchanged. Chronic scarring in both lungs. Hepatobiliary: Cholecystectomy. No focal liver lesions. No bile duct dilatation. Pancreas: Normal Spleen: Small and calcified, unchanged. Adrenals/Urinary Tract: The adrenals and kidneys are normal in appearance. There is no urinary calculus evident. There is no hydronephrosis or ureteral dilatation. Collecting systems and ureters appear unremarkable. Stomach/Bowel: There are normal appearances of the stomach, small bowel and colon. The appendix is  normal. Vascular/Lymphatic: The abdominal aorta is normal in caliber. There is mild atherosclerotic calcification. There is no pathologically enlarged adenopathy in the abdomen or pelvis. There is a prominent number of small periaortic nodes, unchanged. Reproductive: Unremarkable Other: No acute inflammatory changes are evident in the abdomen or pelvis. There is no ascites. Musculoskeletal: Multiple bone infarcts. 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 Incidental umbilical hernia, nonobstructive. IMPRESSION: No acute findings are evident in the abdomen or pelvis. 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 Cardiomegaly. The findings are all typical of sickle cell disease. Electronically Signed   By: Andreas Newport M.D.   On: 06/03/2015 00:47    Scheduled Meds: . aspirin  81 mg Oral Daily  . cholecalciferol  2,000 Units Oral Daily  . enoxaparin  120 mg Subcutaneous Q24H  . folic acid  1 mg Oral q morning - 10a  . gabapentin  300 mg Oral TID  . HYDROmorphone   Intravenous Q4H  .  HYDROmorphone (DILAUDID) injection  2 mg Intravenous Q2H  . ketorolac  30 mg Intravenous Q6H  . lisinopril  10 mg Oral Daily  . metoprolol succinate  50 mg Oral Daily  . morphine  30 mg Oral Q12H  . potassium chloride SA  20 mEq Oral Daily  . senna-docusate  1 tablet Oral BID   Continuous Infusions: . sodium chloride 100 mL/hr at 06/03/15 1325    Time spent 25 minurtes

## 2015-06-03 NOTE — Progress Notes (Signed)
Pharmacy Antibiotic Note  [name] is a 35 y.o. male admitted on 06/02/2015 with pneumonia.  Pharmacy has been consulted for Vancomycin, cefepime  dosing.  Plan: Vancomycin 1000mg  IV every 8 hours.  Goal trough 15-20 mcg/mL.  Height: 6' (182.9 cm) Weight: 160 lb 12.8 oz (72.938 kg) IBW/kg (Calculated) : 77.6  Temp (24hrs), Avg:99.6 F (37.6 C), Min:99 F (37.2 C), Max:100.1 F (37.8 C)   Recent Labs Lab 05/27/15 0922 05/31/15 0750 06/01/15 1111 06/02/15 1833 06/02/15 1836 06/02/15 1841 06/02/15 2106 06/03/15 0450  WBC 15.1* 16.9* 15.0* 17.0*  --   --   --  20.4*  CREATININE 0.85 0.75 0.76  --  1.02  --   --  0.82  LATICACIDVEN  --   --   --   --   --  0.61 0.43*  --     Estimated Creatinine Clearance: 128.4 mL/min (by C-G formula based on Cr of 0.82).    No Known Allergies  Antimicrobials this admission: Vancomycin 5/2 >> Cefepime 5/3 >>   Dose adjustments this admission: -  Microbiology results: Pending  Thank you for allowing pharmacy to be a part of this patient's care.  [name] 06/03/2015 6:29 AM

## 2015-06-04 ENCOUNTER — Ambulatory Visit (HOSPITAL_COMMUNITY): Admission: RE | Admit: 2015-06-04 | Payer: Medicare Other | Source: Ambulatory Visit

## 2015-06-04 ENCOUNTER — Inpatient Hospital Stay (HOSPITAL_COMMUNITY): Payer: Medicare Other

## 2015-06-04 DIAGNOSIS — J9611 Chronic respiratory failure with hypoxia: Secondary | ICD-10-CM

## 2015-06-04 MED ORDER — HYDROMORPHONE HCL 2 MG/ML IJ SOLN
2.0000 mg | INTRAMUSCULAR | Status: DC | PRN
  Administered 2015-06-04 – 2015-06-07 (×27): 2 mg via INTRAVENOUS
  Filled 2015-06-04 (×28): qty 1

## 2015-06-04 NOTE — Consult Note (Signed)
   [facility] CM Inpatient Consult   06/04/2015  HEMINGWAY MCRILL 02-08-1980 [id number]   Attempts have been made on numerous occasions to engage Mr. Marlo for [place] Management services. Please see patient outreach attempts by [facility] under chart review tab then notes. Went to bedside to speak with patient. I asked him once again if he wanted to continue to be followed by [facility] as he has not responded to calls. He states "it is no need to continue to call me". Patient states he no longer wants [facility] Management services. Thereby, declining [facility] Care Management program. Left information for him to call if he wants to actively engage with program. Made inpatient RNCM aware of bedside conversation. Also discussed that it appears that sickle cell clinic visits are being counted as hospital admissions. Writer called [facility] Admitting department to confirm that this is indeed the case. Therefore, the 28 hospital admissions in the past 6 months are not necessarily inpatient hospital admissions that are reflected in the banner.   Marthenia Rolling, MSN-Ed, RN,BSN [facility] Liaison [phone]

## 2015-06-04 NOTE — Progress Notes (Signed)
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 Hb SS with crisis continue the PCA at current dosing, continue Toradol. I will increase the frequency of clinician assisted doses to every 2 hours as needed and continue IV fluids.  2. LLQ abdominal pain: Pt has last tenderness in the right lower quadrant today There is no warmth or erythema over the area of tenderness. Will continue to treat locally with warm compress.  3. Leukocytosis: No evidence of infection. Likely related to bone marrow activity. Discontinued antibiotics.  4. Fever: Pt had a low grade fever in the ED last night. However no source of infection. He was started empirically on IV antibiotics. However I have discontinued them as no source of infection. Will observe.  5. Anemia of chronic disease: Hb currently at baseline. Expect some dilutional effect and decrease in Hb.  6. Chronic Respiratory Failure with Hypoxia: Currently at baseline. 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  7. Chronic pain: Continue MS Contin and Gabapentin.   Code Status: Full Code Family Communication: N/A Disposition Plan: Not yet ready for discharge  [place].  Pager [phone]. If 7PM-7AM, please contact night-coverage.  06/04/2015, 2:13 PM  LOS: 2 days   Interim History: Pt still reports pain as 8/10 and localized to ribs, back and legs.   Consultants:  None  Procedures:  None  Antibiotics:  Vancomycin 5/3>>5/3  Cefepime 5/3>>5/3   Objective: Filed Vitals:   06/04/15 0445 06/04/15 0625 06/04/15 0752 06/04/15 1327  BP:  123/65    Pulse:  92    Temp:  98.7 F (37.1 C)    TempSrc:  Oral    Resp: 21 20 19  15   Height:      Weight:      SpO2: 92% 98% 92% 90%   Weight change:   Intake/Output Summary (Last 24 hours) at 06/04/15 1413 Last data filed at 06/04/15 0634  Gross per 24 hour  Intake 1956.4 ml  Output      0 ml  Net 1956.4 ml    General: Alert, awake, oriented x3, in mild distress secondary to pain.  HEENT: [place]/AT PEERL, EOMI, mild icterus Neck: Trachea midline,  no masses, no thyromegal,y no JVD, no carotid bruit OROPHARYNX:  Moist, No exudate/ erythema/lesions.  Heart: Regular rate and rhythm, without murmurs, rubs, gallops, PMI non-displaced, no heaves or thrills on palpation.  Lungs: Clear to auscultation, no wheezing or rhonchi noted. No increased vocal fremitus resonant to percussion  Abdomen: Soft,tenderness and superficial linear mass in RLQ, nondistended, positive bowel sounds, no masses no hepatosplenomegaly noted.  Neuro: No focal neurological deficits noted cranial nerves II through XII grossly intact.  Strength at functional baseline in bilateral upper and lower extremities. Musculoskeletal: No warmth swelling or erythema around joints, no spinal tenderness noted. Psychiatric: Patient alert and oriented x3, good insight and cognition, good recent to remote recall.    Data Reviewed: Basic Metabolic Panel:  Recent Labs Lab 05/31/15 0750 06/01/15 1111 06/02/15 1836 06/03/15 0450  NA 138 138 136 137  K 4.3 4.4 4.3 4.3  CL 110 111 110 110  CO2 19* 18* 19* 19*  GLUCOSE 108* 99 124* 115*  BUN 15 13 18  10  CREATININE 0.75 0.76 1.02 0.82  CALCIUM 9.2 9.0 8.6* 8.9   Liver Function Tests:  Recent Labs Lab 05/31/15 0750 06/01/15 1111 06/02/15 1836  AST 36 38 41  ALT 22 20 19   ALKPHOS 72 71 70  BILITOT 4.6* 5.5* 4.7*  PROT 7.6 7.8 7.3  ALBUMIN 4.3 4.5 4.1   No results for input(s): LIPASE, AMYLASE in the last 168 hours. No results for input(s): AMMONIA in the last 168 hours. CBC:  Recent Labs Lab 05/31/15 0750 06/01/15 1111 06/02/15 1833  06/03/15 0450  WBC 16.9* 15.0* 17.0* 20.4*  NEUTROABS 12.1* 10.8* 11.6* 12.6*  HGB 6.6* 6.6* 6.1* 6.5*  HCT 19.2* 18.9* 17.2* 18.6*  MCV 88.5 87.9 86.4 88.6  PLT 528* 497* 528* 544*   Cardiac Enzymes:  Recent Labs Lab 06/02/15 2215  TROPONINI 0.05*   BNP (last 3 results)  Recent Labs  08/29/14 0810 09/10/14 0612 11/30/14 0910  BNP 1117.0* 549.5* 639.7*    ProBNP (last 3 results) No results for input(s): PROBNP in the last 8760 hours.  CBG: No results for input(s): GLUCAP in the last 168 hours.  Recent Results (from the past 240 hour(s))  Blood culture (routine x 2)     Status: None (Preliminary result)   Collection Time: 06/02/15  6:33 PM  Result Value Ref Range Status   Specimen Description BLOOD PORTA CATH  Final   Special Requests BOTTLES DRAWN AEROBIC AND ANAEROBIC 5CC EACH  Final   Culture   Final    NO GROWTH 2 DAYS Performed at [facility]    Report Status PENDING  Incomplete  Urine culture     Status: Abnormal (Preliminary result)   Collection Time: 06/02/15  7:41 PM  Result Value Ref Range Status   Specimen Description URINE, CLEAN CATCH  Final   Special Requests NONE  Final   Culture 10,000 COLONIES/mL ENTEROCOCCUS SPECIES (A)  Final   Report Status PENDING  Incomplete  Blood culture (routine x 2)     Status: None (Preliminary result)   Collection Time: 06/02/15  9:52 PM  Result Value Ref Range Status   Specimen Description BLOOD RIGHT HAND  Final   Special Requests IN PEDIATRIC BOTTLE 3CC  Final   Culture   Final    NO GROWTH 1 DAY Performed at [facility]    Report Status PENDING  Incomplete     Studies: Dg Chest 2 View  06/02/2015  CLINICAL DATA:  Sickle cell anemia. Left-sided chest pain for 1 day. EXAM: CHEST  2 VIEW COMPARISON:  06/01/2015 FINDINGS: There is an enlarged cardiac silhouette, stable from 06/01/2015. No pleural effusions. No airspace consolidation. Mild vascular prominence, unchanged. Airway is unremarkable.  There is a left subclavian Port-A-Cath with tip at the cavoatrial junction. IMPRESSION: Enlarged cardiac silhouette, stable. No interval change from 06/01/2015. Electronically Signed   By: Andreas Newport M.D.   On: 06/02/2015 18:47   Dg Chest 2 View  06/01/2015  CLINICAL DATA:  Rib cage pain and mid back pain. Sickle cell crisis. EXAM: CHEST  2 VIEW COMPARISON:  05/07/2015 FINDINGS: Chronic cardiomegaly. Pulmonary vascularity is normal. No infiltrates or effusions. Power port in place. No bone abnormality. IMPRESSION: No acute abnormality.  Chronic cardiomegaly. Electronically Signed   By: Lorriane Shire M.D.   On: 06/01/2015 10:08   Dg Chest 2 View  05/07/2015  CLINICAL DATA:  Sickle cell pain crisis, chest pain EXAM: CHEST  2 VIEW COMPARISON:  04/26/2015 FINDINGS: Left subclavian power port catheter  tip SVC RA junction. Stable cardiomegaly with vascular congestion and basilar atelectasis/ parenchymal scarring. No superimposed pneumonia, collapse or consolidation. No edema, effusion or pneumothorax. Trachea midline. IMPRESSION: Stable cardiomegaly with vascular congestion and parenchymal scarring. No superimposed acute process. Electronically Signed   By: Jerilynn Mages.  Shick M.D.   On: 05/07/2015 09:05   Ct Abdomen Pelvis W Contrast  06/03/2015  CLINICAL DATA:  Fever flank pain and leukocytosis. EXAM: CT ABDOMEN AND PELVIS WITH CONTRAST TECHNIQUE: Multidetector CT imaging of the abdomen and pelvis was performed using the standard protocol following bolus administration of intravenous contrast. CONTRAST:  148mL ISOVUE-300 IOPAMIDOL (ISOVUE-300) INJECTION 61% COMPARISON:  06/01/2009 FINDINGS: Lower chest: Ventriculomegaly, unchanged. Chronic scarring in both lungs. Hepatobiliary: Cholecystectomy. No focal liver lesions. No bile duct dilatation. Pancreas: Normal Spleen: Small and calcified, unchanged. Adrenals/Urinary Tract: The adrenals and kidneys are normal in appearance. There is no urinary calculus evident. There  is no hydronephrosis or ureteral dilatation. Collecting systems and ureters appear unremarkable. Stomach/Bowel: There are normal appearances of the stomach, small bowel and colon. The appendix is normal. Vascular/Lymphatic: The abdominal aorta is normal in caliber. There is mild atherosclerotic calcification. There is no pathologically enlarged adenopathy in the abdomen or pelvis. There is a prominent number of small periaortic nodes, unchanged. Reproductive: Unremarkable Other: No acute inflammatory changes are evident in the abdomen or pelvis. There is no ascites. Musculoskeletal: Multiple bone infarcts. 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 Incidental umbilical hernia, nonobstructive. IMPRESSION: No acute findings are evident in the abdomen or pelvis. 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 Cardiomegaly. The findings are all typical of sickle cell disease. Electronically Signed   By: Andreas Newport M.D.   On: 06/03/2015 00:47    Scheduled Meds: . aspirin  81 mg Oral Daily  . cholecalciferol  2,000 Units Oral Daily  . enoxaparin  120 mg Subcutaneous Q24H  . folic acid  1 mg Oral q morning - 10a  . gabapentin  300 mg Oral TID  . HYDROmorphone   Intravenous Q4H  . ketorolac  30 mg Intravenous Q6H  . lisinopril  10 mg Oral Daily  . metoprolol succinate  50 mg Oral Daily  . morphine  30 mg Oral Q12H  . potassium chloride SA  20 mEq Oral Daily  . senna-docusate  1 tablet Oral BID   Continuous Infusions:    Time spent 25 minurtes

## 2015-06-04 NOTE — Progress Notes (Signed)
Patient calling for pain medication however patient very lethargic when RN enters room and cant stay awake when medication being administered.  Will continue to monitor.

## 2015-06-05 DIAGNOSIS — J9621 Acute and chronic respiratory failure with hypoxia: Secondary | ICD-10-CM

## 2015-06-05 LAB — CBC WITH DIFFERENTIAL/PLATELET
Basophils Absolute: 0 10*3/uL (ref 0.0–0.1)
Basophils Relative: 0 %
EOS ABS: 0.8 10*3/uL — AB (ref 0.0–0.7)
Eosinophils Relative: 4 %
HCT: 17.6 % — ABNORMAL LOW (ref 39.0–52.0)
Hemoglobin: 6.1 g/dL — CL (ref 13.0–17.0)
LYMPHS ABS: 2.9 10*3/uL (ref 0.7–4.0)
Lymphocytes Relative: 15 %
MCH: 31.4 pg (ref 26.0–34.0)
MCHC: 34.7 g/dL (ref 30.0–36.0)
MCV: 90.7 fL (ref 78.0–100.0)
MONO ABS: 3.1 10*3/uL — AB (ref 0.1–1.0)
Monocytes Relative: 16 %
NEUTROS ABS: 12.3 10*3/uL — AB (ref 1.7–7.7)
Neutrophils Relative %: 65 %
PLATELETS: 463 10*3/uL — AB (ref 150–400)
RBC: 1.94 MIL/uL — AB (ref 4.22–5.81)
RDW: 25 % — ABNORMAL HIGH (ref 11.5–15.5)
WBC: 19.1 10*3/uL — AB (ref 4.0–10.5)
nRBC: 9 /100 WBC — ABNORMAL HIGH

## 2015-06-05 LAB — BASIC METABOLIC PANEL
Anion gap: 8 (ref 5–15)
BUN: 14 mg/dL (ref 6–20)
CO2: 19 mmol/L — ABNORMAL LOW (ref 22–32)
CREATININE: 0.78 mg/dL (ref 0.61–1.24)
Calcium: 9.1 mg/dL (ref 8.9–10.3)
Chloride: 110 mmol/L (ref 101–111)
GFR calc Af Amer: >60 mL/min (ref >60)
GLUCOSE: 120 mg/dL — AB (ref 65–99)
Potassium: 4.5 mmol/L (ref 3.5–5.1)
SODIUM: 137 mmol/L (ref 135–145)

## 2015-06-05 LAB — URINE CULTURE: Culture: 30000 — AB

## 2015-06-05 LAB — RETICULOCYTES
RBC.: 1.94 MIL/uL — ABNORMAL LOW (ref 4.22–5.81)
RETIC CT PCT: 18.1 % — AB (ref 0.4–3.1)
Retic Count, Absolute: 351.1 10*3/uL — ABNORMAL HIGH (ref 19.0–186.0)

## 2015-06-05 LAB — PREPARE RBC (CROSSMATCH)

## 2015-06-05 MED ORDER — SODIUM CHLORIDE 0.9 % IV SOLN
Freq: Once | INTRAVENOUS | Status: AC
  Administered 2015-06-06: 16:00:00 via INTRAVENOUS

## 2015-06-05 NOTE — Care Management Important Message (Signed)
Important Message  Patient Details  Name: BRENIN MCCARLEY MRN: [id number] Date of Birth: [birth year]   Medicare Important Message Given:  Yes    [name] 06/05/2015, 9:41 AMImportant Message  Patient Details  Name: JUNIE GULBRANDSON MRN: [id number] Date of Birth: 1980-05-12   Medicare Important Message Given:  Yes    [name] 06/05/2015, 9:41 AM

## 2015-06-05 NOTE — Progress Notes (Signed)
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 Hb SS with crisis:Pt's pain escalated last night likely secondary to significant change in barometric pressure. Will continue the PCA at current dosing and frequency of clinician assisted doses at every 2 hours. Continue Toradol and decrease IVF.  2. Acute in Chronic Respiratory Failure with Hypoxemia: Oxygen Saturations below baseline. I feel that this is secondary to anemia. He has shown a pattern of decreased saturation when Hb gets to the level of about 6 g/dl. Will transfuse 1 unit RBC's.  3. LLQ abdominal pain: This is over the area where he injects his Lovenox. Continue warm compress to area locally. 4. Leukocytosis: No evidence of infection. Likely related to bone marrow activity. Discontinued antibiotics.  5. Fever: Pt had a low grade fever in the ED last night. However no source of infection. He was started empirically on IV antibiotics. However I have discontinued them as no source of infection. Will observe.  6. Anemia of chronic disease: Hb currently below baseline. Will transfuse 1 Unit RBC's. 7. 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  8. Chronic pain: Continue MS Contin and Gabapentin.   Code Status: Full Code Family Communication: N/A Disposition Plan: Not yet ready for discharge  [place].  Pager [phone]. If 7PM-7AM, please contact night-coverage.  06/05/2015, 11:56 AM  LOS: 3 days   Interim History: Pt still reports pain as 8/10 and localized to ribs, back and legs.   Consultants:  None  Procedures:  None  Antibiotics:  Vancomycin 5/3>>5/3  Cefepime 5/3>>5/3   Objective: Filed  Vitals:   06/05/15 0501 06/05/15 0503 06/05/15 0800 06/05/15 0950  BP:  116/80  132/88  Pulse:  87  96  Temp:  98 F (36.7 C)  96.7 F (35.9 C)  TempSrc:  Oral  Oral  Resp: 15 14 13 15   Height:      Weight:      SpO2: 94% 95% 92% 99%   Weight change:   Intake/Output Summary (Last 24 hours) at 06/05/15 1156 Last data filed at 06/05/15 0951  Gross per 24 hour  Intake 1503.8 ml  Output   2175 ml  Net -671.2 ml    General: Alert, awake, oriented x3, in mild distress secondary to pain.  HEENT: [place]/AT PEERL, EOMI, mild icterus Neck: Trachea midline,  no masses, no thyromegal,y no JVD, no carotid bruit OROPHARYNX:  Moist, No exudate/ erythema/lesions.  Heart: Regular rate and rhythm, without murmurs, rubs, gallops, PMI non-displaced, no heaves or thrills on palpation.  Lungs: Clear to auscultation, no wheezing or rhonchi noted. No increased vocal fremitus resonant to percussion  Abdomen: Soft,tenderness and superficial linear mass in RLQ, nondistended, positive bowel sounds, no masses no hepatosplenomegaly noted.  Neuro: No focal neurological deficits noted cranial nerves II through XII grossly intact.  Strength at functional baseline in bilateral upper and lower extremities. Musculoskeletal: No warmth swelling or erythema around joints, no spinal tenderness noted. Psychiatric: Patient alert and oriented x3, good insight and cognition, good recent to remote recall.    Data Reviewed: Basic Metabolic Panel:  Recent Labs Lab 05/31/15 0750 06/01/15 1111 06/02/15 1836 06/03/15 0450 06/05/15 0615  NA 138 138 136 137 137  K  4.3 4.4 4.3 4.3 4.5  CL 110 111 110 110 110  CO2 19* 18* 19* 19* 19*  GLUCOSE 108* 99 124* 115* 120*  BUN 15 13 18 10 14   CREATININE 0.75 0.76 1.02 0.82 0.78  CALCIUM 9.2 9.0 8.6* 8.9 9.1   Liver Function Tests:  Recent Labs Lab 05/31/15 0750 06/01/15 1111 06/02/15 1836  AST 36 38 41  ALT 22 20 19   ALKPHOS 72 71 70  BILITOT 4.6* 5.5* 4.7*  PROT  7.6 7.8 7.3  ALBUMIN 4.3 4.5 4.1   No results for input(s): LIPASE, AMYLASE in the last 168 hours. No results for input(s): AMMONIA in the last 168 hours. CBC:  Recent Labs Lab 05/31/15 0750 06/01/15 1111 06/02/15 1833 06/03/15 0450 06/05/15 0615  WBC 16.9* 15.0* 17.0* 20.4* 19.1*  NEUTROABS 12.1* 10.8* 11.6* 12.6* 12.3*  HGB 6.6* 6.6* 6.1* 6.5* 6.1*  HCT 19.2* 18.9* 17.2* 18.6* 17.6*  MCV 88.5 87.9 86.4 88.6 90.7  PLT 528* 497* 528* 544* 463*   Cardiac Enzymes:  Recent Labs Lab 06/02/15 2215  TROPONINI 0.05*   BNP (last 3 results)  Recent Labs  08/29/14 0810 09/10/14 0612 11/30/14 0910  BNP 1117.0* 549.5* 639.7*    ProBNP (last 3 results) No results for input(s): PROBNP in the last 8760 hours.  CBG: No results for input(s): GLUCAP in the last 168 hours.  Recent Results (from the past 240 hour(s))  Blood culture (routine x 2)     Status: None (Preliminary result)   Collection Time: 06/02/15  6:33 PM  Result Value Ref Range Status   Specimen Description BLOOD PORTA CATH  Final   Special Requests BOTTLES DRAWN AEROBIC AND ANAEROBIC 5CC EACH  Final   Culture   Final    NO GROWTH 2 DAYS Performed at [facility]    Report Status PENDING  Incomplete  Urine culture     Status: Abnormal   Collection Time: 06/02/15  7:41 PM  Result Value Ref Range Status   Specimen Description URINE, CLEAN CATCH  Final   Special Requests NONE  Final   Culture 30,000 COLONIES/mL ENTEROCOCCUS SPECIES (A)  Final   Report Status 06/05/2015 FINAL  Final   Organism ID, Bacteria ENTEROCOCCUS SPECIES (A)  Final      Susceptibility   Enterococcus species - MIC*    AMPICILLIN <=2 SENSITIVE Sensitive     LEVOFLOXACIN 1 SENSITIVE Sensitive     NITROFURANTOIN <=16 SENSITIVE Sensitive     VANCOMYCIN 2 SENSITIVE Sensitive     * 30,000 COLONIES/mL ENTEROCOCCUS SPECIES  Blood culture (routine x 2)     Status: None (Preliminary result)   Collection Time: 06/02/15  9:52 PM  Result  Value Ref Range Status   Specimen Description BLOOD RIGHT HAND  Final   Special Requests IN PEDIATRIC BOTTLE 3CC  Final   Culture   Final    NO GROWTH 1 DAY Performed at [facility]    Report Status PENDING  Incomplete     Studies: Dg Chest 2 View  06/02/2015  CLINICAL DATA:  Sickle cell anemia. Left-sided chest pain for 1 day. EXAM: CHEST  2 VIEW COMPARISON:  06/01/2015 FINDINGS: There is an enlarged cardiac silhouette, stable from 06/01/2015. No pleural effusions. No airspace consolidation. Mild vascular prominence, unchanged. Airway is unremarkable. There is a left subclavian Port-A-Cath with tip at the cavoatrial junction. IMPRESSION: Enlarged cardiac silhouette, stable. No interval change from 06/01/2015. Electronically Signed   By: Valerie Roys.D.  On: 06/02/2015 18:47   Dg Chest 2 View  06/01/2015  CLINICAL DATA:  Rib cage pain and mid back pain. Sickle cell crisis. EXAM: CHEST  2 VIEW COMPARISON:  05/07/2015 FINDINGS: Chronic cardiomegaly. Pulmonary vascularity is normal. No infiltrates or effusions. Power port in place. No bone abnormality. IMPRESSION: No acute abnormality.  Chronic cardiomegaly. Electronically Signed   By: Lorriane Shire M.D.   On: 06/01/2015 10:08   Dg Chest 2 View  05/07/2015  CLINICAL DATA:  Sickle cell pain crisis, chest pain EXAM: CHEST  2 VIEW COMPARISON:  04/26/2015 FINDINGS: Left subclavian power port catheter tip SVC RA junction. Stable cardiomegaly with vascular congestion and basilar atelectasis/ parenchymal scarring. No superimposed pneumonia, collapse or consolidation. No edema, effusion or pneumothorax. Trachea midline. IMPRESSION: Stable cardiomegaly with vascular congestion and parenchymal scarring. No superimposed acute process. Electronically Signed   By: Jerilynn Mages.  Shick M.D.   On: 05/07/2015 09:05   Ct Abdomen Pelvis W Contrast  06/03/2015  CLINICAL DATA:  Fever flank pain and leukocytosis. EXAM: CT ABDOMEN AND PELVIS WITH CONTRAST TECHNIQUE:  Multidetector CT imaging of the abdomen and pelvis was performed using the standard protocol following bolus administration of intravenous contrast. CONTRAST:  187mL ISOVUE-300 IOPAMIDOL (ISOVUE-300) INJECTION 61% COMPARISON:  06/01/2009 FINDINGS: Lower chest: Ventriculomegaly, unchanged. Chronic scarring in both lungs. Hepatobiliary: Cholecystectomy. No focal liver lesions. No bile duct dilatation. Pancreas: Normal Spleen: Small and calcified, unchanged. Adrenals/Urinary Tract: The adrenals and kidneys are normal in appearance. There is no urinary calculus evident. There is no hydronephrosis or ureteral dilatation. Collecting systems and ureters appear unremarkable. Stomach/Bowel: There are normal appearances of the stomach, small bowel and colon. The appendix is normal. Vascular/Lymphatic: The abdominal aorta is normal in caliber. There is mild atherosclerotic calcification. There is no pathologically enlarged adenopathy in the abdomen or pelvis. There is a prominent number of small periaortic nodes, unchanged. Reproductive: Unremarkable Other: No acute inflammatory changes are evident in the abdomen or pelvis. There is no ascites. Musculoskeletal: Multiple bone infarcts. 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 Incidental umbilical hernia, nonobstructive. IMPRESSION: No acute findings are evident in the abdomen or pelvis. 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 Cardiomegaly. The findings are all typical of sickle cell disease. Electronically Signed   By: Andreas Newport M.D.   On: 06/03/2015 00:47    Scheduled Meds: . aspirin  81 mg Oral Daily  . cholecalciferol  2,000 Units Oral Daily  . enoxaparin  120 mg Subcutaneous Q24H  . folic acid  1 mg Oral q morning - 10a  . gabapentin  300 mg Oral TID  . HYDROmorphone   Intravenous Q4H  . ketorolac  30 mg Intravenous Q6H  . lisinopril  10 mg Oral Daily  . metoprolol succinate  50 mg Oral Daily  . morphine  30 mg Oral Q12H  .  potassium chloride SA  20 mEq Oral Daily  . senna-docusate  1 tablet Oral BID   Continuous Infusions:    Time spent 25 minurtes

## 2015-06-06 DIAGNOSIS — D599 Acquired hemolytic anemia, unspecified: Secondary | ICD-10-CM

## 2015-06-06 LAB — CBC WITH DIFFERENTIAL/PLATELET
BASOS PCT: 1 %
Basophils Absolute: 0.2 10*3/uL — ABNORMAL HIGH (ref 0.0–0.1)
EOS ABS: 0.9 10*3/uL — AB (ref 0.0–0.7)
EOS PCT: 5 %
HEMATOCRIT: 20.4 % — AB (ref 39.0–52.0)
HEMOGLOBIN: 7 g/dL — AB (ref 13.0–17.0)
LYMPHS PCT: 18 %
Lymphs Abs: 3.3 10*3/uL (ref 0.7–4.0)
MCH: 31.1 pg (ref 26.0–34.0)
MCHC: 34.3 g/dL (ref 30.0–36.0)
MCV: 90.7 fL (ref 78.0–100.0)
MONOS PCT: 17 %
Monocytes Absolute: 3.1 10*3/uL — ABNORMAL HIGH (ref 0.1–1.0)
NEUTROS ABS: 11 10*3/uL — AB (ref 1.7–7.7)
Neutrophils Relative %: 59 %
Platelets: 492 10*3/uL — ABNORMAL HIGH (ref 150–400)
RBC: 2.25 MIL/uL — ABNORMAL LOW (ref 4.22–5.81)
RDW: 24.6 % — ABNORMAL HIGH (ref 11.5–15.5)
WBC: 18.5 10*3/uL — ABNORMAL HIGH (ref 4.0–10.5)

## 2015-06-06 LAB — RETICULOCYTES
RBC.: 2.25 MIL/uL — ABNORMAL LOW (ref 4.22–5.81)
Retic Count, Absolute: 499.5 10*3/uL — ABNORMAL HIGH (ref 19.0–186.0)
Retic Ct Pct: 22.2 % — ABNORMAL HIGH (ref 0.4–3.1)

## 2015-06-06 LAB — LACTATE DEHYDROGENASE: LDH: 520 U/L — AB (ref 98–192)

## 2015-06-06 NOTE — Progress Notes (Signed)
SICKLE CELL SERVICE PROGRESS NOTE  [name] [id number] DOB: [birth year] DOA: 06/02/2015 PCP: Gerald Chessman, MD  Assessment/Plan: Principal Problem:   Sickle cell anemia with crisis ([name]) Active Problems:   Pulmonary HTN ([place])   Hx of pulmonary embolus   Sickle cell pain crisis ([name])   Fever  1. Hb SS with crisis:Pt's pain still at 6-7/10. However patient appears to be slowly decreasing. Will current regimen of PCA, clinician assisted doses and Toradol for 1 more day. Plan to transition to oral analgesics starting tomorrow. 2. Acute in Chronic Respiratory Failure with Hypoxemia: Oxygen Saturations still below baseline but improves with use of incentive spirometer. He status post transfusion of one unit of RBC's. We'll continue to monitor oxygen saturations and encourage incentive spirometer use. 3. Acute hemolysis in a setting of anemia of chronic disease: Patient continues to have a significantly elevated LDH which reflects the level of hemolysis. We'll continue to monitor hemoglobin and hold on any further transfusions at this time. 4. Leukocytosis: No evidence of infection. Likely related to bone marrow activity. Discontinued antibiotics.  5. Fever: Pt had a low grade fever in the ED and no further fevers noted. However urine showed Enterococcus species in urine but in low colonies. Will not treat unless he becomes symptomatic. 6.  LLQ abdominal pain:Markedly improved with use of warm compresses  7. 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  8. Chronic pain: Continue MS Contin and Gabapentin.   Code Status: Full Code Family Communication: N/A Disposition Plan: Not yet ready for discharge  [name].  Pager [phone]. If 7PM-7AM, please contact night-coverage.  06/06/2015, 3:36 PM  LOS: 4 days   Interim History: Pt still reports pain as 6-7/10 and localized to ribs, back and legs.    Consultants:  None  Procedures:  None  Antibiotics:  Vancomycin 5/3>>5/3  Cefepime 5/3>>5/3   Objective: Filed Vitals:   06/06/15 0755 06/06/15 0940 06/06/15 1200 06/06/15 1320  BP:  118/89  121/83  Pulse:  78  88  Temp:  97.7 F (36.5 C)  98 F (36.7 C)  TempSrc:  Oral  Oral  Resp: 17 14 16 15   Height:      Weight:      SpO2: 98% 98% 96% 99%   Weight change: 1.6 oz (0.045 kg)  Intake/Output Summary (Last 24 hours) at 06/06/15 1536 Last data filed at 06/06/15 1259  Gross per 24 hour  Intake   2555 ml  Output   2200 ml  Net    355 ml    General: Alert, awake, oriented x3, in mild distress secondary to pain.  HEENT: [place]/AT PEERL, EOMI, mild icterus Neck: Trachea midline,  no masses, no thyromegal,y no JVD, no carotid bruit OROPHARYNX:  Moist, No exudate/ erythema/lesions.  Heart: Regular rate and rhythm, without murmurs, rubs, gallops, PMI non-displaced, no heaves or thrills on palpation.  Lungs: Clear to auscultation, no wheezing or rhonchi noted. No increased vocal fremitus resonant to percussion  Abdomen: Soft,tenderness and superficial linear mass in RLQ, nondistended, positive bowel sounds, no masses no hepatosplenomegaly noted.  Neuro: No focal neurological deficits noted cranial nerves II through XII grossly intact.  Strength at functional baseline in bilateral upper and lower extremities. Musculoskeletal: No warmth swelling or erythema around joints, no spinal tenderness noted. Psychiatric: Patient alert and oriented x3, good insight and cognition, good recent to remote recall.    Data Reviewed: Basic Metabolic Panel:  Recent Labs Lab 05/31/15 0750 06/01/15 1111  06/02/15 1836 06/03/15 0450 06/05/15 0615  NA 138 138 136 137 137  K 4.3 4.4 4.3 4.3 4.5  CL 110 111 110 110 110  CO2 19* 18* 19* 19* 19*  GLUCOSE 108* 99 124* 115* 120*  BUN 15 13 18 10 14   CREATININE 0.75 0.76 1.02 0.82 0.78  CALCIUM 9.2 9.0 8.6* 8.9 9.1   Liver Function  Tests:  Recent Labs Lab 05/31/15 0750 06/01/15 1111 06/02/15 1836  AST 36 38 41  ALT 22 20 19   ALKPHOS 72 71 70  BILITOT 4.6* 5.5* 4.7*  PROT 7.6 7.8 7.3  ALBUMIN 4.3 4.5 4.1   No results for input(s): LIPASE, AMYLASE in the last 168 hours. No results for input(s): AMMONIA in the last 168 hours. CBC:  Recent Labs Lab 06/01/15 1111 06/02/15 1833 06/03/15 0450 06/05/15 0615 06/06/15 0500  WBC 15.0* 17.0* 20.4* 19.1* 18.5*  NEUTROABS 10.8* 11.6* 12.6* 12.3* 11.0*  HGB 6.6* 6.1* 6.5* 6.1* 7.0*  HCT 18.9* 17.2* 18.6* 17.6* 20.4*  MCV 87.9 86.4 88.6 90.7 90.7  PLT 497* 528* 544* 463* 492*   Cardiac Enzymes:  Recent Labs Lab 06/02/15 2215  TROPONINI 0.05*   BNP (last 3 results)  Recent Labs  08/29/14 0810 09/10/14 0612 11/30/14 0910  BNP 1117.0* 549.5* 639.7*    ProBNP (last 3 results) No results for input(s): PROBNP in the last 8760 hours.  CBG: No results for input(s): GLUCAP in the last 168 hours.  Recent Results (from the past 240 hour(s))  Blood culture (routine x 2)     Status: None (Preliminary result)   Collection Time: 06/02/15  6:33 PM  Result Value Ref Range Status   Specimen Description BLOOD PORTA CATH  Final   Special Requests BOTTLES DRAWN AEROBIC AND ANAEROBIC 5CC EACH  Final   Culture   Final    NO GROWTH 3 DAYS Performed at [facility]    Report Status PENDING  Incomplete  Urine culture     Status: Abnormal   Collection Time: 06/02/15  7:41 PM  Result Value Ref Range Status   Specimen Description URINE, CLEAN CATCH  Final   Special Requests NONE  Final   Culture 30,000 COLONIES/mL ENTEROCOCCUS SPECIES (A)  Final   Report Status 06/05/2015 FINAL  Final   Organism ID, Bacteria ENTEROCOCCUS SPECIES (A)  Final      Susceptibility   Enterococcus species - MIC*    AMPICILLIN <=2 SENSITIVE Sensitive     LEVOFLOXACIN 1 SENSITIVE Sensitive     NITROFURANTOIN <=16 SENSITIVE Sensitive     VANCOMYCIN 2 SENSITIVE Sensitive     *  30,000 COLONIES/mL ENTEROCOCCUS SPECIES  Blood culture (routine x 2)     Status: None (Preliminary result)   Collection Time: 06/02/15  9:52 PM  Result Value Ref Range Status   Specimen Description BLOOD RIGHT HAND  Final   Special Requests IN PEDIATRIC BOTTLE 3CC  Final   Culture   Final    NO GROWTH 2 DAYS Performed at [facility]    Report Status PENDING  Incomplete     Studies: Dg Chest 2 View  06/02/2015  CLINICAL DATA:  Sickle cell anemia. Left-sided chest pain for 1 day. EXAM: CHEST  2 VIEW COMPARISON:  06/01/2015 FINDINGS: There is an enlarged cardiac silhouette, stable from 06/01/2015. No pleural effusions. No airspace consolidation. Mild vascular prominence, unchanged. Airway is unremarkable. There is a left subclavian Port-A-Cath with tip at the cavoatrial junction. IMPRESSION: Enlarged cardiac silhouette, stable.  No interval change from 06/01/2015. Electronically Signed   By: Andreas Newport M.D.   On: 06/02/2015 18:47   Dg Chest 2 View  06/01/2015  CLINICAL DATA:  Rib cage pain and mid back pain. Sickle cell crisis. EXAM: CHEST  2 VIEW COMPARISON:  05/07/2015 FINDINGS: Chronic cardiomegaly. Pulmonary vascularity is normal. No infiltrates or effusions. Power port in place. No bone abnormality. IMPRESSION: No acute abnormality.  Chronic cardiomegaly. Electronically Signed   By: Lorriane Shire M.D.   On: 06/01/2015 10:08   Ct Abdomen Pelvis W Contrast  06/03/2015  CLINICAL DATA:  Fever flank pain and leukocytosis. EXAM: CT ABDOMEN AND PELVIS WITH CONTRAST TECHNIQUE: Multidetector CT imaging of the abdomen and pelvis was performed using the standard protocol following bolus administration of intravenous contrast. CONTRAST:  119mL ISOVUE-300 IOPAMIDOL (ISOVUE-300) INJECTION 61% COMPARISON:  06/01/2009 FINDINGS: Lower chest: Ventriculomegaly, unchanged. Chronic scarring in both lungs. Hepatobiliary: Cholecystectomy. No focal liver lesions. No bile duct dilatation. Pancreas: Normal  Spleen: Small and calcified, unchanged. Adrenals/Urinary Tract: The adrenals and kidneys are normal in appearance. There is no urinary calculus evident. There is no hydronephrosis or ureteral dilatation. Collecting systems and ureters appear unremarkable. Stomach/Bowel: There are normal appearances of the stomach, small bowel and colon. The appendix is normal. Vascular/Lymphatic: The abdominal aorta is normal in caliber. There is mild atherosclerotic calcification. There is no pathologically enlarged adenopathy in the abdomen or pelvis. There is a prominent number of small periaortic nodes, unchanged. Reproductive: Unremarkable Other: No acute inflammatory changes are evident in the abdomen or pelvis. There is no ascites. Musculoskeletal: Multiple bone infarcts. 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 Incidental umbilical hernia, nonobstructive. IMPRESSION: No acute findings are evident in the abdomen or pelvis. 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 Cardiomegaly. The findings are all typical of sickle cell disease. Electronically Signed   By: Andreas Newport M.D.   On: 06/03/2015 00:47    Scheduled Meds: . sodium chloride   Intravenous Once  . aspirin  81 mg Oral Daily  . cholecalciferol  2,000 Units Oral Daily  . enoxaparin  120 mg Subcutaneous Q24H  . folic acid  1 mg Oral q morning - 10a  . gabapentin  300 mg Oral TID  . HYDROmorphone   Intravenous Q4H  . ketorolac  30 mg Intravenous Q6H  . lisinopril  10 mg Oral Daily  . metoprolol succinate  50 mg Oral Daily  . morphine  30 mg Oral Q12H  . potassium chloride SA  20 mEq Oral Daily  . senna-docusate  1 tablet Oral BID   Continuous Infusions:    Time spent 25 minurtes

## 2015-06-07 DIAGNOSIS — I272 Other secondary pulmonary hypertension: Secondary | ICD-10-CM

## 2015-06-07 LAB — CULTURE, BLOOD (ROUTINE X 2): CULTURE: NO GROWTH

## 2015-06-07 MED ORDER — HYDROMORPHONE HCL 4 MG PO TABS
4.0000 mg | ORAL_TABLET | ORAL | Status: DC
  Administered 2015-06-07 – 2015-06-08 (×8): 4 mg via ORAL
  Filled 2015-06-07 (×8): qty 1

## 2015-06-07 NOTE — Progress Notes (Signed)
SICKLE CELL SERVICE PROGRESS NOTE  [name] [id number] DOB: 1980-03-03 DOA: 06/02/2015 PCP: Gerald Chessman, MD  Assessment/Plan: Principal Problem:   Sickle cell anemia with crisis ([place]) Active Problems:   Pulmonary HTN ([place])   Hx of pulmonary embolus   Sickle cell pain crisis ([place])   Fever  1. Hb SS with crisis: Will discontinue clinician assisted doses and continue PCA at current settings. Toradol discontinued. Will change to ibuprofen. If pain continues to be controlled on his regimen will discharge patient tomorrow. 2. Acute in Chronic Respiratory Failure with Hypoxemia: Oxygen Saturations improved after transfusion. Continue to encourage incentive spirometer use and wean oxygen for saturations greater than 92%. 3. Acute hemolysis in a setting of anemia of chronic disease: Patient continues to have a significantly elevated LDH which reflects the level of hemolysis. He appears clinically stable and I do not see an indication for further transfusion at this time. Will check hemoglobin tomorrow prior to discharge.  4. Leukocytosis: Patient has chronically elevated white blood cell count. No evidence of infection. Likely related to bone marrow activity. Discontinued antibiotics.  5. Fever: Pt had a low grade fever in the ED and no further fevers noted. However urine showed Enterococcus species in urine but in low colonies. Will not treat unless he becomes symptomatic. 6.  LLQ abdominal pain:Markedly improved with use of warm compresses  7. 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  8. Chronic pain: Continue MS Contin and Gabapentin.   Code Status: Full Code Family Communication: N/A Disposition Plan: Anticipate discharge home tomorrow  Gerald Dura A.  Pager [phone]. If 7PM-7AM, please contact night-coverage.  06/07/2015, 9:53 AM  LOS: 5 days   Interim History: Pt reports pain as 5-6/10 and localized to back.  Patient had a bowel movement yesterday.  Consultants:  None  Procedures:  None  Antibiotics:  Vancomycin 5/3>>5/3  Cefepime 5/3>>5/3   Objective: Filed Vitals:   06/07/15 0215 06/07/15 0517 06/07/15 0545 06/07/15 0759  BP: 98/66  101/71   Pulse: 67  56   Temp: 98.2 F (36.8 C)  98.1 F (36.7 C)   TempSrc: Oral  Oral   Resp: 14 12 12 9   Height:      Weight:   169 lb 1.6 oz (76.703 kg)   SpO2: 97% 99% 95% 95%   Weight change: 7 lb 14.4 oz (3.583 kg)  Intake/Output Summary (Last 24 hours) at 06/07/15 0953 Last data filed at 06/07/15 [id number]  Gross per 24 hour  Intake   1170 ml  Output   1825 ml  Net   -655 ml    General: Alert, awake, oriented x3, inNo apparent distress.  HEENT: [place]/AT PEERL, EOMI, mild icterus Neck: Trachea midline,  no masses, no thyromegal,y no JVD, no carotid bruit OROPHARYNX:  Moist, No exudate/ erythema/lesions.  Heart: Regular rate and rhythm, without murmurs, rubs, gallops, PMI non-displaced, no heaves or thrills on palpation.  Lungs: Clear to auscultation, no wheezing or rhonchi noted. No increased vocal fremitus resonant to percussion  Abdomen: Soft,tenderness and superficial linear mass in RLQ, nondistended, positive bowel sounds, no masses no hepatosplenomegaly noted.  Neuro: No focal neurological deficits noted cranial nerves II through XII grossly intact.  Strength at functional baseline in bilateral upper and lower extremities. Musculoskeletal: No warmth swelling or erythema around joints, no spinal tenderness noted. Psychiatric: Patient alert and oriented x3, good insight and cognition, good recent to remote recall.    Data Reviewed: Basic Metabolic Panel:  Recent  Labs Lab 06/01/15 1111 06/02/15 1836 06/03/15 0450 06/05/15 0615  NA 138 136 137 137  K 4.4 4.3 4.3 4.5  CL 111 110 110 110  CO2 18* 19* 19* 19*  GLUCOSE 99 124* 115* 120*  BUN 13 18 10 14   CREATININE 0.76 1.02 0.82 0.78  CALCIUM 9.0 8.6* 8.9 9.1   Liver  Function Tests:  Recent Labs Lab 06/01/15 1111 06/02/15 1836  AST 38 41  ALT 20 19  ALKPHOS 71 70  BILITOT 5.5* 4.7*  PROT 7.8 7.3  ALBUMIN 4.5 4.1   No results for input(s): LIPASE, AMYLASE in the last 168 hours. No results for input(s): AMMONIA in the last 168 hours. CBC:  Recent Labs Lab 06/01/15 1111 06/02/15 1833 06/03/15 0450 06/05/15 0615 06/06/15 0500  WBC 15.0* 17.0* 20.4* 19.1* 18.5*  NEUTROABS 10.8* 11.6* 12.6* 12.3* 11.0*  HGB 6.6* 6.1* 6.5* 6.1* 7.0*  HCT 18.9* 17.2* 18.6* 17.6* 20.4*  MCV 87.9 86.4 88.6 90.7 90.7  PLT 497* 528* 544* 463* 492*   Cardiac Enzymes:  Recent Labs Lab 06/02/15 2215  TROPONINI 0.05*   BNP (last 3 results)  Recent Labs  08/29/14 0810 09/10/14 0612 11/30/14 0910  BNP 1117.0* 549.5* 639.7*    ProBNP (last 3 results) No results for input(s): PROBNP in the last 8760 hours.  CBG: No results for input(s): GLUCAP in the last 168 hours.  Recent Results (from the past 240 hour(s))  Blood culture (routine x 2)     Status: None (Preliminary result)   Collection Time: 06/02/15  6:33 PM  Result Value Ref Range Status   Specimen Description BLOOD PORTA CATH  Final   Special Requests BOTTLES DRAWN AEROBIC AND ANAEROBIC 5CC EACH  Final   Culture   Final    NO GROWTH 4 DAYS Performed at [facility]    Report Status PENDING  Incomplete  Urine culture     Status: Abnormal   Collection Time: 06/02/15  7:41 PM  Result Value Ref Range Status   Specimen Description URINE, CLEAN CATCH  Final   Special Requests NONE  Final   Culture 30,000 COLONIES/mL ENTEROCOCCUS SPECIES (A)  Final   Report Status 06/05/2015 FINAL  Final   Organism ID, Bacteria ENTEROCOCCUS SPECIES (A)  Final      Susceptibility   Enterococcus species - MIC*    AMPICILLIN <=2 SENSITIVE Sensitive     LEVOFLOXACIN 1 SENSITIVE Sensitive     NITROFURANTOIN <=16 SENSITIVE Sensitive     VANCOMYCIN 2 SENSITIVE Sensitive     * 30,000 COLONIES/mL  ENTEROCOCCUS SPECIES  Blood culture (routine x 2)     Status: None (Preliminary result)   Collection Time: 06/02/15  9:52 PM  Result Value Ref Range Status   Specimen Description BLOOD RIGHT HAND  Final   Special Requests IN PEDIATRIC BOTTLE 3CC  Final   Culture   Final    NO GROWTH 3 DAYS Performed at [facility]    Report Status PENDING  Incomplete     Studies: Dg Chest 2 View  06/02/2015  CLINICAL DATA:  Sickle cell anemia. Left-sided chest pain for 1 day. EXAM: CHEST  2 VIEW COMPARISON:  06/01/2015 FINDINGS: There is an enlarged cardiac silhouette, stable from 06/01/2015. No pleural effusions. No airspace consolidation. Mild vascular prominence, unchanged. Airway is unremarkable. There is a left subclavian Port-A-Cath with tip at the cavoatrial junction. IMPRESSION: Enlarged cardiac silhouette, stable. No interval change from 06/01/2015. Electronically Signed   By: Shaune Pascal  Alroy Dust M.D.   On: 06/02/2015 18:47   Dg Chest 2 View  06/01/2015  CLINICAL DATA:  Rib cage pain and mid back pain. Sickle cell crisis. EXAM: CHEST  2 VIEW COMPARISON:  05/07/2015 FINDINGS: Chronic cardiomegaly. Pulmonary vascularity is normal. No infiltrates or effusions. Power port in place. No bone abnormality. IMPRESSION: No acute abnormality.  Chronic cardiomegaly. Electronically Signed   By: Lorriane Shire M.D.   On: 06/01/2015 10:08   Ct Abdomen Pelvis W Contrast  06/03/2015  CLINICAL DATA:  Fever flank pain and leukocytosis. EXAM: CT ABDOMEN AND PELVIS WITH CONTRAST TECHNIQUE: Multidetector CT imaging of the abdomen and pelvis was performed using the standard protocol following bolus administration of intravenous contrast. CONTRAST:  158mL ISOVUE-300 IOPAMIDOL (ISOVUE-300) INJECTION 61% COMPARISON:  06/01/2009 FINDINGS: Lower chest: Ventriculomegaly, unchanged. Chronic scarring in both lungs. Hepatobiliary: Cholecystectomy. No focal liver lesions. No bile duct dilatation. Pancreas: Normal Spleen: Small and  calcified, unchanged. Adrenals/Urinary Tract: The adrenals and kidneys are normal in appearance. There is no urinary calculus evident. There is no hydronephrosis or ureteral dilatation. Collecting systems and ureters appear unremarkable. Stomach/Bowel: There are normal appearances of the stomach, small bowel and colon. The appendix is normal. Vascular/Lymphatic: The abdominal aorta is normal in caliber. There is mild atherosclerotic calcification. There is no pathologically enlarged adenopathy in the abdomen or pelvis. There is a prominent number of small periaortic nodes, unchanged. Reproductive: Unremarkable Other: No acute inflammatory changes are evident in the abdomen or pelvis. There is no ascites. Musculoskeletal: Multiple bone infarcts. 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 Incidental umbilical hernia, nonobstructive. IMPRESSION: No acute findings are evident in the abdomen or pelvis. 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 Cardiomegaly. The findings are all typical of sickle cell disease. Electronically Signed   By: Andreas Newport M.D.   On: 06/03/2015 00:47    Scheduled Meds: . aspirin  81 mg Oral Daily  . cholecalciferol  2,000 Units Oral Daily  . enoxaparin  120 mg Subcutaneous Q24H  . folic acid  1 mg Oral q morning - 10a  . gabapentin  300 mg Oral TID  . HYDROmorphone   Intravenous Q4H  . HYDROmorphone  4 mg Oral Q4H  . ketorolac  30 mg Intravenous Q6H  . lisinopril  10 mg Oral Daily  . metoprolol succinate  50 mg Oral Daily  . morphine  30 mg Oral Q12H  . potassium chloride SA  20 mEq Oral Daily  . senna-docusate  1 tablet Oral BID   Continuous Infusions:    Time spent 20 minurtes

## 2015-06-08 ENCOUNTER — Encounter: Payer: Self-pay | Admitting: *Deleted

## 2015-06-08 DIAGNOSIS — D72829 Elevated white blood cell count, unspecified: Secondary | ICD-10-CM

## 2015-06-08 LAB — CULTURE, BLOOD (ROUTINE X 2): Culture: NO GROWTH

## 2015-06-08 LAB — CBC WITH DIFFERENTIAL/PLATELET
BASOS ABS: 0 10*3/uL (ref 0.0–0.1)
BASOS PCT: 0 %
EOS PCT: 7 %
Eosinophils Absolute: 1.1 10*3/uL — ABNORMAL HIGH (ref 0.0–0.7)
HEMATOCRIT: 19.6 % — AB (ref 39.0–52.0)
Hemoglobin: 6.7 g/dL — CL (ref 13.0–17.0)
LYMPHS ABS: 2.3 10*3/uL (ref 0.7–4.0)
LYMPHS PCT: 15 %
MCH: 31.5 pg (ref 26.0–34.0)
MCHC: 34.2 g/dL (ref 30.0–36.0)
MCV: 92 fL (ref 78.0–100.0)
Monocytes Absolute: 2.1 10*3/uL — ABNORMAL HIGH (ref 0.1–1.0)
Monocytes Relative: 14 %
NEUTROS PCT: 64 %
Neutro Abs: 9.6 10*3/uL — ABNORMAL HIGH (ref 1.7–7.7)
PLATELETS: 467 10*3/uL — AB (ref 150–400)
RBC: 2.13 MIL/uL — AB (ref 4.22–5.81)
RDW: 22.9 % — AB (ref 11.5–15.5)
WBC: 15.1 10*3/uL — AB (ref 4.0–10.5)
nRBC: 8 /100 WBC — ABNORMAL HIGH

## 2015-06-08 LAB — TYPE AND SCREEN
ABO/RH(D): O POS
Antibody Screen: NEGATIVE
UNIT DIVISION: 0

## 2015-06-08 LAB — RETICULOCYTES
RBC.: 2.13 MIL/uL — ABNORMAL LOW (ref 4.22–5.81)
RETIC COUNT ABSOLUTE: 210.9 10*3/uL — AB (ref 19.0–186.0)
Retic Ct Pct: 9.9 % — ABNORMAL HIGH (ref 0.4–3.1)

## 2015-06-08 NOTE — Discharge Summary (Signed)
[name] MRN: [id number] DOB/AGE: 1980-12-15 35 y.o.  Admit date: 06/02/2015 Discharge date: 06/08/2015  Primary Care Physician:  [name], MD   Discharge Diagnoses:   Patient Active Problem List   Diagnosis Date Noted  . Cor pulmonale, chronic ([place]) 08/25/2014    Priority: High  . Pulmonary HTN ([place]) 06/18/2013    Priority: High  . Functional asplenia     Priority: High  . Leukocytosis 02/25/2015    Priority: Medium  . Anemia of chronic disease 06/25/2014    Priority: Medium  . Chronic respiratory failure with hypoxia ([place]) 03/14/2014    Priority: Medium  . Hb-SS disease with crisis ([place]) 01/22/2014    Priority: Medium  . Chronic anticoagulation 08/22/2013    Priority: Medium  . Avascular necrosis ([place])     Priority: Low  . Fever   . Sickle cell anemia with pain ([place]) 05/07/2015  . Sickle cell anemia with crisis ([place]) 04/26/2015  . AKI (acute kidney injury) ([place])   . Hyperkalemia   . Hypoxia   . Leucocytosis   . Sickle-cell disease with pain ([place]) 04/13/2015  . Sickle cell pain crisis ([place]) 03/30/2015  . On home oxygen therapy 02/23/2015  . Anemia 01/31/2015  . Symptomatic anemia   . Syncope 11/30/2014  . Hb-SS disease without crisis ([place]) 10/07/2014  . Acute respiratory failure with hypoxia ([place])   . Acute chest syndrome in sickle crisis ([place])   . PVC's (premature ventricular contractions) 09/10/2014  . Abnormal EKG 09/10/2014  . Hypoxemia   . Chronic atrial fibrillation ([place]) 09/09/2014  . Sickle cell crisis ([place]) 09/08/2014  . Bacteremia   . Elevated troponin I level 08/29/2014  . Ankle edema 07/07/2014  . Sickle cell anemia ([place]) 06/25/2014  . PAH (pulmonary artery hypertension) ([place]) 03/18/2014  . Paralytic strabismus, external ophthalmoplegia   . Chronic pain syndrome 12/12/2013  . Essential hypertension 08/22/2013  . Vitamin D deficiency 02/13/2013  . Embolism, pulmonary with infarction ([place]) 07/09/2012  . Hx of pulmonary embolus  06/29/2012  . Hemochromatosis 12/14/2011    DISCHARGE MEDICATION:   Medication List    TAKE these medications        aspirin 81 MG chewable tablet  Chew 1 tablet (81 mg total) by mouth daily.     cyclobenzaprine 5 MG tablet  Commonly known as:  FLEXERIL  Take 1 tablet (5 mg total) by mouth 3 (three) times daily as needed for muscle spasms.     enoxaparin 120 MG/0.8ML injection  Commonly known as:  LOVENOX  Inject 0.8 mLs (120 mg total) into the skin daily.     folic acid 1 MG tablet  Commonly known as:  FOLVITE  Take 1 tablet (1 mg total) by mouth every morning.     gabapentin 300 MG capsule  Commonly known as:  NEURONTIN  Take 1 capsule (300 mg total) by mouth 3 (three) times daily.     HYDROmorphone 4 MG tablet  Commonly known as:  DILAUDID  Take 1 tablet (4 mg total) by mouth every 4 (four) hours as needed for severe pain.     hydroxyurea 500 MG capsule  Commonly known as:  HYDREA  Take 2 capsules (1,000 mg total) by mouth daily. May take with food to minimize GI side effects.     lisinopril 10 MG tablet  Commonly known as:  PRINIVIL,ZESTRIL  Take 1 tablet (10 mg total) by mouth daily.     metoprolol succinate 50 MG 24 hr tablet  Commonly  known as:  TOPROL-XL  Take 1 tablet (50 mg total) by mouth daily.     morphine 30 MG 12 hr tablet  Commonly known as:  MS CONTIN  Take 1 tablet (30 mg total) by mouth every 12 (twelve) hours.     potassium chloride SA 20 MEQ tablet  Commonly known as:  K-DUR,KLOR-CON  Take 1 tablet (20 mEq total) by mouth every morning.     Vitamin D 2000 units tablet  Take 1 tablet (2,000 Units total) by mouth daily.     zolpidem 10 MG tablet  Commonly known as:  AMBIEN  Take 1 tablet (10 mg total) by mouth at bedtime as needed for sleep.          SIGNIFICANT DIAGNOSTIC STUDIES:  Dg Chest 2 View  06/02/2015  CLINICAL DATA:  Sickle cell anemia. Left-sided chest pain for 1 day. EXAM: CHEST  2 VIEW COMPARISON:  06/01/2015 FINDINGS:  There is an enlarged cardiac silhouette, stable from 06/01/2015. No pleural effusions. No airspace consolidation. Mild vascular prominence, unchanged. Airway is unremarkable. There is a left subclavian Port-A-Cath with tip at the cavoatrial junction. IMPRESSION: Enlarged cardiac silhouette, stable. No interval change from 06/01/2015. Electronically Signed   By: Andreas Newport M.D.   On: 06/02/2015 18:47   Dg Chest 2 View  06/01/2015  CLINICAL DATA:  Rib cage pain and mid back pain. Sickle cell crisis. EXAM: CHEST  2 VIEW COMPARISON:  05/07/2015 FINDINGS: Chronic cardiomegaly. Pulmonary vascularity is normal. No infiltrates or effusions. Power port in place. No bone abnormality. IMPRESSION: No acute abnormality.  Chronic cardiomegaly. Electronically Signed   By: Lorriane Shire M.D.   On: 06/01/2015 10:08   Ct Abdomen Pelvis W Contrast  06/03/2015  CLINICAL DATA:  Fever flank pain and leukocytosis. EXAM: CT ABDOMEN AND PELVIS WITH CONTRAST TECHNIQUE: Multidetector CT imaging of the abdomen and pelvis was performed using the standard protocol following bolus administration of intravenous contrast. CONTRAST:  190mL ISOVUE-300 IOPAMIDOL (ISOVUE-300) INJECTION 61% COMPARISON:  06/01/2009 FINDINGS: Lower chest: Ventriculomegaly, unchanged. Chronic scarring in both lungs. Hepatobiliary: Cholecystectomy. No focal liver lesions. No bile duct dilatation. Pancreas: Normal Spleen: Small and calcified, unchanged. Adrenals/Urinary Tract: The adrenals and kidneys are normal in appearance. There is no urinary calculus evident. There is no hydronephrosis or ureteral dilatation. Collecting systems and ureters appear unremarkable. Stomach/Bowel: There are normal appearances of the stomach, small bowel and colon. The appendix is normal. Vascular/Lymphatic: The abdominal aorta is normal in caliber. There is mild atherosclerotic calcification. There is no pathologically enlarged adenopathy in the abdomen or pelvis. There is a  prominent number of small periaortic nodes, unchanged. Reproductive: Unremarkable Other: No acute inflammatory changes are evident in the abdomen or pelvis. There is no ascites. Musculoskeletal: Multiple bone infarcts. 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 Incidental umbilical hernia, nonobstructive. IMPRESSION: No acute findings are evident in the abdomen or pelvis. 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 Cardiomegaly. The findings are all typical of sickle cell disease. Electronically Signed   By: Andreas Newport M.D.   On: 06/03/2015 00:47      Recent Results (from the past 240 hour(s))  Blood culture (routine x 2)     Status: None   Collection Time: 06/02/15  6:33 PM  Result Value Ref Range Status   Specimen Description BLOOD PORTA CATH  Final   Special Requests BOTTLES DRAWN AEROBIC AND ANAEROBIC 5CC EACH  Final   Culture   Final    NO GROWTH 5  DAYS Performed at [facility]    Report Status 06/07/2015 FINAL  Final  Urine culture     Status: Abnormal   Collection Time: 06/02/15  7:41 PM  Result Value Ref Range Status   Specimen Description URINE, CLEAN CATCH  Final   Special Requests NONE  Final   Culture 30,000 COLONIES/mL ENTEROCOCCUS SPECIES (A)  Final   Report Status 06/05/2015 FINAL  Final   Organism ID, Bacteria ENTEROCOCCUS SPECIES (A)  Final      Susceptibility   Enterococcus species - MIC*    AMPICILLIN <=2 SENSITIVE Sensitive     LEVOFLOXACIN 1 SENSITIVE Sensitive     NITROFURANTOIN <=16 SENSITIVE Sensitive     VANCOMYCIN 2 SENSITIVE Sensitive     * 30,000 COLONIES/mL ENTEROCOCCUS SPECIES  Blood culture (routine x 2)     Status: None (Preliminary result)   Collection Time: 06/02/15  9:52 PM  Result Value Ref Range Status   Specimen Description BLOOD RIGHT HAND  Final   Special Requests IN PEDIATRIC BOTTLE 3CC  Final   Culture   Final    NO GROWTH 4 DAYS Performed at [facility]    Report Status PENDING  Incomplete     BRIEF ADMITTING H & P: [name] is a 35 y.o. male with medical history significant of history of sickle cell anemia, pulmonary hypertension, chronic respiratory failure, history of PE and hypertension presented to the ER because of increasing pain in his mid back with left upper quadrant pain and rib pain over the last 2 days which has been gradually worsening. Denies any nausea vomiting currently, fever chills any headaches focal deficits. The patient is found to be mildly febrile and has hemoglobin is at the baseline. White blood cell count is more than usual. Patient has been admitted for sickle cell pain crisis. Chest x-ray does not show any acute infiltrates.   ED Course: Patient has missed today's on pain medication and empiric antibiotics since patient had mild fever with leukocytosis and hypoxia. Patient is usually chronically hypoxic and uses home oxygen at bedtime.  Hospital Course:  Present on Admission:  . Sickle cell pain crisis [facility]): Patient was managed with IV Dilaudid via PCA, Toradol, IV fluids in addition to continuation of his long-acting MS Contin for management of his chronic pain.. As his pain improved he was transitioned to oral medications and discharged home on prehospital regimen of medications. The patient is to follow-up with his primary care provider as scheduled. . Acute hemolytic anemia in setting of anemia of chronic disease: Has a baseline hemoglobin of 6.5-7. During his hospitalization he had acute hemolysis with his hemoglobin dropping as low as 6.1 Ordinarily I would not transfuse the patient at this level with good reticulocytosis however the patient was also acutely hypoxic at symptomatic from this level of anemia. He was transfused one unit of blood at the time of discharge his hemoglobin was 6.7 g/dL. He has a good reticulocytosis and I expect that his hemoglobin will improve in the next several days . Acute on chronic respiratory failure with hypoxia:  Patient had a decrease in his oxygenation with the concurrent increase in oxygen requirement up to 4 L/m at rest. After transfusion he returned to baseline with the requirement of only 3 L/m of oxygen with ambulation..  . Hx of pulmonary embolus: Patient is continued on Lovenox for chronic anticoagulation.  . Pulmonary HTN ([place]): Patient has a resultant chronic respiratory failure with hypoxemia-see above.  [place] Kitchen  Essential hypertension: Well controlled on current medications  . Chronic pain syndrome: Continue MS Contin  . Leukocytosis: Patient had an elevated white blood cell count without any evidence of infection. It is felt that this is secondary to high bone marrow turnover associated with his anemia in addition to chronic hypoxemia.  . Fever: Pt has a low grade temperature while in the ED. He was empirically started on antibiotics (Vancomycin and Cefepime) which were continued as he had no local evidence of infection and no apparent source. The patient was observed without antibiotics and had no decline in his clinical course. He remained afebrile throughout the entire hospitalization. Of note he had a urine culture which grew 30,000 colonies of enterococcus considered to be a contaminant.  Disposition and Follow-up: Patient is discharged in good condition. At the time of discharge his oxygen requirement is 3 L/m of oxygen with ambulation. He's to follow-up with his primary care physician within one week.   DISCHARGE EXAM:  General: Alert, awake, oriented x3, in mild distress.  Vital signs: BP121/77, HR 71, T98.5 F (36.9 C), temperature source Oral, RR 16, height 6\' 1"  (1.854 m), weight 169 lb 11.2 oz (76.975 kg), SpO2 94 % on 3 L/m of oxygen. HEENT: [place]/AT PEERL, EOMI, anicteric Neck: Trachea midline, no masses, no thyromegal,y no JVD, no carotid bruit OROPHARYNX: Moist, No exudate/ erythema/lesions.  Heart: Regular rate and rhythm, without murmurs, rubs, gallops or S3. PMI non-displaced.  Exam reveals no decreased pulses. Pulmonary/Chest: Normal effort. Breath sounds normal. No. Apnea. Clear to auscultation,no stridor,  no wheezing and no rhonchi noted. No respiratory distress and no tenderness noted. Abdomen: Soft, nontender, nondistended, normal bowel sounds, no masses no hepatosplenomegaly noted. No fluid wave and no ascites. There is no guarding or rebound. Neuro: Alert and oriented to person, place and time. Normal motor skills, Displays no atrophy or tremors and exhibits normal muscle tone.  No focal neurological deficits noted cranial nerves II through XII grossly intact. No sensory deficit noted. Strength at baseline in bilateral upper and lower extremities. Gait normal. Musculoskeletal: No warm swelling or erythema around joints, no spinal tenderness noted. Psychiatric: Patient alert and oriented x3, good insight and cognition, good recent to remote recall. Mood, memory, affect and judgement normal Skin: Skin is warm and dry. No bruising, no ecchymosis and no rash noted. Pt is not diaphoretic. No erythema. No pallor    No results for input(s): NA, K, CL, CO2, GLUCOSE, BUN, CREATININE, CALCIUM, MG, PHOS in the last 72 hours. No results for input(s): AST, ALT, ALKPHOS, BILITOT, PROT, ALBUMIN in the last 72 hours. No results for input(s): LIPASE, AMYLASE in the last 72 hours.    Recent Labs  06/06/15 0500 06/08/15 0546  WBC 18.5* 15.1*  NEUTROABS 11.0* 9.6*  HGB 7.0* 6.7*  HCT 20.4* 19.6*  MCV 90.7 92.0  PLT 492* 467*     Total time spent including face to face and decision making was greater than 30 minutes  Signed: [name],[name] 06/08/2015, 10:03 AM

## 2015-06-08 NOTE — Progress Notes (Addendum)
Patient discharged to home, all copies of discharge medications and instructions reviewed and questions answered.  Patient to declines wheelchair assistance states will ambulate.

## 2015-06-08 NOTE — Progress Notes (Signed)
Critical lab value, hgb= 6.7, on call provider notified and no new orders at this time, will continue to monitor.

## 2015-06-08 NOTE — Progress Notes (Signed)
[facility]   [facility] is providing the following services: home oxygen.  Patient is currently with [place] for home oxygen at 4 lpm.  Has all equipment at home.    If patient discharges after hours, please call [phone].   Linward Headland 06/08/2015, 10:31 AM

## 2015-06-08 NOTE — Progress Notes (Signed)
Patient ambulated on 3L of O2 with sats around 76%, O2 increased to 4L with sats of 83%. At rest sats returned to 95% on 3L.

## 2015-06-09 ENCOUNTER — Encounter: Payer: Self-pay | Admitting: Internal Medicine

## 2015-06-09 ENCOUNTER — Encounter (HOSPITAL_COMMUNITY): Payer: Self-pay | Admitting: *Deleted

## 2015-06-09 ENCOUNTER — Non-Acute Institutional Stay (HOSPITAL_COMMUNITY)
Admission: AD | Admit: 2015-06-09 | Discharge: 2015-06-09 | Disposition: A | Discharge status: Home / Self Care | Payer: Medicare Other | Source: Ambulatory Visit | Attending: Internal Medicine | Admitting: Internal Medicine

## 2015-06-09 ENCOUNTER — Non-Acute Institutional Stay (HOSPITAL_COMMUNITY): Payer: Medicare Other

## 2015-06-09 ENCOUNTER — Ambulatory Visit (INDEPENDENT_AMBULATORY_CARE_PROVIDER_SITE_OTHER): Payer: Medicare Other | Admitting: Internal Medicine

## 2015-06-09 VITALS — BP 113/81 | HR 83 | Temp 99.1°F | Resp 18 | Ht 72.0 in | Wt 171.0 lb

## 2015-06-09 DIAGNOSIS — Z9981 Dependence on supplemental oxygen: Secondary | ICD-10-CM | POA: Insufficient documentation

## 2015-06-09 DIAGNOSIS — R059 Cough, unspecified: Secondary | ICD-10-CM | POA: Insufficient documentation

## 2015-06-09 DIAGNOSIS — Z86711 Personal history of pulmonary embolism: Secondary | ICD-10-CM | POA: Diagnosis not present

## 2015-06-09 DIAGNOSIS — R05 Cough: Secondary | ICD-10-CM | POA: Diagnosis not present

## 2015-06-09 DIAGNOSIS — Z7982 Long term (current) use of aspirin: Secondary | ICD-10-CM | POA: Insufficient documentation

## 2015-06-09 DIAGNOSIS — J069 Acute upper respiratory infection, unspecified: Secondary | ICD-10-CM

## 2015-06-09 DIAGNOSIS — D5700 Hb-SS disease with crisis, unspecified: Secondary | ICD-10-CM | POA: Insufficient documentation

## 2015-06-09 DIAGNOSIS — Z96641 Presence of right artificial hip joint: Secondary | ICD-10-CM | POA: Diagnosis not present

## 2015-06-09 DIAGNOSIS — Z79899 Other long term (current) drug therapy: Secondary | ICD-10-CM | POA: Diagnosis not present

## 2015-06-09 DIAGNOSIS — Z87891 Personal history of nicotine dependence: Secondary | ICD-10-CM | POA: Insufficient documentation

## 2015-06-09 DIAGNOSIS — Z7901 Long term (current) use of anticoagulants: Secondary | ICD-10-CM | POA: Diagnosis not present

## 2015-06-09 DIAGNOSIS — I10 Essential (primary) hypertension: Secondary | ICD-10-CM | POA: Diagnosis not present

## 2015-06-09 DIAGNOSIS — D57819 Other sickle-cell disorders with crisis, unspecified: Secondary | ICD-10-CM | POA: Diagnosis not present

## 2015-06-09 DIAGNOSIS — R0602 Shortness of breath: Secondary | ICD-10-CM | POA: Diagnosis not present

## 2015-06-09 LAB — URINALYSIS, ROUTINE W REFLEX MICROSCOPIC
Bilirubin Urine: NEGATIVE
Glucose, UA: NEGATIVE mg/dL
Ketones, ur: NEGATIVE mg/dL
LEUKOCYTES UA: NEGATIVE
Nitrite: NEGATIVE
PROTEIN: NEGATIVE mg/dL
SPECIFIC GRAVITY, URINE: 1.012 (ref 1.005–1.030)
pH: 6.5 (ref 5.0–8.0)

## 2015-06-09 LAB — URINE MICROSCOPIC-ADD ON

## 2015-06-09 LAB — COMPREHENSIVE METABOLIC PANEL
ALT: 24 U/L (ref 17–63)
ANION GAP: 8 (ref 5–15)
AST: 36 U/L (ref 15–41)
Albumin: 4.2 g/dL (ref 3.5–5.0)
Alkaline Phosphatase: 69 U/L (ref 38–126)
BUN: 9 mg/dL (ref 6–20)
CHLORIDE: 110 mmol/L (ref 101–111)
CO2: 20 mmol/L — ABNORMAL LOW (ref 22–32)
Calcium: 9.4 mg/dL (ref 8.9–10.3)
Creatinine, Ser: 0.63 mg/dL (ref 0.61–1.24)
Glucose, Bld: 99 mg/dL (ref 65–99)
Potassium: 4.1 mmol/L (ref 3.5–5.1)
Sodium: 138 mmol/L (ref 135–145)
Total Bilirubin: 4.4 mg/dL — ABNORMAL HIGH (ref 0.3–1.2)
Total Protein: 7.2 g/dL (ref 6.5–8.1)

## 2015-06-09 LAB — CBC WITH DIFFERENTIAL/PLATELET
Basophils Absolute: 0.1 10*3/uL (ref 0.0–0.1)
Basophils Relative: 1 %
EOS ABS: 0.1 10*3/uL (ref 0.0–0.7)
EOS PCT: 1 %
HCT: 20.3 % — ABNORMAL LOW (ref 39.0–52.0)
HEMOGLOBIN: 7.2 g/dL — AB (ref 13.0–17.0)
LYMPHS PCT: 8 %
Lymphs Abs: 1.1 10*3/uL (ref 0.7–4.0)
MCH: 32 pg (ref 26.0–34.0)
MCHC: 35.5 g/dL (ref 30.0–36.0)
MCV: 90.2 fL (ref 78.0–100.0)
MONO ABS: 1.3 10*3/uL — AB (ref 0.1–1.0)
Monocytes Relative: 10 %
NEUTROS PCT: 80 %
Neutro Abs: 10.7 10*3/uL — ABNORMAL HIGH (ref 1.7–7.7)
PLATELETS: 466 10*3/uL — AB (ref 150–400)
RBC: 2.25 MIL/uL — AB (ref 4.22–5.81)
RDW: 22.1 % — ABNORMAL HIGH (ref 11.5–15.5)
WBC: 13.3 10*3/uL — AB (ref 4.0–10.5)

## 2015-06-09 MED ORDER — POLYETHYLENE GLYCOL 3350 17 G PO PACK
17.0000 g | PACK | Freq: Every day | ORAL | Status: DC | PRN
  Filled 2015-06-09: qty 1

## 2015-06-09 MED ORDER — FOLIC ACID 1 MG PO TABS
1.0000 mg | ORAL_TABLET | Freq: Every day | ORAL | Status: DC
  Administered 2015-06-09: 1 mg via ORAL
  Filled 2015-06-09: qty 1

## 2015-06-09 MED ORDER — HYDROMORPHONE 1 MG/ML IV SOLN
INTRAVENOUS | Status: DC
  Administered 2015-06-09: 13:00:00 via INTRAVENOUS
  Administered 2015-06-09: 10.4 mg via INTRAVENOUS
  Administered 2015-06-09: 5.6 mg via INTRAVENOUS
  Filled 2015-06-09: qty 25

## 2015-06-09 MED ORDER — SODIUM CHLORIDE 0.9 % IV SOLN
12.5000 mg | Freq: Four times a day (QID) | INTRAVENOUS | Status: DC | PRN
  Filled 2015-06-09: qty 0.25

## 2015-06-09 MED ORDER — SENNOSIDES-DOCUSATE SODIUM 8.6-50 MG PO TABS
1.0000 | ORAL_TABLET | Freq: Two times a day (BID) | ORAL | Status: DC
  Administered 2015-06-09: 1 via ORAL
  Filled 2015-06-09: qty 1

## 2015-06-09 MED ORDER — KETOROLAC TROMETHAMINE 30 MG/ML IJ SOLN
30.0000 mg | Freq: Four times a day (QID) | INTRAMUSCULAR | Status: DC
  Administered 2015-06-09: 30 mg via INTRAVENOUS
  Filled 2015-06-09: qty 1

## 2015-06-09 MED ORDER — HEPARIN SOD (PORK) LOCK FLUSH 100 UNIT/ML IV SOLN
500.0000 [IU] | INTRAVENOUS | Status: AC | PRN
  Administered 2015-06-09: 500 [IU]
  Filled 2015-06-09: qty 5

## 2015-06-09 MED ORDER — ONDANSETRON HCL 4 MG/2ML IJ SOLN: 4.0000 mg | Freq: Four times a day (QID) | INTRAMUSCULAR | Status: DC | PRN

## 2015-06-09 MED ORDER — DEXTROSE-NACL 5-0.45 % IV SOLN
INTRAVENOUS | Status: DC
  Administered 2015-06-09: 12:00:00 via INTRAVENOUS

## 2015-06-09 MED ORDER — NALOXONE HCL 0.4 MG/ML IJ SOLN: 0.4000 mg | INTRAMUSCULAR | Status: DC | PRN

## 2015-06-09 MED ORDER — SODIUM CHLORIDE 0.9% FLUSH
10.0000 mL | INTRAVENOUS | Status: AC | PRN
  Administered 2015-06-09: 10 mL

## 2015-06-09 MED ORDER — SODIUM CHLORIDE 0.9% FLUSH: 9.0000 mL | INTRAVENOUS | Status: DC | PRN

## 2015-06-09 MED ORDER — DIPHENHYDRAMINE HCL 12.5 MG/5ML PO ELIX: 12.5000 mg | ORAL_SOLUTION | Freq: Four times a day (QID) | ORAL | Status: DC | PRN

## 2015-06-09 NOTE — Discharge Summary (Signed)
Physician Discharge Summary  [name] [id number] DOB: 1980/11/23 DOA: 06/09/2015  PCP: [name], MD  Admit date: 06/09/2015  Discharge date: 06/09/2015  Time spent: 30 minutes  Discharge Diagnoses:  Active Problems:   Sickle cell anemia with pain (HCC)   Cough   Discharge Condition: Stable  Diet recommendation: Regular  Filed Weights   06/09/15 1223  Weight: 169 lb (76.658 kg)    History of present illness:   [name] is a 35 y.o. male with history of sickle cell anemia, pulmonary hypertension, chronic respiratory failure on home oxygen, history of PE on chronic anticoagulation, and hypertension presented initially to the outpatient clinic today for hospital follow-up but complaining of severe sickle cell pain, rated at 8 out of 10 with other symptoms of upper respiratory infection including cough and runny nose and would like to be admitted for observation and extended pain management. Patient was admitted to the hospital on 06/02/2015 and was discharged only yesterday for the same sickle cell anemia with crisis. During his hospitalization, patient was transfused with 1 unit of packed red blood cell for low hemoglobin. Patient follows up regularly with pulmonologist and cardiologist for his severe pulmonary hypertension. Today he has no fever, he denies any chest pain, he denies shortness of breath, no leg swelling.  Hospital Course:   [name] was admitted to the day hospital with sickle cell painful crisis. Patient was treated with weight based IV Dilaudid PCA, IV Toradol as well as IV fluids. [place] showed significant improvement symptomatically and pain went down from 8 to 4 out of 10 at the time of discharge. [name] will follow-up in the clinic as previously scheduled, continue with home medications as per prior to admission.  Discharge Exam: Filed Vitals:   06/09/15 1540 06/09/15 1619  BP:  100/58  Pulse:  79  Temp:    Resp: 18 14    General appearance: alert, cooperative and no distress Eyes: conjunctivae/corneas clear. PERRL, EOM's intact. Fundi benign. Neck: no adenopathy, no carotid bruit, no JVD, supple, symmetrical, trachea midline and thyroid not enlarged, symmetric, no tenderness/mass/nodules Back: symmetric, no curvature. ROM normal. No CVA tenderness. Resp: clear to auscultation bilaterally Cardio: irregular rate and rhythm, Loud P2, + murmur, No click, rub or gallop GI: soft, non-tender; bowel sounds normal; no masses, no organomegaly Extremities: extremities normal, atraumatic, no cyanosis or edema Pulses: 2+ and symmetric Skin: Skin color, texture, turgor normal. No rashes or lesions Neurologic: Grossly normal  Discharge Instructions  We discussed the need for good hydration, monitoring of hydration status, avoidance of heat, cold, stress, and infection triggers. We discussed the need to be compliant with taking Hydrea. [name] was reminded of the need to seek medical attention of any symptoms of bleeding, anemia, or infection occurs.   Current Discharge Medication List    CONTINUE these medications which have NOT CHANGED   Details  aspirin 81 MG chewable tablet Chew 1 tablet (81 mg total) by mouth daily. Qty: 30 tablet, Refills: 11    Cholecalciferol (VITAMIN D) 2000 units tablet Take 1 tablet (2,000 Units total) by mouth daily. Qty: 90 tablet, Refills: 3   Associated Diagnoses: Vitamin D deficiency    cyclobenzaprine (FLEXERIL) 5 MG tablet Take 1 tablet (5 mg total) by mouth 3 (three) times daily as needed for muscle spasms. Qty: 20 tablet, Refills: 0    enoxaparin (LOVENOX) 120 MG/0.8ML injection Inject 0.8 mLs (120 mg total) into the skin daily. Qty: 30 Syringe, Refills: 2  folic acid (FOLVITE) 1 MG tablet Take 1 tablet (1 mg total) by mouth every morning. Qty: 30 tablet, Refills: 11   Associated Diagnoses: Hb-SS disease without crisis ([place])    gabapentin (NEURONTIN) 300 MG capsule  Take 1 capsule (300 mg total) by mouth 3 (three) times daily. Qty: 270 capsule, Refills: 3    HYDROmorphone (DILAUDID) 4 MG tablet Take 1 tablet (4 mg total) by mouth every 4 (four) hours as needed for severe pain. Qty: 90 tablet, Refills: 0   Associated Diagnoses: Sickle-cell disease with pain ([place]); Chronic pain syndrome    hydroxyurea (HYDREA) 500 MG capsule Take 2 capsules (1,000 mg total) by mouth daily. May take with food to minimize GI side effects. Qty: 60 capsule, Refills: 5   Associated Diagnoses: Hb-SS disease without crisis (HCC)    lisinopril (PRINIVIL,ZESTRIL) 10 MG tablet Take 1 tablet (10 mg total) by mouth daily. Qty: 30 tablet, Refills: 5   Associated Diagnoses: Hb-SS disease without crisis ([place]); Essential hypertension    metoprolol succinate (TOPROL-XL) 50 MG 24 hr tablet Take 1 tablet (50 mg total) by mouth daily. Qty: 30 tablet, Refills: 3   Associated Diagnoses: Chronic atrial fibrillation ([place]); Pulmonary HTN (HCC)    morphine (MS CONTIN) 30 MG 12 hr tablet Take 1 tablet (30 mg total) by mouth every 12 (twelve) hours. Qty: 60 tablet, Refills: 0   Associated Diagnoses: Chronic pain syndrome    potassium chloride SA (K-DUR,KLOR-CON) 20 MEQ tablet Take 1 tablet (20 mEq total) by mouth every morning. Qty: 30 tablet, Refills: 3   Associated Diagnoses: Pulmonary HTN (HCC)    zolpidem (AMBIEN) 10 MG tablet Take 1 tablet (10 mg total) by mouth at bedtime as needed for sleep. Qty: 30 tablet, Refills: 0   Associated Diagnoses: Insomnia       No Known Allergies   Significant Diagnostic Studies: Dg Chest 2 View  06/09/2015  CLINICAL DATA:  Cough and body aches for the past 2 days, no shortness of breath, history of pulmonary embolism, sickle cell anemia EXAM: CHEST  2 VIEW COMPARISON:  PA and lateral chest x-ray of Jun 02, 2015 FINDINGS: The lungs are adequately inflated. The cardiopericardial silhouette remains enlarged. The pulmonary vascularity remains engorged but  is slightly less the bony thorax exhibits no acute abnormality. There IMPRESSION: COPD, low-grade CHF. No alveolar pneumonia. If the patient has elevated white blood cell count or fever, follow-up PA and lateral chest X-ray is recommended in 3-4 weeks following trial of antibiotic therapy. Electronically Signed   By: David  [place] M.D.   On: 06/09/2015 12:55   Dg Chest 2 View  06/02/2015  CLINICAL DATA:  Sickle cell anemia. Left-sided chest pain for 1 day. EXAM: CHEST  2 VIEW COMPARISON:  06/01/2015 FINDINGS: There is an enlarged cardiac silhouette, stable from 06/01/2015. No pleural effusions. No airspace consolidation. Mild vascular prominence, unchanged. Airway is unremarkable. There is a left subclavian Port-A-Cath with tip at the cavoatrial junction. IMPRESSION: Enlarged cardiac silhouette, stable. No interval change from 06/01/2015. Electronically Signed   By: Andreas Newport M.D.   On: 06/02/2015 18:47   Dg Chest 2 View  06/01/2015  CLINICAL DATA:  Rib cage pain and mid back pain. Sickle cell crisis. EXAM: CHEST  2 VIEW COMPARISON:  05/07/2015 FINDINGS: Chronic cardiomegaly. Pulmonary vascularity is normal. No infiltrates or effusions. Power port in place. No bone abnormality. IMPRESSION: No acute abnormality.  Chronic cardiomegaly. Electronically Signed   By: Lorriane Shire M.D.   On: 06/01/2015 10:08  Ct Abdomen Pelvis W Contrast  06/03/2015  CLINICAL DATA:  Fever flank pain and leukocytosis. EXAM: CT ABDOMEN AND PELVIS WITH CONTRAST TECHNIQUE: Multidetector CT imaging of the abdomen and pelvis was performed using the standard protocol following bolus administration of intravenous contrast. CONTRAST:  147mL ISOVUE-300 IOPAMIDOL (ISOVUE-300) INJECTION 61% COMPARISON:  06/01/2009 FINDINGS: Lower chest: Ventriculomegaly, unchanged. Chronic scarring in both lungs. Hepatobiliary: Cholecystectomy. No focal liver lesions. No bile duct dilatation. Pancreas: Normal Spleen: Small and calcified, unchanged.  Adrenals/Urinary Tract: The adrenals and kidneys are normal in appearance. There is no urinary calculus evident. There is no hydronephrosis or ureteral dilatation. Collecting systems and ureters appear unremarkable. Stomach/Bowel: There are normal appearances of the stomach, small bowel and colon. The appendix is normal. Vascular/Lymphatic: The abdominal aorta is normal in caliber. There is mild atherosclerotic calcification. There is no pathologically enlarged adenopathy in the abdomen or pelvis. There is a prominent number of small periaortic nodes, unchanged. Reproductive: Unremarkable Other: No acute inflammatory changes are evident in the abdomen or pelvis. There is no ascites. Musculoskeletal: Multiple bone infarcts. 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 Incidental umbilical hernia, nonobstructive. IMPRESSION: No acute findings are evident in the abdomen or pelvis. 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 Cardiomegaly. The findings are all typical of sickle cell disease. Electronically Signed   By: Andreas Newport M.D.   On: 06/03/2015 00:47    Signed:  [name] MD, MHA, [place], [place], CPE   06/09/2015, 5:19 PM

## 2015-06-09 NOTE — Discharge Summary (Signed)
Physician Discharge Summary  [name] [id number] DOB: [birth year] DOA: 06/09/2015  PCP: [name], MD  Admit date: 06/09/2015  Discharge date: 06/09/2015  Time spent: 30 minutes  Discharge Diagnoses:  Active Problems:   Sickle cell anemia with pain (HCC)   Cough   Discharge Condition: Stable  Diet recommendation: Regular  Filed Weights   06/09/15 1223  Weight: 169 lb (76.658 kg)    History of present illness:  [name] is a 35 y.o. male with history of sickle cell anemia, pulmonary hypertension, chronic respiratory failure on home oxygen, history of PE on chronic anticoagulation, and hypertension presented initially to the outpatient clinic today for hospital follow-up but complaining of severe sickle cell pain, rated at 8 out of 10 with other symptoms of upper respiratory infection including cough and runny nose and would like to be admitted for observation and extended pain management. Patient was admitted to the hospital on 06/02/2015 and was discharged only yesterday for the same sickle cell anemia with crisis. During his hospitalization, patient was transfused with 1 unit of packed red blood cell for low hemoglobin. Patient follows up regularly with pulmonologist and cardiologist for his severe pulmonary hypertension. Today he has no fever, he denies any chest pain, he denies shortness of breath, no leg swelling.  Hospital Course:   [name] was admitted to the day hospital with sickle cell painful crisis. Patient was treated with weight based IV Dilaudid PCA, IV Toradol as well as IV fluids. [name] showed significant improvement symptomatically and pain went down from 8 to 4 out of 10 at the time of discharge. [name] will follow-up in the clinic as previously scheduled, continue with home medications as per prior to admission.  Discharge Exam: Filed Vitals:   06/09/15 1540 06/09/15 1619  BP:  100/58  Pulse:  79  Temp:    Resp: 18 14    General appearance: alert, cooperative and no distress Eyes: conjunctivae/corneas clear. PERRL, EOM's intact. Fundi benign. Neck: no adenopathy, no carotid bruit, no JVD, supple, symmetrical, trachea midline and thyroid not enlarged, symmetric, no tenderness/mass/nodules Back: symmetric, no curvature. ROM normal. No CVA tenderness. Resp: clear to auscultation bilaterally Chest wall: no tenderness Cardio: Irregular rate and rhythm, Loud P2, +murmur, No click, rub or gallop GI: soft, non-tender; bowel sounds normal; no masses, no organomegaly Extremities: extremities normal, atraumatic, no cyanosis or edema Pulses: 2+ and symmetric Skin: Skin color, texture, turgor normal. No rashes or lesions Neurologic: Grossly normal  Discharge Instructions  We discussed the need for good hydration, monitoring of hydration status, avoidance of heat, cold, stress, and infection triggers. We discussed the need to be compliant with taking Hydrea. [name] was reminded of the need to seek medical attention of any symptoms of bleeding, anemia, or infection occurs.   Current Discharge Medication List    CONTINUE these medications which have NOT CHANGED   Details  aspirin 81 MG chewable tablet Chew 1 tablet (81 mg total) by mouth daily. Qty: 30 tablet, Refills: 11    Cholecalciferol (VITAMIN D) 2000 units tablet Take 1 tablet (2,000 Units total) by mouth daily. Qty: 90 tablet, Refills: 3   Associated Diagnoses: Vitamin D deficiency    cyclobenzaprine (FLEXERIL) 5 MG tablet Take 1 tablet (5 mg total) by mouth 3 (three) times daily as needed for muscle spasms. Qty: 20 tablet, Refills: 0    enoxaparin (LOVENOX) 120 MG/0.8ML injection Inject 0.8 mLs (120 mg total) into the skin daily. Qty: 30 Syringe, Refills:  2    folic acid (FOLVITE) 1 MG tablet Take 1 tablet (1 mg total) by mouth every morning. Qty: 30 tablet, Refills: 11   Associated Diagnoses: Hb-SS disease without crisis ([place])    gabapentin  (NEURONTIN) 300 MG capsule Take 1 capsule (300 mg total) by mouth 3 (three) times daily. Qty: 270 capsule, Refills: 3    HYDROmorphone (DILAUDID) 4 MG tablet Take 1 tablet (4 mg total) by mouth every 4 (four) hours as needed for severe pain. Qty: 90 tablet, Refills: 0   Associated Diagnoses: Sickle-cell disease with pain ([place]); Chronic pain syndrome    hydroxyurea (HYDREA) 500 MG capsule Take 2 capsules (1,000 mg total) by mouth daily. May take with food to minimize GI side effects. Qty: 60 capsule, Refills: 5   Associated Diagnoses: Hb-SS disease without crisis (HCC)    lisinopril (PRINIVIL,ZESTRIL) 10 MG tablet Take 1 tablet (10 mg total) by mouth daily. Qty: 30 tablet, Refills: 5   Associated Diagnoses: Hb-SS disease without crisis ([place]); Essential hypertension    metoprolol succinate (TOPROL-XL) 50 MG 24 hr tablet Take 1 tablet (50 mg total) by mouth daily. Qty: 30 tablet, Refills: 3   Associated Diagnoses: Chronic atrial fibrillation ([place]); Pulmonary HTN (HCC)    morphine (MS CONTIN) 30 MG 12 hr tablet Take 1 tablet (30 mg total) by mouth every 12 (twelve) hours. Qty: 60 tablet, Refills: 0   Associated Diagnoses: Chronic pain syndrome    potassium chloride SA (K-DUR,KLOR-CON) 20 MEQ tablet Take 1 tablet (20 mEq total) by mouth every morning. Qty: 30 tablet, Refills: 3   Associated Diagnoses: Pulmonary HTN (HCC)    zolpidem (AMBIEN) 10 MG tablet Take 1 tablet (10 mg total) by mouth at bedtime as needed for sleep. Qty: 30 tablet, Refills: 0   Associated Diagnoses: Insomnia       No Known Allergies   Significant Diagnostic Studies: Dg Chest 2 View  06/09/2015  CLINICAL DATA:  Cough and body aches for the past 2 days, no shortness of breath, history of pulmonary embolism, sickle cell anemia EXAM: CHEST  2 VIEW COMPARISON:  PA and lateral chest x-ray of Jun 02, 2015 FINDINGS: The lungs are adequately inflated. The cardiopericardial silhouette remains enlarged. The pulmonary  vascularity remains engorged but is slightly less the bony thorax exhibits no acute abnormality. There IMPRESSION: COPD, low-grade CHF. No alveolar pneumonia. If the patient has elevated white blood cell count or fever, follow-up PA and lateral chest X-ray is recommended in 3-4 weeks following trial of antibiotic therapy. Electronically Signed   By: David  Martinique M.D.   On: 06/09/2015 12:55   Dg Chest 2 View  06/02/2015  CLINICAL DATA:  Sickle cell anemia. Left-sided chest pain for 1 day. EXAM: CHEST  2 VIEW COMPARISON:  06/01/2015 FINDINGS: There is an enlarged cardiac silhouette, stable from 06/01/2015. No pleural effusions. No airspace consolidation. Mild vascular prominence, unchanged. Airway is unremarkable. There is a left subclavian Port-A-Cath with tip at the cavoatrial junction. IMPRESSION: Enlarged cardiac silhouette, stable. No interval change from 06/01/2015. Electronically Signed   By: Andreas Newport M.D.   On: 06/02/2015 18:47   Dg Chest 2 View  06/01/2015  CLINICAL DATA:  Rib cage pain and mid back pain. Sickle cell crisis. EXAM: CHEST  2 VIEW COMPARISON:  05/07/2015 FINDINGS: Chronic cardiomegaly. Pulmonary vascularity is normal. No infiltrates or effusions. Power port in place. No bone abnormality. IMPRESSION: No acute abnormality.  Chronic cardiomegaly. Electronically Signed   By: Lorriane Shire M.D.  On: 06/01/2015 10:08   Ct Abdomen Pelvis W Contrast  06/03/2015  CLINICAL DATA:  Fever flank pain and leukocytosis. EXAM: CT ABDOMEN AND PELVIS WITH CONTRAST TECHNIQUE: Multidetector CT imaging of the abdomen and pelvis was performed using the standard protocol following bolus administration of intravenous contrast. CONTRAST:  179mL ISOVUE-300 IOPAMIDOL (ISOVUE-300) INJECTION 61% COMPARISON:  06/01/2009 FINDINGS: Lower chest: Ventriculomegaly, unchanged. Chronic scarring in both lungs. Hepatobiliary: Cholecystectomy. No focal liver lesions. No bile duct dilatation. Pancreas: Normal Spleen:  Small and calcified, unchanged. Adrenals/Urinary Tract: The adrenals and kidneys are normal in appearance. There is no urinary calculus evident. There is no hydronephrosis or ureteral dilatation. Collecting systems and ureters appear unremarkable. Stomach/Bowel: There are normal appearances of the stomach, small bowel and colon. The appendix is normal. Vascular/Lymphatic: The abdominal aorta is normal in caliber. There is mild atherosclerotic calcification. There is no pathologically enlarged adenopathy in the abdomen or pelvis. There is a prominent number of small periaortic nodes, unchanged. Reproductive: Unremarkable Other: No acute inflammatory changes are evident in the abdomen or pelvis. There is no ascites. Musculoskeletal: Multiple bone infarcts. 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 Incidental umbilical hernia, nonobstructive. IMPRESSION: No acute findings are evident in the abdomen or pelvis. 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 Cardiomegaly. The findings are all typical of sickle cell disease. Electronically Signed   By: Andreas Newport M.D.   On: 06/03/2015 00:47    Signed:  [name] MD, [place], [place], [place], CPE   06/09/2015, 5:06 PM

## 2015-06-09 NOTE — H&P (Signed)
[facility] History and Physical  [name] [id number] DOB: Jun 03, 1980 DOA: 06/09/2015  PCP: [name], MD   Chief Complaint: No chief complaint on file.   HPI: [name] is a 35 y.o. male with history of sickle cell anemia, pulmonary hypertension, chronic respiratory failure on home oxygen, history of PE on chronic anticoagulation, and hypertension presented initially to the outpatient clinic today for hospital follow-up but complaining of severe sickle cell pain, rated at 8 out of 10 with other symptoms of upper respiratory infection including cough and runny nose and would like to be admitted for observation and extended pain management. Patient was admitted to the hospital on 06/02/2015 and was discharged only yesterday for the same sickle cell anemia with crisis. During his hospitalization, patient was transfused with 1 unit of packed red blood cell for low hemoglobin. Patient follows up regularly with pulmonologist and cardiologist for his severe pulmonary hypertension. Today he has no fever, he denies any chest pain, he denies shortness of breath, no leg swelling.  Systemic Review: General: The patient denies anorexia, fever, weight loss Cardiac: Denies chest pain, syncope, palpitations, pedal edema  Respiratory: ++cough, denies shortness of breath, no wheezing GI: Denies severe indigestion/heartburn, abdominal pain, nausea, vomiting, diarrhea and constipation GU: Denies hematuria, incontinence, dysuria  Musculoskeletal: Denies arthritis  Skin: Denies suspicious skin lesions Neurologic: Denies focal weakness or numbness, change in vision  Past Medical History  Diagnosis Date  . Sickle cell anemia (HCC)   . Blood transfusion   . Acute embolism and thrombosis of right internal jugular vein ([place])   . Hypokalemia   . Mood disorder ([place])   . History of pulmonary embolus (PE)   . Avascular necrosis (HCC)     Right Hip  . Leukocytosis    Chronic  . Thrombocytosis (HCC)     Chronic  . Hypertension   . History of Clostridium difficile infection   . Uses marijuana   . Chronic anticoagulation   . Functional asplenia   . Former smoker   . Second hand tobacco smoke exposure   . Alcohol consumption of one to four drinks per day   . Noncompliance with medication regimen   . Sickle-cell crisis with associated acute chest syndrome ([place]) 05/13/2013  . Acute chest syndrome ([place]) 06/18/2013  . Demand ischemia ([place]) 01/02/2014  . Pulmonary hypertension ([place])   . Hb-SS disease with crisis ([place])   . Oxygen deficiency     Past Surgical History  Procedure Laterality Date  . Right hip replacement      08/2006  . Cholecystectomy      01/2008  . Porta cath placement    . Porta cath removal    . Umbilical hernia repair      01/2008  . Excision of left periauricular cyst      10/2009  . Excision of right ear lobe cyst with primary closur      11/2007  . Portacath placement  01/05/2012    Procedure: INSERTION PORT-A-CATH;  Surgeon: Odis Hollingshead, MD;  Location: [place];  Service: General;  Laterality: N/A;  ultrasound guiced port a cath insertion with fluoroscopy    No Known Allergies  Family History  Problem Relation Age of Onset  . Sickle cell trait Mother   . Depression Mother   . Diabetes Mother   . Sickle cell trait Father   . Sickle cell trait Brother       Prior to Admission medications  Medication Sig Start Date End Date Taking? Authorizing Provider  aspirin 81 MG chewable tablet Chew 1 tablet (81 mg total) by mouth daily. 07/23/14   [name], MD  Cholecalciferol (VITAMIN D) 2000 units tablet Take 1 tablet (2,000 Units total) by mouth daily. 02/23/15   Dorena Dew, FNP  cyclobenzaprine (FLEXERIL) 5 MG tablet Take 1 tablet (5 mg total) by mouth 3 (three) times daily as needed for muscle spasms. 02/19/15   Alfonzo Beers, MD  enoxaparin (LOVENOX) 120 MG/0.8ML injection Inject 0.8 mLs (120 mg total) into  the skin daily. 12/03/14   [name], MD  folic acid (FOLVITE) 1 MG tablet Take 1 tablet (1 mg total) by mouth every morning. 02/23/15   Dorena Dew, FNP  gabapentin (NEURONTIN) 300 MG capsule Take 1 capsule (300 mg total) by mouth 3 (three) times daily. 06/01/15   Tresa Garter, MD  HYDROmorphone (DILAUDID) 4 MG tablet Take 1 tablet (4 mg total) by mouth every 4 (four) hours as needed for severe pain. 06/01/15   Tresa Garter, MD  hydroxyurea (HYDREA) 500 MG capsule Take 2 capsules (1,000 mg total) by mouth daily. May take with food to minimize GI side effects. 06/01/15   Tresa Garter, MD  lisinopril (PRINIVIL,ZESTRIL) 10 MG tablet Take 1 tablet (10 mg total) by mouth daily. 04/13/15   Dorena Dew, FNP  metoprolol succinate (TOPROL-XL) 50 MG 24 hr tablet Take 1 tablet (50 mg total) by mouth daily. 02/23/15   Dorena Dew, FNP  morphine (MS CONTIN) 30 MG 12 hr tablet Take 1 tablet (30 mg total) by mouth every 12 (twelve) hours. 06/01/15   Tresa Garter, MD  potassium chloride SA (K-DUR,KLOR-CON) 20 MEQ tablet Take 1 tablet (20 mEq total) by mouth every morning. Patient taking differently: Take 20 mEq by mouth daily.  04/13/15   Dorena Dew, FNP  zolpidem (AMBIEN) 10 MG tablet Take 1 tablet (10 mg total) by mouth at bedtime as needed for sleep. 06/01/15   Tresa Garter, MD     Physical Exam: There were no vitals filed for this visit.  General: Alert, awake, afebrile, anicteric, not in obvious distress HEENT: Normocephalic and Atraumatic, Mucous membranes pink                PERRLA; EOM intact; No scleral icterus,                 Nares: Patent, Oropharynx: Clear, Fair Dentition                 Neck: FROM, no cervical lymphadenopathy, thyromegaly, carotid bruit or JVD;  CHEST: Normal respiration, clear to auscultation bilaterally  HEART: Irregular rate and rhythm; Loud P2, + murmurs. No rubs or gallops  BACK: No kyphosis or scoliosis; no CVA  tenderness  ABDOMEN: Positive Bowel Sounds, soft, non-tender; no masses, no organomegaly EXTREMITIES: No cyanosis, clubbing, or edema SKIN:  no rash or ulceration  CNS: Alert and Oriented x 4, Nonfocal exam, CN 2-12 intact  Labs on Admission:  Basic Metabolic Panel:  Recent Labs Lab 06/02/15 1836 06/03/15 0450 06/05/15 0615  NA 136 137 137  K 4.3 4.3 4.5  CL 110 110 110  CO2 19* 19* 19*  GLUCOSE 124* 115* 120*  BUN 18 10 14   CREATININE 1.02 0.82 0.78  CALCIUM 8.6* 8.9 9.1   Liver Function Tests:  Recent Labs Lab 06/02/15 1836  AST 41  ALT 19  ALKPHOS 70  BILITOT 4.7*  PROT 7.3  ALBUMIN 4.1   No results for input(s): LIPASE, AMYLASE in the last 168 hours. No results for input(s): AMMONIA in the last 168 hours. CBC:  Recent Labs Lab 06/02/15 1833 06/03/15 0450 06/05/15 0615 06/06/15 0500 06/08/15 0546  WBC 17.0* 20.4* 19.1* 18.5* 15.1*  NEUTROABS 11.6* 12.6* 12.3* 11.0* 9.6*  HGB 6.1* 6.5* 6.1* 7.0* 6.7*  HCT 17.2* 18.6* 17.6* 20.4* 19.6*  MCV 86.4 88.6 90.7 90.7 92.0  PLT 528* 544* 463* 492* 467*   Cardiac Enzymes:  Recent Labs Lab 06/02/15 2215  TROPONINI 0.05*    BNP (last 3 results)  Recent Labs  08/29/14 0810 09/10/14 0612 11/30/14 0910  BNP 1117.0* 549.5* 639.7*    ProBNP (last 3 results) No results for input(s): PROBNP in the last 8760 hours.  CBG: No results for input(s): GLUCAP in the last 168 hours.   Assessment/Plan Active Problems:   Sickle cell anemia with pain (HCC)   Cough   Admits to the Day Hospital  IVF D5 .45% Saline @ 125 mls/hour  Weight based Dilaudid PCA started within 30 minutes of admission  IV Toradol 30 mg Q 6 H  Monitor vitals very closely, Re-evaluate pain scale every hour  Oxygen by [place] 3 L  Patient will be re-evaluated for pain in the context of function and relationship to baseline as care progresses.  If no significant relieve from pain (remains above 5/10) will transfer patient to  inpatient services for further evaluation and management  Code Status: Full  Family Communication: None  DVT Prophylaxis: Ambulate as tolerated   Time spent: 61 Minutes  Tifini Reeder, MD, MHA, FACP, FAAP, CPE  If 7PM-7AM, please contact night-coverage [email or web address] 06/09/2015, 12:13 PM

## 2015-06-09 NOTE — Progress Notes (Signed)
Patient is here for FU  Patient complains of chronic lower back and leg pain. Scaled currently at a 7.  Patient has taken medication today.

## 2015-06-09 NOTE — Patient Instructions (Signed)
Sickle Cell Anemia, Adult Sickle cell anemia is a condition in which red blood cells have an abnormal "sickle" shape. This abnormal shape shortens the cells' life span, which results in a lower than normal concentration of red blood cells in the blood. The sickle shape also causes the cells to clump together and block free blood flow through the blood vessels. As a result, the tissues and organs of the body do not receive enough oxygen. Sickle cell anemia causes organ damage and pain and increases the risk of infection. CAUSES  Sickle cell anemia is a genetic disorder. Those who receive two copies of the gene have the condition, and those who receive one copy have the trait. RISK FACTORS The sickle cell gene is most common in people whose families originated in [place]. Other areas of the globe where sickle cell trait occurs include the Mediterranean, [place] and [place], the [place], and the [place].  SIGNS AND SYMPTOMS  Pain, especially in the extremities, back, chest, or abdomen (common). The pain may start suddenly or may develop following an illness, especially if there is dehydration. Pain can also occur due to overexertion or exposure to extreme temperature changes.  Frequent severe bacterial infections, especially certain types of pneumonia and meningitis.  Pain and swelling in the hands and feet.  Decreased activity.   Loss of appetite.   Change in behavior.  Headaches.  Seizures.  Shortness of breath or difficulty breathing.  Vision changes.  Skin ulcers. Those with the trait may not have symptoms or they may have mild symptoms.  DIAGNOSIS  Sickle cell anemia is diagnosed with blood tests that demonstrate the genetic trait. It is often diagnosed during the newborn period, due to mandatory testing nationwide. A variety of blood tests, X-rays, CT scans, MRI scans, ultrasounds, and lung function tests may also be done to monitor the condition. TREATMENT  Sickle  cell anemia may be treated with:  Medicines. You may be given pain medicines, antibiotic medicines (to treat and prevent infections) or medicines to increase the production of certain types of hemoglobin.  Fluids.  Oxygen.  Blood transfusions. HOME CARE INSTRUCTIONS   Drink enough fluid to keep your urine clear or pale yellow. Increase your fluid intake in hot weather and during exercise.  Do not smoke. Smoking lowers oxygen levels in the blood.   Only take over-the-counter or prescription medicines for pain, fever, or discomfort as directed by your health care provider.  Take antibiotics as directed by your health care provider. Make sure you finish them it even if you start to feel better.   Take supplements as directed by your health care provider.   Consider wearing a medical alert bracelet. This tells anyone caring for you in an emergency of your condition.   When traveling, keep your medical information, health care provider's names, and the medicines you take with you at all times.   If you develop a fever, do not take medicines to reduce the fever right away. This could cover up a problem that is developing. Notify your health care provider.  Keep all follow-up appointments with your health care provider. Sickle cell anemia requires regular medical care. SEEK MEDICAL CARE IF: You have a fever. SEEK IMMEDIATE MEDICAL CARE IF:   You feel dizzy or faint.   You have new abdominal pain, especially on the left side near the stomach area.   You develop a persistent, often uncomfortable and painful penile erection (priapism). If this is not treated immediately it   will lead to impotence.   You have numbness your arms or legs or you have a hard time moving them.   You have a hard time with speech.   You have a fever or persistent symptoms for more than 2-3 days.   You have a fever and your symptoms suddenly get worse.   You have signs or symptoms of infection.  These include:   Chills.   Abnormal tiredness (lethargy).   Irritability.   Poor eating.   Vomiting.   You develop pain that is not helped with medicine.   You develop shortness of breath.  You have pain in your chest.   You are coughing up pus-like or bloody sputum.   You develop a stiff neck.  Your feet or hands swell or have pain.  Your abdomen appears bloated.  You develop joint pain. MAKE SURE YOU:  Understand these instructions.   This information is not intended to replace advice given to you by your health care provider. Make sure you discuss any questions you have with your health care provider.   Document Released: 04/27/2005 Document Revised: 02/07/2014 Document Reviewed: 08/29/2012 Elsevier Interactive Patient Education 2016 [employer]  

## 2015-06-09 NOTE — Discharge Instructions (Signed)
Sickle Cell Anemia, Adult Sickle cell anemia is a condition in which red blood cells have an abnormal "sickle" shape. This abnormal shape shortens the cells' life span, which results in a lower than normal concentration of red blood cells in the blood. The sickle shape also causes the cells to clump together and block free blood flow through the blood vessels. As a result, the tissues and organs of the body do not receive enough oxygen. Sickle cell anemia causes organ damage and pain and increases the risk of infection. CAUSES  Sickle cell anemia is a genetic disorder. Those who receive two copies of the gene have the condition, and those who receive one copy have the trait. RISK FACTORS The sickle cell gene is most common in people whose families originated in [place]. Other areas of the globe where sickle cell trait occurs include the [place], [place] and [place], the [place], and the [place].  SIGNS AND SYMPTOMS  Pain, especially in the extremities, back, chest, or abdomen (common). The pain may start suddenly or may develop following an illness, especially if there is dehydration. Pain can also occur due to overexertion or exposure to extreme temperature changes.  Frequent severe bacterial infections, especially certain types of pneumonia and meningitis.  Pain and swelling in the hands and feet.  Decreased activity.   Loss of appetite.   Change in behavior.  Headaches.  Seizures.  Shortness of breath or difficulty breathing.  Vision changes.  Skin ulcers. Those with the trait may not have symptoms or they may have mild symptoms.  DIAGNOSIS  Sickle cell anemia is diagnosed with blood tests that demonstrate the genetic trait. It is often diagnosed during the newborn period, due to mandatory testing nationwide. A variety of blood tests, X-rays, CT scans, MRI scans, ultrasounds, and lung function tests may also be done to monitor the condition. TREATMENT  Sickle  cell anemia may be treated with:  Medicines. You may be given pain medicines, antibiotic medicines (to treat and prevent infections) or medicines to increase the production of certain types of hemoglobin.  Fluids.  Oxygen.  Blood transfusions. HOME CARE INSTRUCTIONS   Drink enough fluid to keep your urine clear or pale yellow. Increase your fluid intake in hot weather and during exercise.  Do not smoke. Smoking lowers oxygen levels in the blood.   Only take over-the-counter or prescription medicines for pain, fever, or discomfort as directed by your health care provider.  Take antibiotics as directed by your health care provider. Make sure you finish them it even if you start to feel better.   Take supplements as directed by your health care provider.   Consider wearing a medical alert bracelet. This tells anyone caring for you in an emergency of your condition.   When traveling, keep your medical information, health care provider's names, and the medicines you take with you at all times.   If you develop a fever, do not take medicines to reduce the fever right away. This could cover up a problem that is developing. Notify your health care provider.  Keep all follow-up appointments with your health care provider. Sickle cell anemia requires regular medical care. SEEK MEDICAL CARE IF: You have a fever. SEEK IMMEDIATE MEDICAL CARE IF:   You feel dizzy or faint.   You have new abdominal pain, especially on the left side near the stomach area.   You develop a persistent, often uncomfortable and painful penile erection (priapism). If this is not treated immediately it   will lead to impotence.   You have numbness your arms or legs or you have a hard time moving them.   You have a hard time with speech.   You have a fever or persistent symptoms for more than 2-3 days.   You have a fever and your symptoms suddenly get worse.   You have signs or symptoms of infection.  These include:   Chills.   Abnormal tiredness (lethargy).   Irritability.   Poor eating.   Vomiting.   You develop pain that is not helped with medicine.   You develop shortness of breath.  You have pain in your chest.   You are coughing up pus-like or bloody sputum.   You develop a stiff neck.  Your feet or hands swell or have pain.  Your abdomen appears bloated.  You develop joint pain. MAKE SURE YOU:  Understand these instructions.   This information is not intended to replace advice given to you by your health care provider. Make sure you discuss any questions you have with your health care provider.   Document Released: 04/27/2005 Document Revised: 02/07/2014 Document Reviewed: 08/29/2012 Elsevier Interactive Patient Education 2016 [employer]  

## 2015-06-09 NOTE — Progress Notes (Signed)
Admitted to the [facility] for pain in back and bilateral legs 8/10 rating. Treated with IV Toradol, IV fluids and PCA Dilaudid. Pain level down to 4/10 at time of discharge. Went over discharge instructions with patient and copy given with questions answered. Encouraged patient to continue using Incentive spirometer after discharge, and he agreed.  He uses home oxygen at night.  Alert, oriented and ambulatory at time of discharge. Discharged to home.

## 2015-06-09 NOTE — Progress Notes (Signed)
Patient ID: [name], male   DOB: 08-18-1980, 35 y.o.   MRN: [id number]   [name], is a 35 y.o. male  [id number]  [id number]  DOB - 14-Oct-1980  Chief Complaint  Patient presents with  . Follow-up    SCD        Subjective:   [name] is a 35 y.o. male with history of sickle cell anemia, pulmonary hypertension, chronic respiratory failure on home oxygen, history of PE on chronic anticoagulation, and hypertension presented  to the clinic today for hospital follow-up. He is complaining of severe sickle cell pain, rated at 8 out of 10 with other symptoms of upper respiratory infection including cough and runny nose and would like to be admitted to the day hospital for observation and pain management. Patient was admitted to the hospital on 06/02/2015 and was discharged only yesterday for the same sickle cell anemia with crisis. During his hospitalization, patient was transfused with 1 unit of packed red blood cell for low hemoglobin. Patient follows up regularly with pulmonologist and cardiologist for his severe pulmonary hypertension. Today he has no fever, he denies any chest pain, he denies shortness of breath, no leg swelling. Patient has No headache, No chest pain, No abdominal pain - No Nausea, No new weakness tingling or numbness. He claims compliant with his medications, reports no side effect. He recently told [facility] case managers not to visit him no more for reasons best known to him.   Problem  Acute Upper Respiratory Infection    ALLERGIES: No Known Allergies  PAST MEDICAL HISTORY: Past Medical History  Diagnosis Date  . Sickle cell anemia (HCC)   . Blood transfusion   . Acute embolism and thrombosis of right internal jugular vein ([place])   . Hypokalemia   . Mood disorder ([place])   . History of pulmonary embolus (PE)   . Avascular necrosis (HCC)     Right Hip  . Leukocytosis     Chronic  . Thrombocytosis (HCC)     Chronic  . Hypertension   .  History of Clostridium difficile infection   . Uses marijuana   . Chronic anticoagulation   . Functional asplenia   . Former smoker   . Second hand tobacco smoke exposure   . Alcohol consumption of one to four drinks per day   . Noncompliance with medication regimen   . Sickle-cell crisis with associated acute chest syndrome ([place]) 05/13/2013  . Acute chest syndrome ([place]) 06/18/2013  . Demand ischemia ([place]) 01/02/2014  . Pulmonary hypertension ([place])   . Hb-SS disease with crisis ([place])   . Oxygen deficiency     MEDICATIONS AT HOME: Prior to Admission medications   Medication Sig Start Date End Date Taking? Authorizing Provider  aspirin 81 MG chewable tablet Chew 1 tablet (81 mg total) by mouth daily. 07/23/14   [name], MD  Cholecalciferol (VITAMIN D) 2000 units tablet Take 1 tablet (2,000 Units total) by mouth daily. 02/23/15   Dorena Dew, FNP  cyclobenzaprine (FLEXERIL) 5 MG tablet Take 1 tablet (5 mg total) by mouth 3 (three) times daily as needed for muscle spasms. 02/19/15   Alfonzo Beers, MD  enoxaparin (LOVENOX) 120 MG/0.8ML injection Inject 0.8 mLs (120 mg total) into the skin daily. 12/03/14   [name], MD  folic acid (FOLVITE) 1 MG tablet Take 1 tablet (1 mg total) by mouth every morning. 02/23/15   Dorena Dew, FNP  gabapentin (NEURONTIN) 300 MG capsule Take  1 capsule (300 mg total) by mouth 3 (three) times daily. 06/01/15   Tresa Garter, MD  HYDROmorphone (DILAUDID) 4 MG tablet Take 1 tablet (4 mg total) by mouth every 4 (four) hours as needed for severe pain. 06/01/15   Tresa Garter, MD  hydroxyurea (HYDREA) 500 MG capsule Take 2 capsules (1,000 mg total) by mouth daily. May take with food to minimize GI side effects. 06/01/15   Tresa Garter, MD  lisinopril (PRINIVIL,ZESTRIL) 10 MG tablet Take 1 tablet (10 mg total) by mouth daily. 04/13/15   Dorena Dew, FNP  metoprolol succinate (TOPROL-XL) 50 MG 24 hr tablet Take 1 tablet (50 mg  total) by mouth daily. 02/23/15   Dorena Dew, FNP  morphine (MS CONTIN) 30 MG 12 hr tablet Take 1 tablet (30 mg total) by mouth every 12 (twelve) hours. 06/01/15   Tresa Garter, MD  potassium chloride SA (K-DUR,KLOR-CON) 20 MEQ tablet Take 1 tablet (20 mEq total) by mouth every morning. Patient taking differently: Take 20 mEq by mouth daily.  04/13/15   Dorena Dew, FNP  zolpidem (AMBIEN) 10 MG tablet Take 1 tablet (10 mg total) by mouth at bedtime as needed for sleep. 06/01/15   Tresa Garter, MD     Objective:   Filed Vitals:   06/09/15 1059  BP: 113/81  Pulse: 83  Temp: 99.1 F (37.3 C)  TempSrc: Oral  Resp: 18  Height: 6' (1.829 m)  Weight: 171 lb (77.565 kg)  SpO2: 94%    Exam General appearance : Awake, alert, not in any distress. Speech Clear. Not toxic looking HEENT: Atraumatic and Normocephalic, pupils equally reactive to light and accomodation Neck: supple, no JVD. No cervical lymphadenopathy.  Chest:Good air entry bilaterally, no added sounds  CVS: S1 S2 irregular, loud P2, + murmurs.  Abdomen: Bowel sounds present, Non tender and not distended with no gaurding, rigidity or rebound. Extremities: B/L Lower Ext shows no edema, both legs are warm to touch Neurology: Awake alert, and oriented X 3, CN II-XII intact, Non focal  Data Review No results found for: HGBA1C   Assessment & Plan   1. Sickle-cell disease with pain ([place])  Admit to Day Hospital for Extended Pain Management and Observation  2. Acute upper respiratory infection: Most likely viral  Symptomatic treatment for now Return precautions emphasized  Patient have been counseled extensively about nutrition and exercise  Return in about 4 weeks (around 07/07/2015) for Sickle Cell Disease/Pain.  The patient was given clear instructions to go to ER or return to medical center if symptoms don't improve, worsen or new problems develop. The patient verbalized understanding. The patient was  told to call to get lab results if they haven't heard anything in the next week.   This note has been created with Surveyor, quantity. Any transcriptional errors are unintentional.    [name], MD, [place], Karilyn Cota, [place] and [facility] [place], [place]   06/09/2015, 11:17 AM

## 2015-06-12 ENCOUNTER — Telehealth (HOSPITAL_COMMUNITY): Payer: Self-pay | Admitting: *Deleted

## 2015-06-12 ENCOUNTER — Non-Acute Institutional Stay (HOSPITAL_COMMUNITY)
Admission: AD | Admit: 2015-06-12 | Discharge: 2015-06-12 | Disposition: A | Discharge status: Home / Self Care | Payer: Medicare Other | Source: Ambulatory Visit | Attending: Internal Medicine | Admitting: Internal Medicine

## 2015-06-12 ENCOUNTER — Encounter (HOSPITAL_COMMUNITY): Payer: Self-pay | Admitting: *Deleted

## 2015-06-12 ENCOUNTER — Emergency Department (HOSPITAL_COMMUNITY)
Admission: EM | Admit: 2015-06-12 | Discharge: 2015-06-12 | Disposition: A | Discharge status: Other Acute Inpatient Hospital | Payer: Medicare Other | Source: Home / Self Care | Attending: Emergency Medicine | Admitting: Emergency Medicine

## 2015-06-12 ENCOUNTER — Encounter (HOSPITAL_COMMUNITY): Payer: Self-pay | Admitting: Emergency Medicine

## 2015-06-12 DIAGNOSIS — Z86718 Personal history of other venous thrombosis and embolism: Secondary | ICD-10-CM | POA: Insufficient documentation

## 2015-06-12 DIAGNOSIS — Z79899 Other long term (current) drug therapy: Secondary | ICD-10-CM

## 2015-06-12 DIAGNOSIS — Z86711 Personal history of pulmonary embolism: Secondary | ICD-10-CM | POA: Insufficient documentation

## 2015-06-12 DIAGNOSIS — Z7901 Long term (current) use of anticoagulants: Secondary | ICD-10-CM | POA: Diagnosis not present

## 2015-06-12 DIAGNOSIS — Z96641 Presence of right artificial hip joint: Secondary | ICD-10-CM | POA: Insufficient documentation

## 2015-06-12 DIAGNOSIS — I10 Essential (primary) hypertension: Secondary | ICD-10-CM

## 2015-06-12 DIAGNOSIS — I272 Other secondary pulmonary hypertension: Secondary | ICD-10-CM | POA: Insufficient documentation

## 2015-06-12 DIAGNOSIS — Z79891 Long term (current) use of opiate analgesic: Secondary | ICD-10-CM | POA: Insufficient documentation

## 2015-06-12 DIAGNOSIS — Z9049 Acquired absence of other specified parts of digestive tract: Secondary | ICD-10-CM | POA: Insufficient documentation

## 2015-06-12 DIAGNOSIS — R079 Chest pain, unspecified: Secondary | ICD-10-CM | POA: Diagnosis not present

## 2015-06-12 DIAGNOSIS — Z7982 Long term (current) use of aspirin: Secondary | ICD-10-CM | POA: Insufficient documentation

## 2015-06-12 DIAGNOSIS — Z9981 Dependence on supplemental oxygen: Secondary | ICD-10-CM | POA: Insufficient documentation

## 2015-06-12 DIAGNOSIS — Z87891 Personal history of nicotine dependence: Secondary | ICD-10-CM | POA: Insufficient documentation

## 2015-06-12 DIAGNOSIS — D5700 Hb-SS disease with crisis, unspecified: Secondary | ICD-10-CM | POA: Diagnosis not present

## 2015-06-12 LAB — CBC WITH DIFFERENTIAL/PLATELET
Basophils Absolute: 0 10*3/uL (ref 0.0–0.1)
Basophils Relative: 0 %
EOS ABS: 0.5 10*3/uL (ref 0.0–0.7)
EOS PCT: 3 %
HCT: 22.1 % — ABNORMAL LOW (ref 39.0–52.0)
Hemoglobin: 7.6 g/dL — ABNORMAL LOW (ref 13.0–17.0)
LYMPHS ABS: 1.9 10*3/uL (ref 0.7–4.0)
Lymphocytes Relative: 11 %
MCH: 31.4 pg (ref 26.0–34.0)
MCHC: 34.4 g/dL (ref 30.0–36.0)
MCV: 91.3 fL (ref 78.0–100.0)
MONO ABS: 2.5 10*3/uL — AB (ref 0.1–1.0)
Monocytes Relative: 14 %
NEUTROS ABS: 12.6 10*3/uL — AB (ref 1.7–7.7)
NEUTROS PCT: 72 %
PLATELETS: 497 10*3/uL — AB (ref 150–400)
RBC: 2.42 MIL/uL — AB (ref 4.22–5.81)
RDW: 24.4 % — AB (ref 11.5–15.5)
WBC: 17.5 10*3/uL — AB (ref 4.0–10.5)

## 2015-06-12 LAB — COMPREHENSIVE METABOLIC PANEL
ALBUMIN: 4.5 g/dL (ref 3.5–5.0)
ALT: 20 U/L (ref 17–63)
ANION GAP: 9 (ref 5–15)
AST: 39 U/L (ref 15–41)
Alkaline Phosphatase: 63 U/L (ref 38–126)
BUN: 11 mg/dL (ref 6–20)
CHLORIDE: 109 mmol/L (ref 101–111)
CO2: 21 mmol/L — ABNORMAL LOW (ref 22–32)
Calcium: 9.3 mg/dL (ref 8.9–10.3)
Creatinine, Ser: 0.54 mg/dL — ABNORMAL LOW (ref 0.61–1.24)
GFR calc Af Amer: >60 mL/min (ref >60)
GFR calc non Af Amer: >60 mL/min (ref >60)
GLUCOSE: 118 mg/dL — AB (ref 65–99)
POTASSIUM: 3.9 mmol/L (ref 3.5–5.1)
Sodium: 139 mmol/L (ref 135–145)
Total Bilirubin: 5.3 mg/dL — ABNORMAL HIGH (ref 0.3–1.2)
Total Protein: 7.7 g/dL (ref 6.5–8.1)

## 2015-06-12 LAB — RETICULOCYTES
RBC.: 2.42 MIL/uL — ABNORMAL LOW (ref 4.22–5.81)
RETIC COUNT ABSOLUTE: 404.1 10*3/uL — AB (ref 19.0–186.0)
RETIC CT PCT: 16.7 % — AB (ref 0.4–3.1)

## 2015-06-12 MED ORDER — DIPHENHYDRAMINE HCL 12.5 MG/5ML PO ELIX: 12.5000 mg | ORAL_SOLUTION | Freq: Four times a day (QID) | ORAL | Status: DC | PRN

## 2015-06-12 MED ORDER — NALOXONE HCL 0.4 MG/ML IJ SOLN: 0.4000 mg | INTRAMUSCULAR | Status: DC | PRN

## 2015-06-12 MED ORDER — HYDROMORPHONE 1 MG/ML IV SOLN
INTRAVENOUS | Status: DC
  Administered 2015-06-12: 17.5 mg via INTRAVENOUS
  Administered 2015-06-12: 11:00:00 via INTRAVENOUS
  Filled 2015-06-12: qty 25

## 2015-06-12 MED ORDER — ONDANSETRON HCL 4 MG/2ML IJ SOLN: 4.0000 mg | Freq: Four times a day (QID) | INTRAMUSCULAR | Status: DC | PRN

## 2015-06-12 MED ORDER — KETOROLAC TROMETHAMINE 30 MG/ML IJ SOLN
30.0000 mg | Freq: Once | INTRAMUSCULAR | Status: AC
  Administered 2015-06-12: 30 mg via INTRAVENOUS
  Filled 2015-06-12: qty 1

## 2015-06-12 MED ORDER — SODIUM CHLORIDE 0.9 % IV SOLN
12.5000 mg | Freq: Four times a day (QID) | INTRAVENOUS | Status: DC | PRN
  Filled 2015-06-12: qty 0.25

## 2015-06-12 MED ORDER — HEPARIN SOD (PORK) LOCK FLUSH 100 UNIT/ML IV SOLN
500.0000 [IU] | INTRAVENOUS | Status: AC | PRN
  Administered 2015-06-12: 500 [IU]
  Filled 2015-06-12: qty 5

## 2015-06-12 MED ORDER — SODIUM CHLORIDE 0.9% FLUSH
10.0000 mL | INTRAVENOUS | Status: AC | PRN
  Administered 2015-06-12: 10 mL

## 2015-06-12 MED ORDER — SODIUM CHLORIDE 0.9% FLUSH: 9.0000 mL | INTRAVENOUS | Status: DC | PRN

## 2015-06-12 MED ORDER — DEXTROSE-NACL 5-0.45 % IV SOLN
INTRAVENOUS | Status: DC
  Administered 2015-06-12: 11:00:00 via INTRAVENOUS

## 2015-06-12 NOTE — Progress Notes (Signed)
Admitted to [facility] for treatment of 9/10 pain in left rib cage and lower back. Patient came to [facility] from ED. No treatment given in ED. Treated in [facility] with IV Toradol, IV fluids and PCA Dilaudid. Pain level down to 5 at time of discharge. Alert, oriented and ambulatory at time of discharge. Didn't bring his home oxygen to wear home. Room air oximeter at time of discharge is 97%. Discharge instructions given to patient and patient voiced understanding. Discharged to home.

## 2015-06-12 NOTE — Telephone Encounter (Signed)
After speaking with the provider C. Smith Robert, NP, patient was advised to go to the emergency department. Pt voiced understanding.

## 2015-06-12 NOTE — Discharge Summary (Signed)
[facility] Discharge Summary   Patient ID: [name] MRN: [id number] DOB/AGE: August 01, 1980 35 y.o.  Admit date: 06/12/2015 Discharge date: 06/12/2015  Primary Care Physician:  [name], MD  Admission Diagnoses:  Active Problems:   Sickle cell pain crisis [facility])  Discharge Medications:    Medication List    ASK your doctor about these medications        aspirin 81 MG chewable tablet  Chew 1 tablet (81 mg total) by mouth daily.     cyclobenzaprine 5 MG tablet  Commonly known as:  FLEXERIL  Take 1 tablet (5 mg total) by mouth 3 (three) times daily as needed for muscle spasms.     enoxaparin 120 MG/0.8ML injection  Commonly known as:  LOVENOX  Inject 0.8 mLs (120 mg total) into the skin daily.     folic acid 1 MG tablet  Commonly known as:  FOLVITE  Take 1 tablet (1 mg total) by mouth every morning.     gabapentin 300 MG capsule  Commonly known as:  NEURONTIN  Take 1 capsule (300 mg total) by mouth 3 (three) times daily.     HYDROmorphone 4 MG tablet  Commonly known as:  DILAUDID  Take 1 tablet (4 mg total) by mouth every 4 (four) hours as needed for severe pain.     hydroxyurea 500 MG capsule  Commonly known as:  HYDREA  Take 2 capsules (1,000 mg total) by mouth daily. May take with food to minimize GI side effects.     lisinopril 10 MG tablet  Commonly known as:  PRINIVIL,ZESTRIL  Take 1 tablet (10 mg total) by mouth daily.     metoprolol succinate 50 MG 24 hr tablet  Commonly known as:  TOPROL-XL  Take 1 tablet (50 mg total) by mouth daily.     morphine 30 MG 12 hr tablet  Commonly known as:  MS CONTIN  Take 1 tablet (30 mg total) by mouth every 12 (twelve) hours.     potassium chloride SA 20 MEQ tablet  Commonly known as:  K-DUR,KLOR-CON  Take 1 tablet (20 mEq total) by mouth every morning.     Vitamin D 2000 units tablet  Take 1 tablet (2,000 Units total) by mouth daily.     zolpidem 10 MG tablet  Commonly known  as:  AMBIEN  Take 1 tablet (10 mg total) by mouth at bedtime as needed for sleep.         Consults:  None  Significant Diagnostic Studies:  Dg Chest 2 View  06/09/2015  CLINICAL DATA:  Cough and body aches for the past 2 days, no shortness of breath, history of pulmonary embolism, sickle cell anemia EXAM: CHEST  2 VIEW COMPARISON:  PA and lateral chest x-ray of Jun 02, 2015 FINDINGS: The lungs are adequately inflated. The cardiopericardial silhouette remains enlarged. The pulmonary vascularity remains engorged but is slightly less the bony thorax exhibits no acute abnormality. There IMPRESSION: COPD, low-grade CHF. No alveolar pneumonia. If the patient has elevated white blood cell count or fever, follow-up PA and lateral chest X-ray is recommended in 3-4 weeks following trial of antibiotic therapy. Electronically Signed   By: David  Martinique [name].D.   On: 06/09/2015 12:55   Dg Chest 2 View  06/02/2015  CLINICAL DATA:  Sickle cell anemia. Left-sided chest pain for 1 day. EXAM: CHEST  2 VIEW COMPARISON:  06/01/2015 FINDINGS: There is an enlarged cardiac silhouette, stable from 06/01/2015. No pleural effusions.  No airspace consolidation. Mild vascular prominence, unchanged. Airway is unremarkable. There is a left subclavian Port-A-Cath with tip at the cavoatrial junction. IMPRESSION: Enlarged cardiac silhouette, stable. No interval change from 06/01/2015. Electronically Signed   By: Andreas Newport [name].D.   On: 06/02/2015 18:47   Dg Chest 2 View  06/01/2015  CLINICAL DATA:  Rib cage pain and mid back pain. Sickle cell crisis. EXAM: CHEST  2 VIEW COMPARISON:  05/07/2015 FINDINGS: Chronic cardiomegaly. Pulmonary vascularity is normal. No infiltrates or effusions. Power port in place. No bone abnormality. IMPRESSION: No acute abnormality.  Chronic cardiomegaly. Electronically Signed   By: Lorriane Shire [name].D.   On: 06/01/2015 10:08   Ct Abdomen Pelvis W Contrast  06/03/2015  CLINICAL DATA:  Fever flank pain and  leukocytosis. EXAM: CT ABDOMEN AND PELVIS WITH CONTRAST TECHNIQUE: Multidetector CT imaging of the abdomen and pelvis was performed using the standard protocol following bolus administration of intravenous contrast. CONTRAST:  173mL ISOVUE-300 IOPAMIDOL (ISOVUE-300) INJECTION 61% COMPARISON:  06/01/2009 FINDINGS: Lower chest: Ventriculomegaly, unchanged. Chronic scarring in both lungs. Hepatobiliary: Cholecystectomy. No focal liver lesions. No bile duct dilatation. Pancreas: Normal Spleen: Small and calcified, unchanged. Adrenals/Urinary Tract: The adrenals and kidneys are normal in appearance. There is no urinary calculus evident. There is no hydronephrosis or ureteral dilatation. Collecting systems and ureters appear unremarkable. Stomach/Bowel: There are normal appearances of the stomach, small bowel and colon. The appendix is normal. Vascular/Lymphatic: The abdominal aorta is normal in caliber. There is mild atherosclerotic calcification. There is no pathologically enlarged adenopathy in the abdomen or pelvis. There is a prominent number of small periaortic nodes, unchanged. Reproductive: Unremarkable Other: No acute inflammatory changes are evident in the abdomen or pelvis. There is no ascites. Musculoskeletal: Multiple bone infarcts. 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 Incidental umbilical hernia, nonobstructive. IMPRESSION: No acute findings are evident in the abdomen or pelvis. 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 Cardiomegaly. The findings are all typical of sickle cell disease. Electronically Signed   By: Andreas Newport [name].D.   On: 06/03/2015 00:47     [facility] Course: [name], was admitted to the day infusion center for extended observation.  Continued oxygen at 2 liters.  Reviewed labs, consistent with baseline.  He was started on high concentration PCA dilaudid. He used a total of 17.5 mg with 29 demands and 25 deliveries. Pain  intensity decreased to 5/10.  [name] says that he can manage at home on current medication regimen. Reminded to continue to hydrate consistently. Also, continue home oxygen.  Patient was reminded to follow up in office as scheduled.  The patient was given clear instructions to go to ER or return to medical center if symptoms do not improve, worsen or new problems develop. The patient verbalized understanding.   Physical Exam at Discharge:  BP 109/69 mmHg  Pulse 77  Temp(Src) 98.7 F (37.1 C) (Oral)  Resp 22  Ht 6' (1.829 [name])  Wt 169 lb (76.658 kg)  BMI 22.92 kg/m2  SpO2 94%  General Appearance:    Alert, cooperative, no distress, appears stated age  Head:    Normocephalic, without obvious abnormality, atraumatic  Eyes:    PERRL, conjunctiva/corneas clear, EOM's intact, fundi    benign, both eyes       Lungs:     Clear to auscultation bilaterally, respirations unlabored  Chest wall:    No tenderness or deformity  Heart:    Regular rate and rhythm, S1  and S2 normal, no murmur, rub   or gallop  Abdomen:     Soft, non-tender, bowel sounds active all four quadrants,    no masses, no organomegaly  Pulses:   2+ and symmetric all extremities  Skin:   Skin color, texture, turgor normal, no rashes or lesions  Lymph nodes:   Cervical, supraclavicular, and axillary nodes normal  Neurologic:   CNII-XII intact. Normal strength, sensation and reflexes      throughout   Disposition at Discharge: 01-Home or Self Care  Discharge Orders:   Condition at Discharge:   Stable  Time spent on Discharge:  15 minutes  Signed: Damonte Frieson [name] 06/12/2015, 4:13 PM

## 2015-06-12 NOTE — H&P (Signed)
[facility] History and Physical   Date: 06/12/2015  Patient name: [name] Medical record number: [id number] Date of birth: 06/05/1980 Age: 35 y.o. Gender: male PCP: [name], MD  Attending physician: Tresa Garter, MD  Chief Complaint: Left flank pain  History of Present Illness: [name], a 35 year old male with a history of sickle cell anemia, HbSS presents complaining of left flank pain, which is consistent with his usual sickle cell pain.  He maintains that pain started several days ago and has not been controlled on home medication regimen. He has not identified any palliative or provocative factors attributing to crisis. Patient was evaluated in the day infusion center on 06/09/2015 for a sickle cell pain crisis.   Current pain intensity is 9/10 described as constant and throbbing. He last had Dilaudid 4 mg around 6 am with minimal relief. He is taking all other medications consistently. He denies headache, chest pains, fatigue, dysuria, nausea, vomiting, or diarrhea.   Meds: Prescriptions prior to admission  Medication Sig Dispense Refill Last Dose  . aspirin 81 MG chewable tablet Chew 1 tablet (81 mg total) by mouth daily. 30 tablet 11 06/11/2015 at Unknown time  . Cholecalciferol (VITAMIN D) 2000 units tablet Take 1 tablet (2,000 Units total) by mouth daily. 90 tablet 3 06/11/2015 at Unknown time  . cyclobenzaprine (FLEXERIL) 5 MG tablet Take 1 tablet (5 mg total) by mouth 3 (three) times daily as needed for muscle spasms. (Patient not taking: Reported on 06/12/2015) 20 tablet 0 Completed Course at Unknown time  . enoxaparin (LOVENOX) 120 MG/0.8ML injection Inject 0.8 mLs (120 mg total) into the skin daily. 30 Syringe 2 06/11/2015 at 2000  . folic acid (FOLVITE) 1 MG tablet Take 1 tablet (1 mg total) by mouth every morning. 30 tablet 11 06/11/2015 at Unknown time  . gabapentin (NEURONTIN) 300 MG capsule Take 1 capsule (300 mg total) by  mouth 3 (three) times daily. 270 capsule 3 06/11/2015 at Unknown time  . HYDROmorphone (DILAUDID) 4 MG tablet Take 1 tablet (4 mg total) by mouth every 4 (four) hours as needed for severe pain. 90 tablet 0 06/12/2015 at Unknown time  . hydroxyurea (HYDREA) 500 MG capsule Take 2 capsules (1,000 mg total) by mouth daily. May take with food to minimize GI side effects. 60 capsule 5 06/11/2015 at Unknown time  . lisinopril (PRINIVIL,ZESTRIL) 10 MG tablet Take 1 tablet (10 mg total) by mouth daily. 30 tablet 5 06/11/2015 at Unknown time  . metoprolol succinate (TOPROL-XL) 50 MG 24 hr tablet Take 1 tablet (50 mg total) by mouth daily. 30 tablet 3 06/11/2015 at 2000  . morphine (MS CONTIN) 30 MG 12 hr tablet Take 1 tablet (30 mg total) by mouth every 12 (twelve) hours. 60 tablet 0 06/12/2015 at 0600  . potassium chloride SA (K-DUR,KLOR-CON) 20 MEQ tablet Take 1 tablet (20 mEq total) by mouth every morning. (Patient taking differently: Take 20 mEq by mouth daily. ) 30 tablet 3 06/11/2015 at Unknown time  . zolpidem (AMBIEN) 10 MG tablet Take 1 tablet (10 mg total) by mouth at bedtime as needed for sleep. 30 tablet 0 Past Week at Unknown time    Allergies: Review of patient's allergies indicates no known allergies. Past Medical History  Diagnosis Date  . Sickle cell anemia (HCC)   . Blood transfusion   . Acute embolism and thrombosis of right internal jugular vein ([place])   . Hypokalemia   .  Mood disorder ([place])   . History of pulmonary embolus (PE)   . Avascular necrosis (HCC)     Right Hip  . Leukocytosis     Chronic  . Thrombocytosis (HCC)     Chronic  . Hypertension   . History of Clostridium difficile infection   . Uses marijuana   . Chronic anticoagulation   . Functional asplenia   . Former smoker   . Second hand tobacco smoke exposure   . Alcohol consumption of one to four drinks per day   . Noncompliance with medication regimen   . Sickle-cell crisis with associated acute chest syndrome ([place])  05/13/2013  . Acute chest syndrome ([place]) 06/18/2013  . Demand ischemia ([place]) 01/02/2014  . Pulmonary hypertension ([place])   . Hb-SS disease with crisis ([place])   . Oxygen deficiency    Past Surgical History  Procedure Laterality Date  . Right hip replacement      08/2006  . Cholecystectomy      01/2008  . Porta cath placement    . Porta cath removal    . Umbilical hernia repair      01/2008  . Excision of left periauricular cyst      10/2009  . Excision of right ear lobe cyst with primary closur      11/2007  . Portacath placement  01/05/2012    Procedure: INSERTION PORT-A-CATH;  Surgeon: Odis Hollingshead, MD;  Location: [place];  Service: General;  Laterality: N/A;  ultrasound guiced port a cath insertion with fluoroscopy   Family History  Problem Relation Age of Onset  . Sickle cell trait Mother   . Depression Mother   . Diabetes Mother   . Sickle cell trait Father   . Sickle cell trait Brother    Social History   Social History  . Marital Status: Single    Spouse Name: N/A  . Number of Children: 0  . Years of Education: 13   Occupational History  . 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 Smoking status: Former Smoker -- 0.50 packs/day for 10 years    Types: Cigarettes    Quit date: 05/29/2011  . Smokeless tobacco: Never Used  . Alcohol Use: No  . Drug Use: No     Comment: Marijuana weekly  . Sexual Activity:    Partners: Female     Comment: month ago   Other Topics Concern  . Not on file   Social History Narrative   Lives in an apartment.  Single.  Lives alone but has a girlfriend that helps care for him.  Does not use any assist devices.        Gerald [id number]  [phone] Mom, emergency contact      [place] [facility]:   Patient continuing to live in her apartment in town alone. 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    Review of Systems: Eyes: positive for icterus Ears, nose, mouth, throat, and face:  negative Respiratory: positive for dyspnea on exertion, negative for emphysema, sputum and wheezing Cardiovascular: negative Gastrointestinal: negative for abdominal pain, constipation and dysphagia Genitourinary:positive for dysuria and frequency Integument/breast: negative Hematologic/lymphatic: negative Musculoskeletal:positive for bone pain and myalgias Neurological: negative Behavioral/Psych: negative Endocrine: negative  Physical Exam: There were no vitals taken for this visit.  General Appearance:    Alert, cooperative, mild distress, appears stated age  Head:    Normocephalic, without obvious  abnormality, atraumatic  Eyes:    PERRL, conjunctiva/corneas clear, EOM's intact, fundi    benign, scleral icterus      Ears:    Normal TM's and external ear canals, both ears  Nose:   Nares normal, septum midline, mucosa normal, no drainage    or sinus tenderness  Throat:   Lips, mucosa, and tongue normal; teeth and gums normal  Neck:   Supple, symmetrical, trachea midline, no adenopathy;       thyroid:  No enlargement/tenderness/nodules; no carotid   bruit or JVD  Back:     Symmetric, no curvature, ROM normal, no CVA tenderness  Lungs:     Clear to auscultation bilaterally, respirations unlabored  Chest wall:    No tenderness to palpation or deformity  Heart:    Regular rate and rhythm, S1 and S2 normal, + Murmur, No rub or gallop  Extremities:   Extremities normal, atraumatic, no cyanosis or edema  Pulses:   2+ and symmetric all extremities  Skin:   Skin color, texture, turgor normal, no rashes or lesions  Lymph nodes:   Cervical, supraclavicular, and axillary nodes normal  Neurologic:   CNII-XII intact. Normal strength, sensation and reflexes      throughout    Lab results: No results found. However, due to the size of the patient record, not all encounters were searched. Please check Results Review for a complete set of results.  Imaging results:  No results  found. Assessment & Plan:  Patient will be admitted to the day infusion center for extended observation  Start hypotonic  IV fluid  for cellular rehydration at 150/hr  Start Toradol 30 mg IV times one for inflammation.  Start high concentration dilaudid PCA  Patient will be re-evaluated for pain intensity in the context of function and relationship to baseline as care progresses.  If no significant pain relief, will transfer patient to inpatient services for a higher level of care.   Start oxygen via n/c at 2 liters per home dose  CBC w/differential and CMP. Previous hemoglobin was 6.7 and platelet count mildly elevated at 467 on 06/04/2015.   Hollis,Lachina M 06/12/2015, 10:11 AM

## 2015-06-12 NOTE — Telephone Encounter (Signed)
Patient called requesting to come to the [facility] for treatment. Stated his pain was in his sides and that his pain # was 9/10. Pt denies sob, chest pain, abd pain, nausea, vomiting, priapism or fever. Will check with the medical provider and give him a call back. Pt voiced understanding.

## 2015-06-12 NOTE — Discharge Instructions (Signed)
Sickle Cell Anemia, Adult Sickle cell anemia is a condition in which red blood cells have an abnormal "sickle" shape. This abnormal shape shortens the cells' life span, which results in a lower than normal concentration of red blood cells in the blood. The sickle shape also causes the cells to clump together and block free blood flow through the blood vessels. As a result, the tissues and organs of the body do not receive enough oxygen. Sickle cell anemia causes organ damage and pain and increases the risk of infection. CAUSES  Sickle cell anemia is a genetic disorder. Those who receive two copies of the gene have the condition, and those who receive one copy have the trait. RISK FACTORS The sickle cell gene is most common in people whose families originated in [place]. Other areas of the globe where sickle cell trait occurs include the [place], [place] and [place], the [place], and the [place].  SIGNS AND SYMPTOMS  Pain, especially in the extremities, back, chest, or abdomen (common). The pain may start suddenly or may develop following an illness, especially if there is dehydration. Pain can also occur due to overexertion or exposure to extreme temperature changes.  Frequent severe bacterial infections, especially certain types of pneumonia and meningitis.  Pain and swelling in the hands and feet.  Decreased activity.   Loss of appetite.   Change in behavior.  Headaches.  Seizures.  Shortness of breath or difficulty breathing.  Vision changes.  Skin ulcers. Those with the trait may not have symptoms or they may have mild symptoms.  DIAGNOSIS  Sickle cell anemia is diagnosed with blood tests that demonstrate the genetic trait. It is often diagnosed during the newborn period, due to mandatory testing nationwide. A variety of blood tests, X-rays, CT scans, MRI scans, ultrasounds, and lung function tests may also be done to monitor the condition. TREATMENT  Sickle  cell anemia may be treated with:  Medicines. You may be given pain medicines, antibiotic medicines (to treat and prevent infections) or medicines to increase the production of certain types of hemoglobin.  Fluids.  Oxygen.  Blood transfusions. HOME CARE INSTRUCTIONS   Drink enough fluid to keep your urine clear or pale yellow. Increase your fluid intake in hot weather and during exercise.  Do not smoke. Smoking lowers oxygen levels in the blood.   Only take over-the-counter or prescription medicines for pain, fever, or discomfort as directed by your health care provider.  Take antibiotics as directed by your health care provider. Make sure you finish them it even if you start to feel better.   Take supplements as directed by your health care provider.   Consider wearing a medical alert bracelet. This tells anyone caring for you in an emergency of your condition.   When traveling, keep your medical information, health care provider's names, and the medicines you take with you at all times.   If you develop a fever, do not take medicines to reduce the fever right away. This could cover up a problem that is developing. Notify your health care provider.  Keep all follow-up appointments with your health care provider. Sickle cell anemia requires regular medical care. SEEK MEDICAL CARE IF: You have a fever. SEEK IMMEDIATE MEDICAL CARE IF:   You feel dizzy or faint.   You have new abdominal pain, especially on the left side near the stomach area.   You develop a persistent, often uncomfortable and painful penile erection (priapism). If this is not treated immediately it   will lead to impotence.   You have numbness your arms or legs or you have a hard time moving them.   You have a hard time with speech.   You have a fever or persistent symptoms for more than 2-3 days.   You have a fever and your symptoms suddenly get worse.   You have signs or symptoms of infection.  These include:   Chills.   Abnormal tiredness (lethargy).   Irritability.   Poor eating.   Vomiting.   You develop pain that is not helped with medicine.   You develop shortness of breath.  You have pain in your chest.   You are coughing up pus-like or bloody sputum.   You develop a stiff neck.  Your feet or hands swell or have pain.  Your abdomen appears bloated.  You develop joint pain. MAKE SURE YOU:  Understand these instructions.   This information is not intended to replace advice given to you by your health care provider. Make sure you discuss any questions you have with your health care provider.   Document Released: 04/27/2005 Document Revised: 02/07/2014 Document Reviewed: 08/29/2012 Elsevier Interactive Patient Education 2016 [employer]  

## 2015-06-12 NOTE — ED Notes (Signed)
Patient here with complaints of sickle cell pain. Pain bilateral ribs. AAOx4, normally wears oxygen at night.

## 2015-06-12 NOTE — ED Provider Notes (Signed)
CSN: [id number]     Arrival date & time 06/12/15  0854 History   First MD Initiated Contact with Patient 06/12/15 0920     Chief Complaint  Patient presents with  . Sickle Cell Pain Crisis     (Consider location/radiation/quality/duration/timing/severity/associated sxs/prior Treatment) Patient is a 35 y.o. male presenting with sickle cell pain. The history is provided by the patient (The patient complains of another sickle cell crisis. Patient states he has pain in the left side of his lateral chest this is typical for him).  Sickle Cell Pain Crisis Location:  Chest Severity:  Moderate Similar to previous crisis episodes: yes   Timing:  Constant Progression:  Waxing and waning Chronicity:  Recurrent Associated symptoms: chest pain   Associated symptoms: no congestion, no cough, no fatigue and no headaches     Past Medical History  Diagnosis Date  . Sickle cell anemia (HCC)   . Blood transfusion   . Acute embolism and thrombosis of right internal jugular vein ([place])   . Hypokalemia   . Mood disorder ([place])   . History of pulmonary embolus (PE)   . Avascular necrosis (HCC)     Right Hip  . Leukocytosis     Chronic  . Thrombocytosis (HCC)     Chronic  . Hypertension   . History of Clostridium difficile infection   . Uses marijuana   . Chronic anticoagulation   . Functional asplenia   . Former smoker   . Second hand tobacco smoke exposure   . Alcohol consumption of one to four drinks per day   . Noncompliance with medication regimen   . Sickle-cell crisis with associated acute chest syndrome ([place]) 05/13/2013  . Acute chest syndrome ([place]) 06/18/2013  . Demand ischemia ([place]) 01/02/2014  . Pulmonary hypertension ([place])   . Hb-SS disease with crisis ([place])   . Oxygen deficiency    Past Surgical History  Procedure Laterality Date  . Right hip replacement      08/2006  . Cholecystectomy      01/2008  . Porta cath placement    . Porta cath removal    . Umbilical hernia repair       01/2008  . Excision of left periauricular cyst      10/2009  . Excision of right ear lobe cyst with primary closur      11/2007  . Portacath placement  01/05/2012    Procedure: INSERTION PORT-A-CATH;  Surgeon: Odis Hollingshead, MD;  Location: [place];  Service: General;  Laterality: N/A;  ultrasound guiced port a cath insertion with fluoroscopy   Family History  Problem Relation Age of Onset  . Sickle cell trait Mother   . Depression Mother   . Diabetes Mother   . Sickle cell trait Father   . Sickle cell trait Brother    Social History  Substance Use Topics  . Smoking status: Former Smoker -- 0.50 packs/day for 10 years    Types: Cigarettes    Quit date: 05/29/2011  . Smokeless tobacco: Never Used  . Alcohol Use: No    Review of Systems  Constitutional: Negative for appetite change and fatigue.  HENT: Negative for congestion, ear discharge and sinus pressure.   Eyes: Negative for discharge.  Respiratory: Negative for cough.   Cardiovascular: Positive for chest pain.  Gastrointestinal: Negative for abdominal pain and diarrhea.  Genitourinary: Negative for frequency and hematuria.  Musculoskeletal: Negative for back pain.  Skin: Negative for rash.  Neurological: Negative for seizures  and headaches.  Psychiatric/Behavioral: Negative for hallucinations.      Allergies  Review of patient's allergies indicates no known allergies.  Home Medications   Prior to Admission medications   Medication Sig Start Date End Date Taking? Authorizing Provider  aspirin 81 MG chewable tablet Chew 1 tablet (81 mg total) by mouth daily. 07/23/14  Yes [name], MD  Cholecalciferol (VITAMIN D) 2000 units tablet Take 1 tablet (2,000 Units total) by mouth daily. 02/23/15  Yes Dorena Dew, FNP  enoxaparin (LOVENOX) 120 MG/0.8ML injection Inject 0.8 mLs (120 mg total) into the skin daily. 12/03/14  Yes [name], MD  folic acid (FOLVITE) 1 MG tablet Take 1 tablet (1 mg  total) by mouth every morning. 02/23/15  Yes Dorena Dew, FNP  gabapentin (NEURONTIN) 300 MG capsule Take 1 capsule (300 mg total) by mouth 3 (three) times daily. 06/01/15  Yes Tresa Garter, MD  HYDROmorphone (DILAUDID) 4 MG tablet Take 1 tablet (4 mg total) by mouth every 4 (four) hours as needed for severe pain. 06/01/15  Yes Tresa Garter, MD  hydroxyurea (HYDREA) 500 MG capsule Take 2 capsules (1,000 mg total) by mouth daily. May take with food to minimize GI side effects. 06/01/15  Yes Tresa Garter, MD  lisinopril (PRINIVIL,ZESTRIL) 10 MG tablet Take 1 tablet (10 mg total) by mouth daily. 04/13/15  Yes Dorena Dew, FNP  metoprolol succinate (TOPROL-XL) 50 MG 24 hr tablet Take 1 tablet (50 mg total) by mouth daily. 02/23/15  Yes Dorena Dew, FNP  morphine (MS CONTIN) 30 MG 12 hr tablet Take 1 tablet (30 mg total) by mouth every 12 (twelve) hours. 06/01/15  Yes Tresa Garter, MD  potassium chloride SA (K-DUR,KLOR-CON) 20 MEQ tablet Take 1 tablet (20 mEq total) by mouth every morning. Patient taking differently: Take 20 mEq by mouth daily.  04/13/15  Yes Dorena Dew, FNP  zolpidem (AMBIEN) 10 MG tablet Take 1 tablet (10 mg total) by mouth at bedtime as needed for sleep. 06/01/15  Yes Tresa Garter, MD  cyclobenzaprine (FLEXERIL) 5 MG tablet Take 1 tablet (5 mg total) by mouth 3 (three) times daily as needed for muscle spasms. Patient not taking: Reported on 06/12/2015 02/19/15   Alfonzo Beers, MD   BP 105/66 mmHg  Pulse 84  Temp(Src) 98.8 F (37.1 C) (Oral)  Resp 16  SpO2 92% Physical Exam  Constitutional: He is oriented to person, place, and time. He appears well-developed.  HENT:  Head: Normocephalic.  Eyes: Conjunctivae and EOM are normal. No scleral icterus.  Neck: Neck supple. No thyromegaly present.  Cardiovascular: Normal rate and regular rhythm.  Exam reveals no gallop and no friction rub.   No murmur heard. Pulmonary/Chest: No stridor. He has  no wheezes. He has no rales. He exhibits no tenderness.  Abdominal: He exhibits no distension. There is no tenderness. There is no rebound.  Musculoskeletal: Normal range of motion. He exhibits no edema.  Lymphadenopathy:    He has no cervical adenopathy.  Neurological: He is oriented to person, place, and time. He exhibits normal muscle tone. Coordination normal.  Skin: No rash noted. No erythema.  Psychiatric: He has a normal mood and affect. His behavior is normal.    ED Course  Procedures (including critical care time) Labs Review Labs Reviewed - No data to display  Imaging Review No results found. I have personally reviewed and evaluated these images and lab results as part of my  medical decision-making.   EKG Interpretation None      MDM   Final diagnoses:  Sickle cell crisis [facility])    Patient with sickle cell crisis. I spoke with the sickle clinic and we will take him over to the clinic now   Milton Ferguson, MD 06/12/15 [phone]

## 2015-06-19 ENCOUNTER — Encounter (HOSPITAL_COMMUNITY)
Admission: RE | Admit: 2015-06-19 | Discharge: 2015-06-19 | Disposition: A | Discharge status: Home / Self Care | Payer: Medicare Other | Source: Ambulatory Visit | Attending: Pulmonary Disease | Admitting: Pulmonary Disease

## 2015-06-19 ENCOUNTER — Encounter (HOSPITAL_COMMUNITY): Payer: Self-pay | Admitting: *Deleted

## 2015-06-19 ENCOUNTER — Telehealth (HOSPITAL_COMMUNITY): Payer: Self-pay | Admitting: *Deleted

## 2015-06-19 ENCOUNTER — Non-Acute Institutional Stay (HOSPITAL_COMMUNITY)
Admission: AD | Admit: 2015-06-19 | Discharge: 2015-06-19 | Disposition: A | Discharge status: Home / Self Care | Payer: Medicare Other | Source: Ambulatory Visit | Attending: Internal Medicine | Admitting: Internal Medicine

## 2015-06-19 ENCOUNTER — Telehealth (HOSPITAL_COMMUNITY): Payer: Self-pay | Admitting: Internal Medicine

## 2015-06-19 DIAGNOSIS — Z87891 Personal history of nicotine dependence: Secondary | ICD-10-CM | POA: Insufficient documentation

## 2015-06-19 DIAGNOSIS — I10 Essential (primary) hypertension: Secondary | ICD-10-CM | POA: Diagnosis not present

## 2015-06-19 DIAGNOSIS — D57819 Other sickle-cell disorders with crisis, unspecified: Secondary | ICD-10-CM | POA: Diagnosis not present

## 2015-06-19 DIAGNOSIS — Z79899 Other long term (current) drug therapy: Secondary | ICD-10-CM | POA: Insufficient documentation

## 2015-06-19 DIAGNOSIS — R0600 Dyspnea, unspecified: Secondary | ICD-10-CM

## 2015-06-19 DIAGNOSIS — R109 Unspecified abdominal pain: Secondary | ICD-10-CM | POA: Insufficient documentation

## 2015-06-19 DIAGNOSIS — I272 Pulmonary hypertension, unspecified: Secondary | ICD-10-CM

## 2015-06-19 DIAGNOSIS — I2721 Secondary pulmonary arterial hypertension: Secondary | ICD-10-CM

## 2015-06-19 DIAGNOSIS — D571 Sickle-cell disease without crisis: Secondary | ICD-10-CM | POA: Insufficient documentation

## 2015-06-19 DIAGNOSIS — Z96641 Presence of right artificial hip joint: Secondary | ICD-10-CM | POA: Insufficient documentation

## 2015-06-19 DIAGNOSIS — Z86718 Personal history of other venous thrombosis and embolism: Secondary | ICD-10-CM | POA: Insufficient documentation

## 2015-06-19 DIAGNOSIS — R0602 Shortness of breath: Secondary | ICD-10-CM | POA: Diagnosis not present

## 2015-06-19 DIAGNOSIS — Z7901 Long term (current) use of anticoagulants: Secondary | ICD-10-CM | POA: Diagnosis not present

## 2015-06-19 DIAGNOSIS — Z7982 Long term (current) use of aspirin: Secondary | ICD-10-CM | POA: Diagnosis not present

## 2015-06-19 DIAGNOSIS — Z79891 Long term (current) use of opiate analgesic: Secondary | ICD-10-CM | POA: Insufficient documentation

## 2015-06-19 DIAGNOSIS — Z86711 Personal history of pulmonary embolism: Secondary | ICD-10-CM | POA: Insufficient documentation

## 2015-06-19 DIAGNOSIS — D5700 Hb-SS disease with crisis, unspecified: Secondary | ICD-10-CM

## 2015-06-19 DIAGNOSIS — R05 Cough: Secondary | ICD-10-CM | POA: Diagnosis not present

## 2015-06-19 LAB — CBC WITH DIFFERENTIAL/PLATELET
BASOS PCT: 1 %
Basophils Absolute: 0.2 10*3/uL — ABNORMAL HIGH (ref 0.0–0.1)
EOS PCT: 1 %
Eosinophils Absolute: 0.2 10*3/uL (ref 0.0–0.7)
HEMATOCRIT: 17.6 % — AB (ref 39.0–52.0)
HEMOGLOBIN: 6.2 g/dL — AB (ref 13.0–17.0)
LYMPHS ABS: 1.6 10*3/uL (ref 0.7–4.0)
Lymphocytes Relative: 9 %
MCH: 31.2 pg (ref 26.0–34.0)
MCHC: 35.2 g/dL (ref 30.0–36.0)
MCV: 88.4 fL (ref 78.0–100.0)
MONO ABS: 2.3 10*3/uL — AB (ref 0.1–1.0)
Monocytes Relative: 13 %
NEUTROS PCT: 76 %
Neutro Abs: 13.7 10*3/uL — ABNORMAL HIGH (ref 1.7–7.7)
Platelets: 539 10*3/uL — ABNORMAL HIGH (ref 150–400)
RBC: 1.99 MIL/uL — ABNORMAL LOW (ref 4.22–5.81)
RDW: 23.3 % — AB (ref 11.5–15.5)
WBC: 18 10*3/uL — ABNORMAL HIGH (ref 4.0–10.5)

## 2015-06-19 LAB — COMPREHENSIVE METABOLIC PANEL
ALK PHOS: 72 U/L (ref 38–126)
ALT: 22 U/L (ref 17–63)
AST: 44 U/L — AB (ref 15–41)
Albumin: 4.6 g/dL (ref 3.5–5.0)
Anion gap: 6 (ref 5–15)
BILIRUBIN TOTAL: 5.4 mg/dL — AB (ref 0.3–1.2)
BUN: 20 mg/dL (ref 6–20)
CALCIUM: 8.8 mg/dL — AB (ref 8.9–10.3)
CO2: 20 mmol/L — ABNORMAL LOW (ref 22–32)
CREATININE: 0.96 mg/dL (ref 0.61–1.24)
Chloride: 108 mmol/L (ref 101–111)
GFR calc Af Amer: >60 mL/min (ref >60)
Glucose, Bld: 106 mg/dL — ABNORMAL HIGH (ref 65–99)
POTASSIUM: 4.5 mmol/L (ref 3.5–5.1)
Sodium: 134 mmol/L — ABNORMAL LOW (ref 135–145)
TOTAL PROTEIN: 8.1 g/dL (ref 6.5–8.1)

## 2015-06-19 LAB — RETICULOCYTES
RBC.: 1.99 MIL/uL — ABNORMAL LOW (ref 4.22–5.81)
RETIC CT PCT: 8 % — AB (ref 0.4–3.1)
Retic Count, Absolute: 159.2 10*3/uL (ref 19.0–186.0)

## 2015-06-19 MED ORDER — DEXTROSE-NACL 5-0.45 % IV SOLN
INTRAVENOUS | Status: DC
  Administered 2015-06-19: 14:00:00 via INTRAVENOUS

## 2015-06-19 MED ORDER — TECHNETIUM TO 99M ALBUMIN AGGREGATED
4.3000 | Freq: Once | INTRAVENOUS | Status: AC | PRN
  Administered 2015-06-19: 4.3 via INTRAVENOUS

## 2015-06-19 MED ORDER — SODIUM CHLORIDE 0.9% FLUSH: 9.0000 mL | INTRAVENOUS | Status: DC | PRN

## 2015-06-19 MED ORDER — TECHNETIUM TC 99M DIETHYLENETRIAME-PENTAACETIC ACID
33.0000 | Freq: Once | INTRAVENOUS | Status: AC | PRN
  Administered 2015-06-19: 33 via INTRAVENOUS

## 2015-06-19 MED ORDER — SODIUM CHLORIDE 0.9% FLUSH: 10.0000 mL | INTRAVENOUS | Status: DC | PRN

## 2015-06-19 MED ORDER — HYDROMORPHONE 1 MG/ML IV SOLN
INTRAVENOUS | Status: DC
  Administered 2015-06-19: 14:00:00 via INTRAVENOUS
  Filled 2015-06-19: qty 25

## 2015-06-19 MED ORDER — NALOXONE HCL 0.4 MG/ML IJ SOLN: 0.4000 mg | INTRAMUSCULAR | Status: DC | PRN

## 2015-06-19 MED ORDER — ACETAMINOPHEN 500 MG PO TABS
500.0000 mg | ORAL_TABLET | Freq: Once | ORAL | Status: AC
  Administered 2015-06-19: 500 mg via ORAL
  Filled 2015-06-19: qty 1

## 2015-06-19 MED ORDER — HEPARIN SOD (PORK) LOCK FLUSH 100 UNIT/ML IV SOLN
500.0000 [IU] | INTRAVENOUS | Status: DC | PRN
  Administered 2015-06-19: 500 [IU]

## 2015-06-19 MED ORDER — ONDANSETRON HCL 4 MG/2ML IJ SOLN: 4.0000 mg | Freq: Four times a day (QID) | INTRAMUSCULAR | Status: DC | PRN

## 2015-06-19 MED ORDER — DIPHENHYDRAMINE HCL 12.5 MG/5ML PO ELIX: 12.5000 mg | ORAL_SOLUTION | Freq: Four times a day (QID) | ORAL | Status: DC | PRN

## 2015-06-19 MED ORDER — SODIUM CHLORIDE 0.9 % IV SOLN
12.5000 mg | Freq: Four times a day (QID) | INTRAVENOUS | Status: DC | PRN
  Filled 2015-06-19: qty 0.25

## 2015-06-19 MED ORDER — TECHNETIUM TC 99M DIETHYLENETRIAME-PENTAACETIC ACID: 33.0000 | Freq: Once | INTRAVENOUS | Status: DC | PRN

## 2015-06-19 NOTE — Progress Notes (Signed)
Patient ID: [name], male   DOB: July 14, 1980, 35 y.o.   MRN: [id number] Pt of Dr.Jegede arrived to [facility] with pain of 9/10 in his rib cage. Pt received fluids, Incentive spirometer, PCA infusion. Pt tolerated procedure well; Port-a-cath was flushed, deaccessed. Pt is ambulatory upon completion. Pain level decreased to 5/10. Discharged to home.   Klay Sobotka, Eustaquio Maize

## 2015-06-19 NOTE — Telephone Encounter (Signed)
Pt called with complaint of 9/10 pain in his R side; denied CP, N/V/D, fever, abd pain, priapism. Let pt know that will call provider and call him back. Charlen Bakula, Eustaquio Maize

## 2015-06-19 NOTE — Progress Notes (Signed)
CRITICAL VALUE ALERT  Critical value received:  Hgb 6.2  Date of notification:  06/19/15  Time of notification:  [id number]  Critical value read back:Yes.    Nurse who received alert:  B. Tamala Julian  MD notified (1st page): L. Geoge Lawrance Robert, NP  Time of first page:  [phone]  MD notified (2nd page):  Time of second page:  Responding MD:  L. Joanmarie Tsang Robert, NP  Time MD responded:  1500

## 2015-06-19 NOTE — H&P (Signed)
[facility] History and Physical   Date: 06/19/2015  Patient name: [name] Medical record number: [id number] Date of birth: 1980-09-18 Age: 35 y.o. Gender: male PCP: [name], MD  Attending physician: Tresa Garter, MD  Chief Complaint: Left flank pain  History of Present Illness: Mr. Rube Bosworth, 35 year old male with a history of sickle cell anemia, HbSS presents with left flank pain that is consistent with sickle cell anemia. Patient reports that pain intensity has been increasing over the past several days.He has not identified any palliative or provocative factors.  He says that current pain intensity is 9/10 descried as constant and throbbing. He last had Dilaudid this morning around 8 am with minimal relief. He says that he has been taking all other medications consistently. He also reports that he has not been wearing home oxygen consistently. He denies headache, chest pain, dysuria, abdominal pain, nausea, vomiting, or diarrhea.   Meds: Prescriptions prior to admission  Medication Sig Dispense Refill Last Dose  . aspirin 81 MG chewable tablet Chew 1 tablet (81 mg total) by mouth daily. 30 tablet 11 06/18/2015 at Unknown time  . Cholecalciferol (VITAMIN D) 2000 units tablet Take 1 tablet (2,000 Units total) by mouth daily. 90 tablet 3 06/18/2015 at Unknown time  . cyclobenzaprine (FLEXERIL) 5 MG tablet Take 1 tablet (5 mg total) by mouth 3 (three) times daily as needed for muscle spasms. 20 tablet 0 06/18/2015 at Unknown time  . enoxaparin (LOVENOX) 120 MG/0.8ML injection Inject 0.8 mLs (120 mg total) into the skin daily. 30 Syringe 2 06/18/2015 at Unknown time  . folic acid (FOLVITE) 1 MG tablet Take 1 tablet (1 mg total) by mouth every morning. 30 tablet 11 06/18/2015 at Unknown time  . gabapentin (NEURONTIN) 300 MG capsule Take 1 capsule (300 mg total) by mouth 3 (three) times daily. 270 capsule 3 06/19/2015 at Unknown time  . HYDROmorphone  (DILAUDID) 4 MG tablet Take 1 tablet (4 mg total) by mouth every 4 (four) hours as needed for severe pain. 90 tablet 0 06/19/2015 at Unknown time  . hydroxyurea (HYDREA) 500 MG capsule Take 2 capsules (1,000 mg total) by mouth daily. May take with food to minimize GI side effects. 60 capsule 5 06/18/2015 at Unknown time  . lisinopril (PRINIVIL,ZESTRIL) 10 MG tablet Take 1 tablet (10 mg total) by mouth daily. 30 tablet 5 06/18/2015 at Unknown time  . metoprolol succinate (TOPROL-XL) 50 MG 24 hr tablet Take 1 tablet (50 mg total) by mouth daily. 30 tablet 3 06/18/2015 at Unknown time  . morphine (MS CONTIN) 30 MG 12 hr tablet Take 1 tablet (30 mg total) by mouth every 12 (twelve) hours. 60 tablet 0 06/19/2015 at Unknown time  . potassium chloride SA (K-DUR,KLOR-CON) 20 MEQ tablet Take 1 tablet (20 mEq total) by mouth every morning. (Patient taking differently: Take 20 mEq by mouth daily. ) 30 tablet 3 06/18/2015 at Unknown time  . zolpidem (AMBIEN) 10 MG tablet Take 1 tablet (10 mg total) by mouth at bedtime as needed for sleep. 30 tablet 0 06/18/2015 at Unknown time    Allergies: Review of patient's allergies indicates no known allergies. Past Medical History  Diagnosis Date  . Sickle cell anemia (HCC)   . Blood transfusion   . Acute embolism and thrombosis of right internal jugular vein ([place])   . Hypokalemia   . Mood disorder ([place])   . History of pulmonary embolus (PE)   . Avascular necrosis (  HCC)     Right Hip  . Leukocytosis     Chronic  . Thrombocytosis (HCC)     Chronic  . Hypertension   . History of Clostridium difficile infection   . Uses marijuana   . Chronic anticoagulation   . Functional asplenia   . Former smoker   . Second hand tobacco smoke exposure   . Alcohol consumption of one to four drinks per day   . Noncompliance with medication regimen   . Sickle-cell crisis with associated acute chest syndrome ([place]) 05/13/2013  . Acute chest syndrome ([place]) 06/18/2013  . Demand ischemia  ([place]) 01/02/2014  . Pulmonary hypertension ([place])   . Hb-SS disease with crisis ([place])   . Oxygen deficiency    Past Surgical History  Procedure Laterality Date  . Right hip replacement      08/2006  . Cholecystectomy      01/2008  . Porta cath placement    . Porta cath removal    . Umbilical hernia repair      01/2008  . Excision of left periauricular cyst      10/2009  . Excision of right ear lobe cyst with primary closur      11/2007  . Portacath placement  01/05/2012    Procedure: INSERTION PORT-A-CATH;  Surgeon: Odis Hollingshead, MD;  Location: [place];  Service: General;  Laterality: N/A;  ultrasound guiced port a cath insertion with fluoroscopy   Family History  Problem Relation Age of Onset  . Sickle cell trait Mother   . Depression Mother   . Diabetes Mother   . Sickle cell trait Father   . Sickle cell trait Brother    Social History   Social History  . Marital Status: Single    Spouse Name: N/A  . Number of Children: 0  . Years of Education: 13   Occupational History  . 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 Smoking status: Former Smoker -- 0.50 packs/day for 10 years    Types: Cigarettes    Quit date: 05/29/2011  . Smokeless tobacco: Never Used  . Alcohol Use: No  . Drug Use: No     Comment: Marijuana weekly  . Sexual Activity:    Partners: Female     Comment: month ago   Other Topics Concern  . Not on file   Social History Narrative   Lives in an apartment.  Single.  Lives alone but has a girlfriend that helps care for him.  Does not use any assist devices.        [name]  [phone] Mom, emergency contact      [facility]:   Patient continuing to live in her apartment in town alone. 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    Review of Systems: Constitutional: negative for fatigue Eyes: positive for icterus Ears, nose, mouth, throat, and face: negative Respiratory: positive  for cough Cardiovascular: negative for chest pain, dyspnea, fatigue, palpitations, syncope and tachypnea Gastrointestinal: negative for abdominal pain, diarrhea, dysphagia, nausea and vomiting Genitourinary:negative Integument/breast: negative Hematologic/lymphatic: negative Musculoskeletal:positive for myalgias Neurological: negative Behavioral/Psych: negative Endocrine: negative Allergic/Immunologic: negative  Physical Exam:  BP 113/68 mmHg  Pulse 85  Temp(Src) 99.5 F (37.5 C) (Oral)  Resp 18  SpO2 94%  General Appearance:    Alert, cooperative, mild distress, appears stated age  Head:    Normocephalic, without obvious abnormality, atraumatic  Eyes:    PERRL, conjunctiva/corneas clear, EOM's intact, fundi    benign, scleral icterus       Ears:    Normal TM's and external ear canals, both ears  Nose:   Nares normal, septum midline, mucosa normal, no drainage    or sinus tenderness  Throat:   Lips, mucosa, and tongue normal; teeth and gums normal  Neck:   Supple, symmetrical, trachea midline, no adenopathy;       thyroid:  No enlargement/tenderness/nodules; no carotid   bruit or JVD  Back:     Symmetric, no curvature, ROM normal, no CVA tenderness  Lungs:     Clear to auscultation bilaterally, respirations unlabored  Chest wall:    No tenderness or deformity  Heart:    Irregular rate and rhythm, S1 and S2 normal, no murmur, rub   or gallop  Abdomen:     Soft, non-tender, bowel sounds active all four quadrants,    no masses, no organomegaly  Extremities:   Extremities normal, atraumatic, no cyanosis or edema  Pulses:   2+ and symmetric all extremities  Skin:   Skin color, texture, turgor normal, no rashes or lesions  Lymph nodes:   Cervical, supraclavicular, and axillary nodes normal  Neurologic:   CNII-XII intact. Normal strength, sensation and reflexes      throughout    Lab results: No results found. However, due to the size of the patient record, not all encounters  were searched. Please check Results Review for a complete set of results.  Imaging results:  Dg Chest 2 View  06/19/2015  CLINICAL DATA:  Chest x-ray prior to be keep scan. Patient reports right-sided chest discomfort and nonproductive cough for 3 days. History of previous pulmonary embolism, former smoker, history of sickle cell anemia. EXAM: CHEST  2 VIEW COMPARISON:  PA and lateral chest x-ray of Jun 09, 2015 FINDINGS: The lungs are well-expanded. There is no focal infiltrate. The cardiac silhouette remains enlarged. The pulmonary vascularity is mildly prominent. There is no pleural effusion or pneumothorax. The mediastinum is normal in width. The power port appliance catheter tip projects over the midportion of the SVC. There is mild degenerative disc disease of the thoracic spine. IMPRESSION: COPD/ reactive airway disease. Stable cardiomegaly with mild central pulmonary vascular congestion. No evidence of pneumonia. Electronically Signed   By: David  Martinique M.D.   On: 06/19/2015 10:07   Nm Pulmonary Per & Vent  06/19/2015  CLINICAL DATA:  Shortness of breath for 1 month. History of sickle cell disease EXAM: NUCLEAR MEDICINE VENTILATION - PERFUSION LUNG SCAN Views: Anterior, posterior, left lateral, right lateral, RAO, RAO, LAO, LPO -ventilation and perfusion RADIOPHARMACEUTICALS:  33.0 mCi Technetium-48m DTPA aerosol inhalation and 4.3 mCi Technetium-35m MAA IV COMPARISON:  Chest radiograph Jun 19, 2015 FINDINGS: Ventilation: Radiotracer uptake is homogeneous and symmetric bilaterally. Cardiomegaly is noted. Perfusion: There is no segmental perfusion defect. There are a few rather equivocal subsegmental areas of decreased perfusion without significant ventilation/perfusion mismatch. Cardiomegaly is noted. IMPRESSION: Cardiomegaly noted. No significant ventilation/ perfusion mismatch. This study constitutes an overall low probability of pulmonary embolus. Electronically Signed   By: Lowella Grip III  M.D.   On: 06/19/2015 11:05     Assessment & Plan:  Patient will be admitted to the day infusion center for extended observation  Start IV D5.45 for cellular rehydration at 150 ml/hr  Start Dilaudid PCA High Concentration per weight based protocol.   Patient will be re-evaluated for pain intensity in the  context of function and relationship to baseline as care progresses.  If no significant pain relief, will transfer patient to inpatient services for a higher level of care.   Will check CMP, reticulocytes and CBC w/differential  Reviewed chest x-ray, no acute findings  Londin Antone M 06/19/2015, 1:48 PM

## 2015-06-19 NOTE — Telephone Encounter (Signed)
Notified pt that, per NP, he may come to the [facility] for evaluation; pt verbalizes understanding

## 2015-06-19 NOTE — Discharge Summary (Signed)
[facility] Discharge Summary   Patient ID: [name] MRN: [id number] DOB/AGE: 36-Jun-1982 36 y.o.  Admit date: 06/19/2015 Discharge date: 06/19/2015  Primary Care Physician:  [name], MD  Admission Diagnoses:  Active Problems:   * No active hospital problems. *  Discharge Medications:    Medication List    TAKE these medications        aspirin 81 MG chewable tablet  Chew 1 tablet (81 mg total) by mouth daily.     cyclobenzaprine 5 MG tablet  Commonly known as:  FLEXERIL  Take 1 tablet (5 mg total) by mouth 3 (three) times daily as needed for muscle spasms.     enoxaparin 120 MG/0.8ML injection  Commonly known as:  LOVENOX  Inject 0.8 mLs (120 mg total) into the skin daily.     folic acid 1 MG tablet  Commonly known as:  FOLVITE  Take 1 tablet (1 mg total) by mouth every morning.     gabapentin 300 MG capsule  Commonly known as:  NEURONTIN  Take 1 capsule (300 mg total) by mouth 3 (three) times daily.     HYDROmorphone 4 MG tablet  Commonly known as:  DILAUDID  Take 1 tablet (4 mg total) by mouth every 4 (four) hours as needed for severe pain.     hydroxyurea 500 MG capsule  Commonly known as:  HYDREA  Take 2 capsules (1,000 mg total) by mouth daily. May take with food to minimize GI side effects.     lisinopril 10 MG tablet  Commonly known as:  PRINIVIL,ZESTRIL  Take 1 tablet (10 mg total) by mouth daily.     metoprolol succinate 50 MG 24 hr tablet  Commonly known as:  TOPROL-XL  Take 1 tablet (50 mg total) by mouth daily.     morphine 30 MG 12 hr tablet  Commonly known as:  MS CONTIN  Take 1 tablet (30 mg total) by mouth every 12 (twelve) hours.     potassium chloride SA 20 MEQ tablet  Commonly known as:  K-DUR,KLOR-CON  Take 1 tablet (20 mEq total) by mouth every morning.     Vitamin D 2000 units tablet  Take 1 tablet (2,000 Units total) by mouth daily.     zolpidem 10 MG tablet  Commonly known as:  AMBIEN   Take 1 tablet (10 mg total) by mouth at bedtime as needed for sleep.         Consults:  None  Significant Diagnostic Studies:  Dg Chest 2 View  06/19/2015  CLINICAL DATA:  Chest x-ray prior to be keep scan. Patient reports right-sided chest discomfort and nonproductive cough for 3 days. History of previous pulmonary embolism, former smoker, history of sickle cell anemia. EXAM: CHEST  2 VIEW COMPARISON:  PA and lateral chest x-ray of Jun 09, 2015 FINDINGS: The lungs are well-expanded. There is no focal infiltrate. The cardiac silhouette remains enlarged. The pulmonary vascularity is mildly prominent. There is no pleural effusion or pneumothorax. The mediastinum is normal in width. The power port appliance catheter tip projects over the midportion of the SVC. There is mild degenerative disc disease of the thoracic spine. IMPRESSION: COPD/ reactive airway disease. Stable cardiomegaly with mild central pulmonary vascular congestion. No evidence of pneumonia. Electronically Signed   By: David  Martinique M.D.   On: 06/19/2015 10:07   Dg Chest 2 View  06/09/2015  CLINICAL DATA:  Cough and body aches for the past 2 days,  no shortness of breath, history of pulmonary embolism, sickle cell anemia EXAM: CHEST  2 VIEW COMPARISON:  PA and lateral chest x-ray of Jun 02, 2015 FINDINGS: The lungs are adequately inflated. The cardiopericardial silhouette remains enlarged. The pulmonary vascularity remains engorged but is slightly less the bony thorax exhibits no acute abnormality. There IMPRESSION: COPD, low-grade CHF. No alveolar pneumonia. If the patient has elevated white blood cell count or fever, follow-up PA and lateral chest X-ray is recommended in 3-4 weeks following trial of antibiotic therapy. Electronically Signed   By: David  Martinique M.D.   On: 06/09/2015 12:55   Dg Chest 2 View  06/02/2015  CLINICAL DATA:  Sickle cell anemia. Left-sided chest pain for 1 day. EXAM: CHEST  2 VIEW COMPARISON:  06/01/2015 FINDINGS:  There is an enlarged cardiac silhouette, stable from 06/01/2015. No pleural effusions. No airspace consolidation. Mild vascular prominence, unchanged. Airway is unremarkable. There is a left subclavian Port-A-Cath with tip at the cavoatrial junction. IMPRESSION: Enlarged cardiac silhouette, stable. No interval change from 06/01/2015. Electronically Signed   By: Andreas Newport M.D.   On: 06/02/2015 18:47   Dg Chest 2 View  06/01/2015  CLINICAL DATA:  Rib cage pain and mid back pain. Sickle cell crisis. EXAM: CHEST  2 VIEW COMPARISON:  05/07/2015 FINDINGS: Chronic cardiomegaly. Pulmonary vascularity is normal. No infiltrates or effusions. Power port in place. No bone abnormality. IMPRESSION: No acute abnormality.  Chronic cardiomegaly. Electronically Signed   By: Lorriane Shire M.D.   On: 06/01/2015 10:08   Ct Abdomen Pelvis W Contrast  06/03/2015  CLINICAL DATA:  Fever flank pain and leukocytosis. EXAM: CT ABDOMEN AND PELVIS WITH CONTRAST TECHNIQUE: Multidetector CT imaging of the abdomen and pelvis was performed using the standard protocol following bolus administration of intravenous contrast. CONTRAST:  179mL ISOVUE-300 IOPAMIDOL (ISOVUE-300) INJECTION 61% COMPARISON:  06/01/2009 FINDINGS: Lower chest: Ventriculomegaly, unchanged. Chronic scarring in both lungs. Hepatobiliary: Cholecystectomy. No focal liver lesions. No bile duct dilatation. Pancreas: Normal Spleen: Small and calcified, unchanged. Adrenals/Urinary Tract: The adrenals and kidneys are normal in appearance. There is no urinary calculus evident. There is no hydronephrosis or ureteral dilatation. Collecting systems and ureters appear unremarkable. Stomach/Bowel: There are normal appearances of the stomach, small bowel and colon. The appendix is normal. Vascular/Lymphatic: The abdominal aorta is normal in caliber. There is mild atherosclerotic calcification. There is no pathologically enlarged adenopathy in the abdomen or pelvis. There is a  prominent number of small periaortic nodes, unchanged. Reproductive: Unremarkable Other: No acute inflammatory changes are evident in the abdomen or pelvis. There is no ascites. Musculoskeletal: Multiple bone infarcts. 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 Incidental umbilical hernia, nonobstructive. IMPRESSION: No acute findings are evident in the abdomen or pelvis. 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 Cardiomegaly. The findings are all typical of sickle cell disease. Electronically Signed   By: Andreas Newport M.D.   On: 06/03/2015 00:47   Nm Pulmonary Per & Vent  06/19/2015  CLINICAL DATA:  Shortness of breath for 1 month. History of sickle cell disease EXAM: NUCLEAR MEDICINE VENTILATION - PERFUSION LUNG SCAN Views: Anterior, posterior, left lateral, right lateral, RAO, RAO, LAO, LPO -ventilation and perfusion RADIOPHARMACEUTICALS:  33.0 mCi Technetium-56m DTPA aerosol inhalation and 4.3 mCi Technetium-23m MAA IV COMPARISON:  Chest radiograph Jun 19, 2015 FINDINGS: Ventilation: Radiotracer uptake is homogeneous and symmetric bilaterally. Cardiomegaly is noted. Perfusion: There is no segmental perfusion defect. There are a few rather equivocal subsegmental areas of decreased perfusion without significant  ventilation/perfusion mismatch. Cardiomegaly is noted. IMPRESSION: Cardiomegaly noted. No significant ventilation/ perfusion mismatch. This study constitutes an overall low probability of pulmonary embolus. Electronically Signed   By: Lowella Grip III M.D.   On: 06/19/2015 11:05     [facility] Course: [name], was admitted to the day infusion center for extended observation.  Continued oxygen at 2 liters.  Reviewed labs, leukocytosis present He was started on high concentration PCA dilaudid. He used a total of 10.1 mg with 16 demands and 13 deliveries. Pain intensity decreased to 5/10.  [name] says that he can manage at home on  current medication regimen. Reminded to continue to hydrate consistently. Also, continue home oxygen.  Patient was reminded to follow up in office as scheduled.  The patient was given clear instructions to go to ER or return to medical center if symptoms do not improve, worsen or new problems develop. The patient verbalized understanding.   Physical Exam at Discharge:  BP 113/68 mmHg  Pulse 85  Temp(Src) 99.5 F (37.5 C) (Oral)  Resp 18  SpO2 94%  General Appearance:    Alert, cooperative, no distress, appears stated age  Head:    Normocephalic, without obvious abnormality, atraumatic  Eyes:    PERRL, conjunctiva/corneas clear, EOM's intact, fundi    benign, both eyes       Lungs:     Clear to auscultation bilaterally, respirations unlabored  Chest wall:    No tenderness or deformity  Heart:    Regular rate and rhythm, S1 and S2 normal, no murmur, rub   or gallop  Abdomen:     Soft, non-tender, bowel sounds active all four quadrants,    no masses, no organomegaly  Pulses:   2+ and symmetric all extremities  Skin:   Skin color, texture, turgor normal, no rashes or lesions  Lymph nodes:   Cervical, supraclavicular, and axillary nodes normal  Neurologic:   CNII-XII intact. Normal strength, sensation and reflexes      throughout   Disposition at Discharge: 01-Home or Self Care  Discharge Orders: Discharge Instructions    Discharge patient    Complete by:  As directed            Condition at Discharge:   Stable  Time spent on Discharge:  15 minutes  Signed: Hollis,Lachina M 06/19/2015, 4:33 PM

## 2015-06-19 NOTE — Discharge Instructions (Signed)
Sickle Cell Anemia, Adult Sickle cell anemia is a condition in which red blood cells have an abnormal "sickle" shape. This abnormal shape shortens the cells' life span, which results in a lower than normal concentration of red blood cells in the blood. The sickle shape also causes the cells to clump together and block free blood flow through the blood vessels. As a result, the tissues and organs of the body do not receive enough oxygen. Sickle cell anemia causes organ damage and pain and increases the risk of infection. CAUSES  Sickle cell anemia is a genetic disorder. Those who receive two copies of the gene have the condition, and those who receive one copy have the trait. RISK FACTORS The sickle cell gene is most common in people whose families originated in [place]. Other areas of the globe where sickle cell trait occurs include the [place], [place] and [place], the [place], and the [place].  SIGNS AND SYMPTOMS  Pain, especially in the extremities, back, chest, or abdomen (common). The pain may start suddenly or may develop following an illness, especially if there is dehydration. Pain can also occur due to overexertion or exposure to extreme temperature changes.  Frequent severe bacterial infections, especially certain types of pneumonia and meningitis.  Pain and swelling in the hands and feet.  Decreased activity.   Loss of appetite.   Change in behavior.  Headaches.  Seizures.  Shortness of breath or difficulty breathing.  Vision changes.  Skin ulcers. Those with the trait may not have symptoms or they may have mild symptoms.  DIAGNOSIS  Sickle cell anemia is diagnosed with blood tests that demonstrate the genetic trait. It is often diagnosed during the newborn period, due to mandatory testing nationwide. A variety of blood tests, X-rays, CT scans, MRI scans, ultrasounds, and lung function tests may also be done to monitor the condition. TREATMENT  Sickle  cell anemia may be treated with:  Medicines. You may be given pain medicines, antibiotic medicines (to treat and prevent infections) or medicines to increase the production of certain types of hemoglobin.  Fluids.  Oxygen.  Blood transfusions. HOME CARE INSTRUCTIONS   Drink enough fluid to keep your urine clear or pale yellow. Increase your fluid intake in hot weather and during exercise.  Do not smoke. Smoking lowers oxygen levels in the blood.   Only take over-the-counter or prescription medicines for pain, fever, or discomfort as directed by your health care provider.  Take antibiotics as directed by your health care provider. Make sure you finish them it even if you start to feel better.   Take supplements as directed by your health care provider.   Consider wearing a medical alert bracelet. This tells anyone caring for you in an emergency of your condition.   When traveling, keep your medical information, health care provider's names, and the medicines you take with you at all times.   If you develop a fever, do not take medicines to reduce the fever right away. This could cover up a problem that is developing. Notify your health care provider.  Keep all follow-up appointments with your health care provider. Sickle cell anemia requires regular medical care. SEEK MEDICAL CARE IF: You have a fever. SEEK IMMEDIATE MEDICAL CARE IF:   You feel dizzy or faint.   You have new abdominal pain, especially on the left side near the stomach area.   You develop a persistent, often uncomfortable and painful penile erection (priapism). If this is not treated immediately it   will lead to impotence.   You have numbness your arms or legs or you have a hard time moving them.   You have a hard time with speech.   You have a fever or persistent symptoms for more than 2-3 days.   You have a fever and your symptoms suddenly get worse.   You have signs or symptoms of infection.  These include:   Chills.   Abnormal tiredness (lethargy).   Irritability.   Poor eating.   Vomiting.   You develop pain that is not helped with medicine.   You develop shortness of breath.  You have pain in your chest.   You are coughing up pus-like or bloody sputum.   You develop a stiff neck.  Your feet or hands swell or have pain.  Your abdomen appears bloated.  You develop joint pain. MAKE SURE YOU:  Understand these instructions.   This information is not intended to replace advice given to you by your health care provider. Make sure you discuss any questions you have with your health care provider.   Document Released: 04/27/2005 Document Revised: 02/07/2014 Document Reviewed: 08/29/2012 Elsevier Interactive Patient Education 2016 [employer]  

## 2015-06-22 ENCOUNTER — Encounter (HOSPITAL_COMMUNITY): Payer: Self-pay

## 2015-06-22 DIAGNOSIS — Z86718 Personal history of other venous thrombosis and embolism: Secondary | ICD-10-CM | POA: Diagnosis not present

## 2015-06-22 DIAGNOSIS — Z9981 Dependence on supplemental oxygen: Secondary | ICD-10-CM | POA: Diagnosis not present

## 2015-06-22 DIAGNOSIS — R0902 Hypoxemia: Secondary | ICD-10-CM | POA: Diagnosis not present

## 2015-06-22 DIAGNOSIS — Z96641 Presence of right artificial hip joint: Secondary | ICD-10-CM | POA: Diagnosis not present

## 2015-06-22 DIAGNOSIS — R109 Unspecified abdominal pain: Secondary | ICD-10-CM | POA: Diagnosis not present

## 2015-06-22 DIAGNOSIS — J811 Chronic pulmonary edema: Secondary | ICD-10-CM | POA: Diagnosis not present

## 2015-06-22 DIAGNOSIS — D571 Sickle-cell disease without crisis: Secondary | ICD-10-CM | POA: Diagnosis not present

## 2015-06-22 DIAGNOSIS — G8929 Other chronic pain: Secondary | ICD-10-CM | POA: Diagnosis not present

## 2015-06-22 DIAGNOSIS — Z9081 Acquired absence of spleen: Secondary | ICD-10-CM | POA: Diagnosis not present

## 2015-06-22 DIAGNOSIS — I361 Nonrheumatic tricuspid (valve) insufficiency: Secondary | ICD-10-CM | POA: Diagnosis not present

## 2015-06-22 DIAGNOSIS — Z79899 Other long term (current) drug therapy: Secondary | ICD-10-CM | POA: Diagnosis not present

## 2015-06-22 DIAGNOSIS — D7589 Other specified diseases of blood and blood-forming organs: Secondary | ICD-10-CM | POA: Diagnosis not present

## 2015-06-22 DIAGNOSIS — R0789 Other chest pain: Secondary | ICD-10-CM | POA: Diagnosis not present

## 2015-06-22 DIAGNOSIS — I2781 Cor pulmonale (chronic): Secondary | ICD-10-CM | POA: Diagnosis not present

## 2015-06-22 DIAGNOSIS — R05 Cough: Secondary | ICD-10-CM | POA: Diagnosis not present

## 2015-06-22 DIAGNOSIS — I110 Hypertensive heart disease with heart failure: Secondary | ICD-10-CM | POA: Diagnosis not present

## 2015-06-22 DIAGNOSIS — I2699 Other pulmonary embolism without acute cor pulmonale: Secondary | ICD-10-CM | POA: Diagnosis not present

## 2015-06-22 DIAGNOSIS — Z7901 Long term (current) use of anticoagulants: Secondary | ICD-10-CM | POA: Diagnosis not present

## 2015-06-22 DIAGNOSIS — I272 Other secondary pulmonary hypertension: Secondary | ICD-10-CM | POA: Diagnosis not present

## 2015-06-22 DIAGNOSIS — Z87891 Personal history of nicotine dependence: Secondary | ICD-10-CM | POA: Diagnosis not present

## 2015-06-22 DIAGNOSIS — F39 Unspecified mood [affective] disorder: Secondary | ICD-10-CM | POA: Diagnosis not present

## 2015-06-22 DIAGNOSIS — J9811 Atelectasis: Secondary | ICD-10-CM | POA: Diagnosis not present

## 2015-06-22 DIAGNOSIS — Z8673 Personal history of transient ischemic attack (TIA), and cerebral infarction without residual deficits: Secondary | ICD-10-CM | POA: Diagnosis not present

## 2015-06-22 DIAGNOSIS — I739 Peripheral vascular disease, unspecified: Secondary | ICD-10-CM | POA: Diagnosis not present

## 2015-06-22 DIAGNOSIS — Z7982 Long term (current) use of aspirin: Secondary | ICD-10-CM | POA: Diagnosis not present

## 2015-06-22 DIAGNOSIS — J9611 Chronic respiratory failure with hypoxia: Secondary | ICD-10-CM | POA: Diagnosis not present

## 2015-06-22 DIAGNOSIS — I509 Heart failure, unspecified: Secondary | ICD-10-CM | POA: Diagnosis not present

## 2015-06-22 DIAGNOSIS — M9080 Osteopathy in diseases classified elsewhere, unspecified site: Secondary | ICD-10-CM | POA: Diagnosis not present

## 2015-06-22 DIAGNOSIS — D5700 Hb-SS disease with crisis, unspecified: Secondary | ICD-10-CM | POA: Diagnosis not present

## 2015-06-22 DIAGNOSIS — R062 Wheezing: Secondary | ICD-10-CM | POA: Diagnosis not present

## 2015-06-22 DIAGNOSIS — F919 Conduct disorder, unspecified: Secondary | ICD-10-CM | POA: Diagnosis not present

## 2015-06-22 DIAGNOSIS — R9431 Abnormal electrocardiogram [ECG] [EKG]: Secondary | ICD-10-CM | POA: Diagnosis not present

## 2015-06-22 DIAGNOSIS — I2782 Chronic pulmonary embolism: Secondary | ICD-10-CM | POA: Diagnosis not present

## 2015-06-22 DIAGNOSIS — I517 Cardiomegaly: Secondary | ICD-10-CM | POA: Diagnosis not present

## 2015-06-23 DIAGNOSIS — I2699 Other pulmonary embolism without acute cor pulmonale: Secondary | ICD-10-CM | POA: Diagnosis not present

## 2015-06-23 DIAGNOSIS — I509 Heart failure, unspecified: Secondary | ICD-10-CM | POA: Diagnosis present

## 2015-06-23 DIAGNOSIS — I110 Hypertensive heart disease with heart failure: Secondary | ICD-10-CM | POA: Diagnosis present

## 2015-06-23 DIAGNOSIS — M9080 Osteopathy in diseases classified elsewhere, unspecified site: Secondary | ICD-10-CM | POA: Diagnosis present

## 2015-06-23 DIAGNOSIS — Z87891 Personal history of nicotine dependence: Secondary | ICD-10-CM | POA: Diagnosis not present

## 2015-06-23 DIAGNOSIS — I272 Other secondary pulmonary hypertension: Secondary | ICD-10-CM | POA: Diagnosis present

## 2015-06-23 DIAGNOSIS — D7589 Other specified diseases of blood and blood-forming organs: Secondary | ICD-10-CM | POA: Diagnosis present

## 2015-06-23 DIAGNOSIS — F39 Unspecified mood [affective] disorder: Secondary | ICD-10-CM | POA: Diagnosis present

## 2015-06-23 DIAGNOSIS — Z9981 Dependence on supplemental oxygen: Secondary | ICD-10-CM | POA: Diagnosis not present

## 2015-06-23 DIAGNOSIS — Z9119 Patient's noncompliance with other medical treatment and regimen: Secondary | ICD-10-CM | POA: Diagnosis not present

## 2015-06-23 DIAGNOSIS — J9691 Respiratory failure, unspecified with hypoxia: Secondary | ICD-10-CM | POA: Diagnosis not present

## 2015-06-23 DIAGNOSIS — I517 Cardiomegaly: Secondary | ICD-10-CM | POA: Diagnosis not present

## 2015-06-23 DIAGNOSIS — Z8673 Personal history of transient ischemic attack (TIA), and cerebral infarction without residual deficits: Secondary | ICD-10-CM | POA: Diagnosis not present

## 2015-06-23 DIAGNOSIS — Z7982 Long term (current) use of aspirin: Secondary | ICD-10-CM | POA: Diagnosis not present

## 2015-06-23 DIAGNOSIS — I2782 Chronic pulmonary embolism: Secondary | ICD-10-CM | POA: Diagnosis not present

## 2015-06-23 DIAGNOSIS — Z7901 Long term (current) use of anticoagulants: Secondary | ICD-10-CM | POA: Diagnosis not present

## 2015-06-23 DIAGNOSIS — I361 Nonrheumatic tricuspid (valve) insufficiency: Secondary | ICD-10-CM | POA: Diagnosis present

## 2015-06-23 DIAGNOSIS — I2781 Cor pulmonale (chronic): Secondary | ICD-10-CM | POA: Diagnosis present

## 2015-06-23 DIAGNOSIS — I739 Peripheral vascular disease, unspecified: Secondary | ICD-10-CM | POA: Diagnosis present

## 2015-06-23 DIAGNOSIS — G8929 Other chronic pain: Secondary | ICD-10-CM | POA: Diagnosis present

## 2015-06-23 DIAGNOSIS — D5700 Hb-SS disease with crisis, unspecified: Secondary | ICD-10-CM | POA: Diagnosis present

## 2015-06-23 DIAGNOSIS — Z96641 Presence of right artificial hip joint: Secondary | ICD-10-CM | POA: Diagnosis present

## 2015-06-23 DIAGNOSIS — J9611 Chronic respiratory failure with hypoxia: Secondary | ICD-10-CM | POA: Diagnosis present

## 2015-06-23 DIAGNOSIS — Z86718 Personal history of other venous thrombosis and embolism: Secondary | ICD-10-CM | POA: Diagnosis not present

## 2015-06-23 DIAGNOSIS — Z79899 Other long term (current) drug therapy: Secondary | ICD-10-CM | POA: Diagnosis not present

## 2015-06-23 DIAGNOSIS — Z9081 Acquired absence of spleen: Secondary | ICD-10-CM | POA: Diagnosis not present

## 2015-06-23 DIAGNOSIS — J9811 Atelectasis: Secondary | ICD-10-CM | POA: Diagnosis present

## 2015-06-23 DIAGNOSIS — F919 Conduct disorder, unspecified: Secondary | ICD-10-CM | POA: Diagnosis present

## 2015-07-01 ENCOUNTER — Ambulatory Visit: Payer: Self-pay | Admitting: Pulmonary Disease

## 2015-07-02 ENCOUNTER — Telehealth: Payer: Self-pay | Admitting: Internal Medicine

## 2015-07-02 NOTE — Telephone Encounter (Signed)
Pt's girlfriend Maren Reamer called to let me know that patient was hospitalized at [facility] with a PE. He had a Hb Electrophoresis which showed Hb S at 39.6%. Kamia expressed that she did not understand why he was being managed by Pain Service instead of [facility]. I advised her that at a % Hb S of 39.6% this was the profile of someone with trait.  I explained that in a patient with SCD at  a % Hb S this low,  the pain could not then be attributed to Sickle Cell. She reported that Dr. Manuella Ghazi had endorsed the same explanaition.  She reported that Dr. Manuella Ghazi also started patient on Jadenu for iron overload. I have notified Dr. Doreene Burke of the information.

## 2015-07-06 ENCOUNTER — Inpatient Hospital Stay (HOSPITAL_COMMUNITY)
Admission: AD | Admit: 2015-07-06 | Discharge: 2015-07-12 | DRG: 811 | Disposition: A | Discharge status: Home / Self Care | Payer: Medicare Other | Source: Ambulatory Visit | Attending: Internal Medicine | Admitting: Internal Medicine

## 2015-07-06 ENCOUNTER — Telehealth (HOSPITAL_COMMUNITY): Payer: Self-pay | Admitting: Hematology

## 2015-07-06 ENCOUNTER — Encounter (HOSPITAL_COMMUNITY): Payer: Self-pay

## 2015-07-06 ENCOUNTER — Encounter (HOSPITAL_COMMUNITY): Payer: Self-pay | Admitting: Emergency Medicine

## 2015-07-06 ENCOUNTER — Non-Acute Institutional Stay (HOSPITAL_COMMUNITY): Payer: Medicare Other

## 2015-07-06 ENCOUNTER — Emergency Department (HOSPITAL_COMMUNITY)
Admission: EM | Admit: 2015-07-06 | Discharge: 2015-07-06 | Disposition: A | Discharge status: Home / Self Care | Payer: Medicare Other | Source: Home / Self Care | Attending: Emergency Medicine | Admitting: Emergency Medicine

## 2015-07-06 ENCOUNTER — Telehealth: Payer: Self-pay

## 2015-07-06 DIAGNOSIS — Z96641 Presence of right artificial hip joint: Secondary | ICD-10-CM

## 2015-07-06 DIAGNOSIS — Z79899 Other long term (current) drug therapy: Secondary | ICD-10-CM | POA: Insufficient documentation

## 2015-07-06 DIAGNOSIS — G8929 Other chronic pain: Secondary | ICD-10-CM | POA: Diagnosis present

## 2015-07-06 DIAGNOSIS — I10 Essential (primary) hypertension: Secondary | ICD-10-CM | POA: Insufficient documentation

## 2015-07-06 DIAGNOSIS — D638 Anemia in other chronic diseases classified elsewhere: Secondary | ICD-10-CM | POA: Diagnosis present

## 2015-07-06 DIAGNOSIS — J9621 Acute and chronic respiratory failure with hypoxia: Secondary | ICD-10-CM | POA: Diagnosis present

## 2015-07-06 DIAGNOSIS — Z7901 Long term (current) use of anticoagulants: Secondary | ICD-10-CM | POA: Diagnosis not present

## 2015-07-06 DIAGNOSIS — E46 Unspecified protein-calorie malnutrition: Secondary | ICD-10-CM | POA: Diagnosis present

## 2015-07-06 DIAGNOSIS — I272 Other secondary pulmonary hypertension: Secondary | ICD-10-CM | POA: Diagnosis not present

## 2015-07-06 DIAGNOSIS — D5700 Hb-SS disease with crisis, unspecified: Secondary | ICD-10-CM | POA: Diagnosis not present

## 2015-07-06 DIAGNOSIS — J209 Acute bronchitis, unspecified: Secondary | ICD-10-CM | POA: Diagnosis not present

## 2015-07-06 DIAGNOSIS — Z7982 Long term (current) use of aspirin: Secondary | ICD-10-CM

## 2015-07-06 DIAGNOSIS — Z87891 Personal history of nicotine dependence: Secondary | ICD-10-CM

## 2015-07-06 DIAGNOSIS — R0902 Hypoxemia: Secondary | ICD-10-CM

## 2015-07-06 DIAGNOSIS — Z9981 Dependence on supplemental oxygen: Secondary | ICD-10-CM | POA: Diagnosis not present

## 2015-07-06 DIAGNOSIS — I2699 Other pulmonary embolism without acute cor pulmonale: Secondary | ICD-10-CM | POA: Diagnosis present

## 2015-07-06 DIAGNOSIS — R0602 Shortness of breath: Secondary | ICD-10-CM | POA: Diagnosis not present

## 2015-07-06 DIAGNOSIS — I2782 Chronic pulmonary embolism: Secondary | ICD-10-CM | POA: Diagnosis not present

## 2015-07-06 DIAGNOSIS — R079 Chest pain, unspecified: Secondary | ICD-10-CM | POA: Diagnosis not present

## 2015-07-06 LAB — CBC WITH DIFFERENTIAL/PLATELET
Basophils Absolute: 0 10*3/uL (ref 0.0–0.1)
Basophils Relative: 0 %
EOS ABS: 0.1 10*3/uL (ref 0.0–0.7)
Eosinophils Relative: 1 %
HEMATOCRIT: 18.2 % — AB (ref 39.0–52.0)
HEMOGLOBIN: 6.5 g/dL — AB (ref 13.0–17.0)
LYMPHS ABS: 1.5 10*3/uL (ref 0.7–4.0)
Lymphocytes Relative: 13 %
MCH: 33.2 pg (ref 26.0–34.0)
MCHC: 35.7 g/dL (ref 30.0–36.0)
MCV: 92.9 fL (ref 78.0–100.0)
MONOS PCT: 10 %
Monocytes Absolute: 1.2 10*3/uL — ABNORMAL HIGH (ref 0.1–1.0)
Neutro Abs: 9 10*3/uL — ABNORMAL HIGH (ref 1.7–7.7)
Neutrophils Relative %: 76 %
Platelets: 550 10*3/uL — ABNORMAL HIGH (ref 150–400)
RBC: 1.96 MIL/uL — ABNORMAL LOW (ref 4.22–5.81)
RDW: 22.5 % — ABNORMAL HIGH (ref 11.5–15.5)
WBC: 11.8 10*3/uL — AB (ref 4.0–10.5)

## 2015-07-06 LAB — COMPREHENSIVE METABOLIC PANEL
ALK PHOS: 68 U/L (ref 38–126)
ALT: 23 U/L (ref 17–63)
AST: 35 U/L (ref 15–41)
Albumin: 4.3 g/dL (ref 3.5–5.0)
Anion gap: 9 (ref 5–15)
BILIRUBIN TOTAL: 3.8 mg/dL — AB (ref 0.3–1.2)
BUN: 19 mg/dL (ref 6–20)
CALCIUM: 9.3 mg/dL (ref 8.9–10.3)
CO2: 19 mmol/L — AB (ref 22–32)
CREATININE: 0.96 mg/dL (ref 0.61–1.24)
Chloride: 109 mmol/L (ref 101–111)
GFR calc non Af Amer: >60 mL/min (ref >60)
Glucose, Bld: 123 mg/dL — ABNORMAL HIGH (ref 65–99)
Potassium: 3.9 mmol/L (ref 3.5–5.1)
SODIUM: 137 mmol/L (ref 135–145)
TOTAL PROTEIN: 8.3 g/dL — AB (ref 6.5–8.1)

## 2015-07-06 LAB — RETICULOCYTES
RBC.: 1.96 MIL/uL — AB (ref 4.22–5.81)
Retic Count, Absolute: 333.2 10*3/uL — ABNORMAL HIGH (ref 19.0–186.0)
Retic Ct Pct: 17 % — ABNORMAL HIGH (ref 0.4–3.1)

## 2015-07-06 MED ORDER — NALOXONE HCL 0.4 MG/ML IJ SOLN: 0.4000 mg | INTRAMUSCULAR | Status: DC | PRN

## 2015-07-06 MED ORDER — METOPROLOL SUCCINATE ER 25 MG PO TB24
25.0000 mg | ORAL_TABLET | Freq: Every day | ORAL | Status: DC
  Administered 2015-07-07 – 2015-07-12 (×6): 25 mg via ORAL
  Filled 2015-07-06 (×6): qty 1

## 2015-07-06 MED ORDER — LISINOPRIL 10 MG PO TABS
10.0000 mg | ORAL_TABLET | Freq: Every day | ORAL | Status: DC
  Administered 2015-07-07 – 2015-07-12 (×6): 10 mg via ORAL
  Filled 2015-07-06 (×6): qty 1

## 2015-07-06 MED ORDER — VITAMIN D 1000 UNITS PO TABS
2000.0000 [IU] | ORAL_TABLET | Freq: Every day | ORAL | Status: DC
  Administered 2015-07-07 – 2015-07-12 (×6): 2000 [IU] via ORAL
  Filled 2015-07-06 (×6): qty 2

## 2015-07-06 MED ORDER — KETOROLAC TROMETHAMINE 30 MG/ML IJ SOLN
30.0000 mg | Freq: Four times a day (QID) | INTRAMUSCULAR | Status: DC
  Filled 2015-07-06 (×2): qty 1

## 2015-07-06 MED ORDER — FOLIC ACID 1 MG PO TABS
1.0000 mg | ORAL_TABLET | Freq: Every morning | ORAL | Status: DC
  Administered 2015-07-07 – 2015-07-12 (×6): 1 mg via ORAL
  Filled 2015-07-06 (×6): qty 1

## 2015-07-06 MED ORDER — ONDANSETRON HCL 4 MG/2ML IJ SOLN: 4.0000 mg | Freq: Four times a day (QID) | INTRAMUSCULAR | Status: DC | PRN

## 2015-07-06 MED ORDER — DEXTROSE-NACL 5-0.45 % IV SOLN
INTRAVENOUS | Status: DC
Start: 2015-07-06 — End: 2015-07-06
  Administered 2015-07-06: 11:00:00 via INTRAVENOUS

## 2015-07-06 MED ORDER — CYCLOBENZAPRINE HCL 5 MG PO TABS: 5.0000 mg | ORAL_TABLET | Freq: Three times a day (TID) | ORAL | Status: DC | PRN

## 2015-07-06 MED ORDER — DEXTROSE-NACL 5-0.45 % IV SOLN
INTRAVENOUS | Status: DC
  Administered 2015-07-06: 1000 mL via INTRAVENOUS
  Administered 2015-07-07 – 2015-07-11 (×6): via INTRAVENOUS

## 2015-07-06 MED ORDER — KETOROLAC TROMETHAMINE 30 MG/ML IJ SOLN: 30.0000 mg | Freq: Once | INTRAMUSCULAR | Status: DC

## 2015-07-06 MED ORDER — SODIUM CHLORIDE 0.9 % IV SOLN
25.0000 mg | INTRAVENOUS | Status: DC | PRN
  Administered 2015-07-11 – 2015-07-12 (×2): 25 mg via INTRAVENOUS
  Filled 2015-07-06 (×7): qty 0.5

## 2015-07-06 MED ORDER — HYDROXYUREA 500 MG PO CAPS
1000.0000 mg | ORAL_CAPSULE | Freq: Every day | ORAL | Status: DC
  Administered 2015-07-07 – 2015-07-12 (×6): 1000 mg via ORAL
  Filled 2015-07-06 (×7): qty 2

## 2015-07-06 MED ORDER — SODIUM CHLORIDE 0.9% FLUSH: 9.0000 mL | INTRAVENOUS | Status: DC | PRN

## 2015-07-06 MED ORDER — GABAPENTIN 300 MG PO CAPS
300.0000 mg | ORAL_CAPSULE | Freq: Three times a day (TID) | ORAL | Status: DC
  Administered 2015-07-06 – 2015-07-12 (×18): 300 mg via ORAL
  Filled 2015-07-06 (×18): qty 1

## 2015-07-06 MED ORDER — DEXTROSE-NACL 5-0.45 % IV SOLN: INTRAVENOUS | Status: DC

## 2015-07-06 MED ORDER — ENOXAPARIN SODIUM 120 MG/0.8ML ~~LOC~~ SOLN
120.0000 mg | SUBCUTANEOUS | Status: DC
  Filled 2015-07-06 (×2): qty 0.8

## 2015-07-06 MED ORDER — ZOLPIDEM TARTRATE 10 MG PO TABS
10.0000 mg | ORAL_TABLET | Freq: Every evening | ORAL | Status: DC | PRN
  Administered 2015-07-06 – 2015-07-11 (×6): 10 mg via ORAL
  Filled 2015-07-06 (×6): qty 1

## 2015-07-06 MED ORDER — ASPIRIN 81 MG PO CHEW
81.0000 mg | CHEWABLE_TABLET | Freq: Every day | ORAL | Status: DC
  Administered 2015-07-07 – 2015-07-12 (×6): 81 mg via ORAL
  Filled 2015-07-06 (×6): qty 1

## 2015-07-06 MED ORDER — HYDROMORPHONE 1 MG/ML IV SOLN
INTRAVENOUS | Status: DC
  Administered 2015-07-06: 10.5 mg via INTRAVENOUS
  Administered 2015-07-06: 11:00:00 via INTRAVENOUS
  Administered 2015-07-06: 25 mg via INTRAVENOUS
  Administered 2015-07-07: 7.5 mg via INTRAVENOUS
  Administered 2015-07-07: 9.8 mg via INTRAVENOUS
  Administered 2015-07-07: 18:00:00 via INTRAVENOUS
  Administered 2015-07-07: 9.8 mg via INTRAVENOUS
  Administered 2015-07-07: 7.7 mg via INTRAVENOUS
  Administered 2015-07-07: 22.4 mg via INTRAVENOUS
  Administered 2015-07-07: 11.9 mg via INTRAVENOUS
  Administered 2015-07-08: 5.6 mg via INTRAVENOUS
  Administered 2015-07-08: 25 mg via INTRAVENOUS
  Administered 2015-07-08: 19:00:00 via INTRAVENOUS
  Administered 2015-07-08: 6.3 mg via INTRAVENOUS
  Administered 2015-07-08: 12.5 mg via INTRAVENOUS
  Administered 2015-07-08: 7 mg via INTRAVENOUS
  Administered 2015-07-08: 12.5 mg via INTRAVENOUS
  Administered 2015-07-09: 8.4 mg via INTRAVENOUS
  Administered 2015-07-09: 18:00:00 via INTRAVENOUS
  Administered 2015-07-09: 7 mg via INTRAVENOUS
  Administered 2015-07-09: 06:00:00 via INTRAVENOUS
  Administered 2015-07-09: 7.7 mg via INTRAVENOUS
  Administered 2015-07-09: 8.54 mg via INTRAVENOUS
  Administered 2015-07-09: 14 mg via INTRAVENOUS
  Administered 2015-07-10: 0.7 mg via INTRAVENOUS
  Filled 2015-07-06 (×8): qty 25

## 2015-07-06 MED ORDER — DIPHENHYDRAMINE HCL 25 MG PO CAPS
25.0000 mg | ORAL_CAPSULE | ORAL | Status: DC | PRN
  Administered 2015-07-06 – 2015-07-09 (×2): 50 mg via ORAL
  Filled 2015-07-06 (×2): qty 2

## 2015-07-06 NOTE — ED Notes (Signed)
Pt reports he called the sickle cell clinic prior to coming to the ED, was instructed to come to the ED d/t c/o a h/a.  Pt is A&Ox 4, ambulatory without difficulty.  No obvious neuro deficits noted at this time.

## 2015-07-06 NOTE — Progress Notes (Signed)
CRITICAL VALUE ALERT  Critical value received:  Hgb 6.5  Date of notification:  07/06/2015  Time of notification:  [id number]  Critical value read back:Yes.    Nurse who received alert:  B. Tamala Julian  MD notified (1st page):  L. Hollis  Time of first page:  3  MD notified (2nd page):  Time of second page:  Responding MD:  Lorel Monaco, NP  Time MD responded:  [phone]

## 2015-07-06 NOTE — Telephone Encounter (Signed)
Spoke with provider.  Advised patient since he has had the on-going headache without relief he needs to be evaluated in the ED.  Patient verbalizes understanding

## 2015-07-06 NOTE — ED Notes (Signed)
Pt with hx of sickle cell anemia c/o bilateral flank pain and headache, states this feels like typical sickle cell crisis. Some SOB on ambulation. No CP.

## 2015-07-06 NOTE — Progress Notes (Signed)
Nursing Note: Rapid response nurse at bedside and placed sensor on ear lobe and PO2 100%the patient awake and alert.She was able to hear air movement in all lung fields.Paged on-call and made aware.wbb

## 2015-07-06 NOTE — H&P (Signed)
[facility] History and Physical  [name] DOB: November 13, 1980 DOA: 07/06/2015  PCP: [name], MD   Chief Complaint: Pain and shortness of breath  HPI: [name] is a 35 y.o. male with a history of sickle cell anemia, HbSS presents with left flank pain that is consistent with his sickle cell pain crisis. Patient reports that pain intensity has been increasing over the past several days. He has not identified any palliative or provocative factors. Patient was evaluated in the emergency department, spoke with Dr. Deno Etienne, who states that patient is a candidate for the sickle cell day infusion center for pain management.Patient says that current pain intensity is 9/10 descried as constant and throbbing. He last had Dilaudid this morning around 6 am with minimal relief. He says that he has been taking all other medications consistently. He also reports that he has not been wearing home oxygen consistently. He denies headache, chest pain, dysuria, abdominal pain, nausea, vomiting, or diarrhea. Patient was recently admitted to [facility] for Pulmonary Embolism, however patient is on full dose anticoagulation.  Patient was managed at the day hospital according to sickle cell pain crisis protocol, however, he was noticed to have persistent low oxygenation and work of breathing went up a little with air-hunger. His saturation was consistently below 92% even on 4 L of oxygen. Patient will therefore be admitted to inpatient for further work up and management.    General: The patient denies anorexia, fever, weight loss Cardiac: Denies chest pain, syncope, palpitations, pedal edema  Respiratory: Denies cough GI: Denies severe indigestion/heartburn, abdominal pain, nausea, vomiting, diarrhea and constipation GU: Denies hematuria, incontinence, dysuria  Musculoskeletal: Denies arthritis  Skin: Denies suspicious skin lesions Neurologic: Denies focal weakness or  numbness, change in vision Psychiatry: Denies depression or anxiety. Hematologic: no easy bruising or bleeding  All other systems reviewed and found to be negative.  Past Medical History  Diagnosis Date  . Sickle cell anemia (HCC)   . Blood transfusion   . Acute embolism and thrombosis of right internal jugular vein ([place])   . Hypokalemia   . Mood disorder ([place])   . History of pulmonary embolus (PE)   . Avascular necrosis (HCC)     Right Hip  . Leukocytosis     Chronic  . Thrombocytosis (HCC)     Chronic  . Hypertension   . History of Clostridium difficile infection   . Uses marijuana   . Chronic anticoagulation   . Functional asplenia   . Former smoker   . Second hand tobacco smoke exposure   . Alcohol consumption of one to four drinks per day   . Noncompliance with medication regimen   . Sickle-cell crisis with associated acute chest syndrome ([place]) 05/13/2013  . Acute chest syndrome ([place]) 06/18/2013  . Demand ischemia ([place]) 01/02/2014  . Pulmonary hypertension ([place])   . Hb-SS disease with crisis ([place])   . Oxygen deficiency    Past Surgical History  Procedure Laterality Date  . Right hip replacement      08/2006  . Cholecystectomy      01/2008  . Porta cath placement    . Porta cath removal    . Umbilical hernia repair      01/2008  . Excision of left periauricular cyst      10/2009  . Excision of right ear lobe cyst with primary closur      11/2007  . Portacath placement  01/05/2012    Procedure:  INSERTION PORT-A-CATH;  Surgeon: Odis Hollingshead, MD;  Location: [place];  Service: General;  Laterality: N/A;  ultrasound guiced port a cath insertion with fluoroscopy    Prior to Admission medications   Medication Sig Start Date End Date Taking? Authorizing Provider  acetaminophen (TYLENOL) 325 MG tablet Take 650 mg by mouth every 6 (six) hours as needed for moderate pain.   Yes Historical Provider, MD  aspirin 81 MG chewable tablet Chew 1 tablet (81 mg total) by mouth  daily. 07/23/14  Yes [name], MD  Cholecalciferol (VITAMIN D) 2000 units tablet Take 1 tablet (2,000 Units total) by mouth daily. 02/23/15  Yes Dorena Dew, FNP  folic acid (FOLVITE) 1 MG tablet Take 1 tablet (1 mg total) by mouth every morning. 02/23/15  Yes Dorena Dew, FNP  gabapentin (NEURONTIN) 300 MG capsule Take 1 capsule (300 mg total) by mouth 3 (three) times daily. 06/01/15  Yes Tresa Garter, MD  HYDROmorphone (DILAUDID) 4 MG tablet Take 1 tablet (4 mg total) by mouth every 4 (four) hours as needed for severe pain. 06/01/15  Yes Tresa Garter, MD  hydroxyurea (HYDREA) 500 MG capsule Take 2 capsules (1,000 mg total) by mouth daily. May take with food to minimize GI side effects. Patient taking differently: Take 1,500 mg by mouth daily. May take with food to minimize GI side effects. 06/01/15  Yes Tresa Garter, MD  metoprolol succinate (TOPROL-XL) 25 MG 24 hr tablet TK 1 T PO ONCE D 07/03/15  Yes Historical Provider, MD  morphine (MS CONTIN) 30 MG 12 hr tablet Take 1 tablet (30 mg total) by mouth every 12 (twelve) hours. 06/01/15  Yes Tresa Garter, MD  potassium chloride SA (K-DUR,KLOR-CON) 20 MEQ tablet Take 1 tablet (20 mEq total) by mouth every morning. Patient taking differently: Take 20 mEq by mouth daily.  04/13/15  Yes Dorena Dew, FNP  Rivaroxaban (XARELTO) 15 MG TABS tablet Take 15 mg by mouth daily.  07/03/15  Yes Historical Provider, MD  zolpidem (AMBIEN) 10 MG tablet Take 1 tablet (10 mg total) by mouth at bedtime as needed for sleep. 06/01/15  Yes Tresa Garter, MD   Physical Exam: Filed Vitals:   07/06/15 1412 07/06/15 1413 07/06/15 1501 07/06/15 1600  BP: 120/87  114/81   Pulse: 97  99 100  Temp:      TempSrc:      Resp:   18 20  SpO2: 88% 91% 90% 88%   General: Chronically ill-looking, mod respiratory and pain distress, saturating 88% on 4L of oxygen, afebrile, pale++, icteric+ HEENT: Normocephalic and Atraumatic, Mucous  membranes pink                PERRLA; EOM intact; No scleral icterus,                 Nares: Patent, Oropharynx: Clear, Fair Dentition                 Neck: FROM, no cervical lymphadenopathy, thyromegaly, carotid bruit or JVD;  Breasts: deferred CHEST WALL: No tenderness  CHEST: Normal respiration, clear to auscultation bilaterally  HEART: Regular rate and rhythm; no murmurs rubs or gallops  BACK: No kyphosis or scoliosis; no CVA tenderness  GI: Positive Bowel Sounds, soft, non-tender; no masses, no organomegaly Rectal Exam: deferred MSK: No cyanosis, clubbing, or edema Genitalia: not examined  SKIN:  no rash or ulceration  CNS: Alert and Oriented x 4, Nonfocal exam, CN 2-12  intact  Labs on Admission:  Basic Metabolic Panel:  Recent Labs Lab 07/06/15 1103  NA 137  K 3.9  CL 109  CO2 19*  GLUCOSE 123*  BUN 19  CREATININE 0.96  CALCIUM 9.3   Liver Function Tests:  Recent Labs Lab 07/06/15 1103  AST 35  ALT 23  ALKPHOS 68  BILITOT 3.8*  PROT 8.3*  ALBUMIN 4.3   No results for input(s): LIPASE, AMYLASE in the last 168 hours. No results for input(s): AMMONIA in the last 168 hours. CBC:  Recent Labs Lab 07/06/15 1103  WBC 11.8*  NEUTROABS 9.0*  HGB 6.5*  HCT 18.2*  MCV 92.9  PLT 550*   Cardiac Enzymes: No results for input(s): CKTOTAL, CKMB, CKMBINDEX, TROPONINI in the last 168 hours.  BNP (last 3 results)  Recent Labs  08/29/14 0810 09/10/14 0612 11/30/14 0910  BNP 1117.0* 549.5* 639.7*    ProBNP (last 3 results) No results for input(s): PROBNP in the last 8760 hours.  CBG: No results for input(s): GLUCAP in the last 168 hours.  Radiological Exams on Admission: Dg Chest 2 View  07/06/2015  CLINICAL DATA:  Sickle cell crisis with chest pain and shortness of breath today. EXAM: CHEST  2 VIEW COMPARISON:  Chest x-ray dated 06/19/2015. FINDINGS: Cardiomegaly is stable. Left chest wall Port-A-Cath is stable in position with tip overlying the expected  location of the cavoatrial junction. Pulmonary vasculature remains mildly prominent, without frank pulmonary edema. Suspect some degree of chronic pulmonary artery hypertension. No evidence of pneumonia. No pleural effusion or pneumothorax seen. Osseous structures about the chest are unremarkable. IMPRESSION: Stable cardiomegaly.  No acute findings. Electronically Signed   By: Franki Cabot M.D.   On: 07/06/2015 12:50   EKG: Independently reviewed.   Assessment/Plan Active Problems:   Sickle cell pain crisis ([place])   Hypoxia   Pulmonary embolism without acute cor pulmonale (HCC)  - Admit Inpatient - MedSurg - IVF D5.45 Saline @ 100 cc/Hr - IV Dilaudid PCA weight-based  - IV Toradol 30 mg Q 6 H - Oxygen by [place] to keep saturation above 92% - Restart home medications - History of hypertension on metoprolol and lisinopril - Labs and CXR in am  - Consulted: None  Code Status: Full Code Family Communication: N/A DVT Prophylaxis: Full dose of Lovenox  Time spent: 55 minutes  Alferd Obryant, MD [facility]  If 7PM-7AM, please contact night-coverage [email or web address] 07/06/2015, 5:15 PM

## 2015-07-06 NOTE — Discharge Instructions (Signed)
Sickle Cell Anemia, Adult Sickle cell anemia is a condition in which red blood cells have an abnormal "sickle" shape. This abnormal shape shortens the cells' life span, which results in a lower than normal concentration of red blood cells in the blood. The sickle shape also causes the cells to clump together and block free blood flow through the blood vessels. As a result, the tissues and organs of the body do not receive enough oxygen. Sickle cell anemia causes organ damage and pain and increases the risk of infection. CAUSES  Sickle cell anemia is a genetic disorder. Those who receive two copies of the gene have the condition, and those who receive one copy have the trait. RISK FACTORS The sickle cell gene is most common in people whose families originated in [place]. Other areas of the globe where sickle cell trait occurs include the [place], [place] and [place], the [place], and the [place].  SIGNS AND SYMPTOMS  Pain, especially in the extremities, back, chest, or abdomen (common). The pain may start suddenly or may develop following an illness, especially if there is dehydration. Pain can also occur due to overexertion or exposure to extreme temperature changes.  Frequent severe bacterial infections, especially certain types of pneumonia and meningitis.  Pain and swelling in the hands and feet.  Decreased activity.   Loss of appetite.   Change in behavior.  Headaches.  Seizures.  Shortness of breath or difficulty breathing.  Vision changes.  Skin ulcers. Those with the trait may not have symptoms or they may have mild symptoms.  DIAGNOSIS  Sickle cell anemia is diagnosed with blood tests that demonstrate the genetic trait. It is often diagnosed during the newborn period, due to mandatory testing nationwide. A variety of blood tests, X-rays, CT scans, MRI scans, ultrasounds, and lung function tests may also be done to monitor the condition. TREATMENT  Sickle  cell anemia may be treated with:  Medicines. You may be given pain medicines, antibiotic medicines (to treat and prevent infections) or medicines to increase the production of certain types of hemoglobin.  Fluids.  Oxygen.  Blood transfusions. HOME CARE INSTRUCTIONS   Drink enough fluid to keep your urine clear or pale yellow. Increase your fluid intake in hot weather and during exercise.  Do not smoke. Smoking lowers oxygen levels in the blood.   Only take over-the-counter or prescription medicines for pain, fever, or discomfort as directed by your health care provider.  Take antibiotics as directed by your health care provider. Make sure you finish them it even if you start to feel better.   Take supplements as directed by your health care provider.   Consider wearing a medical alert bracelet. This tells anyone caring for you in an emergency of your condition.   When traveling, keep your medical information, health care provider's names, and the medicines you take with you at all times.   If you develop a fever, do not take medicines to reduce the fever right away. This could cover up a problem that is developing. Notify your health care provider.  Keep all follow-up appointments with your health care provider. Sickle cell anemia requires regular medical care. SEEK MEDICAL CARE IF: You have a fever. SEEK IMMEDIATE MEDICAL CARE IF:   You feel dizzy or faint.   You have new abdominal pain, especially on the left side near the stomach area.   You develop a persistent, often uncomfortable and painful penile erection (priapism). If this is not treated immediately it   will lead to impotence.   You have numbness your arms or legs or you have a hard time moving them.   You have a hard time with speech.   You have a fever or persistent symptoms for more than 2-3 days.   You have a fever and your symptoms suddenly get worse.   You have signs or symptoms of infection.  These include:   Chills.   Abnormal tiredness (lethargy).   Irritability.   Poor eating.   Vomiting.   You develop pain that is not helped with medicine.   You develop shortness of breath.  You have pain in your chest.   You are coughing up pus-like or bloody sputum.   You develop a stiff neck.  Your feet or hands swell or have pain.  Your abdomen appears bloated.  You develop joint pain. MAKE SURE YOU:  Understand these instructions.   This information is not intended to replace advice given to you by your health care provider. Make sure you discuss any questions you have with your health care provider.   Document Released: 04/27/2005 Document Revised: 02/07/2014 Document Reviewed: 08/29/2012 Elsevier Interactive Patient Education 2016 [employer]  

## 2015-07-06 NOTE — Progress Notes (Signed)
Patient ID: [name], male   DOB: 02-28-1980, 35 y.o.   MRN: [id number] IN [facility] today for treatment of acute pain. Pain in left lateral rib cage and c/o headache. Pain intensity of 8/10. Patient walked over from the ED. Did not have his home oxygen, stating "it is in the trunk of the car". Treated with IV fluids, Dilaudid PCA. Oxygen saturations remained below 92% with oxygen flow starting at 2l/ [place] and increasing to as high as 4l/[place]. Lorel Monaco NP aware of low saturations and Dr. Doreene Burke notified and decision to admit was made. Patient aware. Called report to [facility] for room 1329 inpatient unit. Patient transported to room in wheelchair and on oxygen at 4l/[place]. Has clothing and cell phone. Alert, oriented and ambulatory to wheelchair. Transported with PCA pump and IV on pump.

## 2015-07-06 NOTE — Progress Notes (Signed)
Nursing Note: On shift change ,pt's pca alarming  as O2 sats are ranging form 77-88 % on 5L n/c.Pt awake,little sleepy but talking and used urinal.Denies SOB.Lung sounds are diminshed on entire L side.L lower more diminshed.T-99.6 p-104 r-21 BP-117/75. CO-21.PO2 84%.Paged on -call and paged MD on-call.wbb

## 2015-07-06 NOTE — Telephone Encounter (Signed)
Dr. Doreene Burke patient is requesting a refill on ambien, ms contin, and dilaudid. LOV 06/09/2015. Please advise. Thanks!

## 2015-07-06 NOTE — Telephone Encounter (Signed)
Patient C/O head ache since yesterday.  Patient also C/O of rib pain that is 7-8/10 on pain scale.  Patient states there has been no improvement with his home medications.  Patient denies chest pain, shortness of breath, N/V/D, or priapism.   / Patient also advises that he was discharged from [facility] on Thursday. / I advised I would notify the provider and give him a call back.  Patient verbalizes understanding.

## 2015-07-06 NOTE — ED Provider Notes (Signed)
CSN: [id number]     Arrival date & time 07/06/15  [id number] History   First MD Initiated Contact with Patient 07/06/15 [phone]     Chief Complaint  Patient presents with  . Flank Pain     (Consider location/radiation/quality/duration/timing/severity/associated sxs/prior Treatment) Patient is a 35 y.o. male presenting with sickle cell pain. The history is provided by the patient.  Sickle Cell Pain Crisis Location:  Back Severity:  Severe Onset quality:  Gradual Duration:  1 day Similar to previous crisis episodes: yes   Timing:  Constant Progression:  Worsening Chronicity:  Chronic History of pulmonary emboli: yes   Context: dehydration   Relieved by:  Nothing Worsened by:  Nothing tried Ineffective treatments:  None tried Associated symptoms: no chest pain, no congestion, no fever, no headaches, no shortness of breath and no vomiting    35 yo M With a chief complaint of sickle cell pain crisis. Patient states it started yesterday. Was preceded by a headache going on for the past couple days. He called the sickle cell clinic and they suggested he come to the emergency department with his headache. Otherwise he feels that his pain is typical of this sickle cell pain crisis. He denies any upper or lower externally weakness denies change in speech denies confusion.   Past Medical History  Diagnosis Date  . Sickle cell anemia (HCC)   . Blood transfusion   . Acute embolism and thrombosis of right internal jugular vein ([place])   . Hypokalemia   . Mood disorder ([place])   . History of pulmonary embolus (PE)   . Avascular necrosis (HCC)     Right Hip  . Leukocytosis     Chronic  . Thrombocytosis (HCC)     Chronic  . Hypertension   . History of Clostridium difficile infection   . Uses marijuana   . Chronic anticoagulation   . Functional asplenia   . Former smoker   . Second hand tobacco smoke exposure   . Alcohol consumption of one to four drinks per day   . Noncompliance with medication  regimen   . Sickle-cell crisis with associated acute chest syndrome ([name]) 05/13/2013  . Acute chest syndrome ([place]) 06/18/2013  . Demand ischemia ([place]) 01/02/2014  . Pulmonary hypertension ([place])   . Hb-SS disease with crisis ([place])   . Oxygen deficiency    Past Surgical History  Procedure Laterality Date  . Right hip replacement      08/2006  . Cholecystectomy      01/2008  . Porta cath placement    . Porta cath removal    . Umbilical hernia repair      01/2008  . Excision of left periauricular cyst      10/2009  . Excision of right ear lobe cyst with primary closur      11/2007  . Portacath placement  01/05/2012    Procedure: INSERTION PORT-A-CATH;  Surgeon: Odis Hollingshead, MD;  Location: [place];  Service: General;  Laterality: N/A;  ultrasound guiced port a cath insertion with fluoroscopy   Family History  Problem Relation Age of Onset  . Sickle cell trait Mother   . Depression Mother   . Diabetes Mother   . Sickle cell trait Father   . Sickle cell trait Brother    Social History  Substance Use Topics  . Smoking status: Former Smoker -- 0.50 packs/day for 10 years    Types: Cigarettes    Quit date: 05/29/2011  . Smokeless tobacco:  Never Used  . Alcohol Use: No    Review of Systems  Constitutional: Negative for fever and chills.  HENT: Negative for congestion and facial swelling.   Eyes: Negative for discharge and visual disturbance.  Respiratory: Negative for shortness of breath.   Cardiovascular: Negative for chest pain and palpitations.  Gastrointestinal: Negative for vomiting, abdominal pain and diarrhea.  Musculoskeletal: Negative for myalgias and arthralgias.  Skin: Negative for color change and rash.  Neurological: Negative for tremors, syncope and headaches.  Psychiatric/Behavioral: Negative for confusion and dysphoric mood.      Allergies  Review of patient's allergies indicates no known allergies.  Home Medications   Prior to Admission medications    Medication Sig Start Date End Date Taking? Authorizing Provider  aspirin 81 MG chewable tablet Chew 1 tablet (81 mg total) by mouth daily. 07/23/14   [name], MD  Cholecalciferol (VITAMIN D) 2000 units tablet Take 1 tablet (2,000 Units total) by mouth daily. 02/23/15   Dorena Dew, FNP  cyclobenzaprine (FLEXERIL) 5 MG tablet Take 1 tablet (5 mg total) by mouth 3 (three) times daily as needed for muscle spasms. 02/19/15   Alfonzo Beers, MD  enoxaparin (LOVENOX) 120 MG/0.8ML injection Inject 0.8 mLs (120 mg total) into the skin daily. 12/03/14   [name], MD  folic acid (FOLVITE) 1 MG tablet Take 1 tablet (1 mg total) by mouth every morning. 02/23/15   Dorena Dew, FNP  gabapentin (NEURONTIN) 300 MG capsule Take 1 capsule (300 mg total) by mouth 3 (three) times daily. 06/01/15   Tresa Garter, MD  HYDROmorphone (DILAUDID) 4 MG tablet Take 1 tablet (4 mg total) by mouth every 4 (four) hours as needed for severe pain. 06/01/15   Tresa Garter, MD  hydroxyurea (HYDREA) 500 MG capsule Take 2 capsules (1,000 mg total) by mouth daily. May take with food to minimize GI side effects. 06/01/15   Tresa Garter, MD  lisinopril (PRINIVIL,ZESTRIL) 10 MG tablet Take 1 tablet (10 mg total) by mouth daily. 04/13/15   Dorena Dew, FNP  metoprolol succinate (TOPROL-XL) 50 MG 24 hr tablet Take 1 tablet (50 mg total) by mouth daily. 02/23/15   Dorena Dew, FNP  morphine (MS CONTIN) 30 MG 12 hr tablet Take 1 tablet (30 mg total) by mouth every 12 (twelve) hours. 06/01/15   Tresa Garter, MD  potassium chloride SA (K-DUR,KLOR-CON) 20 MEQ tablet Take 1 tablet (20 mEq total) by mouth every morning. Patient taking differently: Take 20 mEq by mouth daily.  04/13/15   Dorena Dew, FNP  zolpidem (AMBIEN) 10 MG tablet Take 1 tablet (10 mg total) by mouth at bedtime as needed for sleep. 06/01/15   Tresa Garter, MD   BP 120/83 mmHg  Pulse 112  Temp(Src) 97.8 F (36.6  C) (Oral)  Resp 18  SpO2 95% Physical Exam  Constitutional: He is oriented to person, place, and time. He appears well-developed and well-nourished.  HENT:  Head: Normocephalic and atraumatic.  Eyes: EOM are normal. Pupils are equal, round, and reactive to light.  Neck: Normal range of motion. Neck supple. No JVD present.  Cardiovascular: Normal rate and regular rhythm.  Exam reveals no gallop and no friction rub.   No murmur heard. Pulmonary/Chest: No respiratory distress. He has no wheezes.  Abdominal: He exhibits no distension. There is no rebound and no guarding.  Musculoskeletal: Normal range of motion.  Neurological: He is alert and oriented to person,  place, and time. He has normal strength. No sensory deficit. Coordination and gait normal. GCS eye subscore is 4. GCS verbal subscore is 5. GCS motor subscore is 6.  Skin: No rash noted. No pallor.  Psychiatric: He has a normal mood and affect. His behavior is normal.  Nursing note and vitals reviewed.   ED Course  Procedures (including critical care time) Labs Review Labs Reviewed - No data to display  Imaging Review No results found. I have personally reviewed and evaluated these images and lab results as part of my medical decision-making.    EKG Interpretation None      MDM   Final diagnoses:  Sickle cell anemia with crisis [facility])    35 yo M with a chief complaint of sickle cell pain crisis. I discussed with the sickle cell clinic. I feel that he is not having an acute stroke at this time. He'll go to the clinic for pain management.  10:30 AM:  I have discussed the diagnosis/risks/treatment options with the patient and believe the pt to be eligible for discharge home to follow-up with Sickle cell pain clinic. We also discussed returning to the ED immediately if new or worsening sx occur. We discussed the sx which are most concerning (e.g., sudden worsening pain, fever, inability to tolerate by mouth, stroke like  symptoms) that necessitate immediate return. Medications administered to the patient during their visit and any new prescriptions provided to the patient are listed below.  Medications given during this visit Medications - No data to display  New Prescriptions   No medications on file    The patient appears reasonably screen and/or stabilized for discharge and I doubt any other medical condition or other [facility] requiring further screening, evaluation, or treatment in the ED at this time prior to discharge.     Deno Etienne, DO 07/06/15 1030

## 2015-07-06 NOTE — H&P (Signed)
[facility] History and Physical   Date: 07/06/2015  Patient name: [name] Medical record number: [id number] Date of birth: 06/11/1980 Age: 35 y.o. Gender: male PCP: [name], MD  Attending physician: Tresa Garter, MD  Chief Complaint: Left flank pain  History of Present Illness: [name], 35 year old male with a history of sickle cell anemia, HbSS presents with left flank pain that is consistent with sickle cell anemia. Patient reports that pain intensity has been increasing over the past several days.He has not identified any palliative or provocative factors. Patient was evaluated in the emergency department, spoke with Dr. Deno Etienne, who states that patient is a candidate for the sickle cell day infusion center for pain management.  He says that current pain intensity is 9/10 descried as constant and throbbing. He last had Dilaudid this morning around 6 am with minimal relief. He says that he has been taking all other medications consistently. He also reports that he has not been wearing home oxygen consistently. He denies headache, chest pain, dysuria, abdominal pain, nausea, vomiting, or diarrhea.   Meds: Prescriptions prior to admission  Medication Sig Dispense Refill Last Dose  . aspirin 81 MG chewable tablet Chew 1 tablet (81 mg total) by mouth daily. 30 tablet 11 06/18/2015 at Unknown time  . Cholecalciferol (VITAMIN D) 2000 units tablet Take 1 tablet (2,000 Units total) by mouth daily. 90 tablet 3 06/18/2015 at Unknown time  . cyclobenzaprine (FLEXERIL) 5 MG tablet Take 1 tablet (5 mg total) by mouth 3 (three) times daily as needed for muscle spasms. 20 tablet 0 06/18/2015 at Unknown time  . enoxaparin (LOVENOX) 120 MG/0.8ML injection Inject 0.8 mLs (120 mg total) into the skin daily. 30 Syringe 2 06/18/2015 at Unknown time  . folic acid (FOLVITE) 1 MG tablet Take 1 tablet (1 mg total) by mouth every morning. 30 tablet 11 06/18/2015  at Unknown time  . gabapentin (NEURONTIN) 300 MG capsule Take 1 capsule (300 mg total) by mouth 3 (three) times daily. 270 capsule 3 06/19/2015 at Unknown time  . HYDROmorphone (DILAUDID) 4 MG tablet Take 1 tablet (4 mg total) by mouth every 4 (four) hours as needed for severe pain. 90 tablet 0 06/19/2015 at Unknown time  . hydroxyurea (HYDREA) 500 MG capsule Take 2 capsules (1,000 mg total) by mouth daily. May take with food to minimize GI side effects. 60 capsule 5 06/18/2015 at Unknown time  . lisinopril (PRINIVIL,ZESTRIL) 10 MG tablet Take 1 tablet (10 mg total) by mouth daily. 30 tablet 5 06/18/2015 at Unknown time  . metoprolol succinate (TOPROL-XL) 50 MG 24 hr tablet Take 1 tablet (50 mg total) by mouth daily. 30 tablet 3 06/18/2015 at Unknown time  . morphine (MS CONTIN) 30 MG 12 hr tablet Take 1 tablet (30 mg total) by mouth every 12 (twelve) hours. 60 tablet 0 06/19/2015 at Unknown time  . potassium chloride SA (K-DUR,KLOR-CON) 20 MEQ tablet Take 1 tablet (20 mEq total) by mouth every morning. (Patient taking differently: Take 20 mEq by mouth daily. ) 30 tablet 3 06/18/2015 at Unknown time  . zolpidem (AMBIEN) 10 MG tablet Take 1 tablet (10 mg total) by mouth at bedtime as needed for sleep. 30 tablet 0 06/18/2015 at Unknown time    Allergies: Review of patient's allergies indicates no known allergies. Past Medical History  Diagnosis Date  . Sickle cell anemia (HCC)   . Blood transfusion   . Acute embolism and  thrombosis of right internal jugular vein ([place])   . Hypokalemia   . Mood disorder ([place])   . History of pulmonary embolus (PE)   . Avascular necrosis (HCC)     Right Hip  . Leukocytosis     Chronic  . Thrombocytosis (HCC)     Chronic  . Hypertension   . History of Clostridium difficile infection   . Uses marijuana   . Chronic anticoagulation   . Functional asplenia   . Former smoker   . Second hand tobacco smoke exposure   . Alcohol consumption of one to four drinks per day    . Noncompliance with medication regimen   . Sickle-cell crisis with associated acute chest syndrome ([place]) 05/13/2013  . Acute chest syndrome ([place]) 06/18/2013  . Demand ischemia ([place]) 01/02/2014  . Pulmonary hypertension ([place])   . Hb-SS disease with crisis ([place])   . Oxygen deficiency    Past Surgical History  Procedure Laterality Date  . Right hip replacement      08/2006  . Cholecystectomy      01/2008  . Porta cath placement    . Porta cath removal    . Umbilical hernia repair      01/2008  . Excision of left periauricular cyst      10/2009  . Excision of right ear lobe cyst with primary closur      11/2007  . Portacath placement  01/05/2012    Procedure: INSERTION PORT-A-CATH;  Surgeon: Odis Hollingshead, MD;  Location: [place];  Service: General;  Laterality: N/A;  ultrasound guiced port a cath insertion with fluoroscopy   Family History  Problem Relation Age of Onset  . Sickle cell trait Mother   . Depression Mother   . Diabetes Mother   . Sickle cell trait Father   . Sickle cell trait Brother    Social History   Social History  . Marital Status: Single    Spouse Name: N/A  . Number of Children: 0  . Years of Education: 13   Occupational History  . 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 Smoking status: Former Smoker -- 0.50 packs/day for 10 years    Types: Cigarettes    Quit date: 05/29/2011  . Smokeless tobacco: Never Used  . Alcohol Use: No  . Drug Use: No     Comment: Marijuana weekly  . Sexual Activity:    Partners: Female     Comment: month ago   Other Topics Concern  . Not on file   Social History Narrative   Lives in an apartment.  Single.  Lives alone but has a girlfriend that helps care for him.  Does not use any assist devices.        [name]  [phone] Mom, emergency contact      [facility]:   Patient continuing to live in her apartment in town alone. Works as a  Art gallery manager. Does have a dog.    Review of Systems: Constitutional: positive for fatigue Eyes: positive for icterus Ears, nose, mouth, throat, and face: negative Respiratory: positive for dyspnea on exertion Cardiovascular: positive for dyspnea Gastrointestinal: negative for abdominal pain, diarrhea, dysphagia, nausea, reflux symptoms and vomiting Genitourinary:negative Integument/breast: negative Hematologic/lymphatic: negative Musculoskeletal:positive for myalgias Neurological: negative Behavioral/Psych: negative Endocrine: negative Allergic/Immunologic: negative  Physical Exam:  BP 114/81 mmHg  Pulse 99  Temp(Src) 98.5 F (36.9 C) (Oral)  Resp  18  SpO2 90%  General Appearance:    Alert, cooperative, mild distress, appears stated age  Head:    Normocephalic, without obvious abnormality, atraumatic  Eyes:    PERRL, conjunctiva/corneas clear, EOM's intact, fundi    benign, scleral icterus       Ears:    Normal TM's and external ear canals, both ears  Nose:   Nares normal, septum midline, mucosa normal, no drainage    or sinus tenderness  Throat:   Lips, mucosa, and tongue normal; teeth and gums normal  Neck:   Supple, symmetrical, trachea midline, no adenopathy;       thyroid:  No enlargement/tenderness/nodules; no carotid   bruit or JVD  Back:     Symmetric, no curvature, ROM normal, no CVA tenderness  Lungs:     Clear to auscultation bilaterally, respirations labored  Chest wall:    No tenderness or deformity  Heart:    Irregular rate and rhythm, S1 and S2 normal, no murmur, rub   or gallop  Abdomen:     Soft, non-tender, bowel sounds active all four quadrants,    no masses, no organomegaly  Extremities:   Extremities normal, atraumatic, no cyanosis or edema  Pulses:   2+ and symmetric all extremities  Skin:   Skin color, texture, turgor normal, no rashes or lesions  Lymph nodes:   Cervical, supraclavicular, and axillary nodes normal  Neurologic:   CNII-XII intact. Normal  strength, sensation and reflexes      throughout    Lab results: Results for orders placed or performed during the hospital encounter of 07/06/15 (from the past 24 hour(s))  CBC with Differential/Platelet     Status: Abnormal   Collection Time: 07/06/15 11:03 AM  Result Value Ref Range   WBC 11.8 (H) 4.0 - 10.5 K/uL   RBC 1.96 (L) 4.22 - 5.81 MIL/uL   Hemoglobin 6.5 (LL) 13.0 - 17.0 g/dL   HCT 18.2 (L) 39.0 - 52.0 %   MCV 92.9 78.0 - 100.0 fL   MCH 33.2 26.0 - 34.0 pg   MCHC 35.7 30.0 - 36.0 g/dL   RDW 22.5 (H) 11.5 - 15.5 %   Platelets 550 (H) 150 - 400 K/uL   Neutrophils Relative % 76 %   Lymphocytes Relative 13 %   Monocytes Relative 10 %   Eosinophils Relative 1 %   Basophils Relative 0 %   Neutro Abs 9.0 (H) 1.7 - 7.7 K/uL   Lymphs Abs 1.5 0.7 - 4.0 K/uL   Monocytes Absolute 1.2 (H) 0.1 - 1.0 K/uL   Eosinophils Absolute 0.1 0.0 - 0.7 K/uL   Basophils Absolute 0.0 0.0 - 0.1 K/uL   RBC Morphology SICKLE CELLS   Comprehensive metabolic panel     Status: Abnormal   Collection Time: 07/06/15 11:03 AM  Result Value Ref Range   Sodium 137 135 - 145 mmol/L   Potassium 3.9 3.5 - 5.1 mmol/L   Chloride 109 101 - 111 mmol/L   CO2 19 (L) 22 - 32 mmol/L   Glucose, Bld 123 (H) 65 - 99 mg/dL   BUN 19 6 - 20 mg/dL   Creatinine, Ser 0.96 0.61 - 1.24 mg/dL   Calcium 9.3 8.9 - 10.3 mg/dL   Total Protein 8.3 (H) 6.5 - 8.1 g/dL   Albumin 4.3 3.5 - 5.0 g/dL   AST 35 15 - 41 U/L   ALT 23 17 - 63 U/L   Alkaline Phosphatase 68 38 - 126 U/L  Total Bilirubin 3.8 (H) 0.3 - 1.2 mg/dL   GFR calc non Af Amer >60 >60 mL/min   GFR calc Af Amer >60 >60 mL/min   Anion gap 9 5 - 15  Reticulocytes     Status: Abnormal   Collection Time: 07/06/15 11:03 AM  Result Value Ref Range   Retic Ct Pct 17.0 (H) 0.4 - 3.1 %   RBC. 1.96 (L) 4.22 - 5.81 MIL/uL   Retic Count, Manual 333.2 (H) 19.0 - 186.0 K/uL   *Note: Due to a large number of results and/or encounters for the requested time period, some  results have not been displayed. A complete set of results can be found in Results Review.    Imaging results:  No results found.   Assessment & Plan:  Patient will be admitted to the day infusion center for extended observation  Start IV D5.45 for cellular rehydration at 75 ml/hr  Start Dilaudid PCA High Concentration per weight based protocol.   Patient will be re-evaluated for pain intensity in the context of function and relationship to baseline as care progresses.  If no significant pain relief, will transfer patient to inpatient services for a higher level of care.   Will check CMP, reticulocytes and CBC w/differential   Yuliza Cara M 07/06/2015, 11:01 AM

## 2015-07-06 NOTE — Progress Notes (Addendum)
Pt oxygen saturation at 88% on 4L; MD notified; no new orders received; incentive spirometry encouraged; will continue to monitor

## 2015-07-06 NOTE — Telephone Encounter (Signed)
Pt is requesting a refill for his Ambien-10mg , MS Contin-30mg , and Dilaudid-4mg . Thanks!

## 2015-07-07 ENCOUNTER — Other Ambulatory Visit: Payer: Self-pay | Admitting: Internal Medicine

## 2015-07-07 ENCOUNTER — Inpatient Hospital Stay (HOSPITAL_COMMUNITY): Payer: Medicare Other

## 2015-07-07 DIAGNOSIS — D5700 Hb-SS disease with crisis, unspecified: Secondary | ICD-10-CM

## 2015-07-07 DIAGNOSIS — Z96641 Presence of right artificial hip joint: Secondary | ICD-10-CM | POA: Diagnosis not present

## 2015-07-07 DIAGNOSIS — D638 Anemia in other chronic diseases classified elsewhere: Secondary | ICD-10-CM | POA: Diagnosis not present

## 2015-07-07 DIAGNOSIS — I2699 Other pulmonary embolism without acute cor pulmonale: Secondary | ICD-10-CM | POA: Diagnosis not present

## 2015-07-07 DIAGNOSIS — R0902 Hypoxemia: Secondary | ICD-10-CM | POA: Diagnosis not present

## 2015-07-07 DIAGNOSIS — J9621 Acute and chronic respiratory failure with hypoxia: Secondary | ICD-10-CM | POA: Diagnosis not present

## 2015-07-07 DIAGNOSIS — I2782 Chronic pulmonary embolism: Secondary | ICD-10-CM | POA: Diagnosis not present

## 2015-07-07 DIAGNOSIS — R0602 Shortness of breath: Secondary | ICD-10-CM | POA: Diagnosis not present

## 2015-07-07 DIAGNOSIS — G894 Chronic pain syndrome: Secondary | ICD-10-CM

## 2015-07-07 DIAGNOSIS — E46 Unspecified protein-calorie malnutrition: Secondary | ICD-10-CM | POA: Diagnosis not present

## 2015-07-07 LAB — CBC WITH DIFFERENTIAL/PLATELET
Basophils Absolute: 0.2 10*3/uL — ABNORMAL HIGH (ref 0.0–0.1)
Basophils Relative: 1 %
EOS PCT: 1 %
Eosinophils Absolute: 0.2 10*3/uL (ref 0.0–0.7)
HEMATOCRIT: 17.4 % — AB (ref 39.0–52.0)
Hemoglobin: 6.2 g/dL — CL (ref 13.0–17.0)
LYMPHS ABS: 2.3 10*3/uL (ref 0.7–4.0)
Lymphocytes Relative: 12 %
MCH: 33.7 pg (ref 26.0–34.0)
MCHC: 35.6 g/dL (ref 30.0–36.0)
MCV: 94.6 fL (ref 78.0–100.0)
Monocytes Absolute: 2.3 10*3/uL — ABNORMAL HIGH (ref 0.1–1.0)
Monocytes Relative: 12 %
NEUTROS ABS: 14.1 10*3/uL — AB (ref 1.7–7.7)
NRBC: 1 /100{WBCs} — AB
Neutrophils Relative %: 74 %
Platelets: 537 10*3/uL — ABNORMAL HIGH (ref 150–400)
RBC: 1.84 MIL/uL — AB (ref 4.22–5.81)
RDW: 21.2 % — AB (ref 11.5–15.5)
WBC: 19.1 10*3/uL — AB (ref 4.0–10.5)

## 2015-07-07 LAB — COMPREHENSIVE METABOLIC PANEL
ALT: 19 U/L (ref 17–63)
AST: 27 U/L (ref 15–41)
Albumin: 4.2 g/dL (ref 3.5–5.0)
Alkaline Phosphatase: 63 U/L (ref 38–126)
Anion gap: 7 (ref 5–15)
BILIRUBIN TOTAL: 3.6 mg/dL — AB (ref 0.3–1.2)
BUN: 12 mg/dL (ref 6–20)
CHLORIDE: 107 mmol/L (ref 101–111)
CO2: 22 mmol/L (ref 22–32)
Calcium: 9 mg/dL (ref 8.9–10.3)
Creatinine, Ser: 0.78 mg/dL (ref 0.61–1.24)
GFR calc Af Amer: >60 mL/min (ref >60)
GLUCOSE: 121 mg/dL — AB (ref 65–99)
Potassium: 4.1 mmol/L (ref 3.5–5.1)
Sodium: 136 mmol/L (ref 135–145)
Total Protein: 7.9 g/dL (ref 6.5–8.1)

## 2015-07-07 LAB — MRSA PCR SCREENING: MRSA by PCR: NEGATIVE

## 2015-07-07 IMAGING — CR DG CHEST 2V
2 series · 2 of 2 positions shown · non-contrast
Comparison: 07/09/2012 CT.

CLINICAL DATA: Left-sided chest pain.

CHEST - 2 VIEW

[w chest pa]
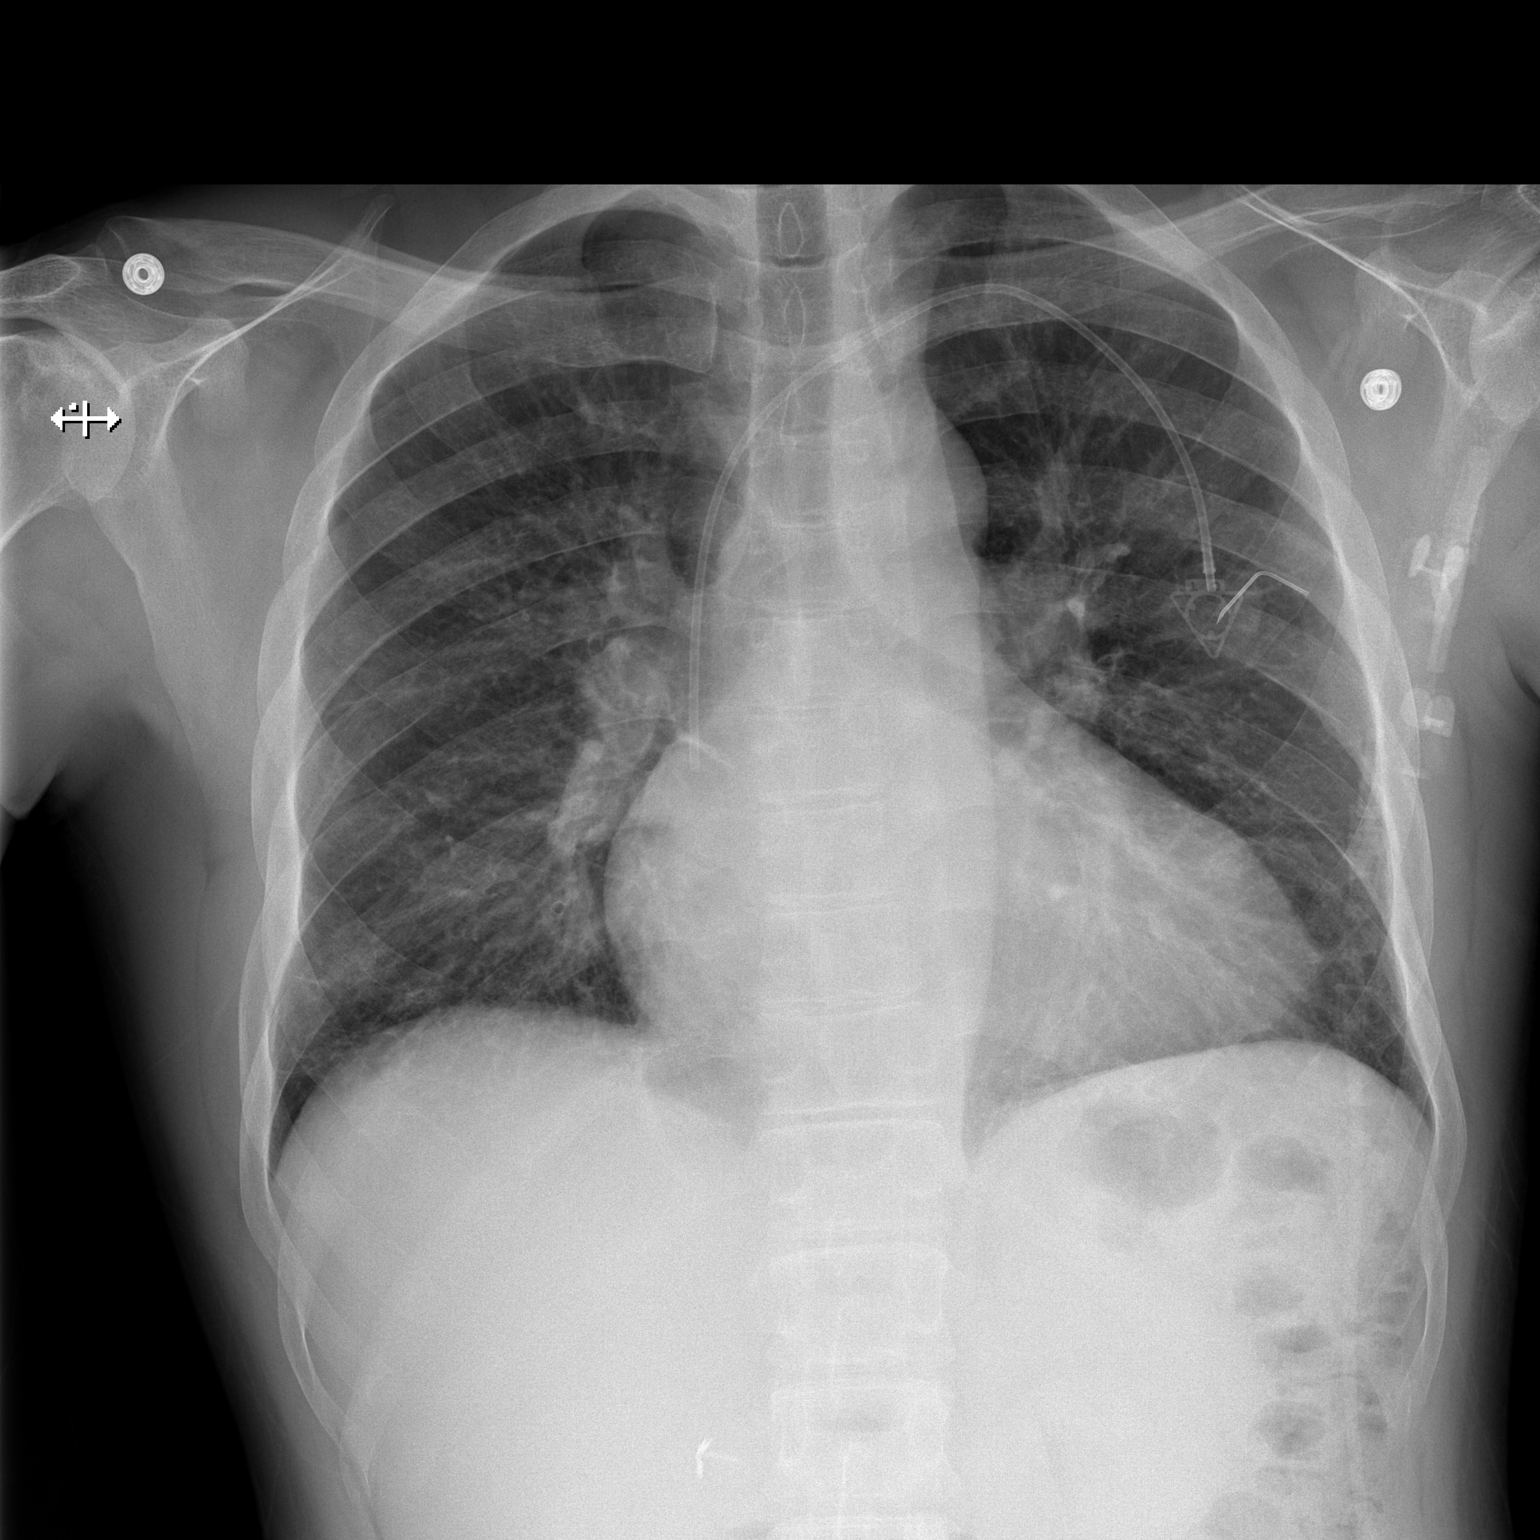

[w chest lat]
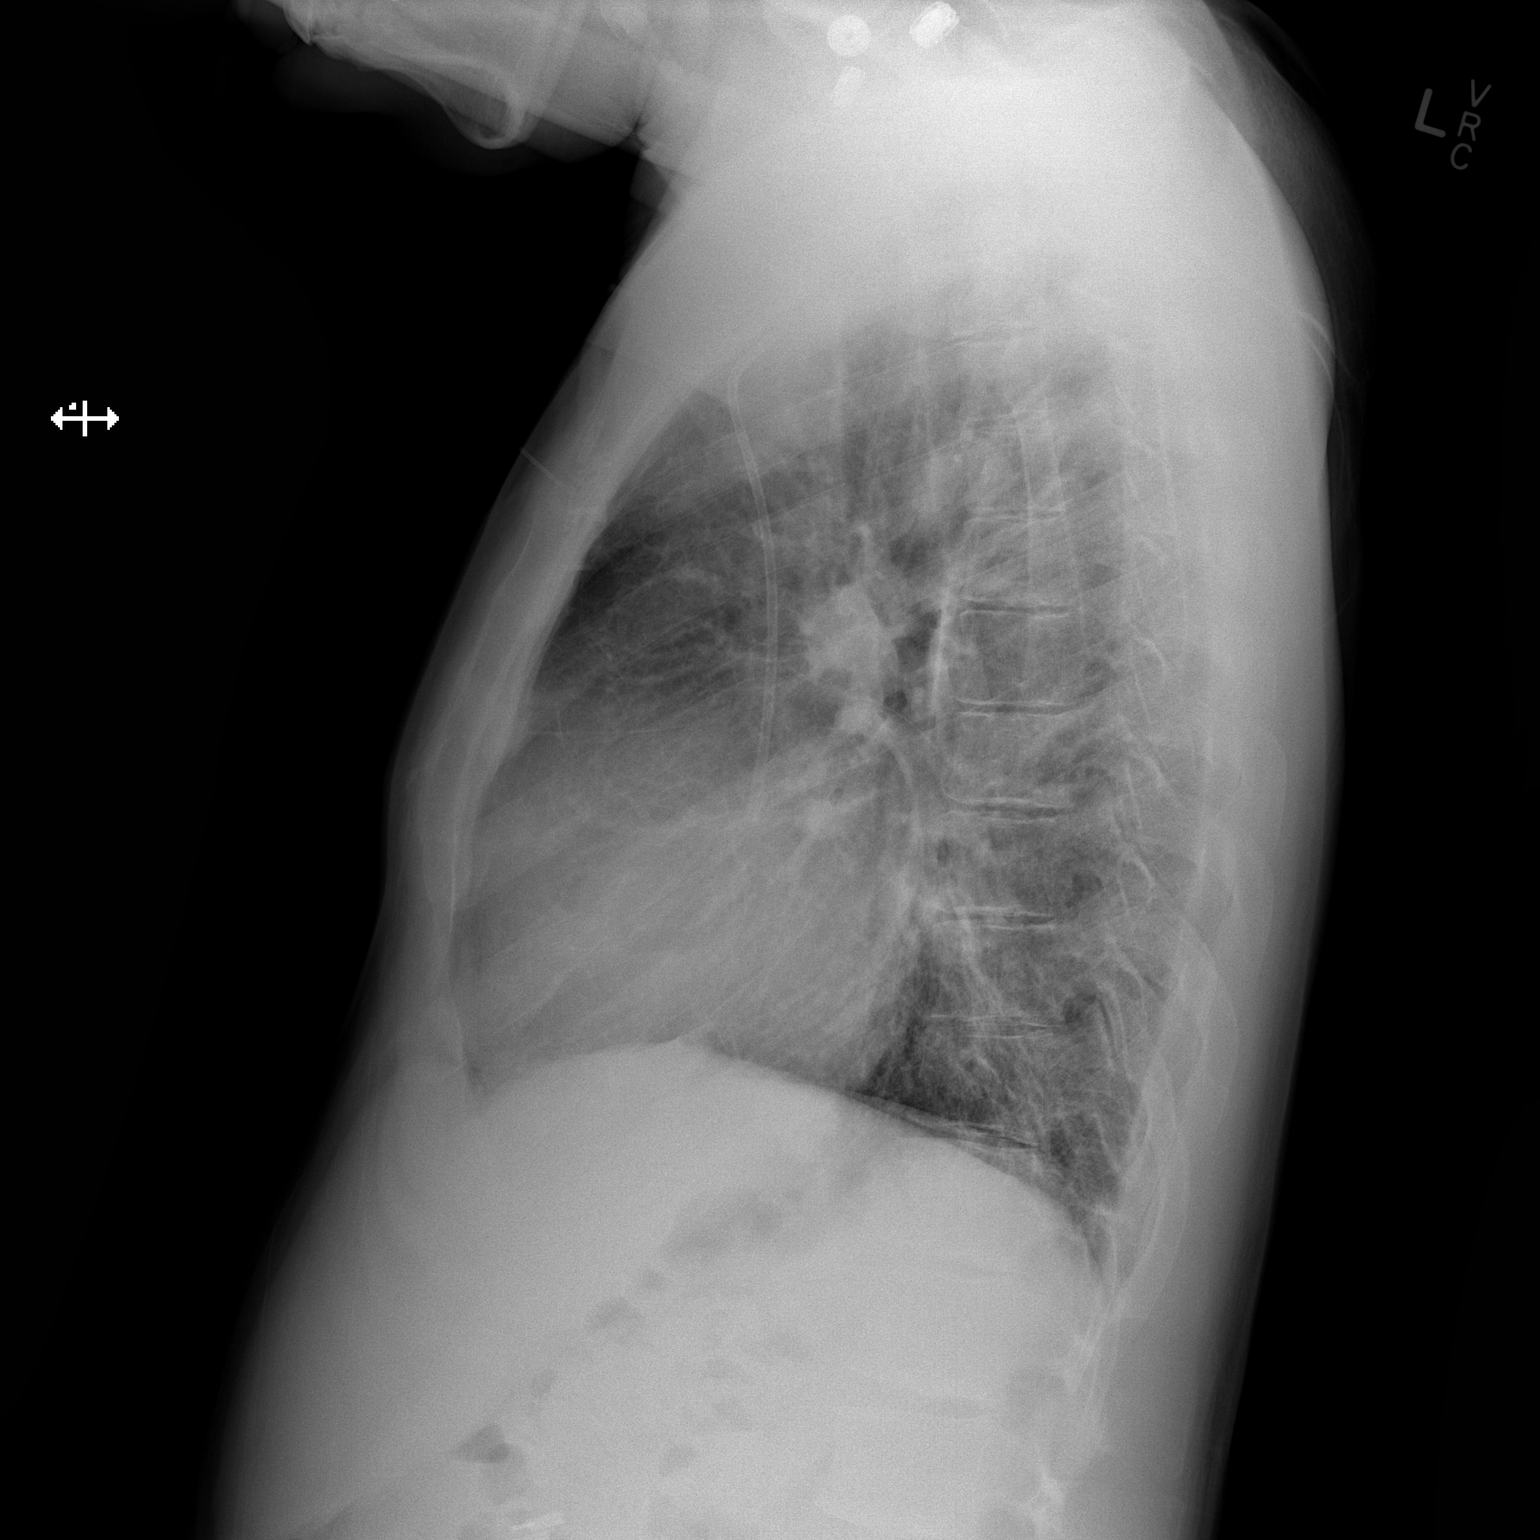

[2 of 2 positions shown; findings below may reference images not displayed]

FINDINGS: Left subclavian power port is present with the position
unchanged compared to prior.  The tip is in the superior vena cava
just superior to the cavoatrial junction.  Cardiopericardial
silhouette remains mildly enlarged for projection.  The chronic
pulmonary parenchymal scarring is present with interstitial
prominence for age.  No airspace disease.  No focal consolidation
or effusion.  Cholecystectomy.
IMPRESSION: 1.  Cardiomegaly eight and interstitial changes compatible with
sickle cell anemia.
2.  No radiographic findings of acute chest syndrome.

## 2015-07-07 MED ORDER — HYDROMORPHONE HCL 4 MG PO TABS: 4.0000 mg | ORAL_TABLET | ORAL | Status: DC | PRN

## 2015-07-07 MED ORDER — KETOROLAC TROMETHAMINE 30 MG/ML IJ SOLN
30.0000 mg | Freq: Four times a day (QID) | INTRAMUSCULAR | Status: DC
  Administered 2015-07-07 (×3): 30 mg via INTRAVENOUS
  Filled 2015-07-07 (×2): qty 1

## 2015-07-07 MED ORDER — RIVAROXABAN 15 MG PO TABS
15.0000 mg | ORAL_TABLET | Freq: Every day | ORAL | Status: DC
  Administered 2015-07-07: 15 mg via ORAL
  Filled 2015-07-07 (×2): qty 1

## 2015-07-07 MED ORDER — MORPHINE SULFATE ER 30 MG PO TBCR: 30.0000 mg | EXTENDED_RELEASE_TABLET | Freq: Two times a day (BID) | ORAL | Status: DC

## 2015-07-07 MED ORDER — HYDROMORPHONE HCL 1 MG/ML IJ SOLN
1.0000 mg | INTRAMUSCULAR | Status: DC | PRN
  Administered 2015-07-07 – 2015-07-08 (×8): 1 mg via INTRAVENOUS
  Filled 2015-07-07 (×8): qty 1

## 2015-07-07 MED ORDER — KETOROLAC TROMETHAMINE 30 MG/ML IJ SOLN
15.0000 mg | Freq: Four times a day (QID) | INTRAMUSCULAR | Status: DC
  Administered 2015-07-08 (×2): 15 mg via INTRAVENOUS
  Filled 2015-07-07 (×2): qty 1

## 2015-07-07 NOTE — Progress Notes (Signed)
Patient ID: [name], male   DOB: [birth year], 35 y.o.   MRN: [id number] Subjective:  Opiate tolerant patient with medical history significant for sickle cell anemia, pulmonary hypertension, chronic respiratory failure non-compliant with home oxygen use, history of PE and hypertension admitted yesterday for sickle cell pain crisis and hypoxia. Pain is increasing in intensity today but hypoxia is much improved, oximeter sensor placed on earlobe instead of tip of finger, and P02 went up to 100 % from 77%. Patient has since saturated above 95% consistently on 2 L of oxygen. He denies chest pain, no fever, no headache.  Objective:  Vital signs in last 24 hours:  Filed Vitals:   07/07/15 1236 07/07/15 1414 07/07/15 1522 07/07/15 1759  BP:  119/73  116/84  Pulse:  94  93  Temp:  98.9 F (37.2 C)  98.4 F (36.9 C)  TempSrc:  Oral  Oral  Resp: 21 20 19 18   Height:      Weight:      SpO2: 97% 93%  92%   Intake/Output from previous day:  Intake/Output Summary (Last 24 hours) at 07/07/15 1813 Last data filed at 07/07/15 1415  Gross per 24 hour  Intake    840 ml  Output   1420 ml  Net   -580 ml    Physical Exam: General: Alert, awake, oriented x3, in no acute distress.  HEENT: [name], EOMI Neck: Trachea midline,  no masses, no thyromegal,y no JVD, no carotid bruit OROPHARYNX:  Moist, No exudate/ erythema/lesions.  Heart: Regular rate and rhythm, without murmurs, rubs, gallops, PMI non-displaced, no heaves or thrills on palpation.  Lungs: Clear to auscultation, no wheezing or rhonchi noted. No increased vocal fremitus resonant to percussion  Abdomen: Soft, nontender, nondistended, positive bowel sounds, no masses no hepatosplenomegaly noted..  Neuro: No focal neurological deficits noted cranial nerves II through XII grossly intact. DTRs 2+ bilaterally upper and lower extremities. Strength 5 out of 5 in bilateral upper and lower extremities. Musculoskeletal: No warm swelling or  erythema around joints, no spinal tenderness noted. Psychiatric: Patient alert and oriented x3, good insight and cognition, good recent to remote recall. Lymph node survey: No cervical axillary or inguinal lymphadenopathy noted.  Lab Results:  Basic Metabolic Panel:    Component Value Date/Time   NA 136 07/07/2015 0500   K 4.1 07/07/2015 0500   CL 107 07/07/2015 0500   CO2 22 07/07/2015 0500   BUN 12 07/07/2015 0500   CREATININE 0.78 07/07/2015 0500   CREATININE 0.64 08/22/2013 1235   GLUCOSE 121* 07/07/2015 0500   CALCIUM 9.0 07/07/2015 0500   CBC:    Component Value Date/Time   WBC 19.1* 07/07/2015 0500   HGB 6.2* 07/07/2015 0500   HCT 17.4* 07/07/2015 0500   PLT 537* 07/07/2015 0500   MCV 94.6 07/07/2015 0500   NEUTROABS 14.1* 07/07/2015 0500   LYMPHSABS 2.3 07/07/2015 0500   MONOABS 2.3* 07/07/2015 0500   EOSABS 0.2 07/07/2015 0500   BASOSABS 0.2* 07/07/2015 0500    Recent Results (from the past 240 hour(s))  MRSA PCR Screening     Status: None   Collection Time: 07/07/15  4:23 AM  Result Value Ref Range Status   MRSA by PCR NEGATIVE NEGATIVE Final    Comment:        The GeneXpert MRSA Assay (FDA approved for NASAL specimens only), is one component of a comprehensive MRSA colonization surveillance program. It is not intended to diagnose MRSA infection nor to guide  or monitor treatment for MRSA infections.     Studies/Results: X-ray Chest Pa And Lateral  07/07/2015  CLINICAL DATA:  Shortness of breath, history of sickle cell EXAM: CHEST  2 VIEW COMPARISON:  07/06/2015 FINDINGS: Moderate cardiac silhouette enlargement stable. Minimal vascular congestion. No pleural effusion. No consolidation. IMPRESSION: Cardiac enlargement stable with development of very mild vascular congestion. Electronically Signed   By: Skipper Cliche M.D.   On: 07/07/2015 11:27   Dg Chest 2 View  07/06/2015  CLINICAL DATA:  Sickle cell crisis with chest pain and shortness of breath  today. EXAM: CHEST  2 VIEW COMPARISON:  Chest x-ray dated 06/19/2015. FINDINGS: Cardiomegaly is stable. Left chest wall Port-A-Cath is stable in position with tip overlying the expected location of the cavoatrial junction. Pulmonary vasculature remains mildly prominent, without frank pulmonary edema. Suspect some degree of chronic pulmonary artery hypertension. No evidence of pneumonia. No pleural effusion or pneumothorax seen. Osseous structures about the chest are unremarkable. IMPRESSION: Stable cardiomegaly.  No acute findings. Electronically Signed   By: Franki Cabot M.D.   On: 07/06/2015 12:50    Medications: Scheduled Meds: . aspirin  81 mg Oral Daily  . cholecalciferol  2,000 Units Oral Daily  . enoxaparin  120 mg Subcutaneous Q24H  . folic acid  1 mg Oral q morning - 10a  . gabapentin  300 mg Oral TID  . HYDROmorphone   Intravenous Q4H  . hydroxyurea  1,000 mg Oral Daily  . ketorolac  30 mg Intravenous Q6H  . lisinopril  10 mg Oral Daily  . metoprolol succinate  25 mg Oral Daily   Continuous Infusions: . dextrose 5 % and 0.45% NaCl 100 mL/hr at 07/07/15 0950   PRN Meds:.cyclobenzaprine, diphenhydrAMINE **OR** diphenhydrAMINE (BENADRYL) IVPB(SICKLE CELL ONLY), HYDROmorphone (DILAUDID) injection, naloxone **AND** sodium chloride flush, ondansetron (ZOFRAN) IV, zolpidem   Assessment/Plan: Active Problems:   Sickle cell pain crisis ([place])   Hypoxia   Pulmonary embolism without acute cor pulmonale ([place])  1. Hb SS with crisis: Pt rates pain at 8/10 and localized to ribs, back and legs much like his usual pain associated with Sickle Cell Crisis. Will continue the PCA dilaudid the current dose, and add scheduled clinician doses of Dilaudid, Toradol and continue IVF.  2. Leukocytosis: No evidence of infection. Likely related to bone marrow activity 3. Hypoxia: Resolved, pulse oximeter placed on earlobe, oxygenation improved to 95% and above on 2 L 4. Anemia: Hb currently at baseline.  Expect some dilutional effect and decrease in Hb. Labs in am 5. Chronic pain: Continue home meds 6. Chronic Respiratory Failure: Currently at baseline.  7. Chronic Anticoagulation: Pt on Lovenox chronically secondary to recurrent pulmonary emboli recently changed to Xarelto at [facility] when he was admitted for a new PE despite being on Lovenox. Will change Lovenox to Xarelto   Code Status: Full Code Family Communication: N/A  Disposition Plan: Not yet ready for discharge  Caylah Plouff  If 7PM-7AM, please contact night-coverage.  07/07/2015, 6:13 PM  LOS: 1 day

## 2015-07-07 NOTE — Progress Notes (Signed)
Nursing Note: Report given to Monica,rn.[email or web address]

## 2015-07-07 NOTE — Care Management Note (Signed)
Case Management Note  Patient Details  Name: [name] MRN: [id number] Date of Birth: 1980/07/27  Subjective/Objective:      35 yo admitted with [facility]              Action/Plan: From home. Pt uses 02 at home.  Chart reviewed and CM following for dc needs.  Expected Discharge Date:                  Expected Discharge Plan:  Home/Self Care  In-House Referral:     Discharge planning Services  CM Consult  Post Acute Care Choice:    Choice offered to:     DME Arranged:    DME Agency:     HH Arranged:    HH Agency:     Status of Service:  In process, will continue to follow  Medicare Important Message Given:    Date Medicare IM Given:    Medicare IM give by:    Date Additional Medicare IM Given:    Additional Medicare Important Message give by:     If discussed at [place] of Stay Meetings, dates discussed:    Additional CommentsLynnell Catalan, RN 07/07/2015, 11:57 AM  [phone]

## 2015-07-07 NOTE — Progress Notes (Signed)
Received report .wbb

## 2015-07-08 ENCOUNTER — Inpatient Hospital Stay (HOSPITAL_COMMUNITY): Payer: Medicare Other

## 2015-07-08 ENCOUNTER — Telehealth: Payer: Self-pay | Admitting: Pulmonary Disease

## 2015-07-08 ENCOUNTER — Ambulatory Visit: Payer: Self-pay | Admitting: Family Medicine

## 2015-07-08 DIAGNOSIS — D5700 Hb-SS disease with crisis, unspecified: Secondary | ICD-10-CM | POA: Diagnosis not present

## 2015-07-08 DIAGNOSIS — I2699 Other pulmonary embolism without acute cor pulmonale: Secondary | ICD-10-CM | POA: Diagnosis not present

## 2015-07-08 DIAGNOSIS — R0902 Hypoxemia: Secondary | ICD-10-CM

## 2015-07-08 DIAGNOSIS — E46 Unspecified protein-calorie malnutrition: Secondary | ICD-10-CM | POA: Diagnosis not present

## 2015-07-08 DIAGNOSIS — I272 Other secondary pulmonary hypertension: Secondary | ICD-10-CM

## 2015-07-08 DIAGNOSIS — I2782 Chronic pulmonary embolism: Secondary | ICD-10-CM | POA: Diagnosis not present

## 2015-07-08 DIAGNOSIS — Z96641 Presence of right artificial hip joint: Secondary | ICD-10-CM | POA: Diagnosis not present

## 2015-07-08 DIAGNOSIS — J9621 Acute and chronic respiratory failure with hypoxia: Secondary | ICD-10-CM | POA: Diagnosis not present

## 2015-07-08 DIAGNOSIS — J209 Acute bronchitis, unspecified: Secondary | ICD-10-CM

## 2015-07-08 DIAGNOSIS — R0602 Shortness of breath: Secondary | ICD-10-CM | POA: Diagnosis not present

## 2015-07-08 DIAGNOSIS — D638 Anemia in other chronic diseases classified elsewhere: Secondary | ICD-10-CM | POA: Diagnosis not present

## 2015-07-08 LAB — COMPREHENSIVE METABOLIC PANEL
ALBUMIN: 4.2 g/dL (ref 3.5–5.0)
ALK PHOS: 72 U/L (ref 38–126)
ALT: 17 U/L (ref 17–63)
AST: 28 U/L (ref 15–41)
Anion gap: 7 (ref 5–15)
BILIRUBIN TOTAL: 2.7 mg/dL — AB (ref 0.3–1.2)
BUN: 13 mg/dL (ref 6–20)
CALCIUM: 8.8 mg/dL — AB (ref 8.9–10.3)
CO2: 21 mmol/L — ABNORMAL LOW (ref 22–32)
Chloride: 109 mmol/L (ref 101–111)
Creatinine, Ser: 0.81 mg/dL (ref 0.61–1.24)
GFR calc Af Amer: >60 mL/min (ref >60)
GFR calc non Af Amer: >60 mL/min (ref >60)
GLUCOSE: 117 mg/dL — AB (ref 65–99)
Potassium: 4.3 mmol/L (ref 3.5–5.1)
Sodium: 137 mmol/L (ref 135–145)
TOTAL PROTEIN: 7.7 g/dL (ref 6.5–8.1)

## 2015-07-08 LAB — CBC WITH DIFFERENTIAL/PLATELET
BAND NEUTROPHILS: 0 %
BASOS ABS: 0.5 10*3/uL — AB (ref 0.0–0.1)
BASOS PCT: 3 %
Blasts: 0 %
EOS ABS: 0.2 10*3/uL (ref 0.0–0.7)
EOS PCT: 1 %
HCT: 16.8 % — ABNORMAL LOW (ref 39.0–52.0)
Hemoglobin: 6.1 g/dL — CL (ref 13.0–17.0)
LYMPHS ABS: 2.1 10*3/uL (ref 0.7–4.0)
Lymphocytes Relative: 13 %
MCH: 34.5 pg — ABNORMAL HIGH (ref 26.0–34.0)
MCHC: 36.3 g/dL — ABNORMAL HIGH (ref 30.0–36.0)
MCV: 94.9 fL (ref 78.0–100.0)
METAMYELOCYTES PCT: 0 %
MONO ABS: 2.1 10*3/uL — AB (ref 0.1–1.0)
MONOS PCT: 13 %
MYELOCYTES: 0 %
NEUTROS ABS: 11.3 10*3/uL — AB (ref 1.7–7.7)
Neutrophils Relative %: 70 %
Other: 0 %
Platelets: 541 10*3/uL — ABNORMAL HIGH (ref 150–400)
Promyelocytes Absolute: 0 %
RBC: 1.77 MIL/uL — ABNORMAL LOW (ref 4.22–5.81)
RDW: 21 % — AB (ref 11.5–15.5)
WBC: 16.2 10*3/uL — ABNORMAL HIGH (ref 4.0–10.5)
nRBC: 7 /100 WBC — ABNORMAL HIGH

## 2015-07-08 LAB — RETICULOCYTES
RBC.: 1.77 MIL/uL — ABNORMAL LOW (ref 4.22–5.81)
Retic Count, Absolute: 283.2 10*3/uL — ABNORMAL HIGH (ref 19.0–186.0)
Retic Ct Pct: 16 % — ABNORMAL HIGH (ref 0.4–3.1)

## 2015-07-08 LAB — HEMOGLOBIN AND HEMATOCRIT, BLOOD
HCT: 19.5 % — ABNORMAL LOW (ref 39.0–52.0)
Hemoglobin: 6.9 g/dL — CL (ref 13.0–17.0)

## 2015-07-08 LAB — PREPARE RBC (CROSSMATCH)

## 2015-07-08 MED ORDER — ACETAMINOPHEN 325 MG PO TABS: 650.0000 mg | ORAL_TABLET | Freq: Four times a day (QID) | ORAL | Status: DC | PRN

## 2015-07-08 MED ORDER — SENNOSIDES-DOCUSATE SODIUM 8.6-50 MG PO TABS
1.0000 | ORAL_TABLET | Freq: Two times a day (BID) | ORAL | Status: DC
  Administered 2015-07-08 – 2015-07-12 (×7): 1 via ORAL
  Filled 2015-07-08 (×7): qty 1

## 2015-07-08 MED ORDER — RIVAROXABAN 15 MG PO TABS
15.0000 mg | ORAL_TABLET | Freq: Two times a day (BID) | ORAL | Status: DC
  Administered 2015-07-08 – 2015-07-12 (×9): 15 mg via ORAL
  Filled 2015-07-08 (×14): qty 1

## 2015-07-08 MED ORDER — RIVAROXABAN 20 MG PO TABS: 20.0000 mg | ORAL_TABLET | Freq: Every day | ORAL | Status: DC

## 2015-07-08 MED ORDER — SODIUM CHLORIDE 0.9 % IV SOLN: Freq: Once | INTRAVENOUS | Status: DC

## 2015-07-08 MED ORDER — AZITHROMYCIN 250 MG PO TABS
250.0000 mg | ORAL_TABLET | Freq: Every day | ORAL | Status: AC
  Administered 2015-07-09 – 2015-07-12 (×4): 250 mg via ORAL
  Filled 2015-07-08 (×4): qty 1

## 2015-07-08 MED ORDER — HYDROMORPHONE HCL 2 MG/ML IJ SOLN
2.0000 mg | INTRAMUSCULAR | Status: DC | PRN
  Administered 2015-07-08 – 2015-07-09 (×5): 2 mg via INTRAVENOUS
  Filled 2015-07-08 (×5): qty 1

## 2015-07-08 MED ORDER — AZITHROMYCIN 250 MG PO TABS
500.0000 mg | ORAL_TABLET | Freq: Every day | ORAL | Status: AC
  Administered 2015-07-08: 500 mg via ORAL
  Filled 2015-07-08: qty 2

## 2015-07-08 NOTE — Telephone Encounter (Signed)
[name] has been in [facility] hospital for sickle cell and Dr Test would like to discuss with BQ about their findings and is pretty sure patient will end up in hospital again here. BQ Dr Test is aware that you are on night float tonight and would like for you to call him. Thanks.

## 2015-07-08 NOTE — Progress Notes (Signed)
Patients O2 sats reading 84-87 on [place] 5-9L. Respiratory therapist on floor at bedside with the nurse O2 increased to 10 L with  O2 sats increasing to 92%. Dr. Jonelle Sidle notified to make aware of above new orders given and MD is on his way to assess the patient.

## 2015-07-08 NOTE — Progress Notes (Signed)
Subjective: This is a 35 year old gentleman with known history of sickle cell disease well-known to our service with chronic pulmonary hypertension on home O2. Patient is not to be noncompliant. He was recently admitted at [facility] was pulmonary emboli as well as sickle cell crisis. He came home about a week ago but is back in the hospital again with crisis. He was hypoxic on arrival but was placed on his 2 L of oxygen as of yesterday his oxygen sats where or K12 liters at 95%. Since then he has gradually declined. This morning his oxygen sat is 92% on 10 L of oxygen. He does not appear to be in respiratory distress but he appears to have gradual worsening. Chest x-ray has been ordered now showing no significant change from the chest x-ray yesterday with some mild edema. His ABG showed PO2 of 71 and PCO2 of 33 with ABG of 7.3.  Patient is also on Dilaudid PCA for his sickle cell crisis. He has so far used 55 mg with 84 demands and 78 deliveries in the last 24 hours. He is on physician assisted dosing at 1 mg every 3 hours. He still complains of 8 out of 10 pain in his back and legs.  Objective: Vital signs in last 24 hours: Temp:  [98.4 F (36.9 C)-99.1 F (37.3 C)] 98.7 F (37.1 C) (06/07 0530) Pulse Rate:  [93-103] 98 (06/07 0530) Resp:  [15-21] 15 (06/07 0822) BP: (111-128)/(73-88) 111/76 mmHg (06/07 0530) SpO2:  [89 %-97 %] 89 % (06/07 0822) FiO2 (%):  [94 %-96 %] 94 % (06/06 1522) Weight:  [72.5 kg (159 lb 13.3 oz)] 72.5 kg (159 lb 13.3 oz) (06/07 0530) Weight change: -0.075 kg (-2.7 oz) Last BM Date: 07/07/15  Intake/Output from previous day: 06/06 0701 - 06/07 0700 In: 600 [P.O.:600] Out: 1375 [Urine:1375] Intake/Output this shift: Total I/O In: 600 [I.V.:600] Out: -   General appearance: alert, cooperative, appears stated age and mild distress Head: Normocephalic, without obvious abnormality, atraumatic Neck: no adenopathy, no carotid bruit, no JVD, supple, symmetrical,  trachea midline and thyroid not enlarged, symmetric, no tenderness/mass/nodules Back: symmetric, no curvature. ROM normal. No CVA tenderness. Resp: diminished breath sounds bilaterally, rales bilaterally and rhonchi bilaterally Chest wall: no tenderness Cardio: regular rate and rhythm, S1, S2 normal, no murmur, click, rub or gallop GI: soft, non-tender; bowel sounds normal; no masses,  no organomegaly Extremities: extremities normal, atraumatic, no cyanosis or edema Pulses: 2+ and symmetric Skin: Skin color, texture, turgor normal. No rashes or lesions Neurologic: Grossly normal  Lab Results:  Recent Labs  07/07/15 0500 07/08/15 0527  WBC 19.1* 16.2*  HGB 6.2* 6.1*  HCT 17.4* 16.8*  PLT 537* 541*   BMET  Recent Labs  07/07/15 0500 07/08/15 0527  NA 136 137  K 4.1 4.3  CL 107 109  CO2 22 21*  GLUCOSE 121* 117*  BUN 12 13  CREATININE 0.78 0.81  CALCIUM 9.0 8.8*    Studies/Results: X-ray Chest Pa And Lateral  07/07/2015  CLINICAL DATA:  Shortness of breath, history of sickle cell EXAM: CHEST  2 VIEW COMPARISON:  07/06/2015 FINDINGS: Moderate cardiac silhouette enlargement stable. Minimal vascular congestion. No pleural effusion. No consolidation. IMPRESSION: Cardiac enlargement stable with development of very mild vascular congestion. Electronically Signed   By: Skipper Cliche M.D.   On: 07/07/2015 11:27   Dg Chest 2 View  07/06/2015  CLINICAL DATA:  Sickle cell crisis with chest pain and shortness of breath today. EXAM: CHEST  2 VIEW COMPARISON:  Chest x-ray dated 06/19/2015. FINDINGS: Cardiomegaly is stable. Left chest wall Port-A-Cath is stable in position with tip overlying the expected location of the cavoatrial junction. Pulmonary vasculature remains mildly prominent, without frank pulmonary edema. Suspect some degree of chronic pulmonary artery hypertension. No evidence of pneumonia. No pleural effusion or pneumothorax seen. Osseous structures about the chest are  unremarkable. IMPRESSION: Stable cardiomegaly.  No acute findings. Electronically Signed   By: Franki Cabot M.D.   On: 07/06/2015 12:50    Medications: I have reviewed the patient's current medications.  Assessment/Plan: 35 year old gentleman with sickle cell painful crisis and acute on chronic hypoxemia.  #1 hypoxia: This is most likely multifactorial. He has chronic hypoxia from pulmonary hypertension but could also have some mild pulmonary edema as well as impending acute chest syndrome which is possible. Patient has had problems in the past where he was almost intubated. I will proceed to get pulmonary consult and transfer patient to step down unit. Keep her on the 10 L of oxygen and if he gets worse I might use BiPAP on nonrebreather bag or as per pulmonary. In the meantime his hemoglobin is only 6.1 sol transfuse at least one unit of packed red blood cells to give him more oxygen carrying capacity.  #2 sickle cell painful crisis: Patient will be maintain on current regimen. He is asking for increase in physician assisted dosing but I'm worried that Dilaudid may be contributing to his hypoxia so I will hold off for now. Continue close monitoring.  #3 anemia of chronic disease: Again patient will be transfused 1 unit of packed red blood cells am follow H&H closely.  #4 pulmonary hypertension: This is chronic and patient is currently on medication and Oxygen.  #5 Pulmonary Embolism: On xarelto. Will continue   LOS: 2 days   [name],[name] 07/08/2015, 10:41 AM

## 2015-07-08 NOTE — Progress Notes (Addendum)
Report called to [facility] on stepdown. Patient to be transferred to 1225 via bed.

## 2015-07-08 NOTE — Consult Note (Signed)
PULMONARY / CRITICAL CARE MEDICINE   Name: [name] MRN: [id number] DOB: [birth year]    ADMISSION DATE:  07/06/2015 CONSULTATION DATE:  07/08/2015  REFERRING MD:  Dr. 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  He was admitted for pain crisis.  He was started on IV fluid, supplemental oxygen, and pain medication.  He had progressive hypoxia and PCCM asked to assess.  There was question about whether his pulse oximeter was reading accurately on his finger >> had higher reading when placed on earlobe.  His baseline hemoglobin is around 8 >> currently 6.1.  He was at [facility] earlier this month: [place] 6/01 >> CI 4.2, RA 12, RV 56, PAP 37, PCWP 11, PVR 3.2 V/Q scan 5/30 >> high probability for PE Doppler legs b/l 5/23 >> no DVT Echo 5/23 >> severe RV systolic dysfx, mod/severe TR CT chest 5/23 >> b/l segmental/subsegmental PE, cardiomegaly  He feels like he needs more pain medication.  He is still having sharp pain across his right chest.  He denies headache, abdominal pain, leg pain, or nausea.  He has developed a cough with yellow/green sputum.  He has low grade temperature up to 99.1.  PAST MEDICAL HISTORY :  He  has a past medical history of Sickle cell anemia ([place]); Blood transfusion; Acute embolism and thrombosis of right internal jugular vein ([place]); Hypokalemia; Mood disorder ([place]); History of pulmonary embolus (PE); Avascular necrosis ([place]); Leukocytosis; Thrombocytosis ([place]); Hypertension; History of Clostridium difficile infection; Uses marijuana; Chronic anticoagulation; Functional asplenia; Former smoker; Second hand tobacco smoke exposure; Alcohol consumption of one to four drinks per day; Noncompliance with medication regimen; Sickle-cell crisis with associated acute chest syndrome ([place]) (05/13/2013); Acute chest syndrome ([place]) (06/18/2013); Demand ischemia ([place]) (01/02/2014);  Pulmonary hypertension ([place]); Hb-SS disease with crisis ([place]); and Oxygen deficiency.  PAST SURGICAL HISTORY: He  has past surgical history that includes Right hip replacement; Cholecystectomy; Porta cath placement; Porta cath removal; Umbilical hernia repair; Excision of left periauricular cyst; Excision of right ear lobe cyst with primary closur; and Portacath placement (01/05/2012).  No Known Allergies  No current facility-administered medications on file prior to encounter.   Current Outpatient Prescriptions on File Prior to Encounter  Medication Sig  . aspirin 81 MG chewable tablet Chew 1 tablet (81 mg total) by mouth daily.  . Cholecalciferol (VITAMIN D) 2000 units tablet Take 1 tablet (2,000 Units total) by mouth daily.  . folic acid (FOLVITE) 1 MG tablet Take 1 tablet (1 mg total) by mouth every morning.  . gabapentin (NEURONTIN) 300 MG capsule Take 1 capsule (300 mg total) by mouth 3 (three) times daily.  . hydroxyurea (HYDREA) 500 MG capsule Take 2 capsules (1,000 mg total) by mouth daily. May take with food to minimize GI side effects. (Patient taking differently: Take 1,500 mg by mouth daily. May take with food to minimize GI side effects.)  . potassium chloride SA (K-DUR,KLOR-CON) 20 MEQ tablet Take 1 tablet (20 mEq total) by mouth every morning. (Patient taking differently: Take 20 mEq by mouth daily. )  . zolpidem (AMBIEN) 10 MG tablet Take 1 tablet (10 mg total) by mouth at bedtime as needed for sleep.    FAMILY HISTORY:  His indicated that his mother is alive. He indicated that his father is deceased. He indicated that all of his three brothers are alive.   SOCIAL HISTORY: He  reports that he quit smoking about 4 years ago. His smoking use included Cigarettes. He has a 5 pack-year smoking history. He has never used smokeless tobacco. He reports that he does not drink alcohol or use illicit drugs.  REVIEW OF SYSTEMS:   Negative except above  SUBJECTIVE:  Wants his  dilaudid PCA increased to 2 mg.  VITAL SIGNS: BP 118/86 mmHg  Pulse 94  Temp(Src) 98.8 F (37.1 C) (Oral)  Resp 16  Ht 6' (1.829 m)  Wt 159 lb 13.3 oz (72.5 kg)  BMI 21.67 kg/m2  SpO2 94%  HEMODYNAMICS:    VENTILATOR SETTINGS: Vent Mode:  [-]  FiO2 (%):  [94 %-96 %] 94 %  INTAKE / OUTPUT: I/O last 3 completed shifts: In: 840 [P.O.:840] Out: 2395 [Urine:2395]  PHYSICAL EXAMINATION: General:  Alert Neuro:  Normal strength, CN intact HEENT:  No oral exudate, no stridor Cardiovascular: regular, 2/6 SM, port Lt chest Lungs:  No wheeze/rales Abdomen:  Soft, non tender, + bowel sounds Musculoskeletal:  No edema Skin:  No rashes  LABS:  BMET  Recent Labs Lab 07/06/15 1103 07/07/15 0500 07/08/15 0527  NA 137 136 137  K 3.9 4.1 4.3  CL 109 107 109  CO2 19* 22 21*  BUN 19 12 13   CREATININE 0.96 0.78 0.81  GLUCOSE 123* 121* 117*    Electrolytes  Recent Labs Lab 07/06/15 1103 07/07/15 0500 07/08/15 0527  CALCIUM 9.3 9.0 8.8*    CBC  Recent Labs Lab 07/06/15 1103 07/07/15 0500 07/08/15 0527  WBC 11.8* 19.1* 16.2*  HGB 6.5* 6.2* 6.1*  HCT 18.2* 17.4* 16.8*  PLT 550* 537* 541*    Coag's No results for input(s): APTT, INR in the last 168 hours.  Sepsis Markers No results for input(s): LATICACIDVEN, PROCALCITON, O2SATVEN in the last 168 hours.  ABG  Recent Labs Lab 07/08/15 0949  PHART 7.382  PCO2ART 33.7*  PO2ART 71.1*    Liver Enzymes  Recent Labs Lab 07/06/15 1103 07/07/15 0500 07/08/15 0527  AST 35 27 28  ALT 23 19 17   ALKPHOS 68 63 72  BILITOT 3.8* 3.6* 2.7*  ALBUMIN 4.3 4.2 4.2    Cardiac Enzymes No results for input(s): TROPONINI, PROBNP in the last 168 hours.  Glucose No results for input(s): GLUCAP in the last 168 hours.  Imaging Dg Chest 2 View  07/08/2015  CLINICAL DATA:  Shortness of breath,, hypoxia, history of sickle cell EXAM: CHEST  2 VIEW COMPARISON:  07/07/2015 FINDINGS: Cardiomegaly again noted. Left  subclavian Port-A-Cath is unchanged in position. No acute infiltrate or pleural effusion. No pulmonary edema. IMPRESSION: No active cardiopulmonary disease. Electronically Signed   By: Lahoma Crocker M.D.   On: 07/08/2015 11:01     STUDIES:  5/23 Doppler legs b/l >> no DVT 5/23 Echo >> severe RV systolic dysfx, mod/severe TR 5/23 CT chest >> b/l segmental/subsegmental PE, cardiomegaly 5/30 V/Q scan >> high probability for PE 6/01 RHC >> CI 4.2, RA 12, RV 56, PAP 37, PCWP 11, PVR 3.2  CULTURES: 6/07 Sputum >>   ANTIBIOTICS: 6/07 Zithromax >>   SIGNIFICANT EVENTS: 6/05 Admit 6/07 To SDU for hypoxia  LINES/TUBES: Lt chest Port >>   DISCUSSION: 35 yo male with cough, sputum, hypoxia in setting of Hb SS sickle cell anemia with pain crisis.  He had recent admit at [facility] for acute PE.  He has pulmonary hypertension.  ASSESSMENT / PLAN:  PULMONARY A: Acute on chronic hypoxic respiratory failure. P:   Oxygen to  keep SpO2 > 92% F/u CXR Bronchial hygiene  CARDIOVASCULAR A:  Recent dx of pulmonary embolism. Hx of HTN. Pulmonary hypertension. P:  Continue xarelto Might need to consider tx for pulmonary hypertension as outpt Continue toprol, lisinopril  RENAL A:   No acute issues. P:   Monitor renal fx, urine outpt, electrolytes  GASTROINTESTINAL A:   Protein calorie malnutrition. P:   Regular diet  HEMATOLOGIC A:   Hb SS sickle cell anemia with acute pain crisis. P:  Agree with plan to transfuse PRBC 6/07 F/u CBC Continue hydroxyurea Continue IV fluids, pain medications  INFECTIOUS A:   Acute bronchitis >> no infiltrate on CXR 6/07. P:   Sputum cx Day 1 of zithromax  ENDOCRINE A:   No acute issues.   P:   Monitor blood sugar on BMET  NEUROLOGIC A:   Chronic pain. P:   Dilaudid PCA per primary team  D/w Dr. Jonelle Sidle.  Chesley Mires, MD [facility] Pulmonary/Critical Care 07/08/2015, 11:15 AM Pager:  [phone] After 3pm call: [phone]

## 2015-07-09 ENCOUNTER — Inpatient Hospital Stay (HOSPITAL_COMMUNITY): Payer: Medicare Other

## 2015-07-09 DIAGNOSIS — D638 Anemia in other chronic diseases classified elsewhere: Secondary | ICD-10-CM | POA: Diagnosis not present

## 2015-07-09 DIAGNOSIS — I272 Other secondary pulmonary hypertension: Secondary | ICD-10-CM | POA: Diagnosis not present

## 2015-07-09 DIAGNOSIS — J9621 Acute and chronic respiratory failure with hypoxia: Secondary | ICD-10-CM | POA: Diagnosis not present

## 2015-07-09 DIAGNOSIS — D5700 Hb-SS disease with crisis, unspecified: Secondary | ICD-10-CM | POA: Diagnosis not present

## 2015-07-09 DIAGNOSIS — E46 Unspecified protein-calorie malnutrition: Secondary | ICD-10-CM | POA: Diagnosis not present

## 2015-07-09 DIAGNOSIS — I2782 Chronic pulmonary embolism: Secondary | ICD-10-CM | POA: Diagnosis not present

## 2015-07-09 DIAGNOSIS — R0602 Shortness of breath: Secondary | ICD-10-CM | POA: Diagnosis not present

## 2015-07-09 DIAGNOSIS — R0902 Hypoxemia: Secondary | ICD-10-CM | POA: Diagnosis not present

## 2015-07-09 DIAGNOSIS — I2699 Other pulmonary embolism without acute cor pulmonale: Secondary | ICD-10-CM | POA: Diagnosis not present

## 2015-07-09 DIAGNOSIS — Z96641 Presence of right artificial hip joint: Secondary | ICD-10-CM | POA: Diagnosis not present

## 2015-07-09 LAB — BLOOD GAS, ARTERIAL
ACID-BASE DEFICIT: 4.5 mmol/L — AB (ref 0.0–2.0)
BICARBONATE: 19.6 meq/L — AB (ref 20.0–24.0)
DRAWN BY: 331471
O2 Content: 10 L/min
O2 SAT: 88 %
Patient temperature: 98.6
TCO2: 19.2 mmol/L (ref 0–100)
pCO2 arterial: 33.7 mmHg — ABNORMAL LOW (ref 35.0–45.0)
pH, Arterial: 7.382 (ref 7.350–7.450)
pO2, Arterial: 71.1 mmHg — ABNORMAL LOW (ref 80.0–100.0)

## 2015-07-09 LAB — BASIC METABOLIC PANEL
Anion gap: 6 (ref 5–15)
BUN: 8 mg/dL (ref 6–20)
CALCIUM: 9.1 mg/dL (ref 8.9–10.3)
CHLORIDE: 106 mmol/L (ref 101–111)
CO2: 23 mmol/L (ref 22–32)
CREATININE: 0.67 mg/dL (ref 0.61–1.24)
GFR calc Af Amer: >60 mL/min (ref >60)
GFR calc non Af Amer: >60 mL/min (ref >60)
Glucose, Bld: 120 mg/dL — ABNORMAL HIGH (ref 65–99)
Potassium: 4.3 mmol/L (ref 3.5–5.1)
SODIUM: 135 mmol/L (ref 135–145)

## 2015-07-09 LAB — CBC
HCT: 19 % — ABNORMAL LOW (ref 39.0–52.0)
Hemoglobin: 6.8 g/dL — CL (ref 13.0–17.0)
MCH: 33 pg (ref 26.0–34.0)
MCHC: 35.8 g/dL (ref 30.0–36.0)
MCV: 92.2 fL (ref 78.0–100.0)
PLATELETS: 545 10*3/uL — AB (ref 150–400)
RBC: 2.06 MIL/uL — ABNORMAL LOW (ref 4.22–5.81)
RDW: 22.2 % — AB (ref 11.5–15.5)
WBC: 19.5 10*3/uL — AB (ref 4.0–10.5)

## 2015-07-09 MED ORDER — DEFERASIROX 360 MG PO TABS
1080.0000 mg | ORAL_TABLET | Freq: Every day | ORAL | Status: DC
  Administered 2015-07-09 – 2015-07-12 (×4): 1080 mg via ORAL

## 2015-07-09 MED ORDER — CETYLPYRIDINIUM CHLORIDE 0.05 % MT LIQD
7.0000 mL | Freq: Two times a day (BID) | OROMUCOSAL | Status: DC
  Administered 2015-07-09 – 2015-07-12 (×7): 7 mL via OROMUCOSAL

## 2015-07-09 MED ORDER — HYDROMORPHONE HCL 2 MG/ML IJ SOLN
2.0000 mg | INTRAMUSCULAR | Status: DC | PRN
  Administered 2015-07-09 – 2015-07-10 (×6): 2 mg via INTRAVENOUS
  Filled 2015-07-09 (×7): qty 1

## 2015-07-09 NOTE — Progress Notes (Signed)
PULMONARY / CRITICAL CARE MEDICINE   Name: [name] MRN: [id number] DOB: 10-29-1980    ADMISSION DATE:  07/06/2015 CONSULTATION DATE:  07/08/2015  REFERRING MD:  Dr. 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  He was admitted for pain crisis.  He was started on IV fluid, supplemental oxygen, and pain medication.  He had progressive hypoxia and PCCM asked to assess.  There was question about whether his pulse oximeter was reading accurately on his finger >> had higher reading when placed on earlobe.  His baseline hemoglobin is around 8 >> currently 6.1.  He was at [facility] earlier this month: [place] 6/01 >> CI 4.2, RA 12, RV 56, PAP 37, PCWP 11, PVR 3.2 V/Q scan 5/30 >> high probability for PE Doppler legs b/l 5/23 >> no DVT Echo 5/23 >> severe RV systolic dysfx, mod/severe TR CT chest 5/23 >> b/l segmental/subsegmental PE, cardiomegaly  SUBJECTIVE:  Feels better O2 needs reduced  VITAL SIGNS: BP 114/78 mmHg  Pulse 87  Temp(Src) 98.5 F (36.9 C) (Oral)  Resp 19  Ht 6' (1.829 m)  Wt 159 lb 13.3 oz (72.5 kg)  BMI 21.67 kg/m2  SpO2 96% 6 liters  HEMODYNAMICS:    VENTILATOR SETTINGS: Vent Mode:  [-]  FiO2 (%):  [55 %] 55 %  INTAKE / OUTPUT: I/O last 3 completed shifts: In: 1907.5 [I.V.:1607.5; Blood:300] Out: 3375 [Urine:3375]  PHYSICAL EXAMINATION: General:  Alert Neuro:  Normal strength, CN intact HEENT:  No oral exudate, no stridor Cardiovascular: regular, 2/6 SM, port Lt chest Lungs:  No wheeze/rales, nml WOB Abdomen:  Soft, non tender, + bowel sounds Musculoskeletal:  No edema Skin:  No rashes  LABS:  BMET  Recent Labs Lab 07/07/15 0500 07/08/15 0527 07/09/15 0310  NA 136 137 135  K 4.1 4.3 4.3  CL 107 109 106  CO2 22 21* 23  BUN 12 13 8   CREATININE 0.78 0.81 0.67  GLUCOSE 121* 117* 120*    Electrolytes  Recent Labs Lab  07/07/15 0500 07/08/15 0527 07/09/15 0310  CALCIUM 9.0 8.8* 9.1    CBC  Recent Labs Lab 07/07/15 0500 07/08/15 0527 07/08/15 2156 07/09/15 0310  WBC 19.1* 16.2*  --  19.5*  HGB 6.2* 6.1* 6.9* 6.8*  HCT 17.4* 16.8* 19.5* 19.0*  PLT 537* 541*  --  545*    Coag's No results for input(s): APTT, INR in the last 168 hours.  Sepsis Markers No results for input(s): LATICACIDVEN, PROCALCITON, O2SATVEN in the last 168 hours.  ABG  Recent Labs Lab 07/08/15 0949  PHART 7.382  PCO2ART 33.7*  PO2ART 71.1*    Liver Enzymes  Recent Labs Lab 07/06/15 1103 07/07/15 0500 07/08/15 0527  AST 35 27 28  ALT 23 19 17   ALKPHOS 68 63 72  BILITOT 3.8* 3.6* 2.7*  ALBUMIN 4.3 4.2 4.2    Cardiac Enzymes No results for input(s): TROPONINI, PROBNP in the last 168 hours.  Glucose No results for input(s): GLUCAP in the last 168 hours.  Imaging Dg Chest 2 View  07/08/2015  CLINICAL DATA:  Shortness of breath,, hypoxia, history of sickle cell EXAM: CHEST  2 VIEW COMPARISON:  07/07/2015 FINDINGS: Cardiomegaly again noted. Left subclavian Port-A-Cath is unchanged in position. No acute infiltrate or pleural effusion. No pulmonary edema. IMPRESSION: No active cardiopulmonary disease. Electronically Signed   By: Orlean Bradford.D.  On: 07/08/2015 11:01   Dg Chest Port 1 View  07/09/2015  CLINICAL DATA:  Shortness of breath.  Sickle cell disease EXAM: PORTABLE CHEST 1 VIEW COMPARISON:  July 08, 2015 FINDINGS: Port-A-Cath tip is in the superior cava. No pneumothorax. There is no edema or consolidation. There is generalized cardiac enlargement. There is pulmonary venous hypertension. No adenopathy evident. No bone lesions. IMPRESSION: Cardiomegaly with pulmonary vascular congestion.  No consolidation. Electronically Signed   By: Lowella Grip III M.D.   On: 07/09/2015 08:04  Aeration a little worse. C/w vascular congestion  STUDIES:  5/23 Doppler legs b/l >> no DVT 5/23 Echo >> severe RV  systolic dysfx, mod/severe TR 5/23 CT chest >> b/l segmental/subsegmental PE, cardiomegaly 5/30 V/Q scan >> high probability for PE 6/01 RHC >> CI 4.2, RA 12, RV 56, PAP 37, PCWP 11, PVR 3.2  CULTURES: 6/07 Sputum >>   ANTIBIOTICS: 6/07 Zithromax >>   SIGNIFICANT EVENTS: 6/05 Admit 6/07 To SDU for hypoxia  LINES/TUBES: Lt chest Port >>   DISCUSSION: 34 yo male with cough, sputum, hypoxia in setting of Hb SS sickle cell anemia with pain crisis.  He had recent admit at [facility] for acute PE.  He has pulmonary hypertension. Getting better after abx and transfusion. Weaning O2  ASSESSMENT / PLAN:  PULMONARY A: Acute on chronic hypoxic respiratory failure  Bronchitis  Recent PE PAH -->better. Not sure if this is from treating bronchitis OR transfusion or both.  P:   Oxygen to keep SpO2 > 92% Bronchial hygiene   CARDIOVASCULAR A:  Recent dx of pulmonary embolism. Hx of HTN. Pulmonary hypertension. P:  Continue xarelto Might need to consider tx for pulmonary hypertension as outpt Continue toprol, lisinopril  RENAL A:   No acute issues. P:   Monitor renal fx, urine outpt, electrolytes  GASTROINTESTINAL A:   Protein calorie malnutrition. P:   Regular diet  HEMATOLOGIC A:   Hb SS sickle cell anemia with acute pain crisis. P:  Transfusion per SS team  F/u CBC Continue hydroxyurea Continue IV fluids, pain medications  INFECTIOUS A:   Acute bronchitis >> no infiltrate on CXR 6/07. P:   F/u Sputum cx Day 2 of zithromax  ENDOCRINE A:   No acute issues.   P:   Monitor blood sugar on BMET  NEUROLOGIC A:   Chronic pain. P:   Dilaudid PCA per primary team    Erick Colace ACNP-BC [place] Pager # [phone] OR # [phone] if no answer    Breathing and chest pain better.  Still has cough with sputum.  Alert, no wheeze, HR regular, Abd soft, no edema.  CXR - CM, but no infiltrate  Hb 6.8, WBC 19.5, Creatinine  0.67.  Assessment/plan:  Acute bronchitis. - day 2 of zithromax  Acute pain crisis with hx of Hb SS sickle cell anemia. - f/u CBC >> baseline Hb 6 to 7 - continue IV fluids, pain meds per primary team  Acute on chronic hypoxic respiratory failure. - goal SpO2 > 92%  Recent dx of PE. - continue xarelto  Pulmonary hypertension. - f/u with Dr. Lake Bells and [facility] as outpt   Pulmonary status improved.  I have scheduled him for pulmonary office follow up on Thursday, June 22 at 2 pm with Rexene Edison >> he can then have f/u with Dr. Lake Bells.  PCCM will sign off.  Please call if further help needed while he is in hospital.  Okay to transfer out of SDU  from pulmonary standpoint >> defer to primary team.  Updated pt's girlfriend at bedside.   Chesley Mires, MD [facility] Pulmonary/Critical Care 07/09/2015, 9:57 AM Pager:  [phone] After 3pm call: [phone]

## 2015-07-09 NOTE — Progress Notes (Signed)
Subjective: Patient is doing much better in stepdown unit. His pain is still at 8 out of 10 but he is now on 7 L of oxygen is set of 10 L. He denied shortness of breath or cough denied any nausea vomiting or diarrhea. Patient still has low-grade temperature but no evidence of infection. He has not been out of bed but for the most part feels much better. He is on the Dilaudid PCA and has used 57 mg with 89 demands and 89 deliveries.  Objective: Vital signs in last 24 hours: Temp:  [98.6 F (37 C)-99.3 F (37.4 C)] 99.1 F (37.3 C) (06/08 0453) Pulse Rate:  [39-102] 94 (06/08 0600) Resp:  [14-28] 17 (06/08 0600) BP: (118-139)/(83-94) 134/89 mmHg (06/08 0400) SpO2:  [89 %-100 %] 98 % (06/08 0600) FiO2 (%):  [55 %] 55 % (06/07 1834) Weight change:  Last BM Date: 07/07/15  Intake/Output from previous day: 06/07 0701 - 06/08 0700 In: 1357.5 [I.V.:1057.5; Blood:300] Out: 2400 [Urine:2400] Intake/Output this shift: Total I/O In: -  Out: 1200 [Urine:1200]  General appearance: alert, cooperative, appears stated age and mild distress Head: Normocephalic, without obvious abnormality, atraumatic Neck: no adenopathy, no carotid bruit, no JVD, supple, symmetrical, trachea midline and thyroid not enlarged, symmetric, no tenderness/mass/nodules Back: symmetric, no curvature. ROM normal. No CVA tenderness. Resp: diminished breath sounds bilaterally, rales bilaterally and rhonchi bilaterally Chest wall: no tenderness Cardio: regular rate and rhythm, S1, S2 normal, no murmur, click, rub or gallop GI: soft, non-tender; bowel sounds normal; no masses,  no organomegaly Extremities: extremities normal, atraumatic, no cyanosis or edema Pulses: 2+ and symmetric Skin: Skin color, texture, turgor normal. No rashes or lesions Neurologic: Grossly normal  Lab Results:  Recent Labs  07/08/15 0527 07/08/15 2156 07/09/15 0310  WBC 16.2*  --  19.5*  HGB 6.1* 6.9* 6.8*  HCT 16.8* 19.5* 19.0*  PLT 541*   --  545*   BMET  Recent Labs  07/08/15 0527 07/09/15 0310  NA 137 135  K 4.3 4.3  CL 109 106  CO2 21* 23  GLUCOSE 117* 120*  BUN 13 8  CREATININE 0.81 0.67  CALCIUM 8.8* 9.1    Studies/Results: Dg Chest 2 View  07/08/2015  CLINICAL DATA:  Shortness of breath,, hypoxia, history of sickle cell EXAM: CHEST  2 VIEW COMPARISON:  07/07/2015 FINDINGS: Cardiomegaly again noted. Left subclavian Port-A-Cath is unchanged in position. No acute infiltrate or pleural effusion. No pulmonary edema. IMPRESSION: No active cardiopulmonary disease. Electronically Signed   By: Lahoma Crocker M.D.   On: 07/08/2015 11:01   X-ray Chest Pa And Lateral  07/07/2015  CLINICAL DATA:  Shortness of breath, history of sickle cell EXAM: CHEST  2 VIEW COMPARISON:  07/06/2015 FINDINGS: Moderate cardiac silhouette enlargement stable. Minimal vascular congestion. No pleural effusion. No consolidation. IMPRESSION: Cardiac enlargement stable with development of very mild vascular congestion. Electronically Signed   By: Skipper Cliche M.D.   On: 07/07/2015 11:27    Medications: I have reviewed the patient's current medications.  Assessment/Plan: 35 year old gentleman with sickle cell painful crisis and acute on chronic hypoxemia.  #1 hypoxia: Patient is improving after transfusion as well as care in the stepdown unit. We will continue current care unchanged.  #2 sickle cell painful crisis: I will change his physician assisted dosing to 2 mg every 3 hours when necessary. Patient will be maintained on current PCA  regimen.  Continue close monitoring. #3 anemia of chronic disease: Patient has received one unit of  packed red blood cells and his hemoglobin has improved to 6.9. We'll continue to monitor #4 pulmonary hypertension: This is chronic and patient is currently on medication and Oxygen. #5 Pulmonary Embolism: On xarelto. Will continue   LOS: 3 days   [name],[name] 07/09/2015, 6:57 AM

## 2015-07-09 NOTE — Telephone Encounter (Signed)
I spoke to Dr. Karena Addison this morning

## 2015-07-10 DIAGNOSIS — Z96641 Presence of right artificial hip joint: Secondary | ICD-10-CM | POA: Diagnosis not present

## 2015-07-10 DIAGNOSIS — I2699 Other pulmonary embolism without acute cor pulmonale: Secondary | ICD-10-CM | POA: Diagnosis not present

## 2015-07-10 DIAGNOSIS — D638 Anemia in other chronic diseases classified elsewhere: Secondary | ICD-10-CM | POA: Diagnosis not present

## 2015-07-10 DIAGNOSIS — D5700 Hb-SS disease with crisis, unspecified: Secondary | ICD-10-CM | POA: Diagnosis not present

## 2015-07-10 DIAGNOSIS — E46 Unspecified protein-calorie malnutrition: Secondary | ICD-10-CM | POA: Diagnosis not present

## 2015-07-10 DIAGNOSIS — J9621 Acute and chronic respiratory failure with hypoxia: Secondary | ICD-10-CM | POA: Diagnosis not present

## 2015-07-10 DIAGNOSIS — R0902 Hypoxemia: Secondary | ICD-10-CM | POA: Diagnosis not present

## 2015-07-10 LAB — CBC WITH DIFFERENTIAL/PLATELET
BASOS ABS: 0.4 10*3/uL — AB (ref 0.0–0.1)
BASOS PCT: 2 %
Band Neutrophils: 0 %
Blasts: 0 %
EOS ABS: 0.7 10*3/uL (ref 0.0–0.7)
Eosinophils Relative: 4 %
HEMATOCRIT: 18.4 % — AB (ref 39.0–52.0)
HEMOGLOBIN: 6.7 g/dL — AB (ref 13.0–17.0)
LYMPHS ABS: 2.6 10*3/uL (ref 0.7–4.0)
Lymphocytes Relative: 15 %
MCH: 34.7 pg — ABNORMAL HIGH (ref 26.0–34.0)
MCHC: 36.4 g/dL — ABNORMAL HIGH (ref 30.0–36.0)
MCV: 95.3 fL (ref 78.0–100.0)
METAMYELOCYTES PCT: 0 %
MYELOCYTES: 0 %
Monocytes Absolute: 1.9 10*3/uL — ABNORMAL HIGH (ref 0.1–1.0)
Monocytes Relative: 11 %
NEUTROS PCT: 68 %
NRBC: 18 /100{WBCs} — AB
Neutro Abs: 12 10*3/uL — ABNORMAL HIGH (ref 1.7–7.7)
Other: 0 %
PROMYELOCYTES ABS: 0 %
Platelets: 524 10*3/uL — ABNORMAL HIGH (ref 150–400)
RBC: 1.93 MIL/uL — ABNORMAL LOW (ref 4.22–5.81)
RDW: 22.6 % — ABNORMAL HIGH (ref 11.5–15.5)
WBC: 17.6 10*3/uL — AB (ref 4.0–10.5)

## 2015-07-10 LAB — COMPREHENSIVE METABOLIC PANEL
ALBUMIN: 3.8 g/dL (ref 3.5–5.0)
ALK PHOS: 67 U/L (ref 38–126)
ALT: 18 U/L (ref 17–63)
AST: 25 U/L (ref 15–41)
Anion gap: 5 (ref 5–15)
BILIRUBIN TOTAL: 3.1 mg/dL — AB (ref 0.3–1.2)
BUN: 10 mg/dL (ref 6–20)
CALCIUM: 9.1 mg/dL (ref 8.9–10.3)
CO2: 23 mmol/L (ref 22–32)
Chloride: 109 mmol/L (ref 101–111)
Creatinine, Ser: 0.74 mg/dL (ref 0.61–1.24)
GFR calc Af Amer: >60 mL/min (ref >60)
GFR calc non Af Amer: >60 mL/min (ref >60)
GLUCOSE: 113 mg/dL — AB (ref 65–99)
POTASSIUM: 4.3 mmol/L (ref 3.5–5.1)
Sodium: 137 mmol/L (ref 135–145)
TOTAL PROTEIN: 7.2 g/dL (ref 6.5–8.1)

## 2015-07-10 MED ORDER — MORPHINE SULFATE ER 30 MG PO TBCR
30.0000 mg | EXTENDED_RELEASE_TABLET | Freq: Two times a day (BID) | ORAL | Status: DC
  Administered 2015-07-10 – 2015-07-12 (×5): 30 mg via ORAL
  Filled 2015-07-10 (×5): qty 1

## 2015-07-10 MED ORDER — HYDROMORPHONE HCL 1 MG/ML IJ SOLN
1.0000 mg | INTRAMUSCULAR | Status: DC | PRN
  Administered 2015-07-10 – 2015-07-11 (×10): 1 mg via INTRAVENOUS
  Filled 2015-07-10 (×10): qty 1

## 2015-07-10 MED ORDER — SODIUM CHLORIDE 0.9% FLUSH
10.0000 mL | INTRAVENOUS | Status: DC | PRN
Start: 2015-07-10 — End: 2015-07-12

## 2015-07-10 MED ORDER — HYDROMORPHONE 1 MG/ML IV SOLN
INTRAVENOUS | Status: DC
  Administered 2015-07-10: 16.2 mg via INTRAVENOUS
  Administered 2015-07-10: 10.5 mg via INTRAVENOUS
  Administered 2015-07-10: 7.8 mg via INTRAVENOUS
  Administered 2015-07-10 (×2): via INTRAVENOUS
  Administered 2015-07-10: 0 mg via INTRAVENOUS
  Administered 2015-07-11: 8.4 mg via INTRAVENOUS
  Administered 2015-07-11: 12:00:00 via INTRAVENOUS
  Administered 2015-07-11: 7.8 mg via INTRAVENOUS
  Administered 2015-07-11: 9 mg via INTRAVENOUS
  Administered 2015-07-11: 7.2 mg via INTRAVENOUS
  Administered 2015-07-11: 4.39 mg via INTRAVENOUS
  Administered 2015-07-11: 10.8 mg via INTRAVENOUS
  Administered 2015-07-11: 22:00:00 via INTRAVENOUS
  Administered 2015-07-12: 3 mg via INTRAVENOUS
  Administered 2015-07-12: 15:00:00 via INTRAVENOUS
  Administered 2015-07-12: 0.6 mg via INTRAVENOUS
  Administered 2015-07-12: 13.2 mg via INTRAVENOUS
  Administered 2015-07-12: 10.8 mg via INTRAVENOUS
  Filled 2015-07-10 (×5): qty 25

## 2015-07-10 NOTE — Telephone Encounter (Signed)
Message will closed as BQ spoke with Dr. Karena Addison.

## 2015-07-10 NOTE — Care Management Important Message (Signed)
Important Message  Patient Details  Name: [name] MRN: [id number] Date of Birth: [birth year]   Medicare Important Message Given:  Yes    [name] 07/10/2015, 10:20 AMImportant Message  Patient Details  Name: [name] MRN: [id number] Date of Birth: Oct 12, 1980   Medicare Important Message Given:  Yes    [name] 07/10/2015, 10:20 AM

## 2015-07-10 NOTE — Progress Notes (Signed)
Subjective: Patient is doing much better in stepdown unit. His pain is still down to 7 out of 10 and his oxygen demand is down to 3 L/min. He denied shortness of breath or cough denied any nausea vomiting or diarrhea. Patient still has low-grade temperature but no evidence of infection. Marland Kitchen He is on the Dilaudid PCA and has used 48 mg with 89 demands and 89 deliveries. He also has physician assisted dosing at 2 mg every 3 hours.His hemoglobin continues to be stable after transfusion.  Objective: Vital signs in last 24 hours: Temp:  [98.2 F (36.8 C)-99 F (37.2 C)] 98.7 F (37.1 C) (06/09 0337) Pulse Rate:  [77-100] 77 (06/09 0619) Resp:  [13-26] 15 (06/09 0619) BP: (95-126)/(63-85) 99/67 mmHg (06/09 0619) SpO2:  [91 %-100 %] 100 % (06/09 0619) Weight change:  Last BM Date: 07/07/15  Intake/Output from previous day: 06/08 0701 - 06/09 0700 In: 920 [P.O.:220; I.V.:700] Out: 2125 [Urine:2125] Intake/Output this shift: Total I/O In: 50 [I.V.:50] Out: 1200 [Urine:1200]  General appearance: alert, cooperative, appears stated age and mild distress Head: Normocephalic, without obvious abnormality, atraumatic Neck: no adenopathy, no carotid bruit, no JVD, supple, symmetrical, trachea midline and thyroid not enlarged, symmetric, no tenderness/mass/nodules Back: symmetric, no curvature. ROM normal. No CVA tenderness. Resp: diminished breath sounds bilaterally, rales bilaterally and rhonchi bilaterally Chest wall: no tenderness Cardio: regular rate and rhythm, S1, S2 normal, no murmur, click, rub or gallop GI: soft, non-tender; bowel sounds normal; no masses,  no organomegaly Extremities: extremities normal, atraumatic, no cyanosis or edema Pulses: 2+ and symmetric Skin: Skin color, texture, turgor normal. No rashes or lesions Neurologic: Grossly normal  Lab Results:  Recent Labs  07/09/15 0310 07/10/15 0440  WBC 19.5* 17.6*  HGB 6.8* 6.7*  HCT 19.0* 18.4*  PLT 545* 524*    BMET  Recent Labs  07/09/15 0310 07/10/15 0440  NA 135 137  K 4.3 4.3  CL 106 109  CO2 23 23  GLUCOSE 120* 113*  BUN 8 10  CREATININE 0.67 0.74  CALCIUM 9.1 9.1    Studies/Results: Dg Chest 2 View  07/08/2015  CLINICAL DATA:  Shortness of breath,, hypoxia, history of sickle cell EXAM: CHEST  2 VIEW COMPARISON:  07/07/2015 FINDINGS: Cardiomegaly again noted. Left subclavian Port-A-Cath is unchanged in position. No acute infiltrate or pleural effusion. No pulmonary edema. IMPRESSION: No active cardiopulmonary disease. Electronically Signed   By: Lahoma Crocker M.D.   On: 07/08/2015 11:01   Dg Chest Port 1 View  07/09/2015  CLINICAL DATA:  Shortness of breath.  Sickle cell disease EXAM: PORTABLE CHEST 1 VIEW COMPARISON:  July 08, 2015 FINDINGS: Port-A-Cath tip is in the superior cava. No pneumothorax. There is no edema or consolidation. There is generalized cardiac enlargement. There is pulmonary venous hypertension. No adenopathy evident. No bone lesions. IMPRESSION: Cardiomegaly with pulmonary vascular congestion.  No consolidation. Electronically Signed   By: Lowella Grip III M.D.   On: 07/09/2015 08:04    Medications: I have reviewed the patient's current medications.  Assessment/Plan: 35 year old gentleman with sickle cell painful crisis and acute on chronic hypoxemia.  #1 hypoxia: Patient is improving after transfusion as well as care in the stepdown unit. Patient will be moved to the floor and continue current regimen Continue to titrate oxygen down to his 2 L/m #2 sickle cell painful crisis: I will decrease his PCA and start her long-acting MS Contin. I will change his physician assisted dosing to 1 mg every 3 hours when  necessary. Continue close monitoring. #3 anemia of chronic disease: Patient has received one unit of packed red blood cells and his hemoglobin has stabilized at 6.7. We'll continue to monitor #4 pulmonary hypertension: This is chronic and patient is currently  on medication and Oxygen. #5 Pulmonary Embolism: On xarelto. Will continue   LOS: 4 days   [name],[name] 07/10/2015, 7:00 AM

## 2015-07-11 DIAGNOSIS — J9621 Acute and chronic respiratory failure with hypoxia: Secondary | ICD-10-CM | POA: Diagnosis not present

## 2015-07-11 DIAGNOSIS — Z96641 Presence of right artificial hip joint: Secondary | ICD-10-CM | POA: Diagnosis not present

## 2015-07-11 DIAGNOSIS — I2699 Other pulmonary embolism without acute cor pulmonale: Secondary | ICD-10-CM | POA: Diagnosis not present

## 2015-07-11 DIAGNOSIS — D638 Anemia in other chronic diseases classified elsewhere: Secondary | ICD-10-CM | POA: Diagnosis not present

## 2015-07-11 DIAGNOSIS — R0902 Hypoxemia: Secondary | ICD-10-CM | POA: Diagnosis not present

## 2015-07-11 DIAGNOSIS — D5700 Hb-SS disease with crisis, unspecified: Secondary | ICD-10-CM | POA: Diagnosis not present

## 2015-07-11 DIAGNOSIS — E46 Unspecified protein-calorie malnutrition: Secondary | ICD-10-CM | POA: Diagnosis not present

## 2015-07-11 LAB — COMPREHENSIVE METABOLIC PANEL
ALBUMIN: 3.8 g/dL (ref 3.5–5.0)
ALT: 18 U/L (ref 17–63)
ANION GAP: 6 (ref 5–15)
AST: 27 U/L (ref 15–41)
Alkaline Phosphatase: 76 U/L (ref 38–126)
BILIRUBIN TOTAL: 3 mg/dL — AB (ref 0.3–1.2)
BUN: 12 mg/dL (ref 6–20)
CO2: 24 mmol/L (ref 22–32)
Calcium: 8.8 mg/dL — ABNORMAL LOW (ref 8.9–10.3)
Chloride: 108 mmol/L (ref 101–111)
Creatinine, Ser: 0.83 mg/dL (ref 0.61–1.24)
GFR calc Af Amer: >60 mL/min (ref >60)
GFR calc non Af Amer: >60 mL/min (ref >60)
GLUCOSE: 126 mg/dL — AB (ref 65–99)
POTASSIUM: 4.1 mmol/L (ref 3.5–5.1)
Sodium: 138 mmol/L (ref 135–145)
TOTAL PROTEIN: 7.4 g/dL (ref 6.5–8.1)

## 2015-07-11 LAB — CBC WITH DIFFERENTIAL/PLATELET
BLASTS: 0 %
Band Neutrophils: 1 %
Basophils Absolute: 0 10*3/uL (ref 0.0–0.1)
Basophils Relative: 0 %
EOS PCT: 4 %
Eosinophils Absolute: 0.6 10*3/uL (ref 0.0–0.7)
HEMATOCRIT: 19.6 % — AB (ref 39.0–52.0)
Hemoglobin: 6.9 g/dL — CL (ref 13.0–17.0)
LYMPHS ABS: 1.6 10*3/uL (ref 0.7–4.0)
Lymphocytes Relative: 11 %
MCH: 33.7 pg (ref 26.0–34.0)
MCHC: 35.2 g/dL (ref 30.0–36.0)
MCV: 95.6 fL (ref 78.0–100.0)
MONO ABS: 1.1 10*3/uL — AB (ref 0.1–1.0)
MYELOCYTES: 0 %
Metamyelocytes Relative: 1 %
Monocytes Relative: 8 %
NEUTROS ABS: 10.9 10*3/uL — AB (ref 1.7–7.7)
NEUTROS PCT: 75 %
NRBC: 0 /100{WBCs}
OTHER: 0 %
PROMYELOCYTES ABS: 0 %
Platelets: 551 10*3/uL — ABNORMAL HIGH (ref 150–400)
RBC: 2.05 MIL/uL — ABNORMAL LOW (ref 4.22–5.81)
RDW: 22.4 % — AB (ref 11.5–15.5)
WBC: 14.2 10*3/uL — AB (ref 4.0–10.5)

## 2015-07-11 MED ORDER — HYDROMORPHONE HCL 2 MG/ML IJ SOLN
2.0000 mg | INTRAMUSCULAR | Status: DC | PRN
  Administered 2015-07-11 – 2015-07-12 (×9): 2 mg via INTRAVENOUS
  Filled 2015-07-11 (×9): qty 1

## 2015-07-11 NOTE — Progress Notes (Signed)
Subjective: Patient is Doing better now on 3L/min oxygen. His pain is still down to 6 out of 10 He denied shortness of breath or cough denied any nausea vomiting or diarrhea. Patient still has low-grade temperature but no evidence of infection. Marland Kitchen He is on the Dilaudid PCA and has used 44 mg with 92c demands and 88 deliveries. He also has physician assisted dosing at 2 mg every 3 hours. His hemoglobin continues to be stable after transfusion.  Objective: Vital signs in last 24 hours: Temp:  [98.4 F (36.9 C)-99.4 F (37.4 C)] 99.4 F (37.4 C) (06/10 1044) Pulse Rate:  [81-91] 81 (06/10 1044) Resp:  [10-24] 10 (06/10 1603) BP: (100-111)/(65-78) 103/65 mmHg (06/10 1044) SpO2:  [90 %-97 %] 92 % (06/10 1603) FiO2 (%):  [26 %-28 %] 26 % (06/10 0415) Weight:  [71.1 kg (156 lb 12 oz)-72.1 kg (158 lb 15.2 oz)] 72.1 kg (158 lb 15.2 oz) (06/10 0521) Weight change:  Last BM Date: 07/07/15  Intake/Output from previous day: 06/09 0701 - 06/10 0700 In: [id number] [P.O.:835; I.V.:50] Out: 2475 [Urine:2475] Intake/Output this shift:    General appearance: alert, cooperative, appears stated age and mild distress Head: Normocephalic, without obvious abnormality, atraumatic Neck: no adenopathy, no carotid bruit, no JVD, supple, symmetrical, trachea midline and thyroid not enlarged, symmetric, no tenderness/mass/nodules Back: symmetric, no curvature. ROM normal. No CVA tenderness. Resp: diminished breath sounds bilaterally, rales bilaterally and rhonchi bilaterally Chest wall: no tenderness Cardio: regular rate and rhythm, S1, S2 normal, no murmur, click, rub or gallop GI: soft, non-tender; bowel sounds normal; no masses,  no organomegaly Extremities: extremities normal, atraumatic, no cyanosis or edema Pulses: 2+ and symmetric Skin: Skin color, texture, turgor normal. No rashes or lesions Neurologic: Grossly normal  Lab Results:  Recent Labs  07/10/15 0440 07/11/15 0405  WBC 17.6* 14.2*  HGB 6.7*  6.9*  HCT 18.4* 19.6*  PLT 524* 551*   BMET  Recent Labs  07/10/15 0440 07/11/15 0405  NA 137 138  K 4.3 4.1  CL 109 108  CO2 23 24  GLUCOSE 113* 126*  BUN 10 12  CREATININE 0.74 0.83  CALCIUM 9.1 8.8*    Studies/Results: No results found.  Medications: I have reviewed the patient's current medications.  Assessment/Plan: 35 year old gentleman with sickle cell painful crisis and acute on chronic hypoxemia.  #1 hypoxia: Patient is improving after transfusion as well as care in the stepdown unit. Patient will continue current regimen and  Continue to titrate oxygen down to his 2 L/m #2 sickle cell painful crisis: I will continue his PCA and continue his long-acting MS Contin. I will ccontinue his physician assisted dosing to 1 mg every 3 hours when necessary. Continue close monitoring. #3 anemia of chronic disease: Patient has received one unit of packed red blood cells and his hemoglobin has stabilized at 6.9. We'll continue to monitor #4 pulmonary hypertension: This is chronic and patient is currently on medication and Oxygen. #5 Pulmonary Embolism: On xarelto. Will continue   LOS: 5 days   [name],[name] 07/11/2015, 4:23 PM

## 2015-07-12 DIAGNOSIS — R0902 Hypoxemia: Secondary | ICD-10-CM | POA: Diagnosis not present

## 2015-07-12 DIAGNOSIS — E46 Unspecified protein-calorie malnutrition: Secondary | ICD-10-CM | POA: Diagnosis not present

## 2015-07-12 DIAGNOSIS — I2699 Other pulmonary embolism without acute cor pulmonale: Secondary | ICD-10-CM | POA: Diagnosis not present

## 2015-07-12 DIAGNOSIS — D638 Anemia in other chronic diseases classified elsewhere: Secondary | ICD-10-CM | POA: Diagnosis not present

## 2015-07-12 DIAGNOSIS — Z96641 Presence of right artificial hip joint: Secondary | ICD-10-CM | POA: Diagnosis not present

## 2015-07-12 DIAGNOSIS — D5700 Hb-SS disease with crisis, unspecified: Secondary | ICD-10-CM | POA: Diagnosis not present

## 2015-07-12 DIAGNOSIS — J9621 Acute and chronic respiratory failure with hypoxia: Secondary | ICD-10-CM | POA: Diagnosis not present

## 2015-07-12 MED ORDER — HEPARIN SOD (PORK) LOCK FLUSH 100 UNIT/ML IV SOLN
500.0000 [IU] | Freq: Once | INTRAVENOUS | Status: AC
  Administered 2015-07-12: 500 [IU] via INTRAVENOUS
  Filled 2015-07-12: qty 5

## 2015-07-12 NOTE — Discharge Summary (Signed)
Physician Discharge Summary  Patient ID: [name] MRN: [id number] DOB/AGE: December 03, 1980 35 y.o.  Admit date: 07/06/2015 Discharge date: 07/12/2015  Admission Diagnoses:  Discharge Diagnoses:  Principal Problem:   Hypoxia Active Problems:   Anemia of chronic disease   Hypoxemia   Sickle cell pain crisis ([place])   Pulmonary embolism without acute cor pulmonale ([place])   Discharged Condition: serious  Hospital Course: Patient was admitted with sickle cell painful crisis and significant Hypoxemia. He had drop in his Oxygen saturation down to 80% and required 10L/min oxygen on the floor. He was transferred to the [facility] and CCM consulted. Patient was transfused 1 unit PRBC to help with oxygen carrying capacity. He later improved and oxygen demand dropped to3L/min and he was transferred out of the ICU. He later required 5L/min today. He was assessed on 2L/min today but he dropped to 84% with motion. He required 4L instead. Patient decided to leave AMA rather than wait to have his oxygen demand titrated down further.  He was also treated for his sickle cell crisis with IV Dilaudid PCA and toradol. Later Ibuprofen. He has home medications which he will continue taking.  Consults: pulmonary/intensive care  Significant Diagnostic Studies: labs: Serial CBCs and CMPs checked. Hb dropped to 6.2 andwas transfused.  Treatments: IV hydration, antibiotics: azithromycin, analgesia: acetaminophen and Dilaudid and Blood transfusion.  Discharge Exam: Blood pressure 113/71, pulse 80, temperature 98 F (36.7 C), temperature source Oral, resp. rate 92, height 6' (1.829 m), weight 72.4 kg (159 lb 9.8 oz), SpO2 94 %. General appearance: alert, cooperative and no distress Head: Normocephalic, without obvious abnormality, atraumatic Neck: no adenopathy, no carotid bruit, no JVD, supple, symmetrical, trachea midline and thyroid not enlarged, symmetric, no tenderness/mass/nodules Back: symmetric, no  curvature. ROM normal. No CVA tenderness. Resp: clear to auscultation bilaterally Chest wall: no tenderness Cardio: regular rate and rhythm, S1, S2 normal, no murmur, click, rub or gallop GI: soft, non-tender; bowel sounds normal; no masses,  no organomegaly Extremities: extremities normal, atraumatic, no cyanosis or edema Pulses: 2+ and symmetric Skin: Skin color, texture, turgor normal. No rashes or lesions Neurologic: Grossly normal  Disposition: 01-Home or Self Care     Medication List    ASK your doctor about these medications        acetaminophen 325 MG tablet  Commonly known as:  TYLENOL  Take 650 mg by mouth every 6 (six) hours as needed for moderate pain.     aspirin 81 MG chewable tablet  Chew 1 tablet (81 mg total) by mouth daily.     folic acid 1 MG tablet  Commonly known as:  FOLVITE  Take 1 tablet (1 mg total) by mouth every morning.     gabapentin 300 MG capsule  Commonly known as:  NEURONTIN  Take 1 capsule (300 mg total) by mouth 3 (three) times daily.     HYDROmorphone 4 MG tablet  Commonly known as:  DILAUDID  Take 1 tablet (4 mg total) by mouth every 4 (four) hours as needed for severe pain.     hydroxyurea 500 MG capsule  Commonly known as:  HYDREA  Take 2 capsules (1,000 mg total) by mouth daily. May take with food to minimize GI side effects.     JADENU 360 MG Tabs  Generic drug:  Deferasirox  Take 1,080 mg by mouth daily.     metoprolol succinate 25 MG 24 hr tablet  Commonly known as:  TOPROL-XL  TK 1 T PO ONCE D  morphine 30 MG 12 hr tablet  Commonly known as:  MS CONTIN  Take 1 tablet (30 mg total) by mouth every 12 (twelve) hours.     potassium chloride SA 20 MEQ tablet  Commonly known as:  K-DUR,KLOR-CON  Take 1 tablet (20 mEq total) by mouth every morning.     rivaroxaban 20 MG Tabs tablet  Commonly known as:  XARELTO  Take 20 mg by mouth daily with supper.     Rivaroxaban 15 MG Tabs tablet  Commonly known as:  XARELTO   Take 15 mg by mouth 2 (two) times daily with a meal.     Vitamin D 2000 units tablet  Take 1 tablet (2,000 Units total) by mouth daily.     zolpidem 10 MG tablet  Commonly known as:  AMBIEN  Take 1 tablet (10 mg total) by mouth at bedtime as needed for sleep.           Follow-up Information    Follow up with Rexene Edison, NP On 07/23/2015.   Specialty:  Pulmonary Disease   Why:  Follow up with lung doctors at 2 pm   Contact information:   [address] [address] [phone]       Signed: Barbette Merino 07/12/2015, 4:17 PM  Time spent 33 minutes

## 2015-07-12 NOTE — Progress Notes (Signed)
Pt's O2  sats  has been 77% - 88%  With 4l of oxygyen ,Dr Jonelle Sidle was notified and orders received to let patient know that he will go home tomorrow but patient states he has court appointment on Monday and has to leave today. Patient signed against medical advice papers and left .Dr Jonelle Sidle , the charge nurse,AC were notified

## 2015-07-13 ENCOUNTER — Telehealth: Payer: Self-pay

## 2015-07-13 DIAGNOSIS — G4700 Insomnia, unspecified: Secondary | ICD-10-CM

## 2015-07-13 LAB — TYPE AND SCREEN
ABO/RH(D): O POS
ANTIBODY SCREEN: NEGATIVE
DAT, IGG: NEGATIVE
UNIT DIVISION: 0
Unit division: 0

## 2015-07-13 MED ORDER — ZOLPIDEM TARTRATE 10 MG PO TABS: 10.0000 mg | ORAL_TABLET | Freq: Every evening | ORAL | Status: DC | PRN

## 2015-07-13 NOTE — Telephone Encounter (Signed)
Pt is requesting a medication refill for his Ambien. Thanks!

## 2015-07-13 NOTE — Telephone Encounter (Signed)
Patient requesting a refill on ambien. LOV 06/09/2015. Please advise. Thanks!

## 2015-07-13 NOTE — Telephone Encounter (Signed)
Reviewed [place] Substance Reporting system prior to prescribing controlled medications, no inconsistencies noted.   Meds ordered this encounter  Medications  . zolpidem (AMBIEN) 10 MG tablet    Sig: Take 1 tablet (10 mg total) by mouth at bedtime as needed for sleep.    Dispense:  30 tablet    Refill:  0    Order Specific Question:  Supervising Provider    Answer:  Tresa Garter [id number]    Dorena Dew, FNP

## 2015-07-17 ENCOUNTER — Telehealth (HOSPITAL_COMMUNITY): Payer: Self-pay | Admitting: *Deleted

## 2015-07-17 ENCOUNTER — Encounter (HOSPITAL_COMMUNITY): Payer: Self-pay | Admitting: Emergency Medicine

## 2015-07-17 ENCOUNTER — Emergency Department (HOSPITAL_COMMUNITY): Payer: Medicare Other

## 2015-07-17 ENCOUNTER — Observation Stay (HOSPITAL_COMMUNITY)
Admission: EM | Admit: 2015-07-17 | Discharge: 2015-07-17 | Disposition: A | Discharge status: Home / Self Care | Payer: Medicare Other | Attending: Emergency Medicine | Admitting: Emergency Medicine

## 2015-07-17 DIAGNOSIS — D57219 Sickle-cell/Hb-C disease with crisis, unspecified: Secondary | ICD-10-CM | POA: Diagnosis not present

## 2015-07-17 DIAGNOSIS — J9611 Chronic respiratory failure with hypoxia: Secondary | ICD-10-CM | POA: Diagnosis not present

## 2015-07-17 DIAGNOSIS — Z96641 Presence of right artificial hip joint: Secondary | ICD-10-CM | POA: Diagnosis not present

## 2015-07-17 DIAGNOSIS — Z79899 Other long term (current) drug therapy: Secondary | ICD-10-CM | POA: Diagnosis not present

## 2015-07-17 DIAGNOSIS — Z79891 Long term (current) use of opiate analgesic: Secondary | ICD-10-CM | POA: Diagnosis not present

## 2015-07-17 DIAGNOSIS — Z7901 Long term (current) use of anticoagulants: Secondary | ICD-10-CM | POA: Insufficient documentation

## 2015-07-17 DIAGNOSIS — Z5321 Procedure and treatment not carried out due to patient leaving prior to being seen by health care provider: Secondary | ICD-10-CM | POA: Diagnosis not present

## 2015-07-17 DIAGNOSIS — D649 Anemia, unspecified: Secondary | ICD-10-CM | POA: Diagnosis present

## 2015-07-17 DIAGNOSIS — Z86711 Personal history of pulmonary embolism: Secondary | ICD-10-CM | POA: Insufficient documentation

## 2015-07-17 DIAGNOSIS — Z87891 Personal history of nicotine dependence: Secondary | ICD-10-CM | POA: Diagnosis not present

## 2015-07-17 DIAGNOSIS — D5700 Hb-SS disease with crisis, unspecified: Secondary | ICD-10-CM | POA: Diagnosis not present

## 2015-07-17 DIAGNOSIS — R0781 Pleurodynia: Secondary | ICD-10-CM | POA: Diagnosis present

## 2015-07-17 DIAGNOSIS — Z86718 Personal history of other venous thrombosis and embolism: Secondary | ICD-10-CM | POA: Insufficient documentation

## 2015-07-17 DIAGNOSIS — I10 Essential (primary) hypertension: Secondary | ICD-10-CM | POA: Insufficient documentation

## 2015-07-17 DIAGNOSIS — Z7982 Long term (current) use of aspirin: Secondary | ICD-10-CM | POA: Insufficient documentation

## 2015-07-17 DIAGNOSIS — R0602 Shortness of breath: Secondary | ICD-10-CM | POA: Diagnosis not present

## 2015-07-17 DIAGNOSIS — D539 Nutritional anemia, unspecified: Secondary | ICD-10-CM | POA: Diagnosis not present

## 2015-07-17 LAB — COMPREHENSIVE METABOLIC PANEL
ALT: 25 U/L (ref 17–63)
AST: 40 U/L (ref 15–41)
Albumin: 4 g/dL (ref 3.5–5.0)
Alkaline Phosphatase: 71 U/L (ref 38–126)
Anion gap: 6 (ref 5–15)
BUN: 12 mg/dL (ref 6–20)
CHLORIDE: 109 mmol/L (ref 101–111)
CO2: 24 mmol/L (ref 22–32)
Calcium: 9 mg/dL (ref 8.9–10.3)
Creatinine, Ser: 0.69 mg/dL (ref 0.61–1.24)
Glucose, Bld: 109 mg/dL — ABNORMAL HIGH (ref 65–99)
POTASSIUM: 4.4 mmol/L (ref 3.5–5.1)
SODIUM: 139 mmol/L (ref 135–145)
Total Bilirubin: 3.7 mg/dL — ABNORMAL HIGH (ref 0.3–1.2)
Total Protein: 7.7 g/dL (ref 6.5–8.1)

## 2015-07-17 LAB — CBC WITH DIFFERENTIAL/PLATELET
BASOS PCT: 1 %
Basophils Absolute: 0.1 10*3/uL (ref 0.0–0.1)
EOS PCT: 3 %
Eosinophils Absolute: 0.4 10*3/uL (ref 0.0–0.7)
HEMATOCRIT: 18.2 % — AB (ref 39.0–52.0)
HEMOGLOBIN: 6.2 g/dL — AB (ref 13.0–17.0)
LYMPHS PCT: 18 %
Lymphs Abs: 2.4 10*3/uL (ref 0.7–4.0)
MCH: 33.2 pg (ref 26.0–34.0)
MCHC: 34.1 g/dL (ref 30.0–36.0)
MCV: 97.3 fL (ref 78.0–100.0)
MONOS PCT: 11 %
Monocytes Absolute: 1.4 10*3/uL — ABNORMAL HIGH (ref 0.1–1.0)
NEUTROS PCT: 67 %
Neutro Abs: 8.8 10*3/uL — ABNORMAL HIGH (ref 1.7–7.7)
Platelets: 492 10*3/uL — ABNORMAL HIGH (ref 150–400)
RBC: 1.87 MIL/uL — AB (ref 4.22–5.81)
RDW: 22.4 % — AB (ref 11.5–15.5)
WBC: 13.1 10*3/uL — AB (ref 4.0–10.5)

## 2015-07-17 LAB — RETICULOCYTES
RBC.: 1.85 MIL/uL — AB (ref 4.22–5.81)
RETIC COUNT ABSOLUTE: 235 10*3/uL — AB (ref 19.0–186.0)
Retic Ct Pct: 12.7 % — ABNORMAL HIGH (ref 0.4–3.1)

## 2015-07-17 LAB — PREPARE RBC (CROSSMATCH)

## 2015-07-17 MED ORDER — ONDANSETRON HCL 4 MG/2ML IJ SOLN
4.0000 mg | INTRAMUSCULAR | Status: DC | PRN
  Administered 2015-07-17: 4 mg via INTRAVENOUS
  Filled 2015-07-17: qty 2

## 2015-07-17 MED ORDER — KETOROLAC TROMETHAMINE 30 MG/ML IJ SOLN
30.0000 mg | INTRAMUSCULAR | Status: AC
  Administered 2015-07-17: 30 mg via INTRAVENOUS
  Filled 2015-07-17: qty 1

## 2015-07-17 MED ORDER — HYDROMORPHONE HCL 2 MG/ML IJ SOLN: 0.0375 mg/kg | INTRAMUSCULAR | Status: AC

## 2015-07-17 MED ORDER — HYDROMORPHONE HCL 2 MG/ML IJ SOLN
0.0313 mg/kg | INTRAMUSCULAR | Status: AC
  Administered 2015-07-17: 2.3 mg via INTRAVENOUS
  Filled 2015-07-17: qty 2

## 2015-07-17 MED ORDER — HYDROMORPHONE HCL 2 MG/ML IJ SOLN
0.0250 mg/kg | INTRAMUSCULAR | Status: AC
  Administered 2015-07-17: 1.8 mg via INTRAVENOUS
  Filled 2015-07-17: qty 1

## 2015-07-17 MED ORDER — DEXTROSE-NACL 5-0.45 % IV SOLN
INTRAVENOUS | Status: DC
  Administered 2015-07-17: 15:00:00 via INTRAVENOUS

## 2015-07-17 MED ORDER — HEPARIN SOD (PORK) LOCK FLUSH 100 UNIT/ML IV SOLN
500.0000 [IU] | Freq: Once | INTRAVENOUS | Status: AC
  Administered 2015-07-17: 500 [IU]
  Filled 2015-07-17: qty 5

## 2015-07-17 MED ORDER — HYDROMORPHONE HCL 2 MG/ML IJ SOLN: 0.0250 mg/kg | INTRAMUSCULAR | Status: AC

## 2015-07-17 MED ORDER — HYDROMORPHONE HCL 2 MG/ML IJ SOLN
0.0375 mg/kg | INTRAMUSCULAR | Status: AC
  Administered 2015-07-17: 2.7 mg via INTRAVENOUS
  Filled 2015-07-17: qty 2

## 2015-07-17 MED ORDER — DIPHENHYDRAMINE HCL 50 MG/ML IJ SOLN
25.0000 mg | Freq: Once | INTRAMUSCULAR | Status: AC
  Administered 2015-07-17: 25 mg via INTRAVENOUS
  Filled 2015-07-17: qty 1

## 2015-07-17 MED ORDER — HYDROMORPHONE HCL 2 MG/ML IJ SOLN: 0.0313 mg/kg | INTRAMUSCULAR | Status: AC

## 2015-07-17 NOTE — ED Notes (Signed)
Pt reports bilateral ribs pain , shortness of breath only when he walks long distance, pt sts. Pt is on home O2 3 L. sts he called the clinic and was told it was too late to see him.

## 2015-07-17 NOTE — Telephone Encounter (Signed)
Patient called requesting to come to the [facility] for treatment of his pain. Stated his pain was in his sides and that it was a 8/10. Stated he had his last pain med of Dilaudid at 6 am and 9 am. Told patient that I would check with the provider and give him a call back. Pt voiced understanding.

## 2015-07-17 NOTE — ED Provider Notes (Signed)
CSN: [id number]     Arrival date & time 07/17/15  1322 History   First MD Initiated Contact with Patient 07/17/15 1424     Chief Complaint  Patient presents with  . Sickle Cell Pain Crisis    HPI   [name] is an 35 y.o. 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 He states he took his home hydromorphone and MS contin with no relief. States he called the sickle cell clinic and they told him it is too late to go over there and directed him to the emergency room. Pt states he had some shortness of breath earlier today but none now. Denies chest pain. States he is taking his xarelto as prescribed. He denies fever or chills. He states he does have an appointment at [facility] in three days for a blood exchange. He was admitted to [facility] last month for respiratory failure due to acute on chronic submassive PEs, likely due to subtherapeutic Xarelto.  He states does require 3L of O2 via [place] at baseline at home. He was also recently admitted here for hypoxia even with supplemental O2 but left AMA on 6/11.   Past Medical History  Diagnosis Date  . Sickle cell anemia (HCC)   . Blood transfusion   . Acute embolism and thrombosis of right internal jugular vein ([place])   . Hypokalemia   . Mood disorder ([place])   . History of pulmonary embolus (PE)   . Avascular necrosis (HCC)     Right Hip  . Leukocytosis     Chronic  . Thrombocytosis (HCC)     Chronic  . Hypertension   . History of Clostridium difficile infection   . Uses marijuana   . Chronic anticoagulation   . Functional asplenia   . Former smoker   . Second hand tobacco smoke exposure   . Alcohol consumption of one to four drinks per day   . Noncompliance with medication regimen   . Sickle-cell crisis with associated acute chest syndrome ([place]) 05/13/2013  . Acute  chest syndrome ([place]) 06/18/2013  . Demand ischemia ([place]) 01/02/2014  . Pulmonary hypertension ([place])   . Hb-SS disease with crisis ([place])   . Oxygen deficiency    Past Surgical History  Procedure Laterality Date  . Right hip replacement      08/2006  . Cholecystectomy      01/2008  . Porta cath placement    . Porta cath removal    . Umbilical hernia repair      01/2008  . Excision of left periauricular cyst      10/2009  . Excision of right ear lobe cyst with primary closur      11/2007  . Portacath placement  01/05/2012    Procedure: INSERTION PORT-A-CATH;  Surgeon: Odis Hollingshead, MD;  Location: [facility];  Service: General;  Laterality: N/A;  ultrasound guiced port a cath insertion with fluoroscopy   Family History  Problem Relation Age of Onset  . Sickle cell trait Mother   . Depression Mother   . Diabetes Mother   . Sickle cell trait Father   . Sickle cell trait Brother    Social History  Substance Use Topics  . Smoking status: Former Smoker -- 0.50 packs/day for 10 years    Types: Cigarettes  Quit date: 05/29/2011  . Smokeless tobacco: Never Used  . Alcohol Use: No    Review of Systems  All other systems reviewed and are negative.     Allergies  Review of patient's allergies indicates no known allergies.  Home Medications   Prior to Admission medications   Medication Sig Start Date End Date Taking? Authorizing Provider  acetaminophen (TYLENOL) 325 MG tablet Take 650 mg by mouth every 6 (six) hours as needed for moderate pain.    Historical Provider, MD  aspirin 81 MG chewable tablet Chew 1 tablet (81 mg total) by mouth daily. 07/23/14   [name], MD  Cholecalciferol (VITAMIN D) 2000 units tablet Take 1 tablet (2,000 Units total) by mouth daily. 02/23/15   Dorena Dew, FNP  Deferasirox (JADENU) 360 MG TABS Take 1,080 mg by mouth daily.    Historical Provider, MD  folic acid (FOLVITE) 1 MG tablet Take 1 tablet (1 mg total) by mouth every morning.  02/23/15   Dorena Dew, FNP  gabapentin (NEURONTIN) 300 MG capsule Take 1 capsule (300 mg total) by mouth 3 (three) times daily. 06/01/15   Tresa Garter, MD  HYDROmorphone (DILAUDID) 4 MG tablet Take 1 tablet (4 mg total) by mouth every 4 (four) hours as needed for severe pain. 07/07/15   Tresa Garter, MD  hydroxyurea (HYDREA) 500 MG capsule Take 2 capsules (1,000 mg total) by mouth daily. May take with food to minimize GI side effects. Patient taking differently: Take 1,500 mg by mouth daily. May take with food to minimize GI side effects. 06/01/15   Tresa Garter, MD  metoprolol succinate (TOPROL-XL) 25 MG 24 hr tablet TK 1 T PO ONCE D 07/03/15   Historical Provider, MD  morphine (MS CONTIN) 30 MG 12 hr tablet Take 1 tablet (30 mg total) by mouth every 12 (twelve) hours. 07/07/15   Tresa Garter, MD  potassium chloride SA (K-DUR,KLOR-CON) 20 MEQ tablet Take 1 tablet (20 mEq total) by mouth every morning. Patient taking differently: Take 20 mEq by mouth daily.  04/13/15   Dorena Dew, FNP  Rivaroxaban (XARELTO) 15 MG TABS tablet Take 15 mg by mouth 2 (two) times daily with a meal.  07/03/15   Historical Provider, MD  rivaroxaban (XARELTO) 20 MG TABS tablet Take 20 mg by mouth daily with supper.    Historical Provider, MD  zolpidem (AMBIEN) 10 MG tablet Take 1 tablet (10 mg total) by mouth at bedtime as needed for sleep. 07/13/15   Dorena Dew, FNP   BP 111/87 mmHg  Pulse 87  Temp(Src) 98.6 F (37 C) (Oral)  Resp 16  SpO2 92% Physical Exam  Constitutional: He is oriented to person, place, and time. Nasal cannula in place.  HENT:  Right Ear: External ear normal.  Left Ear: External ear normal.  Nose: Nose normal.  Mouth/Throat: Oropharynx is clear and moist. No oropharyngeal exudate.  Eyes: Conjunctivae and EOM are normal. Pupils are equal, round, and reactive to light.  Neck: Normal range of motion. Neck supple.  Cardiovascular: Normal rate, regular rhythm,  normal heart sounds and intact distal pulses.   Pulmonary/Chest: Effort normal and breath sounds normal. No respiratory distress. He has no wheezes. He exhibits tenderness.  Bilateral anterior chest wall ttp No increased WOB Lungs CTAB  Abdominal: Soft. Bowel sounds are normal. He exhibits no distension. There is no tenderness. There is no rebound and no guarding.  Musculoskeletal: He exhibits no edema.  Neurological:  He is alert and oriented to person, place, and time. No cranial nerve deficit.  Skin: Skin is warm and dry.  Psychiatric: He has a normal mood and affect.  Nursing note and vitals reviewed.   ED Course  Procedures (including critical care time) Labs Review Labs Reviewed  COMPREHENSIVE METABOLIC PANEL - Abnormal; Notable for the following:    Glucose, Bld 109 (*)    Total Bilirubin 3.7 (*)    All other components within normal limits  CBC WITH DIFFERENTIAL/PLATELET - Abnormal; Notable for the following:    WBC 13.1 (*)    RBC 1.87 (*)    Hemoglobin 6.2 (*)    HCT 18.2 (*)    RDW 22.4 (*)    Platelets 492 (*)    Neutro Abs 8.8 (*)    Monocytes Absolute 1.4 (*)    All other components within normal limits  RETICULOCYTES - Abnormal; Notable for the following:    Retic Ct Pct 12.7 (*)    RBC. 1.85 (*)    Retic Count, Manual 235.0 (*)    All other components within normal limits  PREPARE RBC (CROSSMATCH)  TYPE AND SCREEN    Imaging Review Dg Chest 2 View  07/17/2015  CLINICAL DATA:  Sickle cell crisis with pain and shortness of Breath EXAM: CHEST  2 VIEW COMPARISON:  07/09/2015 FINDINGS: Cardiac shadow is again enlarged. Left chest wall port is again seen and stable. No focal infiltrate or sizable effusion is noted. No acute bony abnormality is seen. IMPRESSION: No acute abnormality noted. Electronically Signed   By: Inez Catalina M.D.   On: 07/17/2015 14:50   I have personally reviewed and evaluated these images and lab results as part of my medical  decision-making.   EKG Interpretation None      MDM   Final diagnoses:  Anemia requiring transfusions  Sickle cell anemia with pain (HCC)  Chronic anemia  Chronic respiratory failure with hypoxia (HCC)    Will check CBC, CMP, and retic and r/o aplastic crisis. Given report of earlier SOB will obtain CXR. Pt's O2 requirement is at baseline as hypoxia improves with O2 via [place]. Will order weight-based dosing of pain meds per sickle cell order set.  Labs reveal hgb today is 6.2. Prior notes state that baseline is closer to 8, but Jerek states his baseline is closer to 7. SpO2 in low 90s on 3L via [place]. EMR review reveals that pulmonology transfused 1U PRBC when hgb 6.2 during last admission. Type and screen and 1U PRBC ordered. Will call hospitalist service for admission.  Spoke to Dr. Darrick Meigs who will admit pt to tele obs. Appreciate assistance.   Anne Ng, PA-C 07/17/15 1634  On hospitalist evaluation pt now wanting to leave AMA. He does not want to be admitted to the hospital. He does not want blood transfusion. He wants to follow up at [facility] next week. I also spoke with pt again and he understands the risks of leaving include disability or even death. He wishes to leave.  Anne Ng, PA-C 07/17/15 1652  Fredia Sorrow, MD 07/17/15 [phone]

## 2015-07-17 NOTE — Discharge Instructions (Signed)
Please follow up with [facility] and your primary care provider as soon as possible. Your hemoglobin today was 6.2.

## 2015-07-17 NOTE — Telephone Encounter (Signed)
After speaking with provider, C. Hollis, NP and called patient back that it was too late for him to come in to the center for adequate treatment. Pt advised to go to the emergency room if the pain med is not working. Pt voiced understanding.

## 2015-07-17 NOTE — ED Notes (Signed)
Lab delay - pt wants port accessed.

## 2015-07-17 NOTE — Progress Notes (Signed)
Pt with [facility] 13 ED visits and 24 admissions listed in the last 6 months  Pt with an upcoming appt on 07/23/15 at 2 pm with Velora Heckler NP Tammy Parrett No ED CP  Pcp listed as Dr Doreene Burke [name] candidate  Pt informing ED PA/NP he is leaving AMA at this time

## 2015-07-18 ENCOUNTER — Emergency Department (HOSPITAL_COMMUNITY)
Admission: EM | Admit: 2015-07-18 | Discharge: 2015-07-18 | Disposition: A | Discharge status: Other Acute Inpatient Hospital | Payer: Medicare Other | Attending: Emergency Medicine | Admitting: Emergency Medicine

## 2015-07-18 ENCOUNTER — Other Ambulatory Visit: Payer: Self-pay

## 2015-07-18 ENCOUNTER — Encounter (HOSPITAL_COMMUNITY): Payer: Self-pay | Admitting: *Deleted

## 2015-07-18 ENCOUNTER — Emergency Department (HOSPITAL_COMMUNITY): Payer: Medicare Other

## 2015-07-18 DIAGNOSIS — Z86711 Personal history of pulmonary embolism: Secondary | ICD-10-CM | POA: Insufficient documentation

## 2015-07-18 DIAGNOSIS — J9621 Acute and chronic respiratory failure with hypoxia: Secondary | ICD-10-CM | POA: Diagnosis present

## 2015-07-18 DIAGNOSIS — R0902 Hypoxemia: Secondary | ICD-10-CM | POA: Diagnosis not present

## 2015-07-18 DIAGNOSIS — R0789 Other chest pain: Secondary | ICD-10-CM | POA: Diagnosis not present

## 2015-07-18 DIAGNOSIS — Z7982 Long term (current) use of aspirin: Secondary | ICD-10-CM | POA: Diagnosis not present

## 2015-07-18 DIAGNOSIS — R0602 Shortness of breath: Secondary | ICD-10-CM | POA: Diagnosis not present

## 2015-07-18 DIAGNOSIS — D731 Hypersplenism: Secondary | ICD-10-CM | POA: Diagnosis present

## 2015-07-18 DIAGNOSIS — I279 Pulmonary heart disease, unspecified: Secondary | ICD-10-CM | POA: Diagnosis not present

## 2015-07-18 DIAGNOSIS — J9811 Atelectasis: Secondary | ICD-10-CM | POA: Diagnosis not present

## 2015-07-18 DIAGNOSIS — D571 Sickle-cell disease without crisis: Secondary | ICD-10-CM | POA: Diagnosis not present

## 2015-07-18 DIAGNOSIS — D5701 Hb-SS disease with acute chest syndrome: Secondary | ICD-10-CM | POA: Diagnosis present

## 2015-07-18 DIAGNOSIS — I5022 Chronic systolic (congestive) heart failure: Secondary | ICD-10-CM | POA: Diagnosis present

## 2015-07-18 DIAGNOSIS — I110 Hypertensive heart disease with heart failure: Secondary | ICD-10-CM | POA: Diagnosis present

## 2015-07-18 DIAGNOSIS — Z96641 Presence of right artificial hip joint: Secondary | ICD-10-CM | POA: Diagnosis present

## 2015-07-18 DIAGNOSIS — D5700 Hb-SS disease with crisis, unspecified: Secondary | ICD-10-CM

## 2015-07-18 DIAGNOSIS — F1290 Cannabis use, unspecified, uncomplicated: Secondary | ICD-10-CM | POA: Diagnosis not present

## 2015-07-18 DIAGNOSIS — I272 Other secondary pulmonary hypertension: Secondary | ICD-10-CM | POA: Diagnosis not present

## 2015-07-18 DIAGNOSIS — Z7901 Long term (current) use of anticoagulants: Secondary | ICD-10-CM | POA: Diagnosis not present

## 2015-07-18 DIAGNOSIS — I2699 Other pulmonary embolism without acute cor pulmonale: Secondary | ICD-10-CM | POA: Diagnosis not present

## 2015-07-18 DIAGNOSIS — I517 Cardiomegaly: Secondary | ICD-10-CM | POA: Diagnosis not present

## 2015-07-18 DIAGNOSIS — Z87891 Personal history of nicotine dependence: Secondary | ICD-10-CM | POA: Insufficient documentation

## 2015-07-18 DIAGNOSIS — Z79899 Other long term (current) drug therapy: Secondary | ICD-10-CM | POA: Insufficient documentation

## 2015-07-18 DIAGNOSIS — D649 Anemia, unspecified: Secondary | ICD-10-CM | POA: Diagnosis not present

## 2015-07-18 DIAGNOSIS — Z9981 Dependence on supplemental oxygen: Secondary | ICD-10-CM | POA: Diagnosis not present

## 2015-07-18 DIAGNOSIS — R918 Other nonspecific abnormal finding of lung field: Secondary | ICD-10-CM | POA: Diagnosis not present

## 2015-07-18 DIAGNOSIS — I2782 Chronic pulmonary embolism: Secondary | ICD-10-CM | POA: Diagnosis present

## 2015-07-18 LAB — COMPREHENSIVE METABOLIC PANEL
ALBUMIN: 3.8 g/dL (ref 3.5–5.0)
ALK PHOS: 75 U/L (ref 38–126)
ALT: 26 U/L (ref 17–63)
AST: 33 U/L (ref 15–41)
Anion gap: 5 (ref 5–15)
BUN: 18 mg/dL (ref 6–20)
CALCIUM: 8.8 mg/dL — AB (ref 8.9–10.3)
CHLORIDE: 109 mmol/L (ref 101–111)
CO2: 23 mmol/L (ref 22–32)
CREATININE: 1.06 mg/dL (ref 0.61–1.24)
GFR calc non Af Amer: >60 mL/min (ref >60)
GLUCOSE: 113 mg/dL — AB (ref 65–99)
Potassium: 4.8 mmol/L (ref 3.5–5.1)
Sodium: 137 mmol/L (ref 135–145)
Total Bilirubin: 3.3 mg/dL — ABNORMAL HIGH (ref 0.3–1.2)
Total Protein: 7.3 g/dL (ref 6.5–8.1)

## 2015-07-18 LAB — PROTIME-INR
INR: 2.51 — AB (ref 0.00–1.49)
PROTHROMBIN TIME: 26 s — AB (ref 11.6–15.2)

## 2015-07-18 LAB — URINALYSIS, ROUTINE W REFLEX MICROSCOPIC
BILIRUBIN URINE: NEGATIVE
GLUCOSE, UA: NEGATIVE mg/dL
Hgb urine dipstick: NEGATIVE
Ketones, ur: NEGATIVE mg/dL
LEUKOCYTES UA: NEGATIVE
NITRITE: NEGATIVE
PH: 6.5 (ref 5.0–8.0)
Protein, ur: NEGATIVE mg/dL
SPECIFIC GRAVITY, URINE: 1.013 (ref 1.005–1.030)

## 2015-07-18 LAB — APTT: aPTT: 71 seconds — ABNORMAL HIGH (ref 24–37)

## 2015-07-18 LAB — CBC WITH DIFFERENTIAL/PLATELET
BASOS ABS: 0.2 10*3/uL — AB (ref 0.0–0.1)
Basophils Relative: 1 %
EOS ABS: 0.5 10*3/uL (ref 0.0–0.7)
Eosinophils Relative: 3 %
HCT: 17.3 % — ABNORMAL LOW (ref 39.0–52.0)
HEMOGLOBIN: 5.8 g/dL — AB (ref 13.0–17.0)
LYMPHS PCT: 16 %
Lymphs Abs: 2.5 10*3/uL (ref 0.7–4.0)
MCH: 33.5 pg (ref 26.0–34.0)
MCHC: 33.5 g/dL (ref 30.0–36.0)
MCV: 100 fL (ref 78.0–100.0)
Monocytes Absolute: 2.1 10*3/uL — ABNORMAL HIGH (ref 0.1–1.0)
Monocytes Relative: 13 %
NEUTROS PCT: 67 %
Neutro Abs: 10.6 10*3/uL — ABNORMAL HIGH (ref 1.7–7.7)
Platelets: 506 10*3/uL — ABNORMAL HIGH (ref 150–400)
RBC: 1.73 MIL/uL — AB (ref 4.22–5.81)
RDW: 23.3 % — ABNORMAL HIGH (ref 11.5–15.5)
WBC: 15.9 10*3/uL — ABNORMAL HIGH (ref 4.0–10.5)

## 2015-07-18 LAB — RETICULOCYTES
RBC.: 1.73 MIL/uL — AB (ref 4.22–5.81)
RETIC CT PCT: 11.7 % — AB (ref 0.4–3.1)
Retic Count, Absolute: 202.4 10*3/uL — ABNORMAL HIGH (ref 19.0–186.0)

## 2015-07-18 LAB — I-STAT CG4 LACTIC ACID, ED: LACTIC ACID, VENOUS: 0.71 mmol/L (ref 0.5–2.0)

## 2015-07-18 MED ORDER — HYDROMORPHONE HCL 2 MG/ML IJ SOLN: 0.0250 mg/kg | INTRAMUSCULAR | Status: DC

## 2015-07-18 MED ORDER — HYDROMORPHONE HCL 2 MG/ML IJ SOLN
1.8000 mg | Freq: Once | INTRAMUSCULAR | Status: AC
  Administered 2015-07-18: 1.8 mg via INTRAVENOUS
  Filled 2015-07-18: qty 1

## 2015-07-18 MED ORDER — HYDROMORPHONE HCL 1 MG/ML IJ SOLN
1.0000 mg | Freq: Once | INTRAMUSCULAR | Status: AC
  Administered 2015-07-18: 1 mg via INTRAVENOUS
  Filled 2015-07-18: qty 1

## 2015-07-18 MED ORDER — HEPARIN BOLUS VIA INFUSION
2000.0000 [IU] | Freq: Once | INTRAVENOUS | Status: AC
  Administered 2015-07-18: 2000 [IU] via INTRAVENOUS
  Filled 2015-07-18: qty 2000

## 2015-07-18 MED ORDER — HEPARIN (PORCINE) IN NACL 100-0.45 UNIT/ML-% IJ SOLN
1200.0000 [IU]/h | Freq: Once | INTRAMUSCULAR | Status: AC
  Administered 2015-07-18: 1200 [IU]/h via INTRAVENOUS
  Filled 2015-07-18: qty 250

## 2015-07-18 MED ORDER — DIPHENHYDRAMINE HCL 50 MG/ML IJ SOLN
25.0000 mg | Freq: Once | INTRAMUSCULAR | Status: AC
  Administered 2015-07-18: 25 mg via INTRAVENOUS
  Filled 2015-07-18: qty 1

## 2015-07-18 MED ORDER — SODIUM CHLORIDE 0.45 % IV SOLN
Freq: Once | INTRAVENOUS | Status: AC
  Administered 2015-07-18: 16:00:00 via INTRAVENOUS

## 2015-07-18 MED ORDER — FENTANYL CITRATE (PF) 100 MCG/2ML IJ SOLN
100.0000 ug | INTRAMUSCULAR | Status: DC
  Administered 2015-07-18 (×3): 100 ug via INTRAVENOUS
  Filled 2015-07-18 (×3): qty 2

## 2015-07-18 MED ORDER — ONDANSETRON HCL 4 MG/2ML IJ SOLN: 4.0000 mg | INTRAMUSCULAR | Status: DC | PRN

## 2015-07-18 MED ORDER — HYDROMORPHONE HCL 2 MG/ML IJ SOLN
2.4000 mg | Freq: Once | INTRAMUSCULAR | Status: AC
  Administered 2015-07-18: 2.4 mg via INTRAVENOUS
  Filled 2015-07-18: qty 2

## 2015-07-18 NOTE — ED Provider Notes (Signed)
Dr Chip Boer from [facility] consulted via telephone. He accepts patient in transfer. Concern for pulmonary embolism versus acute chest syndrome. He will obtain imaging when patient arrives at [facility] he is requesting heparin intravenous bolus 2000 units and heparin intravenous drip at 1200 units per hour. Chest x-ray viewed by me. Results for orders placed or performed during the hospital encounter of 07/18/15  CBC WITH DIFFERENTIAL  Result Value Ref Range   WBC 15.9 (H) 4.0 - 10.5 K/uL   RBC 1.73 (L) 4.22 - 5.81 MIL/uL   Hemoglobin 5.8 (LL) 13.0 - 17.0 g/dL   HCT 17.3 (L) 39.0 - 52.0 %   MCV 100.0 78.0 - 100.0 fL   MCH 33.5 26.0 - 34.0 pg   MCHC 33.5 30.0 - 36.0 g/dL   RDW 23.3 (H) 11.5 - 15.5 %   Platelets 506 (H) 150 - 400 K/uL   Neutrophils Relative % 67 %   Lymphocytes Relative 16 %   Monocytes Relative 13 %   Eosinophils Relative 3 %   Basophils Relative 1 %   Neutro Abs 10.6 (H) 1.7 - 7.7 K/uL   Lymphs Abs 2.5 0.7 - 4.0 K/uL   Monocytes Absolute 2.1 (H) 0.1 - 1.0 K/uL   Eosinophils Absolute 0.5 0.0 - 0.7 K/uL   Basophils Absolute 0.2 (H) 0.0 - 0.1 K/uL   RBC Morphology SICKLE CELLS   Protime-INR  Result Value Ref Range   Prothrombin Time 26.0 (H) 11.6 - 15.2 seconds   INR 2.51 (H) 0.00 - 1.49  APTT  Result Value Ref Range   aPTT 71 (H) 24 - 37 seconds  Reticulocytes  Result Value Ref Range   Retic Ct Pct 11.7 (H) 0.4 - 3.1 %   RBC. 1.73 (L) 4.22 - 5.81 MIL/uL   Retic Count, Manual 202.4 (H) 19.0 - 186.0 K/uL  Comprehensive metabolic panel  Result Value Ref Range   Sodium 137 135 - 145 mmol/L   Potassium 4.8 3.5 - 5.1 mmol/L   Chloride 109 101 - 111 mmol/L   CO2 23 22 - 32 mmol/L   Glucose, Bld 113 (H) 65 - 99 mg/dL   BUN 18 6 - 20 mg/dL   Creatinine, Ser 1.06 0.61 - 1.24 mg/dL   Calcium 8.8 (L) 8.9 - 10.3 mg/dL   Total Protein 7.3 6.5 - 8.1 g/dL   Albumin 3.8 3.5 - 5.0 g/dL   AST 33 15 - 41 U/L   ALT 26 17 - 63 U/L   Alkaline Phosphatase 75 38  - 126 U/L   Total Bilirubin 3.3 (H) 0.3 - 1.2 mg/dL   GFR calc non Af Amer >60 >60 mL/min   GFR calc Af Amer >60 >60 mL/min   Anion gap 5 5 - 15  Urinalysis, Routine w reflex microscopic (not at [facility])  Result Value Ref Range   Color, Urine YELLOW YELLOW   APPearance CLEAR CLEAR   Specific Gravity, Urine 1.013 1.005 - 1.030   pH 6.5 5.0 - 8.0   Glucose, UA NEGATIVE NEGATIVE mg/dL   Hgb urine dipstick NEGATIVE NEGATIVE   Bilirubin Urine NEGATIVE NEGATIVE   Ketones, ur NEGATIVE NEGATIVE mg/dL   Protein, ur NEGATIVE NEGATIVE mg/dL   Nitrite NEGATIVE NEGATIVE   Leukocytes, UA NEGATIVE NEGATIVE  I-Stat CG4 Lactic Acid, ED (not at [facility])  Result Value Ref Range   Lactic Acid, Venous 0.71 0.5 - 2.0 mmol/L   *Note: Due to a large number of results and/or encounters for the requested  time period, some results have not been displayed. A complete set of results can be found in Results Review.   Dg Chest 2 View  07/18/2015  CLINICAL DATA:  Short of breath today. History of sickle cell disease. EXAM: CHEST  2 VIEW COMPARISON:  07/17/2015 FINDINGS: There is moderate enlargement of the cardiac silhouette, unchanged. No mediastinal or hilar masses or evidence of adenopathy. Mild right lung base scarring. There are prominent bronchovascular markings, but no evidence of pneumonia or pulmonary edema. No pleural effusion or pneumothorax. Left anterior chest wall Port-A-Cath is stable. Bony thorax is intact. No change from the prior study. IMPRESSION: No acute cardiopulmonary disease. Electronically Signed   By: Lajean Manes M.D.   On: 07/18/2015 16:08   Dg Chest 2 View  07/17/2015  CLINICAL DATA:  Sickle cell crisis with pain and shortness of Breath EXAM: CHEST  2 VIEW COMPARISON:  07/09/2015 FINDINGS: Cardiac shadow is again enlarged. Left chest wall port is again seen and stable. No focal infiltrate or sizable effusion is noted. No acute bony abnormality is seen. IMPRESSION: No acute abnormality noted.  Electronically Signed   By: Inez Catalina M.D.   On: 07/17/2015 14:50   Dg Chest 2 View  07/08/2015  CLINICAL DATA:  Shortness of breath,, hypoxia, history of sickle cell EXAM: CHEST  2 VIEW COMPARISON:  07/07/2015 FINDINGS: Cardiomegaly again noted. Left subclavian Port-A-Cath is unchanged in position. No acute infiltrate or pleural effusion. No pulmonary edema. IMPRESSION: No active cardiopulmonary disease. Electronically Signed   By: Lahoma Crocker M.D.   On: 07/08/2015 11:01   X-ray Chest Pa And Lateral  07/07/2015  CLINICAL DATA:  Shortness of breath, history of sickle cell EXAM: CHEST  2 VIEW COMPARISON:  07/06/2015 FINDINGS: Moderate cardiac silhouette enlargement stable. Minimal vascular congestion. No pleural effusion. No consolidation. IMPRESSION: Cardiac enlargement stable with development of very mild vascular congestion. Electronically Signed   By: Skipper Cliche M.D.   On: 07/07/2015 11:27   Dg Chest 2 View  07/06/2015  CLINICAL DATA:  Sickle cell crisis with chest pain and shortness of breath today. EXAM: CHEST  2 VIEW COMPARISON:  Chest x-ray dated 06/19/2015. FINDINGS: Cardiomegaly is stable. Left chest wall Port-A-Cath is stable in position with tip overlying the expected location of the cavoatrial junction. Pulmonary vasculature remains mildly prominent, without frank pulmonary edema. Suspect some degree of chronic pulmonary artery hypertension. No evidence of pneumonia. No pleural effusion or pneumothorax seen. Osseous structures about the chest are unremarkable. IMPRESSION: Stable cardiomegaly.  No acute findings. Electronically Signed   By: Franki Cabot M.D.   On: 07/06/2015 12:50   Dg Chest 2 View  06/19/2015  CLINICAL DATA:  Chest x-ray prior to be keep scan. Patient reports right-sided chest discomfort and nonproductive cough for 3 days. History of previous pulmonary embolism, former smoker, history of sickle cell anemia. EXAM: CHEST  2 VIEW COMPARISON:  PA and lateral chest x-ray of  Jun 09, 2015 FINDINGS: The lungs are well-expanded. There is no focal infiltrate. The cardiac silhouette remains enlarged. The pulmonary vascularity is mildly prominent. There is no pleural effusion or pneumothorax. The mediastinum is normal in width. The power port appliance catheter tip projects over the midportion of the SVC. There is mild degenerative disc disease of the thoracic spine. IMPRESSION: COPD/ reactive airway disease. Stable cardiomegaly with mild central pulmonary vascular congestion. No evidence of pneumonia. Electronically Signed   By: David  Martinique M.D.   On: 06/19/2015 10:07   Nm Pulmonary  Per & Vent  06/19/2015  CLINICAL DATA:  Shortness of breath for 1 month. History of sickle cell disease EXAM: NUCLEAR MEDICINE VENTILATION - PERFUSION LUNG SCAN Views: Anterior, posterior, left lateral, right lateral, RAO, RAO, LAO, LPO -ventilation and perfusion RADIOPHARMACEUTICALS:  33.0 mCi Technetium-40m DTPA aerosol inhalation and 4.3 mCi Technetium-53m MAA IV COMPARISON:  Chest radiograph Jun 19, 2015 FINDINGS: Ventilation: Radiotracer uptake is homogeneous and symmetric bilaterally. Cardiomegaly is noted. Perfusion: There is no segmental perfusion defect. There are a few rather equivocal subsegmental areas of decreased perfusion without significant ventilation/perfusion mismatch. Cardiomegaly is noted. IMPRESSION: Cardiomegaly noted. No significant ventilation/ perfusion mismatch. This study constitutes an overall low probability of pulmonary embolus. Electronically Signed   By: Lowella Grip III M.D.   On: 06/19/2015 11:05   Dg Chest Port 1 View  07/09/2015  CLINICAL DATA:  Shortness of breath.  Sickle cell disease EXAM: PORTABLE CHEST 1 VIEW COMPARISON:  July 08, 2015 FINDINGS: Port-A-Cath tip is in the superior cava. No pneumothorax. There is no edema or consolidation. There is generalized cardiac enlargement. There is pulmonary venous hypertension. No adenopathy evident. No bone lesions.  IMPRESSION: Cardiomegaly with pulmonary vascular congestion.  No consolidation. Electronically Signed   By: Lowella Grip III M.D.   On: 07/09/2015 08:04     Orlie Dakin, MD 07/18/15 [phone]

## 2015-07-18 NOTE — ED Notes (Addendum)
Pt complains of bilateral rib pain, SOB upon exertion for the past 2 days. Pt was seen for same yesterday and was to be admitted to receive blood transfusion but left AMA. Pt states it feels like he is having sickle cell pain crisis.

## 2015-07-18 NOTE — ED Notes (Signed)
Pt has been accepted to [facility] [address]  Dr. Waynard [name] is the accepting doctor  [phone] is the phone number to call report. Please send a medical record and any imaging that has been done here copied to a disc.  If we need help transferring pt to [facility] can not take pt) [phone]

## 2015-07-18 NOTE — ED Provider Notes (Signed)
Complains of bilateral rib pain for the past 2 days typical of his sickle cell crisis. He denies any fever he does admit to shortness of breath. No other associated symptoms. Patient has elective exchange transfusion schedule at [facility] for 07/20/2015. Patient noted to be hypoxic on exam alert no distress lungs clear auscultation heart regular rhythm abdomen nondistended nontender all 4 extremities without redness swelling or tenderness neurovascularly intact. ED ECG REPORT   Date: 07/18/2015  Rate: 80  Rhythm: normal sinus rhythm  QRS Axis: left  Intervals: normal  ST/T Wave abnormalities: normal  Conduction Disutrbances:nonspecific intraventricular conduction delay  Narrative Interpretation:   Old EKG Reviewed: unchanged No significant change from 06/02/2015 I have personally reviewed the EKG tracing and agree with the computerized printout as noted.  Orlie Dakin, MD 07/18/15 1739

## 2015-07-18 NOTE — ED Notes (Signed)
Myriam Jacobson, dispatcher from [facility] system has called back to let me know earliest time pt will be picked up is 0200hrs

## 2015-07-18 NOTE — ED Provider Notes (Signed)
CSN: [id number]     Arrival date & time 07/18/15  1431 History   None    Chief Complaint  Patient presents with  . Sickle Cell Pain Crisis     (Consider location/radiation/quality/duration/timing/severity/associated sxs/prior Treatment) HPI [name] This 35 year old male with a past medical history of sickle cell anemia, history of multiple blood transfusions, avascular necrosis, chronic pancytopenia, chronic anemia, noncompliance with medication regimen, daily marijuana and alcohol abuse, history of acute chest syndrome, pulmonary hypertension, demand ischemia, chronic hypoxia on 4-6 L via nasal cannula at home who presents emergency Department with chief complaint of rib pain. The patient was seen yesterday for the same complaint and left AGAINST MEDICAL ADVICE. She is asked to stay for a blood transfusion. Patient returned today because he was unable to control his with pain at home. He complains of bilateral rib pain, nothing seems to make it worse or better. He states this is where he normally has his sickle cell pain crises. He has been feeling more tired and dyspneic with exertion, but denies any acute changes in his breathing. He denies fevers, chills. He has had associated myalgias over the past several days. The patient states that he is scheduled for a blood transfusion and exchange at [facility] on Monday, 07/20/2015 and does not want to miss his appointment. He is under the care of Dr. Doreene Burke here and followed by Dr. Brigitte Pulse at [facility] in the hematology Department. Patient states that about a month ago he was diagnosed with a pulmonary embolus and is currently on Xarelto. He does not have any melena, hematochezia, MI, history of peptic ulcer disease or GI bleed and does not use NSAIDs. Past Medical History  Diagnosis Date  . Sickle cell anemia (HCC)   . Blood transfusion   . Acute embolism and thrombosis of right internal jugular vein ([place])   . Hypokalemia   . Mood  disorder ([place])   . History of pulmonary embolus (PE)   . Avascular necrosis (HCC)     Right Hip  . Leukocytosis     Chronic  . Thrombocytosis (HCC)     Chronic  . Hypertension   . History of Clostridium difficile infection   . Uses marijuana   . Chronic anticoagulation   . Functional asplenia   . Former smoker   . Second hand tobacco smoke exposure   . Alcohol consumption of one to four drinks per day   . Noncompliance with medication regimen   . Sickle-cell crisis with associated acute chest syndrome ([place]) 05/13/2013  . Acute chest syndrome ([place]) 06/18/2013  . Demand ischemia ([place]) 01/02/2014  . Pulmonary hypertension ([place])   . Hb-SS disease with crisis ([place])   . Oxygen deficiency    Past Surgical History  Procedure Laterality Date  . Right hip replacement      08/2006  . Cholecystectomy      01/2008  . Porta cath placement    . Porta cath removal    . Umbilical hernia repair      01/2008  . Excision of left periauricular cyst      10/2009  . Excision of right ear lobe cyst with primary closur      11/2007  . Portacath placement  01/05/2012    Procedure: INSERTION PORT-A-CATH;  Surgeon: Odis Hollingshead, MD;  Location: [place];  Service: General;  Laterality: N/A;  ultrasound guiced port a cath insertion with fluoroscopy   Family History  Problem Relation Age of Onset  .  Sickle cell trait Mother   . Depression Mother   . Diabetes Mother   . Sickle cell trait Father   . Sickle cell trait Brother    Social History  Substance Use Topics  . Smoking status: Former Smoker -- 0.50 packs/day for 10 years    Types: Cigarettes    Quit date: 05/29/2011  . Smokeless tobacco: Never Used  . Alcohol Use: No    Review of Systems  Ten systems reviewed and are negative for acute change, except as noted in the HPI.    Allergies  Review of patient's allergies indicates no known allergies.  Home Medications   Prior to Admission medications   Medication Sig Start Date End  Date Taking? Authorizing Provider  aspirin 81 MG chewable tablet Chew 1 tablet (81 mg total) by mouth daily. 07/23/14  Yes [name], MD  Cholecalciferol (VITAMIN D) 2000 units tablet Take 1 tablet (2,000 Units total) by mouth daily. 02/23/15  Yes Dorena Dew, FNP  Deferasirox (JADENU) 360 MG TABS Take 1,080 mg by mouth 2 (two) times daily.    Yes Historical Provider, MD  folic acid (FOLVITE) 1 MG tablet Take 1 tablet (1 mg total) by mouth every morning. 02/23/15  Yes Dorena Dew, FNP  gabapentin (NEURONTIN) 300 MG capsule Take 1 capsule (300 mg total) by mouth 3 (three) times daily. 06/01/15  Yes Tresa Garter, MD  HYDROmorphone (DILAUDID) 4 MG tablet Take 1 tablet (4 mg total) by mouth every 4 (four) hours as needed for severe pain. 07/07/15  Yes Tresa Garter, MD  hydroxyurea (HYDREA) 500 MG capsule Take 2 capsules (1,000 mg total) by mouth daily. May take with food to minimize GI side effects. Patient taking differently: Take 1,500 mg by mouth daily. May take with food to minimize GI side effects. 06/01/15  Yes Tresa Garter, MD  morphine (MS CONTIN) 30 MG 12 hr tablet Take 1 tablet (30 mg total) by mouth every 12 (twelve) hours. 07/07/15  Yes Tresa Garter, MD  potassium chloride SA (K-DUR,KLOR-CON) 20 MEQ tablet Take 1 tablet (20 mEq total) by mouth every morning. Patient taking differently: Take 20 mEq by mouth daily.  04/13/15  Yes Dorena Dew, FNP  XARELTO 15 MG TABS tablet TK 1 T PO BID WITH MEALS 07/04/15  Yes Historical Provider, MD  zolpidem (AMBIEN) 10 MG tablet Take 1 tablet (10 mg total) by mouth at bedtime as needed for sleep. 07/13/15  Yes Dorena Dew, FNP  acetaminophen (TYLENOL) 325 MG tablet Take 650 mg by mouth every 6 (six) hours as needed for moderate pain.    Historical Provider, MD   BP 105/77 mmHg  Pulse 68  Temp(Src) 99.2 F (37.3 C) (Oral)  Resp 17  SpO2 94% Physical Exam  Constitutional: He appears well-developed and  well-nourished. No distress.  HENT:  Head: Normocephalic and atraumatic.  Eyes: Conjunctivae are normal. Scleral icterus (mild icterus) is present.  Neck: Normal range of motion. Neck supple.  Cardiovascular: Normal rate, regular rhythm and normal heart sounds.   Pulmonary/Chest: Effort normal and breath sounds normal. No respiratory distress. He exhibits no tenderness (chest is nontender to palpation).  On 15 L via an NRB , SP02 at 100%  Abdominal: Soft. There is no tenderness.  Musculoskeletal: He exhibits no edema.  Neurological: He is alert.  Skin: Skin is warm and dry. He is not diaphoretic.  Psychiatric: His behavior is normal.  Nursing note and vitals reviewed.  ED Course  .Critical Care Performed by: Margarita Mail Authorized by: Margarita Mail Total critical care time: 60 minutes Critical care time was exclusive of separately billable procedures and treating other patients. Critical care was necessary to treat or prevent imminent or life-threatening deterioration of the following conditions: respiratory failure. Critical care was time spent personally by me on the following activities: development of treatment plan with patient or surrogate, discussions with consultants, interpretation of cardiac output measurements, evaluation of patient's response to treatment, examination of patient, obtaining history from patient or surrogate, ordering and performing treatments and interventions, ordering and review of laboratory studies, ordering and review of radiographic studies, pulse oximetry, re-evaluation of patient's condition, review of old charts and transcutaneous pacing.   (including critical care time) Labs Review Labs Reviewed  CBC WITH DIFFERENTIAL/PLATELET - Abnormal; Notable for the following:    WBC 15.9 (*)    RBC 1.73 (*)    Hemoglobin 5.8 (*)    HCT 17.3 (*)    RDW 23.3 (*)    Platelets 506 (*)    Neutro Abs 10.6 (*)    Monocytes Absolute 2.1 (*)    Basophils  Absolute 0.2 (*)    All other components within normal limits  PROTIME-INR - Abnormal; Notable for the following:    Prothrombin Time 26.0 (*)    INR 2.51 (*)    All other components within normal limits  APTT - Abnormal; Notable for the following:    aPTT 71 (*)    All other components within normal limits  RETICULOCYTES - Abnormal; Notable for the following:    Retic Ct Pct 11.7 (*)    RBC. 1.73 (*)    Retic Count, Manual 202.4 (*)    All other components within normal limits  COMPREHENSIVE METABOLIC PANEL - Abnormal; Notable for the following:    Glucose, Bld 113 (*)    Calcium 8.8 (*)    Total Bilirubin 3.3 (*)    All other components within normal limits  URINALYSIS, ROUTINE W REFLEX MICROSCOPIC (NOT AT [facility])  I-STAT CG4 LACTIC ACID, ED    Imaging Review Dg Chest 2 View  07/18/2015  CLINICAL DATA:  Short of breath today. History of sickle cell disease. EXAM: CHEST  2 VIEW COMPARISON:  07/17/2015 FINDINGS: There is moderate enlargement of the cardiac silhouette, unchanged. No mediastinal or hilar masses or evidence of adenopathy. Mild right lung base scarring. There are prominent bronchovascular markings, but no evidence of pneumonia or pulmonary edema. No pleural effusion or pneumothorax. Left anterior chest wall Port-A-Cath is stable. Bony thorax is intact. No change from the prior study. IMPRESSION: No acute cardiopulmonary disease. Electronically Signed   By: Lajean Manes M.D.   On: 07/18/2015 16:08   Dg Chest 2 View  07/17/2015  CLINICAL DATA:  Sickle cell crisis with pain and shortness of Breath EXAM: CHEST  2 VIEW COMPARISON:  07/09/2015 FINDINGS: Cardiac shadow is again enlarged. Left chest wall port is again seen and stable. No focal infiltrate or sizable effusion is noted. No acute bony abnormality is seen. IMPRESSION: No acute abnormality noted. Electronically Signed   By: Inez Catalina M.D.   On: 07/17/2015 14:50   I have personally reviewed and evaluated these images  and lab results as part of my medical decision-making.   EKG Interpretation None      MDM   Final diagnoses:  Hypoxia  Hb-SS disease with crisis ([place])    10:27 PM BP 105/77 mmHg  Pulse 68  Temp(Src)  99.2 F (37.3 C) (Oral)  Resp 17  SpO2 94% Patient with lower than normal, but pressure. He is usually in the low 100s, but is below [id number] systolically today. Oral temperature at 99.4, and we'll obtain a rectal temperature. Yesterday's hemoglobin appeared to be at baseline, which is about 6. Patient however appears to be more hypoxic than normal with oxygen saturations at 82% on 6 L via nasal cannula. Patient was switched to NRB in 100% on 15 L. We'll evaluate for potential sepsis, acute chest syndrome, no neurologic deficits.. Blood  Cultures are drawn.   10:27 PM Patient with Increased oxygen demand, no signs of sepsis. Slightly lower hemoglobin than baseline. Urine is negative. His PT and INR is elevated on Plavix. His creatinine is on remarkable and within normal limits. White blood cell count slightly elevated from previous his reticulocyte count and bilirubin are at baseline. Patient's pain poorly controlled however his systolic pressure seems to be able to tolerate Dilaudid several increase the dose. Patient seen in shared visit with attending physician. The patient's oxygen saturations are now above 90% on a Venturi mask at 55% oxygen 14 L/m. The patient has been accepted for admission at [facility] where he will get his exchange transfusion on Monday. We will not get imaging for PE. Review of outside records shows the patient was admitted at [facility] on 520 03/22/1738: Pulmonary emboli after an initial diagnosis of a large submassive PE with pulmonary infarction during the previous month. There was a thought that he was probably noncompliant with his medications and there is some concern for a week or chronic pulmonary emboli today as well. Patient will be given in bolus  of half the normal dose of heparin and infusion at 1200 units per hour. He has been accepted for admission and appears safe for transfer.  Margarita Mail, PA-C 07/18/15 2227  Orlie Dakin, MD 07/19/15 [phone]

## 2015-07-18 NOTE — ED Notes (Signed)
Called [facility] to check on status for transfer/transport of pt to [facility], spoke to the dispatcher Suanne Marker who notified me that their transports units are backed up and we may be looking at a couple of hour till pt can be transferred.   I called [facility] at [phone] to enquire if they have transport units that would be able to come get the patient, spoke to [place] who promised to call me back letting me if and when they would be able to come get the pt.

## 2015-07-19 DIAGNOSIS — D5701 Hb-SS disease with acute chest syndrome: Secondary | ICD-10-CM | POA: Diagnosis not present

## 2015-07-19 DIAGNOSIS — J9621 Acute and chronic respiratory failure with hypoxia: Secondary | ICD-10-CM | POA: Diagnosis not present

## 2015-07-19 DIAGNOSIS — I279 Pulmonary heart disease, unspecified: Secondary | ICD-10-CM | POA: Diagnosis not present

## 2015-07-19 DIAGNOSIS — J9811 Atelectasis: Secondary | ICD-10-CM | POA: Diagnosis not present

## 2015-07-19 DIAGNOSIS — I2782 Chronic pulmonary embolism: Secondary | ICD-10-CM | POA: Diagnosis not present

## 2015-07-19 DIAGNOSIS — I2699 Other pulmonary embolism without acute cor pulmonale: Secondary | ICD-10-CM | POA: Diagnosis not present

## 2015-07-19 DIAGNOSIS — D5700 Hb-SS disease with crisis, unspecified: Secondary | ICD-10-CM | POA: Diagnosis not present

## 2015-07-19 DIAGNOSIS — I110 Hypertensive heart disease with heart failure: Secondary | ICD-10-CM | POA: Diagnosis not present

## 2015-07-19 DIAGNOSIS — Z7901 Long term (current) use of anticoagulants: Secondary | ICD-10-CM | POA: Diagnosis not present

## 2015-07-19 DIAGNOSIS — I5022 Chronic systolic (congestive) heart failure: Secondary | ICD-10-CM | POA: Diagnosis not present

## 2015-07-20 DIAGNOSIS — I2782 Chronic pulmonary embolism: Secondary | ICD-10-CM | POA: Diagnosis not present

## 2015-07-20 DIAGNOSIS — I517 Cardiomegaly: Secondary | ICD-10-CM | POA: Diagnosis not present

## 2015-07-20 DIAGNOSIS — J9621 Acute and chronic respiratory failure with hypoxia: Secondary | ICD-10-CM | POA: Diagnosis not present

## 2015-07-20 DIAGNOSIS — I2699 Other pulmonary embolism without acute cor pulmonale: Secondary | ICD-10-CM | POA: Diagnosis not present

## 2015-07-20 DIAGNOSIS — Z7901 Long term (current) use of anticoagulants: Secondary | ICD-10-CM | POA: Diagnosis not present

## 2015-07-20 DIAGNOSIS — I279 Pulmonary heart disease, unspecified: Secondary | ICD-10-CM | POA: Diagnosis not present

## 2015-07-20 DIAGNOSIS — I5022 Chronic systolic (congestive) heart failure: Secondary | ICD-10-CM | POA: Diagnosis not present

## 2015-07-20 DIAGNOSIS — D5700 Hb-SS disease with crisis, unspecified: Secondary | ICD-10-CM | POA: Diagnosis not present

## 2015-07-20 DIAGNOSIS — D5701 Hb-SS disease with acute chest syndrome: Secondary | ICD-10-CM | POA: Diagnosis not present

## 2015-07-20 DIAGNOSIS — I110 Hypertensive heart disease with heart failure: Secondary | ICD-10-CM | POA: Diagnosis not present

## 2015-07-21 DIAGNOSIS — I2782 Chronic pulmonary embolism: Secondary | ICD-10-CM | POA: Diagnosis not present

## 2015-07-21 DIAGNOSIS — R918 Other nonspecific abnormal finding of lung field: Secondary | ICD-10-CM | POA: Diagnosis not present

## 2015-07-21 DIAGNOSIS — D5700 Hb-SS disease with crisis, unspecified: Secondary | ICD-10-CM | POA: Diagnosis not present

## 2015-07-21 DIAGNOSIS — Z7901 Long term (current) use of anticoagulants: Secondary | ICD-10-CM | POA: Diagnosis not present

## 2015-07-21 DIAGNOSIS — D5701 Hb-SS disease with acute chest syndrome: Secondary | ICD-10-CM | POA: Diagnosis not present

## 2015-07-21 DIAGNOSIS — I279 Pulmonary heart disease, unspecified: Secondary | ICD-10-CM | POA: Diagnosis not present

## 2015-07-21 DIAGNOSIS — I110 Hypertensive heart disease with heart failure: Secondary | ICD-10-CM | POA: Diagnosis not present

## 2015-07-21 DIAGNOSIS — J9621 Acute and chronic respiratory failure with hypoxia: Secondary | ICD-10-CM | POA: Diagnosis not present

## 2015-07-21 DIAGNOSIS — I2699 Other pulmonary embolism without acute cor pulmonale: Secondary | ICD-10-CM | POA: Diagnosis not present

## 2015-07-21 DIAGNOSIS — I5022 Chronic systolic (congestive) heart failure: Secondary | ICD-10-CM | POA: Diagnosis not present

## 2015-07-21 LAB — TYPE AND SCREEN
ABO/RH(D): O POS
Antibody Screen: NEGATIVE
Unit division: 0

## 2015-07-21 IMAGING — CT CT ANGIO CHEST
1 of 2 series · 19 of 32 positions shown · IV contrast (omnipaque)
Comparison: Chest radiograph August 18, 2012 and chest CT angiogram
March 01, 2012

***ADDENDUM*** CREATED: 08/27/2012 [DATE]

Comment:  I communicated this report directly by phone to Dr.
Yadav at [facility], August 27, 2012, [DATE] p.m.
***END ADDENDUM*** SIGNED BY: Ingunn Harpa Ronlor, M.D.
CLINICAL DATA: Shortness of breath
CT ANGIOGRAPHY CHEST
TECHNIQUE: Multidetector CT imaging of the chest using the
standard protocol during bolus administration of intravenous
contrast. Multiplanar reconstructed images including MIPs were
obtained and reviewed to evaluate the vascular anatomy.
Contrast: 100mL OMNIPAQUE IOHEXOL 350 MG/ML SOLN

[Series 6: thins · axial · 0.73mm/px · z∈[-246,+8]mm · 19 of 283 slices shown]
[im 15/283  lung]
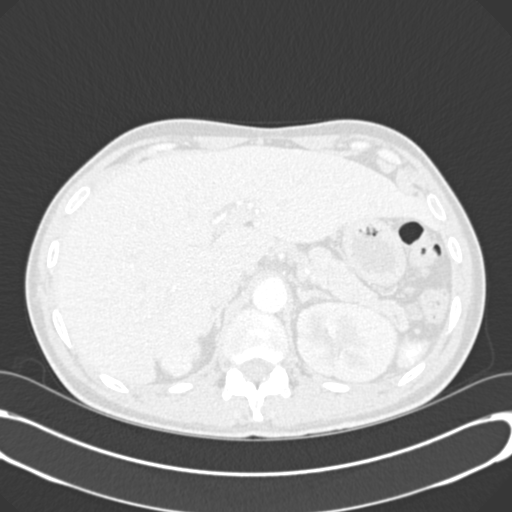
[im 29/283  mediastinal]
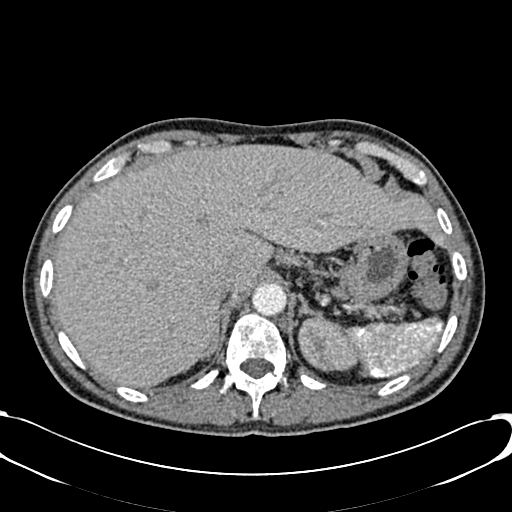
[im 43/283  lung]
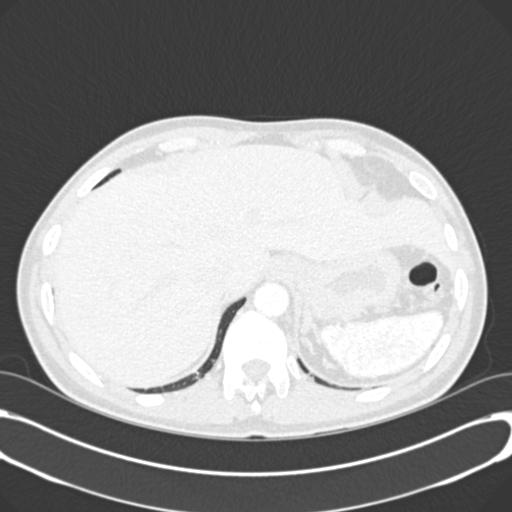
[im 71/283  mediastinal]
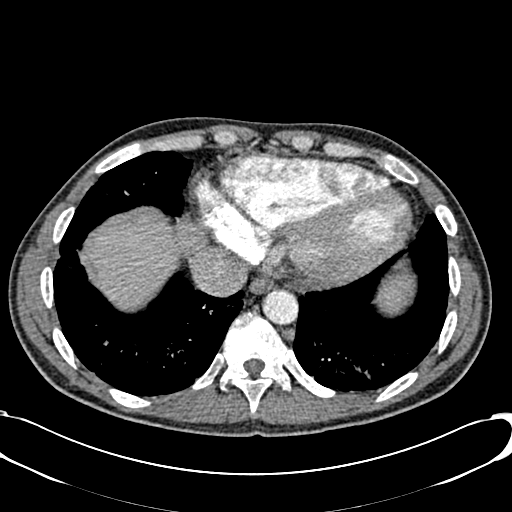
[im 85/283  lung]
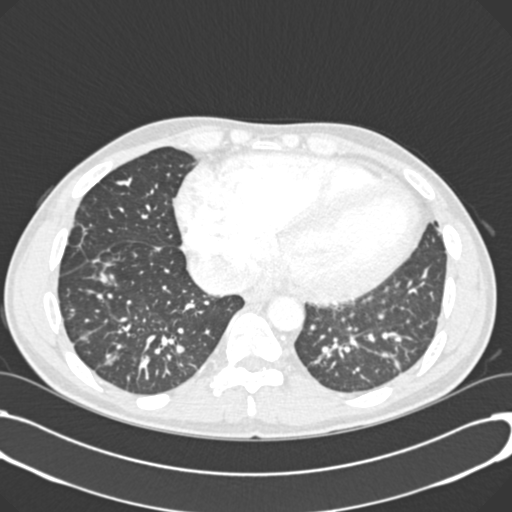
[im 95/283  mediastinal]
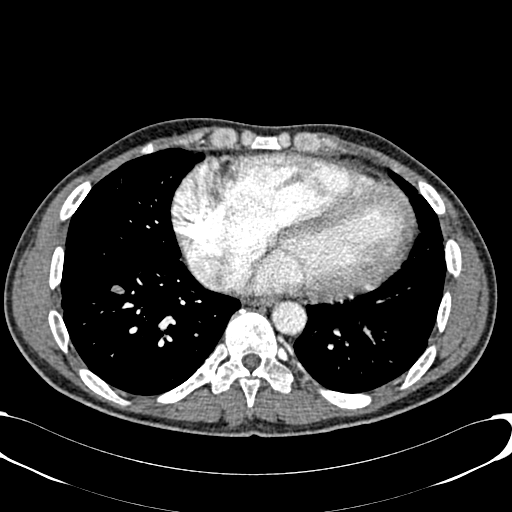
[im 99/283  lung]
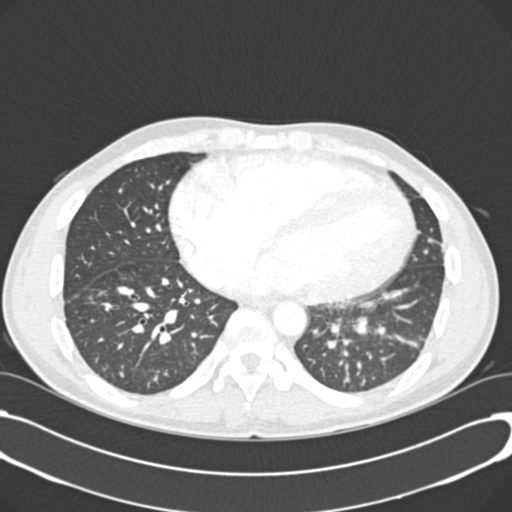
[im 113/283  mediastinal]
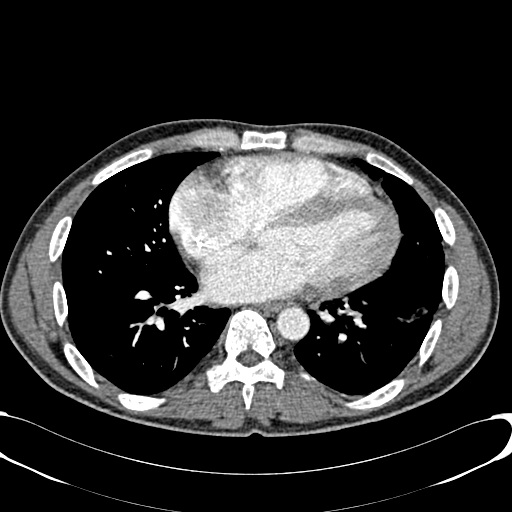
[im 127/283  lung]
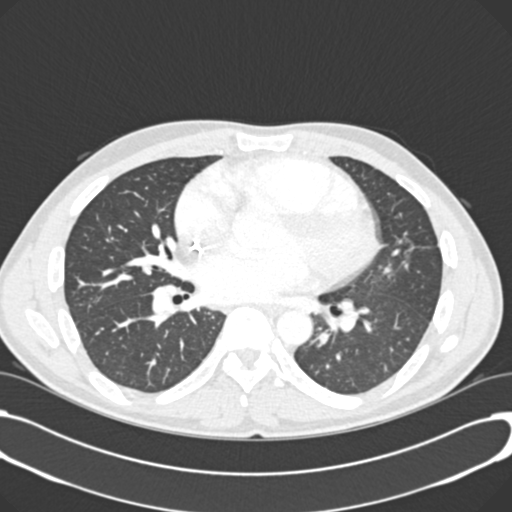
[im 142/283  mediastinal]
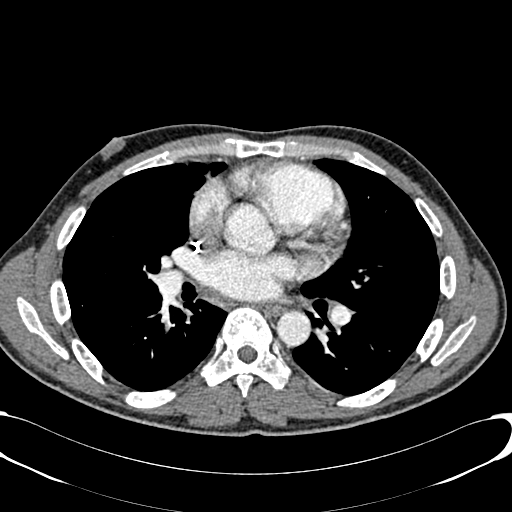
[im 156/283  lung]
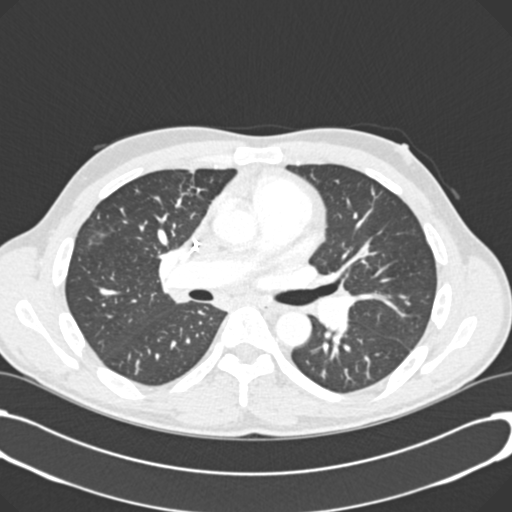
[im 170/283  mediastinal]
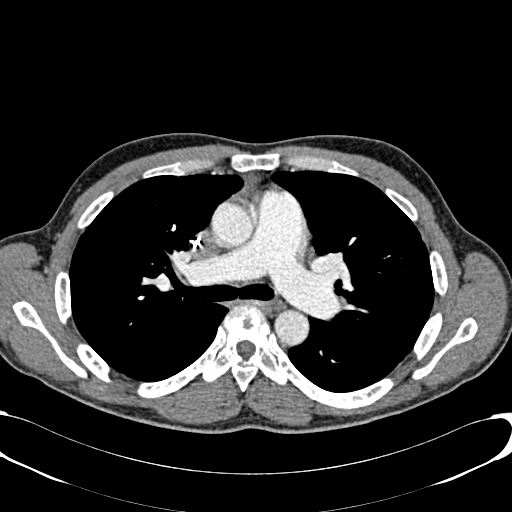
[im 184/283  lung]
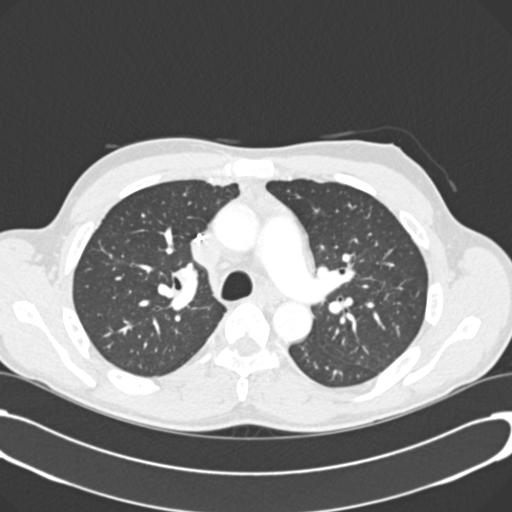
[im 189/283  mediastinal]
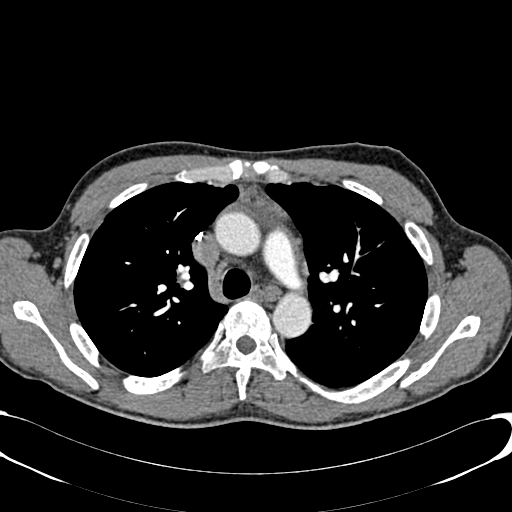
[im 198/283  lung]
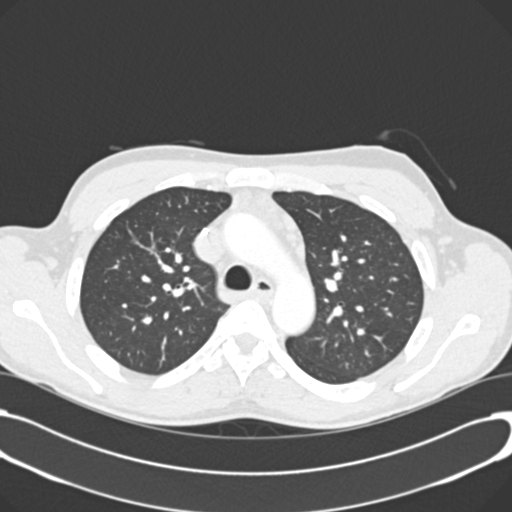
[im 212/283  mediastinal]
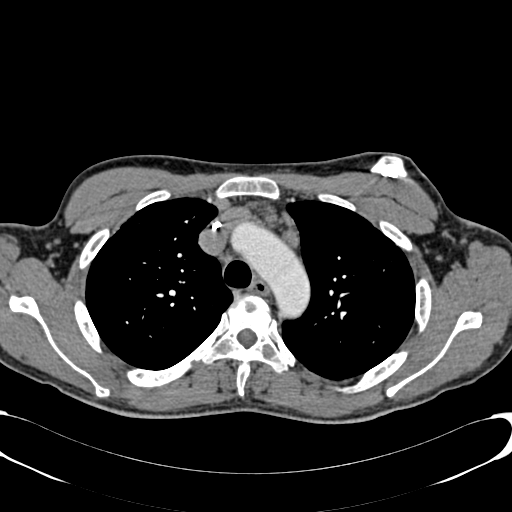
[im 240/283  lung]
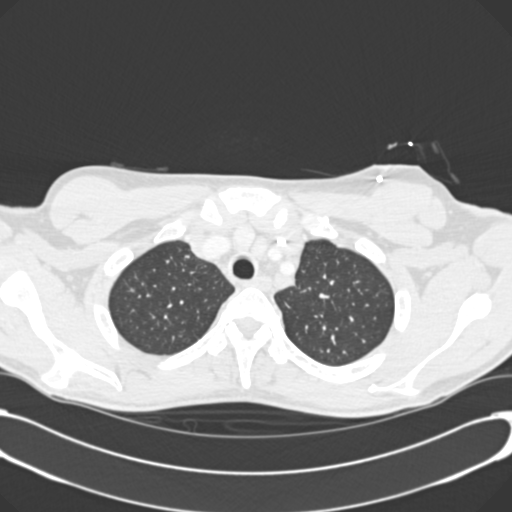
[im 254/283  mediastinal]
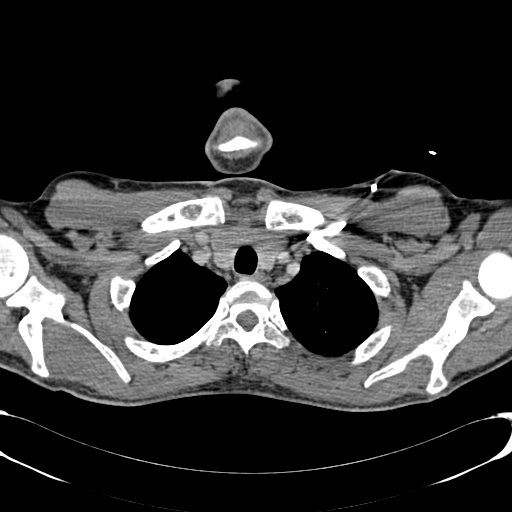
[im 268/283  lung]
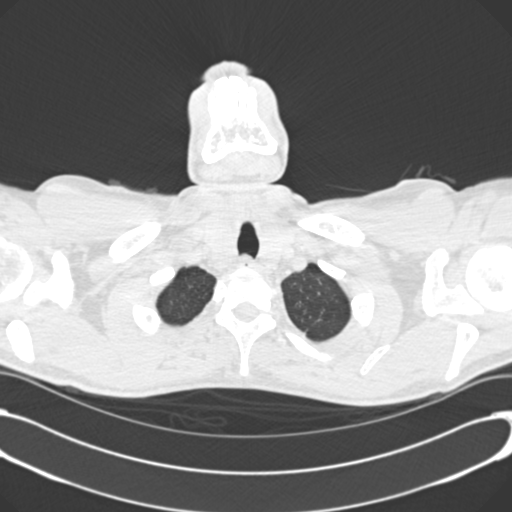

[19 of 32 positions shown; findings below may reference images not displayed]

FINDINGS: There is a pulmonary embolus in a peripheral branch of
the superior   right lower lobe superior segment pulmonary artery.
There is no thoracic aortic aneurysm or dissection.

There is patchy infiltrate in the anterior and lateral segments of
the left lower lobe.  There is also patchy infiltrate in the
lateral segment right lower lobe.  There is patchy bibasilar
atelectasis.  There is also mild atelectatic change in the
posterior segment right upper lobe.  There is mild scarring in the
anterior segment left upper lobe medially.  There is a small area
of infiltrate in the anterior segment left upper lobe on slice 27.

There are multiple lymph nodes throughout the mediastinum, largest
adjacent to the aortic arch.  The largest of these lymph nodes
measures 1 x 0.9 cm.  By size criteria, there is no frank
adenopathy.

Port-A-Cath tip is at the cavoatrial junction.

Heart is enlarged.  Pericardium is not thickened.

Visualized upper abdominal structures appear normal except for
diffuse calcification in the spleen.  There are vertebral body end
plate defects consistent with peripheral infarcts from known sickle
cell disease.
IMPRESSION: Small pulmonary embolus in a branch of the superior segment right
lower lobe pulmonary artery.  No other pulmonary emboli identified.

Bony changes of sickle cell disease.  Splenic changes of sickle
cell disease.

Areas of patchy infiltrate bilaterally.

Stable small mediastinal lymph nodes which are stable.

## 2015-07-22 DIAGNOSIS — R0789 Other chest pain: Secondary | ICD-10-CM | POA: Diagnosis not present

## 2015-07-22 DIAGNOSIS — J9621 Acute and chronic respiratory failure with hypoxia: Secondary | ICD-10-CM | POA: Diagnosis not present

## 2015-07-22 DIAGNOSIS — D571 Sickle-cell disease without crisis: Secondary | ICD-10-CM | POA: Diagnosis not present

## 2015-07-22 DIAGNOSIS — D5700 Hb-SS disease with crisis, unspecified: Secondary | ICD-10-CM | POA: Diagnosis not present

## 2015-07-22 DIAGNOSIS — R918 Other nonspecific abnormal finding of lung field: Secondary | ICD-10-CM | POA: Diagnosis not present

## 2015-07-22 DIAGNOSIS — I279 Pulmonary heart disease, unspecified: Secondary | ICD-10-CM | POA: Diagnosis not present

## 2015-07-22 DIAGNOSIS — Z7901 Long term (current) use of anticoagulants: Secondary | ICD-10-CM | POA: Diagnosis not present

## 2015-07-22 DIAGNOSIS — I5022 Chronic systolic (congestive) heart failure: Secondary | ICD-10-CM | POA: Diagnosis not present

## 2015-07-22 DIAGNOSIS — I2699 Other pulmonary embolism without acute cor pulmonale: Secondary | ICD-10-CM | POA: Diagnosis not present

## 2015-07-22 DIAGNOSIS — I2782 Chronic pulmonary embolism: Secondary | ICD-10-CM | POA: Diagnosis not present

## 2015-07-22 DIAGNOSIS — D5701 Hb-SS disease with acute chest syndrome: Secondary | ICD-10-CM | POA: Diagnosis not present

## 2015-07-22 DIAGNOSIS — I110 Hypertensive heart disease with heart failure: Secondary | ICD-10-CM | POA: Diagnosis not present

## 2015-07-23 ENCOUNTER — Inpatient Hospital Stay: Payer: Self-pay | Admitting: Adult Health

## 2015-07-23 DIAGNOSIS — I5022 Chronic systolic (congestive) heart failure: Secondary | ICD-10-CM | POA: Diagnosis not present

## 2015-07-23 DIAGNOSIS — R0789 Other chest pain: Secondary | ICD-10-CM | POA: Diagnosis not present

## 2015-07-23 DIAGNOSIS — J9621 Acute and chronic respiratory failure with hypoxia: Secondary | ICD-10-CM | POA: Diagnosis not present

## 2015-07-23 DIAGNOSIS — D5701 Hb-SS disease with acute chest syndrome: Secondary | ICD-10-CM | POA: Diagnosis not present

## 2015-07-23 DIAGNOSIS — Z7901 Long term (current) use of anticoagulants: Secondary | ICD-10-CM | POA: Diagnosis not present

## 2015-07-23 DIAGNOSIS — I110 Hypertensive heart disease with heart failure: Secondary | ICD-10-CM | POA: Diagnosis not present

## 2015-07-23 DIAGNOSIS — I2782 Chronic pulmonary embolism: Secondary | ICD-10-CM | POA: Diagnosis not present

## 2015-07-24 DIAGNOSIS — D5700 Hb-SS disease with crisis, unspecified: Secondary | ICD-10-CM | POA: Diagnosis not present

## 2015-07-24 DIAGNOSIS — J9621 Acute and chronic respiratory failure with hypoxia: Secondary | ICD-10-CM | POA: Diagnosis not present

## 2015-07-24 DIAGNOSIS — R0789 Other chest pain: Secondary | ICD-10-CM | POA: Diagnosis not present

## 2015-07-24 DIAGNOSIS — I5022 Chronic systolic (congestive) heart failure: Secondary | ICD-10-CM | POA: Diagnosis not present

## 2015-07-24 DIAGNOSIS — D571 Sickle-cell disease without crisis: Secondary | ICD-10-CM | POA: Diagnosis not present

## 2015-07-24 DIAGNOSIS — Z7901 Long term (current) use of anticoagulants: Secondary | ICD-10-CM | POA: Diagnosis not present

## 2015-07-24 DIAGNOSIS — D5701 Hb-SS disease with acute chest syndrome: Secondary | ICD-10-CM | POA: Diagnosis not present

## 2015-07-24 DIAGNOSIS — I279 Pulmonary heart disease, unspecified: Secondary | ICD-10-CM | POA: Diagnosis not present

## 2015-07-24 DIAGNOSIS — I110 Hypertensive heart disease with heart failure: Secondary | ICD-10-CM | POA: Diagnosis not present

## 2015-07-24 DIAGNOSIS — I2782 Chronic pulmonary embolism: Secondary | ICD-10-CM | POA: Diagnosis not present

## 2015-07-25 DIAGNOSIS — D5700 Hb-SS disease with crisis, unspecified: Secondary | ICD-10-CM | POA: Diagnosis not present

## 2015-07-25 DIAGNOSIS — I2782 Chronic pulmonary embolism: Secondary | ICD-10-CM | POA: Diagnosis not present

## 2015-07-25 DIAGNOSIS — R0789 Other chest pain: Secondary | ICD-10-CM | POA: Diagnosis not present

## 2015-07-25 DIAGNOSIS — J9621 Acute and chronic respiratory failure with hypoxia: Secondary | ICD-10-CM | POA: Diagnosis not present

## 2015-07-25 DIAGNOSIS — D5701 Hb-SS disease with acute chest syndrome: Secondary | ICD-10-CM | POA: Diagnosis not present

## 2015-07-25 DIAGNOSIS — I5022 Chronic systolic (congestive) heart failure: Secondary | ICD-10-CM | POA: Diagnosis not present

## 2015-07-25 DIAGNOSIS — I110 Hypertensive heart disease with heart failure: Secondary | ICD-10-CM | POA: Diagnosis not present

## 2015-07-25 DIAGNOSIS — D571 Sickle-cell disease without crisis: Secondary | ICD-10-CM | POA: Diagnosis not present

## 2015-07-25 DIAGNOSIS — I279 Pulmonary heart disease, unspecified: Secondary | ICD-10-CM | POA: Diagnosis not present

## 2015-07-25 DIAGNOSIS — Z7901 Long term (current) use of anticoagulants: Secondary | ICD-10-CM | POA: Diagnosis not present

## 2015-07-26 DIAGNOSIS — I110 Hypertensive heart disease with heart failure: Secondary | ICD-10-CM | POA: Diagnosis not present

## 2015-07-26 DIAGNOSIS — I5022 Chronic systolic (congestive) heart failure: Secondary | ICD-10-CM | POA: Diagnosis not present

## 2015-07-26 DIAGNOSIS — R0789 Other chest pain: Secondary | ICD-10-CM | POA: Diagnosis not present

## 2015-07-26 DIAGNOSIS — Z7901 Long term (current) use of anticoagulants: Secondary | ICD-10-CM | POA: Diagnosis not present

## 2015-07-26 DIAGNOSIS — I279 Pulmonary heart disease, unspecified: Secondary | ICD-10-CM | POA: Diagnosis not present

## 2015-07-26 DIAGNOSIS — I2782 Chronic pulmonary embolism: Secondary | ICD-10-CM | POA: Diagnosis not present

## 2015-07-26 DIAGNOSIS — D5701 Hb-SS disease with acute chest syndrome: Secondary | ICD-10-CM | POA: Diagnosis not present

## 2015-07-26 DIAGNOSIS — J9621 Acute and chronic respiratory failure with hypoxia: Secondary | ICD-10-CM | POA: Diagnosis not present

## 2015-07-26 DIAGNOSIS — D5700 Hb-SS disease with crisis, unspecified: Secondary | ICD-10-CM | POA: Diagnosis not present

## 2015-07-26 DIAGNOSIS — D571 Sickle-cell disease without crisis: Secondary | ICD-10-CM | POA: Diagnosis not present

## 2015-07-27 DIAGNOSIS — J9621 Acute and chronic respiratory failure with hypoxia: Secondary | ICD-10-CM | POA: Diagnosis not present

## 2015-07-27 DIAGNOSIS — D5700 Hb-SS disease with crisis, unspecified: Secondary | ICD-10-CM | POA: Diagnosis not present

## 2015-07-27 DIAGNOSIS — D5701 Hb-SS disease with acute chest syndrome: Secondary | ICD-10-CM | POA: Diagnosis not present

## 2015-07-27 DIAGNOSIS — I5022 Chronic systolic (congestive) heart failure: Secondary | ICD-10-CM | POA: Diagnosis not present

## 2015-07-27 DIAGNOSIS — I279 Pulmonary heart disease, unspecified: Secondary | ICD-10-CM | POA: Diagnosis not present

## 2015-07-27 DIAGNOSIS — I2782 Chronic pulmonary embolism: Secondary | ICD-10-CM | POA: Diagnosis not present

## 2015-07-27 DIAGNOSIS — Z7901 Long term (current) use of anticoagulants: Secondary | ICD-10-CM | POA: Diagnosis not present

## 2015-07-27 DIAGNOSIS — I110 Hypertensive heart disease with heart failure: Secondary | ICD-10-CM | POA: Diagnosis not present

## 2015-07-30 ENCOUNTER — Non-Acute Institutional Stay (HOSPITAL_COMMUNITY)
Admission: AD | Admit: 2015-07-30 | Discharge: 2015-07-30 | Disposition: A | Discharge status: Home / Self Care | Payer: Medicare Other | Source: Ambulatory Visit | Attending: Internal Medicine | Admitting: Internal Medicine

## 2015-07-30 ENCOUNTER — Encounter (HOSPITAL_COMMUNITY): Payer: Self-pay

## 2015-07-30 ENCOUNTER — Telehealth (HOSPITAL_COMMUNITY): Payer: Self-pay | Admitting: Internal Medicine

## 2015-07-30 DIAGNOSIS — M79605 Pain in left leg: Secondary | ICD-10-CM | POA: Diagnosis present

## 2015-07-30 DIAGNOSIS — Z86711 Personal history of pulmonary embolism: Secondary | ICD-10-CM | POA: Insufficient documentation

## 2015-07-30 DIAGNOSIS — Z7982 Long term (current) use of aspirin: Secondary | ICD-10-CM | POA: Insufficient documentation

## 2015-07-30 DIAGNOSIS — Z87891 Personal history of nicotine dependence: Secondary | ICD-10-CM | POA: Diagnosis not present

## 2015-07-30 DIAGNOSIS — I10 Essential (primary) hypertension: Secondary | ICD-10-CM | POA: Insufficient documentation

## 2015-07-30 DIAGNOSIS — D5700 Hb-SS disease with crisis, unspecified: Secondary | ICD-10-CM | POA: Diagnosis present

## 2015-07-30 DIAGNOSIS — Z7901 Long term (current) use of anticoagulants: Secondary | ICD-10-CM | POA: Insufficient documentation

## 2015-07-30 DIAGNOSIS — Z79891 Long term (current) use of opiate analgesic: Secondary | ICD-10-CM | POA: Diagnosis not present

## 2015-07-30 DIAGNOSIS — Z86718 Personal history of other venous thrombosis and embolism: Secondary | ICD-10-CM | POA: Diagnosis not present

## 2015-07-30 DIAGNOSIS — Z96641 Presence of right artificial hip joint: Secondary | ICD-10-CM | POA: Diagnosis not present

## 2015-07-30 DIAGNOSIS — Z79899 Other long term (current) drug therapy: Secondary | ICD-10-CM | POA: Diagnosis not present

## 2015-07-30 LAB — CBC WITH DIFFERENTIAL/PLATELET
BASOS ABS: 0.2 10*3/uL — AB (ref 0.0–0.1)
BASOS PCT: 2 %
EOS ABS: 0.3 10*3/uL (ref 0.0–0.7)
Eosinophils Relative: 3 %
HCT: 20.8 % — ABNORMAL LOW (ref 39.0–52.0)
Hemoglobin: 6.7 g/dL — CL (ref 13.0–17.0)
LYMPHS PCT: 17 %
Lymphs Abs: 1.6 10*3/uL (ref 0.7–4.0)
MCH: 30.6 pg (ref 26.0–34.0)
MCHC: 32.2 g/dL (ref 30.0–36.0)
MCV: 95 fL (ref 78.0–100.0)
MONO ABS: 0.9 10*3/uL (ref 0.1–1.0)
Monocytes Relative: 10 %
NEUTROS PCT: 68 %
Neutro Abs: 6.4 10*3/uL (ref 1.7–7.7)
PLATELETS: 526 10*3/uL — AB (ref 150–400)
RBC: 2.19 MIL/uL — ABNORMAL LOW (ref 4.22–5.81)
RDW: 21.7 % — AB (ref 11.5–15.5)
WBC: 9.4 10*3/uL (ref 4.0–10.5)

## 2015-07-30 LAB — COMPREHENSIVE METABOLIC PANEL
ALT: 36 U/L (ref 17–63)
AST: 47 U/L — AB (ref 15–41)
Albumin: 4.3 g/dL (ref 3.5–5.0)
Alkaline Phosphatase: 82 U/L (ref 38–126)
Anion gap: 5 (ref 5–15)
BUN: 15 mg/dL (ref 6–20)
CHLORIDE: 107 mmol/L (ref 101–111)
CO2: 24 mmol/L (ref 22–32)
Calcium: 9.4 mg/dL (ref 8.9–10.3)
Creatinine, Ser: 0.57 mg/dL — ABNORMAL LOW (ref 0.61–1.24)
Glucose, Bld: 107 mg/dL — ABNORMAL HIGH (ref 65–99)
POTASSIUM: 4.2 mmol/L (ref 3.5–5.1)
SODIUM: 136 mmol/L (ref 135–145)
Total Bilirubin: 2.9 mg/dL — ABNORMAL HIGH (ref 0.3–1.2)
Total Protein: 8 g/dL (ref 6.5–8.1)

## 2015-07-30 LAB — RETICULOCYTES
RBC.: 2.19 MIL/uL — AB (ref 4.22–5.81)
RETIC COUNT ABSOLUTE: 35 10*3/uL (ref 19.0–186.0)
RETIC CT PCT: 1.6 % (ref 0.4–3.1)

## 2015-07-30 MED ORDER — ONDANSETRON HCL 4 MG/2ML IJ SOLN: 4.0000 mg | Freq: Four times a day (QID) | INTRAMUSCULAR | Status: DC | PRN

## 2015-07-30 MED ORDER — SODIUM CHLORIDE 0.9% FLUSH: 9.0000 mL | INTRAVENOUS | Status: DC | PRN

## 2015-07-30 MED ORDER — SODIUM CHLORIDE 0.9% FLUSH: 10.0000 mL | INTRAVENOUS | Status: DC | PRN

## 2015-07-30 MED ORDER — DIPHENHYDRAMINE HCL 25 MG PO CAPS: 25.0000 mg | ORAL_CAPSULE | ORAL | Status: DC | PRN

## 2015-07-30 MED ORDER — HEPARIN SOD (PORK) LOCK FLUSH 100 UNIT/ML IV SOLN
500.0000 [IU] | INTRAVENOUS | Status: DC | PRN
  Administered 2015-07-30: 500 [IU]
  Filled 2015-07-30 (×2): qty 5

## 2015-07-30 MED ORDER — SODIUM CHLORIDE 0.9 % IV SOLN
25.0000 mg | INTRAVENOUS | Status: DC | PRN
  Filled 2015-07-30: qty 0.5

## 2015-07-30 MED ORDER — HYDROMORPHONE 1 MG/ML IV SOLN
INTRAVENOUS | Status: DC
  Administered 2015-07-30: 9.8 mg via INTRAVENOUS
  Administered 2015-07-30: 09:00:00 via INTRAVENOUS
  Administered 2015-07-30: 7 mg via INTRAVENOUS
  Filled 2015-07-30: qty 25

## 2015-07-30 MED ORDER — DEXTROSE-NACL 5-0.45 % IV SOLN
INTRAVENOUS | Status: DC
  Administered 2015-07-30: 09:00:00 via INTRAVENOUS

## 2015-07-30 MED ORDER — NALOXONE HCL 0.4 MG/ML IJ SOLN: 0.4000 mg | INTRAMUSCULAR | Status: DC | PRN

## 2015-07-30 NOTE — Progress Notes (Signed)
Pt discharged to home; discharge instructions explained, given, and signed; all questions answered; pt alert, oriented, ambulatory, states pain level is 3/10 upon discharge; no complications noted

## 2015-07-30 NOTE — Progress Notes (Signed)
CRITICAL VALUE ALERT  Critical value received: Hgb, 6.7  Date of notification:  07/30/15  Time of notification:  [id number]  Critical value read back:Yes.    Nurse who received alert:  B. Tamala Julian   MD notified (1st page):  [place] Hollis, NP  Time of first page:  [phone]  MD notified (2nd page):  Time of second page:  Responding MD:  [place] Hollis, NP  Time MD responded:  [phone]

## 2015-07-30 NOTE — Discharge Summary (Signed)
[facility] Discharge Summary   Patient ID: [name] MRN: [id number] DOB/AGE: 1980/07/30 35 y.o.  Admit date: 07/30/2015 Discharge date: 07/30/2015  Primary Care Physician:  [name], MD  Admission Diagnoses:  Active Problems:   Sickle cell anemia with pain [facility])  Discharge Medications:    Medication List    ASK your doctor about these medications        acetaminophen 325 MG tablet  Commonly known as:  TYLENOL  Take 650 mg by mouth every 6 (six) hours as needed for moderate pain.     aspirin 81 MG chewable tablet  Chew 1 tablet (81 mg total) by mouth daily.     folic acid 1 MG tablet  Commonly known as:  FOLVITE  Take 1 tablet (1 mg total) by mouth every morning.     gabapentin 300 MG capsule  Commonly known as:  NEURONTIN  Take 1 capsule (300 mg total) by mouth 3 (three) times daily.     HYDROmorphone 4 MG tablet  Commonly known as:  DILAUDID  Take 1 tablet (4 mg total) by mouth every 4 (four) hours as needed for severe pain.     hydroxyurea 500 MG capsule  Commonly known as:  HYDREA  Take 2 capsules (1,000 mg total) by mouth daily. May take with food to minimize GI side effects.     JADENU 360 MG Tabs  Generic drug:  Deferasirox  Take 1,080 mg by mouth 2 (two) times daily.     morphine 30 MG 12 hr tablet  Commonly known as:  MS CONTIN  Take 1 tablet (30 mg total) by mouth every 12 (twelve) hours.     potassium chloride SA 20 MEQ tablet  Commonly known as:  K-DUR,KLOR-CON  Take 1 tablet (20 mEq total) by mouth every morning.     Vitamin D 2000 units tablet  Take 1 tablet (2,000 Units total) by mouth daily.     XARELTO 15 MG Tabs tablet  Generic drug:  Rivaroxaban  TK 1 T PO BID WITH MEALS     zolpidem 10 MG tablet  Commonly known as:  AMBIEN  Take 1 tablet (10 mg total) by mouth at bedtime as needed for sleep.         Consults:  None  Significant Diagnostic Studies:  Dg Chest 2 View  07/18/2015  CLINICAL  DATA:  Short of breath today. History of sickle cell disease. EXAM: CHEST  2 VIEW COMPARISON:  07/17/2015 FINDINGS: There is moderate enlargement of the cardiac silhouette, unchanged. No mediastinal or hilar masses or evidence of adenopathy. Mild right lung base scarring. There are prominent bronchovascular markings, but no evidence of pneumonia or pulmonary edema. No pleural effusion or pneumothorax. Left anterior chest wall Port-A-Cath is stable. Bony thorax is intact. No change from the prior study. IMPRESSION: No acute cardiopulmonary disease. Electronically Signed   By: Lajean Manes M.D.   On: 07/18/2015 16:08   Dg Chest 2 View  07/17/2015  CLINICAL DATA:  Sickle cell crisis with pain and shortness of Breath EXAM: CHEST  2 VIEW COMPARISON:  07/09/2015 FINDINGS: Cardiac shadow is again enlarged. Left chest wall port is again seen and stable. No focal infiltrate or sizable effusion is noted. No acute bony abnormality is seen. IMPRESSION: No acute abnormality noted. Electronically Signed   By: Inez Catalina M.D.   On: 07/17/2015 14:50   Dg Chest 2 View  07/08/2015  CLINICAL DATA:  Shortness of breath,, hypoxia, history  of sickle cell EXAM: CHEST  2 VIEW COMPARISON:  07/07/2015 FINDINGS: Cardiomegaly again noted. Left subclavian Port-A-Cath is unchanged in position. No acute infiltrate or pleural effusion. No pulmonary edema. IMPRESSION: No active cardiopulmonary disease. Electronically Signed   By: Lahoma Crocker M.D.   On: 07/08/2015 11:01   X-ray Chest Pa And Lateral  07/07/2015  CLINICAL DATA:  Shortness of breath, history of sickle cell EXAM: CHEST  2 VIEW COMPARISON:  07/06/2015 FINDINGS: Moderate cardiac silhouette enlargement stable. Minimal vascular congestion. No pleural effusion. No consolidation. IMPRESSION: Cardiac enlargement stable with development of very mild vascular congestion. Electronically Signed   By: Skipper Cliche M.D.   On: 07/07/2015 11:27   Dg Chest 2 View  07/06/2015  CLINICAL  DATA:  Sickle cell crisis with chest pain and shortness of breath today. EXAM: CHEST  2 VIEW COMPARISON:  Chest x-ray dated 06/19/2015. FINDINGS: Cardiomegaly is stable. Left chest wall Port-A-Cath is stable in position with tip overlying the expected location of the cavoatrial junction. Pulmonary vasculature remains mildly prominent, without frank pulmonary edema. Suspect some degree of chronic pulmonary artery hypertension. No evidence of pneumonia. No pleural effusion or pneumothorax seen. Osseous structures about the chest are unremarkable. IMPRESSION: Stable cardiomegaly.  No acute findings. Electronically Signed   By: Franki Cabot M.D.   On: 07/06/2015 12:50   Dg Chest Port 1 View  07/09/2015  CLINICAL DATA:  Shortness of breath.  Sickle cell disease EXAM: PORTABLE CHEST 1 VIEW COMPARISON:  July 08, 2015 FINDINGS: Port-A-Cath tip is in the superior cava. No pneumothorax. There is no edema or consolidation. There is generalized cardiac enlargement. There is pulmonary venous hypertension. No adenopathy evident. No bone lesions. IMPRESSION: Cardiomegaly with pulmonary vascular congestion.  No consolidation. Electronically Signed   By: Lowella Grip III M.D.   On: 07/09/2015 08:04     [facility] Course: [name] was admitted to the day infusion center for extended observation.  Reviewed laboratory values, hemoglobin was 6.7, which is consistent with baseline.  Patient was started on high concentration PCA dilaudid, he used a total of 16.8 mg with 26 demands and 24 deliveries. Pain intensity decreased from 9/10 to 4/10.  He says that he can manage pain at home on medication regimen.  Recommend that he continues to hydrate with 64 ounces of water per day.  Patient is alert, oriented, and ambulating. He will discharge home in stable condition.  The patient was given clear instructions to go to ER or return to medical center if symptoms do not improve, worsen or new problems  develop. The patient verbalized understanding.   Physical Exam at Discharge:  BP 104/65 mmHg  Pulse 92  Temp(Src) 98.4 F (36.9 C) (Oral)  Resp 23  SpO2 94%  BP 104/65 mmHg  Pulse 92  Temp(Src) 98.4 F (36.9 C) (Oral)  Resp 23  SpO2 94%  General Appearance:    Alert, cooperative, no distress, appears stated age  Head:    Normocephalic, without obvious abnormality, atraumatic  Back:     Symmetric, no curvature, ROM normal, no CVA tenderness  Lungs:     Clear to auscultation bilaterally, respirations unlabored  Chest wall:    No tenderness or deformity  Heart:    Regular rate and rhythm, S1 and S2 normal, no murmur, rub   or gallop  Abdomen:     Soft, non-tender, bowel sounds active all four quadrants,    no masses, no organomegaly  Lymph nodes:  Cervical, supraclavicular, and axillary nodes normal  Neurologic:   CNII-XII intact. Normal strength, sensation and reflexes      throughout   Disposition at Discharge: Home  Discharge Orders:   Condition at Discharge:   Stable  Time spent on Discharge:  15 minutes  Signed: Nanette Wirsing M 07/30/2015, 1:38 PM

## 2015-07-30 NOTE — Telephone Encounter (Signed)
Pt called and states experiencing leg and side pain; states taken home pain medications with no relief; denies chest pain, shortness of breath, nausea, vomiting, or diarrhea; requests to come to the day hospital for evaluation; NP notified; notified pt that, per NP, he may come to [facility] for evaluation

## 2015-07-30 NOTE — Discharge Instructions (Signed)
Sickle Cell Anemia, Adult Sickle cell anemia is a condition in which red blood cells have an abnormal "sickle" shape. This abnormal shape shortens the cells' life span, which results in a lower than normal concentration of red blood cells in the blood. The sickle shape also causes the cells to clump together and block free blood flow through the blood vessels. As a result, the tissues and organs of the body do not receive enough oxygen. Sickle cell anemia causes organ damage and pain and increases the risk of infection. CAUSES  Sickle cell anemia is a genetic disorder. Those who receive two copies of the gene have the condition, and those who receive one copy have the trait. RISK FACTORS The sickle cell gene is most common in people whose families originated in [place]. Other areas of the globe where sickle cell trait occurs include the [place], [place] and [place], the [place], and the [place].  SIGNS AND SYMPTOMS  Pain, especially in the extremities, back, chest, or abdomen (common). The pain may start suddenly or may develop following an illness, especially if there is dehydration. Pain can also occur due to overexertion or exposure to extreme temperature changes.  Frequent severe bacterial infections, especially certain types of pneumonia and meningitis.  Pain and swelling in the hands and feet.  Decreased activity.   Loss of appetite.   Change in behavior.  Headaches.  Seizures.  Shortness of breath or difficulty breathing.  Vision changes.  Skin ulcers. Those with the trait may not have symptoms or they may have mild symptoms.  DIAGNOSIS  Sickle cell anemia is diagnosed with blood tests that demonstrate the genetic trait. It is often diagnosed during the newborn period, due to mandatory testing nationwide. A variety of blood tests, X-rays, CT scans, MRI scans, ultrasounds, and lung function tests may also be done to monitor the condition. TREATMENT  Sickle  cell anemia may be treated with:  Medicines. You may be given pain medicines, antibiotic medicines (to treat and prevent infections) or medicines to increase the production of certain types of hemoglobin.  Fluids.  Oxygen.  Blood transfusions. HOME CARE INSTRUCTIONS   Drink enough fluid to keep your urine clear or pale yellow. Increase your fluid intake in hot weather and during exercise.  Do not smoke. Smoking lowers oxygen levels in the blood.   Only take over-the-counter or prescription medicines for pain, fever, or discomfort as directed by your health care provider.  Take antibiotics as directed by your health care provider. Make sure you finish them it even if you start to feel better.   Take supplements as directed by your health care provider.   Consider wearing a medical alert bracelet. This tells anyone caring for you in an emergency of your condition.   When traveling, keep your medical information, health care provider's names, and the medicines you take with you at all times.   If you develop a fever, do not take medicines to reduce the fever right away. This could cover up a problem that is developing. Notify your health care provider.  Keep all follow-up appointments with your health care provider. Sickle cell anemia requires regular medical care. SEEK MEDICAL CARE IF: You have a fever. SEEK IMMEDIATE MEDICAL CARE IF:   You feel dizzy or faint.   You have new abdominal pain, especially on the left side near the stomach area.   You develop a persistent, often uncomfortable and painful penile erection (priapism). If this is not treated immediately it   will lead to impotence.   You have numbness your arms or legs or you have a hard time moving them.   You have a hard time with speech.   You have a fever or persistent symptoms for more than 2-3 days.   You have a fever and your symptoms suddenly get worse.   You have signs or symptoms of infection.  These include:   Chills.   Abnormal tiredness (lethargy).   Irritability.   Poor eating.   Vomiting.   You develop pain that is not helped with medicine.   You develop shortness of breath.  You have pain in your chest.   You are coughing up pus-like or bloody sputum.   You develop a stiff neck.  Your feet or hands swell or have pain.  Your abdomen appears bloated.  You develop joint pain. MAKE SURE YOU:  Understand these instructions.   This information is not intended to replace advice given to you by your health care provider. Make sure you discuss any questions you have with your health care provider.   Document Released: 04/27/2005 Document Revised: 02/07/2014 Document Reviewed: 08/29/2012 Elsevier Interactive Patient Education 2016 [employer]  

## 2015-07-30 NOTE — H&P (Signed)
[facility] History and Physical   Date: 07/30/2015  Patient name: [name] Medical record number: [id number] Date of birth: Jun 07, 1980 Age: 35 y.o. Gender: male PCP: [name], MD  Attending physician: Tresa Garter, MD  Chief Complaint: Bilateral lower extremity pain  History of Present Illness: [name], 35 year old male with a history of sickle cell anemia, HbSS presents with bilateral lower extremity pain that is consistent with sickle cell anemia. Patient reports that pain intensity has been increasing over the past 24 hours. He was discharged from [facility] on 07/27/2015 with pulmonary embolism and  pulmonary hypertension. He has not identified any palliative or provocative factors. He says that current pain intensity is 8/10 descried as constant and throbbing. He last had Dilaudid this morning around 6 am with minimal relief. He says that he has been taking all other medications consistently. He also reports that he has not been wearing home oxygen consistently. He denies headache, chest pain, dysuria, abdominal pain, nausea, vomiting, or diarrhea.   Meds: Prescriptions prior to admission  Medication Sig Dispense Refill Last Dose  . acetaminophen (TYLENOL) 325 MG tablet Take 650 mg by mouth every 6 (six) hours as needed for moderate pain.   unknown at unknown time  . aspirin 81 MG chewable tablet Chew 1 tablet (81 mg total) by mouth daily. 30 tablet 11 07/17/2015 at Unknown time  . Cholecalciferol (VITAMIN D) 2000 units tablet Take 1 tablet (2,000 Units total) by mouth daily. 90 tablet 3 07/17/2015 at Unknown time  . Deferasirox (JADENU) 360 MG TABS Take 1,080 mg by mouth 2 (two) times daily.    07/18/2015 at 0900  . folic acid (FOLVITE) 1 MG tablet Take 1 tablet (1 mg total) by mouth every morning. 30 tablet 11 07/17/2015 at Unknown time  . gabapentin (NEURONTIN) 300 MG capsule Take 1 capsule (300 mg total) by mouth 3 (three)  times daily. 270 capsule 3 07/17/2015 at Unknown time  . HYDROmorphone (DILAUDID) 4 MG tablet Take 1 tablet (4 mg total) by mouth every 4 (four) hours as needed for severe pain. 90 tablet 0 07/18/2015 at Unknown time  . hydroxyurea (HYDREA) 500 MG capsule Take 2 capsules (1,000 mg total) by mouth daily. May take with food to minimize GI side effects. (Patient taking differently: Take 1,500 mg by mouth daily. May take with food to minimize GI side effects.) 60 capsule 5 07/17/2015 at 2030  . morphine (MS CONTIN) 30 MG 12 hr tablet Take 1 tablet (30 mg total) by mouth every 12 (twelve) hours. 60 tablet 0 07/18/2015 at Unknown time  . potassium chloride SA (K-DUR,KLOR-CON) 20 MEQ tablet Take 1 tablet (20 mEq total) by mouth every morning. (Patient taking differently: Take 20 mEq by mouth daily. ) 30 tablet 3 07/17/2015 at Unknown time  . XARELTO 15 MG TABS tablet TK 1 T PO BID WITH MEALS  0 07/17/2015 at 2030  . zolpidem (AMBIEN) 10 MG tablet Take 1 tablet (10 mg total) by mouth at bedtime as needed for sleep. 30 tablet 0 07/17/2015 at Unknown time    Allergies: Review of patient's allergies indicates no known allergies. Past Medical History  Diagnosis Date  . Sickle cell anemia (HCC)   . Blood transfusion   . Acute embolism and thrombosis of right internal jugular vein ([place])   . Hypokalemia   . Mood disorder ([place])   . History of pulmonary embolus (PE)   . Avascular  necrosis (HCC)     Right Hip  . Leukocytosis     Chronic  . Thrombocytosis (HCC)     Chronic  . Hypertension   . History of Clostridium difficile infection   . Uses marijuana   . Chronic anticoagulation   . Functional asplenia   . Former smoker   . Second hand tobacco smoke exposure   . Alcohol consumption of one to four drinks per day   . Noncompliance with medication regimen   . Sickle-cell crisis with associated acute chest syndrome ([place]) 05/13/2013  . Acute chest syndrome ([place]) 06/18/2013  . Demand ischemia ([place]) 01/02/2014  .  Pulmonary hypertension ([place])   . Hb-SS disease with crisis ([place])   . Oxygen deficiency    Past Surgical History  Procedure Laterality Date  . Right hip replacement      08/2006  . Cholecystectomy      01/2008  . Porta cath placement    . Porta cath removal    . Umbilical hernia repair      01/2008  . Excision of left periauricular cyst      10/2009  . Excision of right ear lobe cyst with primary closur      11/2007  . Portacath placement  01/05/2012    Procedure: INSERTION PORT-A-CATH;  Surgeon: Odis Hollingshead, MD;  Location: [facility];  Service: General;  Laterality: N/A;  ultrasound guiced port a cath insertion with fluoroscopy   Family History  Problem Relation Age of Onset  . Sickle cell trait Mother   . Depression Mother   . Diabetes Mother   . Sickle cell trait Father   . Sickle cell trait Brother    Social History   Social History  . Marital Status: Single    Spouse Name: N/A  . Number of Children: 0  . Years of Education: 13   Occupational History  . 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 Smoking status: Former Smoker -- 0.50 packs/day for 10 years    Types: Cigarettes    Quit date: 05/29/2011  . Smokeless tobacco: Never Used  . Alcohol Use: No  . Drug Use: No     Comment: Marijuana weekly  . Sexual Activity:    Partners: Female     Comment: month ago   Other Topics Concern  . Not on file   Social History Narrative   Lives in an apartment.  Single.  Lives alone but has a girlfriend that helps care for him.  Does not use any assist devices.        [name]  [phone] Mom, emergency contact      [facility]:   Patient continuing to live in her apartment in town alone. 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    Review of Systems: Constitutional: positive for fatigue Eyes: positive for icterus Ears, nose, mouth, throat, and face: negative Respiratory: positive for dyspnea on  exertion Cardiovascular: positive for dyspnea Gastrointestinal: negative for abdominal pain, diarrhea, dysphagia, nausea, reflux symptoms and vomiting Genitourinary:negative Integument/breast: negative Hematologic/lymphatic: negative Musculoskeletal:positive for myalgias Neurological: negative Behavioral/Psych: negative Endocrine: negative Allergic/Immunologic: negative  Physical Exam:  There were no vitals taken for this visit.  General Appearance:    Alert, cooperative, mild distress, appears stated age  Head:    Normocephalic, without obvious abnormality, atraumatic  Eyes:    PERRL, conjunctiva/corneas clear, EOM's intact, fundi  benign, scleral icterus       Ears:    Normal TM's and external ear canals, both ears  Nose:   Nares normal, septum midline, mucosa normal, no drainage    or sinus tenderness  Throat:   Lips, mucosa, and tongue normal; teeth and gums normal  Neck:   Supple, symmetrical, trachea midline, no adenopathy;       thyroid:  No enlargement/tenderness/nodules; no carotid   bruit or JVD  Back:     Symmetric, no curvature, ROM normal, no CVA tenderness  Lungs:     Clear to auscultation bilaterally, respirations unlabored  Chest wall:    No tenderness or deformity  Heart:    Irregular rate and rhythm, S1 and S2 normal, no murmur, rub   or gallop  Abdomen:     Soft, non-tender, bowel sounds active all four quadrants,    no masses, no organomegaly  Extremities:   Extremities normal, atraumatic, no cyanosis or edema  Pulses:   2+ and symmetric all extremities  Skin:   Skin color, texture, turgor normal, no rashes or lesions  Lymph nodes:   Cervical, supraclavicular, and axillary nodes normal  Neurologic:   CNII-XII intact. Normal strength, sensation and reflexes      throughout    Lab results: No results found. However, due to the size of the patient record, not all encounters were searched. Please check Results Review for a complete set of results.  Imaging  results:  No results found.   Assessment & Plan:  Patient will be admitted to the day infusion center for extended observation  Start IV D5.45 for cellular rehydration at 100 ml/hr  Start Dilaudid PCA High Concentration per weight based protocol.   Patient will be re-evaluated for pain intensity in the context of function and relationship to baseline as care progresses.  If no significant pain relief, will transfer patient to inpatient services for a higher level of care.   Will check CMP, reticulocytes and CBC w/differential   Shyne Lehrke M 07/30/2015, 8:57 AM

## 2015-08-02 ENCOUNTER — Emergency Department (HOSPITAL_COMMUNITY): Payer: Medicare Other

## 2015-08-02 ENCOUNTER — Inpatient Hospital Stay (HOSPITAL_COMMUNITY)
Admission: EM | Admit: 2015-08-02 | Discharge: 2015-08-04 | DRG: 812 | Disposition: A | Discharge status: Home / Self Care | Payer: Medicare Other | Attending: Internal Medicine | Admitting: Internal Medicine

## 2015-08-02 ENCOUNTER — Encounter (HOSPITAL_COMMUNITY): Payer: Self-pay | Admitting: Emergency Medicine

## 2015-08-02 DIAGNOSIS — D5700 Hb-SS disease with crisis, unspecified: Secondary | ICD-10-CM | POA: Diagnosis not present

## 2015-08-02 DIAGNOSIS — Z87891 Personal history of nicotine dependence: Secondary | ICD-10-CM | POA: Diagnosis not present

## 2015-08-02 DIAGNOSIS — J9611 Chronic respiratory failure with hypoxia: Secondary | ICD-10-CM | POA: Diagnosis present

## 2015-08-02 DIAGNOSIS — G894 Chronic pain syndrome: Secondary | ICD-10-CM | POA: Diagnosis not present

## 2015-08-02 DIAGNOSIS — D571 Sickle-cell disease without crisis: Secondary | ICD-10-CM | POA: Diagnosis present

## 2015-08-02 DIAGNOSIS — Z9981 Dependence on supplemental oxygen: Secondary | ICD-10-CM

## 2015-08-02 DIAGNOSIS — R079 Chest pain, unspecified: Secondary | ICD-10-CM

## 2015-08-02 DIAGNOSIS — I272 Other secondary pulmonary hypertension: Secondary | ICD-10-CM | POA: Diagnosis present

## 2015-08-02 DIAGNOSIS — I10 Essential (primary) hypertension: Secondary | ICD-10-CM | POA: Diagnosis present

## 2015-08-02 DIAGNOSIS — I2781 Cor pulmonale (chronic): Secondary | ICD-10-CM | POA: Diagnosis not present

## 2015-08-02 DIAGNOSIS — Z79899 Other long term (current) drug therapy: Secondary | ICD-10-CM

## 2015-08-02 DIAGNOSIS — Z7901 Long term (current) use of anticoagulants: Secondary | ICD-10-CM

## 2015-08-02 DIAGNOSIS — Z86711 Personal history of pulmonary embolism: Secondary | ICD-10-CM | POA: Diagnosis not present

## 2015-08-02 DIAGNOSIS — R0789 Other chest pain: Secondary | ICD-10-CM | POA: Diagnosis not present

## 2015-08-02 DIAGNOSIS — R0602 Shortness of breath: Secondary | ICD-10-CM | POA: Diagnosis not present

## 2015-08-02 DIAGNOSIS — D72829 Elevated white blood cell count, unspecified: Secondary | ICD-10-CM | POA: Diagnosis not present

## 2015-08-02 DIAGNOSIS — I2721 Secondary pulmonary arterial hypertension: Secondary | ICD-10-CM | POA: Diagnosis present

## 2015-08-02 DIAGNOSIS — Z86718 Personal history of other venous thrombosis and embolism: Secondary | ICD-10-CM | POA: Diagnosis not present

## 2015-08-02 LAB — I-STAT TROPONIN, ED: Troponin i, poc: 0.01 ng/mL (ref 0.00–0.08)

## 2015-08-02 MED ORDER — HYDROMORPHONE HCL 2 MG/ML IJ SOLN
2.0000 mg | INTRAMUSCULAR | Status: DC | PRN
  Administered 2015-08-02 – 2015-08-03 (×2): 2 mg via INTRAVENOUS
  Filled 2015-08-02 (×2): qty 1

## 2015-08-02 MED ORDER — SODIUM CHLORIDE 0.45 % IV SOLN
INTRAVENOUS | Status: DC
  Administered 2015-08-02: 23:00:00 via INTRAVENOUS

## 2015-08-02 MED ORDER — DIPHENHYDRAMINE HCL 25 MG PO CAPS
50.0000 mg | ORAL_CAPSULE | Freq: Once | ORAL | Status: AC
Start: 2015-08-02 — End: 2015-08-03
  Administered 2015-08-03: 50 mg via ORAL
  Filled 2015-08-02: qty 2

## 2015-08-02 NOTE — ED Notes (Signed)
EKG delayed pt transported to xray

## 2015-08-02 NOTE — ED Notes (Signed)
Pt is c/o shortness of breath and is having bilateral side pain more so on the right than the left  Pt states sxs started tonight

## 2015-08-02 NOTE — ED Provider Notes (Signed)
CSN: [id number]     Arrival date & time 08/02/15  2132 History   First MD Initiated Contact with Patient 08/02/15 2143     Chief Complaint  Patient presents with  . Shortness of Breath     (Consider location/radiation/quality/duration/timing/severity/associated sxs/prior Treatment) HPI 35 year old male who presents with shortness of breath. 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 States that he was managed with diuretics and blood transfusion. He states that he has been doing well, until yesterday evening. Began to have lower bilateral chest wall pain, constant throbbing in nature. It is associated with some shortness of breath. He has noted some mild swelling in his legs. No orthopnea, PND, fevers, cough, pleuritic pain, nausea or vomiting, diarrhea, or urinary complaints. States he feels this is similar to his sickle cell crisis. Past Medical History  Diagnosis Date  . Sickle cell anemia (HCC)   . Blood transfusion   . Acute embolism and thrombosis of right internal jugular vein ([place])   . Hypokalemia   . Mood disorder ([place])   . History of pulmonary embolus (PE)   . Avascular necrosis (HCC)     Right Hip  . Leukocytosis     Chronic  . Thrombocytosis (HCC)     Chronic  . Hypertension   . History of Clostridium difficile infection   . Uses marijuana   . Chronic anticoagulation   . Functional asplenia   . Former smoker   . Second hand tobacco smoke exposure   . Alcohol consumption of one to four drinks per day   . Noncompliance with medication regimen   . Sickle-cell crisis with associated acute chest syndrome ([place]) 05/13/2013  . Acute chest syndrome ([place]) 06/18/2013  . Demand ischemia ([employer]) 01/02/2014  . Pulmonary  hypertension ([place])   . Hb-SS disease with crisis ([place])   . Oxygen deficiency    Past Surgical History  Procedure Laterality Date  . Right hip replacement      08/2006  . Cholecystectomy      01/2008  . Porta cath placement    . Porta cath removal    . Umbilical hernia repair      01/2008  . Excision of left periauricular cyst      10/2009  . Excision of right ear lobe cyst with primary closur      11/2007  . Portacath placement  01/05/2012    Procedure: INSERTION PORT-A-CATH;  Surgeon: Odis Hollingshead, MD;  Location: [facility];  Service: General;  Laterality: N/A;  ultrasound guiced port a cath insertion with fluoroscopy   Family History  Problem Relation Age of Onset  . Sickle cell trait Mother   . Depression Mother   . Diabetes Mother   . Sickle cell trait Father   . Sickle cell trait Brother    Social History  Substance Use Topics  . Smoking status: Former Smoker -- 0.50 packs/day for 10 years    Types: Cigarettes    Quit date: 05/29/2011  . Smokeless tobacco: Never Used  . Alcohol Use: No    Review of Systems 10/14 systems reviewed and are negative other than those stated in the HPI    Allergies  Review of patient's allergies indicates no known allergies.  Home Medications  Prior to Admission medications   Medication Sig Start Date End Date Taking? Authorizing Provider  acetaminophen (TYLENOL) 325 MG tablet Take 650 mg by mouth every 6 (six) hours as needed for moderate pain.   Yes Historical Provider, MD  aspirin 81 MG chewable tablet Chew 1 tablet (81 mg total) by mouth daily. 07/23/14  Yes [name], MD  Cholecalciferol (VITAMIN D) 2000 units tablet Take 1 tablet (2,000 Units total) by mouth daily. 02/23/15  Yes Dorena Dew, FNP  Deferasirox (JADENU) 360 MG TABS Take 1,080 mg by mouth 2 (two) times daily.    Yes Historical Provider, MD  folic acid (FOLVITE) 1 MG tablet Take 1 tablet (1 mg total) by mouth every morning. 02/23/15  Yes Dorena Dew, FNP  gabapentin (NEURONTIN) 300 MG capsule Take 1 capsule (300 mg total) by mouth 3 (three) times daily. 06/01/15  Yes Tresa Garter, MD  HYDROmorphone (DILAUDID) 4 MG tablet Take 1 tablet (4 mg total) by mouth every 4 (four) hours as needed for severe pain. 07/07/15  Yes Tresa Garter, MD  hydroxyurea (HYDREA) 500 MG capsule Take 2 capsules (1,000 mg total) by mouth daily. May take with food to minimize GI side effects. Patient taking differently: Take 1,500 mg by mouth daily. May take with food to minimize GI side effects. 06/01/15  Yes Tresa Garter, MD  morphine (MS CONTIN) 30 MG 12 hr tablet Take 1 tablet (30 mg total) by mouth every 12 (twelve) hours. 07/07/15  Yes Tresa Garter, MD  potassium chloride SA (K-DUR,KLOR-CON) 20 MEQ tablet Take 1 tablet (20 mEq total) by mouth every morning. Patient taking differently: Take 20 mEq by mouth daily.  04/13/15  Yes Dorena Dew, FNP  torsemide (DEMADEX) 20 MG tablet Take 20 mg by mouth daily as needed. For increase in patient weight greater than 3 lbs. 07/27/15 07/26/16 Yes Historical Provider, MD  XARELTO 15 MG TABS tablet TK 1 T PO BID WITH MEALS 07/04/15  Yes Historical Provider, MD  zolpidem (AMBIEN) 10 MG tablet Take 1 tablet (10 mg total) by mouth at bedtime as needed for sleep. 07/13/15  Yes Dorena Dew, FNP   BP 107/78 mmHg  Pulse 98  Temp(Src) 99.2 F (37.3 C) (Oral)  Resp 24  Ht 6' (1.829 m)  Wt 158 lb (71.668 kg)  BMI 21.42 kg/m2  SpO2 93% Physical Exam Physical Exam  Nursing note and vitals reviewed. Constitutional: Chronically ill appearing, non-toxic, and in no acute distress Head: Normocephalic and atraumatic.  Mouth/Throat: Oropharynx is clear and moist.  Neck: Normal range of motion. Neck supple.  Cardiovascular: tacycardic rate and regular rhythm.  bilateral trace edema Pulmonary/Chest: Effort normal and breath sounds normal.  Abdominal: Soft. Mild distension. There is no tenderness. There is  no rebound and no guarding.  Musculoskeletal: Normal range of motion.  Neurological: Alert, no facial droop, fluent speech, moves all extremities symmetrically Skin: Skin is warm and dry.  Psychiatric: Cooperative  ED Course  Procedures (including critical care time) Labs Review Labs Reviewed  CBC WITH DIFFERENTIAL/PLATELET - Abnormal; Notable for the following:    WBC 17.0 (*)    RBC 2.00 (*)    Hemoglobin 6.4 (*)    HCT 19.6 (*)    RDW 23.1 (*)    Platelets 693 (*)    Neutro Abs 12.3 (*)    Monocytes Absolute 2.0 (*)    Basophils Absolute 0.2 (*)    All other components within normal  limits  RETICULOCYTES - Abnormal; Notable for the following:    Retic Ct Pct 7.9 (*)    RBC. 2.00 (*)    All other components within normal limits  BRAIN NATRIURETIC PEPTIDE - Abnormal; Notable for the following:    B Natriuretic Peptide 503.2 (*)    All other components within normal limits  COMPREHENSIVE METABOLIC PANEL  I-STAT TROPOININ, ED  TYPE AND SCREEN    Imaging Review Dg Chest 2 View  08/02/2015  CLINICAL DATA:  Acute onset of shortness of breath and lower chest pain. Initial encounter. EXAM: CHEST  2 VIEW COMPARISON:  Chest radiograph performed 07/18/2015 FINDINGS: The lungs are well-aerated and clear. There is no evidence of focal opacification, pleural effusion or pneumothorax. The heart is enlarged. A left-sided chest port is noted ending about the distal SVC. No acute osseous abnormalities are seen. IMPRESSION: Cardiomegaly.  Lungs remain grossly clear. Electronically Signed   By: Garald Balding M.D.   On: 08/02/2015 22:44   I have personally reviewed and evaluated these images and lab results as part of my medical decision-making.   EKG Interpretation   Date/Time:  Sunday August 02 2015 23:37:23 EDT Ventricular Rate:  108 PR Interval:    QRS Duration: 112 QT Interval:  348 QTC Calculation: 467 R Axis:   -75 Text Interpretation:  Sinus tachycardia Ventricular premature  complex Left  atrial enlargement Borderline IVCD with LAD Consider RVH w/ secondary  repol abnormality Inferior infarct, old Lateral leads are also involved  Similar to prior EKG 07/18/2015 Confirmed by Areona Homer MD, Moksh Loomer [phone]) on  08/02/2015 11:39:34 PM      MDM   Final diagnoses:  Sickle cell crisis ([place])  Chest pain, unspecified chest pain type   History of sickle cell anemia c/b severe pulmonary HTN, bilateral PE on Xarelto and prior ACS who presents with chest pain. States thsi chest pain is similar to typical pain crisis. Documented pulse ox 84% in triage, but this is on room air and he is on chronic 3 L home oxygen. Cardiopulmonary exam unremarkable. Trace LE edema. CXR without acute processes. EKG non-ischemic and troponin is negative. BNP elevated 500, but close to baseline. With leukocytosis, anemia hgb 6.4. Although Dr. Doreene Burke states that his baseline around 6.7, patient states his baseline should be 7.4-7.5 and by [facility]'s note on recent hospitalization, his baseline should be around 7. With appropriate reticulocytosis.  Given 2 mg, 2 mg, and 2.7 mg of dilaudid. Discussed with patient who feels pain is uncontrolled. Will at this time admit to observation for pain control. Type and screen sent and will discuss potential transfusion.    Forde Dandy, MD 08/03/15 [phone]

## 2015-08-02 NOTE — ED Notes (Signed)
Patient o2 sat 84% on Room Air. 93% currently on 3L

## 2015-08-02 NOTE — ED Notes (Signed)
Patient transported to X-ray 

## 2015-08-02 NOTE — ED Notes (Addendum)
EKG completed, unable to print or export due to system being down. RN and EDP aware.

## 2015-08-02 NOTE — ED Notes (Signed)
Patient given sandwich and coke.  

## 2015-08-03 DIAGNOSIS — I10 Essential (primary) hypertension: Secondary | ICD-10-CM | POA: Diagnosis present

## 2015-08-03 DIAGNOSIS — Z87891 Personal history of nicotine dependence: Secondary | ICD-10-CM | POA: Diagnosis not present

## 2015-08-03 DIAGNOSIS — G894 Chronic pain syndrome: Secondary | ICD-10-CM | POA: Diagnosis present

## 2015-08-03 DIAGNOSIS — I2781 Cor pulmonale (chronic): Secondary | ICD-10-CM | POA: Diagnosis present

## 2015-08-03 DIAGNOSIS — Z9981 Dependence on supplemental oxygen: Secondary | ICD-10-CM | POA: Diagnosis not present

## 2015-08-03 DIAGNOSIS — J9611 Chronic respiratory failure with hypoxia: Secondary | ICD-10-CM | POA: Diagnosis not present

## 2015-08-03 DIAGNOSIS — Z86711 Personal history of pulmonary embolism: Secondary | ICD-10-CM | POA: Diagnosis not present

## 2015-08-03 DIAGNOSIS — D5700 Hb-SS disease with crisis, unspecified: Secondary | ICD-10-CM | POA: Diagnosis not present

## 2015-08-03 DIAGNOSIS — I272 Other secondary pulmonary hypertension: Secondary | ICD-10-CM | POA: Diagnosis not present

## 2015-08-03 DIAGNOSIS — D72829 Elevated white blood cell count, unspecified: Secondary | ICD-10-CM | POA: Diagnosis present

## 2015-08-03 DIAGNOSIS — Z79899 Other long term (current) drug therapy: Secondary | ICD-10-CM | POA: Diagnosis not present

## 2015-08-03 DIAGNOSIS — Z86718 Personal history of other venous thrombosis and embolism: Secondary | ICD-10-CM | POA: Diagnosis not present

## 2015-08-03 DIAGNOSIS — Z7901 Long term (current) use of anticoagulants: Secondary | ICD-10-CM | POA: Diagnosis not present

## 2015-08-03 LAB — COMPREHENSIVE METABOLIC PANEL
ALT: 39 U/L (ref 17–63)
AST: 35 U/L (ref 15–41)
Albumin: 4.3 g/dL (ref 3.5–5.0)
Alkaline Phosphatase: 86 U/L (ref 38–126)
Anion gap: 9 (ref 5–15)
BUN: 15 mg/dL (ref 6–20)
CHLORIDE: 109 mmol/L (ref 101–111)
CO2: 22 mmol/L (ref 22–32)
Calcium: 9.2 mg/dL (ref 8.9–10.3)
Creatinine, Ser: 1.22 mg/dL (ref 0.61–1.24)
Glucose, Bld: 121 mg/dL — ABNORMAL HIGH (ref 65–99)
POTASSIUM: 4.3 mmol/L (ref 3.5–5.1)
SODIUM: 140 mmol/L (ref 135–145)
Total Bilirubin: 2.2 mg/dL — ABNORMAL HIGH (ref 0.3–1.2)
Total Protein: 7.7 g/dL (ref 6.5–8.1)

## 2015-08-03 LAB — CBC WITH DIFFERENTIAL/PLATELET
BASOS ABS: 0.2 10*3/uL — AB (ref 0.0–0.1)
BASOS PCT: 1 %
EOS ABS: 0.5 10*3/uL (ref 0.0–0.7)
Eosinophils Relative: 3 %
HCT: 19.6 % — ABNORMAL LOW (ref 39.0–52.0)
Hemoglobin: 6.4 g/dL — CL (ref 13.0–17.0)
LYMPHS PCT: 12 %
Lymphs Abs: 2 10*3/uL (ref 0.7–4.0)
MCH: 32 pg (ref 26.0–34.0)
MCHC: 32.7 g/dL (ref 30.0–36.0)
MCV: 98 fL (ref 78.0–100.0)
MONO ABS: 2 10*3/uL — AB (ref 0.1–1.0)
MONOS PCT: 12 %
NEUTROS PCT: 72 %
Neutro Abs: 12.3 10*3/uL — ABNORMAL HIGH (ref 1.7–7.7)
PLATELETS: 693 10*3/uL — AB (ref 150–400)
RBC: 2 MIL/uL — AB (ref 4.22–5.81)
RDW: 23.1 % — AB (ref 11.5–15.5)
WBC Morphology: INCREASED
WBC: 17 10*3/uL — ABNORMAL HIGH (ref 4.0–10.5)

## 2015-08-03 LAB — BRAIN NATRIURETIC PEPTIDE: B NATRIURETIC PEPTIDE 5: 503.2 pg/mL — AB (ref 0.0–100.0)

## 2015-08-03 LAB — LACTATE DEHYDROGENASE: LDH: 348 U/L — AB (ref 98–192)

## 2015-08-03 LAB — MRSA PCR SCREENING: MRSA BY PCR: NEGATIVE

## 2015-08-03 LAB — RETICULOCYTES
RBC.: 2 MIL/uL — AB (ref 4.22–5.81)
RETIC CT PCT: 7.9 % — AB (ref 0.4–3.1)
Retic Count, Absolute: 158 10*3/uL (ref 19.0–186.0)

## 2015-08-03 MED ORDER — HYDROMORPHONE HCL 2 MG PO TABS
4.0000 mg | ORAL_TABLET | ORAL | Status: DC
  Administered 2015-08-03 – 2015-08-04 (×6): 4 mg via ORAL
  Filled 2015-08-03 (×6): qty 2

## 2015-08-03 MED ORDER — ASPIRIN 81 MG PO CHEW
81.0000 mg | CHEWABLE_TABLET | Freq: Every day | ORAL | Status: DC
  Administered 2015-08-03 – 2015-08-04 (×2): 81 mg via ORAL
  Filled 2015-08-03 (×2): qty 1

## 2015-08-03 MED ORDER — DEXTROSE-NACL 5-0.45 % IV SOLN
INTRAVENOUS | Status: DC
  Administered 2015-08-03 (×2): via INTRAVENOUS

## 2015-08-03 MED ORDER — SENNOSIDES-DOCUSATE SODIUM 8.6-50 MG PO TABS
1.0000 | ORAL_TABLET | Freq: Two times a day (BID) | ORAL | Status: DC
  Administered 2015-08-03 (×3): 1 via ORAL
  Filled 2015-08-03 (×4): qty 1

## 2015-08-03 MED ORDER — DIPHENHYDRAMINE HCL 12.5 MG/5ML PO ELIX: 12.5000 mg | ORAL_SOLUTION | Freq: Four times a day (QID) | ORAL | Status: DC | PRN

## 2015-08-03 MED ORDER — HYDROMORPHONE HCL 2 MG/ML IJ SOLN: 2.0000 mg | INTRAMUSCULAR | Status: DC | PRN

## 2015-08-03 MED ORDER — DEFERASIROX 360 MG PO TABS: 1080.0000 mg | ORAL_TABLET | Freq: Two times a day (BID) | ORAL | Status: DC

## 2015-08-03 MED ORDER — POLYETHYLENE GLYCOL 3350 17 G PO PACK
17.0000 g | PACK | Freq: Every day | ORAL | Status: DC | PRN
  Filled 2015-08-03: qty 1

## 2015-08-03 MED ORDER — ZOLPIDEM TARTRATE 10 MG PO TABS
10.0000 mg | ORAL_TABLET | Freq: Every evening | ORAL | Status: DC | PRN
  Administered 2015-08-03: 10 mg via ORAL
  Filled 2015-08-03: qty 1

## 2015-08-03 MED ORDER — LEVALBUTEROL HCL 1.25 MG/0.5ML IN NEBU
1.2500 mg | INHALATION_SOLUTION | Freq: Four times a day (QID) | RESPIRATORY_TRACT | Status: DC | PRN
  Filled 2015-08-03: qty 0.5

## 2015-08-03 MED ORDER — ONDANSETRON HCL 4 MG/2ML IJ SOLN: 4.0000 mg | Freq: Four times a day (QID) | INTRAMUSCULAR | Status: DC | PRN

## 2015-08-03 MED ORDER — FOLIC ACID 1 MG PO TABS
1.0000 mg | ORAL_TABLET | Freq: Every morning | ORAL | Status: DC
  Administered 2015-08-03 – 2015-08-04 (×2): 1 mg via ORAL
  Filled 2015-08-03 (×2): qty 1

## 2015-08-03 MED ORDER — HYDROMORPHONE 1 MG/ML IV SOLN
INTRAVENOUS | Status: DC
  Administered 2015-08-03: 6.6 mg via INTRAVENOUS
  Administered 2015-08-03: 03:00:00 via INTRAVENOUS
  Administered 2015-08-03: 2.4 mg via INTRAVENOUS
  Administered 2015-08-03: 5.6 mg via INTRAVENOUS
  Administered 2015-08-03: 3.2 mg via INTRAVENOUS
  Administered 2015-08-03: 8.8 mg via INTRAVENOUS
  Administered 2015-08-03: 15:00:00 via INTRAVENOUS
  Filled 2015-08-03 (×2): qty 25

## 2015-08-03 MED ORDER — ACETAMINOPHEN 325 MG PO TABS: 650.0000 mg | ORAL_TABLET | Freq: Four times a day (QID) | ORAL | Status: DC | PRN

## 2015-08-03 MED ORDER — SODIUM CHLORIDE 0.9% FLUSH
10.0000 mL | INTRAVENOUS | Status: DC | PRN
  Administered 2015-08-04: 10 mL
  Filled 2015-08-03: qty 40

## 2015-08-03 MED ORDER — HYDROMORPHONE HCL 2 MG/ML IJ SOLN
2.7000 mg | Freq: Once | INTRAMUSCULAR | Status: AC
  Administered 2015-08-03: 2.7 mg via INTRAVENOUS
  Filled 2015-08-03: qty 2

## 2015-08-03 MED ORDER — VITAMIN D 1000 UNITS PO TABS
2000.0000 [IU] | ORAL_TABLET | Freq: Every day | ORAL | Status: DC
  Administered 2015-08-03 – 2015-08-04 (×2): 2000 [IU] via ORAL
  Filled 2015-08-03 (×2): qty 2

## 2015-08-03 MED ORDER — LEVALBUTEROL HCL 1.25 MG/0.5ML IN NEBU
1.2500 mg | INHALATION_SOLUTION | Freq: Four times a day (QID) | RESPIRATORY_TRACT | Status: DC
  Filled 2015-08-03: qty 0.5

## 2015-08-03 MED ORDER — HYDROXYUREA 500 MG PO CAPS
1500.0000 mg | ORAL_CAPSULE | Freq: Every day | ORAL | Status: DC
  Administered 2015-08-03 – 2015-08-04 (×2): 1500 mg via ORAL
  Filled 2015-08-03 (×2): qty 3

## 2015-08-03 MED ORDER — MORPHINE SULFATE ER 30 MG PO TBCR
30.0000 mg | EXTENDED_RELEASE_TABLET | Freq: Two times a day (BID) | ORAL | Status: DC
  Administered 2015-08-03 – 2015-08-04 (×4): 30 mg via ORAL
  Filled 2015-08-03: qty 1
  Filled 2015-08-03: qty 2
  Filled 2015-08-03 (×2): qty 1

## 2015-08-03 MED ORDER — GABAPENTIN 300 MG PO CAPS
300.0000 mg | ORAL_CAPSULE | Freq: Three times a day (TID) | ORAL | Status: DC
  Administered 2015-08-03 – 2015-08-04 (×4): 300 mg via ORAL
  Filled 2015-08-03 (×4): qty 1

## 2015-08-03 MED ORDER — RIVAROXABAN 20 MG PO TABS
20.0000 mg | ORAL_TABLET | Freq: Every day | ORAL | Status: DC
  Administered 2015-08-04: 20 mg via ORAL
  Filled 2015-08-03: qty 1

## 2015-08-03 MED ORDER — DIPHENHYDRAMINE HCL 50 MG/ML IJ SOLN
12.5000 mg | Freq: Four times a day (QID) | INTRAMUSCULAR | Status: DC | PRN
  Administered 2015-08-03: 12.5 mg via INTRAVENOUS
  Filled 2015-08-03: qty 1

## 2015-08-03 MED ORDER — SODIUM CHLORIDE 0.9% FLUSH: 9.0000 mL | INTRAVENOUS | Status: DC | PRN

## 2015-08-03 MED ORDER — KETOROLAC TROMETHAMINE 15 MG/ML IJ SOLN
15.0000 mg | Freq: Four times a day (QID) | INTRAMUSCULAR | Status: DC
  Administered 2015-08-03 – 2015-08-04 (×7): 15 mg via INTRAVENOUS
  Filled 2015-08-03 (×7): qty 1

## 2015-08-03 MED ORDER — NALOXONE HCL 0.4 MG/ML IJ SOLN: 0.4000 mg | INTRAMUSCULAR | Status: DC | PRN

## 2015-08-03 MED ORDER — RIVAROXABAN 15 MG PO TABS
15.0000 mg | ORAL_TABLET | Freq: Two times a day (BID) | ORAL | Status: DC
  Administered 2015-08-03: 15 mg via ORAL
  Filled 2015-08-03 (×2): qty 1

## 2015-08-03 NOTE — Progress Notes (Signed)
SICKLE CELL SERVICE PROGRESS NOTE  [name] [id number] DOB: [birth year] DOA: 08/02/2015 PCP: [name], MD  Assessment/Plan: Principal Problem:   Sickle cell pain crisis ([place]) Active Problems:   Pulmonary HTN ([place])   Chronic anticoagulation   Hb-SS disease with crisis ([place])   Chronic respiratory failure with hypoxia ([place])   Hx of pulmonary embolus   Hemochromatosis   PAH (pulmonary artery hypertension) (HCC)   Sickle cell anemia ([place])  1. Hb SS with crisis: Will discontinue PCA. Schedule oral dilaudid, continue Toradol and prescribe AS NEEDED clinician assisted doses . If pain continues to be controlled on his regimen will discharge patient tomorrow. 2. Acute in Chronic Respiratory Failure with Hypoxemia: Oxygen at baseline with saturations >92%  On 3 l/min of Oxygen. Will check ambulatory pulse Oximetry. 3. 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 He has recently had Electrophoresis and is scheduled for his next session on 08/11/2015. His LDH is actually lower than his usual baseline which has usually been in the 500's. . He appears clinically stable and I do not see an indication for transfusion at this time. Will check hemoglobin tomorrow prior to discharge.  4. Leukocytosis: Patient has chronically elevated white blood cell count. No evidence of infection. Likely related to bone marrow activity.  5. Chronic Anticoagulation: Pt was recently at [facility] and was resumed on Xarelto and Lovenox discontinued. Of note he was previously on Xarelto and failed therapy while on Xarelto.  6. Chronic Respiratory Failure with Hypoxia: Pt has pulmonary Hypertension and Cor Pulmonale. He was seen by Dr. Christy Sartorius Test at [facility] and therapy with Milda Smart (Ambrisentan)  5 mg was initiated. Pt to follow up with Dr. Karena Addison in July. 7. Proteinuria. Will resume Lisinopril as this was prescribed not foro HTN but rather for reno-vascular  protective effects.  8. Chronic pain: Continue MS Contin and Gabapentin.   Code Status: Full Code Family Communication: N/A Disposition Plan: Anticipate discharge home tomorrow  Helane Briceno A.  Pager [phone]. If 7PM-7AM, please contact night-coverage.  08/03/2015, 4:19 PM  LOS: 0 days   Interim History: Pt reports pain as 4-5/10 and reports that he feels that his pain is well controlled at present. Pt states that he came to the hospital because his pulse ox at home when checked by his girlfriend who is a nurse was in the low 80's. He emphasizes that he did not present because of pain.  Consultants:  None  Procedures:  None  Antibiotics:  None   Objective: Filed Vitals:   08/03/15 1014 08/03/15 1200 08/03/15 1422 08/03/15 1503  BP: 112/67  103/67   Pulse: 95  82   Temp: 98.8 F (37.1 C)  98.5 F (36.9 C)   TempSrc: Oral  Oral   Resp: 20 20 18 21   Height:      Weight:      SpO2: 97% 96% 97% 98%   Weight change:   Intake/Output Summary (Last 24 hours) at 08/03/15 1619 Last data filed at 08/03/15 1422  Gross per 24 hour  Intake   1595 ml  Output    700 ml  Net    895 ml    General: Alert, awake, oriented x3, in no apparent distress.  HEENT: [place]/AT PEERL, EOMI, mild icterus Neck: Trachea midline,  no masses, no thyromegal,y no JVD, no carotid bruit OROPHARYNX:  Moist, No exudate/ erythema/lesions.  Heart: Regular rate and rhythm, without murmurs, rubs, gallops, PMI non-displaced, no  heaves or thrills on palpation.  Lungs: Clear to auscultation, no wheezing or rhonchi noted. No increased vocal fremitus resonant to percussion  Abdomen: Soft, non-tender,  nondistended, positive bowel sounds, no masses no hepatosplenomegaly noted.  Neuro: No focal neurological deficits noted cranial nerves II through XII grossly intact.  Strength at functional baseline in bilateral upper and lower extremities. Musculoskeletal: No warmth swelling or erythema around joints, no  spinal tenderness noted. Psychiatric: Patient alert and oriented x3, good insight and cognition, good recent to remote recall.    Data Reviewed: Basic Metabolic Panel:  Recent Labs Lab 07/30/15 0920 08/02/15 2307  NA 136 140  K 4.2 4.3  CL 107 109  CO2 24 22  GLUCOSE 107* 121*  BUN 15 15  CREATININE 0.57* 1.22  CALCIUM 9.4 9.2   Liver Function Tests:  Recent Labs Lab 07/30/15 0920 08/02/15 2307  AST 47* 35  ALT 36 39  ALKPHOS 82 86  BILITOT 2.9* 2.2*  PROT 8.0 7.7  ALBUMIN 4.3 4.3   No results for input(s): LIPASE, AMYLASE in the last 168 hours. No results for input(s): AMMONIA in the last 168 hours. CBC:  Recent Labs Lab 07/30/15 0920 08/02/15 2307  WBC 9.4 17.0*  NEUTROABS 6.4 12.3*  HGB 6.7* 6.4*  HCT 20.8* 19.6*  MCV 95.0 98.0  PLT 526* 693*   Cardiac Enzymes: No results for input(s): CKTOTAL, CKMB, CKMBINDEX, TROPONINI in the last 168 hours. BNP (last 3 results)  Recent Labs  09/10/14 0612 11/30/14 0910 08/02/15 2307  BNP 549.5* 639.7* 503.2*    ProBNP (last 3 results) No results for input(s): PROBNP in the last 8760 hours.  CBG: No results for input(s): GLUCAP in the last 168 hours.  Recent Results (from the past 240 hour(s))  MRSA PCR Screening     Status: None   Collection Time: 08/03/15  3:15 AM  Result Value Ref Range Status   MRSA by PCR NEGATIVE NEGATIVE Final    Comment:        The GeneXpert MRSA Assay (FDA approved for NASAL specimens only), is one component of a comprehensive MRSA colonization surveillance program. It is not intended to diagnose MRSA infection nor to guide or monitor treatment for MRSA infections.      Studies: Dg Chest 2 View  08/02/2015  CLINICAL DATA:  Acute onset of shortness of breath and lower chest pain. Initial encounter. EXAM: CHEST  2 VIEW COMPARISON:  Chest radiograph performed 07/18/2015 FINDINGS: The lungs are well-aerated and clear. There is no evidence of focal opacification, pleural  effusion or pneumothorax. The heart is enlarged. A left-sided chest port is noted ending about the distal SVC. No acute osseous abnormalities are seen. IMPRESSION: Cardiomegaly.  Lungs remain grossly clear. Electronically Signed   By: Garald Balding M.D.   On: 08/02/2015 22:44   Dg Chest 2 View  07/18/2015  CLINICAL DATA:  Short of breath today. History of sickle cell disease. EXAM: CHEST  2 VIEW COMPARISON:  07/17/2015 FINDINGS: There is moderate enlargement of the cardiac silhouette, unchanged. No mediastinal or hilar masses or evidence of adenopathy. Mild right lung base scarring. There are prominent bronchovascular markings, but no evidence of pneumonia or pulmonary edema. No pleural effusion or pneumothorax. Left anterior chest wall Port-A-Cath is stable. Bony thorax is intact. No change from the prior study. IMPRESSION: No acute cardiopulmonary disease. Electronically Signed   By: Lajean Manes M.D.   On: 07/18/2015 16:08   Dg Chest 2 View  07/17/2015  CLINICAL  DATA:  Sickle cell crisis with pain and shortness of Breath EXAM: CHEST  2 VIEW COMPARISON:  07/09/2015 FINDINGS: Cardiac shadow is again enlarged. Left chest wall port is again seen and stable. No focal infiltrate or sizable effusion is noted. No acute bony abnormality is seen. IMPRESSION: No acute abnormality noted. Electronically Signed   By: Inez Catalina M.D.   On: 07/17/2015 14:50   Dg Chest 2 View  07/08/2015  CLINICAL DATA:  Shortness of breath,, hypoxia, history of sickle cell EXAM: CHEST  2 VIEW COMPARISON:  07/07/2015 FINDINGS: Cardiomegaly again noted. Left subclavian Port-A-Cath is unchanged in position. No acute infiltrate or pleural effusion. No pulmonary edema. IMPRESSION: No active cardiopulmonary disease. Electronically Signed   By: Lahoma Crocker M.D.   On: 07/08/2015 11:01   X-ray Chest Pa And Lateral  07/07/2015  CLINICAL DATA:  Shortness of breath, history of sickle cell EXAM: CHEST  2 VIEW COMPARISON:  07/06/2015 FINDINGS:  Moderate cardiac silhouette enlargement stable. Minimal vascular congestion. No pleural effusion. No consolidation. IMPRESSION: Cardiac enlargement stable with development of very mild vascular congestion. Electronically Signed   By: Skipper Cliche M.D.   On: 07/07/2015 11:27   Dg Chest 2 View  07/06/2015  CLINICAL DATA:  Sickle cell crisis with chest pain and shortness of breath today. EXAM: CHEST  2 VIEW COMPARISON:  Chest x-ray dated 06/19/2015. FINDINGS: Cardiomegaly is stable. Left chest wall Port-A-Cath is stable in position with tip overlying the expected location of the cavoatrial junction. Pulmonary vasculature remains mildly prominent, without frank pulmonary edema. Suspect some degree of chronic pulmonary artery hypertension. No evidence of pneumonia. No pleural effusion or pneumothorax seen. Osseous structures about the chest are unremarkable. IMPRESSION: Stable cardiomegaly.  No acute findings. Electronically Signed   By: Franki Cabot M.D.   On: 07/06/2015 12:50   Dg Chest Port 1 View  07/09/2015  CLINICAL DATA:  Shortness of breath.  Sickle cell disease EXAM: PORTABLE CHEST 1 VIEW COMPARISON:  July 08, 2015 FINDINGS: Port-A-Cath tip is in the superior cava. No pneumothorax. There is no edema or consolidation. There is generalized cardiac enlargement. There is pulmonary venous hypertension. No adenopathy evident. No bone lesions. IMPRESSION: Cardiomegaly with pulmonary vascular congestion.  No consolidation. Electronically Signed   By: Lowella Grip III M.D.   On: 07/09/2015 08:04    Scheduled Meds: . aspirin  81 mg Oral Daily  . cholecalciferol  2,000 Units Oral Daily  . Deferasirox  1,080 mg Oral BID  . folic acid  1 mg Oral q morning - 10a  . gabapentin  300 mg Oral TID  . HYDROmorphone  4 mg Oral Q4H  . hydroxyurea  1,500 mg Oral Daily  . ketorolac  15 mg Intravenous Q6H  . morphine  30 mg Oral Q12H  . [START ON 08/04/2015] rivaroxaban  20 mg Oral Q supper  . senna-docusate  1  tablet Oral BID   Continuous Infusions: . dextrose 5 % and 0.45% NaCl 100 mL/hr at 08/03/15 1314    Time spent 30 minurtes

## 2015-08-03 NOTE — H&P (Signed)
History and Physical    [name] [id number] DOB: 08/14/1980 DOA: 08/02/2015  Referring MD/NP/PA:   PCP: [name], MD   Patient coming from:  The patient is coming from home.  At baseline, pt is independent for most of ADL.   Chief Complaint: Chest pain, right arm pain and shortness of breath  HPI: [name] is a 35 y.o. male with medical history significant of sickle cell disease, acute chest syndrome, DVT, PE on Xarelto, pulmonary hypertension, cor pulmonale, chronic respiratory failure, C diff colitis, who presents with chest pain, right arm pain and shortness breath.  Patient reports that he started having chest pain and right arm pain since last night. The chest pain is located in bilateral lower chest, constant, 7 out of 10 in severity, nonradiating. He also has severe right arm pain. Patient states that this is typical for his sickle cell pain crisis.  Patient has chronic shortness of breath due to chronic respiratory failure secondary to pulmonary hypertension and Cor pulmonale. He states that he has a shortness of breath, but no cough, fever or chills. He has oxygen desaturated to 84% on room air in ED, which responded to nasal cannula quickly, back up 93%. Patient does not have nausea, vomiting, diarrhea, abdominal pain, symptoms of a UTI or unilateral weakness.  ED Course: pt was found to have hemoglobin 6.4 which was 6.7 on 07/30/15, WBC 17.0, BNP 503, temperature 99.2, tachycardia, tachypnea, potassium normal, chest x-ray without infiltration. Patient is admitted to inpatient for further eval and treatment.  Review of Systems:   General: no fevers, chills, no changes in body weight, has poor appetite, has fatigue HEENT: no blurry vision, hearing changes or sore throat Pulm: has dyspnea, no coughing, wheezing CV: has chest pain, no palpitations Abd: no nausea, vomiting, abdominal pain, diarrhea, constipation GU: no dysuria, burning on urination,  increased urinary frequency, hematuria  Ext: no leg edema Neuro: no unilateral weakness, numbness, or tingling, no vision change or hearing loss Skin: no rash MSK: No muscle spasm, no deformity, no limitation of range of movement in spin. Has right arm pain. Heme: No easy bruising.  Travel history: No recent long distant travel.  Allergy: No Known Allergies  Past Medical History  Diagnosis Date  . Sickle cell anemia (HCC)   . Blood transfusion   . Acute embolism and thrombosis of right internal jugular vein ([place])   . Hypokalemia   . Mood disorder ([place])   . History of pulmonary embolus (PE)   . Avascular necrosis (HCC)     Right Hip  . Leukocytosis     Chronic  . Thrombocytosis (HCC)     Chronic  . Hypertension   . History of Clostridium difficile infection   . Uses marijuana   . Chronic anticoagulation   . Functional asplenia   . Former smoker   . Second hand tobacco smoke exposure   . Alcohol consumption of one to four drinks per day   . Noncompliance with medication regimen   . Sickle-cell crisis with associated acute chest syndrome ([place]) 05/13/2013  . Acute chest syndrome ([place]) 06/18/2013  . Demand ischemia ([place]) 01/02/2014  . Pulmonary hypertension ([place])   . Hb-SS disease with crisis ([place])   . Oxygen deficiency     Past Surgical History  Procedure Laterality Date  . Right hip replacement      08/2006  . Cholecystectomy      01/2008  . Porta cath placement    .  Porta cath removal    . Umbilical hernia repair      01/2008  . Excision of left periauricular cyst      10/2009  . Excision of right ear lobe cyst with primary closur      11/2007  . Portacath placement  01/05/2012    Procedure: INSERTION PORT-A-CATH;  Surgeon: Odis Hollingshead, MD;  Location: [facility];  Service: General;  Laterality: N/A;  ultrasound guiced port a cath insertion with fluoroscopy    Social History:  reports that he quit smoking about 4 years ago. His smoking use included Cigarettes. He  has a 5 pack-year smoking history. He has never used smokeless tobacco. He reports that he does not drink alcohol or use illicit drugs.  Family History:  Family History  Problem Relation Age of Onset  . Sickle cell trait Mother   . Depression Mother   . Diabetes Mother   . Sickle cell trait Father   . Sickle cell trait Brother      Prior to Admission medications   Medication Sig Start Date End Date Taking? Authorizing Provider  acetaminophen (TYLENOL) 325 MG tablet Take 650 mg by mouth every 6 (six) hours as needed for moderate pain.   Yes Historical Provider, MD  aspirin 81 MG chewable tablet Chew 1 tablet (81 mg total) by mouth daily. 07/23/14  Yes [name], MD  Cholecalciferol (VITAMIN D) 2000 units tablet Take 1 tablet (2,000 Units total) by mouth daily. 02/23/15  Yes Dorena Dew, FNP  Deferasirox (JADENU) 360 MG TABS Take 1,080 mg by mouth 2 (two) times daily.    Yes Historical Provider, MD  folic acid (FOLVITE) 1 MG tablet Take 1 tablet (1 mg total) by mouth every morning. 02/23/15  Yes Dorena Dew, FNP  gabapentin (NEURONTIN) 300 MG capsule Take 1 capsule (300 mg total) by mouth 3 (three) times daily. 06/01/15  Yes Tresa Garter, MD  HYDROmorphone (DILAUDID) 4 MG tablet Take 1 tablet (4 mg total) by mouth every 4 (four) hours as needed for severe pain. 07/07/15  Yes Tresa Garter, MD  hydroxyurea (HYDREA) 500 MG capsule Take 2 capsules (1,000 mg total) by mouth daily. May take with food to minimize GI side effects. Patient taking differently: Take 1,500 mg by mouth daily. May take with food to minimize GI side effects. 06/01/15  Yes Tresa Garter, MD  morphine (MS CONTIN) 30 MG 12 hr tablet Take 1 tablet (30 mg total) by mouth every 12 (twelve) hours. 07/07/15  Yes Tresa Garter, MD  potassium chloride SA (K-DUR,KLOR-CON) 20 MEQ tablet Take 1 tablet (20 mEq total) by mouth every morning. Patient taking differently: Take 20 mEq by mouth daily.  04/13/15   Yes Dorena Dew, FNP  torsemide (DEMADEX) 20 MG tablet Take 20 mg by mouth daily as needed. For increase in patient weight greater than 3 lbs. 07/27/15 07/26/16 Yes Historical Provider, MD  XARELTO 15 MG TABS tablet TK 1 T PO BID WITH MEALS 07/04/15  Yes Historical Provider, MD  zolpidem (AMBIEN) 10 MG tablet Take 1 tablet (10 mg total) by mouth at bedtime as needed for sleep. 07/13/15  Yes Dorena Dew, FNP    Physical Exam: Filed Vitals:   08/03/15 0020 08/03/15 0030 08/03/15 0100 08/03/15 0130  BP: 110/70 107/78 125/80 120/73  Pulse: 101 98 101 100  Temp:      TempSrc:      Resp: 24 24 19 14   Height:  Weight:      SpO2: 91% 93% 91% 90%   General: Not in acute distress HEENT:       Eyes: PERRL, EOMI, no scleral icterus.       ENT: No discharge from the ears and nose, no pharynx injection, no tonsillar enlargement.        Neck: No JVD, no bruit, no mass felt. Heme: No neck lymph node enlargement. Cardiac: S1/S2, RRR. Tachycardia. No murmurs, No gallops or rubs. Pulm: No rales, wheezing, rhonchi or rubs. Abd: Soft, nondistended, nontender, no rebound pain, no organomegaly, BS present. GU: No hematuria Ext: No pitting leg edema bilaterally. 2+DP/PT pulse bilaterally. Musculoskeletal: No joint deformities, No joint redness or warmth, no limitation of ROM in spin. Skin: No rashes.  Neuro: Alert, oriented X3, cranial nerves II-XII grossly intact, moves all extremities normally. Psych: Patient is not psychotic, no suicidal or hemocidal ideation.  Labs on Admission: I have personally reviewed following labs and imaging studies  CBC:  Recent Labs Lab 07/30/15 0920 08/02/15 2307  WBC 9.4 17.0*  NEUTROABS 6.4 12.3*  HGB 6.7* 6.4*  HCT 20.8* 19.6*  MCV 95.0 98.0  PLT 526* [id number]*   Basic Metabolic Panel:  Recent Labs Lab 07/30/15 0920 08/02/15 2307  NA 136 140  K 4.2 4.3  CL 107 109  CO2 24 22  GLUCOSE 107* 121*  BUN 15 15  CREATININE 0.57* 1.22  CALCIUM 9.4  9.2   GFR: Estimated Creatinine Clearance: 84.9 mL/min (by C-G formula based on Cr of 1.22). Liver Function Tests:  Recent Labs Lab 07/30/15 0920 08/02/15 2307  AST 47* 35  ALT 36 39  ALKPHOS 82 86  BILITOT 2.9* 2.2*  PROT 8.0 7.7  ALBUMIN 4.3 4.3   No results for input(s): LIPASE, AMYLASE in the last 168 hours. No results for input(s): AMMONIA in the last 168 hours. Coagulation Profile: No results for input(s): INR, PROTIME in the last 168 hours. Cardiac Enzymes: No results for input(s): CKTOTAL, CKMB, CKMBINDEX, TROPONINI in the last 168 hours. BNP (last 3 results) No results for input(s): PROBNP in the last 8760 hours. HbA1C: No results for input(s): HGBA1C in the last 72 hours. CBG: No results for input(s): GLUCAP in the last 168 hours. Lipid Profile: No results for input(s): CHOL, HDL, LDLCALC, TRIG, CHOLHDL, LDLDIRECT in the last 72 hours. Thyroid Function Tests: No results for input(s): TSH, T4TOTAL, FREET4, T3FREE, THYROIDAB in the last 72 hours. Anemia Panel:  Recent Labs  08/02/15 2307  RETICCTPCT 7.9*   Urine analysis:    Component Value Date/Time   COLORURINE YELLOW 07/18/2015 1550   APPEARANCEUR CLEAR 07/18/2015 1550   LABSPEC 1.013 07/18/2015 1550   PHURINE 6.5 07/18/2015 1550   GLUCOSEU NEGATIVE 07/18/2015 1550   HGBUR NEGATIVE 07/18/2015 1550   BILIRUBINUR NEGATIVE 07/18/2015 1550   KETONESUR NEGATIVE 07/18/2015 1550   PROTEINUR NEGATIVE 07/18/2015 1550   UROBILINOGEN 0.2 03/24/2015 1426   NITRITE NEGATIVE 07/18/2015 1550   LEUKOCYTESUR NEGATIVE 07/18/2015 1550   Sepsis Labs: @LABRCNTIP (procalcitonin:4,lacticidven:4) )No results found for this or any previous visit (from the past 240 hour(s)).   Radiological Exams on Admission: Dg Chest 2 View  08/02/2015  CLINICAL DATA:  Acute onset of shortness of breath and lower chest pain. Initial encounter. EXAM: CHEST  2 VIEW COMPARISON:  Chest radiograph performed 07/18/2015 FINDINGS: The lungs are  well-aerated and clear. There is no evidence of focal opacification, pleural effusion or pneumothorax. The heart is enlarged. A left-sided chest port is noted ending  about the distal SVC. No acute osseous abnormalities are seen. IMPRESSION: Cardiomegaly.  Lungs remain grossly clear. Electronically Signed   By: Garald Balding M.D.   On: 08/02/2015 22:44     EKG: Independently reviewed. Sinus rhythm, QTC 467, LAD, T-wave inversion in V2-5, which existed in the previous EKG on 07/18/15.  Assessment/Plan Principal Problem:   Sickle cell pain crisis ([place]) Active Problems:   Hx of pulmonary embolus   Pulmonary HTN (HCC)   Chronic anticoagulation   Hemochromatosis   Hb-SS disease with crisis ([place])   Chronic respiratory failure with hypoxia (HCC)   PAH (pulmonary artery hypertension) (HCC)   Sickle cell anemia (HCC)   Sickle cell pain crisis [facility]): Patient's chest pain and right arm pain are likely caused by sickle cell pain crisis. Patient states that this is typical for his sickle cell pain crisis. Chest x-ray is negative and no fever, less likely to have acute chest syndrome. Hemoglobin is close to the baseline 6.7 on 6/29/7-->6.4 today. Will hold off blood transfusion.  -Admit to tele bed given tachycardia. -Hold transfusion now -continue folic acid and hydroxyurea -continue Jadenu for secondary Hemochromatosis -Benadryl for itch -IVF: D5-1/2NS at 100 cc/h -Zofran for nausea -Check LDH -Start high dose PCA protocol for pain: loading dose 1.5 mg, bolus dose 0.8 mg, lockout interval 10 min and one hour dose limit at 4.8 mg. - continue home MS Contin - start Toradol, 50 mg every 6 hours  Chronic respiratory failure secondary to pulmonary hypertension and Cor pulmonale: pt has SOB, but no fever or chill. X-rays negative for infiltration. His BNP is chronically elevated, BNP 639.7 on 11/30/14-->503 today, no leg edema or JVD, cor pulmonale seems to be not exacerbated. -When necessary Xopenex  nebulizer for shortness of breath -Nasal cannula oxygen to maintain oxygen saturation -hold torsemide since patient needs IV fluid for sickle cell pain crisis  Hx of pulmonary embolus and DVT: -continue Xarelto   DVT ppx:  On Xarelto Code Status: Full code Family Communication: None at bed side.  Disposition Plan:  Anticipate discharge back to previous home environment Consults called:  none Admission status: Obs / tele  Date of Service 08/03/2015    Ivor Costa [facility] Pager [phone]  If 7PM-7AM, please contact night-coverage [email or web address] Password [email or web address] 08/03/2015, 1:44 AM

## 2015-08-04 DIAGNOSIS — I272 Other secondary pulmonary hypertension: Secondary | ICD-10-CM

## 2015-08-04 DIAGNOSIS — D5700 Hb-SS disease with crisis, unspecified: Principal | ICD-10-CM

## 2015-08-04 DIAGNOSIS — Z7901 Long term (current) use of anticoagulants: Secondary | ICD-10-CM

## 2015-08-04 DIAGNOSIS — J9611 Chronic respiratory failure with hypoxia: Secondary | ICD-10-CM

## 2015-08-04 LAB — BASIC METABOLIC PANEL
Anion gap: 6 (ref 5–15)
BUN: 18 mg/dL (ref 6–20)
CALCIUM: 8.5 mg/dL — AB (ref 8.9–10.3)
CO2: 24 mmol/L (ref 22–32)
CREATININE: 0.85 mg/dL (ref 0.61–1.24)
Chloride: 108 mmol/L (ref 101–111)
GFR calc Af Amer: >60 mL/min (ref >60)
GFR calc non Af Amer: >60 mL/min (ref >60)
GLUCOSE: 108 mg/dL — AB (ref 65–99)
Potassium: 4.5 mmol/L (ref 3.5–5.1)
Sodium: 138 mmol/L (ref 135–145)

## 2015-08-04 LAB — CBC WITH DIFFERENTIAL/PLATELET
BASOS ABS: 0.1 10*3/uL (ref 0.0–0.1)
Basophils Relative: 1 %
EOS ABS: 0.6 10*3/uL (ref 0.0–0.7)
Eosinophils Relative: 5 %
HCT: 19.2 % — ABNORMAL LOW (ref 39.0–52.0)
Hemoglobin: 6.4 g/dL — CL (ref 13.0–17.0)
LYMPHS ABS: 0.8 10*3/uL (ref 0.7–4.0)
LYMPHS PCT: 7 %
MCH: 32.3 pg (ref 26.0–34.0)
MCHC: 33.3 g/dL (ref 30.0–36.0)
MCV: 97 fL (ref 78.0–100.0)
Monocytes Absolute: 1.3 10*3/uL — ABNORMAL HIGH (ref 0.1–1.0)
Monocytes Relative: 12 %
NEUTROS ABS: 8.2 10*3/uL — AB (ref 1.7–7.7)
Neutrophils Relative %: 75 %
Platelets: 649 10*3/uL — ABNORMAL HIGH (ref 150–400)
RBC: 1.98 MIL/uL — ABNORMAL LOW (ref 4.22–5.81)
RDW: 24.4 % — AB (ref 11.5–15.5)
WBC: 11 10*3/uL — AB (ref 4.0–10.5)

## 2015-08-04 LAB — RETICULOCYTES
RBC.: 1.98 MIL/uL — AB (ref 4.22–5.81)
RETIC CT PCT: 9.5 % — AB (ref 0.4–3.1)
Retic Count, Absolute: 188.1 10*3/uL — ABNORMAL HIGH (ref 19.0–186.0)

## 2015-08-04 IMAGING — CR DG CHEST 2V
2 series · 2 of 2 positions shown · non-contrast
Comparison: 08/18/2012.

CLINICAL DATA: Sickle cell pain crisis.

CHEST - 2 VIEW

[w chest pa]
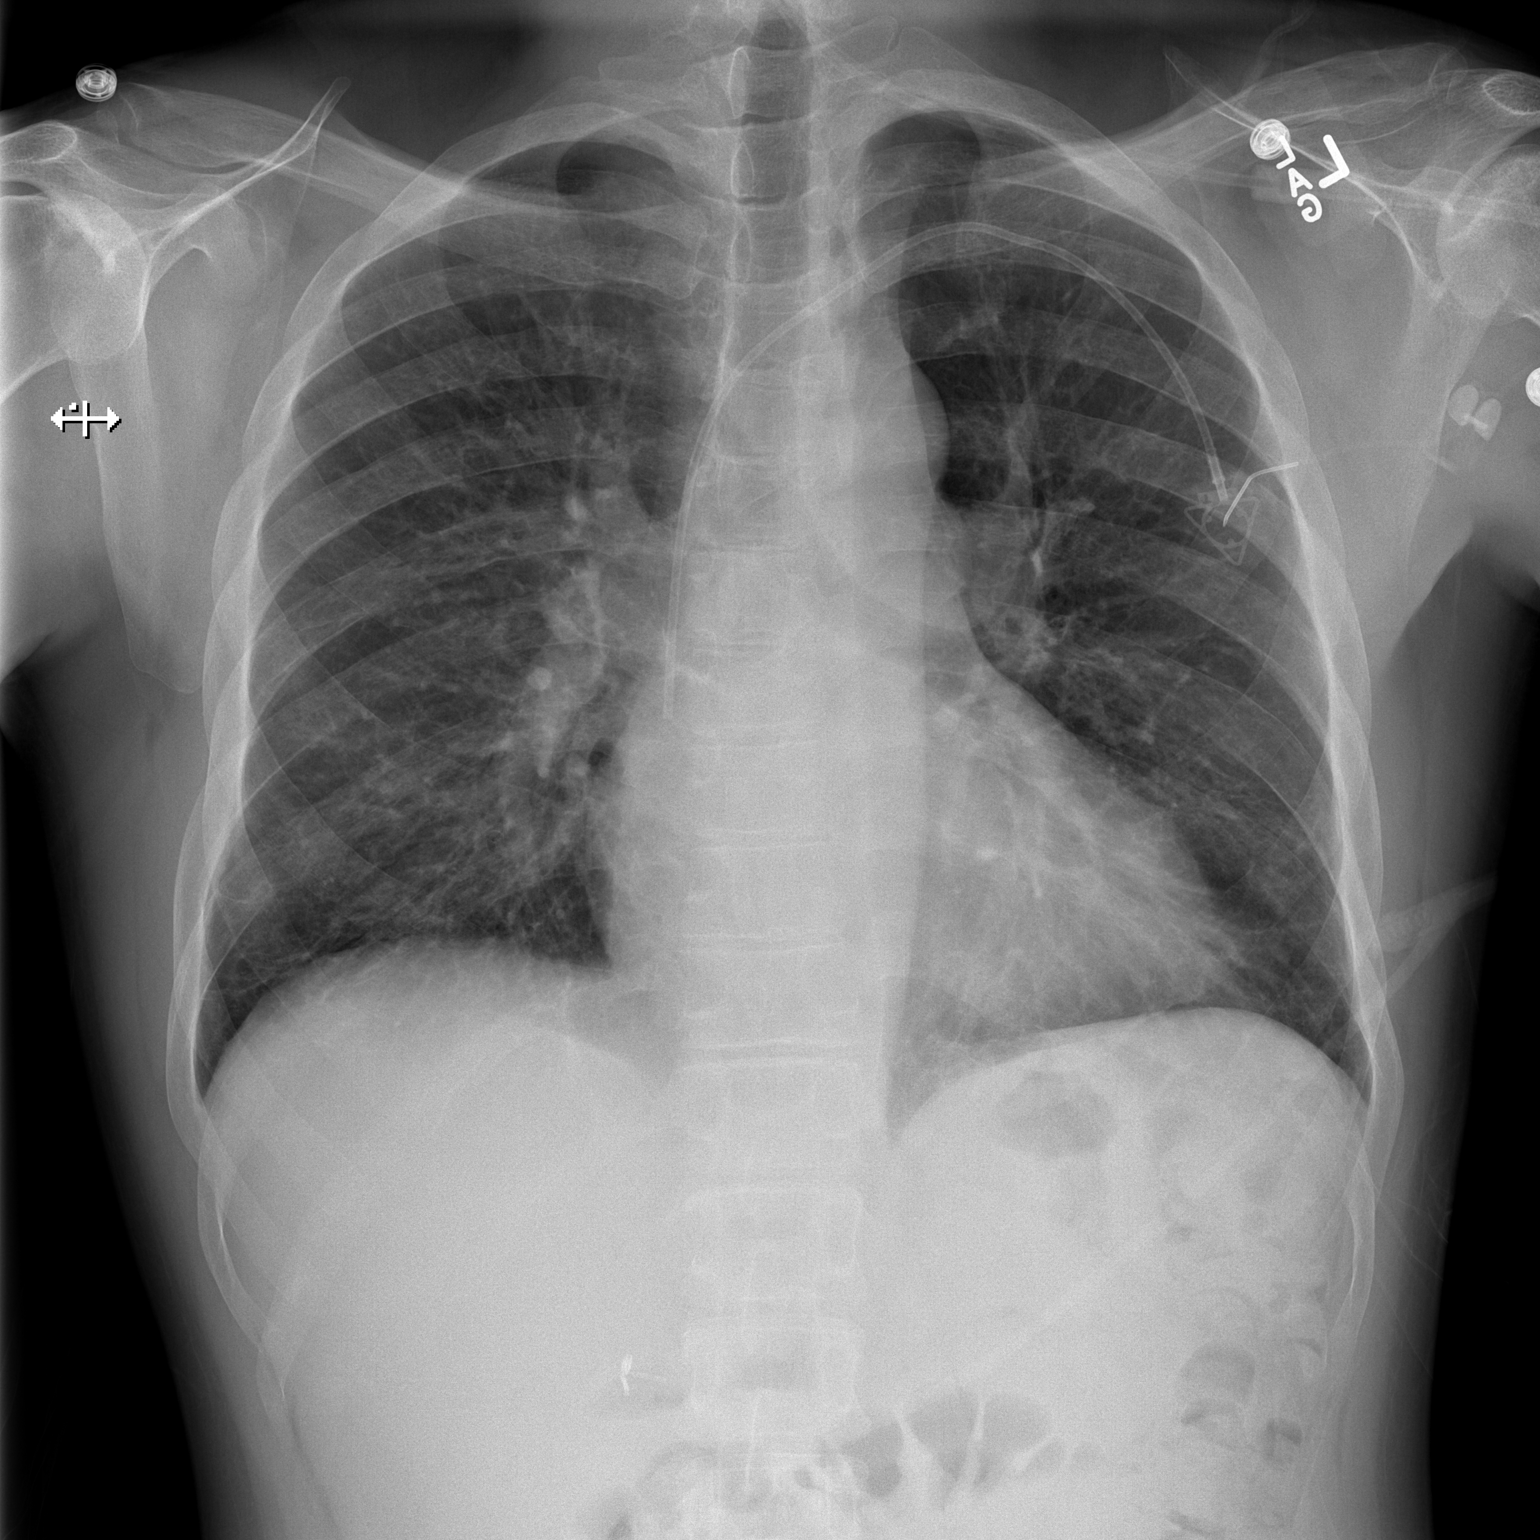

[w chest lat]
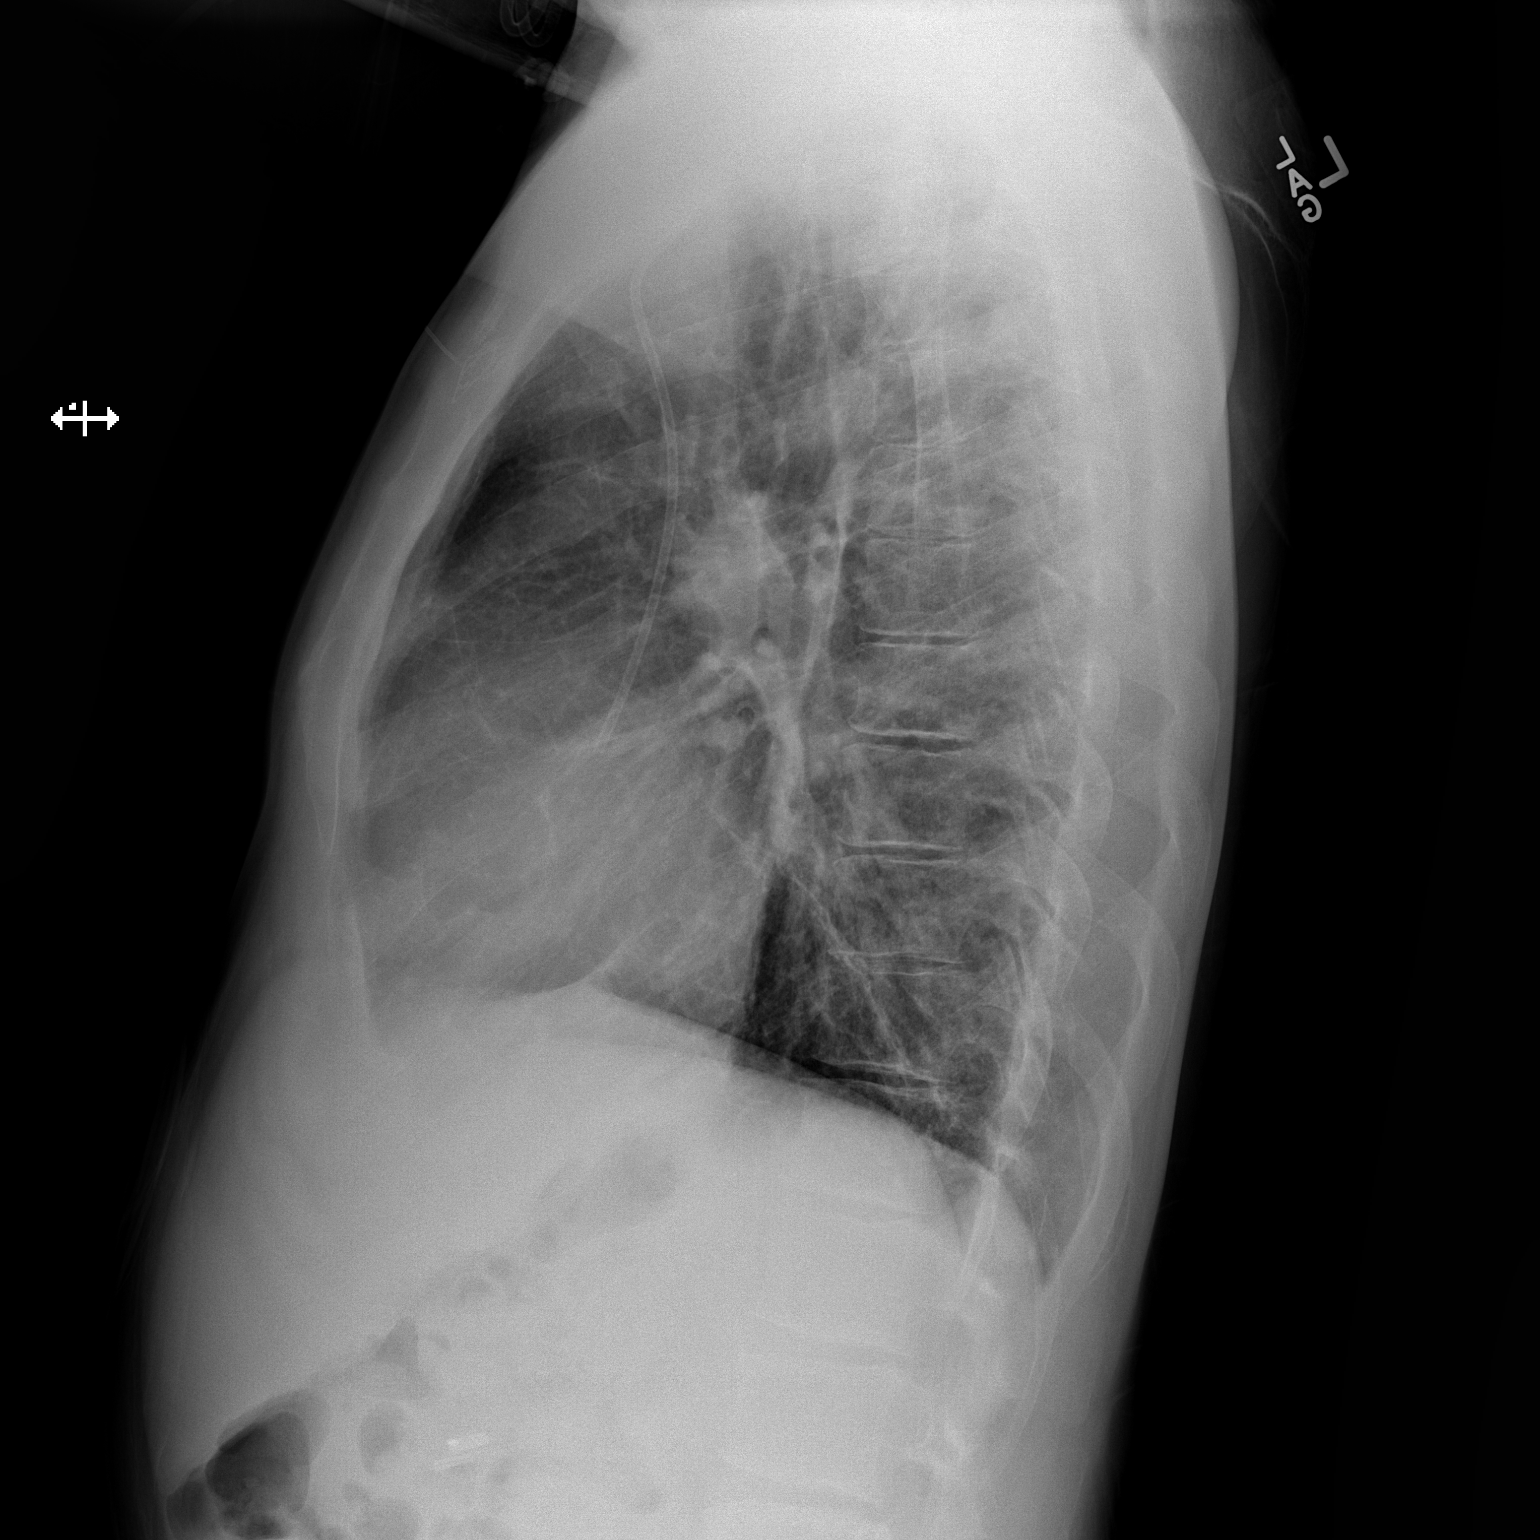

[2 of 2 positions shown; findings below may reference images not displayed]

FINDINGS: Unchanged appearance of left subclavian portacatheter,
tip at the superior cavoatrial junction.

Stable osseous findings, including bilateral proximal humeral
osteonecrosis.

Chronic cardiomegaly.  Unremarkable upper mediastinal contours.

Coarsened interstitial markings without edema or infiltrate.  No
effusion or pneumothorax.
IMPRESSION: Chronic interstitial changes and cardiomegaly.  No evidence of
acute cardiopulmonary disease.

## 2015-08-04 MED ORDER — HEPARIN SOD (PORK) LOCK FLUSH 100 UNIT/ML IV SOLN
500.0000 [IU] | INTRAVENOUS | Status: AC | PRN
  Administered 2015-08-04: 500 [IU]

## 2015-08-04 MED ORDER — LISINOPRIL 5 MG PO TABS: 5.0000 mg | ORAL_TABLET | Freq: Every day | ORAL | Status: DC

## 2015-08-04 NOTE — Discharge Summary (Signed)
[name] MRN: [id number] DOB/AGE: Jul 22, 1980 35 y.o.  Admit date: 08/02/2015 Discharge date: 08/04/2015  Primary Care Physician:  [name], MD   Discharge Diagnoses:   Patient Active Problem List   Diagnosis Date Noted  . Cor pulmonale, chronic ([place]) 08/25/2014    Priority: High  . Pulmonary HTN ([place]) 06/18/2013    Priority: High  . Functional asplenia     Priority: High  . Leukocytosis 02/25/2015    Priority: Medium  . Anemia of chronic disease 06/25/2014    Priority: Medium  . Chronic respiratory failure with hypoxia ([place]) 03/14/2014    Priority: Medium  . Hb-SS disease with crisis ([place]) 01/22/2014    Priority: Medium  . Chronic anticoagulation 08/22/2013    Priority: Medium  . Avascular necrosis ([place])     Priority: Low  . Anemia requiring transfusions 07/17/2015  . Pulmonary embolism without acute cor pulmonale ([place])   . Acute upper respiratory infection 06/09/2015  . Cough   . Fever   . Sickle cell anemia with pain ([place]) 05/07/2015  . Sickle cell anemia with crisis ([place]) 04/26/2015  . AKI (acute kidney injury) ([place])   . Hyperkalemia   . Hypoxia   . Leucocytosis   . Sickle-cell disease with pain ([place]) 04/13/2015  . Sickle cell pain crisis ([place]) 03/30/2015  . On home oxygen therapy 02/23/2015  . Anemia 01/31/2015  . Symptomatic anemia   . Syncope 11/30/2014  . Hb-SS disease without crisis ([place]) 10/07/2014  . Acute respiratory failure with hypoxia ([place])   . Acute chest syndrome in sickle crisis ([place])   . PVC's (premature ventricular contractions) 09/10/2014  . Abnormal EKG 09/10/2014  . Hypoxemia   . Chronic atrial fibrillation ([place]) 09/09/2014  . Sickle cell crisis ([place]) 09/08/2014  . Bacteremia   . Elevated troponin I level 08/29/2014  . Ankle edema 07/07/2014  . Sickle cell anemia ([place]) 06/25/2014  . PAH (pulmonary artery hypertension) ([place]) 03/18/2014  . Paralytic strabismus, external ophthalmoplegia   . Chronic pain syndrome 12/12/2013   . Essential hypertension 08/22/2013  . Vitamin D deficiency 02/13/2013  . Embolism, pulmonary with infarction ([place]) 07/09/2012  . Hx of pulmonary embolus 06/29/2012  . Hemochromatosis 12/14/2011    DISCHARGE MEDICATION:   Medication List    TAKE these medications        acetaminophen 325 MG tablet  Commonly known as:  TYLENOL  Take 650 mg by mouth every 6 (six) hours as needed for moderate pain.     aspirin 81 MG chewable tablet  Chew 1 tablet (81 mg total) by mouth daily.     folic acid 1 MG tablet  Commonly known as:  FOLVITE  Take 1 tablet (1 mg total) by mouth every morning.     gabapentin 300 MG capsule  Commonly known as:  NEURONTIN  Take 1 capsule (300 mg total) by mouth 3 (three) times daily.     HYDROmorphone 4 MG tablet  Commonly known as:  DILAUDID  Take 1 tablet (4 mg total) by mouth every 4 (four) hours as needed for severe pain.     hydroxyurea 500 MG capsule  Commonly known as:  HYDREA  Take 2 capsules (1,000 mg total) by mouth daily. May take with food to minimize GI side effects.     JADENU 360 MG Tabs  Generic drug:  Deferasirox  Take 1,080 mg by mouth 2 (two) times daily.     morphine 30 MG 12 hr tablet  Commonly known as:  MS CONTIN  Take 1 tablet (30 mg total) by mouth every 12 (twelve) hours.     Lisinopril 5 MG tablets  Commonly known as: PRINIVIL,ZESTRIL  Take 1 tablet (5 mg) by mouth daily  potassium chloride SA 20 MEQ tablet  Commonly known as:  K-DUR,KLOR-CON  Take 1 tablet (20 mEq total) by mouth every morning.     rivaroxaban 20 MG Tabs tablet  Commonly known as:  XARELTO  Take 20 mg by mouth daily with supper.     torsemide 20 MG tablet  Commonly known as:  DEMADEX  Take 20 mg by mouth daily as needed. For increase in patient weight greater than 3 lbs.     Vitamin D 2000 units tablet  Take 1 tablet (2,000 Units total) by mouth daily.     zolpidem 10 MG tablet  Commonly known as:  AMBIEN  Take 1 tablet (10 mg total) by  mouth at bedtime as needed for sleep.          SIGNIFICANT DIAGNOSTIC STUDIES:  Dg Chest 2 View  08/02/2015  CLINICAL DATA:  Acute onset of shortness of breath and lower chest pain. Initial encounter. EXAM: CHEST  2 VIEW COMPARISON:  Chest radiograph performed 07/18/2015 FINDINGS: The lungs are well-aerated and clear. There is no evidence of focal opacification, pleural effusion or pneumothorax. The heart is enlarged. A left-sided chest port is noted ending about the distal SVC. No acute osseous abnormalities are seen. IMPRESSION: Cardiomegaly.  Lungs remain grossly clear. Electronically Signed   By: Garald Balding M.D.   On: 08/02/2015 22:44   Dg Chest 2 View  07/18/2015  CLINICAL DATA:  Short of breath today. History of sickle cell disease. EXAM: CHEST  2 VIEW COMPARISON:  07/17/2015 FINDINGS: There is moderate enlargement of the cardiac silhouette, unchanged. No mediastinal or hilar masses or evidence of adenopathy. Mild right lung base scarring. There are prominent bronchovascular markings, but no evidence of pneumonia or pulmonary edema. No pleural effusion or pneumothorax. Left anterior chest wall Port-A-Cath is stable. Bony thorax is intact. No change from the prior study. IMPRESSION: No acute cardiopulmonary disease. Electronically Signed   By: Lajean Manes M.D.   On: 07/18/2015 16:08   Dg Chest 2 View  07/17/2015  CLINICAL DATA:  Sickle cell crisis with pain and shortness of Breath EXAM: CHEST  2 VIEW COMPARISON:  07/09/2015 FINDINGS: Cardiac shadow is again enlarged. Left chest wall port is again seen and stable. No focal infiltrate or sizable effusion is noted. No acute bony abnormality is seen. IMPRESSION: No acute abnormality noted. Electronically Signed   By: Inez Catalina M.D.   On: 07/17/2015 14:50   Dg Chest 2 View  07/08/2015  CLINICAL DATA:  Shortness of breath,, hypoxia, history of sickle cell EXAM: CHEST  2 VIEW COMPARISON:  07/07/2015 FINDINGS: Cardiomegaly again noted. Left  subclavian Port-A-Cath is unchanged in position. No acute infiltrate or pleural effusion. No pulmonary edema. IMPRESSION: No active cardiopulmonary disease. Electronically Signed   By: Lahoma Crocker M.D.   On: 07/08/2015 11:01   X-ray Chest Pa And Lateral  07/07/2015  CLINICAL DATA:  Shortness of breath, history of sickle cell EXAM: CHEST  2 VIEW COMPARISON:  07/06/2015 FINDINGS: Moderate cardiac silhouette enlargement stable. Minimal vascular congestion. No pleural effusion. No consolidation. IMPRESSION: Cardiac enlargement stable with development of very mild vascular congestion. Electronically Signed   By: Skipper Cliche M.D.   On: 07/07/2015 11:27   Dg Chest 2 View  07/06/2015  CLINICAL DATA:  Sickle cell crisis with chest pain and shortness of breath today. EXAM: CHEST  2 VIEW COMPARISON:  Chest x-ray dated 06/19/2015. FINDINGS: Cardiomegaly is stable. Left chest wall Port-A-Cath is stable in position with tip overlying the expected location of the cavoatrial junction. Pulmonary vasculature remains mildly prominent, without frank pulmonary edema. Suspect some degree of chronic pulmonary artery hypertension. No evidence of pneumonia. No pleural effusion or pneumothorax seen. Osseous structures about the chest are unremarkable. IMPRESSION: Stable cardiomegaly.  No acute findings. Electronically Signed   By: Franki Cabot M.D.   On: 07/06/2015 12:50   Dg Chest Port 1 View  07/09/2015  CLINICAL DATA:  Shortness of breath.  Sickle cell disease EXAM: PORTABLE CHEST 1 VIEW COMPARISON:  July 08, 2015 FINDINGS: Port-A-Cath tip is in the superior cava. No pneumothorax. There is no edema or consolidation. There is generalized cardiac enlargement. There is pulmonary venous hypertension. No adenopathy evident. No bone lesions. IMPRESSION: Cardiomegaly with pulmonary vascular congestion.  No consolidation. Electronically Signed   By: Lowella Grip III M.D.   On: 07/09/2015 08:04      Recent Results (from the past  240 hour(s))  MRSA PCR Screening     Status: None   Collection Time: 08/03/15  3:15 AM  Result Value Ref Range Status   MRSA by PCR NEGATIVE NEGATIVE Final    Comment:        The GeneXpert MRSA Assay (FDA approved for NASAL specimens only), is one component of a comprehensive MRSA colonization surveillance program. It is not intended to diagnose MRSA infection nor to guide or monitor treatment for MRSA infections.   Culture, blood (routine x 2)     Status: None (Preliminary result)   Collection Time: 08/03/15  3:25 AM  Result Value Ref Range Status   Specimen Description BLOOD RIGHT WRIST  Final   Special Requests NONE  Final   Culture   Final    NO GROWTH 1 DAY Performed at [facility] [place]    Report Status PENDING  Incomplete  Culture, blood (routine x 2)     Status: None (Preliminary result)   Collection Time: 08/03/15  3:25 AM  Result Value Ref Range Status   Specimen Description BLOOD RIGHT HAND  Final   Special Requests IN PEDIATRIC BOTTLE 0.5CC  Final   Culture   Final    NO GROWTH 1 DAY Performed at [facility]    Report Status PENDING  Incomplete    BRIEF ADMITTING H & P: [name] is a 35 y.o. male with medical history significant of history of sickle cell anemia, pulmonary hypertension, chronic respiratory failure, history of PE and hypertension presented to the ER because of increasing pain in his mid back with left upper quadrant pain and rib pain over the last 2 days which has been gradually worsening. Denies any nausea vomiting currently, fever chills any headaches focal deficits. The patient is found to be mildly febrile and has hemoglobin is at the baseline. White blood cell count is more than usual. Patient has been admitted for sickle cell pain crisis. Chest x-ray does not show any acute infiltrates.   ED Course: Patient has missed today's on pain medication and empiric antibiotics since patient had mild fever with leukocytosis and  hypoxia. Patient is usually chronically hypoxic and uses home oxygen at bedtime.  Hospital Course:  Present on Admission:  . Sickle cell pain crisis [facility]): Patient reported pain to be minimal. He was placed  on a PCA and within 24 hours was transitioned to oral analgesics. He is discharged home on his pre-hospital analgesic regimen. . Anemia of chronic disease: Has a baseline hemoglobin of 6.5-7. During his hospitalization his Hb remained stable without any symptoms. He is on chronic red cell pheresis at [facility] and is scheduled for next pheresis on 08/11/2015.  Marland Kitchen Chronic respiratory failure with hypoxia: Patient maintained saturations on 3 l/min of Oxygen and saturations were 94% on 3 l/min during ambulation. Marland Kitchen Hx of recurrent pulmonary emboli: Patient was continued on Xarelto 20 mg.   . Pulmonary HTN (HCC)/Cor Pulmonale: Patient was started on Lentairis during his last hospitalization at [facility].   . Essential hypertension: Well controlled on current medications  . Chronic pain syndrome: Continue MS Contin  . Leukocytosis: Patient had an elevated white blood cell count without any evidence of infection. It is felt that this is secondary to high bone marrow turnover associated with his anemia in addition to chronic hypoxemia.  . H/O Proteinuria: Pt has a h/o proteinuria and was on lisinopril. This was discontinued at [facility] in the context of HTN. Currently his BP is well controlled but will resume Lisinopril 5 mg for renal Protection.    Disposition and Follow-up: Patient is discharged in good condition. At the time of discharge his oxygen requirement is 3 L/m of oxygen with ambulation. He's to follow-up with his primary care physician within one week. Pt also has an appointment with Hematologist Dr. Manuella Ghazi for 08/10/2015 and he is also to call and follow up with Dr. Christy Sartorius Test on new medication for pulmonary hypertension.   DISCHARGE EXAM:  General: Alert, awake, oriented x3, in mild distress.  Vital  signs: BP121/86, HR 72, T 98.5 F (36.9 C), temperature source Oral, RR 18, height 6\' 1"  (1.854 m), weight 174 lb 12.8 oz (79.289 kg), SpO2 94 % on 3 L/m of oxygen with ambulation. HEENT: [place]/AT PEERL, EOMI, mild icterus Neck: Trachea midline, no masses, no thyromegal,y no JVD, no carotid bruit OROPHARYNX: Moist, No exudate/ erythema/lesions.  Heart: Regular rate and rhythm, without murmurs, rubs, gallops or S3. PMI non-displaced. Exam reveals no decreased pulses. Pulmonary/Chest: Normal effort. Breath sounds normal. No. Apnea. Clear to auscultation,no stridor,  no wheezing and no rhonchi noted. No respiratory distress and no tenderness noted. Abdomen: Soft, nontender, nondistended, normal bowel sounds, no masses no hepatosplenomegaly noted. No fluid wave and no ascites. There is no guarding or rebound. Neuro: Alert and oriented to person, place and time. Normal motor skills, Displays no atrophy or tremors and exhibits normal muscle tone.  No focal neurological deficits noted cranial nerves II through XII grossly intact. No sensory deficit noted. Strength at baseline in bilateral upper and lower extremities. Gait normal. Musculoskeletal: No warm swelling or erythema around joints, no spinal tenderness noted. Psychiatric: Patient alert and oriented x3, good insight and cognition, good recent to remote recall. Mood, memory, affect and judgement normal Skin: Skin is warm and dry. No bruising, no ecchymosis and no rash noted. Pt is not diaphoretic. No erythema. No pallor     Recent Labs  08/02/15 2307 08/04/15 1020  NA 140 138  K 4.3 4.5  CL 109 108  CO2 22 24  GLUCOSE 121* 108*  BUN 15 18  CREATININE 1.22 0.85  CALCIUM 9.2 8.5*    Recent Labs  08/02/15 2307  AST 35  ALT 39  ALKPHOS 86  BILITOT 2.2*  PROT 7.7  ALBUMIN 4.3   No results for input(s): LIPASE,  AMYLASE in the last 72 hours.    Recent Labs  08/02/15 2307 08/04/15 1020  WBC 17.0* 11.0*  NEUTROABS 12.3* 8.2*   HGB 6.4* 6.4*  HCT 19.6* 19.2*  MCV 98.0 97.0  PLT 693* 649*     Total time spent including face to face and decision making was greater than 30 minutes  Signed: [name],[name] 08/04/2015, 12:17 PM

## 2015-08-04 NOTE — Progress Notes (Signed)
SATURATION QUALIFICATIONS: (This note is used to comply with regulatory documentation for home oxygen)  Patient Saturations on Room Air at Rest = %  Patient Saturations on Room Air while Ambulating = 83%  Patient Saturations on 3 Liters of oxygen while Ambulating = 94%  Please briefly explain why patient needs home oxygen:continues to need o2 due to desating. Pt already wears o2 at home , sat resting on 3l 100%

## 2015-08-04 NOTE — Progress Notes (Signed)
Patient left with out discharge instructions from nurse.

## 2015-08-05 ENCOUNTER — Telehealth: Payer: Self-pay

## 2015-08-05 ENCOUNTER — Ambulatory Visit: Payer: Self-pay | Admitting: Family Medicine

## 2015-08-05 LAB — TYPE AND SCREEN
ABO/RH(D): O POS
Antibody Screen: NEGATIVE
Unit division: 0

## 2015-08-05 NOTE — Telephone Encounter (Signed)
Refill request for dilaudid, ambien, and ms contin. LOV 06/09/2015. Please advise.

## 2015-08-07 ENCOUNTER — Encounter (HOSPITAL_COMMUNITY): Payer: Self-pay | Admitting: *Deleted

## 2015-08-07 ENCOUNTER — Non-Acute Institutional Stay (HOSPITAL_COMMUNITY): Payer: Medicare Other

## 2015-08-07 ENCOUNTER — Telehealth (HOSPITAL_COMMUNITY): Payer: Self-pay | Admitting: Hematology

## 2015-08-07 ENCOUNTER — Other Ambulatory Visit: Payer: Self-pay | Admitting: Internal Medicine

## 2015-08-07 ENCOUNTER — Non-Acute Institutional Stay (HOSPITAL_COMMUNITY)
Admission: AD | Admit: 2015-08-07 | Discharge: 2015-08-07 | Disposition: A | Discharge status: Home / Self Care | Payer: Medicare Other | Source: Ambulatory Visit | Attending: Internal Medicine | Admitting: Internal Medicine

## 2015-08-07 DIAGNOSIS — Z9114 Patient's other noncompliance with medication regimen: Secondary | ICD-10-CM | POA: Insufficient documentation

## 2015-08-07 DIAGNOSIS — Z87891 Personal history of nicotine dependence: Secondary | ICD-10-CM | POA: Insufficient documentation

## 2015-08-07 DIAGNOSIS — Z86718 Personal history of other venous thrombosis and embolism: Secondary | ICD-10-CM | POA: Insufficient documentation

## 2015-08-07 DIAGNOSIS — Z7901 Long term (current) use of anticoagulants: Secondary | ICD-10-CM | POA: Insufficient documentation

## 2015-08-07 DIAGNOSIS — R0602 Shortness of breath: Secondary | ICD-10-CM | POA: Insufficient documentation

## 2015-08-07 DIAGNOSIS — D5700 Hb-SS disease with crisis, unspecified: Secondary | ICD-10-CM

## 2015-08-07 DIAGNOSIS — R05 Cough: Secondary | ICD-10-CM | POA: Diagnosis not present

## 2015-08-07 DIAGNOSIS — Z96641 Presence of right artificial hip joint: Secondary | ICD-10-CM | POA: Insufficient documentation

## 2015-08-07 DIAGNOSIS — I10 Essential (primary) hypertension: Secondary | ICD-10-CM | POA: Insufficient documentation

## 2015-08-07 DIAGNOSIS — Z79899 Other long term (current) drug therapy: Secondary | ICD-10-CM | POA: Diagnosis not present

## 2015-08-07 DIAGNOSIS — J9610 Chronic respiratory failure, unspecified whether with hypoxia or hypercapnia: Secondary | ICD-10-CM | POA: Insufficient documentation

## 2015-08-07 DIAGNOSIS — Z9981 Dependence on supplemental oxygen: Secondary | ICD-10-CM | POA: Diagnosis not present

## 2015-08-07 DIAGNOSIS — Z7982 Long term (current) use of aspirin: Secondary | ICD-10-CM | POA: Diagnosis not present

## 2015-08-07 DIAGNOSIS — Z86711 Personal history of pulmonary embolism: Secondary | ICD-10-CM | POA: Insufficient documentation

## 2015-08-07 DIAGNOSIS — R52 Pain, unspecified: Secondary | ICD-10-CM | POA: Diagnosis present

## 2015-08-07 DIAGNOSIS — G4700 Insomnia, unspecified: Secondary | ICD-10-CM

## 2015-08-07 DIAGNOSIS — G894 Chronic pain syndrome: Secondary | ICD-10-CM

## 2015-08-07 LAB — COMPREHENSIVE METABOLIC PANEL
ALBUMIN: 4.4 g/dL (ref 3.5–5.0)
ALK PHOS: 100 U/L (ref 38–126)
ALT: 26 U/L (ref 17–63)
AST: 29 U/L (ref 15–41)
Anion gap: 5 (ref 5–15)
BILIRUBIN TOTAL: 3 mg/dL — AB (ref 0.3–1.2)
BUN: 10 mg/dL (ref 6–20)
CALCIUM: 8.9 mg/dL (ref 8.9–10.3)
CO2: 24 mmol/L (ref 22–32)
Chloride: 109 mmol/L (ref 101–111)
Creatinine, Ser: 0.78 mg/dL (ref 0.61–1.24)
GFR calc Af Amer: >60 mL/min (ref >60)
GLUCOSE: 127 mg/dL — AB (ref 65–99)
Potassium: 4.1 mmol/L (ref 3.5–5.1)
Sodium: 138 mmol/L (ref 135–145)
TOTAL PROTEIN: 7.8 g/dL (ref 6.5–8.1)

## 2015-08-07 LAB — CBC WITH DIFFERENTIAL/PLATELET
BASOS ABS: 0.1 10*3/uL (ref 0.0–0.1)
Basophils Relative: 1 %
EOS ABS: 0.3 10*3/uL (ref 0.0–0.7)
Eosinophils Relative: 2 %
HCT: 20.5 % — ABNORMAL LOW (ref 39.0–52.0)
Hemoglobin: 6.9 g/dL — CL (ref 13.0–17.0)
LYMPHS ABS: 2.3 10*3/uL (ref 0.7–4.0)
Lymphocytes Relative: 17 %
MCH: 31.9 pg (ref 26.0–34.0)
MCHC: 33.7 g/dL (ref 30.0–36.0)
MCV: 94.9 fL (ref 78.0–100.0)
MONO ABS: 1.7 10*3/uL — AB (ref 0.1–1.0)
Monocytes Relative: 13 %
NEUTROS ABS: 9 10*3/uL — AB (ref 1.7–7.7)
Neutrophils Relative %: 67 %
Platelets: 580 10*3/uL — ABNORMAL HIGH (ref 150–400)
RBC: 2.16 MIL/uL — AB (ref 4.22–5.81)
RDW: 23.8 % — ABNORMAL HIGH (ref 11.5–15.5)
WBC: 13.4 10*3/uL — AB (ref 4.0–10.5)

## 2015-08-07 MED ORDER — SODIUM CHLORIDE 0.9% FLUSH: 9.0000 mL | INTRAVENOUS | Status: DC | PRN

## 2015-08-07 MED ORDER — ZOLPIDEM TARTRATE 10 MG PO TABS: 10.0000 mg | ORAL_TABLET | Freq: Every evening | ORAL | Status: DC | PRN

## 2015-08-07 MED ORDER — KETOROLAC TROMETHAMINE 30 MG/ML IJ SOLN
30.0000 mg | Freq: Four times a day (QID) | INTRAMUSCULAR | Status: DC
  Administered 2015-08-07: 30 mg via INTRAVENOUS
  Filled 2015-08-07: qty 1

## 2015-08-07 MED ORDER — TADALAFIL 10 MG PO TABS: 10.0000 mg | ORAL_TABLET | Freq: Every day | ORAL | Status: DC | PRN

## 2015-08-07 MED ORDER — DEXTROSE-NACL 5-0.45 % IV SOLN
INTRAVENOUS | Status: DC
  Administered 2015-08-07: 11:00:00 via INTRAVENOUS

## 2015-08-07 MED ORDER — DIPHENHYDRAMINE HCL 25 MG PO CAPS: 25.0000 mg | ORAL_CAPSULE | ORAL | Status: DC | PRN

## 2015-08-07 MED ORDER — NALOXONE HCL 0.4 MG/ML IJ SOLN: 0.4000 mg | INTRAMUSCULAR | Status: DC | PRN

## 2015-08-07 MED ORDER — HEPARIN SOD (PORK) LOCK FLUSH 100 UNIT/ML IV SOLN
500.0000 [IU] | INTRAVENOUS | Status: AC | PRN
  Administered 2015-08-07: 500 [IU]
  Filled 2015-08-07: qty 5

## 2015-08-07 MED ORDER — ONDANSETRON HCL 4 MG/2ML IJ SOLN: 4.0000 mg | Freq: Four times a day (QID) | INTRAMUSCULAR | Status: DC | PRN

## 2015-08-07 MED ORDER — POLYETHYLENE GLYCOL 3350 17 G PO PACK
17.0000 g | PACK | Freq: Every day | ORAL | Status: DC | PRN
  Filled 2015-08-07: qty 1

## 2015-08-07 MED ORDER — SODIUM CHLORIDE 0.9% FLUSH
10.0000 mL | INTRAVENOUS | Status: AC | PRN
  Administered 2015-08-07: 10 mL

## 2015-08-07 MED ORDER — HYDROMORPHONE HCL 4 MG PO TABS: 4.0000 mg | ORAL_TABLET | ORAL | Status: DC | PRN

## 2015-08-07 MED ORDER — SODIUM CHLORIDE 0.9 % IV SOLN
25.0000 mg | INTRAVENOUS | Status: DC | PRN
  Filled 2015-08-07: qty 0.5

## 2015-08-07 MED ORDER — MORPHINE SULFATE ER 30 MG PO TBCR: 30.0000 mg | EXTENDED_RELEASE_TABLET | Freq: Two times a day (BID) | ORAL | Status: DC

## 2015-08-07 MED ORDER — SENNOSIDES-DOCUSATE SODIUM 8.6-50 MG PO TABS: 1.0000 | ORAL_TABLET | Freq: Two times a day (BID) | ORAL | Status: DC

## 2015-08-07 MED ORDER — HYDROMORPHONE 1 MG/ML IV SOLN
INTRAVENOUS | Status: DC
  Administered 2015-08-07: 6.3 mg via INTRAVENOUS
  Administered 2015-08-07: 7.7 mg via INTRAVENOUS
  Administered 2015-08-07: 11:00:00 via INTRAVENOUS
  Filled 2015-08-07: qty 25

## 2015-08-07 NOTE — H&P (Signed)
[facility] History and Physical  [name] [id number] DOB: 01/20/1981 DOA: 08/07/2015  PCP: [name], MD   Chief Complaint: General body pain  HPI: [name] is a 35 y.o. male with history of sickle cell disease, acute chest syndrome, DVT, PE on Xarelto, pulmonary hypertension, cor pulmonale, chronic respiratory failure on home oxygen, nonadherent with oxygen use and medications for SCD except the narcotics. He presented to the day hospital today with C/O pain to shoulders, sides and arms. Patient rates pain at 8-9/10 on pain scale. Patient denies chest pain, shortness of breath, difficulty breathing. Patient denies N/V/D and abdominal pain. Patient states he has taken his home pain medication without improvement for several days.  Systemic Review: General: The patient denies anorexia, fever, weight loss Cardiac: Denies chest pain, syncope, palpitations, pedal edema  Respiratory: Denies cough, shortness of breath, wheezing GI: Denies severe indigestion/heartburn, abdominal pain, nausea, vomiting, diarrhea and constipation GU: Denies hematuria, incontinence, dysuria  Musculoskeletal: Denies arthritis  Skin: Denies suspicious skin lesions Neurologic: Denies focal weakness or numbness, change in vision  Past Medical History  Diagnosis Date  . Sickle cell anemia (HCC)   . Blood transfusion   . Acute embolism and thrombosis of right internal jugular vein ([place])   . Hypokalemia   . Mood disorder ([place])   . History of pulmonary embolus (PE)   . Avascular necrosis (HCC)     Right Hip  . Leukocytosis     Chronic  . Thrombocytosis (HCC)     Chronic  . Hypertension   . History of Clostridium difficile infection   . Uses marijuana   . Chronic anticoagulation   . Functional asplenia   . Former smoker   . Second hand tobacco smoke exposure   . Alcohol consumption of one to four drinks per day   . Noncompliance with medication regimen   .  Sickle-cell crisis with associated acute chest syndrome ([place]) 05/13/2013  . Acute chest syndrome ([place]) 06/18/2013  . Demand ischemia ([place]) 01/02/2014  . Pulmonary hypertension ([place])   . Hb-SS disease with crisis ([place])   . Oxygen deficiency     Past Surgical History  Procedure Laterality Date  . Right hip replacement      08/2006  . Cholecystectomy      01/2008  . Porta cath placement    . Porta cath removal    . Umbilical hernia repair      01/2008  . Excision of left periauricular cyst      10/2009  . Excision of right ear lobe cyst with primary closur      11/2007  . Portacath placement  01/05/2012    Procedure: INSERTION PORT-A-CATH;  Surgeon: Odis Hollingshead, MD;  Location: [place];  Service: General;  Laterality: N/A;  ultrasound guiced port a cath insertion with fluoroscopy    No Known Allergies  Family History  Problem Relation Age of Onset  . Sickle cell trait Mother   . Depression Mother   . Diabetes Mother   . Sickle cell trait Father   . Sickle cell trait Brother       Prior to Admission medications   Medication Sig Start Date End Date Taking? Authorizing Provider  acetaminophen (TYLENOL) 325 MG tablet Take 650 mg by mouth every 6 (six) hours as needed for moderate pain.   Yes Historical Provider, MD  aspirin 81 MG chewable tablet Chew 1 tablet (81 mg total) by mouth daily. 07/23/14  Yes Sharyn Lull  Linus Galas, MD  Cholecalciferol (VITAMIN D) 2000 units tablet Take 1 tablet (2,000 Units total) by mouth daily. 02/23/15  Yes Dorena Dew, FNP  Deferasirox (JADENU) 360 MG TABS Take 1,080 mg by mouth 2 (two) times daily.    Yes Historical Provider, MD  folic acid (FOLVITE) 1 MG tablet Take 1 tablet (1 mg total) by mouth every morning. 02/23/15  Yes Dorena Dew, FNP  gabapentin (NEURONTIN) 300 MG capsule Take 1 capsule (300 mg total) by mouth 3 (three) times daily. 06/01/15  Yes Tresa Garter, MD  hydroxyurea (HYDREA) 500 MG capsule Take 2 capsules (1,000 mg  total) by mouth daily. May take with food to minimize GI side effects. Patient taking differently: Take 1,500 mg by mouth daily. May take with food to minimize GI side effects. 06/01/15  Yes Tresa Garter, MD  lisinopril (PRINIVIL,ZESTRIL) 5 MG tablet Take 1 tablet (5 mg total) by mouth daily. 08/04/15  Yes [name], MD  potassium chloride SA (K-DUR,KLOR-CON) 20 MEQ tablet Take 1 tablet (20 mEq total) by mouth every morning. Patient taking differently: Take 20 mEq by mouth daily.  04/13/15  Yes Dorena Dew, FNP  rivaroxaban (XARELTO) 20 MG TABS tablet Take 20 mg by mouth daily with supper.   Yes Historical Provider, MD  torsemide (DEMADEX) 20 MG tablet Take 20 mg by mouth daily as needed. For increase in patient weight greater than 3 lbs. 07/27/15 07/26/16 Yes Historical Provider, MD  HYDROmorphone (DILAUDID) 4 MG tablet Take 1 tablet (4 mg total) by mouth every 4 (four) hours as needed for severe pain. 08/07/15   Tresa Garter, MD  morphine (MS CONTIN) 30 MG 12 hr tablet Take 1 tablet (30 mg total) by mouth every 12 (twelve) hours. 08/07/15   Tresa Garter, MD  tadalafil (CIALIS) 10 MG tablet Take 1 tablet (10 mg total) by mouth daily as needed for erectile dysfunction. 08/07/15   Tresa Garter, MD  zolpidem (AMBIEN) 10 MG tablet Take 1 tablet (10 mg total) by mouth at bedtime as needed for sleep. 08/07/15   Tresa Garter, MD     Physical Exam: Filed Vitals:   08/07/15 1149 08/07/15 1257 08/07/15 1432 08/07/15 1436  BP: 127/83 116/74 127/68   Pulse: 87 81 83   Temp: 98.3 F (36.8 C)     TempSrc: Oral     Resp: 19 20 18    Height:      Weight:      SpO2: 94% 91% 92% 95%    General: Alert, awake, afebrile, anicteric, not in obvious distress HEENT: Normocephalic and Atraumatic, Mucous membranes pink                PERRLA; EOM intact; No scleral icterus,                 Nares: Patent, Oropharynx: Clear, Fair Dentition                 Neck: FROM, no cervical  lymphadenopathy, thyromegaly, carotid bruit or JVD;  CHEST WALL: No tenderness  CHEST: Normal respiration, clear to auscultation bilaterally  HEART: Regular rate and rhythm; no murmurs rubs or gallops  BACK: No kyphosis or scoliosis; no CVA tenderness  ABDOMEN: Positive Bowel Sounds, soft, non-tender; no masses, no organomegaly EXTREMITIES: No cyanosis, clubbing, or edema SKIN:  no rash or ulceration  CNS: Alert and Oriented x 4, Nonfocal exam, CN 2-12 intact  Labs on Admission:  Basic Metabolic Panel:  Recent Labs Lab 08/02/15 2307 08/04/15 1020 08/07/15 0950  NA 140 138 138  K 4.3 4.5 4.1  CL 109 108 109  CO2 22 24 24   GLUCOSE 121* 108* 127*  BUN 15 18 10   CREATININE 1.22 0.85 0.78  CALCIUM 9.2 8.5* 8.9   Liver Function Tests:  Recent Labs Lab 08/02/15 2307 08/07/15 0950  AST 35 29  ALT 39 26  ALKPHOS 86 100  BILITOT 2.2* 3.0*  PROT 7.7 7.8  ALBUMIN 4.3 4.4   No results for input(s): LIPASE, AMYLASE in the last 168 hours. No results for input(s): AMMONIA in the last 168 hours. CBC:  Recent Labs Lab 08/02/15 2307 08/04/15 1020 08/07/15 0950  WBC 17.0* 11.0* 13.4*  NEUTROABS 12.3* 8.2* 9.0*  HGB 6.4* 6.4* 6.9*  HCT 19.6* 19.2* 20.5*  MCV 98.0 97.0 94.9  PLT 693* 649* 580*   Cardiac Enzymes: No results for input(s): CKTOTAL, CKMB, CKMBINDEX, TROPONINI in the last 168 hours.  BNP (last 3 results)  Recent Labs  09/10/14 0612 11/30/14 0910 08/02/15 2307  BNP 549.5* 639.7* 503.2*    ProBNP (last 3 results) No results for input(s): PROBNP in the last 8760 hours.  CBG: No results for input(s): GLUCAP in the last 168 hours.   Assessment/Plan Active Problems:   Sickle cell anemia with pain ([place])   Admits to the Day Hospital  IVF D5 .45% Saline @ 125 mls/hour  Weight based Dilaudid PCA started within 30 minutes of admission  IV Toradol 30 mg Q 6 H  Monitor vitals very closely, Re-evaluate pain scale every hour  2 L of Oxygen by  [place]  Patient will be re-evaluated for pain in the context of function and relationship to baseline as care progresses.  If no significant relieve from pain (remains above 5/10) will transfer patient to inpatient services for further evaluation and management  Code Status: Full  Family Communication: None  DVT Prophylaxis: Ambulate as tolerated   Time spent: 25 Minutes  Ezechiel Stooksbury, MD, MHA, FACP, FAAP, CPE  If 7PM-7AM, please contact night-coverage [email or web address] 08/07/2015, 3:09 PM

## 2015-08-07 NOTE — Progress Notes (Signed)
CRITICAL VALUE ALERT  Critical value received: Hgb  Date of notification:  08/07/15  Time of notification:  1120 Critical value read back:yes  Nurse who received alert:  Chapman Moss RN  MD notified (1st page):  Dr. Doreene Burke  Time of first page:  1120  MD notified (2nd page):  Time of second page:  Responding MD:  Doreene Burke  Time MD responded:  1122

## 2015-08-07 NOTE — Progress Notes (Signed)
Pt received to the [facility] for treatment. Pt's O2 sat was 88, placed on 3 liters of oxygen, now up to 92. Pt is drowsy, stated that he got up at 5am this morning. Dr. Doreene Burke notifed and will order chest xray.

## 2015-08-07 NOTE — Telephone Encounter (Signed)
Patient requesting to come to sickle cell day hospital.  Patient C/O pain to shoulders, sides and arms.  Patient rates pain 8-9/10 on pain scale.  Patient denies chest pain, shortness of breath, difficulty breathing.  Patient denies N/V/D and abdominal pain.  Patient states he has taken his home pain medication without improvement for several days.  I advised I would talk with provider and give him a call back.  Patient verbalizes understanding.

## 2015-08-07 NOTE — Telephone Encounter (Signed)
Discussed with Dr. Doreene Burke.  Called patient back and advised per Dr. Doreene Burke, it is ok for him to come to [facility].  Patient verbalizes understanding.

## 2015-08-07 NOTE — Progress Notes (Signed)
Patient treated in [facility] for acute pain in shoulders. 8/10 intensity. Treated with IV fluids, IV Toradol and Dilaudid PCA. Pain level down to 3/10 at time of discharge. Went over discharge instructions and copy given to patient. Pt. Signed for prescriptions for MS Contin and Cialis. Alert, oriented and ambulatory at time of discharge. Patient did not bring oxygen with him but he states he uses the oxygen as needed at home.

## 2015-08-07 NOTE — Discharge Instructions (Signed)
Sickle Cell Anemia, Adult Sickle cell anemia is a condition in which red blood cells have an abnormal "sickle" shape. This abnormal shape shortens the cells' life span, which results in a lower than normal concentration of red blood cells in the blood. The sickle shape also causes the cells to clump together and block free blood flow through the blood vessels. As a result, the tissues and organs of the body do not receive enough oxygen. Sickle cell anemia causes organ damage and pain and increases the risk of infection. CAUSES  Sickle cell anemia is a genetic disorder. Those who receive two copies of the gene have the condition, and those who receive one copy have the trait. RISK FACTORS The sickle cell gene is most common in people whose families originated in [place]. Other areas of the globe where sickle cell trait occurs include the [place], [place] and [place], the [place], and the [place].  SIGNS AND SYMPTOMS  Pain, especially in the extremities, back, chest, or abdomen (common). The pain may start suddenly or may develop following an illness, especially if there is dehydration. Pain can also occur due to overexertion or exposure to extreme temperature changes.  Frequent severe bacterial infections, especially certain types of pneumonia and meningitis.  Pain and swelling in the hands and feet.  Decreased activity.   Loss of appetite.   Change in behavior.  Headaches.  Seizures.  Shortness of breath or difficulty breathing.  Vision changes.  Skin ulcers. Those with the trait may not have symptoms or they may have mild symptoms.  DIAGNOSIS  Sickle cell anemia is diagnosed with blood tests that demonstrate the genetic trait. It is often diagnosed during the newborn period, due to mandatory testing nationwide. A variety of blood tests, X-rays, CT scans, MRI scans, ultrasounds, and lung function tests may also be done to monitor the condition. TREATMENT  Sickle  cell anemia may be treated with:  Medicines. You may be given pain medicines, antibiotic medicines (to treat and prevent infections) or medicines to increase the production of certain types of hemoglobin.  Fluids.  Oxygen.  Blood transfusions. HOME CARE INSTRUCTIONS   Drink enough fluid to keep your urine clear or pale yellow. Increase your fluid intake in hot weather and during exercise.  Do not smoke. Smoking lowers oxygen levels in the blood.   Only take over-the-counter or prescription medicines for pain, fever, or discomfort as directed by your health care provider.  Take antibiotics as directed by your health care provider. Make sure you finish them it even if you start to feel better.   Take supplements as directed by your health care provider.   Consider wearing a medical alert bracelet. This tells anyone caring for you in an emergency of your condition.   When traveling, keep your medical information, health care provider's names, and the medicines you take with you at all times.   If you develop a fever, do not take medicines to reduce the fever right away. This could cover up a problem that is developing. Notify your health care provider.  Keep all follow-up appointments with your health care provider. Sickle cell anemia requires regular medical care. SEEK MEDICAL CARE IF: You have a fever. SEEK IMMEDIATE MEDICAL CARE IF:   You feel dizzy or faint.   You have new abdominal pain, especially on the left side near the stomach area.   You develop a persistent, often uncomfortable and painful penile erection (priapism). If this is not treated immediately it   will lead to impotence.   You have numbness your arms or legs or you have a hard time moving them.   You have a hard time with speech.   You have a fever or persistent symptoms for more than 2-3 days.   You have a fever and your symptoms suddenly get worse.   You have signs or symptoms of infection.  These include:   Chills.   Abnormal tiredness (lethargy).   Irritability.   Poor eating.   Vomiting.   You develop pain that is not helped with medicine.   You develop shortness of breath.  You have pain in your chest.   You are coughing up pus-like or bloody sputum.   You develop a stiff neck.  Your feet or hands swell or have pain.  Your abdomen appears bloated.  You develop joint pain. MAKE SURE YOU:  Understand these instructions.   This information is not intended to replace advice given to you by your health care provider. Make sure you discuss any questions you have with your health care provider.   Document Released: 04/27/2005 Document Revised: 02/07/2014 Document Reviewed: 08/29/2012 Elsevier Interactive Patient Education 2016 [employer]  

## 2015-08-07 NOTE — Discharge Summary (Signed)
Physician Discharge Summary  Gerald KIBBEY [id number] DOB: March 02, 1980 DOA: 08/07/2015  PCP: [name], MD  Admit date: 08/07/2015  Discharge date: 08/07/2015  Time spent: 30 minutes  Discharge Diagnoses:  Active Problems:   Sickle cell anemia with pain (HCC)   Shortness of breath  Discharge Condition: Stable  Diet recommendation: Regular  Filed Weights   08/07/15 1005  Weight: 164 lb (74.39 kg)   History of present illness:  [name] is a 35 y.o. male with history of sickle cell disease, acute chest syndrome, DVT, PE on Xarelto, pulmonary hypertension, cor pulmonale, chronic respiratory failure on home oxygen, nonadherent with oxygen use and medications for SCD except the narcotics. He presented to the day hospital today with C/O pain to shoulders, sides and arms. Patient rates pain at 8-9/10 on pain scale. Patient denies chest pain, shortness of breath, difficulty breathing. Patient denies N/V/D and abdominal pain. Patient states he has taken his home pain medication without improvement for several days.  Hospital Course:  EPHRAM MERENDA was admitted to the day hospital with sickle cell painful crisis. Patient was treated with weight based IV Dilaudid PCA, IV Toradol as well as IV fluids. Patient's oxygenation dropped to 88% while on PCA Dilaudid, then improved to 92% on 2 L of oxygen. CXR showed Mild increased interstitial changes without focal infiltrate.Hb is stable at 6.9 g/dl.  Ladaniel showed significant improvement symptomatically, pain improved from 9 to 3 out of 10 at the time of discharge. Yurem will follow-up at the clinic as previously scheduled, continue with home medications as per prior to admission. He requested prescription for Cialis for his ED. Prescription for 10 tabs 10 mg Cialis given and specific instructions on likely side effects discussed with patient. He verbalized understanding.   Discharge Instructions We discussed the need for  good hydration, monitoring of hydration status, avoidance of heat, cold, stress, and infection triggers. We discussed the need to be compliant with taking Hydrea. Wofford was reminded of the need to seek medical attention of any symptoms of bleeding, anemia, or infection occurs.  Discharge Exam: Filed Vitals:   08/07/15 1257 08/07/15 1432  BP: 116/74 127/68  Pulse: 81 83  Temp:    Resp: 20 18   General appearance: alert, cooperative and no distress Eyes: conjunctivae/corneas clear. PERRL, EOM's intact. Fundi benign. Neck: no adenopathy, no carotid bruit, no JVD, supple, symmetrical, trachea midline and thyroid not enlarged, symmetric, no tenderness/mass/nodules Back: symmetric, no curvature. ROM normal. No CVA tenderness. Resp: clear to auscultation bilaterally Chest wall: no tenderness Cardio: regular rate and rhythm, S1, S2 normal, no murmur, click, rub or gallop GI: soft, non-tender; bowel sounds normal; no masses, no organomegaly Extremities: extremities normal, atraumatic, no cyanosis or edema Pulses: 2+ and symmetric Skin: Skin color, texture, turgor normal. No rashes or lesions Neurologic: Grossly normal  Current Discharge Medication List    CONTINUE these medications which have NOT CHANGED   Details  acetaminophen (TYLENOL) 325 MG tablet Take 650 mg by mouth every 6 (six) hours as needed for moderate pain.    aspirin 81 MG chewable tablet Chew 1 tablet (81 mg total) by mouth daily. Qty: 30 tablet, Refills: 11    Cholecalciferol (VITAMIN D) 2000 units tablet Take 1 tablet (2,000 Units total) by mouth daily. Qty: 90 tablet, Refills: 3   Associated Diagnoses: Vitamin D deficiency    Deferasirox (JADENU) 360 MG TABS Take 1,080 mg by mouth 2 (two) times daily.     folic acid (  FOLVITE) 1 MG tablet Take 1 tablet (1 mg total) by mouth every morning. Qty: 30 tablet, Refills: 11   Associated Diagnoses: Hb-SS disease without crisis ([place])    gabapentin (NEURONTIN) 300 MG  capsule Take 1 capsule (300 mg total) by mouth 3 (three) times daily. Qty: 270 capsule, Refills: 3    hydroxyurea (HYDREA) 500 MG capsule Take 2 capsules (1,000 mg total) by mouth daily. May take with food to minimize GI side effects. Qty: 60 capsule, Refills: 5   Associated Diagnoses: Hb-SS disease without crisis (HCC)    lisinopril (PRINIVIL,ZESTRIL) 5 MG tablet Take 1 tablet (5 mg total) by mouth daily. Qty: 30 tablet, Refills: 1    potassium chloride SA (K-DUR,KLOR-CON) 20 MEQ tablet Take 1 tablet (20 mEq total) by mouth every morning. Qty: 30 tablet, Refills: 3   Associated Diagnoses: Pulmonary HTN (HCC)    rivaroxaban (XARELTO) 20 MG TABS tablet Take 20 mg by mouth daily with supper.    torsemide (DEMADEX) 20 MG tablet Take 20 mg by mouth daily as needed. For increase in patient weight greater than 3 lbs.    HYDROmorphone (DILAUDID) 4 MG tablet Take 1 tablet (4 mg total) by mouth every 4 (four) hours as needed for severe pain. Qty: 90 tablet, Refills: 0   Associated Diagnoses: Sickle-cell disease with pain ([place]); Chronic pain syndrome    morphine (MS CONTIN) 30 MG 12 hr tablet Take 1 tablet (30 mg total) by mouth every 12 (twelve) hours. Qty: 60 tablet, Refills: 0   Associated Diagnoses: Chronic pain syndrome    tadalafil (CIALIS) 10 MG tablet Take 1 tablet (10 mg total) by mouth daily as needed for erectile dysfunction. Qty: 10 tablet, Refills: 0    zolpidem (AMBIEN) 10 MG tablet Take 1 tablet (10 mg total) by mouth at bedtime as needed for sleep. Qty: 30 tablet, Refills: 0   Associated Diagnoses: Insomnia       No Known Allergies   Significant Diagnostic Studies: Dg Chest 2 View  08/07/2015  CLINICAL DATA:  Sickle cell crisis with shortness of breath and cough EXAM: CHEST  2 VIEW COMPARISON:  08/02/2015 FINDINGS: Cardiac shadow remains enlarged. Left chest wall port is again seen and stable. Mild interstitial changes are noted throughout both lungs. No acute bony  abnormality is noted. IMPRESSION: Mild increased interstitial changes without focal infiltrate. Electronically Signed   By: Inez Catalina M.D.   On: 08/07/2015 14:13   Dg Chest 2 View  08/02/2015  CLINICAL DATA:  Acute onset of shortness of breath and lower chest pain. Initial encounter. EXAM: CHEST  2 VIEW COMPARISON:  Chest radiograph performed 07/18/2015 FINDINGS: The lungs are well-aerated and clear. There is no evidence of focal opacification, pleural effusion or pneumothorax. The heart is enlarged. A left-sided chest port is noted ending about the distal SVC. No acute osseous abnormalities are seen. IMPRESSION: Cardiomegaly.  Lungs remain grossly clear. Electronically Signed   By: Garald Balding M.D.   On: 08/02/2015 22:44   Dg Chest 2 View  07/18/2015  CLINICAL DATA:  Short of breath today. History of sickle cell disease. EXAM: CHEST  2 VIEW COMPARISON:  07/17/2015 FINDINGS: There is moderate enlargement of the cardiac silhouette, unchanged. No mediastinal or hilar masses or evidence of adenopathy. Mild right lung base scarring. There are prominent bronchovascular markings, but no evidence of pneumonia or pulmonary edema. No pleural effusion or pneumothorax. Left anterior chest wall Port-A-Cath is stable. Bony thorax is intact. No change from the prior  study. IMPRESSION: No acute cardiopulmonary disease. Electronically Signed   By: Lajean Manes M.D.   On: 07/18/2015 16:08   Dg Chest 2 View  07/17/2015  CLINICAL DATA:  Sickle cell crisis with pain and shortness of Breath EXAM: CHEST  2 VIEW COMPARISON:  07/09/2015 FINDINGS: Cardiac shadow is again enlarged. Left chest wall port is again seen and stable. No focal infiltrate or sizable effusion is noted. No acute bony abnormality is seen. IMPRESSION: No acute abnormality noted. Electronically Signed   By: Inez Catalina M.D.   On: 07/17/2015 14:50   Dg Chest Port 1 View  07/09/2015  CLINICAL DATA:  Shortness of breath.  Sickle cell disease EXAM: PORTABLE  CHEST 1 VIEW COMPARISON:  July 08, 2015 FINDINGS: Port-A-Cath tip is in the superior cava. No pneumothorax. There is no edema or consolidation. There is generalized cardiac enlargement. There is pulmonary venous hypertension. No adenopathy evident. No bone lesions. IMPRESSION: Cardiomegaly with pulmonary vascular congestion.  No consolidation. Electronically Signed   By: Lowella Grip III M.D.   On: 07/09/2015 08:04   Signed:  [name] MD, [place], [place], [place], CPE   08/07/2015, 3:10 PM

## 2015-08-08 LAB — CULTURE, BLOOD (ROUTINE X 2)
Culture: NO GROWTH
Culture: NO GROWTH

## 2015-08-10 DIAGNOSIS — D571 Sickle-cell disease without crisis: Secondary | ICD-10-CM | POA: Diagnosis not present

## 2015-08-11 DIAGNOSIS — Z7982 Long term (current) use of aspirin: Secondary | ICD-10-CM | POA: Diagnosis not present

## 2015-08-11 DIAGNOSIS — D571 Sickle-cell disease without crisis: Secondary | ICD-10-CM | POA: Diagnosis not present

## 2015-08-11 DIAGNOSIS — Z7901 Long term (current) use of anticoagulants: Secondary | ICD-10-CM | POA: Diagnosis not present

## 2015-08-11 DIAGNOSIS — Z79899 Other long term (current) drug therapy: Secondary | ICD-10-CM | POA: Diagnosis not present

## 2015-08-11 IMAGING — CR DG CHEST 2V
2 series · 2 of 2 positions shown · non-contrast
Comparison: 09/10/2012

CLINICAL DATA: Shortness of breath

CHEST - 2 VIEW

[w chest pa]
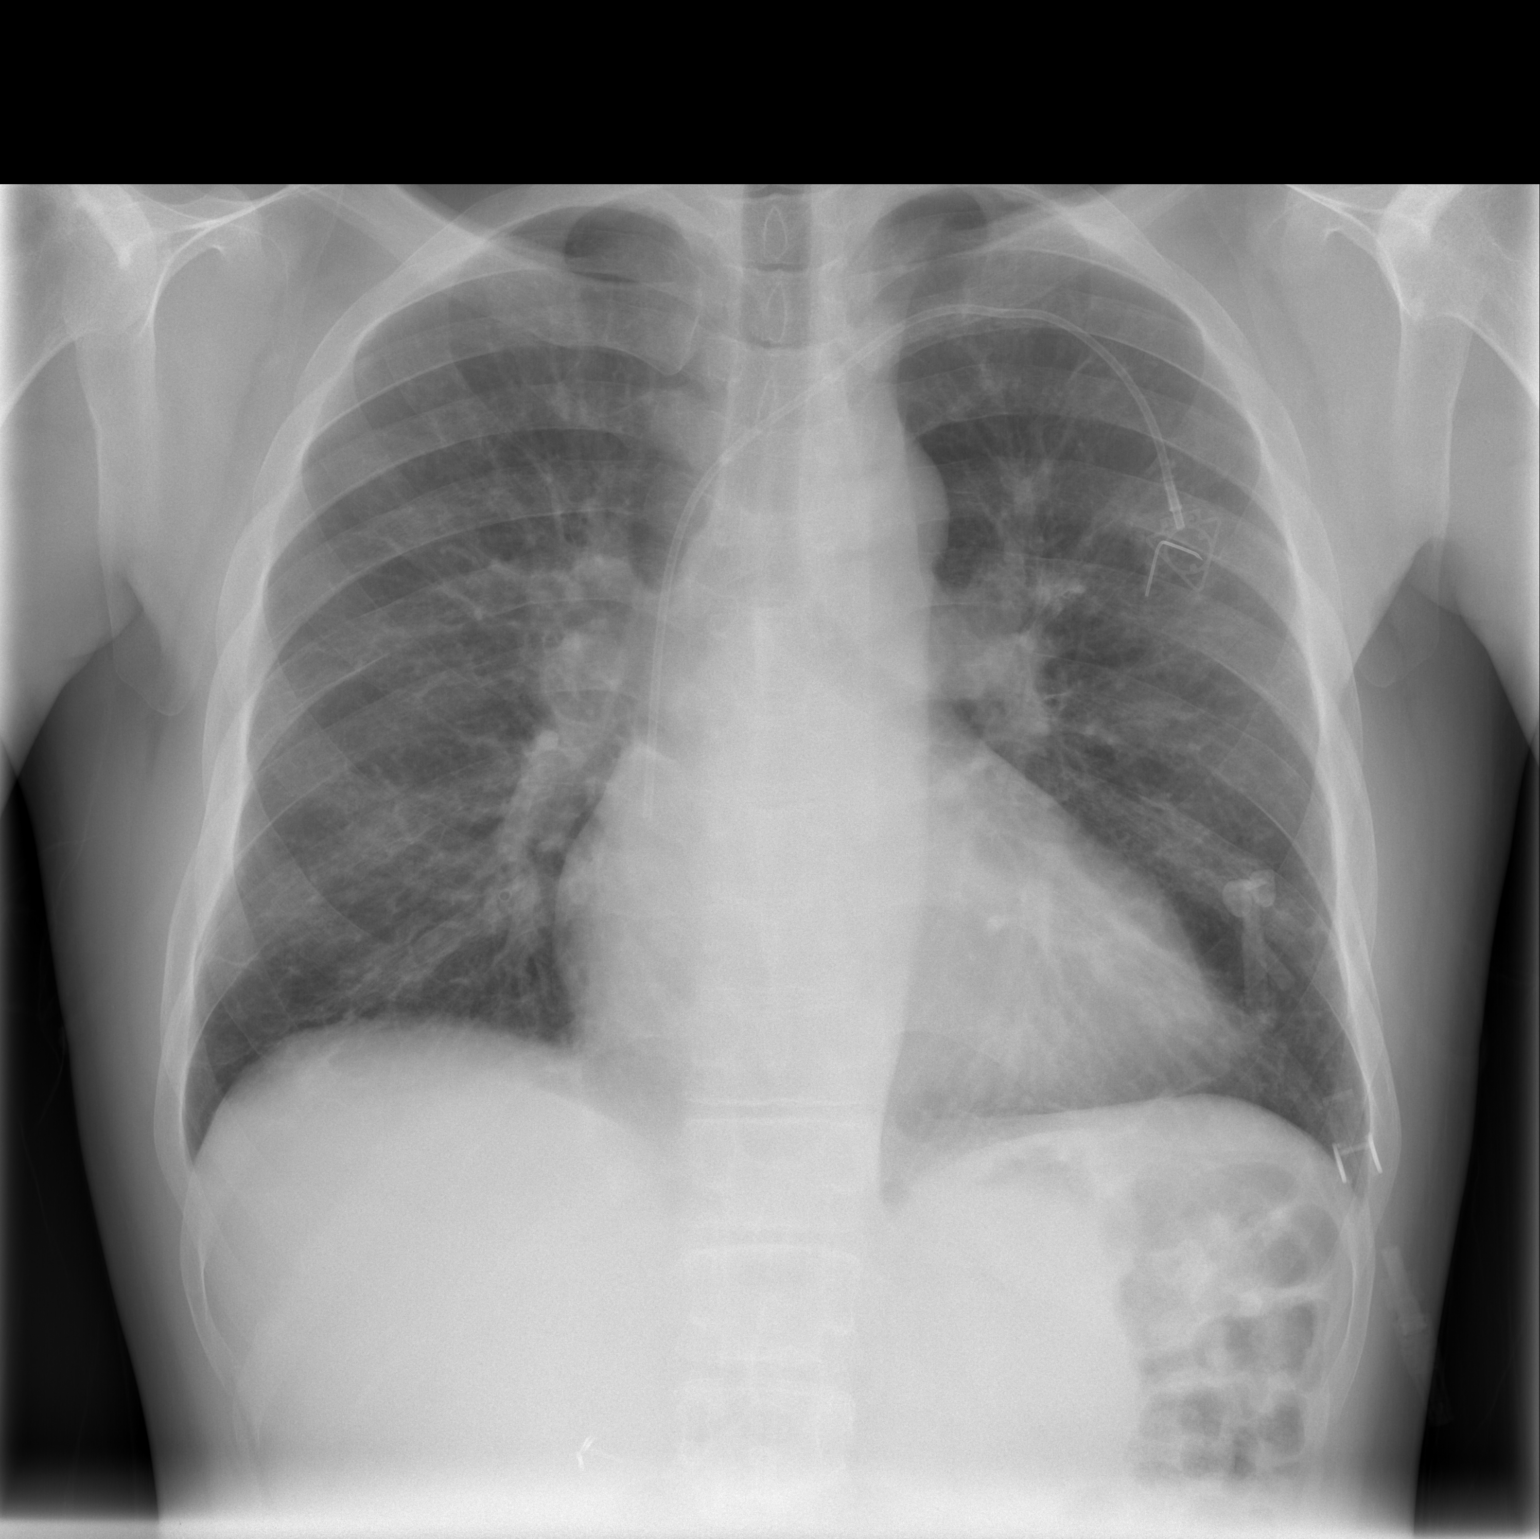

[w chest lat]
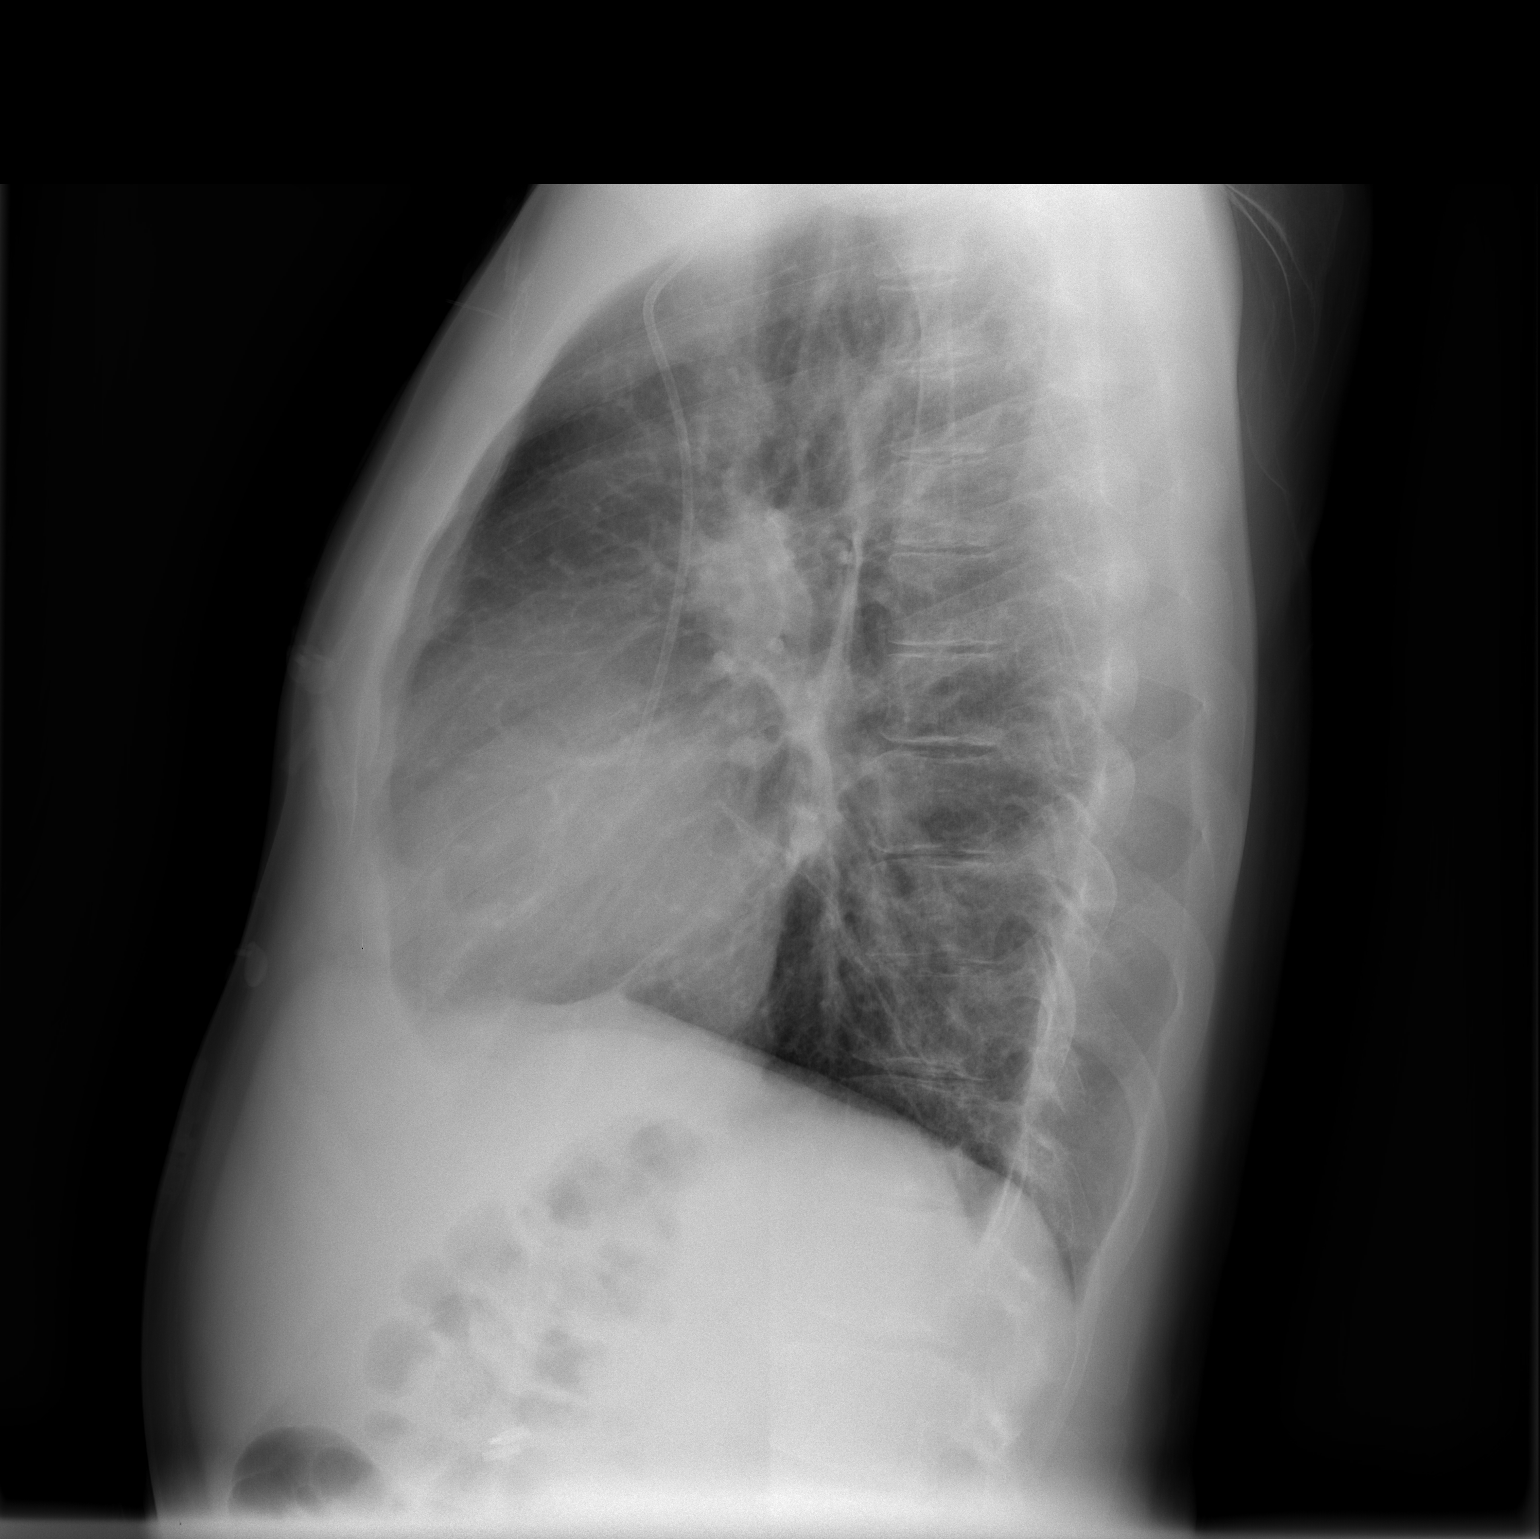

[2 of 2 positions shown; findings below may reference images not displayed]

FINDINGS: There is a left chest wall porta-catheter with tip in the
cavoatrial junction.  Heart size appears mildly enlarged.  No
pleural effusion or edema identified.  No airspace consolidation
noted.  Mild spondylosis identified within the thoracic spine.
IMPRESSION: 1.  No acute cardiopulmonary abnormalities.

## 2015-08-19 ENCOUNTER — Telehealth (HOSPITAL_COMMUNITY): Payer: Self-pay | Admitting: Hematology

## 2015-08-19 ENCOUNTER — Non-Acute Institutional Stay (HOSPITAL_COMMUNITY)
Admission: AD | Admit: 2015-08-19 | Discharge: 2015-08-19 | Disposition: A | Discharge status: Home / Self Care | Payer: Medicare Other | Source: Ambulatory Visit | Attending: Internal Medicine | Admitting: Internal Medicine

## 2015-08-19 ENCOUNTER — Encounter (HOSPITAL_COMMUNITY): Payer: Self-pay | Admitting: *Deleted

## 2015-08-19 DIAGNOSIS — I10 Essential (primary) hypertension: Secondary | ICD-10-CM | POA: Diagnosis not present

## 2015-08-19 DIAGNOSIS — Z7982 Long term (current) use of aspirin: Secondary | ICD-10-CM | POA: Insufficient documentation

## 2015-08-19 DIAGNOSIS — Z86718 Personal history of other venous thrombosis and embolism: Secondary | ICD-10-CM | POA: Insufficient documentation

## 2015-08-19 DIAGNOSIS — Z9981 Dependence on supplemental oxygen: Secondary | ICD-10-CM | POA: Diagnosis not present

## 2015-08-19 DIAGNOSIS — Z7901 Long term (current) use of anticoagulants: Secondary | ICD-10-CM | POA: Insufficient documentation

## 2015-08-19 DIAGNOSIS — Z86711 Personal history of pulmonary embolism: Secondary | ICD-10-CM | POA: Diagnosis not present

## 2015-08-19 DIAGNOSIS — Z79891 Long term (current) use of opiate analgesic: Secondary | ICD-10-CM | POA: Diagnosis not present

## 2015-08-19 DIAGNOSIS — Z79899 Other long term (current) drug therapy: Secondary | ICD-10-CM | POA: Diagnosis not present

## 2015-08-19 DIAGNOSIS — Z96641 Presence of right artificial hip joint: Secondary | ICD-10-CM | POA: Diagnosis not present

## 2015-08-19 DIAGNOSIS — Z87891 Personal history of nicotine dependence: Secondary | ICD-10-CM | POA: Diagnosis not present

## 2015-08-19 DIAGNOSIS — M25512 Pain in left shoulder: Secondary | ICD-10-CM | POA: Diagnosis present

## 2015-08-19 DIAGNOSIS — D5700 Hb-SS disease with crisis, unspecified: Secondary | ICD-10-CM | POA: Diagnosis not present

## 2015-08-19 DIAGNOSIS — J9610 Chronic respiratory failure, unspecified whether with hypoxia or hypercapnia: Secondary | ICD-10-CM | POA: Diagnosis not present

## 2015-08-19 LAB — CBC WITH DIFFERENTIAL/PLATELET
BASOS ABS: 0.1 10*3/uL (ref 0.0–0.1)
BASOS PCT: 1 %
EOS ABS: 0.4 10*3/uL (ref 0.0–0.7)
EOS PCT: 3 %
HCT: 23 % — ABNORMAL LOW (ref 39.0–52.0)
HEMOGLOBIN: 7.6 g/dL — AB (ref 13.0–17.0)
LYMPHS ABS: 1.8 10*3/uL (ref 0.7–4.0)
Lymphocytes Relative: 13 %
MCH: 31 pg (ref 26.0–34.0)
MCHC: 33 g/dL (ref 30.0–36.0)
MCV: 93.9 fL (ref 78.0–100.0)
Monocytes Absolute: 1.3 10*3/uL — ABNORMAL HIGH (ref 0.1–1.0)
Monocytes Relative: 10 %
NEUTROS PCT: 73 %
Neutro Abs: 10.1 10*3/uL — ABNORMAL HIGH (ref 1.7–7.7)
PLATELETS: 366 10*3/uL (ref 150–400)
RBC: 2.45 MIL/uL — AB (ref 4.22–5.81)
RDW: 20.7 % — ABNORMAL HIGH (ref 11.5–15.5)
WBC: 13.6 10*3/uL — AB (ref 4.0–10.5)

## 2015-08-19 LAB — RETICULOCYTES
RBC.: 2.45 MIL/uL — AB (ref 4.22–5.81)
RETIC COUNT ABSOLUTE: 98 10*3/uL (ref 19.0–186.0)
RETIC CT PCT: 4 % — AB (ref 0.4–3.1)

## 2015-08-19 MED ORDER — HYDROMORPHONE 1 MG/ML IV SOLN
INTRAVENOUS | Status: DC
  Administered 2015-08-19: 6.3 mg via INTRAVENOUS
  Administered 2015-08-19: 09:00:00 via INTRAVENOUS
  Administered 2015-08-19: 7.8 mg via INTRAVENOUS
  Filled 2015-08-19: qty 25

## 2015-08-19 MED ORDER — SODIUM CHLORIDE 0.9 % IV SOLN
25.0000 mg | INTRAVENOUS | Status: DC | PRN
  Filled 2015-08-19: qty 0.5

## 2015-08-19 MED ORDER — POLYETHYLENE GLYCOL 3350 17 G PO PACK
17.0000 g | PACK | Freq: Every day | ORAL | Status: DC | PRN
  Filled 2015-08-19: qty 1

## 2015-08-19 MED ORDER — ONDANSETRON HCL 4 MG/2ML IJ SOLN: 4.0000 mg | Freq: Four times a day (QID) | INTRAMUSCULAR | Status: DC | PRN

## 2015-08-19 MED ORDER — DIPHENHYDRAMINE HCL 25 MG PO CAPS: 25.0000 mg | ORAL_CAPSULE | ORAL | Status: DC | PRN

## 2015-08-19 MED ORDER — SENNOSIDES-DOCUSATE SODIUM 8.6-50 MG PO TABS
1.0000 | ORAL_TABLET | Freq: Two times a day (BID) | ORAL | Status: DC
  Administered 2015-08-19: 1 via ORAL
  Filled 2015-08-19: qty 1

## 2015-08-19 MED ORDER — NALOXONE HCL 0.4 MG/ML IJ SOLN: 0.4000 mg | INTRAMUSCULAR | Status: DC | PRN

## 2015-08-19 MED ORDER — SODIUM CHLORIDE 0.9% FLUSH
10.0000 mL | INTRAVENOUS | Status: AC | PRN
  Administered 2015-08-19: 10 mL

## 2015-08-19 MED ORDER — SODIUM CHLORIDE 0.9% FLUSH: 9.0000 mL | INTRAVENOUS | Status: DC | PRN

## 2015-08-19 MED ORDER — HEPARIN SOD (PORK) LOCK FLUSH 100 UNIT/ML IV SOLN
500.0000 [IU] | INTRAVENOUS | Status: AC | PRN
  Administered 2015-08-19: 500 [IU]
  Filled 2015-08-19: qty 5

## 2015-08-19 MED ORDER — KETOROLAC TROMETHAMINE 30 MG/ML IJ SOLN
30.0000 mg | Freq: Four times a day (QID) | INTRAMUSCULAR | Status: DC
  Administered 2015-08-19: 30 mg via INTRAVENOUS
  Filled 2015-08-19: qty 1

## 2015-08-19 MED ORDER — DEXTROSE-NACL 5-0.45 % IV SOLN
INTRAVENOUS | Status: DC
  Administered 2015-08-19: 09:00:00 via INTRAVENOUS

## 2015-08-19 NOTE — Telephone Encounter (Signed)
Patient C/O pain to ribs bilaterally.  Patient rates pain 8/10 on pain scale.  Patient states he has taken dilaudid and MS contin around 0600 with no improvement.  Patient denies chest pain or shortness of breath, difficulty breathing abdominal pain.  Patient is [facility].  I advised I would notify the provider and give him a call back.  Patient verbalizes understanding.

## 2015-08-19 NOTE — Discharge Summary (Signed)
Physician Discharge Summary  TEE NORTHUP [id number] DOB: [birth year] DOA: 08/19/2015  PCP: [name], MD  Admit date: 08/19/2015  Discharge date: 08/19/2015  Time spent: 30 minutes  Discharge Diagnoses:  Active Problems:   Sickle cell anemia with pain [facility])   Discharge Condition: Stable  Diet recommendation: Regular  Filed Weights   08/19/15 0829  Weight: 168 lb (76.204 kg)   History of present illness:  [name] is a 35 y.o. male with history of sickle cell disease, acute chest syndrome, DVT, PE on Xarelto, pulmonary hypertension, cor pulmonale, chronic respiratory failure on home oxygen, nonadherent with oxygen use and medications for SCD except the narcotics. He presented to the day hospital today with C/O pain to shoulders, and bilateral ribs. Patient rates pain at 8/10 on pain scale. Patient denies chest pain, shortness of breath, difficulty breathing. Patient denies N/V/D and abdominal pain. Patient states he has taken his home pain medication without improvement for several days.  Hospital Course:  AASIN DENHART was admitted to the day hospital with sickle cell painful crisis. Patient was treated with weight based IV Dilaudid PCA, IV Toradol as well as IV fluids. Javares showed significant improvement symptomatically, pain improved from 8 to 5 out of 10 at the time of discharge. Zylan will follow-up at the clinic as previously scheduled, continue with home medications as per prior to admission.  Discharge Exam: Filed Vitals:   08/19/15 1226 08/19/15 1326  BP: 122/60 118/60  Pulse: 94 93  Temp:    Resp: 20 20    General appearance: alert, cooperative and no distress Eyes: conjunctivae/corneas clear. PERRL, EOM's intact. Fundi benign. Neck: no adenopathy, no carotid bruit, no JVD, supple, symmetrical, trachea midline and thyroid not enlarged, symmetric, no tenderness/mass/nodules Back: symmetric, no curvature. ROM normal. No CVA  tenderness. Resp: clear to auscultation bilaterally Chest wall: no tenderness Cardio: regular rate and rhythm, S1, S2 normal, no murmur, click, rub or gallop GI: soft, non-tender; bowel sounds normal; no masses, no organomegaly Extremities: extremities normal, atraumatic, no cyanosis or edema Pulses: 2+ and symmetric Skin: Skin color, texture, turgor normal. No rashes or lesions Neurologic: Grossly normal  Discharge Instructions We discussed the need for good hydration, monitoring of hydration status, avoidance of heat, cold, stress, and infection triggers. We discussed the need to be compliant with taking Hydrea. Aysen was reminded of the need to seek medical attention of any symptoms of bleeding, anemia, or infection occurs.   Current Discharge Medication List    CONTINUE these medications which have NOT CHANGED   Details  acetaminophen (TYLENOL) 325 MG tablet Take 650 mg by mouth every 6 (six) hours as needed for moderate pain.    aspirin 81 MG chewable tablet Chew 1 tablet (81 mg total) by mouth daily. Qty: 30 tablet, Refills: 11    Cholecalciferol (VITAMIN D) 2000 units tablet Take 1 tablet (2,000 Units total) by mouth daily. Qty: 90 tablet, Refills: 3   Associated Diagnoses: Vitamin D deficiency    Deferasirox (JADENU) 360 MG TABS Take 1,080 mg by mouth 2 (two) times daily.     folic acid (FOLVITE) 1 MG tablet Take 1 tablet (1 mg total) by mouth every morning. Qty: 30 tablet, Refills: 11   Associated Diagnoses: Hb-SS disease without crisis ([place])    gabapentin (NEURONTIN) 300 MG capsule Take 1 capsule (300 mg total) by mouth 3 (three) times daily. Qty: 270 capsule, Refills: 3    HYDROmorphone (DILAUDID) 4 MG tablet Take 1 tablet (4  mg total) by mouth every 4 (four) hours as needed for severe pain. Qty: 90 tablet, Refills: 0   Associated Diagnoses: Sickle-cell disease with pain ([place]); Chronic pain syndrome    hydroxyurea (HYDREA) 500 MG capsule Take 2 capsules (1,000 mg  total) by mouth daily. May take with food to minimize GI side effects. Qty: 60 capsule, Refills: 5   Associated Diagnoses: Hb-SS disease without crisis (HCC)    lisinopril (PRINIVIL,ZESTRIL) 5 MG tablet Take 1 tablet (5 mg total) by mouth daily. Qty: 30 tablet, Refills: 1    morphine (MS CONTIN) 30 MG 12 hr tablet Take 1 tablet (30 mg total) by mouth every 12 (twelve) hours. Qty: 60 tablet, Refills: 0   Associated Diagnoses: Chronic pain syndrome    rivaroxaban (XARELTO) 20 MG TABS tablet Take 20 mg by mouth daily with supper.    torsemide (DEMADEX) 20 MG tablet Take 20 mg by mouth daily as needed. For increase in patient weight greater than 3 lbs.    zolpidem (AMBIEN) 10 MG tablet Take 1 tablet (10 mg total) by mouth at bedtime as needed for sleep. Qty: 30 tablet, Refills: 0   Associated Diagnoses: Insomnia    potassium chloride SA (K-DUR,KLOR-CON) 20 MEQ tablet Take 1 tablet (20 mEq total) by mouth every morning. Qty: 30 tablet, Refills: 3   Associated Diagnoses: Pulmonary HTN (HCC)    tadalafil (CIALIS) 10 MG tablet Take 1 tablet (10 mg total) by mouth daily as needed for erectile dysfunction. Qty: 10 tablet, Refills: 0       No Known Allergies   Significant Diagnostic Studies: Dg Chest 2 View  08/07/2015  CLINICAL DATA:  Sickle cell crisis with shortness of breath and cough EXAM: CHEST  2 VIEW COMPARISON:  08/02/2015 FINDINGS: Cardiac shadow remains enlarged. Left chest wall port is again seen and stable. Mild interstitial changes are noted throughout both lungs. No acute bony abnormality is noted. IMPRESSION: Mild increased interstitial changes without focal infiltrate. Electronically Signed   By: Inez Catalina M.D.   On: 08/07/2015 14:13   Dg Chest 2 View  08/02/2015  CLINICAL DATA:  Acute onset of shortness of breath and lower chest pain. Initial encounter. EXAM: CHEST  2 VIEW COMPARISON:  Chest radiograph performed 07/18/2015 FINDINGS: The lungs are well-aerated and clear.  There is no evidence of focal opacification, pleural effusion or pneumothorax. The heart is enlarged. A left-sided chest port is noted ending about the distal SVC. No acute osseous abnormalities are seen. IMPRESSION: Cardiomegaly.  Lungs remain grossly clear. Electronically Signed   By: Garald Balding M.D.   On: 08/02/2015 22:44    Signed:  [name] MD, [place], [place], [place], CPE   08/19/2015, 3:46 PM

## 2015-08-19 NOTE — H&P (Signed)
[facility] History and Physical  [name] [id number] DOB: [birth year] DOA: 08/19/2015  PCP: [name], MD   Chief Complaint: Pain in shoulders and ribs  HPI: [name] is a 35 y.o. male with history of sickle cell disease, acute chest syndrome, DVT, PE on Xarelto, pulmonary hypertension, cor pulmonale, chronic respiratory failure on home oxygen, nonadherent with oxygen use and medications for SCD except the narcotics. He presented to the day hospital today with C/O pain to shoulders, and bilateral ribs. Patient rates pain at 8/10 on pain scale. Patient denies chest pain, shortness of breath, difficulty breathing. Patient denies N/V/D and abdominal pain. Patient states he has taken his home pain medication without improvement for several days.  Systemic Review: General: The patient denies anorexia, fever, weight loss Cardiac: Denies chest pain, syncope, palpitations, pedal edema  Respiratory: Denies cough, shortness of breath, wheezing GI: Denies severe indigestion/heartburn, abdominal pain, nausea, vomiting, diarrhea and constipation GU: Denies hematuria, incontinence, dysuria  Musculoskeletal: Denies arthritis  Skin: Denies suspicious skin lesions Neurologic: Denies focal weakness or numbness, change in vision  Past Medical History  Diagnosis Date  . Sickle cell anemia (HCC)   . Blood transfusion   . Acute embolism and thrombosis of right internal jugular vein ([place])   . Hypokalemia   . Mood disorder ([place])   . History of pulmonary embolus (PE)   . Avascular necrosis (HCC)     Right Hip  . Leukocytosis     Chronic  . Thrombocytosis (HCC)     Chronic  . Hypertension   . History of Clostridium difficile infection   . Uses marijuana   . Chronic anticoagulation   . Functional asplenia   . Former smoker   . Second hand tobacco smoke exposure   . Alcohol consumption of one to four drinks per day   . Noncompliance with medication  regimen   . Sickle-cell crisis with associated acute chest syndrome ([place]) 05/13/2013  . Acute chest syndrome ([place]) 06/18/2013  . Demand ischemia ([place]) 01/02/2014  . Pulmonary hypertension ([place])   . Hb-SS disease with crisis ([place])   . Oxygen deficiency     Past Surgical History  Procedure Laterality Date  . Right hip replacement      08/2006  . Cholecystectomy      01/2008  . Porta cath placement    . Porta cath removal    . Umbilical hernia repair      01/2008  . Excision of left periauricular cyst      10/2009  . Excision of right ear lobe cyst with primary closur      11/2007  . Portacath placement  01/05/2012    Procedure: INSERTION PORT-A-CATH;  Surgeon: Odis Hollingshead, MD;  Location: [facility];  Service: General;  Laterality: N/A;  ultrasound guiced port a cath insertion with fluoroscopy    No Known Allergies  Family History  Problem Relation Age of Onset  . Sickle cell trait Mother   . Depression Mother   . Diabetes Mother   . Sickle cell trait Father   . Sickle cell trait Brother       Prior to Admission medications   Medication Sig Start Date End Date Taking? Authorizing Provider  acetaminophen (TYLENOL) 325 MG tablet Take 650 mg by mouth every 6 (six) hours as needed for moderate pain.   Yes Historical Provider, MD  aspirin 81 MG chewable tablet Chew 1 tablet (81 mg total) by mouth daily. 07/23/14  Yes [name], MD  Cholecalciferol (VITAMIN D) 2000 units tablet Take 1 tablet (2,000 Units total) by mouth daily. 02/23/15  Yes Dorena Dew, FNP  Deferasirox (JADENU) 360 MG TABS Take 1,080 mg by mouth 2 (two) times daily.    Yes Historical Provider, MD  folic acid (FOLVITE) 1 MG tablet Take 1 tablet (1 mg total) by mouth every morning. 02/23/15  Yes Dorena Dew, FNP  gabapentin (NEURONTIN) 300 MG capsule Take 1 capsule (300 mg total) by mouth 3 (three) times daily. 06/01/15  Yes Tresa Garter, MD  HYDROmorphone (DILAUDID) 4 MG tablet Take 1 tablet (4  mg total) by mouth every 4 (four) hours as needed for severe pain. 08/07/15  Yes Tresa Garter, MD  hydroxyurea (HYDREA) 500 MG capsule Take 2 capsules (1,000 mg total) by mouth daily. May take with food to minimize GI side effects. Patient taking differently: Take 1,500 mg by mouth daily. May take with food to minimize GI side effects. 06/01/15  Yes Tresa Garter, MD  lisinopril (PRINIVIL,ZESTRIL) 5 MG tablet Take 1 tablet (5 mg total) by mouth daily. 08/04/15  Yes [name], MD  morphine (MS CONTIN) 30 MG 12 hr tablet Take 1 tablet (30 mg total) by mouth every 12 (twelve) hours. 08/07/15  Yes Tresa Garter, MD  rivaroxaban (XARELTO) 20 MG TABS tablet Take 20 mg by mouth daily with supper.   Yes Historical Provider, MD  torsemide (DEMADEX) 20 MG tablet Take 20 mg by mouth daily as needed. For increase in patient weight greater than 3 lbs. 07/27/15 07/26/16 Yes Historical Provider, MD  zolpidem (AMBIEN) 10 MG tablet Take 1 tablet (10 mg total) by mouth at bedtime as needed for sleep. 08/07/15  Yes Tresa Garter, MD  potassium chloride SA (K-DUR,KLOR-CON) 20 MEQ tablet Take 1 tablet (20 mEq total) by mouth every morning. Patient taking differently: Take 20 mEq by mouth daily.  04/13/15   Dorena Dew, FNP  tadalafil (CIALIS) 10 MG tablet Take 1 tablet (10 mg total) by mouth daily as needed for erectile dysfunction. 08/07/15   Tresa Garter, MD     Physical Exam: Filed Vitals:   08/19/15 0829  BP: 116/70  Pulse: 95  Temp: 98.3 F (36.8 C)  TempSrc: Oral  Resp: 20  Height: 6' (1.829 m)  Weight: 168 lb (76.204 kg)  SpO2: 95%    General: Alert, awake, afebrile, anicteric, not in obvious distress HEENT: Normocephalic and Atraumatic, Mucous membranes pink                PERRLA; EOM intact; No scleral icterus,                 Nares: Patent, Oropharynx: Clear, Fair Dentition                 Neck: FROM, no cervical lymphadenopathy, thyromegaly, carotid bruit or JVD;   CHEST WALL: No tenderness  CHEST: Normal respiration, clear to auscultation bilaterally  HEART: Regular rate and rhythm; no murmurs rubs or gallops  BACK: No kyphosis or scoliosis; no CVA tenderness  ABDOMEN: Positive Bowel Sounds, soft, non-tender; no masses, no organomegaly EXTREMITIES: No cyanosis, clubbing, or edema SKIN:  no rash or ulceration  CNS: Alert and Oriented x 4, Nonfocal exam, CN 2-12 intact  Labs on Admission:  Basic Metabolic Panel: No results for input(s): NA, K, CL, CO2, GLUCOSE, BUN, CREATININE, CALCIUM, MG, PHOS in the last 168 hours. Liver Function Tests: No  results for input(s): AST, ALT, ALKPHOS, BILITOT, PROT, ALBUMIN in the last 168 hours. No results for input(s): LIPASE, AMYLASE in the last 168 hours. No results for input(s): AMMONIA in the last 168 hours. CBC: No results for input(s): WBC, NEUTROABS, HGB, HCT, MCV, PLT in the last 168 hours. Cardiac Enzymes: No results for input(s): CKTOTAL, CKMB, CKMBINDEX, TROPONINI in the last 168 hours.  BNP (last 3 results)  Recent Labs  09/10/14 0612 11/30/14 0910 08/02/15 2307  BNP 549.5* 639.7* 503.2*    ProBNP (last 3 results) No results for input(s): PROBNP in the last 8760 hours.  CBG: No results for input(s): GLUCAP in the last 168 hours.   Assessment/Plan Active Problems:   Sickle cell anemia with pain ([place])   Admits to the Day Hospital  IVF D5 .45% Saline @ 150 mls/hour  Weight based Dilaudid PCA started within 30 minutes of admission  IV Toradol 30 mg Q 6 H  Monitor vitals very closely, Re-evaluate pain scale every hour  2 L of Oxygen by [place]  Patient will be re-evaluated for pain in the context of function and relationship to baseline as care progresses.  If no significant relieve from pain (remains above 5/10) will transfer patient to inpatient services for further evaluation and management  Code Status: Full  Family Communication: None  DVT Prophylaxis: Ambulate as  tolerated   Time spent: 73 Minutes  Sameeha Rockefeller, MD, MHA, FACP, FAAP, CPE  If 7PM-7AM, please contact night-coverage [email or web address] 08/19/2015, 8:43 AM

## 2015-08-19 NOTE — Telephone Encounter (Signed)
Spoke with Dr. Doreene Burke and it is ok for patient to come to [facility].

## 2015-08-19 NOTE — Discharge Instructions (Signed)
Sickle Cell Anemia, Adult Sickle cell anemia is a condition in which red blood cells have an abnormal "sickle" shape. This abnormal shape shortens the cells' life span, which results in a lower than normal concentration of red blood cells in the blood. The sickle shape also causes the cells to clump together and block free blood flow through the blood vessels. As a result, the tissues and organs of the body do not receive enough oxygen. Sickle cell anemia causes organ damage and pain and increases the risk of infection. CAUSES  Sickle cell anemia is a genetic disorder. Those who receive two copies of the gene have the condition, and those who receive one copy have the trait. RISK FACTORS The sickle cell gene is most common in people whose families originated in [place]. Other areas of the globe where sickle cell trait occurs include the [place], [place] and [place], the [place], and the [place].  SIGNS AND SYMPTOMS  Pain, especially in the extremities, back, chest, or abdomen (common). The pain may start suddenly or may develop following an illness, especially if there is dehydration. Pain can also occur due to overexertion or exposure to extreme temperature changes.  Frequent severe bacterial infections, especially certain types of pneumonia and meningitis.  Pain and swelling in the hands and feet.  Decreased activity.   Loss of appetite.   Change in behavior.  Headaches.  Seizures.  Shortness of breath or difficulty breathing.  Vision changes.  Skin ulcers. Those with the trait may not have symptoms or they may have mild symptoms.  DIAGNOSIS  Sickle cell anemia is diagnosed with blood tests that demonstrate the genetic trait. It is often diagnosed during the newborn period, due to mandatory testing nationwide. A variety of blood tests, X-rays, CT scans, MRI scans, ultrasounds, and lung function tests may also be done to monitor the condition. TREATMENT  Sickle  cell anemia may be treated with:  Medicines. You may be given pain medicines, antibiotic medicines (to treat and prevent infections) or medicines to increase the production of certain types of hemoglobin.  Fluids.  Oxygen.  Blood transfusions. HOME CARE INSTRUCTIONS   Drink enough fluid to keep your urine clear or pale yellow. Increase your fluid intake in hot weather and during exercise.  Do not smoke. Smoking lowers oxygen levels in the blood.   Only take over-the-counter or prescription medicines for pain, fever, or discomfort as directed by your health care provider.  Take antibiotics as directed by your health care provider. Make sure you finish them it even if you start to feel better.   Take supplements as directed by your health care provider.   Consider wearing a medical alert bracelet. This tells anyone caring for you in an emergency of your condition.   When traveling, keep your medical information, health care provider's names, and the medicines you take with you at all times.   If you develop a fever, do not take medicines to reduce the fever right away. This could cover up a problem that is developing. Notify your health care provider.  Keep all follow-up appointments with your health care provider. Sickle cell anemia requires regular medical care. SEEK MEDICAL CARE IF: You have a fever. SEEK IMMEDIATE MEDICAL CARE IF:   You feel dizzy or faint.   You have new abdominal pain, especially on the left side near the stomach area.   You develop a persistent, often uncomfortable and painful penile erection (priapism). If this is not treated immediately it   will lead to impotence.   You have numbness your arms or legs or you have a hard time moving them.   You have a hard time with speech.   You have a fever or persistent symptoms for more than 2-3 days.   You have a fever and your symptoms suddenly get worse.   You have signs or symptoms of infection.  These include:   Chills.   Abnormal tiredness (lethargy).   Irritability.   Poor eating.   Vomiting.   You develop pain that is not helped with medicine.   You develop shortness of breath.  You have pain in your chest.   You are coughing up pus-like or bloody sputum.   You develop a stiff neck.  Your feet or hands swell or have pain.  Your abdomen appears bloated.  You develop joint pain. MAKE SURE YOU:  Understand these instructions.   This information is not intended to replace advice given to you by your health care provider. Make sure you discuss any questions you have with your health care provider.   Document Released: 04/27/2005 Document Revised: 02/07/2014 Document Reviewed: 08/29/2012 Elsevier Interactive Patient Education 2016 [employer]  

## 2015-08-19 NOTE — Progress Notes (Signed)
Admitted to [facility] for complaints of pain in bilateral rib cage. Rating pain 8/10. Treated with IV fluids, IV Toradol and Dilaudid PCA. Pain level down to 5 at time of discharge. Oximeter 94% on RA at time of discharge.[name] over discharge instructions and copy given to patient. Alert, oriented and ambulatory at time of discharge. Patient has ride waiting to take him home.

## 2015-08-21 IMAGING — CR DG CHEST 2V
2 series · 2 of 2 positions shown · non-contrast
Comparison: 09/20/2012; 08/18/2012; chest CT - 08/27/2012

CLINICAL DATA: Sickle cell crisis

CHEST - 2 VIEW

[w chest pa]
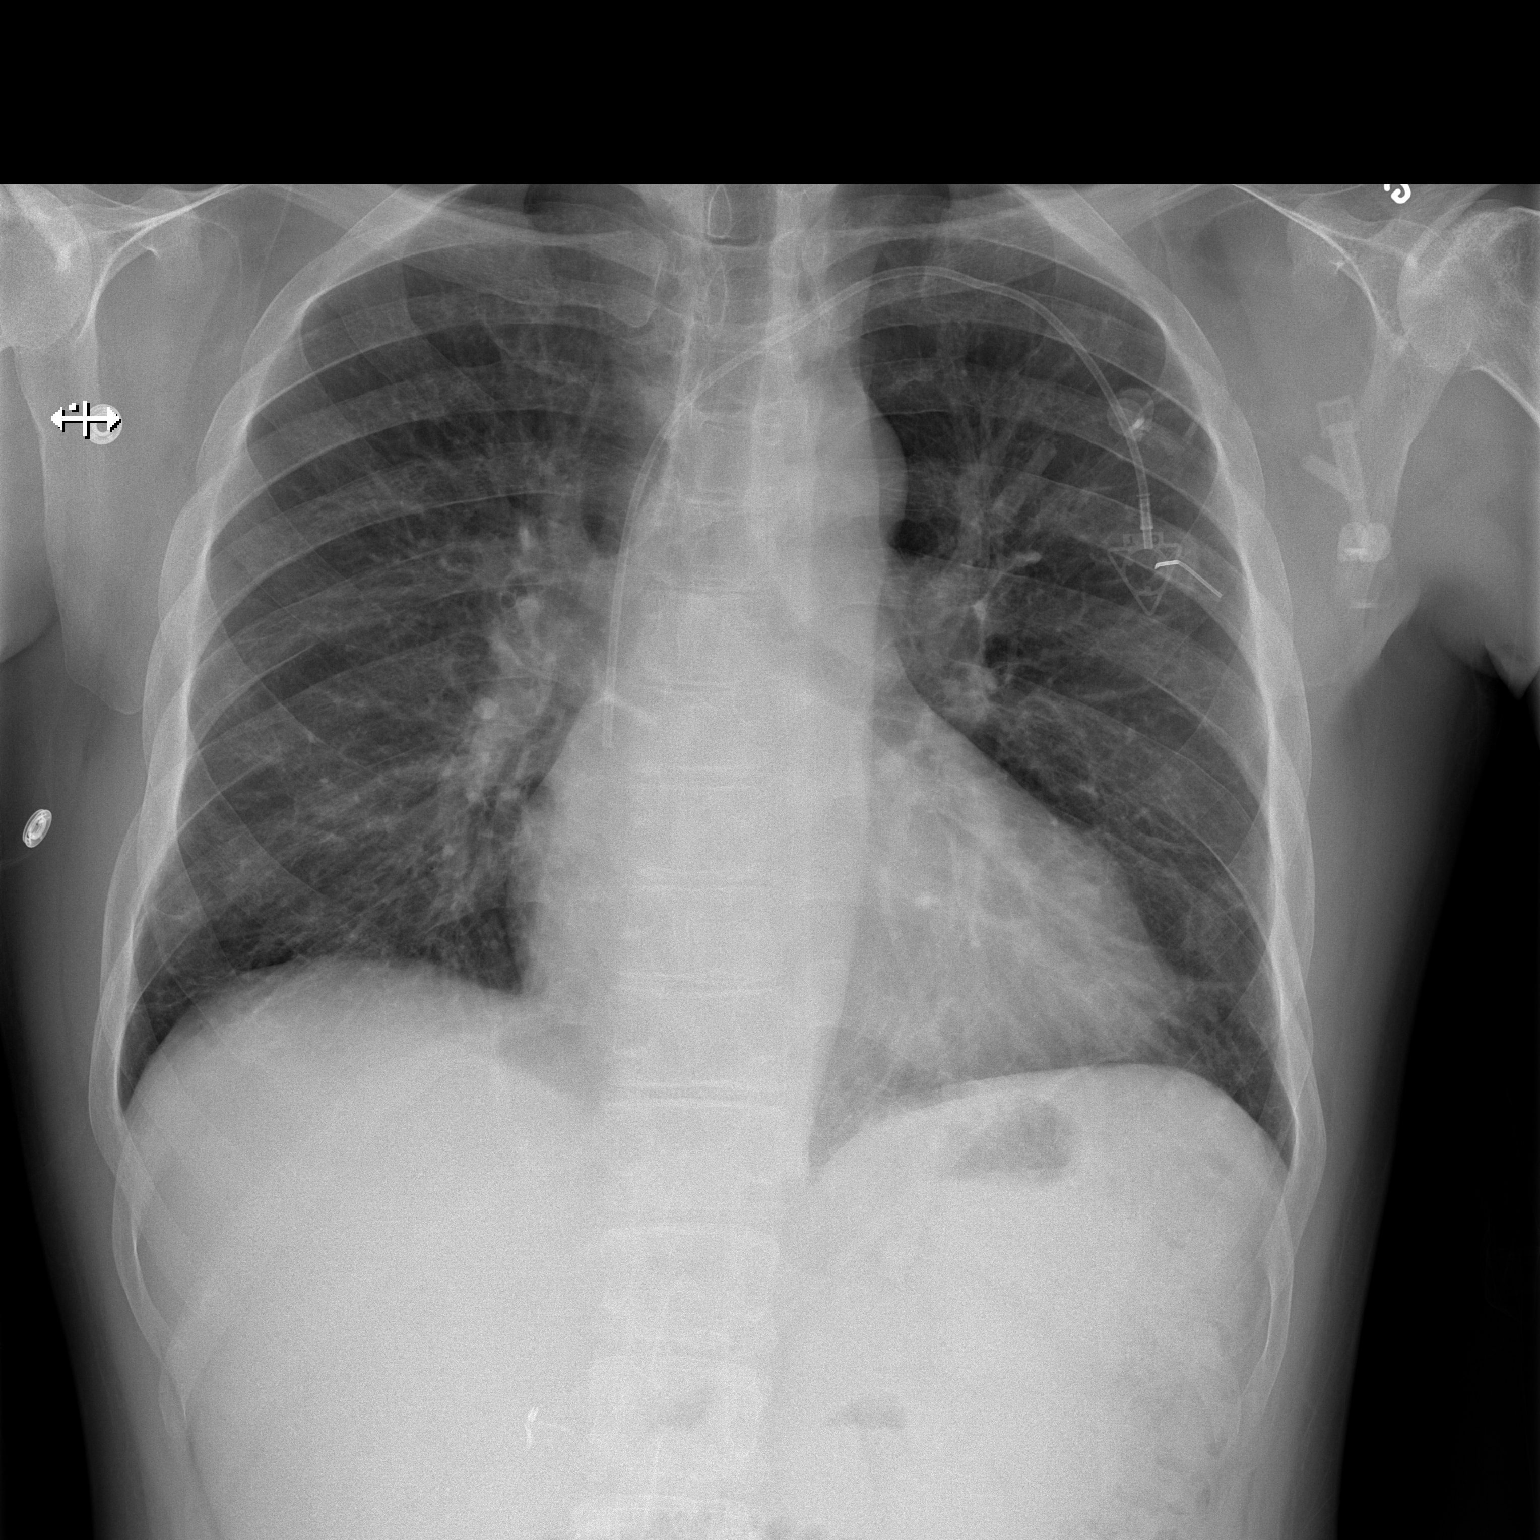

[w chest lat]
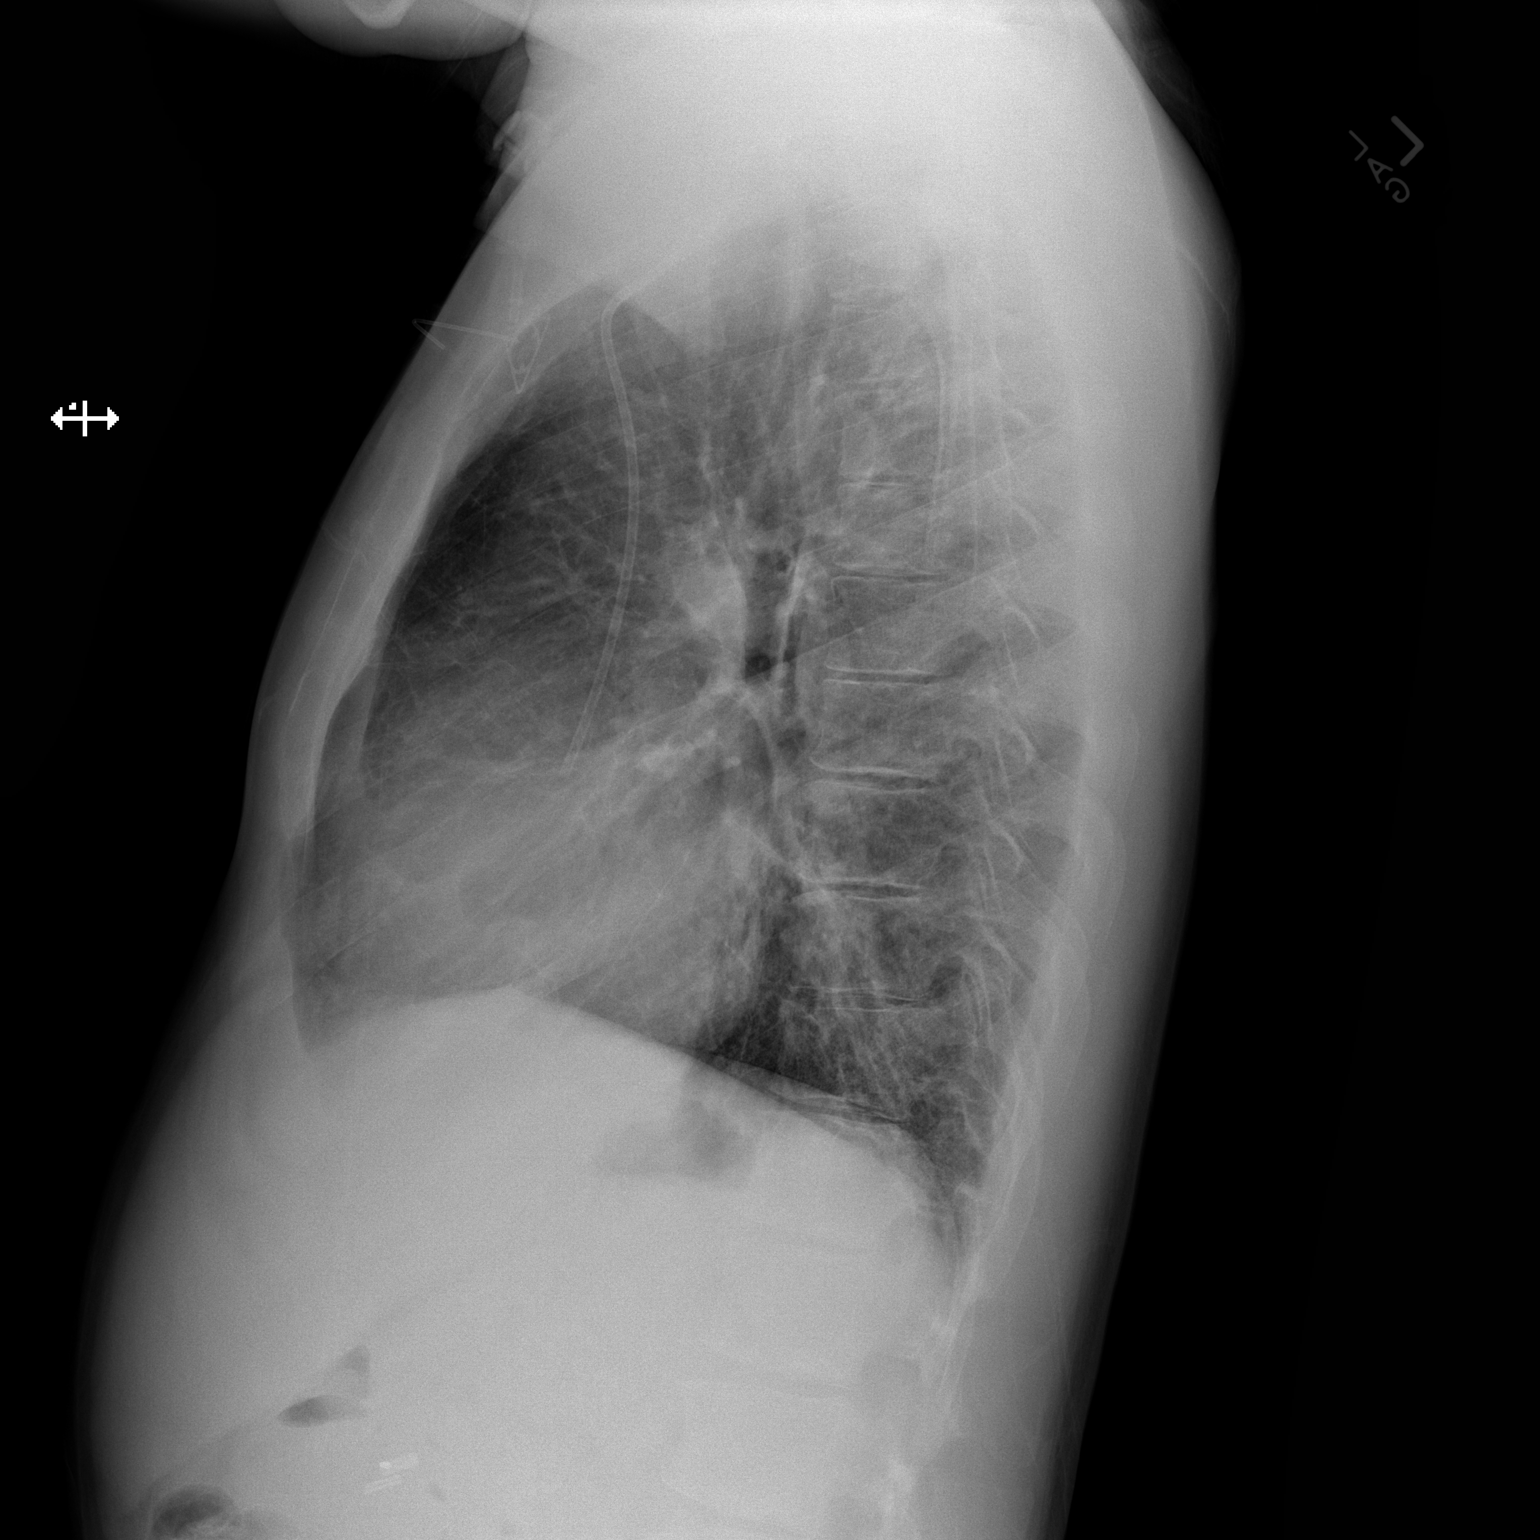

[2 of 2 positions shown; findings below may reference images not displayed]

FINDINGS: Grossly unchanged borderline enlarged cardiac silhouette and
mediastinal contours.  Stable positioning of support apparatus.
There is grossly unchanged mild diffuse slightly nodular thickening
of the pulmonary interstitium.  No focal airspace opacities.  No
pleural effusion or pneumothorax.  No definite evidence of edema.
Grossly unchanged bones.  Post cholecystectomy.
IMPRESSION: Mild bronchitic change without acute cardiopulmonary disease.

## 2015-08-26 DIAGNOSIS — D5700 Hb-SS disease with crisis, unspecified: Secondary | ICD-10-CM | POA: Diagnosis not present

## 2015-08-26 DIAGNOSIS — I272 Other secondary pulmonary hypertension: Secondary | ICD-10-CM | POA: Diagnosis not present

## 2015-08-26 DIAGNOSIS — Z79899 Other long term (current) drug therapy: Secondary | ICD-10-CM | POA: Diagnosis not present

## 2015-08-26 DIAGNOSIS — R0602 Shortness of breath: Secondary | ICD-10-CM | POA: Diagnosis not present

## 2015-08-28 ENCOUNTER — Emergency Department (HOSPITAL_COMMUNITY): Payer: Medicare Other

## 2015-08-28 ENCOUNTER — Inpatient Hospital Stay (HOSPITAL_COMMUNITY)
Admission: EM | Admit: 2015-08-28 | Discharge: 2015-09-02 | DRG: 812 | Disposition: A | Discharge status: Home / Self Care | Payer: Medicare Other | Attending: Internal Medicine | Admitting: Internal Medicine

## 2015-08-28 ENCOUNTER — Encounter (HOSPITAL_COMMUNITY): Payer: Self-pay

## 2015-08-28 DIAGNOSIS — Z832 Family history of diseases of the blood and blood-forming organs and certain disorders involving the immune mechanism: Secondary | ICD-10-CM

## 2015-08-28 DIAGNOSIS — Z86718 Personal history of other venous thrombosis and embolism: Secondary | ICD-10-CM

## 2015-08-28 DIAGNOSIS — Z87891 Personal history of nicotine dependence: Secondary | ICD-10-CM

## 2015-08-28 DIAGNOSIS — D571 Sickle-cell disease without crisis: Secondary | ICD-10-CM | POA: Diagnosis not present

## 2015-08-28 DIAGNOSIS — R0902 Hypoxemia: Secondary | ICD-10-CM | POA: Diagnosis not present

## 2015-08-28 DIAGNOSIS — T8089XA Other complications following infusion, transfusion and therapeutic injection, initial encounter: Secondary | ICD-10-CM | POA: Diagnosis not present

## 2015-08-28 DIAGNOSIS — D5700 Hb-SS disease with crisis, unspecified: Secondary | ICD-10-CM | POA: Diagnosis not present

## 2015-08-28 DIAGNOSIS — Z9049 Acquired absence of other specified parts of digestive tract: Secondary | ICD-10-CM | POA: Diagnosis not present

## 2015-08-28 DIAGNOSIS — Z86711 Personal history of pulmonary embolism: Secondary | ICD-10-CM | POA: Diagnosis not present

## 2015-08-28 DIAGNOSIS — I272 Other secondary pulmonary hypertension: Secondary | ICD-10-CM | POA: Diagnosis not present

## 2015-08-28 DIAGNOSIS — R0602 Shortness of breath: Secondary | ICD-10-CM | POA: Diagnosis not present

## 2015-08-28 DIAGNOSIS — R079 Chest pain, unspecified: Secondary | ICD-10-CM | POA: Diagnosis not present

## 2015-08-28 DIAGNOSIS — I2781 Cor pulmonale (chronic): Secondary | ICD-10-CM | POA: Diagnosis present

## 2015-08-28 DIAGNOSIS — Z7901 Long term (current) use of anticoagulants: Secondary | ICD-10-CM

## 2015-08-28 DIAGNOSIS — D598 Other acquired hemolytic anemias: Secondary | ICD-10-CM | POA: Diagnosis not present

## 2015-08-28 DIAGNOSIS — R809 Proteinuria, unspecified: Secondary | ICD-10-CM | POA: Diagnosis present

## 2015-08-28 DIAGNOSIS — J811 Chronic pulmonary edema: Secondary | ICD-10-CM | POA: Diagnosis present

## 2015-08-28 DIAGNOSIS — Z833 Family history of diabetes mellitus: Secondary | ICD-10-CM

## 2015-08-28 DIAGNOSIS — D599 Acquired hemolytic anemia, unspecified: Secondary | ICD-10-CM | POA: Diagnosis not present

## 2015-08-28 DIAGNOSIS — D72829 Elevated white blood cell count, unspecified: Secondary | ICD-10-CM | POA: Diagnosis present

## 2015-08-28 DIAGNOSIS — J810 Acute pulmonary edema: Secondary | ICD-10-CM | POA: Diagnosis not present

## 2015-08-28 DIAGNOSIS — Z96641 Presence of right artificial hip joint: Secondary | ICD-10-CM | POA: Diagnosis present

## 2015-08-28 DIAGNOSIS — D638 Anemia in other chronic diseases classified elsewhere: Secondary | ICD-10-CM | POA: Diagnosis not present

## 2015-08-28 DIAGNOSIS — J9811 Atelectasis: Secondary | ICD-10-CM | POA: Diagnosis not present

## 2015-08-28 LAB — RETICULOCYTES
RBC.: 2.23 MIL/uL — ABNORMAL LOW (ref 4.22–5.81)
Retic Count, Absolute: 98.1 10*3/uL (ref 19.0–186.0)
Retic Ct Pct: 4.4 % — ABNORMAL HIGH (ref 0.4–3.1)

## 2015-08-28 LAB — COMPREHENSIVE METABOLIC PANEL
ALBUMIN: 4.6 g/dL (ref 3.5–5.0)
ALT: 23 U/L (ref 17–63)
AST: 42 U/L — AB (ref 15–41)
Alkaline Phosphatase: 94 U/L (ref 38–126)
Anion gap: 6 (ref 5–15)
BILIRUBIN TOTAL: 3.5 mg/dL — AB (ref 0.3–1.2)
BUN: 22 mg/dL — AB (ref 6–20)
CALCIUM: 8.8 mg/dL — AB (ref 8.9–10.3)
CO2: 22 mmol/L (ref 22–32)
CREATININE: 0.98 mg/dL (ref 0.61–1.24)
Chloride: 109 mmol/L (ref 101–111)
GFR calc Af Amer: >60 mL/min (ref >60)
GLUCOSE: 116 mg/dL — AB (ref 65–99)
Potassium: 4.6 mmol/L (ref 3.5–5.1)
Sodium: 137 mmol/L (ref 135–145)
Total Protein: 7.6 g/dL (ref 6.5–8.1)

## 2015-08-28 LAB — CBC WITH DIFFERENTIAL/PLATELET
BASOS PCT: 1 %
Basophils Absolute: 0.1 10*3/uL (ref 0.0–0.1)
Eosinophils Absolute: 0.5 10*3/uL (ref 0.0–0.7)
Eosinophils Relative: 4 %
HEMATOCRIT: 21.2 % — AB (ref 39.0–52.0)
Hemoglobin: 7 g/dL — ABNORMAL LOW (ref 13.0–17.0)
LYMPHS ABS: 1.9 10*3/uL (ref 0.7–4.0)
Lymphocytes Relative: 15 %
MCH: 31.4 pg (ref 26.0–34.0)
MCHC: 33 g/dL (ref 30.0–36.0)
MCV: 95.1 fL (ref 78.0–100.0)
MONO ABS: 1.3 10*3/uL — AB (ref 0.1–1.0)
Monocytes Relative: 10 %
NEUTROS ABS: 8.8 10*3/uL — AB (ref 1.7–7.7)
NRBC: 1 /100{WBCs} — AB
Neutrophils Relative %: 70 %
Platelets: 492 10*3/uL — ABNORMAL HIGH (ref 150–400)
RBC: 2.23 MIL/uL — ABNORMAL LOW (ref 4.22–5.81)
RDW: 21.2 % — AB (ref 11.5–15.5)
WBC: 12.6 10*3/uL — ABNORMAL HIGH (ref 4.0–10.5)

## 2015-08-28 LAB — PREPARE RBC (CROSSMATCH)

## 2015-08-28 MED ORDER — DIPHENHYDRAMINE HCL 25 MG PO CAPS
25.0000 mg | ORAL_CAPSULE | ORAL | Status: DC | PRN
  Administered 2015-08-28 – 2015-09-01 (×4): 50 mg via ORAL
  Filled 2015-08-28 (×4): qty 2

## 2015-08-28 MED ORDER — RIVAROXABAN 20 MG PO TABS
20.0000 mg | ORAL_TABLET | Freq: Every day | ORAL | Status: DC
  Administered 2015-08-29 – 2015-09-01 (×4): 20 mg via ORAL
  Filled 2015-08-28 (×4): qty 1

## 2015-08-28 MED ORDER — AMBRISENTAN 5 MG PO TABS
5.0000 mg | ORAL_TABLET | Freq: Every day | ORAL | Status: DC
  Administered 2015-08-29 – 2015-09-01 (×4): 5 mg via ORAL

## 2015-08-28 MED ORDER — ZOLPIDEM TARTRATE 10 MG PO TABS
10.0000 mg | ORAL_TABLET | Freq: Every evening | ORAL | Status: DC | PRN
  Administered 2015-08-28 – 2015-09-01 (×5): 10 mg via ORAL
  Filled 2015-08-28 (×5): qty 1

## 2015-08-28 MED ORDER — DIPHENHYDRAMINE HCL 25 MG PO CAPS
50.0000 mg | ORAL_CAPSULE | Freq: Once | ORAL | Status: AC
  Administered 2015-08-28: 50 mg via ORAL
  Filled 2015-08-28: qty 2

## 2015-08-28 MED ORDER — FOLIC ACID 1 MG PO TABS
1.0000 mg | ORAL_TABLET | Freq: Every morning | ORAL | Status: DC
  Administered 2015-08-29 – 2015-09-02 (×5): 1 mg via ORAL
  Filled 2015-08-28 (×5): qty 1

## 2015-08-28 MED ORDER — HYDROXYUREA 500 MG PO CAPS
500.0000 mg | ORAL_CAPSULE | Freq: Three times a day (TID) | ORAL | Status: DC
  Administered 2015-08-28 – 2015-08-31 (×11): 500 mg via ORAL
  Filled 2015-08-28 (×11): qty 1

## 2015-08-28 MED ORDER — ASPIRIN 81 MG PO CHEW
81.0000 mg | CHEWABLE_TABLET | Freq: Every day | ORAL | Status: DC
  Administered 2015-08-29 – 2015-09-02 (×5): 81 mg via ORAL
  Filled 2015-08-28 (×5): qty 1

## 2015-08-28 MED ORDER — HYDROMORPHONE HCL 2 MG/ML IJ SOLN
4.0000 mg | Freq: Once | INTRAMUSCULAR | Status: AC
  Administered 2015-08-28: 4 mg via INTRAVENOUS
  Filled 2015-08-28: qty 2

## 2015-08-28 MED ORDER — HYDROMORPHONE HCL 2 MG/ML IJ SOLN
4.0000 mg | INTRAMUSCULAR | Status: DC | PRN
  Administered 2015-08-28: 4 mg via INTRAVENOUS
  Filled 2015-08-28: qty 2

## 2015-08-28 MED ORDER — HYDROMORPHONE HCL 2 MG/ML IJ SOLN
2.0000 mg | INTRAMUSCULAR | Status: AC
  Administered 2015-08-28 – 2015-08-29 (×8): 2 mg via INTRAVENOUS
  Filled 2015-08-28 (×8): qty 1

## 2015-08-28 MED ORDER — SODIUM CHLORIDE 0.9% FLUSH: 9.0000 mL | INTRAVENOUS | Status: DC | PRN

## 2015-08-28 MED ORDER — HYDROMORPHONE HCL 2 MG/ML IJ SOLN
3.5000 mg | Freq: Once | INTRAMUSCULAR | Status: AC
  Administered 2015-08-28: 3.5 mg via INTRAVENOUS
  Filled 2015-08-28: qty 2

## 2015-08-28 MED ORDER — SODIUM CHLORIDE 0.9 % IV SOLN
25.0000 mg | INTRAVENOUS | Status: DC | PRN
  Filled 2015-08-28: qty 0.5

## 2015-08-28 MED ORDER — HYDROMORPHONE HCL 2 MG/ML IJ SOLN
3.0000 mg | Freq: Once | INTRAMUSCULAR | Status: AC
  Administered 2015-08-28: 3 mg via INTRAVENOUS
  Filled 2015-08-28: qty 2

## 2015-08-28 MED ORDER — DEFERASIROX 360 MG PO TABS
1080.0000 mg | ORAL_TABLET | Freq: Every day | ORAL | Status: DC
  Administered 2015-08-30 – 2015-09-02 (×4): 1080 mg via ORAL

## 2015-08-28 MED ORDER — HYDROMORPHONE HCL 2 MG/ML IJ SOLN
2.0000 mg | INTRAMUSCULAR | Status: DC | PRN
  Administered 2015-08-29 – 2015-08-31 (×17): 2 mg via INTRAVENOUS
  Filled 2015-08-28 (×17): qty 1

## 2015-08-28 MED ORDER — VITAMIN D 1000 UNITS PO TABS
2000.0000 [IU] | ORAL_TABLET | Freq: Every day | ORAL | Status: DC
  Administered 2015-08-29 – 2015-09-02 (×5): 2000 [IU] via ORAL
  Filled 2015-08-28 (×5): qty 2

## 2015-08-28 MED ORDER — HYDROMORPHONE HCL 2 MG/ML IJ SOLN: 0.0375 mg/kg | INTRAMUSCULAR | Status: DC

## 2015-08-28 MED ORDER — ONDANSETRON HCL 4 MG/2ML IJ SOLN
4.0000 mg | Freq: Four times a day (QID) | INTRAMUSCULAR | Status: DC | PRN
  Administered 2015-08-28: 4 mg via INTRAVENOUS
  Filled 2015-08-28: qty 2

## 2015-08-28 MED ORDER — GABAPENTIN 300 MG PO CAPS
300.0000 mg | ORAL_CAPSULE | Freq: Three times a day (TID) | ORAL | Status: DC
  Administered 2015-08-28 – 2015-09-02 (×15): 300 mg via ORAL
  Filled 2015-08-28 (×17): qty 1

## 2015-08-28 MED ORDER — SODIUM CHLORIDE 0.9 % IV SOLN: Freq: Once | INTRAVENOUS | Status: DC

## 2015-08-28 MED ORDER — SODIUM CHLORIDE 0.9 % IV SOLN
Freq: Once | INTRAVENOUS | Status: AC
  Administered 2015-08-28: 10:00:00 via INTRAVENOUS

## 2015-08-28 MED ORDER — LISINOPRIL 10 MG PO TABS
5.0000 mg | ORAL_TABLET | Freq: Every day | ORAL | Status: DC
  Administered 2015-08-29 – 2015-09-02 (×5): 5 mg via ORAL
  Filled 2015-08-28 (×5): qty 1

## 2015-08-28 MED ORDER — SENNOSIDES-DOCUSATE SODIUM 8.6-50 MG PO TABS
1.0000 | ORAL_TABLET | Freq: Two times a day (BID) | ORAL | Status: DC
  Administered 2015-08-28 – 2015-09-02 (×10): 1 via ORAL
  Filled 2015-08-28 (×10): qty 1

## 2015-08-28 MED ORDER — POLYETHYLENE GLYCOL 3350 17 G PO PACK: 17.0000 g | PACK | Freq: Every day | ORAL | Status: DC | PRN

## 2015-08-28 MED ORDER — HYDROMORPHONE 1 MG/ML IV SOLN
INTRAVENOUS | Status: DC
  Administered 2015-08-28: 13:00:00 via INTRAVENOUS
  Administered 2015-08-28: 6 mg via INTRAVENOUS
  Administered 2015-08-28: 4.8 mg via INTRAVENOUS
  Administered 2015-08-29: 5.4 mg via INTRAVENOUS
  Administered 2015-08-29: 23:00:00 via INTRAVENOUS
  Administered 2015-08-29: 2.4 mg via INTRAVENOUS
  Administered 2015-08-29: 7.8 mg via INTRAVENOUS
  Administered 2015-08-29: 4.2 mg via INTRAVENOUS
  Administered 2015-08-29: 3.39 mg via INTRAVENOUS
  Administered 2015-08-29: 6.6 mg via INTRAVENOUS
  Administered 2015-08-29: 7.8 mg via INTRAVENOUS
  Administered 2015-08-29: 08:00:00 via INTRAVENOUS
  Administered 2015-08-30: 1.8 mg via INTRAVENOUS
  Administered 2015-08-30: 6 mg via INTRAVENOUS
  Administered 2015-08-30: 5.4 mg via INTRAVENOUS
  Administered 2015-08-30: 8.4 mg via INTRAVENOUS
  Administered 2015-08-30: 7.7 mg via INTRAVENOUS
  Administered 2015-08-30: 18:00:00 via INTRAVENOUS
  Administered 2015-08-31 (×2): 9 mg via INTRAVENOUS
  Administered 2015-08-31: 08:00:00 via INTRAVENOUS
  Administered 2015-08-31: 6 mg via INTRAVENOUS
  Administered 2015-08-31: 0.6 mg via INTRAVENOUS
  Administered 2015-08-31: 7.2 mg via INTRAVENOUS
  Administered 2015-08-31: 9.6 mg via INTRAVENOUS
  Administered 2015-08-31: 6.6 mg via INTRAVENOUS
  Administered 2015-09-01: 1.8 mg via INTRAVENOUS
  Administered 2015-09-01: 10.8 mg via INTRAVENOUS
  Administered 2015-09-01: 3.6 mg via INTRAVENOUS
  Filled 2015-08-28 (×7): qty 25

## 2015-08-28 MED ORDER — KETOROLAC TROMETHAMINE 30 MG/ML IJ SOLN
30.0000 mg | INTRAMUSCULAR | Status: AC
  Administered 2015-08-28: 30 mg via INTRAVENOUS
  Filled 2015-08-28: qty 1

## 2015-08-28 MED ORDER — MORPHINE SULFATE ER 30 MG PO TBCR
30.0000 mg | EXTENDED_RELEASE_TABLET | Freq: Two times a day (BID) | ORAL | Status: DC
  Administered 2015-08-28 – 2015-09-02 (×10): 30 mg via ORAL
  Filled 2015-08-28 (×10): qty 1

## 2015-08-28 MED ORDER — DEXTROSE-NACL 5-0.45 % IV SOLN
INTRAVENOUS | Status: DC
  Administered 2015-08-28: 100 mL/h via INTRAVENOUS

## 2015-08-28 MED ORDER — CETYLPYRIDINIUM CHLORIDE 0.05 % MT LIQD
7.0000 mL | Freq: Two times a day (BID) | OROMUCOSAL | Status: DC
  Administered 2015-08-28 – 2015-09-01 (×9): 7 mL via OROMUCOSAL

## 2015-08-28 MED ORDER — NALOXONE HCL 0.4 MG/ML IJ SOLN: 0.4000 mg | INTRAMUSCULAR | Status: DC | PRN

## 2015-08-28 MED ORDER — KETOROLAC TROMETHAMINE 30 MG/ML IJ SOLN
30.0000 mg | Freq: Four times a day (QID) | INTRAMUSCULAR | Status: AC
  Administered 2015-08-28 – 2015-09-02 (×20): 30 mg via INTRAVENOUS
  Filled 2015-08-28 (×19): qty 1

## 2015-08-28 NOTE — ED Notes (Addendum)
Pt placed on 2L [place]

## 2015-08-28 NOTE — ED Notes (Signed)
Pt reports bila rib cage pain due to sickle cell crisis.  Pt is A&Ox 4.  In NAD.

## 2015-08-28 NOTE — ED Provider Notes (Signed)
[facility] DEPT Provider Note   CSN: [id number] Arrival date & time: 08/28/15  [id number]  First Provider Contact:  First MD Initiated Contact with Patient 08/28/15 [phone]        History   Chief Complaint Chief Complaint  Patient presents with  . Sickle Cell Pain Crisis  . Ribcage pain  . Back Pain    HPI [name] is a 35 y.o. male presenting with a sickle cell pain crisis. Has been having bilateral lower rib pain and low back pain for a couple days. Worse this am. Has been taking his hydromorphone po and MS contin as prescribed. Pain was severe enough this AM he had to come to ER instead of being able to go to the sickle cell clinic. Denies dyspnea. This pain is similar to multiple prior exacerbations. Denies cough or fever. Pain 8/10. Requests benadryl for itching and asks if I will call sickle cell clinic when it opens to get him up there. O2 Sat 89% on room air on arrival. Wears O2 at night but not in day.  HPI  Past Medical History:  Diagnosis Date  . Acute chest syndrome ([place]) 06/18/2013  . Acute embolism and thrombosis of right internal jugular vein ([place])   . Alcohol consumption of one to four drinks per day   . Avascular necrosis (HCC)    Right Hip  . Blood transfusion   . Chronic anticoagulation   . Demand ischemia ([place]) 01/02/2014  . Former smoker   . Functional asplenia   . Hb-SS disease with crisis ([place])   . History of Clostridium difficile infection   . History of pulmonary embolus (PE)   . Hypertension   . Hypokalemia   . Leukocytosis    Chronic  . Mood disorder ([place])   . Noncompliance with medication regimen   . Oxygen deficiency   . Pulmonary hypertension ([place])   . Second hand tobacco smoke exposure   . Sickle cell anemia (HCC)   . Sickle-cell crisis with associated acute chest syndrome ([place]) 05/13/2013  . Thrombocytosis (HCC)    Chronic  . Uses marijuana     Patient Active Problem List   Diagnosis Date Noted  . Shortness of breath   .  Anemia requiring transfusions 07/17/2015  . Pulmonary embolism without acute cor pulmonale ([place])   . Acute upper respiratory infection 06/09/2015  . Cough   . Fever   . Sickle cell anemia with pain ([place]) 05/07/2015  . Sickle cell anemia with crisis ([place]) 04/26/2015  . AKI (acute kidney injury) ([name])   . Hyperkalemia   . Hypoxia   . Leucocytosis   . Sickle-cell disease with pain ([place]) 04/13/2015  . Sickle cell pain crisis ([place]) 03/30/2015  . Leukocytosis 02/25/2015  . On home oxygen therapy 02/23/2015  . Anemia 01/31/2015  . Symptomatic anemia   . Syncope 11/30/2014  . Hb-SS disease without crisis ([place]) 10/07/2014  . Acute respiratory failure with hypoxia ([place])   . Acute chest syndrome in sickle crisis ([place])   . PVC's (premature ventricular contractions) 09/10/2014  . Abnormal EKG 09/10/2014  . Hypoxemia   . Chronic atrial fibrillation ([place]) 09/09/2014  . Sickle cell crisis ([place]) 09/08/2014  . Bacteremia   . Elevated troponin I level 08/29/2014  . Cor pulmonale, chronic ([place]) 08/25/2014  . Ankle edema 07/07/2014  . Sickle cell anemia ([place]) 06/25/2014  . Anemia of chronic disease 06/25/2014  . PAH (pulmonary artery hypertension) ([place]) 03/18/2014  . Chronic respiratory failure with hypoxia (  [place]) 03/14/2014  . Hb-SS disease with crisis ([place]) 01/22/2014  . Paralytic strabismus, external ophthalmoplegia   . Chronic pain syndrome 12/12/2013  . Chronic anticoagulation 08/22/2013  . Essential hypertension 08/22/2013  . Pulmonary HTN ([place]) 06/18/2013  . Functional asplenia   . Vitamin D deficiency 02/13/2013  . Embolism, pulmonary with infarction ([place]) 07/09/2012  . Hx of pulmonary embolus 06/29/2012  . Hemochromatosis 12/14/2011  . Avascular necrosis [facility])     Past Surgical History:  Procedure Laterality Date  . CHOLECYSTECTOMY     01/2008  . Excision of left periauricular cyst     10/2009  . Excision of right ear lobe cyst with primary closur     11/2007  . Porta cath  placement    . Porta cath removal    . PORTACATH PLACEMENT  01/05/2012   Procedure: INSERTION PORT-A-CATH;  Surgeon: Odis Hollingshead, MD;  Location: [facility];  Service: General;  Laterality: N/A;  ultrasound guiced port a cath insertion with fluoroscopy  . Right hip replacement     08/2006  . UMBILICAL HERNIA REPAIR     01/2008       Home Medications    Prior to Admission medications   Medication Sig Start Date End Date Taking? Authorizing Provider  ambrisentan (LETAIRIS) 5 MG tablet Take 5 mg by mouth at bedtime.    Yes Historical Provider, MD  aspirin 81 MG chewable tablet Chew 1 tablet (81 mg total) by mouth daily. 07/23/14  Yes [name], MD  Cholecalciferol (VITAMIN D) 2000 units tablet Take 1 tablet (2,000 Units total) by mouth daily. 02/23/15  Yes Dorena Dew, FNP  Deferasirox (JADENU) 360 MG TABS Take 1,080 mg by mouth daily.    Yes Historical Provider, MD  folic acid (FOLVITE) 1 MG tablet Take 1 tablet (1 mg total) by mouth every morning. 02/23/15  Yes Dorena Dew, FNP  gabapentin (NEURONTIN) 300 MG capsule Take 1 capsule (300 mg total) by mouth 3 (three) times daily. 06/01/15  Yes Tresa Garter, MD  HYDROmorphone (DILAUDID) 4 MG tablet Take 1 tablet (4 mg total) by mouth every 4 (four) hours as needed for severe pain. 08/07/15  Yes Tresa Garter, MD  hydroxyurea (HYDREA) 500 MG capsule Take 2 capsules (1,000 mg total) by mouth daily. May take with food to minimize GI side effects. Patient taking differently: Take 500 mg by mouth 3 (three) times daily. May take with food to minimize GI side effects. 06/01/15  Yes Tresa Garter, MD  lisinopril (PRINIVIL,ZESTRIL) 5 MG tablet Take 1 tablet (5 mg total) by mouth daily. 08/04/15  Yes [name], MD  morphine (MS CONTIN) 30 MG 12 hr tablet Take 1 tablet (30 mg total) by mouth every 12 (twelve) hours. 08/07/15  Yes Tresa Garter, MD  rivaroxaban (XARELTO) 20 MG TABS tablet Take 20 mg by mouth daily  with supper.   Yes Historical Provider, MD  torsemide (DEMADEX) 20 MG tablet Take 20 mg by mouth daily as needed. For increase in patient weight greater than 3 lbs. 07/27/15 07/26/16 Yes Historical Provider, MD  zolpidem (AMBIEN) 10 MG tablet Take 1 tablet (10 mg total) by mouth at bedtime as needed for sleep. 08/07/15  Yes Tresa Garter, MD  potassium chloride SA (K-DUR,KLOR-CON) 20 MEQ tablet Take 1 tablet (20 mEq total) by mouth every morning. Patient not taking: Reported on 08/28/2015 04/13/15   Dorena Dew, FNP  tadalafil (CIALIS) 10 MG tablet Take 1  tablet (10 mg total) by mouth daily as needed for erectile dysfunction. Patient not taking: Reported on 08/28/2015 08/07/15   Tresa Garter, MD    Family History Family History  Problem Relation Age of Onset  . Sickle cell trait Mother   . Depression Mother   . Diabetes Mother   . Sickle cell trait Father   . Sickle cell trait Brother     Social History Social History  Substance Use Topics  . Smoking status: Former Smoker    Packs/day: 0.50    Years: 10.00    Types: Cigarettes    Quit date: 05/29/2011  . Smokeless tobacco: Never Used  . Alcohol use No     Allergies   Review of patient's allergies indicates no known allergies.   Review of Systems Review of Systems  Constitutional: Negative for fever.  Respiratory: Negative for cough and shortness of breath.   Cardiovascular: Positive for chest pain.  Gastrointestinal: Negative for abdominal pain and vomiting.  Musculoskeletal: Positive for back pain.  Neurological: Negative for weakness.  All other systems reviewed and are negative.    Physical Exam Updated Vital Signs BP 126/83 (BP Location: Left Arm)   Pulse 77   Temp 98.6 F (37 C) (Oral)   Resp 20   Ht 6' (1.829 m)   Wt 168 lb (76.2 kg)   SpO2 93%   BMI 22.78 kg/m   Physical Exam  Constitutional: He is oriented to person, place, and time. He appears well-developed and well-nourished.  Non-toxic  appearance. He does not have a sickly appearance. He does not appear ill. No distress.  HENT:  Head: Normocephalic and atraumatic.  Right Ear: External ear normal.  Left Ear: External ear normal.  Nose: Nose normal.  Eyes: Right eye exhibits no discharge. Left eye exhibits no discharge.  Neck: Neck supple.  Cardiovascular: Normal rate, regular rhythm, normal heart sounds and intact distal pulses.   Pulmonary/Chest: Effort normal and breath sounds normal. He has no wheezes. He has no rales. He exhibits no tenderness.  Abdominal: Soft. He exhibits no distension. There is no tenderness.  Musculoskeletal: He exhibits no edema.       Thoracic back: He exhibits no tenderness.       Lumbar back: He exhibits no tenderness.  Neurological: He is alert and oriented to person, place, and time.  Skin: Skin is warm and dry. He is not diaphoretic.  Nursing note and vitals reviewed.    ED Treatments / Results  Labs (all labs ordered are listed, but only abnormal results are displayed) Labs Reviewed  COMPREHENSIVE METABOLIC PANEL - Abnormal; Notable for the following:       Result Value   Glucose, Bld 116 (*)    BUN 22 (*)    Calcium 8.8 (*)    AST 42 (*)    Total Bilirubin 3.5 (*)    All other components within normal limits  CBC WITH DIFFERENTIAL/PLATELET - Abnormal; Notable for the following:    WBC 12.6 (*)    RBC 2.23 (*)    Hemoglobin 7.0 (*)    HCT 21.2 (*)    RDW 21.2 (*)    Platelets 492 (*)    nRBC 1 (*)    Neutro Abs 8.8 (*)    Monocytes Absolute 1.3 (*)    All other components within normal limits  RETICULOCYTES - Abnormal; Notable for the following:    Retic Ct Pct 4.4 (*)    RBC. 2.23 (*)  All other components within normal limits    EKG  EKG Interpretation None       Radiology Dg Chest 2 View  Result Date: 08/28/2015 CLINICAL DATA:  Sickle cell crisis, chest pain, shortness of breath EXAM: CHEST  2 VIEW COMPARISON:  PA and lateral chest x-ray of August 07, 2015  FINDINGS: The lungs are well-expanded. The interstitial markings are increased. Confluent alveolar opacity is present in the left upper lobe. The cardiac silhouette is chronically enlarged. The pulmonary vascularity is chronically engorged centrally. There is no pleural effusion. There is mild multilevel degenerative disc disease of the thoracic spine. The Port-A-Cath appliance catheter tip projects over the distal third of the SVC. IMPRESSION: Chronic cardiomegaly and central pulmonary vascular congestion. Patchy alveolar opacity in the left upper leg and may reflect pneumonia or confluent edema. Electronically Signed   By: David  Martinique M.D.   On: 08/28/2015 08:34   Procedures Procedures (including critical care time)  Medications Ordered in ED Medications  HYDROmorphone (DILAUDID) injection 4 mg (not administered)  ketorolac (TORADOL) 30 MG/ML injection 30 mg (30 mg Intravenous Given 08/28/15 0814)  HYDROmorphone (DILAUDID) injection 3 mg (3 mg Intravenous Given 08/28/15 0814)    Followed by  HYDROmorphone (DILAUDID) injection 3.5 mg (3.5 mg Intravenous Given 08/28/15 0848)    Followed by  HYDROmorphone (DILAUDID) injection 4 mg (4 mg Intravenous Given 08/28/15 1008)  diphenhydrAMINE (BENADRYL) capsule 50 mg (50 mg Oral Given 08/28/15 0813)  0.9 %  sodium chloride infusion ( Intravenous New Bag/Given 08/28/15 1011)     Initial Impression / Assessment and Plan / ED Course  I have reviewed the triage vital signs and the nursing notes.  Pertinent labs & imaging results that were available during my care of the patient were reviewed by me and considered in my medical decision making (see chart for details).  Clinical Course  Comment By Time  Will give dilaudid, toradol, and supportive O2 given initial sat of 89%. He feels this is a typical crisis, but will check labs and CXR based on O2.  Sherwood Gambler, MD 07/28 [phone]  D/w Dr. Zigmund Daniel. Patient is supposed to be on O2 at 3L all the time (pulm  HTN) and thus he is not newly hypoxic. Thus she states nothing to do about this CXR finding given he is at his baseline sickle cell crisis pain and would not have acute chest. Recommends sending to sickle cell clinic for pain management today. Sherwood Gambler, MD 07/28 1023  D/w Dr. Doreene Burke who recommends admission rather than sending to [facility] clinic. Dr. Zigmund Daniel to admit, medsurg inpatient. Sherwood Gambler, MD 07/28 1045     Final Clinical Impressions(s) / ED Diagnoses   Final diagnoses:  Sickle cell pain crisis [facility])    New Prescriptions New Prescriptions   No medications on file     Sherwood Gambler, MD 08/28/15 1102

## 2015-08-28 NOTE — H&P (Signed)
Hospital Admission Note Date: 08/28/2015  Patient name: [name] Medical record number: [id number] Date of birth: February 23, 1980 Age: 35 y.o. Gender: male PCP: [name], MD  Attending physician: [name], MD  Chief Complaint: Pain in ribs x 2 days  History of Present Illness: [name] is well known to me as an opiate tolerant patient with Hb ss who receives serial  RBC pheresis and also has pulmonary Hypertension. He had been on Oxygen around the clock and was recently taken off of oxygen ATC since his six minute walk test was negative for hypoxemia. He presented to the ED today with c/o pain and was found to be hypoxic with oxygen saturations on RA at 85%. He was also recently started on Ambrisentan for treatment of pulmonary Hypertension. Due to his hypoxemia a CXR was performed which showed an opacity on the LUL.. I spoke with his girlfriend who is a Marine scientist and she reports that he appears to start becoming Hypoxic at about the 3rd week after Pheresis. A review of his labs from [place] show that his last electrophoresis showed a post procedure value of Hb S 24% and Hb A 68 %. He denies any fevers, chills, cough, SOB or DOE.   In the ED he received 3 doses of Dilaudid and pain was still uncontrolled and I am asked to admit him for Hypoxia and Hb SS with crisis.    Scheduled Meds:  Continuous Infusions:  PRN Meds:.HYDROmorphone (DILAUDID) injection Allergies: Review of patient's allergies indicates no known allergies. Past Medical History:  Diagnosis Date  . Acute chest syndrome ([place]) 06/18/2013  . Acute embolism and thrombosis of right internal jugular vein ([place])   . Alcohol consumption of one to four drinks per day   . Avascular necrosis (HCC)    Right Hip  . Blood transfusion   . Chronic anticoagulation   . Demand ischemia ([place]) 01/02/2014  . Former smoker   . Functional asplenia   . Hb-SS disease with crisis ([place])   . History of Clostridium difficile infection    . History of pulmonary embolus (PE)   . Hypertension   . Hypokalemia   . Leukocytosis    Chronic  . Mood disorder ([place])   . Noncompliance with medication regimen   . Oxygen deficiency   . Pulmonary hypertension ([place])   . Second hand tobacco smoke exposure   . Sickle cell anemia (HCC)   . Sickle-cell crisis with associated acute chest syndrome ([place]) 05/13/2013  . Thrombocytosis (HCC)    Chronic  . Uses marijuana    Past Surgical History:  Procedure Laterality Date  . CHOLECYSTECTOMY     01/2008  . Excision of left periauricular cyst     10/2009  . Excision of right ear lobe cyst with primary closur     11/2007  . Porta cath placement    . Porta cath removal    . PORTACATH PLACEMENT  01/05/2012   Procedure: INSERTION PORT-A-CATH;  Surgeon: Odis Hollingshead, MD;  Location: [facility] [facility];  Service: General;  Laterality: N/A;  ultrasound guiced port a cath insertion with fluoroscopy  . Right hip replacement     08/2006  . UMBILICAL HERNIA REPAIR     01/2008   Family History  Problem Relation Age of Onset  . Sickle cell trait Mother   . Depression Mother   . Diabetes Mother   . Sickle cell trait Father   . Sickle cell trait Brother    Social History  Social History  . Marital status: Single    Spouse name: N/A  . Number of children: 0  . Years of education: 68   Occupational History  . 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 Smoking status: Former Smoker    Packs/day: 0.50    Years: 10.00    Types: Cigarettes    Quit date: 05/29/2011  . Smokeless tobacco: Never Used  . Alcohol use No  . Drug use: No     Comment: Marijuana weekly  . Sexual activity: Yes    Partners: Female     Comment: month ago   Other Topics Concern  . Not on file   Social History Narrative   Lives in an apartment.  Single.  Lives alone but has a girlfriend that helps care for him.  Does not use any assist devices.        [name]  [phone] Mom, emergency contact      [facility]:   Patient continuing to live in her apartment in town alone. 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   Review of Systems: Pertinent items noted in HPI and remainder of comprehensive ROS otherwise negative. Physical Exam: No intake or output data in the 24 hours ending 08/28/15 1215 General: Alert, awake, oriented x3, in no acute distress.  HEENT: [employer]/AT PEERL, EOMI Neck: Trachea midline,  no masses, no thyromegal,y no JVD, no carotid bruit OROPHARYNX:  Moist, No exudate/ erythema/lesions.  Heart: Regular rate and rhythm, without murmurs, rubs, gallops, PMI non-displaced, no heaves or thrills on palpation.  Lungs: Clear to auscultation, no wheezing or rhonchi noted. No increased vocal fremitus resonant to percussion  Abdomen: Soft, nontender, nondistended, positive bowel sounds, no masses no hepatosplenomegaly noted..  Neuro: No focal neurological deficits noted cranial nerves II through XII grossly intact.  Strength at functional baseline in bilateral upper and lower extremities. Musculoskeletal: No warm swelling or erythema around joints, no spinal tenderness noted. Psychiatric: Patient alert and oriented x3, good insight and cognition, good recent to remote recall.   Lab results:  Recent Labs  08/28/15 0854  NA 137  K 4.6  CL 109  CO2 22  GLUCOSE 116*  BUN 22*  CREATININE 0.98  CALCIUM 8.8*    Recent Labs  08/28/15 0854  AST 42*  ALT 23  ALKPHOS 94  BILITOT 3.5*  PROT 7.6  ALBUMIN 4.6   No results for input(s): LIPASE, AMYLASE in the last 72 hours.  Recent Labs  08/28/15 0854  WBC 12.6*  NEUTROABS 8.8*  HGB 7.0*  HCT 21.2*  MCV 95.1  PLT 492*   No results for input(s): CKTOTAL, CKMB, CKMBINDEX, TROPONINI in the last 72 hours. Invalid input(s): POCBNP No results for input(s): DDIMER in the last 72 hours. No results for input(s): HGBA1C in the last 72 hours. No results for input(s): CHOL,  HDL, LDLCALC, TRIG, CHOLHDL, LDLDIRECT in the last 72 hours. No results for input(s): TSH, T4TOTAL, T3FREE, THYROIDAB in the last 72 hours.  Invalid input(s): FREET3  Recent Labs  08/28/15 0854  RETICCTPCT 4.4*   Imaging results:  Dg Chest 2 View  Result Date: 08/28/2015 CLINICAL DATA:  Sickle cell crisis, chest pain, shortness of breath EXAM: CHEST  2 VIEW COMPARISON:  PA and lateral chest x-ray of August 07, 2015 FINDINGS: The lungs are well-expanded. The interstitial markings are increased. Confluent alveolar opacity is present in the  left upper lobe. The cardiac silhouette is chronically enlarged. The pulmonary vascularity is chronically engorged centrally. There is no pleural effusion. There is mild multilevel degenerative disc disease of the thoracic spine. The Port-A-Cath appliance catheter tip projects over the distal third of the SVC. IMPRESSION: Chronic cardiomegaly and central pulmonary vascular congestion. Patchy alveolar opacity in the left upper leg and may reflect pneumonia or confluent edema. Electronically Signed   By: David  Martinique M.D.   On: 08/28/2015 08:34  Dg Chest 2 View  Result Date: 08/07/2015 CLINICAL DATA:  Sickle cell crisis with shortness of breath and cough EXAM: CHEST  2 VIEW COMPARISON:  08/02/2015 FINDINGS: Cardiac shadow remains enlarged. Left chest wall port is again seen and stable. Mild interstitial changes are noted throughout both lungs. No acute bony abnormality is noted. IMPRESSION: Mild increased interstitial changes without focal infiltrate. Electronically Signed   By: Inez Catalina M.D.   On: 08/07/2015 14:13   Dg Chest 2 View  Result Date: 08/02/2015 CLINICAL DATA:  Acute onset of shortness of breath and lower chest pain. Initial encounter. EXAM: CHEST  2 VIEW COMPARISON:  Chest radiograph performed 07/18/2015 FINDINGS: The lungs are well-aerated and clear. There is no evidence of focal opacification, pleural effusion or pneumothorax. The heart is enlarged.  A left-sided chest port is noted ending about the distal SVC. No acute osseous abnormalities are seen. IMPRESSION: Cardiomegaly.  Lungs remain grossly clear. Electronically Signed   By: Garald Balding M.D.   On: 08/02/2015 22:44     Assessment and Plan: 1. Hypoxemia: Will observe patient over the next 24 hours. Also have patient ambulate. I do not feel that he has acute chest at present. However will evaluate over the next 24 hours. Continue Oxygen supplementation.  2. Hb SS with crisis: Will treat with Dilaudid via PCA, Toradol and IVF. Will consider clinician assisted doses of Dilaudid if necessary. 3. Pulmonary Hypertension; Continue Ambrisentan (Pt to bring from home). Pt normally takes Torsemide. However his need for hydration will be counteracted by Torsemide. Will hold Torsemide for now and re-assessment hydration tomorrow for resumption. 4. Secondary Hemochromatosis: Continue Jadenu (Pt to bring from home) 5. 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  Time spent 60 minutes. Greater than 50% f time was spent in face-to-face contact, assessment, counseling and coordination of care.  Layann Bluett A. 08/28/2015, 12:15 PM

## 2015-08-28 NOTE — ED Notes (Signed)
Patient transported to X-ray 

## 2015-08-28 NOTE — ED Notes (Signed)
MD at bedside. Rodena Piety

## 2015-08-28 NOTE — ED Triage Notes (Signed)
Pt c/o bilateral ribcage and mid back pain r/t Sickle Cell Crisis x "a couple days."  Pain score 8/10.  Pt reports taking all medications as prescribed and hydrating.

## 2015-08-28 NOTE — Progress Notes (Signed)
Utilization review completed IN Lynett Grimes RN, BSN, CM

## 2015-08-29 DIAGNOSIS — D638 Anemia in other chronic diseases classified elsewhere: Secondary | ICD-10-CM

## 2015-08-29 LAB — CBC WITH DIFFERENTIAL/PLATELET
BASOS ABS: 0 10*3/uL (ref 0.0–0.1)
Basophils Relative: 0 %
Eosinophils Absolute: 0.6 10*3/uL (ref 0.0–0.7)
Eosinophils Relative: 4 %
HEMATOCRIT: 20.5 % — AB (ref 39.0–52.0)
HEMOGLOBIN: 6.8 g/dL — AB (ref 13.0–17.0)
LYMPHS PCT: 15 %
Lymphs Abs: 2.1 10*3/uL (ref 0.7–4.0)
MCH: 31.5 pg (ref 26.0–34.0)
MCHC: 33.2 g/dL (ref 30.0–36.0)
MCV: 94.9 fL (ref 78.0–100.0)
MONOS PCT: 13 %
Monocytes Absolute: 1.8 10*3/uL — ABNORMAL HIGH (ref 0.1–1.0)
Neutro Abs: 9.6 10*3/uL — ABNORMAL HIGH (ref 1.7–7.7)
Neutrophils Relative %: 68 %
Platelets: 502 10*3/uL — ABNORMAL HIGH (ref 150–400)
RBC: 2.16 MIL/uL — AB (ref 4.22–5.81)
RDW: 21.2 % — ABNORMAL HIGH (ref 11.5–15.5)
WBC: 14.1 10*3/uL — AB (ref 4.0–10.5)

## 2015-08-29 MED ORDER — SODIUM CHLORIDE 0.9 % IV SOLN
Freq: Once | INTRAVENOUS | Status: AC
Start: 2015-08-29 — End: 2015-08-29
  Administered 2015-08-29: 10:00:00 via INTRAVENOUS

## 2015-08-29 MED ORDER — TORSEMIDE 20 MG PO TABS
20.0000 mg | ORAL_TABLET | Freq: Every day | ORAL | Status: DC | PRN
  Administered 2015-08-30 – 2015-09-01 (×3): 20 mg via ORAL
  Filled 2015-08-29 (×6): qty 1

## 2015-08-29 NOTE — Progress Notes (Signed)
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 Hypoxemia: Pt symptomatic from his anemia. I suspect that the increase in Hb S since RBC Pheresis is contributing to hypoxemia. Will transfuse 2 unit RBC's to day. Pt is no longer requiring oxygen at baseline. He does have an infiltrate on CXR which could also be secondary to fluid. His weight has increased from his 79.5 Kg on 7/26 to 81.8 Kg. Will start Torsemide and discontinue IVF.  2. Hb SS with crisis: Will continue PCA, clinician assisted doses and  continue Toradol. Continue home MS Contin. 3. Anemia of chronic disease: Pt has chronic anemia and had a baseline Hb of 7 g/dl. He has been receiving RBC Pheresis therapy at [facility] with last session on 08/11/2015.  At that time his  Post-procedure Hb S was 24%. According to his girlfriend he seem sot start having symptoms of hypoxia and increased pain around the 3rd week post pheresis. In light of the hypoxia, will transfuse 1 unit RBC.  4. Leukocytosis: Patient has chronically elevated white blood cell count. No evidence of infection. Likely related to bone marrow activity.  5. Chronic Anticoagulation: Pt was recently at [facility] and was resumed on Xarelto and Lovenox discontinued. Of note he was previously on Xarelto and failed therapy while on Xarelto.  6.  Pulmonary Hypertension and Cor Pulmonale: He was seen by Dr. Christy Sartorius Test at [facility] and therapy with Letairis (Ambrisentan)  5 mg was initiated. Pt had recent follow up with Dr. Karena Addison on July 26th. Continue Ambisentan. 7. Proteinuria. Will continue Lisinopril as this was prescribed not foro HTN but rather for reno-vascular protective effects.  8. Chronic pain: Continue MS Contin and Gabapentin.   Code Status: Full Code Family Communication:  N/A Disposition Plan: Anticipate discharge home tomorrow  [name] A.  Pager [phone]. If 7PM-7AM, please contact night-coverage.  08/29/2015, 2:24 PM  LOS: 1 day   Interim History: Pt reports pain as 710 and localized to RLQ of chest.  Consultants:  None  Procedures:  None  Antibiotics:  None   Objective: Vitals:   08/29/15 0644 08/29/15 0808 08/29/15 1009 08/29/15 1200  BP: (!) 119/59  138/83   Pulse: 88  75   Resp: 14 12 16 14   Temp: 98.2 F (36.8 C)  98.6 F (37 C)   TempSrc: Oral  Oral   SpO2: 91% 92% 97% 98%  Weight: 81.8 kg (180 lb 5.4 oz)     Height:       Weight change:   Intake/Output Summary (Last 24 hours) at 08/29/15 1424 Last data filed at 08/29/15 1300  Gross per 24 hour  Intake              920 ml  Output             1650 ml  Net             -730 ml    General: Alert, awake, oriented x3, in no apparent distress.  HEENT: [place]/AT PEERL, EOMI, mild icterus Neck: Trachea midline,  no masses, no thyromegal,y no JVD, no carotid bruit OROPHARYNX:  Moist, No exudate/ erythema/lesions.  Heart: Regular rate and rhythm, without murmurs, rubs, gallops, PMI non-displaced, no heaves or thrills on palpation.  Lungs: Clear to auscultation, no wheezing or rhonchi noted. No increased vocal  fremitus resonant to percussion  Abdomen: Soft, non-tender,  nondistended, positive bowel sounds, no masses no hepatosplenomegaly noted. No RUQ tenderness. Neuro: No focal neurological deficits noted cranial nerves II through XII grossly intact.  Strength at functional baseline in bilateral upper and lower extremities. Musculoskeletal: No warmth swelling or erythema around joints, no spinal tenderness noted. Psychiatric: Patient alert and oriented x3, good insight and cognition, good recent to remote recall.    Data Reviewed: Basic Metabolic Panel:  Recent Labs Lab 08/28/15 0854  NA 137  K 4.6  CL 109  CO2 22  GLUCOSE 116*  BUN 22*  CREATININE 0.98   CALCIUM 8.8*   Liver Function Tests:  Recent Labs Lab 08/28/15 0854  AST 42*  ALT 23  ALKPHOS 94  BILITOT 3.5*  PROT 7.6  ALBUMIN 4.6   No results for input(s): LIPASE, AMYLASE in the last 168 hours. No results for input(s): AMMONIA in the last 168 hours. CBC:  Recent Labs Lab 08/28/15 0854 08/29/15 0500  WBC 12.6* 14.1*  NEUTROABS 8.8* 9.6*  HGB 7.0* 6.8*  HCT 21.2* 20.5*  MCV 95.1 94.9  PLT 492* 502*   Cardiac Enzymes: No results for input(s): CKTOTAL, CKMB, CKMBINDEX, TROPONINI in the last 168 hours. BNP (last 3 results)  Recent Labs  09/10/14 0612 11/30/14 0910 08/02/15 2307  BNP 549.5* 639.7* 503.2*    ProBNP (last 3 results) No results for input(s): PROBNP in the last 8760 hours.  CBG: No results for input(s): GLUCAP in the last 168 hours.  No results found for this or any previous visit (from the past 240 hour(s)).   Studies: Dg Chest 2 View  Result Date: 08/28/2015 CLINICAL DATA:  Sickle cell crisis, chest pain, shortness of breath EXAM: CHEST  2 VIEW COMPARISON:  PA and lateral chest x-ray of August 07, 2015 FINDINGS: The lungs are well-expanded. The interstitial markings are increased. Confluent alveolar opacity is present in the left upper lobe. The cardiac silhouette is chronically enlarged. The pulmonary vascularity is chronically engorged centrally. There is no pleural effusion. There is mild multilevel degenerative disc disease of the thoracic spine. The Port-A-Cath appliance catheter tip projects over the distal third of the SVC. IMPRESSION: Chronic cardiomegaly and central pulmonary vascular congestion. Patchy alveolar opacity in the left upper leg and may reflect pneumonia or confluent edema. Electronically Signed   By: David  Martinique M.D.   On: 08/28/2015 08:34  Dg Chest 2 View  Result Date: 08/07/2015 CLINICAL DATA:  Sickle cell crisis with shortness of breath and cough EXAM: CHEST  2 VIEW COMPARISON:  08/02/2015 FINDINGS: Cardiac shadow  remains enlarged. Left chest wall port is again seen and stable. Mild interstitial changes are noted throughout both lungs. No acute bony abnormality is noted. IMPRESSION: Mild increased interstitial changes without focal infiltrate. Electronically Signed   By: Inez Catalina M.D.   On: 08/07/2015 14:13   Dg Chest 2 View  Result Date: 08/02/2015 CLINICAL DATA:  Acute onset of shortness of breath and lower chest pain. Initial encounter. EXAM: CHEST  2 VIEW COMPARISON:  Chest radiograph performed 07/18/2015 FINDINGS: The lungs are well-aerated and clear. There is no evidence of focal opacification, pleural effusion or pneumothorax. The heart is enlarged. A left-sided chest port is noted ending about the distal SVC. No acute osseous abnormalities are seen. IMPRESSION: Cardiomegaly.  Lungs remain grossly clear. Electronically Signed   By: Garald Balding M.D.   On: 08/02/2015 22:44    Scheduled Meds: . sodium chloride   Intravenous  Once  . ambrisentan  5 mg Oral QHS  . antiseptic oral rinse  7 mL Mouth Rinse BID  . aspirin  81 mg Oral Daily  . cholecalciferol  2,000 Units Oral Daily  . Deferasirox  1,080 mg Oral Daily  . folic acid  1 mg Oral q morning - 10a  . gabapentin  300 mg Oral TID  . HYDROmorphone   Intravenous Q4H  . hydroxyurea  500 mg Oral TID  . ketorolac  30 mg Intravenous Q6H  . lisinopril  5 mg Oral Daily  . morphine  30 mg Oral Q12H  . rivaroxaban  20 mg Oral Q supper  . senna-docusate  1 tablet Oral BID   Continuous Infusions: . dextrose 5 % and 0.45% NaCl 10 mL/hr at 08/29/15 0948    In excess of 25 minutes was spent during this visit. Greater than 50% of time was spent in face-to-face contact, assessment, counseling and coordination of care.

## 2015-08-30 LAB — CBC WITH DIFFERENTIAL/PLATELET
BASOS ABS: 0.1 10*3/uL (ref 0.0–0.1)
Basophils Relative: 1 %
EOS ABS: 0.6 10*3/uL (ref 0.0–0.7)
EOS PCT: 5 %
HCT: 21.4 % — ABNORMAL LOW (ref 39.0–52.0)
Hemoglobin: 7.1 g/dL — ABNORMAL LOW (ref 13.0–17.0)
Lymphocytes Relative: 18 %
Lymphs Abs: 2 10*3/uL (ref 0.7–4.0)
MCH: 31 pg (ref 26.0–34.0)
MCHC: 33.2 g/dL (ref 30.0–36.0)
MCV: 93.4 fL (ref 78.0–100.0)
Monocytes Absolute: 1.2 10*3/uL — ABNORMAL HIGH (ref 0.1–1.0)
Monocytes Relative: 11 %
Neutro Abs: 7.2 10*3/uL (ref 1.7–7.7)
Neutrophils Relative %: 65 %
PLATELETS: 476 10*3/uL — AB (ref 150–400)
RBC: 2.29 MIL/uL — AB (ref 4.22–5.81)
RDW: 20.6 % — AB (ref 11.5–15.5)
WBC: 11.1 10*3/uL — AB (ref 4.0–10.5)

## 2015-08-30 LAB — TYPE AND SCREEN
ABO/RH(D): O POS
Antibody Screen: NEGATIVE
UNIT DIVISION: 0

## 2015-08-30 LAB — RETICULOCYTES
RBC.: 2.29 MIL/uL — ABNORMAL LOW (ref 4.22–5.81)
RETIC COUNT ABSOLUTE: 93.9 10*3/uL (ref 19.0–186.0)
RETIC CT PCT: 4.1 % — AB (ref 0.4–3.1)

## 2015-08-30 NOTE — Progress Notes (Signed)
SICKLE CELL SERVICE PROGRESS NOTE  [name] [id number] DOB: 1980-02-03 DOA: 08/28/2015 PCP: [name], MD  Assessment/Plan: Active Problems:   Hb-SS disease with crisis ([place])   Sickle cell pain crisis ([place])  1. Hypoxemia: Pt has had a weight gain since admission. He has not had Torsemide since admission. Will give Torsemide today and re-evaluate weight tomorrow. Dry weight is 78.3 kg.  2. Hb SS with crisis: Will continue PCA, clinician assisted doses and  continue Toradol. Continue home MS Contin. 3. Anemia of chronic disease: Pt has chronic anemia and had a baseline Hb of 7 g/dl. Hb up to 7.1 g/dl after transfusion of 1 unit RBC.  4. Leukocytosis: Patient has chronically elevated white blood cell count. No evidence of infection. Likely related to bone marrow activity.  5. Chronic Anticoagulation: Pt was recently at [facility] and was resumed on Xarelto.  6.  Pulmonary Hypertension and Cor Pulmonale: He was seen by Dr. Christy Sartorius Test at [facility] and therapy with Letairis (Ambrisentan)  5 mg was initiated. Pt had recent follow up with Dr. Karena Addison on July 26th. Continue Ambisentan. 7. Proteinuria. Will continue Lisinopril as this was prescribed not foro HTN but rather for reno-vascular protective effects.  8. Chronic pain: Continue MS Contin and Gabapentin.   Code Status: Full Code Family Communication: N/A Disposition Plan: Anticipate discharge home tomorrow  [name] A.  Pager [phone]. If 7PM-7AM, please contact night-coverage.  08/30/2015, 12:47 PM  LOS: 2 days   Interim History: Pt reports pain as 7/10 and localized to RLQ of chest.  Consultants:  None  Procedures:  None  Antibiotics:  None   Objective: Vitals:   08/30/15 0319 08/30/15 0502 08/30/15 0800 08/30/15 1021  BP:  (!) 141/73  116/73  Pulse:  (!) 104  80  Resp: 18 16 15 16   Temp:  97.8 F (36.6 C)    TempSrc:  Oral    SpO2: 93% 93% 100% 96%  Weight:      Height:        Weight change:   Intake/Output Summary (Last 24 hours) at 08/30/15 1247 Last data filed at 08/30/15 0503  Gross per 24 hour  Intake              575 ml  Output             2250 ml  Net            -1675 ml    General: Alert, awake, oriented x3, in no apparent distress.  HEENT: [place]/AT PEERL, EOMI, mild icterus Neck: Trachea midline,  no masses, no thyromegal,y no JVD, no carotid bruit OROPHARYNX:  Moist, No exudate/ erythema/lesions.  Heart: Regular rate and rhythm, without murmurs, rubs, gallops, PMI non-displaced, no heaves or thrills on palpation.  Lungs: Clear to auscultation, no wheezing or rhonchi noted. No increased vocal fremitus resonant to percussion  Abdomen: Soft, non-tender,  nondistended, positive bowel sounds, no masses no hepatosplenomegaly noted. No RUQ tenderness. Neuro: No focal neurological deficits noted cranial nerves II through XII grossly intact.  Strength at functional baseline in bilateral upper and lower extremities. Musculoskeletal: No warmth swelling or erythema around joints, no spinal tenderness noted. Psychiatric: Patient alert and oriented x3, good insight and cognition, good recent to remote recall.    Data Reviewed: Basic Metabolic Panel:  Recent Labs Lab 08/28/15 0854  NA 137  K 4.6  CL 109  CO2 22  GLUCOSE 116*  BUN 22*  CREATININE 0.98  CALCIUM  8.8*   Liver Function Tests:  Recent Labs Lab 08/28/15 0854  AST 42*  ALT 23  ALKPHOS 94  BILITOT 3.5*  PROT 7.6  ALBUMIN 4.6   No results for input(s): LIPASE, AMYLASE in the last 168 hours. No results for input(s): AMMONIA in the last 168 hours. CBC:  Recent Labs Lab 08/28/15 0854 08/29/15 0500 08/30/15 1040  WBC 12.6* 14.1* 11.1*  NEUTROABS 8.8* 9.6* 7.2  HGB 7.0* 6.8* 7.1*  HCT 21.2* 20.5* 21.4*  MCV 95.1 94.9 93.4  PLT 492* 502* 476*   Cardiac Enzymes: No results for input(s): CKTOTAL, CKMB, CKMBINDEX, TROPONINI in the last 168 hours. BNP (last 3  results)  Recent Labs  09/10/14 0612 11/30/14 0910 08/02/15 2307  BNP 549.5* 639.7* 503.2*    ProBNP (last 3 results) No results for input(s): PROBNP in the last 8760 hours.  CBG: No results for input(s): GLUCAP in the last 168 hours.  No results found for this or any previous visit (from the past 240 hour(s)).   Studies: Dg Chest 2 View  Result Date: 08/28/2015 CLINICAL DATA:  Sickle cell crisis, chest pain, shortness of breath EXAM: CHEST  2 VIEW COMPARISON:  PA and lateral chest x-ray of August 07, 2015 FINDINGS: The lungs are well-expanded. The interstitial markings are increased. Confluent alveolar opacity is present in the left upper lobe. The cardiac silhouette is chronically enlarged. The pulmonary vascularity is chronically engorged centrally. There is no pleural effusion. There is mild multilevel degenerative disc disease of the thoracic spine. The Port-A-Cath appliance catheter tip projects over the distal third of the SVC. IMPRESSION: Chronic cardiomegaly and central pulmonary vascular congestion. Patchy alveolar opacity in the left upper leg and may reflect pneumonia or confluent edema. Electronically Signed   By: David  Martinique M.D.   On: 08/28/2015 08:34  Dg Chest 2 View  Result Date: 08/07/2015 CLINICAL DATA:  Sickle cell crisis with shortness of breath and cough EXAM: CHEST  2 VIEW COMPARISON:  08/02/2015 FINDINGS: Cardiac shadow remains enlarged. Left chest wall port is again seen and stable. Mild interstitial changes are noted throughout both lungs. No acute bony abnormality is noted. IMPRESSION: Mild increased interstitial changes without focal infiltrate. Electronically Signed   By: Inez Catalina M.D.   On: 08/07/2015 14:13   Dg Chest 2 View  Result Date: 08/02/2015 CLINICAL DATA:  Acute onset of shortness of breath and lower chest pain. Initial encounter. EXAM: CHEST  2 VIEW COMPARISON:  Chest radiograph performed 07/18/2015 FINDINGS: The lungs are well-aerated and clear.  There is no evidence of focal opacification, pleural effusion or pneumothorax. The heart is enlarged. A left-sided chest port is noted ending about the distal SVC. No acute osseous abnormalities are seen. IMPRESSION: Cardiomegaly.  Lungs remain grossly clear. Electronically Signed   By: Garald Balding M.D.   On: 08/02/2015 22:44    Scheduled Meds: . sodium chloride   Intravenous Once  . ambrisentan  5 mg Oral QHS  . antiseptic oral rinse  7 mL Mouth Rinse BID  . aspirin  81 mg Oral Daily  . cholecalciferol  2,000 Units Oral Daily  . Deferasirox  1,080 mg Oral Daily  . folic acid  1 mg Oral q morning - 10a  . gabapentin  300 mg Oral TID  . HYDROmorphone   Intravenous Q4H  . hydroxyurea  500 mg Oral TID  . ketorolac  30 mg Intravenous Q6H  . lisinopril  5 mg Oral Daily  . morphine  30 mg  Oral Q12H  . rivaroxaban  20 mg Oral Q supper  . senna-docusate  1 tablet Oral BID   Continuous Infusions: . dextrose 5 % and 0.45% NaCl 10 mL/hr at 08/29/15 0948    In excess of 25 minutes was spent during this visit. Greater than 50% of time was spent in face-to-face contact, assessment, counseling and coordination of care.

## 2015-08-31 LAB — HEMOGLOBINOPATHY EVALUATION
HGB A2 QUANT: 3.6 % — AB (ref 0.7–3.1)
HGB C: 0 %
HGB F QUANT: 8.2 % — AB (ref 0.0–2.0)
HGB S QUANTITAION: 29.8 % — AB
Hgb A: 58.4 % — ABNORMAL LOW (ref 94.0–98.0)

## 2015-08-31 LAB — BASIC METABOLIC PANEL
Anion gap: 6 (ref 5–15)
BUN: 22 mg/dL — AB (ref 6–20)
CALCIUM: 8.8 mg/dL — AB (ref 8.9–10.3)
CO2: 25 mmol/L (ref 22–32)
CREATININE: 0.87 mg/dL (ref 0.61–1.24)
Chloride: 109 mmol/L (ref 101–111)
GFR calc non Af Amer: >60 mL/min (ref >60)
GLUCOSE: 117 mg/dL — AB (ref 65–99)
Potassium: 4.3 mmol/L (ref 3.5–5.1)
Sodium: 140 mmol/L (ref 135–145)

## 2015-08-31 MED ORDER — FUROSEMIDE 10 MG/ML IJ SOLN
20.0000 mg | Freq: Once | INTRAMUSCULAR | Status: AC
  Administered 2015-08-31: 20 mg via INTRAVENOUS
  Filled 2015-08-31: qty 2

## 2015-08-31 MED ORDER — HYDROMORPHONE HCL 4 MG PO TABS
4.0000 mg | ORAL_TABLET | ORAL | Status: DC
  Administered 2015-08-31 – 2015-09-02 (×11): 4 mg via ORAL
  Filled 2015-08-31 (×11): qty 1

## 2015-08-31 NOTE — Progress Notes (Signed)
SICKLE CELL SERVICE PROGRESS NOTE  [name] [id number] DOB: 1980-06-07 DOA: 08/28/2015 PCP: [name], MD  Assessment/Plan: Active Problems:   Hb-SS disease with crisis ([place])   Sickle cell pain crisis ([place])  1. Hypoxemia: Pt continues to have hypoxemia. He has had a weight gain since admission. He received Torsomide yesterday.   Will give IV Lasix and Torsemide today and re-evaluate weight tomorrow. Dry weight is 78.3 kg. If still hypoxic will repeat a CXR and consider transfusing another RBC's.  2. Hb SS with crisis: Will schedule oral dilaudid and continue MS Contin, PCA and Toradol. Discontinue clinician assisted doses. Electrophoresis pending 3. Anemia of chronic disease: Pt has chronic anemia and had a baseline Hb of 7 g/dl. Hb up to 7.1 g/dl after transfusion of 1 unit RBC.  4. Leukocytosis: Patient has chronically elevated white blood cell count. No evidence of infection. Likely related to bone marrow activity.  5. Chronic Anticoagulation: Pt was recently at [facility] and was resumed on Xarelto.  6.  Pulmonary Hypertension and Cor Pulmonale: He was seen by Dr. Christy Sartorius Test at [facility] and therapy with Letairis (Ambrisentan)  5 mg was initiated. Pt had recent follow up with Dr. Karena Addison on July 26th. Continue Ambisentan. 7. Proteinuria. Will continue Lisinopril as this was prescribed not foro HTN but rather for reno-vascular protective effects.  8. Chronic pain: Continue MS Contin and Gabapentin.   Code Status: Full Code Family Communication: N/A Disposition Plan: Anticipate discharge home tomorrow  Stephens Shreve A.  Pager [phone]. If 7PM-7AM, please contact night-coverage.  08/31/2015, 4:11 PM  LOS: 3 days   Interim History: Pt reports pain as 6-7/10 and localized to RLQ of chest.  Consultants:  None  Procedures:  None  Antibiotics:  None   Objective: Vitals:   08/31/15 0818 08/31/15 0939 08/31/15 1200 08/31/15 1343  BP:  (!)  129/91  134/84  Pulse:  74  77  Resp: 11 15 (!) 9 14  Temp:  98.7 F (37.1 C)  98.1 F (36.7 C)  TempSrc:  Oral  Oral  SpO2: 100% 100% 99% 95%  Weight:   80.3 kg (177 lb 0.5 oz)   Height:       Weight change:   Intake/Output Summary (Last 24 hours) at 08/31/15 1611 Last data filed at 08/31/15 0939  Gross per 24 hour  Intake              240 ml  Output             1775 ml  Net            -1535 ml    General: Alert, awake, oriented x3, in no apparent distress.  HEENT: [place]/AT PEERL, EOMI, mild icterus Neck: Trachea midline,  no masses, no thyromegal,y no JVD, no carotid bruit OROPHARYNX:  Moist, No exudate/ erythema/lesions.  Heart: Regular rate and rhythm. Pt has II/VI SEM at base, no rubs or gallops, PMI non-displaced, no heaves or thrills on palpation.  Lungs: Clear to auscultation, no wheezing or rhonchi noted. No increased vocal fremitus resonant to percussion  Abdomen: Soft, non-tender,  nondistended, positive bowel sounds, no masses no hepatosplenomegaly noted. No RUQ tenderness. Neuro: No focal neurological deficits noted cranial nerves II through XII grossly intact.  Strength at functional baseline in bilateral upper and lower extremities. Musculoskeletal: No warmth swelling or erythema around joints, no spinal tenderness noted. Psychiatric: Patient alert and oriented x3, good insight and cognition, good recent to remote recall.  Data Reviewed: Basic Metabolic Panel:  Recent Labs Lab 08/28/15 0854 08/31/15 1354  NA 137 140  K 4.6 4.3  CL 109 109  CO2 22 25  GLUCOSE 116* 117*  BUN 22* 22*  CREATININE 0.98 0.87  CALCIUM 8.8* 8.8*   Liver Function Tests:  Recent Labs Lab 08/28/15 0854  AST 42*  ALT 23  ALKPHOS 94  BILITOT 3.5*  PROT 7.6  ALBUMIN 4.6   No results for input(s): LIPASE, AMYLASE in the last 168 hours. No results for input(s): AMMONIA in the last 168 hours. CBC:  Recent Labs Lab 08/28/15 0854 08/29/15 0500 08/30/15 1040  WBC 12.6*  14.1* 11.1*  NEUTROABS 8.8* 9.6* 7.2  HGB 7.0* 6.8* 7.1*  HCT 21.2* 20.5* 21.4*  MCV 95.1 94.9 93.4  PLT 492* 502* 476*   Cardiac Enzymes: No results for input(s): CKTOTAL, CKMB, CKMBINDEX, TROPONINI in the last 168 hours. BNP (last 3 results)  Recent Labs  09/10/14 0612 11/30/14 0910 08/02/15 2307  BNP 549.5* 639.7* 503.2*    ProBNP (last 3 results) No results for input(s): PROBNP in the last 8760 hours.  CBG: No results for input(s): GLUCAP in the last 168 hours.  No results found for this or any previous visit (from the past 240 hour(s)).   Studies: Dg Chest 2 View  Result Date: 08/28/2015 CLINICAL DATA:  Sickle cell crisis, chest pain, shortness of breath EXAM: CHEST  2 VIEW COMPARISON:  PA and lateral chest x-ray of August 07, 2015 FINDINGS: The lungs are well-expanded. The interstitial markings are increased. Confluent alveolar opacity is present in the left upper lobe. The cardiac silhouette is chronically enlarged. The pulmonary vascularity is chronically engorged centrally. There is no pleural effusion. There is mild multilevel degenerative disc disease of the thoracic spine. The Port-A-Cath appliance catheter tip projects over the distal third of the SVC. IMPRESSION: Chronic cardiomegaly and central pulmonary vascular congestion. Patchy alveolar opacity in the left upper leg and may reflect pneumonia or confluent edema. Electronically Signed   By: David  Martinique M.D.   On: 08/28/2015 08:34  Dg Chest 2 View  Result Date: 08/07/2015 CLINICAL DATA:  Sickle cell crisis with shortness of breath and cough EXAM: CHEST  2 VIEW COMPARISON:  08/02/2015 FINDINGS: Cardiac shadow remains enlarged. Left chest wall port is again seen and stable. Mild interstitial changes are noted throughout both lungs. No acute bony abnormality is noted. IMPRESSION: Mild increased interstitial changes without focal infiltrate. Electronically Signed   By: Inez Catalina M.D.   On: 08/07/2015 14:13   Dg Chest  2 View  Result Date: 08/02/2015 CLINICAL DATA:  Acute onset of shortness of breath and lower chest pain. Initial encounter. EXAM: CHEST  2 VIEW COMPARISON:  Chest radiograph performed 07/18/2015 FINDINGS: The lungs are well-aerated and clear. There is no evidence of focal opacification, pleural effusion or pneumothorax. The heart is enlarged. A left-sided chest port is noted ending about the distal SVC. No acute osseous abnormalities are seen. IMPRESSION: Cardiomegaly.  Lungs remain grossly clear. Electronically Signed   By: Garald Balding M.D.   On: 08/02/2015 22:44    Scheduled Meds: . sodium chloride   Intravenous Once  . ambrisentan  5 mg Oral QHS  . antiseptic oral rinse  7 mL Mouth Rinse BID  . aspirin  81 mg Oral Daily  . cholecalciferol  2,000 Units Oral Daily  . Deferasirox  1,080 mg Oral Daily  . folic acid  1 mg Oral q morning - 10a  .  gabapentin  300 mg Oral TID  . HYDROmorphone   Intravenous Q4H  . hydroxyurea  500 mg Oral TID  . ketorolac  30 mg Intravenous Q6H  . lisinopril  5 mg Oral Daily  . morphine  30 mg Oral Q12H  . rivaroxaban  20 mg Oral Q supper  . senna-docusate  1 tablet Oral BID   Continuous Infusions: . dextrose 5 % and 0.45% NaCl 10 mL/hr at 08/29/15 0948    In excess of 25 minutes was spent during this visit. Greater than 50% of time was spent in face-to-face contact, assessment, counseling and coordination of care.

## 2015-08-31 NOTE — Progress Notes (Signed)
Ambulatory sats on room air 77%. Sats with 4L O2 99%.

## 2015-09-01 ENCOUNTER — Inpatient Hospital Stay (HOSPITAL_COMMUNITY): Payer: Medicare Other

## 2015-09-01 DIAGNOSIS — D598 Other acquired hemolytic anemias: Secondary | ICD-10-CM

## 2015-09-01 DIAGNOSIS — J810 Acute pulmonary edema: Secondary | ICD-10-CM

## 2015-09-01 DIAGNOSIS — D571 Sickle-cell disease without crisis: Secondary | ICD-10-CM

## 2015-09-01 LAB — CBC WITH DIFFERENTIAL/PLATELET
BASOS PCT: 1 %
Basophils Absolute: 0.1 10*3/uL (ref 0.0–0.1)
EOS ABS: 0.4 10*3/uL (ref 0.0–0.7)
EOS PCT: 4 %
HCT: 20 % — ABNORMAL LOW (ref 39.0–52.0)
Hemoglobin: 6.8 g/dL — CL (ref 13.0–17.0)
Lymphocytes Relative: 17 %
Lymphs Abs: 1.9 10*3/uL (ref 0.7–4.0)
MCH: 31.9 pg (ref 26.0–34.0)
MCHC: 34 g/dL (ref 30.0–36.0)
MCV: 93.9 fL (ref 78.0–100.0)
MONO ABS: 1.4 10*3/uL — AB (ref 0.1–1.0)
MONOS PCT: 13 %
Neutro Abs: 7.3 10*3/uL (ref 1.7–7.7)
Neutrophils Relative %: 65 %
Platelets: 469 10*3/uL — ABNORMAL HIGH (ref 150–400)
RBC: 2.13 MIL/uL — ABNORMAL LOW (ref 4.22–5.81)
RDW: 20.4 % — AB (ref 11.5–15.5)
WBC: 11.1 10*3/uL — ABNORMAL HIGH (ref 4.0–10.5)

## 2015-09-01 LAB — BASIC METABOLIC PANEL
Anion gap: 6 (ref 5–15)
BUN: 26 mg/dL — ABNORMAL HIGH (ref 6–20)
CALCIUM: 8.6 mg/dL — AB (ref 8.9–10.3)
CHLORIDE: 107 mmol/L (ref 101–111)
CO2: 27 mmol/L (ref 22–32)
CREATININE: 0.98 mg/dL (ref 0.61–1.24)
GFR calc Af Amer: >60 mL/min (ref >60)
GFR calc non Af Amer: >60 mL/min (ref >60)
GLUCOSE: 118 mg/dL — AB (ref 65–99)
Potassium: 4.1 mmol/L (ref 3.5–5.1)
Sodium: 140 mmol/L (ref 135–145)

## 2015-09-01 LAB — RETICULOCYTES
RBC.: 2.13 MIL/uL — AB (ref 4.22–5.81)
RETIC CT PCT: 3.3 % — AB (ref 0.4–3.1)
Retic Count, Absolute: 70.3 10*3/uL (ref 19.0–186.0)

## 2015-09-01 LAB — DIRECT ANTIGLOBULIN TEST (NOT AT ARMC)
DAT, COMPLEMENT: NEGATIVE
DAT, IGG: NEGATIVE

## 2015-09-01 LAB — LACTATE DEHYDROGENASE: LDH: 436 U/L — ABNORMAL HIGH (ref 98–192)

## 2015-09-01 LAB — PREPARE RBC (CROSSMATCH)

## 2015-09-01 MED ORDER — SODIUM CHLORIDE 0.9% FLUSH
10.0000 mL | Freq: Two times a day (BID) | INTRAVENOUS | Status: DC
Start: 2015-09-01 — End: 2015-09-02
  Administered 2015-09-01: 10 mL

## 2015-09-01 MED ORDER — SODIUM CHLORIDE 0.9% FLUSH
10.0000 mL | INTRAVENOUS | Status: DC | PRN
Start: 2015-09-01 — End: 2015-09-02
  Administered 2015-09-02: 10 mL
  Filled 2015-09-01: qty 40

## 2015-09-01 MED ORDER — HYDROMORPHONE HCL 2 MG/ML IJ SOLN
2.0000 mg | INTRAMUSCULAR | Status: DC | PRN
  Administered 2015-09-01 – 2015-09-02 (×6): 2 mg via INTRAVENOUS
  Filled 2015-09-01 (×6): qty 1

## 2015-09-01 MED ORDER — SODIUM CHLORIDE 0.9 % IV SOLN
Freq: Once | INTRAVENOUS | Status: DC
Start: 2015-09-01 — End: 2015-09-02

## 2015-09-01 NOTE — Care Management Note (Signed)
Case Management Note  Patient Details  Name: [name] MRN: [id number] Date of Birth: [birth year]  Subjective/Objective:      35 yo admitted with [facility]              Action/Plan: From home with parent. He is no longer requiring home 02 per MD note. Chart reviewed and no CM needs identified or communicated. CM will continue to follow.   Expected Discharge Date:   (unknown)               Expected Discharge Plan:  Home/Self Care  In-House Referral:     Discharge planning Services  CM Consult  Post Acute Care Choice:    Choice offered to:     DME Arranged:    DME Agency:     HH Arranged:    HH Agency:     Status of Service:  In process, will continue to follow  If discussed at Long Length of Stay Meetings, dates discussed:    Additional CommentsLynnell Catalan, RN 09/01/2015, 11:12 AM  [phone]

## 2015-09-01 NOTE — Care Management Important Message (Signed)
Important Message  Patient Details  Name: [name] MRN: [id number] Date of Birth: [birth year]   Medicare Important Message Given:  Yes    [name] 09/01/2015, 9:33 AMImportant Message  Patient Details  Name: [name] MRN: [id number] Date of Birth: [birth year]   Medicare Important Message Given:  Yes    [name] 09/01/2015, 9:33 AM

## 2015-09-01 NOTE — Progress Notes (Signed)
CRITICAL VALUE ALERT  Critical value received:  hgb 6.8  Date of notification:  8/1  Time of notification:  0635  Critical value read back:Yes.    Nurse who received alert:  c Makenzie Weisner RN  MD notified (1st page):  kirby  Time of first page:    MD notified (2nd page):  Time of second page:  Responding MD:    Time MD responded:

## 2015-09-01 NOTE — Progress Notes (Signed)
SICKLE CELL SERVICE PROGRESS NOTE  [name] [id number] DOB: [birth year] DOA: 08/28/2015 PCP: [name], MD  Assessment/Plan: Active Problems:   Hb-SS disease with crisis ([place])   Sickle cell pain crisis ([place])  1. Hypoxemia: Pt continues to have hypoxemia. Repeat CXR shows no findings consistent with infiltrates or opacities which was likely due fluid as it  Has resolved with diuresis. Dry weight is 78.3 kg. Weight today is 79.9 which is still above dry weight. Will give Torsemide today. Continue to support with supplemental Oxygen.  2. Anemia of Chronic Disease: Pt reports hypoxemia and pain occurring at about the 3rd week after red cell pheresis. He also reports increased jaundice prior to hospitalization. This may be indicative of a late transfusion reaction with hemolysis leading to hypoxemia. His LDH is mildly elevated from his baseline. Will check a coombs test and hold on further transfusion for now. His  HB S is at 24% thus no added benefit from further transfusions at this time.  3.  Hb SS with crisis:With Hb S at 24%, pain is unlikely secondary to crisis. Will discontinue PCA and continue on home medicine regimen for chronic pain .  4. Anemia of chronic disease: Pt has chronic anemia and had a baseline Hb of 7 g/dl. Hb at 6.6 g/dl after transfusion of 1 unit RBC.  5. Leukocytosis: Patient has chronically elevated white blood cell count. No evidence of infection. Likely related to bone marrow activity.  6. Chronic Anticoagulation: Pt was recently at [facility] and was resumed on Xarelto.  7.  Pulmonary Hypertension and Cor Pulmonale: He was seen by Dr. Christy Sartorius Test at [facility] and therapy with Letairis (Ambrisentan)  5 mg was initiated. Pt had recent follow up with Dr. Karena Addison on July 26th. Continue Ambisentan. 8. Proteinuria. Will continue Lisinopril as this was prescribed not for HTN but rather for reno-vascular protective effects.  9. Chronic pain: Continue  MS Contin and Gabapentin.   Code Status: Full Code Family Communication: N/A Disposition Plan: Anticipate discharge home tomorrow  [name],[name]  Pager [phone]. If 7PM-7AM, please contact night-coverage.  09/01/2015, 1:31 PM  LOS: 4 days   Interim History: Pt reports pain as 6/10 and localized to RLQ of chest.  Consultants:  None  Procedures:  None  Antibiotics:  None   Objective: Vitals:   09/01/15 0837 09/01/15 0954 09/01/15 1044 09/01/15 1229  BP:  120/73 114/62   Pulse:  83    Resp: 13 14  12   Temp:  98.5 F (36.9 C)    TempSrc:  Oral    SpO2: 94% 93%  97%  Weight:      Height:       Weight change: -1.6 kg (-3 lb 8.4 oz)  Intake/Output Summary (Last 24 hours) at 09/01/15 1331 Last data filed at 09/01/15 1231  Gross per 24 hour  Intake              730 ml  Output             1550 ml  Net             -820 ml    General: Alert, awake, oriented x3, in no apparent distress.  HEENT: [place]/AT PEERL, EOMI, mild icterus Neck: Trachea midline,  no masses, no thyromegal,y no JVD, no carotid bruit OROPHARYNX:  Moist, No exudate/ erythema/lesions.  Heart: Regular rate and rhythm. Pt has II/VI SEM at base, no rubs or gallops, PMI non-displaced, no heaves or thrills  on palpation.  Lungs: Clear to auscultation, no wheezing or rhonchi noted. No increased vocal fremitus resonant to percussion  Abdomen: Soft, non-tender,  nondistended, positive bowel sounds, no masses no hepatosplenomegaly noted. No RUQ tenderness. Neuro: No focal neurological deficits noted cranial nerves II through XII grossly intact.  Strength at functional baseline in bilateral upper and lower extremities. Musculoskeletal: No warmth swelling or erythema around joints, no spinal tenderness noted.    Data Reviewed: Basic Metabolic Panel:  Recent Labs Lab 08/28/15 0854 08/31/15 1354 09/01/15 0623  NA 137 140 140  K 4.6 4.3 4.1  CL 109 109 107  CO2 22 25 27   GLUCOSE 116* 117* 118*  BUN  22* 22* 26*  CREATININE 0.98 0.87 0.98  CALCIUM 8.8* 8.8* 8.6*   Liver Function Tests:  Recent Labs Lab 08/28/15 0854  AST 42*  ALT 23  ALKPHOS 94  BILITOT 3.5*  PROT 7.6  ALBUMIN 4.6   No results for input(s): LIPASE, AMYLASE in the last 168 hours. No results for input(s): AMMONIA in the last 168 hours. CBC:  Recent Labs Lab 08/28/15 0854 08/29/15 0500 08/30/15 1040 09/01/15 0623  WBC 12.6* 14.1* 11.1* 11.1*  NEUTROABS 8.8* 9.6* 7.2 7.3  HGB 7.0* 6.8* 7.1* 6.8*  HCT 21.2* 20.5* 21.4* 20.0*  MCV 95.1 94.9 93.4 93.9  PLT 492* 502* 476* 469*   Cardiac Enzymes: No results for input(s): CKTOTAL, CKMB, CKMBINDEX, TROPONINI in the last 168 hours. BNP (last 3 results)  Recent Labs  09/10/14 0612 11/30/14 0910 08/02/15 2307  BNP 549.5* 639.7* 503.2*    ProBNP (last 3 results) No results for input(s): PROBNP in the last 8760 hours.  CBG: No results for input(s): GLUCAP in the last 168 hours.  No results found for this or any previous visit (from the past 240 hour(s)).   Studies: Dg Chest 2 View  Result Date: 09/01/2015 CLINICAL DATA:  Sickle cell crisis.  Hypoxemia.  Weakness today. EXAM: CHEST  2 VIEW COMPARISON:  08/28/2015 FINDINGS: Left-sided Port-A-Cath tip to the level of the lower superior vena cava -right atrial junction. Heart is markedly enlarged and stable in configuration. There is mild pulmonary vascular congestion. No overt edema. There is minimal right lower lobe atelectasis. No pleural effusions or consolidations. Surgical clips are noted in the upper abdomen. Note is made of sclerosis within the right humeral head, raising the question of avascular necrosis. Left humeral head is not imaged today. IMPRESSION: Cardiomegaly and vascular congestion.  Right lower lobe atelectasis. Question avascular necrosis of the right humeral head. Electronically Signed   By: Nolon Nations M.D.   On: 09/01/2015 10:44   Dg Chest 2 View  Result Date:  08/28/2015 CLINICAL DATA:  Sickle cell crisis, chest pain, shortness of breath EXAM: CHEST  2 VIEW COMPARISON:  PA and lateral chest x-ray of August 07, 2015 FINDINGS: The lungs are well-expanded. The interstitial markings are increased. Confluent alveolar opacity is present in the left upper lobe. The cardiac silhouette is chronically enlarged. The pulmonary vascularity is chronically engorged centrally. There is no pleural effusion. There is mild multilevel degenerative disc disease of the thoracic spine. The Port-A-Cath appliance catheter tip projects over the distal third of the SVC. IMPRESSION: Chronic cardiomegaly and central pulmonary vascular congestion. Patchy alveolar opacity in the left upper leg and may reflect pneumonia or confluent edema. Electronically Signed   By: David  Martinique M.D.   On: 08/28/2015 08:34  Dg Chest 2 View  Result Date: 08/07/2015 CLINICAL DATA:  Sickle  cell crisis with shortness of breath and cough EXAM: CHEST  2 VIEW COMPARISON:  08/02/2015 FINDINGS: Cardiac shadow remains enlarged. Left chest wall port is again seen and stable. Mild interstitial changes are noted throughout both lungs. No acute bony abnormality is noted. IMPRESSION: Mild increased interstitial changes without focal infiltrate. Electronically Signed   By: Inez Catalina M.D.   On: 08/07/2015 14:13   Dg Chest 2 View  Result Date: 08/02/2015 CLINICAL DATA:  Acute onset of shortness of breath and lower chest pain. Initial encounter. EXAM: CHEST  2 VIEW COMPARISON:  Chest radiograph performed 07/18/2015 FINDINGS: The lungs are well-aerated and clear. There is no evidence of focal opacification, pleural effusion or pneumothorax. The heart is enlarged. A left-sided chest port is noted ending about the distal SVC. No acute osseous abnormalities are seen. IMPRESSION: Cardiomegaly.  Lungs remain grossly clear. Electronically Signed   By: Garald Balding M.D.   On: 08/02/2015 22:44    Scheduled Meds: . sodium chloride    Intravenous Once  . sodium chloride   Intravenous Once  . ambrisentan  5 mg Oral QHS  . antiseptic oral rinse  7 mL Mouth Rinse BID  . aspirin  81 mg Oral Daily  . cholecalciferol  2,000 Units Oral Daily  . Deferasirox  1,080 mg Oral Daily  . folic acid  1 mg Oral q morning - 10a  . gabapentin  300 mg Oral TID  . HYDROmorphone   Intravenous Q4H  . HYDROmorphone  4 mg Oral Q4H  . ketorolac  30 mg Intravenous Q6H  . lisinopril  5 mg Oral Daily  . morphine  30 mg Oral Q12H  . rivaroxaban  20 mg Oral Q supper  . senna-docusate  1 tablet Oral BID  . sodium chloride flush  10-40 mL Intracatheter Q12H   Continuous Infusions: . dextrose 5 % and 0.45% NaCl 10 mL/hr at 08/29/15 0948    In excess of 25 minutes was spent during this visit. Greater than 50% of time was spent in face-to-face contact, assessment, counseling and coordination of care.

## 2015-09-01 NOTE — Progress Notes (Signed)
PHARMACY BRIEF NOTE: HYDROXYUREA   By [facility] policy, hydroxyurea is automatically held when any of the following laboratory values occur:  ANC < 2 K  Pltc < 80K in sickle-cell patients; < 100K in other patients  Hgb <= 6 in sickle-cell patients; < 8 in other patients  Reticulocytes < 80K when Hgb < 9  Hydroxyurea has been held (discontinued from profile) per policy.    Gretta Arab PharmD, BCPS Pager [phone] 09/01/2015 8:00 AM

## 2015-09-02 DIAGNOSIS — D599 Acquired hemolytic anemia, unspecified: Secondary | ICD-10-CM

## 2015-09-02 LAB — BASIC METABOLIC PANEL
Anion gap: 5 (ref 5–15)
BUN: 21 mg/dL — AB (ref 6–20)
CO2: 26 mmol/L (ref 22–32)
Calcium: 8.3 mg/dL — ABNORMAL LOW (ref 8.9–10.3)
Chloride: 108 mmol/L (ref 101–111)
Creatinine, Ser: 0.82 mg/dL (ref 0.61–1.24)
Glucose, Bld: 122 mg/dL — ABNORMAL HIGH (ref 65–99)
POTASSIUM: 3.9 mmol/L (ref 3.5–5.1)
SODIUM: 139 mmol/L (ref 135–145)

## 2015-09-02 LAB — CBC WITH DIFFERENTIAL/PLATELET
BASOS ABS: 0.1 10*3/uL (ref 0.0–0.1)
Basophils Relative: 1 %
EOS ABS: 0.4 10*3/uL (ref 0.0–0.7)
EOS PCT: 4 %
HCT: 20 % — ABNORMAL LOW (ref 39.0–52.0)
Hemoglobin: 6.8 g/dL — CL (ref 13.0–17.0)
LYMPHS PCT: 23 %
Lymphs Abs: 2.2 10*3/uL (ref 0.7–4.0)
MCH: 31.8 pg (ref 26.0–34.0)
MCHC: 34 g/dL (ref 30.0–36.0)
MCV: 93.5 fL (ref 78.0–100.0)
Monocytes Absolute: 1.2 10*3/uL — ABNORMAL HIGH (ref 0.1–1.0)
Monocytes Relative: 13 %
NEUTROS PCT: 59 %
Neutro Abs: 5.4 10*3/uL (ref 1.7–7.7)
PLATELETS: 477 10*3/uL — AB (ref 150–400)
RBC: 2.14 MIL/uL — AB (ref 4.22–5.81)
RDW: 20.4 % — ABNORMAL HIGH (ref 11.5–15.5)
WBC: 9.3 10*3/uL (ref 4.0–10.5)

## 2015-09-02 LAB — RETICULOCYTES
RBC.: 2.14 MIL/uL — ABNORMAL LOW (ref 4.22–5.81)
RETIC COUNT ABSOLUTE: 55.6 10*3/uL (ref 19.0–186.0)
RETIC CT PCT: 2.6 % (ref 0.4–3.1)

## 2015-09-02 MED ORDER — SODIUM CHLORIDE 0.9% FLUSH
10.0000 mL | INTRAVENOUS | Status: AC | PRN
  Administered 2015-09-02: 10 mL

## 2015-09-02 MED ORDER — HEPARIN SOD (PORK) LOCK FLUSH 100 UNIT/ML IV SOLN
500.0000 [IU] | INTRAVENOUS | Status: AC | PRN
  Administered 2015-09-02: 500 [IU]
  Filled 2015-09-02: qty 5

## 2015-09-02 NOTE — Discharge Summary (Signed)
[name] MRN: [id number] DOB/AGE: 1980/10/18 35 y.o.  Admit date: 08/28/2015 Discharge date: 09/02/2015  Primary Care Physician:  [name], MD   Discharge Diagnoses:   Patient Active Problem List   Diagnosis Date Noted  . Cor pulmonale, chronic ([place]) 08/25/2014    Priority: High  . Pulmonary HTN ([place]) 06/18/2013    Priority: High  . Functional asplenia     Priority: High  . Anemia of chronic disease 06/25/2014    Priority: Medium  . Hb-SS disease with crisis ([place]) 01/22/2014    Priority: Medium  . Chronic anticoagulation 08/22/2013    Priority: Medium  . Avascular necrosis ([place])     Priority: Low  . Shortness of breath   . Pulmonary embolism without acute cor pulmonale ([place])   . Sickle cell anemia with pain ([place]) 05/07/2015  . Hypoxia   . Anemia 01/31/2015  . Symptomatic anemia   . Hb-SS disease without crisis ([place]) 10/07/2014  . PVC's (premature ventricular contractions) 09/10/2014  . Abnormal EKG 09/10/2014  . Chronic atrial fibrillation ([place]) 09/09/2014  . Sickle cell anemia ([place]) 06/25/2014  . PAH (pulmonary artery hypertension) ([place]) 03/18/2014  . Paralytic strabismus, external ophthalmoplegia   . Chronic pain syndrome 12/12/2013  . Essential hypertension 08/22/2013  . Vitamin D deficiency 02/13/2013  . Hx of pulmonary embolus 06/29/2012  . Hemochromatosis 12/14/2011    DISCHARGE MEDICATION:   Medication List    TAKE these medications   ambrisentan 5 MG tablet Commonly known as:  LETAIRIS Take 5 mg by mouth at bedtime.   aspirin 81 MG chewable tablet Chew 1 tablet (81 mg total) by mouth daily.   folic acid 1 MG tablet Commonly known as:  FOLVITE Take 1 tablet (1 mg total) by mouth every morning.   gabapentin 300 MG capsule Commonly known as:  NEURONTIN Take 1 capsule (300 mg total) by mouth 3 (three) times daily.   HYDROmorphone 4 MG tablet Commonly known as:  DILAUDID Take 1 tablet (4 mg total) by mouth every 4 (four) hours as  needed for severe pain.   hydroxyurea 500 MG capsule Commonly known as:  HYDREA Take 2 capsules (1,000 mg total) by mouth daily. May take with food to minimize GI side effects. What changed:  how much to take  when to take this  additional instructions   JADENU 360 MG Tabs Generic drug:  Deferasirox Take 1,080 mg by mouth daily.   lisinopril 5 MG tablet Commonly known as:  PRINIVIL,ZESTRIL Take 1 tablet (5 mg total) by mouth daily.   morphine 30 MG 12 hr tablet Commonly known as:  MS CONTIN Take 1 tablet (30 mg total) by mouth every 12 (twelve) hours.   potassium chloride SA 20 MEQ tablet Commonly known as:  K-DUR,KLOR-CON Take 1 tablet (20 mEq total) by mouth every morning.   rivaroxaban 20 MG Tabs tablet Commonly known as:  XARELTO Take 20 mg by mouth daily with supper.   tadalafil 10 MG tablet Commonly known as:  CIALIS Take 1 tablet (10 mg total) by mouth daily as needed for erectile dysfunction.   torsemide 20 MG tablet Commonly known as:  DEMADEX Take 20 mg by mouth daily as needed. For increase in patient weight greater than 3 lbs.   Vitamin D 2000 units tablet Take 1 tablet (2,000 Units total) by mouth daily.   zolpidem 10 MG tablet Commonly known as:  AMBIEN Take 1 tablet (10 mg total) by mouth at bedtime as needed for sleep.  Consults:    SIGNIFICANT DIAGNOSTIC STUDIES:  Dg Chest 2 View  Result Date: 09/01/2015 CLINICAL DATA:  Sickle cell crisis.  Hypoxemia.  Weakness today. EXAM: CHEST  2 VIEW COMPARISON:  08/28/2015 FINDINGS: Left-sided Port-A-Cath tip to the level of the lower superior vena cava -right atrial junction. Heart is markedly enlarged and stable in configuration. There is mild pulmonary vascular congestion. No overt edema. There is minimal right lower lobe atelectasis. No pleural effusions or consolidations. Surgical clips are noted in the upper abdomen. Note is made of sclerosis within the right humeral head, raising the  question of avascular necrosis. Left humeral head is not imaged today. IMPRESSION: Cardiomegaly and vascular congestion.  Right lower lobe atelectasis. Question avascular necrosis of the right humeral head. Electronically Signed   By: Nolon Nations M.D.   On: 09/01/2015 10:44   Dg Chest 2 View  Result Date: 08/28/2015 CLINICAL DATA:  Sickle cell crisis, chest pain, shortness of breath EXAM: CHEST  2 VIEW COMPARISON:  PA and lateral chest x-ray of August 07, 2015 FINDINGS: The lungs are well-expanded. The interstitial markings are increased. Confluent alveolar opacity is present in the left upper lobe. The cardiac silhouette is chronically enlarged. The pulmonary vascularity is chronically engorged centrally. There is no pleural effusion. There is mild multilevel degenerative disc disease of the thoracic spine. The Port-A-Cath appliance catheter tip projects over the distal third of the SVC. IMPRESSION: Chronic cardiomegaly and central pulmonary vascular congestion. Patchy alveolar opacity in the left upper leg and may reflect pneumonia or confluent edema. Electronically Signed   By: David  Martinique M.D.   On: 08/28/2015 08:34  Dg Chest 2 View  Result Date: 08/07/2015 CLINICAL DATA:  Sickle cell crisis with shortness of breath and cough EXAM: CHEST  2 VIEW COMPARISON:  08/02/2015 FINDINGS: Cardiac shadow remains enlarged. Left chest wall port is again seen and stable. Mild interstitial changes are noted throughout both lungs. No acute bony abnormality is noted. IMPRESSION: Mild increased interstitial changes without focal infiltrate. Electronically Signed   By: Inez Catalina M.D.   On: 08/07/2015 14:13       No results found for this or any previous visit (from the past 240 hour(s)).  BRIEF ADMITTING H & P: Holton is well known to me as an opiate tolerant patient with Hb ss who receives serial  RBC pheresis and also has pulmonary Hypertension. He had been on Oxygen around the clock and was recently  taken off of oxygen ATC since his six minute walk test was negative for hypoxemia. He presented to the ED today with c/o pain and was found to be hypoxic with oxygen saturations on RA at 85%. He was also recently started on Ambrisentan for treatment of pulmonary Hypertension. Due to his hypoxemia a CXR was performed which showed an opacity on the LUL.. I spoke with his girlfriend who is a Marine scientist and she reports that he appears to start becoming Hypoxic at about the 3rd week after Pheresis. A review of his labs from [place] show that his last electrophoresis showed a post procedure value of Hb S 24% and Hb A 68 %. He denies any fevers, chills, cough, SOB or DOE.   In the ED he received 3 doses of Dilaudid and pain was still uncontrolled and I am asked to admit him for Hypoxia and Hb SS with crisis.     Hospital Course:  Present on Admission: Is an opiate tolerant patient with hemoglobin SS, pulmonary hypertension and who receives  [facility]heres[facility] at [facility]. The patient presented approximately 3 weeks after his last Red cell pheresis with objective and subjective hypoxemia and dyspnea on exertion. The patient also reported jaundice prior to hospitalization. He was found to have an acute on chronic anemia with hemoglobin lower than his baseline. Due to the acute anemia in the setting of hypoxemia, the patient received 1 unit of red blood cells in transfusion here. There is some concern as to whether or not the patient may be having a transfusion reaction and I discussed this with his hematologist Dr. Jacinta Shoe at [facility]. Combs test was performed here and found to be negative for direct antiglobulin complement and direct antiglobulin IgG. Nevertheless at the time of discharge his hemoglobin was 6.6 g/dL. Hemoglobin electrophoresis was performed by hospitalized which showed his hemoglobin S 29.8%, hemoglobin A 58.4% and hemoglobin at 8.2%. The patient was saturating at 98% on room air. He  had some pulmonary edema on chest x-ray and his weight on admission was (81.8 kg) which is above his dry weight oof 78.3 kg  . He received torsemide and Lasix and at the time of discharge his weight was 79.8 kg. The patient is advised to continue his torsemide and to check daily weights. For his sickle cell crisis,  The pain was initially treated with Dilaudid via PCA, Toradol and clinician assisted doses. As the pain resolved he was transitioned to oral analgesics and is discharged home on his pre-hospital regimen.   Disposition and Follow-up:  Patient is discharged in good condition without any hypoxemia and is to follow-up with his hematologist at an appointment that they will arrange for the patient, his primary care physician as needed and to present for his red cell pheresis as previously scheduled.   DISCHARGE EXAM:  General: Alert, awake, oriented x3, in no apparent distress.  HEENT: [place]/AT PEERL, EOMI, mild icterus Neck: Trachea midline, no masses, no thyromegal,y no JVD, no carotid bruit OROPHARYNX: Moist, No exudate/ erythema/lesions.  Heart: Regular rate and rhythm, without murmurs, rubs, gallops or S3. PMI non-displaced. Exam reveals no decreased pulses. Pulmonary/Chest: Normal effort. Breath sounds normal. No. Apnea. Clear to auscultation,no stridor,  no wheezing and no rhonchi noted. No respiratory distress and no tenderness noted. Abdomen: Soft, nontender, nondistended, normal bowel sounds, no masses no hepatosplenomegaly noted. No fluid wave and no ascites. There is no guarding or rebound. Neuro: Alert and oriented to person, place and time. Normal motor skills, Displays no atrophy or tremors and exhibits normal muscle tone.  No focal neurological deficits noted cranial nerves II through XII grossly intact. No sensory deficit noted. Strength at baseline in bilateral upper and lower extremities. Gait normal. Musculoskeletal: No warm swelling or erythema around joints, no spinal  tenderness noted. Psychiatric: Patient alert and oriented x3, good insight and cognition, mood, memory, affect and judgement normal.  Lymph node survey: No cervical axillary or inguinal lymphadenopathy noted. Skin: Skin is warm and dry. No bruising, no ecchymosis and no rash noted. Pt is not diaphoretic. No erythema. No pallor l  Blood pressure 104/71, pulse 66, temperature 98.2 F (36.8 C), temperature source Axillary, resp. rate 15, height 6' (1.829 m), weight 79.8 kg (175 lb 14.8 oz), SpO2 99 %.   Recent Labs  09/01/15 0623 09/02/15 1020  NA 140 139  K 4.1 3.9  CL 107 108  CO2 27 26  GLUCOSE 118* 122*  BUN 26* 21*  CREATININE 0.98 0.82  CALCIUM 8.6* 8.3*   No results  for input(s): AST, ALT, ALKPHOS, BILITOT, PROT, ALBUMIN in the last 72 hours. No results for input(s): LIPASE, AMYLASE in the last 72 hours.  Recent Labs  09/01/15 0623 09/02/15 1020  WBC 11.1* 9.3  NEUTROABS 7.3 5.4  HGB 6.8* 6.8*  HCT 20.0* 20.0*  MCV 93.9 93.5  PLT 469* 477*     Total time spent including face to face and decision making was greater than 30 minutes  Signed: Siyah Mault A. 09/02/2015, 12:59 PM

## 2015-09-02 NOTE — Progress Notes (Signed)
Discharge instructions reviewed with patient, questions answered, verbalized understanding. Home meds returned to patient from our pharmacy.  Patient ambulatory from unit to be taken home by significant other.

## 2015-09-02 NOTE — Progress Notes (Addendum)
Patient ambulatory around entire length of unit on room air.  Lowest oxygen saturation 76, did maintain in low to mid 80's.

## 2015-09-02 NOTE — Progress Notes (Signed)
Patient ambulatory around unit on 1 liter of O2, oxygen saturations remained between 83 and 87%.

## 2015-09-03 ENCOUNTER — Telehealth: Payer: Self-pay

## 2015-09-03 NOTE — Telephone Encounter (Signed)
Refill request on ambien, dilaudid, and ms contin. Please advise. Thanks!

## 2015-09-04 ENCOUNTER — Telehealth (HOSPITAL_COMMUNITY): Payer: Self-pay | Admitting: Internal Medicine

## 2015-09-04 ENCOUNTER — Other Ambulatory Visit: Payer: Self-pay | Admitting: Internal Medicine

## 2015-09-04 ENCOUNTER — Non-Acute Institutional Stay (HOSPITAL_COMMUNITY)
Admission: AD | Admit: 2015-09-04 | Discharge: 2015-09-04 | Disposition: A | Discharge status: Home / Self Care | Payer: Medicare Other | Source: Ambulatory Visit | Attending: Internal Medicine | Admitting: Internal Medicine

## 2015-09-04 ENCOUNTER — Encounter (HOSPITAL_COMMUNITY): Payer: Self-pay

## 2015-09-04 DIAGNOSIS — Z7901 Long term (current) use of anticoagulants: Secondary | ICD-10-CM | POA: Diagnosis not present

## 2015-09-04 DIAGNOSIS — Z86711 Personal history of pulmonary embolism: Secondary | ICD-10-CM | POA: Insufficient documentation

## 2015-09-04 DIAGNOSIS — Z87891 Personal history of nicotine dependence: Secondary | ICD-10-CM | POA: Diagnosis not present

## 2015-09-04 DIAGNOSIS — I10 Essential (primary) hypertension: Secondary | ICD-10-CM | POA: Insufficient documentation

## 2015-09-04 DIAGNOSIS — I272 Other secondary pulmonary hypertension: Secondary | ICD-10-CM | POA: Diagnosis not present

## 2015-09-04 DIAGNOSIS — D5700 Hb-SS disease with crisis, unspecified: Secondary | ICD-10-CM

## 2015-09-04 DIAGNOSIS — G894 Chronic pain syndrome: Secondary | ICD-10-CM

## 2015-09-04 DIAGNOSIS — Z96641 Presence of right artificial hip joint: Secondary | ICD-10-CM | POA: Insufficient documentation

## 2015-09-04 DIAGNOSIS — Z9981 Dependence on supplemental oxygen: Secondary | ICD-10-CM | POA: Insufficient documentation

## 2015-09-04 DIAGNOSIS — Z79899 Other long term (current) drug therapy: Secondary | ICD-10-CM | POA: Insufficient documentation

## 2015-09-04 DIAGNOSIS — Z7982 Long term (current) use of aspirin: Secondary | ICD-10-CM | POA: Insufficient documentation

## 2015-09-04 DIAGNOSIS — G4700 Insomnia, unspecified: Secondary | ICD-10-CM

## 2015-09-04 DIAGNOSIS — M79606 Pain in leg, unspecified: Secondary | ICD-10-CM | POA: Diagnosis present

## 2015-09-04 DIAGNOSIS — Z86718 Personal history of other venous thrombosis and embolism: Secondary | ICD-10-CM | POA: Diagnosis not present

## 2015-09-04 DIAGNOSIS — J9610 Chronic respiratory failure, unspecified whether with hypoxia or hypercapnia: Secondary | ICD-10-CM | POA: Insufficient documentation

## 2015-09-04 MED ORDER — POLYETHYLENE GLYCOL 3350 17 G PO PACK
17.0000 g | PACK | Freq: Every day | ORAL | Status: DC | PRN
  Filled 2015-09-04: qty 1

## 2015-09-04 MED ORDER — SODIUM CHLORIDE 0.9 % IV SOLN
25.0000 mg | INTRAVENOUS | Status: DC | PRN
  Filled 2015-09-04: qty 0.5

## 2015-09-04 MED ORDER — MORPHINE SULFATE ER 30 MG PO TBCR: 30.0000 mg | EXTENDED_RELEASE_TABLET | Freq: Two times a day (BID) | ORAL | 0 refills | Status: DC

## 2015-09-04 MED ORDER — HYDROMORPHONE HCL 4 MG PO TABS: 4.0000 mg | ORAL_TABLET | ORAL | 0 refills | Status: DC | PRN

## 2015-09-04 MED ORDER — HEPARIN SOD (PORK) LOCK FLUSH 100 UNIT/ML IV SOLN
500.0000 [IU] | INTRAVENOUS | Status: AC | PRN
  Administered 2015-09-04: 500 [IU]
  Filled 2015-09-04: qty 5

## 2015-09-04 MED ORDER — ZOLPIDEM TARTRATE 10 MG PO TABS: 10.0000 mg | ORAL_TABLET | Freq: Every evening | ORAL | 0 refills | Status: DC | PRN

## 2015-09-04 MED ORDER — SENNOSIDES-DOCUSATE SODIUM 8.6-50 MG PO TABS: 1.0000 | ORAL_TABLET | Freq: Two times a day (BID) | ORAL | Status: DC

## 2015-09-04 MED ORDER — SODIUM CHLORIDE 0.9% FLUSH: 9.0000 mL | INTRAVENOUS | Status: DC | PRN

## 2015-09-04 MED ORDER — KETOROLAC TROMETHAMINE 30 MG/ML IJ SOLN
30.0000 mg | Freq: Four times a day (QID) | INTRAMUSCULAR | Status: DC
  Administered 2015-09-04: 30 mg via INTRAVENOUS
  Filled 2015-09-04: qty 1

## 2015-09-04 MED ORDER — NALOXONE HCL 0.4 MG/ML IJ SOLN: 0.4000 mg | INTRAMUSCULAR | Status: DC | PRN

## 2015-09-04 MED ORDER — SODIUM CHLORIDE 0.9% FLUSH
10.0000 mL | INTRAVENOUS | Status: AC | PRN
  Administered 2015-09-04: 10 mL

## 2015-09-04 MED ORDER — HYDROMORPHONE 1 MG/ML IV SOLN
INTRAVENOUS | Status: DC
  Administered 2015-09-04: 15 mg via INTRAVENOUS
  Administered 2015-09-04: 12:00:00 via INTRAVENOUS
  Filled 2015-09-04: qty 25

## 2015-09-04 MED ORDER — DIPHENHYDRAMINE HCL 25 MG PO CAPS: 25.0000 mg | ORAL_CAPSULE | ORAL | Status: DC | PRN

## 2015-09-04 MED ORDER — DEXTROSE-NACL 5-0.45 % IV SOLN
INTRAVENOUS | Status: DC
  Administered 2015-09-04: 12:00:00 via INTRAVENOUS

## 2015-09-04 MED ORDER — ONDANSETRON HCL 4 MG/2ML IJ SOLN: 4.0000 mg | Freq: Four times a day (QID) | INTRAMUSCULAR | Status: DC | PRN

## 2015-09-04 NOTE — Progress Notes (Signed)
Pt discharged to home; discharge instructions explained, given, and signed; all questions answered; port deaccessed with no complications noted; pt alert, oriented, and ambulatory

## 2015-09-04 NOTE — Discharge Summary (Signed)
Physician Discharge Summary  [name] [id number] DOB: [birth year] DOA: 09/04/2015  PCP: [name], MD  Admit date: 09/04/2015  Discharge date: 09/04/2015  Time spent: 30 minutes  Discharge Diagnoses:  Active Problems:   Sickle cell anemia with crisis [facility])   Discharge Condition: Stable  Diet recommendation: Regular  History of present illness:  [name] is a 35 y.o. male with history of sickle cell disease, acute chest syndrome, DVT, PE on Xarelto, pulmonary hypertension, cor pulmonale, chronic respiratory failure on home oxygen, nonadherent with oxygen use and medications for SCD except the narcotics. He presented to the day hospital today with C/O pain to shoulders, and bilateral ribs. Patient rates pain at 8/10 on pain scale. Patient denies chest pain, shortness of breath, difficulty breathing. Patient denies N/V/D and abdominal pain. Patient states he has taken his home pain medication without improvement for several days. He was just discharged from the hospital after 5 days of admission for sickle cell pain crises. He also got transfused one unit of red blood cell. He was recently seen by Dr. Christy Sartorius Test at [facility] and Milda Smart (Ambrisentan) 5 mg was initiated for [facility] Course:  [name] was admitted to the day hospital with sickle cell painful crisis. Patient was treated with weight based IV Dilaudid PCA, IV Toradol as well as IV fluids. Cornelia showed significant improvement symptomatically, pain improved from 8 to 2 out of 10 at the time of discharge. [name] will follow-up at the clinic as previously scheduled, continue with home medications as per prior to admission.  Discharge Instructions We discussed the need for good hydration, monitoring of hydration status, avoidance of heat, cold, stress, and infection triggers. We discussed the need to be compliant with taking Hydrea. Aadish was reminded of the need to seek  medical attention of any symptoms of bleeding, anemia, or infection occurs. Discharge Exam: Vitals:   09/04/15 1256 09/04/15 1356  BP: 114/74 118/79  Pulse: 75 77  Resp: 18 14  Temp:      General appearance: alert, cooperative and no distress Eyes: conjunctivae/corneas clear. PERRL, EOM's intact. Fundi benign. Neck: no adenopathy, no carotid bruit, no JVD, supple, symmetrical, trachea midline and thyroid not enlarged, symmetric, no tenderness/mass/nodules Back: symmetric, no curvature. ROM normal. No CVA tenderness. Resp: clear to auscultation bilaterally Chest wall: no tenderness Cardio: regular rate and rhythm, S1, S2 normal, no murmur, click, rub or gallop GI: soft, non-tender; bowel sounds normal; no masses, no organomegaly Extremities: extremities normal, atraumatic, no cyanosis or edema Pulses: 2+ and symmetric Skin: Skin color, texture, turgor normal. No rashes or lesions Neurologic: Grossly normal   Discharge Instructions    Increase activity slowly    Complete by:  As directed     Current Discharge Medication List    CONTINUE these medications which have NOT CHANGED   Details  ambrisentan (LETAIRIS) 5 MG tablet Take 5 mg by mouth at bedtime.     aspirin 81 MG chewable tablet Chew 1 tablet (81 mg total) by mouth daily. Qty: 30 tablet, Refills: 11    Cholecalciferol (VITAMIN D) 2000 units tablet Take 1 tablet (2,000 Units total) by mouth daily. Qty: 90 tablet, Refills: 3   Associated Diagnoses: Vitamin D deficiency    Deferasirox (JADENU) 360 MG TABS Take 1,080 mg by mouth daily.     folic acid (FOLVITE) 1 MG tablet Take 1 tablet (1 mg total) by mouth every morning. Qty: 30 tablet, Refills: 11   Associated  Diagnoses: Hb-SS disease without crisis ([place])    gabapentin (NEURONTIN) 300 MG capsule Take 1 capsule (300 mg total) by mouth 3 (three) times daily. Qty: 270 capsule, Refills: 3    HYDROmorphone (DILAUDID) 4 MG tablet Take 1 tablet (4 mg total) by mouth  every 4 (four) hours as needed for severe pain. Qty: 90 tablet, Refills: 0   Associated Diagnoses: Sickle-cell disease with pain ([place]); Chronic pain syndrome    hydroxyurea (HYDREA) 500 MG capsule Take 2 capsules (1,000 mg total) by mouth daily. May take with food to minimize GI side effects. Qty: 60 capsule, Refills: 5   Associated Diagnoses: Hb-SS disease without crisis (HCC)    lisinopril (PRINIVIL,ZESTRIL) 5 MG tablet Take 1 tablet (5 mg total) by mouth daily. Qty: 30 tablet, Refills: 1    morphine (MS CONTIN) 30 MG 12 hr tablet Take 1 tablet (30 mg total) by mouth every 12 (twelve) hours. Qty: 60 tablet, Refills: 0   Associated Diagnoses: Chronic pain syndrome    potassium chloride SA (K-DUR,KLOR-CON) 20 MEQ tablet Take 1 tablet (20 mEq total) by mouth every morning. Qty: 30 tablet, Refills: 3   Associated Diagnoses: Pulmonary HTN (HCC)    rivaroxaban (XARELTO) 20 MG TABS tablet Take 20 mg by mouth daily with supper.    tadalafil (CIALIS) 10 MG tablet Take 1 tablet (10 mg total) by mouth daily as needed for erectile dysfunction. Qty: 10 tablet, Refills: 0    torsemide (DEMADEX) 20 MG tablet Take 20 mg by mouth daily as needed. For increase in patient weight greater than 3 lbs.    zolpidem (AMBIEN) 10 MG tablet Take 1 tablet (10 mg total) by mouth at bedtime as needed for sleep. Qty: 30 tablet, Refills: 0   Associated Diagnoses: Insomnia       No Known Allergies   Significant Diagnostic Studies: Dg Chest 2 View  Result Date: 09/01/2015 CLINICAL DATA:  Sickle cell crisis.  Hypoxemia.  Weakness today. EXAM: CHEST  2 VIEW COMPARISON:  08/28/2015 FINDINGS: Left-sided Port-A-Cath tip to the level of the lower superior vena cava -right atrial junction. Heart is markedly enlarged and stable in configuration. There is mild pulmonary vascular congestion. No overt edema. There is minimal right lower lobe atelectasis. No pleural effusions or consolidations. Surgical clips are noted in  the upper abdomen. Note is made of sclerosis within the right humeral head, raising the question of avascular necrosis. Left humeral head is not imaged today. IMPRESSION: Cardiomegaly and vascular congestion.  Right lower lobe atelectasis. Question avascular necrosis of the right humeral head. Electronically Signed   By: Nolon Nations M.D.   On: 09/01/2015 10:44   Dg Chest 2 View  Result Date: 08/28/2015 CLINICAL DATA:  Sickle cell crisis, chest pain, shortness of breath EXAM: CHEST  2 VIEW COMPARISON:  PA and lateral chest x-ray of August 07, 2015 FINDINGS: The lungs are well-expanded. The interstitial markings are increased. Confluent alveolar opacity is present in the left upper lobe. The cardiac silhouette is chronically enlarged. The pulmonary vascularity is chronically engorged centrally. There is no pleural effusion. There is mild multilevel degenerative disc disease of the thoracic spine. The Port-A-Cath appliance catheter tip projects over the distal third of the SVC. IMPRESSION: Chronic cardiomegaly and central pulmonary vascular congestion. Patchy alveolar opacity in the left upper leg and may reflect pneumonia or confluent edema. Electronically Signed   By: David  Martinique M.D.   On: 08/28/2015 08:34  Dg Chest 2 View  Result Date: 08/07/2015 CLINICAL  DATA:  Sickle cell crisis with shortness of breath and cough EXAM: CHEST  2 VIEW COMPARISON:  08/02/2015 FINDINGS: Cardiac shadow remains enlarged. Left chest wall port is again seen and stable. Mild interstitial changes are noted throughout both lungs. No acute bony abnormality is noted. IMPRESSION: Mild increased interstitial changes without focal infiltrate. Electronically Signed   By: Inez Catalina M.D.   On: 08/07/2015 14:13    Signed:  [name] MD, [place], [place], [place], CPE   09/04/2015, 2:59 PM

## 2015-09-04 NOTE — Discharge Instructions (Signed)
Sickle Cell Anemia, Adult Sickle cell anemia is a condition in which red blood cells have an abnormal "sickle" shape. This abnormal shape shortens the cells' life span, which results in a lower than normal concentration of red blood cells in the blood. The sickle shape also causes the cells to clump together and block free blood flow through the blood vessels. As a result, the tissues and organs of the body do not receive enough oxygen. Sickle cell anemia causes organ damage and pain and increases the risk of infection. CAUSES  Sickle cell anemia is a genetic disorder. Those who receive two copies of the gene have the condition, and those who receive one copy have the trait. RISK FACTORS The sickle cell gene is most common in people whose families originated in [place]. Other areas of the globe where sickle cell trait occurs include the [place], [place] and [place], the [place], and the [place].  SIGNS AND SYMPTOMS  Pain, especially in the extremities, back, chest, or abdomen (common). The pain may start suddenly or may develop following an illness, especially if there is dehydration. Pain can also occur due to overexertion or exposure to extreme temperature changes.  Frequent severe bacterial infections, especially certain types of pneumonia and meningitis.  Pain and swelling in the hands and feet.  Decreased activity.   Loss of appetite.   Change in behavior.  Headaches.  Seizures.  Shortness of breath or difficulty breathing.  Vision changes.  Skin ulcers. Those with the trait may not have symptoms or they may have mild symptoms.  DIAGNOSIS  Sickle cell anemia is diagnosed with blood tests that demonstrate the genetic trait. It is often diagnosed during the newborn period, due to mandatory testing nationwide. A variety of blood tests, X-rays, CT scans, MRI scans, ultrasounds, and lung function tests may also be done to monitor the condition. TREATMENT  Sickle  cell anemia may be treated with:  Medicines. You may be given pain medicines, antibiotic medicines (to treat and prevent infections) or medicines to increase the production of certain types of hemoglobin.  Fluids.  Oxygen.  Blood transfusions. HOME CARE INSTRUCTIONS   Drink enough fluid to keep your urine clear or pale yellow. Increase your fluid intake in hot weather and during exercise.  Do not smoke. Smoking lowers oxygen levels in the blood.   Only take over-the-counter or prescription medicines for pain, fever, or discomfort as directed by your health care provider.  Take antibiotics as directed by your health care provider. Make sure you finish them it even if you start to feel better.   Take supplements as directed by your health care provider.   Consider wearing a medical alert bracelet. This tells anyone caring for you in an emergency of your condition.   When traveling, keep your medical information, health care provider's names, and the medicines you take with you at all times.   If you develop a fever, do not take medicines to reduce the fever right away. This could cover up a problem that is developing. Notify your health care provider.  Keep all follow-up appointments with your health care provider. Sickle cell anemia requires regular medical care. SEEK MEDICAL CARE IF: You have a fever. SEEK IMMEDIATE MEDICAL CARE IF:   You feel dizzy or faint.   You have new abdominal pain, especially on the left side near the stomach area.   You develop a persistent, often uncomfortable and painful penile erection (priapism). If this is not treated immediately it   will lead to impotence.   You have numbness your arms or legs or you have a hard time moving them.   You have a hard time with speech.   You have a fever or persistent symptoms for more than 2-3 days.   You have a fever and your symptoms suddenly get worse.   You have signs or symptoms of infection.  These include:   Chills.   Abnormal tiredness (lethargy).   Irritability.   Poor eating.   Vomiting.   You develop pain that is not helped with medicine.   You develop shortness of breath.  You have pain in your chest.   You are coughing up pus-like or bloody sputum.   You develop a stiff neck.  Your feet or hands swell or have pain.  Your abdomen appears bloated.  You develop joint pain. MAKE SURE YOU:  Understand these instructions.   This information is not intended to replace advice given to you by your health care provider. Make sure you discuss any questions you have with your health care provider.   Document Released: 04/27/2005 Document Revised: 02/07/2014 Document Reviewed: 08/29/2012 Elsevier Interactive Patient Education 2016 [employer]  

## 2015-09-04 NOTE — Telephone Encounter (Signed)
Pt called and states experiencing pain in back; states taken home pain medications with no relief; pt denies cp, shortness of breath, nausea, vomiting, diarrhea, or fever; MD notified; pt informed that he may come to the day hospital for evaluation; pt verbalizes understanding

## 2015-09-04 NOTE — H&P (Signed)
[facility] History and Physical  [name] [id number] DOB: [birth year] DOA: 09/04/2015  PCP: [name], MD   Chief Complaint: Pain in legs and lower back  HPI: [name] is a 34 y.o. male with history of sickle cell disease, acute chest syndrome, DVT, PE on Xarelto, pulmonary hypertension, cor pulmonale, chronic respiratory failure on home oxygen, nonadherent with oxygen use and medications for SCD except the narcotics. He presented to the day hospital today with C/O pain to shoulders, and bilateral ribs. Patient rates pain at 8/10 on pain scale. Patient denies chest pain, shortness of breath, difficulty breathing. Patient denies N/V/D and abdominal pain. Patient states he has taken his home pain medication without improvement for several days. He was just discharged from the hospital after 5 days of admission for sickle cell pain crises. He also got transfused one unit of red blood cell. He was recently seen by Dr. Christy Sartorius Test at [facility] and Milda Smart (Ambrisentan)  5 mg was initiated for Pulm HTN.  Systemic Review: General: The patient denies anorexia, fever, weight loss Cardiac: Denies chest pain, syncope, palpitations, pedal edema  Respiratory: Denies cough, shortness of breath, wheezing GI: Denies severe indigestion/heartburn, abdominal pain, nausea, vomiting, diarrhea and constipation GU: Denies hematuria, incontinence, dysuria  Musculoskeletal: Denies arthritis  Skin: Denies suspicious skin lesions Neurologic: Denies focal weakness or numbness, change in vision  Past Medical History:  Diagnosis Date  . Acute chest syndrome ([place]) 06/18/2013  . Acute embolism and thrombosis of right internal jugular vein ([place])   . Alcohol consumption of one to four drinks per day   . Avascular necrosis (HCC)    Right Hip  . Blood transfusion   . Chronic anticoagulation   . Demand ischemia ([place]) 01/02/2014  . Former smoker   . Functional asplenia    . Hb-SS disease with crisis ([place])   . History of Clostridium difficile infection   . History of pulmonary embolus (PE)   . Hypertension   . Hypokalemia   . Leukocytosis    Chronic  . Mood disorder ([place])   . Noncompliance with medication regimen   . Oxygen deficiency   . Pulmonary hypertension ([place])   . Second hand tobacco smoke exposure   . Sickle cell anemia (HCC)   . Sickle-cell crisis with associated acute chest syndrome ([place]) 05/13/2013  . Thrombocytosis (HCC)    Chronic  . Uses marijuana     Past Surgical History:  Procedure Laterality Date  . CHOLECYSTECTOMY     01/2008  . Excision of left periauricular cyst     10/2009  . Excision of right ear lobe cyst with primary closur     11/2007  . Porta cath placement    . Porta cath removal    . PORTACATH PLACEMENT  01/05/2012   Procedure: INSERTION PORT-A-CATH;  Surgeon: Odis Hollingshead, MD;  Location: [facility];  Service: General;  Laterality: N/A;  ultrasound guiced port a cath insertion with fluoroscopy  . Right hip replacement     08/2006  . UMBILICAL HERNIA REPAIR     01/2008    No Known Allergies  Family History  Problem Relation Age of Onset  . Sickle cell trait Mother   . Depression Mother   . Diabetes Mother   . Sickle cell trait Father   . Sickle cell trait Brother       Prior to Admission medications   Medication Sig Start Date End Date Taking? Authorizing Provider  ambrisentan (  LETAIRIS) 5 MG tablet Take 5 mg by mouth at bedtime.     Historical Provider, MD  aspirin 81 MG chewable tablet Chew 1 tablet (81 mg total) by mouth daily. 07/23/14   [name], MD  Cholecalciferol (VITAMIN D) 2000 units tablet Take 1 tablet (2,000 Units total) by mouth daily. 02/23/15   Dorena Dew, FNP  Deferasirox (JADENU) 360 MG TABS Take 1,080 mg by mouth daily.     Historical Provider, MD  folic acid (FOLVITE) 1 MG tablet Take 1 tablet (1 mg total) by mouth every morning. 02/23/15   Dorena Dew, FNP   gabapentin (NEURONTIN) 300 MG capsule Take 1 capsule (300 mg total) by mouth 3 (three) times daily. 06/01/15   Tresa Garter, MD  HYDROmorphone (DILAUDID) 4 MG tablet Take 1 tablet (4 mg total) by mouth every 4 (four) hours as needed for severe pain. 08/07/15   Tresa Garter, MD  hydroxyurea (HYDREA) 500 MG capsule Take 2 capsules (1,000 mg total) by mouth daily. May take with food to minimize GI side effects. Patient taking differently: Take 500 mg by mouth 3 (three) times daily. May take with food to minimize GI side effects. 06/01/15   Tresa Garter, MD  lisinopril (PRINIVIL,ZESTRIL) 5 MG tablet Take 1 tablet (5 mg total) by mouth daily. 08/04/15   [name], MD  morphine (MS CONTIN) 30 MG 12 hr tablet Take 1 tablet (30 mg total) by mouth every 12 (twelve) hours. 08/07/15   Tresa Garter, MD  potassium chloride SA (K-DUR,KLOR-CON) 20 MEQ tablet Take 1 tablet (20 mEq total) by mouth every morning. Patient not taking: Reported on 08/28/2015 04/13/15   Dorena Dew, FNP  rivaroxaban (XARELTO) 20 MG TABS tablet Take 20 mg by mouth daily with supper.    Historical Provider, MD  tadalafil (CIALIS) 10 MG tablet Take 1 tablet (10 mg total) by mouth daily as needed for erectile dysfunction. Patient not taking: Reported on 08/28/2015 08/07/15   Tresa Garter, MD  torsemide (DEMADEX) 20 MG tablet Take 20 mg by mouth daily as needed. For increase in patient weight greater than 3 lbs. 07/27/15 07/26/16  Historical Provider, MD  zolpidem (AMBIEN) 10 MG tablet Take 1 tablet (10 mg total) by mouth at bedtime as needed for sleep. 08/07/15   Tresa Garter, MD     Physical Exam: There were no vitals filed for this visit.  General: Alert, awake, afebrile, anicteric, not in obvious distress HEENT: Normocephalic and Atraumatic, Mucous membranes pink                PERRLA; EOM intact; mild scleral icterus,                 Nares: Patent, Oropharynx: Clear, Fair Dentition                  Neck: FROM, no cervical lymphadenopathy, thyromegaly, carotid bruit or JVD;  CHEST WALL: No tenderness  CHEST: Normal respiration, clear to auscultation bilaterally  HEART: Regular rate and rhythm; Loud P2, ? murmurs rubs or gallops  BACK: No kyphosis or scoliosis; no CVA tenderness  ABDOMEN: Positive Bowel Sounds, soft, non-tender; no masses, no organomegaly EXTREMITIES: No cyanosis, clubbing, or edema SKIN:  no rash or ulceration  CNS: Alert and Oriented x 4, Nonfocal exam, CN 2-12 intact  Labs on Admission:  Basic Metabolic Panel:  Recent Labs Lab 08/31/15 1354 09/01/15 0623 09/02/15 1020  NA 140 140 139  K 4.3 4.1 3.9  CL 109 107 108  CO2 25 27 26   GLUCOSE 117* 118* 122*  BUN 22* 26* 21*  CREATININE 0.87 0.98 0.82  CALCIUM 8.8* 8.6* 8.3*   Liver Function Tests: No results for input(s): AST, ALT, ALKPHOS, BILITOT, PROT, ALBUMIN in the last 168 hours. No results for input(s): LIPASE, AMYLASE in the last 168 hours. No results for input(s): AMMONIA in the last 168 hours. CBC:  Recent Labs Lab 08/29/15 0500 08/30/15 1040 09/01/15 0623 09/02/15 1020  WBC 14.1* 11.1* 11.1* 9.3  NEUTROABS 9.6* 7.2 7.3 5.4  HGB 6.8* 7.1* 6.8* 6.8*  HCT 20.5* 21.4* 20.0* 20.0*  MCV 94.9 93.4 93.9 93.5  PLT 502* 476* 469* 477*   Cardiac Enzymes: No results for input(s): CKTOTAL, CKMB, CKMBINDEX, TROPONINI in the last 168 hours.  BNP (last 3 results)  Recent Labs  09/10/14 0612 11/30/14 0910 08/02/15 2307  BNP 549.5* 639.7* 503.2*    ProBNP (last 3 results) No results for input(s): PROBNP in the last 8760 hours.  CBG: No results for input(s): GLUCAP in the last 168 hours.   Assessment/Plan Active Problems:   Sickle cell anemia with crisis ([place])   Admits to the Day Hospital  IVF D5 .45% Saline @ 125 mls/hour  Weight based Dilaudid PCA started within 30 minutes of admission  IV Toradol 30 mg Q 6 H  Monitor vitals very closely, Re-evaluate pain scale every  hour  2 L of Oxygen by [place]  Patient will be re-evaluated for pain in the context of function and relationship to baseline as care progresses.  If no significant relieve from pain (remains above 5/10) will transfer patient to inpatient services for further evaluation and management  Code Status: Full  Family Communication: None  DVT Prophylaxis: Ambulate as tolerated   Time spent: 63 Minutes  Inetha Maret, MD, MHA, FACP, FAAP, CPE  If 7PM-7AM, please contact night-coverage [email or web address] 09/04/2015, 11:35 AM

## 2015-09-05 LAB — TYPE AND SCREEN
ABO/RH(D): O POS
ANTIBODY SCREEN: NEGATIVE
UNIT DIVISION: 0

## 2015-09-07 DIAGNOSIS — D571 Sickle-cell disease without crisis: Secondary | ICD-10-CM | POA: Diagnosis not present

## 2015-09-07 DIAGNOSIS — Z79899 Other long term (current) drug therapy: Secondary | ICD-10-CM | POA: Diagnosis not present

## 2015-09-07 DIAGNOSIS — Z7982 Long term (current) use of aspirin: Secondary | ICD-10-CM | POA: Diagnosis not present

## 2015-09-12 IMAGING — CR DG CHEST 2V
2 series · 2 of 2 positions shown · non-contrast
Comparison: 09/27/2012; 08/18/2012; chest CT - 08/27/2012

CLINICAL DATA: Leukocytosis, sickle cell crisis, nonsmoker,
subsequent encounter.

CHEST - 2 VIEW

[w chest pa]
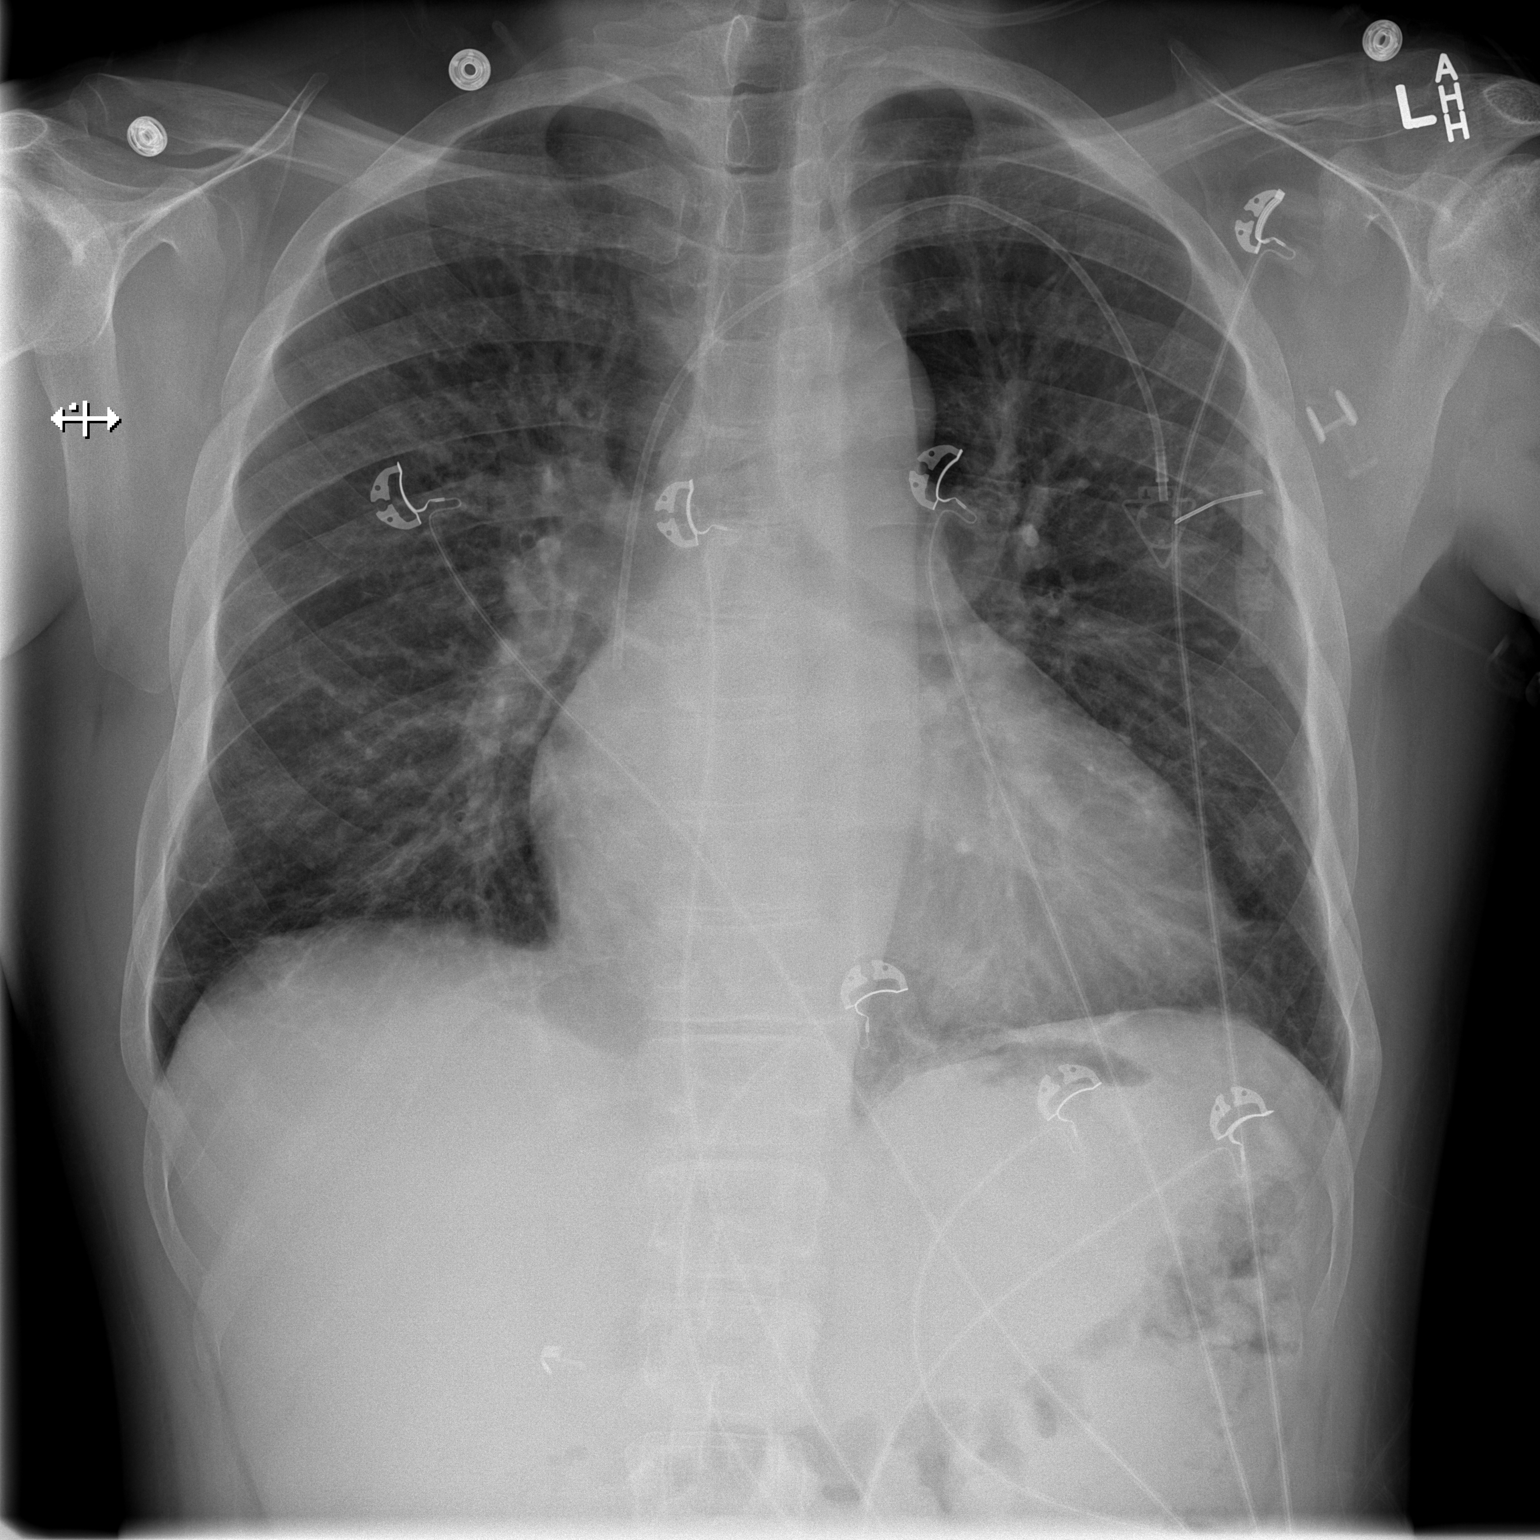

[w chest lat]
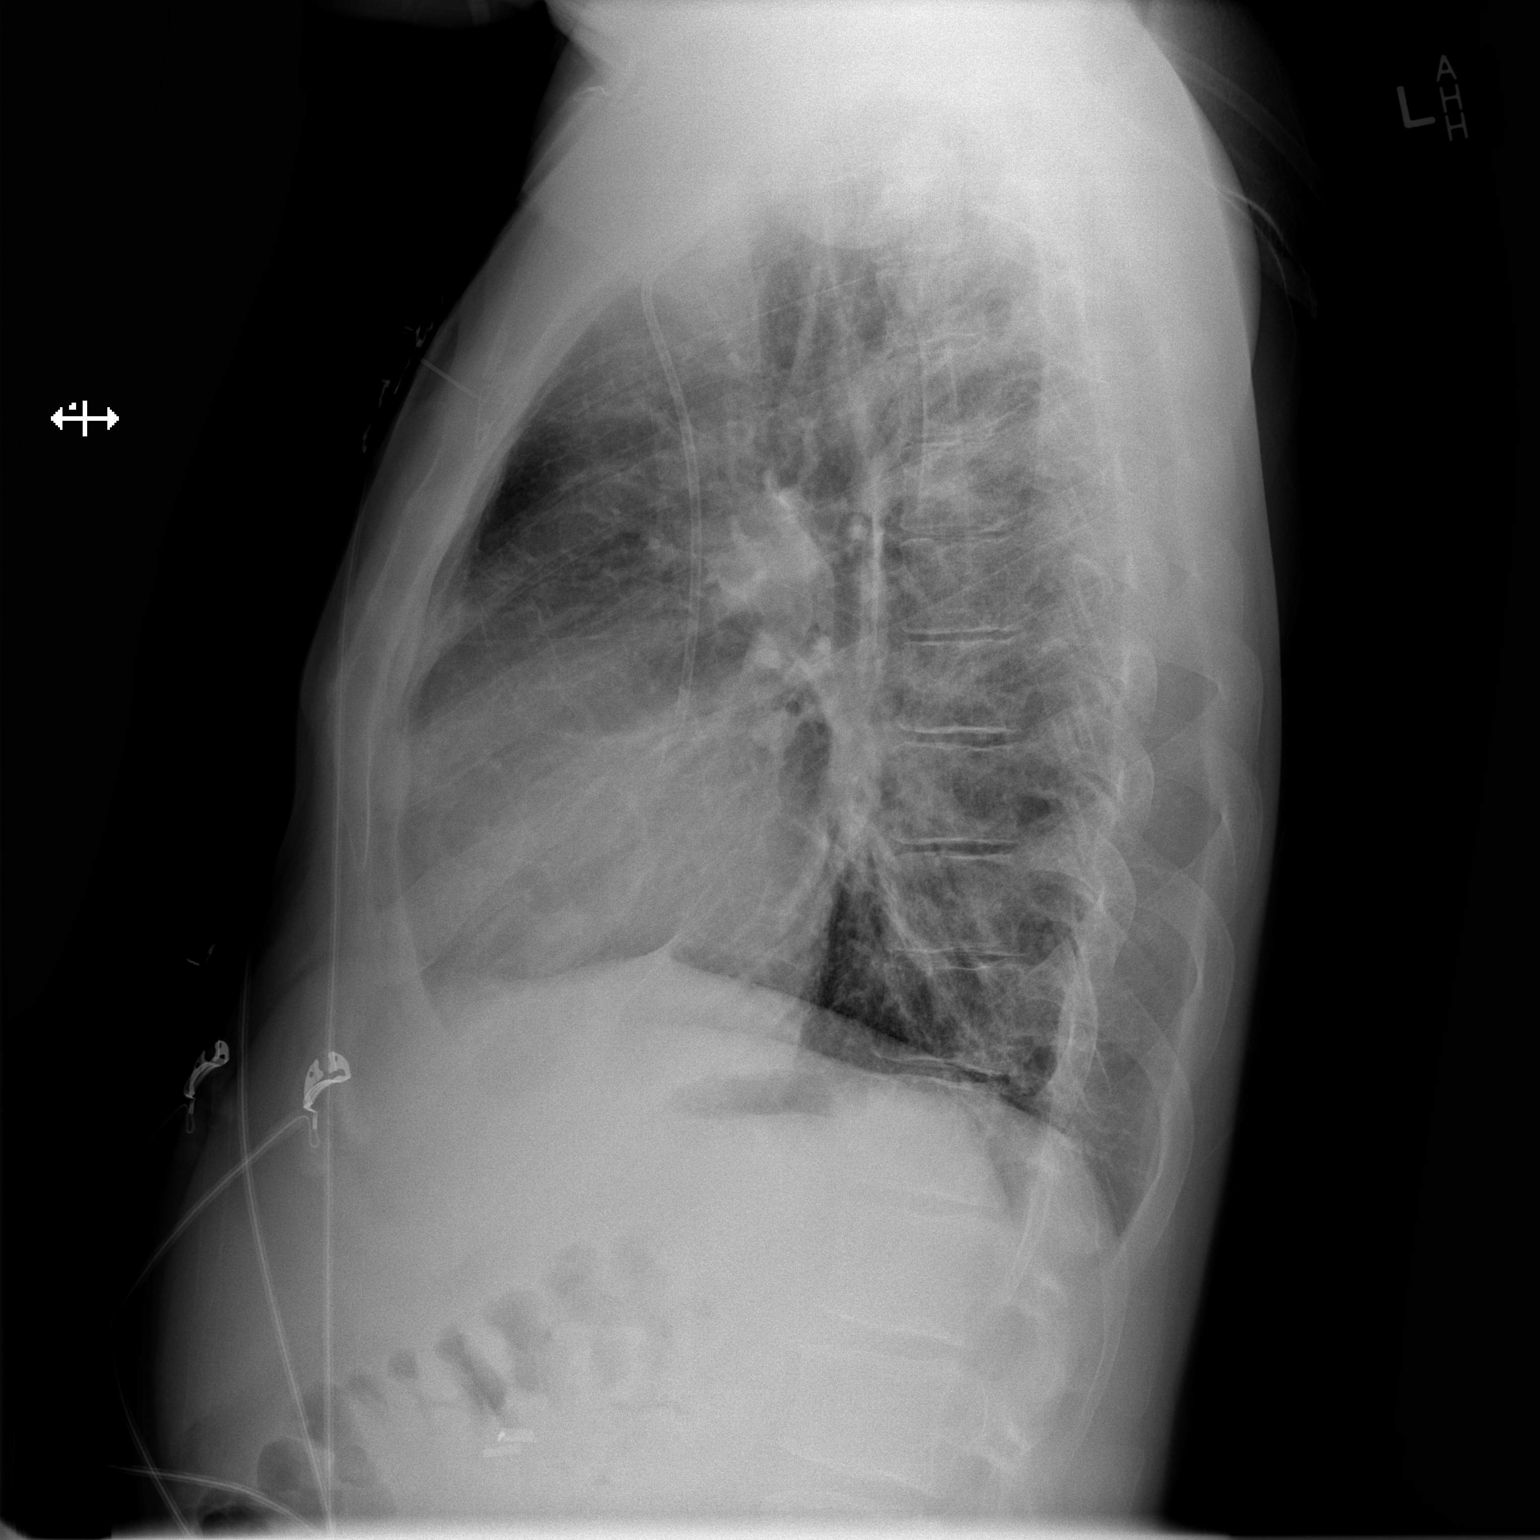

[2 of 2 positions shown; findings below may reference images not displayed]

FINDINGS: Grossly unchanged borderline enlarged cardiac silhouette
and mediastinal contours.  Stable positioning of support apparatus.
There is grossly unchanged mild diffuse slightly nodular thickening
of the pulmonary interstitium.  No new focal airspace opacity.  No
pleural effusion or pneumothorax.  Grossly unchanged bones.  Post
cholecystectomy.
IMPRESSION: Stable findings of borderline cardiomegaly and bronchitic change
without acute cardiopulmonary disease.

## 2015-09-15 ENCOUNTER — Non-Acute Institutional Stay (HOSPITAL_COMMUNITY)
Admission: AD | Admit: 2015-09-15 | Discharge: 2015-09-15 | Disposition: A | Discharge status: Home / Self Care | Payer: Medicare Other | Source: Ambulatory Visit | Attending: Internal Medicine | Admitting: Internal Medicine

## 2015-09-15 ENCOUNTER — Encounter (HOSPITAL_COMMUNITY): Payer: Self-pay | Admitting: *Deleted

## 2015-09-15 ENCOUNTER — Telehealth (HOSPITAL_COMMUNITY): Payer: Self-pay | Admitting: *Deleted

## 2015-09-15 DIAGNOSIS — I10 Essential (primary) hypertension: Secondary | ICD-10-CM | POA: Diagnosis not present

## 2015-09-15 DIAGNOSIS — I272 Other secondary pulmonary hypertension: Secondary | ICD-10-CM | POA: Insufficient documentation

## 2015-09-15 DIAGNOSIS — Z7901 Long term (current) use of anticoagulants: Secondary | ICD-10-CM | POA: Diagnosis not present

## 2015-09-15 DIAGNOSIS — Z87891 Personal history of nicotine dependence: Secondary | ICD-10-CM | POA: Diagnosis not present

## 2015-09-15 DIAGNOSIS — Z79899 Other long term (current) drug therapy: Secondary | ICD-10-CM | POA: Diagnosis not present

## 2015-09-15 DIAGNOSIS — Z86711 Personal history of pulmonary embolism: Secondary | ICD-10-CM | POA: Insufficient documentation

## 2015-09-15 DIAGNOSIS — Z86718 Personal history of other venous thrombosis and embolism: Secondary | ICD-10-CM | POA: Diagnosis not present

## 2015-09-15 DIAGNOSIS — J9610 Chronic respiratory failure, unspecified whether with hypoxia or hypercapnia: Secondary | ICD-10-CM | POA: Insufficient documentation

## 2015-09-15 DIAGNOSIS — D5700 Hb-SS disease with crisis, unspecified: Secondary | ICD-10-CM | POA: Diagnosis present

## 2015-09-15 DIAGNOSIS — Z7982 Long term (current) use of aspirin: Secondary | ICD-10-CM | POA: Insufficient documentation

## 2015-09-15 DIAGNOSIS — Z9981 Dependence on supplemental oxygen: Secondary | ICD-10-CM | POA: Insufficient documentation

## 2015-09-15 DIAGNOSIS — Z96641 Presence of right artificial hip joint: Secondary | ICD-10-CM | POA: Insufficient documentation

## 2015-09-15 DIAGNOSIS — G4700 Insomnia, unspecified: Secondary | ICD-10-CM | POA: Diagnosis not present

## 2015-09-15 LAB — CBC WITH DIFFERENTIAL/PLATELET
Basophils Absolute: 0.1 10*3/uL (ref 0.0–0.1)
Basophils Relative: 1 %
EOS PCT: 3 %
Eosinophils Absolute: 0.4 10*3/uL (ref 0.0–0.7)
HEMATOCRIT: 22.5 % — AB (ref 39.0–52.0)
Hemoglobin: 7.3 g/dL — ABNORMAL LOW (ref 13.0–17.0)
LYMPHS ABS: 1.9 10*3/uL (ref 0.7–4.0)
LYMPHS PCT: 14 %
MCH: 29.7 pg (ref 26.0–34.0)
MCHC: 32.4 g/dL (ref 30.0–36.0)
MCV: 91.5 fL (ref 78.0–100.0)
MONO ABS: 1.5 10*3/uL — AB (ref 0.1–1.0)
Monocytes Relative: 11 %
NEUTROS ABS: 9.8 10*3/uL — AB (ref 1.7–7.7)
Neutrophils Relative %: 71 %
PLATELETS: 506 10*3/uL — AB (ref 150–400)
RBC: 2.46 MIL/uL — AB (ref 4.22–5.81)
RDW: 19.8 % — ABNORMAL HIGH (ref 11.5–15.5)
WBC: 13.6 10*3/uL — ABNORMAL HIGH (ref 4.0–10.5)

## 2015-09-15 LAB — RETICULOCYTES
RBC.: 2.46 MIL/uL — ABNORMAL LOW (ref 4.22–5.81)
RETIC COUNT ABSOLUTE: 86.1 10*3/uL (ref 19.0–186.0)
Retic Ct Pct: 3.5 % — ABNORMAL HIGH (ref 0.4–3.1)

## 2015-09-15 MED ORDER — HEPARIN SOD (PORK) LOCK FLUSH 100 UNIT/ML IV SOLN: 250.0000 [IU] | INTRAVENOUS | Status: DC | PRN

## 2015-09-15 MED ORDER — NALOXONE HCL 0.4 MG/ML IJ SOLN: 0.4000 mg | INTRAMUSCULAR | Status: DC | PRN

## 2015-09-15 MED ORDER — SODIUM CHLORIDE 0.9 % IV SOLN
25.0000 mg | INTRAVENOUS | Status: DC | PRN
  Filled 2015-09-15: qty 0.5

## 2015-09-15 MED ORDER — SENNOSIDES-DOCUSATE SODIUM 8.6-50 MG PO TABS: 1.0000 | ORAL_TABLET | Freq: Two times a day (BID) | ORAL | Status: DC

## 2015-09-15 MED ORDER — POLYETHYLENE GLYCOL 3350 17 G PO PACK
17.0000 g | PACK | Freq: Every day | ORAL | Status: DC | PRN
  Filled 2015-09-15: qty 1

## 2015-09-15 MED ORDER — KETOROLAC TROMETHAMINE 30 MG/ML IJ SOLN
30.0000 mg | Freq: Four times a day (QID) | INTRAMUSCULAR | Status: DC
  Administered 2015-09-15: 30 mg via INTRAVENOUS
  Filled 2015-09-15: qty 1

## 2015-09-15 MED ORDER — HEPARIN SOD (PORK) LOCK FLUSH 100 UNIT/ML IV SOLN
500.0000 [IU] | INTRAVENOUS | Status: AC | PRN
  Administered 2015-09-15: 500 [IU]

## 2015-09-15 MED ORDER — SODIUM CHLORIDE 0.9% FLUSH: 10.0000 mL | INTRAVENOUS | Status: DC | PRN

## 2015-09-15 MED ORDER — ONDANSETRON HCL 4 MG/2ML IJ SOLN: 4.0000 mg | Freq: Four times a day (QID) | INTRAMUSCULAR | Status: DC | PRN

## 2015-09-15 MED ORDER — HYDROMORPHONE 1 MG/ML IV SOLN
INTRAVENOUS | Status: DC
  Administered 2015-09-15: 11:00:00 via INTRAVENOUS
  Administered 2015-09-15: 14.5 mg via INTRAVENOUS
  Filled 2015-09-15: qty 25

## 2015-09-15 MED ORDER — DEXTROSE-NACL 5-0.45 % IV SOLN
INTRAVENOUS | Status: DC
  Administered 2015-09-15: 11:00:00 via INTRAVENOUS

## 2015-09-15 MED ORDER — SODIUM CHLORIDE 0.9% FLUSH
9.0000 mL | INTRAVENOUS | Status: DC | PRN
  Administered 2015-09-15: 9 mL via INTRAVENOUS
  Filled 2015-09-15: qty 9

## 2015-09-15 MED ORDER — DIPHENHYDRAMINE HCL 25 MG PO CAPS
25.0000 mg | ORAL_CAPSULE | ORAL | Status: DC | PRN
  Administered 2015-09-15: 25 mg via ORAL
  Filled 2015-09-15: qty 1

## 2015-09-15 NOTE — Progress Notes (Signed)
Pt of Dr.Jegede arrived to [facility] for evaluation and treatment of 9/10 pain. Port-a-cath was accessed, with good blood return noted, fluids infused and PCA started. Pt tolerated procedure well with pain level decreasing to 4/10. Pt is ambulatory, a/o upon discharge. Education provided. Pt discharged home. Synia Douglass, Eustaquio Maize

## 2015-09-15 NOTE — Discharge Summary (Signed)
Physician Discharge Summary  [name] DOB: 27-May-1980 DOA: 09/15/2015  PCP: [name], MD  Admit date: 09/15/2015  Discharge date: 09/15/2015  Time spent: 30 minutes  Discharge Diagnoses:  Active Problems:   Sickle cell anemia with pain [facility])   Discharge Condition: Stable  Diet recommendation: Regular  Filed Weights   09/15/15 0935  Weight: 175 lb (79.4 kg)   History of present illness:  [name] is a 35 y.o. male with history of sickle cell disease, acute chest syndrome, DVT, PE on Xarelto, pulmonary hypertension, cor pulmonale, chronic respiratory failure on home oxygen who presented to the day hospital today with C/O pain to bilateral lower limbs, back and bilateral ribs. Patient rates pain at 9/10 on pain scale. Patient denies chest pain, shortness of breath, difficulty breathing. Patient denies N/V/D and abdominal pain. Patient states he has taken his home pain medication without improvement. He complained of occasional bilateral ankle swelling/peripheral edema, He was recently seen by Dr. Christy Sartorius Test at [facility] and Milda Smart (Ambrisentan) 5 mg was initiated for Pulm HTN. Patient thinks this medication is causing his edema. Although, no edema today.  Hospital Course:  [name] was admitted to the day hospital with sickle cell painful crisis. Patient was treated with weight based IV Dilaudid PCA, IV Toradol as well as IV fluids. [name] showed significant improvement symptomatically, pain improved from 9 to 3 - 4 out of 10 at the time of discharge. [name] will follow-up at the clinic as previously scheduled, continue with home medications as per prior to admission. Patient has appointment with Dr. Christy Sartorius Test coming up, he will discuss his occasional peripheral edema with him and possibility of changing medication,  Discharge Exam: Vitals:   09/15/15 1424 09/15/15 1547  BP: 118/68   Pulse: 90 85  Resp: 13 19  Temp:       General appearance: alert, cooperative and no distress Eyes: conjunctivae/corneas clear. PERRL, EOM's intact. Fundi benign. Neck: no adenopathy, no carotid bruit, no JVD, supple, symmetrical, trachea midline and thyroid not enlarged, symmetric, no tenderness/mass/nodules Back: symmetric, no curvature. ROM normal. No CVA tenderness. Resp: clear to auscultation bilaterally Chest wall: no tenderness Cardio: regular rate and rhythm, S1, S2 normal, no murmur, click, rub or gallop GI: soft, non-tender; bowel sounds normal; no masses, no organomegaly Extremities: extremities normal, atraumatic, no cyanosis or edema Pulses: 2+ and symmetric Skin: Skin color, texture, turgor normal. No rashes or lesions Neurologic: Grossly normal  Discharge Instructions We discussed the need for good hydration, monitoring of hydration status, avoidance of heat, cold, stress, and infection triggers. We discussed the need to be compliant with taking Hydrea. [name] was reminded of the need to seek medical attention of any symptoms of bleeding, anemia, or infection occurs.  Discharge Instructions    Diet - low sodium heart healthy    Complete by:  As directed   Increase activity slowly    Complete by:  As directed     Current Discharge Medication List    CONTINUE these medications which have NOT CHANGED   Details  ambrisentan (LETAIRIS) 5 MG tablet Take 5 mg by mouth at bedtime.     aspirin 81 MG chewable tablet Chew 1 tablet (81 mg total) by mouth daily. Qty: 30 tablet, Refills: 11    Cholecalciferol (VITAMIN D) 2000 units tablet Take 1 tablet (2,000 Units total) by mouth daily. Qty: 90 tablet, Refills: 3   Associated Diagnoses: Vitamin D deficiency    Deferasirox (JADENU)  360 MG TABS Take 1,080 mg by mouth daily.     folic acid (FOLVITE) 1 MG tablet Take 1 tablet (1 mg total) by mouth every morning. Qty: 30 tablet, Refills: 11   Associated Diagnoses: Hb-SS disease without crisis ([place])     gabapentin (NEURONTIN) 300 MG capsule Take 1 capsule (300 mg total) by mouth 3 (three) times daily. Qty: 270 capsule, Refills: 3    HYDROmorphone (DILAUDID) 4 MG tablet Take 1 tablet (4 mg total) by mouth every 4 (four) hours as needed for severe pain. Qty: 90 tablet, Refills: 0   Associated Diagnoses: Sickle-cell disease with pain ([place]); Chronic pain syndrome    hydroxyurea (HYDREA) 500 MG capsule Take 2 capsules (1,000 mg total) by mouth daily. May take with food to minimize GI side effects. Qty: 60 capsule, Refills: 5   Associated Diagnoses: Hb-SS disease without crisis (HCC)    lisinopril (PRINIVIL,ZESTRIL) 5 MG tablet Take 1 tablet (5 mg total) by mouth daily. Qty: 30 tablet, Refills: 1    morphine (MS CONTIN) 30 MG 12 hr tablet Take 1 tablet (30 mg total) by mouth every 12 (twelve) hours. Qty: 60 tablet, Refills: 0   Associated Diagnoses: Chronic pain syndrome    rivaroxaban (XARELTO) 20 MG TABS tablet Take 20 mg by mouth daily with supper.    torsemide (DEMADEX) 20 MG tablet Take 20 mg by mouth daily as needed. For increase in patient weight greater than 3 lbs.    zolpidem (AMBIEN) 10 MG tablet Take 1 tablet (10 mg total) by mouth at bedtime as needed for sleep. Qty: 30 tablet, Refills: 0   Associated Diagnoses: Insomnia    potassium chloride SA (K-DUR,KLOR-CON) 20 MEQ tablet Take 1 tablet (20 mEq total) by mouth every morning. Qty: 30 tablet, Refills: 3   Associated Diagnoses: Pulmonary HTN (HCC)    tadalafil (CIALIS) 10 MG tablet Take 1 tablet (10 mg total) by mouth daily as needed for erectile dysfunction. Qty: 10 tablet, Refills: 0       No Known Allergies   Significant Diagnostic Studies: Dg Chest 2 View  Result Date: 09/01/2015 CLINICAL DATA:  Sickle cell crisis.  Hypoxemia.  Weakness today. EXAM: CHEST  2 VIEW COMPARISON:  08/28/2015 FINDINGS: Left-sided Port-A-Cath tip to the level of the lower superior vena cava -right atrial junction. Heart is markedly  enlarged and stable in configuration. There is mild pulmonary vascular congestion. No overt edema. There is minimal right lower lobe atelectasis. No pleural effusions or consolidations. Surgical clips are noted in the upper abdomen. Note is made of sclerosis within the right humeral head, raising the question of avascular necrosis. Left humeral head is not imaged today. IMPRESSION: Cardiomegaly and vascular congestion.  Right lower lobe atelectasis. Question avascular necrosis of the right humeral head. Electronically Signed   By: Nolon Nations M.D.   On: 09/01/2015 10:44   Dg Chest 2 View  Result Date: 08/28/2015 CLINICAL DATA:  Sickle cell crisis, chest pain, shortness of breath EXAM: CHEST  2 VIEW COMPARISON:  PA and lateral chest x-ray of August 07, 2015 FINDINGS: The lungs are well-expanded. The interstitial markings are increased. Confluent alveolar opacity is present in the left upper lobe. The cardiac silhouette is chronically enlarged. The pulmonary vascularity is chronically engorged centrally. There is no pleural effusion. There is mild multilevel degenerative disc disease of the thoracic spine. The Port-A-Cath appliance catheter tip projects over the distal third of the SVC. IMPRESSION: Chronic cardiomegaly and central pulmonary vascular congestion. Patchy alveolar  opacity in the left upper leg and may reflect pneumonia or confluent edema. Electronically Signed   By: David  Martinique M.D.   On: 08/28/2015 08:34   Signed:  [name] MD, MHA, [place], [place], CPE   09/15/2015, 3:52 PM

## 2015-09-15 NOTE — H&P (Signed)
[facility] History and Physical  TIMO DRUST [id number] DOB: [birth year] DOA: 09/15/2015  PCP: [name], MD   Chief Complaint:  Chief Complaint  Patient presents with  . Sickle Cell Pain Crisis    HPI: [name] is a 35 y.o. male with history of sickle cell disease, acute chest syndrome, DVT, PE on Xarelto, pulmonary hypertension, cor pulmonale, chronic respiratory failure on home oxygen who presented to the day hospital today with C/O pain to bilateral lower limbs, back and bilateral ribs. Patient rates pain at 9/10 on pain scale. Patient denies chest pain, shortness of breath, difficulty breathing. Patient denies N/V/D and abdominal pain. Patient states he has taken his home pain medication without improvement. He complained of occasional bilateral ankle swelling/peripheral edema, He was recently seen by Dr. Christy Sartorius Test at [facility] and Milda Smart (Ambrisentan) 5 mg was initiated for Pulm HTN. Patient thinks this medication is causing his edema. Although, no edema today.  Systemic Review: General: The patient denies anorexia, fever, weight loss Cardiac: Denies chest pain, syncope, palpitations Respiratory: Denies cough, shortness of breath, wheezing GI: Denies severe indigestion/heartburn, abdominal pain, nausea, vomiting, diarrhea and constipation GU: Denies hematuria, incontinence, dysuria  Skin: Denies suspicious skin lesions Neurologic: Denies focal weakness or numbness, change in vision  Past Medical History:  Diagnosis Date  . Acute chest syndrome ([place]) 06/18/2013  . Acute embolism and thrombosis of right internal jugular vein ([place])   . Alcohol consumption of one to four drinks per day   . Avascular necrosis (HCC)    Right Hip  . Blood transfusion   . Chronic anticoagulation   . Demand ischemia ([place]) 01/02/2014  . Former smoker   . Functional asplenia   . Hb-SS disease with crisis ([place])   . History of Clostridium difficile  infection   . History of pulmonary embolus (PE)   . Hypertension   . Hypokalemia   . Leukocytosis    Chronic  . Mood disorder ([place])   . Noncompliance with medication regimen   . Oxygen deficiency   . Pulmonary hypertension ([place])   . Second hand tobacco smoke exposure   . Sickle cell anemia (HCC)   . Sickle-cell crisis with associated acute chest syndrome ([place]) 05/13/2013  . Thrombocytosis (HCC)    Chronic  . Uses marijuana     Past Surgical History:  Procedure Laterality Date  . CHOLECYSTECTOMY     01/2008  . Excision of left periauricular cyst     10/2009  . Excision of right ear lobe cyst with primary closur     11/2007  . Porta cath placement    . Porta cath removal    . PORTACATH PLACEMENT  01/05/2012   Procedure: INSERTION PORT-A-CATH;  Surgeon: Odis Hollingshead, MD;  Location: [place];  Service: General;  Laterality: N/A;  ultrasound guiced port a cath insertion with fluoroscopy  . Right hip replacement     08/2006  . UMBILICAL HERNIA REPAIR     01/2008    No Known Allergies  Family History  Problem Relation Age of Onset  . Sickle cell trait Mother   . Depression Mother   . Diabetes Mother   . Sickle cell trait Father   . Sickle cell trait Brother       Prior to Admission medications   Medication Sig Start Date End Date Taking? Authorizing Provider  ambrisentan (LETAIRIS) 5 MG tablet Take 5 mg by mouth at bedtime.    Yes Historical Provider,  MD  aspirin 81 MG chewable tablet Chew 1 tablet (81 mg total) by mouth daily. 07/23/14  Yes [name], MD  Cholecalciferol (VITAMIN D) 2000 units tablet Take 1 tablet (2,000 Units total) by mouth daily. 02/23/15  Yes Dorena Dew, FNP  Deferasirox (JADENU) 360 MG TABS Take 1,080 mg by mouth daily.    Yes Historical Provider, MD  folic acid (FOLVITE) 1 MG tablet Take 1 tablet (1 mg total) by mouth every morning. 02/23/15  Yes Dorena Dew, FNP  gabapentin (NEURONTIN) 300 MG capsule Take 1 capsule (300 mg  total) by mouth 3 (three) times daily. 06/01/15  Yes Tresa Garter, MD  HYDROmorphone (DILAUDID) 4 MG tablet Take 1 tablet (4 mg total) by mouth every 4 (four) hours as needed for severe pain. 09/04/15  Yes Tresa Garter, MD  hydroxyurea (HYDREA) 500 MG capsule Take 2 capsules (1,000 mg total) by mouth daily. May take with food to minimize GI side effects. Patient taking differently: Take 500 mg by mouth 3 (three) times daily. May take with food to minimize GI side effects. 06/01/15  Yes Tresa Garter, MD  lisinopril (PRINIVIL,ZESTRIL) 5 MG tablet Take 1 tablet (5 mg total) by mouth daily. 08/04/15  Yes [name], MD  morphine (MS CONTIN) 30 MG 12 hr tablet Take 1 tablet (30 mg total) by mouth every 12 (twelve) hours. 09/07/15  Yes Tresa Garter, MD  rivaroxaban (XARELTO) 20 MG TABS tablet Take 20 mg by mouth daily with supper.   Yes Historical Provider, MD  torsemide (DEMADEX) 20 MG tablet Take 20 mg by mouth daily as needed. For increase in patient weight greater than 3 lbs. 07/27/15 07/26/16 Yes Historical Provider, MD  zolpidem (AMBIEN) 10 MG tablet Take 1 tablet (10 mg total) by mouth at bedtime as needed for sleep. 09/07/15  Yes Tresa Garter, MD  potassium chloride SA (K-DUR,KLOR-CON) 20 MEQ tablet Take 1 tablet (20 mEq total) by mouth every morning. Patient not taking: Reported on 08/28/2015 04/13/15   Dorena Dew, FNP  tadalafil (CIALIS) 10 MG tablet Take 1 tablet (10 mg total) by mouth daily as needed for erectile dysfunction. Patient not taking: Reported on 08/28/2015 08/07/15   Tresa Garter, MD     Physical Exam: Vitals:   09/15/15 0935  BP: 106/72  Pulse: 86  Resp: 18  Temp: 98.3 F (36.8 C)  TempSrc: Oral  SpO2: 95%  Weight: 175 lb (79.4 kg)    General: Alert, awake, afebrile, anicteric, not in obvious distress HEENT: Normocephalic and Atraumatic, Mucous membranes pink                PERRLA; EOM intact; No scleral icterus,                  Nares: Patent, Oropharynx: Clear, Fair Dentition                 Neck: FROM, no cervical lymphadenopathy, thyromegaly, carotid bruit or JVD;  CHEST WALL: No tenderness  CHEST: Normal respiration, clear to auscultation bilaterally  HEART: Regular rate and rhythm; no murmurs rubs or gallops  BACK: No kyphosis or scoliosis; no CVA tenderness  ABDOMEN: Positive Bowel Sounds, soft, non-tender; no masses, no organomegaly EXTREMITIES: No cyanosis, clubbing, or edema SKIN:  no rash or ulceration  CNS: Alert and Oriented x 4, Nonfocal exam, CN 2-12 intact  Labs on Admission:  Basic Metabolic Panel: No results for input(s): NA, K, CL, CO2, GLUCOSE,  BUN, CREATININE, CALCIUM, MG, PHOS in the last 168 hours. Liver Function Tests: No results for input(s): AST, ALT, ALKPHOS, BILITOT, PROT, ALBUMIN in the last 168 hours. No results for input(s): LIPASE, AMYLASE in the last 168 hours. No results for input(s): AMMONIA in the last 168 hours. CBC: No results for input(s): WBC, NEUTROABS, HGB, HCT, MCV, PLT in the last 168 hours. Cardiac Enzymes: No results for input(s): CKTOTAL, CKMB, CKMBINDEX, TROPONINI in the last 168 hours.  BNP (last 3 results)  Recent Labs  11/30/14 0910 08/02/15 2307  BNP 639.7* 503.2*    ProBNP (last 3 results) No results for input(s): PROBNP in the last 8760 hours.  CBG: No results for input(s): GLUCAP in the last 168 hours.   Assessment/Plan Active Problems:   Sickle cell anemia with pain ([place])   Admits to the Day Hospital  IVF D5 .45% Saline @ 125 mls/hour  Weight based Dilaudid PCA started within 30 minutes of admission  IV Toradol 30 mg Q 6 H  Monitor vitals very closely, Re-evaluate pain scale every hour  2 L of Oxygen by [place]  Patient will be re-evaluated for pain in the context of function and relationship to baseline as care progresses.  If no significant relieve from pain (remains above 5/10) will transfer patient to inpatient services for  further evaluation and management  Code Status: Full  Family Communication: None  DVT Prophylaxis: Ambulate as tolerated   Time spent: 63 Minutes  Gerald Skillen, MD, MHA, FACP, FAAP, CPE  If 7PM-7AM, please contact night-coverage [email or web address] 09/15/2015, 10:34 AM

## 2015-09-15 NOTE — Telephone Encounter (Signed)
Pt called with complaints of 8/10 pain in rib cage, back. Home meds taken w/o relief. Denies CP, N/V/D, fever, abd pain. Dr.Jegede notified and aware and advises pt to come in for evaluation and treatment. Pt verbalizes understanding and states he will be here in 30 minutes. Farron Watrous, [place]io Maize

## 2015-09-17 ENCOUNTER — Encounter (HOSPITAL_COMMUNITY): Payer: Self-pay | Admitting: Emergency Medicine

## 2015-09-17 DIAGNOSIS — Z79899 Other long term (current) drug therapy: Secondary | ICD-10-CM | POA: Diagnosis not present

## 2015-09-17 DIAGNOSIS — Y939 Activity, unspecified: Secondary | ICD-10-CM | POA: Insufficient documentation

## 2015-09-17 DIAGNOSIS — Z87891 Personal history of nicotine dependence: Secondary | ICD-10-CM | POA: Diagnosis not present

## 2015-09-17 DIAGNOSIS — I10 Essential (primary) hypertension: Secondary | ICD-10-CM | POA: Insufficient documentation

## 2015-09-17 DIAGNOSIS — Y999 Unspecified external cause status: Secondary | ICD-10-CM | POA: Insufficient documentation

## 2015-09-17 DIAGNOSIS — Y929 Unspecified place or not applicable: Secondary | ICD-10-CM | POA: Insufficient documentation

## 2015-09-17 DIAGNOSIS — M545 Low back pain: Secondary | ICD-10-CM | POA: Diagnosis not present

## 2015-09-17 DIAGNOSIS — R079 Chest pain, unspecified: Secondary | ICD-10-CM | POA: Insufficient documentation

## 2015-09-17 DIAGNOSIS — M25551 Pain in right hip: Secondary | ICD-10-CM | POA: Diagnosis not present

## 2015-09-17 DIAGNOSIS — W010XXA Fall on same level from slipping, tripping and stumbling without subsequent striking against object, initial encounter: Secondary | ICD-10-CM | POA: Insufficient documentation

## 2015-09-17 DIAGNOSIS — Z7982 Long term (current) use of aspirin: Secondary | ICD-10-CM | POA: Insufficient documentation

## 2015-09-17 DIAGNOSIS — S3992XA Unspecified injury of lower back, initial encounter: Secondary | ICD-10-CM | POA: Diagnosis not present

## 2015-09-17 DIAGNOSIS — D5700 Hb-SS disease with crisis, unspecified: Principal | ICD-10-CM | POA: Insufficient documentation

## 2015-09-17 DIAGNOSIS — S79911A Unspecified injury of right hip, initial encounter: Secondary | ICD-10-CM | POA: Diagnosis not present
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
## 2015-09-18 ENCOUNTER — Emergency Department (HOSPITAL_COMMUNITY): Payer: Medicare Other

## 2015-09-18 ENCOUNTER — Observation Stay (HOSPITAL_COMMUNITY)
Admission: EM | Admit: 2015-09-18 | Discharge: 2015-09-18 | Disposition: A | Discharge status: Home / Self Care | Payer: Medicare Other | Attending: Internal Medicine | Admitting: Internal Medicine

## 2015-09-18 ENCOUNTER — Encounter (HOSPITAL_COMMUNITY): Payer: Self-pay | Admitting: Internal Medicine

## 2015-09-18 DIAGNOSIS — M25551 Pain in right hip: Secondary | ICD-10-CM | POA: Diagnosis not present

## 2015-09-18 DIAGNOSIS — D638 Anemia in other chronic diseases classified elsewhere: Secondary | ICD-10-CM | POA: Diagnosis not present

## 2015-09-18 DIAGNOSIS — D571 Sickle-cell disease without crisis: Secondary | ICD-10-CM

## 2015-09-18 DIAGNOSIS — S3992XA Unspecified injury of lower back, initial encounter: Secondary | ICD-10-CM | POA: Diagnosis not present

## 2015-09-18 DIAGNOSIS — W19XXXS Unspecified fall, sequela: Secondary | ICD-10-CM

## 2015-09-18 DIAGNOSIS — D5700 Hb-SS disease with crisis, unspecified: Principal | ICD-10-CM

## 2015-09-18 DIAGNOSIS — D6101 Constitutional (pure) red blood cell aplasia: Secondary | ICD-10-CM | POA: Diagnosis not present

## 2015-09-18 DIAGNOSIS — I272 Pulmonary hypertension, unspecified: Secondary | ICD-10-CM | POA: Diagnosis present

## 2015-09-18 DIAGNOSIS — R079 Chest pain, unspecified: Secondary | ICD-10-CM | POA: Diagnosis not present

## 2015-09-18 DIAGNOSIS — M545 Low back pain: Secondary | ICD-10-CM | POA: Diagnosis not present

## 2015-09-18 DIAGNOSIS — S79911A Unspecified injury of right hip, initial encounter: Secondary | ICD-10-CM | POA: Diagnosis not present

## 2015-09-18 DIAGNOSIS — W010XXA Fall on same level from slipping, tripping and stumbling without subsequent striking against object, initial encounter: Secondary | ICD-10-CM

## 2015-09-18 LAB — COMPREHENSIVE METABOLIC PANEL
ALK PHOS: 81 U/L (ref 38–126)
ALK PHOS: 79 U/L (ref 38–126)
ALT: 21 U/L (ref 17–63)
ALT: 20 U/L (ref 17–63)
ANION GAP: 5 (ref 5–15)
AST: 35 U/L (ref 15–41)
AST: 38 U/L (ref 15–41)
Albumin: 4.5 g/dL (ref 3.5–5.0)
Albumin: 4.5 g/dL (ref 3.5–5.0)
Anion gap: 5 (ref 5–15)
BILIRUBIN TOTAL: 3.2 mg/dL — AB (ref 0.3–1.2)
BILIRUBIN TOTAL: 3.1 mg/dL — AB (ref 0.3–1.2)
BUN: 22 mg/dL — AB (ref 6–20)
BUN: 18 mg/dL (ref 6–20)
CALCIUM: 8.7 mg/dL — AB (ref 8.9–10.3)
CALCIUM: 8.8 mg/dL — AB (ref 8.9–10.3)
CHLORIDE: 110 mmol/L (ref 101–111)
CO2: 21 mmol/L — ABNORMAL LOW (ref 22–32)
CO2: 22 mmol/L (ref 22–32)
CREATININE: 0.89 mg/dL (ref 0.61–1.24)
Chloride: 109 mmol/L (ref 101–111)
Creatinine, Ser: 0.91 mg/dL (ref 0.61–1.24)
GFR calc Af Amer: >60 mL/min (ref >60)
GFR calc non Af Amer: >60 mL/min (ref >60)
Glucose, Bld: 100 mg/dL — ABNORMAL HIGH (ref 65–99)
Glucose, Bld: 104 mg/dL — ABNORMAL HIGH (ref 65–99)
Potassium: 4.8 mmol/L (ref 3.5–5.1)
Potassium: 4.8 mmol/L (ref 3.5–5.1)
SODIUM: 136 mmol/L (ref 135–145)
Sodium: 136 mmol/L (ref 135–145)
TOTAL PROTEIN: 7.7 g/dL (ref 6.5–8.1)
Total Protein: 7.5 g/dL (ref 6.5–8.1)

## 2015-09-18 LAB — CBC WITH DIFFERENTIAL/PLATELET
BASOS PCT: 1 %
Basophils Absolute: 0.2 10*3/uL — ABNORMAL HIGH (ref 0.0–0.1)
Basophils Absolute: 0.1 10*3/uL (ref 0.0–0.1)
Basophils Relative: 1 %
EOS ABS: 0.5 10*3/uL (ref 0.0–0.7)
EOS PCT: 3 %
Eosinophils Absolute: 0.5 10*3/uL (ref 0.0–0.7)
Eosinophils Relative: 3 %
HEMATOCRIT: 20.3 % — AB (ref 39.0–52.0)
HEMATOCRIT: 20.3 % — AB (ref 39.0–52.0)
HEMOGLOBIN: 6.7 g/dL — AB (ref 13.0–17.0)
Hemoglobin: 6.7 g/dL — CL (ref 13.0–17.0)
LYMPHS ABS: 1.8 10*3/uL (ref 0.7–4.0)
LYMPHS ABS: 2.1 10*3/uL (ref 0.7–4.0)
Lymphocytes Relative: 11 %
Lymphocytes Relative: 13 %
MCH: 30.3 pg (ref 26.0–34.0)
MCH: 29.9 pg (ref 26.0–34.0)
MCHC: 33 g/dL (ref 30.0–36.0)
MCHC: 33 g/dL (ref 30.0–36.0)
MCV: 91.9 fL (ref 78.0–100.0)
MCV: 90.6 fL (ref 78.0–100.0)
MONO ABS: 1.8 10*3/uL — AB (ref 0.1–1.0)
MONOS PCT: 11 %
MONOS PCT: 12 %
Monocytes Absolute: 1.9 10*3/uL — ABNORMAL HIGH (ref 0.1–1.0)
NEUTROS ABS: 12.4 10*3/uL — AB (ref 1.7–7.7)
NEUTROS PCT: 72 %
Neutro Abs: 12 10*3/uL — ABNORMAL HIGH (ref 1.7–7.7)
Neutrophils Relative %: 74 %
Platelets: 469 10*3/uL — ABNORMAL HIGH (ref 150–400)
Platelets: 456 10*3/uL — ABNORMAL HIGH (ref 150–400)
RBC: 2.21 MIL/uL — ABNORMAL LOW (ref 4.22–5.81)
RBC: 2.24 MIL/uL — ABNORMAL LOW (ref 4.22–5.81)
RDW: 20.1 % — AB (ref 11.5–15.5)
RDW: 20.1 % — ABNORMAL HIGH (ref 11.5–15.5)
WBC: 16.7 10*3/uL — ABNORMAL HIGH (ref 4.0–10.5)
WBC: 16.6 10*3/uL — ABNORMAL HIGH (ref 4.0–10.5)

## 2015-09-18 LAB — RETICULOCYTES
RBC.: 2.21 MIL/uL — ABNORMAL LOW (ref 4.22–5.81)
RBC.: 2.23 MIL/uL — AB (ref 4.22–5.81)
RETIC COUNT ABSOLUTE: 35.4 10*3/uL (ref 19.0–186.0)
RETIC CT PCT: 1.3 % (ref 0.4–3.1)
Retic Count, Absolute: 29 10*3/uL (ref 19.0–186.0)
Retic Ct Pct: 1.6 % (ref 0.4–3.1)

## 2015-09-18 LAB — LACTATE DEHYDROGENASE: LDH: 399 U/L — ABNORMAL HIGH (ref 98–192)

## 2015-09-18 LAB — I-STAT TROPONIN, ED: TROPONIN I, POC: 0 ng/mL (ref 0.00–0.08)

## 2015-09-18 IMAGING — CR DG SHOULDER 2+V*L*
3 series · 3 of 3 positions shown · non-contrast
Comparison: 08/17/2011

CLINICAL DATA: Left shoulder pain, history sickle cell disease

EXAM:
LEFT SHOULDER - 2+ VIEW

[w shoulder ap internal left *]
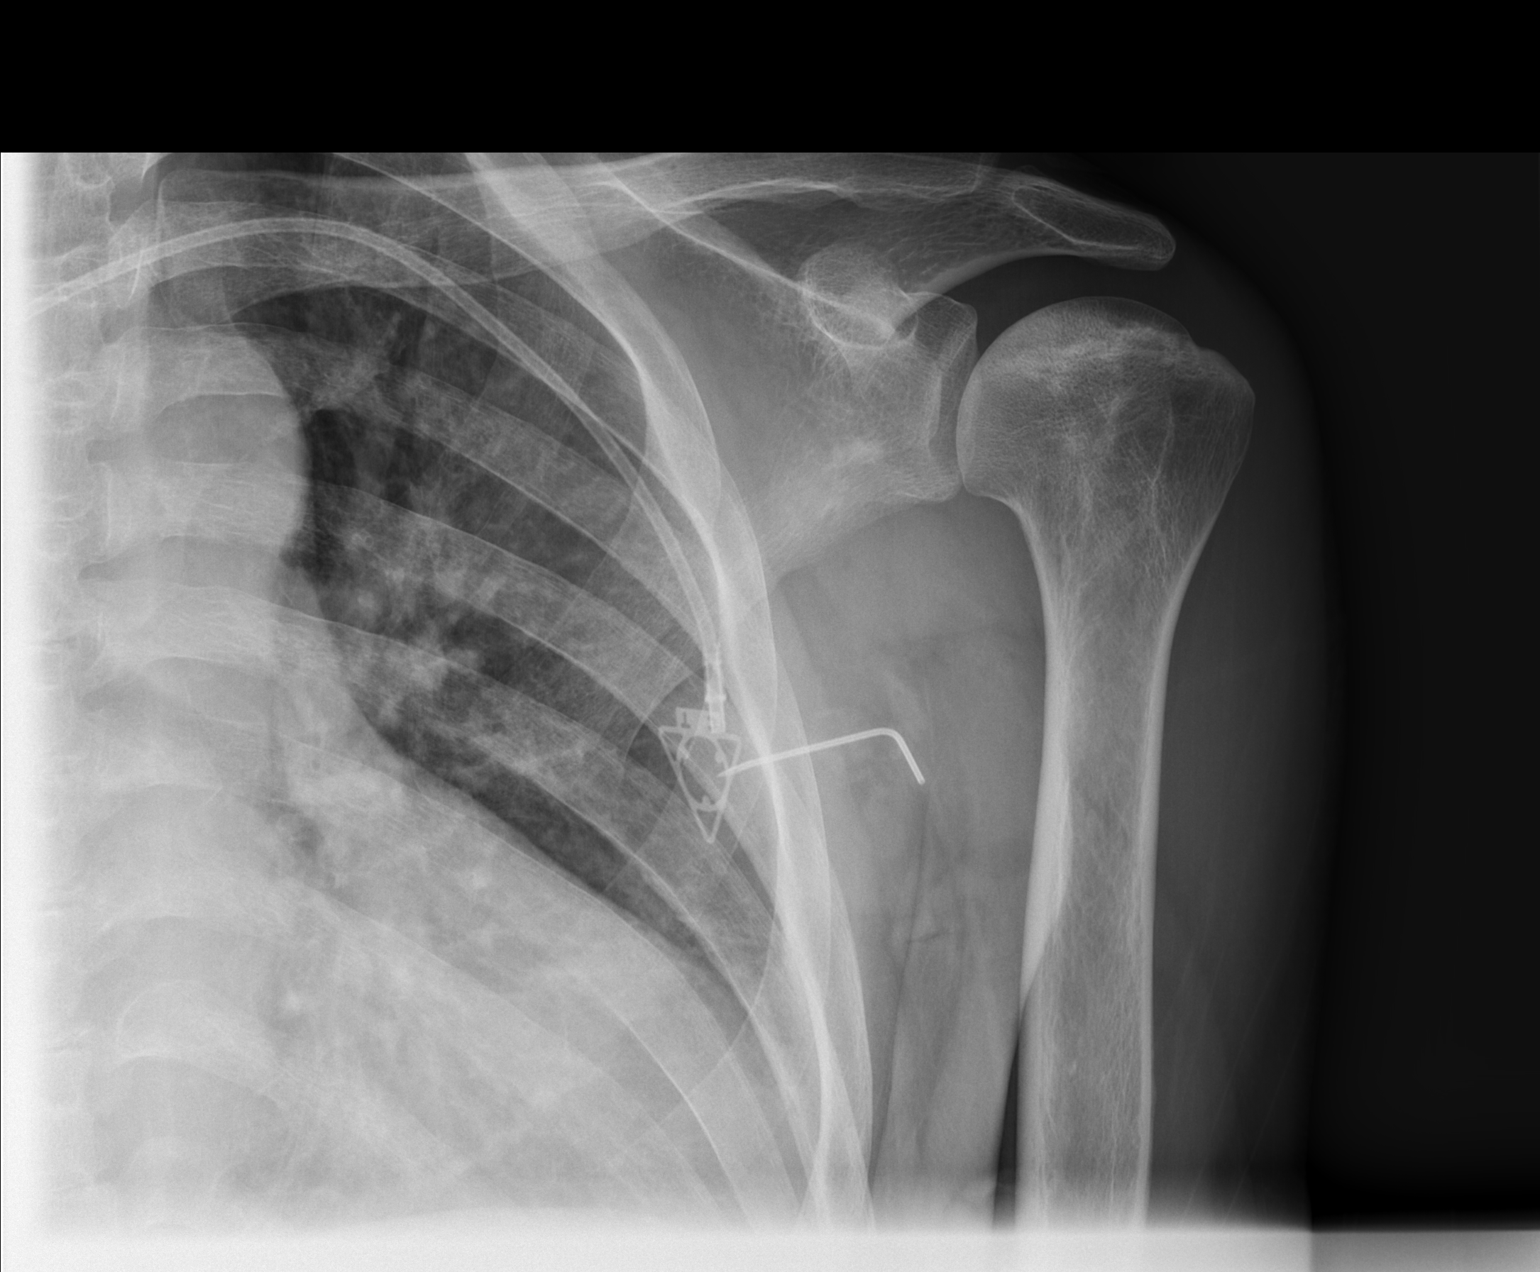

[w shoulder ap external left]
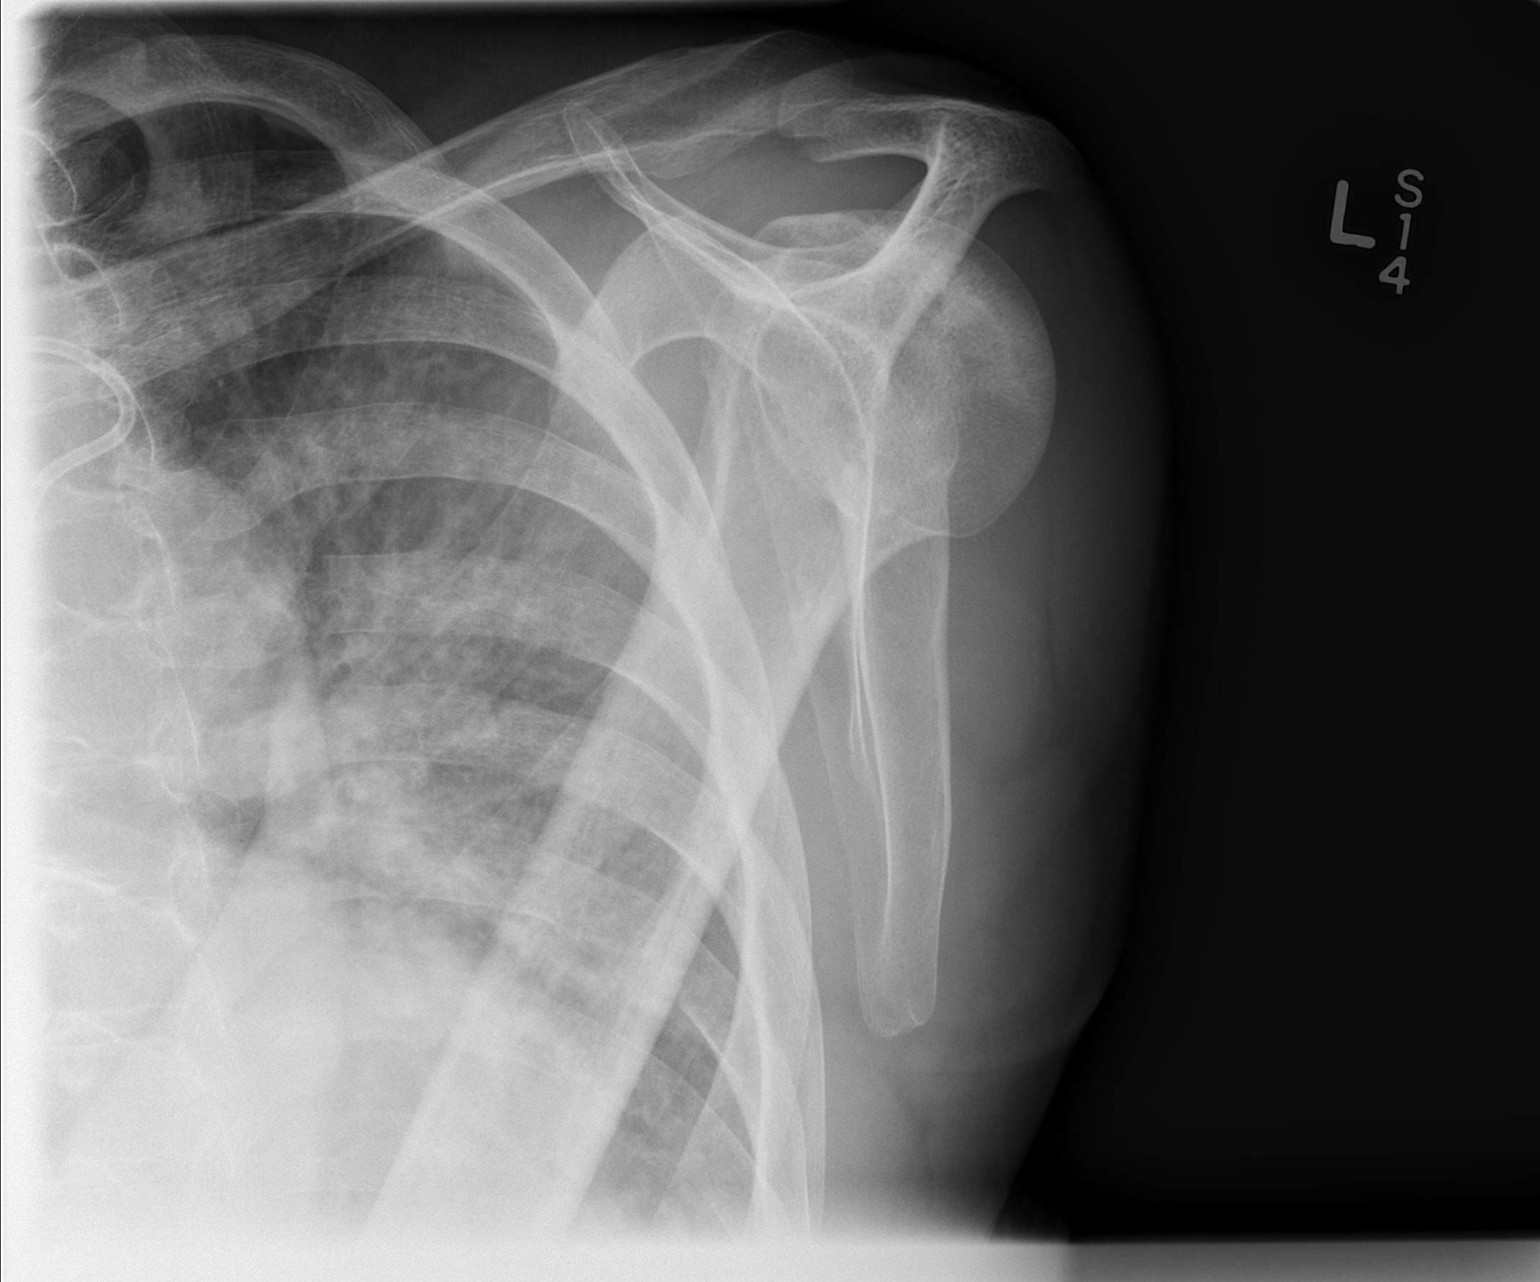

[x shoulder axillary left]
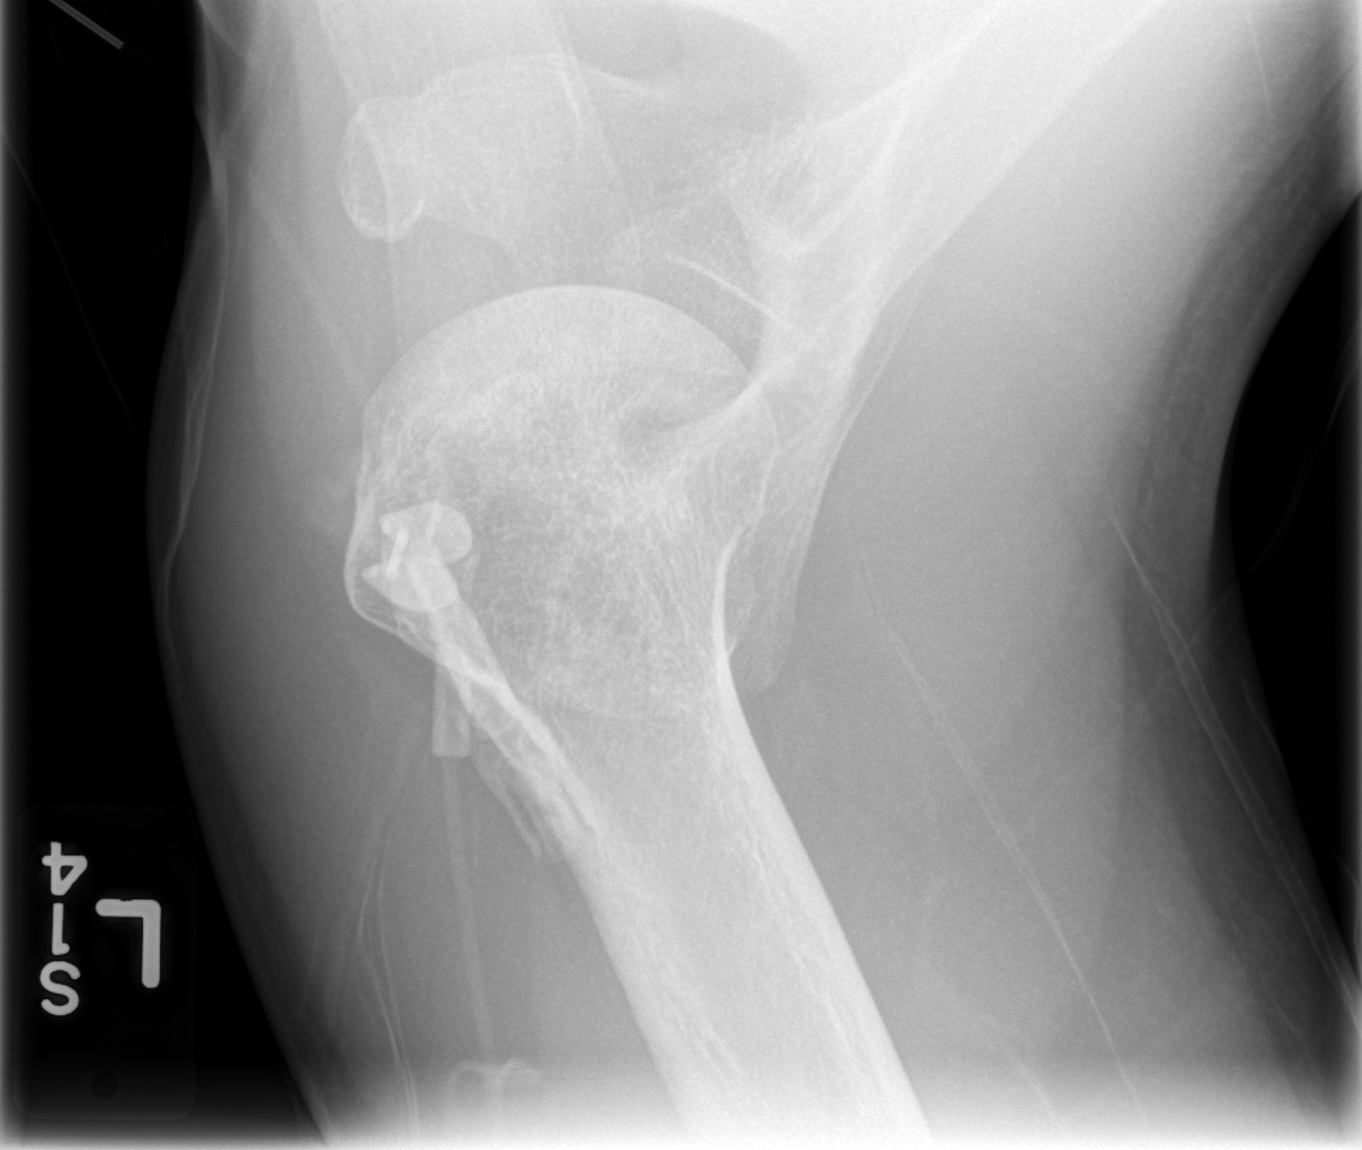

[3 of 3 positions shown; findings below may reference images not displayed]

FINDINGS: AC joint alignment normal.

Sclerosis identified within the left humeral head compatible with
avascular necrosis.

No left humeral head fracture or collapse identified.

Glenohumeral joint alignment normal.

No acute fracture, dislocation or bone destruction.

Adjacent left ribs appear intact.
IMPRESSION: Avascular necrosis changes of the left humeral head, unchanged from
previous exam.

No new osseous abnormalities.

## 2015-09-18 MED ORDER — HYDROMORPHONE HCL 2 MG/ML IJ SOLN
0.0250 mg/kg | INTRAMUSCULAR | Status: AC
  Administered 2015-09-18: 2 mg via INTRAVENOUS
  Filled 2015-09-18: qty 1

## 2015-09-18 MED ORDER — HYDROMORPHONE HCL 2 MG/ML IJ SOLN: 0.0250 mg/kg | INTRAMUSCULAR | Status: AC

## 2015-09-18 MED ORDER — KETOROLAC TROMETHAMINE 15 MG/ML IJ SOLN
15.0000 mg | Freq: Four times a day (QID) | INTRAMUSCULAR | Status: DC
  Administered 2015-09-18: 15 mg via INTRAVENOUS
  Filled 2015-09-18: qty 1

## 2015-09-18 MED ORDER — NALOXONE HCL 0.4 MG/ML IJ SOLN: 0.4000 mg | INTRAMUSCULAR | Status: DC | PRN

## 2015-09-18 MED ORDER — SODIUM CHLORIDE 0.45 % IV SOLN
INTRAVENOUS | Status: DC
  Administered 2015-09-18 (×2): via INTRAVENOUS

## 2015-09-18 MED ORDER — HYDROMORPHONE HCL 2 MG/ML IJ SOLN
0.0313 mg/kg | INTRAMUSCULAR | Status: AC
  Administered 2015-09-18: 2.5 mg via INTRAVENOUS
  Filled 2015-09-18: qty 2

## 2015-09-18 MED ORDER — DIPHENHYDRAMINE HCL 25 MG PO CAPS
25.0000 mg | ORAL_CAPSULE | ORAL | Status: DC | PRN
  Administered 2015-09-18: 25 mg via ORAL
  Filled 2015-09-18: qty 1

## 2015-09-18 MED ORDER — ASPIRIN 81 MG PO CHEW
81.0000 mg | CHEWABLE_TABLET | Freq: Every day | ORAL | Status: DC
  Administered 2015-09-18: 81 mg via ORAL
  Filled 2015-09-18: qty 1

## 2015-09-18 MED ORDER — HYDROMORPHONE HCL 2 MG/ML IJ SOLN: 0.0313 mg/kg | INTRAMUSCULAR | Status: AC

## 2015-09-18 MED ORDER — MORPHINE SULFATE ER 30 MG PO TBCR
30.0000 mg | EXTENDED_RELEASE_TABLET | Freq: Two times a day (BID) | ORAL | Status: DC
  Administered 2015-09-18: 30 mg via ORAL
  Filled 2015-09-18: qty 1

## 2015-09-18 MED ORDER — SODIUM CHLORIDE 0.9% FLUSH: 9.0000 mL | INTRAVENOUS | Status: DC | PRN

## 2015-09-18 MED ORDER — HEPARIN SOD (PORK) LOCK FLUSH 100 UNIT/ML IV SOLN
500.0000 [IU] | Freq: Once | INTRAVENOUS | Status: DC
  Filled 2015-09-18: qty 5

## 2015-09-18 MED ORDER — HYDROMORPHONE 1 MG/ML IV SOLN
INTRAVENOUS | Status: DC
  Administered 2015-09-18: 1.58 mg via INTRAVENOUS
  Administered 2015-09-18: 10.57 mg via INTRAVENOUS
  Filled 2015-09-18: qty 25

## 2015-09-18 MED ORDER — SENNOSIDES-DOCUSATE SODIUM 8.6-50 MG PO TABS
1.0000 | ORAL_TABLET | Freq: Two times a day (BID) | ORAL | Status: DC
Start: 2015-09-18 — End: 2015-09-18
  Administered 2015-09-18: 1 via ORAL
  Filled 2015-09-18: qty 1

## 2015-09-18 MED ORDER — ONDANSETRON HCL 4 MG/2ML IJ SOLN: 4.0000 mg | Freq: Three times a day (TID) | INTRAMUSCULAR | Status: DC | PRN

## 2015-09-18 MED ORDER — GABAPENTIN 300 MG PO CAPS
300.0000 mg | ORAL_CAPSULE | Freq: Three times a day (TID) | ORAL | Status: DC
  Administered 2015-09-18: 300 mg via ORAL
  Filled 2015-09-18: qty 1

## 2015-09-18 MED ORDER — RIVAROXABAN 20 MG PO TABS: 20.0000 mg | ORAL_TABLET | Freq: Every day | ORAL | Status: DC

## 2015-09-18 MED ORDER — TADALAFIL 10 MG PO TABS: 10.0000 mg | ORAL_TABLET | Freq: Every day | ORAL | Status: DC | PRN

## 2015-09-18 MED ORDER — LISINOPRIL 10 MG PO TABS
5.0000 mg | ORAL_TABLET | Freq: Every day | ORAL | Status: DC
  Administered 2015-09-18: 5 mg via ORAL
  Filled 2015-09-18 (×2): qty 1

## 2015-09-18 MED ORDER — POLYETHYLENE GLYCOL 3350 17 G PO PACK
17.0000 g | PACK | Freq: Every day | ORAL | Status: DC | PRN
  Filled 2015-09-18: qty 1

## 2015-09-18 MED ORDER — DEFERASIROX 360 MG PO TABS: 1080.0000 mg | ORAL_TABLET | Freq: Every day | ORAL | Status: DC

## 2015-09-18 MED ORDER — FOLIC ACID 1 MG PO TABS
1.0000 mg | ORAL_TABLET | Freq: Every morning | ORAL | Status: DC
  Administered 2015-09-18: 1 mg via ORAL
  Filled 2015-09-18: qty 1

## 2015-09-18 MED ORDER — ONDANSETRON HCL 4 MG/2ML IJ SOLN: 4.0000 mg | Freq: Four times a day (QID) | INTRAMUSCULAR | Status: DC | PRN

## 2015-09-18 MED ORDER — SODIUM CHLORIDE 0.45 % IV SOLN
INTRAVENOUS | Status: DC
  Administered 2015-09-18 (×2): via INTRAVENOUS

## 2015-09-18 MED ORDER — ZOLPIDEM TARTRATE 10 MG PO TABS: 10.0000 mg | ORAL_TABLET | Freq: Every evening | ORAL | Status: DC | PRN

## 2015-09-18 MED ORDER — AMBRISENTAN 5 MG PO TABS: 5.0000 mg | ORAL_TABLET | Freq: Every day | ORAL | Status: DC

## 2015-09-18 NOTE — ED Notes (Signed)
Attempted to call receiving RN in order to transport pt to  Unit. RN unavailable.

## 2015-09-18 NOTE — Discharge Summary (Signed)
[facility] DOCHERTY MRN: [id number] DOB/AGE: 36-02-1980 36 y.o.  Admit date: 09/18/2015 Discharge date: 09/18/2015  Primary Care Physician:  [name], MD   Discharge Diagnoses:   Patient Active Problem List   Diagnosis Date Noted  . Cor pulmonale, chronic ([place]) 08/25/2014    Priority: High  . Pulmonary HTN ([place]) 06/18/2013    Priority: High  . Functional asplenia     Priority: High  . Anemia of chronic disease 06/25/2014    Priority: Medium  . Hb-SS disease with crisis ([place]) 01/22/2014    Priority: Medium  . Chronic anticoagulation 08/22/2013    Priority: Medium  . Avascular necrosis ([place])     Priority: Low  . Sickle cell pain crisis ([place]) 09/18/2015  . Sickle cell anemia with crisis ([place]) 09/04/2015  . Shortness of breath   . Pulmonary embolism without acute cor pulmonale ([place])   . Sickle cell anemia with pain ([place]) 05/07/2015  . Hypoxia   . Anemia 01/31/2015  . Symptomatic anemia   . Hb-SS disease without crisis ([place]) 10/07/2014  . PVC's (premature ventricular contractions) 09/10/2014  . Abnormal EKG 09/10/2014  . Chronic atrial fibrillation ([place]) 09/09/2014  . Sickle cell anemia ([place]) 06/25/2014  . PAH (pulmonary artery hypertension) ([place]) 03/18/2014  . Paralytic strabismus, external ophthalmoplegia   . Chronic pain syndrome 12/12/2013  . Essential hypertension 08/22/2013  . Vitamin D deficiency 02/13/2013  . Hx of pulmonary embolus 06/29/2012  . Hemochromatosis 12/14/2011    DISCHARGE MEDICATION:   Medication List    STOP taking these medications   hydroxyurea 500 MG capsule Commonly known as:  HYDREA   tadalafil 10 MG tablet Commonly known as:  CIALIS     TAKE these medications   ambrisentan 5 MG tablet Commonly known as:  LETAIRIS Take 5 mg by mouth at bedtime.   aspirin 81 MG chewable tablet Chew 1 tablet (81 mg total) by mouth daily.   folic acid 1 MG tablet Commonly known as:  FOLVITE Take 1 tablet (1 mg total) by mouth every  morning.   gabapentin 300 MG capsule Commonly known as:  NEURONTIN Take 1 capsule (300 mg total) by mouth 3 (three) times daily.   HYDROmorphone 4 MG tablet Commonly known as:  DILAUDID Take 1 tablet (4 mg total) by mouth every 4 (four) hours as needed for severe pain.   JADENU 360 MG Tabs Generic drug:  Deferasirox Take 1,080 mg by mouth daily.   lisinopril 5 MG tablet Commonly known as:  PRINIVIL,ZESTRIL Take 1 tablet (5 mg total) by mouth daily.   morphine 30 MG 12 hr tablet Commonly known as:  MS CONTIN Take 1 tablet (30 mg total) by mouth every 12 (twelve) hours.   potassium chloride SA 20 MEQ tablet Commonly known as:  K-DUR,KLOR-CON Take 1 tablet (20 mEq total) by mouth every morning.   rivaroxaban 20 MG Tabs tablet Commonly known as:  XARELTO Take 20 mg by mouth daily with supper.   torsemide 20 MG tablet Commonly known as:  DEMADEX Take 20 mg by mouth daily as needed. For increase in patient weight greater than 3 lbs.   Vitamin D 2000 units tablet Take 1 tablet (2,000 Units total) by mouth daily.   zolpidem 10 MG tablet Commonly known as:  AMBIEN Take 1 tablet (10 mg total) by mouth at bedtime as needed for sleep.         Consults:    SIGNIFICANT DIAGNOSTIC STUDIES:  Dg Chest 2 View  Result Date:  09/18/2015 CLINICAL DATA:  Status post fall, with upper anterior chest pain. Initial encounter. EXAM: CHEST  2 VIEW COMPARISON:  Chest radiograph performed 09/01/2015 FINDINGS: The lungs are well-aerated. Mild vascular congestion is noted, with mild bibasilar atelectasis. There is no evidence of pleural effusion or pneumothorax. The heart is enlarged. A left-sided chest port ends about the cavoatrial junction. No acute osseous abnormalities are seen. IMPRESSION: Mild vascular congestion and cardiomegaly noted. Mild bibasilar atelectasis seen. No displaced rib fractures identified. Electronically Signed   By: Garald Balding M.D.   On: 09/18/2015 02:17   Dg Chest  2 View  Result Date: 09/01/2015 CLINICAL DATA:  Sickle cell crisis.  Hypoxemia.  Weakness today. EXAM: CHEST  2 VIEW COMPARISON:  08/28/2015 FINDINGS: Left-sided Port-A-Cath tip to the level of the lower superior vena cava -right atrial junction. Heart is markedly enlarged and stable in configuration. There is mild pulmonary vascular congestion. No overt edema. There is minimal right lower lobe atelectasis. No pleural effusions or consolidations. Surgical clips are noted in the upper abdomen. Note is made of sclerosis within the right humeral head, raising the question of avascular necrosis. Left humeral head is not imaged today. IMPRESSION: Cardiomegaly and vascular congestion.  Right lower lobe atelectasis. Question avascular necrosis of the right humeral head. Electronically Signed   By: Nolon Nations M.D.   On: 09/01/2015 10:44   Dg Chest 2 View  Result Date: 08/28/2015 CLINICAL DATA:  Sickle cell crisis, chest pain, shortness of breath EXAM: CHEST  2 VIEW COMPARISON:  PA and lateral chest x-ray of August 07, 2015 FINDINGS: The lungs are well-expanded. The interstitial markings are increased. Confluent alveolar opacity is present in the left upper lobe. The cardiac silhouette is chronically enlarged. The pulmonary vascularity is chronically engorged centrally. There is no pleural effusion. There is mild multilevel degenerative disc disease of the thoracic spine. The Port-A-Cath appliance catheter tip projects over the distal third of the SVC. IMPRESSION: Chronic cardiomegaly and central pulmonary vascular congestion. Patchy alveolar opacity in the left upper leg and may reflect pneumonia or confluent edema. Electronically Signed   By: David  Martinique M.D.   On: 08/28/2015 08:34  Dg Lumbar Spine Complete  Result Date: 09/18/2015 CLINICAL DATA:  Status post fall, with lower back pain radiating to both hips. Initial encounter. EXAM: LUMBAR SPINE - COMPLETE 4+ VIEW COMPARISON:  CT of the abdomen and pelvis  from 06/03/2015 FINDINGS: There is no evidence of fracture or subluxation. Vertebral bodies demonstrate normal height and alignment. Intervertebral disc spaces are preserved. The visualized neural foramina are grossly unremarkable in appearance. A right hip arthroplasty is only partially imaged but appears grossly unremarkable. The visualized bowel gas pattern is unremarkable in appearance; air and stool are noted within the colon. The sacroiliac joints are within normal limits. Clips are noted within the right upper quadrant, reflecting prior cholecystectomy. IMPRESSION: No evidence of fracture or subluxation along the lumbar spine. Electronically Signed   By: Garald Balding M.D.   On: 09/18/2015 02:19   Dg Hip Unilat W Or Wo Pelvis 2-3 Views Right  Result Date: 09/18/2015 CLINICAL DATA:  Status post fall, landing on right side, with right hip pain. Initial encounter. EXAM: DG HIP (WITH OR WITHOUT PELVIS) 2-3V RIGHT COMPARISON:  None. FINDINGS: There is no evidence of fracture or dislocation. The patient's right hip arthroplasty is grossly unremarkable in appearance, without evidence of loosening. Underlying screws are noted along the right acetabulum. Both femoral heads are seated normally within their respective  acetabula. The proximal right femur appears intact. No significant degenerative change is appreciated. The sacroiliac joints are unremarkable in appearance. The visualized bowel gas pattern is grossly unremarkable in appearance. IMPRESSION: No evidence of fracture or dislocation. Right hip arthroplasty is grossly intact, without evidence of loosening. Electronically Signed   By: Garald Balding M.D.   On: 09/18/2015 02:16      No results found for this or any previous visit (from the past 240 hour(s)).  BRIEF ADMITTING H & P: [name] is a 35 y.o. male with sickle cell anemia, pulmonary hypertension, history of DVT and PE presents to the ER because of right hip pain and bilateral rib  pain. Patient states 2 days ago he had a fall since then his right hip has been hurting. He also has been having increasing bilateral rib pain typical of his sickle cell anemia pain crisis. X-rays of the right hip does not show any acute fractures. Chest x-ray shows congestion. Patient is not in any obvious respiratory distress. Patient is being admitted for pain control for sickle cell anemia pain crisis. Patient's reticulocyte count is not elevated.    Hospital Course:  Present on Admission: According to patient, he came to the ED due to a fall yesterday while playing with his nephew. He reports that the pain was excruciating at the right hip and he was concerned about a fracture or dislocation. He reports that the pain in his ribs is no worse than his usual chronic pain. He was managed with Dilaudid via PCA for a presumed sickle cell crisis. X-rays of the hips and chest show no evidence of fracture or dislocation. He will be discharged home on his usual pain regimen of MS Contin and Dilaudid.  With regard to the hip, he has a follow- up appointment with Dr. Durward Fortes on Tuesday 09/22/2015 at 2:00pm.  Pt was also noted to have a Hb of 6.7 g/dl which is at his baseline,. However what was concerning to the admitting Physician was the fact that hs reticulocytes were low with regard to his Hb levels. His Hb has remained stable and his Hydrea has been held. He is to follow up with Dr. Doreene Burke to have labs drawn on Monday to include CBC with diff and reticulocyte count,. Also a Parvo-B DNA has been sent to be followed up on Monday. Clinically the patient is stable and wants to go home. I have given strict instructions to return to the ED if he develops a fever, rash, fever HA, visual changes or weakness.    Disposition and Follow-up: Pt is discharged home in stable condition and [facility] labs and with Orthopedic Surgery on Tuesday 8/232/2017.    DISCHARGE EXAM:   General: Alert, awake, oriented x3, in mild distress secondary to pain in hip.  Vital Signs: BP  128/94, HR 76, T 98.2 F (36.8 C), temperature source Oral, RR 16, height 6' (1.829 m), weight 79.4 kg (175 lb), SpO2 96 % on RA. HEENT: [place]/AT PEERL, EOMI, anicteric Neck: Trachea midline, no masses, no thyromegal,y no JVD, no carotid bruit OROPHARYNX: Moist, No exudate/ erythema/lesions.  Heart: Regular rate and rhythm, without murmurs, rubs, gallops or S3. PMI non-displaced. Exam reveals no decreased pulses. Pulmonary/Chest: Normal effort. Breath sounds normal. No. Apnea. Clear to auscultation,no stridor,  no wheezing and no rhonchi noted. No respiratory distress and no tenderness noted. Abdomen: Soft, nontender, nondistended, normal bowel sounds, no masses no hepatosplenomegaly noted.  No fluid wave and no ascites. There is no guarding or rebound. Neuro: Alert and oriented to person, place and time. Normal motor skills, Displays no atrophy or tremors and exhibits normal muscle tone.  No focal neurological deficits noted cranial nerves II through XII grossly intact. No sensory deficit noted. Strength at baseline in bilateral upper and lower extremities. Gait antalgic due to pain. Musculoskeletal: No warm swelling or erythema around joints, no spinal tenderness noted. Psychiatric: Patient alert and oriented x3, good insight and cognition, good recent to remote recall. Mood,affect and judgement normal Lymph node survey: No cervical axillary or inguinal lymphadenopathy noted. Skin: Skin is warm and dry. No bruising, no ecchymosis and no rash noted. Pt is not diaphoretic. No erythema. No pallor      Recent Labs  09/18/15 0239 09/18/15 0629  NA 136 136  K 4.8 4.8  CL 110 109  CO2 21* 22  GLUCOSE 100* 104*  BUN 22* 18  CREATININE 0.89 0.91  CALCIUM 8.7* 8.8*    Recent Labs  09/18/15 0239 09/18/15 0629  AST 35 38  ALT 21 20  ALKPHOS 81 79  BILITOT 3.2* 3.1*  PROT 7.5 7.7  ALBUMIN  4.5 4.5   No results for input(s): LIPASE, AMYLASE in the last 72 hours.  Recent Labs  09/18/15 0239 09/18/15 0629  WBC 16.7* 16.6*  NEUTROABS 12.4* 12.0*  HGB 6.7* 6.7*  HCT 20.3* 20.3*  MCV 91.9 90.6  PLT 469* 456*     Total time spent including face to face and decision making was greater than 30 minutes  Signed: [name],[name] 09/18/2015, 2:13 PM

## 2015-09-18 NOTE — ED Notes (Signed)
EKG given to EDP,Glick,MD., for review. 

## 2015-09-18 NOTE — ED Provider Notes (Signed)
[facility] DEPT Provider Note   CSN: [id number] Arrival date & time: 09/17/15  2139  By signing my name below, I, Dora Sims, attest that this documentation has been prepared under the direction and in the presence of [employer], PA-C. Electronically Signed: Dora Sims, Scribe. 09/18/2015. 1:53 AM.  History   Chief Complaint Chief Complaint  Patient presents with  . Fall    The history is provided by the patient and medical records. No language interpreter was used.     HPI Comments: [name] is a 36 y.o. male with PMHx of sickle cell anemia and chronic pain syndrome who presents to the Emergency Department complaining of sudden onset, constant, severe, right hip pain s/p falling on a hardwood surface yesterday afternoon. Pt reports he landed on his right hip. He notes pain exacerbation with ambulation, right hip movement, and weight bearing and states he has been ambulating with a limp due to pain. He reports a h/o hip replacement surgery x2, one of which when he was 35 years old and the other when he was 35 years old. He notes that his right ribs began hurting yesterday morning and have hurt more since the fall. Pt states his right rib pain feels like a sickle cell crisis. Pt has taken MS Contin with some relief of his rib pain. Pt has been hospitalized for sickle cell in the past and was most recently hospitalized last month. He denies syncope, SOB, or any other associated symptoms.  Past Medical History:  Diagnosis Date  . Acute chest syndrome ([place]) 06/18/2013  . Acute embolism and thrombosis of right internal jugular vein ([place])   . Alcohol consumption of one to four drinks per day   . Avascular necrosis (HCC)    Right Hip  . Blood transfusion   . Chronic anticoagulation   . Demand ischemia ([place]) 01/02/2014  . Former smoker   . Functional asplenia   . Hb-SS disease with crisis ([place])   . History of Clostridium difficile infection   . History of  pulmonary embolus (PE)   . Hypertension   . Hypokalemia   . Leukocytosis    Chronic  . Mood disorder ([place])   . Noncompliance with medication regimen   . Oxygen deficiency   . Pulmonary hypertension ([place])   . Second hand tobacco smoke exposure   . Sickle cell anemia (HCC)   . Sickle-cell crisis with associated acute chest syndrome ([place]) 05/13/2013  . Thrombocytosis (HCC)    Chronic  . Uses marijuana     Patient Active Problem List   Diagnosis Date Noted  . Sickle cell pain crisis ([place]) 09/18/2015  . Sickle cell anemia with crisis ([place]) 09/04/2015  . Shortness of breath   . Pulmonary embolism without acute cor pulmonale ([place])   . Sickle cell anemia with pain ([place]) 05/07/2015  . Hypoxia   . Anemia 01/31/2015  . Symptomatic anemia   . Hb-SS disease without crisis ([place]) 10/07/2014  . PVC's (premature ventricular contractions) 09/10/2014  . Abnormal EKG 09/10/2014  . Chronic atrial fibrillation ([place]) 09/09/2014  . Cor pulmonale, chronic ([place]) 08/25/2014  . Sickle cell anemia ([place]) 06/25/2014  . Anemia of chronic disease 06/25/2014  . PAH (pulmonary artery hypertension) ([place]) 03/18/2014  . Hb-SS disease with crisis ([place]) 01/22/2014  . Paralytic strabismus, external ophthalmoplegia   . Chronic pain syndrome 12/12/2013  . Chronic anticoagulation 08/22/2013  . Essential hypertension 08/22/2013  . Pulmonary HTN ([place]) 06/18/2013  . Functional asplenia   .  Vitamin D deficiency 02/13/2013  . Hx of pulmonary embolus 06/29/2012  . Hemochromatosis 12/14/2011  . Avascular necrosis [facility])     Past Surgical History:  Procedure Laterality Date  . CHOLECYSTECTOMY     01/2008  . Excision of left periauricular cyst     10/2009  . Excision of right ear lobe cyst with primary closur     11/2007  . Porta cath placement    . Porta cath removal    . PORTACATH PLACEMENT  01/05/2012   Procedure: INSERTION PORT-A-CATH;  Surgeon: Odis Hollingshead, MD;  Location: [facility];  Service: General;   Laterality: N/A;  ultrasound guiced port a cath insertion with fluoroscopy  . Right hip replacement     08/2006  . UMBILICAL HERNIA REPAIR     01/2008     Home Medications    Prior to Admission medications   Medication Sig Start Date End Date Taking? Authorizing Provider  ambrisentan (LETAIRIS) 5 MG tablet Take 5 mg by mouth at bedtime.    Yes Historical Provider, MD  aspirin 81 MG chewable tablet Chew 1 tablet (81 mg total) by mouth daily. 07/23/14  Yes [name], MD  Cholecalciferol (VITAMIN D) 2000 units tablet Take 1 tablet (2,000 Units total) by mouth daily. 02/23/15  Yes Dorena Dew, FNP  Deferasirox (JADENU) 360 MG TABS Take 1,080 mg by mouth daily.    Yes Historical Provider, MD  folic acid (FOLVITE) 1 MG tablet Take 1 tablet (1 mg total) by mouth every morning. 02/23/15  Yes Dorena Dew, FNP  gabapentin (NEURONTIN) 300 MG capsule Take 1 capsule (300 mg total) by mouth 3 (three) times daily. 06/01/15  Yes Tresa Garter, MD  HYDROmorphone (DILAUDID) 4 MG tablet Take 1 tablet (4 mg total) by mouth every 4 (four) hours as needed for severe pain. 09/04/15  Yes Tresa Garter, MD  hydroxyurea (HYDREA) 500 MG capsule Take 2 capsules (1,000 mg total) by mouth daily. May take with food to minimize GI side effects. Patient taking differently: Take 500 mg by mouth 3 (three) times daily. May take with food to minimize GI side effects. 06/01/15  Yes Tresa Garter, MD  lisinopril (PRINIVIL,ZESTRIL) 5 MG tablet Take 1 tablet (5 mg total) by mouth daily. 08/04/15  Yes [name], MD  morphine (MS CONTIN) 30 MG 12 hr tablet Take 1 tablet (30 mg total) by mouth every 12 (twelve) hours. 09/07/15  Yes Tresa Garter, MD  rivaroxaban (XARELTO) 20 MG TABS tablet Take 20 mg by mouth daily with supper.   Yes Historical Provider, MD  torsemide (DEMADEX) 20 MG tablet Take 20 mg by mouth daily as needed. For increase in patient weight greater than 3 lbs. 07/27/15 07/26/16 Yes  Historical Provider, MD  zolpidem (AMBIEN) 10 MG tablet Take 1 tablet (10 mg total) by mouth at bedtime as needed for sleep. 09/07/15  Yes Tresa Garter, MD  potassium chloride SA (K-DUR,KLOR-CON) 20 MEQ tablet Take 1 tablet (20 mEq total) by mouth every morning. Patient not taking: Reported on 08/28/2015 04/13/15   Dorena Dew, FNP  tadalafil (CIALIS) 10 MG tablet Take 1 tablet (10 mg total) by mouth daily as needed for erectile dysfunction. Patient not taking: Reported on 08/28/2015 08/07/15   Tresa Garter, MD    Family History Family History  Problem Relation Age of Onset  . Sickle cell trait Mother   . Depression Mother   . Diabetes Mother   .  Sickle cell trait Father   . Sickle cell trait Brother     Social History Social History  Substance Use Topics  . Smoking status: Former Smoker    Packs/day: 0.50    Years: 10.00    Types: Cigarettes    Quit date: 05/29/2011  . Smokeless tobacco: Never Used  . Alcohol use No     Allergies   Review of patient's allergies indicates no known allergies.   Review of Systems Review of Systems  Respiratory: Negative for shortness of breath.   Cardiovascular: Positive for chest pain.  Gastrointestinal: Positive for abdominal pain (right ribs).  Musculoskeletal: Positive for myalgias (right hip).  Neurological: Negative for syncope.  All other systems reviewed and are negative.  Physical Exam Updated Vital Signs BP 113/73   Pulse 88   Temp 99.2 F (37.3 C)   Resp 18   Ht 6' (1.829 m)   Wt 79.4 kg   SpO2 93%   BMI 23.73 kg/m   Physical Exam  Constitutional: He appears well-developed and well-nourished. No distress.  Awake, alert, nontoxic appearance  HENT:  Head: Normocephalic and atraumatic.  Mouth/Throat: Oropharynx is clear and moist. No oropharyngeal exudate.  Eyes: Conjunctivae are normal. No scleral icterus.  Neck: Normal range of motion. Neck supple.  Full ROM without pain  Cardiovascular: Normal  rate, regular rhythm and intact distal pulses.   Pulmonary/Chest: [place] normal and breath sounds normal. No respiratory distress. He has no wheezes.  Equal chest expansion  Abdominal: Soft. Bowel sounds are normal. He exhibits no distension and no mass. There is no tenderness. There is no rebound and no guarding.  Musculoskeletal: Normal range of motion. He exhibits no edema.  Full range of motion of the T-spine and L-spine No midline tenderness to the  T-spine or L-spine No tenderness to palpation of the paraspinous muscles of the L-spine  R hip with full range of motion with some pain. Pain to palpation over the lateral hip. Well-healed surgical incision.  Somewhat decreased range of motion of the right knee due to pain. Full range of motion of the right ankle and toes.  Lymphadenopathy:    He has no cervical adenopathy.  Neurological: He is alert. GCS eye subscore is 4. GCS verbal subscore is 5. GCS motor subscore is 6.  Speech is clear and goal oriented Moves extremities without ataxia  Skin: Skin is warm and dry. No rash noted. He is not diaphoretic. No erythema. There is pallor.  Psychiatric: He has a normal mood and affect. His behavior is normal.  Nursing note and vitals reviewed.   ED Treatments / Results  Labs (all labs ordered are listed, but only abnormal results are displayed) Labs Reviewed  CBC WITH DIFFERENTIAL/PLATELET - Abnormal; Notable for the following:       Result Value   WBC 16.7 (*)    RBC 2.21 (*)    Hemoglobin 6.7 (*)    HCT 20.3 (*)    RDW 20.1 (*)    Platelets 469 (*)    Neutro Abs 12.4 (*)    Monocytes Absolute 1.8 (*)    Basophils Absolute 0.2 (*)    All other components within normal limits  RETICULOCYTES - Abnormal; Notable for the following:    RBC. 2.21 (*)    All other components within normal limits  COMPREHENSIVE METABOLIC PANEL - Abnormal; Notable for the following:    CO2 21 (*)    Glucose, Bld 100 (*)    BUN 22 (*)  Calcium 8.7 (*)     Total Bilirubin 3.2 (*)    All other components within normal limits  CBC WITH DIFFERENTIAL/PLATELET  COMPREHENSIVE METABOLIC PANEL  LACTATE DEHYDROGENASE  CBC WITH DIFFERENTIAL/PLATELET  I-STAT TROPOININ, ED  TYPE AND SCREEN    EKG  EKG Interpretation  Date/Time:  Friday September 18 2015 02:19:35 EDT Ventricular Rate:  88 PR Interval:    QRS Duration: 111 QT Interval:  395 QTC Calculation: 478 R Axis:   -95 Text Interpretation:  Sinus rhythm Left atrial enlargement Probable RVH w/ secondary repol abnormality Inferior infarct, old When compared with ECG of 08/02/2015, No significant change was found Confirmed by [facility]  MD, DAVID ([id number]) on 09/18/2015 2:27:57 AM       Radiology Dg Chest 2 View  Result Date: 09/18/2015 CLINICAL DATA:  Status post fall, with upper anterior chest pain. Initial encounter. EXAM: CHEST  2 VIEW COMPARISON:  Chest radiograph performed 09/01/2015 FINDINGS: The lungs are well-aerated. Mild vascular congestion is noted, with mild bibasilar atelectasis. There is no evidence of pleural effusion or pneumothorax. The heart is enlarged. A left-sided chest port ends about the cavoatrial junction. No acute osseous abnormalities are seen. IMPRESSION: Mild vascular congestion and cardiomegaly noted. Mild bibasilar atelectasis seen. No displaced rib fractures identified. Electronically Signed   By: Garald Balding M.D.   On: 09/18/2015 02:17   Dg Lumbar Spine Complete  Result Date: 09/18/2015 CLINICAL DATA:  Status post fall, with lower back pain radiating to both hips. Initial encounter. EXAM: LUMBAR SPINE - COMPLETE 4+ VIEW COMPARISON:  CT of the abdomen and pelvis from 06/03/2015 FINDINGS: There is no evidence of fracture or subluxation. Vertebral bodies demonstrate normal height and alignment. Intervertebral disc spaces are preserved. The visualized neural foramina are grossly unremarkable in appearance. A right hip arthroplasty is only partially imaged but appears  grossly unremarkable. The visualized bowel gas pattern is unremarkable in appearance; air and stool are noted within the colon. The sacroiliac joints are within normal limits. Clips are noted within the right upper quadrant, reflecting prior cholecystectomy. IMPRESSION: No evidence of fracture or subluxation along the lumbar spine. Electronically Signed   By: Garald Balding M.D.   On: 09/18/2015 02:19   Dg Hip Unilat W Or Wo Pelvis 2-3 Views Right  Result Date: 09/18/2015 CLINICAL DATA:  Status post fall, landing on right side, with right hip pain. Initial encounter. EXAM: DG HIP (WITH OR WITHOUT PELVIS) 2-3V RIGHT COMPARISON:  None. FINDINGS: There is no evidence of fracture or dislocation. The patient's right hip arthroplasty is grossly unremarkable in appearance, without evidence of loosening. Underlying screws are noted along the right acetabulum. Both femoral heads are seated normally within their respective acetabula. The proximal right femur appears intact. No significant degenerative change is appreciated. The sacroiliac joints are unremarkable in appearance. The visualized bowel gas pattern is grossly unremarkable in appearance. IMPRESSION: No evidence of fracture or dislocation. Right hip arthroplasty is grossly intact, without evidence of loosening. Electronically Signed   By: Garald Balding M.D.   On: 09/18/2015 02:16    Procedures Procedures (including critical care time)  DIAGNOSTIC STUDIES: Oxygen Saturation is 90% on RA, low by my interpretation.    COORDINATION OF CARE: 1:53 AM Discussed treatment plan with pt at bedside and pt agreed to plan.  Medications Ordered in ED Medications  ondansetron (ZOFRAN) injection 4 mg (not administered)  morphine (MS CONTIN) 12 hr tablet 30 mg (not administered)  zolpidem (AMBIEN) tablet 10 mg (not administered)  ambrisentan (LETAIRIS) tablet 5 mg (not administered)  tadalafil (CIALIS) tablet 10 mg (not administered)  lisinopril  (PRINIVIL,ZESTRIL) tablet 5 mg (not administered)  rivaroxaban (XARELTO) tablet 20 mg (not administered)  Deferasirox TABS 1,080 mg (not administered)  gabapentin (NEURONTIN) capsule 300 mg (not administered)  folic acid (FOLVITE) tablet 1 mg (not administered)  aspirin chewable tablet 81 mg (not administered)  senna-docusate (Senokot-S) tablet 1 tablet (not administered)  polyethylene glycol (MIRALAX / GLYCOLAX) packet 17 g (not administered)  naloxone (NARCAN) injection 0.4 mg (not administered)    And  sodium chloride flush (NS) 0.9 % injection 9 mL (not administered)  ondansetron (ZOFRAN) injection 4 mg (not administered)  0.45 % sodium chloride infusion (not administered)  ketorolac (TORADOL) 15 MG/ML injection 15 mg (not administered)  HYDROmorphone (DILAUDID) 1 mg/mL PCA injection (not administered)  HYDROmorphone (DILAUDID) injection 2 mg (2 mg Intravenous Given 09/18/15 0234)    Or  HYDROmorphone (DILAUDID) injection 2 mg ( Subcutaneous See Alternative 09/18/15 0234)  HYDROmorphone (DILAUDID) injection 2.5 mg (2.5 mg Intravenous Given 09/18/15 0357)    Or  HYDROmorphone (DILAUDID) injection 2.5 mg ( Subcutaneous See Alternative 09/18/15 0357)  HYDROmorphone (DILAUDID) injection 2.5 mg (2.5 mg Intravenous Given 09/18/15 0507)    Or  HYDROmorphone (DILAUDID) injection 2.5 mg ( Subcutaneous See Alternative 09/18/15 0507)     Initial Impression / Assessment and Plan / ED Course  I have reviewed the triage vital signs and the nursing notes.  Pertinent labs & imaging results that were available during my care of the patient were reviewed by me and considered in my medical decision making (see chart for details).  Clinical Course  Value Comment By Time  Retic Ct Pct: 1.6 Retic not elevated Abigail Butts, PA-C 08/18 0324  WBC: (!) 16.7 Leukocytosis Siraj Dermody, PA-C 08/18 0325  Hemoglobin: (!!) 6.7 Severe anemia, but appears baseline.  Pt reports blood transfusions in the  past  Abigail Butts, PA-C 08/18 0325  DG Lumbar Spine Complete Normal l-spine films Abigail Butts, PA-C 08/18 0325  DG Chest 2 View Mild vascular congestion and cardiomegaly noted. Mild bibasilar atelectasis seen. No displaced rib fractures identified. Jarrett Soho Torrion Witter, PA-C 08/18 0326  DG Hip Unilat W or Wo Pelvis 2-3 Views Right No evidence of fracture or dislocation. Right hip arthroplasty is grossly intact, without evidence of loosening. Abigail Butts, PA-C 08/18 [phone]  EKG 12-Lead (Reviewed) Abigail Butts, PA-C 08/18 0327   Discussed with Dr. Hal Hope who will admit to [facility], PA-C 08/18 [phone]   Patient with mechanical fall that sickle cell pain crisis prior to that. Uncontrolled with multiple doses of Dilaudid. Hemoglobin severely low however baseline and no increase in reticulocytes. Will admit.  I personally performed the services described in this documentation, which was scribed in my presence. The recorded information has been reviewed and is accurate.  The patient was discussed with and seen by Dr. Roxanne Mins who agrees with the treatment plan.   Final Clinical Impressions(s) / ED Diagnoses   Final diagnoses:  Sickle cell pain crisis ([place])  Fall from slip, trip, or stumble, initial encounter  Right hip pain    New Prescriptions New Prescriptions   No medications on file     Abigail Butts, PA-C [id number]    David Glick, MD [phone] [id number]

## 2015-09-18 NOTE — Progress Notes (Signed)
MEDICATION RELATED CONSULT NOTE - INITIAL   Pharmacy Consult for Hydroxyurea Indication: Hb SS with crisis  No Known Allergies  Patient Measurements: Height: 6' (182.9 cm) Weight: 175 lb (79.4 kg) IBW/kg (Calculated) : 77.6 Adjusted Body Weight:   Vital Signs: Temp: 99.2 F (37.3 C) (08/17 2202) BP: 135/94 (08/18 0600) Pulse Rate: 72 (08/18 0600) Intake/Output from previous day: No intake/output data recorded. Intake/Output from this shift: No intake/output data recorded.  Labs:  Recent Labs  09/15/15 1049 09/18/15 0239  WBC 13.6* 16.7*  HGB 7.3* 6.7*  HCT 22.5* 20.3*  PLT 506* 469*  CREATININE  --  0.89  ALBUMIN  --  4.5  PROT  --  7.5  AST  --  35  ALT  --  21  ALKPHOS  --  81  BILITOT  --  3.2*   Estimated Creatinine Clearance: 125.9 mL/min (by C-G formula based on SCr of 0.89 mg/dL).   Microbiology: No results found for this or any previous visit (from the past 720 hour(s)).  Medical History: Past Medical History:  Diagnosis Date  . Acute chest syndrome ([place]) 06/18/2013  . Acute embolism and thrombosis of right internal jugular vein ([name])   . Alcohol consumption of one to four drinks per day   . Avascular necrosis (HCC)    Right Hip  . Blood transfusion   . Chronic anticoagulation   . Demand ischemia ([place]) 01/02/2014  . Former smoker   . Functional asplenia   . Hb-SS disease with crisis ([place])   . History of Clostridium difficile infection   . History of pulmonary embolus (PE)   . Hypertension   . Hypokalemia   . Leukocytosis    Chronic  . Mood disorder ([place])   . Noncompliance with medication regimen   . Oxygen deficiency   . Pulmonary hypertension ([place])   . Second hand tobacco smoke exposure   . Sickle cell anemia (HCC)   . Sickle-cell crisis with associated acute chest syndrome ([place]) 05/13/2013  . Thrombocytosis (HCC)    Chronic  . Uses marijuana     Medications:   (Not in a hospital admission)  Assessment: While the medication was  not reordered on admission, pharmacy noted labs on admit and hold criteria was met for Retic count.  Goal of Therapy:  Safe medication use of hydroxyurea  Plan:  Will follow labs daily  [name] 09/18/2015,6:30 AM

## 2015-09-18 NOTE — H&P (Signed)
History and Physical    [name] [id number] DOB: Jan 19, 1981 DOA: 09/18/2015  PCP: [name], MD  Patient coming from: Home.  Chief Complaint: Rib pain and right hip pain.  HPI: [name] is a 35 y.o. male with sickle cell anemia, pulmonary hypertension, history of DVT and PE presents to the ER because of right hip pain and bilateral rib pain. Patient states 2 days ago he had a fall since then his right hip has been hurting. He also has been having increasing bilateral rib pain typical of his sickle cell anemia pain crisis. X-rays of the right hip does not show any acute fractures. Chest x-ray shows congestion. Patient is not in any obvious respiratory distress. Patient is being admitted for pain control for sickle cell anemia pain crisis. Patient's reticulocyte count is not elevated.   ED Course: Patient was given pain relief medications.  Review of Systems: As per HPI, rest all negative.   Past Medical History:  Diagnosis Date  . Acute chest syndrome ([place]) 06/18/2013  . Acute embolism and thrombosis of right internal jugular vein ([place])   . Alcohol consumption of one to four drinks per day   . Avascular necrosis (HCC)    Right Hip  . Blood transfusion   . Chronic anticoagulation   . Demand ischemia ([place]) 01/02/2014  . Former smoker   . Functional asplenia   . Hb-SS disease with crisis ([place])   . History of Clostridium difficile infection   . History of pulmonary embolus (PE)   . Hypertension   . Hypokalemia   . Leukocytosis    Chronic  . Mood disorder ([place])   . Noncompliance with medication regimen   . Oxygen deficiency   . Pulmonary hypertension ([place])   . Second hand tobacco smoke exposure   . Sickle cell anemia (HCC)   . Sickle-cell crisis with associated acute chest syndrome ([place]) 05/13/2013  . Thrombocytosis (HCC)    Chronic  . Uses marijuana     Past Surgical History:  Procedure Laterality Date  . CHOLECYSTECTOMY     01/2008  .  Excision of left periauricular cyst     10/2009  . Excision of right ear lobe cyst with primary closur     11/2007  . Porta cath placement    . Porta cath removal    . PORTACATH PLACEMENT  01/05/2012   Procedure: INSERTION PORT-A-CATH;  Surgeon: Odis Hollingshead, MD;  Location: [place] [facility];  Service: General;  Laterality: N/A;  ultrasound guiced port a cath insertion with fluoroscopy  . Right hip replacement     08/2006  . UMBILICAL HERNIA REPAIR     01/2008     reports that he quit smoking about 4 years ago. His smoking use included Cigarettes. He has a 5.00 pack-year smoking history. He has never used smokeless tobacco. He reports that he does not drink alcohol or use drugs.  No Known Allergies  Family History  Problem Relation Age of Onset  . Sickle cell trait Mother   . Depression Mother   . Diabetes Mother   . Sickle cell trait Father   . Sickle cell trait Brother     Prior to Admission medications   Medication Sig Start Date End Date Taking? Authorizing Provider  ambrisentan (LETAIRIS) 5 MG tablet Take 5 mg by mouth at bedtime.    Yes Historical Provider, MD  aspirin 81 MG chewable tablet Chew 1 tablet (81 mg total) by mouth daily. 07/23/14  Yes [name], MD  Cholecalciferol (VITAMIN D) 2000 units tablet Take 1 tablet (2,000 Units total) by mouth daily. 02/23/15  Yes Dorena Dew, FNP  Deferasirox (JADENU) 360 MG TABS Take 1,080 mg by mouth daily.    Yes Historical Provider, MD  folic acid (FOLVITE) 1 MG tablet Take 1 tablet (1 mg total) by mouth every morning. 02/23/15  Yes Dorena Dew, FNP  gabapentin (NEURONTIN) 300 MG capsule Take 1 capsule (300 mg total) by mouth 3 (three) times daily. 06/01/15  Yes Tresa Garter, MD  HYDROmorphone (DILAUDID) 4 MG tablet Take 1 tablet (4 mg total) by mouth every 4 (four) hours as needed for severe pain. 09/04/15  Yes Tresa Garter, MD  hydroxyurea (HYDREA) 500 MG capsule Take 2 capsules (1,000 mg total) by mouth daily.  May take with food to minimize GI side effects. Patient taking differently: Take 500 mg by mouth 3 (three) times daily. May take with food to minimize GI side effects. 06/01/15  Yes Tresa Garter, MD  lisinopril (PRINIVIL,ZESTRIL) 5 MG tablet Take 1 tablet (5 mg total) by mouth daily. 08/04/15  Yes [name], MD  morphine (MS CONTIN) 30 MG 12 hr tablet Take 1 tablet (30 mg total) by mouth every 12 (twelve) hours. 09/07/15  Yes Tresa Garter, MD  rivaroxaban (XARELTO) 20 MG TABS tablet Take 20 mg by mouth daily with supper.   Yes Historical Provider, MD  torsemide (DEMADEX) 20 MG tablet Take 20 mg by mouth daily as needed. For increase in patient weight greater than 3 lbs. 07/27/15 07/26/16 Yes Historical Provider, MD  zolpidem (AMBIEN) 10 MG tablet Take 1 tablet (10 mg total) by mouth at bedtime as needed for sleep. 09/07/15  Yes Tresa Garter, MD  potassium chloride SA (K-DUR,KLOR-CON) 20 MEQ tablet Take 1 tablet (20 mEq total) by mouth every morning. Patient not taking: Reported on 08/28/2015 04/13/15   Dorena Dew, FNP  tadalafil (CIALIS) 10 MG tablet Take 1 tablet (10 mg total) by mouth daily as needed for erectile dysfunction. Patient not taking: Reported on 08/28/2015 08/07/15   Tresa Garter, MD    Physical Exam: Vitals:   09/18/15 0346 09/18/15 0400 09/18/15 0430 09/18/15 0500  BP: 103/68 111/74 120/80 108/64  Pulse: 88 82 84 74  Resp: 15 16 17 19   Temp:      SpO2: 94% 92% 91% 93%  Weight:      Height:          Constitutional: Not in distress. Vitals:   09/18/15 0346 09/18/15 0400 09/18/15 0430 09/18/15 0500  BP: 103/68 111/74 120/80 108/64  Pulse: 88 82 84 74  Resp: 15 16 17 19   Temp:      SpO2: 94% 92% 91% 93%  Weight:      Height:       Eyes: Anicteric no pallor. ENMT: No discharge from the ears eyes nose or mouth. Neck: No mass felt. No neck rigidity. Respiratory: No rhonchi or crepitations. Cardiovascular: S1 and S2 heard. Abdomen: Soft  nontender bowel sounds present. No guarding or rigidity. Musculoskeletal: No edema. Patient able to move his right hip without any difficulty. Skin: No rash. Neurologic: Alert awake oriented to time place and person. Moves all extremities. Psychiatric: Appears normal.   Labs on Admission: I have personally reviewed following labs and imaging studies  CBC:  Recent Labs Lab 09/15/15 1049 09/18/15 0239  WBC 13.6* 16.7*  NEUTROABS 9.8* 12.4*  HGB 7.3*  6.7*  HCT 22.5* 20.3*  MCV 91.5 91.9  PLT 506* [id number]*   Basic Metabolic Panel:  Recent Labs Lab 09/18/15 0239  NA 136  K 4.8  CL 110  CO2 21*  GLUCOSE 100*  BUN 22*  CREATININE 0.89  CALCIUM 8.7*   GFR: Estimated Creatinine Clearance: 125.9 mL/min (by C-G formula based on SCr of 0.89 mg/dL). Liver Function Tests:  Recent Labs Lab 09/18/15 0239  AST 35  ALT 21  ALKPHOS 81  BILITOT 3.2*  PROT 7.5  ALBUMIN 4.5   No results for input(s): LIPASE, AMYLASE in the last 168 hours. No results for input(s): AMMONIA in the last 168 hours. Coagulation Profile: No results for input(s): INR, PROTIME in the last 168 hours. Cardiac Enzymes: No results for input(s): CKTOTAL, CKMB, CKMBINDEX, TROPONINI in the last 168 hours. BNP (last 3 results) No results for input(s): PROBNP in the last 8760 hours. HbA1C: No results for input(s): HGBA1C in the last 72 hours. CBG: No results for input(s): GLUCAP in the last 168 hours. Lipid Profile: No results for input(s): CHOL, HDL, LDLCALC, TRIG, CHOLHDL, LDLDIRECT in the last 72 hours. Thyroid Function Tests: No results for input(s): TSH, T4TOTAL, FREET4, T3FREE, THYROIDAB in the last 72 hours. Anemia Panel:  Recent Labs  09/15/15 1049 09/18/15 0239  RETICCTPCT 3.5* 1.6   Urine analysis:    Component Value Date/Time   COLORURINE YELLOW 07/18/2015 1550   APPEARANCEUR CLEAR 07/18/2015 1550   LABSPEC 1.013 07/18/2015 1550   PHURINE 6.5 07/18/2015 1550   GLUCOSEU NEGATIVE  07/18/2015 1550   HGBUR NEGATIVE 07/18/2015 1550   BILIRUBINUR NEGATIVE 07/18/2015 1550   KETONESUR NEGATIVE 07/18/2015 1550   PROTEINUR NEGATIVE 07/18/2015 1550   UROBILINOGEN 0.2 03/24/2015 1426   NITRITE NEGATIVE 07/18/2015 1550   LEUKOCYTESUR NEGATIVE 07/18/2015 1550   Sepsis Labs: @LABRCNTIP (procalcitonin:4,lacticidven:4) )No results found for this or any previous visit (from the past 240 hour(s)).   Radiological Exams on Admission: Dg Chest 2 View  Result Date: 09/18/2015 CLINICAL DATA:  Status post fall, with upper anterior chest pain. Initial encounter. EXAM: CHEST  2 VIEW COMPARISON:  Chest radiograph performed 09/01/2015 FINDINGS: The lungs are well-aerated. Mild vascular congestion is noted, with mild bibasilar atelectasis. There is no evidence of pleural effusion or pneumothorax. The heart is enlarged. A left-sided chest port ends about the cavoatrial junction. No acute osseous abnormalities are seen. IMPRESSION: Mild vascular congestion and cardiomegaly noted. Mild bibasilar atelectasis seen. No displaced rib fractures identified. Electronically Signed   By: Garald Balding M.D.   On: 09/18/2015 02:17   Dg Lumbar Spine Complete  Result Date: 09/18/2015 CLINICAL DATA:  Status post fall, with lower back pain radiating to both hips. Initial encounter. EXAM: LUMBAR SPINE - COMPLETE 4+ VIEW COMPARISON:  CT of the abdomen and pelvis from 06/03/2015 FINDINGS: There is no evidence of fracture or subluxation. Vertebral bodies demonstrate normal height and alignment. Intervertebral disc spaces are preserved. The visualized neural foramina are grossly unremarkable in appearance. A right hip arthroplasty is only partially imaged but appears grossly unremarkable. The visualized bowel gas pattern is unremarkable in appearance; air and stool are noted within the colon. The sacroiliac joints are within normal limits. Clips are noted within the right upper quadrant, reflecting prior cholecystectomy.  IMPRESSION: No evidence of fracture or subluxation along the lumbar spine. Electronically Signed   By: Garald Balding M.D.   On: 09/18/2015 02:19   Dg Hip Unilat W Or Wo Pelvis 2-3 Views Right  Result Date:  09/18/2015 CLINICAL DATA:  Status post fall, landing on right side, with right hip pain. Initial encounter. EXAM: DG HIP (WITH OR WITHOUT PELVIS) 2-3V RIGHT COMPARISON:  None. FINDINGS: There is no evidence of fracture or dislocation. The patient's right hip arthroplasty is grossly unremarkable in appearance, without evidence of loosening. Underlying screws are noted along the right acetabulum. Both femoral heads are seated normally within their respective acetabula. The proximal right femur appears intact. No significant degenerative change is appreciated. The sacroiliac joints are unremarkable in appearance. The visualized bowel gas pattern is grossly unremarkable in appearance. IMPRESSION: No evidence of fracture or dislocation. Right hip arthroplasty is grossly intact, without evidence of loosening. Electronically Signed   By: Garald Balding M.D.   On: 09/18/2015 02:16    Assessment/Plan Principal Problem:   Sickle cell pain crisis ([place]) Active Problems:   Pulmonary HTN ([place])    1. Sickle cell pain crisis - I have placed patient on weight-based Dilaudid PCA and continue his home dose of long-acting pain medications. Hold off hydroxyurea for now due to decreased reticulocyte count response. Follow CBC. Hemoglobin appears to be at baseline. Chest x-ray does show congestion but no definite signs of acute chest syndrome. 2. Right hip pain status post fall - x-rays did not show any definite fracture. If pain persists may consider MRI. 3. History of pulmonary hypertension - continue ambrisentan and Cialis. 4. History of DVT and PE on Xarelto. 5. Anemia - follow CBC.   DVT prophylaxis: Xarelto. Code Status: Full code.  Family Communication: No family at the bedside.  Disposition Plan: Home.    Consults called: None.  Admission status: Observation.    Rise Patience MD [facility] Pager [phone].  If 7PM-7AM, please contact night-coverage [email or web address] Password [email or web address]  09/18/2015, 5:22 AM

## 2015-09-21 ENCOUNTER — Other Ambulatory Visit (INDEPENDENT_AMBULATORY_CARE_PROVIDER_SITE_OTHER): Payer: Medicare Other

## 2015-09-21 DIAGNOSIS — D571 Sickle-cell disease without crisis: Secondary | ICD-10-CM | POA: Diagnosis not present

## 2015-09-21 LAB — HUMAN PARVOVIRUS DNA DETECTION BY PCR: Parvovirus B19, PCR: NEGATIVE

## 2015-09-22 DIAGNOSIS — M25551 Pain in right hip: Secondary | ICD-10-CM | POA: Diagnosis not present

## 2015-09-22 LAB — CBC WITH DIFFERENTIAL/PLATELET
BASOS ABS: 111 {cells}/uL (ref 0–200)
Basophils Relative: 1 %
EOS ABS: 222 {cells}/uL (ref 15–500)
Eosinophils Relative: 2 %
HEMATOCRIT: 19.8 % — AB (ref 38.5–50.0)
HEMOGLOBIN: 6.3 g/dL — AB (ref 13.2–17.1)
LYMPHS ABS: 1332 {cells}/uL (ref 850–3900)
LYMPHS PCT: 12 %
MCH: 29 pg (ref 27.0–33.0)
MCHC: 31.8 g/dL — AB (ref 32.0–36.0)
MCV: 91.2 fL (ref 80.0–100.0)
MONO ABS: 1665 {cells}/uL — AB (ref 200–950)
MPV: 9.2 fL (ref 7.5–12.5)
Monocytes Relative: 15 %
NEUTROS PCT: 70 %
Neutro Abs: 7770 cells/uL (ref 1500–7800)
Platelets: 470 10*3/uL — ABNORMAL HIGH (ref 140–400)
RBC: 2.17 MIL/uL — ABNORMAL LOW (ref 4.20–5.80)
RDW: 20.4 % — ABNORMAL HIGH (ref 11.0–15.0)
WBC: 11.1 10*3/uL — ABNORMAL HIGH (ref 3.8–10.8)

## 2015-09-22 LAB — TYPE AND SCREEN
ABO/RH(D): O POS
ANTIBODY SCREEN: NEGATIVE
UNIT DIVISION: 0

## 2015-09-22 LAB — RETICULOCYTES
ABS Retic: 41230 cells/uL (ref 25000–90000)
RBC.: 2.17 MIL/uL — AB (ref 4.20–5.80)
Retic Ct Pct: 1.9 %

## 2015-09-23 ENCOUNTER — Non-Acute Institutional Stay (HOSPITAL_COMMUNITY)
Admission: AD | Admit: 2015-09-23 | Discharge: 2015-09-23 | Disposition: A | Discharge status: Home / Self Care | Payer: Medicare Other | Source: Ambulatory Visit | Attending: Internal Medicine | Admitting: Internal Medicine

## 2015-09-23 ENCOUNTER — Telehealth (HOSPITAL_COMMUNITY): Payer: Self-pay | Admitting: *Deleted

## 2015-09-23 ENCOUNTER — Telehealth: Payer: Self-pay | Admitting: *Deleted

## 2015-09-23 ENCOUNTER — Non-Acute Institutional Stay (HOSPITAL_COMMUNITY): Payer: Medicare Other

## 2015-09-23 DIAGNOSIS — I10 Essential (primary) hypertension: Secondary | ICD-10-CM | POA: Diagnosis not present

## 2015-09-23 DIAGNOSIS — Z79891 Long term (current) use of opiate analgesic: Secondary | ICD-10-CM | POA: Diagnosis not present

## 2015-09-23 DIAGNOSIS — Z86711 Personal history of pulmonary embolism: Secondary | ICD-10-CM | POA: Diagnosis not present

## 2015-09-23 DIAGNOSIS — Z79899 Other long term (current) drug therapy: Secondary | ICD-10-CM | POA: Insufficient documentation

## 2015-09-23 DIAGNOSIS — Z9981 Dependence on supplemental oxygen: Secondary | ICD-10-CM | POA: Diagnosis not present

## 2015-09-23 DIAGNOSIS — I272 Other secondary pulmonary hypertension: Secondary | ICD-10-CM | POA: Insufficient documentation

## 2015-09-23 DIAGNOSIS — J9610 Chronic respiratory failure, unspecified whether with hypoxia or hypercapnia: Secondary | ICD-10-CM | POA: Diagnosis not present

## 2015-09-23 DIAGNOSIS — D5700 Hb-SS disease with crisis, unspecified: Secondary | ICD-10-CM | POA: Diagnosis not present

## 2015-09-23 DIAGNOSIS — Z96641 Presence of right artificial hip joint: Secondary | ICD-10-CM | POA: Insufficient documentation

## 2015-09-23 DIAGNOSIS — R509 Fever, unspecified: Secondary | ICD-10-CM | POA: Diagnosis not present

## 2015-09-23 DIAGNOSIS — Z86718 Personal history of other venous thrombosis and embolism: Secondary | ICD-10-CM | POA: Insufficient documentation

## 2015-09-23 DIAGNOSIS — Z7982 Long term (current) use of aspirin: Secondary | ICD-10-CM | POA: Insufficient documentation

## 2015-09-23 DIAGNOSIS — Z87891 Personal history of nicotine dependence: Secondary | ICD-10-CM | POA: Insufficient documentation

## 2015-09-23 DIAGNOSIS — Z7901 Long term (current) use of anticoagulants: Secondary | ICD-10-CM | POA: Diagnosis not present

## 2015-09-23 LAB — CBC WITH DIFFERENTIAL/PLATELET
Basophils Absolute: 0.1 10*3/uL (ref 0.0–0.1)
Basophils Relative: 1 %
EOS ABS: 0.1 10*3/uL (ref 0.0–0.7)
EOS PCT: 1 %
HCT: 21 % — ABNORMAL LOW (ref 39.0–52.0)
Hemoglobin: 7 g/dL — ABNORMAL LOW (ref 13.0–17.0)
LYMPHS ABS: 1.4 10*3/uL (ref 0.7–4.0)
Lymphocytes Relative: 12 %
MCH: 29.7 pg (ref 26.0–34.0)
MCHC: 33.3 g/dL (ref 30.0–36.0)
MCV: 89 fL (ref 78.0–100.0)
MONOS PCT: 11 %
Monocytes Absolute: 1.3 10*3/uL — ABNORMAL HIGH (ref 0.1–1.0)
NEUTROS PCT: 77 %
Neutro Abs: 9.2 10*3/uL — ABNORMAL HIGH (ref 1.7–7.7)
PLATELETS: 509 10*3/uL — AB (ref 150–400)
RBC: 2.36 MIL/uL — ABNORMAL LOW (ref 4.22–5.81)
RDW: 19.9 % — AB (ref 11.5–15.5)
WBC: 12 10*3/uL — ABNORMAL HIGH (ref 4.0–10.5)

## 2015-09-23 LAB — RETICULOCYTES
RBC.: 2.36 MIL/uL — ABNORMAL LOW (ref 4.22–5.81)
RETIC CT PCT: 2.4 % (ref 0.4–3.1)
Retic Count, Absolute: 56.6 10*3/uL (ref 19.0–186.0)

## 2015-09-23 MED ORDER — KETOROLAC TROMETHAMINE 30 MG/ML IJ SOLN
30.0000 mg | Freq: Four times a day (QID) | INTRAMUSCULAR | Status: DC
  Administered 2015-09-23: 30 mg via INTRAVENOUS
  Filled 2015-09-23: qty 1

## 2015-09-23 MED ORDER — SENNOSIDES-DOCUSATE SODIUM 8.6-50 MG PO TABS
1.0000 | ORAL_TABLET | Freq: Two times a day (BID) | ORAL | Status: DC
  Administered 2015-09-23: 1 via ORAL
  Filled 2015-09-23: qty 1

## 2015-09-23 MED ORDER — ONDANSETRON HCL 4 MG/2ML IJ SOLN: 4.0000 mg | Freq: Four times a day (QID) | INTRAMUSCULAR | Status: DC | PRN

## 2015-09-23 MED ORDER — SODIUM CHLORIDE 0.9% FLUSH
10.0000 mL | INTRAVENOUS | Status: AC | PRN
  Administered 2015-09-23: 10 mL

## 2015-09-23 MED ORDER — SODIUM CHLORIDE 0.9% FLUSH: 9.0000 mL | INTRAVENOUS | Status: DC | PRN

## 2015-09-23 MED ORDER — DEXTROSE-NACL 5-0.45 % IV SOLN
INTRAVENOUS | Status: DC
  Administered 2015-09-23: 10:00:00 via INTRAVENOUS

## 2015-09-23 MED ORDER — HEPARIN SOD (PORK) LOCK FLUSH 100 UNIT/ML IV SOLN
500.0000 [IU] | INTRAVENOUS | Status: AC | PRN
  Administered 2015-09-23: 500 [IU]
  Filled 2015-09-23: qty 5

## 2015-09-23 MED ORDER — POLYETHYLENE GLYCOL 3350 17 G PO PACK
17.0000 g | PACK | Freq: Every day | ORAL | Status: DC | PRN
  Filled 2015-09-23: qty 1

## 2015-09-23 MED ORDER — HYDROMORPHONE 1 MG/ML IV SOLN
INTRAVENOUS | Status: DC
  Administered 2015-09-23: 10:00:00 via INTRAVENOUS
  Administered 2015-09-23: 12.6 mg via INTRAVENOUS
  Administered 2015-09-23: 9.1 mg via INTRAVENOUS
  Filled 2015-09-23: qty 25

## 2015-09-23 MED ORDER — NALOXONE HCL 0.4 MG/ML IJ SOLN: 0.4000 mg | INTRAMUSCULAR | Status: DC | PRN

## 2015-09-23 MED ORDER — DIPHENHYDRAMINE HCL 25 MG PO CAPS
25.0000 mg | ORAL_CAPSULE | ORAL | Status: DC | PRN
  Administered 2015-09-23: 50 mg via ORAL
  Filled 2015-09-23: qty 2

## 2015-09-23 MED ORDER — SODIUM CHLORIDE 0.9 % IV SOLN
25.0000 mg | INTRAVENOUS | Status: DC | PRN
  Filled 2015-09-23: qty 0.5

## 2015-09-23 NOTE — Telephone Encounter (Signed)
Called [facility] complaining of pain in bilateral ribs and back. Rating pain intensity at 9/10. Thinks he is running a fever, states "I was sweating all night". Denies chest pain, N/V, diarrhea or abdominal pain and denies priapism. Took Dilaudid and MS contin at 6 am without relief.

## 2015-09-23 NOTE — Progress Notes (Signed)
CXR done

## 2015-09-23 NOTE — Progress Notes (Signed)
Admitted to the [facility] for complaints of pain in bilateral rib cage and back. Rating pain intensity 9/10. Treated with IV fluids, IV Toradol and PCA Dilaudid. CXR completed. Pain level down to 5/10 at time of discharge. Went over discharge instructions and copy given to patient. Patient alert, oriented and ambulatory at time of discharge. Discharged to home via ride.

## 2015-09-23 NOTE — Telephone Encounter (Signed)
-----   Message from Tresa Garter, MD sent at 09/22/2015  4:29 PM EDT ----- Please inform patient to continue to HOLD Hydroxyurea, his Retic Count is still low. Encourage patient to follow up with Dr. Manuella Ghazi at [facility].

## 2015-09-23 NOTE — Telephone Encounter (Signed)
Patient verified DOB Patient is aware of HOLDING Hydroxyurea due to Retic count still being low. Patient states he has an appointment with Dr. Manuella Ghazi on 10/06/15. No further questions at this time.

## 2015-09-23 NOTE — Discharge Summary (Signed)
Physician Discharge Summary  [name] [id number] DOB: 1980/02/23 DOA: 09/23/2015  PCP: [name], MD  Admit date: 09/23/2015  Discharge date: 09/23/2015  Time spent: 30 minutes  Discharge Diagnoses:  Active Problems:   Sickle cell anemia with pain [facility])  Discharge Condition: Stable  Diet recommendation: Regular  There were no vitals filed for this visit.  History of present illness:  [name] is a 35 y.o. male with history of sickle cell disease, acute chest syndrome, DVT, PE on Xarelto, pulmonary hypertension, cor pulmonale, chronic respiratory failure on home oxygen, nonadherent with oxygen use and medications for SCD except the narcotics. He presented to the day hospital today complaining of pain in bilateral ribs and back. Rating pain intensity at 9/10. Thinks he is running a fever, states "I was sweating all night". Patient denies chest pain, shortness of breath, difficulty breathing.Patient denies N/V/D and abdominal pain.Patient states he has taken his home pain medication without improvement for several days.  Hospital Course:  [name] was admitted to the day hospital with sickle cell painful crisis. Patient was treated with weight based IV Dilaudid PCA, IV Toradol as well as IV fluids.Labs results were reviewed, stable, CXR showed no acute abnormality. [place] significant improvement symptomatically, pain improved from 9 to 5 out of 10 at the time of discharge. Darnel will follow-up at the clinic as previously scheduled, continue with home medications as per prior to admission.  Discharge Exam: Vitals:   09/23/15 1333 09/23/15 1415  BP:  119/71  Pulse:  77  Resp: 16 18  Temp:     General appearance: alert, cooperative and no distress Eyes: conjunctivae/corneas clear. PERRL, EOM's intact. Fundi benign. Neck: no adenopathy, no carotid bruit, no JVD, supple, symmetrical, trachea midline and thyroid not enlarged, symmetric, no  tenderness/mass/nodules Back: symmetric, no curvature. ROM normal. No CVA tenderness. Resp: clear to auscultation bilaterally Chest wall: no tenderness Cardio: regular rate and rhythm, S1, S2 normal, no murmur, click, rub or gallop GI: soft, non-tender; bowel sounds normal; no masses, no organomegaly Extremities: extremities normal, atraumatic, no cyanosis or edema Pulses: 2+ and symmetric Skin: Skin color, texture, turgor normal. No rashes or lesions Neurologic: Grossly normal  Discharge Instructions We discussed the need for good hydration, monitoring of hydration status, avoidance of heat, cold, stress, and infection triggers. We discussed the need to be compliant with taking Hydrea. [name] was reminded of the need to seek medical attention of any symptoms of bleeding, anemia, or infection occurs.  Discharge Instructions    Diet - low sodium heart healthy    Complete by:  As directed   Increase activity slowly    Complete by:  As directed     Current Discharge Medication List    CONTINUE these medications which have NOT CHANGED   Details  ambrisentan (LETAIRIS) 5 MG tablet Take 5 mg by mouth at bedtime.     aspirin 81 MG chewable tablet Chew 1 tablet (81 mg total) by mouth daily. Qty: 30 tablet, Refills: 11    Cholecalciferol (VITAMIN D) 2000 units tablet Take 1 tablet (2,000 Units total) by mouth daily. Qty: 90 tablet, Refills: 3   Associated Diagnoses: Vitamin D deficiency    Deferasirox (JADENU) 360 MG TABS Take 1,080 mg by mouth daily.     folic acid (FOLVITE) 1 MG tablet Take 1 tablet (1 mg total) by mouth every morning. Qty: 30 tablet, Refills: 11   Associated Diagnoses: Hb-SS disease without crisis ([place])    gabapentin (  NEURONTIN) 300 MG capsule Take 1 capsule (300 mg total) by mouth 3 (three) times daily. Qty: 270 capsule, Refills: 3    HYDROmorphone (DILAUDID) 4 MG tablet Take 1 tablet (4 mg total) by mouth every 4 (four) hours as needed for severe pain. Qty: 90  tablet, Refills: 0   Associated Diagnoses: Sickle-cell disease with pain ([place]); Chronic pain syndrome    lisinopril (PRINIVIL,ZESTRIL) 5 MG tablet Take 1 tablet (5 mg total) by mouth daily. Qty: 30 tablet, Refills: 1    morphine (MS CONTIN) 30 MG 12 hr tablet Take 1 tablet (30 mg total) by mouth every 12 (twelve) hours. Qty: 60 tablet, Refills: 0   Associated Diagnoses: Chronic pain syndrome    potassium chloride SA (K-DUR,KLOR-CON) 20 MEQ tablet Take 1 tablet (20 mEq total) by mouth every morning. Qty: 30 tablet, Refills: 3   Associated Diagnoses: Pulmonary HTN (HCC)    rivaroxaban (XARELTO) 20 MG TABS tablet Take 20 mg by mouth daily with supper.    torsemide (DEMADEX) 20 MG tablet Take 20 mg by mouth daily as needed. For increase in patient weight greater than 3 lbs.    zolpidem (AMBIEN) 10 MG tablet Take 1 tablet (10 mg total) by mouth at bedtime as needed for sleep. Qty: 30 tablet, Refills: 0   Associated Diagnoses: Insomnia       No Known Allergies   Significant Diagnostic Studies: Dg Chest 2 View  Result Date: 09/23/2015 CLINICAL DATA:  Fever today EXAM: CHEST  2 VIEW COMPARISON:  09/18/2015 FINDINGS: Left chest wall port is again seen and stable. Cardiac shadow remains enlarged. The lungs are well aerated bilaterally. Diffuse interstitial changes are again seen stable from the prior study. No focal infiltrate is seen. IMPRESSION: No acute abnormality noted. Electronically Signed   By: Inez Catalina M.D.   On: 09/23/2015 13:04   Dg Chest 2 View  Result Date: 09/18/2015 CLINICAL DATA:  Status post fall, with upper anterior chest pain. Initial encounter. EXAM: CHEST  2 VIEW COMPARISON:  Chest radiograph performed 09/01/2015 FINDINGS: The lungs are well-aerated. Mild vascular congestion is noted, with mild bibasilar atelectasis. There is no evidence of pleural effusion or pneumothorax. The heart is enlarged. A left-sided chest port ends about the cavoatrial junction. No acute  osseous abnormalities are seen. IMPRESSION: Mild vascular congestion and cardiomegaly noted. Mild bibasilar atelectasis seen. No displaced rib fractures identified. Electronically Signed   By: Garald Balding M.D.   On: 09/18/2015 02:17   Dg Chest 2 View  Result Date: 09/01/2015 CLINICAL DATA:  Sickle cell crisis.  Hypoxemia.  Weakness today. EXAM: CHEST  2 VIEW COMPARISON:  08/28/2015 FINDINGS: Left-sided Port-A-Cath tip to the level of the lower superior vena cava -right atrial junction. Heart is markedly enlarged and stable in configuration. There is mild pulmonary vascular congestion. No overt edema. There is minimal right lower lobe atelectasis. No pleural effusions or consolidations. Surgical clips are noted in the upper abdomen. Note is made of sclerosis within the right humeral head, raising the question of avascular necrosis. Left humeral head is not imaged today. IMPRESSION: Cardiomegaly and vascular congestion.  Right lower lobe atelectasis. Question avascular necrosis of the right humeral head. Electronically Signed   By: Nolon Nations M.D.   On: 09/01/2015 10:44   Dg Chest 2 View  Result Date: 08/28/2015 CLINICAL DATA:  Sickle cell crisis, chest pain, shortness of breath EXAM: CHEST  2 VIEW COMPARISON:  PA and lateral chest x-ray of August 07, 2015 FINDINGS: The  lungs are well-expanded. The interstitial markings are increased. Confluent alveolar opacity is present in the left upper lobe. The cardiac silhouette is chronically enlarged. The pulmonary vascularity is chronically engorged centrally. There is no pleural effusion. There is mild multilevel degenerative disc disease of the thoracic spine. The Port-A-Cath appliance catheter tip projects over the distal third of the SVC. IMPRESSION: Chronic cardiomegaly and central pulmonary vascular congestion. Patchy alveolar opacity in the left upper leg and may reflect pneumonia or confluent edema. Electronically Signed   By: David  Martinique M.D.   On:  08/28/2015 08:34  Dg Lumbar Spine Complete  Result Date: 09/18/2015 CLINICAL DATA:  Status post fall, with lower back pain radiating to both hips. Initial encounter. EXAM: LUMBAR SPINE - COMPLETE 4+ VIEW COMPARISON:  CT of the abdomen and pelvis from 06/03/2015 FINDINGS: There is no evidence of fracture or subluxation. Vertebral bodies demonstrate normal height and alignment. Intervertebral disc spaces are preserved. The visualized neural foramina are grossly unremarkable in appearance. A right hip arthroplasty is only partially imaged but appears grossly unremarkable. The visualized bowel gas pattern is unremarkable in appearance; air and stool are noted within the colon. The sacroiliac joints are within normal limits. Clips are noted within the right upper quadrant, reflecting prior cholecystectomy. IMPRESSION: No evidence of fracture or subluxation along the lumbar spine. Electronically Signed   By: Garald Balding M.D.   On: 09/18/2015 02:19   Dg Hip Unilat W Or Wo Pelvis 2-3 Views Right  Result Date: 09/18/2015 CLINICAL DATA:  Status post fall, landing on right side, with right hip pain. Initial encounter. EXAM: DG HIP (WITH OR WITHOUT PELVIS) 2-3V RIGHT COMPARISON:  None. FINDINGS: There is no evidence of fracture or dislocation. The patient's right hip arthroplasty is grossly unremarkable in appearance, without evidence of loosening. Underlying screws are noted along the right acetabulum. Both femoral heads are seated normally within their respective acetabula. The proximal right femur appears intact. No significant degenerative change is appreciated. The sacroiliac joints are unremarkable in appearance. The visualized bowel gas pattern is grossly unremarkable in appearance. IMPRESSION: No evidence of fracture or dislocation. Right hip arthroplasty is grossly intact, without evidence of loosening. Electronically Signed   By: Garald Balding M.D.   On: 09/18/2015 02:16    Signed:  [name]  MD, [place], [place], [place], CPE   09/23/2015, 4:01 PM

## 2015-09-23 NOTE — Discharge Instructions (Signed)

## 2015-09-23 NOTE — Telephone Encounter (Signed)
Advised per Dr. Doreene Burke for patient to come to the [facility] for treatment for acute pain. Patient notified and advised to come in for treatment and to bring his oxygen tank. He says his tank has not been filled. He will come in.

## 2015-09-23 NOTE — H&P (Signed)
[facility] History and Physical  [name] [id number] DOB: Apr 12, 1980 DOA: 09/23/2015  PCP: [name], MD   Chief Complaint: No chief complaint on file.   HPI: [name] is a 35 y.o. male with history of sickle cell disease, acute chest syndrome, DVT, PE on Xarelto, pulmonary hypertension, cor pulmonale, chronic respiratory failure on home oxygen, nonadherent with oxygen use and medications for SCD except the narcotics. He presented to the day hospital today complaining of pain in bilateral ribs and back. Rating pain intensity at 9/10. Thinks he is running a fever, states "I was sweating all night". Patient denies chest pain, shortness of breath, difficulty breathing.Patient denies N/V/D and abdominal pain.Patient states he has taken his home pain medication without improvement for several days.  Systemic Review: General: The patient denies anorexia, weight loss Cardiac: Denies chest pain, syncope, palpitations, pedal edema  Respiratory: Denies cough, shortness of breath, wheezing GI: Denies severe indigestion/heartburn, abdominal pain, nausea, vomiting, diarrhea and constipation GU: Denies hematuria, incontinence, dysuria  Musculoskeletal: Denies arthritis  Skin: Denies suspicious skin lesions Neurologic: Denies focal weakness or numbness, change in vision  Past Medical History:  Diagnosis Date  . Acute chest syndrome ([place]) 06/18/2013  . Acute embolism and thrombosis of right internal jugular vein ([place])   . Alcohol consumption of one to four drinks per day   . Avascular necrosis (HCC)    Right Hip  . Blood transfusion   . Chronic anticoagulation   . Demand ischemia ([place]) 01/02/2014  . Former smoker   . Functional asplenia   . Hb-SS disease with crisis ([place])   . History of Clostridium difficile infection   . History of pulmonary embolus (PE)   . Hypertension   . Hypokalemia   . Leukocytosis    Chronic  . Mood disorder ([place])   .  Noncompliance with medication regimen   . Oxygen deficiency   . Pulmonary hypertension ([place])   . Second hand tobacco smoke exposure   . Sickle cell anemia (HCC)   . Sickle-cell crisis with associated acute chest syndrome ([name]) 05/13/2013  . Thrombocytosis (HCC)    Chronic  . Uses marijuana     Past Surgical History:  Procedure Laterality Date  . CHOLECYSTECTOMY     01/2008  . Excision of left periauricular cyst     10/2009  . Excision of right ear lobe cyst with primary closur     11/2007  . Porta cath placement    . Porta cath removal    . PORTACATH PLACEMENT  01/05/2012   Procedure: INSERTION PORT-A-CATH;  Surgeon: Odis Hollingshead, MD;  Location: [facility];  Service: General;  Laterality: N/A;  ultrasound guiced port a cath insertion with fluoroscopy  . Right hip replacement     08/2006  . UMBILICAL HERNIA REPAIR     01/2008    No Known Allergies  Family History  Problem Relation Age of Onset  . Sickle cell trait Mother   . Depression Mother   . Diabetes Mother   . Sickle cell trait Father   . Sickle cell trait Brother       Prior to Admission medications   Medication Sig Start Date End Date Taking? Authorizing Provider  ambrisentan (LETAIRIS) 5 MG tablet Take 5 mg by mouth at bedtime.     Historical Provider, MD  aspirin 81 MG chewable tablet Chew 1 tablet (81 mg total) by mouth daily. 07/23/14   [name], MD  Cholecalciferol (VITAMIN  D) 2000 units tablet Take 1 tablet (2,000 Units total) by mouth daily. 02/23/15   Dorena Dew, FNP  Deferasirox (JADENU) 360 MG TABS Take 1,080 mg by mouth daily.     Historical Provider, MD  folic acid (FOLVITE) 1 MG tablet Take 1 tablet (1 mg total) by mouth every morning. 02/23/15   Dorena Dew, FNP  gabapentin (NEURONTIN) 300 MG capsule Take 1 capsule (300 mg total) by mouth 3 (three) times daily. 06/01/15   Tresa Garter, MD  HYDROmorphone (DILAUDID) 4 MG tablet Take 1 tablet (4 mg total) by mouth every 4 (four)  hours as needed for severe pain. 09/04/15   Tresa Garter, MD  lisinopril (PRINIVIL,ZESTRIL) 5 MG tablet Take 1 tablet (5 mg total) by mouth daily. 08/04/15   [name], MD  morphine (MS CONTIN) 30 MG 12 hr tablet Take 1 tablet (30 mg total) by mouth every 12 (twelve) hours. 09/07/15   Tresa Garter, MD  potassium chloride SA (K-DUR,KLOR-CON) 20 MEQ tablet Take 1 tablet (20 mEq total) by mouth every morning. Patient not taking: Reported on 08/28/2015 04/13/15   Dorena Dew, FNP  rivaroxaban (XARELTO) 20 MG TABS tablet Take 20 mg by mouth daily with supper.    Historical Provider, MD  torsemide (DEMADEX) 20 MG tablet Take 20 mg by mouth daily as needed. For increase in patient weight greater than 3 lbs. 07/27/15 07/26/16  Historical Provider, MD  zolpidem (AMBIEN) 10 MG tablet Take 1 tablet (10 mg total) by mouth at bedtime as needed for sleep. 09/07/15   Tresa Garter, MD     Physical Exam: There were no vitals filed for this visit.  General: Alert, awake, afebrile, anicteric, not in obvious distress HEENT: Normocephalic and Atraumatic, Mucous membranes pink                PERRLA; EOM intact; No scleral icterus,                 Nares: Patent, Oropharynx: Clear, Fair Dentition                 Neck: FROM, no cervical lymphadenopathy, thyromegaly, carotid bruit or JVD;  CHEST WALL: No tenderness  CHEST: Normal respiration, clear to auscultation bilaterally  HEART: Regular rate and rhythm; no murmurs rubs or gallops  BACK: No kyphosis or scoliosis; no CVA tenderness  ABDOMEN: Positive Bowel Sounds, soft, non-tender; no masses, no organomegaly EXTREMITIES: No cyanosis, clubbing, or edema SKIN:  no rash or ulceration  CNS: Alert and Oriented x 4, Nonfocal exam, CN 2-12 intact  Labs on Admission:  Basic Metabolic Panel:  Recent Labs Lab 09/18/15 0239 09/18/15 0629  NA 136 136  K 4.8 4.8  CL 110 109  CO2 21* 22  GLUCOSE 100* 104*  BUN 22* 18  CREATININE 0.89 0.91   CALCIUM 8.7* 8.8*   Liver Function Tests:  Recent Labs Lab 09/18/15 0239 09/18/15 0629  AST 35 38  ALT 21 20  ALKPHOS 81 79  BILITOT 3.2* 3.1*  PROT 7.5 7.7  ALBUMIN 4.5 4.5   No results for input(s): LIPASE, AMYLASE in the last 168 hours. No results for input(s): AMMONIA in the last 168 hours. CBC:  Recent Labs Lab 09/18/15 0239 09/18/15 0629 09/21/15 1542  WBC 16.7* 16.6* 11.1*  NEUTROABS 12.4* 12.0* 7,770  HGB 6.7* 6.7* 6.3*  HCT 20.3* 20.3* 19.8*  MCV 91.9 90.6 91.2  PLT 469* 456* 470*   Cardiac Enzymes: No  results for input(s): CKTOTAL, CKMB, CKMBINDEX, TROPONINI in the last 168 hours.  BNP (last 3 results)  Recent Labs  11/30/14 0910 08/02/15 2307  BNP 639.7* 503.2*    ProBNP (last 3 results) No results for input(s): PROBNP in the last 8760 hours.  CBG: No results for input(s): GLUCAP in the last 168 hours.   Assessment/Plan Active Problems:   Sickle cell anemia with pain ([place])   Admits to the Day Hospital, Labs, CXR  IVF D5 .45% Saline @ 100 mls/hour  Weight based Dilaudid PCA started within 30 minutes of admission  IV Toradol 30 mg Q 6 H  Monitor vitals very closely, Re-evaluate pain scale every hour  2 L of Oxygen by [place]  Patient will be re-evaluated for pain in the context of function and relationship to baseline as care progresses.  If no significant relieve from pain (remains above 5/10) will transfer patient to inpatient services for further evaluation and management  Code Status: Full  Family Communication: None  DVT Prophylaxis: Ambulate as tolerated   Time spent: 67 Minutes  Kechia Yahnke, MD, MHA, FACP, FAAP, CPE  If 7PM-7AM, please contact night-coverage [email or web address] 09/23/2015, 9:36 AM

## 2015-09-26 ENCOUNTER — Encounter (HOSPITAL_COMMUNITY): Payer: Self-pay | Admitting: Emergency Medicine

## 2015-09-26 ENCOUNTER — Emergency Department (HOSPITAL_COMMUNITY)
Admission: EM | Admit: 2015-09-26 | Discharge: 2015-09-26 | Disposition: A | Discharge status: Home / Self Care | Payer: Medicare Other | Attending: Emergency Medicine | Admitting: Emergency Medicine

## 2015-09-26 ENCOUNTER — Emergency Department (HOSPITAL_COMMUNITY): Payer: Medicare Other

## 2015-09-26 DIAGNOSIS — Z79899 Other long term (current) drug therapy: Secondary | ICD-10-CM | POA: Diagnosis not present

## 2015-09-26 DIAGNOSIS — D5700 Hb-SS disease with crisis, unspecified: Secondary | ICD-10-CM | POA: Diagnosis not present

## 2015-09-26 DIAGNOSIS — Z87891 Personal history of nicotine dependence: Secondary | ICD-10-CM | POA: Insufficient documentation

## 2015-09-26 DIAGNOSIS — Z7982 Long term (current) use of aspirin: Secondary | ICD-10-CM | POA: Diagnosis not present

## 2015-09-26 DIAGNOSIS — I10 Essential (primary) hypertension: Secondary | ICD-10-CM | POA: Diagnosis not present

## 2015-09-26 DIAGNOSIS — R0781 Pleurodynia: Secondary | ICD-10-CM | POA: Diagnosis not present

## 2015-09-26 LAB — CBC WITH DIFFERENTIAL/PLATELET
Basophils Absolute: 0.1 10*3/uL (ref 0.0–0.1)
Basophils Relative: 1 %
Eosinophils Absolute: 0.4 10*3/uL (ref 0.0–0.7)
Eosinophils Relative: 3 %
HEMATOCRIT: 21.7 % — AB (ref 39.0–52.0)
HEMOGLOBIN: 7.2 g/dL — AB (ref 13.0–17.0)
LYMPHS ABS: 1.6 10*3/uL (ref 0.7–4.0)
LYMPHS PCT: 11 %
MCH: 30 pg (ref 26.0–34.0)
MCHC: 33.2 g/dL (ref 30.0–36.0)
MCV: 90.4 fL (ref 78.0–100.0)
MONO ABS: 1.8 10*3/uL — AB (ref 0.1–1.0)
MONOS PCT: 12 %
NEUTROS ABS: 11.6 10*3/uL — AB (ref 1.7–7.7)
NEUTROS PCT: 75 %
Platelets: 575 10*3/uL — ABNORMAL HIGH (ref 150–400)
RBC: 2.4 MIL/uL — ABNORMAL LOW (ref 4.22–5.81)
RDW: 19.9 % — AB (ref 11.5–15.5)
WBC: 15.6 10*3/uL — ABNORMAL HIGH (ref 4.0–10.5)

## 2015-09-26 LAB — COMPREHENSIVE METABOLIC PANEL
ALBUMIN: 4.6 g/dL (ref 3.5–5.0)
ALK PHOS: 76 U/L (ref 38–126)
ALT: 15 U/L — ABNORMAL LOW (ref 17–63)
ANION GAP: 5 (ref 5–15)
AST: 27 U/L (ref 15–41)
BUN: 14 mg/dL (ref 6–20)
CALCIUM: 9.1 mg/dL (ref 8.9–10.3)
CHLORIDE: 107 mmol/L (ref 101–111)
CO2: 23 mmol/L (ref 22–32)
Creatinine, Ser: 0.89 mg/dL (ref 0.61–1.24)
GFR calc non Af Amer: >60 mL/min (ref >60)
GLUCOSE: 106 mg/dL — AB (ref 65–99)
Potassium: 3.9 mmol/L (ref 3.5–5.1)
SODIUM: 135 mmol/L (ref 135–145)
Total Bilirubin: 3.2 mg/dL — ABNORMAL HIGH (ref 0.3–1.2)
Total Protein: 8 g/dL (ref 6.5–8.1)

## 2015-09-26 LAB — RETICULOCYTES
RBC.: 2.4 MIL/uL — AB (ref 4.22–5.81)
Retic Count, Absolute: 115.2 10*3/uL (ref 19.0–186.0)
Retic Ct Pct: 4.8 % — ABNORMAL HIGH (ref 0.4–3.1)

## 2015-09-26 MED ORDER — HYDROMORPHONE HCL 4 MG PO TABS: 4.0000 mg | ORAL_TABLET | ORAL | 0 refills | Status: DC | PRN

## 2015-09-26 MED ORDER — KETOROLAC TROMETHAMINE 30 MG/ML IJ SOLN
30.0000 mg | INTRAMUSCULAR | Status: AC
  Administered 2015-09-26: 30 mg via INTRAVENOUS
  Filled 2015-09-26: qty 1

## 2015-09-26 MED ORDER — DIPHENHYDRAMINE HCL 50 MG/ML IJ SOLN
25.0000 mg | Freq: Once | INTRAMUSCULAR | Status: AC
  Administered 2015-09-26: 25 mg via INTRAVENOUS
  Filled 2015-09-26: qty 1

## 2015-09-26 MED ORDER — HYDROMORPHONE HCL 2 MG/ML IJ SOLN: 0.0250 mg/kg | INTRAMUSCULAR | Status: AC

## 2015-09-26 MED ORDER — HYDROMORPHONE HCL 2 MG/ML IJ SOLN
0.0250 mg/kg | INTRAMUSCULAR | Status: AC
  Administered 2015-09-26: 2 mg via INTRAVENOUS
  Filled 2015-09-26: qty 1

## 2015-09-26 MED ORDER — PROMETHAZINE HCL 25 MG PO TABS
25.0000 mg | ORAL_TABLET | ORAL | Status: DC | PRN
  Administered 2015-09-26: 25 mg via ORAL
  Filled 2015-09-26: qty 1

## 2015-09-26 MED ORDER — HYDROMORPHONE HCL 2 MG/ML IJ SOLN
2.0000 mg | Freq: Once | INTRAMUSCULAR | Status: AC
  Administered 2015-09-26: 2 mg via INTRAVENOUS
  Filled 2015-09-26: qty 1

## 2015-09-26 MED ORDER — MORPHINE SULFATE 30 MG PO TABS: 30.0000 mg | ORAL_TABLET | Freq: Two times a day (BID) | ORAL | 0 refills | Status: DC

## 2015-09-26 MED ORDER — HEPARIN SOD (PORK) LOCK FLUSH 100 UNIT/ML IV SOLN
500.0000 [IU] | Freq: Once | INTRAVENOUS | Status: AC
  Administered 2015-09-26: 500 [IU]
  Filled 2015-09-26: qty 5

## 2015-09-26 MED ORDER — SODIUM CHLORIDE 0.45 % IV SOLN
INTRAVENOUS | Status: DC
Start: 2015-09-26 — End: 2015-09-26
  Administered 2015-09-26: 04:00:00 via INTRAVENOUS

## 2015-09-26 MED ORDER — DIPHENHYDRAMINE HCL 50 MG/ML IJ SOLN
12.5000 mg | Freq: Once | INTRAMUSCULAR | Status: AC
  Administered 2015-09-26: 12.5 mg via INTRAVENOUS
  Filled 2015-09-26: qty 1

## 2015-09-26 NOTE — ED Provider Notes (Signed)
[facility] DEPT Provider Note   CSN: [id number] Arrival date & time: 09/26/15  0027     History   Chief Complaint Chief Complaint  Patient presents with  . Sickle Cell Pain Crisis    HPI [name] is a 35 y.o. male.  HPI  35 y.o. male with a hx of Sickle Cell Anemia, HTN, HLD, presents to the Emergency Department today complaining of bilateral rib pain with onset today. Noted back pain as well. States that this is typical for his pain. No CP/SOB. No N/V. No ABD pain. No diaphoresis. Attempted home medications without relief. Notes pain is 8/10 and bilateral rib cage. Recent seen and admitted for same with similar presentation. No new onset of symptoms. No fevers. No chills. No other symptoms noted.   Past Medical History:  Diagnosis Date  . Acute chest syndrome ([place]) 06/18/2013  . Acute embolism and thrombosis of right internal jugular vein ([place])   . Alcohol consumption of one to four drinks per day   . Avascular necrosis (HCC)    Right Hip  . Blood transfusion   . Chronic anticoagulation   . Demand ischemia ([place]) 01/02/2014  . Former smoker   . Functional asplenia   . Hb-SS disease with crisis ([place])   . History of Clostridium difficile infection   . History of pulmonary embolus (PE)   . Hypertension   . Hypokalemia   . Leukocytosis    Chronic  . Mood disorder ([place])   . Noncompliance with medication regimen   . Oxygen deficiency   . Pulmonary hypertension ([place])   . Second hand tobacco smoke exposure   . Sickle cell anemia (HCC)   . Sickle-cell crisis with associated acute chest syndrome ([place]) 05/13/2013  . Thrombocytosis (HCC)    Chronic  . Uses marijuana     Patient Active Problem List   Diagnosis Date Noted  . Sickle cell pain crisis ([place]) 09/18/2015  . Sickle cell anemia with crisis ([place]) 09/04/2015  . Shortness of breath   . Pulmonary embolism without acute cor pulmonale ([place])   . Fever   . Sickle cell anemia with pain ([place]) 05/07/2015  .  Hypoxia   . Anemia 01/31/2015  . Symptomatic anemia   . Hb-SS disease without crisis ([place]) 10/07/2014  . PVC's (premature ventricular contractions) 09/10/2014  . Abnormal EKG 09/10/2014  . Chronic atrial fibrillation ([place]) 09/09/2014  . Cor pulmonale, chronic ([place]) 08/25/2014  . Sickle cell anemia ([place]) 06/25/2014  . Anemia of chronic disease 06/25/2014  . PAH (pulmonary artery hypertension) ([place]) 03/18/2014  . Hb-SS disease with crisis ([place]) 01/22/2014  . Paralytic strabismus, external ophthalmoplegia   . Chronic pain syndrome 12/12/2013  . Chronic anticoagulation 08/22/2013  . Essential hypertension 08/22/2013  . Pulmonary HTN ([place]) 06/18/2013  . Functional asplenia   . Vitamin D deficiency 02/13/2013  . Hx of pulmonary embolus 06/29/2012  . Hemochromatosis 12/14/2011  . Avascular necrosis [facility])     Past Surgical History:  Procedure Laterality Date  . CHOLECYSTECTOMY     01/2008  . Excision of left periauricular cyst     10/2009  . Excision of right ear lobe cyst with primary closur     11/2007  . Porta cath placement    . Porta cath removal    . PORTACATH PLACEMENT  01/05/2012   Procedure: INSERTION PORT-A-CATH;  Surgeon: Odis Hollingshead, MD;  Location: [facility];  Service: General;  Laterality: N/A;  ultrasound guiced port a cath insertion with  fluoroscopy  . Right hip replacement     08/2006  . UMBILICAL HERNIA REPAIR     01/2008       Home Medications    Prior to Admission medications   Medication Sig Start Date End Date Taking? Authorizing Provider  ambrisentan (LETAIRIS) 5 MG tablet Take 5 mg by mouth at bedtime.     Historical Provider, MD  aspirin 81 MG chewable tablet Chew 1 tablet (81 mg total) by mouth daily. 07/23/14   [name], MD  Cholecalciferol (VITAMIN D) 2000 units tablet Take 1 tablet (2,000 Units total) by mouth daily. 02/23/15   Dorena Dew, FNP  Deferasirox (JADENU) 360 MG TABS Take 1,080 mg by mouth daily.     Historical Provider,  MD  folic acid (FOLVITE) 1 MG tablet Take 1 tablet (1 mg total) by mouth every morning. 02/23/15   Dorena Dew, FNP  gabapentin (NEURONTIN) 300 MG capsule Take 1 capsule (300 mg total) by mouth 3 (three) times daily. 06/01/15   Tresa Garter, MD  HYDROmorphone (DILAUDID) 4 MG tablet Take 1 tablet (4 mg total) by mouth every 4 (four) hours as needed for severe pain. 09/04/15   Tresa Garter, MD  lisinopril (PRINIVIL,ZESTRIL) 5 MG tablet Take 1 tablet (5 mg total) by mouth daily. 08/04/15   [name], MD  morphine (MS CONTIN) 30 MG 12 hr tablet Take 1 tablet (30 mg total) by mouth every 12 (twelve) hours. 09/07/15   Tresa Garter, MD  potassium chloride SA (K-DUR,KLOR-CON) 20 MEQ tablet Take 1 tablet (20 mEq total) by mouth every morning. 04/13/15   Dorena Dew, FNP  rivaroxaban (XARELTO) 20 MG TABS tablet Take 20 mg by mouth daily with supper.    Historical Provider, MD  torsemide (DEMADEX) 20 MG tablet Take 20 mg by mouth daily as needed. For increase in patient weight greater than 3 lbs. 07/27/15 07/26/16  Historical Provider, MD  zolpidem (AMBIEN) 10 MG tablet Take 1 tablet (10 mg total) by mouth at bedtime as needed for sleep. 09/07/15   Tresa Garter, MD    Family History Family History  Problem Relation Age of Onset  . Sickle cell trait Mother   . Depression Mother   . Diabetes Mother   . Sickle cell trait Father   . Sickle cell trait Brother     Social History Social History  Substance Use Topics  . Smoking status: Former Smoker    Packs/day: 0.50    Years: 10.00    Types: Cigarettes    Quit date: 05/29/2011  . Smokeless tobacco: Never Used  . Alcohol use No     Allergies   Review of patient's allergies indicates no known allergies.   Review of Systems Review of Systems ROS reviewed and all are negative for acute change except as noted in the HPI.  Physical Exam Updated Vital Signs BP 118/79 (BP Location: Left Arm)   Pulse 98   Temp  98.5 F (36.9 C) (Oral)   Resp 18   Ht 6' (1.829 m)   Wt 78.5 kg   SpO2 100%   BMI 23.46 kg/m   Physical Exam  Constitutional: He is oriented to person, place, and time. Vital signs are normal. He appears well-developed and well-nourished.  Pt resting comfortably and using phone  HENT:  Head: Normocephalic and atraumatic.  Right Ear: Hearing normal.  Left Ear: Hearing normal.  Eyes: Conjunctivae and EOM are normal. Pupils are equal,  round, and reactive to light.  Neck: Normal range of motion. Neck supple.  Cardiovascular: Normal rate, regular rhythm, normal heart sounds and intact distal pulses.   Pulmonary/Chest: Effort normal and breath sounds normal.  Abdominal: Soft. There is no tenderness.  Musculoskeletal: Normal range of motion.  Neurological: He is alert and oriented to person, place, and time.  Skin: Skin is warm and dry.  Psychiatric: He has a normal mood and affect. His speech is normal and behavior is normal. Thought content normal.  Nursing note and vitals reviewed.  ED Treatments / Results  Labs (all labs ordered are listed, but only abnormal results are displayed) Labs Reviewed  COMPREHENSIVE METABOLIC PANEL - Abnormal; Notable for the following:       Result Value   Glucose, Bld 106 (*)    ALT 15 (*)    Total Bilirubin 3.2 (*)    All other components within normal limits  CBC WITH DIFFERENTIAL/PLATELET - Abnormal; Notable for the following:    WBC 15.6 (*)    RBC 2.40 (*)    Hemoglobin 7.2 (*)    HCT 21.7 (*)    RDW 19.9 (*)    Platelets 575 (*)    Neutro Abs 11.6 (*)    Monocytes Absolute 1.8 (*)    All other components within normal limits  RETICULOCYTES - Abnormal; Notable for the following:    Retic Ct Pct 4.8 (*)    RBC. 2.40 (*)    All other components within normal limits   EKG  EKG Interpretation None      Radiology Dg Chest 2 View  Result Date: 09/26/2015 CLINICAL DATA:  Bilateral rib pain radiating to back for 2 days. History of  hypertension and sickle cell. EXAM: CHEST  2 VIEW COMPARISON:  Chest radiograph September 23, 2015 FINDINGS: The cardiac silhouette is moderately enlarged and unchanged. Mediastinal silhouette is nonsuspicious. Stable mild bibasilar strandy densities, unchanged. No pleural effusion or focal consolidation. No pneumothorax. Single lumen LEFT chest Port-A-Cath with distal tip projecting cavoatrial junction. No pneumothorax. Soft tissue planes included osseous structures are nonsuspicious. Surgical clips in the included right abdomen compatible with cholecystectomy. IMPRESSION: Stable cardiomegaly.  No acute pulmonary process. Electronically Signed   By: Elon Alas M.D.   On: 09/26/2015 04:21    Procedures Procedures (including critical care time)  Medications Ordered in ED Medications - No data to display   Initial Impression / Assessment and Plan / ED Course  I have reviewed the triage vital signs and the nursing notes.  Pertinent labs & imaging results that were available during my care of the patient were reviewed by me and considered in my medical decision making (see chart for details).  Clinical Course    Final Clinical Impressions(s) / ED Diagnoses  I have reviewed and evaluated the relevant laboratory values I have reviewed and evaluated the relevant imaging studies. I have reviewed the relevant previous healthcare records. I obtained HPI from historian. Patient discussed with supervising physician  ED Course:  Assessment: Pt is a 35yM with hx Sickle Cell Anemia who presents with sickle cell pain with bilateral rib cage pain and back pain, which is typical for him. No N/V. No CP/SOB/ABD pain. No fevers. Recently admitted and seen for same at Day clinic. On exam, pt in NAD. Nontoxic/nonseptic appearing. VSS. Afebrile. Lungs CTA. Heart RRR. Abdomen nontender soft. Pt playing on phone and resting comfortably.. Labs reassuring and unremarkable. CXR unreamrakble. Given fluids and  analgesia in ED. Plan is  to DC home with follow up to PCP. Given Rx of pain medications as pt states he ran out and does not have follow up until next week. I have reviewed the [place] Controlled Substance Reporting System. I do not feel that this patient requires further inpatient treatment and is adequate for outpatient follow up for pain control. At time of discharge, Patient is in no acute distress. Vital Signs are stable. Patient is able to ambulate. Patient able to tolerate PO.    Disposition/Plan:  DC Home Additional Verbal discharge instructions given and discussed with patient.  Pt Instructed to f/u with PCP in the next week for evaluation and treatment of symptoms. Return precautions given Pt acknowledges and agrees with plan  Supervising Physician Jola Schmidt, MD   Final diagnoses:  Sickle cell pain crisis [facility])    New Prescriptions New Prescriptions   No medications on file     Shary Decamp, PA-C 09/26/15 [id number]    Jola Schmidt, MD 09/26/15 [phone]

## 2015-09-26 NOTE — ED Triage Notes (Signed)
Pt complains of bilateral rib pain that radiates to the back. Typical for his sickle cell pain. Pain started today. Pt rates pain 8/10. Pt stated he took his dilaudid around 2200 with minimal relief. Pt denies chest pain, lightheadedness, dizziness, and SOB.

## 2015-09-26 NOTE — Discharge Instructions (Signed)
Please read and follow all provided instructions.  Your diagnoses today include:  1. Sickle cell pain crisis ([place])     Tests performed today include: Vital signs. See below for your results today.   Medications prescribed:  Take as prescribed   Home care instructions:  Follow any educational materials contained in this packet.  Follow-up instructions: Please follow-up with your primary care provider for further evaluation of symptoms and treatment   Return instructions:  Please return to the Emergency Department if you do not get better, if you get worse, or new symptoms OR  - Fever (temperature greater than 101.89F)  - Bleeding that does not stop with holding pressure to the area    -Severe pain (please note that you may be more sore the day after your accident)  - Chest Pain  - Difficulty breathing  - Severe nausea or vomiting  - Inability to tolerate food and liquids  - Passing out  - Skin becoming red around your wounds  - Change in mental status (confusion or lethargy)  - New numbness or weakness    Please return if you have any other emergent concerns.  Additional Information:  Your vital signs today were: BP 91/69    Pulse 66    Temp 98.5 F (36.9 C) (Oral)    Resp 18    Ht 6' (1.829 m)    Wt 78.5 kg    SpO2 96%    BMI 23.46 kg/m  If your blood pressure (BP) was elevated above 135/85 this visit, please have this repeated by your doctor within one month. ---------------

## 2015-09-28 ENCOUNTER — Encounter (HOSPITAL_COMMUNITY): Payer: Self-pay | Admitting: *Deleted

## 2015-09-28 ENCOUNTER — Emergency Department (HOSPITAL_COMMUNITY)
Admission: EM | Admit: 2015-09-28 | Discharge: 2015-09-28 | Disposition: A | Discharge status: Home / Self Care | Payer: Medicare Other | Attending: Emergency Medicine | Admitting: Emergency Medicine

## 2015-09-28 ENCOUNTER — Encounter (HOSPITAL_COMMUNITY): Payer: Self-pay

## 2015-09-28 ENCOUNTER — Non-Acute Institutional Stay (HOSPITAL_BASED_OUTPATIENT_CLINIC_OR_DEPARTMENT_OTHER)
Admission: AD | Admit: 2015-09-28 | Discharge: 2015-09-28 | Disposition: A | Discharge status: Home / Self Care | Payer: Medicare Other | Source: Ambulatory Visit | Attending: Internal Medicine | Admitting: Internal Medicine

## 2015-09-28 DIAGNOSIS — Z7982 Long term (current) use of aspirin: Secondary | ICD-10-CM | POA: Insufficient documentation

## 2015-09-28 DIAGNOSIS — Z7901 Long term (current) use of anticoagulants: Secondary | ICD-10-CM

## 2015-09-28 DIAGNOSIS — Z87891 Personal history of nicotine dependence: Secondary | ICD-10-CM | POA: Insufficient documentation

## 2015-09-28 DIAGNOSIS — Z79899 Other long term (current) drug therapy: Secondary | ICD-10-CM | POA: Insufficient documentation

## 2015-09-28 DIAGNOSIS — I10 Essential (primary) hypertension: Secondary | ICD-10-CM | POA: Insufficient documentation

## 2015-09-28 DIAGNOSIS — Z86718 Personal history of other venous thrombosis and embolism: Secondary | ICD-10-CM | POA: Insufficient documentation

## 2015-09-28 DIAGNOSIS — Z9981 Dependence on supplemental oxygen: Secondary | ICD-10-CM | POA: Insufficient documentation

## 2015-09-28 DIAGNOSIS — Z96641 Presence of right artificial hip joint: Secondary | ICD-10-CM

## 2015-09-28 DIAGNOSIS — F1290 Cannabis use, unspecified, uncomplicated: Secondary | ICD-10-CM | POA: Insufficient documentation

## 2015-09-28 DIAGNOSIS — R109 Unspecified abdominal pain: Secondary | ICD-10-CM | POA: Diagnosis present

## 2015-09-28 DIAGNOSIS — D5700 Hb-SS disease with crisis, unspecified: Secondary | ICD-10-CM

## 2015-09-28 DIAGNOSIS — Z86711 Personal history of pulmonary embolism: Secondary | ICD-10-CM | POA: Insufficient documentation

## 2015-09-28 DIAGNOSIS — R079 Chest pain, unspecified: Secondary | ICD-10-CM | POA: Diagnosis not present

## 2015-09-28 LAB — CBC WITH DIFFERENTIAL/PLATELET
BASOS ABS: 0.1 10*3/uL (ref 0.0–0.1)
Basophils Relative: 1 %
EOS PCT: 2 %
Eosinophils Absolute: 0.3 10*3/uL (ref 0.0–0.7)
HEMATOCRIT: 20.7 % — AB (ref 39.0–52.0)
HEMOGLOBIN: 7 g/dL — AB (ref 13.0–17.0)
LYMPHS PCT: 10 %
Lymphs Abs: 1.3 10*3/uL (ref 0.7–4.0)
MCH: 31 pg (ref 26.0–34.0)
MCHC: 33.8 g/dL (ref 30.0–36.0)
MCV: 91.6 fL (ref 78.0–100.0)
MONOS PCT: 12 %
Monocytes Absolute: 1.5 10*3/uL — ABNORMAL HIGH (ref 0.1–1.0)
NEUTROS PCT: 75 %
Neutro Abs: 9.7 10*3/uL — ABNORMAL HIGH (ref 1.7–7.7)
Platelets: 564 10*3/uL — ABNORMAL HIGH (ref 150–400)
RBC: 2.26 MIL/uL — AB (ref 4.22–5.81)
RDW: 20.3 % — AB (ref 11.5–15.5)
WBC: 12.9 10*3/uL — AB (ref 4.0–10.5)

## 2015-09-28 LAB — COMPREHENSIVE METABOLIC PANEL
ALBUMIN: 4.3 g/dL (ref 3.5–5.0)
ALT: 14 U/L — AB (ref 17–63)
AST: 24 U/L (ref 15–41)
Alkaline Phosphatase: 74 U/L (ref 38–126)
Anion gap: 6 (ref 5–15)
BUN: 10 mg/dL (ref 6–20)
CHLORIDE: 107 mmol/L (ref 101–111)
CO2: 24 mmol/L (ref 22–32)
CREATININE: 0.65 mg/dL (ref 0.61–1.24)
Calcium: 9.1 mg/dL (ref 8.9–10.3)
GFR calc Af Amer: >60 mL/min (ref >60)
GFR calc non Af Amer: >60 mL/min (ref >60)
Glucose, Bld: 96 mg/dL (ref 65–99)
Potassium: 3.8 mmol/L (ref 3.5–5.1)
SODIUM: 137 mmol/L (ref 135–145)
TOTAL PROTEIN: 7.7 g/dL (ref 6.5–8.1)
Total Bilirubin: 2.9 mg/dL — ABNORMAL HIGH (ref 0.3–1.2)

## 2015-09-28 LAB — URINALYSIS, ROUTINE W REFLEX MICROSCOPIC
Bilirubin Urine: NEGATIVE
GLUCOSE, UA: NEGATIVE mg/dL
Hgb urine dipstick: NEGATIVE
KETONES UR: NEGATIVE mg/dL
LEUKOCYTES UA: NEGATIVE
NITRITE: NEGATIVE
PH: 7 (ref 5.0–8.0)
PROTEIN: NEGATIVE mg/dL
Specific Gravity, Urine: 1.012 (ref 1.005–1.030)

## 2015-09-28 MED ORDER — KETOROLAC TROMETHAMINE 30 MG/ML IJ SOLN
30.0000 mg | Freq: Four times a day (QID) | INTRAMUSCULAR | Status: DC
  Administered 2015-09-28: 30 mg via INTRAVENOUS
  Filled 2015-09-28: qty 1

## 2015-09-28 MED ORDER — NALOXONE HCL 0.4 MG/ML IJ SOLN: 0.4000 mg | INTRAMUSCULAR | Status: DC | PRN

## 2015-09-28 MED ORDER — ONDANSETRON HCL 4 MG/2ML IJ SOLN: 4.0000 mg | Freq: Four times a day (QID) | INTRAMUSCULAR | Status: DC | PRN

## 2015-09-28 MED ORDER — SODIUM CHLORIDE 0.9% FLUSH
10.0000 mL | INTRAVENOUS | Status: AC | PRN
  Administered 2015-09-28: 10 mL

## 2015-09-28 MED ORDER — SODIUM CHLORIDE 0.9% FLUSH: 9.0000 mL | INTRAVENOUS | Status: DC | PRN

## 2015-09-28 MED ORDER — SODIUM CHLORIDE 0.9 % IV SOLN
25.0000 mg | INTRAVENOUS | Status: DC | PRN
  Filled 2015-09-28: qty 0.5

## 2015-09-28 MED ORDER — HYDROMORPHONE 1 MG/ML IV SOLN
INTRAVENOUS | Status: DC
  Administered 2015-09-28: 2.1 mg via INTRAVENOUS
  Administered 2015-09-28: 14 mg via INTRAVENOUS
  Administered 2015-09-28: 12:00:00 via INTRAVENOUS
  Filled 2015-09-28: qty 25

## 2015-09-28 MED ORDER — POLYETHYLENE GLYCOL 3350 17 G PO PACK
17.0000 g | PACK | Freq: Every day | ORAL | Status: DC | PRN
  Filled 2015-09-28: qty 1

## 2015-09-28 MED ORDER — HYDROMORPHONE HCL 2 MG/ML IJ SOLN
3.0000 mg | Freq: Once | INTRAMUSCULAR | Status: AC
  Administered 2015-09-28: 3 mg via INTRAVENOUS
  Filled 2015-09-28: qty 2

## 2015-09-28 MED ORDER — HEPARIN SOD (PORK) LOCK FLUSH 100 UNIT/ML IV SOLN
500.0000 [IU] | INTRAVENOUS | Status: AC | PRN
  Administered 2015-09-28: 500 [IU]
  Filled 2015-09-28: qty 5

## 2015-09-28 MED ORDER — SODIUM CHLORIDE 0.45 % IV SOLN
INTRAVENOUS | Status: DC
  Administered 2015-09-28: 12:00:00 via INTRAVENOUS

## 2015-09-28 MED ORDER — HYDROMORPHONE HCL 2 MG/ML IJ SOLN
0.0250 mg/kg | INTRAMUSCULAR | Status: AC
  Administered 2015-09-28: 2 mg via INTRAVENOUS
  Filled 2015-09-28: qty 1

## 2015-09-28 MED ORDER — DIPHENHYDRAMINE HCL 25 MG PO CAPS
25.0000 mg | ORAL_CAPSULE | ORAL | Status: DC | PRN
  Administered 2015-09-28: 50 mg via ORAL
  Filled 2015-09-28: qty 2

## 2015-09-28 MED ORDER — SENNOSIDES-DOCUSATE SODIUM 8.6-50 MG PO TABS: 1.0000 | ORAL_TABLET | Freq: Two times a day (BID) | ORAL | Status: DC

## 2015-09-28 MED ORDER — HYDROMORPHONE HCL 2 MG/ML IJ SOLN: 0.0250 mg/kg | INTRAMUSCULAR | Status: AC

## 2015-09-28 NOTE — H&P (Signed)
[facility] History and Physical  [name] [id number] DOB: Jan 18, 1981 DOA: 09/28/2015  PCP: [name], MD   Chief Complaint:   HPI: [name] is a 35 y.o. male with history of sickle cell disease, acute chest syndrome, DVT, PE on Xarelto, pulmonary hypertension, cor pulmonale, chronic respiratory failure on home oxygen, nonadherent with oxygen use and medications for SCD except the narcotics. He presented to the day hospital today complaining of pain in bilateral ribs, bilateral flank area and lower back. Patient was seen at the ED for the same, all works-up were reassuring, he was treated briefly for pain and transitioned to the [facility] for Extended observation and pain control. Rating pain intensity at 9/10. Patient denies chest pain, shortness of breath, difficulty breathing.Patient denies N/V/D and abdominal pain.Patient states he has taken his home pain medication without improvement before presenting to the ED. No urinary symptom.  Systemic Review: General: The patient denies anorexia, fever, weight loss Cardiac: Denies chest pain, syncope, palpitations, pedal edema  Respiratory: Denies cough, shortness of breath, wheezing GI: Denies severe indigestion/heartburn, abdominal pain, nausea, vomiting, diarrhea and constipation GU: Denies hematuria, incontinence, dysuria  Musculoskeletal: Denies arthritis  Skin: Denies suspicious skin lesions Neurologic: Denies focal weakness or numbness, change in vision  Past Medical History:  Diagnosis Date  . Acute chest syndrome ([place]) 06/18/2013  . Acute embolism and thrombosis of right internal jugular vein ([place])   . Alcohol consumption of one to four drinks per day   . Avascular necrosis (HCC)    Right Hip  . Blood transfusion   . Chronic anticoagulation   . Demand ischemia ([place]) 01/02/2014  . Former smoker   . Functional asplenia   . Hb-SS disease with crisis ([place])   . History of Clostridium  difficile infection   . History of pulmonary embolus (PE)   . Hypertension   . Hypokalemia   . Leukocytosis    Chronic  . Mood disorder ([place])   . Noncompliance with medication regimen   . Oxygen deficiency   . Pulmonary hypertension ([place])   . Second hand tobacco smoke exposure   . Sickle cell anemia (HCC)   . Sickle-cell crisis with associated acute chest syndrome ([place]) 05/13/2013  . Thrombocytosis (HCC)    Chronic  . Uses marijuana     Past Surgical History:  Procedure Laterality Date  . CHOLECYSTECTOMY     01/2008  . Excision of left periauricular cyst     10/2009  . Excision of right ear lobe cyst with primary closur     11/2007  . Porta cath placement    . Porta cath removal    . PORTACATH PLACEMENT  01/05/2012   Procedure: INSERTION PORT-A-CATH;  Surgeon: Odis Hollingshead, MD;  Location: [facility];  Service: General;  Laterality: N/A;  ultrasound guiced port a cath insertion with fluoroscopy  . Right hip replacement     08/2006  . UMBILICAL HERNIA REPAIR     01/2008    No Known Allergies  Family History  Problem Relation Age of Onset  . Sickle cell trait Mother   . Depression Mother   . Diabetes Mother   . Sickle cell trait Father   . Sickle cell trait Brother       Prior to Admission medications   Medication Sig Start Date End Date Taking? Authorizing Provider  ambrisentan (LETAIRIS) 5 MG tablet Take 5 mg by mouth at bedtime.    Yes Historical Provider, MD  aspirin 81 MG chewable tablet Chew 1 tablet (81 mg total) by mouth daily. 07/23/14  Yes [name], MD  Cholecalciferol (VITAMIN D) 2000 units tablet Take 1 tablet (2,000 Units total) by mouth daily. 02/23/15  Yes Dorena Dew, FNP  Deferasirox (JADENU) 360 MG TABS Take 1,080 mg by mouth daily.    Yes Historical Provider, MD  folic acid (FOLVITE) 1 MG tablet Take 1 tablet (1 mg total) by mouth every morning. 02/23/15  Yes Dorena Dew, FNP  gabapentin (NEURONTIN) 300 MG capsule Take 1 capsule  (300 mg total) by mouth 3 (three) times daily. 06/01/15  Yes Tresa Garter, MD  HYDROmorphone (DILAUDID) 4 MG tablet Take 1 tablet (4 mg total) by mouth every 4 (four) hours as needed for severe pain. 09/26/15  Yes Shary Decamp, PA-C  lisinopril (PRINIVIL,ZESTRIL) 5 MG tablet Take 1 tablet (5 mg total) by mouth daily. 08/04/15  Yes [name], MD  morphine (MSIR) 30 MG tablet Take 1 tablet (30 mg total) by mouth every 12 (twelve) hours. 09/26/15  Yes Shary Decamp, PA-C  rivaroxaban (XARELTO) 20 MG TABS tablet Take 20 mg by mouth daily with supper.   Yes Historical Provider, MD  zolpidem (AMBIEN) 10 MG tablet Take 1 tablet (10 mg total) by mouth at bedtime as needed for sleep. 09/07/15  Yes Tresa Garter, MD  potassium chloride SA (K-DUR,KLOR-CON) 20 MEQ tablet Take 1 tablet (20 mEq total) by mouth every morning. Patient not taking: Reported on 09/28/2015 04/13/15   Dorena Dew, FNP  torsemide (DEMADEX) 20 MG tablet Take 20 mg by mouth daily as needed. For increase in patient weight greater than 3 lbs. 07/27/15 07/26/16  Historical Provider, MD     Physical Exam: Vitals:   09/28/15 1132  BP: 94/71  Pulse: (!) 121  Resp: 20  Temp: 98.6 F (37 C)  TempSrc: Oral  SpO2: 98%  Weight: 173 lb (78.5 kg)  Height: 6' (1.829 m)    General: Alert, awake, afebrile, anicteric, not in obvious distress HEENT: Normocephalic and Atraumatic, Mucous membranes pink                PERRLA; EOM intact; No scleral icterus,                 Nares: Patent, Oropharynx: Clear, Fair Dentition                 Neck: FROM, no cervical lymphadenopathy, thyromegaly, carotid bruit or JVD;  CHEST WALL: No tenderness  CHEST: Normal respiration, clear to auscultation bilaterally  HEART: Regular rate and rhythm; no murmurs rubs or gallops  BACK: No kyphosis or scoliosis; no CVA tenderness  ABDOMEN: Positive Bowel Sounds, soft, non-tender; no masses, no organomegaly EXTREMITIES: No cyanosis, clubbing, or  edema SKIN:  no rash or ulceration  CNS: Alert and Oriented x 4, Nonfocal exam, CN 2-12 intact  Labs on Admission:  Basic Metabolic Panel:  Recent Labs Lab 09/26/15 0317 09/28/15 0814  NA 135 137  K 3.9 3.8  CL 107 107  CO2 23 24  GLUCOSE 106* 96  BUN 14 10  CREATININE 0.89 0.65  CALCIUM 9.1 9.1   Liver Function Tests:  Recent Labs Lab 09/26/15 0317 09/28/15 0814  AST 27 24  ALT 15* 14*  ALKPHOS 76 74  BILITOT 3.2* 2.9*  PROT 8.0 7.7  ALBUMIN 4.6 4.3   No results for input(s): LIPASE, AMYLASE in the last 168 hours. No results for input(s): AMMONIA  in the last 168 hours. CBC:  Recent Labs Lab 09/21/15 1542 09/23/15 0950 09/26/15 0317 09/28/15 0814  WBC 11.1* 12.0* 15.6* 12.9*  NEUTROABS 7,770 9.2* 11.6* 9.7*  HGB 6.3* 7.0* 7.2* 7.0*  HCT 19.8* 21.0* 21.7* 20.7*  MCV 91.2 89.0 90.4 91.6  PLT 470* 509* 575* 564*   Cardiac Enzymes: No results for input(s): CKTOTAL, CKMB, CKMBINDEX, TROPONINI in the last 168 hours.  BNP (last 3 results)  Recent Labs  11/30/14 0910 08/02/15 2307  BNP 639.7* 503.2*    ProBNP (last 3 results) No results for input(s): PROBNP in the last 8760 hours.  CBG: No results for input(s): GLUCAP in the last 168 hours.   Assessment/Plan Active Problems:   Sickle cell anemia with pain ([place]) Labs reviewed by me, stable  Admits to the Day Hospital  IVF 1/2 N Saline @ 100 mls/hour  Weight based Dilaudid PCA started within 30 minutes of admission  IV Toradol 30 mg Q 6 H  Monitor vitals very closely, Re-evaluate pain scale every hour  2 L of Oxygen by [place]  Patient will be re-evaluated for pain in the context of function and relationship to baseline as care progresses.  If no significant relieve from pain (remains above 5/10) will transfer patient to inpatient services for further evaluation and management  Code Status: Full  Family Communication: None  DVT Prophylaxis: Ambulate as tolerated   Time spent: 62  Minutes  Goku Harb, MD, MHA, FACP, FAAP, CPE  If 7PM-7AM, please contact night-coverage [email or web address] 09/28/2015, 11:47 AM

## 2015-09-28 NOTE — Discharge Summary (Signed)
Physician Discharge Summary  [name] [id number] DOB: 1980-12-17 DOA: 09/28/2015  PCP: [name], MD  Admit date: 09/28/2015  Discharge date: 09/28/2015  Time spent: 30 minutes  Discharge Diagnoses:  Active Problems:   Sickle cell anemia with pain [facility])   Discharge Condition: Stable  Diet recommendation: Regular  Filed Weights   09/28/15 1132  Weight: 173 lb (78.5 kg)    History of present illness:  [name] is a 35 y.o. male with history of sickle cell disease, acute chest syndrome, DVT, PE on Xarelto, pulmonary hypertension, cor pulmonale, chronic respiratory failure on home oxygen, nonadherent with oxygen use and medications for SCD except the narcotics. He presented to the day hospital today complaining of pain in bilateral ribs, bilateral flank area and lower back. Patient was seen at the ED for the same, all works-up were reassuring, he was treated briefly for pain and transitioned to the [facility] for Extended observation and pain control. Rating pain intensity at 9/10.Patient denies chest pain, shortness of breath, difficulty breathing.Patient denies N/V/D and abdominal pain.Patient states he has taken his home pain medication without improvement before presenting to the ED. No urinary symptom.  Hospital Course:  [name] was admitted to the day hospital with sickle cell pain crisis. Patient was treated with weight based IV Dilaudid PCA, IV Toradol as well as IV fluids per protocol. [name] showed some improvement but still complaining that he is not at his baseline yet. His pain only improved from 9 to 6 out of 10 at the time of discharge. He was instructed to return to the day hospital in the morning tomorrow if pain worsens or persists. Otherwise [name] will follow-up at the clinic as previously scheduled, continue with home medications as per prior to admission.  Discharge Instructions We discussed the need for good hydration,  monitoring of hydration status, avoidance of heat, cold, stress, and infection triggers. We discussed the need to be compliant with taking Hydrea. [name] was reminded of the need to seek medical attention of any symptoms of bleeding, anemia, or infection occurs.  Discharge Exam: Vitals:   09/28/15 1544 09/28/15 1626  BP: 98/65   Pulse: 84   Resp: 18 (!) 21  Temp:     General appearance: alert, cooperative and no distress Eyes: conjunctivae/corneas clear. PERRL, EOM's intact. Fundi benign. Neck: no adenopathy, no carotid bruit, no JVD, supple, symmetrical, trachea midline and thyroid not enlarged, symmetric, no tenderness/mass/nodules Back: symmetric, no curvature. ROM normal. No CVA tenderness. Resp: clear to auscultation bilaterally Chest wall: no tenderness Cardio: regular rate and rhythm, S1, S2 normal, no murmur, click, rub or gallop GI: soft, non-tender; bowel sounds normal; no masses, no organomegaly Extremities: extremities normal, atraumatic, no cyanosis or edema Pulses: 2+ and symmetric Skin: Skin color, texture, turgor normal. No rashes or lesions Neurologic: Grossly normal  Discharge Instructions    Diet - low sodium heart healthy    Complete by:  As directed   Increase activity slowly    Complete by:  As directed     Current Discharge Medication List    CONTINUE these medications which have NOT CHANGED   Details  ambrisentan (LETAIRIS) 5 MG tablet Take 5 mg by mouth at bedtime.     aspirin 81 MG chewable tablet Chew 1 tablet (81 mg total) by mouth daily. Qty: 30 tablet, Refills: 11    Cholecalciferol (VITAMIN D) 2000 units tablet Take 1 tablet (2,000 Units total) by mouth daily. Qty: 90 tablet, Refills:  3   Associated Diagnoses: Vitamin D deficiency    Deferasirox (JADENU) 360 MG TABS Take 1,080 mg by mouth daily.     folic acid (FOLVITE) 1 MG tablet Take 1 tablet (1 mg total) by mouth every morning. Qty: 30 tablet, Refills: 11   Associated Diagnoses: Hb-SS  disease without crisis ([place])    gabapentin (NEURONTIN) 300 MG capsule Take 1 capsule (300 mg total) by mouth 3 (three) times daily. Qty: 270 capsule, Refills: 3    HYDROmorphone (DILAUDID) 4 MG tablet Take 1 tablet (4 mg total) by mouth every 4 (four) hours as needed for severe pain. Qty: 12 tablet, Refills: 0    lisinopril (PRINIVIL,ZESTRIL) 5 MG tablet Take 1 tablet (5 mg total) by mouth daily. Qty: 30 tablet, Refills: 1    morphine (MSIR) 30 MG tablet Take 1 tablet (30 mg total) by mouth every 12 (twelve) hours. Qty: 6 tablet, Refills: 0    rivaroxaban (XARELTO) 20 MG TABS tablet Take 20 mg by mouth daily with supper.    zolpidem (AMBIEN) 10 MG tablet Take 1 tablet (10 mg total) by mouth at bedtime as needed for sleep. Qty: 30 tablet, Refills: 0   Associated Diagnoses: Insomnia    potassium chloride SA (K-DUR,KLOR-CON) 20 MEQ tablet Take 1 tablet (20 mEq total) by mouth every morning. Qty: 30 tablet, Refills: 3   Associated Diagnoses: Pulmonary HTN (HCC)    torsemide (DEMADEX) 20 MG tablet Take 20 mg by mouth daily as needed. For increase in patient weight greater than 3 lbs.       No Known Allergies Follow-up Information    Braydon Kullman, MD Follow up in 1 day(s).   Specialty:  Internal Medicine Why:  Return to the [facility] tomorrow (09/29/2015) morning for observation and pain control if pain gets worse or persists. Contact information: [place] [address] [phone]           Significant Diagnostic Studies: Dg Chest 2 View  Result Date: 09/26/2015 CLINICAL DATA:  Bilateral rib pain radiating to back for 2 days. History of hypertension and sickle cell. EXAM: CHEST  2 VIEW COMPARISON:  Chest radiograph September 23, 2015 FINDINGS: The cardiac silhouette is moderately enlarged and unchanged. Mediastinal silhouette is nonsuspicious. Stable mild bibasilar strandy densities, unchanged. No pleural effusion or focal consolidation. No  pneumothorax. Single lumen LEFT chest Port-A-Cath with distal tip projecting cavoatrial junction. No pneumothorax. Soft tissue planes included osseous structures are nonsuspicious. Surgical clips in the included right abdomen compatible with cholecystectomy. IMPRESSION: Stable cardiomegaly.  No acute pulmonary process. Electronically Signed   By: Elon Alas M.D.   On: 09/26/2015 04:21   Dg Chest 2 View  Result Date: 09/23/2015 CLINICAL DATA:  Fever today EXAM: CHEST  2 VIEW COMPARISON:  09/18/2015 FINDINGS: Left chest wall port is again seen and stable. Cardiac shadow remains enlarged. The lungs are well aerated bilaterally. Diffuse interstitial changes are again seen stable from the prior study. No focal infiltrate is seen. IMPRESSION: No acute abnormality noted. Electronically Signed   By: Inez Catalina M.D.   On: 09/23/2015 13:04   Dg Chest 2 View  Result Date: 09/18/2015 CLINICAL DATA:  Status post fall, with upper anterior chest pain. Initial encounter. EXAM: CHEST  2 VIEW COMPARISON:  Chest radiograph performed 09/01/2015 FINDINGS: The lungs are well-aerated. Mild vascular congestion is noted, with mild bibasilar atelectasis. There is no evidence of pleural effusion or pneumothorax. The heart is enlarged. A left-sided chest port  ends about the cavoatrial junction. No acute osseous abnormalities are seen. IMPRESSION: Mild vascular congestion and cardiomegaly noted. Mild bibasilar atelectasis seen. No displaced rib fractures identified. Electronically Signed   By: Garald Balding M.D.   On: 09/18/2015 02:17   Dg Chest 2 View  Result Date: 09/01/2015 CLINICAL DATA:  Sickle cell crisis.  Hypoxemia.  Weakness today. EXAM: CHEST  2 VIEW COMPARISON:  08/28/2015 FINDINGS: Left-sided Port-A-Cath tip to the level of the lower superior vena cava -right atrial junction. Heart is markedly enlarged and stable in configuration. There is mild pulmonary vascular congestion. No overt edema. There is minimal  right lower lobe atelectasis. No pleural effusions or consolidations. Surgical clips are noted in the upper abdomen. Note is made of sclerosis within the right humeral head, raising the question of avascular necrosis. Left humeral head is not imaged today. IMPRESSION: Cardiomegaly and vascular congestion.  Right lower lobe atelectasis. Question avascular necrosis of the right humeral head. Electronically Signed   By: Nolon Nations M.D.   On: 09/01/2015 10:44   Dg Lumbar Spine Complete  Result Date: 09/18/2015 CLINICAL DATA:  Status post fall, with lower back pain radiating to both hips. Initial encounter. EXAM: LUMBAR SPINE - COMPLETE 4+ VIEW COMPARISON:  CT of the abdomen and pelvis from 06/03/2015 FINDINGS: There is no evidence of fracture or subluxation. Vertebral bodies demonstrate normal height and alignment. Intervertebral disc spaces are preserved. The visualized neural foramina are grossly unremarkable in appearance. A right hip arthroplasty is only partially imaged but appears grossly unremarkable. The visualized bowel gas pattern is unremarkable in appearance; air and stool are noted within the colon. The sacroiliac joints are within normal limits. Clips are noted within the right upper quadrant, reflecting prior cholecystectomy. IMPRESSION: No evidence of fracture or subluxation along the lumbar spine. Electronically Signed   By: Garald Balding M.D.   On: 09/18/2015 02:19   Dg Hip Unilat W Or Wo Pelvis 2-3 Views Right  Result Date: 09/18/2015 CLINICAL DATA:  Status post fall, landing on right side, with right hip pain. Initial encounter. EXAM: DG HIP (WITH OR WITHOUT PELVIS) 2-3V RIGHT COMPARISON:  None. FINDINGS: There is no evidence of fracture or dislocation. The patient's right hip arthroplasty is grossly unremarkable in appearance, without evidence of loosening. Underlying screws are noted along the right acetabulum. Both femoral heads are seated normally within their respective acetabula.  The proximal right femur appears intact. No significant degenerative change is appreciated. The sacroiliac joints are unremarkable in appearance. The visualized bowel gas pattern is grossly unremarkable in appearance. IMPRESSION: No evidence of fracture or dislocation. Right hip arthroplasty is grossly intact, without evidence of loosening. Electronically Signed   By: Garald Balding M.D.   On: 09/18/2015 02:16    Signed:  [name] MD, [place], [place], [place], CPE   09/28/2015, 4:48 PM

## 2015-09-28 NOTE — ED Notes (Signed)
Jacubowitz MD at bedside.

## 2015-09-28 NOTE — Discharge Instructions (Signed)
Go directly to the sickle cell clinic upon leaving here

## 2015-09-28 NOTE — ED Notes (Signed)
Pt reports acute onset SCC this morning associated lower back pain; denies GU symptoms.

## 2015-09-28 NOTE — Discharge Instructions (Signed)
Sickle Cell Anemia, Adult Sickle cell anemia is a condition in which red blood cells have an abnormal "sickle" shape. This abnormal shape shortens the cells' life span, which results in a lower than normal concentration of red blood cells in the blood. The sickle shape also causes the cells to clump together and block free blood flow through the blood vessels. As a result, the tissues and organs of the body do not receive enough oxygen. Sickle cell anemia causes organ damage and pain and increases the risk of infection. CAUSES  Sickle cell anemia is a genetic disorder. Those who receive two copies of the gene have the condition, and those who receive one copy have the trait. RISK FACTORS The sickle cell gene is most common in people whose families originated in [place]. Other areas of the globe where sickle cell trait occurs include the [place], [place] and [place], the [place], and the [place].  SIGNS AND SYMPTOMS  Pain, especially in the extremities, back, chest, or abdomen (common). The pain may start suddenly or may develop following an illness, especially if there is dehydration. Pain can also occur due to overexertion or exposure to extreme temperature changes.  Frequent severe bacterial infections, especially certain types of pneumonia and meningitis.  Pain and swelling in the hands and feet.  Decreased activity.   Loss of appetite.   Change in behavior.  Headaches.  Seizures.  Shortness of breath or difficulty breathing.  Vision changes.  Skin ulcers. Those with the trait may not have symptoms or they may have mild symptoms.  DIAGNOSIS  Sickle cell anemia is diagnosed with blood tests that demonstrate the genetic trait. It is often diagnosed during the newborn period, due to mandatory testing nationwide. A variety of blood tests, X-rays, CT scans, MRI scans, ultrasounds, and lung function tests may also be done to monitor the condition. TREATMENT  Sickle  cell anemia may be treated with:  Medicines. You may be given pain medicines, antibiotic medicines (to treat and prevent infections) or medicines to increase the production of certain types of hemoglobin.  Fluids.  Oxygen.  Blood transfusions. HOME CARE INSTRUCTIONS   Drink enough fluid to keep your urine clear or pale yellow. Increase your fluid intake in hot weather and during exercise.  Do not smoke. Smoking lowers oxygen levels in the blood.   Only take over-the-counter or prescription medicines for pain, fever, or discomfort as directed by your health care provider.  Take antibiotics as directed by your health care provider. Make sure you finish them it even if you start to feel better.   Take supplements as directed by your health care provider.   Consider wearing a medical alert bracelet. This tells anyone caring for you in an emergency of your condition.   When traveling, keep your medical information, health care provider's names, and the medicines you take with you at all times.   If you develop a fever, do not take medicines to reduce the fever right away. This could cover up a problem that is developing. Notify your health care provider.  Keep all follow-up appointments with your health care provider. Sickle cell anemia requires regular medical care. SEEK MEDICAL CARE IF: You have a fever. SEEK IMMEDIATE MEDICAL CARE IF:   You feel dizzy or faint.   You have new abdominal pain, especially on the left side near the stomach area.   You develop a persistent, often uncomfortable and painful penile erection (priapism). If this is not treated immediately it   will lead to impotence.   You have numbness your arms or legs or you have a hard time moving them.   You have a hard time with speech.   You have a fever or persistent symptoms for more than 2-3 days.   You have a fever and your symptoms suddenly get worse.   You have signs or symptoms of infection.  These include:   Chills.   Abnormal tiredness (lethargy).   Irritability.   Poor eating.   Vomiting.   You develop pain that is not helped with medicine.   You develop shortness of breath.  You have pain in your chest.   You are coughing up pus-like or bloody sputum.   You develop a stiff neck.  Your feet or hands swell or have pain.  Your abdomen appears bloated.  You develop joint pain. MAKE SURE YOU:  Understand these instructions.   This information is not intended to replace advice given to you by your health care provider. Make sure you discuss any questions you have with your health care provider.   Document Released: 04/27/2005 Document Revised: 02/07/2014 Document Reviewed: 08/29/2012 Elsevier Interactive Patient Education 2016 [employer]  

## 2015-09-28 NOTE — ED Provider Notes (Signed)
[facility] DEPT Provider Note   CSN: [id number] Arrival date & time: 09/28/15  0757     History   Chief Complaint Chief Complaint  Patient presents with  . Sickle Cell Pain Crisis  . Flank Pain    HPI [name] is a 35 y.o. male.Complete bilateral flank pain onset upon awakening this morning typical of sickle cell crises he's had in the past. He treated himself with MS Contin and with hydromorphone without adequate pain relief. He denies cough denies shortness breath denies fever. Nothing makes pain better or worse. No other associated symptoms.  HPI  Past Medical History:  Diagnosis Date  . Acute chest syndrome ([place]) 06/18/2013  . Acute embolism and thrombosis of right internal jugular vein ([place])   . Alcohol consumption of one to four drinks per day   . Avascular necrosis (HCC)    Right Hip  . Blood transfusion   . Chronic anticoagulation   . Demand ischemia ([place]) 01/02/2014  . Former smoker   . Functional asplenia   . Hb-SS disease with crisis ([place])   . History of Clostridium difficile infection   . History of pulmonary embolus (PE)   . Hypertension   . Hypokalemia   . Leukocytosis    Chronic  . Mood disorder ([place])   . Noncompliance with medication regimen   . Oxygen deficiency   . Pulmonary hypertension ([place])   . Second hand tobacco smoke exposure   . Sickle cell anemia (HCC)   . Sickle-cell crisis with associated acute chest syndrome ([place]) 05/13/2013  . Thrombocytosis (HCC)    Chronic  . Uses marijuana     Patient Active Problem List   Diagnosis Date Noted  . Sickle cell pain crisis ([place]) 09/18/2015  . Sickle cell anemia with crisis ([place]) 09/04/2015  . Shortness of breath   . Pulmonary embolism without acute cor pulmonale ([place])   . Fever   . Sickle cell anemia with pain ([place]) 05/07/2015  . Hypoxia   . Anemia 01/31/2015  . Symptomatic anemia   . Hb-SS disease without crisis ([place]) 10/07/2014  . PVC's (premature ventricular contractions)  09/10/2014  . Abnormal EKG 09/10/2014  . Chronic atrial fibrillation ([place]) 09/09/2014  . Cor pulmonale, chronic ([place]) 08/25/2014  . Sickle cell anemia ([place]) 06/25/2014  . Anemia of chronic disease 06/25/2014  . PAH (pulmonary artery hypertension) ([place]) 03/18/2014  . Hb-SS disease with crisis ([place]) 01/22/2014  . Paralytic strabismus, external ophthalmoplegia   . Chronic pain syndrome 12/12/2013  . Chronic anticoagulation 08/22/2013  . Essential hypertension 08/22/2013  . Pulmonary HTN ([place]) 06/18/2013  . Functional asplenia   . Vitamin D deficiency 02/13/2013  . Hx of pulmonary embolus 06/29/2012  . Hemochromatosis 12/14/2011  . Avascular necrosis [facility])     Past Surgical History:  Procedure Laterality Date  . CHOLECYSTECTOMY     01/2008  . Excision of left periauricular cyst     10/2009  . Excision of right ear lobe cyst with primary closur     11/2007  . Porta cath placement    . Porta cath removal    . PORTACATH PLACEMENT  01/05/2012   Procedure: INSERTION PORT-A-CATH;  Surgeon: Odis Hollingshead, MD;  Location: [facility];  Service: General;  Laterality: N/A;  ultrasound guiced port a cath insertion with fluoroscopy  . Right hip replacement     08/2006  . UMBILICAL HERNIA REPAIR     01/2008       Home Medications  Prior to Admission medications   Medication Sig Start Date End Date Taking? Authorizing Provider  ambrisentan (LETAIRIS) 5 MG tablet Take 5 mg by mouth at bedtime.    Yes Historical Provider, MD  aspirin 81 MG chewable tablet Chew 1 tablet (81 mg total) by mouth daily. 07/23/14  Yes [name], MD  Cholecalciferol (VITAMIN D) 2000 units tablet Take 1 tablet (2,000 Units total) by mouth daily. 02/23/15  Yes Dorena Dew, FNP  Deferasirox (JADENU) 360 MG TABS Take 1,080 mg by mouth daily.    Yes Historical Provider, MD  folic acid (FOLVITE) 1 MG tablet Take 1 tablet (1 mg total) by mouth every morning. 02/23/15  Yes Dorena Dew, FNP  gabapentin  (NEURONTIN) 300 MG capsule Take 1 capsule (300 mg total) by mouth 3 (three) times daily. 06/01/15  Yes Tresa Garter, MD  HYDROmorphone (DILAUDID) 4 MG tablet Take 1 tablet (4 mg total) by mouth every 4 (four) hours as needed for severe pain. 09/26/15  Yes Shary Decamp, PA-C  lisinopril (PRINIVIL,ZESTRIL) 5 MG tablet Take 1 tablet (5 mg total) by mouth daily. 08/04/15  Yes [name], MD  morphine (MSIR) 30 MG tablet Take 1 tablet (30 mg total) by mouth every 12 (twelve) hours. 09/26/15  Yes Shary Decamp, PA-C  rivaroxaban (XARELTO) 20 MG TABS tablet Take 20 mg by mouth daily with supper.   Yes Historical Provider, MD  torsemide (DEMADEX) 20 MG tablet Take 20 mg by mouth daily as needed. For increase in patient weight greater than 3 lbs. 07/27/15 07/26/16 Yes Historical Provider, MD  potassium chloride SA (K-DUR,KLOR-CON) 20 MEQ tablet Take 1 tablet (20 mEq total) by mouth every morning. Patient not taking: Reported on 09/28/2015 04/13/15   Dorena Dew, FNP  zolpidem (AMBIEN) 10 MG tablet Take 1 tablet (10 mg total) by mouth at bedtime as needed for sleep. 09/07/15   Tresa Garter, MD    Family History Family History  Problem Relation Age of Onset  . Sickle cell trait Mother   . Depression Mother   . Diabetes Mother   . Sickle cell trait Father   . Sickle cell trait Brother     Social History Social History  Substance Use Topics  . Smoking status: Former Smoker    Packs/day: 0.50    Years: 10.00    Types: Cigarettes    Quit date: 05/29/2011  . Smokeless tobacco: Never Used  . Alcohol use No     Allergies   Review of patient's allergies indicates no known allergies.   Review of Systems Review of Systems  Constitutional: Negative.   HENT: Negative.   Respiratory: Negative.   Cardiovascular: Positive for chest pain.       Syncope  Gastrointestinal: Negative.   Genitourinary: Positive for flank pain.  Skin: Negative.   Allergic/Immunologic: Positive for  immunocompromised state.       Sickle cell patient  Neurological: Negative.   Psychiatric/Behavioral: Negative.   All other systems reviewed and are negative.    Physical Exam Updated Vital Signs BP 109/70 (BP Location: Left Arm)   Pulse 81   Temp 98.5 F (36.9 C) (Oral)   Resp 16   Ht 6' (1.829 m)   Wt 173 lb (78.5 kg)   SpO2 97%   BMI 23.46 kg/m   Physical Exam  Constitutional: He appears well-developed and well-nourished.  HENT:  Head: Normocephalic and atraumatic.  Eyes: Conjunctivae are normal. Pupils are equal, round, and reactive  to light.  Neck: Neck supple. No tracheal deviation present. No thyromegaly present.  Cardiovascular: Normal rate and regular rhythm.   No murmur heard. Pulmonary/Chest: Effort normal and breath sounds normal.  Abdominal: Soft. Bowel sounds are normal. He exhibits no distension. There is no tenderness.  Genitourinary: Penis normal.  Musculoskeletal: Normal range of motion. He exhibits no edema or tenderness.  Neurological: He is alert. Coordination normal.  Skin: Skin is warm and dry. No rash noted.  Psychiatric: He has a normal mood and affect.  Nursing note and vitals reviewed.    ED Treatments / Results  Labs (all labs ordered are listed, but only abnormal results are displayed) Labs Reviewed  COMPREHENSIVE METABOLIC PANEL  CBC WITH DIFFERENTIAL/PLATELET  CBC WITH DIFFERENTIAL/PLATELET  URINALYSIS, ROUTINE W REFLEX MICROSCOPIC (NOT AT [facility])    EKG  EKG Interpretation None       Radiology No results found.  Procedures Procedures (including critical care time)  Medications Ordered in ED Medications  HYDROmorphone (DILAUDID) injection 2 mg (not administered)    Or  HYDROmorphone (DILAUDID) injection 2 mg (not administered)     Initial Impression / Assessment and Plan / ED Course  I have reviewed the triage vital signs and the nursing notes.  Pertinent labs & imaging results that were available during my care of  the patient were reviewed by me and considered in my medical decision making (see chart for details).  Clinical Course   Results for orders placed or performed during the hospital encounter of 09/28/15  Comprehensive metabolic panel  Result Value Ref Range   Sodium 137 135 - 145 mmol/L   Potassium 3.8 3.5 - 5.1 mmol/L   Chloride 107 101 - 111 mmol/L   CO2 24 22 - 32 mmol/L   Glucose, Bld 96 65 - 99 mg/dL   BUN 10 6 - 20 mg/dL   Creatinine, Ser 0.65 0.61 - 1.24 mg/dL   Calcium 9.1 8.9 - 10.3 mg/dL   Total Protein 7.7 6.5 - 8.1 g/dL   Albumin 4.3 3.5 - 5.0 g/dL   AST 24 15 - 41 U/L   ALT 14 (L) 17 - 63 U/L   Alkaline Phosphatase 74 38 - 126 U/L   Total Bilirubin 2.9 (H) 0.3 - 1.2 mg/dL   GFR calc non Af Amer >60 >60 mL/min   GFR calc Af Amer >60 >60 mL/min   Anion gap 6 5 - 15  CBC with Differential  Result Value Ref Range   WBC 12.9 (H) 4.0 - 10.5 K/uL   RBC 2.26 (L) 4.22 - 5.81 MIL/uL   Hemoglobin 7.0 (L) 13.0 - 17.0 g/dL   HCT 20.7 (L) 39.0 - 52.0 %   MCV 91.6 78.0 - 100.0 fL   MCH 31.0 26.0 - 34.0 pg   MCHC 33.8 30.0 - 36.0 g/dL   RDW 20.3 (H) 11.5 - 15.5 %   Platelets 564 (H) 150 - 400 K/uL   Neutrophils Relative % 75 %   Lymphocytes Relative 10 %   Monocytes Relative 12 %   Eosinophils Relative 2 %   Basophils Relative 1 %   Neutro Abs 9.7 (H) 1.7 - 7.7 K/uL   Lymphs Abs 1.3 0.7 - 4.0 K/uL   Monocytes Absolute 1.5 (H) 0.1 - 1.0 K/uL   Eosinophils Absolute 0.3 0.0 - 0.7 K/uL   Basophils Absolute 0.1 0.0 - 0.1 K/uL   RBC Morphology POLYCHROMASIA PRESENT   Urinalysis, Routine w reflex microscopic (not at [facility])  Result  Value Ref Range   Color, Urine YELLOW YELLOW   APPearance CLEAR CLEAR   Specific Gravity, Urine 1.012 1.005 - 1.030   pH 7.0 5.0 - 8.0   Glucose, UA NEGATIVE NEGATIVE mg/dL   Hgb urine dipstick NEGATIVE NEGATIVE   Bilirubin Urine NEGATIVE NEGATIVE   Ketones, ur NEGATIVE NEGATIVE mg/dL   Protein, ur NEGATIVE NEGATIVE mg/dL   Nitrite NEGATIVE  NEGATIVE   Leukocytes, UA NEGATIVE NEGATIVE   *Note: Due to a large number of results and/or encounters for the requested time period, some results have not been displayed. A complete set of results can be found in Results Review.   Dg Chest 2 View  Result Date: 09/26/2015 CLINICAL DATA:  Bilateral rib pain radiating to back for 2 days. History of hypertension and sickle cell. EXAM: CHEST  2 VIEW COMPARISON:  Chest radiograph September 23, 2015 FINDINGS: The cardiac silhouette is moderately enlarged and unchanged. Mediastinal silhouette is nonsuspicious. Stable mild bibasilar strandy densities, unchanged. No pleural effusion or focal consolidation. No pneumothorax. Single lumen LEFT chest Port-A-Cath with distal tip projecting cavoatrial junction. No pneumothorax. Soft tissue planes included osseous structures are nonsuspicious. Surgical clips in the included right abdomen compatible with cholecystectomy. IMPRESSION: Stable cardiomegaly.  No acute pulmonary process. Electronically Signed   By: Elon Alas M.D.   On: 09/26/2015 04:21   Dg Chest 2 View  Result Date: 09/23/2015 CLINICAL DATA:  Fever today EXAM: CHEST  2 VIEW COMPARISON:  09/18/2015 FINDINGS: Left chest wall port is again seen and stable. Cardiac shadow remains enlarged. The lungs are well aerated bilaterally. Diffuse interstitial changes are again seen stable from the prior study. No focal infiltrate is seen. IMPRESSION: No acute abnormality noted. Electronically Signed   By: Inez Catalina M.D.   On: 09/23/2015 13:04   Dg Chest 2 View  Result Date: 09/18/2015 CLINICAL DATA:  Status post fall, with upper anterior chest pain. Initial encounter. EXAM: CHEST  2 VIEW COMPARISON:  Chest radiograph performed 09/01/2015 FINDINGS: The lungs are well-aerated. Mild vascular congestion is noted, with mild bibasilar atelectasis. There is no evidence of pleural effusion or pneumothorax. The heart is enlarged. A left-sided chest port ends about the  cavoatrial junction. No acute osseous abnormalities are seen. IMPRESSION: Mild vascular congestion and cardiomegaly noted. Mild bibasilar atelectasis seen. No displaced rib fractures identified. Electronically Signed   By: Garald Balding M.D.   On: 09/18/2015 02:17   Dg Chest 2 View  Result Date: 09/01/2015 CLINICAL DATA:  Sickle cell crisis.  Hypoxemia.  Weakness today. EXAM: CHEST  2 VIEW COMPARISON:  08/28/2015 FINDINGS: Left-sided Port-A-Cath tip to the level of the lower superior vena cava -right atrial junction. Heart is markedly enlarged and stable in configuration. There is mild pulmonary vascular congestion. No overt edema. There is minimal right lower lobe atelectasis. No pleural effusions or consolidations. Surgical clips are noted in the upper abdomen. Note is made of sclerosis within the right humeral head, raising the question of avascular necrosis. Left humeral head is not imaged today. IMPRESSION: Cardiomegaly and vascular congestion.  Right lower lobe atelectasis. Question avascular necrosis of the right humeral head. Electronically Signed   By: Nolon Nations M.D.   On: 09/01/2015 10:44   Dg Lumbar Spine Complete  Result Date: 09/18/2015 CLINICAL DATA:  Status post fall, with lower back pain radiating to both hips. Initial encounter. EXAM: LUMBAR SPINE - COMPLETE 4+ VIEW COMPARISON:  CT of the abdomen and pelvis from 06/03/2015 FINDINGS: There is no evidence  of fracture or subluxation. Vertebral bodies demonstrate normal height and alignment. Intervertebral disc spaces are preserved. The visualized neural foramina are grossly unremarkable in appearance. A right hip arthroplasty is only partially imaged but appears grossly unremarkable. The visualized bowel gas pattern is unremarkable in appearance; air and stool are noted within the colon. The sacroiliac joints are within normal limits. Clips are noted within the right upper quadrant, reflecting prior cholecystectomy. IMPRESSION: No  evidence of fracture or subluxation along the lumbar spine. Electronically Signed   By: Garald Balding M.D.   On: 09/18/2015 02:19   Dg Hip Unilat W Or Wo Pelvis 2-3 Views Right  Result Date: 09/18/2015 CLINICAL DATA:  Status post fall, landing on right side, with right hip pain. Initial encounter. EXAM: DG HIP (WITH OR WITHOUT PELVIS) 2-3V RIGHT COMPARISON:  None. FINDINGS: There is no evidence of fracture or dislocation. The patient's right hip arthroplasty is grossly unremarkable in appearance, without evidence of loosening. Underlying screws are noted along the right acetabulum. Both femoral heads are seated normally within their respective acetabula. The proximal right femur appears intact. No significant degenerative change is appreciated. The sacroiliac joints are unremarkable in appearance. The visualized bowel gas pattern is grossly unremarkable in appearance. IMPRESSION: No evidence of fracture or dislocation. Right hip arthroplasty is grossly intact, without evidence of loosening. Electronically Signed   By: Garald Balding M.D.   On: 09/18/2015 02:16    10:20 AM pain improved after treatment with intravenous hydromorphone however patient states his pain is not yet under control. He is alert and eating breakfast in bed and appears in no distress. Case discussed with Dr.Jegede. Patient will be discharged from here and go directly to sickle cell clinic upon leaving here for further pain management Hemoglobin is stable. No signs of infection Final Clinical Impressions(s) / ED Diagnoses  Diagnosis sickle cell crisis, vaso-occlusive type Final diagnoses:  None    New Prescriptions New Prescriptions   No medications on file     Orlie Dakin, MD 09/28/15 [id number]

## 2015-09-28 NOTE — Progress Notes (Signed)
Pt received to the [facility] for treatment from the ED via wheelchair. Pt stated his pain was in his ribs and sides and that his pain # was 8/10. Pt's porta cath was accessed, so just check for blood return before using. Pt was treated with IV fluids, IV toradol and Dilaudid PCA. Pt tolerate the treatment fairly well. Was down to 6/10 at discharge but felt like he need to stay a little longer. RN spoke with Dr. Doreene Burke and both he and the patient felt like if he went home today and took his meds as prescribed and not feel any better, he could call early am and come back for more treatment. Pt felt like he could do that. D/C instructions given to patient with verbal understanding and he stated he would call in the morning to let staff know if he felt like he need to come in. Porta cath was flushed and deaccessed per protocol.  Pt was alert, oriented and ambulatory at discharge. Pt also stated that he had call someone to come and pick him up.

## 2015-09-28 NOTE — ED Triage Notes (Signed)
Pt c/o bilateral flank pain r/t Sickle Cell Crisis starting this morning.  Pain score 9/10.  Pt reports taking all medications as prescribed and attempting to stay hydrated.  Denies GU complaints.

## 2015-09-29 ENCOUNTER — Encounter (HOSPITAL_COMMUNITY): Payer: Self-pay | Admitting: *Deleted

## 2015-09-29 ENCOUNTER — Non-Acute Institutional Stay (HOSPITAL_COMMUNITY)
Admission: AD | Admit: 2015-09-29 | Discharge: 2015-09-29 | Disposition: A | Discharge status: Home / Self Care | Payer: Medicare Other | Source: Ambulatory Visit | Attending: Internal Medicine | Admitting: Internal Medicine

## 2015-09-29 DIAGNOSIS — Z86711 Personal history of pulmonary embolism: Secondary | ICD-10-CM | POA: Insufficient documentation

## 2015-09-29 DIAGNOSIS — I10 Essential (primary) hypertension: Secondary | ICD-10-CM | POA: Diagnosis not present

## 2015-09-29 DIAGNOSIS — D5700 Hb-SS disease with crisis, unspecified: Secondary | ICD-10-CM | POA: Diagnosis not present

## 2015-09-29 DIAGNOSIS — Z86718 Personal history of other venous thrombosis and embolism: Secondary | ICD-10-CM | POA: Insufficient documentation

## 2015-09-29 DIAGNOSIS — Z79899 Other long term (current) drug therapy: Secondary | ICD-10-CM | POA: Diagnosis not present

## 2015-09-29 DIAGNOSIS — Z79891 Long term (current) use of opiate analgesic: Secondary | ICD-10-CM | POA: Diagnosis not present

## 2015-09-29 DIAGNOSIS — Z9981 Dependence on supplemental oxygen: Secondary | ICD-10-CM | POA: Insufficient documentation

## 2015-09-29 DIAGNOSIS — I2781 Cor pulmonale (chronic): Secondary | ICD-10-CM | POA: Diagnosis not present

## 2015-09-29 DIAGNOSIS — I272 Other secondary pulmonary hypertension: Secondary | ICD-10-CM | POA: Insufficient documentation

## 2015-09-29 DIAGNOSIS — Z7982 Long term (current) use of aspirin: Secondary | ICD-10-CM | POA: Insufficient documentation

## 2015-09-29 DIAGNOSIS — Z87891 Personal history of nicotine dependence: Secondary | ICD-10-CM | POA: Insufficient documentation

## 2015-09-29 DIAGNOSIS — J9610 Chronic respiratory failure, unspecified whether with hypoxia or hypercapnia: Secondary | ICD-10-CM | POA: Diagnosis not present

## 2015-09-29 DIAGNOSIS — Z7901 Long term (current) use of anticoagulants: Secondary | ICD-10-CM | POA: Diagnosis not present

## 2015-09-29 MED ORDER — ONDANSETRON HCL 4 MG/2ML IJ SOLN
4.0000 mg | Freq: Four times a day (QID) | INTRAMUSCULAR | Status: DC | PRN
Start: 2015-09-29 — End: 2015-09-29

## 2015-09-29 MED ORDER — KETOROLAC TROMETHAMINE 30 MG/ML IJ SOLN
30.0000 mg | Freq: Four times a day (QID) | INTRAMUSCULAR | Status: DC
  Administered 2015-09-29: 30 mg via INTRAVENOUS
  Filled 2015-09-29: qty 1

## 2015-09-29 MED ORDER — DEXTROSE-NACL 5-0.45 % IV SOLN
INTRAVENOUS | Status: DC
  Administered 2015-09-29: 09:00:00 via INTRAVENOUS

## 2015-09-29 MED ORDER — SODIUM CHLORIDE 0.9% FLUSH: 9.0000 mL | INTRAVENOUS | Status: DC | PRN

## 2015-09-29 MED ORDER — SENNOSIDES-DOCUSATE SODIUM 8.6-50 MG PO TABS: 1.0000 | ORAL_TABLET | Freq: Two times a day (BID) | ORAL | Status: DC

## 2015-09-29 MED ORDER — SODIUM CHLORIDE 0.9% FLUSH
10.0000 mL | INTRAVENOUS | Status: AC | PRN
  Administered 2015-09-29: 10 mL

## 2015-09-29 MED ORDER — SODIUM CHLORIDE 0.9 % IV SOLN
25.0000 mg | INTRAVENOUS | Status: DC | PRN
  Filled 2015-09-29: qty 0.5

## 2015-09-29 MED ORDER — HEPARIN SOD (PORK) LOCK FLUSH 100 UNIT/ML IV SOLN
500.0000 [IU] | INTRAVENOUS | Status: AC | PRN
  Administered 2015-09-29: 500 [IU]
  Filled 2015-09-29: qty 5

## 2015-09-29 MED ORDER — POLYETHYLENE GLYCOL 3350 17 G PO PACK
17.0000 g | PACK | Freq: Every day | ORAL | Status: DC | PRN
  Filled 2015-09-29: qty 1

## 2015-09-29 MED ORDER — HYDROMORPHONE 1 MG/ML IV SOLN
INTRAVENOUS | Status: DC
  Administered 2015-09-29: 6.3 mg via INTRAVENOUS
  Administered 2015-09-29: 5.6 mg via INTRAVENOUS
  Administered 2015-09-29: 11.1 mg via INTRAVENOUS
  Administered 2015-09-29 (×2): via INTRAVENOUS
  Filled 2015-09-29 (×2): qty 25

## 2015-09-29 MED ORDER — NALOXONE HCL 0.4 MG/ML IJ SOLN: 0.4000 mg | INTRAMUSCULAR | Status: DC | PRN

## 2015-09-29 MED ORDER — DIPHENHYDRAMINE HCL 25 MG PO CAPS: 25.0000 mg | ORAL_CAPSULE | ORAL | Status: DC | PRN

## 2015-09-29 NOTE — Discharge Instructions (Signed)

## 2015-09-29 NOTE — Discharge Summary (Signed)
Physician Discharge Summary  CALISTRO OELKE [id number] DOB: [birth year] DOA: 09/29/2015  PCP: [name], MD  Admit date: 09/29/2015  Discharge date: 09/29/2015  Time spent: 30 minutes  Discharge Diagnoses:  Active Problems:   Sickle cell anemia with pain [facility])  Discharge Condition: Stable  Diet recommendation: Regular  Filed Weights   09/29/15 0820  Weight: 173 lb (78.5 kg)    History of present illness:  [name] is a 35 y.o. male with history of sickle cell disease, acute chest syndrome, DVT, PE on Xarelto, pulmonary hypertension, cor pulmonale, chronic respiratory failure on home oxygen, who presented to the day hospital today complaining of pain in bilateral ribs, bilateral flank area and lower back. Patient was seen here yesterday after discharge from the emergency department, he was treated for his pain whole day, discharge home in the evening after some improvement. He however said his pain is not controlled, he woke up this morning with similar pain, rating pain intensity at 9/10.Patient denies chest pain, shortness of breath, difficulty breathing.Patient denies N/V/D and abdominal pain.Patient states he has taken his home pain medication without improvement before presenting to the ED. No urinary symptom.  Hospital Course:  KAISEN HUESMAN was admitted to the day hospital with sickle cell painful crisis. Patient was treated with weight based IV Dilaudid PCA, IV Toradol as well as IV fluids. Beckham showed some improvement symptomatically, pain improved from 9 to 5/10 at the time of discharge. Patient was discharged home in a hemodynamically stable condition. Hakeim will follow-up at the clinic as previously scheduled, continue with home medications as per prior to admission.  Discharge Instructions We discussed the need for good hydration, monitoring of hydration status, avoidance of heat, cold, stress, and infection triggers. We discussed the need  to be compliant with taking Hydrea. Franko was reminded of the need to seek medical attention of any symptoms of bleeding, anemia, or infection occurs.  Discharge Exam: Vitals:   09/29/15 1438 09/29/15 1521  BP: 110/68   Pulse: 82   Resp: (!) 22 16  Temp:      General appearance: alert, cooperative and no distress Eyes: conjunctivae/corneas clear. PERRL, EOM's intact. Fundi benign. Neck: no adenopathy, no carotid bruit, no JVD, supple, symmetrical, trachea midline and thyroid not enlarged, symmetric, no tenderness/mass/nodules Back: symmetric, no curvature. ROM normal. No CVA tenderness. Resp: clear to auscultation bilaterally Chest wall: no tenderness Cardio: regular rate and rhythm, S1, S2 normal, no murmur, click, rub or gallop GI: soft, non-tender; bowel sounds normal; no masses, no organomegaly Extremities: extremities normal, atraumatic, no cyanosis or edema Pulses: 2+ and symmetric Skin: Skin color, texture, turgor normal. No rashes or lesions Neurologic: Grossly normal  Discharge Instructions    Diet - low sodium heart healthy    Complete by:  As directed   Increase activity slowly    Complete by:  As directed     Current Discharge Medication List    CONTINUE these medications which have NOT CHANGED   Details  ambrisentan (LETAIRIS) 5 MG tablet Take 5 mg by mouth at bedtime.     aspirin 81 MG chewable tablet Chew 1 tablet (81 mg total) by mouth daily. Qty: 30 tablet, Refills: 11    Cholecalciferol (VITAMIN D) 2000 units tablet Take 1 tablet (2,000 Units total) by mouth daily. Qty: 90 tablet, Refills: 3   Associated Diagnoses: Vitamin D deficiency    Deferasirox (JADENU) 360 MG TABS Take 1,080 mg by mouth daily.  folic acid (FOLVITE) 1 MG tablet Take 1 tablet (1 mg total) by mouth every morning. Qty: 30 tablet, Refills: 11   Associated Diagnoses: Hb-SS disease without crisis ([place])    gabapentin (NEURONTIN) 300 MG capsule Take 1 capsule (300 mg total) by  mouth 3 (three) times daily. Qty: 270 capsule, Refills: 3    HYDROmorphone (DILAUDID) 4 MG tablet Take 1 tablet (4 mg total) by mouth every 4 (four) hours as needed for severe pain. Qty: 12 tablet, Refills: 0    lisinopril (PRINIVIL,ZESTRIL) 5 MG tablet Take 1 tablet (5 mg total) by mouth daily. Qty: 30 tablet, Refills: 1    morphine (MSIR) 30 MG tablet Take 1 tablet (30 mg total) by mouth every 12 (twelve) hours. Qty: 6 tablet, Refills: 0    rivaroxaban (XARELTO) 20 MG TABS tablet Take 20 mg by mouth daily with supper.    zolpidem (AMBIEN) 10 MG tablet Take 1 tablet (10 mg total) by mouth at bedtime as needed for sleep. Qty: 30 tablet, Refills: 0   Associated Diagnoses: Insomnia    potassium chloride SA (K-DUR,KLOR-CON) 20 MEQ tablet Take 1 tablet (20 mEq total) by mouth every morning. Qty: 30 tablet, Refills: 3   Associated Diagnoses: Pulmonary HTN (HCC)    torsemide (DEMADEX) 20 MG tablet Take 20 mg by mouth daily as needed. For increase in patient weight greater than 3 lbs.       No Known Allergies   Significant Diagnostic Studies: Dg Chest 2 View  Result Date: 09/26/2015 CLINICAL DATA:  Bilateral rib pain radiating to back for 2 days. History of hypertension and sickle cell. EXAM: CHEST  2 VIEW COMPARISON:  Chest radiograph September 23, 2015 FINDINGS: The cardiac silhouette is moderately enlarged and unchanged. Mediastinal silhouette is nonsuspicious. Stable mild bibasilar strandy densities, unchanged. No pleural effusion or focal consolidation. No pneumothorax. Single lumen LEFT chest Port-A-Cath with distal tip projecting cavoatrial junction. No pneumothorax. Soft tissue planes included osseous structures are nonsuspicious. Surgical clips in the included right abdomen compatible with cholecystectomy. IMPRESSION: Stable cardiomegaly.  No acute pulmonary process. Electronically Signed   By: Elon Alas M.D.   On: 09/26/2015 04:21   Dg Chest 2 View  Result Date:  09/23/2015 CLINICAL DATA:  Fever today EXAM: CHEST  2 VIEW COMPARISON:  09/18/2015 FINDINGS: Left chest wall port is again seen and stable. Cardiac shadow remains enlarged. The lungs are well aerated bilaterally. Diffuse interstitial changes are again seen stable from the prior study. No focal infiltrate is seen. IMPRESSION: No acute abnormality noted. Electronically Signed   By: Inez Catalina M.D.   On: 09/23/2015 13:04   Dg Chest 2 View  Result Date: 09/18/2015 CLINICAL DATA:  Status post fall, with upper anterior chest pain. Initial encounter. EXAM: CHEST  2 VIEW COMPARISON:  Chest radiograph performed 09/01/2015 FINDINGS: The lungs are well-aerated. Mild vascular congestion is noted, with mild bibasilar atelectasis. There is no evidence of pleural effusion or pneumothorax. The heart is enlarged. A left-sided chest port ends about the cavoatrial junction. No acute osseous abnormalities are seen. IMPRESSION: Mild vascular congestion and cardiomegaly noted. Mild bibasilar atelectasis seen. No displaced rib fractures identified. Electronically Signed   By: Garald Balding M.D.   On: 09/18/2015 02:17   Dg Chest 2 View  Result Date: 09/01/2015 CLINICAL DATA:  Sickle cell crisis.  Hypoxemia.  Weakness today. EXAM: CHEST  2 VIEW COMPARISON:  08/28/2015 FINDINGS: Left-sided Port-A-Cath tip to the level of the lower superior vena cava -right atrial  junction. Heart is markedly enlarged and stable in configuration. There is mild pulmonary vascular congestion. No overt edema. There is minimal right lower lobe atelectasis. No pleural effusions or consolidations. Surgical clips are noted in the upper abdomen. Note is made of sclerosis within the right humeral head, raising the question of avascular necrosis. Left humeral head is not imaged today. IMPRESSION: Cardiomegaly and vascular congestion.  Right lower lobe atelectasis. Question avascular necrosis of the right humeral head. Electronically Signed   By: Nolon Nations M.D.   On: 09/01/2015 10:44   Dg Lumbar Spine Complete  Result Date: 09/18/2015 CLINICAL DATA:  Status post fall, with lower back pain radiating to both hips. Initial encounter. EXAM: LUMBAR SPINE - COMPLETE 4+ VIEW COMPARISON:  CT of the abdomen and pelvis from 06/03/2015 FINDINGS: There is no evidence of fracture or subluxation. Vertebral bodies demonstrate normal height and alignment. Intervertebral disc spaces are preserved. The visualized neural foramina are grossly unremarkable in appearance. A right hip arthroplasty is only partially imaged but appears grossly unremarkable. The visualized bowel gas pattern is unremarkable in appearance; air and stool are noted within the colon. The sacroiliac joints are within normal limits. Clips are noted within the right upper quadrant, reflecting prior cholecystectomy. IMPRESSION: No evidence of fracture or subluxation along the lumbar spine. Electronically Signed   By: Garald Balding M.D.   On: 09/18/2015 02:19   Dg Hip Unilat W Or Wo Pelvis 2-3 Views Right  Result Date: 09/18/2015 CLINICAL DATA:  Status post fall, landing on right side, with right hip pain. Initial encounter. EXAM: DG HIP (WITH OR WITHOUT PELVIS) 2-3V RIGHT COMPARISON:  None. FINDINGS: There is no evidence of fracture or dislocation. The patient's right hip arthroplasty is grossly unremarkable in appearance, without evidence of loosening. Underlying screws are noted along the right acetabulum. Both femoral heads are seated normally within their respective acetabula. The proximal right femur appears intact. No significant degenerative change is appreciated. The sacroiliac joints are unremarkable in appearance. The visualized bowel gas pattern is grossly unremarkable in appearance. IMPRESSION: No evidence of fracture or dislocation. Right hip arthroplasty is grossly intact, without evidence of loosening. Electronically Signed   By: Garald Balding M.D.   On: 09/18/2015 02:16     Signed:  [name] MD, MHA, [place], [place], CPE   09/29/2015, 5:00 PM

## 2015-09-29 NOTE — Progress Notes (Signed)
Pt admitted to [facility] after walking in for triage this morning.  Pt stated his pain was in his ribs and sides and that his pain # was 8/10.  Pt was treated with IV fluids, IV toradol and Dilaudid PCA. Pt tolerate the treatment fairly well. Was down to 6/10 at discharge. Talked to patient about going home and continuing his regular medications and drinking plenty of fluids and the pain increases to give [place] a  Call in the morning and he agreed. Went over discharge instructions and patient declined to get a copy.  Porta cath was flushed and deaccessed per protocol.  Pt was alert, oriented and ambulatory at discharge. Discharged to home with a ride.

## 2015-09-29 NOTE — H&P (Signed)
[facility] History and Physical  [name] [id number] DOB: November 03, 1980 DOA: 09/29/2015  PCP: [name], MD   Chief Complaint: Pain in lower back and legs  HPI: [name] is a 35 y.o. male with history of sickle cell disease, acute chest syndrome, DVT, PE on Xarelto, pulmonary hypertension, cor pulmonale, chronic respiratory failure on home oxygen, who presented to the day hospital today complaining of pain in bilateral ribs, bilateral flank area and lower back. Patient was seen here yesterday after discharge from the emergency department, he was treated for his pain whole day, discharge home in the evening after some improvement. He however said his pain is not controlled, he woke up this morning with similar pain, rating pain intensity at 9/10.Patient denies chest pain, shortness of breath, difficulty breathing.Patient denies N/V/D and abdominal pain.Patient states he has taken his home pain medication without improvement before presenting to the ED. No urinary symptom.  Systemic Review: General: The patient denies anorexia, fever, weight loss Cardiac: Denies chest pain, syncope, palpitations, pedal edema  Respiratory: Denies cough, shortness of breath, wheezing GI: Denies severe indigestion/heartburn, abdominal pain, nausea, vomiting, diarrhea and constipation GU: Denies hematuria, incontinence, dysuria  Musculoskeletal: Denies arthritis  Skin: Denies suspicious skin lesions Neurologic: Denies focal weakness or numbness, change in vision  Past Medical History:  Diagnosis Date  . Acute chest syndrome ([place]) 06/18/2013  . Acute embolism and thrombosis of right internal jugular vein ([place])   . Alcohol consumption of one to four drinks per day   . Avascular necrosis (HCC)    Right Hip  . Blood transfusion   . Chronic anticoagulation   . Demand ischemia ([place]) 01/02/2014  . Former smoker   . Functional asplenia   . Hb-SS disease with crisis ([place])    . History of Clostridium difficile infection   . History of pulmonary embolus (PE)   . Hypertension   . Hypokalemia   . Leukocytosis    Chronic  . Mood disorder ([place])   . Noncompliance with medication regimen   . Oxygen deficiency   . Pulmonary hypertension ([place])   . Second hand tobacco smoke exposure   . Sickle cell anemia (HCC)   . Sickle-cell crisis with associated acute chest syndrome ([place]) 05/13/2013  . Thrombocytosis (HCC)    Chronic  . Uses marijuana     Past Surgical History:  Procedure Laterality Date  . CHOLECYSTECTOMY     01/2008  . Excision of left periauricular cyst     10/2009  . Excision of right ear lobe cyst with primary closur     11/2007  . Porta cath placement    . Porta cath removal    . PORTACATH PLACEMENT  01/05/2012   Procedure: INSERTION PORT-A-CATH;  Surgeon: Odis Hollingshead, MD;  Location: [facility];  Service: General;  Laterality: N/A;  ultrasound guiced port a cath insertion with fluoroscopy  . Right hip replacement     08/2006  . UMBILICAL HERNIA REPAIR     01/2008    No Known Allergies  Family History  Problem Relation Age of Onset  . Sickle cell trait Mother   . Depression Mother   . Diabetes Mother   . Sickle cell trait Father   . Sickle cell trait Brother       Prior to Admission medications   Medication Sig Start Date End Date Taking? Authorizing Provider  ambrisentan (LETAIRIS) 5 MG tablet Take 5 mg by mouth at bedtime.  Yes Historical Provider, MD  aspirin 81 MG chewable tablet Chew 1 tablet (81 mg total) by mouth daily. 07/23/14  Yes [name], MD  Cholecalciferol (VITAMIN D) 2000 units tablet Take 1 tablet (2,000 Units total) by mouth daily. 02/23/15  Yes Dorena Dew, FNP  Deferasirox (JADENU) 360 MG TABS Take 1,080 mg by mouth daily.    Yes Historical Provider, MD  folic acid (FOLVITE) 1 MG tablet Take 1 tablet (1 mg total) by mouth every morning. 02/23/15  Yes Dorena Dew, FNP  gabapentin (NEURONTIN) 300  MG capsule Take 1 capsule (300 mg total) by mouth 3 (three) times daily. 06/01/15  Yes Tresa Garter, MD  HYDROmorphone (DILAUDID) 4 MG tablet Take 1 tablet (4 mg total) by mouth every 4 (four) hours as needed for severe pain. 09/26/15  Yes Shary Decamp, PA-C  lisinopril (PRINIVIL,ZESTRIL) 5 MG tablet Take 1 tablet (5 mg total) by mouth daily. 08/04/15  Yes [name], MD  morphine (MSIR) 30 MG tablet Take 1 tablet (30 mg total) by mouth every 12 (twelve) hours. 09/26/15  Yes Shary Decamp, PA-C  rivaroxaban (XARELTO) 20 MG TABS tablet Take 20 mg by mouth daily with supper.   Yes Historical Provider, MD  zolpidem (AMBIEN) 10 MG tablet Take 1 tablet (10 mg total) by mouth at bedtime as needed for sleep. 09/07/15  Yes Tresa Garter, MD  potassium chloride SA (K-DUR,KLOR-CON) 20 MEQ tablet Take 1 tablet (20 mEq total) by mouth every morning. Patient not taking: Reported on 09/28/2015 04/13/15   Dorena Dew, FNP  torsemide (DEMADEX) 20 MG tablet Take 20 mg by mouth daily as needed. For increase in patient weight greater than 3 lbs. 07/27/15 07/26/16  Historical Provider, MD     Physical Exam: Vitals:   09/29/15 0820  BP: 109/70  Pulse: 66  Resp: 20  Temp: 98.8 F (37.1 C)  TempSrc: Oral  SpO2: 98%  Weight: 173 lb (78.5 kg)  Height: 6' (1.829 m)    General: Alert, awake, afebrile, anicteric, not in obvious distress HEENT: Normocephalic and Atraumatic, Mucous membranes pink                PERRLA; EOM intact; No scleral icterus,                 Nares: Patent, Oropharynx: Clear, Fair Dentition                 Neck: FROM, no cervical lymphadenopathy, thyromegaly, carotid bruit or JVD;  CHEST WALL: No tenderness  CHEST: Normal respiration, clear to auscultation bilaterally  HEART: Regular rate and rhythm; no murmurs rubs or gallops  BACK: No kyphosis or scoliosis; no CVA tenderness  ABDOMEN: Positive Bowel Sounds, soft, non-tender; no masses, no organomegaly EXTREMITIES: No  cyanosis, clubbing, or edema SKIN:  no rash or ulceration  CNS: Alert and Oriented x 4, Nonfocal exam, CN 2-12 intact  Labs on Admission:  Basic Metabolic Panel:  Recent Labs Lab 09/26/15 0317 09/28/15 0814  NA 135 137  K 3.9 3.8  CL 107 107  CO2 23 24  GLUCOSE 106* 96  BUN 14 10  CREATININE 0.89 0.65  CALCIUM 9.1 9.1   Liver Function Tests:  Recent Labs Lab 09/26/15 0317 09/28/15 0814  AST 27 24  ALT 15* 14*  ALKPHOS 76 74  BILITOT 3.2* 2.9*  PROT 8.0 7.7  ALBUMIN 4.6 4.3   No results for input(s): LIPASE, AMYLASE in the last 168 hours. No  results for input(s): AMMONIA in the last 168 hours. CBC:  Recent Labs Lab 09/23/15 0950 09/26/15 0317 09/28/15 0814  WBC 12.0* 15.6* 12.9*  NEUTROABS 9.2* 11.6* 9.7*  HGB 7.0* 7.2* 7.0*  HCT 21.0* 21.7* 20.7*  MCV 89.0 90.4 91.6  PLT 509* 575* 564*   Cardiac Enzymes: No results for input(s): CKTOTAL, CKMB, CKMBINDEX, TROPONINI in the last 168 hours.  BNP (last 3 results)  Recent Labs  11/30/14 0910 08/02/15 2307  BNP 639.7* 503.2*    ProBNP (last 3 results) No results for input(s): PROBNP in the last 8760 hours.  CBG: No results for input(s): GLUCAP in the last 168 hours.   Assessment/Plan Active Problems:   Sickle cell anemia with pain ([place])   Admits to the Day Hospital  IVF D5 .45% Saline @ 125 mls/hour  Weight based Dilaudid PCA started within 30 minutes of admission  IV Toradol 30 mg Q 6 H  Monitor vitals very closely, Re-evaluate pain scale every hour  2 L of Oxygen by [place]  Patient will be re-evaluated for pain in the context of function and relationship to baseline as care progresses.  If no significant relieve from pain (remains above 5/10) will transfer patient to inpatient services for further evaluation and management  Code Status: Full  Family Communication: None  DVT Prophylaxis: Ambulate as tolerated   Time spent: 68 Minutes  Johnella Crumm, MD, MHA, FACP, FAAP,  CPE  If 7PM-7AM, please contact night-coverage [email or web address] 09/29/2015, 8:33 AM

## 2015-10-01 ENCOUNTER — Emergency Department (HOSPITAL_COMMUNITY)
Admission: EM | Admit: 2015-10-01 | Discharge: 2015-10-01 | Disposition: A | Discharge status: Home / Self Care | Payer: Medicare Other | Attending: Emergency Medicine | Admitting: Emergency Medicine

## 2015-10-01 ENCOUNTER — Encounter (HOSPITAL_COMMUNITY): Payer: Self-pay

## 2015-10-01 ENCOUNTER — Telehealth (HOSPITAL_COMMUNITY): Payer: Self-pay | Admitting: *Deleted

## 2015-10-01 DIAGNOSIS — Z87891 Personal history of nicotine dependence: Secondary | ICD-10-CM | POA: Insufficient documentation

## 2015-10-01 DIAGNOSIS — G8929 Other chronic pain: Secondary | ICD-10-CM | POA: Insufficient documentation

## 2015-10-01 DIAGNOSIS — I10 Essential (primary) hypertension: Secondary | ICD-10-CM | POA: Diagnosis not present

## 2015-10-01 DIAGNOSIS — D5700 Hb-SS disease with crisis, unspecified: Secondary | ICD-10-CM | POA: Diagnosis not present

## 2015-10-01 DIAGNOSIS — Z7982 Long term (current) use of aspirin: Secondary | ICD-10-CM | POA: Insufficient documentation

## 2015-10-01 DIAGNOSIS — Z79899 Other long term (current) drug therapy: Secondary | ICD-10-CM | POA: Diagnosis not present

## 2015-10-01 LAB — COMPREHENSIVE METABOLIC PANEL
ALBUMIN: 4.2 g/dL (ref 3.5–5.0)
ALT: 13 U/L — ABNORMAL LOW (ref 17–63)
ANION GAP: 4 — AB (ref 5–15)
AST: 21 U/L (ref 15–41)
Alkaline Phosphatase: 81 U/L (ref 38–126)
BILIRUBIN TOTAL: 3.3 mg/dL — AB (ref 0.3–1.2)
BUN: 7 mg/dL (ref 6–20)
CALCIUM: 9.1 mg/dL (ref 8.9–10.3)
CO2: 23 mmol/L (ref 22–32)
Chloride: 108 mmol/L (ref 101–111)
Creatinine, Ser: 0.73 mg/dL (ref 0.61–1.24)
GFR calc non Af Amer: >60 mL/min (ref >60)
GLUCOSE: 99 mg/dL (ref 65–99)
POTASSIUM: 3.7 mmol/L (ref 3.5–5.1)
SODIUM: 135 mmol/L (ref 135–145)
TOTAL PROTEIN: 7.8 g/dL (ref 6.5–8.1)

## 2015-10-01 LAB — CBC WITH DIFFERENTIAL/PLATELET
BASOS ABS: 0.1 10*3/uL (ref 0.0–0.1)
Basophils Relative: 1 %
EOS ABS: 0.1 10*3/uL (ref 0.0–0.7)
Eosinophils Relative: 1 %
HCT: 21.7 % — ABNORMAL LOW (ref 39.0–52.0)
HEMOGLOBIN: 7.4 g/dL — AB (ref 13.0–17.0)
LYMPHS ABS: 1.6 10*3/uL (ref 0.7–4.0)
Lymphocytes Relative: 12 %
MCH: 31.4 pg (ref 26.0–34.0)
MCHC: 34.1 g/dL (ref 30.0–36.0)
MCV: 91.9 fL (ref 78.0–100.0)
MONO ABS: 1.6 10*3/uL — AB (ref 0.1–1.0)
MONOS PCT: 12 %
NEUTROS ABS: 9.9 10*3/uL — AB (ref 1.7–7.7)
Neutrophils Relative %: 74 %
PLATELETS: 567 10*3/uL — AB (ref 150–400)
RBC: 2.36 MIL/uL — AB (ref 4.22–5.81)
RDW: 21.2 % — AB (ref 11.5–15.5)
WBC: 13.3 10*3/uL — AB (ref 4.0–10.5)

## 2015-10-01 LAB — RETICULOCYTES
RBC.: 2.36 MIL/uL — AB (ref 4.22–5.81)
RETIC COUNT ABSOLUTE: 205.3 10*3/uL — AB (ref 19.0–186.0)
Retic Ct Pct: 8.7 % — ABNORMAL HIGH (ref 0.4–3.1)

## 2015-10-01 MED ORDER — HYDROMORPHONE HCL 2 MG/ML IJ SOLN
2.0000 mg | INTRAMUSCULAR | Status: DC | PRN
  Administered 2015-10-01 (×3): 2 mg via INTRAVENOUS
  Filled 2015-10-01 (×3): qty 1

## 2015-10-01 MED ORDER — HEPARIN SOD (PORK) LOCK FLUSH 100 UNIT/ML IV SOLN
500.0000 [IU] | Freq: Once | INTRAVENOUS | Status: AC
  Administered 2015-10-01: 500 [IU]
  Filled 2015-10-01: qty 5

## 2015-10-01 NOTE — ED Triage Notes (Signed)
Pt presents with c/o bilateral rib pain that he reports is associated with his sickle cell pain. Denies any recent injuries.

## 2015-10-01 NOTE — ED Provider Notes (Signed)
[facility] DEPT Provider Note   CSN: [id number] Arrival date & time: 10/01/15  0805     History   Chief Complaint Chief Complaint  Patient presents with  . Sickle Cell Pain Crisis    HPI PERRY MARRANO is a 35 y.o. male.  HPI   35 year old male with history of sickle cell disease, presenting to ED with complaint of sickle cell related pain. Patient was seen and treated for his sickle cell pain 3 days ago. He reported improvement for one day but started to develop bilateral flank pain since last night. Pain is described as a sharp achy sensation, similar to prior sickle cell crisis. Pain is been persistent not improve despite using his home medication including MS Contin and hydromorphone. He denies having fever, chills, productive cough, shortness of breath, dysuria, hematuria, or rash. No aggravating or alleviating factor. He has no other complaint.  Past Medical History:  Diagnosis Date  . Acute chest syndrome ([place]) 06/18/2013  . Acute embolism and thrombosis of right internal jugular vein ([place])   . Alcohol consumption of one to four drinks per day   . Avascular necrosis (HCC)    Right Hip  . Blood transfusion   . Chronic anticoagulation   . Demand ischemia ([place]) 01/02/2014  . Former smoker   . Functional asplenia   . Hb-SS disease with crisis ([place])   . History of Clostridium difficile infection   . History of pulmonary embolus (PE)   . Hypertension   . Hypokalemia   . Leukocytosis    Chronic  . Mood disorder ([place])   . Noncompliance with medication regimen   . Oxygen deficiency   . Pulmonary hypertension ([place])   . Second hand tobacco smoke exposure   . Sickle cell anemia (HCC)   . Sickle-cell crisis with associated acute chest syndrome ([place]) 05/13/2013  . Thrombocytosis (HCC)    Chronic  . Uses marijuana     Patient Active Problem List   Diagnosis Date Noted  . Sickle cell pain crisis ([place]) 09/18/2015  . Sickle cell anemia with crisis ([place]) 09/04/2015  .  Shortness of breath   . Pulmonary embolism without acute cor pulmonale ([place])   . Fever   . Sickle cell anemia with pain ([place]) 05/07/2015  . Hypoxia   . Anemia 01/31/2015  . Symptomatic anemia   . Hb-SS disease without crisis ([place]) 10/07/2014  . PVC's (premature ventricular contractions) 09/10/2014  . Abnormal EKG 09/10/2014  . Chronic atrial fibrillation ([place]) 09/09/2014  . Cor pulmonale, chronic ([place]) 08/25/2014  . Sickle cell anemia ([place]) 06/25/2014  . Anemia of chronic disease 06/25/2014  . PAH (pulmonary artery hypertension) ([place]) 03/18/2014  . Hb-SS disease with crisis ([place]) 01/22/2014  . Paralytic strabismus, external ophthalmoplegia   . Chronic pain syndrome 12/12/2013  . Chronic anticoagulation 08/22/2013  . Essential hypertension 08/22/2013  . Pulmonary HTN ([place]) 06/18/2013  . Functional asplenia   . Vitamin D deficiency 02/13/2013  . Hx of pulmonary embolus 06/29/2012  . Hemochromatosis 12/14/2011  . Avascular necrosis [facility])     Past Surgical History:  Procedure Laterality Date  . CHOLECYSTECTOMY     01/2008  . Excision of left periauricular cyst     10/2009  . Excision of right ear lobe cyst with primary closur     11/2007  . Porta cath placement    . Porta cath removal    . PORTACATH PLACEMENT  01/05/2012   Procedure: INSERTION PORT-A-CATH;  Surgeon: Odis Hollingshead, MD;  Location: [facility] OR;  Service: General;  Laterality: N/A;  ultrasound guiced port a cath insertion with fluoroscopy  . Right hip replacement     08/2006  . UMBILICAL HERNIA REPAIR     01/2008       Home Medications    Prior to Admission medications   Medication Sig Start Date End Date Taking? Authorizing Provider  ambrisentan (LETAIRIS) 5 MG tablet Take 5 mg by mouth at bedtime.     Historical Provider, MD  aspirin 81 MG chewable tablet Chew 1 tablet (81 mg total) by mouth daily. 07/23/14   [name], MD  Cholecalciferol (VITAMIN D) 2000 units tablet Take 1 tablet (2,000 Units  total) by mouth daily. 02/23/15   Dorena Dew, FNP  Deferasirox (JADENU) 360 MG TABS Take 1,080 mg by mouth daily.     Historical Provider, MD  folic acid (FOLVITE) 1 MG tablet Take 1 tablet (1 mg total) by mouth every morning. 02/23/15   Dorena Dew, FNP  gabapentin (NEURONTIN) 300 MG capsule Take 1 capsule (300 mg total) by mouth 3 (three) times daily. 06/01/15   Tresa Garter, MD  HYDROmorphone (DILAUDID) 4 MG tablet Take 1 tablet (4 mg total) by mouth every 4 (four) hours as needed for severe pain. 09/26/15   Shary Decamp, PA-C  lisinopril (PRINIVIL,ZESTRIL) 5 MG tablet Take 1 tablet (5 mg total) by mouth daily. 08/04/15   [name], MD  morphine (MSIR) 30 MG tablet Take 1 tablet (30 mg total) by mouth every 12 (twelve) hours. 09/26/15   Shary Decamp, PA-C  potassium chloride SA (K-DUR,KLOR-CON) 20 MEQ tablet Take 1 tablet (20 mEq total) by mouth every morning. Patient not taking: Reported on 09/28/2015 04/13/15   Dorena Dew, FNP  rivaroxaban (XARELTO) 20 MG TABS tablet Take 20 mg by mouth daily with supper.    Historical Provider, MD  torsemide (DEMADEX) 20 MG tablet Take 20 mg by mouth daily as needed. For increase in patient weight greater than 3 lbs. 07/27/15 07/26/16  Historical Provider, MD  zolpidem (AMBIEN) 10 MG tablet Take 1 tablet (10 mg total) by mouth at bedtime as needed for sleep. 09/07/15   Tresa Garter, MD    Family History Family History  Problem Relation Age of Onset  . Sickle cell trait Mother   . Depression Mother   . Diabetes Mother   . Sickle cell trait Father   . Sickle cell trait Brother     Social History Social History  Substance Use Topics  . Smoking status: Former Smoker    Packs/day: 0.50    Years: 10.00    Types: Cigarettes    Quit date: 05/29/2011  . Smokeless tobacco: Never Used  . Alcohol use No     Allergies   Review of patient's allergies indicates no known allergies.   Review of Systems Review of Systems  All  other systems reviewed and are negative.    Physical Exam Updated Vital Signs BP 109/71 (BP Location: Left Arm)   Pulse 73   Temp 98.4 F (36.9 C) (Oral)   Resp 18   Ht 6' (1.829 m)   Wt 78.5 kg   SpO2 97%   BMI 23.46 kg/m   Physical Exam  Constitutional: He is oriented to person, place, and time. He appears well-developed and well-nourished. No distress.  Chronically ill appearing male laying bed in no acute discomfort.  HENT:  Head: Atraumatic.  Eyes: Conjunctivae are normal.  Neck:  Neck supple.  Cardiovascular: Normal rate, regular rhythm and intact distal pulses.   Pulmonary/Chest: Effort normal and breath sounds normal.  Abdominal: Soft. There is no tenderness.  Genitourinary:  Genitourinary Comments: No CVA tenderness  Musculoskeletal: He exhibits no tenderness.  Neurological: He is alert and oriented to person, place, and time.  Skin: No rash noted.  Psychiatric: He has a normal mood and affect.  Nursing note and vitals reviewed.    ED Treatments / Results  Labs (all labs ordered are listed, but only abnormal results are displayed) Labs Reviewed  COMPREHENSIVE METABOLIC PANEL - Abnormal; Notable for the following:       Result Value   ALT 13 (*)    Total Bilirubin 3.3 (*)    Anion gap 4 (*)    All other components within normal limits  RETICULOCYTES - Abnormal; Notable for the following:    Retic Ct Pct 8.7 (*)    RBC. 2.36 (*)    Retic Count, Manual 205.3 (*)    All other components within normal limits  CBC WITH DIFFERENTIAL/PLATELET - Abnormal; Notable for the following:    WBC 13.3 (*)    RBC 2.36 (*)    Hemoglobin 7.4 (*)    HCT 21.7 (*)    RDW 21.2 (*)    Platelets 567 (*)    Neutro Abs 9.9 (*)    Monocytes Absolute 1.6 (*)    All other components within normal limits    EKG  EKG Interpretation None       Radiology No results found.  Procedures Procedures (including critical care time)  Medications Ordered in ED Medications -  No data to display   Initial Impression / Assessment and Plan / ED Course  I have reviewed the triage vital signs and the nursing notes.  Pertinent labs & imaging results that were available during my care of the patient were reviewed by me and considered in my medical decision making (see chart for details).  Clinical Course    BP 101/72 (BP Location: Left Arm)   Pulse 73   Temp 98.4 F (36.9 C) (Oral)   Resp 20   Ht 6' (1.829 m)   Wt 69.3 kg   SpO2 96%   BMI 20.72 kg/m    Final Clinical Impressions(s) / ED Diagnoses   Final diagnoses:  Chronic pain    New Prescriptions New Prescriptions   No medications on file   8:34 AM Patient with history of sickle cell disease, frequent ER visits for sickle cell related pain is here again for pain similar to his prior sickle cell crisis. No fever, chest pain, shortness of breath or productive cough concerning for acute chest. His last visit was 08/28 at which time his hemoglobin is 7.0. This is his baseline Hgb.  I have consulted pt's PCP, Dr. Doreene Burke who felt pt may need admission for further evaluation of his sickle cell pain as he has been seen in the office multiple times in the office this past week.  I discussed this with Dr. Eulis Foster, plan to perform a work up and check labs.     10:25 AM Exacerbation of chronic pain.  Dr. Eulis Foster has seen and evaluated pt. Will provide pain management.   11:56 AM Pt received 3 doses of pain medication.  I recommend pt to f/u closely with his PCP for further care.  Continue to take his home meds.  Return precaution discussed. Otherwise, labs are at baseline.     Gertie Fey  Rona Ravens, PA-C 10/01/15 1157    Daleen Bo, MD 10/01/15 1452

## 2015-10-01 NOTE — ED Provider Notes (Signed)
  Face-to-face evaluation   History:   Patient presents for evaluation of a painful crisis. He was hospitalized here earlier this month, and also seen in the ED 3 days ago and sent for evaluation in the sickle cell clinic. Today he is here for "side pain". He states that he took his usual medications, as prescribed, for pain, without relief. He is due to have red cell exchange apheresis, on 10/06/2015. He states that his transfusion threshold hemoglobin, is 6.5  His last visit at the [facility], hematology service, for red cell exchange apheresis, was 09/07/2015. He typically requires his treatment when he has shortness of breath and a drop in hemoglobin.  Physical exam: Alert, calm, cooperative. Lungs clear bilaterally without wheezes, Rales or rhonchi. Heart regular rate and rhythm. He moves arms and legs equally, normally.  Medical screening examination/treatment/procedure(s) were conducted as a shared visit with non-physician practitioner(s) and myself.  I personally evaluated the patient during the encounter   Daleen Bo, MD 10/01/15 1453

## 2015-10-01 NOTE — Discharge Instructions (Signed)
Please continue taking your pain medication and follow up with your primary care provider for further management of your condition.

## 2015-10-01 NOTE — ED Notes (Signed)
Pt states he took 4mg  of Dilaudid this AM

## 2015-10-04 ENCOUNTER — Emergency Department (HOSPITAL_COMMUNITY)
Admission: EM | Admit: 2015-10-04 | Discharge: 2015-10-04 | Disposition: A | Discharge status: Home / Self Care | Payer: Medicare Other | Source: Home / Self Care | Attending: Emergency Medicine | Admitting: Emergency Medicine

## 2015-10-04 ENCOUNTER — Encounter (HOSPITAL_COMMUNITY): Payer: Self-pay | Admitting: Emergency Medicine

## 2015-10-04 DIAGNOSIS — Q8901 Asplenia (congenital): Secondary | ICD-10-CM

## 2015-10-04 DIAGNOSIS — Z96641 Presence of right artificial hip joint: Secondary | ICD-10-CM | POA: Diagnosis not present

## 2015-10-04 DIAGNOSIS — D638 Anemia in other chronic diseases classified elsewhere: Secondary | ICD-10-CM | POA: Diagnosis not present

## 2015-10-04 DIAGNOSIS — I482 Chronic atrial fibrillation: Secondary | ICD-10-CM | POA: Diagnosis not present

## 2015-10-04 DIAGNOSIS — Z86711 Personal history of pulmonary embolism: Secondary | ICD-10-CM

## 2015-10-04 DIAGNOSIS — Z9114 Patient's other noncompliance with medication regimen: Secondary | ICD-10-CM | POA: Diagnosis not present

## 2015-10-04 DIAGNOSIS — R0902 Hypoxemia: Secondary | ICD-10-CM | POA: Diagnosis present

## 2015-10-04 DIAGNOSIS — G894 Chronic pain syndrome: Secondary | ICD-10-CM | POA: Diagnosis present

## 2015-10-04 DIAGNOSIS — Z9981 Dependence on supplemental oxygen: Secondary | ICD-10-CM | POA: Diagnosis not present

## 2015-10-04 DIAGNOSIS — Z79899 Other long term (current) drug therapy: Secondary | ICD-10-CM

## 2015-10-04 DIAGNOSIS — Z7982 Long term (current) use of aspirin: Secondary | ICD-10-CM

## 2015-10-04 DIAGNOSIS — Z7901 Long term (current) use of anticoagulants: Secondary | ICD-10-CM

## 2015-10-04 DIAGNOSIS — D5700 Hb-SS disease with crisis, unspecified: Secondary | ICD-10-CM | POA: Diagnosis not present

## 2015-10-04 DIAGNOSIS — Z87891 Personal history of nicotine dependence: Secondary | ICD-10-CM | POA: Insufficient documentation

## 2015-10-04 DIAGNOSIS — G8929 Other chronic pain: Secondary | ICD-10-CM | POA: Insufficient documentation

## 2015-10-04 DIAGNOSIS — I10 Essential (primary) hypertension: Secondary | ICD-10-CM | POA: Insufficient documentation

## 2015-10-04 DIAGNOSIS — Z86718 Personal history of other venous thrombosis and embolism: Secondary | ICD-10-CM

## 2015-10-04 DIAGNOSIS — M791 Myalgia: Secondary | ICD-10-CM | POA: Insufficient documentation

## 2015-10-04 DIAGNOSIS — Z9049 Acquired absence of other specified parts of digestive tract: Secondary | ICD-10-CM | POA: Diagnosis not present

## 2015-10-04 DIAGNOSIS — R0781 Pleurodynia: Secondary | ICD-10-CM | POA: Diagnosis not present

## 2015-10-04 DIAGNOSIS — I272 Other secondary pulmonary hypertension: Secondary | ICD-10-CM | POA: Diagnosis not present

## 2015-10-04 DIAGNOSIS — I2781 Cor pulmonale (chronic): Secondary | ICD-10-CM | POA: Diagnosis not present

## 2015-10-04 LAB — CBC WITH DIFFERENTIAL/PLATELET
Basophils Absolute: 0.1 10*3/uL (ref 0.0–0.1)
Basophils Relative: 1 %
EOS ABS: 0.3 10*3/uL (ref 0.0–0.7)
EOS PCT: 2 %
HCT: 23.5 % — ABNORMAL LOW (ref 39.0–52.0)
Hemoglobin: 8.1 g/dL — ABNORMAL LOW (ref 13.0–17.0)
LYMPHS ABS: 1.3 10*3/uL (ref 0.7–4.0)
LYMPHS PCT: 10 %
MCH: 31.4 pg (ref 26.0–34.0)
MCHC: 34.5 g/dL (ref 30.0–36.0)
MCV: 91.1 fL (ref 78.0–100.0)
MONO ABS: 2 10*3/uL — AB (ref 0.1–1.0)
MONOS PCT: 15 %
Neutro Abs: 10.2 10*3/uL — ABNORMAL HIGH (ref 1.7–7.7)
Neutrophils Relative %: 72 %
PLATELETS: 478 10*3/uL — AB (ref 150–400)
RBC: 2.58 MIL/uL — AB (ref 4.22–5.81)
RDW: 20.5 % — AB (ref 11.5–15.5)
WBC: 13.9 10*3/uL — ABNORMAL HIGH (ref 4.0–10.5)

## 2015-10-04 LAB — RETICULOCYTES
RBC.: 2.58 MIL/uL — ABNORMAL LOW (ref 4.22–5.81)
RETIC CT PCT: 5.2 % — AB (ref 0.4–3.1)
Retic Count, Absolute: 134.2 10*3/uL (ref 19.0–186.0)

## 2015-10-04 LAB — I-STAT CHEM 8, ED
BUN: 14 mg/dL (ref 6–20)
CALCIUM ION: 1.25 mmol/L (ref 1.15–1.40)
CHLORIDE: 107 mmol/L (ref 101–111)
Creatinine, Ser: 1.2 mg/dL (ref 0.61–1.24)
GLUCOSE: 105 mg/dL — AB (ref 65–99)
HCT: 23 % — ABNORMAL LOW (ref 39.0–52.0)
Hemoglobin: 7.8 g/dL — ABNORMAL LOW (ref 13.0–17.0)
Potassium: 3.8 mmol/L (ref 3.5–5.1)
Sodium: 144 mmol/L (ref 135–145)
TCO2: 24 mmol/L (ref 0–100)

## 2015-10-04 MED ORDER — HEPARIN SOD (PORK) LOCK FLUSH 100 UNIT/ML IV SOLN
500.0000 [IU] | Freq: Once | INTRAVENOUS | Status: AC
  Administered 2015-10-04: 500 [IU]
  Filled 2015-10-04: qty 5

## 2015-10-04 MED ORDER — KETOROLAC TROMETHAMINE 30 MG/ML IJ SOLN
30.0000 mg | Freq: Once | INTRAMUSCULAR | Status: AC
  Administered 2015-10-04: 30 mg via INTRAVENOUS
  Filled 2015-10-04: qty 1

## 2015-10-04 MED ORDER — HYDROMORPHONE HCL 2 MG/ML IJ SOLN
2.0000 mg | Freq: Once | INTRAMUSCULAR | Status: AC
  Administered 2015-10-04: 2 mg via INTRAVENOUS
  Filled 2015-10-04: qty 1

## 2015-10-04 MED ORDER — DIPHENHYDRAMINE HCL 25 MG PO CAPS
25.0000 mg | ORAL_CAPSULE | Freq: Once | ORAL | Status: AC
  Administered 2015-10-04: 25 mg via ORAL
  Filled 2015-10-04: qty 1

## 2015-10-04 MED ORDER — SODIUM CHLORIDE 0.9 % IV BOLUS (SEPSIS)
500.0000 mL | Freq: Once | INTRAVENOUS | Status: AC
  Administered 2015-10-04: 500 mL via INTRAVENOUS

## 2015-10-04 NOTE — ED Triage Notes (Signed)
Pt c/o bilat rib pain and L leg pain onset 0100. Pt took Dilaudid 4mg  PO without relief.

## 2015-10-04 NOTE — ED Notes (Signed)
Patient given fluids 

## 2015-10-04 NOTE — ED Provider Notes (Signed)
[facility] DEPT Provider Note   CSN: [id number] Arrival date & time: 10/04/15  [id number]  By signing my name below, I, Jasmyn B. Alexander, attest that this documentation has been prepared under the direction and in the presence of Roosevelt Eimers, MD. Electronically Signed: Tedra Coupe. Sheppard Coil, ED Scribe. 10/04/15. 3:52 AM.  History   Chief Complaint Chief Complaint  Patient presents with  . Sickle Cell Pain Crisis    The history is provided by the patient. No language interpreter was used.  Sickle Cell Pain Crisis  Location:  L side Severity:  Moderate Onset quality:  Gradual Duration:  3 hours Similar to previous crisis episodes: yes   Timing:  Constant Progression:  Unchanged Chronicity:  Chronic Sickle cell genotype:  SS Worsened by:  Nothing Ineffective treatments:  Prescription drugs Associated symptoms: no fever     HPI Comments: [name] is a 35 y.o. male with PMHx of sickle cell anemia type SS who presents to the Emergency Department complaining of gradual onset, constant, unchanged bilateral rib pain and left leg pain x 0100. He states that pain is due to sickle cell anemia and is similar to past episodes. Pt reports that he takes Dilaudid and Morphine for pain but it has not relieved his symptoms at this time. He has been seen in [facility] numerous times for same complaint. Denies any other complaints.   Past Medical History:  Diagnosis Date  . Acute chest syndrome ([place]) 06/18/2013  . Acute embolism and thrombosis of right internal jugular vein ([place])   . Alcohol consumption of one to four drinks per day   . Avascular necrosis (HCC)    Right Hip  . Blood transfusion   . Chronic anticoagulation   . Demand ischemia ([place]) 01/02/2014  . Former smoker   . Functional asplenia   . Hb-SS disease with crisis ([place])   . History of Clostridium difficile infection   . History of pulmonary embolus (PE)   . Hypertension   . Hypokalemia   . Leukocytosis    Chronic  . Mood  disorder ([place])   . Noncompliance with medication regimen   . Oxygen deficiency   . Pulmonary hypertension ([place])   . Second hand tobacco smoke exposure   . Sickle cell anemia (HCC)   . Sickle-cell crisis with associated acute chest syndrome ([place]) 05/13/2013  . Thrombocytosis (HCC)    Chronic  . Uses marijuana     Patient Active Problem List   Diagnosis Date Noted  . Sickle cell pain crisis ([place]) 09/18/2015  . Sickle cell anemia with crisis ([place]) 09/04/2015  . Shortness of breath   . Pulmonary embolism without acute cor pulmonale ([place])   . Fever   . Sickle cell anemia with pain ([place]) 05/07/2015  . Hypoxia   . Anemia 01/31/2015  . Symptomatic anemia   . Hb-SS disease without crisis ([place]) 10/07/2014  . PVC's (premature ventricular contractions) 09/10/2014  . Abnormal EKG 09/10/2014  . Chronic atrial fibrillation ([place]) 09/09/2014  . Cor pulmonale, chronic ([place]) 08/25/2014  . Sickle cell anemia ([place]) 06/25/2014  . Anemia of chronic disease 06/25/2014  . PAH (pulmonary artery hypertension) ([place]) 03/18/2014  . Hb-SS disease with crisis ([place]) 01/22/2014  . Paralytic strabismus, external ophthalmoplegia   . Chronic pain syndrome 12/12/2013  . Chronic anticoagulation 08/22/2013  . Essential hypertension 08/22/2013  . Pulmonary HTN ([place]) 06/18/2013  . Functional asplenia   . Vitamin D deficiency 02/13/2013  . Hx of pulmonary embolus 06/29/2012  . Hemochromatosis  12/14/2011  . Avascular necrosis [facility])     Past Surgical History:  Procedure Laterality Date  . CHOLECYSTECTOMY     01/2008  . Excision of left periauricular cyst     10/2009  . Excision of right ear lobe cyst with primary closur     11/2007  . Porta cath placement    . Porta cath removal    . PORTACATH PLACEMENT  01/05/2012   Procedure: INSERTION PORT-A-CATH;  Surgeon: Odis Hollingshead, MD;  Location: [facility];  Service: General;  Laterality: N/A;  ultrasound guiced port a cath insertion with fluoroscopy  . Right hip  replacement     08/2006  . UMBILICAL HERNIA REPAIR     01/2008    Home Medications    Prior to Admission medications   Medication Sig Start Date End Date Taking? Authorizing Provider  ambrisentan (LETAIRIS) 5 MG tablet Take 5 mg by mouth at bedtime.     Historical Provider, MD  aspirin 81 MG chewable tablet Chew 1 tablet (81 mg total) by mouth daily. 07/23/14   [name], MD  Cholecalciferol (VITAMIN D) 2000 units tablet Take 1 tablet (2,000 Units total) by mouth daily. 02/23/15   Dorena Dew, FNP  Deferasirox (JADENU) 360 MG TABS Take 1,080 mg by mouth daily.     Historical Provider, MD  folic acid (FOLVITE) 1 MG tablet Take 1 tablet (1 mg total) by mouth every morning. 02/23/15   Dorena Dew, FNP  gabapentin (NEURONTIN) 300 MG capsule Take 1 capsule (300 mg total) by mouth 3 (three) times daily. 06/01/15   Tresa Garter, MD  HYDROmorphone (DILAUDID) 4 MG tablet Take 1 tablet (4 mg total) by mouth every 4 (four) hours as needed for severe pain. 09/26/15   Shary Decamp, PA-C  lisinopril (PRINIVIL,ZESTRIL) 5 MG tablet Take 1 tablet (5 mg total) by mouth daily. 08/04/15   [name], MD  morphine (MSIR) 30 MG tablet Take 1 tablet (30 mg total) by mouth every 12 (twelve) hours. 09/26/15   Shary Decamp, PA-C  potassium chloride SA (K-DUR,KLOR-CON) 20 MEQ tablet Take 1 tablet (20 mEq total) by mouth every morning. Patient not taking: Reported on 09/28/2015 04/13/15   Dorena Dew, FNP  rivaroxaban (XARELTO) 20 MG TABS tablet Take 20 mg by mouth daily with supper.    Historical Provider, MD  torsemide (DEMADEX) 20 MG tablet Take 20 mg by mouth daily as needed. For increase in patient weight greater than 3 lbs. 07/27/15 07/26/16  Historical Provider, MD  zolpidem (AMBIEN) 10 MG tablet Take 1 tablet (10 mg total) by mouth at bedtime as needed for sleep. 09/07/15   Tresa Garter, MD    Family History Family History  Problem Relation Age of Onset  . Sickle cell trait  Mother   . Depression Mother   . Diabetes Mother   . Sickle cell trait Father   . Sickle cell trait Brother     Social History Social History  Substance Use Topics  . Smoking status: Former Smoker    Packs/day: 0.50    Years: 10.00    Types: Cigarettes    Quit date: 05/29/2011  . Smokeless tobacco: Never Used  . Alcohol use No     Allergies   Review of patient's allergies indicates no known allergies.   Review of Systems Review of Systems  Constitutional: Negative for fever.  Musculoskeletal: Positive for myalgias.  Neurological: Negative for weakness and numbness.  All other  systems reviewed and are negative.  Physical Exam Updated Vital Signs BP 105/71 (BP Location: Left Arm)   Pulse 99   Temp 99 F (37.2 C) (Oral)   Resp 18   SpO2 97%   Physical Exam  Constitutional: He appears well-developed and well-nourished. No distress.  HENT:  Head: Normocephalic.  Mouth/Throat: Oropharynx is clear and moist. No oropharyngeal exudate.  Eyes: Conjunctivae and EOM are normal. Pupils are equal, round, and reactive to light. Right eye exhibits no discharge. Left eye exhibits no discharge. No scleral icterus.  Neck: Normal range of motion. Neck supple. No JVD present. No tracheal deviation present.  Trachea is midline. No stridor or carotid bruits.  Cardiovascular: Normal rate, regular rhythm, normal heart sounds and intact distal pulses.   No murmur heard. Pulmonary/Chest: Effort normal and breath sounds normal. No stridor. No respiratory distress. He has no wheezes. He has no rales.  Lungs CTA bilaterally.  Abdominal: Soft. Bowel sounds are normal. He exhibits no distension and no mass. There is no tenderness. There is no rebound and no guarding.  Musculoskeletal: Normal range of motion. He exhibits no edema, tenderness or deformity.  No edema. All compartments are soft.  Lymphadenopathy:    He has no cervical adenopathy.  Neurological: He is alert. He has normal  reflexes.  Skin: Skin is warm and dry. Capillary refill takes less than 2 seconds.  Psychiatric: He has a normal mood and affect. His behavior is normal.  Nursing note and vitals reviewed.  ED Treatments / Results  DIAGNOSTIC STUDIES: Oxygen Saturation is 97% on RA, normal by my interpretation.    COORDINATION OF CARE: 3:52 AM-Discussed treatment plan which includes order of Dilaudid with pt at bedside and pt agreed to plan.   Labs (all labs ordered are listed, but only abnormal results are displayed) Labs Reviewed  CBC WITH DIFFERENTIAL/PLATELET  RETICULOCYTES  I-STAT CHEM 8, ED   Radiology No results found.  Procedures Procedures (including critical care time)  Medications Ordered in ED Medications  sodium chloride 0.9 % bolus 500 mL (not administered)  HYDROmorphone (DILAUDID) injection 2 mg (not administered)   Initial Impression / Assessment and Plan / ED Course  I have reviewed the triage vital signs and the nursing notes.  Pertinent labs & imaging results that were available during my care of the patient were reviewed by me and considered in my medical decision making (see chart for details).  Clinical Course   Vitals:   10/04/15 0600 10/04/15 0630  BP: 103/74 106/76  Pulse: 72 73  Resp: 16   Temp:     Results for orders placed or performed during the hospital encounter of 10/04/15  CBC with Differential/Platelet  Result Value Ref Range   WBC 13.9 (H) 4.0 - 10.5 K/uL   RBC 2.58 (L) 4.22 - 5.81 MIL/uL   Hemoglobin 8.1 (L) 13.0 - 17.0 g/dL   HCT 23.5 (L) 39.0 - 52.0 %   MCV 91.1 78.0 - 100.0 fL   MCH 31.4 26.0 - 34.0 pg   MCHC 34.5 30.0 - 36.0 g/dL   RDW 20.5 (H) 11.5 - 15.5 %   Platelets 478 (H) 150 - 400 K/uL   Neutrophils Relative % 72 %   Neutro Abs 10.2 (H) 1.7 - 7.7 K/uL   Lymphocytes Relative 10 %   Lymphs Abs 1.3 0.7 - 4.0 K/uL   Monocytes Relative 15 %   Monocytes Absolute 2.0 (H) 0.1 - 1.0 K/uL   Eosinophils Relative 2 %  Eosinophils  Absolute 0.3 0.0 - 0.7 K/uL   Basophils Relative 1 %   Basophils Absolute 0.1 0.0 - 0.1 K/uL  Reticulocytes  Result Value Ref Range   Retic Ct Pct 5.2 (H) 0.4 - 3.1 %   RBC. 2.58 (L) 4.22 - 5.81 MIL/uL   Retic Count, Manual 134.2 19.0 - 186.0 K/uL  I-Stat Chem 8, ED  Result Value Ref Range   Sodium 144 135 - 145 mmol/L   Potassium 3.8 3.5 - 5.1 mmol/L   Chloride 107 101 - 111 mmol/L   BUN 14 6 - 20 mg/dL   Creatinine, Ser 1.20 0.61 - 1.24 mg/dL   Glucose, Bld 105 (H) 65 - 99 mg/dL   Calcium, Ion 1.25 1.15 - 1.40 mmol/L   TCO2 24 0 - 100 mmol/L   Hemoglobin 7.8 (L) 13.0 - 17.0 g/dL   HCT 23.0 (L) 39.0 - 52.0 %   *Note: Due to a large number of results and/or encounters for the requested time period, some results have not been displayed. A complete set of results can be found in Results Review.   Dg Chest 2 View  Result Date: 09/26/2015 CLINICAL DATA:  Bilateral rib pain radiating to back for 2 days. History of hypertension and sickle cell. EXAM: CHEST  2 VIEW COMPARISON:  Chest radiograph September 23, 2015 FINDINGS: The cardiac silhouette is moderately enlarged and unchanged. Mediastinal silhouette is nonsuspicious. Stable mild bibasilar strandy densities, unchanged. No pleural effusion or focal consolidation. No pneumothorax. Single lumen LEFT chest Port-A-Cath with distal tip projecting cavoatrial junction. No pneumothorax. Soft tissue planes included osseous structures are nonsuspicious. Surgical clips in the included right abdomen compatible with cholecystectomy. IMPRESSION: Stable cardiomegaly.  No acute pulmonary process. Electronically Signed   By: Elon Alas M.D.   On: 09/26/2015 04:21   Dg Chest 2 View  Result Date: 09/23/2015 CLINICAL DATA:  Fever today EXAM: CHEST  2 VIEW COMPARISON:  09/18/2015 FINDINGS: Left chest wall port is again seen and stable. Cardiac shadow remains enlarged. The lungs are well aerated bilaterally. Diffuse interstitial changes are again seen stable  from the prior study. No focal infiltrate is seen. IMPRESSION: No acute abnormality noted. Electronically Signed   By: Inez Catalina M.D.   On: 09/23/2015 13:04   Dg Chest 2 View  Result Date: 09/18/2015 CLINICAL DATA:  Status post fall, with upper anterior chest pain. Initial encounter. EXAM: CHEST  2 VIEW COMPARISON:  Chest radiograph performed 09/01/2015 FINDINGS: The lungs are well-aerated. Mild vascular congestion is noted, with mild bibasilar atelectasis. There is no evidence of pleural effusion or pneumothorax. The heart is enlarged. A left-sided chest port ends about the cavoatrial junction. No acute osseous abnormalities are seen. IMPRESSION: Mild vascular congestion and cardiomegaly noted. Mild bibasilar atelectasis seen. No displaced rib fractures identified. Electronically Signed   By: Garald Balding M.D.   On: 09/18/2015 02:17   Dg Lumbar Spine Complete  Result Date: 09/18/2015 CLINICAL DATA:  Status post fall, with lower back pain radiating to both hips. Initial encounter. EXAM: LUMBAR SPINE - COMPLETE 4+ VIEW COMPARISON:  CT of the abdomen and pelvis from 06/03/2015 FINDINGS: There is no evidence of fracture or subluxation. Vertebral bodies demonstrate normal height and alignment. Intervertebral disc spaces are preserved. The visualized neural foramina are grossly unremarkable in appearance. A right hip arthroplasty is only partially imaged but appears grossly unremarkable. The visualized bowel gas pattern is unremarkable in appearance; air and stool are noted within the colon. The sacroiliac joints  are within normal limits. Clips are noted within the right upper quadrant, reflecting prior cholecystectomy. IMPRESSION: No evidence of fracture or subluxation along the lumbar spine. Electronically Signed   By: Garald Balding M.D.   On: 09/18/2015 02:19   Dg Hip Unilat W Or Wo Pelvis 2-3 Views Right  Result Date: 09/18/2015 CLINICAL DATA:  Status post fall, landing on right side, with right hip  pain. Initial encounter. EXAM: DG HIP (WITH OR WITHOUT PELVIS) 2-3V RIGHT COMPARISON:  None. FINDINGS: There is no evidence of fracture or dislocation. The patient's right hip arthroplasty is grossly unremarkable in appearance, without evidence of loosening. Underlying screws are noted along the right acetabulum. Both femoral heads are seated normally within their respective acetabula. The proximal right femur appears intact. No significant degenerative change is appreciated. The sacroiliac joints are unremarkable in appearance. The visualized bowel gas pattern is grossly unremarkable in appearance. IMPRESSION: No evidence of fracture or dislocation. Right hip arthroplasty is grossly intact, without evidence of loosening. Electronically Signed   By: Garald Balding M.D.   On: 09/18/2015 02:16   Medications  sodium chloride 0.9 % bolus 500 mL (500 mLs Intravenous New Bag/Given 10/04/15 0438)  HYDROmorphone (DILAUDID) injection 2 mg (2 mg Intravenous Given 10/04/15 0439)  HYDROmorphone (DILAUDID) injection 2 mg (2 mg Intravenous Given 10/04/15 0548)  ketorolac (TORADOL) 30 MG/ML injection 30 mg (30 mg Intravenous Given 10/04/15 0624)  diphenhydrAMINE (BENADRYL) capsule 25 mg (25 mg Oral Given 10/04/15 0623)  HYDROmorphone (DILAUDID) injection 2 mg (2 mg Intravenous Given 10/04/15 [id number])    Sleeping soundly post medication.   Final Clinical Impressions(s) / ED Diagnoses   Final diagnoses:  None    New Prescriptions New Prescriptions   No medications on file  All questions answered to patient's satisfaction. Based on history and exam patient has been appropriately medically screened and emergency conditions excluded. Patient is stable for discharge at this time. Follow up with your PMD for recheck in 2 days and strict return precautions given.   I personally performed the services described in this documentation, which was scribed in my presence. The recorded information has been reviewed and is accurate.        Veatrice Kells, MD 10/04/15 [phone]

## 2015-10-05 ENCOUNTER — Emergency Department (HOSPITAL_COMMUNITY)
Admission: EM | Admit: 2015-10-05 | Discharge: 2015-10-05 | Disposition: A | Discharge status: Home / Self Care | Payer: Medicare Other | Source: Home / Self Care | Attending: Emergency Medicine | Admitting: Emergency Medicine

## 2015-10-05 ENCOUNTER — Encounter (HOSPITAL_COMMUNITY): Payer: Self-pay | Admitting: Emergency Medicine

## 2015-10-05 ENCOUNTER — Inpatient Hospital Stay (HOSPITAL_COMMUNITY)
Admission: EM | Admit: 2015-10-05 | Discharge: 2015-10-06 | DRG: 812 | Disposition: A | Discharge status: Home / Self Care | Payer: Medicare Other | Attending: Internal Medicine | Admitting: Internal Medicine

## 2015-10-05 DIAGNOSIS — Z86718 Personal history of other venous thrombosis and embolism: Secondary | ICD-10-CM | POA: Diagnosis not present

## 2015-10-05 DIAGNOSIS — Z86711 Personal history of pulmonary embolism: Secondary | ICD-10-CM | POA: Diagnosis not present

## 2015-10-05 DIAGNOSIS — D638 Anemia in other chronic diseases classified elsewhere: Secondary | ICD-10-CM

## 2015-10-05 DIAGNOSIS — Z87891 Personal history of nicotine dependence: Secondary | ICD-10-CM

## 2015-10-05 DIAGNOSIS — D5700 Hb-SS disease with crisis, unspecified: Principal | ICD-10-CM

## 2015-10-05 DIAGNOSIS — Z7901 Long term (current) use of anticoagulants: Secondary | ICD-10-CM | POA: Diagnosis not present

## 2015-10-05 DIAGNOSIS — Q8901 Asplenia (congenital): Secondary | ICD-10-CM | POA: Diagnosis not present

## 2015-10-05 DIAGNOSIS — Z9981 Dependence on supplemental oxygen: Secondary | ICD-10-CM | POA: Diagnosis not present

## 2015-10-05 DIAGNOSIS — Z7982 Long term (current) use of aspirin: Secondary | ICD-10-CM | POA: Insufficient documentation

## 2015-10-05 DIAGNOSIS — R0902 Hypoxemia: Secondary | ICD-10-CM

## 2015-10-05 DIAGNOSIS — Z79899 Other long term (current) drug therapy: Secondary | ICD-10-CM | POA: Insufficient documentation

## 2015-10-05 DIAGNOSIS — R0781 Pleurodynia: Secondary | ICD-10-CM | POA: Diagnosis not present

## 2015-10-05 DIAGNOSIS — Z9049 Acquired absence of other specified parts of digestive tract: Secondary | ICD-10-CM | POA: Diagnosis not present

## 2015-10-05 DIAGNOSIS — I10 Essential (primary) hypertension: Secondary | ICD-10-CM | POA: Insufficient documentation

## 2015-10-05 DIAGNOSIS — Z9114 Patient's other noncompliance with medication regimen: Secondary | ICD-10-CM | POA: Diagnosis not present

## 2015-10-05 DIAGNOSIS — I272 Other secondary pulmonary hypertension: Secondary | ICD-10-CM | POA: Diagnosis present

## 2015-10-05 DIAGNOSIS — J9601 Acute respiratory failure with hypoxia: Secondary | ICD-10-CM | POA: Diagnosis not present

## 2015-10-05 DIAGNOSIS — I2781 Cor pulmonale (chronic): Secondary | ICD-10-CM | POA: Diagnosis present

## 2015-10-05 DIAGNOSIS — G894 Chronic pain syndrome: Secondary | ICD-10-CM | POA: Diagnosis present

## 2015-10-05 DIAGNOSIS — D5720 Sickle-cell/Hb-C disease without crisis: Secondary | ICD-10-CM | POA: Diagnosis not present

## 2015-10-05 DIAGNOSIS — Z8679 Personal history of other diseases of the circulatory system: Secondary | ICD-10-CM | POA: Diagnosis not present

## 2015-10-05 DIAGNOSIS — D649 Anemia, unspecified: Secondary | ICD-10-CM | POA: Diagnosis not present

## 2015-10-05 DIAGNOSIS — Z8673 Personal history of transient ischemic attack (TIA), and cerebral infarction without residual deficits: Secondary | ICD-10-CM | POA: Diagnosis not present

## 2015-10-05 DIAGNOSIS — I482 Chronic atrial fibrillation: Secondary | ICD-10-CM | POA: Diagnosis present

## 2015-10-05 DIAGNOSIS — Z96641 Presence of right artificial hip joint: Secondary | ICD-10-CM | POA: Diagnosis present

## 2015-10-05 LAB — CBC WITH DIFFERENTIAL/PLATELET
BASOS ABS: 0.1 10*3/uL (ref 0.0–0.1)
Basophils Relative: 1 %
EOS PCT: 2 %
Eosinophils Absolute: 0.3 10*3/uL (ref 0.0–0.7)
HEMATOCRIT: 20.9 % — AB (ref 39.0–52.0)
HEMOGLOBIN: 7.2 g/dL — AB (ref 13.0–17.0)
LYMPHS PCT: 14 %
Lymphs Abs: 2 10*3/uL (ref 0.7–4.0)
MCH: 30.9 pg (ref 26.0–34.0)
MCHC: 34.4 g/dL (ref 30.0–36.0)
MCV: 89.7 fL (ref 78.0–100.0)
MONOS PCT: 13 %
Monocytes Absolute: 1.9 10*3/uL — ABNORMAL HIGH (ref 0.1–1.0)
NEUTROS PCT: 70 %
Neutro Abs: 10.2 10*3/uL — ABNORMAL HIGH (ref 1.7–7.7)
Platelets: 515 10*3/uL — ABNORMAL HIGH (ref 150–400)
RBC: 2.33 MIL/uL — AB (ref 4.22–5.81)
RDW: 20.8 % — ABNORMAL HIGH (ref 11.5–15.5)
WBC: 14.5 10*3/uL — AB (ref 4.0–10.5)

## 2015-10-05 LAB — COMPREHENSIVE METABOLIC PANEL
ALT: 15 U/L — ABNORMAL LOW (ref 17–63)
ANION GAP: 5 (ref 5–15)
AST: 24 U/L (ref 15–41)
Albumin: 4.1 g/dL (ref 3.5–5.0)
Alkaline Phosphatase: 75 U/L (ref 38–126)
BILIRUBIN TOTAL: 3.7 mg/dL — AB (ref 0.3–1.2)
BUN: 13 mg/dL (ref 6–20)
CHLORIDE: 114 mmol/L — AB (ref 101–111)
CO2: 21 mmol/L — ABNORMAL LOW (ref 22–32)
Calcium: 8.8 mg/dL — ABNORMAL LOW (ref 8.9–10.3)
Creatinine, Ser: 0.82 mg/dL (ref 0.61–1.24)
GFR calc non Af Amer: >60 mL/min (ref >60)
Glucose, Bld: 109 mg/dL — ABNORMAL HIGH (ref 65–99)
POTASSIUM: 4 mmol/L (ref 3.5–5.1)
Sodium: 140 mmol/L (ref 135–145)
TOTAL PROTEIN: 7.4 g/dL (ref 6.5–8.1)

## 2015-10-05 LAB — RETICULOCYTES
RBC.: 2.33 MIL/uL — AB (ref 4.22–5.81)
RETIC COUNT ABSOLUTE: 146.8 10*3/uL (ref 19.0–186.0)
RETIC CT PCT: 6.3 % — AB (ref 0.4–3.1)

## 2015-10-05 MED ORDER — VITAMIN D 1000 UNITS PO TABS
2000.0000 [IU] | ORAL_TABLET | Freq: Every day | ORAL | Status: DC
  Administered 2015-10-05 – 2015-10-06 (×2): 2000 [IU] via ORAL
  Filled 2015-10-05 (×2): qty 2

## 2015-10-05 MED ORDER — LISINOPRIL 10 MG PO TABS
5.0000 mg | ORAL_TABLET | Freq: Every day | ORAL | Status: DC
  Administered 2015-10-05 – 2015-10-06 (×2): 5 mg via ORAL
  Filled 2015-10-05 (×2): qty 1

## 2015-10-05 MED ORDER — HEPARIN SOD (PORK) LOCK FLUSH 100 UNIT/ML IV SOLN
500.0000 [IU] | Freq: Once | INTRAVENOUS | Status: AC
  Administered 2015-10-05: 500 [IU]
  Filled 2015-10-05: qty 5

## 2015-10-05 MED ORDER — DIPHENHYDRAMINE HCL 25 MG PO CAPS
25.0000 mg | ORAL_CAPSULE | ORAL | Status: DC | PRN
  Administered 2015-10-05: 25 mg via ORAL
  Administered 2015-10-05: 50 mg via ORAL
  Filled 2015-10-05: qty 2
  Filled 2015-10-05: qty 1

## 2015-10-05 MED ORDER — HYDROMORPHONE HCL 2 MG/ML IJ SOLN
0.0313 mg/kg | INTRAMUSCULAR | Status: AC
  Administered 2015-10-05: 2.4 mg via INTRAVENOUS
  Filled 2015-10-05: qty 2

## 2015-10-05 MED ORDER — HYDROMORPHONE HCL 2 MG PO TABS
4.0000 mg | ORAL_TABLET | ORAL | Status: DC
  Administered 2015-10-05 – 2015-10-06 (×4): 4 mg via ORAL
  Filled 2015-10-05 (×4): qty 2

## 2015-10-05 MED ORDER — KETOROLAC TROMETHAMINE 30 MG/ML IJ SOLN
30.0000 mg | Freq: Four times a day (QID) | INTRAMUSCULAR | Status: DC
  Administered 2015-10-05 – 2015-10-06 (×4): 30 mg via INTRAVENOUS
  Filled 2015-10-05 (×4): qty 1

## 2015-10-05 MED ORDER — HYDROMORPHONE 1 MG/ML IV SOLN
INTRAVENOUS | Status: DC
  Administered 2015-10-05: 1 mL via INTRAVENOUS
  Administered 2015-10-05: 6 mg via INTRAVENOUS
  Administered 2015-10-06: 3 mg via INTRAVENOUS
  Administered 2015-10-06: 0.6 mg via INTRAVENOUS
  Administered 2015-10-06: 4.2 mg via INTRAVENOUS
  Filled 2015-10-05: qty 25

## 2015-10-05 MED ORDER — HYDROMORPHONE HCL 2 MG/ML IJ SOLN: 0.0250 mg/kg | INTRAMUSCULAR | Status: AC

## 2015-10-05 MED ORDER — FOLIC ACID 1 MG PO TABS
1.0000 mg | ORAL_TABLET | Freq: Every morning | ORAL | Status: DC
  Administered 2015-10-05 – 2015-10-06 (×2): 1 mg via ORAL
  Filled 2015-10-05 (×2): qty 1

## 2015-10-05 MED ORDER — ONDANSETRON HCL 4 MG/2ML IJ SOLN
4.0000 mg | INTRAMUSCULAR | Status: DC | PRN
  Administered 2015-10-05: 4 mg via INTRAVENOUS
  Filled 2015-10-05: qty 2

## 2015-10-05 MED ORDER — DEXTROSE 5 % IV BOLUS
1000.0000 mL | Freq: Once | INTRAVENOUS | Status: AC
  Administered 2015-10-05: 1000 mL via INTRAVENOUS

## 2015-10-05 MED ORDER — NALOXONE HCL 0.4 MG/ML IJ SOLN: 0.4000 mg | INTRAMUSCULAR | Status: DC | PRN

## 2015-10-05 MED ORDER — ASPIRIN 81 MG PO CHEW
81.0000 mg | CHEWABLE_TABLET | Freq: Every day | ORAL | Status: DC
Start: 2015-10-05 — End: 2015-10-06
  Administered 2015-10-05 – 2015-10-06 (×2): 81 mg via ORAL
  Filled 2015-10-05 (×2): qty 1

## 2015-10-05 MED ORDER — ONDANSETRON HCL 4 MG/2ML IJ SOLN: 4.0000 mg | Freq: Four times a day (QID) | INTRAMUSCULAR | Status: DC | PRN

## 2015-10-05 MED ORDER — HYDROMORPHONE HCL 2 MG/ML IJ SOLN: 0.0375 mg/kg | INTRAMUSCULAR | Status: AC

## 2015-10-05 MED ORDER — HYDROMORPHONE HCL 2 MG/ML IJ SOLN
2.0000 mg | Freq: Once | INTRAMUSCULAR | Status: AC
  Administered 2015-10-05: 2 mg via INTRAVENOUS
  Filled 2015-10-05: qty 1

## 2015-10-05 MED ORDER — SENNOSIDES-DOCUSATE SODIUM 8.6-50 MG PO TABS
1.0000 | ORAL_TABLET | Freq: Two times a day (BID) | ORAL | Status: DC
  Administered 2015-10-05 – 2015-10-06 (×3): 1 via ORAL
  Filled 2015-10-05 (×3): qty 1

## 2015-10-05 MED ORDER — MORPHINE SULFATE ER 30 MG PO TBCR
30.0000 mg | EXTENDED_RELEASE_TABLET | Freq: Two times a day (BID) | ORAL | Status: DC
  Administered 2015-10-05 – 2015-10-06 (×3): 30 mg via ORAL
  Filled 2015-10-05 (×3): qty 1

## 2015-10-05 MED ORDER — TORSEMIDE 20 MG PO TABS
20.0000 mg | ORAL_TABLET | Freq: Every day | ORAL | Status: DC | PRN
Start: 2015-10-05 — End: 2015-10-06
  Filled 2015-10-05: qty 1

## 2015-10-05 MED ORDER — DIPHENHYDRAMINE HCL 25 MG PO CAPS: 25.0000 mg | ORAL_CAPSULE | ORAL | Status: DC | PRN

## 2015-10-05 MED ORDER — SODIUM CHLORIDE 0.9% FLUSH: 9.0000 mL | INTRAVENOUS | Status: DC | PRN

## 2015-10-05 MED ORDER — HYDROMORPHONE HCL 2 MG/ML IJ SOLN
0.0375 mg/kg | INTRAMUSCULAR | Status: AC
  Administered 2015-10-05: 2.9 mg via INTRAVENOUS
  Filled 2015-10-05: qty 2

## 2015-10-05 MED ORDER — ZOLPIDEM TARTRATE 10 MG PO TABS
10.0000 mg | ORAL_TABLET | Freq: Every evening | ORAL | Status: DC | PRN
  Administered 2015-10-05: 10 mg via ORAL
  Filled 2015-10-05: qty 1

## 2015-10-05 MED ORDER — DIPHENHYDRAMINE HCL 50 MG/ML IJ SOLN
25.0000 mg | Freq: Once | INTRAMUSCULAR | Status: AC
  Administered 2015-10-05: 25 mg via INTRAVENOUS
  Filled 2015-10-05: qty 1

## 2015-10-05 MED ORDER — HYDROMORPHONE HCL 2 MG/ML IJ SOLN
2.0000 mg | INTRAMUSCULAR | Status: DC | PRN
  Administered 2015-10-05 – 2015-10-06 (×7): 2 mg via INTRAVENOUS
  Filled 2015-10-05 (×7): qty 1

## 2015-10-05 MED ORDER — HYDROMORPHONE HCL 2 MG/ML IJ SOLN
0.0375 mg/kg | INTRAMUSCULAR | Status: DC
  Administered 2015-10-05: 2.9 mg via INTRAVENOUS
  Filled 2015-10-05: qty 2

## 2015-10-05 MED ORDER — AMBRISENTAN 5 MG PO TABS: 5.0000 mg | ORAL_TABLET | Freq: Every day | ORAL | Status: DC

## 2015-10-05 MED ORDER — KETOROLAC TROMETHAMINE 30 MG/ML IJ SOLN
30.0000 mg | INTRAMUSCULAR | Status: AC
  Administered 2015-10-05: 30 mg via INTRAVENOUS
  Filled 2015-10-05: qty 1

## 2015-10-05 MED ORDER — POLYETHYLENE GLYCOL 3350 17 G PO PACK
17.0000 g | PACK | Freq: Every day | ORAL | Status: DC | PRN
Start: 2015-10-05 — End: 2015-10-06

## 2015-10-05 MED ORDER — RIVAROXABAN 20 MG PO TABS
20.0000 mg | ORAL_TABLET | Freq: Every day | ORAL | Status: DC
  Administered 2015-10-05: 20 mg via ORAL
  Filled 2015-10-05: qty 1

## 2015-10-05 MED ORDER — DEFERASIROX 360 MG PO TABS: 1080.0000 mg | ORAL_TABLET | Freq: Every day | ORAL | Status: DC

## 2015-10-05 MED ORDER — HYDROMORPHONE HCL 2 MG/ML IJ SOLN: 0.0375 mg/kg | INTRAMUSCULAR | Status: DC

## 2015-10-05 MED ORDER — DEXTROSE-NACL 5-0.45 % IV SOLN
INTRAVENOUS | Status: DC
  Administered 2015-10-05 (×2): via INTRAVENOUS

## 2015-10-05 MED ORDER — HYDROMORPHONE HCL 2 MG/ML IJ SOLN: 2.0000 mg | INTRAMUSCULAR | Status: DC

## 2015-10-05 MED ORDER — HYDROMORPHONE HCL 2 MG/ML IJ SOLN
0.0250 mg/kg | INTRAMUSCULAR | Status: AC
  Administered 2015-10-05: 1.9 mg via INTRAVENOUS
  Filled 2015-10-05: qty 1

## 2015-10-05 MED ORDER — HYDROMORPHONE HCL 2 MG/ML IJ SOLN: 0.0313 mg/kg | INTRAMUSCULAR | Status: AC

## 2015-10-05 MED ORDER — GABAPENTIN 300 MG PO CAPS
300.0000 mg | ORAL_CAPSULE | Freq: Three times a day (TID) | ORAL | Status: DC
  Administered 2015-10-05 – 2015-10-06 (×3): 300 mg via ORAL
  Filled 2015-10-05 (×3): qty 1

## 2015-10-05 MED ORDER — SODIUM CHLORIDE 0.9 % IV SOLN: 25.0000 mg | INTRAVENOUS | Status: DC | PRN

## 2015-10-05 MED ORDER — SODIUM CHLORIDE 0.9 % IV BOLUS (SEPSIS)
1000.0000 mL | Freq: Once | INTRAVENOUS | Status: AC
  Administered 2015-10-05: 1000 mL via INTRAVENOUS

## 2015-10-05 NOTE — ED Provider Notes (Signed)
[facility] DEPT Provider Note   CSN: [id number] Arrival date & time: 10/05/15  0024  By signing my name below, I, [name], attest that this documentation has been prepared under the direction and in the presence of Shary Decamp, PA-C.  Electronically Signed: Estanislado [name], Scribe. 10/05/2015. 12:51 AM.    History   Chief Complaint Chief Complaint  Patient presents with  . Sickle Cell Pain Crisis    The history is provided by the patient. No language interpreter was used.   HPI Comments:  [name] is a 35 y.o. male with PMHx of PE, HTN and sickle cell anemia who presents to the Emergency Department complaining of sudden onset, constant back pain and flank pain related to his sickle cell crisis beginning last night. Pt describes the pain as 8/10. Pt complains of associated nausea. Pt was seen yesterday in the ED for the same pain. Pt notes no known allergies to medication. Pt denies chest pain, vomiting.  [name], MD is pt's PCP and sent pt home last week with no treatment.   Past Medical History:  Diagnosis Date  . Acute chest syndrome ([place]) 06/18/2013  . Acute embolism and thrombosis of right internal jugular vein ([place])   . Alcohol consumption of one to four drinks per day   . Avascular necrosis (HCC)    Right Hip  . Blood transfusion   . Chronic anticoagulation   . Demand ischemia ([place]) 01/02/2014  . Former smoker   . Functional asplenia   . Hb-SS disease with crisis ([place])   . History of Clostridium difficile infection   . History of pulmonary embolus (PE)   . Hypertension   . Hypokalemia   . Leukocytosis    Chronic  . Mood disorder ([place])   . Noncompliance with medication regimen   . Oxygen deficiency   . Pulmonary hypertension ([place])   . Second hand tobacco smoke exposure   . Sickle cell anemia (HCC)   . Sickle-cell crisis with associated acute chest syndrome ([place]) 05/13/2013  . Thrombocytosis (HCC)    Chronic  . Uses marijuana      Patient Active Problem List   Diagnosis Date Noted  . Sickle cell pain crisis ([place]) 09/18/2015  . Sickle cell anemia with crisis ([place]) 09/04/2015  . Shortness of breath   . Pulmonary embolism without acute cor pulmonale ([place])   . Fever   . Sickle cell anemia with pain ([place]) 05/07/2015  . Hypoxia   . Anemia 01/31/2015  . Symptomatic anemia   . Hb-SS disease without crisis ([place]) 10/07/2014  . PVC's (premature ventricular contractions) 09/10/2014  . Abnormal EKG 09/10/2014  . Chronic atrial fibrillation ([place]) 09/09/2014  . Cor pulmonale, chronic ([place]) 08/25/2014  . Sickle cell anemia ([place]) 06/25/2014  . Anemia of chronic disease 06/25/2014  . PAH (pulmonary artery hypertension) ([place]) 03/18/2014  . Hb-SS disease with crisis ([place]) 01/22/2014  . Paralytic strabismus, external ophthalmoplegia   . Chronic pain syndrome 12/12/2013  . Chronic anticoagulation 08/22/2013  . Essential hypertension 08/22/2013  . Pulmonary HTN ([place]) 06/18/2013  . Functional asplenia   . Vitamin D deficiency 02/13/2013  . Hx of pulmonary embolus 06/29/2012  . Hemochromatosis 12/14/2011  . Avascular necrosis [facility])     Past Surgical History:  Procedure Laterality Date  . CHOLECYSTECTOMY     01/2008  . Excision of left periauricular cyst     10/2009  . Excision of right ear lobe cyst with primary closur     11/2007  .  Porta cath placement    . Porta cath removal    . PORTACATH PLACEMENT  01/05/2012   Procedure: INSERTION PORT-A-CATH;  Surgeon: Odis Hollingshead, MD;  Location: [facility];  Service: General;  Laterality: N/A;  ultrasound guiced port a cath insertion with fluoroscopy  . Right hip replacement     08/2006  . UMBILICAL HERNIA REPAIR     01/2008       Home Medications    Prior to Admission medications   Medication Sig Start Date End Date Taking? Authorizing Provider  ambrisentan (LETAIRIS) 5 MG tablet Take 5 mg by mouth at bedtime.     Historical Provider, MD  aspirin 81 MG chewable  tablet Chew 1 tablet (81 mg total) by mouth daily. 07/23/14   [name], MD  Cholecalciferol (VITAMIN D) 2000 units tablet Take 1 tablet (2,000 Units total) by mouth daily. 02/23/15   Dorena Dew, FNP  Deferasirox (JADENU) 360 MG TABS Take 1,080 mg by mouth daily.     Historical Provider, MD  folic acid (FOLVITE) 1 MG tablet Take 1 tablet (1 mg total) by mouth every morning. 02/23/15   Dorena Dew, FNP  gabapentin (NEURONTIN) 300 MG capsule Take 1 capsule (300 mg total) by mouth 3 (three) times daily. 06/01/15   Tresa Garter, MD  HYDROmorphone (DILAUDID) 4 MG tablet Take 1 tablet (4 mg total) by mouth every 4 (four) hours as needed for severe pain. 09/26/15   Shary Decamp, PA-C  lisinopril (PRINIVIL,ZESTRIL) 5 MG tablet Take 1 tablet (5 mg total) by mouth daily. 08/04/15   [name], MD  morphine (MSIR) 30 MG tablet Take 1 tablet (30 mg total) by mouth every 12 (twelve) hours. 09/26/15   Shary Decamp, PA-C  potassium chloride SA (K-DUR,KLOR-CON) 20 MEQ tablet Take 1 tablet (20 mEq total) by mouth every morning. Patient not taking: Reported on 09/28/2015 04/13/15   Dorena Dew, FNP  rivaroxaban (XARELTO) 20 MG TABS tablet Take 20 mg by mouth daily with supper.    Historical Provider, MD  torsemide (DEMADEX) 20 MG tablet Take 20 mg by mouth daily as needed. For increase in patient weight greater than 3 lbs. 07/27/15 07/26/16  Historical Provider, MD  zolpidem (AMBIEN) 10 MG tablet Take 1 tablet (10 mg total) by mouth at bedtime as needed for sleep. 09/07/15   Tresa Garter, MD    Family History Family History  Problem Relation Age of Onset  . Sickle cell trait Mother   . Depression Mother   . Diabetes Mother   . Sickle cell trait Father   . Sickle cell trait Brother     Social History Social History  Substance Use Topics  . Smoking status: Former Smoker    Packs/day: 0.50    Years: 10.00    Types: Cigarettes    Quit date: 05/29/2011  . Smokeless tobacco:  Never Used  . Alcohol use No     Allergies   Review of patient's allergies indicates no known allergies.   Review of Systems Review of Systems 10 Systems reviewed and are negative for acute change except as noted in the HPI.   Physical Exam Updated Vital Signs BP 123/77 (BP Location: Right Arm)   Pulse 85   Temp 99 F (37.2 C) (Oral)   Resp 18   SpO2 100%   Physical Exam  Constitutional: Vital signs are normal. He appears well-developed and well-nourished. No distress.  Pt in NAD. Texting on Phone.  HENT:  Head: Normocephalic and atraumatic.  Eyes: Conjunctivae and EOM are normal. Pupils are equal, round, and reactive to light.  Neck: Trachea normal, normal range of motion, full passive range of motion without pain and phonation normal. Neck supple.  Cardiovascular: Normal rate, regular rhythm, normal heart sounds and intact distal pulses.   Pulmonary/Chest: Effort normal and breath sounds normal. No respiratory distress. He has no wheezes. He has no rales.  Lungs CTA  Abdominal: Soft. He exhibits no distension.  Musculoskeletal: Normal range of motion.  Compartments soft and without TTP x4  Neurological: He is alert.  Skin: Skin is warm and dry.  Psychiatric: He has a normal mood and affect.  Nursing note and vitals reviewed.  ED Treatments / Results  DIAGNOSTIC STUDIES:  Oxygen Saturation is 100% on RA, normal by my interpretation.    COORDINATION OF CARE:  12:51 AM Discussed treatment plan with pt at bedside and pt agreed to plan.  Labs (all labs ordered are listed, but only abnormal results are displayed) Labs Reviewed  COMPREHENSIVE METABOLIC PANEL - Abnormal; Notable for the following:       Result Value   Chloride 114 (*)    CO2 21 (*)    Glucose, Bld 109 (*)    Calcium 8.8 (*)    ALT 15 (*)    Total Bilirubin 3.7 (*)    All other components within normal limits  CBC WITH DIFFERENTIAL/PLATELET - Abnormal; Notable for the following:    WBC 14.5 (*)     RBC 2.33 (*)    Hemoglobin 7.2 (*)    HCT 20.9 (*)    RDW 20.8 (*)    Platelets 515 (*)    Neutro Abs 10.2 (*)    Monocytes Absolute 1.9 (*)    All other components within normal limits  RETICULOCYTES - Abnormal; Notable for the following:    Retic Ct Pct 6.3 (*)    RBC. 2.33 (*)    All other components within normal limits    EKG  EKG Interpretation None      Radiology No results found.  Procedures Procedures (including critical care time)  Medications Ordered in ED Medications - No data to display   Initial Impression / Assessment and Plan / ED Course  I have reviewed the triage vital signs and the nursing notes.  Pertinent labs & imaging results that were available during my care of the patient were reviewed by me and considered in my medical decision making (see chart for details).  Clinical Course   Final Clinical Impressions(s) / ED Diagnoses  I have reviewed and evaluated the relevant laboratory values  I have reviewed the relevant previous healthcare records. I obtained HPI from historian. Patient discussed with supervising physician  ED Course:  Assessment: Pt is a 35yM with hx Sickle Cell Disease who presents with right flank pain typical of sickle cell pain. No CP/SOB. Seen multiple times in ED for same. Home medications without relief. Seen by PCP recently. On exam, pt in NAD. Nontoxic/nonseptic appearing. VSS. Afebrile. Lungs CTA. Heart RRR. Abdomen nontender soft. Pt texting on phone during examination and in NAD. Pt seen last night for same. Labs unreamrakble. Given dilaudid x3, Toradol, benadryl, fluids, zofran in ED. Pt sleeping after dosing and appeared comfortable until provider walked in the room. Patient has been appropriately medically screened and emergency conditions excluded. Plan is to [place] with follow up to PCP. Pt stable for discharge. At time of discharge, Patient is in  no acute distress. Vital Signs are stable. Patient is able to  ambulate. Patient able to tolerate PO.    Disposition/Plan:  DC Home Additional Verbal discharge instructions given and discussed with patient.  Pt Instructed to f/u with PCP in the next week for evaluation and treatment of symptoms. Return precautions given Pt acknowledges and agrees with plan  Supervising Physician April Palumbo, MD   Final diagnoses:  Sickle cell pain crisis [facility])    New Prescriptions New Prescriptions   No medications on file    I personally performed the services described in this documentation, which was scribed in my presence. The recorded information has been reviewed and is accurate.    Shary Decamp, PA-C 10/05/15 [id number]    April Palumbo, MD 10/05/15 0330

## 2015-10-05 NOTE — ED Triage Notes (Signed)
Pt reports bilateral ribs pain x 2 days. Denies chest pain nor shortness of breath . Alert and oriented x 4. Hx sickle cell.

## 2015-10-05 NOTE — ED Triage Notes (Signed)
Pt states that he has had back pain since last night related to his sickle cell crisis. Alert and oriented.

## 2015-10-05 NOTE — ED Notes (Signed)
RN attempted to call report to floor RN Baxter Flattery.  Baxter Flattery states that she is fine to read chart and discussing Pt with admitting RN whe is transporting Pt to the floor for this RN.

## 2015-10-05 NOTE — H&P (Signed)
Hospital Admission Note Date: 10/05/2015  Patient name: [name] Medical record number: [id number] Date of birth: Jun 05, 1980 Age: 35 y.o. 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 The patient states that he's been having increased pain in his ribs and back 1 week. He attempted to manage his pain but his oral medications however he got to the point was ineffective and thus he came to the emergency room seeking further medical attention. The patient is scheduled to have his exchange transfusion sometime in the next week and reports that he really just once to try to get the pain down to manageable level so that he can get his exchange transfusion which always improves his pain and his overall medical condition after having received it. He denies any fever, chills, nausea, vomiting, diarrhea, focal neurological deficits or headaches.  In the emergency room he was found to be hypoxic with saturation of 90% on room air. He received Dilaudid IV push 3 and Toradol. In addition he received IV fluids and the pain is still on manageable with oral medications. I'm asked to admit the patient for management of acute sickle cell pain crisis.  Scheduled Meds: . ambrisentan  5 mg Oral QHS  . aspirin  81 mg Oral Daily  . cholecalciferol  2,000 Units Oral Daily  . [START ON 10/06/2015] Deferasirox  1,080 mg Oral Daily  . folic acid  1 mg Oral q morning - 10a  . gabapentin  300 mg Oral TID  . HYDROmorphone   Intravenous Q4H  .  HYDROmorphone (DILAUDID) injection  2 mg Intravenous Q2H  . ketorolac  30 mg Intravenous Q6H  . lisinopril  5 mg Oral Daily  . morphine  30 mg Oral Q12H  . rivaroxaban  20 mg Oral Daily  . senna-docusate  1 tablet Oral BID   Continuous  Infusions: . dextrose 5 % and 0.45% NaCl 100 mL/hr at 10/05/15 1438   PRN Meds:.diphenhydrAMINE **OR** diphenhydrAMINE (BENADRYL) IVPB(SICKLE CELL ONLY), diphenhydrAMINE, naloxone **AND** sodium chloride flush, ondansetron (ZOFRAN) IV, polyethylene glycol, torsemide, zolpidem Allergies: Review of patient's allergies indicates no known allergies. Past Medical History:  Diagnosis Date  . Acute chest syndrome ([name]) 06/18/2013  . Acute embolism and thrombosis of right internal jugular vein ([place])   . Alcohol consumption of one to four drinks per day   . Avascular necrosis (HCC)    Right Hip  . Blood transfusion   . Chronic anticoagulation   . Demand ischemia ([place]) 01/02/2014  . Former smoker   . Functional asplenia   . Hb-SS disease with crisis ([place])   . History of Clostridium difficile infection   . History of pulmonary embolus (PE)   . Hypertension   . Hypokalemia   . Leukocytosis    Chronic  . Mood disorder ([place])   . Noncompliance with medication regimen   . Oxygen deficiency   . Pulmonary hypertension ([place])   . Second hand tobacco smoke exposure   . Sickle cell anemia (HCC)   . Sickle-cell crisis with associated acute chest syndrome ([place]) 05/13/2013  . Thrombocytosis (HCC)    Chronic  . Uses marijuana    Past Surgical History:  Procedure Laterality Date  . CHOLECYSTECTOMY     01/2008  . Excision of left periauricular  cyst     10/2009  . Excision of right ear lobe cyst with primary closur     11/2007  . Porta cath placement    . Porta cath removal    . PORTACATH PLACEMENT  01/05/2012   Procedure: INSERTION PORT-A-CATH;  Surgeon: Odis Hollingshead, MD;  Location: [facility];  Service: General;  Laterality: N/A;  ultrasound guiced port a cath insertion with fluoroscopy  . Right hip replacement     08/2006  . UMBILICAL HERNIA REPAIR     01/2008   Family History  Problem Relation Age of Onset  . Sickle cell trait Mother   . Depression Mother   . Diabetes Mother   . Sickle  cell trait Father   . Sickle cell trait Brother    Social History   Social History  . Marital status: Single    Spouse name: N/A  . Number of children: 0  . Years of education: 63   Occupational History  . 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 Smoking status: Former Smoker    Packs/day: 0.50    Years: 10.00    Types: Cigarettes    Quit date: 05/29/2011  . Smokeless tobacco: Never Used  . Alcohol use No  . Drug use: No     Comment: Marijuana weekly  . Sexual activity: Yes    Partners: Female     Comment: month ago   Other Topics Concern  . Not on file   Social History Narrative   Lives in an apartment.  Single.  Lives alone but has a girlfriend that helps care for him.  Does not use any assist devices.        Gerald [id number]  [phone] Mom, emergency contact      [name] Pulmonary:   Patient continuing to live in her apartment in town alone. 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   Review of Systems: Pertinent items noted in HPI and remainder of comprehensive ROS otherwise negative. Physical Exam:  Intake/Output Summary (Last 24 hours) at 10/05/15 1636 Last data filed at 10/05/15 1500  Gross per 24 hour  Intake          1036.67 ml  Output                0 ml  Net          1036.67 ml   General: Alert, awake, oriented x3, in no acute distress.  HEENT: [place]/AT PEERL, EOMI Neck: Trachea midline,  no masses, no thyromegal,y no JVD, no carotid bruit OROPHARYNX:  Moist, No exudate/ erythema/lesions.  Heart: Regular rate and rhythm, without murmurs, rubs, gallops, PMI non-displaced, no heaves or thrills on palpation.  Lungs: Clear to auscultation, no wheezing or rhonchi noted. No increased vocal fremitus resonant to percussion  Abdomen: Soft, nontender, nondistended, positive bowel sounds, no masses no hepatosplenomegaly noted..  Neuro: No focal neurological deficits noted cranial nerves II through XII  grossly intact. DTRs 2+ bilaterally upper and lower extremities. Strength 5 out of 5 in bilateral upper and lower extremities. Musculoskeletal: No warm swelling or erythema around joints, no spinal tenderness noted. Psychiatric: Patient alert and oriented x3, good insight and cognition, good recent to remote recall. Lymph node survey: No cervical axillary or inguinal lymphadenopathy noted.  Lab results:  Recent Labs  10/04/15 0504 10/05/15 0110  NA 144 140  K 3.8 4.0  CL 107  114*  CO2  --  21*  GLUCOSE 105* 109*  BUN 14 13  CREATININE 1.20 0.82  CALCIUM  --  8.8*    Recent Labs  10/05/15 0110  AST 24  ALT 15*  ALKPHOS 75  BILITOT 3.7*  PROT 7.4  ALBUMIN 4.1   No results for input(s): LIPASE, AMYLASE in the last 72 hours.  Recent Labs  10/04/15 0444 10/04/15 0504 10/05/15 0110  WBC 13.9*  --  14.5*  NEUTROABS 10.2*  --  10.2*  HGB 8.1* 7.8* 7.2*  HCT 23.5* 23.0* 20.9*  MCV 91.1  --  89.7  PLT 478*  --  515*   No results for input(s): CKTOTAL, CKMB, CKMBINDEX, TROPONINI in the last 72 hours. Invalid input(s): POCBNP No results for input(s): DDIMER in the last 72 hours. No results for input(s): HGBA1C in the last 72 hours. No results for input(s): CHOL, HDL, LDLCALC, TRIG, CHOLHDL, LDLDIRECT in the last 72 hours. No results for input(s): TSH, T4TOTAL, T3FREE, THYROIDAB in the last 72 hours.  Invalid input(s): FREET3  Recent Labs  10/04/15 0444 10/05/15 0110  RETICCTPCT 5.2* 6.3*   Imaging results:  Dg Chest 2 View  Result Date: 09/26/2015 CLINICAL DATA:  Bilateral rib pain radiating to back for 2 days. History of hypertension and sickle cell. EXAM: CHEST  2 VIEW COMPARISON:  Chest radiograph September 23, 2015 FINDINGS: The cardiac silhouette is moderately enlarged and unchanged. Mediastinal silhouette is nonsuspicious. Stable mild bibasilar strandy densities, unchanged. No pleural effusion or focal consolidation. No pneumothorax. Single lumen LEFT chest  Port-A-Cath with distal tip projecting cavoatrial junction. No pneumothorax. Soft tissue planes included osseous structures are nonsuspicious. Surgical clips in the included right abdomen compatible with cholecystectomy. IMPRESSION: Stable cardiomegaly.  No acute pulmonary process. Electronically Signed   By: Elon Alas M.D.   On: 09/26/2015 04:21   Dg Chest 2 View  Result Date: 09/23/2015 CLINICAL DATA:  Fever today EXAM: CHEST  2 VIEW COMPARISON:  09/18/2015 FINDINGS: Left chest wall port is again seen and stable. Cardiac shadow remains enlarged. The lungs are well aerated bilaterally. Diffuse interstitial changes are again seen stable from the prior study. No focal infiltrate is seen. IMPRESSION: No acute abnormality noted. Electronically Signed   By: Inez Catalina M.D.   On: 09/23/2015 13:04   Dg Chest 2 View  Result Date: 09/18/2015 CLINICAL DATA:  Status post fall, with upper anterior chest pain. Initial encounter. EXAM: CHEST  2 VIEW COMPARISON:  Chest radiograph performed 09/01/2015 FINDINGS: The lungs are well-aerated. Mild vascular congestion is noted, with mild bibasilar atelectasis. There is no evidence of pleural effusion or pneumothorax. The heart is enlarged. A left-sided chest port ends about the cavoatrial junction. No acute osseous abnormalities are seen. IMPRESSION: Mild vascular congestion and cardiomegaly noted. Mild bibasilar atelectasis seen. No displaced rib fractures identified. Electronically Signed   By: Garald Balding M.D.   On: 09/18/2015 02:17   Dg Lumbar Spine Complete  Result Date: 09/18/2015 CLINICAL DATA:  Status post fall, with lower back pain radiating to both hips. Initial encounter. EXAM: LUMBAR SPINE - COMPLETE 4+ VIEW COMPARISON:  CT of the abdomen and pelvis from 06/03/2015 FINDINGS: There is no evidence of fracture or subluxation. Vertebral bodies demonstrate normal height and alignment. Intervertebral disc spaces are preserved. The visualized neural  foramina are grossly unremarkable in appearance. A right hip arthroplasty is only partially imaged but appears grossly unremarkable. The visualized bowel gas pattern is unremarkable in appearance; air and stool  are noted within the colon. The sacroiliac joints are within normal limits. Clips are noted within the right upper quadrant, reflecting prior cholecystectomy. IMPRESSION: No evidence of fracture or subluxation along the lumbar spine. Electronically Signed   By: Garald Balding M.D.   On: 09/18/2015 02:19   Dg Hip Unilat W Or Wo Pelvis 2-3 Views Right  Result Date: 09/18/2015 CLINICAL DATA:  Status post fall, landing on right side, with right hip pain. Initial encounter. EXAM: DG HIP (WITH OR WITHOUT PELVIS) 2-3V RIGHT COMPARISON:  None. FINDINGS: There is no evidence of fracture or dislocation. The patient's right hip arthroplasty is grossly unremarkable in appearance, without evidence of loosening. Underlying screws are noted along the right acetabulum. Both femoral heads are seated normally within their respective acetabula. The proximal right femur appears intact. No significant degenerative change is appreciated. The sacroiliac joints are unremarkable in appearance. The visualized bowel gas pattern is grossly unremarkable in appearance. IMPRESSION: No evidence of fracture or dislocation. Right hip arthroplasty is grossly intact, without evidence of loosening. Electronically Signed   By: Garald Balding M.D.   On: 09/18/2015 02:16     Assessment and plan:  HBSS with crisis: The patient presents with acute sickle cell crisis. My suspicion is that his hemoglobin as has increased since his last red cell pheresis and the change in weather has triggered crisis. The goal of this management is to get the patient wears pain medications are manageable even minimally on oral analgesics so that he can get to his red cell pheresis which will likely cause of definitive decrease in his pain as a percentage of  sickle cell severe reduced. The patient is unsure as to what today his red cell pheresis will occur. He will check with his significant other and we will plan his hospital course accordingly. I will prescribed a Dilaudid PCA, Toradol and IV fluids.  Hypoxemia: The patient has some decreased oxygen saturations which likely may be related to his depth of breathing in a setting of pain in the ribs as well as the result of having received IV narcotic medication. Physical examination does not exhibit any acute abnormalities and his history is not suggestive of any infectious process. I will encourage incentive spirometry use and his supplemental oxygen as necessary to maintain saturations above 94%.  Anemia of chronic disease: The patient does have a baseline hemoglobin of 6.5 g/dL. His last red cell pheresis was on 09/07/2015. He will check to see when his next red cell pheresis scheduled and we will attempt to manage his pain and anemia with a goal to get him safely to his red cell pheresis in the next day or so.  Chronic pain syndrome: The patient normally takes MS Contin. 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  Time spent during this visit 50 minutes. Greater than 50% of time spent in face-to-face contact, assessment, counseling and coordination of care.  Doye Montilla A. 10/05/2015, 4:36 PM

## 2015-10-05 NOTE — Discharge Instructions (Signed)
Please read and follow all provided instructions.  Your diagnoses today include:  1. Sickle cell pain crisis ([place])    Tests performed today include: Vital signs. See below for your results today.   Medications prescribed:  Take as prescribed   Home care instructions:  Follow any educational materials contained in this packet.  Follow-up instructions: Please follow-up with your primary care provider for further evaluation of symptoms and treatment   Return instructions:  Please return to the Emergency Department if you do not get better, if you get worse, or new symptoms OR  - Fever (temperature greater than 101.32F)  - Bleeding that does not stop with holding pressure to the area    -Severe pain (please note that you may be more sore the day after your accident)  - Chest Pain  - Difficulty breathing  - Severe nausea or vomiting  - Inability to tolerate food and liquids  - Passing out  - Skin becoming red around your wounds  - Change in mental status (confusion or lethargy)  - New numbness or weakness    Please return if you have any other emergent concerns.  Additional Information:  Your vital signs today were: BP 110/77    Pulse 86    Temp 99 F (37.2 C) (Oral)    Resp 18    Ht 6' (1.829 m)    Wt 75.8 kg    SpO2 91%    BMI 22.65 kg/m  If your blood pressure (BP) was elevated above 135/85 this visit, please have this repeated by your doctor within one month. ---------------

## 2015-10-05 NOTE — ED Provider Notes (Signed)
[facility] DEPT Provider Note   CSN: [id number] Arrival date & time: 10/05/15  0841     History   Chief Complaint Chief Complaint  Patient presents with  . Sickle Cell Pain Crisis    HPI [name] is a 35 y.o. male.  35 yo M with a chief complaint of sickle cell pain. This feels typical of his prior sickle cell events. Having pain mostly in the right chest wall in the low back. This is the patient's third visit in the past 3 days for the same. Feels that his pain is not significant improved. Denies shortness of breath fever chills. Denies any new areas of pain.   The history is provided by the patient.  Sickle Cell Pain Crisis  Location:  Chest and back Severity:  Severe Duration:  3 days Similar to previous crisis episodes: yes   Timing:  Constant Progression:  Unchanged Chronicity:  New Context: cold exposure and low humidity   Context: not infection   Relieved by:  Nothing Worsened by:  Nothing Ineffective treatments:  None tried Associated symptoms: no chest pain, no congestion, no fever, no headaches, no shortness of breath and no vomiting     Past Medical History:  Diagnosis Date  . Acute chest syndrome ([place]) 06/18/2013  . Acute embolism and thrombosis of right internal jugular vein ([place])   . Alcohol consumption of one to four drinks per day   . Avascular necrosis (HCC)    Right Hip  . Blood transfusion   . Chronic anticoagulation   . Demand ischemia ([place]) 01/02/2014  . Former smoker   . Functional asplenia   . Hb-SS disease with crisis ([place])   . History of Clostridium difficile infection   . History of pulmonary embolus (PE)   . Hypertension   . Hypokalemia   . Leukocytosis    Chronic  . Mood disorder ([place])   . Noncompliance with medication regimen   . Oxygen deficiency   . Pulmonary hypertension ([place])   . Second hand tobacco smoke exposure   . Sickle cell anemia (HCC)   . Sickle-cell crisis with associated acute chest syndrome ([place])  05/13/2013  . Thrombocytosis (HCC)    Chronic  . Uses marijuana     Patient Active Problem List   Diagnosis Date Noted  . Sickle cell pain crisis ([place]) 09/18/2015  . Sickle cell anemia with crisis ([place]) 09/04/2015  . Shortness of breath   . Pulmonary embolism without acute cor pulmonale ([place])   . Fever   . Sickle cell anemia with pain ([place]) 05/07/2015  . Hypoxia   . Anemia 01/31/2015  . Symptomatic anemia   . Hb-SS disease without crisis ([place]) 10/07/2014  . PVC's (premature ventricular contractions) 09/10/2014  . Abnormal EKG 09/10/2014  . Chronic atrial fibrillation ([place]) 09/09/2014  . Cor pulmonale, chronic ([place]) 08/25/2014  . Sickle cell anemia ([name]) 06/25/2014  . Anemia of chronic disease 06/25/2014  . PAH (pulmonary artery hypertension) ([place]) 03/18/2014  . Hb-SS disease with crisis ([place]) 01/22/2014  . Paralytic strabismus, external ophthalmoplegia   . Chronic pain syndrome 12/12/2013  . Chronic anticoagulation 08/22/2013  . Essential hypertension 08/22/2013  . Pulmonary HTN ([place]) 06/18/2013  . Functional asplenia   . Vitamin D deficiency 02/13/2013  . Hx of pulmonary embolus 06/29/2012  . Hemochromatosis 12/14/2011  . Avascular necrosis [facility])     Past Surgical History:  Procedure Laterality Date  . CHOLECYSTECTOMY     01/2008  . Excision of left periauricular  cyst     10/2009  . Excision of right ear lobe cyst with primary closur     11/2007  . Porta cath placement    . Porta cath removal    . PORTACATH PLACEMENT  01/05/2012   Procedure: INSERTION PORT-A-CATH;  Surgeon: Odis Hollingshead, MD;  Location: [facility];  Service: General;  Laterality: N/A;  ultrasound guiced port a cath insertion with fluoroscopy  . Right hip replacement     08/2006  . UMBILICAL HERNIA REPAIR     01/2008       Home Medications    Prior to Admission medications   Medication Sig Start Date End Date Taking? Authorizing Provider  ambrisentan (LETAIRIS) 5 MG tablet Take 5 mg by mouth  at bedtime.    Yes Historical Provider, MD  aspirin 81 MG chewable tablet Chew 1 tablet (81 mg total) by mouth daily. 07/23/14  Yes [name], MD  Cholecalciferol (VITAMIN D) 2000 units tablet Take 1 tablet (2,000 Units total) by mouth daily. 02/23/15  Yes Dorena Dew, FNP  Deferasirox (JADENU) 360 MG TABS Take 1,080 mg by mouth daily.    Yes Historical Provider, MD  folic acid (FOLVITE) 1 MG tablet Take 1 tablet (1 mg total) by mouth every morning. 02/23/15  Yes Dorena Dew, FNP  gabapentin (NEURONTIN) 300 MG capsule Take 1 capsule (300 mg total) by mouth 3 (three) times daily. 06/01/15  Yes Tresa Garter, MD  HYDROmorphone (DILAUDID) 4 MG tablet Take 1 tablet (4 mg total) by mouth every 4 (four) hours as needed for severe pain. 09/26/15  Yes Shary Decamp, PA-C  lisinopril (PRINIVIL,ZESTRIL) 5 MG tablet Take 1 tablet (5 mg total) by mouth daily. 08/04/15  Yes [name], MD  morphine (MSIR) 30 MG tablet Take 1 tablet (30 mg total) by mouth every 12 (twelve) hours. 09/26/15  Yes Shary Decamp, PA-C  rivaroxaban (XARELTO) 20 MG TABS tablet Take 20 mg by mouth daily.    Yes Historical Provider, MD  torsemide (DEMADEX) 20 MG tablet Take 20 mg by mouth daily as needed. For increase in patient weight greater than 3 lbs. 07/27/15 07/26/16 Yes Historical Provider, MD  zolpidem (AMBIEN) 10 MG tablet Take 1 tablet (10 mg total) by mouth at bedtime as needed for sleep. 09/07/15  Yes Tresa Garter, MD  potassium chloride SA (K-DUR,KLOR-CON) 20 MEQ tablet Take 1 tablet (20 mEq total) by mouth every morning. Patient not taking: Reported on 09/28/2015 04/13/15   Dorena Dew, FNP    Family History Family History  Problem Relation Age of Onset  . Sickle cell trait Mother   . Depression Mother   . Diabetes Mother   . Sickle cell trait Father   . Sickle cell trait Brother     Social History Social History  Substance Use Topics  . Smoking status: Former Smoker    Packs/day: 0.50      Years: 10.00    Types: Cigarettes    Quit date: 05/29/2011  . Smokeless tobacco: Never Used  . Alcohol use No     Allergies   Review of patient's allergies indicates no known allergies.   Review of Systems Review of Systems  Constitutional: Negative for chills and fever.  HENT: Negative for congestion and facial swelling.   Eyes: Negative for discharge and visual disturbance.  Respiratory: Negative for shortness of breath.   Cardiovascular: Negative for chest pain and palpitations.  Gastrointestinal: Negative for abdominal pain, diarrhea and  vomiting.  Musculoskeletal: Positive for arthralgias and myalgias.  Skin: Negative for color change and rash.  Neurological: Negative for tremors, syncope and headaches.  Psychiatric/Behavioral: Negative for confusion and dysphoric mood.     Physical Exam Updated Vital Signs BP 101/85   Pulse 97   Temp 98.6 F (37 C) (Oral)   Resp 18   Ht 6' (1.829 m)   Wt 170 lb (77.1 kg)   SpO2 93%   BMI 23.06 kg/m   Physical Exam  Constitutional: He is oriented to person, place, and time. He appears well-developed and well-nourished.  HENT:  Head: Normocephalic and atraumatic.  Eyes: Conjunctivae and EOM are normal. Pupils are equal, round, and reactive to light.  Neck: Normal range of motion. No JVD present.  Cardiovascular: Normal rate and regular rhythm.   Pulmonary/Chest: Effort normal. No stridor. No respiratory distress.  Abdominal: He exhibits no distension. There is no tenderness. There is no guarding.  Musculoskeletal: Normal range of motion. He exhibits tenderness (mild to R chest wall, and lower back). He exhibits no edema.  Neurological: He is alert and oriented to person, place, and time.  Skin: Skin is warm and dry.  Psychiatric: He has a normal mood and affect. His behavior is normal.     ED Treatments / Results  Labs (all labs ordered are listed, but only abnormal results are displayed) Labs Reviewed - No data to  display  EKG  EKG Interpretation None       Radiology No results found.  Procedures Procedures (including critical care time)  Medications Ordered in ED Medications  diphenhydrAMINE (BENADRYL) capsule 25-50 mg (50 mg Oral Given 10/05/15 1408)  zolpidem (AMBIEN) tablet 10 mg (not administered)  ambrisentan (LETAIRIS) tablet 5 mg (not administered)  lisinopril (PRINIVIL,ZESTRIL) tablet 5 mg (not administered)  rivaroxaban (XARELTO) tablet 20 mg (not administered)  torsemide (DEMADEX) tablet 20 mg (not administered)  Deferasirox TABS 1,080 mg (not administered)  gabapentin (NEURONTIN) capsule 300 mg (300 mg Oral Given 10/05/15 1413)  cholecalciferol (VITAMIN D) tablet 2,000 Units (2,000 Units Oral Given [id number])  folic acid (FOLVITE) tablet 1 mg (1 mg Oral Given 10/05/15 1414)  aspirin chewable tablet 81 mg (81 mg Oral Given 10/05/15 1414)  senna-docusate (Senokot-S) tablet 1 tablet (not administered)  polyethylene glycol (MIRALAX / GLYCOLAX) packet 17 g (not administered)  dextrose 5 %-0.45 % sodium chloride infusion ( Intravenous New Bag/Given 10/05/15 1438)  ketorolac (TORADOL) 30 MG/ML injection 30 mg (not administered)  naloxone (NARCAN) injection 0.4 mg (not administered)    And  sodium chloride flush (NS) 0.9 % injection 9 mL (not administered)  ondansetron (ZOFRAN) injection 4 mg (not administered)  diphenhydrAMINE (BENADRYL) capsule 25-50 mg (not administered)    Or  diphenhydrAMINE (BENADRYL) 25 mg in sodium chloride 0.9 % 50 mL IVPB (not administered)  HYDROmorphone (DILAUDID) 1 mg/mL PCA injection (1 mL Intravenous Set-up / Initial Syringe 10/05/15 1429)  morphine (MS CONTIN) 12 hr tablet 30 mg (not administered)  dextrose 5 % bolus 1,000 mL (0 mLs Intravenous Stopped 10/05/15 1137)  ketorolac (TORADOL) 30 MG/ML injection 30 mg (30 mg Intravenous Given 10/05/15 0938)  HYDROmorphone (DILAUDID) injection 1.9 mg (1.9 mg Intravenous Given 10/05/15 0938)    Or  HYDROmorphone  (DILAUDID) injection 1.9 mg ( Subcutaneous See Alternative 10/05/15 0938)  HYDROmorphone (DILAUDID) injection 2.4 mg (2.4 mg Intravenous Given 10/05/15 1013)    Or  HYDROmorphone (DILAUDID) injection 2.4 mg ( Subcutaneous See Alternative 10/05/15 1013)  HYDROmorphone (DILAUDID) injection 2.9  mg (2.9 mg Intravenous Given 10/05/15 1048)    Or  HYDROmorphone (DILAUDID) injection 2.9 mg ( Subcutaneous See Alternative 10/05/15 1048)     Initial Impression / Assessment and Plan / ED Course  I have reviewed the triage vital signs and the nursing notes.  Pertinent labs & imaging results that were available during my care of the patient were reviewed by me and considered in my medical decision making (see chart for details).  Clinical Course    35 yo M With a chief complaint of sickle cell pain crisis. This is typical of his sickle cell pain. Will aggressively treat his pain.  Pain still not controlled, discussed with Dr. Zigmund Daniel will admit.   The patients results and plan were reviewed and discussed.   Any x-rays performed were independently reviewed by myself.   Differential diagnosis were considered with the presenting HPI.  Medications  diphenhydrAMINE (BENADRYL) capsule 25-50 mg (50 mg Oral Given 10/05/15 1408)  zolpidem (AMBIEN) tablet 10 mg (not administered)  ambrisentan (LETAIRIS) tablet 5 mg (not administered)  lisinopril (PRINIVIL,ZESTRIL) tablet 5 mg (not administered)  rivaroxaban (XARELTO) tablet 20 mg (not administered)  torsemide (DEMADEX) tablet 20 mg (not administered)  Deferasirox TABS 1,080 mg (not administered)  gabapentin (NEURONTIN) capsule 300 mg (300 mg Oral Given 10/05/15 1413)  cholecalciferol (VITAMIN D) tablet 2,000 Units (2,000 Units Oral Given [id number])  folic acid (FOLVITE) tablet 1 mg (1 mg Oral Given 10/05/15 1414)  aspirin chewable tablet 81 mg (81 mg Oral Given 10/05/15 1414)  senna-docusate (Senokot-S) tablet 1 tablet (not administered)  polyethylene glycol  (MIRALAX / GLYCOLAX) packet 17 g (not administered)  dextrose 5 %-0.45 % sodium chloride infusion ( Intravenous New Bag/Given 10/05/15 1438)  ketorolac (TORADOL) 30 MG/ML injection 30 mg (not administered)  naloxone (NARCAN) injection 0.4 mg (not administered)    And  sodium chloride flush (NS) 0.9 % injection 9 mL (not administered)  ondansetron (ZOFRAN) injection 4 mg (not administered)  diphenhydrAMINE (BENADRYL) capsule 25-50 mg (not administered)    Or  diphenhydrAMINE (BENADRYL) 25 mg in sodium chloride 0.9 % 50 mL IVPB (not administered)  HYDROmorphone (DILAUDID) 1 mg/mL PCA injection (1 mL Intravenous Set-up / Initial Syringe 10/05/15 1429)  morphine (MS CONTIN) 12 hr tablet 30 mg (not administered)  dextrose 5 % bolus 1,000 mL (0 mLs Intravenous Stopped 10/05/15 1137)  ketorolac (TORADOL) 30 MG/ML injection 30 mg (30 mg Intravenous Given 10/05/15 0938)  HYDROmorphone (DILAUDID) injection 1.9 mg (1.9 mg Intravenous Given 10/05/15 0938)    Or  HYDROmorphone (DILAUDID) injection 1.9 mg ( Subcutaneous See Alternative 10/05/15 0938)  HYDROmorphone (DILAUDID) injection 2.4 mg (2.4 mg Intravenous Given 10/05/15 1013)    Or  HYDROmorphone (DILAUDID) injection 2.4 mg ( Subcutaneous See Alternative 10/05/15 1013)  HYDROmorphone (DILAUDID) injection 2.9 mg (2.9 mg Intravenous Given 10/05/15 1048)    Or  HYDROmorphone (DILAUDID) injection 2.9 mg ( Subcutaneous See Alternative 10/05/15 1048)    Vitals:   10/05/15 1122 10/05/15 1219 10/05/15 1346 10/05/15 1429  BP:  113/73 101/85   Pulse:  99 97   Resp:  16 18 18   Temp:   98.6 F (37 C)   TempSrc:   Oral   SpO2: 94% 92% 90% 93%  Weight:   170 lb (77.1 kg)   Height:   6' (1.829 m)     Final diagnoses:  Sickle cell pain crisis ([place])    Admission/ observation were discussed with the admitting physician, patient and/or family and they  are comfortable with the plan.    Final Clinical Impressions(s) / ED Diagnoses   Final diagnoses:  Sickle cell  pain crisis [facility])    New Prescriptions Current Discharge Medication List       Deno Etienne, DO 10/05/15 1532

## 2015-10-06 ENCOUNTER — Other Ambulatory Visit: Payer: Self-pay | Admitting: Internal Medicine

## 2015-10-06 ENCOUNTER — Telehealth: Payer: Self-pay

## 2015-10-06 DIAGNOSIS — D638 Anemia in other chronic diseases classified elsewhere: Secondary | ICD-10-CM | POA: Diagnosis not present

## 2015-10-06 DIAGNOSIS — D5700 Hb-SS disease with crisis, unspecified: Secondary | ICD-10-CM | POA: Diagnosis not present

## 2015-10-06 DIAGNOSIS — R0902 Hypoxemia: Secondary | ICD-10-CM | POA: Diagnosis not present

## 2015-10-06 MED ORDER — HEPARIN SOD (PORK) LOCK FLUSH 100 UNIT/ML IV SOLN
500.0000 [IU] | INTRAVENOUS | Status: AC | PRN
  Administered 2015-10-06: 500 [IU]

## 2015-10-06 MED ORDER — FUROSEMIDE 10 MG/ML IJ SOLN
20.0000 mg | Freq: Once | INTRAMUSCULAR | Status: AC
  Administered 2015-10-06: 20 mg via INTRAVENOUS
  Filled 2015-10-06: qty 2

## 2015-10-06 NOTE — Discharge Summary (Signed)
[name] MRN: [id number] DOB/AGE: [birth year] 35 y.o.  Admit date: 10/05/2015 Discharge date: 10/06/2015  Primary Care Physician:  [name], MD   Discharge Diagnoses:   Patient Active Problem List   Diagnosis Date Noted  . Cor pulmonale, chronic ([place]) 08/25/2014    Priority: High  . Pulmonary HTN ([place]) 06/18/2013    Priority: High  . Functional asplenia     Priority: High  . Anemia of chronic disease 06/25/2014    Priority: Medium  . Hb-SS disease with crisis ([place]) 01/22/2014    Priority: Medium  . Chronic anticoagulation 08/22/2013    Priority: Medium  . Avascular necrosis ([place])     Priority: Low  . Sickle cell pain crisis ([place]) 09/18/2015  . Sickle cell anemia with crisis ([place]) 09/04/2015  . Shortness of breath   . Pulmonary embolism without acute cor pulmonale ([place])   . Fever   . Sickle cell anemia with pain ([place]) 05/07/2015  . Hypoxia   . Anemia 01/31/2015  . Symptomatic anemia   . Hb-SS disease without crisis ([place]) 10/07/2014  . PVC's (premature ventricular contractions) 09/10/2014  . Abnormal EKG 09/10/2014  . Chronic atrial fibrillation ([place]) 09/09/2014  . Sickle cell anemia ([name]) 06/25/2014  . PAH (pulmonary artery hypertension) ([place]) 03/18/2014  . Paralytic strabismus, external ophthalmoplegia   . Chronic pain syndrome 12/12/2013  . Essential hypertension 08/22/2013  . Vitamin D deficiency 02/13/2013  . Hx of pulmonary embolus 06/29/2012  . Hemochromatosis 12/14/2011    DISCHARGE MEDICATION:   Medication List    TAKE these medications   ambrisentan 5 MG tablet Commonly known as:  LETAIRIS Take 5 mg by mouth at bedtime.   aspirin 81 MG chewable tablet Chew 1 tablet (81 mg total) by mouth daily.   folic acid 1 MG tablet Commonly known as:  FOLVITE Take 1 tablet (1 mg total) by mouth every morning.   gabapentin 300 MG capsule Commonly known as:  NEURONTIN Take 1 capsule (300 mg total) by mouth 3 (three) times daily.    HYDROmorphone 4 MG tablet Commonly known as:  DILAUDID Take 1 tablet (4 mg total) by mouth every 4 (four) hours as needed for severe pain.   JADENU 360 MG Tabs Generic drug:  Deferasirox Take 1,080 mg by mouth daily.   lisinopril 5 MG tablet Commonly known as:  PRINIVIL,ZESTRIL Take 1 tablet (5 mg total) by mouth daily.   morphine 30 MG tablet Commonly known as:  MSIR Take 1 tablet (30 mg total) by mouth every 12 (twelve) hours.   potassium chloride SA 20 MEQ tablet Commonly known as:  K-DUR,KLOR-CON Take 1 tablet (20 mEq total) by mouth every morning.   rivaroxaban 20 MG Tabs tablet Commonly known as:  XARELTO Take 20 mg by mouth daily.   torsemide 20 MG tablet Commonly known as:  DEMADEX Take 20 mg by mouth daily as needed. For increase in patient weight greater than 3 lbs.   Vitamin D 2000 units tablet Take 1 tablet (2,000 Units total) by mouth daily.   zolpidem 10 MG tablet Commonly known as:  AMBIEN Take 1 tablet (10 mg total) by mouth at bedtime as needed for sleep.         Consults:    SIGNIFICANT DIAGNOSTIC STUDIES:  Dg Chest 2 View  Result Date: 09/26/2015 CLINICAL DATA:  Bilateral rib pain radiating to back for 2 days. History of hypertension and sickle cell. EXAM: CHEST  2 VIEW COMPARISON:  Chest radiograph September 23, 2015  FINDINGS: The cardiac silhouette is moderately enlarged and unchanged. Mediastinal silhouette is nonsuspicious. Stable mild bibasilar strandy densities, unchanged. No pleural effusion or focal consolidation. No pneumothorax. Single lumen LEFT chest Port-A-Cath with distal tip projecting cavoatrial junction. No pneumothorax. Soft tissue planes included osseous structures are nonsuspicious. Surgical clips in the included right abdomen compatible with cholecystectomy. IMPRESSION: Stable cardiomegaly.  No acute pulmonary process. Electronically Signed   By: Elon Alas M.D.   On: 09/26/2015 04:21   Dg Chest 2 View  Result Date:  09/23/2015 CLINICAL DATA:  Fever today EXAM: CHEST  2 VIEW COMPARISON:  09/18/2015 FINDINGS: Left chest wall port is again seen and stable. Cardiac shadow remains enlarged. The lungs are well aerated bilaterally. Diffuse interstitial changes are again seen stable from the prior study. No focal infiltrate is seen. IMPRESSION: No acute abnormality noted. Electronically Signed   By: Inez Catalina M.D.   On: 09/23/2015 13:04   Dg Chest 2 View  Result Date: 09/18/2015 CLINICAL DATA:  Status post fall, with upper anterior chest pain. Initial encounter. EXAM: CHEST  2 VIEW COMPARISON:  Chest radiograph performed 09/01/2015 FINDINGS: The lungs are well-aerated. Mild vascular congestion is noted, with mild bibasilar atelectasis. There is no evidence of pleural effusion or pneumothorax. The heart is enlarged. A left-sided chest port ends about the cavoatrial junction. No acute osseous abnormalities are seen. IMPRESSION: Mild vascular congestion and cardiomegaly noted. Mild bibasilar atelectasis seen. No displaced rib fractures identified. Electronically Signed   By: Garald Balding M.D.   On: 09/18/2015 02:17   Dg Lumbar Spine Complete  Result Date: 09/18/2015 CLINICAL DATA:  Status post fall, with lower back pain radiating to both hips. Initial encounter. EXAM: LUMBAR SPINE - COMPLETE 4+ VIEW COMPARISON:  CT of the abdomen and pelvis from 06/03/2015 FINDINGS: There is no evidence of fracture or subluxation. Vertebral bodies demonstrate normal height and alignment. Intervertebral disc spaces are preserved. The visualized neural foramina are grossly unremarkable in appearance. A right hip arthroplasty is only partially imaged but appears grossly unremarkable. The visualized bowel gas pattern is unremarkable in appearance; air and stool are noted within the colon. The sacroiliac joints are within normal limits. Clips are noted within the right upper quadrant, reflecting prior cholecystectomy. IMPRESSION: No evidence of  fracture or subluxation along the lumbar spine. Electronically Signed   By: Garald Balding M.D.   On: 09/18/2015 02:19   Dg Hip Unilat W Or Wo Pelvis 2-3 Views Right  Result Date: 09/18/2015 CLINICAL DATA:  Status post fall, landing on right side, with right hip pain. Initial encounter. EXAM: DG HIP (WITH OR WITHOUT PELVIS) 2-3V RIGHT COMPARISON:  None. FINDINGS: There is no evidence of fracture or dislocation. The patient's right hip arthroplasty is grossly unremarkable in appearance, without evidence of loosening. Underlying screws are noted along the right acetabulum. Both femoral heads are seated normally within their respective acetabula. The proximal right femur appears intact. No significant degenerative change is appreciated. The sacroiliac joints are unremarkable in appearance. The visualized bowel gas pattern is grossly unremarkable in appearance. IMPRESSION: No evidence of fracture or dislocation. Right hip arthroplasty is grossly intact, without evidence of loosening. Electronically Signed   By: Garald Balding M.D.   On: 09/18/2015 02:16      No results found for this or any previous visit (from the past 240 hour(s)).  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  The patient states that he's been having increased pain in his ribs and back 1 week. He attempted to manage his pain but his oral medications however he got to the point was ineffective and thus he came to the emergency room seeking further medical attention. The patient is scheduled to have his exchange transfusion sometime in the next week and reports that he really just once to try to get the pain down to manageable level so that he can get his exchange transfusion which always improves his pain and his overall medical condition after having received it. He denies any fever, chills, nausea, vomiting, diarrhea, focal neurological  deficits or headaches.  In the emergency room he was found to be hypoxic with saturation of 90% on room air. He received Dilaudid IV push 3 and Toradol. In addition he received IV fluids and the pain is still on manageable with oral medications. I'm asked to admit the patient for management of acute sickle cell pain crisis.    Hospital Course:  Present on Admission: . Sickle cell pain crisis [facility]): Pt admitted yesterday with simple sickle cell crisis. This would have been appropriate for the day hospital management however the Day Hospital was closed yesterday. He was managed with IVF, Toradol and IV Dilaudid via PCA. He started with a pain score of 9/10 and at the time of discharge his pain was at 6/10. He is scheduled for red cell pheresis this morning at [facility] which will definitively treat his SCD disease by decreasing percentage of Hb S. Although his pain is not optimally controlled the benefit of this out patient therapy far outweighs his continued hospitalization.   . Hypoxemia: Pt has some mild hypoxemia which is in large part contributed to by hypoventilation as his oxygen saturations improve to normal with use of incentive spirometer. Patient has Oxygen at home which he will use for transfer to [facility] by Private vehicle. I expect that his oxygenation will also improve after his red cell pheresis.   . Anemia of chronic Disease: His Hb was at baseline on admission yesterday (< 24 hours ago). He will receive Hb evaluation during Red Cell Pheresis.   . Chronic Pain Syndrome: Continue MS Contin.  . Chronic Anticoagulation: Continue Xarelto.   Disposition and Follow-up: Pt discharged in stable condition and has an appointment at [facility] for [place] today.    DISCHARGE EXAM:  General: Alert, awake, oriented x 3, in mild distress secondary to pain.  HEENT: [place]/AT PEERL, EOMI, mild icterus. Neck: Trachea midline, no masses, no thyromegal,y no JVD, no carotid  bruit OROPHARYNX: Moist, No exudate/ erythema/lesions.  Heart: Regular rate and rhythm, without murmurs, rubs, gallops or S3. PMI non-displaced. Exam reveals no decreased pulses. Pulmonary/Chest: Normal effort. Breath sounds normal. No. Apnea. Clear to auscultation,no stridor,  no wheezing and no rhonchi noted. No respiratory distress and no tenderness noted. Abdomen: Soft, nontender, nondistended, normal bowel sounds, no masses no hepatosplenomegaly noted. No fluid wave and no ascites. There is no guarding or rebound. Neuro: Alert and oriented to person, place and time. Normal motor skills, Displays no atrophy or tremors and exhibits normal muscle tone.  No focal neurological deficits noted cranial nerves II through XII grossly intact. No sensory deficit noted.  Strength at baseline in bilateral upper and lower extremities. Gait normal. Musculoskeletal: No warm swelling or erythema around joints, no spinal tenderness noted. Psychiatric: Patient alert and oriented x3, good insight and cognition, good recent to remote recall. Mood, memory,  affect and judgement norm Lymph node survey: No cervical axillary or inguinal lymphadenopathy noted. Skin: Skin is warm and dry. No bruising, no ecchymosis and no rash noted. Pt is not diaphoretic. No erythema. No pallor al   Blood pressure (!) 131/92, pulse 96, temperature 98.7 F (37.1 C), temperature source Oral, resp. rate 16, height 6' (1.829 m), weight 77.1 kg (170 lb), SpO2 95% on 2 L   Recent Labs  10/04/15 0504 10/05/15 0110  NA 144 140  K 3.8 4.0  CL 107 114*  CO2  --  21*  GLUCOSE 105* 109*  BUN 14 13  CREATININE 1.20 0.82  CALCIUM  --  8.8*    Recent Labs  10/05/15 0110  AST 24  ALT 15*  ALKPHOS 75  BILITOT 3.7*  PROT 7.4  ALBUMIN 4.1   No results for input(s): LIPASE, AMYLASE in the last 72 hours.  Recent Labs  10/04/15 0444 10/04/15 0504 10/05/15 0110  WBC 13.9*  --  14.5*  NEUTROABS 10.2*  --  10.2*  HGB 8.1* 7.8*  7.2*  HCT 23.5* 23.0* 20.9*  MCV 91.1  --  89.7  PLT 478*  --  515*     Total time spent including face to face and decision making was greater than 30 minutes  Signed: Rivka Baune A. 10/06/2015, 8:25 AM

## 2015-10-06 NOTE — Care Management Obs Status (Signed)
[facility] NOTIFICATION   Patient Details  Name: [name] MRN: [id number] Date of Birth: 10-Oct-1980   Medicare Observation Status Notification Given:  Yes    MahabirJuliann Pulse, RN 10/06/2015, 9:57 AM

## 2015-10-06 NOTE — Care Management Note (Signed)
Case Management Note  Patient Details  Name: KELSEN KEENAN MRN: [id number] Date of Birth: [birth year]  Subjective/Objective: 35 y/o m admitted w/SSC. From home. [facility] dme travel home 02 tank brought to rm prior d/c. No further CM needs.                   Action/Plan:d/c home.   Expected Discharge Date:   (unknown)               Expected Discharge Plan:  Home/Self Care  In-House Referral:     Discharge planning Services     Post Acute Care Choice:    Choice offered to:     DME Arranged:    DME Agency:     HH Arranged:    [place] Agency:     Status of Service:  Completed, signed off  If discussed at [employer] of Stay Meetings, dates discussed:    Additional Comments:  Dessa Phi, RN 10/06/2015, 9:58 AM

## 2015-10-07 DIAGNOSIS — Z86711 Personal history of pulmonary embolism: Secondary | ICD-10-CM | POA: Diagnosis not present

## 2015-10-07 DIAGNOSIS — Z8673 Personal history of transient ischemic attack (TIA), and cerebral infarction without residual deficits: Secondary | ICD-10-CM | POA: Diagnosis not present

## 2015-10-07 DIAGNOSIS — R0781 Pleurodynia: Secondary | ICD-10-CM | POA: Diagnosis not present

## 2015-10-07 DIAGNOSIS — Z8679 Personal history of other diseases of the circulatory system: Secondary | ICD-10-CM | POA: Diagnosis not present

## 2015-10-07 DIAGNOSIS — R160 Hepatomegaly, not elsewhere classified: Secondary | ICD-10-CM | POA: Diagnosis not present

## 2015-10-07 DIAGNOSIS — R932 Abnormal findings on diagnostic imaging of liver and biliary tract: Secondary | ICD-10-CM | POA: Diagnosis not present

## 2015-10-07 DIAGNOSIS — I272 Other secondary pulmonary hypertension: Secondary | ICD-10-CM | POA: Diagnosis not present

## 2015-10-07 DIAGNOSIS — J9601 Acute respiratory failure with hypoxia: Secondary | ICD-10-CM | POA: Diagnosis not present

## 2015-10-07 IMAGING — CT CT ANGIO CHEST
2 of 6 series · 19 of 46 positions shown · IV contrast (OMNIPAQUE)
Comparison: CT chest 08/22/2012. Plain films of the chest earlier
this same day.

CLINICAL DATA: Sickle cell disease. The left mid chest pain.

EXAM:
CT ANGIOGRAPHY CHEST WITH CONTRAST
TECHNIQUE: Multidetector CT imaging of the chest was performed using the
standard protocol during bolus administration of intravenous
contrast. Multiplanar CT image reconstructions including MIPs were
obtained to evaluate the vascular anatomy.
CONTRAST:  100mL OMNIPAQUE IOHEXOL 350 MG/ML SOLN

[Series 5: pe thins @ 1mm · axial · 0.75mm/px · z∈[-200,+28]mm · 16 of 250 slices shown]
[im 11/250  lung]
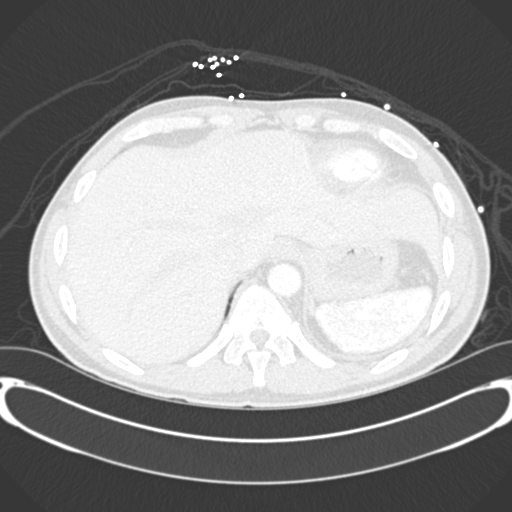
[im 33/250  soft-tissue]
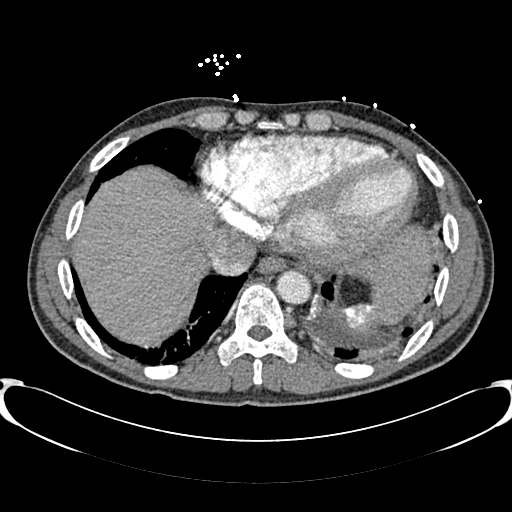
[im 44/250  lung]
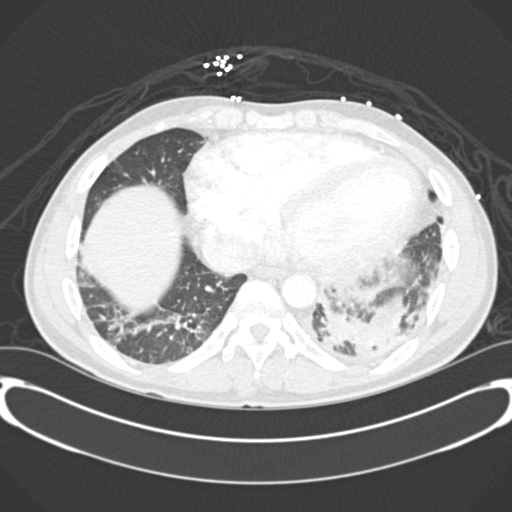
[im 55/250  soft-tissue]
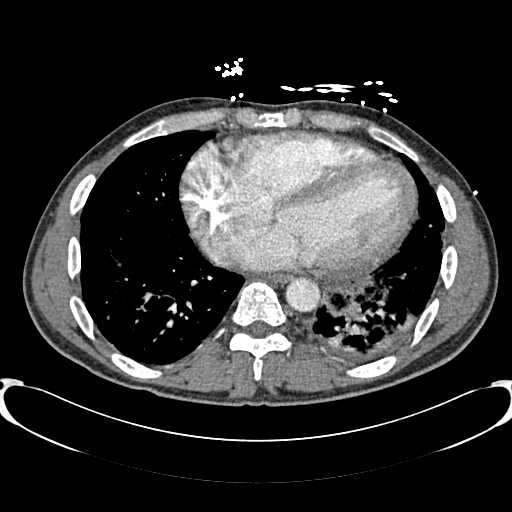
[im 76/250  lung]
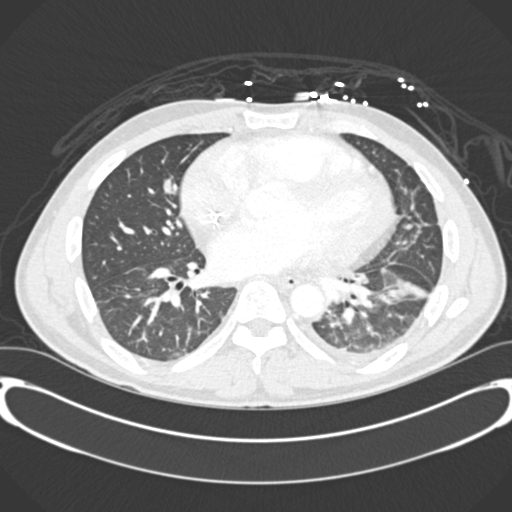
[im 87/250  soft-tissue]
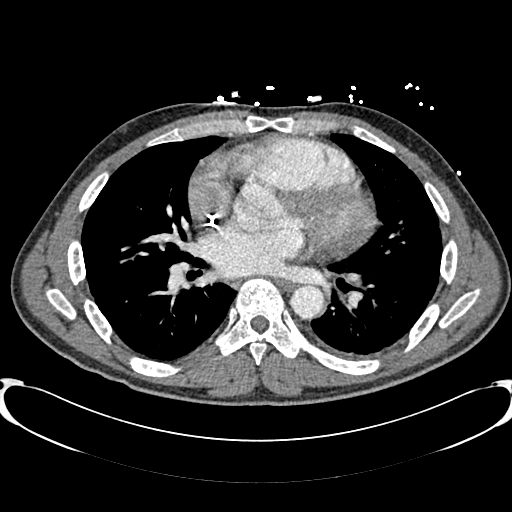
[im 98/250  lung]
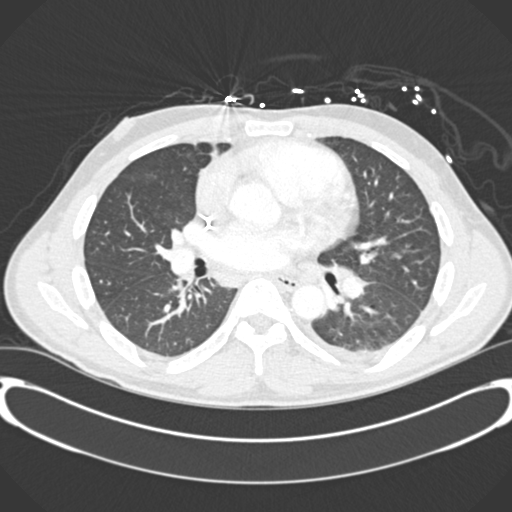
[im 120/250  soft-tissue]
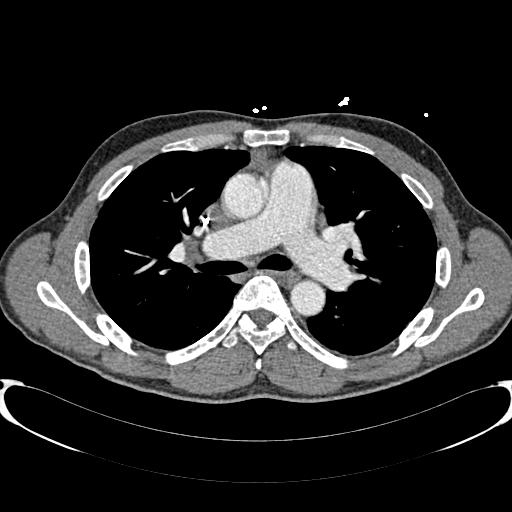
[im 130/250  lung]
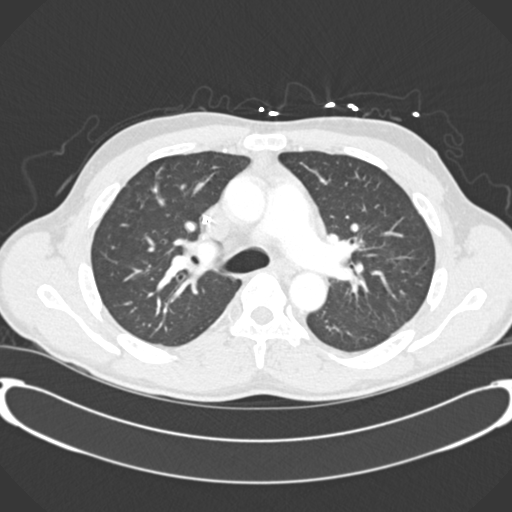
[im 152/250  soft-tissue]
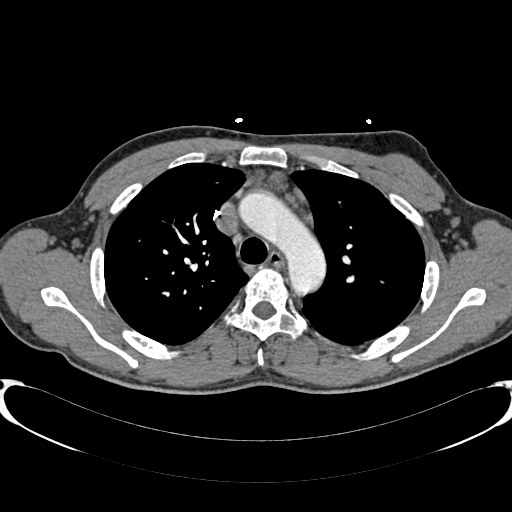
[im 163/250  lung]
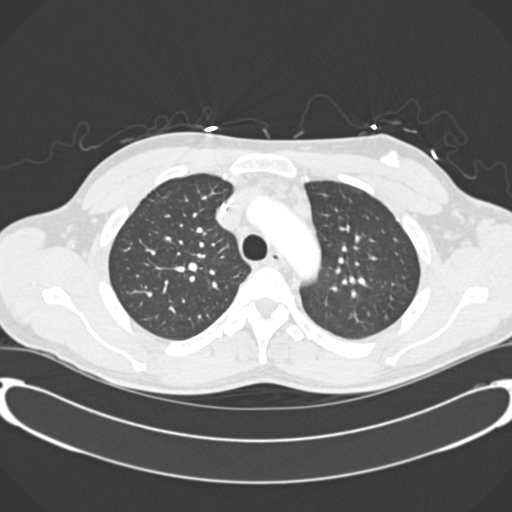
[im 174/250  soft-tissue]
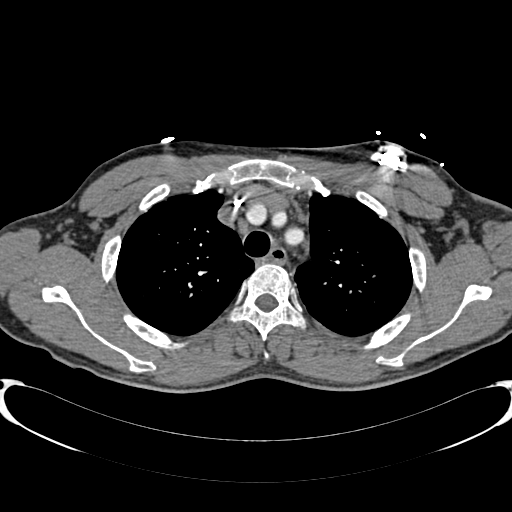
[im 195/250  lung]
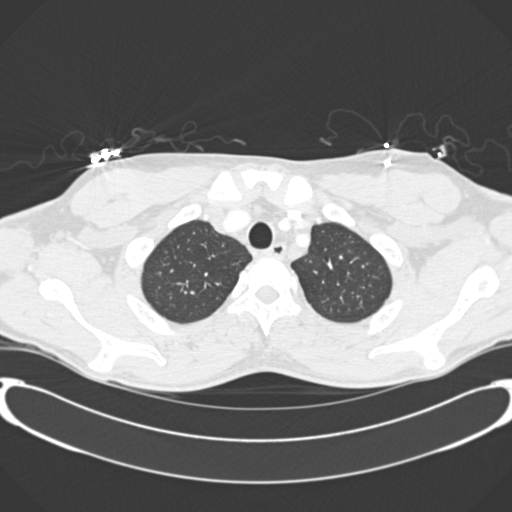
[im 206/250  soft-tissue]
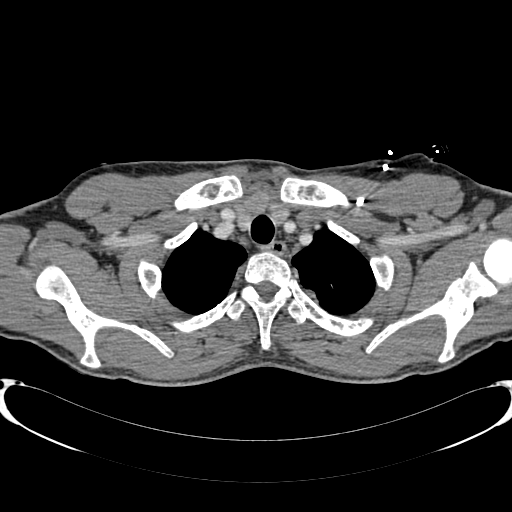
[im 217/250  lung]
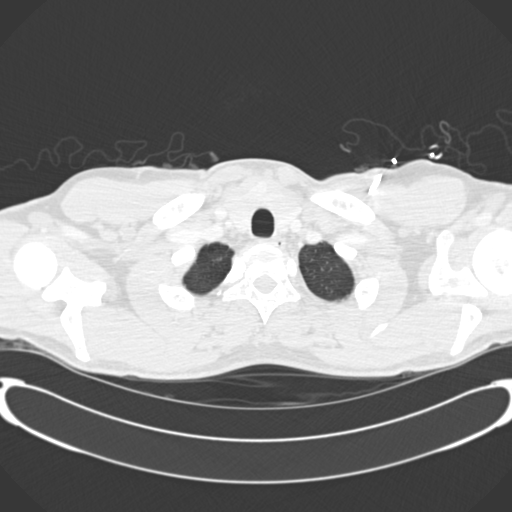
[im 239/250  soft-tissue]
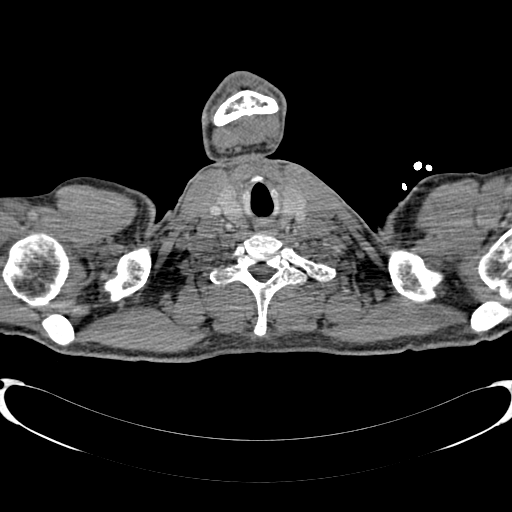

[Series 605: coronal mpr · coronal · 0.75mm/px · 3 of 118 slices shown]
[im 30/118  soft-tissue]
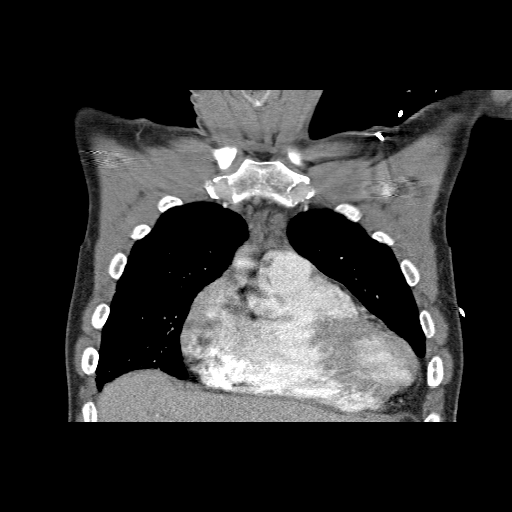
[im 59/118  soft-tissue]
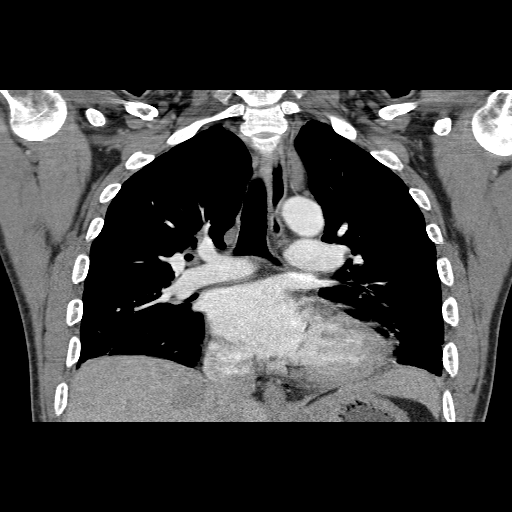
[im 88/118  soft-tissue]
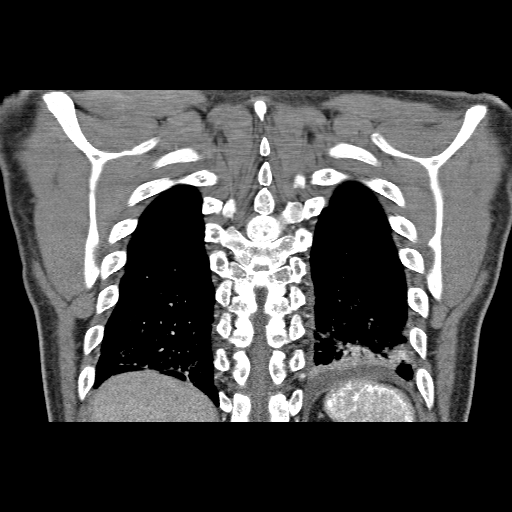

[19 of 46 positions shown; findings below may reference images not displayed]

FINDINGS: No pulmonary embolus is identified. There is cardiomegaly. Small
left pleural effusion is identified. Multiple small mediastinal
lymph nodes are again seen, not notably changed. Lungs demonstrate
basilar airspace disease, worse on the left, where changes are
worrisome for pneumonia. Right basilar airspace disease is more in
keeping with atelectasis.

Incidentally imaged upper abdomen demonstrates calcification of the
spleen consistent with auto infarction. Bony changes of sickle cell
disease appear stable.

Review of the MIP images confirms the above findings.
IMPRESSION: Small left effusion and basilar airspace disease worrisome for
pneumonia.

Negative for pulmonary embolus.

Cardiomegaly.

No change in small mediastinal lymph nodes, likely reactive.

## 2015-10-07 IMAGING — CR DG CHEST 2V
2 series · 2 of 2 positions shown · non-contrast
Comparison: 10/19/2012

CLINICAL DATA: Left-sided chest pain, history of sickle cell anemia

EXAM:
CHEST  2 VIEW

[w chest pa]
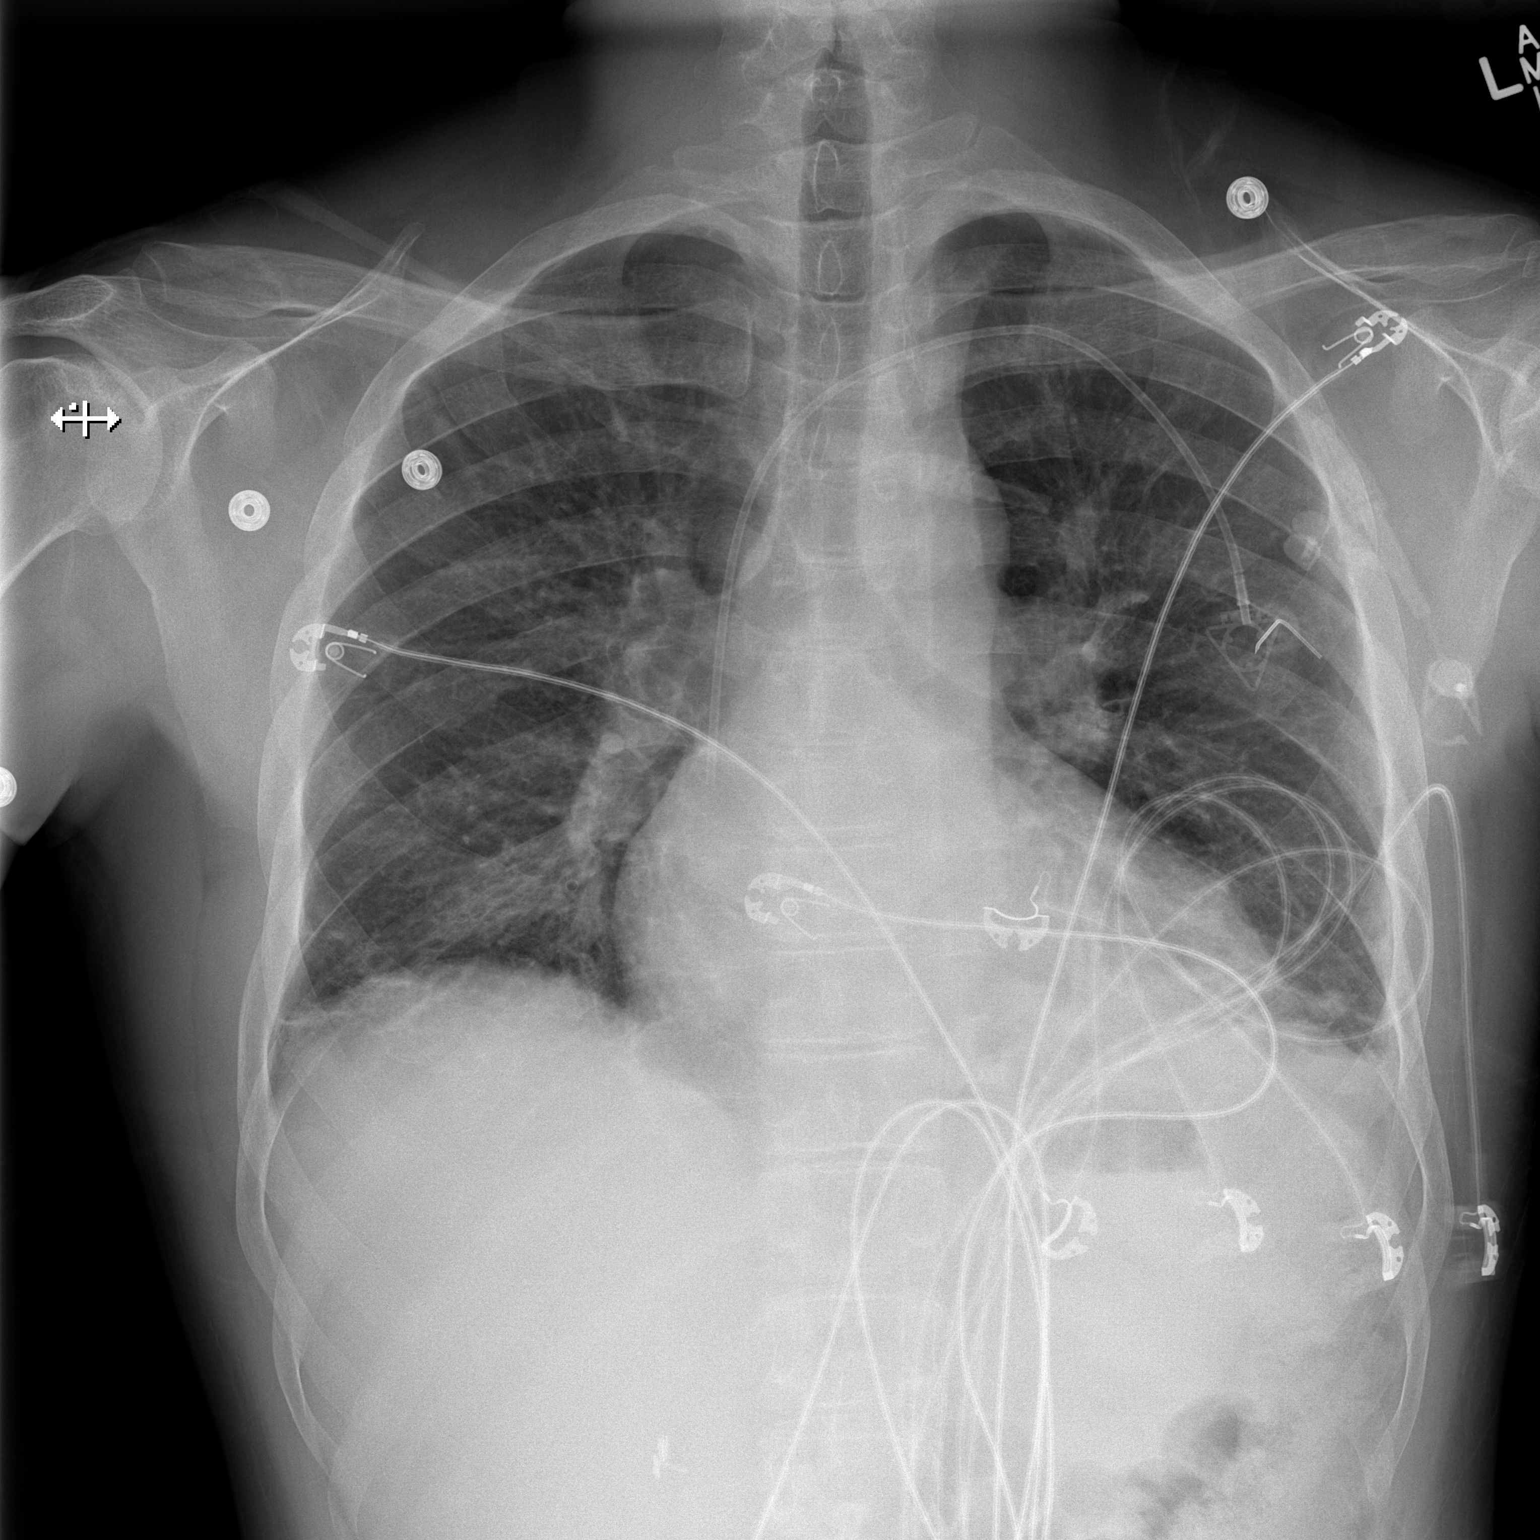

[w chest lat]
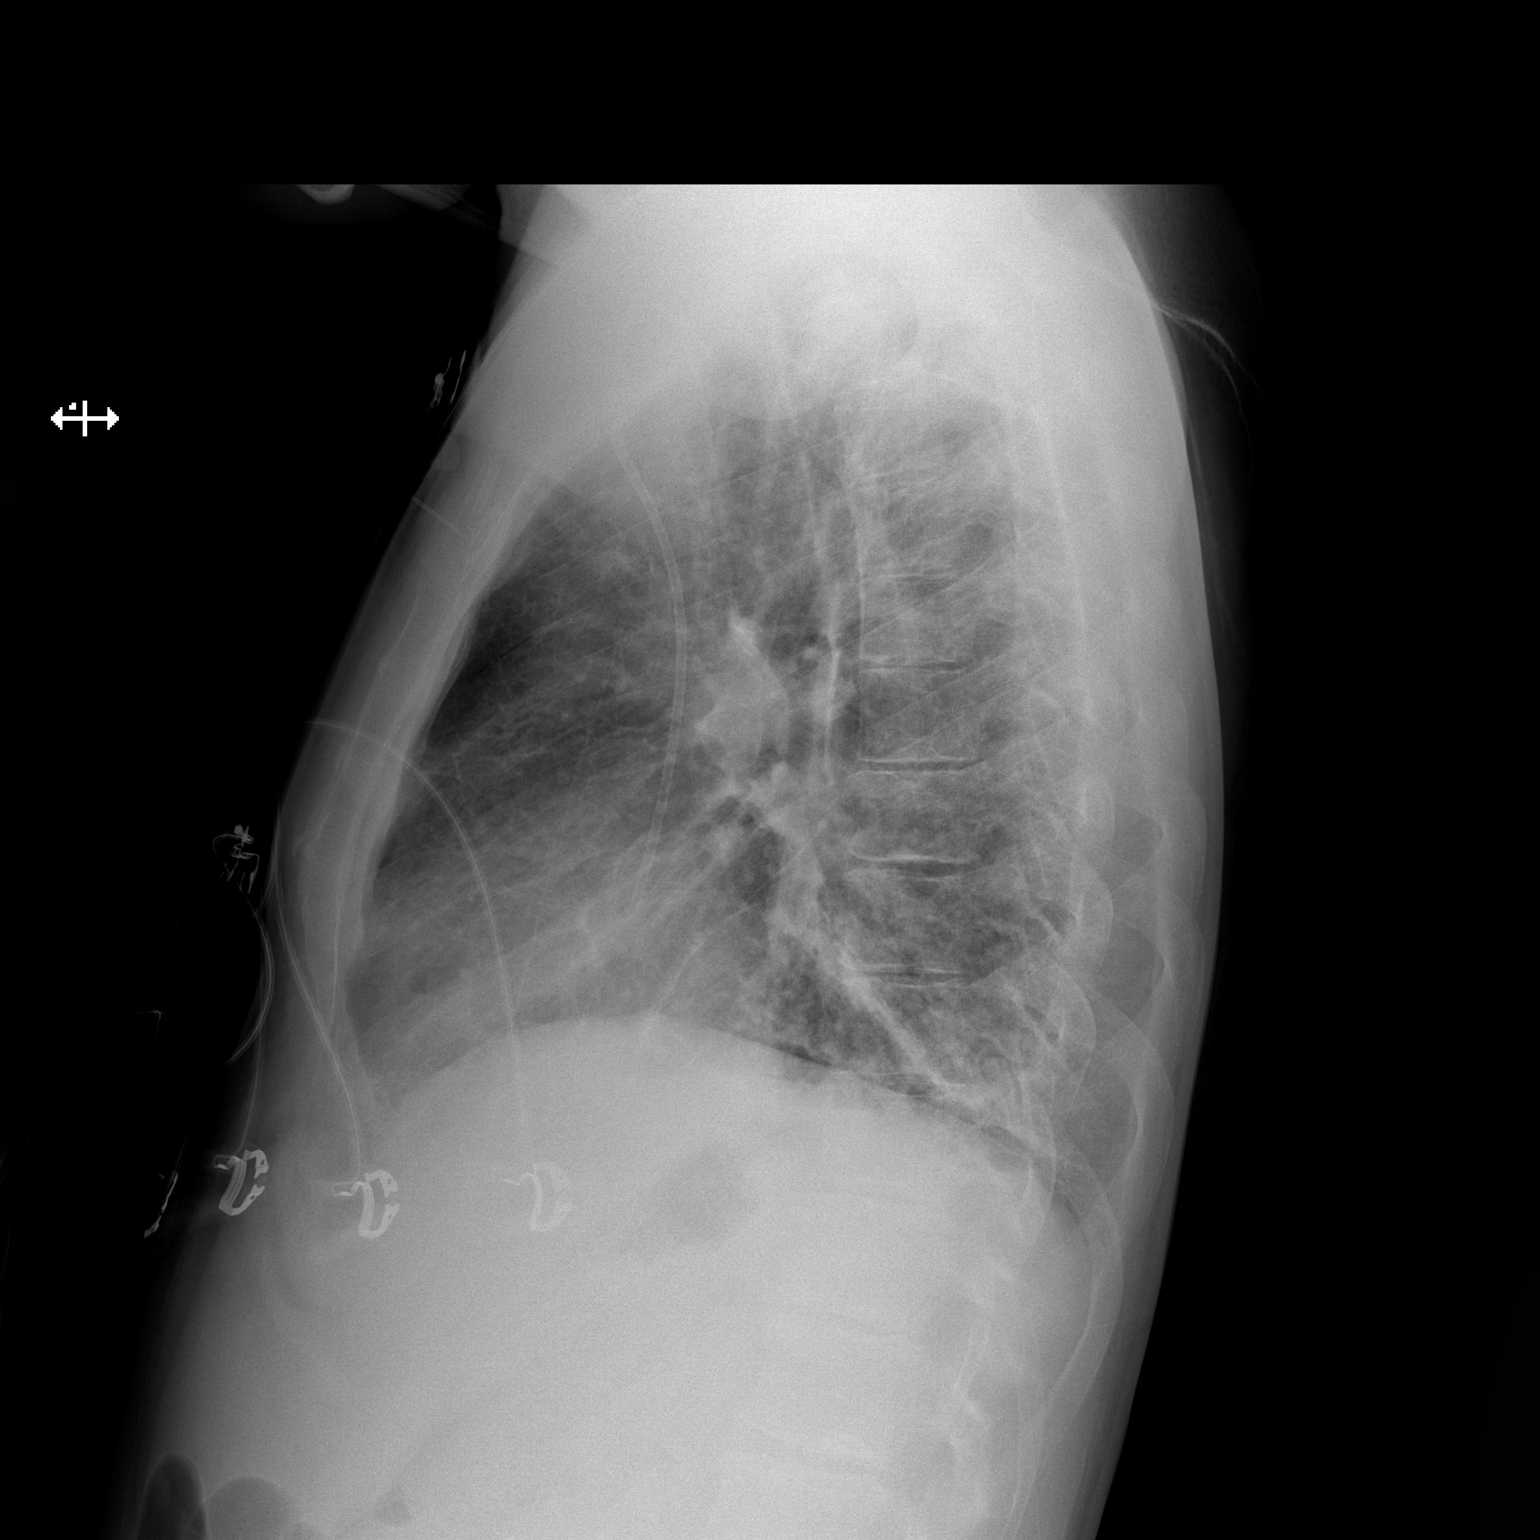

[2 of 2 positions shown; findings below may reference images not displayed]

FINDINGS: The cardiac shadow is stable but mildly enlarged. A left chest wall
port is again seen and stable. Mild bibasilar atelectatic changes
are seen. No focal confluent infiltrate is seen. Mild interstitial
changes are noted throughout both lungs. No acute bony abnormality
is noted.
IMPRESSION: Bibasilar atelectatic changes superimposed over chronic interstitial
change.

## 2015-10-08 DIAGNOSIS — D5700 Hb-SS disease with crisis, unspecified: Secondary | ICD-10-CM | POA: Diagnosis not present

## 2015-10-08 DIAGNOSIS — J9601 Acute respiratory failure with hypoxia: Secondary | ICD-10-CM | POA: Diagnosis not present

## 2015-10-08 DIAGNOSIS — Z8673 Personal history of transient ischemic attack (TIA), and cerebral infarction without residual deficits: Secondary | ICD-10-CM | POA: Diagnosis not present

## 2015-10-08 DIAGNOSIS — I272 Other secondary pulmonary hypertension: Secondary | ICD-10-CM | POA: Diagnosis not present

## 2015-10-08 DIAGNOSIS — R160 Hepatomegaly, not elsewhere classified: Secondary | ICD-10-CM | POA: Diagnosis not present

## 2015-10-08 DIAGNOSIS — R0781 Pleurodynia: Secondary | ICD-10-CM | POA: Diagnosis not present

## 2015-10-08 DIAGNOSIS — Z8679 Personal history of other diseases of the circulatory system: Secondary | ICD-10-CM | POA: Diagnosis not present

## 2015-10-08 DIAGNOSIS — Z86711 Personal history of pulmonary embolism: Secondary | ICD-10-CM | POA: Diagnosis not present

## 2015-10-09 ENCOUNTER — Telehealth: Payer: Self-pay

## 2015-10-09 ENCOUNTER — Other Ambulatory Visit: Payer: Self-pay | Admitting: Family Medicine

## 2015-10-09 DIAGNOSIS — I272 Other secondary pulmonary hypertension: Secondary | ICD-10-CM | POA: Diagnosis not present

## 2015-10-09 DIAGNOSIS — R160 Hepatomegaly, not elsewhere classified: Secondary | ICD-10-CM | POA: Diagnosis not present

## 2015-10-09 DIAGNOSIS — J9601 Acute respiratory failure with hypoxia: Secondary | ICD-10-CM | POA: Diagnosis not present

## 2015-10-09 DIAGNOSIS — R0781 Pleurodynia: Secondary | ICD-10-CM | POA: Diagnosis not present

## 2015-10-09 DIAGNOSIS — Z8673 Personal history of transient ischemic attack (TIA), and cerebral infarction without residual deficits: Secondary | ICD-10-CM | POA: Diagnosis not present

## 2015-10-09 DIAGNOSIS — D571 Sickle-cell disease without crisis: Secondary | ICD-10-CM | POA: Diagnosis not present

## 2015-10-09 DIAGNOSIS — Z8679 Personal history of other diseases of the circulatory system: Secondary | ICD-10-CM | POA: Diagnosis not present

## 2015-10-09 DIAGNOSIS — G4700 Insomnia, unspecified: Secondary | ICD-10-CM

## 2015-10-09 DIAGNOSIS — Z86711 Personal history of pulmonary embolism: Secondary | ICD-10-CM | POA: Diagnosis not present

## 2015-10-09 MED ORDER — MORPHINE SULFATE 30 MG PO TABS: 30.0000 mg | ORAL_TABLET | Freq: Two times a day (BID) | ORAL | 0 refills | Status: DC

## 2015-10-09 MED ORDER — HYDROMORPHONE HCL 4 MG PO TABS: 4.0000 mg | ORAL_TABLET | ORAL | 0 refills | Status: DC | PRN

## 2015-10-09 MED ORDER — ZOLPIDEM TARTRATE 10 MG PO TABS: 10.0000 mg | ORAL_TABLET | Freq: Every evening | ORAL | 0 refills | Status: DC | PRN

## 2015-10-10 DIAGNOSIS — D5700 Hb-SS disease with crisis, unspecified: Secondary | ICD-10-CM | POA: Diagnosis not present

## 2015-10-10 DIAGNOSIS — R160 Hepatomegaly, not elsewhere classified: Secondary | ICD-10-CM | POA: Diagnosis not present

## 2015-10-10 DIAGNOSIS — D571 Sickle-cell disease without crisis: Secondary | ICD-10-CM | POA: Diagnosis not present

## 2015-10-10 DIAGNOSIS — Z8679 Personal history of other diseases of the circulatory system: Secondary | ICD-10-CM | POA: Diagnosis not present

## 2015-10-11 DIAGNOSIS — R160 Hepatomegaly, not elsewhere classified: Secondary | ICD-10-CM | POA: Diagnosis not present

## 2015-10-11 DIAGNOSIS — Z8679 Personal history of other diseases of the circulatory system: Secondary | ICD-10-CM | POA: Diagnosis not present

## 2015-10-11 DIAGNOSIS — D571 Sickle-cell disease without crisis: Secondary | ICD-10-CM | POA: Diagnosis not present

## 2015-10-11 DIAGNOSIS — D5700 Hb-SS disease with crisis, unspecified: Secondary | ICD-10-CM | POA: Diagnosis not present

## 2015-10-11 IMAGING — CT CT CHEST W/ CM
2 of 3 series · 15 of 36 positions shown, 18 images · IV contrast (OMNIPAQUE)
Comparison: 11/13/2012

CLINICAL DATA: Right-sided chest pain. Elevated white count.

EXAM:
CT CHEST WITH CONTRAST
TECHNIQUE: Multidetector CT imaging of the chest was performed during
intravenous contrast administration.
CONTRAST:  80mL OMNIPAQUE IOHEXOL 300 MG/ML  SOLN

[Series 2: chest with st · axial · 0.74mm/px · z∈[+1614,+1884]mm · 12 of 64 slices shown, 15 images]
[im 5/64  mediastinal]
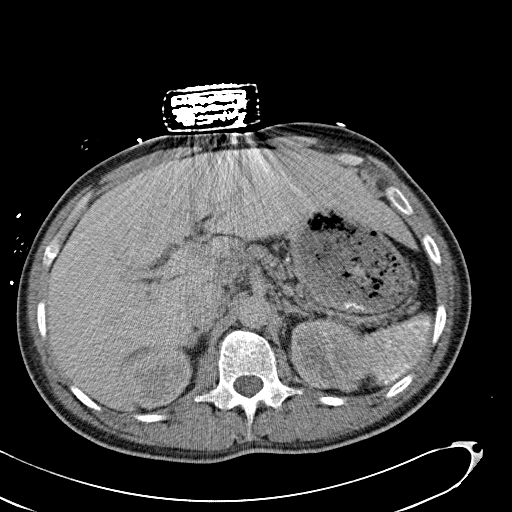
[im 5/64  lung]
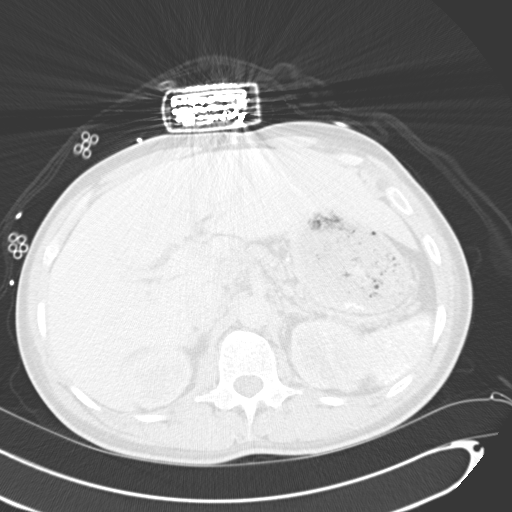
[im 10/64  lung]
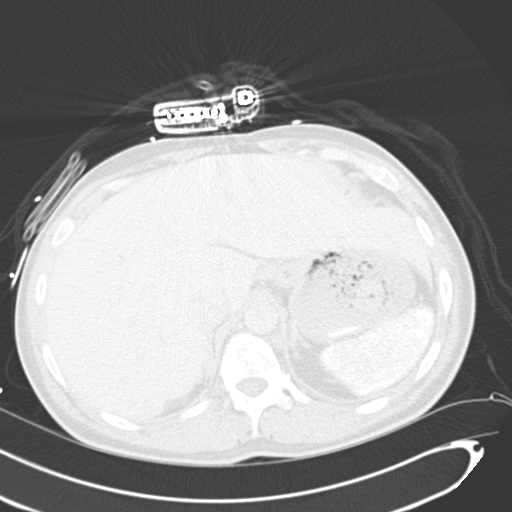
[im 15/64  lung]
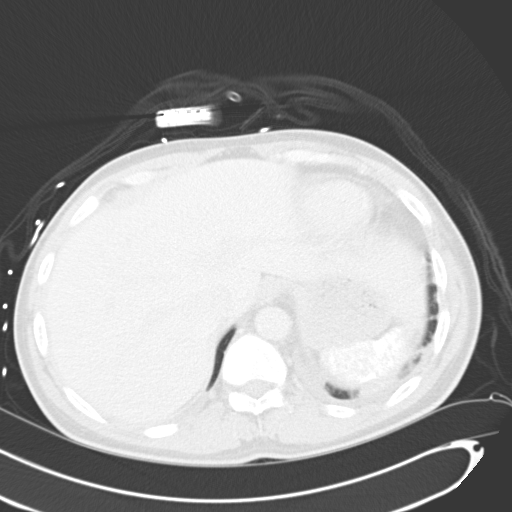
[im 19/64  lung]
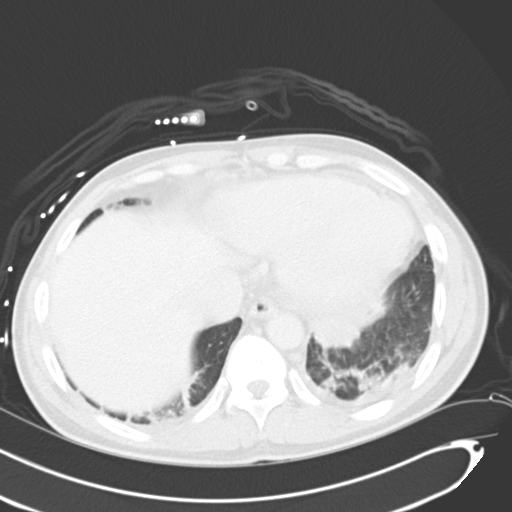
[im 24/64  mediastinal]
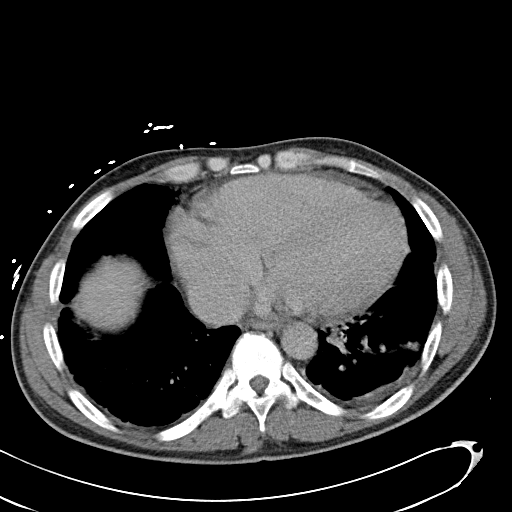
[im 24/64  lung]
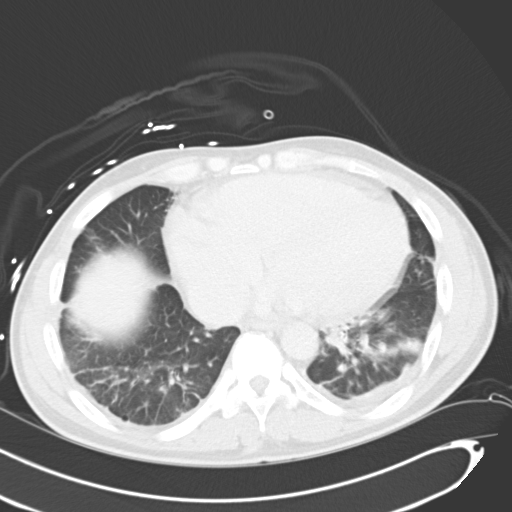
[im 29/64  lung]
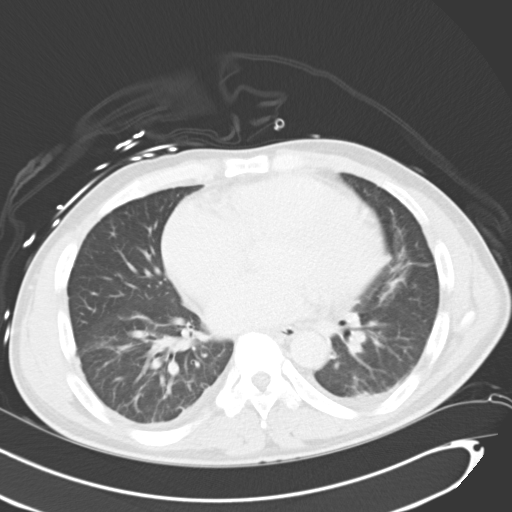
[im 36/64  lung]
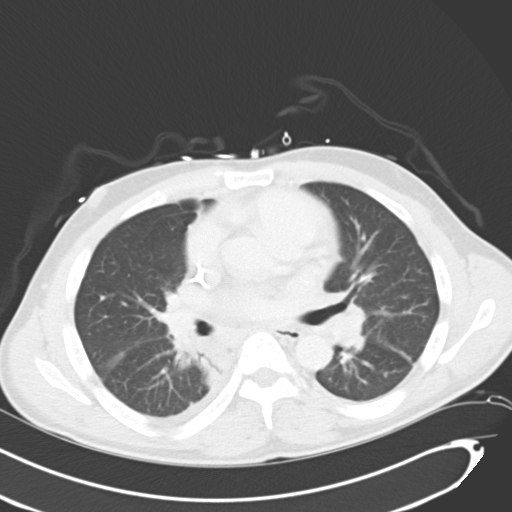
[im 40/64  lung]
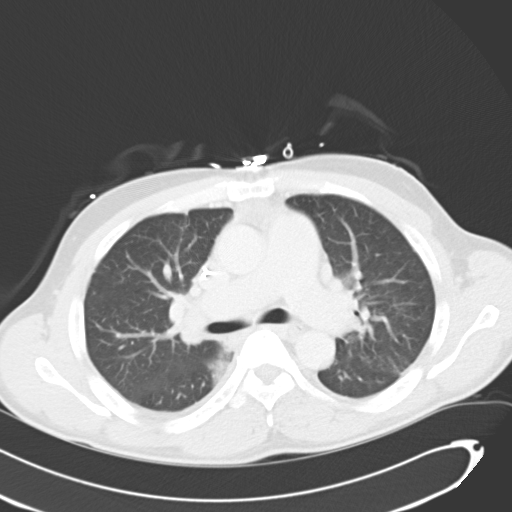
[im 45/64  mediastinal]
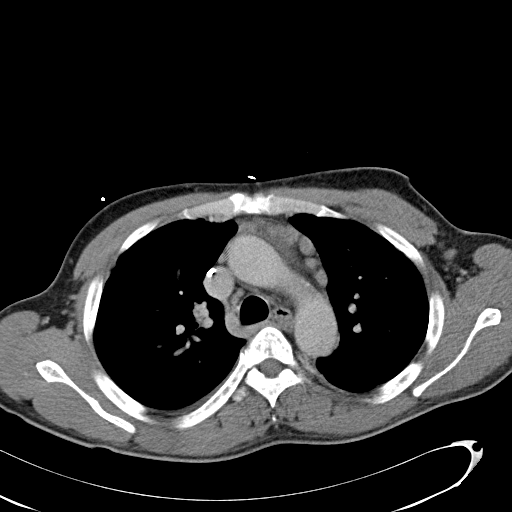
[im 45/64  lung]
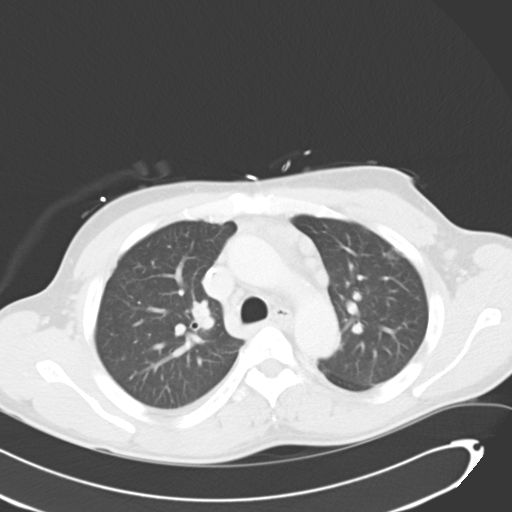
[im 50/64  lung]
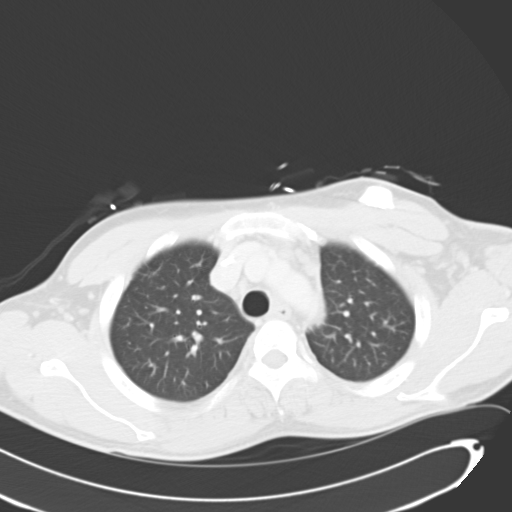
[im 54/64  lung]
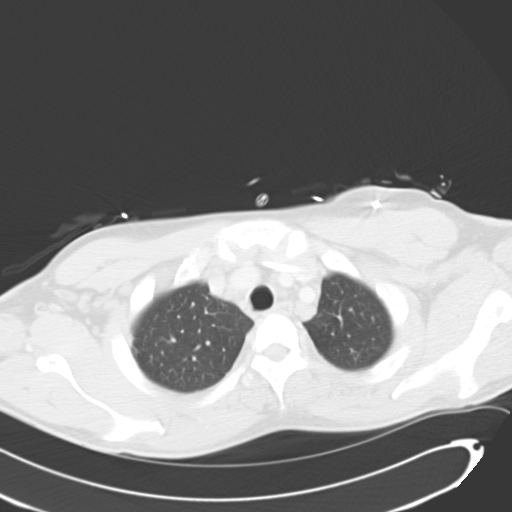
[im 59/64  lung]
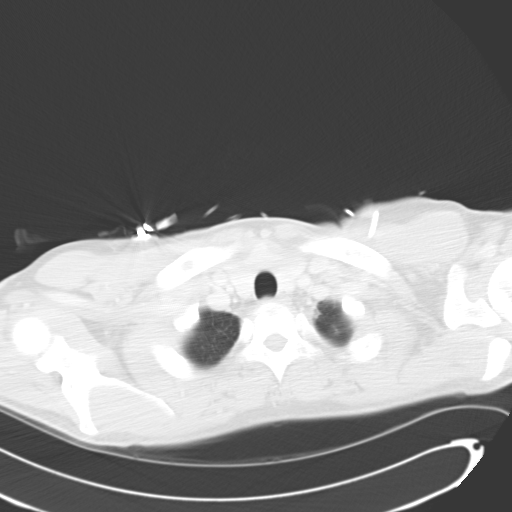

[Series 602: coronal · coronal · 0.74mm/px · 3 of 103 slices shown]
[im 21/103  lung]
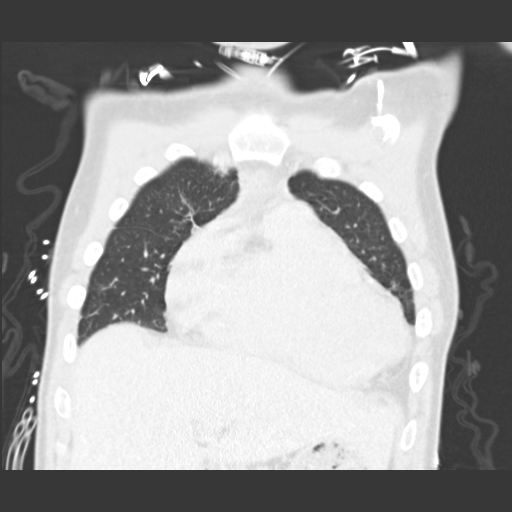
[im 41/103  lung]
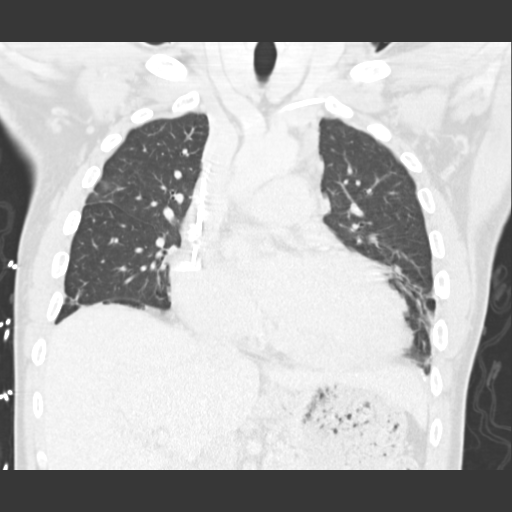
[im 62/103  lung]
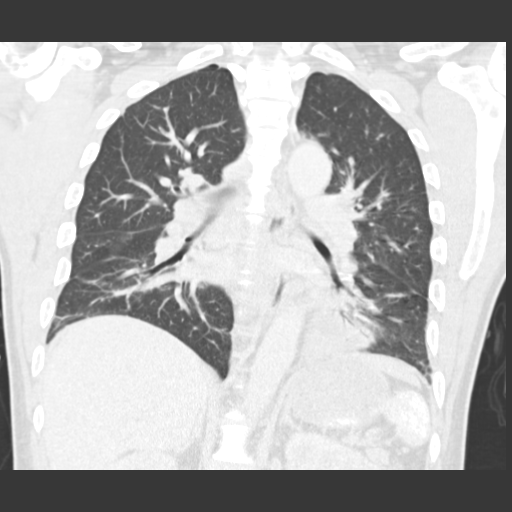

[15 of 36 positions shown; findings below may reference images not displayed]

FINDINGS: Left Port-A-Cath remains in place, unchanged. There is mediastinal
adenopathy, stable since prior study. Cardiomegaly, stable. Aorta is
normal caliber.

Area of consolidation and air bronchograms medially in the right
lower lobe, best seen on image 28. Cannot exclude pneumonia.
Bilateral lower lobe airspace opacities. The left basilar opacity
has improved since prior study. Findings on the right are similar,
likely atelectasis. Trace bilateral pleural effusions.

Imaging into the upper abdomen demonstrates calcifications within
the spleen compatible with auto infarction. Sickle cell bony changes
noted.
IMPRESSION: Airspace disease within the medial right lower lobe concerning for
pneumonia. Improving opacity in the left lung base. Similar opacity
in the right lung base, likely atelectasis.

Trace bilateral effusions.

Stable cardiomegaly and mildly prominent mediastinal lymph nodes.

## 2015-10-12 ENCOUNTER — Telehealth: Payer: Self-pay | Admitting: Internal Medicine

## 2015-10-12 DIAGNOSIS — R160 Hepatomegaly, not elsewhere classified: Secondary | ICD-10-CM | POA: Diagnosis not present

## 2015-10-12 DIAGNOSIS — Z8679 Personal history of other diseases of the circulatory system: Secondary | ICD-10-CM | POA: Diagnosis not present

## 2015-10-12 DIAGNOSIS — D5700 Hb-SS disease with crisis, unspecified: Secondary | ICD-10-CM | POA: Diagnosis not present

## 2015-10-12 DIAGNOSIS — I517 Cardiomegaly: Secondary | ICD-10-CM | POA: Diagnosis not present

## 2015-10-12 NOTE — Telephone Encounter (Signed)
[name] from [employer] calling to find out the status of a form, a certificate of medical necessity of oxygen,  that was faxed over in August  CB#: [id number] ext. [phone]

## 2015-10-13 DIAGNOSIS — R160 Hepatomegaly, not elsewhere classified: Secondary | ICD-10-CM | POA: Diagnosis not present

## 2015-10-13 DIAGNOSIS — Z8679 Personal history of other diseases of the circulatory system: Secondary | ICD-10-CM | POA: Diagnosis not present

## 2015-10-13 DIAGNOSIS — J9601 Acute respiratory failure with hypoxia: Secondary | ICD-10-CM | POA: Diagnosis not present

## 2015-10-13 DIAGNOSIS — D5700 Hb-SS disease with crisis, unspecified: Secondary | ICD-10-CM | POA: Diagnosis not present

## 2015-10-13 NOTE — Telephone Encounter (Signed)
MA spoke with Loma Sousa and paperwork was faxed to [facility] instead of [facility]. Loma Sousa is resending the fax today to [facility].

## 2015-10-14 DIAGNOSIS — Z8679 Personal history of other diseases of the circulatory system: Secondary | ICD-10-CM | POA: Diagnosis not present

## 2015-10-14 DIAGNOSIS — R160 Hepatomegaly, not elsewhere classified: Secondary | ICD-10-CM | POA: Diagnosis not present

## 2015-10-14 DIAGNOSIS — D5700 Hb-SS disease with crisis, unspecified: Secondary | ICD-10-CM | POA: Diagnosis not present

## 2015-10-15 ENCOUNTER — Other Ambulatory Visit: Payer: Self-pay | Admitting: Family Medicine

## 2015-10-16 ENCOUNTER — Non-Acute Institutional Stay (HOSPITAL_COMMUNITY)
Admission: AD | Admit: 2015-10-16 | Discharge: 2015-10-16 | Disposition: A | Discharge status: Home / Self Care | Payer: Medicare Other | Source: Ambulatory Visit | Attending: Internal Medicine | Admitting: Internal Medicine

## 2015-10-16 ENCOUNTER — Other Ambulatory Visit: Payer: Self-pay | Admitting: Internal Medicine

## 2015-10-16 ENCOUNTER — Encounter (HOSPITAL_COMMUNITY): Payer: Self-pay | Admitting: *Deleted

## 2015-10-16 ENCOUNTER — Telehealth (HOSPITAL_COMMUNITY): Payer: Self-pay | Admitting: *Deleted

## 2015-10-16 DIAGNOSIS — Z86718 Personal history of other venous thrombosis and embolism: Secondary | ICD-10-CM | POA: Diagnosis not present

## 2015-10-16 DIAGNOSIS — I272 Other secondary pulmonary hypertension: Secondary | ICD-10-CM | POA: Diagnosis not present

## 2015-10-16 DIAGNOSIS — Z86711 Personal history of pulmonary embolism: Secondary | ICD-10-CM | POA: Insufficient documentation

## 2015-10-16 DIAGNOSIS — D5700 Hb-SS disease with crisis, unspecified: Secondary | ICD-10-CM

## 2015-10-16 DIAGNOSIS — Z9981 Dependence on supplemental oxygen: Secondary | ICD-10-CM | POA: Diagnosis not present

## 2015-10-16 DIAGNOSIS — Z87891 Personal history of nicotine dependence: Secondary | ICD-10-CM | POA: Insufficient documentation

## 2015-10-16 DIAGNOSIS — Z7982 Long term (current) use of aspirin: Secondary | ICD-10-CM | POA: Diagnosis not present

## 2015-10-16 DIAGNOSIS — Z79899 Other long term (current) drug therapy: Secondary | ICD-10-CM | POA: Diagnosis not present

## 2015-10-16 DIAGNOSIS — Z7901 Long term (current) use of anticoagulants: Secondary | ICD-10-CM | POA: Diagnosis not present

## 2015-10-16 DIAGNOSIS — J9610 Chronic respiratory failure, unspecified whether with hypoxia or hypercapnia: Secondary | ICD-10-CM | POA: Insufficient documentation

## 2015-10-16 DIAGNOSIS — Z79891 Long term (current) use of opiate analgesic: Secondary | ICD-10-CM | POA: Diagnosis not present

## 2015-10-16 DIAGNOSIS — I10 Essential (primary) hypertension: Secondary | ICD-10-CM | POA: Insufficient documentation

## 2015-10-16 DIAGNOSIS — Z96641 Presence of right artificial hip joint: Secondary | ICD-10-CM | POA: Diagnosis not present

## 2015-10-16 LAB — RETICULOCYTES
RBC.: 2.5 MIL/uL — ABNORMAL LOW (ref 4.22–5.81)
RETIC CT PCT: 2.7 % (ref 0.4–3.1)
Retic Count, Absolute: 67.5 10*3/uL (ref 19.0–186.0)

## 2015-10-16 LAB — CBC WITH DIFFERENTIAL/PLATELET
Basophils Absolute: 0.1 10*3/uL (ref 0.0–0.1)
Basophils Relative: 0 %
EOS ABS: 0.3 10*3/uL (ref 0.0–0.7)
Eosinophils Relative: 2 %
HEMATOCRIT: 22.3 % — AB (ref 39.0–52.0)
HEMOGLOBIN: 7.5 g/dL — AB (ref 13.0–17.0)
LYMPHS ABS: 1.6 10*3/uL (ref 0.7–4.0)
Lymphocytes Relative: 12 %
MCH: 30 pg (ref 26.0–34.0)
MCHC: 33.6 g/dL (ref 30.0–36.0)
MCV: 89.2 fL (ref 78.0–100.0)
MONOS PCT: 11 %
Monocytes Absolute: 1.5 10*3/uL — ABNORMAL HIGH (ref 0.1–1.0)
NEUTROS PCT: 75 %
Neutro Abs: 10.1 10*3/uL — ABNORMAL HIGH (ref 1.7–7.7)
Platelets: 476 10*3/uL — ABNORMAL HIGH (ref 150–400)
RBC: 2.5 MIL/uL — ABNORMAL LOW (ref 4.22–5.81)
RDW: 19.8 % — ABNORMAL HIGH (ref 11.5–15.5)
WBC: 13.6 10*3/uL — ABNORMAL HIGH (ref 4.0–10.5)

## 2015-10-16 MED ORDER — KETOROLAC TROMETHAMINE 30 MG/ML IJ SOLN
30.0000 mg | Freq: Four times a day (QID) | INTRAMUSCULAR | Status: DC
  Administered 2015-10-16: 30 mg via INTRAVENOUS
  Filled 2015-10-16: qty 1

## 2015-10-16 MED ORDER — HEPARIN SOD (PORK) LOCK FLUSH 100 UNIT/ML IV SOLN
500.0000 [IU] | INTRAVENOUS | Status: AC | PRN
  Administered 2015-10-16: 500 [IU]
  Filled 2015-10-16: qty 5

## 2015-10-16 MED ORDER — SODIUM CHLORIDE 0.9% FLUSH
10.0000 mL | INTRAVENOUS | Status: AC | PRN
  Administered 2015-10-16: 10 mL

## 2015-10-16 MED ORDER — ONDANSETRON HCL 4 MG/2ML IJ SOLN: 4.0000 mg | Freq: Four times a day (QID) | INTRAMUSCULAR | Status: DC | PRN

## 2015-10-16 MED ORDER — HYDROMORPHONE HCL 4 MG PO TABS: 4.0000 mg | ORAL_TABLET | ORAL | 0 refills | Status: DC | PRN

## 2015-10-16 MED ORDER — NALOXONE HCL 0.4 MG/ML IJ SOLN: 0.4000 mg | INTRAMUSCULAR | Status: DC | PRN

## 2015-10-16 MED ORDER — HYDROMORPHONE 1 MG/ML IV SOLN
INTRAVENOUS | Status: DC
  Administered 2015-10-16: 9.2 mg via INTRAVENOUS
  Administered 2015-10-16: 14.4 mg via INTRAVENOUS
  Administered 2015-10-16: 10:00:00 via INTRAVENOUS
  Filled 2015-10-16: qty 25

## 2015-10-16 MED ORDER — DIPHENHYDRAMINE HCL 25 MG PO CAPS: 25.0000 mg | ORAL_CAPSULE | ORAL | Status: DC | PRN

## 2015-10-16 MED ORDER — MORPHINE SULFATE 30 MG PO TABS: 30.0000 mg | ORAL_TABLET | Freq: Two times a day (BID) | ORAL | 0 refills | Status: DC

## 2015-10-16 MED ORDER — SODIUM CHLORIDE 0.9 % IV SOLN: 25.0000 mg | INTRAVENOUS | Status: DC | PRN

## 2015-10-16 MED ORDER — SODIUM CHLORIDE 0.9% FLUSH: 9.0000 mL | INTRAVENOUS | Status: DC | PRN

## 2015-10-16 MED ORDER — SENNOSIDES-DOCUSATE SODIUM 8.6-50 MG PO TABS
1.0000 | ORAL_TABLET | Freq: Two times a day (BID) | ORAL | Status: DC
  Administered 2015-10-16: 1 via ORAL
  Filled 2015-10-16 (×2): qty 1

## 2015-10-16 MED ORDER — DEXTROSE-NACL 5-0.45 % IV SOLN
INTRAVENOUS | Status: DC
  Administered 2015-10-16: 10:00:00 via INTRAVENOUS

## 2015-10-16 MED ORDER — POLYETHYLENE GLYCOL 3350 17 G PO PACK: 17.0000 g | PACK | Freq: Every day | ORAL | Status: DC | PRN

## 2015-10-16 NOTE — H&P (Signed)
[facility] History and Physical  [name] [id number] DOB: 05-02-1980 DOA: 10/16/2015  PCP: [name], MD   Chief Complaint: Body Pain  HPI: [name] is a 35 y.o. male with history of sickle cell disease, acute chest syndrome, DVT, PE on Xarelto, pulmonary hypertension, cor pulmonale, chronic respiratory failure on home oxygen, nonadherent with oxygen use and medications for SCD except the narcotics. He presented to the day hospital today complaining of pain in bilateral ribs and back. Rating pain intensity at 9/10. Patient denies chest pain, shortness of breath, difficulty breathing.Patient denies N/V/D and abdominal pain.Patient states he has taken his home pain medication without improvement for several days.  Systemic Review: General: The patient denies anorexia, fever, weight loss Cardiac: Denies chest pain, syncope, palpitations, pedal edema  Respiratory: Denies cough, shortness of breath, wheezing GI: Denies severe indigestion/heartburn, abdominal pain, nausea, vomiting, diarrhea and constipation GU: Denies hematuria, incontinence, dysuria  Musculoskeletal: Denies arthritis  Skin: Denies suspicious skin lesions Neurologic: Denies focal weakness or numbness, change in vision  Past Medical History:  Diagnosis Date  . Acute chest syndrome ([place]) 06/18/2013  . Acute embolism and thrombosis of right internal jugular vein ([place])   . Alcohol consumption of one to four drinks per day   . Avascular necrosis (HCC)    Right Hip  . Blood transfusion   . Chronic anticoagulation   . Demand ischemia ([place]) 01/02/2014  . Former smoker   . Functional asplenia   . Hb-SS disease with crisis ([place])   . History of Clostridium difficile infection   . History of pulmonary embolus (PE)   . Hypertension   . Hypokalemia   . Leukocytosis    Chronic  . Mood disorder ([place])   . Noncompliance with medication regimen   . Oxygen deficiency   . Pulmonary  hypertension ([place])   . Second hand tobacco smoke exposure   . Sickle cell anemia (HCC)   . Sickle-cell crisis with associated acute chest syndrome ([place]) 05/13/2013  . Thrombocytosis (HCC)    Chronic  . Uses marijuana     Past Surgical History:  Procedure Laterality Date  . CHOLECYSTECTOMY     01/2008  . Excision of left periauricular cyst     10/2009  . Excision of right ear lobe cyst with primary closur     11/2007  . Porta cath placement    . Porta cath removal    . PORTACATH PLACEMENT  01/05/2012   Procedure: INSERTION PORT-A-CATH;  Surgeon: Odis Hollingshead, MD;  Location: [place];  Service: General;  Laterality: N/A;  ultrasound guiced port a cath insertion with fluoroscopy  . Right hip replacement     08/2006  . UMBILICAL HERNIA REPAIR     01/2008    No Known Allergies  Family History  Problem Relation Age of Onset  . Sickle cell trait Mother   . Depression Mother   . Diabetes Mother   . Sickle cell trait Father   . Sickle cell trait Brother       Prior to Admission medications   Medication Sig Start Date End Date Taking? Authorizing Provider  ambrisentan (LETAIRIS) 5 MG tablet Take 5 mg by mouth at bedtime.    Yes Historical Provider, MD  aspirin 81 MG chewable tablet Chew 1 tablet (81 mg total) by mouth daily. 07/23/14  Yes [name], MD  Cholecalciferol (VITAMIN D) 2000 units tablet Take 1 tablet (2,000 Units total) by mouth daily. 02/23/15  Yes Dorena Dew, FNP  Deferasirox (JADENU) 360 MG TABS Take 1,080 mg by mouth daily.    Yes Historical Provider, MD  folic acid (FOLVITE) 1 MG tablet Take 1 tablet (1 mg total) by mouth every morning. 02/23/15  Yes Dorena Dew, FNP  gabapentin (NEURONTIN) 300 MG capsule Take 1 capsule (300 mg total) by mouth 3 (three) times daily. 06/01/15  Yes Tresa Garter, MD  HYDROmorphone (DILAUDID) 4 MG tablet Take 1 tablet (4 mg total) by mouth every 4 (four) hours as needed for severe pain. 10/09/15  Yes Micheline Chapman, NP  lisinopril (PRINIVIL,ZESTRIL) 5 MG tablet Take 1 tablet (5 mg total) by mouth daily. 08/04/15  Yes [name], MD  morphine (MSIR) 30 MG tablet Take 1 tablet (30 mg total) by mouth every 12 (twelve) hours. 10/09/15  Yes Micheline Chapman, NP  rivaroxaban (XARELTO) 20 MG TABS tablet Take 20 mg by mouth daily.    Yes Historical Provider, MD  torsemide (DEMADEX) 20 MG tablet Take 20 mg by mouth daily as needed. For increase in patient weight greater than 3 lbs. 07/27/15 07/26/16 Yes Historical Provider, MD  zolpidem (AMBIEN) 10 MG tablet Take 1 tablet (10 mg total) by mouth at bedtime as needed for sleep. 10/09/15  Yes Micheline Chapman, NP  potassium chloride SA (K-DUR,KLOR-CON) 20 MEQ tablet Take 1 tablet (20 mEq total) by mouth every morning. 04/13/15   Dorena Dew, FNP     Physical Exam: Vitals:   10/16/15 0854  BP: 125/76  Pulse: 73  Resp: 20  Temp: 98.4 F (36.9 C)  TempSrc: Oral  SpO2: 98%  Weight: 175 lb (79.4 kg)  Height: 6' (1.829 m)    General: Alert, awake, afebrile, anicteric, not in obvious distress HEENT: Normocephalic and Atraumatic, Mucous membranes pink                PERRLA; EOM intact; No scleral icterus,                 Nares: Patent, Oropharynx: Clear, Fair Dentition                 Neck: FROM, no cervical lymphadenopathy, thyromegaly, carotid bruit or JVD;  CHEST WALL: No tenderness  CHEST: Normal respiration, clear to auscultation bilaterally  HEART: Regular rate and rhythm; no murmurs rubs or gallops  BACK: No kyphosis or scoliosis; no CVA tenderness  ABDOMEN: Positive Bowel Sounds, soft, non-tender; no masses, no organomegaly EXTREMITIES: No cyanosis, clubbing, or edema SKIN:  no rash or ulceration  CNS: Alert and Oriented x 4, Nonfocal exam, CN 2-12 intact  Labs on Admission:  Basic Metabolic Panel: No results for input(s): NA, K, CL, CO2, GLUCOSE, BUN, CREATININE, CALCIUM, MG, PHOS in the last 168 hours. Liver Function Tests: No  results for input(s): AST, ALT, ALKPHOS, BILITOT, PROT, ALBUMIN in the last 168 hours. No results for input(s): LIPASE, AMYLASE in the last 168 hours. No results for input(s): AMMONIA in the last 168 hours. CBC: No results for input(s): WBC, NEUTROABS, HGB, HCT, MCV, PLT in the last 168 hours. Cardiac Enzymes: No results for input(s): CKTOTAL, CKMB, CKMBINDEX, TROPONINI in the last 168 hours.  BNP (last 3 results)  Recent Labs  11/30/14 0910 08/02/15 2307  BNP 639.7* 503.2*    ProBNP (last 3 results) No results for input(s): PROBNP in the last 8760 hours.  CBG: No results for input(s): GLUCAP in the last 168 hours.   Assessment/Plan Active Problems:  Sickle cell anemia with pain ([place])   Admits to the Day Hospital  IVF D5 .45% Saline @ 125 mls/hour  Weight based Dilaudid PCA started within 30 minutes of admission  IV Toradol 30 mg Q 6 H  Monitor vitals very closely, Re-evaluate pain scale every hour  2 L of Oxygen by [place]  Patient will be re-evaluated for pain in the context of function and relationship to baseline as care progresses.  If no significant relieve from pain (remains above 5/10) will transfer patient to inpatient services for further evaluation and management  Code Status: Full  Family Communication: None  DVT Prophylaxis: Ambulate as tolerated   Time spent: 6 Minutes  Myrka Sylva, MD, MHA, FACP, FAAP, CPE  If 7PM-7AM, please contact night-coverage [email or web address] 10/16/2015, 9:20 AM

## 2015-10-16 NOTE — Progress Notes (Signed)
Patient ID: [name], male   DOB: Mar 26, 1980, 35 y.o.   MRN: [id number] Admitted to [facility] for 8/10 rib and back pain. Treated with IV fluids, IV Toradol and PCA Dilaudid. Pain level down to 4/10 at time for discharge. Went over discharge instructions and copy given to patient with understanding. Alert, oriented and ambulatory at time of discharge. Discharged to home with ride.

## 2015-10-16 NOTE — Telephone Encounter (Signed)
Called patient back after speaking with Dr. Doreene Burke and told patient he could come in for treatment. Patient stated he could be here in 45 minutes to an hour.

## 2015-10-16 NOTE — Telephone Encounter (Signed)
Patient called requesting to come to the [facility] for treatment. Patient denies fever, chest pain, N/V/D, and Priapism. Patient is complaining  Of side, back, and leg pain. Patient rates pain 8/10 on pain scale. Patient last took MS contin and Dilaudid on this morning. Will check with provider and call patient back. Patient voiced understanding.

## 2015-10-16 NOTE — Discharge Summary (Signed)
Physician Discharge Summary  [name] [id number] DOB: 04-13-1980 DOA: 10/16/2015  PCP: Gerald Chessman, MD  Admit date: 10/16/2015  Discharge date: 10/16/2015  Time spent: 30 minutes  Discharge Diagnoses:  Active Problems:   Sickle cell anemia with pain [facility])  Discharge Condition: Stable  Diet recommendation: Regular  Filed Weights   10/16/15 0854  Weight: 175 lb (79.4 kg)   History of present illness:  [name] is a 35 y.o. male with history of sickle cell disease, acute chest syndrome, DVT, PE on Xarelto, pulmonary hypertension, cor pulmonale, chronic respiratory failure on home oxygen, nonadherent with oxygen use and medications for SCD except the narcotics. He presented to the day hospital today complaining of pain in bilateral ribs and back. Rating pain intensity at 9/10. Patient denies chest pain, shortness of breath, difficulty breathing.Patient denies N/V/D and abdominal pain.Patient states he has taken his home pain medication without improvement for several days.  Hospital Course:  [name] was admitted to the day hospital with sickle cell painful crisis. Patient was treated with weight based IV Dilaudid PCA, IV Toradol as well as IV fluids. Regionald showed significant improvement symptomatically, pain improved from 9 to 5/10 at the time of discharge. Patient was discharged home is a hemodynamically stable condition. [name] will follow-up at the clinic as previously scheduled, continue with home medications as per prior to admission.  Discharge Instructions We discussed the need for good hydration, monitoring of hydration status, avoidance of heat, cold, stress, and infection triggers. We discussed the need to be compliant with taking Hydrea. [name] was reminded of the need to seek medical attention of any symptoms of bleeding, anemia, or infection occurs.  Discharge Exam: Vitals:   10/16/15 1305 10/16/15 1444  BP: (!) 104/52 103/67   Pulse: 73 80  Resp: 15 16  Temp:     General appearance: alert, cooperative and no distress Eyes: conjunctivae/corneas clear. PERRL, EOM's intact. Fundi benign. Neck: no adenopathy, no carotid bruit, no JVD, supple, symmetrical, trachea midline and thyroid not enlarged, symmetric, no tenderness/mass/nodules Back: symmetric, no curvature. ROM normal. No CVA tenderness. Resp: clear to auscultation bilaterally Chest wall: no tenderness Cardio: regular rate and rhythm, S1, S2 normal, no murmur, click, rub or gallop GI: soft, non-tender; bowel sounds normal; no masses, no organomegaly Extremities: extremities normal, atraumatic, no cyanosis or edema Pulses: 2+ and symmetric Skin: Skin color, texture, turgor normal. No rashes or lesions Neurologic: Grossly normal  Discharge Instructions    Diet - low sodium heart healthy    Complete by:  As directed    Increase activity slowly    Complete by:  As directed      Current Discharge Medication List    CONTINUE these medications which have NOT CHANGED   Details  ambrisentan (LETAIRIS) 5 MG tablet Take 5 mg by mouth at bedtime.     aspirin 81 MG chewable tablet Chew 1 tablet (81 mg total) by mouth daily. Qty: 30 tablet, Refills: 11    Cholecalciferol (VITAMIN D) 2000 units tablet Take 1 tablet (2,000 Units total) by mouth daily. Qty: 90 tablet, Refills: 3   Associated Diagnoses: Vitamin D deficiency    Deferasirox (JADENU) 360 MG TABS Take 1,080 mg by mouth daily.     folic acid (FOLVITE) 1 MG tablet Take 1 tablet (1 mg total) by mouth every morning. Qty: 30 tablet, Refills: 11   Associated Diagnoses: Hb-SS disease without crisis ([place])    gabapentin (NEURONTIN) 300 MG capsule Take 1  capsule (300 mg total) by mouth 3 (three) times daily. Qty: 270 capsule, Refills: 3    HYDROmorphone (DILAUDID) 4 MG tablet Take 1 tablet (4 mg total) by mouth every 4 (four) hours as needed for severe pain. Qty: 12 tablet, Refills: 0    lisinopril  (PRINIVIL,ZESTRIL) 5 MG tablet Take 1 tablet (5 mg total) by mouth daily. Qty: 30 tablet, Refills: 1    morphine (MSIR) 30 MG tablet Take 1 tablet (30 mg total) by mouth every 12 (twelve) hours. Qty: 6 tablet, Refills: 0    rivaroxaban (XARELTO) 20 MG TABS tablet Take 20 mg by mouth daily.     torsemide (DEMADEX) 20 MG tablet Take 20 mg by mouth daily as needed. For increase in patient weight greater than 3 lbs.    zolpidem (AMBIEN) 10 MG tablet Take 1 tablet (10 mg total) by mouth at bedtime as needed for sleep. Qty: 30 tablet, Refills: 0   Associated Diagnoses: Insomnia    potassium chloride SA (K-DUR,KLOR-CON) 20 MEQ tablet Take 1 tablet (20 mEq total) by mouth every morning. Qty: 30 tablet, Refills: 3   Associated Diagnoses: Pulmonary HTN ([place])       No Known Allergies   Significant Diagnostic Studies: Dg Chest 2 View  Result Date: 09/26/2015 CLINICAL DATA:  Bilateral rib pain radiating to back for 2 days. History of hypertension and sickle cell. EXAM: CHEST  2 VIEW COMPARISON:  Chest radiograph September 23, 2015 FINDINGS: The cardiac silhouette is moderately enlarged and unchanged. Mediastinal silhouette is nonsuspicious. Stable mild bibasilar strandy densities, unchanged. No pleural effusion or focal consolidation. No pneumothorax. Single lumen LEFT chest Port-A-Cath with distal tip projecting cavoatrial junction. No pneumothorax. Soft tissue planes included osseous structures are nonsuspicious. Surgical clips in the included right abdomen compatible with cholecystectomy. IMPRESSION: Stable cardiomegaly.  No acute pulmonary process. Electronically Signed   By: Elon Alas M.D.   On: 09/26/2015 04:21   Dg Chest 2 View  Result Date: 09/23/2015 CLINICAL DATA:  Fever today EXAM: CHEST  2 VIEW COMPARISON:  09/18/2015 FINDINGS: Left chest wall port is again seen and stable. Cardiac shadow remains enlarged. The lungs are well aerated bilaterally. Diffuse interstitial changes are  again seen stable from the prior study. No focal infiltrate is seen. IMPRESSION: No acute abnormality noted. Electronically Signed   By: Inez Catalina M.D.   On: 09/23/2015 13:04   Dg Chest 2 View  Result Date: 09/18/2015 CLINICAL DATA:  Status post fall, with upper anterior chest pain. Initial encounter. EXAM: CHEST  2 VIEW COMPARISON:  Chest radiograph performed 09/01/2015 FINDINGS: The lungs are well-aerated. Mild vascular congestion is noted, with mild bibasilar atelectasis. There is no evidence of pleural effusion or pneumothorax. The heart is enlarged. A left-sided chest port ends about the cavoatrial junction. No acute osseous abnormalities are seen. IMPRESSION: Mild vascular congestion and cardiomegaly noted. Mild bibasilar atelectasis seen. No displaced rib fractures identified. Electronically Signed   By: Garald Balding M.D.   On: 09/18/2015 02:17   Dg Lumbar Spine Complete  Result Date: 09/18/2015 CLINICAL DATA:  Status post fall, with lower back pain radiating to both hips. Initial encounter. EXAM: LUMBAR SPINE - COMPLETE 4+ VIEW COMPARISON:  CT of the abdomen and pelvis from 06/03/2015 FINDINGS: There is no evidence of fracture or subluxation. Vertebral bodies demonstrate normal height and alignment. Intervertebral disc spaces are preserved. The visualized neural foramina are grossly unremarkable in appearance. A right hip arthroplasty is only partially imaged but appears grossly unremarkable.  The visualized bowel gas pattern is unremarkable in appearance; air and stool are noted within the colon. The sacroiliac joints are within normal limits. Clips are noted within the right upper quadrant, reflecting prior cholecystectomy. IMPRESSION: No evidence of fracture or subluxation along the lumbar spine. Electronically Signed   By: Garald Balding M.D.   On: 09/18/2015 02:19   Dg Hip Unilat W Or Wo Pelvis 2-3 Views Right  Result Date: 09/18/2015 CLINICAL DATA:  Status post fall, landing on right  side, with right hip pain. Initial encounter. EXAM: DG HIP (WITH OR WITHOUT PELVIS) 2-3V RIGHT COMPARISON:  None. FINDINGS: There is no evidence of fracture or dislocation. The patient's right hip arthroplasty is grossly unremarkable in appearance, without evidence of loosening. Underlying screws are noted along the right acetabulum. Both femoral heads are seated normally within their respective acetabula. The proximal right femur appears intact. No significant degenerative change is appreciated. The sacroiliac joints are unremarkable in appearance. The visualized bowel gas pattern is grossly unremarkable in appearance. IMPRESSION: No evidence of fracture or dislocation. Right hip arthroplasty is grossly intact, without evidence of loosening. Electronically Signed   By: Garald Balding M.D.   On: 09/18/2015 02:16    Signed:  [name] MD, [name], [place], [place], CPE   10/16/2015, 4:42 PM

## 2015-10-16 NOTE — Discharge Instructions (Signed)
Sickle Cell Anemia, Adult Sickle cell anemia is a condition in which red blood cells have an abnormal "sickle" shape. This abnormal shape shortens the cells' life span, which results in a lower than normal concentration of red blood cells in the blood. The sickle shape also causes the cells to clump together and block free blood flow through the blood vessels. As a result, the tissues and organs of the body do not receive enough oxygen. Sickle cell anemia causes organ damage and pain and increases the risk of infection. CAUSES  Sickle cell anemia is a genetic disorder. Those who receive two copies of the gene have the condition, and those who receive one copy have the trait. RISK FACTORS The sickle cell gene is most common in people whose families originated in [place]. Other areas of the globe where sickle cell trait occurs include the [place], [place] and [place], the [place], and the [place].  SIGNS AND SYMPTOMS  Pain, especially in the extremities, back, chest, or abdomen (common). The pain may start suddenly or may develop following an illness, especially if there is dehydration. Pain can also occur due to overexertion or exposure to extreme temperature changes.  Frequent severe bacterial infections, especially certain types of pneumonia and meningitis.  Pain and swelling in the hands and feet.  Decreased activity.   Loss of appetite.   Change in behavior.  Headaches.  Seizures.  Shortness of breath or difficulty breathing.  Vision changes.  Skin ulcers. Those with the trait may not have symptoms or they may have mild symptoms.  DIAGNOSIS  Sickle cell anemia is diagnosed with blood tests that demonstrate the genetic trait. It is often diagnosed during the newborn period, due to mandatory testing nationwide. A variety of blood tests, X-rays, CT scans, MRI scans, ultrasounds, and lung function tests may also be done to monitor the condition. TREATMENT  Sickle  cell anemia may be treated with:  Medicines. You may be given pain medicines, antibiotic medicines (to treat and prevent infections) or medicines to increase the production of certain types of hemoglobin.  Fluids.  Oxygen.  Blood transfusions. HOME CARE INSTRUCTIONS   Drink enough fluid to keep your urine clear or pale yellow. Increase your fluid intake in hot weather and during exercise.  Do not smoke. Smoking lowers oxygen levels in the blood.   Only take over-the-counter or prescription medicines for pain, fever, or discomfort as directed by your health care provider.  Take antibiotics as directed by your health care provider. Make sure you finish them it even if you start to feel better.   Take supplements as directed by your health care provider.   Consider wearing a medical alert bracelet. This tells anyone caring for you in an emergency of your condition.   When traveling, keep your medical information, health care provider's names, and the medicines you take with you at all times.   If you develop a fever, do not take medicines to reduce the fever right away. This could cover up a problem that is developing. Notify your health care provider.  Keep all follow-up appointments with your health care provider. Sickle cell anemia requires regular medical care. SEEK MEDICAL CARE IF: You have a fever. SEEK IMMEDIATE MEDICAL CARE IF:   You feel dizzy or faint.   You have new abdominal pain, especially on the left side near the stomach area.   You develop a persistent, often uncomfortable and painful penile erection (priapism). If this is not treated immediately it   will lead to impotence.   You have numbness your arms or legs or you have a hard time moving them.   You have a hard time with speech.   You have a fever or persistent symptoms for more than 2-3 days.   You have a fever and your symptoms suddenly get worse.   You have signs or symptoms of infection.  These include:   Chills.   Abnormal tiredness (lethargy).   Irritability.   Poor eating.   Vomiting.   You develop pain that is not helped with medicine.   You develop shortness of breath.  You have pain in your chest.   You are coughing up pus-like or bloody sputum.   You develop a stiff neck.  Your feet or hands swell or have pain.  Your abdomen appears bloated.  You develop joint pain. MAKE SURE YOU:  Understand these instructions.   This information is not intended to replace advice given to you by your health care provider. Make sure you discuss any questions you have with your health care provider.   Document Released: 04/27/2005 Document Revised: 02/07/2014 Document Reviewed: 08/29/2012 Elsevier Interactive Patient Education 2016 [employer]  

## 2015-10-21 ENCOUNTER — Telehealth (HOSPITAL_COMMUNITY): Payer: Self-pay | Admitting: Hematology

## 2015-10-21 ENCOUNTER — Non-Acute Institutional Stay (HOSPITAL_COMMUNITY)
Admission: AD | Admit: 2015-10-21 | Discharge: 2015-10-21 | Disposition: A | Discharge status: Home / Self Care | Payer: Medicare Other | Source: Ambulatory Visit | Attending: Internal Medicine | Admitting: Internal Medicine

## 2015-10-21 ENCOUNTER — Encounter (HOSPITAL_COMMUNITY): Payer: Self-pay | Admitting: Hematology

## 2015-10-21 DIAGNOSIS — Z96641 Presence of right artificial hip joint: Secondary | ICD-10-CM | POA: Diagnosis not present

## 2015-10-21 DIAGNOSIS — Z9114 Patient's other noncompliance with medication regimen: Secondary | ICD-10-CM | POA: Insufficient documentation

## 2015-10-21 DIAGNOSIS — I272 Other secondary pulmonary hypertension: Secondary | ICD-10-CM | POA: Diagnosis not present

## 2015-10-21 DIAGNOSIS — I10 Essential (primary) hypertension: Secondary | ICD-10-CM | POA: Insufficient documentation

## 2015-10-21 DIAGNOSIS — Z79891 Long term (current) use of opiate analgesic: Secondary | ICD-10-CM | POA: Insufficient documentation

## 2015-10-21 DIAGNOSIS — Z87891 Personal history of nicotine dependence: Secondary | ICD-10-CM | POA: Diagnosis not present

## 2015-10-21 DIAGNOSIS — Z7901 Long term (current) use of anticoagulants: Secondary | ICD-10-CM | POA: Diagnosis not present

## 2015-10-21 DIAGNOSIS — Z9981 Dependence on supplemental oxygen: Secondary | ICD-10-CM | POA: Insufficient documentation

## 2015-10-21 DIAGNOSIS — Z86711 Personal history of pulmonary embolism: Secondary | ICD-10-CM | POA: Insufficient documentation

## 2015-10-21 DIAGNOSIS — Z86718 Personal history of other venous thrombosis and embolism: Secondary | ICD-10-CM | POA: Insufficient documentation

## 2015-10-21 DIAGNOSIS — Z7982 Long term (current) use of aspirin: Secondary | ICD-10-CM | POA: Insufficient documentation

## 2015-10-21 DIAGNOSIS — Z9119 Patient's noncompliance with other medical treatment and regimen: Secondary | ICD-10-CM | POA: Insufficient documentation

## 2015-10-21 DIAGNOSIS — D5700 Hb-SS disease with crisis, unspecified: Secondary | ICD-10-CM | POA: Diagnosis not present

## 2015-10-21 DIAGNOSIS — J9610 Chronic respiratory failure, unspecified whether with hypoxia or hypercapnia: Secondary | ICD-10-CM | POA: Diagnosis not present

## 2015-10-21 DIAGNOSIS — Z79899 Other long term (current) drug therapy: Secondary | ICD-10-CM | POA: Diagnosis not present

## 2015-10-21 LAB — CBC WITH DIFFERENTIAL/PLATELET
BASOS ABS: 0.1 10*3/uL (ref 0.0–0.1)
BASOS PCT: 1 %
Eosinophils Absolute: 0.2 10*3/uL (ref 0.0–0.7)
Eosinophils Relative: 1 %
HEMATOCRIT: 25.6 % — AB (ref 39.0–52.0)
Hemoglobin: 8.5 g/dL — ABNORMAL LOW (ref 13.0–17.0)
Lymphocytes Relative: 12 %
Lymphs Abs: 2.2 10*3/uL (ref 0.7–4.0)
MCH: 30.5 pg (ref 26.0–34.0)
MCHC: 33.2 g/dL (ref 30.0–36.0)
MCV: 91.8 fL (ref 78.0–100.0)
MONO ABS: 2.2 10*3/uL — AB (ref 0.1–1.0)
Monocytes Relative: 12 %
NEUTROS ABS: 13.9 10*3/uL — AB (ref 1.7–7.7)
NEUTROS PCT: 74 %
Platelets: 581 10*3/uL — ABNORMAL HIGH (ref 150–400)
RBC: 2.79 MIL/uL — ABNORMAL LOW (ref 4.22–5.81)
RDW: 20.3 % — AB (ref 11.5–15.5)
WBC: 18.6 10*3/uL — ABNORMAL HIGH (ref 4.0–10.5)

## 2015-10-21 MED ORDER — NALOXONE HCL 0.4 MG/ML IJ SOLN: 0.4000 mg | INTRAMUSCULAR | Status: DC | PRN

## 2015-10-21 MED ORDER — DIPHENHYDRAMINE HCL 25 MG PO CAPS
25.0000 mg | ORAL_CAPSULE | ORAL | Status: DC | PRN
  Administered 2015-10-21: 50 mg via ORAL
  Filled 2015-10-21: qty 2

## 2015-10-21 MED ORDER — SODIUM CHLORIDE 0.9 % IV SOLN
25.0000 mg | INTRAVENOUS | Status: DC | PRN
  Filled 2015-10-21: qty 0.5

## 2015-10-21 MED ORDER — SENNOSIDES-DOCUSATE SODIUM 8.6-50 MG PO TABS: 1.0000 | ORAL_TABLET | Freq: Two times a day (BID) | ORAL | Status: DC

## 2015-10-21 MED ORDER — KETOROLAC TROMETHAMINE 30 MG/ML IJ SOLN
30.0000 mg | Freq: Four times a day (QID) | INTRAMUSCULAR | Status: DC
  Administered 2015-10-21: 30 mg via INTRAVENOUS
  Filled 2015-10-21: qty 1

## 2015-10-21 MED ORDER — POLYETHYLENE GLYCOL 3350 17 G PO PACK: 17.0000 g | PACK | Freq: Every day | ORAL | Status: DC | PRN

## 2015-10-21 MED ORDER — SODIUM CHLORIDE 0.9% FLUSH
10.0000 mL | INTRAVENOUS | Status: AC | PRN
  Administered 2015-10-21: 10 mL

## 2015-10-21 MED ORDER — HEPARIN SOD (PORK) LOCK FLUSH 100 UNIT/ML IV SOLN
500.0000 [IU] | INTRAVENOUS | Status: AC | PRN
  Administered 2015-10-21: 500 [IU]
  Filled 2015-10-21: qty 5

## 2015-10-21 MED ORDER — HYDROMORPHONE 1 MG/ML IV SOLN
INTRAVENOUS | Status: DC
  Administered 2015-10-21: 11:00:00 via INTRAVENOUS
  Administered 2015-10-21: 13.4 mg via INTRAVENOUS
  Administered 2015-10-21: 4.2 mg via INTRAVENOUS
  Filled 2015-10-21: qty 25

## 2015-10-21 MED ORDER — DEXTROSE-NACL 5-0.45 % IV SOLN
INTRAVENOUS | Status: DC
  Administered 2015-10-21: 11:00:00 via INTRAVENOUS

## 2015-10-21 MED ORDER — ONDANSETRON HCL 4 MG/2ML IJ SOLN: 4.0000 mg | Freq: Four times a day (QID) | INTRAMUSCULAR | Status: DC | PRN

## 2015-10-21 MED ORDER — SODIUM CHLORIDE 0.9% FLUSH: 9.0000 mL | INTRAVENOUS | Status: DC | PRN

## 2015-10-21 NOTE — Progress Notes (Signed)
Admitted to [facility] for 8/10 complaints of pain in ribs and back. Treated with IV Fluids, IV Toradol, PCA Dilaudid. Pain level down to 6/10 at time for discharge. Went over discharge instructions and copy given to patient. Patient agrees to use his home oxygen and his incentive spirometry at home. RA ox 94 % at time of discharge. Alert, oriented and ambulatory at time of discharge. Discharged home with ride.

## 2015-10-21 NOTE — Discharge Summary (Signed)
Physician Discharge Summary  [name] [id number] DOB: 06-12-1980 DOA: 10/21/2015  PCP: [name], MD  Admit date: 10/21/2015  Discharge date: 10/21/2015  Time spent: 30 minutes  Discharge Diagnoses:  Active Problems:   Sickle cell anemia with pain [facility])   Discharge Condition: Stable  Diet recommendation: Regular  History of present illness:  [name] is a 36 y.o. male with history of sickle cell disease, acute chest syndrome, DVT, PE on Xarelto, pulmonary hypertension, cor pulmonale, chronic respiratory failure on home oxygen, nonadherent with oxygen use and medications for SCD except the narcotics. He presented to the day hospital today complaining of pain in bilateral ribs and lower back. Rating pain intensity at 9/10.Patient denies chest pain, shortness of breath, difficulty breathing.Patient denies N/V/D and abdominal pain.Patient states he has taken his home pain medication without improvement. He was last seen here on Sept 15. For the same complaint.   Hospital Course:  [name] was admitted to the day hospital with sickle cell painful crisis. Patient was treated with weight based IV Dilaudid PCA, IV Toradol as well as IV fluids. [name] showed significant improvement symptomatically, pain improved from 9 to 5/10 at the time of discharge. Patient was discharged home is a hemodynamically stable condition. [name] will follow-up at the clinic as previously scheduled, continue with home medications as per prior to admission.  Discharge Instructions We discussed the need for good hydration, monitoring of hydration status, avoidance of heat, cold, stress, and infection triggers. We discussed the need to be compliant with taking Hydrea. [place]or was reminded of the need to seek medical attention of any symptoms of bleeding, anemia, or infection occurs.  Discharge Exam: Vitals:   10/21/15 1415 10/21/15 1619  BP: 119/64   Pulse: 81   Resp: 19 (!)  26  Temp: 98.3 F (36.8 C)    General appearance: alert, cooperative and no distress Eyes: conjunctivae/corneas clear. PERRL, EOM's intact. Fundi benign. Neck: no adenopathy, no carotid bruit, no JVD, supple, symmetrical, trachea midline and thyroid not enlarged, symmetric, no tenderness/mass/nodules Back: symmetric, no curvature. ROM normal. No CVA tenderness. Resp: clear to auscultation bilaterally Chest wall: no tenderness Cardio: regular rate and rhythm, S1, S2 normal, no murmur, click, rub or gallop GI: soft, non-tender; bowel sounds normal; no masses, no organomegaly Extremities: extremities normal, atraumatic, no cyanosis or edema Pulses: 2+ and symmetric Skin: Skin color, texture, turgor normal. No rashes or lesions Neurologic: Grossly normal   Discharge Medication List as of 10/21/2015  3:03 PM    CONTINUE these medications which have NOT CHANGED   Details  ambrisentan (LETAIRIS) 5 MG tablet Take 5 mg by mouth at bedtime. , Historical Med    aspirin 81 MG chewable tablet Chew 1 tablet (81 mg total) by mouth daily., Starting 07/23/2014, Until Discontinued, Normal    Cholecalciferol (VITAMIN D) 2000 units tablet Take 1 tablet (2,000 Units total) by mouth daily., Starting 02/23/2015, Until Discontinued, Normal    Deferasirox (JADENU) 360 MG TABS Take 1,080 mg by mouth daily. , Historical Med    folic acid (FOLVITE) 1 MG tablet Take 1 tablet (1 mg total) by mouth every morning., Starting 02/23/2015, Until Discontinued, Normal    gabapentin (NEURONTIN) 300 MG capsule Take 1 capsule (300 mg total) by mouth 3 (three) times daily., Starting 06/01/2015, Until Discontinued, Print    HYDROmorphone (DILAUDID) 4 MG tablet Take 1 tablet (4 mg total) by mouth every 4 (four) hours as needed for severe pain., Starting Fri 10/16/2015, Print  lisinopril (PRINIVIL,ZESTRIL) 5 MG tablet Take 1 tablet (5 mg total) by mouth daily., Starting Tue 08/04/2015, Normal    morphine (MSIR) 30 MG tablet Take  1 tablet (30 mg total) by mouth every 12 (twelve) hours., Starting Fri 10/16/2015, Print    potassium chloride SA (K-DUR,KLOR-CON) 20 MEQ tablet Take 1 tablet (20 mEq total) by mouth every morning., Starting 04/13/2015, Until Discontinued, Normal    rivaroxaban (XARELTO) 20 MG TABS tablet Take 20 mg by mouth daily. , Historical Med    torsemide (DEMADEX) 20 MG tablet Take 20 mg by mouth daily as needed. For increase in patient weight greater than 3 lbs., Starting Mon 07/27/2015, Until Tue 07/26/2016, Historical Med    zolpidem (AMBIEN) 10 MG tablet Take 1 tablet (10 mg total) by mouth at bedtime as needed for sleep., Starting Fri 10/09/2015, Print       No Known Allergies   Significant Diagnostic Studies: Dg Chest 2 View  Result Date: 09/26/2015 CLINICAL DATA:  Bilateral rib pain radiating to back for 2 days. History of hypertension and sickle cell. EXAM: CHEST  2 VIEW COMPARISON:  Chest radiograph September 23, 2015 FINDINGS: The cardiac silhouette is moderately enlarged and unchanged. Mediastinal silhouette is nonsuspicious. Stable mild bibasilar strandy densities, unchanged. No pleural effusion or focal consolidation. No pneumothorax. Single lumen LEFT chest Port-A-Cath with distal tip projecting cavoatrial junction. No pneumothorax. Soft tissue planes included osseous structures are nonsuspicious. Surgical clips in the included right abdomen compatible with cholecystectomy. IMPRESSION: Stable cardiomegaly.  No acute pulmonary process. Electronically Signed   By: Elon Alas M.D.   On: 09/26/2015 04:21   Dg Chest 2 View  Result Date: 09/23/2015 CLINICAL DATA:  Fever today EXAM: CHEST  2 VIEW COMPARISON:  09/18/2015 FINDINGS: Left chest wall port is again seen and stable. Cardiac shadow remains enlarged. The lungs are well aerated bilaterally. Diffuse interstitial changes are again seen stable from the prior study. No focal infiltrate is seen. IMPRESSION: No acute abnormality noted.  Electronically Signed   By: Inez Catalina M.D.   On: 09/23/2015 13:04    Signed:  [name] MD, [place], [place], [place], CPE   10/21/2015, 6:10 PM

## 2015-10-21 NOTE — Discharge Instructions (Signed)

## 2015-10-21 NOTE — H&P (Signed)
[facility] History and Physical  [name] [id number] DOB: 1981/01/13 DOA: 10/21/2015  PCP: [name], MD   Chief Complaint:  Chief Complaint  Patient presents with  . Sickle Cell Pain Crisis   HPI: [name] is a 35 y.o. male with history of sickle cell disease, acute chest syndrome, DVT, PE on Xarelto, pulmonary hypertension, cor pulmonale, chronic respiratory failure on home oxygen, nonadherent with oxygen use and medications for SCD except the narcotics. He presented to the day hospital today complaining of pain in bilateral ribs and lower back. Rating pain intensity at 9/10.Patient denies chest pain, shortness of breath, difficulty breathing.Patient denies N/V/D and abdominal pain.Patient states he has taken his home pain medication without improvement. He was last seen here on Sept 15. For the same complaint.    Systemic Review: General: The patient denies anorexia, fever, weight loss Cardiac: Denies chest pain, syncope, palpitations, pedal edema  Respiratory: Denies cough, shortness of breath, wheezing GI: Denies severe indigestion/heartburn, abdominal pain, nausea, vomiting, diarrhea and constipation GU: Denies hematuria, incontinence, dysuria  Musculoskeletal: Denies arthritis  Skin: Denies suspicious skin lesions Neurologic: Denies focal weakness or numbness, change in vision  Past Medical History:  Diagnosis Date  . Acute chest syndrome ([place]) 06/18/2013  . Acute embolism and thrombosis of right internal jugular vein ([place])   . Alcohol consumption of one to four drinks per day   . Avascular necrosis (HCC)    Right Hip  . Blood transfusion   . Chronic anticoagulation   . Demand ischemia ([place]) 01/02/2014  . Former smoker   . Functional asplenia   . Hb-SS disease with crisis ([place])   . History of Clostridium difficile infection   . History of pulmonary embolus (PE)   . Hypertension   . Hypokalemia   . Leukocytosis    Chronic   . Mood disorder ([place])   . Noncompliance with medication regimen   . Oxygen deficiency   . Pulmonary hypertension ([place])   . Second hand tobacco smoke exposure   . Sickle cell anemia (HCC)   . Sickle-cell crisis with associated acute chest syndrome ([place]) 05/13/2013  . Thrombocytosis (HCC)    Chronic  . Uses marijuana     Past Surgical History:  Procedure Laterality Date  . CHOLECYSTECTOMY     01/2008  . Excision of left periauricular cyst     10/2009  . Excision of right ear lobe cyst with primary closur     11/2007  . Porta cath placement    . Porta cath removal    . PORTACATH PLACEMENT  01/05/2012   Procedure: INSERTION PORT-A-CATH;  Surgeon: Odis Hollingshead, MD;  Location: [place];  Service: General;  Laterality: N/A;  ultrasound guiced port a cath insertion with fluoroscopy  . Right hip replacement     08/2006  . UMBILICAL HERNIA REPAIR     01/2008    No Known Allergies  Family History  Problem Relation Age of Onset  . Sickle cell trait Mother   . Depression Mother   . Diabetes Mother   . Sickle cell trait Father   . Sickle cell trait Brother       Prior to Admission medications   Medication Sig Start Date End Date Taking? Authorizing Provider  ambrisentan (LETAIRIS) 5 MG tablet Take 5 mg by mouth at bedtime.    Yes Historical Provider, MD  aspirin 81 MG chewable tablet Chew 1 tablet (81 mg total) by mouth daily. 07/23/14  Yes [name], MD  Cholecalciferol (VITAMIN D) 2000 units tablet Take 1 tablet (2,000 Units total) by mouth daily. 02/23/15  Yes Dorena Dew, FNP  Deferasirox (JADENU) 360 MG TABS Take 1,080 mg by mouth daily.    Yes Historical Provider, MD  folic acid (FOLVITE) 1 MG tablet Take 1 tablet (1 mg total) by mouth every morning. 02/23/15  Yes Dorena Dew, FNP  gabapentin (NEURONTIN) 300 MG capsule Take 1 capsule (300 mg total) by mouth 3 (three) times daily. 06/01/15  Yes Tresa Garter, MD  HYDROmorphone (DILAUDID) 4 MG tablet Take  1 tablet (4 mg total) by mouth every 4 (four) hours as needed for severe pain. 10/16/15  Yes Tresa Garter, MD  lisinopril (PRINIVIL,ZESTRIL) 5 MG tablet Take 1 tablet (5 mg total) by mouth daily. 08/04/15  Yes [name], MD  morphine (MSIR) 30 MG tablet Take 1 tablet (30 mg total) by mouth every 12 (twelve) hours. 10/16/15  Yes Tresa Garter, MD  potassium chloride SA (K-DUR,KLOR-CON) 20 MEQ tablet Take 1 tablet (20 mEq total) by mouth every morning. 04/13/15  Yes Dorena Dew, FNP  rivaroxaban (XARELTO) 20 MG TABS tablet Take 20 mg by mouth daily.    Yes Historical Provider, MD  torsemide (DEMADEX) 20 MG tablet Take 20 mg by mouth daily as needed. For increase in patient weight greater than 3 lbs. 07/27/15 07/26/16 Yes Historical Provider, MD  zolpidem (AMBIEN) 10 MG tablet Take 1 tablet (10 mg total) by mouth at bedtime as needed for sleep. 10/09/15  Yes Micheline Chapman, NP   Physical Exam: Vitals:   10/21/15 0936  BP: 102/68  Pulse: 79  Resp: 20  Temp: 98.8 F (37.1 C)  TempSrc: Oral  SpO2: 93%    General: Alert, awake, afebrile, anicteric, not in obvious distress HEENT: Normocephalic and Atraumatic, Mucous membranes pink                PERRLA; EOM intact; No scleral icterus,                 Nares: Patent, Oropharynx: Clear, Fair Dentition                 Neck: FROM, no cervical lymphadenopathy, thyromegaly, carotid bruit or JVD;  CHEST WALL: No tenderness  CHEST: Normal respiration, clear to auscultation bilaterally  HEART: Regular rate and rhythm; no murmurs rubs or gallops  BACK: No kyphosis or scoliosis; no CVA tenderness  ABDOMEN: Positive Bowel Sounds, soft, non-tender; no masses, no organomegaly EXTREMITIES: No cyanosis, clubbing, or edema SKIN:  no rash or ulceration  CNS: Alert and Oriented x 4, Nonfocal exam, CN 2-12 intact  Labs on Admission:  Basic Metabolic Panel: No results for input(s): NA, K, CL, CO2, GLUCOSE, BUN, CREATININE, CALCIUM, MG,  PHOS in the last 168 hours. Liver Function Tests: No results for input(s): AST, ALT, ALKPHOS, BILITOT, PROT, ALBUMIN in the last 168 hours. No results for input(s): LIPASE, AMYLASE in the last 168 hours. No results for input(s): AMMONIA in the last 168 hours. CBC:  Recent Labs Lab 10/16/15 0920  WBC 13.6*  NEUTROABS 10.1*  HGB 7.5*  HCT 22.3*  MCV 89.2  PLT 476*   Cardiac Enzymes: No results for input(s): CKTOTAL, CKMB, CKMBINDEX, TROPONINI in the last 168 hours.  BNP (last 3 results)  Recent Labs  11/30/14 0910 08/02/15 2307  BNP 639.7* 503.2*    ProBNP (last 3 results) No results for input(s): PROBNP in  the last 8760 hours.  CBG: No results for input(s): GLUCAP in the last 168 hours.   Assessment/Plan Active Problems:   Sickle cell anemia with pain ([place])   Admits to the Day Hospital  IVF D5 .45% Saline @ 125 mls/hour  Weight based Dilaudid PCA started within 30 minutes of admission  IV Toradol 30 mg Q 6 H  Monitor vitals very closely, Re-evaluate pain scale every hour  2 L of Oxygen by [place]  Patient will be re-evaluated for pain in the context of function and relationship to baseline as care progresses.  If no significant relieve from pain (remains above 5/10) will transfer patient to inpatient services for further evaluation and management  Code Status: Full  Family Communication: None  DVT Prophylaxis: Ambulate as tolerated   Time spent: 36 Minutes  Kashari Chalmers, MD, MHA, FACP, FAAP, CPE  If 7PM-7AM, please contact night-coverage [email or web address] 10/21/2015, 10:09 AM

## 2015-10-21 NOTE — Telephone Encounter (Signed)
Spoke with Dr. Doreene Burke, called patient and advised he can come to [facility].  Patient verbalized understanding.

## 2015-10-21 NOTE — Telephone Encounter (Signed)
Patient C/O pain to bilateral ribs, back.  Patient rates pain 8/10 on pain scale.  Patient denies chest pain or shortness of breath, N/V/D, abdominal pain or priapism.  Patient states home pain medication is not working.  I advised I would notify the provider and give him a call back.

## 2015-10-23 ENCOUNTER — Telehealth (HOSPITAL_COMMUNITY): Payer: Self-pay | Admitting: *Deleted

## 2015-10-23 ENCOUNTER — Non-Acute Institutional Stay (HOSPITAL_COMMUNITY): Payer: Medicare Other

## 2015-10-23 ENCOUNTER — Encounter (HOSPITAL_COMMUNITY): Payer: Self-pay | Admitting: *Deleted

## 2015-10-23 ENCOUNTER — Non-Acute Institutional Stay (HOSPITAL_COMMUNITY)
Admission: AD | Admit: 2015-10-23 | Discharge: 2015-10-23 | Disposition: A | Discharge status: Home / Self Care | Payer: Medicare Other | Source: Ambulatory Visit | Attending: Internal Medicine | Admitting: Internal Medicine

## 2015-10-23 DIAGNOSIS — Z86711 Personal history of pulmonary embolism: Secondary | ICD-10-CM | POA: Diagnosis not present

## 2015-10-23 DIAGNOSIS — D5700 Hb-SS disease with crisis, unspecified: Secondary | ICD-10-CM | POA: Insufficient documentation

## 2015-10-23 DIAGNOSIS — Z7982 Long term (current) use of aspirin: Secondary | ICD-10-CM | POA: Diagnosis not present

## 2015-10-23 DIAGNOSIS — Z86718 Personal history of other venous thrombosis and embolism: Secondary | ICD-10-CM | POA: Insufficient documentation

## 2015-10-23 DIAGNOSIS — R0682 Tachypnea, not elsewhere classified: Secondary | ICD-10-CM

## 2015-10-23 DIAGNOSIS — R0902 Hypoxemia: Secondary | ICD-10-CM | POA: Diagnosis not present

## 2015-10-23 DIAGNOSIS — Z79899 Other long term (current) drug therapy: Secondary | ICD-10-CM | POA: Diagnosis not present

## 2015-10-23 DIAGNOSIS — Z9981 Dependence on supplemental oxygen: Secondary | ICD-10-CM | POA: Insufficient documentation

## 2015-10-23 DIAGNOSIS — Z87891 Personal history of nicotine dependence: Secondary | ICD-10-CM | POA: Diagnosis not present

## 2015-10-23 DIAGNOSIS — R7981 Abnormal blood-gas level: Secondary | ICD-10-CM

## 2015-10-23 DIAGNOSIS — J9610 Chronic respiratory failure, unspecified whether with hypoxia or hypercapnia: Secondary | ICD-10-CM | POA: Diagnosis not present

## 2015-10-23 DIAGNOSIS — Z7901 Long term (current) use of anticoagulants: Secondary | ICD-10-CM | POA: Insufficient documentation

## 2015-10-23 DIAGNOSIS — I10 Essential (primary) hypertension: Secondary | ICD-10-CM | POA: Diagnosis not present

## 2015-10-23 DIAGNOSIS — Z96641 Presence of right artificial hip joint: Secondary | ICD-10-CM | POA: Insufficient documentation

## 2015-10-23 DIAGNOSIS — R52 Pain, unspecified: Secondary | ICD-10-CM | POA: Diagnosis present

## 2015-10-23 MED ORDER — SODIUM CHLORIDE 0.9 % IV SOLN: 25.0000 mg | INTRAVENOUS | Status: DC | PRN

## 2015-10-23 MED ORDER — SENNOSIDES-DOCUSATE SODIUM 8.6-50 MG PO TABS: 1.0000 | ORAL_TABLET | Freq: Two times a day (BID) | ORAL | Status: DC

## 2015-10-23 MED ORDER — HEPARIN SOD (PORK) LOCK FLUSH 100 UNIT/ML IV SOLN
500.0000 [IU] | INTRAVENOUS | Status: AC | PRN
  Administered 2015-10-23: 500 [IU]
  Filled 2015-10-23: qty 5

## 2015-10-23 MED ORDER — HYDROMORPHONE 1 MG/ML IV SOLN
INTRAVENOUS | Status: DC
  Administered 2015-10-23: 9.4 mg via INTRAVENOUS
  Administered 2015-10-23: 7 mg via INTRAVENOUS
  Administered 2015-10-23: 11:00:00 via INTRAVENOUS
  Filled 2015-10-23: qty 25

## 2015-10-23 MED ORDER — NALOXONE HCL 0.4 MG/ML IJ SOLN: 0.4000 mg | INTRAMUSCULAR | Status: DC | PRN

## 2015-10-23 MED ORDER — DEXTROSE-NACL 5-0.45 % IV SOLN
INTRAVENOUS | Status: DC
  Administered 2015-10-23: 11:00:00 via INTRAVENOUS

## 2015-10-23 MED ORDER — SODIUM CHLORIDE 0.9% FLUSH: 9.0000 mL | INTRAVENOUS | Status: DC | PRN

## 2015-10-23 MED ORDER — DIPHENHYDRAMINE HCL 25 MG PO CAPS
25.0000 mg | ORAL_CAPSULE | ORAL | Status: DC | PRN
Start: 2015-10-23 — End: 2015-10-23

## 2015-10-23 MED ORDER — ONDANSETRON HCL 4 MG/2ML IJ SOLN: 4.0000 mg | Freq: Four times a day (QID) | INTRAMUSCULAR | Status: DC | PRN

## 2015-10-23 MED ORDER — POLYETHYLENE GLYCOL 3350 17 G PO PACK: 17.0000 g | PACK | Freq: Every day | ORAL | Status: DC | PRN

## 2015-10-23 MED ORDER — KETOROLAC TROMETHAMINE 30 MG/ML IJ SOLN
30.0000 mg | Freq: Four times a day (QID) | INTRAMUSCULAR | Status: DC
  Administered 2015-10-23: 30 mg via INTRAVENOUS
  Filled 2015-10-23: qty 1

## 2015-10-23 MED ORDER — SODIUM CHLORIDE 0.9% FLUSH
10.0000 mL | INTRAVENOUS | Status: AC | PRN
  Administered 2015-10-23: 10 mL

## 2015-10-23 NOTE — Progress Notes (Signed)
Tachypnea noted shortly after admission and oximetry 94% on admission and oxygen started shortly after PCA started. MD notified. CXR ordered.

## 2015-10-23 NOTE — Discharge Summary (Signed)
Physician Discharge Summary  [name] [id number] DOB: 1980-10-10 DOA: 10/23/2015  PCP: [name], MD  Admit date: 10/23/2015  Discharge date: 10/23/2015  Time spent: 30 minutes  Discharge Diagnoses:  Active Problems:   Sickle cell anemia with pain [facility])  Discharge Condition: Stable  Diet recommendation: Regular  Filed Weights   10/23/15 1047  Weight: 175 lb (79.4 kg)   History of present illness:  [name] is a 35 y.o. male with history of sickle cell disease, acute chest syndrome, DVT, PE on Xarelto, pulmonary hypertension, cor pulmonale, chronic respiratory failure on home oxygen. He presented to the day hospital today complaining of pain in bilateral ribs and lower back. Rating his pain intensity at 9/10. He was seen here and treated for the same complaint 2 days ago.Patient denies chest pain, shortness of breath, difficulty breathing.Patient denies N/V/D and abdominal pain.Patient states he has taken his home pain medication without improvement.   Hospital Course:  [name] was admitted to the day hospital with sickle cell painful crisis. Patient was treated with weight based IV Dilaudid PCA, IV Toradol as well as IV fluids. [name] showed significant improvement symptomatically, pain improved from 9 to 5/10 at the time of discharge. CXR was done, no evidence of acute disease. Patient was discharged home is a hemodynamically stable condition. [name] will follow-up at the clinic as previously scheduled, continue with home medications as per prior to admission.  Discharge Instructions We discussed the need for good hydration, monitoring of hydration status, avoidance of heat, cold, stress, and infection triggers. We discussed the need to be compliant with taking Hydrea. [name] was reminded of the need to seek medical attention of any symptoms of bleeding, anemia, or infection occurs.  Discharge Exam: Vitals:   10/23/15 1348 10/23/15 1500   BP: 123/77 136/88  Pulse: 90 85  Resp: (!) 25 (!) 22  Temp:      General appearance: alert, cooperative and no distress Eyes: conjunctivae/corneas clear. PERRL, EOM's intact. Fundi benign. Neck: no adenopathy, no carotid bruit, no JVD, supple, symmetrical, trachea midline and thyroid not enlarged, symmetric, no tenderness/mass/nodules Back: symmetric, no curvature. ROM normal. No CVA tenderness. Resp: clear to auscultation bilaterally Chest wall: no tenderness Cardio: regular rate and rhythm, S1, S2 normal, no murmur, click, rub or gallop GI: soft, non-tender; bowel sounds normal; no masses, no organomegaly Extremities: extremities normal, atraumatic, no cyanosis or edema Pulses: 2+ and symmetric Skin: Skin color, texture, turgor normal. No rashes or lesions Neurologic: Grossly normal   Current Discharge Medication List    CONTINUE these medications which have NOT CHANGED   Details  ambrisentan (LETAIRIS) 5 MG tablet Take 5 mg by mouth at bedtime.     aspirin 81 MG chewable tablet Chew 1 tablet (81 mg total) by mouth daily. Qty: 30 tablet, Refills: 11    Cholecalciferol (VITAMIN D) 2000 units tablet Take 1 tablet (2,000 Units total) by mouth daily. Qty: 90 tablet, Refills: 3   Associated Diagnoses: Vitamin D deficiency    Deferasirox (JADENU) 360 MG TABS Take 1,080 mg by mouth daily.     folic acid (FOLVITE) 1 MG tablet Take 1 tablet (1 mg total) by mouth every morning. Qty: 30 tablet, Refills: 11   Associated Diagnoses: Hb-SS disease without crisis ([place])    gabapentin (NEURONTIN) 300 MG capsule Take 1 capsule (300 mg total) by mouth 3 (three) times daily. Qty: 270 capsule, Refills: 3    HYDROmorphone (DILAUDID) 4 MG tablet Take 1 tablet (  4 mg total) by mouth every 4 (four) hours as needed for severe pain. Qty: 90 tablet, Refills: 0    lisinopril (PRINIVIL,ZESTRIL) 5 MG tablet Take 1 tablet (5 mg total) by mouth daily. Qty: 30 tablet, Refills: 1    morphine (MSIR)  30 MG tablet Take 1 tablet (30 mg total) by mouth every 12 (twelve) hours. Qty: 60 tablet, Refills: 0    rivaroxaban (XARELTO) 20 MG TABS tablet Take 20 mg by mouth daily.     zolpidem (AMBIEN) 10 MG tablet Take 1 tablet (10 mg total) by mouth at bedtime as needed for sleep. Qty: 30 tablet, Refills: 0   Associated Diagnoses: Insomnia    potassium chloride SA (K-DUR,KLOR-CON) 20 MEQ tablet Take 1 tablet (20 mEq total) by mouth every morning. Qty: 30 tablet, Refills: 3   Associated Diagnoses: Pulmonary HTN (HCC)    torsemide (DEMADEX) 20 MG tablet Take 20 mg by mouth daily as needed. For increase in patient weight greater than 3 lbs.       No Known Allergies   Significant Diagnostic Studies: Dg Chest 2 View  Result Date: 10/23/2015 CLINICAL DATA:  Tachypnea and hypoxia EXAM: CHEST  2 VIEW COMPARISON:  09/26/2015 FINDINGS: Chronic cardiomegaly. Porta catheter on the left with tip at the upper right atrium. Chronic interstitial coarsening with small scattered scar-like opacities. There is no edema, consolidation, effusion, or pneumothorax. Bilateral humeral head avascular necrosis. IMPRESSION: Stable from prior.  No evidence of acute disease. Electronically Signed   By: Monte Fantasia M.D.   On: 10/23/2015 14:27   Dg Chest 2 View  Result Date: 09/26/2015 CLINICAL DATA:  Bilateral rib pain radiating to back for 2 days. History of hypertension and sickle cell. EXAM: CHEST  2 VIEW COMPARISON:  Chest radiograph September 23, 2015 FINDINGS: The cardiac silhouette is moderately enlarged and unchanged. Mediastinal silhouette is nonsuspicious. Stable mild bibasilar strandy densities, unchanged. No pleural effusion or focal consolidation. No pneumothorax. Single lumen LEFT chest Port-A-Cath with distal tip projecting cavoatrial junction. No pneumothorax. Soft tissue planes included osseous structures are nonsuspicious. Surgical clips in the included right abdomen compatible with cholecystectomy.  IMPRESSION: Stable cardiomegaly.  No acute pulmonary process. Electronically Signed   By: Elon Alas M.D.   On: 09/26/2015 04:21    Signed:  [name] MD, MHA, FACP, [place], CPE   10/23/2015, 4:12 PM

## 2015-10-23 NOTE — Telephone Encounter (Signed)
Called in complaining of pain in back and ribs. Pain intensity of 9/10. Denies chest pain, N/V, fever, abdominal pain or priapism. Took Dilaudid, MS Contin at 6 am without relief.

## 2015-10-23 NOTE — Discharge Instructions (Signed)

## 2015-10-23 NOTE — H&P (Signed)
[facility] History and Physical  [name] [id number] DOB: 06-27-1980 DOA: 10/23/2015  PCP: [name], MD   Chief Complaint: Pain  HPI: [name] is a 35 y.o. male with history of sickle cell disease, acute chest syndrome, DVT, PE on Xarelto, pulmonary hypertension, cor pulmonale, chronic respiratory failure on home oxygen. He presented to the day hospital today complaining of pain in bilateral ribs and lower back. Rating his pain intensity at 9/10. He was seen here and treated for the same complaint 2 days ago.Patient denies chest pain, shortness of breath, difficulty breathing.Patient denies N/V/D and abdominal pain.Patient states he has taken his home pain medication without improvement.   Systemic Review: General: The patient denies anorexia, fever, weight loss Cardiac: Denies chest pain, syncope, palpitations, pedal edema  Respiratory: Denies cough, shortness of breath, wheezing GI: Denies severe indigestion/heartburn, abdominal pain, nausea, vomiting, diarrhea and constipation GU: Denies hematuria, incontinence, dysuria  Musculoskeletal: Denies arthritis  Skin: Denies suspicious skin lesions Neurologic: Denies focal weakness or numbness, change in vision  Past Medical History:  Diagnosis Date  . Acute chest syndrome ([place]) 06/18/2013  . Acute embolism and thrombosis of right internal jugular vein ([place])   . Alcohol consumption of one to four drinks per day   . Avascular necrosis (HCC)    Right Hip  . Blood transfusion   . Chronic anticoagulation   . Demand ischemia ([place]) 01/02/2014  . Former smoker   . Functional asplenia   . Hb-SS disease with crisis ([place])   . History of Clostridium difficile infection   . History of pulmonary embolus (PE)   . Hypertension   . Hypokalemia   . Leukocytosis    Chronic  . Mood disorder ([place])   . Noncompliance with medication regimen   . Oxygen deficiency   . Pulmonary hypertension ([place])   .  Second hand tobacco smoke exposure   . Sickle cell anemia (HCC)   . Sickle-cell crisis with associated acute chest syndrome ([place]) 05/13/2013  . Thrombocytosis (HCC)    Chronic  . Uses marijuana     Past Surgical History:  Procedure Laterality Date  . CHOLECYSTECTOMY     01/2008  . Excision of left periauricular cyst     10/2009  . Excision of right ear lobe cyst with primary closur     11/2007  . Porta cath placement    . Porta cath removal    . PORTACATH PLACEMENT  01/05/2012   Procedure: INSERTION PORT-A-CATH;  Surgeon: Odis Hollingshead, MD;  Location: [place];  Service: General;  Laterality: N/A;  ultrasound guiced port a cath insertion with fluoroscopy  . Right hip replacement     08/2006  . UMBILICAL HERNIA REPAIR     01/2008    No Known Allergies  Family History  Problem Relation Age of Onset  . Sickle cell trait Mother   . Depression Mother   . Diabetes Mother   . Sickle cell trait Father   . Sickle cell trait Brother       Prior to Admission medications   Medication Sig Start Date End Date Taking? Authorizing Provider  ambrisentan (LETAIRIS) 5 MG tablet Take 5 mg by mouth at bedtime.    Yes Historical Provider, MD  aspirin 81 MG chewable tablet Chew 1 tablet (81 mg total) by mouth daily. 07/23/14  Yes [name], MD  Cholecalciferol (VITAMIN D) 2000 units tablet Take 1 tablet (2,000 Units total) by mouth daily. 02/23/15  Yes Dorena Dew, FNP  Deferasirox (JADENU) 360 MG TABS Take 1,080 mg by mouth daily.    Yes Historical Provider, MD  folic acid (FOLVITE) 1 MG tablet Take 1 tablet (1 mg total) by mouth every morning. 02/23/15  Yes Dorena Dew, FNP  gabapentin (NEURONTIN) 300 MG capsule Take 1 capsule (300 mg total) by mouth 3 (three) times daily. 06/01/15  Yes Tresa Garter, MD  HYDROmorphone (DILAUDID) 4 MG tablet Take 1 tablet (4 mg total) by mouth every 4 (four) hours as needed for severe pain. 10/16/15  Yes Tresa Garter, MD  lisinopril  (PRINIVIL,ZESTRIL) 5 MG tablet Take 1 tablet (5 mg total) by mouth daily. 08/04/15  Yes [name], MD  morphine (MSIR) 30 MG tablet Take 1 tablet (30 mg total) by mouth every 12 (twelve) hours. 10/16/15  Yes Tresa Garter, MD  rivaroxaban (XARELTO) 20 MG TABS tablet Take 20 mg by mouth daily.    Yes Historical Provider, MD  zolpidem (AMBIEN) 10 MG tablet Take 1 tablet (10 mg total) by mouth at bedtime as needed for sleep. 10/09/15  Yes Micheline Chapman, NP  potassium chloride SA (K-DUR,KLOR-CON) 20 MEQ tablet Take 1 tablet (20 mEq total) by mouth every morning. 04/13/15   Dorena Dew, FNP  torsemide (DEMADEX) 20 MG tablet Take 20 mg by mouth daily as needed. For increase in patient weight greater than 3 lbs. 07/27/15 07/26/16  Historical Provider, MD     Physical Exam: Vitals:   10/23/15 1047  BP: 119/80  Pulse: 92  Resp: 20  Temp: 98.8 F (37.1 C)  TempSrc: Oral  SpO2: 93%  Weight: 175 lb (79.4 kg)  Height: 6' (1.829 m)    General: Alert, awake, afebrile, anicteric, not in obvious distress HEENT: Normocephalic and Atraumatic, Mucous membranes pink                PERRLA; EOM intact; No scleral icterus,                 Nares: Patent, Oropharynx: Clear, Fair Dentition                 Neck: FROM, no cervical lymphadenopathy, thyromegaly, carotid bruit or JVD;  CHEST WALL: No tenderness  CHEST: Normal respiration, clear to auscultation bilaterally  HEART: Regular rate and rhythm; no murmurs rubs or gallops  BACK: No kyphosis or scoliosis; no CVA tenderness  ABDOMEN: Positive Bowel Sounds, soft, non-tender; no masses, no organomegaly EXTREMITIES: No cyanosis, clubbing, or edema SKIN:  no rash or ulceration  CNS: Alert and Oriented x 4, Nonfocal exam, CN 2-12 intact  Labs on Admission:  Basic Metabolic Panel: No results for input(s): NA, K, CL, CO2, GLUCOSE, BUN, CREATININE, CALCIUM, MG, PHOS in the last 168 hours. Liver Function Tests: No results for input(s): AST,  ALT, ALKPHOS, BILITOT, PROT, ALBUMIN in the last 168 hours. No results for input(s): LIPASE, AMYLASE in the last 168 hours. No results for input(s): AMMONIA in the last 168 hours. CBC:  Recent Labs Lab 10/21/15 1144  WBC 18.6*  NEUTROABS 13.9*  HGB 8.5*  HCT 25.6*  MCV 91.8  PLT 581*   Cardiac Enzymes: No results for input(s): CKTOTAL, CKMB, CKMBINDEX, TROPONINI in the last 168 hours.  BNP (last 3 results)  Recent Labs  11/30/14 0910 08/02/15 2307  BNP 639.7* 503.2*    ProBNP (last 3 results) No results for input(s): PROBNP in the last 8760 hours.  CBG: No results for input(s):  GLUCAP in the last 168 hours.   Assessment/Plan Active Problems:   Sickle cell anemia with pain ([place])   Admits to the Day Hospital  IVF D5 .45% Saline @ 125 mls/hour  Weight based Dilaudid PCA started within 30 minutes of admission  IV Toradol 30 mg Q 6 H  Monitor vitals very closely, Re-evaluate pain scale every hour  2 L of Oxygen by [place]  Patient will be re-evaluated for pain in the context of function and relationship to baseline as care progresses.  If no significant relieve from pain (remains above 5/10) will transfer patient to inpatient services for further evaluation and management  Code Status: Full  Family Communication: None  DVT Prophylaxis: Ambulate as tolerated   Time spent: 22 Minutes  Amjad Fikes, MD, MHA, FACP, FAAP, CPE  If 7PM-7AM, please contact night-coverage [email or web address] 10/23/2015, 11:19 AM

## 2015-10-23 NOTE — Progress Notes (Signed)
Admitted to [facility] for 9/10 complaints of pain in ribs and back. Treated with IV Fluids, IV Toradol, PCA Dilaudid. Pain level down to 5/10 at time for discharge. Went over discharge instructions and copy given to patient. Patient agrees to use his home oxygen and his incentive spirometry at home. RA ox 91 % at time of discharge. Alert, oriented and ambulatory at time of discharge. Discharged home with ride.

## 2015-10-23 NOTE — Telephone Encounter (Signed)
Advised from Dr. Doreene Burke that patient can come in for treatment. Patient aware of limited time for treatment today because of time and he agrees that he wants to come in to the [facility] for treatment.

## 2015-10-27 ENCOUNTER — Encounter (HOSPITAL_COMMUNITY): Payer: Self-pay | Admitting: Emergency Medicine

## 2015-10-27 ENCOUNTER — Emergency Department (HOSPITAL_COMMUNITY): Payer: Medicare Other

## 2015-10-27 ENCOUNTER — Inpatient Hospital Stay (HOSPITAL_COMMUNITY)
Admission: EM | Admit: 2015-10-27 | Discharge: 2015-11-03 | DRG: 812 | Disposition: A | Discharge status: Home / Self Care | Payer: Medicare Other | Attending: Internal Medicine | Admitting: Internal Medicine

## 2015-10-27 ENCOUNTER — Other Ambulatory Visit: Payer: Self-pay | Admitting: Internal Medicine

## 2015-10-27 DIAGNOSIS — D5700 Hb-SS disease with crisis, unspecified: Secondary | ICD-10-CM | POA: Diagnosis present

## 2015-10-27 DIAGNOSIS — Z9119 Patient's noncompliance with other medical treatment and regimen: Secondary | ICD-10-CM

## 2015-10-27 DIAGNOSIS — R109 Unspecified abdominal pain: Secondary | ICD-10-CM | POA: Diagnosis not present

## 2015-10-27 DIAGNOSIS — Z23 Encounter for immunization: Secondary | ICD-10-CM

## 2015-10-27 DIAGNOSIS — Z7901 Long term (current) use of anticoagulants: Secondary | ICD-10-CM | POA: Diagnosis not present

## 2015-10-27 DIAGNOSIS — D72829 Elevated white blood cell count, unspecified: Secondary | ICD-10-CM | POA: Diagnosis present

## 2015-10-27 DIAGNOSIS — I272 Other secondary pulmonary hypertension: Secondary | ICD-10-CM

## 2015-10-27 DIAGNOSIS — Z86718 Personal history of other venous thrombosis and embolism: Secondary | ICD-10-CM

## 2015-10-27 DIAGNOSIS — Z87891 Personal history of nicotine dependence: Secondary | ICD-10-CM | POA: Diagnosis not present

## 2015-10-27 DIAGNOSIS — J9601 Acute respiratory failure with hypoxia: Secondary | ICD-10-CM | POA: Diagnosis not present

## 2015-10-27 DIAGNOSIS — R079 Chest pain, unspecified: Secondary | ICD-10-CM | POA: Diagnosis not present

## 2015-10-27 DIAGNOSIS — R0902 Hypoxemia: Secondary | ICD-10-CM

## 2015-10-27 DIAGNOSIS — D638 Anemia in other chronic diseases classified elsewhere: Secondary | ICD-10-CM | POA: Diagnosis not present

## 2015-10-27 DIAGNOSIS — I158 Other secondary hypertension: Secondary | ICD-10-CM | POA: Diagnosis present

## 2015-10-27 DIAGNOSIS — I10 Essential (primary) hypertension: Secondary | ICD-10-CM | POA: Diagnosis present

## 2015-10-27 DIAGNOSIS — Z96641 Presence of right artificial hip joint: Secondary | ICD-10-CM | POA: Diagnosis present

## 2015-10-27 DIAGNOSIS — D57219 Sickle-cell/Hb-C disease with crisis, unspecified: Secondary | ICD-10-CM | POA: Diagnosis not present

## 2015-10-27 DIAGNOSIS — G894 Chronic pain syndrome: Secondary | ICD-10-CM | POA: Diagnosis present

## 2015-10-27 LAB — CBC WITH DIFFERENTIAL/PLATELET
BASOS ABS: 0.1 10*3/uL (ref 0.0–0.1)
BASOS PCT: 1 %
EOS ABS: 0.4 10*3/uL (ref 0.0–0.7)
EOS PCT: 3 %
HCT: 22.1 % — ABNORMAL LOW (ref 39.0–52.0)
HEMOGLOBIN: 7.6 g/dL — AB (ref 13.0–17.0)
Lymphocytes Relative: 14 %
Lymphs Abs: 1.8 10*3/uL (ref 0.7–4.0)
MCH: 30.2 pg (ref 26.0–34.0)
MCHC: 34.4 g/dL (ref 30.0–36.0)
MCV: 87.7 fL (ref 78.0–100.0)
Monocytes Absolute: 1.9 10*3/uL — ABNORMAL HIGH (ref 0.1–1.0)
Monocytes Relative: 15 %
NEUTROS PCT: 67 %
Neutro Abs: 8.5 10*3/uL — ABNORMAL HIGH (ref 1.7–7.7)
PLATELETS: 490 10*3/uL — AB (ref 150–400)
RBC: 2.52 MIL/uL — AB (ref 4.22–5.81)
RDW: 19.8 % — ABNORMAL HIGH (ref 11.5–15.5)
WBC: 12.7 10*3/uL — AB (ref 4.0–10.5)

## 2015-10-27 LAB — COMPREHENSIVE METABOLIC PANEL
ALT: 20 U/L (ref 17–63)
AST: 30 U/L (ref 15–41)
Albumin: 4.2 g/dL (ref 3.5–5.0)
Alkaline Phosphatase: 81 U/L (ref 38–126)
Anion gap: 7 (ref 5–15)
BUN: 14 mg/dL (ref 6–20)
CO2: 20 mmol/L — ABNORMAL LOW (ref 22–32)
CREATININE: 0.76 mg/dL (ref 0.61–1.24)
Calcium: 8.7 mg/dL — ABNORMAL LOW (ref 8.9–10.3)
Chloride: 112 mmol/L — ABNORMAL HIGH (ref 101–111)
GFR calc non Af Amer: >60 mL/min (ref >60)
Glucose, Bld: 100 mg/dL — ABNORMAL HIGH (ref 65–99)
Potassium: 3.4 mmol/L — ABNORMAL LOW (ref 3.5–5.1)
SODIUM: 139 mmol/L (ref 135–145)
TOTAL PROTEIN: 7.3 g/dL (ref 6.5–8.1)
Total Bilirubin: 6.1 mg/dL — ABNORMAL HIGH (ref 0.3–1.2)

## 2015-10-27 LAB — D-DIMER, QUANTITATIVE (NOT AT ARMC): D DIMER QUANT: 0.27 ug{FEU}/mL (ref 0.00–0.50)

## 2015-10-27 MED ORDER — SODIUM CHLORIDE 0.45 % IV SOLN
INTRAVENOUS | Status: DC
  Administered 2015-10-27: 125 mL/h via INTRAVENOUS
  Administered 2015-10-27: 1000 mL via INTRAVENOUS
  Administered 2015-10-28 (×2): via INTRAVENOUS
  Administered 2015-10-28: 125 mL/h via INTRAVENOUS
  Administered 2015-10-29 – 2015-10-31 (×2): via INTRAVENOUS

## 2015-10-27 MED ORDER — HYDROMORPHONE HCL 2 MG/ML IJ SOLN
2.0000 mg | INTRAMUSCULAR | Status: AC | PRN
Start: 2015-10-27 — End: 2015-10-27
  Administered 2015-10-27 (×3): 2 mg via INTRAVENOUS
  Filled 2015-10-27 (×3): qty 1

## 2015-10-27 MED ORDER — MORPHINE SULFATE ER 30 MG PO TBCR
30.0000 mg | EXTENDED_RELEASE_TABLET | Freq: Two times a day (BID) | ORAL | Status: DC
  Administered 2015-10-27 – 2015-11-03 (×15): 30 mg via ORAL
  Filled 2015-10-27 (×15): qty 1

## 2015-10-27 MED ORDER — SODIUM CHLORIDE 0.9% FLUSH: 9.0000 mL | INTRAVENOUS | Status: DC | PRN

## 2015-10-27 MED ORDER — FOLIC ACID 1 MG PO TABS
1.0000 mg | ORAL_TABLET | Freq: Every morning | ORAL | Status: DC
  Administered 2015-10-27 – 2015-11-03 (×8): 1 mg via ORAL
  Filled 2015-10-27 (×8): qty 1

## 2015-10-27 MED ORDER — ONDANSETRON HCL 4 MG/2ML IJ SOLN
4.0000 mg | INTRAMUSCULAR | Status: DC | PRN
  Administered 2015-10-27: 4 mg via INTRAVENOUS
  Filled 2015-10-27: qty 2

## 2015-10-27 MED ORDER — INFLUENZA VAC SPLIT QUAD 0.5 ML IM SUSY
0.5000 mL | PREFILLED_SYRINGE | INTRAMUSCULAR | Status: AC
  Administered 2015-10-28: 0.5 mL via INTRAMUSCULAR
  Filled 2015-10-27: qty 0.5

## 2015-10-27 MED ORDER — POLYETHYLENE GLYCOL 3350 17 G PO PACK: 17.0000 g | PACK | Freq: Every day | ORAL | Status: DC | PRN

## 2015-10-27 MED ORDER — AMBRISENTAN 5 MG PO TABS
5.0000 mg | ORAL_TABLET | Freq: Every day | ORAL | Status: DC
  Administered 2015-10-27 – 2015-11-02 (×7): 5 mg via ORAL

## 2015-10-27 MED ORDER — KETOROLAC TROMETHAMINE 30 MG/ML IJ SOLN
30.0000 mg | Freq: Four times a day (QID) | INTRAMUSCULAR | Status: AC
  Administered 2015-10-27 – 2015-11-01 (×19): 30 mg via INTRAVENOUS
  Filled 2015-10-27 (×19): qty 1

## 2015-10-27 MED ORDER — ZOLPIDEM TARTRATE 5 MG PO TABS
5.0000 mg | ORAL_TABLET | Freq: Every evening | ORAL | Status: DC | PRN
  Administered 2015-10-27 – 2015-11-02 (×7): 5 mg via ORAL
  Filled 2015-10-27 (×7): qty 1

## 2015-10-27 MED ORDER — DEFERASIROX 360 MG PO TABS
1080.0000 mg | ORAL_TABLET | Freq: Every day | ORAL | Status: DC
  Administered 2015-10-28 – 2015-10-31 (×4): 1080 mg via ORAL

## 2015-10-27 MED ORDER — HYDROMORPHONE 1 MG/ML IV SOLN
INTRAVENOUS | Status: DC
  Administered 2015-10-27: 25 mL via INTRAVENOUS
  Administered 2015-10-27: 9 mg via INTRAVENOUS
  Administered 2015-10-27: 1.8 mg via INTRAVENOUS
  Administered 2015-10-28: 4.2 mg via INTRAVENOUS
  Administered 2015-10-28: 3 mg via INTRAVENOUS
  Administered 2015-10-28 (×2): 4.2 mg via INTRAVENOUS
  Administered 2015-10-28: 2.8 mg via INTRAVENOUS
  Administered 2015-10-28: 4.2 mg via INTRAVENOUS
  Administered 2015-10-29: 6.6 mg via INTRAVENOUS
  Administered 2015-10-29: 4.8 mg via INTRAVENOUS
  Administered 2015-10-29: 25 mg via INTRAVENOUS
  Administered 2015-10-29: 6 mg via INTRAVENOUS
  Administered 2015-10-29 (×2): 5.4 mg via INTRAVENOUS
  Administered 2015-10-29: 3.6 mg via INTRAVENOUS
  Administered 2015-10-29: 03:00:00 via INTRAVENOUS
  Administered 2015-10-30: 4.8 mg via INTRAVENOUS
  Administered 2015-10-30: 9 mg via INTRAVENOUS
  Administered 2015-10-30: 2.4 mg via INTRAVENOUS
  Administered 2015-10-30: 7.2 mg via INTRAVENOUS
  Administered 2015-10-30: 25 mg via INTRAVENOUS
  Administered 2015-10-30: 1.8 mg via INTRAVENOUS
  Administered 2015-10-31: 9.6 mg via INTRAVENOUS
  Administered 2015-10-31: 7.2 mg via INTRAVENOUS
  Administered 2015-10-31: 5.79 mg via INTRAVENOUS
  Administered 2015-10-31: 19:00:00 via INTRAVENOUS
  Administered 2015-10-31: 4.2 mg via INTRAVENOUS
  Administered 2015-10-31: 0.6 mg via INTRAVENOUS
  Administered 2015-10-31: 1 mg via INTRAVENOUS
  Administered 2015-11-01: 4.2 mg via INTRAVENOUS
  Administered 2015-11-01: 13:00:00 via INTRAVENOUS
  Administered 2015-11-01: 15.54 mg via INTRAVENOUS
  Administered 2015-11-01 (×2): 4.8 mg via INTRAVENOUS
  Administered 2015-11-01: 8.9 mg via INTRAVENOUS
  Administered 2015-11-01: 8.4 mg via INTRAVENOUS
  Administered 2015-11-02: 4.8 mg via INTRAVENOUS
  Administered 2015-11-02: 6 mg via INTRAVENOUS
  Administered 2015-11-02: 4.2 mg via INTRAVENOUS
  Administered 2015-11-02: 03:00:00 via INTRAVENOUS
  Administered 2015-11-02: 12 mg via INTRAVENOUS
  Administered 2015-11-02: 6 mg via INTRAVENOUS
  Administered 2015-11-02: 4.2 mg via INTRAVENOUS
  Administered 2015-11-02: 20:00:00 via INTRAVENOUS
  Administered 2015-11-03: 10.2 mg via INTRAVENOUS
  Administered 2015-11-03: 6.6 mg via INTRAVENOUS
  Administered 2015-11-03: 4.2 mg via INTRAVENOUS
  Filled 2015-10-27 (×10): qty 25

## 2015-10-27 MED ORDER — DIPHENHYDRAMINE HCL 25 MG PO CAPS
25.0000 mg | ORAL_CAPSULE | ORAL | Status: DC | PRN
  Administered 2015-10-27: 25 mg via ORAL
  Filled 2015-10-27: qty 1

## 2015-10-27 MED ORDER — KETOROLAC TROMETHAMINE 30 MG/ML IJ SOLN
30.0000 mg | Freq: Once | INTRAMUSCULAR | Status: AC
  Administered 2015-10-27: 30 mg via INTRAVENOUS
  Filled 2015-10-27: qty 1

## 2015-10-27 MED ORDER — SENNOSIDES-DOCUSATE SODIUM 8.6-50 MG PO TABS
1.0000 | ORAL_TABLET | Freq: Two times a day (BID) | ORAL | Status: DC
  Administered 2015-10-27 – 2015-11-03 (×14): 1 via ORAL
  Filled 2015-10-27 (×15): qty 1

## 2015-10-27 MED ORDER — GABAPENTIN 300 MG PO CAPS
300.0000 mg | ORAL_CAPSULE | Freq: Three times a day (TID) | ORAL | Status: DC
  Administered 2015-10-27 – 2015-11-03 (×21): 300 mg via ORAL
  Filled 2015-10-27 (×21): qty 1

## 2015-10-27 MED ORDER — ONDANSETRON HCL 4 MG/2ML IJ SOLN: 4.0000 mg | Freq: Four times a day (QID) | INTRAMUSCULAR | Status: DC | PRN

## 2015-10-27 MED ORDER — TORSEMIDE 20 MG PO TABS
20.0000 mg | ORAL_TABLET | Freq: Every day | ORAL | Status: DC | PRN
  Administered 2015-10-29: 20 mg via ORAL
  Filled 2015-10-27 (×2): qty 1

## 2015-10-27 MED ORDER — ASPIRIN 81 MG PO CHEW
81.0000 mg | CHEWABLE_TABLET | Freq: Every day | ORAL | Status: DC
  Administered 2015-10-27 – 2015-11-03 (×8): 81 mg via ORAL
  Filled 2015-10-27 (×8): qty 1

## 2015-10-27 MED ORDER — RIVAROXABAN 20 MG PO TABS
20.0000 mg | ORAL_TABLET | Freq: Every day | ORAL | Status: DC
  Administered 2015-10-27 – 2015-10-28 (×2): 20 mg via ORAL
  Filled 2015-10-27 (×3): qty 1

## 2015-10-27 MED ORDER — DIPHENHYDRAMINE HCL 25 MG PO CAPS
25.0000 mg | ORAL_CAPSULE | ORAL | Status: DC | PRN
  Administered 2015-10-29: 50 mg via ORAL
  Filled 2015-10-27: qty 2

## 2015-10-27 MED ORDER — HYDROMORPHONE HCL 2 MG/ML IJ SOLN
2.0000 mg | Freq: Once | INTRAMUSCULAR | Status: AC
  Administered 2015-10-27: 2 mg via INTRAVENOUS
  Filled 2015-10-27: qty 1

## 2015-10-27 MED ORDER — HYDROMORPHONE HCL 2 MG/ML IJ SOLN
2.0000 mg | INTRAMUSCULAR | Status: DC
  Administered 2015-10-27 – 2015-10-31 (×43): 2 mg via INTRAVENOUS
  Filled 2015-10-27 (×43): qty 1

## 2015-10-27 MED ORDER — VITAMIN D 1000 UNITS PO TABS
2000.0000 [IU] | ORAL_TABLET | Freq: Every day | ORAL | Status: DC
  Administered 2015-10-27 – 2015-11-03 (×8): 2000 [IU] via ORAL
  Filled 2015-10-27 (×8): qty 2

## 2015-10-27 MED ORDER — NALOXONE HCL 0.4 MG/ML IJ SOLN
0.4000 mg | INTRAMUSCULAR | Status: DC | PRN
  Administered 2015-10-28: 0.4 mg via INTRAVENOUS
  Filled 2015-10-27: qty 1

## 2015-10-27 MED ORDER — SODIUM CHLORIDE 0.9 % IV SOLN
25.0000 mg | INTRAVENOUS | Status: DC | PRN
  Filled 2015-10-27: qty 0.5

## 2015-10-27 NOTE — ED Provider Notes (Signed)
[facility] DEPT Provider Note   CSN: [id number] Arrival date & time: 10/27/15  [id number]     History   Chief Complaint Chief Complaint  Patient presents with  . Sickle Cell Pain Crisis    HPI [name] is a 35 y.o. male.   Sickle Cell Pain Crisis  Location:  Back (ribs in the lower flanks bilaterally) Severity:  Severe Onset quality:  Gradual Duration: woke him up this am. Similar to previous crisis episodes: yes   Timing:  Constant Progression:  Worsening Chronicity:  Recurrent Frequency of attacks:  Frequently has to come to the ED History of pulmonary emboli: yes   Context: not change in medication, not cold exposure and not infection   Relieved by:  Nothing Worsened by:  Nothing Ineffective treatments:  Prescription drugs Associated symptoms: no cough, no fever, no shortness of breath and no vomiting   Risk factors: frequent pain crises     Past Medical History:  Diagnosis Date  . Acute chest syndrome ([place]) 06/18/2013  . Acute embolism and thrombosis of right internal jugular vein ([place])   . Alcohol consumption of one to four drinks per day   . Avascular necrosis (HCC)    Right Hip  . Blood transfusion   . Chronic anticoagulation   . Demand ischemia ([place]) 01/02/2014  . Former smoker   . Functional asplenia   . Hb-SS disease with crisis ([place])   . History of Clostridium difficile infection   . History of pulmonary embolus (PE)   . Hypertension   . Hypokalemia   . Leukocytosis    Chronic  . Mood disorder ([place])   . Noncompliance with medication regimen   . Oxygen deficiency   . Pulmonary hypertension ([place])   . Second hand tobacco smoke exposure   . Sickle cell anemia (HCC)   . Sickle-cell crisis with associated acute chest syndrome ([place]) 05/13/2013  . Thrombocytosis (HCC)    Chronic  . Uses marijuana     Patient Active Problem List   Diagnosis Date Noted  . Sickle cell pain crisis ([place]) 09/18/2015  . Sickle cell anemia with crisis ([place])  09/04/2015  . Shortness of breath   . Pulmonary embolism without acute cor pulmonale ([place])   . Fever   . Sickle cell anemia with pain ([place]) 05/07/2015  . Hypoxia   . Anemia 01/31/2015  . Symptomatic anemia   . Hb-SS disease without crisis ([place]) 10/07/2014  . PVC's (premature ventricular contractions) 09/10/2014  . Abnormal EKG 09/10/2014  . Chronic atrial fibrillation ([place]) 09/09/2014  . Cor pulmonale, chronic ([place]) 08/25/2014  . Sickle cell anemia ([place]) 06/25/2014  . Anemia of chronic disease 06/25/2014  . PAH (pulmonary artery hypertension) ([place]) 03/18/2014  . Hb-SS disease with crisis ([place]) 01/22/2014  . Paralytic strabismus, external ophthalmoplegia   . Chronic pain syndrome 12/12/2013  . Chronic anticoagulation 08/22/2013  . Essential hypertension 08/22/2013  . Pulmonary HTN ([place]) 06/18/2013  . Functional asplenia   . Vitamin D deficiency 02/13/2013  . Hx of pulmonary embolus 06/29/2012  . Hemochromatosis 12/14/2011  . Avascular necrosis [facility])     Past Surgical History:  Procedure Laterality Date  . CHOLECYSTECTOMY     01/2008  . Excision of left periauricular cyst     10/2009  . Excision of right ear lobe cyst with primary closur     11/2007  . Porta cath placement    . Porta cath removal    . PORTACATH PLACEMENT  01/05/2012   Procedure:  INSERTION PORT-A-CATH;  Surgeon: Odis Hollingshead, MD;  Location: [name];  Service: General;  Laterality: N/A;  ultrasound guiced port a cath insertion with fluoroscopy  . Right hip replacement     08/2006  . UMBILICAL HERNIA REPAIR     01/2008       Home Medications    Prior to Admission medications   Medication Sig Start Date End Date Taking? Authorizing Provider  aspirin 81 MG chewable tablet Chew 1 tablet (81 mg total) by mouth daily. 07/23/14  Yes [name], MD  Cholecalciferol (VITAMIN D) 2000 units tablet Take 1 tablet (2,000 Units total) by mouth daily. 02/23/15  Yes Dorena Dew, FNP  Deferasirox  (JADENU) 360 MG TABS Take 1,080 mg by mouth daily.    Yes Historical Provider, MD  folic acid (FOLVITE) 1 MG tablet Take 1 tablet (1 mg total) by mouth every morning. 02/23/15  Yes Dorena Dew, FNP  gabapentin (NEURONTIN) 300 MG capsule Take 1 capsule (300 mg total) by mouth 3 (three) times daily. 06/01/15  Yes Tresa Garter, MD  HYDROmorphone (DILAUDID) 4 MG tablet Take 1 tablet (4 mg total) by mouth every 4 (four) hours as needed for severe pain. 10/16/15  Yes Tresa Garter, MD  lisinopril (PRINIVIL,ZESTRIL) 5 MG tablet Take 1 tablet (5 mg total) by mouth daily. 08/04/15  Yes [name], MD  morphine (MSIR) 30 MG tablet Take 1 tablet (30 mg total) by mouth every 12 (twelve) hours. 10/16/15  Yes Tresa Garter, MD  rivaroxaban (XARELTO) 20 MG TABS tablet Take 20 mg by mouth daily.    Yes Historical Provider, MD  torsemide (DEMADEX) 20 MG tablet Take 20 mg by mouth daily as needed. For increase in patient weight greater than 3 lbs. 07/27/15 07/26/16 Yes Historical Provider, MD  zolpidem (AMBIEN) 10 MG tablet Take 1 tablet (10 mg total) by mouth at bedtime as needed for sleep. 10/09/15  Yes Micheline Chapman, NP  potassium chloride SA (K-DUR,KLOR-CON) 20 MEQ tablet Take 1 tablet (20 mEq total) by mouth every morning. Patient not taking: Reported on 10/27/2015 04/13/15   Dorena Dew, FNP    Family History Family History  Problem Relation Age of Onset  . Sickle cell trait Mother   . Depression Mother   . Diabetes Mother   . Sickle cell trait Father   . Sickle cell trait Brother     Social History Social History  Substance Use Topics  . Smoking status: Former Smoker    Packs/day: 0.50    Years: 10.00    Types: Cigarettes    Quit date: 05/29/2011  . Smokeless tobacco: Never Used  . Alcohol use No     Allergies   Review of patient's allergies indicates no known allergies.   Review of Systems Review of Systems  Constitutional: Negative for fever.    Respiratory: Negative for cough and shortness of breath.   Gastrointestinal: Negative for vomiting.     Physical Exam Updated Vital Signs BP 121/76   Pulse 84   Temp 98 F (36.7 C) (Oral)   Resp 17   Ht 6' (1.829 m)   Wt 78.5 kg   SpO2 91%   BMI 23.46 kg/m   Physical Exam  Constitutional: He appears well-developed and well-nourished. No distress.  HENT:  Head: Normocephalic and atraumatic.  Right Ear: External ear normal.  Left Ear: External ear normal.  Eyes: Conjunctivae are normal. Right eye exhibits no discharge. Left eye  exhibits no discharge. No scleral icterus.  Neck: Neck supple. No tracheal deviation present.  Cardiovascular: Normal rate, regular rhythm and intact distal pulses.   Pulmonary/Chest: Effort normal and breath sounds normal. No stridor. No respiratory distress. He has no wheezes. He has no rales.  Flank tenderness  Abdominal: Soft. Bowel sounds are normal. He exhibits no distension. There is no tenderness. There is no rebound and no guarding.  Musculoskeletal: He exhibits no edema or tenderness.  Neurological: He is alert. He has normal strength. No cranial nerve deficit (no facial droop, extraocular movements intact, no slurred speech) or sensory deficit. He exhibits normal muscle tone. He displays no seizure activity. Coordination normal.  Skin: Skin is warm and dry. No rash noted.  Psychiatric: He has a normal mood and affect.  Nursing note and vitals reviewed.    ED Treatments / Results  Labs (all labs ordered are listed, but only abnormal results are displayed) Labs Reviewed  COMPREHENSIVE METABOLIC PANEL - Abnormal; Notable for the following:       Result Value   Potassium 3.4 (*)    Chloride 112 (*)    CO2 20 (*)    Glucose, Bld 100 (*)    Calcium 8.7 (*)    Total Bilirubin 6.1 (*)    All other components within normal limits  CBC WITH DIFFERENTIAL/PLATELET - Abnormal; Notable for the following:    WBC 12.7 (*)    RBC 2.52 (*)     Hemoglobin 7.6 (*)    HCT 22.1 (*)    RDW 19.8 (*)    Platelets 490 (*)    Neutro Abs 8.5 (*)    Monocytes Absolute 1.9 (*)    All other components within normal limits  D-DIMER, QUANTITATIVE (NOT AT [facility])    Radiology Dg Chest 2 View  Result Date: 10/27/2015 CLINICAL DATA:  Sickle cell pain. New mid to left lateral chest pain this morning. EXAM: CHEST  2 VIEW COMPARISON:  10/23/2015 FINDINGS: The cardiac silhouette is enlarged but stable in appearance. Left-sided port catheter tip terminates in the proximal right atrium, unchanged. No pneumonic consolidation nor overt pulmonary edema. Minimal subpleural scarring in the right upper lobe. No effusion or pneumothorax. No suspicious osseous abnormalities. 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 Right upper quadrant surgical clips consistent with cholecystectomy IMPRESSION: Stable cardiomegaly without acute pulmonary disease. Electronically Signed   By: Ashley Royalty M.D.   On: 10/27/2015 08:10    Procedures Procedures (including critical care time)  Medications Ordered in ED Medications  0.45 % sodium chloride infusion (125 mL/hr Intravenous New Bag/Given 10/27/15 0803)  diphenhydrAMINE (BENADRYL) capsule 25-50 mg (25 mg Oral Given 10/27/15 0803)  ondansetron (ZOFRAN) injection 4 mg (4 mg Intravenous Given 10/27/15 0804)  ketorolac (TORADOL) 30 MG/ML injection 30 mg (not administered)  HYDROmorphone (DILAUDID) injection 2 mg (2 mg Intravenous Given 10/27/15 0920)     Initial Impression / Assessment and Plan / ED Course  I have reviewed the triage vital signs and the nursing notes.  Pertinent labs & imaging results that were available during my care of the patient were reviewed by me and considered in my medical decision making (see chart for details).  Clinical Course  Comment By Time  Pt is still having pain.  No controlled with 3 doses of dilaudid.  Feels he needs to be in the hospital.  I will consult the  hospitalist. Dorie Rank, MD 09/26 1039    Discussed with Dr Zigmund Daniel.  Will  admit for further treatment  Final Clinical Impressions(s) / ED Diagnoses   Final diagnoses:  Sickle cell pain crisis ([place])      Dorie Rank, MD 10/27/15 1102

## 2015-10-27 NOTE — ED Notes (Signed)
Patient transported to X-ray 

## 2015-10-27 NOTE — ED Triage Notes (Signed)
Pt is c/o bilateral flank pain that woke him up around 4am this morning

## 2015-10-27 NOTE — ED Notes (Signed)
Patient placed on O2 2L/min via [place]. Sats increased to 90% Patient increased to 3L/min via [place] and sats increased to 92/93%

## 2015-10-27 NOTE — ED Notes (Signed)
Attempted to call report. Receiving nurse to call back for report. 

## 2015-10-27 NOTE — H&P (Addendum)
Hospital Admission Note Date: 10/27/2015  Patient name: [name] Medical record number: [id number] Date of birth: Jan 15, 1981 Age: 35 y.o. Gender: male PCP: [name], MD  Attending physician: [name], MD  Chief Complaint:Pain in ribs and back x 1 day.   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 He denies any fevers, chills, nausea, vomiting or diarrhea. He does have a h/o pulmonary hypertension and  normally uses Oxygen at night but does not require Oxygen during the waking hours. His baseline Hb is 8.0 while using Hydrea. He has chronic pain syndrome and is on MS contin 30 mg every 12 hours.  In the ED he was found to be hypoxic and a CXR was at baseline. He received 3 doses of Dilaudid, 1 dose of Toradol and IVF. His pain was still >7/10 and I am asked to admit him for sickle cell crisis.     Scheduled Meds: . aspirin  81 mg Oral Daily  . Deferasirox  1,080 mg Oral Daily  . folic acid  1 mg Oral q morning - 10a  . gabapentin  300 mg Oral TID  . HYDROmorphone   Intravenous Q4H  .  HYDROmorphone (DILAUDID) injection  2 mg Intravenous Once  . [START ON 10/28/2015] Influenza vac split quadrivalent PF  0.5 mL Intramuscular Tomorrow-1000  . ketorolac  30 mg Intravenous Q6H  . rivaroxaban  20 mg Oral Daily  . senna-docusate  1 tablet Oral BID  . Vitamin D  2,000 Units Oral Daily   Continuous Infusions: . sodium chloride 125 mL/hr (10/27/15 0803)   PRN Meds:.diphenhydrAMINE **OR** diphenhydrAMINE (BENADRYL) IVPB(SICKLE CELL ONLY), naloxone **AND** sodium chloride flush, ondansetron (ZOFRAN) IV, polyethylene glycol, torsemide Allergies: Review of patient's allergies indicates no known allergies. Past Medical History:  Diagnosis Date  . Acute chest  syndrome ([place]) 06/18/2013  . Acute embolism and thrombosis of right internal jugular vein ([place])   . Alcohol consumption of one to four drinks per day   . Avascular necrosis (HCC)    Right Hip  . Blood transfusion   . Chronic anticoagulation   . Demand ischemia ([place]) 01/02/2014  . Former smoker   . Functional asplenia   . Hb-SS disease with crisis ([place])   . History of Clostridium difficile infection   . History of pulmonary embolus (PE)   . Hypertension   . Hypokalemia   . Leukocytosis    Chronic  . Mood disorder ([place])   . Noncompliance with medication regimen   . Oxygen deficiency   . Pulmonary hypertension ([place])   . Second hand tobacco smoke exposure   . Sickle cell anemia (HCC)   . Sickle-cell crisis with associated acute chest syndrome ([place]) 05/13/2013  . Thrombocytosis (HCC)    Chronic  . Uses marijuana    Past Surgical History:  Procedure Laterality Date  . CHOLECYSTECTOMY     01/2008  . Excision of left periauricular cyst     10/2009  . Excision of right ear lobe cyst with primary closur     11/2007  . Porta cath placement    . Porta cath removal    . PORTACATH PLACEMENT  01/05/2012   Procedure: INSERTION PORT-A-CATH;  Surgeon: Odis Hollingshead, MD;  Location: [facility];  Service:  General;  Laterality: N/A;  ultrasound guiced port a cath insertion with fluoroscopy  . Right hip replacement     08/2006  . UMBILICAL HERNIA REPAIR     01/2008   Family History  Problem Relation Age of Onset  . Sickle cell trait Mother   . Depression Mother   . Diabetes Mother   . Sickle cell trait Father   . Sickle cell trait Brother    Social History   Social History  . Marital status: Single    Spouse name: N/A  . Number of children: 0  . Years of education: 2   Occupational History  . 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 Smoking status: Former Smoker    Packs/day: 0.50    Years: 10.00    Types:  Cigarettes    Quit date: 05/29/2011  . Smokeless tobacco: Never Used  . Alcohol use No  . Drug use: No     Comment: Marijuana weekly  . Sexual activity: Yes    Partners: Female     Comment: month ago   Other Topics Concern  . Not on file   Social History Narrative   Lives in an apartment.  Single.  Lives alone but has a girlfriend that helps care for him.  Does not use any assist devices.        [name]  [phone] Mom, emergency contact      [facility]:   Patient continuing to live in her apartment in town alone. 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   Review of Systems: Pertinent items noted in HPI and remainder of comprehensive ROS otherwise negative.  Physical Exam: No intake or output data in the 24 hours ending 10/27/15 1245 General: Alert, awake, oriented x3, in  acute distress secondary to pain.  HEENT: [place]/AT PEERL, EOMI, very mild icterus Neck: Trachea midline,  no masses, no thyromegal,y no JVD, no carotid bruit OROPHARYNX:  Moist, No exudate/ erythema/lesions.  Heart: Regular rate and rhythm, without murmurs, rubs, gallops, PMI non-displaced, no heaves or thrills on palpation.  Lungs: Clear to auscultation except decreased breath sounds at bases, no wheezing or rhonchi noted. No increased vocal fremitus resonant to percussion  Abdomen: Soft, nontender, nondistended, positive bowel sounds, no masses no hepatosplenomegaly noted.  Neuro: No focal neurological deficits noted cranial nerves II through XII grossly intact.  Strength at functional baselinein bilateral upper and lower extremities. Musculoskeletal: No warmth swelling or erythema around joints, no spinal tenderness noted. Psychiatric: Patient alert and oriented x3, good insight and cognition, good recent to remote recall.   Lab results:  Recent Labs  10/27/15 0705  NA 139  K 3.4*  CL 112*  CO2 20*  GLUCOSE 100*  BUN 14  CREATININE 0.76  CALCIUM 8.7*    Recent Labs  10/27/15 0705   AST 30  ALT 20  ALKPHOS 81  BILITOT 6.1*  PROT 7.3  ALBUMIN 4.2   No results for input(s): LIPASE, AMYLASE in the last 72 hours.  Recent Labs  10/27/15 0705  WBC 12.7*  NEUTROABS 8.5*  HGB 7.6*  HCT 22.1*  MCV 87.7  PLT 490*   No results for input(s): CKTOTAL, CKMB, CKMBINDEX, TROPONINI in the last 72 hours. Invalid input(s): POCBNP  Recent Labs  10/27/15 0705  DDIMER 0.27   No results for input(s): HGBA1C in the last 72 hours. No results for input(s): CHOL,  HDL, LDLCALC, TRIG, CHOLHDL, LDLDIRECT in the last 72 hours. No results for input(s): TSH, T4TOTAL, T3FREE, THYROIDAB in the last 72 hours.  Invalid input(s): FREET3 No results for input(s): VITAMINB12, FOLATE, FERRITIN, TIBC, IRON, RETICCTPCT in the last 72 hours. Imaging results:  Dg Chest 2 View  Result Date: 10/27/2015 CLINICAL DATA:  Sickle cell pain. New mid to left lateral chest pain this morning. EXAM: CHEST  2 VIEW COMPARISON:  10/23/2015 FINDINGS: The cardiac silhouette is enlarged but stable in appearance. Left-sided port catheter tip terminates in the proximal right atrium, unchanged. No pneumonic consolidation nor overt pulmonary edema. Minimal subpleural scarring in the right upper lobe. No effusion or pneumothorax. No suspicious osseous abnormalities. 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 Right upper quadrant surgical clips consistent with cholecystectomy IMPRESSION: Stable cardiomegaly without acute pulmonary disease. Electronically Signed   By: Ashley Royalty M.D.   On: 10/27/2015 08:10   Dg Chest 2 View  Result Date: 10/23/2015 CLINICAL DATA:  Tachypnea and hypoxia EXAM: CHEST  2 VIEW COMPARISON:  09/26/2015 FINDINGS: Chronic cardiomegaly. Porta catheter on the left with tip at the upper right atrium. Chronic interstitial coarsening with small scattered scar-like opacities. There is no edema, consolidation, effusion, or pneumothorax. Bilateral humeral head avascular necrosis.  IMPRESSION: Stable from prior.  No evidence of acute disease. Electronically Signed   By: Monte Fantasia M.D.   On: 10/23/2015 14:27      Assessment and Plan: 1. HB SS with crisis: Will start on PCA, Toradol and IVF. Continue on MS Contin and schedule clinician assisted doses. Re-assess tomorrow morning.  2. Hypoxemia: I suspect that this is due to hypoventilation. Encourage incentive spirometer.   3. Anemia of Chronic Disease: Hb almost at baseline. Continue to monitor and continue Hydrea. Pt reports that his Hydrea was held  4. Chronic pain syndrome: Continue MS Contin.  5. Secondary Hemochromatosis: Continue Jadenu 6. Pulmonary Hypertension: Support with supplemental Oxygen. Pt is also on Ambrisentan 5 mg daily.Will continue.    7. 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 Continue.   In excess of 70 minutes spent  during this visit. Greater than 50 % of the time spent in face to face contact, assessment, counseling and coordination of care.    Traylon Schimming A. 10/27/2015, 12:45 PM

## 2015-10-28 ENCOUNTER — Inpatient Hospital Stay (HOSPITAL_COMMUNITY): Payer: Medicare Other

## 2015-10-28 DIAGNOSIS — R0902 Hypoxemia: Secondary | ICD-10-CM

## 2015-10-28 LAB — CBC WITH DIFFERENTIAL/PLATELET
BASOS PCT: 0 %
Basophils Absolute: 0.1 10*3/uL (ref 0.0–0.1)
EOS ABS: 0.4 10*3/uL (ref 0.0–0.7)
EOS PCT: 2 %
HCT: 20.9 % — ABNORMAL LOW (ref 39.0–52.0)
Hemoglobin: 7.1 g/dL — ABNORMAL LOW (ref 13.0–17.0)
LYMPHS ABS: 2.4 10*3/uL (ref 0.7–4.0)
Lymphocytes Relative: 13 %
MCH: 30.5 pg (ref 26.0–34.0)
MCHC: 34 g/dL (ref 30.0–36.0)
MCV: 89.7 fL (ref 78.0–100.0)
MONOS PCT: 12 %
Monocytes Absolute: 2.2 10*3/uL — ABNORMAL HIGH (ref 0.1–1.0)
Neutro Abs: 13.5 10*3/uL — ABNORMAL HIGH (ref 1.7–7.7)
Neutrophils Relative %: 73 %
PLATELETS: 467 10*3/uL — AB (ref 150–400)
RBC: 2.33 MIL/uL — ABNORMAL LOW (ref 4.22–5.81)
RDW: 19.8 % — ABNORMAL HIGH (ref 11.5–15.5)
WBC: 18.6 10*3/uL — AB (ref 4.0–10.5)

## 2015-10-28 LAB — BASIC METABOLIC PANEL
Anion gap: 5 (ref 5–15)
BUN: 13 mg/dL (ref 6–20)
CALCIUM: 8.8 mg/dL — AB (ref 8.9–10.3)
CO2: 22 mmol/L (ref 22–32)
CREATININE: 0.75 mg/dL (ref 0.61–1.24)
Chloride: 113 mmol/L — ABNORMAL HIGH (ref 101–111)
Glucose, Bld: 111 mg/dL — ABNORMAL HIGH (ref 65–99)
Potassium: 3.9 mmol/L (ref 3.5–5.1)
SODIUM: 140 mmol/L (ref 135–145)

## 2015-10-28 LAB — RETICULOCYTES
RBC.: 2.33 MIL/uL — ABNORMAL LOW (ref 4.22–5.81)
RETIC CT PCT: 7.3 % — AB (ref 0.4–3.1)
Retic Count, Absolute: 170.1 10*3/uL (ref 19.0–186.0)

## 2015-10-28 LAB — TYPE AND SCREEN
ABO/RH(D): O POS
Antibody Screen: NEGATIVE

## 2015-10-28 LAB — MRSA PCR SCREENING: MRSA by PCR: NEGATIVE

## 2015-10-28 MED ORDER — SODIUM CHLORIDE 0.9% FLUSH: 10.0000 mL | INTRAVENOUS | Status: DC | PRN

## 2015-10-28 NOTE — Progress Notes (Signed)
Pt oxygen saturation began to decline during the morning. The oxygen saturation at one point dropped as low as 79%. At the point the hospitalist was notified and this nurse was instructed to give narcan. After giving narcan oxygen saturation immediately returned to normal levels .   About 30 mins to a hour later pt requested pain medication and o2 saturation returned to the high 80's. This problem continued.

## 2015-10-28 NOTE — Progress Notes (Signed)
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 Hb SS with crisis: Pt's pain now at 10/10 after narcan. Continue current regimen of scheduled clinician assisted doses, PCA and Toradol. Continue PCA.  2. Hypoxemia: Etiology unclear. 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 I will not pursue a CTA as patient is stable and  this will not change the course of therapy. Furthermore he has had exposure to multiple CTA's in the last year. CXR negative for acute process. Will encourage incentive spirometer and re-assess.  3. Leukocytosis:Pt has no evidence of infection and I feel that the acute increase in WBC is directly related to crisis and stress state associated with opiate reversal. 4. Anemia of chronic disease: Hb at baseline.  5. Chronic pain: Continue MS Contin 6. Pulmonary Hypertension: Continue Ambrisentan 7. Chronic Anticoagulation: continue Xarelto  Code Status: Full Code Family Communication: N/A Disposition Plan: Not yet ready for discharge  [place].  Pager [phone]. If 7PM-7AM, please contact night-coverage.  10/28/2015, 1:54 PM  LOS: 1 day   Interim History: Patient was hypoxic overnight and received Narcan. However per his recall and nurses report the patient had no decrease in level of consciousness and was fully awake and appropriately conversant. Additionally all recorded respirations were >15 BPM.  Since receiving Narcan, his pain is at 10/10. The patient has received his pain medications today without any adverse effects or narcotization.   Consultants:  none  Procedures:  none  Antibiotics:  none   Objective: Vitals:   10/28/15 0800 10/28/15 0950 10/28/15 1244 10/28/15 1308  BP:  140/85  (!) 149/91  Pulse:  (!) 102  (!) 106  Resp: 19 (!) 21 (!) 22 (!) 21  Temp:  98.5 F  (36.9 C)  98.7 F (37.1 C)  TempSrc:  Oral  Oral  SpO2: 90% 93% 92% 90%  Weight:      Height:       Weight change: -0.272 kg (-9.6 oz)  Intake/Output Summary (Last 24 hours) at 10/28/15 1354 Last data filed at 10/28/15 1000  Gross per 24 hour  Intake              850 ml  Output              850 ml  Net                0 ml    General: Alert, awake, oriented x3, in no acute distress.  HEENT: [place]/AT [place]ER[place], EOMI. Mild icterus. Neck: Trachea midline,  no masses, no thyromegal,y no JVD, no carotid bruit OROPHARYNX:  Moist, No exudate/ erythema/lesions.  Heart: Regular rate and rhythm, without murmurs, rubs, gallops, PMI non-displaced, no heaves or thrills on palpation.  Lungs: Clear to auscultation, no wheezing or rhonchi noted. No increased vocal fremitus resonant to percussion  Abdomen: Soft, nontender, nondistended, positive bowel sounds, no masses no hepatosplenomegaly noted.  Neuro: No focal neurological deficits noted cranial nerves II through XII grossly intact.  Strength at functional baseline in bilateral upper and lower extremities. Musculoskeletal: No warmth swelling or erythema around joints, no spinal tenderness noted. Psychiatric: Patient alert and oriented x3, good insight and cognition, good recent to remote recall.    Data Reviewed: Basic Metabolic Panel:  Recent Labs Lab 10/27/15 0705 10/28/15 0954  NA 139 140  K 3.4* 3.9  CL 112* 113*  CO2 20* 22  GLUCOSE 100* 111*  BUN 14 13  CREATININE 0.76 0.75  CALCIUM 8.7* 8.8*   Liver Function Tests:  Recent Labs Lab 10/27/15 0705  AST 30  ALT 20  ALKPHOS 81  BILITOT 6.1*  PROT 7.3  ALBUMIN 4.2   No results for input(s): LIPASE, AMYLASE in the last 168 hours. No results for input(s): AMMONIA in the last 168 hours. CBC:  Recent Labs Lab 10/27/15 0705 10/28/15 0954  WBC 12.7* 18.6*  NEUTROABS 8.5* 13.5*  HGB 7.6* 7.1*  HCT 22.1* 20.9*  MCV 87.7 89.7  PLT 490* 467*   Cardiac Enzymes: No results  for input(s): CKTOTAL, CKMB, CKMBINDEX, TROPONINI in the last 168 hours. BNP (last 3 results)  Recent Labs  11/30/14 0910 08/02/15 2307  BNP 639.7* 503.2*    ProBNP (last 3 results) No results for input(s): PROBNP in the last 8760 hours.  CBG: No results for input(s): GLUCAP in the last 168 hours.  No results found for this or any previous visit (from the past 240 hour(s)).   Studies: Dg Chest 2 View  Result Date: 10/28/2015 CLINICAL DATA:  Sickle cell crisis, bilateral flank pain, former smoker, history of hypertension. EXAM: CHEST  2 VIEW COMPARISON:  PA and lateral chest x-ray of October 27, 2015 FINDINGS: The lungs are adequately inflated. The interstitial markings remain increased. The pulmonary vascularity remains engorged and the cardiac silhouette remains enlarged. There is no alveolar pneumonia nor pleural effusion or pneumothorax. The power port catheter tip projects over the distal third of the SVC. There is multilevel degenerative disc disease of the thoracic spine. IMPRESSION: Stable cardiomegaly with mild pulmonary vascular prominence. No pneumonia nor other acute cardiopulmonary abnormality. Electronically Signed   By: David  Martinique M.D.   On: 10/28/2015 09:32   Dg Chest 2 View  Result Date: 10/27/2015 CLINICAL DATA:  Sickle cell pain. New mid to left lateral chest pain this morning. EXAM: CHEST  2 VIEW COMPARISON:  10/23/2015 FINDINGS: The cardiac silhouette is enlarged but stable in appearance. Left-sided port catheter tip terminates in the proximal right atrium, unchanged. No pneumonic consolidation nor overt pulmonary edema. Minimal subpleural scarring in the right upper lobe. No effusion or pneumothorax. No suspicious osseous abnormalities. 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 Right upper quadrant surgical clips consistent with cholecystectomy IMPRESSION: Stable cardiomegaly without acute pulmonary disease. Electronically Signed   By:  Ashley Royalty M.D.   On: 10/27/2015 08:10   Dg Chest 2 View  Result Date: 10/23/2015 CLINICAL DATA:  Tachypnea and hypoxia EXAM: CHEST  2 VIEW COMPARISON:  09/26/2015 FINDINGS: Chronic cardiomegaly. Porta catheter on the left with tip at the upper right atrium. Chronic interstitial coarsening with small scattered scar-like opacities. There is no edema, consolidation, effusion, or pneumothorax. Bilateral humeral head avascular necrosis. IMPRESSION: Stable from prior.  No evidence of acute disease. Electronically Signed   By: Monte Fantasia M.D.   On: 10/23/2015 14:27    Scheduled Meds: . ambrisentan  5 mg Oral QHS  . aspirin  81 mg Oral Daily  . cholecalciferol  2,000 Units Oral Daily  . Deferasirox  1,080 mg Oral Daily  . folic acid  1 mg Oral q morning - 10a  . gabapentin  300 mg Oral TID  . HYDROmorphone   Intravenous Q4H  .  HYDROmorphone (DILAUDID) injection  2 mg Intravenous Q2H  . ketorolac  30 mg Intravenous Q6H  . morphine  30 mg Oral  Q12H  . rivaroxaban  20 mg Oral Daily  . senna-docusate  1 tablet Oral BID   Continuous Infusions: . sodium chloride 125 mL/hr at 10/28/15 0851      In excess of 30 minutes spent during this visit. Greater than 50% involved face to face contact with the patient for assessment, counseling and coordination of care.

## 2015-10-28 NOTE — Progress Notes (Signed)
Patient is alert and oriented with no signs of distress O2 sats 87%-89% on 5L O2.  Dr. Zigmund Daniel contacted for instructions on further doses of Dilaudid due to am dose of Narcan due to dropping O2 sats per report. New orders received. Will continue to monitor patient.

## 2015-10-29 DIAGNOSIS — D72829 Elevated white blood cell count, unspecified: Secondary | ICD-10-CM

## 2015-10-29 MED ORDER — RIVAROXABAN 20 MG PO TABS
20.0000 mg | ORAL_TABLET | Freq: Every day | ORAL | Status: DC
  Administered 2015-10-29 – 2015-11-02 (×5): 20 mg via ORAL
  Filled 2015-10-29 (×5): qty 1

## 2015-10-29 NOTE — Progress Notes (Signed)
Patient ambulated in hallway and tolerated well. SpO2 on 4L dropped to 67% at lowest point. Patient stopped performed deep breathing exercises and maintained 87%  SpO2 on 4L for remainder of walk. Complete walk approximately 360 ft around unit. Upon arrival back to room at rest SpO2 elevated to 95% on 4L. Patient encouraged to continue use of incentive spirometer. Patient performed teach back and demonstration using spirometer and reached level of 1750.

## 2015-10-29 NOTE — Consult Note (Signed)
   [facility] CM Inpatient Consult   10/29/2015  [name] 03/03/1980 [id number]   Patient screened for [name] Management services for multiple hospitalizations. Went to bedside to offer and explain [facility] Care Management program with patient. Patient declined [place] Management follow up. He is familiar with the program as he was active in the past. He confirms by stating "I do not need your services anymore".  Accepted [facility] Care Management brochure with contact information to call in future if changes mind. Will make inpatient RNCM aware that patient declined [place] Management program services.  Marthenia Rolling, MSN-Ed, RN,BSN [facility] Liaison [phone]

## 2015-10-29 NOTE — Progress Notes (Addendum)
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 Hb SS with crisis: Pt's last Hb electrophoresis post red cell pheresis showed a Hb S level of 20% and a Hb A of 75.5%. At these levels it's difficult yo attribute pain to SCD. Continue PCA and clinician assisted doses of Dilaudid and Toradol.  2. Hypoxemia: Hypoxemia persists. Pt was also without Ambrisentan for 3 weeks and I suspect that he his PAH has worsened since being off the medication. Additionally patient has had a weight increase of greater than 3#'s. Will ask nurse to give Torsemide as prescribed.  Will likely need to use oxygen until he is consistently taking Ambisentan. I do not believe that a transfusion will be of value as pt gets red cell Pheresis next week, and he is a this baseline Hb.  3. Leukocytosis: Pt has a chronic leukocytosis. There is no evidence of infection and I feel that the acute increase in WBC is likley related to increased bone marrrow activity.  4. Anemia of chronic disease: Hb at baseline.  5. Chronic pain: Continue MS Contin 15 mg  6. Pulmonary Hypertension: Continue Ambrisentan 7. Chronic Anticoagulation: continue Xarelto  Code Status: Full Code Family Communication: N/A Disposition Plan: Not yet ready for discharge  [place].  Pager [phone]. If 7PM-7AM, please contact night-coverage.  10/29/2015, 12:38 PM  LOS: 2 days   Interim History: Patient reports today that he was actually without his  all of his medications for about 3 weeks. His pain continues to be in the right chest wall at an intensity of 6/10.   Consultants:  None  Procedures:  None  Antibiotics:  None   Objective: Vitals:   10/29/15 0358 10/29/15 0557 10/29/15 0759 10/29/15 1013  BP:  (!) 123/94  126/88  Pulse:  94  89  Resp: 18 20 18 17   Temp:  99.2 F (37.3 C)  99.1  F (37.3 C)  TempSrc:  Oral  Oral  SpO2: 91% 95% 94% 92%  Weight:  80.8 kg (178 lb 2.1 oz)    Height:       Weight change: 2.6 kg (5 lb 11.7 oz)  Intake/Output Summary (Last 24 hours) at 10/29/15 1238 Last data filed at 10/29/15 1012  Gross per 24 hour  Intake             4510 ml  Output              877 ml  Net             3633 ml    General: Alert, awake, oriented x3, in no acute distress.  HEENT: [place]/AT [place], EOMI. Mild icterus at baseline. Neck: Trachea midline,  no masses, no thyromegal,y no JVD, no carotid bruit OROPHARYNX:  Moist, No exudate/ erythema/lesions.  Heart: Regular rate and rhythm, without murmurs, rubs, gallops, PMI non-displaced, no heaves or thrills on palpation.  Lungs: Clear to auscultation, no wheezing or rhonchi noted. No increased vocal fremitus resonant to percussion.  Abdomen: Soft, nontender, nondistended, positive bowel sounds, no masses no hepatosplenomegaly noted.  Neuro: No focal neurological deficits noted cranial nerves II through XII grossly intact.  Strength at functional baseline in bilateral upper and lower extremities. Musculoskeletal: No warmth swelling or erythema around joints, no spinal tenderness noted. Psychiatric: Patient alert and oriented x3, good insight and cognition, good recent to remote recall.  Data Reviewed: Basic Metabolic Panel:  Recent Labs Lab 10/27/15 0705 10/28/15 0954  NA 139 140  K 3.4* 3.9  CL 112* 113*  CO2 20* 22  GLUCOSE 100* 111*  BUN 14 13  CREATININE 0.76 0.75  CALCIUM 8.7* 8.8*   Liver Function Tests:  Recent Labs Lab 10/27/15 0705  AST 30  ALT 20  ALKPHOS 81  BILITOT 6.1*  PROT 7.3  ALBUMIN 4.2   No results for input(s): LIPASE, AMYLASE in the last 168 hours. No results for input(s): AMMONIA in the last 168 hours. CBC:  Recent Labs Lab 10/27/15 0705 10/28/15 0954  WBC 12.7* 18.6*  NEUTROABS 8.5* 13.5*  HGB 7.6* 7.1*  HCT 22.1* 20.9*  MCV 87.7 89.7  PLT 490* 467*    Cardiac Enzymes: No results for input(s): CKTOTAL, CKMB, CKMBINDEX, TROPONINI in the last 168 hours. BNP (last 3 results)  Recent Labs  11/30/14 0910 08/02/15 2307  BNP 639.7* 503.2*    ProBNP (last 3 results) No results for input(s): PROBNP in the last 8760 hours.  CBG: No results for input(s): GLUCAP in the last 168 hours.  Recent Results (from the past 240 hour(s))  MRSA PCR Screening     Status: None   Collection Time: 10/28/15  7:34 PM  Result Value Ref Range Status   MRSA by PCR NEGATIVE NEGATIVE Final    Comment:        The GeneXpert MRSA Assay (FDA approved for NASAL specimens only), is one component of a comprehensive MRSA colonization surveillance program. It is not intended to diagnose MRSA infection nor to guide or monitor treatment for MRSA infections.      Studies: Dg Chest 2 View  Result Date: 10/28/2015 CLINICAL DATA:  Sickle cell crisis, bilateral flank pain, former smoker, history of hypertension. EXAM: CHEST  2 VIEW COMPARISON:  PA and lateral chest x-ray of October 27, 2015 FINDINGS: The lungs are adequately inflated. The interstitial markings remain increased. The pulmonary vascularity remains engorged and the cardiac silhouette remains enlarged. There is no alveolar pneumonia nor pleural effusion or pneumothorax. The power port catheter tip projects over the distal third of the SVC. There is multilevel degenerative disc disease of the thoracic spine. IMPRESSION: Stable cardiomegaly with mild pulmonary vascular prominence. No pneumonia nor other acute cardiopulmonary abnormality. Electronically Signed   By: David  Martinique M.D.   On: 10/28/2015 09:32   Dg Chest 2 View  Result Date: 10/27/2015 CLINICAL DATA:  Sickle cell pain. New mid to left lateral chest pain this morning. EXAM: CHEST  2 VIEW COMPARISON:  10/23/2015 FINDINGS: The cardiac silhouette is enlarged but stable in appearance. Left-sided port catheter tip terminates in the proximal right  atrium, unchanged. No pneumonic consolidation nor overt pulmonary edema. Minimal subpleural scarring in the right upper lobe. No effusion or pneumothorax. No suspicious osseous abnormalities. 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 Right upper quadrant surgical clips consistent with cholecystectomy IMPRESSION: Stable cardiomegaly without acute pulmonary disease. Electronically Signed   By: Ashley Royalty M.D.   On: 10/27/2015 08:10   Dg Chest 2 View  Result Date: 10/23/2015 CLINICAL DATA:  Tachypnea and hypoxia EXAM: CHEST  2 VIEW COMPARISON:  09/26/2015 FINDINGS: Chronic cardiomegaly. Porta catheter on the left with tip at the upper right atrium. Chronic interstitial coarsening with small scattered scar-like opacities. There is no edema, consolidation, effusion, or pneumothorax. Bilateral humeral head avascular necrosis. IMPRESSION: Stable from prior.  No evidence of acute disease. Electronically Signed   By:  Monte Fantasia M.D.   On: 10/23/2015 14:27    Scheduled Meds: . ambrisentan  5 mg Oral QHS  . aspirin  81 mg Oral Daily  . cholecalciferol  2,000 Units Oral Daily  . Deferasirox  1,080 mg Oral Daily  . folic acid  1 mg Oral q morning - 10a  . gabapentin  300 mg Oral TID  . HYDROmorphone   Intravenous Q4H  .  HYDROmorphone (DILAUDID) injection  2 mg Intravenous Q2H  . ketorolac  30 mg Intravenous Q6H  . morphine  30 mg Oral Q12H  . rivaroxaban  20 mg Oral Q supper  . senna-docusate  1 tablet Oral BID   Continuous Infusions: . sodium chloride 125 mL/hr at 10/28/15 2352      In excess of 25 minutes spent during this visit. Greater than 50% involved face to face contact with the patient for assessment, counseling and coordination of care.

## 2015-10-29 NOTE — Progress Notes (Signed)
Patient given PRN dose of torsemide due to increase weight gain of more than 3lbs and per MD request. Will continue to monitor.

## 2015-10-29 NOTE — Progress Notes (Signed)
This RN assumed care of pt as of 2300. I agree with previous RN's charted assessment. Continue to monitor. Hortencia Conradi RN

## 2015-10-30 LAB — CBC WITH DIFFERENTIAL/PLATELET
Basophils Absolute: 0.1 10*3/uL (ref 0.0–0.1)
Basophils Relative: 1 %
EOS ABS: 0.4 10*3/uL (ref 0.0–0.7)
Eosinophils Relative: 3 %
HCT: 20.7 % — ABNORMAL LOW (ref 39.0–52.0)
Hemoglobin: 7.1 g/dL — ABNORMAL LOW (ref 13.0–17.0)
Lymphocytes Relative: 17 %
Lymphs Abs: 2.1 10*3/uL (ref 0.7–4.0)
MCH: 30.7 pg (ref 26.0–34.0)
MCHC: 34.3 g/dL (ref 30.0–36.0)
MCV: 89.6 fL (ref 78.0–100.0)
MONO ABS: 2.1 10*3/uL — AB (ref 0.1–1.0)
Monocytes Relative: 17 %
NEUTROS ABS: 7.8 10*3/uL — AB (ref 1.7–7.7)
NRBC: 4 /100{WBCs} — AB
Neutrophils Relative %: 62 %
PLATELETS: 464 10*3/uL — AB (ref 150–400)
RBC: 2.31 MIL/uL — ABNORMAL LOW (ref 4.22–5.81)
RDW: 20.6 % — ABNORMAL HIGH (ref 11.5–15.5)
WBC: 12.5 10*3/uL — ABNORMAL HIGH (ref 4.0–10.5)

## 2015-10-30 LAB — COMPREHENSIVE METABOLIC PANEL
ALK PHOS: 88 U/L (ref 38–126)
ALT: 22 U/L (ref 17–63)
ANION GAP: 9 (ref 5–15)
AST: 40 U/L (ref 15–41)
Albumin: 4.3 g/dL (ref 3.5–5.0)
BUN: 16 mg/dL (ref 6–20)
CALCIUM: 8.9 mg/dL (ref 8.9–10.3)
CHLORIDE: 110 mmol/L (ref 101–111)
CO2: 22 mmol/L (ref 22–32)
Creatinine, Ser: 0.87 mg/dL (ref 0.61–1.24)
GFR calc non Af Amer: >60 mL/min (ref >60)
Glucose, Bld: 116 mg/dL — ABNORMAL HIGH (ref 65–99)
Potassium: 3.8 mmol/L (ref 3.5–5.1)
SODIUM: 141 mmol/L (ref 135–145)
Total Bilirubin: 5.8 mg/dL — ABNORMAL HIGH (ref 0.3–1.2)
Total Protein: 7.4 g/dL (ref 6.5–8.1)

## 2015-10-30 NOTE — Progress Notes (Signed)
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 Hb SS with crisis: Pt's Hb has remained stable but increased WBC as well as  Continue PCA and clinician assisted doses of Dilaudid and Toradol.  2. Hypoxemia: Hypoxemia improved. Pt was also without Ambrisentan for 3 weeks and I suspect that he his PAH has worsened since being off the medication. Additionally patient has had a weight increase of greater than 3#'s. Will ask nurse to give Torsemide as prescribed.  Will likely need to use oxygen until he is consistently taking Ambisentan. I do not believe that a transfusion will be of value as pt gets red cell Pheresis next week, and he is a this baseline Hb.  3. Leukocytosis: Pt has a chronic leukocytosis. There is no evidence of infection and I feel that the acute increase in WBC is likley related to increased bone marrrow activity.  4. Anemia of chronic disease: Hb at baseline.  5. Chronic pain: Continue MS Contin 15 mg  6. Pulmonary Hypertension: Continue Ambrisentan 7. Chronic Anticoagulation: continue Xarelto  Code Status: Full Code Family Communication: N/A Disposition Plan: Not yet ready for discharge  [facility]  Pager [phone]. If 7PM-7AM, please contact night-coverage.  10/30/2015, 6:45 AM  LOS: 3 days   Interim History: Patient reports feeling better. His pain continues to be in the right chest wall at an intensity of 6/10.   Consultants:  None  Procedures:  None  Antibiotics:  None   Objective: Vitals:   10/29/15 2354 10/30/15 0148 10/30/15 0400 10/30/15 0525  BP:  132/87  129/83  Pulse:  99  (!) 103  Resp: 20 (!) 22 (!) 21 (!) 22  Temp:  98.1 F (36.7 C)  99.1 F (37.3 C)  TempSrc:  Oral  Oral  SpO2: 90% 92% (!) 88% 94%  Weight:    80.9 kg (178 lb 5.6 oz)  Height:       Weight change: 0.1 kg (3.5  oz)  Intake/Output Summary (Last 24 hours) at 10/30/15 0645 Last data filed at 10/30/15 0631  Gross per 24 hour  Intake          2342.17 ml  Output             2416 ml  Net           -73.83 ml    General: Alert, awake, oriented x3, in no acute distress.  HEENT: [place]/AT [place]ER[place], EOMI. Mild icterus at baseline. Neck: Trachea midline,  no masses, no thyromegal,y no JVD, no carotid bruit OROPHARYNX:  Moist, No exudate/ erythema/lesions.  Heart: Regular rate and rhythm, without murmurs, rubs, gallops, PMI non-displaced, no heaves or thrills on palpation.  Lungs: Clear to auscultation, no wheezing or rhonchi noted. No increased vocal fremitus resonant to percussion.  Abdomen: Soft, nontender, nondistended, positive bowel sounds, no masses no hepatosplenomegaly noted.  Neuro: No focal neurological deficits noted cranial nerves II through XII grossly intact.  Strength at functional baseline in bilateral upper and lower extremities. Musculoskeletal: No warmth swelling or erythema around joints, no spinal tenderness noted. Psychiatric: Patient alert and oriented x3, good insight and cognition, good recent to remote recall.    Data Reviewed: Basic Metabolic Panel:  Recent Labs Lab 10/27/15 0705 10/28/15 0954  NA 139 140  K 3.4* 3.9  CL 112* 113*  CO2 20* 22  GLUCOSE 100* 111*  BUN 14 13  CREATININE 0.76 0.75  CALCIUM 8.7* 8.8*   Liver Function Tests:  Recent Labs Lab 10/27/15 0705  AST 30  ALT 20  ALKPHOS 81  BILITOT 6.1*  PROT 7.3  ALBUMIN 4.2   No results for input(s): LIPASE, AMYLASE in the last 168 hours. No results for input(s): AMMONIA in the last 168 hours. CBC:  Recent Labs Lab 10/27/15 0705 10/28/15 0954  WBC 12.7* 18.6*  NEUTROABS 8.5* 13.5*  HGB 7.6* 7.1*  HCT 22.1* 20.9*  MCV 87.7 89.7  PLT 490* 467*   Cardiac Enzymes: No results for input(s): CKTOTAL, CKMB, CKMBINDEX, TROPONINI in the last 168 hours. BNP (last 3 results)  Recent Labs   11/30/14 0910 08/02/15 2307  BNP 639.7* 503.2*    ProBNP (last 3 results) No results for input(s): PROBNP in the last 8760 hours.  CBG: No results for input(s): GLUCAP in the last 168 hours.  Recent Results (from the past 240 hour(s))  MRSA PCR Screening     Status: None   Collection Time: 10/28/15  7:34 PM  Result Value Ref Range Status   MRSA by PCR NEGATIVE NEGATIVE Final    Comment:        The GeneXpert MRSA Assay (FDA approved for NASAL specimens only), is one component of a comprehensive MRSA colonization surveillance program. It is not intended to diagnose MRSA infection nor to guide or monitor treatment for MRSA infections.      Studies: Dg Chest 2 View  Result Date: 10/28/2015 CLINICAL DATA:  Sickle cell crisis, bilateral flank pain, former smoker, history of hypertension. EXAM: CHEST  2 VIEW COMPARISON:  PA and lateral chest x-ray of October 27, 2015 FINDINGS: The lungs are adequately inflated. The interstitial markings remain increased. The pulmonary vascularity remains engorged and the cardiac silhouette remains enlarged. There is no alveolar pneumonia nor pleural effusion or pneumothorax. The power port catheter tip projects over the distal third of the SVC. There is multilevel degenerative disc disease of the thoracic spine. IMPRESSION: Stable cardiomegaly with mild pulmonary vascular prominence. No pneumonia nor other acute cardiopulmonary abnormality. Electronically Signed   By: David  Martinique M.D.   On: 10/28/2015 09:32   Dg Chest 2 View  Result Date: 10/27/2015 CLINICAL DATA:  Sickle cell pain. New mid to left lateral chest pain this morning. EXAM: CHEST  2 VIEW COMPARISON:  10/23/2015 FINDINGS: The cardiac silhouette is enlarged but stable in appearance. Left-sided port catheter tip terminates in the proximal right atrium, unchanged. No pneumonic consolidation nor overt pulmonary edema. Minimal subpleural scarring in the right upper lobe. No effusion or  pneumothorax. No suspicious osseous abnormalities. 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 Right upper quadrant surgical clips consistent with cholecystectomy IMPRESSION: Stable cardiomegaly without acute pulmonary disease. Electronically Signed   By: Ashley Royalty M.D.   On: 10/27/2015 08:10   Dg Chest 2 View  Result Date: 10/23/2015 CLINICAL DATA:  Tachypnea and hypoxia EXAM: CHEST  2 VIEW COMPARISON:  09/26/2015 FINDINGS: Chronic cardiomegaly. Porta catheter on the left with tip at the upper right atrium. Chronic interstitial coarsening with small scattered scar-like opacities. There is no edema, consolidation, effusion, or pneumothorax. Bilateral humeral head avascular necrosis. IMPRESSION: Stable from prior.  No evidence of acute disease. Electronically Signed   By: Monte Fantasia M.D.   On: 10/23/2015 14:27    Scheduled Meds: . ambrisentan  5 mg Oral QHS  . aspirin  81 mg Oral Daily  . cholecalciferol  2,000 Units Oral Daily  .  Deferasirox  1,080 mg Oral Daily  . folic acid  1 mg Oral q morning - 10a  . gabapentin  300 mg Oral TID  . HYDROmorphone   Intravenous Q4H  .  HYDROmorphone (DILAUDID) injection  2 mg Intravenous Q2H  . ketorolac  30 mg Intravenous Q6H  . morphine  30 mg Oral Q12H  . rivaroxaban  20 mg Oral Q supper  . senna-docusate  1 tablet Oral BID   Continuous Infusions: . sodium chloride 10 mL/hr at 10/29/15 1247      In excess of 25 minutes spent during this visit. Greater than 50% involved face to face contact with the patient for assessment, counseling and coordination of care.

## 2015-10-31 LAB — CBC
HEMATOCRIT: 19 % — AB (ref 39.0–52.0)
HEMOGLOBIN: 6.4 g/dL — AB (ref 13.0–17.0)
MCH: 31 pg (ref 26.0–34.0)
MCHC: 34.2 g/dL (ref 30.0–36.0)
MCV: 90.5 fL (ref 78.0–100.0)
Platelets: 440 10*3/uL — ABNORMAL HIGH (ref 150–400)
RBC: 2.1 MIL/uL — AB (ref 4.22–5.81)
RDW: 20.4 % — ABNORMAL HIGH (ref 11.5–15.5)
WBC: 11.9 10*3/uL — AB (ref 4.0–10.5)

## 2015-10-31 MED ORDER — HYDROMORPHONE HCL 2 MG/ML IJ SOLN
2.0000 mg | Freq: Once | INTRAMUSCULAR | Status: AC
  Administered 2015-10-31: 2 mg via INTRAVENOUS
  Filled 2015-10-31: qty 1

## 2015-10-31 MED ORDER — HYDROMORPHONE HCL 2 MG/ML IJ SOLN
2.0000 mg | INTRAMUSCULAR | Status: DC | PRN
  Administered 2015-10-31 – 2015-11-02 (×12): 2 mg via INTRAVENOUS
  Filled 2015-10-31 (×10): qty 1

## 2015-10-31 NOTE — Progress Notes (Signed)
Nursing Note: Desats in 75 s but refuses to wear Oxygen or Co2 monitor.PCA off and will monitor closely.wbb

## 2015-10-31 NOTE — Progress Notes (Signed)
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 Hb SS with crisis: Pt's Hb has dropped to 6.5 today. He has been drowsy. Will decrease his Physician assisted dosing then. 2. Hypoxemia: Hypoxemia improved. Continue his Ambrisentan. I do not believe that a transfusion will be of value as pt gets red cell Pheresis next week, and he is a this baseline Hb. If hemoglobin drops further I will transfuse. 3. Leukocytosis: Pt has a chronic leukocytosis. There is no evidence of infection and I feel that the acute increase in WBC is likley related to increased bone marrrow activity.  4. Anemia of chronic disease: Hb at baseline.  5. Chronic pain: Continue MS Contin 15 mg  6. Pulmonary Hypertension: Continue Ambrisentan 7. Chronic Anticoagulation: continue Xarelto  Code Status: Full Code Family Communication: N/A Disposition Plan: Not yet ready for discharge  [facility]  Pager [phone]. If 7PM-7AM, please contact night-coverage.  10/31/2015, 7:53 AM  LOS: 4 days   Interim History: Patient reports feeling better. His pain continues to be in the right chest wall at an intensity of 6/10. He has been drowsy today.  Consultants:  None  Procedures:  None  Antibiotics:  None   Objective: Vitals:   10/31/15 0000 10/31/15 0156 10/31/15 0400 10/31/15 0642  BP:  130/86  104/74  Pulse:  (!) 107  77  Resp: 16 20 14 14   Temp:  98.4 F (36.9 C)  97.8 F (36.6 C)  TempSrc:  Oral  Oral  SpO2: 91% 92% 91% 92%  Weight:    82.4 kg (181 lb 10.5 oz)  Height:       Weight change: 1.5 kg (3 lb 4.9 oz)  Intake/Output Summary (Last 24 hours) at 10/31/15 0753 Last data filed at 10/31/15 0215  Gross per 24 hour  Intake              400 ml  Output              950 ml  Net             -550 ml    General: Alert, awake, oriented x3, in no acute distress.   HEENT: [place]/AT PEERL, EOMI. Mild icterus at baseline. Neck: Trachea midline,  no masses, no thyromegal,y no JVD, no carotid bruit OROPHARYNX:  Moist, No exudate/ erythema/lesions.  Heart: Regular rate and rhythm, without murmurs, rubs, gallops, PMI non-displaced, no heaves or thrills on palpation.  Lungs: Clear to auscultation, no wheezing or rhonchi noted. No increased vocal fremitus resonant to percussion.  Abdomen: Soft, nontender, nondistended, positive bowel sounds, no masses no hepatosplenomegaly noted.  Neuro: No focal neurological deficits noted cranial nerves II through XII grossly intact.  Strength at functional baseline in bilateral upper and lower extremities. Musculoskeletal: No warmth swelling or erythema around joints, no spinal tenderness noted. Psychiatric: Patient alert and oriented x3, good insight and cognition, good recent to remote recall.    Data Reviewed: Basic Metabolic Panel:  Recent Labs Lab 10/27/15 0705 10/28/15 0954 10/30/15 0700  NA 139 140 141  K 3.4* 3.9 3.8  CL 112* 113* 110  CO2 20* 22 22  GLUCOSE 100* 111* 116*  BUN 14 13 16   CREATININE 0.76 0.75 0.87  CALCIUM 8.7* 8.8* 8.9   Liver Function Tests:  Recent Labs Lab 10/27/15 0705 10/30/15 0700  AST 30 40  ALT 20 22  ALKPHOS  81 88  BILITOT 6.1* 5.8*  PROT 7.3 7.4  ALBUMIN 4.2 4.3   No results for input(s): LIPASE, AMYLASE in the last 168 hours. No results for input(s): AMMONIA in the last 168 hours. CBC:  Recent Labs Lab 10/27/15 0705 10/28/15 0954 10/30/15 0700  WBC 12.7* 18.6* 12.5*  NEUTROABS 8.5* 13.5* 7.8*  HGB 7.6* 7.1* 7.1*  HCT 22.1* 20.9* 20.7*  MCV 87.7 89.7 89.6  PLT 490* 467* 464*   Cardiac Enzymes: No results for input(s): CKTOTAL, CKMB, CKMBINDEX, TROPONINI in the last 168 hours. BNP (last 3 results)  Recent Labs  11/30/14 0910 08/02/15 2307  BNP 639.7* 503.2*    ProBNP (last 3 results) No results for input(s): PROBNP in the last 8760  hours.  CBG: No results for input(s): GLUCAP in the last 168 hours.  Recent Results (from the past 240 hour(s))  MRSA PCR Screening     Status: None   Collection Time: 10/28/15  7:34 PM  Result Value Ref Range Status   MRSA by PCR NEGATIVE NEGATIVE Final    Comment:        The GeneXpert MRSA Assay (FDA approved for NASAL specimens only), is one component of a comprehensive MRSA colonization surveillance program. It is not intended to diagnose MRSA infection nor to guide or monitor treatment for MRSA infections.      Studies: Dg Chest 2 View  Result Date: 10/28/2015 CLINICAL DATA:  Sickle cell crisis, bilateral flank pain, former smoker, history of hypertension. EXAM: CHEST  2 VIEW COMPARISON:  PA and lateral chest x-ray of October 27, 2015 FINDINGS: The lungs are adequately inflated. The interstitial markings remain increased. The pulmonary vascularity remains engorged and the cardiac silhouette remains enlarged. There is no alveolar pneumonia nor pleural effusion or pneumothorax. The power port catheter tip projects over the distal third of the SVC. There is multilevel degenerative disc disease of the thoracic spine. IMPRESSION: Stable cardiomegaly with mild pulmonary vascular prominence. No pneumonia nor other acute cardiopulmonary abnormality. Electronically Signed   By: David  Martinique M.D.   On: 10/28/2015 09:32   Dg Chest 2 View  Result Date: 10/27/2015 CLINICAL DATA:  Sickle cell pain. New mid to left lateral chest pain this morning. EXAM: CHEST  2 VIEW COMPARISON:  10/23/2015 FINDINGS: The cardiac silhouette is enlarged but stable in appearance. Left-sided port catheter tip terminates in the proximal right atrium, unchanged. No pneumonic consolidation nor overt pulmonary edema. Minimal subpleural scarring in the right upper lobe. No effusion or pneumothorax. No suspicious osseous abnormalities. 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 Right upper  quadrant surgical clips consistent with cholecystectomy IMPRESSION: Stable cardiomegaly without acute pulmonary disease. Electronically Signed   By: Ashley Royalty M.D.   On: 10/27/2015 08:10   Dg Chest 2 View  Result Date: 10/23/2015 CLINICAL DATA:  Tachypnea and hypoxia EXAM: CHEST  2 VIEW COMPARISON:  09/26/2015 FINDINGS: Chronic cardiomegaly. Porta catheter on the left with tip at the upper right atrium. Chronic interstitial coarsening with small scattered scar-like opacities. There is no edema, consolidation, effusion, or pneumothorax. Bilateral humeral head avascular necrosis. IMPRESSION: Stable from prior.  No evidence of acute disease. Electronically Signed   By: Monte Fantasia M.D.   On: 10/23/2015 14:27    Scheduled Meds: . ambrisentan  5 mg Oral QHS  . aspirin  81 mg Oral Daily  . cholecalciferol  2,000 Units Oral Daily  . Deferasirox  1,080 mg Oral Daily  . folic acid  1 mg  Oral q morning - 10a  . gabapentin  300 mg Oral TID  . HYDROmorphone   Intravenous Q4H  . ketorolac  30 mg Intravenous Q6H  . morphine  30 mg Oral Q12H  . rivaroxaban  20 mg Oral Q supper  . senna-docusate  1 tablet Oral BID   Continuous Infusions: . sodium chloride 10 mL/hr at 10/29/15 1247      In excess of 25 minutes spent during this visit. Greater than 50% involved face to face contact with the patient for assessment, counseling and coordination of care.

## 2015-10-31 NOTE — Progress Notes (Addendum)
Nursing Note: Dr. Jonelle Sidle  At the bedside and aware of events of last night.wbb

## 2015-10-31 NOTE — Progress Notes (Signed)
Nursing Note: Pt standing and attempted to walk across the room.Reminded pt of IV and that he was at risk to pull his IV apart and pt returned to bed.wbb

## 2015-10-31 NOTE — Progress Notes (Signed)
Nursing Note: Restless.Up repeatedly tucking in straightening covers.A: Checked vitals T-98.4 P-107 R-20 BP-130/86 PO2 92% on 4L n/c.Pt often removes n/c and snesor w/ pca and alarms sounds frequently.Pt replaces once reminded.Pt restless but half asleep and removes it in his sleep.wbb

## 2015-10-31 NOTE — Progress Notes (Signed)
CRITICAL VALUE ALERT  Critical value received:  *Hgb 6.4   Date of notification:  9/30  Time of notification:  0900  Critical value read back: yes   Nurse who received alert:  Kirkland Hun  MD notified (1st page):  Jonelle Sidle  Time of first page:  0945  MD notified (2nd page):  Time of second page:  Responding MD:  Jonelle Sidle  Time MD responded:  [phone]

## 2015-11-01 LAB — CBC
HEMATOCRIT: 15 % — AB (ref 39.0–52.0)
Hemoglobin: 5.1 g/dL — CL (ref 13.0–17.0)
MCH: 30.7 pg (ref 26.0–34.0)
MCHC: 34 g/dL (ref 30.0–36.0)
MCV: 90.4 fL (ref 78.0–100.0)
PLATELETS: 352 10*3/uL (ref 150–400)
RBC: 1.66 MIL/uL — ABNORMAL LOW (ref 4.22–5.81)
RDW: 20.5 % — AB (ref 11.5–15.5)
WBC: 9.5 10*3/uL (ref 4.0–10.5)

## 2015-11-01 LAB — PREPARE RBC (CROSSMATCH)

## 2015-11-01 MED ORDER — SODIUM CHLORIDE 0.9 % IV SOLN: Freq: Once | INTRAVENOUS | Status: AC

## 2015-11-01 MED ORDER — SODIUM CHLORIDE 0.9 % IV SOLN
Freq: Once | INTRAVENOUS | Status: AC
  Administered 2015-11-01: 20:00:00 via INTRAVENOUS

## 2015-11-01 MED ORDER — IBUPROFEN 800 MG PO TABS
800.0000 mg | ORAL_TABLET | Freq: Four times a day (QID) | ORAL | Status: DC
  Administered 2015-11-01 – 2015-11-03 (×9): 800 mg via ORAL
  Filled 2015-11-01 (×9): qty 1

## 2015-11-01 MED ORDER — HYDROMORPHONE HCL 2 MG/ML IJ SOLN
2.0000 mg | INTRAMUSCULAR | Status: DC | PRN
Start: 2015-11-01 — End: 2015-11-02
  Filled 2015-11-01 (×2): qty 1

## 2015-11-01 MED ORDER — HYDROMORPHONE HCL 4 MG PO TABS
4.0000 mg | ORAL_TABLET | ORAL | Status: DC
  Administered 2015-11-01 – 2015-11-03 (×13): 4 mg via ORAL
  Filled 2015-11-01 (×13): qty 1

## 2015-11-01 NOTE — Progress Notes (Signed)
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 Hb SS with crisis: Pt's Hb has dropped to 5.1 g today.He is complaining of 7/10 pain. Will restart his Physician assisted dosing at 2 mg Q 3 hours. Monitor response. Continue PCA. 2. Hypoxemia: On Oxygen.  Hypoxemia improved. Continue his Ambrisentan.  3. Leukocytosis: Pt has a chronic leukocytosis. WBC is improving.  4. Anemia of chronic disease: Hb has dropped below baseline. No evidence of bleed. Appears to be due to hemolytic crisis. Will transfuse 1 unit PRBC today..  5. Chronic pain: Continue MS Contin 15 mg  6. Pulmonary Hypertension: Continue Ambrisentan 7. Chronic Anticoagulation: continue Xarelto  Code Status: Full Code Family Communication: N/A Disposition Plan: Not yet ready for discharge  [facility]  Pager [phone]. If 7PM-7AM, please contact night-coverage.  11/01/2015, 1:03 PM  LOS: 5 days   Interim History: Patient reports feeling better. His pain continues to be in the right chest wall at an intensity of 6/10. He has been drowsy today.  Consultants:  None  Procedures:  None  Antibiotics:  None   Objective: Vitals:   11/01/15 0629 11/01/15 0801 11/01/15 1015 11/01/15 1211  BP: (!) 105/56  121/84   Pulse: 72  70   Resp: 12 13 12 13   Temp: 98.6 F (37 C)  98.8 F (37.1 C)   TempSrc: Oral  Oral   SpO2: 92% 91% 98% 95%  Weight:      Height:       Weight change: 0.155 kg (5.5 oz)  Intake/Output Summary (Last 24 hours) at 11/01/15 1303 Last data filed at 11/01/15 1000  Gross per 24 hour  Intake              840 ml  Output             1575 ml  Net             -735 ml    General: Alert, awake, oriented x3, in no acute distress.  HEENT: [place]/AT PEERL, EOMI. Mild icterus at baseline. Neck: Trachea midline,  no masses, no thyromegal,y no JVD, no carotid  bruit OROPHARYNX:  Moist, No exudate/ erythema/lesions.  Heart: Regular rate and rhythm, without murmurs, rubs, gallops, PMI non-displaced, no heaves or thrills on palpation.  Lungs: Clear to auscultation, no wheezing or rhonchi noted. No increased vocal fremitus resonant to percussion.  Abdomen: Soft, nontender, nondistended, positive bowel sounds, no masses no hepatosplenomegaly noted.  Neuro: No focal neurological deficits noted cranial nerves II through XII grossly intact.  Strength at functional baseline in bilateral upper and lower extremities. Musculoskeletal: No warmth swelling or erythema around joints, no spinal tenderness noted. Psychiatric: Patient alert and oriented x3, good insight and cognition, good recent to remote recall.    Data Reviewed: Basic Metabolic Panel:  Recent Labs Lab 10/27/15 0705 10/28/15 0954 10/30/15 0700  NA 139 140 141  K 3.4* 3.9 3.8  CL 112* 113* 110  CO2 20* 22 22  GLUCOSE 100* 111* 116*  BUN 14 13 16   CREATININE 0.76 0.75 0.87  CALCIUM 8.7* 8.8* 8.9   Liver Function Tests:  Recent Labs Lab 10/27/15 0705 10/30/15 0700  AST 30 40  ALT 20 22  ALKPHOS 81 88  BILITOT 6.1* 5.8*  PROT 7.3 7.4  ALBUMIN 4.2 4.3   No results for input(s): LIPASE, AMYLASE in the last 168 hours.  No results for input(s): AMMONIA in the last 168 hours. CBC:  Recent Labs Lab 10/27/15 0705 10/28/15 0954 10/30/15 0700 10/31/15 0829  WBC 12.7* 18.6* 12.5* 11.9*  NEUTROABS 8.5* 13.5* 7.8*  --   HGB 7.6* 7.1* 7.1* 6.4*  HCT 22.1* 20.9* 20.7* 19.0*  MCV 87.7 89.7 89.6 90.5  PLT 490* 467* 464* 440*   Cardiac Enzymes: No results for input(s): CKTOTAL, CKMB, CKMBINDEX, TROPONINI in the last 168 hours. BNP (last 3 results)  Recent Labs  11/30/14 0910 08/02/15 2307  BNP 639.7* 503.2*    ProBNP (last 3 results) No results for input(s): PROBNP in the last 8760 hours.  CBG: No results for input(s): GLUCAP in the last 168 hours.  Recent Results (from  the past 240 hour(s))  MRSA PCR Screening     Status: None   Collection Time: 10/28/15  7:34 PM  Result Value Ref Range Status   MRSA by PCR NEGATIVE NEGATIVE Final    Comment:        The GeneXpert MRSA Assay (FDA approved for NASAL specimens only), is one component of a comprehensive MRSA colonization surveillance program. It is not intended to diagnose MRSA infection nor to guide or monitor treatment for MRSA infections.      Studies: Dg Chest 2 View  Result Date: 10/28/2015 CLINICAL DATA:  Sickle cell crisis, bilateral flank pain, former smoker, history of hypertension. EXAM: CHEST  2 VIEW COMPARISON:  PA and lateral chest x-ray of October 27, 2015 FINDINGS: The lungs are adequately inflated. The interstitial markings remain increased. The pulmonary vascularity remains engorged and the cardiac silhouette remains enlarged. There is no alveolar pneumonia nor pleural effusion or pneumothorax. The power port catheter tip projects over the distal third of the SVC. There is multilevel degenerative disc disease of the thoracic spine. IMPRESSION: Stable cardiomegaly with mild pulmonary vascular prominence. No pneumonia nor other acute cardiopulmonary abnormality. Electronically Signed   By: David  Martinique M.D.   On: 10/28/2015 09:32   Dg Chest 2 View  Result Date: 10/27/2015 CLINICAL DATA:  Sickle cell pain. New mid to left lateral chest pain this morning. EXAM: CHEST  2 VIEW COMPARISON:  10/23/2015 FINDINGS: The cardiac silhouette is enlarged but stable in appearance. Left-sided port catheter tip terminates in the proximal right atrium, unchanged. No pneumonic consolidation nor overt pulmonary edema. Minimal subpleural scarring in the right upper lobe. No effusion or pneumothorax. No suspicious osseous abnormalities. 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 Right upper quadrant surgical clips consistent with cholecystectomy IMPRESSION: Stable cardiomegaly without  acute pulmonary disease. Electronically Signed   By: Ashley Royalty M.D.   On: 10/27/2015 08:10   Dg Chest 2 View  Result Date: 10/23/2015 CLINICAL DATA:  Tachypnea and hypoxia EXAM: CHEST  2 VIEW COMPARISON:  09/26/2015 FINDINGS: Chronic cardiomegaly. Porta catheter on the left with tip at the upper right atrium. Chronic interstitial coarsening with small scattered scar-like opacities. There is no edema, consolidation, effusion, or pneumothorax. Bilateral humeral head avascular necrosis. IMPRESSION: Stable from prior.  No evidence of acute disease. Electronically Signed   By: Monte Fantasia M.D.   On: 10/23/2015 14:27    Scheduled Meds: . ambrisentan  5 mg Oral QHS  . aspirin  81 mg Oral Daily  . cholecalciferol  2,000 Units Oral Daily  . Deferasirox  1,080 mg Oral Daily  . folic acid  1 mg Oral q morning - 10a  . gabapentin  300 mg Oral TID  . HYDROmorphone  Intravenous Q4H  . morphine  30 mg Oral Q12H  . rivaroxaban  20 mg Oral Q supper  . senna-docusate  1 tablet Oral BID   Continuous Infusions: . sodium chloride 10 mL/hr at 10/31/15 1731      In excess of 25 minutes spent during this visit. Greater than 50% involved face to face contact with the patient for assessment, counseling and coordination of care.

## 2015-11-01 NOTE — Plan of Care (Signed)
Problem: Pain Managment: Goal: General experience of comfort will improve Outcome: Not Progressing Pt states his pain has increased since decrease in frequency of prn pain meds.

## 2015-11-02 LAB — CBC WITH DIFFERENTIAL/PLATELET
BASOS ABS: 0.1 10*3/uL (ref 0.0–0.1)
BASOS PCT: 1 %
EOS ABS: 0.7 10*3/uL (ref 0.0–0.7)
EOS PCT: 6 %
HEMATOCRIT: 22.1 % — AB (ref 39.0–52.0)
Hemoglobin: 7.5 g/dL — ABNORMAL LOW (ref 13.0–17.0)
Lymphocytes Relative: 18 %
Lymphs Abs: 2.1 10*3/uL (ref 0.7–4.0)
MCH: 30.1 pg (ref 26.0–34.0)
MCHC: 33.5 g/dL (ref 30.0–36.0)
MCV: 89.8 fL (ref 78.0–100.0)
MONO ABS: 1.7 10*3/uL — AB (ref 0.1–1.0)
Monocytes Relative: 15 %
NEUTROS ABS: 7.1 10*3/uL (ref 1.7–7.7)
Neutrophils Relative %: 60 %
PLATELETS: 473 10*3/uL — AB (ref 150–400)
RBC: 2.46 MIL/uL — ABNORMAL LOW (ref 4.22–5.81)
RDW: 19.7 % — AB (ref 11.5–15.5)
WBC: 11.8 10*3/uL — ABNORMAL HIGH (ref 4.0–10.5)

## 2015-11-02 LAB — RETICULOCYTES
RBC.: 2.46 MIL/uL — AB (ref 4.22–5.81)
RETIC COUNT ABSOLUTE: 228.8 10*3/uL — AB (ref 19.0–186.0)
RETIC CT PCT: 9.3 % — AB (ref 0.4–3.1)

## 2015-11-02 LAB — LACTATE DEHYDROGENASE: LDH: 362 U/L — ABNORMAL HIGH (ref 98–192)

## 2015-11-02 MED ORDER — FUROSEMIDE 10 MG/ML IJ SOLN
40.0000 mg | Freq: Once | INTRAMUSCULAR | Status: AC
  Administered 2015-11-02: 40 mg via INTRAVENOUS
  Filled 2015-11-02: qty 4

## 2015-11-02 MED ORDER — TORSEMIDE 20 MG PO TABS
20.0000 mg | ORAL_TABLET | Freq: Every day | ORAL | Status: DC
  Administered 2015-11-03: 20 mg via ORAL
  Filled 2015-11-02: qty 1

## 2015-11-02 NOTE — Progress Notes (Signed)
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 Hypoxemia: Hypoxemia persists. Pt has had a weight gain of >3 #'s in the last 72 hours. Will give a dose of lasix and schedule Torsemide daily starting tomorrow. Continue Ambisentan.  2. Hb SS with crisis: Continue PCA and discontinue clinician assisted doses.. Anticipate discharge home tomorrow.  3. Leukocytosis: Pt has a chronic leukocytosis. There is no evidence of infection and I feel that the acute increase in WBC is likley related to increased bone marrrow activity.  4. Anemia of chronic disease: Hb at baseline post transfusion of 1 unit RBC's.  5. Chronic pain: Continue MS Contin 30 mg BID 6. Pulmonary Hypertension: Continue Ambrisentan 7. Chronic Anticoagulation: continue Xarelto  Code Status: Full Code Family Communication: N/A Disposition Plan: Anticipate discharge home tomorrow.  [name] A.  Pager [phone]. If 7PM-7AM, please contact night-coverage.  11/02/2015, 3:14 PM  LOS: 6 days   Interim History: Patient reports that his pain is at an intensity of 6/10 and localized to ribs.   Consultants:  None  Procedures:  None  Antibiotics:  None   Objective: Vitals:   11/02/15 0742 11/02/15 1026 11/02/15 1316 11/02/15 1326  BP:  121/88  115/79  Pulse:  76  66  Resp: 13 15 14 14   Temp:  98.8 F (37.1 C)  98.5 F (36.9 C)  TempSrc:  Oral  Oral  SpO2: 92% 93% 91% 94%  Weight:      Height:       Weight change: -0.355 kg (-12.5 oz)  Intake/Output Summary (Last 24 hours) at 11/02/15 1514 Last data filed at 11/02/15 1326  Gross per 24 hour  Intake           1396.6 ml  Output             1950 ml  Net           -553.4 ml    General: Alert, awake, oriented x3, in no acute distress.  HEENT: [place]/AT [place]ER[place], EOMI. Mild icterus at baseline. Heart: Regular  rate and rhythm, without murmurs, rubs, gallops, PMI non-displaced, no heaves or thrills on palpation.  Lungs: Clear to auscultation, no wheezing or rhonchi noted. No increased vocal fremitus resonant to percussion.  Abdomen: Soft, nontender, nondistended, positive bowel sounds, no masses no hepatosplenomegaly noted.  Neuro: No focal neurological deficits noted cranial nerves II through XII grossly intact.  Strength at functional baseline in bilateral upper and lower extremities. Musculoskeletal: No warmth swelling or erythema around joints, no spinal tenderness noted. Psychiatric: Patient alert and oriented x3, good insight and cognition, good recent to remote recall.    Data Reviewed: Basic Metabolic Panel:  Recent Labs Lab 10/27/15 0705 10/28/15 0954 10/30/15 0700  NA 139 140 141  K 3.4* 3.9 3.8  CL 112* 113* 110  CO2 20* 22 22  GLUCOSE 100* 111* 116*  BUN 14 13 16   CREATININE 0.76 0.75 0.87  CALCIUM 8.7* 8.8* 8.9   Liver Function Tests:  Recent Labs Lab 10/27/15 0705 10/30/15 0700  AST 30 40  ALT 20 22  ALKPHOS 81 88  BILITOT 6.1* 5.8*  PROT 7.3 7.4  ALBUMIN 4.2 4.3   No results for input(s): LIPASE, AMYLASE in the last 168 hours. No results for input(s): AMMONIA in the last 168 hours. CBC:  Recent Labs Lab 10/27/15 0705 10/28/15 0954 10/30/15 0700  10/31/15 0829 11/01/15 1347 11/02/15 1310  WBC 12.7* 18.6* 12.5* 11.9* 9.5 11.8*  NEUTROABS 8.5* 13.5* 7.8*  --   --  7.1  HGB 7.6* 7.1* 7.1* 6.4* 5.1* 7.5*  HCT 22.1* 20.9* 20.7* 19.0* 15.0* 22.1*  MCV 87.7 89.7 89.6 90.5 90.4 89.8  PLT 490* 467* 464* 440* 352 473*   Cardiac Enzymes: No results for input(s): CKTOTAL, CKMB, CKMBINDEX, TROPONINI in the last 168 hours. BNP (last 3 results)  Recent Labs  11/30/14 0910 08/02/15 2307  BNP 639.7* 503.2*    ProBNP (last 3 results) No results for input(s): PROBNP in the last 8760 hours.  CBG: No results for input(s): GLUCAP in the last 168  hours.  Recent Results (from the past 240 hour(s))  MRSA PCR Screening     Status: None   Collection Time: 10/28/15  7:34 PM  Result Value Ref Range Status   MRSA by PCR NEGATIVE NEGATIVE Final    Comment:        The GeneXpert MRSA Assay (FDA approved for NASAL specimens only), is one component of a comprehensive MRSA colonization surveillance program. It is not intended to diagnose MRSA infection nor to guide or monitor treatment for MRSA infections.      Studies: Dg Chest 2 View  Result Date: 10/28/2015 CLINICAL DATA:  Sickle cell crisis, bilateral flank pain, former smoker, history of hypertension. EXAM: CHEST  2 VIEW COMPARISON:  PA and lateral chest x-ray of October 27, 2015 FINDINGS: The lungs are adequately inflated. The interstitial markings remain increased. The pulmonary vascularity remains engorged and the cardiac silhouette remains enlarged. There is no alveolar pneumonia nor pleural effusion or pneumothorax. The power port catheter tip projects over the distal third of the SVC. There is multilevel degenerative disc disease of the thoracic spine. IMPRESSION: Stable cardiomegaly with mild pulmonary vascular prominence. No pneumonia nor other acute cardiopulmonary abnormality. Electronically Signed   By: David  Martinique M.D.   On: 10/28/2015 09:32   Dg Chest 2 View  Result Date: 10/27/2015 CLINICAL DATA:  Sickle cell pain. New mid to left lateral chest pain this morning. EXAM: CHEST  2 VIEW COMPARISON:  10/23/2015 FINDINGS: The cardiac silhouette is enlarged but stable in appearance. Left-sided port catheter tip terminates in the proximal right atrium, unchanged. No pneumonic consolidation nor overt pulmonary edema. Minimal subpleural scarring in the right upper lobe. No effusion or pneumothorax. No suspicious osseous abnormalities. 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 Right upper quadrant surgical clips consistent with cholecystectomy IMPRESSION:  Stable cardiomegaly without acute pulmonary disease. Electronically Signed   By: Ashley Royalty M.D.   On: 10/27/2015 08:10   Dg Chest 2 View  Result Date: 10/23/2015 CLINICAL DATA:  Tachypnea and hypoxia EXAM: CHEST  2 VIEW COMPARISON:  09/26/2015 FINDINGS: Chronic cardiomegaly. Porta catheter on the left with tip at the upper right atrium. Chronic interstitial coarsening with small scattered scar-like opacities. There is no edema, consolidation, effusion, or pneumothorax. Bilateral humeral head avascular necrosis. IMPRESSION: Stable from prior.  No evidence of acute disease. Electronically Signed   By: Monte Fantasia M.D.   On: 10/23/2015 14:27    Scheduled Meds: . ambrisentan  5 mg Oral QHS  . aspirin  81 mg Oral Daily  . cholecalciferol  2,000 Units Oral Daily  . Deferasirox  1,080 mg Oral Daily  . folic acid  1 mg Oral q morning - 10a  . furosemide  40 mg Intravenous Once  . gabapentin  300 mg Oral  TID  . HYDROmorphone   Intravenous Q4H  . HYDROmorphone  4 mg Oral Q4H  . ibuprofen  800 mg Oral QID  . morphine  30 mg Oral Q12H  . rivaroxaban  20 mg Oral Q supper  . senna-docusate  1 tablet Oral BID  . [START ON 11/03/2015] torsemide  20 mg Oral Daily   Continuous Infusions: . sodium chloride 10 mL/hr at 10/31/15 1731      In excess of 25 minutes spent during this visit. Greater than 50% involved face to face contact with the patient for assessment, counseling and coordination of care.

## 2015-11-03 DIAGNOSIS — Z9114 Patient's other noncompliance with medication regimen: Secondary | ICD-10-CM

## 2015-11-03 DIAGNOSIS — I272 Pulmonary hypertension, unspecified: Secondary | ICD-10-CM

## 2015-11-03 DIAGNOSIS — J9601 Acute respiratory failure with hypoxia: Secondary | ICD-10-CM

## 2015-11-03 MED ORDER — HEPARIN SOD (PORK) LOCK FLUSH 100 UNIT/ML IV SOLN
500.0000 [IU] | INTRAVENOUS | Status: AC | PRN
  Administered 2015-11-03: 500 [IU]
  Filled 2015-11-03: qty 5

## 2015-11-03 NOTE — Discharge Summary (Signed)
AMIRR LIKES MRN: [id number] DOB/AGE: July 04, 1980 35 y.o.  Admit date: 10/27/2015 Discharge date: 11/03/2015  Primary Care Physician:  [name], MD   Discharge Diagnoses:   Patient Active Problem List   Diagnosis Date Noted  . Cor pulmonale, chronic ([place]) 08/25/2014    Priority: High  . Pulmonary HTN 06/18/2013    Priority: High  . Functional asplenia     Priority: High  . Anemia of chronic disease 06/25/2014    Priority: Medium  . Hb-SS disease with crisis ([place]) 01/22/2014    Priority: Medium  . Chronic anticoagulation 08/22/2013    Priority: Medium  . Avascular necrosis ([place])     Priority: Low  . Sickle cell crisis ([place]) 10/27/2015  . Sickle cell pain crisis ([place]) 09/18/2015  . Sickle cell anemia with crisis ([place]) 09/04/2015  . Shortness of breath   . Pulmonary embolism without acute cor pulmonale ([place])   . Fever   . Sickle cell anemia with pain ([place]) 05/07/2015  . Hypoxia   . Anemia 01/31/2015  . Symptomatic anemia   . Hb-SS disease without crisis ([place]) 10/07/2014  . PVC's (premature ventricular contractions) 09/10/2014  . Abnormal EKG 09/10/2014  . Chronic atrial fibrillation ([place]) 09/09/2014  . Sickle cell anemia ([place]) 06/25/2014  . PAH (pulmonary artery hypertension) 03/18/2014  . Paralytic strabismus, external ophthalmoplegia   . Chronic pain syndrome 12/12/2013  . Essential hypertension 08/22/2013  . Vitamin D deficiency 02/13/2013  . Hx of pulmonary embolus 06/29/2012  . Hemochromatosis 12/14/2011    DISCHARGE MEDICATION:   Medication List    TAKE these medications   ambrisentan 5 MG tablet Commonly known as:  LETAIRIS Take 5 mg by mouth at bedtime.   aspirin 81 MG chewable tablet Chew 1 tablet (81 mg total) by mouth daily.   folic acid 1 MG tablet Commonly known as:  FOLVITE Take 1 tablet (1 mg total) by mouth every morning.   gabapentin 300 MG capsule Commonly known as:  NEURONTIN Take 1 capsule (300 mg total) by mouth 3  (three) times daily.   HYDROmorphone 4 MG tablet Commonly known as:  DILAUDID Take 1 tablet (4 mg total) by mouth every 4 (four) hours as needed for severe pain.   JADENU 360 MG Tabs Generic drug:  Deferasirox Take 1,080 mg by mouth daily.   lisinopril 5 MG tablet Commonly known as:  PRINIVIL,ZESTRIL Take 1 tablet (5 mg total) by mouth daily.   morphine 30 MG 12 hr tablet Commonly known as:  MS CONTIN Take 30 mg by mouth every 12 (twelve) hours. What changed:  Another medication with the same name was removed. Continue taking this medication, and follow the directions you see here.   potassium chloride SA 20 MEQ tablet Commonly known as:  K-DUR,KLOR-CON Take 1 tablet (20 mEq total) by mouth every morning.   rivaroxaban 20 MG Tabs tablet Commonly known as:  XARELTO Take 20 mg by mouth daily.   torsemide 20 MG tablet Commonly known as:  DEMADEX Take 20 mg by mouth daily as needed. For increase in patient weight greater than 3 lbs.   Vitamin D 2000 units tablet Take 1 tablet (2,000 Units total) by mouth daily.   zolpidem 10 MG tablet Commonly known as:  AMBIEN Take 1 tablet (10 mg total) by mouth at bedtime as needed for sleep.         Consults:    SIGNIFICANT DIAGNOSTIC STUDIES:  Dg Chest 2 View  Result Date: 10/28/2015 CLINICAL DATA:  Sickle cell crisis, bilateral flank pain, former smoker, history of hypertension. EXAM: CHEST  2 VIEW COMPARISON:  PA and lateral chest x-ray of October 27, 2015 FINDINGS: The lungs are adequately inflated. The interstitial markings remain increased. The pulmonary vascularity remains engorged and the cardiac silhouette remains enlarged. There is no alveolar pneumonia nor pleural effusion or pneumothorax. The power port catheter tip projects over the distal third of the SVC. There is multilevel degenerative disc disease of the thoracic spine. IMPRESSION: Stable cardiomegaly with mild pulmonary vascular prominence. No pneumonia nor other  acute cardiopulmonary abnormality. Electronically Signed   By: David  Martinique M.D.   On: 10/28/2015 09:32   Dg Chest 2 View  Result Date: 10/27/2015 CLINICAL DATA:  Sickle cell pain. New mid to left lateral chest pain this morning. EXAM: CHEST  2 VIEW COMPARISON:  10/23/2015 FINDINGS: The cardiac silhouette is enlarged but stable in appearance. Left-sided port catheter tip terminates in the proximal right atrium, unchanged. No pneumonic consolidation nor overt pulmonary edema. Minimal subpleural scarring in the right upper lobe. No effusion or pneumothorax. No suspicious osseous abnormalities. 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 Right upper quadrant surgical clips consistent with cholecystectomy IMPRESSION: Stable cardiomegaly without acute pulmonary disease. Electronically Signed   By: Ashley Royalty M.D.   On: 10/27/2015 08:10   Dg Chest 2 View  Result Date: 10/23/2015 CLINICAL DATA:  Tachypnea and hypoxia EXAM: CHEST  2 VIEW COMPARISON:  09/26/2015 FINDINGS: Chronic cardiomegaly. Porta catheter on the left with tip at the upper right atrium. Chronic interstitial coarsening with small scattered scar-like opacities. There is no edema, consolidation, effusion, or pneumothorax. Bilateral humeral head avascular necrosis. IMPRESSION: Stable from prior.  No evidence of acute disease. Electronically Signed   By: Monte Fantasia M.D.   On: 10/23/2015 14:27        Recent Results (from the past 240 hour(s))  MRSA PCR Screening     Status: None   Collection Time: 10/28/15  7:34 PM  Result Value Ref Range Status   MRSA by PCR NEGATIVE NEGATIVE Final    Comment:        The GeneXpert MRSA Assay (FDA approved for NASAL specimens only), is one component of a comprehensive MRSA colonization surveillance program. It is not intended to diagnose MRSA infection nor to guide or monitor treatment for MRSA infections.     BRIEF ADMITTING H & P: This is an opiate tolerant  patient with Jb SS and on chronic red cell pheresis who presents with c/o pain in the b/l  Lateral ribs which is throbbing and occasionally sharp in nature which his typical of pain of sickle cell crisis. He denies any fevers, chills, nausea, vomiting or diarrhea. He does have a h/o pulmonary hypertension and  normally uses Oxygen at night but does not require Oxygen during the waking hours. His baseline Hb is 8.0 while using Hydrea. He has chronic pain syndrome and is on MS contin 30 mg every 12 hours.  In the ED he was found to be hypoxic and a CXR was at baseline. He received 3 doses of Dilaudid, 1 dose of Toradol and IVF. His pain was still >7/10 and I am asked to admit him for sickle cell crisis.    Hospital Course:  Present on Admission: . Sickle cell crisis ([place])  . Hypoxemia: Pt was non-compliant with his Xarelto and Ambrisentan for the last 3 weeks prior top admission. He may very well have a PE however he is  hemodynamically stable and the test will not change the course of therapy thus a CT angiogram was not performed. Pt was continued on Xarelto. Additionally he was without Ambrisentan and was not taking his weight or his Torsemide. He was resumed on Ambrisentan and received 3 doses of Torsemide. At the time of discharge he is requiring 2 l/min of oxygen and is saturating 93-97% on 2L. He is instructed to check his weights daily and report a 2# weight gain to his PMD. He is also advised to use his Oxygen nATC,. However please note that on leaving he refused to wear the oxygen despite explaining the risk of up to and including death. I have a grave concern about his adherence to medications since separating from his girlfriend who is a Marine scientist as was instrumental in helping him to maintain adherence.   . Anemia of chronic disease:  Hb decreased to a nadir of 5.1 g/dL. Pt was transfused 1 unit of blood and at the time of discharge, Hb was 7.5 g/dL. He is to follow up with PMD to have labs checkied  in one week.   . Chronic Anticoagulation: He was without his Xarelto for about 3 weeks. I suspect that he may have a a PE but further testing will not change the course of treatment. Pt receives serial red cell pheresis at [facility] and has an appointment on 11/10/2015 for pheresis.  . Pulmonary Hypertension: Continue ambrisentan. His weight is increased so advised patient to check weights daily and continue Torsemide as prescribed.   . Chronic Pain: Continue MS Contin.    Disposition and Follow-up:  Pt discharged in good condition. He is to wear Oxygen 2 L/min ATC.    DISCHARGE EXAM:  General: Alert, awake, oriented x3, in no apparent distress.  HEENT: [place]/AT PEERL, EOMI, anicteric Neck: Trachea midline, no masses, no thyromegal,y no JVD, no carotid bruit OROPHARYNX: Moist, No exudate/ erythema/lesions.  Heart: Regular rate and rhythm, without murmurs, rubs, gallops or S3. PMI non-displaced. Exam reveals no decreased pulses. Pulmonary/Chest: Normal effort. Breath sounds normal. No. Apnea. Clear to auscultation,no stridor,  no wheezing and no rhonchi noted. No respiratory distress and no tenderness noted. Abdomen: Soft, nontender, nondistended, normal bowel sounds, no masses no hepatosplenomegaly noted. No fluid wave and no ascites. There is no guarding or rebound. Neuro: Alert and oriented to person, place and time. Normal motor skills, Displays no atrophy or tremors and exhibits normal muscle tone.  No focal neurological deficits noted cranial nerves II through XII grossly intact. No sensory deficit noted.  Strength at baseline in bilateral upper and lower extremities. Gait normal. Musculoskeletal: No warm swelling or erythema around joints, no spinal tenderness noted. Psychiatric: Patient alert and oriented x3, good insight and cognition. Mood, memory, affect and judgement normal Lymph node survey: No cervical axillary or inguinal lymphadenopathy noted. Skin: Skin is warm and dry. No  bruising, no ecchymosis and no rash noted. Pt is not diaphoretic. No erythema. No pallor    Blood pressure 115/79, pulse 78, temperature 98.5 F (36.9 C), temperature source Oral, resp. rate 13, height 6' (1.829 m), weight 80.3 kg (177 lb 0.5 oz), SpO2 99 % on 2 l/min.   No results for input(s): NA, K, CL, CO2, GLUCOSE, BUN, CREATININE, CALCIUM, MG, PHOS in the last 72 hours. No results for input(s): AST, ALT, ALKPHOS, BILITOT, PROT, ALBUMIN in the last 72 hours. No results for input(s): LIPASE, AMYLASE in the last 72 hours.  Recent Labs  11/01/15 1347 11/02/15 1310  WBC 9.5 11.8*  NEUTROABS  --  7.1  HGB 5.1* 7.5*  HCT 15.0* 22.1*  MCV 90.4 89.8  PLT 352 473*     Total time spent including face to face and decision making was greater than 30 minutes  Signed: [name],[name] 11/03/2015, 10:15 AM

## 2015-11-03 NOTE — Progress Notes (Signed)
Nursing Discharge Summary  Patient ID: KYAH ROSSER MRN: [id number] DOB/AGE: 1980-12-02 35 y.o.  Admit date: 10/27/2015 Discharge date: 11/03/2015  Discharged Condition: good  Disposition: 01-Home or Self Care  Follow-up Information    [name], MD Follow up on 11/10/2015.   Specialty:  Internal Medicine Why:  needs to change appointment as patient has a confilcting appointment at [facility] information: [place] [id number] [phone]           Prescriptions Given: No new prescriptions.  Patient follow up appointments and medications discussed.  Patient oxygen delivered to bedside.    Means of Discharge: Patient to ambulate downstairs to be discharged to home via private vehicle.  Patient didn't wear oxygen home.   Signed: Buel Ream 11/03/2015, 2:02 PM

## 2015-11-03 NOTE — Progress Notes (Signed)
Pt with home 02 need. Desaturation screen done by nursing and DME 02 order received. [facility] [employer] rep contacted for home 02. Marney Doctor RN,BSN,NCM [phone]

## 2015-11-03 NOTE — Progress Notes (Signed)
SATURATION QUALIFICATIONS: (This note is used to comply with regulatory documentation for home oxygen)  Patient Saturations on Room Air at Rest = 92%  Patient Saturations on Room Air while Ambulating = 72%  Patient Saturations on 2 Liters of oxygen while Ambulating = 93%  Please briefly explain why patient needs home oxygen:  Patient desats without oxygen on ambulation.

## 2015-11-05 ENCOUNTER — Telehealth (HOSPITAL_COMMUNITY): Payer: Self-pay | Admitting: *Deleted

## 2015-11-05 LAB — TYPE AND SCREEN
ABO/RH(D): O POS
ANTIBODY SCREEN: NEGATIVE
UNIT DIVISION: 0
Unit division: 0

## 2015-11-05 NOTE — Telephone Encounter (Signed)
Pt called requesting to come to the [facility] for treatment. Pt advised to go to the emergency department for treatment, because the beds are filled in the [facility]. Pt voiced understanding.

## 2015-11-06 ENCOUNTER — Other Ambulatory Visit: Payer: Self-pay | Admitting: Internal Medicine

## 2015-11-06 ENCOUNTER — Telehealth: Payer: Self-pay

## 2015-11-06 ENCOUNTER — Emergency Department (HOSPITAL_COMMUNITY)
Admission: EM | Admit: 2015-11-06 | Discharge: 2015-11-06 | Payer: Medicare Other | Attending: Emergency Medicine | Admitting: Emergency Medicine

## 2015-11-06 ENCOUNTER — Encounter (HOSPITAL_COMMUNITY): Payer: Self-pay

## 2015-11-06 ENCOUNTER — Encounter (HOSPITAL_COMMUNITY): Payer: Self-pay | Admitting: *Deleted

## 2015-11-06 ENCOUNTER — Non-Acute Institutional Stay (EMERGENCY_DEPARTMENT_HOSPITAL)
Admission: AD | Admit: 2015-11-06 | Discharge: 2015-11-06 | Disposition: A | Discharge status: Home / Self Care | Payer: Medicare Other | Source: Ambulatory Visit | Attending: Internal Medicine | Admitting: Internal Medicine

## 2015-11-06 DIAGNOSIS — F5101 Primary insomnia: Secondary | ICD-10-CM

## 2015-11-06 DIAGNOSIS — Z9981 Dependence on supplemental oxygen: Secondary | ICD-10-CM | POA: Insufficient documentation

## 2015-11-06 DIAGNOSIS — Z79899 Other long term (current) drug therapy: Secondary | ICD-10-CM | POA: Insufficient documentation

## 2015-11-06 DIAGNOSIS — Z96641 Presence of right artificial hip joint: Secondary | ICD-10-CM | POA: Insufficient documentation

## 2015-11-06 DIAGNOSIS — D5700 Hb-SS disease with crisis, unspecified: Secondary | ICD-10-CM | POA: Diagnosis present

## 2015-11-06 DIAGNOSIS — Z79891 Long term (current) use of opiate analgesic: Secondary | ICD-10-CM | POA: Insufficient documentation

## 2015-11-06 DIAGNOSIS — R0781 Pleurodynia: Secondary | ICD-10-CM | POA: Diagnosis present

## 2015-11-06 DIAGNOSIS — Z7982 Long term (current) use of aspirin: Secondary | ICD-10-CM

## 2015-11-06 DIAGNOSIS — Z7901 Long term (current) use of anticoagulants: Secondary | ICD-10-CM

## 2015-11-06 DIAGNOSIS — Z86711 Personal history of pulmonary embolism: Secondary | ICD-10-CM | POA: Insufficient documentation

## 2015-11-06 DIAGNOSIS — I10 Essential (primary) hypertension: Secondary | ICD-10-CM | POA: Insufficient documentation

## 2015-11-06 DIAGNOSIS — J9610 Chronic respiratory failure, unspecified whether with hypoxia or hypercapnia: Secondary | ICD-10-CM

## 2015-11-06 DIAGNOSIS — Z86718 Personal history of other venous thrombosis and embolism: Secondary | ICD-10-CM | POA: Insufficient documentation

## 2015-11-06 DIAGNOSIS — Z87891 Personal history of nicotine dependence: Secondary | ICD-10-CM

## 2015-11-06 DIAGNOSIS — I2781 Cor pulmonale (chronic): Secondary | ICD-10-CM

## 2015-11-06 LAB — CBC WITH DIFFERENTIAL/PLATELET
Basophils Absolute: 0.1 10*3/uL (ref 0.0–0.1)
Basophils Relative: 1 %
EOS ABS: 0.3 10*3/uL (ref 0.0–0.7)
EOS PCT: 2 %
HCT: 24.5 % — ABNORMAL LOW (ref 39.0–52.0)
HEMOGLOBIN: 8.3 g/dL — AB (ref 13.0–17.0)
LYMPHS ABS: 2.1 10*3/uL (ref 0.7–4.0)
LYMPHS PCT: 14 %
MCH: 30.4 pg (ref 26.0–34.0)
MCHC: 33.9 g/dL (ref 30.0–36.0)
MCV: 89.7 fL (ref 78.0–100.0)
MONOS PCT: 13 %
Monocytes Absolute: 1.9 10*3/uL — ABNORMAL HIGH (ref 0.1–1.0)
Neutro Abs: 10.3 10*3/uL — ABNORMAL HIGH (ref 1.7–7.7)
Neutrophils Relative %: 70 %
PLATELETS: 430 10*3/uL — AB (ref 150–400)
RBC: 2.73 MIL/uL — ABNORMAL LOW (ref 4.22–5.81)
RDW: 19.8 % — ABNORMAL HIGH (ref 11.5–15.5)
WBC: 14.7 10*3/uL — ABNORMAL HIGH (ref 4.0–10.5)

## 2015-11-06 LAB — RETICULOCYTES
RBC.: 2.73 MIL/uL — ABNORMAL LOW (ref 4.22–5.81)
RETIC CT PCT: 5.9 % — AB (ref 0.4–3.1)
Retic Count, Absolute: 161.1 10*3/uL (ref 19.0–186.0)

## 2015-11-06 LAB — BASIC METABOLIC PANEL
Anion gap: 6 (ref 5–15)
BUN: 10 mg/dL (ref 6–20)
CHLORIDE: 106 mmol/L (ref 101–111)
CO2: 26 mmol/L (ref 22–32)
CREATININE: 0.68 mg/dL (ref 0.61–1.24)
Calcium: 9 mg/dL (ref 8.9–10.3)
GFR calc non Af Amer: >60 mL/min (ref >60)
Glucose, Bld: 103 mg/dL — ABNORMAL HIGH (ref 65–99)
POTASSIUM: 3.5 mmol/L (ref 3.5–5.1)
SODIUM: 138 mmol/L (ref 135–145)

## 2015-11-06 MED ORDER — SODIUM CHLORIDE 0.45 % IV SOLN
INTRAVENOUS | Status: DC
  Administered 2015-11-06: 04:00:00 via INTRAVENOUS

## 2015-11-06 MED ORDER — SODIUM CHLORIDE 0.9% FLUSH: 9.0000 mL | INTRAVENOUS | Status: DC | PRN

## 2015-11-06 MED ORDER — KETOROLAC TROMETHAMINE 30 MG/ML IJ SOLN
30.0000 mg | Freq: Once | INTRAMUSCULAR | Status: AC
  Administered 2015-11-06: 15 mg via INTRAVENOUS
  Filled 2015-11-06: qty 1

## 2015-11-06 MED ORDER — HEPARIN SOD (PORK) LOCK FLUSH 100 UNIT/ML IV SOLN
500.0000 [IU] | INTRAVENOUS | Status: AC | PRN
  Administered 2015-11-06: 500 [IU]

## 2015-11-06 MED ORDER — ONDANSETRON HCL 4 MG/2ML IJ SOLN: 4.0000 mg | Freq: Four times a day (QID) | INTRAMUSCULAR | Status: DC | PRN

## 2015-11-06 MED ORDER — SODIUM CHLORIDE 0.45 % IV SOLN
INTRAVENOUS | Status: DC
  Administered 2015-11-06: 11:00:00 via INTRAVENOUS

## 2015-11-06 MED ORDER — MORPHINE SULFATE ER 30 MG PO TBCR: 30.0000 mg | EXTENDED_RELEASE_TABLET | Freq: Two times a day (BID) | ORAL | 0 refills | Status: DC

## 2015-11-06 MED ORDER — HYDROMORPHONE HCL 4 MG PO TABS: 4.0000 mg | ORAL_TABLET | ORAL | 0 refills | Status: DC | PRN

## 2015-11-06 MED ORDER — SODIUM CHLORIDE 0.9% FLUSH
10.0000 mL | INTRAVENOUS | Status: AC | PRN
  Administered 2015-11-06: 10 mL

## 2015-11-06 MED ORDER — DIPHENHYDRAMINE HCL 25 MG PO CAPS
25.0000 mg | ORAL_CAPSULE | ORAL | Status: DC | PRN
  Administered 2015-11-06: 25 mg via ORAL
  Filled 2015-11-06: qty 1

## 2015-11-06 MED ORDER — DIPHENHYDRAMINE HCL 50 MG/ML IJ SOLN
25.0000 mg | INTRAMUSCULAR | Status: DC | PRN
  Filled 2015-11-06: qty 0.5

## 2015-11-06 MED ORDER — HYDROMORPHONE HCL 2 MG/ML IJ SOLN
2.0000 mg | INTRAMUSCULAR | Status: DC | PRN
  Administered 2015-11-06 (×3): 2 mg via INTRAVENOUS
  Filled 2015-11-06 (×3): qty 1

## 2015-11-06 MED ORDER — SENNOSIDES-DOCUSATE SODIUM 8.6-50 MG PO TABS: 1.0000 | ORAL_TABLET | Freq: Two times a day (BID) | ORAL | Status: DC

## 2015-11-06 MED ORDER — POLYETHYLENE GLYCOL 3350 17 G PO PACK: 17.0000 g | PACK | Freq: Every day | ORAL | Status: DC | PRN

## 2015-11-06 MED ORDER — HYDROMORPHONE 1 MG/ML IV SOLN
INTRAVENOUS | Status: DC
  Administered 2015-11-06: 18.2 mg via INTRAVENOUS
  Administered 2015-11-06: 11:00:00 via INTRAVENOUS
  Filled 2015-11-06: qty 25

## 2015-11-06 MED ORDER — ZOLPIDEM TARTRATE 10 MG PO TABS: 10.0000 mg | ORAL_TABLET | Freq: Every evening | ORAL | 0 refills | Status: DC | PRN

## 2015-11-06 MED ORDER — NALOXONE HCL 0.4 MG/ML IJ SOLN: 0.4000 mg | INTRAMUSCULAR | Status: DC | PRN

## 2015-11-06 MED ORDER — KETOROLAC TROMETHAMINE 15 MG/ML IJ SOLN
15.0000 mg | Freq: Once | INTRAMUSCULAR | Status: AC
  Administered 2015-11-06: 15 mg via INTRAVENOUS
  Filled 2015-11-06: qty 1

## 2015-11-06 MED ORDER — DIPHENHYDRAMINE HCL 25 MG PO CAPS
25.0000 mg | ORAL_CAPSULE | Freq: Once | ORAL | Status: AC
  Administered 2015-11-06: 25 mg via ORAL
  Filled 2015-11-06: qty 1

## 2015-11-06 NOTE — Progress Notes (Signed)
Admitted to [facility] from the ED for treatment of Sickle Cell crisis pain. 8/10 pain which is generalized. Treated with IV fluids, IV Toradol, and PCA Dilaudid. Pain level down to 4/10 at time of discharge. Patient requesting discharge to home. Went over discharge instructions and copy given to patient. Alert, oriented and ambulatory at time of discharge. Oximetry reading 94% on RA at time of discharge. Patient uses home oxygen and encouraged to use when he gets home and he took his IS and agrees to use this weekend.

## 2015-11-06 NOTE — Discharge Summary (Signed)
Physician Discharge Summary  [name] [id number] DOB: 1980/05/23 DOA: 11/06/2015  PCP: [name], MD  Admit date: 11/06/2015  Discharge date: 11/06/2015  Time spent: 30 minutes  Discharge Diagnoses:  Active Problems:   Sickle cell anemia with pain [facility])   Discharge Condition: Stable  Diet recommendation: Regular  History of present illness:  [name] is a 35 y.o. male with history of sickle cell disease, acute chest syndrome, DVT, PE on Xarelto, pulmonary hypertension, cor pulmonale, chronic respiratory failure on home oxygen. He was transitioned from the ED to the day hospital today with complaint of pain in bilateral ribs and lower back, was managed in the ED but after rounds of pain meds, still in significant pain, so transferred to the day hospital for extended observation and continued pain management.. Rating hispain intensity at 9/10.Patient denies chest pain, shortness of breath, difficulty breathing.Patient denies N/V/D and abdominal pain.   Hospital Course:  [name] was admitted to the day hospital with sickle cell painful crisis. Patient was treated with weight based IV Dilaudid PCA, IV Toradol as well as IV fluids. Jolen showed significant improvement symptomatically, pain improved from 9 to 5/10 at the time of discharge. Patient was discharged home is a hemodynamically stable condition. Keithan will follow-up at the clinic as previously scheduled, continue with home medications as per prior to admission.  Discharge Instructions We discussed the need for good hydration, monitoring of hydration status, avoidance of heat, cold, stress, and infection triggers. We discussed the need to be compliant with taking Hydrea. Shaunt was reminded of the need to seek medical attention of any symptoms of bleeding, anemia, or infection occurs.  Discharge Exam: Vitals:   11/06/15 1400 11/06/15 1500  BP: 116/71 114/72  Pulse: 77 83  Resp: 14 18   Temp:  98.3 F (36.8 C)    General appearance: alert, cooperative and no distress Eyes: conjunctivae/corneas clear. PERRL, EOM's intact. Fundi benign. Neck: no adenopathy, no carotid bruit, no JVD, supple, symmetrical, trachea midline and thyroid not enlarged, symmetric, no tenderness/mass/nodules Back: symmetric, no curvature. ROM normal. No CVA tenderness. Resp: clear to auscultation bilaterally Chest wall: no tenderness Cardio: regular rate and rhythm, S1, S2 normal, no murmur, click, rub or gallop GI: soft, non-tender; bowel sounds normal; no masses, no organomegaly Extremities: extremities normal, atraumatic, no cyanosis or edema Pulses: 2+ and symmetric Skin: Skin color, texture, turgor normal. No rashes or lesions Neurologic: Grossly normal   Current Discharge Medication List    CONTINUE these medications which have NOT CHANGED   Details  ambrisentan (LETAIRIS) 5 MG tablet Take 5 mg by mouth at bedtime.    aspirin 81 MG chewable tablet Chew 1 tablet (81 mg total) by mouth daily. Qty: 30 tablet, Refills: 11    Cholecalciferol (VITAMIN D) 2000 units tablet Take 1 tablet (2,000 Units total) by mouth daily. Qty: 90 tablet, Refills: 3   Associated Diagnoses: Vitamin D deficiency    Deferasirox (JADENU) 360 MG TABS Take 1,080 mg by mouth daily.     folic acid (FOLVITE) 1 MG tablet Take 1 tablet (1 mg total) by mouth every morning. Qty: 30 tablet, Refills: 11   Associated Diagnoses: Hb-SS disease without crisis ([place])    gabapentin (NEURONTIN) 300 MG capsule Take 1 capsule (300 mg total) by mouth 3 (three) times daily. Qty: 270 capsule, Refills: 3    HYDROmorphone (DILAUDID) 4 MG tablet Take 1 tablet (4 mg total) by mouth every 4 (four) hours as needed for  severe pain. Qty: 90 tablet, Refills: 0    lisinopril (PRINIVIL,ZESTRIL) 5 MG tablet Take 1 tablet (5 mg total) by mouth daily. Qty: 30 tablet, Refills: 1    morphine (MS CONTIN) 30 MG 12 hr tablet Take 1 tablet (30  mg total) by mouth every 12 (twelve) hours. Qty: 60 tablet, Refills: 0    rivaroxaban (XARELTO) 20 MG TABS tablet Take 20 mg by mouth daily.     torsemide (DEMADEX) 20 MG tablet Take 20 mg by mouth daily as needed. For increase in patient weight greater than 3 lbs.    zolpidem (AMBIEN) 10 MG tablet Take 1 tablet (10 mg total) by mouth at bedtime as needed for sleep. Qty: 30 tablet, Refills: 0   Associated Diagnoses: Primary insomnia       No Known Allergies   Significant Diagnostic Studies: Dg Chest 2 View  Result Date: 10/28/2015 CLINICAL DATA:  Sickle cell crisis, bilateral flank pain, former smoker, history of hypertension. EXAM: CHEST  2 VIEW COMPARISON:  PA and lateral chest x-ray of October 27, 2015 FINDINGS: The lungs are adequately inflated. The interstitial markings remain increased. The pulmonary vascularity remains engorged and the cardiac silhouette remains enlarged. There is no alveolar pneumonia nor pleural effusion or pneumothorax. The power port catheter tip projects over the distal third of the SVC. There is multilevel degenerative disc disease of the thoracic spine. IMPRESSION: Stable cardiomegaly with mild pulmonary vascular prominence. No pneumonia nor other acute cardiopulmonary abnormality. Electronically Signed   By: David  Martinique M.D.   On: 10/28/2015 09:32   Dg Chest 2 View  Result Date: 10/27/2015 CLINICAL DATA:  Sickle cell pain. New mid to left lateral chest pain this morning. EXAM: CHEST  2 VIEW COMPARISON:  10/23/2015 FINDINGS: The cardiac silhouette is enlarged but stable in appearance. Left-sided port catheter tip terminates in the proximal right atrium, unchanged. No pneumonic consolidation nor overt pulmonary edema. Minimal subpleural scarring in the right upper lobe. No effusion or pneumothorax. No suspicious osseous abnormalities. 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 Right upper quadrant surgical clips consistent with  cholecystectomy IMPRESSION: Stable cardiomegaly without acute pulmonary disease. Electronically Signed   By: Ashley Royalty M.D.   On: 10/27/2015 08:10   Dg Chest 2 View  Result Date: 10/23/2015 CLINICAL DATA:  Tachypnea and hypoxia EXAM: CHEST  2 VIEW COMPARISON:  09/26/2015 FINDINGS: Chronic cardiomegaly. Porta catheter on the left with tip at the upper right atrium. Chronic interstitial coarsening with small scattered scar-like opacities. There is no edema, consolidation, effusion, or pneumothorax. Bilateral humeral head avascular necrosis. IMPRESSION: Stable from prior.  No evidence of acute disease. Electronically Signed   By: Monte Fantasia M.D.   On: 10/23/2015 14:27    Signed:  [name] MD, [place], [place], [place], CPE   11/06/2015, 4:28 PM

## 2015-11-06 NOTE — ED Notes (Signed)
Dr. Lenoria Chime at bedside to speak with pt about plan of care. Pt to receive additional pain medication and the to be evaluated by Sickle cell clinic.

## 2015-11-06 NOTE — ED Provider Notes (Signed)
Pt transferred to [facility].  Discussed with Dr. Doreene Burke.   Tanna Furry, MD 11/06/15 1505

## 2015-11-06 NOTE — ED Triage Notes (Signed)
Breakfast given.  

## 2015-11-06 NOTE — ED Triage Notes (Signed)
Pt complains of bilateral side pain and back pain for the last two hours

## 2015-11-06 NOTE — ED Triage Notes (Signed)
Pt falling asleep during assessment. Pt states pain is still 8/10 as he falls back to sleep.

## 2015-11-06 NOTE — ED Triage Notes (Signed)
Spoke with Shirlean Mylar, RN at [facility], she will come to ED and take pt to clinic, pt updated

## 2015-11-06 NOTE — ED Provider Notes (Addendum)
[facility] DEPT Provider Note   CSN: [id number] Arrival date & time: 11/06/15  [id number]     History   Chief Complaint Chief Complaint  Patient presents with  . Sickle Cell Pain Crisis    HPI [name] is a 35 y.o. male.  HPI Pt with hx of sickle cell anemia and related complications comes in with cc of pain. Pt is c/o rib pain and back pain that is typical of his sickle cell. The pain is sharp and throbbing, and it started today. Pt has taken home dilaudid and oxy w/o significant relief. Pain is 8/10. Pt has no cough, fevers, dib. Reports pain location and character is similar to sickle cell pain and suspected weather change as the cause.   Past Medical History:  Diagnosis Date  . Acute chest syndrome ([place]) 06/18/2013  . Acute embolism and thrombosis of right internal jugular vein ([place])   . Alcohol consumption of one to four drinks per day   . Avascular necrosis (HCC)    Right Hip  . Blood transfusion   . Chronic anticoagulation   . Demand ischemia ([place]) 01/02/2014  . Former smoker   . Functional asplenia   . Hb-SS disease with crisis ([place])   . History of Clostridium difficile infection   . History of pulmonary embolus (PE)   . Hypertension   . Hypokalemia   . Leukocytosis    Chronic  . Mood disorder ([place])   . Noncompliance with medication regimen   . Oxygen deficiency   . Pulmonary hypertension   . Second hand tobacco smoke exposure   . Sickle cell anemia (HCC)   . Sickle-cell crisis with associated acute chest syndrome ([place]) 05/13/2013  . Thrombocytosis (HCC)    Chronic  . Uses marijuana     Patient Active Problem List   Diagnosis Date Noted  . Sickle cell crisis ([place]) 10/27/2015  . Sickle cell pain crisis ([place]) 09/18/2015  . Sickle cell anemia with crisis ([place]) 09/04/2015  . Shortness of breath   . Pulmonary embolism without acute cor pulmonale ([place])   . Fever   . Sickle cell anemia with pain ([place]) 05/07/2015  . Hypoxia   . Anemia 01/31/2015  .  Symptomatic anemia   . Hb-SS disease without crisis ([place]) 10/07/2014  . PVC's (premature ventricular contractions) 09/10/2014  . Abnormal EKG 09/10/2014  . Chronic atrial fibrillation ([place]) 09/09/2014  . Cor pulmonale, chronic ([place]) 08/25/2014  . Sickle cell anemia ([place]) 06/25/2014  . Anemia of chronic disease 06/25/2014  . PAH (pulmonary artery hypertension) 03/18/2014  . Hb-SS disease with crisis ([place]) 01/22/2014  . Paralytic strabismus, external ophthalmoplegia   . Chronic pain syndrome 12/12/2013  . Chronic anticoagulation 08/22/2013  . Essential hypertension 08/22/2013  . Pulmonary HTN 06/18/2013  . Functional asplenia   . Vitamin D deficiency 02/13/2013  . Hx of pulmonary embolus 06/29/2012  . Hemochromatosis 12/14/2011  . Avascular necrosis [facility])     Past Surgical History:  Procedure Laterality Date  . CHOLECYSTECTOMY     01/2008  . Excision of left periauricular cyst     10/2009  . Excision of right ear lobe cyst with primary closur     11/2007  . Porta cath placement    . Porta cath removal    . PORTACATH PLACEMENT  01/05/2012   Procedure: INSERTION PORT-A-CATH;  Surgeon: Odis Hollingshead, MD;  Location: [facility];  Service: General;  Laterality: N/A;  ultrasound guiced port a cath insertion with fluoroscopy  .  Right hip replacement     08/2006  . UMBILICAL HERNIA REPAIR     01/2008       Home Medications    Prior to Admission medications   Medication Sig Start Date End Date Taking? Authorizing Provider  ambrisentan (LETAIRIS) 5 MG tablet Take 5 mg by mouth at bedtime.   Yes Historical Provider, MD  aspirin 81 MG chewable tablet Chew 1 tablet (81 mg total) by mouth daily. 07/23/14  Yes [name], MD  Cholecalciferol (VITAMIN D) 2000 units tablet Take 1 tablet (2,000 Units total) by mouth daily. 02/23/15  Yes Dorena Dew, FNP  Deferasirox (JADENU) 360 MG TABS Take 1,080 mg by mouth daily.    Yes Historical Provider, MD  folic acid (FOLVITE) 1 MG  tablet Take 1 tablet (1 mg total) by mouth every morning. 02/23/15  Yes Dorena Dew, FNP  gabapentin (NEURONTIN) 300 MG capsule Take 1 capsule (300 mg total) by mouth 3 (three) times daily. 06/01/15  Yes Tresa Garter, MD  HYDROmorphone (DILAUDID) 4 MG tablet Take 1 tablet (4 mg total) by mouth every 4 (four) hours as needed for severe pain. 10/16/15  Yes Tresa Garter, MD  lisinopril (PRINIVIL,ZESTRIL) 5 MG tablet Take 1 tablet (5 mg total) by mouth daily. 08/04/15  Yes [name], MD  morphine (MS CONTIN) 30 MG 12 hr tablet Take 30 mg by mouth every 12 (twelve) hours.   Yes Historical Provider, MD  rivaroxaban (XARELTO) 20 MG TABS tablet Take 20 mg by mouth daily.    Yes Historical Provider, MD  torsemide (DEMADEX) 20 MG tablet Take 20 mg by mouth daily as needed. For increase in patient weight greater than 3 lbs. 07/27/15 07/26/16 Yes Historical Provider, MD  zolpidem (AMBIEN) 10 MG tablet Take 1 tablet (10 mg total) by mouth at bedtime as needed for sleep. 10/09/15  Yes Micheline Chapman, NP    Family History Family History  Problem Relation Age of Onset  . Sickle cell trait Mother   . Depression Mother   . Diabetes Mother   . Sickle cell trait Father   . Sickle cell trait Brother     Social History Social History  Substance Use Topics  . Smoking status: Former Smoker    Packs/day: 0.50    Years: 10.00    Types: Cigarettes    Quit date: 05/29/2011  . Smokeless tobacco: Never Used  . Alcohol use No     Allergies   Review of patient's allergies indicates no known allergies.   Review of Systems Review of Systems  ROS 10 Systems reviewed and are negative for acute change except as noted in the HPI.     Physical Exam Updated Vital Signs BP 110/80 (BP Location: Left Arm)   Pulse 60   Temp 98.9 F (37.2 C) (Oral)   Resp 18   Ht 6' (1.829 m)   Wt 175 lb (79.4 kg)   SpO2 96%   BMI 23.73 kg/m   Physical Exam  Constitutional: He is oriented to  person, place, and time. He appears well-developed.  HENT:  Head: Normocephalic and atraumatic.  Eyes: Conjunctivae and EOM are normal. Pupils are equal, round, and reactive to light. Scleral icterus is present.  Neck: Normal range of motion. Neck supple.  Cardiovascular: Normal rate and regular rhythm.   Pulmonary/Chest: Effort normal and breath sounds normal. He has no wheezes.  Abdominal: Soft. Bowel sounds are normal. He exhibits no distension. There is  no tenderness. There is no rebound and no guarding.  Neurological: He is alert and oriented to person, place, and time.  Skin: Skin is warm.  Nursing note and vitals reviewed.    ED Treatments / Results  Labs (all labs ordered are listed, but only abnormal results are displayed) Labs Reviewed  CBC WITH DIFFERENTIAL/PLATELET - Abnormal; Notable for the following:       Result Value   WBC 14.7 (*)    RBC 2.73 (*)    Hemoglobin 8.3 (*)    HCT 24.5 (*)    RDW 19.8 (*)    Platelets 430 (*)    Neutro Abs 10.3 (*)    Monocytes Absolute 1.9 (*)    All other components within normal limits  RETICULOCYTES - Abnormal; Notable for the following:    Retic Ct Pct 5.9 (*)    RBC. 2.73 (*)    All other components within normal limits  BASIC METABOLIC PANEL - Abnormal; Notable for the following:    Glucose, Bld 103 (*)    All other components within normal limits    EKG  EKG Interpretation None       Radiology No results found.  Procedures Procedures (including critical care time)  Medications Ordered in ED Medications  0.45 % sodium chloride infusion ( Intravenous New Bag/Given 11/06/15 0419)  HYDROmorphone (DILAUDID) injection 2 mg (2 mg Intravenous Given 11/06/15 0628)  diphenhydrAMINE (BENADRYL) capsule 25 mg (25 mg Oral Given 11/06/15 0424)  ketorolac (TORADOL) 15 MG/ML injection 15 mg (15 mg Intravenous Given 11/06/15 0725)     Initial Impression / Assessment and Plan / ED Course  I have reviewed the triage vital  signs and the nursing notes.  Pertinent labs & imaging results that were available during my care of the patient were reviewed by me and considered in my medical decision making (see chart for details).  Clinical Course  Comment By Time  Pt reports that pain is at 6/10. He would prefer to go to the sickle cell clinic to see if his pain can improve even further. 3 rounds of dilaudid given. Toradol ordered. Varney Biles, MD 10/06 [phone]   Pt comes in with sickle cell related pain.  VSS and WNL -  hemodynamically stable  Pain appears vaso-occlusive tpye and typical of previous pain. Will start mild hydration with 1/2 NS while patient is getting his workup. Appropriate labs ordered. Pain control with weight based dilaudid. We will reassess patient after 3 doses. Goal is to break the pain and see if patient feels comfortable going home.  Currently, there is no signs of severe decompensation clinically. Will continue to monitor closely. If we are unable to control the pain, we will admit the patient.    Final Clinical Impressions(s) / ED Diagnoses   Final diagnoses:  Sickle cell pain crisis [facility])    New Prescriptions New Prescriptions   No medications on file     Varney Biles, MD 11/06/15 0715    Varney Biles, MD 11/06/15 [phone]

## 2015-11-06 NOTE — ED Triage Notes (Signed)
Sickle Cell clinic paged twice, no response as of yet

## 2015-11-06 NOTE — Discharge Instructions (Signed)

## 2015-11-06 NOTE — H&P (Signed)
[facility] History and Physical  [name] [id number] DOB: [birth year] DOA: 11/06/2015  PCP: [name], MD   Chief Complaint:  Chief Complaint  Patient presents with  . Sickle Cell Pain Crisis    HPI: [name] is a 35 y.o. male with history of sickle cell disease, acute chest syndrome, DVT, PE on Xarelto, pulmonary hypertension, cor pulmonale, chronic respiratory failure on home oxygen. He was transitioned from the ED to the day hospital today with complaint of pain in bilateral ribs and lower back, was managed in the ED but after rounds of pain meds, still in significant pain, so transferred to the day hospital for extended observation and continued pain management.. Rating his pain intensity at 9/10.Patient denies chest pain, shortness of breath, difficulty breathing.Patient denies N/V/D and abdominal pain.   Systemic Review: General: The patient denies anorexia, fever, weight loss Cardiac: Denies chest pain, syncope, palpitations, pedal edema  Respiratory: Denies cough, shortness of breath, wheezing GI: Denies severe indigestion/heartburn, abdominal pain, nausea, vomiting, diarrhea and constipation GU: Denies hematuria, incontinence, dysuria  Musculoskeletal: Denies arthritis  Skin: Denies suspicious skin lesions Neurologic: Denies focal weakness or numbness, change in vision  Past Medical History:  Diagnosis Date  . Acute chest syndrome ([place]) 06/18/2013  . Acute embolism and thrombosis of right internal jugular vein ([place])   . Alcohol consumption of one to four drinks per day   . Avascular necrosis (HCC)    Right Hip  . Blood transfusion   . Chronic anticoagulation   . Demand ischemia ([place]) 01/02/2014  . Former smoker   . Functional asplenia   . Hb-SS disease with crisis ([place])   . History of Clostridium difficile infection   . History of pulmonary embolus (PE)   . Hypertension   . Hypokalemia   . Leukocytosis    Chronic  . Mood  disorder ([place])   . Noncompliance with medication regimen   . Oxygen deficiency   . Pulmonary hypertension   . Second hand tobacco smoke exposure   . Sickle cell anemia (HCC)   . Sickle-cell crisis with associated acute chest syndrome ([place]) 05/13/2013  . Thrombocytosis (HCC)    Chronic  . Uses marijuana     Past Surgical History:  Procedure Laterality Date  . CHOLECYSTECTOMY     01/2008  . Excision of left periauricular cyst     10/2009  . Excision of right ear lobe cyst with primary closur     11/2007  . Porta cath placement    . Porta cath removal    . PORTACATH PLACEMENT  01/05/2012   Procedure: INSERTION PORT-A-CATH;  Surgeon: Odis Hollingshead, MD;  Location: [facility];  Service: General;  Laterality: N/A;  ultrasound guiced port a cath insertion with fluoroscopy  . Right hip replacement     08/2006  . UMBILICAL HERNIA REPAIR     01/2008    No Known Allergies  Family History  Problem Relation Age of Onset  . Sickle cell trait Mother   . Depression Mother   . Diabetes Mother   . Sickle cell trait Father   . Sickle cell trait Brother       Prior to Admission medications   Medication Sig Start Date End Date Taking? Authorizing Provider  ambrisentan (LETAIRIS) 5 MG tablet Take 5 mg by mouth at bedtime.    Historical Provider, MD  aspirin 81 MG chewable tablet Chew 1 tablet (81 mg total) by mouth daily. 07/23/14  [name], MD  Cholecalciferol (VITAMIN D) 2000 units tablet Take 1 tablet (2,000 Units total) by mouth daily. 02/23/15   Dorena Dew, FNP  Deferasirox (JADENU) 360 MG TABS Take 1,080 mg by mouth daily.     Historical Provider, MD  folic acid (FOLVITE) 1 MG tablet Take 1 tablet (1 mg total) by mouth every morning. 02/23/15   Dorena Dew, FNP  gabapentin (NEURONTIN) 300 MG capsule Take 1 capsule (300 mg total) by mouth 3 (three) times daily. 06/01/15   Tresa Garter, MD  HYDROmorphone (DILAUDID) 4 MG tablet Take 1 tablet (4 mg total) by mouth  every 4 (four) hours as needed for severe pain. 10/16/15   Tresa Garter, MD  lisinopril (PRINIVIL,ZESTRIL) 5 MG tablet Take 1 tablet (5 mg total) by mouth daily. 08/04/15   [name], MD  morphine (MS CONTIN) 30 MG 12 hr tablet Take 30 mg by mouth every 12 (twelve) hours.    Historical Provider, MD  rivaroxaban (XARELTO) 20 MG TABS tablet Take 20 mg by mouth daily.     Historical Provider, MD  torsemide (DEMADEX) 20 MG tablet Take 20 mg by mouth daily as needed. For increase in patient weight greater than 3 lbs. 07/27/15 07/26/16  Historical Provider, MD  zolpidem (AMBIEN) 10 MG tablet Take 1 tablet (10 mg total) by mouth at bedtime as needed for sleep. 11/07/15 12/07/15  Tresa Garter, MD     Physical Exam: Vitals:   11/06/15 1157 11/06/15 1300 11/06/15 1400 11/06/15 1500  BP: 116/74 117/77 116/71 114/72  Pulse: 73 79 77 83  Resp: 15 19 14 18   Temp: 98.2 F (36.8 C)   98.3 F (36.8 C)  TempSrc: Oral   Oral  SpO2: 93% 92% 96% 95%    General: Alert, awake, afebrile, anicteric, not in obvious distress HEENT: Normocephalic and Atraumatic, Mucous membranes pink                PERRLA; EOM intact; No scleral icterus,                 Nares: Patent, Oropharynx: Clear, Fair Dentition                 Neck: FROM, no cervical lymphadenopathy, thyromegaly, carotid bruit or JVD;  CHEST WALL: No tenderness  CHEST: Normal respiration, clear to auscultation bilaterally  HEART: Regular rate and rhythm; no murmurs rubs or gallops  BACK: No kyphosis or scoliosis; no CVA tenderness  ABDOMEN: Positive Bowel Sounds, soft, non-tender; no masses, no organomegaly EXTREMITIES: No cyanosis, clubbing, or edema SKIN:  no rash or ulceration  CNS: Alert and Oriented x 4, Nonfocal exam, CN 2-12 intact  Labs on Admission:  Basic Metabolic Panel:  Recent Labs Lab 11/06/15 0414  NA 138  K 3.5  CL 106  CO2 26  GLUCOSE 103*  BUN 10  CREATININE 0.68  CALCIUM 9.0   Liver Function  Tests: No results for input(s): AST, ALT, ALKPHOS, BILITOT, PROT, ALBUMIN in the last 168 hours. No results for input(s): LIPASE, AMYLASE in the last 168 hours. No results for input(s): AMMONIA in the last 168 hours. CBC:  Recent Labs Lab 10/31/15 0829 11/01/15 1347 11/02/15 1310 11/06/15 0414  WBC 11.9* 9.5 11.8* 14.7*  NEUTROABS  --   --  7.1 10.3*  HGB 6.4* 5.1* 7.5* 8.3*  HCT 19.0* 15.0* 22.1* 24.5*  MCV 90.5 90.4 89.8 89.7  PLT 440* 352 473* 430*   Cardiac Enzymes:  No results for input(s): CKTOTAL, CKMB, CKMBINDEX, TROPONINI in the last 168 hours.  BNP (last 3 results)  Recent Labs  11/30/14 0910 08/02/15 2307  BNP 639.7* 503.2*    ProBNP (last 3 results) No results for input(s): PROBNP in the last 8760 hours.  CBG: No results for input(s): GLUCAP in the last 168 hours.   Assessment/Plan Active Problems:   Sickle cell anemia with pain ([place])   Admits to the Day Hospital  IVF D5 .45% Saline @ 125 mls/hour  Weight based Dilaudid PCA started within 30 minutes of admission  IV Toradol 30 mg Q 6 H  Monitor vitals very closely, Re-evaluate pain scale every hour  2 L of Oxygen by [place]  Patient will be re-evaluated for pain in the context of function and relationship to baseline as care progresses.  If no significant relieve from pain (remains above 5/10) will transfer patient to inpatient services for further evaluation and management  Code Status: Full  Family Communication: None  DVT Prophylaxis: Ambulate as tolerated   Time spent: 12 Minutes  Katrinia Straker, MD, MHA, FACP, FAAP, CPE  If 7PM-7AM, please contact night-coverage [email or web address] 11/06/2015, 4:12 PM

## 2015-11-06 NOTE — ED Notes (Signed)
O2 saturation 91% on room air. Pt placed on 2L O2 via .

## 2015-11-10 ENCOUNTER — Emergency Department (HOSPITAL_COMMUNITY): Payer: Medicare Other

## 2015-11-10 ENCOUNTER — Ambulatory Visit: Payer: Medicare Other | Admitting: Internal Medicine

## 2015-11-10 ENCOUNTER — Encounter (HOSPITAL_COMMUNITY): Payer: Self-pay | Admitting: Emergency Medicine

## 2015-11-10 ENCOUNTER — Emergency Department (HOSPITAL_COMMUNITY)
Admission: EM | Admit: 2015-11-10 | Discharge: 2015-11-10 | Disposition: A | Discharge status: Other Acute Inpatient Hospital | Payer: Medicare Other | Attending: Family Medicine | Admitting: Family Medicine

## 2015-11-10 DIAGNOSIS — R591 Generalized enlarged lymph nodes: Secondary | ICD-10-CM | POA: Diagnosis not present

## 2015-11-10 DIAGNOSIS — Z7901 Long term (current) use of anticoagulants: Secondary | ICD-10-CM | POA: Diagnosis not present

## 2015-11-10 DIAGNOSIS — Z87891 Personal history of nicotine dependence: Secondary | ICD-10-CM | POA: Diagnosis not present

## 2015-11-10 DIAGNOSIS — G894 Chronic pain syndrome: Secondary | ICD-10-CM | POA: Diagnosis present

## 2015-11-10 DIAGNOSIS — I482 Chronic atrial fibrillation, unspecified: Secondary | ICD-10-CM | POA: Diagnosis present

## 2015-11-10 DIAGNOSIS — I2609 Other pulmonary embolism with acute cor pulmonale: Secondary | ICD-10-CM

## 2015-11-10 DIAGNOSIS — I110 Hypertensive heart disease with heart failure: Secondary | ICD-10-CM | POA: Diagnosis not present

## 2015-11-10 DIAGNOSIS — J9610 Chronic respiratory failure, unspecified whether with hypoxia or hypercapnia: Secondary | ICD-10-CM | POA: Diagnosis not present

## 2015-11-10 DIAGNOSIS — I639 Cerebral infarction, unspecified: Secondary | ICD-10-CM | POA: Diagnosis not present

## 2015-11-10 DIAGNOSIS — R0602 Shortness of breath: Secondary | ICD-10-CM | POA: Diagnosis not present

## 2015-11-10 DIAGNOSIS — D5700 Hb-SS disease with crisis, unspecified: Secondary | ICD-10-CM | POA: Diagnosis not present

## 2015-11-10 DIAGNOSIS — Z9981 Dependence on supplemental oxygen: Secondary | ICD-10-CM | POA: Insufficient documentation

## 2015-11-10 DIAGNOSIS — J9601 Acute respiratory failure with hypoxia: Secondary | ICD-10-CM | POA: Diagnosis not present

## 2015-11-10 DIAGNOSIS — H539 Unspecified visual disturbance: Secondary | ICD-10-CM | POA: Diagnosis not present

## 2015-11-10 DIAGNOSIS — D571 Sickle-cell disease without crisis: Secondary | ICD-10-CM | POA: Diagnosis not present

## 2015-11-10 DIAGNOSIS — Z86718 Personal history of other venous thrombosis and embolism: Secondary | ICD-10-CM | POA: Diagnosis not present

## 2015-11-10 DIAGNOSIS — R4182 Altered mental status, unspecified: Secondary | ICD-10-CM | POA: Diagnosis present

## 2015-11-10 DIAGNOSIS — R270 Ataxia, unspecified: Secondary | ICD-10-CM | POA: Diagnosis not present

## 2015-11-10 DIAGNOSIS — Z9049 Acquired absence of other specified parts of digestive tract: Secondary | ICD-10-CM | POA: Diagnosis not present

## 2015-11-10 DIAGNOSIS — J9621 Acute and chronic respiratory failure with hypoxia: Secondary | ICD-10-CM | POA: Diagnosis present

## 2015-11-10 DIAGNOSIS — R4701 Aphasia: Secondary | ICD-10-CM | POA: Insufficient documentation

## 2015-11-10 DIAGNOSIS — D72829 Elevated white blood cell count, unspecified: Secondary | ICD-10-CM | POA: Diagnosis not present

## 2015-11-10 DIAGNOSIS — R4781 Slurred speech: Secondary | ICD-10-CM | POA: Diagnosis present

## 2015-11-10 DIAGNOSIS — I10 Essential (primary) hypertension: Secondary | ICD-10-CM | POA: Diagnosis present

## 2015-11-10 DIAGNOSIS — R7989 Other specified abnormal findings of blood chemistry: Secondary | ICD-10-CM | POA: Diagnosis not present

## 2015-11-10 DIAGNOSIS — I272 Pulmonary hypertension, unspecified: Secondary | ICD-10-CM | POA: Insufficient documentation

## 2015-11-10 DIAGNOSIS — R930 Abnormal findings on diagnostic imaging of skull and head, not elsewhere classified: Secondary | ICD-10-CM | POA: Diagnosis not present

## 2015-11-10 DIAGNOSIS — Z79899 Other long term (current) drug therapy: Secondary | ICD-10-CM | POA: Insufficient documentation

## 2015-11-10 DIAGNOSIS — D5701 Hb-SS disease with acute chest syndrome: Secondary | ICD-10-CM

## 2015-11-10 DIAGNOSIS — N179 Acute kidney failure, unspecified: Secondary | ICD-10-CM | POA: Diagnosis present

## 2015-11-10 DIAGNOSIS — R0902 Hypoxemia: Secondary | ICD-10-CM | POA: Diagnosis present

## 2015-11-10 DIAGNOSIS — R0489 Hemorrhage from other sites in respiratory passages: Secondary | ICD-10-CM | POA: Diagnosis not present

## 2015-11-10 DIAGNOSIS — R778 Other specified abnormalities of plasma proteins: Secondary | ICD-10-CM | POA: Diagnosis present

## 2015-11-10 DIAGNOSIS — E0590 Thyrotoxicosis, unspecified without thyrotoxic crisis or storm: Secondary | ICD-10-CM | POA: Diagnosis not present

## 2015-11-10 DIAGNOSIS — D638 Anemia in other chronic diseases classified elsewhere: Secondary | ICD-10-CM | POA: Diagnosis present

## 2015-11-10 DIAGNOSIS — Z7982 Long term (current) use of aspirin: Secondary | ICD-10-CM | POA: Diagnosis not present

## 2015-11-10 DIAGNOSIS — Q211 Atrial septal defect: Secondary | ICD-10-CM | POA: Diagnosis not present

## 2015-11-10 DIAGNOSIS — I5022 Chronic systolic (congestive) heart failure: Secondary | ICD-10-CM | POA: Diagnosis not present

## 2015-11-10 DIAGNOSIS — I6349 Cerebral infarction due to embolism of other cerebral artery: Secondary | ICD-10-CM | POA: Diagnosis not present

## 2015-11-10 DIAGNOSIS — I2699 Other pulmonary embolism without acute cor pulmonale: Secondary | ICD-10-CM | POA: Diagnosis not present

## 2015-11-10 DIAGNOSIS — H53461 Homonymous bilateral field defects, right side: Secondary | ICD-10-CM | POA: Diagnosis not present

## 2015-11-10 DIAGNOSIS — R471 Dysarthria and anarthria: Secondary | ICD-10-CM | POA: Diagnosis not present

## 2015-11-10 DIAGNOSIS — Z86711 Personal history of pulmonary embolism: Secondary | ICD-10-CM | POA: Diagnosis not present

## 2015-11-10 DIAGNOSIS — I2729 Other secondary pulmonary hypertension: Secondary | ICD-10-CM | POA: Diagnosis not present

## 2015-11-10 DIAGNOSIS — Z8673 Personal history of transient ischemic attack (TIA), and cerebral infarction without residual deficits: Secondary | ICD-10-CM | POA: Diagnosis not present

## 2015-11-10 LAB — LIPID PANEL
CHOL/HDL RATIO: 4.4 ratio
CHOLESTEROL: 87 mg/dL (ref 0–200)
HDL: 20 mg/dL — ABNORMAL LOW (ref >40)
LDL Cholesterol: 46 mg/dL (ref 0–99)
Triglycerides: 103 mg/dL (ref <150)
VLDL: 21 mg/dL (ref 0–40)

## 2015-11-10 LAB — I-STAT CHEM 8, ED
BUN: 21 mg/dL — AB (ref 6–20)
CALCIUM ION: 1.13 mmol/L — AB (ref 1.15–1.40)
CREATININE: 1.5 mg/dL — AB (ref 0.61–1.24)
Chloride: 110 mmol/L (ref 101–111)
GLUCOSE: 127 mg/dL — AB (ref 65–99)
HCT: 24 % — ABNORMAL LOW (ref 39.0–52.0)
Hemoglobin: 8.2 g/dL — ABNORMAL LOW (ref 13.0–17.0)
Potassium: 4.3 mmol/L (ref 3.5–5.1)
Sodium: 143 mmol/L (ref 135–145)
TCO2: 24 mmol/L (ref 0–100)

## 2015-11-10 LAB — CBC
HEMATOCRIT: 22.9 % — AB (ref 39.0–52.0)
HEMOGLOBIN: 7.6 g/dL — AB (ref 13.0–17.0)
MCH: 30.3 pg (ref 26.0–34.0)
MCHC: 33.2 g/dL (ref 30.0–36.0)
MCV: 91.2 fL (ref 78.0–100.0)
Platelets: 439 10*3/uL — ABNORMAL HIGH (ref 150–400)
RBC: 2.51 MIL/uL — AB (ref 4.22–5.81)
RDW: 19.6 % — ABNORMAL HIGH (ref 11.5–15.5)
WBC: 22.4 10*3/uL — AB (ref 4.0–10.5)

## 2015-11-10 LAB — I-STAT ARTERIAL BLOOD GAS, ED
ACID-BASE DEFICIT: 2 mmol/L (ref 0.0–2.0)
Bicarbonate: 22.7 mmol/L (ref 20.0–28.0)
O2 SAT: 89 %
PCO2 ART: 35.2 mmHg (ref 32.0–48.0)
PH ART: 7.415 (ref 7.350–7.450)
TCO2: 24 mmol/L (ref 0–100)
pO2, Arterial: 54 mmHg — ABNORMAL LOW (ref 83.0–108.0)

## 2015-11-10 LAB — COMPREHENSIVE METABOLIC PANEL
ALK PHOS: 70 U/L (ref 38–126)
ALT: 21 U/L (ref 17–63)
AST: 35 U/L (ref 15–41)
Albumin: 4 g/dL (ref 3.5–5.0)
Anion gap: 4 — ABNORMAL LOW (ref 5–15)
BUN: 18 mg/dL (ref 6–20)
CALCIUM: 9 mg/dL (ref 8.9–10.3)
CO2: 23 mmol/L (ref 22–32)
CREATININE: 1.31 mg/dL — AB (ref 0.61–1.24)
Chloride: 113 mmol/L — ABNORMAL HIGH (ref 101–111)
Glucose, Bld: 127 mg/dL — ABNORMAL HIGH (ref 65–99)
Potassium: 4.2 mmol/L (ref 3.5–5.1)
Sodium: 140 mmol/L (ref 135–145)
Total Bilirubin: 3.8 mg/dL — ABNORMAL HIGH (ref 0.3–1.2)
Total Protein: 7 g/dL (ref 6.5–8.1)

## 2015-11-10 LAB — URINALYSIS, ROUTINE W REFLEX MICROSCOPIC
Bilirubin Urine: NEGATIVE
GLUCOSE, UA: NEGATIVE mg/dL
HGB URINE DIPSTICK: NEGATIVE
KETONES UR: NEGATIVE mg/dL
LEUKOCYTES UA: NEGATIVE
Nitrite: NEGATIVE
PROTEIN: NEGATIVE mg/dL
Specific Gravity, Urine: 1.013 (ref 1.005–1.030)
pH: 6.5 (ref 5.0–8.0)

## 2015-11-10 LAB — PREPARE RBC (CROSSMATCH)

## 2015-11-10 LAB — DIFFERENTIAL
BASOS ABS: 0.1 10*3/uL (ref 0.0–0.1)
Basophils Relative: 1 %
Eosinophils Absolute: 0 10*3/uL (ref 0.0–0.7)
Eosinophils Relative: 0 %
LYMPHS ABS: 0.5 10*3/uL — AB (ref 0.7–4.0)
LYMPHS PCT: 2 %
MONO ABS: 2.5 10*3/uL — AB (ref 0.1–1.0)
MONOS PCT: 11 %
NEUTROS ABS: 19.3 10*3/uL — AB (ref 1.7–7.7)
Neutrophils Relative %: 86 %

## 2015-11-10 LAB — PROTIME-INR
INR: 1.37
Prothrombin Time: 16.9 seconds — ABNORMAL HIGH (ref 11.4–15.2)

## 2015-11-10 LAB — LACTIC ACID, PLASMA: Lactic Acid, Venous: 0.9 mmol/L (ref 0.5–1.9)

## 2015-11-10 LAB — I-STAT TROPONIN, ED: TROPONIN I, POC: 0.35 ng/mL — AB (ref 0.00–0.08)

## 2015-11-10 LAB — APTT: aPTT: 70 seconds — ABNORMAL HIGH (ref 24–36)

## 2015-11-10 LAB — HEPARIN LEVEL (UNFRACTIONATED): Heparin Unfractionated: <0.1 IU/mL — ABNORMAL LOW (ref 0.30–0.70)

## 2015-11-10 LAB — I-STAT CG4 LACTIC ACID, ED: LACTIC ACID, VENOUS: 0.84 mmol/L (ref 0.5–1.9)

## 2015-11-10 LAB — RAPID URINE DRUG SCREEN, HOSP PERFORMED
Amphetamines: NOT DETECTED
BARBITURATES: NOT DETECTED
BENZODIAZEPINES: NOT DETECTED
COCAINE: NOT DETECTED
Opiates: POSITIVE — AB
TETRAHYDROCANNABINOL: POSITIVE — AB

## 2015-11-10 LAB — ETHANOL

## 2015-11-10 MED ORDER — HEPARIN BOLUS VIA INFUSION
2500.0000 [IU] | Freq: Once | INTRAVENOUS | Status: AC
  Administered 2015-11-10: 2500 [IU] via INTRAVENOUS
  Filled 2015-11-10: qty 2500

## 2015-11-10 MED ORDER — GABAPENTIN 300 MG PO CAPS: 300.0000 mg | ORAL_CAPSULE | Freq: Three times a day (TID) | ORAL | Status: DC

## 2015-11-10 MED ORDER — FOLIC ACID 1 MG PO TABS: 1.0000 mg | ORAL_TABLET | Freq: Every morning | ORAL | Status: DC

## 2015-11-10 MED ORDER — IOPAMIDOL (ISOVUE-370) INJECTION 76%
INTRAVENOUS | Status: AC
  Administered 2015-11-10: 100 mL
  Filled 2015-11-10: qty 100

## 2015-11-10 MED ORDER — FENTANYL CITRATE (PF) 100 MCG/2ML IJ SOLN
50.0000 ug | Freq: Once | INTRAMUSCULAR | Status: DC
  Filled 2015-11-10: qty 2

## 2015-11-10 MED ORDER — DEXTROSE 5 % IV SOLN: 2.0000 g | Freq: Three times a day (TID) | INTRAVENOUS | Status: DC

## 2015-11-10 MED ORDER — MORPHINE SULFATE ER 30 MG PO TBCR: 30.0000 mg | EXTENDED_RELEASE_TABLET | Freq: Two times a day (BID) | ORAL | Status: DC

## 2015-11-10 MED ORDER — VANCOMYCIN HCL IN DEXTROSE 1-5 GM/200ML-% IV SOLN: 1000.0000 mg | Freq: Two times a day (BID) | INTRAVENOUS | Status: DC

## 2015-11-10 MED ORDER — HEPARIN (PORCINE) IN NACL 100-0.45 UNIT/ML-% IJ SOLN
1100.0000 [IU]/h | INTRAMUSCULAR | Status: DC
  Administered 2015-11-10: 1100 [IU]/h via INTRAVENOUS
  Filled 2015-11-10: qty 250

## 2015-11-10 MED ORDER — ONDANSETRON HCL 4 MG PO TABS: 4.0000 mg | ORAL_TABLET | Freq: Four times a day (QID) | ORAL | Status: DC | PRN

## 2015-11-10 MED ORDER — ONDANSETRON HCL 4 MG/2ML IJ SOLN
4.0000 mg | Freq: Four times a day (QID) | INTRAMUSCULAR | Status: DC | PRN
Start: 2015-11-10 — End: 2015-11-10

## 2015-11-10 MED ORDER — VITAMIN D 50 MCG (2000 UT) PO TABS: 2000.0000 [IU] | ORAL_TABLET | Freq: Every day | ORAL | Status: DC

## 2015-11-10 MED ORDER — SODIUM CHLORIDE 0.9 % IV BOLUS (SEPSIS)
500.0000 mL | Freq: Once | INTRAVENOUS | Status: AC
  Administered 2015-11-10: 500 mL via INTRAVENOUS

## 2015-11-10 MED ORDER — HYDROMORPHONE HCL 2 MG PO TABS: 4.0000 mg | ORAL_TABLET | ORAL | Status: DC | PRN

## 2015-11-10 MED ORDER — SODIUM CHLORIDE 0.9 % IV SOLN
Freq: Once | INTRAVENOUS | Status: DC
Start: 2015-11-10 — End: 2015-11-10

## 2015-11-10 MED ORDER — ZOLPIDEM TARTRATE 5 MG PO TABS: 10.0000 mg | ORAL_TABLET | Freq: Every evening | ORAL | Status: DC | PRN

## 2015-11-10 MED ORDER — BISACODYL 5 MG PO TBEC: 5.0000 mg | DELAYED_RELEASE_TABLET | Freq: Every day | ORAL | Status: DC | PRN

## 2015-11-10 MED ORDER — SODIUM CHLORIDE 0.9 % IV BOLUS (SEPSIS): 500.0000 mL | Freq: Once | INTRAVENOUS | Status: DC

## 2015-11-10 MED ORDER — SODIUM CHLORIDE 0.9% FLUSH: 3.0000 mL | Freq: Two times a day (BID) | INTRAVENOUS | Status: DC

## 2015-11-10 MED ORDER — DEFERASIROX 360 MG PO TABS: 1080.0000 mg | ORAL_TABLET | Freq: Every day | ORAL | Status: DC

## 2015-11-10 MED ORDER — AMBRISENTAN 5 MG PO TABS: 5.0000 mg | ORAL_TABLET | Freq: Every day | ORAL | Status: DC

## 2015-11-10 MED ORDER — VANCOMYCIN HCL 10 G IV SOLR
1500.0000 mg | Freq: Once | INTRAVENOUS | Status: DC
  Filled 2015-11-10: qty 1500

## 2015-11-10 MED ORDER — SODIUM CHLORIDE 0.9 % IV SOLN
INTRAVENOUS | Status: DC
  Administered 2015-11-10: 12:00:00 via INTRAVENOUS

## 2015-11-10 NOTE — ED Notes (Signed)
Pt request blood draw from port 

## 2015-11-10 NOTE — Progress Notes (Signed)
ANTICOAGULATION CONSULT NOTE - Initial Consult  Pharmacy Consult for heparin Indication: pulmonary embolus  No Known Allergies  Patient Measurements:   Heparin Dosing Weight: 79.4  Vital Signs: Temp: 98 F (36.7 C) (10/10 1022) Temp Source: Axillary (10/10 1022) BP: 115/81 (10/10 1230) Pulse Rate: 115 (10/10 1230)  Labs:  Recent Labs  11/10/15 1056 11/10/15 1108  HGB 7.6* 8.2*  HCT 22.9* 24.0*  PLT 439*  --   APTT 70*  --   LABPROT 16.9*  --   INR 1.37  --   CREATININE 1.31* 1.50*    Estimated Creatinine Clearance: 74.7 mL/min (by C-G formula based on SCr of 1.5 mg/dL (H)). Patient appears to have AKI with SCr elevated from 0.7 to 1.5 over past 4 days.   Medical History: Past Medical History:  Diagnosis Date  . Acute chest syndrome ([place]) 06/18/2013  . Acute embolism and thrombosis of right internal jugular vein ([place])   . Alcohol consumption of one to four drinks per day   . Avascular necrosis (HCC)    Right Hip  . Blood transfusion   . Chronic anticoagulation   . Demand ischemia ([place]) 01/02/2014  . Former smoker   . Functional asplenia   . Hb-SS disease with crisis ([place])   . History of Clostridium difficile infection   . History of pulmonary embolus (PE)   . Hypertension   . Hypokalemia   . Leukocytosis    Chronic  . Mood disorder ([place])   . Noncompliance with medication regimen   . Oxygen deficiency   . Pulmonary hypertension   . Second hand tobacco smoke exposure   . Sickle cell anemia (HCC)   . Sickle-cell crisis with associated acute chest syndrome ([place]) 05/13/2013  . Thrombocytosis (HCC)    Chronic  . Uses marijuana     Medications:   (Not in a hospital admission)  Assessment: Pt has PE confirmed by CT in RML, RLL, possibly LLL. APTT currently 70, on xarelto 20 mg d/t previous PE. Pt has previously been therapeutic on heparin Iv 1000 units/hr. Higher dose given d/t active PE. Head CT negative for bleed.  AKI with SCr 0.7 to 1.5 over past 4  days.  MD requested half strength bolus d/t clinically significant PE with pain, SOB, low O2 Sat.  Goal of Therapy:  Heparin level 0.3-0.7 units/ml Monitor platelets by anticoagulation protocol: Yes   Plan:  Heparin bolus 2500 units x1 Start heparin infusion at 1100 units/hr  Obtain heparin level and aPTT at steady state  Cheral Almas, PharmD Candidate 11/10/2015,1:40 PM

## 2015-11-10 NOTE — Consult Note (Signed)
Consult Note    [name] [id number] DOB: 1980-06-01 DOA: 11/10/2015  PCP: [name], MD Patient coming from: home/sickle cell clinic  Chief Complaint: slurred speech/sob  HPI: [name] is a 35 y.o. male with medical history significant sickle cell, ACS, DVT, pulmonary hypertension, noncompliance, mood disorder, hypertension, PE on chronic anticoagulation with Xarelto, A. fib, pain crisis and pain medicine with multiple admissions, chronic respiratory failure noncompliant with home oxygen since emergency Department chief complaint of slurred speech. Initial evaluation reveals acute respiratory failure with increased oxygen demand slurred speech CT of the chest revealing bilateral pulmonary emboli concerning for right heart strain and continued slurred speech.  Information is obtained from the patient and his mother who is at the bedside. He was in his usual state of health until 6 AM this morning mother described patient as being "out of it. She noted that his speech was slurry and he seemed confused. Patient has no recollection of the events. Mother reports he was fine when he went to bed last night at midnight. Ration denies any unusual pain other than his chronic left rib pain. He also endorses some productive cough. He denies headache dizziness syncope or near-syncope. He denies feeling short of breath feverous nauseated. He denies any abdominal pain vomiting diarrhea constipation dysuria hematuria frequency or urgency. As a numbness or tingling of his face lips or tongue. He denies any difficulty swallowing. He denies any weakness in his extremities.   ED Course: Emergency department he's afebrile his oxygen saturation levels 92% on 4 L. Hemodynamically stable.  Review of Systems: As per HPI otherwise 10 point review of systems negative.   Ambulatory Status: He ambulates independently with no recent falls  Past Medical History:  Diagnosis Date  . Acute chest  syndrome ([place]) 06/18/2013  . Acute embolism and thrombosis of right internal jugular vein ([place])   . Alcohol consumption of one to four drinks per day   . Avascular necrosis (HCC)    Right Hip  . Blood transfusion   . Chronic anticoagulation   . Demand ischemia ([place]) 01/02/2014  . Former smoker   . Functional asplenia   . Hb-SS disease with crisis ([place])   . History of Clostridium difficile infection   . History of pulmonary embolus (PE)   . Hypertension   . Hypokalemia   . Leukocytosis    Chronic  . Mood disorder ([place])   . Noncompliance with medication regimen   . Oxygen deficiency   . Pulmonary hypertension   . Second hand tobacco smoke exposure   . Sickle cell anemia (HCC)   . Sickle-cell crisis with associated acute chest syndrome ([place]) 05/13/2013  . Thrombocytosis (HCC)    Chronic  . Uses marijuana     Past Surgical History:  Procedure Laterality Date  . CHOLECYSTECTOMY     01/2008  . Excision of left periauricular cyst     10/2009  . Excision of right ear lobe cyst with primary closur     11/2007  . Porta cath placement    . Porta cath removal    . PORTACATH PLACEMENT  01/05/2012   Procedure: INSERTION PORT-A-CATH;  Surgeon: Odis Hollingshead, MD;  Location: [facility];  Service: General;  Laterality: N/A;  ultrasound guiced port a cath insertion with fluoroscopy  . Right hip replacement     08/2006  . UMBILICAL HERNIA REPAIR     01/2008    Social History   Social History  . Marital status: Single  Spouse name: N/A  . Number of children: 0  . Years of education: 15   Occupational History  . 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 Smoking status: Former Smoker    Packs/day: 0.50    Years: 10.00    Types: Cigarettes    Quit date: 05/29/2011  . Smokeless tobacco: Never Used  . Alcohol use No  . Drug use: No     Comment: Marijuana weekly  . Sexual activity: Yes    Partners: Female     Comment:  month ago   Other Topics Concern  . Not on file   Social History Narrative   Lives in an apartment.  Single.  Lives alone but has a girlfriend that helps care for him.  Does not use any assist devices.        [name]  [phone] Mom, emergency contact      [facility]:   Patient continuing to live in her apartment in town alone. 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    No Known Allergies  Family History  Problem Relation Age of Onset  . Sickle cell trait Mother   . Depression Mother   . Diabetes Mother   . Sickle cell trait Father   . Sickle cell trait Brother     Prior to Admission medications   Medication Sig Start Date End Date Taking? Authorizing Provider  ambrisentan (LETAIRIS) 5 MG tablet Take 5 mg by mouth at bedtime.    Historical Provider, MD  aspirin 81 MG chewable tablet Chew 1 tablet (81 mg total) by mouth daily. 07/23/14   [name], MD  Cholecalciferol (VITAMIN D) 2000 units tablet Take 1 tablet (2,000 Units total) by mouth daily. 02/23/15   Dorena Dew, FNP  Deferasirox (JADENU) 360 MG TABS Take 1,080 mg by mouth daily.     Historical Provider, MD  folic acid (FOLVITE) 1 MG tablet Take 1 tablet (1 mg total) by mouth every morning. 02/23/15   Dorena Dew, FNP  gabapentin (NEURONTIN) 300 MG capsule Take 1 capsule (300 mg total) by mouth 3 (three) times daily. 06/01/15   Tresa Garter, MD  HYDROmorphone (DILAUDID) 4 MG tablet Take 1 tablet (4 mg total) by mouth every 4 (four) hours as needed for severe pain. 11/06/15   Tresa Garter, MD  lisinopril (PRINIVIL,ZESTRIL) 5 MG tablet Take 1 tablet (5 mg total) by mouth daily. 08/04/15   [name], MD  morphine (MS CONTIN) 30 MG 12 hr tablet Take 1 tablet (30 mg total) by mouth every 12 (twelve) hours. 11/06/15   Tresa Garter, MD  rivaroxaban (XARELTO) 20 MG TABS tablet Take 20 mg by mouth daily.     Historical Provider, MD  torsemide (DEMADEX) 20 MG tablet Take 20 mg  by mouth daily as needed. For increase in patient weight greater than 3 lbs. 07/27/15 07/26/16  Historical Provider, MD  zolpidem (AMBIEN) 10 MG tablet Take 1 tablet (10 mg total) by mouth at bedtime as needed for sleep. 11/07/15 12/07/15  Tresa Garter, MD    Physical Exam: Vitals:   11/10/15 1130 11/10/15 1200 11/10/15 1230 11/10/15 1330  BP: 121/81 114/72 115/81 91/65  Pulse: 103 99 115 94  Resp: 20 18 21 16   Temp:      TempSrc:      SpO2: 95% 94% 94% 96%  General:  Appears calm and comfortable, no acute distress Eyes:  PERRL, EOMI, normal lids, iris ENT:  grossly normal hearing, lips & tongue, mucous membranes of his mouth are slightly dry pink Neck:  no LAD, masses or thyromegaly Cardiovascular:  Tachycardic but regular, no m/r/g. No LE edema.  Respiratory:  Mild increased work of breathing. Breath sounds somewhat diminished. Good air movement. I hear no wheezing Abdomen:  soft, ntnd, positive bowel sounds no guarding or rebounding Skin:  no rash or induration seen on limited exam Musculoskeletal:  grossly normal tone BUE/BLE, good ROM, no bony abnormality Psychiatric:  grossly normal mood and affect, speech fluent and appropriate, AOx3 Neurologic:  Facial symmetry tongue midline some slurred speech bilateral grip 5 out of 5 lower extremity strength 5 out of 5 oriented 3  Labs on Admission: I have personally reviewed following labs and imaging studies  CBC:  Recent Labs Lab 11/06/15 0414 11/10/15 1056 11/10/15 1108  WBC 14.7* 22.4*  --   NEUTROABS 10.3* 19.3*  --   HGB 8.3* 7.6* 8.2*  HCT 24.5* 22.9* 24.0*  MCV 89.7 91.2  --   PLT 430* 439*  --    Basic Metabolic Panel:  Recent Labs Lab 11/06/15 0414 11/10/15 1056 11/10/15 1108  NA 138 140 143  K 3.5 4.2 4.3  CL 106 113* 110  CO2 26 23  --   GLUCOSE 103* 127* 127*  BUN 10 18 21*  CREATININE 0.68 1.31* 1.50*  CALCIUM 9.0 9.0  --    GFR: Estimated Creatinine Clearance: 74.7 mL/min (by C-G formula  based on SCr of 1.5 mg/dL (H)). Liver Function Tests:  Recent Labs Lab 11/10/15 1056  AST 35  ALT 21  ALKPHOS 70  BILITOT 3.8*  PROT 7.0  ALBUMIN 4.0   No results for input(s): LIPASE, AMYLASE in the last 168 hours. No results for input(s): AMMONIA in the last 168 hours. Coagulation Profile:  Recent Labs Lab 11/10/15 1056  INR 1.37   Cardiac Enzymes: No results for input(s): CKTOTAL, CKMB, CKMBINDEX, TROPONINI in the last 168 hours. BNP (last 3 results) No results for input(s): PROBNP in the last 8760 hours. HbA1C: No results for input(s): HGBA1C in the last 72 hours. CBG: No results for input(s): GLUCAP in the last 168 hours. Lipid Profile: No results for input(s): CHOL, HDL, LDLCALC, TRIG, CHOLHDL, LDLDIRECT in the last 72 hours. Thyroid Function Tests: No results for input(s): TSH, T4TOTAL, FREET4, T3FREE, THYROIDAB in the last 72 hours. Anemia Panel: No results for input(s): VITAMINB12, FOLATE, FERRITIN, TIBC, IRON, RETICCTPCT in the last 72 hours. Urine analysis:    Component Value Date/Time   COLORURINE AMBER (A) 11/10/2015 1120   APPEARANCEUR CLEAR 11/10/2015 1120   LABSPEC 1.013 11/10/2015 1120   PHURINE 6.5 11/10/2015 1120   GLUCOSEU NEGATIVE 11/10/2015 1120   HGBUR NEGATIVE 11/10/2015 1120   BILIRUBINUR NEGATIVE 11/10/2015 1120   KETONESUR NEGATIVE 11/10/2015 1120   PROTEINUR NEGATIVE 11/10/2015 1120   UROBILINOGEN 0.2 03/24/2015 1426   NITRITE NEGATIVE 11/10/2015 1120   LEUKOCYTESUR NEGATIVE 11/10/2015 1120    Creatinine Clearance: Estimated Creatinine Clearance: 74.7 mL/min (by C-G formula based on SCr of 1.5 mg/dL (H)).  Sepsis Labs: @LABRCNTIP (procalcitonin:4,lacticidven:4) )No results found for this or any previous visit (from the past 240 hour(s)).   Radiological Exams on Admission: Ct Head Wo Contrast  Result Date: 11/10/2015 CLINICAL DATA:  Left-sided weakness. EXAM: CT HEAD WITHOUT CONTRAST TECHNIQUE: Contiguous axial images were  obtained from the base of the skull through  the vertex without intravenous contrast. COMPARISON:  None. FINDINGS: Brain: No evidence of acute infarction, hemorrhage, extra-axial collection, ventriculomegaly, or mass effect. Old left basal ganglia 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 Generalized cerebral atrophy. Periventricular white matter low attenuation likely secondary to microangiopathy. Vascular: No significant cerebral vascular atherosclerotic calcifications are noted. Skull: Negative for fracture or focal lesion. Sinuses/Orbits: Visualized portions of the orbits are unremarkable. Visualized portions of the paranasal sinuses and mastoid air cells are unremarkable. Other: None. IMPRESSION: 1. No acute intracranial pathology. Electronically Signed   By: Kathreen Devoid   On: 11/10/2015 11:16   Ct Angio Chest Pe W/cm &/or Wo Cm  Result Date: 11/10/2015 CLINICAL DATA:  Left flank pain and lethargy. Difficulty walking and facial droop with slurred speech. Hypoxia. Shortness of breath. Sickle cell anemia. EXAM: CT ANGIOGRAPHY CHEST WITH CONTRAST TECHNIQUE: Multidetector CT imaging of the chest was performed using the standard protocol during bolus administration of intravenous contrast. Multiplanar CT image reconstructions and MIPs were obtained to evaluate the vascular anatomy. CONTRAST:  65 cc Isovue 370 COMPARISON:  Multiple exams, including 11/10/2015 chest radiograph, and CT of 11/30/2014 FINDINGS: Cardiovascular: Filling defect compatible with acute pulmonary embolus in the right middle lobe, right lower lobe, and potentially left lower lobe signal told pulmonary vessels. Marked cardiomegaly with elongation of the heart, RV:LV ratio 1.5 (abnormally elevated). Small pericardial effusion. Enlarged main pulmonary artery at 4.5 cm transverse. Left Port-A-Cath noted, tip a in the right atrium. Mediastinum/Nodes: Mass or confluent nodularity in the anterior mediastinum including a 1.7 by 4.5 cm  anterior mediastinal component on image 50/4, confluent lymph nodes were also present previously but somewhat less striking. Right hilar node 1.3 cm in short axis on image 57/4, previously 0.6 cm. A posterior right hilar node measures 1.4 cm in short axis on image 64/4, formerly 0.8 cm. Lungs/Pleura: Peripheral foraminal airspace opacity at the right lung apex is new possibly from pulmonary hemorrhage. There is bilateral primarily perihilar ground-glass opacities and patchy sub solid opacities in both lower lobes and to a lesser extent peripherally in the upper lobes. Likely a combination of edema and pulmonary hemorrhage along with scattered atelectasis. No significant pleural effusion. Upper Abdomen: Diffuse calcification of the spleen, similar to prior. 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 Endplate notching of the thoracic vertebra characteristic of sickle cell disease. Review of the MIP images confirms the above findings. IMPRESSION: 1. Acute pulmonary embolus is present in segmental branches of the right middle lobe and right lower lobe, and possibly the left lower lobe on today's exam. Positive for acute PE with CT evidence of right heart strain (RV/LV Ratio = 1.5) consistent with at least submassive (intermediate risk) PE. The presence of right heart strain has been associated with an increased risk of morbidity and mortality. Please activate Code PE by paging [phone]. 2. Stable marked cardiomegaly. 3. Small pericardial effusion. 4. Enlarged main pulmonary artery at 4.5 cm transverse. 5. Patchy ground-glass and peripheral opacities in both lungs likely a combination of mild pulmonary edema, pulmonary hemorrhage, and scattered atelectasis. 6. Osseous findings of sickle cell disease. 7. There are enlarged mediastinal and right hilar lymph nodes, increased from prior. These may merit follow up in the nonacute setting to ensure resolution and exclude the  possibility of entities such as lymphoproliferative disease. Critical Value/emergent results were called by telephone at the time of interpretation on 11/10/2015 at 1:06 pm to Dr. Threasa Beards BELFI , who verbally acknowledged these results. Electronically Signed  By: Van Clines M.D.   On: 11/10/2015 13:08   Dg Chest Port 1 View  Result Date: 11/10/2015 CLINICAL DATA:  History of sickle cell disease, now with shortness of breath and hypoxia. EXAM: PORTABLE CHEST 1 VIEW COMPARISON:  10/28/2015; 09/26/2015; 08/07/2015 FINDINGS: Similar findings of enlarged cardiac silhouette and mediastinal contours given reduced lung volumes. Pulmonary vasculature is indistinct with cephalization of flow. Grossly unchanged bilateral infrahilar heterogeneous opacities favored to represent atelectasis. Stable position of support apparatus. Trace pleural effusions are not excluded. No pneumothorax. 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 IMPRESSION: 1. Findings worrisome for pulmonary edema on this AP portable examination. Note, atypical infection could have a similar appearance. Further evaluation with a PA and lateral chest radiograph may be obtained as clinically indicated. 2. Similar findings of cardiomegaly. Electronically Signed   By: Sandi Mariscal M.D.   On: 11/10/2015 10:49    EKG: Independently reviewed. Sinus tachycardia Left atrial enlargement Probable right ventricular hypertrophy Inferior infarct, old Abnormal lateral Q waves Abnormal T, consider ischemia, anterior leads Prolonged QT interval  Assessment/Plan Principal Problem:   Acute respiratory failure (HCC) Active Problems:   Chronic anticoagulation   Essential hypertension   Elevated troponin   Chronic pain syndrome   Pulmonary embolus (HCC)   Sickle cell anemia (HCC)   Anemia of chronic disease   Chronic atrial fibrillation (HCC)   Acute kidney injury ([place])   Hypoxia   Slurred speech    Pulmonary emboli (HCC)    CASE DISCUSSED WITH DR [name] [facility] WHO SPOKE WITH PATIENTS PROVIDER AT [facility] WHO RECOMMEND EMERGENT TRANSFER TO [facility] RED CELL APHERESIS.  #1. Acute respiratory failure with hypoxia. Patient with increased oxygen demand. CT evidence of right heart strain and possible pulmonary hemorrhage in setting of sickle cell crisis. Concern for acute chest syndrome and possible pna. He is afebrile but with leukocytosis. Recent cough. HG 8.2. Hx DVT and PE on Xarelto. -continue heparin drip initiated in ED.  -will obtain blood cultures -lactic acid and pro-calcitonin -Vancomycin and cefepime per pharmacy -echo -1 unit PRBC's -Oxygen supplementation -Transfer to [facility]   #2. Slurred speech CVA.  MRI reveals background pattern of old infarctions throughout the cerebellum and old lacunar infarctions affecting the thalami basal ganglia. Multiple acute infarctions including areas of involvement in the left cerebellum, left dorsal pons midbrain junction, left occipital lobe, the left corpus callosum or cingulate gyrus, and a few small punctate foci in the left frontoparietal cortex. Findings are most consistent with embolic disease from the heart or ascending aorta given the combination of anterior and posterior circulation Infarctions. -see #1  #3. Sickle cell anemia/sickle cell crisis. Patient scheduled to go to [facility] today. Case discussed with Dr. Zigmund Daniel with sickle cell clinic at [facility] who discussed with [facility] who recommended emergent transfer -transfer to [place] -1 unit PRBC's -Pain management  #4. AKI.  creatinine 1.5. Likely related to above -Gentle IV fluids -Hold nephrotoxins -Monitor  5. Elevated troponin. Likely related to above. -Cycle -serial EKG  #6. Hypertension. Blood pressure somewhat soft in the emergency department -Hold lisinopril     DVT prophylaxis: heparin gtt Code Status: full  Family Communication: mother at bedside    Disposition Plan: home  Consults called: kirpatrick  Admission status: inpai    Radene Gunning MD [facility]  If 7PM-7AM, please contact night-coverage [email or web address] Password [email or web address]  11/10/2015, 3:56 PM

## 2015-11-10 NOTE — Progress Notes (Signed)
Pharmacy Antibiotic Note  [name] is a 35 y.o. male admitted on 11/10/2015 with pneumonia.  Pharmacy has been consulted for vancomycin and cefepime dosing. Pt has hx of hospital admissions recently and is also being treated for PE with associated SOB and low O2 sats. Chest Ct shows possible evidence of infection.   Plan: Vancomycin 1500 mg IV x1 Vancomycin 1000 mg IV q12h Cefepime 2g IV q8h     Temp (24hrs), Avg:98.2 F (36.8 C), Min:98 F (36.7 C), Max:98.4 F (36.9 C)   Recent Labs Lab 11/06/15 0414 11/10/15 1056 11/10/15 1108 11/10/15 1358  WBC 14.7* 22.4*  --   --   CREATININE 0.68 1.31* 1.50*  --   LATICACIDVEN  --   --   --  0.84    Estimated Creatinine Clearance: 74.7 mL/min (by C-G formula based on SCr of 1.5 mg/dL (H)).   Patient has AKI with SCr 0.7 to 1.5 over past 4 days.  No Known Allergies  Antimicrobials this admission:  Vancomycin 10/10 >> Cefepime 10/10 >>  Dose adjustments this admission:  N/A  Microbiology results:  10/10 BCx: Sent  Thank you for allowing pharmacy to be a part of this patient's care.  Cheral Almas, PharmD Candidate 11/10/2015 4:55 PM

## 2015-11-10 NOTE — ED Notes (Signed)
Placed pt on non-rebreather to increase SpO2 to 100%.  Weaned down to 6 L nasal cannula with SpO2 now at 93%.

## 2015-11-10 NOTE — ED Notes (Signed)
Notified Dr Tamera Punt of pt's presenting condition.

## 2015-11-10 NOTE — ED Provider Notes (Signed)
[facility] DEPT Provider Note   CSN: [id number] Arrival date & time: 11/10/15  1017     History   Chief Complaint Chief Complaint  Patient presents with  . Altered Mental Status    HPI [name] is a 35 y.o. male.  Patient is a 35 year old male with a history of sickle cell disease, acute chest syndrome, DVT, PE on Xarelto, pulmonary hypertension, cor pulmonale, chronic respiratory failure on home oxygen. He presented today to the sickle cell clinic for a follow-up appointment. The staff noticed that he would had a staggering gait and left facial droop on presentation. EMS was called and he was transferred here. He was noted to be hypoxic with oxygen saturations in the 70s and low 80s on EMS arrival. He was placed on oxygen and his oxygen saturations improved to the 90s. Reportedly, he is supposed to be on home oxygen at 2 L/m but he is noncompliant with this. He states that he was normal at midnight last night that was the last time he woke up. He woke up again at 5:55 AM and noticed that he was having trouble with his balance and his ambulation. He also noticed he was having trouble with his speech. He is currently on Xarelto for prior pulmonary embolus. He states he's had a stroke in the past but doesn't have residual symptoms. He denies any recent head injuries. He does have some pain in his back and ribs which is consistent with his sickle cell pain. He denies taking any medications today. He denies any alcohol or drug use. He states he was a drinker but his last alcohol use was about a year ago.    Altered Mental Status   Associated symptoms include weakness.    Past Medical History:  Diagnosis Date  . Acute chest syndrome ([place]) 06/18/2013  . Acute embolism and thrombosis of right internal jugular vein ([place])   . Alcohol consumption of one to four drinks per day   . Avascular necrosis (HCC)    Right Hip  . Blood transfusion   . Chronic anticoagulation   . Demand  ischemia ([place]) 01/02/2014  . Former smoker   . Functional asplenia   . Hb-SS disease with crisis ([place])   . History of Clostridium difficile infection   . History of pulmonary embolus (PE)   . Hypertension   . Hypokalemia   . Leukocytosis    Chronic  . Mood disorder ([place])   . Noncompliance with medication regimen   . Oxygen deficiency   . Pulmonary hypertension   . Second hand tobacco smoke exposure   . Sickle cell anemia (HCC)   . Sickle-cell crisis with associated acute chest syndrome ([place]) 05/13/2013  . Thrombocytosis (HCC)    Chronic  . Uses marijuana     Patient Active Problem List   Diagnosis Date Noted  . Slurred speech 11/10/2015  . Pulmonary emboli ([place]) 11/10/2015  . Sickle cell crisis ([place]) 10/27/2015  . Sickle cell pain crisis ([place]) 09/18/2015  . Sickle cell anemia with crisis ([place]) 09/04/2015  . Shortness of breath   . Pulmonary embolism without acute cor pulmonale ([place])   . Fever   . Sickle cell anemia with pain ([place]) 05/07/2015  . Hypoxia   . Anemia 01/31/2015  . Symptomatic anemia   . Hb-SS disease without crisis ([place]) 10/07/2014  . PVC's (premature ventricular contractions) 09/10/2014  . Abnormal EKG 09/10/2014  . Chronic atrial fibrillation ([place]) 09/09/2014  . Cor pulmonale, chronic ([place]) 08/25/2014  .  Sickle cell anemia ([place]) 06/25/2014  . Anemia of chronic disease 06/25/2014  . PAH (pulmonary artery hypertension) 03/18/2014  . Hb-SS disease with crisis ([place]) 01/22/2014  . Pulmonary embolus ([place]) 01/01/2014  . Paralytic strabismus, external ophthalmoplegia   . Chronic pain syndrome 12/12/2013  . Chronic anticoagulation 08/22/2013  . Essential hypertension 08/22/2013  . Pulmonary HTN 06/18/2013  . Functional asplenia   . Vitamin D deficiency 02/13/2013  . Hx of pulmonary embolus 06/29/2012  . Hemochromatosis 12/14/2011  . Avascular necrosis [facility])     Past Surgical History:  Procedure Laterality Date  . CHOLECYSTECTOMY     01/2008  .  Excision of left periauricular cyst     10/2009  . Excision of right ear lobe cyst with primary closur     11/2007  . Porta cath placement    . Porta cath removal    . PORTACATH PLACEMENT  01/05/2012   Procedure: INSERTION PORT-A-CATH;  Surgeon: Odis Hollingshead, MD;  Location: [facility];  Service: General;  Laterality: N/A;  ultrasound guiced port a cath insertion with fluoroscopy  . Right hip replacement     08/2006  . UMBILICAL HERNIA REPAIR     01/2008       Home Medications    Prior to Admission medications   Medication Sig Start Date End Date Taking? Authorizing Provider  ambrisentan (LETAIRIS) 5 MG tablet Take 5 mg by mouth at bedtime.    Historical Provider, MD  aspirin 81 MG chewable tablet Chew 1 tablet (81 mg total) by mouth daily. 07/23/14   [name], MD  Cholecalciferol (VITAMIN D) 2000 units tablet Take 1 tablet (2,000 Units total) by mouth daily. 02/23/15   Dorena Dew, FNP  Deferasirox (JADENU) 360 MG TABS Take 1,080 mg by mouth daily.     Historical Provider, MD  folic acid (FOLVITE) 1 MG tablet Take 1 tablet (1 mg total) by mouth every morning. 02/23/15   Dorena Dew, FNP  gabapentin (NEURONTIN) 300 MG capsule Take 1 capsule (300 mg total) by mouth 3 (three) times daily. 06/01/15   Tresa Garter, MD  HYDROmorphone (DILAUDID) 4 MG tablet Take 1 tablet (4 mg total) by mouth every 4 (four) hours as needed for severe pain. 11/06/15   Tresa Garter, MD  lisinopril (PRINIVIL,ZESTRIL) 5 MG tablet Take 1 tablet (5 mg total) by mouth daily. 08/04/15   [name], MD  morphine (MS CONTIN) 30 MG 12 hr tablet Take 1 tablet (30 mg total) by mouth every 12 (twelve) hours. 11/06/15   Tresa Garter, MD  rivaroxaban (XARELTO) 20 MG TABS tablet Take 20 mg by mouth daily.     Historical Provider, MD  torsemide (DEMADEX) 20 MG tablet Take 20 mg by mouth daily as needed. For increase in patient weight greater than 3 lbs. 07/27/15 07/26/16  Historical  Provider, MD  zolpidem (AMBIEN) 10 MG tablet Take 1 tablet (10 mg total) by mouth at bedtime as needed for sleep. 11/07/15 12/07/15  Tresa Garter, MD    Family History Family History  Problem Relation Age of Onset  . Sickle cell trait Mother   . Depression Mother   . Diabetes Mother   . Sickle cell trait Father   . Sickle cell trait Brother     Social History Social History  Substance Use Topics  . Smoking status: Former Smoker    Packs/day: 0.50    Years: 10.00    Types: Cigarettes  Quit date: 05/29/2011  . Smokeless tobacco: Never Used  . Alcohol use No     Allergies   Review of patient's allergies indicates no known allergies.   Review of Systems Review of Systems  Constitutional: Negative for chills, diaphoresis, fatigue and fever.  HENT: Negative for congestion, rhinorrhea and sneezing.   Eyes: Negative.   Respiratory: Positive for shortness of breath. Negative for cough and chest tightness.   Cardiovascular: Positive for chest pain (rib pain). Negative for leg swelling.  Gastrointestinal: Negative for abdominal pain, blood in stool, diarrhea, nausea and vomiting.  Genitourinary: Negative for difficulty urinating, flank pain, frequency and hematuria.  Musculoskeletal: Positive for back pain. Negative for arthralgias.  Skin: Negative for rash.  Neurological: Positive for speech difficulty and weakness. Negative for dizziness, numbness and headaches.     Physical Exam Updated Vital Signs BP 91/65   Pulse 94   Temp 98 F (36.7 C) (Axillary)   Resp 16   SpO2 96%   Physical Exam  Constitutional: He is oriented to person, place, and time. He appears well-developed and well-nourished.  HENT:  Head: Normocephalic and atraumatic.  Thrush to oropharynx which patient states is been there for a long time.  Eyes: Pupils are equal, round, and reactive to light.  Neck: Normal range of motion. Neck supple.  Cardiovascular: Normal rate and regular rhythm.     Murmur heard. Pulmonary/Chest: Effort normal and breath sounds normal. No respiratory distress. He has no wheezes. He has no rales. He exhibits no tenderness.  Abdominal: Soft. Bowel sounds are normal. There is no tenderness. There is no rebound and no guarding.  Musculoskeletal: Normal range of motion. He exhibits no edema.  Lymphadenopathy:    He has no cervical adenopathy.  Neurological: He is alert and oriented to person, place, and time.  Patient has some slight drooping of the corner of the left mouth. He has normal sensation bilaterally to the face. Extraocular eye movements are intact. Normal shoulder shrug. Normal tongue protrusion. Motor 4 out of 5 on the left upper extremity 5 out of 5 on the right upper extremity, 4 out of 5 in the bilateral lower extremities. Sensation is grossly intact to light touch in all extremities. Finger to nose is delayed bilaterally. No pronator drift.  Skin: Skin is warm and dry. No rash noted.  Psychiatric: He has a normal mood and affect.     ED Treatments / Results  Labs (all labs ordered are listed, but only abnormal results are displayed) Labs Reviewed  PROTIME-INR - Abnormal; Notable for the following:       Result Value   Prothrombin Time 16.9 (*)    All other components within normal limits  APTT - Abnormal; Notable for the following:    aPTT 70 (*)    All other components within normal limits  CBC - Abnormal; Notable for the following:    WBC 22.4 (*)    RBC 2.51 (*)    Hemoglobin 7.6 (*)    HCT 22.9 (*)    RDW 19.6 (*)    Platelets 439 (*)    All other components within normal limits  DIFFERENTIAL - Abnormal; Notable for the following:    Neutro Abs 19.3 (*)    Lymphs Abs 0.5 (*)    Monocytes Absolute 2.5 (*)    All other components within normal limits  COMPREHENSIVE METABOLIC PANEL - Abnormal; Notable for the following:    Chloride 113 (*)    Glucose, Bld 127 (*)  Creatinine, Ser 1.31 (*)    Total Bilirubin 3.8 (*)     Anion gap 4 (*)    All other components within normal limits  RAPID URINE DRUG SCREEN, HOSP PERFORMED - Abnormal; Notable for the following:    Opiates POSITIVE (*)    Tetrahydrocannabinol POSITIVE (*)    All other components within normal limits  URINALYSIS, ROUTINE W REFLEX MICROSCOPIC (NOT AT [facility]) - Abnormal; Notable for the following:    Color, Urine AMBER (*)    All other components within normal limits  HEPARIN LEVEL (UNFRACTIONATED) - Abnormal; Notable for the following:    Heparin Unfractionated <0.10 (*)    All other components within normal limits  I-STAT CHEM 8, ED - Abnormal; Notable for the following:    BUN 21 (*)    Creatinine, Ser 1.50 (*)    Glucose, Bld 127 (*)    Calcium, Ion 1.13 (*)    Hemoglobin 8.2 (*)    HCT 24.0 (*)    All other components within normal limits  I-STAT TROPOININ, ED - Abnormal; Notable for the following:    Troponin i, poc 0.35 (*)    All other components within normal limits  I-STAT ARTERIAL BLOOD GAS, ED - Abnormal; Notable for the following:    pO2, Arterial 54.0 (*)    All other components within normal limits  ETHANOL  HEPARIN LEVEL (UNFRACTIONATED)  I-STAT CG4 LACTIC ACID, ED    EKG  EKG Interpretation  Date/Time:  Tuesday November 10 2015 11:30:11 EDT Ventricular Rate:  103 PR Interval:    QRS Duration: 112 QT Interval:  391 QTC Calculation: 512 R Axis:   -109 Text Interpretation:  Sinus tachycardia Left atrial enlargement Probable right ventricular hypertrophy Inferior infarct, old Abnormal lateral Q waves Abnormal T, consider ischemia, anterior leads Prolonged QT interval Confirmed by Fatumata Kashani  MD, Ousmane Seeman ([id number]) on 11/10/2015 12:25:28 PM       Radiology Ct Head Wo Contrast  Result Date: 11/10/2015 CLINICAL DATA:  Left-sided weakness. EXAM: CT HEAD WITHOUT CONTRAST TECHNIQUE: Contiguous axial images were obtained from the base of the skull through the vertex without intravenous contrast. COMPARISON:  None. FINDINGS:  Brain: No evidence of acute infarction, hemorrhage, extra-axial collection, ventriculomegaly, or mass effect. Old left basal ganglia 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 Generalized cerebral atrophy. Periventricular white matter low attenuation likely secondary to microangiopathy. Vascular: No significant cerebral vascular atherosclerotic calcifications are noted. Skull: Negative for fracture or focal lesion. Sinuses/Orbits: Visualized portions of the orbits are unremarkable. Visualized portions of the paranasal sinuses and mastoid air cells are unremarkable. Other: None. IMPRESSION: 1. No acute intracranial pathology. Electronically Signed   By: Kathreen Devoid   On: 11/10/2015 11:16   Ct Angio Chest Pe W/cm &/or Wo Cm  Result Date: 11/10/2015 CLINICAL DATA:  Left flank pain and lethargy. Difficulty walking and facial droop with slurred speech. Hypoxia. Shortness of breath. Sickle cell anemia. EXAM: CT ANGIOGRAPHY CHEST WITH CONTRAST TECHNIQUE: Multidetector CT imaging of the chest was performed using the standard protocol during bolus administration of intravenous contrast. Multiplanar CT image reconstructions and MIPs were obtained to evaluate the vascular anatomy. CONTRAST:  65 cc Isovue 370 COMPARISON:  Multiple exams, including 11/10/2015 chest radiograph, and CT of 11/30/2014 FINDINGS: Cardiovascular: Filling defect compatible with acute pulmonary embolus in the right middle lobe, right lower lobe, and potentially left lower lobe signal told pulmonary vessels. Marked cardiomegaly with elongation of the heart, RV:LV ratio 1.5 (abnormally elevated). Small pericardial effusion. Enlarged main  pulmonary artery at 4.5 cm transverse. Left Port-A-Cath noted, tip a in the right atrium. Mediastinum/Nodes: Mass or confluent nodularity in the anterior mediastinum including a 1.7 by 4.5 cm anterior mediastinal component on image 50/4, confluent lymph nodes were also present previously but somewhat less  striking. Right hilar node 1.3 cm in short axis on image 57/4, previously 0.6 cm. A posterior right hilar node measures 1.4 cm in short axis on image 64/4, formerly 0.8 cm. Lungs/Pleura: Peripheral foraminal airspace opacity at the right lung apex is new possibly from pulmonary hemorrhage. There is bilateral primarily perihilar ground-glass opacities and patchy sub solid opacities in both lower lobes and to a lesser extent peripherally in the upper lobes. Likely a combination of edema and pulmonary hemorrhage along with scattered atelectasis. No significant pleural effusion. Upper Abdomen: Diffuse calcification of the spleen, similar to prior. 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 Endplate notching of the thoracic vertebra characteristic of sickle cell disease. Review of the MIP images confirms the above findings. IMPRESSION: 1. Acute pulmonary embolus is present in segmental branches of the right middle lobe and right lower lobe, and possibly the left lower lobe on today's exam. Positive for acute PE with CT evidence of right heart strain (RV/LV Ratio = 1.5) consistent with at least submassive (intermediate risk) PE. The presence of right heart strain has been associated with an increased risk of morbidity and mortality. Please activate Code PE by paging [phone]. 2. Stable marked cardiomegaly. 3. Small pericardial effusion. 4. Enlarged main pulmonary artery at 4.5 cm transverse. 5. Patchy ground-glass and peripheral opacities in both lungs likely a combination of mild pulmonary edema, pulmonary hemorrhage, and scattered atelectasis. 6. Osseous findings of sickle cell disease. 7. There are enlarged mediastinal and right hilar lymph nodes, increased from prior. These may merit follow up in the nonacute setting to ensure resolution and exclude the possibility of entities such as lymphoproliferative disease. Critical Value/emergent results were called by telephone  at the time of interpretation on 11/10/2015 at 1:06 pm to Dr. Threasa Beards Evy Lutterman , who verbally acknowledged these results. Electronically Signed   By: Van Clines M.D.   On: 11/10/2015 13:08   Dg Chest Port 1 View  Result Date: 11/10/2015 CLINICAL DATA:  History of sickle cell disease, now with shortness of breath and hypoxia. EXAM: PORTABLE CHEST 1 VIEW COMPARISON:  10/28/2015; 09/26/2015; 08/07/2015 FINDINGS: Similar findings of enlarged cardiac silhouette and mediastinal contours given reduced lung volumes. Pulmonary vasculature is indistinct with cephalization of flow. Grossly unchanged bilateral infrahilar heterogeneous opacities favored to represent atelectasis. Stable position of support apparatus. Trace pleural effusions are not excluded. No pneumothorax. 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 IMPRESSION: 1. Findings worrisome for pulmonary edema on this AP portable examination. Note, atypical infection could have a similar appearance. Further evaluation with a PA and lateral chest radiograph may be obtained as clinically indicated. 2. Similar findings of cardiomegaly. Electronically Signed   By: Sandi Mariscal M.D.   On: 11/10/2015 10:49    Procedures Procedures (including critical care time)  Medications Ordered in ED Medications  0.9 %  sodium chloride infusion ( Intravenous New Bag/Given 11/10/15 1134)  heparin ADULT infusion 100 units/mL (25000 units/247mL sodium chloride 0.45%) (1,100 Units/hr Intravenous New Bag/Given 11/10/15 1348)  fentaNYL (SUBLIMAZE) injection 50 mcg (not administered)  heparin bolus via infusion 2,500 Units (not administered)  sodium chloride 0.9 % bolus 500 mL (not administered)  sodium chloride 0.9 % bolus  500 mL (500 mLs Intravenous New Bag/Given 11/10/15 1132)  iopamidol (ISOVUE-370) 76 % injection (100 mLs  Contrast Given 11/10/15 1238)     Initial Impression / Assessment and Plan / ED Course  I  have reviewed the triage vital signs and the nursing notes.  Pertinent labs & imaging results that were available during my care of the patient were reviewed by me and considered in my medical decision making (see chart for details).  Clinical Course    Patient initially presented with concerns for possible stroke. Head CT is negative. MRI is pending. Neuro has seen the patient and has a lower suspicion for stroke for the patient still has aphasia present. There is no other focal neurologic findings present currently. He's been persistently hypoxic although he is maintaining oxygen saturations in the mid 90s on 4 L/m by nasal cannula. I obtained a CT scan which shows evidence of submassive pulmonary emboli with evidence of right heart strain. Right heart strain was noted on prior exams however per the radiologist it seems to be more today. He's mildly hypotensive with a blood pressure in the 90s. He was given IV fluids. I spoke with pulmonary critical care who did not feel that patient would be a candidate for thrombolysis or thrombectomy. I will consult the hospitalist for admission. Patient was started on heparin per pharmacy consult. They felt that since the patient has been taking Xarelto and his PTT is are ready elevated that a lower initial bolus should be given. He was started on a drip as well. He does have an elevated white count I don't see any other signs of infection. He's afebrile.  I spoke with Dyanne Carrel who will admit the pt with Dr. Marily Memos.  CRITICAL CARE Performed by: Nakiyah Beverley Total critical care time: 60 minutes Critical care time was exclusive of separately billable procedures and treating other patients. Critical care was necessary to treat or prevent imminent or life-threatening deterioration. Critical care was time spent personally by me on the following activities: development of treatment plan with patient and/or surrogate as well as nursing, discussions with  consultants, evaluation of patient's response to treatment, examination of patient, obtaining history from patient or surrogate, ordering and performing treatments and interventions, ordering and review of laboratory studies, ordering and review of radiographic studies, pulse oximetry and re-evaluation of patient's condition.   Final Clinical Impressions(s) / ED Diagnoses   Final diagnoses:  Aphasia  Other acute pulmonary embolism without acute cor pulmonale (HCC)    New Prescriptions New Prescriptions   No medications on file     Malvin Johns, MD 11/10/15 1514

## 2015-11-10 NOTE — Consult Note (Addendum)
Neurology Consultation Reason for Consult: Slurred speech, left facial droop Referring Physician: emergency room  CC: slurred speech  History is obtained from chart, patient ad family.  HPI: [name] is a 35 y.o. male . PMHx of sicle cell with crisis, ACS, DVT, pulmonary HTN, noncompliance, mood disorder, HTN, PE on chronic anticoagulation with Xarelto, afib, pain crisis on pain medication with multiple admissions, chronic resp failure on home O2. He was seen in sickle cell clinic and noticed that he had a staggering gait and left facial droop on presentation this morning, he was noted to be hypoxic with O2 in the 70/80s on EMS arrival improved with oxygen.  Last known normal was midnight at 5:55am he was noted to have trouble with speech. No alcohol or drug use.   Mother and brother at bedside. This morning at 6am he "was out of it" he couldn't  talk at all speech was "slurry" and confused. He was seen normal at midnight. Went to the clinic and they sent him here. Mother still feels like his speech is off. He was shuffling today. He has improved. No confusion at the momemnt. Patient says he woke up with the slurred speech. Patient said he has a headache yesterday. He is having a lot of pain today. Endorses compliance with Xarelto.    LKW: Midnight  tpa given?: no, out of the time window and mild symptoms  ROS: A 14 point ROS was performed and is negative except as noted in the HPI. He reports back pain and rib pain.  Unable to obtain due to altered mental status.   Past Medical History:  Diagnosis Date  . Acute chest syndrome ([place]) 06/18/2013  . Acute embolism and thrombosis of right internal jugular vein ([place])   . Alcohol consumption of one to four drinks per day   . Avascular necrosis (HCC)    Right Hip  . Blood transfusion   . Chronic anticoagulation   . Demand ischemia ([place]) 01/02/2014  . Former smoker   . Functional asplenia   . Hb-SS disease with crisis ([place])   . History  of Clostridium difficile infection   . History of pulmonary embolus (PE)   . Hypertension   . Hypokalemia   . Leukocytosis    Chronic  . Mood disorder ([place])   . Noncompliance with medication regimen   . Oxygen deficiency   . Pulmonary hypertension   . Second hand tobacco smoke exposure   . Sickle cell anemia (HCC)   . Sickle-cell crisis with associated acute chest syndrome ([place]) 05/13/2013  . Thrombocytosis (HCC)    Chronic  . Uses marijuana     Family History  Problem Relation Age of Onset  . Sickle cell trait Mother   . Depression Mother   . Diabetes Mother   . Sickle cell trait Father   . Sickle cell trait Brother     Social History:  reports that he quit smoking about 4 years ago. His smoking use included Cigarettes. He has a 5.00 pack-year smoking history. He has never used smokeless tobacco. He reports that he does not drink alcohol or use drugs.  Exam: Current vital signs: BP 121/81   Pulse 103   Temp 98 F (36.7 C) (Axillary)   Resp 20   SpO2 95%  Vital signs in last 24 hours: Temp:  [98 F (36.7 C)] 98 F (36.7 C) (10/10 1022) Pulse Rate:  [101-112] 103 (10/10 1130) Resp:  [17-20] 20 (10/10 1130) BP: (106-126)/(70-81) 121/81 (  10/10 1130) SpO2:  [92 %-99 %] 95 % (10/10 1130)   Physical Exam  Constitutional: Appears well-developed and well-nourished.  Psych: Affect appropriate to situation Eyes: No scleral injection HENT: No OP obstrucion Head: Normocephalic.  Cardiovascular: Tachycardic Respiratory: Effort normal and breath sounds normal to anterior ascultation GI: Soft.  No distension. There is no tenderness.  Skin: WDI  Neuro: Mental Status: Patient is awake, alert, oriented to person, place, month, year, and situation. Patient is able to give an abbreviated correct history Dysarthric speech No signs of aphasia or neglect Cranial Nerves: II: Visual Fields are full. Pupils are equal, round, and reactive to light.  III,IV, VI: EOMI without  ptosis or diploplia.  V: Facial sensation is symmetric to temperature VII: Facial movement is symmetric.  VIII: hearing is intact to voice X: Uvula elevates symmetrically XI: Shoulder shrug is symmetric. XII: tongue is midline without atrophy or fasciculations.  Motor: Tone is normal. Bulk is normal. 5/5 strength was present in all four extremities.   Sensory: Sensation is symmetric to light touch and temperature in the arms and legs. Deep Tendon Reflexes: 2+ and symmetric in the biceps and patellae. brisk Plantars: Toes are downgoing bilaterally.  Cerebellar: FNF and HKS are slowed but intact bilaterally  I have reviewed labs in epic and the results pertinent to this consultation are:  WBC 22.4 Hgb/Hct 7.6/22.9 Plts 439  Opiates and THC +  CT head:   FINDINGS: Brain: No evidence of acute infarction, hemorrhage, extra-axial collection, ventriculomegaly, or mass effect. Old left basal ganglia 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 Generalized cerebral atrophy. Periventricular white matter low attenuation likely secondary to microangiopathy.  Vascular: No significant cerebral vascular atherosclerotic calcifications are noted.  Skull: Negative for fracture or focal lesion.  Sinuses/Orbits: Visualized portions of the orbits are unremarkable. Visualized portions of the paranasal sinuses and mastoid air cells are unremarkable.  Other: None.  IMPRESSION: 1. No acute intracranial pathology.  MRI brain:   IMPRESSION: Background pattern of old infarctions throughout the cerebellum and old lacunar infarctions affecting the thalami basal ganglia. Multiple acute infarctions including areas of involvement in the left cerebellum, left dorsal pons midbrain junction, left occipital lobe, the left corpus callosum or cingulate gyrus, and a few small punctate foci in the left frontoparietal cortex. Findings are most consistent with embolic disease from the  heart or ascending aorta given the combination of anterior and posterior circulation infarctions.  CT angio chest:  IMPRESSION: 1. Acute pulmonary embolus is present in segmental branches of the right middle lobe and right lower lobe, and possibly the left lower lobe on today's exam. Positive for acute PE with CT evidence of right heart strain (RV/LV Ratio = 1.5) consistent with at least submassive (intermediate risk) PE. The presence of right heart strain has been associated with an increased risk of morbidity and mortality. Please activate Code PE by paging [phone]. 2. Stable marked cardiomegaly. 3. Small pericardial effusion. 4. Enlarged main pulmonary artery at 4.5 cm transverse. 5. Patchy ground-glass and peripheral opacities in both lungs likely a combination of mild pulmonary edema, pulmonary hemorrhage, and scattered atelectasis. 6. Osseous findings of sickle cell disease. 7. There are enlarged mediastinal and right hilar lymph nodes, increased from prior. These may merit follow up in the nonacute setting to ensure resolution and exclude the possibility of entities such as lymphoproliferative disease.   I have reviewed the images obtained:  Impression:  35 y.o. male . PMHx of sicle cell with crisis, ACS, DVT, pulmonary HTN,  noncompliance, mood disorder, HTN, PE on chronic anticoagulation with Xarelto, afib, pain crisis on pain medication with multiple admissions, chronic resp failure on home O2. Patient presented with slurred speech and possible left-sided droop and weakness however on exam is non focal except for speech deficits. He has some dysarthria and psychomotor slowing.    Recommendations: Patient with Multiple left-sided embolic infarcts, sickle cell crisis, acs, bilat pulmonary emboli, pneumonia.  Being transferred to [facility] for care.   Sarina Ill, MD [facility]   If 7pm- 7am, please page neurology on call as listed in [place].

## 2015-11-10 NOTE — ED Triage Notes (Addendum)
Pt from the [facility] via [facility] with c/o stroke like symptoms per staff with trouble walking and facial droop with slurred speech.  EMS reports stroke screen neg, no facial droop, equal grips.  On EMS arrival pt SpO2 73% RA.  Pt non compliant with wearing home O2.  EMS reports ST with unremarkable EKG, left flank pain, lethargic, and thrush with hx of the same.  Hx of hypoxia with PE and stroke.  A&Ox4.

## 2015-11-10 NOTE — ED Notes (Signed)
IV fluids paused so labs can be drawn from port.

## 2015-11-10 NOTE — ED Notes (Signed)
Attempted to call MRI.

## 2015-11-10 NOTE — ED Notes (Signed)
Patient transported to MRI 

## 2015-11-10 NOTE — ED Notes (Signed)
Dr. [name] at bedside

## 2015-11-10 NOTE — ED Notes (Signed)
Pt stated he is in pain. Pain scale 8 RN notofied

## 2015-11-10 NOTE — ED Notes (Signed)
Pt continues to request pain medications and pt again informed it is currently unsafe to give him pain medication rt his oxygen levels and his inability to stay alert. Pt very upset and requesting the MD. Dr. Tamera Punt informed.

## 2015-11-10 NOTE — ED Notes (Signed)
Family at bedside. Pt mother states she needs to talk to someone and see why nothing is being done for her son. Family member informed we are doing things to help the pt and have been running labs, doing scans and we currently have him on oxygen and fluids. The family member then states, "Well it doesn't look like you're doing anything to me". Family member informed he does look better since he walked in and has improved his oxygen saturation quite well. Family member then states, well he still looks tired and is slurring. Visitor informed that it takes a while to turn around patients and it won't happen all at once. Family member then states, "You need to be professional, I'm not saying you should have fixed him". This RN states I will get the doctor because I am unsure what else I can say. Family member remains argumentative and Dr. Tamera Punt informed.

## 2015-11-10 NOTE — ED Notes (Signed)
Dr. Belfi at bedside 

## 2015-11-11 DIAGNOSIS — I5022 Chronic systolic (congestive) heart failure: Secondary | ICD-10-CM | POA: Diagnosis present

## 2015-11-11 DIAGNOSIS — Q211 Atrial septal defect: Secondary | ICD-10-CM | POA: Diagnosis not present

## 2015-11-11 DIAGNOSIS — R0489 Hemorrhage from other sites in respiratory passages: Secondary | ICD-10-CM | POA: Diagnosis present

## 2015-11-11 DIAGNOSIS — E0590 Thyrotoxicosis, unspecified without thyrotoxic crisis or storm: Secondary | ICD-10-CM | POA: Diagnosis not present

## 2015-11-11 DIAGNOSIS — Z87891 Personal history of nicotine dependence: Secondary | ICD-10-CM | POA: Diagnosis not present

## 2015-11-11 DIAGNOSIS — Z7982 Long term (current) use of aspirin: Secondary | ICD-10-CM | POA: Diagnosis not present

## 2015-11-11 DIAGNOSIS — Z8673 Personal history of transient ischemic attack (TIA), and cerebral infarction without residual deficits: Secondary | ICD-10-CM | POA: Diagnosis not present

## 2015-11-11 DIAGNOSIS — I2729 Other secondary pulmonary hypertension: Secondary | ICD-10-CM | POA: Diagnosis present

## 2015-11-11 DIAGNOSIS — D5700 Hb-SS disease with crisis, unspecified: Secondary | ICD-10-CM | POA: Diagnosis present

## 2015-11-11 DIAGNOSIS — Z7901 Long term (current) use of anticoagulants: Secondary | ICD-10-CM | POA: Diagnosis not present

## 2015-11-11 DIAGNOSIS — I2699 Other pulmonary embolism without acute cor pulmonale: Secondary | ICD-10-CM | POA: Diagnosis present

## 2015-11-11 DIAGNOSIS — Z86711 Personal history of pulmonary embolism: Secondary | ICD-10-CM | POA: Diagnosis not present

## 2015-11-11 DIAGNOSIS — I69322 Dysarthria following cerebral infarction: Secondary | ICD-10-CM | POA: Diagnosis not present

## 2015-11-11 DIAGNOSIS — Z9981 Dependence on supplemental oxygen: Secondary | ICD-10-CM | POA: Diagnosis not present

## 2015-11-11 DIAGNOSIS — I6349 Cerebral infarction due to embolism of other cerebral artery: Secondary | ICD-10-CM | POA: Diagnosis present

## 2015-11-11 DIAGNOSIS — R918 Other nonspecific abnormal finding of lung field: Secondary | ICD-10-CM | POA: Diagnosis not present

## 2015-11-11 DIAGNOSIS — Z86718 Personal history of other venous thrombosis and embolism: Secondary | ICD-10-CM | POA: Diagnosis not present

## 2015-11-11 DIAGNOSIS — R1312 Dysphagia, oropharyngeal phase: Secondary | ICD-10-CM | POA: Diagnosis not present

## 2015-11-11 DIAGNOSIS — I517 Cardiomegaly: Secondary | ICD-10-CM | POA: Diagnosis not present

## 2015-11-11 DIAGNOSIS — R471 Dysarthria and anarthria: Secondary | ICD-10-CM | POA: Diagnosis present

## 2015-11-11 DIAGNOSIS — I639 Cerebral infarction, unspecified: Secondary | ICD-10-CM | POA: Diagnosis not present

## 2015-11-11 DIAGNOSIS — R591 Generalized enlarged lymph nodes: Secondary | ICD-10-CM | POA: Diagnosis present

## 2015-11-11 DIAGNOSIS — D571 Sickle-cell disease without crisis: Secondary | ICD-10-CM | POA: Diagnosis not present

## 2015-11-11 DIAGNOSIS — H53461 Homonymous bilateral field defects, right side: Secondary | ICD-10-CM | POA: Diagnosis present

## 2015-11-11 DIAGNOSIS — H539 Unspecified visual disturbance: Secondary | ICD-10-CM | POA: Diagnosis present

## 2015-11-11 DIAGNOSIS — R270 Ataxia, unspecified: Secondary | ICD-10-CM | POA: Diagnosis present

## 2015-11-11 DIAGNOSIS — I110 Hypertensive heart disease with heart failure: Secondary | ICD-10-CM | POA: Diagnosis present

## 2015-11-11 LAB — TYPE AND SCREEN
ABO/RH(D): O POS
Antibody Screen: NEGATIVE
Unit division: 0

## 2015-11-15 LAB — CULTURE, BLOOD (ROUTINE X 2)
CULTURE: NO GROWTH
CULTURE: NO GROWTH

## 2015-11-20 ENCOUNTER — Telehealth (HOSPITAL_COMMUNITY): Payer: Self-pay | Admitting: *Deleted

## 2015-11-20 NOTE — Telephone Encounter (Signed)
Patient called back at this time and advised to go to the emergency department per Dr. Doreene Burke. Patient voiced an understanding.

## 2015-11-20 NOTE — Telephone Encounter (Signed)
Patient called [facility] with a chief complaint of side pain and rates it an 8/10 on pain scale. Patient denies Fever, Chest Pain, N/V/D, abdominal pain and Priapism. Per patient he took his home medication Dilaudid and Hydroxyurea about an hour ago. Patient understands that provider will be notified and a return phone call will be made shortly.

## 2015-11-22 ENCOUNTER — Encounter (HOSPITAL_COMMUNITY): Payer: Self-pay

## 2015-11-22 ENCOUNTER — Emergency Department (HOSPITAL_COMMUNITY)
Admission: EM | Admit: 2015-11-22 | Discharge: 2015-11-22 | Disposition: A | Discharge status: Home / Self Care | Payer: Medicare Other | Attending: Emergency Medicine | Admitting: Emergency Medicine

## 2015-11-22 ENCOUNTER — Emergency Department (HOSPITAL_COMMUNITY): Payer: Medicare Other

## 2015-11-22 DIAGNOSIS — I10 Essential (primary) hypertension: Secondary | ICD-10-CM | POA: Diagnosis not present

## 2015-11-22 DIAGNOSIS — Z7901 Long term (current) use of anticoagulants: Secondary | ICD-10-CM | POA: Insufficient documentation

## 2015-11-22 DIAGNOSIS — D5700 Hb-SS disease with crisis, unspecified: Secondary | ICD-10-CM | POA: Diagnosis not present

## 2015-11-22 DIAGNOSIS — Z96641 Presence of right artificial hip joint: Secondary | ICD-10-CM | POA: Diagnosis not present

## 2015-11-22 DIAGNOSIS — Z87891 Personal history of nicotine dependence: Secondary | ICD-10-CM | POA: Insufficient documentation

## 2015-11-22 DIAGNOSIS — Z7982 Long term (current) use of aspirin: Secondary | ICD-10-CM | POA: Diagnosis not present

## 2015-11-22 DIAGNOSIS — Z8673 Personal history of transient ischemic attack (TIA), and cerebral infarction without residual deficits: Secondary | ICD-10-CM | POA: Diagnosis not present

## 2015-11-22 DIAGNOSIS — J811 Chronic pulmonary edema: Secondary | ICD-10-CM | POA: Diagnosis not present

## 2015-11-22 LAB — BASIC METABOLIC PANEL
Anion gap: 5 (ref 5–15)
BUN: 11 mg/dL (ref 6–20)
CHLORIDE: 109 mmol/L (ref 101–111)
CO2: 24 mmol/L (ref 22–32)
CREATININE: 0.79 mg/dL (ref 0.61–1.24)
Calcium: 8.8 mg/dL — ABNORMAL LOW (ref 8.9–10.3)
GFR calc Af Amer: >60 mL/min (ref >60)
GFR calc non Af Amer: >60 mL/min (ref >60)
GLUCOSE: 109 mg/dL — AB (ref 65–99)
Potassium: 3.5 mmol/L (ref 3.5–5.1)
SODIUM: 138 mmol/L (ref 135–145)

## 2015-11-22 LAB — URINALYSIS, ROUTINE W REFLEX MICROSCOPIC
Bilirubin Urine: NEGATIVE
Glucose, UA: NEGATIVE mg/dL
Hgb urine dipstick: NEGATIVE
Ketones, ur: NEGATIVE mg/dL
LEUKOCYTES UA: NEGATIVE
NITRITE: NEGATIVE
PH: 6.5 (ref 5.0–8.0)
Protein, ur: NEGATIVE mg/dL
SPECIFIC GRAVITY, URINE: 1.013 (ref 1.005–1.030)

## 2015-11-22 LAB — RETICULOCYTES
RBC.: 2.49 MIL/uL — ABNORMAL LOW (ref 4.22–5.81)
RETIC CT PCT: 6.3 % — AB (ref 0.4–3.1)
Retic Count, Absolute: 156.9 10*3/uL (ref 19.0–186.0)

## 2015-11-22 LAB — PROTIME-INR
INR: 1.7
PROTHROMBIN TIME: 20.2 s — AB (ref 11.4–15.2)

## 2015-11-22 LAB — TYPE AND SCREEN
ABO/RH(D): O POS
Antibody Screen: NEGATIVE

## 2015-11-22 LAB — I-STAT CG4 LACTIC ACID, ED
LACTIC ACID, VENOUS: 0.91 mmol/L (ref 0.5–1.9)
LACTIC ACID, VENOUS: 0.37 mmol/L — AB (ref 0.5–1.9)

## 2015-11-22 MED ORDER — ONDANSETRON HCL 4 MG/2ML IJ SOLN: 4.0000 mg | Freq: Once | INTRAMUSCULAR | Status: DC

## 2015-11-22 MED ORDER — DIPHENHYDRAMINE HCL 25 MG PO CAPS
25.0000 mg | ORAL_CAPSULE | ORAL | Status: DC | PRN
  Administered 2015-11-22: 25 mg via ORAL
  Filled 2015-11-22: qty 1

## 2015-11-22 MED ORDER — HEPARIN SOD (PORK) LOCK FLUSH 100 UNIT/ML IV SOLN
500.0000 [IU] | Freq: Once | INTRAVENOUS | Status: AC
  Administered 2015-11-22: 500 [IU]
  Filled 2015-11-22: qty 5

## 2015-11-22 MED ORDER — ONDANSETRON HCL 4 MG/2ML IJ SOLN
4.0000 mg | INTRAMUSCULAR | Status: DC | PRN
  Administered 2015-11-22: 4 mg via INTRAVENOUS
  Filled 2015-11-22: qty 2

## 2015-11-22 MED ORDER — SODIUM CHLORIDE 0.45 % IV SOLN
INTRAVENOUS | Status: DC
  Administered 2015-11-22: 08:00:00 via INTRAVENOUS

## 2015-11-22 MED ORDER — HYDROMORPHONE HCL 2 MG/ML IJ SOLN
0.0313 mg/kg | INTRAMUSCULAR | Status: AC
  Administered 2015-11-22: 2.5 mg via INTRAVENOUS
  Filled 2015-11-22: qty 2

## 2015-11-22 MED ORDER — HYDROMORPHONE HCL 2 MG/ML IJ SOLN
0.0250 mg/kg | INTRAMUSCULAR | Status: AC
  Administered 2015-11-22: 2 mg via INTRAVENOUS
  Filled 2015-11-22: qty 1

## 2015-11-22 MED ORDER — HYDROMORPHONE HCL 2 MG/ML IJ SOLN
0.0375 mg/kg | INTRAMUSCULAR | Status: AC
  Administered 2015-11-22: 3 mg via INTRAVENOUS
  Filled 2015-11-22: qty 2

## 2015-11-22 NOTE — ED Triage Notes (Signed)
Sickle cell crisis began around 0300am hurting all over

## 2015-11-22 NOTE — ED Notes (Signed)
Urinal has been given, patient has been encourage to give sample.

## 2015-11-22 NOTE — ED Provider Notes (Signed)
[facility] DEPT Provider Note   CSN: [id number] Arrival date & time: 11/22/15  [id number]     History   Chief Complaint Chief Complaint  Patient presents with  . Sickle Cell Pain Crisis    HPI [name] is a 35 y.o. male.  HPI Patient who is well-known to the emergency department comes in with sickle cell pain she awoke with this morning.  He denies any fever or chills.  Pain is all over.  Has recently been diagnosed with acute stroke but he has no new neurologic symptoms of slurred speech or numbness. Past Medical History:  Diagnosis Date  . Acute chest syndrome ([place]) 06/18/2013  . Acute embolism and thrombosis of right internal jugular vein ([place])   . Alcohol consumption of one to four drinks per day   . Avascular necrosis (HCC)    Right Hip  . Blood transfusion   . Chronic anticoagulation   . Demand ischemia ([place]) 01/02/2014  . Former smoker   . Functional asplenia   . Hb-SS disease with crisis ([place])   . History of Clostridium difficile infection   . History of pulmonary embolus (PE)   . Hypertension   . Hypokalemia   . Leukocytosis    Chronic  . Mood disorder ([place])   . Noncompliance with medication regimen   . Oxygen deficiency   . Pulmonary hypertension   . Second hand tobacco smoke exposure   . Sickle cell anemia (HCC)   . Sickle-cell crisis with associated acute chest syndrome ([place]) 05/13/2013  . Thrombocytosis (HCC)    Chronic  . Uses marijuana     Patient Active Problem List   Diagnosis Date Noted  . Slurred speech 11/10/2015  . Pulmonary emboli ([place]) 11/10/2015  . Aphasia   . Acute chest syndrome ([place])   . Ischemic stroke ([place])   . Sickle cell crisis ([place]) 10/27/2015  . Sickle cell pain crisis ([place]) 09/18/2015  . Sickle cell anemia with crisis ([place]) 09/04/2015  . Shortness of breath   . Pulmonary embolism without acute cor pulmonale ([place])   . Fever   . Sickle cell anemia with pain ([place]) 05/07/2015  . Acute kidney injury ([place])   . Hypoxia    . Anemia 01/31/2015  . Symptomatic anemia   . Hb-SS disease without crisis ([place]) 10/07/2014  . Acute respiratory failure ([place])   . PVC's (premature ventricular contractions) 09/10/2014  . Abnormal EKG 09/10/2014  . Chronic atrial fibrillation ([place]) 09/09/2014  . Cor pulmonale, chronic ([place]) 08/25/2014  . Sickle cell anemia ([place]) 06/25/2014  . Anemia of chronic disease 06/25/2014  . PAH (pulmonary artery hypertension) 03/18/2014  . Hb-SS disease with crisis ([place]) 01/22/2014  . Pulmonary embolus ([place]) 01/01/2014  . Paralytic strabismus, external ophthalmoplegia   . Chronic pain syndrome 12/12/2013  . Elevated troponin 11/26/2013  . Chronic anticoagulation 08/22/2013  . Essential hypertension 08/22/2013  . Pulmonary HTN 06/18/2013  . Functional asplenia   . Vitamin D deficiency 02/13/2013  . Hx of pulmonary embolus 06/29/2012  . Hemochromatosis 12/14/2011  . Avascular necrosis [facility])     Past Surgical History:  Procedure Laterality Date  . CHOLECYSTECTOMY     01/2008  . Excision of left periauricular cyst     10/2009  . Excision of right ear lobe cyst with primary closur     11/2007  . Porta cath placement    . Porta cath removal    . PORTACATH PLACEMENT  01/05/2012   Procedure: INSERTION PORT-A-CATH;  Surgeon:  Odis Hollingshead, MD;  Location: [facility];  Service: General;  Laterality: N/A;  ultrasound guiced port a cath insertion with fluoroscopy  . Right hip replacement     08/2006  . UMBILICAL HERNIA REPAIR     01/2008       Home Medications    Prior to Admission medications   Medication Sig Start Date End Date Taking? Authorizing Provider  ambrisentan (LETAIRIS) 5 MG tablet Take 5 mg by mouth at bedtime.   Yes Historical Provider, MD  aspirin 81 MG chewable tablet Chew 1 tablet (81 mg total) by mouth daily. 07/23/14  Yes [name], MD  Cholecalciferol (VITAMIN D) 2000 units tablet Take 1 tablet (2,000 Units total) by mouth daily. 02/23/15  Yes Dorena Dew, FNP  Deferasirox (JADENU) 360 MG TABS Take 1,080 mg by mouth daily.    Yes Historical Provider, MD  folic acid (FOLVITE) 1 MG tablet Take 1 tablet (1 mg total) by mouth every morning. 02/23/15  Yes Dorena Dew, FNP  gabapentin (NEURONTIN) 300 MG capsule Take 1 capsule (300 mg total) by mouth 3 (three) times daily. 06/01/15  Yes Tresa Garter, MD  HYDROmorphone (DILAUDID) 4 MG tablet Take 1 tablet (4 mg total) by mouth every 4 (four) hours as needed for severe pain. 11/06/15  Yes Tresa Garter, MD  hydroxyurea (HYDREA) 500 MG capsule Take 1,000 mg by mouth daily. May take with food to minimize GI side effects.   Yes Historical Provider, MD  lisinopril (PRINIVIL,ZESTRIL) 10 MG tablet  11/13/15  Yes Historical Provider, MD  lisinopril (PRINIVIL,ZESTRIL) 5 MG tablet Take 1 tablet (5 mg total) by mouth daily. 08/04/15  Yes [name], MD  morphine (MS CONTIN) 30 MG 12 hr tablet Take 1 tablet (30 mg total) by mouth every 12 (twelve) hours. 11/06/15  Yes Tresa Garter, MD  rivaroxaban (XARELTO) 20 MG TABS tablet Take 20 mg by mouth daily.    Yes Historical Provider, MD  torsemide (DEMADEX) 20 MG tablet Take 20 mg by mouth daily as needed. For increase in patient weight greater than 3 lbs. 07/27/15 07/26/16 Yes Historical Provider, MD  zolpidem (AMBIEN) 10 MG tablet Take 1 tablet (10 mg total) by mouth at bedtime as needed for sleep. 11/07/15 12/07/15 Yes Tresa Garter, MD    Family History Family History  Problem Relation Age of Onset  . Sickle cell trait Mother   . Depression Mother   . Diabetes Mother   . Sickle cell trait Father   . Sickle cell trait Brother     Social History Social History  Substance Use Topics  . Smoking status: Former Smoker    Packs/day: 0.50    Years: 10.00    Types: Cigarettes    Quit date: 05/29/2011  . Smokeless tobacco: Never Used  . Alcohol use No     Allergies   Review of patient's allergies indicates no known  allergies.   Review of Systems Review of Systems  All other systems reviewed and are negative.    Physical Exam Updated Vital Signs BP 101/63   Pulse 67   Temp 98.1 F (36.7 C) (Oral)   Resp 11   Ht 6' (1.829 m)   Wt 175 lb (79.4 kg)   SpO2 93%   BMI 23.73 kg/m   Physical Exam Physical Exam  Nursing note and vitals reviewed. Constitutional: He is oriented to person, place, and time. He appears well-developed and well-nourished. No distress.  HENT:  Head: Normocephalic and atraumatic.  Eyes: Pupils are equal, round, and reactive to light.  Neck: Normal range of motion.  Cardiovascular: Normal rate and intact distal pulses.   Pulmonary/Chest: No respiratory distress.  Abdominal: Normal appearance. He exhibits no distension.  Musculoskeletal: Normal range of motion.  Neurological: He is alert and oriented to person, place, and time. No cranial nerve deficit.  Skin: Skin is warm and dry. No rash noted.  Psychiatric: He has a normal mood and affect. His behavior is normal.    ED Treatments / Results  Labs (all labs ordered are listed, but only abnormal results are displayed) Labs Reviewed  RETICULOCYTES - Abnormal; Notable for the following:       Result Value   Retic Ct Pct 6.3 (*)    RBC. 2.49 (*)    All other components within normal limits  BASIC METABOLIC PANEL - Abnormal; Notable for the following:    Glucose, Bld 109 (*)    Calcium 8.8 (*)    All other components within normal limits  PROTIME-INR - Abnormal; Notable for the following:    Prothrombin Time 20.2 (*)    All other components within normal limits  I-STAT CG4 LACTIC ACID, ED - Abnormal; Notable for the following:    Lactic Acid, Venous 0.37 (*)    All other components within normal limits  URINALYSIS, ROUTINE W REFLEX MICROSCOPIC (NOT AT [facility])  I-STAT CG4 LACTIC ACID, ED  TYPE AND SCREEN    EKG  EKG Interpretation None       Radiology Dg Chest Port 1 View  Result Date:  11/22/2015 CLINICAL DATA:  Sickle-cell crisis, acute chest syndrome EXAM: PORTABLE CHEST 1 VIEW COMPARISON:  11/10/2015 FINDINGS: Marked cardiomegaly with mild vascular congestion and diffuse interstitial prominence. Basilar peripheral Kerley B-lines noted suspicious for mild basilar interstitial edema pattern. There is associated increased bibasilar atelectasis. No large effusion or pneumothorax. Trachea is midline. Left subclavian power port catheter tip at the SVC RA junction. IMPRESSION: Marked cardiomegaly with mild basilar interstitial edema pattern and increased basilar atelectasis. Electronically Signed   By: Jerilynn Mages.  Shick M.D.   On: 11/22/2015 08:40    Procedures Procedures (including critical care time)  Medications Ordered in ED Medications  diphenhydrAMINE (BENADRYL) capsule 25-50 mg (25 mg Oral Given 11/22/15 0811)  ondansetron (ZOFRAN) injection 4 mg (4 mg Intravenous Given 11/22/15 0811)  0.45 % sodium chloride infusion ( Intravenous New Bag/Given 11/22/15 0817)  heparin lock flush 100 unit/mL (not administered)  HYDROmorphone (DILAUDID) injection 2 mg (2 mg Intravenous Given 11/22/15 0812)  HYDROmorphone (DILAUDID) injection 2.5 mg (2.5 mg Intravenous Given 11/22/15 0921)  HYDROmorphone (DILAUDID) injection 3 mg (3 mg Intravenous Given 11/22/15 1025)     Initial Impression / Assessment and Plan / ED Course  I have reviewed the triage vital signs and the nursing notes.  Pertinent labs & imaging results that were available during my care of the patient were reviewed by me and considered in my medical decision making (see chart for details).  Clinical Course    After treatment in the ED the patient feels back to baseline and wants to go home.  Final Clinical Impressions(s) / ED Diagnoses   Final diagnoses:  Sickle cell pain crisis [facility])    New Prescriptions New Prescriptions   No medications on file     Leonard Schwartz, MD 11/22/15 1314

## 2015-11-22 NOTE — ED Notes (Signed)
MD at bedside. 

## 2015-11-22 NOTE — ED Notes (Signed)
Patient stated he would let us know when he can urinate for his urine sample.Marland KitchenMarland Kitchen

## 2015-11-22 NOTE — ED Notes (Signed)
PT stat at 86% room air. Place PT on 2L PT stat at 93%

## 2015-11-22 NOTE — ED Notes (Signed)
X-ray at bedside wiil get temp after x-ray.Marland Kitchen

## 2015-11-23 ENCOUNTER — Non-Acute Institutional Stay (EMERGENCY_DEPARTMENT_HOSPITAL)
Admission: AD | Admit: 2015-11-23 | Discharge: 2015-11-23 | Disposition: A | Discharge status: Home / Self Care | Payer: Medicare Other | Source: Ambulatory Visit | Attending: Internal Medicine | Admitting: Internal Medicine

## 2015-11-23 ENCOUNTER — Emergency Department (HOSPITAL_COMMUNITY): Payer: Medicare Other

## 2015-11-23 ENCOUNTER — Emergency Department (HOSPITAL_COMMUNITY)
Admission: EM | Admit: 2015-11-23 | Discharge: 2015-11-23 | Disposition: A | Discharge status: Home / Self Care | Payer: Medicare Other | Attending: Emergency Medicine | Admitting: Emergency Medicine

## 2015-11-23 ENCOUNTER — Encounter (HOSPITAL_COMMUNITY): Payer: Self-pay | Admitting: *Deleted

## 2015-11-23 ENCOUNTER — Telehealth (HOSPITAL_COMMUNITY): Payer: Self-pay | Admitting: *Deleted

## 2015-11-23 ENCOUNTER — Encounter (HOSPITAL_COMMUNITY): Payer: Self-pay | Admitting: Emergency Medicine

## 2015-11-23 DIAGNOSIS — J9621 Acute and chronic respiratory failure with hypoxia: Secondary | ICD-10-CM

## 2015-11-23 DIAGNOSIS — I10 Essential (primary) hypertension: Secondary | ICD-10-CM | POA: Diagnosis not present

## 2015-11-23 DIAGNOSIS — D5700 Hb-SS disease with crisis, unspecified: Secondary | ICD-10-CM | POA: Insufficient documentation

## 2015-11-23 DIAGNOSIS — Z79899 Other long term (current) drug therapy: Secondary | ICD-10-CM | POA: Diagnosis not present

## 2015-11-23 DIAGNOSIS — F1290 Cannabis use, unspecified, uncomplicated: Secondary | ICD-10-CM | POA: Diagnosis not present

## 2015-11-23 DIAGNOSIS — Z87891 Personal history of nicotine dependence: Secondary | ICD-10-CM | POA: Insufficient documentation

## 2015-11-23 DIAGNOSIS — M549 Dorsalgia, unspecified: Secondary | ICD-10-CM | POA: Diagnosis present

## 2015-11-23 DIAGNOSIS — Z7982 Long term (current) use of aspirin: Secondary | ICD-10-CM | POA: Diagnosis not present

## 2015-11-23 DIAGNOSIS — I517 Cardiomegaly: Secondary | ICD-10-CM | POA: Diagnosis not present

## 2015-11-23 LAB — CBC WITH DIFFERENTIAL/PLATELET
BASOS PCT: 1 %
Basophils Absolute: 0.1 10*3/uL (ref 0.0–0.1)
EOS ABS: 0.4 10*3/uL (ref 0.0–0.7)
Eosinophils Relative: 3 %
HEMATOCRIT: 21.6 % — AB (ref 39.0–52.0)
HEMOGLOBIN: 6.9 g/dL — AB (ref 13.0–17.0)
LYMPHS ABS: 1.8 10*3/uL (ref 0.7–4.0)
Lymphocytes Relative: 12 %
MCH: 29.4 pg (ref 26.0–34.0)
MCHC: 31.9 g/dL (ref 30.0–36.0)
MCV: 91.9 fL (ref 78.0–100.0)
Monocytes Absolute: 1.5 10*3/uL — ABNORMAL HIGH (ref 0.1–1.0)
Monocytes Relative: 10 %
NEUTROS ABS: 11.2 10*3/uL — AB (ref 1.7–7.7)
NEUTROS PCT: 75 %
Platelets: 610 10*3/uL — ABNORMAL HIGH (ref 150–400)
RBC: 2.35 MIL/uL — AB (ref 4.22–5.81)
RDW: 19.6 % — ABNORMAL HIGH (ref 11.5–15.5)
WBC: 14.9 10*3/uL — AB (ref 4.0–10.5)

## 2015-11-23 LAB — COMPREHENSIVE METABOLIC PANEL
ALBUMIN: 4 g/dL (ref 3.5–5.0)
ALT: 17 U/L (ref 17–63)
ANION GAP: 5 (ref 5–15)
AST: 26 U/L (ref 15–41)
Alkaline Phosphatase: 73 U/L (ref 38–126)
BUN: 21 mg/dL — ABNORMAL HIGH (ref 6–20)
CHLORIDE: 110 mmol/L (ref 101–111)
CO2: 24 mmol/L (ref 22–32)
Calcium: 8.6 mg/dL — ABNORMAL LOW (ref 8.9–10.3)
Creatinine, Ser: 1.33 mg/dL — ABNORMAL HIGH (ref 0.61–1.24)
GFR calc Af Amer: >60 mL/min (ref >60)
GFR calc non Af Amer: >60 mL/min (ref >60)
GLUCOSE: 128 mg/dL — AB (ref 65–99)
POTASSIUM: 4 mmol/L (ref 3.5–5.1)
SODIUM: 139 mmol/L (ref 135–145)
Total Bilirubin: 3.5 mg/dL — ABNORMAL HIGH (ref 0.3–1.2)
Total Protein: 7.1 g/dL (ref 6.5–8.1)

## 2015-11-23 LAB — RETICULOCYTES
RBC.: 2.35 MIL/uL — AB (ref 4.22–5.81)
RETIC CT PCT: 6.2 % — AB (ref 0.4–3.1)
Retic Count, Absolute: 145.7 10*3/uL (ref 19.0–186.0)

## 2015-11-23 MED ORDER — SODIUM CHLORIDE 0.9 % IV SOLN: 25.0000 mg | INTRAVENOUS | Status: DC | PRN

## 2015-11-23 MED ORDER — KETOROLAC TROMETHAMINE 30 MG/ML IJ SOLN
30.0000 mg | INTRAMUSCULAR | Status: AC
  Administered 2015-11-23: 30 mg via INTRAVENOUS
  Filled 2015-11-23: qty 1

## 2015-11-23 MED ORDER — HYDROMORPHONE HCL 2 MG/ML IJ SOLN: 1.0000 mg | INTRAMUSCULAR | Status: DC | PRN

## 2015-11-23 MED ORDER — SENNOSIDES-DOCUSATE SODIUM 8.6-50 MG PO TABS: 1.0000 | ORAL_TABLET | Freq: Two times a day (BID) | ORAL | Status: DC

## 2015-11-23 MED ORDER — DIPHENHYDRAMINE HCL 25 MG PO CAPS: 25.0000 mg | ORAL_CAPSULE | ORAL | Status: DC | PRN

## 2015-11-23 MED ORDER — HYDROMORPHONE 1 MG/ML IV SOLN
INTRAVENOUS | Status: DC
  Administered 2015-11-23: 11.9 mg via INTRAVENOUS
  Administered 2015-11-23: 12:00:00 via INTRAVENOUS
  Filled 2015-11-23: qty 25

## 2015-11-23 MED ORDER — ONDANSETRON HCL 4 MG/2ML IJ SOLN
4.0000 mg | INTRAMUSCULAR | Status: DC | PRN
  Administered 2015-11-23: 4 mg via INTRAVENOUS
  Filled 2015-11-23: qty 2

## 2015-11-23 MED ORDER — HYDROMORPHONE HCL 2 MG/ML IJ SOLN: 0.0250 mg/kg | INTRAMUSCULAR | Status: AC

## 2015-11-23 MED ORDER — KETOROLAC TROMETHAMINE 30 MG/ML IJ SOLN: 30.0000 mg | Freq: Four times a day (QID) | INTRAMUSCULAR | Status: DC

## 2015-11-23 MED ORDER — NALOXONE HCL 0.4 MG/ML IJ SOLN: 0.4000 mg | INTRAMUSCULAR | Status: DC | PRN

## 2015-11-23 MED ORDER — HEPARIN SOD (PORK) LOCK FLUSH 100 UNIT/ML IV SOLN
500.0000 [IU] | INTRAVENOUS | Status: AC | PRN
  Administered 2015-11-23: 500 [IU]
  Filled 2015-11-23: qty 5

## 2015-11-23 MED ORDER — DEXTROSE-NACL 5-0.45 % IV SOLN
INTRAVENOUS | Status: DC
  Administered 2015-11-23: 10:00:00 via INTRAVENOUS

## 2015-11-23 MED ORDER — SODIUM CHLORIDE 0.9% FLUSH
10.0000 mL | INTRAVENOUS | Status: AC | PRN
  Administered 2015-11-23: 10 mL

## 2015-11-23 MED ORDER — POLYETHYLENE GLYCOL 3350 17 G PO PACK: 17.0000 g | PACK | Freq: Every day | ORAL | Status: DC | PRN

## 2015-11-23 MED ORDER — ONDANSETRON HCL 4 MG/2ML IJ SOLN: 4.0000 mg | Freq: Four times a day (QID) | INTRAMUSCULAR | Status: DC | PRN

## 2015-11-23 MED ORDER — SODIUM CHLORIDE 0.9% FLUSH: 9.0000 mL | INTRAVENOUS | Status: DC | PRN

## 2015-11-23 MED ORDER — DEXTROSE-NACL 5-0.45 % IV SOLN: INTRAVENOUS | Status: DC

## 2015-11-23 MED ORDER — HYDROMORPHONE HCL 2 MG/ML IJ SOLN
0.0250 mg/kg | INTRAMUSCULAR | Status: AC
  Administered 2015-11-23: 2 mg via INTRAVENOUS
  Filled 2015-11-23: qty 1

## 2015-11-23 MED ORDER — DIPHENHYDRAMINE HCL 25 MG PO CAPS
25.0000 mg | ORAL_CAPSULE | ORAL | Status: DC | PRN
  Administered 2015-11-23: 25 mg via ORAL
  Filled 2015-11-23: qty 1

## 2015-11-23 NOTE — ED Provider Notes (Signed)
[facility] DEPT Provider Note   CSN: [id number] Arrival date & time: 11/23/15  [id number]     History   Chief Complaint Chief Complaint  Patient presents with  . Sickle Cell Pain Crisis  . Back Pain  . Leg Swelling    HPI [name] is a 35 y.o. male.   Sickle Cell Pain Crisis  Location:  Back, hip and lower extremity Severity:  Severe Onset quality:  Gradual Duration:  4 days Similar to previous crisis episodes: yes   Timing:  Constant Progression:  Worsening Chronicity:  Recurrent Sickle cell genotype:  SS Date of last transfusion:  Early this month History of pulmonary emboli: yes   Context: not alcohol consumption, not change in medication, not dehydration, not non-compliance and not low humidity   Relieved by:  Nothing Worsened by:  Nothing Ineffective treatments:  Prescription drugs Associated symptoms: no chest pain, no congestion, no cough, no fatigue, no fever, no nausea, no shortness of breath, no sore throat, no vomiting and no wheezing   Risk factors: hx of stroke and prior acute chest   Risk factors: no smoking   Back Pain   Pertinent negatives include no chest pain, no fever, no abdominal pain and no dysuria.   Patient with extensive history including pulmonary embolisms currently owns a result so, recent stroke due to PFO. He was transferred to [facility] from previous admission for plasmapheresis.  A baseline patient uses 2 L nasal cannula at home which was decreased to when necessary oxygen for shortness of breath. Patient has not needed oxygen due to the lack of shortness of breath. Past Medical History:  Diagnosis Date  . Acute chest syndrome ([place]) 06/18/2013  . Acute embolism and thrombosis of right internal jugular vein ([place])   . Alcohol consumption of one to four drinks per day   . Avascular necrosis (HCC)    Right Hip  . Blood transfusion   . Chronic anticoagulation   . Demand ischemia ([place]) 01/02/2014  . Former smoker   . Functional  asplenia   . Hb-SS disease with crisis ([place])   . History of Clostridium difficile infection   . History of pulmonary embolus (PE)   . Hypertension   . Hypokalemia   . Leukocytosis    Chronic  . Mood disorder ([place])   . Noncompliance with medication regimen   . Oxygen deficiency   . Pulmonary hypertension   . Second hand tobacco smoke exposure   . Sickle cell anemia (HCC)   . Sickle-cell crisis with associated acute chest syndrome ([place]) 05/13/2013  . Thrombocytosis (HCC)    Chronic  . Uses marijuana     Patient Active Problem List   Diagnosis Date Noted  . Slurred speech 11/10/2015  . Pulmonary emboli ([place]) 11/10/2015  . Aphasia   . Acute chest syndrome ([place])   . Ischemic stroke ([place])   . Sickle cell crisis ([place]) 10/27/2015  . Sickle cell pain crisis ([place]) 09/18/2015  . Sickle cell anemia with crisis ([place]) 09/04/2015  . Shortness of breath   . Pulmonary embolism without acute cor pulmonale ([place])   . Fever   . Sickle cell anemia with pain ([place]) 05/07/2015  . Acute kidney injury ([place])   . Hypoxia   . Anemia 01/31/2015  . Symptomatic anemia   . Hb-SS disease without crisis ([place]) 10/07/2014  . Acute respiratory failure ([place])   . PVC's (premature ventricular contractions) 09/10/2014  . Abnormal EKG 09/10/2014  . Chronic atrial fibrillation ([place]) 09/09/2014  .  Cor pulmonale, chronic ([place]) 08/25/2014  . Sickle cell anemia ([place]) 06/25/2014  . Anemia of chronic disease 06/25/2014  . PAH (pulmonary artery hypertension) 03/18/2014  . Hb-SS disease with crisis ([place]) 01/22/2014  . Pulmonary embolus ([place]) 01/01/2014  . Paralytic strabismus, external ophthalmoplegia   . Chronic pain syndrome 12/12/2013  . Elevated troponin 11/26/2013  . Chronic anticoagulation 08/22/2013  . Essential hypertension 08/22/2013  . Pulmonary HTN 06/18/2013  . Functional asplenia   . Vitamin D deficiency 02/13/2013  . Hx of pulmonary embolus 06/29/2012  . Hemochromatosis 12/14/2011  . Avascular  necrosis [facility])     Past Surgical History:  Procedure Laterality Date  . CHOLECYSTECTOMY     01/2008  . Excision of left periauricular cyst     10/2009  . Excision of right ear lobe cyst with primary closur     11/2007  . Porta cath placement    . Porta cath removal    . PORTACATH PLACEMENT  01/05/2012   Procedure: INSERTION PORT-A-CATH;  Surgeon: Odis Hollingshead, MD;  Location: [facility];  Service: General;  Laterality: N/A;  ultrasound guiced port a cath insertion with fluoroscopy  . Right hip replacement     08/2006  . UMBILICAL HERNIA REPAIR     01/2008       Home Medications    Prior to Admission medications   Medication Sig Start Date End Date Taking? Authorizing Provider  ambrisentan (LETAIRIS) 5 MG tablet Take 5 mg by mouth at bedtime.   Yes Historical Provider, MD  aspirin 81 MG chewable tablet Chew 1 tablet (81 mg total) by mouth daily. 07/23/14  Yes [name], MD  Cholecalciferol (VITAMIN D) 2000 units tablet Take 1 tablet (2,000 Units total) by mouth daily. 02/23/15  Yes Dorena Dew, FNP  Deferasirox (JADENU) 360 MG TABS Take 1,080 mg by mouth daily.    Yes Historical Provider, MD  folic acid (FOLVITE) 1 MG tablet Take 1 tablet (1 mg total) by mouth every morning. 02/23/15  Yes Dorena Dew, FNP  gabapentin (NEURONTIN) 300 MG capsule Take 1 capsule (300 mg total) by mouth 3 (three) times daily. 06/01/15  Yes Tresa Garter, MD  HYDROmorphone (DILAUDID) 4 MG tablet Take 1 tablet (4 mg total) by mouth every 4 (four) hours as needed for severe pain. 11/06/15  Yes Tresa Garter, MD  hydroxyurea (HYDREA) 500 MG capsule Take 1,000 mg by mouth daily. May take with food to minimize GI side effects.   Yes Historical Provider, MD  lisinopril (PRINIVIL,ZESTRIL) 10 MG tablet  11/13/15  Yes Historical Provider, MD  lisinopril (PRINIVIL,ZESTRIL) 5 MG tablet Take 1 tablet (5 mg total) by mouth daily. 08/04/15  Yes [name], MD  morphine (MS CONTIN) 30 MG  12 hr tablet Take 1 tablet (30 mg total) by mouth every 12 (twelve) hours. 11/06/15  Yes Tresa Garter, MD  rivaroxaban (XARELTO) 20 MG TABS tablet Take 20 mg by mouth daily.    Yes Historical Provider, MD  torsemide (DEMADEX) 20 MG tablet Take 20 mg by mouth daily as needed. For increase in patient weight greater than 3 lbs. 07/27/15 07/26/16 Yes Historical Provider, MD  zolpidem (AMBIEN) 10 MG tablet Take 1 tablet (10 mg total) by mouth at bedtime as needed for sleep. 11/07/15 12/07/15 Yes Tresa Garter, MD    Family History Family History  Problem Relation Age of Onset  . Sickle cell trait Mother   . Depression Mother   .  Diabetes Mother   . Sickle cell trait Father   . Sickle cell trait Brother     Social History Social History  Substance Use Topics  . Smoking status: Former Smoker    Packs/day: 0.50    Years: 10.00    Types: Cigarettes    Quit date: 05/29/2011  . Smokeless tobacco: Never Used  . Alcohol use No     Allergies   Review of patient's allergies indicates no known allergies.   Review of Systems Review of Systems  Constitutional: Negative for chills, fatigue and fever.  HENT: Negative for congestion, ear pain and sore throat.   Eyes: Negative for pain and visual disturbance.  Respiratory: Negative for cough, shortness of breath and wheezing.   Cardiovascular: Negative for chest pain and palpitations.  Gastrointestinal: Negative for abdominal pain, nausea and vomiting.  Genitourinary: Negative for dysuria and hematuria.  Musculoskeletal: Positive for back pain. Negative for arthralgias.  Skin: Negative for color change and rash.  Neurological: Negative for seizures and syncope.  All other systems reviewed and are negative.    Physical Exam Updated Vital Signs BP 102/71   Pulse 77   Temp 98.6 F (37 C) (Oral)   Resp 14   Ht 6' (1.829 m)   Wt 170 lb (77.1 kg)   SpO2 92%   BMI 23.06 kg/m   Physical Exam  Constitutional: He is oriented to  person, place, and time. He appears well-developed and well-nourished. No distress.  HENT:  Head: Normocephalic and atraumatic.  Nose: Nose normal.  Eyes: Conjunctivae and EOM are normal. Pupils are equal, round, and reactive to light. Right eye exhibits no discharge. Left eye exhibits no discharge. No scleral icterus.  Neck: Normal range of motion. Neck supple.  Cardiovascular: Normal rate and regular rhythm.  Exam reveals no gallop and no friction rub.   Murmur heard.  Systolic (pulmonic) murmur is present with a grade of 3/6  Pulmonary/Chest: Effort normal and breath sounds normal. No stridor. No respiratory distress. He has no rales.  Abdominal: Soft. He exhibits no distension. There is no tenderness.  Musculoskeletal: He exhibits no edema.       Right hip: He exhibits tenderness.       Left hip: He exhibits tenderness.       Lumbar back: He exhibits tenderness.       Back:       Right upper leg: He exhibits tenderness.       Left upper leg: He exhibits tenderness.  Neurological: He is alert and oriented to person, place, and time.  Skin: Skin is warm and dry. No rash noted. He is not diaphoretic. No erythema.  Psychiatric: He has a normal mood and affect.  Vitals reviewed.    ED Treatments / Results  Labs (all labs ordered are listed, but only abnormal results are displayed) Labs Reviewed  CBC WITH DIFFERENTIAL/PLATELET - Abnormal; Notable for the following:       Result Value   WBC 14.9 (*)    RBC 2.35 (*)    Hemoglobin 6.9 (*)    HCT 21.6 (*)    RDW 19.6 (*)    Platelets 610 (*)    Neutro Abs 11.2 (*)    Monocytes Absolute 1.5 (*)    All other components within normal limits  RETICULOCYTES - Abnormal; Notable for the following:    Retic Ct Pct 6.2 (*)    RBC. 2.35 (*)    All other components within normal limits  COMPREHENSIVE  METABOLIC PANEL - Abnormal; Notable for the following:    Glucose, Bld 128 (*)    BUN 21 (*)    Creatinine, Ser 1.33 (*)    Calcium 8.6  (*)    Total Bilirubin 3.5 (*)    All other components within normal limits    EKG  EKG Interpretation None       Radiology Dg Chest 2 View  Result Date: 11/23/2015 CLINICAL DATA:  Bilateral side pain for 1 week with hypoxia. EXAM: CHEST  2 VIEW COMPARISON:  11/22/2015 FINDINGS: The lungs are clear wiithout focal pneumonia, edema, pneumothorax or pleural effusion. The cardio pericardial silhouette is enlarged. There is pulmonary vascular congestion without overt pulmonary edema. Nodular areas in the peripheral aspect of the right mid lung were seen on CT scan from 2 weeks ago. Left Port-A-Cath tip projects at the SVC/RA junction. Telemetry leads overlie the chest. IMPRESSION: Cardiomegaly with pulmonary vascular congestion. Nodular density right mid lung better seen on recent CT scan. Electronically Signed   By: Misty Stanley M.D.   On: 11/23/2015 10:34   Dg Chest Port 1 View  Result Date: 11/22/2015 CLINICAL DATA:  Sickle-cell crisis, acute chest syndrome EXAM: PORTABLE CHEST 1 VIEW COMPARISON:  11/10/2015 FINDINGS: Marked cardiomegaly with mild vascular congestion and diffuse interstitial prominence. Basilar peripheral Kerley B-lines noted suspicious for mild basilar interstitial edema pattern. There is associated increased bibasilar atelectasis. No large effusion or pneumothorax. Trachea is midline. Left subclavian power port catheter tip at the SVC RA junction. IMPRESSION: Marked cardiomegaly with mild basilar interstitial edema pattern and increased basilar atelectasis. Electronically Signed   By: Jerilynn Mages.  Shick M.D.   On: 11/22/2015 08:40    Procedures Procedures (including critical care time)  Medications Ordered in ED Medications  diphenhydrAMINE (BENADRYL) capsule 25-50 mg (25 mg Oral Given 11/23/15 1011)  ondansetron (ZOFRAN) injection 4 mg (4 mg Intravenous Given 11/23/15 0957)  dextrose 5 %-0.45 % sodium chloride infusion ( Intravenous New Bag/Given 11/23/15 0953)  ketorolac  (TORADOL) 30 MG/ML injection 30 mg (30 mg Intravenous Given 11/23/15 1001)  HYDROmorphone (DILAUDID) injection 2 mg (2 mg Intravenous Given 11/23/15 1004)    Or  HYDROmorphone (DILAUDID) injection 2 mg ( Subcutaneous See Alternative 11/23/15 1004)     Initial Impression / Assessment and Plan / ED Course  I have reviewed the triage vital signs and the nursing notes.  Pertinent labs & imaging results that were available during my care of the patient were reviewed by me and considered in my medical decision making (see chart for details).  Clinical Course    Hypoxia on arrival to 89% on room air which improved with 2 L nasal cannula. Screening labs at baseline for patient. Chest x-ray with no evidence of acute chest or pneumonia. Discussed case with Dr. Zigmund Daniel and Dr. Doreene Burke who both felt the patient was appropriate for sickle cell clinic. Patient given initial dose of pain medicine. Discharged to sickle cell clinic.  Final Clinical Impressions(s) / ED Diagnoses   Final diagnoses:  Sickle cell crisis ([place])   Disposition: Discharge  Condition: stable  I have discussed the results, Dx and Tx plan with the patient who expressed understanding and agree(s) with the plan. Discharge instructions discussed at great length. The patient was given strict return precautions who verbalized understanding of the instructions. No further questions at time of discharge.    Current Discharge Medication List      Follow Up: Tresa Garter, MD [place]  [place] [address] [phone]  Schedule an appointment as soon as possible for a visit  As needed      Fatima Blank, MD 11/23/15 1042

## 2015-11-23 NOTE — Telephone Encounter (Signed)
Called [facility] for complaints of 9/10 pain in bilateral side, bilateral back and bilateral rib pain. Denies fever, chest pain, N/V, Diarrhea, abdominal pain or priapism. Speech slurred. Took Dilaudid and MS Contin at 6 am without relief and is seeking treatment.

## 2015-11-23 NOTE — ED Notes (Signed)
hgb 6.9

## 2015-11-23 NOTE — ED Notes (Signed)
Patient's O2 is 88%. Applied [place] at 2 L. Will continue to monitor.

## 2015-11-23 NOTE — Discharge Instructions (Signed)

## 2015-11-23 NOTE — Discharge Summary (Signed)
Physician Discharge Summary  JAE PETCH [id number] DOB: 06/12/1980 DOA: 11/23/2015  PCP: [name], MD  Admit date: 11/23/2015  Discharge date: 11/23/2015  Time spent: 30 minutes  Discharge Diagnoses:  Active Problems:   Sickle cell anemia with pain [facility])   Discharge Condition: Stable  Diet recommendation: Regular  Filed Weights   11/23/15 1108  Weight: 170 lb (77.1 kg)    History of present illness:  [name] is a 35 y.o. 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 Denies fever, chest pain, N/V, Diarrhea, abdominal pain or priapism. He took Dilaudid and MS Contin at 6 am without relief. He was treated in the ED as per sickle cell crisis protocol but pain continued, and patient was transferred to the [facility] for extended observation and pain management. Patient was also in the ED yesterday for the same complaint. He has no new symptom of slurred speech, no new weakness or numbness. In the ED, his oxygen level was down to 88% on RA but improved with oxygen by [place]. He denies SOB. CXR showed cardiomegaly with Pulm vascular congestion.  Hospital Course:  EMMERIC AVETISYAN was admitted to the day hospital with sickle cell painful crisis. Patient was treated with weight based IV Dilaudid PCA, IV Toradol as well as IV fluids. [place] showed significant improvement symptomatically, pain improved from 9 to 4/10 at the time of discharge. Patient was discharged home is a hemodynamically stable condition. [place] will follow-up at the clinic as previously scheduled, continue with home medications as per  prior to admission. Patient's oxygen level was maintained at above 90% at discharge  Discharge Instructions We discussed the need for good hydration, monitoring of hydration status, avoidance of heat, cold, stress, and infection triggers. We discussed the need to be compliant with taking Hydrea. Erling was reminded of the need to seek medical attention of any symptoms of bleeding, anemia, or infection occurs.  Discharge Exam: Vitals:   11/23/15 1537 11/23/15 1623  BP: 102/60   Pulse: 80   Resp: 15 14  Temp:      General appearance: alert, cooperative and no distress Eyes: conjunctivae/corneas clear. PERRL, EOM's intact. Fundi benign. Neck: no adenopathy, no carotid bruit, no JVD, supple, symmetrical, trachea midline and thyroid not enlarged, symmetric, no tenderness/mass/nodules Back: symmetric, no curvature. ROM normal. No CVA tenderness. Resp: clear to auscultation bilaterally Chest wall: no tenderness Cardio: regular rate and rhythm, S1, S2 normal, no murmur, click, rub or gallop GI: soft, non-tender; bowel sounds normal; no masses, no organomegaly Extremities: extremities normal, atraumatic, no cyanosis or edema Pulses: 2+ and symmetric Skin: Skin color, texture, turgor normal. No rashes or lesions Neurologic: Grossly normal   Current Discharge Medication List    CONTINUE these medications which have NOT CHANGED   Details  ambrisentan (LETAIRIS) 5 MG tablet Take 5 mg by mouth at bedtime.    aspirin 81 MG chewable tablet Chew 1 tablet (81 mg total) by mouth daily. Qty: 30 tablet, Refills: 11    Cholecalciferol (VITAMIN D) 2000 units tablet Take 1 tablet (2,000 Units total) by mouth daily. Qty: 90 tablet, Refills: 3   Associated Diagnoses: Vitamin D deficiency  Deferasirox (JADENU) 360 MG TABS Take 1,080 mg by mouth daily.     folic acid (FOLVITE) 1 MG tablet Take 1 tablet (1 mg total) by mouth every morning. Qty: 30 tablet, Refills: 11   Associated Diagnoses: Hb-SS  disease without crisis ([facility])    gabapentin (NEURONTIN) 300 MG capsule Take 1 capsule (300 mg total) by mouth 3 (three) times daily. Qty: 270 capsule, Refills: 3    HYDROmorphone (DILAUDID) 4 MG tablet Take 1 tablet (4 mg total) by mouth every 4 (four) hours as needed for severe pain. Qty: 90 tablet, Refills: 0    hydroxyurea (HYDREA) 500 MG capsule Take 1,000 mg by mouth daily. May take with food to minimize GI side effects.    !! lisinopril (PRINIVIL,ZESTRIL) 10 MG tablet Refills: 1    !! lisinopril (PRINIVIL,ZESTRIL) 5 MG tablet Take 1 tablet (5 mg total) by mouth daily. Qty: 30 tablet, Refills: 1    morphine (MS CONTIN) 30 MG 12 hr tablet Take 1 tablet (30 mg total) by mouth every 12 (twelve) hours. Qty: 60 tablet, Refills: 0    rivaroxaban (XARELTO) 20 MG TABS tablet Take 20 mg by mouth daily.     torsemide (DEMADEX) 20 MG tablet Take 20 mg by mouth daily as needed. For increase in patient weight greater than 3 lbs.    zolpidem (AMBIEN) 10 MG tablet Take 1 tablet (10 mg total) by mouth at bedtime as needed for sleep. Qty: 30 tablet, Refills: 0   Associated Diagnoses: Primary insomnia     !! - Potential duplicate medications found. Please discuss with provider.     No Known Allergies   Significant Diagnostic Studies: Dg Chest 2 View  Result Date: 11/23/2015 CLINICAL DATA:  Bilateral side pain for 1 week with hypoxia. EXAM: CHEST  2 VIEW COMPARISON:  11/22/2015 FINDINGS: The lungs are clear wiithout focal pneumonia, edema, pneumothorax or pleural effusion. The cardio pericardial silhouette is enlarged. There is pulmonary vascular congestion without overt pulmonary edema. Nodular areas in the peripheral aspect of the right mid lung were seen on CT scan from 2 weeks ago. Left Port-A-Cath tip projects at the SVC/RA junction. Telemetry leads overlie the chest. IMPRESSION: Cardiomegaly with pulmonary vascular congestion. Nodular density right mid lung better seen on recent CT scan.  Electronically Signed   By: Misty Stanley M.D.   On: 11/23/2015 10:34   Dg Chest 2 View  Result Date: 10/28/2015 CLINICAL DATA:  Sickle cell crisis, bilateral flank pain, former smoker, history of hypertension. EXAM: CHEST  2 VIEW COMPARISON:  PA and lateral chest x-ray of October 27, 2015 FINDINGS: The lungs are adequately inflated. The interstitial markings remain increased. The pulmonary vascularity remains engorged and the cardiac silhouette remains enlarged. There is no alveolar pneumonia nor pleural effusion or pneumothorax. The power port catheter tip projects over the distal third of the SVC. There is multilevel degenerative disc disease of the thoracic spine. IMPRESSION: Stable cardiomegaly with mild pulmonary vascular prominence. No pneumonia nor other acute cardiopulmonary abnormality. Electronically Signed   By: David  Martinique M.D.   On: 10/28/2015 09:32   Dg Chest 2 View  Result Date: 10/27/2015 CLINICAL DATA:  Sickle cell pain. New mid to left lateral chest pain this morning. EXAM: CHEST  2 VIEW COMPARISON:  10/23/2015 FINDINGS: The cardiac silhouette is enlarged but stable in appearance. Left-sided port catheter tip terminates in the proximal right atrium, unchanged. No pneumonic consolidation nor overt pulmonary edema. Minimal subpleural scarring in the right upper lobe. No  effusion or pneumothorax. No suspicious osseous abnormalities. 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 Right upper quadrant surgical clips consistent with cholecystectomy IMPRESSION: Stable cardiomegaly without acute pulmonary disease. Electronically Signed   By: Ashley Royalty M.D.   On: 10/27/2015 08:10   Ct Head Wo Contrast  Result Date: 11/10/2015 CLINICAL DATA:  Left-sided weakness. EXAM: CT HEAD WITHOUT CONTRAST TECHNIQUE: Contiguous axial images were obtained from the base of the skull through the vertex without intravenous contrast. COMPARISON:  None. FINDINGS: Brain: No evidence of  acute infarction, hemorrhage, extra-axial collection, ventriculomegaly, or mass effect. Old left basal ganglia 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 Generalized cerebral atrophy. Periventricular white matter low attenuation likely secondary to microangiopathy. Vascular: No significant cerebral vascular atherosclerotic calcifications are noted. Skull: Negative for fracture or focal lesion. Sinuses/Orbits: Visualized portions of the orbits are unremarkable. Visualized portions of the paranasal sinuses and mastoid air cells are unremarkable. Other: None. IMPRESSION: 1. No acute intracranial pathology. Electronically Signed   By: Kathreen Devoid   On: 11/10/2015 11:16   Ct Angio Chest Pe W/cm &/or Wo Cm  Result Date: 11/10/2015 CLINICAL DATA:  Left flank pain and lethargy. Difficulty walking and facial droop with slurred speech. Hypoxia. Shortness of breath. Sickle cell anemia. EXAM: CT ANGIOGRAPHY CHEST WITH CONTRAST TECHNIQUE: Multidetector CT imaging of the chest was performed using the standard protocol during bolus administration of intravenous contrast. Multiplanar CT image reconstructions and MIPs were obtained to evaluate the vascular anatomy. CONTRAST:  65 cc Isovue 370 COMPARISON:  Multiple exams, including 11/10/2015 chest radiograph, and CT of 11/30/2014 FINDINGS: Cardiovascular: Filling defect compatible with acute pulmonary embolus in the right middle lobe, right lower lobe, and potentially left lower lobe signal told pulmonary vessels. Marked cardiomegaly with elongation of the heart, RV:LV ratio 1.5 (abnormally elevated). Small pericardial effusion. Enlarged main pulmonary artery at 4.5 cm transverse. Left Port-A-Cath noted, tip a in the right atrium. Mediastinum/Nodes: Mass or confluent nodularity in the anterior mediastinum including a 1.7 by 4.5 cm anterior mediastinal component on image 50/4, confluent lymph nodes were also present previously but somewhat less striking. Right hilar  node 1.3 cm in short axis on image 57/4, previously 0.6 cm. A posterior right hilar node measures 1.4 cm in short axis on image 64/4, formerly 0.8 cm. Lungs/Pleura: Peripheral foraminal airspace opacity at the right lung apex is new possibly from pulmonary hemorrhage. There is bilateral primarily perihilar ground-glass opacities and patchy sub solid opacities in both lower lobes and to a lesser extent peripherally in the upper lobes. Likely a combination of edema and pulmonary hemorrhage along with scattered atelectasis. No significant pleural effusion. Upper Abdomen: Diffuse calcification of the spleen, similar to prior. 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 Endplate notching of the thoracic vertebra characteristic of sickle cell disease. Review of the MIP images confirms the above findings. IMPRESSION: 1. Acute pulmonary embolus is present in segmental branches of the right middle lobe and right lower lobe, and possibly the left lower lobe on today's exam. Positive for acute PE with CT evidence of right heart strain (RV/LV Ratio = 1.5) consistent with at least submassive (intermediate risk) PE. The presence of right heart strain has been associated with an increased risk of morbidity and mortality. Please activate Code PE by paging [phone]. 2. Stable marked cardiomegaly. 3. Small pericardial effusion. 4. Enlarged main pulmonary artery at 4.5 cm transverse. 5. Patchy ground-glass and peripheral opacities in both lungs likely a combination of mild pulmonary  edema, pulmonary hemorrhage, and scattered atelectasis. 6. Osseous findings of sickle cell disease. 7. There are enlarged mediastinal and right hilar lymph nodes, increased from prior. These may merit follow up in the nonacute setting to ensure resolution and exclude the possibility of entities such as lymphoproliferative disease. Critical Value/emergent results were called by telephone at the time of  interpretation on 11/10/2015 at 1:06 pm to Dr. Threasa Beards BELFI , who verbally acknowledged these results. Electronically Signed   By: Van Clines M.D.   On: 11/10/2015 13:08   Mr Brain Wo Contrast  Result Date: 11/10/2015 CLINICAL DATA:  Sickle cell disease. Gait disturbance and left facial droop. EXAM: MRI HEAD WITHOUT CONTRAST TECHNIQUE: Multiplanar, multiecho pulse sequences of the brain and surrounding structures were obtained without intravenous contrast. COMPARISON:  Head CT same day. FINDINGS: Brain: There are scattered acute infarctions. Small foci of acute infarction are seen within the left superior cerebellum and left posterior pons/midbrain junction. There is acute infarction within the left occipital cortex measuring about 2 in diameter. There is a small acute infarction within the left corpus callosum more cingulate gyrus. A few punctate acute infarctions are seen in the left frontoparietal cortex. No acute infarction affecting the right hemisphere. There are multiple old bilateral cerebellar infarctions. There are old small vessel infarctions affecting the thalami I and basal ganglia left more than right. No mass lesion, hemorrhage, hydrocephalus or extra-axial collection. Vascular: Major vessels at the base of the brain show flow. Skull and upper cervical spine: Negative except for chronic marrow changes of sickle cell. Sinuses/Orbits: Chronic marrow changes of sickle cell. No inflammatory sinus disease. Orbits negative. Other: None significant IMPRESSION: Background pattern of old infarctions throughout the cerebellum and old lacunar infarctions affecting the thalami basal ganglia. Multiple acute infarctions including areas of involvement in the left cerebellum, left dorsal pons midbrain junction, left occipital lobe, the left corpus callosum or cingulate gyrus, and a few small punctate foci in the left frontoparietal cortex. Findings are most consistent with embolic disease from the heart  or ascending aorta given the combination of anterior and posterior circulation infarctions. Electronically Signed   By: Nelson Chimes M.D.   On: 11/10/2015 16:33   Dg Chest Port 1 View  Result Date: 11/22/2015 CLINICAL DATA:  Sickle-cell crisis, acute chest syndrome EXAM: PORTABLE CHEST 1 VIEW COMPARISON:  11/10/2015 FINDINGS: Marked cardiomegaly with mild vascular congestion and diffuse interstitial prominence. Basilar peripheral Kerley B-lines noted suspicious for mild basilar interstitial edema pattern. There is associated increased bibasilar atelectasis. No large effusion or pneumothorax. Trachea is midline. Left subclavian power port catheter tip at the SVC RA junction. IMPRESSION: Marked cardiomegaly with mild basilar interstitial edema pattern and increased basilar atelectasis. Electronically Signed   By: Jerilynn Mages.  Shick M.D.   On: 11/22/2015 08:40   Dg Chest Port 1 View  Result Date: 11/10/2015 CLINICAL DATA:  History of sickle cell disease, now with shortness of breath and hypoxia. EXAM: PORTABLE CHEST 1 VIEW COMPARISON:  10/28/2015; 09/26/2015; 08/07/2015 FINDINGS: Similar findings of enlarged cardiac silhouette and mediastinal contours given reduced lung volumes. Pulmonary vasculature is indistinct with cephalization of flow. Grossly unchanged bilateral infrahilar heterogeneous opacities favored to represent atelectasis. Stable position of support apparatus. Trace pleural effusions are not excluded. No pneumothorax. 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 IMPRESSION: 1. Findings worrisome for pulmonary edema on this AP portable examination. Note, atypical infection could have a similar appearance. Further evaluation with a PA and lateral chest radiograph may  be obtained as clinically indicated. 2. Similar findings of cardiomegaly. Electronically Signed   By: Sandi Mariscal M.D.   On: 11/10/2015 10:49    Signed:  [name] MD, [place], [place],  [place], CPE   11/23/2015, 4:38 PM

## 2015-11-23 NOTE — Progress Notes (Signed)
Pt received to the [facility] for treatment of sickle cell crises from the ED. Pt's porta cath was already accessed. Port flushes without difficulty. Pt's pain # was 8 on admission to the unit. Pt was treated with IV fluids, Dilaudid PCA and rest. Pt's pain # at discharge was 4. Pt was placed on oxygen to keep his O2 sats greater than 89-90. Pt was alert,oriented and ambulatory at discharge. Discharge instructions given with verbal understanding.

## 2015-11-23 NOTE — ED Notes (Signed)
MD at bedside. 

## 2015-11-23 NOTE — ED Triage Notes (Signed)
Patient c/o bilat side, leg and back pain since yesterday.  Patient has Sickle cell.

## 2015-11-23 NOTE — Telephone Encounter (Signed)
Notified Dr. Doreene Burke of patient's complaints of pain. Advised for patient to seek treatment in the ED. Patient notified and voices understanding.

## 2015-11-23 NOTE — H&P (Signed)
[facility] History and Physical  [name] [id number] DOB: 1980-05-15 DOA: 11/23/2015  PCP: [name], MD   Chief Complaint: Pain  HPI: [name] is a 35 y.o. male with extensive medical history which include sickle cell disease with multiple complications, multiple PEs on chronic anticoagulations with doubtful adherence, Pulm HTN on home oxygen, non-adherent to oxygen use, recently had another PE and CVA, transferred to [facility] for plasmapheresis, presented to the ED this mornin with complaint of 9/10 pain in bilateral side, bilateral back and bilateral rib pain. Denies fever, chest pain, N/V, Diarrhea, abdominal pain or priapism. He took Dilaudid and MS Contin at 6 am without relief. He was treated in the ED as per sickle cell crisis protocol but pain continued, and patient was transferred to the [facility] for extended observation and pain management. Patient was also in the ED yesterday for the same complaint. He has no new symptom of slurred speech, no new weakness or numbness. In the ED, his oxygen level was down to 88% on RA but improved with oxygen by . He denies SOB. CXR showed cardiomegaly with Pulm vascular congestion.  Systemic Review: General: The patient denies anorexia, fever, weight loss Cardiac: Denies chest pain, syncope, palpitations, pedal edema  Respiratory: Denies cough, shortness of breath, wheezing GI: Denies severe indigestion/heartburn, abdominal pain, nausea, vomiting, diarrhea and constipation GU: Denies hematuria, incontinence, dysuria  Musculoskeletal: Denies arthritis  Skin: Denies suspicious skin lesions Neurologic: Denies focal weakness or numbness, change in vision  Past Medical History:  Diagnosis Date  . Acute chest syndrome ([place]) 06/18/2013  . Acute embolism and thrombosis of right internal jugular vein ([place])   . Alcohol consumption of one to four drinks per day   . Avascular necrosis (HCC)    Right Hip   . Blood transfusion   . Chronic anticoagulation   . Demand ischemia ([place]) 01/02/2014  . Former smoker   . Functional asplenia   . Hb-SS disease with crisis ([place])   . History of Clostridium difficile infection   . History of pulmonary embolus (PE)   . Hypertension   . Hypokalemia   . Leukocytosis    Chronic  . Mood disorder ([place])   . Noncompliance with medication regimen   . Oxygen deficiency   . Pulmonary hypertension   . Second hand tobacco smoke exposure   . Sickle cell anemia (HCC)   . Sickle-cell crisis with associated acute chest syndrome ([place]) 05/13/2013  . Thrombocytosis (HCC)    Chronic  . Uses marijuana     Past Surgical History:  Procedure Laterality Date  . CHOLECYSTECTOMY     01/2008  . Excision of left periauricular cyst     10/2009  . Excision of right ear lobe cyst with primary closur     11/2007  . Porta cath placement    . Porta cath removal    . PORTACATH PLACEMENT  01/05/2012   Procedure: INSERTION PORT-A-CATH;  Surgeon: Odis Hollingshead, MD;  Location: [facility];  Service: General;  Laterality: N/A;  ultrasound guiced port a cath insertion with fluoroscopy  . Right hip replacement     08/2006  . UMBILICAL HERNIA REPAIR     01/2008    No Known Allergies  Family History  Problem Relation Age of Onset  . Sickle cell trait Mother   . Depression Mother   . Diabetes Mother   . Sickle cell trait Father   . Sickle cell trait Brother  Prior to Admission medications   Medication Sig Start Date End Date Taking? Authorizing Provider  ambrisentan (LETAIRIS) 5 MG tablet Take 5 mg by mouth at bedtime.    Historical Provider, MD  aspirin 81 MG chewable tablet Chew 1 tablet (81 mg total) by mouth daily. 07/23/14   [name], MD  Cholecalciferol (VITAMIN D) 2000 units tablet Take 1 tablet (2,000 Units total) by mouth daily. 02/23/15   Dorena Dew, FNP  Deferasirox (JADENU) 360 MG TABS Take 1,080 mg by mouth daily.     Historical Provider, MD   folic acid (FOLVITE) 1 MG tablet Take 1 tablet (1 mg total) by mouth every morning. 02/23/15   Dorena Dew, FNP  gabapentin (NEURONTIN) 300 MG capsule Take 1 capsule (300 mg total) by mouth 3 (three) times daily. 06/01/15   Tresa Garter, MD  HYDROmorphone (DILAUDID) 4 MG tablet Take 1 tablet (4 mg total) by mouth every 4 (four) hours as needed for severe pain. 11/06/15   Tresa Garter, MD  hydroxyurea (HYDREA) 500 MG capsule Take 1,000 mg by mouth daily. May take with food to minimize GI side effects.    Historical Provider, MD  lisinopril (PRINIVIL,ZESTRIL) 10 MG tablet  11/13/15   Historical Provider, MD  lisinopril (PRINIVIL,ZESTRIL) 5 MG tablet Take 1 tablet (5 mg total) by mouth daily. 08/04/15   [name], MD  morphine (MS CONTIN) 30 MG 12 hr tablet Take 1 tablet (30 mg total) by mouth every 12 (twelve) hours. 11/06/15   Tresa Garter, MD  rivaroxaban (XARELTO) 20 MG TABS tablet Take 20 mg by mouth daily.     Historical Provider, MD  torsemide (DEMADEX) 20 MG tablet Take 20 mg by mouth daily as needed. For increase in patient weight greater than 3 lbs. 07/27/15 07/26/16  Historical Provider, MD  zolpidem (AMBIEN) 10 MG tablet Take 1 tablet (10 mg total) by mouth at bedtime as needed for sleep. 11/07/15 12/07/15  Tresa Garter, MD     Physical Exam: There were no vitals filed for this visit.  General: Alert, awake, afebrile, anicteric, not in obvious distress HEENT: Normocephalic and Atraumatic, Mucous membranes pink                PERRLA; EOM intact; No scleral icterus,                 Nares: Patent, Oropharynx: Clear, Fair Dentition                 Neck: FROM, no cervical lymphadenopathy, thyromegaly, carotid bruit or JVD;  CHEST WALL: No tenderness  CHEST: Normal respiration, clear to auscultation bilaterally  HEART: Regular rate and rhythm; ++ systolic murmurs, no rubs or gallops  BACK: No kyphosis or scoliosis; no CVA tenderness  ABDOMEN: Positive  Bowel Sounds, soft, non-tender; no masses, no organomegaly EXTREMITIES: No cyanosis, clubbing, or edema SKIN:  no rash or ulceration  CNS: Alert and Oriented x 4, Nonfocal exam, CN 2-12 intact  Labs on Admission:  Basic Metabolic Panel:  Recent Labs Lab 11/22/15 0803 11/23/15 0952  NA 138 139  K 3.5 4.0  CL 109 110  CO2 24 24  GLUCOSE 109* 128*  BUN 11 21*  CREATININE 0.79 1.33*  CALCIUM 8.8* 8.6*   Liver Function Tests:  Recent Labs Lab 11/23/15 0952  AST 26  ALT 17  ALKPHOS 73  BILITOT 3.5*  PROT 7.1  ALBUMIN 4.0   No results for input(s): LIPASE, AMYLASE  in the last 168 hours. No results for input(s): AMMONIA in the last 168 hours. CBC:  Recent Labs Lab 11/23/15 0952  WBC 14.9*  NEUTROABS 11.2*  HGB 6.9*  HCT 21.6*  MCV 91.9  PLT 610*   Cardiac Enzymes: No results for input(s): CKTOTAL, CKMB, CKMBINDEX, TROPONINI in the last 168 hours.  BNP (last 3 results)  Recent Labs  11/30/14 0910 08/02/15 2307  BNP 639.7* 503.2*    ProBNP (last 3 results) No results for input(s): PROBNP in the last 8760 hours.  CBG: No results for input(s): GLUCAP in the last 168 hours.   Assessment/Plan Active Problems:   Sickle cell anemia with pain (HCC)   [facility]  IVF D5 .45% Saline @ 100 mls/hour, gentle hydration. Serum creatinine elevated, possibly from dehydration but his pulm vascular congestion precludes aggressive hydration.  Weight based Dilaudid PCA started within 30 minutes of admission  IV Toradol 30 mg Q 6 H  Monitor vitals very closely, Re-evaluate pain scale every hour  3 L of Oxygen by [place]  Patient will be re-evaluated for pain in the context of function and relationship to baseline as care progresses.  If no significant relieve from pain (remains above 5/10) will transfer patient to inpatient services for further evaluation and management  Code Status: Full  Family Communication: None  DVT Prophylaxis: Ambulate as  tolerated   Time spent: 76 Minutes  Josephene Marrone, MD, MHA, FACP, FAAP, CPE  If 7PM-7AM, please contact night-coverage [email or web address] 11/23/2015, 11:11 AM

## 2015-11-27 ENCOUNTER — Inpatient Hospital Stay (HOSPITAL_COMMUNITY)
Admission: EM | Admit: 2015-11-27 | Discharge: 2015-11-28 | DRG: 811 | Disposition: A | Discharge status: Home / Self Care | Payer: Medicare Other | Attending: Internal Medicine | Admitting: Internal Medicine

## 2015-11-27 ENCOUNTER — Emergency Department (HOSPITAL_COMMUNITY): Payer: Medicare Other

## 2015-11-27 ENCOUNTER — Encounter (HOSPITAL_COMMUNITY): Payer: Self-pay | Admitting: *Deleted

## 2015-11-27 DIAGNOSIS — Z87891 Personal history of nicotine dependence: Secondary | ICD-10-CM

## 2015-11-27 DIAGNOSIS — D571 Sickle-cell disease without crisis: Secondary | ICD-10-CM | POA: Diagnosis present

## 2015-11-27 DIAGNOSIS — Z79899 Other long term (current) drug therapy: Secondary | ICD-10-CM | POA: Diagnosis not present

## 2015-11-27 DIAGNOSIS — I2781 Cor pulmonale (chronic): Secondary | ICD-10-CM | POA: Diagnosis not present

## 2015-11-27 DIAGNOSIS — Z7901 Long term (current) use of anticoagulants: Secondary | ICD-10-CM

## 2015-11-27 DIAGNOSIS — R4701 Aphasia: Secondary | ICD-10-CM | POA: Diagnosis present

## 2015-11-27 DIAGNOSIS — Z86711 Personal history of pulmonary embolism: Secondary | ICD-10-CM | POA: Diagnosis present

## 2015-11-27 DIAGNOSIS — I2699 Other pulmonary embolism without acute cor pulmonale: Secondary | ICD-10-CM | POA: Diagnosis not present

## 2015-11-27 DIAGNOSIS — I69920 Aphasia following unspecified cerebrovascular disease: Secondary | ICD-10-CM | POA: Diagnosis not present

## 2015-11-27 DIAGNOSIS — D5700 Hb-SS disease with crisis, unspecified: Principal | ICD-10-CM | POA: Diagnosis present

## 2015-11-27 DIAGNOSIS — I2721 Secondary pulmonary arterial hypertension: Secondary | ICD-10-CM | POA: Diagnosis present

## 2015-11-27 DIAGNOSIS — Z8673 Personal history of transient ischemic attack (TIA), and cerebral infarction without residual deficits: Secondary | ICD-10-CM | POA: Diagnosis present

## 2015-11-27 DIAGNOSIS — I480 Paroxysmal atrial fibrillation: Secondary | ICD-10-CM | POA: Diagnosis present

## 2015-11-27 DIAGNOSIS — R079 Chest pain, unspecified: Secondary | ICD-10-CM | POA: Diagnosis not present

## 2015-11-27 DIAGNOSIS — Z96641 Presence of right artificial hip joint: Secondary | ICD-10-CM | POA: Diagnosis present

## 2015-11-27 DIAGNOSIS — F5101 Primary insomnia: Secondary | ICD-10-CM

## 2015-11-27 DIAGNOSIS — I10 Essential (primary) hypertension: Secondary | ICD-10-CM | POA: Diagnosis not present

## 2015-11-27 DIAGNOSIS — I482 Chronic atrial fibrillation, unspecified: Secondary | ICD-10-CM | POA: Diagnosis present

## 2015-11-27 DIAGNOSIS — J9610 Chronic respiratory failure, unspecified whether with hypoxia or hypercapnia: Secondary | ICD-10-CM | POA: Diagnosis not present

## 2015-11-27 DIAGNOSIS — Z7982 Long term (current) use of aspirin: Secondary | ICD-10-CM | POA: Diagnosis not present

## 2015-11-27 DIAGNOSIS — R4781 Slurred speech: Secondary | ICD-10-CM | POA: Diagnosis present

## 2015-11-27 LAB — COMPREHENSIVE METABOLIC PANEL
ALBUMIN: 4.3 g/dL (ref 3.5–5.0)
ALT: 28 U/L (ref 17–63)
AST: 38 U/L (ref 15–41)
Alkaline Phosphatase: 77 U/L (ref 38–126)
Anion gap: 5 (ref 5–15)
BUN: 13 mg/dL (ref 6–20)
CHLORIDE: 109 mmol/L (ref 101–111)
CO2: 26 mmol/L (ref 22–32)
CREATININE: 0.84 mg/dL (ref 0.61–1.24)
Calcium: 9 mg/dL (ref 8.9–10.3)
GFR calc Af Amer: >60 mL/min (ref >60)
GFR calc non Af Amer: >60 mL/min (ref >60)
GLUCOSE: 118 mg/dL — AB (ref 65–99)
Potassium: 3.6 mmol/L (ref 3.5–5.1)
SODIUM: 140 mmol/L (ref 135–145)
Total Bilirubin: 4.5 mg/dL — ABNORMAL HIGH (ref 0.3–1.2)
Total Protein: 7.5 g/dL (ref 6.5–8.1)

## 2015-11-27 LAB — CBC WITH DIFFERENTIAL/PLATELET
Basophils Absolute: 0.1 10*3/uL (ref 0.0–0.1)
Basophils Relative: 1 %
EOS ABS: 0.3 10*3/uL (ref 0.0–0.7)
EOS PCT: 3 %
HCT: 23.3 % — ABNORMAL LOW (ref 39.0–52.0)
HEMOGLOBIN: 7.6 g/dL — AB (ref 13.0–17.0)
LYMPHS ABS: 1.9 10*3/uL (ref 0.7–4.0)
Lymphocytes Relative: 17 %
MCH: 29.6 pg (ref 26.0–34.0)
MCHC: 32.6 g/dL (ref 30.0–36.0)
MCV: 90.7 fL (ref 78.0–100.0)
MONO ABS: 1.6 10*3/uL — AB (ref 0.1–1.0)
MONOS PCT: 14 %
NEUTROS PCT: 65 %
Neutro Abs: 7.4 10*3/uL (ref 1.7–7.7)
Platelets: 620 10*3/uL — ABNORMAL HIGH (ref 150–400)
RBC: 2.57 MIL/uL — ABNORMAL LOW (ref 4.22–5.81)
RDW: 19.7 % — ABNORMAL HIGH (ref 11.5–15.5)
WBC: 11.3 10*3/uL — ABNORMAL HIGH (ref 4.0–10.5)

## 2015-11-27 LAB — RETICULOCYTES
RBC.: 2.57 MIL/uL — ABNORMAL LOW (ref 4.22–5.81)
Retic Count, Absolute: 138.8 K/uL (ref 19.0–186.0)
Retic Ct Pct: 5.4 % — ABNORMAL HIGH (ref 0.4–3.1)

## 2015-11-27 IMAGING — CR DG CHEST 2V
2 series · 2 of 2 positions shown · non-contrast
Comparison: 11/17/2012 CT

CLINICAL DATA: Sickle cell crisis.

EXAM:
CHEST  2 VIEW

[w chest pa]
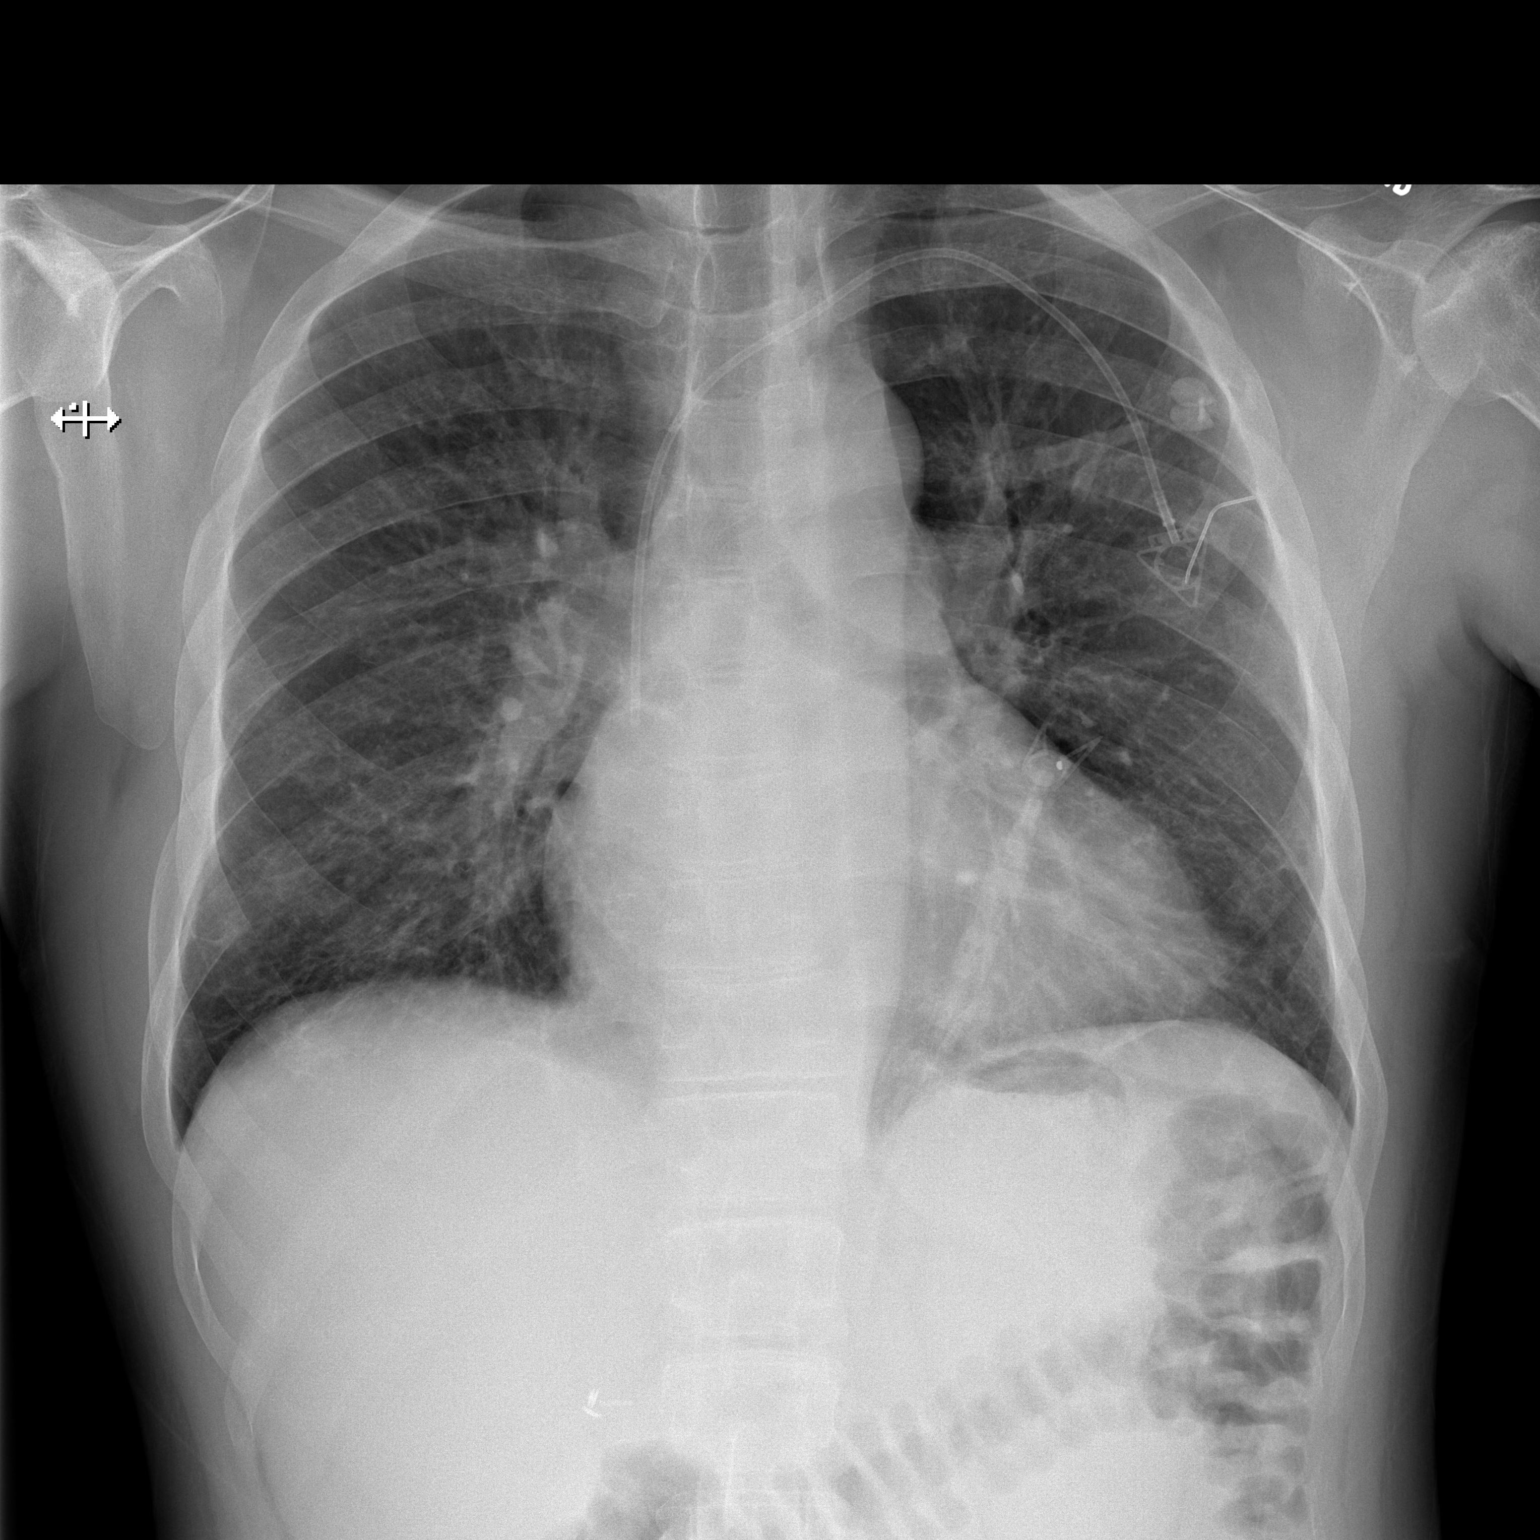

[w chest lat]
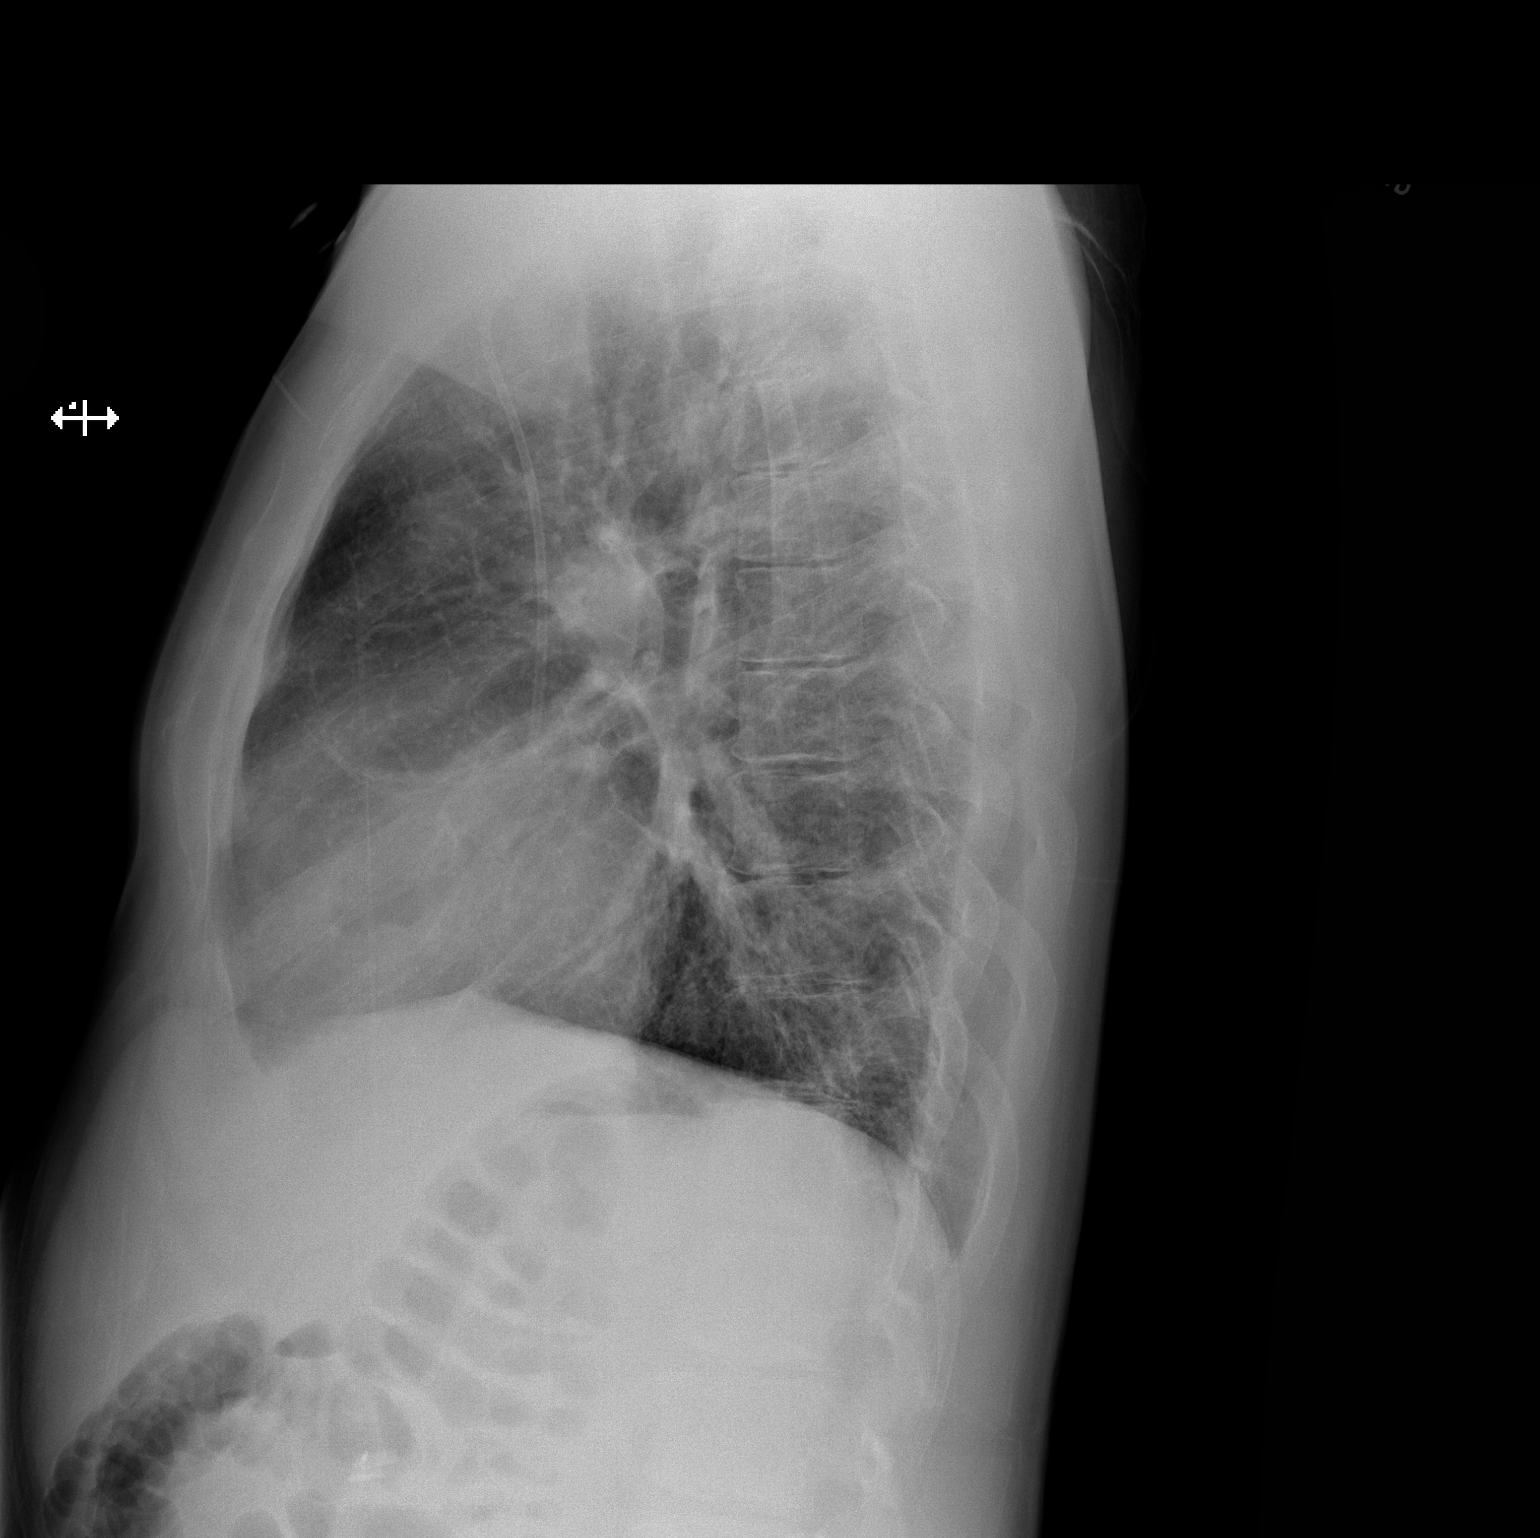

[2 of 2 positions shown; findings below may reference images not displayed]

FINDINGS: Mild cardiac enlargement and central vascular prominence. Linear
retrocardiac opacity. No pleural effusion or pneumothorax. Surgical
clips right upper quadrant. Left chest wall Port-A-Cath with tip
projecting over the mid SVC. No acute osseous finding.
IMPRESSION: Cardiomegaly and central vascular prominence, similar to prior.

Retrocardiac opacity has decreased compared to the prior and now
appears linear, therefore favored to reflect atelectasis/ scarring.

## 2015-11-27 MED ORDER — DEXTROSE-NACL 5-0.45 % IV SOLN
INTRAVENOUS | Status: DC
  Administered 2015-11-27 – 2015-11-28 (×2): via INTRAVENOUS

## 2015-11-27 MED ORDER — KETOROLAC TROMETHAMINE 15 MG/ML IJ SOLN
15.0000 mg | INTRAMUSCULAR | Status: AC
  Administered 2015-11-27: 15 mg via INTRAVENOUS
  Filled 2015-11-27: qty 1

## 2015-11-27 MED ORDER — HYDROMORPHONE HCL 2 MG/ML IJ SOLN
2.0000 mg | INTRAMUSCULAR | Status: AC
  Administered 2015-11-28: 2 mg via INTRAVENOUS
  Filled 2015-11-27: qty 1

## 2015-11-27 MED ORDER — HYDROMORPHONE HCL 2 MG/ML IJ SOLN: 2.0000 mg | INTRAMUSCULAR | Status: AC

## 2015-11-27 MED ORDER — ONDANSETRON HCL 4 MG/2ML IJ SOLN: 4.0000 mg | INTRAMUSCULAR | Status: DC | PRN

## 2015-11-27 MED ORDER — HYDROMORPHONE HCL 2 MG/ML IJ SOLN
2.0000 mg | INTRAMUSCULAR | Status: DC
  Administered 2015-11-28: 2 mg via INTRAVENOUS

## 2015-11-27 MED ORDER — HYDROMORPHONE HCL 2 MG/ML IJ SOLN: 2.0000 mg | INTRAMUSCULAR | Status: DC

## 2015-11-27 MED ORDER — HYDROMORPHONE HCL 2 MG/ML IJ SOLN
2.0000 mg | INTRAMUSCULAR | Status: DC
  Filled 2015-11-27: qty 1

## 2015-11-27 MED ORDER — HYDROMORPHONE HCL 1 MG/ML IJ SOLN
0.5000 mg | Freq: Once | INTRAMUSCULAR | Status: AC
  Administered 2015-11-27: 0.5 mg via SUBCUTANEOUS
  Filled 2015-11-27: qty 1

## 2015-11-27 MED ORDER — ONDANSETRON HCL 4 MG/2ML IJ SOLN
4.0000 mg | INTRAMUSCULAR | Status: DC | PRN
  Administered 2015-11-27: 4 mg via INTRAVENOUS
  Filled 2015-11-27: qty 2

## 2015-11-27 MED ORDER — DIPHENHYDRAMINE HCL 50 MG/ML IJ SOLN: 25.0000 mg | Freq: Once | INTRAMUSCULAR | Status: DC

## 2015-11-27 MED ORDER — DIPHENHYDRAMINE HCL 50 MG/ML IJ SOLN
25.0000 mg | Freq: Once | INTRAMUSCULAR | Status: AC
  Administered 2015-11-27: 25 mg via INTRAVENOUS
  Filled 2015-11-27: qty 1

## 2015-11-27 MED ORDER — HYDROMORPHONE HCL 2 MG/ML IJ SOLN
2.0000 mg | INTRAMUSCULAR | Status: AC
  Administered 2015-11-27: 2 mg via INTRAVENOUS
  Filled 2015-11-27: qty 1

## 2015-11-27 NOTE — ED Triage Notes (Signed)
Pt c/o sickle cell pain; pt states that he has had chest pain and side pain for 2 days; pt states that his home meds are not helping

## 2015-11-27 NOTE — ED Provider Notes (Signed)
[facility] DEPT Provider Note   CSN: [id number] Arrival date & time: 11/27/15  2218  By signing my name below, I, Julien Nordmann, attest that this documentation has been prepared under the direction and in the presence of Waynetta Pean, PA-C.  Electronically Signed: Julien Nordmann, ED Scribe. 11/27/15. 11:30 PM.    History   Chief Complaint Chief Complaint  Patient presents with  . Sickle Cell Pain Crisis    The history is provided by the patient and medical records. No language interpreter was used.   HPI Comments: [name] is a 35 y.o. male who has a PMHx of sickle cell anemia, HTN, PE, acute chest syndrome, aphasia, and hemochromatosis presents to the Emergency Department presenting with a sickle cell pain crisis x 2-3 days. Pt reports having bilateral lateral chest pain. He notes this current pain is mildly different than his normal sickle cell pain due to the pain coming around to his chest. Associated cough noted.  He has been taking dilaudid and MS contin for his sickle cell pain. Pt states he had a stroke two weeks ago and has been placed on Xarelto. Denies hemoptysis, shortness of breath, abdominal pain, nausea, vomiting, diarrhea, leg pain, or leg swelling.   PCP: [name], MD  Past Medical History:  Diagnosis Date  . Acute chest syndrome ([place]) 06/18/2013  . Acute embolism and thrombosis of right internal jugular vein ([place])   . Alcohol consumption of one to four drinks per day   . Avascular necrosis (HCC)    Right Hip  . Blood transfusion   . Chronic anticoagulation   . Demand ischemia ([place]) 01/02/2014  . Former smoker   . Functional asplenia   . Hb-SS disease with crisis ([place])   . History of Clostridium difficile infection   . History of pulmonary embolus (PE)   . Hypertension   . Hypokalemia   . Leukocytosis    Chronic  . Mood disorder ([place])   . Noncompliance with medication regimen   . Oxygen deficiency   . Pulmonary hypertension   .  Second hand tobacco smoke exposure   . Sickle cell anemia (HCC)   . Sickle-cell crisis with associated acute chest syndrome ([place]) 05/13/2013  . Thrombocytosis (HCC)    Chronic  . Uses marijuana     Patient Active Problem List   Diagnosis Date Noted  . Slurred speech 11/10/2015  . Pulmonary emboli ([place]) 11/10/2015  . Aphasia   . Acute chest syndrome ([place])   . Ischemic stroke ([place])   . Sickle cell crisis ([place]) 10/27/2015  . Sickle cell pain crisis ([place]) 09/18/2015  . Sickle cell anemia with crisis ([place]) 09/04/2015  . Shortness of breath   . Pulmonary embolism without acute cor pulmonale ([place])   . Fever   . Sickle cell anemia with pain ([place]) 05/07/2015  . Acute kidney injury ([place])   . Hypoxia   . Anemia 01/31/2015  . Symptomatic anemia   . Hb-SS disease without crisis ([place]) 10/07/2014  . Acute on chronic respiratory failure with hypoxia ([place])   . PVC's (premature ventricular contractions) 09/10/2014  . Abnormal EKG 09/10/2014  . Chronic atrial fibrillation ([place]) 09/09/2014  . Cor pulmonale, chronic ([place]) 08/25/2014  . Sickle cell anemia ([place]) 06/25/2014  . Anemia of chronic disease 06/25/2014  . PAH (pulmonary artery hypertension) 03/18/2014  . Hb-SS disease with crisis ([place]) 01/22/2014  . Pulmonary embolus ([place]) 01/01/2014  . Paralytic strabismus, external ophthalmoplegia   . Chronic pain syndrome 12/12/2013  .  Elevated troponin 11/26/2013  . Chronic anticoagulation 08/22/2013  . Essential hypertension 08/22/2013  . Pulmonary HTN 06/18/2013  . Functional asplenia   . Vitamin D deficiency 02/13/2013  . Hx of pulmonary embolus 06/29/2012  . Hemochromatosis 12/14/2011  . Avascular necrosis [facility])     Past Surgical History:  Procedure Laterality Date  . CHOLECYSTECTOMY     01/2008  . Excision of left periauricular cyst     10/2009  . Excision of right ear lobe cyst with primary closur     11/2007  . Porta cath placement    . Porta cath removal    . PORTACATH  PLACEMENT  01/05/2012   Procedure: INSERTION PORT-A-CATH;  Surgeon: Odis Hollingshead, MD;  Location: [place];  Service: General;  Laterality: N/A;  ultrasound guiced port a cath insertion with fluoroscopy  . Right hip replacement     08/2006  . UMBILICAL HERNIA REPAIR     01/2008       Home Medications    Prior to Admission medications   Medication Sig Start Date End Date Taking? Authorizing Provider  ambrisentan (LETAIRIS) 5 MG tablet Take 5 mg by mouth at bedtime.   Yes Historical Provider, MD  aspirin 81 MG chewable tablet Chew 1 tablet (81 mg total) by mouth daily. 07/23/14  Yes [name], MD  Cholecalciferol (VITAMIN D) 2000 units tablet Take 1 tablet (2,000 Units total) by mouth daily. 02/23/15  Yes Dorena Dew, FNP  Deferasirox (JADENU) 360 MG TABS Take 1,080 mg by mouth daily.    Yes Historical Provider, MD  folic acid (FOLVITE) 1 MG tablet Take 1 tablet (1 mg total) by mouth every morning. 02/23/15  Yes Dorena Dew, FNP  gabapentin (NEURONTIN) 300 MG capsule Take 1 capsule (300 mg total) by mouth 3 (three) times daily. 06/01/15  Yes Tresa Garter, MD  HYDROmorphone (DILAUDID) 4 MG tablet Take 1 tablet (4 mg total) by mouth every 4 (four) hours as needed for severe pain. 11/06/15  Yes Tresa Garter, MD  hydroxyurea (HYDREA) 500 MG capsule Take 1,000 mg by mouth daily. May take with food to minimize GI side effects.   Yes Historical Provider, MD  lisinopril (PRINIVIL,ZESTRIL) 5 MG tablet Take 1 tablet (5 mg total) by mouth daily. 08/04/15  Yes [name], MD  morphine (MS CONTIN) 30 MG 12 hr tablet Take 1 tablet (30 mg total) by mouth every 12 (twelve) hours. 11/06/15  Yes Tresa Garter, MD  rivaroxaban (XARELTO) 20 MG TABS tablet Take 20 mg by mouth daily.    Yes Historical Provider, MD  torsemide (DEMADEX) 20 MG tablet Take 20 mg by mouth daily as needed. For increase in patient weight greater than 3 lbs. 07/27/15 07/26/16 Yes Historical Provider,  MD  zolpidem (AMBIEN) 10 MG tablet Take 1 tablet (10 mg total) by mouth at bedtime as needed for sleep. 11/07/15 12/07/15 Yes Tresa Garter, MD    Family History Family History  Problem Relation Age of Onset  . Sickle cell trait Mother   . Depression Mother   . Diabetes Mother   . Sickle cell trait Father   . Sickle cell trait Brother     Social History Social History  Substance Use Topics  . Smoking status: Former Smoker    Packs/day: 0.50    Years: 10.00    Types: Cigarettes    Quit date: 05/29/2011  . Smokeless tobacco: Never Used  . Alcohol use No  Allergies   Review of patient's allergies indicates no known allergies.   Review of Systems Review of Systems  Constitutional: Negative for chills and fever.  HENT: Negative for congestion and sore throat.   Eyes: Negative for visual disturbance.  Respiratory: Positive for cough. Negative for shortness of breath and wheezing.   Cardiovascular: Positive for chest pain. Negative for palpitations and leg swelling.  Gastrointestinal: Negative for abdominal pain, diarrhea, nausea and vomiting.  Genitourinary: Negative for dysuria.  Musculoskeletal: Negative for back pain and neck pain.  Skin: Negative for rash.  Neurological: Negative for headaches.     Physical Exam Updated Vital Signs BP 101/76 (BP Location: Right Arm)   Pulse 66   Temp 98 F (36.7 C) (Oral)   Resp 18   Ht 6' (1.829 m)   Wt 79.4 kg   SpO2 96%   BMI 23.74 kg/m   Physical Exam  Constitutional: He is oriented to person, place, and time. He appears well-developed and well-nourished. No distress.  Nontoxic appearing.  HENT:  Head: Normocephalic and atraumatic.  Right Ear: External ear normal.  Left Ear: External ear normal.  Mouth/Throat: Oropharynx is clear and moist.  Eyes: Conjunctivae are normal. Pupils are equal, round, and reactive to light. Right eye exhibits no discharge. Left eye exhibits no discharge.  Neck: Neck supple. No  JVD present.  Cardiovascular: Normal rate, regular rhythm, normal heart sounds and intact distal pulses.  Exam reveals no gallop and no friction rub.   No murmur heard. Bilateral radial, posterior tibialis and dorsalis pedis pulses are intact.    Pulmonary/Chest: Effort normal and breath sounds normal. No stridor. No respiratory distress. He has no wheezes. He has no rales.  Lungs clear to ausculation Chest non-tender to palpation  Abdominal: Soft. There is no tenderness. There is no guarding.  Musculoskeletal: Normal range of motion. He exhibits edema. He exhibits no tenderness or deformity.  Mild pedal edema bilaterally, no calf edema tenderness  Lymphadenopathy:    He has no cervical adenopathy.  Neurological: He is alert and oriented to person, place, and time. Coordination normal.  Skin: Skin is warm and dry. Capillary refill takes less than 2 seconds. No rash noted. He is not diaphoretic. No erythema. No pallor.  Psychiatric: He has a normal mood and affect. His behavior is normal.  Nursing note and vitals reviewed.    ED Treatments / Results  DIAGNOSTIC STUDIES: Oxygen Saturation is 94% on RA, low by my interpretation.  COORDINATION OF CARE:  11:27 PM Discussed treatment plan with pt at bedside and pt agreed to plan.  Labs (all labs ordered are listed, but only abnormal results are displayed) Labs Reviewed  COMPREHENSIVE METABOLIC PANEL - Abnormal; Notable for the following:       Result Value   Glucose, Bld 118 (*)    Total Bilirubin 4.5 (*)    All other components within normal limits  CBC WITH DIFFERENTIAL/PLATELET - Abnormal; Notable for the following:    WBC 11.3 (*)    RBC 2.57 (*)    Hemoglobin 7.6 (*)    HCT 23.3 (*)    RDW 19.7 (*)    Platelets 620 (*)    Monocytes Absolute 1.6 (*)    All other components within normal limits  RETICULOCYTES - Abnormal; Notable for the following:    Retic Ct Pct 5.4 (*)    RBC. 2.57 (*)    All other components within  normal limits  Radiology Dg Chest 2 View  Result Date: 11/27/2015 CLINICAL DATA:  Sickle cell disease.  Chest pain for 2-3 days. EXAM: CHEST  2 VIEW COMPARISON:  Chest radiograph 11/23/2015 FINDINGS: There is a left chest wall Port-A-Cath with the tip in the proximal right atrium, unchanged. Unchanged cardiomegaly. No pulmonary edema. No focal airspace consolidation. No pleural effusion or pneumothorax. Unchanged hazy opacities and nodular density of the right mid lung. IMPRESSION: Unchanged cardiomegaly without overt pulmonary edema. Unchanged hazy opacities in the right mid lung. Electronically Signed   By: Ulyses Jarred M.D.   On: 11/27/2015 23:05    Procedures Procedures (including critical care time)  Medications Ordered in ED Medications  dextrose 5 %-0.45 % sodium chloride infusion ( Intravenous New Bag/Given 11/28/15 0337)  ondansetron (ZOFRAN) injection 4 mg (not administered)  hydroxyurea (HYDREA) capsule 1,000 mg (not administered)  morphine (MS CONTIN) 12 hr tablet 30 mg (30 mg Oral Given 11/28/15 0337)  zolpidem (AMBIEN) tablet 10 mg (not administered)  ambrisentan (LETAIRIS) tablet 5 mg (not administered)  rivaroxaban (XARELTO) tablet 20 mg (not administered)  Deferasirox TABS 1,080 mg (not administered)  gabapentin (NEURONTIN) capsule 300 mg (not administered)  cholecalciferol (VITAMIN D) tablet 2,000 Units (not administered)  folic acid (FOLVITE) tablet 1 mg (not administered)  aspirin chewable tablet 81 mg (not administered)  senna-docusate (Senokot-S) tablet 1 tablet (1 tablet Oral Not Given 11/28/15 0342)  polyethylene glycol (MIRALAX / GLYCOLAX) packet 17 g (not administered)  naloxone (NARCAN) injection 0.4 mg (not administered)    And  sodium chloride flush (NS) 0.9 % injection 9 mL (not administered)  diphenhydrAMINE (BENADRYL) injection 12.5 mg (not administered)    Or  diphenhydrAMINE (BENADRYL) 12.5 MG/5ML elixir 12.5 mg (not administered)    HYDROmorphone (DILAUDID) 1 mg/mL PCA injection ( Intravenous Set-up / Initial Syringe 11/28/15 0339)  lisinopril (PRINIVIL,ZESTRIL) tablet 5 mg (not administered)  MEDLINE mouth rinse (not administered)  sodium chloride flush (NS) 0.9 % injection 10-40 mL (not administered)  HYDROmorphone (DILAUDID) injection 0.5 mg (0.5 mg Subcutaneous Given 11/27/15 2259)  diphenhydrAMINE (BENADRYL) injection 25 mg (25 mg Intravenous Given 11/27/15 2300)  ketorolac (TORADOL) 15 MG/ML injection 15 mg (15 mg Intravenous Given 11/27/15 2300)  HYDROmorphone (DILAUDID) injection 2 mg (2 mg Intravenous Given 11/27/15 2335)    Or  HYDROmorphone (DILAUDID) injection 2 mg ( Subcutaneous See Alternative 11/27/15 2335)  HYDROmorphone (DILAUDID) injection 2 mg (2 mg Intravenous Given 11/28/15 0122)    Or  HYDROmorphone (DILAUDID) injection 2 mg ( Subcutaneous See Alternative 11/28/15 0122)     Initial Impression / Assessment and Plan / ED Course  I have reviewed the triage vital signs and the nursing notes.  Pertinent labs & imaging results that were available during my care of the patient were reviewed by me and considered in my medical decision making (see chart for details).  Clinical Course   This  is a 35 y.o. male who has a PMHx of sickle cell anemia, HTN, PE, acute chest syndrome, aphasia, and hemochromatosis presents to the Emergency Department presenting with a sickle cell pain crisis x 2-3 days. Pt reports having bilateral lateral chest pain. He notes this current pain is mildly different than his normal sickle cell pain due to the pain coming around to his chest. Associated cough noted.  He has been taking dilaudid and MS contin for his sickle cell pain. Pt states he had a stroke two weeks ago and has been placed on Xarelto. Denies hemoptysis, shortness of breath.  On exam the patient is afebrile nontoxic appearing. Lungs clear to auscultation bilaterally. He is resting comfortably in the room. He does  have some mild bilateral pedal edema. No Edema or tenderness. CBC reveals a hemoglobin of 7.6. This is an improvement from his most recent blood work. Chest x-ray without signs of acute chest syndrome. Patient received sickle cell protocol pain medications and after 4 rounds of pain medication he is still complaining of 7 out of 10 pain. Will admit to medicine.  I consulted with Dr. Blaine Hamper who accepted the patient for admission and requested temp orders for tele bed.   Final Clinical Impressions(s) / ED Diagnoses   Final diagnoses:  Sickle cell pain crisis ([place])   I personally performed the services described in this documentation, which was scribed in my presence. The recorded information has been reviewed and is accurate.   }   New Prescriptions Current Discharge Medication List       Waynetta Pean, PA-C 11/28/15 [id number]    April Palumbo, MD 11/28/15 [phone]

## 2015-11-28 ENCOUNTER — Encounter (HOSPITAL_COMMUNITY): Payer: Self-pay | Admitting: *Deleted

## 2015-11-28 ENCOUNTER — Other Ambulatory Visit: Payer: Self-pay | Admitting: Internal Medicine

## 2015-11-28 DIAGNOSIS — Z79899 Other long term (current) drug therapy: Secondary | ICD-10-CM | POA: Diagnosis not present

## 2015-11-28 DIAGNOSIS — I10 Essential (primary) hypertension: Secondary | ICD-10-CM

## 2015-11-28 DIAGNOSIS — I2781 Cor pulmonale (chronic): Secondary | ICD-10-CM | POA: Diagnosis present

## 2015-11-28 DIAGNOSIS — Z87891 Personal history of nicotine dependence: Secondary | ICD-10-CM | POA: Diagnosis not present

## 2015-11-28 DIAGNOSIS — Z86711 Personal history of pulmonary embolism: Secondary | ICD-10-CM | POA: Diagnosis not present

## 2015-11-28 DIAGNOSIS — Z7982 Long term (current) use of aspirin: Secondary | ICD-10-CM | POA: Diagnosis not present

## 2015-11-28 DIAGNOSIS — Z96641 Presence of right artificial hip joint: Secondary | ICD-10-CM | POA: Diagnosis present

## 2015-11-28 DIAGNOSIS — I2721 Secondary pulmonary arterial hypertension: Secondary | ICD-10-CM

## 2015-11-28 DIAGNOSIS — I2699 Other pulmonary embolism without acute cor pulmonale: Secondary | ICD-10-CM | POA: Diagnosis present

## 2015-11-28 DIAGNOSIS — D5700 Hb-SS disease with crisis, unspecified: Principal | ICD-10-CM

## 2015-11-28 DIAGNOSIS — Z7901 Long term (current) use of anticoagulants: Secondary | ICD-10-CM

## 2015-11-28 DIAGNOSIS — J9610 Chronic respiratory failure, unspecified whether with hypoxia or hypercapnia: Secondary | ICD-10-CM | POA: Diagnosis present

## 2015-11-28 DIAGNOSIS — I482 Chronic atrial fibrillation: Secondary | ICD-10-CM

## 2015-11-28 DIAGNOSIS — I480 Paroxysmal atrial fibrillation: Secondary | ICD-10-CM | POA: Diagnosis present

## 2015-11-28 DIAGNOSIS — I69920 Aphasia following unspecified cerebrovascular disease: Secondary | ICD-10-CM | POA: Diagnosis not present

## 2015-11-28 LAB — TROPONIN I: TROPONIN I: 0.01 ng/mL (ref <0.03)

## 2015-11-28 LAB — RAPID URINE DRUG SCREEN, HOSP PERFORMED
Amphetamines: NOT DETECTED
BARBITURATES: NOT DETECTED
Benzodiazepines: NOT DETECTED
Cocaine: NOT DETECTED
Opiates: POSITIVE — AB
Tetrahydrocannabinol: POSITIVE — AB

## 2015-11-28 LAB — BRAIN NATRIURETIC PEPTIDE: B Natriuretic Peptide: 422.5 pg/mL — ABNORMAL HIGH (ref 0.0–100.0)

## 2015-11-28 MED ORDER — AMBRISENTAN 5 MG PO TABS
5.0000 mg | ORAL_TABLET | Freq: Every day | ORAL | Status: DC
Start: 2015-11-28 — End: 2015-11-28
  Filled 2015-11-28: qty 1

## 2015-11-28 MED ORDER — SODIUM CHLORIDE 0.9% FLUSH: 9.0000 mL | INTRAVENOUS | Status: DC | PRN

## 2015-11-28 MED ORDER — ONDANSETRON HCL 4 MG/2ML IJ SOLN: 4.0000 mg | Freq: Three times a day (TID) | INTRAMUSCULAR | Status: DC | PRN

## 2015-11-28 MED ORDER — NALOXONE HCL 0.4 MG/ML IJ SOLN: 0.4000 mg | INTRAMUSCULAR | Status: DC | PRN

## 2015-11-28 MED ORDER — FOLIC ACID 1 MG PO TABS
1.0000 mg | ORAL_TABLET | Freq: Every morning | ORAL | Status: DC
  Administered 2015-11-28: 1 mg via ORAL
  Filled 2015-11-28: qty 1

## 2015-11-28 MED ORDER — LISINOPRIL 5 MG PO TABS: 5.0000 mg | ORAL_TABLET | Freq: Every day | ORAL | Status: DC

## 2015-11-28 MED ORDER — RIVAROXABAN 20 MG PO TABS
20.0000 mg | ORAL_TABLET | Freq: Every day | ORAL | Status: DC
  Administered 2015-11-28: 20 mg via ORAL
  Filled 2015-11-28: qty 1

## 2015-11-28 MED ORDER — ORAL CARE MOUTH RINSE
15.0000 mL | Freq: Two times a day (BID) | OROMUCOSAL | Status: DC
  Administered 2015-11-28: 15 mL via OROMUCOSAL

## 2015-11-28 MED ORDER — GABAPENTIN 300 MG PO CAPS
300.0000 mg | ORAL_CAPSULE | Freq: Three times a day (TID) | ORAL | Status: DC
  Administered 2015-11-28 (×2): 300 mg via ORAL
  Filled 2015-11-28 (×2): qty 1

## 2015-11-28 MED ORDER — HYDROMORPHONE HCL 4 MG PO TABS: 4.0000 mg | ORAL_TABLET | ORAL | 0 refills | Status: AC | PRN

## 2015-11-28 MED ORDER — ASPIRIN 81 MG PO CHEW
81.0000 mg | CHEWABLE_TABLET | Freq: Every day | ORAL | Status: DC
  Administered 2015-11-28: 81 mg via ORAL
  Filled 2015-11-28: qty 1

## 2015-11-28 MED ORDER — SODIUM CHLORIDE 0.9% FLUSH
10.0000 mL | INTRAVENOUS | Status: DC | PRN
  Administered 2015-11-28: 10 mL
  Filled 2015-11-28: qty 40

## 2015-11-28 MED ORDER — DEFERASIROX 360 MG PO TABS: 1080.0000 mg | ORAL_TABLET | Freq: Every day | ORAL | Status: DC

## 2015-11-28 MED ORDER — POLYETHYLENE GLYCOL 3350 17 G PO PACK: 17.0000 g | PACK | Freq: Every day | ORAL | Status: DC | PRN

## 2015-11-28 MED ORDER — HYDROMORPHONE HCL 2 MG/ML IJ SOLN
2.0000 mg | INTRAMUSCULAR | Status: DC | PRN
  Administered 2015-11-28 (×3): 2 mg via INTRAVENOUS
  Filled 2015-11-28 (×3): qty 1

## 2015-11-28 MED ORDER — MORPHINE SULFATE ER 30 MG PO TBCR
30.0000 mg | EXTENDED_RELEASE_TABLET | Freq: Two times a day (BID) | ORAL | Status: DC
  Administered 2015-11-28 (×2): 30 mg via ORAL
  Filled 2015-11-28 (×2): qty 1

## 2015-11-28 MED ORDER — VITAMIN D 1000 UNITS PO TABS
2000.0000 [IU] | ORAL_TABLET | Freq: Every day | ORAL | Status: DC
  Administered 2015-11-28: 2000 [IU] via ORAL
  Filled 2015-11-28: qty 2

## 2015-11-28 MED ORDER — DIPHENHYDRAMINE HCL 12.5 MG/5ML PO ELIX: 12.5000 mg | ORAL_SOLUTION | Freq: Four times a day (QID) | ORAL | Status: DC | PRN

## 2015-11-28 MED ORDER — ZOLPIDEM TARTRATE 10 MG PO TABS: 10.0000 mg | ORAL_TABLET | Freq: Every evening | ORAL | 0 refills | Status: DC | PRN

## 2015-11-28 MED ORDER — HYDROMORPHONE 1 MG/ML IV SOLN
INTRAVENOUS | Status: DC
  Administered 2015-11-28: 18:00:00 via INTRAVENOUS
  Administered 2015-11-28: 4 mg via INTRAVENOUS
  Administered 2015-11-28: 11.1 mg via INTRAVENOUS
  Administered 2015-11-28: 04:00:00 via INTRAVENOUS
  Filled 2015-11-28 (×2): qty 25

## 2015-11-28 MED ORDER — SENNOSIDES-DOCUSATE SODIUM 8.6-50 MG PO TABS
1.0000 | ORAL_TABLET | Freq: Two times a day (BID) | ORAL | Status: DC
  Administered 2015-11-28: 1 via ORAL
  Filled 2015-11-28: qty 1

## 2015-11-28 MED ORDER — MORPHINE SULFATE ER 30 MG PO TBCR: 30.0000 mg | EXTENDED_RELEASE_TABLET | Freq: Two times a day (BID) | ORAL | 0 refills | Status: DC

## 2015-11-28 MED ORDER — LISINOPRIL 10 MG PO TABS
5.0000 mg | ORAL_TABLET | Freq: Every day | ORAL | Status: DC
  Administered 2015-11-28: 5 mg via ORAL
  Filled 2015-11-28: qty 1

## 2015-11-28 MED ORDER — DIPHENHYDRAMINE HCL 50 MG/ML IJ SOLN: 12.5000 mg | Freq: Four times a day (QID) | INTRAMUSCULAR | Status: DC | PRN

## 2015-11-28 MED ORDER — HYDROXYUREA 500 MG PO CAPS
1000.0000 mg | ORAL_CAPSULE | Freq: Every day | ORAL | Status: DC
  Administered 2015-11-28: 1000 mg via ORAL
  Filled 2015-11-28: qty 2

## 2015-11-28 MED ORDER — ZOLPIDEM TARTRATE 10 MG PO TABS: 10.0000 mg | ORAL_TABLET | Freq: Every evening | ORAL | Status: DC | PRN

## 2015-11-28 MED ORDER — HEPARIN SOD (PORK) LOCK FLUSH 100 UNIT/ML IV SOLN
500.0000 [IU] | Freq: Once | INTRAVENOUS | Status: AC
  Administered 2015-11-28: 500 [IU] via INTRAVENOUS

## 2015-11-28 NOTE — H&P (Signed)
History and Physical    [name] [id number] DOB: 07-Feb-1980 DOA: 11/27/2015  Referring MD/NP/PA:   PCP: [name], MD   Patient coming from:  The patient is coming from home.  At baseline, pt is independent for most of ADL.   Chief Complaint: Pain all over  HPI: [name] is a 35 y.o. male with medical history significant of sickle cell disease, stroke with aphasia, acute chest syndrome, DVT, Afib, PE on Xarelto, pulmonary hypertension, cor pulmonale, chronic respiratory failure, C diff colitis, who presents with pain all over.  Patient states that he has worsening pain all over in the past 2 days, particularly in the bilateral sides of his rib cage. It is constant, 10 out of 10 in severity, nonradiating. It is not aggravated or alleviated by any known factors. States that his home pain medication does not help anymore. He also has mild chest pain, but no shortness of breath. He has mild dry cough, no fever or chills.  Patient denies nausea, vomiting, diarrhea, abdominal pain, symptoms of UTI or new unilateral weakness. He has chronic slurred speech and aphasia due to previous stroke.   ED Course: pt was found to have hemoglobin drop from 11.2 on 11/23/15-7.6 today, WBC 10.3, electrolytes and renal function okay, temperature normal, no tachycardia, no tachypnea, and saturation 87% to 93% on room air. Patient is admitted to telemetry bed as inpatient.  Review of Systems:   General: no fevers, chills, no changes in body weight, has poor appetite, has fatigue HEENT: no blurry vision, hearing changes or sore throat Respiratory: no dyspnea, coughing, wheezing CV: has mild chest pain, no palpitations GI: no nausea, vomiting, abdominal pain, diarrhea, constipation GU: no dysuria, burning on urination, increased urinary frequency, hematuria  Ext: no leg edema Neuro: no unilateral weakness, numbness, or tingling, no vision change or hearing loss Skin: no rash, no  skin tear. MSK: has pain all over Heme: No easy bruising.  Travel history: No recent long distant travel.  Allergy: No Known Allergies  Past Medical History:  Diagnosis Date  . Acute chest syndrome ([place]) 06/18/2013  . Acute embolism and thrombosis of right internal jugular vein ([place])   . Alcohol consumption of one to four drinks per day   . Avascular necrosis (HCC)    Right Hip  . Blood transfusion   . Chronic anticoagulation   . Demand ischemia ([place]) 01/02/2014  . Former smoker   . Functional asplenia   . Hb-SS disease with crisis ([place])   . History of Clostridium difficile infection   . History of pulmonary embolus (PE)   . Hypertension   . Hypokalemia   . Leukocytosis    Chronic  . Mood disorder ([place])   . Noncompliance with medication regimen   . Oxygen deficiency   . Pulmonary hypertension   . Second hand tobacco smoke exposure   . Sickle cell anemia (HCC)   . Sickle-cell crisis with associated acute chest syndrome ([place]) 05/13/2013  . Thrombocytosis (HCC)    Chronic  . Uses marijuana     Past Surgical History:  Procedure Laterality Date  . CHOLECYSTECTOMY     01/2008  . Excision of left periauricular cyst     10/2009  . Excision of right ear lobe cyst with primary closur     11/2007  . Porta cath placement    . Porta cath removal    . PORTACATH PLACEMENT  01/05/2012   Procedure: INSERTION PORT-A-CATH;  Surgeon: Odis Hollingshead,  MD;  Location: [facility];  Service: General;  Laterality: N/A;  ultrasound guiced port a cath insertion with fluoroscopy  . Right hip replacement     08/2006  . UMBILICAL HERNIA REPAIR     01/2008    Social History:  reports that he quit smoking about 4 years ago. His smoking use included Cigarettes. He has a 5.00 pack-year smoking history. He has never used smokeless tobacco. He reports that he does not drink alcohol or use drugs.  Family History:  Family History  Problem Relation Age of Onset  . Sickle cell trait Mother   . Depression  Mother   . Diabetes Mother   . Sickle cell trait Father   . Sickle cell trait Brother      Prior to Admission medications   Medication Sig Start Date End Date Taking? Authorizing Provider  ambrisentan (LETAIRIS) 5 MG tablet Take 5 mg by mouth at bedtime.   Yes Historical Provider, MD  aspirin 81 MG chewable tablet Chew 1 tablet (81 mg total) by mouth daily. 07/23/14  Yes [name], MD  Cholecalciferol (VITAMIN D) 2000 units tablet Take 1 tablet (2,000 Units total) by mouth daily. 02/23/15  Yes Dorena Dew, FNP  Deferasirox (JADENU) 360 MG TABS Take 1,080 mg by mouth daily.    Yes Historical Provider, MD  folic acid (FOLVITE) 1 MG tablet Take 1 tablet (1 mg total) by mouth every morning. 02/23/15  Yes Dorena Dew, FNP  gabapentin (NEURONTIN) 300 MG capsule Take 1 capsule (300 mg total) by mouth 3 (three) times daily. 06/01/15  Yes Tresa Garter, MD  HYDROmorphone (DILAUDID) 4 MG tablet Take 1 tablet (4 mg total) by mouth every 4 (four) hours as needed for severe pain. 11/06/15  Yes Tresa Garter, MD  hydroxyurea (HYDREA) 500 MG capsule Take 1,000 mg by mouth daily. May take with food to minimize GI side effects.   Yes Historical Provider, MD  lisinopril (PRINIVIL,ZESTRIL) 5 MG tablet Take 1 tablet (5 mg total) by mouth daily. 08/04/15  Yes [name], MD  morphine (MS CONTIN) 30 MG 12 hr tablet Take 1 tablet (30 mg total) by mouth every 12 (twelve) hours. 11/06/15  Yes Tresa Garter, MD  rivaroxaban (XARELTO) 20 MG TABS tablet Take 20 mg by mouth daily.    Yes Historical Provider, MD  torsemide (DEMADEX) 20 MG tablet Take 20 mg by mouth daily as needed. For increase in patient weight greater than 3 lbs. 07/27/15 07/26/16 Yes Historical Provider, MD  zolpidem (AMBIEN) 10 MG tablet Take 1 tablet (10 mg total) by mouth at bedtime as needed for sleep. 11/07/15 12/07/15 Yes Tresa Garter, MD    Physical Exam: Vitals:   11/28/15 0123 11/28/15 0200 11/28/15  0215 11/28/15 0230  BP: 96/64 101/65  98/63  Pulse: 79 76 62 73  Resp: 18 18    Temp:      TempSrc:      SpO2: 92% 96% 96% 97%  Weight:      Height:       General: Not in acute distress HEENT:       Eyes: PERRL, EOMI, no scleral icterus.       ENT: No discharge from the ears and nose, no pharynx injection, no tonsillar enlargement.        Neck: No JVD, no bruit, no mass felt. Heme: No neck lymph node enlargement. Cardiac: S1/S2, RRR, No murmurs, No gallops or rubs. Respiratory: No rales,  wheezing, rhonchi or rubs. GI: Soft, nondistended, nontender, no rebound pain, no organomegaly, BS present. GU: No hematuria Ext: No pitting leg edema bilaterally. 2+DP/PT pulse bilaterally. Musculoskeletal: No joint deformities, No joint redness or warmth. Has diffused tenderness, worse in bilateral rib cages Skin: No rashes.  Neuro: Alert, oriented X3, has aphasia, moves all extremities normally. Psych: Patient is not psychotic, no suicidal or hemocidal ideation.  Labs on Admission: I have personally reviewed following labs and imaging studies  CBC:  Recent Labs Lab 11/23/15 0952 11/27/15 2252  WBC 14.9* 11.3*  NEUTROABS 11.2* 7.4  HGB 6.9* 7.6*  HCT 21.6* 23.3*  MCV 91.9 90.7  PLT 610* 0000000*   Basic Metabolic Panel:  Recent Labs Lab 11/22/15 0803 11/23/15 0952 11/27/15 2252  NA 138 139 140  K 3.5 4.0 3.6  CL 109 110 109  CO2 24 24 26   GLUCOSE 109* 128* 118*  BUN 11 21* 13  CREATININE 0.79 1.33* 0.84  CALCIUM 8.8* 8.6* 9.0   GFR: Estimated Creatinine Clearance: 132.6 mL/min (by C-G formula based on SCr of 0.84 mg/dL). Liver Function Tests:  Recent Labs Lab 11/23/15 0952 11/27/15 2252  AST 26 38  ALT 17 28  ALKPHOS 73 77  BILITOT 3.5* 4.5*  PROT 7.1 7.5  ALBUMIN 4.0 4.3   No results for input(s): LIPASE, AMYLASE in the last 168 hours. No results for input(s): AMMONIA in the last 168 hours. Coagulation Profile:  Recent Labs Lab 11/22/15 0803  INR 1.70    Cardiac Enzymes: No results for input(s): CKTOTAL, CKMB, CKMBINDEX, TROPONINI in the last 168 hours. BNP (last 3 results) No results for input(s): PROBNP in the last 8760 hours. HbA1C: No results for input(s): HGBA1C in the last 72 hours. CBG: No results for input(s): GLUCAP in the last 168 hours. Lipid Profile: No results for input(s): CHOL, HDL, LDLCALC, TRIG, CHOLHDL, LDLDIRECT in the last 72 hours. Thyroid Function Tests: No results for input(s): TSH, T4TOTAL, FREET4, T3FREE, THYROIDAB in the last 72 hours. Anemia Panel:  Recent Labs  11/27/15 2252  RETICCTPCT 5.4*   Urine analysis:    Component Value Date/Time   COLORURINE YELLOW 11/22/2015 [place] 11/22/2015 0718   LABSPEC 1.013 11/22/2015 0718   PHURINE 6.5 11/22/2015 0718   GLUCOSEU NEGATIVE 11/22/2015 0718   HGBUR NEGATIVE 11/22/2015 0718   BILIRUBINUR NEGATIVE 11/22/2015 0718   KETONESUR NEGATIVE 11/22/2015 0718   PROTEINUR NEGATIVE 11/22/2015 0718   UROBILINOGEN 0.2 03/24/2015 1426   NITRITE NEGATIVE 11/22/2015 0718   LEUKOCYTESUR NEGATIVE 11/22/2015 0718   Sepsis Labs: @LABRCNTIP (procalcitonin:4,lacticidven:4) )No results found for this or any previous visit (from the past 240 hour(s)).   Radiological Exams on Admission: Dg Chest 2 View  Result Date: 11/27/2015 CLINICAL DATA:  Sickle cell disease.  Chest pain for 2-3 days. EXAM: CHEST  2 VIEW COMPARISON:  Chest radiograph 11/23/2015 FINDINGS: There is a left chest wall Port-A-Cath with the tip in the proximal right atrium, unchanged. Unchanged cardiomegaly. No pulmonary edema. No focal airspace consolidation. No pleural effusion or pneumothorax. Unchanged hazy opacities and nodular density of the right mid lung. IMPRESSION: Unchanged cardiomegaly without overt pulmonary edema. Unchanged hazy opacities in the right mid lung. Electronically Signed   By: Ulyses Jarred M.D.   On: 11/27/2015 23:05     EKG: Independently reviewed.  Sinus  rhythm, QTC 487, LAD, T-wave inversion in V2-V6, which is similar to previous EKG on 11/10/15   Assessment/Plan Principal Problem:   Hb-SS disease with crisis (  [place]) Active Problems:   Hx of pulmonary embolus   Chronic anticoagulation   Essential hypertension   Pulmonary embolus (HCC)   PAH (pulmonary artery hypertension)   Sickle cell anemia (HCC)   Cor pulmonale, chronic (HCC)   Chronic atrial fibrillation (HCC)   Sickle cell pain crisis ([place])   Slurred speech   Aphasia   Ischemic stroke (HCC)   Sickle cell pain crisis ([place]): No acute chest syndrome clinically. CXR has no change. hemoglobin drop from 11.2 on 11/23/15-->7.6, but no indication to blood transfusion nwo.  -Admit to tele bed as inpt -continue folic acid and hydroxyurea -continue Jadenu for secondary Hemochromatosis -IVF: D5-1/2NS at 125 cc/h -Zofran for nausea -Start high dose PCA protocol for pain: loading dose 1.5 mg, bolus dose 0.8 mg, lockout interval 10 min and one hour dose limit at 4.8 mg. - continue home MS Contin - will not start Toradol since pt is on Xarelto  Chronic respiratory failure secondary to pulmonary hypertension and Cor pulmonale: pt has no SOB. X-rays negative for infiltration. -prn Nasal cannula oxygen to maintain oxygen saturation -hold torsemide since patient needs IV fluid for sickle cell pain crisis -continue letairis -check BNP  Hx of pulmonary embolus and DVT: -continue Xarelto  PAF: CHA2DS2-VASc Score is 3, needs oral anticoagulation. Patient is on Xarelto at home. Heart rate is well controlled without using BB or CCB -continue Xarelto -tele monitoring   Hx of stroke: has aphasia. -on Xarelto for a fib -continue ASA   DVT ppx: On Xarelto Code Status: Full code Family Communication: None at bed side.  Disposition Plan:  Anticipate discharge back to previous home environment Consults called:  none Admission status:   Inpatient/tele  Date of Service 11/28/2015     Ivor Costa [facility] Pager [phone]  If 7PM-7AM, please contact night-coverage [email or web address] Password [email or web address] 11/28/2015, 2:54 AM

## 2015-11-28 NOTE — Discharge Summary (Signed)
Physician Discharge Summary  [name] [id number] DOB: 1980-09-07 DOA: 11/27/2015  PCP: [name], MD  Admit date: 11/27/2015  Discharge date: 11/28/2015  Discharge Diagnoses:  Principal Problem:   Hb-SS disease with crisis ([place]) Active Problems:   Hx of pulmonary embolus   Chronic anticoagulation   Essential hypertension   Pulmonary embolus (HCC)   PAH (pulmonary artery hypertension)   Sickle cell anemia (HCC)   Cor pulmonale, chronic (HCC)   Chronic atrial fibrillation (HCC)   Sickle cell pain crisis ([place])   Slurred speech   Aphasia   Ischemic stroke ([place])   Discharge Condition: Stable  Disposition:  Follow-up Information    Jonus Coble, MD Follow up in 1 week(s).   Specialty:  Internal Medicine Contact information: [place] [id number] [phone]           Diet: Regular  Wt Readings from Last 3 Encounters:  11/28/15 79.4 kg (175 lb 0.7 oz)  11/23/15 77.1 kg (170 lb)  11/23/15 77.1 kg (170 lb)    History of present illness:  [name] is a 35 y.o. male with medical history significant of sickle cell disease, stroke with aphasia, acute chest syndrome, DVT, Afib, PE on Xarelto, pulmonary hypertension, cor pulmonale, chronic respiratory failure, C diff colitis, who presents with pain all over. Patient states that he has worsening pain all over in the past 2 days, particularly in the bilateral sides of his rib cage. It is constant, 10 out of 10 in severity, nonradiating. It is not aggravated or alleviated by any known factors. States that his home pain medication does not help anymore. He also has mild chest pain, but no shortness of breath. He has mild dry cough, no fever or chills. Patient denies nausea, vomiting, diarrhea, abdominal pain, symptoms of UTI or new unilateral weakness. He has chronic slurred speech and aphasia due to previous stroke.   Hospital Course:  Patient was treated with standard  sickle cell pain crisis protocol with significant improvement. Patient down to baseline, no SOB, no fever, no chest pain, denies any unusual symptom. She requested pain medication prescription since he will soon run out and will need refill. He was evaluated and determined fit for discharge. He was discharged home in a hemodynamically stable condition. He will continue all his home medication, follow up with me in the clinic within one week of this discharge and follow up with Hematologist as scheduled.  Discharge Exam: Vitals:   11/28/15 1338 11/28/15 1600  BP: 106/69   Pulse: 73   Resp: 15 14  Temp: 98.8 F (37.1 C)    Vitals:   11/28/15 1123 11/28/15 1129 11/28/15 1338 11/28/15 1600  BP: 104/67  106/69   Pulse: 65  73   Resp: 14 13 15 14   Temp: 98.7 F (37.1 C)  98.8 F (37.1 C)   TempSrc: Oral  Oral   SpO2: 96% 95% 97% 96%  Weight:      Height:       General appearance : Awake, alert, not in any distress. Speech is slightly slurred but comprehensible. Not toxic looking HEENT: Atraumatic and Normocephalic, pupils equally reactive to light and accomodation Neck: Supple, no JVD. No cervical lymphadenopathy.  Chest: Good air entry bilaterally, no added sounds  CVS: S1 S2 regular, systolic murmurs 3/6 and loud P2.  Abdomen: Bowel sounds present, Non tender and not distended with no gaurding, rigidity or rebound. Extremities: B/L Lower Ext shows no edema, both legs  are warm to touch Neurology: Awake alert, and oriented X 3, CN II-XII intact, Non focal Skin: No Rash  Discharge Instructions    Medication List    TAKE these medications   ambrisentan 5 MG tablet Commonly known as:  LETAIRIS Take 5 mg by mouth at bedtime.   aspirin 81 MG chewable tablet Chew 1 tablet (81 mg total) by mouth daily.   folic acid 1 MG tablet Commonly known as:  FOLVITE Take 1 tablet (1 mg total) by mouth every morning.   gabapentin 300 MG capsule Commonly known as:  NEURONTIN Take 1 capsule  (300 mg total) by mouth 3 (three) times daily.   HYDROmorphone 4 MG tablet Commonly known as:  DILAUDID Take 1 tablet (4 mg total) by mouth every 4 (four) hours as needed for severe pain.   hydroxyurea 500 MG capsule Commonly known as:  HYDREA Take 1,000 mg by mouth daily. May take with food to minimize GI side effects.   JADENU 360 MG Tabs Generic drug:  Deferasirox Take 1,080 mg by mouth daily.   lisinopril 5 MG tablet Commonly known as:  PRINIVIL,ZESTRIL Take 1 tablet (5 mg total) by mouth daily.   morphine 30 MG 12 hr tablet Commonly known as:  MS CONTIN Take 1 tablet (30 mg total) by mouth every 12 (twelve) hours. Start taking on:  12/06/2015   rivaroxaban 20 MG Tabs tablet Commonly known as:  XARELTO Take 20 mg by mouth daily.   torsemide 20 MG tablet Commonly known as:  DEMADEX Take 20 mg by mouth daily as needed. For increase in patient weight greater than 3 lbs.   Vitamin D 2000 units tablet Take 1 tablet (2,000 Units total) by mouth daily.   zolpidem 10 MG tablet Commonly known as:  AMBIEN Take 1 tablet (10 mg total) by mouth at bedtime as needed for sleep. Start taking on:  12/06/2015      The results of significant diagnostics from this hospitalization (including imaging, microbiology, ancillary and laboratory) are listed below for reference.    Significant Diagnostic Studies: Dg Chest 2 View  Result Date: 11/27/2015 CLINICAL DATA:  Sickle cell disease.  Chest pain for 2-3 days. EXAM: CHEST  2 VIEW COMPARISON:  Chest radiograph 11/23/2015 FINDINGS: There is a left chest wall Port-A-Cath with the tip in the proximal right atrium, unchanged. Unchanged cardiomegaly. No pulmonary edema. No focal airspace consolidation. No pleural effusion or pneumothorax. Unchanged hazy opacities and nodular density of the right mid lung. IMPRESSION: Unchanged cardiomegaly without overt pulmonary edema. Unchanged hazy opacities in the right mid lung. Electronically Signed   By:  Ulyses Jarred M.D.   On: 11/27/2015 23:05   Dg Chest 2 View  Result Date: 11/23/2015 CLINICAL DATA:  Bilateral side pain for 1 week with hypoxia. EXAM: CHEST  2 VIEW COMPARISON:  11/22/2015 FINDINGS: The lungs are clear wiithout focal pneumonia, edema, pneumothorax or pleural effusion. The cardio pericardial silhouette is enlarged. There is pulmonary vascular congestion without overt pulmonary edema. Nodular areas in the peripheral aspect of the right mid lung were seen on CT scan from 2 weeks ago. Left Port-A-Cath tip projects at the SVC/RA junction. Telemetry leads overlie the chest. IMPRESSION: Cardiomegaly with pulmonary vascular congestion. Nodular density right mid lung better seen on recent CT scan. Electronically Signed   By: Misty Stanley M.D.   On: 11/23/2015 10:34   Ct Head Wo Contrast  Result Date: 11/10/2015 CLINICAL DATA:  Left-sided weakness. EXAM: CT HEAD WITHOUT CONTRAST TECHNIQUE:  Contiguous axial images were obtained from the base of the skull through the vertex without intravenous contrast. COMPARISON:  None. FINDINGS: Brain: No evidence of acute infarction, hemorrhage, extra-axial collection, ventriculomegaly, or mass effect. Old left basal ganglia 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 Generalized cerebral atrophy. Periventricular white matter low attenuation likely secondary to microangiopathy. Vascular: No significant cerebral vascular atherosclerotic calcifications are noted. Skull: Negative for fracture or focal lesion. Sinuses/Orbits: Visualized portions of the orbits are unremarkable. Visualized portions of the paranasal sinuses and mastoid air cells are unremarkable. Other: None. IMPRESSION: 1. No acute intracranial pathology. Electronically Signed   By: Kathreen Devoid   On: 11/10/2015 11:16   Ct Angio Chest Pe W/cm &/or Wo Cm  Result Date: 11/10/2015 CLINICAL DATA:  Left flank pain and lethargy. Difficulty walking and facial droop with slurred speech. Hypoxia.  Shortness of breath. Sickle cell anemia. EXAM: CT ANGIOGRAPHY CHEST WITH CONTRAST TECHNIQUE: Multidetector CT imaging of the chest was performed using the standard protocol during bolus administration of intravenous contrast. Multiplanar CT image reconstructions and MIPs were obtained to evaluate the vascular anatomy. CONTRAST:  65 cc Isovue 370 COMPARISON:  Multiple exams, including 11/10/2015 chest radiograph, and CT of 11/30/2014 FINDINGS: Cardiovascular: Filling defect compatible with acute pulmonary embolus in the right middle lobe, right lower lobe, and potentially left lower lobe signal told pulmonary vessels. Marked cardiomegaly with elongation of the heart, RV:LV ratio 1.5 (abnormally elevated). Small pericardial effusion. Enlarged main pulmonary artery at 4.5 cm transverse. Left Port-A-Cath noted, tip a in the right atrium. Mediastinum/Nodes: Mass or confluent nodularity in the anterior mediastinum including a 1.7 by 4.5 cm anterior mediastinal component on image 50/4, confluent lymph nodes were also present previously but somewhat less striking. Right hilar node 1.3 cm in short axis on image 57/4, previously 0.6 cm. A posterior right hilar node measures 1.4 cm in short axis on image 64/4, formerly 0.8 cm. Lungs/Pleura: Peripheral foraminal airspace opacity at the right lung apex is new possibly from pulmonary hemorrhage. There is bilateral primarily perihilar ground-glass opacities and patchy sub solid opacities in both lower lobes and to a lesser extent peripherally in the upper lobes. Likely a combination of edema and pulmonary hemorrhage along with scattered atelectasis. No significant pleural effusion. Upper Abdomen: Diffuse calcification of the spleen, similar to prior. 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 Endplate notching of the thoracic vertebra characteristic of sickle cell disease. Review of the MIP images confirms the above findings.  IMPRESSION: 1. Acute pulmonary embolus is present in segmental branches of the right middle lobe and right lower lobe, and possibly the left lower lobe on today's exam. Positive for acute PE with CT evidence of right heart strain (RV/LV Ratio = 1.5) consistent with at least submassive (intermediate risk) PE. The presence of right heart strain has been associated with an increased risk of morbidity and mortality. Please activate Code PE by paging [phone]. 2. Stable marked cardiomegaly. 3. Small pericardial effusion. 4. Enlarged main pulmonary artery at 4.5 cm transverse. 5. Patchy ground-glass and peripheral opacities in both lungs likely a combination of mild pulmonary edema, pulmonary hemorrhage, and scattered atelectasis. 6. Osseous findings of sickle cell disease. 7. There are enlarged mediastinal and right hilar lymph nodes, increased from prior. These may merit follow up in the nonacute setting to ensure resolution and exclude the possibility of entities such as lymphoproliferative disease. Critical Value/emergent results were called by telephone at the time of interpretation on 11/10/2015 at 1:06 pm to  Dr. Threasa Beards BELFI , who verbally acknowledged these results. Electronically Signed   By: Van Clines M.D.   On: 11/10/2015 13:08   Mr Brain Wo Contrast  Result Date: 11/10/2015 CLINICAL DATA:  Sickle cell disease. Gait disturbance and left facial droop. EXAM: MRI HEAD WITHOUT CONTRAST TECHNIQUE: Multiplanar, multiecho pulse sequences of the brain and surrounding structures were obtained without intravenous contrast. COMPARISON:  Head CT same day. FINDINGS: Brain: There are scattered acute infarctions. Small foci of acute infarction are seen within the left superior cerebellum and left posterior pons/midbrain junction. There is acute infarction within the left occipital cortex measuring about 2 in diameter. There is a small acute infarction within the left corpus callosum more cingulate gyrus. A  few punctate acute infarctions are seen in the left frontoparietal cortex. No acute infarction affecting the right hemisphere. There are multiple old bilateral cerebellar infarctions. There are old small vessel infarctions affecting the thalami I and basal ganglia left more than right. No mass lesion, hemorrhage, hydrocephalus or extra-axial collection. Vascular: Major vessels at the base of the brain show flow. Skull and upper cervical spine: Negative except for chronic marrow changes of sickle cell. Sinuses/Orbits: Chronic marrow changes of sickle cell. No inflammatory sinus disease. Orbits negative. Other: None significant IMPRESSION: Background pattern of old infarctions throughout the cerebellum and old lacunar infarctions affecting the thalami basal ganglia. Multiple acute infarctions including areas of involvement in the left cerebellum, left dorsal pons midbrain junction, left occipital lobe, the left corpus callosum or cingulate gyrus, and a few small punctate foci in the left frontoparietal cortex. Findings are most consistent with embolic disease from the heart or ascending aorta given the combination of anterior and posterior circulation infarctions. Electronically Signed   By: Nelson Chimes M.D.   On: 11/10/2015 16:33   Dg Chest Port 1 View  Result Date: 11/22/2015 CLINICAL DATA:  Sickle-cell crisis, acute chest syndrome EXAM: PORTABLE CHEST 1 VIEW COMPARISON:  11/10/2015 FINDINGS: Marked cardiomegaly with mild vascular congestion and diffuse interstitial prominence. Basilar peripheral Kerley B-lines noted suspicious for mild basilar interstitial edema pattern. There is associated increased bibasilar atelectasis. No large effusion or pneumothorax. Trachea is midline. Left subclavian power port catheter tip at the SVC RA junction. IMPRESSION: Marked cardiomegaly with mild basilar interstitial edema pattern and increased basilar atelectasis. Electronically Signed   By: Jerilynn Mages.  Shick M.D.   On: 11/22/2015  08:40   Dg Chest Port 1 View  Result Date: 11/10/2015 CLINICAL DATA:  History of sickle cell disease, now with shortness of breath and hypoxia. EXAM: PORTABLE CHEST 1 VIEW COMPARISON:  10/28/2015; 09/26/2015; 08/07/2015 FINDINGS: Similar findings of enlarged cardiac silhouette and mediastinal contours given reduced lung volumes. Pulmonary vasculature is indistinct with cephalization of flow. Grossly unchanged bilateral infrahilar heterogeneous opacities favored to represent atelectasis. Stable position of support apparatus. Trace pleural effusions are not excluded. No pneumothorax. 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 IMPRESSION: 1. Findings worrisome for pulmonary edema on this AP portable examination. Note, atypical infection could have a similar appearance. Further evaluation with a PA and lateral chest radiograph may be obtained as clinically indicated. 2. Similar findings of cardiomegaly. Electronically Signed   By: Sandi Mariscal M.D.   On: 11/10/2015 10:49    Microbiology: No results found for this or any previous visit (from the past 240 hour(s)).   Labs: Basic Metabolic Panel:  Recent Labs Lab 11/22/15 0803 11/23/15 0952 11/27/15 2252  NA 138 139 140  K 3.5  4.0 3.6  CL 109 110 109  CO2 24 24 26   GLUCOSE 109* 128* 118*  BUN 11 21* 13  CREATININE 0.79 1.33* 0.84  CALCIUM 8.8* 8.6* 9.0   Liver Function Tests:  Recent Labs Lab 11/23/15 0952 11/27/15 2252  AST 26 38  ALT 17 28  ALKPHOS 73 77  BILITOT 3.5* 4.5*  PROT 7.1 7.5  ALBUMIN 4.0 4.3   No results for input(s): LIPASE, AMYLASE in the last 168 hours. No results for input(s): AMMONIA in the last 168 hours. CBC:  Recent Labs Lab 11/23/15 0952 11/27/15 2252  WBC 14.9* 11.3*  NEUTROABS 11.2* 7.4  HGB 6.9* 7.6*  HCT 21.6* 23.3*  MCV 91.9 90.7  PLT 610* 620*   Cardiac Enzymes:  Recent Labs Lab 11/28/15 0307 11/28/15 0940 11/28/15 1438  TROPONINI  <0.03 0.01 <0.03   BNP: Invalid input(s): POCBNP CBG: No results for input(s): GLUCAP in the last 168 hours.  Time coordinating discharge: 50 minutes  Signed:  Casie Sturgeon, [facility] 11/28/2015, 6:21 PM

## 2015-11-28 NOTE — ED Notes (Signed)
Dr. Niu, hospitalist, at bedside. 

## 2015-11-28 NOTE — Discharge Instructions (Signed)
Sickle Cell Anemia, Adult °Sickle cell anemia is a condition in which red blood cells have an abnormal "sickle" shape. This abnormal shape shortens the cells' life span, which results in a lower than normal concentration of red blood cells in the blood. The sickle shape also causes the cells to clump together and block free blood flow through the blood vessels. As a result, the tissues and organs of the body do not receive enough oxygen. Sickle cell anemia causes organ damage and pain and increases the risk of infection. °CAUSES  °Sickle cell anemia is a genetic disorder. Those who receive two copies of the gene have the condition, and those who receive one copy have the trait. °RISK FACTORS °The sickle cell gene is most common in people whose families originated in [place]. Other areas of the globe where sickle cell trait occurs include the Mediterranean, [place] and [place], the [place], and the [place].  °SIGNS AND SYMPTOMS °· Pain, especially in the extremities, back, chest, or abdomen (common). The pain may start suddenly or may develop following an illness, especially if there is dehydration. Pain can also occur due to overexertion or exposure to extreme temperature changes. °· Frequent severe bacterial infections, especially certain types of pneumonia and meningitis. °· Pain and swelling in the hands and feet. °· Decreased activity.   °· Loss of appetite.   °· Change in behavior. °· Headaches. °· Seizures. °· Shortness of breath or difficulty breathing. °· Vision changes. °· Skin ulcers. °Those with the trait may not have symptoms or they may have mild symptoms.  °DIAGNOSIS  °Sickle cell anemia is diagnosed with blood tests that demonstrate the genetic trait. It is often diagnosed during the newborn period, due to mandatory testing nationwide. A variety of blood tests, X-rays, CT scans, MRI scans, ultrasounds, and lung function tests may also be done to monitor the condition. °TREATMENT  °Sickle  cell anemia may be treated with: °· Medicines. You may be given pain medicines, antibiotic medicines (to treat and prevent infections) or medicines to increase the production of certain types of hemoglobin. °· Fluids. °· Oxygen. °· Blood transfusions. °HOME CARE INSTRUCTIONS  °· Drink enough fluid to keep your urine clear or pale yellow. Increase your fluid intake in hot weather and during exercise. °· Do not smoke. Smoking lowers oxygen levels in the blood.   °· Only take over-the-counter or prescription medicines for pain, fever, or discomfort as directed by your health care provider. °· Take antibiotics as directed by your health care provider. Make sure you finish them it even if you start to feel better.   °· Take supplements as directed by your health care provider.   °· Consider wearing a medical alert bracelet. This tells anyone caring for you in an emergency of your condition.   °· When traveling, keep your medical information, health care provider's names, and the medicines you take with you at all times.   °· If you develop a fever, do not take medicines to reduce the fever right away. This could cover up a problem that is developing. Notify your health care provider. °· Keep all follow-up appointments with your health care provider. Sickle cell anemia requires regular medical care. °SEEK MEDICAL CARE IF: ° You have a fever. °SEEK IMMEDIATE MEDICAL CARE IF:  °· You feel dizzy or faint.   °· You have new abdominal pain, especially on the left side near the stomach area.   °· You develop a persistent, often uncomfortable and painful penile erection (priapism). If this is not treated immediately it   will lead to impotence.   You have numbness your arms or legs or you have a hard time moving them.   You have a hard time with speech.   You have a fever or persistent symptoms for more than 2-3 days.   You have a fever and your symptoms suddenly get worse.   You have signs or symptoms of infection.  These include:   Chills.   Abnormal tiredness (lethargy).   Irritability.   Poor eating.   Vomiting.   You develop pain that is not helped with medicine.   You develop shortness of breath.  You have pain in your chest.   You are coughing up pus-like or bloody sputum.   You develop a stiff neck.  Your feet or hands swell or have pain.  Your abdomen appears bloated.  You develop joint pain. MAKE SURE YOU:  Understand these instructions.   This information is not intended to replace advice given to you by your health care provider. Make sure you discuss any questions you have with your health care provider.   Document Released: 04/27/2005 Document Revised: 02/07/2014 Document Reviewed: 08/29/2012 Elsevier Interactive Patient Education 2016 [place]. Oxygen Use at Home Oxygen can be prescribed for home use. The prescription will show the flow rate. This is how much oxygen is to be used per minute. This will be listed in liters per minute (LPM or L/M). A liter is a metric measurement of volume. You will use oxygen therapy as directed. It can be used while exercising, sleeping, or at rest. You may need oxygen continuously. Your health care provider may order a blood oxygen test (arterial blood gas or pulse oximetry test) that will show what your oxygen level is. Your health care provider will use these measurements to learn about your needs and follow your progress. Home oxygen therapy is commonly used on patients with various lung (pulmonary) related conditions. Some of these conditions include:  Asthma.  Lung cancer.  Pneumonia.  Emphysema.  Chronic bronchitis.  Cystic fibrosis.  Other lung diseases.  Pulmonary fibrosis.  Occupational lung disease.  Heart failure.  Chronic obstructive pulmonary disease (COPD). 3 COMMON WAYS OF PROVIDING OXYGEN THERAPY  Gas: The gas form of oxygen is put into variously sized cylinders or tanks. The cylinders or  oxygen tanks contain compressed oxygen. The cylinder is equipped with a regulator that controls the flow rate. Because the flow of oxygen out of the cylinder is constant, an oxygen conserving device may be attached to the system to avoid waste. This device releases the gas only when you inhale and cuts it off when you exhale. Oxygen can be provided in a small cylinder that can be carried with you. Large tanks are heavy and are only for stationary use. After use, empty tanks must be exchanged for full tanks.  Liquid: The liquid form of oxygen is put into a container similar to a thermos. When released, the liquid converts to a gas and you breathe it in just like the compressed gas. This storage method takes up less space than the compressed gas cylinder, and you can transfer the liquid to a small, portable vessel at home. Liquid oxygen is more expensive than the compressed gas, and the vessel vents when not in use. An oxygen conserving device may be built into the vessel to conserve the oxygen. Liquid oxygen is very cold, around 297 below zero.  Oxygen concentrator: This medical device filters oxygen from room air and gives almost 100%  oxygen to the patient. Oxygen concentrators are powered by electricity. Benefits of this system are:  It does not need to be resupplied.  It is not as costly as liquid oxygen.  Extra tubing permits the user to move around easier. There are several types of small, portable oxygen systems available which can help you remain active and mobile. You must have a cylinder of oxygen as a backup in the event of a power failure. Advise your electric power company that you are on oxygen therapy in order to get priority service when there is a power failure. OXYGEN DELIVERY DEVICES There are 3 common ways to deliver oxygen to your body.  Nasal cannula. This is a 2-pronged device inserted in the nostrils that is connected to tubing carrying the oxygen. The tubing can rest on the  ears or be attached to the frame of eyeglasses.  Mask. People who need a high flow of oxygen generally use a mask.  Transtracheal catheter. Transtracheal oxygen therapy requires the insertion of a small, flexible tube (catheter) in the windpipe (trachea). This catheter is held in place by a necklace. Since transtracheal oxygen bypasses the mouth, nose, and throat, a humidifier is absolutely required at flow rates of 1 LPM or greater. OXYGEN USE SAFETY TIPS  Never smoke while using oxygen. Oxygen does not burn or explode, but flammable materials will burn faster in the presence of oxygen.  Keep a Data processing manager close by. Let your fire department know that you have oxygen in your home.  Warn visitors not to smoke near you when you are using oxygen. Put up "no smoking" signs in your home where you most often use the oxygen.  When you go to a restaurant with your portable oxygen source, ask to be seated in the nonsmoking section.  Stay at least 5 feet away from gas stoves, candles, lighted fireplaces, or other heat sources.  Do not use materials that burn easily (flammable) while using your oxygen.  If you use an oxygen cylinder, make sure it is secured to some fixed object or in a stand. If you use liquid oxygen, make sure the vessel is kept upright to keep the oxygen from pouring out. Liquid oxygen is so cold it can hurt your skin.  If you use an oxygen concentrator, call your electric company so you will be given priority service if your power goes out. Avoid using extension cords, if possible.  Regularly test your smoke detectors at home to make sure they work. If you receive care in your home from a nurse or other health care provider, he or she may also check to make sure your smoke detectors work. GUIDELINES FOR CLEANING YOUR EQUIPMENT  Wash the nasal prongs with a liquid soap. Thoroughly rinse them once or twice a week.  Replace the prongs every 2 to 4 weeks. If you have an  infection (cold, pneumonia) change them when you are well.  Your health care provider will give you instructions on how to clean your transtracheal catheter.  The humidifier bottle should be washed with soap and warm water and rinsed thoroughly between each refill. Air-dry the bottle before filling it with sterile or distilled water. The bottle and its top should be disinfected after they are cleaned.  If you use an oxygen concentrator, unplug the unit. Then wipe down the cabinet with a damp cloth and dry it daily. The air filter should be cleaned at least twice a week.  Follow your home medical equipment and  service company's directions for cleaning the compressor filter. HOME CARE INSTRUCTIONS   Do not change the flow of oxygen unless directed by your health care provider.  Do not use alcohol or other sedating drugs unless instructed. They slow your breathing rate.  Do not use materials that burn easily (flammable) while using your oxygen.  Always keep a spare tank of oxygen. Plan ahead for holidays when you may not be able to get a prescription filled.  Use water-based lubricants on your lips or nostrils. Do not use an oil-based product like petroleum jelly.  To prevent your cheeks or the skin behind your ears from becoming irritated, tuck some gauze under the tubing.  If you have persistent redness under your nose, call your health care provider.  When you no longer need oxygen, your doctor will have the oxygen discontinued. Oxygen is not addicting or habit forming.  Use the oxygen as instructed. Too much oxygen can be harmful and too little will not give you the benefit you need.  Shortness of breath is not always from a lack of oxygen. If your oxygen level is not the cause of your shortness of breath, taking oxygen will not help. SEEK MEDICAL CARE IF:   You have frequent headaches.  You have shortness of breath or a lasting cough.  You have anxiety.  You are  confused.  You are drowsy or sleepy all the time.  You develop an illness which aggravates your breathing.  You cannot exercise.  You are restless.  You have blue lips or fingernails.  You have difficult or irregular breathing and it is getting worse.  You have a fever.   This information is not intended to replace advice given to you by your health care provider. Make sure you discuss any questions you have with your health care provider.   Document Released: 04/09/2003 Document Revised: 02/07/2014 Document Reviewed: 08/29/2012 Elsevier Interactive Patient Education [employer].

## 2015-11-28 NOTE — Progress Notes (Signed)
Per Jeri Lager, patient has Letairis from home in Pharmacy from a previous admission and will need to take it home when discharged.

## 2015-11-30 ENCOUNTER — Encounter (HOSPITAL_COMMUNITY): Payer: Self-pay | Admitting: *Deleted

## 2015-11-30 ENCOUNTER — Telehealth (HOSPITAL_COMMUNITY): Payer: Self-pay | Admitting: *Deleted

## 2015-11-30 ENCOUNTER — Non-Acute Institutional Stay (HOSPITAL_COMMUNITY)
Admission: AD | Admit: 2015-11-30 | Discharge: 2015-11-30 | Disposition: A | Discharge status: Home / Self Care | Payer: Medicare Other | Source: Ambulatory Visit | Attending: Internal Medicine | Admitting: Internal Medicine

## 2015-11-30 DIAGNOSIS — Z7982 Long term (current) use of aspirin: Secondary | ICD-10-CM | POA: Diagnosis not present

## 2015-11-30 DIAGNOSIS — Z9114 Patient's other noncompliance with medication regimen: Secondary | ICD-10-CM | POA: Insufficient documentation

## 2015-11-30 DIAGNOSIS — Z7901 Long term (current) use of anticoagulants: Secondary | ICD-10-CM | POA: Insufficient documentation

## 2015-11-30 DIAGNOSIS — Z8673 Personal history of transient ischemic attack (TIA), and cerebral infarction without residual deficits: Secondary | ICD-10-CM | POA: Insufficient documentation

## 2015-11-30 DIAGNOSIS — Z86711 Personal history of pulmonary embolism: Secondary | ICD-10-CM | POA: Diagnosis not present

## 2015-11-30 DIAGNOSIS — Z86718 Personal history of other venous thrombosis and embolism: Secondary | ICD-10-CM | POA: Insufficient documentation

## 2015-11-30 DIAGNOSIS — D5700 Hb-SS disease with crisis, unspecified: Secondary | ICD-10-CM | POA: Diagnosis not present

## 2015-11-30 DIAGNOSIS — I10 Essential (primary) hypertension: Secondary | ICD-10-CM | POA: Insufficient documentation

## 2015-11-30 HISTORY — DX: Cerebral infarction, unspecified: I63.9

## 2015-11-30 MED ORDER — DEXTROSE-NACL 5-0.45 % IV SOLN
INTRAVENOUS | Status: DC
  Administered 2015-11-30: 09:00:00 via INTRAVENOUS

## 2015-11-30 MED ORDER — SODIUM CHLORIDE 0.9 % IV SOLN
25.0000 mg | INTRAVENOUS | Status: DC | PRN
  Filled 2015-11-30: qty 0.5

## 2015-11-30 MED ORDER — DIPHENHYDRAMINE HCL 25 MG PO CAPS: 25.0000 mg | ORAL_CAPSULE | ORAL | Status: DC | PRN

## 2015-11-30 MED ORDER — KETOROLAC TROMETHAMINE 30 MG/ML IJ SOLN
30.0000 mg | Freq: Four times a day (QID) | INTRAMUSCULAR | Status: DC
  Administered 2015-11-30: 30 mg via INTRAVENOUS
  Filled 2015-11-30: qty 1

## 2015-11-30 MED ORDER — HYDROMORPHONE HCL 2 MG/ML IJ SOLN: 2.0000 mg | INTRAMUSCULAR | Status: DC | PRN

## 2015-11-30 MED ORDER — NALOXONE HCL 0.4 MG/ML IJ SOLN: 0.4000 mg | INTRAMUSCULAR | Status: DC | PRN

## 2015-11-30 MED ORDER — SENNOSIDES-DOCUSATE SODIUM 8.6-50 MG PO TABS
1.0000 | ORAL_TABLET | Freq: Two times a day (BID) | ORAL | Status: DC
  Filled 2015-11-30: qty 1

## 2015-11-30 MED ORDER — HYDROMORPHONE 1 MG/ML IV SOLN
INTRAVENOUS | Status: DC
  Administered 2015-11-30: 10:00:00 via INTRAVENOUS
  Administered 2015-11-30: 7 mg via INTRAVENOUS
  Administered 2015-11-30: 9.8 mg via INTRAVENOUS
  Filled 2015-11-30: qty 25

## 2015-11-30 MED ORDER — SODIUM CHLORIDE 0.9% FLUSH: 9.0000 mL | INTRAVENOUS | Status: DC | PRN

## 2015-11-30 MED ORDER — ONDANSETRON HCL 4 MG/2ML IJ SOLN: 4.0000 mg | Freq: Four times a day (QID) | INTRAMUSCULAR | Status: DC | PRN

## 2015-11-30 MED ORDER — HEPARIN SOD (PORK) LOCK FLUSH 100 UNIT/ML IV SOLN
500.0000 [IU] | INTRAVENOUS | Status: AC | PRN
  Administered 2015-11-30: 500 [IU]
  Filled 2015-11-30: qty 5

## 2015-11-30 MED ORDER — SODIUM CHLORIDE 0.9% FLUSH
10.0000 mL | INTRAVENOUS | Status: AC | PRN
  Administered 2015-11-30: 10 mL

## 2015-11-30 MED ORDER — POLYETHYLENE GLYCOL 3350 17 G PO PACK: 17.0000 g | PACK | Freq: Every day | ORAL | Status: DC | PRN

## 2015-11-30 NOTE — Progress Notes (Signed)
Pt received to the [facility] for treatment. Pt stated his pain was 9/10 and that it was in his legs, back and ribs. Pt was treated with IV fluids, Dilaudid PCA and IV Toradol. Pt was also treated with an incentive spirometry and started on O2 at 2 liters per min. Pt is on home O2.. Pt dienies chest pain or shortness of breathe. Pt was alert, oriented and ambulatory at discharge. Discharge instructions given to patient with verbal understanding. Pt encouraged to go home, get rest and use his oxygen and incentive spirometry. Pt stated that he would.

## 2015-11-30 NOTE — Telephone Encounter (Signed)
Advised per Dr. Doreene Burke to come to the [facility] for treatment and patient agrees.

## 2015-11-30 NOTE — Telephone Encounter (Signed)
Advised per Dr. Doreene Burke to come in for treatment of acute pain and patient agrees.

## 2015-11-30 NOTE — Discharge Instructions (Signed)

## 2015-11-30 NOTE — H&P (Signed)
[facility] History and Physical  JAHREE BLEEKER [id number] DOB: 09-05-1980 DOA: 11/30/2015  PCP: [name], MD   Chief Complaint: No chief complaint on file.   HPI: [name] is a 35 y.o. 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 Denies fever, chest pain, N/V, Diarrhea, abdominal pain or priapism. [place] Dilaudid and MS Contin at 6 am without relief. He was just discharged from the hospital 2 days ago after treatment for the same complaint. He has no new symptom of slurred speech, no new weakness or numbness.   Systemic Review: General: The patient denies anorexia, fever, weight loss Cardiac: Denies chest pain, syncope, palpitations, pedal edema  Respiratory: Denies cough, shortness of breath, wheezing GI: Denies severe indigestion/heartburn, abdominal pain, nausea, vomiting, diarrhea and constipation GU: Denies hematuria, incontinence, dysuria  Musculoskeletal: Denies arthritis  Skin: Denies suspicious skin lesions Neurologic: Denies focal weakness or numbness, change in vision  Past Medical History:  Diagnosis Date  . Acute chest syndrome ([place]) 06/18/2013  . Acute embolism and thrombosis of right internal jugular vein ([place])   . Alcohol consumption of one to four drinks per day   . Avascular necrosis (H[facility])    Right Hip  . Blood transfusion   . Chronic anticoagulation   . Demand ischemia ([place]) 01/02/2014  . Former smoker   . Functional asplenia   . Hb-SS disease with crisis ([place])   . History of Clostridium difficile infection   . History of pulmonary embolus (PE)   . Hypertension   .  Hypokalemia   . Leukocytosis    Chronic  . Mood disorder ([place])   . Noncompliance with medication regimen   . Oxygen deficiency   . Pulmonary hypertension   . Second hand tobacco smoke exposure   . Sickle cell anemia (HCC)   . Sickle-cell crisis with associated acute chest syndrome ([place]) 05/13/2013  . Stroke ([place])   . Thrombocytosis (HCC)    Chronic  . Uses marijuana     Past Surgical History:  Procedure Laterality Date  . CHOLECYSTECTOMY     01/2008  . Excision of left periauricular cyst     10/2009  . Excision of right ear lobe cyst with primary closur     11/2007  . Porta cath placement    . Porta cath removal    . PORTACATH PLACEMENT  01/05/2012   Procedure: INSERTION PORT-A-CATH;  Surgeon: Odis Hollingshead, MD;  Location: [facility];  Service: General;  Laterality: N/A;  ultrasound guiced port a cath insertion with fluoroscopy  . Right hip replacement     08/2006  . UMBILICAL HERNIA REPAIR     01/2008    No Known Allergies  Family History  Problem Relation Age of Onset  . Sickle cell trait Mother   . Depression Mother   . Diabetes Mother   . Sickle cell trait Father   . Sickle cell trait Brother       Prior to Admission medications   Medication Sig Start Date End Date Taking? Authorizing Provider  ambrisentan (LETAIRIS) 5 MG tablet Take 5 mg by mouth at bedtime.   Yes Historical Provider, MD  aspirin 81 MG chewable tablet  Chew 1 tablet (81 mg total) by mouth daily. 07/23/14  Yes [name], MD  Cholecalciferol (VITAMIN D) 2000 units tablet Take 1 tablet (2,000 Units total) by mouth daily. 02/23/15  Yes Dorena Dew, FNP  Deferasirox (JADENU) 360 MG TABS Take 1,080 mg by mouth daily.    Yes Historical Provider, MD  folic acid (FOLVITE) 1 MG tablet Take 1 tablet (1 mg total) by mouth every morning. 02/23/15  Yes Dorena Dew, FNP  gabapentin (NEURONTIN) 300 MG capsule Take 1 capsule (300 mg total) by mouth 3 (three) times daily. 06/01/15  Yes Tresa Garter, MD  HYDROmorphone (DILAUDID) 4 MG tablet Take 1 tablet (4 mg total) by mouth every 4 (four) hours as needed for severe pain. 11/28/15 12/13/15 Yes Chudney Scheffler Essie Christine, MD  hydroxyurea (HYDREA) 500 MG capsule Take 1,000 mg by mouth daily. May take with food to minimize GI side effects.   Yes Historical Provider, MD  lisinopril (PRINIVIL,ZESTRIL) 5 MG tablet Take 1 tablet (5 mg total) by mouth daily. 08/04/15  Yes [name], MD  morphine (MS CONTIN) 30 MG 12 hr tablet Take 1 tablet (30 mg total) by mouth every 12 (twelve) hours. 12/06/15 01/05/16 Yes Tresa Garter, MD  rivaroxaban (XARELTO) 20 MG TABS tablet Take 20 mg by mouth daily.    Yes Historical Provider, MD  torsemide (DEMADEX) 20 MG tablet Take 20 mg by mouth daily as needed. For increase in patient weight greater than 3 lbs. 07/27/15 07/26/16 Yes Historical Provider, MD  zolpidem (AMBIEN) 10 MG tablet Take 1 tablet (10 mg total) by mouth at bedtime as needed for sleep. 12/06/15 01/05/16 Yes Tresa Garter, MD     Physical Exam: Vitals:   11/30/15 0851 11/30/15 0952  BP: 130/84   Pulse: 79   Resp: 20 (!) 28  Temp: 97.7 F (36.5 C)   SpO2: 99% 96%  Weight: 175 lb (79.4 kg)   Height: 6' (1.829 m)     General: Alert, awake, afebrile, anicteric, not in obvious distress, chronically ill-looking, slurred speech HEENT: Normocephalic and Atraumatic, Mucous membranes pink                PERRLA; EOM intact; No scleral icterus,                 Nares: Patent, Oropharynx: Clear, Fair Dentition                 Neck: FROM, no cervical lymphadenopathy, thyromegaly, carotid bruit or JVD;  CHEST WALL: No tenderness  CHEST: Normal respiration, clear to auscultation bilaterally  HEART: Regular rate and rhythm; 3/6 systolic murmurs, loud P2, no rubs or gallops  BACK: No kyphosis or scoliosis; no CVA tenderness  ABDOMEN: Positive Bowel Sounds, soft, non-tender; no masses, no organomegaly EXTREMITIES: No cyanosis, clubbing, or  edema SKIN:  no rash or ulceration  CNS: Alert and Oriented x 4, Nonfocal exam, CN 2-12 intact  Labs on Admission:  Basic Metabolic Panel:  Recent Labs Lab 11/27/15 2252  NA 140  K 3.6  CL 109  CO2 26  GLUCOSE 118*  BUN 13  CREATININE 0.84  CALCIUM 9.0   Liver Function Tests:  Recent Labs Lab 11/27/15 2252  AST 38  ALT 28  ALKPHOS 77  BILITOT 4.5*  PROT 7.5  ALBUMIN 4.3   No results for input(s): LIPASE, AMYLASE in the last 168 hours. No results for input(s): AMMONIA in the last 168 hours. CBC:  Recent Labs  Lab 11/27/15 2252  WBC 11.3*  NEUTROABS 7.4  HGB 7.6*  HCT 23.3*  MCV 90.7  PLT 620*   Cardiac Enzymes:  Recent Labs Lab 11/28/15 0307 11/28/15 0940 11/28/15 1438  TROPONINI <0.03 0.01 <0.03    BNP (last 3 results)  Recent Labs  08/02/15 2307 11/28/15 0520  BNP 503.2* 422.5*    ProBNP (last 3 results) No results for input(s): PROBNP in the last 8760 hours.  CBG: No results for input(s): GLUCAP in the last 168 hours.   Assessment/Plan Active Problems:   Sickle cell anemia with pain ([place])   [facility]  IVF D5 .45% Saline @ 125 mls/hour  Weight based Dilaudid PCA started within 30 minutes of admission + clinician assisted doses of dilaudid 2 mg Q2h prn  IV Toradol 30 mg Q 6 H  Monitor vitals very closely, Re-evaluate pain scale every hour  2 L of Oxygen by [place]  Patient will be re-evaluated for pain in the context of function and relationship to baseline as care progresses.  If no significant relieve from pain (remains above 5/10) will transfer patient to inpatient services for further evaluation and management  Code Status: Full  Family Communication: None  DVT Prophylaxis: Ambulate as tolerated   Time spent: 34 Minutes  Keonia Pasko, MD, MHA, FACP, FAAP, CPE  If 7PM-7AM, please contact night-coverage [email or web address] 11/30/2015, 9:56 AM

## 2015-11-30 NOTE — Telephone Encounter (Signed)
Called complaining of pain in legs, back and ribs with pain intensity of 9/10 rating. Denies fevefr, chest pain, N/V, Diarrhea and abdominal pain. No priapism. Took Dilaudid and MS Contin at 6 am without relief.

## 2015-12-01 NOTE — Discharge Summary (Signed)
Physician Discharge Summary  [name] [id number] DOB: [birth year] DOA: 11/30/2015  PCP: [name], MD  Admit date: 11/30/2015  Discharge date: 11/30/2015  Time spent: 30 minutes  Discharge Diagnoses:  Active Problems:   Sickle cell anemia with pain [facility])   Discharge Condition: Stable  Diet recommendation: Regular  Filed Weights   11/30/15 0851  Weight: 175 lb (79.4 kg)    History of present illness:  [name] is a 35 y.o. 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 Denies fever, chest pain, N/V, Diarrhea, abdominal pain or priapism. [place] Dilaudid and MS Contin at 6 am without relief. He was just discharged from the hospital 2 days ago after treatment for the same complaint. He has no new symptom of slurred speech, no new weakness or numbness.   Hospital Course:  [name] was admitted to the day hospital with sickle cell painful crisis. Patient was treated with weight based IV Dilaudid PCA, IV Toradol as well as IV fluids. [employer] showed significant improvement symptomatically, pain improved from 9 to 5/10 at the time of discharge. Patient was discharged home in a hemodynamically stable condition. [name] will follow-up at the clinic as previously scheduled, continue with home medications as per prior to admission.  Discharge Instructions We discussed the need for good hydration, monitoring of hydration status, avoidance of heat, cold, stress, and infection triggers. We discussed the need to be compliant with taking Hydrea. [name] was reminded of the need to seek medical attention of any symptoms of  bleeding, anemia, or infection occurs.  Discharge Exam: Vitals:   11/30/15 1300 11/30/15 1410  BP: 104/67 104/62  Pulse: 70 78  Resp: 18 20  Temp:     General appearance: alert, cooperative and no distress Eyes: conjunctivae/corneas clear. PERRL, EOM's intact. Fundi benign. Neck: no adenopathy, no carotid bruit, no JVD, supple, symmetrical, trachea midline and thyroid not enlarged, symmetric, no tenderness/mass/nodules Back: symmetric, no curvature. ROM normal. No CVA tenderness. Resp: clear to auscultation bilaterally Chest wall: no tenderness Cardio: regular rate and rhythm, S1, S2 normal, no murmur, click, rub or gallop GI: soft, non-tender; bowel sounds normal; no masses, no organomegaly Extremities: extremities normal, atraumatic, no cyanosis or edema Pulses: 2+ and symmetric Skin: Skin color, texture, turgor normal. No rashes or lesions Neurologic: Grossly normal  Discharge Medication List as of 11/30/2015  3:32 PM    CONTINUE these medications which have NOT CHANGED   Details  ambrisentan (LETAIRIS) 5 MG tablet Take 5 mg by mouth at bedtime., Historical Med    aspirin 81 MG chewable tablet Chew 1 tablet (81 mg total) by mouth daily., Starting 07/23/2014, Until Discontinued, Normal    Cholecalciferol (VITAMIN D) 2000 units tablet Take 1 tablet (2,000 Units total) by mouth daily., Starting 02/23/2015, Until Discontinued, Normal    Deferasirox (JADENU) 360 MG TABS Take 1,080 mg by mouth daily. , Historical Med    folic acid (FOLVITE) 1 MG tablet Take 1 tablet (1 mg total) by mouth every morning., Starting 02/23/2015, Until Discontinued, Normal    gabapentin (NEURONTIN) 300 MG capsule Take 1 capsule (300 mg total) by mouth 3 (three) times daily., Starting 06/01/2015, Until Discontinued, Print  HYDROmorphone (DILAUDID) 4 MG tablet Take 1 tablet (4 mg total) by mouth every 4 (four) hours as needed for severe pain., Starting Sat 11/28/2015, Until Sun 12/13/2015, Print     hydroxyurea (HYDREA) 500 MG capsule Take 1,000 mg by mouth daily. May take with food to minimize GI side effects., Historical Med    lisinopril (PRINIVIL,ZESTRIL) 5 MG tablet Take 1 tablet (5 mg total) by mouth daily., Starting Tue 08/04/2015, Normal    morphine (MS CONTIN) 30 MG 12 hr tablet Take 1 tablet (30 mg total) by mouth every 12 (twelve) hours., Starting Sun 12/06/2015, Until Tue 01/05/2016, Print    rivaroxaban (XARELTO) 20 MG TABS tablet Take 20 mg by mouth daily. , Historical Med    torsemide (DEMADEX) 20 MG tablet Take 20 mg by mouth daily as needed. For increase in patient weight greater than 3 lbs., Starting Mon 07/27/2015, Until Tue 07/26/2016, Historical Med    zolpidem (AMBIEN) 10 MG tablet Take 1 tablet (10 mg total) by mouth at bedtime as needed for sleep., Starting Sun 12/06/2015, Until Tue 01/05/2016, Print       No Known Allergies   Significant Diagnostic Studies: Dg Chest 2 View  Result Date: 11/27/2015 CLINICAL DATA:  Sickle cell disease.  Chest pain for 2-3 days. EXAM: CHEST  2 VIEW COMPARISON:  Chest radiograph 11/23/2015 FINDINGS: There is a left chest wall Port-A-Cath with the tip in the proximal right atrium, unchanged. Unchanged cardiomegaly. No pulmonary edema. No focal airspace consolidation. No pleural effusion or pneumothorax. Unchanged hazy opacities and nodular density of the right mid lung. IMPRESSION: Unchanged cardiomegaly without overt pulmonary edema. Unchanged hazy opacities in the right mid lung. Electronically Signed   By: Ulyses Jarred M.D.   On: 11/27/2015 23:05   Dg Chest 2 View  Result Date: 11/23/2015 CLINICAL DATA:  Bilateral side pain for 1 week with hypoxia. EXAM: CHEST  2 VIEW COMPARISON:  11/22/2015 FINDINGS: The lungs are clear wiithout focal pneumonia, edema, pneumothorax or pleural effusion. The cardio pericardial silhouette is enlarged. There is pulmonary vascular congestion without overt pulmonary edema. Nodular areas in the peripheral  aspect of the right mid lung were seen on CT scan from 2 weeks ago. Left Port-A-Cath tip projects at the SVC/RA junction. Telemetry leads overlie the chest. IMPRESSION: Cardiomegaly with pulmonary vascular congestion. Nodular density right mid lung better seen on recent CT scan. Electronically Signed   By: Misty Stanley M.D.   On: 11/23/2015 10:34   Ct Head Wo Contrast  Result Date: 11/10/2015 CLINICAL DATA:  Left-sided weakness. EXAM: CT HEAD WITHOUT CONTRAST TECHNIQUE: Contiguous axial images were obtained from the base of the skull through the vertex without intravenous contrast. COMPARISON:  None. FINDINGS: Brain: No evidence of acute infarction, hemorrhage, extra-axial collection, ventriculomegaly, or mass effect. Old left basal ganglia 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 Generalized cerebral atrophy. Periventricular white matter low attenuation likely secondary to microangiopathy. Vascular: No significant cerebral vascular atherosclerotic calcifications are noted. Skull: Negative for fracture or focal lesion. Sinuses/Orbits: Visualized portions of the orbits are unremarkable. Visualized portions of the paranasal sinuses and mastoid air cells are unremarkable. Other: None. IMPRESSION: 1. No acute intracranial pathology. Electronically Signed   By: Kathreen Devoid   On: 11/10/2015 11:16   Ct Angio Chest Pe W/cm &/or Wo Cm  Result Date: 11/10/2015 CLINICAL DATA:  Left flank pain and lethargy. Difficulty walking and facial droop with slurred speech. Hypoxia. Shortness of breath. Sickle cell anemia. EXAM: CT ANGIOGRAPHY CHEST WITH CONTRAST  TECHNIQUE: Multidetector CT imaging of the chest was performed using the standard protocol during bolus administration of intravenous contrast. Multiplanar CT image reconstructions and MIPs were obtained to evaluate the vascular anatomy. CONTRAST:  65 cc Isovue 370 COMPARISON:  Multiple exams, including 11/10/2015 chest radiograph, and CT of 11/30/2014  FINDINGS: Cardiovascular: Filling defect compatible with acute pulmonary embolus in the right middle lobe, right lower lobe, and potentially left lower lobe signal told pulmonary vessels. Marked cardiomegaly with elongation of the heart, RV:LV ratio 1.5 (abnormally elevated). Small pericardial effusion. Enlarged main pulmonary artery at 4.5 cm transverse. Left Port-A-Cath noted, tip a in the right atrium. Mediastinum/Nodes: Mass or confluent nodularity in the anterior mediastinum including a 1.7 by 4.5 cm anterior mediastinal component on image 50/4, confluent lymph nodes were also present previously but somewhat less striking. Right hilar node 1.3 cm in short axis on image 57/4, previously 0.6 cm. A posterior right hilar node measures 1.4 cm in short axis on image 64/4, formerly 0.8 cm. Lungs/Pleura: Peripheral foraminal airspace opacity at the right lung apex is new possibly from pulmonary hemorrhage. There is bilateral primarily perihilar ground-glass opacities and patchy sub solid opacities in both lower lobes and to a lesser extent peripherally in the upper lobes. Likely a combination of edema and pulmonary hemorrhage along with scattered atelectasis. No significant pleural effusion. Upper Abdomen: Diffuse calcification of the spleen, similar to prior. 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 Endplate notching of the thoracic vertebra characteristic of sickle cell disease. Review of the MIP images confirms the above findings. IMPRESSION: 1. Acute pulmonary embolus is present in segmental branches of the right middle lobe and right lower lobe, and possibly the left lower lobe on today's exam. Positive for acute PE with CT evidence of right heart strain (RV/LV Ratio = 1.5) consistent with at least submassive (intermediate risk) PE. The presence of right heart strain has been associated with an increased risk of morbidity and mortality. Please activate Code PE by  paging [phone]. 2. Stable marked cardiomegaly. 3. Small pericardial effusion. 4. Enlarged main pulmonary artery at 4.5 cm transverse. 5. Patchy ground-glass and peripheral opacities in both lungs likely a combination of mild pulmonary edema, pulmonary hemorrhage, and scattered atelectasis. 6. Osseous findings of sickle cell disease. 7. There are enlarged mediastinal and right hilar lymph nodes, increased from prior. These may merit follow up in the nonacute setting to ensure resolution and exclude the possibility of entities such as lymphoproliferative disease. Critical Value/emergent results were called by telephone at the time of interpretation on 11/10/2015 at 1:06 pm to Dr. Threasa Beards BELFI , who verbally acknowledged these results. Electronically Signed   By: Van Clines M.D.   On: 11/10/2015 13:08   Mr Brain Wo Contrast  Result Date: 11/10/2015 CLINICAL DATA:  Sickle cell disease. Gait disturbance and left facial droop. EXAM: MRI HEAD WITHOUT CONTRAST TECHNIQUE: Multiplanar, multiecho pulse sequences of the brain and surrounding structures were obtained without intravenous contrast. COMPARISON:  Head CT same day. FINDINGS: Brain: There are scattered acute infarctions. Small foci of acute infarction are seen within the left superior cerebellum and left posterior pons/midbrain junction. There is acute infarction within the left occipital cortex measuring about 2 in diameter. There is a small acute infarction within the left corpus callosum more cingulate gyrus. A few punctate acute infarctions are seen in the left frontoparietal cortex. No acute infarction affecting the right hemisphere. There are multiple old bilateral cerebellar infarctions. There are old small vessel infarctions  affecting the thalami I and basal ganglia left more than right. No mass lesion, hemorrhage, hydrocephalus or extra-axial collection. Vascular: Major vessels at the base of the brain show flow. Skull and upper cervical  spine: Negative except for chronic marrow changes of sickle cell. Sinuses/Orbits: Chronic marrow changes of sickle cell. No inflammatory sinus disease. Orbits negative. Other: None significant IMPRESSION: Background pattern of old infarctions throughout the cerebellum and old lacunar infarctions affecting the thalami basal ganglia. Multiple acute infarctions including areas of involvement in the left cerebellum, left dorsal pons midbrain junction, left occipital lobe, the left corpus callosum or cingulate gyrus, and a few small punctate foci in the left frontoparietal cortex. Findings are most consistent with embolic disease from the heart or ascending aorta given the combination of anterior and posterior circulation infarctions. Electronically Signed   By: Nelson Chimes M.D.   On: 11/10/2015 16:33   Dg Chest Port 1 View  Result Date: 11/22/2015 CLINICAL DATA:  Sickle-cell crisis, acute chest syndrome EXAM: PORTABLE CHEST 1 VIEW COMPARISON:  11/10/2015 FINDINGS: Marked cardiomegaly with mild vascular congestion and diffuse interstitial prominence. Basilar peripheral Kerley B-lines noted suspicious for mild basilar interstitial edema pattern. There is associated increased bibasilar atelectasis. No large effusion or pneumothorax. Trachea is midline. Left subclavian power port catheter tip at the SVC RA junction. IMPRESSION: Marked cardiomegaly with mild basilar interstitial edema pattern and increased basilar atelectasis. Electronically Signed   By: Jerilynn Mages.  Shick M.D.   On: 11/22/2015 08:40   Dg Chest Port 1 View  Result Date: 11/10/2015 CLINICAL DATA:  History of sickle cell disease, now with shortness of breath and hypoxia. EXAM: PORTABLE CHEST 1 VIEW COMPARISON:  10/28/2015; 09/26/2015; 08/07/2015 FINDINGS: Similar findings of enlarged cardiac silhouette and mediastinal contours given reduced lung volumes. Pulmonary vasculature is indistinct with cephalization of flow. Grossly unchanged bilateral infrahilar  heterogeneous opacities favored to represent atelectasis. Stable position of support apparatus. Trace pleural effusions are not excluded. No pneumothorax. 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 IMPRESSION: 1. Findings worrisome for pulmonary edema on this AP portable examination. Note, atypical infection could have a similar appearance. Further evaluation with a PA and lateral chest radiograph may be obtained as clinically indicated. 2. Similar findings of cardiomegaly. Electronically Signed   By: Sandi Mariscal M.D.   On: 11/10/2015 10:49    Signed:  [name] MD, [place], [place], [place], CPE   12/01/2015, 12:51 PM

## 2015-12-03 ENCOUNTER — Encounter (HOSPITAL_COMMUNITY): Payer: Self-pay | Admitting: *Deleted

## 2015-12-03 DIAGNOSIS — D5700 Hb-SS disease with crisis, unspecified: Principal | ICD-10-CM | POA: Diagnosis present

## 2015-12-03 DIAGNOSIS — D638 Anemia in other chronic diseases classified elsewhere: Secondary | ICD-10-CM | POA: Diagnosis present

## 2015-12-03 DIAGNOSIS — D72829 Elevated white blood cell count, unspecified: Secondary | ICD-10-CM | POA: Diagnosis not present

## 2015-12-03 DIAGNOSIS — I2781 Cor pulmonale (chronic): Secondary | ICD-10-CM | POA: Diagnosis not present

## 2015-12-03 DIAGNOSIS — Z86711 Personal history of pulmonary embolism: Secondary | ICD-10-CM

## 2015-12-03 DIAGNOSIS — I480 Paroxysmal atrial fibrillation: Secondary | ICD-10-CM | POA: Diagnosis not present

## 2015-12-03 DIAGNOSIS — Z96641 Presence of right artificial hip joint: Secondary | ICD-10-CM | POA: Diagnosis not present

## 2015-12-03 DIAGNOSIS — Z87891 Personal history of nicotine dependence: Secondary | ICD-10-CM

## 2015-12-03 DIAGNOSIS — Z7982 Long term (current) use of aspirin: Secondary | ICD-10-CM | POA: Diagnosis not present

## 2015-12-03 DIAGNOSIS — I2721 Secondary pulmonary arterial hypertension: Secondary | ICD-10-CM | POA: Diagnosis present

## 2015-12-03 DIAGNOSIS — R609 Edema, unspecified: Secondary | ICD-10-CM | POA: Diagnosis present

## 2015-12-03 DIAGNOSIS — Z7901 Long term (current) use of anticoagulants: Secondary | ICD-10-CM

## 2015-12-03 DIAGNOSIS — I10 Essential (primary) hypertension: Secondary | ICD-10-CM | POA: Diagnosis not present

## 2015-12-03 DIAGNOSIS — Z86718 Personal history of other venous thrombosis and embolism: Secondary | ICD-10-CM | POA: Diagnosis not present

## 2015-12-03 DIAGNOSIS — J9611 Chronic respiratory failure with hypoxia: Secondary | ICD-10-CM | POA: Diagnosis present

## 2015-12-03 DIAGNOSIS — I69320 Aphasia following cerebral infarction: Secondary | ICD-10-CM | POA: Diagnosis not present

## 2015-12-03 DIAGNOSIS — D571 Sickle-cell disease without crisis: Secondary | ICD-10-CM | POA: Diagnosis not present

## 2015-12-03 NOTE — ED Triage Notes (Signed)
Pt complains of sickle cell pain crisis. Pt states pain has been intermittent throughout week. Pain is located in bilateral ribs and lower back.

## 2015-12-04 ENCOUNTER — Inpatient Hospital Stay (HOSPITAL_COMMUNITY)
Admission: EM | Admit: 2015-12-04 | Discharge: 2015-12-09 | DRG: 812 | Disposition: A | Discharge status: Home / Self Care | Payer: Medicare Other | Attending: Internal Medicine | Admitting: Internal Medicine

## 2015-12-04 DIAGNOSIS — D5700 Hb-SS disease with crisis, unspecified: Secondary | ICD-10-CM | POA: Diagnosis not present

## 2015-12-04 DIAGNOSIS — Z7901 Long term (current) use of anticoagulants: Secondary | ICD-10-CM | POA: Diagnosis not present

## 2015-12-04 DIAGNOSIS — R0989 Other specified symptoms and signs involving the circulatory and respiratory systems: Secondary | ICD-10-CM | POA: Diagnosis not present

## 2015-12-04 DIAGNOSIS — I10 Essential (primary) hypertension: Secondary | ICD-10-CM | POA: Diagnosis not present

## 2015-12-04 DIAGNOSIS — I2781 Cor pulmonale (chronic): Secondary | ICD-10-CM | POA: Diagnosis not present

## 2015-12-04 DIAGNOSIS — I482 Chronic atrial fibrillation, unspecified: Secondary | ICD-10-CM | POA: Diagnosis present

## 2015-12-04 DIAGNOSIS — Z7982 Long term (current) use of aspirin: Secondary | ICD-10-CM | POA: Diagnosis not present

## 2015-12-04 DIAGNOSIS — I2721 Secondary pulmonary arterial hypertension: Secondary | ICD-10-CM | POA: Diagnosis present

## 2015-12-04 DIAGNOSIS — Z86711 Personal history of pulmonary embolism: Secondary | ICD-10-CM | POA: Diagnosis present

## 2015-12-04 DIAGNOSIS — Z8673 Personal history of transient ischemic attack (TIA), and cerebral infarction without residual deficits: Secondary | ICD-10-CM | POA: Diagnosis present

## 2015-12-04 DIAGNOSIS — I480 Paroxysmal atrial fibrillation: Secondary | ICD-10-CM | POA: Diagnosis present

## 2015-12-04 DIAGNOSIS — I69822 Dysarthria following other cerebrovascular disease: Secondary | ICD-10-CM

## 2015-12-04 DIAGNOSIS — I639 Cerebral infarction, unspecified: Secondary | ICD-10-CM

## 2015-12-04 DIAGNOSIS — I69815 Cognitive social or emotional deficit following other cerebrovascular disease: Secondary | ICD-10-CM

## 2015-12-04 DIAGNOSIS — D72829 Elevated white blood cell count, unspecified: Secondary | ICD-10-CM | POA: Diagnosis present

## 2015-12-04 DIAGNOSIS — R609 Edema, unspecified: Secondary | ICD-10-CM | POA: Diagnosis present

## 2015-12-04 DIAGNOSIS — J9611 Chronic respiratory failure with hypoxia: Secondary | ICD-10-CM | POA: Diagnosis present

## 2015-12-04 DIAGNOSIS — Z86718 Personal history of other venous thrombosis and embolism: Secondary | ICD-10-CM | POA: Diagnosis not present

## 2015-12-04 DIAGNOSIS — Z87891 Personal history of nicotine dependence: Secondary | ICD-10-CM | POA: Diagnosis not present

## 2015-12-04 DIAGNOSIS — Z96641 Presence of right artificial hip joint: Secondary | ICD-10-CM | POA: Diagnosis present

## 2015-12-04 DIAGNOSIS — D638 Anemia in other chronic diseases classified elsewhere: Secondary | ICD-10-CM | POA: Diagnosis not present

## 2015-12-04 DIAGNOSIS — I69320 Aphasia following cerebral infarction: Secondary | ICD-10-CM | POA: Diagnosis not present

## 2015-12-04 LAB — COMPREHENSIVE METABOLIC PANEL
ALT: 19 U/L (ref 17–63)
ANION GAP: 6 (ref 5–15)
AST: 27 U/L (ref 15–41)
Albumin: 4.1 g/dL (ref 3.5–5.0)
Alkaline Phosphatase: 70 U/L (ref 38–126)
BUN: 13 mg/dL (ref 6–20)
CHLORIDE: 110 mmol/L (ref 101–111)
CO2: 22 mmol/L (ref 22–32)
Calcium: 8.6 mg/dL — ABNORMAL LOW (ref 8.9–10.3)
Creatinine, Ser: 0.81 mg/dL (ref 0.61–1.24)
Glucose, Bld: 101 mg/dL — ABNORMAL HIGH (ref 65–99)
POTASSIUM: 3.9 mmol/L (ref 3.5–5.1)
SODIUM: 138 mmol/L (ref 135–145)
Total Bilirubin: 5.9 mg/dL — ABNORMAL HIGH (ref 0.3–1.2)
Total Protein: 7.1 g/dL (ref 6.5–8.1)

## 2015-12-04 LAB — CBC WITH DIFFERENTIAL/PLATELET
BASOS PCT: 0 %
Basophils Absolute: 0 10*3/uL (ref 0.0–0.1)
EOS ABS: 0.3 10*3/uL (ref 0.0–0.7)
EOS PCT: 3 %
HCT: 22.6 % — ABNORMAL LOW (ref 39.0–52.0)
HEMOGLOBIN: 7.5 g/dL — AB (ref 13.0–17.0)
LYMPHS PCT: 18 %
Lymphs Abs: 2 10*3/uL (ref 0.7–4.0)
MCH: 30 pg (ref 26.0–34.0)
MCHC: 33.2 g/dL (ref 30.0–36.0)
MCV: 90.4 fL (ref 78.0–100.0)
MONO ABS: 1.5 10*3/uL — AB (ref 0.1–1.0)
Monocytes Relative: 13 %
NEUTROS PCT: 66 %
Neutro Abs: 7.5 10*3/uL (ref 1.7–7.7)
PLATELETS: 453 10*3/uL — AB (ref 150–400)
RBC: 2.5 MIL/uL — AB (ref 4.22–5.81)
RDW: 20.1 % — ABNORMAL HIGH (ref 11.5–15.5)
WBC: 11.3 10*3/uL — AB (ref 4.0–10.5)

## 2015-12-04 LAB — MRSA PCR SCREENING: MRSA BY PCR: NEGATIVE

## 2015-12-04 LAB — RETICULOCYTES
RBC.: 2.5 MIL/uL — ABNORMAL LOW (ref 4.22–5.81)
RETIC COUNT ABSOLUTE: 197.5 10*3/uL — AB (ref 19.0–186.0)
RETIC CT PCT: 7.9 % — AB (ref 0.4–3.1)

## 2015-12-04 MED ORDER — ASPIRIN 81 MG PO CHEW
81.0000 mg | CHEWABLE_TABLET | Freq: Every day | ORAL | Status: DC
  Administered 2015-12-04 – 2015-12-09 (×6): 81 mg via ORAL
  Filled 2015-12-04 (×6): qty 1

## 2015-12-04 MED ORDER — MORPHINE SULFATE ER 30 MG PO TBCR
30.0000 mg | EXTENDED_RELEASE_TABLET | Freq: Two times a day (BID) | ORAL | Status: DC
  Administered 2015-12-04 – 2015-12-09 (×11): 30 mg via ORAL
  Filled 2015-12-04 (×11): qty 1

## 2015-12-04 MED ORDER — HYDROXYUREA 500 MG PO CAPS
1000.0000 mg | ORAL_CAPSULE | Freq: Every day | ORAL | Status: DC
  Administered 2015-12-04 – 2015-12-07 (×4): 1000 mg via ORAL
  Filled 2015-12-04 (×4): qty 2

## 2015-12-04 MED ORDER — HYDROMORPHONE HCL 2 MG/ML IJ SOLN
2.0000 mg | INTRAMUSCULAR | Status: DC
  Administered 2015-12-04 – 2015-12-07 (×24): 2 mg via INTRAVENOUS
  Filled 2015-12-04 (×24): qty 1

## 2015-12-04 MED ORDER — HYDROMORPHONE HCL 2 MG/ML IJ SOLN
2.0000 mg | Freq: Once | INTRAMUSCULAR | Status: AC
  Administered 2015-12-04: 2 mg via INTRAVENOUS
  Filled 2015-12-04: qty 1

## 2015-12-04 MED ORDER — DIPHENHYDRAMINE HCL 12.5 MG/5ML PO ELIX
12.5000 mg | ORAL_SOLUTION | Freq: Four times a day (QID) | ORAL | Status: DC | PRN
  Administered 2015-12-07 – 2015-12-08 (×3): 12.5 mg via ORAL
  Filled 2015-12-04 (×3): qty 5

## 2015-12-04 MED ORDER — VITAMIN D 1000 UNITS PO TABS
2000.0000 [IU] | ORAL_TABLET | Freq: Every day | ORAL | Status: DC
  Administered 2015-12-04 – 2015-12-09 (×6): 2000 [IU] via ORAL
  Filled 2015-12-04 (×6): qty 2

## 2015-12-04 MED ORDER — DIPHENHYDRAMINE HCL 25 MG PO CAPS
25.0000 mg | ORAL_CAPSULE | Freq: Once | ORAL | Status: AC
  Administered 2015-12-04: 25 mg via ORAL
  Filled 2015-12-04: qty 1

## 2015-12-04 MED ORDER — KETOROLAC TROMETHAMINE 30 MG/ML IJ SOLN
30.0000 mg | Freq: Four times a day (QID) | INTRAMUSCULAR | Status: AC
  Administered 2015-12-04 – 2015-12-09 (×20): 30 mg via INTRAVENOUS
  Filled 2015-12-04 (×20): qty 1

## 2015-12-04 MED ORDER — HYDROMORPHONE HCL 2 MG/ML IJ SOLN
2.0000 mg | INTRAMUSCULAR | Status: AC
  Administered 2015-12-04: 2 mg via INTRAVENOUS
  Filled 2015-12-04: qty 1

## 2015-12-04 MED ORDER — SENNOSIDES-DOCUSATE SODIUM 8.6-50 MG PO TABS
1.0000 | ORAL_TABLET | Freq: Two times a day (BID) | ORAL | Status: DC
  Administered 2015-12-04 – 2015-12-09 (×11): 1 via ORAL
  Filled 2015-12-04 (×11): qty 1

## 2015-12-04 MED ORDER — POLYETHYLENE GLYCOL 3350 17 G PO PACK: 17.0000 g | PACK | Freq: Every day | ORAL | Status: DC | PRN

## 2015-12-04 MED ORDER — SODIUM CHLORIDE 0.9% FLUSH: 9.0000 mL | INTRAVENOUS | Status: DC | PRN

## 2015-12-04 MED ORDER — AMBRISENTAN 5 MG PO TABS
5.0000 mg | ORAL_TABLET | Freq: Every day | ORAL | Status: DC
  Administered 2015-12-04 – 2015-12-08 (×5): 5 mg via ORAL

## 2015-12-04 MED ORDER — DEFERASIROX 360 MG PO TABS
1080.0000 mg | ORAL_TABLET | Freq: Every day | ORAL | Status: DC
  Administered 2015-12-04 – 2015-12-05 (×2): 1080 mg via ORAL

## 2015-12-04 MED ORDER — HYDROMORPHONE HCL 2 MG/ML IJ SOLN: 2.0000 mg | INTRAMUSCULAR | Status: AC

## 2015-12-04 MED ORDER — LISINOPRIL 10 MG PO TABS
5.0000 mg | ORAL_TABLET | Freq: Every day | ORAL | Status: DC
  Administered 2015-12-04 – 2015-12-09 (×6): 5 mg via ORAL
  Filled 2015-12-04 (×6): qty 1

## 2015-12-04 MED ORDER — FAMOTIDINE 20 MG PO TABS
20.0000 mg | ORAL_TABLET | Freq: Every day | ORAL | Status: DC
  Administered 2015-12-04 – 2015-12-09 (×6): 20 mg via ORAL
  Filled 2015-12-04 (×6): qty 1

## 2015-12-04 MED ORDER — NALOXONE HCL 0.4 MG/ML IJ SOLN: 0.4000 mg | INTRAMUSCULAR | Status: DC | PRN

## 2015-12-04 MED ORDER — HYDROMORPHONE 1 MG/ML IV SOLN
INTRAVENOUS | Status: DC
  Administered 2015-12-04: 4.2 mg via INTRAVENOUS
  Administered 2015-12-04: 20:00:00 via INTRAVENOUS
  Administered 2015-12-05: 4.73 mg via INTRAVENOUS
  Administered 2015-12-05: 5.6 mg via INTRAVENOUS
  Administered 2015-12-05: 7.64 mg via INTRAVENOUS
  Administered 2015-12-05: 2.8 mg via INTRAVENOUS
  Administered 2015-12-05: 22:00:00 via INTRAVENOUS
  Administered 2015-12-05: 5.6 mg via INTRAVENOUS
  Administered 2015-12-05: 1.7 mg via INTRAVENOUS
  Administered 2015-12-05: 5.6 mg via INTRAVENOUS
  Administered 2015-12-06 (×2): 2.8 mg via INTRAVENOUS
  Administered 2015-12-06: 3.22 mg via INTRAVENOUS
  Administered 2015-12-06: 9.1 mg via INTRAVENOUS
  Administered 2015-12-06: 17:00:00 via INTRAVENOUS
  Administered 2015-12-06: 7.7 mg via INTRAVENOUS
  Administered 2015-12-06: 9.1 mg via INTRAVENOUS
  Administered 2015-12-07: 7 mg via INTRAVENOUS
  Administered 2015-12-07: 11.3 mg via INTRAVENOUS
  Administered 2015-12-07: 9.7 mg via INTRAVENOUS
  Administered 2015-12-07: 0.7 mg via INTRAVENOUS
  Administered 2015-12-07: 12:00:00 via INTRAVENOUS
  Administered 2015-12-07: 11.2 mg via INTRAVENOUS
  Administered 2015-12-08: 14 mg via INTRAVENOUS
  Administered 2015-12-08: 5.6 mg via INTRAVENOUS
  Administered 2015-12-08: 6.96 mg via INTRAVENOUS
  Administered 2015-12-08: 2.8 mg via INTRAVENOUS
  Administered 2015-12-08: 4.9 mg via INTRAVENOUS
  Administered 2015-12-08: 0.7 mg via INTRAVENOUS
  Administered 2015-12-09: 10.5 mg via INTRAVENOUS
  Administered 2015-12-09: 10:00:00 via INTRAVENOUS
  Administered 2015-12-09: 9.8 mg via INTRAVENOUS
  Administered 2015-12-09: 13.3 mg via INTRAVENOUS
  Administered 2015-12-09: 12.2 mg via INTRAVENOUS
  Administered 2015-12-09: 0.7 mg via INTRAVENOUS
  Filled 2015-12-04 (×7): qty 25

## 2015-12-04 MED ORDER — HYDROMORPHONE 1 MG/ML IV SOLN
INTRAVENOUS | Status: DC
  Administered 2015-12-04: 08:00:00 via INTRAVENOUS
  Filled 2015-12-04: qty 25

## 2015-12-04 MED ORDER — RIVAROXABAN 20 MG PO TABS
20.0000 mg | ORAL_TABLET | Freq: Every day | ORAL | Status: DC
  Administered 2015-12-04 – 2015-12-09 (×6): 20 mg via ORAL
  Filled 2015-12-04 (×7): qty 1

## 2015-12-04 MED ORDER — DEXTROSE-NACL 5-0.45 % IV SOLN
INTRAVENOUS | Status: DC
  Administered 2015-12-04 – 2015-12-05 (×3): via INTRAVENOUS
  Administered 2015-12-05 (×2): 1000 mL via INTRAVENOUS
  Administered 2015-12-06 – 2015-12-07 (×2): via INTRAVENOUS

## 2015-12-04 MED ORDER — DIPHENHYDRAMINE HCL 50 MG/ML IJ SOLN
12.5000 mg | Freq: Four times a day (QID) | INTRAMUSCULAR | Status: DC | PRN
  Administered 2015-12-04 – 2015-12-06 (×3): 12.5 mg via INTRAVENOUS
  Filled 2015-12-04 (×3): qty 1

## 2015-12-04 MED ORDER — ONDANSETRON HCL 4 MG/2ML IJ SOLN
4.0000 mg | INTRAMUSCULAR | Status: DC | PRN
  Administered 2015-12-07: 4 mg via INTRAVENOUS
  Filled 2015-12-04: qty 2

## 2015-12-04 MED ORDER — KETOROLAC TROMETHAMINE 30 MG/ML IJ SOLN
30.0000 mg | Freq: Once | INTRAMUSCULAR | Status: AC
  Administered 2015-12-04: 30 mg via INTRAVENOUS
  Filled 2015-12-04: qty 1

## 2015-12-04 MED ORDER — FOLIC ACID 1 MG PO TABS
1.0000 mg | ORAL_TABLET | Freq: Every morning | ORAL | Status: DC
  Administered 2015-12-04 – 2015-12-09 (×6): 1 mg via ORAL
  Filled 2015-12-04 (×6): qty 1

## 2015-12-04 MED ORDER — ZOLPIDEM TARTRATE 10 MG PO TABS
10.0000 mg | ORAL_TABLET | Freq: Every evening | ORAL | Status: DC | PRN
  Administered 2015-12-04 – 2015-12-08 (×4): 10 mg via ORAL
  Filled 2015-12-04 (×4): qty 1

## 2015-12-04 MED ORDER — GABAPENTIN 300 MG PO CAPS
300.0000 mg | ORAL_CAPSULE | Freq: Three times a day (TID) | ORAL | Status: DC
  Administered 2015-12-04 – 2015-12-09 (×17): 300 mg via ORAL
  Filled 2015-12-04 (×17): qty 1

## 2015-12-04 MED ORDER — ONDANSETRON HCL 4 MG PO TABS
4.0000 mg | ORAL_TABLET | ORAL | Status: DC | PRN
  Administered 2015-12-05: 4 mg via ORAL
  Filled 2015-12-04: qty 1

## 2015-12-04 NOTE — Plan of Care (Signed)
Problem: Pain Managment: Goal: General experience of comfort will improve Continue   Problem: Skin Integrity: Goal: Risk for impaired skin integrity will decrease Outcome: Completed/Met Date Met: 12/04/15 No skin breakdown, braden score 22.  Pt frequently repositions self.    Problem: Activity: Goal: Risk for activity intolerance will decrease Outcome: Completed/Met Date Met: 12/04/15 Pt independent with steady gait.  No weakness or activity intolerance noted.    Problem: Nutrition: Goal: Adequate nutrition will be maintained Outcome: Completed/Met Date Met: 12/04/15 Excellent appetite and intake.   Problem: Bowel/Gastric: Goal: Will not experience complications related to bowel motility Outcome: Completed/Met Date Met: 12/04/15 LBM yesterday, will continue to monitor.

## 2015-12-04 NOTE — ED Notes (Signed)
This RN was unsuccessful with port access x1.  Charge RN to try

## 2015-12-04 NOTE — ED Provider Notes (Signed)
[facility] DEPT Provider Note   CSN: [id number] Arrival date & time: 12/03/15  2258  By signing my name below, I, Soijett Blue, attest that this documentation has been prepared under the direction and in the presence of Everlene Balls, MD. Electronically Signed: Soijett Blue, ED Scribe. 12/04/15. 2:47 AM.   History   Chief Complaint Chief Complaint  Patient presents with  . Sickle Cell Pain Crisis    HPI [name] is a 35 y.o. male with a PMHx of sickle cell anemia, HTN, stroke, who presents to the Emergency Department complaining of intermittent sickle cell pain crisis onset 4 days. Pt notes that when he has a flare of his sickle cell pain, the pain is localized to his bilateral ribs and lower back. Pt reports that his provider that manages his sickle cell pain is Dr. Doreene Burke and he is Rx dilaudid. He states that he is having associated symptoms of bilateral rib pain and lower back pain. He states that he has tried Rx dilaudid with no relief for his symptoms. He denies any other symptoms.   The history is provided by the patient. No language interpreter was used.    Past Medical History:  Diagnosis Date  . Acute chest syndrome ([place]) 06/18/2013  . Acute embolism and thrombosis of right internal jugular vein ([place])   . Alcohol consumption of one to four drinks per day   . Avascular necrosis (HCC)    Right Hip  . Blood transfusion   . Chronic anticoagulation   . Demand ischemia ([place]) 01/02/2014  . Former smoker   . Functional asplenia   . Hb-SS disease with crisis ([place])   . History of Clostridium difficile infection   . History of pulmonary embolus (PE)   . Hypertension   . Hypokalemia   . Leukocytosis    Chronic  . Mood disorder ([place])   . Noncompliance with medication regimen   . Oxygen deficiency   . Pulmonary hypertension   . Second hand tobacco smoke exposure   . Sickle cell anemia (HCC)   . Sickle-cell crisis with associated acute chest syndrome ([place]) 05/13/2013   . Stroke ([place])   . Thrombocytosis (HCC)    Chronic  . Uses marijuana     Patient Active Problem List   Diagnosis Date Noted  . Slurred speech 11/10/2015  . Pulmonary emboli ([place]) 11/10/2015  . Aphasia   . Acute chest syndrome ([place])   . Ischemic stroke ([place])   . Sickle cell crisis ([place]) 10/27/2015  . Sickle cell pain crisis ([place]) 09/18/2015  . Sickle cell anemia with crisis ([place]) 09/04/2015  . Shortness of breath   . Pulmonary embolism without acute cor pulmonale ([employer])   . Fever   . Sickle cell anemia with pain ([place]) 05/07/2015  . Acute kidney injury ([place])   . Hypoxia   . Anemia 01/31/2015  . Symptomatic anemia   . Hb-SS disease without crisis ([place]) 10/07/2014  . Acute on chronic respiratory failure with hypoxia ([place])   . PVC's (premature ventricular contractions) 09/10/2014  . Abnormal EKG 09/10/2014  . Chronic atrial fibrillation ([place]) 09/09/2014  . Cor pulmonale, chronic ([place]) 08/25/2014  . Sickle cell anemia ([place]) 06/25/2014  . Anemia of chronic disease 06/25/2014  . PAH (pulmonary artery hypertension) 03/18/2014  . Hb-SS disease with crisis ([place]) 01/22/2014  . Pulmonary embolus ([place]) 01/01/2014  . Paralytic strabismus, external ophthalmoplegia   . Chronic pain syndrome 12/12/2013  . Elevated troponin 11/26/2013  . Chronic anticoagulation 08/22/2013  .  Essential hypertension 08/22/2013  . Pulmonary HTN 06/18/2013  . Functional asplenia   . Vitamin D deficiency 02/13/2013  . Hx of pulmonary embolus 06/29/2012  . Hemochromatosis 12/14/2011  . Avascular necrosis [facility])     Past Surgical History:  Procedure Laterality Date  . CHOLECYSTECTOMY     01/2008  . Excision of left periauricular cyst     10/2009  . Excision of right ear lobe cyst with primary closur     11/2007  . Porta cath placement    . Porta cath removal    . PORTACATH PLACEMENT  01/05/2012   Procedure: INSERTION PORT-A-CATH;  Surgeon: Odis Hollingshead, MD;  Location: [facility];  Service: General;   Laterality: N/A;  ultrasound guiced port a cath insertion with fluoroscopy  . Right hip replacement     08/2006  . UMBILICAL HERNIA REPAIR     01/2008       Home Medications    Prior to Admission medications   Medication Sig Start Date End Date Taking? Authorizing Provider  ambrisentan (LETAIRIS) 5 MG tablet Take 5 mg by mouth at bedtime.   Yes Historical Provider, MD  aspirin 81 MG chewable tablet Chew 1 tablet (81 mg total) by mouth daily. 07/23/14  Yes [name], MD  Cholecalciferol (VITAMIN D) 2000 units tablet Take 1 tablet (2,000 Units total) by mouth daily. 02/23/15  Yes Dorena Dew, FNP  Deferasirox (JADENU) 360 MG TABS Take 1,080 mg by mouth daily.    Yes Historical Provider, MD  folic acid (FOLVITE) 1 MG tablet Take 1 tablet (1 mg total) by mouth every morning. 02/23/15  Yes Dorena Dew, FNP  gabapentin (NEURONTIN) 300 MG capsule Take 1 capsule (300 mg total) by mouth 3 (three) times daily. 06/01/15  Yes Tresa Garter, MD  HYDROmorphone (DILAUDID) 4 MG tablet Take 1 tablet (4 mg total) by mouth every 4 (four) hours as needed for severe pain. 11/28/15 12/13/15 Yes Olugbemiga Essie Christine, MD  hydroxyurea (HYDREA) 500 MG capsule Take 1,000 mg by mouth daily. May take with food to minimize GI side effects.   Yes Historical Provider, MD  lisinopril (PRINIVIL,ZESTRIL) 5 MG tablet Take 1 tablet (5 mg total) by mouth daily. 08/04/15  Yes [name], MD  morphine (MS CONTIN) 30 MG 12 hr tablet Take 1 tablet (30 mg total) by mouth every 12 (twelve) hours. 12/06/15 01/05/16 Yes Tresa Garter, MD  rivaroxaban (XARELTO) 20 MG TABS tablet Take 20 mg by mouth daily.    Yes Historical Provider, MD  torsemide (DEMADEX) 20 MG tablet Take 20 mg by mouth daily as needed. For increase in patient weight greater than 3 lbs. 07/27/15 07/26/16 Yes Historical Provider, MD  zolpidem (AMBIEN) 10 MG tablet Take 1 tablet (10 mg total) by mouth at bedtime as needed for sleep. 12/06/15  01/05/16 Yes Tresa Garter, MD    Family History Family History  Problem Relation Age of Onset  . Sickle cell trait Mother   . Depression Mother   . Diabetes Mother   . Sickle cell trait Father   . Sickle cell trait Brother     Social History Social History  Substance Use Topics  . Smoking status: Former Smoker    Packs/day: 0.50    Years: 10.00    Types: Cigarettes    Quit date: 05/29/2011  . Smokeless tobacco: Never Used  . Alcohol use No     Allergies   Review of patient's allergies indicates  no known allergies.   Review of Systems Review of Systems A complete 10 system review of systems was obtained and all systems are negative except as noted in the HPI and PMH.   Physical Exam Updated Vital Signs BP 105/70 (BP Location: Left Arm)   Pulse 85   Temp 98.7 F (37.1 C) (Oral)   Resp 20   SpO2 99%   Physical Exam  Constitutional: He is oriented to person, place, and time. Vital signs are normal. He appears well-developed and well-nourished.  Non-toxic appearance. He does not appear ill. No distress.  HENT:  Head: Normocephalic and atraumatic.  Nose: Nose normal.  Mouth/Throat: Oropharynx is clear and moist. No oropharyngeal exudate.  Eyes: Conjunctivae and EOM are normal. Pupils are equal, round, and reactive to light. Scleral icterus is present.  Neck: Normal range of motion. Neck supple. No tracheal deviation, no edema, no erythema and normal range of motion present. No thyroid mass and no thyromegaly present.  Cardiovascular: Normal rate, regular rhythm, S1 normal, S2 normal, normal heart sounds, intact distal pulses and normal pulses.  Exam reveals no gallop and no friction rub.   No murmur heard. Pulmonary/Chest: Effort normal and breath sounds normal. No respiratory distress. He has no wheezes. He has no rhonchi. He has no rales.  Abdominal: Soft. Normal appearance and bowel sounds are normal. He exhibits no distension, no ascites and no mass. There is  no hepatosplenomegaly. There is no tenderness. There is no rebound, no guarding and no CVA tenderness.  Musculoskeletal: Normal range of motion. He exhibits no edema or tenderness.  Lymphadenopathy:    He has no cervical adenopathy.  Neurological: He is alert and oriented to person, place, and time. He has normal strength. No cranial nerve deficit or sensory deficit.  Skin: Skin is warm, dry and intact. No petechiae and no rash noted. He is not diaphoretic. No erythema. No pallor.  Nursing note and vitals reviewed.    ED Treatments / Results  DIAGNOSTIC STUDIES: Oxygen Saturation is 99% on RA, nl by my interpretation.    COORDINATION OF CARE: 2:43 AM Discussed treatment plan with pt at bedside which includes labs and pt agreed to plan.   Labs (all labs ordered are listed, but only abnormal results are displayed) Labs Reviewed  COMPREHENSIVE METABOLIC PANEL - Abnormal; Notable for the following:       Result Value   Glucose, Bld 101 (*)    Calcium 8.6 (*)    Total Bilirubin 5.9 (*)    All other components within normal limits  CBC WITH DIFFERENTIAL/PLATELET - Abnormal; Notable for the following:    WBC 11.3 (*)    RBC 2.50 (*)    Hemoglobin 7.5 (*)    HCT 22.6 (*)    RDW 20.1 (*)    Platelets 453 (*)    All other components within normal limits  RETICULOCYTES - Abnormal; Notable for the following:    Retic Ct Pct 7.9 (*)    RBC. 2.50 (*)    Retic Count, Manual 197.5 (*)    All other components within normal limits    EKG  EKG Interpretation None       Radiology No results found.  Procedures Procedures (including critical care time)  Medications Ordered in ED Medications - No data to display   Initial Impression / Assessment and Plan / ED Course  I have reviewed the triage vital signs and the nursing notes.  Pertinent labs & imaging results that were available  during my care of the patient were reviewed by me and considered in my medical decision making  (see chart for details).  Clinical Course    Patient presents to the ED for sickle cell pain.  He is here 4-5 times per month for the past 1.5 years.  Labs are unremarkable for any acute changes.  He was given toradol and dilaudid for pain control. He requests benadryl as well which was given oral.  5:44 AM Patient was given 2nd and 3rd doses but is still in pain.  Will admit to hospitalist for further care.  Final Clinical Impressions(s) / ED Diagnoses   Final diagnoses:  None    New Prescriptions New Prescriptions   No medications on file     I personally performed the services described in this documentation, which was scribed in my presence. The recorded information has been reviewed and is accurate.       Everlene Balls, MD 12/04/15 [phone]

## 2015-12-04 NOTE — Discharge Instructions (Signed)
Information on my medicine - XARELTO (rivaroxaban)  This medication education was reviewed with me or my healthcare representative as part of my discharge preparation.  The pharmacist that spoke with me during my hospital stay was:  Sallyanne Havers, [place]? Xarelto was prescribed to treat blood clots that may have been found in the veins of your legs (deep vein thrombosis) or in your lungs (pulmonary embolism) and to reduce the risk of them occurring again.  What do you need to know about Xarelto? Take one 20 mg tablet by mouth ONCE A DAY with your evening meal.  DO NOT stop taking Xarelto without talking to the health care provider who prescribed the medication.  Refill your prescription for 20 mg tablets before you run out.  After discharge, you should have regular check-up appointments with your healthcare provider that is prescribing your Xarelto.  In the future your dose may need to be changed if your kidney function changes by a significant amount.  What do you do if you miss a dose?  If you are taking Xarelto ONCE DAILY and you miss a dose, take it as soon as you remember on the same day then continue your regularly scheduled once daily regimen the next day. Do not take two doses of Xarelto at the same time.   Important Safety Information Xarelto is a blood thinner medicine that can cause bleeding. You should call your healthcare provider right away if you experience any of the following: ? Bleeding from an injury or your nose that does not stop. ? Unusual colored urine (red or dark brown) or unusual colored stools (red or black). ? Unusual bruising for unknown reasons. ? A serious fall or if you hit your head (even if there is no bleeding).  Some medicines may interact with Xarelto and might increase your risk of bleeding while on Xarelto. To help avoid this, consult your healthcare provider or pharmacist prior to using any new  prescription or non-prescription medications, including herbals, vitamins, non-steroidal anti-inflammatory drugs (NSAIDs) and supplements.  This website has more information on Xarelto: [email or web address]

## 2015-12-04 NOTE — H&P (Addendum)
History and Physical    [name] [id number] DOB: September 03, 1980 DOA: 12/04/2015  Referring MD/NP/PA:   PCP: [name], MD   Patient coming from:  The patient is coming from home.  At baseline, pt is independent for most of ADL.   Chief Complaint: Bilateral rib cage pain and back pain   HPI: [name] is a 35 y.o. male with medical history significant of sickle cell disease, stroke with aphasia, acute chest syndrome, DVT,  PE on Xarelto, pulmonary hypertension, cor pulmonale, chronic respiratory failure, C diff colitis, who presents with bilateral rib cages and back pain.   Patient states that he has worsening pain since yesterday, which has been progressively getting worse. His pain is mainly located in the bilateral sides of his rib cages and back. It is constant, 8 out of 10 in severity, nonradiating. It is not aggravated or alleviated by any known factors. States that his home pain medication does not help anymore. He does not have chest pain or shortness of breath. No cough, no fever or chills. Patient denies nausea, vomiting, diarrhea, abdominal pain, symptoms of UTI or new unilateral weakness. He has chronic slurred speech and aphasia due to previous stroke. He has chronic slurred speech and aphasia from previous stroke.   ED Course: pt was found to have WBC 11.3, hemoglobin 7.5 which was 7.6 on 11/27/15, electrolytes renal function okay, temperature normal, no tachycardia, O2 saturation 96% on room air. Patient is placed on telemetry bed for observation.  Review of Systems:   General: no fevers, chills, no changes in body weight, has fatigue HEENT: no blurry vision, hearing changes or sore throat Respiratory: no dyspnea, coughing, wheezing CV: no chest pain, no palpitations GI: no nausea, vomiting, abdominal pain, diarrhea, constipation GU: no dysuria, burning on urination, increased urinary frequency, hematuria  Ext: no leg edema Neuro: no unilateral  weakness, numbness, or tingling, no vision change or hearing loss Skin: no rash, no skin tear. MSK: No muscle spasm, no deformity, no limitation of range of movement in spin. Has pain bilateral rib cages and back. Heme: No easy bruising.  Travel history: No recent long distant travel.  Allergy: No Known Allergies  Past Medical History:  Diagnosis Date  . Acute chest syndrome ([place]) 06/18/2013  . Acute embolism and thrombosis of right internal jugular vein ([place])   . Alcohol consumption of one to four drinks per day   . Avascular necrosis (HCC)    Right Hip  . Blood transfusion   . Chronic anticoagulation   . Demand ischemia ([place]) 01/02/2014  . Former smoker   . Functional asplenia   . Hb-SS disease with crisis ([place])   . History of Clostridium difficile infection   . History of pulmonary embolus (PE)   . Hypertension   . Hypokalemia   . Leukocytosis    Chronic  . Mood disorder ([place])   . Noncompliance with medication regimen   . Oxygen deficiency   . Pulmonary hypertension   . Second hand tobacco smoke exposure   . Sickle cell anemia (HCC)   . Sickle-cell crisis with associated acute chest syndrome ([place]) 05/13/2013  . Stroke ([place])   . Thrombocytosis (HCC)    Chronic  . Uses marijuana     Past Surgical History:  Procedure Laterality Date  . CHOLECYSTECTOMY     01/2008  . Excision of left periauricular cyst     10/2009  . Excision of right ear lobe cyst with primary closur  11/2007  . Porta cath placement    . Porta cath removal    . PORTACATH PLACEMENT  01/05/2012   Procedure: INSERTION PORT-A-CATH;  Surgeon: Odis Hollingshead, MD;  Location: [facility];  Service: General;  Laterality: N/A;  ultrasound guiced port a cath insertion with fluoroscopy  . Right hip replacement     08/2006  . UMBILICAL HERNIA REPAIR     01/2008    Social History:  reports that he quit smoking about 4 years ago. His smoking use included Cigarettes. He has a 5.00 pack-year smoking history. He has  never used smokeless tobacco. He reports that he does not drink alcohol or use drugs.  Family History:  Family History  Problem Relation Age of Onset  . Sickle cell trait Mother   . Depression Mother   . Diabetes Mother   . Sickle cell trait Father   . Sickle cell trait Brother      Prior to Admission medications   Medication Sig Start Date End Date Taking? Authorizing Provider  ambrisentan (LETAIRIS) 5 MG tablet Take 5 mg by mouth at bedtime.   Yes Historical Provider, MD  aspirin 81 MG chewable tablet Chew 1 tablet (81 mg total) by mouth daily. 07/23/14  Yes [name], MD  Cholecalciferol (VITAMIN D) 2000 units tablet Take 1 tablet (2,000 Units total) by mouth daily. 02/23/15  Yes Dorena Dew, FNP  Deferasirox (JADENU) 360 MG TABS Take 1,080 mg by mouth daily.    Yes Historical Provider, MD  folic acid (FOLVITE) 1 MG tablet Take 1 tablet (1 mg total) by mouth every morning. 02/23/15  Yes Dorena Dew, FNP  gabapentin (NEURONTIN) 300 MG capsule Take 1 capsule (300 mg total) by mouth 3 (three) times daily. 06/01/15  Yes Tresa Garter, MD  HYDROmorphone (DILAUDID) 4 MG tablet Take 1 tablet (4 mg total) by mouth every 4 (four) hours as needed for severe pain. 11/28/15 12/13/15 Yes Olugbemiga Essie Christine, MD  hydroxyurea (HYDREA) 500 MG capsule Take 1,000 mg by mouth daily. May take with food to minimize GI side effects.   Yes Historical Provider, MD  lisinopril (PRINIVIL,ZESTRIL) 5 MG tablet Take 1 tablet (5 mg total) by mouth daily. 08/04/15  Yes [name], MD  morphine (MS CONTIN) 30 MG 12 hr tablet Take 1 tablet (30 mg total) by mouth every 12 (twelve) hours. 12/06/15 01/05/16 Yes Tresa Garter, MD  rivaroxaban (XARELTO) 20 MG TABS tablet Take 20 mg by mouth daily.    Yes Historical Provider, MD  torsemide (DEMADEX) 20 MG tablet Take 20 mg by mouth daily as needed. For increase in patient weight greater than 3 lbs. 07/27/15 07/26/16 Yes Historical Provider, MD    zolpidem (AMBIEN) 10 MG tablet Take 1 tablet (10 mg total) by mouth at bedtime as needed for sleep. 12/06/15 01/05/16 Yes Tresa Garter, MD    Physical Exam: Vitals:   12/03/15 2310 12/04/15 0231 12/04/15 0342 12/04/15 0547  BP: 105/70 104/72 108/70 116/87  Pulse: 85 67 71 73  Resp: 20 23 21 17   Temp: 98.7 F (37.1 C)  98.7 F (37.1 C)   TempSrc: Oral  Oral   SpO2: 99% 96% 96% 92%   General: Not in acute distress HEENT:       Eyes: PERRL, EOMI, no scleral icterus.       ENT: No discharge from the ears and nose, no pharynx injection, no tonsillar enlargement.  Neck: No JVD, no bruit, no mass felt. Heme: No neck lymph node enlargement. Cardiac: S1/S2, RRR, No murmurs, No gallops or rubs. Respiratory: No rales, wheezing, rhonchi or rubs. GI: Soft, nondistended, nontender, no rebound pain, no organomegaly, BS present. GU: No hematuria Ext: No pitting leg edema bilaterally. 2+DP/PT pulse bilaterally. Musculoskeletal: No joint deformities, No joint redness or warmth, no limitation of ROM in spin. Has diffused tenderness over bilateral rib cages Skin: No rashes.  Neuro: Alert, oriented X3, cranial nerves II-XII grossly intact, has slurred speech. moves all extremities. Psych: Patient is not psychotic, no suicidal or hemocidal ideation.  Labs on Admission: I have personally reviewed following labs and imaging studies  CBC:  Recent Labs Lab 11/27/15 2252 12/04/15 0144  WBC 11.3* 11.3*  NEUTROABS 7.4 7.5  HGB 7.6* 7.5*  HCT 23.3* 22.6*  MCV 90.7 90.4  PLT 620* 0000000*   Basic Metabolic Panel:  Recent Labs Lab 11/27/15 2252 12/04/15 0144  NA 140 138  K 3.6 3.9  CL 109 110  CO2 26 22  GLUCOSE 118* 101*  BUN 13 13  CREATININE 0.84 0.81  CALCIUM 9.0 8.6*   GFR: Estimated Creatinine Clearance: 138.4 mL/min (by C-G formula based on SCr of 0.81 mg/dL). Liver Function Tests:  Recent Labs Lab 11/27/15 2252 12/04/15 0144  AST 38 27  ALT 28 19  ALKPHOS 77 70   BILITOT 4.5* 5.9*  PROT 7.5 7.1  ALBUMIN 4.3 4.1   No results for input(s): LIPASE, AMYLASE in the last 168 hours. No results for input(s): AMMONIA in the last 168 hours. Coagulation Profile: No results for input(s): INR, PROTIME in the last 168 hours. Cardiac Enzymes:  Recent Labs Lab 11/28/15 0307 11/28/15 0940 11/28/15 1438  TROPONINI <0.03 0.01 <0.03   BNP (last 3 results) No results for input(s): PROBNP in the last 8760 hours. HbA1C: No results for input(s): HGBA1C in the last 72 hours. CBG: No results for input(s): GLUCAP in the last 168 hours. Lipid Profile: No results for input(s): CHOL, HDL, LDLCALC, TRIG, CHOLHDL, LDLDIRECT in the last 72 hours. Thyroid Function Tests: No results for input(s): TSH, T4TOTAL, FREET4, T3FREE, THYROIDAB in the last 72 hours. Anemia Panel:  Recent Labs  12/04/15 0144  RETICCTPCT 7.9*   Urine analysis:    Component Value Date/Time   COLORURINE YELLOW 11/22/2015 [place] 11/22/2015 0718   LABSPEC 1.013 11/22/2015 0718   PHURINE 6.5 11/22/2015 0718   GLUCOSEU NEGATIVE 11/22/2015 0718   HGBUR NEGATIVE 11/22/2015 0718   BILIRUBINUR NEGATIVE 11/22/2015 0718   KETONESUR NEGATIVE 11/22/2015 0718   PROTEINUR NEGATIVE 11/22/2015 0718   UROBILINOGEN 0.2 03/24/2015 1426   NITRITE NEGATIVE 11/22/2015 0718   LEUKOCYTESUR NEGATIVE 11/22/2015 0718   Sepsis Labs: @LABRCNTIP (procalcitonin:4,lacticidven:4) )No results found for this or any previous visit (from the past 240 hour(s)).   Radiological Exams on Admission: No results found.   EKG: Not done in ED, will get one.   Assessment/Plan Principal Problem:   Sickle cell pain crisis ([place]) Active Problems:   Hx of pulmonary embolus   Chronic anticoagulation   Essential hypertension   PAH (pulmonary artery hypertension)   Cor pulmonale, chronic (HCC)   Chronic atrial fibrillation (HCC)   Sickle cell anemia with pain (HCC)   Ischemic stroke (HCC)   Sickle  cell pain crisis ([place]): No CP. No acute chest syndrome clinically. Hemoglobin 7.5 which is stable. No indication to blood transfusion now.  -will place on tele bed for obs -continue folic  acid and hydroxyurea -continue Jadenu for secondary Hemochromatosis -IVF: D5-1/2NS at 125 cc/h -Zofran for nausea -Start high dose PCA protocol for pain: loading dose 1.5 mg, bolus dose 0.8 mg, lockout interval 10 min and one hour dose limit at 4.8 mg. - continue home MS Contin - will not start Toradol since pt is on Xarelto  Chronic respiratory failure secondary to pulmonary hypertension and Cor pulmonale:pt has no SOB.  -prn Nasal cannula oxygen to maintain oxygen saturation -hold torsemide since patient needs IV fluid for sickle cell pain crisis -continue letairis  Hx of pulmonary embolus and DVT: -continue Xarelto   Hx of stroke: has aphasia. -on Xarelto  -continue ASA  DVT ppx: on Xarelto Code Status: Full code Family Communication: None at bed side.   Disposition Plan:  Anticipate discharge back to previous home environment Consults called:  none Admission status: Obs / tele    Date of Service 12/04/2015    Ivor Costa [facility] Pager [phone]  If 7PM-7AM, please contact night-coverage [email or web address] Password [email or web address] 12/04/2015, 6:16 AM

## 2015-12-04 NOTE — Progress Notes (Signed)
Patient ID: [name], male   DOB: 1980-07-25, 35 y.o.   MRN: [id number]  Pt admitted this morning with sickle cell crisis. He reports pain in ribs and at a level of 8/10. Will adjust PCA to dose of 0.7 mg bolus, add scheduled clinician assisted doses and add Toradol and pepcid.   Pt has no ectopy on telemetry and no compelling reason for telemetry so will discontinue Telemetry and transfer to non-tele floor.  [name]

## 2015-12-04 NOTE — ED Notes (Signed)
Offered patient subcutaneous triage protocol pain medication.  Pt refused.

## 2015-12-05 DIAGNOSIS — D5700 Hb-SS disease with crisis, unspecified: Principal | ICD-10-CM

## 2015-12-05 NOTE — Progress Notes (Signed)
Subjective: A 35 year old gentleman admitted yesterday with sickle cell painful crisis. Patient still reports an 8 out of 10 pain in his legs and back. He is on Dilaudid PCA with Toradol and physician assistant docent of 2 mg every 3 hours as needed. He complains that the pain is still there. No fever or chills and no nausea vomiting or diarrhea. He has been able to get out of bed but not move around.  Objective: Vital signs in last 24 hours: Temp:  [98 F (36.7 C)-98.6 F (37 C)] 98.5 F (36.9 C) (11/04 0529) Pulse Rate:  [83-94] 92 (11/04 0529) Resp:  [13-19] 17 (11/04 0800) BP: (108-120)/(63-86) 108/63 (11/04 1010) SpO2:  [97 %-100 %] 99 % (11/04 0800) FiO2 (%):  [39 %-41 %] 39 % (11/04 0502) Weight:  [82.8 kg (182 lb 9.6 oz)] 82.8 kg (182 lb 9.6 oz) (11/03 1809) Weight change:  Last BM Date: 12/04/15  Intake/Output from previous day: 11/03 0701 - 11/04 0700 In: 3754.6 [P.O.:840; I.V.:2914.6] Out: 1250 [Urine:1250] Intake/Output this shift: Total I/O In: 400 [P.O.:400] Out: 400 [Urine:400]  General appearance: alert, cooperative and no distress Neck: no adenopathy, no carotid bruit, no JVD, supple, symmetrical, trachea midline and thyroid not enlarged, symmetric, no tenderness/mass/nodules Back: symmetric, no curvature. ROM normal. No CVA tenderness. Resp: clear to auscultation bilaterally Chest wall: no tenderness Cardio: regular rate and rhythm, S1, S2 normal, no murmur, click, rub or gallop GI: soft, non-tender; bowel sounds normal; no masses,  no organomegaly Extremities: extremities normal, atraumatic, no cyanosis or edema Pulses: 2+ and symmetric Skin: Skin color, texture, turgor normal. No rashes or lesions Neurologic: Grossly normal  Lab Results:  Recent Labs  12/04/15 0144  WBC 11.3*  HGB 7.5*  HCT 22.6*  PLT 453*   BMET  Recent Labs  12/04/15 0144  NA 138  K 3.9  CL 110  CO2 22  GLUCOSE 101*  BUN 13  CREATININE 0.81  CALCIUM 8.6*     Studies/Results: No results found.  Medications: I have reviewed the patient's current medications.  Assessment/Plan: A 35 Yo admitted with Sickle cell painful crisis.  #1 sickle cell painful crisis:patient will be maintain on his current Dilaudid PCA with Toradol. Will continue with physician assistant dosing. Continue close monitoring.  #2 sickle cell anemia: patient's hemoglobin is7.5 closed to baseline. He has significant pulmonary hypertension and therefore will need relatively higher hemoglobin A components for oxygen carrying capacity. At this point there is no requirement for exchange transfusion.  #3 pulmonary hypertension:continue home medications and oxygen.  #4 history of pulmonary embolism: continue Xarelto.  #5 paroxysmal atrial fibrillation: rate is controlled and patient is on Xarelto.  #6 Leukocytosis: due to vaso-occlusive crisis.continue monitoring.   LOS: 1 day   Kellen Hover,[name] 12/05/2015, 11:19 AM

## 2015-12-06 LAB — RETICULOCYTES
RBC.: 2.1 MIL/uL — ABNORMAL LOW (ref 4.22–5.81)
Retic Count, Absolute: 100.8 10*3/uL (ref 19.0–186.0)
Retic Ct Pct: 4.8 % — ABNORMAL HIGH (ref 0.4–3.1)

## 2015-12-06 LAB — CBC WITH DIFFERENTIAL/PLATELET
BASOS PCT: 1 %
Basophils Absolute: 0.1 10*3/uL (ref 0.0–0.1)
EOS PCT: 6 %
Eosinophils Absolute: 0.7 10*3/uL (ref 0.0–0.7)
HEMATOCRIT: 18.7 % — AB (ref 39.0–52.0)
Hemoglobin: 6.3 g/dL — CL (ref 13.0–17.0)
LYMPHS PCT: 16 %
Lymphs Abs: 2 10*3/uL (ref 0.7–4.0)
MCH: 30 pg (ref 26.0–34.0)
MCHC: 33.7 g/dL (ref 30.0–36.0)
MCV: 89 fL (ref 78.0–100.0)
MONO ABS: 1.5 10*3/uL — AB (ref 0.1–1.0)
MONOS PCT: 12 %
NEUTROS ABS: 7.8 10*3/uL — AB (ref 1.7–7.7)
Neutrophils Relative %: 65 %
PLATELETS: 314 10*3/uL (ref 150–400)
RBC: 2.1 MIL/uL — ABNORMAL LOW (ref 4.22–5.81)
RDW: 19.5 % — AB (ref 11.5–15.5)
WBC: 12 10*3/uL — ABNORMAL HIGH (ref 4.0–10.5)

## 2015-12-06 NOTE — Progress Notes (Signed)
Subjective: Patient is doing better today. He is breathing better but hemoglobin has dropped to 6.3. His pain remains at 7 out of 10 in his legs and back. Denied any fever or chills no nausea vomiting or diarrhea.  Objective: Vital signs in last 24 hours: Temp:  [98.1 F (36.7 C)-98.5 F (36.9 C)] 98.4 F (36.9 C) (11/05 0600) Pulse Rate:  [78-98] 78 (11/05 0600) Resp:  [13-18] 14 (11/05 0800) BP: (108-129)/(63-92) 109/67 (11/05 0600) SpO2:  [93 %-99 %] 98 % (11/05 0800) FiO2 (%):  [28 %-32 %] 28 % (11/05 0800) Weight:  [84.8 kg (187 lb)] 84.8 kg (187 lb) (11/05 0600) Weight change: 7.711 kg (17 lb) Last BM Date: 12/05/15  Intake/Output from previous day: 11/04 0701 - 11/05 0700 In: 3766.2 [P.O.:755; I.V.:3011.2] Out: 1100 [Urine:1100] Intake/Output this shift: Total I/O In: 355 [P.O.:355] Out: -   General appearance: alert, cooperative and no distress Neck: no adenopathy, no carotid bruit, no JVD, supple, symmetrical, trachea midline and thyroid not enlarged, symmetric, no tenderness/mass/nodules Back: symmetric, no curvature. ROM normal. No CVA tenderness. Resp: clear to auscultation bilaterally Chest wall: no tenderness Cardio: regular rate and rhythm, S1, S2 normal, no murmur, click, rub or gallop GI: soft, non-tender; bowel sounds normal; no masses,  no organomegaly Extremities: extremities normal, atraumatic, no cyanosis or edema Pulses: 2+ and symmetric Skin: Skin color, texture, turgor normal. No rashes or lesions Neurologic: Grossly normal  Lab Results:  Recent Labs  12/04/15 0144 12/06/15 0817  WBC 11.3* 12.0*  HGB 7.5* 6.3*  HCT 22.6* 18.7*  PLT 453* 314   BMET  Recent Labs  12/04/15 0144  NA 138  K 3.9  CL 110  CO2 22  GLUCOSE 101*  BUN 13  CREATININE 0.81  CALCIUM 8.6*    Studies/Results: No results found.  Medications: I have reviewed the patient's current medications.  Assessment/Plan: A 35 Yo admitted with Sickle cell painful  crisis.  #1 sickle cell painful crisis: Patient still in crisis mode. Patient will be maintained on his current Dilaudid PCA with Toradol. Will continue with physician assistant dosing. Continue close monitoring.  #2 Sickle cell anemia: patient's hemoglobin is now 6.3g He has significant pulmonary hypertension and therefore will need relatively higher hemoglobin A components for oxygen carrying capacity. At this point however, he is not having any respiratory difficulties and his Oxygen saturation remains 98 % on 2L so I will hold up on any transfusion.  #3 pulmonary hypertension:continue home medications and oxygen.  #4 history of pulmonary embolism: continue Xarelto.  #5 paroxysmal atrial fibrillation: rate is controlled and patient is on Xarelto.  #6 Leukocytosis: due to vaso-occlusive crisis.continue monitoring.   LOS: 2 days   Maud Rubendall,[name] 12/06/2015, 9:38 AM

## 2015-12-06 NOTE — Progress Notes (Signed)
RN offered K-pad to pt, pt refuses, states does not want one.

## 2015-12-06 NOTE — Progress Notes (Signed)
CRITICAL VALUE ALERT  Critical value received:  HGb 6.3  Date of notification:  12/06/2015   Time of notification:  9:13 AM   Critical value read back:Yes.    Nurse who received alert:  Joaquin Courts   MD notified (1st page):  DR Jonelle Sidle  Time of first page:  9:14 AM   MD notified (2nd page):  Time of second page:  Responding MD:Dr Jonelle Sidle  Time MD responded:  [phone]

## 2015-12-06 NOTE — Progress Notes (Signed)
Patient refused SCD's. Teaching provided concerning the need and use of SCD's. Patient acknowledged teaching and continued not to use SCD's

## 2015-12-07 DIAGNOSIS — Z86711 Personal history of pulmonary embolism: Secondary | ICD-10-CM

## 2015-12-07 DIAGNOSIS — I2721 Secondary pulmonary arterial hypertension: Secondary | ICD-10-CM

## 2015-12-07 DIAGNOSIS — R0902 Hypoxemia: Secondary | ICD-10-CM

## 2015-12-07 DIAGNOSIS — I10 Essential (primary) hypertension: Secondary | ICD-10-CM

## 2015-12-07 DIAGNOSIS — Z7901 Long term (current) use of anticoagulants: Secondary | ICD-10-CM

## 2015-12-07 IMAGING — CR DG CHEST 2V
2 series · 2 of 2 positions shown · non-contrast
Comparison: 01/03/2013, 11/13/2012, 01/14/2012.

CLINICAL DATA: Chest pain.  Sickle cell crisis.

EXAM:
CHEST  2 VIEW

[w chest pa]
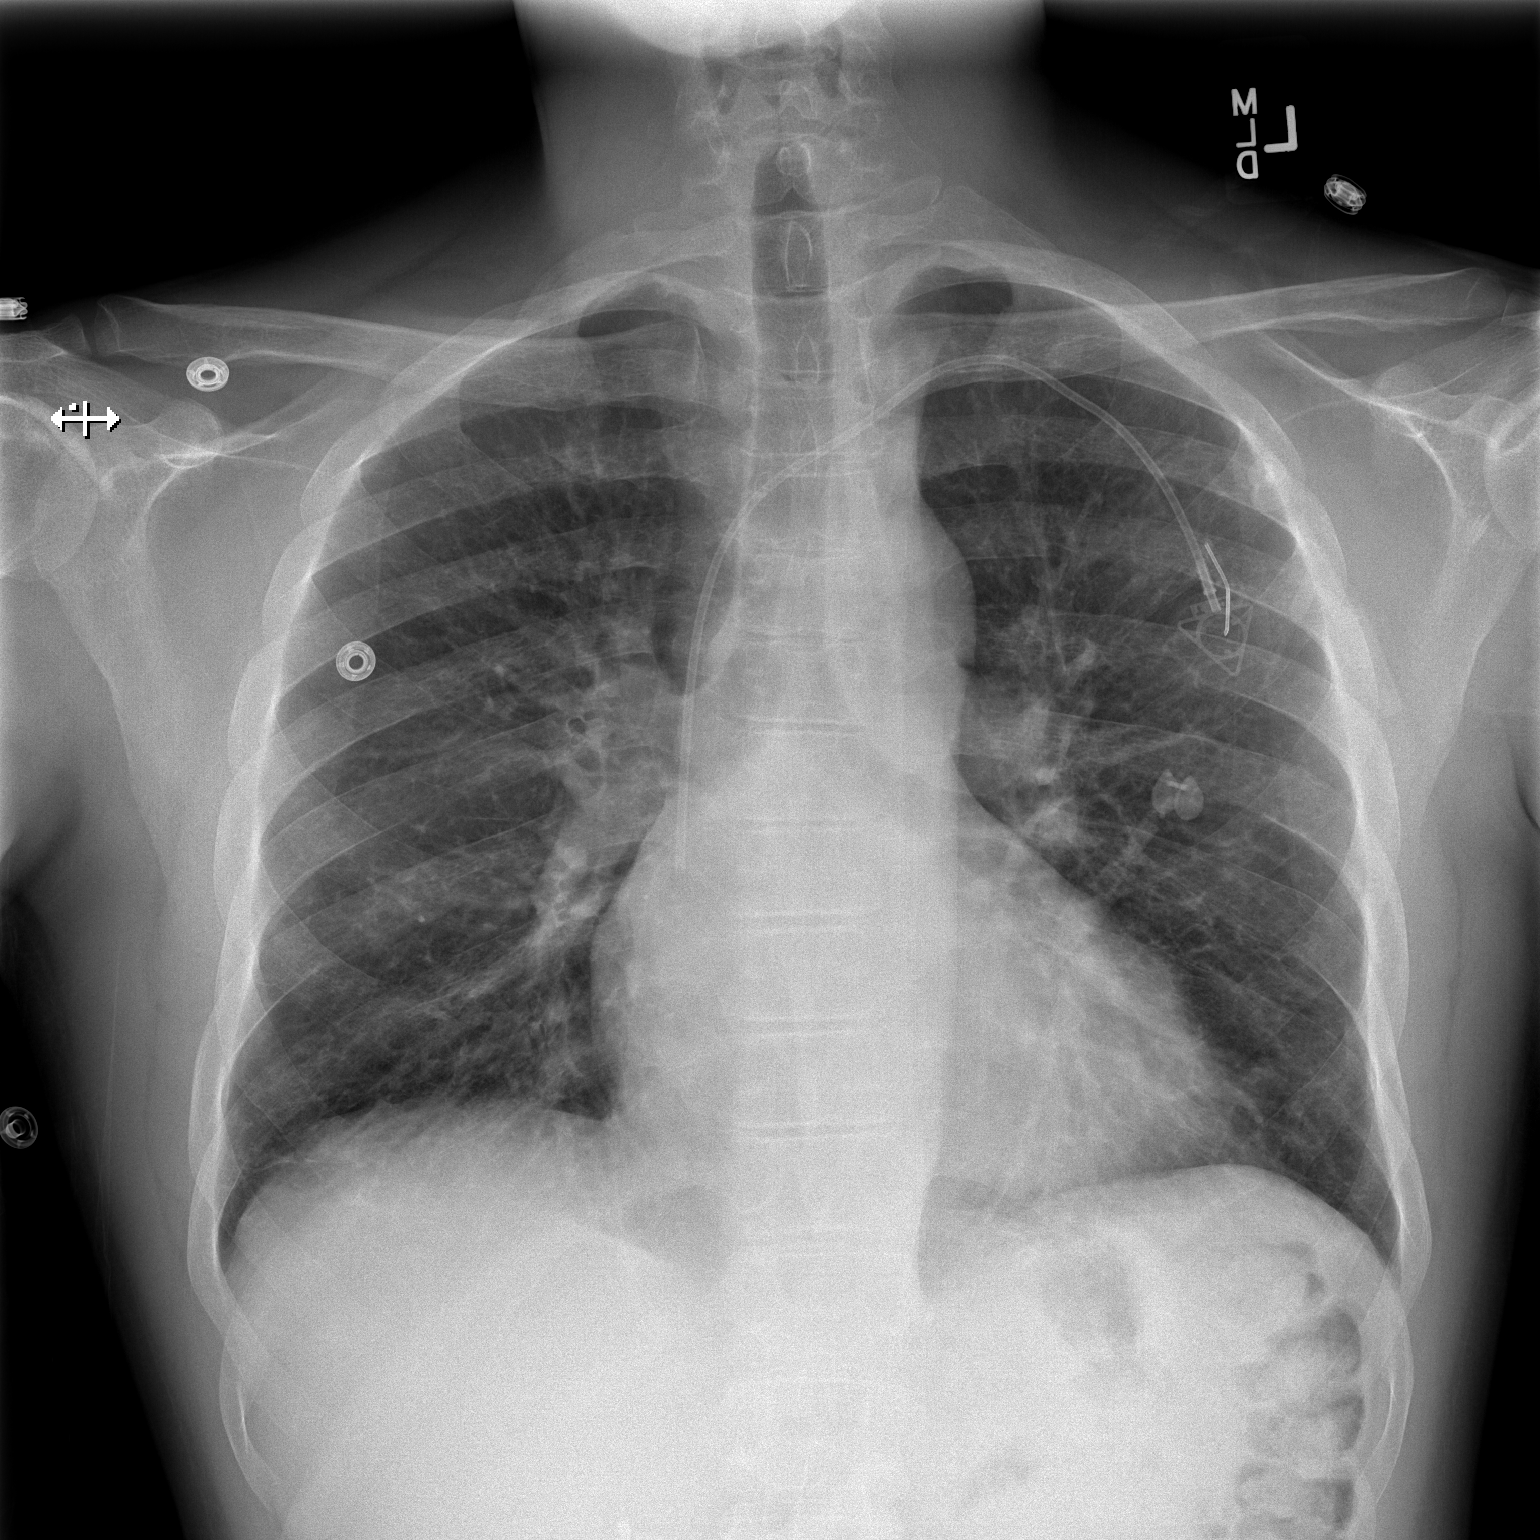

[w chest lat]
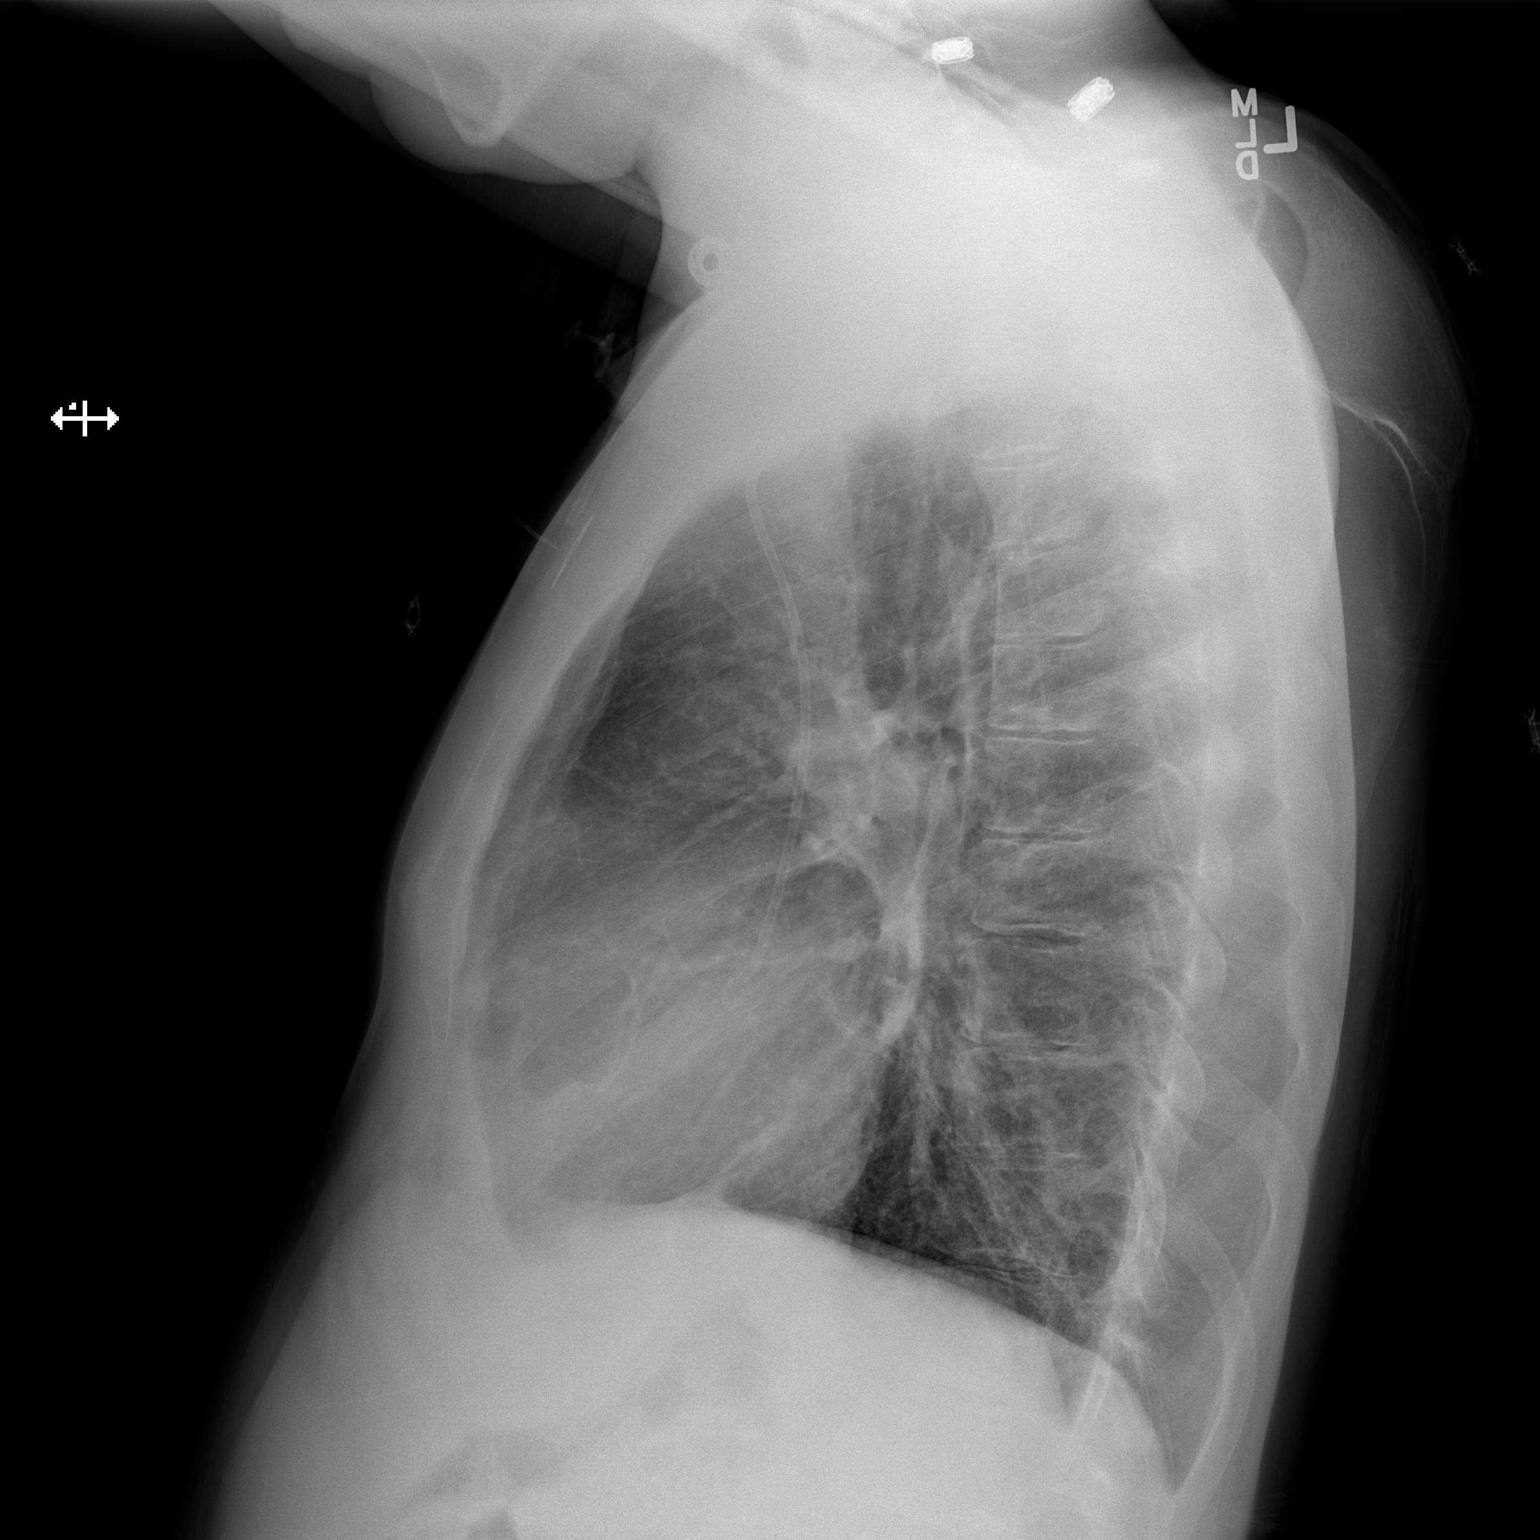

[2 of 2 positions shown; findings below may reference images not displayed]

FINDINGS: Cardiac silhouette mildly enlarged but stable. Hilar and mediastinal
contours otherwise unremarkable. Prominent bronchovascular markings
diffusely and mild central peribronchial thickening, unchanged.
Linear scarring in the lower lobes, unchanged. Answer lungs
otherwise clear left subclavian Port-A-Cath tip in the lower SVC at
the cavoatrial junction.
IMPRESSION: No acute cardiopulmonary disease. Stable mild changes of chronic
bronchitis and/or asthma and stable scarring in the lower lobes.
Stable mild cardiomegaly.

## 2015-12-07 MED ORDER — HYDROMORPHONE HCL 4 MG PO TABS
4.0000 mg | ORAL_TABLET | ORAL | Status: DC
  Administered 2015-12-07 – 2015-12-09 (×13): 4 mg via ORAL
  Filled 2015-12-07 (×14): qty 1

## 2015-12-07 MED ORDER — SUMATRIPTAN SUCCINATE 50 MG PO TABS
50.0000 mg | ORAL_TABLET | Freq: Once | ORAL | Status: AC
  Administered 2015-12-07: 50 mg via ORAL
  Filled 2015-12-07: qty 1

## 2015-12-07 MED ORDER — TORSEMIDE 20 MG PO TABS
20.0000 mg | ORAL_TABLET | Freq: Once | ORAL | Status: AC
  Administered 2015-12-07: 20 mg via ORAL
  Filled 2015-12-07: qty 1

## 2015-12-07 NOTE — Progress Notes (Addendum)
Room air ambulatory sats 83%. Pt sats 94% on 2L of O2.

## 2015-12-07 NOTE — Progress Notes (Signed)
Pharmacy IV to PO conversion  The patient is receiving diphenhydramine by the intravenous route.  Based on the following criteria approved by the Pharmacy and [place], diphenhydramine is being converted to the equivalent oral dose form.   Not prescribed to treat or prevent a severe allergic reaction   Not prescribed as premedication prior to receiving blood product, biologic medication, antimicrobial, or chemotherapy agent   The patient has tolerated at least one dose of an oral or enteral medication   The patient has no evidence of active gastrointestinal bleeding or impaired GI absorption (gastrectomy, short bowel, patient on TNA or NPO).   The patient is not undergoing procedural sedation  If you have any questions about this conversion, please contact the Pharmacy Department (ext [phone]).  Thank you.  Reuel Boom, PharmD Pager: [phone] 12/07/2015, 10:03 AM

## 2015-12-07 NOTE — Progress Notes (Signed)
SICKLE CELL SERVICE PROGRESS NOTE  [name] [id number] DOB: 02-06-1980 DOA: 12/04/2015 PCP: [name], MD  Assessment/Plan: Principal Problem:   Sickle cell pain crisis ([place]) Active Problems:   Cor pulmonale, chronic (HCC)   Chronic anticoagulation   Hb-SS disease with crisis ([place])   Hx of pulmonary embolus   Essential hypertension   PAH (pulmonary artery hypertension)   Chronic atrial fibrillation (HCC)   Sickle cell anemia with pain ([place])   Ischemic stroke ([place])  1. Hb SS with crisis: Pt continue to have pain at 7/10 which is the intensity of which he can manage with oral analgesics. I will schedule his Dilaudid and discontinue clinician assisted dose and continue Toradol.  2. Anemia of Chronic Disease: His Hb, reticulocytes and platelets have decreased. Reticulocytosis is not adequate for level of anemia. Will  Hold Hydrea and re-check labs tomorrow, I no improvement in reticulocytes will transfuse 1 unit of blood. He missed an appointment for red cell pheresis on 11/1 and it has been rescheduled for this week. He does not know the date of his re-scheduled exchange. Will try to obtain information from [facility]. 3. Hypoxia: Pt still requiring 2 L/min to maintain saturations above 90%.  4. Leukocytosis: Pt has no evidence of infection. Likely related to crisis.  5. S/P CVA: Pt seems to have some residual effects of CVA with regard to cognition. Will consult SLP for cognitive evaluation.  6. Chronic pain: Continue MS Contin.   Code Status: Full Code Family Communication: N/A Disposition Plan: Not yet ready for discharge  [name].  Pager [phone]. If 7PM-7AM, please contact night-coverage.  12/07/2015, 3:26 PM  LOS: 3 days   Interim History: Pt reports pain at 7/10 and localized to ribs. He has used 37.98 mg with 60/55:demands/deliveries in addition to 18 mg in clinician assisted doses in the last 24 hours. Since his stroKe last month he seems to have  problems with memory.   Consultants:  None  Procedures:  None  Antibiotics:  None  Objective: Vitals:   12/07/15 0554 12/07/15 0800 12/07/15 1214 12/07/15 1327  BP: 100/60   133/79  Pulse: 89   (!) 102  Resp: 17 15 18 18   Temp: 99.2 F (37.3 C)   100 F (37.8 C)  TempSrc: Oral   Oral  SpO2: 100% 97% 98% 98%  Weight:      Height:       Weight change:   Intake/Output Summary (Last 24 hours) at 12/07/15 1526 Last data filed at 12/07/15 1501  Gross per 24 hour  Intake          2776.25 ml  Output             3150 ml  Net          -373.75 ml    General: Alert, awake, oriented x3, in no acute distress.  HEENT: [place]/AT PEERL, EOMI, mild icterus Neck: Trachea midline,  no masses, no thyromegal,y no JVD, no carotid bruit OROPHARYNX:  Moist, No exudate/ erythema/lesions.  Heart: Regular rate and rhythm.  Lungs: Clear to auscultation, no wheezing or rhonchi noted. No increased vocal fremitus resonant to percussion  Abdomen: Soft, nontender, nondistended, positive bowel sounds, no masses no hepatosplenomegaly noted.  Neuro: No focal neurological deficits noted cranial nerves II through XII grossly intact. Strength at baseline in bilateral upper and lower extremities. Musculoskeletal: No warmth swelling or erythema around joints, no spinal tenderness noted. Psychiatric: Patient alert and oriented x3, good insight and cognition,  good recent to remote recall.    Data Reviewed: Basic Metabolic Panel:  Recent Labs Lab 12/04/15 0144  NA 138  K 3.9  CL 110  CO2 22  GLUCOSE 101*  BUN 13  CREATININE 0.81  CALCIUM 8.6*   Liver Function Tests:  Recent Labs Lab 12/04/15 0144  AST 27  ALT 19  ALKPHOS 70  BILITOT 5.9*  PROT 7.1  ALBUMIN 4.1   No results for input(s): LIPASE, AMYLASE in the last 168 hours. No results for input(s): AMMONIA in the last 168 hours. CBC:  Recent Labs Lab 12/04/15 0144 12/06/15 0817  WBC 11.3* 12.0*  NEUTROABS 7.5 7.8*  HGB 7.5*  6.3*  HCT 22.6* 18.7*  MCV 90.4 89.0  PLT 453* 314   Cardiac Enzymes: No results for input(s): CKTOTAL, CKMB, CKMBINDEX, TROPONINI in the last 168 hours. BNP (last 3 results)  Recent Labs  08/02/15 2307 11/28/15 0520  BNP 503.2* 422.5*    ProBNP (last 3 results) No results for input(s): PROBNP in the last 8760 hours.  CBG: No results for input(s): GLUCAP in the last 168 hours.  Recent Results (from the past 240 hour(s))  MRSA PCR Screening     Status: None   Collection Time: 12/04/15  1:59 PM  Result Value Ref Range Status   MRSA by PCR NEGATIVE NEGATIVE Final    Comment:        The GeneXpert MRSA Assay (FDA approved for NASAL specimens only), is one component of a comprehensive MRSA colonization surveillance program. It is not intended to diagnose MRSA infection nor to guide or monitor treatment for MRSA infections.      Studies: Dg Chest 2 View  Result Date: 11/27/2015 CLINICAL DATA:  Sickle cell disease.  Chest pain for 2-3 days. EXAM: CHEST  2 VIEW COMPARISON:  Chest radiograph 11/23/2015 FINDINGS: There is a left chest wall Port-A-Cath with the tip in the proximal right atrium, unchanged. Unchanged cardiomegaly. No pulmonary edema. No focal airspace consolidation. No pleural effusion or pneumothorax. Unchanged hazy opacities and nodular density of the right mid lung. IMPRESSION: Unchanged cardiomegaly without overt pulmonary edema. Unchanged hazy opacities in the right mid lung. Electronically Signed   By: Ulyses Jarred M.D.   On: 11/27/2015 23:05   Dg Chest 2 View  Result Date: 11/23/2015 CLINICAL DATA:  Bilateral side pain for 1 week with hypoxia. EXAM: CHEST  2 VIEW COMPARISON:  11/22/2015 FINDINGS: The lungs are clear wiithout focal pneumonia, edema, pneumothorax or pleural effusion. The cardio pericardial silhouette is enlarged. There is pulmonary vascular congestion without overt pulmonary edema. Nodular areas in the peripheral aspect of the right mid lung  were seen on CT scan from 2 weeks ago. Left Port-A-Cath tip projects at the SVC/RA junction. Telemetry leads overlie the chest. IMPRESSION: Cardiomegaly with pulmonary vascular congestion. Nodular density right mid lung better seen on recent CT scan. Electronically Signed   By: Misty Stanley M.D.   On: 11/23/2015 10:34   Ct Head Wo Contrast  Result Date: 11/10/2015 CLINICAL DATA:  Left-sided weakness. EXAM: CT HEAD WITHOUT CONTRAST TECHNIQUE: Contiguous axial images were obtained from the base of the skull through the vertex without intravenous contrast. COMPARISON:  None. FINDINGS: Brain: No evidence of acute infarction, hemorrhage, extra-axial collection, ventriculomegaly, or mass effect. Old left basal ganglia 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 Generalized cerebral atrophy. Periventricular white matter low attenuation likely secondary to microangiopathy. Vascular: No significant cerebral vascular atherosclerotic calcifications are noted. Skull: Negative for fracture or focal  lesion. Sinuses/Orbits: Visualized portions of the orbits are unremarkable. Visualized portions of the paranasal sinuses and mastoid air cells are unremarkable. Other: None. IMPRESSION: 1. No acute intracranial pathology. Electronically Signed   By: Kathreen Devoid   On: 11/10/2015 11:16   Ct Angio Chest Pe W/cm &/or Wo Cm  Result Date: 11/10/2015 CLINICAL DATA:  Left flank pain and lethargy. Difficulty walking and facial droop with slurred speech. Hypoxia. Shortness of breath. Sickle cell anemia. EXAM: CT ANGIOGRAPHY CHEST WITH CONTRAST TECHNIQUE: Multidetector CT imaging of the chest was performed using the standard protocol during bolus administration of intravenous contrast. Multiplanar CT image reconstructions and MIPs were obtained to evaluate the vascular anatomy. CONTRAST:  65 cc Isovue 370 COMPARISON:  Multiple exams, including 11/10/2015 chest radiograph, and CT of 11/30/2014 FINDINGS: Cardiovascular: Filling  defect compatible with acute pulmonary embolus in the right middle lobe, right lower lobe, and potentially left lower lobe signal told pulmonary vessels. Marked cardiomegaly with elongation of the heart, RV:LV ratio 1.5 (abnormally elevated). Small pericardial effusion. Enlarged main pulmonary artery at 4.5 cm transverse. Left Port-A-Cath noted, tip a in the right atrium. Mediastinum/Nodes: Mass or confluent nodularity in the anterior mediastinum including a 1.7 by 4.5 cm anterior mediastinal component on image 50/4, confluent lymph nodes were also present previously but somewhat less striking. Right hilar node 1.3 cm in short axis on image 57/4, previously 0.6 cm. A posterior right hilar node measures 1.4 cm in short axis on image 64/4, formerly 0.8 cm. Lungs/Pleura: Peripheral foraminal airspace opacity at the right lung apex is new possibly from pulmonary hemorrhage. There is bilateral primarily perihilar ground-glass opacities and patchy sub solid opacities in both lower lobes and to a lesser extent peripherally in the upper lobes. Likely a combination of edema and pulmonary hemorrhage along with scattered atelectasis. No significant pleural effusion. Upper Abdomen: Diffuse calcification of the spleen, similar to prior. 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 Endplate notching of the thoracic vertebra characteristic of sickle cell disease. Review of the MIP images confirms the above findings. IMPRESSION: 1. Acute pulmonary embolus is present in segmental branches of the right middle lobe and right lower lobe, and possibly the left lower lobe on today's exam. Positive for acute PE with CT evidence of right heart strain (RV/LV Ratio = 1.5) consistent with at least submassive (intermediate risk) PE. The presence of right heart strain has been associated with an increased risk of morbidity and mortality. Please activate Code PE by paging [phone]. 2. Stable marked  cardiomegaly. 3. Small pericardial effusion. 4. Enlarged main pulmonary artery at 4.5 cm transverse. 5. Patchy ground-glass and peripheral opacities in both lungs likely a combination of mild pulmonary edema, pulmonary hemorrhage, and scattered atelectasis. 6. Osseous findings of sickle cell disease. 7. There are enlarged mediastinal and right hilar lymph nodes, increased from prior. These may merit follow up in the nonacute setting to ensure resolution and exclude the possibility of entities such as lymphoproliferative disease. Critical Value/emergent results were called by telephone at the time of interpretation on 11/10/2015 at 1:06 pm to Dr. Threasa Beards BELFI , who verbally acknowledged these results. Electronically Signed   By: Van Clines M.D.   On: 11/10/2015 13:08   Mr Brain Wo Contrast  Result Date: 11/10/2015 CLINICAL DATA:  Sickle cell disease. Gait disturbance and left facial droop. EXAM: MRI HEAD WITHOUT CONTRAST TECHNIQUE: Multiplanar, multiecho pulse sequences of the brain and surrounding structures were obtained without intravenous contrast. COMPARISON:  Head CT same  day. FINDINGS: Brain: There are scattered acute infarctions. Small foci of acute infarction are seen within the left superior cerebellum and left posterior pons/midbrain junction. There is acute infarction within the left occipital cortex measuring about 2 in diameter. There is a small acute infarction within the left corpus callosum more cingulate gyrus. A few punctate acute infarctions are seen in the left frontoparietal cortex. No acute infarction affecting the right hemisphere. There are multiple old bilateral cerebellar infarctions. There are old small vessel infarctions affecting the thalami I and basal ganglia left more than right. No mass lesion, hemorrhage, hydrocephalus or extra-axial collection. Vascular: Major vessels at the base of the brain show flow. Skull and upper cervical spine: Negative except for chronic  marrow changes of sickle cell. Sinuses/Orbits: Chronic marrow changes of sickle cell. No inflammatory sinus disease. Orbits negative. Other: None significant IMPRESSION: Background pattern of old infarctions throughout the cerebellum and old lacunar infarctions affecting the thalami basal ganglia. Multiple acute infarctions including areas of involvement in the left cerebellum, left dorsal pons midbrain junction, left occipital lobe, the left corpus callosum or cingulate gyrus, and a few small punctate foci in the left frontoparietal cortex. Findings are most consistent with embolic disease from the heart or ascending aorta given the combination of anterior and posterior circulation infarctions. Electronically Signed   By: Nelson Chimes M.D.   On: 11/10/2015 16:33   Dg Chest Port 1 View  Result Date: 11/22/2015 CLINICAL DATA:  Sickle-cell crisis, acute chest syndrome EXAM: PORTABLE CHEST 1 VIEW COMPARISON:  11/10/2015 FINDINGS: Marked cardiomegaly with mild vascular congestion and diffuse interstitial prominence. Basilar peripheral Kerley B-lines noted suspicious for mild basilar interstitial edema pattern. There is associated increased bibasilar atelectasis. No large effusion or pneumothorax. Trachea is midline. Left subclavian power port catheter tip at the SVC RA junction. IMPRESSION: Marked cardiomegaly with mild basilar interstitial edema pattern and increased basilar atelectasis. Electronically Signed   By: Jerilynn Mages.  Shick M.D.   On: 11/22/2015 08:40   Dg Chest Port 1 View  Result Date: 11/10/2015 CLINICAL DATA:  History of sickle cell disease, now with shortness of breath and hypoxia. EXAM: PORTABLE CHEST 1 VIEW COMPARISON:  10/28/2015; 09/26/2015; 08/07/2015 FINDINGS: Similar findings of enlarged cardiac silhouette and mediastinal contours given reduced lung volumes. Pulmonary vasculature is indistinct with cephalization of flow. Grossly unchanged bilateral infrahilar heterogeneous opacities favored to  represent atelectasis. Stable position of support apparatus. Trace pleural effusions are not excluded. No pneumothorax. 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 IMPRESSION: 1. Findings worrisome for pulmonary edema on this AP portable examination. Note, atypical infection could have a similar appearance. Further evaluation with a PA and lateral chest radiograph may be obtained as clinically indicated. 2. Similar findings of cardiomegaly. Electronically Signed   By: Sandi Mariscal M.D.   On: 11/10/2015 10:49    Scheduled Meds: . ambrisentan  5 mg Oral QHS  . aspirin  81 mg Oral Daily  . cholecalciferol  2,000 Units Oral Daily  . Deferasirox  1,080 mg Oral Daily  . famotidine  20 mg Oral Daily  . folic acid  1 mg Oral q morning - 10a  . gabapentin  300 mg Oral TID  . HYDROmorphone   Intravenous Q4H  . HYDROmorphone  4 mg Oral Q4H  . ketorolac  30 mg Intravenous Q6H  . lisinopril  5 mg Oral Daily  . morphine  30 mg Oral Q12H  . rivaroxaban  20 mg Oral Q breakfast  .  senna-docusate  1 tablet Oral BID  . SUMAtriptan  50 mg Oral Once  . torsemide  20 mg Oral Once   Continuous Infusions: . dextrose 5 % and 0.45% NaCl 75 mL/hr at 12/07/15 1213    Principal Problem:   Sickle cell pain crisis (HCC) Active Problems:   Cor pulmonale, chronic (HCC)   Chronic anticoagulation   Hb-SS disease with crisis ([place])   Hx of pulmonary embolus   Essential hypertension   PAH (pulmonary artery hypertension)   Chronic atrial fibrillation (HCC)   Sickle cell anemia with pain (HCC)   Ischemic stroke ([place])       In excess of 25 minutes spent during this visit. Greater than 50% involved face to face contact with the patient for assessment, counseling and coordination of care.

## 2015-12-08 DIAGNOSIS — D638 Anemia in other chronic diseases classified elsewhere: Secondary | ICD-10-CM

## 2015-12-08 DIAGNOSIS — R0989 Other specified symptoms and signs involving the circulatory and respiratory systems: Secondary | ICD-10-CM

## 2015-12-08 LAB — PREPARE RBC (CROSSMATCH)

## 2015-12-08 LAB — CBC WITH DIFFERENTIAL/PLATELET
BASOS ABS: 0.1 10*3/uL (ref 0.0–0.1)
BASOS PCT: 1 %
EOS PCT: 6 %
Eosinophils Absolute: 0.7 10*3/uL (ref 0.0–0.7)
HCT: 17.7 % — ABNORMAL LOW (ref 39.0–52.0)
Hemoglobin: 6.1 g/dL — CL (ref 13.0–17.0)
LYMPHS PCT: 20 %
Lymphs Abs: 2.1 10*3/uL (ref 0.7–4.0)
MCH: 30.3 pg (ref 26.0–34.0)
MCHC: 34.5 g/dL (ref 30.0–36.0)
MCV: 88.1 fL (ref 78.0–100.0)
MONO ABS: 1.4 10*3/uL — AB (ref 0.1–1.0)
MONOS PCT: 13 %
Neutro Abs: 6.4 10*3/uL (ref 1.7–7.7)
Neutrophils Relative %: 60 %
PLATELETS: 260 10*3/uL (ref 150–400)
RBC: 2.01 MIL/uL — ABNORMAL LOW (ref 4.22–5.81)
RDW: 19 % — AB (ref 11.5–15.5)
WBC: 10.6 10*3/uL — ABNORMAL HIGH (ref 4.0–10.5)

## 2015-12-08 LAB — RETICULOCYTES
RBC.: 2.01 MIL/uL — ABNORMAL LOW (ref 4.22–5.81)
RETIC COUNT ABSOLUTE: 84.4 10*3/uL (ref 19.0–186.0)
Retic Ct Pct: 4.2 % — ABNORMAL HIGH (ref 0.4–3.1)

## 2015-12-08 MED ORDER — SODIUM CHLORIDE 0.9 % IV SOLN
Freq: Once | INTRAVENOUS | Status: AC
  Administered 2015-12-08: 12:00:00 via INTRAVENOUS

## 2015-12-08 NOTE — Care Management Note (Signed)
Case Management Note  Patient Details  Name: [name] MRN: [id number] Date of Birth: Jun 21, 1980  Subjective/Objective:   35 yo admitted with SCC.                 Action/Plan: From home with significant other. Chart reviewed and CM following for DC needs.  Expected Discharge Date:   (unknown)               Expected Discharge Plan:  Home/Self Care  In-House Referral:     Discharge planning Services  CM Consult  Post Acute Care Choice:    Choice offered to:     DME Arranged:    DME Agency:     HH Arranged:    HH Agency:     Status of Service:  In process, will continue to follow  If discussed at Long Length of Stay Meetings, dates discussed:    Additional CommentsLynnell Catalan, RN 12/08/2015, 11:17 AM  [phone]

## 2015-12-08 NOTE — Care Management Important Message (Signed)
Important Message  Patient Details  Name: [name] MRN: [id number] Date of Birth: 1980/09/30   Medicare Important Message Given:  Yes    [name] 12/08/2015, 9:10 AMImportant Message  Patient Details  Name: [name] MRN: [id number] Date of Birth: 06-Mar-1980   Medicare Important Message Given:  Yes    [name] 12/08/2015, 9:10 AM

## 2015-12-08 NOTE — Progress Notes (Signed)
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 Hb SS with crisis: Pt continue to have pain at 7/10 which is the intensity of which he can manage with oral analgesics. I will schedule his Dilaudid and discontinue clinician assisted dose and continue Toradol.  2. Anemia of Chronic Disease: Will transfuse 1 unit of blood and hold Hydrea as reticulocytes have progressively decreased.  3. Hypoxia: Pt still requiring 2 L/min to maintain saturations above 90%.  4. Leukocytosis: Pt has no evidence of infection. Likely related to crisis.  5. S/P CVA: Pt seems to have some residual effects of CVA with regard to cognition. Will consult SLP for cognitive evaluation.  6. Fluid Retention: Pt has had a gain in weight thus he received Torsemide yesterday. Will give another dose today. 7. Chronic pain: Continue MS Contin.   Code Status: Full Code Family Communication: N/A Disposition Plan: Not yet ready for discharge  [place].  Pager [phone]. If 7PM-7AM, please contact night-coverage.  12/08/2015, 4:59 PM  LOS: 4 days   Interim History: Pt reports pain at 7/10 and localized to ribs. He has used 28.66 mg with 43/41:demands/deliveries in the last 24 hours. Since his stroke last month he seems to have problems with memory SLP consulted to evaluate cognition.  Consultants:  None  Procedures:  None  Antibiotics:  None  Objective: Vitals:   12/08/15 1215 12/08/15 1253 12/08/15 1501 12/08/15 1559  BP: 117/77 106/75 120/81   Pulse: 74  75 80   Resp: 19 16 16 16   Temp: 98.6 F (37 C) 98.2 F (36.8 C) 97.8 F (36.6 C)   TempSrc: Oral Oral Oral   SpO2: 95% 95% 94% 94%  Weight:      Height:       Weight change:   Intake/Output Summary (Last 24 hours) at 12/08/15 1659 Last data filed at 12/08/15 1253  Gross per 24 hour  Intake              760 ml  Output             2650 ml  Net            -1890 ml    General: Alert, awake, oriented x3, in no acute distress.  HEENT: [place]/AT PEERL, EOMI, mild icterus OROPHARYNX:  Moist, No exudate/ erythema/lesions.  Heart: Regular rate and rhythm.  Lungs: Clear to auscultation, no wheezing or rhonchi noted. No increased vocal fremitus resonant to percussion  Abdomen: Soft, nontender, nondistended, positive bowel sounds, no masses no hepatosplenomegaly noted.  Neuro: No focal neurological deficits noted cranial nerves II through XII grossly intact. Strength at baseline in bilateral upper and lower extremities. Musculoskeletal: No warmth swelling or erythema around joints, no spinal tenderness noted. Psychiatric: Patient alert and oriented x3, good insight and cognition, good recent to remote recall.    Data Reviewed: Basic Metabolic Panel:  Recent Labs Lab 12/04/15 0144  NA 138  K 3.9  CL 110  CO2 22  GLUCOSE 101*  BUN 13  CREATININE 0.81  CALCIUM 8.6*  Liver Function Tests:  Recent Labs Lab 12/04/15 0144  AST 27  ALT 19  ALKPHOS 70  BILITOT 5.9*  PROT 7.1  ALBUMIN 4.1   No results for input(s): LIPASE, AMYLASE in the last 168 hours. No results for input(s): AMMONIA in the last 168 hours. CBC:  Recent Labs Lab 12/04/15 0144 12/06/15 0817 12/08/15 0547  WBC 11.3* 12.0* 10.6*  NEUTROABS 7.5 7.8* 6.4  HGB 7.5* 6.3* 6.1*  HCT 22.6* 18.7* 17.7*  MCV 90.4 89.0 88.1  PLT 453* 314 260   Cardiac Enzymes: No results for input(s): CKTOTAL, CKMB, CKMBINDEX, TROPONINI in the last 168 hours. BNP (last 3 results)  Recent Labs  08/02/15 2307 11/28/15 0520   BNP 503.2* 422.5*    ProBNP (last 3 results) No results for input(s): PROBNP in the last 8760 hours.  CBG: No results for input(s): GLUCAP in the last 168 hours.  Recent Results (from the past 240 hour(s))  MRSA PCR Screening     Status: None   Collection Time: 12/04/15  1:59 PM  Result Value Ref Range Status   MRSA by PCR NEGATIVE NEGATIVE Final    Comment:        The GeneXpert MRSA Assay (FDA approved for NASAL specimens only), is one component of a comprehensive MRSA colonization surveillance program. It is not intended to diagnose MRSA infection nor to guide or monitor treatment for MRSA infections.      Studies: Dg Chest 2 View  Result Date: 11/27/2015 CLINICAL DATA:  Sickle cell disease.  Chest pain for 2-3 days. EXAM: CHEST  2 VIEW COMPARISON:  Chest radiograph 11/23/2015 FINDINGS: There is a left chest wall Port-A-Cath with the tip in the proximal right atrium, unchanged. Unchanged cardiomegaly. No pulmonary edema. No focal airspace consolidation. No pleural effusion or pneumothorax. Unchanged hazy opacities and nodular density of the right mid lung. IMPRESSION: Unchanged cardiomegaly without overt pulmonary edema. Unchanged hazy opacities in the right mid lung. Electronically Signed   By: Ulyses Jarred M.D.   On: 11/27/2015 23:05   Dg Chest 2 View  Result Date: 11/23/2015 CLINICAL DATA:  Bilateral side pain for 1 week with hypoxia. EXAM: CHEST  2 VIEW COMPARISON:  11/22/2015 FINDINGS: The lungs are clear wiithout focal pneumonia, edema, pneumothorax or pleural effusion. The cardio pericardial silhouette is enlarged. There is pulmonary vascular congestion without overt pulmonary edema. Nodular areas in the peripheral aspect of the right mid lung were seen on CT scan from 2 weeks ago. Left Port-A-Cath tip projects at the SVC/RA junction. Telemetry leads overlie the chest. IMPRESSION: Cardiomegaly with pulmonary vascular congestion. Nodular density right mid lung better  seen on recent CT scan. Electronically Signed   By: Misty Stanley M.D.   On: 11/23/2015 10:34   Ct Head Wo Contrast  Result Date: 11/10/2015 CLINICAL DATA:  Left-sided weakness. EXAM: CT HEAD WITHOUT CONTRAST TECHNIQUE: Contiguous axial images were obtained from the base of the skull through the vertex without intravenous contrast. COMPARISON:  None. FINDINGS: Brain: No evidence of acute infarction, hemorrhage, extra-axial collection, ventriculomegaly, or mass effect. Old left basal ganglia 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 Generalized cerebral atrophy. Periventricular white matter low attenuation likely secondary to microangiopathy. Vascular: No significant cerebral vascular atherosclerotic calcifications are noted. Skull: Negative for fracture or focal lesion. Sinuses/Orbits: Visualized portions of the orbits are unremarkable. Visualized portions of the paranasal sinuses and mastoid air cells are unremarkable. Other: None. IMPRESSION: 1. No acute intracranial pathology. Electronically Signed   By: Kathreen Devoid  On: 11/10/2015 11:16   Ct Angio Chest Pe W/cm &/or Wo Cm  Result Date: 11/10/2015 CLINICAL DATA:  Left flank pain and lethargy. Difficulty walking and facial droop with slurred speech. Hypoxia. Shortness of breath. Sickle cell anemia. EXAM: CT ANGIOGRAPHY CHEST WITH CONTRAST TECHNIQUE: Multidetector CT imaging of the chest was performed using the standard protocol during bolus administration of intravenous contrast. Multiplanar CT image reconstructions and MIPs were obtained to evaluate the vascular anatomy. CONTRAST:  65 cc Isovue 370 COMPARISON:  Multiple exams, including 11/10/2015 chest radiograph, and CT of 11/30/2014 FINDINGS: Cardiovascular: Filling defect compatible with acute pulmonary embolus in the right middle lobe, right lower lobe, and potentially left lower lobe signal told pulmonary vessels. Marked cardiomegaly with elongation of the heart, RV:LV ratio 1.5  (abnormally elevated). Small pericardial effusion. Enlarged main pulmonary artery at 4.5 cm transverse. Left Port-A-Cath noted, tip a in the right atrium. Mediastinum/Nodes: Mass or confluent nodularity in the anterior mediastinum including a 1.7 by 4.5 cm anterior mediastinal component on image 50/4, confluent lymph nodes were also present previously but somewhat less striking. Right hilar node 1.3 cm in short axis on image 57/4, previously 0.6 cm. A posterior right hilar node measures 1.4 cm in short axis on image 64/4, formerly 0.8 cm. Lungs/Pleura: Peripheral foraminal airspace opacity at the right lung apex is new possibly from pulmonary hemorrhage. There is bilateral primarily perihilar ground-glass opacities and patchy sub solid opacities in both lower lobes and to a lesser extent peripherally in the upper lobes. Likely a combination of edema and pulmonary hemorrhage along with scattered atelectasis. No significant pleural effusion. Upper Abdomen: Diffuse calcification of the spleen, similar to prior. 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 Endplate notching of the thoracic vertebra characteristic of sickle cell disease. Review of the MIP images confirms the above findings. IMPRESSION: 1. Acute pulmonary embolus is present in segmental branches of the right middle lobe and right lower lobe, and possibly the left lower lobe on today's exam. Positive for acute PE with CT evidence of right heart strain (RV/LV Ratio = 1.5) consistent with at least submassive (intermediate risk) PE. The presence of right heart strain has been associated with an increased risk of morbidity and mortality. Please activate Code PE by paging [phone]. 2. Stable marked cardiomegaly. 3. Small pericardial effusion. 4. Enlarged main pulmonary artery at 4.5 cm transverse. 5. Patchy ground-glass and peripheral opacities in both lungs likely a combination of mild pulmonary edema, pulmonary  hemorrhage, and scattered atelectasis. 6. Osseous findings of sickle cell disease. 7. There are enlarged mediastinal and right hilar lymph nodes, increased from prior. These may merit follow up in the nonacute setting to ensure resolution and exclude the possibility of entities such as lymphoproliferative disease. Critical Value/emergent results were called by telephone at the time of interpretation on 11/10/2015 at 1:06 pm to Dr. Threasa Beards BELFI , who verbally acknowledged these results. Electronically Signed   By: Van Clines M.D.   On: 11/10/2015 13:08   Mr Brain Wo Contrast  Result Date: 11/10/2015 CLINICAL DATA:  Sickle cell disease. Gait disturbance and left facial droop. EXAM: MRI HEAD WITHOUT CONTRAST TECHNIQUE: Multiplanar, multiecho pulse sequences of the brain and surrounding structures were obtained without intravenous contrast. COMPARISON:  Head CT same day. FINDINGS: Brain: There are scattered acute infarctions. Small foci of acute infarction are seen within the left superior cerebellum and left posterior pons/midbrain junction. There is acute infarction within the left occipital cortex measuring about 2 in diameter.  There is a small acute infarction within the left corpus callosum more cingulate gyrus. A few punctate acute infarctions are seen in the left frontoparietal cortex. No acute infarction affecting the right hemisphere. There are multiple old bilateral cerebellar infarctions. There are old small vessel infarctions affecting the thalami I and basal ganglia left more than right. No mass lesion, hemorrhage, hydrocephalus or extra-axial collection. Vascular: Major vessels at the base of the brain show flow. Skull and upper cervical spine: Negative except for chronic marrow changes of sickle cell. Sinuses/Orbits: Chronic marrow changes of sickle cell. No inflammatory sinus disease. Orbits negative. Other: None significant IMPRESSION: Background pattern of old infarctions throughout the  cerebellum and old lacunar infarctions affecting the thalami basal ganglia. Multiple acute infarctions including areas of involvement in the left cerebellum, left dorsal pons midbrain junction, left occipital lobe, the left corpus callosum or cingulate gyrus, and a few small punctate foci in the left frontoparietal cortex. Findings are most consistent with embolic disease from the heart or ascending aorta given the combination of anterior and posterior circulation infarctions. Electronically Signed   By: Nelson Chimes M.D.   On: 11/10/2015 16:33   Dg Chest Port 1 View  Result Date: 11/22/2015 CLINICAL DATA:  Sickle-cell crisis, acute chest syndrome EXAM: PORTABLE CHEST 1 VIEW COMPARISON:  11/10/2015 FINDINGS: Marked cardiomegaly with mild vascular congestion and diffuse interstitial prominence. Basilar peripheral Kerley B-lines noted suspicious for mild basilar interstitial edema pattern. There is associated increased bibasilar atelectasis. No large effusion or pneumothorax. Trachea is midline. Left subclavian power port catheter tip at the SVC RA junction. IMPRESSION: Marked cardiomegaly with mild basilar interstitial edema pattern and increased basilar atelectasis. Electronically Signed   By: Jerilynn Mages.  Shick M.D.   On: 11/22/2015 08:40   Dg Chest Port 1 View  Result Date: 11/10/2015 CLINICAL DATA:  History of sickle cell disease, now with shortness of breath and hypoxia. EXAM: PORTABLE CHEST 1 VIEW COMPARISON:  10/28/2015; 09/26/2015; 08/07/2015 FINDINGS: Similar findings of enlarged cardiac silhouette and mediastinal contours given reduced lung volumes. Pulmonary vasculature is indistinct with cephalization of flow. Grossly unchanged bilateral infrahilar heterogeneous opacities favored to represent atelectasis. Stable position of support apparatus. Trace pleural effusions are not excluded. No pneumothorax. 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 IMPRESSION:  1. Findings worrisome for pulmonary edema on this AP portable examination. Note, atypical infection could have a similar appearance. Further evaluation with a PA and lateral chest radiograph may be obtained as clinically indicated. 2. Similar findings of cardiomegaly. Electronically Signed   By: Sandi Mariscal M.D.   On: 11/10/2015 10:49    Scheduled Meds: . ambrisentan  5 mg Oral QHS  . aspirin  81 mg Oral Daily  . cholecalciferol  2,000 Units Oral Daily  . Deferasirox  1,080 mg Oral Daily  . famotidine  20 mg Oral Daily  . folic acid  1 mg Oral q morning - 10a  . gabapentin  300 mg Oral TID  . HYDROmorphone   Intravenous Q4H  . HYDROmorphone  4 mg Oral Q4H  . ketorolac  30 mg Intravenous Q6H  . lisinopril  5 mg Oral Daily  . morphine  30 mg Oral Q12H  . rivaroxaban  20 mg Oral Q breakfast  . senna-docusate  1 tablet Oral BID   Continuous Infusions: . dextrose 5 % and 0.45% NaCl 10 mL/hr at 12/07/15 1528    Principal Problem:   Sickle cell pain crisis [name]) Active Problems:  Cor pulmonale, chronic (HCC)   Chronic anticoagulation   Hb-SS disease with crisis ([place])   Hx of pulmonary embolus   Essential hypertension   PAH (pulmonary artery hypertension)   Chronic atrial fibrillation (HCC)   Sickle cell anemia with pain (HCC)   Ischemic stroke ([place])       In excess of 25 minutes spent during this visit. Greater than 50% involved face to face contact with the patient for assessment, counseling and coordination of care.

## 2015-12-09 LAB — CBC WITH DIFFERENTIAL/PLATELET
BASOS ABS: 0.1 10*3/uL (ref 0.0–0.1)
Basophils Relative: 1 %
EOS PCT: 5 %
Eosinophils Absolute: 0.6 10*3/uL (ref 0.0–0.7)
HEMATOCRIT: 20.2 % — AB (ref 39.0–52.0)
HEMOGLOBIN: 7 g/dL — AB (ref 13.0–17.0)
LYMPHS ABS: 1.5 10*3/uL (ref 0.7–4.0)
Lymphocytes Relative: 14 %
MCH: 29.8 pg (ref 26.0–34.0)
MCHC: 34.7 g/dL (ref 30.0–36.0)
MCV: 86 fL (ref 78.0–100.0)
MONO ABS: 1.3 10*3/uL — AB (ref 0.1–1.0)
MONOS PCT: 12 %
NEUTROS ABS: 7.5 10*3/uL (ref 1.7–7.7)
Neutrophils Relative %: 68 %
Platelets: 251 10*3/uL (ref 150–400)
RBC: 2.35 MIL/uL — ABNORMAL LOW (ref 4.22–5.81)
RDW: 18.6 % — AB (ref 11.5–15.5)
WBC: 11 10*3/uL — AB (ref 4.0–10.5)

## 2015-12-09 LAB — TYPE AND SCREEN
ABO/RH(D): O POS
ANTIBODY SCREEN: NEGATIVE
UNIT DIVISION: 0

## 2015-12-09 MED ORDER — TORSEMIDE 20 MG PO TABS
20.0000 mg | ORAL_TABLET | Freq: Once | ORAL | Status: AC
  Administered 2015-12-09: 20 mg via ORAL
  Filled 2015-12-09: qty 1

## 2015-12-09 MED ORDER — HEPARIN SOD (PORK) LOCK FLUSH 100 UNIT/ML IV SOLN
500.0000 [IU] | INTRAVENOUS | Status: DC | PRN
  Administered 2015-12-09: 500 [IU]
  Filled 2015-12-09: qty 5

## 2015-12-09 MED ORDER — HEPARIN SOD (PORK) LOCK FLUSH 100 UNIT/ML IV SOLN
500.0000 [IU] | INTRAVENOUS | Status: DC
Start: 2015-12-09 — End: 2015-12-09

## 2015-12-09 NOTE — Evaluation (Signed)
Speech Language Pathology Evaluation Patient Details Name: [name] MRN: [id number] DOB: 1980-03-31 Today's Date: 12/09/2015 Time: [id number] SLP Time Calculation (min) (ACUTE ONLY): 43 min  Problem List:  Patient Active Problem List   Diagnosis Date Noted  . Slurred speech 11/10/2015  . Pulmonary emboli ([place]) 11/10/2015  . Aphasia   . Acute chest syndrome ([place])   . Ischemic stroke ([place])   . Sickle cell crisis ([place]) 10/27/2015  . Sickle cell pain crisis ([place]) 09/18/2015  . 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 Shortness of breath   . Pulmonary embolism without acute cor pulmonale ([place])   . Fever   . 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 Acute kidney injury ([place])   . Hypoxia   . Anemia 01/31/2015  . Symptomatic anemia   . Hb-SS disease without crisis ([place]) 10/07/2014  . Acute on chronic respiratory failure with hypoxia ([place])   . PVC's (premature ventricular contractions) 09/10/2014  . Abnormal EKG 09/10/2014  . Chronic atrial fibrillation ([place]) 09/09/2014  . Cor pulmonale, chronic ([place]) 08/25/2014  . Sickle cell anemia ([place]) 06/25/2014  . Anemia of chronic disease 06/25/2014  . PAH (pulmonary artery hypertension) 03/18/2014  . Hb-SS disease with crisis ([place]) 01/22/2014  . Pulmonary embolus ([place]) 01/01/2014  . Paralytic strabismus, external ophthalmoplegia   . Chronic pain syndrome 12/12/2013  . Elevated troponin 11/26/2013  . Chronic anticoagulation 08/22/2013  . Essential hypertension 08/22/2013  . Pulmonary HTN 06/18/2013  . Functional asplenia   . Vitamin D deficiency 02/13/2013  . Hx of pulmonary embolus 06/29/2012  . Hemochromatosis 12/14/2011  . Avascular necrosis [facility])    Past Medical History:  Past Medical History:  Diagnosis Date  . Acute chest syndrome ([place]) 06/18/2013  . Acute embolism and thrombosis of right internal jugular vein ([place])   . Alcohol consumption of one to four drinks per day   . Avascular necrosis (HCC)     Right Hip  . Blood transfusion   . Chronic anticoagulation   . Demand ischemia ([place]) 01/02/2014  . Former smoker   . Functional asplenia   . Hb-SS disease with crisis ([place])   . History of Clostridium difficile infection   . History of pulmonary embolus (PE)   . Hypertension   . Hypokalemia   . Leukocytosis    Chronic  . Mood disorder ([place])   . Noncompliance with medication regimen   . Oxygen deficiency   . Pulmonary hypertension   . Second hand tobacco smoke exposure   . Sickle cell anemia (HCC)   . Sickle-cell crisis with associated acute chest syndrome ([place]) 05/13/2013  . Stroke ([place])   . Thrombocytosis (HCC)    Chronic  . Uses marijuana    Past Surgical History:  Past Surgical History:  Procedure Laterality Date  . CHOLECYSTECTOMY     01/2008  . Excision of left periauricular cyst     10/2009  . Excision of right ear lobe cyst with primary closur     11/2007  . Porta cath placement    . Porta cath removal    . PORTACATH PLACEMENT  01/05/2012   Procedure: INSERTION PORT-A-CATH;  Surgeon: Odis Hollingshead, MD;  Location: [place] [place];  Service: General;  Laterality: N/A;  ultrasound guiced port a cath insertion with fluoroscopy  . Right hip replacement     08/2006  . UMBILICAL HERNIA REPAIR     01/2008   HPI:  35 yo male admitted to [facility] with sickle  cell crisis.  PMH + for multiple CVAs - MRI 11/10/2015 - old CVAs, thalamic CVA, acute left dorsal pons, left cerebellum, left occipital and left frontoparietal CVA.  Pt denies changes except "slurred speech" since his CVA.  Order for cognitive evaluation received as MD reports concern for cognitive deficits since CVA.     Assessment / Plan / Recommendation Clinical Impression  MOCA administered to pt with score of 25 of 30 indicating mild deficit - lowest normal is 26.  Pt presented with areas of dificulties in mental math - even when presented with paper/pencil and word fluency (named 6 words in one minute with normal being 11).   Pt's strengths included trail task, executive functioning, naming, memory, abstraction, orientation and delayed recall.  Pt is mildy dysarthric but is amenable to slowing rate and overarticulating to maximize intelligilbility.  SlP reviewed MOCA findings with pt and provided copy of his test.    Pt states he does not desire follow up SLP at home, stating his mom manages his medications and home duties.  [name] admits he does miss some appointments.  SLP discussed compensation strategies for cognitive linguistic abilities- to maximize his independence including managing home medications, appointments, etc.   Pt states he wants his mother to manage his medications.    [name] informed SLP that he enjoys watching movies in his free time and states his mother does now work outside of the home.  SLP to sign off, thanks for this consult.  Please consult [place] SlP for maximal functional eval/treatment in home environment if pt desires.  Thanks.     SLP Assessment  Patient does not need any further Speech Lanaguage Pathology Services    Follow Up Recommendations  None    Frequency and Duration     n/a      SLP Evaluation Cognition  Overall Cognitive Status: No family/caregiver present to determine baseline cognitive functioning Arousal/Alertness: Awake/alert Orientation Level: Oriented X4 Attention: Sustained Sustained Attention: Appears intact Memory: Impaired Memory Impairment: Retrieval deficit (pt recalled 4/5 words independently within 59minutes, 1 word required category cue) Awareness: Appears intact (aware to speech deficits and attempts to compensate by slowing rate of speech) Problem Solving: Impaired Problem Solving Impairment: Verbal complex (pt with difficulty managing math task of subtraction even with paper/pencil)       Comprehension  Auditory Comprehension Overall Auditory Comprehension: Appears within functional limits for tasks assessed Yes/No Questions: Not  tested Commands: Within Functional Limits Conversation: Complex Reading Comprehension Reading Status: Within funtional limits (for limited reading pt conducted)    Expression Expression Primary Mode of Expression: Verbal Verbal Expression Overall Verbal Expression: Impaired Initiation: No impairment Level of Generative/Spontaneous Verbalization: Sentence (pt answers questions but does not expand on communication) Repetition: No impairment Naming: No impairment Pragmatics:  (? mild flat affect ? ? baseline or personality ?  ) Written Expression Dominant Hand: Right Written Expression:  (pt able to conduct clock drawing, trail task without deficits)   Oral / Motor  Oral Motor/Sensory Function Overall Oral Motor/Sensory Function: Within functional limits Motor Speech Overall Motor Speech: Appears within functional limits for tasks assessed Respiration: Within functional limits Resonance: Within functional limits Articulation: Impaired Intelligibility: Intelligibility reduced Word: 75-100% accurate Phrase: 50-74% accurate Sentence: Not tested Conversation: Not tested Motor Planning: Witnin functional limits Motor Speech Errors: Not applicable Effective Techniques: Slow rate;Over-articulate   GO                    Macario Golds  Luanna Salk, [place] [facility] SLP [phone]

## 2015-12-09 NOTE — Progress Notes (Addendum)
SATURATION QUALIFICATIONS: (This note is used to comply with regulatory documentation for home oxygen)  Patient Saturations on Room Air at Rest = 87%  Patient Saturations on Room Air while Ambulating = 84%  Patient Saturations on 2 Liters of oxygen while Ambulating = 96%  Please briefly explain why patient needs home oxygen: Pt desaturates with any activity on room air

## 2015-12-09 NOTE — Discharge Summary (Signed)
[name] MRN: [id number] DOB/AGE: Nov 23, 1980 35 y.o.  Admit date: 12/04/2015 Discharge date: 12/09/2015  Primary Care Physician:  [name], MD   Discharge Diagnoses:   Patient Active Problem List   Diagnosis Date Noted  . Cor pulmonale, chronic ([place]) 08/25/2014    Priority: High  . Pulmonary HTN 06/18/2013    Priority: High  . Functional asplenia     Priority: High  . Anemia of chronic disease 06/25/2014    Priority: Medium  . Hb-SS disease with crisis ([place]) 01/22/2014    Priority: Medium  . Chronic anticoagulation 08/22/2013    Priority: Medium  . Avascular necrosis ([place])     Priority: Low  . Slurred speech 11/10/2015  . Pulmonary emboli ([place]) 11/10/2015  . Aphasia   . Acute chest syndrome ([place])   . Ischemic stroke ([place])   . Sickle cell crisis ([place]) 10/27/2015  . Sickle cell pain crisis ([place]) 09/18/2015  . Sickle cell anemia with crisis ([place]) 09/04/2015  . Shortness of breath   . Pulmonary embolism without acute cor pulmonale ([place])   . Fever   . Sickle cell anemia with pain ([place]) 05/07/2015  . Acute kidney injury ([place])   . Hypoxia   . Anemia 01/31/2015  . Symptomatic anemia   . Hb-SS disease without crisis ([place]) 10/07/2014  . Acute on chronic respiratory failure with hypoxia ([place])   . PVC's (premature ventricular contractions) 09/10/2014  . Abnormal EKG 09/10/2014  . Chronic atrial fibrillation ([place]) 09/09/2014  . Sickle cell anemia ([place]) 06/25/2014  . PAH (pulmonary artery hypertension) 03/18/2014  . Pulmonary embolus ([place]) 01/01/2014  . Paralytic strabismus, external ophthalmoplegia   . Chronic pain syndrome 12/12/2013  . Elevated troponin 11/26/2013  . Essential hypertension 08/22/2013  . Vitamin D deficiency 02/13/2013  . Hx of pulmonary embolus 06/29/2012  . Hemochromatosis 12/14/2011    DISCHARGE MEDICATION:   Medication List    TAKE these medications   ambrisentan 5 MG tablet Commonly known as:  LETAIRIS Take 5 mg by mouth at  bedtime.   aspirin 81 MG chewable tablet Chew 1 tablet (81 mg total) by mouth daily.   folic acid 1 MG tablet Commonly known as:  FOLVITE Take 1 tablet (1 mg total) by mouth every morning.   gabapentin 300 MG capsule Commonly known as:  NEURONTIN Take 1 capsule (300 mg total) by mouth 3 (three) times daily.   HYDROmorphone 4 MG tablet Commonly known as:  DILAUDID Take 1 tablet (4 mg total) by mouth every 4 (four) hours as needed for severe pain.   hydroxyurea 500 MG capsule Commonly known as:  HYDREA Take 1,000 mg by mouth daily. May take with food to minimize GI side effects.   JADENU 360 MG Tabs Generic drug:  Deferasirox Take 1,080 mg by mouth daily.   lisinopril 5 MG tablet Commonly known as:  PRINIVIL,ZESTRIL Take 1 tablet (5 mg total) by mouth daily.   morphine 30 MG 12 hr tablet Commonly known as:  MS CONTIN Take 1 tablet (30 mg total) by mouth every 12 (twelve) hours.   rivaroxaban 20 MG Tabs tablet Commonly known as:  XARELTO Take 20 mg by mouth daily.   torsemide 20 MG tablet Commonly known as:  DEMADEX Take 20 mg by mouth daily as needed. For increase in patient weight greater than 3 lbs.   Vitamin D 2000 units tablet Take 1 tablet (2,000 Units total) by mouth daily.   zolpidem 10 MG tablet Commonly known as:  AMBIEN  Take 1 tablet (10 mg total) by mouth at bedtime as needed for sleep.         Consults:    SIGNIFICANT DIAGNOSTIC STUDIES:  Dg Chest 2 View  Result Date: 11/27/2015 CLINICAL DATA:  Sickle cell disease.  Chest pain for 2-3 days. EXAM: CHEST  2 VIEW COMPARISON:  Chest radiograph 11/23/2015 FINDINGS: There is a left chest wall Port-A-Cath with the tip in the proximal right atrium, unchanged. Unchanged cardiomegaly. No pulmonary edema. No focal airspace consolidation. No pleural effusion or pneumothorax. Unchanged hazy opacities and nodular density of the right mid lung. IMPRESSION: Unchanged cardiomegaly without overt pulmonary edema.  Unchanged hazy opacities in the right mid lung. Electronically Signed   By: Ulyses Jarred M.D.   On: 11/27/2015 23:05   Dg Chest 2 View  Result Date: 11/23/2015 CLINICAL DATA:  Bilateral side pain for 1 week with hypoxia. EXAM: CHEST  2 VIEW COMPARISON:  11/22/2015 FINDINGS: The lungs are clear wiithout focal pneumonia, edema, pneumothorax or pleural effusion. The cardio pericardial silhouette is enlarged. There is pulmonary vascular congestion without overt pulmonary edema. Nodular areas in the peripheral aspect of the right mid lung were seen on CT scan from 2 weeks ago. Left Port-A-Cath tip projects at the SVC/RA junction. Telemetry leads overlie the chest. IMPRESSION: Cardiomegaly with pulmonary vascular congestion. Nodular density right mid lung better seen on recent CT scan. Electronically Signed   By: Misty Stanley M.D.   On: 11/23/2015 10:34   Ct Head Wo Contrast  Result Date: 11/10/2015 CLINICAL DATA:  Left-sided weakness. EXAM: CT HEAD WITHOUT CONTRAST TECHNIQUE: Contiguous axial images were obtained from the base of the skull through the vertex without intravenous contrast. COMPARISON:  None. FINDINGS: Brain: No evidence of acute infarction, hemorrhage, extra-axial collection, ventriculomegaly, or mass effect. Old left basal ganglia 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 Generalized cerebral atrophy. Periventricular white matter low attenuation likely secondary to microangiopathy. Vascular: No significant cerebral vascular atherosclerotic calcifications are noted. Skull: Negative for fracture or focal lesion. Sinuses/Orbits: Visualized portions of the orbits are unremarkable. Visualized portions of the paranasal sinuses and mastoid air cells are unremarkable. Other: None. IMPRESSION: 1. No acute intracranial pathology. Electronically Signed   By: Kathreen Devoid   On: 11/10/2015 11:16   Ct Angio Chest Pe W/cm &/or Wo Cm  Result Date: 11/10/2015 CLINICAL DATA:  Left flank pain and  lethargy. Difficulty walking and facial droop with slurred speech. Hypoxia. Shortness of breath. Sickle cell anemia. EXAM: CT ANGIOGRAPHY CHEST WITH CONTRAST TECHNIQUE: Multidetector CT imaging of the chest was performed using the standard protocol during bolus administration of intravenous contrast. Multiplanar CT image reconstructions and MIPs were obtained to evaluate the vascular anatomy. CONTRAST:  65 cc Isovue 370 COMPARISON:  Multiple exams, including 11/10/2015 chest radiograph, and CT of 11/30/2014 FINDINGS: Cardiovascular: Filling defect compatible with acute pulmonary embolus in the right middle lobe, right lower lobe, and potentially left lower lobe signal told pulmonary vessels. Marked cardiomegaly with elongation of the heart, RV:LV ratio 1.5 (abnormally elevated). Small pericardial effusion. Enlarged main pulmonary artery at 4.5 cm transverse. Left Port-A-Cath noted, tip a in the right atrium. Mediastinum/Nodes: Mass or confluent nodularity in the anterior mediastinum including a 1.7 by 4.5 cm anterior mediastinal component on image 50/4, confluent lymph nodes were also present previously but somewhat less striking. Right hilar node 1.3 cm in short axis on image 57/4, previously 0.6 cm. A posterior right hilar node measures 1.4 cm in short axis on image 64/4, formerly  0.8 cm. Lungs/Pleura: Peripheral foraminal airspace opacity at the right lung apex is new possibly from pulmonary hemorrhage. There is bilateral primarily perihilar ground-glass opacities and patchy sub solid opacities in both lower lobes and to a lesser extent peripherally in the upper lobes. Likely a combination of edema and pulmonary hemorrhage along with scattered atelectasis. No significant pleural effusion. Upper Abdomen: Diffuse calcification of the spleen, similar to prior. 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 Endplate notching of the thoracic vertebra characteristic of sickle  cell disease. Review of the MIP images confirms the above findings. IMPRESSION: 1. Acute pulmonary embolus is present in segmental branches of the right middle lobe and right lower lobe, and possibly the left lower lobe on today's exam. Positive for acute PE with CT evidence of right heart strain (RV/LV Ratio = 1.5) consistent with at least submassive (intermediate risk) PE. The presence of right heart strain has been associated with an increased risk of morbidity and mortality. Please activate Code PE by paging [phone]. 2. Stable marked cardiomegaly. 3. Small pericardial effusion. 4. Enlarged main pulmonary artery at 4.5 cm transverse. 5. Patchy ground-glass and peripheral opacities in both lungs likely a combination of mild pulmonary edema, pulmonary hemorrhage, and scattered atelectasis. 6. Osseous findings of sickle cell disease. 7. There are enlarged mediastinal and right hilar lymph nodes, increased from prior. These may merit follow up in the nonacute setting to ensure resolution and exclude the possibility of entities such as lymphoproliferative disease. Critical Value/emergent results were called by telephone at the time of interpretation on 11/10/2015 at 1:06 pm to Dr. Threasa Beards BELFI , who verbally acknowledged these results. Electronically Signed   By: Van Clines M.D.   On: 11/10/2015 13:08   Mr Brain Wo Contrast  Result Date: 11/10/2015 CLINICAL DATA:  Sickle cell disease. Gait disturbance and left facial droop. EXAM: MRI HEAD WITHOUT CONTRAST TECHNIQUE: Multiplanar, multiecho pulse sequences of the brain and surrounding structures were obtained without intravenous contrast. COMPARISON:  Head CT same day. FINDINGS: Brain: There are scattered acute infarctions. Small foci of acute infarction are seen within the left superior cerebellum and left posterior pons/midbrain junction. There is acute infarction within the left occipital cortex measuring about 2 in diameter. There is a small acute  infarction within the left corpus callosum more cingulate gyrus. A few punctate acute infarctions are seen in the left frontoparietal cortex. No acute infarction affecting the right hemisphere. There are multiple old bilateral cerebellar infarctions. There are old small vessel infarctions affecting the thalami I and basal ganglia left more than right. No mass lesion, hemorrhage, hydrocephalus or extra-axial collection. Vascular: Major vessels at the base of the brain show flow. Skull and upper cervical spine: Negative except for chronic marrow changes of sickle cell. Sinuses/Orbits: Chronic marrow changes of sickle cell. No inflammatory sinus disease. Orbits negative. Other: None significant IMPRESSION: Background pattern of old infarctions throughout the cerebellum and old lacunar infarctions affecting the thalami basal ganglia. Multiple acute infarctions including areas of involvement in the left cerebellum, left dorsal pons midbrain junction, left occipital lobe, the left corpus callosum or cingulate gyrus, and a few small punctate foci in the left frontoparietal cortex. Findings are most consistent with embolic disease from the heart or ascending aorta given the combination of anterior and posterior circulation infarctions. Electronically Signed   By: Nelson Chimes M.D.   On: 11/10/2015 16:33   Dg Chest Port 1 View  Result Date: 11/22/2015 CLINICAL DATA:  Sickle-cell crisis, acute chest  syndrome EXAM: PORTABLE CHEST 1 VIEW COMPARISON:  11/10/2015 FINDINGS: Marked cardiomegaly with mild vascular congestion and diffuse interstitial prominence. Basilar peripheral Kerley B-lines noted suspicious for mild basilar interstitial edema pattern. There is associated increased bibasilar atelectasis. No large effusion or pneumothorax. Trachea is midline. Left subclavian power port catheter tip at the SVC RA junction. IMPRESSION: Marked cardiomegaly with mild basilar interstitial edema pattern and increased basilar  atelectasis. Electronically Signed   By: Jerilynn Mages.  Shick M.D.   On: 11/22/2015 08:40   Dg Chest Port 1 View  Result Date: 11/10/2015 CLINICAL DATA:  History of sickle cell disease, now with shortness of breath and hypoxia. EXAM: PORTABLE CHEST 1 VIEW COMPARISON:  10/28/2015; 09/26/2015; 08/07/2015 FINDINGS: Similar findings of enlarged cardiac silhouette and mediastinal contours given reduced lung volumes. Pulmonary vasculature is indistinct with cephalization of flow. Grossly unchanged bilateral infrahilar heterogeneous opacities favored to represent atelectasis. Stable position of support apparatus. Trace pleural effusions are not excluded. No pneumothorax. 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 IMPRESSION: 1. Findings worrisome for pulmonary edema on this AP portable examination. Note, atypical infection could have a similar appearance. Further evaluation with a PA and lateral chest radiograph may be obtained as clinically indicated. 2. Similar findings of cardiomegaly. Electronically Signed   By: Sandi Mariscal M.D.   On: 11/10/2015 10:49      Recent Results (from the past 240 hour(s))  MRSA PCR Screening     Status: None   Collection Time: 12/04/15  1:59 PM  Result Value Ref Range Status   MRSA by PCR NEGATIVE NEGATIVE Final    Comment:        The GeneXpert MRSA Assay (FDA approved for NASAL specimens only), is one component of a comprehensive MRSA colonization surveillance program. It is not intended to diagnose MRSA infection nor to guide or monitor treatment for MRSA infections.     BRIEF ADMITTING H & P: NIC LERNER is a 35 y.o. male with medical history significant of sickle cell disease, stroke with aphasia, acute chest syndrome, DVT, Afib, PE on Xarelto, pulmonary hypertension, cor pulmonale, chronic respiratory failure, C diff colitis, who presents with bilateral rib cages and back pain.   Patient states that he has  worsening pain since yesterday, which has been progressively getting worse. His pain is mainly located in the bilateral sidesof hisrib cages and back. It is constant, 8 out of 10 in severity, nonradiating. It is not aggravated or alleviated by any known factors. States that his home pain medication does not help anymore. He does not have chest pain or shortness of breath. No cough, no fever or chills. Patient denies nausea, vomiting, diarrhea, abdominal pain, symptoms of UTI or new unilateral weakness. He has chronic slurred speech and aphasia due to previous stroke. He has chronic slurred speech and aphasia from previous stroke.   ED Course: pt was found to have WBC 11.3, hemoglobin 7.5 which was 7.6 on 11/27/15, electrolytes renal function okay, temperature normal, no tachycardia, O2 saturation 96% on room air. Patient is placed on telemetry bed for observation.   Hospital Course:  Present on Admission: . Sickle cell anemia with pain ([place]) . Chronic atrial fibrillation ([place]) . Cor pulmonale, chronic ([place]) . Essential hypertension . Hx of pulmonary embolus . Ischemic stroke ([place]) . PAH (pulmonary artery hypertension) . Sickle cell pain crisis ([place]) . Hb-SS disease with crisis ([place])  Pt was admitted with Hb SS with crisis. He was treated with Dilaudid via  PCA, Toradol and IVF for management of pain. As the pain improved he was transitioned to oral analgesics and discharged home on pain medications as prescribed pre-hospital. Pt also had a decrease in his Hb below that expected for his crisis. An evaluation of his reticulocytes showed that they were inadequate for his degree of anemia so Hydrea was held during hospitalization. He was also transfused 1 unit of RBC at at the time of discharge, his Hb was 7.5 g/dL. Hydrea was resumed at discharge and he should have his Hb checked in 1 week. Otherwise he was continued on his chronic medications without interruption. He also had Hypoxia which was  unchanged from pre-hospital. He admits that he has not been using his Oxygen as recommended ATC but only at night. This hypoxia is likely triggering crisis.     Disposition and Follow-up: Pt discharged home in good condition and is to follow up with his Hematologist and PMD as scheduled.   Discharge Instructions    Activity as tolerated - No restrictions    Complete by:  As directed    Diet - low sodium heart healthy    Complete by:  As directed       DISCHARGE EXAM:  General: Alert, awake, oriented x3, in mild distress.  HEENT: [place]/AT PEERL, EOMI, anicteric Neck: Trachea midline, no masses, no thyromegal,y no JVD, no carotid bruit OROPHARYNX: Moist, No exudate/ erythema/lesions.  Heart: Regular rate and rhythm, without murmurs, rubs, gallops or S3. PMI non-displaced. Exam reveals no decreased pulses. Pulmonary/Chest: Normal effort. Breath sounds normal. No. Apnea. Clear to auscultation,no stridor,  no wheezing and no rhonchi noted. No respiratory distress and no tenderness noted. Abdomen: Soft, nontender, nondistended, normal bowel sounds, no masses no hepatosplenomegaly noted. No fluid wave and no ascites. There is no guarding or rebound. Neuro: Alert and oriented to person, place and time. Normal motor skills, Displays no atrophy or tremors and exhibits normal muscle tone.  No focal neurological deficits noted cranial nerves II through XII grossly intact. No sensory deficit noted.  Strength at baseline in bilateral upper and lower extremities. Gait normal. Musculoskeletal: No warm swelling or erythema around joints, no spinal tenderness noted. Psychiatric: Patient alert and oriented x3, there appears to be some deficit in recall since having the last CVA.  Mood normal.   Blood pressure 111/70, pulse 73, temperature 98.8 F (37.1 C), temperature source Oral, resp. rate 15, height 6' (1.829 m), weight 83.7 kg (184 lb 9.6 oz), SpO2 95 %.  No results for input(s): NA, K, CL, CO2,  GLUCOSE, BUN, CREATININE, CALCIUM, MG, PHOS in the last 72 hours. No results for input(s): AST, ALT, ALKPHOS, BILITOT, PROT, ALBUMIN in the last 72 hours. No results for input(s): LIPASE, AMYLASE in the last 72 hours.  Recent Labs  12/08/15 0547 12/09/15 1141  WBC 10.6* 11.0*  NEUTROABS 6.4 7.5  HGB 6.1* 7.0*  HCT 17.7* 20.2*  MCV 88.1 86.0  PLT 260 251     Total time spent including face to face and decision making was greater than 30 minutes  Signed: Katelyn Broadnax A. 12/09/2015, 3:44 PM

## 2015-12-09 NOTE — Progress Notes (Signed)
Pt discharged home in stable condition. Discharge instructions given. Home meds returned from pharmacy. Pt verbalized understanding.  No immediate questions or concerns.  Pt declined wheelchair and oxygen for discharge.

## 2015-12-09 NOTE — Progress Notes (Signed)
Per pt, he was on home 02 prior to admission and has a travel tank to get him home in the room. [facility] [employer] rep informed of 02 order to continue. Marney Doctor RN,BSN,NCM [phone]

## 2015-12-12 ENCOUNTER — Encounter (HOSPITAL_COMMUNITY): Payer: Self-pay | Admitting: Emergency Medicine

## 2015-12-12 ENCOUNTER — Emergency Department (HOSPITAL_COMMUNITY)
Admission: EM | Admit: 2015-12-12 | Discharge: 2015-12-12 | Disposition: A | Discharge status: Home / Self Care | Payer: Medicare Other | Source: Home / Self Care | Attending: Emergency Medicine | Admitting: Emergency Medicine

## 2015-12-12 DIAGNOSIS — Z9981 Dependence on supplemental oxygen: Secondary | ICD-10-CM

## 2015-12-12 DIAGNOSIS — F1120 Opioid dependence, uncomplicated: Secondary | ICD-10-CM | POA: Diagnosis not present

## 2015-12-12 DIAGNOSIS — K432 Incisional hernia without obstruction or gangrene: Secondary | ICD-10-CM | POA: Diagnosis present

## 2015-12-12 DIAGNOSIS — J9611 Chronic respiratory failure with hypoxia: Secondary | ICD-10-CM | POA: Diagnosis not present

## 2015-12-12 DIAGNOSIS — Z79899 Other long term (current) drug therapy: Secondary | ICD-10-CM

## 2015-12-12 DIAGNOSIS — D5700 Hb-SS disease with crisis, unspecified: Secondary | ICD-10-CM | POA: Diagnosis present

## 2015-12-12 DIAGNOSIS — K55039 Acute (reversible) ischemia of large intestine, extent unspecified: Secondary | ICD-10-CM | POA: Diagnosis not present

## 2015-12-12 DIAGNOSIS — Z9119 Patient's noncompliance with other medical treatment and regimen: Secondary | ICD-10-CM | POA: Diagnosis not present

## 2015-12-12 DIAGNOSIS — Z96641 Presence of right artificial hip joint: Secondary | ICD-10-CM | POA: Diagnosis present

## 2015-12-12 DIAGNOSIS — Z87891 Personal history of nicotine dependence: Secondary | ICD-10-CM

## 2015-12-12 DIAGNOSIS — I10 Essential (primary) hypertension: Secondary | ICD-10-CM | POA: Insufficient documentation

## 2015-12-12 DIAGNOSIS — Z8673 Personal history of transient ischemic attack (TIA), and cerebral infarction without residual deficits: Secondary | ICD-10-CM

## 2015-12-12 DIAGNOSIS — Z7901 Long term (current) use of anticoagulants: Secondary | ICD-10-CM

## 2015-12-12 DIAGNOSIS — Z86711 Personal history of pulmonary embolism: Secondary | ICD-10-CM

## 2015-12-12 DIAGNOSIS — I2729 Other secondary pulmonary hypertension: Secondary | ICD-10-CM | POA: Diagnosis not present

## 2015-12-12 DIAGNOSIS — Z86718 Personal history of other venous thrombosis and embolism: Secondary | ICD-10-CM | POA: Diagnosis not present

## 2015-12-12 DIAGNOSIS — Z7982 Long term (current) use of aspirin: Secondary | ICD-10-CM

## 2015-12-12 DIAGNOSIS — R109 Unspecified abdominal pain: Secondary | ICD-10-CM | POA: Diagnosis present

## 2015-12-12 LAB — CBC WITH DIFFERENTIAL/PLATELET
BASOS ABS: 0.1 10*3/uL (ref 0.0–0.1)
BASOS PCT: 1 %
EOS ABS: 0.2 10*3/uL (ref 0.0–0.7)
Eosinophils Relative: 2 %
HEMATOCRIT: 23.9 % — AB (ref 39.0–52.0)
HEMOGLOBIN: 8 g/dL — AB (ref 13.0–17.0)
LYMPHS ABS: 1.6 10*3/uL (ref 0.7–4.0)
Lymphocytes Relative: 14 %
MCH: 30 pg (ref 26.0–34.0)
MCHC: 33.5 g/dL (ref 30.0–36.0)
MCV: 89.5 fL (ref 78.0–100.0)
Monocytes Absolute: 1.7 10*3/uL — ABNORMAL HIGH (ref 0.1–1.0)
Monocytes Relative: 15 %
NEUTROS ABS: 7.9 10*3/uL — AB (ref 1.7–7.7)
Neutrophils Relative %: 68 %
Platelets: 373 10*3/uL (ref 150–400)
RBC: 2.67 MIL/uL — ABNORMAL LOW (ref 4.22–5.81)
RDW: 18.9 % — AB (ref 11.5–15.5)
WBC: 11.5 10*3/uL — ABNORMAL HIGH (ref 4.0–10.5)

## 2015-12-12 LAB — COMPREHENSIVE METABOLIC PANEL
ALBUMIN: 4.5 g/dL (ref 3.5–5.0)
ALK PHOS: 80 U/L (ref 38–126)
ALT: 20 U/L (ref 17–63)
AST: 28 U/L (ref 15–41)
Anion gap: 6 (ref 5–15)
BILIRUBIN TOTAL: 3.5 mg/dL — AB (ref 0.3–1.2)
BUN: 8 mg/dL (ref 6–20)
CALCIUM: 9.2 mg/dL (ref 8.9–10.3)
CO2: 24 mmol/L (ref 22–32)
Chloride: 106 mmol/L (ref 101–111)
Creatinine, Ser: 0.66 mg/dL (ref 0.61–1.24)
GFR calc Af Amer: >60 mL/min (ref >60)
GFR calc non Af Amer: >60 mL/min (ref >60)
GLUCOSE: 93 mg/dL (ref 65–99)
Potassium: 3.3 mmol/L — ABNORMAL LOW (ref 3.5–5.1)
SODIUM: 136 mmol/L (ref 135–145)
Total Protein: 7.7 g/dL (ref 6.5–8.1)

## 2015-12-12 LAB — RETICULOCYTES
RBC.: 2.67 MIL/uL — AB (ref 4.22–5.81)
Retic Count, Absolute: 170.9 10*3/uL (ref 19.0–186.0)
Retic Ct Pct: 6.4 % — ABNORMAL HIGH (ref 0.4–3.1)

## 2015-12-12 MED ORDER — ONDANSETRON HCL 4 MG/2ML IJ SOLN
4.0000 mg | Freq: Once | INTRAMUSCULAR | Status: AC
  Administered 2015-12-12: 4 mg via INTRAVENOUS
  Filled 2015-12-12: qty 2

## 2015-12-12 MED ORDER — DIPHENHYDRAMINE HCL 50 MG/ML IJ SOLN
25.0000 mg | Freq: Once | INTRAMUSCULAR | Status: AC
  Administered 2015-12-12: 25 mg via INTRAVENOUS
  Filled 2015-12-12: qty 1

## 2015-12-12 MED ORDER — HYDROMORPHONE HCL 2 MG/ML IJ SOLN
2.0000 mg | Freq: Once | INTRAMUSCULAR | Status: AC
  Administered 2015-12-12: 2 mg via INTRAVENOUS
  Filled 2015-12-12: qty 1

## 2015-12-12 MED ORDER — HEPARIN SOD (PORK) LOCK FLUSH 100 UNIT/ML IV SOLN
500.0000 [IU] | Freq: Once | INTRAVENOUS | Status: AC
  Administered 2015-12-12: 500 [IU]
  Filled 2015-12-12: qty 5

## 2015-12-12 MED ORDER — SODIUM CHLORIDE 0.9 % IV BOLUS (SEPSIS)
1000.0000 mL | Freq: Once | INTRAVENOUS | Status: AC
  Administered 2015-12-12: 1000 mL via INTRAVENOUS

## 2015-12-12 NOTE — ED Triage Notes (Signed)
Per pt, sickle cell pain-right and left flank, since yesterday

## 2015-12-12 NOTE — ED Provider Notes (Signed)
[facility] DEPT Provider Note   CSN: [id number] Arrival date & time: 12/12/15  1344     History   Chief Complaint Chief Complaint  Patient presents with  . Sickle Cell Pain Crisis    HPI [name] is a 35 y.o. male.  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 This is a typical place for his pain. No substernal chest pain, dyspnea, fever, sweats, chills, dysuria. He takes oral Dilaudid and MS Contin at home for pain control.      Past Medical History:  Diagnosis Date  . Acute chest syndrome ([place]) 06/18/2013  . Acute embolism and thrombosis of right internal jugular vein ([place])   . Alcohol consumption of one to four drinks per day   . Avascular necrosis (HCC)    Right Hip  . Blood transfusion   . Chronic anticoagulation   . Demand ischemia ([place]) 01/02/2014  . Former smoker   . Functional asplenia   . Hb-SS disease with crisis ([place])   . History of Clostridium difficile infection   . History of pulmonary embolus (PE)   . Hypertension   . Hypokalemia   . Leukocytosis    Chronic  . Mood disorder ([place])   . Noncompliance with medication regimen   . Oxygen deficiency   . Pulmonary hypertension   . Second hand tobacco smoke exposure   . Sickle cell anemia (HCC)   . Sickle-cell crisis with associated acute chest syndrome ([place]) 05/13/2013  . Stroke ([place])   . Thrombocytosis (HCC)    Chronic  . Uses marijuana     Patient Active Problem List   Diagnosis Date Noted  . Slurred speech 11/10/2015  . Pulmonary emboli ([place]) 11/10/2015  . Aphasia   . Acute chest syndrome ([place])   . Ischemic stroke ([place])   . Sickle cell crisis ([place]) 10/27/2015  . Sickle cell pain crisis ([place]) 09/18/2015  . Sickle cell anemia with crisis ([place]) 09/04/2015  . Shortness of breath   . Pulmonary embolism without acute cor pulmonale ([place])   . Fever   . Sickle cell anemia with pain ([place]) 05/07/2015  . Acute kidney injury ([place])   . Hypoxia   .  Anemia 01/31/2015  . Symptomatic anemia   . Hb-SS disease without crisis ([place]) 10/07/2014  . Acute on chronic respiratory failure with hypoxia ([place])   . PVC's (premature ventricular contractions) 09/10/2014  . Abnormal EKG 09/10/2014  . Chronic atrial fibrillation ([place]) 09/09/2014  . Cor pulmonale, chronic ([place]) 08/25/2014  . Sickle cell anemia ([place]) 06/25/2014  . Anemia of chronic disease 06/25/2014  . PAH (pulmonary artery hypertension) 03/18/2014  . Hb-SS disease with crisis ([place]) 01/22/2014  . Pulmonary embolus ([place]) 01/01/2014  . Paralytic strabismus, external ophthalmoplegia   . Chronic pain syndrome 12/12/2013  . Elevated troponin 11/26/2013  . Chronic anticoagulation 08/22/2013  . Essential hypertension 08/22/2013  . Pulmonary HTN 06/18/2013  . Functional asplenia   . Vitamin D deficiency 02/13/2013  . Hx of pulmonary embolus 06/29/2012  . Hemochromatosis 12/14/2011  . Avascular necrosis [facility])     Past Surgical History:  Procedure Laterality Date  . CHOLECYSTECTOMY     01/2008  . Excision of left periauricular cyst     10/2009  . Excision of right ear lobe cyst with primary closur     11/2007  . Porta cath placement    . Porta cath removal    . PORTACATH PLACEMENT  01/05/2012   Procedure:  INSERTION PORT-A-CATH;  Surgeon: Odis Hollingshead, MD;  Location: [facility];  Service: General;  Laterality: N/A;  ultrasound guiced port a cath insertion with fluoroscopy  . Right hip replacement     08/2006  . UMBILICAL HERNIA REPAIR     01/2008       Home Medications    Prior to Admission medications   Medication Sig Start Date End Date Taking? Authorizing Provider  ambrisentan (LETAIRIS) 5 MG tablet Take 5 mg by mouth at bedtime.   Yes Historical Provider, MD  aspirin 81 MG chewable tablet Chew 1 tablet (81 mg total) by mouth daily. 07/23/14  Yes [name], MD  cholecalciferol (VITAMIN D) 1000 units tablet Take 2,000 Units by mouth daily.   Yes Historical Provider,  MD  Deferasirox (JADENU) 360 MG TABS Take 1,080 mg by mouth daily.    Yes Historical Provider, MD  folic acid (FOLVITE) 1 MG tablet Take 1 mg by mouth daily.   Yes Historical Provider, MD  gabapentin (NEURONTIN) 300 MG capsule Take 1 capsule (300 mg total) by mouth 3 (three) times daily. 06/01/15  Yes Tresa Garter, MD  HYDROmorphone (DILAUDID) 4 MG tablet Take 1 tablet (4 mg total) by mouth every 4 (four) hours as needed for severe pain. 11/28/15 12/13/15 Yes Olugbemiga Essie Christine, MD  hydroxyurea (HYDREA) 500 MG capsule Take 1,000 mg by mouth daily. May take with food to minimize GI side effects.   Yes Historical Provider, MD  lisinopril (PRINIVIL,ZESTRIL) 5 MG tablet Take 1 tablet (5 mg total) by mouth daily. 08/04/15  Yes [name], MD  morphine (MS CONTIN) 30 MG 12 hr tablet Take 1 tablet (30 mg total) by mouth every 12 (twelve) hours. 12/06/15 01/05/16 Yes Tresa Garter, MD  rivaroxaban (XARELTO) 20 MG TABS tablet Take 20 mg by mouth at bedtime.    Yes Historical Provider, MD  torsemide (DEMADEX) 20 MG tablet Take 20 mg by mouth daily as needed (for weight gain greater than 3lbs.).    Yes Historical Provider, MD  zolpidem (AMBIEN) 10 MG tablet Take 1 tablet (10 mg total) by mouth at bedtime as needed for sleep. 12/06/15 01/05/16 Yes Tresa Garter, MD    Family History Family History  Problem Relation Age of Onset  . Sickle cell trait Mother   . Depression Mother   . Diabetes Mother   . Sickle cell trait Father   . Sickle cell trait Brother     Social History Social History  Substance Use Topics  . Smoking status: Former Smoker    Packs/day: 0.50    Years: 10.00    Types: Cigarettes    Quit date: 05/29/2011  . Smokeless tobacco: Never Used  . Alcohol use No     Allergies   Patient has no known allergies.   Review of Systems Review of Systems  All other systems reviewed and are negative.    Physical Exam Updated Vital Signs BP 109/78 (BP Location:  Left Arm)   Pulse 71   Temp 98.1 F (36.7 C) (Oral)   Resp 16   Ht 6' (1.829 m)   Wt 185 lb (83.9 kg)   SpO2 95%   BMI 25.09 kg/m   Physical Exam  Constitutional: He is oriented to person, place, and time.  Patient appears in no acute distress.  HENT:  Head: Normocephalic and atraumatic.  Eyes: Conjunctivae are normal.  Neck: Neck supple.  Cardiovascular: Normal rate and regular rhythm.   Pulmonary/Chest:  Effort normal and breath sounds normal.  Abdominal: Soft. Bowel sounds are normal.  Musculoskeletal: Normal range of motion.  Neurological: He is alert and oriented to person, place, and time.  Skin: Skin is warm and dry.  Psychiatric: He has a normal mood and affect. His behavior is normal.  Nursing note and vitals reviewed.    ED Treatments / Results  Labs (all labs ordered are listed, but only abnormal results are displayed) Labs Reviewed  COMPREHENSIVE METABOLIC PANEL - Abnormal; Notable for the following:       Result Value   Potassium 3.3 (*)    Total Bilirubin 3.5 (*)    All other components within normal limits  CBC WITH DIFFERENTIAL/PLATELET - Abnormal; Notable for the following:    WBC 11.5 (*)    RBC 2.67 (*)    Hemoglobin 8.0 (*)    HCT 23.9 (*)    RDW 18.9 (*)    Neutro Abs 7.9 (*)    Monocytes Absolute 1.7 (*)    All other components within normal limits  RETICULOCYTES - Abnormal; Notable for the following:    Retic Ct Pct 6.4 (*)    RBC. 2.67 (*)    All other components within normal limits    EKG  EKG Interpretation None       Radiology No results found.  Procedures Procedures (including critical care time)  Medications Ordered in ED Medications  ondansetron (ZOFRAN) injection 4 mg (4 mg Intravenous Given 12/12/15 1517)  sodium chloride 0.9 % bolus 1,000 mL (1,000 mLs Intravenous New Bag/Given 12/12/15 1500)  HYDROmorphone (DILAUDID) injection 2 mg (2 mg Intravenous Given 12/12/15 1517)  HYDROmorphone (DILAUDID) injection 2 mg (2  mg Intravenous Given 12/12/15 1630)  ondansetron (ZOFRAN) injection 4 mg (4 mg Intravenous Given 12/12/15 1629)  diphenhydrAMINE (BENADRYL) injection 25 mg (25 mg Intravenous Given 12/12/15 1826)  HYDROmorphone (DILAUDID) injection 2 mg (2 mg Intravenous Given 12/12/15 1855)     Initial Impression / Assessment and Plan / ED Course  I have reviewed the triage vital signs and the nursing notes.  Pertinent labs & imaging results that were available during my care of the patient were reviewed by me and considered in my medical decision making (see chart for details).  Clinical Course     Patient feels better after IV fluids and pain management. Screening labs are acceptable. Patient has requested discharge home.  Final Clinical Impressions(s) / ED Diagnoses   Final diagnoses:  Sickle cell pain crisis [facility])    New Prescriptions New Prescriptions   No medications on file     Nat Christen, MD 12/12/15 2000

## 2015-12-12 NOTE — Discharge Instructions (Signed)
Follow-up your primary care doctor. °

## 2015-12-14 ENCOUNTER — Telehealth (HOSPITAL_COMMUNITY): Payer: Self-pay | Admitting: *Deleted

## 2015-12-14 ENCOUNTER — Encounter (HOSPITAL_COMMUNITY): Payer: Self-pay | Admitting: *Deleted

## 2015-12-14 ENCOUNTER — Non-Acute Institutional Stay (HOSPITAL_BASED_OUTPATIENT_CLINIC_OR_DEPARTMENT_OTHER)
Admission: AD | Admit: 2015-12-14 | Discharge: 2015-12-14 | Disposition: A | Discharge status: Home / Self Care | Payer: Medicare Other | Source: Ambulatory Visit | Attending: Internal Medicine | Admitting: Internal Medicine

## 2015-12-14 DIAGNOSIS — Z86711 Personal history of pulmonary embolism: Secondary | ICD-10-CM

## 2015-12-14 DIAGNOSIS — K55039 Acute (reversible) ischemia of large intestine, extent unspecified: Secondary | ICD-10-CM | POA: Diagnosis not present

## 2015-12-14 DIAGNOSIS — Z7901 Long term (current) use of anticoagulants: Secondary | ICD-10-CM

## 2015-12-14 DIAGNOSIS — D5700 Hb-SS disease with crisis, unspecified: Secondary | ICD-10-CM

## 2015-12-14 DIAGNOSIS — Z9981 Dependence on supplemental oxygen: Secondary | ICD-10-CM | POA: Insufficient documentation

## 2015-12-14 DIAGNOSIS — I10 Essential (primary) hypertension: Secondary | ICD-10-CM | POA: Insufficient documentation

## 2015-12-14 DIAGNOSIS — Z7982 Long term (current) use of aspirin: Secondary | ICD-10-CM | POA: Insufficient documentation

## 2015-12-14 DIAGNOSIS — Z8673 Personal history of transient ischemic attack (TIA), and cerebral infarction without residual deficits: Secondary | ICD-10-CM

## 2015-12-14 DIAGNOSIS — Z96641 Presence of right artificial hip joint: Secondary | ICD-10-CM

## 2015-12-14 DIAGNOSIS — Z79899 Other long term (current) drug therapy: Secondary | ICD-10-CM | POA: Insufficient documentation

## 2015-12-14 DIAGNOSIS — Z87891 Personal history of nicotine dependence: Secondary | ICD-10-CM

## 2015-12-14 DIAGNOSIS — I272 Pulmonary hypertension, unspecified: Secondary | ICD-10-CM | POA: Insufficient documentation

## 2015-12-14 MED ORDER — DIPHENHYDRAMINE HCL 25 MG PO CAPS
25.0000 mg | ORAL_CAPSULE | ORAL | Status: DC | PRN
  Administered 2015-12-14: 25 mg via ORAL
  Filled 2015-12-14: qty 1

## 2015-12-14 MED ORDER — ONDANSETRON HCL 4 MG/2ML IJ SOLN: 4.0000 mg | Freq: Four times a day (QID) | INTRAMUSCULAR | Status: DC | PRN

## 2015-12-14 MED ORDER — HYDROMORPHONE HCL 2 MG/ML IJ SOLN: 1.0000 mg | INTRAMUSCULAR | Status: DC | PRN

## 2015-12-14 MED ORDER — SODIUM CHLORIDE 0.9% FLUSH: 9.0000 mL | INTRAVENOUS | Status: DC | PRN

## 2015-12-14 MED ORDER — HEPARIN SOD (PORK) LOCK FLUSH 100 UNIT/ML IV SOLN
500.0000 [IU] | INTRAVENOUS | Status: AC | PRN
  Administered 2015-12-14: 500 [IU]
  Filled 2015-12-14: qty 5

## 2015-12-14 MED ORDER — SENNOSIDES-DOCUSATE SODIUM 8.6-50 MG PO TABS: 1.0000 | ORAL_TABLET | Freq: Two times a day (BID) | ORAL | Status: DC

## 2015-12-14 MED ORDER — HYDROMORPHONE 1 MG/ML IV SOLN
INTRAVENOUS | Status: DC
  Administered 2015-12-14: 10.5 mg via INTRAVENOUS
  Administered 2015-12-14: 12 mg via INTRAVENOUS
  Administered 2015-12-14: 10:00:00 via INTRAVENOUS
  Filled 2015-12-14: qty 25

## 2015-12-14 MED ORDER — KETOROLAC TROMETHAMINE 30 MG/ML IJ SOLN
30.0000 mg | Freq: Four times a day (QID) | INTRAMUSCULAR | Status: DC
  Administered 2015-12-14: 30 mg via INTRAVENOUS
  Filled 2015-12-14: qty 1

## 2015-12-14 MED ORDER — SODIUM CHLORIDE 0.9% FLUSH
10.0000 mL | INTRAVENOUS | Status: AC | PRN
  Administered 2015-12-14: 10 mL

## 2015-12-14 MED ORDER — NALOXONE HCL 0.4 MG/ML IJ SOLN
0.4000 mg | INTRAMUSCULAR | Status: DC | PRN
Start: 2015-12-14 — End: 2015-12-14

## 2015-12-14 MED ORDER — SODIUM CHLORIDE 0.9 % IV SOLN
25.0000 mg | INTRAVENOUS | Status: DC | PRN
  Filled 2015-12-14: qty 0.5

## 2015-12-14 MED ORDER — POLYETHYLENE GLYCOL 3350 17 G PO PACK: 17.0000 g | PACK | Freq: Every day | ORAL | Status: DC | PRN

## 2015-12-14 MED ORDER — DEXTROSE-NACL 5-0.45 % IV SOLN
INTRAVENOUS | Status: DC
  Administered 2015-12-14: 10:00:00 via INTRAVENOUS

## 2015-12-14 NOTE — H&P (Signed)
[facility] History and Physical  [name] [id number] DOB: July 10, 1980 DOA: 12/14/2015  PCP: [name], MD   Chief Complaint: Pain  HPI: [name] is a 35 y.o. male with history of sickle cell disease associated with multiple complications, multiple PEs on chronic anticoagulations with doubtful adherence, Pulm HTN on home oxygen, non-adherent to oxygen use, recently had another PE and CVA, who [facility] with complaint of9/10 pain in his wrists, bilateral legs, back and bilateral rib pain. Denies fever, chest pain, N/V, Diarrhea, abdominal pain or priapism. [place] Dilaudid and MS Contin at 5 am without relief. He was seen in the ED 2 days ago for treatment for the same complaint.  Systemic Review: General: The patient denies anorexia, fever, weight loss Cardiac: Denies chest pain, syncope, palpitations, pedal edema  Respiratory: Denies cough, shortness of breath, wheezing GI: Denies severe indigestion/heartburn, abdominal pain, nausea, vomiting, diarrhea and constipation GU: Denies hematuria, incontinence, dysuria  Musculoskeletal: Denies arthritis  Skin: Denies suspicious skin lesions Neurologic: Denies focal weakness or numbness, change in vision  Past Medical History:  Diagnosis Date  . Acute chest syndrome ([place]) 06/18/2013  . Acute embolism and thrombosis of right internal jugular vein ([place])   . Alcohol consumption of one to four drinks per day   . Avascular necrosis (HCC)    Right Hip  . Blood transfusion   . Chronic anticoagulation   . Demand ischemia ([place]) 01/02/2014  . Former smoker   . Functional asplenia   . Hb-SS disease with crisis ([place])   . History of Clostridium difficile infection   . History of pulmonary embolus (PE)   . Hypertension   . Hypokalemia   . Leukocytosis    Chronic  . Mood disorder ([place])   . Noncompliance with medication regimen   . Oxygen deficiency   . Pulmonary  hypertension   . Second hand tobacco smoke exposure   . Sickle cell anemia (HCC)   . Sickle-cell crisis with associated acute chest syndrome ([place]) 05/13/2013  . Stroke ([place])   . Thrombocytosis (HCC)    Chronic  . Uses marijuana     Past Surgical History:  Procedure Laterality Date  . CHOLECYSTECTOMY     01/2008  . Excision of left periauricular cyst     10/2009  . Excision of right ear lobe cyst with primary closur     11/2007  . Porta cath placement    . Porta cath removal    . PORTACATH PLACEMENT  01/05/2012   Procedure: INSERTION PORT-A-CATH;  Surgeon: Odis Hollingshead, MD;  Location: [facility];  Service: General;  Laterality: N/A;  ultrasound guiced port a cath insertion with fluoroscopy  . Right hip replacement     08/2006  . UMBILICAL HERNIA REPAIR     01/2008    No Known Allergies  Family History  Problem Relation Age of Onset  . Sickle cell trait Mother   . Depression Mother   . Diabetes Mother   . Sickle cell trait Father   . Sickle cell trait Brother       Prior to Admission medications   Medication Sig Start Date End Date Taking? Authorizing Provider  ambrisentan (LETAIRIS) 5 MG tablet Take 5 mg by mouth at bedtime.   Yes Historical Provider, MD  aspirin 81 MG chewable tablet Chew 1 tablet (81 mg total) by mouth daily. 07/23/14  Yes [name], MD  cholecalciferol (VITAMIN D) 1000 units tablet  Take 2,000 Units by mouth daily.   Yes Historical Provider, MD  Deferasirox (JADENU) 360 MG TABS Take 1,080 mg by mouth daily.    Yes Historical Provider, MD  folic acid (FOLVITE) 1 MG tablet Take 1 mg by mouth daily.   Yes Historical Provider, MD  gabapentin (NEURONTIN) 300 MG capsule Take 1 capsule (300 mg total) by mouth 3 (three) times daily. 06/01/15  Yes Tresa Garter, MD  hydroxyurea (HYDREA) 500 MG capsule Take 1,000 mg by mouth daily. May take with food to minimize GI side effects.   Yes Historical Provider, MD  lisinopril (PRINIVIL,ZESTRIL) 5 MG tablet  Take 1 tablet (5 mg total) by mouth daily. 08/04/15  Yes [name], MD  morphine (MS CONTIN) 30 MG 12 hr tablet Take 1 tablet (30 mg total) by mouth every 12 (twelve) hours. 12/06/15 01/05/16 Yes Tresa Garter, MD  rivaroxaban (XARELTO) 20 MG TABS tablet Take 20 mg by mouth at bedtime.    Yes Historical Provider, MD  torsemide (DEMADEX) 20 MG tablet Take 20 mg by mouth daily as needed (for weight gain greater than 3lbs.).    Yes Historical Provider, MD  zolpidem (AMBIEN) 10 MG tablet Take 1 tablet (10 mg total) by mouth at bedtime as needed for sleep. 12/06/15 01/05/16 Yes Tresa Garter, MD     Physical Exam: Vitals:   12/14/15 0856  BP: (!) 104/56  Pulse: 86  Resp: (!) 24  Temp: 98.4 F (36.9 C)  TempSrc: Oral  SpO2: 99%  Weight: 185 lb (83.9 kg)  Height: 6' (1.829 m)    General: Alert, awake, afebrile, anicteric, not in obvious distress HEENT: Normocephalic and Atraumatic, Mucous membranes pink                PERRLA; EOM intact; No scleral icterus,                 Nares: Patent, Oropharynx: Clear, Fair Dentition                 Neck: FROM, no cervical lymphadenopathy, thyromegaly, carotid bruit or JVD;  CHEST: Normal respiration, clear to auscultation bilaterally  HEART: Regular rate and rhythm; Loud P2, ++ systolic murmurs, no rubs or gallops  BACK: No kyphosis or scoliosis; no CVA tenderness  ABDOMEN: Positive Bowel Sounds, soft, non-tender; no masses, no organomegaly EXTREMITIES: No cyanosis, clubbing, or edema SKIN:  no rash or ulceration  CNS: Alert and Oriented x 4, Nonfocal exam, CN 2-12 intact  Labs on Admission:  Basic Metabolic Panel:  Recent Labs Lab 12/12/15 1445  NA 136  K 3.3*  CL 106  CO2 24  GLUCOSE 93  BUN 8  CREATININE 0.66  CALCIUM 9.2   Liver Function Tests:  Recent Labs Lab 12/12/15 1445  AST 28  ALT 20  ALKPHOS 80  BILITOT 3.5*  PROT 7.7  ALBUMIN 4.5   No results for input(s): LIPASE, AMYLASE in the last 168  hours. No results for input(s): AMMONIA in the last 168 hours. CBC:  Recent Labs Lab 12/08/15 0547 12/09/15 1141 12/12/15 1445  WBC 10.6* 11.0* 11.5*  NEUTROABS 6.4 7.5 7.9*  HGB 6.1* 7.0* 8.0*  HCT 17.7* 20.2* 23.9*  MCV 88.1 86.0 89.5  PLT 260 251 373   Cardiac Enzymes: No results for input(s): CKTOTAL, CKMB, CKMBINDEX, TROPONINI in the last 168 hours.  BNP (last 3 results)  Recent Labs  08/02/15 2307 11/28/15 0520  BNP 503.2* 422.5*    ProBNP (last 3  results) No results for input(s): PROBNP in the last 8760 hours.  CBG: No results for input(s): GLUCAP in the last 168 hours.   Assessment/Plan Active Problems:   Sickle cell anemia with crisis ([place])   Admits to the Day Hospital  IVF D5 .45% Saline @ 125 mls/hour  Weight based Dilaudid PCA started within 30 minutes of admission plus clinician assisted doses of IV Dilaudid  IV Toradol 30 mg Q 6 H  Monitor vitals very closely, Re-evaluate pain scale every hour  2 L of Oxygen by [place]  Patient will be re-evaluated for pain in the context of function and relationship to baseline as care progresses.  If no significant relieve from pain (remains above 5/10) will transfer patient to inpatient services for further evaluation and management  Code Status: Full  Family Communication: None  DVT Prophylaxis: Ambulate as tolerated   Time spent: 24 Minutes  Anabelen Kaminsky, MD, MHA, FACP, FAAP, CPE  If 7PM-7AM, please contact night-coverage [email or web address] 12/14/2015, 9:23 AM

## 2015-12-14 NOTE — Progress Notes (Signed)
Patient ID: [name], male   DOB: 1980/08/07, 35 y.o.   MRN: [id number] Admitted for complaints of pain 8/10 in bilateral wrists and right side. Treated with IV fluids, IV Toradol and PCA Dilaudid. Port accessed without difficulty. Pain level down to 5/10 at time of discharge. Went over discharge instructions and copy given to patient. Alert, oriented and ambulatory at time of discharge. Called for a ride home. Discharged to home. Speech clear.

## 2015-12-14 NOTE — Discharge Summary (Signed)
Physician Discharge Summary  [name] [id number] DOB: January 25, 1981 DOA: 12/14/2015  PCP: [name], MD  Admit date: 12/14/2015  Discharge date: 12/14/2015  Time spent: 30 minutes  Discharge Diagnoses:  Active Problems:   Sickle cell anemia with crisis [facility])   Discharge Condition: Stable  Diet recommendation: Regular  Filed Weights   12/14/15 0856  Weight: 185 lb (83.9 kg)    History of present illness:  [name] is a 35 y.o. male with history of sickle cell disease associated with multiple complications, multiple PEs on chronic anticoagulations with doubtful adherence, Pulm HTN on home oxygen, non-adherent to oxygen use, recently had another PE and CVA, who [facility] with complaint of9/10 pain in his wrists, bilateral legs, back and bilateral rib pain. Denies fever, chest pain, N/V, Diarrhea, abdominal pain or priapism. [name] and MS Contin at 5 am without relief. He was seen in the ED 2 days ago for treatment for the same complaint.  Hospital Course:  [name] was admitted to the day hospital with sickle cell painful crisis. Patient was treated with weight based IV [name] PCA, IV [name] IV Toradol as well as IV fluids. [name] showed significant improvement symptomatically, pain improved from 9 to 5/10 at the time of discharge. Patient was discharged home in a hemodynamically stable condition. [name] will follow-up at the clinic as previously scheduled, continue with home medications as per prior to admission.  Discharge Instructions We discussed the need for good hydration, monitoring of hydration status, avoidance of heat, cold, stress, and infection triggers. We discussed the need to be compliant with taking Hydrea. [name] was reminded of the need to seek medical attention of any symptoms of bleeding, anemia, or infection occurs.  Discharge Exam: Vitals:   12/14/15 1256 12/14/15 1400  BP:  96/60 (!) 99/48  Pulse: 90 80  Resp: 18 19  Temp:     General appearance: alert, cooperative and no distress Eyes: conjunctivae/corneas clear. PERRL, EOM's intact. Fundi benign. Neck: no adenopathy, no carotid bruit, no JVD, supple, symmetrical, trachea midline and thyroid not enlarged, symmetric, no tenderness/mass/nodules Back: symmetric, no curvature. ROM normal. No CVA tenderness. Resp: clear to auscultation bilaterally Chest wall: no tenderness Cardio: regular rate and rhythm, S1, S2 normal, no murmur, click, rub or gallop GI: soft, non-tender; bowel sounds normal; no masses, no organomegaly Extremities: extremities normal, atraumatic, no cyanosis or edema Pulses: 2+ and symmetric Skin: Skin color, texture, turgor normal. No rashes or lesions Neurologic: Grossly normal  Current Discharge Medication List    CONTINUE these medications which have NOT CHANGED   Details  ambrisentan (LETAIRIS) 5 MG tablet Take 5 mg by mouth at bedtime.    aspirin 81 MG chewable tablet Chew 1 tablet (81 mg total) by mouth daily. Qty: 30 tablet, Refills: 11    cholecalciferol (VITAMIN D) 1000 units tablet Take 2,000 Units by mouth daily.    Deferasirox (JADENU) 360 MG TABS Take 1,080 mg by mouth daily.     folic acid (FOLVITE) 1 MG tablet Take 1 mg by mouth daily.    gabapentin (NEURONTIN) 300 MG capsule Take 1 capsule (300 mg total) by mouth 3 (three) times daily. Qty: 270 capsule, Refills: 3    hydroxyurea (HYDREA) 500 MG capsule Take 1,000 mg by mouth daily. May take with food to minimize GI side effects.    lisinopril (PRINIVIL,ZESTRIL) 5 MG tablet Take 1 tablet (5 mg total) by mouth daily. Qty: 30 tablet, Refills:  1    morphine (MS CONTIN) 30 MG 12 hr tablet Take 1 tablet (30 mg total) by mouth every 12 (twelve) hours. Qty: 60 tablet, Refills: 0    rivaroxaban (XARELTO) 20 MG TABS tablet Take 20 mg by mouth at bedtime.     torsemide (DEMADEX) 20 MG tablet Take 20 mg by mouth daily as  needed (for weight gain greater than 3lbs.).     zolpidem (AMBIEN) 10 MG tablet Take 1 tablet (10 mg total) by mouth at bedtime as needed for sleep. Qty: 30 tablet, Refills: 0   Associated Diagnoses: Primary insomnia       No Known Allergies   Significant Diagnostic Studies: Dg Chest 2 View  Result Date: 11/27/2015 CLINICAL DATA:  Sickle cell disease.  Chest pain for 2-3 days. EXAM: CHEST  2 VIEW COMPARISON:  Chest radiograph 11/23/2015 FINDINGS: There is a left chest wall Port-A-Cath with the tip in the proximal right atrium, unchanged. Unchanged cardiomegaly. No pulmonary edema. No focal airspace consolidation. No pleural effusion or pneumothorax. Unchanged hazy opacities and nodular density of the right mid lung. IMPRESSION: Unchanged cardiomegaly without overt pulmonary edema. Unchanged hazy opacities in the right mid lung. Electronically Signed   By: Ulyses Jarred M.D.   On: 11/27/2015 23:05   Dg Chest 2 View  Result Date: 11/23/2015 CLINICAL DATA:  Bilateral side pain for 1 week with hypoxia. EXAM: CHEST  2 VIEW COMPARISON:  11/22/2015 FINDINGS: The lungs are clear wiithout focal pneumonia, edema, pneumothorax or pleural effusion. The cardio pericardial silhouette is enlarged. There is pulmonary vascular congestion without overt pulmonary edema. Nodular areas in the peripheral aspect of the right mid lung were seen on CT scan from 2 weeks ago. Left Port-A-Cath tip projects at the SVC/RA junction. Telemetry leads overlie the chest. IMPRESSION: Cardiomegaly with pulmonary vascular congestion. Nodular density right mid lung better seen on recent CT scan. Electronically Signed   By: Misty Stanley M.D.   On: 11/23/2015 10:34   Dg Chest Port 1 View  Result Date: 11/22/2015 CLINICAL DATA:  Sickle-cell crisis, acute chest syndrome EXAM: PORTABLE CHEST 1 VIEW COMPARISON:  11/10/2015 FINDINGS: Marked cardiomegaly with mild vascular congestion and diffuse interstitial prominence. Basilar  peripheral Kerley B-lines noted suspicious for mild basilar interstitial edema pattern. There is associated increased bibasilar atelectasis. No large effusion or pneumothorax. Trachea is midline. Left subclavian power port catheter tip at the SVC RA junction. IMPRESSION: Marked cardiomegaly with mild basilar interstitial edema pattern and increased basilar atelectasis. Electronically Signed   By: Jerilynn Mages.  Shick M.D.   On: 11/22/2015 08:40    Signed:  [name] MD, [place], [place], [place], CPE   12/14/2015, 4:09 PM

## 2015-12-14 NOTE — Telephone Encounter (Signed)
After speaking with Dr. Doreene Burke, RN called patient back that he could come in for treatment.  Pt stated that he could be here in 30 -45 mins.

## 2015-12-14 NOTE — Discharge Instructions (Signed)

## 2015-12-14 NOTE — Telephone Encounter (Addendum)
Pt called requesting to come to the [facility] for treatment. Pt states his pain is 9/10 and in his wrist and right side. Pt denies fever, chest pain, nausea, vomiting, diarrhea, priapism or abdominal pain. Pt last had his pain med at 5am this morning of Dilaudid and MS Contin. Will check with the provider and give him a call back, voiced understanding.

## 2015-12-15 ENCOUNTER — Encounter (HOSPITAL_COMMUNITY): Payer: Self-pay | Admitting: Emergency Medicine

## 2015-12-15 ENCOUNTER — Emergency Department (HOSPITAL_COMMUNITY): Payer: Medicare Other

## 2015-12-15 ENCOUNTER — Inpatient Hospital Stay (HOSPITAL_COMMUNITY)
Admission: EM | Admit: 2015-12-15 | Discharge: 2015-12-22 | DRG: 393 | Disposition: A | Discharge status: Home / Self Care | Payer: Medicare Other | Attending: Internal Medicine | Admitting: Internal Medicine

## 2015-12-15 DIAGNOSIS — Z86718 Personal history of other venous thrombosis and embolism: Secondary | ICD-10-CM | POA: Diagnosis not present

## 2015-12-15 DIAGNOSIS — R1084 Generalized abdominal pain: Secondary | ICD-10-CM | POA: Diagnosis not present

## 2015-12-15 DIAGNOSIS — R109 Unspecified abdominal pain: Secondary | ICD-10-CM | POA: Diagnosis present

## 2015-12-15 DIAGNOSIS — K432 Incisional hernia without obstruction or gangrene: Secondary | ICD-10-CM | POA: Diagnosis not present

## 2015-12-15 DIAGNOSIS — D649 Anemia, unspecified: Secondary | ICD-10-CM

## 2015-12-15 DIAGNOSIS — Z9119 Patient's noncompliance with other medical treatment and regimen: Secondary | ICD-10-CM | POA: Diagnosis not present

## 2015-12-15 DIAGNOSIS — I10 Essential (primary) hypertension: Secondary | ICD-10-CM | POA: Diagnosis not present

## 2015-12-15 DIAGNOSIS — R1010 Upper abdominal pain, unspecified: Secondary | ICD-10-CM | POA: Diagnosis not present

## 2015-12-15 DIAGNOSIS — R0902 Hypoxemia: Secondary | ICD-10-CM

## 2015-12-15 DIAGNOSIS — J9611 Chronic respiratory failure with hypoxia: Secondary | ICD-10-CM | POA: Diagnosis not present

## 2015-12-15 DIAGNOSIS — Z86711 Personal history of pulmonary embolism: Secondary | ICD-10-CM | POA: Diagnosis not present

## 2015-12-15 DIAGNOSIS — D539 Nutritional anemia, unspecified: Secondary | ICD-10-CM | POA: Diagnosis not present

## 2015-12-15 DIAGNOSIS — D729 Disorder of white blood cells, unspecified: Secondary | ICD-10-CM

## 2015-12-15 DIAGNOSIS — K529 Noninfective gastroenteritis and colitis, unspecified: Secondary | ICD-10-CM

## 2015-12-15 DIAGNOSIS — Z96641 Presence of right artificial hip joint: Secondary | ICD-10-CM | POA: Diagnosis not present

## 2015-12-15 DIAGNOSIS — R112 Nausea with vomiting, unspecified: Secondary | ICD-10-CM

## 2015-12-15 DIAGNOSIS — Z87891 Personal history of nicotine dependence: Secondary | ICD-10-CM | POA: Diagnosis not present

## 2015-12-15 DIAGNOSIS — D72828 Other elevated white blood cell count: Secondary | ICD-10-CM

## 2015-12-15 DIAGNOSIS — Z7901 Long term (current) use of anticoagulants: Secondary | ICD-10-CM | POA: Diagnosis not present

## 2015-12-15 DIAGNOSIS — K55039 Acute (reversible) ischemia of large intestine, extent unspecified: Secondary | ICD-10-CM | POA: Diagnosis not present

## 2015-12-15 DIAGNOSIS — Z9981 Dependence on supplemental oxygen: Secondary | ICD-10-CM | POA: Diagnosis not present

## 2015-12-15 DIAGNOSIS — I2729 Other secondary pulmonary hypertension: Secondary | ICD-10-CM | POA: Diagnosis not present

## 2015-12-15 DIAGNOSIS — D5700 Hb-SS disease with crisis, unspecified: Secondary | ICD-10-CM

## 2015-12-15 DIAGNOSIS — Z8673 Personal history of transient ischemic attack (TIA), and cerebral infarction without residual deficits: Secondary | ICD-10-CM | POA: Diagnosis not present

## 2015-12-15 DIAGNOSIS — F1120 Opioid dependence, uncomplicated: Secondary | ICD-10-CM | POA: Diagnosis not present

## 2015-12-15 LAB — URINE MICROSCOPIC-ADD ON

## 2015-12-15 LAB — CBC WITH DIFFERENTIAL/PLATELET
BASOS ABS: 0.1 10*3/uL (ref 0.0–0.1)
BASOS PCT: 0 %
EOS ABS: 0 10*3/uL (ref 0.0–0.7)
Eosinophils Relative: 0 %
HEMATOCRIT: 22.4 % — AB (ref 39.0–52.0)
HEMOGLOBIN: 7.6 g/dL — AB (ref 13.0–17.0)
Lymphocytes Relative: 4 %
Lymphs Abs: 0.9 10*3/uL (ref 0.7–4.0)
MCH: 30.6 pg (ref 26.0–34.0)
MCHC: 33.9 g/dL (ref 30.0–36.0)
MCV: 90.3 fL (ref 78.0–100.0)
Monocytes Absolute: 1.6 10*3/uL — ABNORMAL HIGH (ref 0.1–1.0)
Monocytes Relative: 7 %
NEUTROS ABS: 20 10*3/uL — AB (ref 1.7–7.7)
NEUTROS PCT: 89 %
Platelets: 433 10*3/uL — ABNORMAL HIGH (ref 150–400)
RBC: 2.48 MIL/uL — AB (ref 4.22–5.81)
RDW: 19.3 % — ABNORMAL HIGH (ref 11.5–15.5)
WBC: 22.5 10*3/uL — AB (ref 4.0–10.5)

## 2015-12-15 LAB — COMPREHENSIVE METABOLIC PANEL
ALK PHOS: 79 U/L (ref 38–126)
ALT: 18 U/L (ref 17–63)
ANION GAP: 6 (ref 5–15)
AST: 25 U/L (ref 15–41)
Albumin: 4.5 g/dL (ref 3.5–5.0)
BUN: 21 mg/dL — ABNORMAL HIGH (ref 6–20)
CALCIUM: 8.9 mg/dL (ref 8.9–10.3)
CO2: 22 mmol/L (ref 22–32)
CREATININE: 0.95 mg/dL (ref 0.61–1.24)
Chloride: 110 mmol/L (ref 101–111)
Glucose, Bld: 123 mg/dL — ABNORMAL HIGH (ref 65–99)
Potassium: 3.7 mmol/L (ref 3.5–5.1)
SODIUM: 138 mmol/L (ref 135–145)
Total Bilirubin: 4.8 mg/dL — ABNORMAL HIGH (ref 0.3–1.2)
Total Protein: 7.2 g/dL (ref 6.5–8.1)

## 2015-12-15 LAB — URINALYSIS, ROUTINE W REFLEX MICROSCOPIC
BILIRUBIN URINE: NEGATIVE
Glucose, UA: NEGATIVE mg/dL
KETONES UR: NEGATIVE mg/dL
LEUKOCYTES UA: NEGATIVE
NITRITE: NEGATIVE
PROTEIN: NEGATIVE mg/dL
Specific Gravity, Urine: 1.013 (ref 1.005–1.030)
pH: 6.5 (ref 5.0–8.0)

## 2015-12-15 LAB — LACTATE DEHYDROGENASE: LDH: 328 U/L — ABNORMAL HIGH (ref 98–192)

## 2015-12-15 LAB — RETICULOCYTES
RBC.: 2.51 MIL/uL — ABNORMAL LOW (ref 4.22–5.81)
RETIC COUNT ABSOLUTE: 133 10*3/uL (ref 19.0–186.0)
RETIC CT PCT: 5.3 % — AB (ref 0.4–3.1)

## 2015-12-15 LAB — I-STAT TROPONIN, ED: TROPONIN I, POC: 0 ng/mL (ref 0.00–0.08)

## 2015-12-15 LAB — LACTIC ACID, PLASMA: LACTIC ACID, VENOUS: 0.7 mmol/L (ref 0.5–1.9)

## 2015-12-15 LAB — LIPASE, BLOOD: LIPASE: 43 U/L (ref 11–51)

## 2015-12-15 MED ORDER — MORPHINE SULFATE ER 30 MG PO TBCR
30.0000 mg | EXTENDED_RELEASE_TABLET | Freq: Two times a day (BID) | ORAL | Status: DC
  Administered 2015-12-15 – 2015-12-22 (×14): 30 mg via ORAL
  Filled 2015-12-15 (×14): qty 1

## 2015-12-15 MED ORDER — ZOLPIDEM TARTRATE 10 MG PO TABS
10.0000 mg | ORAL_TABLET | Freq: Every evening | ORAL | Status: DC | PRN
  Administered 2015-12-15 – 2015-12-21 (×7): 10 mg via ORAL
  Filled 2015-12-15 (×7): qty 1

## 2015-12-15 MED ORDER — HYDROMORPHONE HCL 2 MG/ML IJ SOLN
2.0000 mg | INTRAMUSCULAR | Status: AC
  Administered 2015-12-15: 2 mg via INTRAVENOUS
  Filled 2015-12-15: qty 1

## 2015-12-15 MED ORDER — FOLIC ACID 1 MG PO TABS
1.0000 mg | ORAL_TABLET | Freq: Every day | ORAL | Status: DC
  Administered 2015-12-16 – 2015-12-22 (×7): 1 mg via ORAL
  Filled 2015-12-15 (×7): qty 1

## 2015-12-15 MED ORDER — HYDROMORPHONE HCL 2 MG/ML IJ SOLN
2.0000 mg | INTRAMUSCULAR | Status: AC
Start: 2015-12-15 — End: 2015-12-15

## 2015-12-15 MED ORDER — HYDROXYUREA 500 MG PO CAPS
1000.0000 mg | ORAL_CAPSULE | Freq: Every day | ORAL | Status: DC
  Administered 2015-12-16 – 2015-12-22 (×7): 1000 mg via ORAL
  Filled 2015-12-15 (×7): qty 2

## 2015-12-15 MED ORDER — METRONIDAZOLE IN NACL 5-0.79 MG/ML-% IV SOLN
500.0000 mg | Freq: Three times a day (TID) | INTRAVENOUS | Status: DC
  Administered 2015-12-15 – 2015-12-16 (×3): 500 mg via INTRAVENOUS
  Filled 2015-12-15 (×3): qty 100

## 2015-12-15 MED ORDER — IOPAMIDOL (ISOVUE-300) INJECTION 61%
100.0000 mL | Freq: Once | INTRAVENOUS | Status: AC | PRN
  Administered 2015-12-15: 100 mL via INTRAVENOUS

## 2015-12-15 MED ORDER — HYDROMORPHONE HCL 1 MG/ML IJ SOLN
1.0000 mg | Freq: Once | INTRAMUSCULAR | Status: AC
  Administered 2015-12-15: 1 mg via INTRAVENOUS
  Filled 2015-12-15: qty 1

## 2015-12-15 MED ORDER — AMBRISENTAN 5 MG PO TABS
5.0000 mg | ORAL_TABLET | Freq: Every day | ORAL | Status: DC
  Administered 2015-12-17 – 2015-12-21 (×5): 5 mg via ORAL

## 2015-12-15 MED ORDER — KETOROLAC TROMETHAMINE 30 MG/ML IJ SOLN
30.0000 mg | Freq: Four times a day (QID) | INTRAMUSCULAR | Status: AC
  Administered 2015-12-15 – 2015-12-20 (×20): 30 mg via INTRAVENOUS
  Filled 2015-12-15 (×20): qty 1

## 2015-12-15 MED ORDER — HYDROMORPHONE HCL 2 MG/ML IJ SOLN: 2.0000 mg | INTRAMUSCULAR | Status: AC

## 2015-12-15 MED ORDER — IOPAMIDOL (ISOVUE-300) INJECTION 61%
15.0000 mL | Freq: Once | INTRAVENOUS | Status: AC | PRN
  Administered 2015-12-15: 15 mL via ORAL

## 2015-12-15 MED ORDER — NALOXONE HCL 0.4 MG/ML IJ SOLN: 0.4000 mg | INTRAMUSCULAR | Status: DC | PRN

## 2015-12-15 MED ORDER — ASPIRIN 81 MG PO CHEW
81.0000 mg | CHEWABLE_TABLET | Freq: Every day | ORAL | Status: DC
  Administered 2015-12-16 – 2015-12-22 (×7): 81 mg via ORAL
  Filled 2015-12-15 (×7): qty 1

## 2015-12-15 MED ORDER — DIPHENHYDRAMINE HCL 25 MG PO CAPS
25.0000 mg | ORAL_CAPSULE | ORAL | Status: DC | PRN
  Administered 2015-12-17: 50 mg via ORAL
  Administered 2015-12-18: 25 mg via ORAL
  Administered 2015-12-18 – 2015-12-20 (×2): 50 mg via ORAL
  Filled 2015-12-15: qty 1
  Filled 2015-12-15 (×3): qty 2

## 2015-12-15 MED ORDER — VITAMIN D3 25 MCG (1000 UNIT) PO TABS
2000.0000 [IU] | ORAL_TABLET | Freq: Every day | ORAL | Status: DC
  Administered 2015-12-16 – 2015-12-22 (×7): 2000 [IU] via ORAL
  Filled 2015-12-15 (×7): qty 2

## 2015-12-15 MED ORDER — DEXTROSE-NACL 5-0.45 % IV SOLN
INTRAVENOUS | Status: DC
  Administered 2015-12-15 – 2015-12-18 (×6): via INTRAVENOUS

## 2015-12-15 MED ORDER — LISINOPRIL 10 MG PO TABS
5.0000 mg | ORAL_TABLET | Freq: Every day | ORAL | Status: DC
  Administered 2015-12-16 – 2015-12-22 (×7): 5 mg via ORAL
  Filled 2015-12-15 (×7): qty 1

## 2015-12-15 MED ORDER — HYDROMORPHONE 1 MG/ML IV SOLN
INTRAVENOUS | Status: DC
  Administered 2015-12-15: 18:00:00 via INTRAVENOUS
  Administered 2015-12-15: 5.4 mg via INTRAVENOUS
  Administered 2015-12-16: 1.8 mg via INTRAVENOUS
  Administered 2015-12-16: 10.2 mg via INTRAVENOUS
  Administered 2015-12-16: 8.17 mg via INTRAVENOUS
  Administered 2015-12-16: 9.6 mg via INTRAVENOUS
  Administered 2015-12-16: 18 mg via INTRAVENOUS
  Administered 2015-12-16: 16:00:00 via INTRAVENOUS
  Administered 2015-12-16: 5.4 mg via INTRAVENOUS
  Administered 2015-12-17: 3 mg via INTRAVENOUS
  Administered 2015-12-17: 6 mg via INTRAVENOUS
  Administered 2015-12-17: 4.2 mg via INTRAVENOUS
  Administered 2015-12-17: 8.4 mg via INTRAVENOUS
  Administered 2015-12-17: 4.8 mg via INTRAVENOUS
  Administered 2015-12-17: 4.2 mg via INTRAVENOUS
  Administered 2015-12-18: 1.8 mg via INTRAVENOUS
  Administered 2015-12-18: 9.59 mg via INTRAVENOUS
  Administered 2015-12-18: 3.8 mg via INTRAVENOUS
  Filled 2015-12-15 (×5): qty 25

## 2015-12-15 MED ORDER — SODIUM CHLORIDE 0.9% FLUSH: 10.0000 mL | INTRAVENOUS | Status: DC | PRN

## 2015-12-15 MED ORDER — TORSEMIDE 20 MG PO TABS
20.0000 mg | ORAL_TABLET | Freq: Every day | ORAL | Status: DC | PRN
  Filled 2015-12-15: qty 1

## 2015-12-15 MED ORDER — CIPROFLOXACIN IN D5W 400 MG/200ML IV SOLN
400.0000 mg | Freq: Two times a day (BID) | INTRAVENOUS | Status: DC
  Administered 2015-12-15 – 2015-12-16 (×2): 400 mg via INTRAVENOUS
  Filled 2015-12-15 (×2): qty 200

## 2015-12-15 MED ORDER — DIPHENHYDRAMINE HCL 50 MG/ML IJ SOLN
25.0000 mg | Freq: Once | INTRAMUSCULAR | Status: AC
  Administered 2015-12-15: 25 mg via INTRAVENOUS
  Filled 2015-12-15: qty 1

## 2015-12-15 MED ORDER — HYDROMORPHONE HCL 2 MG/ML IJ SOLN
2.0000 mg | INTRAMUSCULAR | Status: AC
Start: 2015-12-15 — End: 2015-12-15
  Administered 2015-12-15: 2 mg via INTRAVENOUS
  Filled 2015-12-15: qty 1

## 2015-12-15 MED ORDER — SODIUM CHLORIDE 0.9% FLUSH: 9.0000 mL | INTRAVENOUS | Status: DC | PRN

## 2015-12-15 MED ORDER — CIPROFLOXACIN IN D5W 400 MG/200ML IV SOLN
400.0000 mg | Freq: Once | INTRAVENOUS | Status: AC
  Administered 2015-12-15: 400 mg via INTRAVENOUS
  Filled 2015-12-15: qty 200

## 2015-12-15 MED ORDER — GABAPENTIN 300 MG PO CAPS
300.0000 mg | ORAL_CAPSULE | Freq: Three times a day (TID) | ORAL | Status: DC
Start: 2015-12-15 — End: 2015-12-22
  Administered 2015-12-15 – 2015-12-22 (×21): 300 mg via ORAL
  Filled 2015-12-15 (×21): qty 1

## 2015-12-15 MED ORDER — ONDANSETRON HCL 4 MG/2ML IJ SOLN: 4.0000 mg | Freq: Four times a day (QID) | INTRAMUSCULAR | Status: DC | PRN

## 2015-12-15 MED ORDER — RIVAROXABAN 20 MG PO TABS
20.0000 mg | ORAL_TABLET | Freq: Every day | ORAL | Status: DC
  Administered 2015-12-15: 20 mg via ORAL
  Filled 2015-12-15: qty 1

## 2015-12-15 MED ORDER — SODIUM CHLORIDE 0.9 % IV SOLN
25.0000 mg | INTRAVENOUS | Status: DC | PRN
  Filled 2015-12-15: qty 0.5

## 2015-12-15 MED ORDER — METRONIDAZOLE IN NACL 5-0.79 MG/ML-% IV SOLN
500.0000 mg | Freq: Once | INTRAVENOUS | Status: AC
  Administered 2015-12-15: 500 mg via INTRAVENOUS
  Filled 2015-12-15: qty 100

## 2015-12-15 MED ORDER — ONDANSETRON HCL 4 MG/2ML IJ SOLN
4.0000 mg | INTRAMUSCULAR | Status: DC | PRN
  Administered 2015-12-15: 4 mg via INTRAVENOUS
  Filled 2015-12-15: qty 2

## 2015-12-15 NOTE — ED Triage Notes (Signed)
Pt c/o diffuse periumbilical abdominal pain, emesis onset this morning. No diarrhea.

## 2015-12-15 NOTE — ED Notes (Signed)
Bed: WA16 Expected date:  Expected time:  Means of arrival:  Comments: EMS/AMS 

## 2015-12-15 NOTE — ED Provider Notes (Signed)
[facility] DEPT Provider Note   CSN: [id number] Arrival date & time: 12/15/15  1017     History   Chief Complaint No chief complaint on file.   HPI [name] is a 35 y.o. male with a PMHx of sickle cell anemia, HTN, remote PE on chronic xarelto, chronic thrombocytosis, and a PSHx of cholecystectomy, who presents to the ED with complaints of periumbilical pain that began at 7 AM ~4 hours ago. Patient describes the pain is 10/10 constant squeezing nonradiating periumbilical pain with no known aggravating factors and on improved with Dilaudid 4 mg 2-3 doses (patient is prescribed 4 mg every 4-6 hours as needed for pain, admits that he took more than his recommended amount). Associated symptoms include 4 episodes of nonbloody nonbilious emesis and nausea. Patient states that he doesn't typically get abdominal pain with his sickle cell crises, although he has in the past. Was recently discharged yesterday after sickle cell pain crisis in his ribs and extremities.  He denies any fevers, chills, chest pain, shortness breath, hematemesis, melena, hematochezia, obstipation, constipation, diarrhea, dysuria, hematuria, numbness, tingling, focal weakness, leg swelling, leg ulcerations or skin changes, recent travel, sick contacts, suspicious food intake, alcohol use, or chronic NSAID use. Reports compliance with xarelto.    The history is provided by the patient and medical records. No language interpreter was used.  Abdominal Pain   This is a new problem. The current episode started 3 to 5 hours ago. The problem occurs constantly. The problem has not changed since onset.The pain is associated with an unknown factor. The pain is located in the periumbilical region. Quality: squeezing. The pain is at a severity of 10/10. The pain is severe. Associated symptoms include nausea and vomiting. Pertinent negatives include fever, diarrhea, flatus, hematochezia, melena, constipation, dysuria,  hematuria, arthralgias and myalgias. Nothing aggravates the symptoms. Nothing relieves the symptoms.    Past Medical History:  Diagnosis Date  . Acute chest syndrome ([place]) 06/18/2013  . Acute embolism and thrombosis of right internal jugular vein ([place])   . Alcohol consumption of one to four drinks per day   . Avascular necrosis (HCC)    Right Hip  . Blood transfusion   . Chronic anticoagulation   . Demand ischemia ([place]) 01/02/2014  . Former smoker   . Functional asplenia   . Hb-SS disease with crisis ([place])   . History of Clostridium difficile infection   . History of pulmonary embolus (PE)   . Hypertension   . Hypokalemia   . Leukocytosis    Chronic  . Mood disorder ([place])   . Noncompliance with medication regimen   . Oxygen deficiency   . Pulmonary hypertension   . Second hand tobacco smoke exposure   . Sickle cell anemia (HCC)   . Sickle-cell crisis with associated acute chest syndrome ([place]) 05/13/2013  . Stroke ([place])   . Thrombocytosis (HCC)    Chronic  . Uses marijuana     Patient Active Problem List   Diagnosis Date Noted  . Slurred speech 11/10/2015  . Pulmonary emboli ([place]) 11/10/2015  . Aphasia   . Acute chest syndrome ([place])   . Ischemic stroke ([place])   . Sickle cell crisis ([place]) 10/27/2015  . Sickle cell pain crisis ([place]) 09/18/2015  . Sickle cell anemia with crisis ([place]) 09/04/2015  . Shortness of breath   . Pulmonary embolism without acute cor pulmonale ([place])   . Fever   . Sickle cell anemia with pain ([place]) 05/07/2015  . Acute  kidney injury ([place])   . Hypoxia   . Anemia 01/31/2015  . Symptomatic anemia   . Hb-SS disease without crisis ([place]) 10/07/2014  . Acute on chronic respiratory failure with hypoxia ([place])   . PVC's (premature ventricular contractions) 09/10/2014  . Abnormal EKG 09/10/2014  . Chronic atrial fibrillation ([place]) 09/09/2014  . Cor pulmonale, chronic ([place]) 08/25/2014  . Sickle cell anemia ([place]) 06/25/2014  . Anemia of chronic disease  06/25/2014  . PAH (pulmonary artery hypertension) 03/18/2014  . Hb-SS disease with crisis ([place]) 01/22/2014  . Pulmonary embolus ([place]) 01/01/2014  . Paralytic strabismus, external ophthalmoplegia   . Chronic pain syndrome 12/12/2013  . Elevated troponin 11/26/2013  . Chronic anticoagulation 08/22/2013  . Essential hypertension 08/22/2013  . Pulmonary HTN 06/18/2013  . Functional asplenia   . Vitamin D deficiency 02/13/2013  . Hx of pulmonary embolus 06/29/2012  . Hemochromatosis 12/14/2011  . Avascular necrosis [facility])     Past Surgical History:  Procedure Laterality Date  . CHOLECYSTECTOMY     01/2008  . Excision of left periauricular cyst     10/2009  . Excision of right ear lobe cyst with primary closur     11/2007  . Porta cath placement    . Porta cath removal    . PORTACATH PLACEMENT  01/05/2012   Procedure: INSERTION PORT-A-CATH;  Surgeon: Odis Hollingshead, MD;  Location: [facility];  Service: General;  Laterality: N/A;  ultrasound guiced port a cath insertion with fluoroscopy  . Right hip replacement     08/2006  . UMBILICAL HERNIA REPAIR     01/2008       Home Medications    Prior to Admission medications   Medication Sig Start Date End Date Taking? Authorizing Provider  ambrisentan (LETAIRIS) 5 MG tablet Take 5 mg by mouth at bedtime.   Yes Historical Provider, MD  aspirin 81 MG chewable tablet Chew 1 tablet (81 mg total) by mouth daily. 07/23/14  Yes [name], MD  cholecalciferol (VITAMIN D) 1000 units tablet Take 2,000 Units by mouth daily.   Yes Historical Provider, MD  Deferasirox (JADENU) 360 MG TABS Take 1,080 mg by mouth daily.    Yes Historical Provider, MD  folic acid (FOLVITE) 1 MG tablet Take 1 mg by mouth daily.   Yes Historical Provider, MD  gabapentin (NEURONTIN) 300 MG capsule Take 1 capsule (300 mg total) by mouth 3 (three) times daily. 06/01/15  Yes Tresa Garter, MD  hydroxyurea (HYDREA) 500 MG capsule Take 1,000 mg by mouth daily. May  take with food to minimize GI side effects.   Yes Historical Provider, MD  lisinopril (PRINIVIL,ZESTRIL) 5 MG tablet Take 1 tablet (5 mg total) by mouth daily. 08/04/15  Yes [name], MD  morphine (MS CONTIN) 30 MG 12 hr tablet Take 1 tablet (30 mg total) by mouth every 12 (twelve) hours. 12/06/15 01/05/16 Yes Tresa Garter, MD  rivaroxaban (XARELTO) 20 MG TABS tablet Take 20 mg by mouth at bedtime.    Yes Historical Provider, MD  torsemide (DEMADEX) 20 MG tablet Take 20 mg by mouth daily as needed (for weight gain greater than 3lbs.).    Yes Historical Provider, MD  zolpidem (AMBIEN) 10 MG tablet Take 1 tablet (10 mg total) by mouth at bedtime as needed for sleep. 12/06/15 01/05/16 Yes Tresa Garter, MD    Family History Family History  Problem Relation Age of Onset  . Sickle cell trait Mother   . Depression  Mother   . Diabetes Mother   . Sickle cell trait Father   . Sickle cell trait Brother     Social History Social History  Substance Use Topics  . Smoking status: Former Smoker    Packs/day: 0.50    Years: 10.00    Types: Cigarettes    Quit date: 05/29/2011  . Smokeless tobacco: Never Used  . Alcohol use No     Allergies   Patient has no known allergies.   Review of Systems Review of Systems  Constitutional: Negative for chills and fever.  Respiratory: Negative for shortness of breath.   Cardiovascular: Negative for chest pain and leg swelling.  Gastrointestinal: Positive for abdominal pain, nausea and vomiting. Negative for blood in stool, constipation, diarrhea, flatus, hematochezia and melena.  Genitourinary: Negative for dysuria and hematuria.  Musculoskeletal: Negative for arthralgias and myalgias.  Skin: Negative for color change and wound.  Allergic/Immunologic: Positive for immunocompromised state (sickle cell).  Neurological: Negative for weakness and numbness.  Psychiatric/Behavioral: Negative for confusion.   10 Systems reviewed and are  negative for acute change except as noted in the HPI.   Physical Exam Updated Vital Signs BP 118/78 (BP Location: Right Arm)   Pulse 80   Temp 97.8 F (36.6 C) (Oral)   Resp 18   SpO2 93%   Physical Exam  Constitutional: He is oriented to person, place, and time. Vital signs are normal. He appears well-developed and well-nourished.  Non-toxic appearance. No distress.  Afebrile, nontoxic, NAD  HENT:  Head: Normocephalic and atraumatic.  Mouth/Throat: Oropharynx is clear and moist and mucous membranes are normal.  Eyes: Conjunctivae and EOM are normal. Right eye exhibits no discharge. Left eye exhibits no discharge. Scleral icterus is present.  Scleral icterus  Neck: Normal range of motion. Neck supple.  Cardiovascular: Normal rate, regular rhythm, normal heart sounds and intact distal pulses.  Exam reveals no gallop and no friction rub.   No murmur heard. Pulmonary/Chest: Effort normal and breath sounds normal. No respiratory distress. He has no decreased breath sounds. He has no wheezes. He has no rhonchi. He has no rales.  Abdominal: Soft. Normal appearance and bowel sounds are normal. He exhibits no distension. There is tenderness in the right upper quadrant and epigastric area. There is no rigidity, no rebound, no guarding, no CVA tenderness, no tenderness at McBurney's point and negative Murphy's sign.    Soft, nondistended, +BS throughout, with mild epigastric and RUQ TTP, no r/g/r, neg murphy's, neg mcburney's, no CVA TTP   Musculoskeletal: Normal range of motion.  Neurological: He is alert and oriented to person, place, and time. He has normal strength. No sensory deficit.  Skin: Skin is warm, dry and intact. No rash noted.  Psychiatric: He has a normal mood and affect.  Nursing note and vitals reviewed.    ED Treatments / Results  Labs (all labs ordered are listed, but only abnormal results are displayed) Labs Reviewed  COMPREHENSIVE METABOLIC PANEL - Abnormal; Notable  for the following:       Result Value   Glucose, Bld 123 (*)    BUN 21 (*)    Total Bilirubin 4.8 (*)    All other components within normal limits  URINALYSIS, ROUTINE W REFLEX MICROSCOPIC (NOT AT [facility]) - Abnormal; Notable for the following:    Hgb urine dipstick TRACE (*)    All other components within normal limits  CBC WITH DIFFERENTIAL/PLATELET - Abnormal; Notable for the following:    WBC 22.5 (*)  RBC 2.48 (*)    Hemoglobin 7.6 (*)    HCT 22.4 (*)    RDW 19.3 (*)    Platelets 433 (*)    Neutro Abs 20.0 (*)    Monocytes Absolute 1.6 (*)    All other components within normal limits  RETICULOCYTES - Abnormal; Notable for the following:    Retic Ct Pct 5.3 (*)    RBC. 2.51 (*)    All other components within normal limits  URINE MICROSCOPIC-ADD ON - Abnormal; Notable for the following:    Squamous Epithelial / LPF 0-5 (*)    Bacteria, UA RARE (*)    All other components within normal limits  LIPASE, BLOOD  I-STAT TROPOININ, ED    EKG  EKG Interpretation  Date/Time:  Tuesday December 15 2015 11:44:31 EST Ventricular Rate:  73 PR Interval:    QRS Duration: 122 QT Interval:  483 QTC Calculation: 533 R Axis:   -95 Text Interpretation:  Sinus rhythm Prolonged PR interval Probable left atrial enlargement Nonspecific IVCD with LAD Inferior infarct, old Abnormal lateral Q waves Abnrm T, probable ischemia, anterolateral lds When compared to prior ECG on 11/27/15, No significant change was found Abnormal ECG Confirmed by Sherry Ruffing MD, CHRISTOPHER [phone]) on 12/15/2015 12:00:03 PM       Radiology Ct Abdomen Pelvis W Contrast  Result Date: 12/15/2015 CLINICAL DATA:  Upper abdominal pain, nausea, vomiting since 7 a.m. EXAM: CT ABDOMEN AND PELVIS WITH CONTRAST TECHNIQUE: Multidetector CT imaging of the abdomen and pelvis was performed using the standard protocol following bolus administration of intravenous contrast. CONTRAST:  129mL ISOVUE-300 IOPAMIDOL (ISOVUE-300) INJECTION  61% COMPARISON:  06/03/2015 FINDINGS: Lower chest: Cardiomegaly.  Mild lingular and bibasilar atelectasis. Hepatobiliary: No focal liver abnormality is seen. Status post cholecystectomy. No biliary dilatation. Pancreas: Unremarkable. No pancreatic ductal dilatation or surrounding inflammatory changes. Spleen: Extensive chronic splenic infarction with calcification. Adrenals/Urinary Tract: Adrenal glands are unremarkable. Kidneys are normal, without renal calculi, focal lesion, or hydronephrosis. Bladder is unremarkable. Stomach/Bowel: Bowel wall thickening of the ascending and transverse colon concerning for colitis. No pneumatosis, pneumoperitoneum or portal venous gas. No bowel obstruction. Vascular/Lymphatic: Normal caliber abdominal aorta with atherosclerosis. No lymphadenopathy. Reproductive: Prostate is unremarkable. Other: No fluid collection or hematoma.  No abdominal wall hernia. Musculoskeletal: No acute osseous abnormality. No aggressive is lytic or sclerotic osseous lesion. Mild osteosclerosis consistent with sickle cell anemia. Right total hip arthroplasty without failure complication. Mild subchondral sclerosis in the left femoral head concerning for avascular necrosis. IMPRESSION: 1. Bowel wall thickening of the ascending and transverse colon concerning for colitis which may be secondary to an infectious, inflammatory or ischemic etiology given the patient's history of sickle cell anemia. 2. Mild subchondral sclerosis in the left femoral head concerning for avascular necrosis. 3. Stable cardiomegaly. Electronically Signed   By: Kathreen Devoid   On: 12/15/2015 12:57    Procedures Procedures (including critical care time)  Medications Ordered in ED Medications  dextrose 5 %-0.45 % sodium chloride infusion ( Intravenous New Bag/Given 12/15/15 1153)  ondansetron (ZOFRAN) injection 4 mg (4 mg Intravenous Given 12/15/15 1152)  ciprofloxacin (CIPRO) IVPB 400 mg (not administered)  metroNIDAZOLE  (FLAGYL) IVPB 500 mg (not administered)  HYDROmorphone (DILAUDID) injection 2 mg (2 mg Intravenous Given 12/15/15 1152)    Or  HYDROmorphone (DILAUDID) injection 2 mg ( Subcutaneous See Alternative 12/15/15 1152)  HYDROmorphone (DILAUDID) injection 2 mg (2 mg Intravenous Given 12/15/15 1258)    Or  HYDROmorphone (DILAUDID) injection 2 mg ( Subcutaneous  See Alternative 12/15/15 1258)  iopamidol (ISOVUE-300) 61 % injection 15 mL (15 mLs Oral Contrast Given 12/15/15 1153)  iopamidol (ISOVUE-300) 61 % injection 100 mL (100 mLs Intravenous Contrast Given 12/15/15 1241)  HYDROmorphone (DILAUDID) injection 1 mg (1 mg Intravenous Given 12/15/15 1410)  diphenhydrAMINE (BENADRYL) injection 25 mg (25 mg Intravenous Given 12/15/15 1410)     Initial Impression / Assessment and Plan / ED Course  I have reviewed the triage vital signs and the nursing notes.  Pertinent labs & imaging results that were available during my care of the patient were reviewed by me and considered in my medical decision making (see chart for details).  Clinical Course     35 y.o. male here with abd pain and n/v x4hrs; states this isn't his usual sickle cell pain but he's had abd pain with crises before. Was just discharged from the hospital yesterday for pain crisis. On exam, tenderness mostly in the epigastric/RUQ area, neg murphy's, nonperitoneal. Scleral icterus. Will get labs, retics, U/A, trop/EKG, and CT abd/pelv, give fluids/zofran/dilaudid, and reassess shortly.   2:09 PM CBC w/diff with marked leukocytosis compared to several days ago, now [id number] with neutrophilic predominance; stable anemia seen as well. CMP with elevated bili 4.8 at baseline. Lipase WNL. U/A unremarkable. Trop neg. EKG unchanged from prior. Retics elevated at baseline. CT showing area of bowel wall thickening in ascending and transverse colon concerning for colitis; obviously given pt's history of having had blood clots in the past, ischemic colitis is  on the differential, but pain is not out of proportion to exam and overall pt is well appearing; inflammatory vs infectious still more likely; CTA could be beneficial but given that we just gave him contrast dye, may want to wait on this study, especially since pt has fewer clinical findings concerning for ischemic etiology. Will admit for pain control, and ongoing eval of colitis. Pain slightly better but not greatly improved after 2 rounds of dilaudid, will give another dose, in addition to benadryl at pt's request. Nausea improved at this time. Will admit. Discussed case with my attending Dr. Sherry Ruffing who agrees with plan.    2:37 PM Dr. Verlon Au of [facility] returning page, would like [place] to start empiric abx, will start cipro/flagyl; wants to discuss case with Dr. Zigmund Daniel and then will decide on which service is admitting patient, but acknowledges that one of them will admit the pt. Asked that I not place Holding orders yet, he will place them once they discuss case. Please see their notes for further documentation of care. I appreciate their help with this pleasant pt's care. Pt stable at time of admission.   Final Clinical Impressions(s) / ED Diagnoses   Final diagnoses:  Colitis  Nausea and vomiting in adult patient  Upper abdominal pain  Neutrophilic leukocytosis  Hyperbilirubinemia  Chronic anemia    New Prescriptions New Prescriptions   No medications on file     Zacarias Pontes, PA-C 12/15/15 [place], MD 12/16/15 2047

## 2015-12-15 NOTE — H&P (Signed)
Hospital Admission Note Date: 12/15/2015  Patient name: [name] Medical record number: [id number] Date of birth: Jul 31, 1980 Age: 35 y.o. Gender: male PCP: [name], MD  Attending physician: [name], MD  Chief Complaint: Abdominal Pain x 4 hours.   History of Present Illness: This is a patient with Hb SS who presents with c/o abdominal pain which began this morning. Pt describes pain as dull and occasionally sharp and non-radiating. He reports that he took his pain medications and had no relief. He reports that he had a BM last night which was formed and denies any diarrhea. He has not had any emesis but has not ate anything in particular today. He states that the pain is unlike that of the usual crisis and also states that he has been compliant with taking Xarelto. A CT of the abdomen was performed in the ED and showed an area of bowel thickening in the region of the transverse and ascending colon consistent with possible colitis.    Pt denies any fever, chills, Vomiting or diarrhea. In the ED he received 3 doses of Dilaudid in the ED and a dose of ciprofloxacin and Flagyl. Of note, patient was scheduled for RBC pheresis at [facility] yesterday as reported during his last admission and did not keep the appointment stating that he forgot.   Scheduled Meds: . ambrisentan  5 mg Oral QHS  . aspirin  81 mg Oral Daily  . cholecalciferol  2,000 Units Oral Daily  . ciprofloxacin  400 mg Intravenous Q12H  . folic acid  1 mg Oral Daily  . gabapentin  300 mg Oral TID  . hydroxyurea  1,000 mg Oral Daily  . ketorolac  30 mg Intravenous Q6H  . lisinopril  5 mg Oral Daily  . metronidazole  500 mg Intravenous Q8H  . morphine  30 mg Oral Q12H  . rivaroxaban  20 mg Oral QHS   Continuous Infusions: . dextrose 5 % and 0.45% NaCl 125 mL/hr at 12/15/15 1153   PRN Meds:.torsemide, zolpidem Allergies: Patient has no known allergies. Past Medical History:  Diagnosis Date  . Acute  chest syndrome ([place]) 06/18/2013  . Acute embolism and thrombosis of right internal jugular vein ([place])   . Alcohol consumption of one to four drinks per day   . Avascular necrosis (HCC)    Right Hip  . Blood transfusion   . Chronic anticoagulation   . Demand ischemia ([place]) 01/02/2014  . Former smoker   . Functional asplenia   . Hb-SS disease with crisis ([place])   . History of Clostridium difficile infection   . History of pulmonary embolus (PE)   . Hypertension   . Hypokalemia   . Leukocytosis    Chronic  . Mood disorder ([place])   . Noncompliance with medication regimen   . Oxygen deficiency   . Pulmonary hypertension   . Second hand tobacco smoke exposure   . Sickle cell anemia (HCC)   . Sickle-cell crisis with associated acute chest syndrome ([place]) 05/13/2013  . Stroke ([place])   . Thrombocytosis (HCC)    Chronic  . Uses marijuana    Past Surgical History:  Procedure Laterality Date  . CHOLECYSTECTOMY     01/2008  . Excision of left periauricular cyst     10/2009  . Excision of right ear lobe cyst with primary closur     11/2007  . Porta cath placement    . Porta cath removal    . PORTACATH PLACEMENT  01/05/2012   Procedure: INSERTION PORT-A-CATH;  Surgeon: Odis Hollingshead, MD;  Location: [place];  Service: General;  Laterality: N/A;  ultrasound guiced port a cath insertion with fluoroscopy  . Right hip replacement     08/2006  . UMBILICAL HERNIA REPAIR     01/2008   Family History  Problem Relation Age of Onset  . Sickle cell trait Mother   . Depression Mother   . Diabetes Mother   . Sickle cell trait Father   . Sickle cell trait Brother    Social History   Social History  . Marital status: Single    Spouse name: N/A  . Number of children: 0  . Years of education: 39   Occupational History  . 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 Smoking status: Former Smoker    Packs/day: 0.50    Years: 10.00     Types: Cigarettes    Quit date: 05/29/2011  . Smokeless tobacco: Never Used  . Alcohol use No  . Drug use: No     Comment: Marijuana weekly  . Sexual activity: Yes    Partners: Female     Comment: month ago   Other Topics Concern  . Not on file   Social History Narrative   Lives in an apartment.  Single.  Lives alone but has a girlfriend that helps care for him.  Does not use any assist devices.        [name]  [phone] Mom, emergency contact      [facility]:   Patient continuing to live in her apartment in town alone. 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   Review of Systems: Pertinent items noted in HPI and remainder of comprehensive ROS otherwise negative. Physical Exam: No intake or output data in the 24 hours ending 12/15/15 1623 General: Alert, awake, oriented x3, in no acute distress.  HEENT: [place]/AT PEERL, EOMI, anicteric Neck: Trachea midline,  no masses, no thyromegal,y no JVD, no carotid bruit OROPHARYNX:  Moist, No exudate/ erythema/lesions.  Heart: Regular rate and rhythm, without murmurs, rubs, gallops, PMI non-displaced, no heaves or thrills on palpation.  Lungs: Clear to auscultation, no wheezing or rhonchi noted. No increased vocal fremitus resonant to percussion  Abdomen: Soft,left periumbilical tenderness, nondistended, decreased bowel sounds, no masses no hepatosplenomegaly noted..  Neuro: No focal neurological deficits noted cranial nerves II through XII grossly intact.  Strength at functional baseline in bilateral upper and lower extremities. Musculoskeletal: No warmth swelling or erythema around joints, no spinal tenderness noted. Psychiatric: Patient alert and oriented x3, good insight and cognition, good recent to remote recall.   Lab results:  Recent Labs  12/15/15 1136  NA 138  K 3.7  CL 110  CO2 22  GLUCOSE 123*  BUN 21*  CREATININE 0.95  CALCIUM 8.9    Recent Labs  12/15/15 1136  AST 25  ALT 18  ALKPHOS 79  BILITOT  4.8*  PROT 7.2  ALBUMIN 4.5    Recent Labs  12/15/15 1136  LIPASE 43    Recent Labs  12/15/15 1137  WBC 22.5*  NEUTROABS 20.0*  HGB 7.6*  HCT 22.4*  MCV 90.3  PLT 433*   No results for input(s): CKTOTAL, CKMB, CKMBINDEX, TROPONINI in the last 72 hours. Invalid input(s): POCBNP No results for input(s): DDIMER in the last 72 hours. No results for input(s): HGBA1C in the last  72 hours. No results for input(s): CHOL, HDL, LDLCALC, TRIG, CHOLHDL, LDLDIRECT in the last 72 hours. No results for input(s): TSH, T4TOTAL, T3FREE, THYROIDAB in the last 72 hours.  Invalid input(s): FREET3  Recent Labs  12/15/15 1137  RETICCTPCT 5.3*   Imaging results:  Dg Chest 2 View  Result Date: 11/27/2015 CLINICAL DATA:  Sickle cell disease.  Chest pain for 2-3 days. EXAM: CHEST  2 VIEW COMPARISON:  Chest radiograph 11/23/2015 FINDINGS: There is a left chest wall Port-A-Cath with the tip in the proximal right atrium, unchanged. Unchanged cardiomegaly. No pulmonary edema. No focal airspace consolidation. No pleural effusion or pneumothorax. Unchanged hazy opacities and nodular density of the right mid lung. IMPRESSION: Unchanged cardiomegaly without overt pulmonary edema. Unchanged hazy opacities in the right mid lung. Electronically Signed   By: Ulyses Jarred M.D.   On: 11/27/2015 23:05   Dg Chest 2 View  Result Date: 11/23/2015 CLINICAL DATA:  Bilateral side pain for 1 week with hypoxia. EXAM: CHEST  2 VIEW COMPARISON:  11/22/2015 FINDINGS: The lungs are clear wiithout focal pneumonia, edema, pneumothorax or pleural effusion. The cardio pericardial silhouette is enlarged. There is pulmonary vascular congestion without overt pulmonary edema. Nodular areas in the peripheral aspect of the right mid lung were seen on CT scan from 2 weeks ago. Left Port-A-Cath tip projects at the SVC/RA junction. Telemetry leads overlie the chest. IMPRESSION: Cardiomegaly with pulmonary vascular congestion. Nodular  density right mid lung better seen on recent CT scan. Electronically Signed   By: Misty Stanley M.D.   On: 11/23/2015 10:34   Ct Abdomen Pelvis W Contrast  Result Date: 12/15/2015 CLINICAL DATA:  Upper abdominal pain, nausea, vomiting since 7 a.m. EXAM: CT ABDOMEN AND PELVIS WITH CONTRAST TECHNIQUE: Multidetector CT imaging of the abdomen and pelvis was performed using the standard protocol following bolus administration of intravenous contrast. CONTRAST:  143mL ISOVUE-300 IOPAMIDOL (ISOVUE-300) INJECTION 61% COMPARISON:  06/03/2015 FINDINGS: Lower chest: Cardiomegaly.  Mild lingular and bibasilar atelectasis. Hepatobiliary: No focal liver abnormality is seen. Status post cholecystectomy. No biliary dilatation. Pancreas: Unremarkable. No pancreatic ductal dilatation or surrounding inflammatory changes. Spleen: Extensive chronic splenic infarction with calcification. Adrenals/Urinary Tract: Adrenal glands are unremarkable. Kidneys are normal, without renal calculi, focal lesion, or hydronephrosis. Bladder is unremarkable. Stomach/Bowel: Bowel wall thickening of the ascending and transverse colon concerning for colitis. No pneumatosis, pneumoperitoneum or portal venous gas. No bowel obstruction. Vascular/Lymphatic: Normal caliber abdominal aorta with atherosclerosis. No lymphadenopathy. Reproductive: Prostate is unremarkable. Other: No fluid collection or hematoma.  No abdominal wall hernia. Musculoskeletal: No acute osseous abnormality. No aggressive is lytic or sclerotic osseous lesion. Mild osteosclerosis consistent with sickle cell anemia. Right total hip arthroplasty without failure complication. Mild subchondral sclerosis in the left femoral head concerning for avascular necrosis. IMPRESSION: 1. Bowel wall thickening of the ascending and transverse colon concerning for colitis which may be secondary to an infectious, inflammatory or ischemic etiology given the patient's history of sickle cell anemia. 2.  Mild subchondral sclerosis in the left femoral head concerning for avascular necrosis. 3. Stable cardiomegaly. Electronically Signed   By: Kathreen Devoid   On: 12/15/2015 12:57   Dg Chest Port 1 View  Result Date: 11/22/2015 CLINICAL DATA:  Sickle-cell crisis, acute chest syndrome EXAM: PORTABLE CHEST 1 VIEW COMPARISON:  11/10/2015 FINDINGS: Marked cardiomegaly with mild vascular congestion and diffuse interstitial prominence. Basilar peripheral Kerley B-lines noted suspicious for mild basilar interstitial edema pattern. There is associated increased bibasilar atelectasis. No large effusion or  pneumothorax. Trachea is midline. Left subclavian power port catheter tip at the SVC RA junction. IMPRESSION: Marked cardiomegaly with mild basilar interstitial edema pattern and increased basilar atelectasis. Electronically Signed   By: Jerilynn Mages.  Shick M.D.   On: 11/22/2015 08:40     Assessment and Plan: 1. Abdominal pain: The tenderness is not in the area of reported abnormality on the CT. I suspect that the patient has low level chronic ischemia associated with sickle cell disease. However I do believe that this presentation represents a variation of Sickle Cell Crisis. Will treat as crisis with weight based PCA, Toradol and IVF.  2. Colitis: Pt likely has chronic ischemic changes to the bowel. Will continue  flagyl and ciprofloxacin for radiologic findings of colitis. Doubt infectious etiology as no diarrhea and last stool was formed. Will also check lactic acid.  3. Chronic pain Continue MS Contin.  4. S/P CVA: Pt reports that he was scheduled to have RBC pheresis yesterday but missed the appointment as he forgot.  5. Leukocytosis: Likely most related to crisis may have a component related to colitis. Will re-assess after hydration. 6. Anemia of Chronic Disease: Pt was scheduled for RBC pheresis yesterday but missed his appointment. Hb currently at baseline. Will re-assess tomorrow.   Virgilio Broadhead  A. 12/15/2015, 4:23 PM

## 2015-12-15 NOTE — ED Notes (Signed)
Bed: WA21 Expected date:  Expected time:  Means of arrival:  Comments: 

## 2015-12-15 NOTE — ED Notes (Signed)
Urinal at bedside.  

## 2015-12-15 NOTE — Progress Notes (Signed)
Confirmed with pt that his pcp remains [name] Pt covered by medicare and medicaid Pt with ED visits x 11 and admissions x 24 Pt confirms no home health services at this time Pt confirms no issues with getting and taking his medications Pt without ED CP and pt with available [facility] services but is seen by sickle cell clinic on yesterday 12/14/15

## 2015-12-16 ENCOUNTER — Inpatient Hospital Stay (HOSPITAL_COMMUNITY): Payer: Medicare Other

## 2015-12-16 DIAGNOSIS — D72829 Elevated white blood cell count, unspecified: Secondary | ICD-10-CM

## 2015-12-16 DIAGNOSIS — R112 Nausea with vomiting, unspecified: Secondary | ICD-10-CM | POA: Diagnosis not present

## 2015-12-16 DIAGNOSIS — D638 Anemia in other chronic diseases classified elsewhere: Secondary | ICD-10-CM

## 2015-12-16 DIAGNOSIS — D5700 Hb-SS disease with crisis, unspecified: Secondary | ICD-10-CM | POA: Diagnosis not present

## 2015-12-16 DIAGNOSIS — R1084 Generalized abdominal pain: Secondary | ICD-10-CM | POA: Diagnosis not present

## 2015-12-16 DIAGNOSIS — K55039 Acute (reversible) ischemia of large intestine, extent unspecified: Secondary | ICD-10-CM | POA: Diagnosis not present

## 2015-12-16 DIAGNOSIS — R109 Unspecified abdominal pain: Secondary | ICD-10-CM | POA: Diagnosis not present

## 2015-12-16 DIAGNOSIS — K529 Noninfective gastroenteritis and colitis, unspecified: Secondary | ICD-10-CM | POA: Diagnosis not present

## 2015-12-16 LAB — CBC WITH DIFFERENTIAL/PLATELET
BASOS PCT: 0 %
Basophils Absolute: 0 10*3/uL (ref 0.0–0.1)
EOS ABS: 0.5 10*3/uL (ref 0.0–0.7)
EOS PCT: 2 %
HCT: 20.9 % — ABNORMAL LOW (ref 39.0–52.0)
Hemoglobin: 7.1 g/dL — ABNORMAL LOW (ref 13.0–17.0)
LYMPHS ABS: 2.4 10*3/uL (ref 0.7–4.0)
Lymphocytes Relative: 9 %
MCH: 30.9 pg (ref 26.0–34.0)
MCHC: 34 g/dL (ref 30.0–36.0)
MCV: 90.9 fL (ref 78.0–100.0)
MONO ABS: 2.9 10*3/uL — AB (ref 0.1–1.0)
Monocytes Relative: 11 %
NEUTROS PCT: 78 %
Neutro Abs: 20.8 10*3/uL — ABNORMAL HIGH (ref 1.7–7.7)
PLATELETS: 415 10*3/uL — AB (ref 150–400)
RBC: 2.3 MIL/uL — ABNORMAL LOW (ref 4.22–5.81)
RDW: 19.3 % — AB (ref 11.5–15.5)
WBC: 26.6 10*3/uL — ABNORMAL HIGH (ref 4.0–10.5)

## 2015-12-16 LAB — LACTIC ACID, PLASMA: LACTIC ACID, VENOUS: 0.8 mmol/L (ref 0.5–1.9)

## 2015-12-16 LAB — HEPARIN LEVEL (UNFRACTIONATED): Heparin Unfractionated: 0.45 IU/mL (ref 0.30–0.70)

## 2015-12-16 LAB — RETICULOCYTES
RBC.: 2.3 MIL/uL — ABNORMAL LOW (ref 4.22–5.81)
RETIC CT PCT: 4.6 % — AB (ref 0.4–3.1)
Retic Count, Absolute: 105.8 10*3/uL (ref 19.0–186.0)

## 2015-12-16 LAB — APTT: aPTT: 44 seconds — ABNORMAL HIGH (ref 24–36)

## 2015-12-16 LAB — PROTIME-INR
INR: 1.53
Prothrombin Time: 18.5 seconds — ABNORMAL HIGH (ref 11.4–15.2)

## 2015-12-16 MED ORDER — VANCOMYCIN HCL IN DEXTROSE 1-5 GM/200ML-% IV SOLN
1000.0000 mg | Freq: Three times a day (TID) | INTRAVENOUS | Status: DC
  Administered 2015-12-17 – 2015-12-18 (×5): 1000 mg via INTRAVENOUS
  Filled 2015-12-16 (×5): qty 200

## 2015-12-16 MED ORDER — HEPARIN BOLUS VIA INFUSION
2000.0000 [IU] | Freq: Once | INTRAVENOUS | Status: AC
  Administered 2015-12-16: 2000 [IU] via INTRAVENOUS
  Filled 2015-12-16: qty 2000

## 2015-12-16 MED ORDER — VANCOMYCIN HCL 10 G IV SOLR
1500.0000 mg | Freq: Once | INTRAVENOUS | Status: AC
  Administered 2015-12-16: 1500 mg via INTRAVENOUS
  Filled 2015-12-16: qty 1500

## 2015-12-16 MED ORDER — PIPERACILLIN-TAZOBACTAM 3.375 G IVPB 30 MIN: 3.3750 g | Freq: Three times a day (TID) | INTRAVENOUS | Status: DC

## 2015-12-16 MED ORDER — HYDROMORPHONE HCL 2 MG/ML IJ SOLN
2.0000 mg | INTRAMUSCULAR | Status: DC | PRN
  Administered 2015-12-16 – 2015-12-17 (×5): 2 mg via INTRAVENOUS
  Filled 2015-12-16 (×5): qty 1

## 2015-12-16 MED ORDER — PIPERACILLIN-TAZOBACTAM 3.375 G IVPB
3.3750 g | Freq: Three times a day (TID) | INTRAVENOUS | Status: DC
  Administered 2015-12-16 – 2015-12-18 (×6): 3.375 g via INTRAVENOUS
  Filled 2015-12-16 (×6): qty 50

## 2015-12-16 MED ORDER — HEPARIN (PORCINE) IN NACL 100-0.45 UNIT/ML-% IJ SOLN
1300.0000 [IU]/h | INTRAMUSCULAR | Status: DC
  Administered 2015-12-16 – 2015-12-19 (×4): 1300 [IU]/h via INTRAVENOUS
  Filled 2015-12-16 (×6): qty 250

## 2015-12-16 NOTE — Progress Notes (Signed)
Patient ID: [name], male   DOB: [birth year], 35 y.o.   MRN: [id number] Called by nurse for patient having increased abdominal pain which is an acute change from earlier today. Pain started developing after he ate this afternoon. Pt has had no emesis and is now c/o excruciating pain which started in left peri-umbilical region and now over entire abdomen.   Focused Examination: VS: BP T:98, BP:126/87, HR:94., RR:16 and Sats:92% on 2L.  Gen: Pt in bed writhing in pain.  Lungs: CTA CVS: Tachycardia Abdomen: Distended with diffuse abdominal tenderness and guarding. Questionable rebound.  EXT: No C/C/E.   Assessment and Plan: Pt with new acute abdomen. Will obtain KUB and surgical consult. Also add Vancomycin.  [name] A.

## 2015-12-16 NOTE — Progress Notes (Addendum)
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)  No Known Allergies  Patient Measurements: Height: 6' (182.9 cm) Weight: 176 lb 12.8 oz (80.2 kg) IBW/kg (Calculated) : 77.6 Heparin Dosing Weight: used [place]  Vital Signs: Temp: 98 F (36.7 C) (11/15 1812) Temp Source: Oral (11/15 1812) BP: 117/71 (11/15 1812) Pulse Rate: 88 (11/15 1812)  Labs:  Recent Labs  12/15/15 1136 12/15/15 1137 12/16/15 1113 12/16/15 1900  HGB  --  7.6* 7.1*  --   HCT  --  22.4* 20.9*  --   PLT  --  433* 415*  --   APTT  --   --   --  44*  LABPROT  --   --   --  18.5*  INR  --   --   --  1.53  CREATININE 0.95  --   --   --     Estimated Creatinine Clearance: 118 mL/min (by C-G formula based on SCr of 0.95 mg/dL).   Medical History: Past Medical History:  Diagnosis Date  . Acute chest syndrome ([place]) 06/18/2013  . Acute embolism and thrombosis of right internal jugular vein ([place])   . Alcohol consumption of one to four drinks per day   . Avascular necrosis (HCC)    Right Hip  . Blood transfusion   . Chronic anticoagulation   . Demand ischemia ([place]) 01/02/2014  . Former smoker   . Functional asplenia   . Hb-SS disease with crisis ([place])   . History of Clostridium difficile infection   . History of pulmonary embolus (PE)   . Hypertension   . Hypokalemia   . Leukocytosis    Chronic  . Mood disorder ([place])   . Noncompliance with medication regimen   . Oxygen deficiency   . Pulmonary hypertension   . Second hand tobacco smoke exposure   . Sickle cell anemia (HCC)   . Sickle-cell crisis with associated acute chest syndrome ([place]) 05/13/2013  . Stroke ([place])   . Thrombocytosis (HCC)    Chronic  . Uses marijuana     Medications:  Prescriptions Prior to Admission  Medication Sig Dispense Refill Last Dose  . ambrisentan (LETAIRIS) 5 MG tablet Take 5 mg by mouth at bedtime.   12/14/2015 at Unknown time  . aspirin 81 MG chewable  tablet Chew 1 tablet (81 mg total) by mouth daily. 30 tablet 11 12/15/2015 at 0800  . cholecalciferol (VITAMIN D) 1000 units tablet Take 2,000 Units by mouth daily.   12/15/2015 at Unknown time  . Deferasirox (JADENU) 360 MG TABS Take 1,080 mg by mouth daily.    12/15/2015 at Unknown time  . folic acid (FOLVITE) 1 MG tablet Take 1 mg by mouth daily.   12/15/2015 at Unknown time  . gabapentin (NEURONTIN) 300 MG capsule Take 1 capsule (300 mg total) by mouth 3 (three) times daily. 270 capsule 3 12/14/2015 at Unknown time  . hydroxyurea (HYDREA) 500 MG capsule Take 1,000 mg by mouth daily. May take with food to minimize GI side effects.   12/15/2015 at Unknown time  . lisinopril (PRINIVIL,ZESTRIL) 5 MG tablet Take 1 tablet (5 mg total) by mouth daily. 30 tablet 1 12/15/2015 at Unknown time  . morphine (MS CONTIN) 30 MG 12 hr tablet Take 1 tablet (30 mg total) by mouth every 12 (twelve) hours. 60 tablet 0 12/15/2015 at Unknown time  . rivaroxaban (XARELTO) 20 MG TABS tablet Take 20 mg by mouth at bedtime.  12/14/2015 at 2100  . torsemide (DEMADEX) 20 MG tablet Take 20 mg by mouth daily as needed (for weight gain greater than 3lbs.).    Past Month at Unknown time  . zolpidem (AMBIEN) 10 MG tablet Take 1 tablet (10 mg total) by mouth at bedtime as needed for sleep. 30 tablet 0 12/14/2015 at Unknown time   Scheduled:  . ambrisentan  5 mg Oral QHS  . aspirin  81 mg Oral Daily  . cholecalciferol  2,000 Units Oral Daily  . folic acid  1 mg Oral Daily  . gabapentin  300 mg Oral TID  . heparin  2,000 Units Intravenous Once  . HYDROmorphone   Intravenous Q4H  . hydroxyurea  1,000 mg Oral Daily  . ketorolac  30 mg Intravenous Q6H  . lisinopril  5 mg Oral Daily  . morphine  30 mg Oral Q12H  . piperacillin-tazobactam (ZOSYN)  IV  3.375 g Intravenous Q8H  . vancomycin  1,500 mg Intravenous Once   Followed by  . [START ON 12/17/2015] vancomycin  1,000 mg Intravenous Q8H    Assessment: 35 y.o. male  with Hx VTE on Xarelto PTA, admitted on 12/15/2015 with possible colitis vs [place] pain, acutely worse today. Surgery consulted and starting IV heparin per pharmacy while off Xarelto.   Baseline INR and HL elevated as expected with recent Xarelto, aPTT mildly elevated but not in therapeutic range  Prior anticoagulation: Xarelto 20 mg daily; last dose 11/13 PM  Significant events:  Today, 12/16/2015:  CBC: Hgb low d/t  disease, Plt elevated  No bleeding or infusion issues per nursing  CrCl: > 90 ml/min  Goal of Therapy: Heparin level 0.3-0.7 units/ml APTT 66-102 sec Monitor platelets by anticoagulation protocol: Yes  Plan:  Heparin 2000 units IV bolus x 1   Heparin 1300 units/hr IV infusion  Check aPTT 8 hrs after start as HL falsely elevated d/t recent Xarelto  Daily heparin level; continue to check aPTT as needed until heparin level correlating with aPTT   Daily CBC  Monitor for signs of bleeding or thrombosis   Reuel Boom, PharmD Pager: [phone] 12/16/2015, 7:44 PM

## 2015-12-16 NOTE — Progress Notes (Signed)
SICKLE CELL SERVICE PROGRESS NOTE  [name] [id number] DOB: 23-Jul-1980 DOA: 12/15/2015 PCP: [name], MD  Assessment/Plan: Active Problems:   Colitis  1. Colitis: Pt continues to have abdominal pain but is tolerating diet without difficulty. Continue on Ciprofloxacin and Flagyl.  2. Hb SS with crisis Continue PCA and Toradol. Also continue IVF.  3. Leukocytosis: WBC more elevated today but no overt signs of worsening infection. Will continue to monitor  4. Anemia of Chronic Disease:Hb at baseline 5. Chronic pain: Continue MS Contin 6. Medical non-compliance: Per patient he has missed his last RBC pheresis at [facility].   Code Status: Full Code Family Communication: N/A Disposition Plan: Not yet ready for discharge  [name].  Pager [phone]. If 7PM-7AM, please contact night-coverage.  12/16/2015, 3:19 PM  LOS: 1 day    Interim History: Pt states that pain in abdomen unchanged since yesterday. He has tolerated diet well and has had no diarrhea. He has used 44.77 mg with 85/75:demands/deliveries in the last 24 hours.   Consultants:  None  Procedures:  None  Antibiotics:  Ciprofloxacin 11/14 >>  Metronidazole 11/14 >>   Objective: Vitals:   12/16/15 0705 12/16/15 0831 12/16/15 1012 12/16/15 1248  BP: 116/83  108/74   Pulse: 85  94   Resp: 18 18 (!) 21 14  Temp: 98.5 F (36.9 C)  98.9 F (37.2 C)   TempSrc: Oral  Oral   SpO2: 95% 96% 98% 94%  Weight: 80.2 kg (176 lb 12.8 oz)     Height:       Weight change:   Intake/Output Summary (Last 24 hours) at 12/16/15 1519 Last data filed at 12/16/15 1102  Gross per 24 hour  Intake             1635 ml  Output              925 ml  Net              710 ml    General: Alert, awake, oriented x3, in no acute distress.  HEENT: [place]/AT PEERL, EOMI Neck: Trachea midline,  no masses, no thyromegal,y no JVD, no carotid bruit OROPHARYNX:  Moist, No exudate/ erythema/lesions.  Heart: Regular rate  and rhythm, without murmurs, rubs, gallops, PMI non-displaced, no heaves or thrills on palpation.  Lungs: Clear to auscultation, no wheezing or rhonchi noted. No increased vocal fremitus resonant to percussion  Abdomen: Soft,mild tenderness left periumbilical area, nondistended, positive bowel sounds, no masses no hepatosplenomegaly noted.  Neuro: No focal neurological deficits noted cranial nerves II through XII grossly intact.  Strength at functional baseline in bilateral upper and lower extremities. Musculoskeletal: No warmth swelling or erythema around joints, no spinal tenderness noted. Psychiatric: Patient alert and oriented x3, good insight and cognition, good recent to remote recall. Lymph node survey: No cervical axillary or inguinal lymphadenopathy noted.   Data Reviewed: Basic Metabolic Panel:  Recent Labs Lab 12/12/15 1445 12/15/15 1136  NA 136 138  K 3.3* 3.7  CL 106 110  CO2 24 22  GLUCOSE 93 123*  BUN 8 21*  CREATININE 0.66 0.95  CALCIUM 9.2 8.9   Liver Function Tests:  Recent Labs Lab 12/12/15 1445 12/15/15 1136  AST 28 25  ALT 20 18  ALKPHOS 80 79  BILITOT 3.5* 4.8*  PROT 7.7 7.2  ALBUMIN 4.5 4.5    Recent Labs Lab 12/15/15 1136  LIPASE 43   No results for input(s): AMMONIA in the last 168 hours.  CBC:  Recent Labs Lab 12/12/15 1445 12/15/15 1137 12/16/15 1113  WBC 11.5* 22.5* 26.6*  NEUTROABS 7.9* 20.0* 20.8*  HGB 8.0* 7.6* 7.1*  HCT 23.9* 22.4* 20.9*  MCV 89.5 90.3 90.9  PLT 373 433* 415*   Cardiac Enzymes: No results for input(s): CKTOTAL, CKMB, CKMBINDEX, TROPONINI in the last 168 hours. BNP (last 3 results)  Recent Labs  08/02/15 2307 11/28/15 0520  BNP 503.2* 422.5*    ProBNP (last 3 results) No results for input(s): PROBNP in the last 8760 hours.  CBG: No results for input(s): GLUCAP in the last 168 hours.  No results found for this or any previous visit (from the past 240 hour(s)).   Studies: Dg Chest 2  View  Result Date: 11/27/2015 CLINICAL DATA:  Sickle cell disease.  Chest pain for 2-3 days. EXAM: CHEST  2 VIEW COMPARISON:  Chest radiograph 11/23/2015 FINDINGS: There is a left chest wall Port-A-Cath with the tip in the proximal right atrium, unchanged. Unchanged cardiomegaly. No pulmonary edema. No focal airspace consolidation. No pleural effusion or pneumothorax. Unchanged hazy opacities and nodular density of the right mid lung. IMPRESSION: Unchanged cardiomegaly without overt pulmonary edema. Unchanged hazy opacities in the right mid lung. Electronically Signed   By: Ulyses Jarred M.D.   On: 11/27/2015 23:05   Dg Chest 2 View  Result Date: 11/23/2015 CLINICAL DATA:  Bilateral side pain for 1 week with hypoxia. EXAM: CHEST  2 VIEW COMPARISON:  11/22/2015 FINDINGS: The lungs are clear wiithout focal pneumonia, edema, pneumothorax or pleural effusion. The cardio pericardial silhouette is enlarged. There is pulmonary vascular congestion without overt pulmonary edema. Nodular areas in the peripheral aspect of the right mid lung were seen on CT scan from 2 weeks ago. Left Port-A-Cath tip projects at the SVC/RA junction. Telemetry leads overlie the chest. IMPRESSION: Cardiomegaly with pulmonary vascular congestion. Nodular density right mid lung better seen on recent CT scan. Electronically Signed   By: Misty Stanley M.D.   On: 11/23/2015 10:34   Ct Abdomen Pelvis W Contrast  Result Date: 12/15/2015 CLINICAL DATA:  Upper abdominal pain, nausea, vomiting since 7 a.m. EXAM: CT ABDOMEN AND PELVIS WITH CONTRAST TECHNIQUE: Multidetector CT imaging of the abdomen and pelvis was performed using the standard protocol following bolus administration of intravenous contrast. CONTRAST:  137mL ISOVUE-300 IOPAMIDOL (ISOVUE-300) INJECTION 61% COMPARISON:  06/03/2015 FINDINGS: Lower chest: Cardiomegaly.  Mild lingular and bibasilar atelectasis. Hepatobiliary: No focal liver abnormality is seen. Status post  cholecystectomy. No biliary dilatation. Pancreas: Unremarkable. No pancreatic ductal dilatation or surrounding inflammatory changes. Spleen: Extensive chronic splenic infarction with calcification. Adrenals/Urinary Tract: Adrenal glands are unremarkable. Kidneys are normal, without renal calculi, focal lesion, or hydronephrosis. Bladder is unremarkable. Stomach/Bowel: Bowel wall thickening of the ascending and transverse colon concerning for colitis. No pneumatosis, pneumoperitoneum or portal venous gas. No bowel obstruction. Vascular/Lymphatic: Normal caliber abdominal aorta with atherosclerosis. No lymphadenopathy. Reproductive: Prostate is unremarkable. Other: No fluid collection or hematoma.  No abdominal wall hernia. Musculoskeletal: No acute osseous abnormality. No aggressive is lytic or sclerotic osseous lesion. Mild osteosclerosis consistent with sickle cell anemia. Right total hip arthroplasty without failure complication. Mild subchondral sclerosis in the left femoral head concerning for avascular necrosis. IMPRESSION: 1. Bowel wall thickening of the ascending and transverse colon concerning for colitis which may be secondary to an infectious, inflammatory or ischemic etiology given the patient's history of sickle cell anemia. 2. Mild subchondral sclerosis in the left femoral head concerning for avascular necrosis. 3. Stable cardiomegaly.  Electronically Signed   By: Kathreen Devoid   On: 12/15/2015 12:57   Dg Chest Port 1 View  Result Date: 11/22/2015 CLINICAL DATA:  Sickle-cell crisis, acute chest syndrome EXAM: PORTABLE CHEST 1 VIEW COMPARISON:  11/10/2015 FINDINGS: Marked cardiomegaly with mild vascular congestion and diffuse interstitial prominence. Basilar peripheral Kerley B-lines noted suspicious for mild basilar interstitial edema pattern. There is associated increased bibasilar atelectasis. No large effusion or pneumothorax. Trachea is midline. Left subclavian power port catheter tip at the SVC  RA junction. IMPRESSION: Marked cardiomegaly with mild basilar interstitial edema pattern and increased basilar atelectasis. Electronically Signed   By: Jerilynn Mages.  Shick M.D.   On: 11/22/2015 08:40    Scheduled Meds: . ambrisentan  5 mg Oral QHS  . aspirin  81 mg Oral Daily  . cholecalciferol  2,000 Units Oral Daily  . ciprofloxacin  400 mg Intravenous Q12H  . folic acid  1 mg Oral Daily  . gabapentin  300 mg Oral TID  . HYDROmorphone   Intravenous Q4H  . hydroxyurea  1,000 mg Oral Daily  . ketorolac  30 mg Intravenous Q6H  . lisinopril  5 mg Oral Daily  . metronidazole  500 mg Intravenous Q8H  . morphine  30 mg Oral Q12H  . rivaroxaban  20 mg Oral QHS   Continuous Infusions: . dextrose 5 % and 0.45% NaCl 125 mL/hr at 12/16/15 0553    Active Problems:   Colitis  In excess of 25 minutes spent during this visit. Greater than 50% involved face to face contact with the patient for assessment, counseling and coordination of care.

## 2015-12-16 NOTE — Consult Note (Signed)
Reason for Consult:Worsening abdominal pain Referring Physician: Dr Eulah Citizen is an 35 y.o. male.  HPI: Pt admitted yesterday with abdominal pain and nausea.  CT scan showed some right sided colitis.  He was placed on IV antibiotics.  This am his wbc was up but his exam was stable per primary MD.  This afternoon, he developed worsening LLQ pain that was felt to be an acute abdomen.  I was called for an urgent consult.    Past Medical History:  Diagnosis Date  . Acute chest syndrome ([place]) 06/18/2013  . Acute embolism and thrombosis of right internal jugular vein ([place])   . Alcohol consumption of one to four drinks per day   . Avascular necrosis (H[facility])    Right Hip  . Blood transfusion   . Chronic anticoagulation   . Demand ischemia ([place]) 01/02/2014  . Former smoker   . Functional asplenia   . Hb-SS disease with crisis ([place])   . History of Clostridium difficile infection   . History of pulmonary embolus (PE)   . Hypertension   . Hypokalemia   . Leukocytosis    Chronic  . Mood disorder ([place])   . Noncompliance with medication regimen   . Oxygen deficiency   . Pulmonary hypertension   . Second hand tobacco smoke exposure   . Sickle cell anemia (HCC)   . Sickle-cell crisis with associated acute chest syndrome ([place]) 05/13/2013  . Stroke ([place])   . Thrombocytosis (HCC)    Chronic  . Uses marijuana     Past Surgical History:  Procedure Laterality Date  . CHOLECYSTECTOMY     01/2008  . Excision of left periauricular cyst     10/2009  . Excision of right ear lobe cyst with primary closur     11/2007  . Porta cath placement    . Porta cath removal    . PORTACATH PLACEMENT  01/05/2012   Procedure: INSERTION PORT-A-CATH;  Surgeon: Odis Hollingshead, MD;  Location: [facility];  Service: General;  Laterality: N/A;  ultrasound guiced port a cath insertion with fluoroscopy  . Right hip replacement     08/2006  . UMBILICAL HERNIA REPAIR     01/2008    Family History   Problem Relation Age of Onset  . Sickle cell trait Mother   . Depression Mother   . Diabetes Mother   . Sickle cell trait Father   . Sickle cell trait Brother     Social History:  reports that he quit smoking about 4 years ago. His smoking use included Cigarettes. He has a 5.00 pack-year smoking history. He has never used smokeless tobacco. He reports that he does not drink alcohol or use drugs.  Allergies: No Known Allergies  Medications: I have reviewed the patient's current medications.  Results for orders placed or performed during the hospital encounter of 12/15/15 (from the past 48 hour(s))  Lipase, blood     Status: None   Collection Time: 12/15/15 11:36 AM  Result Value Ref Range   Lipase 43 11 - 51 U/L  Comprehensive metabolic panel     Status: Abnormal   Collection Time: 12/15/15 11:36 AM  Result Value Ref Range   Sodium 138 135 - 145 mmol/L   Potassium 3.7 3.5 - 5.1 mmol/L   Chloride 110 101 - 111 mmol/L   CO2 22 22 - 32 mmol/L   Glucose, Bld 123 (H) 65 - 99 mg/dL   BUN 21 (H) 6 -  20 mg/dL   Creatinine, Ser 0.95 0.61 - 1.24 mg/dL   Calcium 8.9 8.9 - 10.3 mg/dL   Total Protein 7.2 6.5 - 8.1 g/dL   Albumin 4.5 3.5 - 5.0 g/dL   AST 25 15 - 41 U/L   ALT 18 17 - 63 U/L   Alkaline Phosphatase 79 38 - 126 U/L   Total Bilirubin 4.8 (H) 0.3 - 1.2 mg/dL   GFR calc non Af Amer >60 >60 mL/min   GFR calc Af Amer >60 >60 mL/min    Comment: (NOTE) The eGFR has been calculated using the CKD EPI equation. This calculation has not been validated in all clinical situations. eGFR's persistently <60 mL/min signify possible Chronic Kidney Disease.    Anion gap 6 5 - 15  CBC with Differential     Status: Abnormal   Collection Time: 12/15/15 11:37 AM  Result Value Ref Range   WBC 22.5 (H) 4.0 - 10.5 K/uL   RBC 2.48 (L) 4.22 - 5.81 MIL/uL   Hemoglobin 7.6 (L) 13.0 - 17.0 g/dL   HCT 22.4 (L) 39.0 - 52.0 %   MCV 90.3 78.0 - 100.0 fL   MCH 30.6 26.0 - 34.0 pg   MCHC 33.9 30.0 -  36.0 g/dL   RDW 19.3 (H) 11.5 - 15.5 %   Platelets 433 (H) 150 - 400 K/uL   Neutrophils Relative % 89 %   Neutro Abs 20.0 (H) 1.7 - 7.7 K/uL   Lymphocytes Relative 4 %   Lymphs Abs 0.9 0.7 - 4.0 K/uL   Monocytes Relative 7 %   Monocytes Absolute 1.6 (H) 0.1 - 1.0 K/uL   Eosinophils Relative 0 %   Eosinophils Absolute 0.0 0.0 - 0.7 K/uL   Basophils Relative 0 %   Basophils Absolute 0.1 0.0 - 0.1 K/uL  Reticulocytes     Status: Abnormal   Collection Time: 12/15/15 11:37 AM  Result Value Ref Range   Retic Ct Pct 5.3 (H) 0.4 - 3.1 %   RBC. 2.51 (L) 4.22 - 5.81 MIL/uL   Retic Count, Manual 133.0 19.0 - 186.0 K/uL  I-stat troponin, ED     Status: None   Collection Time: 12/15/15 11:53 AM  Result Value Ref Range   Troponin i, poc 0.00 0.00 - 0.08 ng/mL   Comment 3            Comment: Due to the release kinetics of cTnI, a negative result within the first hours of the onset of symptoms does not rule out myocardial infarction with certainty. If myocardial infarction is still suspected, repeat the test at appropriate intervals.   Urinalysis, Routine w reflex microscopic     Status: Abnormal   Collection Time: 12/15/15 12:45 PM  Result Value Ref Range   Color, Urine YELLOW YELLOW   APPearance CLEAR CLEAR   Specific Gravity, Urine 1.013 1.005 - 1.030   pH 6.5 5.0 - 8.0   Glucose, UA NEGATIVE NEGATIVE mg/dL   Hgb urine dipstick TRACE (A) NEGATIVE   Bilirubin Urine NEGATIVE NEGATIVE   Ketones, ur NEGATIVE NEGATIVE mg/dL   Protein, ur NEGATIVE NEGATIVE mg/dL   Nitrite NEGATIVE NEGATIVE   Leukocytes, UA NEGATIVE NEGATIVE  Urine microscopic-add on     Status: Abnormal   Collection Time: 12/15/15 12:45 PM  Result Value Ref Range   Squamous Epithelial / LPF 0-5 (A) NONE SEEN   WBC, UA 0-5 0 - 5 WBC/hpf   RBC / HPF 0-5 0 - 5 RBC/hpf  Bacteria, UA RARE (A) NONE SEEN  Lactate dehydrogenase     Status: Abnormal   Collection Time: 12/15/15  4:40 PM  Result Value Ref Range   LDH 328  (H) 98 - 192 U/L  Lactic acid, plasma     Status: None   Collection Time: 12/15/15  5:13 PM  Result Value Ref Range   Lactic Acid, Venous 0.7 0.5 - 1.9 mmol/L  CBC with Differential/Platelet     Status: Abnormal   Collection Time: 12/16/15 11:13 AM  Result Value Ref Range   WBC 26.6 (H) 4.0 - 10.5 K/uL   RBC 2.30 (L) 4.22 - 5.81 MIL/uL   Hemoglobin 7.1 (L) 13.0 - 17.0 g/dL   HCT 20.9 (L) 39.0 - 52.0 %   MCV 90.9 78.0 - 100.0 fL   MCH 30.9 26.0 - 34.0 pg   MCHC 34.0 30.0 - 36.0 g/dL   RDW 19.3 (H) 11.5 - 15.5 %   Platelets 415 (H) 150 - 400 K/uL   Neutrophils Relative % 78 %   Lymphocytes Relative 9 %   Monocytes Relative 11 %   Eosinophils Relative 2 %   Basophils Relative 0 %   Neutro Abs 20.8 (H) 1.7 - 7.7 K/uL   Lymphs Abs 2.4 0.7 - 4.0 K/uL   Monocytes Absolute 2.9 (H) 0.1 - 1.0 K/uL   Eosinophils Absolute 0.5 0.0 - 0.7 K/uL   Basophils Absolute 0.0 0.0 - 0.1 K/uL   RBC Morphology POLYCHROMASIA PRESENT     Comment: TARGET CELLS HOWELL/JOLLY BODIES SICKLE CELLS   Reticulocytes     Status: Abnormal   Collection Time: 12/16/15 11:13 AM  Result Value Ref Range   Retic Ct Pct 4.6 (H) 0.4 - 3.1 %   RBC. 2.30 (L) 4.22 - 5.81 MIL/uL   Retic Count, Manual 105.8 19.0 - 186.0 K/uL   *Note: Due to a large number of results and/or encounters for the requested time period, some results have not been displayed. A complete set of results can be found in Results Review.    Dg Abd 1 View  Result Date: 12/16/2015 CLINICAL DATA:  Acute abdominal pain.  Nausea and vomiting. EXAM: ABDOMEN - 1 VIEW COMPARISON:  CT yesterday FINDINGS: Enteric contrast from CT yesterday in the distal transverse and descending colon. Small to moderate stool burden. No bowel dilatation to suggest obstruction. No evidence of free air on portable supine view. Cholecystectomy clips in the right upper quadrant of the abdomen. Right hip arthroplasty in place. IMPRESSION: No evidence of bowel obstruction or free air.  Enteric contrast from CT yesterday in the distal transverse and descending colon. Small to moderate stool burden. Electronically Signed   By: Melanie  Ehinger M.D.   On: 12/16/2015 17:35   Ct Abdomen Pelvis W Contrast  Result Date: 12/15/2015 CLINICAL DATA:  Upper abdominal pain, nausea, vomiting since 7 a.m. EXAM: CT ABDOMEN AND PELVIS WITH CONTRAST TECHNIQUE: Multidetector CT imaging of the abdomen and pelvis was performed using the standard protocol following bolus administration of intravenous contrast. CONTRAST:  100mL ISOVUE-300 IOPAMIDOL (ISOVUE-300) INJECTION 61% COMPARISON:  06/03/2015 FINDINGS: Lower chest: Cardiomegaly.  Mild lingular and bibasilar atelectasis. Hepatobiliary: No focal liver abnormality is seen. Status post cholecystectomy. No biliary dilatation. Pancreas: Unremarkable. No pancreatic ductal dilatation or surrounding inflammatory changes. Spleen: Extensive chronic splenic infarction with calcification. Adrenals/Urinary Tract: Adrenal glands are unremarkable. Kidneys are normal, without renal calculi, focal lesion, or hydronephrosis. Bladder is unremarkable. Stomach/Bowel: Bowel wall thickening of the ascending and transverse colon   concerning for colitis. No pneumatosis, pneumoperitoneum or portal venous gas. No bowel obstruction. Vascular/Lymphatic: Normal caliber abdominal aorta with atherosclerosis. No lymphadenopathy. Reproductive: Prostate is unremarkable. Other: No fluid collection or hematoma.  No abdominal wall hernia. Musculoskeletal: No acute osseous abnormality. No aggressive is lytic or sclerotic osseous lesion. Mild osteosclerosis consistent with sickle cell anemia. Right total hip arthroplasty without failure complication. Mild subchondral sclerosis in the left femoral head concerning for avascular necrosis. IMPRESSION: 1. Bowel wall thickening of the ascending and transverse colon concerning for colitis which may be secondary to an infectious, inflammatory or ischemic  etiology given the patient's history of sickle cell anemia. 2. Mild subchondral sclerosis in the left femoral head concerning for avascular necrosis. 3. Stable cardiomegaly. Electronically Signed   By: Kathreen Devoid   On: 12/15/2015 12:57    Review of Systems  Constitutional: Negative for chills and fever.  HENT: Negative for hearing loss.   Eyes: Negative for blurred vision.  Respiratory: Negative for cough and shortness of breath.   Cardiovascular: Negative for chest pain and leg swelling.  Gastrointestinal: Positive for abdominal pain (LLQ) and nausea. Negative for vomiting.  Genitourinary: Negative for dysuria, frequency and urgency.  Skin: Negative for rash.  Neurological: Negative for dizziness and headaches.   Blood pressure 105/77, pulse (!) 112, temperature 99.1 F (37.3 C), temperature source Oral, resp. rate (!) 21, height 6' (1.829 m), weight 80.2 kg (176 lb 12.8 oz), SpO2 93 %. Physical Exam  Constitutional: He is oriented to person, place, and time. He appears well-developed and well-nourished.  HENT:  Head: Normocephalic and atraumatic.  Eyes: Conjunctivae and EOM are normal. Pupils are equal, round, and reactive to light.  Neck: Normal range of motion. Neck supple. No tracheal deviation present.  Cardiovascular: Normal rate and regular rhythm.   Respiratory: Effort normal and breath sounds normal.  GI: Soft. He exhibits distension (mild). There is no tenderness. There is no rebound and no guarding.  Musculoskeletal: Normal range of motion.  Neurological: He is alert and oriented to person, place, and time.  Skin: Skin is warm and dry.    Assessment/Plan: 35 y.o. M here with colitis with worsening pain this afternoon.  Plain films show no free air, and pt has no peritoneal signs at this time.   Will get lactate this evening and follow if elevated.  Abx changed to vanc and zosyn.  Xarelto d/c'd and pt made npo in case a surgical treatment is needed in the near future.  Pt  placed on hep gtt per pharmacy.  Discussed with Dr Zigmund Daniel. Will follow closely.   Kynli Chou C. 41/[id number]/4401, 6:12 PM

## 2015-12-16 NOTE — Progress Notes (Signed)
Pharmacy Antibiotic Note  [name] is a 35 y.o. male admitted on 12/15/2015 with possible colitis vs Burke pain and started on Cipro/Flagyl. Now with worsening abdominal pain and possible acute abdomen.  Pharmacy has been consulted for vancomycin dosing; Zosyn per MD.  Plan:  Vancomycin 1500 mg IV now, then 1000 mg IV q8 hr; goal trough 15-20 mcg/mL  Measure vancomycin trough levels at steady state as indicated  Zosyn per MD, dosing appropriate   Height: 6' (182.9 cm) Weight: 176 lb 12.8 oz (80.2 kg) IBW/kg (Calculated) : 77.6  Temp (24hrs), Avg:98.6 F (37 C), Min:98 F (36.7 C), Max:99.1 F (37.3 C)   Recent Labs Lab 12/12/15 1445 12/15/15 1136 12/15/15 1137 12/15/15 1713 12/16/15 1113  WBC 11.5*  --  22.5*  --  26.6*  CREATININE 0.66 0.95  --   --   --   LATICACIDVEN  --   --   --  0.7  --     Estimated Creatinine Clearance: 118 mL/min (by C-G formula based on SCr of 0.95 mg/dL).    No Known Allergies  Antimicrobials this admission: Cipro/Flagyl 11/14 >> 11/15 Vancomycin 11/15 >>  Zosyn (MD) 11/15 >>  Dose adjustments this admission: ---  Microbiology results: None yet   Thank you for allowing pharmacy to be a part of this patient's care.  Reuel Boom, PharmD, BCPS Pager: [phone] 12/16/2015, 6:46 PM

## 2015-12-17 DIAGNOSIS — D72825 Bandemia: Secondary | ICD-10-CM

## 2015-12-17 DIAGNOSIS — R109 Unspecified abdominal pain: Secondary | ICD-10-CM | POA: Diagnosis not present

## 2015-12-17 DIAGNOSIS — K55039 Acute (reversible) ischemia of large intestine, extent unspecified: Secondary | ICD-10-CM | POA: Diagnosis not present

## 2015-12-17 DIAGNOSIS — R1084 Generalized abdominal pain: Secondary | ICD-10-CM | POA: Diagnosis not present

## 2015-12-17 DIAGNOSIS — D638 Anemia in other chronic diseases classified elsewhere: Secondary | ICD-10-CM | POA: Diagnosis not present

## 2015-12-17 DIAGNOSIS — D5700 Hb-SS disease with crisis, unspecified: Secondary | ICD-10-CM | POA: Diagnosis not present

## 2015-12-17 DIAGNOSIS — K529 Noninfective gastroenteritis and colitis, unspecified: Secondary | ICD-10-CM | POA: Diagnosis not present

## 2015-12-17 LAB — APTT
APTT: 84 s — AB (ref 24–36)
aPTT: 74 seconds — ABNORMAL HIGH (ref 24–36)

## 2015-12-17 LAB — BASIC METABOLIC PANEL
Anion gap: 5 (ref 5–15)
BUN: 12 mg/dL (ref 6–20)
CHLORIDE: 111 mmol/L (ref 101–111)
CO2: 24 mmol/L (ref 22–32)
CREATININE: 0.77 mg/dL (ref 0.61–1.24)
Calcium: 8.8 mg/dL — ABNORMAL LOW (ref 8.9–10.3)
GFR calc Af Amer: >60 mL/min (ref >60)
GFR calc non Af Amer: >60 mL/min (ref >60)
GLUCOSE: 107 mg/dL — AB (ref 65–99)
Potassium: 4 mmol/L (ref 3.5–5.1)
SODIUM: 140 mmol/L (ref 135–145)

## 2015-12-17 LAB — RETICULOCYTES
RBC.: 2.19 MIL/uL — ABNORMAL LOW (ref 4.22–5.81)
RETIC CT PCT: 5.2 % — AB (ref 0.4–3.1)
Retic Count, Absolute: 113.9 10*3/uL (ref 19.0–186.0)

## 2015-12-17 LAB — CBC WITH DIFFERENTIAL/PLATELET
BASOS ABS: 0.1 10*3/uL (ref 0.0–0.1)
Basophils Relative: 0 %
EOS ABS: 0.7 10*3/uL (ref 0.0–0.7)
EOS PCT: 4 %
HCT: 19.7 % — ABNORMAL LOW (ref 39.0–52.0)
Hemoglobin: 6.7 g/dL — CL (ref 13.0–17.0)
LYMPHS ABS: 2.7 10*3/uL (ref 0.7–4.0)
Lymphocytes Relative: 15 %
MCH: 30.6 pg (ref 26.0–34.0)
MCHC: 34 g/dL (ref 30.0–36.0)
MCV: 90 fL (ref 78.0–100.0)
Monocytes Absolute: 2.4 10*3/uL — ABNORMAL HIGH (ref 0.1–1.0)
Monocytes Relative: 14 %
Neutro Abs: 12 10*3/uL — ABNORMAL HIGH (ref 1.7–7.7)
Neutrophils Relative %: 67 %
PLATELETS: 408 10*3/uL — AB (ref 150–400)
RBC: 2.19 MIL/uL — AB (ref 4.22–5.81)
RDW: 19 % — ABNORMAL HIGH (ref 11.5–15.5)
WBC: 18 10*3/uL — AB (ref 4.0–10.5)

## 2015-12-17 LAB — HEPARIN LEVEL (UNFRACTIONATED): Heparin Unfractionated: 0.25 IU/mL — ABNORMAL LOW (ref 0.30–0.70)

## 2015-12-17 MED ORDER — HYDROMORPHONE HCL 2 MG/ML IJ SOLN
2.0000 mg | INTRAMUSCULAR | Status: DC | PRN
  Administered 2015-12-17 – 2015-12-18 (×11): 2 mg via INTRAVENOUS
  Filled 2015-12-17 (×8): qty 1

## 2015-12-17 NOTE — Progress Notes (Signed)
SICKLE CELL SERVICE PROGRESS NOTE  [name] [id number] DOB: Mar 04, 1980 DOA: 12/15/2015 PCP: [name], MD  Assessment/Plan: Active Problems:   Colitis  1. Colitis: Appreciate input fronm General Surgery. Continue Vancomycin and Zosyn. Contiue clear liquids. 2. Hb SS with crisis Continue PCA and Toradol. Also continue IVF.  3. Leukocytosis: Improved. Continue Vancomycin and Zosyn  4. Anemia of Chronic Disease:Hb at baseline 5. Chronic Anticoagulation: Pt was on Xarelto but this was changed to Heparin gtt last night in the event that surgical intervention is required.  6. Pulmonary Hypertension with Chronic Respiratory Failure: Pt on Oxygen 2 l/min chronically at home. Currently at baseline. 7. Chronic pain: Continue MS Contin 8. Medical non-compliance: Per patient he has missed his last [facility] pheresis at [facility].   Code Status: Full Code Family Communication: N/A Disposition Plan: Not yet ready for discharge  [place].  Pager [phone]. If 7PM-7AM, please contact night-coverage.  12/17/2015, 12:06 PM  LOS: 2 days    Interim History: Pt states that pain in abdomen unchanged since yesterday. He has tolerated diet well and has had no diarrhea. He has used 42.6 mg with 79/71:demands/deliveries in the last 24 hours.   Consultants:  None  Procedures:  None  Antibiotics:  Ciprofloxacin 11/14 >>11/15  Metronidazole 11/14 >>11/15  Zosyn 11/15 >>  Vancomycin 11/16 >>   Objective: Vitals:   12/17/15 0621 12/17/15 0742 12/17/15 1042 12/17/15 1150  BP: (!) 114/98  136/79   Pulse: 93  93   Resp: 17 17 16 16   Temp: 98.5 F (36.9 C)  98.6 F (37 C)   TempSrc: Oral  Oral   SpO2: 96% 93% 96% 96%  Weight: 83.4 kg (183 lb 14.4 oz)     Height:       Weight change: -3.719 kg (-8 lb 3.2 oz)  Intake/Output Summary (Last 24 hours) at 12/17/15 1206 Last data filed at 12/17/15 1043  Gross per 24 hour  Intake              480 ml  Output               650 ml  Net             -170 ml    General: Alert, awake, oriented x3, in no acute distress.  HEENT: [place]/AT PEERL, EOMI, anicteric. Neck: Trachea midline,  no masses, no thyromegal,y no JVD, no carotid bruit OROPHARYNX:  Moist, No exudate/ erythema/lesions.  Heart: Regular rate and rhythm, without murmurs, rubs, gallops, PMI non-displaced, no heaves or thrills on palpation.  Lungs: Clear to auscultation, no wheezing or rhonchi noted. No increased vocal fremitus resonant to percussion  Abdomen: Soft,moderate tenderness left periumbilical area, nondistended, positive bowel sounds, no masses no hepatosplenomegaly noted.  Neuro: No focal neurological deficits noted cranial nerves II through XII grossly intact.  Strength at functional baseline in bilateral upper and lower extremities. Musculoskeletal: No warmth swelling or erythema around joints, no spinal tenderness noted. Psychiatric: Patient alert and oriented x3, good insight and cognition, good recent to remote recall.    Data Reviewed: Basic Metabolic Panel:  Recent Labs Lab 12/12/15 1445 12/15/15 1136 12/17/15 0545  NA 136 138 140  K 3.3* 3.7 4.0  CL 106 110 111  CO2 24 22 24   GLUCOSE 93 123* 107*  BUN 8 21* 12  CREATININE 0.66 0.95 0.77  CALCIUM 9.2 8.9 8.8*   Liver Function Tests:  Recent Labs Lab 12/12/15 1445 12/15/15 1136  AST 28 25  ALT 20 18  ALKPHOS 80 79  BILITOT 3.5* 4.8*  PROT 7.7 7.2  ALBUMIN 4.5 4.5    Recent Labs Lab 12/15/15 1136  LIPASE 43   No results for input(s): AMMONIA in the last 168 hours. CBC:  Recent Labs Lab 12/12/15 1445 12/15/15 1137 12/16/15 1113 12/17/15 0545  WBC 11.5* 22.5* 26.6* 18.0*  NEUTROABS 7.9* 20.0* 20.8* 12.0*  HGB 8.0* 7.6* 7.1* 6.7*  HCT 23.9* 22.4* 20.9* 19.7*  MCV 89.5 90.3 90.9 90.0  PLT 373 433* 415* 408*   Cardiac Enzymes: No results for input(s): CKTOTAL, CKMB, CKMBINDEX, TROPONINI in the last 168 hours. BNP (last 3 results)  Recent Labs   08/02/15 2307 11/28/15 0520  BNP 503.2* 422.5*    ProBNP (last 3 results) No results for input(s): PROBNP in the last 8760 hours.  CBG: No results for input(s): GLUCAP in the last 168 hours.  No results found for this or any previous visit (from the past 240 hour(s)).   Studies: Dg Chest 2 View  Result Date: 11/27/2015 CLINICAL DATA:  Sickle cell disease.  Chest pain for 2-3 days. EXAM: CHEST  2 VIEW COMPARISON:  Chest radiograph 11/23/2015 FINDINGS: There is a left chest wall Port-A-Cath with the tip in the proximal right atrium, unchanged. Unchanged cardiomegaly. No pulmonary edema. No focal airspace consolidation. No pleural effusion or pneumothorax. Unchanged hazy opacities and nodular density of the right mid lung. IMPRESSION: Unchanged cardiomegaly without overt pulmonary edema. Unchanged hazy opacities in the right mid lung. Electronically Signed   By: Ulyses Jarred M.D.   On: 11/27/2015 23:05   Dg Chest 2 View  Result Date: 11/23/2015 CLINICAL DATA:  Bilateral side pain for 1 week with hypoxia. EXAM: CHEST  2 VIEW COMPARISON:  11/22/2015 FINDINGS: The lungs are clear wiithout focal pneumonia, edema, pneumothorax or pleural effusion. The cardio pericardial silhouette is enlarged. There is pulmonary vascular congestion without overt pulmonary edema. Nodular areas in the peripheral aspect of the right mid lung were seen on CT scan from 2 weeks ago. Left Port-A-Cath tip projects at the SVC/RA junction. Telemetry leads overlie the chest. IMPRESSION: Cardiomegaly with pulmonary vascular congestion. Nodular density right mid lung better seen on recent CT scan. Electronically Signed   By: Misty Stanley M.D.   On: 11/23/2015 10:34   Dg Abd 1 View  Result Date: 12/16/2015 CLINICAL DATA:  Acute abdominal pain.  Nausea and vomiting. EXAM: ABDOMEN - 1 VIEW COMPARISON:  CT yesterday FINDINGS: Enteric contrast from CT yesterday in the distal transverse and descending colon. Small to moderate  stool burden. No bowel dilatation to suggest obstruction. No evidence of free air on portable supine view. Cholecystectomy clips in the right upper quadrant of the abdomen. Right hip arthroplasty in place. IMPRESSION: No evidence of bowel obstruction or free air. Enteric contrast from CT yesterday in the distal transverse and descending colon. Small to moderate stool burden. Electronically Signed   By: Jeb Levering M.D.   On: 12/16/2015 17:35   Ct Abdomen Pelvis W Contrast  Result Date: 12/15/2015 CLINICAL DATA:  Upper abdominal pain, nausea, vomiting since 7 a.m. EXAM: CT ABDOMEN AND PELVIS WITH CONTRAST TECHNIQUE: Multidetector CT imaging of the abdomen and pelvis was performed using the standard protocol following bolus administration of intravenous contrast. CONTRAST:  152mL ISOVUE-300 IOPAMIDOL (ISOVUE-300) INJECTION 61% COMPARISON:  06/03/2015 FINDINGS: Lower chest: Cardiomegaly.  Mild lingular and bibasilar atelectasis. Hepatobiliary: No focal liver abnormality is seen. Status post cholecystectomy. No biliary dilatation. Pancreas: Unremarkable. No pancreatic  ductal dilatation or surrounding inflammatory changes. Spleen: Extensive chronic splenic infarction with calcification. Adrenals/Urinary Tract: Adrenal glands are unremarkable. Kidneys are normal, without renal calculi, focal lesion, or hydronephrosis. Bladder is unremarkable. Stomach/Bowel: Bowel wall thickening of the ascending and transverse colon concerning for colitis. No pneumatosis, pneumoperitoneum or portal venous gas. No bowel obstruction. Vascular/Lymphatic: Normal caliber abdominal aorta with atherosclerosis. No lymphadenopathy. Reproductive: Prostate is unremarkable. Other: No fluid collection or hematoma.  No abdominal wall hernia. Musculoskeletal: No acute osseous abnormality. No aggressive is lytic or sclerotic osseous lesion. Mild osteosclerosis consistent with sickle cell anemia. Right total hip arthroplasty without failure  complication. Mild subchondral sclerosis in the left femoral head concerning for avascular necrosis. IMPRESSION: 1. Bowel wall thickening of the ascending and transverse colon concerning for colitis which may be secondary to an infectious, inflammatory or ischemic etiology given the patient's history of sickle cell anemia. 2. Mild subchondral sclerosis in the left femoral head concerning for avascular necrosis. 3. Stable cardiomegaly. Electronically Signed   By: Kathreen Devoid   On: 12/15/2015 12:57   Dg Chest Port 1 View  Result Date: 11/22/2015 CLINICAL DATA:  Sickle-cell crisis, acute chest syndrome EXAM: PORTABLE CHEST 1 VIEW COMPARISON:  11/10/2015 FINDINGS: Marked cardiomegaly with mild vascular congestion and diffuse interstitial prominence. Basilar peripheral [place] B-lines noted suspicious for mild basilar interstitial edema pattern. There is associated increased bibasilar atelectasis. No large effusion or pneumothorax. Trachea is midline. Left subclavian power port catheter tip at the SVC RA junction. IMPRESSION: Marked cardiomegaly with mild basilar interstitial edema pattern and increased basilar atelectasis. Electronically Signed   By: Jerilynn Mages.  Shick M.D.   On: 11/22/2015 08:40    Scheduled Meds: . ambrisentan  5 mg Oral QHS  . aspirin  81 mg Oral Daily  . cholecalciferol  2,000 Units Oral Daily  . folic acid  1 mg Oral Daily  . gabapentin  300 mg Oral TID  . HYDROmorphone   Intravenous Q4H  . hydroxyurea  1,000 mg Oral Daily  . ketorolac  30 mg Intravenous Q6H  . lisinopril  5 mg Oral Daily  . morphine  30 mg Oral Q12H  . piperacillin-tazobactam (ZOSYN)  IV  3.375 g Intravenous Q8H  . vancomycin  1,000 mg Intravenous Q8H   Continuous Infusions: . dextrose 5 % and 0.45% NaCl 125 mL/hr at 12/16/15 1738  . heparin 1,300 Units/hr (12/16/15 2245)    Active Problems:   Colitis  In excess of 25 minutes spent during this visit. Greater than 50% involved face to face contact with the  patient for assessment, counseling and coordination of care.

## 2015-12-17 NOTE — Progress Notes (Signed)
Critical HGB value paged to on call hospitalist.

## 2015-12-17 NOTE — Progress Notes (Signed)
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)  No Known Allergies  Patient Measurements: Height: 6' (182.9 cm) Weight: 183 lb 14.4 oz (83.4 kg) IBW/kg (Calculated) : 77.6 Heparin Dosing Weight: used [place]  Vital Signs: Temp: 98.5 F (36.9 C) (11/16 0621) Temp Source: Oral (11/16 0621) BP: 114/98 (11/16 0621) Pulse Rate: 93 (11/16 0621)  Labs:  Recent Labs  12/15/15 1136  12/15/15 1137 12/16/15 1113 12/16/15 1830 12/16/15 1900 12/17/15 0105 12/17/15 0545  HGB  --   < > 7.6* 7.1*  --   --   --  6.7*  HCT  --   --  22.4* 20.9*  --   --   --  19.7*  PLT  --   --  433* 415*  --   --   --  408*  APTT  --   --   --   --   --  44* 84* 74*  LABPROT  --   --   --   --   --  18.5*  --   --   INR  --   --   --   --   --  1.53  --   --   HEPARINUNFRC  --   --   --   --  0.45  --   --  0.25*  CREATININE 0.95  --   --   --   --   --   --  0.77  < > = values in this interval not displayed.  Estimated Creatinine Clearance: 140.1 mL/min (by C-G formula based on SCr of 0.77 mg/dL).   Medical History: Past Medical History:  Diagnosis Date  . Acute chest syndrome ([place]) 06/18/2013  . Acute embolism and thrombosis of right internal jugular vein ([place])   . Alcohol consumption of one to four drinks per day   . Avascular necrosis (HCC)    Right Hip  . Blood transfusion   . Chronic anticoagulation   . Demand ischemia ([place]) 01/02/2014  . Former smoker   . Functional asplenia   . Hb-SS disease with crisis ([place])   . History of Clostridium difficile infection   . History of pulmonary embolus (PE)   . Hypertension   . Hypokalemia   . Leukocytosis    Chronic  . Mood disorder ([place])   . Noncompliance with medication regimen   . Oxygen deficiency   . Pulmonary hypertension   . Second hand tobacco smoke exposure   . Sickle cell anemia (HCC)   . Sickle-cell crisis with associated acute chest syndrome ([place]) 05/13/2013  . Stroke  ([place])   . Thrombocytosis (HCC)    Chronic  . Uses marijuana     Medications:  Prescriptions Prior to Admission  Medication Sig Dispense Refill Last Dose  . ambrisentan (LETAIRIS) 5 MG tablet Take 5 mg by mouth at bedtime.   12/14/2015 at Unknown time  . aspirin 81 MG chewable tablet Chew 1 tablet (81 mg total) by mouth daily. 30 tablet 11 12/15/2015 at 0800  . cholecalciferol (VITAMIN D) 1000 units tablet Take 2,000 Units by mouth daily.   12/15/2015 at Unknown time  . Deferasirox (JADENU) 360 MG TABS Take 1,080 mg by mouth daily.    12/15/2015 at Unknown time  . folic acid (FOLVITE) 1 MG tablet Take 1 mg by mouth daily.   12/15/2015 at Unknown time  . gabapentin (NEURONTIN) 300 MG capsule Take 1 capsule (300 mg total)  by mouth 3 (three) times daily. 270 capsule 3 12/14/2015 at Unknown time  . hydroxyurea (HYDREA) 500 MG capsule Take 1,000 mg by mouth daily. May take with food to minimize GI side effects.   12/15/2015 at Unknown time  . lisinopril (PRINIVIL,ZESTRIL) 5 MG tablet Take 1 tablet (5 mg total) by mouth daily. 30 tablet 1 12/15/2015 at Unknown time  . morphine (MS CONTIN) 30 MG 12 hr tablet Take 1 tablet (30 mg total) by mouth every 12 (twelve) hours. 60 tablet 0 12/15/2015 at Unknown time  . rivaroxaban (XARELTO) 20 MG TABS tablet Take 20 mg by mouth at bedtime.    12/14/2015 at 2100  . torsemide (DEMADEX) 20 MG tablet Take 20 mg by mouth daily as needed (for weight gain greater than 3lbs.).    Past Month at Unknown time  . zolpidem (AMBIEN) 10 MG tablet Take 1 tablet (10 mg total) by mouth at bedtime as needed for sleep. 30 tablet 0 12/14/2015 at Unknown time   Scheduled:  . ambrisentan  5 mg Oral QHS  . aspirin  81 mg Oral Daily  . cholecalciferol  2,000 Units Oral Daily  . folic acid  1 mg Oral Daily  . gabapentin  300 mg Oral TID  . HYDROmorphone   Intravenous Q4H  . hydroxyurea  1,000 mg Oral Daily  . ketorolac  30 mg Intravenous Q6H  . lisinopril  5 mg Oral Daily  .  morphine  30 mg Oral Q12H  . piperacillin-tazobactam (ZOSYN)  IV  3.375 g Intravenous Q8H  . vancomycin  1,000 mg Intravenous Q8H    Assessment: 35 y.o. male with Hx VTE on Xarelto PTA, admitted on 12/15/2015 with possible colitis vs [place] pain, acutely worse today. Surgery consulted and starting IV heparin per pharmacy while off Xarelto.   Baseline INR and HL elevated as expected with recent Xarelto, aPTT mildly elevated but not in therapeutic range  Prior anticoagulation: Xarelto 20 mg daily; last dose 11/13 PM    12/17/2015  APtt=74 at goal, no infusion or bleeding problems per RN  CBC: Hgb low d/t [place] disease, Plt elevated  No bleeding or infusion issues per nursing  CrCl: > 90 ml/min   Goal of Therapy: Heparin level 0.3-0.7 units/ml APTT 66-102 sec Monitor platelets by anticoagulation protocol: Yes  Plan:  Continue heparin drip at 1300 units/hr IV infusion  Check aPTT as HL falsely elevated d/t recent Xarelto  Daily heparin level; continue to check aPTT as needed until heparin level correlating with aPTT   Daily CBC  Monitor for signs of bleeding or thrombosis    Dolly Rias RPh 12/17/2015, 8:30 AM Pager [phone]

## 2015-12-17 NOTE — Progress Notes (Signed)
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)  No Known Allergies  Patient Measurements: Height: 6' (182.9 cm) Weight: 176 lb 12.8 oz (80.2 kg) IBW/kg (Calculated) : 77.6 Heparin Dosing Weight: used [place]  Vital Signs: Temp: 98.7 F (37.1 C) (11/15 2308) Temp Source: Oral (11/15 2308) BP: 115/76 (11/15 2308) Pulse Rate: 88 (11/15 2308)  Labs:  Recent Labs  12/15/15 1136 12/15/15 1137 12/16/15 1113 12/16/15 1830 12/16/15 1900 12/17/15 0105  HGB  --  7.6* 7.1*  --   --   --   HCT  --  22.4* 20.9*  --   --   --   PLT  --  433* 415*  --   --   --   APTT  --   --   --   --  44* 84*  LABPROT  --   --   --   --  18.5*  --   INR  --   --   --   --  1.53  --   HEPARINUNFRC  --   --   --  0.45  --   --   CREATININE 0.95  --   --   --   --   --     Estimated Creatinine Clearance: 118 mL/min (by C-G formula based on SCr of 0.95 mg/dL).   Medical History: Past Medical History:  Diagnosis Date  . Acute chest syndrome ([place]) 06/18/2013  . Acute embolism and thrombosis of right internal jugular vein ([place])   . Alcohol consumption of one to four drinks per day   . Avascular necrosis (HCC)    Right Hip  . Blood transfusion   . Chronic anticoagulation   . Demand ischemia ([place]) 01/02/2014  . Former smoker   . Functional asplenia   . Hb-SS disease with crisis ([place])   . History of Clostridium difficile infection   . History of pulmonary embolus (PE)   . Hypertension   . Hypokalemia   . Leukocytosis    Chronic  . Mood disorder ([place])   . Noncompliance with medication regimen   . Oxygen deficiency   . Pulmonary hypertension   . Second hand tobacco smoke exposure   . Sickle cell anemia (HCC)   . Sickle-cell crisis with associated acute chest syndrome ([place]) 05/13/2013  . Stroke ([place])   . Thrombocytosis (HCC)    Chronic  . Uses marijuana     Medications:  Prescriptions Prior to Admission  Medication Sig Dispense  Refill Last Dose  . ambrisentan (LETAIRIS) 5 MG tablet Take 5 mg by mouth at bedtime.   12/14/2015 at Unknown time  . aspirin 81 MG chewable tablet Chew 1 tablet (81 mg total) by mouth daily. 30 tablet 11 12/15/2015 at 0800  . cholecalciferol (VITAMIN D) 1000 units tablet Take 2,000 Units by mouth daily.   12/15/2015 at Unknown time  . Deferasirox (JADENU) 360 MG TABS Take 1,080 mg by mouth daily.    12/15/2015 at Unknown time  . folic acid (FOLVITE) 1 MG tablet Take 1 mg by mouth daily.   12/15/2015 at Unknown time  . gabapentin (NEURONTIN) 300 MG capsule Take 1 capsule (300 mg total) by mouth 3 (three) times daily. 270 capsule 3 12/14/2015 at Unknown time  . hydroxyurea (HYDREA) 500 MG capsule Take 1,000 mg by mouth daily. May take with food to minimize GI side effects.   12/15/2015 at Unknown time  . lisinopril (PRINIVIL,ZESTRIL) 5 MG  tablet Take 1 tablet (5 mg total) by mouth daily. 30 tablet 1 12/15/2015 at Unknown time  . morphine (MS CONTIN) 30 MG 12 hr tablet Take 1 tablet (30 mg total) by mouth every 12 (twelve) hours. 60 tablet 0 12/15/2015 at Unknown time  . rivaroxaban (XARELTO) 20 MG TABS tablet Take 20 mg by mouth at bedtime.    12/14/2015 at 2100  . torsemide (DEMADEX) 20 MG tablet Take 20 mg by mouth daily as needed (for weight gain greater than 3lbs.).    Past Month at Unknown time  . zolpidem (AMBIEN) 10 MG tablet Take 1 tablet (10 mg total) by mouth at bedtime as needed for sleep. 30 tablet 0 12/14/2015 at Unknown time   Scheduled:  . ambrisentan  5 mg Oral QHS  . aspirin  81 mg Oral Daily  . cholecalciferol  2,000 Units Oral Daily  . folic acid  1 mg Oral Daily  . gabapentin  300 mg Oral TID  . HYDROmorphone   Intravenous Q4H  . hydroxyurea  1,000 mg Oral Daily  . ketorolac  30 mg Intravenous Q6H  . lisinopril  5 mg Oral Daily  . morphine  30 mg Oral Q12H  . piperacillin-tazobactam (ZOSYN)  IV  3.375 g Intravenous Q8H  . vancomycin  1,000 mg Intravenous Q8H     Assessment: 34 y.o. male with Hx VTE on Xarelto PTA, admitted on 12/15/2015 with possible colitis vs [place] pain, acutely worse today. Surgery consulted and starting IV heparin per pharmacy while off Xarelto.   Baseline INR and HL elevated as expected with recent Xarelto, aPTT mildly elevated but not in therapeutic range  Prior anticoagulation: Xarelto 20 mg daily; last dose 11/13 PM  Significant events:  11/15  CBC: Hgb low d/t [place] disease, Plt elevated  No bleeding or infusion issues per nursing  CrCl: > 90 ml/min Today, 11/16  APtt=84 at goal, no infusion or bleeding problems per RN  Goal of Therapy: Heparin level 0.3-0.7 units/ml APTT 66-102 sec Monitor platelets by anticoagulation protocol: Yes  Plan:  Continue heparin drip at 1300 units/hr IV infusion  Check aPTT as HL falsely elevated d/t recent Xarelto  Daily heparin level; continue to check aPTT as needed until heparin level correlating with aPTT   Daily CBC  Monitor for signs of bleeding or thrombosis    Lawana Pai Rph 12/17/2015, 1:44 AM

## 2015-12-17 NOTE — Progress Notes (Signed)
General Surgery [facility]  Assessment & Plan:  Colitis, right colon; left sided abdominal pain  Persistent pain left mid abdomen  Abdominal exam benign, small reducible incisional hernia at umbilicus - stable  Allow clear liquid diet  Leukocytosis improved - WBC 18K, 67% neutrophils  Empiric abx per medical service  Consider GI consult if symptoms persist - ?colonoscopy  Encouraged ambulation in halls        Earnstine Regal, MD, [facility]       Office: [phone]    Subjective: Patient seated in bed, moves comfortably.  Notes left mid abdominal pain. Denies nausea - "hungry".  Passing flatus.  Objective: Vital signs in last 24 hours: Temp:  [97.9 F (36.6 C)-99.1 F (37.3 C)] 98.5 F (36.9 C) (11/16 0621) Pulse Rate:  [88-112] 93 (11/16 0621) Resp:  [14-21] 17 (11/16 0742) BP: (105-117)/(71-98) 114/98 (11/16 0621) SpO2:  [93 %-98 %] 93 % (11/16 0742) Weight:  [83.4 kg (183 lb 14.4 oz)] 83.4 kg (183 lb 14.4 oz) (11/16 0621) Last BM Date: 12/14/15  Intake/Output from previous day: 11/15 0701 - 11/16 0700 In: 480 [P.O.:480] Out: 850 [Urine:850] Intake/Output this shift: No intake/output data recorded.  Physical Exam: HEENT - sclerae clear, mucous membranes moist Neck - soft Abdomen - soft, active BS present; no significant tenderness, no guarding, no mass; umbilical hernia reducible, soft Ext - no edema, non-tender Neuro - alert & oriented, no focal deficits  Lab Results:   Recent Labs  12/16/15 1113 12/17/15 0545  WBC 26.6* 18.0*  HGB 7.1* 6.7*  HCT 20.9* 19.7*  PLT 415* 408*   BMET  Recent Labs  12/15/15 1136 12/17/15 0545  NA 138 140  K 3.7 4.0  CL 110 111  CO2 22 24  GLUCOSE 123* 107*  BUN 21* 12  CREATININE 0.95 0.77  CALCIUM 8.9 8.8*   PT/INR  Recent Labs  12/16/15 1900  LABPROT 18.5*  INR 1.53   Comprehensive Metabolic Panel:    Component Value Date/Time   NA 140  12/17/2015 0545   NA 138 12/15/2015 1136   K 4.0 12/17/2015 0545   K 3.7 12/15/2015 1136   CL 111 12/17/2015 0545   CL 110 12/15/2015 1136   CO2 24 12/17/2015 0545   CO2 22 12/15/2015 1136   BUN 12 12/17/2015 0545   BUN 21 (H) 12/15/2015 1136   CREATININE 0.77 12/17/2015 0545   CREATININE 0.95 12/15/2015 1136   CREATININE 0.64 08/22/2013 1235   GLUCOSE 107 (H) 12/17/2015 0545   GLUCOSE 123 (H) 12/15/2015 1136   CALCIUM 8.8 (L) 12/17/2015 0545   CALCIUM 8.9 12/15/2015 1136   AST 25 12/15/2015 1136   AST 28 12/12/2015 1445   ALT 18 12/15/2015 1136   ALT 20 12/12/2015 1445   ALKPHOS 79 12/15/2015 1136   ALKPHOS 80 12/12/2015 1445   BILITOT 4.8 (H) 12/15/2015 1136   BILITOT 3.5 (H) 12/12/2015 1445   PROT 7.2 12/15/2015 1136   PROT 7.7 12/12/2015 1445   ALBUMIN 4.5 12/15/2015 1136   ALBUMIN 4.5 12/12/2015 1445    Studies/Results: Dg Abd 1 View  Result Date: 12/16/2015 CLINICAL DATA:  Acute abdominal pain.  Nausea and vomiting. EXAM: ABDOMEN - 1 VIEW COMPARISON:  CT yesterday FINDINGS: Enteric contrast from CT yesterday in the distal transverse and descending colon. Small to moderate stool burden. No bowel dilatation to suggest obstruction. No evidence of free air on portable supine  view. Cholecystectomy clips in the right upper quadrant of the abdomen. Right hip arthroplasty in place. IMPRESSION: No evidence of bowel obstruction or free air. Enteric contrast from CT yesterday in the distal transverse and descending colon. Small to moderate stool burden. Electronically Signed   By: Jeb Levering M.D.   On: 12/16/2015 17:35   Ct Abdomen Pelvis W Contrast  Result Date: 12/15/2015 CLINICAL DATA:  Upper abdominal pain, nausea, vomiting since 7 a.m. EXAM: CT ABDOMEN AND PELVIS WITH CONTRAST TECHNIQUE: Multidetector CT imaging of the abdomen and pelvis was performed using the standard protocol following bolus administration of intravenous contrast. CONTRAST:  145mL ISOVUE-300 IOPAMIDOL  (ISOVUE-300) INJECTION 61% COMPARISON:  06/03/2015 FINDINGS: Lower chest: Cardiomegaly.  Mild lingular and bibasilar atelectasis. Hepatobiliary: No focal liver abnormality is seen. Status post cholecystectomy. No biliary dilatation. Pancreas: Unremarkable. No pancreatic ductal dilatation or surrounding inflammatory changes. Spleen: Extensive chronic splenic infarction with calcification. Adrenals/Urinary Tract: Adrenal glands are unremarkable. Kidneys are normal, without renal calculi, focal lesion, or hydronephrosis. Bladder is unremarkable. Stomach/Bowel: Bowel wall thickening of the ascending and transverse colon concerning for colitis. No pneumatosis, pneumoperitoneum or portal venous gas. No bowel obstruction. Vascular/Lymphatic: Normal caliber abdominal aorta with atherosclerosis. No lymphadenopathy. Reproductive: Prostate is unremarkable. Other: No fluid collection or hematoma.  No abdominal wall hernia. Musculoskeletal: No acute osseous abnormality. No aggressive is lytic or sclerotic osseous lesion. Mild osteosclerosis consistent with sickle cell anemia. Right total hip arthroplasty without failure complication. Mild subchondral sclerosis in the left femoral head concerning for avascular necrosis. IMPRESSION: 1. Bowel wall thickening of the ascending and transverse colon concerning for colitis which may be secondary to an infectious, inflammatory or ischemic etiology given the patient's history of sickle cell anemia. 2. Mild subchondral sclerosis in the left femoral head concerning for avascular necrosis. 3. Stable cardiomegaly. Electronically Signed   By: Kathreen Devoid   On: 12/15/2015 12:57      [place] M 12/17/2015  Patient ID: [name], male   DOB: July 11, 1980, 35 y.o.   MRN: [id number]

## 2015-12-17 NOTE — Consult Note (Addendum)
   [facility] CM Inpatient Consult   12/17/2015  MORDECAI OO 07-19-1980 [id number]   Went to bedside to speak with Mr. Newbanks about re-engaging with  [facility] program. Once again, he declines [facility] Care Management follow up. He was active in the past and is familiar with [place] [facility] program services. Denies having any needs at this time. Patient's sickle cell visits appear to be showing up as inpatient hospitalizations in the past 6 months. Therefore, current 25 hospital admissions in past 6 months are not correct. Will make inpatient RNCM aware.   Marthenia Rolling, MSN-Ed, RN,BSN [facility] Liaison [phone]

## 2015-12-18 DIAGNOSIS — R933 Abnormal findings on diagnostic imaging of other parts of digestive tract: Secondary | ICD-10-CM

## 2015-12-18 DIAGNOSIS — R109 Unspecified abdominal pain: Secondary | ICD-10-CM | POA: Diagnosis not present

## 2015-12-18 DIAGNOSIS — K55039 Acute (reversible) ischemia of large intestine, extent unspecified: Secondary | ICD-10-CM | POA: Diagnosis not present

## 2015-12-18 DIAGNOSIS — D72825 Bandemia: Secondary | ICD-10-CM | POA: Diagnosis not present

## 2015-12-18 DIAGNOSIS — D638 Anemia in other chronic diseases classified elsewhere: Secondary | ICD-10-CM | POA: Diagnosis not present

## 2015-12-18 DIAGNOSIS — K529 Noninfective gastroenteritis and colitis, unspecified: Secondary | ICD-10-CM | POA: Diagnosis not present

## 2015-12-18 DIAGNOSIS — D5700 Hb-SS disease with crisis, unspecified: Secondary | ICD-10-CM | POA: Diagnosis not present

## 2015-12-18 DIAGNOSIS — R1084 Generalized abdominal pain: Secondary | ICD-10-CM | POA: Diagnosis not present

## 2015-12-18 LAB — HEPARIN LEVEL (UNFRACTIONATED): HEPARIN UNFRACTIONATED: 0.13 [IU]/mL — AB (ref 0.30–0.70)

## 2015-12-18 LAB — CBC
HCT: 19.2 % — ABNORMAL LOW (ref 39.0–52.0)
HEMOGLOBIN: 6.5 g/dL — AB (ref 13.0–17.0)
MCH: 30.2 pg (ref 26.0–34.0)
MCHC: 33.9 g/dL (ref 30.0–36.0)
MCV: 89.3 fL (ref 78.0–100.0)
PLATELETS: 412 10*3/uL — AB (ref 150–400)
RBC: 2.15 MIL/uL — ABNORMAL LOW (ref 4.22–5.81)
RDW: 19 % — AB (ref 11.5–15.5)
WBC: 14.4 10*3/uL — ABNORMAL HIGH (ref 4.0–10.5)

## 2015-12-18 LAB — APTT: APTT: 70 s — AB (ref 24–36)

## 2015-12-18 LAB — DIFFERENTIAL
BASOS PCT: 1 %
Basophils Absolute: 0.1 10*3/uL (ref 0.0–0.1)
EOS ABS: 0.8 10*3/uL — AB (ref 0.0–0.7)
Eosinophils Relative: 6 %
Lymphocytes Relative: 15 %
Lymphs Abs: 2.1 10*3/uL (ref 0.7–4.0)
MONO ABS: 1.9 10*3/uL — AB (ref 0.1–1.0)
MONOS PCT: 13 %
Neutro Abs: 9.2 10*3/uL — ABNORMAL HIGH (ref 1.7–7.7)
Neutrophils Relative %: 65 %

## 2015-12-18 LAB — RETICULOCYTES
RBC.: 2.13 MIL/uL — AB (ref 4.22–5.81)
RETIC CT PCT: 5.8 % — AB (ref 0.4–3.1)
Retic Count, Absolute: 123.5 10*3/uL (ref 19.0–186.0)

## 2015-12-18 MED ORDER — HYDROMORPHONE 1 MG/ML IV SOLN
INTRAVENOUS | Status: DC
  Administered 2015-12-18: 10 mg via INTRAVENOUS
  Administered 2015-12-18: 2 mg via INTRAVENOUS
  Administered 2015-12-18: 10.6 mg via INTRAVENOUS
  Administered 2015-12-18: 16:00:00 via INTRAVENOUS
  Administered 2015-12-18: 2 mg via INTRAVENOUS
  Administered 2015-12-18: 10.6 mg via INTRAVENOUS
  Administered 2015-12-19 (×2): 2 mg via INTRAVENOUS
  Administered 2015-12-19: 6.7 mg via INTRAVENOUS
  Administered 2015-12-19: 8.8 mg via INTRAVENOUS
  Administered 2015-12-19: 2 mg via INTRAVENOUS
  Administered 2015-12-19: 14 mg via INTRAVENOUS
  Administered 2015-12-19: 2 mg via INTRAVENOUS
  Administered 2015-12-19: 25 mg via INTRAVENOUS
  Administered 2015-12-19: 2 mg via INTRAVENOUS
  Administered 2015-12-19: 8 mg via INTRAVENOUS
  Administered 2015-12-19: 20:00:00 via INTRAVENOUS
  Administered 2015-12-19: 12.4 mg via INTRAVENOUS
  Administered 2015-12-19: 11.8 mg via INTRAVENOUS
  Administered 2015-12-19 – 2015-12-20 (×3): 2 mg via INTRAVENOUS
  Administered 2015-12-20: 6.2 mg via INTRAVENOUS
  Administered 2015-12-20 (×3): 2 mg via INTRAVENOUS
  Administered 2015-12-20: 9.53 mg via INTRAVENOUS
  Administered 2015-12-20: 2.6 mg via INTRAVENOUS
  Administered 2015-12-20: 3.8 mg via INTRAVENOUS
  Administered 2015-12-20: 2 mg via INTRAVENOUS
  Administered 2015-12-20: 13.6 mg via INTRAVENOUS
  Administered 2015-12-20: 7.4 mg via INTRAVENOUS
  Administered 2015-12-20: 2 mg via INTRAVENOUS
  Administered 2015-12-20: 1 mg via INTRAVENOUS
  Administered 2015-12-20 – 2015-12-21 (×2): 2 mg via INTRAVENOUS
  Administered 2015-12-21: 5.4 mg via INTRAVENOUS
  Administered 2015-12-21 (×2): 2 mg via INTRAVENOUS
  Administered 2015-12-21: 12.4 mg via INTRAVENOUS
  Administered 2015-12-21: 12.2 mg via INTRAVENOUS
  Administered 2015-12-21: 8.8 mg via INTRAVENOUS
  Administered 2015-12-21: 2 mg via INTRAVENOUS
  Administered 2015-12-21: 6.68 mg via INTRAVENOUS
  Administered 2015-12-21: 2 mg via INTRAVENOUS
  Administered 2015-12-22: 2.4 mg via INTRAVENOUS
  Administered 2015-12-22: 12 mg via INTRAVENOUS
  Administered 2015-12-22: 1.2 mg via INTRAVENOUS
  Administered 2015-12-22: 01:00:00 via INTRAVENOUS
  Administered 2015-12-22: 10.2 mg via INTRAVENOUS
  Filled 2015-12-18 (×8): qty 25

## 2015-12-18 NOTE — Progress Notes (Signed)
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)  No Known Allergies  Patient Measurements: Height: 6' (182.9 cm) Weight: 183 lb 14.4 oz (83.4 kg) IBW/kg (Calculated) : 77.6 Heparin Dosing Weight: used [place]  Vital Signs: Temp: 98 F (36.7 C) (11/17 1008) Temp Source: Oral (11/17 1008) BP: 124/88 (11/17 1008) Pulse Rate: 79 (11/17 1008)  Labs:  Recent Labs  12/16/15 1113 12/16/15 1830  12/16/15 1900 12/17/15 0105 12/17/15 0545 12/18/15 0547  HGB 7.1*  --   --   --   --  6.7* 6.5*  HCT 20.9*  --   --   --   --  19.7* 19.2*  PLT 415*  --   --   --   --  408* 412*  APTT  --   --   < > 44* 84* 74* 70*  LABPROT  --   --   --  18.5*  --   --   --   INR  --   --   --  1.53  --   --   --   HEPARINUNFRC  --  0.45  --   --   --  0.25* 0.13*  CREATININE  --   --   --   --   --  0.77  --   < > = values in this interval not displayed.  Estimated Creatinine Clearance: 140.1 mL/min (by C-G formula based on SCr of 0.77 mg/dL).   Medical History: Past Medical History:  Diagnosis Date  . Acute chest syndrome ([place]) 06/18/2013  . Acute embolism and thrombosis of right internal jugular vein ([place])   . Alcohol consumption of one to four drinks per day   . Avascular necrosis (HCC)    Right Hip  . Blood transfusion   . Chronic anticoagulation   . Demand ischemia ([place]) 01/02/2014  . Former smoker   . Functional asplenia   . Hb-SS disease with crisis ([place])   . History of Clostridium difficile infection   . History of pulmonary embolus (PE)   . Hypertension   . Hypokalemia   . Leukocytosis    Chronic  . Mood disorder ([place])   . Noncompliance with medication regimen   . Oxygen deficiency   . Pulmonary hypertension   . Second hand tobacco smoke exposure   . Sickle cell anemia (HCC)   . Sickle-cell crisis with associated acute chest syndrome ([place]) 05/13/2013  . Stroke ([place])   . Thrombocytosis (HCC)    Chronic  . Uses  marijuana     Medications:  Prescriptions Prior to Admission  Medication Sig Dispense Refill Last Dose  . ambrisentan (LETAIRIS) 5 MG tablet Take 5 mg by mouth at bedtime.   12/14/2015 at Unknown time  . aspirin 81 MG chewable tablet Chew 1 tablet (81 mg total) by mouth daily. 30 tablet 11 12/15/2015 at 0800  . cholecalciferol (VITAMIN D) 1000 units tablet Take 2,000 Units by mouth daily.   12/15/2015 at Unknown time  . Deferasirox (JADENU) 360 MG TABS Take 1,080 mg by mouth daily.    12/15/2015 at Unknown time  . folic acid (FOLVITE) 1 MG tablet Take 1 mg by mouth daily.   12/15/2015 at Unknown time  . gabapentin (NEURONTIN) 300 MG capsule Take 1 capsule (300 mg total) by mouth 3 (three) times daily. 270 capsule 3 12/14/2015 at Unknown time  . hydroxyurea (HYDREA) 500 MG capsule Take 1,000 mg by mouth daily. May take  with food to minimize GI side effects.   12/15/2015 at Unknown time  . lisinopril (PRINIVIL,ZESTRIL) 5 MG tablet Take 1 tablet (5 mg total) by mouth daily. 30 tablet 1 12/15/2015 at Unknown time  . morphine (MS CONTIN) 30 MG 12 hr tablet Take 1 tablet (30 mg total) by mouth every 12 (twelve) hours. 60 tablet 0 12/15/2015 at Unknown time  . rivaroxaban (XARELTO) 20 MG TABS tablet Take 20 mg by mouth at bedtime.    12/14/2015 at 2100  . torsemide (DEMADEX) 20 MG tablet Take 20 mg by mouth daily as needed (for weight gain greater than 3lbs.).    Past Month at Unknown time  . zolpidem (AMBIEN) 10 MG tablet Take 1 tablet (10 mg total) by mouth at bedtime as needed for sleep. 30 tablet 0 12/14/2015 at Unknown time   Scheduled:  . ambrisentan  5 mg Oral QHS  . aspirin  81 mg Oral Daily  . cholecalciferol  2,000 Units Oral Daily  . folic acid  1 mg Oral Daily  . gabapentin  300 mg Oral TID  . HYDROmorphone   Intravenous Q4H  . hydroxyurea  1,000 mg Oral Daily  . ketorolac  30 mg Intravenous Q6H  . lisinopril  5 mg Oral Daily  . morphine  30 mg Oral Q12H  . piperacillin-tazobactam  (ZOSYN)  IV  3.375 g Intravenous Q8H  . vancomycin  1,000 mg Intravenous Q8H    Assessment: 35 y.o. 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 Surgery consulted and starting IV heparin per pharmacy while off Xarelto.   Baseline INR and HL elevated as expected with recent Xarelto, aPTT mildly elevated but not in therapeutic range  Prior anticoagulation: Xarelto 20 mg daily; last dose 11/13 PM    12/18/2015  APtt=70 at goal, no infusion or bleeding problems per RN  CBC: Hgb low d/t [place] disease, Plt elevated  No bleeding or infusion issues per nursing  CrCl: > 90 ml/min   Goal of Therapy: Heparin level 0.3-0.7 units/ml APTT 66-102 sec Monitor platelets by anticoagulation protocol: Yes  Plan:  Continue heparin drip at 1300 units/hr IV infusion  Check aPTT as HL falsely elevated d/t recent Xarelto  Daily heparin level; continue to check aPTT as needed until heparin level correlating with aPTT   Daily CBC  Monitor for signs of bleeding or thrombosis  F/u when to resume [place] 12/18/2015, 12:34 PM Pager [phone]

## 2015-12-18 NOTE — Progress Notes (Signed)
SICKLE CELL SERVICE PROGRESS NOTE  [name] [id number] DOB: October 02, 1980 DOA: 12/15/2015 PCP: [name], MD  Assessment/Plan: Active Problems:   Colitis  1. Colitis: Appreciate input fronm General Surgery. Continue Vancomycin and Zosyn. Contiue clear liquids. Pt continues to have abdominal pain although improved since yesterday. Awaiting GI consult.   2. Hb SS with crisis Continue PCA and Toradol. Decrease IVF. Marland Kitchen  3. Leukocytosis: Improved. Continue Vancomycin and Zosyn  4. Anemia of Chronic Disease:Hb at baseline 5. Chronic Anticoagulation: Pt was on Xarelto but this was changed to Heparin gtt last night in the event that surgical intervention is required.  6. Pulmonary Hypertension with Chronic Respiratory Failure: Pt on Oxygen 2 l/min chronically at home. Currently at baseline. 7. Chronic pain: Continue MS Contin 8. Medical non-compliance: Per patient he has missed his last [facility] pheresis at [facility].   Code Status: Full Code Family Communication: N/A Disposition Plan: Not yet ready for discharge  [place].  Pager [phone]. If 7PM-7AM, please contact night-coverage.  12/18/2015, 2:10 PM  LOS: 3 days    Interim History: Pt states that pain in abdomen has improved since yesterday and is now 7/10. He reports that he had a watery brown stool this morning. He has been on clear liquid and is requesting diet be advanced.  He has used 28.8 mg with 38/38:demands/deliveries in the last 24 hours.   Consultants:  None  Procedures:  None  Antibiotics:  Ciprofloxacin 11/14 >>11/15  Metronidazole 11/14 >>11/15  Zosyn 11/15 >>  Vancomycin 11/16 >>   Objective: Vitals:   12/18/15 0541 12/18/15 0729 12/18/15 1008 12/18/15 1137  BP: 103/70  124/88   Pulse: 79  79   Resp: 13 14 16 15   Temp: 98 F (36.7 C)  98 F (36.7 C)   TempSrc: Oral  Oral   SpO2: 97% 95% 99% 98%  Weight:      Height:       Weight change:   Intake/Output Summary (Last 24  hours) at 12/18/15 1410 Last data filed at 12/18/15 1252  Gross per 24 hour  Intake             6929 ml  Output             1650 ml  Net             5279 ml    General: Alert, awake, oriented x3, in no acute distress.  HEENT: [place]/AT PEERL, EOMI, anicteric. Neck: Trachea midline,  no masses, no thyromegal,y no JVD, no carotid bruit OROPHARYNX:  Moist, No exudate/ erythema/lesions.  Heart: Regular rate and rhythm, without murmurs, rubs, gallops, PMI non-displaced, no heaves or thrills on palpation.  Lungs: Clear to auscultation, no wheezing or rhonchi noted. No increased vocal fremitus resonant to percussion  Abdomen: Soft,mild tenderness left periumbilical area, nondistended, positive bowel sounds, no masses no hepatosplenomegaly noted.  Neuro: No focal neurological deficits noted cranial nerves II through XII grossly intact.  Strength at functional baseline in bilateral upper and lower extremities. Musculoskeletal: No warmth swelling or erythema around joints, no spinal tenderness noted. Psychiatric: Patient alert and oriented x3, good insight and cognition, good recent to remote recall.    Data Reviewed: Basic Metabolic Panel:  Recent Labs Lab 12/12/15 1445 12/15/15 1136 12/17/15 0545  NA 136 138 140  K 3.3* 3.7 4.0  CL 106 110 111  CO2 24 22 24   GLUCOSE 93 123* 107*  BUN 8 21* 12  CREATININE 0.66 0.95 0.77  CALCIUM  9.2 8.9 8.8*   Liver Function Tests:  Recent Labs Lab 12/12/15 1445 12/15/15 1136  AST 28 25  ALT 20 18  ALKPHOS 80 79  BILITOT 3.5* 4.8*  PROT 7.7 7.2  ALBUMIN 4.5 4.5    Recent Labs Lab 12/15/15 1136  LIPASE 43   No results for input(s): AMMONIA in the last 168 hours. CBC:  Recent Labs Lab 12/12/15 1445 12/15/15 1137 12/16/15 1113 12/17/15 0545 12/18/15 0547  WBC 11.5* 22.5* 26.6* 18.0* 14.4*  NEUTROABS 7.9* 20.0* 20.8* 12.0* 9.2*  HGB 8.0* 7.6* 7.1* 6.7* 6.5*  HCT 23.9* 22.4* 20.9* 19.7* 19.2*  MCV 89.5 90.3 90.9 90.0 89.3  PLT  373 433* 415* 408* 412*   Cardiac Enzymes: No results for input(s): CKTOTAL, CKMB, CKMBINDEX, TROPONINI in the last 168 hours. BNP (last 3 results)  Recent Labs  08/02/15 2307 11/28/15 0520  BNP 503.2* 422.5*    ProBNP (last 3 results) No results for input(s): PROBNP in the last 8760 hours.  CBG: No results for input(s): GLUCAP in the last 168 hours.  No results found for this or any previous visit (from the past 240 hour(s)).   Studies: Dg Chest 2 View  Result Date: 11/27/2015 CLINICAL DATA:  Sickle cell disease.  Chest pain for 2-3 days. EXAM: CHEST  2 VIEW COMPARISON:  Chest radiograph 11/23/2015 FINDINGS: There is a left chest wall Port-A-Cath with the tip in the proximal right atrium, unchanged. Unchanged cardiomegaly. No pulmonary edema. No focal airspace consolidation. No pleural effusion or pneumothorax. Unchanged hazy opacities and nodular density of the right mid lung. IMPRESSION: Unchanged cardiomegaly without overt pulmonary edema. Unchanged hazy opacities in the right mid lung. Electronically Signed   By: Ulyses Jarred M.D.   On: 11/27/2015 23:05   Dg Chest 2 View  Result Date: 11/23/2015 CLINICAL DATA:  Bilateral side pain for 1 week with hypoxia. EXAM: CHEST  2 VIEW COMPARISON:  11/22/2015 FINDINGS: The lungs are clear wiithout focal pneumonia, edema, pneumothorax or pleural effusion. The cardio pericardial silhouette is enlarged. There is pulmonary vascular congestion without overt pulmonary edema. Nodular areas in the peripheral aspect of the right mid lung were seen on CT scan from 2 weeks ago. Left Port-A-Cath tip projects at the SVC/RA junction. Telemetry leads overlie the chest. IMPRESSION: Cardiomegaly with pulmonary vascular congestion. Nodular density right mid lung better seen on recent CT scan. Electronically Signed   By: Misty Stanley M.D.   On: 11/23/2015 10:34   Dg Abd 1 View  Result Date: 12/16/2015 CLINICAL DATA:  Acute abdominal pain.  Nausea and  vomiting. EXAM: ABDOMEN - 1 VIEW COMPARISON:  CT yesterday FINDINGS: Enteric contrast from CT yesterday in the distal transverse and descending colon. Small to moderate stool burden. No bowel dilatation to suggest obstruction. No evidence of free air on portable supine view. Cholecystectomy clips in the right upper quadrant of the abdomen. Right hip arthroplasty in place. IMPRESSION: No evidence of bowel obstruction or free air. Enteric contrast from CT yesterday in the distal transverse and descending colon. Small to moderate stool burden. Electronically Signed   By: Jeb Levering M.D.   On: 12/16/2015 17:35   Ct Abdomen Pelvis W Contrast  Result Date: 12/15/2015 CLINICAL DATA:  Upper abdominal pain, nausea, vomiting since 7 a.m. EXAM: CT ABDOMEN AND PELVIS WITH CONTRAST TECHNIQUE: Multidetector CT imaging of the abdomen and pelvis was performed using the standard protocol following bolus administration of intravenous contrast. CONTRAST:  189mL ISOVUE-300 IOPAMIDOL (ISOVUE-300) INJECTION 61% COMPARISON:  06/03/2015 FINDINGS: Lower chest: Cardiomegaly.  Mild lingular and bibasilar atelectasis. Hepatobiliary: No focal liver abnormality is seen. Status post cholecystectomy. No biliary dilatation. Pancreas: Unremarkable. No pancreatic ductal dilatation or surrounding inflammatory changes. Spleen: Extensive chronic splenic infarction with calcification. Adrenals/Urinary Tract: Adrenal glands are unremarkable. Kidneys are normal, without renal calculi, focal lesion, or hydronephrosis. Bladder is unremarkable. Stomach/Bowel: Bowel wall thickening of the ascending and transverse colon concerning for colitis. No pneumatosis, pneumoperitoneum or portal venous gas. No bowel obstruction. Vascular/Lymphatic: Normal caliber abdominal aorta with atherosclerosis. No lymphadenopathy. Reproductive: Prostate is unremarkable. Other: No fluid collection or hematoma.  No abdominal wall hernia. Musculoskeletal: No acute osseous  abnormality. No aggressive is lytic or sclerotic osseous lesion. Mild osteosclerosis consistent with sickle cell anemia. Right total hip arthroplasty without failure complication. Mild subchondral sclerosis in the left femoral head concerning for avascular necrosis. IMPRESSION: 1. Bowel wall thickening of the ascending and transverse colon concerning for colitis which may be secondary to an infectious, inflammatory or ischemic etiology given the patient's history of sickle cell anemia. 2. Mild subchondral sclerosis in the left femoral head concerning for avascular necrosis. 3. Stable cardiomegaly. Electronically Signed   By: Kathreen Devoid   On: 12/15/2015 12:57   Dg Chest Port 1 View  Result Date: 11/22/2015 CLINICAL DATA:  Sickle-cell crisis, acute chest syndrome EXAM: PORTABLE CHEST 1 VIEW COMPARISON:  11/10/2015 FINDINGS: Marked cardiomegaly with mild vascular congestion and diffuse interstitial prominence. Basilar peripheral Kerley B-lines noted suspicious for mild basilar interstitial edema pattern. There is associated increased bibasilar atelectasis. No large effusion or pneumothorax. Trachea is midline. Left subclavian power port catheter tip at the SVC RA junction. IMPRESSION: Marked cardiomegaly with mild basilar interstitial edema pattern and increased basilar atelectasis. Electronically Signed   By: Jerilynn Mages.  Shick M.D.   On: 11/22/2015 08:40    Scheduled Meds: . ambrisentan  5 mg Oral QHS  . aspirin  81 mg Oral Daily  . cholecalciferol  2,000 Units Oral Daily  . folic acid  1 mg Oral Daily  . gabapentin  300 mg Oral TID  . HYDROmorphone   Intravenous Q4H  . hydroxyurea  1,000 mg Oral Daily  . ketorolac  30 mg Intravenous Q6H  . lisinopril  5 mg Oral Daily  . morphine  30 mg Oral Q12H   Continuous Infusions: . dextrose 5 % and 0.45% NaCl 125 mL/hr at 12/16/15 1738  . heparin 1,300 Units/hr (12/18/15 0932)    Active Problems:   Colitis  In excess of 25 minutes spent during this visit.  Greater than 50% involved face to face contact with the patient for assessment, counseling and coordination of care.

## 2015-12-18 NOTE — Progress Notes (Signed)
General Surgery [facility]  Assessment & Plan:  Colitis, right colon; left sided abdominal pain             Persistent complaints of pain left mid abdomen             Abdominal exam benign, small reducible incisional hernia at umbilicus - stable             Clear liquid diet tolerated             Leukocytosis improved             Empiric abx per medical service             Consider GI consult - ?colonoscopy/?EGD             Encouraged ambulation in halls        Earnstine Regal, MD, [facility]       Office: [phone]    Subjective: Patient seated in bed on phone.  Complains left mid abdominal pain, improved.  Denies nausea.  Wants to eat.  Objective: Vital signs in last 24 hours: Temp:  [98 F (36.7 C)-99 F (37.2 C)] 98 F (36.7 C) (11/17 0541) Pulse Rate:  [78-93] 79 (11/17 0541) Resp:  [13-19] 14 (11/17 0729) BP: (103-136)/(70-88) 103/70 (11/17 0541) SpO2:  [91 %-100 %] 95 % (11/17 0729) Last BM Date: 12/14/15  Intake/Output from previous day: 11/16 0701 - 11/17 0700 In: 6809 [P.O.:240; I.V.:6419; IV Piggyback:150] Out: 1700 [Urine:1700] Intake/Output this shift: No intake/output data recorded.  Physical Exam: HEENT - sclerae clear, mucous membranes moist Neck - soft Abdomen - soft without distension; mild diffuse tenderness, ?mostly epigastric; no guarding or rebound Ext - no edema, non-tender Neuro - alert & oriented, no focal deficits  Lab Results:   Recent Labs  12/17/15 0545 12/18/15 0547  WBC 18.0* 14.4*  HGB 6.7* 6.5*  HCT 19.7* 19.2*  PLT 408* 412*   BMET  Recent Labs  12/15/15 1136 12/17/15 0545  NA 138 140  K 3.7 4.0  CL 110 111  CO2 22 24  GLUCOSE 123* 107*  BUN 21* 12  CREATININE 0.95 0.77  CALCIUM 8.9 8.8*   PT/INR  Recent Labs  12/16/15 1900  LABPROT 18.5*  INR 1.53   Comprehensive Metabolic Panel:    Component Value Date/Time   NA 140 12/17/2015 0545   NA 138  12/15/2015 1136   K 4.0 12/17/2015 0545   K 3.7 12/15/2015 1136   CL 111 12/17/2015 0545   CL 110 12/15/2015 1136   CO2 24 12/17/2015 0545   CO2 22 12/15/2015 1136   BUN 12 12/17/2015 0545   BUN 21 (H) 12/15/2015 1136   CREATININE 0.77 12/17/2015 0545   CREATININE 0.95 12/15/2015 1136   CREATININE 0.64 08/22/2013 1235   GLUCOSE 107 (H) 12/17/2015 0545   GLUCOSE 123 (H) 12/15/2015 1136   CALCIUM 8.8 (L) 12/17/2015 0545   CALCIUM 8.9 12/15/2015 1136   AST 25 12/15/2015 1136   AST 28 12/12/2015 1445   ALT 18 12/15/2015 1136   ALT 20 12/12/2015 1445   ALKPHOS 79 12/15/2015 1136   ALKPHOS 80 12/12/2015 1445   BILITOT 4.8 (H) 12/15/2015 1136   BILITOT 3.5 (H) 12/12/2015 1445   PROT 7.2 12/15/2015 1136   PROT 7.7 12/12/2015 1445   ALBUMIN 4.5 12/15/2015 1136   ALBUMIN 4.5 12/12/2015 1445    Studies/Results: Dg Abd 1  View  Result Date: 12/16/2015 CLINICAL DATA:  Acute abdominal pain.  Nausea and vomiting. EXAM: ABDOMEN - 1 VIEW COMPARISON:  CT yesterday FINDINGS: Enteric contrast from CT yesterday in the distal transverse and descending colon. Small to moderate stool burden. No bowel dilatation to suggest obstruction. No evidence of free air on portable supine view. Cholecystectomy clips in the right upper quadrant of the abdomen. Right hip arthroplasty in place. IMPRESSION: No evidence of bowel obstruction or free air. Enteric contrast from CT yesterday in the distal transverse and descending colon. Small to moderate stool burden. Electronically Signed   By: Jeb Levering [name].D.   On: 12/16/2015 17:35      [name] 12/18/2015  Patient ID: [name], male   DOB: May 12, 1980, 35 y.o.   MRN: [id number]

## 2015-12-18 NOTE — Consult Note (Signed)
[facility] Consult: 1:08 PM 12/18/2015  LOS: 3 days    Referring Provider: Dr Zigmund Daniel  Primary Care Physician:  [name], MD Primary Gastroenterologist:  unassigned    Reason for Consultation:  Abdominal pain.   HPI: [name] is a 35 y.o. male.  PMH sickle cell disease. Frequent hospital encounters/admissions, 44 discharge summaries in 2017 alone.  History recurrent PEs as well as CVA, on Xarelto.  Questionable medication adherence. Pulmonary hypertension, uses nasal cannula oxygen at nighttime. History CVA.  Chronic pain and  prescription narcotics. Alcohol abuse and smokes marijuana.. Functional asplenia.  C. difficile + 06/2014.  S/p umbilical hernia repair and cholecystectomy 2009. 8 hip replacement 2008.  CT scan of the head shows brain atrophy, ischemia and lacunar type infarcts. No previous colonoscopy or upper endoscopy. Last hospital encounter was on 1113/2012 with painful sickle cell crisis.   Presented back to the hospital 11/14 with left-sided abdominal pain which started that morning. It was sharp and nonradiating. It was associated with bilious emesis. Patient describes previous episodes of 5-10 minutes of abdominal cramping on occasion but this happens infrequently, usually less than once per month. Normally he eats pretty well and doesn't suffer from digestive issues. He certainly doesn't get nauseated with any frequency. Contrasted CT scan abdomen pelvis 12/15/15 shows thickening of the descending and transverse colon concerning for colitis, ? infectious, ischemic or inflammatory. Left femoral head changes concerning for avascular necrosis. Stable cardiomegaly. WBCs 22.5 >> 26.6 >>> 18 >> 14.4.   Hgb 7.6>> 7.1 >> 6.7 >> 6.5. Follow-up KUB of 12/16/15 showed residual contrast,  small to moderate stool burden and no obstruction or free air.  Dr. Marcello Moores and Dr. Harlow Asa , general surgery, have seen the patient.   Patient has small, reducible, incisional hernia.  The recommendation is to continue antibiotics and obtain GI consult for possible colonoscopy should symptoms persist.  Patient is requiring frequent use of PCA Dilaudid. He says that the pain on his left abdomen has improved.  In fact he is asking if his diet can be advanced from clears to solid food because he is very hungry.  He had a watery, brown stool this morning, this was the first he had since admission. His bowel movement nor is to have formed bowel movement about every 3 days. Patient says he has not had anything to drink since having some beer at a barbecue about a month ago. He used to drink more heavily. He doesn't smoke.  His pain regimen at home for rib and other joint pain consists of prn Dilaudid, if this is not effective then he resorts to [place] [facility]      Past Medical History:  Diagnosis Date  . Acute chest syndrome ([place]) 06/18/2013  . Acute embolism and thrombosis of right internal jugular vein ([place])   . Alcohol consumption of one to four drinks per day   . Avascular necrosis (HCC)    Right Hip  . Blood transfusion   . Chronic anticoagulation   . Demand ischemia ([place]) 01/02/2014  . Former smoker   .  Functional asplenia   . Hb-SS disease with crisis ([place])   . History of Clostridium difficile infection   . History of pulmonary embolus (PE)   . Hypertension   . Hypokalemia   . Leukocytosis    Chronic  . Mood disorder ([place])   . Noncompliance with medication regimen   . Oxygen deficiency   . Pulmonary hypertension   . Second hand tobacco smoke exposure   . Sickle cell anemia (HCC)   . Sickle-cell crisis with associated acute chest syndrome ([place]) 05/13/2013  . Stroke ([place])   . Thrombocytosis (HCC)    Chronic  . Uses marijuana     Past Surgical History:  Procedure Laterality Date  .  CHOLECYSTECTOMY     01/2008  . Excision of left periauricular cyst     10/2009  . Excision of right ear lobe cyst with primary closur     11/2007  . Porta cath placement    . Porta cath removal    . PORTACATH PLACEMENT  01/05/2012   Procedure: INSERTION PORT-A-CATH;  Surgeon: Odis Hollingshead, MD;  Location: [facility];  Service: General;  Laterality: N/A;  ultrasound guiced port a cath insertion with fluoroscopy  . Right hip replacement     08/2006  . UMBILICAL HERNIA REPAIR     01/2008    Prior to Admission medications   Medication Sig Start Date End Date Taking? Authorizing Provider  ambrisentan (LETAIRIS) 5 MG tablet Take 5 mg by mouth at bedtime.   Yes Historical Provider, MD  aspirin 81 MG chewable tablet Chew 1 tablet (81 mg total) by mouth daily. 07/23/14  Yes [name], MD  cholecalciferol (VITAMIN D) 1000 units tablet Take 2,000 Units by mouth daily.   Yes Historical Provider, MD  Deferasirox (JADENU) 360 MG TABS Take 1,080 mg by mouth daily.    Yes Historical Provider, MD  folic acid (FOLVITE) 1 MG tablet Take 1 mg by mouth daily.   Yes Historical Provider, MD  gabapentin (NEURONTIN) 300 MG capsule Take 1 capsule (300 mg total) by mouth 3 (three) times daily. 06/01/15  Yes Tresa Garter, MD  hydroxyurea (HYDREA) 500 MG capsule Take 1,000 mg by mouth daily. May take with food to minimize GI side effects.   Yes Historical Provider, MD  lisinopril (PRINIVIL,ZESTRIL) 5 MG tablet Take 1 tablet (5 mg total) by mouth daily. 08/04/15  Yes [name], MD  morphine (MS CONTIN) 30 MG 12 hr tablet Take 1 tablet (30 mg total) by mouth every 12 (twelve) hours. 12/06/15 01/05/16 Yes Tresa Garter, MD  rivaroxaban (XARELTO) 20 MG TABS tablet Take 20 mg by mouth at bedtime.    Yes Historical Provider, MD  torsemide (DEMADEX) 20 MG tablet Take 20 mg by mouth daily as needed (for weight gain greater than 3lbs.).    Yes Historical Provider, MD  zolpidem (AMBIEN) 10 MG tablet Take  1 tablet (10 mg total) by mouth at bedtime as needed for sleep. 12/06/15 01/05/16 Yes Tresa Garter, MD    Scheduled Meds: . ambrisentan  5 mg Oral QHS  . aspirin  81 mg Oral Daily  . cholecalciferol  2,000 Units Oral Daily  . folic acid  1 mg Oral Daily  . gabapentin  300 mg Oral TID  . HYDROmorphone   Intravenous Q4H  . hydroxyurea  1,000 mg Oral Daily  . ketorolac  30 mg Intravenous Q6H  . lisinopril  5 mg Oral Daily  . morphine  30 mg Oral Q12H   Infusions: . dextrose 5 % and 0.45% NaCl 125 mL/hr at 12/16/15 1738  . heparin 1,300 Units/hr (12/18/15 0932)   PRN Meds: diphenhydrAMINE **OR** diphenhydrAMINE (BENADRYL) IVPB(SICKLE CELL ONLY), naloxone **AND** sodium chloride flush, ondansetron (ZOFRAN) IV, sodium chloride flush, torsemide, zolpidem   Allergies as of 12/15/2015  . (No Known Allergies)    Family History  Problem Relation Age of Onset  . Sickle cell trait Mother   . Depression Mother   . Diabetes Mother   . Sickle cell trait Father   . Sickle cell trait Brother     Social History   Social History  . Marital status: Single    Spouse name: N/A  . Number of children: 0  . Years of education: 61   Occupational History  . 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 Smoking status: Former Smoker    Packs/day: 0.50    Years: 10.00    Types: Cigarettes    Quit date: 05/29/2011  . Smokeless tobacco: Never Used  . Alcohol use No  . Drug use: No     Comment: Marijuana weekly  . Sexual activity: Yes    Partners: Female     Comment: month ago   Other Topics Concern  . Not on file   Social History Narrative   Lives in an apartment.  Single.  Lives alone but has a girlfriend that helps care for him.  Does not use any assist devices.        [name]  [phone] Mom, emergency contact      [facility]:   Patient continuing to live in her apartment in town alone. Works as a  Art gallery manager. Does have a dog.    REVIEW OF SYSTEMS: Constitutional:  Thin, not acutely ill appearing. Looks comfortable. ENT:  No nose bleeds Pulm:  No shortness of breath or cough. CV:  No palpitations, no LE edema. No chest pain. GU:  No hematuria, no frequency.  No dysuria. GI:  No dysphagia. No bleeding per rectum, melenic or bloody stools. Heme:  No unusual bleeding or bruising.   Transfusions:  Has received several blood transfusions over the years.   Neuro:  No headaches, no peripheral tingling or numbness Derm:  No itching, no rash or sores.  Endocrine:  No sweats or chills.  No polyuria or dysuria Immunization:  Reviewed than immunization record. Had a flu shot 10/2015. Has received both pneumococcal 13 and 23 as well as Tdap vaccinations. Travel:  None beyond local counties in last few months.    PHYSICAL EXAM: Vital signs in last 24 hours: Vitals:   12/18/15 1008 12/18/15 1137  BP: 124/88   Pulse: 79   Resp: 16 15  Temp: 98 F (36.7 C)    Wt Readings from Last 3 Encounters:  12/17/15 83.4 kg (183 lb 14.4 oz)  12/14/15 83.9 kg (185 lb)  12/12/15 83.9 kg (185 lb)    General: Pale, comfortable, not acutely ill-appearing AAM. Head:  No facial asymmetry or swelling. No sign of head trauma.  Eyes:  No scleral icterus, no conjunctival pallor. EOMI. Ears:  Not HOH.  Nose:  No discharge or congestion Mouth:  Moist and clear oral MM. Dentition good. Tongue midline. Neck:  No masses, no TMG, no JVD.  No adenopathy Lungs:  Lungs clear bilaterally. Good breath sounds bilaterally. No cough. No dyspnea. Heart: RRR with  soft systolic murmur. S1, S2 present. Abdomen:  Nondistended, nonobese. Soft. Slight tenderness in the right lower quadrant. No guarding or rebound. Small umbilical hernia. Bowel sounds active. No tingling or tympanitic bowel sounds..   Rectal: Deferred   Musc/Skeltl: No joint erythema, contracture deformities or swelling. Extremities:  No CCE.  Neurologic:   Patient is alert. Oriented 3. Slight psychomotor slowing when answering questions but answers are accurate and appropriate. Moves all 4 limbs, limb strength was not tested. Skin:  No rashes, sores or suspicious lesions. Tattoos:  Multiple large tattoos on the trunk and upper extremities. These all appear professional grade. Psych:  Affect flat but not obviously depressed. Calm. Appropriate.  Intake/Output from previous day: 11/16 0701 - 11/17 0700 In: 6809 [P.O.:240; I.V.:6419; IV Piggyback:150] Out: 1700 [Urine:1700] Intake/Output this shift: Total I/O In: 360 [P.O.:360] Out: 400 [Urine:400]  LAB RESULTS:  Recent Labs  12/16/15 1113 12/17/15 0545 12/18/15 0547  WBC 26.6* 18.0* 14.4*  HGB 7.1* 6.7* 6.5*  HCT 20.9* 19.7* 19.2*  PLT 415* 408* 412*   BMET Lab Results  Component Value Date   NA 140 12/17/2015   NA 138 12/15/2015   NA 136 12/12/2015   K 4.0 12/17/2015   K 3.7 12/15/2015   K 3.3 (L) 12/12/2015   CL 111 12/17/2015   CL 110 12/15/2015   CL 106 12/12/2015   CO2 24 12/17/2015   CO2 22 12/15/2015   CO2 24 12/12/2015   GLUCOSE 107 (H) 12/17/2015   GLUCOSE 123 (H) 12/15/2015   GLUCOSE 93 12/12/2015   BUN 12 12/17/2015   BUN 21 (H) 12/15/2015   BUN 8 12/12/2015   CREATININE 0.77 12/17/2015   CREATININE 0.95 12/15/2015   CREATININE 0.66 12/12/2015   CALCIUM 8.8 (L) 12/17/2015   CALCIUM 8.9 12/15/2015   CALCIUM 9.2 12/12/2015   LFT No results for input(s): PROT, ALBUMIN, AST, ALT, ALKPHOS, BILITOT, BILIDIR, IBILI in the last 72 hours. PT/INR Lab Results  Component Value Date   INR 1.53 12/16/2015   INR 1.70 11/22/2015   INR 1.37 11/10/2015   Hepatitis Panel No results for input(s): HEPBSAG, HCVAB, HEPAIGM, HEPBIGM in the last 72 hours. C-Diff No components found for: CDIFF Lipase     Component Value Date/Time   LIPASE 43 12/15/2015 1136    Drugs of Abuse     Component Value Date/Time   LABOPIA POSITIVE (A) 11/28/2015 0733   COCAINSCRNUR  NONE DETECTED 11/28/2015 0733   COCAINSCRNUR NEG 11/06/2014 1017   LABBENZ NONE DETECTED 11/28/2015 0733   LABBENZ NEGATIVE 02/02/2010 1917   AMPHETMU NONE DETECTED 11/28/2015 0733   THCU POSITIVE (A) 11/28/2015 0733   LABBARB NONE DETECTED 11/28/2015 0733     RADIOLOGY STUDIES: Dg Abd 1 View  Result Date: 12/16/2015 CLINICAL DATA:  Acute abdominal pain.  Nausea and vomiting. EXAM: ABDOMEN - 1 VIEW COMPARISON:  CT yesterday FINDINGS: Enteric contrast from CT yesterday in the distal transverse and descending colon. Small to moderate stool burden. No bowel dilatation to suggest obstruction. No evidence of free air on portable supine view. Cholecystectomy clips in the right upper quadrant of the abdomen. Right hip arthroplasty in place. IMPRESSION: No evidence of bowel obstruction or free air. Enteric contrast from CT yesterday in the distal transverse and descending colon. Small to moderate stool burden. Electronically Signed   By: Jeb Levering M.D.   On: 12/16/2015 17:35     IMPRESSION:   *  Abdominal pain and CT showing a descending, transverse colon colitis.  Question ischemic versus inflammatory versus infectious.  Treated with Zosyn and vancomycin 11/15 - 11/17.  Abdominal pain and leukocytosis improved. No bloody stools. Given the pain, ischemic is probably the most likely etiology.  *  Sickle cell disease. Multiple admission for crises, complications. Note drop in hemoglobin. May need a transfusion.  *  On chronic Xarelto for history of recurrent pulmonary emboli and CVA.  *  Chronic Xarelto for history PE and CVA.  Xarelto on hold. IV heparin gtt in place.    PLAN:     *  Since he is requesting food, I went ahead and changed his diet from clear liquids to soft diet.  If Dr. Hilarie Fredrickson plans flexible sigmoidoscopy or colonoscopy, we can regress the diet back to clear liquids.   Azucena Freed  12/18/2015, 1:08 PM Pager: [phone]

## 2015-12-19 DIAGNOSIS — D649 Anemia, unspecified: Secondary | ICD-10-CM | POA: Diagnosis not present

## 2015-12-19 DIAGNOSIS — D5700 Hb-SS disease with crisis, unspecified: Secondary | ICD-10-CM | POA: Diagnosis not present

## 2015-12-19 DIAGNOSIS — R1033 Periumbilical pain: Secondary | ICD-10-CM

## 2015-12-19 DIAGNOSIS — K529 Noninfective gastroenteritis and colitis, unspecified: Secondary | ICD-10-CM | POA: Diagnosis not present

## 2015-12-19 DIAGNOSIS — R1084 Generalized abdominal pain: Secondary | ICD-10-CM | POA: Diagnosis not present

## 2015-12-19 DIAGNOSIS — K55039 Acute (reversible) ischemia of large intestine, extent unspecified: Secondary | ICD-10-CM | POA: Diagnosis not present

## 2015-12-19 DIAGNOSIS — R109 Unspecified abdominal pain: Secondary | ICD-10-CM | POA: Diagnosis not present

## 2015-12-19 LAB — GASTROINTESTINAL PANEL BY PCR, STOOL (REPLACES STOOL CULTURE)
ADENOVIRUS F40/41: NOT DETECTED
ASTROVIRUS: NOT DETECTED
CAMPYLOBACTER SPECIES: NOT DETECTED
CYCLOSPORA CAYETANENSIS: NOT DETECTED
Cryptosporidium: NOT DETECTED
ENTAMOEBA HISTOLYTICA: NOT DETECTED
ENTEROPATHOGENIC E COLI (EPEC): NOT DETECTED
ENTEROTOXIGENIC E COLI (ETEC): NOT DETECTED
Enteroaggregative E coli (EAEC): NOT DETECTED
Giardia lamblia: NOT DETECTED
NOROVIRUS GI/GII: NOT DETECTED
PLESIMONAS SHIGELLOIDES: NOT DETECTED
Rotavirus A: NOT DETECTED
Salmonella species: NOT DETECTED
Sapovirus (I, II, IV, and V): NOT DETECTED
Shiga like toxin producing E coli (STEC): NOT DETECTED
Shigella/Enteroinvasive E coli (EIEC): NOT DETECTED
VIBRIO CHOLERAE: NOT DETECTED
VIBRIO SPECIES: NOT DETECTED
Yersinia enterocolitica: NOT DETECTED

## 2015-12-19 LAB — CBC
HCT: 18.7 % — ABNORMAL LOW (ref 39.0–52.0)
Hemoglobin: 6.3 g/dL — CL (ref 13.0–17.0)
MCH: 30.1 pg (ref 26.0–34.0)
MCHC: 33.7 g/dL (ref 30.0–36.0)
MCV: 89.5 fL (ref 78.0–100.0)
PLATELETS: 462 10*3/uL — AB (ref 150–400)
RBC: 2.09 MIL/uL — ABNORMAL LOW (ref 4.22–5.81)
RDW: 19.1 % — AB (ref 11.5–15.5)
WBC: 12.9 10*3/uL — ABNORMAL HIGH (ref 4.0–10.5)

## 2015-12-19 LAB — RETICULOCYTES
RBC.: 2.11 MIL/uL — ABNORMAL LOW (ref 4.22–5.81)
Retic Count, Absolute: 120.3 10*3/uL (ref 19.0–186.0)
Retic Ct Pct: 5.7 % — ABNORMAL HIGH (ref 0.4–3.1)

## 2015-12-19 LAB — HEPARIN LEVEL (UNFRACTIONATED)

## 2015-12-19 LAB — APTT: aPTT: 64 seconds — ABNORMAL HIGH (ref 24–36)

## 2015-12-19 MED ORDER — RIVAROXABAN 20 MG PO TABS
20.0000 mg | ORAL_TABLET | Freq: Every day | ORAL | Status: DC
  Administered 2015-12-19 – 2015-12-21 (×3): 20 mg via ORAL
  Filled 2015-12-19 (×3): qty 1

## 2015-12-19 NOTE — Progress Notes (Signed)
SICKLE CELL SERVICE PROGRESS NOTE  MACKLEN WHIDBEE [id number] DOB: November 05, 1980 DOA: 12/15/2015 PCP: [name], MD  Assessment/Plan: Active Problems:   Colitis  1. Colitis: Appreciate input fronm General Surgery. Vancomycin and Zosyn discontinued yesterday Contiue clear liquids. Pt continues to have abdominal pain although improved since yesterday. Spoke with Dr. Hilarie Fredrickson who agrees that this is likely ischemic rather that infectious colitis. Will continue with conservative treatment. If no improvement in 48 hours will proceed with colonoscopy.  2. Hb SS with crisis Continue PCA and Toradol. Decrease IVF. Marland Kitchen  3. Leukocytosis: Improved.  Vancomycin and Zosyn discontinued by Pharmacy. Will start on Augmentin 875 mg BID. 4. Anemia of Chronic Disease:Hb at baseline 5. Chronic Anticoagulation: Changed back to Xarelto as no procedures planned. 6. Pulmonary Hypertension with Chronic Respiratory Failure: Pt on Oxygen 2 l/min chronically at home. Currently at baseline. 7. Chronic pain: Continue MS Contin 8. Medical non-compliance: Per patient he has missed his last RBC pheresis at [facility].   Code Status: Full Code Family Communication: N/A Disposition Plan: Not yet ready for discharge  [place].  Pager [phone]. If 7PM-7AM, please contact night-coverage.  12/19/2015, 1:11 PM  LOS: 4 days    Interim History: Pt states that pain in abdomen has improved since yesterday and is still 7/10 but less intense than yesterday. No further stools. He has tolerated a soft diet.  Consultants:  General Surgery  Gastroenterology  Procedures:  None  Antibiotics:  Ciprofloxacin 11/14 >>11/15  Metronidazole 11/14 >>11/15  Zosyn 11/15 >>11/17  Vancomycin 11/16 >>11/17   Objective: Vitals:   12/19/15 0521 12/19/15 0800 12/19/15 1100 12/19/15 1200  BP: 123/82  122/77   Pulse: (!) 104  83   Resp: 20 13 16 15   Temp: 98.2 F (36.8 C)  98.4 F (36.9 C)   TempSrc: Oral  Oral    SpO2: 94% 94% 99% 98%  Weight: 83.5 kg (184 lb 1.4 oz)     Height:       Weight change:   Intake/Output Summary (Last 24 hours) at 12/19/15 1311 Last data filed at 12/19/15 1100  Gross per 24 hour  Intake           2032.5 ml  Output             1325 ml  Net            707.5 ml    General: Alert, awake, oriented x3, in no acute distress.  HEENT: [place]/AT PEERL, EOMI, anicteric. Neck: Trachea midline,  no masses, no thyromegal,y no JVD, no carotid bruit OROPHARYNX:  Moist, No exudate/ erythema/lesions.  Heart: Regular rate and rhythm, without murmurs, rubs, gallops, PMI non-displaced, no heaves or thrills on palpation.  Lungs: Clear to auscultation, no wheezing or rhonchi noted. No increased vocal fremitus resonant to percussion  Abdomen: Soft,mild tenderness left periumbilical area, nondistended, positive bowel sounds, no masses no hepatosplenomegaly noted.  Neuro: No focal neurological deficits noted cranial nerves II through XII grossly intact.  Strength at functional baseline in bilateral upper and lower extremities. Musculoskeletal: No warmth swelling or erythema around joints, no spinal tenderness noted. Psychiatric: Patient alert and oriented x3, good insight and cognition, good recent to remote recall.    Data Reviewed: Basic Metabolic Panel:  Recent Labs Lab 12/12/15 1445 12/15/15 1136 12/17/15 0545  NA 136 138 140  K 3.3* 3.7 4.0  CL 106 110 111  CO2 24 22 24   GLUCOSE 93 123* 107*  BUN 8 21* 12  CREATININE  0.66 0.95 0.77  CALCIUM 9.2 8.9 8.8*   Liver Function Tests:  Recent Labs Lab 12/12/15 1445 12/15/15 1136  AST 28 25  ALT 20 18  ALKPHOS 80 79  BILITOT 3.5* 4.8*  PROT 7.7 7.2  ALBUMIN 4.5 4.5    Recent Labs Lab 12/15/15 1136  LIPASE 43   No results for input(s): AMMONIA in the last 168 hours. CBC:  Recent Labs Lab 12/12/15 1445 12/15/15 1137 12/16/15 1113 12/17/15 0545 12/18/15 0547 12/19/15 1030  WBC 11.5* 22.5* 26.6* 18.0* 14.4*  12.9*  NEUTROABS 7.9* 20.0* 20.8* 12.0* 9.2*  --   HGB 8.0* 7.6* 7.1* 6.7* 6.5* 6.3*  HCT 23.9* 22.4* 20.9* 19.7* 19.2* 18.7*  MCV 89.5 90.3 90.9 90.0 89.3 89.5  PLT 373 433* 415* 408* 412* 462*   Cardiac Enzymes: No results for input(s): CKTOTAL, CKMB, CKMBINDEX, TROPONINI in the last 168 hours. BNP (last 3 results)  Recent Labs  08/02/15 2307 11/28/15 0520  BNP 503.2* 422.5*    ProBNP (last 3 results) No results for input(s): PROBNP in the last 8760 hours.  CBG: No results for input(s): GLUCAP in the last 168 hours.  No results found for this or any previous visit (from the past 240 hour(s)).   Studies: Dg Chest 2 View  Result Date: 11/27/2015 CLINICAL DATA:  Sickle cell disease.  Chest pain for 2-3 days. EXAM: CHEST  2 VIEW COMPARISON:  Chest radiograph 11/23/2015 FINDINGS: There is a left chest wall Port-A-Cath with the tip in the proximal right atrium, unchanged. Unchanged cardiomegaly. No pulmonary edema. No focal airspace consolidation. No pleural effusion or pneumothorax. Unchanged hazy opacities and nodular density of the right mid lung. IMPRESSION: Unchanged cardiomegaly without overt pulmonary edema. Unchanged hazy opacities in the right mid lung. Electronically Signed   By: Ulyses Jarred M.D.   On: 11/27/2015 23:05   Dg Chest 2 View  Result Date: 11/23/2015 CLINICAL DATA:  Bilateral side pain for 1 week with hypoxia. EXAM: CHEST  2 VIEW COMPARISON:  11/22/2015 FINDINGS: The lungs are clear wiithout focal pneumonia, edema, pneumothorax or pleural effusion. The cardio pericardial silhouette is enlarged. There is pulmonary vascular congestion without overt pulmonary edema. Nodular areas in the peripheral aspect of the right mid lung were seen on CT scan from 2 weeks ago. Left Port-A-Cath tip projects at the SVC/RA junction. Telemetry leads overlie the chest. IMPRESSION: Cardiomegaly with pulmonary vascular congestion. Nodular density right mid lung better seen on recent CT  scan. Electronically Signed   By: Misty Stanley M.D.   On: 11/23/2015 10:34   Dg Abd 1 View  Result Date: 12/16/2015 CLINICAL DATA:  Acute abdominal pain.  Nausea and vomiting. EXAM: ABDOMEN - 1 VIEW COMPARISON:  CT yesterday FINDINGS: Enteric contrast from CT yesterday in the distal transverse and descending colon. Small to moderate stool burden. No bowel dilatation to suggest obstruction. No evidence of free air on portable supine view. Cholecystectomy clips in the right upper quadrant of the abdomen. Right hip arthroplasty in place. IMPRESSION: No evidence of bowel obstruction or free air. Enteric contrast from CT yesterday in the distal transverse and descending colon. Small to moderate stool burden. Electronically Signed   By: Jeb Levering M.D.   On: 12/16/2015 17:35   Ct Abdomen Pelvis W Contrast  Result Date: 12/15/2015 CLINICAL DATA:  Upper abdominal pain, nausea, vomiting since 7 a.m. EXAM: CT ABDOMEN AND PELVIS WITH CONTRAST TECHNIQUE: Multidetector CT imaging of the abdomen and pelvis was performed using the standard protocol  following bolus administration of intravenous contrast. CONTRAST:  121mL ISOVUE-300 IOPAMIDOL (ISOVUE-300) INJECTION 61% COMPARISON:  06/03/2015 FINDINGS: Lower chest: Cardiomegaly.  Mild lingular and bibasilar atelectasis. Hepatobiliary: No focal liver abnormality is seen. Status post cholecystectomy. No biliary dilatation. Pancreas: Unremarkable. No pancreatic ductal dilatation or surrounding inflammatory changes. Spleen: Extensive chronic splenic infarction with calcification. Adrenals/Urinary Tract: Adrenal glands are unremarkable. Kidneys are normal, without renal calculi, focal lesion, or hydronephrosis. Bladder is unremarkable. Stomach/Bowel: Bowel wall thickening of the ascending and transverse colon concerning for colitis. No pneumatosis, pneumoperitoneum or portal venous gas. No bowel obstruction. Vascular/Lymphatic: Normal caliber abdominal aorta with  atherosclerosis. No lymphadenopathy. Reproductive: Prostate is unremarkable. Other: No fluid collection or hematoma.  No abdominal wall hernia. Musculoskeletal: No acute osseous abnormality. No aggressive is lytic or sclerotic osseous lesion. Mild osteosclerosis consistent with sickle cell anemia. Right total hip arthroplasty without failure complication. Mild subchondral sclerosis in the left femoral head concerning for avascular necrosis. IMPRESSION: 1. Bowel wall thickening of the ascending and transverse colon concerning for colitis which may be secondary to an infectious, inflammatory or ischemic etiology given the patient's history of sickle cell anemia. 2. Mild subchondral sclerosis in the left femoral head concerning for avascular necrosis. 3. Stable cardiomegaly. Electronically Signed   By: Kathreen Devoid   On: 12/15/2015 12:57   Dg Chest Port 1 View  Result Date: 11/22/2015 CLINICAL DATA:  Sickle-cell crisis, acute chest syndrome EXAM: PORTABLE CHEST 1 VIEW COMPARISON:  11/10/2015 FINDINGS: Marked cardiomegaly with mild vascular congestion and diffuse interstitial prominence. Basilar peripheral Kerley B-lines noted suspicious for mild basilar interstitial edema pattern. There is associated increased bibasilar atelectasis. No large effusion or pneumothorax. Trachea is midline. Left subclavian power port catheter tip at the SVC RA junction. IMPRESSION: Marked cardiomegaly with mild basilar interstitial edema pattern and increased basilar atelectasis. Electronically Signed   By: Jerilynn Mages.  Shick M.D.   On: 11/22/2015 08:40    Scheduled Meds: . ambrisentan  5 mg Oral QHS  . aspirin  81 mg Oral Daily  . cholecalciferol  2,000 Units Oral Daily  . folic acid  1 mg Oral Daily  . gabapentin  300 mg Oral TID  . HYDROmorphone   Intravenous Q4H  . hydroxyurea  1,000 mg Oral Daily  . ketorolac  30 mg Intravenous Q6H  . lisinopril  5 mg Oral Daily  . morphine  30 mg Oral Q12H  . rivaroxaban  20 mg Oral Q  supper   Continuous Infusions: . dextrose 5 % and 0.45% NaCl 125 mL/hr at 12/18/15 2149    Active Problems:   Colitis  In excess of 25 minutes spent during this visit. Greater than 50% involved face to face contact with the patient for assessment, counseling and coordination of care.

## 2015-12-19 NOTE — Progress Notes (Signed)
Patient ID: [name], male   DOB: 09-Oct-1980, 35 y.o.   MRN: [id number]    Progress Note   Subjective   Still having lower abdominal  pain, but better. Says pain not affected by eating- would like to try regular food  WBC down to 12.9, hgb 6.3 GI path panel -pending, Cdiff not done stool too formed    Objective   Vital signs in last 24 hours: Temp:  [98.2 F (36.8 C)-98.8 F (37.1 C)] 98.4 F (36.9 C) (11/18 1100) Pulse Rate:  [83-110] 83 (11/18 1100) Resp:  [13-22] 16 (11/18 1100) BP: (108-126)/(63-82) 122/77 (11/18 1100) SpO2:  [94 %-99 %] 99 % (11/18 1100) FiO2 (%):  [28 %] 28 % (11/18 0800) Weight:  [184 lb 1.4 oz (83.5 kg)] 184 lb 1.4 oz (83.5 kg) (11/18 0521) Last BM Date: 12/18/15 General:    AA male  in NAD Heart:  Regular rate and rhythm; no murmurs Lungs: Respirations even and unlabored, lungs CTA bilaterally Abdomen:  Soft, mildly tender bilateral LQand nondistended. Normal bowel sounds. Extremities:  Without edema. Neurologic:  Alert and oriented,  grossly normal neurologically. Psych:  Cooperative. Normal mood and affect.  Intake/Output from previous day: 11/17 0701 - 11/18 0700 In: 2152.5 [P.O.:600; I.V.:1552.5] Out: 1425 [Urine:1425] Intake/Output this shift: Total I/O In: 240 [P.O.:240] Out: 300 [Urine:300]  Lab Results:  Recent Labs  12/17/15 0545 12/18/15 0547 12/19/15 1030  WBC 18.0* 14.4* 12.9*  HGB 6.7* 6.5* 6.3*  HCT 19.7* 19.2* 18.7*  PLT 408* 412* 462*   BMET  Recent Labs  12/17/15 0545  NA 140  K 4.0  CL 111  CO2 24  GLUCOSE 107*  BUN 12  CREATININE 0.77  CALCIUM 8.8*   LFT No results for input(s): PROT, ALBUMIN, AST, ALT, ALKPHOS, BILITOT, BILIDIR, IBILI in the last 72 hours. PT/INR  Recent Labs  12/16/15 1900  LABPROT 18.5*  INR 1.53        Assessment / Plan:    #1  36 yo male  With sickle cell, hx of PE and prior CVA on xarelto admitted with abdominal pain, leukocytosis and CT showing a right and  transverse colitis He is improving , findings most consistent with ischemic colitis . #2 narcotic dependent  Stool too formed for cultures Asking for solid food-will advance I do not think Colonoscopy will change management at this time , as he is resolving .    Active Problems:   Colitis     LOS: 4 days   Amy Esterwood  12/19/2015, 12:21 PM

## 2015-12-19 NOTE — Progress Notes (Signed)
  General Surgery [facility]  Assessment & Plan:  Colitis, right colon; left sided abdominal pain             Improved and tolerating regular diet  Will sign off, call as needed.          Milus Height, MD, [facility]       Office: [phone]    Subjective: Pt eating breakfast.  C/o left sided pain, but continues to improve daily.  Denies right sided pain this AM.    Objective: Vital signs in last 24 hours: Temp:  [98 F (36.7 C)-98.8 F (37.1 C)] 98.2 F (36.8 C) (11/18 0521) Pulse Rate:  [79-110] 104 (11/18 0521) Resp:  [13-22] 13 (11/18 0800) BP: (108-126)/(63-88) 123/82 (11/18 0521) SpO2:  [94 %-99 %] 94 % (11/18 0800) FiO2 (%):  [28 %] 28 % (11/18 0800) Weight:  [83.5 kg (184 lb 1.4 oz)] 83.5 kg (184 lb 1.4 oz) (11/18 0521) Last BM Date: 12/18/15  Intake/Output from previous day: 11/17 0701 - 11/18 0700 In: 2152.5 [P.O.:600; I.V.:1552.5] Out: 1425 [Urine:1425] Intake/Output this shift: No intake/output data recorded.  Physical Exam: HEENT - sclerae clear, mucous membranes moist Abdomen - soft, mildly distended.  Mild tenderness LLQ, RUQ. Ext - no edema, non-tender Neuro - alert & oriented, no focal deficits  Lab Results:   Recent Labs  12/17/15 0545 12/18/15 0547  WBC 18.0* 14.4*  HGB 6.7* 6.5*  HCT 19.7* 19.2*  PLT 408* 412*   BMET  Recent Labs  12/17/15 0545  NA 140  K 4.0  CL 111  CO2 24  GLUCOSE 107*  BUN 12  CREATININE 0.77  CALCIUM 8.8*   PT/INR  Recent Labs  12/16/15 1900  LABPROT 18.5*  INR 1.53   Comprehensive Metabolic Panel:    Component Value Date/Time   NA 140 12/17/2015 0545   NA 138 12/15/2015 1136   K 4.0 12/17/2015 0545   K 3.7 12/15/2015 1136   CL 111 12/17/2015 0545   CL 110 12/15/2015 1136   CO2 24 12/17/2015 0545   CO2 22 12/15/2015 1136   BUN 12 12/17/2015 0545   BUN 21 (H) 12/15/2015 1136   CREATININE 0.77 12/17/2015 0545   CREATININE 0.95  12/15/2015 1136   CREATININE 0.64 08/22/2013 1235   GLUCOSE 107 (H) 12/17/2015 0545   GLUCOSE 123 (H) 12/15/2015 1136   CALCIUM 8.8 (L) 12/17/2015 0545   CALCIUM 8.9 12/15/2015 1136   AST 25 12/15/2015 1136   AST 28 12/12/2015 1445   ALT 18 12/15/2015 1136   ALT 20 12/12/2015 1445   ALKPHOS 79 12/15/2015 1136   ALKPHOS 80 12/12/2015 1445   BILITOT 4.8 (H) 12/15/2015 1136   BILITOT 3.5 (H) 12/12/2015 1445   PROT 7.2 12/15/2015 1136   PROT 7.7 12/12/2015 1445   ALBUMIN 4.5 12/15/2015 1136   ALBUMIN 4.5 12/12/2015 1445    Studies/Results: No results found.    [facility] 12/19/2015

## 2015-12-19 NOTE — Progress Notes (Signed)
[place] for Xarelto Indication: Hx VTE   No Known Allergies  Patient Measurements: Height: 6' (182.9 cm) Weight: 184 lb 1.4 oz (83.5 kg) IBW/kg (Calculated) : 77.6 Heparin Dosing Weight: used [place]  Vital Signs: Temp: 98.2 F (36.8 C) (11/18 0521) Temp Source: Oral (11/18 0521) BP: 123/82 (11/18 0521) Pulse Rate: 104 (11/18 0521)  Labs:  Recent Labs  12/16/15 1113 12/16/15 1830  12/16/15 1900 12/17/15 0105 12/17/15 0545 12/18/15 0547  HGB 7.1*  --   --   --   --  6.7* 6.5*  HCT 20.9*  --   --   --   --  19.7* 19.2*  PLT 415*  --   --   --   --  408* 412*  APTT  --   --   < > 44* 84* 74* 70*  LABPROT  --   --   --  18.5*  --   --   --   INR  --   --   --  1.53  --   --   --   HEPARINUNFRC  --  0.45  --   --   --  0.25* 0.13*  CREATININE  --   --   --   --   --  0.77  --   < > = values in this interval not displayed.  Estimated Creatinine Clearance: 140.1 mL/min (by C-G formula based on SCr of 0.77 mg/dL).   Medical History: Past Medical History:  Diagnosis Date  . Acute chest syndrome ([place]) 06/18/2013  . Acute embolism and thrombosis of right internal jugular vein ([place])   . Alcohol consumption of one to four drinks per day   . Avascular necrosis (H[facility])    Right Hip  . Blood transfusion   . Chronic anticoagulation   . Demand ischemia ([place]) 01/02/2014  . Former smoker   . Functional asplenia   . Hb-SS disease with crisis ([place])   . History of Clostridium difficile infection   . History of pulmonary embolus (PE)   . Hypertension   . Hypokalemia   . Leukocytosis    Chronic  . Mood disorder ([place])   . Noncompliance with medication regimen   . Oxygen deficiency   . Pulmonary hypertension   . Second hand tobacco smoke exposure   . Sickle cell anemia (HCC)   . Sickle-cell crisis with associated acute chest syndrome ([name]) 05/13/2013  . Stroke ([place])   . Thrombocytosis (HCC)    Chronic  . Uses marijuana      Medications:  Prescriptions Prior to Admission  Medication Sig Dispense Refill Last Dose  . ambrisentan (LETAIRIS) 5 MG tablet Take 5 mg by mouth at bedtime.   12/14/2015 at Unknown time  . aspirin 81 MG chewable tablet Chew 1 tablet (81 mg total) by mouth daily. 30 tablet 11 12/15/2015 at 0800  . cholecalciferol (VITAMIN D) 1000 units tablet Take 2,000 Units by mouth daily.   12/15/2015 at Unknown time  . Deferasirox (JADENU) 360 MG TABS Take 1,080 mg by mouth daily.    12/15/2015 at Unknown time  . folic acid (FOLVITE) 1 MG tablet Take 1 mg by mouth daily.   12/15/2015 at Unknown time  . gabapentin (NEURONTIN) 300 MG capsule Take 1 capsule (300 mg total) by mouth 3 (three) times daily. 270 capsule 3 12/14/2015 at Unknown time  . hydroxyurea (HYDREA) 500 MG capsule Take 1,000 mg by mouth daily. May take with food to  minimize GI side effects.   12/15/2015 at Unknown time  . lisinopril (PRINIVIL,ZESTRIL) 5 MG tablet Take 1 tablet (5 mg total) by mouth daily. 30 tablet 1 12/15/2015 at Unknown time  . morphine (MS CONTIN) 30 MG 12 hr tablet Take 1 tablet (30 mg total) by mouth every 12 (twelve) hours. 60 tablet 0 12/15/2015 at Unknown time  . rivaroxaban (XARELTO) 20 MG TABS tablet Take 20 mg by mouth at bedtime.    12/14/2015 at 2100  . torsemide (DEMADEX) 20 MG tablet Take 20 mg by mouth daily as needed (for weight gain greater than 3lbs.).    Past Month at Unknown time  . zolpidem (AMBIEN) 10 MG tablet Take 1 tablet (10 mg total) by mouth at bedtime as needed for sleep. 30 tablet 0 12/14/2015 at Unknown time   Scheduled:  . ambrisentan  5 mg Oral QHS  . aspirin  81 mg Oral Daily  . cholecalciferol  2,000 Units Oral Daily  . folic acid  1 mg Oral Daily  . gabapentin  300 mg Oral TID  . HYDROmorphone   Intravenous Q4H  . hydroxyurea  1,000 mg Oral Daily  . ketorolac  30 mg Intravenous Q6H  . lisinopril  5 mg Oral Daily  . morphine  30 mg Oral Q12H    Assessment: 35 y.o. male with Hx  VTE on Xarelto PTA, admitted on 12/15/2015 with possible colitis vs [place] pain, acutely worse today. Surgery consulted and starting IV heparin per pharmacy while off Xarelto.   Baseline INR and HL elevated as expected with recent Xarelto, aPTT mildly elevated but not in therapeutic range  Prior anticoagulation: Xarelto 20 mg daily; last dose 11/13 PM   12/19/2015  Surgery signed off this AM, Md orders to transition IV heparin back to Xarelto as patient was taking prior to admission   No reported bleeding  Hgb low but stable  Goal of Therapy: Monitor renal function, CBC Monitor platelets by anticoagulation protocol: Yes  Plan: 1) Discontinue IV heparin 2) Restart Xarelto 20mg  once daily as per home meds 3) Daily CBC  Adrian Saran, PharmD, BCPS Pager [phone] 12/19/2015 11:06 AM

## 2015-12-20 DIAGNOSIS — R109 Unspecified abdominal pain: Secondary | ICD-10-CM | POA: Diagnosis not present

## 2015-12-20 DIAGNOSIS — K55039 Acute (reversible) ischemia of large intestine, extent unspecified: Secondary | ICD-10-CM | POA: Diagnosis not present

## 2015-12-20 DIAGNOSIS — K559 Vascular disorder of intestine, unspecified: Secondary | ICD-10-CM

## 2015-12-20 LAB — CBC WITH DIFFERENTIAL/PLATELET
BASOS PCT: 1 %
Basophils Absolute: 0.1 10*3/uL (ref 0.0–0.1)
EOS ABS: 0.7 10*3/uL (ref 0.0–0.7)
Eosinophils Relative: 5 %
HCT: 18.3 % — ABNORMAL LOW (ref 39.0–52.0)
Hemoglobin: 6.2 g/dL — CL (ref 13.0–17.0)
LYMPHS ABS: 2.1 10*3/uL (ref 0.7–4.0)
Lymphocytes Relative: 15 %
MCH: 30.1 pg (ref 26.0–34.0)
MCHC: 33.9 g/dL (ref 30.0–36.0)
MCV: 88.8 fL (ref 78.0–100.0)
MONO ABS: 2 10*3/uL — AB (ref 0.1–1.0)
Monocytes Relative: 14 %
NEUTROS ABS: 9.1 10*3/uL — AB (ref 1.7–7.7)
Neutrophils Relative %: 65 %
PLATELETS: 445 10*3/uL — AB (ref 150–400)
RBC: 2.06 MIL/uL — ABNORMAL LOW (ref 4.22–5.81)
RDW: 19.3 % — AB (ref 11.5–15.5)
WBC: 14 10*3/uL — ABNORMAL HIGH (ref 4.0–10.5)

## 2015-12-20 LAB — RETICULOCYTES
RBC.: 2.08 MIL/uL — ABNORMAL LOW (ref 4.22–5.81)
Retic Count, Absolute: 137.3 10*3/uL (ref 19.0–186.0)
Retic Ct Pct: 6.6 % — ABNORMAL HIGH (ref 0.4–3.1)

## 2015-12-20 MED ORDER — AMOXICILLIN-POT CLAVULANATE 875-125 MG PO TABS: 1.0000 | ORAL_TABLET | Freq: Two times a day (BID) | ORAL | Status: DC

## 2015-12-20 NOTE — Progress Notes (Signed)
Patient ID: [name], male   DOB: [birth year], 35 y.o.   MRN: [id number]    Progress Note   Subjective   Sleeping- says he ate without difficulty yesterday, pain still present but not as bed, no diarrhea  GI pathogen panel - negative    Objective   Vital signs in last 24 hours: Temp:  [98.4 F (36.9 C)-99 F (37.2 C)] 99 F (37.2 C) (11/19 0519) Pulse Rate:  [83-105] 105 (11/19 0519) Resp:  [13-22] 20 (11/19 0519) BP: (119-129)/(74-83) 129/78 (11/19 0519) SpO2:  [91 %-99 %] 95 % (11/19 0519) FiO2 (%):  [28 %-29 %] 29 % (11/18 2105) Last BM Date: 12/19/15 General:    AA male  in NAD,sleepy Heart:  Regular rate and rhythm; soft  murmur Lungs: Respirations even and unlabored, lungs CTA bilaterally Abdomen:  Soft, tender suprapubic /pelvic   Nondistended, no guarding or rebound  Normal bowel sounds. Extremities:  Without edema. Neurologic:  Alert and oriented,  grossly normal neurologically. Psych:  Cooperative. Normal mood and affect.  Intake/Output from previous day: 11/18 0701 - 11/19 0700 In: 1161 [P.O.:1080; I.V.:81] Out: 875 [Urine:875] Intake/Output this shift: No intake/output data recorded.  Lab Results:  Recent Labs  12/18/15 0547 12/19/15 1030 12/20/15 0503  WBC 14.4* 12.9* 14.0*  HGB 6.5* 6.3* 6.2*  HCT 19.2* 18.7* 18.3*  PLT 412* 462* 445*   BMET No results for input(s): NA, K, CL, CO2, GLUCOSE, BUN, CREATININE, CALCIUM in the last 72 hours. LFT No results for input(s): PROT, ALBUMIN, AST, ALT, ALKPHOS, BILITOT, BILIDIR, IBILI in the last 72 hours. PT/INR No results for input(s): LABPROT, INR in the last 72 hours.       Assessment / Plan:    #1 35 yo male with SS disease , narcotic dependent with acute abdominal pain, leukocytosis, and CT showing an acute ascending and transverse colon colitis   GI path panel negative This is consistent with an acute ischemic colitis  He is tolerating solid food Do not feel colonoscopy indicated at  present as will not change management  Ischemic colitis will heal , though may continue to have pain for several more days, no antibiotics needed. Hydrate, avoid hypotension  GI will sign off , available if needed  Active Problems:   Colitis   Abdominal pain     LOS: 5 days   Bo Rogue  12/20/2015, 9:54 AM

## 2015-12-21 ENCOUNTER — Inpatient Hospital Stay (HOSPITAL_COMMUNITY): Payer: Medicare Other

## 2015-12-21 DIAGNOSIS — K529 Noninfective gastroenteritis and colitis, unspecified: Secondary | ICD-10-CM | POA: Diagnosis not present

## 2015-12-21 DIAGNOSIS — R109 Unspecified abdominal pain: Secondary | ICD-10-CM | POA: Diagnosis not present

## 2015-12-21 DIAGNOSIS — K55039 Acute (reversible) ischemia of large intestine, extent unspecified: Secondary | ICD-10-CM | POA: Diagnosis not present

## 2015-12-21 DIAGNOSIS — D5700 Hb-SS disease with crisis, unspecified: Secondary | ICD-10-CM | POA: Diagnosis not present

## 2015-12-21 DIAGNOSIS — R0602 Shortness of breath: Secondary | ICD-10-CM | POA: Diagnosis not present

## 2015-12-21 DIAGNOSIS — J9621 Acute and chronic respiratory failure with hypoxia: Secondary | ICD-10-CM

## 2015-12-21 DIAGNOSIS — D649 Anemia, unspecified: Secondary | ICD-10-CM | POA: Diagnosis not present

## 2015-12-21 LAB — PREPARE RBC (CROSSMATCH)

## 2015-12-21 MED ORDER — HYDROMORPHONE HCL 4 MG PO TABS
6.0000 mg | ORAL_TABLET | ORAL | Status: DC
  Administered 2015-12-21 – 2015-12-22 (×7): 6 mg via ORAL
  Filled 2015-12-21 (×7): qty 1

## 2015-12-21 MED ORDER — SODIUM CHLORIDE 0.9 % IV SOLN
Freq: Once | INTRAVENOUS | Status: AC
  Administered 2015-12-21: 13:00:00 via INTRAVENOUS

## 2015-12-21 NOTE — Progress Notes (Signed)
Patient ambulated on O2 at 2L. Oxygen saturations 91-94%.

## 2015-12-21 NOTE — Progress Notes (Signed)
[facility] PROGRESS NOTE  ELDRIGE SLADER [id number] DOB: 1980/04/03 DOA: 12/15/2015 PCP: [name], MD  Assessment/Plan: Active Problems:   Colitis   Abdominal pain  1. Colitis: Pain improved. This is likely ischemic colitis associated with low Oxygen saturation and SCD. Patient is prescribed Oxygen to use ATC but only uses it at night. He states that he does not have a portable concentrator. Will continue regular diet. This is likely to occur again if he is non-adherent to Oxygen use. Will ask CM to arrange a concentrator and portable Oxygen.  2. Hb SS with crisis: Pt still c/o pain which is likely precipitated by low Oxygen tension in the blood causing sickling. Pt has low levels of Hb S due to frequent transfusions and RBC exchange. 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 I will wean IV Dilaudid and transition to oral Dilaudid.  3. Pulmonary Hypertension with acute on Chronic Hypoxia: Oxygen saturations this morning while ambulating with Oxygen were marginal. Will obtain CXR and transfuse 1 unit RBC's.  4. Leukocytosis: Improved. Antibiotics discontinued.  5. Anemia of Chronic Disease:Hb at baseline but patient symptomatic.  6. Chronic Anticoagulation: Changed back to Xarelto as no procedures planned. 7. Pulmonary Hypertension with Chronic Respiratory Failure: Pt on Oxygen 2 l/min chronically at home. Currently at baseline. 8. Chronic pain: I feel that patient has a chronic pain syndrome that is independent of his Sickle Cell Disease as his % f Hb S is very low. I feel that he would benefit from referral to pain management specifically with Suboxone. Will defer to PMD.  9. Medical non-compliance: Per patient he has missed his last RBC pheresis at [facility]. He is also non-adherent to the use of his Oxygen as prescribed.   Code Status: Full Code Family Communication: N/A Disposition Plan: Not yet ready for discharge  [place].  Pager [phone]. If  7PM-7AM, please contact night-coverage.  12/21/2015, 1:02 PM  LOS: 6 days    Interim History: Pt states that pain in abdomen has improved since yesterday and is current;y 6/10. He is also having pain in the area of the ribs at an intensity of 7/10 but less intense than yesterday. No further stools. He has tolerated a soft diet.  Consultants:  General Surgery  Gastroenterology  Procedures:  None  Antibiotics:  Ciprofloxacin 11/14 >>11/15  Metronidazole 11/14 >>11/15  Zosyn 11/15 >>11/17  Vancomycin 11/16 >>11/17   Objective: Vitals:   12/21/15 0626 12/21/15 0800 12/21/15 0939 12/21/15 1200  BP: 125/86  124/85   Pulse: 86  80   Resp: 16 16 15 16   Temp: 99.2 F (37.3 C)  98.9 F (37.2 C)   TempSrc: Oral  Oral   SpO2: 96% 97% 98% 97%  Weight:      Height:       Weight change:   Intake/Output Summary (Last 24 hours) at 12/21/15 1302 Last data filed at 12/21/15 [id number]  Gross per 24 hour  Intake            430.7 ml  Output             1650 ml  Net          -1219.3 ml    General: Alert, awake, oriented x3, in no acute distress.  HEENT: [place]/AT PEERL, EOMI, mild icterus. Neck: Trachea midline,  no masses, no thyromegal,y no JVD, no carotid bruit OROPHARYNX:  Moist, No exudate/ erythema/lesions.  Heart: Regular rate and rhythm, without murmurs, rubs, gallops,  PMI non-displaced, no heaves or thrills on palpation.  Lungs: Clear to auscultation, no wheezing or rhonchi noted. No increased vocal fremitus resonant to percussion  Abdomen: Soft,mild tenderness left periumbilical area, nondistended, positive bowel sounds, no masses no hepatosplenomegaly noted.  Neuro: No focal neurological deficits noted cranial nerves II through XII grossly intact.  Strength at functional baseline in bilateral upper and lower extremities. Musculoskeletal: No warmth swelling or erythema around joints, no spinal tenderness noted. Psychiatric: Patient alert and oriented x3, good insight and  cognition, good recent to remote recall.    Data Reviewed: Basic Metabolic Panel:  Recent Labs Lab 12/15/15 1136 12/17/15 0545  NA 138 140  K 3.7 4.0  CL 110 111  CO2 22 24  GLUCOSE 123* 107*  BUN 21* 12  CREATININE 0.95 0.77  CALCIUM 8.9 8.8*   Liver Function Tests:  Recent Labs Lab 12/15/15 1136  AST 25  ALT 18  ALKPHOS 79  BILITOT 4.8*  PROT 7.2  ALBUMIN 4.5    Recent Labs Lab 12/15/15 1136  LIPASE 43   No results for input(s): AMMONIA in the last 168 hours. CBC:  Recent Labs Lab 12/15/15 1137 12/16/15 1113 12/17/15 0545 12/18/15 0547 12/19/15 1030 12/20/15 0503  WBC 22.5* 26.6* 18.0* 14.4* 12.9* 14.0*  NEUTROABS 20.0* 20.8* 12.0* 9.2*  --  9.1*  HGB 7.6* 7.1* 6.7* 6.5* 6.3* 6.2*  HCT 22.4* 20.9* 19.7* 19.2* 18.7* 18.3*  MCV 90.3 90.9 90.0 89.3 89.5 88.8  PLT 433* 415* 408* 412* 462* 445*   Cardiac Enzymes: No results for input(s): CKTOTAL, CKMB, CKMBINDEX, TROPONINI in the last 168 hours. BNP (last 3 results)  Recent Labs  08/02/15 2307 11/28/15 0520  BNP 503.2* 422.5*    ProBNP (last 3 results) No results for input(s): PROBNP in the last 8760 hours.  CBG: No results for input(s): GLUCAP in the last 168 hours.  Recent Results (from the past 240 hour(s))  Gastrointestinal Panel by PCR , Stool     Status: None   Collection Time: 12/18/15 11:07 PM  Result Value Ref Range Status   Campylobacter species NOT DETECTED NOT DETECTED Final   Plesimonas shigelloides NOT DETECTED NOT DETECTED Final   Salmonella species NOT DETECTED NOT DETECTED Final   Yersinia enterocolitica NOT DETECTED NOT DETECTED Final   Vibrio species NOT DETECTED NOT DETECTED Final   Vibrio cholerae NOT DETECTED NOT DETECTED Final   Enteroaggregative E coli (EAEC) NOT DETECTED NOT DETECTED Final   Enteropathogenic E coli (EPEC) NOT DETECTED NOT DETECTED Final   Enterotoxigenic E coli (ETEC) NOT DETECTED NOT DETECTED Final   Shiga like toxin producing E coli (STEC)  NOT DETECTED NOT DETECTED Final   Shigella/Enteroinvasive E coli (EIEC) NOT DETECTED NOT DETECTED Final   Cryptosporidium NOT DETECTED NOT DETECTED Final   Cyclospora cayetanensis NOT DETECTED NOT DETECTED Final   Entamoeba histolytica NOT DETECTED NOT DETECTED Final   Giardia lamblia NOT DETECTED NOT DETECTED Final   Adenovirus F40/41 NOT DETECTED NOT DETECTED Final   Astrovirus NOT DETECTED NOT DETECTED Final   Norovirus GI/GII NOT DETECTED NOT DETECTED Final   Rotavirus A NOT DETECTED NOT DETECTED Final   Sapovirus (I, II, IV, and V) NOT DETECTED NOT DETECTED Final     Studies: Dg Chest 2 View  Result Date: 11/27/2015 CLINICAL DATA:  Sickle cell disease.  Chest pain for 2-3 days. EXAM: CHEST  2 VIEW COMPARISON:  Chest radiograph 11/23/2015 FINDINGS: There is a left chest wall Port-A-Cath with the tip in  the proximal right atrium, unchanged. Unchanged cardiomegaly. No pulmonary edema. No focal airspace consolidation. No pleural effusion or pneumothorax. Unchanged hazy opacities and nodular density of the right mid lung. IMPRESSION: Unchanged cardiomegaly without overt pulmonary edema. Unchanged hazy opacities in the right mid lung. Electronically Signed   By: Ulyses Jarred M.D.   On: 11/27/2015 23:05   Dg Chest 2 View  Result Date: 11/23/2015 CLINICAL DATA:  Bilateral side pain for 1 week with hypoxia. EXAM: CHEST  2 VIEW COMPARISON:  11/22/2015 FINDINGS: The lungs are clear wiithout focal pneumonia, edema, pneumothorax or pleural effusion. The cardio pericardial silhouette is enlarged. There is pulmonary vascular congestion without overt pulmonary edema. Nodular areas in the peripheral aspect of the right mid lung were seen on CT scan from 2 weeks ago. Left Port-A-Cath tip projects at the SVC/RA junction. Telemetry leads overlie the chest. IMPRESSION: Cardiomegaly with pulmonary vascular congestion. Nodular density right mid lung better seen on recent CT scan. Electronically Signed   By:  Misty Stanley M.D.   On: 11/23/2015 10:34   Dg Abd 1 View  Result Date: 12/16/2015 CLINICAL DATA:  Acute abdominal pain.  Nausea and vomiting. EXAM: ABDOMEN - 1 VIEW COMPARISON:  CT yesterday FINDINGS: Enteric contrast from CT yesterday in the distal transverse and descending colon. Small to moderate stool burden. No bowel dilatation to suggest obstruction. No evidence of free air on portable supine view. Cholecystectomy clips in the right upper quadrant of the abdomen. Right hip arthroplasty in place. IMPRESSION: No evidence of bowel obstruction or free air. Enteric contrast from CT yesterday in the distal transverse and descending colon. Small to moderate stool burden. Electronically Signed   By: Jeb Levering M.D.   On: 12/16/2015 17:35   Ct Abdomen Pelvis W Contrast  Result Date: 12/15/2015 CLINICAL DATA:  Upper abdominal pain, nausea, vomiting since 7 a.m. EXAM: CT ABDOMEN AND PELVIS WITH CONTRAST TECHNIQUE: Multidetector CT imaging of the abdomen and pelvis was performed using the standard protocol following bolus administration of intravenous contrast. CONTRAST:  133mL ISOVUE-300 IOPAMIDOL (ISOVUE-300) INJECTION 61% COMPARISON:  06/03/2015 FINDINGS: Lower chest: Cardiomegaly.  Mild lingular and bibasilar atelectasis. Hepatobiliary: No focal liver abnormality is seen. Status post cholecystectomy. No biliary dilatation. Pancreas: Unremarkable. No pancreatic ductal dilatation or surrounding inflammatory changes. Spleen: Extensive chronic splenic infarction with calcification. Adrenals/Urinary Tract: Adrenal glands are unremarkable. Kidneys are normal, without renal calculi, focal lesion, or hydronephrosis. Bladder is unremarkable. Stomach/Bowel: Bowel wall thickening of the ascending and transverse colon concerning for colitis. No pneumatosis, pneumoperitoneum or portal venous gas. No bowel obstruction. Vascular/Lymphatic: Normal caliber abdominal aorta with atherosclerosis. No lymphadenopathy.  Reproductive: Prostate is unremarkable. Other: No fluid collection or hematoma.  No abdominal wall hernia. Musculoskeletal: No acute osseous abnormality. No aggressive is lytic or sclerotic osseous lesion. Mild osteosclerosis consistent with sickle cell anemia. Right total hip arthroplasty without failure complication. Mild subchondral sclerosis in the left femoral head concerning for avascular necrosis. IMPRESSION: 1. Bowel wall thickening of the ascending and transverse colon concerning for colitis which may be secondary to an infectious, inflammatory or ischemic etiology given the patient's history of sickle cell anemia. 2. Mild subchondral sclerosis in the left femoral head concerning for avascular necrosis. 3. Stable cardiomegaly. Electronically Signed   By: Kathreen Devoid   On: 12/15/2015 12:57   Dg Chest Port 1 View  Result Date: 11/22/2015 CLINICAL DATA:  Sickle-cell crisis, acute chest syndrome EXAM: PORTABLE CHEST 1 VIEW COMPARISON:  11/10/2015 FINDINGS: Marked cardiomegaly with mild vascular  congestion and diffuse interstitial prominence. Basilar peripheral Kerley B-lines noted suspicious for mild basilar interstitial edema pattern. There is associated increased bibasilar atelectasis. No large effusion or pneumothorax. Trachea is midline. Left subclavian power port catheter tip at the SVC RA junction. IMPRESSION: Marked cardiomegaly with mild basilar interstitial edema pattern and increased basilar atelectasis. Electronically Signed   By: Jerilynn Mages.  Shick M.D.   On: 11/22/2015 08:40    Scheduled Meds: . sodium chloride   Intravenous Once  . ambrisentan  5 mg Oral QHS  . aspirin  81 mg Oral Daily  . cholecalciferol  2,000 Units Oral Daily  . folic acid  1 mg Oral Daily  . gabapentin  300 mg Oral TID  . HYDROmorphone   Intravenous Q4H  . HYDROmorphone  6 mg Oral Q4H  . hydroxyurea  1,000 mg Oral Daily  . lisinopril  5 mg Oral Daily  . morphine  30 mg Oral Q12H  . rivaroxaban  20 mg Oral Q supper    Continuous Infusions: . dextrose 5 % and 0.45% NaCl 10 mL/hr at 12/19/15 1311    Active Problems:   Colitis   Abdominal pain  In excess of 25 minutes spent during this visit. Greater than 50% involved face to face contact with the patient for assessment, counseling and coordination of care.

## 2015-12-21 NOTE — Progress Notes (Signed)
Pt made request to MD for smaller portable 02 tank for home as the one he has is too heavy for him to get around. This CM contacted [facility] to voice request for pt. [facility] to follow up with pt. Marney Doctor RN,NCM,BSN

## 2015-12-21 NOTE — Progress Notes (Signed)
[facility] PROGRESS NOTE  [name] [id number] DOB: [birth year] DOA: 12/15/2015 PCP: [name], MD  Assessment/Plan: Active Problems:   Colitis   Abdominal pain  1. Colitis: Pain is slowly improving. Will await decision form GI about scoping. Continue regular diet and pain management.  2. Hb SS with crisis Continue PCA and Toradol. If no decision to do endoscopic evaluation will transition to oral analgesics.  3. Leukocytosis: Improved.  Vancomycin and Zosyn discontinued. Will continue to observe without antibiotics.  4. Anemia of Chronic Disease:Hb at baseline. Will assess oxygenation with ambulation on 2 L/min.  5. Chronic Anticoagulation: Changed back to Xarelto as no procedures planned. 6. Pulmonary Hypertension with Chronic Respiratory Failure: Pt on Oxygen 2 l/min chronically at home. 7. Chronic pain: Continue MS Contin 8. Medical non-compliance: Per patient he has missed his last RBC pheresis at [facility]. He also admits that he is not compliant with use of Oxygen around the clock as he does not have a concentrator.   Code Status: Full Code Family Communication: N/A Disposition Plan: Not yet ready for discharge  [place].  Pager [phone]. If 7PM-7AM, please contact night-coverage.  12/21/2015, 12:49 PM  LOS: 6 days    Interim History: Pt states that pain in abdomen has improved since yesterday and 6/10 y. No further loose stools. Tolerating a soft diet.  Consultants:  General Surgery  Gastroenterology  Procedures:  None  Antibiotics:  Ciprofloxacin 11/14 >>11/15  Metronidazole 11/14 >>11/15  Zosyn 11/15 >>11/17  Vancomycin 11/16 >>11/17   Objective: Vitals:   Vitals:   12/20/15 0501 12/20/15 1700  BP: 129/78   Pulse: 85   Resp: 20 16  Temp: 99 F (37.2 C)    General: Alert, awake, oriented x3, in no acute distress.  HEENT: [place]/AT PEERL, EOMI, anicteric. Heart: Regular rate and rhythm, without murmurs, rubs,  gallops, PMI non-displaced, no heaves or thrills on palpation.  Lungs: Clear to auscultation, no wheezing or rhonchi noted. No increased vocal fremitus resonant to percussion  Abdomen: Soft,mild tenderness left periumbilical area, nondistended, positive bowel sounds, no masses no hepatosplenomegaly noted.  Neuro: No focal neurological deficits noted cranial nerves II through XII grossly intact.  Strength at functional baseline in bilateral upper and lower extremities. Musculoskeletal: No warmth swelling or erythema around joints, no spinal tenderness noted. Psychiatric: Patient alert and oriented x3, good insight and cognition, good recent to remote recall.    Data Reviewed: Basic Metabolic Panel:  Recent Labs Lab 12/15/15 1136 12/17/15 0545  NA 138 140  K 3.7 4.0  CL 110 111  CO2 22 24  GLUCOSE 123* 107*  BUN 21* 12  CREATININE 0.95 0.77  CALCIUM 8.9 8.8*   Liver Function Tests:  Recent Labs Lab 12/15/15 1136  AST 25  ALT 18  ALKPHOS 79  BILITOT 4.8*  PROT 7.2  ALBUMIN 4.5    Recent Labs Lab 12/15/15 1136  LIPASE 43   No results for input(s): AMMONIA in the last 168 hours. CBC:  Recent Labs Lab 12/15/15 1137 12/16/15 1113 12/17/15 0545 12/18/15 0547 12/19/15 1030 12/20/15 0503  WBC 22.5* 26.6* 18.0* 14.4* 12.9* 14.0*  NEUTROABS 20.0* 20.8* 12.0* 9.2*  --  9.1*  HGB 7.6* 7.1* 6.7* 6.5* 6.3* 6.2*  HCT 22.4* 20.9* 19.7* 19.2* 18.7* 18.3*  MCV 90.3 90.9 90.0 89.3 89.5 88.8  PLT 433* 415* 408* 412* 462* 445*   Cardiac Enzymes: No results for input(s): CKTOTAL, CKMB, CKMBINDEX, TROPONINI in the last 168 hours. BNP (last 3  results)  Recent Labs  08/02/15 2307 11/28/15 0520  BNP 503.2* 422.5*    ProBNP (last 3 results) No results for input(s): PROBNP in the last 8760 hours.  CBG: No results for input(s): GLUCAP in the last 168 hours.  Recent Results (from the past 240 hour(s))  Gastrointestinal Panel by PCR , Stool     Status: None   Collection  Time: 12/18/15 11:07 PM  Result Value Ref Range Status   Campylobacter species NOT DETECTED NOT DETECTED Final   Plesimonas shigelloides NOT DETECTED NOT DETECTED Final   Salmonella species NOT DETECTED NOT DETECTED Final   Yersinia enterocolitica NOT DETECTED NOT DETECTED Final   Vibrio species NOT DETECTED NOT DETECTED Final   Vibrio cholerae NOT DETECTED NOT DETECTED Final   Enteroaggregative E coli (EAEC) NOT DETECTED NOT DETECTED Final   Enteropathogenic E coli (EPEC) NOT DETECTED NOT DETECTED Final   Enterotoxigenic E coli (ETEC) NOT DETECTED NOT DETECTED Final   Shiga like toxin producing E coli (STEC) NOT DETECTED NOT DETECTED Final   Shigella/Enteroinvasive E coli (EIEC) NOT DETECTED NOT DETECTED Final   Cryptosporidium NOT DETECTED NOT DETECTED Final   Cyclospora cayetanensis NOT DETECTED NOT DETECTED Final   Entamoeba histolytica NOT DETECTED NOT DETECTED Final   Giardia lamblia NOT DETECTED NOT DETECTED Final   Adenovirus F40/41 NOT DETECTED NOT DETECTED Final   Astrovirus NOT DETECTED NOT DETECTED Final   Norovirus GI/GII NOT DETECTED NOT DETECTED Final   Rotavirus A NOT DETECTED NOT DETECTED Final   Sapovirus (I, II, IV, and V) NOT DETECTED NOT DETECTED Final     Studies: Dg Chest 2 View  Result Date: 11/27/2015 CLINICAL DATA:  Sickle cell disease.  Chest pain for 2-3 days. EXAM: CHEST  2 VIEW COMPARISON:  Chest radiograph 11/23/2015 FINDINGS: There is a left chest wall Port-A-Cath with the tip in the proximal right atrium, unchanged. Unchanged cardiomegaly. No pulmonary edema. No focal airspace consolidation. No pleural effusion or pneumothorax. Unchanged hazy opacities and nodular density of the right mid lung. IMPRESSION: Unchanged cardiomegaly without overt pulmonary edema. Unchanged hazy opacities in the right mid lung. Electronically Signed   By: Ulyses Jarred M.D.   On: 11/27/2015 23:05   Dg Chest 2 View  Result Date: 11/23/2015 CLINICAL DATA:  Bilateral side  pain for 1 week with hypoxia. EXAM: CHEST  2 VIEW COMPARISON:  11/22/2015 FINDINGS: The lungs are clear wiithout focal pneumonia, edema, pneumothorax or pleural effusion. The cardio pericardial silhouette is enlarged. There is pulmonary vascular congestion without overt pulmonary edema. Nodular areas in the peripheral aspect of the right mid lung were seen on CT scan from 2 weeks ago. Left Port-A-Cath tip projects at the SVC/RA junction. Telemetry leads overlie the chest. IMPRESSION: Cardiomegaly with pulmonary vascular congestion. Nodular density right mid lung better seen on recent CT scan. Electronically Signed   By: Misty Stanley M.D.   On: 11/23/2015 10:34   Dg Abd 1 View  Result Date: 12/16/2015 CLINICAL DATA:  Acute abdominal pain.  Nausea and vomiting. EXAM: ABDOMEN - 1 VIEW COMPARISON:  CT yesterday FINDINGS: Enteric contrast from CT yesterday in the distal transverse and descending colon. Small to moderate stool burden. No bowel dilatation to suggest obstruction. No evidence of free air on portable supine view. Cholecystectomy clips in the right upper quadrant of the abdomen. Right hip arthroplasty in place. IMPRESSION: No evidence of bowel obstruction or free air. Enteric contrast from CT yesterday in the distal transverse and descending colon. Small to  moderate stool burden. Electronically Signed   By: Jeb Levering M.D.   On: 12/16/2015 17:35   Ct Abdomen Pelvis W Contrast  Result Date: 12/15/2015 CLINICAL DATA:  Upper abdominal pain, nausea, vomiting since 7 a.m. EXAM: CT ABDOMEN AND PELVIS WITH CONTRAST TECHNIQUE: Multidetector CT imaging of the abdomen and pelvis was performed using the standard protocol following bolus administration of intravenous contrast. CONTRAST:  148mL ISOVUE-300 IOPAMIDOL (ISOVUE-300) INJECTION 61% COMPARISON:  06/03/2015 FINDINGS: Lower chest: Cardiomegaly.  Mild lingular and bibasilar atelectasis. Hepatobiliary: No focal liver abnormality is seen. Status post  cholecystectomy. No biliary dilatation. Pancreas: Unremarkable. No pancreatic ductal dilatation or surrounding inflammatory changes. Spleen: Extensive chronic splenic infarction with calcification. Adrenals/Urinary Tract: Adrenal glands are unremarkable. Kidneys are normal, without renal calculi, focal lesion, or hydronephrosis. Bladder is unremarkable. Stomach/Bowel: Bowel wall thickening of the ascending and transverse colon concerning for colitis. No pneumatosis, pneumoperitoneum or portal venous gas. No bowel obstruction. Vascular/Lymphatic: Normal caliber abdominal aorta with atherosclerosis. No lymphadenopathy. Reproductive: Prostate is unremarkable. Other: No fluid collection or hematoma.  No abdominal wall hernia. Musculoskeletal: No acute osseous abnormality. No aggressive is lytic or sclerotic osseous lesion. Mild osteosclerosis consistent with sickle cell anemia. Right total hip arthroplasty without failure complication. Mild subchondral sclerosis in the left femoral head concerning for avascular necrosis. IMPRESSION: 1. Bowel wall thickening of the ascending and transverse colon concerning for colitis which may be secondary to an infectious, inflammatory or ischemic etiology given the patient's history of sickle cell anemia. 2. Mild subchondral sclerosis in the left femoral head concerning for avascular necrosis. 3. Stable cardiomegaly. Electronically Signed   By: Kathreen Devoid   On: 12/15/2015 12:57   Dg Chest Port 1 View  Result Date: 11/22/2015 CLINICAL DATA:  Sickle-cell crisis, acute chest syndrome EXAM: PORTABLE CHEST 1 VIEW COMPARISON:  11/10/2015 FINDINGS: Marked cardiomegaly with mild vascular congestion and diffuse interstitial prominence. Basilar peripheral Kerley B-lines noted suspicious for mild basilar interstitial edema pattern. There is associated increased bibasilar atelectasis. No large effusion or pneumothorax. Trachea is midline. Left subclavian power port catheter tip at the SVC  RA junction. IMPRESSION: Marked cardiomegaly with mild basilar interstitial edema pattern and increased basilar atelectasis. Electronically Signed   By: Jerilynn Mages.  Shick M.D.   On: 11/22/2015 08:40    Scheduled Meds: . sodium chloride   Intravenous Once  . ambrisentan  5 mg Oral QHS  . aspirin  81 mg Oral Daily  . cholecalciferol  2,000 Units Oral Daily  . folic acid  1 mg Oral Daily  . gabapentin  300 mg Oral TID  . HYDROmorphone   Intravenous Q4H  . HYDROmorphone  6 mg Oral Q4H  . hydroxyurea  1,000 mg Oral Daily  . lisinopril  5 mg Oral Daily  . morphine  30 mg Oral Q12H  . rivaroxaban  20 mg Oral Q supper   Continuous Infusions: . dextrose 5 % and 0.45% NaCl 10 mL/hr at 12/19/15 1311    Active Problems:   Colitis   Abdominal pain  In excess of 25 minutes spent during this visit. Greater than 50% involved face to face contact with the patient for assessment, counseling and coordination of care.

## 2015-12-22 DIAGNOSIS — D5700 Hb-SS disease with crisis, unspecified: Secondary | ICD-10-CM | POA: Diagnosis not present

## 2015-12-22 DIAGNOSIS — D649 Anemia, unspecified: Secondary | ICD-10-CM | POA: Diagnosis not present

## 2015-12-22 DIAGNOSIS — K55039 Acute (reversible) ischemia of large intestine, extent unspecified: Secondary | ICD-10-CM | POA: Diagnosis not present

## 2015-12-22 DIAGNOSIS — R1033 Periumbilical pain: Secondary | ICD-10-CM | POA: Diagnosis not present

## 2015-12-22 DIAGNOSIS — Z7901 Long term (current) use of anticoagulants: Secondary | ICD-10-CM

## 2015-12-22 DIAGNOSIS — R109 Unspecified abdominal pain: Secondary | ICD-10-CM | POA: Diagnosis not present

## 2015-12-22 DIAGNOSIS — K559 Vascular disorder of intestine, unspecified: Secondary | ICD-10-CM | POA: Diagnosis not present

## 2015-12-22 LAB — CBC WITH DIFFERENTIAL/PLATELET
BASOS PCT: 1 %
Basophils Absolute: 0.1 10*3/uL (ref 0.0–0.1)
EOS ABS: 0.5 10*3/uL (ref 0.0–0.7)
EOS PCT: 3 %
HCT: 20.1 % — ABNORMAL LOW (ref 39.0–52.0)
HEMOGLOBIN: 6.8 g/dL — AB (ref 13.0–17.0)
Lymphocytes Relative: 13 %
Lymphs Abs: 2 10*3/uL (ref 0.7–4.0)
MCH: 29.7 pg (ref 26.0–34.0)
MCHC: 33.8 g/dL (ref 30.0–36.0)
MCV: 87.8 fL (ref 78.0–100.0)
Monocytes Absolute: 2.4 10*3/uL — ABNORMAL HIGH (ref 0.1–1.0)
Monocytes Relative: 16 %
NEUTROS PCT: 68 %
Neutro Abs: 10.4 10*3/uL — ABNORMAL HIGH (ref 1.7–7.7)
PLATELETS: 486 10*3/uL — AB (ref 150–400)
RBC: 2.29 MIL/uL — AB (ref 4.22–5.81)
RDW: 20.4 % — ABNORMAL HIGH (ref 11.5–15.5)
WBC: 15.4 10*3/uL — AB (ref 4.0–10.5)

## 2015-12-22 LAB — TYPE AND SCREEN
ABO/RH(D): O POS
Antibody Screen: NEGATIVE
UNIT DIVISION: 0

## 2015-12-22 LAB — BASIC METABOLIC PANEL
Anion gap: 5 (ref 5–15)
BUN: 7 mg/dL (ref 6–20)
CALCIUM: 8.7 mg/dL — AB (ref 8.9–10.3)
CHLORIDE: 108 mmol/L (ref 101–111)
CO2: 24 mmol/L (ref 22–32)
CREATININE: 0.76 mg/dL (ref 0.61–1.24)
GFR calc non Af Amer: >60 mL/min (ref >60)
Glucose, Bld: 116 mg/dL — ABNORMAL HIGH (ref 65–99)
Potassium: 3.3 mmol/L — ABNORMAL LOW (ref 3.5–5.1)
Sodium: 137 mmol/L (ref 135–145)

## 2015-12-22 LAB — RETICULOCYTES
RBC.: 2.29 MIL/uL — AB (ref 4.22–5.81)
RETIC CT PCT: 6.5 % — AB (ref 0.4–3.1)
Retic Count, Absolute: 148.9 10*3/uL (ref 19.0–186.0)

## 2015-12-22 MED ORDER — HEPARIN SOD (PORK) LOCK FLUSH 100 UNIT/ML IV SOLN
500.0000 [IU] | Freq: Once | INTRAVENOUS | Status: AC
  Administered 2015-12-22: 500 [IU] via INTRAVENOUS
  Filled 2015-12-22: qty 5

## 2015-12-22 MED ORDER — POTASSIUM CHLORIDE CRYS ER 20 MEQ PO TBCR: 40.0000 meq | EXTENDED_RELEASE_TABLET | Freq: Every day | ORAL | 1 refills | Status: DC

## 2015-12-22 MED ORDER — POTASSIUM CHLORIDE CRYS ER 20 MEQ PO TBCR
40.0000 meq | EXTENDED_RELEASE_TABLET | ORAL | Status: AC
  Administered 2015-12-22 (×2): 40 meq via ORAL
  Filled 2015-12-22 (×2): qty 2

## 2015-12-22 MED ORDER — POTASSIUM CHLORIDE CRYS ER 20 MEQ PO TBCR: 40.0000 meq | EXTENDED_RELEASE_TABLET | Freq: Every day | ORAL | Status: DC

## 2015-12-22 MED ORDER — HYDROXYUREA 500 MG PO CAPS: 1000.0000 mg | ORAL_CAPSULE | Freq: Every day | ORAL | Status: DC

## 2015-12-22 NOTE — Discharge Summary (Signed)
[name] MRN: [id number] DOB/AGE: Nov 23, 1980 35 y.o.  Admit date: 12/15/2015 Discharge date: 12/22/2015  Primary Care Physician:  [name], MD   Discharge Diagnoses:   Patient Active Problem List   Diagnosis Date Noted  . Cor pulmonale, chronic ([place]) 08/25/2014    Priority: High  . Pulmonary HTN 06/18/2013    Priority: High  . Functional asplenia     Priority: High  . Anemia of chronic disease 06/25/2014    Priority: Medium  . Hb-SS disease with crisis ([place]) 01/22/2014    Priority: Medium  . Chronic anticoagulation 08/22/2013    Priority: Medium  . Avascular necrosis ([place])     Priority: Low  . Abdominal pain   . Colitis 12/15/2015  . Slurred speech 11/10/2015  . Aphasia   . Ischemic stroke ([place])   . Sickle cell crisis ([place]) 10/27/2015  . Shortness of breath   . 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 Hypoxia   . Chronic anemia   . Sickle cell anemia ([place]) 06/25/2014  . PAH (pulmonary artery hypertension) 03/18/2014  . Paralytic strabismus, external ophthalmoplegia   . Chronic pain syndrome 12/12/2013  . Essential hypertension 08/22/2013  . Vitamin D deficiency 02/13/2013  . Hx of pulmonary embolus 06/29/2012  . Hemochromatosis 12/14/2011    DISCHARGE MEDICATION:   Medication List    TAKE these medications   ambrisentan 5 MG tablet Commonly known as:  LETAIRIS Take 5 mg by mouth at bedtime.   aspirin 81 MG chewable tablet Chew 1 tablet (81 mg total) by mouth daily.   cholecalciferol 1000 units tablet Commonly known as:  VITAMIN D Take 2,000 Units by mouth daily.   folic acid 1 MG tablet Commonly known as:  FOLVITE Take 1 mg by mouth daily.   gabapentin 300 MG capsule Commonly known as:  NEURONTIN Take 1 capsule (300 mg total) by mouth 3 (three) times daily.   HYDROmorphone 4 MG tablet Commonly known as:  DILAUDID Take 4 mg by mouth every 4 (four) hours as needed for moderate pain or severe pain.   hydroxyurea 500 MG  capsule Commonly known as:  HYDREA Take 2 capsules (1,000 mg total) by mouth daily. May take with food to minimize GI side effects. Hold Hydrea until evaluated by Primary Provider or Hematologist. What changed:  additional instructions   JADENU 360 MG Tabs Generic drug:  Deferasirox Take 1,080 mg by mouth daily.   lisinopril 5 MG tablet Commonly known as:  PRINIVIL,ZESTRIL Take 1 tablet (5 mg total) by mouth daily.   morphine 30 MG 12 hr tablet Commonly known as:  MS CONTIN Take 1 tablet (30 mg total) by mouth every 12 (twelve) hours.   potassium chloride SA 20 MEQ tablet Commonly known as:  K-DUR,KLOR-CON Take 2 tablets (40 mEq total) by mouth daily. Start taking on:  12/23/2015   rivaroxaban 20 MG Tabs tablet Commonly known as:  XARELTO Take 20 mg by mouth at bedtime.   torsemide 20 MG tablet Commonly known as:  DEMADEX Take 20 mg by mouth daily as needed (for weight gain greater than 3lbs.).   zolpidem 10 MG tablet Commonly known as:  AMBIEN Take 1 tablet (10 mg total) by mouth at bedtime as needed for sleep.         Consults:    SIGNIFICANT DIAGNOSTIC STUDIES:  Dg Chest 2 View  Result Date: 12/21/2015 CLINICAL DATA:  Hypoxia with shortness of breath history of sickle cell EXAM: CHEST  2 VIEW  COMPARISON:  11/27/2015 FINDINGS: A left-sided central venous port tip overlies the distal SVC. Moderate-to-marked cardiomegaly is similar compared to the prior study. No effusion. No consolidation. No pneumothorax. Small amount of atelectasis at the left base. IMPRESSION: Cardiomegaly without acute infiltrate or overt pulmonary edema. Electronically Signed   By: Donavan Foil M.D.   On: 12/21/2015 15:36   Dg Chest 2 View  Result Date: 11/27/2015 CLINICAL DATA:  Sickle cell disease.  Chest pain for 2-3 days. EXAM: CHEST  2 VIEW COMPARISON:  Chest radiograph 11/23/2015 FINDINGS: There is a left chest wall Port-A-Cath with the tip in the proximal right atrium, unchanged.  Unchanged cardiomegaly. No pulmonary edema. No focal airspace consolidation. No pleural effusion or pneumothorax. Unchanged hazy opacities and nodular density of the right mid lung. IMPRESSION: Unchanged cardiomegaly without overt pulmonary edema. Unchanged hazy opacities in the right mid lung. Electronically Signed   By: Ulyses Jarred M.D.   On: 11/27/2015 23:05   Dg Chest 2 View  Result Date: 11/23/2015 CLINICAL DATA:  Bilateral side pain for 1 week with hypoxia. EXAM: CHEST  2 VIEW COMPARISON:  11/22/2015 FINDINGS: The lungs are clear wiithout focal pneumonia, edema, pneumothorax or pleural effusion. The cardio pericardial silhouette is enlarged. There is pulmonary vascular congestion without overt pulmonary edema. Nodular areas in the peripheral aspect of the right mid lung were seen on CT scan from 2 weeks ago. Left Port-A-Cath tip projects at the SVC/RA junction. Telemetry leads overlie the chest. IMPRESSION: Cardiomegaly with pulmonary vascular congestion. Nodular density right mid lung better seen on recent CT scan. Electronically Signed   By: Misty Stanley M.D.   On: 11/23/2015 10:34   Dg Abd 1 View  Result Date: 12/16/2015 CLINICAL DATA:  Acute abdominal pain.  Nausea and vomiting. EXAM: ABDOMEN - 1 VIEW COMPARISON:  CT yesterday FINDINGS: Enteric contrast from CT yesterday in the distal transverse and descending colon. Small to moderate stool burden. No bowel dilatation to suggest obstruction. No evidence of free air on portable supine view. Cholecystectomy clips in the right upper quadrant of the abdomen. Right hip arthroplasty in place. IMPRESSION: No evidence of bowel obstruction or free air. Enteric contrast from CT yesterday in the distal transverse and descending colon. Small to moderate stool burden. Electronically Signed   By: Jeb Levering M.D.   On: 12/16/2015 17:35   Ct Abdomen Pelvis W Contrast  Result Date: 12/15/2015 CLINICAL DATA:  Upper abdominal pain, nausea, vomiting  since 7 a.m. EXAM: CT ABDOMEN AND PELVIS WITH CONTRAST TECHNIQUE: Multidetector CT imaging of the abdomen and pelvis was performed using the standard protocol following bolus administration of intravenous contrast. CONTRAST:  150mL ISOVUE-300 IOPAMIDOL (ISOVUE-300) INJECTION 61% COMPARISON:  06/03/2015 FINDINGS: Lower chest: Cardiomegaly.  Mild lingular and bibasilar atelectasis. Hepatobiliary: No focal liver abnormality is seen. Status post cholecystectomy. No biliary dilatation. Pancreas: Unremarkable. No pancreatic ductal dilatation or surrounding inflammatory changes. Spleen: Extensive chronic splenic infarction with calcification. Adrenals/Urinary Tract: Adrenal glands are unremarkable. Kidneys are normal, without renal calculi, focal lesion, or hydronephrosis. Bladder is unremarkable. Stomach/Bowel: Bowel wall thickening of the ascending and transverse colon concerning for colitis. No pneumatosis, pneumoperitoneum or portal venous gas. No bowel obstruction. Vascular/Lymphatic: Normal caliber abdominal aorta with atherosclerosis. No lymphadenopathy. Reproductive: Prostate is unremarkable. Other: No fluid collection or hematoma.  No abdominal wall hernia. Musculoskeletal: No acute osseous abnormality. No aggressive is lytic or sclerotic osseous lesion. Mild osteosclerosis consistent with sickle cell anemia. Right total hip arthroplasty without failure complication. Mild subchondral sclerosis  in the left femoral head concerning for avascular necrosis. IMPRESSION: 1. Bowel wall thickening of the ascending and transverse colon concerning for colitis which may be secondary to an infectious, inflammatory or ischemic etiology given the patient's history of sickle cell anemia. 2. Mild subchondral sclerosis in the left femoral head concerning for avascular necrosis. 3. Stable cardiomegaly. Electronically Signed   By: Kathreen Devoid   On: 12/15/2015 12:57       Recent Results (from the past 240 hour(s))   Gastrointestinal Panel by PCR , Stool     Status: None   Collection Time: 12/18/15 11:07 PM  Result Value Ref Range Status   Campylobacter species NOT DETECTED NOT DETECTED Final   Plesimonas shigelloides NOT DETECTED NOT DETECTED Final   Salmonella species NOT DETECTED NOT DETECTED Final   Yersinia enterocolitica NOT DETECTED NOT DETECTED Final   Vibrio species NOT DETECTED NOT DETECTED Final   Vibrio cholerae NOT DETECTED NOT DETECTED Final   Enteroaggregative E coli (EAEC) NOT DETECTED NOT DETECTED Final   Enteropathogenic E coli (EPEC) NOT DETECTED NOT DETECTED Final   Enterotoxigenic E coli (ETEC) NOT DETECTED NOT DETECTED Final   Shiga like toxin producing E coli (STEC) NOT DETECTED NOT DETECTED Final   Shigella/Enteroinvasive E coli (EIEC) NOT DETECTED NOT DETECTED Final   Cryptosporidium NOT DETECTED NOT DETECTED Final   Cyclospora cayetanensis NOT DETECTED NOT DETECTED Final   Entamoeba histolytica NOT DETECTED NOT DETECTED Final   Giardia lamblia NOT DETECTED NOT DETECTED Final   Adenovirus F40/41 NOT DETECTED NOT DETECTED Final   Astrovirus NOT DETECTED NOT DETECTED Final   Norovirus GI/GII NOT DETECTED NOT DETECTED Final   Rotavirus A NOT DETECTED NOT DETECTED Final   Sapovirus (I, II, IV, and V) NOT DETECTED NOT DETECTED Final    BRIEF ADMITTING H & P: This is a patient with Hb SS who presents with c/o abdominal pain which began this morning. Pt describes pain as dull and occasionally sharp and non-radiating. He reports that he took his pain medications and had no relief. He reports that he had a BM last night which was formed and denies any diarrhea. He has not had any emesis but has not ate anything in particular today. He states that the pain is unlike that of the usual crisis and also states that he has been compliant with taking Xarelto. A CT of the abdomen was performed in the ED and showed an area of bowel thickening in the region of the transverse and ascending colon  consistent with possible colitis.    Pt denies any fever, chills, Vomiting or diarrhea. In the ED he received 3 doses of Dilaudid in the ED and a dose of ciprofloxacin and Flagyl. Of note, patient was scheduled for RBC pheresis at [facility] yesterday as reported during his last admission and did not keep the appointment stating that he forgot.    Hospital Course:  Present on Admission: . Colitis Pt was admitted with abdominal pain and was found to have colitis on CT of the abdomen. However, his abdominal pain was not in the distribution of the CT findings.He was initially started on Ciprofloxacin and Flagyl. However, he had no diarrhea or any signs of an infective colitis.  It was initially assumed that this was likely ischemic colitis associated with his chronic ischemia and reperfusion associated with sickle cell disease. The pain was being treated as Sickle Cell Disease with IV Dilaudid, Toradol and IVF. However he developed acute worsening of the abdominal  pain and surgery was consulted. The made patient NPO and changed antibiotics to Vancomycin and Zosyn. They felt that surgery was not indicated and recommended endoscopic evaluation by GI. GI was consulted and subsequently discontinued the antibiotics and they also felt that this was ischemic pain and recommended treatment of the underlying condition. Additionally GI did not feel that endoscopic evaluation was indicated. Pt improved clinically with control of pain and diet was advance with good tolerance. With regard to his anemia of chronic disease, Hb was at baseline but patient was symptomatic with tachycardia and Hypoxia. He was transfused 1 unit of RBC and at the time of discharge he was at baseline with Oxygen requirement and tachycardia had improved. He is on chronic anticoagulation which was uninterrupted during his hospital course and is continued on Xarelto at discharge.  He is otherwise stable at time of discharge. Pt states that he is not  adherent to Oxygen use at home because he does not have a concentrator or easily portable tank. CM was consulted and they will contact patient to arrange for DME.    Disposition and Follow-up: Pt discharged home in stable condition.  Discharge Instructions    Activity as tolerated - No restrictions    Complete by:  As directed    Diet general    Complete by:  As directed       DISCHARGE EXAM:  General: Alert, awake, oriented x3, in mild distress.  HEENT: [place]/AT PEERL, EOMI, anicteric Neck: Trachea midline, no masses, no thyromegal,y no JVD, no carotid bruit OROPHARYNX: Moist, No exudate/ erythema/lesions.  Heart: Regular rate and rhythm, without murmurs, rubs, gallops or S3. PMI non-displaced. Exam reveals no decreased pulses. Pulmonary/Chest: Normal effort. Breath sounds normal. No. Apnea. Clear to auscultation,no stridor,  no wheezing and no rhonchi noted. No respiratory distress and no tenderness noted. Abdomen: Soft, nontender, nondistended, normal bowel sounds, no masses no hepatosplenomegaly noted. No fluid wave and no ascites. There is no guarding or rebound. Neuro: Alert and oriented to person, place and time. Normal motor skills, Displays no atrophy or tremors and exhibits normal muscle tone.  No focal neurological deficits noted cranial nerves II through XII grossly intact. No sensory deficit noted.  Strength at baseline in bilateral upper and lower extremities. Gait normal. Musculoskeletal: No warm swelling or erythema around joints, no spinal tenderness noted. Psychiatric: Patient alert and oriented x3, good insight and cognition, good recent to remote recall. Mood, affect and judgement normal Lymph node survey: No cervical axillary or inguinal lymphadenopathy noted. Skin: Skin is warm and dry. No bruising, no ecchymosis and no rash noted. Pt is not diaphoretic. No erythema. No pallor    Blood pressure 107/68, pulse 84, temperature 99 F (37.2 C), temperature source Oral,  resp. rate 20, height 6' (1.829 m), weight 83.5 kg (184 lb 1.4 oz), SpO2 99 % on 2 L/min.   Recent Labs  12/22/15 0830  NA 137  K 3.3*  CL 108  CO2 24  GLUCOSE 116*  BUN 7  CREATININE 0.76  CALCIUM 8.7*   No results for input(s): AST, ALT, ALKPHOS, BILITOT, PROT, ALBUMIN in the last 72 hours. No results for input(s): LIPASE, AMYLASE in the last 72 hours.  Recent Labs  12/20/15 0503 12/22/15 0830  WBC 14.0* 15.4*  NEUTROABS 9.1* 10.4*  HGB 6.2* 6.8*  HCT 18.3* 20.1*  MCV 88.8 87.8  PLT 445* 486*     Total time spent including face to face and decision making was greater than 30  minutes  Signed: [name],[name] 12/22/2015, 12:40 PM

## 2015-12-27 ENCOUNTER — Emergency Department (HOSPITAL_COMMUNITY): Payer: Medicare Other

## 2015-12-27 ENCOUNTER — Emergency Department (HOSPITAL_COMMUNITY)
Admission: EM | Admit: 2015-12-27 | Discharge: 2015-12-27 | Disposition: A | Discharge status: Home / Self Care | Payer: Medicare Other | Attending: Emergency Medicine | Admitting: Emergency Medicine

## 2015-12-27 ENCOUNTER — Encounter (HOSPITAL_COMMUNITY): Payer: Self-pay | Admitting: *Deleted

## 2015-12-27 DIAGNOSIS — Z7982 Long term (current) use of aspirin: Secondary | ICD-10-CM | POA: Insufficient documentation

## 2015-12-27 DIAGNOSIS — Z7901 Long term (current) use of anticoagulants: Secondary | ICD-10-CM | POA: Insufficient documentation

## 2015-12-27 DIAGNOSIS — Z96641 Presence of right artificial hip joint: Secondary | ICD-10-CM | POA: Insufficient documentation

## 2015-12-27 DIAGNOSIS — Z8673 Personal history of transient ischemic attack (TIA), and cerebral infarction without residual deficits: Secondary | ICD-10-CM | POA: Diagnosis not present

## 2015-12-27 DIAGNOSIS — D5700 Hb-SS disease with crisis, unspecified: Secondary | ICD-10-CM | POA: Diagnosis not present

## 2015-12-27 DIAGNOSIS — Z79899 Other long term (current) drug therapy: Secondary | ICD-10-CM | POA: Insufficient documentation

## 2015-12-27 DIAGNOSIS — Z87891 Personal history of nicotine dependence: Secondary | ICD-10-CM | POA: Insufficient documentation

## 2015-12-27 DIAGNOSIS — I10 Essential (primary) hypertension: Secondary | ICD-10-CM | POA: Diagnosis not present

## 2015-12-27 DIAGNOSIS — R079 Chest pain, unspecified: Secondary | ICD-10-CM | POA: Diagnosis not present

## 2015-12-27 LAB — CBC WITH DIFFERENTIAL/PLATELET
BASOS ABS: 0.1 10*3/uL (ref 0.0–0.1)
BASOS PCT: 1 %
EOS ABS: 0.4 10*3/uL (ref 0.0–0.7)
EOS PCT: 3 %
HCT: 25.1 % — ABNORMAL LOW (ref 39.0–52.0)
Hemoglobin: 8.3 g/dL — ABNORMAL LOW (ref 13.0–17.0)
Lymphocytes Relative: 16 %
Lymphs Abs: 2.4 10*3/uL (ref 0.7–4.0)
MCH: 29.5 pg (ref 26.0–34.0)
MCHC: 33.1 g/dL (ref 30.0–36.0)
MCV: 89.3 fL (ref 78.0–100.0)
Monocytes Absolute: 1.7 10*3/uL — ABNORMAL HIGH (ref 0.1–1.0)
Monocytes Relative: 12 %
NEUTROS PCT: 69 %
Neutro Abs: 9.9 10*3/uL — ABNORMAL HIGH (ref 1.7–7.7)
PLATELETS: 678 10*3/uL — AB (ref 150–400)
RBC: 2.81 MIL/uL — AB (ref 4.22–5.81)
RDW: 21.7 % — AB (ref 11.5–15.5)
WBC: 14.4 10*3/uL — AB (ref 4.0–10.5)

## 2015-12-27 LAB — COMPREHENSIVE METABOLIC PANEL
ALBUMIN: 4.3 g/dL (ref 3.5–5.0)
ALT: 14 U/L — ABNORMAL LOW (ref 17–63)
ANION GAP: 7 (ref 5–15)
AST: 20 U/L (ref 15–41)
Alkaline Phosphatase: 73 U/L (ref 38–126)
BILIRUBIN TOTAL: 4.9 mg/dL — AB (ref 0.3–1.2)
BUN: 9 mg/dL (ref 6–20)
CHLORIDE: 107 mmol/L (ref 101–111)
CO2: 24 mmol/L (ref 22–32)
Calcium: 9 mg/dL (ref 8.9–10.3)
Creatinine, Ser: 0.85 mg/dL (ref 0.61–1.24)
GFR calc Af Amer: >60 mL/min (ref >60)
GFR calc non Af Amer: >60 mL/min (ref >60)
GLUCOSE: 113 mg/dL — AB (ref 65–99)
POTASSIUM: 3.5 mmol/L (ref 3.5–5.1)
SODIUM: 138 mmol/L (ref 135–145)
TOTAL PROTEIN: 7.7 g/dL (ref 6.5–8.1)

## 2015-12-27 LAB — RETICULOCYTES
RBC.: 2.81 MIL/uL — ABNORMAL LOW (ref 4.22–5.81)
RETIC COUNT ABSOLUTE: 323.2 10*3/uL — AB (ref 19.0–186.0)
Retic Ct Pct: 11.5 % — ABNORMAL HIGH (ref 0.4–3.1)

## 2015-12-27 MED ORDER — HYDROMORPHONE HCL 2 MG/ML IJ SOLN: 2.0000 mg | INTRAMUSCULAR | Status: AC

## 2015-12-27 MED ORDER — ONDANSETRON HCL 4 MG/2ML IJ SOLN
4.0000 mg | INTRAMUSCULAR | Status: DC | PRN
  Administered 2015-12-27: 4 mg via INTRAVENOUS
  Filled 2015-12-27: qty 2

## 2015-12-27 MED ORDER — HYDROMORPHONE HCL 2 MG/ML IJ SOLN
2.0000 mg | INTRAMUSCULAR | Status: AC
  Administered 2015-12-27: 2 mg via INTRAVENOUS
  Filled 2015-12-27: qty 1

## 2015-12-27 MED ORDER — HEPARIN SOD (PORK) LOCK FLUSH 100 UNIT/ML IV SOLN
500.0000 [IU] | Freq: Once | INTRAVENOUS | Status: AC
  Administered 2015-12-27: 500 [IU]
  Filled 2015-12-27: qty 5

## 2015-12-27 MED ORDER — HYDROMORPHONE HCL 2 MG/ML IJ SOLN: 2.0000 mg | INTRAMUSCULAR | Status: DC

## 2015-12-27 MED ORDER — KETOROLAC TROMETHAMINE 30 MG/ML IJ SOLN
30.0000 mg | INTRAMUSCULAR | Status: AC
  Administered 2015-12-27: 30 mg via INTRAVENOUS
  Filled 2015-12-27: qty 1

## 2015-12-27 MED ORDER — DEXTROSE 5 % IV BOLUS
1000.0000 mL | Freq: Once | INTRAVENOUS | Status: AC
  Administered 2015-12-27: 1000 mL via INTRAVENOUS

## 2015-12-27 MED ORDER — DIPHENHYDRAMINE HCL 25 MG PO CAPS
25.0000 mg | ORAL_CAPSULE | ORAL | Status: DC | PRN
  Administered 2015-12-27: 50 mg via ORAL
  Filled 2015-12-27: qty 2

## 2015-12-27 NOTE — ED Notes (Signed)
Report given to Rudi Heap. RN

## 2015-12-27 NOTE — ED Notes (Signed)
Resting in bed with eyes closed no reaction to medication noted call light in reach no respiratory or acute distress noted.

## 2015-12-27 NOTE — ED Provider Notes (Signed)
[facility] DEPT Provider Note   CSN: [id number] Arrival date & time: 12/27/15  0532     History   Chief Complaint Chief Complaint  Patient presents with  . Sickle Cell Pain Crisis    HPI [name] is a 35 y.o. male.  The history is provided by the patient and medical records. No language interpreter was used.  Sickle Cell Pain Crisis  Associated symptoms: no chest pain, no congestion, no cough, no fever, no headaches, no nausea, no shortness of breath and no vomiting     [name] is a 35 y.o. male  with a PMH of sickle cell anemia, prior stroke on xarelto who presents to the Emergency Department complaining of bilateral rib cage pain which radiates around to the back which began at 4am this morning and has gradually worsened. Patient states pain today is c/w his usual sickle cell crisis. He states that he tried his home dilaudid and MS contin with no relief. He denies abdominal pain, chest pain, shortness of breath, cough, congestion or any other associated symptoms.    Past Medical History:  Diagnosis Date  . Acute chest syndrome ([place]) 06/18/2013  . Acute embolism and thrombosis of right internal jugular vein ([place])   . Alcohol consumption of one to four drinks per day   . Avascular necrosis (HCC)    Right Hip  . Blood transfusion   . Chronic anticoagulation   . Demand ischemia ([place]) 01/02/2014  . Former smoker   . Functional asplenia   . Hb-SS disease with crisis ([place])   . History of Clostridium difficile infection   . History of pulmonary embolus (PE)   . Hypertension   . Hypokalemia   . Leukocytosis    Chronic  . Mood disorder ([place])   . Noncompliance with medication regimen   . Oxygen deficiency   . Pulmonary hypertension   . Second hand tobacco smoke exposure   . Sickle cell anemia (HCC)   . Sickle-cell crisis with associated acute chest syndrome ([place]) 05/13/2013  . Stroke ([place])   . Thrombocytosis (HCC)    Chronic  . Uses marijuana      Patient Active Problem List   Diagnosis Date Noted  . Abdominal pain   . Colitis 12/15/2015  . Slurred speech 11/10/2015  . Pulmonary emboli ([place]) 11/10/2015  . Aphasia   . Acute chest syndrome ([place])   . Ischemic stroke ([place])   . Sickle cell crisis ([place]) 10/27/2015  . Sickle cell pain crisis ([place]) 09/18/2015  . Sickle cell anemia with crisis ([place]) 09/04/2015  . Shortness of breath   . Pulmonary embolism without acute cor pulmonale ([place])   . Fever   . Sickle cell anemia with pain ([place]) 05/07/2015  . Acute kidney injury ([place])   . Hypoxia   . Anemia 01/31/2015  . Chronic anemia   . Hb-SS disease without crisis ([place]) 10/07/2014  . Acute on chronic respiratory failure with hypoxia ([place])   . PVC's (premature ventricular contractions) 09/10/2014  . Abnormal EKG 09/10/2014  . Chronic atrial fibrillation ([place]) 09/09/2014  . Cor pulmonale, chronic ([place]) 08/25/2014  . Sickle cell anemia ([place]) 06/25/2014  . Anemia of chronic disease 06/25/2014  . PAH (pulmonary artery hypertension) 03/18/2014  . Hb-SS disease with crisis ([place]) 01/22/2014  . Pulmonary embolus ([place]) 01/01/2014  . Paralytic strabismus, external ophthalmoplegia   . Chronic pain syndrome 12/12/2013  . Elevated troponin 11/26/2013  . Chronic anticoagulation 08/22/2013  . Essential hypertension 08/22/2013  .  Pulmonary HTN 06/18/2013  . Functional asplenia   . Vitamin D deficiency 02/13/2013  . Hx of pulmonary embolus 06/29/2012  . Hemochromatosis 12/14/2011  . Avascular necrosis [facility])     Past Surgical History:  Procedure Laterality Date  . CHOLECYSTECTOMY     01/2008  . Excision of left periauricular cyst     10/2009  . Excision of right ear lobe cyst with primary closur     11/2007  . Porta cath placement    . Porta cath removal    . PORTACATH PLACEMENT  01/05/2012   Procedure: INSERTION PORT-A-CATH;  Surgeon: Odis Hollingshead, MD;  Location: [facility];  Service: General;  Laterality: N/A;  ultrasound guiced  port a cath insertion with fluoroscopy  . Right hip replacement     08/2006  . UMBILICAL HERNIA REPAIR     01/2008       Home Medications    Prior to Admission medications   Medication Sig Start Date End Date Taking? Authorizing Provider  ambrisentan (LETAIRIS) 5 MG tablet Take 5 mg by mouth at bedtime.   Yes Historical Provider, MD  aspirin 81 MG chewable tablet Chew 1 tablet (81 mg total) by mouth daily. 07/23/14  Yes [name], MD  cholecalciferol (VITAMIN D) 1000 units tablet Take 2,000 Units by mouth daily.   Yes Historical Provider, MD  Deferasirox (JADENU) 360 MG TABS Take 1,080 mg by mouth daily.    Yes Historical Provider, MD  folic acid (FOLVITE) 1 MG tablet Take 1 mg by mouth daily.   Yes Historical Provider, MD  gabapentin (NEURONTIN) 300 MG capsule Take 1 capsule (300 mg total) by mouth 3 (three) times daily. 06/01/15  Yes Tresa Garter, MD  HYDROmorphone (DILAUDID) 4 MG tablet Take 4 mg by mouth every 4 (four) hours as needed for moderate pain or severe pain.   Yes Historical Provider, MD  hydroxyurea (HYDREA) 500 MG capsule Take 2 capsules (1,000 mg total) by mouth daily. May take with food to minimize GI side effects. Hold Hydrea until evaluated by Primary Provider or Hematologist. 12/22/15  Yes [name], MD  lisinopril (PRINIVIL,ZESTRIL) 5 MG tablet Take 1 tablet (5 mg total) by mouth daily. 08/04/15  Yes [name], MD  morphine (MS CONTIN) 30 MG 12 hr tablet Take 1 tablet (30 mg total) by mouth every 12 (twelve) hours. 12/06/15 01/05/16 Yes Olugbemiga Essie Christine, MD  potassium chloride SA (K-DUR,KLOR-CON) 20 MEQ tablet Take 2 tablets (40 mEq total) by mouth daily. 12/23/15  Yes [name], MD  rivaroxaban (XARELTO) 20 MG TABS tablet Take 20 mg by mouth at bedtime.    Yes Historical Provider, MD  torsemide (DEMADEX) 20 MG tablet Take 20 mg by mouth daily as needed (for weight gain greater than 3lbs.).    Yes Historical Provider, MD   zolpidem (AMBIEN) 10 MG tablet Take 1 tablet (10 mg total) by mouth at bedtime as needed for sleep. 12/06/15 01/05/16 Yes Tresa Garter, MD    Family History Family History  Problem Relation Age of Onset  . Sickle cell trait Mother   . Depression Mother   . Diabetes Mother   . Sickle cell trait Father   . Sickle cell trait Brother     Social History Social History  Substance Use Topics  . Smoking status: Former Smoker    Packs/day: 0.50    Years: 10.00    Types: Cigarettes    Quit date: 05/29/2011  .  Smokeless tobacco: Never Used  . Alcohol use No     Allergies   Patient has no known allergies.   Review of Systems Review of Systems  Constitutional: Negative for fever.  HENT: Negative for congestion.   Eyes: Negative for visual disturbance.  Respiratory: Negative for cough and shortness of breath.   Cardiovascular: Negative for chest pain and palpitations.  Gastrointestinal: Negative for abdominal pain, nausea and vomiting.  Genitourinary: Negative for dysuria.  Musculoskeletal: Positive for arthralgias. Negative for joint swelling.  Skin: Negative for color change and rash.  Neurological: Negative for headaches.     Physical Exam Updated Vital Signs BP 106/79 (BP Location: Right Arm)   Pulse 74   Temp 97.9 F (36.6 C) (Oral)   Resp 16   Ht 6' (1.829 m)   Wt 83.5 kg   SpO2 (!) 9%   BMI 24.95 kg/m   Physical Exam  Constitutional: He is oriented to person, place, and time. He appears well-developed and well-nourished. No distress.  HENT:  Head: Normocephalic and atraumatic.  Cardiovascular: Normal rate, regular rhythm and normal heart sounds.   No murmur heard. Pulmonary/Chest: Effort normal and breath sounds normal. No respiratory distress. He has no wheezes. He has no rales. He exhibits no tenderness.  Abdominal: Soft. Bowel sounds are normal. He exhibits no distension. There is no tenderness.  Musculoskeletal: Normal range of motion.  No  reproducible tenderness of rib cage or back.   Neurological: He is alert and oriented to person, place, and time.  Skin: Skin is warm and dry.  Nursing note and vitals reviewed.    ED Treatments / Results  Labs (all labs ordered are listed, but only abnormal results are displayed) Labs Reviewed  CBC WITH DIFFERENTIAL/PLATELET - Abnormal; Notable for the following:       Result Value   WBC 14.4 (*)    RBC 2.81 (*)    Hemoglobin 8.3 (*)    HCT 25.1 (*)    RDW 21.7 (*)    Platelets 678 (*)    Neutro Abs 9.9 (*)    Monocytes Absolute 1.7 (*)    All other components within normal limits  COMPREHENSIVE METABOLIC PANEL - Abnormal; Notable for the following:    Glucose, Bld 113 (*)    ALT 14 (*)    Total Bilirubin 4.9 (*)    All other components within normal limits  RETICULOCYTES - Abnormal; Notable for the following:    Retic Ct Pct 11.5 (*)    RBC. 2.81 (*)    Retic Count, Manual 323.2 (*)    All other components within normal limits    EKG  EKG Interpretation None       Radiology Dg Chest 2 View  Result Date: 12/27/2015 CLINICAL DATA:  Bilateral rib pain since 3 p.m. today. History of sickle cell. History of acute chest syndrome. EXAM: CHEST  2 VIEW COMPARISON:  12/21/2015 FINDINGS: Power port type central venous catheter with tip over the cavoatrial junction region. No pneumothorax. No change in position since prior study. Cardiac enlargement without vascular congestion. No focal consolidation or edema. No blunting of costophrenic angles. No pneumothorax. Mediastinal contours appear intact. IMPRESSION: Cardiac enlargement. No evidence of active pulmonary disease. No focal consolidation. Electronically Signed   By: Lucienne Capers M.D.   On: 12/27/2015 06:47    Procedures Procedures (including critical care time)  Medications Ordered in ED Medications  diphenhydrAMINE (BENADRYL) capsule 25-50 mg (50 mg Oral Given 12/27/15 0623)  ondansetron (ZOFRAN)  injection 4 mg  (4 mg Intravenous Given 12/27/15 0614)  dextrose 5 % bolus 1,000 mL (0 mLs Intravenous Stopped 12/27/15 0713)  ketorolac (TORADOL) 30 MG/ML injection 30 mg (30 mg Intravenous Given 12/27/15 [id number])  HYDROmorphone (DILAUDID) injection 2 mg (2 mg Intravenous Given 12/27/15 [id number])    Or  HYDROmorphone (DILAUDID) injection 2 mg ( Subcutaneous See Alternative 12/27/15 [id number])  HYDROmorphone (DILAUDID) injection 2 mg (2 mg Intravenous Given 12/27/15 0659)    Or  HYDROmorphone (DILAUDID) injection 2 mg ( Subcutaneous See Alternative 12/27/15 0659)  HYDROmorphone (DILAUDID) injection 2 mg (2 mg Intravenous Given 12/27/15 0744)    Or  HYDROmorphone (DILAUDID) injection 2 mg ( Subcutaneous See Alternative 12/27/15 0744)     Initial Impression / Assessment and Plan / ED Course  I have reviewed the triage vital signs and the nursing notes.  Pertinent labs & imaging results that were available during my care of the patient were reviewed by me and considered in my medical decision making (see chart for details).  Clinical Course    [name] is a 35 y.o. male who presents to ED for bilateral rib cage pain c/w typical sickle cell crisis. On xarelto and taking medication as directed with no missed dose. On exam, patient is afebrile, normal heart rate with clear lung exam. Labs, CXR, pain meds and continue to monitor.  7:31 AM - Patient re-evaluated and feels improved. Pain down from 8/10 to 5/10. No respiratory complaints. Will give one more dose of pain meds then dispo.   8:17 AM - Patient reevaluated and feels well enough to go home. Pain down to 3/10. Agrees to call sickle cell clinic tomorrow morning and schedule follow-up appointment. Increase hydration. Reasons to return to the ER were discussed and all questions answered.  Final Clinical Impressions(s) / ED Diagnoses   Final diagnoses:  Sickle cell pain crisis [facility])    New Prescriptions New Prescriptions   No medications on file      [place], PA-C 12/27/15 [place], DO 12/29/15 1453

## 2015-12-27 NOTE — Discharge Instructions (Signed)
Please call the sickle cell clinic in the morning to schedule a follow-up appointment for discussion of today's visit. Increase hydration. Return to the ER for fevers, shortness of breath, cough, any new or worsening symptoms, any additional concerns.

## 2015-12-27 NOTE — ED Triage Notes (Signed)
Patient is alert and oriented x4.  He is complaining of sickle cell pain in his ribs.  Currently he rates his pain 8 of 10.

## 2015-12-29 ENCOUNTER — Encounter (HOSPITAL_COMMUNITY): Payer: Self-pay | Admitting: *Deleted

## 2015-12-29 ENCOUNTER — Other Ambulatory Visit: Payer: Self-pay | Admitting: Internal Medicine

## 2015-12-29 ENCOUNTER — Telehealth (HOSPITAL_COMMUNITY): Payer: Self-pay | Admitting: *Deleted

## 2015-12-29 ENCOUNTER — Non-Acute Institutional Stay (HOSPITAL_COMMUNITY)
Admission: AD | Admit: 2015-12-29 | Discharge: 2015-12-29 | Disposition: A | Discharge status: Home / Self Care | Payer: Medicare Other | Source: Ambulatory Visit | Attending: Internal Medicine | Admitting: Internal Medicine

## 2015-12-29 DIAGNOSIS — Z7982 Long term (current) use of aspirin: Secondary | ICD-10-CM | POA: Diagnosis not present

## 2015-12-29 DIAGNOSIS — Z79899 Other long term (current) drug therapy: Secondary | ICD-10-CM | POA: Insufficient documentation

## 2015-12-29 DIAGNOSIS — Z8673 Personal history of transient ischemic attack (TIA), and cerebral infarction without residual deficits: Secondary | ICD-10-CM | POA: Insufficient documentation

## 2015-12-29 DIAGNOSIS — Z96641 Presence of right artificial hip joint: Secondary | ICD-10-CM | POA: Insufficient documentation

## 2015-12-29 DIAGNOSIS — Z87891 Personal history of nicotine dependence: Secondary | ICD-10-CM | POA: Insufficient documentation

## 2015-12-29 DIAGNOSIS — Z9981 Dependence on supplemental oxygen: Secondary | ICD-10-CM | POA: Diagnosis not present

## 2015-12-29 DIAGNOSIS — Z7901 Long term (current) use of anticoagulants: Secondary | ICD-10-CM | POA: Diagnosis not present

## 2015-12-29 DIAGNOSIS — Z9114 Patient's other noncompliance with medication regimen: Secondary | ICD-10-CM | POA: Diagnosis not present

## 2015-12-29 DIAGNOSIS — Z86711 Personal history of pulmonary embolism: Secondary | ICD-10-CM | POA: Insufficient documentation

## 2015-12-29 DIAGNOSIS — F5101 Primary insomnia: Secondary | ICD-10-CM

## 2015-12-29 DIAGNOSIS — Z86718 Personal history of other venous thrombosis and embolism: Secondary | ICD-10-CM | POA: Diagnosis not present

## 2015-12-29 DIAGNOSIS — D5700 Hb-SS disease with crisis, unspecified: Secondary | ICD-10-CM | POA: Diagnosis not present

## 2015-12-29 DIAGNOSIS — I272 Pulmonary hypertension, unspecified: Secondary | ICD-10-CM | POA: Insufficient documentation

## 2015-12-29 DIAGNOSIS — I10 Essential (primary) hypertension: Secondary | ICD-10-CM | POA: Diagnosis not present

## 2015-12-29 DIAGNOSIS — Z9119 Patient's noncompliance with other medical treatment and regimen: Secondary | ICD-10-CM | POA: Diagnosis not present

## 2015-12-29 MED ORDER — DIPHENHYDRAMINE HCL 25 MG PO CAPS: 25.0000 mg | ORAL_CAPSULE | ORAL | Status: DC | PRN

## 2015-12-29 MED ORDER — RIVAROXABAN 20 MG PO TABS: 20.0000 mg | ORAL_TABLET | Freq: Every day | ORAL | 3 refills | Status: DC

## 2015-12-29 MED ORDER — HYDROMORPHONE 1 MG/ML IV SOLN
INTRAVENOUS | Status: DC
  Administered 2015-12-29: 10:00:00 via INTRAVENOUS
  Administered 2015-12-29: 20.3 mg via INTRAVENOUS
  Filled 2015-12-29: qty 25

## 2015-12-29 MED ORDER — SODIUM CHLORIDE 0.9% FLUSH
10.0000 mL | INTRAVENOUS | Status: AC | PRN
  Administered 2015-12-29: 10 mL

## 2015-12-29 MED ORDER — HYDROMORPHONE HCL 2 MG/ML IJ SOLN: 1.0000 mg | INTRAMUSCULAR | Status: DC | PRN

## 2015-12-29 MED ORDER — SENNOSIDES-DOCUSATE SODIUM 8.6-50 MG PO TABS
1.0000 | ORAL_TABLET | Freq: Two times a day (BID) | ORAL | Status: DC
  Administered 2015-12-29: 1 via ORAL
  Filled 2015-12-29: qty 1

## 2015-12-29 MED ORDER — HYDROMORPHONE HCL 4 MG PO TABS: 4.0000 mg | ORAL_TABLET | ORAL | 0 refills | Status: DC | PRN

## 2015-12-29 MED ORDER — ONDANSETRON HCL 4 MG/2ML IJ SOLN: 4.0000 mg | Freq: Four times a day (QID) | INTRAMUSCULAR | Status: DC | PRN

## 2015-12-29 MED ORDER — KETOROLAC TROMETHAMINE 30 MG/ML IJ SOLN
30.0000 mg | Freq: Four times a day (QID) | INTRAMUSCULAR | Status: DC
  Administered 2015-12-29: 30 mg via INTRAVENOUS
  Filled 2015-12-29: qty 1

## 2015-12-29 MED ORDER — ZOLPIDEM TARTRATE 10 MG PO TABS: 10.0000 mg | ORAL_TABLET | Freq: Every evening | ORAL | 0 refills | Status: DC | PRN

## 2015-12-29 MED ORDER — DIPHENHYDRAMINE HCL 50 MG/ML IJ SOLN
25.0000 mg | INTRAMUSCULAR | Status: DC | PRN
  Filled 2015-12-29: qty 0.5

## 2015-12-29 MED ORDER — HEPARIN SOD (PORK) LOCK FLUSH 100 UNIT/ML IV SOLN
500.0000 [IU] | INTRAVENOUS | Status: AC | PRN
  Administered 2015-12-29: 500 [IU]
  Filled 2015-12-29: qty 5

## 2015-12-29 MED ORDER — MORPHINE SULFATE ER 30 MG PO TBCR: 30.0000 mg | EXTENDED_RELEASE_TABLET | Freq: Two times a day (BID) | ORAL | 0 refills | Status: DC

## 2015-12-29 MED ORDER — DEXTROSE-NACL 5-0.45 % IV SOLN
INTRAVENOUS | Status: DC
  Administered 2015-12-29: 10:00:00 via INTRAVENOUS

## 2015-12-29 MED ORDER — NALOXONE HCL 0.4 MG/ML IJ SOLN: 0.4000 mg | INTRAMUSCULAR | Status: DC | PRN

## 2015-12-29 MED ORDER — SODIUM CHLORIDE 0.9% FLUSH: 9.0000 mL | INTRAVENOUS | Status: DC | PRN

## 2015-12-29 MED ORDER — POLYETHYLENE GLYCOL 3350 17 G PO PACK: 17.0000 g | PACK | Freq: Every day | ORAL | Status: DC | PRN

## 2015-12-29 NOTE — Telephone Encounter (Signed)
Pt called back after speaking with the provider that he can come in for treatment. Pt stated he could get here in 30 minutes.

## 2015-12-29 NOTE — Discharge Summary (Addendum)
Physician Discharge Summary  [name] [id number] DOB: 1980-12-11 DOA: 12/29/2015  PCP: [name], MD  Admit date: 12/29/2015  Discharge date: 12/29/2015  Time spent: 30 minutes  Discharge Diagnoses:  Active Problems:   Sickle cell anemia with crisis [facility])   Discharge Condition: Stable  Diet recommendation: Regular  Filed Weights   12/29/15 0918  Weight: 184 lb (83.5 kg)    History of present illness:  [name] is a 35 y.o. male with history of sickle cell disease with associated multiple complications including multiple PEs on chronic anticoagulations with doubtful adherence, Pulm HTN on home oxygen, non-adherent to oxygen use, recently had another PE and CVA, who [facility] of9/10 pain in his bilateral legs, back and bilateral rib. He was seen in the ED 2 days ago for the same pain which he says is typical of his sickle cell related pain. Denies fever, chest pain, N/V, Diarrhea, abdominal pain or priapism. [place] Dilaudid and MS Contin at 6 am without sustained relief.  Hospital Course:  [name] was admitted to the day hospital with sickle cell painful crisis. Patient was treated with weight based IV Dilaudid PCA, IV Toradol as well as IV fluids. [name] showed significant improvement symptomatically, pain improved from 9 to 4/10 at the time of discharge. Patient was discharged home in a hemodynamically stable condition. [name] will follow-up at the clinic as previously scheduled, continue with home medications as per prior to admission.  Discharge Instructions We discussed the need for good hydration, monitoring of hydration status, avoidance of heat, cold, stress, and infection triggers. We discussed the need to be compliant with taking Hydrea. [name] was reminded of the need to seek medical attention of any symptoms of bleeding, anemia, or infection occurs.  Discharge Exam: Vitals:    12/29/15 1325 12/29/15 1427  BP: 107/71 116/84  Pulse: 79 88  Resp: 16 20  Temp:     General appearance: alert, cooperative and no distress Eyes: conjunctivae/corneas clear. PERRL, EOM's intact. Fundi benign. Neck: no adenopathy, no carotid bruit, no JVD, supple, symmetrical, trachea midline and thyroid not enlarged, symmetric, no tenderness/mass/nodules Back: symmetric, no curvature. ROM normal. No CVA tenderness. Resp: clear to auscultation bilaterally Chest wall: no tenderness Cardio: regular rate and rhythm, S1, S2 with loud P2 and systolic murmur, no click, rub or gallop GI: soft, non-tender; bowel sounds normal; no masses, no organomegaly Extremities: extremities normal, atraumatic, no cyanosis or edema Pulses: 2+ and symmetric Skin: Skin color, texture, turgor normal. No rashes or lesions Neurologic: Grossly normal  Current Discharge Medication List    CONTINUE these medications which have NOT CHANGED   Details  ambrisentan (LETAIRIS) 5 MG tablet Take 5 mg by mouth at bedtime.    aspirin 81 MG chewable tablet Chew 1 tablet (81 mg total) by mouth daily. Qty: 30 tablet, Refills: 11    cholecalciferol (VITAMIN D) 1000 units tablet Take 2,000 Units by mouth daily.    Deferasirox (JADENU) 360 MG TABS Take 1,080 mg by mouth daily.     folic acid (FOLVITE) 1 MG tablet Take 1 mg by mouth daily.    gabapentin (NEURONTIN) 300 MG capsule Take 1 capsule (300 mg total) by mouth 3 (three) times daily. Qty: 270 capsule, Refills: 3    hydroxyurea (HYDREA) 500 MG capsule Take 2 capsules (1,000 mg total) by mouth daily. May take with food to minimize GI side effects. Hold Hydrea until evaluated by Primary Provider or Hematologist.  lisinopril (PRINIVIL,ZESTRIL) 5 MG tablet Take 1 tablet (5 mg total) by mouth daily. Qty: 30 tablet, Refills: 1    potassium chloride SA (K-DUR,KLOR-CON) 20 MEQ tablet Take 2 tablets (40 mEq total) by mouth daily. Qty: 30 tablet, Refills: 1     rivaroxaban (XARELTO) 20 MG TABS tablet Take 1 tablet (20 mg total) by mouth at bedtime. Qty: 30 tablet, Refills: 3    HYDROmorphone (DILAUDID) 4 MG tablet Take 1 tablet (4 mg total) by mouth every 4 (four) hours as needed for moderate pain or severe pain. Qty: 90 tablet, Refills: 0    morphine (MS CONTIN) 30 MG 12 hr tablet Take 1 tablet (30 mg total) by mouth every 12 (twelve) hours. Qty: 60 tablet, Refills: 0    torsemide (DEMADEX) 20 MG tablet Take 20 mg by mouth daily as needed (for weight gain greater than 3lbs.).     zolpidem (AMBIEN) 10 MG tablet Take 1 tablet (10 mg total) by mouth at bedtime as needed for sleep. Qty: 30 tablet, Refills: 0   Associated Diagnoses: Primary insomnia       No Known Allergies  Significant Diagnostic Studies: Dg Chest 2 View  Result Date: 12/27/2015 CLINICAL DATA:  Bilateral rib pain since 3 p.m. today. History of sickle cell. History of acute chest syndrome. EXAM: CHEST  2 VIEW COMPARISON:  12/21/2015 FINDINGS: Power port type central venous catheter with tip over the cavoatrial junction region. No pneumothorax. No change in position since prior study. Cardiac enlargement without vascular congestion. No focal consolidation or edema. No blunting of costophrenic angles. No pneumothorax. Mediastinal contours appear intact. IMPRESSION: Cardiac enlargement. No evidence of active pulmonary disease. No focal consolidation. Electronically Signed   By: Lucienne Capers M.D.   On: 12/27/2015 06:47   Dg Chest 2 View  Result Date: 12/21/2015 CLINICAL DATA:  Hypoxia with shortness of breath history of sickle cell EXAM: CHEST  2 VIEW COMPARISON:  11/27/2015 FINDINGS: A left-sided central venous port tip overlies the distal SVC. Moderate-to-marked cardiomegaly is similar compared to the prior study. No effusion. No consolidation. No pneumothorax. Small amount of atelectasis at the left base. IMPRESSION: Cardiomegaly without acute infiltrate or overt pulmonary  edema. Electronically Signed   By: Donavan Foil M.D.   On: 12/21/2015 15:36   Dg Abd 1 View  Result Date: 12/16/2015 CLINICAL DATA:  Acute abdominal pain.  Nausea and vomiting. EXAM: ABDOMEN - 1 VIEW COMPARISON:  CT yesterday FINDINGS: Enteric contrast from CT yesterday in the distal transverse and descending colon. Small to moderate stool burden. No bowel dilatation to suggest obstruction. No evidence of free air on portable supine view. Cholecystectomy clips in the right upper quadrant of the abdomen. Right hip arthroplasty in place. IMPRESSION: No evidence of bowel obstruction or free air. Enteric contrast from CT yesterday in the distal transverse and descending colon. Small to moderate stool burden. Electronically Signed   By: Jeb Levering M.D.   On: 12/16/2015 17:35   Ct Abdomen Pelvis W Contrast  Result Date: 12/15/2015 CLINICAL DATA:  Upper abdominal pain, nausea, vomiting since 7 a.m. EXAM: CT ABDOMEN AND PELVIS WITH CONTRAST TECHNIQUE: Multidetector CT imaging of the abdomen and pelvis was performed using the standard protocol following bolus administration of intravenous contrast. CONTRAST:  181mL ISOVUE-300 IOPAMIDOL (ISOVUE-300) INJECTION 61% COMPARISON:  06/03/2015 FINDINGS: Lower chest: Cardiomegaly.  Mild lingular and bibasilar atelectasis. Hepatobiliary: No focal liver abnormality is seen. Status post cholecystectomy. No biliary dilatation. Pancreas: Unremarkable. No pancreatic ductal dilatation or surrounding  inflammatory changes. Spleen: Extensive chronic splenic infarction with calcification. Adrenals/Urinary Tract: Adrenal glands are unremarkable. Kidneys are normal, without renal calculi, focal lesion, or hydronephrosis. Bladder is unremarkable. Stomach/Bowel: Bowel wall thickening of the ascending and transverse colon concerning for colitis. No pneumatosis, pneumoperitoneum or portal venous gas. No bowel obstruction. Vascular/Lymphatic: Normal caliber abdominal aorta with  atherosclerosis. No lymphadenopathy. Reproductive: Prostate is unremarkable. Other: No fluid collection or hematoma.  No abdominal wall hernia. Musculoskeletal: No acute osseous abnormality. No aggressive is lytic or sclerotic osseous lesion. Mild osteosclerosis consistent with sickle cell anemia. Right total hip arthroplasty without failure complication. Mild subchondral sclerosis in the left femoral head concerning for avascular necrosis. IMPRESSION: 1. Bowel wall thickening of the ascending and transverse colon concerning for colitis which may be secondary to an infectious, inflammatory or ischemic etiology given the patient's history of sickle cell anemia. 2. Mild subchondral sclerosis in the left femoral head concerning for avascular necrosis. 3. Stable cardiomegaly. Electronically Signed   By: Kathreen Devoid   On: 12/15/2015 12:57   Signed:  [name] MD, [place], [place], [place], CPE   12/29/2015, 4:04 PM

## 2015-12-29 NOTE — Telephone Encounter (Signed)
Pt called to come to the [facility] for treatment. Pt stated he was having pain in his side and back. Stated his pain was 9/10. Pt last took his Dilaudid and MS contin at 6am without relief. Pt denies fever, chest pain, nausea or vomiting, diarrhea, priapism or abdominal pain. Will check with provider and give him a call back. Pt voiced understanding.

## 2015-12-29 NOTE — Progress Notes (Signed)
Patient ID: [name], male   DOB: 1980-04-30, 35 y.o.   MRN: [id number] Admitted to [facility] for complaints of pain in upper back and side. Treated with IV fluids, IV Toradol and PCA Dilaudid. Pain level on admission was 9/10 and at time of discharge pain level down to goal of 4/10. Went over discharge instructions and copy given to patient. Alert, oriented and ambulatory at time of discharge. Family member picking up for discharge.

## 2015-12-29 NOTE — Discharge Instructions (Signed)
Sickle Cell Anemia, Adult °Sickle cell anemia is a condition where your red blood cells are shaped like sickles. Red blood cells carry oxygen through the body. Sickle-shaped red blood cells do not live as long as normal red blood cells. They also clump together and block blood from flowing through the blood vessels. These things prevent the body from getting enough oxygen. Sickle cell anemia causes organ damage and pain. It also increases the risk of infection. °Follow these instructions at home: °· Drink enough fluid to keep your pee (urine) clear or pale yellow. Drink more in hot weather and during exercise. °· Do not smoke. Smoking lowers oxygen levels in the blood. °· Only take over-the-counter or prescription medicines as told by your doctor. °· Take antibiotic medicines as told by your doctor. Make sure you finish them even if you start to feel better. °· Take supplements as told by your doctor. °· Consider wearing a medical alert bracelet. This tells anyone caring for you in an emergency of your condition. °· When traveling, keep your medical information, doctors' names, and the medicines you take with you at all times. °· If you have a fever, do not take fever medicines right away. This could cover up a problem. Tell your doctor. °· Keep all follow-up visits with your doctor. Sickle cell anemia requires regular medical care. °Contact a doctor if: °You have a fever. °Get help right away if: °· You feel dizzy or faint. °· You have new belly (abdominal) pain, especially on the left side near the stomach area. °· You have a lasting, often uncomfortable and painful erection of the penis (priapism). If it is not treated right away, you will become unable to have sex (impotence). °· You have numbness in your arms or legs or you have a hard time moving them. °· You have a hard time talking. °· You have a fever or lasting symptoms for more than 2-3 days. °· You have a fever and your symptoms suddenly get  worse. °· You have signs or symptoms of infection. These include: °? Chills. °? Being more tired than normal (lethargy). °? Irritability. °? Poor eating. °? Throwing up (vomiting). °· You have pain that is not helped with medicine. °· You have shortness of breath. °· You have pain in your chest. °· You are coughing up pus-like or bloody mucus. °· You have a stiff neck. °· Your feet or hands swell or have pain. °· Your belly looks bloated. °· Your joints hurt. °This information is not intended to replace advice given to you by your health care provider. Make sure you discuss any questions you have with your health care provider. °Document Released: 11/07/2012 Document Revised: 06/25/2015 Document Reviewed: 08/29/2012 °Elsevier Interactive Patient Education © 2017 [employer] ° °

## 2015-12-29 NOTE — H&P (Signed)
[facility] History and Physical  ZIGMUND MCCARNEY [id number] DOB: [birth year] DOA: 12/29/2015  PCP: [name], MD   Chief Complaint: Sickle Cell Pain  HPI: [name] is a 35 y.o. 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 He was seen in the ED 2 days ago for the same pain which he says is typical of his sickle cell related pain. Denies fever, chest pain, N/V, Diarrhea, abdominal pain or priapism. [place] Dilaudid and MS Contin at 6 am without sustained relief.   Systemic Review: General: The patient denies anorexia, fever, weight loss Cardiac: Denies chest pain, syncope, palpitations, pedal edema  Respiratory: Denies cough, shortness of breath, wheezing GI: Denies severe indigestion/heartburn, abdominal pain, nausea, vomiting, diarrhea and constipation GU: Denies hematuria, incontinence, dysuria  Musculoskeletal: Denies arthritis  Skin: Denies suspicious skin lesions Neurologic: Denies focal weakness or numbness, change in vision  Past Medical History:  Diagnosis Date  . Acute chest syndrome ([place]) 06/18/2013  . Acute embolism and thrombosis of right internal jugular vein ([place])   . Alcohol consumption of one to four drinks per day   . Avascular necrosis (HCC)    Right Hip  . Blood transfusion   . Chronic anticoagulation   . Demand ischemia ([place]) 01/02/2014  . Former smoker   . Functional asplenia   . Hb-SS disease with crisis ([place])   . History of Clostridium difficile infection   . History of pulmonary embolus (PE)   . Hypertension   . Hypokalemia   . Leukocytosis    Chronic  . Mood disorder ([place])   . Noncompliance with  medication regimen   . Oxygen deficiency   . Pulmonary hypertension   . Second hand tobacco smoke exposure   . Sickle cell anemia (HCC)   . Sickle-cell crisis with associated acute chest syndrome ([place]) 05/13/2013  . Stroke ([place])   . Thrombocytosis (HCC)    Chronic  . Uses marijuana     Past Surgical History:  Procedure Laterality Date  . CHOLECYSTECTOMY     01/2008  . Excision of left periauricular cyst     10/2009  . Excision of right ear lobe cyst with primary closur     11/2007  . Porta cath placement    . Porta cath removal    . PORTACATH PLACEMENT  01/05/2012   Procedure: INSERTION PORT-A-CATH;  Surgeon: Odis Hollingshead, MD;  Location: [place];  Service: General;  Laterality: N/A;  ultrasound guiced port a cath insertion with fluoroscopy  . Right hip replacement     08/2006  . UMBILICAL HERNIA REPAIR     01/2008    No Known Allergies  Family History  Problem Relation Age of Onset  . Sickle cell trait Mother   . Depression Mother   . Diabetes Mother   . Sickle cell trait Father   . Sickle cell trait Brother       Prior to Admission medications   Medication Sig Start Date End Date Taking? Authorizing Provider  ambrisentan (LETAIRIS) 5 MG tablet Take 5 mg by mouth at bedtime.   Yes Historical Provider, MD  aspirin 81 MG chewable tablet Chew 1 tablet (81 mg total) by mouth daily. 07/23/14  Yes  [name], MD  cholecalciferol (VITAMIN D) 1000 units tablet Take 2,000 Units by mouth daily.   Yes Historical Provider, MD  Deferasirox (JADENU) 360 MG TABS Take 1,080 mg by mouth daily.    Yes Historical Provider, MD  folic acid (FOLVITE) 1 MG tablet Take 1 mg by mouth daily.   Yes Historical Provider, MD  gabapentin (NEURONTIN) 300 MG capsule Take 1 capsule (300 mg total) by mouth 3 (three) times daily. 06/01/15  Yes Tresa Garter, MD  HYDROmorphone (DILAUDID) 4 MG tablet Take 4 mg by mouth every 4 (four) hours as needed for moderate pain or severe pain.   Yes  Historical Provider, MD  hydroxyurea (HYDREA) 500 MG capsule Take 2 capsules (1,000 mg total) by mouth daily. May take with food to minimize GI side effects. Hold Hydrea until evaluated by Primary Provider or Hematologist. 12/22/15  Yes [name], MD  lisinopril (PRINIVIL,ZESTRIL) 5 MG tablet Take 1 tablet (5 mg total) by mouth daily. 08/04/15  Yes [name], MD  morphine (MS CONTIN) 30 MG 12 hr tablet Take 1 tablet (30 mg total) by mouth every 12 (twelve) hours. 12/06/15 01/05/16 Yes Dale Strausser Essie Christine, MD  potassium chloride SA (K-DUR,KLOR-CON) 20 MEQ tablet Take 2 tablets (40 mEq total) by mouth daily. 12/23/15  Yes [name], MD  rivaroxaban (XARELTO) 20 MG TABS tablet Take 20 mg by mouth at bedtime.    Yes Historical Provider, MD  zolpidem (AMBIEN) 10 MG tablet Take 1 tablet (10 mg total) by mouth at bedtime as needed for sleep. 12/06/15 01/05/16 Yes Roniesha Hollingshead Essie Christine, MD  torsemide (DEMADEX) 20 MG tablet Take 20 mg by mouth daily as needed (for weight gain greater than 3lbs.).     Historical Provider, MD     Physical Exam: Vitals:   12/29/15 0918  BP: 115/70  Pulse: 83  Resp: 20  Temp: 98.4 F (36.9 C)  TempSrc: Oral  SpO2: 99%  Weight: 184 lb (83.5 kg)  Height: 6' (1.829 m)    General: Alert, awake, afebrile, anicteric, not in obvious distress HEENT: Normocephalic and Atraumatic, Mucous membranes pink                PERRLA; EOM intact; No scleral icterus,                 Nares: Patent, Oropharynx: Clear, Fair Dentition                 Neck: FROM, no cervical lymphadenopathy, thyromegaly, carotid bruit or JVD;  CHEST WALL: No tenderness  CHEST: Normal respiration, clear to auscultation bilaterally  HEART: Regular rate and rhythm; Loud P2 ++ systolic murmurs, no rubs or gallops  BACK: No kyphosis or scoliosis; no CVA tenderness  ABDOMEN: Positive Bowel Sounds, soft, non-tender; no masses, no organomegaly EXTREMITIES: No cyanosis, clubbing, or  edema SKIN:  no rash or ulceration  CNS: Alert and Oriented x 4, Nonfocal exam, CN 2-12 intact  Labs on Admission:  Basic Metabolic Panel:  Recent Labs Lab 12/27/15 0608  NA 138  K 3.5  CL 107  CO2 24  GLUCOSE 113*  BUN 9  CREATININE 0.85  CALCIUM 9.0   Liver Function Tests:  Recent Labs Lab 12/27/15 0608  AST 20  ALT 14*  ALKPHOS 73  BILITOT 4.9*  PROT 7.7  ALBUMIN 4.3   No results for input(s): LIPASE, AMYLASE in the last 168 hours. No results for input(s): AMMONIA in the last 168 hours. CBC:  Recent Labs Lab 12/27/15 0608  WBC 14.4*  NEUTROABS 9.9*  HGB 8.3*  HCT 25.1*  MCV 89.3  PLT 678*   Cardiac Enzymes: No results for input(s): CKTOTAL, CKMB, CKMBINDEX, TROPONINI in the last 168 hours.  BNP (last 3 results)  Recent Labs  08/02/15 2307 11/28/15 0520  BNP 503.2* 422.5*    ProBNP (last 3 results) No results for input(s): PROBNP in the last 8760 hours.  CBG: No results for input(s): GLUCAP in the last 168 hours.   Assessment/Plan Active Problems:   Sickle cell anemia with crisis ([place])   [facility]  IVF D5 .45% Saline @ 125 mls/hour  Weight based Dilaudid PCA started within 30 minutes of admission  IV Toradol 30 mg Q 6 H  Monitor vitals very closely, Re-evaluate pain scale every hour  2 L of Oxygen by [place]  Patient will be re-evaluated for pain in the context of function and relationship to baseline as care progresses.  If no significant relieve from pain (remains above 5/10) will transfer patient to inpatient services for further evaluation and management  Code Status: Full  Family Communication: None  DVT Prophylaxis: Ambulate as tolerated   Time spent: 41 Minutes  Simran Mannis, MD, MHA, FACP, FAAP, CPE  If 7PM-7AM, please contact night-coverage [email or web address] 12/29/2015, 9:54 AM

## 2015-12-31 ENCOUNTER — Encounter (HOSPITAL_COMMUNITY): Payer: Self-pay

## 2015-12-31 ENCOUNTER — Emergency Department (HOSPITAL_COMMUNITY)
Admission: EM | Admit: 2015-12-31 | Discharge: 2015-12-31 | Disposition: A | Discharge status: Home / Self Care | Payer: Medicare Other | Attending: Emergency Medicine | Admitting: Emergency Medicine

## 2015-12-31 DIAGNOSIS — Z8673 Personal history of transient ischemic attack (TIA), and cerebral infarction without residual deficits: Secondary | ICD-10-CM | POA: Insufficient documentation

## 2015-12-31 DIAGNOSIS — Z7982 Long term (current) use of aspirin: Secondary | ICD-10-CM | POA: Diagnosis not present

## 2015-12-31 DIAGNOSIS — Z96641 Presence of right artificial hip joint: Secondary | ICD-10-CM | POA: Diagnosis not present

## 2015-12-31 DIAGNOSIS — D5700 Hb-SS disease with crisis, unspecified: Secondary | ICD-10-CM | POA: Insufficient documentation

## 2015-12-31 DIAGNOSIS — Z7901 Long term (current) use of anticoagulants: Secondary | ICD-10-CM | POA: Insufficient documentation

## 2015-12-31 DIAGNOSIS — I10 Essential (primary) hypertension: Secondary | ICD-10-CM | POA: Insufficient documentation

## 2015-12-31 DIAGNOSIS — Z87891 Personal history of nicotine dependence: Secondary | ICD-10-CM | POA: Insufficient documentation

## 2015-12-31 LAB — CBC WITH DIFFERENTIAL/PLATELET
BASOS ABS: 0.1 10*3/uL (ref 0.0–0.1)
Basophils Relative: 1 %
Eosinophils Absolute: 0.3 10*3/uL (ref 0.0–0.7)
Eosinophils Relative: 2 %
HEMATOCRIT: 25 % — AB (ref 39.0–52.0)
HEMOGLOBIN: 8.4 g/dL — AB (ref 13.0–17.0)
LYMPHS PCT: 10 %
Lymphs Abs: 1.3 10*3/uL (ref 0.7–4.0)
MCH: 30.4 pg (ref 26.0–34.0)
MCHC: 33.6 g/dL (ref 30.0–36.0)
MCV: 90.6 fL (ref 78.0–100.0)
MONOS PCT: 11 %
Monocytes Absolute: 1.4 10*3/uL — ABNORMAL HIGH (ref 0.1–1.0)
NEUTROS ABS: 9.5 10*3/uL — AB (ref 1.7–7.7)
Neutrophils Relative %: 76 %
Platelets: 685 10*3/uL — ABNORMAL HIGH (ref 150–400)
RBC: 2.76 MIL/uL — ABNORMAL LOW (ref 4.22–5.81)
RDW: 21.9 % — ABNORMAL HIGH (ref 11.5–15.5)
WBC: 12.6 10*3/uL — ABNORMAL HIGH (ref 4.0–10.5)

## 2015-12-31 LAB — BASIC METABOLIC PANEL
ANION GAP: 7 (ref 5–15)
BUN: 8 mg/dL (ref 6–20)
CALCIUM: 9.1 mg/dL (ref 8.9–10.3)
CO2: 23 mmol/L (ref 22–32)
CREATININE: 0.74 mg/dL (ref 0.61–1.24)
Chloride: 107 mmol/L (ref 101–111)
GFR calc Af Amer: >60 mL/min (ref >60)
GFR calc non Af Amer: >60 mL/min (ref >60)
GLUCOSE: 97 mg/dL (ref 65–99)
Potassium: 3.5 mmol/L (ref 3.5–5.1)
Sodium: 137 mmol/L (ref 135–145)

## 2015-12-31 LAB — RETICULOCYTES
RBC.: 2.76 MIL/uL — ABNORMAL LOW (ref 4.22–5.81)
RETIC COUNT ABSOLUTE: 289.8 10*3/uL — AB (ref 19.0–186.0)
Retic Ct Pct: 10.5 % — ABNORMAL HIGH (ref 0.4–3.1)

## 2015-12-31 MED ORDER — HYDROMORPHONE HCL 2 MG/ML IJ SOLN: 2.0000 mg | INTRAMUSCULAR | Status: AC

## 2015-12-31 MED ORDER — KETOROLAC TROMETHAMINE 15 MG/ML IJ SOLN
15.0000 mg | Freq: Once | INTRAMUSCULAR | Status: AC
  Administered 2015-12-31: 15 mg via INTRAVENOUS
  Filled 2015-12-31: qty 1

## 2015-12-31 MED ORDER — HYDROMORPHONE HCL 2 MG/ML IJ SOLN
2.0000 mg | INTRAMUSCULAR | Status: AC
  Administered 2015-12-31: 2 mg via INTRAVENOUS
  Filled 2015-12-31: qty 1

## 2015-12-31 MED ORDER — SODIUM CHLORIDE 0.45 % IV SOLN
INTRAVENOUS | Status: DC
  Administered 2015-12-31: 09:00:00 via INTRAVENOUS

## 2015-12-31 MED ORDER — HEPARIN SOD (PORK) LOCK FLUSH 100 UNIT/ML IV SOLN
500.0000 [IU] | Freq: Once | INTRAVENOUS | Status: AC
Start: 2015-12-31 — End: 2015-12-31
  Administered 2015-12-31: 500 [IU]
  Filled 2015-12-31: qty 5

## 2015-12-31 MED ORDER — DIPHENHYDRAMINE HCL 25 MG PO CAPS
25.0000 mg | ORAL_CAPSULE | ORAL | Status: DC | PRN
  Administered 2015-12-31: 25 mg via ORAL
  Filled 2015-12-31: qty 1

## 2015-12-31 MED ORDER — HYDROMORPHONE HCL 2 MG/ML IJ SOLN
2.0000 mg | INTRAMUSCULAR | Status: AC
  Administered 2015-12-31: 2 mg via INTRAVENOUS
  Filled 2015-12-31 (×2): qty 1

## 2015-12-31 MED ORDER — DIPHENHYDRAMINE HCL 50 MG/ML IJ SOLN
25.0000 mg | Freq: Once | INTRAMUSCULAR | Status: AC
  Administered 2015-12-31: 25 mg via INTRAVENOUS
  Filled 2015-12-31: qty 1

## 2015-12-31 MED ORDER — HYDROMORPHONE HCL 2 MG/ML IJ SOLN
2.0000 mg | INTRAMUSCULAR | Status: AC
  Administered 2015-12-31: 2 mg via INTRAVENOUS

## 2015-12-31 MED ORDER — HYDROMORPHONE HCL 2 MG/ML IJ SOLN
2.0000 mg | Freq: Once | INTRAMUSCULAR | Status: AC
  Administered 2015-12-31: 2 mg via INTRAVENOUS
  Filled 2015-12-31: qty 1

## 2015-12-31 NOTE — ED Triage Notes (Signed)
Pt is here with sickle cell pain.  States pain started on Tuesday in back and rib cage.  Denies chest pain.

## 2015-12-31 NOTE — ED Provider Notes (Addendum)
[facility] DEPT Provider Note   CSN: [id number] Arrival date & time: 12/31/15  0820     History   Chief Complaint Chief Complaint  Patient presents with  . Sickle Cell Pain Crisis  . Back Pain    HPI MICHAELPAUL FOLKER is a 35 y.o. male.  HPI  Pt comes in with cc of sickle cell pain. Pt reports that with the weather change he started having rib pain and back pain. Pain is typical of his sickle cell as far as the site and character of pain is concerned. Pt has no n/v/f/c/cough. Last seen 2 days ago for same. Pt's home meds are not very effective in controlling his pain long term.  Past Medical History:  Diagnosis Date  . Acute chest syndrome ([place]) 06/18/2013  . Acute embolism and thrombosis of right internal jugular vein ([place])   . Alcohol consumption of one to four drinks per day   . Avascular necrosis (HCC)    Right Hip  . Blood transfusion   . Chronic anticoagulation   . Demand ischemia ([place]) 01/02/2014  . Former smoker   . Functional asplenia   . Hb-SS disease with crisis ([place])   . History of Clostridium difficile infection   . History of pulmonary embolus (PE)   . Hypertension   . Hypokalemia   . Leukocytosis    Chronic  . Mood disorder ([place])   . Noncompliance with medication regimen   . Oxygen deficiency   . Pulmonary hypertension   . Second hand tobacco smoke exposure   . Sickle cell anemia (HCC)   . Sickle-cell crisis with associated acute chest syndrome ([place]) 05/13/2013  . Stroke ([place])   . Thrombocytosis (HCC)    Chronic  . Uses marijuana     Patient Active Problem List   Diagnosis Date Noted  . Abdominal pain   . Colitis 12/15/2015  . Slurred speech 11/10/2015  . Pulmonary emboli ([place]) 11/10/2015  . Aphasia   . Acute chest syndrome ([place])   . Ischemic stroke ([place])   . Sickle cell crisis ([place]) 10/27/2015  . Sickle cell pain crisis ([place]) 09/18/2015  . Sickle cell anemia with crisis ([place]) 09/04/2015  . Shortness of breath   . Pulmonary  embolism without acute cor pulmonale ([place])   . Fever   . Sickle cell anemia with pain ([place]) 05/07/2015  . Acute kidney injury ([place])   . Hypoxia   . Anemia 01/31/2015  . Chronic anemia   . Hb-SS disease without crisis ([place]) 10/07/2014  . Acute on chronic respiratory failure with hypoxia ([place])   . PVC's (premature ventricular contractions) 09/10/2014  . Abnormal EKG 09/10/2014  . Chronic atrial fibrillation ([place]) 09/09/2014  . Cor pulmonale, chronic ([place]) 08/25/2014  . Sickle cell anemia ([place]) 06/25/2014  . Anemia of chronic disease 06/25/2014  . PAH (pulmonary artery hypertension) 03/18/2014  . Hb-SS disease with crisis ([place]) 01/22/2014  . Pulmonary embolus ([place]) 01/01/2014  . Paralytic strabismus, external ophthalmoplegia   . Chronic pain syndrome 12/12/2013  . Elevated troponin 11/26/2013  . Chronic anticoagulation 08/22/2013  . Essential hypertension 08/22/2013  . Pulmonary HTN 06/18/2013  . Functional asplenia   . Vitamin D deficiency 02/13/2013  . Hx of pulmonary embolus 06/29/2012  . Hemochromatosis 12/14/2011  . Avascular necrosis [facility])     Past Surgical History:  Procedure Laterality Date  . CHOLECYSTECTOMY     01/2008  . Excision of left periauricular cyst     10/2009  . Excision of  right ear lobe cyst with primary closur     11/2007  . Porta cath placement    . Porta cath removal    . PORTACATH PLACEMENT  01/05/2012   Procedure: INSERTION PORT-A-CATH;  Surgeon: Odis Hollingshead, MD;  Location: [facility];  Service: General;  Laterality: N/A;  ultrasound guiced port a cath insertion with fluoroscopy  . Right hip replacement     08/2006  . UMBILICAL HERNIA REPAIR     01/2008       Home Medications    Prior to Admission medications   Medication Sig Start Date End Date Taking? Authorizing Provider  ambrisentan (LETAIRIS) 5 MG tablet Take 5 mg by mouth at bedtime.   Yes Historical Provider, MD  aspirin 81 MG chewable tablet Chew 1 tablet (81 mg total) by mouth  daily. 07/23/14  Yes [name], MD  cholecalciferol (VITAMIN D) 1000 units tablet Take 2,000 Units by mouth daily.   Yes Historical Provider, MD  Deferasirox (JADENU) 360 MG TABS Take 1,080 mg by mouth daily.    Yes Historical Provider, MD  folic acid (FOLVITE) 1 MG tablet Take 1 mg by mouth daily.   Yes Historical Provider, MD  gabapentin (NEURONTIN) 300 MG capsule Take 1 capsule (300 mg total) by mouth 3 (three) times daily. 06/01/15  Yes Tresa Garter, MD  HYDROmorphone (DILAUDID) 4 MG tablet Take 1 tablet (4 mg total) by mouth every 4 (four) hours as needed for moderate pain or severe pain. 12/29/15 01/13/16 Yes Olugb[place]iga Essie Christine, MD  hydroxyurea (HYDREA) 500 MG capsule Take 2 capsules (1,000 mg total) by mouth daily. May take with food to minimize GI side effects. Hold Hydrea until evaluated by Primary Provider or Hematologist. 12/22/15  Yes [name], MD  lisinopril (PRINIVIL,ZESTRIL) 5 MG tablet Take 1 tablet (5 mg total) by mouth daily. 08/04/15  Yes [name], MD  morphine (MS CONTIN) 30 MG 12 hr tablet Take 1 tablet (30 mg total) by mouth every 12 (twelve) hours. 12/29/15 01/28/16 Yes Olugbemiga Essie Christine, MD  potassium chloride SA (K-DUR,KLOR-CON) 20 MEQ tablet Take 2 tablets (40 mEq total) by mouth daily. 12/23/15  Yes [name], MD  rivaroxaban (XARELTO) 20 MG TABS tablet Take 1 tablet (20 mg total) by mouth at bedtime. 12/29/15  Yes Tresa Garter, MD  torsemide (DEMADEX) 20 MG tablet Take 20 mg by mouth daily as needed (for weight gain greater than 3lbs.).    Yes Historical Provider, MD  zolpidem (AMBIEN) 10 MG tablet Take 1 tablet (10 mg total) by mouth at bedtime as needed for sleep. 12/29/15 01/28/16 Yes Tresa Garter, MD    Family History Family History  Problem Relation Age of Onset  . Sickle cell trait Mother   . Depression Mother   . Diabetes Mother   . Sickle cell trait Father   . Sickle cell trait Brother     Social  History Social History  Substance Use Topics  . Smoking status: Former Smoker    Packs/day: 0.50    Years: 10.00    Types: Cigarettes    Quit date: 05/29/2011  . Smokeless tobacco: Never Used  . Alcohol use No     Allergies   Patient has no known allergies.   Review of Systems Review of Systems  ROS 10 Systems reviewed and are negative for acute change except as noted in the HPI.     Physical Exam Updated Vital Signs BP 111/83  Pulse 76   Temp 98.1 F (36.7 C) (Oral)   Resp 15   Wt 173 lb 4.5 oz (78.6 kg)   SpO2 98%   BMI 23.50 kg/m   Physical Exam  Constitutional: He is oriented to person, place, and time. He appears well-developed.  HENT:  Head: Normocephalic and atraumatic.  Eyes: Conjunctivae and EOM are normal. Pupils are equal, round, and reactive to light.  Neck: Normal range of motion. Neck supple.  Cardiovascular: Normal rate and regular rhythm.   Pulmonary/Chest: Effort normal and breath sounds normal. He has no wheezes.  Abdominal: Soft. Bowel sounds are normal. He exhibits no distension. There is no tenderness. There is no rebound and no guarding.  Neurological: He is alert and oriented to person, place, and time.  Skin: Skin is warm.  Nursing note and vitals reviewed.    ED Treatments / Results  Labs (all labs ordered are listed, but only abnormal results are displayed) Labs Reviewed  RETICULOCYTES - Abnormal; Notable for the following:       Result Value   Retic Ct Pct 10.5 (*)    RBC. 2.76 (*)    Retic Count, Manual 289.8 (*)    All other components within normal limits  CBC WITH DIFFERENTIAL/PLATELET - Abnormal; Notable for the following:    WBC 12.6 (*)    RBC 2.76 (*)    Hemoglobin 8.4 (*)    HCT 25.0 (*)    RDW 21.9 (*)    Platelets 685 (*)    Neutro Abs 9.5 (*)    Monocytes Absolute 1.4 (*)    All other components within normal limits  BASIC METABOLIC PANEL    EKG  EKG Interpretation None       Radiology No  results found.  Procedures Procedures (including critical care time)  Medications Ordered in ED Medications  0.45 % sodium chloride infusion ( Intravenous Stopped 12/31/15 1143)  diphenhydrAMINE (BENADRYL) capsule 25-50 mg (25 mg Oral Given 12/31/15 0922)  HYDROmorphone (DILAUDID) injection 2 mg (2 mg Intravenous Given 12/31/15 0922)    Or  HYDROmorphone (DILAUDID) injection 2 mg ( Subcutaneous See Alternative 12/31/15 0922)  HYDROmorphone (DILAUDID) injection 2 mg (2 mg Intravenous Given 12/31/15 1107)    Or  HYDROmorphone (DILAUDID) injection 2 mg ( Subcutaneous See Alternative 12/31/15 1107)  HYDROmorphone (DILAUDID) injection 2 mg (2 mg Intravenous Given 12/31/15 1021)    Or  HYDROmorphone (DILAUDID) injection 2 mg ( Subcutaneous See Alternative 12/31/15 1021)  diphenhydrAMINE (BENADRYL) injection 25 mg (25 mg Intravenous Given 12/31/15 1116)  ketorolac (TORADOL) 15 MG/ML injection 15 mg (15 mg Intravenous Given 12/31/15 1359)  HYDROmorphone (DILAUDID) injection 2 mg (2 mg Intravenous Given 12/31/15 1359)     Initial Impression / Assessment and Plan / ED Course  I have reviewed the triage vital signs and the nursing notes.  Pertinent labs & imaging results that were available during my care of the patient were reviewed by me and considered in my medical decision making (see chart for details).  Clinical Course as of Dec 31 1435  Thu Dec 31, 2015  1416 Patient reassessed. Pt is comfortable at this time. Still is some pain and but it is tolerable.  Results of the workup discussed. Strict ER return precautions discussed. Follow up instruction discussed, and pt agrees with the plan and is comfortable with it.  Pt advised to go to clinic tomorrow if he needs just pain control.   [AN]    Clinical Course User Index [AN]  Varney Biles, MD    Pt comes in with sickle cell related pain.  VSS and WNL -  hemodynamically stable  Pain appears vaso-occlusive tpye and typical of  previous pain. Will start mild hydration with 1/2 NS while patient is getting his workup. Appropriate labs ordered. Doubt acute chest - and CXR not ordered. Pain control with dilaudid started - We will reassess patient after 3 doses. Goal is to break the pain and see if patient feels comfortable going home.  Currently, there is no signs of severe decompensation clinically. Will continue to monitor closely. If we are unable to control the pain, we will admit the patient.  Final Clinical Impressions(s) / ED Diagnoses   Final diagnoses:  Sickle cell pain crisis [facility])    New Prescriptions New Prescriptions   No medications on file     Varney Biles, MD 12/31/15 [place], MD 12/31/15 1437

## 2015-12-31 NOTE — Discharge Instructions (Signed)
We saw you in the ER for your sickle cell related pain. °The labs and imaging indicate no true crisis at this time. We understand you are in sickle cell related pain none the less, and advise to call your sickle cell doctor for a followup as soon as possible. °If your symptoms get worse, return to the ER. ° °

## 2016-01-01 ENCOUNTER — Non-Acute Institutional Stay (EMERGENCY_DEPARTMENT_HOSPITAL)
Admission: AD | Admit: 2016-01-01 | Discharge: 2016-01-01 | Disposition: A | Discharge status: Home / Self Care | Payer: Medicare Other | Source: Ambulatory Visit | Attending: Internal Medicine | Admitting: Internal Medicine

## 2016-01-01 ENCOUNTER — Encounter (HOSPITAL_COMMUNITY): Payer: Self-pay | Admitting: *Deleted

## 2016-01-01 ENCOUNTER — Telehealth (HOSPITAL_COMMUNITY): Payer: Self-pay | Admitting: *Deleted

## 2016-01-01 ENCOUNTER — Emergency Department (HOSPITAL_COMMUNITY): Payer: Medicare Other

## 2016-01-01 ENCOUNTER — Other Ambulatory Visit: Payer: Self-pay

## 2016-01-01 ENCOUNTER — Emergency Department (HOSPITAL_COMMUNITY)
Admission: EM | Admit: 2016-01-01 | Discharge: 2016-01-01 | Disposition: A | Discharge status: Home / Self Care | Payer: Medicare Other | Attending: Emergency Medicine | Admitting: Emergency Medicine

## 2016-01-01 DIAGNOSIS — Z79899 Other long term (current) drug therapy: Secondary | ICD-10-CM | POA: Diagnosis not present

## 2016-01-01 DIAGNOSIS — D5700 Hb-SS disease with crisis, unspecified: Secondary | ICD-10-CM | POA: Diagnosis present

## 2016-01-01 DIAGNOSIS — I10 Essential (primary) hypertension: Secondary | ICD-10-CM | POA: Insufficient documentation

## 2016-01-01 DIAGNOSIS — Z87891 Personal history of nicotine dependence: Secondary | ICD-10-CM | POA: Insufficient documentation

## 2016-01-01 DIAGNOSIS — Z8673 Personal history of transient ischemic attack (TIA), and cerebral infarction without residual deficits: Secondary | ICD-10-CM | POA: Insufficient documentation

## 2016-01-01 DIAGNOSIS — Z7982 Long term (current) use of aspirin: Secondary | ICD-10-CM | POA: Diagnosis not present

## 2016-01-01 DIAGNOSIS — R0781 Pleurodynia: Secondary | ICD-10-CM | POA: Diagnosis not present

## 2016-01-01 LAB — RETICULOCYTES
RBC.: 2.72 MIL/uL — ABNORMAL LOW (ref 4.22–5.81)
RETIC COUNT ABSOLUTE: 301.9 10*3/uL — AB (ref 19.0–186.0)
Retic Ct Pct: 11.1 % — ABNORMAL HIGH (ref 0.4–3.1)

## 2016-01-01 LAB — CBC
HEMATOCRIT: 24.3 % — AB (ref 39.0–52.0)
Hemoglobin: 8.1 g/dL — ABNORMAL LOW (ref 13.0–17.0)
MCH: 30 pg (ref 26.0–34.0)
MCHC: 33.3 g/dL (ref 30.0–36.0)
MCV: 90 fL (ref 78.0–100.0)
Platelets: 592 10*3/uL — ABNORMAL HIGH (ref 150–400)
RBC: 2.7 MIL/uL — ABNORMAL LOW (ref 4.22–5.81)
RDW: 22 % — AB (ref 11.5–15.5)
WBC: 14.2 10*3/uL — AB (ref 4.0–10.5)

## 2016-01-01 LAB — BASIC METABOLIC PANEL
Anion gap: 6 (ref 5–15)
BUN: 13 mg/dL (ref 6–20)
CHLORIDE: 108 mmol/L (ref 101–111)
CO2: 23 mmol/L (ref 22–32)
Calcium: 8.8 mg/dL — ABNORMAL LOW (ref 8.9–10.3)
Creatinine, Ser: 0.83 mg/dL (ref 0.61–1.24)
GFR calc Af Amer: >60 mL/min (ref >60)
GFR calc non Af Amer: >60 mL/min (ref >60)
GLUCOSE: 104 mg/dL — AB (ref 65–99)
POTASSIUM: 3.6 mmol/L (ref 3.5–5.1)
Sodium: 137 mmol/L (ref 135–145)

## 2016-01-01 MED ORDER — POLYETHYLENE GLYCOL 3350 17 G PO PACK: 17.0000 g | PACK | Freq: Every day | ORAL | Status: DC | PRN

## 2016-01-01 MED ORDER — DEXTROSE-NACL 5-0.45 % IV SOLN
INTRAVENOUS | Status: DC
  Administered 2016-01-01: 10:00:00 via INTRAVENOUS

## 2016-01-01 MED ORDER — SODIUM CHLORIDE 0.9% FLUSH
10.0000 mL | INTRAVENOUS | Status: AC | PRN
  Administered 2016-01-01: 10 mL

## 2016-01-01 MED ORDER — HYDROMORPHONE HCL 2 MG/ML IJ SOLN
2.0000 mg | Freq: Once | INTRAMUSCULAR | Status: AC
  Administered 2016-01-01: 2 mg via INTRAVENOUS
  Filled 2016-01-01: qty 1

## 2016-01-01 MED ORDER — HEPARIN SOD (PORK) LOCK FLUSH 100 UNIT/ML IV SOLN
500.0000 [IU] | INTRAVENOUS | Status: AC | PRN
  Administered 2016-01-01: 500 [IU]
  Filled 2016-01-01: qty 5

## 2016-01-01 MED ORDER — SODIUM CHLORIDE 0.9 % IV SOLN
25.0000 mg | INTRAVENOUS | Status: DC | PRN
  Filled 2016-01-01: qty 0.5

## 2016-01-01 MED ORDER — NALOXONE HCL 0.4 MG/ML IJ SOLN: 0.4000 mg | INTRAMUSCULAR | Status: DC | PRN

## 2016-01-01 MED ORDER — KETOROLAC TROMETHAMINE 30 MG/ML IJ SOLN
30.0000 mg | Freq: Four times a day (QID) | INTRAMUSCULAR | Status: DC
  Administered 2016-01-01: 30 mg via INTRAVENOUS
  Filled 2016-01-01: qty 1

## 2016-01-01 MED ORDER — DIPHENHYDRAMINE HCL 50 MG/ML IJ SOLN
25.0000 mg | Freq: Once | INTRAMUSCULAR | Status: AC
  Administered 2016-01-01: 25 mg via INTRAVENOUS
  Filled 2016-01-01: qty 1

## 2016-01-01 MED ORDER — SENNOSIDES-DOCUSATE SODIUM 8.6-50 MG PO TABS
1.0000 | ORAL_TABLET | Freq: Two times a day (BID) | ORAL | Status: DC
  Administered 2016-01-01: 1 via ORAL
  Filled 2016-01-01: qty 1

## 2016-01-01 MED ORDER — HEPARIN SOD (PORK) LOCK FLUSH 100 UNIT/ML IV SOLN
500.0000 [IU] | Freq: Once | INTRAVENOUS | Status: AC
  Administered 2016-01-01: 500 [IU]
  Filled 2016-01-01: qty 5

## 2016-01-01 MED ORDER — HYDROMORPHONE HCL 2 MG/ML IJ SOLN
2.0000 mg | Freq: Once | INTRAMUSCULAR | Status: AC
Start: 2016-01-01 — End: 2016-01-01
  Administered 2016-01-01: 2 mg via INTRAVENOUS
  Filled 2016-01-01: qty 1

## 2016-01-01 MED ORDER — ONDANSETRON HCL 4 MG/2ML IJ SOLN
4.0000 mg | Freq: Four times a day (QID) | INTRAMUSCULAR | Status: DC | PRN
Start: 2016-01-01 — End: 2016-01-01

## 2016-01-01 MED ORDER — DIPHENHYDRAMINE HCL 25 MG PO CAPS
25.0000 mg | ORAL_CAPSULE | ORAL | Status: DC | PRN
  Administered 2016-01-01: 50 mg via ORAL
  Filled 2016-01-01: qty 2

## 2016-01-01 MED ORDER — SODIUM CHLORIDE 0.9% FLUSH: 9.0000 mL | INTRAVENOUS | Status: DC | PRN

## 2016-01-01 MED ORDER — HYDROMORPHONE HCL 2 MG/ML IJ SOLN
2.0000 mg | INTRAMUSCULAR | Status: DC | PRN
  Administered 2016-01-01 (×2): 2 mg via INTRAVENOUS
  Filled 2016-01-01 (×2): qty 1

## 2016-01-01 MED ORDER — HYDROMORPHONE 1 MG/ML IV SOLN
INTRAVENOUS | Status: DC
  Administered 2016-01-01: 10:00:00 via INTRAVENOUS
  Administered 2016-01-01: 16.1 mg via INTRAVENOUS
  Filled 2016-01-01: qty 25

## 2016-01-01 NOTE — ED Provider Notes (Signed)
[facility] DEPT Provider Note   CSN: [id number] Arrival date & time: 01/01/16  0153  By signing my name below, I, Soijett Blue, attest that this documentation has been prepared under the direction and in the presence of Veryl Speak, MD. Electronically Signed: Soijett Blue, ED Scribe. 01/01/16. 3:15 AM.  History   Chief Complaint Chief Complaint  Patient presents with  . Sickle Cell Pain Crisis    HPI [name] is a 35 y.o. male with a PMHx of sickle cell anemia, sickle-cell crisis with associated acute chest syndrome, HTN, who presents to the Emergency Department complaining of sickle cell pain crisis onset 3 days ago. He states that he is having associated symptoms of right rib pain and back pain. He states that he has not tried any medications relief of his symptoms. He denies cough, SOB, and any other symptoms.   The history is provided by the patient. No language interpreter was used.    Past Medical History:  Diagnosis Date  . Acute chest syndrome ([place]) 06/18/2013  . Acute embolism and thrombosis of right internal jugular vein ([place])   . Alcohol consumption of one to four drinks per day   . Avascular necrosis (HCC)    Right Hip  . Blood transfusion   . Chronic anticoagulation   . Demand ischemia ([place]) 01/02/2014  . Former smoker   . Functional asplenia   . Hb-SS disease with crisis ([place])   . History of Clostridium difficile infection   . History of pulmonary embolus (PE)   . Hypertension   . Hypokalemia   . Leukocytosis    Chronic  . Mood disorder ([place])   . Noncompliance with medication regimen   . Oxygen deficiency   . Pulmonary hypertension   . Second hand tobacco smoke exposure   . Sickle cell anemia (HCC)   . Sickle-cell crisis with associated acute chest syndrome ([place]) 05/13/2013  . Stroke ([place])   . Thrombocytosis (HCC)    Chronic  . Uses marijuana     Patient Active Problem List   Diagnosis Date Noted  . Abdominal pain   . Colitis  12/15/2015  . Slurred speech 11/10/2015  . Pulmonary emboli ([employer]) 11/10/2015  . Aphasia   . Acute chest syndrome ([place])   . Ischemic stroke ([place])   . Sickle cell crisis ([place]) 10/27/2015  . Sickle cell pain crisis ([place]) 09/18/2015  . Sickle cell anemia with crisis ([employer]) 09/04/2015  . Shortness of breath   . Pulmonary embolism without acute cor pulmonale ([place])   . Fever   . Sickle cell anemia with pain ([place]) 05/07/2015  . Acute kidney injury ([place])   . Hypoxia   . Anemia 01/31/2015  . Chronic anemia   . Hb-SS disease without crisis ([place]) 10/07/2014  . Acute on chronic respiratory failure with hypoxia ([place])   . PVC's (premature ventricular contractions) 09/10/2014  . Abnormal EKG 09/10/2014  . Chronic atrial fibrillation ([employer]) 09/09/2014  . Cor pulmonale, chronic ([place]) 08/25/2014  . Sickle cell anemia ([place]) 06/25/2014  . Anemia of chronic disease 06/25/2014  . PAH (pulmonary artery hypertension) 03/18/2014  . Hb-SS disease with crisis ([place]) 01/22/2014  . Pulmonary embolus ([place]) 01/01/2014  . Paralytic strabismus, external ophthalmoplegia   . Chronic pain syndrome 12/12/2013  . Elevated troponin 11/26/2013  . Chronic anticoagulation 08/22/2013  . Essential hypertension 08/22/2013  . Pulmonary HTN 06/18/2013  . Functional asplenia   . Vitamin D deficiency 02/13/2013  . Hx of pulmonary embolus 06/29/2012  .  Hemochromatosis 12/14/2011  . Avascular necrosis [facility])     Past Surgical History:  Procedure Laterality Date  . CHOLECYSTECTOMY     01/2008  . Excision of left periauricular cyst     10/2009  . Excision of right ear lobe cyst with primary closur     11/2007  . Porta cath placement    . Porta cath removal    . PORTACATH PLACEMENT  01/05/2012   Procedure: INSERTION PORT-A-CATH;  Surgeon: Odis Hollingshead, MD;  Location: [facility];  Service: General;  Laterality: N/A;  ultrasound guiced port a cath insertion with fluoroscopy  . Right hip replacement     08/2006  . UMBILICAL  HERNIA REPAIR     01/2008       Home Medications    Prior to Admission medications   Medication Sig Start Date End Date Taking? Authorizing Provider  ambrisentan (LETAIRIS) 5 MG tablet Take 5 mg by mouth at bedtime.    Historical Provider, MD  aspirin 81 MG chewable tablet Chew 1 tablet (81 mg total) by mouth daily. 07/23/14   [name], MD  cholecalciferol (VITAMIN D) 1000 units tablet Take 2,000 Units by mouth daily.    Historical Provider, MD  Deferasirox (JADENU) 360 MG TABS Take 1,080 mg by mouth daily.     Historical Provider, MD  folic acid (FOLVITE) 1 MG tablet Take 1 mg by mouth daily.    Historical Provider, MD  gabapentin (NEURONTIN) 300 MG capsule Take 1 capsule (300 mg total) by mouth 3 (three) times daily. 06/01/15   Tresa Garter, MD  HYDROmorphone (DILAUDID) 4 MG tablet Take 1 tablet (4 mg total) by mouth every 4 (four) hours as needed for moderate pain or severe pain. 12/29/15 01/13/16  Tresa Garter, MD  hydroxyurea (HYDREA) 500 MG capsule Take 2 capsules (1,000 mg total) by mouth daily. May take with food to minimize GI side effects. Hold Hydrea until evaluated by Primary Provider or Hematologist. 12/22/15   [name], MD  lisinopril (PRINIVIL,ZESTRIL) 5 MG tablet Take 1 tablet (5 mg total) by mouth daily. 08/04/15   [name], MD  morphine (MS CONTIN) 30 MG 12 hr tablet Take 1 tablet (30 mg total) by mouth every 12 (twelve) hours. 12/29/15 01/28/16  Tresa Garter, MD  potassium chloride SA (K-DUR,KLOR-CON) 20 MEQ tablet Take 2 tablets (40 mEq total) by mouth daily. 12/23/15   [name], MD  rivaroxaban (XARELTO) 20 MG TABS tablet Take 1 tablet (20 mg total) by mouth at bedtime. 12/29/15   Tresa Garter, MD  torsemide (DEMADEX) 20 MG tablet Take 20 mg by mouth daily as needed (for weight gain greater than 3lbs.).     Historical Provider, MD  zolpidem (AMBIEN) 10 MG tablet Take 1 tablet (10 mg total) by mouth at  bedtime as needed for sleep. 12/29/15 01/28/16  Tresa Garter, MD    Family History Family History  Problem Relation Age of Onset  . Sickle cell trait Mother   . Depression Mother   . Diabetes Mother   . Sickle cell trait Father   . Sickle cell trait Brother     Social History Social History  Substance Use Topics  . Smoking status: Former Smoker    Packs/day: 0.50    Years: 10.00    Types: Cigarettes    Quit date: 05/29/2011  . Smokeless tobacco: Never Used  . Alcohol use No     Allergies  Patient has no known allergies.   Review of Systems Review of Systems  All other systems reviewed and are negative.    Physical Exam Updated Vital Signs BP 122/81 (BP Location: Right Arm)   Pulse 77   Temp 98.4 F (36.9 C) (Oral)   Resp 24   Ht 6' (1.829 m)   Wt 183 lb (83 kg)   SpO2 96%   BMI 24.82 kg/m   Physical Exam  Constitutional: He is oriented to person, place, and time. He appears well-developed and well-nourished. No distress.  HENT:  Head: Normocephalic and atraumatic.  Eyes: EOM are normal.  Neck: Neck supple.  Cardiovascular: Normal rate, regular rhythm and normal heart sounds.  Exam reveals no gallop and no friction rub.   No murmur heard. Pulmonary/Chest: Effort normal and breath sounds normal. No respiratory distress. He has no wheezes. He has no rales.  Abdominal: Soft. He exhibits no distension. There is no tenderness.  Musculoskeletal: Normal range of motion.  Neurological: He is alert and oriented to person, place, and time.  Skin: Skin is warm and dry.  Psychiatric: He has a normal mood and affect. His behavior is normal.  Nursing note and vitals reviewed.    ED Treatments / Results  DIAGNOSTIC STUDIES: Oxygen Saturation is 96% on RA, nl by my interpretation.    COORDINATION OF CARE: 3:15 AM Discussed treatment plan with pt at bedside which includes labs, CXR, EKG, and pt agreed to plan.   Labs (all labs ordered are listed, but  only abnormal results are displayed) Labs Reviewed  BASIC METABOLIC PANEL  CBC    EKG  EKG Interpretation None       Radiology Dg Chest 2 View  Result Date: 01/01/2016 CLINICAL DATA:  Sickle cell pain crisis. Bilateral anterior rib pain. Initial encounter. EXAM: CHEST  2 VIEW COMPARISON:  Chest radiograph performed 12/27/2015 FINDINGS: The lungs are well-aerated and clear. There is no evidence of focal opacification, pleural effusion or pneumothorax. The heart is mildly enlarged. A left-sided chest port is noted ending about the distal SVC. No acute osseous abnormalities are seen. IMPRESSION: Mild cardiomegaly. Lungs remain grossly clear. No displaced rib fracture seen. Electronically Signed   By: Garald Balding M.D.   On: 01/01/2016 02:32    Procedures Procedures (including critical care time)  Medications Ordered in ED Medications - No data to display   Initial Impression / Assessment and Plan / ED Course  I have reviewed the triage vital signs and the nursing notes.  Pertinent labs & imaging results that were available during my care of the patient were reviewed by me and considered in my medical decision making (see chart for details).  Clinical Course     Patient with history of sickle cell disease. He presents with leg and back pain that he believes is a sickle cell crisis. His laboratory studies are essentially unchanged and workup is not suggestive of an acute chest or other emergent situation. He was given the usual pain medications and I believe is appropriate for discharge.  Final Clinical Impressions(s) / ED Diagnoses   Final diagnoses:  None    New Prescriptions New Prescriptions   No medications on file   I personally performed the services described in this documentation, which was scribed in my presence. The recorded information has been reviewed and is accurate.        Veryl Speak, MD 01/01/16 [phone]

## 2016-01-01 NOTE — Progress Notes (Signed)
Patient refuses incentive spirometer.  RN educated patient on the importance of use, but patient refused and asked me not to bring it into the room.  RN will continue to monitor the patient.

## 2016-01-01 NOTE — Telephone Encounter (Signed)
Dr. Doreene Burke advises for patient to come in for treatment to the [facility] and patient aware. Patient's speech is slurred  and Dr. Doreene Burke aware. Ptatient says he just woke up.

## 2016-01-01 NOTE — H&P (Signed)
[facility] History and Physical  [name] [id number] DOB: 1980-08-07 DOA: 01/01/2016  PCP: [name], MD   Chief Complaint: Sickle Cell pain  HPI: [name] is a 35 y.o. male with history of sickle cell disease with associated multiple complications including multiple PEs on chronic anticoagulations with doubtful adherence, Pulm HTN on home oxygen, non-adherent to oxygen use, who [facility] complaint of9/10 pain in his bilateral legs, back and bilateral rib. He was seen in the ED this morning for the same pain which he says is typical of his sickle cell related pain, managed briefly and transitioned to the day hospital for extended observation and pain management. Denies fever, chest pain, N/V, Diarrhea, abdominal pain or priapism.   Systemic Review: General: The patient denies anorexia, fever, weight loss Cardiac: Denies chest pain, syncope, palpitations, pedal edema  Respiratory: Denies cough, shortness of breath, wheezing GI: Denies severe indigestion/heartburn, abdominal pain, nausea, vomiting, diarrhea and constipation GU: Denies hematuria, incontinence, dysuria  Musculoskeletal: Denies arthritis  Skin: Denies suspicious skin lesions Neurologic: Denies focal weakness or numbness, change in vision  Past Medical History:  Diagnosis Date  . Acute chest syndrome ([place]) 06/18/2013  . Acute embolism and thrombosis of right internal jugular vein ([place])   . Alcohol consumption of one to four drinks per day   . Avascular necrosis (HCC)    Right Hip  . Blood transfusion   . Chronic anticoagulation   . Demand ischemia ([place]) 01/02/2014  . Former smoker   . Functional asplenia   . Hb-SS disease with crisis ([place])   . History of Clostridium difficile infection   . History of pulmonary embolus (PE)   . Hypertension   . Hypokalemia   . Leukocytosis    Chronic  . Mood disorder ([place])   . Noncompliance with  medication regimen   . Oxygen deficiency   . Pulmonary hypertension   . Second hand tobacco smoke exposure   . Sickle cell anemia (HCC)   . Sickle-cell crisis with associated acute chest syndrome ([place]) 05/13/2013  . Stroke ([place])   . Thrombocytosis (HCC)    Chronic  . Uses marijuana     Past Surgical History:  Procedure Laterality Date  . CHOLECYSTECTOMY     01/2008  . Excision of left periauricular cyst     10/2009  . Excision of right ear lobe cyst with primary closur     11/2007  . Porta cath placement    . Porta cath removal    . PORTACATH PLACEMENT  01/05/2012   Procedure: INSERTION PORT-A-CATH;  Surgeon: Odis Hollingshead, MD;  Location: [facility];  Service: General;  Laterality: N/A;  ultrasound guiced port a cath insertion with fluoroscopy  . Right hip replacement     08/2006  . UMBILICAL HERNIA REPAIR     01/2008   No Known Allergies  Family History  Problem Relation Age of Onset  . Sickle cell trait Mother   . Depression Mother   . Diabetes Mother   . Sickle cell trait Father   . Sickle cell trait Brother     Prior to Admission medications   Medication Sig Start Date End Date Taking? Authorizing Provider  ambrisentan (LETAIRIS) 5 MG tablet Take 5 mg by mouth at bedtime.   Yes Historical Provider, MD  aspirin 81 MG chewable tablet Chew 1 tablet (81 mg total) by mouth daily. 07/23/14  Yes [name], MD  cholecalciferol (  VITAMIN D) 1000 units tablet Take 2,000 Units by mouth daily.   Yes Historical Provider, MD  Deferasirox (JADENU) 360 MG TABS Take 1,080 mg by mouth daily.    Yes Historical Provider, MD  folic acid (FOLVITE) 1 MG tablet Take 1 mg by mouth daily.   Yes Historical Provider, MD  gabapentin (NEURONTIN) 300 MG capsule Take 1 capsule (300 mg total) by mouth 3 (three) times daily. 06/01/15  Yes Tresa Garter, MD  HYDROmorphone (DILAUDID) 4 MG tablet Take 1 tablet (4 mg total) by mouth every 4 (four) hours as needed for moderate pain or severe pain.  12/29/15 01/13/16 Yes Phyllistine Domingos Essie Christine, MD  hydroxyurea (HYDREA) 500 MG capsule Take 2 capsules (1,000 mg total) by mouth daily. May take with food to minimize GI side effects. Hold Hydrea until evaluated by Primary Provider or Hematologist. 12/22/15  Yes [name], MD  lisinopril (PRINIVIL,ZESTRIL) 5 MG tablet Take 1 tablet (5 mg total) by mouth daily. 08/04/15  Yes [name], MD  morphine (MS CONTIN) 30 MG 12 hr tablet Take 1 tablet (30 mg total) by mouth every 12 (twelve) hours. 12/29/15 01/28/16 Yes Aayden Cefalu Essie Christine, MD  potassium chloride SA (K-DUR,KLOR-CON) 20 MEQ tablet Take 2 tablets (40 mEq total) by mouth daily. 12/23/15  Yes [name], MD  rivaroxaban (XARELTO) 20 MG TABS tablet Take 1 tablet (20 mg total) by mouth at bedtime. 12/29/15  Yes Tresa Garter, MD  torsemide (DEMADEX) 20 MG tablet Take 20 mg by mouth daily as needed (for weight gain greater than 3lbs.).    Yes Historical Provider, MD  zolpidem (AMBIEN) 10 MG tablet Take 1 tablet (10 mg total) by mouth at bedtime as needed for sleep. 12/29/15 01/28/16 Yes Tresa Garter, MD   Physical Exam: Vitals:   01/01/16 0900  BP: 120/75  Resp: 20  Temp: 98.3 F (36.8 C)  TempSrc: Oral  SpO2: 93%  Weight: 183 lb (83 kg)  Height: 6' (1.829 m)   General: Alert, awake, afebrile, anicteric, not in obvious distress HEENT: Normocephalic and Atraumatic, Mucous membranes pink                PERRLA; EOM intact; No scleral icterus,                 Nares: Patent, Oropharynx: Clear, Fair Dentition                 Neck: FROM, no cervical lymphadenopathy, thyromegaly, carotid bruit or JVD;  CHEST WALL: No tenderness  CHEST: Normal respiration, clear to auscultation bilaterally  HEART: Regular rate and rhythm; systolic murmurs ++, no rubs or gallops  BACK: No kyphosis or scoliosis; no CVA tenderness  ABDOMEN: Positive Bowel Sounds, soft, non-tender; no masses, no organomegaly EXTREMITIES: No  cyanosis, clubbing, or edema SKIN:  no rash or ulceration  CNS: Alert and Oriented x 4, Nonfocal exam, CN 2-12 intact  Labs on Admission:  Basic Metabolic Panel:  Recent Labs Lab 12/27/15 0608 12/31/15 0928 01/01/16 0320  NA 138 137 137  K 3.5 3.5 3.6  CL 107 107 108  CO2 24 23 23   GLUCOSE 113* 97 104*  BUN 9 8 13   CREATININE 0.85 0.74 0.83  CALCIUM 9.0 9.1 8.8*   Liver Function Tests:  Recent Labs Lab 12/27/15 0608  AST 20  ALT 14*  ALKPHOS 73  BILITOT 4.9*  PROT 7.7  ALBUMIN 4.3   No results for input(s): LIPASE, AMYLASE in the last  168 hours. No results for input(s): AMMONIA in the last 168 hours. CBC:  Recent Labs Lab 12/27/15 0608 12/31/15 0928 01/01/16 0320  WBC 14.4* 12.6* 14.2*  NEUTROABS 9.9* 9.5*  --   HGB 8.3* 8.4* 8.1*  HCT 25.1* 25.0* 24.3*  MCV 89.3 90.6 90.0  PLT 678* 685* 592*   Cardiac Enzymes: No results for input(s): CKTOTAL, CKMB, CKMBINDEX, TROPONINI in the last 168 hours.  BNP (last 3 results)  Recent Labs  08/02/15 2307 11/28/15 0520  BNP 503.2* 422.5*    ProBNP (last 3 results) No results for input(s): PROBNP in the last 8760 hours.  CBG: No results for input(s): GLUCAP in the last 168 hours.   Assessment/Plan Active Problems:   Sickle cell anemia with crisis ([place])   Admits to the Day Hospital  IVF D5 .45% Saline @ 100 mls/hour  Weight based Dilaudid PCA started within 30 minutes of admission plus clinician assisted doses  IV Toradol 30 mg Q 6 H  Monitor vitals very closely, Re-evaluate pain scale every hour  2 L of Oxygen by [place]  Patient will be re-evaluated for pain in the context of function and relationship to baseline as care progresses.  If no significant relieve from pain (remains above 5/10) will transfer patient to inpatient services for further evaluation and management  Code Status: Full  Family Communication: None  DVT Prophylaxis: Ambulate as tolerated   Time spent: 53  Minutes  Gerald Cielo, MD, MHA, FACP, FAAP, CPE  If 7PM-7AM, please contact night-coverage [email or web address] 01/01/2016, 10:04 AM

## 2016-01-01 NOTE — Telephone Encounter (Signed)
Called [facility] complaining of pain in side and back with 9/10 pain intensity. Denies fever, chest pain, N/V and abdominal pain. Took MS Contin and Dilaudid at 6:30 am. Wants to come in for treatment. Will notifiy the provider and call patient back.

## 2016-01-01 NOTE — ED Triage Notes (Addendum)
Pt having sickle cell pain in ribs and back pain since Tuesday. No SOB, dizziness, other symptoms reported.

## 2016-01-01 NOTE — Consult Note (Signed)
   [facility] CM Inpatient Consult   01/01/2016  JAHNI TROAST 11-11-1980 [id number]   [facility] referral received from [place] Management office. Chart reviewed. It appears per chart that Mr. Arquette is in the [facility] currently, not inpatient or in ED. Of note, Mr. Lapp often declines [facility] Care Management re-engagement when offered at bedside. Made [facility] Care Management office aware of the above.    Marthenia Rolling, MSN-Ed, RN,BSN [facility] Liaison [phone]

## 2016-01-01 NOTE — Patient Outreach (Signed)
[facility]) Care Management  01/01/2016  [name] May 09, 1980 [id number]   Patient on high ER Utilization. Patient being seen in sickle cell clinic as ED visits.  Patient recently engaged on December 17, 2015 and refused.   Plan: Will close case and notify care management assistant of patient status.   Jone Baseman, RN, MSN [place] [phone]

## 2016-01-01 NOTE — Discharge Instructions (Signed)
Sickle Cell Anemia, Adult  Please wear oxygen at home as ordered.   Sickle cell anemia is a condition where your red blood cells are shaped like sickles. Red blood cells carry oxygen through the body. Sickle-shaped red blood cells do not live as long as normal red blood cells. They also clump together and block blood from flowing through the blood vessels. These things prevent the body from getting enough oxygen. Sickle cell anemia causes organ damage and pain. It also increases the risk of infection. Follow these instructions at home:  Drink enough fluid to keep your pee (urine) clear or pale yellow. Drink more in hot weather and during exercise.  Do not smoke. Smoking lowers oxygen levels in the blood.  Only take over-the-counter or prescription medicines as told by your doctor.  Take antibiotic medicines as told by your doctor. Make sure you finish them even if you start to feel better.  Take supplements as told by your doctor.  Consider wearing a medical alert bracelet. This tells anyone caring for you in an emergency of your condition.  When traveling, keep your medical information, doctors' names, and the medicines you take with you at all times.  If you have a fever, do not take fever medicines right away. This could cover up a problem. Tell your doctor.  Keep all follow-up visits with your doctor. Sickle cell anemia requires regular medical care. Contact a doctor if: You have a fever. Get help right away if:  You feel dizzy or faint.  You have new belly (abdominal) pain, especially on the left side near the stomach area.  You have a lasting, often uncomfortable and painful erection of the penis (priapism). If it is not treated right away, you will become unable to have sex (impotence).  You have numbness in your arms or legs or you have a hard time moving them.  You have a hard time talking.  You have a fever or lasting symptoms for more than 2-3 days.  You have a  fever and your symptoms suddenly get worse.  You have signs or symptoms of infection. These include:  Chills.  Being more tired than normal (lethargy).  Irritability.  Poor eating.  Throwing up (vomiting).  You have pain that is not helped with medicine.  You have shortness of breath.  You have pain in your chest.  You are coughing up pus-like or bloody mucus.  You have a stiff neck.  Your feet or hands swell or have pain.  Your belly looks bloated.  Your joints hurt. This information is not intended to replace advice given to you by your health care provider. Make sure you discuss any questions you have with your health care provider. Document Released: 11/07/2012 Document Revised: 06/25/2015 Document Reviewed: 08/29/2012 Elsevier Interactive Patient Education  2017 [employer].

## 2016-01-01 NOTE — Progress Notes (Signed)
Pt received to the [facility] for treatment. Pt stated his pain was in his side and back. He stated his pain was 9/10. He was treated with IV fluids, Dilaudid PCA, Toradol and IVPs of Dilaudid times 3. Pt was encourage to rest and drink fluids. Pt's pain was 5/10 at discharge. Discharge instructions given to him with verbal understanding. Pt called for his ride home.

## 2016-01-01 NOTE — Discharge Instructions (Signed)
Follow-up with your primary Dr. tomorrow as scheduled.

## 2016-01-03 ENCOUNTER — Emergency Department (HOSPITAL_COMMUNITY): Payer: Medicare Other

## 2016-01-03 ENCOUNTER — Emergency Department (HOSPITAL_COMMUNITY)
Admission: EM | Admit: 2016-01-03 | Discharge: 2016-01-03 | Disposition: A | Discharge status: Home / Self Care | Payer: Medicare Other | Attending: Emergency Medicine | Admitting: Emergency Medicine

## 2016-01-03 ENCOUNTER — Encounter (HOSPITAL_COMMUNITY): Payer: Self-pay | Admitting: Emergency Medicine

## 2016-01-03 DIAGNOSIS — Z79899 Other long term (current) drug therapy: Secondary | ICD-10-CM | POA: Diagnosis not present

## 2016-01-03 DIAGNOSIS — Z7982 Long term (current) use of aspirin: Secondary | ICD-10-CM | POA: Insufficient documentation

## 2016-01-03 DIAGNOSIS — I10 Essential (primary) hypertension: Secondary | ICD-10-CM | POA: Insufficient documentation

## 2016-01-03 DIAGNOSIS — Z7901 Long term (current) use of anticoagulants: Secondary | ICD-10-CM | POA: Insufficient documentation

## 2016-01-03 DIAGNOSIS — D5700 Hb-SS disease with crisis, unspecified: Secondary | ICD-10-CM | POA: Insufficient documentation

## 2016-01-03 DIAGNOSIS — R079 Chest pain, unspecified: Secondary | ICD-10-CM | POA: Diagnosis not present

## 2016-01-03 DIAGNOSIS — Z96641 Presence of right artificial hip joint: Secondary | ICD-10-CM | POA: Insufficient documentation

## 2016-01-03 DIAGNOSIS — Z8673 Personal history of transient ischemic attack (TIA), and cerebral infarction without residual deficits: Secondary | ICD-10-CM | POA: Diagnosis not present

## 2016-01-03 DIAGNOSIS — Z87891 Personal history of nicotine dependence: Secondary | ICD-10-CM | POA: Diagnosis not present

## 2016-01-03 LAB — PROTIME-INR
INR: 1.36
PROTHROMBIN TIME: 16.8 s — AB (ref 11.4–15.2)

## 2016-01-03 LAB — RETICULOCYTES
RBC.: 2.82 MIL/uL — AB (ref 4.22–5.81)
RETIC COUNT ABSOLUTE: 284.8 10*3/uL — AB (ref 19.0–186.0)
RETIC CT PCT: 10.1 % — AB (ref 0.4–3.1)

## 2016-01-03 LAB — COMPREHENSIVE METABOLIC PANEL
ALK PHOS: 78 U/L (ref 38–126)
ALT: 17 U/L (ref 17–63)
ANION GAP: 5 (ref 5–15)
AST: 27 U/L (ref 15–41)
Albumin: 4.6 g/dL (ref 3.5–5.0)
BILIRUBIN TOTAL: 5.7 mg/dL — AB (ref 0.3–1.2)
BUN: 8 mg/dL (ref 6–20)
CALCIUM: 8.7 mg/dL — AB (ref 8.9–10.3)
CO2: 23 mmol/L (ref 22–32)
Chloride: 108 mmol/L (ref 101–111)
Creatinine, Ser: 0.79 mg/dL (ref 0.61–1.24)
GLUCOSE: 126 mg/dL — AB (ref 65–99)
POTASSIUM: 3.1 mmol/L — AB (ref 3.5–5.1)
Sodium: 136 mmol/L (ref 135–145)
TOTAL PROTEIN: 7.8 g/dL (ref 6.5–8.1)

## 2016-01-03 LAB — CBC WITH DIFFERENTIAL/PLATELET
BASOS ABS: 0.1 10*3/uL (ref 0.0–0.1)
Basophils Relative: 1 %
EOS ABS: 0.3 10*3/uL (ref 0.0–0.7)
Eosinophils Relative: 2 %
HCT: 25.4 % — ABNORMAL LOW (ref 39.0–52.0)
HEMOGLOBIN: 8.6 g/dL — AB (ref 13.0–17.0)
LYMPHS PCT: 15 %
Lymphs Abs: 2.1 10*3/uL (ref 0.7–4.0)
MCH: 30.5 pg (ref 26.0–34.0)
MCHC: 33.9 g/dL (ref 30.0–36.0)
MCV: 90.1 fL (ref 78.0–100.0)
Monocytes Absolute: 1.9 10*3/uL — ABNORMAL HIGH (ref 0.1–1.0)
Monocytes Relative: 13 %
NEUTROS ABS: 9.9 10*3/uL — AB (ref 1.7–7.7)
NEUTROS PCT: 69 %
Platelets: 580 10*3/uL — ABNORMAL HIGH (ref 150–400)
RBC: 2.82 MIL/uL — AB (ref 4.22–5.81)
RDW: 21.6 % — ABNORMAL HIGH (ref 11.5–15.5)
WBC: 14.3 10*3/uL — AB (ref 4.0–10.5)

## 2016-01-03 LAB — I-STAT CG4 LACTIC ACID, ED: Lactic Acid, Venous: 1.01 mmol/L (ref 0.5–1.9)

## 2016-01-03 IMAGING — CR DG CHEST 2V
2 series · 2 of 2 positions shown · non-contrast
Comparison: 01/13/2013

CLINICAL DATA: Chest pain, sickle cell crisis

EXAM:
CHEST  2 VIEW

[w chest pa]
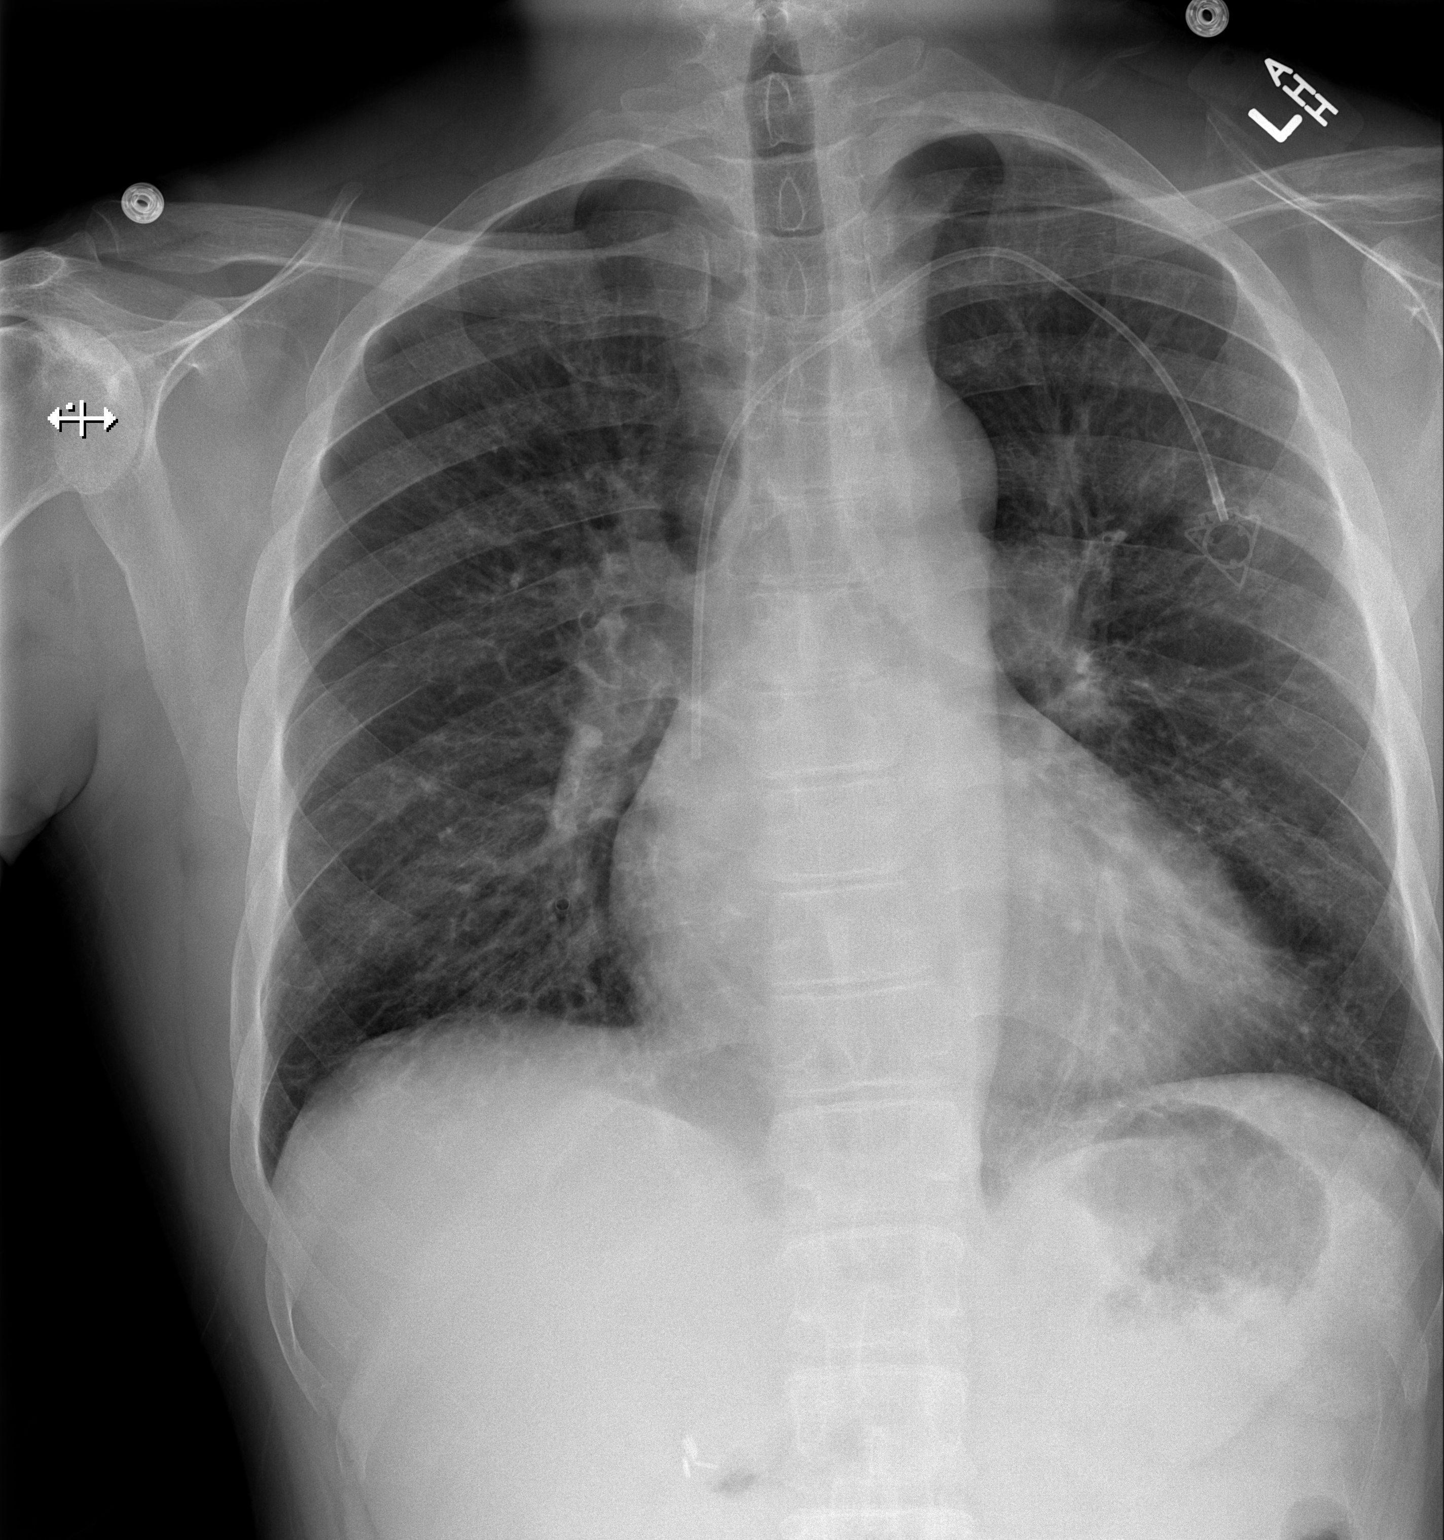

[w chest lat]
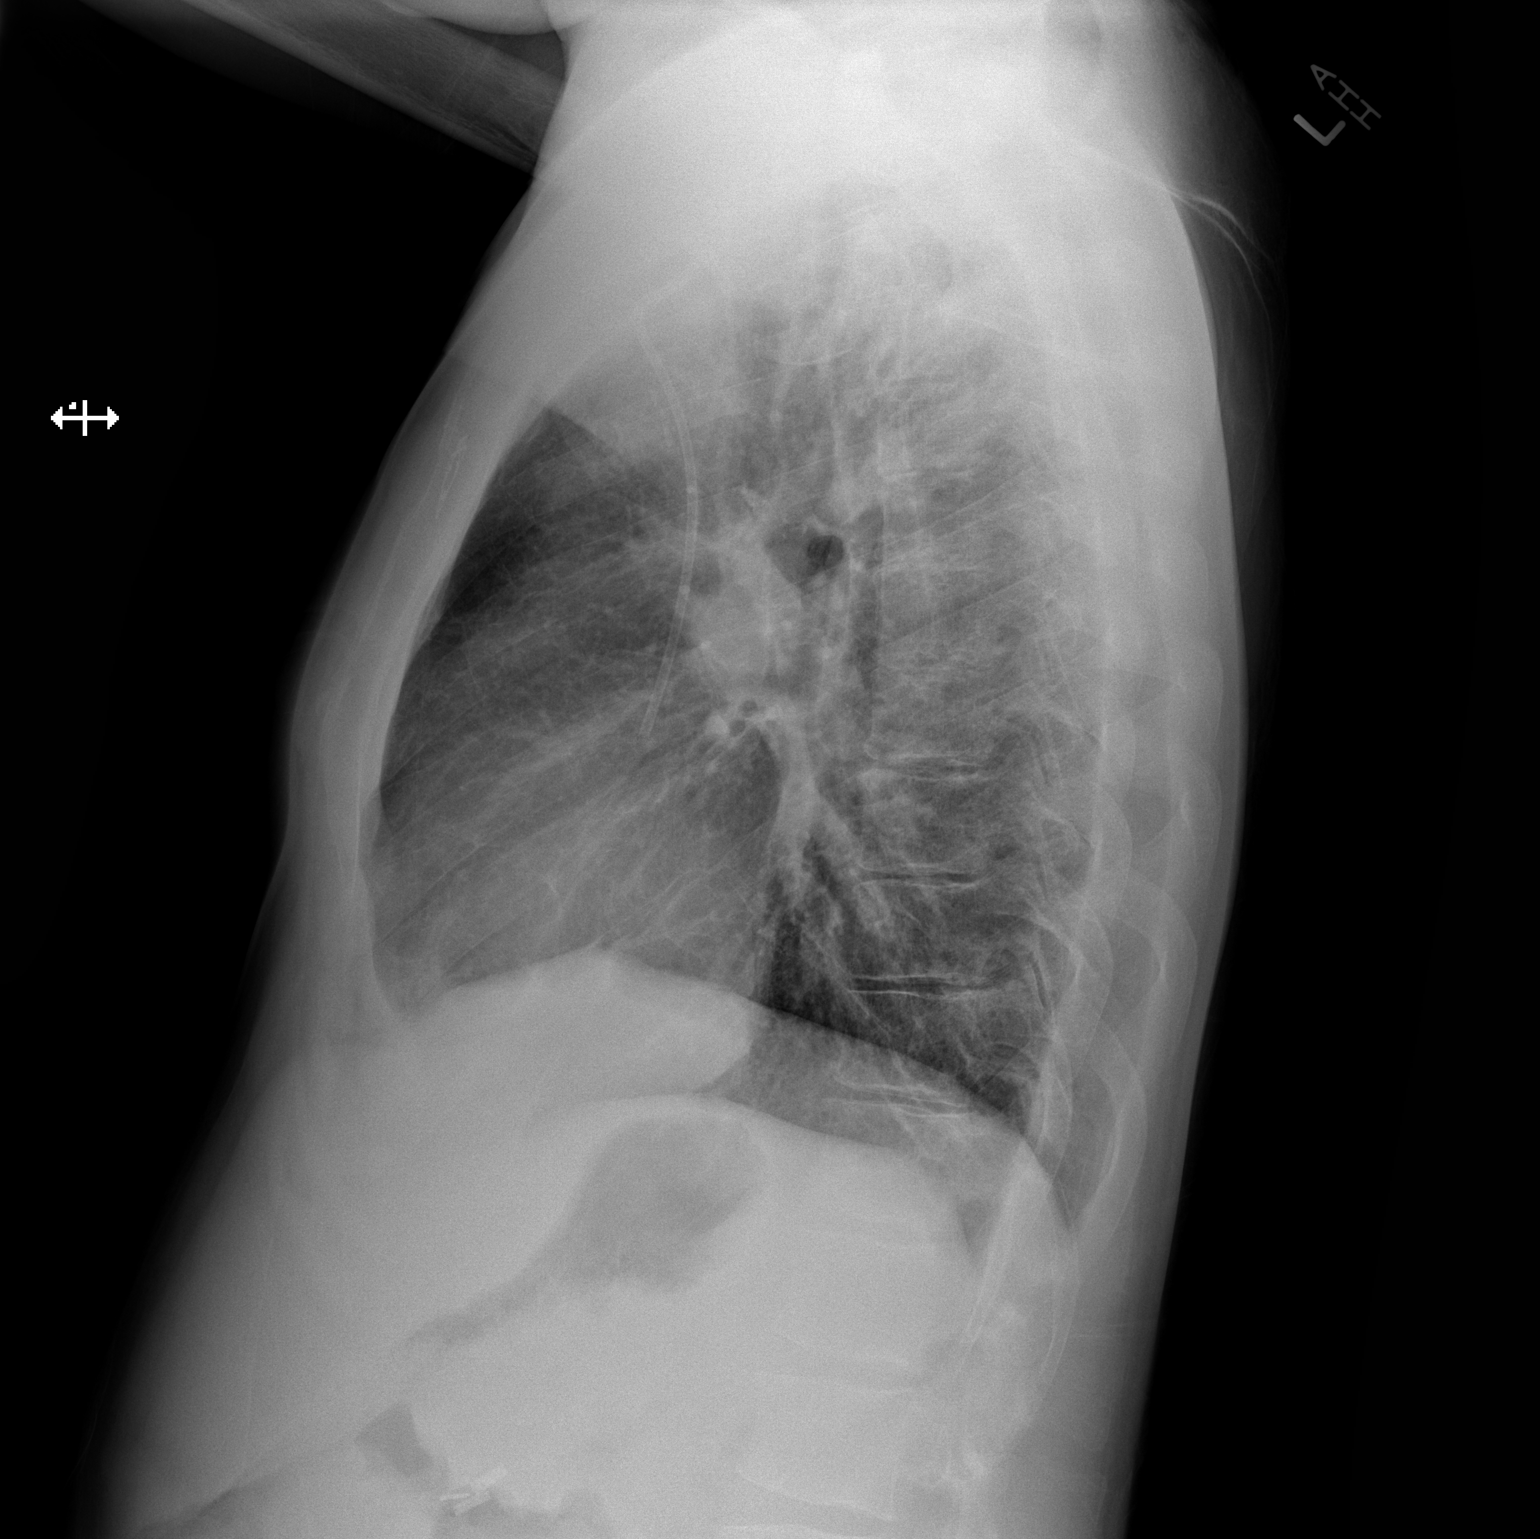

[2 of 2 positions shown; findings below may reference images not displayed]

FINDINGS: Left subclavian power port catheter tip at the SVC RA junction.
Stable mild cardiomegaly with vascular and interstitial prominence
appearing chronic. No superimposed CHF or pneumonia. Mild bibasilar
streaky scarring. No effusion or pneumothorax. Trachea midline.
Prior cholecystectomy noted.
IMPRESSION: Stable cardiomegaly with interstitial prominence and parenchymal
scarring. No superimposed acute process

## 2016-01-03 MED ORDER — HYDROMORPHONE HCL 2 MG/ML IJ SOLN: 2.0000 mg | INTRAMUSCULAR | Status: AC

## 2016-01-03 MED ORDER — HYDROMORPHONE HCL 2 MG/ML IJ SOLN
2.0000 mg | INTRAMUSCULAR | Status: DC
  Administered 2016-01-03: 2 mg via INTRAVENOUS
  Filled 2016-01-03: qty 1

## 2016-01-03 MED ORDER — DIPHENHYDRAMINE HCL 50 MG/ML IJ SOLN
25.0000 mg | Freq: Once | INTRAMUSCULAR | Status: AC
  Administered 2016-01-03: 25 mg via INTRAVENOUS
  Filled 2016-01-03: qty 1

## 2016-01-03 MED ORDER — HYDROMORPHONE HCL 2 MG/ML IJ SOLN
2.0000 mg | INTRAMUSCULAR | Status: AC
  Filled 2016-01-03: qty 1

## 2016-01-03 MED ORDER — ONDANSETRON HCL 4 MG/2ML IJ SOLN
4.0000 mg | INTRAMUSCULAR | Status: DC | PRN
  Administered 2016-01-03: 4 mg via INTRAVENOUS
  Filled 2016-01-03: qty 2

## 2016-01-03 MED ORDER — HEPARIN SOD (PORK) LOCK FLUSH 100 UNIT/ML IV SOLN
INTRAVENOUS | Status: AC
  Administered 2016-01-03: 19:00:00
  Filled 2016-01-03: qty 5

## 2016-01-03 MED ORDER — DEXTROSE-NACL 5-0.45 % IV SOLN
INTRAVENOUS | Status: DC
  Administered 2016-01-03: 15:00:00 via INTRAVENOUS

## 2016-01-03 MED ORDER — HYDROMORPHONE HCL 2 MG/ML IJ SOLN
2.0000 mg | INTRAMUSCULAR | Status: AC
  Administered 2016-01-03: 2 mg via INTRAVENOUS
  Filled 2016-01-03: qty 1

## 2016-01-03 MED ORDER — HYDROMORPHONE HCL 2 MG/ML IJ SOLN: 2.0000 mg | INTRAMUSCULAR | Status: DC

## 2016-01-03 MED ORDER — POTASSIUM CHLORIDE CRYS ER 20 MEQ PO TBCR
40.0000 meq | EXTENDED_RELEASE_TABLET | Freq: Once | ORAL | Status: AC
  Administered 2016-01-03: 40 meq via ORAL
  Filled 2016-01-03: qty 2

## 2016-01-03 MED ORDER — HYDROMORPHONE HCL 2 MG/ML IJ SOLN
2.0000 mg | INTRAMUSCULAR | Status: AC
  Administered 2016-01-03: 2 mg via INTRAVENOUS

## 2016-01-03 NOTE — ED Triage Notes (Signed)
Patient c/o sickle cell pain in lower back, lateral sides and bilat ankles since Tuesday last week.

## 2016-01-03 NOTE — ED Provider Notes (Signed)
[facility] DEPT Provider Note   CSN: [id number] Arrival date & time: 01/03/16  1356     History   Chief Complaint Chief Complaint  Patient presents with  . Sickle Cell Pain Crisis  . Back Pain  . Ankle Pain  . Abdominal Pain    HPI [name] is a 35 y.o. male.  HPI Patient denies he's had sickle cell pain for 3 days. It's around both sides of his ribs on the bottoms. He reports he feels short of breath. No fever and no productive cough. Home medications are not working. No lower extremity pain or swelling. Past Medical History:  Diagnosis Date  . Acute chest syndrome ([place]) 06/18/2013  . Acute embolism and thrombosis of right internal jugular vein ([place])   . Alcohol consumption of one to four drinks per day   . Avascular necrosis (HCC)    Right Hip  . Blood transfusion   . Chronic anticoagulation   . Demand ischemia ([place]) 01/02/2014  . Former smoker   . Functional asplenia   . Hb-SS disease with crisis ([place])   . History of Clostridium difficile infection   . History of pulmonary embolus (PE)   . Hypertension   . Hypokalemia   . Leukocytosis    Chronic  . Mood disorder ([place])   . Noncompliance with medication regimen   . Oxygen deficiency   . Pulmonary hypertension   . Second hand tobacco smoke exposure   . Sickle cell anemia (HCC)   . Sickle-cell crisis with associated acute chest syndrome ([place]) 05/13/2013  . Stroke ([place])   . Thrombocytosis (HCC)    Chronic  . Uses marijuana     Patient Active Problem List   Diagnosis Date Noted  . Abdominal pain   . Colitis 12/15/2015  . Slurred speech 11/10/2015  . Pulmonary emboli ([place]) 11/10/2015  . Aphasia   . Acute chest syndrome ([place])   . Ischemic stroke ([place])   . Sickle cell crisis ([place]) 10/27/2015  . Sickle cell pain crisis ([place]) 09/18/2015  . Sickle cell anemia with crisis ([place]) 09/04/2015  . Shortness of breath   . Pulmonary embolism without acute cor pulmonale ([place])   . Fever   . Sickle cell  anemia with pain ([place]) 05/07/2015  . Acute kidney injury ([place])   . Hypoxia   . Anemia 01/31/2015  . Chronic anemia   . Hb-SS disease without crisis ([place]) 10/07/2014  . Acute on chronic respiratory failure with hypoxia ([place])   . PVC's (premature ventricular contractions) 09/10/2014  . Abnormal EKG 09/10/2014  . Chronic atrial fibrillation ([place]) 09/09/2014  . Cor pulmonale, chronic ([place]) 08/25/2014  . Sickle cell anemia ([place]) 06/25/2014  . Anemia of chronic disease 06/25/2014  . PAH (pulmonary artery hypertension) 03/18/2014  . Hb-SS disease with crisis ([place]) 01/22/2014  . Pulmonary embolus ([place]) 01/01/2014  . Paralytic strabismus, external ophthalmoplegia   . Chronic pain syndrome 12/12/2013  . Elevated troponin 11/26/2013  . Chronic anticoagulation 08/22/2013  . Essential hypertension 08/22/2013  . Pulmonary HTN 06/18/2013  . Functional asplenia   . Vitamin D deficiency 02/13/2013  . Hx of pulmonary embolus 06/29/2012  . Hemochromatosis 12/14/2011  . Avascular necrosis [facility])     Past Surgical History:  Procedure Laterality Date  . CHOLECYSTECTOMY     01/2008  . Excision of left periauricular cyst     10/2009  . Excision of right ear lobe cyst with primary closur     11/2007  . [place] cath  placement    . Porta cath removal    . PORTACATH PLACEMENT  01/05/2012   Procedure: INSERTION PORT-A-CATH;  Surgeon: Odis Hollingshead, MD;  Location: [facility];  Service: General;  Laterality: N/A;  ultrasound guiced port a cath insertion with fluoroscopy  . Right hip replacement     08/2006  . UMBILICAL HERNIA REPAIR     01/2008       Home Medications    Prior to Admission medications   Medication Sig Start Date End Date Taking? Authorizing Provider  ambrisentan (LETAIRIS) 5 MG tablet Take 5 mg by mouth at bedtime.   Yes Historical Provider, MD  aspirin 81 MG chewable tablet Chew 1 tablet (81 mg total) by mouth daily. 07/23/14  Yes [name], MD  cholecalciferol (VITAMIN D)  1000 units tablet Take 2,000 Units by mouth daily.   Yes Historical Provider, MD  Deferasirox (JADENU) 360 MG TABS Take 1,080 mg by mouth daily.    Yes Historical Provider, MD  folic acid (FOLVITE) 1 MG tablet Take 1 mg by mouth daily.   Yes Historical Provider, MD  gabapentin (NEURONTIN) 300 MG capsule Take 1 capsule (300 mg total) by mouth 3 (three) times daily. 06/01/15  Yes Tresa Garter, MD  HYDROmorphone (DILAUDID) 4 MG tablet Take 1 tablet (4 mg total) by mouth every 4 (four) hours as needed for moderate pain or severe pain. 12/29/15 01/13/16 Yes Olugbemiga Essie Christine, MD  hydroxyurea (HYDREA) 500 MG capsule Take 2 capsules (1,000 mg total) by mouth daily. May take with food to minimize GI side effects. Hold Hydrea until evaluated by Primary Provider or Hematologist. 12/22/15  Yes [name], MD  lisinopril (PRINIVIL,ZESTRIL) 5 MG tablet Take 1 tablet (5 mg total) by mouth daily. 08/04/15  Yes [name], MD  morphine (MS CONTIN) 30 MG 12 hr tablet Take 1 tablet (30 mg total) by mouth every 12 (twelve) hours. 12/29/15 01/28/16 Yes Olugbemiga Essie Christine, MD  potassium chloride SA (K-DUR,KLOR-CON) 20 MEQ tablet Take 2 tablets (40 mEq total) by mouth daily. 12/23/15  Yes [name], MD  rivaroxaban (XARELTO) 20 MG TABS tablet Take 1 tablet (20 mg total) by mouth at bedtime. 12/29/15  Yes Tresa Garter, MD  torsemide (DEMADEX) 20 MG tablet Take 20 mg by mouth daily as needed (for weight gain greater than 3lbs.).    Yes Historical Provider, MD  zolpidem (AMBIEN) 10 MG tablet Take 1 tablet (10 mg total) by mouth at bedtime as needed for sleep. 12/29/15 01/28/16 Yes Tresa Garter, MD    Family History Family History  Problem Relation Age of Onset  . Sickle cell trait Mother   . Depression Mother   . Diabetes Mother   . Sickle cell trait Father   . Sickle cell trait Brother     Social History Social History  Substance Use Topics  . Smoking status: Former  Smoker    Packs/day: 0.50    Years: 10.00    Types: Cigarettes    Quit date: 05/29/2011  . Smokeless tobacco: Never Used  . Alcohol use No     Allergies   Patient has no known allergies.   Review of Systems Review of Systems 10 Systems reviewed and are negative for acute change except as noted in the HPI.  Physical Exam Updated Vital Signs BP 103/72 (BP Location: Right Arm)   Pulse 101   Temp 97.5 F (36.4 C) (Oral)   Resp 18  SpO2 92%   Physical Exam  Constitutional: He is oriented to person, place, and time. He appears well-developed and well-nourished.  HENT:  Head: Normocephalic and atraumatic.  Mouth/Throat: Oropharynx is clear and moist.  Eyes: Conjunctivae and EOM are normal. Pupils are equal, round, and reactive to light.  Neck: Neck supple.  Cardiovascular: Normal rate and regular rhythm.   No murmur heard. Pulmonary/Chest: Effort normal and breath sounds normal. No respiratory distress.  Abdominal: Soft. There is no tenderness.  Musculoskeletal: He exhibits no edema or tenderness.  Neurological: He is alert and oriented to person, place, and time. He exhibits normal muscle tone. Coordination normal.  Skin: Skin is warm and dry.  Psychiatric: He has a normal mood and affect.  Nursing note and vitals reviewed.    ED Treatments / Results  Labs (all labs ordered are listed, but only abnormal results are displayed) Labs Reviewed  COMPREHENSIVE METABOLIC PANEL - Abnormal; Notable for the following:       Result Value   Potassium 3.1 (*)    Glucose, Bld 126 (*)    Calcium 8.7 (*)    Total Bilirubin 5.7 (*)    All other components within normal limits  CBC WITH DIFFERENTIAL/PLATELET - Abnormal; Notable for the following:    WBC 14.3 (*)    RBC 2.82 (*)    Hemoglobin 8.6 (*)    HCT 25.4 (*)    RDW 21.6 (*)    Platelets 580 (*)    Neutro Abs 9.9 (*)    Monocytes Absolute 1.9 (*)    All other components within normal limits  RETICULOCYTES - Abnormal;  Notable for the following:    Retic Ct Pct 10.1 (*)    RBC. 2.82 (*)    Retic Count, Manual 284.8 (*)    All other components within normal limits  PROTIME-INR - Abnormal; Notable for the following:    Prothrombin Time 16.8 (*)    All other components within normal limits  I-STAT CG4 LACTIC ACID, ED    EKG  EKG Interpretation None       Radiology Dg Chest 2 View  Result Date: 01/03/2016 CLINICAL DATA:  Chest and back pain for 5 days.  Sickle cell anemia. EXAM: CHEST  2 VIEW COMPARISON:  01/01/2016 FINDINGS: Mild to moderate cardiomegaly remains stable. Left-sided power port remains in appropriate position. Chronic pulmonary vascular congestion is again demonstrated. No evidence of acute infiltrate or pulmonary edema. No evidence of pleural effusion. IMPRESSION: Stable cardiomegaly and pulmonary venous hypertension. No acute findings. Electronically Signed   By: Earle Gell M.D.   On: 01/03/2016 15:18    Procedures Procedures (including critical care time)  Medications Ordered in ED Medications  dextrose 5 %-0.45 % sodium chloride infusion ( Intravenous New Bag/Given 01/03/16 1502)  ondansetron (ZOFRAN) injection 4 mg (4 mg Intravenous Given 01/03/16 1504)  HYDROmorphone (DILAUDID) injection 2 mg (2 mg Intravenous Not Given 01/03/16 1554)    Or  HYDROmorphone (DILAUDID) injection 2 mg ( Subcutaneous See Alternative 01/03/16 1554)  HYDROmorphone (DILAUDID) injection 2 mg (not administered)    Or  HYDROmorphone (DILAUDID) injection 2 mg (not administered)  diphenhydrAMINE (BENADRYL) injection 25 mg (25 mg Intravenous Given 01/03/16 1503)  HYDROmorphone (DILAUDID) injection 2 mg (2 mg Intravenous Given 01/03/16 1552)    Or  HYDROmorphone (DILAUDID) injection 2 mg ( Subcutaneous See Alternative 01/03/16 1552)  HYDROmorphone (DILAUDID) injection 2 mg (2 mg Intravenous Given 01/03/16 1505)    Or  HYDROmorphone (DILAUDID) injection 2 mg (  Subcutaneous See Alternative 01/03/16 1505)      Initial Impression / Assessment and Plan / ED Course  I have reviewed the triage vital signs and the nursing notes.  Pertinent labs & imaging results that were available during my care of the patient were reviewed by me and considered in my medical decision making (see chart for details).  Clinical Course     Final Clinical Impressions(s) / ED Diagnoses   Final diagnoses:  Sickle cell pain crisis [facility])   Patient is being managed for pain control. He cites ongoing problems with chest pain for several days duration. Patient is seen frequently for similar presentation with Sickle Cell pain crisis. At this time, clinically he is alert and without respiratory distress with stable vital signs. Plan be to treat for pain and Dr. Loma Sousa to reassess for final determination for admission versus discharge. New Prescriptions New Prescriptions   No medications on file     Charlesetta Shanks, MD 01/03/16 1555

## 2016-01-03 NOTE — ED Provider Notes (Signed)
Pt visit shared.  Pt with sickle cell, here with pain typical for his pain crisis.  Labs are near his baseline and he feels improved following meds in ED.  Plan to d/c home with outpatient follow up and return precautions.     Quintella Reichert, MD 01/03/16 1901

## 2016-01-04 ENCOUNTER — Telehealth (HOSPITAL_COMMUNITY): Payer: Self-pay | Admitting: *Deleted

## 2016-01-04 DIAGNOSIS — Z79891 Long term (current) use of opiate analgesic: Secondary | ICD-10-CM | POA: Diagnosis not present

## 2016-01-04 DIAGNOSIS — D5700 Hb-SS disease with crisis, unspecified: Secondary | ICD-10-CM | POA: Diagnosis not present

## 2016-01-04 DIAGNOSIS — I081 Rheumatic disorders of both mitral and tricuspid valves: Secondary | ICD-10-CM | POA: Diagnosis not present

## 2016-01-04 DIAGNOSIS — M549 Dorsalgia, unspecified: Secondary | ICD-10-CM | POA: Diagnosis not present

## 2016-01-04 DIAGNOSIS — I2782 Chronic pulmonary embolism: Secondary | ICD-10-CM | POA: Diagnosis not present

## 2016-01-04 DIAGNOSIS — Z8673 Personal history of transient ischemic attack (TIA), and cerebral infarction without residual deficits: Secondary | ICD-10-CM | POA: Diagnosis not present

## 2016-01-04 DIAGNOSIS — I10 Essential (primary) hypertension: Secondary | ICD-10-CM | POA: Diagnosis not present

## 2016-01-04 DIAGNOSIS — Z7901 Long term (current) use of anticoagulants: Secondary | ICD-10-CM | POA: Diagnosis not present

## 2016-01-04 DIAGNOSIS — I2722 Pulmonary hypertension due to left heart disease: Secondary | ICD-10-CM | POA: Diagnosis not present

## 2016-01-04 DIAGNOSIS — R079 Chest pain, unspecified: Secondary | ICD-10-CM | POA: Diagnosis not present

## 2016-01-04 DIAGNOSIS — E559 Vitamin D deficiency, unspecified: Secondary | ICD-10-CM | POA: Diagnosis not present

## 2016-01-04 DIAGNOSIS — R0781 Pleurodynia: Secondary | ICD-10-CM | POA: Diagnosis not present

## 2016-01-04 DIAGNOSIS — Z96641 Presence of right artificial hip joint: Secondary | ICD-10-CM | POA: Diagnosis not present

## 2016-01-04 DIAGNOSIS — J40 Bronchitis, not specified as acute or chronic: Secondary | ICD-10-CM | POA: Diagnosis not present

## 2016-01-04 NOTE — Telephone Encounter (Signed)
Called complaining of 8/10 side and back pain. Denies chest pain, fever, abdominal pain, diarrhea or N/V. Took MS contin at 6:30 and Dilaudid at 6:30 and and 9 am without relief. Will notifiy provider and call patient back. Pt. Aware.

## 2016-01-04 NOTE — Telephone Encounter (Signed)
Advised patient per Dr. Doreene Burke to hydrate well, continue taking pain medication and rest. If he absolutely has to he can seek treatment in the ED. To keep primary provider follow up appointment. Patient says he doesn't have an appointment. Connected him with office for follow up appointment.

## 2016-01-04 NOTE — Discharge Summary (Signed)
Physician Discharge Summary  [name] [id number] DOB: [birth year] DOA: 01/01/2016  PCP: [name], MD  Admit date: 01/01/2016  Discharge date: 01/01/2016  Time spent: 30 minutes  Discharge Diagnoses:  Active Problems:   Sickle cell anemia with crisis [name])  Discharge Condition: Stable  Diet recommendation: Regular  Filed Weights   01/01/16 0900  Weight: 183 lb (83 kg)   History of present illness:  [name] is a 35 y.o. male with history of sickle cell disease with associated multiple complications including multiple PEs on chronic anticoagulations with doubtful adherence, Pulm HTN on home oxygen, non-adherent to oxygen use, who was transitioned to the [facility] this morningwith complaint of9/10 pain in his bilateral legs, back and bilateral rib. He was seen in the ED this morning for the same pain which he says is typical of his sickle cell related pain, managed briefly and transitioned to the day hospital for extended observation and pain management. Denies fever, chest pain, N/V, Diarrhea, abdominal pain or priapism.   Hospital Course:  BRADY MUSSEN was admitted to the day hospital with sickle cell painful crisis. Patient was treated with weight based IV Dilaudid PCA, IV Toradol as well as IV fluids. Mateen showed significant improvement symptomatically, pain improved from 9 to 5/10 at the time of discharge. Patient was discharged home in a hemodynamically stable condition. Shadrach will follow-up at the clinic as previously scheduled, continue with home medications as per prior to admission.  Discharge Instructions We discussed the need for good hydration, monitoring of hydration status, avoidance of heat, cold, stress, and infection triggers. We discussed the need to be compliant with taking Hydrea. Vedad was reminded of the need to seek medical attention of any symptoms of bleeding, anemia, or infection occurs.  Discharge Exam: Vitals:    01/01/16 1243 01/01/16 1355  BP: 107/75 121/81  Pulse: 88 100  Resp: 20 20  Temp:      General appearance: alert, cooperative and no distress Eyes: conjunctivae/corneas clear. PERRL, EOM's intact. Fundi benign. Neck: no adenopathy, no carotid bruit, no JVD, supple, symmetrical, trachea midline and thyroid not enlarged, symmetric, no tenderness/mass/nodules Back: symmetric, no curvature. ROM normal. No CVA tenderness. Resp: clear to auscultation bilaterally Chest wall: no tenderness Cardio: regular rate and rhythm, S1, S2, loud P2, + systolic murmur, no click, rub or gallop GI: soft, non-tender; bowel sounds normal; no masses, no organomegaly Extremities: extremities normal, atraumatic, no cyanosis or edema Pulses: 2+ and symmetric Skin: Skin color, texture, turgor normal. No rashes or lesions Neurologic: Grossly normal   Discharge Medication List as of 01/01/2016  2:33 PM    CONTINUE these medications which have NOT CHANGED   Details  ambrisentan (LETAIRIS) 5 MG tablet Take 5 mg by mouth at bedtime., Historical Med    aspirin 81 MG chewable tablet Chew 1 tablet (81 mg total) by mouth daily., Starting 07/23/2014, Until Discontinued, Normal    cholecalciferol (VITAMIN D) 1000 units tablet Take 2,000 Units by mouth daily., Historical Med    Deferasirox (JADENU) 360 MG TABS Take 1,080 mg by mouth daily. , Historical Med    folic acid (FOLVITE) 1 MG tablet Take 1 mg by mouth daily., Historical Med    gabapentin (NEURONTIN) 300 MG capsule Take 1 capsule (300 mg total) by mouth 3 (three) times daily., Starting 06/01/2015, Until Discontinued, Print    HYDROmorphone (DILAUDID) 4 MG tablet Take 1 tablet (4 mg total) by mouth every 4 (four) hours as needed for moderate pain  or severe pain., Starting Tue 12/29/2015, Until Wed 01/13/2016, Print    hydroxyurea (HYDREA) 500 MG capsule Take 2 capsules (1,000 mg total) by mouth daily. May take with food to minimize GI side effects. Hold Hydrea  until evaluated by Primary Provider or Hematologist., Starting Tue 12/22/2015, No Print    lisinopril (PRINIVIL,ZESTRIL) 5 MG tablet Take 1 tablet (5 mg total) by mouth daily., Starting Tue 08/04/2015, Normal    morphine (MS CONTIN) 30 MG 12 hr tablet Take 1 tablet (30 mg total) by mouth every 12 (twelve) hours., Starting Tue 12/29/2015, Until Thu 01/28/2016, Print    potassium chloride SA (K-DUR,KLOR-CON) 20 MEQ tablet Take 2 tablets (40 mEq total) by mouth daily., Starting Wed 12/23/2015, Normal    rivaroxaban (XARELTO) 20 MG TABS tablet Take 1 tablet (20 mg total) by mouth at bedtime., Starting Tue 12/29/2015, Normal    torsemide (DEMADEX) 20 MG tablet Take 20 mg by mouth daily as needed (for weight gain greater than 3lbs.). , Historical Med    zolpidem (AMBIEN) 10 MG tablet Take 1 tablet (10 mg total) by mouth at bedtime as needed for sleep., Starting Tue 12/29/2015, Until Thu 01/28/2016, Print       No Known Allergies   Significant Diagnostic Studies: Dg Chest 2 View  Result Date: 01/03/2016 CLINICAL DATA:  Chest and back pain for 5 days.  Sickle cell anemia. EXAM: CHEST  2 VIEW COMPARISON:  01/01/2016 FINDINGS: Mild to moderate cardiomegaly remains stable. Left-sided power port remains in appropriate position. Chronic pulmonary vascular congestion is again demonstrated. No evidence of acute infiltrate or pulmonary edema. No evidence of pleural effusion. IMPRESSION: Stable cardiomegaly and pulmonary venous hypertension. No acute findings. Electronically Signed   By: Earle Gell M.D.   On: 01/03/2016 15:18   Dg Chest 2 View  Result Date: 01/01/2016 CLINICAL DATA:  Sickle cell pain crisis. Bilateral anterior rib pain. Initial encounter. EXAM: CHEST  2 VIEW COMPARISON:  Chest radiograph performed 12/27/2015 FINDINGS: The lungs are well-aerated and clear. There is no evidence of focal opacification, pleural effusion or pneumothorax. The heart is mildly enlarged. A left-sided chest port is  noted ending about the distal SVC. No acute osseous abnormalities are seen. IMPRESSION: Mild cardiomegaly. Lungs remain grossly clear. No displaced rib fracture seen. Electronically Signed   By: Garald Balding M.D.   On: 01/01/2016 02:32   Dg Chest 2 View  Result Date: 12/27/2015 CLINICAL DATA:  Bilateral rib pain since 3 p.m. today. History of sickle cell. History of acute chest syndrome. EXAM: CHEST  2 VIEW COMPARISON:  12/21/2015 FINDINGS: Power port type central venous catheter with tip over the cavoatrial junction region. No pneumothorax. No change in position since prior study. Cardiac enlargement without vascular congestion. No focal consolidation or edema. No blunting of costophrenic angles. No pneumothorax. Mediastinal contours appear intact. IMPRESSION: Cardiac enlargement. No evidence of active pulmonary disease. No focal consolidation. Electronically Signed   By: Lucienne Capers M.D.   On: 12/27/2015 06:47   Dg Chest 2 View  Result Date: 12/21/2015 CLINICAL DATA:  Hypoxia with shortness of breath history of sickle cell EXAM: CHEST  2 VIEW COMPARISON:  11/27/2015 FINDINGS: A left-sided central venous port tip overlies the distal SVC. Moderate-to-marked cardiomegaly is similar compared to the prior study. No effusion. No consolidation. No pneumothorax. Small amount of atelectasis at the left base. IMPRESSION: Cardiomegaly without acute infiltrate or overt pulmonary edema. Electronically Signed   By: Donavan Foil M.D.   On: 12/21/2015 15:36  Dg Abd 1 View  Result Date: 12/16/2015 CLINICAL DATA:  Acute abdominal pain.  Nausea and vomiting. EXAM: ABDOMEN - 1 VIEW COMPARISON:  CT yesterday FINDINGS: Enteric contrast from CT yesterday in the distal transverse and descending colon. Small to moderate stool burden. No bowel dilatation to suggest obstruction. No evidence of free air on portable supine view. Cholecystectomy clips in the right upper quadrant of the abdomen. Right hip arthroplasty in  place. IMPRESSION: No evidence of bowel obstruction or free air. Enteric contrast from CT yesterday in the distal transverse and descending colon. Small to moderate stool burden. Electronically Signed   By: Jeb Levering M.D.   On: 12/16/2015 17:35   Ct Abdomen Pelvis W Contrast  Result Date: 12/15/2015 CLINICAL DATA:  Upper abdominal pain, nausea, vomiting since 7 a.m. EXAM: CT ABDOMEN AND PELVIS WITH CONTRAST TECHNIQUE: Multidetector CT imaging of the abdomen and pelvis was performed using the standard protocol following bolus administration of intravenous contrast. CONTRAST:  115mL ISOVUE-300 IOPAMIDOL (ISOVUE-300) INJECTION 61% COMPARISON:  06/03/2015 FINDINGS: Lower chest: Cardiomegaly.  Mild lingular and bibasilar atelectasis. Hepatobiliary: No focal liver abnormality is seen. Status post cholecystectomy. No biliary dilatation. Pancreas: Unremarkable. No pancreatic ductal dilatation or surrounding inflammatory changes. Spleen: Extensive chronic splenic infarction with calcification. Adrenals/Urinary Tract: Adrenal glands are unremarkable. Kidneys are normal, without renal calculi, focal lesion, or hydronephrosis. Bladder is unremarkable. Stomach/Bowel: Bowel wall thickening of the ascending and transverse colon concerning for colitis. No pneumatosis, pneumoperitoneum or portal venous gas. No bowel obstruction. Vascular/Lymphatic: Normal caliber abdominal aorta with atherosclerosis. No lymphadenopathy. Reproductive: Prostate is unremarkable. Other: No fluid collection or hematoma.  No abdominal wall hernia. Musculoskeletal: No acute osseous abnormality. No aggressive is lytic or sclerotic osseous lesion. Mild osteosclerosis consistent with sickle cell anemia. Right total hip arthroplasty without failure complication. Mild subchondral sclerosis in the left femoral head concerning for avascular necrosis. IMPRESSION: 1. Bowel wall thickening of the ascending and transverse colon concerning for colitis  which may be secondary to an infectious, inflammatory or ischemic etiology given the patient's history of sickle cell anemia. 2. Mild subchondral sclerosis in the left femoral head concerning for avascular necrosis. 3. Stable cardiomegaly. Electronically Signed   By: Kathreen Devoid   On: 12/15/2015 12:57    Signed:  [name] MD, [place], [place], [place], CPE   01/04/2016, 12:58 PM

## 2016-01-05 DIAGNOSIS — Z86711 Personal history of pulmonary embolism: Secondary | ICD-10-CM | POA: Diagnosis not present

## 2016-01-05 DIAGNOSIS — D5700 Hb-SS disease with crisis, unspecified: Secondary | ICD-10-CM | POA: Diagnosis not present

## 2016-01-05 DIAGNOSIS — Z79891 Long term (current) use of opiate analgesic: Secondary | ICD-10-CM | POA: Diagnosis not present

## 2016-01-05 DIAGNOSIS — R0781 Pleurodynia: Secondary | ICD-10-CM | POA: Diagnosis not present

## 2016-01-05 DIAGNOSIS — Z8673 Personal history of transient ischemic attack (TIA), and cerebral infarction without residual deficits: Secondary | ICD-10-CM | POA: Diagnosis not present

## 2016-01-05 DIAGNOSIS — R079 Chest pain, unspecified: Secondary | ICD-10-CM | POA: Diagnosis not present

## 2016-01-05 DIAGNOSIS — J40 Bronchitis, not specified as acute or chronic: Secondary | ICD-10-CM | POA: Diagnosis present

## 2016-01-05 DIAGNOSIS — Z7901 Long term (current) use of anticoagulants: Secondary | ICD-10-CM | POA: Diagnosis not present

## 2016-01-05 DIAGNOSIS — I2722 Pulmonary hypertension due to left heart disease: Secondary | ICD-10-CM | POA: Diagnosis present

## 2016-01-05 DIAGNOSIS — I2782 Chronic pulmonary embolism: Secondary | ICD-10-CM | POA: Diagnosis present

## 2016-01-05 DIAGNOSIS — I10 Essential (primary) hypertension: Secondary | ICD-10-CM | POA: Diagnosis not present

## 2016-01-05 DIAGNOSIS — I361 Nonrheumatic tricuspid (valve) insufficiency: Secondary | ICD-10-CM | POA: Diagnosis not present

## 2016-01-05 DIAGNOSIS — E559 Vitamin D deficiency, unspecified: Secondary | ICD-10-CM | POA: Diagnosis not present

## 2016-01-05 DIAGNOSIS — D649 Anemia, unspecified: Secondary | ICD-10-CM | POA: Diagnosis not present

## 2016-01-05 DIAGNOSIS — I081 Rheumatic disorders of both mitral and tricuspid valves: Secondary | ICD-10-CM | POA: Diagnosis present

## 2016-01-05 DIAGNOSIS — I2729 Other secondary pulmonary hypertension: Secondary | ICD-10-CM | POA: Diagnosis not present

## 2016-01-05 DIAGNOSIS — D72829 Elevated white blood cell count, unspecified: Secondary | ICD-10-CM | POA: Diagnosis not present

## 2016-01-05 DIAGNOSIS — Z96641 Presence of right artificial hip joint: Secondary | ICD-10-CM | POA: Diagnosis present

## 2016-01-12 ENCOUNTER — Ambulatory Visit: Payer: Self-pay | Admitting: Internal Medicine

## 2016-01-13 ENCOUNTER — Encounter (HOSPITAL_COMMUNITY): Payer: Self-pay | Admitting: *Deleted

## 2016-01-13 ENCOUNTER — Emergency Department (HOSPITAL_COMMUNITY)
Admission: EM | Admit: 2016-01-13 | Discharge: 2016-01-13 | Disposition: A | Discharge status: Home / Self Care | Payer: Medicare Other | Attending: Emergency Medicine | Admitting: Emergency Medicine

## 2016-01-13 ENCOUNTER — Non-Acute Institutional Stay (HOSPITAL_BASED_OUTPATIENT_CLINIC_OR_DEPARTMENT_OTHER)
Admission: AD | Admit: 2016-01-13 | Discharge: 2016-01-13 | Disposition: A | Discharge status: Home / Self Care | Payer: Medicare Other | Source: Ambulatory Visit | Attending: Internal Medicine | Admitting: Internal Medicine

## 2016-01-13 ENCOUNTER — Encounter (HOSPITAL_COMMUNITY): Payer: Self-pay | Admitting: Emergency Medicine

## 2016-01-13 DIAGNOSIS — Z86711 Personal history of pulmonary embolism: Secondary | ICD-10-CM | POA: Insufficient documentation

## 2016-01-13 DIAGNOSIS — Z8673 Personal history of transient ischemic attack (TIA), and cerebral infarction without residual deficits: Secondary | ICD-10-CM | POA: Insufficient documentation

## 2016-01-13 DIAGNOSIS — Z7901 Long term (current) use of anticoagulants: Secondary | ICD-10-CM | POA: Insufficient documentation

## 2016-01-13 DIAGNOSIS — Z79899 Other long term (current) drug therapy: Secondary | ICD-10-CM | POA: Insufficient documentation

## 2016-01-13 DIAGNOSIS — Z86718 Personal history of other venous thrombosis and embolism: Secondary | ICD-10-CM

## 2016-01-13 DIAGNOSIS — Z7982 Long term (current) use of aspirin: Secondary | ICD-10-CM | POA: Insufficient documentation

## 2016-01-13 DIAGNOSIS — I10 Essential (primary) hypertension: Secondary | ICD-10-CM | POA: Insufficient documentation

## 2016-01-13 DIAGNOSIS — Z87891 Personal history of nicotine dependence: Secondary | ICD-10-CM | POA: Insufficient documentation

## 2016-01-13 DIAGNOSIS — Z79891 Long term (current) use of opiate analgesic: Secondary | ICD-10-CM | POA: Insufficient documentation

## 2016-01-13 DIAGNOSIS — Z96641 Presence of right artificial hip joint: Secondary | ICD-10-CM | POA: Insufficient documentation

## 2016-01-13 DIAGNOSIS — D5700 Hb-SS disease with crisis, unspecified: Secondary | ICD-10-CM | POA: Diagnosis not present

## 2016-01-13 LAB — RETICULOCYTES
RBC.: 2.62 MIL/uL — AB (ref 4.22–5.81)
RETIC COUNT ABSOLUTE: 154.6 10*3/uL (ref 19.0–186.0)
Retic Ct Pct: 5.9 % — ABNORMAL HIGH (ref 0.4–3.1)

## 2016-01-13 LAB — CBC WITH DIFFERENTIAL/PLATELET
Basophils Absolute: 0.1 10*3/uL (ref 0.0–0.1)
Basophils Relative: 1 %
EOS PCT: 3 %
Eosinophils Absolute: 0.3 10*3/uL (ref 0.0–0.7)
HEMATOCRIT: 23 % — AB (ref 39.0–52.0)
HEMOGLOBIN: 7.9 g/dL — AB (ref 13.0–17.0)
LYMPHS ABS: 1.5 10*3/uL (ref 0.7–4.0)
LYMPHS PCT: 13 %
MCH: 30.2 pg (ref 26.0–34.0)
MCHC: 34.3 g/dL (ref 30.0–36.0)
MCV: 87.8 fL (ref 78.0–100.0)
MONOS PCT: 10 %
Monocytes Absolute: 1.1 10*3/uL — ABNORMAL HIGH (ref 0.1–1.0)
Neutro Abs: 8.2 10*3/uL — ABNORMAL HIGH (ref 1.7–7.7)
Neutrophils Relative %: 73 %
Platelets: 375 10*3/uL (ref 150–400)
RBC: 2.62 MIL/uL — AB (ref 4.22–5.81)
RDW: 21.1 % — AB (ref 11.5–15.5)
WBC: 11.2 10*3/uL — AB (ref 4.0–10.5)

## 2016-01-13 LAB — COMPREHENSIVE METABOLIC PANEL
ALBUMIN: 4.4 g/dL (ref 3.5–5.0)
ALT: 36 U/L (ref 17–63)
ANION GAP: 5 (ref 5–15)
AST: 55 U/L — AB (ref 15–41)
Alkaline Phosphatase: 88 U/L (ref 38–126)
BUN: 27 mg/dL — AB (ref 6–20)
CHLORIDE: 110 mmol/L (ref 101–111)
CO2: 22 mmol/L (ref 22–32)
Calcium: 8.9 mg/dL (ref 8.9–10.3)
Creatinine, Ser: 1.03 mg/dL (ref 0.61–1.24)
GFR calc Af Amer: >60 mL/min (ref >60)
GLUCOSE: 113 mg/dL — AB (ref 65–99)
POTASSIUM: 4.4 mmol/L (ref 3.5–5.1)
Sodium: 137 mmol/L (ref 135–145)
Total Bilirubin: 5.2 mg/dL — ABNORMAL HIGH (ref 0.3–1.2)
Total Protein: 7.6 g/dL (ref 6.5–8.1)

## 2016-01-13 MED ORDER — HYDROMORPHONE HCL 2 MG/ML IJ SOLN
2.0000 mg | INTRAMUSCULAR | Status: AC
  Administered 2016-01-13: 2 mg via INTRAVENOUS
  Filled 2016-01-13: qty 1

## 2016-01-13 MED ORDER — KETOROLAC TROMETHAMINE 30 MG/ML IJ SOLN
30.0000 mg | Freq: Once | INTRAMUSCULAR | Status: AC
  Administered 2016-01-13: 30 mg via INTRAVENOUS
  Filled 2016-01-13: qty 1

## 2016-01-13 MED ORDER — HYDROMORPHONE HCL 2 MG/ML IJ SOLN: 2.0000 mg | INTRAMUSCULAR | Status: DC

## 2016-01-13 MED ORDER — NALOXONE HCL 0.4 MG/ML IJ SOLN: 0.4000 mg | INTRAMUSCULAR | Status: DC | PRN

## 2016-01-13 MED ORDER — DEXTROSE-NACL 5-0.45 % IV SOLN
INTRAVENOUS | Status: DC
  Administered 2016-01-13: 13:00:00 via INTRAVENOUS

## 2016-01-13 MED ORDER — SODIUM CHLORIDE 0.9% FLUSH: 9.0000 mL | INTRAVENOUS | Status: DC | PRN

## 2016-01-13 MED ORDER — HYDROMORPHONE 1 MG/ML IV SOLN
INTRAVENOUS | Status: DC
  Administered 2016-01-13: 10.5 mg via INTRAVENOUS
  Administered 2016-01-13: 13:00:00 via INTRAVENOUS
  Filled 2016-01-13: qty 25

## 2016-01-13 MED ORDER — DEXTROSE-NACL 5-0.45 % IV SOLN
INTRAVENOUS | Status: DC
  Administered 2016-01-13: 12:00:00 via INTRAVENOUS

## 2016-01-13 MED ORDER — DIPHENHYDRAMINE HCL 50 MG/ML IJ SOLN
25.0000 mg | INTRAMUSCULAR | Status: DC | PRN
  Filled 2016-01-13: qty 0.5

## 2016-01-13 MED ORDER — HYDROMORPHONE HCL 2 MG/ML IJ SOLN: 2.0000 mg | INTRAMUSCULAR | Status: AC

## 2016-01-13 MED ORDER — ONDANSETRON HCL 4 MG/2ML IJ SOLN: 4.0000 mg | Freq: Four times a day (QID) | INTRAMUSCULAR | Status: DC | PRN

## 2016-01-13 MED ORDER — SODIUM CHLORIDE 0.9% FLUSH: 10.0000 mL | INTRAVENOUS | Status: DC | PRN

## 2016-01-13 MED ORDER — DIPHENHYDRAMINE HCL 25 MG PO CAPS
25.0000 mg | ORAL_CAPSULE | ORAL | Status: DC | PRN
  Administered 2016-01-13: 25 mg via ORAL
  Filled 2016-01-13: qty 1

## 2016-01-13 MED ORDER — HYDROMORPHONE HCL 2 MG/ML IJ SOLN
4.0000 mg | INTRAMUSCULAR | Status: AC
  Administered 2016-01-13: 4 mg via INTRAVENOUS
  Filled 2016-01-13: qty 2

## 2016-01-13 MED ORDER — HEPARIN SOD (PORK) LOCK FLUSH 100 UNIT/ML IV SOLN
500.0000 [IU] | INTRAVENOUS | Status: DC | PRN
Start: 2016-01-13 — End: 2016-01-13
  Administered 2016-01-13: 500 [IU]
  Filled 2016-01-13: qty 5

## 2016-01-13 MED ORDER — DIPHENHYDRAMINE HCL 25 MG PO CAPS: 25.0000 mg | ORAL_CAPSULE | ORAL | Status: DC | PRN

## 2016-01-13 MED ORDER — HYDROMORPHONE HCL 2 MG/ML IJ SOLN: 4.0000 mg | INTRAMUSCULAR | Status: AC

## 2016-01-13 NOTE — ED Notes (Signed)
Sickle cell here to pick up pt. Pt will be discharge to sickle cell clinic.

## 2016-01-13 NOTE — ED Triage Notes (Signed)
Per pt, states back pain that started last night-states he called clinic and they told him to come to ED

## 2016-01-13 NOTE — H&P (Signed)
[facility] History and Physical   Date: 01/13/2016  Patient name: [name] Medical record number: [id number] Date of birth: Jan 04, 1981 Age: 35 y.o. Gender: male PCP: [name], MD  Attending physician: Tresa Garter, MD  Chief Complaint: Left flank pain  History of Present Illness: [name], a 35 year old male with a history of sickle cell anemia, HbSS presents with left flank pain. He states that pain started several days ago and is consistent with typical sickle cell pain. He has not identified any palliative or provocative factors. Current pain intensity is 8/10 to left flank. Patient was evaluated in the emergency department and transitioned to the day infusion center for further pain control. He received IV dilaudid in the emergency department without sustained relief. He currently denies headache, neuropathy, shortness of breath, dysuria, nausea, vomiting, or diarrhea.   Meds: Prescriptions Prior to Admission  Medication Sig Dispense Refill Last Dose  . ambrisentan (LETAIRIS) 5 MG tablet Take 5 mg by mouth at bedtime.   01/12/2016 at Unknown time  . aspirin 81 MG chewable tablet Chew 1 tablet (81 mg total) by mouth daily. 30 tablet 11 01/12/2016 at Unknown time  . cholecalciferol (VITAMIN D) 1000 units tablet Take 2,000 Units by mouth daily.   01/12/2016 at Unknown time  . Deferasirox (JADENU) 360 MG TABS Take 1,080 mg by mouth daily.    01/12/2016 at Unknown time  . folic acid (FOLVITE) 1 MG tablet Take 1 mg by mouth daily.   01/12/2016 at Unknown time  . gabapentin (NEURONTIN) 300 MG capsule Take 1 capsule (300 mg total) by mouth 3 (three) times daily. 270 capsule 3 01/12/2016 at Unknown time  . HYDROmorphone (DILAUDID) 4 MG tablet Take 1 tablet (4 mg total) by mouth every 4 (four) hours as needed for moderate pain or severe pain. 90 tablet 0 01/13/2016 at Unknown time  . hydroxyurea (HYDREA) 500 MG capsule Take 2 capsules (1,000 mg  total) by mouth daily. May take with food to minimize GI side effects. Hold Hydrea until evaluated by Primary Provider or Hematologist.   01/12/2016 at Unknown time  . lisinopril (PRINIVIL,ZESTRIL) 5 MG tablet Take 1 tablet (5 mg total) by mouth daily. 30 tablet 1 01/12/2016 at Unknown time  . morphine (MS CONTIN) 30 MG 12 hr tablet Take 1 tablet (30 mg total) by mouth every 12 (twelve) hours. 60 tablet 0 01/13/2016 at Unknown time  . potassium chloride SA (K-DUR,KLOR-CON) 20 MEQ tablet Take 2 tablets (40 mEq total) by mouth daily. 30 tablet 1 01/12/2016 at Unknown time  . rivaroxaban (XARELTO) 20 MG TABS tablet Take 1 tablet (20 mg total) by mouth at bedtime. 30 tablet 3 01/12/2016 at 2000  . torsemide (DEMADEX) 20 MG tablet Take 20 mg by mouth daily as needed (for weight gain greater than 3lbs.).    unknown  . zolpidem (AMBIEN) 10 MG tablet Take 1 tablet (10 mg total) by mouth at bedtime as needed for sleep. 30 tablet 0 01/12/2016 at Unknown time    Allergies: Patient has no known allergies. Past Medical History:  Diagnosis Date  . Acute chest syndrome ([place]) 06/18/2013  . Acute embolism and thrombosis of right internal jugular vein ([place])   . Alcohol consumption of one to four drinks per day   . Avascular necrosis (HCC)    Right Hip  . Blood transfusion   . Chronic anticoagulation   . Demand ischemia ([place]) 01/02/2014  . Former smoker   .  Functional asplenia   . Hb-SS disease with crisis ([place])   . History of Clostridium difficile infection   . History of pulmonary embolus (PE)   . Hypertension   . Hypokalemia   . Leukocytosis    Chronic  . Mood disorder ([place])   . Noncompliance with medication regimen   . Oxygen deficiency   . Pulmonary hypertension   . Second hand tobacco smoke exposure   . Sickle cell anemia (HCC)   . Sickle-cell crisis with associated acute chest syndrome ([place]) 05/13/2013  . Stroke ([place])   . Thrombocytosis (HCC)    Chronic  . Uses marijuana    Past Surgical  History:  Procedure Laterality Date  . CHOLECYSTECTOMY     01/2008  . Excision of left periauricular cyst     10/2009  . Excision of right ear lobe cyst with primary closur     11/2007  . Porta cath placement    . Porta cath removal    . PORTACATH PLACEMENT  01/05/2012   Procedure: INSERTION PORT-A-CATH;  Surgeon: Odis Hollingshead, MD;  Location: [facility];  Service: General;  Laterality: N/A;  ultrasound guiced port a cath insertion with fluoroscopy  . Right hip replacement     08/2006  . UMBILICAL HERNIA REPAIR     01/2008   Family History  Problem Relation Age of Onset  . Sickle cell trait Mother   . Depression Mother   . Diabetes Mother   . Sickle cell trait Father   . Sickle cell trait Brother    Social History   Social History  . Marital status: Single    Spouse name: N/A  . Number of children: 0  . Years of education: 51   Occupational History  . 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 Smoking status: Former Smoker    Packs/day: 0.50    Years: 10.00    Types: Cigarettes    Quit date: 05/29/2011  . Smokeless tobacco: Never Used  . Alcohol use No  . Drug use: No     Comment: Marijuana weekly  . Sexual activity: Yes    Partners: Female     Comment: month ago   Other Topics Concern  . Not on file   Social History Narrative   Lives in an apartment.  Single.  Lives alone but has a girlfriend that helps care for him.  Does not use any assist devices.        [name]  [phone] Mom, emergency contact      [facility]:   Patient continuing to live in her apartment in town alone. 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    Review of Systems: Eyes: positive for icterus Ears, nose, mouth, throat, and face: negative for earaches, hoarseness and nasal congestion Respiratory: negative for cough and dyspnea on exertion Cardiovascular: positive for fatigue Gastrointestinal: negative for  constipation and nausea Genitourinary:negative for dysuria Integument/breast: negative Hematologic/lymphatic: negative Musculoskeletal:positive for myalgias Neurological: negative Behavioral/Psych: negative Endocrine: negative  Physical Exam: There were no vitals taken for this visit. BP 126/68 (BP Location: Left Arm, Patient Position: Sitting)   Pulse 92   Temp 98.2 F (36.8 C) (Oral)   Resp 18   Ht 6' (1.829 m)   Wt 182 lb (82.6 kg)   SpO2 96%   BMI 24.68 kg/m   General Appearance:    Alert, cooperative, mild distress,  appears older thanstated age  Head:    Normocephalic, without obvious abnormality, atraumatic  Eyes:    PERRL, conjunctiva/corneas clear, EOM's intact, fundi    Benign, icterus both eyes       Ears:    Normal TM's and external ear canals, both ears  Neck:   Supple, symmetrical, trachea midline, no adenopathy;       thyroid:  No enlargement/tenderness/nodules; no carotid   bruit or JVD  Back:     Symmetric, no curvature, ROM normal, no CVA tenderness  Lungs:     Clear to auscultation bilaterally, respirations unlabored  Chest wall:    No tenderness or deformity  Heart:    Irregular rate and rhythm  Abdomen:     Soft, tender to palpation of left flank, bowel sounds active all four quadrants,    no masses, no organomegaly  Extremities:   Extremities normal, atraumatic, no cyanosis or edema  Pulses:   2+ and symmetric all extremities  Skin:   Skin color, texture, turgor normal, no rashes or lesions  Lymph nodes:   Cervical, supraclavicular, and axillary nodes normal  Neurologic:   CNII-XII intact. Normal strength, sensation and reflexes      throughout     Lab results:Results for orders placed or performed during the hospital encounter of 01/13/16 (from the past 24 hour(s))  Comprehensive metabolic panel     Status: Abnormal   Collection Time: 01/13/16 11:55 AM  Result Value Ref Range   Sodium 137 135 - 145 mmol/L   Potassium 4.4 3.5 - 5.1 mmol/L    Chloride 110 101 - 111 mmol/L   CO2 22 22 - 32 mmol/L   Glucose, Bld 113 (H) 65 - 99 mg/dL   BUN 27 (H) 6 - 20 mg/dL   Creatinine, Ser 1.03 0.61 - 1.24 mg/dL   Calcium 8.9 8.9 - 10.3 mg/dL   Total Protein 7.6 6.5 - 8.1 g/dL   Albumin 4.4 3.5 - 5.0 g/dL   AST 55 (H) 15 - 41 U/L   ALT 36 17 - 63 U/L   Alkaline Phosphatase 88 38 - 126 U/L   Total Bilirubin 5.2 (H) 0.3 - 1.2 mg/dL   GFR calc non Af Amer >60 >60 mL/min   GFR calc Af Amer >60 >60 mL/min   Anion gap 5 5 - 15  CBC with Differential     Status: Abnormal   Collection Time: 01/13/16 11:55 AM  Result Value Ref Range   WBC 11.2 (H) 4.0 - 10.5 K/uL   RBC 2.62 (L) 4.22 - 5.81 MIL/uL   Hemoglobin 7.9 (L) 13.0 - 17.0 g/dL   HCT 23.0 (L) 39.0 - 52.0 %   MCV 87.8 78.0 - 100.0 fL   MCH 30.2 26.0 - 34.0 pg   MCHC 34.3 30.0 - 36.0 g/dL   RDW 21.1 (H) 11.5 - 15.5 %   Platelets 375 150 - 400 K/uL   Neutrophils Relative % 73 %   Lymphocytes Relative 13 %   Monocytes Relative 10 %   Eosinophils Relative 3 %   Basophils Relative 1 %   Neutro Abs 8.2 (H) 1.7 - 7.7 K/uL   Lymphs Abs 1.5 0.7 - 4.0 K/uL   Monocytes Absolute 1.1 (H) 0.1 - 1.0 K/uL   Eosinophils Absolute 0.3 0.0 - 0.7 K/uL   Basophils Absolute 0.1 0.0 - 0.1 K/uL   RBC Morphology POLYCHROMASIA PRESENT   Reticulocytes     Status: Abnormal   Collection Time: 01/13/16 11:55  AM  Result Value Ref Range   Retic Ct Pct 5.9 (H) 0.4 - 3.1 %   RBC. 2.62 (L) 4.22 - 5.81 MIL/uL   Retic Count, Manual 154.6 19.0 - 186.0 K/uL   *Note: Due to a large number of results and/or encounters for the requested time period, some results have not been displayed. A complete set of results can be found in Results Review.    Imaging results:  No results found.   Assessment & Plan:  Patient will be admitted to the day infusion center for extended observation  Patient transitioned from emergency department  Start IV D5.45 for cellular rehydration at 100/hr  Start Dilaudid PCA High  Concentration per weight based protocol.   Patient will be re-evaluated for pain intensity in the context of function and relationship to baseline as care progresses.  If no significant pain relief, will transfer patient to inpatient services for a higher level of care.   Will review laboratory values   Kashius Dominic M 01/13/2016, 1:03 PM

## 2016-01-13 NOTE — Progress Notes (Signed)
Patient ID: [name], male   DOB: [birth year], 35 y.o.   MRN: [id number] Admitted to [facility] from the ED for complaints of 8/10 back pain. Treated with IV fluids, PCA Dilaudid. Pain level down to 4/10 at time of discharge. Went over discharge instructions with patient. Copy given to patient. Flushed port. Alert, oriented and ambulatory at time of discharge. Patient uses home oxygen but did not bring tank with him.

## 2016-01-13 NOTE — ED Provider Notes (Signed)
[facility] DEPT Provider Note   CSN: [id number] Arrival date & time: 01/13/16  1019     History   Chief Complaint Chief Complaint  Patient presents with  . Sickle Cell Pain Crisis    HPI [name] is a 35 y.o. male.  HPI   Side and back pain, started last night, 8/10 Similar to prior sickle cell Dilaudid and MS contin not helping No n/v/fevers Some abdominal pain, periumbilical  No dysuria/hematuria No CP/dyspnea  Past Medical History:  Diagnosis Date  . Acute chest syndrome ([place]) 06/18/2013  . Acute embolism and thrombosis of right internal jugular vein ([place])   . Alcohol consumption of one to four drinks per day   . Avascular necrosis (HCC)    Right Hip  . Blood transfusion   . Chronic anticoagulation   . Demand ischemia ([place]) 01/02/2014  . Former smoker   . Functional asplenia   . Hb-SS disease with crisis ([place])   . History of Clostridium difficile infection   . History of pulmonary embolus (PE)   . Hypertension   . Hypokalemia   . Leukocytosis    Chronic  . Mood disorder ([place])   . Noncompliance with medication regimen   . Oxygen deficiency   . Pulmonary hypertension   . Second hand tobacco smoke exposure   . Sickle cell anemia (HCC)   . Sickle-cell crisis with associated acute chest syndrome ([place]) 05/13/2013  . Stroke ([place])   . Thrombocytosis (HCC)    Chronic  . Uses marijuana     Patient Active Problem List   Diagnosis Date Noted  . Abdominal pain   . Colitis 12/15/2015  . Slurred speech 11/10/2015  . Pulmonary emboli ([place]) 11/10/2015  . Aphasia   . Acute chest syndrome ([place])   . Ischemic stroke ([place])   . Sickle cell crisis ([place]) 10/27/2015  . Sickle cell pain crisis ([place]) 09/18/2015  . Sickle cell anemia with crisis ([place]) 09/04/2015  . Shortness of breath   . Pulmonary embolism without acute cor pulmonale ([place])   . Fever   . Sickle cell anemia with pain ([place]) 05/07/2015  . Acute kidney injury ([place])   . Hypoxia   . Anemia  01/31/2015  . Chronic anemia   . Hb-SS disease without crisis ([place]) 10/07/2014  . Acute on chronic respiratory failure with hypoxia ([place])   . PVC's (premature ventricular contractions) 09/10/2014  . Abnormal EKG 09/10/2014  . Chronic atrial fibrillation ([place]) 09/09/2014  . Cor pulmonale, chronic ([place]) 08/25/2014  . Sickle cell anemia ([place]) 06/25/2014  . Anemia of chronic disease 06/25/2014  . PAH (pulmonary artery hypertension) 03/18/2014  . Hb-SS disease with crisis ([place]) 01/22/2014  . Pulmonary embolus ([place]) 01/01/2014  . Paralytic strabismus, external ophthalmoplegia   . Chronic pain syndrome 12/12/2013  . Elevated troponin 11/26/2013  . Chronic anticoagulation 08/22/2013  . Essential hypertension 08/22/2013  . Pulmonary HTN 06/18/2013  . Functional asplenia   . Vitamin D deficiency 02/13/2013  . Hx of pulmonary embolus 06/29/2012  . Hemochromatosis 12/14/2011  . Avascular necrosis [facility])     Past Surgical History:  Procedure Laterality Date  . CHOLECYSTECTOMY     01/2008  . Excision of left periauricular cyst     10/2009  . Excision of right ear lobe cyst with primary closur     11/2007  . Porta cath placement    . Porta cath removal    . PORTACATH PLACEMENT  01/05/2012   Procedure: INSERTION PORT-A-CATH;  Surgeon:  Odis Hollingshead, MD;  Location: [facility];  Service: General;  Laterality: N/A;  ultrasound guiced port a cath insertion with fluoroscopy  . Right hip replacement     08/2006  . UMBILICAL HERNIA REPAIR     01/2008       Home Medications    Prior to Admission medications   Medication Sig Start Date End Date Taking? Authorizing Provider  ambrisentan (LETAIRIS) 5 MG tablet Take 5 mg by mouth at bedtime.   Yes Historical Provider, MD  aspirin 81 MG chewable tablet Chew 1 tablet (81 mg total) by mouth daily. 07/23/14  Yes [name], MD  cholecalciferol (VITAMIN D) 1000 units tablet Take 2,000 Units by mouth daily.   Yes Historical Provider, MD    Deferasirox (JADENU) 360 MG TABS Take 1,080 mg by mouth daily.    Yes Historical Provider, MD  folic acid (FOLVITE) 1 MG tablet Take 1 mg by mouth daily.   Yes Historical Provider, MD  gabapentin (NEURONTIN) 300 MG capsule Take 1 capsule (300 mg total) by mouth 3 (three) times daily. 06/01/15  Yes Tresa Garter, MD  HYDROmorphone (DILAUDID) 4 MG tablet Take 1 tablet (4 mg total) by mouth every 4 (four) hours as needed for moderate pain or severe pain. 12/29/15 01/13/16 Yes Olugbemiga Essie Christine, MD  hydroxyurea (HYDREA) 500 MG capsule Take 2 capsules (1,000 mg total) by mouth daily. May take with food to minimize GI side effects. Hold Hydrea until evaluated by Primary Provider or Hematologist. 12/22/15  Yes [name], MD  lisinopril (PRINIVIL,ZESTRIL) 5 MG tablet Take 1 tablet (5 mg total) by mouth daily. 08/04/15  Yes [name], MD  morphine (MS CONTIN) 30 MG 12 hr tablet Take 1 tablet (30 mg total) by mouth every 12 (twelve) hours. 12/29/15 01/28/16 Yes Olugbemiga Essie Christine, MD  potassium chloride SA (K-DUR,KLOR-CON) 20 MEQ tablet Take 2 tablets (40 mEq total) by mouth daily. 12/23/15  Yes [name], MD  rivaroxaban (XARELTO) 20 MG TABS tablet Take 1 tablet (20 mg total) by mouth at bedtime. 12/29/15  Yes Tresa Garter, MD  torsemide (DEMADEX) 20 MG tablet Take 20 mg by mouth daily as needed (for weight gain greater than 3lbs.).    Yes Historical Provider, MD  zolpidem (AMBIEN) 10 MG tablet Take 1 tablet (10 mg total) by mouth at bedtime as needed for sleep. 12/29/15 01/28/16 Yes Tresa Garter, MD    Family History Family History  Problem Relation Age of Onset  . Sickle cell trait Mother   . Depression Mother   . Diabetes Mother   . Sickle cell trait Father   . Sickle cell trait Brother     Social History Social History  Substance Use Topics  . Smoking status: Former Smoker    Packs/day: 0.50    Years: 10.00    Types: Cigarettes    Quit date:  05/29/2011  . Smokeless tobacco: Never Used  . Alcohol use No     Allergies   Patient has no known allergies.   Review of Systems Review of Systems  Constitutional: Negative for fever.  HENT: Negative for sore throat.   Eyes: Negative for visual disturbance.  Respiratory: Negative for shortness of breath.   Cardiovascular: Negative for chest pain.  Gastrointestinal: Positive for abdominal pain. Negative for nausea and vomiting.  Genitourinary: Positive for flank pain. Negative for difficulty urinating.  Musculoskeletal: Positive for back pain. Negative for neck stiffness.  Skin: Negative for  rash.  Neurological: Negative for syncope and headaches.     Physical Exam Updated Vital Signs BP 105/76   Pulse 91   Temp 98.1 F (36.7 C) (Oral)   Resp 20   Ht 6' (1.829 m)   Wt 182 lb (82.6 kg)   SpO2 94%   BMI 24.68 kg/m   Physical Exam  Constitutional: He is oriented to person, place, and time. He appears well-developed and well-nourished. No distress.  HENT:  Head: Normocephalic and atraumatic.  Eyes: Conjunctivae and EOM are normal.  Neck: Normal range of motion.  Cardiovascular: Normal rate, regular rhythm, normal heart sounds and intact distal pulses.  Exam reveals no gallop and no friction rub.   No murmur heard. Pulmonary/Chest: Effort normal and breath sounds normal. No respiratory distress. He has no wheezes. He has no rales.  Abdominal: Soft. He exhibits no distension. There is no tenderness. There is no guarding.  Musculoskeletal: He exhibits no edema.  Neurological: He is alert and oriented to person, place, and time.  Skin: Skin is warm and dry. He is not diaphoretic.  Nursing note and vitals reviewed.    ED Treatments / Results  Labs (all labs ordered are listed, but only abnormal results are displayed) Labs Reviewed  COMPREHENSIVE METABOLIC PANEL - Abnormal; Notable for the following:       Result Value   Glucose, Bld 113 (*)    BUN 27 (*)    AST 55  (*)    Total Bilirubin 5.2 (*)    All other components within normal limits  CBC WITH DIFFERENTIAL/PLATELET - Abnormal; Notable for the following:    WBC 11.2 (*)    RBC 2.62 (*)    Hemoglobin 7.9 (*)    HCT 23.0 (*)    RDW 21.1 (*)    Neutro Abs 8.2 (*)    Monocytes Absolute 1.1 (*)    All other components within normal limits  RETICULOCYTES - Abnormal; Notable for the following:    Retic Ct Pct 5.9 (*)    RBC. 2.62 (*)    All other components within normal limits    EKG  EKG Interpretation None       Radiology No results found.  Procedures Procedures (including critical care time)  Medications Ordered in ED Medications  HYDROmorphone (DILAUDID) injection 2 mg (2 mg Intravenous Given 01/13/16 1241)    Or  HYDROmorphone (DILAUDID) injection 2 mg ( Subcutaneous See Alternative 01/13/16 1241)  HYDROmorphone (DILAUDID) injection 4 mg (4 mg Intravenous Given 01/13/16 1202)    Or  HYDROmorphone (DILAUDID) injection 4 mg ( Subcutaneous See Alternative 01/13/16 1202)  ketorolac (TORADOL) 30 MG/ML injection 30 mg (30 mg Intravenous Given 01/13/16 1205)     Initial Impression / Assessment and Plan / ED Course  I have reviewed the triage vital signs and the nursing notes.  Pertinent labs & imaging results that were available during my care of the patient were reviewed by me and considered in my medical decision making (see chart for details).  Clinical Course    35yo male presents with concern for sickle cell pain crisis, bilateral flank and back pain.  No fevers, doubt bacteremia. No CP/dyspnea. No focal or midline back pain and doubt spinal fx/infxn. No urinary symptoms and doubt pyelonephritis. Hx not consistent with nephrolithiasis. No abdominal tenderness.  Pt reports similar to sickle cell pain and feel likely sickle cell pain crisis in setting of weather changes.  Pt transferred to sickle cell center for further care.  Final Clinical Impressions(s) / ED Diagnoses    Final diagnoses:  Sickle cell pain crisis [facility])    New Prescriptions Discharge Medication List as of 01/13/2016 12:57 PM       Gareth Morgan, MD 01/13/16 2259

## 2016-01-13 NOTE — Discharge Summary (Signed)
[facility] Discharge Summary   Patient ID: [name] MRN: [id number] DOB/AGE: 04-02-1980 35 y.o.  Admit date: 01/13/2016 Discharge date: 01/13/2016  Primary Care Physician:  [name], MD  Admission Diagnoses:  Active Problems:   Sickle cell crisis ([place])  Discharge Medications:    Medication List    TAKE these medications   ambrisentan 5 MG tablet Commonly known as:  LETAIRIS Take 5 mg by mouth at bedtime.   aspirin 81 MG chewable tablet Chew 1 tablet (81 mg total) by mouth daily.   cholecalciferol 1000 units tablet Commonly known as:  VITAMIN D Take 2,000 Units by mouth daily.   folic acid 1 MG tablet Commonly known as:  FOLVITE Take 1 mg by mouth daily.   gabapentin 300 MG capsule Commonly known as:  NEURONTIN Take 1 capsule (300 mg total) by mouth 3 (three) times daily.   HYDROmorphone 4 MG tablet Commonly known as:  DILAUDID Take 1 tablet (4 mg total) by mouth every 4 (four) hours as needed for moderate pain or severe pain.   hydroxyurea 500 MG capsule Commonly known as:  HYDREA Take 2 capsules (1,000 mg total) by mouth daily. May take with food to minimize GI side effects. Hold Hydrea until evaluated by Primary Provider or Hematologist.   JADENU 360 MG Tabs Generic drug:  Deferasirox Take 1,080 mg by mouth daily.   lisinopril 5 MG tablet Commonly known as:  PRINIVIL,ZESTRIL Take 1 tablet (5 mg total) by mouth daily.   morphine 30 MG 12 hr tablet Commonly known as:  MS CONTIN Take 1 tablet (30 mg total) by mouth every 12 (twelve) hours.   potassium chloride SA 20 MEQ tablet Commonly known as:  K-DUR,KLOR-CON Take 2 tablets (40 mEq total) by mouth daily.   rivaroxaban 20 MG Tabs tablet Commonly known as:  XARELTO Take 1 tablet (20 mg total) by mouth at bedtime.   torsemide 20 MG tablet Commonly known as:  DEMADEX Take 20 mg by mouth daily as needed (for weight gain greater than 3lbs.).   zolpidem 10 MG  tablet Commonly known as:  AMBIEN Take 1 tablet (10 mg total) by mouth at bedtime as needed for sleep.        Consults:  None  Significant Diagnostic Studies:  Dg Chest 2 View  Result Date: 01/03/2016 CLINICAL DATA:  Chest and back pain for 5 days.  Sickle cell anemia. EXAM: CHEST  2 VIEW COMPARISON:  01/01/2016 FINDINGS: Mild to moderate cardiomegaly remains stable. Left-sided power port remains in appropriate position. Chronic pulmonary vascular congestion is again demonstrated. No evidence of acute infiltrate or pulmonary edema. No evidence of pleural effusion. IMPRESSION: Stable cardiomegaly and pulmonary venous hypertension. No acute findings. Electronically Signed   By: Earle Gell M.D.   On: 01/03/2016 15:18   Dg Chest 2 View  Result Date: 01/01/2016 CLINICAL DATA:  Sickle cell pain crisis. Bilateral anterior rib pain. Initial encounter. EXAM: CHEST  2 VIEW COMPARISON:  Chest radiograph performed 12/27/2015 FINDINGS: The lungs are well-aerated and clear. There is no evidence of focal opacification, pleural effusion or pneumothorax. The heart is mildly enlarged. A left-sided chest port is noted ending about the distal SVC. No acute osseous abnormalities are seen. IMPRESSION: Mild cardiomegaly. Lungs remain grossly clear. No displaced rib fracture seen. Electronically Signed   By: Garald Balding M.D.   On: 01/01/2016 02:32   Dg Chest 2 View  Result Date: 12/27/2015 CLINICAL DATA:  Bilateral rib pain since 3  p.m. today. History of sickle cell. History of acute chest syndrome. EXAM: CHEST  2 VIEW COMPARISON:  12/21/2015 FINDINGS: Power port type central venous catheter with tip over the cavoatrial junction region. No pneumothorax. No change in position since prior study. Cardiac enlargement without vascular congestion. No focal consolidation or edema. No blunting of costophrenic angles. No pneumothorax. Mediastinal contours appear intact. IMPRESSION: Cardiac enlargement. No evidence of  active pulmonary disease. No focal consolidation. Electronically Signed   By: Lucienne Capers M.D.   On: 12/27/2015 06:47   Dg Chest 2 View  Result Date: 12/21/2015 CLINICAL DATA:  Hypoxia with shortness of breath history of sickle cell EXAM: CHEST  2 VIEW COMPARISON:  11/27/2015 FINDINGS: A left-sided central venous port tip overlies the distal SVC. Moderate-to-marked cardiomegaly is similar compared to the prior study. No effusion. No consolidation. No pneumothorax. Small amount of atelectasis at the left base. IMPRESSION: Cardiomegaly without acute infiltrate or overt pulmonary edema. Electronically Signed   By: Donavan Foil M.D.   On: 12/21/2015 15:36   Dg Abd 1 View  Result Date: 12/16/2015 CLINICAL DATA:  Acute abdominal pain.  Nausea and vomiting. EXAM: ABDOMEN - 1 VIEW COMPARISON:  CT yesterday FINDINGS: Enteric contrast from CT yesterday in the distal transverse and descending colon. Small to moderate stool burden. No bowel dilatation to suggest obstruction. No evidence of free air on portable supine view. Cholecystectomy clips in the right upper quadrant of the abdomen. Right hip arthroplasty in place. IMPRESSION: No evidence of bowel obstruction or free air. Enteric contrast from CT yesterday in the distal transverse and descending colon. Small to moderate stool burden. Electronically Signed   By: Jeb Levering M.D.   On: 12/16/2015 17:35   Ct Abdomen Pelvis W Contrast  Result Date: 12/15/2015 CLINICAL DATA:  Upper abdominal pain, nausea, vomiting since 7 a.m. EXAM: CT ABDOMEN AND PELVIS WITH CONTRAST TECHNIQUE: Multidetector CT imaging of the abdomen and pelvis was performed using the standard protocol following bolus administration of intravenous contrast. CONTRAST:  19mL ISOVUE-300 IOPAMIDOL (ISOVUE-300) INJECTION 61% COMPARISON:  06/03/2015 FINDINGS: Lower chest: Cardiomegaly.  Mild lingular and bibasilar atelectasis. Hepatobiliary: No focal liver abnormality is seen. Status post  cholecystectomy. No biliary dilatation. Pancreas: Unremarkable. No pancreatic ductal dilatation or surrounding inflammatory changes. Spleen: Extensive chronic splenic infarction with calcification. Adrenals/Urinary Tract: Adrenal glands are unremarkable. Kidneys are normal, without renal calculi, focal lesion, or hydronephrosis. Bladder is unremarkable. Stomach/Bowel: Bowel wall thickening of the ascending and transverse colon concerning for colitis. No pneumatosis, pneumoperitoneum or portal venous gas. No bowel obstruction. Vascular/Lymphatic: Normal caliber abdominal aorta with atherosclerosis. No lymphadenopathy. Reproductive: Prostate is unremarkable. Other: No fluid collection or hematoma.  No abdominal wall hernia. Musculoskeletal: No acute osseous abnormality. No aggressive is lytic or sclerotic osseous lesion. Mild osteosclerosis consistent with sickle cell anemia. Right total hip arthroplasty without failure complication. Mild subchondral sclerosis in the left femoral head concerning for avascular necrosis. IMPRESSION: 1. Bowel wall thickening of the ascending and transverse colon concerning for colitis which may be secondary to an infectious, inflammatory or ischemic etiology given the patient's history of sickle cell anemia. 2. Mild subchondral sclerosis in the left femoral head concerning for avascular necrosis. 3. Stable cardiomegaly. Electronically Signed   By: Kathreen Devoid   On: 12/15/2015 12:57     [name] Course: Patient transitioned to the sickle cell day infusion center from the emergency department. Patient's labs were reviewed, consistent with previous values. He was started on high concentration PCA  dilaudid due to opiate tolerance. He used a total of 10.5 mg with  21 demands and 15 deliveries. Pain intensity decreased from 8/10 to 5/10. Patient will discharge home on current medication regimen. Recommend that patient follow up in clinic as scheduled. He is alert,  oriented, and ambulating. He will discharge home with family in stable condition.   The patient was given clear instructions to go to ER or return to medical center if symptoms do not improve, worsen or new problems develop. The patient verbalized understanding.  Physical Exam at Discharge:  BP 106/62 (BP Location: Left Arm)   Pulse (!) 104   Temp 98.2 F (36.8 C) (Oral)   Resp 16   Ht 6' (1.829 m)   Wt 182 lb (82.6 kg)   SpO2 94%   BMI 24.68 kg/m BP 106/62 (BP Location: Left Arm)   Pulse (!) 104   Temp 98.2 F (36.8 C) (Oral)   Resp 16   Ht 6' (1.829 m)   Wt 182 lb (82.6 kg)   SpO2 94%   BMI 24.68 kg/m   General Appearance:    Alert, cooperative, no distress, appears stated age  Head:    Normocephalic, without obvious abnormality, atraumatic  Eyes:    PERRL, conjunctiva/corneas clear, EOM's intact, fundi    benign, both eyes       Lungs:     Clear to auscultation bilaterally, respirations unlabored  Chest wall:    No tenderness or deformity  Heart:    Regular rate and rhythm, S1 and S2 normal, no murmur, rub   or gallop  Abdomen:     Soft, non-tender, bowel sounds active all four quadrants,    no masses, no organomegaly  Extremities:   Extremities normal, atraumatic, no cyanosis or edema    Disposition at Discharge: Home  Discharge Orders: Discharge Instructions    Discharge patient    Complete by:  As directed       Condition at Discharge:   Stable  Time spent on Discharge:  15 minutes  Signed: Oumar Marcott M 01/13/2016, 4:34 PM

## 2016-01-16 ENCOUNTER — Inpatient Hospital Stay (HOSPITAL_COMMUNITY)
Admission: EM | Admit: 2016-01-16 | Discharge: 2016-01-18 | DRG: 812 | Discharge status: Against Medical Advice | Payer: Medicare Other | Attending: Internal Medicine | Admitting: Internal Medicine

## 2016-01-16 ENCOUNTER — Encounter (HOSPITAL_COMMUNITY): Payer: Self-pay | Admitting: *Deleted

## 2016-01-16 DIAGNOSIS — D638 Anemia in other chronic diseases classified elsewhere: Secondary | ICD-10-CM | POA: Diagnosis not present

## 2016-01-16 DIAGNOSIS — R109 Unspecified abdominal pain: Secondary | ICD-10-CM | POA: Diagnosis not present

## 2016-01-16 DIAGNOSIS — I69320 Aphasia following cerebral infarction: Secondary | ICD-10-CM | POA: Diagnosis not present

## 2016-01-16 DIAGNOSIS — Z87891 Personal history of nicotine dependence: Secondary | ICD-10-CM | POA: Diagnosis not present

## 2016-01-16 DIAGNOSIS — G8929 Other chronic pain: Secondary | ICD-10-CM | POA: Diagnosis not present

## 2016-01-16 DIAGNOSIS — Z833 Family history of diabetes mellitus: Secondary | ICD-10-CM | POA: Diagnosis not present

## 2016-01-16 DIAGNOSIS — I10 Essential (primary) hypertension: Secondary | ICD-10-CM | POA: Diagnosis not present

## 2016-01-16 DIAGNOSIS — Z818 Family history of other mental and behavioral disorders: Secondary | ICD-10-CM | POA: Diagnosis not present

## 2016-01-16 DIAGNOSIS — I2721 Secondary pulmonary arterial hypertension: Secondary | ICD-10-CM | POA: Diagnosis present

## 2016-01-16 DIAGNOSIS — Z96641 Presence of right artificial hip joint: Secondary | ICD-10-CM | POA: Diagnosis present

## 2016-01-16 DIAGNOSIS — D72829 Elevated white blood cell count, unspecified: Secondary | ICD-10-CM | POA: Diagnosis present

## 2016-01-16 DIAGNOSIS — Z79891 Long term (current) use of opiate analgesic: Secondary | ICD-10-CM

## 2016-01-16 DIAGNOSIS — Z8673 Personal history of transient ischemic attack (TIA), and cerebral infarction without residual deficits: Secondary | ICD-10-CM | POA: Diagnosis present

## 2016-01-16 DIAGNOSIS — D5700 Hb-SS disease with crisis, unspecified: Secondary | ICD-10-CM | POA: Diagnosis not present

## 2016-01-16 DIAGNOSIS — R10A0 Flank pain, unspecified side: Secondary | ICD-10-CM | POA: Diagnosis present

## 2016-01-16 DIAGNOSIS — J9611 Chronic respiratory failure with hypoxia: Secondary | ICD-10-CM | POA: Diagnosis present

## 2016-01-16 DIAGNOSIS — Z86718 Personal history of other venous thrombosis and embolism: Secondary | ICD-10-CM

## 2016-01-16 DIAGNOSIS — Z7901 Long term (current) use of anticoagulants: Secondary | ICD-10-CM | POA: Diagnosis not present

## 2016-01-16 DIAGNOSIS — Z86711 Personal history of pulmonary embolism: Secondary | ICD-10-CM | POA: Diagnosis present

## 2016-01-16 LAB — COMPREHENSIVE METABOLIC PANEL
ALK PHOS: 90 U/L (ref 38–126)
ALT: 33 U/L (ref 17–63)
ANION GAP: 9 (ref 5–15)
AST: 39 U/L (ref 15–41)
Albumin: 4.6 g/dL (ref 3.5–5.0)
BILIRUBIN TOTAL: 4.3 mg/dL — AB (ref 0.3–1.2)
BUN: 10 mg/dL (ref 6–20)
CALCIUM: 9.4 mg/dL (ref 8.9–10.3)
CO2: 23 mmol/L (ref 22–32)
Chloride: 105 mmol/L (ref 101–111)
Creatinine, Ser: 0.76 mg/dL (ref 0.61–1.24)
GFR calc Af Amer: >60 mL/min (ref >60)
Glucose, Bld: 105 mg/dL — ABNORMAL HIGH (ref 65–99)
POTASSIUM: 3.6 mmol/L (ref 3.5–5.1)
Sodium: 137 mmol/L (ref 135–145)
TOTAL PROTEIN: 7.7 g/dL (ref 6.5–8.1)

## 2016-01-16 LAB — CBC WITH DIFFERENTIAL/PLATELET
BASOS ABS: 0 10*3/uL (ref 0.0–0.1)
Basophils Relative: 0 %
EOS ABS: 0.4 10*3/uL (ref 0.0–0.7)
Eosinophils Relative: 2 %
HCT: 22.9 % — ABNORMAL LOW (ref 39.0–52.0)
Hemoglobin: 7.9 g/dL — ABNORMAL LOW (ref 13.0–17.0)
LYMPHS ABS: 1.6 10*3/uL (ref 0.7–4.0)
LYMPHS PCT: 9 %
MCH: 30.6 pg (ref 26.0–34.0)
MCHC: 34.5 g/dL (ref 30.0–36.0)
MCV: 88.8 fL (ref 78.0–100.0)
MONO ABS: 1.9 10*3/uL — AB (ref 0.1–1.0)
Monocytes Relative: 11 %
NEUTROS ABS: 13.6 10*3/uL — AB (ref 1.7–7.7)
Neutrophils Relative %: 78 %
PLATELETS: 503 10*3/uL — AB (ref 150–400)
RBC: 2.58 MIL/uL — ABNORMAL LOW (ref 4.22–5.81)
RDW: 23.3 % — AB (ref 11.5–15.5)
WBC: 17.5 10*3/uL — ABNORMAL HIGH (ref 4.0–10.5)

## 2016-01-16 LAB — RETICULOCYTES
RBC.: 2.58 MIL/uL — AB (ref 4.22–5.81)
RETIC CT PCT: 13 % — AB (ref 0.4–3.1)
Retic Count, Absolute: 335.4 10*3/uL — ABNORMAL HIGH (ref 19.0–186.0)

## 2016-01-16 LAB — URINALYSIS, ROUTINE W REFLEX MICROSCOPIC
BACTERIA UA: NONE SEEN
BILIRUBIN URINE: NEGATIVE
Glucose, UA: NEGATIVE mg/dL
KETONES UR: NEGATIVE mg/dL
Leukocytes, UA: NEGATIVE
Nitrite: NEGATIVE
PROTEIN: NEGATIVE mg/dL
Specific Gravity, Urine: 1.011 (ref 1.005–1.030)
pH: 7 (ref 5.0–8.0)

## 2016-01-16 LAB — MRSA PCR SCREENING: MRSA by PCR: NEGATIVE

## 2016-01-16 MED ORDER — HYDROMORPHONE HCL 2 MG/ML IJ SOLN: 2.0000 mg | INTRAMUSCULAR | Status: AC

## 2016-01-16 MED ORDER — SODIUM CHLORIDE 0.9% FLUSH: 9.0000 mL | INTRAVENOUS | Status: DC | PRN

## 2016-01-16 MED ORDER — DIPHENHYDRAMINE HCL 25 MG PO CAPS
25.0000 mg | ORAL_CAPSULE | ORAL | Status: DC | PRN
  Administered 2016-01-16: 50 mg via ORAL
  Filled 2016-01-16: qty 2

## 2016-01-16 MED ORDER — DIPHENHYDRAMINE HCL 50 MG/ML IJ SOLN: 12.5000 mg | Freq: Four times a day (QID) | INTRAMUSCULAR | Status: DC | PRN

## 2016-01-16 MED ORDER — HYDROMORPHONE HCL 2 MG/ML IJ SOLN
2.0000 mg | INTRAMUSCULAR | Status: AC
  Administered 2016-01-16: 2 mg via INTRAVENOUS
  Filled 2016-01-16: qty 1

## 2016-01-16 MED ORDER — ONDANSETRON HCL 4 MG/2ML IJ SOLN
4.0000 mg | INTRAMUSCULAR | Status: DC | PRN
  Administered 2016-01-16: 4 mg via INTRAVENOUS
  Filled 2016-01-16: qty 2

## 2016-01-16 MED ORDER — DEXTROSE-NACL 5-0.45 % IV SOLN
INTRAVENOUS | Status: DC
  Administered 2016-01-16: 19:00:00 via INTRAVENOUS

## 2016-01-16 MED ORDER — ONDANSETRON HCL 4 MG/2ML IJ SOLN: 4.0000 mg | Freq: Four times a day (QID) | INTRAMUSCULAR | Status: DC | PRN

## 2016-01-16 MED ORDER — NALOXONE HCL 0.4 MG/ML IJ SOLN: 0.4000 mg | INTRAMUSCULAR | Status: DC | PRN

## 2016-01-16 MED ORDER — SODIUM CHLORIDE 0.45 % IV SOLN
INTRAVENOUS | Status: DC
  Administered 2016-01-16: 04:00:00 via INTRAVENOUS

## 2016-01-16 MED ORDER — KETOROLAC TROMETHAMINE 30 MG/ML IJ SOLN
30.0000 mg | Freq: Four times a day (QID) | INTRAMUSCULAR | Status: DC
  Administered 2016-01-16 – 2016-01-18 (×7): 30 mg via INTRAVENOUS
  Filled 2016-01-16 (×7): qty 1

## 2016-01-16 MED ORDER — DIPHENHYDRAMINE HCL 25 MG PO CAPS
25.0000 mg | ORAL_CAPSULE | ORAL | Status: DC | PRN
  Administered 2016-01-17: 25 mg via ORAL
  Filled 2016-01-16: qty 1

## 2016-01-16 MED ORDER — DIPHENHYDRAMINE HCL 12.5 MG/5ML PO ELIX
12.5000 mg | ORAL_SOLUTION | Freq: Four times a day (QID) | ORAL | Status: DC | PRN
  Administered 2016-01-16: 12.5 mg via ORAL
  Filled 2016-01-16: qty 5

## 2016-01-16 MED ORDER — HYDROXYUREA 500 MG PO CAPS
1000.0000 mg | ORAL_CAPSULE | Freq: Every day | ORAL | Status: DC
  Administered 2016-01-16 – 2016-01-17 (×2): 1000 mg via ORAL
  Filled 2016-01-16 (×4): qty 2

## 2016-01-16 MED ORDER — HYDROMORPHONE 1 MG/ML IV SOLN
INTRAVENOUS | Status: DC
  Administered 2016-01-16: 2 mg via INTRAVENOUS
  Administered 2016-01-16: 9.47 mg via INTRAVENOUS
  Administered 2016-01-16: 2 mg via INTRAVENOUS
  Administered 2016-01-16: 19:00:00 via INTRAVENOUS
  Administered 2016-01-17: 4.1 mg via INTRAVENOUS
  Administered 2016-01-17: 2 mg via INTRAVENOUS
  Administered 2016-01-17 (×2): via INTRAVENOUS
  Administered 2016-01-17: 7.7 mg via INTRAVENOUS
  Administered 2016-01-17: 6.3 mg via INTRAVENOUS
  Administered 2016-01-17: 2 mg via INTRAVENOUS
  Administered 2016-01-17: 9 mg via INTRAVENOUS
  Administered 2016-01-18: 2 mg via INTRAVENOUS
  Administered 2016-01-18: 8.3 mg via INTRAVENOUS
  Administered 2016-01-18: 15.9 mg via INTRAVENOUS
  Administered 2016-01-18: 2 mg via INTRAVENOUS
  Filled 2016-01-16 (×2): qty 25

## 2016-01-16 MED ORDER — LISINOPRIL 5 MG PO TABS
5.0000 mg | ORAL_TABLET | Freq: Every day | ORAL | Status: DC
  Administered 2016-01-16 – 2016-01-17 (×2): 5 mg via ORAL
  Filled 2016-01-16 (×2): qty 1

## 2016-01-16 MED ORDER — FOLIC ACID 1 MG PO TABS
1.0000 mg | ORAL_TABLET | Freq: Every day | ORAL | Status: DC
  Administered 2016-01-16 – 2016-01-17 (×2): 1 mg via ORAL
  Filled 2016-01-16 (×2): qty 1

## 2016-01-16 MED ORDER — HYDROMORPHONE HCL 2 MG/ML IJ SOLN: 2.0000 mg | INTRAMUSCULAR | Status: DC

## 2016-01-16 MED ORDER — SODIUM CHLORIDE 0.9 % IV SOLN: 25.0000 mg | INTRAVENOUS | Status: DC | PRN

## 2016-01-16 MED ORDER — HYDROMORPHONE HCL 2 MG PO TABS
4.0000 mg | ORAL_TABLET | ORAL | Status: DC
  Administered 2016-01-16 – 2016-01-17 (×8): 4 mg via ORAL
  Filled 2016-01-16 (×8): qty 2

## 2016-01-16 MED ORDER — MORPHINE SULFATE ER 30 MG PO TBCR
30.0000 mg | EXTENDED_RELEASE_TABLET | Freq: Two times a day (BID) | ORAL | Status: DC
  Administered 2016-01-16 – 2016-01-17 (×4): 30 mg via ORAL
  Filled 2016-01-16 (×4): qty 1

## 2016-01-16 MED ORDER — VITAMIN D3 25 MCG (1000 UNIT) PO TABS
2000.0000 [IU] | ORAL_TABLET | Freq: Every day | ORAL | Status: DC
  Administered 2016-01-16 – 2016-01-17 (×2): 2000 [IU] via ORAL
  Filled 2016-01-16 (×2): qty 2

## 2016-01-16 MED ORDER — ZOLPIDEM TARTRATE 10 MG PO TABS
10.0000 mg | ORAL_TABLET | Freq: Every evening | ORAL | Status: DC | PRN
  Administered 2016-01-16 – 2016-01-17 (×2): 10 mg via ORAL
  Filled 2016-01-16 (×2): qty 1

## 2016-01-16 MED ORDER — RIVAROXABAN 20 MG PO TABS
20.0000 mg | ORAL_TABLET | Freq: Every day | ORAL | Status: DC
  Administered 2016-01-16 – 2016-01-17 (×2): 20 mg via ORAL
  Filled 2016-01-16 (×2): qty 1

## 2016-01-16 MED ORDER — HYDROMORPHONE 1 MG/ML IV SOLN
INTRAVENOUS | Status: DC
  Administered 2016-01-16: 16:00:00 via INTRAVENOUS
  Administered 2016-01-16: 5.6 mg via INTRAVENOUS
  Administered 2016-01-16: 12.7 mg via INTRAVENOUS
  Filled 2016-01-16: qty 25

## 2016-01-16 MED ORDER — HYDROMORPHONE HCL 2 MG/ML IJ SOLN
2.0000 mg | INTRAMUSCULAR | Status: DC
  Administered 2016-01-16: 2 mg via INTRAVENOUS
  Filled 2016-01-16: qty 1

## 2016-01-16 MED ORDER — GABAPENTIN 300 MG PO CAPS
300.0000 mg | ORAL_CAPSULE | Freq: Three times a day (TID) | ORAL | Status: DC
  Administered 2016-01-16 – 2016-01-17 (×6): 300 mg via ORAL
  Filled 2016-01-16 (×6): qty 1

## 2016-01-16 MED ORDER — POLYETHYLENE GLYCOL 3350 17 G PO PACK: 17.0000 g | PACK | Freq: Every day | ORAL | Status: DC | PRN

## 2016-01-16 MED ORDER — ASPIRIN 81 MG PO CHEW
81.0000 mg | CHEWABLE_TABLET | Freq: Every day | ORAL | Status: DC
  Administered 2016-01-16 – 2016-01-17 (×2): 81 mg via ORAL
  Filled 2016-01-16 (×2): qty 1

## 2016-01-16 MED ORDER — AMBRISENTAN 5 MG PO TABS: 5.0000 mg | ORAL_TABLET | Freq: Every day | ORAL | Status: DC

## 2016-01-16 MED ORDER — HYDROMORPHONE 1 MG/ML IV SOLN
INTRAVENOUS | Status: DC
  Administered 2016-01-16: 08:00:00 via INTRAVENOUS
  Filled 2016-01-16: qty 25

## 2016-01-16 MED ORDER — SENNOSIDES-DOCUSATE SODIUM 8.6-50 MG PO TABS
1.0000 | ORAL_TABLET | Freq: Two times a day (BID) | ORAL | Status: DC
  Administered 2016-01-16 – 2016-01-17 (×4): 1 via ORAL
  Filled 2016-01-16 (×4): qty 1

## 2016-01-16 MED ORDER — DEFERASIROX 360 MG PO TABS: 1080.0000 mg | ORAL_TABLET | Freq: Every day | ORAL | Status: DC

## 2016-01-16 NOTE — H&P (Signed)
History and Physical    [name] [id number] DOB: 12/13/1980 DOA: 01/16/2016  Referring MD/NP/PA:   PCP: [name], MD   Patient coming from:  The patient is coming from home.  At baseline, pt is independent for most of ADL.   Chief Complaint: bilateral rib cage, flank and back pain.   HPI: [name] is a 35 y.o. male with medical history significant of  sickle cell disease, stroke with aphasia, acute chest syndrome, DVT, PE on Xarelto, pulmonary hypertension, cor pulmonale, chronic respiratory failure, C diff colitis, who presents with bilateral rib cages, flank and back pain.   Patient states that he has worsening pain since yesterday, which has been progressively getting worse. His pain is mainly located in the bilateral sidesof hisrib cages, flank and back. It is constant, 9 out of 10 in severity, nonradiating. It is not aggravated or alleviated by any known factors. States that his home pain medication does not help anymore. He does not have chest pain or shortness of breath. No cough, no fever or chills. Patient denies nausea, vomiting, diarrhea, abdominal pain, symptoms of UTI or new unilateral weakness. 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 No rashes or leg edema.  ED Course: pt was found to have WBC 17.4, hemoglobin 7.9, negative urinalysis, electrolytes renal function okay, temp normal, oxygen saturation 86% on room air, which improved to 94% on 2 L nasal cannula oxygen. Patient is placed on telemetry bed for observation.  Review of Systems:   General: no fevers, chills, no changes in body weight, has fatigue HEENT: no blurry vision, hearing changes or sore throat Respiratory: no dyspnea, coughing, wheezing CV: no chest pain, no palpitations GI: no nausea, vomiting, abdominal pain, diarrhea, constipation GU: no dysuria, burning on urination, increased urinary frequency, hematuria  Ext: no leg  edema Neuro: no unilateral weakness, numbness, or tingling, no vision change or hearing loss Skin: no rash, no skin tear. MSK: bilateral rib cage, flank and back pain.  Heme: No easy bruising.  Travel history: No recent long distant travel.  Allergy: No Known Allergies  Past Medical History:  Diagnosis Date  . Acute chest syndrome ([place]) 06/18/2013  . Acute embolism and thrombosis of right internal jugular vein ([place])   . Alcohol consumption of one to four drinks per day   . Avascular necrosis ([facility])    Right Hip  . Blood transfusion   . Chronic anticoagulation   . Demand ischemia ([place]) 01/02/2014  . Former smoker   . Functional asplenia   . Hb-SS disease with crisis ([place])   . History of Clostridium difficile infection   . History of pulmonary embolus (PE)   . Hypertension   . Hypokalemia   . Leukocytosis    Chronic  . Mood disorder ([place])   . Noncompliance with medication regimen   . Oxygen deficiency   . Pulmonary hypertension   . Second hand tobacco smoke exposure   . Sickle cell anemia ([facility])   . Sickle-cell crisis with associated acute chest syndrome ([place]) 05/13/2013  . Stroke ([place])   . Thrombocytosis ([facility])    Chronic  . Uses marijuana     Past Surgical History:  Procedure Laterality Date  . CHOLECYSTECTOMY     01/2008  . Excision of left periauricular cyst     10/2009  . Excision of right ear lobe cyst with primary closur     11/2007  . Porta cath placement    .  Porta cath removal    . PORTACATH PLACEMENT  01/05/2012   Procedure: INSERTION PORT-A-CATH;  Surgeon: Odis Hollingshead, MD;  Location: [place];  Service: General;  Laterality: N/A;  ultrasound guiced port a cath insertion with fluoroscopy  . Right hip replacement     08/2006  . UMBILICAL HERNIA REPAIR     01/2008    Social History:  reports that he quit smoking about 4 years ago. His smoking use included Cigarettes. He has a 5.00 pack-year smoking history. He has never used smokeless tobacco. He reports  that he does not drink alcohol or use drugs.  Family History:  Family History  Problem Relation Age of Onset  . Sickle cell trait Mother   . Depression Mother   . Diabetes Mother   . Sickle cell trait Father   . Sickle cell trait Brother      Prior to Admission medications   Medication Sig Start Date End Date Taking? Authorizing Provider  ambrisentan (LETAIRIS) 5 MG tablet Take 5 mg by mouth at bedtime.   Yes Historical Provider, MD  aspirin 81 MG chewable tablet Chew 1 tablet (81 mg total) by mouth daily. 07/23/14  Yes [name], MD  cholecalciferol (VITAMIN D) 1000 units tablet Take 2,000 Units by mouth daily.   Yes Historical Provider, MD  Deferasirox (JADENU) 360 MG TABS Take 1,080 mg by mouth daily.    Yes Historical Provider, MD  folic acid (FOLVITE) 1 MG tablet Take 1 mg by mouth daily.   Yes Historical Provider, MD  gabapentin (NEURONTIN) 300 MG capsule Take 1 capsule (300 mg total) by mouth 3 (three) times daily. 06/01/15  Yes Tresa Garter, MD  hydroxyurea (HYDREA) 500 MG capsule Take 2 capsules (1,000 mg total) by mouth daily. May take with food to minimize GI side effects. Hold Hydrea until evaluated by Primary Provider or Hematologist. 12/22/15  Yes [name], MD  lisinopril (PRINIVIL,ZESTRIL) 5 MG tablet Take 1 tablet (5 mg total) by mouth daily. 08/04/15  Yes [name], MD  morphine (MS CONTIN) 30 MG 12 hr tablet Take 1 tablet (30 mg total) by mouth every 12 (twelve) hours. 12/29/15 01/28/16 Yes Olugbemiga Essie Christine, MD  potassium chloride SA (K-DUR,KLOR-CON) 20 MEQ tablet Take 2 tablets (40 mEq total) by mouth daily. 12/23/15  Yes [name], MD  rivaroxaban (XARELTO) 20 MG TABS tablet Take 1 tablet (20 mg total) by mouth at bedtime. 12/29/15  Yes Tresa Garter, MD  torsemide (DEMADEX) 20 MG tablet Take 20 mg by mouth daily as needed (for weight gain greater than 3lbs.).    Yes Historical Provider, MD  zolpidem (AMBIEN) 10 MG  tablet Take 1 tablet (10 mg total) by mouth at bedtime as needed for sleep. 12/29/15 01/28/16 Yes Tresa Garter, MD    Physical Exam: Vitals:   01/16/16 0430 01/16/16 0500 01/16/16 0530 01/16/16 0600  BP: 117/75 106/74 107/81 110/73  Pulse: 91 75 89 90  Resp: 25 15 19 18   Temp:      TempSrc:      SpO2: 95% 96% 94% 94%  Weight:      Height:       General: Not in acute distress HEENT:       Eyes: PERRL, EOMI, no scleral icterus.       ENT: No discharge from the ears and nose, no pharynx injection, no tonsillar enlargement.        Neck: No JVD, no bruit,  no mass felt. Heme: No neck lymph node enlargement. Cardiac: S1/S2, RRR, No murmurs, No gallops or rubs. Respiratory: No rales, wheezing, rhonchi or rubs. GI: Soft, nondistended, nontender, no rebound pain, no organomegaly, BS present. GU: No hematuria Ext: No pitting leg edema bilaterally. 2+DP/PT pulse bilaterally. Musculoskeletal: has tenderness in bilateral rib cage, flank and back pain.  Skin: No rashes.  Neuro: Alert, oriented X3, cranial nerves II-XII grossly intact, moves all extremities normally. Psych: Patient is not psychotic, no suicidal or hemocidal ideation.  Labs on Admission: I have personally reviewed following labs and imaging studies  CBC:  Recent Labs Lab 01/13/16 1155 01/16/16 0400  WBC 11.2* 17.5*  NEUTROABS 8.2* 13.6*  HGB 7.9* 7.9*  HCT 23.0* 22.9*  MCV 87.8 88.8  PLT 375 [id number]*   Basic Metabolic Panel:  Recent Labs Lab 01/13/16 1155 01/16/16 0400  NA 137 137  K 4.4 3.6  CL 110 105  CO2 22 23  GLUCOSE 113* 105*  BUN 27* 10  CREATININE 1.03 0.76  CALCIUM 8.9 9.4   GFR: Estimated Creatinine Clearance: 140.1 mL/min (by C-G formula based on SCr of 0.76 mg/dL). Liver Function Tests:  Recent Labs Lab 01/13/16 1155 01/16/16 0400  AST 55* 39  ALT 36 33  ALKPHOS 88 90  BILITOT 5.2* 4.3*  PROT 7.6 7.7  ALBUMIN 4.4 4.6   No results for input(s): LIPASE, AMYLASE in the last 168  hours. No results for input(s): AMMONIA in the last 168 hours. Coagulation Profile: No results for input(s): INR, PROTIME in the last 168 hours. Cardiac Enzymes: No results for input(s): CKTOTAL, CKMB, CKMBINDEX, TROPONINI in the last 168 hours. BNP (last 3 results) No results for input(s): PROBNP in the last 8760 hours. HbA1C: No results for input(s): HGBA1C in the last 72 hours. CBG: No results for input(s): GLUCAP in the last 168 hours. Lipid Profile: No results for input(s): CHOL, HDL, LDLCALC, TRIG, CHOLHDL, LDLDIRECT in the last 72 hours. Thyroid Function Tests: No results for input(s): TSH, T4TOTAL, FREET4, T3FREE, THYROIDAB in the last 72 hours. Anemia Panel:  Recent Labs  01/13/16 1155 01/16/16 0400  RETICCTPCT 5.9* 13.0*   Urine analysis:    Component Value Date/Time   COLORURINE YELLOW 01/16/2016 0435   APPEARANCEUR CLEAR 01/16/2016 0435   LABSPEC 1.011 01/16/2016 0435   PHURINE 7.0 01/16/2016 0435   GLUCOSEU NEGATIVE 01/16/2016 0435   HGBUR SMALL (A) 01/16/2016 0435   BILIRUBINUR NEGATIVE 01/16/2016 0435   KETONESUR NEGATIVE 01/16/2016 0435   PROTEINUR NEGATIVE 01/16/2016 0435   UROBILINOGEN 0.2 03/24/2015 1426   NITRITE NEGATIVE 01/16/2016 0435   LEUKOCYTESUR NEGATIVE 01/16/2016 0435   Sepsis Labs: @LABRCNTIP (procalcitonin:4,lacticidven:4) )No results found for this or any previous visit (from the past 240 hour(s)).   Radiological Exams on Admission: No results found.   EKG:  Not done in ED, will get one.   Assessment/Plan Principal Problem:   Hb-SS disease with crisis ([place]) Active Problems:   Hx of pulmonary embolus   Chronic anticoagulation   Essential hypertension   PAH (pulmonary artery hypertension)   Sickle cell pain crisis ([place])   Ischemic stroke ([place])   Flank pain   Principal Problem:   Hb-SS disease with crisis ([place]) Active Problems:   Hx of pulmonary embolus   Chronic anticoagulation   Essential hypertension   PAH (pulmonary  artery hypertension)   Sickle cell pain crisis ([place])   Ischemic stroke ([place])   Flank pain   Sickle cell pain crisis ([place]): No CP. No acute  chest syndrome clinically. Hemoglobin stable, 7.9. No indication to blood transfusion now.  -will place on tele bed for obs -continue folic acid and hydroxyurea -continue Jadenu for secondary Hemochromatosis -IVF: D5-1/2NS at 125cc/h -Zofran for nausea -Start high dose PCA protocol for pain: loading dose 1.5 mg, bolus dose 0.8 mg, lockout interval 10 min and one hour dose limit at 4.8 mg. - continue home MS Contin - will not start Toradol since pt is on Xarelto  Chronic respiratory failure secondary to pulmonary hypertension and Cor pulmonale:pt has no SOB.  -prn Nasal cannula oxygen to maintain oxygen saturation -hold torsemide since patient needs IV fluid for sickle cell pain crisis -continue letairis  Hx of pulmonary embolus and DVT: -continue Xarelto  Hx of stroke: has aphasia. -continue ASA  HTN: -Lisinopril   DVT ppx: On Xarelto Code Status: Full code Family Communication: None at bed side.    Disposition Plan:  Anticipate discharge back to previous home environment Consults called: none   Admission status: Obs / tele    Date of Service 01/16/2016    Ivor Costa [facility] Pager [phone]  If 7PM-7AM, please contact night-coverage [email or web address] Password [facility] 01/16/2016, 6:25 AM

## 2016-01-16 NOTE — Progress Notes (Signed)
Patient ID: [name], male   DOB: 05/01/1980, 35 y.o.   MRN: [id number]  Pt states that he continues to have pain in his ribs. Pt has an appointment on Monday morning for chronic exchange transfusion. Pt has been using his Oxygen except for about 3 hours/day when he does not use it. Pt states that he intends to leave the hospital tomorrow as he must keep his appointment on Monday morning. We have discussed his therapy and in addition to the PCA I will schedule his oral Dilaudid and prescribe some doses of Dilaudid on a PRN basis.  Diagnostic studies and orders have been reviewed. Continue with plan as noted above.  Pacey Altizer A.

## 2016-01-16 NOTE — ED Provider Notes (Signed)
[facility] DEPT Provider Note   CSN: [id number] Arrival date & time: 01/16/16  0220     History   Chief Complaint Chief Complaint  Patient presents with  . Flank Pain    HPI [name] is a 35 y.o. male.  Patient states he has his typical sickle cell crisis pain, right flank region, which has not improved with his usual at-home medication's including both short and long-acting narcotic medications. He denies fever, chills, cough, shortness of breath, anterior chest pain, weakness or dizziness. He's had multiple recent hospitalizations and treatments for the same problem. He has not had a blood transfusion recently. He has chronic pain. There are no other known modifying factors.  HPI  Past Medical History:  Diagnosis Date  . Acute chest syndrome ([place]) 06/18/2013  . Acute embolism and thrombosis of right internal jugular vein ([place])   . Alcohol consumption of one to four drinks per day   . Avascular necrosis (HCC)    Right Hip  . Blood transfusion   . Chronic anticoagulation   . Demand ischemia ([place]) 01/02/2014  . Former smoker   . Functional asplenia   . Hb-SS disease with crisis ([place])   . History of Clostridium difficile infection   . History of pulmonary embolus (PE)   . Hypertension   . Hypokalemia   . Leukocytosis    Chronic  . Mood disorder ([place])   . Noncompliance with medication regimen   . Oxygen deficiency   . Pulmonary hypertension   . Second hand tobacco smoke exposure   . Sickle cell anemia (HCC)   . Sickle-cell crisis with associated acute chest syndrome ([place]) 05/13/2013  . Stroke ([place])   . Thrombocytosis (HCC)    Chronic  . Uses marijuana     Patient Active Problem List   Diagnosis Date Noted  . Abdominal pain   . Colitis 12/15/2015  . Slurred speech 11/10/2015  . Pulmonary emboli ([place]) 11/10/2015  . Aphasia   . Acute chest syndrome ([place])   . Ischemic stroke ([place])   . Sickle cell crisis ([place]) 10/27/2015  . Sickle cell pain crisis  ([place]) 09/18/2015  . Sickle cell anemia with crisis ([place]) 09/04/2015  . Shortness of breath   . Pulmonary embolism without acute cor pulmonale ([place])   . Fever   . Sickle cell anemia with pain ([place]) 05/07/2015  . Acute kidney injury ([place])   . Hypoxia   . Anemia 01/31/2015  . Chronic anemia   . Hb-SS disease without crisis ([place]) 10/07/2014  . Acute on chronic respiratory failure with hypoxia ([place])   . PVC's (premature ventricular contractions) 09/10/2014  . Abnormal EKG 09/10/2014  . Chronic atrial fibrillation ([address]) 09/09/2014  . Cor pulmonale, chronic ([place]) 08/25/2014  . Sickle cell anemia ([place]) 06/25/2014  . Anemia of chronic disease 06/25/2014  . PAH (pulmonary artery hypertension) 03/18/2014  . Hb-SS disease with crisis ([place]) 01/22/2014  . Pulmonary embolus ([place]) 01/01/2014  . Paralytic strabismus, external ophthalmoplegia   . Chronic pain syndrome 12/12/2013  . Elevated troponin 11/26/2013  . Chronic anticoagulation 08/22/2013  . Essential hypertension 08/22/2013  . Pulmonary HTN 06/18/2013  . Functional asplenia   . Vitamin D deficiency 02/13/2013  . Hx of pulmonary embolus 06/29/2012  . Hemochromatosis 12/14/2011  . Avascular necrosis [facility])     Past Surgical History:  Procedure Laterality Date  . CHOLECYSTECTOMY     01/2008  . Excision of left periauricular cyst     10/2009  . Excision  of right ear lobe cyst with primary closur     11/2007  . Porta cath placement    . Porta cath removal    . PORTACATH PLACEMENT  01/05/2012   Procedure: INSERTION PORT-A-CATH;  Surgeon: Odis Hollingshead, MD;  Location: [facility];  Service: General;  Laterality: N/A;  ultrasound guiced port a cath insertion with fluoroscopy  . Right hip replacement     08/2006  . UMBILICAL HERNIA REPAIR     01/2008       Home Medications    Prior to Admission medications   Medication Sig Start Date End Date Taking? Authorizing Provider  ambrisentan (LETAIRIS) 5 MG tablet Take 5 mg by mouth at  bedtime.   Yes Historical Provider, MD  aspirin 81 MG chewable tablet Chew 1 tablet (81 mg total) by mouth daily. 07/23/14  Yes [name], MD  cholecalciferol (VITAMIN D) 1000 units tablet Take 2,000 Units by mouth daily.   Yes Historical Provider, MD  Deferasirox (JADENU) 360 MG TABS Take 1,080 mg by mouth daily.    Yes Historical Provider, MD  folic acid (FOLVITE) 1 MG tablet Take 1 mg by mouth daily.   Yes Historical Provider, MD  gabapentin (NEURONTIN) 300 MG capsule Take 1 capsule (300 mg total) by mouth 3 (three) times daily. 06/01/15  Yes Tresa Garter, MD  hydroxyurea (HYDREA) 500 MG capsule Take 2 capsules (1,000 mg total) by mouth daily. May take with food to minimize GI side effects. Hold Hydrea until evaluated by Primary Provider or Hematologist. 12/22/15  Yes [name], MD  lisinopril (PRINIVIL,ZESTRIL) 5 MG tablet Take 1 tablet (5 mg total) by mouth daily. 08/04/15  Yes [name], MD  morphine (MS CONTIN) 30 MG 12 hr tablet Take 1 tablet (30 mg total) by mouth every 12 (twelve) hours. 12/29/15 01/28/16 Yes Olugbemiga Essie Christine, MD  potassium chloride SA (K-DUR,KLOR-CON) 20 MEQ tablet Take 2 tablets (40 mEq total) by mouth daily. 12/23/15  Yes [name], MD  rivaroxaban (XARELTO) 20 MG TABS tablet Take 1 tablet (20 mg total) by mouth at bedtime. 12/29/15  Yes Tresa Garter, MD  torsemide (DEMADEX) 20 MG tablet Take 20 mg by mouth daily as needed (for weight gain greater than 3lbs.).    Yes Historical Provider, MD  zolpidem (AMBIEN) 10 MG tablet Take 1 tablet (10 mg total) by mouth at bedtime as needed for sleep. 12/29/15 01/28/16 Yes Tresa Garter, MD    Family History Family History  Problem Relation Age of Onset  . Sickle cell trait Mother   . Depression Mother   . Diabetes Mother   . Sickle cell trait Father   . Sickle cell trait Brother     Social History Social History  Substance Use Topics  . Smoking status: Former  Smoker    Packs/day: 0.50    Years: 10.00    Types: Cigarettes    Quit date: 05/29/2011  . Smokeless tobacco: Never Used  . Alcohol use No     Allergies   Patient has no known allergies.   Review of Systems Review of Systems  All other systems reviewed and are negative.    Physical Exam Updated Vital Signs BP 106/74   Pulse 75   Temp 97.8 F (36.6 C) (Oral)   Resp 15   Ht 6' (1.829 m)   Wt 182 lb (82.6 kg)   SpO2 96%   BMI 24.68 kg/m   Physical Exam  Constitutional: He is oriented to person, place, and time. He appears well-developed and well-nourished. No distress.  HENT:  Head: Normocephalic and atraumatic.  Right Ear: External ear normal.  Left Ear: External ear normal.  Eyes: Conjunctivae and EOM are normal. Pupils are equal, round, and reactive to light.  Neck: Normal range of motion and phonation normal. Neck supple.  Cardiovascular: Normal rate, regular rhythm and normal heart sounds.   Pulmonary/Chest: Effort normal and breath sounds normal. No respiratory distress. He exhibits no tenderness and no bony tenderness.  Abdominal: Soft. There is no tenderness.  Musculoskeletal: Normal range of motion.  Neurological: He is alert and oriented to person, place, and time. No cranial nerve deficit or sensory deficit. He exhibits normal muscle tone. Coordination normal.  Skin: Skin is warm, dry and intact.  Psychiatric: He has a normal mood and affect. His behavior is normal. Judgment and thought content normal.  Nursing note and vitals reviewed.    ED Treatments / Results  Labs (all labs ordered are listed, but only abnormal results are displayed) Labs Reviewed  URINALYSIS, ROUTINE W REFLEX MICROSCOPIC - Abnormal; Notable for the following:       Result Value   Hgb urine dipstick SMALL (*)    Squamous Epithelial / LPF 0-5 (*)    All other components within normal limits  CBC WITH DIFFERENTIAL/PLATELET - Abnormal; Notable for the following:    WBC 17.5 (*)      RBC 2.58 (*)    Hemoglobin 7.9 (*)    HCT 22.9 (*)    RDW 23.3 (*)    Platelets 503 (*)    Neutro Abs 13.6 (*)    Monocytes Absolute 1.9 (*)    All other components within normal limits  RETICULOCYTES - Abnormal; Notable for the following:    Retic Ct Pct 13.0 (*)    RBC. 2.58 (*)    Retic Count, Manual 335.4 (*)    All other components within normal limits  COMPREHENSIVE METABOLIC PANEL - Abnormal; Notable for the following:    Glucose, Bld 105 (*)    Total Bilirubin 4.3 (*)    All other components within normal limits    EKG  EKG Interpretation None       Radiology No results found.  Procedures Procedures (including critical care time)  Medications Ordered in ED Medications  0.45 % sodium chloride infusion ( Intravenous New Bag/Given 01/16/16 0400)  HYDROmorphone (DILAUDID) injection 2 mg (not administered)    Or  HYDROmorphone (DILAUDID) injection 2 mg (not administered)  ondansetron (ZOFRAN) injection 4 mg (4 mg Intravenous Given 01/16/16 0356)  diphenhydrAMINE (BENADRYL) capsule 25-50 mg (50 mg Oral Given 01/16/16 0357)  HYDROmorphone (DILAUDID) injection 2 mg (2 mg Intravenous Given 01/16/16 0357)    Or  HYDROmorphone (DILAUDID) injection 2 mg ( Subcutaneous See Alternative 01/16/16 0357)  HYDROmorphone (DILAUDID) injection 2 mg (2 mg Intravenous Given 01/16/16 0431)    Or  HYDROmorphone (DILAUDID) injection 2 mg ( Subcutaneous See Alternative 01/16/16 0431)  HYDROmorphone (DILAUDID) injection 2 mg (2 mg Intravenous Given 01/16/16 0508)    Or  HYDROmorphone (DILAUDID) injection 2 mg ( Subcutaneous See Alternative 01/16/16 [id number])     Initial Impression / Assessment and Plan / ED Course  I have reviewed the triage vital signs and the nursing notes.  Pertinent labs & imaging results that were available during my care of the patient were reviewed by me and considered in my medical decision making (see chart for details).  Clinical  Course     Medications   0.45 % sodium chloride infusion ( Intravenous New Bag/Given 01/16/16 0400)  HYDROmorphone (DILAUDID) injection 2 mg (not administered)    Or  HYDROmorphone (DILAUDID) injection 2 mg (not administered)  ondansetron (ZOFRAN) injection 4 mg (4 mg Intravenous Given 01/16/16 0356)  diphenhydrAMINE (BENADRYL) capsule 25-50 mg (50 mg Oral Given 01/16/16 0357)  HYDROmorphone (DILAUDID) injection 2 mg (2 mg Intravenous Given 01/16/16 0357)    Or  HYDROmorphone (DILAUDID) injection 2 mg ( Subcutaneous See Alternative 01/16/16 0357)  HYDROmorphone (DILAUDID) injection 2 mg (2 mg Intravenous Given 01/16/16 0431)    Or  HYDROmorphone (DILAUDID) injection 2 mg ( Subcutaneous See Alternative 01/16/16 0431)  HYDROmorphone (DILAUDID) injection 2 mg (2 mg Intravenous Given 01/16/16 0508)    Or  HYDROmorphone (DILAUDID) injection 2 mg ( Subcutaneous See Alternative 01/16/16 0508)    Patient Vitals for the past 24 hrs:  BP Temp Temp src Pulse Resp SpO2 Height Weight  01/16/16 0500 106/74 - - 75 15 96 % - -  01/16/16 0430 117/75 - - 91 25 95 % - -  01/16/16 0401 110/76 97.8 F (36.6 C) Oral 78 19 93 % - -  01/16/16 0400 - - - 87 20 (!) 86 % - -  01/16/16 0400 110/76 - - 85 16 (!) 86 % - -  01/16/16 0345 - - - 92 (!) 34 92 % - -  01/16/16 0330 112/81 - - - - - - -  01/16/16 0311 - - - 84 (!) 36 94 % - -  01/16/16 0308 129/78 - - - - - - -  01/16/16 0231 - - - - - - 6' (1.829 m) 182 lb (82.6 kg)  01/16/16 0230 119/83 98.1 F (36.7 C) Oral 95 18 99 % - -    5:44 AM Reevaluation with update and discussion. After initial assessment and treatment, an updated evaluation reveals He states his pain is 6/10 after 3 doses of hydromorphone. He is scratching, his left shoulder. He feels like he needs to be admitted. Lynda Capistran L   5:46 AM-Consult complete with Dr. Blaine Hamper. Patient case explained and discussed. He agrees to admit patient for further evaluation and treatment. Call ended at [place] Performed by: Richarda Blade Total critical care time: 35 minutes Critical care time was exclusive of separately billable procedures and treating other patients. Critical care was necessary to treat or prevent imminent or life-threatening deterioration. Critical care was time spent personally by me on the following activities: development of treatment plan with patient and/or surrogate as well as nursing, discussions with consultants, evaluation of patient's response to treatment, examination of patient, obtaining history from patient or surrogate, ordering and performing treatments and interventions, ordering and review of laboratory studies, ordering and review of radiographic studies, pulse oximetry and re-evaluation of patient's condition.   Final Clinical Impressions(s) / ED Diagnoses   Final diagnoses:  Right flank pain  Sickle cell anemia with crisis ([place])  Other chronic pain   Recurrent sickle cell crisis, with pain, and mild hemolysis. Doubt chest syndrome, serious bacterial infection or metabolic instability.  Nursing Notes Reviewed/ Care Coordinated Applicable Imaging Reviewed Interpretation of Laboratory Data incorporated into ED treatment  Plan: Admit  New Prescriptions New Prescriptions   No medications on file     Daleen Bo, MD 01/16/16 [phone]

## 2016-01-16 NOTE — ED Triage Notes (Signed)
Pt reports R side pain all day yesterday, took his meds, pain woke him up about 2 hours ago.  Pt reports this is where he normally hurts when he's having a crisis.

## 2016-01-17 DIAGNOSIS — D72829 Elevated white blood cell count, unspecified: Secondary | ICD-10-CM | POA: Diagnosis present

## 2016-01-17 DIAGNOSIS — Z818 Family history of other mental and behavioral disorders: Secondary | ICD-10-CM | POA: Diagnosis not present

## 2016-01-17 DIAGNOSIS — I10 Essential (primary) hypertension: Secondary | ICD-10-CM | POA: Diagnosis not present

## 2016-01-17 DIAGNOSIS — Z7901 Long term (current) use of anticoagulants: Secondary | ICD-10-CM | POA: Diagnosis not present

## 2016-01-17 DIAGNOSIS — Z86718 Personal history of other venous thrombosis and embolism: Secondary | ICD-10-CM | POA: Diagnosis not present

## 2016-01-17 DIAGNOSIS — D5700 Hb-SS disease with crisis, unspecified: Secondary | ICD-10-CM | POA: Diagnosis not present

## 2016-01-17 DIAGNOSIS — Z79891 Long term (current) use of opiate analgesic: Secondary | ICD-10-CM | POA: Diagnosis not present

## 2016-01-17 DIAGNOSIS — Z87891 Personal history of nicotine dependence: Secondary | ICD-10-CM | POA: Diagnosis not present

## 2016-01-17 DIAGNOSIS — J9611 Chronic respiratory failure with hypoxia: Secondary | ICD-10-CM | POA: Diagnosis present

## 2016-01-17 DIAGNOSIS — Z833 Family history of diabetes mellitus: Secondary | ICD-10-CM | POA: Diagnosis not present

## 2016-01-17 DIAGNOSIS — G8929 Other chronic pain: Secondary | ICD-10-CM | POA: Diagnosis present

## 2016-01-17 DIAGNOSIS — D571 Sickle-cell disease without crisis: Secondary | ICD-10-CM | POA: Diagnosis not present

## 2016-01-17 DIAGNOSIS — I69320 Aphasia following cerebral infarction: Secondary | ICD-10-CM | POA: Diagnosis not present

## 2016-01-17 DIAGNOSIS — I2721 Secondary pulmonary arterial hypertension: Secondary | ICD-10-CM | POA: Diagnosis present

## 2016-01-17 DIAGNOSIS — Z96641 Presence of right artificial hip joint: Secondary | ICD-10-CM | POA: Diagnosis present

## 2016-01-17 DIAGNOSIS — Z86711 Personal history of pulmonary embolism: Secondary | ICD-10-CM | POA: Diagnosis not present

## 2016-01-17 DIAGNOSIS — D638 Anemia in other chronic diseases classified elsewhere: Secondary | ICD-10-CM | POA: Diagnosis present

## 2016-01-17 NOTE — Progress Notes (Signed)
Patient has turned IV fluids off twice himself, instructed not to do that, and that he is not to touch the pump and if he needs anything related to the pump to call me and I will assist him. Pump has been locked multiple times from the back side, but the patient still turns the IV fluids off. Reece Levy, MD was paged and instructed this RN to educate the patient that the fluids are for hydration and to not touch the pump. That education was given to the patient by this RN. Will continue to monitor patient closely, as well as the IV pump.

## 2016-01-17 NOTE — Progress Notes (Signed)
SICKLE CELL SERVICE PROGRESS NOTE  [name] [id number] DOB: [birth year] DOA: 01/16/2016 PCP: [name], MD  Assessment/Plan: Principal Problem:   Hb-SS disease with crisis ([place]) Active Problems:   Chronic anticoagulation   Hx of pulmonary embolus   Essential hypertension   PAH (pulmonary artery hypertension)   Sickle cell pain crisis ([place])   Ischemic stroke (HCC)   Flank pain  1. Hb SS with crisis:  Will continue current regimen of Dilaudid via PCA, Toradol and Clinician assisted bolus doses of Dilaudid on a PRN basis. Decrease IVF to [facility].  2. Leukocytosis: No evidence of infection. I suspect that this is related to crisis.  3. Anemia of Chronic Disease: Hb stable. Will re-check labs tomorrow.  4. Chronic pain: Continue MS Contin 5. Chronic Respiratory Failure with Hypoxia: Pt normally uses Oxygen 2 L/min on a daily basis. Currently he is at baseline.  6. Pulmonary Hypertension: Continue Ambrisentan 7. 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  Code Status: Full Code Family Communication: N/A Disposition Plan: Not yet ready for discharge  [place].  Pager [phone]. If 7PM-7AM, please contact night-coverage.  01/17/2016, 5:23 PM  LOS: 0 days   [place] History: Pt still rates his pain as 7/10 and localized to ribs. He has PRN doses of Dilaudid prescribed but has not received them despite asking for them. He has an appointment for RBC pheresis on out patient basis at [name] tomorrow.   Consultants:  None  Procedures:  None  Antibiotics:  None   Objective: Vitals:   01/17/16 0806 01/17/16 1239 01/17/16 1320 01/17/16 1637  BP: 114/75 110/77  103/75  Pulse: 71 98  88  Resp: 14 13 17 15   Temp: 98.3 F (36.8 C) 98.1 F (36.7 C)  99 F (37.2 C)  TempSrc: Oral Oral  Oral  SpO2: 96% 96% 90% 95%  Weight:      Height:       Weight change: -2.722 kg (-6  lb)  Intake/Output Summary (Last 24 hours) at 01/17/16 1723 Last data filed at 01/17/16 1700  Gross per 24 hour  Intake          2378.75 ml  Output             1005 ml  Net          1373.75 ml    General: Alert, awake, oriented x3, in mild distress secondary to pain.  HEENT: [place]/AT PEERL, EOMI, anicteric. Neck: Trachea midline,  no masses, no thyromegal,y no JVD, no carotid bruit OROPHARYNX:  Moist, No exudate/ erythema/lesions.  Heart: Regular rate and rhythm.  Lungs: Clear to auscultation, no wheezing or rhonchi noted. No increased vocal fremitus resonant to percussion  Abdomen: Soft, nontender, nondistended, positive bowel sounds, no masses no hepatosplenomegaly noted..  Neuro: No focal neurological deficits noted cranial nerves II through XII grossly intact.  Strength at functional baseline in bilateral upper and lower extremities. Musculoskeletal: No warmth swelling or erythema around joints, no spinal tenderness noted. Psychiatric: Patient alert and oriented x3, good insight and cognition, good recent to remote recall.    Data Reviewed: Basic Metabolic Panel:  Recent Labs Lab 01/13/16 1155 01/16/16 0400  NA 137 137  K 4.4 3.6  CL 110 105  CO2 22 23  GLUCOSE 113* 105*  BUN 27* 10  CREATININE 1.03 0.76  CALCIUM 8.9 9.4   Liver Function Tests:  Recent Labs Lab 01/13/16 1155 01/16/16 0400  AST 55* 39  ALT  36 33  ALKPHOS 88 90  BILITOT 5.2* 4.3*  PROT 7.6 7.7  ALBUMIN 4.4 4.6   No results for input(s): LIPASE, AMYLASE in the last 168 hours. No results for input(s): AMMONIA in the last 168 hours. CBC:  Recent Labs Lab 01/13/16 1155 01/16/16 0400  WBC 11.2* 17.5*  NEUTROABS 8.2* 13.6*  HGB 7.9* 7.9*  HCT 23.0* 22.9*  MCV 87.8 88.8  PLT 375 503*   Cardiac Enzymes: No results for input(s): CKTOTAL, CKMB, CKMBINDEX, TROPONINI in the last 168 hours. BNP (last 3 results)  Recent Labs  08/02/15 2307 11/28/15 0520  BNP 503.2* 422.5*    ProBNP (last  3 results) No results for input(s): PROBNP in the last 8760 hours.  CBG: No results for input(s): GLUCAP in the last 168 hours.  Recent Results (from the past 240 hour(s))  MRSA PCR Screening     Status: None   Collection Time: 01/16/16 12:32 PM  Result Value Ref Range Status   MRSA by PCR NEGATIVE NEGATIVE Final    Comment:        The GeneXpert MRSA Assay (FDA approved for NASAL specimens only), is one component of a comprehensive MRSA colonization surveillance program. It is not intended to diagnose MRSA infection nor to guide or monitor treatment for MRSA infections.      Studies: Dg Chest 2 View  Result Date: 01/03/2016 CLINICAL DATA:  Chest and back pain for 5 days.  Sickle cell anemia. EXAM: CHEST  2 VIEW COMPARISON:  01/01/2016 FINDINGS: Mild to moderate cardiomegaly remains stable. Left-sided power port remains in appropriate position. Chronic pulmonary vascular congestion is again demonstrated. No evidence of acute infiltrate or pulmonary edema. No evidence of pleural effusion. IMPRESSION: Stable cardiomegaly and pulmonary venous hypertension. No acute findings. Electronically Signed   By: Earle Gell M.D.   On: 01/03/2016 15:18   Dg Chest 2 View  Result Date: 01/01/2016 CLINICAL DATA:  Sickle cell pain crisis. Bilateral anterior rib pain. Initial encounter. EXAM: CHEST  2 VIEW COMPARISON:  Chest radiograph performed 12/27/2015 FINDINGS: The lungs are well-aerated and clear. There is no evidence of focal opacification, pleural effusion or pneumothorax. The heart is mildly enlarged. A left-sided chest port is noted ending about the distal SVC. No acute osseous abnormalities are seen. IMPRESSION: Mild cardiomegaly. Lungs remain grossly clear. No displaced rib fracture seen. Electronically Signed   By: Garald Balding M.D.   On: 01/01/2016 02:32   Dg Chest 2 View  Result Date: 12/27/2015 CLINICAL DATA:  Bilateral rib pain since 3 p.m. today. History of sickle cell. History of  acute chest syndrome. EXAM: CHEST  2 VIEW COMPARISON:  12/21/2015 FINDINGS: Power port type central venous catheter with tip over the cavoatrial junction region. No pneumothorax. No change in position since prior study. Cardiac enlargement without vascular congestion. No focal consolidation or edema. No blunting of costophrenic angles. No pneumothorax. Mediastinal contours appear intact. IMPRESSION: Cardiac enlargement. No evidence of active pulmonary disease. No focal consolidation. Electronically Signed   By: Lucienne Capers M.D.   On: 12/27/2015 06:47   Dg Chest 2 View  Result Date: 12/21/2015 CLINICAL DATA:  Hypoxia with shortness of breath history of sickle cell EXAM: CHEST  2 VIEW COMPARISON:  11/27/2015 FINDINGS: A left-sided central venous port tip overlies the distal SVC. Moderate-to-marked cardiomegaly is similar compared to the prior study. No effusion. No consolidation. No pneumothorax. Small amount of atelectasis at the left base. IMPRESSION: Cardiomegaly without acute infiltrate or overt pulmonary edema. Electronically  Signed   By: Donavan Foil M.D.   On: 12/21/2015 15:36    Scheduled Meds: . ambrisentan  5 mg Oral QHS  . aspirin  81 mg Oral Daily  . cholecalciferol  2,000 Units Oral Daily  . Deferasirox  1,080 mg Oral Daily  . folic acid  1 mg Oral Daily  . gabapentin  300 mg Oral TID  . HYDROmorphone   Intravenous Q4H  . HYDROmorphone  4 mg Oral Q4H  . hydroxyurea  1,000 mg Oral Daily  . ketorolac  30 mg Intravenous Q6H  . lisinopril  5 mg Oral Daily  . morphine  30 mg Oral Q12H  . rivaroxaban  20 mg Oral Q supper  . senna-docusate  1 tablet Oral BID   Continuous Infusions: . dextrose 5 % and 0.45% NaCl 75 mL/hr at 01/16/16 1853    Principal Problem:   Hb-SS disease with crisis [facility]) Active Problems:   Chronic anticoagulation   Hx of pulmonary embolus   Essential hypertension   PAH (pulmonary artery hypertension)   Sickle cell pain crisis ([place])   Ischemic stroke  (HCC)   Flank pain      In excess of 25 minutes spent during this visit. Greater than 50% involved face to face contact with the patient for assessment, counseling and coordination of care.

## 2016-01-17 NOTE — Plan of Care (Signed)
Problem: Pain Managment: Goal: General experience of comfort will improve Outcome: Not Progressing Patient continually reports pain of 6/7 out of 10 despite PCA use, bolus doses, and other scheduled pain medications. Continues to ask for bolus doses frequently when they are not able to be given yet.

## 2016-01-18 DIAGNOSIS — D571 Sickle-cell disease without crisis: Secondary | ICD-10-CM | POA: Diagnosis not present

## 2016-01-18 DIAGNOSIS — D5700 Hb-SS disease with crisis, unspecified: Secondary | ICD-10-CM | POA: Diagnosis not present

## 2016-01-18 DIAGNOSIS — Z86718 Personal history of other venous thrombosis and embolism: Secondary | ICD-10-CM | POA: Diagnosis not present

## 2016-01-18 MED ORDER — SODIUM CHLORIDE 0.9% FLUSH
10.0000 mL | INTRAVENOUS | Status: DC | PRN
  Administered 2016-01-18: 10 mL
  Filled 2016-01-18: qty 40

## 2016-01-18 MED ORDER — HEPARIN SOD (PORK) LOCK FLUSH 100 UNIT/ML IV SOLN
500.0000 [IU] | INTRAVENOUS | Status: AC | PRN
  Administered 2016-01-18: 500 [IU]

## 2016-01-18 NOTE — Progress Notes (Signed)
Pt. Left AMA Port-a-cath deacessed AMA papers signed and in the chart.

## 2016-01-19 ENCOUNTER — Telehealth (HOSPITAL_COMMUNITY): Payer: Self-pay | Admitting: *Deleted

## 2016-01-19 ENCOUNTER — Encounter (HOSPITAL_COMMUNITY): Payer: Self-pay | Admitting: *Deleted

## 2016-01-19 ENCOUNTER — Non-Acute Institutional Stay (HOSPITAL_COMMUNITY)
Admission: AD | Admit: 2016-01-19 | Discharge: 2016-01-19 | Disposition: A | Discharge status: Home / Self Care | Payer: Medicare Other | Source: Ambulatory Visit | Attending: Internal Medicine | Admitting: Internal Medicine

## 2016-01-19 DIAGNOSIS — Z79891 Long term (current) use of opiate analgesic: Secondary | ICD-10-CM | POA: Insufficient documentation

## 2016-01-19 DIAGNOSIS — Z8673 Personal history of transient ischemic attack (TIA), and cerebral infarction without residual deficits: Secondary | ICD-10-CM | POA: Diagnosis not present

## 2016-01-19 DIAGNOSIS — I272 Pulmonary hypertension, unspecified: Secondary | ICD-10-CM | POA: Diagnosis not present

## 2016-01-19 DIAGNOSIS — Z96641 Presence of right artificial hip joint: Secondary | ICD-10-CM | POA: Diagnosis not present

## 2016-01-19 DIAGNOSIS — D5700 Hb-SS disease with crisis, unspecified: Secondary | ICD-10-CM | POA: Diagnosis not present

## 2016-01-19 DIAGNOSIS — Z7982 Long term (current) use of aspirin: Secondary | ICD-10-CM | POA: Diagnosis not present

## 2016-01-19 DIAGNOSIS — Z86711 Personal history of pulmonary embolism: Secondary | ICD-10-CM | POA: Insufficient documentation

## 2016-01-19 DIAGNOSIS — M79605 Pain in left leg: Secondary | ICD-10-CM | POA: Diagnosis present

## 2016-01-19 DIAGNOSIS — Z79899 Other long term (current) drug therapy: Secondary | ICD-10-CM | POA: Insufficient documentation

## 2016-01-19 DIAGNOSIS — Z7901 Long term (current) use of anticoagulants: Secondary | ICD-10-CM | POA: Diagnosis not present

## 2016-01-19 DIAGNOSIS — I10 Essential (primary) hypertension: Secondary | ICD-10-CM | POA: Diagnosis not present

## 2016-01-19 DIAGNOSIS — Z87891 Personal history of nicotine dependence: Secondary | ICD-10-CM | POA: Insufficient documentation

## 2016-01-19 MED ORDER — DIPHENHYDRAMINE HCL 25 MG PO CAPS
25.0000 mg | ORAL_CAPSULE | ORAL | Status: DC | PRN
  Administered 2016-01-19: 50 mg via ORAL
  Filled 2016-01-19: qty 2

## 2016-01-19 MED ORDER — NALOXONE HCL 0.4 MG/ML IJ SOLN: 0.4000 mg | INTRAMUSCULAR | Status: DC | PRN

## 2016-01-19 MED ORDER — SODIUM CHLORIDE 0.9% FLUSH
10.0000 mL | INTRAVENOUS | Status: AC | PRN
  Administered 2016-01-19: 10 mL

## 2016-01-19 MED ORDER — SODIUM CHLORIDE 0.9% FLUSH: 9.0000 mL | INTRAVENOUS | Status: DC | PRN

## 2016-01-19 MED ORDER — SODIUM CHLORIDE 0.9 % IV SOLN
25.0000 mg | INTRAVENOUS | Status: DC | PRN
  Filled 2016-01-19: qty 0.5

## 2016-01-19 MED ORDER — ONDANSETRON HCL 4 MG/2ML IJ SOLN: 4.0000 mg | Freq: Four times a day (QID) | INTRAMUSCULAR | Status: DC | PRN

## 2016-01-19 MED ORDER — HYDROMORPHONE 1 MG/ML IV SOLN
INTRAVENOUS | Status: DC
  Administered 2016-01-19: 12:00:00 via INTRAVENOUS
  Administered 2016-01-19: 13.8 mg via INTRAVENOUS
  Filled 2016-01-19: qty 25

## 2016-01-19 MED ORDER — DEXTROSE-NACL 5-0.45 % IV SOLN
INTRAVENOUS | Status: DC
  Administered 2016-01-19: 12:00:00 via INTRAVENOUS

## 2016-01-19 MED ORDER — HEPARIN SOD (PORK) LOCK FLUSH 100 UNIT/ML IV SOLN
500.0000 [IU] | INTRAVENOUS | Status: AC | PRN
  Administered 2016-01-19: 500 [IU]
  Filled 2016-01-19: qty 5

## 2016-01-19 NOTE — Progress Notes (Signed)
Pt received to the [facility] for treatment. Pt stated his pain was in his legs and ankles. Pt was treated with a Dilaudid PCA and IV fluids. Pt's pain # was 9/10 on admission and down to 6/10 at discharge. Pt also used a heating pad on his ankles. Pt received discharge instructions with verbal understanding. Pt was alert, oriented and ambulatory at discharge. He was discharge with family taking him home.

## 2016-01-19 NOTE — Discharge Summary (Signed)
[name] MRN: [id number] DOB/AGE: September 03, 1980 35 y.o.  Admit date: 01/16/2016 Discharge date: 01/19/2016  Primary Care Physician:  [name], MD   Discharge Diagnoses:   Patient Active Problem List   Diagnosis Date Noted  . Cor pulmonale, chronic ([place]) 08/25/2014    Priority: High  . Pulmonary HTN 06/18/2013    Priority: High  . Functional asplenia     Priority: High  . Anemia of chronic disease 06/25/2014    Priority: Medium  . Hb-SS disease with crisis ([place]) 01/22/2014    Priority: Medium  . Chronic anticoagulation 08/22/2013    Priority: Medium  . Avascular necrosis ([place])     Priority: Low  . Flank pain 01/16/2016  . Abdominal pain   . Colitis 12/15/2015  . Pulmonary emboli ([place]) 11/10/2015  . Aphasia   . Acute chest syndrome ([place])   . Ischemic stroke ([place])   . Sickle cell crisis ([place]) 10/27/2015  . Sickle cell pain crisis ([place]) 09/18/2015  . Sickle cell anemia with crisis ([place]) 09/04/2015  . Shortness of breath   . Sickle cell anemia with pain ([place]) 05/07/2015  . Chronic anemia   . Sickle cell anemia ([place]) 06/25/2014  . Paralytic strabismus, external ophthalmoplegia   . Chronic pain syndrome 12/12/2013  . Essential hypertension 08/22/2013  . Vitamin D deficiency 02/13/2013  . Hx of pulmonary embolus 06/29/2012  . Hemochromatosis 12/14/2011    DISCHARGE MEDICATION: Not reconciled. Pt left  AGAINST MEDICAL ADVICE    Consults:    SIGNIFICANT DIAGNOSTIC STUDIES:  Dg Chest 2 View  Result Date: 01/03/2016 CLINICAL DATA:  Chest and back pain for 5 days.  Sickle cell anemia. EXAM: CHEST  2 VIEW COMPARISON:  01/01/2016 FINDINGS: Mild to moderate cardiomegaly remains stable. Left-sided power port remains in appropriate position. Chronic pulmonary vascular congestion is again demonstrated. No evidence of acute infiltrate or pulmonary edema. No evidence of pleural effusion. IMPRESSION: Stable cardiomegaly and pulmonary venous hypertension. No acute  findings. Electronically Signed   By: Earle Gell M.D.   On: 01/03/2016 15:18   Dg Chest 2 View  Result Date: 01/01/2016 CLINICAL DATA:  Sickle cell pain crisis. Bilateral anterior rib pain. Initial encounter. EXAM: CHEST  2 VIEW COMPARISON:  Chest radiograph performed 12/27/2015 FINDINGS: The lungs are well-aerated and clear. There is no evidence of focal opacification, pleural effusion or pneumothorax. The heart is mildly enlarged. A left-sided chest port is noted ending about the distal SVC. No acute osseous abnormalities are seen. IMPRESSION: Mild cardiomegaly. Lungs remain grossly clear. No displaced rib fracture seen. Electronically Signed   By: Garald Balding M.D.   On: 01/01/2016 02:32   Dg Chest 2 View  Result Date: 12/27/2015 CLINICAL DATA:  Bilateral rib pain since 3 p.m. today. History of sickle cell. History of acute chest syndrome. EXAM: CHEST  2 VIEW COMPARISON:  12/21/2015 FINDINGS: Power port type central venous catheter with tip over the cavoatrial junction region. No pneumothorax. No change in position since prior study. Cardiac enlargement without vascular congestion. No focal consolidation or edema. No blunting of costophrenic angles. No pneumothorax. Mediastinal contours appear intact. IMPRESSION: Cardiac enlargement. No evidence of active pulmonary disease. No focal consolidation. Electronically Signed   By: Lucienne Capers M.D.   On: 12/27/2015 06:47   Dg Chest 2 View  Result Date: 12/21/2015 CLINICAL DATA:  Hypoxia with shortness of breath history of sickle cell EXAM: CHEST  2 VIEW COMPARISON:  11/27/2015 FINDINGS: A left-sided central venous port tip overlies the distal  SVC. Moderate-to-marked cardiomegaly is similar compared to the prior study. No effusion. No consolidation. No pneumothorax. Small amount of atelectasis at the left base. IMPRESSION: Cardiomegaly without acute infiltrate or overt pulmonary edema. Electronically Signed   By: Donavan Foil M.D.   On: 12/21/2015  15:36       Recent Results (from the past 240 hour(s))  MRSA PCR Screening     Status: None   Collection Time: 01/16/16 12:32 PM  Result Value Ref Range Status   MRSA by PCR NEGATIVE NEGATIVE Final    Comment:        The GeneXpert MRSA Assay (FDA approved for NASAL specimens only), is one component of a comprehensive MRSA colonization surveillance program. It is not intended to diagnose MRSA infection nor to guide or monitor treatment for MRSA infections.     BRIEF ADMITTING H & P: [name] is a 35 y.o. male with medical history significant of  sickle cell disease, stroke with aphasia, acute chest syndrome, DVT, PE on Xarelto, pulmonary hypertension, cor pulmonale, chronic respiratory failure, C diff colitis, who presents with bilateral rib cages, flankand back pain.   Patient states that he has worsening pain since yesterday, which has been progressively getting worse. His pain is mainly locatedin the bilateral sidesof hisrib cages, flank and back. It is constant, 9out of 10 in severity, nonradiating. It is not aggravated or alleviated by any known factors. States that his home pain medication does not help anymore. He does not have chest pain or shortness of breath. No cough, no fever or chills. Patient denies nausea, vomiting, diarrhea, abdominal pain, symptoms of UTI or new unilateral weakness. 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 No rashes or leg edema.  ED Course: pt was found to have WBC 17.4, hemoglobin 7.9, negative urinalysis, electrolytes renal function okay, temp normal, oxygen saturation 86% on room air, which improved to 94% on 2 L nasal cannula oxygen. Patient is placed on telemetry bed for observation   Hospital Course:  Present on Admission: . Flank pain . Sickle cell pain crisis ([place]) . Essential hypertension . Hb-SS disease with crisis ([place]) . Hx of pulmonary embolus . Ischemic stroke ([place]) .  PAH (pulmonary artery hypertension)  Pt was admitted with Sickle Cell crisis. His pain was being managed with Dilaudid via  PCA, Clinician assisted doses of Dilaudid and Toradol. He was beginning to improve however he ha an appointment [facility] and per nursing  left [facility] at about 5:30 am on Monday 01/18/2016.   Disposition and Follow-up: Pt left AGAINST MEDICAL ADVICE.   DISCHARGE EXAM:  Not performed. Patient left AGAINST MEDICAL ADVICE.   Blood pressure (!) 109/92, pulse 97, temperature 98.9 F (37.2 C), temperature source Oral, resp. rate 18, height 6' (1.829 m), weight 81.8 kg (180 lb 6.4 oz), SpO2 94 %.  No results for input(s): NA, K, CL, CO2, GLUCOSE, BUN, CREATININE, CALCIUM, MG, PHOS in the last 72 hours. No results for input(s): AST, ALT, ALKPHOS, BILITOT, PROT, ALBUMIN in the last 72 hours. No results for input(s): LIPASE, AMYLASE in the last 72 hours. No results for input(s): WBC, NEUTROABS, HGB, HCT, MCV, PLT in the last 72 hours.   Total time spent including face to face and decision making was less than 30 minutes  Signed: Helyne Genther A. 01/19/2016, 1:49 PM

## 2016-01-19 NOTE — Telephone Encounter (Signed)
Called complaining of pain in both ankles and legs. Denies fever, chest pain or abdominal pain, N/V. Took Dilaudid at 6 am and 9 am and MS Contin at 6 am without relief.

## 2016-01-19 NOTE — Telephone Encounter (Signed)
[name] Robert advises that patient come in for treatment to [facility]. Patient notified and will be here in 20 minutes.

## 2016-01-19 NOTE — H&P (Signed)
[facility] History and Physical   Date: 01/19/2016  Patient name: [name] Medical record number: [id number] Date of birth: [birth year] Age: 35 y.o. Gender: male PCP: [name], MD  Attending physician: Tresa Garter, MD  Chief Complaint: Lower extremity pain  History of Present Illness: [name], a 35 year old male with a history of sickle cell anemia, HbSS presents with lower extremity pain. He states that pain started several days ago and is consistent with typical sickle cell pain.  He has not identified any palliative or provocative factors. Current pain intensity is 8/10 primarily to legs and ankles. Patient was evaluated in the emergency department 3 days ago. He has been taking home medications consistently without sustained relief. He currently denies headache, neuropathy, shortness of breath, dysuria, nausea, vomiting, or diarrhea.   Meds: Prescriptions Prior to Admission  Medication Sig Dispense Refill Last Dose  . ambrisentan (LETAIRIS) 5 MG tablet Take 5 mg by mouth at bedtime.   01/15/2016 at Unknown time  . aspirin 81 MG chewable tablet Chew 1 tablet (81 mg total) by mouth daily. 30 tablet 11 01/15/2016 at 1200  . cholecalciferol (VITAMIN D) 1000 units tablet Take 2,000 Units by mouth daily.   01/15/2016 at Unknown time  . Deferasirox (JADENU) 360 MG TABS Take 1,080 mg by mouth daily.    01/15/2016 at Unknown time  . folic acid (FOLVITE) 1 MG tablet Take 1 mg by mouth daily.   01/15/2016 at Unknown time  . gabapentin (NEURONTIN) 300 MG capsule Take 1 capsule (300 mg total) by mouth 3 (three) times daily. 270 capsule 3 01/15/2016 at Unknown time  . hydroxyurea (HYDREA) 500 MG capsule Take 2 capsules (1,000 mg total) by mouth daily. May take with food to minimize GI side effects. Hold Hydrea until evaluated by Primary Provider or Hematologist.   01/15/2016 at Unknown time  . lisinopril (PRINIVIL,ZESTRIL) 5 MG tablet Take 1 tablet  (5 mg total) by mouth daily. 30 tablet 1 01/15/2016 at Unknown time  . morphine (MS CONTIN) 30 MG 12 hr tablet Take 1 tablet (30 mg total) by mouth every 12 (twelve) hours. 60 tablet 0 01/15/2016 at Unknown time  . potassium chloride SA (K-DUR,KLOR-CON) 20 MEQ tablet Take 2 tablets (40 mEq total) by mouth daily. 30 tablet 1 01/15/2016 at Unknown time  . rivaroxaban (XARELTO) 20 MG TABS tablet Take 1 tablet (20 mg total) by mouth at bedtime. 30 tablet 3 01/15/2016 at 1800  . torsemide (DEMADEX) 20 MG tablet Take 20 mg by mouth daily as needed (for weight gain greater than 3lbs.).    Past Week at Unknown time  . zolpidem (AMBIEN) 10 MG tablet Take 1 tablet (10 mg total) by mouth at bedtime as needed for sleep. 30 tablet 0 Past Week at Unknown time    Allergies: Patient has no known allergies. Past Medical History:  Diagnosis Date  . Acute chest syndrome ([place]) 06/18/2013  . Acute embolism and thrombosis of right internal jugular vein ([place])   . Alcohol consumption of one to four drinks per day   . Avascular necrosis ([facility])    Right Hip  . Blood transfusion   . Chronic anticoagulation   . Demand ischemia ([place]) 01/02/2014  . Former smoker   . Functional asplenia   . Hb-SS disease with crisis ([place])   . History of Clostridium difficile infection   . History of pulmonary embolus (PE)   . Hypertension   . Hypokalemia   .  Leukocytosis    Chronic  . Mood disorder ([place])   . Noncompliance with medication regimen   . Oxygen deficiency   . Pulmonary hypertension   . Second hand tobacco smoke exposure   . Sickle cell anemia (H[facility])   . Sickle-cell crisis with associated acute chest syndrome ([place]) 05/13/2013  . Stroke ([place])   . Thrombocytosis ([facility])    Chronic  . Uses marijuana    Past Surgical History:  Procedure Laterality Date  . CHOLECYSTECTOMY     01/2008  . Excision of left periauricular cyst     10/2009  . Excision of right ear lobe cyst with primary closur     11/2007  . Porta cath  placement    . Porta cath removal    . PORTACATH PLACEMENT  01/05/2012   Procedure: INSERTION PORT-A-CATH;  Surgeon: Odis Hollingshead, MD;  Location: [place];  Service: General;  Laterality: N/A;  ultrasound guiced port a cath insertion with fluoroscopy  . Right hip replacement     08/2006  . UMBILICAL HERNIA REPAIR     01/2008   Family History  Problem Relation Age of Onset  . Sickle cell trait Mother   . Depression Mother   . Diabetes Mother   . Sickle cell trait Father   . Sickle cell trait Brother    Social History   Social History  . Marital status: Single    Spouse name: N/A  . Number of children: 0  . Years of education: 36   Occupational History  . 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 Smoking status: Former Smoker    Packs/day: 0.50    Years: 10.00    Types: Cigarettes    Quit date: 05/29/2011  . Smokeless tobacco: Never Used  . Alcohol use No  . Drug use: No     Comment: Marijuana weekly  . Sexual activity: Yes    Partners: Female     Comment: month ago   Other Topics Concern  . Not on file   Social History Narrative   Lives in an apartment.  Single.  Lives alone but has a girlfriend that helps care for him.  Does not use any assist devices.        Gerald [id number]  [phone] Mom, emergency contact      [facility]:   Patient continuing to live in her apartment in town alone. 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    Review of Systems: Eyes: positive for icterus Ears, nose, mouth, throat, and face: negative for earaches, hoarseness and nasal congestion Respiratory: negative for cough and dyspnea on exertion Cardiovascular: positive for fatigue Gastrointestinal: negative for constipation and nausea Genitourinary:negative for dysuria Integument/breast: negative Hematologic/lymphatic: negative Musculoskeletal:positive for myalgias Neurological: negative Behavioral/Psych:  negative Endocrine: negative  Physical Exam: There were no vitals taken for this visit. BP 114/83 (BP Location: Right Arm, Patient Position: Sitting, Cuff Size: Normal)   Pulse 92   Temp 98.6 F (37 C) (Oral)   Resp 20   Ht 6' (1.829 m)   Wt 180 lb (81.6 kg)   SpO2 97%   BMI 24.41 kg/m   General Appearance:    Alert, cooperative, mild distress, appears older thanstated age  Head:    Normocephalic, without obvious abnormality, atraumatic  Eyes:    PERRL, conjunctiva/corneas clear, EOM's intact, fundi    Benign, icterus both eyes  Ears:    Normal TM's and external ear canals, both ears  Neck:   Supple, symmetrical, trachea midline, no adenopathy;       thyroid:  No enlargement/tenderness/nodules; no carotid   bruit or JVD  Back:     Symmetric, no curvature, ROM normal, no CVA tenderness  Lungs:     Clear to auscultation bilaterally, respirations unlabored  Chest wall:    No tenderness or deformity  Heart:    Irregular rate and rhythm  Abdomen:     Soft, bowel sounds active all four quadrants,    no masses, no organomegaly  Extremities:   Extremities normal, atraumatic, no cyanosis or edema  Pulses:   2+ and symmetric all extremities  Skin:   Skin color, texture, turgor normal, no rashes or lesions  Lymph nodes:   Cervical, supraclavicular, and axillary nodes normal  Neurologic:   CNII-XII intact. Normal strength, sensation and reflexes      throughout     Lab results:No results found. However, due to the size of the patient record, not all encounters were searched. Please check Results Review for a complete set of results.  Imaging results:  No results found.   Assessment & Plan:  Patient will be admitted to the day infusion center for extended observation  Start IV D5.45 for cellular rehydration at 125/hr  Will hold Toradol for inflammation due to anti-coagulation therapy  Start Dilaudid PCA High Concentration per weight based protocol.   Patient will be  re-evaluated for pain intensity in the context of function and relationship to baseline as care progresses.  If no significant pain relief, will transfer patient to inpatient services for a higher level of care.   Reviewed labs, consistent with previous values   Hollis,Lachina M 01/19/2016, 11:29 AM

## 2016-01-19 NOTE — Discharge Summary (Signed)
Physician Discharge Summary  [name] [id number] DOB: [birth year] DOA: 01/19/2016  PCP: [name], MD  Admit date: 01/19/2016  Discharge date: 01/19/2016  Time spent: 30 minutes  Discharge Diagnoses:  Active Problems:   Sickle cell anemia with pain [facility])   Discharge Condition: Stable  Diet recommendation: Regular  Filed Weights   01/19/16 1141  Weight: 180 lb (81.6 kg)    History of present illness:  [name], a 35 year old male with a history of sickle cell anemia, HbSS presents with lower extremity pain. He states that pain started several days ago and is consistent with typical sickle cell pain.  He has not identified any palliative or provocative factors. Current pain intensity is 8/10 primarily to legs and ankles. Patient was evaluated in the emergency department 3 days ago. He has been taking home medications consistently without sustained relief. He currently denies headache, neuropathy, shortness of breath, dysuria, nausea, vomiting, or diarrhea.   Hospital Course:  [name] was admitted to the day hospital with sickle cell painful crisis. Patient was treated with weight based IV Dilaudid PCA, IV Toradol as well as IV fluids. [name] showed significant improvement symptomatically, pain improved from 9 to 5/10 at the time of discharge. Patient was discharged home in a hemodynamically stable condition. [name] will follow-up at the clinic as previously scheduled, continue with home medications as per prior to admission.  Discharge Instructions We discussed the need for good hydration, monitoring of hydration status, avoidance of heat, cold, stress, and infection triggers. We discussed the need to be compliant with taking Hydrea. [name] was reminded of the need to seek medical attention of any symptoms of bleeding, anemia, or infection occurs.  Discharge Exam: Vitals:   01/19/16 1500 01/19/16 1601  BP: 106/81 106/70  Pulse: (!) 106  (!) 103  Resp: (!) 21 20  Temp:      General appearance: alert, cooperative and no distress Eyes: Icterus++. PERRL, EOM's intact. Fundi benign. Neck: no adenopathy, no carotid bruit, no JVD, supple, symmetrical, trachea midline and thyroid not enlarged, symmetric, no tenderness/mass/nodules Back: symmetric, no curvature. ROM normal. No CVA tenderness. Resp: clear to auscultation bilaterally Chest wall: no tenderness Cardio: Irregular rate and rhythm, S1, S2 normal, + murmur, no click, rub or gallop GI: soft, non-tender; bowel sounds normal; no masses, no organomegaly Extremities: extremities normal, atraumatic, no cyanosis or edema Pulses: 2+ and symmetric Skin: Skin color, texture, turgor normal. No rashes or lesions Neurologic: Grossly normal   Current Discharge Medication List    CONTINUE these medications which have NOT CHANGED   Details  ambrisentan (LETAIRIS) 5 MG tablet Take 5 mg by mouth at bedtime.    aspirin 81 MG chewable tablet Chew 1 tablet (81 mg total) by mouth daily. Qty: 30 tablet, Refills: 11    cholecalciferol (VITAMIN D) 1000 units tablet Take 2,000 Units by mouth daily.    Deferasirox (JADENU) 360 MG TABS Take 1,080 mg by mouth daily.     folic acid (FOLVITE) 1 MG tablet Take 1 mg by mouth daily.    gabapentin (NEURONTIN) 300 MG capsule Take 1 capsule (300 mg total) by mouth 3 (three) times daily. Qty: 270 capsule, Refills: 3    hydroxyurea (HYDREA) 500 MG capsule Take 2 capsules (1,000 mg total) by mouth daily. May take with food to minimize GI side effects. Hold Hydrea until evaluated by Primary Provider or Hematologist.    lisinopril (PRINIVIL,ZESTRIL) 5 MG tablet Take 1 tablet (5 mg total)  by mouth daily. Qty: 30 tablet, Refills: 1    morphine (MS CONTIN) 30 MG 12 hr tablet Take 1 tablet (30 mg total) by mouth every 12 (twelve) hours. Qty: 60 tablet, Refills: 0    potassium chloride SA (K-DUR,KLOR-CON) 20 MEQ tablet Take 2 tablets (40 mEq total)  by mouth daily. Qty: 30 tablet, Refills: 1    rivaroxaban (XARELTO) 20 MG TABS tablet Take 1 tablet (20 mg total) by mouth at bedtime. Qty: 30 tablet, Refills: 3    torsemide (DEMADEX) 20 MG tablet Take 20 mg by mouth daily as needed (for weight gain greater than 3lbs.).     zolpidem (AMBIEN) 10 MG tablet Take 1 tablet (10 mg total) by mouth at bedtime as needed for sleep. Qty: 30 tablet, Refills: 0   Associated Diagnoses: Primary insomnia       No Known Allergies   Significant Diagnostic Studies: Dg Chest 2 View  Result Date: 01/03/2016 CLINICAL DATA:  Chest and back pain for 5 days.  Sickle cell anemia. EXAM: CHEST  2 VIEW COMPARISON:  01/01/2016 FINDINGS: Mild to moderate cardiomegaly remains stable. Left-sided power port remains in appropriate position. Chronic pulmonary vascular congestion is again demonstrated. No evidence of acute infiltrate or pulmonary edema. No evidence of pleural effusion. IMPRESSION: Stable cardiomegaly and pulmonary venous hypertension. No acute findings. Electronically Signed   By: Earle Gell M.D.   On: 01/03/2016 15:18   Dg Chest 2 View  Result Date: 01/01/2016 CLINICAL DATA:  Sickle cell pain crisis. Bilateral anterior rib pain. Initial encounter. EXAM: CHEST  2 VIEW COMPARISON:  Chest radiograph performed 12/27/2015 FINDINGS: The lungs are well-aerated and clear. There is no evidence of focal opacification, pleural effusion or pneumothorax. The heart is mildly enlarged. A left-sided chest port is noted ending about the distal SVC. No acute osseous abnormalities are seen. IMPRESSION: Mild cardiomegaly. Lungs remain grossly clear. No displaced rib fracture seen. Electronically Signed   By: Garald Balding M.D.   On: 01/01/2016 02:32   Dg Chest 2 View  Result Date: 12/27/2015 CLINICAL DATA:  Bilateral rib pain since 3 p.m. today. History of sickle cell. History of acute chest syndrome. EXAM: CHEST  2 VIEW COMPARISON:  12/21/2015 FINDINGS: Power port type  central venous catheter with tip over the cavoatrial junction region. No pneumothorax. No change in position since prior study. Cardiac enlargement without vascular congestion. No focal consolidation or edema. No blunting of costophrenic angles. No pneumothorax. Mediastinal contours appear intact. IMPRESSION: Cardiac enlargement. No evidence of active pulmonary disease. No focal consolidation. Electronically Signed   By: Lucienne Capers M.D.   On: 12/27/2015 06:47   Dg Chest 2 View  Result Date: 12/21/2015 CLINICAL DATA:  Hypoxia with shortness of breath history of sickle cell EXAM: CHEST  2 VIEW COMPARISON:  11/27/2015 FINDINGS: A left-sided central venous port tip overlies the distal SVC. Moderate-to-marked cardiomegaly is similar compared to the prior study. No effusion. No consolidation. No pneumothorax. Small amount of atelectasis at the left base. IMPRESSION: Cardiomegaly without acute infiltrate or overt pulmonary edema. Electronically Signed   By: Donavan Foil M.D.   On: 12/21/2015 15:36    Signed:  [name] MD, MHA, FACP, [place], CPE   01/19/2016, 4:14 PM

## 2016-01-22 ENCOUNTER — Encounter (HOSPITAL_COMMUNITY): Payer: Self-pay | Admitting: Emergency Medicine

## 2016-01-22 ENCOUNTER — Emergency Department (HOSPITAL_COMMUNITY)
Admission: EM | Admit: 2016-01-22 | Discharge: 2016-01-22 | Disposition: A | Discharge status: Home / Self Care | Payer: Medicare Other | Attending: Emergency Medicine | Admitting: Emergency Medicine

## 2016-01-22 DIAGNOSIS — I10 Essential (primary) hypertension: Secondary | ICD-10-CM | POA: Diagnosis not present

## 2016-01-22 DIAGNOSIS — Z8673 Personal history of transient ischemic attack (TIA), and cerebral infarction without residual deficits: Secondary | ICD-10-CM | POA: Insufficient documentation

## 2016-01-22 DIAGNOSIS — D5700 Hb-SS disease with crisis, unspecified: Secondary | ICD-10-CM | POA: Diagnosis not present

## 2016-01-22 DIAGNOSIS — Z87891 Personal history of nicotine dependence: Secondary | ICD-10-CM | POA: Diagnosis not present

## 2016-01-22 DIAGNOSIS — Z79899 Other long term (current) drug therapy: Secondary | ICD-10-CM | POA: Insufficient documentation

## 2016-01-22 DIAGNOSIS — Z7982 Long term (current) use of aspirin: Secondary | ICD-10-CM | POA: Diagnosis not present

## 2016-01-22 DIAGNOSIS — Z7902 Long term (current) use of antithrombotics/antiplatelets: Secondary | ICD-10-CM | POA: Diagnosis not present

## 2016-01-22 LAB — CBC WITH DIFFERENTIAL/PLATELET
BASOS ABS: 0.1 10*3/uL (ref 0.0–0.1)
Basophils Relative: 1 %
EOS PCT: 2 %
Eosinophils Absolute: 0.3 10*3/uL (ref 0.0–0.7)
HCT: 23.3 % — ABNORMAL LOW (ref 39.0–52.0)
Hemoglobin: 7.8 g/dL — ABNORMAL LOW (ref 13.0–17.0)
LYMPHS ABS: 1.4 10*3/uL (ref 0.7–4.0)
LYMPHS PCT: 12 %
MCH: 30.2 pg (ref 26.0–34.0)
MCHC: 33.5 g/dL (ref 30.0–36.0)
MCV: 90.3 fL (ref 78.0–100.0)
MONO ABS: 1.4 10*3/uL — AB (ref 0.1–1.0)
Monocytes Relative: 12 %
NEUTROS ABS: 8.9 10*3/uL — AB (ref 1.7–7.7)
Neutrophils Relative %: 73 %
Platelets: 319 10*3/uL (ref 150–400)
RBC: 2.58 MIL/uL — AB (ref 4.22–5.81)
RDW: 19.8 % — ABNORMAL HIGH (ref 11.5–15.5)
WBC: 12 10*3/uL — AB (ref 4.0–10.5)

## 2016-01-22 LAB — COMPREHENSIVE METABOLIC PANEL
ALBUMIN: 4.2 g/dL (ref 3.5–5.0)
ALT: 26 U/L (ref 17–63)
ANION GAP: 6 (ref 5–15)
AST: 45 U/L — AB (ref 15–41)
Alkaline Phosphatase: 86 U/L (ref 38–126)
BILIRUBIN TOTAL: 4.4 mg/dL — AB (ref 0.3–1.2)
BUN: 10 mg/dL (ref 6–20)
CHLORIDE: 110 mmol/L (ref 101–111)
CO2: 22 mmol/L (ref 22–32)
Calcium: 8.7 mg/dL — ABNORMAL LOW (ref 8.9–10.3)
Creatinine, Ser: 0.76 mg/dL (ref 0.61–1.24)
GFR calc Af Amer: >60 mL/min (ref >60)
GFR calc non Af Amer: >60 mL/min (ref >60)
GLUCOSE: 110 mg/dL — AB (ref 65–99)
POTASSIUM: 4.4 mmol/L (ref 3.5–5.1)
Sodium: 138 mmol/L (ref 135–145)
TOTAL PROTEIN: 7.3 g/dL (ref 6.5–8.1)

## 2016-01-22 LAB — RETICULOCYTES
RBC.: 2.58 MIL/uL — ABNORMAL LOW (ref 4.22–5.81)
RETIC COUNT ABSOLUTE: 332.8 10*3/uL — AB (ref 19.0–186.0)
Retic Ct Pct: 12.9 % — ABNORMAL HIGH (ref 0.4–3.1)

## 2016-01-22 MED ORDER — DIPHENHYDRAMINE HCL 25 MG PO CAPS
25.0000 mg | ORAL_CAPSULE | ORAL | Status: DC | PRN
  Administered 2016-01-22: 25 mg via ORAL
  Filled 2016-01-22: qty 1

## 2016-01-22 MED ORDER — HYDROMORPHONE HCL 2 MG/ML IJ SOLN
2.0000 mg | INTRAMUSCULAR | Status: AC
Start: 2016-01-22 — End: 2016-01-22

## 2016-01-22 MED ORDER — HYDROMORPHONE HCL 2 MG/ML IJ SOLN
2.0000 mg | INTRAMUSCULAR | Status: AC
  Administered 2016-01-22: 2 mg via INTRAVENOUS
  Filled 2016-01-22: qty 1

## 2016-01-22 MED ORDER — HYDROMORPHONE HCL 2 MG/ML IJ SOLN
2.0000 mg | INTRAMUSCULAR | Status: AC
Start: 2016-01-22 — End: 2016-01-22
  Administered 2016-01-22: 2 mg via INTRAVENOUS
  Filled 2016-01-22: qty 1

## 2016-01-22 MED ORDER — HYDROMORPHONE HCL 2 MG/ML IJ SOLN
2.0000 mg | INTRAMUSCULAR | Status: DC
Start: 2016-01-22 — End: 2016-01-22
  Administered 2016-01-22: 2 mg via INTRAVENOUS
  Filled 2016-01-22: qty 1

## 2016-01-22 MED ORDER — HYDROMORPHONE HCL 2 MG/ML IJ SOLN: 2.0000 mg | INTRAMUSCULAR | Status: AC

## 2016-01-22 MED ORDER — HYDROMORPHONE HCL 2 MG/ML IJ SOLN: 2.0000 mg | INTRAMUSCULAR | Status: DC

## 2016-01-22 MED ORDER — HEPARIN SOD (PORK) LOCK FLUSH 100 UNIT/ML IV SOLN
500.0000 [IU] | Freq: Once | INTRAVENOUS | Status: AC
  Administered 2016-01-22: 500 [IU]
  Filled 2016-01-22: qty 5

## 2016-01-22 MED ORDER — SODIUM CHLORIDE 0.45 % IV SOLN
INTRAVENOUS | Status: DC
  Administered 2016-01-22: 12:00:00 via INTRAVENOUS

## 2016-01-22 MED ORDER — ONDANSETRON HCL 4 MG/2ML IJ SOLN: 4.0000 mg | INTRAMUSCULAR | Status: DC | PRN

## 2016-01-22 MED ORDER — KETOROLAC TROMETHAMINE 15 MG/ML IJ SOLN
15.0000 mg | INTRAMUSCULAR | Status: AC
  Administered 2016-01-22: 15 mg via INTRAVENOUS
  Filled 2016-01-22: qty 1

## 2016-01-22 NOTE — ED Provider Notes (Signed)
[facility] DEPT Provider Note   CSN: [id number] Arrival date & time: 01/22/16  1014     History   Chief Complaint Chief Complaint  Patient presents with  . Sickle Cell Pain Crisis    HPI [name] is a 35 y.o. male.  The history is provided by the patient.  Sickle Cell Pain Crisis  Pain location: lower back pain, bilaterally and both ankles. Severity:  Severe Onset quality:  Gradual Duration:  1 day Timing:  Constant Progression:  Worsening Chronicity:  Recurrent Frequency of attacks:  Frequent Context: cold exposure   Context: not alcohol consumption, not change in medication and not infection   Relieved by:  Nothing Worsened by:  Nothing Ineffective treatments:  Prescription drugs Associated symptoms: no chest pain, no congestion, no cough, no fatigue, no fever, no nausea and no vomiting     Past Medical History:  Diagnosis Date  . Acute chest syndrome ([place]) 06/18/2013  . Acute embolism and thrombosis of right internal jugular vein ([place])   . Alcohol consumption of one to four drinks per day   . Avascular necrosis (HCC)    Right Hip  . Blood transfusion   . Chronic anticoagulation   . Demand ischemia ([place]) 01/02/2014  . Former smoker   . Functional asplenia   . Hb-SS disease with crisis ([place])   . History of Clostridium difficile infection   . History of pulmonary embolus (PE)   . Hypertension   . Hypokalemia   . Leukocytosis    Chronic  . Mood disorder ([place])   . Noncompliance with medication regimen   . Oxygen deficiency   . Pulmonary hypertension   . Second hand tobacco smoke exposure   . Sickle cell anemia (HCC)   . Sickle-cell crisis with associated acute chest syndrome ([place]) 05/13/2013  . Stroke ([place])   . Thrombocytosis (HCC)    Chronic  . Uses marijuana     Patient Active Problem List   Diagnosis Date Noted  . Flank pain 01/16/2016  . Abdominal pain   . Colitis 12/15/2015  . Slurred speech 11/10/2015  . Pulmonary emboli ([place])  11/10/2015  . Aphasia   . Acute chest syndrome ([place])   . Ischemic stroke ([place])   . Sickle cell crisis ([place]) 10/27/2015  . Sickle cell pain crisis ([place]) 09/18/2015  . Sickle cell anemia with crisis ([place]) 09/04/2015  . Shortness of breath   . Pulmonary embolism without acute cor pulmonale ([place])   . Fever   . Sickle cell anemia with pain ([place]) 05/07/2015  . Acute kidney injury ([place])   . Hypoxia   . Anemia 01/31/2015  . Chronic anemia   . Hb-SS disease without crisis ([place]) 10/07/2014  . Acute on chronic respiratory failure with hypoxia ([place])   . PVC's (premature ventricular contractions) 09/10/2014  . Abnormal EKG 09/10/2014  . Chronic atrial fibrillation ([place]) 09/09/2014  . Cor pulmonale, chronic ([place]) 08/25/2014  . Sickle cell anemia ([place]) 06/25/2014  . Anemia of chronic disease 06/25/2014  . PAH (pulmonary artery hypertension) 03/18/2014  . Hb-SS disease with crisis ([place]) 01/22/2014  . Pulmonary embolus ([place]) 01/01/2014  . Paralytic strabismus, external ophthalmoplegia   . Chronic pain syndrome 12/12/2013  . Elevated troponin 11/26/2013  . Chronic anticoagulation 08/22/2013  . Essential hypertension 08/22/2013  . Pulmonary HTN 06/18/2013  . Functional asplenia   . Vitamin D deficiency 02/13/2013  . Hx of pulmonary embolus 06/29/2012  . Hemochromatosis 12/14/2011  . Avascular necrosis ([place])  Past Surgical History:  Procedure Laterality Date  . CHOLECYSTECTOMY     01/2008  . Excision of left periauricular cyst     10/2009  . Excision of right ear lobe cyst with primary closur     11/2007  . Porta cath placement    . Porta cath removal    . PORTACATH PLACEMENT  01/05/2012   Procedure: INSERTION PORT-A-CATH;  Surgeon: Odis Hollingshead, MD;  Location: [facility];  Service: General;  Laterality: N/A;  ultrasound guiced port a cath insertion with fluoroscopy  . Right hip replacement     08/2006  . UMBILICAL HERNIA REPAIR     01/2008       Home Medications    Prior to  Admission medications   Medication Sig Start Date End Date Taking? Authorizing Provider  ambrisentan (LETAIRIS) 5 MG tablet Take 5 mg by mouth at bedtime.   Yes Historical Provider, MD  aspirin 81 MG chewable tablet Chew 1 tablet (81 mg total) by mouth daily. 07/23/14  Yes [name], MD  cholecalciferol (VITAMIN D) 1000 units tablet Take 2,000 Units by mouth daily.   Yes Historical Provider, MD  Deferasirox (JADENU) 360 MG TABS Take 1,080 mg by mouth daily.    Yes Historical Provider, MD  folic acid (FOLVITE) 1 MG tablet Take 1 mg by mouth daily.   Yes Historical Provider, MD  gabapentin (NEURONTIN) 300 MG capsule Take 1 capsule (300 mg total) by mouth 3 (three) times daily. 06/01/15  Yes Tresa Garter, MD  hydroxyurea (HYDREA) 500 MG capsule Take 2 capsules (1,000 mg total) by mouth daily. May take with food to minimize GI side effects. Hold Hydrea until evaluated by Primary Provider or Hematologist. 12/22/15  Yes [name], MD  lisinopril (PRINIVIL,ZESTRIL) 5 MG tablet Take 1 tablet (5 mg total) by mouth daily. 08/04/15  Yes [name], MD  morphine (MS CONTIN) 30 MG 12 hr tablet Take 1 tablet (30 mg total) by mouth every 12 (twelve) hours. 12/29/15 01/28/16 Yes Olugbemiga Essie Christine, MD  potassium chloride SA (K-DUR,KLOR-CON) 20 MEQ tablet Take 2 tablets (40 mEq total) by mouth daily. 12/23/15  Yes [name], MD  rivaroxaban (XARELTO) 20 MG TABS tablet Take 1 tablet (20 mg total) by mouth at bedtime. 12/29/15  Yes Tresa Garter, MD  torsemide (DEMADEX) 20 MG tablet Take 20 mg by mouth daily as needed (for weight gain greater than 3lbs.).    Yes Historical Provider, MD  zolpidem (AMBIEN) 10 MG tablet Take 1 tablet (10 mg total) by mouth at bedtime as needed for sleep. 12/29/15 01/28/16 Yes Tresa Garter, MD    Family History Family History  Problem Relation Age of Onset  . Sickle cell trait Mother   . Depression Mother   . Diabetes Mother   .  Sickle cell trait Father   . Sickle cell trait Brother     Social History Social History  Substance Use Topics  . Smoking status: Former Smoker    Packs/day: 0.50    Years: 10.00    Types: Cigarettes    Quit date: 05/29/2011  . Smokeless tobacco: Never Used  . Alcohol use No     Allergies   Patient has no known allergies.   Review of Systems Review of Systems  Constitutional: Negative for fatigue and fever.  HENT: Negative for congestion.   Respiratory: Negative for cough.   Cardiovascular: Negative for chest pain.  Gastrointestinal: Negative for nausea and  vomiting.  All other systems reviewed and are negative.    Physical Exam Updated Vital Signs BP 108/77 (BP Location: Left Arm)   Pulse 87   Temp 98.4 F (36.9 C) (Oral)   Resp 20   Ht 6' (1.829 m)   Wt 81.6 kg   SpO2 91%   BMI 24.41 kg/m   Physical Exam  Constitutional: He appears well-developed and well-nourished. No distress.  HENT:  Head: Normocephalic and atraumatic.  Right Ear: External ear normal.  Left Ear: External ear normal.  Eyes: Conjunctivae are normal. Right eye exhibits no discharge. Left eye exhibits no discharge. No scleral icterus.  Neck: Neck supple. No tracheal deviation present.  Cardiovascular: Normal rate, regular rhythm and intact distal pulses.   Pulmonary/Chest: Effort normal and breath sounds normal. No stridor. No respiratory distress. He has no wheezes. He has no rales.  Abdominal: Soft. Bowel sounds are normal. He exhibits no distension. There is no tenderness. There is no rebound and no guarding.  Musculoskeletal: He exhibits no edema or tenderness.  Neurological: He is alert. He has normal strength. No cranial nerve deficit (no facial droop, extraocular movements intact, no slurred speech) or sensory deficit. He exhibits normal muscle tone. He displays no seizure activity. Coordination normal.  Skin: Skin is warm and dry. No rash noted.  Psychiatric: He has a normal mood and  affect.  Nursing note and vitals reviewed.    ED Treatments / Results  Labs (all labs ordered are listed, but only abnormal results are displayed) Labs Reviewed  COMPREHENSIVE METABOLIC PANEL - Abnormal; Notable for the following:       Result Value   Glucose, Bld 110 (*)    Calcium 8.7 (*)    AST 45 (*)    Total Bilirubin 4.4 (*)    All other components within normal limits  CBC WITH DIFFERENTIAL/PLATELET - Abnormal; Notable for the following:    WBC 12.0 (*)    RBC 2.58 (*)    Hemoglobin 7.8 (*)    HCT 23.3 (*)    RDW 19.8 (*)    Neutro Abs 8.9 (*)    Monocytes Absolute 1.4 (*)    All other components within normal limits  RETICULOCYTES - Abnormal; Notable for the following:    Retic Ct Pct 12.9 (*)    RBC. 2.58 (*)    Retic Count, Manual 332.8 (*)    All other components within normal limits    Procedures Procedures (including critical care time)  Medications Ordered in ED Medications  0.45 % sodium chloride infusion ( Intravenous New Bag/Given 01/22/16 1155)  HYDROmorphone (DILAUDID) injection 2 mg (2 mg Intravenous Given 01/22/16 1427)    Or  HYDROmorphone (DILAUDID) injection 2 mg ( Subcutaneous See Alternative 01/22/16 1427)  diphenhydrAMINE (BENADRYL) capsule 25-50 mg (25 mg Oral Given 01/22/16 1155)  ondansetron (ZOFRAN) injection 4 mg (not administered)  ketorolac (TORADOL) 15 MG/ML injection 15 mg (15 mg Intravenous Given 01/22/16 1155)  HYDROmorphone (DILAUDID) injection 2 mg (2 mg Intravenous Given 01/22/16 1155)    Or  HYDROmorphone (DILAUDID) injection 2 mg ( Subcutaneous See Alternative 01/22/16 1155)  HYDROmorphone (DILAUDID) injection 2 mg (2 mg Intravenous Given 01/22/16 1250)    Or  HYDROmorphone (DILAUDID) injection 2 mg ( Subcutaneous See Alternative 01/22/16 1250)  HYDROmorphone (DILAUDID) injection 2 mg (2 mg Intravenous Given 01/22/16 1346)    Or  HYDROmorphone (DILAUDID) injection 2 mg ( Subcutaneous See Alternative 01/22/16 1346)      Initial Impression / Assessment  and Plan / ED Course  I have reviewed the triage vital signs and the nursing notes.  Pertinent labs & imaging results that were available during my care of the patient were reviewed by me and considered in my medical decision making (see chart for details).  Clinical Course as of Jan 22 1504  Fri Jan 22, 2016  1452 Labs reviewed.   Stable anemia.  Elevated retic consistent with sickle cell pain crisis  [JK]    Clinical Course User Index [JK] Dorie Rank, MD   Sx consistent with sickle cell crisis.  No signs of acute infection, PE, chest syndrome. Pt was treated in the ED with the medications above.  Sx improved.  At 1511 pt is feeling much better.   Feels ready for discharge.  Final Clinical Impressions(s) / ED Diagnoses   Final diagnoses:  Sickle cell pain crisis [facility])    New Prescriptions New Prescriptions   No medications on file     Dorie Rank, MD 01/22/16 1511

## 2016-01-22 NOTE — ED Notes (Signed)
Pt provided with meal tray.

## 2016-01-22 NOTE — ED Triage Notes (Signed)
Pt reports sickle cell crisis pain in ribs and knee. Typical location for crisis pain. No emesis or fever.

## 2016-01-22 NOTE — Discharge Instructions (Signed)
Continue your current medications. Follow-up at the sickle cell clinic. Return as needed for worsening symptoms.

## 2016-01-24 ENCOUNTER — Emergency Department (HOSPITAL_COMMUNITY)
Admission: EM | Admit: 2016-01-24 | Discharge: 2016-01-24 | Disposition: A | Discharge status: Home / Self Care | Payer: Medicare Other | Attending: Emergency Medicine | Admitting: Emergency Medicine

## 2016-01-24 ENCOUNTER — Encounter (HOSPITAL_COMMUNITY): Payer: Self-pay | Admitting: Emergency Medicine

## 2016-01-24 DIAGNOSIS — I10 Essential (primary) hypertension: Secondary | ICD-10-CM | POA: Diagnosis not present

## 2016-01-24 DIAGNOSIS — Z8673 Personal history of transient ischemic attack (TIA), and cerebral infarction without residual deficits: Secondary | ICD-10-CM | POA: Diagnosis not present

## 2016-01-24 DIAGNOSIS — Z96641 Presence of right artificial hip joint: Secondary | ICD-10-CM | POA: Diagnosis not present

## 2016-01-24 DIAGNOSIS — Z79899 Other long term (current) drug therapy: Secondary | ICD-10-CM | POA: Diagnosis not present

## 2016-01-24 DIAGNOSIS — Z87891 Personal history of nicotine dependence: Secondary | ICD-10-CM | POA: Insufficient documentation

## 2016-01-24 DIAGNOSIS — D5700 Hb-SS disease with crisis, unspecified: Secondary | ICD-10-CM

## 2016-01-24 DIAGNOSIS — Z7982 Long term (current) use of aspirin: Secondary | ICD-10-CM | POA: Diagnosis not present

## 2016-01-24 LAB — COMPREHENSIVE METABOLIC PANEL
ALBUMIN: 4.3 g/dL (ref 3.5–5.0)
ALT: 28 U/L (ref 17–63)
AST: 45 U/L — AB (ref 15–41)
Alkaline Phosphatase: 87 U/L (ref 38–126)
Anion gap: 7 (ref 5–15)
BILIRUBIN TOTAL: 4.6 mg/dL — AB (ref 0.3–1.2)
BUN: 11 mg/dL (ref 6–20)
CHLORIDE: 106 mmol/L (ref 101–111)
CO2: 22 mmol/L (ref 22–32)
Calcium: 8.9 mg/dL (ref 8.9–10.3)
Creatinine, Ser: 0.75 mg/dL (ref 0.61–1.24)
GFR calc Af Amer: >60 mL/min (ref >60)
GFR calc non Af Amer: >60 mL/min (ref >60)
GLUCOSE: 106 mg/dL — AB (ref 65–99)
POTASSIUM: 4 mmol/L (ref 3.5–5.1)
SODIUM: 135 mmol/L (ref 135–145)
Total Protein: 7.1 g/dL (ref 6.5–8.1)

## 2016-01-24 LAB — CBC WITH DIFFERENTIAL/PLATELET
BASOS PCT: 1 %
Basophils Absolute: 0.1 10*3/uL (ref 0.0–0.1)
EOS PCT: 2 %
Eosinophils Absolute: 0.3 10*3/uL (ref 0.0–0.7)
HEMATOCRIT: 24.6 % — AB (ref 39.0–52.0)
HEMOGLOBIN: 8.4 g/dL — AB (ref 13.0–17.0)
LYMPHS ABS: 1.9 10*3/uL (ref 0.7–4.0)
Lymphocytes Relative: 14 %
MCH: 30.5 pg (ref 26.0–34.0)
MCHC: 34.1 g/dL (ref 30.0–36.0)
MCV: 89.5 fL (ref 78.0–100.0)
Monocytes Absolute: 1.8 10*3/uL — ABNORMAL HIGH (ref 0.1–1.0)
Monocytes Relative: 13 %
NEUTROS ABS: 9.8 10*3/uL — AB (ref 1.7–7.7)
Neutrophils Relative %: 70 %
Platelets: 388 10*3/uL (ref 150–400)
RBC: 2.75 MIL/uL — ABNORMAL LOW (ref 4.22–5.81)
RDW: 19.3 % — ABNORMAL HIGH (ref 11.5–15.5)
WBC: 13.9 10*3/uL — ABNORMAL HIGH (ref 4.0–10.5)

## 2016-01-24 LAB — RETICULOCYTES
RBC.: 2.75 MIL/uL — ABNORMAL LOW (ref 4.22–5.81)
Retic Count, Absolute: 297 10*3/uL — ABNORMAL HIGH (ref 19.0–186.0)
Retic Ct Pct: 10.8 % — ABNORMAL HIGH (ref 0.4–3.1)

## 2016-01-24 MED ORDER — HEPARIN SOD (PORK) LOCK FLUSH 100 UNIT/ML IV SOLN
INTRAVENOUS | Status: AC
  Administered 2016-01-24: 500 [IU]
  Filled 2016-01-24: qty 5

## 2016-01-24 MED ORDER — HYDROMORPHONE HCL 2 MG/ML IJ SOLN
2.0000 mg | INTRAMUSCULAR | Status: AC
  Administered 2016-01-24: 2 mg via INTRAVENOUS
  Filled 2016-01-24: qty 1

## 2016-01-24 MED ORDER — HYDROMORPHONE HCL 2 MG/ML IJ SOLN: 2.0000 mg | INTRAMUSCULAR | Status: AC

## 2016-01-24 MED ORDER — HYDROMORPHONE HCL 2 MG/ML IJ SOLN: 2.0000 mg | INTRAMUSCULAR | Status: DC

## 2016-01-24 MED ORDER — DIPHENHYDRAMINE HCL 25 MG PO CAPS
25.0000 mg | ORAL_CAPSULE | ORAL | Status: DC | PRN
  Administered 2016-01-24: 25 mg via ORAL
  Filled 2016-01-24: qty 1

## 2016-01-24 MED ORDER — ONDANSETRON HCL 4 MG/2ML IJ SOLN
4.0000 mg | INTRAMUSCULAR | Status: DC | PRN
  Filled 2016-01-24: qty 2

## 2016-01-24 MED ORDER — SODIUM CHLORIDE 0.45 % IV SOLN
INTRAVENOUS | Status: DC
  Administered 2016-01-24: 05:00:00 via INTRAVENOUS

## 2016-01-24 MED ORDER — HYDROMORPHONE HCL 2 MG/ML IJ SOLN
2.0000 mg | INTRAMUSCULAR | Status: AC
Start: 2016-01-24 — End: 2016-01-24

## 2016-01-24 MED ORDER — KETOROLAC TROMETHAMINE 15 MG/ML IJ SOLN
15.0000 mg | INTRAMUSCULAR | Status: AC
  Administered 2016-01-24: 15 mg via INTRAVENOUS
  Filled 2016-01-24: qty 1

## 2016-01-24 NOTE — ED Notes (Signed)
Bed: WA02 Expected date:  Expected time:  Means of arrival:  Comments: 

## 2016-01-24 NOTE — Discharge Instructions (Signed)
Please read attached information. If you experience any new or worsening signs or symptoms please return to the emergency room for evaluation. Please follow-up with your primary care provider or specialist as discussed.  °

## 2016-01-24 NOTE — ED Notes (Signed)
Pt stated he would like labs drawn when his port is accessed

## 2016-01-24 NOTE — ED Provider Notes (Signed)
[facility] DEPT Provider Note   CSN: [id number] Arrival date & time: 01/24/16  0138     History   Chief Complaint Chief Complaint  Patient presents with  . Sickle Cell Pain Crisis    HPI [name] is a 35 y.o. male.  HPI   35 year old male presents today with complaints of sickle cell pain crisis. Patient reports today's presentation is identical to previous. He reports left-sided pain and bilateral ankle pain. Patient denies any chest pain or shortness of breath, fever or chills. He denies any swelling to the extremities. He reports the cold weather is likely making his symptoms worse. No nausea vomiting, abdominal pain. Patient reports taking home medications without relief symptoms.  Past Medical History:  Diagnosis Date  . Acute chest syndrome ([place]) 06/18/2013  . Acute embolism and thrombosis of right internal jugular vein ([place])   . Alcohol consumption of one to four drinks per day   . Avascular necrosis (HCC)    Right Hip  . Blood transfusion   . Chronic anticoagulation   . Demand ischemia ([place]) 01/02/2014  . Former smoker   . Functional asplenia   . Hb-SS disease with crisis ([place])   . History of Clostridium difficile infection   . History of pulmonary embolus (PE)   . Hypertension   . Hypokalemia   . Leukocytosis    Chronic  . Mood disorder ([place])   . Noncompliance with medication regimen   . Oxygen deficiency   . Pulmonary hypertension   . Second hand tobacco smoke exposure   . Sickle cell anemia (HCC)   . Sickle-cell crisis with associated acute chest syndrome ([place]) 05/13/2013  . Stroke ([place])   . Thrombocytosis (HCC)    Chronic  . Uses marijuana     Patient Active Problem List   Diagnosis Date Noted  . Flank pain 01/16/2016  . Abdominal pain   . Colitis 12/15/2015  . Slurred speech 11/10/2015  . Pulmonary emboli ([place]) 11/10/2015  . Aphasia   . Acute chest syndrome ([place])   . Ischemic stroke ([place])   . Sickle cell crisis ([place]) 10/27/2015    . Sickle cell pain crisis ([place]) 09/18/2015  . Sickle cell anemia with crisis ([place]) 09/04/2015  . Shortness of breath   . Pulmonary embolism without acute cor pulmonale ([place])   . Fever   . Sickle cell anemia with pain ([place]) 05/07/2015  . Acute kidney injury ([place])   . Hypoxia   . Anemia 01/31/2015  . Chronic anemia   . Hb-SS disease without crisis ([place]) 10/07/2014  . Acute on chronic respiratory failure with hypoxia ([place])   . PVC's (premature ventricular contractions) 09/10/2014  . Abnormal EKG 09/10/2014  . Chronic atrial fibrillation ([place]) 09/09/2014  . Cor pulmonale, chronic ([place]) 08/25/2014  . Sickle cell anemia ([place]) 06/25/2014  . Anemia of chronic disease 06/25/2014  . PAH (pulmonary artery hypertension) 03/18/2014  . Hb-SS disease with crisis ([place]) 01/22/2014  . Pulmonary embolus ([place]) 01/01/2014  . Paralytic strabismus, external ophthalmoplegia   . Chronic pain syndrome 12/12/2013  . Elevated troponin 11/26/2013  . Chronic anticoagulation 08/22/2013  . Essential hypertension 08/22/2013  . Pulmonary HTN 06/18/2013  . Functional asplenia   . Vitamin D deficiency 02/13/2013  . Hx of pulmonary embolus 06/29/2012  . Hemochromatosis 12/14/2011  . Avascular necrosis [facility])     Past Surgical History:  Procedure Laterality Date  . CHOLECYSTECTOMY     01/2008  . Excision of left periauricular cyst  10/2009  . Excision of right ear lobe cyst with primary closur     11/2007  . Porta cath placement    . Porta cath removal    . PORTACATH PLACEMENT  01/05/2012   Procedure: INSERTION PORT-A-CATH;  Surgeon: Odis Hollingshead, MD;  Location: [facility];  Service: General;  Laterality: N/A;  ultrasound guiced port a cath insertion with fluoroscopy  . Right hip replacement     08/2006  . UMBILICAL HERNIA REPAIR     01/2008       Home Medications    Prior to Admission medications   Medication Sig Start Date End Date Taking? Authorizing Provider  ambrisentan (LETAIRIS) 5 MG  tablet Take 5 mg by mouth at bedtime.   Yes Historical Provider, MD  aspirin 81 MG chewable tablet Chew 1 tablet (81 mg total) by mouth daily. 07/23/14  Yes [name], MD  cholecalciferol (VITAMIN D) 1000 units tablet Take 2,000 Units by mouth daily.   Yes Historical Provider, MD  Deferasirox (JADENU) 360 MG TABS Take 1,080 mg by mouth daily.    Yes Historical Provider, MD  folic acid (FOLVITE) 1 MG tablet Take 1 mg by mouth daily.   Yes Historical Provider, MD  gabapentin (NEURONTIN) 300 MG capsule Take 1 capsule (300 mg total) by mouth 3 (three) times daily. 06/01/15  Yes Tresa Garter, MD  hydroxyurea (HYDREA) 500 MG capsule Take 2 capsules (1,000 mg total) by mouth daily. May take with food to minimize GI side effects. Hold Hydrea until evaluated by Primary Provider or Hematologist. 12/22/15  Yes [name], MD  lisinopril (PRINIVIL,ZESTRIL) 5 MG tablet Take 1 tablet (5 mg total) by mouth daily. 08/04/15  Yes [name], MD  morphine (MS CONTIN) 30 MG 12 hr tablet Take 1 tablet (30 mg total) by mouth every 12 (twelve) hours. 12/29/15 01/28/16 Yes Olugbemiga Essie Christine, MD  potassium chloride SA (K-DUR,KLOR-CON) 20 MEQ tablet Take 2 tablets (40 mEq total) by mouth daily. 12/23/15  Yes [name], MD  rivaroxaban (XARELTO) 20 MG TABS tablet Take 1 tablet (20 mg total) by mouth at bedtime. 12/29/15  Yes Tresa Garter, MD  torsemide (DEMADEX) 20 MG tablet Take 20 mg by mouth daily as needed (for weight gain greater than 3lbs.).    Yes Historical Provider, MD  zolpidem (AMBIEN) 10 MG tablet Take 1 tablet (10 mg total) by mouth at bedtime as needed for sleep. 12/29/15 01/28/16 Yes Tresa Garter, MD    Family History Family History  Problem Relation Age of Onset  . Sickle cell trait Mother   . Depression Mother   . Diabetes Mother   . Sickle cell trait Father   . Sickle cell trait Brother     Social History Social History  Substance Use Topics    . Smoking status: Former Smoker    Packs/day: 0.50    Years: 10.00    Types: Cigarettes    Quit date: 05/29/2011  . Smokeless tobacco: Never Used  . Alcohol use No     Allergies   Patient has no known allergies.   Review of Systems Review of Systems  All other systems reviewed and are negative.   Physical Exam Updated Vital Signs BP 104/88   Pulse 75   Temp 98.6 F (37 C) (Oral)   Resp 21   Ht 6' (1.829 m)   Wt 81.6 kg   SpO2 93%   BMI 24.41 kg/m   Physical  Exam  Constitutional: He is oriented to person, place, and time. He appears well-developed and well-nourished.  HENT:  Head: Normocephalic and atraumatic.  Eyes: Conjunctivae and EOM are normal. Pupils are equal, round, and reactive to light. Right eye exhibits no discharge. Left eye exhibits no discharge. No scleral icterus.  Neck: Normal range of motion. No JVD present. No tracheal deviation present.  Full active pain free range of motion of the neck; supple  Cardiovascular: Normal rate, regular rhythm, normal heart sounds and intact distal pulses.  Exam reveals no gallop and no friction rub.   No murmur heard. Pulmonary/Chest: Effort normal and breath sounds normal. No stridor. No respiratory distress. He has no wheezes. He has no rales. He exhibits no tenderness.  Nontender to palpation  Abdominal: Soft. He exhibits no distension and no mass. There is no tenderness. There is no rebound and no guarding.  Musculoskeletal: Normal range of motion.  No obvious swelling, warmth touch, signs of infection to the upper lower extremities chest back or abdomen. Muscular compartments are soft, joints are supple with no swelling, warmth to touch, or decreased range of motion  Lymphadenopathy:    He has no cervical adenopathy.  Neurological: He is alert and oriented to person, place, and time. Coordination normal.  Skin: Skin is warm and dry. No rash noted. He is not diaphoretic. No erythema. No pallor.  Psychiatric: He  has a normal mood and affect. His behavior is normal. Judgment and thought content normal.  Nursing note and vitals reviewed.   ED Treatments / Results  Labs (all labs ordered are listed, but only abnormal results are displayed) Labs Reviewed  COMPREHENSIVE METABOLIC PANEL - Abnormal; Notable for the following:       Result Value   Glucose, Bld 106 (*)    AST 45 (*)    Total Bilirubin 4.6 (*)    All other components within normal limits  CBC WITH DIFFERENTIAL/PLATELET - Abnormal; Notable for the following:    WBC 13.9 (*)    RBC 2.75 (*)    Hemoglobin 8.4 (*)    HCT 24.6 (*)    RDW 19.3 (*)    Neutro Abs 9.8 (*)    Monocytes Absolute 1.8 (*)    All other components within normal limits  RETICULOCYTES - Abnormal; Notable for the following:    Retic Ct Pct 10.8 (*)    RBC. 2.75 (*)    Retic Count, Manual 297.0 (*)    All other components within normal limits    EKG  EKG Interpretation None       Radiology No results found.  Procedures Procedures (including critical care time)  Medications Ordered in ED Medications  0.45 % sodium chloride infusion ( Intravenous New Bag/Given 01/24/16 0504)  diphenhydrAMINE (BENADRYL) capsule 25-50 mg (25 mg Oral Given 01/24/16 0503)  ondansetron (ZOFRAN) injection 4 mg (not administered)  ketorolac (TORADOL) 15 MG/ML injection 15 mg (15 mg Intravenous Given 01/24/16 0504)  HYDROmorphone (DILAUDID) injection 2 mg (2 mg Intravenous Given 01/24/16 0515)    Or  HYDROmorphone (DILAUDID) injection 2 mg ( Subcutaneous See Alternative 01/24/16 0515)  HYDROmorphone (DILAUDID) injection 2 mg (2 mg Intravenous Given 01/24/16 0603)    Or  HYDROmorphone (DILAUDID) injection 2 mg ( Subcutaneous See Alternative 01/24/16 0603)  HYDROmorphone (DILAUDID) injection 2 mg (2 mg Intravenous Given 01/24/16 0634)    Or  HYDROmorphone (DILAUDID) injection 2 mg ( Subcutaneous See Alternative 01/24/16 0634)  heparin lock flush 100 UNIT/ML injection (500  Units  Given 01/24/16 [id number])     Initial Impression / Assessment and Plan / ED Course  I have reviewed the triage vital signs and the nursing notes.  Pertinent labs & imaging results that were available during my care of the patient were reviewed by me and considered in my medical decision making (see chart for details).  Clinical Course      Final Clinical Impressions(s) / ED Diagnoses   Final diagnoses:  Sickle cell pain crisis ([place])    Labs:  Imaging:  Consults:  Therapeutics: Dilaudid, Toradol, Benadryl  Discharge Meds:   Assessment/Plan:    Pt with a history of sickle cell disease presents with likely vasoocclusive pain episode. Today's presentation typical of previous episodes and managed with above hospital administered medications with good symptomatic improvement. Pt has no signs or symptoms that would indicate acutely life threatening or disabling conditions such as infection, severe anemia, stroke, acute chest syndrome, renal infarction, bone infection, dactylitis, MI, priapism, organ failure, or venous thromboembolism.  Patient reports improvement in symptoms and will be discharged home with strict return depressions.    New Prescriptions New Prescriptions   No medications on file     Okey Regal, PA-C 01/24/16 [id number]    Quintella Reichert, MD 01/27/16 [phone]

## 2016-01-24 NOTE — ED Triage Notes (Signed)
Pt from home with complaints of SSC. Pt presents with pain in bilateral ankles. Pt denies CP, or SOB. Pt states pain feels like his typical sickle cell pain. Pt is ambulatory. Pt denies fever or emesis

## 2016-01-25 DIAGNOSIS — D5700 Hb-SS disease with crisis, unspecified: Secondary | ICD-10-CM | POA: Diagnosis not present

## 2016-01-25 DIAGNOSIS — R05 Cough: Secondary | ICD-10-CM | POA: Diagnosis not present

## 2016-01-25 DIAGNOSIS — D649 Anemia, unspecified: Secondary | ICD-10-CM | POA: Diagnosis not present

## 2016-01-25 DIAGNOSIS — G894 Chronic pain syndrome: Secondary | ICD-10-CM | POA: Diagnosis not present

## 2016-01-25 DIAGNOSIS — Z8673 Personal history of transient ischemic attack (TIA), and cerebral infarction without residual deficits: Secondary | ICD-10-CM | POA: Diagnosis not present

## 2016-01-25 DIAGNOSIS — M25571 Pain in right ankle and joints of right foot: Secondary | ICD-10-CM | POA: Diagnosis not present

## 2016-01-25 DIAGNOSIS — D72829 Elevated white blood cell count, unspecified: Secondary | ICD-10-CM | POA: Diagnosis not present

## 2016-01-25 DIAGNOSIS — R17 Unspecified jaundice: Secondary | ICD-10-CM | POA: Diagnosis not present

## 2016-01-25 DIAGNOSIS — I10 Essential (primary) hypertension: Secondary | ICD-10-CM | POA: Diagnosis present

## 2016-01-25 DIAGNOSIS — Z7982 Long term (current) use of aspirin: Secondary | ICD-10-CM | POA: Diagnosis not present

## 2016-01-25 DIAGNOSIS — M25572 Pain in left ankle and joints of left foot: Secondary | ICD-10-CM | POA: Diagnosis not present

## 2016-01-25 DIAGNOSIS — Z96641 Presence of right artificial hip joint: Secondary | ICD-10-CM | POA: Diagnosis present

## 2016-01-25 DIAGNOSIS — M791 Myalgia: Secondary | ICD-10-CM | POA: Diagnosis not present

## 2016-01-25 DIAGNOSIS — I2782 Chronic pulmonary embolism: Secondary | ICD-10-CM | POA: Diagnosis not present

## 2016-01-28 NOTE — Telephone Encounter (Signed)
See previous note

## 2016-01-29 ENCOUNTER — Encounter (HOSPITAL_COMMUNITY): Payer: Self-pay | Admitting: *Deleted

## 2016-01-29 ENCOUNTER — Telehealth (HOSPITAL_COMMUNITY): Payer: Self-pay | Admitting: *Deleted

## 2016-01-29 ENCOUNTER — Non-Acute Institutional Stay (HOSPITAL_BASED_OUTPATIENT_CLINIC_OR_DEPARTMENT_OTHER)
Admission: AD | Admit: 2016-01-29 | Discharge: 2016-01-29 | Disposition: A | Discharge status: Home / Self Care | Payer: Medicare Other | Source: Ambulatory Visit | Attending: Internal Medicine | Admitting: Internal Medicine

## 2016-01-29 DIAGNOSIS — Z832 Family history of diseases of the blood and blood-forming organs and certain disorders involving the immune mechanism: Secondary | ICD-10-CM | POA: Diagnosis not present

## 2016-01-29 DIAGNOSIS — G894 Chronic pain syndrome: Secondary | ICD-10-CM | POA: Diagnosis present

## 2016-01-29 DIAGNOSIS — Z79899 Other long term (current) drug therapy: Secondary | ICD-10-CM

## 2016-01-29 DIAGNOSIS — Z7901 Long term (current) use of anticoagulants: Secondary | ICD-10-CM

## 2016-01-29 DIAGNOSIS — D638 Anemia in other chronic diseases classified elsewhere: Secondary | ICD-10-CM | POA: Diagnosis present

## 2016-01-29 DIAGNOSIS — Z87891 Personal history of nicotine dependence: Secondary | ICD-10-CM

## 2016-01-29 DIAGNOSIS — R0989 Other specified symptoms and signs involving the circulatory and respiratory systems: Secondary | ICD-10-CM | POA: Diagnosis not present

## 2016-01-29 DIAGNOSIS — I2781 Cor pulmonale (chronic): Secondary | ICD-10-CM | POA: Diagnosis not present

## 2016-01-29 DIAGNOSIS — Z79891 Long term (current) use of opiate analgesic: Secondary | ICD-10-CM | POA: Insufficient documentation

## 2016-01-29 DIAGNOSIS — I248 Other forms of acute ischemic heart disease: Secondary | ICD-10-CM | POA: Diagnosis not present

## 2016-01-29 DIAGNOSIS — Z9119 Patient's noncompliance with other medical treatment and regimen: Secondary | ICD-10-CM

## 2016-01-29 DIAGNOSIS — Q8901 Asplenia (congenital): Secondary | ICD-10-CM

## 2016-01-29 DIAGNOSIS — Z86711 Personal history of pulmonary embolism: Secondary | ICD-10-CM

## 2016-01-29 DIAGNOSIS — J9621 Acute and chronic respiratory failure with hypoxia: Secondary | ICD-10-CM | POA: Diagnosis not present

## 2016-01-29 DIAGNOSIS — Z7982 Long term (current) use of aspirin: Secondary | ICD-10-CM

## 2016-01-29 DIAGNOSIS — Z86718 Personal history of other venous thrombosis and embolism: Secondary | ICD-10-CM | POA: Insufficient documentation

## 2016-01-29 DIAGNOSIS — Z96641 Presence of right artificial hip joint: Secondary | ICD-10-CM | POA: Insufficient documentation

## 2016-01-29 DIAGNOSIS — Z818 Family history of other mental and behavioral disorders: Secondary | ICD-10-CM

## 2016-01-29 DIAGNOSIS — Z833 Family history of diabetes mellitus: Secondary | ICD-10-CM | POA: Diagnosis not present

## 2016-01-29 DIAGNOSIS — D5700 Hb-SS disease with crisis, unspecified: Secondary | ICD-10-CM | POA: Diagnosis present

## 2016-01-29 DIAGNOSIS — Z9114 Patient's other noncompliance with medication regimen: Secondary | ICD-10-CM

## 2016-01-29 DIAGNOSIS — I272 Pulmonary hypertension, unspecified: Secondary | ICD-10-CM | POA: Insufficient documentation

## 2016-01-29 DIAGNOSIS — I10 Essential (primary) hypertension: Secondary | ICD-10-CM

## 2016-01-29 DIAGNOSIS — Z8673 Personal history of transient ischemic attack (TIA), and cerebral infarction without residual deficits: Secondary | ICD-10-CM | POA: Insufficient documentation

## 2016-01-29 DIAGNOSIS — Z7722 Contact with and (suspected) exposure to environmental tobacco smoke (acute) (chronic): Secondary | ICD-10-CM | POA: Diagnosis not present

## 2016-01-29 DIAGNOSIS — I2729 Other secondary pulmonary hypertension: Secondary | ICD-10-CM | POA: Diagnosis present

## 2016-01-29 LAB — COMPREHENSIVE METABOLIC PANEL
ALT: 22 U/L (ref 17–63)
AST: 31 U/L (ref 15–41)
Albumin: 4.4 g/dL (ref 3.5–5.0)
Alkaline Phosphatase: 86 U/L (ref 38–126)
Anion gap: 6 (ref 5–15)
BUN: 9 mg/dL (ref 6–20)
CHLORIDE: 106 mmol/L (ref 101–111)
CO2: 23 mmol/L (ref 22–32)
CREATININE: 0.72 mg/dL (ref 0.61–1.24)
Calcium: 9 mg/dL (ref 8.9–10.3)
Glucose, Bld: 109 mg/dL — ABNORMAL HIGH (ref 65–99)
Potassium: 3.8 mmol/L (ref 3.5–5.1)
Sodium: 135 mmol/L (ref 135–145)
Total Bilirubin: 5.5 mg/dL — ABNORMAL HIGH (ref 0.3–1.2)
Total Protein: 7.6 g/dL (ref 6.5–8.1)

## 2016-01-29 LAB — CBC WITH DIFFERENTIAL/PLATELET
BASOS ABS: 0.1 10*3/uL (ref 0.0–0.1)
BASOS PCT: 0 %
EOS ABS: 0.2 10*3/uL (ref 0.0–0.7)
Eosinophils Relative: 2 %
HCT: 24.5 % — ABNORMAL LOW (ref 39.0–52.0)
HEMOGLOBIN: 8.5 g/dL — AB (ref 13.0–17.0)
LYMPHS ABS: 1 10*3/uL (ref 0.7–4.0)
Lymphocytes Relative: 8 %
MCH: 31 pg (ref 26.0–34.0)
MCHC: 34.7 g/dL (ref 30.0–36.0)
MCV: 89.4 fL (ref 78.0–100.0)
Monocytes Absolute: 1.2 10*3/uL — ABNORMAL HIGH (ref 0.1–1.0)
Monocytes Relative: 9 %
NEUTROS PCT: 81 %
Neutro Abs: 10.6 10*3/uL — ABNORMAL HIGH (ref 1.7–7.7)
Platelets: 564 10*3/uL — ABNORMAL HIGH (ref 150–400)
RBC: 2.74 MIL/uL — AB (ref 4.22–5.81)
RDW: 20.6 % — ABNORMAL HIGH (ref 11.5–15.5)
WBC: 13 10*3/uL — AB (ref 4.0–10.5)

## 2016-01-29 LAB — RETICULOCYTES
RBC.: 2.74 MIL/uL — AB (ref 4.22–5.81)
RETIC COUNT ABSOLUTE: 317.8 10*3/uL — AB (ref 19.0–186.0)
RETIC CT PCT: 11.6 % — AB (ref 0.4–3.1)

## 2016-01-29 MED ORDER — SODIUM CHLORIDE 0.9% FLUSH: 9.0000 mL | INTRAVENOUS | Status: DC | PRN

## 2016-01-29 MED ORDER — DIPHENHYDRAMINE HCL 25 MG PO CAPS: 25.0000 mg | ORAL_CAPSULE | ORAL | Status: DC | PRN

## 2016-01-29 MED ORDER — SODIUM CHLORIDE 0.9% FLUSH
10.0000 mL | INTRAVENOUS | Status: AC | PRN
  Administered 2016-01-29: 10 mL

## 2016-01-29 MED ORDER — SODIUM CHLORIDE 0.9 % IV SOLN: 25.0000 mg | INTRAVENOUS | Status: DC | PRN

## 2016-01-29 MED ORDER — HYDROMORPHONE 1 MG/ML IV SOLN
INTRAVENOUS | Status: DC
  Administered 2016-01-29: 9.1 mg via INTRAVENOUS
  Administered 2016-01-29: 10.5 mg via INTRAVENOUS
  Administered 2016-01-29: 10:00:00 via INTRAVENOUS
  Filled 2016-01-29: qty 25

## 2016-01-29 MED ORDER — HEPARIN SOD (PORK) LOCK FLUSH 100 UNIT/ML IV SOLN
500.0000 [IU] | INTRAVENOUS | Status: AC | PRN
  Administered 2016-01-29: 500 [IU]

## 2016-01-29 MED ORDER — ONDANSETRON HCL 4 MG/2ML IJ SOLN
4.0000 mg | Freq: Four times a day (QID) | INTRAMUSCULAR | Status: DC | PRN
Start: 2016-01-29 — End: 2016-01-29

## 2016-01-29 MED ORDER — SODIUM CHLORIDE 0.9% FLUSH: 10.0000 mL | INTRAVENOUS | Status: DC | PRN

## 2016-01-29 MED ORDER — NALOXONE HCL 0.4 MG/ML IJ SOLN
0.4000 mg | INTRAMUSCULAR | Status: DC | PRN
Start: 2016-01-29 — End: 2016-01-29

## 2016-01-29 MED ORDER — DEXTROSE-NACL 5-0.45 % IV SOLN
INTRAVENOUS | Status: DC
  Administered 2016-01-29: 10:00:00 via INTRAVENOUS

## 2016-01-29 MED ORDER — HEPARIN SOD (PORK) LOCK FLUSH 100 UNIT/ML IV SOLN
500.0000 [IU] | INTRAVENOUS | Status: DC | PRN
  Filled 2016-01-29: qty 5

## 2016-01-29 NOTE — Progress Notes (Signed)
Pt received to the [facility] for treatment. Pt states his pain was 8/10 and that it was in his sides and back. Pt was treated with IV fluids, and Dilaudid PCA. Pt's pain # was 5/10 at discharge. Discharge instructions given to patient with verbal understanding. Pt reminded of appointment on Feb 09, 2016. Pt voiced understanding. Alert, oriented and ambulatory at discharge. Stated his brother was picking him up to take him home.

## 2016-01-29 NOTE — Telephone Encounter (Signed)
Patient called requesting to come to the [facility] for treatment. Stated his pain was 8/10 and that it was in his sides and back. He stated he took his last pain med of Dilaudid and MS contin at 6am this morning. He denies fever, chest pain, nausea, vomiting, diarrhea, abd pain and priapism. Will check with the provider and give him a call back.Pt voiced understanding.

## 2016-01-29 NOTE — Discharge Summary (Signed)
[facility] Discharge Summary   Patient ID: KITT KERKHOFF MRN: [id number] DOB/AGE: December 07, 1980 35 y.o.  Admit date: 01/29/2016 Discharge date: 01/29/2016  Primary Care Physician:  [name], MD  Admission Diagnoses:  Active Problems:   Sickle cell anemia with pain [facility])  Discharge Medications:  Allergies as of 01/29/2016   No Known Allergies     Medication List    TAKE these medications   ambrisentan 5 MG tablet Commonly known as:  LETAIRIS Take 5 mg by mouth at bedtime.   aspirin 81 MG chewable tablet Chew 1 tablet (81 mg total) by mouth daily.   cholecalciferol 1000 units tablet Commonly known as:  VITAMIN D Take 2,000 Units by mouth daily.   folic acid 1 MG tablet Commonly known as:  FOLVITE Take 1 mg by mouth daily.   gabapentin 300 MG capsule Commonly known as:  NEURONTIN Take 1 capsule (300 mg total) by mouth 3 (three) times daily.   hydroxyurea 500 MG capsule Commonly known as:  HYDREA Take 2 capsules (1,000 mg total) by mouth daily. May take with food to minimize GI side effects. Hold Hydrea until evaluated by Primary Provider or Hematologist.   JADENU 360 MG Tabs Generic drug:  Deferasirox Take 1,080 mg by mouth daily.   lisinopril 5 MG tablet Commonly known as:  PRINIVIL,ZESTRIL Take 1 tablet (5 mg total) by mouth daily.   morphine 30 MG 12 hr tablet Commonly known as:  MS CONTIN Take 1 tablet (30 mg total) by mouth every 12 (twelve) hours.   potassium chloride SA 20 MEQ tablet Commonly known as:  K-DUR,KLOR-CON Take 2 tablets (40 mEq total) by mouth daily.   rivaroxaban 20 MG Tabs tablet Commonly known as:  XARELTO Take 1 tablet (20 mg total) by mouth at bedtime.   torsemide 20 MG tablet Commonly known as:  DEMADEX Take 20 mg by mouth daily as needed (for weight gain greater than 3lbs.).   zolpidem 10 MG tablet Commonly known as:  AMBIEN Take 1 tablet (10 mg total) by mouth at bedtime as needed for sleep.         Consults:  None  Significant Diagnostic Studies:  Dg Chest 2 View  Result Date: 01/03/2016 CLINICAL DATA:  Chest and back pain for 5 days.  Sickle cell anemia. EXAM: CHEST  2 VIEW COMPARISON:  01/01/2016 FINDINGS: Mild to moderate cardiomegaly remains stable. Left-sided power port remains in appropriate position. Chronic pulmonary vascular congestion is again demonstrated. No evidence of acute infiltrate or pulmonary edema. No evidence of pleural effusion. IMPRESSION: Stable cardiomegaly and pulmonary venous hypertension. No acute findings. Electronically Signed   By: Earle Gell M.D.   On: 01/03/2016 15:18   Dg Chest 2 View  Result Date: 01/01/2016 CLINICAL DATA:  Sickle cell pain crisis. Bilateral anterior rib pain. Initial encounter. EXAM: CHEST  2 VIEW COMPARISON:  Chest radiograph performed 12/27/2015 FINDINGS: The lungs are well-aerated and clear. There is no evidence of focal opacification, pleural effusion or pneumothorax. The heart is mildly enlarged. A left-sided chest port is noted ending about the distal SVC. No acute osseous abnormalities are seen. IMPRESSION: Mild cardiomegaly. Lungs remain grossly clear. No displaced rib fracture seen. Electronically Signed   By: Garald Balding M.D.   On: 01/01/2016 02:32     [facility] Course: Patient transitioned to the sickle cell day infusion center from the emergency department. Patient's labs were reviewed, consistent with previous values. He was started on high concentration  PCA dilaudid due to opiate tolerance. He used a total of 19.6 mg with 33 demands and 28 deliveries. Pain intensity decreased from 9/10 to 5/10. Patient will discharge home on current medication regimen. Recommend that patient follow up in clinic as scheduled. He is alert, oriented, and ambulating. He will discharge home with family in stable condition.   The patient was given clear instructions to go to ER or return to medical center if symptoms do  not improve, worsen or new problems develop. The patient verbalized understanding.  Physical Exam at Discharge:  BP 100/62 (BP Location: Right Arm)   Pulse 86   Temp 98.1 F (36.7 C) (Oral)   Resp 18   Ht 6' (1.829 m)   Wt 180 lb (81.6 kg)   SpO2 94%   BMI 24.41 kg/m BP 100/62 (BP Location: Right Arm)   Pulse 86   Temp 98.1 F (36.7 C) (Oral)   Resp 18   Ht 6' (1.829 m)   Wt 180 lb (81.6 kg)   SpO2 94%   BMI 24.41 kg/m   General Appearance:    Alert, cooperative, no distress, appears stated age  Head:    Normocephalic, without obvious abnormality, atraumatic  Eyes:    PERRL, conjunctiva/corneas clear, EOM's intact, fundi    benign, both eyes       Lungs:     Clear to auscultation bilaterally, respirations unlabored  Chest wall:    No tenderness or deformity  Heart:    Regular rate and rhythm, S1 and S2 normal, no murmur, rub   or gallop  Abdomen:     Soft, non-tender, bowel sounds active all four quadrants,    no masses, no organomegaly  Extremities:   Extremities normal, atraumatic, no cyanosis or edema    Disposition at Discharge: Home  Discharge Orders: Discharge Instructions    Discharge patient    Complete by:  As directed       Condition at Discharge:   Stable  Time spent on Discharge:  15 minutes  Signed: Oral Hallgren M 01/29/2016, 3:02 PM

## 2016-01-29 NOTE — Telephone Encounter (Signed)
Called patient back and told he could come to the day hospital for treatment. Pt voiced understanding.

## 2016-01-29 NOTE — H&P (Signed)
[facility] History and Physical   Date: 01/29/2016  Patient name: [name] Medical record number: [id number] Date of birth: 1980-09-09 Age: 35 y.o. Gender: male PCP: Gerald Chessman, MD  Attending physician: Gerald Garter, MD  Chief Complaint: Right flank pain  History of Present Illness: [name], a 36 year old male with a history of sickle cell anemia, HbSS presents with lower extremity pain. He states that pain started several days ago and is consistent with typical sickle cell pain.  He has not identified any palliative or provocative factors. Current pain intensity is 9/10 primarily to legs and ankles. Patient was evaluated in the emergency department on 01/24/2016. He has been taking home medications consistently without sustained relief. He last had dilaudid around 6 am. He currently denies headache, neuropathy, shortness of breath, dysuria, nausea, vomiting, or diarrhea.   Meds: Prescriptions Prior to Admission  Medication Sig Dispense Refill Last Dose  . ambrisentan (LETAIRIS) 5 MG tablet Take 5 mg by mouth at bedtime.   01/23/2016 at Unknown time  . aspirin 81 MG chewable tablet Chew 1 tablet (81 mg total) by mouth daily. 30 tablet 11 01/23/2016 at Unknown time  . cholecalciferol (VITAMIN D) 1000 units tablet Take 2,000 Units by mouth daily.   01/23/2016 at Unknown time  . Deferasirox (JADENU) 360 MG TABS Take 1,080 mg by mouth daily.    01/23/2016 at Unknown time  . folic acid (FOLVITE) 1 MG tablet Take 1 mg by mouth daily.   01/23/2016 at Unknown time  . gabapentin (NEURONTIN) 300 MG capsule Take 1 capsule (300 mg total) by mouth 3 (three) times daily. 270 capsule 3 01/23/2016 at Unknown time  . hydroxyurea (HYDREA) 500 MG capsule Take 2 capsules (1,000 mg total) by mouth daily. May take with food to minimize GI side effects. Hold Hydrea until evaluated by Primary Provider or Hematologist.   01/23/2016 at Unknown time  . lisinopril  (PRINIVIL,ZESTRIL) 5 MG tablet Take 1 tablet (5 mg total) by mouth daily. 30 tablet 1 01/23/2016 at Unknown time  . morphine (MS CONTIN) 30 MG 12 hr tablet Take 1 tablet (30 mg total) by mouth every 12 (twelve) hours. 60 tablet 0 01/23/2016 at Unknown time  . potassium chloride SA (K-DUR,KLOR-CON) 20 MEQ tablet Take 2 tablets (40 mEq total) by mouth daily. 30 tablet 1 01/23/2016 at Unknown time  . rivaroxaban (XARELTO) 20 MG TABS tablet Take 1 tablet (20 mg total) by mouth at bedtime. 30 tablet 3 01/23/2016 at 2000  . torsemide (DEMADEX) 20 MG tablet Take 20 mg by mouth daily as needed (for weight gain greater than 3lbs.).    01/23/2016 at Unknown time  . zolpidem (AMBIEN) 10 MG tablet Take 1 tablet (10 mg total) by mouth at bedtime as needed for sleep. 30 tablet 0 01/23/2016 at Unknown time    Allergies: Patient has no known allergies. Past Medical History:  Diagnosis Date  . Acute chest syndrome ([place]) 06/18/2013  . Acute embolism and thrombosis of right internal jugular vein ([place])   . Alcohol consumption of one to four drinks per day   . Avascular necrosis (HCC)    Right Hip  . Blood transfusion   . Chronic anticoagulation   . Demand ischemia ([place]) 01/02/2014  . Former smoker   . Functional asplenia   . Hb-SS disease with crisis ([place])   . History of Clostridium difficile infection   . History of pulmonary embolus (PE)   . Hypertension   .  Hypokalemia   . Leukocytosis    Chronic  . Mood disorder ([place])   . Noncompliance with medication regimen   . Oxygen deficiency   . Pulmonary hypertension   . Second hand tobacco smoke exposure   . Sickle cell anemia (HCC)   . Sickle-cell crisis with associated acute chest syndrome ([place]) 05/13/2013  . Stroke ([place])   . Thrombocytosis (HCC)    Chronic  . Uses marijuana    Past Surgical History:  Procedure Laterality Date  . CHOLECYSTECTOMY     01/2008  . Excision of left periauricular cyst     10/2009  . Excision of right ear lobe cyst with  primary closur     11/2007  . Porta cath placement    . Porta cath removal    . PORTACATH PLACEMENT  01/05/2012   Procedure: INSERTION PORT-A-CATH;  Surgeon: Odis Hollingshead, MD;  Location: [facility];  Service: General;  Laterality: N/A;  ultrasound guiced port a cath insertion with fluoroscopy  . Right hip replacement     08/2006  . UMBILICAL HERNIA REPAIR     01/2008   Family History  Problem Relation Age of Onset  . Sickle cell trait Mother   . Depression Mother   . Diabetes Mother   . Sickle cell trait Father   . Sickle cell trait Brother    Social History   Social History  . Marital status: Single    Spouse name: N/A  . Number of children: 0  . Years of education: 82   Occupational History  . 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 Smoking status: Former Smoker    Packs/day: 0.50    Years: 10.00    Types: Cigarettes    Quit date: 05/29/2011  . Smokeless tobacco: Never Used  . Alcohol use No  . Drug use: No     Comment: Marijuana weekly  . Sexual activity: Yes    Partners: Female     Comment: month ago   Other Topics Concern  . Not on file   Social History Narrative   Lives in an apartment.  Single.  Lives alone but has a girlfriend that helps care for him.  Does not use any assist devices.        [name]  [phone] Mom, emergency contact      [name] Pulmonary:   Patient continuing to live in her apartment in town alone. 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    Review of Systems: Eyes: positive for icterus Ears, nose, mouth, throat, and face: negative for earaches, hoarseness and nasal congestion Respiratory: negative for cough and dyspnea on exertion Cardiovascular: positive for fatigue Gastrointestinal: negative for constipation and nausea Genitourinary:negative for dysuria Integument/breast: negative Hematologic/lymphatic: negative Musculoskeletal:positive for  myalgias Neurological: negative Behavioral/Psych: negative Endocrine: negative  Physical Exam: There were no vitals taken for this visit. There were no vitals taken for this visit.  General Appearance:    Alert, cooperative, mild distress, appears older thanstated age  Head:    Normocephalic, without obvious abnormality, atraumatic  Eyes:    PERRL, conjunctiva/corneas clear, EOM's intact, fundi    Benign, icterus both eyes       Ears:    Normal TM's and external ear canals, both ears  Neck:   Supple, symmetrical, trachea midline, no adenopathy;       thyroid:  No enlargement/tenderness/nodules; no carotid  bruit or JVD  Back:     Symmetric, no curvature, ROM normal, no CVA tenderness  Lungs:     Clear to auscultation bilaterally, respirations unlabored  Chest wall:    No tenderness or deformity  Heart:    Irregular rate and rhythm  Abdomen:     Soft, bowel sounds active all four quadrants,    no masses, no organomegaly  Extremities:   Extremities normal, atraumatic, no cyanosis or edema  Pulses:   2+ and symmetric all extremities  Skin:   Skin color, texture, turgor normal, no rashes or lesions  Lymph nodes:   Cervical, supraclavicular, and axillary nodes normal  Neurologic:   CNII-XII intact. Normal strength, sensation and reflexes      throughout     Lab results:No results found. However, due to the size of the patient record, not all encounters were searched. Please check Results Review for a complete set of results.  Imaging results:  No results found.   Assessment & Plan:  Patient will be admitted to the day infusion center for extended observation  Start IV D5.45 for cellular rehydration at 100/hr  Will hold Toradol for inflammation due to anti-coagulation therapy  Start Dilaudid PCA High Concentration per weight based protocol.   Patient will be re-evaluated for pain intensity in the context of function and relationship to baseline as care progresses.  If no  significant pain relief, will transfer patient to inpatient services for a higher level of care.   Reviewed labs, consistent with previous values   Hollis,Lachina M 01/29/2016, 8:51 AM

## 2016-01-31 ENCOUNTER — Inpatient Hospital Stay (HOSPITAL_COMMUNITY)
Admission: EM | Admit: 2016-01-31 | Discharge: 2016-02-07 | DRG: 811 | Disposition: A | Discharge status: Home / Self Care | Payer: Medicare Other | Attending: Internal Medicine | Admitting: Internal Medicine

## 2016-01-31 ENCOUNTER — Emergency Department (HOSPITAL_COMMUNITY): Payer: Medicare Other

## 2016-01-31 ENCOUNTER — Encounter (HOSPITAL_COMMUNITY): Payer: Self-pay | Admitting: Nurse Practitioner

## 2016-01-31 DIAGNOSIS — Z87891 Personal history of nicotine dependence: Secondary | ICD-10-CM | POA: Diagnosis not present

## 2016-01-31 DIAGNOSIS — I2781 Cor pulmonale (chronic): Secondary | ICD-10-CM | POA: Diagnosis present

## 2016-01-31 DIAGNOSIS — G894 Chronic pain syndrome: Secondary | ICD-10-CM | POA: Diagnosis present

## 2016-01-31 DIAGNOSIS — I248 Other forms of acute ischemic heart disease: Secondary | ICD-10-CM | POA: Diagnosis present

## 2016-01-31 DIAGNOSIS — I2729 Other secondary pulmonary hypertension: Secondary | ICD-10-CM | POA: Diagnosis present

## 2016-01-31 DIAGNOSIS — D5700 Hb-SS disease with crisis, unspecified: Principal | ICD-10-CM

## 2016-01-31 DIAGNOSIS — Z7901 Long term (current) use of anticoagulants: Secondary | ICD-10-CM | POA: Diagnosis not present

## 2016-01-31 DIAGNOSIS — Z833 Family history of diabetes mellitus: Secondary | ICD-10-CM | POA: Diagnosis not present

## 2016-01-31 DIAGNOSIS — I10 Essential (primary) hypertension: Secondary | ICD-10-CM | POA: Diagnosis present

## 2016-01-31 DIAGNOSIS — J9621 Acute and chronic respiratory failure with hypoxia: Secondary | ICD-10-CM | POA: Diagnosis not present

## 2016-01-31 DIAGNOSIS — Z818 Family history of other mental and behavioral disorders: Secondary | ICD-10-CM | POA: Diagnosis not present

## 2016-01-31 DIAGNOSIS — R0989 Other specified symptoms and signs involving the circulatory and respiratory systems: Secondary | ICD-10-CM | POA: Diagnosis not present

## 2016-01-31 DIAGNOSIS — Z86711 Personal history of pulmonary embolism: Secondary | ICD-10-CM | POA: Diagnosis not present

## 2016-01-31 DIAGNOSIS — Z832 Family history of diseases of the blood and blood-forming organs and certain disorders involving the immune mechanism: Secondary | ICD-10-CM | POA: Diagnosis not present

## 2016-01-31 DIAGNOSIS — Z7982 Long term (current) use of aspirin: Secondary | ICD-10-CM | POA: Diagnosis not present

## 2016-01-31 DIAGNOSIS — D638 Anemia in other chronic diseases classified elsewhere: Secondary | ICD-10-CM | POA: Diagnosis present

## 2016-01-31 DIAGNOSIS — Z7722 Contact with and (suspected) exposure to environmental tobacco smoke (acute) (chronic): Secondary | ICD-10-CM | POA: Diagnosis present

## 2016-01-31 DIAGNOSIS — Z9119 Patient's noncompliance with other medical treatment and regimen: Secondary | ICD-10-CM | POA: Diagnosis not present

## 2016-01-31 LAB — CBC WITH DIFFERENTIAL/PLATELET
BASOS ABS: 0 10*3/uL (ref 0.0–0.1)
Basophils Relative: 0 %
EOS PCT: 2 %
Eosinophils Absolute: 0.3 10*3/uL (ref 0.0–0.7)
HCT: 24.4 % — ABNORMAL LOW (ref 39.0–52.0)
Hemoglobin: 8.4 g/dL — ABNORMAL LOW (ref 13.0–17.0)
Lymphocytes Relative: 19 %
Lymphs Abs: 2.5 10*3/uL (ref 0.7–4.0)
MCH: 31.2 pg (ref 26.0–34.0)
MCHC: 34.4 g/dL (ref 30.0–36.0)
MCV: 90.7 fL (ref 78.0–100.0)
MONOS PCT: 8 %
Monocytes Absolute: 1.1 10*3/uL — ABNORMAL HIGH (ref 0.1–1.0)
NEUTROS ABS: 9.5 10*3/uL — AB (ref 1.7–7.7)
NEUTROS PCT: 71 %
PLATELETS: 578 10*3/uL — AB (ref 150–400)
RBC: 2.69 MIL/uL — AB (ref 4.22–5.81)
RDW: 20.9 % — ABNORMAL HIGH (ref 11.5–15.5)
WBC: 13.4 10*3/uL — AB (ref 4.0–10.5)
nRBC: 9 /100 WBC — ABNORMAL HIGH

## 2016-01-31 LAB — COMPREHENSIVE METABOLIC PANEL
ALT: 25 U/L (ref 17–63)
AST: 29 U/L (ref 15–41)
Albumin: 4.7 g/dL (ref 3.5–5.0)
Alkaline Phosphatase: 97 U/L (ref 38–126)
Anion gap: 9 (ref 5–15)
BUN: 12 mg/dL (ref 6–20)
CHLORIDE: 107 mmol/L (ref 101–111)
CO2: 22 mmol/L (ref 22–32)
Calcium: 8.9 mg/dL (ref 8.9–10.3)
Creatinine, Ser: 0.9 mg/dL (ref 0.61–1.24)
GFR calc Af Amer: >60 mL/min (ref >60)
Glucose, Bld: 110 mg/dL — ABNORMAL HIGH (ref 65–99)
POTASSIUM: 3.9 mmol/L (ref 3.5–5.1)
Sodium: 138 mmol/L (ref 135–145)
Total Bilirubin: 4.9 mg/dL — ABNORMAL HIGH (ref 0.3–1.2)
Total Protein: 7.1 g/dL (ref 6.5–8.1)

## 2016-01-31 LAB — RETICULOCYTES
RBC.: 2.69 MIL/uL — AB (ref 4.22–5.81)
RETIC COUNT ABSOLUTE: 304 10*3/uL — AB (ref 19.0–186.0)
RETIC CT PCT: 11.3 % — AB (ref 0.4–3.1)

## 2016-01-31 MED ORDER — DIPHENHYDRAMINE HCL 50 MG/ML IJ SOLN
25.0000 mg | Freq: Once | INTRAMUSCULAR | Status: AC
  Administered 2016-01-31: 25 mg via INTRAVENOUS
  Filled 2016-01-31: qty 1

## 2016-01-31 MED ORDER — RIVAROXABAN 20 MG PO TABS
20.0000 mg | ORAL_TABLET | Freq: Every day | ORAL | Status: DC
  Administered 2016-01-31 – 2016-02-06 (×7): 20 mg via ORAL
  Filled 2016-01-31 (×7): qty 1

## 2016-01-31 MED ORDER — ONDANSETRON HCL 4 MG/2ML IJ SOLN
4.0000 mg | Freq: Once | INTRAMUSCULAR | Status: AC
  Administered 2016-01-31: 4 mg via INTRAVENOUS
  Filled 2016-01-31: qty 2

## 2016-01-31 MED ORDER — AMBRISENTAN 5 MG PO TABS
5.0000 mg | ORAL_TABLET | Freq: Every day | ORAL | Status: DC
  Administered 2016-02-02 – 2016-02-06 (×5): 5 mg via ORAL

## 2016-01-31 MED ORDER — MORPHINE SULFATE ER 30 MG PO TBCR
30.0000 mg | EXTENDED_RELEASE_TABLET | Freq: Two times a day (BID) | ORAL | Status: DC
  Administered 2016-01-31 – 2016-02-07 (×15): 30 mg via ORAL
  Filled 2016-01-31 (×15): qty 1

## 2016-01-31 MED ORDER — POLYETHYLENE GLYCOL 3350 17 G PO PACK: 17.0000 g | PACK | Freq: Every day | ORAL | Status: DC | PRN

## 2016-01-31 MED ORDER — HYDROMORPHONE HCL 2 MG/ML IJ SOLN
2.0000 mg | INTRAMUSCULAR | Status: AC
  Administered 2016-01-31: 2 mg via INTRAVENOUS
  Filled 2016-01-31: qty 1

## 2016-01-31 MED ORDER — KETOROLAC TROMETHAMINE 30 MG/ML IJ SOLN
30.0000 mg | Freq: Once | INTRAMUSCULAR | Status: AC
  Administered 2016-01-31: 30 mg via INTRAVENOUS
  Filled 2016-01-31: qty 1

## 2016-01-31 MED ORDER — HYDROMORPHONE HCL 2 MG/ML IJ SOLN
2.0000 mg | INTRAMUSCULAR | Status: AC
  Administered 2016-01-31 (×3): 2 mg via INTRAVENOUS
  Filled 2016-01-31 (×3): qty 1

## 2016-01-31 MED ORDER — POTASSIUM CHLORIDE CRYS ER 20 MEQ PO TBCR
40.0000 meq | EXTENDED_RELEASE_TABLET | Freq: Every day | ORAL | Status: DC
  Administered 2016-01-31 – 2016-02-07 (×8): 40 meq via ORAL
  Filled 2016-01-31 (×8): qty 2

## 2016-01-31 MED ORDER — KETOROLAC TROMETHAMINE 30 MG/ML IJ SOLN
30.0000 mg | Freq: Four times a day (QID) | INTRAMUSCULAR | Status: AC
  Administered 2016-01-31 – 2016-02-05 (×20): 30 mg via INTRAVENOUS
  Filled 2016-01-31 (×20): qty 1

## 2016-01-31 MED ORDER — SODIUM CHLORIDE 0.9 % IV SOLN
25.0000 mg | INTRAVENOUS | Status: DC | PRN
  Filled 2016-01-31: qty 0.5

## 2016-01-31 MED ORDER — ZOLPIDEM TARTRATE 10 MG PO TABS
10.0000 mg | ORAL_TABLET | Freq: Every evening | ORAL | Status: DC | PRN
  Administered 2016-01-31 – 2016-02-06 (×7): 10 mg via ORAL
  Filled 2016-01-31 (×7): qty 1

## 2016-01-31 MED ORDER — NALOXONE HCL 0.4 MG/ML IJ SOLN: 0.4000 mg | INTRAMUSCULAR | Status: DC | PRN

## 2016-01-31 MED ORDER — HYDROMORPHONE 1 MG/ML IV SOLN
INTRAVENOUS | Status: DC
  Administered 2016-01-31: 25 mg via INTRAVENOUS
  Administered 2016-01-31: 5.4 mg via INTRAVENOUS
  Administered 2016-01-31: 2.4 mg via INTRAVENOUS
  Administered 2016-02-01: 2.87 mg via INTRAVENOUS
  Administered 2016-02-01: 3 mg via INTRAVENOUS
  Administered 2016-02-01: 7.8 mg via INTRAVENOUS
  Administered 2016-02-01: 2.4 mg via INTRAVENOUS
  Administered 2016-02-01: 4.8 mg via INTRAVENOUS
  Administered 2016-02-01: 2.4 mg via INTRAVENOUS
  Administered 2016-02-01: 25 mg via INTRAVENOUS
  Administered 2016-02-02: 3.79 mg via INTRAVENOUS
  Administered 2016-02-02: 4.8 mg via INTRAVENOUS
  Administered 2016-02-02: 1 mg via INTRAVENOUS
  Administered 2016-02-02: 25 mg via INTRAVENOUS
  Administered 2016-02-02: 11.2 mg via INTRAVENOUS
  Administered 2016-02-03: 6.6 mg via INTRAVENOUS
  Administered 2016-02-03: 2 mg via INTRAVENOUS
  Administered 2016-02-03: 25 mg via INTRAVENOUS
  Filled 2016-01-31 (×4): qty 25

## 2016-01-31 MED ORDER — GABAPENTIN 300 MG PO CAPS
300.0000 mg | ORAL_CAPSULE | Freq: Three times a day (TID) | ORAL | Status: DC
  Administered 2016-01-31 – 2016-02-07 (×21): 300 mg via ORAL
  Filled 2016-01-31 (×21): qty 1

## 2016-01-31 MED ORDER — DIPHENHYDRAMINE HCL 25 MG PO CAPS
25.0000 mg | ORAL_CAPSULE | ORAL | Status: DC | PRN
  Administered 2016-01-31 – 2016-02-04 (×3): 50 mg via ORAL
  Filled 2016-01-31 (×3): qty 2

## 2016-01-31 MED ORDER — SENNOSIDES-DOCUSATE SODIUM 8.6-50 MG PO TABS
1.0000 | ORAL_TABLET | Freq: Two times a day (BID) | ORAL | Status: DC
  Administered 2016-01-31 – 2016-02-07 (×15): 1 via ORAL
  Filled 2016-01-31 (×15): qty 1

## 2016-01-31 MED ORDER — ONDANSETRON HCL 4 MG/2ML IJ SOLN: 4.0000 mg | Freq: Four times a day (QID) | INTRAMUSCULAR | Status: DC | PRN

## 2016-01-31 MED ORDER — HYDROMORPHONE HCL 2 MG/ML IJ SOLN
2.0000 mg | INTRAMUSCULAR | Status: DC | PRN
  Administered 2016-01-31 (×4): 2 mg via INTRAVENOUS
  Filled 2016-01-31 (×4): qty 1

## 2016-01-31 MED ORDER — TORSEMIDE 20 MG PO TABS
20.0000 mg | ORAL_TABLET | Freq: Every day | ORAL | Status: DC | PRN
  Filled 2016-01-31: qty 1

## 2016-01-31 MED ORDER — FOLIC ACID 1 MG PO TABS
1.0000 mg | ORAL_TABLET | Freq: Every day | ORAL | Status: DC
  Administered 2016-01-31 – 2016-02-07 (×8): 1 mg via ORAL
  Filled 2016-01-31 (×8): qty 1

## 2016-01-31 MED ORDER — DEXTROSE-NACL 5-0.9 % IV SOLN
INTRAVENOUS | Status: DC
  Administered 2016-01-31: 19:00:00 via INTRAVENOUS
  Administered 2016-01-31: 125 mL/h via INTRAVENOUS
  Administered 2016-01-31: 04:00:00 via INTRAVENOUS
  Administered 2016-02-01: 125 mL/h via INTRAVENOUS
  Administered 2016-02-01 – 2016-02-03 (×3): via INTRAVENOUS

## 2016-01-31 MED ORDER — HYDROXYUREA 500 MG PO CAPS
1000.0000 mg | ORAL_CAPSULE | Freq: Every day | ORAL | Status: DC
  Administered 2016-01-31 – 2016-02-07 (×8): 1000 mg via ORAL
  Filled 2016-01-31 (×8): qty 2

## 2016-01-31 MED ORDER — LISINOPRIL 5 MG PO TABS
5.0000 mg | ORAL_TABLET | Freq: Every day | ORAL | Status: DC
  Administered 2016-01-31 – 2016-02-07 (×8): 5 mg via ORAL
  Filled 2016-01-31 (×8): qty 1

## 2016-01-31 MED ORDER — SODIUM CHLORIDE 0.9% FLUSH: 9.0000 mL | INTRAVENOUS | Status: DC | PRN

## 2016-01-31 MED ORDER — ASPIRIN 81 MG PO CHEW
81.0000 mg | CHEWABLE_TABLET | Freq: Every day | ORAL | Status: DC
  Administered 2016-02-01 – 2016-02-07 (×7): 81 mg via ORAL
  Filled 2016-01-31 (×7): qty 1

## 2016-01-31 MED ORDER — VITAMIN D3 25 MCG (1000 UNIT) PO TABS
2000.0000 [IU] | ORAL_TABLET | Freq: Every day | ORAL | Status: DC
  Administered 2016-01-31 – 2016-02-07 (×8): 2000 [IU] via ORAL
  Filled 2016-01-31 (×8): qty 2

## 2016-01-31 MED ORDER — DEFERASIROX 360 MG PO TABS
1080.0000 mg | ORAL_TABLET | Freq: Every day | ORAL | Status: DC
  Administered 2016-02-02 – 2016-02-07 (×7): 1080 mg via ORAL

## 2016-01-31 NOTE — ED Provider Notes (Signed)
Patient care taken over from [facility], [place].   Patient well known to the ED presenting with sickle cell crisis. Patient states he has his usual pain bilateral rib cage. Patient states that his pain is now well controlled at home. No evidence of acute chest syndrome at this time. Patient afebrile, vital signs stable and comfortable. Patient was given 2mg  Dilaudid 3. On last dose of Dilaudid patient desaturated and was then put on 4 L of oxygen via nasal cannula. Patient was then weaned off to 2 L. Patient had declined admission to previous PA. On my evaluation patient continued to have 8/10 pain and now requesting admission.  Patient given another dose of Dilaudid.  I called Dr. Jonelle Sidle who said to wait 1 hour after the previous Dilaudid was given to recheck for pain relief and see if he still wanted to be admitted, if so then to call him back.     On reevaluation patient still feeling 7/10 pain and continues to want to be admitted.    Dr. Jonelle Sidle agreed to admit patient for further evaluation.   [place], [place] 01/31/16 1639    Virgel Manifold, MD 02/04/16 1330

## 2016-01-31 NOTE — ED Notes (Signed)
Attempted to call report. Receiving RN to call back for report.

## 2016-01-31 NOTE — H&P (Signed)
[name] is an 35 y.o. male.   Chief Complaint: pain in legs and chest HPI: 35 year old gentleman presenting to the ER with pain in his legs and chest. He has known sickle cell disease. He was just discharged from the hospital a few days ago. He reports his pain still at 10 out of 10 not responded to his home medication. His also has significant shortness of breath. His oxygen desaturated to the 80s in the emergency room. Patient has significant pulmonary hypertension and is supposed to be on home O2 but not taking it. He denied any fever or chills. Also no nausea, vomiting or diarrhea. He has multiple other medical problems which appear to be stable.  Past Medical History:  Diagnosis Date  . Acute chest syndrome ([place]) 06/18/2013  . Acute embolism and thrombosis of right internal jugular vein ([place])   . Alcohol consumption of one to four drinks per day   . Avascular necrosis (HCC)    Right Hip  . Blood transfusion   . Chronic anticoagulation   . Demand ischemia ([place]) 01/02/2014  . Former smoker   . Functional asplenia   . Hb-SS disease with crisis ([place])   . History of Clostridium difficile infection   . History of pulmonary embolus (PE)   . Hypertension   . Hypokalemia   . Leukocytosis    Chronic  . Mood disorder ([place])   . Noncompliance with medication regimen   . Oxygen deficiency   . Pulmonary hypertension   . Second hand tobacco smoke exposure   . Sickle cell anemia (HCC)   . Sickle-cell crisis with associated acute chest syndrome ([place]) 05/13/2013  . Stroke ([place])   . Thrombocytosis (HCC)    Chronic  . Uses marijuana     Past Surgical History:  Procedure Laterality Date  . CHOLECYSTECTOMY     01/2008  . Excision of left periauricular cyst     10/2009  . Excision of right ear lobe cyst with primary closur     11/2007  . Porta cath placement    . Porta cath removal    . PORTACATH PLACEMENT  01/05/2012   Procedure: INSERTION PORT-A-CATH;  Surgeon: Odis Hollingshead,  MD;  Location: [facility];  Service: General;  Laterality: N/A;  ultrasound guiced port a cath insertion with fluoroscopy  . Right hip replacement     08/2006  . UMBILICAL HERNIA REPAIR     01/2008    Family History  Problem Relation Age of Onset  . Sickle cell trait Mother   . Depression Mother   . Diabetes Mother   . Sickle cell trait Father   . Sickle cell trait Brother    Social History:  reports that he quit smoking about 4 years ago. His smoking use included Cigarettes. He has a 5.00 pack-year smoking history. He has never used smokeless tobacco. He reports that he does not drink alcohol or use drugs.  Allergies: No Known Allergies   (Not in a hospital admission)  Results for orders placed or performed during the hospital encounter of 01/31/16 (from the past 48 hour(s))  Comprehensive metabolic panel     Status: Abnormal   Collection Time: 01/31/16  3:45 AM  Result Value Ref Range   Sodium 138 135 - 145 mmol/L   Potassium 3.9 3.5 - 5.1 mmol/L   Chloride 107 101 - 111 mmol/L   CO2 22 22 - 32 mmol/L   Glucose, Bld 110 (H) 65 - 99 mg/dL  BUN 12 6 - 20 mg/dL   Creatinine, Ser 0.90 0.61 - 1.24 mg/dL   Calcium 8.9 8.9 - 10.3 mg/dL   Total Protein 7.1 6.5 - 8.1 g/dL   Albumin 4.7 3.5 - 5.0 g/dL   AST 29 15 - 41 U/L   ALT 25 17 - 63 U/L   Alkaline Phosphatase 97 38 - 126 U/L   Total Bilirubin 4.9 (H) 0.3 - 1.2 mg/dL   GFR calc non Af Amer >60 >60 mL/min   GFR calc Af Amer >60 >60 mL/min    Comment: (NOTE) The eGFR has been calculated using the CKD EPI equation. This calculation has not been validated in all clinical situations. eGFR's persistently <60 mL/min signify possible Chronic Kidney Disease.    Anion gap 9 5 - 15  CBC with Differential     Status: Abnormal   Collection Time: 01/31/16  3:45 AM  Result Value Ref Range   WBC 13.4 (H) 4.0 - 10.5 K/uL   RBC 2.69 (L) 4.22 - 5.81 MIL/uL   Hemoglobin 8.4 (L) 13.0 - 17.0 g/dL   HCT 24.4 (L) 39.0 - 52.0 %   MCV 90.7 78.0  - 100.0 fL   MCH 31.2 26.0 - 34.0 pg   MCHC 34.4 30.0 - 36.0 g/dL   RDW 20.9 (H) 11.5 - 15.5 %   Platelets 578 (H) 150 - 400 K/uL   Neutrophils Relative % 71 %   Lymphocytes Relative 19 %   Monocytes Relative 8 %   Eosinophils Relative 2 %   Basophils Relative 0 %   nRBC 9 (H) 0 /100 WBC   Neutro Abs 9.5 (H) 1.7 - 7.7 K/uL   Lymphs Abs 2.5 0.7 - 4.0 K/uL   Monocytes Absolute 1.1 (H) 0.1 - 1.0 K/uL   Eosinophils Absolute 0.3 0.0 - 0.7 K/uL   Basophils Absolute 0.0 0.0 - 0.1 K/uL   RBC Morphology TARGET CELLS     Comment: SICKLE CELLS   WBC Morphology VACUOLATED NEUTROPHILS    Smear Review GIANT PLATELETS SEEN     Comment: LARGE PLATELETS PRESENT  Reticulocytes     Status: Abnormal   Collection Time: 01/31/16  3:45 AM  Result Value Ref Range   Retic Ct Pct 11.3 (H) 0.4 - 3.1 %   RBC. 2.69 (L) 4.22 - 5.81 MIL/uL   Retic Count, Manual 304.0 (H) 19.0 - 186.0 K/uL   *Note: Due to a large number of results and/or encounters for the requested time period, some results have not been displayed. A complete set of results can be found in Results Review.   Dg Chest 2 View  Result Date: 01/31/2016 CLINICAL DATA:  Bilateral rib pain. History of hypertension, chronic pulmonary embolism and sickle cell anemia. EXAM: CHEST  2 VIEW COMPARISON:  Chest radiograph January 27, 2016 and CT chest January 05, 2016 FINDINGS: Cardiac silhouette is moderately enlarged and unchanged. Mediastinal silhouette is nonsuspicious. Pulmonary vascular congestion without pleural effusion or focal consolidation. RIGHT mid and lower lung zone atelectasis/scarring. No pneumothorax. Single lumen LEFT chest Port-A-Cath. Surgical clips in the included right abdomen compatible with cholecystectomy. Osseous structures are nonacute. Focal sclerosis in the humeral head compatible with infarcts. IMPRESSION: Stable cardiomegaly and pulmonary vascular congestion. RIGHT atelectasis/scarring. Electronically Signed   By: Elon Alas M.D.   On: 01/31/2016 04:46    Review of Systems  Constitutional: Negative.   HENT: Negative.   Eyes: Negative.   Respiratory: Negative.   Cardiovascular: Negative.   Gastrointestinal:  Negative.   Genitourinary: Negative.   Musculoskeletal: Positive for back pain, joint pain and myalgias.  Skin: Negative.   Neurological: Negative.   Endo/Heme/Allergies: Negative.   Psychiatric/Behavioral: Negative.     Blood pressure 113/79, pulse 88, temperature 97.5 F (36.4 C), temperature source Oral, resp. rate 18, height 6' (1.829 m), weight 81.6 kg (180 lb), SpO2 92 %. Physical Exam  Constitutional: He is oriented to person, place, and time. He appears well-developed and well-nourished.  HENT:  Head: Normocephalic and atraumatic.  Right Ear: External ear normal.  Eyes: Conjunctivae are normal. Pupils are equal, round, and reactive to light.  Neck: Normal range of motion. Neck supple.  Cardiovascular: Normal rate and regular rhythm.   Respiratory: Effort normal and breath sounds normal.  GI: Soft. Bowel sounds are normal.  Musculoskeletal: Normal range of motion. He exhibits tenderness.  Neurological: He is alert and oriented to person, place, and time.  Skin: Skin is warm and dry.  Psychiatric: He has a normal mood and affect.     Assessment/Plan A 35 yo man with sickle cell Painful crisis.  1. Sickle cell Painful Crisis: Patient will be admitted to med/surg. Start Dilaudid PCA with Toradol and IVF. Will treat using Sickle protocol. Counsel patient on medication compliance.  2. Hypoxemia; Patient is supposed to be on home oxygen. His oxygen saturation is in the 90s on 2L oxygen. Will continue oxygen in the Hospital  3. HTN: continue home medications  4. Hx of PE: On chronic anticoagulation. We will continue  5. Anemia of Chronic disease: Monitor Oren Section, MD 01/31/2016, 1:43 PM

## 2016-01-31 NOTE — ED Notes (Signed)
No respiratory or acute distress noted alert and oriented x 3 call light in reach no reaction to medication noted urinal given to pt.  Water given per request.

## 2016-01-31 NOTE — ED Notes (Signed)
Spoke with EDPA. EDPA verbally stated that he only wanted the patient to receive Dilaudid 2 mg and Benadryl x1 not the consecutive doses ordered.

## 2016-01-31 NOTE — ED Triage Notes (Signed)
Pt is c/o a SCC bilateral rib pain. States home medications did not offer relief.

## 2016-01-31 NOTE — ED Provider Notes (Signed)
[facility] DEPT Provider Note   CSN: [id number] Arrival date & time: 01/31/16  0137    History   Chief Complaint Chief Complaint  Patient presents with  . Sickle Cell Pain Crisis    HPI [name] is a 35 y.o. male.  35 year old male with a history of sickle cell anemia, acute chest syndrome, avascular necrosis of the right hip, and hypertension presents to the emergency department for evaluation of left-sided rib pain. Symptoms have been waxing and waning in severity. Pain is aching in nature. The pain is temporarily relieved by his home opioid medications, but pain will return shortly after. Patient denies shortness of breath as well as fevers, lightheadedness, central chest pain, nausea, vomiting, or syncope. He states that his pain today feels consistent with past sickle cell crisis pain.  PCP - Dr. Doreene Burke     Past Medical History:  Diagnosis Date  . Acute chest syndrome ([place]) 06/18/2013  . Acute embolism and thrombosis of right internal jugular vein ([place])   . Alcohol consumption of one to four drinks per day   . Avascular necrosis (HCC)    Right Hip  . Blood transfusion   . Chronic anticoagulation   . Demand ischemia ([place]) 01/02/2014  . Former smoker   . Functional asplenia   . Hb-SS disease with crisis ([place])   . History of Clostridium difficile infection   . History of pulmonary embolus (PE)   . Hypertension   . Hypokalemia   . Leukocytosis    Chronic  . Mood disorder ([place])   . Noncompliance with medication regimen   . Oxygen deficiency   . Pulmonary hypertension   . Second hand tobacco smoke exposure   . Sickle cell anemia (HCC)   . Sickle-cell crisis with associated acute chest syndrome ([place]) 05/13/2013  . Stroke ([place])   . Thrombocytosis (HCC)    Chronic  . Uses marijuana     Patient Active Problem List   Diagnosis Date Noted  . Flank pain 01/16/2016  . Abdominal pain   . Colitis 12/15/2015  . Slurred speech 11/10/2015  . Pulmonary emboli  ([place]) 11/10/2015  . Aphasia   . Acute chest syndrome ([place])   . Ischemic stroke ([place])   . Sickle cell crisis ([place]) 10/27/2015  . Sickle cell pain crisis ([place]) 09/18/2015  . Sickle cell anemia with crisis ([place]) 09/04/2015  . Shortness of breath   . Pulmonary embolism without acute cor pulmonale ([place])   . Fever   . Sickle cell anemia with pain ([place]) 05/07/2015  . Acute kidney injury ([place])   . Hypoxia   . Anemia 01/31/2015  . Chronic anemia   . Hb-SS disease without crisis ([place]) 10/07/2014  . Acute on chronic respiratory failure with hypoxia ([place])   . PVC's (premature ventricular contractions) 09/10/2014  . Abnormal EKG 09/10/2014  . Chronic atrial fibrillation ([place]) 09/09/2014  . Cor pulmonale, chronic ([place]) 08/25/2014  . Sickle cell anemia ([place]) 06/25/2014  . Anemia of chronic disease 06/25/2014  . PAH (pulmonary artery hypertension) 03/18/2014  . Hb-SS disease with crisis ([place]) 01/22/2014  . Pulmonary embolus ([place]) 01/01/2014  . Paralytic strabismus, external ophthalmoplegia   . Chronic pain syndrome 12/12/2013  . Elevated troponin 11/26/2013  . Chronic anticoagulation 08/22/2013  . Essential hypertension 08/22/2013  . Pulmonary HTN 06/18/2013  . Functional asplenia   . Vitamin D deficiency 02/13/2013  . Hx of pulmonary embolus 06/29/2012  . Hemochromatosis 12/14/2011  . Avascular necrosis ([place])  Past Surgical History:  Procedure Laterality Date  . CHOLECYSTECTOMY     01/2008  . Excision of left periauricular cyst     10/2009  . Excision of right ear lobe cyst with primary closur     11/2007  . Porta cath placement    . Porta cath removal    . PORTACATH PLACEMENT  01/05/2012   Procedure: INSERTION PORT-A-CATH;  Surgeon: Odis Hollingshead, MD;  Location: [name];  Service: General;  Laterality: N/A;  ultrasound guiced port a cath insertion with fluoroscopy  . Right hip replacement     08/2006  . UMBILICAL HERNIA REPAIR     01/2008       Home Medications     Prior to Admission medications   Medication Sig Start Date End Date Taking? Authorizing Provider  ambrisentan (LETAIRIS) 5 MG tablet Take 5 mg by mouth at bedtime.   Yes Historical Provider, MD  aspirin 81 MG chewable tablet Chew 1 tablet (81 mg total) by mouth daily. 07/23/14  Yes [name], MD  cholecalciferol (VITAMIN D) 1000 units tablet Take 2,000 Units by mouth daily.   Yes Historical Provider, MD  Deferasirox (JADENU) 360 MG TABS Take 1,080 mg by mouth daily.    Yes Historical Provider, MD  folic acid (FOLVITE) 1 MG tablet Take 1 mg by mouth daily.   Yes Historical Provider, MD  gabapentin (NEURONTIN) 300 MG capsule Take 1 capsule (300 mg total) by mouth 3 (three) times daily. 06/01/15  Yes Tresa Garter, MD  hydroxyurea (HYDREA) 500 MG capsule Take 2 capsules (1,000 mg total) by mouth daily. May take with food to minimize GI side effects. Hold Hydrea until evaluated by Primary Provider or Hematologist. 12/22/15  Yes [name], MD  lisinopril (PRINIVIL,ZESTRIL) 5 MG tablet Take 1 tablet (5 mg total) by mouth daily. 08/04/15  Yes [name], MD  morphine (MS CONTIN) 30 MG 12 hr tablet Take 1 tablet (30 mg total) by mouth every 12 (twelve) hours. 12/29/15 01/31/16 Yes Olugbemiga Essie Christine, MD  potassium chloride SA (K-DUR,KLOR-CON) 20 MEQ tablet Take 2 tablets (40 mEq total) by mouth daily. 12/23/15  Yes [name], MD  rivaroxaban (XARELTO) 20 MG TABS tablet Take 1 tablet (20 mg total) by mouth at bedtime. 12/29/15  Yes Tresa Garter, MD  torsemide (DEMADEX) 20 MG tablet Take 20 mg by mouth daily as needed (for weight gain greater than 3lbs.).    Yes Historical Provider, MD  zolpidem (AMBIEN) 10 MG tablet Take 1 tablet (10 mg total) by mouth at bedtime as needed for sleep. 12/29/15 01/31/16 Yes Tresa Garter, MD    Family History Family History  Problem Relation Age of Onset  . Sickle cell trait Mother   . Depression Mother   .  Diabetes Mother   . Sickle cell trait Father   . Sickle cell trait Brother     Social History Social History  Substance Use Topics  . Smoking status: Former Smoker    Packs/day: 0.50    Years: 10.00    Types: Cigarettes    Quit date: 05/29/2011  . Smokeless tobacco: Never Used  . Alcohol use No     Allergies   Patient has no known allergies.   Review of Systems Review of Systems Ten systems reviewed and are negative for acute change, except as noted in the HPI.    Physical Exam Updated Vital Signs BP 106/76 (BP Location: Right Arm)  Pulse 83   Temp 98.2 F (36.8 C) (Oral)   Resp 15   Ht 6' (1.829 m)   Wt 81.6 kg   SpO2 90%   BMI 24.41 kg/m   Physical Exam  Constitutional: He is oriented to person, place, and time. He appears well-developed and well-nourished. No distress.  Nontoxic and in NAD  HENT:  Head: Normocephalic and atraumatic.  Eyes: Conjunctivae and EOM are normal.  Neck: Normal range of motion.  Cardiovascular: Normal rate, regular rhythm and intact distal pulses.   Murmur heard. Pulmonary/Chest: Effort normal. No respiratory distress. He has no wheezes. He has no rales. He exhibits tenderness.  TTP to the left anterior chest wall. No crepitus. Chest expansion symmetric.  Musculoskeletal: Normal range of motion.  Neurological: He is alert and oriented to person, place, and time. He exhibits normal muscle tone. Coordination normal.  GCS 15. Patient moving all extremities.  Skin: Skin is warm and dry. No rash noted. He is not diaphoretic. No erythema. No pallor.  Psychiatric: He has a normal mood and affect. His behavior is normal.  Nursing note and vitals reviewed.    ED Treatments / Results  Labs (all labs ordered are listed, but only abnormal results are displayed) Labs Reviewed  COMPREHENSIVE METABOLIC PANEL - Abnormal; Notable for the following:       Result Value   Glucose, Bld 110 (*)    Total Bilirubin 4.9 (*)    All other  components within normal limits  CBC WITH DIFFERENTIAL/PLATELET - Abnormal; Notable for the following:    WBC 13.4 (*)    RBC 2.69 (*)    Hemoglobin 8.4 (*)    HCT 24.4 (*)    RDW 20.9 (*)    Platelets 578 (*)    nRBC 9 (*)    Neutro Abs 9.5 (*)    Monocytes Absolute 1.1 (*)    All other components within normal limits  RETICULOCYTES - Abnormal; Notable for the following:    Retic Ct Pct 11.3 (*)    RBC. 2.69 (*)    Retic Count, Manual 304.0 (*)    All other components within normal limits    EKG  EKG Interpretation None       Radiology Dg Chest 2 View  Result Date: 01/31/2016 CLINICAL DATA:  Bilateral rib pain. History of hypertension, chronic pulmonary embolism and sickle cell anemia. EXAM: CHEST  2 VIEW COMPARISON:  Chest radiograph January 27, 2016 and CT chest January 05, 2016 FINDINGS: Cardiac silhouette is moderately enlarged and unchanged. Mediastinal silhouette is nonsuspicious. Pulmonary vascular congestion without pleural effusion or focal consolidation. RIGHT mid and lower lung zone atelectasis/scarring. No pneumothorax. Single lumen LEFT chest Port-A-Cath. Surgical clips in the included right abdomen compatible with cholecystectomy. Osseous structures are nonacute. Focal sclerosis in the humeral head compatible with infarcts. IMPRESSION: Stable cardiomegaly and pulmonary vascular congestion. RIGHT atelectasis/scarring. Electronically Signed   By: Elon Alas M.D.   On: 01/31/2016 04:46    Procedures Procedures (including critical care time)  Medications Ordered in ED Medications  dextrose 5 %-0.9 % sodium chloride infusion ( Intravenous New Bag/Given 01/31/16 0404)  HYDROmorphone (DILAUDID) injection 2 mg (2 mg Intravenous Given 01/31/16 0506)  ketorolac (TORADOL) 30 MG/ML injection 30 mg (30 mg Intravenous Given 01/31/16 0404)  ondansetron (ZOFRAN) injection 4 mg (4 mg Intravenous Given 01/31/16 0404)  diphenhydrAMINE (BENADRYL) injection 25 mg (25 mg  Intravenous Given 01/31/16 0404)     Initial Impression / Assessment and Plan / ED  Course  I have reviewed the triage vital signs and the nursing notes.  Pertinent labs & imaging results that were available during my care of the patient were reviewed by me and considered in my medical decision making (see chart for details).  Clinical Course     35 year old male with a history of sickle cell anemia presents to the emergency department for left-sided chest wall and rib pain consistent with past sickle cell crises. He states that his pain has not been well controlled with his home medications. He has no evidence of acute chest syndrome today. He is afebrile and laboratory workup is reassuring, consistent with baseline.   Patient has had adequate pain control with 2 mg Dilaudid 3. Unfortunately, patient did desaturate following his last dose of pain medicine requiring supplemental oxygen. Patient has been weaned from 4L oxygen via nasal cannula to 2L. He is currently satting 90-92%. He has no additional complaints and declines admission. Anticipate discharge when patient is able to maintain oxygen saturations on room air. Patient signed out to Heath Lark, PA-C at shift change who will monitor and disposition appropriately.   Final Clinical Impressions(s) / ED Diagnoses   Final diagnoses:  Sickle cell anemia with pain [facility])    New Prescriptions New Prescriptions   No medications on file     Antonietta Breach, PA-C 01/31/16 [id number]    Everlene Balls, MD 01/31/16 1400

## 2016-01-31 NOTE — Progress Notes (Signed)
Patients oxygen level  to 86% via nasal cannula @ 4 liters. Increased oxygen to 6 liters. Oxygen sat now 89% notified Dr. Jonelle Sidle notified no new orders at this time. Patient denies SOB.

## 2016-01-31 NOTE — ED Notes (Signed)
No respiratory or acute distress noted resting in bed with eyes closed awakens to verbal stimuli no reaction to medication noted.

## 2016-01-31 NOTE — ED Notes (Signed)
Notified provider pt sats on room air dropped to 79% placed on O2 4L now sats 94% no respiratory or acute distress noted no reaction to medication noted call light in reach.

## 2016-02-01 LAB — CBC WITH DIFFERENTIAL/PLATELET
BASOS ABS: 0.2 10*3/uL — AB (ref 0.0–0.1)
Basophils Relative: 1 %
EOS PCT: 3 %
Eosinophils Absolute: 0.5 10*3/uL (ref 0.0–0.7)
HEMATOCRIT: 23.5 % — AB (ref 39.0–52.0)
HEMOGLOBIN: 8.1 g/dL — AB (ref 13.0–17.0)
LYMPHS ABS: 2.3 10*3/uL (ref 0.7–4.0)
LYMPHS PCT: 14 %
MCH: 31.3 pg (ref 26.0–34.0)
MCHC: 34.5 g/dL (ref 30.0–36.0)
MCV: 90.7 fL (ref 78.0–100.0)
Monocytes Absolute: 1.8 10*3/uL — ABNORMAL HIGH (ref 0.1–1.0)
Monocytes Relative: 11 %
NEUTROS ABS: 11.7 10*3/uL — AB (ref 1.7–7.7)
Neutrophils Relative %: 71 %
Platelets: 532 10*3/uL — ABNORMAL HIGH (ref 150–400)
RBC: 2.59 MIL/uL — AB (ref 4.22–5.81)
RDW: 19.7 % — AB (ref 11.5–15.5)
WBC: 16.5 10*3/uL — AB (ref 4.0–10.5)

## 2016-02-01 LAB — COMPREHENSIVE METABOLIC PANEL
ALBUMIN: 4.5 g/dL (ref 3.5–5.0)
ALT: 25 U/L (ref 17–63)
ANION GAP: 6 (ref 5–15)
AST: 33 U/L (ref 15–41)
Alkaline Phosphatase: 89 U/L (ref 38–126)
BUN: 19 mg/dL (ref 6–20)
CHLORIDE: 110 mmol/L (ref 101–111)
CO2: 23 mmol/L (ref 22–32)
Calcium: 8.7 mg/dL — ABNORMAL LOW (ref 8.9–10.3)
Creatinine, Ser: 0.87 mg/dL (ref 0.61–1.24)
GFR calc Af Amer: >60 mL/min (ref >60)
GFR calc non Af Amer: >60 mL/min (ref >60)
GLUCOSE: 121 mg/dL — AB (ref 65–99)
POTASSIUM: 4.4 mmol/L (ref 3.5–5.1)
Sodium: 139 mmol/L (ref 135–145)
TOTAL PROTEIN: 7.7 g/dL (ref 6.5–8.1)
Total Bilirubin: 4.3 mg/dL — ABNORMAL HIGH (ref 0.3–1.2)

## 2016-02-01 MED ORDER — HYDROMORPHONE HCL 1 MG/ML IJ SOLN
2.0000 mg | INTRAMUSCULAR | Status: DC | PRN
  Administered 2016-02-01 – 2016-02-03 (×17): 2 mg via INTRAVENOUS
  Filled 2016-02-01 (×17): qty 2

## 2016-02-01 NOTE — Progress Notes (Signed)
Subjective: A 36 yo man admitted with sickle cell painful crisis. He is on dilaudid PCA with Toradol and IVF. Also physician assisted dosing. He has used 36 mg with 67 demands and 66 deliveries in the last 24 hours. Pain is still at 9/10. No SOB, No fever, no NVD.  Objective: Vital signs in last 24 hours: Temp:  [97 F (36.1 C)-98.9 F (37.2 C)] 97.3 F (36.3 C) (01/01 1533) Pulse Rate:  [96-112] 96 (01/01 1533) Resp:  [18-26] 21 (01/01 1614) BP: (108-122)/(73-89) 119/79 (01/01 1533) SpO2:  [86 %-95 %] 95 % (01/01 1614) Weight:  [81.6 kg (180 lb)] 81.6 kg (180 lb) (01/01 0622) Weight change: 0 kg (0 lb) Last BM Date: 01/31/16  Intake/Output from previous day: 12/31 0701 - 01/01 0700 In: 4726.1 [P.O.:1470; I.V.:3256.1] Out: 625 [Urine:625] Intake/Output this shift: Total I/O In: 120 [P.O.:120] Out: 900 [Urine:900]  General appearance: alert, cooperative and no distress Neck: no adenopathy, no carotid bruit, no JVD, supple, symmetrical, trachea midline and thyroid not enlarged, symmetric, no tenderness/mass/nodules Back: symmetric, no curvature. ROM normal. No CVA tenderness. Resp: clear to auscultation bilaterally Chest wall: no tenderness Cardio: regular rate and rhythm, S1, S2 normal, no murmur, click, rub or gallop GI: soft, non-tender; bowel sounds normal; no masses,  no organomegaly Extremities: extremities normal, atraumatic, no cyanosis or edema Pulses: 2+ and symmetric Skin: Skin color, texture, turgor normal. No rashes or lesions Neurologic: Grossly normal  Lab Results:  Recent Labs  01/31/16 0345 02/01/16 0535  WBC 13.4* 16.5*  HGB 8.4* 8.1*  HCT 24.4* 23.5*  PLT 578* 532*   BMET  Recent Labs  01/31/16 0345 02/01/16 0535  NA 138 139  K 3.9 4.4  CL 107 110  CO2 22 23  GLUCOSE 110* 121*  BUN 12 19  CREATININE 0.90 0.87  CALCIUM 8.9 8.7*    Studies/Results: Dg Chest 2 View  Result Date: 01/31/2016 CLINICAL DATA:  Bilateral rib pain. History  of hypertension, chronic pulmonary embolism and sickle cell anemia. EXAM: CHEST  2 VIEW COMPARISON:  Chest radiograph January 27, 2016 and CT chest January 05, 2016 FINDINGS: Cardiac silhouette is moderately enlarged and unchanged. Mediastinal silhouette is nonsuspicious. Pulmonary vascular congestion without pleural effusion or focal consolidation. RIGHT mid and lower lung zone atelectasis/scarring. No pneumothorax. Single lumen LEFT chest Port-A-Cath. Surgical clips in the included right abdomen compatible with cholecystectomy. Osseous structures are nonacute. Focal sclerosis in the humeral head compatible with infarcts. IMPRESSION: Stable cardiomegaly and pulmonary vascular congestion. RIGHT atelectasis/scarring. Electronically Signed   By: Elon Alas M.D.   On: 01/31/2016 04:46    Medications: I have reviewed the patient's current medications.  Assessment/Plan: A 36 yo man with sickle cell Painful crisis.  1. Sickle cell Painful Crisis: Will continue current Dilaudid PCA with Toradol and IVF. Will treat using Sickle protocol. Counsel patient on medication compliance. Reassess pain level tomorrow.  2. Hypoxemia; Patient is supposed to be on home oxygen. His oxygen saturation is in the 90s on 2L oxygen. Will continue oxygen in the Hospital  3. HTN: continue home medications  4. Hx of PE: On chronic anticoagulation. We will continue  5. Anemia of Chronic disease: Monitor H/H.   LOS: 1 day   Myrle Dues,[name] 02/01/2016, 5:24 PM

## 2016-02-02 DIAGNOSIS — I2781 Cor pulmonale (chronic): Secondary | ICD-10-CM

## 2016-02-02 DIAGNOSIS — Z86711 Personal history of pulmonary embolism: Secondary | ICD-10-CM

## 2016-02-02 DIAGNOSIS — J9621 Acute and chronic respiratory failure with hypoxia: Secondary | ICD-10-CM

## 2016-02-02 DIAGNOSIS — G894 Chronic pain syndrome: Secondary | ICD-10-CM

## 2016-02-02 LAB — CBC WITH DIFFERENTIAL/PLATELET
Basophils Absolute: 0 10*3/uL (ref 0.0–0.1)
Basophils Relative: 0 %
Eosinophils Absolute: 0.6 10*3/uL (ref 0.0–0.7)
Eosinophils Relative: 4 %
HCT: 22.1 % — ABNORMAL LOW (ref 39.0–52.0)
Hemoglobin: 7.6 g/dL — ABNORMAL LOW (ref 13.0–17.0)
LYMPHS ABS: 2.1 10*3/uL (ref 0.7–4.0)
Lymphocytes Relative: 13 %
MCH: 31.1 pg (ref 26.0–34.0)
MCHC: 34.4 g/dL (ref 30.0–36.0)
MCV: 90.6 fL (ref 78.0–100.0)
MONO ABS: 1.9 10*3/uL — AB (ref 0.1–1.0)
MONOS PCT: 12 %
NEUTROS PCT: 71 %
Neutro Abs: 11.2 10*3/uL — ABNORMAL HIGH (ref 1.7–7.7)
PLATELETS: 482 10*3/uL — AB (ref 150–400)
RBC: 2.44 MIL/uL — AB (ref 4.22–5.81)
RDW: 19.8 % — AB (ref 11.5–15.5)
WBC: 15.8 10*3/uL — AB (ref 4.0–10.5)

## 2016-02-02 LAB — COMPREHENSIVE METABOLIC PANEL
ALBUMIN: 4.1 g/dL (ref 3.5–5.0)
ALT: 29 U/L (ref 17–63)
AST: 40 U/L (ref 15–41)
Alkaline Phosphatase: 96 U/L (ref 38–126)
Anion gap: 4 — ABNORMAL LOW (ref 5–15)
BUN: 17 mg/dL (ref 6–20)
CHLORIDE: 115 mmol/L — AB (ref 101–111)
CO2: 23 mmol/L (ref 22–32)
CREATININE: 0.79 mg/dL (ref 0.61–1.24)
Calcium: 8.6 mg/dL — ABNORMAL LOW (ref 8.9–10.3)
GFR calc Af Amer: >60 mL/min (ref >60)
GFR calc non Af Amer: >60 mL/min (ref >60)
GLUCOSE: 110 mg/dL — AB (ref 65–99)
POTASSIUM: 4.5 mmol/L (ref 3.5–5.1)
Sodium: 142 mmol/L (ref 135–145)
Total Bilirubin: 4.3 mg/dL — ABNORMAL HIGH (ref 0.3–1.2)
Total Protein: 7.5 g/dL (ref 6.5–8.1)

## 2016-02-02 LAB — RETICULOCYTES
RBC.: 2.47 MIL/uL — ABNORMAL LOW (ref 4.22–5.81)
Retic Count, Absolute: 289 10*3/uL — ABNORMAL HIGH (ref 19.0–186.0)
Retic Ct Pct: 11.7 % — ABNORMAL HIGH (ref 0.4–3.1)

## 2016-02-02 NOTE — Progress Notes (Addendum)
SICKLE CELL SERVICE PROGRESS NOTE  [name] [id number] DOB: 26-Apr-1980 DOA: 01/31/2016 PCP: [name], MD  Assessment/Plan: Principal Problem:   Hb-SS disease with crisis ([place]) Active Problems:   Cor pulmonale, chronic (HCC)   Hx of pulmonary embolus   Chronic pain syndrome   Sickle cell anemia with crisis ([place])   Sickle cell crisis ([place])  1. Hb SS with crisis: Continue PCA and clinician assisted doses of Dilaudid.  Also continue Toradol.  2. Acute on Chronic Respiratory failure with Hypoxia: Pt has chronic respiratory failure due to [facility]. Pt admits that he is not compliant with Oxygen ATC as he does not have a portable tank or concentrator although they have been requested of the DME company on several hospitalizations. I have explained to patient that not wearing his Oxygen will lead to a worsening PAH as well as precipitating sickle cell crises and placing him at risk for complications such as another CVA.  3. Leukocytosis: Pt has a chronic leukocytosis which is likely related to sickle cell crisis. 4. Anemia of Chronic Disease: Hb at baseline.  5. S/P CVA: Pt is on serial RBC exchange program for secondary stroke prophylaxis. His last exchange was on 12/18 with post-exchange Hb S at 43%.  6. Chronic pain: Continue MS Contin.  7. PAH: Continue Ambrisentan 8. H/O recurrent PE with chronic anticoagulation: Continue Xarelto. 9. Medication non-compliance: Pt not compliant with Oxygen use. Will ask CM to speak with DME company about portable Oxygen tank. 10. Code Status: Currently he is listed as DNR. I have discussed this with patient for verification and he wants to be a full code and states that he has never expressed an intention for DNR. Code status changed to full code.   Code Status: Discussed with patient and he confirms Full Code Family Communication: N/A Disposition Plan: Not yet ready for discharge  [place].  Pager [phone]. If 7PM-7AM, please  contact night-coverage.  02/02/2016, 7:27 PM  LOS: 2 days   Interim History: PT states that pain is still 7-8/10 and localized to ribs, back and legs. He has used 28.8 mg with 50/48:demands/deliveries in the past 24 hours. Pt still requiring 3 L/min of Oxygen.   Consultants:  None  Procedures:  None  Antibiotics:  None    Objective: Vitals:   02/02/16 1205 02/02/16 1505 02/02/16 1637 02/02/16 1745  BP:  124/83  (!) 164/74  Pulse:  99  96  Resp: 19 20 19  (!) 21  Temp:  97.5 F (36.4 C)  97.8 F (36.6 C)  TempSrc:  Oral  Oral  SpO2: 92% (!) 89% 90% 92%  Weight:      Height:       Weight change: 3.553 kg (7 lb 13.3 oz)  Intake/Output Summary (Last 24 hours) at 02/02/16 1927 Last data filed at 02/02/16 1800  Gross per 24 hour  Intake          3610.11 ml  Output              500 ml  Net          3110.11 ml    General: Alert, awake, oriented x3, in no acute distress.  HEENT: [place]/AT PEERL, EOMI, anicteric Neck: Trachea midline,  no masses, no thyromegal,y no JVD, no carotid bruit OROPHARYNX:  Moist, No exudate/ erythema/lesions.  Heart: Regular rate and rhythm, without murmurs, rubs, gallops, PMI non-displaced, no heaves or thrills on palpation.  Lungs: Clear to auscultation, no wheezing or rhonchi noted.  No increased vocal fremitus resonant to percussion  Abdomen: Soft, nontender, nondistended, positive bowel sounds, no masses no hepatosplenomegaly noted..  Neuro: No focal neurological deficits noted cranial nerves II through XII grossly intact.  Strength at baseline in bilateral upper and lower extremities. Musculoskeletal: No warm swelling or erythema around joints, no spinal tenderness noted. Psychiatric: Patient alert and oriented x3, good insight and cognition, good recent to remote recall. .   Data Reviewed: Basic Metabolic Panel:  Recent Labs Lab 01/29/16 0926 01/31/16 0345 02/01/16 0535 02/02/16 0552  NA 135 138 139 142  K 3.8 3.9 4.4 4.5  CL 106  107 110 115*  CO2 23 22 23 23   GLUCOSE 109* 110* 121* 110*  BUN 9 12 19 17   CREATININE 0.72 0.90 0.87 0.79  CALCIUM 9.0 8.9 8.7* 8.6*   Liver Function Tests:  Recent Labs Lab 01/29/16 0926 01/31/16 0345 02/01/16 0535 02/02/16 0552  AST 31 29 33 40  ALT 22 25 25 29   ALKPHOS 86 97 89 96  BILITOT 5.5* 4.9* 4.3* 4.3*  PROT 7.6 7.1 7.7 7.5  ALBUMIN 4.4 4.7 4.5 4.1   No results for input(s): LIPASE, AMYLASE in the last 168 hours. No results for input(s): AMMONIA in the last 168 hours. CBC:  Recent Labs Lab 01/29/16 0926 01/31/16 0345 02/01/16 0535 02/02/16 0552  WBC 13.0* 13.4* 16.5* 15.8*  NEUTROABS 10.6* 9.5* 11.7* 11.2*  HGB 8.5* 8.4* 8.1* 7.6*  HCT 24.5* 24.4* 23.5* 22.1*  MCV 89.4 90.7 90.7 90.6  PLT 564* 578* 532* 482*   Cardiac Enzymes: No results for input(s): CKTOTAL, CKMB, CKMBINDEX, TROPONINI in the last 168 hours. BNP (last 3 results)  Recent Labs  08/02/15 2307 11/28/15 0520  BNP 503.2* 422.5*    ProBNP (last 3 results) No results for input(s): PROBNP in the last 8760 hours.  CBG: No results for input(s): GLUCAP in the last 168 hours.  No results found for this or any previous visit (from the past 240 hour(s)).   Studies: Dg Chest 2 View  Result Date: 01/31/2016 CLINICAL DATA:  Bilateral rib pain. History of hypertension, chronic pulmonary embolism and sickle cell anemia. EXAM: CHEST  2 VIEW COMPARISON:  Chest radiograph January 27, 2016 and CT chest January 05, 2016 FINDINGS: Cardiac silhouette is moderately enlarged and unchanged. Mediastinal silhouette is nonsuspicious. Pulmonary vascular congestion without pleural effusion or focal consolidation. RIGHT mid and lower lung zone atelectasis/scarring. No pneumothorax. Single lumen LEFT chest Port-A-Cath. Surgical clips in the included right abdomen compatible with cholecystectomy. Osseous structures are nonacute. Focal sclerosis in the humeral head compatible with infarcts. IMPRESSION: Stable  cardiomegaly and pulmonary vascular congestion. RIGHT atelectasis/scarring. Electronically Signed   By: Elon Alas M.D.   On: 01/31/2016 04:46    Scheduled Meds: . ambrisentan  5 mg Oral QHS  . aspirin  81 mg Oral Daily  . cholecalciferol  2,000 Units Oral Daily  . Deferasirox  1,080 mg Oral Daily  . folic acid  1 mg Oral Daily  . gabapentin  300 mg Oral TID  . HYDROmorphone   Intravenous Q4H  . hydroxyurea  1,000 mg Oral Daily  . ketorolac  30 mg Intravenous Q6H  . lisinopril  5 mg Oral Daily  . morphine  30 mg Oral Q12H  . potassium chloride SA  40 mEq Oral Daily  . rivaroxaban  20 mg Oral QHS  . senna-docusate  1 tablet Oral BID   Continuous Infusions: . dextrose 5 % and 0.9% NaCl 10 mL/hr  at 02/02/16 1018    Principal Problem:   Hb-SS disease with crisis [facility]) Active Problems:   Cor pulmonale, chronic (HCC)   Hx of pulmonary embolus   Chronic pain syndrome   Sickle cell anemia with crisis ([place])   Sickle cell crisis ([place])   In excess of 25 minutes spent during this visit. Greater than 50% involved face to face contact with the patient for assessment, counseling and coordination of care.

## 2016-02-02 NOTE — Telephone Encounter (Signed)
Opened in error

## 2016-02-03 DIAGNOSIS — Z7901 Long term (current) use of anticoagulants: Secondary | ICD-10-CM

## 2016-02-03 MED ORDER — HYDROMORPHONE 1 MG/ML IV SOLN
INTRAVENOUS | Status: DC
  Administered 2016-02-03: 2 mg via INTRAVENOUS
  Administered 2016-02-03: 25 mg via INTRAVENOUS
  Administered 2016-02-03 – 2016-02-04 (×2): 2 mg via INTRAVENOUS
  Administered 2016-02-04: 25 mg via INTRAVENOUS
  Administered 2016-02-04 (×2): 2 mg via INTRAVENOUS
  Administered 2016-02-04: 13.39 mg via INTRAVENOUS
  Administered 2016-02-04: 2 mg via INTRAVENOUS
  Administered 2016-02-04: 25 mg via INTRAVENOUS
  Administered 2016-02-04 – 2016-02-05 (×4): 2 mg via INTRAVENOUS
  Administered 2016-02-05: 5.6 mg via INTRAVENOUS
  Filled 2016-02-03 (×3): qty 25

## 2016-02-03 MED ORDER — TORSEMIDE 20 MG PO TABS
20.0000 mg | ORAL_TABLET | Freq: Once | ORAL | Status: AC
  Administered 2016-02-03: 20 mg via ORAL
  Filled 2016-02-03: qty 1

## 2016-02-03 MED FILL — Hydromorphone HCl Preservative Free (PF) Inj 10 MG/ML: INTRAMUSCULAR | Qty: 2.5 | Status: AC

## 2016-02-03 NOTE — Progress Notes (Signed)
This CM contacted [facility] DME rep to inquire about small portable tank at home. This has been inquired about on previous admissions and [facility] ensured me they would follow up with pt at home. [facility] DME rep to contact pt in room to discuss his specific needs for home tank and assist with providing tank. CM will continue to follow and assist as needed. Marney Doctor RN,BSN,NCM [phone]

## 2016-02-03 NOTE — Progress Notes (Signed)
SICKLE CELL SERVICE PROGRESS NOTE  [name] [id number] DOB: [birth year] DOA: 01/31/2016 PCP: [name], MD  Assessment/Plan: Principal Problem:   Hb-SS disease with crisis ([place]) Active Problems:   Cor pulmonale, chronic (HCC)   Hx of pulmonary embolus   Chronic pain syndrome   Sickle cell anemia with crisis ([place])   Sickle cell crisis ([place])  1. Hb SS with crisis: Continue PCA and clinician assisted doses of Dilaudid.  Also continue Toradol. Plan to start weaning pain medications tomorrow based on current clinical progress.  2. Acute on Chronic Respiratory failure with Hypoxia: Pt has chronic respiratory failure due to [facility]. Pt admits that he is not compliant with Oxygen ATC as he does not have a portable tank or concentrator although they have been requested of the DME company on several hospitalizations. I have explained to patient that not wearing his Oxygen will lead to a worsening PAH as well as precipitating sickle cell crises and placing him at risk for complications such as another CVA.  3. Leukocytosis: Pt has a chronic leukocytosis which is likely related to sickle cell crisis. Currently WBC just above baseline.  4. Anemia of Chronic Disease: Hb at baseline.  5. S/P CVA: Pt is on serial RBC exchange program for secondary stroke prophylaxis. His last exchange was on 12/18 with post-exchange Hb S at 43%.  6. Chronic pain: Continue MS Contin.  7. PAH: Continue Ambrisentan 8. H/O recurrent PE with chronic anticoagulation: Continue Xarelto. 9. Medication non-compliance: Pt not compliant with Oxygen use. Will ask CM to speak with DME company about portable Oxygen tank. 10. Code Status: Currently he is listed as DNR. I have discussed this with patient for verification and he wants to be a full code and states that he has never expressed an intention for DNR. Code status changed to full code.   Code Status: Discussed with patient and he confirms Full Code Family  Communication: N/A Disposition Plan: Not yet ready for discharge  [place].  Pager [phone]. If 7PM-7AM, please contact night-coverage.  02/03/2016, 2:06 PM  LOS: 3 days   Interim History: PT states that pain is 7/10 and localized to ribs, back and legs. He has used 30.82 mg with 51/51:demands/deliveries in the past 24 hours. Pt still requiring 3 L/min of Oxygen. Last BM this morning.   Consultants:  None  Procedures:  None  Antibiotics:  None    Objective: Vitals:   02/03/16 0755 02/03/16 0931 02/03/16 0935 02/03/16 1149  BP:  (!) 104/57 (!) 104/57   Pulse:   79   Resp: 16  16 13   Temp:   98 F (36.7 C)   TempSrc:   Oral   SpO2: 94%  94% 94%  Weight:      Height:       Weight change:   Intake/Output Summary (Last 24 hours) at 02/03/16 1406 Last data filed at 02/03/16 0904  Gross per 24 hour  Intake               70 ml  Output             1450 ml  Net            -1380 ml    General: Alert, awake, oriented x3, in no acute distress.  HEENT: [place]/AT PEERL, EOMI, anicteric Neck: Trachea midline,  no masses, no thyromegal,y no JVD, no carotid bruit OROPHARYNX:  Moist, No exudate/ erythema/lesions.  Heart: Regular rate and rhythm, without murmurs, rubs, gallops,  PMI non-displaced, no heaves or thrills on palpation.  Lungs: Clear to auscultation, no wheezing or rhonchi noted. No increased vocal fremitus resonant to percussion  Abdomen: Soft, nontender, nondistended, positive bowel sounds, no masses no hepatosplenomegaly noted..  Neuro: No focal neurological deficits noted cranial nerves II through XII grossly intact.  Strength at baseline in bilateral upper and lower extremities. Musculoskeletal: No warm swelling or erythema around joints, no spinal tenderness noted. Psychiatric: Patient alert and oriented x3, good insight and cognition, good recent to remote recall. .   Data Reviewed: Basic Metabolic Panel:  Recent Labs Lab 01/29/16 0926  01/31/16 0345 02/01/16 0535 02/02/16 0552  NA 135 138 139 142  K 3.8 3.9 4.4 4.5  CL 106 107 110 115*  CO2 23 22 23 23   GLUCOSE 109* 110* 121* 110*  BUN 9 12 19 17   CREATININE 0.72 0.90 0.87 0.79  CALCIUM 9.0 8.9 8.7* 8.6*   Liver Function Tests:  Recent Labs Lab 01/29/16 0926 01/31/16 0345 02/01/16 0535 02/02/16 0552  AST 31 29 33 40  ALT 22 25 25 29   ALKPHOS 86 97 89 96  BILITOT 5.5* 4.9* 4.3* 4.3*  PROT 7.6 7.1 7.7 7.5  ALBUMIN 4.4 4.7 4.5 4.1   No results for input(s): LIPASE, AMYLASE in the last 168 hours. No results for input(s): AMMONIA in the last 168 hours. CBC:  Recent Labs Lab 01/29/16 0926 01/31/16 0345 02/01/16 0535 02/02/16 0552  WBC 13.0* 13.4* 16.5* 15.8*  NEUTROABS 10.6* 9.5* 11.7* 11.2*  HGB 8.5* 8.4* 8.1* 7.6*  HCT 24.5* 24.4* 23.5* 22.1*  MCV 89.4 90.7 90.7 90.6  PLT 564* 578* 532* 482*   Cardiac Enzymes: No results for input(s): CKTOTAL, CKMB, CKMBINDEX, TROPONINI in the last 168 hours. BNP (last 3 results)  Recent Labs  08/02/15 2307 11/28/15 0520  BNP 503.2* 422.5*    ProBNP (last 3 results) No results for input(s): PROBNP in the last 8760 hours.  CBG: No results for input(s): GLUCAP in the last 168 hours.  No results found for this or any previous visit (from the past 240 hour(s)).   Studies: Dg Chest 2 View  Result Date: 01/31/2016 CLINICAL DATA:  Bilateral rib pain. History of hypertension, chronic pulmonary embolism and sickle cell anemia. EXAM: CHEST  2 VIEW COMPARISON:  Chest radiograph January 27, 2016 and CT chest January 05, 2016 FINDINGS: Cardiac silhouette is moderately enlarged and unchanged. Mediastinal silhouette is nonsuspicious. Pulmonary vascular congestion without pleural effusion or focal consolidation. RIGHT mid and lower lung zone atelectasis/scarring. No pneumothorax. Single lumen LEFT chest Port-A-Cath. Surgical clips in the included right abdomen compatible with cholecystectomy. Osseous structures are  nonacute. Focal sclerosis in the humeral head compatible with infarcts. IMPRESSION: Stable cardiomegaly and pulmonary vascular congestion. RIGHT atelectasis/scarring. Electronically Signed   By: Elon Alas M.D.   On: 01/31/2016 04:46    Scheduled Meds: . ambrisentan  5 mg Oral QHS  . aspirin  81 mg Oral Daily  . cholecalciferol  2,000 Units Oral Daily  . Deferasirox  1,080 mg Oral Daily  . folic acid  1 mg Oral Daily  . gabapentin  300 mg Oral TID  . HYDROmorphone   Intravenous Q4H  . hydroxyurea  1,000 mg Oral Daily  . ketorolac  30 mg Intravenous Q6H  . lisinopril  5 mg Oral Daily  . morphine  30 mg Oral Q12H  . potassium chloride SA  40 mEq Oral Daily  . rivaroxaban  20 mg Oral QHS  . senna-docusate  1 tablet Oral BID   Continuous Infusions: . 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 Greater than 50% involved face to face contact with the patient for assessment, counseling and coordination of care.

## 2016-02-04 ENCOUNTER — Telehealth: Payer: Self-pay

## 2016-02-04 ENCOUNTER — Other Ambulatory Visit: Payer: Self-pay | Admitting: Internal Medicine

## 2016-02-04 LAB — RETICULOCYTES
RBC.: 2.35 MIL/uL — AB (ref 4.22–5.81)
RETIC COUNT ABSOLUTE: 260.9 10*3/uL — AB (ref 19.0–186.0)
RETIC CT PCT: 11.1 % — AB (ref 0.4–3.1)

## 2016-02-04 LAB — CBC WITH DIFFERENTIAL/PLATELET
BASOS ABS: 0.1 10*3/uL (ref 0.0–0.1)
BASOS PCT: 1 %
EOS ABS: 0.7 10*3/uL (ref 0.0–0.7)
Eosinophils Relative: 4 %
HCT: 21 % — ABNORMAL LOW (ref 39.0–52.0)
Hemoglobin: 7.4 g/dL — ABNORMAL LOW (ref 13.0–17.0)
Lymphocytes Relative: 14 %
Lymphs Abs: 2.2 10*3/uL (ref 0.7–4.0)
MCH: 31.5 pg (ref 26.0–34.0)
MCHC: 35.2 g/dL (ref 30.0–36.0)
MCV: 89.4 fL (ref 78.0–100.0)
MONO ABS: 1.9 10*3/uL — AB (ref 0.1–1.0)
Monocytes Relative: 12 %
NEUTROS ABS: 10.6 10*3/uL — AB (ref 1.7–7.7)
NEUTROS PCT: 69 %
Platelets: 451 10*3/uL — ABNORMAL HIGH (ref 150–400)
RBC: 2.35 MIL/uL — ABNORMAL LOW (ref 4.22–5.81)
RDW: 19.4 % — ABNORMAL HIGH (ref 11.5–15.5)
WBC: 15.3 10*3/uL — ABNORMAL HIGH (ref 4.0–10.5)

## 2016-02-04 LAB — BASIC METABOLIC PANEL
ANION GAP: 6 (ref 5–15)
BUN: 22 mg/dL — AB (ref 6–20)
CALCIUM: 8.7 mg/dL — AB (ref 8.9–10.3)
CO2: 24 mmol/L (ref 22–32)
CREATININE: 0.86 mg/dL (ref 0.61–1.24)
Chloride: 112 mmol/L — ABNORMAL HIGH (ref 101–111)
GFR calc Af Amer: >60 mL/min (ref >60)
GLUCOSE: 121 mg/dL — AB (ref 65–99)
Potassium: 4.9 mmol/L (ref 3.5–5.1)
Sodium: 142 mmol/L (ref 135–145)

## 2016-02-04 MED ORDER — HYDROMORPHONE HCL 4 MG PO TABS: 4.0000 mg | ORAL_TABLET | ORAL | 0 refills | Status: DC | PRN

## 2016-02-04 NOTE — Care Management Important Message (Signed)
Important Message  Patient Details  Name: [name] MRN: [id number] Date of Birth: December 03, 1980   Medicare Important Message Given:  Yes    Kerin Salen 02/04/2016, 10:35 AMImportant Message  Patient Details  Name: [name] MRN: [id number] Date of Birth: 01/27/1981   Medicare Important Message Given:  Yes    Kerin Salen 02/04/2016, 10:35 AM

## 2016-02-04 NOTE — Progress Notes (Signed)
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 Hb SS with crisis: Continue PCA at current dose and clinician assisted doses through tonight as pain is worse at night. Discontinue clinician doses in the morning  Also continue Toradol. 2. Acute on Chronic Respiratory failure with Hypoxia: Pt has chronic respiratory failure due to [facility]. Pt admits that he is not compliant with Oxygen ATC as he does not have a portable tank or concentrator although they have been requested of the DME company on several hospitalizations. I have explained to patient that not wearing his Oxygen will lead to a worsening PAH as well as precipitating sickle cell crises and placing him at risk for complications such as another CVA.  3. Leukocytosis: Pt has a chronic leukocytosis which is likely related to sickle cell crisis. Currently WBC just above baseline.  4. Anemia of Chronic Disease: Hb at baseline.  5. S/P CVA: Pt is on serial RBC exchange program for secondary stroke prophylaxis. His last exchange was on 12/18 with post-exchange Hb S at 43%.  6. Chronic pain: Continue MS Contin.  7. PAH: Continue Ambrisentan 8. H/O recurrent PE with chronic anticoagulation: Continue Xarelto. 9. Medication non-compliance: Pt not compliant with Oxygen use. Will ask CM to speak with DME company about portable Oxygen tank. 10. Code Status: Currently he is listed as DNR. I have discussed this with patient for verification and he wants to be a full code and states that he has never expressed an intention for DNR. Code status changed to full code.   Code Status: Discussed with patient and he confirms Full Code Family  Communication: N/A Disposition Plan: Not yet ready for discharge  [place].  Pager [phone]. If 7PM-7AM, please contact night-coverage.  02/04/2016, 7:44 PM  LOS: 4 days   Interim History: PT states that pain is back up to 8-9/10 and localized mostly  to ribs and back. He has used a total of 54.53 mg with 70/65:demands/deliveries in the past 24 hours. Pt still requiring 3 L/min of Oxygen. Last BM yesterday. He is encouraged to use the PCA more as his use is still very low.   Consultants:  None  Procedures:  None  Antibiotics:  None    Objective: Vitals:   02/04/16 1415 02/04/16 1519 02/04/16 1713 02/04/16 1810  BP:  117/77  110/72  Pulse:  85  80  Resp: 16 18 17 16   Temp:  98.7 F (37.1 C)  98 F (36.7 C)  TempSrc:  Oral  Oral  SpO2: 96% 94% 93% 93%  Weight:      Height:       Weight change:   Intake/Output Summary (Last 24 hours) at 02/04/16 1944 Last data filed at 02/04/16 1734  Gross per 24 hour  Intake           472.86 ml  Output             1650 ml  Net         -1177.14 ml    General: Alert, awake, oriented x3, visibly in pain and holding ribs.  HEENT: [place]/AT PEERL, EOMI, anicteric Neck: Trachea midline,  no masses, no thyromegal,y no JVD,  no carotid bruit OROPHARYNX:  Moist, No exudate/ erythema/lesions.  Heart: Regular rate and rhythm, without murmurs, rubs, gallops, PMI non-displaced, no heaves or thrills on palpation.  Lungs: Clear to auscultation, no wheezing or rhonchi noted. No increased vocal fremitus resonant to percussion  Abdomen: Soft, nontender, nondistended, positive bowel sounds, no masses no hepatosplenomegaly noted..  Neuro: No focal neurological deficits noted cranial nerves II through XII grossly intact.  Strength at baseline in bilateral upper and lower extremities. Musculoskeletal: No warmth swelling or erythema around joints, no spinal tenderness noted. Psychiatric: Patient alert and oriented x3, good insight and cognition,  good recent to remote recall. .   Data Reviewed: Basic Metabolic Panel:  Recent Labs Lab 01/29/16 0926 01/31/16 0345 02/01/16 0535 02/02/16 0552 02/04/16 0853  NA 135 138 139 142 142  K 3.8 3.9 4.4 4.5 4.9  CL 106 107 110 115* 112*  CO2 23 22 23 23 24   GLUCOSE 109* 110* 121* 110* 121*  BUN 9 12 19 17  22*  CREATININE 0.72 0.90 0.87 0.79 0.86  CALCIUM 9.0 8.9 8.7* 8.6* 8.7*   Liver Function Tests:  Recent Labs Lab 01/29/16 0926 01/31/16 0345 02/01/16 0535 02/02/16 0552  AST 31 29 33 40  ALT 22 25 25 29   ALKPHOS 86 97 89 96  BILITOT 5.5* 4.9* 4.3* 4.3*  PROT 7.6 7.1 7.7 7.5  ALBUMIN 4.4 4.7 4.5 4.1   No results for input(s): LIPASE, AMYLASE in the last 168 hours. No results for input(s): AMMONIA in the last 168 hours. CBC:  Recent Labs Lab 01/29/16 0926 01/31/16 0345 02/01/16 0535 02/02/16 0552 02/04/16 0853  WBC 13.0* 13.4* 16.5* 15.8* 15.3*  NEUTROABS 10.6* 9.5* 11.7* 11.2* 10.6*  HGB 8.5* 8.4* 8.1* 7.6* 7.4*  HCT 24.5* 24.4* 23.5* 22.1* 21.0*  MCV 89.4 90.7 90.7 90.6 89.4  PLT 564* 578* 532* 482* 451*   Cardiac Enzymes: No results for input(s): CKTOTAL, CKMB, CKMBINDEX, TROPONINI in the last 168 hours. BNP (last 3 results)  Recent Labs  08/02/15 2307 11/28/15 0520  BNP 503.2* 422.5*    ProBNP (last 3 results) No results for input(s): PROBNP in the last 8760 hours.  CBG: No results for input(s): GLUCAP in the last 168 hours.  No results found for this or any previous visit (from the past 240 hour(s)).   Studies: Dg Chest 2 View  Result Date: 01/31/2016 CLINICAL DATA:  Bilateral rib pain. History of hypertension, chronic pulmonary embolism and sickle cell anemia. EXAM: CHEST  2 VIEW COMPARISON:  Chest radiograph January 27, 2016 and CT chest January 05, 2016 FINDINGS: Cardiac silhouette is moderately enlarged and unchanged. Mediastinal silhouette is nonsuspicious. Pulmonary vascular congestion without pleural effusion or focal  consolidation. RIGHT mid and lower lung zone atelectasis/scarring. No pneumothorax. Single lumen LEFT chest Port-A-Cath. Surgical clips in the included right abdomen compatible with cholecystectomy. Osseous structures are nonacute. Focal sclerosis in the humeral head compatible with infarcts. IMPRESSION: Stable cardiomegaly and pulmonary vascular congestion. RIGHT atelectasis/scarring. Electronically Signed   By: Elon Alas M.D.   On: 01/31/2016 04:46    Scheduled Meds: . ambrisentan  5 mg Oral QHS  . aspirin  81 mg Oral Daily  . cholecalciferol  2,000 Units Oral Daily  . Deferasirox  1,080 mg Oral Daily  . folic acid  1 mg Oral Daily  . gabapentin  300 mg Oral TID  . HYDROmorphone   Intravenous Q4H  . hydroxyurea  1,000 mg Oral Daily  . ketorolac  30 mg Intravenous Q6H  .  lisinopril  5 mg Oral Daily  . morphine  30 mg Oral Q12H  . potassium chloride SA  40 mEq Oral Daily  . rivaroxaban  20 mg Oral QHS  . senna-docusate  1 tablet Oral BID   Continuous Infusions: . 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 Greater than 50% involved face to face contact with the patient for assessment, counseling and coordination of care.

## 2016-02-04 NOTE — Progress Notes (Signed)
This CM called [facility] DME rep to follow up on smaller portable tank for pt for DC home. This CM was informed that Pt would need to go to one of the [facility] DME retail stores to be evaluated for the 02 conserving device by doing a mobility assessment by one of their respiratory therapist. This can not be done in the hospital. This CM to inform pt and place on DC instructions. Marney Doctor RN,BSN,NCM [phone]

## 2016-02-05 MED ORDER — HYDROMORPHONE 1 MG/ML IV SOLN
INTRAVENOUS | Status: DC
  Administered 2016-02-05: 16.16 mg via INTRAVENOUS
  Administered 2016-02-05: 9 mg via INTRAVENOUS
  Administered 2016-02-05: 25 mg via INTRAVENOUS
  Administered 2016-02-06: 3.6 mg via INTRAVENOUS
  Administered 2016-02-06: 3 mg via INTRAVENOUS
  Administered 2016-02-06: 5.4 mg via INTRAVENOUS
  Filled 2016-02-05 (×2): qty 25

## 2016-02-05 NOTE — Progress Notes (Signed)
Patient ambulated 50' down hall off O2. Patient's O2 saturation dropped to 74% on room air. Patient returned to room, O2 sat to 87% on room air. Patient placed back on 2 L O2.. MD notified

## 2016-02-05 NOTE — Progress Notes (Signed)
Pt replaced on 3L oxygen. O2 sat 92% at rest.

## 2016-02-05 NOTE — Progress Notes (Signed)
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 Hb SS with crisis: Continue PCA at current dose and schedule oral dilaudid.  Discontinue clinician doses and plan for discharge home tomorrow. Toradol completed 2. Acute on Chronic Respiratory failure with Hypoxia: Oxygenation back to baseline. Pt has chronic respiratory failure due to [facility]. Pt admits that he is not compliant with Oxygen ATC as he does not have a portable tank or concentrator although they have been requested of the DME company on several hospitalizations. I have explained to patient that not wearing his Oxygen will lead to a worsening PAH as well as precipitating sickle cell crises and placing him at risk for complications such as another CVA.  3. Leukocytosis: Pt has a chronic leukocytosis which is likely related to sickle cell crisis. Currently WBC just above baseline.  4. Anemia of Chronic Disease: Hb at baseline.  5. S/P CVA: Pt is on serial RBC exchange program for secondary stroke prophylaxis. His last exchange was on 12/18 with post-exchange Hb S at 43%.  6. Chronic pain: Continue MS Contin.  7. PAH: Continue Ambrisentan 8. H/O recurrent PE with chronic anticoagulation: Continue Xarelto. 9. Medication non-compliance: Pt not compliant with Oxygen use. Will ask CM to speak with DME company about portable Oxygen tank. 10. Code Status: Currently he is listed as DNR. I have discussed this with patient for verification and he wants to be a full code and states that he has never expressed an intention for DNR. Code status changed to full code.   Code Status: Discussed with patient and he confirms Full Code Family  Communication: N/A Disposition Plan: Not yet ready for discharge  [place].  Pager [phone]. If 7PM-7AM, please contact night-coverage.  02/05/2016, 7:29 PM  LOS: 5 days   Interim History: PT states that pain is improved and at an intensity of 6/10. It is  localized mostly  to ribs and back.. Pt still requiring 3 L/min of Oxygen. Last BM yesterday. He is encouraged to use the PCA more as his use is still very low.   Consultants:  None  Procedures:  None  Antibiotics:  None    Objective: Vitals:   02/05/16 1141 02/05/16 1428 02/05/16 1607 02/05/16 1813  BP:  126/76  (!) 115/93  Pulse:  (!) 109  96  Resp: 13 15 17 14   Temp:  97.7 F (36.5 C)  98.3 F (36.8 C)  TempSrc:  Oral  Oral  SpO2: 90% 90% 90% 93%  Weight:      Height:       Weight change: -0.3 kg (-10.6 oz)  Intake/Output Summary (Last 24 hours) at 02/05/16 1929 Last data filed at 02/05/16 1814  Gross per 24 hour  Intake              720 ml  Output             1325 ml  Net             -605 ml    General: Alert, awake, oriented x3, in mild distress due to pain. HEENT: [place]/AT PEERL, EOMI, anicteric Neck: Trachea midline,  no masses, no thyromegal,y no JVD, no  carotid bruit OROPHARYNX:  Moist, No exudate/ erythema/lesions.  Heart: Regular rate and rhythm, without murmurs, rubs, gallops, PMI non-displaced, no heaves or thrills on palpation.  Lungs: Clear to auscultation, no wheezing or rhonchi noted. No increased vocal fremitus resonant to percussion  Abdomen: Soft, nontender, nondistended, positive bowel sounds, no masses no hepatosplenomegaly noted..  Neuro: No focal neurological deficits noted cranial nerves II through XII grossly intact.  Strength at baseline in bilateral upper and lower extremities. Musculoskeletal: No warmth swelling or erythema around joints, no spinal tenderness noted. Psychiatric: Patient alert and oriented x3, good insight and cognition, good recent to remote  recall. .   Data Reviewed: Basic Metabolic Panel:  Recent Labs Lab 01/31/16 0345 02/01/16 0535 02/02/16 0552 02/04/16 0853  NA 138 139 142 142  K 3.9 4.4 4.5 4.9  CL 107 110 115* 112*  CO2 22 23 23 24   GLUCOSE 110* 121* 110* 121*  BUN 12 19 17  22*  CREATININE 0.90 0.87 0.79 0.86  CALCIUM 8.9 8.7* 8.6* 8.7*   Liver Function Tests:  Recent Labs Lab 01/31/16 0345 02/01/16 0535 02/02/16 0552  AST 29 33 40  ALT 25 25 29   ALKPHOS 97 89 96  BILITOT 4.9* 4.3* 4.3*  PROT 7.1 7.7 7.5  ALBUMIN 4.7 4.5 4.1   No results for input(s): LIPASE, AMYLASE in the last 168 hours. No results for input(s): AMMONIA in the last 168 hours. CBC:  Recent Labs Lab 01/31/16 0345 02/01/16 0535 02/02/16 0552 02/04/16 0853  WBC 13.4* 16.5* 15.8* 15.3*  NEUTROABS 9.5* 11.7* 11.2* 10.6*  HGB 8.4* 8.1* 7.6* 7.4*  HCT 24.4* 23.5* 22.1* 21.0*  MCV 90.7 90.7 90.6 89.4  PLT 578* 532* 482* 451*   Cardiac Enzymes: No results for input(s): CKTOTAL, CKMB, CKMBINDEX, TROPONINI in the last 168 hours. BNP (last 3 results)  Recent Labs  08/02/15 2307 11/28/15 0520  BNP 503.2* 422.5*    ProBNP (last 3 results) No results for input(s): PROBNP in the last 8760 hours.  CBG: No results for input(s): GLUCAP in the last 168 hours.  No results found for this or any previous visit (from the past 240 hour(s)).   Studies: Dg Chest 2 View  Result Date: 01/31/2016 CLINICAL DATA:  Bilateral rib pain. History of hypertension, chronic pulmonary embolism and sickle cell anemia. EXAM: CHEST  2 VIEW COMPARISON:  Chest radiograph January 27, 2016 and CT chest January 05, 2016 FINDINGS: Cardiac silhouette is moderately enlarged and unchanged. Mediastinal silhouette is nonsuspicious. Pulmonary vascular congestion without pleural effusion or focal consolidation. RIGHT mid and lower lung zone atelectasis/scarring. No pneumothorax. Single lumen LEFT chest Port-A-Cath. Surgical clips in the included right abdomen  compatible with cholecystectomy. Osseous structures are nonacute. Focal sclerosis in the humeral head compatible with infarcts. IMPRESSION: Stable cardiomegaly and pulmonary vascular congestion. RIGHT atelectasis/scarring. Electronically Signed   By: Elon Alas M.D.   On: 01/31/2016 04:46    Scheduled Meds: . ambrisentan  5 mg Oral QHS  . aspirin  81 mg Oral Daily  . cholecalciferol  2,000 Units Oral Daily  . Deferasirox  1,080 mg Oral Daily  . folic acid  1 mg Oral Daily  . gabapentin  300 mg Oral TID  . HYDROmorphone   Intravenous Q4H  . hydroxyurea  1,000 mg Oral Daily  . lisinopril  5 mg Oral Daily  . morphine  30 mg Oral Q12H  . potassium chloride SA  40 mEq Oral Daily  . rivaroxaban  20 mg Oral QHS  .  senna-docusate  1 tablet Oral BID   Continuous Infusions: . 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 Greater than 50% involved face to face contact with the patient for assessment, counseling and coordination of care.

## 2016-02-06 LAB — BASIC METABOLIC PANEL
Anion gap: 4 — ABNORMAL LOW (ref 5–15)
BUN: 17 mg/dL (ref 6–20)
CALCIUM: 8.5 mg/dL — AB (ref 8.9–10.3)
CO2: 26 mmol/L (ref 22–32)
Chloride: 112 mmol/L — ABNORMAL HIGH (ref 101–111)
Creatinine, Ser: 0.96 mg/dL (ref 0.61–1.24)
Glucose, Bld: 107 mg/dL — ABNORMAL HIGH (ref 65–99)
Potassium: 4.8 mmol/L (ref 3.5–5.1)
SODIUM: 142 mmol/L (ref 135–145)

## 2016-02-06 LAB — CBC WITH DIFFERENTIAL/PLATELET
BASOS ABS: 0.1 10*3/uL (ref 0.0–0.1)
BASOS PCT: 1 %
Eosinophils Absolute: 0.6 10*3/uL (ref 0.0–0.7)
Eosinophils Relative: 6 %
HEMATOCRIT: 18.1 % — AB (ref 39.0–52.0)
HEMOGLOBIN: 6.3 g/dL — AB (ref 13.0–17.0)
Lymphocytes Relative: 20 %
Lymphs Abs: 2.1 10*3/uL (ref 0.7–4.0)
MCH: 31 pg (ref 26.0–34.0)
MCHC: 34.8 g/dL (ref 30.0–36.0)
MCV: 89.2 fL (ref 78.0–100.0)
Monocytes Absolute: 1.4 10*3/uL — ABNORMAL HIGH (ref 0.1–1.0)
Monocytes Relative: 14 %
NEUTROS ABS: 6.2 10*3/uL (ref 1.7–7.7)
NEUTROS PCT: 59 %
Platelets: 402 10*3/uL — ABNORMAL HIGH (ref 150–400)
RBC: 2.03 MIL/uL — ABNORMAL LOW (ref 4.22–5.81)
RDW: 19.1 % — ABNORMAL HIGH (ref 11.5–15.5)
WBC: 10.5 10*3/uL (ref 4.0–10.5)

## 2016-02-06 LAB — RETICULOCYTES
RBC.: 2.03 MIL/uL — AB (ref 4.22–5.81)
RETIC COUNT ABSOLUTE: 97.4 10*3/uL (ref 19.0–186.0)
Retic Ct Pct: 4.8 % — ABNORMAL HIGH (ref 0.4–3.1)

## 2016-02-06 LAB — PREPARE RBC (CROSSMATCH)

## 2016-02-06 MED ORDER — HYDROMORPHONE 1 MG/ML IV SOLN
INTRAVENOUS | Status: DC
  Administered 2016-02-06 (×2): 4.2 mg via INTRAVENOUS
  Administered 2016-02-06: 25 mg via INTRAVENOUS
  Administered 2016-02-06: 7.7 mg via INTRAVENOUS
  Administered 2016-02-07: 6.3 mg via INTRAVENOUS
  Administered 2016-02-07: 2.1 mg via INTRAVENOUS
  Filled 2016-02-06 (×2): qty 25

## 2016-02-06 MED ORDER — HYDROMORPHONE HCL 2 MG/ML IJ SOLN: 2.0000 mg | INTRAMUSCULAR | Status: DC | PRN

## 2016-02-06 MED ORDER — SODIUM CHLORIDE 0.9 % IV SOLN
Freq: Once | INTRAVENOUS | Status: AC
  Administered 2016-02-06: 13:00:00 via INTRAVENOUS

## 2016-02-06 NOTE — Progress Notes (Signed)
CRITICAL VALUE ALERT  Critical value received:  HGB 6.3  Date of notification: 02/06/16 Time of notification: 0615 Critical value read back:Yes Nurse who received alert:  Bo Mcclintock  MD notified (1st page): Donnal Debar  Time of first page: 0620  MD notified (2nd page):n/a  Time of second page:n/a  Responding MD: Donnal Debar  Time MD responded:  [phone]

## 2016-02-06 NOTE — Progress Notes (Signed)
Patient ID: ULYSSE LODICO, male   DOB: Aug 29, 1980, 36 y.o.   MRN: [id number] Subjective:  Patient was admitted for sickle cell pain crisis and Acute on Chronic Respiratory failure with Hypoxia, he said his pain is still at a 7/10 today. Denies difficulty with breathing, denies chest pain. No fever. Patient still needing up to 3 L of oxygen.   Objective:  Vital signs in last 24 hours:  Vitals:   02/06/16 0400 02/06/16 0458 02/06/16 0800 02/06/16 1002  BP:  100/64  119/80  Pulse:  94  79  Resp: 10 13 14 16   Temp:  97.8 F (36.6 C)  98.5 F (36.9 C)  TempSrc:  Oral  Oral  SpO2: 92% 96% 93% 93%  Weight:  84 kg (185 lb 3 oz)    Height:       Intake/Output from previous day:  Intake/Output Summary (Last 24 hours) at 02/06/16 1154 Last data filed at 02/06/16 0532  Gross per 24 hour  Intake          1313.33 ml  Output              725 ml  Net           588.33 ml    Physical Exam: General: Alert, awake, oriented x3, in no acute distress.  HEENT: [place]/AT PEERL, EOMI Neck: Trachea midline,  no masses, no thyromegal,y no JVD, no carotid bruit OROPHARYNX:  Moist, No exudate/ erythema/lesions.  Heart: Regular rate and rhythm, with systolic murmurs, Loud P2. No rubs, gallops, PMI non-displaced, no heaves or thrills on palpation.  Lungs: Clear to auscultation, no wheezing or rhonchi noted. No increased vocal fremitus resonant to percussion  Abdomen: Soft, nontender, nondistended, positive bowel sounds, no masses no hepatosplenomegaly noted..  Neuro: No focal neurological deficits noted cranial nerves II through XII grossly intact. DTRs 2+ bilaterally upper and lower extremities. Strength 5 out of 5 in bilateral upper and lower extremities. Musculoskeletal: No warm swelling or erythema around joints, no spinal tenderness noted. Psychiatric: Patient alert and oriented x3, good insight and cognition, good recent to remote recall. Lymph node survey: No cervical axillary or inguinal  lymphadenopathy noted.  Lab Results:  Basic Metabolic Panel:    Component Value Date/Time   NA 142 02/06/2016 0529   K 4.8 02/06/2016 0529   CL 112 (H) 02/06/2016 0529   CO2 26 02/06/2016 0529   BUN 17 02/06/2016 0529   CREATININE 0.96 02/06/2016 0529   CREATININE 0.64 08/22/2013 1235   GLUCOSE 107 (H) 02/06/2016 0529   CALCIUM 8.5 (L) 02/06/2016 0529   CBC:    Component Value Date/Time   WBC 10.5 02/06/2016 0529   HGB 6.3 (LL) 02/06/2016 0529   HCT 18.1 (L) 02/06/2016 0529   PLT 402 (H) 02/06/2016 0529   MCV 89.2 02/06/2016 0529   NEUTROABS 6.2 02/06/2016 0529   LYMPHSABS 2.1 02/06/2016 0529   MONOABS 1.4 (H) 02/06/2016 0529   EOSABS 0.6 02/06/2016 0529   BASOSABS 0.1 02/06/2016 0529    No results found for this or any previous visit (from the past 240 hour(s)).  Studies/Results: No results found.  Medications: Scheduled Meds: . sodium chloride   Intravenous Once  . ambrisentan  5 mg Oral QHS  . aspirin  81 mg Oral Daily  . cholecalciferol  2,000 Units Oral Daily  . Deferasirox  1,080 mg Oral Daily  . folic acid  1 mg Oral Daily  . gabapentin  300 mg Oral TID  .  HYDROmorphone   Intravenous Q4H  . hydroxyurea  1,000 mg Oral Daily  . lisinopril  5 mg Oral Daily  . morphine  30 mg Oral Q12H  . potassium chloride SA  40 mEq Oral Daily  . rivaroxaban  20 mg Oral QHS  . senna-docusate  1 tablet Oral BID   Continuous Infusions: . dextrose 5 % and 0.9% NaCl 10 mL/hr at 02/03/16 2142   PRN Meds:.diphenhydrAMINE **OR** diphenhydrAMINE (BENADRYL) IVPB(SICKLE CELL ONLY), naloxone **AND** sodium chloride flush, ondansetron (ZOFRAN) IV, polyethylene glycol, torsemide, zolpidem  Consultants:  None  Procedures:  None  Antibiotics:  None  Assessment/Plan: Principal Problem:   Hb-SS disease with crisis ([place]) Active Problems:   Hx of pulmonary embolus   Chronic pain syndrome   Cor pulmonale, chronic (HCC)   Sickle cell anemia with crisis (HCC)   Sickle  cell crisis ([place]) 1. Hb SS with crisis: Continue PCA at current dose and clinician assisted doses through tonight as pain is worse at night. Continue Toradol. 2. Acute on Chronic Respiratory failure with Hypoxia: Pt has chronic respiratory failure due to [name]. Pt admits that he is not compliant with Oxygen ATC as he does not have a portable tank or concentrator although they have been requested of the DME company on several hospitalizations. Patient's oxygenation has improved. 3. Leukocytosis: Pt has a chronic leukocytosis which is likely related to sickle cell crisis. Currently WBC just above baseline.  4. Anemia of Chronic Disease: Hb has dropped below his baseline. Because of significant PAH, will transfuse one unit of PRBC today 5. S/P CVA: Pt is on serial RBC exchange program for secondary stroke prophylaxis. His last exchange was on 12/18 with post-exchange Hb S at 43%.  6. Chronic pain: Continue MS Contin.  7. PAH: Continue Ambrisentan 8. H/O recurrent PE with chronic anticoagulation: Continue Xarelto. 9. Medication non-compliance: Pt not compliant with Oxygen use. CM spoke with DME company about portable Oxygen tank, arrangement being made to get portable oxygen for patient.    Code Status: Full Code Family Communication: N/A Disposition Plan: Not yet ready for discharge  Zareen Jamison  If 7PM-7AM, please contact night-coverage.  02/06/2016, 11:54 AM  LOS: 6 days

## 2016-02-07 LAB — COMPREHENSIVE METABOLIC PANEL
ALT: 25 U/L (ref 17–63)
ANION GAP: 4 — AB (ref 5–15)
AST: 43 U/L — ABNORMAL HIGH (ref 15–41)
Albumin: 3.8 g/dL (ref 3.5–5.0)
Alkaline Phosphatase: 109 U/L (ref 38–126)
BUN: 13 mg/dL (ref 6–20)
CHLORIDE: 111 mmol/L (ref 101–111)
CO2: 25 mmol/L (ref 22–32)
Calcium: 8.8 mg/dL — ABNORMAL LOW (ref 8.9–10.3)
Creatinine, Ser: 0.87 mg/dL (ref 0.61–1.24)
GFR calc non Af Amer: >60 mL/min (ref >60)
Glucose, Bld: 131 mg/dL — ABNORMAL HIGH (ref 65–99)
Potassium: 4.9 mmol/L (ref 3.5–5.1)
SODIUM: 140 mmol/L (ref 135–145)
Total Bilirubin: 4 mg/dL — ABNORMAL HIGH (ref 0.3–1.2)
Total Protein: 6.9 g/dL (ref 6.5–8.1)

## 2016-02-07 LAB — CBC WITH DIFFERENTIAL/PLATELET
BASOS ABS: 0.2 10*3/uL — AB (ref 0.0–0.1)
Basophils Relative: 2 %
EOS ABS: 0.7 10*3/uL (ref 0.0–0.7)
Eosinophils Relative: 7 %
HCT: 21 % — ABNORMAL LOW (ref 39.0–52.0)
Hemoglobin: 7.3 g/dL — ABNORMAL LOW (ref 13.0–17.0)
LYMPHS ABS: 2.3 10*3/uL (ref 0.7–4.0)
LYMPHS PCT: 24 %
MCH: 30.5 pg (ref 26.0–34.0)
MCHC: 34.8 g/dL (ref 30.0–36.0)
MCV: 87.9 fL (ref 78.0–100.0)
Monocytes Absolute: 1.4 10*3/uL — ABNORMAL HIGH (ref 0.1–1.0)
Monocytes Relative: 15 %
NEUTROS ABS: 5 10*3/uL (ref 1.7–7.7)
Neutrophils Relative %: 52 %
PLATELETS: 394 10*3/uL (ref 150–400)
RBC: 2.39 MIL/uL — ABNORMAL LOW (ref 4.22–5.81)
RDW: 19.4 % — AB (ref 11.5–15.5)
WBC: 9.6 10*3/uL (ref 4.0–10.5)

## 2016-02-07 NOTE — Discharge Summary (Signed)
Physician Discharge Summary  [name] [id number] DOB: [birth year] DOA: 01/31/2016  PCP: [name], MD  Admit date: 01/31/2016  Discharge date: 02/07/2016  Discharge Diagnoses:  Principal Problem:   Hb-SS disease with crisis ([place]) Active Problems:   Hx of pulmonary embolus   Chronic pain syndrome   Cor pulmonale, chronic (HCC)   Sickle cell anemia with crisis ([place])   Sickle cell crisis ([place])   Discharge Condition: Stable  Disposition:  [facility] Follow up.   Why:  Go to store to be evaluated for small conserving 02 tank. Contact information: [address] [id number] [phone]        [name], MD Follow up in 2 day(s).   Specialty:  Internal Medicine Contact information: [place] [address] [phone]          Diet: Regular Wt Readings from Last 3 Encounters:  02/07/16 83.3 kg (183 lb 10.3 oz)  01/29/16 81.6 kg (180 lb)  01/24/16 81.6 kg (180 lb)    History of present illness:  36 year old gentleman presenting to the ER with pain in his legs and chest. He has known sickle cell disease. He was just discharged from the hospital a few days ago. He reports his pain still at 10 out of 10 not responded to his home medication. He also has significant shortness of breath. His oxygen desaturated to the 80s in the emergency room. Patient has significant pulmonary hypertension and is supposed to be on home O2 but not taking it. He denied any fever or chills. Also no nausea, vomiting or diarrhea. He has multiple other medical problems which appear to be stable.  Hospital Course:  Patient was admitted for sickle cell pain crisis and Acute on Chronic Respiratory failure with Hypoxia. Pt has chronic respiratory failure due to [facility]. Pt admits that he is not compliant with Oxygen ATC as he does not have a portable tank or concentrator although they have been requested of  the DME company on several hospitalizations. Patient was educated that not wearing his Oxygen will lead to a worsening PAH as well as precipitating sickle cell crises and placing him at risk for complications such as another CVA. Case managers reviewed this with advanced home care and patient will be evaluated for portable oxygen DME. He was treated with standard sickle cell pain management protocol, his Hb dropped from 8.1 to 6.3. He was transfused with one unit of PRBC and Hb returned to baseline of 7.3. His pain level also returned to baseline and patient was discharged home in hemodynamically stable condition. He will follow up with me in the clinic in 2 days.   Discharge Exam: Vitals:   02/07/16 0412 02/07/16 0429  BP:  138/72  Pulse:  83  Resp: 13 14  Temp:  97.9 F (36.6 C)   Vitals:   02/07/16 0001 02/07/16 0144 02/07/16 0412 02/07/16 0429  BP:  (!) 145/81  138/72  Pulse:  (!) 110  83  Resp: 18 16 13 14   Temp:  98.2 F (36.8 C)  97.9 F (36.6 C)  TempSrc:  Oral  Rectal  SpO2: 98% 92% 95% 94%  Weight:  83.3 kg (183 lb 10.3 oz)    Height:        General appearance : Awake, alert, not in any distress. Speech Clear. Not toxic looking HEENT: Atraumatic and Normocephalic, pupils equally reactive to light and accomodation Neck:  Supple, no JVD. No cervical lymphadenopathy.  Chest: Good air entry bilaterally, no added sounds  CVS: S1 S2 regular, systolic murmurs, loud P2  Abdomen: Bowel sounds present, Non tender and not distended with no gaurding, rigidity or rebound. Extremities: B/L Lower Ext shows no edema, both legs are warm to touch Neurology: Awake alert, and oriented X 3, CN II-XII intact, Non focal Skin: No Rash  Discharge Instructions  Discharge Instructions    Diet - low sodium heart healthy    Complete by:  As directed    Increase activity slowly    Complete by:  As directed      Allergies as of 02/07/2016   No Known Allergies     Medication List    TAKE  these medications   ambrisentan 5 MG tablet Commonly known as:  LETAIRIS Take 5 mg by mouth at bedtime.   aspirin 81 MG chewable tablet Chew 1 tablet (81 mg total) by mouth daily.   cholecalciferol 1000 units tablet Commonly known as:  VITAMIN D Take 2,000 Units by mouth daily.   folic acid 1 MG tablet Commonly known as:  FOLVITE Take 1 mg by mouth daily.   gabapentin 300 MG capsule Commonly known as:  NEURONTIN Take 1 capsule (300 mg total) by mouth 3 (three) times daily.   HYDROmorphone 4 MG tablet Commonly known as:  DILAUDID Take 1 tablet (4 mg total) by mouth every 4 (four) hours as needed for moderate pain or severe pain.   hydroxyurea 500 MG capsule Commonly known as:  HYDREA Take 2 capsules (1,000 mg total) by mouth daily. May take with food to minimize GI side effects. Hold Hydrea until evaluated by Primary Provider or Hematologist.   JADENU 360 MG Tabs Generic drug:  Deferasirox Take 1,080 mg by mouth daily.   lisinopril 5 MG tablet Commonly known as:  PRINIVIL,ZESTRIL Take 1 tablet (5 mg total) by mouth daily.   morphine 30 MG 12 hr tablet Commonly known as:  MS CONTIN Take 1 tablet (30 mg total) by mouth every 12 (twelve) hours.   potassium chloride SA 20 MEQ tablet Commonly known as:  K-DUR,KLOR-CON Take 2 tablets (40 mEq total) by mouth daily.   rivaroxaban 20 MG Tabs tablet Commonly known as:  XARELTO Take 1 tablet (20 mg total) by mouth at bedtime.   torsemide 20 MG tablet Commonly known as:  DEMADEX Take 20 mg by mouth daily as needed (for weight gain greater than 3lbs.).   zolpidem 10 MG tablet Commonly known as:  AMBIEN Take 1 tablet (10 mg total) by mouth at bedtime as needed for sleep.        The results of significant diagnostics from this hospitalization (including imaging, microbiology, ancillary and laboratory) are listed below for reference.    Significant Diagnostic Studies: Dg Chest 2 View  Result Date: 01/31/2016 CLINICAL  DATA:  Bilateral rib pain. History of hypertension, chronic pulmonary embolism and sickle cell anemia. EXAM: CHEST  2 VIEW COMPARISON:  Chest radiograph January 27, 2016 and CT chest January 05, 2016 FINDINGS: Cardiac silhouette is moderately enlarged and unchanged. Mediastinal silhouette is nonsuspicious. Pulmonary vascular congestion without pleural effusion or focal consolidation. RIGHT mid and lower lung zone atelectasis/scarring. No pneumothorax. Single lumen LEFT chest Port-A-Cath. Surgical clips in the included right abdomen compatible with cholecystectomy. Osseous structures are nonacute. Focal sclerosis in the humeral head compatible with infarcts. IMPRESSION: Stable cardiomegaly and pulmonary vascular congestion. RIGHT atelectasis/scarring. Electronically Signed   By: Elon Alas  M.D.   On: 01/31/2016 04:46    Microbiology: No results found for this or any previous visit (from the past 240 hour(s)).   Labs: Basic Metabolic Panel:  Recent Labs Lab 02/01/16 0535 02/02/16 0552 02/04/16 0853 02/06/16 0529 02/07/16 0502  NA 139 142 142 142 140  K 4.4 4.5 4.9 4.8 4.9  CL 110 115* 112* 112* 111  CO2 23 23 24 26 25   GLUCOSE 121* 110* 121* 107* 131*  BUN 19 17 22* 17 13  CREATININE 0.87 0.79 0.86 0.96 0.87  CALCIUM 8.7* 8.6* 8.7* 8.5* 8.8*   Liver Function Tests:  Recent Labs Lab 02/01/16 0535 02/02/16 0552 02/07/16 0502  AST 33 40 43*  ALT 25 29 25   ALKPHOS 89 96 109  BILITOT 4.3* 4.3* 4.0*  PROT 7.7 7.5 6.9  ALBUMIN 4.5 4.1 3.8   No results for input(s): LIPASE, AMYLASE in the last 168 hours. No results for input(s): AMMONIA in the last 168 hours. CBC:  Recent Labs Lab 02/01/16 0535 02/02/16 0552 02/04/16 0853 02/06/16 0529 02/07/16 0502  WBC 16.5* 15.8* 15.3* 10.5 9.6  NEUTROABS 11.7* 11.2* 10.6* 6.2 5.0  HGB 8.1* 7.6* 7.4* 6.3* 7.3*  HCT 23.5* 22.1* 21.0* 18.1* 21.0*  MCV 90.7 90.6 89.4 89.2 87.9  PLT 532* 482* 451* 402* 394   Cardiac Enzymes: No  results for input(s): CKTOTAL, CKMB, CKMBINDEX, TROPONINI in the last 168 hours. BNP: Invalid input(s): POCBNP CBG: No results for input(s): GLUCAP in the last 168 hours.  Time coordinating discharge: 50 minutes  Signed:  Marialuisa Basara, [place] [facility] 02/07/2016, 9:52 AM

## 2016-02-07 NOTE — Discharge Instructions (Signed)
Sickle Cell Anemia, Adult °Sickle cell anemia is a condition in which red blood cells have an abnormal “sickle” shape. This abnormal shape shortens the cells’ life span, which results in a lower than normal concentration of red blood cells in the blood. The sickle shape also causes the cells to clump together and block free blood flow through the blood vessels. As a result, the tissues and organs of the body do not receive enough oxygen. Sickle cell anemia causes organ damage and pain and increases the risk of infection. °What are the causes? °Sickle cell anemia is a genetic disorder. Those who receive two copies of the gene have the condition, and those who receive one copy have the trait. °What increases the risk? °The sickle cell gene is most common in people whose families originated in [place]. Other areas of the globe where sickle cell trait occurs include the Mediterranean, [place] and [place], the [place], and the [place]. °What are the signs or symptoms? °· Pain, especially in the extremities, back, chest, or abdomen (common). The pain may start suddenly or may develop following an illness, especially if there is dehydration. Pain can also occur due to overexertion or exposure to extreme temperature changes. °· Frequent severe bacterial infections, especially certain types of pneumonia and meningitis. °· Pain and swelling in the hands and feet. °· Decreased activity. °· Loss of appetite. °· Change in behavior. °· Headaches. °· Seizures. °· Shortness of breath or difficulty breathing. °· Vision changes. °· Skin ulcers. °Those with the trait may not have symptoms or they may have mild symptoms. °How is this diagnosed? °Sickle cell anemia is diagnosed with blood tests that demonstrate the genetic trait. It is often diagnosed during the newborn period, due to mandatory testing nationwide. A variety of blood tests, X-rays, CT scans, MRI scans, ultrasounds, and lung function tests may also be done to  monitor the condition. °How is this treated? °Sickle cell anemia may be treated with: °· Medicines. You may be given pain medicines, antibiotic medicines (to treat and prevent infections) or medicines to increase the production of certain types of hemoglobin. °· Fluids. °· Oxygen. °· Blood transfusions. ° °Follow these instructions at home: °· Drink enough fluid to keep your urine clear or pale yellow. Increase your fluid intake in hot weather and during exercise. °· Do not smoke. Smoking lowers oxygen levels in the blood. °· Only take over-the-counter or prescription medicines for pain, fever, or discomfort as directed by your health care provider. °· Take antibiotics as directed by your health care provider. Make sure you finish them it even if you start to feel better. °· Take supplements as directed by your health care provider. °· Consider wearing a medical alert bracelet. This tells anyone caring for you in an emergency of your condition. °· When traveling, keep your medical information, health care provider's names, and the medicines you take with you at all times. °· If you develop a fever, do not take medicines to reduce the fever right away. This could cover up a problem that is developing. Notify your health care provider. °· Keep all follow-up appointments with your health care provider. Sickle cell anemia requires regular medical care. °Contact a health care provider if: °You have a fever. °Get help right away if: °· You feel dizzy or faint. °· You have new abdominal pain, especially on the left side near the stomach area. °· You develop a persistent, often uncomfortable and painful penile erection (priapism). If this is not   treated immediately it will lead to impotence. °· You have numbness your arms or legs or you have a hard time moving them. °· You have a hard time with speech. °· You have a fever or persistent symptoms for more than 2-3 days. °· You have a fever and your symptoms suddenly get  worse. °· You have signs or symptoms of infection. These include: °? Chills. °? Abnormal tiredness (lethargy). °? Irritability. °? Poor eating. °? Vomiting. °· You develop pain that is not helped with medicine. °· You develop shortness of breath. °· You have pain in your chest. °· You are coughing up pus-like or bloody sputum. °· You develop a stiff neck. °· Your feet or hands swell or have pain. °· Your abdomen appears bloated. °· You develop joint pain. °This information is not intended to replace advice given to you by your health care provider. Make sure you discuss any questions you have with your health care provider. °Document Released: 04/27/2005 Document Revised: 08/07/2015 Document Reviewed: 08/29/2012 °Elsevier Interactive Patient Education © 2017 [employer] ° °

## 2016-02-07 NOTE — Progress Notes (Signed)
Patient discharged to home, all discharge medications and instructions reviewed and questions answered.  Patient declines wheelchair assistance to vehicle by wheelchair, states will ambulate.

## 2016-02-08 LAB — TYPE AND SCREEN
Blood Product Expiration Date: 201801212359
ISSUE DATE / TIME: 201801061701
Unit Type and Rh: 5100

## 2016-02-09 ENCOUNTER — Encounter (HOSPITAL_COMMUNITY): Payer: Self-pay | Admitting: Hematology

## 2016-02-09 ENCOUNTER — Non-Acute Institutional Stay (HOSPITAL_BASED_OUTPATIENT_CLINIC_OR_DEPARTMENT_OTHER)
Admission: AD | Admit: 2016-02-09 | Discharge: 2016-02-09 | Disposition: A | Discharge status: Home / Self Care | Payer: Medicare Other | Source: Ambulatory Visit | Attending: Internal Medicine | Admitting: Internal Medicine

## 2016-02-09 ENCOUNTER — Ambulatory Visit (INDEPENDENT_AMBULATORY_CARE_PROVIDER_SITE_OTHER): Payer: Medicare Other | Admitting: Internal Medicine

## 2016-02-09 VITALS — BP 101/66 | HR 87 | Temp 98.4°F | Resp 20 | Ht 72.0 in | Wt 180.4 lb

## 2016-02-09 DIAGNOSIS — G894 Chronic pain syndrome: Secondary | ICD-10-CM | POA: Diagnosis present

## 2016-02-09 DIAGNOSIS — I10 Essential (primary) hypertension: Secondary | ICD-10-CM

## 2016-02-09 DIAGNOSIS — Z79891 Long term (current) use of opiate analgesic: Secondary | ICD-10-CM

## 2016-02-09 DIAGNOSIS — Z86711 Personal history of pulmonary embolism: Secondary | ICD-10-CM

## 2016-02-09 DIAGNOSIS — I2721 Secondary pulmonary arterial hypertension: Secondary | ICD-10-CM | POA: Diagnosis not present

## 2016-02-09 DIAGNOSIS — R790 Abnormal level of blood mineral: Secondary | ICD-10-CM | POA: Diagnosis not present

## 2016-02-09 DIAGNOSIS — R9431 Abnormal electrocardiogram [ECG] [EKG]: Secondary | ICD-10-CM | POA: Diagnosis not present

## 2016-02-09 DIAGNOSIS — Z7901 Long term (current) use of anticoagulants: Secondary | ICD-10-CM | POA: Insufficient documentation

## 2016-02-09 DIAGNOSIS — Z87891 Personal history of nicotine dependence: Secondary | ICD-10-CM

## 2016-02-09 DIAGNOSIS — N19 Unspecified kidney failure: Secondary | ICD-10-CM | POA: Diagnosis not present

## 2016-02-09 DIAGNOSIS — Z7982 Long term (current) use of aspirin: Secondary | ICD-10-CM

## 2016-02-09 DIAGNOSIS — F5101 Primary insomnia: Secondary | ICD-10-CM

## 2016-02-09 DIAGNOSIS — D57819 Other sickle-cell disorders with crisis, unspecified: Secondary | ICD-10-CM

## 2016-02-09 DIAGNOSIS — Z96641 Presence of right artificial hip joint: Secondary | ICD-10-CM | POA: Diagnosis present

## 2016-02-09 DIAGNOSIS — R0902 Hypoxemia: Secondary | ICD-10-CM | POA: Diagnosis not present

## 2016-02-09 DIAGNOSIS — D72829 Elevated white blood cell count, unspecified: Secondary | ICD-10-CM | POA: Diagnosis present

## 2016-02-09 DIAGNOSIS — R109 Unspecified abdominal pain: Secondary | ICD-10-CM | POA: Diagnosis not present

## 2016-02-09 DIAGNOSIS — E860 Dehydration: Secondary | ICD-10-CM | POA: Diagnosis present

## 2016-02-09 DIAGNOSIS — Z9114 Patient's other noncompliance with medication regimen: Secondary | ICD-10-CM

## 2016-02-09 DIAGNOSIS — I272 Pulmonary hypertension, unspecified: Secondary | ICD-10-CM

## 2016-02-09 DIAGNOSIS — N179 Acute kidney failure, unspecified: Principal | ICD-10-CM | POA: Diagnosis present

## 2016-02-09 DIAGNOSIS — Z9981 Dependence on supplemental oxygen: Secondary | ICD-10-CM | POA: Diagnosis not present

## 2016-02-09 DIAGNOSIS — D5700 Hb-SS disease with crisis, unspecified: Secondary | ICD-10-CM | POA: Diagnosis present

## 2016-02-09 DIAGNOSIS — Z8673 Personal history of transient ischemic attack (TIA), and cerebral infarction without residual deficits: Secondary | ICD-10-CM

## 2016-02-09 DIAGNOSIS — Z79899 Other long term (current) drug therapy: Secondary | ICD-10-CM | POA: Insufficient documentation

## 2016-02-09 DIAGNOSIS — E875 Hyperkalemia: Secondary | ICD-10-CM | POA: Diagnosis not present

## 2016-02-09 DIAGNOSIS — I959 Hypotension, unspecified: Secondary | ICD-10-CM | POA: Diagnosis present

## 2016-02-09 DIAGNOSIS — J9611 Chronic respiratory failure with hypoxia: Secondary | ICD-10-CM | POA: Diagnosis present

## 2016-02-09 DIAGNOSIS — D638 Anemia in other chronic diseases classified elsewhere: Secondary | ICD-10-CM | POA: Diagnosis present

## 2016-02-09 MED ORDER — HEPARIN SOD (PORK) LOCK FLUSH 100 UNIT/ML IV SOLN
500.0000 [IU] | INTRAVENOUS | Status: AC | PRN
  Administered 2016-02-09: 500 [IU]
  Filled 2016-02-09: qty 5

## 2016-02-09 MED ORDER — SODIUM CHLORIDE 0.9 % IV SOLN
INTRAVENOUS | Status: DC
  Administered 2016-02-09: 09:00:00 via INTRAVENOUS

## 2016-02-09 MED ORDER — SODIUM CHLORIDE 0.9% FLUSH
10.0000 mL | INTRAVENOUS | Status: AC | PRN
  Administered 2016-02-09: 10 mL

## 2016-02-09 MED ORDER — MORPHINE SULFATE ER 30 MG PO TBCR: 30.0000 mg | EXTENDED_RELEASE_TABLET | Freq: Two times a day (BID) | ORAL | 0 refills | Status: DC

## 2016-02-09 MED ORDER — ZOLPIDEM TARTRATE 10 MG PO TABS: 10.0000 mg | ORAL_TABLET | Freq: Every evening | ORAL | 0 refills | Status: DC | PRN

## 2016-02-09 MED ORDER — HYDROMORPHONE HCL 2 MG/ML IJ SOLN
2.0000 mg | INTRAMUSCULAR | Status: AC
  Administered 2016-02-09 (×2): 2 mg via INTRAVENOUS
  Filled 2016-02-09 (×2): qty 1

## 2016-02-09 NOTE — Progress Notes (Signed)
Pt received to [facility] for treatment. He states his pain is in his ankles.Pt was treated with Dilaudid 2mg  pushes times 2. He stated his pain was 7 on admission and down to 6 at discharge. Pt also received his prescriptions for MS contin and Ambien. Discharge instructions given to him with verbal understanding. Pt was alert,oriented and ambulatory at discharge.

## 2016-02-09 NOTE — H&P (Signed)
[facility] History and Physical  [name] [id number] DOB: Jun 19, 1980 DOA: 02/09/2016  PCP: [name], MD   Chief Complaint:  Chief Complaint  Patient presents with  . Sickle Cell Pain Crisis    HPI: [name] is a 36 y.o. male with history of sickle cell disease with multiple complications as listed below including respiratory failure on home oxygen and recent CVA on Xarelto was seen here today for a hospital discharge follow up visit. Patient was recently admitted to the hospital for sickle cell pain crisis, his Hb dropped to 6.3, was transfused one unit of PRBC. Patient is to make appointment with [facility] for evaluation for portable oxygen because he now needs mobile oxygen tank. He is complaining of bilateral ankle pain today which started 2 days ago. Chest and rib pains have resolved. He denies any SOB or headache. He sleeps well, eats well, no N/V/D. His ankle pain is at 7/10, throbbing and achy. He request some parenteral pain management at the day hospital.  Systemic Review: General: The patient denies anorexia, fever, weight loss Cardiac: Denies chest pain, syncope, palpitations, pedal edema  Respiratory: Denies cough, shortness of breath, wheezing GI: Denies severe indigestion/heartburn, abdominal pain, nausea, vomiting, diarrhea and constipation GU: Denies hematuria, incontinence, dysuria  Skin: Denies suspicious skin lesions Neurologic: Denies focal weakness or numbness, change in vision  Past Medical History:  Diagnosis Date  . Acute chest syndrome ([place]) 06/18/2013  . Acute embolism and thrombosis of right internal jugular vein ([place])   . Alcohol consumption of one to four drinks per day   . Avascular necrosis (HCC)    Right Hip  . Blood transfusion   . Chronic anticoagulation   . Demand ischemia ([place]) 01/02/2014  . Former smoker   . Functional asplenia   . Hb-SS disease with crisis ([place])   . History of Clostridium difficile  infection   . History of pulmonary embolus (PE)   . Hypertension   . Hypokalemia   . Leukocytosis    Chronic  . Mood disorder ([place])   . Noncompliance with medication regimen   . Oxygen deficiency   . Pulmonary hypertension   . Second hand tobacco smoke exposure   . Sickle cell anemia (HCC)   . Sickle-cell crisis with associated acute chest syndrome ([place]) 05/13/2013  . Stroke ([place])   . Thrombocytosis (HCC)    Chronic  . Uses marijuana     Past Surgical History:  Procedure Laterality Date  . CHOLECYSTECTOMY     01/2008  . Excision of left periauricular cyst     10/2009  . Excision of right ear lobe cyst with primary closur     11/2007  . Porta cath placement    . Porta cath removal    . PORTACATH PLACEMENT  01/05/2012   Procedure: INSERTION PORT-A-CATH;  Surgeon: Odis Hollingshead, MD;  Location: [place];  Service: General;  Laterality: N/A;  ultrasound guiced port a cath insertion with fluoroscopy  . Right hip replacement     08/2006  . UMBILICAL HERNIA REPAIR     01/2008    No Known Allergies  Family History  Problem Relation Age of Onset  . Sickle cell trait Mother   . Depression Mother   . Diabetes Mother   . Sickle cell trait Father   . Sickle cell trait Brother       Prior to Admission medications   Medication Sig Start Date End Date Taking? Authorizing Provider  ambrisentan (LETAIRIS) 5 MG tablet Take 5 mg by mouth at bedtime.   Yes Historical Provider, MD  aspirin 81 MG chewable tablet Chew 1 tablet (81 mg total) by mouth daily. 07/23/14  Yes [name], MD  cholecalciferol (VITAMIN D) 1000 units tablet Take 2,000 Units by mouth daily.   Yes Historical Provider, MD  Deferasirox (JADENU) 360 MG TABS Take 1,080 mg by mouth daily.    Yes Historical Provider, MD  folic acid (FOLVITE) 1 MG tablet Take 1 mg by mouth daily.   Yes Historical Provider, MD  gabapentin (NEURONTIN) 300 MG capsule Take 1 capsule (300 mg total) by mouth 3 (three) times daily. 06/01/15   Yes Tresa Garter, MD  HYDROmorphone (DILAUDID) 4 MG tablet Take 1 tablet (4 mg total) by mouth every 4 (four) hours as needed for moderate pain or severe pain. 02/04/16 02/19/16 Yes Imraan Wendell Essie Christine, MD  hydroxyurea (HYDREA) 500 MG capsule Take 2 capsules (1,000 mg total) by mouth daily. May take with food to minimize GI side effects. Hold Hydrea until evaluated by Primary Provider or Hematologist. 12/22/15  Yes [name], MD  lisinopril (PRINIVIL,ZESTRIL) 5 MG tablet Take 1 tablet (5 mg total) by mouth daily. 08/04/15  Yes [name], MD  potassium chloride SA (K-DUR,KLOR-CON) 20 MEQ tablet Take 2 tablets (40 mEq total) by mouth daily. 12/23/15  Yes [name], MD  rivaroxaban (XARELTO) 20 MG TABS tablet Take 1 tablet (20 mg total) by mouth at bedtime. 12/29/15  Yes Tresa Garter, MD  torsemide (DEMADEX) 20 MG tablet Take 20 mg by mouth daily as needed (for weight gain greater than 3lbs.).    Yes Historical Provider, MD  morphine (MS CONTIN) 30 MG 12 hr tablet Take 1 tablet (30 mg total) by mouth every 12 (twelve) hours. 02/09/16 03/10/16  Tresa Garter, MD  zolpidem (AMBIEN) 10 MG tablet Take 1 tablet (10 mg total) by mouth at bedtime as needed for sleep. 02/09/16 03/10/16  Tresa Garter, MD     Physical Exam: Vitals:   02/09/16 0900  BP: 99/67  Pulse: 78  Resp: 18  Temp: 98.6 F (37 C)  TempSrc: Oral  SpO2: 98%  Weight: 180 lb (81.6 kg)  Height: 6' (1.829 m)    General: Alert, awake, afebrile, anicteric, not in obvious distress HEENT: Normocephalic and Atraumatic, Mucous membranes pink                PERRLA; EOM intact; No scleral icterus,                 Nares: Patent, Oropharynx: Clear, Fair Dentition                 Neck: FROM, no cervical lymphadenopathy, thyromegaly, carotid bruit or JVD;  CHEST WALL: No tenderness  CHEST: Normal respiration, clear to auscultation bilaterally  HEART: Regular rate and rhythm; no murmurs rubs or  gallops  BACK: No kyphosis or scoliosis; no CVA tenderness  ABDOMEN: Positive Bowel Sounds, soft, non-tender; no masses, no organomegaly EXTREMITIES: No cyanosis, clubbing, or edema SKIN:  no rash or ulceration  CNS: Alert and Oriented x 4, Nonfocal exam, CN 2-12 intact  Labs on Admission:  Basic Metabolic Panel:  Recent Labs Lab 02/04/16 0853 02/06/16 0529 02/07/16 0502  NA 142 142 140  K 4.9 4.8 4.9  CL 112* 112* 111  CO2 24 26 25   GLUCOSE 121* 107* 131*  BUN 22* 17 13  CREATININE 0.86 0.96  0.87  CALCIUM 8.7* 8.5* 8.8*   Liver Function Tests:  Recent Labs Lab 02/07/16 0502  AST 43*  ALT 25  ALKPHOS 109  BILITOT 4.0*  PROT 6.9  ALBUMIN 3.8   No results for input(s): LIPASE, AMYLASE in the last 168 hours. No results for input(s): AMMONIA in the last 168 hours. CBC:  Recent Labs Lab 02/04/16 0853 02/06/16 0529 02/07/16 0502  WBC 15.3* 10.5 9.6  NEUTROABS 10.6* 6.2 5.0  HGB 7.4* 6.3* 7.3*  HCT 21.0* 18.1* 21.0*  MCV 89.4 89.2 87.9  PLT 451* 402* 394   Cardiac Enzymes: No results for input(s): CKTOTAL, CKMB, CKMBINDEX, TROPONINI in the last 168 hours.  BNP (last 3 results)  Recent Labs  08/02/15 2307 11/28/15 0520  BNP 503.2* 422.5*    ProBNP (last 3 results) No results for input(s): PROBNP in the last 8760 hours.  CBG: No results for input(s): GLUCAP in the last 168 hours.   Assessment/Plan Active Problems:   Sickle cell anemia with crisis ([place])   Admits to the Day Hospital  Rapid dosing Dilaudid IV per protocol  IV Toradol 30 mg  Monitor vitals very closely for 30 min - 1 hour  2 L of Oxygen by   Code Status: Full  Family Communication: None  DVT Prophylaxis: Ambulate as tolerated   Time spent: 78 Minutes  Gerald Grosso, MD, MHA, FACP, FAAP, CPE  If 7PM-7AM, please contact night-coverage [email or web address] 02/09/2016, 2:05 PM

## 2016-02-09 NOTE — Patient Instructions (Signed)
Sickle Cell Anemia, Adult °Sickle cell anemia is a condition in which red blood cells have an abnormal “sickle” shape. This abnormal shape shortens the cells’ life span, which results in a lower than normal concentration of red blood cells in the blood. The sickle shape also causes the cells to clump together and block free blood flow through the blood vessels. As a result, the tissues and organs of the body do not receive enough oxygen. Sickle cell anemia causes organ damage and pain and increases the risk of infection. °What are the causes? °Sickle cell anemia is a genetic disorder. Those who receive two copies of the gene have the condition, and those who receive one copy have the trait. °What increases the risk? °The sickle cell gene is most common in people whose families originated in [place]. Other areas of the globe where sickle cell trait occurs include the Mediterranean, [place] and [place], the [place], and the [place]. °What are the signs or symptoms? °· Pain, especially in the extremities, back, chest, or abdomen (common). The pain may start suddenly or may develop following an illness, especially if there is dehydration. Pain can also occur due to overexertion or exposure to extreme temperature changes. °· Frequent severe bacterial infections, especially certain types of pneumonia and meningitis. °· Pain and swelling in the hands and feet. °· Decreased activity. °· Loss of appetite. °· Change in behavior. °· Headaches. °· Seizures. °· Shortness of breath or difficulty breathing. °· Vision changes. °· Skin ulcers. °Those with the trait may not have symptoms or they may have mild symptoms. °How is this diagnosed? °Sickle cell anemia is diagnosed with blood tests that demonstrate the genetic trait. It is often diagnosed during the newborn period, due to mandatory testing nationwide. A variety of blood tests, X-rays, CT scans, MRI scans, ultrasounds, and lung function tests may also be done to  monitor the condition. °How is this treated? °Sickle cell anemia may be treated with: °· Medicines. You may be given pain medicines, antibiotic medicines (to treat and prevent infections) or medicines to increase the production of certain types of hemoglobin. °· Fluids. °· Oxygen. °· Blood transfusions. ° °Follow these instructions at home: °· Drink enough fluid to keep your urine clear or pale yellow. Increase your fluid intake in hot weather and during exercise. °· Do not smoke. Smoking lowers oxygen levels in the blood. °· Only take over-the-counter or prescription medicines for pain, fever, or discomfort as directed by your health care provider. °· Take antibiotics as directed by your health care provider. Make sure you finish them it even if you start to feel better. °· Take supplements as directed by your health care provider. °· Consider wearing a medical alert bracelet. This tells anyone caring for you in an emergency of your condition. °· When traveling, keep your medical information, health care provider's names, and the medicines you take with you at all times. °· If you develop a fever, do not take medicines to reduce the fever right away. This could cover up a problem that is developing. Notify your health care provider. °· Keep all follow-up appointments with your health care provider. Sickle cell anemia requires regular medical care. °Contact a health care provider if: °You have a fever. °Get help right away if: °· You feel dizzy or faint. °· You have new abdominal pain, especially on the left side near the stomach area. °· You develop a persistent, often uncomfortable and painful penile erection (priapism). If this is not   treated immediately it will lead to impotence. °· You have numbness your arms or legs or you have a hard time moving them. °· You have a hard time with speech. °· You have a fever or persistent symptoms for more than 2-3 days. °· You have a fever and your symptoms suddenly get  worse. °· You have signs or symptoms of infection. These include: °? Chills. °? Abnormal tiredness (lethargy). °? Irritability. °? Poor eating. °? Vomiting. °· You develop pain that is not helped with medicine. °· You develop shortness of breath. °· You have pain in your chest. °· You are coughing up pus-like or bloody sputum. °· You develop a stiff neck. °· Your feet or hands swell or have pain. °· Your abdomen appears bloated. °· You develop joint pain. °This information is not intended to replace advice given to you by your health care provider. Make sure you discuss any questions you have with your health care provider. °Document Released: 04/27/2005 Document Revised: 08/07/2015 Document Reviewed: 08/29/2012 °Elsevier Interactive Patient Education © 2017 [employer] ° °

## 2016-02-09 NOTE — Discharge Summary (Signed)
Physician Discharge Summary  [name] [id number] DOB: 06/17/1980 DOA: 02/09/2016  PCP: [name], MD  Admit date: 02/09/2016  Discharge date: 02/09/2016  Time spent: 30 minutes  Discharge Diagnoses:  Active Problems:   Sickle cell anemia with crisis [facility])   Discharge Condition: Stable  Diet recommendation: Regular  Filed Weights   02/09/16 0900  Weight: 180 lb (81.6 kg)    History of present illness:  [name] is a 36 y.o. male with history of sickle cell disease with multiple complications as listed below including respiratory failure on home oxygen and recent CVA on Xarelto was seen here today for a hospital discharge follow up visit. Patient was recently admitted to the hospital for sickle cell pain crisis, his Hb dropped to 6.3, was transfused one unit of PRBC. Patient is to [facility] with [facility] for evaluation for portable oxygen because he now needs mobile oxygen tank. He is complaining of bilateral ankle pain today which started 2 days ago. Chest and rib pains have resolved. He denies any SOB or headache. He sleeps well, eats well, no N/V/D. His ankle pain is at 7/10, throbbing and achy. He request some parenteral pain management at the day hospital.  Hospital Course:  ANAND CHARITY was admitted to the day hospital with sickle cell painful crisis. Patient was treated with rapid dosing IV dilaudid injections per protocol. Romelle showed significant improvement symptomatically, pain improved from 7 to 3/10 at the time of discharge. Patient was discharged home in a hemodynamically stable condition. Daxtyn will follow-up at the clinic as previously scheduled, continue with home medications as per prior to admission.  Discharge Instructions We discussed the need for good hydration, monitoring of hydration status, avoidance of heat, cold, stress, and infection triggers. We discussed the need to be compliant with taking Hydrea. Teofil was reminded of  the need to seek medical attention of any symptoms of bleeding, anemia, or infection occurs.  Discharge Exam: Vitals:   02/09/16 0900  BP: 99/67  Pulse: 78  Resp: 18  Temp: 98.6 F (37 C)   General appearance: alert, cooperative and no distress Eyes: conjunctivae/corneas clear. PERRL, EOM's intact. Fundi benign. Neck: no adenopathy, no carotid bruit, no JVD, supple, symmetrical, trachea midline and thyroid not enlarged, symmetric, no tenderness/mass/nodules Back: symmetric, no curvature. ROM normal. No CVA tenderness. Resp: clear to auscultation bilaterally Chest wall: no tenderness Cardio: regular rate and rhythm, S1, S2 normal, no murmur, click, rub or gallop GI: soft, non-tender; bowel sounds normal; no masses, no organomegaly Extremities: extremities normal, atraumatic, no cyanosis or edema Pulses: 2+ and symmetric Skin: Skin color, texture, turgor normal. No rashes or lesions Neurologic: Grossly normal  Current Discharge Medication List    START taking these medications   Details  morphine (MS CONTIN) 30 MG 12 hr tablet Take 1 tablet (30 mg total) by mouth every 12 (twelve) hours. Qty: 60 tablet, Refills: 0    zolpidem (AMBIEN) 10 MG tablet Take 1 tablet (10 mg total) by mouth at bedtime as needed for sleep. Qty: 30 tablet, Refills: 0   Associated Diagnoses: Primary insomnia      CONTINUE these medications which have NOT CHANGED   Details  ambrisentan (LETAIRIS) 5 MG tablet Take 5 mg by mouth at bedtime.    aspirin 81 MG chewable tablet Chew 1 tablet (81 mg total) by mouth daily. Qty: 30 tablet, Refills: 11    cholecalciferol (VITAMIN D) 1000 units tablet Take 2,000 Units by mouth daily.  Deferasirox (JADENU) 360 MG TABS Take 1,080 mg by mouth daily.     folic acid (FOLVITE) 1 MG tablet Take 1 mg by mouth daily.    gabapentin (NEURONTIN) 300 MG capsule Take 1 capsule (300 mg total) by mouth 3 (three) times daily. Qty: 270 capsule, Refills: 3     HYDROmorphone (DILAUDID) 4 MG tablet Take 1 tablet (4 mg total) by mouth every 4 (four) hours as needed for moderate pain or severe pain. Qty: 90 tablet, Refills: 0    hydroxyurea (HYDREA) 500 MG capsule Take 2 capsules (1,000 mg total) by mouth daily. May take with food to minimize GI side effects. Hold Hydrea until evaluated by Primary Provider or Hematologist.    lisinopril (PRINIVIL,ZESTRIL) 5 MG tablet Take 1 tablet (5 mg total) by mouth daily. Qty: 30 tablet, Refills: 1    potassium chloride SA (K-DUR,KLOR-CON) 20 MEQ tablet Take 2 tablets (40 mEq total) by mouth daily. Qty: 30 tablet, Refills: 1    rivaroxaban (XARELTO) 20 MG TABS tablet Take 1 tablet (20 mg total) by mouth at bedtime. Qty: 30 tablet, Refills: 3    torsemide (DEMADEX) 20 MG tablet Take 20 mg by mouth daily as needed (for weight gain greater than 3lbs.).        No Known Allergies  Significant Diagnostic Studies: Dg Chest 2 View  Result Date: 01/31/2016 CLINICAL DATA:  Bilateral rib pain. History of hypertension, chronic pulmonary embolism and sickle cell anemia. EXAM: CHEST  2 VIEW COMPARISON:  Chest radiograph January 27, 2016 and CT chest January 05, 2016 FINDINGS: Cardiac silhouette is moderately enlarged and unchanged. Mediastinal silhouette is nonsuspicious. Pulmonary vascular congestion without pleural effusion or focal consolidation. RIGHT mid and lower lung zone atelectasis/scarring. No pneumothorax. Single lumen LEFT chest Port-A-Cath. Surgical clips in the included right abdomen compatible with cholecystectomy. Osseous structures are nonacute. Focal sclerosis in the humeral head compatible with infarcts. IMPRESSION: Stable cardiomegaly and pulmonary vascular congestion. RIGHT atelectasis/scarring. Electronically Signed   By: Elon Alas M.D.   On: 01/31/2016 04:46    Signed:  [name] MD, MHA, [place], [place], CPE   02/09/2016, 2:08 PM

## 2016-02-09 NOTE — Progress Notes (Signed)
Patient is here for FU  Patient has not taken medication today, Patient has not eaten today,  Patient complains of bilateral ankle pain beginning two days ago.

## 2016-02-09 NOTE — Progress Notes (Signed)
ERROR

## 2016-02-09 NOTE — Progress Notes (Signed)
[name], is a 36 y.o. male  [id number]  [id number]  DOB - 1980/02/17  Chief Complaint  Patient presents with  . Follow-up       Subjective:   [name] is a 36 y.o. male with history of sickle cell disease with multiple complications as listed below including respiratory failure on home oxygen and recent CVA on Xarelto here today for a hospital discharge follow up visit. Patient was recently admitted to the hospital for sickle cell pain crisis, his Hb dropped to 6.3, was transfused one unit of PRBC. Patient is to make appointment with [facility] for evaluation for portable oxygen because he now needs mobile oxygen tank. He is complaining of bilateral ankle pain today which started 2 days ago. Chest and rib pains have resolved. He denies any SOB or headache. He sleeps well, eats well, no N/V/D.   Problem  Sickle Cell Anemia With Crisis (Hcc)    ALLERGIES: No Known Allergies  PAST MEDICAL HISTORY: Past Medical History:  Diagnosis Date  . Acute chest syndrome ([place]) 06/18/2013  . Acute embolism and thrombosis of right internal jugular vein ([place])   . Alcohol consumption of one to four drinks per day   . Avascular necrosis (HCC)    Right Hip  . Blood transfusion   . Chronic anticoagulation   . Demand ischemia ([place]) 01/02/2014  . Former smoker   . Functional asplenia   . Hb-SS disease with crisis ([place])   . History of Clostridium difficile infection   . History of pulmonary embolus (PE)   . Hypertension   . Hypokalemia   . Leukocytosis    Chronic  . Mood disorder ([place])   . Noncompliance with medication regimen   . Oxygen deficiency   . Pulmonary hypertension   . Second hand tobacco smoke exposure   . Sickle cell anemia (HCC)   . Sickle-cell crisis with associated acute chest syndrome ([place]) 05/13/2013  . Stroke ([place])   . Thrombocytosis (HCC)    Chronic  . Uses marijuana     MEDICATIONS AT HOME: Prior to Admission medications   Medication Sig Start Date  End Date Taking? Authorizing Provider  ambrisentan (LETAIRIS) 5 MG tablet Take 5 mg by mouth at bedtime.   Yes Historical Provider, MD  aspirin 81 MG chewable tablet Chew 1 tablet (81 mg total) by mouth daily. 07/23/14  Yes [name], MD  cholecalciferol (VITAMIN D) 1000 units tablet Take 2,000 Units by mouth daily.   Yes Historical Provider, MD  Deferasirox (JADENU) 360 MG TABS Take 1,080 mg by mouth daily.    Yes Historical Provider, MD  folic acid (FOLVITE) 1 MG tablet Take 1 mg by mouth daily.   Yes Historical Provider, MD  gabapentin (NEURONTIN) 300 MG capsule Take 1 capsule (300 mg total) by mouth 3 (three) times daily. 06/01/15  Yes Tresa Garter, MD  HYDROmorphone (DILAUDID) 4 MG tablet Take 1 tablet (4 mg total) by mouth every 4 (four) hours as needed for moderate pain or severe pain. 02/04/16 02/19/16 Yes Delaine Canter Essie Christine, MD  hydroxyurea (HYDREA) 500 MG capsule Take 2 capsules (1,000 mg total) by mouth daily. May take with food to minimize GI side effects. Hold Hydrea until evaluated by Primary Provider or Hematologist. 12/22/15  Yes [name], MD  lisinopril (PRINIVIL,ZESTRIL) 5 MG tablet Take 1 tablet (5 mg total) by mouth daily. 08/04/15  Yes [name], MD  potassium chloride SA (K-DUR,KLOR-CON) 20 MEQ tablet Take 2 tablets (  40 mEq total) by mouth daily. 12/23/15  Yes [name], MD  rivaroxaban (XARELTO) 20 MG TABS tablet Take 1 tablet (20 mg total) by mouth at bedtime. 12/29/15  Yes Tresa Garter, MD  torsemide (DEMADEX) 20 MG tablet Take 20 mg by mouth daily as needed (for weight gain greater than 3lbs.).    Yes Historical Provider, MD  morphine (MS CONTIN) 30 MG 12 hr tablet Take 1 tablet (30 mg total) by mouth every 12 (twelve) hours. 02/09/16 03/10/16  Tresa Garter, MD  zolpidem (AMBIEN) 10 MG tablet Take 1 tablet (10 mg total) by mouth at bedtime as needed for sleep. 02/09/16 03/10/16  Tresa Garter, MD    Objective:   Vitals:    02/09/16 0831  BP: 101/66  Pulse: 87  Resp: 20  Temp: 98.4 F (36.9 C)  TempSrc: Oral  SpO2: 100%  Weight: 180 lb 6.4 oz (81.8 kg)  Height: 6' (1.829 m)   Exam General appearance : Awake, alert, not in any distress. Speech Clear. Not toxic looking HEENT: Atraumatic and Normocephalic, pupils equally reactive to light and accomodation Neck: Supple, no JVD. No cervical lymphadenopathy.  Chest: Good air entry bilaterally, no added sounds  CVS: S1 S2 regular, syndrome murmurs.  Abdomen: Bowel sounds present, Non tender and not distended with no gaurding, rigidity or rebound. Extremities: B/L Lower Ext shows no edema, both legs are warm to touch Neurology: Awake alert, and oriented X 3, CN II-XII intact, Non focal Skin: No Rash  Data Review No results found for: HGBA1C  Assessment & Plan   1. Sickle-cell disease with pain [facility])  Transfer patient to the day hospital for observation and pain management with rapid dosing dilaudid per protocol.  Continue Hydrea. We discussed the need for good hydration, monitoring of hydration status, avoidance of heat, cold, stress, and infection triggers. We discussed the risks and benefits of Hydrea, including bone marrow suppression, the possibility of GI upset, skin ulcers, hair thinning, and teratogenicity. The patient was reminded of the need to seek medical attention of any symptoms of bleeding, anemia, or infection. Continue folic acid 1 mg daily to prevent aplastic bone marrow crises.  2. Essential hypertension  We have discussed target BP range and blood pressure goal. I have advised patient to check BP regularly and to call us back or report to clinic if the numbers are consistently higher than 140/90. We discussed the importance of compliance with medical therapy and DASH diet recommended, consequences of uncontrolled hypertension discussed.  - continue current BP medications  3. Chronic pain syndrome  We agreed on Opiate dose and amount  of pills  per month. We discussed that pt is to receive Schedule II prescriptions only from our clinic. Pt is also aware that the prescription history is available to [place] online through the [facility] CSRS. Controlled substance agreement reviewed and signed. We reminded [place] that all patients receiving Schedule II narcotics must be seen for follow within one month of prescription being requested. We reviewed the terms of our pain agreement, including the need to keep medicines in a safe locked location away from children or pets, and the need to report excess sedation or constipation, measures to avoid constipation, and policies related to early refills and stolen prescriptions. According to the [place] [facility] program, we have reviewed details related to analgesia, adverse effects and aberrant behaviors.  4. On home oxygen therapy  Will call [facility] today to make necessary arrangement for patient to  get his portable oxygen tank Continue home oxygen at all time  5. Primary insomnia  - zolpidem (AMBIEN) 10 MG tablet; Take 1 tablet (10 mg total) by mouth at bedtime as needed for sleep.  Dispense: 30 tablet; Refill: 0  Return in about 4 weeks (around 03/08/2016) for Sickle Cell Disease/Pain.  The patient was given clear instructions to go to ER or return to medical center if symptoms don't improve, worsen or new problems develop. The patient verbalized understanding. The patient was told to call to get lab results if they haven't heard anything in the next week.   This note has been created with Surveyor, quantity. Any transcriptional errors are unintentional.    [name], MD, [place], [place], [place], [place] and [place], [place]   02/09/2016, 9:20 AM

## 2016-02-11 ENCOUNTER — Inpatient Hospital Stay (HOSPITAL_COMMUNITY): Payer: Medicare Other

## 2016-02-11 ENCOUNTER — Encounter (HOSPITAL_COMMUNITY): Payer: Self-pay

## 2016-02-11 ENCOUNTER — Telehealth (HOSPITAL_COMMUNITY): Payer: Self-pay | Admitting: *Deleted

## 2016-02-11 ENCOUNTER — Inpatient Hospital Stay (HOSPITAL_COMMUNITY)
Admission: EM | Admit: 2016-02-11 | Discharge: 2016-02-16 | DRG: 682 | Disposition: A | Discharge status: Home / Self Care | Payer: Medicare Other | Attending: Internal Medicine | Admitting: Internal Medicine

## 2016-02-11 ENCOUNTER — Emergency Department (HOSPITAL_COMMUNITY): Payer: Medicare Other

## 2016-02-11 DIAGNOSIS — Z96641 Presence of right artificial hip joint: Secondary | ICD-10-CM | POA: Diagnosis present

## 2016-02-11 DIAGNOSIS — N186 End stage renal disease: Secondary | ICD-10-CM | POA: Diagnosis not present

## 2016-02-11 DIAGNOSIS — Z87891 Personal history of nicotine dependence: Secondary | ICD-10-CM | POA: Diagnosis not present

## 2016-02-11 DIAGNOSIS — N179 Acute kidney failure, unspecified: Secondary | ICD-10-CM | POA: Diagnosis not present

## 2016-02-11 DIAGNOSIS — Z9981 Dependence on supplemental oxygen: Secondary | ICD-10-CM | POA: Diagnosis not present

## 2016-02-11 DIAGNOSIS — J9611 Chronic respiratory failure with hypoxia: Secondary | ICD-10-CM | POA: Diagnosis present

## 2016-02-11 DIAGNOSIS — N19 Unspecified kidney failure: Secondary | ICD-10-CM | POA: Diagnosis not present

## 2016-02-11 DIAGNOSIS — R790 Abnormal level of blood mineral: Secondary | ICD-10-CM | POA: Diagnosis not present

## 2016-02-11 DIAGNOSIS — I10 Essential (primary) hypertension: Secondary | ICD-10-CM | POA: Diagnosis present

## 2016-02-11 DIAGNOSIS — E875 Hyperkalemia: Secondary | ICD-10-CM | POA: Diagnosis present

## 2016-02-11 DIAGNOSIS — D72829 Elevated white blood cell count, unspecified: Secondary | ICD-10-CM

## 2016-02-11 DIAGNOSIS — D638 Anemia in other chronic diseases classified elsewhere: Secondary | ICD-10-CM | POA: Diagnosis present

## 2016-02-11 DIAGNOSIS — I959 Hypotension, unspecified: Secondary | ICD-10-CM | POA: Diagnosis present

## 2016-02-11 DIAGNOSIS — E860 Dehydration: Secondary | ICD-10-CM

## 2016-02-11 DIAGNOSIS — G894 Chronic pain syndrome: Secondary | ICD-10-CM | POA: Diagnosis present

## 2016-02-11 DIAGNOSIS — R109 Unspecified abdominal pain: Secondary | ICD-10-CM | POA: Diagnosis not present

## 2016-02-11 DIAGNOSIS — J9621 Acute and chronic respiratory failure with hypoxia: Secondary | ICD-10-CM | POA: Diagnosis not present

## 2016-02-11 DIAGNOSIS — Z7901 Long term (current) use of anticoagulants: Secondary | ICD-10-CM

## 2016-02-11 DIAGNOSIS — Z8673 Personal history of transient ischemic attack (TIA), and cerebral infarction without residual deficits: Secondary | ICD-10-CM | POA: Diagnosis not present

## 2016-02-11 DIAGNOSIS — Z79891 Long term (current) use of opiate analgesic: Secondary | ICD-10-CM | POA: Diagnosis not present

## 2016-02-11 DIAGNOSIS — Z7982 Long term (current) use of aspirin: Secondary | ICD-10-CM | POA: Diagnosis not present

## 2016-02-11 DIAGNOSIS — D5700 Hb-SS disease with crisis, unspecified: Secondary | ICD-10-CM

## 2016-02-11 DIAGNOSIS — R0902 Hypoxemia: Secondary | ICD-10-CM | POA: Diagnosis not present

## 2016-02-11 DIAGNOSIS — E878 Other disorders of electrolyte and fluid balance, not elsewhere classified: Secondary | ICD-10-CM

## 2016-02-11 DIAGNOSIS — R9431 Abnormal electrocardiogram [ECG] [EKG]: Secondary | ICD-10-CM | POA: Diagnosis present

## 2016-02-11 DIAGNOSIS — I2721 Secondary pulmonary arterial hypertension: Secondary | ICD-10-CM | POA: Diagnosis present

## 2016-02-11 DIAGNOSIS — Z86711 Personal history of pulmonary embolism: Secondary | ICD-10-CM | POA: Diagnosis not present

## 2016-02-11 DIAGNOSIS — I272 Pulmonary hypertension, unspecified: Secondary | ICD-10-CM | POA: Diagnosis not present

## 2016-02-11 LAB — CBC WITH DIFFERENTIAL/PLATELET
BASOS ABS: 0 10*3/uL (ref 0.0–0.1)
Basophils Relative: 0 %
EOS ABS: 0 10*3/uL (ref 0.0–0.7)
Eosinophils Relative: 0 %
HCT: 21.8 % — ABNORMAL LOW (ref 39.0–52.0)
HEMOGLOBIN: 7.3 g/dL — AB (ref 13.0–17.0)
LYMPHS ABS: 1.7 10*3/uL (ref 0.7–4.0)
LYMPHS PCT: 7 %
MCH: 29 pg (ref 26.0–34.0)
MCHC: 33.5 g/dL (ref 30.0–36.0)
MCV: 86.5 fL (ref 78.0–100.0)
MONO ABS: 3.8 10*3/uL — AB (ref 0.1–1.0)
Monocytes Relative: 16 %
NEUTROS ABS: 18.4 10*3/uL — AB (ref 1.7–7.7)
Neutrophils Relative %: 77 %
Platelets: 503 10*3/uL — ABNORMAL HIGH (ref 150–400)
RBC: 2.52 MIL/uL — ABNORMAL LOW (ref 4.22–5.81)
RDW: 19.8 % — AB (ref 11.5–15.5)
WBC: 23.9 10*3/uL — ABNORMAL HIGH (ref 4.0–10.5)

## 2016-02-11 LAB — BLOOD GAS, VENOUS
ACID-BASE DEFICIT: 6 mmol/L — AB (ref 0.0–2.0)
Acid-base deficit: 6.5 mmol/L — ABNORMAL HIGH (ref 0.0–2.0)
BICARBONATE: 20.5 mmol/L (ref 20.0–28.0)
Bicarbonate: 19.7 mmol/L — ABNORMAL LOW (ref 20.0–28.0)
O2 SAT: 67.7 %
O2 Saturation: 50 %
PATIENT TEMPERATURE: 98.6
PCO2 VEN: 49.6 mmHg (ref 44.0–60.0)
PCO2 VEN: 45.9 mmHg (ref 44.0–60.0)
PH VEN: 7.24 — AB (ref 7.250–7.430)
Patient temperature: 98.6
pH, Ven: 7.256 (ref 7.250–7.430)
pO2, Ven: 31.3 mmHg — CL (ref 32.0–45.0)
pO2, Ven: 39.6 mmHg (ref 32.0–45.0)

## 2016-02-11 LAB — BASIC METABOLIC PANEL
ANION GAP: 9 (ref 5–15)
Anion gap: 11 (ref 5–15)
BUN: 55 mg/dL — AB (ref 6–20)
BUN: 59 mg/dL — AB (ref 6–20)
CALCIUM: 8.7 mg/dL — AB (ref 8.9–10.3)
CHLORIDE: 104 mmol/L (ref 101–111)
CO2: 20 mmol/L — ABNORMAL LOW (ref 22–32)
CO2: 21 mmol/L — ABNORMAL LOW (ref 22–32)
CREATININE: 7.18 mg/dL — AB (ref 0.61–1.24)
Calcium: 8.6 mg/dL — ABNORMAL LOW (ref 8.9–10.3)
Chloride: 102 mmol/L (ref 101–111)
Creatinine, Ser: 7.62 mg/dL — ABNORMAL HIGH (ref 0.61–1.24)
GFR calc Af Amer: 10 mL/min — ABNORMAL LOW (ref >60)
GFR calc Af Amer: 9 mL/min — ABNORMAL LOW (ref >60)
GFR, EST NON AFRICAN AMERICAN: 9 mL/min — AB (ref >60)
GFR, EST NON AFRICAN AMERICAN: 8 mL/min — AB (ref >60)
GLUCOSE: 146 mg/dL — AB (ref 65–99)
GLUCOSE: 139 mg/dL — AB (ref 65–99)
POTASSIUM: 5.3 mmol/L — AB (ref 3.5–5.1)
Potassium: 5.7 mmol/L — ABNORMAL HIGH (ref 3.5–5.1)
SODIUM: 133 mmol/L — AB (ref 135–145)
Sodium: 134 mmol/L — ABNORMAL LOW (ref 135–145)

## 2016-02-11 LAB — COMPREHENSIVE METABOLIC PANEL
ALBUMIN: 4.6 g/dL (ref 3.5–5.0)
ALK PHOS: 102 U/L (ref 38–126)
ALT: 25 U/L (ref 17–63)
ANION GAP: 12 (ref 5–15)
AST: 46 U/L — ABNORMAL HIGH (ref 15–41)
BUN: 50 mg/dL — ABNORMAL HIGH (ref 6–20)
CALCIUM: 8.8 mg/dL — AB (ref 8.9–10.3)
CO2: 19 mmol/L — AB (ref 22–32)
Chloride: 104 mmol/L (ref 101–111)
Creatinine, Ser: 7.2 mg/dL — ABNORMAL HIGH (ref 0.61–1.24)
GFR calc non Af Amer: 9 mL/min — ABNORMAL LOW (ref >60)
GFR, EST AFRICAN AMERICAN: 10 mL/min — AB (ref >60)
Glucose, Bld: 153 mg/dL — ABNORMAL HIGH (ref 65–99)
POTASSIUM: 5.2 mmol/L — AB (ref 3.5–5.1)
SODIUM: 135 mmol/L (ref 135–145)
TOTAL PROTEIN: 8 g/dL (ref 6.5–8.1)
Total Bilirubin: 5 mg/dL — ABNORMAL HIGH (ref 0.3–1.2)

## 2016-02-11 LAB — I-STAT TROPONIN, ED
TROPONIN I, POC: 0.05 ng/mL (ref 0.00–0.08)
Troponin i, poc: 0.05 ng/mL (ref 0.00–0.08)
Troponin i, poc: 0.05 ng/mL (ref 0.00–0.08)

## 2016-02-11 LAB — CREATININE, URINE, RANDOM: CREATININE, URINE: 316.69 mg/dL

## 2016-02-11 LAB — I-STAT CG4 LACTIC ACID, ED
LACTIC ACID, VENOUS: 0.95 mmol/L (ref 0.5–1.9)
Lactic Acid, Venous: 1.36 mmol/L (ref 0.5–1.9)

## 2016-02-11 LAB — NA AND K (SODIUM & POTASSIUM), RAND UR
POTASSIUM UR: 72 mmol/L
Sodium, Ur: 13 mmol/L

## 2016-02-11 LAB — HEPARIN LEVEL (UNFRACTIONATED): Heparin Unfractionated: 2.2 IU/mL — ABNORMAL HIGH (ref 0.30–0.70)

## 2016-02-11 LAB — MRSA PCR SCREENING: MRSA by PCR: NEGATIVE

## 2016-02-11 LAB — RETICULOCYTES
RBC.: 2.52 MIL/uL — AB (ref 4.22–5.81)
Retic Count, Absolute: 136.1 10*3/uL (ref 19.0–186.0)
Retic Ct Pct: 5.4 % — ABNORMAL HIGH (ref 0.4–3.1)

## 2016-02-11 LAB — APTT: aPTT: 44 seconds — ABNORMAL HIGH (ref 24–36)

## 2016-02-11 LAB — BRAIN NATRIURETIC PEPTIDE: B NATRIURETIC PEPTIDE 5: 381 pg/mL — AB (ref 0.0–100.0)

## 2016-02-11 MED ORDER — HYDROMORPHONE HCL 2 MG PO TABS: 2.0000 mg | ORAL_TABLET | Freq: Once | ORAL | Status: DC

## 2016-02-11 MED ORDER — HEPARIN (PORCINE) IN NACL 100-0.45 UNIT/ML-% IJ SOLN
1150.0000 [IU]/h | INTRAMUSCULAR | Status: DC
  Administered 2016-02-11: 1150 [IU]/h via INTRAVENOUS
  Filled 2016-02-11 (×2): qty 250

## 2016-02-11 MED ORDER — PIPERACILLIN-TAZOBACTAM IN DEX 2-0.25 GM/50ML IV SOLN
2.2500 g | Freq: Four times a day (QID) | INTRAVENOUS | Status: DC
  Filled 2016-02-11: qty 50

## 2016-02-11 MED ORDER — VANCOMYCIN HCL IN DEXTROSE 1-5 GM/200ML-% IV SOLN: 1000.0000 mg | INTRAVENOUS | Status: DC

## 2016-02-11 MED ORDER — SODIUM CHLORIDE 0.9 % IV SOLN
Freq: Once | INTRAVENOUS | Status: AC
  Administered 2016-02-11: 12:00:00 via INTRAVENOUS

## 2016-02-11 MED ORDER — VANCOMYCIN HCL 10 G IV SOLR
1500.0000 mg | Freq: Once | INTRAVENOUS | Status: DC
  Filled 2016-02-11: qty 1500

## 2016-02-11 MED ORDER — DEXTROSE-NACL 5-0.45 % IV SOLN
INTRAVENOUS | Status: DC
  Administered 2016-02-11: 11:00:00 via INTRAVENOUS

## 2016-02-11 MED ORDER — DEXTROSE 5 % IV BOLUS: 1000.0000 mL | Freq: Once | INTRAVENOUS | Status: DC

## 2016-02-11 NOTE — Telephone Encounter (Signed)
Pt requesting to come to the [facility] for treatment. Pt states his pain is in his sides and back. States his pain is 9/10. He last took his pain meds at 6:30am, Dilaudid and MS contin. He denies, fever, chest pain, shortness of breath, vomiting,diarrhea abdominal pain or priapism. States he is a little nauseated.  Pt Placed on hold while checking with the provider to see if he can come in for treatment.   After speaking with the provider, pt was told to stay home hydrated and continue to take his meds around the clock. Pt stated that he will be going to the ER.

## 2016-02-11 NOTE — ED Notes (Signed)
Pt has urinal at bedside. Was not able to provide sample.

## 2016-02-11 NOTE — ED Notes (Signed)
Pt is very hard to assess.  He continually falls asleep during assessment.

## 2016-02-11 NOTE — ED Notes (Signed)
Pt is more alert and speech is not severely slurred.

## 2016-02-11 NOTE — ED Notes (Signed)
Patient is aware we need urine.  He refuses to have [place] in and out

## 2016-02-11 NOTE — ED Notes (Signed)
Pt is now lethargic, again.  Responds to voice.  Pt made aware that he will be going to his room assignment.  No complaints at this time.

## 2016-02-11 NOTE — ED Notes (Signed)
Pt is resting with eyes closed, symmetrical rise and fall of chest noted.  Saliva is noted dripping from pt's mouth.  Pt responds to voice.

## 2016-02-11 NOTE — ED Triage Notes (Signed)
Patient presented with sickle cell pain ribs and back. Patient lethargic. Unable to pronounce words clearly and very lethargic while in triage. Patient states he took pain meds this AM at 0600. Dilaudid 4 mg Ms Contin 30 mg.

## 2016-02-11 NOTE — ED Notes (Signed)
Patient transported to Ultrasound 

## 2016-02-11 NOTE — ED Provider Notes (Addendum)
[facility] DEPT Provider Note   CSN: [id number] Arrival date & time: 02/11/16  [id number]     History   Chief Complaint Chief Complaint  Patient presents with  . Sickle Cell Pain Crisis    HPI [name] is a 36 y.o. male.  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 Patient was just hospitalized and discharged 2 days ago. States that since then, he has felt well until earlier this morning. He states he awoke around 2:58 AM with bilateral aching, throbbing, flank pain. He took his home pain medications without significant improvement. He has also had a mild cough but denies any shortness of breath. He uses oxygen as needed at home and states he has been using it constantly since the pain began without significant improvement. Denies any fevers. Denies any chest pain. Denies any abdominal pain, nausea, or vomiting.  Past Medical History:  Diagnosis Date  . Acute chest syndrome ([place]) 06/18/2013  . Acute embolism and thrombosis of right internal jugular vein ([place])   . Alcohol consumption of one to four drinks per day   . Avascular necrosis (HCC)    Right Hip  . Blood transfusion   . Chronic anticoagulation   . Demand ischemia ([place]) 01/02/2014  . Former smoker   . Functional asplenia   . Hb-SS disease with crisis ([place])   . History of Clostridium difficile infection   . History of pulmonary embolus (PE)   . Hypertension   . Hypokalemia   . Leukocytosis    Chronic  . Mood disorder ([place])   . Noncompliance with medication regimen   . Oxygen deficiency   . Pulmonary hypertension   . Second hand tobacco smoke exposure   . Sickle cell anemia (HCC)   . Sickle-cell crisis with associated acute chest syndrome ([place]) 05/13/2013  . Stroke ([place])   . Thrombocytosis (HCC)    Chronic  . Uses marijuana     Patient Active Problem List   Diagnosis Date Noted  . Acute renal failure (ARF) ([place])  02/11/2016  . Flank pain 01/16/2016  . Abdominal pain   . Colitis 12/15/2015  . Slurred speech 11/10/2015  . Pulmonary emboli ([place]) 11/10/2015  . Aphasia   . Acute chest syndrome ([employer])   . Ischemic stroke ([place])   . Sickle cell crisis ([place]) 10/27/2015  . Sickle cell pain crisis ([place]) 09/18/2015  . Sickle cell anemia with crisis ([place]) 09/04/2015  . Shortness of breath   . Pulmonary embolism without acute cor pulmonale ([place])   . Fever   . Sickle cell anemia with pain ([place]) 05/07/2015  . Acute kidney injury ([place])   . Hypoxia   . Anemia 01/31/2015  . Chronic anemia   . Hb-SS disease without crisis ([place]) 10/07/2014  . Acute on chronic respiratory failure with hypoxia ([place])   . PVC's (premature ventricular contractions) 09/10/2014  . Abnormal EKG 09/10/2014  . Chronic atrial fibrillation ([place]) 09/09/2014  . Cor pulmonale, chronic ([place]) 08/25/2014  . Sickle cell anemia ([place]) 06/25/2014  . Anemia of chronic disease 06/25/2014  . PAH (pulmonary artery hypertension) 03/18/2014  . Hb-SS disease with crisis ([place]) 01/22/2014  . Pulmonary embolus ([place]) 01/01/2014  . Paralytic strabismus, external ophthalmoplegia   . Chronic pain syndrome 12/12/2013  . Elevated troponin 11/26/2013  . Chronic anticoagulation 08/22/2013  . Essential hypertension 08/22/2013  . Pulmonary HTN 06/18/2013  . Functional asplenia   . Vitamin  D deficiency 02/13/2013  . Hx of pulmonary embolus 06/29/2012  . Hemochromatosis 12/14/2011  . Avascular necrosis [facility])     Past Surgical History:  Procedure Laterality Date  . CHOLECYSTECTOMY     01/2008  . Excision of left periauricular cyst     10/2009  . Excision of right ear lobe cyst with primary closur     11/2007  . Porta cath placement    . Porta cath removal    . PORTACATH PLACEMENT  01/05/2012   Procedure: INSERTION PORT-A-CATH;  Surgeon: Odis Hollingshead, MD;  Location: [facility];  Service: General;  Laterality: N/A;  ultrasound guiced port a cath insertion  with fluoroscopy  . Right hip replacement     08/2006  . UMBILICAL HERNIA REPAIR     01/2008       Home Medications    Prior to Admission medications   Medication Sig Start Date End Date Taking? Authorizing Provider  ambrisentan (LETAIRIS) 5 MG tablet Take 5 mg by mouth at bedtime.   Yes Historical Provider, MD  aspirin EC 81 MG tablet Take 81 mg by mouth daily.   Yes Historical Provider, MD  cholecalciferol (VITAMIN D) 1000 units tablet Take 2,000 Units by mouth daily.   Yes Historical Provider, MD  Deferasirox (JADENU) 360 MG TABS Take 1,080 mg by mouth daily.    Yes Historical Provider, MD  folic acid (FOLVITE) 1 MG tablet Take 1 mg by mouth daily.   Yes Historical Provider, MD  gabapentin (NEURONTIN) 300 MG capsule Take 1 capsule (300 mg total) by mouth 3 (three) times daily. 06/01/15  Yes Tresa Garter, MD  HYDROmorphone (DILAUDID) 4 MG tablet Take 1 tablet (4 mg total) by mouth every 4 (four) hours as needed for moderate pain or severe pain. 02/04/16 02/19/16 Yes Olugbemiga Essie Christine, MD  hydroxyurea (HYDREA) 500 MG capsule Take 2 capsules (1,000 mg total) by mouth daily. May take with food to minimize GI side effects. Hold Hydrea until evaluated by Primary Provider or Hematologist. 12/22/15  Yes [name], MD  lisinopril (PRINIVIL,ZESTRIL) 5 MG tablet Take 1 tablet (5 mg total) by mouth daily. Patient taking differently: Take 5 mg by mouth every evening.  08/04/15  Yes [name], MD  morphine (MS CONTIN) 30 MG 12 hr tablet Take 1 tablet (30 mg total) by mouth every 12 (twelve) hours. 02/09/16 03/10/16 Yes Tresa Garter, MD  potassium chloride SA (K-DUR,KLOR-CON) 20 MEQ tablet Take 2 tablets (40 mEq total) by mouth daily. 12/23/15  Yes [name], MD  rivaroxaban (XARELTO) 20 MG TABS tablet Take 1 tablet (20 mg total) by mouth at bedtime. 12/29/15  Yes Tresa Garter, MD  torsemide (DEMADEX) 20 MG tablet Take 20 mg by mouth daily as needed (for weight  gain greater than 3lbs.).    Yes Historical Provider, MD  zolpidem (AMBIEN) 10 MG tablet Take 1 tablet (10 mg total) by mouth at bedtime as needed for sleep. 02/09/16 03/10/16 Yes Tresa Garter, MD  aspirin 81 MG chewable tablet Chew 1 tablet (81 mg total) by mouth daily. Patient not taking: Reported on 02/11/2016 07/23/14   [name], MD    Family History Family History  Problem Relation Age of Onset  . Sickle cell trait Mother   . Depression Mother   . Diabetes Mother   . Sickle cell trait Father   . Sickle cell trait Brother     Social History Social History  Substance  Use Topics  . Smoking status: Former Smoker    Packs/day: 0.50    Years: 10.00    Types: Cigarettes    Quit date: 05/29/2011  . Smokeless tobacco: Never Used  . Alcohol use No     Allergies   Patient has no known allergies.   Review of Systems Review of Systems  Constitutional: Positive for fatigue. Negative for fever.  HENT: Negative for congestion and rhinorrhea.   Eyes: Negative for visual disturbance.  Respiratory: Positive for shortness of breath. Negative for cough.   Cardiovascular: Negative for chest pain and leg swelling.  Gastrointestinal: Positive for nausea. Negative for abdominal pain and vomiting.  Genitourinary: Positive for flank pain.  Musculoskeletal: Positive for arthralgias and myalgias.  Skin: Negative for rash.  Neurological: Positive for weakness. Negative for syncope and headaches.     Physical Exam Updated Vital Signs BP 98/57   Pulse 103   Temp 98 F (36.7 C) (Oral)   Resp 14   Ht 6' (1.829 m)   Wt 180 lb (81.6 kg)   SpO2 91%   BMI 24.41 kg/m   Physical Exam  Constitutional: He appears well-developed and well-nourished. No distress.  HENT:  Head: Normocephalic.  Mouth/Throat: No oropharyngeal exudate.  Moderately dry MM  Eyes: Conjunctivae are normal. Pupils are equal, round, and reactive to light.  Neck: Neck supple.  Cardiovascular: Regular  rhythm and normal heart sounds.  Tachycardia present.  Exam reveals no friction rub.   No murmur heard. Pulmonary/Chest: Effort normal and breath sounds normal. No respiratory distress. He has no wheezes. He has no rales.  Abdominal: Soft. He exhibits no distension. There is no tenderness. There is no rebound and no guarding.  Musculoskeletal: He exhibits no edema.  Neurological: He is alert.  Skin: Skin is warm. Capillary refill takes less than 2 seconds. No rash noted.  Nursing note and vitals reviewed.    ED Treatments / Results  Labs (all labs ordered are listed, but only abnormal results are displayed) Labs Reviewed  CBC WITH DIFFERENTIAL/PLATELET - Abnormal; Notable for the following:       Result Value   WBC 23.9 (*)    RBC 2.52 (*)    Hemoglobin 7.3 (*)    HCT 21.8 (*)    RDW 19.8 (*)    Platelets 503 (*)    Neutro Abs 18.4 (*)    Monocytes Absolute 3.8 (*)    All other components within normal limits  COMPREHENSIVE METABOLIC PANEL - Abnormal; Notable for the following:    Potassium 5.2 (*)    CO2 19 (*)    Glucose, Bld 153 (*)    BUN 50 (*)    Creatinine, Ser 7.20 (*)    Calcium 8.8 (*)    AST 46 (*)    Total Bilirubin 5.0 (*)    GFR calc non Af Amer 9 (*)    GFR calc Af Amer 10 (*)    All other components within normal limits  BRAIN NATRIURETIC PEPTIDE - Abnormal; Notable for the following:    B Natriuretic Peptide 381.0 (*)    All other components within normal limits  RETICULOCYTES - Abnormal; Notable for the following:    Retic Ct Pct 5.4 (*)    RBC. 2.52 (*)    All other components within normal limits  BLOOD GAS, VENOUS - Abnormal; Notable for the following:    pH, Ven 7.240 (*)    pO2, Ven 31.3 (*)    Acid-base deficit 6.0 (*)  All other components within normal limits  BLOOD GAS, VENOUS - Abnormal; Notable for the following:    Bicarbonate 19.7 (*)    Acid-base deficit 6.5 (*)    All other components within normal limits  BASIC METABOLIC PANEL  - Abnormal; Notable for the following:    Sodium 133 (*)    Potassium 5.7 (*)    CO2 20 (*)    Glucose, Bld 146 (*)    BUN 55 (*)    Creatinine, Ser 7.18 (*)    Calcium 8.7 (*)    GFR calc non Af Amer 9 (*)    GFR calc Af Amer 10 (*)    All other components within normal limits  CULTURE, BLOOD (ROUTINE X 2)  CULTURE, BLOOD (ROUTINE X 2)  NA AND K (SODIUM & POTASSIUM), RAND UR  CREATININE, URINE, RANDOM  UREA NITROGEN, URINE  SALICYLATE LEVEL  APTT  HEPARIN LEVEL (UNFRACTIONATED)  URINALYSIS, ROUTINE W REFLEX MICROSCOPIC  I-STAT CG4 LACTIC ACID, ED  I-STAT TROPOININ, ED  I-STAT CG4 LACTIC ACID, ED  I-STAT TROPOININ, ED  I-STAT TROPOININ, ED    EKG  EKG Interpretation  Date/Time:  Thursday February 11 2016 09:39:15 EST Ventricular Rate:  116 PR Interval:    QRS Duration: 130 QT Interval:  325 QTC Calculation: 452 R Axis:   -121 Text Interpretation:  Sinus tachycardia Left atrial enlargement Nonspecific intraventricular conduction delay Since last EKG, there are non-specific changes in precordial leads, otherwise no acute ST elevations or depressions Confirmed by Ellender Hose MD, Lysbeth Galas [phone]) on 02/11/2016 4:42:14 PM       Radiology US Renal  Result Date: 02/11/2016 CLINICAL DATA:  Renal failure.  Sickle cell disease EXAM: RENAL / URINARY TRACT ULTRASOUND COMPLETE COMPARISON:  CT abdomen and pelvis December 15, 2015 FINDINGS: Right Kidney: Length: 11.5 cm. Echogenicity and renal cortical thickness are within normal limits. No mass, perinephric fluid, or hydronephrosis visualized. No sonographically demonstrable calculus or ureterectasis. Left Kidney: Length: 11.7 cm. Echogenicity and renal cortical thickness are within normal limits. No mass, perinephric fluid, or hydronephrosis visualized. No sonographically demonstrable calculus or ureterectasis. Bladder: Appears normal for degree of bladder distention. IMPRESSION: Study within normal limits. Electronically Signed   By: Lowella Grip III M.D.   On: 02/11/2016 15:20   Dg Chest Portable 1 View  Result Date: 02/11/2016 CLINICAL DATA:  Sickle cell anemia.  Hypoxia. EXAM: PORTABLE CHEST 1 VIEW COMPARISON:  01/31/2016. FINDINGS: PowerPort catheter with lead tip projected over right atrium. Cardiomegaly with normal pulmonary vascularity. No focal infiltrate. No pleural effusion or pneumothorax. IMPRESSION: 1. PowerPort catheter noted with lead tip projected over right atrium. 2. Cardiomegaly with pulmonary vascular prominence and bilateral interstitial prominence consistent with mild congestive heart failure. Electronically Signed   By: Marcello Moores  Register   On: 02/11/2016 10:22    Procedures .Critical Care Performed by: Duffy Bruce Authorized by: Duffy Bruce   Critical care provider statement:    Critical care time (minutes):  40   Critical care time was exclusive of:  Separately billable procedures and treating other patients   Critical care was necessary to treat or prevent imminent or life-threatening deterioration of the following conditions:  Circulatory failure, renal failure and dehydration   Critical care was time spent personally by me on the following activities:  Blood draw for specimens, development of treatment plan with patient or surrogate, discussions with consultants, evaluation of patient's response to treatment, examination of patient, obtaining history from patient or surrogate, ordering and performing treatments  and interventions, ordering and review of radiographic studies, ordering and review of laboratory studies, pulse oximetry, re-evaluation of patient's condition and review of old charts   I assumed direction of critical care for this patient from another provider in my specialty: no      (including critical care time)  Medications Ordered in ED Medications  0.9 %  sodium chloride infusion ( Intravenous New Bag/Given 02/11/16 1220)     Initial Impression / Assessment and Plan / ED  Course  I have reviewed the triage vital signs and the nursing notes.  Pertinent labs & imaging results that were available during my care of the patient were reviewed by me and considered in my medical decision making (see chart for details).  Clinical Course     36 yo M with HbSS disease complicated by h/o ACS, avascular necrosis, h/o PE on Xaerlto, CVA who presents with bilateral flank pain x 12 hours. On arrival, pt mildly hypotensive, tachycardic, and hypoxic. Initial DDx broad and includes: ACS, CHF, recurrent PE (less likely on Xarelto; states he has not missed dose), renal stone/GU etiology, also MSK sickle cell pain with dehydration. Will start fluids, send broad labs, and re-assess.  Labs and imaging are reviewed as above, and concerning for acute renal failure with Cr elevation from <1 to >7, uremia, and acidemia. Pt also has leukocytosis but no fever, no infectious sx and suspect this is 2/2 demargination. LA normal, also reassuring for lack of systemic, severe infection. CXR c/f diffuse pulmonary edema which is likely 2/2 his worsening renal failure. I discussed labs, case with Dr. Zigmund Daniel. At this time, she would prefer admission to [facility] for hydration prior to tx to [facility], as pt still making urine, lytes largely acceptable at this time. Will admit to [facility] for IVF, step down monitoring, and close observation. She agrees to hold ABX at this time given absence of fever, infectious sx. Will send urine lytes. IVF given. Pt drowsy, likely 22/ uremia and decreased clearance of opioids, but is protecting his airway. Due to his uremia and AMS, will hold on pain meds at this time.  Final Clinical Impressions(s) / ED Diagnoses   Final diagnoses:  AKI (acute kidney injury) ([place])  Uremia  Low bicarbonate  Leukocytosis, unspecified type      Duffy Bruce, MD 02/11/16 1646    Duffy Bruce, MD 02/12/16 [phone]

## 2016-02-11 NOTE — ED Notes (Signed)
Pt is still unable to provide urine sample. Pt attempted. Pt refused in and out catheter.

## 2016-02-11 NOTE — ED Notes (Signed)
2 failed attmpts to collect 2nd set of cultures

## 2016-02-11 NOTE — ED Notes (Signed)
Called to give report to SD/ICU, spoke to [facility], she is inquiring if pt can be downgraded from a SD bed.  Raliegh Ip Schorr NP notified, awaiting call

## 2016-02-11 NOTE — ED Notes (Signed)
Another nurse reported to this nurse that she witnessed  Pt taking something out of his jeans pocket, appeared to put something in his mouth, took a drink and tipped his head back like he was swallowing something.  Went in to ask pt, pt denied.

## 2016-02-11 NOTE — ED Notes (Signed)
Pt is very lethargic, responds to voice.  Each time this nurse checks on him, pt is requesting for pain med.  Explained to pt that his BP is very low.  Pt states "what's that got anything to do with my pain?"  This nurse made him aware that pain med will decrease his BP even lower.

## 2016-02-11 NOTE — ED Notes (Signed)
Chaney Malling NP  Returned call, states d/t pt's status with his BP and the possibility that he might have taken something.  Britney RN in ICU/SD made aware.

## 2016-02-11 NOTE — Progress Notes (Addendum)
ANTICOAGULATION CONSULT NOTE - Initial Consult  Pharmacy Consult for heparin Indication: hx recurrent pulmonary embolus (home xarelto on hold)  No Known Allergies  Patient Measurements: Height: 6' (182.9 cm) Weight: 180 lb (81.6 kg) IBW/kg (Calculated) : 77.6 Heparin Dosing Weight: 81 kg  Vital Signs: Temp: 98 F (36.7 C) (01/11 1233) Temp Source: Oral (01/11 1233) BP: 96/61 (01/11 1337) Pulse Rate: 101 (01/11 1337)  Labs:  Recent Labs  02/11/16 1006 02/11/16 1352  HGB 7.3*  --   HCT 21.8*  --   PLT 503*  --   CREATININE 7.20* 7.18*    Estimated Creatinine Clearance: 15.6 mL/min (by C-G formula based on SCr of 7.18 mg/dL (H)).   Medical History: Past Medical History:  Diagnosis Date  . Acute chest syndrome ([place]) 06/18/2013  . Acute embolism and thrombosis of right internal jugular vein ([place])   . Alcohol consumption of one to four drinks per day   . Avascular necrosis (HCC)    Right Hip  . Blood transfusion   . Chronic anticoagulation   . Demand ischemia ([place]) 01/02/2014  . Former smoker   . Functional asplenia   . Hb-SS disease with crisis ([place])   . History of Clostridium difficile infection   . History of pulmonary embolus (PE)   . Hypertension   . Hypokalemia   . Leukocytosis    Chronic  . Mood disorder ([place])   . Noncompliance with medication regimen   . Oxygen deficiency   . Pulmonary hypertension   . Second hand tobacco smoke exposure   . Sickle cell anemia (HCC)   . Sickle-cell crisis with associated acute chest syndrome ([place]) 05/13/2013  . Stroke ([place])   . Thrombocytosis (HCC)    Chronic  . Uses marijuana     Medications:  xarelto 20 mg daily PTA (last dose taken on 02/10/16 at 2000)   Assessment: Patient is a 36 y.o M with hx sickle cell disease and recurrent PEs on xarelto PTA,who was recently hospitalized and discharged on 02/09/16, presented to the ED on 02/11/16 with c/o rib and back pain. Patient was found to have AKI on admission. To  start heparin while xarelto is on hold.  - scr 7.18 (crcl~15), scr was 0.87 on 02/07/16 - aPTT at 8PM = 44 secs - heparin level 2.20 (affected by xarelto)  Goal of Therapy:  Heparin level 0.3-0.7 units/ml aPTT 66-102 sec seconds Monitor platelets by anticoagulation protocol: Yes   Plan:  - start heparin drip at 1150 units/hr (no bolus) - check 8 hr heparin level and aPTT (until heparin level and aPTT correlates) - monitor cbc closely and for s/s bleeding  Lurena Naeve P 02/11/2016,3:15 PM

## 2016-02-11 NOTE — H&P (Signed)
Hospital Admission Note Date: 02/11/2016  Patient name: [name] Medical record number: [id number] Date of birth: 1981/01/21 Age: 36 y.o. Gender: male PCP: [name], MD  Attending physician: [name], MD  Chief Complaint:Pain in ribs and back x several days  History of Present Illness: Opiate tolerant patient with Hb SS who was discharged 4 days ago returns to the ED today with pain in ribs and back. During the course of work up was found to have an significantly elevated BUN and Cr. Patient denies any NSAID use, vomiting or diarrhea. He states that he has had decreased urine output for the last 2 days and the urine has been particularly dark in color. He denies any fevers, rashes. Vomiting or diarrhea. With regard to his pain, he states that he awoke this morning with significant pain in the ribs and back, throbbing in nature and characteristic of his sickle cell crisis. Pt is currently awake and alert and able to answer questions without difficulty. His speech is more garbled than usual. However he has no facial asymmetry or drooling.  Scheduled Meds: Continuous Infusions: PRN Meds:. Allergies: Patient has no known allergies. Past Medical History:  Diagnosis Date  . Acute chest syndrome ([place]) 06/18/2013  . Acute embolism and thrombosis of right internal jugular vein ([place])   . Alcohol consumption of one to four drinks per day   . Avascular necrosis (HCC)    Right Hip  . Blood transfusion   . Chronic anticoagulation   . Demand ischemia ([place]) 01/02/2014  . Former smoker   . Functional asplenia   . Hb-SS disease with crisis ([place])   . History of Clostridium difficile infection   . History of pulmonary embolus (PE)   . Hypertension   . Hypokalemia   . Leukocytosis    Chronic  . Mood disorder ([place])   . Noncompliance with medication regimen   . Oxygen deficiency   . Pulmonary hypertension   . Second hand tobacco smoke exposure   . Sickle cell anemia (HCC)    . Sickle-cell crisis with associated acute chest syndrome ([place]) 05/13/2013  . Stroke ([place])   . Thrombocytosis (HCC)    Chronic  . Uses marijuana    Past Surgical History:  Procedure Laterality Date  . CHOLECYSTECTOMY     01/2008  . Excision of left periauricular cyst     10/2009  . Excision of right ear lobe cyst with primary closur     11/2007  . Porta cath placement    . Porta cath removal    . PORTACATH PLACEMENT  01/05/2012   Procedure: INSERTION PORT-A-CATH;  Surgeon: Odis Hollingshead, MD;  Location: [facility];  Service: General;  Laterality: N/A;  ultrasound guiced port a cath insertion with fluoroscopy  . Right hip replacement     08/2006  . UMBILICAL HERNIA REPAIR     01/2008   Family History  Problem Relation Age of Onset  . Sickle cell trait Mother   . Depression Mother   . Diabetes Mother   . Sickle cell trait Father   . Sickle cell trait Brother    Social History   Social History  . Marital status: Single    Spouse name: N/A  . Number of children: 0  . Years of education: 82   Occupational History  . 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 Smoking status: Former Smoker  Packs/day: 0.50    Years: 10.00    Types: Cigarettes    Quit date: 05/29/2011  . Smokeless tobacco: Never Used  . Alcohol use No  . Drug use: No     Comment: Marijuana weekly  . Sexual activity: Yes    Partners: Female     Comment: month ago   Other Topics Concern  . Not on file   Social History Narrative   Lives in an apartment.  Single.  Lives alone but has a girlfriend that helps care for him.  Does not use any assist devices.        [name]  [phone] Mom, emergency contact      [facility]:   Patient continuing to live in her apartment in town alone. 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   Review of Systems: Pertinent items noted in HPI and remainder of comprehensive ROS otherwise negative.    Physical Exam:  Intake/Output Summary (Last 24 hours) at 02/11/16 1241 Last data filed at 02/11/16 1210  Gross per 24 hour  Intake              300 ml  Output                0 ml  Net              300 ml   General: Alert, awake, oriented x3, in no acute distress.  HEENT: [place]/AT PEERL, EOMI Neck: Trachea midline,  no masses, no thyromegal,y no JVD, no carotid bruit OROPHARYNX:  Moist, No exudate/ erythema/lesions.  Heart: Regular rate and rhythm, without murmurs, rubs, gallops, PMI non-displaced, no heaves or thrills on palpation.  Lungs: Clear to auscultation, no wheezing or rhonchi noted. No increased vocal fremitus resonant to percussion  Abdomen: Soft, nontender, nondistended, positive bowel sounds, no masses no hepatosplenomegaly noted.  Neuro: No focal neurological deficits noted cranial nerves II through XII grossly intact.  Strength at functional basline in bilateral upper and lower extremities. Musculoskeletal: No warmth swelling or erythema around joints, no spinal tenderness noted. Psychiatric: Patient alert and oriented x3, good insight and cognition, good recent to remote recall.   Lab results:  Recent Labs  02/11/16 1006  NA 135  K 5.2*  CL 104  CO2 19*  GLUCOSE 153*  BUN 50*  CREATININE 7.20*  CALCIUM 8.8*    Recent Labs  02/11/16 1006  AST 46*  ALT 25  ALKPHOS 102  BILITOT 5.0*  PROT 8.0  ALBUMIN 4.6   No results for input(s): LIPASE, AMYLASE in the last 72 hours.  Recent Labs  02/11/16 1006  WBC 23.9*  NEUTROABS 18.4*  HGB 7.3*  HCT 21.8*  MCV 86.5  PLT 503*   No results for input(s): CKTOTAL, CKMB, CKMBINDEX, TROPONINI in the last 72 hours. Invalid input(s): POCBNP No results for input(s): DDIMER in the last 72 hours. No results for input(s): HGBA1C in the last 72 hours. No results for input(s): CHOL, HDL, LDLCALC, TRIG, CHOLHDL, LDLDIRECT in the last 72 hours. No results for input(s): TSH, T4TOTAL, T3FREE, THYROIDAB in the last 72  hours.  Invalid input(s): FREET3  Recent Labs  02/11/16 1006  RETICCTPCT 5.4*   Imaging results:  Dg Chest 2 View  Result Date: 01/31/2016 CLINICAL DATA:  Bilateral rib pain. History of hypertension, chronic pulmonary embolism and sickle cell anemia. EXAM: CHEST  2 VIEW COMPARISON:  Chest radiograph January 27, 2016 and CT chest January 05, 2016 FINDINGS: Cardiac silhouette is moderately  enlarged and unchanged. Mediastinal silhouette is nonsuspicious. Pulmonary vascular congestion without pleural effusion or focal consolidation. RIGHT mid and lower lung zone atelectasis/scarring. No pneumothorax. Single lumen LEFT chest Port-A-Cath. Surgical clips in the included right abdomen compatible with cholecystectomy. Osseous structures are nonacute. Focal sclerosis in the humeral head compatible with infarcts. IMPRESSION: Stable cardiomegaly and pulmonary vascular congestion. RIGHT atelectasis/scarring. Electronically Signed   By: Elon Alas M.D.   On: 01/31/2016 04:46   Dg Chest Portable 1 View  Result Date: 02/11/2016 CLINICAL DATA:  Sickle cell anemia.  Hypoxia. EXAM: PORTABLE CHEST 1 VIEW COMPARISON:  01/31/2016. FINDINGS: PowerPort catheter with lead tip projected over right atrium. Cardiomegaly with normal pulmonary vascularity. No focal infiltrate. No pleural effusion or pneumothorax. IMPRESSION: 1. PowerPort catheter noted with lead tip projected over right atrium. 2. Cardiomegaly with pulmonary vascular prominence and bilateral interstitial prominence consistent with mild congestive heart failure. Electronically Signed   By: Marcello Moores  Register   On: 02/11/2016 10:22   Other results: EKG: normal EKG, normal sinus rhythm, unchanged from previous tracings, Intraventricular conduction delay and lateral Q-waves.   Assessment and Plan:  Acute Renal Failure:  Etiology unclear but likely multifactorial. Pt denies NSAID use, vomiting or diarrhea and denies decreased intake. Will likely require  dialysis and currently but currently no beds available at [facility]. Renal Consulted. Hold all nephrotoxic medications. U/A and renal U/S pending.  Hyperkalemia: Potassium increased from renal failure. Will hold on Kayexalate since diarrhea will complicate pre-renal state. Will hydrate and re-check. If still elevated or worsening will consider Kayexalate.  Hb SS with crisis: Will hold analgesics for now as patient took medications before coming to the hospital and clearance is extremely low. Will monitor and treat pain with intermittent low dose IV analgesics. Hold all NSAID's.   Leukocytosis: Pt has a marked leukocytosis but no evidence of infection. Will hold on antibiotics and observe.  Volume depletion: Pt clinically dehydrated and with low BP. Will hydrate aggeresively with osmotic IVF. 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 Pt usually on Ambrisentan but will hold for now due to renal failure.  Chronic Anticoagulation: Pt usually on Xarelto but will place on IV Heparin.   Abnormal EKG Findings: Pt has an intra-venticular conduction delay which is likely secondary to the enlargement associated with right sided. Will resume Aspirin.   [facility]: Will place Pharmacy consult to review medications  Pt is critically ill and requires close hemodynamic monitoring and medication titration. He will be admitted ot the step-down unit.  Total time spent during this evaluation was 90 minutes.  Tiras Bianchini A. 02/11/2016, 12:41 PM

## 2016-02-12 DIAGNOSIS — J9621 Acute and chronic respiratory failure with hypoxia: Secondary | ICD-10-CM

## 2016-02-12 DIAGNOSIS — G894 Chronic pain syndrome: Secondary | ICD-10-CM

## 2016-02-12 LAB — CBC
HEMATOCRIT: 20.2 % — AB (ref 39.0–52.0)
HEMOGLOBIN: 6.7 g/dL — AB (ref 13.0–17.0)
MCH: 28.6 pg (ref 26.0–34.0)
MCHC: 33.2 g/dL (ref 30.0–36.0)
MCV: 86.3 fL (ref 78.0–100.0)
Platelets: 469 10*3/uL — ABNORMAL HIGH (ref 150–400)
RBC: 2.34 MIL/uL — ABNORMAL LOW (ref 4.22–5.81)
RDW: 19.6 % — AB (ref 11.5–15.5)
WBC: 22.2 10*3/uL — AB (ref 4.0–10.5)

## 2016-02-12 LAB — DIFFERENTIAL
BASOS ABS: 0 10*3/uL (ref 0.0–0.1)
Basophils Relative: 0 %
EOS ABS: 0.4 10*3/uL (ref 0.0–0.7)
EOS PCT: 2 %
LYMPHS ABS: 2.4 10*3/uL (ref 0.7–4.0)
Lymphocytes Relative: 11 %
MONO ABS: 3.6 10*3/uL — AB (ref 0.1–1.0)
Monocytes Relative: 16 %
NEUTROS PCT: 71 %
Neutro Abs: 15.8 10*3/uL — ABNORMAL HIGH (ref 1.7–7.7)

## 2016-02-12 LAB — HEPARIN LEVEL (UNFRACTIONATED)
HEPARIN UNFRACTIONATED: 1.5 [IU]/mL — AB (ref 0.30–0.70)
Heparin Unfractionated: 1.34 IU/mL — ABNORMAL HIGH (ref 0.30–0.70)
Heparin Unfractionated: 0.66 IU/mL (ref 0.30–0.70)

## 2016-02-12 LAB — URINALYSIS, ROUTINE W REFLEX MICROSCOPIC
Bilirubin Urine: NEGATIVE
GLUCOSE, UA: NEGATIVE mg/dL
Ketones, ur: NEGATIVE mg/dL
Leukocytes, UA: NEGATIVE
Nitrite: NEGATIVE
PH: 5 (ref 5.0–8.0)
Protein, ur: 30 mg/dL — AB
SPECIFIC GRAVITY, URINE: 1.015 (ref 1.005–1.030)

## 2016-02-12 LAB — RETICULOCYTES
RBC.: 2.25 MIL/uL — AB (ref 4.22–5.81)
RETIC COUNT ABSOLUTE: 105.8 10*3/uL (ref 19.0–186.0)
Retic Ct Pct: 4.7 % — ABNORMAL HIGH (ref 0.4–3.1)

## 2016-02-12 LAB — COMPREHENSIVE METABOLIC PANEL
ALBUMIN: 4.2 g/dL (ref 3.5–5.0)
ALT: 23 U/L (ref 17–63)
ANION GAP: 9 (ref 5–15)
AST: 43 U/L — ABNORMAL HIGH (ref 15–41)
Alkaline Phosphatase: 89 U/L (ref 38–126)
BUN: 64 mg/dL — ABNORMAL HIGH (ref 6–20)
CALCIUM: 8.3 mg/dL — AB (ref 8.9–10.3)
CHLORIDE: 104 mmol/L (ref 101–111)
CO2: 19 mmol/L — AB (ref 22–32)
Creatinine, Ser: 6.2 mg/dL — ABNORMAL HIGH (ref 0.61–1.24)
GFR calc non Af Amer: 10 mL/min — ABNORMAL LOW (ref >60)
GFR, EST AFRICAN AMERICAN: 12 mL/min — AB (ref >60)
GLUCOSE: 130 mg/dL — AB (ref 65–99)
POTASSIUM: 5.4 mmol/L — AB (ref 3.5–5.1)
SODIUM: 132 mmol/L — AB (ref 135–145)
Total Bilirubin: 3.2 mg/dL — ABNORMAL HIGH (ref 0.3–1.2)
Total Protein: 7.1 g/dL (ref 6.5–8.1)

## 2016-02-12 LAB — SALICYLATE LEVEL: Salicylate Lvl: <7 mg/dL (ref 2.8–30.0)

## 2016-02-12 LAB — PHOSPHORUS: Phosphorus: 6.6 mg/dL — ABNORMAL HIGH (ref 2.5–4.6)

## 2016-02-12 LAB — UREA NITROGEN, URINE: Urea Nitrogen, Ur: 107 mg/dL

## 2016-02-12 LAB — APTT
APTT: 60 s — AB (ref 24–36)
APTT: 65 s — AB (ref 24–36)
aPTT: 66 seconds — ABNORMAL HIGH (ref 24–36)

## 2016-02-12 MED ORDER — SODIUM CHLORIDE 0.9 % IV SOLN
INTRAVENOUS | Status: DC
Start: 2016-02-12 — End: 2016-02-16
  Administered 2016-02-12 – 2016-02-14 (×6): via INTRAVENOUS

## 2016-02-12 MED ORDER — HYDROMORPHONE 1 MG/ML IV SOLN
INTRAVENOUS | Status: DC
  Administered 2016-02-12: 13:00:00 via INTRAVENOUS
  Administered 2016-02-12: 2.4 mg via INTRAVENOUS
  Administered 2016-02-13: 5.6 mg via INTRAVENOUS
  Administered 2016-02-13: 4.37 mg via INTRAVENOUS
  Administered 2016-02-13: 21:00:00 via INTRAVENOUS
  Administered 2016-02-13: 0.8 mg via INTRAVENOUS
  Administered 2016-02-13: 0.399 mg via INTRAVENOUS
  Administered 2016-02-13 – 2016-02-14 (×2): 1.2 mg via INTRAVENOUS
  Administered 2016-02-14: 6.8 mg via INTRAVENOUS
  Administered 2016-02-14: 5.2 mg via INTRAVENOUS
  Administered 2016-02-14: 1.6 mg via INTRAVENOUS
  Filled 2016-02-12 (×2): qty 25

## 2016-02-12 MED ORDER — SODIUM CHLORIDE 0.9% FLUSH
10.0000 mL | INTRAVENOUS | Status: DC | PRN
  Administered 2016-02-12 – 2016-02-13 (×2): 10 mL
  Filled 2016-02-12 (×2): qty 40

## 2016-02-12 MED ORDER — DIPHENHYDRAMINE HCL 25 MG PO CAPS
25.0000 mg | ORAL_CAPSULE | ORAL | Status: DC | PRN
  Administered 2016-02-12 – 2016-02-13 (×3): 25 mg via ORAL
  Filled 2016-02-12 (×4): qty 1

## 2016-02-12 MED ORDER — NALOXONE HCL 0.4 MG/ML IJ SOLN: 0.4000 mg | INTRAMUSCULAR | Status: DC | PRN

## 2016-02-12 MED ORDER — ONDANSETRON HCL 4 MG/2ML IJ SOLN
4.0000 mg | Freq: Four times a day (QID) | INTRAMUSCULAR | Status: DC | PRN
Start: 2016-02-12 — End: 2016-02-16
  Filled 2016-02-12: qty 2

## 2016-02-12 MED ORDER — HEPARIN (PORCINE) IN NACL 100-0.45 UNIT/ML-% IJ SOLN
1250.0000 [IU]/h | INTRAMUSCULAR | Status: DC
  Administered 2016-02-12 (×2): 1250 [IU]/h via INTRAVENOUS
  Filled 2016-02-12 (×2): qty 250

## 2016-02-12 MED ORDER — SODIUM CHLORIDE 0.9% FLUSH: 9.0000 mL | INTRAVENOUS | Status: DC | PRN

## 2016-02-12 MED ORDER — SODIUM CHLORIDE 0.9% FLUSH
3.0000 mL | Freq: Two times a day (BID) | INTRAVENOUS | Status: DC
  Administered 2016-02-12 – 2016-02-16 (×4): 3 mL via INTRAVENOUS

## 2016-02-12 NOTE — Progress Notes (Signed)
SICKLE CELL SERVICE PROGRESS NOTE  [name] [id number] DOB: Mar 09, 1980 DOA: 02/11/2016 PCP: [name], MD  Assessment/Plan: Active Problems:   Acute renal failure (ARF) ([place])  1. Acute Renal Failure: Pt's renal function starting to improve with aggressive hydration. Appreciate nephrology input. Continue IV Hydration. Nephrotoxic agents on hold. U/A not concerning.  2. Hb SS with crisis: Will start low dose Dilaudid PCA. As renal function improves, will re-assess restarting long acting opiates.  3. Leukocytosis: labs pending from today. 4. Anemia of chronic disease. Labs pending from today. Will type and cross.  5. Chronic pain: Ms Contin being held due to renal failure.  6. Chronic Hypoxic Respiratory failure: At baseline of Oxygen supplementation.  7. Chronic anticoagulation: Due to renal function. Xarelto on hold and pt on IV Heparin. 8. Abnormal EKG: Pt has an intra-venticular conduction delay which is likely secondary to the enlargement associated with right sided. Will resume Aspirin.  Code Status: Full Code Family Communication: N/A Disposition Plan: Stable. Transfer to telemetry unit.  [name] A.  Pager [phone]. If 7PM-7AM, please contact night-coverage.  02/12/2016, 12:07 PM  LOS: 1 day   Interim  History: Pt much more alert and speech at baseline today. He is oriented x 3 and again reinforces that he had no volume loss or NSAID use since admission. He reports that he currently has pain in the low back at an intensity of 9/10 and in the ribs at 8/10.   Consultants:  Nephrology  Procedures:  None  Antibiotics:  None   Objective: Vitals:   02/12/16 0700 02/12/16 0800 02/12/16 0900 02/12/16 1000  BP: 112/71 (!) 102/52 (!) 110/58 (!) 107/37  Pulse: 97 94 100 96  Resp: (!) 8 13 14 16   Temp:  98.7 F (37.1 C)    TempSrc:  Oral    SpO2: 90% 100% 92% 94%  Weight:      Height:       Weight change:   Intake/Output Summary (Last 24  hours) at 02/12/16 1207 Last data filed at 02/12/16 1100  Gross per 24 hour  Intake             1354 ml  Output              250 ml  Net             1104 ml    General: Alert, awake, oriented x3, in moderate distress due to pain.  HEENT: [place]/AT PEERL, EOMI, anicteric Neck: Trachea midline,  no masses, no thyromegal,y no JVD, no carotid bruit OROPHARYNX:  Moist, No exudate/ erythema/lesions.  Heart: Regular rate and rhythm, without murmurs, rubs, gallops, PMI non-displaced, no heaves or thrills on palpation.  Lungs: Clear to auscultation, no wheezing or rhonchi noted. No increased vocal fremitus resonant to percussion  Abdomen: Soft, nontender, nondistended, positive bowel sounds, no masses no hepatosplenomegaly noted.  Neuro: No focal neurological deficits noted cranial nerves II through XII grossly intact.  Strength at functional baseline in bilateral upper and lower extremities. Musculoskeletal: No warmth swelling or erythema around joints, no spinal tenderness noted. Psychiatric: Patient alert and oriented x3, good insight and cognition, good recent to remote recall.    Data Reviewed: Basic Metabolic Panel:  Recent Labs Lab 02/07/16 0502 02/11/16 1006 02/11/16 1352 02/11/16 2142 02/12/16 0907  NA 140 135 133* 134* 132*  K 4.9 5.2* 5.7* 5.3* 5.4*  CL 111 104 102 104 104  CO2 25 19* 20* 21* 19*  GLUCOSE 131* 153* 146*  139* 130*  BUN 13 50* 55* 59* 64*  CREATININE 0.87 7.20* 7.18* 7.62* 6.20*  CALCIUM 8.8* 8.8* 8.7* 8.6* 8.3*   Liver Function Tests:  Recent Labs Lab 02/07/16 0502 02/11/16 1006 02/12/16 0907  AST 43* 46* 43*  ALT 25 25 23   ALKPHOS 109 102 89  BILITOT 4.0* 5.0* 3.2*  PROT 6.9 8.0 7.1  ALBUMIN 3.8 4.6 4.2   No results for input(s): LIPASE, AMYLASE in the last 168 hours. No results for input(s): AMMONIA in the last 168 hours. CBC:  Recent Labs Lab 02/06/16 0529 02/07/16 0502 02/11/16 1006 02/12/16 0501  WBC 10.5 9.6 23.9* 22.2*  NEUTROABS  6.2 5.0 18.4* 15.8*  HGB 6.3* 7.3* 7.3* 6.7*  HCT 18.1* 21.0* 21.8* 20.2*  MCV 89.2 87.9 86.5 86.3  PLT 402* 394 503* 469*   Cardiac Enzymes: No results for input(s): CKTOTAL, CKMB, CKMBINDEX, TROPONINI in the last 168 hours. BNP (last 3 results)  Recent Labs  08/02/15 2307 11/28/15 0520 02/11/16 1006  BNP 503.2* 422.5* 381.0*    ProBNP (last 3 results) No results for input(s): PROBNP in the last 8760 hours.  CBG: No results for input(s): GLUCAP in the last 168 hours.  Recent Results (from the past 240 hour(s))  MRSA PCR Screening     Status: None   Collection Time: 02/11/16  9:48 PM  Result Value Ref Range Status   MRSA by PCR NEGATIVE NEGATIVE Final    Comment:        The GeneXpert MRSA Assay (FDA approved for NASAL specimens only), is one component of a comprehensive MRSA colonization surveillance program. It is not intended to diagnose MRSA infection nor to guide or monitor treatment for MRSA infections.      Studies: Dg Chest 2 View  Result Date: 01/31/2016 CLINICAL DATA:  Bilateral rib pain. History of hypertension, chronic pulmonary embolism and sickle cell anemia. EXAM: CHEST  2 VIEW COMPARISON:  Chest radiograph January 27, 2016 and CT chest January 05, 2016 FINDINGS: Cardiac silhouette is moderately enlarged and unchanged. Mediastinal silhouette is nonsuspicious. Pulmonary vascular congestion without pleural effusion or focal consolidation. RIGHT mid and lower lung zone atelectasis/scarring. No pneumothorax. Single lumen LEFT chest Port-A-Cath. Surgical clips in the included right abdomen compatible with cholecystectomy. Osseous structures are nonacute. Focal sclerosis in the humeral head compatible with infarcts. IMPRESSION: Stable cardiomegaly and pulmonary vascular congestion. RIGHT atelectasis/scarring. Electronically Signed   By: Elon Alas M.D.   On: 01/31/2016 04:46   US Renal  Result Date: 02/11/2016 CLINICAL DATA:  Renal failure.  Sickle  cell disease EXAM: RENAL / URINARY TRACT ULTRASOUND COMPLETE COMPARISON:  CT abdomen and pelvis December 15, 2015 FINDINGS: Right Kidney: Length: 11.5 cm. Echogenicity and renal cortical thickness are within normal limits. No mass, perinephric fluid, or hydronephrosis visualized. No sonographically demonstrable calculus or ureterectasis. Left Kidney: Length: 11.7 cm. Echogenicity and renal cortical thickness are within normal limits. No mass, perinephric fluid, or hydronephrosis visualized. No sonographically demonstrable calculus or ureterectasis. Bladder: Appears normal for degree of bladder distention. IMPRESSION: Study within normal limits. Electronically Signed   By: Lowella Grip III M.D.   On: 02/11/2016 15:20   Dg Chest Portable 1 View  Result Date: 02/11/2016 CLINICAL DATA:  Sickle cell anemia.  Hypoxia. EXAM: PORTABLE CHEST 1 VIEW COMPARISON:  01/31/2016. FINDINGS: PowerPort catheter with lead tip projected over right atrium. Cardiomegaly with normal pulmonary vascularity. No focal infiltrate. No pleural effusion or pneumothorax. IMPRESSION: 1. PowerPort catheter noted with lead tip projected over  right atrium. 2. Cardiomegaly with pulmonary vascular prominence and bilateral interstitial prominence consistent with mild congestive heart failure. Electronically Signed   By: Marcello Moores  Register   On: 02/11/2016 10:22    Scheduled Meds: . HYDROmorphone   Intravenous Q4H  . HYDROmorphone  2 mg Oral Once  . sodium chloride flush  3 mL Intravenous Q12H   Continuous Infusions: . sodium chloride 150 mL/hr at 02/12/16 1149  . heparin 1,150 Units/hr (02/12/16 1000)    Active Problems:   Acute renal failure (ARF) (HCC)     In excess of 35 minutes spent during this visit. Greater than 50% involved face to face contact with the patient for assessment, counseling and coordination of care.

## 2016-02-12 NOTE — Progress Notes (Signed)
Transfer from ICU/SD, reviewed previous assessment and agree, pt stable pain controlled with PCA. No acute distress noted. Will cont to monitor. SRP, RN

## 2016-02-12 NOTE — Progress Notes (Signed)
Pharmacy Consult - Renal Dosing of Medications  Labs: Scr 6.2 improved CrCl 18  A/P: When looking at whether any of patient's home medications can be restarted at this point, would not recommend restarting lisinopril, Kdur, Xarelto, torsemide or Jadenu. The following meds can be adjusted if necessary to restart: Gabapentin per current renal function should be reduced to 100-300 mg/day max and Hydrea if needs to be restarted right now can be reduced by 50%. The following meds can be restarted at previous doses: aspirin, Ambien, folic acid, vitamin D, and MS Contin. Letairis has no recommendations in renal failure but no studies have been conducted and Dilaudid PO also has no recommendations in renal failure except continued monitoring of respiration and CNS depression as is already done regardless  Adrian Saran, PharmD, BCPS Pager [phone] 02/12/2016 10:49 AM

## 2016-02-12 NOTE — Progress Notes (Signed)
Patient unable to urinate, and refuses straight cath. Patient states, "Can I try again shortly first?" RN negotiates an hours time before urine is drained with straight cath, and patient agrees.

## 2016-02-12 NOTE — Progress Notes (Signed)
Late Entry, entered for Luanna Salk, SLP in her absence.   December 25, 2015 1010  SLP G-Codes **NOT FOR INPATIENT CLASS**  Functional Assessment Tool Used [facility])  Functional Limitations Attention  Attention Current Status [id number]) CI  Attention Goal Status [id number]) CI  Attention Discharge Status [id number]) CI  SLP Evaluations  $ SLP Speech Visit 1 Procedure  SLP Evaluations  $Cognitive Skills Development 23-37  $ SLP EVAL LANGUAGE/SOUND PRODUCTION 1 Procedure

## 2016-02-12 NOTE — Progress Notes (Signed)
ANTICOAGULATION CONSULT NOTE - f/u Consult  Pharmacy Consult for heparin Indication: hx recurrent pulmonary embolus (home xarelto on hold)  No Known Allergies  Patient Measurements: Height: 6' (182.9 cm) Weight: 183 lb 3.2 oz (83.1 kg) IBW/kg (Calculated) : 77.6 Heparin Dosing Weight: 81 kg  Vital Signs: Temp: 98.1 F (36.7 C) (01/12 1200) Temp Source: Oral (01/12 1200) BP: 115/67 (01/12 1300) Pulse Rate: 96 (01/12 1300)  Labs:  Recent Labs  02/11/16 1006 02/11/16 1352 02/11/16 2024 02/11/16 2142 02/12/16 0501 02/12/16 0907 02/12/16 0933  HGB 7.3*  --   --   --  6.7*  --   --   HCT 21.8*  --   --   --  20.2*  --   --   PLT 503*  --   --   --  469*  --   --   APTT  --   --  44*  --  66*  --  60*  HEPARINUNFRC  --   --  2.20*  --  1.50*  --  1.34*  CREATININE 7.20* 7.18*  --  7.62*  --  6.20*  --     Estimated Creatinine Clearance: 18.1 mL/min (by C-G formula based on SCr of 6.2 mg/dL (H)).   Medical History: Past Medical History:  Diagnosis Date  . Acute chest syndrome ([place]) 06/18/2013  . Acute embolism and thrombosis of right internal jugular vein ([place])   . Alcohol consumption of one to four drinks per day   . Avascular necrosis (HCC)    Right Hip  . Blood transfusion   . Chronic anticoagulation   . Demand ischemia ([place]) 01/02/2014  . Former smoker   . Functional asplenia   . Hb-SS disease with crisis ([place])   . History of Clostridium difficile infection   . History of pulmonary embolus (PE)   . Hypertension   . Hypokalemia   . Leukocytosis    Chronic  . Mood disorder ([place])   . Noncompliance with medication regimen   . Oxygen deficiency   . Pulmonary hypertension   . Second hand tobacco smoke exposure   . Sickle cell anemia (HCC)   . Sickle-cell crisis with associated acute chest syndrome ([place]) 05/13/2013  . Stroke ([place])   . Thrombocytosis (HCC)    Chronic  . Uses marijuana     Medications:  xarelto 20 mg daily PTA (last dose taken on 02/10/16 at  2000)   Assessment: Patient is a 36 y.o M with hx sickle cell disease and recurrent PEs on xarelto PTA,who was recently hospitalized and discharged on 02/09/16, presented to the ED on 02/11/16 with c/o rib and back pain. Patient was found to have AKI on admission. To start heparin while xarelto is on hold.  Baseline labs - scr 7.18 (crcl~15), scr was 0.87 on 02/07/16 - aPTT at 8PM = 44 secs - heparin level 2.20 (affected by xarelto)  Today, 02/12/16  APTT now subtherapeutic slightly while the heparin level as expected continues to be supratherapeutic and likely will be for quite a few days as long as patient in ARF due to Xarelto not being able to be eliminated efficiently  Per RN, no reported bleeding and no problems with heparin IV site  Goal of Therapy:  Heparin level 0.3-0.7 units/ml aPTT 66-102 sec seconds Monitor platelets by anticoagulation protocol: Yes   Plan:  1) Increase IV heparin rate from 1150 units/hr to 1250 units/hr 2) Recheck aPTT and heparin level 6 hours after rate increase 3)  Daily CBC and aPTT/HL   Adrian Saran, PharmD, BCPS Pager [phone] 02/12/2016 2:49 PM

## 2016-02-12 NOTE — Progress Notes (Signed)
ANTICOAGULATION CONSULT NOTE - f/u Consult  Pharmacy Consult for heparin Indication: hx recurrent pulmonary embolus (home xarelto on hold)  No Known Allergies  Patient Measurements: Height: 6' (182.9 cm) Weight: 183 lb 3.2 oz (83.1 kg) IBW/kg (Calculated) : 77.6 Heparin Dosing Weight: 81 kg  Vital Signs: Temp: 98.8 F (37.1 C) (01/12 0336) Temp Source: Oral (01/12 0336) BP: 110/55 (01/12 0400) Pulse Rate: 96 (01/12 0400)  Labs:  Recent Labs  02/11/16 1006 02/11/16 1352 02/11/16 2024 02/11/16 2142 02/12/16 0501  HGB 7.3*  --   --   --  6.7*  HCT 21.8*  --   --   --  20.2*  PLT 503*  --   --   --  469*  APTT  --   --  44*  --  66*  HEPARINUNFRC  --   --  2.20*  --  1.50*  CREATININE 7.20* 7.18*  --  7.62*  --     Estimated Creatinine Clearance: 14.7 mL/min (by C-G formula based on SCr of 7.62 mg/dL (H)).   Medical History: Past Medical History:  Diagnosis Date  . Acute chest syndrome ([place]) 06/18/2013  . Acute embolism and thrombosis of right internal jugular vein ([place])   . Alcohol consumption of one to four drinks per day   . Avascular necrosis (HCC)    Right Hip  . Blood transfusion   . Chronic anticoagulation   . Demand ischemia ([place]) 01/02/2014  . Former smoker   . Functional asplenia   . Hb-SS disease with crisis ([place])   . History of Clostridium difficile infection   . History of pulmonary embolus (PE)   . Hypertension   . Hypokalemia   . Leukocytosis    Chronic  . Mood disorder ([place])   . Noncompliance with medication regimen   . Oxygen deficiency   . Pulmonary hypertension   . Second hand tobacco smoke exposure   . Sickle cell anemia (HCC)   . Sickle-cell crisis with associated acute chest syndrome ([place]) 05/13/2013  . Stroke ([place])   . Thrombocytosis (HCC)    Chronic  . Uses marijuana     Medications:  xarelto 20 mg daily PTA (last dose taken on 02/10/16 at 2000)   Assessment: Patient is a 36 y.o M with hx sickle cell disease and recurrent  PEs on xarelto PTA,who was recently hospitalized and discharged on 02/09/16, presented to the ED on 02/11/16 with c/o rib and back pain. Patient was found to have AKI on admission. To start heparin while xarelto is on hold.  - scr 7.18 (crcl~15), scr was 0.87 on 02/07/16 - aPTT at 8PM = 44 secs - heparin level 2.20 (affected by xarelto)  Today, 1/12 -aPtt=66 and HL=1.50 at goal of 66-102 sec, no bleeding or infusion issues per RN  Goal of Therapy:  Heparin level 0.3-0.7 units/ml aPTT 66-102 sec seconds Monitor platelets by anticoagulation protocol: Yes   Plan:  - continue heparin drip at 1150 units/hr  -Recheck aPtt and HL  (until heparin level and aPTT correlates) - monitor cbc closely and for s/s bleeding  Dorrene German 02/12/2016,6:14 AM

## 2016-02-12 NOTE — Consult Note (Signed)
Renal Service Consult Note [facility]  SUKHJIT FLUCKIGER 02/12/2016 [place] Requesting Physician:  Dr. Zigmund Daniel  Reason for Consult:  AKI HPI: The patient is a 36 y.o. year-old with history of sickle cell disease, etoh abuse, AVN, tobacco, chronic anticoag, CDI, hx PE, HTN, stroke and chest syndrome presented to ED 1/11 with pain in ribs and back, c/w his usual crisis pain.  Was lethargic in triage and BP's in 90's.  Labs check and creat was 7.  Admitted. Started on IVF's with NS at 150 /hr.  This am creat stil 7.  Asked to see for AKI.    Home meds are ambrisentan (endothelin recept antagonist for pHTN, nonrenal clearance), hydrea, lisinopril, MS Contin, KCl, Xarelto, demadex 20/d prn, asa, ambien, asa, Jadenu, folate, neurontin 300 tid, dilaudid.    He doesn't have any specific complaints this am other than back pain.  Made 500 cc urine this am.     ROS  denies CP  no joint pain   no HA  no blurry vision  no rash  no diarrhea  no nausea/ vomiting  no dysuria  no difficulty voiding  no change in urine color    Past Medical History  Past Medical History:  Diagnosis Date  . Acute chest syndrome ([place]) 06/18/2013  . Acute embolism and thrombosis of right internal jugular vein ([place])   . Alcohol consumption of one to four drinks per day   . Avascular necrosis (HCC)    Right Hip  . Blood transfusion   . Chronic anticoagulation   . Demand ischemia ([place]) 01/02/2014  . Former smoker   . Functional asplenia   . Hb-SS disease with crisis ([place])   . History of Clostridium difficile infection   . History of pulmonary embolus (PE)   . Hypertension   . Hypokalemia   . Leukocytosis    Chronic  . Mood disorder ([place])   . Noncompliance with medication regimen   . Oxygen deficiency   . Pulmonary hypertension   . Second hand tobacco smoke exposure   . Sickle cell anemia (HCC)   . Sickle-cell crisis with associated acute chest syndrome ([place]) 05/13/2013  . Stroke  ([place])   . Thrombocytosis (HCC)    Chronic  . Uses marijuana    Past Surgical History  Past Surgical History:  Procedure Laterality Date  . CHOLECYSTECTOMY     01/2008  . Excision of left periauricular cyst     10/2009  . Excision of right ear lobe cyst with primary closur     11/2007  . Porta cath placement    . Porta cath removal    . PORTACATH PLACEMENT  01/05/2012   Procedure: INSERTION PORT-A-CATH;  Surgeon: Odis Hollingshead, MD;  Location: [facility];  Service: General;  Laterality: N/A;  ultrasound guiced port a cath insertion with fluoroscopy  . Right hip replacement     08/2006  . UMBILICAL HERNIA REPAIR     01/2008   Family History  Family History  Problem Relation Age of Onset  . Sickle cell trait Mother   . Depression Mother   . Diabetes Mother   . Sickle cell trait Father   . Sickle cell trait Brother    Social History  reports that he quit smoking about 4 years ago. His smoking use included Cigarettes. He has a 5.00 pack-year smoking history. He has never used smokeless tobacco. He reports that he does not drink alcohol or use drugs. Allergies No Known  Allergies Home medications Prior to Admission medications   Medication Sig Start Date End Date Taking? Authorizing Provider  ambrisentan (LETAIRIS) 5 MG tablet Take 5 mg by mouth at bedtime.   Yes Historical Provider, MD  aspirin EC 81 MG tablet Take 81 mg by mouth daily.   Yes Historical Provider, MD  cholecalciferol (VITAMIN D) 1000 units tablet Take 2,000 Units by mouth daily.   Yes Historical Provider, MD  Deferasirox (JADENU) 360 MG TABS Take 1,080 mg by mouth daily.    Yes Historical Provider, MD  folic acid (FOLVITE) 1 MG tablet Take 1 mg by mouth daily.   Yes Historical Provider, MD  gabapentin (NEURONTIN) 300 MG capsule Take 1 capsule (300 mg total) by mouth 3 (three) times daily. 06/01/15  Yes Tresa Garter, MD  HYDROmorphone (DILAUDID) 4 MG tablet Take 1 tablet (4 mg total) by mouth every 4 (four) hours  as needed for moderate pain or severe pain. 02/04/16 02/19/16 Yes Olugbemiga Essie Christine, MD  hydroxyurea (HYDREA) 500 MG capsule Take 2 capsules (1,000 mg total) by mouth daily. May take with food to minimize GI side effects. Hold Hydrea until evaluated by Primary Provider or Hematologist. 12/22/15  Yes [name], MD  lisinopril (PRINIVIL,ZESTRIL) 5 MG tablet Take 1 tablet (5 mg total) by mouth daily. Patient taking differently: Take 5 mg by mouth every evening.  08/04/15  Yes [name], MD  morphine (MS CONTIN) 30 MG 12 hr tablet Take 1 tablet (30 mg total) by mouth every 12 (twelve) hours. 02/09/16 03/10/16 Yes Tresa Garter, MD  potassium chloride SA (K-DUR,KLOR-CON) 20 MEQ tablet Take 2 tablets (40 mEq total) by mouth daily. 12/23/15  Yes [name], MD  rivaroxaban (XARELTO) 20 MG TABS tablet Take 1 tablet (20 mg total) by mouth at bedtime. 12/29/15  Yes Tresa Garter, MD  torsemide (DEMADEX) 20 MG tablet Take 20 mg by mouth daily as needed (for weight gain greater than 3lbs.).    Yes Historical Provider, MD  zolpidem (AMBIEN) 10 MG tablet Take 1 tablet (10 mg total) by mouth at bedtime as needed for sleep. 02/09/16 03/10/16 Yes Tresa Garter, MD  aspirin 81 MG chewable tablet Chew 1 tablet (81 mg total) by mouth daily. Patient not taking: Reported on 02/11/2016 07/23/14   [name], MD   Liver Function Tests  Recent Labs Lab 02/07/16 0502 02/11/16 1006  AST 43* 46*  ALT 25 25  ALKPHOS 109 102  BILITOT 4.0* 5.0*  PROT 6.9 8.0  ALBUMIN 3.8 4.6   No results for input(s): LIPASE, AMYLASE in the last 168 hours. CBC  Recent Labs Lab 02/06/16 0529 02/07/16 0502 02/11/16 1006 02/12/16 0501  WBC 10.5 9.6 23.9* 22.2*  NEUTROABS 6.2 5.0 18.4*  --   HGB 6.3* 7.3* 7.3* 6.7*  HCT 18.1* 21.0* 21.8* 20.2*  MCV 89.2 87.9 86.5 86.3  PLT 402* 394 503* [id number]*   Basic Metabolic Panel  Recent Labs Lab 02/06/16 0529 02/07/16 0502 02/11/16 1006  02/11/16 1352 02/11/16 2142  NA 142 140 135 133* 134*  K 4.8 4.9 5.2* 5.7* 5.3*  CL 112* 111 104 102 104  CO2 26 25 19* 20* 21*  GLUCOSE 107* 131* 153* 146* 139*  BUN 17 13 50* 55* 59*  CREATININE 0.96 0.87 7.20* 7.18* 7.62*  CALCIUM 8.5* 8.8* 8.8* 8.7* 8.6*   Iron/TIBC/Ferritin/ %Sat    Component Value Date/Time   IRON 157 (H) 09/22/2013 0813   TIBC NOT  CALC 09/22/2013 0813   FERRITIN 1,896 (H) 07/07/2014 1458   IRONPCTSAT NOT CALC 09/22/2013 0813    Vitals:   02/12/16 0500 02/12/16 0600 02/12/16 0700 02/12/16 0800  BP: 120/70 (!) 114/55 112/71   Pulse: (!) 101 99 97   Resp: 11 12 (!) 8   Temp:    98.7 F (37.1 C)  TempSrc:    Oral  SpO2: 92% 92% 90%   Weight:      Height:       Exam Gen groggy but fully oriented and responsive No rash, cyanosis or gangrene Sclera anicteric, throat clear  No jvd or bruits Chest clear bilat  RRR no MRG Abd soft ntnd no mass or ascites +bs GU normal male MS no joint effusions or deformity Ext no LE edema / no wounds or ulcers Neuro is alert, Ox 3 , nf, no asterixis  UA - pending Renal US - normal kidneys  Assessment: 1  Acute renal failure - in patient with SCD, chronic pulm HTN, on ACEi and prob dehydrated/ vol depleted.  No N/V/D however. Also is on deferasirox which is known to cause ATN and Fanconi's (prox tubular injury) in many case reports.  Could be hemodynamic related to pHTN and ACEi, and/or could have Fe chelator toxicity.  Await urine studies.  Making urine this am which is a good sign.   2  Hypotension - due to vol depletion, better 3  Sickle cell disease/ acute painful crisis 3  Pulm HTN  4  Hx PE on Xarelto 5  Hx AVF R THR  Plan - cont to hold ACEi / deferasirox, cont IVF"s, creat in am, UA pending  Kelly Splinter MD [place] pager [phone]   02/12/2016, 8:59 AM

## 2016-02-13 DIAGNOSIS — I2721 Secondary pulmonary arterial hypertension: Secondary | ICD-10-CM

## 2016-02-13 LAB — RENAL FUNCTION PANEL
ANION GAP: 6 (ref 5–15)
Albumin: 3.8 g/dL (ref 3.5–5.0)
BUN: 43 mg/dL — ABNORMAL HIGH (ref 6–20)
CHLORIDE: 112 mmol/L — AB (ref 101–111)
CO2: 21 mmol/L — ABNORMAL LOW (ref 22–32)
Calcium: 8.2 mg/dL — ABNORMAL LOW (ref 8.9–10.3)
Creatinine, Ser: 1.9 mg/dL — ABNORMAL HIGH (ref 0.61–1.24)
GFR calc non Af Amer: 44 mL/min — ABNORMAL LOW (ref >60)
GFR, EST AFRICAN AMERICAN: 51 mL/min — AB (ref >60)
Glucose, Bld: 120 mg/dL — ABNORMAL HIGH (ref 65–99)
POTASSIUM: 5.5 mmol/L — AB (ref 3.5–5.1)
Phosphorus: 4.6 mg/dL (ref 2.5–4.6)
Sodium: 139 mmol/L (ref 135–145)

## 2016-02-13 LAB — CBC WITH DIFFERENTIAL/PLATELET
Basophils Absolute: 0.1 10*3/uL (ref 0.0–0.1)
Basophils Relative: 0 %
Eosinophils Absolute: 0.5 10*3/uL (ref 0.0–0.7)
Eosinophils Relative: 4 %
HEMATOCRIT: 17.5 % — AB (ref 39.0–52.0)
Hemoglobin: 5.8 g/dL — CL (ref 13.0–17.0)
LYMPHS PCT: 16 %
Lymphs Abs: 2 10*3/uL (ref 0.7–4.0)
MCH: 28.9 pg (ref 26.0–34.0)
MCHC: 33.1 g/dL (ref 30.0–36.0)
MCV: 87.1 fL (ref 78.0–100.0)
MONO ABS: 2 10*3/uL — AB (ref 0.1–1.0)
Monocytes Relative: 16 %
NEUTROS ABS: 7.9 10*3/uL — AB (ref 1.7–7.7)
Neutrophils Relative %: 63 %
Platelets: 409 10*3/uL — ABNORMAL HIGH (ref 150–400)
RBC: 2.01 MIL/uL — AB (ref 4.22–5.81)
RDW: 19.4 % — AB (ref 11.5–15.5)
WBC: 12.5 10*3/uL — ABNORMAL HIGH (ref 4.0–10.5)

## 2016-02-13 LAB — HEPARIN LEVEL (UNFRACTIONATED)
HEPARIN UNFRACTIONATED: 0.36 [IU]/mL (ref 0.30–0.70)
Heparin Unfractionated: 0.25 IU/mL — ABNORMAL LOW (ref 0.30–0.70)
Heparin Unfractionated: 0.14 IU/mL — ABNORMAL LOW (ref 0.30–0.70)

## 2016-02-13 LAB — RETICULOCYTES
RBC.: 1.99 MIL/uL — ABNORMAL LOW (ref 4.22–5.81)
Retic Count, Absolute: 75.6 10*3/uL (ref 19.0–186.0)
Retic Ct Pct: 3.8 % — ABNORMAL HIGH (ref 0.4–3.1)

## 2016-02-13 LAB — APTT
APTT: 80 s — AB (ref 24–36)
APTT: 58 s — AB (ref 24–36)

## 2016-02-13 IMAGING — CR DG CHEST 2V
2 series · 2 of 2 positions shown · non-contrast
Comparison: Portable chest x-ray of 02/14/2012

CLINICAL DATA: Former smoking history, leukocytosis, history of
sickle cell disease

EXAM:
CHEST  2 VIEW

[w chest pa]
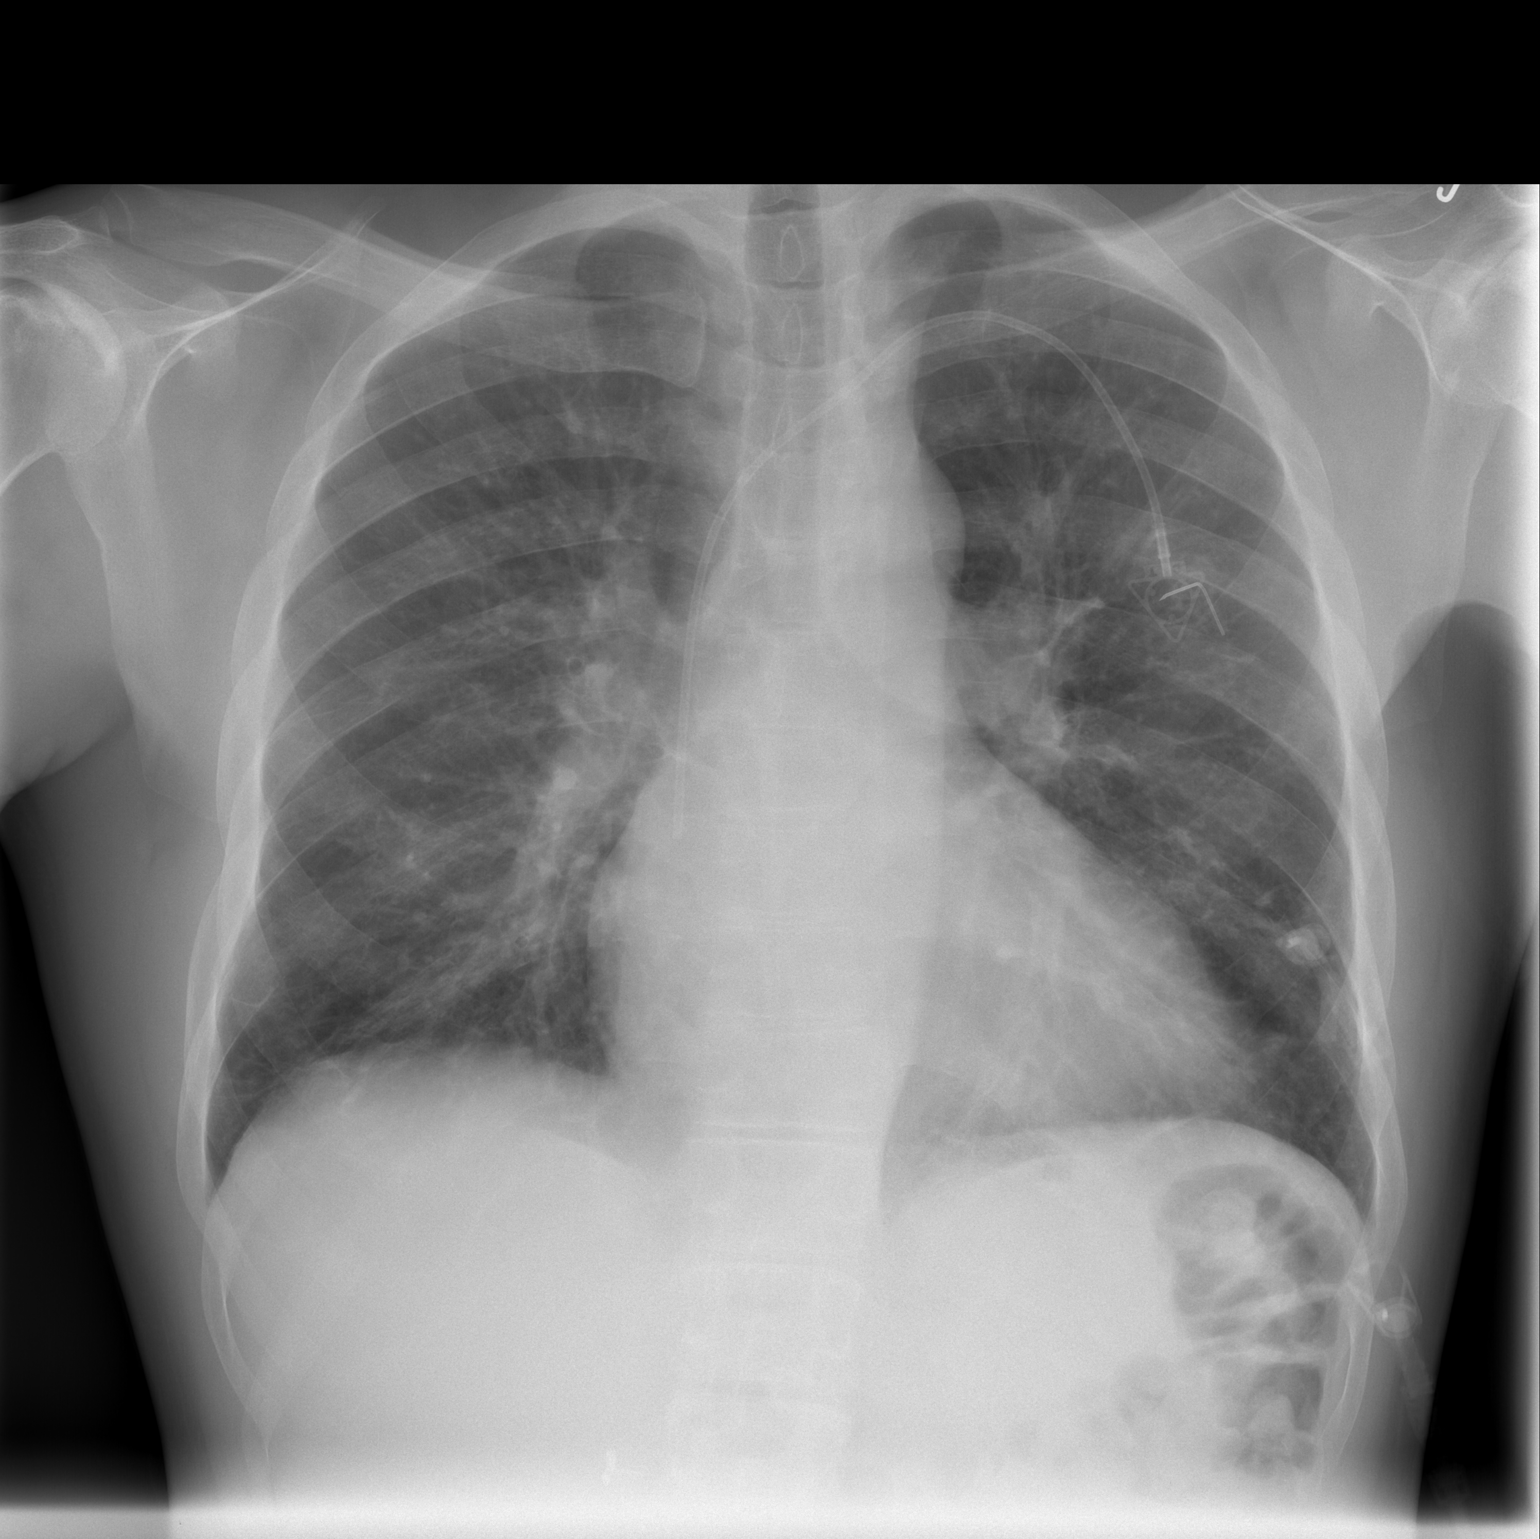

[w chest lat]
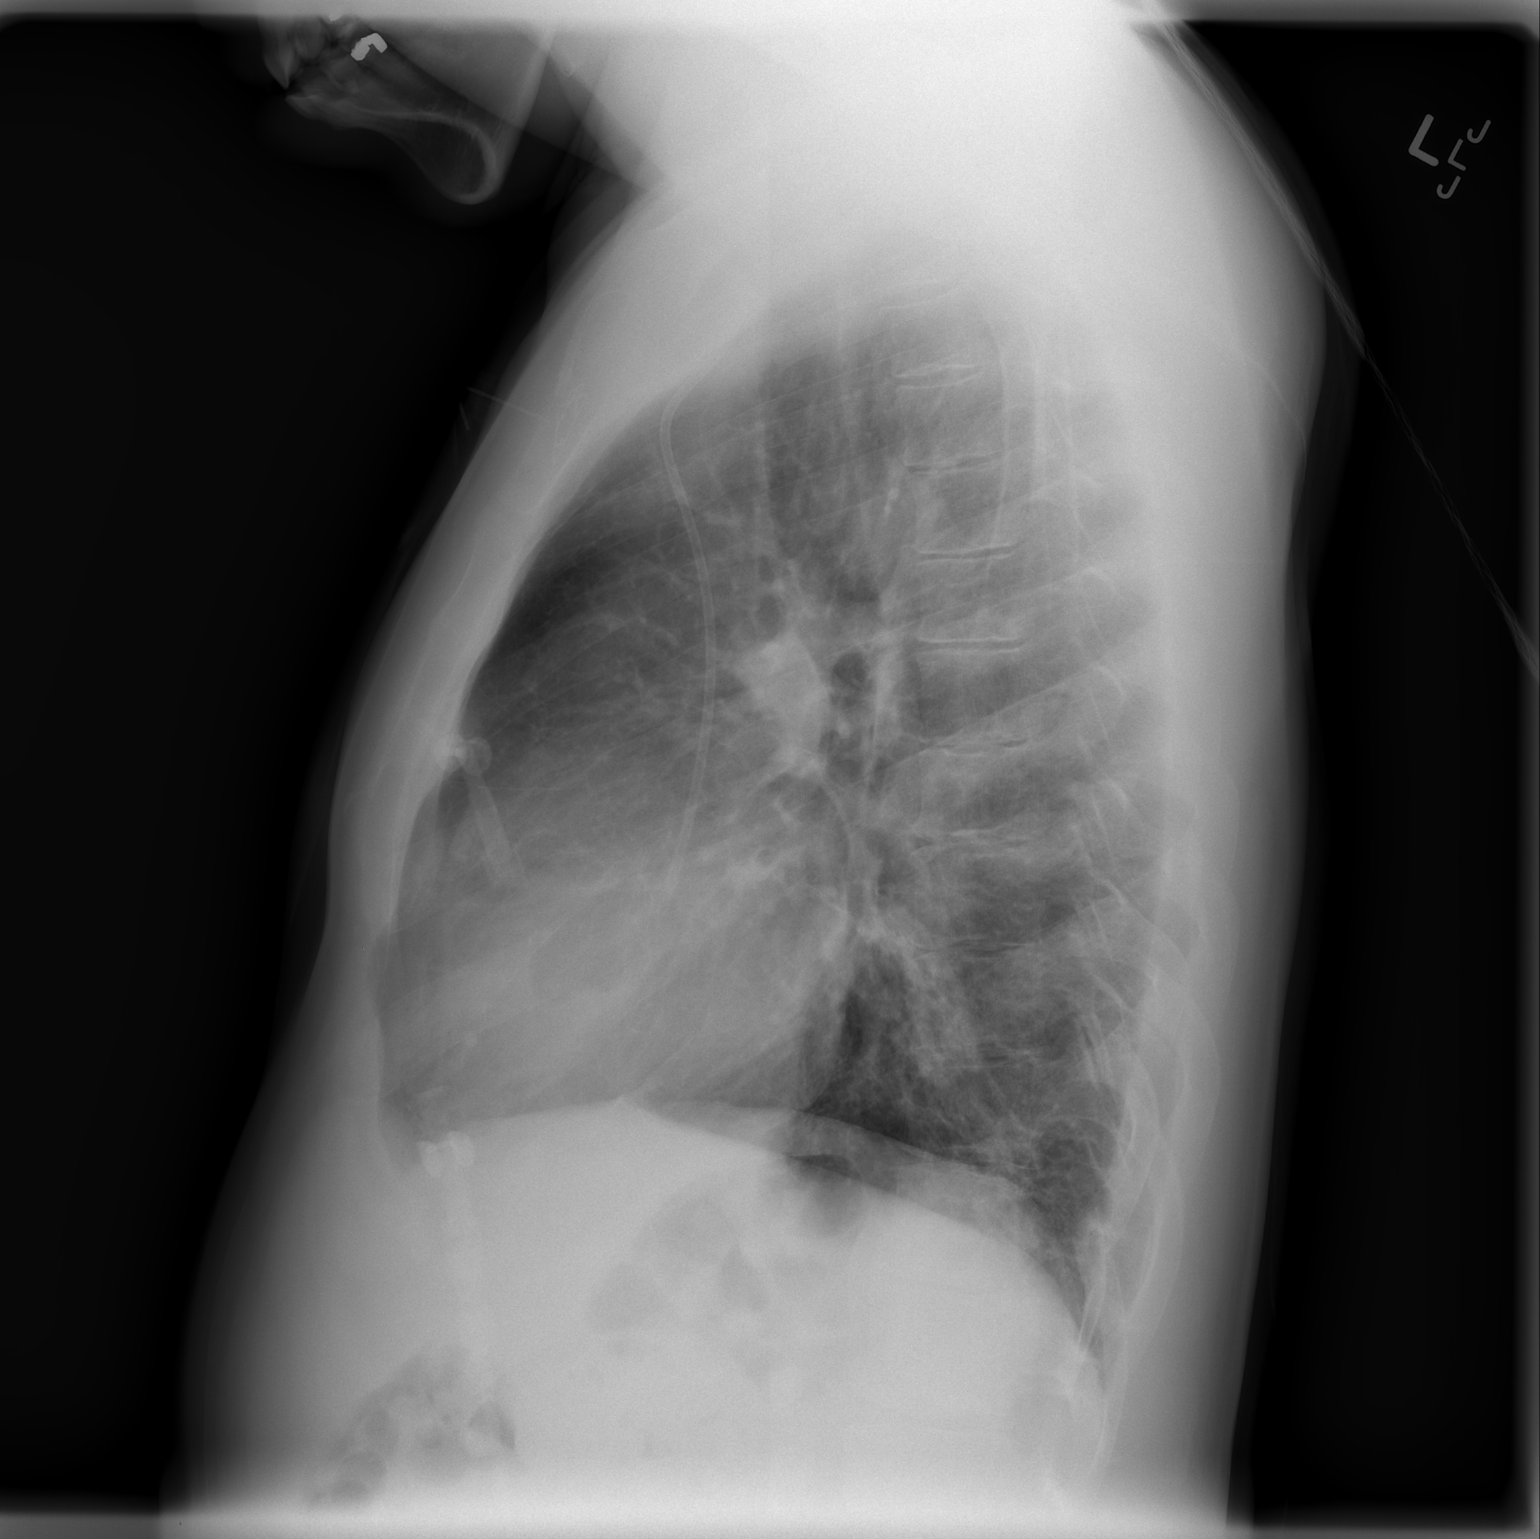

[2 of 2 positions shown; findings below may reference images not displayed]

FINDINGS: No active infiltrate or effusion is seen. Indistinct perihilar
vasculature may indicate very minimal pulmonary vascular congestion.
The heart is mildly enlarged and stable. A left-sided Port-A-Cath
tip remains at the expected SVC-RA junction.
IMPRESSION: Stable mild cardiomegaly with minimal pulmonary vascular congestion.

## 2016-02-13 MED ORDER — MORPHINE SULFATE ER 15 MG PO TBCR
15.0000 mg | EXTENDED_RELEASE_TABLET | Freq: Two times a day (BID) | ORAL | Status: DC
  Administered 2016-02-13 – 2016-02-16 (×7): 15 mg via ORAL
  Filled 2016-02-13 (×7): qty 1

## 2016-02-13 MED ORDER — HEPARIN (PORCINE) IN NACL 100-0.45 UNIT/ML-% IJ SOLN
1600.0000 [IU]/h | INTRAMUSCULAR | Status: DC
  Administered 2016-02-13: 1600 [IU]/h via INTRAVENOUS

## 2016-02-13 MED ORDER — AMBRISENTAN 5 MG PO TABS
5.0000 mg | ORAL_TABLET | Freq: Every day | ORAL | Status: DC
  Filled 2016-02-13: qty 1

## 2016-02-13 MED ORDER — HEPARIN (PORCINE) IN NACL 100-0.45 UNIT/ML-% IJ SOLN
1400.0000 [IU]/h | INTRAMUSCULAR | Status: DC
  Filled 2016-02-13: qty 250

## 2016-02-13 MED ORDER — HEPARIN (PORCINE) IN NACL 100-0.45 UNIT/ML-% IJ SOLN
1900.0000 [IU]/h | INTRAMUSCULAR | Status: DC
  Administered 2016-02-14: 1900 [IU]/h via INTRAVENOUS
  Filled 2016-02-13: qty 250

## 2016-02-13 MED ORDER — SODIUM CHLORIDE 0.9 % IV SOLN
Freq: Once | INTRAVENOUS | Status: AC
  Administered 2016-02-13: 17:00:00 via INTRAVENOUS

## 2016-02-13 MED ORDER — ZOLPIDEM TARTRATE 5 MG PO TABS
5.0000 mg | ORAL_TABLET | Freq: Every evening | ORAL | Status: DC | PRN
  Administered 2016-02-13 – 2016-02-15 (×3): 5 mg via ORAL
  Filled 2016-02-13 (×3): qty 1

## 2016-02-13 MED ORDER — FOLIC ACID 1 MG PO TABS
1.0000 mg | ORAL_TABLET | Freq: Every day | ORAL | Status: DC
  Administered 2016-02-13 – 2016-02-16 (×4): 1 mg via ORAL
  Filled 2016-02-13 (×4): qty 1

## 2016-02-13 MED ORDER — ASPIRIN 81 MG PO CHEW
81.0000 mg | CHEWABLE_TABLET | Freq: Every day | ORAL | Status: DC
  Administered 2016-02-13 – 2016-02-16 (×4): 81 mg via ORAL
  Filled 2016-02-13 (×4): qty 1

## 2016-02-13 NOTE — Progress Notes (Signed)
ANTICOAGULATION CONSULT NOTE - f/u Consult  Pharmacy Consult for heparin Indication: hx recurrent pulmonary embolus (home xarelto on hold)  No Known Allergies  Patient Measurements: Height: 6' (182.9 cm) Weight: 183 lb (83 kg) IBW/kg (Calculated) : 77.6 Heparin Dosing Weight: 81 kg  Vital Signs: Temp: 98.5 F (36.9 C) (01/12 1800) Temp Source: Oral (01/12 1800) BP: 106/55 (01/12 1800) Pulse Rate: 108 (01/12 1800)  Labs:  Recent Labs  02/11/16 1006 02/11/16 1352  02/11/16 2142 02/12/16 0501 02/12/16 0907 02/12/16 0933 02/12/16 2321  HGB 7.3*  --   --   --  6.7*  --   --   --   HCT 21.8*  --   --   --  20.2*  --   --   --   PLT 503*  --   --   --  469*  --   --   --   APTT  --   --   < >  --  66*  --  60* 65*  HEPARINUNFRC  --   --   < >  --  1.50*  --  1.34* 0.66  CREATININE 7.20* 7.18*  --  7.62*  --  6.20*  --   --   < > = values in this interval not displayed.  Estimated Creatinine Clearance: 18.1 mL/min (by C-G formula based on SCr of 6.2 mg/dL (H)).   Medical History: Past Medical History:  Diagnosis Date  . Acute chest syndrome ([place]) 06/18/2013  . Acute embolism and thrombosis of right internal jugular vein ([place])   . Alcohol consumption of one to four drinks per day   . Avascular necrosis (HCC)    Right Hip  . Blood transfusion   . Chronic anticoagulation   . Demand ischemia ([place]) 01/02/2014  . Former smoker   . Functional asplenia   . Hb-SS disease with crisis ([place])   . History of Clostridium difficile infection   . History of pulmonary embolus (PE)   . Hypertension   . Hypokalemia   . Leukocytosis    Chronic  . Mood disorder ([place])   . Noncompliance with medication regimen   . Oxygen deficiency   . Pulmonary hypertension   . Second hand tobacco smoke exposure   . Sickle cell anemia (HCC)   . Sickle-cell crisis with associated acute chest syndrome ([place]) 05/13/2013  . Stroke ([place])   . Thrombocytosis (HCC)    Chronic  . Uses marijuana      Medications:  xarelto 20 mg daily PTA (last dose taken on 02/10/16 at 2000)   Assessment: Patient is a 36 y.o M with hx sickle cell disease and recurrent PEs on xarelto PTA,who was recently hospitalized and discharged on 02/09/16, presented to the ED on 02/11/16 with c/o rib and back pain. Patient was found to have AKI on admission. To start heparin while xarelto is on hold.  Baseline labs - scr 7.18 (crcl~15), scr was 0.87 on 02/07/16 - aPTT at 8PM = 44 secs - heparin level 2.20 (affected by xarelto)  1/12  APTT now subtherapeutic slightly while the heparin level as expected continues to be supratherapeutic and likely will be for quite a few days as long as patient in ARF due to Xarelto not being able to be eliminated efficiently  2321 HL=0.66 and aPtt=65 aptt below goal no infusion or bleeding issues per RN  Goal of Therapy:  Heparin level 0.3-0.7 units/ml aPTT 66-102 sec seconds Monitor platelets by anticoagulation protocol: Yes  Plan:  1) Increase IV heparin to 1400 units/hr 2) Recheck aPTT and heparin level 6 hours after rate increase 3) Daily CBC and aPTT/HL    Dorrene German 02/13/2016 12:15 AM

## 2016-02-13 NOTE — Progress Notes (Signed)
SICKLE CELL SERVICE PROGRESS NOTE  [name] [id number] DOB: February 02, 1980 DOA: 02/11/2016 PCP: [name], MD  Assessment/Plan: Active Problems:   Acute renal failure (ARF) ([place])  1. Acute Renal Failure: Pt's renal function has made significant improvemenet with hydration. Will continue hydration and re-check renal function tomorrow. Continue to hold nephrotoxic agents. 2. Hb SS with crisis: Pt reports pain as 6/10. Will continue low dose Dilaudid PCA and start MS Contin  15 mg BID.   3. Leukocytosis: Significantly improved with IV hydration.  No evidence of infection.  4. Anemia of chronic disease. Pt 's Hb down to 5.8 g/dL. Will transfuse 1 unit RBC's.  5. Chronic pain: Ms Contin restarted at 15 mg BID.   6. Chronic Hypoxic Respiratory failure: At baseline of Oxygen supplementation.  7. Chronic anticoagulation: Due to renal function. Xarelto on hold and pt on IV Heparin. If renal function further improved tomorrow will consider resuming Xarelto and discontinuing IV Heparin. 8. Abnormal EKG: Repeat EKG today and make decision about discontinuing Telemtery.  Resume Aspirin. 9. Pulmonary Hypertension: Resume Ambrisentan. Continue oxygen supplementation.   Code Status: Full Code Family Communication: N/A Disposition Plan: Stable. Transfer to med-surg unit.  [name] A.  Pager [phone]. If 7PM-7AM, please contact night-coverage.  02/13/2016, 3:05 PM  LOS: 2 days   Interim  History: Pt reports that he feels better and pain in ribs and back at a level of 6/10. He has used 14.4 mg with 37/36: demands/deliveries in the last 24 hours. Last BM today. Pt requesting a regular diet but will continue renal diet until renal function normalizes as potassium maginaly elevated  Consultants:  Nephrology  Procedures:  None  Antibiotics:  None   Objective: Vitals:   02/13/16 0400 02/13/16 0500 02/13/16 0800 02/13/16 1217  BP:  (!) 93/56    Pulse:  78    Resp: 12   11 14   Temp:  98.2 F (36.8 C)    TempSrc:  Oral    SpO2: 93% 99% 96% 100%  Weight:  82 kg (180 lb 12.4 oz)    Height:       Weight change: 1.361 kg (3 lb)  Intake/Output Summary (Last 24 hours) at 02/13/16 1505 Last data filed at 02/13/16 1125  Gross per 24 hour  Intake               20 ml  Output             3025 ml  Net            -3005 ml    General: Alert, awake, oriented x3, in moderate distress due to pain.  HEENT: [place]/AT PEERL, EOMI, anicteric Neck: Trachea midline,  no masses, no thyromegal,y no JVD, no carotid bruit OROPHARYNX:  Moist, No exudate/ erythema/lesions.  Heart: Regular rate and rhythm, without murmurs, rubs, gallops, PMI non-displaced, no heaves or thrills on palpation.  Lungs: Clear to auscultation, no wheezing or rhonchi noted. No increased vocal fremitus resonant to percussion  Abdomen: Soft, nontender, nondistended, positive bowel sounds, no masses no hepatosplenomegaly noted.  Neuro: No focal neurological deficits noted cranial nerves II through XII grossly intact.  Strength at functional baseline in bilateral upper and lower extremities. Musculoskeletal: No warmth swelling or erythema around joints, no spinal tenderness noted. Psychiatric: Patient alert and oriented x3, good insight and cognition, good recent to remote recall.    Data Reviewed: Basic Metabolic Panel:  Recent Labs Lab 02/11/16 1006 02/11/16 1352 02/11/16 2142 02/12/16 0907 02/13/16  0407  NA 135 133* 134* 132* 139  K 5.2* 5.7* 5.3* 5.4* 5.5*  CL 104 102 104 104 112*  CO2 19* 20* 21* 19* 21*  GLUCOSE 153* 146* 139* 130* 120*  BUN 50* 55* 59* 64* 43*  CREATININE 7.20* 7.18* 7.62* 6.20* 1.90*  CALCIUM 8.8* 8.7* 8.6* 8.3* 8.2*  PHOS  --   --   --  6.6* 4.6   Liver Function Tests:  Recent Labs Lab 02/07/16 0502 02/11/16 1006 02/12/16 0907 02/13/16 0407  AST 43* 46* 43*  --   ALT 25 25 23   --   ALKPHOS 109 102 89  --   BILITOT 4.0* 5.0* 3.2*  --   PROT 6.9 8.0 7.1  --    ALBUMIN 3.8 4.6 4.2 3.8   No results for input(s): LIPASE, AMYLASE in the last 168 hours. No results for input(s): AMMONIA in the last 168 hours. CBC:  Recent Labs Lab 02/07/16 0502 02/11/16 1006 02/12/16 0501 02/13/16 0407  WBC 9.6 23.9* 22.2* 12.5*  NEUTROABS 5.0 18.4* 15.8* 7.9*  HGB 7.3* 7.3* 6.7* 5.8*  HCT 21.0* 21.8* 20.2* 17.5*  MCV 87.9 86.5 86.3 87.1  PLT 394 503* 469* 409*   Cardiac Enzymes: No results for input(s): CKTOTAL, CKMB, CKMBINDEX, TROPONINI in the last 168 hours. BNP (last 3 results)  Recent Labs  08/02/15 2307 11/28/15 0520 02/11/16 1006  BNP 503.2* 422.5* 381.0*    ProBNP (last 3 results) No results for input(s): PROBNP in the last 8760 hours.  CBG: No results for input(s): GLUCAP in the last 168 hours.  Recent Results (from the past 240 hour(s))  Blood culture (routine x 2)     Status: None (Preliminary result)   Collection Time: 02/11/16 10:06 AM  Result Value Ref Range Status   Specimen Description BLOOD PORTA CATH  Final   Special Requests BOTTLES DRAWN AEROBIC AND ANAEROBIC 5ML  Final   Culture   Final    NO GROWTH 2 DAYS Performed at [facility]    Report Status PENDING  Incomplete  Blood culture (routine x 2)     Status: None (Preliminary result)   Collection Time: 02/11/16 11:30 AM  Result Value Ref Range Status   Specimen Description BLOOD RIGHT HAND  Final   Special Requests IN PEDIATRIC BOTTLE .5CC  Final   Culture   Final    NO GROWTH 2 DAYS Performed at [facility]    Report Status PENDING  Incomplete  MRSA PCR Screening     Status: None   Collection Time: 02/11/16  9:48 PM  Result Value Ref Range Status   MRSA by PCR NEGATIVE NEGATIVE Final    Comment:        The GeneXpert MRSA Assay (FDA approved for NASAL specimens only), is one component of a comprehensive MRSA colonization surveillance program. It is not intended to diagnose MRSA infection nor to guide or monitor treatment for MRSA  infections.      Studies: Dg Chest 2 View  Result Date: 01/31/2016 CLINICAL DATA:  Bilateral rib pain. History of hypertension, chronic pulmonary embolism and sickle cell anemia. EXAM: CHEST  2 VIEW COMPARISON:  Chest radiograph January 27, 2016 and CT chest January 05, 2016 FINDINGS: Cardiac silhouette is moderately enlarged and unchanged. Mediastinal silhouette is nonsuspicious. Pulmonary vascular congestion without pleural effusion or focal consolidation. RIGHT mid and lower lung zone atelectasis/scarring. No pneumothorax. Single lumen LEFT chest Port-A-Cath. Surgical clips in the included right abdomen compatible with cholecystectomy. Osseous  structures are nonacute. Focal sclerosis in the humeral head compatible with infarcts. IMPRESSION: Stable cardiomegaly and pulmonary vascular congestion. RIGHT atelectasis/scarring. Electronically Signed   By: Elon Alas M.D.   On: 01/31/2016 04:46   US Renal  Result Date: 02/11/2016 CLINICAL DATA:  Renal failure.  Sickle cell disease EXAM: RENAL / URINARY TRACT ULTRASOUND COMPLETE COMPARISON:  CT abdomen and pelvis December 15, 2015 FINDINGS: Right Kidney: Length: 11.5 cm. Echogenicity and renal cortical thickness are within normal limits. No mass, perinephric fluid, or hydronephrosis visualized. No sonographically demonstrable calculus or ureterectasis. Left Kidney: Length: 11.7 cm. Echogenicity and renal cortical thickness are within normal limits. No mass, perinephric fluid, or hydronephrosis visualized. No sonographically demonstrable calculus or ureterectasis. Bladder: Appears normal for degree of bladder distention. IMPRESSION: Study within normal limits. Electronically Signed   By: Lowella Grip III M.D.   On: 02/11/2016 15:20   Dg Chest Portable 1 View  Result Date: 02/11/2016 CLINICAL DATA:  Sickle cell anemia.  Hypoxia. EXAM: PORTABLE CHEST 1 VIEW COMPARISON:  01/31/2016. FINDINGS: PowerPort catheter with lead tip projected over right  atrium. Cardiomegaly with normal pulmonary vascularity. No focal infiltrate. No pleural effusion or pneumothorax. IMPRESSION: 1. PowerPort catheter noted with lead tip projected over right atrium. 2. Cardiomegaly with pulmonary vascular prominence and bilateral interstitial prominence consistent with mild congestive heart failure. Electronically Signed   By: Marcello Moores  Register   On: 02/11/2016 10:22    Scheduled Meds: . sodium chloride   Intravenous Once  . ambrisentan  5 mg Oral QHS  . folic acid  1 mg Oral Daily  . HYDROmorphone   Intravenous Q4H  . HYDROmorphone  2 mg Oral Once  . morphine  15 mg Oral Q12H  . sodium chloride flush  3 mL Intravenous Q12H   Continuous Infusions: . sodium chloride 150 mL/hr at 02/13/16 1126  . heparin 1,400 Units/hr (02/13/16 0024)    Active Problems:   Acute renal failure (ARF) (HCC)     In excess of 35 minutes spent during this visit. Greater than 50% involved face to face contact with the patient for assessment, counseling and coordination of care.

## 2016-02-13 NOTE — Progress Notes (Signed)
  [facility] Progress Note   Subjective: creat down to 1.7 today, good UOP. K 5.5  Vitals:   02/13/16 1400 02/13/16 1633 02/13/16 1728 02/13/16 1756  BP: 110/61  109/64 (!) 106/54  Pulse: (!) 110  75 79  Resp: 16 14 15 14   Temp: 98.5 F (36.9 C)  99.3 F (37.4 C) 98.1 F (36.7 C)  TempSrc: Oral  Oral Oral  SpO2: 100% 97% 96% 93%  Weight:      Height:        Inpatient medications: . ambrisentan  5 mg Oral QHS  . aspirin  81 mg Oral Daily  . folic acid  1 mg Oral Daily  . HYDROmorphone   Intravenous Q4H  . HYDROmorphone  2 mg Oral Once  . morphine  15 mg Oral Q12H  . sodium chloride flush  3 mL Intravenous Q12H   . sodium chloride Stopped (02/13/16 1740)  . heparin 1,600 Units/hr (02/13/16 1631)   diphenhydrAMINE, naloxone **AND** sodium chloride flush, ondansetron (ZOFRAN) IV, sodium chloride flush  Exam: Gen no distress No jvd or bruits Chest clear bilat  RRR no MRG Abd soft ntnd no mass or ascites +bs Ext no LE edema / no wounds or ulcers Neuro is alert, Ox 3 , nf  UA - mod Hb, 30 prot, no glucose, 0-5 rbc/ wbc Renal US - normal kidneys  Assessment: 1  Acute renal failure - in patient w painful crisis, vol depletion on ACEi and hx pulm HTN.  Suspect all hemodynamic renal failure.  No evidence of Fanconi's so resuming Fe chelator at some point in the future should be OK.  Would consider avoiding ACEi/ ARB for BP management in this patient given his pulm HTN which can lead to tendency towards AKI in setting of vol depletion. No other suggestions. Will sign off.  3  Sickle cell disease/ acute painful crisis 3  Pulm HTN  4  Hx PE on Xarelto 5  Hx AVF R THR   Plan - as above   Kelly Splinter MD [facility] pager [phone]   02/13/2016, 6:28 PM    Recent Labs Lab 02/11/16 2142 02/12/16 0907 02/13/16 0407  NA 134* 132* 139  K 5.3* 5.4* 5.5*  CL 104 104 112*  CO2 21* 19* 21*  GLUCOSE 139* 130* 120*  BUN 59* 64* 43*   CREATININE 7.62* 6.20* 1.90*  CALCIUM 8.6* 8.3* 8.2*  PHOS  --  6.6* 4.6    Recent Labs Lab 02/07/16 0502 02/11/16 1006 02/12/16 0907 02/13/16 0407  AST 43* 46* 43*  --   ALT 25 25 23   --   ALKPHOS 109 102 89  --   BILITOT 4.0* 5.0* 3.2*  --   PROT 6.9 8.0 7.1  --   ALBUMIN 3.8 4.6 4.2 3.8    Recent Labs Lab 02/11/16 1006 02/12/16 0501 02/13/16 0407  WBC 23.9* 22.2* 12.5*  NEUTROABS 18.4* 15.8* 7.9*  HGB 7.3* 6.7* 5.8*  HCT 21.8* 20.2* 17.5*  MCV 86.5 86.3 87.1  PLT 503* 469* 409*   Iron/TIBC/Ferritin/ %Sat    Component Value Date/Time   IRON 157 (H) 09/22/2013 0813   TIBC NOT CALC 09/22/2013 0813   FERRITIN 1,896 (H) 07/07/2014 1458   IRONPCTSAT NOT CALC 09/22/2013 0813

## 2016-02-13 NOTE — Progress Notes (Signed)
ANTICOAGULATION CONSULT NOTE - Follow Up Consult  Pharmacy Consult for Heparin Indication: Xarelto held, recurrent PE  No Known Allergies  Patient Measurements: Height: 6' (182.9 cm) Weight: 180 lb 12.4 oz (82 kg) IBW/kg (Calculated) : 77.6 Heparin Dosing Weight: 81 kg  Vital Signs: Temp: 99.7 F (37.6 C) (01/13 2029) Temp Source: Oral (01/13 2029) BP: 108/60 (01/13 2029) Pulse Rate: 78 (01/13 2029)  Labs:  Recent Labs  02/11/16 1006  02/11/16 2142 02/12/16 0501 02/12/16 0907  02/12/16 2321 02/13/16 0407 02/13/16 0753 02/13/16 1355 02/13/16 2221  HGB 7.3*  --   --  6.7*  --   --   --  5.8*  --   --   --   HCT 21.8*  --   --  20.2*  --   --   --  17.5*  --   --   --   PLT 503*  --   --  469*  --   --   --  409*  --   --   --   APTT  --   < >  --  66*  --   < > 65*  --  80* 58*  --   HEPARINUNFRC  --   < >  --  1.50*  --   < > 0.66  --  0.36 0.25* 0.14*  CREATININE 7.20*  < > 7.62*  --  6.20*  --   --  1.90*  --   --   --   < > = values in this interval not displayed.  Estimated Creatinine Clearance: 59 mL/min (by C-G formula based on SCr of 1.9 mg/dL (H)).   Medications:  Infusions:  . sodium chloride 100 mL/hr at 02/13/16 2104  . 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 Patient was found to have AKI on admission. Pharmacy is consulted to dose heparin while xarelto is on hold.  Baseline labs  Scr 7.18 (crcl~15), scr was 0.87 on 02/07/16  aPTT at 8PM = 44 secs  heparin level 2.20 (affected by Xarelto, last dose taken 02/10/16 at 2000)   Today, 02/13/2016:  APTT 80, therapeutic  Heparin level: 0.36, therapeutic, expect that Xarelto is being cleared and affects on HL are diminishing.     CBC: Hgb 5.8 is low and decreased (baseline ~6-7), Plt remain above WNL  No blleeding or IV  complications reported.  SCr significantly improved (7.2 >> 1.9) with CrCl ~ 59 ml/min  2221 HL=0.14  Below goal, no infusion or bleeding issues per RN  Goal of Therapy:  Heparin level 0.3-0.7 units/ml aPTT 66-102 seconds Monitor platelets by anticoagulation protocol: Yes   Plan:   Increase heparin IV infusion to 1900 units/hr.  Rechck HL in 6 hours  Daily heparin level and CBC.  Continue to monitor for s/s bleeding or thrombosis.   Dorrene German 02/13/2016 11:38 PM

## 2016-02-13 NOTE — Progress Notes (Addendum)
ANTICOAGULATION CONSULT NOTE - Follow Up Consult  Pharmacy Consult for Heparin Indication: Xarelto held, recurrent PE  No Known Allergies  Patient Measurements: Height: 6' (182.9 cm) Weight: 180 lb 12.4 oz (82 kg) IBW/kg (Calculated) : 77.6 Heparin Dosing Weight: 81 kg  Vital Signs: Temp: 98.2 F (36.8 C) (01/13 0500) Temp Source: Oral (01/13 0500) BP: 93/56 (01/13 0500) Pulse Rate: 78 (01/13 0500)  Labs:  Recent Labs  02/11/16 1006  02/11/16 2142 02/12/16 0501 02/12/16 0907 02/12/16 0933 02/12/16 2321 02/13/16 0407 02/13/16 0753  HGB 7.3*  --   --  6.7*  --   --   --  5.8*  --   HCT 21.8*  --   --  20.2*  --   --   --  17.5*  --   PLT 503*  --   --  469*  --   --   --  409*  --   APTT  --   < >  --  66*  --  60* 65*  --  80*  HEPARINUNFRC  --   < >  --  1.50*  --  1.34* 0.66  --  0.36  CREATININE 7.20*  < > 7.62*  --  6.20*  --   --  1.90*  --   < > = values in this interval not displayed.  Estimated Creatinine Clearance: 59 mL/min (by C-G formula based on SCr of 1.9 mg/dL (H)).   Medications:  Infusions:  . sodium chloride 150 mL/hr at 02/13/16 0127  . 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 Patient was found to have AKI on admission. Pharmacy is consulted to dose heparin while xarelto is on hold.  Baseline labs  Scr 7.18 (crcl~15), scr was 0.87 on 02/07/16  aPTT at 8PM = 44 secs  heparin level 2.20 (affected by Xarelto, last dose taken 02/10/16 at 2000)   Today, 02/13/2016:  APTT 80, therapeutic  Heparin level: 0.36, therapeutic, expect that Xarelto is being cleared and affects on HL are diminishing.     CBC: Hgb 5.8 is low and decreased (baseline ~6-7), Plt remain above WNL  No blleeding or IV complications reported.  SCr significantly improved (7.2  >> 1.9) with CrCl ~ 59 ml/min  Goal of Therapy:  Heparin level 0.3-0.7 units/ml aPTT 66-102 seconds Monitor platelets by anticoagulation protocol: Yes   Plan:   Continue heparin IV infusion at 1400 units/hr.  Heparin level in 6 hours to confirm therapeutic labs.  Daily heparin level and CBC.  Continue to monitor for s/s bleeding or thrombosis.   Gretta Arab PharmD, BCPS Pager [phone] 02/13/2016 8:50 AM   Addendum: Recheck HL = 0.25, APTT = 58, decreased to subtherapeutic. Plan: Increase to heparin IV infusion at 1600 units/hr Heparin level 6 hours after rate change.  Not checking further APTT since correlating with heparin level.  Gretta Arab PharmD, BCPS Pager [phone] 02/13/2016 3:47 PM

## 2016-02-14 LAB — RENAL FUNCTION PANEL
Albumin: 3.6 g/dL (ref 3.5–5.0)
Anion gap: 3 — ABNORMAL LOW (ref 5–15)
BUN: 19 mg/dL (ref 6–20)
CALCIUM: 8.3 mg/dL — AB (ref 8.9–10.3)
CO2: 19 mmol/L — AB (ref 22–32)
CREATININE: 0.9 mg/dL (ref 0.61–1.24)
Chloride: 116 mmol/L — ABNORMAL HIGH (ref 101–111)
GFR calc Af Amer: >60 mL/min (ref >60)
GFR calc non Af Amer: >60 mL/min (ref >60)
GLUCOSE: 98 mg/dL (ref 65–99)
Phosphorus: 3.2 mg/dL (ref 2.5–4.6)
Potassium: 4.5 mmol/L (ref 3.5–5.1)
SODIUM: 138 mmol/L (ref 135–145)

## 2016-02-14 LAB — CBC WITH DIFFERENTIAL/PLATELET
BASOS PCT: 1 %
Basophils Absolute: 0.1 10*3/uL (ref 0.0–0.1)
EOS ABS: 0.5 10*3/uL (ref 0.0–0.7)
EOS PCT: 5 %
HCT: 19.1 % — ABNORMAL LOW (ref 39.0–52.0)
Hemoglobin: 6.4 g/dL — CL (ref 13.0–17.0)
LYMPHS ABS: 1.8 10*3/uL (ref 0.7–4.0)
Lymphocytes Relative: 15 %
MCH: 28.8 pg (ref 26.0–34.0)
MCHC: 33.5 g/dL (ref 30.0–36.0)
MCV: 86 fL (ref 78.0–100.0)
MONOS PCT: 17 %
Monocytes Absolute: 1.9 10*3/uL — ABNORMAL HIGH (ref 0.1–1.0)
Neutro Abs: 7.1 10*3/uL (ref 1.7–7.7)
Neutrophils Relative %: 62 %
PLATELETS: 398 10*3/uL (ref 150–400)
RBC: 2.22 MIL/uL — AB (ref 4.22–5.81)
RDW: 18.9 % — ABNORMAL HIGH (ref 11.5–15.5)
WBC: 11.4 10*3/uL — AB (ref 4.0–10.5)

## 2016-02-14 LAB — TYPE AND SCREEN
Blood Product Expiration Date: 201801292359
ISSUE DATE / TIME: 201801131733
Unit Type and Rh: 5100

## 2016-02-14 LAB — HEPARIN LEVEL (UNFRACTIONATED): Heparin Unfractionated: 0.14 IU/mL — ABNORMAL LOW (ref 0.30–0.70)

## 2016-02-14 MED ORDER — HYDROMORPHONE HCL 4 MG PO TABS
4.0000 mg | ORAL_TABLET | ORAL | Status: DC
  Administered 2016-02-14 – 2016-02-16 (×10): 4 mg via ORAL
  Filled 2016-02-14 (×11): qty 1

## 2016-02-14 MED ORDER — RIVAROXABAN 20 MG PO TABS
20.0000 mg | ORAL_TABLET | Freq: Every day | ORAL | Status: DC
  Administered 2016-02-14 – 2016-02-16 (×3): 20 mg via ORAL
  Filled 2016-02-14 (×3): qty 1

## 2016-02-14 MED ORDER — HYDROMORPHONE 1 MG/ML IV SOLN
INTRAVENOUS | Status: DC
  Administered 2016-02-14: 6.6 mg via INTRAVENOUS
  Administered 2016-02-15: 6.5 mg via INTRAVENOUS
  Administered 2016-02-15: 5 mg via INTRAVENOUS
  Administered 2016-02-15: 14 mg via INTRAVENOUS
  Administered 2016-02-15: 13:00:00 via INTRAVENOUS
  Administered 2016-02-15: 5 mg via INTRAVENOUS
  Administered 2016-02-15: 5.5 mg via INTRAVENOUS
  Administered 2016-02-16: 1.5 mg via INTRAVENOUS
  Administered 2016-02-16: 3.5 mg via INTRAVENOUS
  Administered 2016-02-16: 1.5 mg via INTRAVENOUS
  Filled 2016-02-14 (×3): qty 25

## 2016-02-14 MED ORDER — HEPARIN (PORCINE) IN NACL 100-0.45 UNIT/ML-% IJ SOLN: 2400.0000 [IU]/h | INTRAMUSCULAR | Status: DC

## 2016-02-14 NOTE — Progress Notes (Signed)
SICKLE CELL SERVICE PROGRESS NOTE  [name] [id number] DOB: 1980-07-25 DOA: 02/11/2016 PCP: Gerald Chessman, MD  Assessment/Plan: Active Problems:   Acute renal failure (ARF) ([place])  1. Acute Renal Failure: Pt's renal function back to baseline. Will discontinue IV Fluids and check  renal function tomorrow. Continue to hold nephrotoxic agents. 2. Hb SS with crisis: Pt reports pain as 7/10. Will increase Dilaudid PCA bolus to 0.5 mg. Schedule oral dilaudid and continue MS Contin at 15 mg BID.   3. Leukocytosis: Significantly improved with IV hydration.  No evidence of infection.  4. Anemia of chronic disease. Pt 's Hb improved to 6.4 g/dL post transfusion 1 unit of blood. 5. Chronic pain: Continue MS Contin 15 mg BID.   6. Chronic Hypoxic Respiratory failure: At baseline of Oxygen supplementation.  7. Chronic anticoagulation: Renal function back to normal. Heparin discontinued and Xarelto restarted. 8. Abnormal EKG: Unchanged. DiscontiueTelemtery. Continue Aspirin. 9. Pulmonary Hypertension: Resume Ambrisentan. Continue oxygen supplementation.   Code Status: Full Code Family Communication: N/A Disposition Plan: Stable. Transfer to med-surg unit.  [name] A.  Pager [phone]. If 7PM-7AM, please contact night-coverage.  02/14/2016, 5:01 PM  LOS: 3 days   Interim  History: Pt reports that he feels better and pain in ribs and back at a level of7/10. Last BM today.    Consultants:  Nephrology  Procedures:  None  Antibiotics:  None   Objective: Vitals:   02/14/16 0800 02/14/16 1200 02/14/16 1235 02/14/16 1558  BP:   109/68   Pulse:   68   Resp: 16 18 14 16   Temp:   98.5 F (36.9 C)   TempSrc:   Oral   SpO2: 98% 98% 96% 98%  Weight:      Height:       Weight change: -0.008 kg (-0.3 oz)  Intake/Output Summary (Last 24 hours) at 02/14/16 1701 Last data filed at 02/14/16 1500  Gross per 24 hour  Intake          3673.61 ml  Output              4200 ml  Net          -526.39 ml    General: Alert, awake, oriented x3, in mild distress due to pain.  HEENT: [place]/AT PEERL, EOMI, anicteric Heart: Regular rate and rhythm, without murmurs, rubs, gallops, PMI non-displaced, no heaves or thrills on palpation.  Lungs: Clear to auscultation, no wheezing or rhonchi noted. No increased vocal fremitus resonant to percussion  Abdomen: Soft, nontender, nondistended, positive bowel sounds, no masses no hepatosplenomegaly noted.  Neuro: No focal neurological deficits noted cranial nerves II through XII grossly intact.  Strength at functional baseline in bilateral upper and lower extremities. Musculoskeletal: No warmth swelling or erythema around joints, no spinal tenderness noted. Psychiatric: Patient alert and oriented x3, good insight and cognition, good recent to remote recall.    Data Reviewed: Basic Metabolic Panel:  Recent Labs Lab 02/11/16 1352 02/11/16 2142 02/12/16 0907 02/13/16 0407 02/14/16 0530  NA 133* 134* 132* 139 138  K 5.7* 5.3* 5.4* 5.5* 4.5  CL 102 104 104 112* 116*  CO2 20* 21* 19* 21* 19*  GLUCOSE 146* 139* 130* 120* 98  BUN 55* 59* 64* 43* 19  CREATININE 7.18* 7.62* 6.20* 1.90* 0.90  CALCIUM 8.7* 8.6* 8.3* 8.2* 8.3*  PHOS  --   --  6.6* 4.6 3.2   Liver Function Tests:  Recent Labs Lab 02/11/16 1006 02/12/16 0907 02/13/16 0407 02/14/16  0530  AST 46* 43*  --   --   ALT 25 23  --   --   ALKPHOS 102 89  --   --   BILITOT 5.0* 3.2*  --   --   PROT 8.0 7.1  --   --   ALBUMIN 4.6 4.2 3.8 3.6   No results for input(s): LIPASE, AMYLASE in the last 168 hours. No results for input(s): AMMONIA in the last 168 hours. CBC:  Recent Labs Lab 02/11/16 1006 02/12/16 0501 02/13/16 0407 02/14/16 0530  WBC 23.9* 22.2* 12.5* 11.4*  NEUTROABS 18.4* 15.8* 7.9* 7.1  HGB 7.3* 6.7* 5.8* 6.4*  HCT 21.8* 20.2* 17.5* 19.1*  MCV 86.5 86.3 87.1 86.0  PLT 503* 469* 409* 398   Cardiac Enzymes: No results for input(s):  CKTOTAL, CKMB, CKMBINDEX, TROPONINI in the last 168 hours. BNP (last 3 results)  Recent Labs  08/02/15 2307 11/28/15 0520 02/11/16 1006  BNP 503.2* 422.5* 381.0*    ProBNP (last 3 results) No results for input(s): PROBNP in the last 8760 hours.  CBG: No results for input(s): GLUCAP in the last 168 hours.  Recent Results (from the past 240 hour(s))  Blood culture (routine x 2)     Status: None (Preliminary result)   Collection Time: 02/11/16 10:06 AM  Result Value Ref Range Status   Specimen Description BLOOD PORTA CATH  Final   Special Requests BOTTLES DRAWN AEROBIC AND ANAEROBIC 5ML  Final   Culture   Final    NO GROWTH 3 DAYS Performed at [facility]    Report Status PENDING  Incomplete  Blood culture (routine x 2)     Status: None (Preliminary result)   Collection Time: 02/11/16 11:30 AM  Result Value Ref Range Status   Specimen Description BLOOD RIGHT HAND  Final   Special Requests IN PEDIATRIC BOTTLE .5CC  Final   Culture   Final    NO GROWTH 3 DAYS Performed at [facility]    Report Status PENDING  Incomplete  MRSA PCR Screening     Status: None   Collection Time: 02/11/16  9:48 PM  Result Value Ref Range Status   MRSA by PCR NEGATIVE NEGATIVE Final    Comment:        The GeneXpert MRSA Assay (FDA approved for NASAL specimens only), is one component of a comprehensive MRSA colonization surveillance program. It is not intended to diagnose MRSA infection nor to guide or monitor treatment for MRSA infections.      Studies: Dg Chest 2 View  Result Date: 01/31/2016 CLINICAL DATA:  Bilateral rib pain. History of hypertension, chronic pulmonary embolism and sickle cell anemia. EXAM: CHEST  2 VIEW COMPARISON:  Chest radiograph January 27, 2016 and CT chest January 05, 2016 FINDINGS: Cardiac silhouette is moderately enlarged and unchanged. Mediastinal silhouette is nonsuspicious. Pulmonary vascular congestion without pleural effusion or focal  consolidation. RIGHT mid and lower lung zone atelectasis/scarring. No pneumothorax. Single lumen LEFT chest Port-A-Cath. Surgical clips in the included right abdomen compatible with cholecystectomy. Osseous structures are nonacute. Focal sclerosis in the humeral head compatible with infarcts. IMPRESSION: Stable cardiomegaly and pulmonary vascular congestion. RIGHT atelectasis/scarring. Electronically Signed   By: Elon Alas M.D.   On: 01/31/2016 04:46   US Renal  Result Date: 02/11/2016 CLINICAL DATA:  Renal failure.  Sickle cell disease EXAM: RENAL / URINARY TRACT ULTRASOUND COMPLETE COMPARISON:  CT abdomen and pelvis December 15, 2015 FINDINGS: Right Kidney: Length: 11.5 cm. Echogenicity and  renal cortical thickness are within normal limits. No mass, perinephric fluid, or hydronephrosis visualized. No sonographically demonstrable calculus or ureterectasis. Left Kidney: Length: 11.7 cm. Echogenicity and renal cortical thickness are within normal limits. No mass, perinephric fluid, or hydronephrosis visualized. No sonographically demonstrable calculus or ureterectasis. Bladder: Appears normal for degree of bladder distention. IMPRESSION: Study within normal limits. Electronically Signed   By: Lowella Grip III M.D.   On: 02/11/2016 15:20   Dg Chest Portable 1 View  Result Date: 02/11/2016 CLINICAL DATA:  Sickle cell anemia.  Hypoxia. EXAM: PORTABLE CHEST 1 VIEW COMPARISON:  01/31/2016. FINDINGS: PowerPort catheter with lead tip projected over right atrium. Cardiomegaly with normal pulmonary vascularity. No focal infiltrate. No pleural effusion or pneumothorax. IMPRESSION: 1. PowerPort catheter noted with lead tip projected over right atrium. 2. Cardiomegaly with pulmonary vascular prominence and bilateral interstitial prominence consistent with mild congestive heart failure. Electronically Signed   By: Jackpot   On: 02/11/2016 10:22    Scheduled Meds: . ambrisentan  5 mg Oral QHS  .  aspirin  81 mg Oral Daily  . folic acid  1 mg Oral Daily  . HYDROmorphone   Intravenous Q4H  . HYDROmorphone  4 mg Oral Q4H  . morphine  15 mg Oral Q12H  . rivaroxaban  20 mg Oral Daily  . sodium chloride flush  3 mL Intravenous Q12H   Continuous Infusions: . sodium chloride 10 mL/hr at 02/14/16 1001    Active Problems:   Acute renal failure (ARF) ([facility])     In excess of 35 minutes spent during this visit. Greater than 50% involved face to face contact with the patient for assessment, counseling and coordination of care.

## 2016-02-14 NOTE — Progress Notes (Signed)
ANTICOAGULATION CONSULT NOTE - Follow Up Consult  Pharmacy Consult for Xarelto Indication: recurrent PE  No Known Allergies  Patient Measurements: Height: 6' (182.9 cm) Weight: 182 lb 15.7 oz (83 kg) IBW/kg (Calculated) : 77.6 Heparin Dosing Weight: 81 kg  Vital Signs: Temp: 98.5 F (36.9 C) (01/14 0500) Temp Source: Oral (01/13 2348) BP: 108/50 (01/14 0500) Pulse Rate: 80 (01/13 2348)  Labs:  Recent Labs  02/12/16 0501 02/12/16 0907  02/12/16 2321 02/13/16 0407 02/13/16 0753 02/13/16 1355 02/13/16 2221 02/14/16 0530  HGB 6.7*  --   --   --  5.8*  --   --   --  6.4*  HCT 20.2*  --   --   --  17.5*  --   --   --  19.1*  PLT 469*  --   --   --  409*  --   --   --  398  APTT 66*  --   < > 65*  --  80* 58*  --   --   HEPARINUNFRC 1.50*  --   < > 0.66  --  0.36 0.25* 0.14* 0.14*  CREATININE  --  6.20*  --   --  1.90*  --   --   --  0.90  < > = values in this interval not displayed.  Estimated Creatinine Clearance: 124.5 mL/min (by C-G formula based on SCr of 0.9 mg/dL).   Medications:  Infusions:  . sodium chloride 100 mL/hr at 02/14/16 0244  . 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 Patient was found to have AKI on admission. Pharmacy was consulted to dose heparin while xarelto was held.  Pharmacy is now consulted 1/14 to resume Xarelto.  Baseline labs  Scr 7.18 (crcl~15), scr was 0.87 on 02/07/16  aPTT at 8PM = 44 secs  heparin level 2.20 (affected by Xarelto, last dose taken 02/10/16 at 2000)   Today, 02/14/2016:  Heparin level: 0.14, subtherapeutic  CBC: Hgb 6.4 is improved (baseline ~6-7), Plt WNL  No bleeding or IV complications reported.  SCr significantly improved (7.2 >> 0.9) with CrCl >100 ml/min  Goal of Therapy:  Heparin level 0.3-0.7  units/ml aPTT 66-102 seconds Monitor platelets by anticoagulation protocol: Yes   Plan:   Stop heparin IV infusion at 10:00  At 10:00 resume Xarelto 20mg  PO daily. Pharmacy will sign off at this time.  Please reconsult if a change in clinical status warrants re-evaluation of dosage.   Gretta Arab PharmD, BCPS Pager [phone] 02/14/2016 9:35 AM

## 2016-02-14 NOTE — Progress Notes (Signed)
ANTICOAGULATION CONSULT NOTE - Follow Up Consult  Pharmacy Consult for Heparin Indication: Xarelto held, recurrent PE  No Known Allergies  Patient Measurements: Height: 6' (182.9 cm) Weight: 182 lb 15.7 oz (83 kg) IBW/kg (Calculated) : 77.6 Heparin Dosing Weight: 81 kg  Vital Signs: Temp: 99.5 F (37.5 C) (01/13 2348) Temp Source: Oral (01/13 2348) BP: 95/54 (01/13 2348) Pulse Rate: 80 (01/13 2348)  Labs:  Recent Labs  02/12/16 0501 02/12/16 0907  02/12/16 2321 02/13/16 0407 02/13/16 0753 02/13/16 1355 02/13/16 2221 02/14/16 0530  HGB 6.7*  --   --   --  5.8*  --   --   --  6.4*  HCT 20.2*  --   --   --  17.5*  --   --   --  19.1*  PLT 469*  --   --   --  409*  --   --   --  398  APTT 66*  --   < > 65*  --  80* 58*  --   --   HEPARINUNFRC 1.50*  --   < > 0.66  --  0.36 0.25* 0.14* 0.14*  CREATININE  --  6.20*  --   --  1.90*  --   --   --  0.90  < > = values in this interval not displayed.  Estimated Creatinine Clearance: 124.5 mL/min (by C-G formula based on SCr of 0.9 mg/dL).   Medications:  Infusions:  . sodium chloride 100 mL/hr at 02/14/16 0244  . 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 Patient was found to have AKI on admission. Pharmacy is consulted to dose heparin while xarelto is on hold.  Baseline labs  Scr 7.18 (crcl~15), scr was 0.87 on 02/07/16  aPTT at 8PM = 44 secs  heparin level 2.20 (affected by Xarelto, last dose taken 02/10/16 at 2000)   1/13  APTT 80, therapeutic  Heparin level: 0.36, therapeutic, expect that Xarelto is being cleared and affects on HL are diminishing.     CBC: Hgb 5.8 is low and decreased (baseline ~6-7), Plt remain above WNL  No blleeding or IV complications reported.  SCr significantly improved (7.2 >> 1.9) with CrCl ~ 59 ml/min  2221  HL=0.14 , no infusion or bleeding issues per RN Today, 1/14  0530 HL=0.14 ? subtherapeutic, no issues or bleeding per RN   Goal of Therapy:  Heparin level 0.3-0.7 units/ml aPTT 66-102 seconds Monitor platelets by anticoagulation protocol: Yes   Plan:   Increase heparin IV infusion to 2400 units/hr.  Rechck HL in 6 hours  Daily heparin level and CBC.  Continue to monitor for s/s bleeding or thrombosis.   Dorrene German 02/14/2016 6:24 AM

## 2016-02-15 LAB — RENAL FUNCTION PANEL
ANION GAP: 5 (ref 5–15)
Albumin: 3.8 g/dL (ref 3.5–5.0)
BUN: 8 mg/dL (ref 6–20)
CHLORIDE: 107 mmol/L (ref 101–111)
CO2: 24 mmol/L (ref 22–32)
Calcium: 8.8 mg/dL — ABNORMAL LOW (ref 8.9–10.3)
Creatinine, Ser: 0.75 mg/dL (ref 0.61–1.24)
Glucose, Bld: 105 mg/dL — ABNORMAL HIGH (ref 65–99)
POTASSIUM: 4.2 mmol/L (ref 3.5–5.1)
Phosphorus: 4 mg/dL (ref 2.5–4.6)
Sodium: 136 mmol/L (ref 135–145)

## 2016-02-15 LAB — CBC WITH DIFFERENTIAL/PLATELET
Basophils Absolute: 0.2 10*3/uL — ABNORMAL HIGH (ref 0.0–0.1)
Basophils Relative: 2 %
EOS PCT: 4 %
Eosinophils Absolute: 0.4 10*3/uL (ref 0.0–0.7)
HEMATOCRIT: 19.9 % — AB (ref 39.0–52.0)
Hemoglobin: 6.9 g/dL — CL (ref 13.0–17.0)
LYMPHS ABS: 2.2 10*3/uL (ref 0.7–4.0)
Lymphocytes Relative: 20 %
MCH: 30.3 pg (ref 26.0–34.0)
MCHC: 34.7 g/dL (ref 30.0–36.0)
MCV: 87.3 fL (ref 78.0–100.0)
MONO ABS: 1.9 10*3/uL — AB (ref 0.1–1.0)
MONOS PCT: 17 %
NEUTROS ABS: 6.3 10*3/uL (ref 1.7–7.7)
Neutrophils Relative %: 57 %
PLATELETS: 421 10*3/uL — AB (ref 150–400)
RBC: 2.28 MIL/uL — ABNORMAL LOW (ref 4.22–5.81)
RDW: 19.4 % — AB (ref 11.5–15.5)
WBC: 11 10*3/uL — AB (ref 4.0–10.5)

## 2016-02-15 LAB — RETICULOCYTES
RBC.: 2.28 MIL/uL — ABNORMAL LOW (ref 4.22–5.81)
Retic Count, Absolute: 88.9 10*3/uL (ref 19.0–186.0)
Retic Ct Pct: 3.9 % — ABNORMAL HIGH (ref 0.4–3.1)

## 2016-02-15 NOTE — Care Management Important Message (Signed)
Important Message  Patient Details  Name: AMIER DIEDRICH MRN: [id number] Date of Birth: 07-Oct-1980   Medicare Important Message Given:  Yes    Kerin Salen 02/15/2016, 1:24 [address] Message  Patient Details  Name: OLGA NORSWORTHY MRN: [id number] Date of Birth: 10-Aug-1980   Medicare Important Message Given:  Yes    Kerin Salen 02/15/2016, 1:24 PM

## 2016-02-15 NOTE — Progress Notes (Signed)
SICKLE CELL SERVICE PROGRESS NOTE  [name] [id number] DOB: 1980/07/09 DOA: 02/11/2016 PCP: [name], MD  Assessment/Plan: Active Problems:   Acute renal failure (ARF) ([place])  1. Acute Renal Failure: Pt's renal function back to baseline. Will discontinue IV Fluids and check  renal function tomorrow. Continue to hold nephrotoxic agents. 2. Hb SS with crisis: Pt reports pain as 7/10. Continue Dilaudid PCA bolus at 0.5 mg. Continue scheduled oral dilaudid and continue MS Contin at 15 mg BID.   3. Leukocytosis: WBC at baseline. This was likely an indication of hemo-concentration and crisis.  4. Anemia of chronic disease. Pt 's Hb improved to 6.9 g/dL post transfusion 1 unit of blood. Resume Hydrea. 5. Chronic pain: Continue MS Contin 15 mg BID.   6. Chronic Hypoxic Respiratory failure: At baseline of Oxygen supplementation. Will have nurse check ambulation of 3 L/min and titrate to maintain saturations at 92-93%.  7. Chronic anticoagulation: Continue Xarelto 8. Secondary Hemochromatosis: Per Nephrology okay to resume Jadenu.  9. Abnormal EKG: Unchanged. DiscontiueTelemtery. Continue Aspirin. 10. Pulmonary Hypertension: Continue Ambrisentan. Continue oxygen supplementation.   Code Status: Full Code Family Communication: N/A Disposition Plan: Stable. Anticipate discharge home tomorrow  [place].  Pager [phone]. If 7PM-7AM, please contact night-coverage.  02/15/2016, 12:20 PM  LOS: 4 days   Interim  History: Pt reports that he feels better and pain in ribs and back at a level of7/10. Last BM today. Pt reports that he does not take MS Contin on a daily basis as prescribed it causes him to feel like he has "brain for". He takes it when his pain has escalated to about 8-9/10. I have instructed patient that if he is requiring more than 4 Dilaudid pill daily, he should also take the MS Contin rather than waiting until the pain has escalated up to 8-9/10. Currently the  patient is doing very well with only 15 mg MS Contin BID. Last BM today.   Consultants:  Nephrology  Procedures:  None  Antibiotics:  None   Objective: Vitals:   02/15/16 0445 02/15/16 0456 02/15/16 0800 02/15/16 0927  BP:  116/62  115/79  Pulse:    65  Resp: 15 15 16 16   Temp:  98.4 F (36.9 C)  98.5 F (36.9 C)  TempSrc:  Oral  Oral  SpO2: 95% 92%  95%  Weight:  81.9 kg (180 lb 8.9 oz)    Height:       Weight change: -1.1 kg (-2 lb 6.8 oz)  Intake/Output Summary (Last 24 hours) at 02/15/16 1220 Last data filed at 02/15/16 1008  Gross per 24 hour  Intake          1761.51 ml  Output             4425 ml  Net         -2663.49 ml    General: Alert, awake, oriented x3, in mild distress due to pain.  HEENT: [place]/AT PEERL, EOMI, anicteric Heart: Regular rate and rhythm, without murmurs, rubs, gallops, PMI non-displaced, no heaves or thrills on palpation.  Lungs: Clear to auscultation, no wheezing or rhonchi noted. No increased vocal fremitus resonant to percussion  Abdomen: Soft, nontender, nondistended, positive bowel sounds, no masses no hepatosplenomegaly noted.  Neuro: No focal neurological deficits noted cranial nerves II through XII grossly intact.  Strength at functional baseline in bilateral upper and lower extremities. Musculoskeletal: No warmth swelling or erythema around joints, no spinal tenderness noted. Psychiatric: Patient alert and oriented  x3, good insight and cognition, good recent to remote recall.    Data Reviewed: Basic Metabolic Panel:  Recent Labs Lab 02/11/16 1352 02/11/16 2142 02/12/16 0907 02/13/16 0407 02/14/16 0530  NA 133* 134* 132* 139 138  K 5.7* 5.3* 5.4* 5.5* 4.5  CL 102 104 104 112* 116*  CO2 20* 21* 19* 21* 19*  GLUCOSE 146* 139* 130* 120* 98  BUN 55* 59* 64* 43* 19  CREATININE 7.18* 7.62* 6.20* 1.90* 0.90  CALCIUM 8.7* 8.6* 8.3* 8.2* 8.3*  PHOS  --   --  6.6* 4.6 3.2   Liver Function Tests:  Recent Labs Lab  02/11/16 1006 02/12/16 0907 02/13/16 0407 02/14/16 0530  AST 46* 43*  --   --   ALT 25 23  --   --   ALKPHOS 102 89  --   --   BILITOT 5.0* 3.2*  --   --   PROT 8.0 7.1  --   --   ALBUMIN 4.6 4.2 3.8 3.6   No results for input(s): LIPASE, AMYLASE in the last 168 hours. No results for input(s): AMMONIA in the last 168 hours. CBC:  Recent Labs Lab 02/11/16 1006 02/12/16 0501 02/13/16 0407 02/14/16 0530 02/15/16 0401  WBC 23.9* 22.2* 12.5* 11.4* 11.0*  NEUTROABS 18.4* 15.8* 7.9* 7.1 6.3  HGB 7.3* 6.7* 5.8* 6.4* 6.9*  HCT 21.8* 20.2* 17.5* 19.1* 19.9*  MCV 86.5 86.3 87.1 86.0 87.3  PLT 503* 469* 409* 398 421*   Cardiac Enzymes: No results for input(s): CKTOTAL, CKMB, CKMBINDEX, TROPONINI in the last 168 hours. BNP (last 3 results)  Recent Labs  08/02/15 2307 11/28/15 0520 02/11/16 1006  BNP 503.2* 422.5* 381.0*    ProBNP (last 3 results) No results for input(s): PROBNP in the last 8760 hours.  CBG: No results for input(s): GLUCAP in the last 168 hours.  Recent Results (from the past 240 hour(s))  Blood culture (routine x 2)     Status: None (Preliminary result)   Collection Time: 02/11/16 10:06 AM  Result Value Ref Range Status   Specimen Description BLOOD PORTA CATH  Final   Special Requests BOTTLES DRAWN AEROBIC AND ANAEROBIC 5ML  Final   Culture   Final    NO GROWTH 4 DAYS Performed at [facility]    Report Status PENDING  Incomplete  Blood culture (routine x 2)     Status: None (Preliminary result)   Collection Time: 02/11/16 11:30 AM  Result Value Ref Range Status   Specimen Description BLOOD RIGHT HAND  Final   Special Requests IN PEDIATRIC BOTTLE .5CC  Final   Culture   Final    NO GROWTH 4 DAYS Performed at [facility]    Report Status PENDING  Incomplete  MRSA PCR Screening     Status: None   Collection Time: 02/11/16  9:48 PM  Result Value Ref Range Status   MRSA by PCR NEGATIVE NEGATIVE Final    Comment:        The  GeneXpert MRSA Assay (FDA approved for NASAL specimens only), is one component of a comprehensive MRSA colonization surveillance program. It is not intended to diagnose MRSA infection nor to guide or monitor treatment for MRSA infections.      Studies: Dg Chest 2 View  Result Date: 01/31/2016 CLINICAL DATA:  Bilateral rib pain. History of hypertension, chronic pulmonary embolism and sickle cell anemia. EXAM: CHEST  2 VIEW COMPARISON:  Chest radiograph January 27, 2016 and CT chest January 05, 2016 FINDINGS: Cardiac silhouette is moderately enlarged and unchanged. Mediastinal silhouette is nonsuspicious. Pulmonary vascular congestion without pleural effusion or focal consolidation. RIGHT mid and lower lung zone atelectasis/scarring. No pneumothorax. Single lumen LEFT chest Port-A-Cath. Surgical clips in the included right abdomen compatible with cholecystectomy. Osseous structures are nonacute. Focal sclerosis in the humeral head compatible with infarcts. IMPRESSION: Stable cardiomegaly and pulmonary vascular congestion. RIGHT atelectasis/scarring. Electronically Signed   By: Elon Alas M.D.   On: 01/31/2016 04:46   US Renal  Result Date: 02/11/2016 CLINICAL DATA:  Renal failure.  Sickle cell disease EXAM: RENAL / URINARY TRACT ULTRASOUND COMPLETE COMPARISON:  CT abdomen and pelvis December 15, 2015 FINDINGS: Right Kidney: Length: 11.5 cm. Echogenicity and renal cortical thickness are within normal limits. No mass, perinephric fluid, or hydronephrosis visualized. No sonographically demonstrable calculus or ureterectasis. Left Kidney: Length: 11.7 cm. Echogenicity and renal cortical thickness are within normal limits. No mass, perinephric fluid, or hydronephrosis visualized. No sonographically demonstrable calculus or ureterectasis. Bladder: Appears normal for degree of bladder distention. IMPRESSION: Study within normal limits. Electronically Signed   By: Lowella Grip III M.D.   On:  02/11/2016 15:20   Dg Chest Portable 1 View  Result Date: 02/11/2016 CLINICAL DATA:  Sickle cell anemia.  Hypoxia. EXAM: PORTABLE CHEST 1 VIEW COMPARISON:  01/31/2016. FINDINGS: PowerPort catheter with lead tip projected over right atrium. Cardiomegaly with normal pulmonary vascularity. No focal infiltrate. No pleural effusion or pneumothorax. IMPRESSION: 1. PowerPort catheter noted with lead tip projected over right atrium. 2. Cardiomegaly with pulmonary vascular prominence and bilateral interstitial prominence consistent with mild congestive heart failure. Electronically Signed   By: [place]   On: 02/11/2016 10:22    Scheduled Meds: . ambrisentan  5 mg Oral QHS  . aspirin  81 mg Oral Daily  . folic acid  1 mg Oral Daily  . HYDROmorphone   Intravenous Q4H  . HYDROmorphone  4 mg Oral Q4H  . morphine  15 mg Oral Q12H  . rivaroxaban  20 mg Oral Daily  . sodium chloride flush  3 mL Intravenous Q12H   Continuous Infusions: . sodium chloride 10 mL/hr at 02/14/16 1001    Active Problems:   Acute renal failure (ARF) ([facility])     In excess of 35 minutes spent during this visit. Greater than 50% involved face to face contact with the patient for assessment, counseling and coordination of care.

## 2016-02-16 DIAGNOSIS — I2781 Cor pulmonale (chronic): Secondary | ICD-10-CM

## 2016-02-16 LAB — RETICULOCYTES
RBC.: 2.36 MIL/uL — ABNORMAL LOW (ref 4.22–5.81)
Retic Count, Absolute: 129.8 10*3/uL (ref 19.0–186.0)
Retic Ct Pct: 5.5 % — ABNORMAL HIGH (ref 0.4–3.1)

## 2016-02-16 LAB — BASIC METABOLIC PANEL
Anion gap: 7 (ref 5–15)
BUN: 8 mg/dL (ref 6–20)
CHLORIDE: 107 mmol/L (ref 101–111)
CO2: 23 mmol/L (ref 22–32)
Calcium: 9 mg/dL (ref 8.9–10.3)
Creatinine, Ser: 0.59 mg/dL — ABNORMAL LOW (ref 0.61–1.24)
GFR calc Af Amer: >60 mL/min (ref >60)
GFR calc non Af Amer: >60 mL/min (ref >60)
GLUCOSE: 123 mg/dL — AB (ref 65–99)
POTASSIUM: 3.9 mmol/L (ref 3.5–5.1)
Sodium: 137 mmol/L (ref 135–145)

## 2016-02-16 LAB — CBC WITH DIFFERENTIAL/PLATELET
Basophils Absolute: 0.2 10*3/uL — ABNORMAL HIGH (ref 0.0–0.1)
Basophils Relative: 2 %
EOS ABS: 0.5 10*3/uL (ref 0.0–0.7)
EOS PCT: 4 %
HCT: 20.6 % — ABNORMAL LOW (ref 39.0–52.0)
Hemoglobin: 7.2 g/dL — ABNORMAL LOW (ref 13.0–17.0)
LYMPHS ABS: 2.2 10*3/uL (ref 0.7–4.0)
LYMPHS PCT: 19 %
MCH: 30.5 pg (ref 26.0–34.0)
MCHC: 35 g/dL (ref 30.0–36.0)
MCV: 87.3 fL (ref 78.0–100.0)
MONO ABS: 2 10*3/uL — AB (ref 0.1–1.0)
Monocytes Relative: 18 %
Neutro Abs: 6.4 10*3/uL (ref 1.7–7.7)
Neutrophils Relative %: 57 %
PLATELETS: 422 10*3/uL — AB (ref 150–400)
RBC: 2.36 MIL/uL — ABNORMAL LOW (ref 4.22–5.81)
RDW: 19.2 % — AB (ref 11.5–15.5)
WBC: 11.3 10*3/uL — ABNORMAL HIGH (ref 4.0–10.5)

## 2016-02-16 LAB — CULTURE, BLOOD (ROUTINE X 2)
CULTURE: NO GROWTH
CULTURE: NO GROWTH

## 2016-02-16 MED ORDER — HYDROMORPHONE HCL 2 MG/ML IJ SOLN
2.0000 mg | INTRAMUSCULAR | Status: DC | PRN
Start: 2016-02-16 — End: 2016-02-16
  Administered 2016-02-16 (×2): 2 mg via INTRAVENOUS
  Filled 2016-02-16 (×2): qty 1

## 2016-02-16 MED ORDER — HYDROXYUREA 500 MG PO CAPS
1000.0000 mg | ORAL_CAPSULE | Freq: Every day | ORAL | Status: DC
  Administered 2016-02-16: 1000 mg via ORAL
  Filled 2016-02-16: qty 2

## 2016-02-16 MED ORDER — HEPARIN SOD (PORK) LOCK FLUSH 100 UNIT/ML IV SOLN
500.0000 [IU] | INTRAVENOUS | Status: AC | PRN
  Administered 2016-02-16: 500 [IU]

## 2016-02-16 MED ORDER — DEFERASIROX 360 MG PO TABS: 1080.0000 mg | ORAL_TABLET | Freq: Every day | ORAL | Status: DC

## 2016-02-16 NOTE — Discharge Summary (Signed)
[name] MRN: [id number] DOB/AGE: [birth year] 36 y.o.  Admit date: 02/11/2016 Discharge date: 02/16/2016  Primary Care Physician:  [name], MD   RECOMMENDATIONS: 1. Check CBC with diff and Reticulocytes in 3 days 2. Discontinue and stay away from ACE-I/ARBs.  3. Continue Jadenu. 4. Schedule appointment with Dr. Test-Pulmonology 5. Schedule appointment for Erythrophreresis   Discharge Diagnoses:   Patient Active Problem List   Diagnosis Date Noted  . Cor pulmonale, chronic ([place]) 08/25/2014    Priority: High  . Pulmonary HTN 06/18/2013    Priority: High  . Functional asplenia     Priority: High  . Anemia of chronic disease 06/25/2014    Priority: Medium  . Chronic respiratory failure with hypoxia ([place]) 03/14/2014    Priority: Medium  . Chronic anticoagulation 08/22/2013    Priority: Medium  . Avascular necrosis ([place])     Priority: Low  . Acute renal failure (ARF) ([place]) 02/11/2016  . Slurred speech 11/10/2015  . Ischemic stroke ([place])   . Sickle cell anemia with pain ([place]) 05/07/2015  . Anemia 01/31/2015  . Chronic anemia   . Hb-SS disease without crisis ([place]) 10/07/2014  . Acute on chronic respiratory failure with hypoxia ([place])   . PVC's (premature ventricular contractions) 09/10/2014  . Abnormal EKG 09/10/2014  . Sickle cell anemia ([place]) 06/25/2014  . PAH (pulmonary artery hypertension) 03/18/2014  . Paralytic strabismus, external ophthalmoplegia   . Chronic pain syndrome 12/12/2013  . Essential hypertension 08/22/2013  . Vitamin D deficiency 02/13/2013  . Hx of pulmonary embolus 06/29/2012  . Hemochromatosis 12/14/2011    DISCHARGE MEDICATION: Allergies as of 02/16/2016   No Known Allergies     Medication List    STOP taking these medications   lisinopril 5 MG tablet Commonly known as:  PRINIVIL,ZESTRIL     TAKE these medications   ambrisentan 5 MG tablet Commonly known as:  LETAIRIS Take 5 mg by mouth at bedtime.   aspirin 81 MG  chewable tablet Chew 1 tablet (81 mg total) by mouth daily. What changed:  Another medication with the same name was removed. Continue taking this medication, and follow the directions you see here.   cholecalciferol 1000 units tablet Commonly known as:  VITAMIN D Take 2,000 Units by mouth daily.   folic acid 1 MG tablet Commonly known as:  FOLVITE Take 1 mg by mouth daily.   gabapentin 300 MG capsule Commonly known as:  NEURONTIN Take 1 capsule (300 mg total) by mouth 3 (three) times daily.   HYDROmorphone 4 MG tablet Commonly known as:  DILAUDID Take 1 tablet (4 mg total) by mouth every 4 (four) hours as needed for moderate pain or severe pain.   hydroxyurea 500 MG capsule Commonly known as:  HYDREA Take 2 capsules (1,000 mg total) by mouth daily. May take with food to minimize GI side effects. Hold Hydrea until evaluated by Primary Provider or Hematologist.   JADENU 360 MG Tabs Generic drug:  Deferasirox Take 1,080 mg by mouth daily.   morphine 30 MG 12 hr tablet Commonly known as:  MS CONTIN Take 1 tablet (30 mg total) by mouth every 12 (twelve) hours.   potassium chloride SA 20 MEQ tablet Commonly known as:  K-DUR,KLOR-CON Take 2 tablets (40 mEq total) by mouth daily.   rivaroxaban 20 MG Tabs tablet Commonly known as:  XARELTO Take 1 tablet (20 mg total) by mouth at bedtime.   torsemide 20 MG tablet Commonly known as:  DEMADEX Take 20 mg  by mouth daily as needed (for weight gain greater than 3lbs.).   zolpidem 10 MG tablet Commonly known as:  AMBIEN Take 1 tablet (10 mg total) by mouth at bedtime as needed for sleep.         Consults:    SIGNIFICANT DIAGNOSTIC STUDIES:  Dg Chest 2 View  Result Date: 01/31/2016 CLINICAL DATA:  Bilateral rib pain. History of hypertension, chronic pulmonary embolism and sickle cell anemia. EXAM: CHEST  2 VIEW COMPARISON:  Chest radiograph January 27, 2016 and CT chest January 05, 2016 FINDINGS: Cardiac silhouette is  moderately enlarged and unchanged. Mediastinal silhouette is nonsuspicious. Pulmonary vascular congestion without pleural effusion or focal consolidation. RIGHT mid and lower lung zone atelectasis/scarring. No pneumothorax. Single lumen LEFT chest Port-A-Cath. Surgical clips in the included right abdomen compatible with cholecystectomy. Osseous structures are nonacute. Focal sclerosis in the humeral head compatible with infarcts. IMPRESSION: Stable cardiomegaly and pulmonary vascular congestion. RIGHT atelectasis/scarring. Electronically Signed   By: Elon Alas M.D.   On: 01/31/2016 04:46   US Renal  Result Date: 02/11/2016 CLINICAL DATA:  Renal failure.  Sickle cell disease EXAM: RENAL / URINARY TRACT ULTRASOUND COMPLETE COMPARISON:  CT abdomen and pelvis December 15, 2015 FINDINGS: Right Kidney: Length: 11.5 cm. Echogenicity and renal cortical thickness are within normal limits. No mass, perinephric fluid, or hydronephrosis visualized. No sonographically demonstrable calculus or ureterectasis. Left Kidney: Length: 11.7 cm. Echogenicity and renal cortical thickness are within normal limits. No mass, perinephric fluid, or hydronephrosis visualized. No sonographically demonstrable calculus or ureterectasis. Bladder: Appears normal for degree of bladder distention. IMPRESSION: Study within normal limits. Electronically Signed   By: Lowella Grip III M.D.   On: 02/11/2016 15:20   Dg Chest Portable 1 View  Result Date: 02/11/2016 CLINICAL DATA:  Sickle cell anemia.  Hypoxia. EXAM: PORTABLE CHEST 1 VIEW COMPARISON:  01/31/2016. FINDINGS: PowerPort catheter with lead tip projected over right atrium. Cardiomegaly with normal pulmonary vascularity. No focal infiltrate. No pleural effusion or pneumothorax. IMPRESSION: 1. PowerPort catheter noted with lead tip projected over right atrium. 2. Cardiomegaly with pulmonary vascular prominence and bilateral interstitial prominence consistent with mild congestive  heart failure. Electronically Signed   By: Marcello Moores  Register   On: 02/11/2016 10:22       Recent Results (from the past 240 hour(s))  Blood culture (routine x 2)     Status: None (Preliminary result)   Collection Time: 02/11/16 10:06 AM  Result Value Ref Range Status   Specimen Description BLOOD PORTA CATH  Final   Special Requests BOTTLES DRAWN AEROBIC AND ANAEROBIC 5ML  Final   Culture   Final    NO GROWTH 4 DAYS Performed at [facility]    Report Status PENDING  Incomplete  Blood culture (routine x 2)     Status: None (Preliminary result)   Collection Time: 02/11/16 11:30 AM  Result Value Ref Range Status   Specimen Description BLOOD RIGHT HAND  Final   Special Requests IN PEDIATRIC BOTTLE .5CC  Final   Culture   Final    NO GROWTH 4 DAYS Performed at [facility]    Report Status PENDING  Incomplete  MRSA PCR Screening     Status: None   Collection Time: 02/11/16  9:48 PM  Result Value Ref Range Status   MRSA by PCR NEGATIVE NEGATIVE Final    Comment:        The GeneXpert MRSA Assay (FDA approved for NASAL specimens only), is one component of  a comprehensive MRSA colonization surveillance program. It is not intended to diagnose MRSA infection nor to guide or monitor treatment for MRSA infections.     BRIEF ADMITTING H & P: Opiate tolerant patient with Hb SS who was discharged 4 days ago returns to the ED today with pain in ribs and back. During the course of work up was found to have an significantly elevated BUN and Cr. Patient denies any NSAID use, vomiting or diarrhea. He states that he has had decreased urine output for the last 2 days and the urine has been particularly dark in color. He denies any fevers, rashes. Vomiting or diarrhea. With regard to his pain, he states that he awoke this morning with significant pain in the ribs and back, throbbing in nature and characteristic of his sickle cell crisis. Pt is currently awake and alert and able to  answer questions without difficulty. His speech is more garbled than usual. However he has no facial asymmetry or drooling.   Hospital Course:  Present on Admission: . Acute renal failure (ARF) (HCC)  Pt was admitted with Acute kidney Injury (AKI) with a  BUN of 50 and Cr of 7.2 increased from 0.8 just 4 days before. The patient was clinically volume depleted on admission and was given aggressive hydration in addition all nephrotoxic medications were withheld. Nephrology was consulted and agreed with therapeutic plan. Additionally they recommended discontinuing Lisinopril and staying away form any ACE-I/ARB given his pulmonary hypertension. His AKI resolved with hydration and at the time of discharge his BUN was 8 and  Cr was 0.59. He is advised to stay away from NSAID's and to stop taking Lisinopril.   Anemia of Chronic Disease: Pt was transfused 1 unit of blood during hospitalization. At the time of discharge his Hb was 7.2 g/dL. His hydrea was resumed and he should have his Hb checked within 3 days.  Chronic Anticoagulation: He was treated with IV Heparin due to ARI. Once renal function recovered, he was resumed on Xarelto.   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 At the time of discharge he was 96% on RA and 97% with ambulation in RA. Of note his weight at time of discahrge was 81.3 kg (179 lb 7 oz) which is would consider to be his dry weight.  Pt instructed to wear Oxygen at night. He is followed by Dr. Ermalene Searing Test-Pulmonolgist at [facility]. However he missed his last appointment on 12/20/2015 and has not rescheduled.   Chronic Pain: Pt was managed with MS Contin 15 mg BID. Consider decreasing MS Contin to 15 mg BID as lowest effective dose of Opiate.  Secondary Hemochromatosis: Pt to continue on Jadenu.     Disposition and Follow-up:  Pt discharged in good  condition and is to follow up with PMD's office for labs in 3 days and for appointment in 1 week.    DISCHARGE EXAM:  General: Alert, awake, oriented x3, in no apparent distress.  HEENT: [place]/AT PEERL, EOMI, anicteric Neck: Trachea midline, no masses, no thyromegal,y no JVD, no carotid bruit OROPHARYNX: Moist, No exudate/ erythema/lesions.  Heart: Regular rate and rhythm, without murmurs, rubs, gallops or S3. PMI non-displaced. Exam reveals no decreased pulses. Pulmonary/Chest: Normal effort. Breath sounds normal. No. Apnea. Clear to auscultation,no stridor,  no wheezing and no rhonchi noted. No respiratory distress and no tenderness noted. Abdomen: Soft, nontender, nondistended, normal bowel sounds, no masses no hepatosplenomegaly  noted. No fluid wave and no ascites. There is no guarding or rebound. Neuro: Alert and oriented to person, place and time. Normal motor skills, Displays no atrophy or tremors and exhibits normal muscle tone.  No focal neurological deficits noted cranial nerves II through XII grossly intact. No sensory deficit noted.  Strength at baseline in bilateral upper and lower extremities. Gait normal. Musculoskeletal: No warmth swelling or erythema around joints, no spinal tenderness noted. Psychiatric: Patient alert and oriented x3, good insight and cognition, good recent to remote recall.Mood, affect and judgement normal Skin: Skin is warm and dry. No bruising, no ecchymosis and no rash noted. Pt is not diaphoretic. No erythema. No pallor    Blood pressure 111/79, pulse 76, temperature 98.6 F (37 C), temperature source Oral, resp. rate 16, height 6' (1.829 m), weight 81.3 kg (179 lb 3.7 oz), SpO2 96 %.   Recent Labs  02/14/16 0530 02/15/16 1750  NA 138 136  K 4.5 4.2  CL 116* 107  CO2 19* 24  GLUCOSE 98 105*  BUN 19 8  CREATININE 0.90 0.75  CALCIUM 8.3* 8.8*  PHOS 3.2 4.0    Recent Labs  02/14/16 0530 02/15/16 1750  ALBUMIN 3.6 3.8   No results for  input(s): LIPASE, AMYLASE in the last 72 hours.  Recent Labs  02/15/16 0401 02/16/16 0504  WBC 11.0* 11.3*  NEUTROABS 6.3 6.4  HGB 6.9* 7.2*  HCT 19.9* 20.6*  MCV 87.3 87.3  PLT 421* 422*     Total time spent including face to face and decision making was greater than 30 minutes  Signed: Danyale Ridinger A. 02/16/2016, 10:58 AM

## 2016-02-16 NOTE — Progress Notes (Signed)
SATURATION QUALIFICATIONS: (This note is used to comply with regulatory documentation for home oxygen)  Patient Saturations on Room Air at Rest 91-95% Patient Saturations on Room Air while Ambulating 95-100% on RA Patient Saturations on [place] of oxygen while Ambulating not required  Please briefly explain why patient needs home oxygen:  Pt ambulated 360 ft entire whole circle without Dyspnea--on RA sats ranging 95-100%  Returned to room, 96%.   SRP, RN

## 2016-02-16 NOTE — Progress Notes (Signed)
Pt off oxygen, follow on sats on RA pt sats 95-96 % on RA. Will notifiy MD. SRP, RN

## 2016-02-16 NOTE — Consult Note (Signed)
   [facility] CM Inpatient Consult   02/16/2016  RYSON BLACKHAM [birth year] [id number]    Came to visit Mr. [place] to discuss and offer [facility] services. He has declined on numerous occassions in the past. He continues to decline [facility] Care Management program services. He denies having any needs for medications, transportation, or with community resources. [facility] closely with [facility]. He already has contact information to call if [place] Management program services are needed. Will make inpatient RNCM aware.   Marthenia Rolling, MSN-Ed, RN,BSN [facility] Liaison [phone]

## 2016-02-16 NOTE — Progress Notes (Signed)
Patient complaining of constant 7/10 throbbing chest pain. No SOB. States it has been going on since the afternoon. Vitals WNL. EKG done. On call made aware. No new orders given.

## 2016-02-17 IMAGING — CT CT ANGIO CHEST
2 of 6 series · 18 of 36 positions shown · IV contrast (omnipaque)
Comparison: DG CHEST 2 VIEW dated 03/22/2013; CT CHEST W/CM dated
11/17/2012; CT ANGIO CHEST W/CM &/OR WO/CM dated 11/13/2012

CLINICAL DATA: Hypoxemia.  Known history of pulmonary embolism.

EXAM:
CT ANGIOGRAPHY CHEST WITH CONTRAST
TECHNIQUE: Multidetector CT imaging of the chest was performed using the
standard protocol during bolus administration of intravenous
contrast. Multiplanar CT image reconstructions and MIPs were
obtained to evaluate the vascular anatomy.
CONTRAST:  100mL OMNIPAQUE IOHEXOL 350 MG/ML SOLN

[Series 7: pe thins @ 1mm · axial · 0.74mm/px · z∈[-239,+21]mm · 17 of 290 slices shown]
[im 15/290  lung]
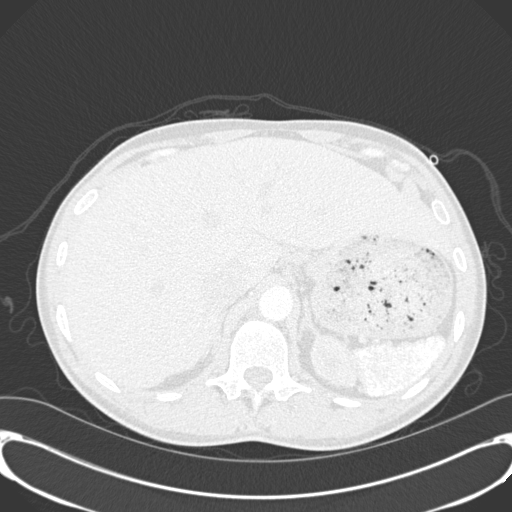
[im 29/290  mediastinal]
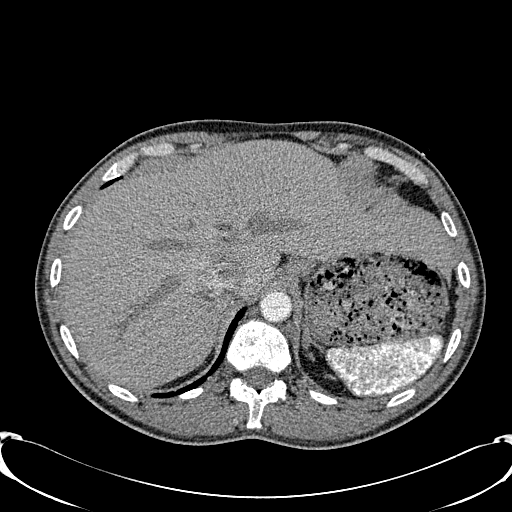
[im 44/290  lung]
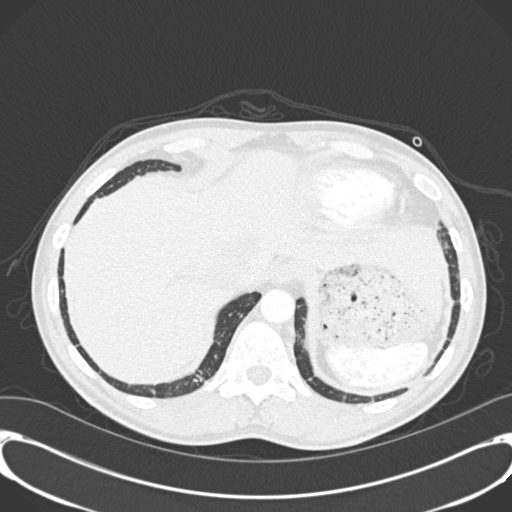
[im 58/290  mediastinal]
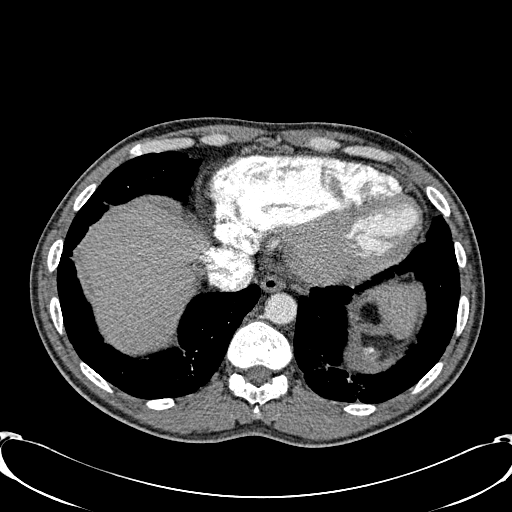
[im 87/290  lung]
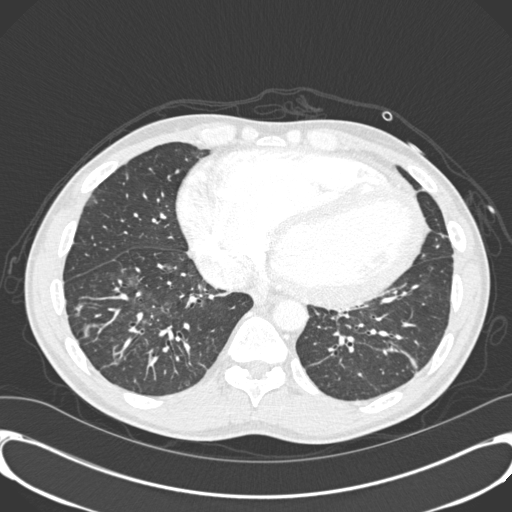
[im 102/290  mediastinal]
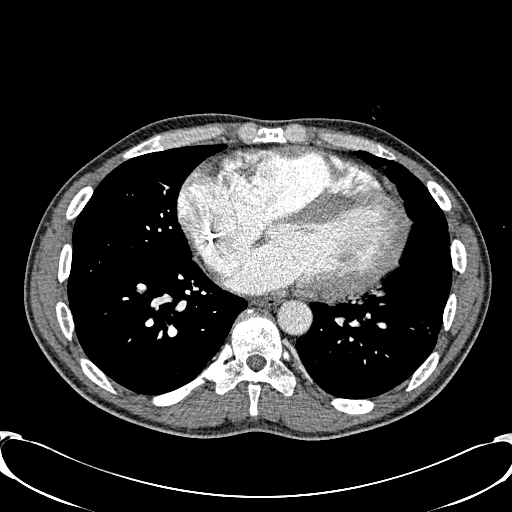
[im 116/290  lung]
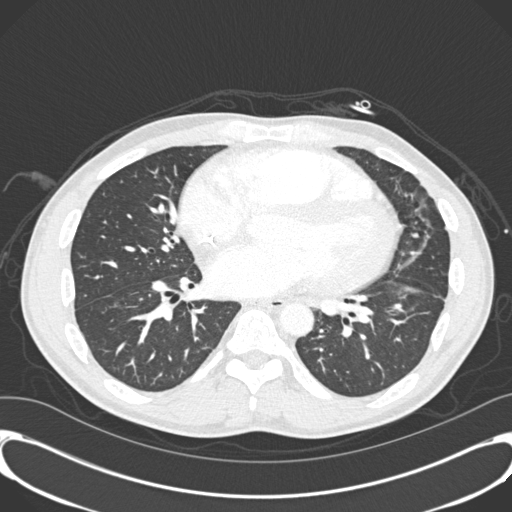
[im 131/290  mediastinal]
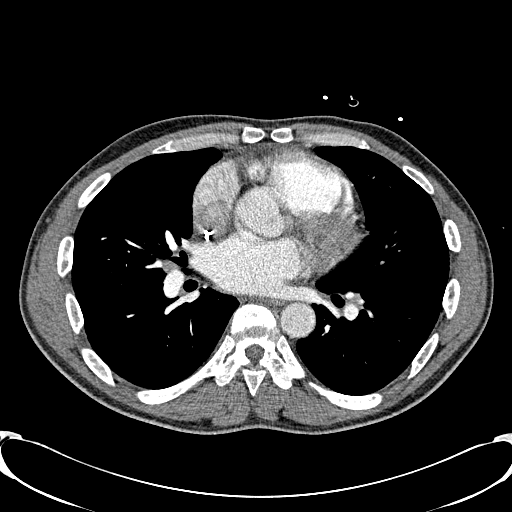
[im 145/290  lung]
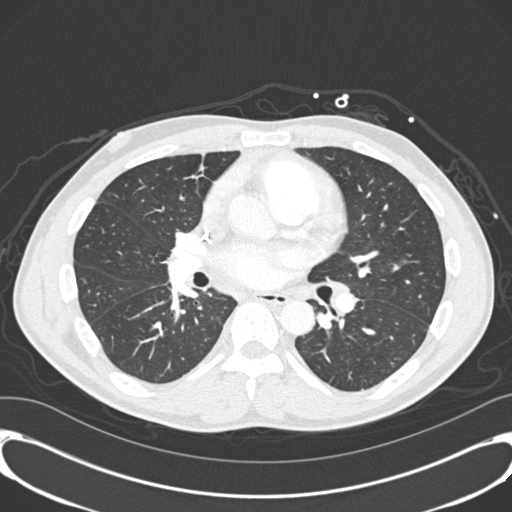
[im 159/290  mediastinal]
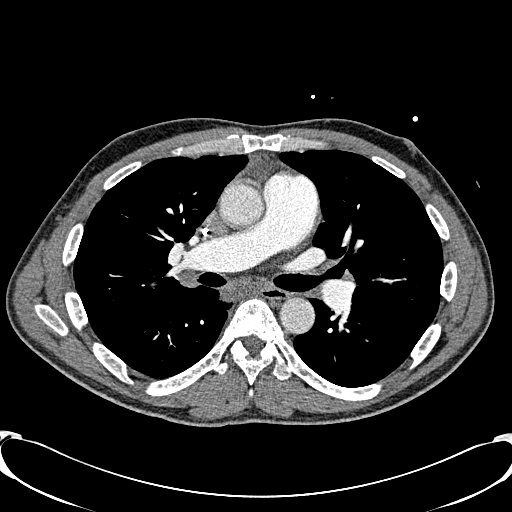
[im 174/290  lung]
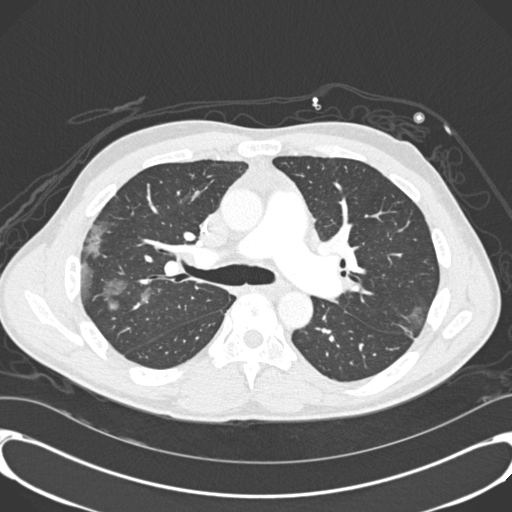
[im 188/290  mediastinal]
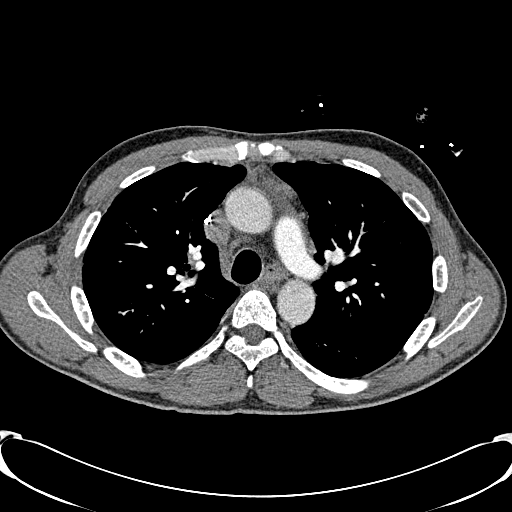
[im 203/290  lung]
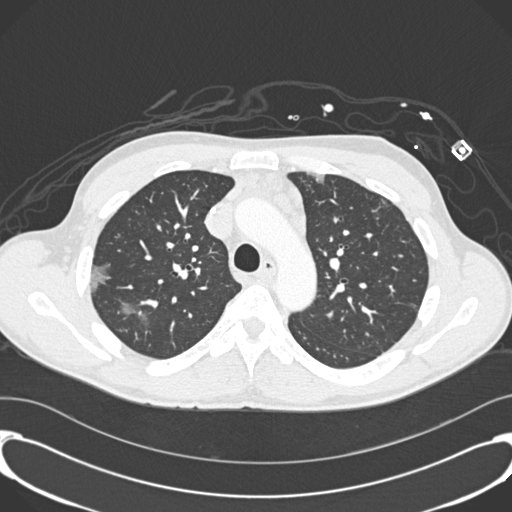
[im 232/290  mediastinal]
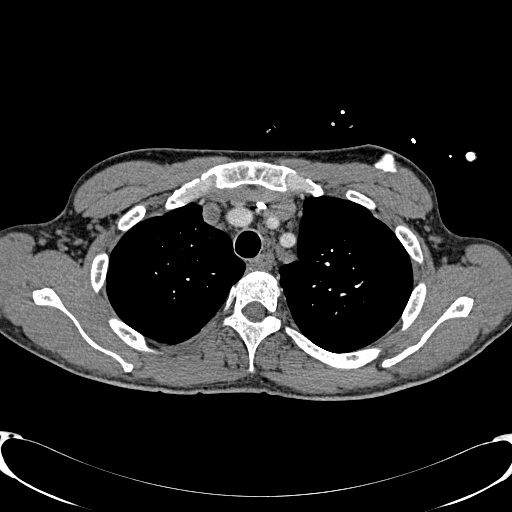
[im 246/290  lung]
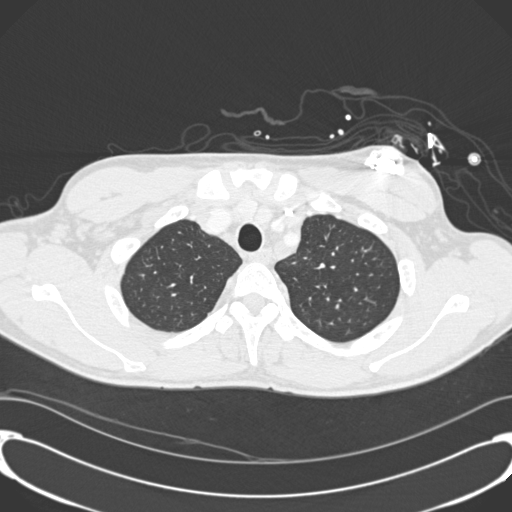
[im 261/290  mediastinal]
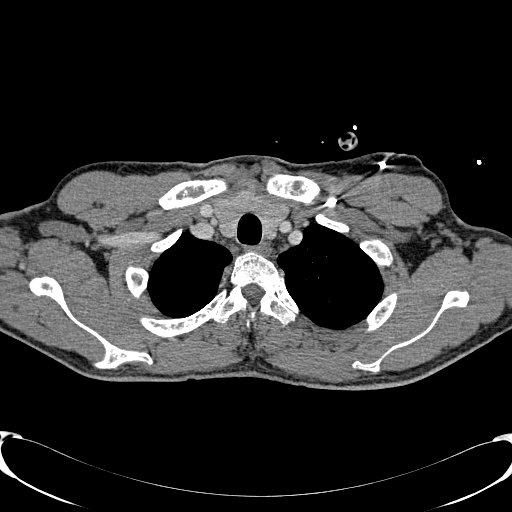
[im 275/290  lung]
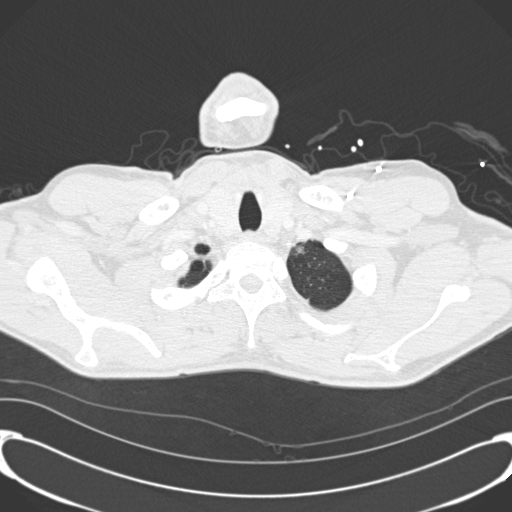

[Series 603: cor · coronal · 0.74mm/px · 1 of 77 slices shown]
[im 39/77  mediastinal]
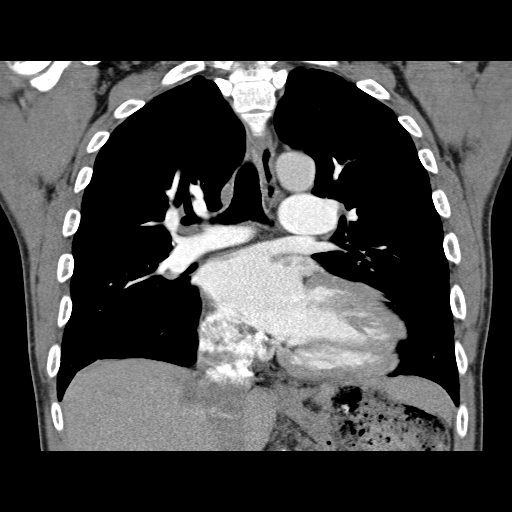

[18 of 36 positions shown; findings below may reference images not displayed]

FINDINGS: Technically adequate study without pulmonary embolus. Aorta and
branch vessels are within normal limits. There is no axillary
adenopathy. Left subclavian Port-A-Cath is unchanged with the tip in
the right atrium. Cardiomegaly is present with right heart
enlargement. There is also enlargement of the left and right
pulmonary arteries suggesting pulmonary arterial hypertension.
Bilateral gynecomastia is incidentally noted.

There are scattered foci of peripheral ground-glass attenuation in
both lungs, a finding which can be associated with acute chest
syndrome and small vessel infarction with hemorrhagic edema.
Bronchopneumonia and small vessel disease are in the differential
considerations. Incidental imaging of the upper abdomen is grossly
within normal limits. Mediastinal adenopathy appears similar to
prior exam. Unchanged right hilar adenopathy. Notably, no pleural
effusions. Sickle cell endplate infarcts are present in the thoracic
spine with multiple Schmorl's nodes.

Review of the MIP images confirms the above findings.
IMPRESSION: 1. Negative for pulmonary embolus.
2. Scattered areas of ground-glass attenuation bilaterally in the
lungs suggesting acute chest syndrome in this patient with sickle
cell anemia.
3. Unchanged cardiomegaly it and mediastinal and hilar adenopathy.
4. Enlargement of the left and right pulmonary arteries and right
heart enlargement suggesting pulmonary arterial hypertension.

## 2016-02-19 ENCOUNTER — Emergency Department (HOSPITAL_COMMUNITY)
Admission: EM | Admit: 2016-02-19 | Discharge: 2016-02-19 | Disposition: A | Discharge status: Home / Self Care | Payer: Medicare Other | Attending: Emergency Medicine | Admitting: Emergency Medicine

## 2016-02-19 ENCOUNTER — Encounter (HOSPITAL_COMMUNITY): Payer: Self-pay | Admitting: Emergency Medicine

## 2016-02-19 ENCOUNTER — Emergency Department (HOSPITAL_COMMUNITY): Payer: Medicare Other

## 2016-02-19 ENCOUNTER — Non-Acute Institutional Stay (EMERGENCY_DEPARTMENT_HOSPITAL)
Admission: AD | Admit: 2016-02-19 | Discharge: 2016-02-19 | Disposition: A | Discharge status: Home / Self Care | Payer: Medicare Other | Source: Ambulatory Visit | Attending: Internal Medicine | Admitting: Internal Medicine

## 2016-02-19 ENCOUNTER — Encounter (HOSPITAL_COMMUNITY): Payer: Self-pay | Admitting: *Deleted

## 2016-02-19 DIAGNOSIS — I10 Essential (primary) hypertension: Secondary | ICD-10-CM | POA: Insufficient documentation

## 2016-02-19 DIAGNOSIS — Z8673 Personal history of transient ischemic attack (TIA), and cerebral infarction without residual deficits: Secondary | ICD-10-CM | POA: Insufficient documentation

## 2016-02-19 DIAGNOSIS — Z7982 Long term (current) use of aspirin: Secondary | ICD-10-CM | POA: Diagnosis not present

## 2016-02-19 DIAGNOSIS — R079 Chest pain, unspecified: Secondary | ICD-10-CM | POA: Diagnosis not present

## 2016-02-19 DIAGNOSIS — Z87891 Personal history of nicotine dependence: Secondary | ICD-10-CM | POA: Diagnosis not present

## 2016-02-19 DIAGNOSIS — D5700 Hb-SS disease with crisis, unspecified: Secondary | ICD-10-CM | POA: Diagnosis not present

## 2016-02-19 DIAGNOSIS — Z96641 Presence of right artificial hip joint: Secondary | ICD-10-CM | POA: Diagnosis not present

## 2016-02-19 LAB — COMPREHENSIVE METABOLIC PANEL
ALK PHOS: 70 U/L (ref 38–126)
ALT: 19 U/L (ref 17–63)
AST: 25 U/L (ref 15–41)
Albumin: 4.3 g/dL (ref 3.5–5.0)
Anion gap: 8 (ref 5–15)
BILIRUBIN TOTAL: 3.1 mg/dL — AB (ref 0.3–1.2)
BUN: 8 mg/dL (ref 6–20)
CALCIUM: 9.1 mg/dL (ref 8.9–10.3)
CHLORIDE: 106 mmol/L (ref 101–111)
CO2: 23 mmol/L (ref 22–32)
CREATININE: 0.77 mg/dL (ref 0.61–1.24)
Glucose, Bld: 104 mg/dL — ABNORMAL HIGH (ref 65–99)
Potassium: 3.2 mmol/L — ABNORMAL LOW (ref 3.5–5.1)
Sodium: 137 mmol/L (ref 135–145)
TOTAL PROTEIN: 7.2 g/dL (ref 6.5–8.1)

## 2016-02-19 LAB — CBC WITH DIFFERENTIAL/PLATELET
BASOS ABS: 0.1 10*3/uL (ref 0.0–0.1)
Basophils Relative: 0 %
EOS PCT: 2 %
Eosinophils Absolute: 0.3 10*3/uL (ref 0.0–0.7)
HCT: 26.3 % — ABNORMAL LOW (ref 39.0–52.0)
Hemoglobin: 8.8 g/dL — ABNORMAL LOW (ref 13.0–17.0)
Lymphocytes Relative: 8 %
Lymphs Abs: 1.2 10*3/uL (ref 0.7–4.0)
MCH: 29 pg (ref 26.0–34.0)
MCHC: 33.5 g/dL (ref 30.0–36.0)
MCV: 86.8 fL (ref 78.0–100.0)
MONO ABS: 1.8 10*3/uL — AB (ref 0.1–1.0)
Monocytes Relative: 11 %
Neutro Abs: 12.9 10*3/uL — ABNORMAL HIGH (ref 1.7–7.7)
Neutrophils Relative %: 79 %
PLATELETS: 541 10*3/uL — AB (ref 150–400)
RBC: 3.03 MIL/uL — ABNORMAL LOW (ref 4.22–5.81)
RDW: 19.2 % — AB (ref 11.5–15.5)
WBC: 16.2 10*3/uL — ABNORMAL HIGH (ref 4.0–10.5)

## 2016-02-19 LAB — RETICULOCYTES
RBC.: 3.03 MIL/uL — ABNORMAL LOW (ref 4.22–5.81)
RETIC COUNT ABSOLUTE: 221.2 10*3/uL — AB (ref 19.0–186.0)
Retic Ct Pct: 7.3 % — ABNORMAL HIGH (ref 0.4–3.1)

## 2016-02-19 IMAGING — CR DG CHEST 1V PORT
1 series · 1 of 1 positions shown · non-contrast
Comparison: CT ANGIO CHEST W/CM &/OR WO/CM dated 03/26/2013; DG
CHEST 2 VIEW dated 03/22/2013

CLINICAL DATA: Hypoxia

EXAM:
PORTABLE CHEST - 1 VIEW

[AP]
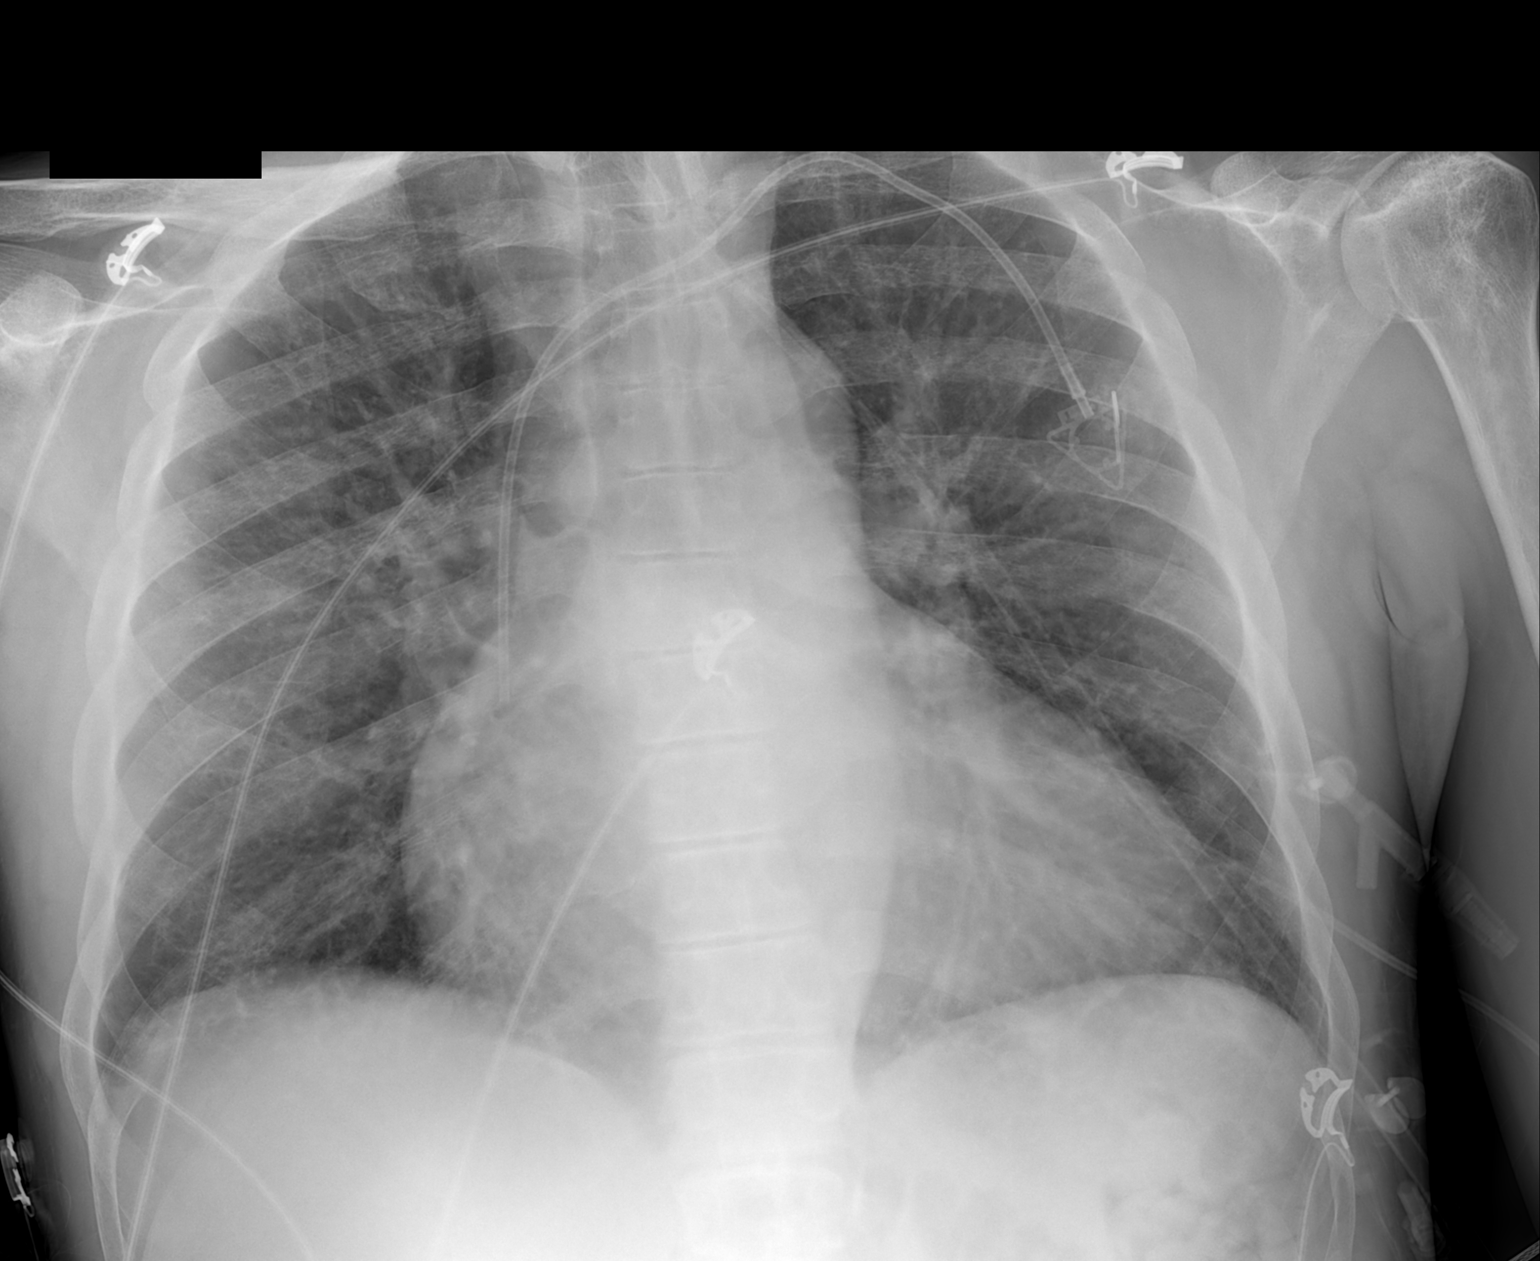

[1 of 1 positions shown; findings below may reference images not displayed]

FINDINGS: Stable left subclavian Port-A-Cath. Cardiomegaly. Clear lungs. No
pneumothorax.
IMPRESSION: No active disease.

## 2016-02-19 MED ORDER — HYDROMORPHONE 1 MG/ML IV SOLN
INTRAVENOUS | Status: DC
  Administered 2016-02-19: 12.6 mg via INTRAVENOUS
  Administered 2016-02-19: 12:00:00 via INTRAVENOUS
  Filled 2016-02-19: qty 25

## 2016-02-19 MED ORDER — NALOXONE HCL 0.4 MG/ML IJ SOLN: 0.4000 mg | INTRAMUSCULAR | Status: DC | PRN

## 2016-02-19 MED ORDER — HYDROMORPHONE HCL 2 MG/ML IJ SOLN
2.0000 mg | INTRAMUSCULAR | Status: DC
  Administered 2016-02-19 (×2): 2 mg via INTRAVENOUS
  Filled 2016-02-19: qty 1

## 2016-02-19 MED ORDER — DIPHENHYDRAMINE HCL 50 MG/ML IJ SOLN
25.0000 mg | Freq: Once | INTRAMUSCULAR | Status: AC
  Administered 2016-02-19: 25 mg via INTRAVENOUS
  Filled 2016-02-19: qty 1

## 2016-02-19 MED ORDER — HYDROMORPHONE HCL 2 MG/ML IJ SOLN
2.0000 mg | INTRAMUSCULAR | Status: AC
  Filled 2016-02-19: qty 1

## 2016-02-19 MED ORDER — HYDROMORPHONE HCL 2 MG/ML IJ SOLN: 2.0000 mg | INTRAMUSCULAR | Status: AC

## 2016-02-19 MED ORDER — SODIUM CHLORIDE 0.9% FLUSH
10.0000 mL | INTRAVENOUS | Status: AC | PRN
  Administered 2016-02-19: 10 mL

## 2016-02-19 MED ORDER — ONDANSETRON HCL 4 MG/2ML IJ SOLN: 4.0000 mg | INTRAMUSCULAR | Status: DC | PRN

## 2016-02-19 MED ORDER — SODIUM CHLORIDE 0.9 % IV SOLN
25.0000 mg | INTRAVENOUS | Status: DC | PRN
  Filled 2016-02-19: qty 0.5

## 2016-02-19 MED ORDER — SODIUM CHLORIDE 0.9% FLUSH: 9.0000 mL | INTRAVENOUS | Status: DC | PRN

## 2016-02-19 MED ORDER — HYDROMORPHONE HCL 2 MG/ML IJ SOLN
2.0000 mg | INTRAMUSCULAR | Status: AC
  Administered 2016-02-19: 2 mg via INTRAVENOUS
  Filled 2016-02-19: qty 1

## 2016-02-19 MED ORDER — HEPARIN SOD (PORK) LOCK FLUSH 100 UNIT/ML IV SOLN
500.0000 [IU] | INTRAVENOUS | Status: DC | PRN
  Filled 2016-02-19: qty 5

## 2016-02-19 MED ORDER — HYDROMORPHONE HCL 2 MG/ML IJ SOLN: 2.0000 mg | INTRAMUSCULAR | Status: DC

## 2016-02-19 MED ORDER — DEXTROSE-NACL 5-0.45 % IV SOLN
INTRAVENOUS | Status: DC
  Administered 2016-02-19: 12:00:00 via INTRAVENOUS

## 2016-02-19 MED ORDER — HEPARIN SOD (PORK) LOCK FLUSH 100 UNIT/ML IV SOLN
500.0000 [IU] | INTRAVENOUS | Status: AC | PRN
  Administered 2016-02-19: 500 [IU]

## 2016-02-19 MED ORDER — DIPHENHYDRAMINE HCL 25 MG PO CAPS: 25.0000 mg | ORAL_CAPSULE | ORAL | Status: DC | PRN

## 2016-02-19 MED ORDER — SODIUM CHLORIDE 0.9% FLUSH: 10.0000 mL | INTRAVENOUS | Status: DC | PRN

## 2016-02-19 MED ORDER — ONDANSETRON HCL 4 MG/2ML IJ SOLN: 4.0000 mg | Freq: Four times a day (QID) | INTRAMUSCULAR | Status: DC | PRN

## 2016-02-19 MED ORDER — DEXTROSE-NACL 5-0.45 % IV SOLN
INTRAVENOUS | Status: DC
  Administered 2016-02-19: 08:00:00 via INTRAVENOUS

## 2016-02-19 NOTE — Progress Notes (Signed)
Pt admitted to sickle cell clinic at this time. VSS, pain 7/10.  Nurse practitioner notified of pt's arrival.  Coolidge Breeze, RN 02/19/2016

## 2016-02-19 NOTE — ED Provider Notes (Signed)
[facility] DEPT Provider Note   CSN: [id number] Arrival date & time: 02/19/16  0425     History   Chief Complaint Chief Complaint  Patient presents with  . Sickle Cell Pain Crisis    HPI [name] is a 36 y.o. 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 Patient reports bilateral flank pain is localized to his lower ribs and feels exactly the same as prior sickle cell pain that is pressure, achy, nonradiating, 8/10. Patient also reports bilateral ankle pain and low back pain which he states sometimes he also gets with sickle cell pain, also pressure, achy, nonradiating and 8/10 pain. He states that he is tried pain medication at home last night with no relief. He states the cold weather aggravates his symptoms. Nothing else makes his pain better or worse. He states he uses oxygen as needed at home. Patient reports feeling better after being discharged on Tuesday. Patient denies trauma, abdominal pain, nausea, vomiting, chest pain, shortness of breath, fevers, chills, urinary symptoms, changes in gait.   The history is provided by the patient. No language interpreter was used.    Past Medical History:  Diagnosis Date  . Acute chest syndrome ([place]) 06/18/2013  . Acute embolism and thrombosis of right internal jugular vein ([place])   . Alcohol consumption of one to four drinks per day   . Avascular necrosis (HCC)    Right Hip  . Blood transfusion   . Chronic anticoagulation   . Demand ischemia ([place]) 01/02/2014  . Former smoker   . Functional asplenia   . Hb-SS disease with crisis ([place])   . History of Clostridium difficile infection   . History of pulmonary embolus (PE)   . Hypertension   . Hypokalemia   . Leukocytosis    Chronic  . Mood disorder ([place])   . Noncompliance with  medication regimen   . Oxygen deficiency   . Pulmonary hypertension   . Second hand tobacco smoke exposure   . Sickle cell anemia (HCC)   . Sickle-cell crisis with associated acute chest syndrome ([place]) 05/13/2013  . Stroke ([place])   . Thrombocytosis (HCC)    Chronic  . Uses marijuana     Patient Active Problem List   Diagnosis Date Noted  . Acute renal failure (ARF) ([place]) 02/11/2016  . Slurred speech 11/10/2015  . Ischemic stroke ([place])   . Sickle cell anemia with pain ([place]) 05/07/2015  . Anemia 01/31/2015  . Chronic anemia   . Hb-SS disease without crisis ([place]) 10/07/2014  . Acute on chronic respiratory failure with hypoxia ([place])   . PVC's (premature ventricular contractions) 09/10/2014  . Abnormal EKG 09/10/2014  . Cor pulmonale, chronic ([place]) 08/25/2014  . Sickle cell anemia ([place]) 06/25/2014  . Anemia of chronic disease 06/25/2014  . PAH (pulmonary artery hypertension) 03/18/2014  . Chronic respiratory failure with hypoxia ([place]) 03/14/2014  . Paralytic strabismus, external ophthalmoplegia   . Chronic pain syndrome 12/12/2013  . Chronic anticoagulation 08/22/2013  . Essential hypertension 08/22/2013  . Pulmonary HTN 06/18/2013  . Functional asplenia   . Vitamin D deficiency 02/13/2013  . Hx of pulmonary embolus 06/29/2012  . Hemochromatosis 12/14/2011  . Avascular necrosis [facility])     Past Surgical History:  Procedure Laterality Date  . CHOLECYSTECTOMY     01/2008  .  Excision of left periauricular cyst     10/2009  . Excision of right ear lobe cyst with primary closur     11/2007  . Porta cath placement    . Porta cath removal    . PORTACATH PLACEMENT  01/05/2012   Procedure: INSERTION PORT-A-CATH;  Surgeon: Odis Hollingshead, MD;  Location: [facility];  Service: General;  Laterality: N/A;  ultrasound guiced port a cath insertion with fluoroscopy  . Right hip replacement     08/2006  . UMBILICAL HERNIA REPAIR     01/2008       Home Medications    Prior to Admission  medications   Medication Sig Start Date End Date Taking? Authorizing Provider  ambrisentan (LETAIRIS) 5 MG tablet Take 5 mg by mouth at bedtime.   Yes Historical Provider, MD  cholecalciferol (VITAMIN D) 1000 units tablet Take 2,000 Units by mouth daily.   Yes Historical Provider, MD  Deferasirox (JADENU) 360 MG TABS Take 1,080 mg by mouth daily.    Yes Historical Provider, MD  folic acid (FOLVITE) 1 MG tablet Take 1 mg by mouth daily.   Yes Historical Provider, MD  gabapentin (NEURONTIN) 300 MG capsule Take 1 capsule (300 mg total) by mouth 3 (three) times daily. 06/01/15  Yes Tresa Garter, MD  HYDROmorphone (DILAUDID) 4 MG tablet Take 1 tablet (4 mg total) by mouth every 4 (four) hours as needed for moderate pain or severe pain. 02/04/16 02/19/16 Yes Olugbemiga Essie Christine, MD  hydroxyurea (HYDREA) 500 MG capsule Take 2 capsules (1,000 mg total) by mouth daily. May take with food to minimize GI side effects. Hold Hydrea until evaluated by Primary Provider or Hematologist. 12/22/15  Yes [name], MD  morphine (MS CONTIN) 30 MG 12 hr tablet Take 1 tablet (30 mg total) by mouth every 12 (twelve) hours. 02/09/16 03/10/16 Yes Tresa Garter, MD  potassium chloride SA (K-DUR,KLOR-CON) 20 MEQ tablet Take 2 tablets (40 mEq total) by mouth daily. 12/23/15  Yes [name], MD  rivaroxaban (XARELTO) 20 MG TABS tablet Take 1 tablet (20 mg total) by mouth at bedtime. 12/29/15  Yes Tresa Garter, MD  torsemide (DEMADEX) 20 MG tablet Take 20 mg by mouth daily as needed (for weight gain greater than 3lbs.).    Yes Historical Provider, MD  zolpidem (AMBIEN) 10 MG tablet Take 1 tablet (10 mg total) by mouth at bedtime as needed for sleep. 02/09/16 03/10/16 Yes Tresa Garter, MD  aspirin 81 MG chewable tablet Chew 1 tablet (81 mg total) by mouth daily. Patient not taking: Reported on 02/11/2016 07/23/14   [name], MD    Family History Family History  Problem Relation Age of  Onset  . Sickle cell trait Mother   . Depression Mother   . Diabetes Mother   . Sickle cell trait Father   . Sickle cell trait Brother     Social History Social History  Substance Use Topics  . Smoking status: Former Smoker    Packs/day: 0.50    Years: 10.00    Types: Cigarettes    Quit date: 05/29/2011  . Smokeless tobacco: Never Used  . Alcohol use No     Allergies   Patient has no known allergies.   Review of Systems Review of Systems  Constitutional: Negative for chills and fever.  Respiratory: Negative for cough and shortness of breath.   Cardiovascular: Negative for chest pain.  Gastrointestinal: Negative for abdominal pain, nausea and  vomiting.  Genitourinary: Negative for difficulty urinating and dysuria.  Musculoskeletal: Positive for arthralgias (bilateral ankles). Negative for neck pain and neck stiffness.       Pain at bilateral flanks, lower rib cage.   Skin: Negative for rash and wound.  Neurological: Negative for numbness.  All other systems reviewed and are negative.    Physical Exam Updated Vital Signs BP 106/76   Pulse 75   Temp 97.9 F (36.6 C) (Oral)   Resp 19   Ht 6' (1.829 m)   Wt 81.6 kg   SpO2 93%   BMI 24.41 kg/m   Physical Exam  Constitutional: He is oriented to person, place, and time. He appears well-developed and well-nourished.  Well-appearing  HENT:  Head: Normocephalic and atraumatic.  Nose: Nose normal.  Eyes: Conjunctivae and EOM are normal. Pupils are equal, round, and reactive to light.  Neck: Normal range of motion. Neck supple.  Cardiovascular: Normal rate and normal heart sounds.   Pulmonary/Chest: Effort normal and breath sounds normal. No respiratory distress. He exhibits no tenderness.  Abdominal: Soft. Bowel sounds are normal. There is no tenderness. There is no rebound and no guarding.  Musculoskeletal: Normal range of motion. He exhibits no tenderness or deformity.  There is no tenderness to the bilateral  lower rib cage. No redness, swelling, deformity.  Neurological: He is alert and oriented to person, place, and time.  Skin: Skin is warm. Capillary refill takes less than 2 seconds.  Psychiatric: He has a normal mood and affect. His behavior is normal.  Nursing note and vitals reviewed.    ED Treatments / Results  Labs (all labs ordered are listed, but only abnormal results are displayed) Labs Reviewed  CBC WITH DIFFERENTIAL/PLATELET - Abnormal; Notable for the following:       Result Value   WBC 16.2 (*)    RBC 3.03 (*)    Hemoglobin 8.8 (*)    HCT 26.3 (*)    RDW 19.2 (*)    Platelets 541 (*)    Neutro Abs 12.9 (*)    Monocytes Absolute 1.8 (*)    All other components within normal limits  RETICULOCYTES - Abnormal; Notable for the following:    Retic Ct Pct 7.3 (*)    RBC. 3.03 (*)    Retic Count, Manual 221.2 (*)    All other components within normal limits  COMPREHENSIVE METABOLIC PANEL - Abnormal; Notable for the following:    Potassium 3.2 (*)    Glucose, Bld 104 (*)    Total Bilirubin 3.1 (*)    All other components within normal limits    EKG  EKG Interpretation None       Radiology Dg Chest 2 View  Result Date: 02/19/2016 CLINICAL DATA:  Sickle cell pain crisis, worsening bilat lower chest pain since yesterday EXAM: CHEST  2 VIEW COMPARISON:  02/11/2016 FINDINGS: Left Port-A-Cath is unchanged in position. Midline trachea. Moderate cardiomegaly. Mediastinal contours otherwise within normal limits. No pleural effusion or pneumothorax. No congestive failure. Lower lobe predominant pulmonary interstitial thickening is chronic and nonspecific. No lobar consolidation. IMPRESSION: Cardiomegaly with chronic interstitial thickening. No acute findings. Electronically Signed   By: Abigail Miyamoto M.D.   On: 02/19/2016 07:18    Procedures Procedures (including critical care time)  Medications Ordered in ED Medications  HYDROmorphone (DILAUDID) injection 2 mg (2 mg  Intravenous Not Given 02/19/16 0918)    Or  HYDROmorphone (DILAUDID) injection 2 mg ( Subcutaneous See Alternative 02/19/16 0918)  HYDROmorphone (  DILAUDID) injection 2 mg (2 mg Intravenous Given 02/19/16 0656)    Or  HYDROmorphone (DILAUDID) injection 2 mg ( Subcutaneous See Alternative 02/19/16 0656)  HYDROmorphone (DILAUDID) injection 2 mg (2 mg Intravenous Given 02/19/16 0732)    Or  HYDROmorphone (DILAUDID) injection 2 mg ( Subcutaneous See Alternative 02/19/16 0732)  diphenhydrAMINE (BENADRYL) injection 25 mg (25 mg Intravenous Given 02/19/16 0656)     Initial Impression / Assessment and Plan / ED Course  I have reviewed the triage vital signs and the nursing notes.  Pertinent labs & imaging results that were available during my care of the patient were reviewed by me and considered in my medical decision making (see chart for details).    Patient is a 36 year old male well known to ED presenting with bilateral flank pain located lower rib cage bilaterally is consistent with his usual sickle cell crises pain started yesterday. Denies fever and chest pain. On exam patient is afebrile, no apparent distress, vital signs stable. Heart and lung sounds clear. Chest x-ray shows no acute findings. Lab work is mostly consistent with previous findings. Pain managed appropriately here at the Ed. He has been given 8 mg of Dilaudid. No toradol due to recent medical hx of AKI.   Upon reevaluation Patient's pain mildly improved, but still having pain. Vital signs stable, afebrile, no apparent distress. Patient requesting to go to sickle cell clinic and not to get admitted. Case discussed with Dr. Ashok Cordia.   I spoke with [place] Hollis, a representative at the sickle cell clinic, who instructed to have patient discharged and agreed to have patient transferred to her via a nurse at the sickle cell facility.    Final Clinical Impressions(s) / ED Diagnoses   Final diagnoses:  Sickle cell pain crisis [facility])     New Prescriptions Discharge Medication List as of 02/19/2016 11:05 AM       [place], [place] 02/19/16 [place], MD 02/20/16 [phone]

## 2016-02-19 NOTE — ED Notes (Signed)
[name], [place] notified that pt requests to go to the [facility] clinic. PA at bedside

## 2016-02-19 NOTE — ED Notes (Signed)
Upon entering pt room, was found that NS was hanging to gravity to pt L chest port. Infusion was stopped and ordered fluids were started with IV pump in place. Pt vss, A&O and in NAD

## 2016-02-19 NOTE — ED Triage Notes (Signed)
Pt is c/o pain to both sides and both ankles  Pt states the pain started about a week ago and is consistent with his normal sickle cell pain

## 2016-02-19 NOTE — Discharge Summary (Signed)
[facility] Discharge Summary   Patient ID: WHITE ENGELBERG MRN: [id number] DOB/AGE: 09-19-1980 36 y.o.  Admit date: 02/19/2016 Discharge date: 02/19/2016  Primary Care Physician:  [name], MD  Admission Diagnoses:  Active Problems:   Sickle cell anemia with pain [facility])  Discharge Medications:  Allergies as of 02/19/2016   No Known Allergies     Medication List    TAKE these medications   ambrisentan 5 MG tablet Commonly known as:  LETAIRIS Take 5 mg by mouth at bedtime.   aspirin 81 MG chewable tablet Chew 1 tablet (81 mg total) by mouth daily.   cholecalciferol 1000 units tablet Commonly known as:  VITAMIN D Take 2,000 Units by mouth daily.   folic acid 1 MG tablet Commonly known as:  FOLVITE Take 1 mg by mouth daily.   gabapentin 300 MG capsule Commonly known as:  NEURONTIN Take 1 capsule (300 mg total) by mouth 3 (three) times daily.   HYDROmorphone 4 MG tablet Commonly known as:  DILAUDID Take 1 tablet (4 mg total) by mouth every 4 (four) hours as needed for moderate pain or severe pain.   hydroxyurea 500 MG capsule Commonly known as:  HYDREA Take 2 capsules (1,000 mg total) by mouth daily. May take with food to minimize GI side effects. Hold Hydrea until evaluated by Primary Provider or Hematologist.   JADENU 360 MG Tabs Generic drug:  Deferasirox Take 1,080 mg by mouth daily.   morphine 30 MG 12 hr tablet Commonly known as:  MS CONTIN Take 1 tablet (30 mg total) by mouth every 12 (twelve) hours.   potassium chloride SA 20 MEQ tablet Commonly known as:  K-DUR,KLOR-CON Take 2 tablets (40 mEq total) by mouth daily.   rivaroxaban 20 MG Tabs tablet Commonly known as:  XARELTO Take 1 tablet (20 mg total) by mouth at bedtime.   torsemide 20 MG tablet Commonly known as:  DEMADEX Take 20 mg by mouth daily as needed (for weight gain greater than 3lbs.).   zolpidem 10 MG tablet Commonly known as:  AMBIEN Take 1 tablet (10 mg  total) by mouth at bedtime as needed for sleep.        Consults:  None  Significant Diagnostic Studies:  Dg Chest 2 View  Result Date: 02/19/2016 CLINICAL DATA:  Sickle cell pain crisis, worsening bilat lower chest pain since yesterday EXAM: CHEST  2 VIEW COMPARISON:  02/11/2016 FINDINGS: Left Port-A-Cath is unchanged in position. Midline trachea. Moderate cardiomegaly. Mediastinal contours otherwise within normal limits. No pleural effusion or pneumothorax. No congestive failure. Lower lobe predominant pulmonary interstitial thickening is chronic and nonspecific. No lobar consolidation. IMPRESSION: Cardiomegaly with chronic interstitial thickening. No acute findings. Electronically Signed   By: Abigail Miyamoto M.D.   On: 02/19/2016 07:18   Dg Chest 2 View  Result Date: 01/31/2016 CLINICAL DATA:  Bilateral rib pain. History of hypertension, chronic pulmonary embolism and sickle cell anemia. EXAM: CHEST  2 VIEW COMPARISON:  Chest radiograph January 27, 2016 and CT chest January 05, 2016 FINDINGS: Cardiac silhouette is moderately enlarged and unchanged. Mediastinal silhouette is nonsuspicious. Pulmonary vascular congestion without pleural effusion or focal consolidation. RIGHT mid and lower lung zone atelectasis/scarring. No pneumothorax. Single lumen LEFT chest Port-A-Cath. Surgical clips in the included right abdomen compatible with cholecystectomy. Osseous structures are nonacute. Focal sclerosis in the humeral head compatible with infarcts. IMPRESSION: Stable cardiomegaly and pulmonary vascular congestion. RIGHT atelectasis/scarring. Electronically Signed   By: Thana Farr.D.  On: 01/31/2016 04:46   US Renal  Result Date: 02/11/2016 CLINICAL DATA:  Renal failure.  Sickle cell disease EXAM: RENAL / URINARY TRACT ULTRASOUND COMPLETE COMPARISON:  CT abdomen and pelvis December 15, 2015 FINDINGS: Right Kidney: Length: 11.5 cm. Echogenicity and renal cortical thickness are within normal  limits. No mass, perinephric fluid, or hydronephrosis visualized. No sonographically demonstrable calculus or ureterectasis. Left Kidney: Length: 11.7 cm. Echogenicity and renal cortical thickness are within normal limits. No mass, perinephric fluid, or hydronephrosis visualized. No sonographically demonstrable calculus or ureterectasis. Bladder: Appears normal for degree of bladder distention. IMPRESSION: Study within normal limits. Electronically Signed   By: Lowella Grip III M.D.   On: 02/11/2016 15:20   Dg Chest Portable 1 View  Result Date: 02/11/2016 CLINICAL DATA:  Sickle cell anemia.  Hypoxia. EXAM: PORTABLE CHEST 1 VIEW COMPARISON:  01/31/2016. FINDINGS: PowerPort catheter with lead tip projected over right atrium. Cardiomegaly with normal pulmonary vascularity. No focal infiltrate. No pleural effusion or pneumothorax. IMPRESSION: 1. PowerPort catheter noted with lead tip projected over right atrium. 2. Cardiomegaly with pulmonary vascular prominence and bilateral interstitial prominence consistent with mild congestive heart failure. Electronically Signed   By: Marcello Moores  Register   On: 02/11/2016 10:22     [facility] Course: Patient transitioned to the sickle cell day infusion center from the emergency department. Patient's labs were reviewed, consistent with previous values. He was started on high concentration PCA dilaudid due to opiate tolerance. He used a total of 12.6 mg with 21 demands and 18 deliveries. Pain intensity decreased from 7/10 to 4/10. Patient will discharge home on current medication regimen. Recommend that patient follow up in clinic as scheduled. He is alert, oriented, and ambulating. He will discharge home with family in stable condition.   The patient was given clear instructions to go to ER or return to medical center if symptoms do not improve, worsen or new problems develop. The patient verbalized understanding.  Physical Exam at Discharge:  BP (!)  104/59   Pulse (!) 108   Temp 98.1 F (36.7 C) (Oral)   Resp (!) 22   SpO2 92% BP (!) 104/59   Pulse (!) 108   Temp 98.1 F (36.7 C) (Oral)   Resp (!) 22   SpO2 92%   General Appearance:    Alert, cooperative, no distress, appears stated age  Head:    Normocephalic, without obvious abnormality, atraumatic  Eyes:    PERRL, conjunctiva/corneas clear, EOM's intact, fundi    benign, both eyes       Lungs:     Clear to auscultation bilaterally, respirations unlabored  Chest wall:    No tenderness or deformity  Heart:    Regular rate and rhythm, S1 and S2 normal, no murmur, rub   or gallop  Abdomen:     Soft, non-tender, bowel sounds active all four quadrants,    no masses, no organomegaly  Extremities:   Extremities normal, atraumatic, no cyanosis or edema    Disposition at Discharge: Home  Discharge Orders: Discharge Instructions    Discharge patient    Complete by:  As directed    Discharge disposition:  01-Home or Self Care   Discharge patient date:  02/19/2016      Condition at Discharge:   Stable  Time spent on Discharge:  15 minutes  Signed: Sharanya Templin M 02/19/2016, 4:37 PM

## 2016-02-19 NOTE — H&P (Signed)
[facility] History and Physical   Date: 02/19/2016  Patient name: [name] Medical record number: [id number] Date of birth: 1980-06-25 Age: 36 y.o. Gender: male PCP: [name], MD  Attending physician: Tresa Garter, MD  Chief Complaint: Left flank pain  History of Present Illness: [name], a 36 year old male with a history of sickle cell anemia, HbSS presents with lower extremity pain. He states that pain started several days ago and is consistent with typical sickle cell pain.  He has not identified any palliative or provocative factors. Current pain intensity is 9/10 primarily left flank. Patient was evaluated in the emergency department this am. Patient received Dilaudid 8 mg IV per Pilar Plate, PA, pain intensity remains elevated.  He has been taking home medications consistently without sustained relief. He currently denies headache, neuropathy, shortness of breath, dysuria, nausea, vomiting, or diarrhea. Patient transitioned to day infusion center for extended observation.   Meds: Prescriptions Prior to Admission  Medication Sig Dispense Refill Last Dose  . ambrisentan (LETAIRIS) 5 MG tablet Take 5 mg by mouth at bedtime.   02/18/2016 at Unknown time  . cholecalciferol (VITAMIN D) 1000 units tablet Take 2,000 Units by mouth daily.   02/18/2016 at Unknown time  . Deferasirox (JADENU) 360 MG TABS Take 1,080 mg by mouth daily.    02/18/2016 at Unknown time  . folic acid (FOLVITE) 1 MG tablet Take 1 mg by mouth daily.   02/18/2016 at Unknown time  . gabapentin (NEURONTIN) 300 MG capsule Take 1 capsule (300 mg total) by mouth 3 (three) times daily. 270 capsule 3 02/18/2016 at Unknown time  . HYDROmorphone (DILAUDID) 4 MG tablet Take 1 tablet (4 mg total) by mouth every 4 (four) hours as needed for moderate pain or severe pain. 90 tablet 0 02/18/2016 at 2100  . hydroxyurea (HYDREA) 500 MG capsule Take 2 capsules (1,000 mg total) by mouth daily. May take  with food to minimize GI side effects. Hold Hydrea until evaluated by Primary Provider or Hematologist.   02/18/2016 at Unknown time  . morphine (MS CONTIN) 30 MG 12 hr tablet Take 1 tablet (30 mg total) by mouth every 12 (twelve) hours. 60 tablet 0 02/18/2016 at Unknown time  . potassium chloride SA (K-DUR,KLOR-CON) 20 MEQ tablet Take 2 tablets (40 mEq total) by mouth daily. 30 tablet 1 02/18/2016 at Unknown time  . rivaroxaban (XARELTO) 20 MG TABS tablet Take 1 tablet (20 mg total) by mouth at bedtime. 30 tablet 3 02/18/2016 at Unknown time  . torsemide (DEMADEX) 20 MG tablet Take 20 mg by mouth daily as needed (for weight gain greater than 3lbs.).    Past Week at Unknown time  . zolpidem (AMBIEN) 10 MG tablet Take 1 tablet (10 mg total) by mouth at bedtime as needed for sleep. 30 tablet 0 02/18/2016 at Unknown time  . aspirin 81 MG chewable tablet Chew 1 tablet (81 mg total) by mouth daily. (Patient not taking: Reported on 02/11/2016) 30 tablet 11 Not Taking at Unknown time    Allergies: Patient has no known allergies. Past Medical History:  Diagnosis Date  . Acute chest syndrome ([place]) 06/18/2013  . Acute embolism and thrombosis of right internal jugular vein ([place])   . Alcohol consumption of one to four drinks per day   . Avascular necrosis (H[facility])    Right Hip  . Blood transfusion   . Chronic anticoagulation   . Demand ischemia ([place]) 01/02/2014  . Former smoker   .  Functional asplenia   . Hb-SS disease with crisis ([place])   . History of Clostridium difficile infection   . History of pulmonary embolus (PE)   . Hypertension   . Hypokalemia   . Leukocytosis    Chronic  . Mood disorder ([place])   . Noncompliance with medication regimen   . Oxygen deficiency   . Pulmonary hypertension   . Second hand tobacco smoke exposure   . Sickle cell anemia (HCC)   . Sickle-cell crisis with associated acute chest syndrome ([place]) 05/13/2013  . Stroke ([place])   . Thrombocytosis (HCC)    Chronic  . Uses  marijuana    Past Surgical History:  Procedure Laterality Date  . CHOLECYSTECTOMY     01/2008  . Excision of left periauricular cyst     10/2009  . Excision of right ear lobe cyst with primary closur     11/2007  . Porta cath placement    . Porta cath removal    . PORTACATH PLACEMENT  01/05/2012   Procedure: INSERTION PORT-A-CATH;  Surgeon: Odis Hollingshead, MD;  Location: [facility];  Service: General;  Laterality: N/A;  ultrasound guiced port a cath insertion with fluoroscopy  . Right hip replacement     08/2006  . UMBILICAL HERNIA REPAIR     01/2008   Family History  Problem Relation Age of Onset  . Sickle cell trait Mother   . Depression Mother   . Diabetes Mother   . Sickle cell trait Father   . Sickle cell trait Brother    Social History   Social History  . Marital status: Single    Spouse name: N/A  . Number of children: 0  . Years of education: 26   Occupational History  . 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 Smoking status: Former Smoker    Packs/day: 0.50    Years: 10.00    Types: Cigarettes    Quit date: 05/29/2011  . Smokeless tobacco: Never Used  . Alcohol use No  . Drug use: No     Comment: Marijuana weekly  . Sexual activity: Yes    Partners: Female     Comment: month ago   Other Topics Concern  . Not on file   Social History Narrative   Lives in an apartment.  Single.  Lives alone but has a girlfriend that helps care for him.  Does not use any assist devices.        Gerald [id number]  [phone] Mom, emergency contact      [facility]:   Patient continuing to live in her apartment in town alone. 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    Review of Systems: Eyes: positive for icterus Ears, nose, mouth, throat, and face: negative for earaches, hoarseness and nasal congestion Respiratory: negative for cough and dyspnea on exertion Cardiovascular: positive for  fatigue Gastrointestinal: negative for constipation and nausea Genitourinary:negative for dysuria Integument/breast: negative Hematologic/lymphatic: negative Musculoskeletal:positive for myalgias Neurological: negative Behavioral/Psych: negative Endocrine: negative  Physical Exam: Blood pressure 110/74, pulse 86, temperature 98.2 F (36.8 C), temperature source Oral, resp. rate 18, SpO2 92 %. BP 110/74   Pulse 86   Temp 98.2 F (36.8 C) (Oral)   Resp 18   SpO2 92%   General Appearance:    Alert, cooperative, mild distress, appears older thanstated age  Head:    Normocephalic, without obvious abnormality, atraumatic  Eyes:    PERRL, conjunctiva/corneas clear, EOM's intact, fundi    Benign, icterus both eyes       Ears:    Normal TM's and external ear canals, both ears  Neck:   Supple, symmetrical, trachea midline, no adenopathy;       thyroid:  No enlargement/tenderness/nodules; no carotid   bruit or JVD  Back:     Symmetric, no curvature, ROM normal, no CVA tenderness  Lungs:     Clear to auscultation bilaterally, respirations unlabored  Chest wall:    No tenderness or deformity  Heart:    Irregular rate and rhythm  Abdomen:     Soft, bowel sounds active all four quadrants,    no masses, no organomegaly  Extremities:   Extremities normal, atraumatic, no cyanosis or edema  Pulses:   2+ and symmetric all extremities  Skin:   Skin color, texture, turgor normal, no rashes or lesions  Lymph nodes:   Cervical, supraclavicular, and axillary nodes normal  Neurologic:   CNII-XII intact. Normal strength, sensation and reflexes      throughout     Lab results: Results for orders placed or performed during the hospital encounter of 02/19/16 (from the past 24 hour(s))  CBC WITH DIFFERENTIAL     Status: Abnormal   Collection Time: 02/19/16  6:45 AM  Result Value Ref Range   WBC 16.2 (H) 4.0 - 10.5 K/uL   RBC 3.03 (L) 4.22 - 5.81 MIL/uL   Hemoglobin 8.8 (L) 13.0 - 17.0 g/dL   HCT  26.3 (L) 39.0 - 52.0 %   MCV 86.8 78.0 - 100.0 fL   MCH 29.0 26.0 - 34.0 pg   MCHC 33.5 30.0 - 36.0 g/dL   RDW 19.2 (H) 11.5 - 15.5 %   Platelets 541 (H) 150 - 400 K/uL   Neutrophils Relative % 79 %   Neutro Abs 12.9 (H) 1.7 - 7.7 K/uL   Lymphocytes Relative 8 %   Lymphs Abs 1.2 0.7 - 4.0 K/uL   Monocytes Relative 11 %   Monocytes Absolute 1.8 (H) 0.1 - 1.0 K/uL   Eosinophils Relative 2 %   Eosinophils Absolute 0.3 0.0 - 0.7 K/uL   Basophils Relative 0 %   Basophils Absolute 0.1 0.0 - 0.1 K/uL  Reticulocytes     Status: Abnormal   Collection Time: 02/19/16  6:45 AM  Result Value Ref Range   Retic Ct Pct 7.3 (H) 0.4 - 3.1 %   RBC. 3.03 (L) 4.22 - 5.81 MIL/uL   Retic Count, Manual 221.2 (H) 19.0 - 186.0 K/uL  Comprehensive metabolic panel     Status: Abnormal   Collection Time: 02/19/16  6:45 AM  Result Value Ref Range   Sodium 137 135 - 145 mmol/L   Potassium 3.2 (L) 3.5 - 5.1 mmol/L   Chloride 106 101 - 111 mmol/L   CO2 23 22 - 32 mmol/L   Glucose, Bld 104 (H) 65 - 99 mg/dL   BUN 8 6 - 20 mg/dL   Creatinine, Ser 0.77 0.61 - 1.24 mg/dL   Calcium 9.1 8.9 - 10.3 mg/dL   Total Protein 7.2 6.5 - 8.1 g/dL   Albumin 4.3 3.5 - 5.0 g/dL   AST 25 15 - 41 U/L   ALT 19 17 - 63 U/L   Alkaline Phosphatase 70 38 - 126 U/L   Total Bilirubin 3.1 (H) 0.3 - 1.2 mg/dL   GFR calc non Af Amer >60 >60 mL/min   GFR calc  Af Amer >60 >60 mL/min   Anion gap 8 5 - 15   *Note: Due to a large number of results and/or encounters for the requested time period, some results have not been displayed. A complete set of results can be found in Results Review.    Imaging results:  Dg Chest 2 View  Result Date: 02/19/2016 CLINICAL DATA:  Sickle cell pain crisis, worsening bilat lower chest pain since yesterday EXAM: CHEST  2 VIEW COMPARISON:  02/11/2016 FINDINGS: Left Port-A-Cath is unchanged in position. Midline trachea. Moderate cardiomegaly. Mediastinal contours otherwise within normal limits. No  pleural effusion or pneumothorax. No congestive failure. Lower lobe predominant pulmonary interstitial thickening is chronic and nonspecific. No lobar consolidation. IMPRESSION: Cardiomegaly with chronic interstitial thickening. No acute findings. Electronically Signed   By: Abigail Miyamoto M.D.   On: 02/19/2016 07:18     Assessment & Plan:  Patient will be admitted to the day infusion center for extended observation  Start IV D5.45 for cellular rehydration at 100/hr  Will hold Toradol for inflammation due to anti-coagulation therapy  Start Dilaudid PCA High Concentration per weight based protocol.   Patient will be re-evaluated for pain intensity in the context of function and relationship to baseline as care progresses.  If no significant pain relief, will transfer patient to inpatient services for a higher level of care.   Reviewed labs, consistent with previous values   Darilyn Storbeck M 02/19/2016, 11:56 AM

## 2016-02-19 NOTE — Progress Notes (Signed)
Pt admitted to [facility] for treatment after being discharged from [facility] Emergency Dept. C/o pain to "sides and ankles", 7/10.  Received treatment as ordered, pain 4/10 at discharge. Reviewed discharge instructions and pt verbalized understanding. Pt alert, oriented and ambulatory at time of discharge.  Coolidge Breeze, RN 02/19/2016

## 2016-02-19 NOTE — Discharge Instructions (Signed)
I spoke with a respresentative from sickle cell clinic, as per your request, who said a nurse will come to pick you up. You will be taken to the sickle cell clinic. Please make sure to try sickle cell clinic before coming the ED if it is your usual sickle cell pain, unless severe, chest pain, and/or fevers. Make sure sure to stay well hydrated throughout the day and follow up with your primary care physician tomorrow.   Get help right away if: You feel dizzy or faint. You have new abdominal pain, especially on the left side near the stomach area. You develop a persistent, often uncomfortable and painful penile erection (priapism). If this is not treated immediately it will lead to impotence. You have numbness your arms or legs or you have a hard time moving them. You have a hard time with speech. You have a fever or persistent symptoms for more than 2-3 days. You have a fever and your symptoms suddenly get worse. You have signs or symptoms of infection. These include: Chills. Abnormal tiredness (lethargy). Irritability. Poor eating. Vomiting. You develop pain that is not helped with medicine. You develop shortness of breath. You have pain in your chest. You are coughing up pus-like or bloody sputum. You develop a stiff neck. Your feet or hands swell or have pain. Your abdomen appears bloated. You develop joint pain.

## 2016-02-21 ENCOUNTER — Emergency Department (HOSPITAL_COMMUNITY): Payer: Medicare Other

## 2016-02-21 ENCOUNTER — Emergency Department (HOSPITAL_COMMUNITY)
Admission: EM | Admit: 2016-02-21 | Discharge: 2016-02-21 | Disposition: A | Discharge status: Home / Self Care | Payer: Medicare Other | Source: Home / Self Care | Attending: Emergency Medicine | Admitting: Emergency Medicine

## 2016-02-21 ENCOUNTER — Encounter (HOSPITAL_COMMUNITY): Payer: Self-pay | Admitting: Emergency Medicine

## 2016-02-21 DIAGNOSIS — R079 Chest pain, unspecified: Secondary | ICD-10-CM | POA: Diagnosis not present

## 2016-02-21 DIAGNOSIS — D5700 Hb-SS disease with crisis, unspecified: Secondary | ICD-10-CM

## 2016-02-21 DIAGNOSIS — M549 Dorsalgia, unspecified: Secondary | ICD-10-CM | POA: Diagnosis not present

## 2016-02-21 DIAGNOSIS — Z79899 Other long term (current) drug therapy: Secondary | ICD-10-CM | POA: Insufficient documentation

## 2016-02-21 DIAGNOSIS — Z7982 Long term (current) use of aspirin: Secondary | ICD-10-CM

## 2016-02-21 DIAGNOSIS — E559 Vitamin D deficiency, unspecified: Secondary | ICD-10-CM | POA: Diagnosis not present

## 2016-02-21 DIAGNOSIS — I749 Embolism and thrombosis of unspecified artery: Secondary | ICD-10-CM | POA: Insufficient documentation

## 2016-02-21 DIAGNOSIS — J810 Acute pulmonary edema: Secondary | ICD-10-CM | POA: Diagnosis present

## 2016-02-21 DIAGNOSIS — Z86711 Personal history of pulmonary embolism: Secondary | ICD-10-CM

## 2016-02-21 DIAGNOSIS — Z7901 Long term (current) use of anticoagulants: Secondary | ICD-10-CM

## 2016-02-21 DIAGNOSIS — I2729 Other secondary pulmonary hypertension: Secondary | ICD-10-CM | POA: Insufficient documentation

## 2016-02-21 DIAGNOSIS — Z96641 Presence of right artificial hip joint: Secondary | ICD-10-CM | POA: Insufficient documentation

## 2016-02-21 DIAGNOSIS — Z87891 Personal history of nicotine dependence: Secondary | ICD-10-CM | POA: Diagnosis not present

## 2016-02-21 DIAGNOSIS — Z7902 Long term (current) use of antithrombotics/antiplatelets: Secondary | ICD-10-CM | POA: Insufficient documentation

## 2016-02-21 DIAGNOSIS — F39 Unspecified mood [affective] disorder: Secondary | ICD-10-CM | POA: Diagnosis present

## 2016-02-21 DIAGNOSIS — I10 Essential (primary) hypertension: Secondary | ICD-10-CM | POA: Diagnosis not present

## 2016-02-21 DIAGNOSIS — Z8673 Personal history of transient ischemic attack (TIA), and cerebral infarction without residual deficits: Secondary | ICD-10-CM | POA: Insufficient documentation

## 2016-02-21 DIAGNOSIS — Z79891 Long term (current) use of opiate analgesic: Secondary | ICD-10-CM

## 2016-02-21 DIAGNOSIS — I272 Pulmonary hypertension, unspecified: Secondary | ICD-10-CM | POA: Diagnosis not present

## 2016-02-21 DIAGNOSIS — D72829 Elevated white blood cell count, unspecified: Secondary | ICD-10-CM | POA: Diagnosis present

## 2016-02-21 DIAGNOSIS — I2699 Other pulmonary embolism without acute cor pulmonale: Secondary | ICD-10-CM

## 2016-02-21 DIAGNOSIS — R0989 Other specified symptoms and signs involving the circulatory and respiratory systems: Secondary | ICD-10-CM | POA: Diagnosis not present

## 2016-02-21 DIAGNOSIS — J9611 Chronic respiratory failure with hypoxia: Secondary | ICD-10-CM

## 2016-02-21 DIAGNOSIS — R0902 Hypoxemia: Secondary | ICD-10-CM | POA: Diagnosis not present

## 2016-02-21 DIAGNOSIS — Z86718 Personal history of other venous thrombosis and embolism: Secondary | ICD-10-CM

## 2016-02-21 LAB — RETICULOCYTES
RBC.: 2.59 MIL/uL — ABNORMAL LOW (ref 4.22–5.81)
Retic Count, Absolute: 227.9 10*3/uL — ABNORMAL HIGH (ref 19.0–186.0)
Retic Ct Pct: 8.8 % — ABNORMAL HIGH (ref 0.4–3.1)

## 2016-02-21 LAB — COMPREHENSIVE METABOLIC PANEL
ALBUMIN: 4.1 g/dL (ref 3.5–5.0)
ALT: 20 U/L (ref 17–63)
AST: 31 U/L (ref 15–41)
Alkaline Phosphatase: 74 U/L (ref 38–126)
Anion gap: 9 (ref 5–15)
BUN: 7 mg/dL (ref 6–20)
CHLORIDE: 107 mmol/L (ref 101–111)
CO2: 21 mmol/L — AB (ref 22–32)
Calcium: 8.9 mg/dL (ref 8.9–10.3)
Creatinine, Ser: 0.62 mg/dL (ref 0.61–1.24)
GFR calc Af Amer: >60 mL/min (ref >60)
GLUCOSE: 105 mg/dL — AB (ref 65–99)
POTASSIUM: 3.1 mmol/L — AB (ref 3.5–5.1)
SODIUM: 137 mmol/L (ref 135–145)
Total Bilirubin: 4.2 mg/dL — ABNORMAL HIGH (ref 0.3–1.2)
Total Protein: 7.2 g/dL (ref 6.5–8.1)

## 2016-02-21 LAB — CBC WITH DIFFERENTIAL/PLATELET
Basophils Absolute: 0.1 10*3/uL (ref 0.0–0.1)
Basophils Relative: 1 %
EOS PCT: 2 %
Eosinophils Absolute: 0.3 10*3/uL (ref 0.0–0.7)
HCT: 22.5 % — ABNORMAL LOW (ref 39.0–52.0)
Hemoglobin: 7.7 g/dL — ABNORMAL LOW (ref 13.0–17.0)
LYMPHS ABS: 1.6 10*3/uL (ref 0.7–4.0)
LYMPHS PCT: 9 %
MCH: 29.7 pg (ref 26.0–34.0)
MCHC: 34.2 g/dL (ref 30.0–36.0)
MCV: 86.9 fL (ref 78.0–100.0)
MONOS PCT: 15 %
Monocytes Absolute: 2.7 10*3/uL — ABNORMAL HIGH (ref 0.1–1.0)
Neutro Abs: 13.1 10*3/uL — ABNORMAL HIGH (ref 1.7–7.7)
Neutrophils Relative %: 73 %
Platelets: 560 10*3/uL — ABNORMAL HIGH (ref 150–400)
RBC: 2.59 MIL/uL — AB (ref 4.22–5.81)
RDW: 19.2 % — ABNORMAL HIGH (ref 11.5–15.5)
WBC: 17.8 10*3/uL — AB (ref 4.0–10.5)

## 2016-02-21 MED ORDER — HYDROMORPHONE HCL 2 MG/ML IJ SOLN
2.0000 mg | INTRAMUSCULAR | Status: AC
  Administered 2016-02-21: 2 mg via INTRAVENOUS
  Filled 2016-02-21: qty 1

## 2016-02-21 MED ORDER — HYDROMORPHONE HCL 2 MG/ML IJ SOLN: 2.0000 mg | INTRAMUSCULAR | Status: AC

## 2016-02-21 MED ORDER — IOPAMIDOL (ISOVUE-370) INJECTION 76%
INTRAVENOUS | Status: AC
  Filled 2016-02-21: qty 100

## 2016-02-21 MED ORDER — KETOROLAC TROMETHAMINE 30 MG/ML IJ SOLN
30.0000 mg | INTRAMUSCULAR | Status: AC
  Administered 2016-02-21: 30 mg via INTRAVENOUS
  Filled 2016-02-21: qty 1

## 2016-02-21 MED ORDER — HYDROMORPHONE HCL 2 MG/ML IJ SOLN: 2.0000 mg | INTRAMUSCULAR | Status: DC

## 2016-02-21 MED ORDER — HYDROMORPHONE HCL 2 MG/ML IJ SOLN
2.0000 mg | INTRAMUSCULAR | Status: DC
  Administered 2016-02-21: 2 mg via INTRAVENOUS
  Filled 2016-02-21: qty 1

## 2016-02-21 MED ORDER — POTASSIUM CHLORIDE CRYS ER 20 MEQ PO TBCR
40.0000 meq | EXTENDED_RELEASE_TABLET | Freq: Once | ORAL | Status: AC
  Administered 2016-02-21: 40 meq via ORAL
  Filled 2016-02-21: qty 2

## 2016-02-21 MED ORDER — IOPAMIDOL (ISOVUE-370) INJECTION 76%
100.0000 mL | Freq: Once | INTRAVENOUS | Status: AC | PRN
  Administered 2016-02-21: 100 mL via INTRAVENOUS

## 2016-02-21 MED ORDER — HYDROMORPHONE HCL 1 MG/ML IJ SOLN
2.0000 mg | Freq: Once | INTRAMUSCULAR | Status: AC
  Administered 2016-02-21: 2 mg via INTRAVENOUS
  Filled 2016-02-21 (×2): qty 2

## 2016-02-21 MED ORDER — HEPARIN SOD (PORK) LOCK FLUSH 100 UNIT/ML IV SOLN
500.0000 [IU] | Freq: Once | INTRAVENOUS | Status: AC
  Administered 2016-02-21: 500 [IU]
  Filled 2016-02-21: qty 5

## 2016-02-21 MED ORDER — DIPHENHYDRAMINE HCL 50 MG/ML IJ SOLN
25.0000 mg | INTRAMUSCULAR | Status: DC | PRN
  Administered 2016-02-21: 25 mg via INTRAVENOUS
  Filled 2016-02-21: qty 1

## 2016-02-21 MED ORDER — SODIUM CHLORIDE 0.45 % IV SOLN
INTRAVENOUS | Status: DC
  Administered 2016-02-21: 07:00:00 via INTRAVENOUS

## 2016-02-21 NOTE — ED Provider Notes (Addendum)
Signed out by Dr Roxanne Mins to recheck when labs back.  Patient noted pain improved, but persists.  He does indicate that the recent chest pain and bil rib pain is more/more persistent than his normal sickle cell pain.   Chest xray results noted.  Currently, room air pulse ox 87-88%. On 2 liters, 93%.    Given chest pain, hypoxia,  infiltrate, will admit.  Sickle cell physician contacted for admission.  Discussed pt with Dr Jonelle Sidle, he indicates pt very well known to him, and that pt has chr pulm htn, chr pe, and that at baseline he has pulse ox in 80's, and that he is supposed to be on home o2 continuously, but that pt is non compliant.  He requests CTA to r/o new PE, given that pt is often non compliant w med rx.   On recheck pt, discussed admission to hospital.   Patient indicates he does not want to be admitted, or stay in ED longer, requests d/c.  He indicates he feels his breathing is at baseline, and his pain is controlled, and he wants to be d/c'd.   Patient denies cough or uri c/o. Denies any fever or chills. No current cp or discomfort.   CTA neg for PE, ground glass opac noted - discussed with Dr Jonelle Sidle - he indicates feels patient can be d/c to f/u in sickle cell clinic.  Patient requests d/c, states feels at baseline.  He is instructed to f/u sickle cell clinic tomorrow.  Patient indicates he has adequate of his home meds and home o2.        Lajean Saver, MD 02/21/16 [phone]

## 2016-02-21 NOTE — Discharge Instructions (Signed)
It was our pleasure to provide your ER care today - we hope that you feel better.  Return if re-consider, and wish to be admitted, feeling worse, fevers, increased chest pain or increased trouble breathing.  Use your home oxygen, and take your home medications as instructed by your doctors.   Follow up at the sickle cell clinic tomorrow AM for recheck.   You were given pain medication in the ER - no driving for the next 6 hours, or any time when taking narcotic pain medication.

## 2016-02-21 NOTE — ED Provider Notes (Signed)
[facility] DEPT Provider Note   CSN: [id number] Arrival date & time: 02/21/16  0417     History   Chief Complaint Chief Complaint  Patient presents with  . Chest Pain    HPI [name] is a 36 y.o. male.  He complains of pain in the lower chest and bilateral flanks which started last night. He had been admitted to the sickle cell unit 2 days ago and had generally been doing well until pain recurred last night. He has been taking hydromorphone 4 mg tablets, but only getting about one hour pain relief. He rates pain at 7/10. He denies fever, chills, sweats. He denies cough. He denies nausea, vomiting, diarrhea. 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   The history is provided by the patient.  Chest Pain      Past Medical History:  Diagnosis Date  . Acute chest syndrome ([place]) 06/18/2013  . Acute embolism and thrombosis of right internal jugular vein ([place])   . Alcohol consumption of one to four drinks per day   . Avascular necrosis (HCC)    Right Hip  . Blood transfusion   . Chronic anticoagulation   . Demand ischemia ([place]) 01/02/2014  . Former smoker   . Functional asplenia   . Hb-SS disease with crisis ([place])   . History of Clostridium difficile infection   . History of pulmonary embolus (PE)   . Hypertension   . Hypokalemia   . Leukocytosis    Chronic  . Mood disorder ([place])   . Noncompliance with medication regimen   . Oxygen deficiency   . Pulmonary hypertension   . Second hand tobacco smoke exposure   . Sickle cell anemia (HCC)   . Sickle-cell crisis with associated acute chest syndrome ([place]) 05/13/2013  . Stroke ([place])   . Thrombocytosis (HCC)    Chronic  . Uses marijuana     Patient Active Problem List   Diagnosis Date Noted  . Acute renal failure (ARF) ([place]) 02/11/2016  . Slurred speech 11/10/2015  . Ischemic stroke ([place])   . Sickle cell anemia with pain ([place]) 05/07/2015  . Anemia 01/31/2015  . Chronic anemia   .  Hb-SS disease without crisis ([place]) 10/07/2014  . Acute on chronic respiratory failure with hypoxia ([place])   . PVC's (premature ventricular contractions) 09/10/2014  . Abnormal EKG 09/10/2014  . Cor pulmonale, chronic ([place]) 08/25/2014  . Sickle cell anemia ([place]) 06/25/2014  . Anemia of chronic disease 06/25/2014  . PAH (pulmonary artery hypertension) 03/18/2014  . Chronic respiratory failure with hypoxia ([place]) 03/14/2014  . Paralytic strabismus, external ophthalmoplegia   . Chronic pain syndrome 12/12/2013  . Chronic anticoagulation 08/22/2013  . Essential hypertension 08/22/2013  . Pulmonary HTN 06/18/2013  . Functional asplenia   . Vitamin D deficiency 02/13/2013  . Hx of pulmonary embolus 06/29/2012  . Hemochromatosis 12/14/2011  . Avascular necrosis [facility])     Past Surgical History:  Procedure Laterality Date  . CHOLECYSTECTOMY     01/2008  . Excision of left periauricular cyst     10/2009  . Excision of right ear lobe cyst with primary closur     11/2007  . Porta cath placement    . Porta cath removal    . PORTACATH PLACEMENT  01/05/2012   Procedure: INSERTION PORT-A-CATH;  Surgeon: Odis Hollingshead, MD;  Location: [place];  Service: General;  Laterality: N/A;  ultrasound guiced port a cath insertion with fluoroscopy  .  Right hip replacement     08/2006  . UMBILICAL HERNIA REPAIR     01/2008       Home Medications    Prior to Admission medications   Medication Sig Start Date End Date Taking? Authorizing Provider  ambrisentan (LETAIRIS) 5 MG tablet Take 5 mg by mouth at bedtime.   Yes Historical Provider, MD  aspirin 81 MG chewable tablet Chew 1 tablet (81 mg total) by mouth daily. 07/23/14  Yes [name], MD  cholecalciferol (VITAMIN D) 1000 units tablet Take 2,000 Units by mouth daily.   Yes Historical Provider, MD  Deferasirox (JADENU) 360 MG TABS Take 1,080 mg by mouth daily.    Yes Historical Provider, MD  folic acid (FOLVITE) 1 MG tablet Take 1 mg by  mouth daily.   Yes Historical Provider, MD  gabapentin (NEURONTIN) 300 MG capsule Take 1 capsule (300 mg total) by mouth 3 (three) times daily. 06/01/15  Yes Tresa Garter, MD  hydroxyurea (HYDREA) 500 MG capsule Take 2 capsules (1,000 mg total) by mouth daily. May take with food to minimize GI side effects. Hold Hydrea until evaluated by Primary Provider or Hematologist. 12/22/15  Yes [name], MD  morphine (MS CONTIN) 30 MG 12 hr tablet Take 1 tablet (30 mg total) by mouth every 12 (twelve) hours. 02/09/16 03/10/16 Yes Tresa Garter, MD  potassium chloride SA (K-DUR,KLOR-CON) 20 MEQ tablet Take 2 tablets (40 mEq total) by mouth daily. 12/23/15  Yes [name], MD  rivaroxaban (XARELTO) 20 MG TABS tablet Take 1 tablet (20 mg total) by mouth at bedtime. 12/29/15  Yes Tresa Garter, MD  zolpidem (AMBIEN) 10 MG tablet Take 1 tablet (10 mg total) by mouth at bedtime as needed for sleep. 02/09/16 03/10/16 Yes Tresa Garter, MD    Family History Family History  Problem Relation Age of Onset  . Sickle cell trait Mother   . Depression Mother   . Diabetes Mother   . Sickle cell trait Father   . Sickle cell trait Brother     Social History Social History  Substance Use Topics  . Smoking status: Former Smoker    Packs/day: 0.50    Years: 10.00    Types: Cigarettes    Quit date: 05/29/2011  . Smokeless tobacco: Never Used  . Alcohol use No     Allergies   Patient has no known allergies.   Review of Systems Review of Systems  Cardiovascular: Positive for chest pain.  All other systems reviewed and are negative.    Physical Exam Updated Vital Signs BP 118/89 (BP Location: Right Arm)   Pulse 85   Temp 97.5 F (36.4 C) (Oral)   Resp (!) 28   SpO2 92%   Physical Exam  Nursing note and vitals reviewed.  36 year old male, resting comfortably and in no acute distress. Vital signs are significant for tachypnea. Oxygen saturation is 92%, which is  normal. Head is normocephalic and atraumatic. PERRLA, EOMI. Oropharynx is clear. Neck is nontender and supple without adenopathy or JVD. Back is nontender and there is no CVA tenderness. Lungs are clear without rales, wheezes, or rhonchi. Chest is nontender. Mediport is present in the left subclavian area. Heart has regular rate and rhythm without murmur. Abdomen is soft, flat, nontender without masses or hepatosplenomegaly and peristalsis is normoactive. Extremities have no cyanosis or edema, full range of motion is present. Skin is warm and dry without rash. Neurologic: Mental status is  normal, cranial nerves are intact, there are no motor or sensory deficits.  ED Treatments / Results  Labs (all labs ordered are listed, but only abnormal results are displayed) Labs Reviewed  CBC WITH DIFFERENTIAL/PLATELET - Abnormal; Notable for the following:       Result Value   WBC 17.8 (*)    RBC 2.59 (*)    Hemoglobin 7.7 (*)    HCT 22.5 (*)    RDW 19.2 (*)    Platelets 560 (*)    Neutro Abs 13.1 (*)    Monocytes Absolute 2.7 (*)    All other components within normal limits  RETICULOCYTES - Abnormal; Notable for the following:    Retic Ct Pct 8.8 (*)    RBC. 2.59 (*)    Retic Count, Manual 227.9 (*)    All other components within normal limits  COMPREHENSIVE METABOLIC PANEL    EKG  EKG Interpretation  Date/Time:  Sunday February 21 2016 04:24:17 EST Ventricular Rate:  91 PR Interval:    QRS Duration: 123 QT Interval:  421 QTC Calculation: 518 R Axis:   -96 Text Interpretation:  Sinus tachycardia Multiform ventricular premature complexes Nonspecific IVCD with LAD Inferior infarct, old Abnormal lateral Q waves When compared with ECG of 02/15/2016, Premature ventricular complexes are now present Confirmed by [name]  MD, Sharmin Foulk ([id number]) on 02/21/2016 4:29:13 AM       Radiology Dg Chest 2 View  Result Date: 02/19/2016 CLINICAL DATA:  Sickle cell pain crisis, worsening bilat lower  chest pain since yesterday EXAM: CHEST  2 VIEW COMPARISON:  02/11/2016 FINDINGS: Left Port-A-Cath is unchanged in position. Midline trachea. Moderate cardiomegaly. Mediastinal contours otherwise within normal limits. No pleural effusion or pneumothorax. No congestive failure. Lower lobe predominant pulmonary interstitial thickening is chronic and nonspecific. No lobar consolidation. IMPRESSION: Cardiomegaly with chronic interstitial thickening. No acute findings. Electronically Signed   By: Abigail Miyamoto M.D.   On: 02/19/2016 07:18    Procedures Procedures (including critical care time)  Medications Ordered in ED Medications  0.45 % sodium chloride infusion (not administered)  ketorolac (TORADOL) 30 MG/ML injection 30 mg (not administered)  HYDROmorphone (DILAUDID) injection 2 mg (not administered)    Or  HYDROmorphone (DILAUDID) injection 2 mg (not administered)  HYDROmorphone (DILAUDID) injection 2 mg (not administered)    Or  HYDROmorphone (DILAUDID) injection 2 mg (not administered)  HYDROmorphone (DILAUDID) injection 2 mg (not administered)    Or  HYDROmorphone (DILAUDID) injection 2 mg (not administered)  HYDROmorphone (DILAUDID) injection 2 mg (not administered)    Or  HYDROmorphone (DILAUDID) injection 2 mg (not administered)     Initial Impression / Assessment and Plan / ED Course  I have reviewed the triage vital signs and the nursing notes.  Pertinent labs & imaging results that were available during my care of the patient were reviewed by me and considered in my medical decision making (see chart for details).  Sickle cell vaso-occlusive crisis. Old records are reviewed confirming recent hospitalization for sickle cell pain as well as multiple ED visits and prior hospitalizations. He is started on the ED sickle cell protocol.  After 2 doses of hydromorphone, pain is down to 6/10. He does seem to be resting more comfortably. Case is signed out to Dr. Ashok Cordia to evaluate  response to additional dose of hydromorphone.  Final Clinical Impressions(s) / ED Diagnoses   Final diagnoses:  Sickle cell pain crisis [facility])    New Prescriptions New Prescriptions   No medications on file  [name], MD [id number]

## 2016-02-21 NOTE — ED Notes (Signed)
He returns from CT at this time. He is in no distress. 

## 2016-02-21 NOTE — ED Notes (Signed)
Patient transported to X-ray 

## 2016-02-21 NOTE — ED Triage Notes (Signed)
Pt is c/o chest and bilateral side pain  Pt states the chest pain got worse on Saturday  Pt was seen here for same on Thursday

## 2016-02-22 ENCOUNTER — Encounter (HOSPITAL_COMMUNITY): Payer: Self-pay | Admitting: *Deleted

## 2016-02-22 ENCOUNTER — Telehealth (HOSPITAL_COMMUNITY): Payer: Self-pay | Admitting: *Deleted

## 2016-02-22 ENCOUNTER — Non-Acute Institutional Stay (HOSPITAL_BASED_OUTPATIENT_CLINIC_OR_DEPARTMENT_OTHER)
Admission: AD | Admit: 2016-02-22 | Discharge: 2016-02-22 | Disposition: A | Discharge status: Home / Self Care | Payer: Medicare Other | Source: Ambulatory Visit | Attending: Internal Medicine | Admitting: Internal Medicine

## 2016-02-22 DIAGNOSIS — Z9889 Other specified postprocedural states: Secondary | ICD-10-CM | POA: Insufficient documentation

## 2016-02-22 DIAGNOSIS — Z7901 Long term (current) use of anticoagulants: Secondary | ICD-10-CM

## 2016-02-22 DIAGNOSIS — E876 Hypokalemia: Secondary | ICD-10-CM

## 2016-02-22 DIAGNOSIS — Z8619 Personal history of other infectious and parasitic diseases: Secondary | ICD-10-CM | POA: Insufficient documentation

## 2016-02-22 DIAGNOSIS — D72828 Other elevated white blood cell count: Secondary | ICD-10-CM

## 2016-02-22 DIAGNOSIS — I10 Essential (primary) hypertension: Secondary | ICD-10-CM

## 2016-02-22 DIAGNOSIS — Z818 Family history of other mental and behavioral disorders: Secondary | ICD-10-CM | POA: Insufficient documentation

## 2016-02-22 DIAGNOSIS — I272 Pulmonary hypertension, unspecified: Secondary | ICD-10-CM | POA: Insufficient documentation

## 2016-02-22 DIAGNOSIS — Z833 Family history of diabetes mellitus: Secondary | ICD-10-CM

## 2016-02-22 DIAGNOSIS — Z87891 Personal history of nicotine dependence: Secondary | ICD-10-CM | POA: Insufficient documentation

## 2016-02-22 DIAGNOSIS — R0902 Hypoxemia: Secondary | ICD-10-CM | POA: Insufficient documentation

## 2016-02-22 DIAGNOSIS — Z96641 Presence of right artificial hip joint: Secondary | ICD-10-CM | POA: Insufficient documentation

## 2016-02-22 DIAGNOSIS — Z9049 Acquired absence of other specified parts of digestive tract: Secondary | ICD-10-CM | POA: Insufficient documentation

## 2016-02-22 DIAGNOSIS — Z7982 Long term (current) use of aspirin: Secondary | ICD-10-CM | POA: Insufficient documentation

## 2016-02-22 DIAGNOSIS — Z8673 Personal history of transient ischemic attack (TIA), and cerebral infarction without residual deficits: Secondary | ICD-10-CM | POA: Insufficient documentation

## 2016-02-22 DIAGNOSIS — Z95828 Presence of other vascular implants and grafts: Secondary | ICD-10-CM

## 2016-02-22 DIAGNOSIS — Z832 Family history of diseases of the blood and blood-forming organs and certain disorders involving the immune mechanism: Secondary | ICD-10-CM | POA: Insufficient documentation

## 2016-02-22 DIAGNOSIS — D5700 Hb-SS disease with crisis, unspecified: Secondary | ICD-10-CM

## 2016-02-22 DIAGNOSIS — I2699 Other pulmonary embolism without acute cor pulmonale: Secondary | ICD-10-CM | POA: Insufficient documentation

## 2016-02-22 LAB — BASIC METABOLIC PANEL
ANION GAP: 7 (ref 5–15)
BUN: 8 mg/dL (ref 6–20)
CALCIUM: 8.9 mg/dL (ref 8.9–10.3)
CO2: 22 mmol/L (ref 22–32)
Chloride: 110 mmol/L (ref 101–111)
Creatinine, Ser: 0.76 mg/dL (ref 0.61–1.24)
Glucose, Bld: 105 mg/dL — ABNORMAL HIGH (ref 65–99)
POTASSIUM: 3.5 mmol/L (ref 3.5–5.1)
Sodium: 139 mmol/L (ref 135–145)

## 2016-02-22 MED ORDER — DIPHENHYDRAMINE HCL 50 MG/ML IJ SOLN
25.0000 mg | INTRAMUSCULAR | Status: DC | PRN
  Filled 2016-02-22: qty 0.5

## 2016-02-22 MED ORDER — HEPARIN SOD (PORK) LOCK FLUSH 100 UNIT/ML IV SOLN
500.0000 [IU] | INTRAVENOUS | Status: DC | PRN
  Filled 2016-02-22: qty 5

## 2016-02-22 MED ORDER — SODIUM CHLORIDE 0.9% FLUSH: 10.0000 mL | INTRAVENOUS | Status: DC | PRN

## 2016-02-22 MED ORDER — ONDANSETRON HCL 4 MG/2ML IJ SOLN: 4.0000 mg | Freq: Four times a day (QID) | INTRAMUSCULAR | Status: DC | PRN

## 2016-02-22 MED ORDER — HYDROMORPHONE 1 MG/ML IV SOLN
INTRAVENOUS | Status: DC
  Administered 2016-02-22: 20.3 mg via INTRAVENOUS
  Administered 2016-02-22: 10:00:00 via INTRAVENOUS
  Filled 2016-02-22: qty 25

## 2016-02-22 MED ORDER — DIPHENHYDRAMINE HCL 25 MG PO CAPS: 25.0000 mg | ORAL_CAPSULE | ORAL | Status: DC | PRN

## 2016-02-22 MED ORDER — NALOXONE HCL 0.4 MG/ML IJ SOLN: 0.4000 mg | INTRAMUSCULAR | Status: DC | PRN

## 2016-02-22 MED ORDER — SODIUM CHLORIDE 0.9% FLUSH: 9.0000 mL | INTRAVENOUS | Status: DC | PRN

## 2016-02-22 MED ORDER — SODIUM CHLORIDE 0.9% FLUSH
10.0000 mL | INTRAVENOUS | Status: AC | PRN
  Administered 2016-02-22: 10 mL

## 2016-02-22 MED ORDER — HEPARIN SOD (PORK) LOCK FLUSH 100 UNIT/ML IV SOLN
500.0000 [IU] | INTRAVENOUS | Status: AC | PRN
  Administered 2016-02-22: 500 [IU]

## 2016-02-22 MED ORDER — DEXTROSE-NACL 5-0.45 % IV SOLN
INTRAVENOUS | Status: DC
  Administered 2016-02-22: 10:00:00 via INTRAVENOUS

## 2016-02-22 NOTE — Discharge Summary (Signed)
[facility] Discharge Summary   Patient ID: [name] MRN: [id number] DOB/AGE: 37-Jun-1982 37 y.o.  Admit date: 02/22/2016 Discharge date: 02/22/2016  Primary Care Physician:  [name], MD  Admission Diagnoses:  Active Problems:   Sickle cell anemia with pain [facility])  Discharge Medications: Current Meds  Medication Sig  . ambrisentan (LETAIRIS) 5 MG tablet Take 5 mg by mouth at bedtime.  Marland Kitchen aspirin 81 MG chewable tablet Chew 1 tablet (81 mg total) by mouth daily.  . cholecalciferol (VITAMIN D) 1000 units tablet Take 2,000 Units by mouth daily.  . Deferasirox (JADENU) 360 MG TABS Take 1,080 mg by mouth daily.   . folic acid (FOLVITE) 1 MG tablet Take 1 mg by mouth daily.  Marland Kitchen gabapentin (NEURONTIN) 300 MG capsule Take 1 capsule (300 mg total) by mouth 3 (three) times daily.  . hydroxyurea (HYDREA) 500 MG capsule Take 2 capsules (1,000 mg total) by mouth daily. May take with food to minimize GI side effects. Hold Hydrea until evaluated by Primary Provider or Hematologist.  . morphine (MS CONTIN) 30 MG 12 hr tablet Take 1 tablet (30 mg total) by mouth every 12 (twelve) hours.  . potassium chloride SA (K-DUR,KLOR-CON) 20 MEQ tablet Take 2 tablets (40 mEq total) by mouth daily.  . rivaroxaban (XARELTO) 20 MG TABS tablet Take 1 tablet (20 mg total) by mouth at bedtime.  Marland Kitchen zolpidem (AMBIEN) 10 MG tablet Take 1 tablet (10 mg total) by mouth at bedtime as needed for sleep.     Consults:  None  Significant Diagnostic Studies:  Dg Chest 2 View  Result Date: 02/21/2016 CLINICAL DATA:  New onset chest pain beginning today. EXAM: CHEST  2 VIEW COMPARISON:  02/19/2016 FINDINGS: Chronic enlargement of the cardiac silhouette. Power port tip in the SVC just above the right atrium as seen previously. Pulmonary venous hypertension, possibly with early interstitial edema. No advanced edema. No consolidation or collapse. No effusions. IMPRESSION: Chronic cardiomegaly. Worsening  of the pulmonary vascular pattern suggesting venous hypertension with early interstitial edema. Electronically Signed   By: Nelson Chimes M.D.   On: 02/21/2016 07:49   Dg Chest 2 View  Result Date: 02/19/2016 CLINICAL DATA:  Sickle cell pain crisis, worsening bilat lower chest pain since yesterday EXAM: CHEST  2 VIEW COMPARISON:  02/11/2016 FINDINGS: Left Port-A-Cath is unchanged in position. Midline trachea. Moderate cardiomegaly. Mediastinal contours otherwise within normal limits. No pleural effusion or pneumothorax. No congestive failure. Lower lobe predominant pulmonary interstitial thickening is chronic and nonspecific. No lobar consolidation. IMPRESSION: Cardiomegaly with chronic interstitial thickening. No acute findings. Electronically Signed   By: Abigail Miyamoto M.D.   On: 02/19/2016 07:18   Dg Chest 2 View  Result Date: 01/31/2016 CLINICAL DATA:  Bilateral rib pain. History of hypertension, chronic pulmonary embolism and sickle cell anemia. EXAM: CHEST  2 VIEW COMPARISON:  Chest radiograph January 27, 2016 and CT chest January 05, 2016 FINDINGS: Cardiac silhouette is moderately enlarged and unchanged. Mediastinal silhouette is nonsuspicious. Pulmonary vascular congestion without pleural effusion or focal consolidation. RIGHT mid and lower lung zone atelectasis/scarring. No pneumothorax. Single lumen LEFT chest Port-A-Cath. Surgical clips in the included right abdomen compatible with cholecystectomy. Osseous structures are nonacute. Focal sclerosis in the humeral head compatible with infarcts. IMPRESSION: Stable cardiomegaly and pulmonary vascular congestion. RIGHT atelectasis/scarring. Electronically Signed   By: Elon Alas M.D.   On: 01/31/2016 04:46   Ct Angio Chest Pe W Or Wo Contrast  Result Date: 02/21/2016 CLINICAL DATA:  Sickle cell disease.  Chest pain, bilateral rib pain EXAM: CT ANGIOGRAPHY CHEST WITH CONTRAST TECHNIQUE: Multidetector CT imaging of the chest was performed using  the standard protocol during bolus administration of intravenous contrast. Multiplanar CT image reconstructions and MIPs were obtained to evaluate the vascular anatomy. CONTRAST:  100 mL Isovue 370 COMPARISON:  01/05/2016, 11/10/2015 FINDINGS: Cardiovascular: Satisfactory opacification of the central and lobar pulmonary arteries with somewhat suboptimal opacification of the subsegmental pulmonary arteries. Chronic bilateral lower lobe subsegmental occlusive pulmonary emboli. No acute pulmonary embolus. Main pulmonary artery is enlarged measuring 4.2 cm in diameter. Right heart strain (RV/ LV equals 1.0). Severe cardiomegaly. Trace pericardial effusion. Thoracic aorta is normal in caliber. No thoracic aortic dissection or aneurysm. Mediastinum/Nodes: Mild mediastinal lymphadenopathy is unchanged compared with multiple prior examinations. Left-sided Port-A-Cath with the tip at the cavoatrial junction. Normal esophagus. Normal thyroid. Lungs/Pleura: No pleural effusion or pneumothorax. Bilateral patchy areas of ground-glass opacity in the upper and lower lobes both central and peripheral distribution similar in appearance to the prior exam. No focal consolidation, pleural effusion or pneumothorax. Upper Abdomen: Involuted and calcified spleen consistent with history of sickle cell disease. No acute upper abdominal abnormality. Musculoskeletal: No acute osseous abnormality. 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 Review of the MIP images confirms the above findings. IMPRESSION: 1. No acute pulmonary embolus. Chronic bilateral lower lobe subsegmental pulmonary emboli unchanged in the prior exam. Chronic right heart strain with dilatation of the main pulmonary artery as can be seen with pulmonary arterial hypertension. 2. Stable cardiomegaly. 3. Patchy ground-glass opacities bilaterally suggesting mild pulmonary edema versus an infectious or inflammatory etiology. 4. Stable  mediastinal lymphadenopathy. Electronically Signed   By: Kathreen Devoid   On: 02/21/2016 10:46   US Renal  Result Date: 02/11/2016 CLINICAL DATA:  Renal failure.  Sickle cell disease EXAM: RENAL / URINARY TRACT ULTRASOUND COMPLETE COMPARISON:  CT abdomen and pelvis December 15, 2015 FINDINGS: Right Kidney: Length: 11.5 cm. Echogenicity and renal cortical thickness are within normal limits. No mass, perinephric fluid, or hydronephrosis visualized. No sonographically demonstrable calculus or ureterectasis. Left Kidney: Length: 11.7 cm. Echogenicity and renal cortical thickness are within normal limits. No mass, perinephric fluid, or hydronephrosis visualized. No sonographically demonstrable calculus or ureterectasis. Bladder: Appears normal for degree of bladder distention. IMPRESSION: Study within normal limits. Electronically Signed   By: Lowella Grip III M.D.   On: 02/11/2016 15:20   Dg Chest Portable 1 View  Result Date: 02/11/2016 CLINICAL DATA:  Sickle cell anemia.  Hypoxia. EXAM: PORTABLE CHEST 1 VIEW COMPARISON:  01/31/2016. FINDINGS: PowerPort catheter with lead tip projected over right atrium. Cardiomegaly with normal pulmonary vascularity. No focal infiltrate. No pleural effusion or pneumothorax. IMPRESSION: 1. PowerPort catheter noted with lead tip projected over right atrium. 2. Cardiomegaly with pulmonary vascular prominence and bilateral interstitial prominence consistent with mild congestive heart failure. Electronically Signed   By: Marcello Moores  Register   On: 02/11/2016 10:22     [facility] Course: Patient was admitted to the day infusion center for extended observation. Patient's labs were reviewed, consistent with previous values.  He was started on high concentration PCA dilaudid due to opiate tolerance. He used a total of 20.3 mg with 41 demands and 27 deliveries. Pain intensity decreased from 9/10 to 510. Patient will discharge home on current medication regimen.  Recommend that patient follow up in clinic as scheduled. He is alert, oriented, and ambulating. He will discharge home with family in  stable condition.   The patient was given clear instructions to go to ER or return to medical center if symptoms do not improve, worsen or new problems develop. The patient verbalized understanding.  Physical Exam at Discharge:  BP 120/74 (BP Location: Left Arm)   Pulse 91   Temp 98.3 F (36.8 C) (Oral)   Resp (!) 24   Ht 6' (1.829 m)   Wt 175 lb (79.4 kg)   SpO2 94%   BMI 23.73 kg/m BP 120/74 (BP Location: Left Arm)   Pulse 91   Temp 98.3 F (36.8 C) (Oral)   Resp (!) 24   Ht 6' (1.829 m)   Wt 175 lb (79.4 kg)   SpO2 94%   BMI 23.73 kg/m   General Appearance:    Alert, cooperative, no distress, appears stated age  Head:    Normocephalic, without obvious abnormality, atraumatic  Eyes:    PERRL, conjunctiva/corneas clear, EOM's intact, fundi    benign, both eyes       Lungs:     Clear to auscultation bilaterally, respirations unlabored  Chest wall:    No tenderness or deformity  Heart:    Regular rate and rhythm, S1 and S2 normal, no murmur, rub   or gallop  Abdomen:     Soft, non-tender, bowel sounds active all four quadrants,    no masses, no organomegaly  Extremities:   Extremities normal, atraumatic, no cyanosis or edema    Disposition at Discharge: Home  Discharge Orders:   Condition at Discharge:   Stable  Time spent on Discharge:  15 minutes  Signed: Daysia Vandenboom M 02/22/2016, 4:07 PM

## 2016-02-22 NOTE — Telephone Encounter (Signed)
Pt called requesting to come to the [facility] for treatment. Pt stated his pain was 9/10 in his side and legs. Pt stated he took his last pain med at 6am of Dilaudid. He denies sob, fever, chest pain, nausea, vomiting, diarrhea, abd pain or priapism. Pt stated that he is wearing his oxygen because he is suppose to not and not short of breath. Pt asked to hold on while checking with the provider to see if he can come for treatment. Pt voiced understanding.  After checking with the provider, patient was told that he could come in for treatment. Pt voiced understanding and that he can get here in 30 minutes.

## 2016-02-22 NOTE — H&P (Signed)
[facility] History and Physical   Date: 02/22/2016  Patient name: [name] Medical record number: [id number] Date of birth: [birth year] Age: 36 y.o. Gender: male PCP: [name], MD  Attending physician: Tresa Garter, MD  Chief Complaint: Left flank pain  History of Present Illness: [name], a 36 year old male with a history of sickle cell anemia, HbSS presents with lower extremity pain. He states that pain started several days ago and is consistent with typical sickle cell pain. Patient has had frequent emergency room visits and admissions. He was evaluated in the ER on 02/20/2016 for a sickle cell pain crisis, pain improved after IV Dilaudid. He says that pain intensity increased around 2 am. He had home dilaudid 4 mg this am around 6 without sustained relief.  He has not identified any palliative or provocative factors.  He currently denies headache, neuropathy, shortness of breath, dysuria, nausea, vomiting, or diarrhea. Patient transitioned to day infusion center for extended observation.   Meds: Prescriptions Prior to Admission  Medication Sig Dispense Refill Last Dose  . ambrisentan (LETAIRIS) 5 MG tablet Take 5 mg by mouth at bedtime.   02/21/2016 at Unknown time  . aspirin 81 MG chewable tablet Chew 1 tablet (81 mg total) by mouth daily. 30 tablet 11 02/21/2016 at Unknown time  . cholecalciferol (VITAMIN D) 1000 units tablet Take 2,000 Units by mouth daily.   02/21/2016 at Unknown time  . Deferasirox (JADENU) 360 MG TABS Take 1,080 mg by mouth daily.    02/21/2016 at Unknown time  . folic acid (FOLVITE) 1 MG tablet Take 1 mg by mouth daily.   02/21/2016 at Unknown time  . gabapentin (NEURONTIN) 300 MG capsule Take 1 capsule (300 mg total) by mouth 3 (three) times daily. 270 capsule 3 02/21/2016 at Unknown time  . hydroxyurea (HYDREA) 500 MG capsule Take 2 capsules (1,000 mg total) by mouth daily. May take with food to minimize GI side  effects. Hold Hydrea until evaluated by Primary Provider or Hematologist.   02/21/2016 at Unknown time  . morphine (MS CONTIN) 30 MG 12 hr tablet Take 1 tablet (30 mg total) by mouth every 12 (twelve) hours. 60 tablet 0 02/22/2016 at Unknown time  . potassium chloride SA (K-DUR,KLOR-CON) 20 MEQ tablet Take 2 tablets (40 mEq total) by mouth daily. 30 tablet 1 02/21/2016 at Unknown time  . rivaroxaban (XARELTO) 20 MG TABS tablet Take 1 tablet (20 mg total) by mouth at bedtime. 30 tablet 3 02/21/2016 at Unknown time  . zolpidem (AMBIEN) 10 MG tablet Take 1 tablet (10 mg total) by mouth at bedtime as needed for sleep. 30 tablet 0 02/21/2016 at Unknown time    Allergies: Patient has no known allergies. Past Medical History:  Diagnosis Date  . Acute chest syndrome ([place]) 06/18/2013  . Acute embolism and thrombosis of right internal jugular vein ([place])   . Alcohol consumption of one to four drinks per day   . Avascular necrosis (HCC)    Right Hip  . Blood transfusion   . Chronic anticoagulation   . Demand ischemia ([place]) 01/02/2014  . Former smoker   . Functional asplenia   . Hb-SS disease with crisis ([place])   . History of Clostridium difficile infection   . History of pulmonary embolus (PE)   . Hypertension   . Hypokalemia   . Leukocytosis    Chronic  . Mood disorder ([place])   . Noncompliance with medication regimen   .  Oxygen deficiency   . Pulmonary hypertension   . Second hand tobacco smoke exposure   . Sickle cell anemia (HCC)   . Sickle-cell crisis with associated acute chest syndrome ([place]) 05/13/2013  . Stroke ([place])   . Thrombocytosis (HCC)    Chronic  . Uses marijuana    Past Surgical History:  Procedure Laterality Date  . CHOLECYSTECTOMY     01/2008  . Excision of left periauricular cyst     10/2009  . Excision of right ear lobe cyst with primary closur     11/2007  . Porta cath placement    . Porta cath removal    . PORTACATH PLACEMENT  01/05/2012   Procedure: INSERTION  PORT-A-CATH;  Surgeon: Odis Hollingshead, MD;  Location: [place];  Service: General;  Laterality: N/A;  ultrasound guiced port a cath insertion with fluoroscopy  . Right hip replacement     08/2006  . UMBILICAL HERNIA REPAIR     01/2008   Family History  Problem Relation Age of Onset  . Sickle cell trait Mother   . Depression Mother   . Diabetes Mother   . Sickle cell trait Father   . Sickle cell trait Brother    Social History   Social History  . Marital status: Single    Spouse name: N/A  . Number of children: 0  . Years of education: 2   Occupational History  . 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 Smoking status: Former Smoker    Packs/day: 0.50    Years: 10.00    Types: Cigarettes    Quit date: 05/29/2011  . Smokeless tobacco: Never Used  . Alcohol use No  . Drug use: No     Comment: Marijuana weekly  . Sexual activity: Yes    Partners: Female     Comment: month ago   Other Topics Concern  . Not on file   Social History Narrative   Lives in an apartment.  Single.  Lives alone but has a girlfriend that helps care for him.  Does not use any assist devices.        [name]  [phone] Mom, emergency contact      [facility]:   Patient continuing to live in her apartment in town alone. 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    Review of Systems: Eyes: positive for icterus Ears, nose, mouth, throat, and face: negative for earaches, hoarseness and nasal congestion Respiratory: negative for cough and dyspnea on exertion Cardiovascular: positive for fatigue Gastrointestinal: negative for constipation and nausea Genitourinary:negative for dysuria Integument/breast: negative Hematologic/lymphatic: negative Musculoskeletal:positive for myalgias Neurological: negative Behavioral/Psych: negative Endocrine: negative  Physical Exam: Blood pressure 126/81, pulse 87, temperature 98.4 F  (36.9 C), temperature source Oral, resp. rate 20, height 6' (1.829 m), weight 175 lb (79.4 kg), SpO2 94 %. BP 126/81 (BP Location: Right Arm, Patient Position: Sitting)   Pulse 87   Temp 98.4 F (36.9 C) (Oral)   Resp 20   Ht 6' (1.829 m)   Wt 175 lb (79.4 kg)   SpO2 94%   BMI 23.73 kg/m   General Appearance:    Alert, cooperative, mild distress, appears older thanstated age  Head:    Normocephalic, without obvious abnormality, atraumatic  Eyes:    PERRL, conjunctiva/corneas clear, EOM's intact, fundi    Benign, icterus both eyes  Ears:    Normal TM's and external ear canals, both ears  Neck:   Supple, symmetrical, trachea midline, no adenopathy;       thyroid:  No enlargement/tenderness/nodules; no carotid   bruit or JVD  Back:     Symmetric, no curvature, ROM normal, no CVA tenderness  Lungs:     Clear to auscultation bilaterally, respirations unlabored  Chest wall:    No tenderness or deformity  Heart:    Irregular rate and rhythm  Abdomen:     Soft, bowel sounds active all four quadrants,    no masses, no organomegaly  Extremities:   Extremities normal, atraumatic, no cyanosis or edema  Pulses:   2+ and symmetric all extremities  Skin:   Skin color, texture, turgor normal, no rashes or lesions  Lymph nodes:   Cervical, supraclavicular, and axillary nodes normal  Neurologic:   CNII-XII intact. Normal strength, sensation and reflexes      throughout     Lab results: No results found. However, due to the size of the patient record, not all encounters were searched. Please check Results Review for a complete set of results.  Imaging results:  Dg Chest 2 View  Result Date: 02/21/2016 CLINICAL DATA:  New onset chest pain beginning today. EXAM: CHEST  2 VIEW COMPARISON:  02/19/2016 FINDINGS: Chronic enlargement of the cardiac silhouette. Power port tip in the SVC just above the right atrium as seen previously. Pulmonary venous hypertension, possibly with early interstitial  edema. No advanced edema. No consolidation or collapse. No effusions. IMPRESSION: Chronic cardiomegaly. Worsening of the pulmonary vascular pattern suggesting venous hypertension with early interstitial edema. Electronically Signed   By: Nelson Chimes M.D.   On: 02/21/2016 07:49   Ct Angio Chest Pe W Or Wo Contrast  Result Date: 02/21/2016 CLINICAL DATA:  Sickle cell disease.  Chest pain, bilateral rib pain EXAM: CT ANGIOGRAPHY CHEST WITH CONTRAST TECHNIQUE: Multidetector CT imaging of the chest was performed using the standard protocol during bolus administration of intravenous contrast. Multiplanar CT image reconstructions and MIPs were obtained to evaluate the vascular anatomy. CONTRAST:  100 mL Isovue 370 COMPARISON:  01/05/2016, 11/10/2015 FINDINGS: Cardiovascular: Satisfactory opacification of the central and lobar pulmonary arteries with somewhat suboptimal opacification of the subsegmental pulmonary arteries. Chronic bilateral lower lobe subsegmental occlusive pulmonary emboli. No acute pulmonary embolus. Main pulmonary artery is enlarged measuring 4.2 cm in diameter. Right heart strain (RV/ LV equals 1.0). Severe cardiomegaly. Trace pericardial effusion. Thoracic aorta is normal in caliber. No thoracic aortic dissection or aneurysm. Mediastinum/Nodes: Mild mediastinal lymphadenopathy is unchanged compared with multiple prior examinations. Left-sided Port-A-Cath with the tip at the cavoatrial junction. Normal esophagus. Normal thyroid. Lungs/Pleura: No pleural effusion or pneumothorax. Bilateral patchy areas of ground-glass opacity in the upper and lower lobes both central and peripheral distribution similar in appearance to the prior exam. No focal consolidation, pleural effusion or pneumothorax. Upper Abdomen: Involuted and calcified spleen consistent with history of sickle cell disease. No acute upper abdominal abnormality. Musculoskeletal: No acute osseous abnormality. Subchondral serpiginous  sclerosis in the humeral heads bilaterally consistent with AVN. Review of the MIP images confirms the above findings. IMPRESSION: 1. No acute pulmonary embolus. Chronic bilateral lower lobe subsegmental pulmonary emboli unchanged in the prior exam. Chronic right heart strain with dilatation of the main pulmonary artery as can be seen with pulmonary arterial hypertension. 2. Stable cardiomegaly. 3. Patchy ground-glass opacities bilaterally suggesting mild pulmonary edema versus an infectious or inflammatory etiology. 4. Stable mediastinal lymphadenopathy.  Electronically Signed   By: Kathreen Devoid   On: 02/21/2016 10:46     Assessment & Plan:  Patient will be admitted to the day infusion center for extended observation  Start IV D5.45 for cellular rehydration at 100/hr  Will hold Toradol for inflammation due to anti-coagulation therapy  Start Dilaudid PCA High Concentration per weight based protocol.   Patient will be re-evaluated for pain intensity in the context of function and relationship to baseline as care progresses.  If no significant pain relief, will transfer patient to inpatient services for a higher level of care.   Reviewed labs, potassium was 3.1 on 1/20, will check BMP.    Hollis,Lachina M 02/22/2016, 9:57 AM

## 2016-02-22 NOTE — Progress Notes (Signed)
Pt received to [facility] for treatment. Pt states his pain is in his sides and legs. States his pain # is 9/10. Pt was treated with IV fluids, and Dilaudid PCA. Respirations a little fast at times. Enc to use IS and cough and deep breath. Pt was alert, oriented and ambulatory at discharge. His pain was 5/10. Pt verbalized understanding of d/c instructions. And instructed to keep his using his oxygen and to get a portable oxygen tank. Pt voiced understanding.

## 2016-02-23 ENCOUNTER — Emergency Department (HOSPITAL_COMMUNITY): Payer: Medicare Other

## 2016-02-23 ENCOUNTER — Encounter (HOSPITAL_COMMUNITY): Payer: Self-pay | Admitting: Emergency Medicine

## 2016-02-23 ENCOUNTER — Inpatient Hospital Stay (HOSPITAL_COMMUNITY)
Admission: EM | Admit: 2016-02-23 | Discharge: 2016-03-01 | DRG: 811 | Disposition: A | Discharge status: Home / Self Care | Payer: Medicare Other | Attending: Internal Medicine | Admitting: Internal Medicine

## 2016-02-23 DIAGNOSIS — R0902 Hypoxemia: Secondary | ICD-10-CM

## 2016-02-23 DIAGNOSIS — D5700 Hb-SS disease with crisis, unspecified: Secondary | ICD-10-CM | POA: Diagnosis present

## 2016-02-23 DIAGNOSIS — R0989 Other specified symptoms and signs involving the circulatory and respiratory systems: Secondary | ICD-10-CM | POA: Diagnosis not present

## 2016-02-23 LAB — COMPREHENSIVE METABOLIC PANEL
ALT: 24 U/L (ref 17–63)
AST: 30 U/L (ref 15–41)
Albumin: 4.2 g/dL (ref 3.5–5.0)
Alkaline Phosphatase: 78 U/L (ref 38–126)
Anion gap: 5 (ref 5–15)
BUN: 9 mg/dL (ref 6–20)
CO2: 22 mmol/L (ref 22–32)
Calcium: 9 mg/dL (ref 8.9–10.3)
Chloride: 111 mmol/L (ref 101–111)
Creatinine, Ser: 0.73 mg/dL (ref 0.61–1.24)
GFR calc Af Amer: >60 mL/min (ref >60)
GFR calc non Af Amer: >60 mL/min (ref >60)
Glucose, Bld: 98 mg/dL (ref 65–99)
Potassium: 3.3 mmol/L — ABNORMAL LOW (ref 3.5–5.1)
Sodium: 138 mmol/L (ref 135–145)
Total Bilirubin: 4.2 mg/dL — ABNORMAL HIGH (ref 0.3–1.2)
Total Protein: 7.4 g/dL (ref 6.5–8.1)

## 2016-02-23 LAB — CBC WITH DIFFERENTIAL/PLATELET
Basophils Absolute: 0.2 10*3/uL — ABNORMAL HIGH (ref 0.0–0.1)
Basophils Relative: 1 %
Eosinophils Absolute: 0.4 10*3/uL (ref 0.0–0.7)
Eosinophils Relative: 2 %
HCT: 24 % — ABNORMAL LOW (ref 39.0–52.0)
Hemoglobin: 8.2 g/dL — ABNORMAL LOW (ref 13.0–17.0)
Lymphocytes Relative: 13 %
Lymphs Abs: 2.5 10*3/uL (ref 0.7–4.0)
MCH: 30.4 pg (ref 26.0–34.0)
MCHC: 34.2 g/dL (ref 30.0–36.0)
MCV: 88.9 fL (ref 78.0–100.0)
Monocytes Absolute: 2.3 10*3/uL — ABNORMAL HIGH (ref 0.1–1.0)
Monocytes Relative: 12 %
Neutro Abs: 13.7 10*3/uL — ABNORMAL HIGH (ref 1.7–7.7)
Neutrophils Relative %: 72 %
Platelets: 526 10*3/uL — ABNORMAL HIGH (ref 150–400)
RBC: 2.7 MIL/uL — ABNORMAL LOW (ref 4.22–5.81)
RDW: 20.6 % — ABNORMAL HIGH (ref 11.5–15.5)
WBC: 19.1 10*3/uL — ABNORMAL HIGH (ref 4.0–10.5)
nRBC: 3 /100 WBC — ABNORMAL HIGH

## 2016-02-23 LAB — RETICULOCYTES
RBC.: 2.7 MIL/uL — ABNORMAL LOW (ref 4.22–5.81)
Retic Count, Absolute: 421.2 10*3/uL — ABNORMAL HIGH (ref 19.0–186.0)
Retic Ct Pct: 15.6 % — ABNORMAL HIGH (ref 0.4–3.1)

## 2016-02-23 LAB — CBG MONITORING, ED: Glucose-Capillary: 101 mg/dL — ABNORMAL HIGH (ref 65–99)

## 2016-02-23 MED ORDER — POTASSIUM CHLORIDE CRYS ER 20 MEQ PO TBCR
40.0000 meq | EXTENDED_RELEASE_TABLET | Freq: Once | ORAL | Status: AC
Start: 2016-02-23 — End: 2016-02-25
  Administered 2016-02-25: 40 meq via ORAL
  Filled 2016-02-23 (×3): qty 2

## 2016-02-23 MED ORDER — HYDROMORPHONE HCL 2 MG/ML IJ SOLN
2.0000 mg | INTRAMUSCULAR | Status: AC
  Administered 2016-02-23: 2 mg via INTRAVENOUS
  Filled 2016-02-23: qty 1

## 2016-02-23 MED ORDER — SENNOSIDES-DOCUSATE SODIUM 8.6-50 MG PO TABS
1.0000 | ORAL_TABLET | Freq: Two times a day (BID) | ORAL | Status: DC
  Administered 2016-02-23 – 2016-03-01 (×13): 1 via ORAL
  Filled 2016-02-23 (×14): qty 1

## 2016-02-23 MED ORDER — NALOXONE HCL 0.4 MG/ML IJ SOLN: 0.4000 mg | INTRAMUSCULAR | Status: DC | PRN

## 2016-02-23 MED ORDER — AMBRISENTAN 5 MG PO TABS
5.0000 mg | ORAL_TABLET | Freq: Every day | ORAL | Status: DC
  Administered 2016-02-24 – 2016-02-29 (×6): 5 mg via ORAL
  Filled 2016-02-23: qty 1

## 2016-02-23 MED ORDER — GABAPENTIN 300 MG PO CAPS
300.0000 mg | ORAL_CAPSULE | Freq: Three times a day (TID) | ORAL | Status: DC
  Administered 2016-02-23 – 2016-03-01 (×21): 300 mg via ORAL
  Filled 2016-02-23 (×21): qty 1

## 2016-02-23 MED ORDER — ONDANSETRON HCL 4 MG/2ML IJ SOLN: 4.0000 mg | Freq: Three times a day (TID) | INTRAMUSCULAR | Status: AC | PRN

## 2016-02-23 MED ORDER — HYDROMORPHONE HCL 2 MG/ML IJ SOLN
2.0000 mg | INTRAMUSCULAR | Status: AC
  Administered 2016-02-23: 2 mg via INTRAVENOUS
  Filled 2016-02-23 (×2): qty 1

## 2016-02-23 MED ORDER — DIPHENHYDRAMINE HCL 50 MG/ML IJ SOLN
25.0000 mg | Freq: Once | INTRAMUSCULAR | Status: AC
  Administered 2016-02-23: 25 mg via INTRAVENOUS
  Filled 2016-02-23: qty 1

## 2016-02-23 MED ORDER — KETOROLAC TROMETHAMINE 30 MG/ML IJ SOLN
30.0000 mg | INTRAMUSCULAR | Status: AC
  Administered 2016-02-23: 30 mg via INTRAVENOUS
  Filled 2016-02-23: qty 1

## 2016-02-23 MED ORDER — SODIUM CHLORIDE 0.9 % IV SOLN
25.0000 mg | INTRAVENOUS | Status: DC | PRN
  Filled 2016-02-23: qty 0.5

## 2016-02-23 MED ORDER — ONDANSETRON HCL 4 MG/2ML IJ SOLN
4.0000 mg | INTRAMUSCULAR | Status: DC | PRN
  Administered 2016-02-23: 4 mg via INTRAVENOUS
  Filled 2016-02-23: qty 2

## 2016-02-23 MED ORDER — VITAMIN D3 25 MCG (1000 UNIT) PO TABS
2000.0000 [IU] | ORAL_TABLET | Freq: Every day | ORAL | Status: DC
  Administered 2016-02-24 – 2016-03-01 (×7): 2000 [IU] via ORAL
  Filled 2016-02-23 (×7): qty 2

## 2016-02-23 MED ORDER — RIVAROXABAN 20 MG PO TABS
20.0000 mg | ORAL_TABLET | Freq: Every day | ORAL | Status: DC
  Administered 2016-02-23 – 2016-02-25 (×3): 20 mg via ORAL
  Filled 2016-02-23 (×3): qty 1

## 2016-02-23 MED ORDER — HYDROMORPHONE 1 MG/ML IV SOLN
INTRAVENOUS | Status: DC
  Administered 2016-02-23: 21:00:00 via INTRAVENOUS
  Administered 2016-02-24: 3.2 mg via INTRAVENOUS
  Administered 2016-02-24: 9.6 mg via INTRAVENOUS
  Administered 2016-02-24: 3.2 mg via INTRAVENOUS
  Filled 2016-02-23: qty 25

## 2016-02-23 MED ORDER — MORPHINE SULFATE ER 30 MG PO TBCR
30.0000 mg | EXTENDED_RELEASE_TABLET | Freq: Two times a day (BID) | ORAL | Status: DC
  Administered 2016-02-23 – 2016-03-01 (×14): 30 mg via ORAL
  Filled 2016-02-23 (×14): qty 1

## 2016-02-23 MED ORDER — POLYETHYLENE GLYCOL 3350 17 G PO PACK: 17.0000 g | PACK | Freq: Every day | ORAL | Status: DC | PRN

## 2016-02-23 MED ORDER — FOLIC ACID 1 MG PO TABS
1.0000 mg | ORAL_TABLET | Freq: Every day | ORAL | Status: DC
  Administered 2016-02-24 – 2016-03-01 (×7): 1 mg via ORAL
  Filled 2016-02-23 (×7): qty 1

## 2016-02-23 MED ORDER — SODIUM CHLORIDE 0.9% FLUSH: 9.0000 mL | INTRAVENOUS | Status: DC | PRN

## 2016-02-23 MED ORDER — ZOLPIDEM TARTRATE 10 MG PO TABS
10.0000 mg | ORAL_TABLET | Freq: Every evening | ORAL | Status: DC | PRN
  Administered 2016-02-23 – 2016-02-29 (×7): 10 mg via ORAL
  Filled 2016-02-23 (×7): qty 1

## 2016-02-23 MED ORDER — DEXTROSE-NACL 5-0.45 % IV SOLN
INTRAVENOUS | Status: DC
  Administered 2016-02-23: 15:00:00 via INTRAVENOUS

## 2016-02-23 MED ORDER — KETOROLAC TROMETHAMINE 30 MG/ML IJ SOLN
30.0000 mg | Freq: Four times a day (QID) | INTRAMUSCULAR | Status: AC
  Administered 2016-02-24 – 2016-02-28 (×19): 30 mg via INTRAVENOUS
  Filled 2016-02-23 (×20): qty 1

## 2016-02-23 MED ORDER — DIPHENHYDRAMINE HCL 25 MG PO CAPS
25.0000 mg | ORAL_CAPSULE | ORAL | Status: DC | PRN
Start: 2016-02-23 — End: 2016-03-01
  Administered 2016-02-24 – 2016-02-27 (×4): 50 mg via ORAL
  Filled 2016-02-23 (×4): qty 2

## 2016-02-23 MED ORDER — HYDROXYUREA 500 MG PO CAPS
1000.0000 mg | ORAL_CAPSULE | Freq: Every day | ORAL | Status: DC
  Administered 2016-02-24 – 2016-02-29 (×6): 1000 mg via ORAL
  Filled 2016-02-23 (×6): qty 2

## 2016-02-23 MED ORDER — DEFERASIROX 360 MG PO TABS: 1080.0000 mg | ORAL_TABLET | Freq: Every day | ORAL | Status: DC

## 2016-02-23 MED ORDER — ASPIRIN 81 MG PO CHEW
81.0000 mg | CHEWABLE_TABLET | Freq: Every day | ORAL | Status: DC
Start: 2016-02-24 — End: 2016-03-01
  Administered 2016-02-24 – 2016-03-01 (×7): 81 mg via ORAL
  Filled 2016-02-23 (×7): qty 1

## 2016-02-23 NOTE — ED Triage Notes (Signed)
Pt reports back and bil side pain. Sickle cell crisis per pt. St he called the sickle cell clinic and they told him to come to the Ed.

## 2016-02-23 NOTE — ED Provider Notes (Signed)
4:07 PM Pt signed out to me at shift change. Pt with sickle cell pain. Pt is receiving IV fluids, Pain medications. So far had a dose of toradol and 1 dose of pain medication. Pt states he is not feeling better.  I was called by a nurse because pt has been hypoxic with oxygen dropping in low 80s on 6L. When I went to see pt, he was in no respiratory distress. His oxygen was 92-94% on 6l. Will add a chest xray. He is now receiving his 2nd dose of pain medications.   Little improvement after 3 doses of pain medications. Patient continued to be hypoxic, chest x-ray negative for acute process. I spoke with hospitalist, will admit patient.  Vitals:   02/23/16 1745 02/23/16 1800 02/23/16 1811 02/23/16 1843  BP:  113/85 113/85 121/88  Pulse: 77 77 76 72  Resp: 17 18 17 16   Temp:    98.5 F (36.9 C)  TempSrc:    Oral  SpO2: (!) 86% (!) 88% (!) 87% 90%  Weight:      Height:          Jeannett Senior, PA-C 02/23/16 1917    Leo Grosser, MD 02/23/16 2257

## 2016-02-23 NOTE — ED Notes (Signed)
Informed PA patient has been medicated according to protocal

## 2016-02-23 NOTE — ED Notes (Signed)
Unable to collect labs patient wants his labs collect when he gets to the back to a room

## 2016-02-23 NOTE — ED Notes (Signed)
PA request CBG, confirmed room number 24 twice.

## 2016-02-23 NOTE — H&P (Signed)
History and Physical    [name] [id number] DOB: [birth year] DOA: 02/23/2016  PCP: [name], MD  Patient coming from: Home  Chief Complaint: Back and side pain, sickle cell   HPI: [name] is a 36 y.o. male with medical history significant of sickle cell anemia who presents with acute on chronic back and side pain. He states that this is similar in presentation to his typical sickle cell pain crisis. He has been admitted under sickle cell team numerous times for this. No other complaints of chest pain, shortness of breath, cough, n/v/d, abdominal pain.    ED Course: Patient given dilaudid, toradol, IVF.   Review of Systems: As per HPI otherwise 10 point review of systems negative.   Past Medical History:  Diagnosis Date  . Acute chest syndrome ([place]) 06/18/2013  . Acute embolism and thrombosis of right internal jugular vein ([place])   . Alcohol consumption of one to four drinks per day   . Avascular necrosis (HCC)    Right Hip  . Blood transfusion   . Chronic anticoagulation   . Demand ischemia ([place]) 01/02/2014  . Former smoker   . Functional asplenia   . Hb-SS disease with crisis ([place])   . History of Clostridium difficile infection   . History of pulmonary embolus (PE)   . Hypertension   . Hypokalemia   . Leukocytosis    Chronic  . Mood disorder ([place])   . Noncompliance with medication regimen   . Oxygen deficiency   . Pulmonary hypertension   . Second hand tobacco smoke exposure   . Sickle cell anemia (HCC)   . Sickle-cell crisis with associated acute chest syndrome ([place]) 05/13/2013  . Stroke ([place])   . Thrombocytosis (HCC)    Chronic  . Uses marijuana     Past Surgical History:  Procedure Laterality Date  . CHOLECYSTECTOMY     01/2008  . Excision of left periauricular cyst     10/2009  . Excision of right ear lobe cyst with primary closur     11/2007  . Porta cath placement    . Porta cath removal    . PORTACATH PLACEMENT  01/05/2012   Procedure: INSERTION PORT-A-CATH;  Surgeon: Odis Hollingshead, MD;  Location: [facility];  Service: General;  Laterality: N/A;  ultrasound guiced port a cath insertion with fluoroscopy  . Right hip replacement     08/2006  . UMBILICAL HERNIA REPAIR     01/2008     reports that he quit smoking about 4 years ago. His smoking use included Cigarettes. He has a 5.00 pack-year smoking history. He has never used smokeless tobacco. He reports that he does not drink alcohol or use drugs.  No Known Allergies  Family History  Problem Relation Age of Onset  . Sickle cell trait Mother   . Depression Mother   . Diabetes Mother   . Sickle cell trait Father   . Sickle cell trait Brother     Prior to Admission medications   Medication Sig Start Date End Date Taking? Authorizing Provider  ambrisentan (LETAIRIS) 5 MG tablet Take 5 mg by mouth at bedtime.   Yes Historical Provider, MD  aspirin 81 MG chewable tablet Chew 1 tablet (81 mg total) by mouth daily. 07/23/14  Yes [name], MD  cholecalciferol (VITAMIN D) 1000 units tablet Take 2,000 Units by mouth daily.   Yes Historical Provider, MD  Deferasirox (JADENU) 360 MG TABS Take 1,080 mg by mouth  daily.    Yes Historical Provider, MD  folic acid (FOLVITE) 1 MG tablet Take 1 mg by mouth daily.   Yes Historical Provider, MD  gabapentin (NEURONTIN) 300 MG capsule Take 1 capsule (300 mg total) by mouth 3 (three) times daily. 06/01/15  Yes Tresa Garter, MD  hydroxyurea (HYDREA) 500 MG capsule Take 2 capsules (1,000 mg total) by mouth daily. May take with food to minimize GI side effects. Hold Hydrea until evaluated by Primary Provider or Hematologist. 12/22/15  Yes [name], MD  morphine (MS CONTIN) 30 MG 12 hr tablet Take 1 tablet (30 mg total) by mouth every 12 (twelve) hours. 02/09/16 03/10/16 Yes Tresa Garter, MD  potassium chloride SA (K-DUR,KLOR-CON) 20 MEQ tablet Take 2 tablets (40 mEq total) by mouth daily. 12/23/15  Yes  [name], MD  rivaroxaban (XARELTO) 20 MG TABS tablet Take 1 tablet (20 mg total) by mouth at bedtime. 12/29/15  Yes Tresa Garter, MD  zolpidem (AMBIEN) 10 MG tablet Take 1 tablet (10 mg total) by mouth at bedtime as needed for sleep. 02/09/16 03/10/16 Yes Tresa Garter, MD    Physical Exam: Vitals:   02/23/16 1730 02/23/16 1745 02/23/16 1800 02/23/16 1811  BP:   113/85 113/85  Pulse: 77 77 77 76  Resp: 19 17 18 17   Temp:      TempSrc:      SpO2: (!) 88% (!) 86% (!) 88% (!) 87%  Weight:      Height:        Constitutional: NAD, calm, comfortable Eyes: PERRL, lids and conjunctivae normal ENMT: Mucous membranes are moist. Posterior pharynx clear of any exudate or lesions.Normal dentition.  Neck: normal, supple, no masses, no thyromegaly Respiratory: clear to auscultation bilaterally, no wheezing, no crackles. Normal respiratory effort. No accessory muscle use.  Cardiovascular: irreg rhythm, no murmurs / rubs / gallops. No extremity edema. 2+ pedal pulses. No carotid bruits.  Abdomen: no tenderness, no masses palpated. No hepatosplenomegaly. Bowel sounds positive.  Musculoskeletal: no clubbing / cyanosis. No joint deformity upper and lower extremities. Good ROM, no contractures. Normal muscle tone.  Skin: no rashes, lesions, ulcers. No induration Neurologic: CN 2-12 grossly intact. Strength 5/5 in all 4.  Psychiatric: Normal judgment and insight. Alert and oriented x 3. Normal mood.   Labs on Admission: I have personally reviewed following labs and imaging studies  CBC:  Recent Labs Lab 02/19/16 0645 02/21/16 0656 02/23/16 1402  WBC 16.2* 17.8* 19.1*  NEUTROABS 12.9* 13.1* 13.7*  HGB 8.8* 7.7* 8.2*  HCT 26.3* 22.5* 24.0*  MCV 86.8 86.9 88.9  PLT 541* 560* 0000000*   Basic Metabolic Panel:  Recent Labs Lab 02/19/16 0645 02/21/16 0656 02/22/16 1001 02/23/16 1402  NA 137 137 139 138  K 3.2* 3.1* 3.5 3.3*  CL 106 107 110 111  CO2 23 21* 22 22  GLUCOSE  104* 105* 105* 98  BUN 8 7 8 9   CREATININE 0.77 0.62 0.76 0.73  CALCIUM 9.1 8.9 8.9 9.0   GFR: Estimated Creatinine Clearance: 140.1 mL/min (by C-G formula based on SCr of 0.73 mg/dL). Liver Function Tests:  Recent Labs Lab 02/19/16 0645 02/21/16 0656 02/23/16 1402  AST 25 31 30   ALT 19 20 24   ALKPHOS 70 74 78  BILITOT 3.1* 4.2* 4.2*  PROT 7.2 7.2 7.4  ALBUMIN 4.3 4.1 4.2   No results for input(s): LIPASE, AMYLASE in the last 168 hours. No results for input(s): AMMONIA in the last  168 hours. Coagulation Profile: No results for input(s): INR, PROTIME in the last 168 hours. Cardiac Enzymes: No results for input(s): CKTOTAL, CKMB, CKMBINDEX, TROPONINI in the last 168 hours. BNP (last 3 results) No results for input(s): PROBNP in the last 8760 hours. HbA1C: No results for input(s): HGBA1C in the last 72 hours. CBG:  Recent Labs Lab 02/23/16 1439  GLUCAP 101*   Lipid Profile: No results for input(s): CHOL, HDL, LDLCALC, TRIG, CHOLHDL, LDLDIRECT in the last 72 hours. Thyroid Function Tests: No results for input(s): TSH, T4TOTAL, FREET4, T3FREE, THYROIDAB in the last 72 hours. Anemia Panel:  Recent Labs  02/21/16 0656 02/23/16 1402  RETICCTPCT 8.8* 15.6*   Urine analysis:    Component Value Date/Time   COLORURINE AMBER (A) 02/12/2016 0926   APPEARANCEUR HAZY (A) 02/12/2016 0926   LABSPEC 1.015 02/12/2016 0926   PHURINE 5.0 02/12/2016 0926   GLUCOSEU NEGATIVE 02/12/2016 0926   HGBUR MODERATE (A) 02/12/2016 0926   BILIRUBINUR NEGATIVE 02/12/2016 0926   KETONESUR NEGATIVE 02/12/2016 0926   PROTEINUR 30 (A) 02/12/2016 0926   UROBILINOGEN 0.2 03/24/2015 1426   NITRITE NEGATIVE 02/12/2016 0926   LEUKOCYTESUR NEGATIVE 02/12/2016 0926   Sepsis Labs: !!!!!!!!!!!!!!!!!!!!!!!!!!!!!!!!!!!!!!!!!!!! @LABRCNTIP (procalcitonin:4,lacticidven:4) )No results found for this or any previous visit (from the past 240 hour(s)).   Radiological Exams on Admission: Dg Chest 2  View  Result Date: 02/23/2016 CLINICAL DATA:  SICKLE CELL CRISIS EXAM: CHEST  2 VIEW COMPARISON:  CT 02/21/2016 FINDINGS: LEFT-SIDED POWER PORT UNCHANGED. STABLE CARDIAC SILHOUETTE. THERE IS MILD CENTRAL VENOUS CONGESTION. NO FOCAL INFILTRATE. NO PLEURAL FLUID. NO PNEUMOTHORAX. IMPRESSION: 1. STABLE CARDIOMEGALY. 2. STABLE CENTRAL VENOUS PULMONARY CONGESTION. 3. NO EVIDENCE OF PULMONARY INFILTRATE Electronically Signed   By: Suzy Bouchard M.D.   On: 02/23/2016 16:35    Assessment/Plan Principal Problem:   Sickle cell crisis (HCC)  Sickle cell crisis -Will utilize sickle cell order set  -PCA for opioid tolerant adult -IV D5-1/2NS  -Continue home meds    DVT prophylaxis: Xarelto Code Status: Full  Family Communication: no family at bedside Disposition Plan: pending improvement Consults called: none, sickle cell team to see in AM  Admission status: observation status    Dessa Phi, DO [facility] [email or web address] Password [facility] 02/23/2016, 6:25 PM

## 2016-02-23 NOTE — ED Provider Notes (Signed)
[facility] DEPT Provider Note   CSN: [id number] Arrival date & time: 02/23/16  1015  History   Chief Complaint Chief Complaint  Patient presents with  . Sickle Cell Pain Crisis    HPI [name] is a 36 y.o. male.  HPI   Patient has PMH of acute chest syndrome, avascular necrosis, blood transfusion, chronic anticoagulation, demand ischemia, former smoker, hypertension, mood disorder, sickle cell disease, stroke, marijuana uses comes to the ER for sickle cell crisis. Pain to lower ribs and low back which he describes as his typical crisis. Has been persisting for 1 week. Was seen at [facility] yesterday feeling a bit better and was told if he continued to have crisis or if it returned that he needed to go to the ER. He denies CP, cough, le swelling, SOB, new symptoms, headache, fevers, weakness or confusion. Says home meds are not working.  Past Medical History:  Diagnosis Date  . Acute chest syndrome ([place]) 06/18/2013  . Acute embolism and thrombosis of right internal jugular vein ([place])   . Alcohol consumption of one to four drinks per day   . Avascular necrosis (HCC)    Right Hip  . Blood transfusion   . Chronic anticoagulation   . Demand ischemia ([place]) 01/02/2014  . Former smoker   . Functional asplenia   . Hb-SS disease with crisis ([place])   . History of Clostridium difficile infection   . History of pulmonary embolus (PE)   . Hypertension   . Hypokalemia   . Leukocytosis    Chronic  . Mood disorder ([place])   . Noncompliance with medication regimen   . Oxygen deficiency   . Pulmonary hypertension   . Second hand tobacco smoke exposure   . Sickle cell anemia (HCC)   . Sickle-cell crisis with associated acute chest syndrome ([place]) 05/13/2013  . Stroke ([place])   . Thrombocytosis (HCC)    Chronic  . Uses marijuana     Patient Active Problem List   Diagnosis Date Noted  . Acute renal failure (ARF) ([place]) 02/11/2016  . Slurred speech 11/10/2015  . Ischemic  stroke ([place])   . Sickle cell anemia with pain ([place]) 05/07/2015  . Anemia 01/31/2015  . Chronic anemia   . Hb-SS disease without crisis ([place]) 10/07/2014  . Acute on chronic respiratory failure with hypoxia ([place])   . PVC's (premature ventricular contractions) 09/10/2014  . Abnormal EKG 09/10/2014  . Cor pulmonale, chronic ([place]) 08/25/2014  . Sickle cell anemia ([place]) 06/25/2014  . Anemia of chronic disease 06/25/2014  . PAH (pulmonary artery hypertension) 03/18/2014  . Chronic respiratory failure with hypoxia ([place]) 03/14/2014  . Paralytic strabismus, external ophthalmoplegia   . Chronic pain syndrome 12/12/2013  . Chronic anticoagulation 08/22/2013  . Essential hypertension 08/22/2013  . Pulmonary HTN 06/18/2013  . Functional asplenia   . Vitamin D deficiency 02/13/2013  . Hx of pulmonary embolus 06/29/2012  . Hemochromatosis 12/14/2011  . Avascular necrosis [facility])     Past Surgical History:  Procedure Laterality Date  . CHOLECYSTECTOMY     01/2008  . Excision of left periauricular cyst     10/2009  . Excision of right ear lobe cyst with primary closur     11/2007  . Porta cath placement    . Porta cath removal    . PORTACATH PLACEMENT  01/05/2012   Procedure: INSERTION PORT-A-CATH;  Surgeon: Odis Hollingshead, MD;  Location: [facility];  Service: General;  Laterality: N/A;  ultrasound guiced  port a cath insertion with fluoroscopy  . Right hip replacement     08/2006  . UMBILICAL HERNIA REPAIR     01/2008      Home Medications    Prior to Admission medications   Medication Sig Start Date End Date Taking? Authorizing Provider  ambrisentan (LETAIRIS) 5 MG tablet Take 5 mg by mouth at bedtime.   Yes Historical Provider, MD  aspirin 81 MG chewable tablet Chew 1 tablet (81 mg total) by mouth daily. 07/23/14  Yes [name], MD  cholecalciferol (VITAMIN D) 1000 units tablet Take 2,000 Units by mouth daily.   Yes Historical Provider, MD  Deferasirox (JADENU) 360 MG TABS Take  1,080 mg by mouth daily.    Yes Historical Provider, MD  folic acid (FOLVITE) 1 MG tablet Take 1 mg by mouth daily.   Yes Historical Provider, MD  gabapentin (NEURONTIN) 300 MG capsule Take 1 capsule (300 mg total) by mouth 3 (three) times daily. 06/01/15  Yes Tresa Garter, MD  hydroxyurea (HYDREA) 500 MG capsule Take 2 capsules (1,000 mg total) by mouth daily. May take with food to minimize GI side effects. Hold Hydrea until evaluated by Primary Provider or Hematologist. 12/22/15  Yes [name], MD  morphine (MS CONTIN) 30 MG 12 hr tablet Take 1 tablet (30 mg total) by mouth every 12 (twelve) hours. 02/09/16 03/10/16 Yes Tresa Garter, MD  potassium chloride SA (K-DUR,KLOR-CON) 20 MEQ tablet Take 2 tablets (40 mEq total) by mouth daily. 12/23/15  Yes [name], MD  rivaroxaban (XARELTO) 20 MG TABS tablet Take 1 tablet (20 mg total) by mouth at bedtime. 12/29/15  Yes Tresa Garter, MD  zolpidem (AMBIEN) 10 MG tablet Take 1 tablet (10 mg total) by mouth at bedtime as needed for sleep. 02/09/16 03/10/16 Yes Tresa Garter, MD    Family History Family History  Problem Relation Age of Onset  . Sickle cell trait Mother   . Depression Mother   . Diabetes Mother   . Sickle cell trait Father   . Sickle cell trait Brother     Social History Social History  Substance Use Topics  . Smoking status: Former Smoker    Packs/day: 0.50    Years: 10.00    Types: Cigarettes    Quit date: 05/29/2011  . Smokeless tobacco: Never Used  . Alcohol use No     Allergies   Patient has no known allergies.   Review of Systems Review of Systems Review of Systems All other systems negative except as documented in the HPI. All pertinent positives and negatives as reviewed in the HPI.   Physical Exam Updated Vital Signs BP 135/95   Pulse 83   Temp 98.5 F (36.9 C) (Oral)   Resp (!) 27   Ht 6' (1.829 m)   Wt 79.4 kg   SpO2 93%   BMI 23.73 kg/m   Physical Exam    Constitutional: He appears well-developed and well-nourished. He appears distressed (due to pain).  HENT:  Head: Normocephalic and atraumatic.  Right Ear: Tympanic membrane and ear canal normal.  Left Ear: Tympanic membrane and ear canal normal.  Nose: Nose normal.  Mouth/Throat: Uvula is midline, oropharynx is clear and moist and mucous membranes are normal.  Eyes: Pupils are equal, round, and reactive to light.  Neck: Normal range of motion. Neck supple.  Cardiovascular: Regular rhythm.  Tachycardia present.   Pulmonary/Chest: Effort normal. Tachypnea (on nasal cannula) noted.  Abdominal:  Soft.  No signs of abdominal distention  Musculoskeletal:  No LE swelling  Neurological: He is alert.  Acting at baseline  Skin: Skin is warm and dry. No rash noted.  Nursing note and vitals reviewed.   ED Treatments / Results  Labs (all labs ordered are listed, but only abnormal results are displayed) Labs Reviewed  COMPREHENSIVE METABOLIC PANEL  CBC WITH DIFFERENTIAL/PLATELET  RETICULOCYTES    EKG  EKG Interpretation None       Radiology No results found.  Procedures Procedures (including critical care time)   Initial Impression / Assessment and Plan / ED Course  I have reviewed the triage vital signs and the nursing notes.  Pertinent labs & imaging results that were available during my care of the patient were reviewed by me and considered in my medical decision making (see chart for details).    3:00 pm Initiated sickle cell protocol, patient signed out with Tatyana K. PA-C.  Blood pressure 135/95, pulse 83, temperature 98.5 F (36.9 C), temperature source Oral, resp. rate (!) 27, height 6' (1.829 m), weight 79.4 kg, SpO2 93 %.   Final Clinical Impressions(s) / ED Diagnoses   Final diagnoses:  Sickle cell crisis [facility])    New Prescriptions New Prescriptions   No medications on file     Delos Haring, Hershal Coria 02/23/16 [place], MD 02/26/16  1046

## 2016-02-23 NOTE — ED Notes (Signed)
Increased patient to [name].  Made PA aware of low saturations.  Pt also requested more benadryl for itching.

## 2016-02-24 DIAGNOSIS — I2729 Other secondary pulmonary hypertension: Secondary | ICD-10-CM | POA: Diagnosis present

## 2016-02-24 DIAGNOSIS — R0902 Hypoxemia: Secondary | ICD-10-CM

## 2016-02-24 DIAGNOSIS — I2721 Secondary pulmonary arterial hypertension: Secondary | ICD-10-CM | POA: Diagnosis not present

## 2016-02-24 DIAGNOSIS — Z86718 Personal history of other venous thrombosis and embolism: Secondary | ICD-10-CM | POA: Diagnosis not present

## 2016-02-24 DIAGNOSIS — Z79891 Long term (current) use of opiate analgesic: Secondary | ICD-10-CM | POA: Diagnosis not present

## 2016-02-24 DIAGNOSIS — Z96641 Presence of right artificial hip joint: Secondary | ICD-10-CM | POA: Diagnosis present

## 2016-02-24 DIAGNOSIS — F39 Unspecified mood [affective] disorder: Secondary | ICD-10-CM | POA: Diagnosis present

## 2016-02-24 DIAGNOSIS — M549 Dorsalgia, unspecified: Secondary | ICD-10-CM | POA: Diagnosis not present

## 2016-02-24 DIAGNOSIS — Z7901 Long term (current) use of anticoagulants: Secondary | ICD-10-CM

## 2016-02-24 DIAGNOSIS — R701 Abnormal plasma viscosity: Secondary | ICD-10-CM | POA: Diagnosis not present

## 2016-02-24 DIAGNOSIS — Z7982 Long term (current) use of aspirin: Secondary | ICD-10-CM | POA: Diagnosis not present

## 2016-02-24 DIAGNOSIS — K766 Portal hypertension: Secondary | ICD-10-CM

## 2016-02-24 DIAGNOSIS — Z79899 Other long term (current) drug therapy: Secondary | ICD-10-CM | POA: Diagnosis not present

## 2016-02-24 DIAGNOSIS — Z87891 Personal history of nicotine dependence: Secondary | ICD-10-CM | POA: Diagnosis not present

## 2016-02-24 DIAGNOSIS — J810 Acute pulmonary edema: Secondary | ICD-10-CM | POA: Diagnosis not present

## 2016-02-24 DIAGNOSIS — Z8673 Personal history of transient ischemic attack (TIA), and cerebral infarction without residual deficits: Secondary | ICD-10-CM | POA: Diagnosis not present

## 2016-02-24 DIAGNOSIS — D5700 Hb-SS disease with crisis, unspecified: Principal | ICD-10-CM

## 2016-02-24 DIAGNOSIS — D638 Anemia in other chronic diseases classified elsewhere: Secondary | ICD-10-CM | POA: Diagnosis not present

## 2016-02-24 DIAGNOSIS — D72829 Elevated white blood cell count, unspecified: Secondary | ICD-10-CM | POA: Diagnosis not present

## 2016-02-24 DIAGNOSIS — I2781 Cor pulmonale (chronic): Secondary | ICD-10-CM | POA: Diagnosis not present

## 2016-02-24 DIAGNOSIS — E559 Vitamin D deficiency, unspecified: Secondary | ICD-10-CM | POA: Diagnosis present

## 2016-02-24 DIAGNOSIS — I10 Essential (primary) hypertension: Secondary | ICD-10-CM | POA: Diagnosis present

## 2016-02-24 DIAGNOSIS — Z86711 Personal history of pulmonary embolism: Secondary | ICD-10-CM | POA: Diagnosis not present

## 2016-02-24 LAB — CBC WITH DIFFERENTIAL/PLATELET
BASOS ABS: 0.2 10*3/uL — AB (ref 0.0–0.1)
Basophils Relative: 1 %
EOS PCT: 3 %
Eosinophils Absolute: 0.6 10*3/uL (ref 0.0–0.7)
HCT: 23.1 % — ABNORMAL LOW (ref 39.0–52.0)
HEMOGLOBIN: 7.7 g/dL — AB (ref 13.0–17.0)
LYMPHS ABS: 2.9 10*3/uL (ref 0.7–4.0)
LYMPHS PCT: 14 %
MCH: 29.3 pg (ref 26.0–34.0)
MCHC: 33.3 g/dL (ref 30.0–36.0)
MCV: 87.8 fL (ref 78.0–100.0)
MONOS PCT: 13 %
Monocytes Absolute: 2.7 10*3/uL — ABNORMAL HIGH (ref 0.1–1.0)
Neutro Abs: 14 10*3/uL — ABNORMAL HIGH (ref 1.7–7.7)
Neutrophils Relative %: 69 %
PLATELETS: 528 10*3/uL — AB (ref 150–400)
RBC: 2.63 MIL/uL — AB (ref 4.22–5.81)
RDW: 19.6 % — ABNORMAL HIGH (ref 11.5–15.5)
WBC: 20.4 10*3/uL — AB (ref 4.0–10.5)

## 2016-02-24 LAB — BASIC METABOLIC PANEL
ANION GAP: 9 (ref 5–15)
BUN: 8 mg/dL (ref 6–20)
CHLORIDE: 107 mmol/L (ref 101–111)
CO2: 21 mmol/L — ABNORMAL LOW (ref 22–32)
Calcium: 8.7 mg/dL — ABNORMAL LOW (ref 8.9–10.3)
Creatinine, Ser: 0.92 mg/dL (ref 0.61–1.24)
GFR calc Af Amer: >60 mL/min (ref >60)
Glucose, Bld: 124 mg/dL — ABNORMAL HIGH (ref 65–99)
POTASSIUM: 3.4 mmol/L — AB (ref 3.5–5.1)
SODIUM: 137 mmol/L (ref 135–145)

## 2016-02-24 LAB — RETICULOCYTES
RBC.: 2.56 MIL/uL — AB (ref 4.22–5.81)
RETIC CT PCT: 16.7 % — AB (ref 0.4–3.1)
Retic Count, Absolute: 427.5 10*3/uL — ABNORMAL HIGH (ref 19.0–186.0)

## 2016-02-24 MED ORDER — FUROSEMIDE 10 MG/ML IJ SOLN
40.0000 mg | Freq: Once | INTRAMUSCULAR | Status: AC
  Administered 2016-02-24: 40 mg via INTRAVENOUS
  Filled 2016-02-24: qty 4

## 2016-02-24 MED ORDER — HYDROMORPHONE 1 MG/ML IV SOLN
INTRAVENOUS | Status: DC
  Administered 2016-02-24: 12:00:00 via INTRAVENOUS
  Administered 2016-02-24: 2.1 mg via INTRAVENOUS
  Administered 2016-02-24: 9.6 mg via INTRAVENOUS
  Administered 2016-02-25: 8.4 mg via INTRAVENOUS
  Administered 2016-02-25: 06:00:00 via INTRAVENOUS
  Administered 2016-02-25: 4.2 mg via INTRAVENOUS
  Administered 2016-02-25: 2.1 mg via INTRAVENOUS
  Administered 2016-02-25: 11.2 mg via INTRAVENOUS
  Filled 2016-02-24 (×2): qty 25

## 2016-02-24 NOTE — Progress Notes (Signed)
Patient O2 sats between 87-90% on 5L O2 via [place]. On call aware and increased O2 to 6L via [place] to maintain sats at 90%. Patient denies SOB. No respiratory distress noted and encouraged to deep breath. Continues to fluctuate between 88-91% on 6L via [place]. Per On Call, may place on Venti-mask to maintain O2 sats. Respiratory notified. Will continue to monitor.

## 2016-02-24 NOTE — Progress Notes (Signed)
Patient is on 4L of oxygen ,sating 88% to 90% ,the on call MD notified. Will keep monitoring

## 2016-02-24 NOTE — Progress Notes (Signed)
SICKLE CELL SERVICE PROGRESS NOTE  [name] [id number] DOB: August 19, 1980 DOA: 02/23/2016 PCP: [name], MD  Assessment/Plan: Principal Problem:   Sickle cell crisis ([place]) Active Problems:   Hb-SS disease with crisis ([place])  1. Hypoxia: Pt had a CXR on admission which was negative for an acute process however a CTA from 1/21/ did show ground glass opacities which likely represent pulmonary edema. Will decrease fluids to [facility], give a dose of Lasix and continue supportive Oxygen. He has not been using the ICS so will encourage more aggressive use of the ICS. No indication for antibiotics at this time.  2. Hb SS with crisis: Pt has continued pain in the ribs and back. Will continue PCA at current dose,continue Toradol and decrease IVF. Pt had aggressive hydration the day before admission and is at risk of cell swelling with continued hydration.  3. Leukocytosis: Mild increase over baseline. Likely related to crisis. No evidence of infection.  4. Anemia of Chronic Disease: Hb currently at baseline.  5. Chronic pain: Continue MS Contin.  6. Chronic Anticoagulation: Continue Xarelto  Code Status: Full Code Family Communication: N/A Disposition Plan: Not yet ready for discharge  [place].  Pager [phone]. If 7PM-7AM, please contact night-coverage.  02/24/2016, 4:54 PM  LOS: 0 days   Interim History: Pt still c/o pain in ribs and back at 8/10. He  Has not used his PCA maximally and admits that he has not been using the ICS. Encouraged use of ICS.   Consultants:  None  Procedures:  None  Antibiotics:  None    Objective: Vitals:   02/24/16 0802 02/24/16 1026 02/24/16 1224 02/24/16 1405  BP:  117/87  126/89  Pulse:  95  90  Resp: 20 20 14 20   Temp:  98.2 F (36.8 C)  98.1 F (36.7 C)  TempSrc:  Oral  Oral  SpO2: 90% 90% 90% 92%  Weight:      Height:       Weight change:   Intake/Output Summary (Last 24 hours) at 02/24/16 1654 Last data filed  at 02/24/16 1411  Gross per 24 hour  Intake              240 ml  Output              701 ml  Net             -461 ml    General: Alert, awake, oriented x3, in no acute distress.  HEENT: [place]/AT PEERL, EOMI, icteric at baseline. Neck: Trachea midline,  no masses, no thyromegal,y no JVD, no carotid bruit OROPHARYNX:  Moist, No exudate/ erythema/lesions.  Heart: Regular rate and rhythm, without murmurs, rubs, gallops, PMI non-displaced, no heaves or thrills on palpation.  Lungs: Clear to auscultation, no wheezing or rhonchi noted. No increased vocal fremitus resonant to percussion  Abdomen: Soft, nontender, nondistended, positive bowel sounds, no masses no hepatosplenomegaly noted..  Neuro: No focal neurological deficits noted cranial nerves II through XII grossly intact.  Strength at functional baseline in bilateral upper and lower extremities. Musculoskeletal: No warmth swelling or erythema around joints, no spinal tenderness noted. Psychiatric: Patient alert and oriented x3, good insight and cognition, good recent to remote recall.    Data Reviewed: Basic Metabolic Panel:  Recent Labs Lab 02/19/16 0645 02/21/16 0656 02/22/16 1001 02/23/16 1402 02/24/16 0617  NA 137 137 139 138 137  K 3.2* 3.1* 3.5 3.3* 3.4*  CL 106 107 110 111 107  CO2 23 21*  22 22 21*  GLUCOSE 104* 105* 105* 98 124*  BUN 8 7 8 9 8   CREATININE 0.77 0.62 0.76 0.73 0.92  CALCIUM 9.1 8.9 8.9 9.0 8.7*   Liver Function Tests:  Recent Labs Lab 02/19/16 0645 02/21/16 0656 02/23/16 1402  AST 25 31 30   ALT 19 20 24   ALKPHOS 70 74 78  BILITOT 3.1* 4.2* 4.2*  PROT 7.2 7.2 7.4  ALBUMIN 4.3 4.1 4.2   No results for input(s): LIPASE, AMYLASE in the last 168 hours. No results for input(s): AMMONIA in the last 168 hours. CBC:  Recent Labs Lab 02/19/16 0645 02/21/16 0656 02/23/16 1402 02/24/16 0617  WBC 16.2* 17.8* 19.1* 20.4*  NEUTROABS 12.9* 13.1* 13.7* 14.0*  HGB 8.8* 7.7* 8.2* 7.7*  HCT 26.3* 22.5*  24.0* 23.1*  MCV 86.8 86.9 88.9 87.8  PLT 541* 560* 526* 528*   Cardiac Enzymes: No results for input(s): CKTOTAL, CKMB, CKMBINDEX, TROPONINI in the last 168 hours. BNP (last 3 results)  Recent Labs  08/02/15 2307 11/28/15 0520 02/11/16 1006  BNP 503.2* 422.5* 381.0*    ProBNP (last 3 results) No results for input(s): PROBNP in the last 8760 hours.  CBG:  Recent Labs Lab 02/23/16 1439  GLUCAP 101*    No results found for this or any previous visit (from the past 240 hour(s)).   Studies: Dg Chest 2 View  Result Date: 02/23/2016 CLINICAL DATA:  SICKLE CELL CRISIS EXAM: CHEST  2 VIEW COMPARISON:  CT 02/21/2016 FINDINGS: LEFT-SIDED POWER PORT UNCHANGED. STABLE CARDIAC SILHOUETTE. THERE IS MILD CENTRAL VENOUS CONGESTION. NO FOCAL INFILTRATE. NO PLEURAL FLUID. NO PNEUMOTHORAX. IMPRESSION: 1. STABLE CARDIOMEGALY. 2. STABLE CENTRAL VENOUS PULMONARY CONGESTION. 3. NO EVIDENCE OF PULMONARY INFILTRATE Electronically Signed   By: Suzy Bouchard M.D.   On: 02/23/2016 16:35   Dg Chest 2 View  Result Date: 02/21/2016 CLINICAL DATA:  New onset chest pain beginning today. EXAM: CHEST  2 VIEW COMPARISON:  02/19/2016 FINDINGS: Chronic enlargement of the cardiac silhouette. Power port tip in the SVC just above the right atrium as seen previously. Pulmonary venous hypertension, possibly with early interstitial edema. No advanced edema. No consolidation or collapse. No effusions. IMPRESSION: Chronic cardiomegaly. Worsening of the pulmonary vascular pattern suggesting venous hypertension with early interstitial edema. Electronically Signed   By: Nelson Chimes M.D.   On: 02/21/2016 07:49   Dg Chest 2 View  Result Date: 02/19/2016 CLINICAL DATA:  Sickle cell pain crisis, worsening bilat lower chest pain since yesterday EXAM: CHEST  2 VIEW COMPARISON:  02/11/2016 FINDINGS: Left Port-A-Cath is unchanged in position. Midline trachea. Moderate cardiomegaly. Mediastinal contours otherwise within normal  limits. No pleural effusion or pneumothorax. No congestive failure. Lower lobe predominant pulmonary interstitial thickening is chronic and nonspecific. No lobar consolidation. IMPRESSION: Cardiomegaly with chronic interstitial thickening. No acute findings. Electronically Signed   By: Abigail Miyamoto M.D.   On: 02/19/2016 07:18   Dg Chest 2 View  Result Date: 01/31/2016 CLINICAL DATA:  Bilateral rib pain. History of hypertension, chronic pulmonary embolism and sickle cell anemia. EXAM: CHEST  2 VIEW COMPARISON:  Chest radiograph January 27, 2016 and CT chest January 05, 2016 FINDINGS: Cardiac silhouette is moderately enlarged and unchanged. Mediastinal silhouette is nonsuspicious. Pulmonary vascular congestion without pleural effusion or focal consolidation. RIGHT mid and lower lung zone atelectasis/scarring. No pneumothorax. Single lumen LEFT chest Port-A-Cath. Surgical clips in the included right abdomen compatible with cholecystectomy. Osseous structures are nonacute. Focal sclerosis in the humeral head compatible with infarcts. IMPRESSION:  Stable cardiomegaly and pulmonary vascular congestion. RIGHT atelectasis/scarring. Electronically Signed   By: Elon Alas M.D.   On: 01/31/2016 04:46   Ct Angio Chest Pe W Or Wo Contrast  Result Date: 02/21/2016 CLINICAL DATA:  Sickle cell disease.  Chest pain, bilateral rib pain EXAM: CT ANGIOGRAPHY CHEST WITH CONTRAST TECHNIQUE: Multidetector CT imaging of the chest was performed using the standard protocol during bolus administration of intravenous contrast. Multiplanar CT image reconstructions and MIPs were obtained to evaluate the vascular anatomy. CONTRAST:  100 mL Isovue 370 COMPARISON:  01/05/2016, 11/10/2015 FINDINGS: Cardiovascular: Satisfactory opacification of the central and lobar pulmonary arteries with somewhat suboptimal opacification of the subsegmental pulmonary arteries. Chronic bilateral lower lobe subsegmental occlusive pulmonary emboli. No  acute pulmonary embolus. Main pulmonary artery is enlarged measuring 4.2 cm in diameter. Right heart strain (RV/ LV equals 1.0). Severe cardiomegaly. Trace pericardial effusion. Thoracic aorta is normal in caliber. No thoracic aortic dissection or aneurysm. Mediastinum/Nodes: Mild mediastinal lymphadenopathy is unchanged compared with multiple prior examinations. Left-sided Port-A-Cath with the tip at the cavoatrial junction. Normal esophagus. Normal thyroid. Lungs/Pleura: No pleural effusion or pneumothorax. Bilateral patchy areas of ground-glass opacity in the upper and lower lobes both central and peripheral distribution similar in appearance to the prior exam. No focal consolidation, pleural effusion or pneumothorax. Upper Abdomen: Involuted and calcified spleen consistent with history of sickle cell disease. No acute upper abdominal abnormality. Musculoskeletal: No acute osseous abnormality. 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 Review of the MIP images confirms the above findings. IMPRESSION: 1. No acute pulmonary embolus. Chronic bilateral lower lobe subsegmental pulmonary emboli unchanged in the prior exam. Chronic right heart strain with dilatation of the main pulmonary artery as can be seen with pulmonary arterial hypertension. 2. Stable cardiomegaly. 3. Patchy ground-glass opacities bilaterally suggesting mild pulmonary edema versus an infectious or inflammatory etiology. 4. Stable mediastinal lymphadenopathy. Electronically Signed   By: Kathreen Devoid   On: 02/21/2016 10:46   US Renal  Result Date: 02/11/2016 CLINICAL DATA:  Renal failure.  Sickle cell disease EXAM: RENAL / URINARY TRACT ULTRASOUND COMPLETE COMPARISON:  CT abdomen and pelvis December 15, 2015 FINDINGS: Right Kidney: Length: 11.5 cm. Echogenicity and renal cortical thickness are within normal limits. No mass, perinephric fluid, or hydronephrosis visualized. No sonographically  demonstrable calculus or ureterectasis. Left Kidney: Length: 11.7 cm. Echogenicity and renal cortical thickness are within normal limits. No mass, perinephric fluid, or hydronephrosis visualized. No sonographically demonstrable calculus or ureterectasis. Bladder: Appears normal for degree of bladder distention. IMPRESSION: Study within normal limits. Electronically Signed   By: Lowella Grip III M.D.   On: 02/11/2016 15:20   Dg Chest Portable 1 View  Result Date: 02/11/2016 CLINICAL DATA:  Sickle cell anemia.  Hypoxia. EXAM: PORTABLE CHEST 1 VIEW COMPARISON:  01/31/2016. FINDINGS: PowerPort catheter with lead tip projected over right atrium. Cardiomegaly with normal pulmonary vascularity. No focal infiltrate. No pleural effusion or pneumothorax. IMPRESSION: 1. PowerPort catheter noted with lead tip projected over right atrium. 2. Cardiomegaly with pulmonary vascular prominence and bilateral interstitial prominence consistent with mild congestive heart failure. Electronically Signed   By: [place]   On: 02/11/2016 10:22    Scheduled Meds: . ambrisentan  5 mg Oral QHS  . aspirin  81 mg Oral Daily  . cholecalciferol  2,000 Units Oral Daily  . Deferasirox  1,080 mg Oral Daily  . folic acid  1 mg Oral Daily  . gabapentin  300 mg Oral TID  .  HYDROmorphone   Intravenous Q4H  . hydroxyurea  1,000 mg Oral Daily  . ketorolac  30 mg Intravenous Q6H  . morphine  30 mg Oral Q12H  . potassium chloride  40 mEq Oral Once  . rivaroxaban  20 mg Oral QHS  . senna-docusate  1 tablet Oral BID   Continuous Infusions: . dextrose 5 % and 0.45% NaCl 125 mL/hr at 02/23/16 1437    Principal Problem:   Sickle cell crisis (HCC) Active Problems:   Hb-SS disease with crisis ([place])     In excess of 25 minutes spent during this visit. Greater than 50% involved face to face contact with the patient for assessment, counseling and coordination of care.

## 2016-02-25 DIAGNOSIS — J810 Acute pulmonary edema: Secondary | ICD-10-CM

## 2016-02-25 LAB — BASIC METABOLIC PANEL
ANION GAP: 6 (ref 5–15)
BUN: 12 mg/dL (ref 6–20)
CO2: 26 mmol/L (ref 22–32)
Calcium: 8.6 mg/dL — ABNORMAL LOW (ref 8.9–10.3)
Chloride: 106 mmol/L (ref 101–111)
Creatinine, Ser: 0.89 mg/dL (ref 0.61–1.24)
GFR calc Af Amer: >60 mL/min (ref >60)
GFR calc non Af Amer: >60 mL/min (ref >60)
GLUCOSE: 117 mg/dL — AB (ref 65–99)
Potassium: 4.3 mmol/L (ref 3.5–5.1)
Sodium: 138 mmol/L (ref 135–145)

## 2016-02-25 LAB — MAGNESIUM: Magnesium: 1.8 mg/dL (ref 1.7–2.4)

## 2016-02-25 LAB — CBC WITH DIFFERENTIAL/PLATELET
BAND NEUTROPHILS: 0 %
BASOS ABS: 0 10*3/uL (ref 0.0–0.1)
BASOS PCT: 0 %
BLASTS: 0 %
EOS PCT: 5 %
Eosinophils Absolute: 0.9 10*3/uL — ABNORMAL HIGH (ref 0.0–0.7)
HEMATOCRIT: 23.3 % — AB (ref 39.0–52.0)
Hemoglobin: 8 g/dL — ABNORMAL LOW (ref 13.0–17.0)
LYMPHS ABS: 2.4 10*3/uL (ref 0.7–4.0)
LYMPHS PCT: 13 %
MCH: 30.4 pg (ref 26.0–34.0)
MCHC: 34.3 g/dL (ref 30.0–36.0)
MCV: 88.6 fL (ref 78.0–100.0)
MONO ABS: 1.8 10*3/uL — AB (ref 0.1–1.0)
Metamyelocytes Relative: 0 %
Monocytes Relative: 10 %
Myelocytes: 1 %
NEUTROS ABS: 13 10*3/uL — AB (ref 1.7–7.7)
Neutrophils Relative %: 71 %
Platelets: 484 10*3/uL — ABNORMAL HIGH (ref 150–400)
Promyelocytes Absolute: 0 %
RBC: 2.63 MIL/uL — ABNORMAL LOW (ref 4.22–5.81)
RDW: 20.2 % — AB (ref 11.5–15.5)
WBC: 18.1 10*3/uL — ABNORMAL HIGH (ref 4.0–10.5)
nRBC: 0 /100 WBC

## 2016-02-25 LAB — RETICULOCYTES
RBC.: 2.63 MIL/uL — AB (ref 4.22–5.81)
Retic Ct Pct: >23 % — ABNORMAL HIGH (ref 0.4–3.1)

## 2016-02-25 MED ORDER — FUROSEMIDE 10 MG/ML IJ SOLN
40.0000 mg | Freq: Once | INTRAMUSCULAR | Status: AC
  Administered 2016-02-25: 40 mg via INTRAVENOUS
  Filled 2016-02-25: qty 4

## 2016-02-25 MED ORDER — HYDROMORPHONE 1 MG/ML IV SOLN
INTRAVENOUS | Status: DC
  Administered 2016-02-25: 13.09 mg via INTRAVENOUS
  Administered 2016-02-25: 18:00:00 via INTRAVENOUS
  Administered 2016-02-25 (×4): 2 mg via INTRAVENOUS
  Administered 2016-02-26: 9 mg via INTRAVENOUS
  Administered 2016-02-26: 2 mg via INTRAVENOUS
  Administered 2016-02-26: 3.5 mg via INTRAVENOUS
  Administered 2016-02-26: 19:00:00 via INTRAVENOUS
  Administered 2016-02-26 (×2): 2 mg via INTRAVENOUS
  Administered 2016-02-26: 9.7 mg via INTRAVENOUS
  Administered 2016-02-26: 04:00:00 via INTRAVENOUS
  Administered 2016-02-26 (×2): 2 mg via INTRAVENOUS
  Administered 2016-02-26: 0.7 mg via INTRAVENOUS
  Administered 2016-02-26: 6.9 mg via INTRAVENOUS
  Administered 2016-02-26: 2 mg via INTRAVENOUS
  Administered 2016-02-26: 11.9 mg via INTRAVENOUS
  Administered 2016-02-26: 10.8 mg via INTRAVENOUS
  Administered 2016-02-27: 2 mg via INTRAVENOUS
  Administered 2016-02-27: 04:00:00 via INTRAVENOUS
  Administered 2016-02-27: 2 mg via INTRAVENOUS
  Administered 2016-02-27: 10.4 mg via INTRAVENOUS
  Administered 2016-02-27: 2 mg via INTRAVENOUS
  Administered 2016-02-27: 16:00:00 via INTRAVENOUS
  Administered 2016-02-27: 7.6 mg via INTRAVENOUS
  Administered 2016-02-27: 2 mg via INTRAVENOUS
  Administered 2016-02-27: 7.6 mg via INTRAVENOUS
  Administered 2016-02-27: 2 mg via INTRAVENOUS
  Administered 2016-02-27: 9.18 mg via INTRAVENOUS
  Administered 2016-02-27: 2 mg via INTRAVENOUS
  Filled 2016-02-25 (×5): qty 25

## 2016-02-25 NOTE — Progress Notes (Signed)
SICKLE CELL SERVICE PROGRESS NOTE  [name] [id number] DOB: 1980/03/01 DOA: 02/23/2016 PCP: [name], MD  Assessment/Plan: Principal Problem:   Sickle cell crisis ([place]) Active Problems:   Hb-SS disease with crisis ([place])  1. Hypoxia: Feels that he still has some pulmonary edema. Continue diuresis.  2. Hb SS with crisis: Pt has continued pain in the ribs and back. Will continue PCA at current dose,continue Toradol and decrease IVF.  3. Leukocytosis: Mild improvement. Likely related to crisis. No evidence of infection.  4. Anemia of Chronic Disease: Hb currently at baseline.  5. Chronic pain: Continue MS Contin.  6. Chronic Anticoagulation: Continue Xarelto  Code Status: Full Code Family Communication: N/A Disposition Plan: Not yet ready for discharge  [place].  Pager [phone]. If 7PM-7AM, please contact night-coverage.  02/25/2016, 6:09 PM  LOS: 1 day   Interim History: Pt still c/o pain in ribs and back at 8/10. He  Has still has not used his PCA maximally. Encouraged use of ICS. Last BM yesterday.  Consultants:  None  Procedures:  None  Antibiotics:  None    Objective: Vitals:   02/25/16 1248 02/25/16 1417 02/25/16 1618 02/25/16 1805  BP:    130/83  Pulse:  88  84  Resp: 17 19 16 16   Temp:  98.2 F (36.8 C)  98.3 F (36.8 C)  TempSrc:  Oral  Oral  SpO2: 94% 92% 94% 95%  Weight:      Height:       Weight change:   Intake/Output Summary (Last 24 hours) at 02/25/16 1809 Last data filed at 02/25/16 1656  Gross per 24 hour  Intake             1450 ml  Output             3450 ml  Net            -2000 ml    General: Alert, awake, oriented x3, in no acute distress.  HEENT: [place]/AT PEERL, EOMI, icteric at baseline. Neck: Trachea midline,  no masses, no thyromegal,y no JVD, no carotid bruit OROPHARYNX:  Moist, No exudate/ erythema/lesions.  Heart: Regular rate and rhythm, without murmurs, rubs, gallops, PMI non-displaced, no  heaves or thrills on palpation.  Lungs: Clear to auscultation, no wheezing or rhonchi noted. No increased vocal fremitus resonant to percussion  Abdomen: Soft, nontender, nondistended, positive bowel sounds, no masses no hepatosplenomegaly noted..  Neuro: No focal neurological deficits noted cranial nerves II through XII grossly intact.  Strength at functional baseline in bilateral upper and lower extremities. Musculoskeletal: No warmth swelling or erythema around joints, no spinal tenderness noted. Psychiatric: Patient alert and oriented x3, good insight and cognition, good recent to remote recall.    Data Reviewed: Basic Metabolic Panel:  Recent Labs Lab 02/21/16 0656 02/22/16 1001 02/23/16 1402 02/24/16 0617 02/25/16 1000  NA 137 139 138 137 138  K 3.1* 3.5 3.3* 3.4* 4.3  CL 107 110 111 107 106  CO2 21* 22 22 21* 26  GLUCOSE 105* 105* 98 124* 117*  BUN 7 8 9 8 12   CREATININE 0.62 0.76 0.73 0.92 0.89  CALCIUM 8.9 8.9 9.0 8.7* 8.6*  MG  --   --   --   --  1.8   Liver Function Tests:  Recent Labs Lab 02/19/16 0645 02/21/16 0656 02/23/16 1402  AST 25 31 30   ALT 19 20 24   ALKPHOS 70 74 78  BILITOT 3.1* 4.2* 4.2*  PROT 7.2  7.2 7.4  ALBUMIN 4.3 4.1 4.2   No results for input(s): LIPASE, AMYLASE in the last 168 hours. No results for input(s): AMMONIA in the last 168 hours. CBC:  Recent Labs Lab 02/19/16 0645 02/21/16 0656 02/23/16 1402 02/24/16 0617 02/25/16 1000  WBC 16.2* 17.8* 19.1* 20.4* 18.1*  NEUTROABS 12.9* 13.1* 13.7* 14.0* 13.0*  HGB 8.8* 7.7* 8.2* 7.7* 8.0*  HCT 26.3* 22.5* 24.0* 23.1* 23.3*  MCV 86.8 86.9 88.9 87.8 88.6  PLT 541* 560* 526* 528* 484*   Cardiac Enzymes: No results for input(s): CKTOTAL, CKMB, CKMBINDEX, TROPONINI in the last 168 hours. BNP (last 3 results)  Recent Labs  08/02/15 2307 11/28/15 0520 02/11/16 1006  BNP 503.2* 422.5* 381.0*    ProBNP (last 3 results) No results for input(s): PROBNP in the last 8760  hours.  CBG:  Recent Labs Lab 02/23/16 1439  GLUCAP 101*    No results found for this or any previous visit (from the past 240 hour(s)).   Studies: Dg Chest 2 View  Result Date: 02/23/2016 CLINICAL DATA:  SICKLE CELL CRISIS EXAM: CHEST  2 VIEW COMPARISON:  CT 02/21/2016 FINDINGS: LEFT-SIDED POWER PORT UNCHANGED. STABLE CARDIAC SILHOUETTE. THERE IS MILD CENTRAL VENOUS CONGESTION. NO FOCAL INFILTRATE. NO PLEURAL FLUID. NO PNEUMOTHORAX. IMPRESSION: 1. STABLE CARDIOMEGALY. 2. STABLE CENTRAL VENOUS PULMONARY CONGESTION. 3. NO EVIDENCE OF PULMONARY INFILTRATE Electronically Signed   By: Suzy Bouchard M.D.   On: 02/23/2016 16:35   Dg Chest 2 View  Result Date: 02/21/2016 CLINICAL DATA:  New onset chest pain beginning today. EXAM: CHEST  2 VIEW COMPARISON:  02/19/2016 FINDINGS: Chronic enlargement of the cardiac silhouette. Power port tip in the SVC just above the right atrium as seen previously. Pulmonary venous hypertension, possibly with early interstitial edema. No advanced edema. No consolidation or collapse. No effusions. IMPRESSION: Chronic cardiomegaly. Worsening of the pulmonary vascular pattern suggesting venous hypertension with early interstitial edema. Electronically Signed   By: [place] Chimes M.D.   On: 02/21/2016 07:49   Dg Chest 2 View  Result Date: 02/19/2016 CLINICAL DATA:  Sickle cell pain crisis, worsening bilat lower chest pain since yesterday EXAM: CHEST  2 VIEW COMPARISON:  02/11/2016 FINDINGS: Left Port-A-Cath is unchanged in position. Midline trachea. Moderate cardiomegaly. Mediastinal contours otherwise within normal limits. No pleural effusion or pneumothorax. No congestive failure. Lower lobe predominant pulmonary interstitial thickening is chronic and nonspecific. No lobar consolidation. IMPRESSION: Cardiomegaly with chronic interstitial thickening. No acute findings. Electronically Signed   By: Abigail Miyamoto M.D.   On: 02/19/2016 07:18   Dg Chest 2 View  Result Date:  01/31/2016 CLINICAL DATA:  Bilateral rib pain. History of hypertension, chronic pulmonary embolism and sickle cell anemia. EXAM: CHEST  2 VIEW COMPARISON:  Chest radiograph January 27, 2016 and CT chest January 05, 2016 FINDINGS: Cardiac silhouette is moderately enlarged and unchanged. Mediastinal silhouette is nonsuspicious. Pulmonary vascular congestion without pleural effusion or focal consolidation. RIGHT mid and lower lung zone atelectasis/scarring. No pneumothorax. Single lumen LEFT chest Port-A-Cath. Surgical clips in the included right abdomen compatible with cholecystectomy. Osseous structures are nonacute. Focal sclerosis in the humeral head compatible with infarcts. IMPRESSION: Stable cardiomegaly and pulmonary vascular congestion. RIGHT atelectasis/scarring. Electronically Signed   By: Elon Alas M.D.   On: 01/31/2016 04:46   Ct Angio Chest Pe W Or Wo Contrast  Result Date: 02/21/2016 CLINICAL DATA:  Sickle cell disease.  Chest pain, bilateral rib pain EXAM: CT ANGIOGRAPHY CHEST WITH CONTRAST TECHNIQUE: Multidetector CT imaging of the chest  was performed using the standard protocol during bolus administration of intravenous contrast. Multiplanar CT image reconstructions and MIPs were obtained to evaluate the vascular anatomy. CONTRAST:  100 mL Isovue 370 COMPARISON:  01/05/2016, 11/10/2015 FINDINGS: Cardiovascular: Satisfactory opacification of the central and lobar pulmonary arteries with somewhat suboptimal opacification of the subsegmental pulmonary arteries. Chronic bilateral lower lobe subsegmental occlusive pulmonary emboli. No acute pulmonary embolus. Main pulmonary artery is enlarged measuring 4.2 cm in diameter. Right heart strain (RV/ LV equals 1.0). Severe cardiomegaly. Trace pericardial effusion. Thoracic aorta is normal in caliber. No thoracic aortic dissection or aneurysm. Mediastinum/Nodes: Mild mediastinal lymphadenopathy is unchanged compared with multiple prior  examinations. Left-sided Port-A-Cath with the tip at the cavoatrial junction. Normal esophagus. Normal thyroid. Lungs/Pleura: No pleural effusion or pneumothorax. Bilateral patchy areas of ground-glass opacity in the upper and lower lobes both central and peripheral distribution similar in appearance to the prior exam. No focal consolidation, pleural effusion or pneumothorax. Upper Abdomen: Involuted and calcified spleen consistent with history of sickle cell disease. No acute upper abdominal abnormality. Musculoskeletal: No acute osseous abnormality. 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 Review of the MIP images confirms the above findings. IMPRESSION: 1. No acute pulmonary embolus. Chronic bilateral lower lobe subsegmental pulmonary emboli unchanged in the prior exam. Chronic right heart strain with dilatation of the main pulmonary artery as can be seen with pulmonary arterial hypertension. 2. Stable cardiomegaly. 3. Patchy ground-glass opacities bilaterally suggesting mild pulmonary edema versus an infectious or inflammatory etiology. 4. Stable mediastinal lymphadenopathy. Electronically Signed   By: Kathreen Devoid   On: 02/21/2016 10:46   US Renal  Result Date: 02/11/2016 CLINICAL DATA:  Renal failure.  Sickle cell disease EXAM: RENAL / URINARY TRACT ULTRASOUND COMPLETE COMPARISON:  CT abdomen and pelvis December 15, 2015 FINDINGS: Right Kidney: Length: 11.5 cm. Echogenicity and renal cortical thickness are within normal limits. No mass, perinephric fluid, or hydronephrosis visualized. No sonographically demonstrable calculus or ureterectasis. Left Kidney: Length: 11.7 cm. Echogenicity and renal cortical thickness are within normal limits. No mass, perinephric fluid, or hydronephrosis visualized. No sonographically demonstrable calculus or ureterectasis. Bladder: Appears normal for degree of bladder distention. IMPRESSION: Study within normal limits.  Electronically Signed   By: Lowella Grip III M.D.   On: 02/11/2016 15:20   Dg Chest Portable 1 View  Result Date: 02/11/2016 CLINICAL DATA:  Sickle cell anemia.  Hypoxia. EXAM: PORTABLE CHEST 1 VIEW COMPARISON:  01/31/2016. FINDINGS: PowerPort catheter with lead tip projected over right atrium. Cardiomegaly with normal pulmonary vascularity. No focal infiltrate. No pleural effusion or pneumothorax. IMPRESSION: 1. PowerPort catheter noted with lead tip projected over right atrium. 2. Cardiomegaly with pulmonary vascular prominence and bilateral interstitial prominence consistent with mild congestive heart failure. Electronically Signed   By: [place]   On: 02/11/2016 10:22    Scheduled Meds: . ambrisentan  5 mg Oral QHS  . aspirin  81 mg Oral Daily  . cholecalciferol  2,000 Units Oral Daily  . Deferasirox  1,080 mg Oral Daily  . folic acid  1 mg Oral Daily  . furosemide  40 mg Intravenous Once  . gabapentin  300 mg Oral TID  . HYDROmorphone   Intravenous Q4H  . hydroxyurea  1,000 mg Oral Daily  . ketorolac  30 mg Intravenous Q6H  . morphine  30 mg Oral Q12H  . potassium chloride  40 mEq Oral Once  . rivaroxaban  20 mg Oral QHS  . senna-docusate  1  tablet Oral BID   Continuous Infusions: . dextrose 5 % and 0.45% NaCl 10 mL/hr at 02/25/16 0115    Principal Problem:   Sickle cell crisis [facility]) Active Problems:   Hb-SS disease with crisis ([place])     In excess of 25 minutes spent during this visit. Greater than 50% involved face to face contact with the patient for assessment, counseling and coordination of care.

## 2016-02-26 MED ORDER — HYDROMORPHONE HCL 4 MG PO TABS
4.0000 mg | ORAL_TABLET | ORAL | Status: DC
  Administered 2016-02-26 – 2016-03-01 (×24): 4 mg via ORAL
  Filled 2016-02-26 (×25): qty 1

## 2016-02-26 MED ORDER — DEXTROSE-NACL 5-0.45 % IV SOLN
INTRAVENOUS | Status: DC
  Administered 2016-02-26 – 2016-02-27 (×2): via INTRAVENOUS

## 2016-02-26 MED ORDER — RIVAROXABAN 20 MG PO TABS
20.0000 mg | ORAL_TABLET | Freq: Every day | ORAL | Status: DC
  Administered 2016-02-26 – 2016-03-01 (×5): 20 mg via ORAL
  Filled 2016-02-26 (×5): qty 1

## 2016-02-26 MED ORDER — DEXTROSE-NACL 5-0.45 % IV SOLN: INTRAVENOUS | Status: DC

## 2016-02-27 DIAGNOSIS — I2781 Cor pulmonale (chronic): Secondary | ICD-10-CM

## 2016-02-27 LAB — CBC WITH DIFFERENTIAL/PLATELET
BASOS PCT: 0 %
Basophils Absolute: 0 10*3/uL (ref 0.0–0.1)
EOS PCT: 5 %
Eosinophils Absolute: 1 10*3/uL — ABNORMAL HIGH (ref 0.0–0.7)
HEMATOCRIT: 21.6 % — AB (ref 39.0–52.0)
HEMOGLOBIN: 7.6 g/dL — AB (ref 13.0–17.0)
LYMPHS ABS: 0.6 10*3/uL — AB (ref 0.7–4.0)
Lymphocytes Relative: 3 %
MCH: 31 pg (ref 26.0–34.0)
MCHC: 35.2 g/dL (ref 30.0–36.0)
MCV: 88.2 fL (ref 78.0–100.0)
Monocytes Absolute: 1.9 10*3/uL — ABNORMAL HIGH (ref 0.1–1.0)
Monocytes Relative: 10 %
NEUTROS ABS: 15.7 10*3/uL — AB (ref 1.7–7.7)
Neutrophils Relative %: 82 %
Platelets: 449 10*3/uL — ABNORMAL HIGH (ref 150–400)
RBC: 2.45 MIL/uL — ABNORMAL LOW (ref 4.22–5.81)
RDW: 19.6 % — ABNORMAL HIGH (ref 11.5–15.5)
WBC: 19.2 10*3/uL — AB (ref 4.0–10.5)
nRBC: 4 /100 WBC — ABNORMAL HIGH

## 2016-02-27 LAB — BASIC METABOLIC PANEL
Anion gap: 7 (ref 5–15)
BUN: 18 mg/dL (ref 6–20)
CHLORIDE: 108 mmol/L (ref 101–111)
CO2: 24 mmol/L (ref 22–32)
CREATININE: 0.7 mg/dL (ref 0.61–1.24)
Calcium: 8.8 mg/dL — ABNORMAL LOW (ref 8.9–10.3)
GFR calc Af Amer: >60 mL/min (ref >60)
GFR calc non Af Amer: >60 mL/min (ref >60)
Glucose, Bld: 113 mg/dL — ABNORMAL HIGH (ref 65–99)
Potassium: 4.6 mmol/L (ref 3.5–5.1)
Sodium: 139 mmol/L (ref 135–145)

## 2016-02-27 LAB — RETICULOCYTES
RBC.: 2.45 MIL/uL — AB (ref 4.22–5.81)
Retic Count, Absolute: 389.6 10*3/uL — ABNORMAL HIGH (ref 19.0–186.0)
Retic Ct Pct: 15.9 % — ABNORMAL HIGH (ref 0.4–3.1)

## 2016-02-27 MED ORDER — DEXTROSE-NACL 5-0.45 % IV SOLN
INTRAVENOUS | Status: DC
  Administered 2016-02-27 – 2016-02-28 (×2): via INTRAVENOUS

## 2016-02-27 MED ORDER — HYDROMORPHONE HCL 2 MG/ML IJ SOLN
2.0000 mg | INTRAMUSCULAR | Status: DC
  Administered 2016-02-27 – 2016-02-29 (×10): 2 mg via INTRAVENOUS
  Filled 2016-02-27 (×10): qty 1

## 2016-02-27 MED ORDER — FUROSEMIDE 10 MG/ML IJ SOLN
40.0000 mg | Freq: Once | INTRAMUSCULAR | Status: AC
  Administered 2016-02-27: 40 mg via INTRAVENOUS
  Filled 2016-02-27: qty 4

## 2016-02-27 MED ORDER — FUROSEMIDE 10 MG/ML IJ SOLN
40.0000 mg | Freq: Every day | INTRAMUSCULAR | Status: DC
  Administered 2016-02-28 – 2016-02-29 (×2): 40 mg via INTRAVENOUS
  Filled 2016-02-27 (×2): qty 4

## 2016-02-27 MED ORDER — HYDROMORPHONE 1 MG/ML IV SOLN
INTRAVENOUS | Status: DC
  Administered 2016-02-27: 10.24 mg via INTRAVENOUS
  Administered 2016-02-28: 6.3 mg via INTRAVENOUS
  Administered 2016-02-28: 3.5 mg via INTRAVENOUS
  Administered 2016-02-28: 7.7 mg via INTRAVENOUS
  Administered 2016-02-28: 4.9 mg via INTRAVENOUS
  Administered 2016-02-28: 12.6 mg via INTRAVENOUS
  Administered 2016-02-28: 9.8 mg via INTRAVENOUS
  Administered 2016-02-28 (×2): 25 mg via INTRAVENOUS
  Administered 2016-02-29 (×2): 7 mg via INTRAVENOUS
  Filled 2016-02-27 (×3): qty 25

## 2016-02-27 MED ORDER — HYDROMORPHONE HCL 2 MG/ML IJ SOLN: 2.0000 mg | INTRAMUSCULAR | Status: DC

## 2016-02-27 NOTE — Progress Notes (Signed)
SICKLE CELL SERVICE PROGRESS NOTE  [name] [id number] DOB: 1980/08/17 DOA: 02/23/2016 PCP: [name], MD  Assessment/Plan: Principal Problem:   Sickle cell crisis ([place]) Active Problems:   Hb-SS disease with crisis ([place])  1. Hypoxia due to Acute Pulmonary Edema: Continue diuretics and assess day to day.  2. Hb SS with crisis: Pt has continued pain in the ribs and back. Will continue PCA at current dose,continue Toradol and schedule clinician assisted doses of Dilaudid.   3. Leukocytosis: Likely related to crisis. No evidence of infection.  4. Anemia of Chronic Disease: Hb currently at baseline.  5. Chronic pain: Continue MS Contin.  6. Chronic Anticoagulation: Continue Xarelto  Code Status: Full Code Family Communication: N/A Disposition Plan: Not yet ready for discharge  [name].  Pager [phone]. If 7PM-7AM, please contact night-coverage.  02/27/2016, 6:12 PM  LOS: 3 days   Interim History: Pt still c/o pain in ribs and back at 9/10 with change in weather that occurred overnight.  He  Has used his PCA maximally and is still having significant pain. Encouraged use of ICS and ambulation.   Consultants:  None  Procedures:  None  Antibiotics:  None    Objective: General: Alert, awake, oriented x3, in moderate distress.  HEENT: [place]/AT PEERL, EOMI, icteric at baseline. Neck: Trachea midline,  no masses, no thyromegal,y no JVD, no carotid bruit OROPHARYNX:  Moist, No exudate/ erythema/lesions.  Heart: Regular rate and rhythm, without murmurs, rubs, gallops, PMI non-displaced, no heaves or thrills on palpation.  Lungs: Clear to auscultation, no wheezing or rhonchi noted. No increased vocal fremitus resonant to percussion  Abdomen: Soft, nontender, nondistended, positive bowel sounds, no masses no hepatosplenomegaly noted.  Neuro: No focal neurological deficits noted cranial nerves II through XII grossly intact.  Strength at functional  baseline in bilateral upper and lower extremities. Musculoskeletal: No warmth swelling or erythema around joints, no spinal tenderness noted. Psychiatric: Patient alert and oriented x3, good insight and cognition, good recent to remote recall.    Data Reviewed: Basic Metabolic Panel:  Recent Labs Lab 02/22/16 1001 02/23/16 1402 02/24/16 0617 02/25/16 1000  NA 139 138 137 138  K 3.5 3.3* 3.4* 4.3  CL 110 111 107 106  CO2 22 22 21* 26  GLUCOSE 105* 98 124* 117*  BUN 8 9 8 12   CREATININE 0.76 0.73 0.92 0.89  CALCIUM 8.9 9.0 8.7* 8.6*  MG  --   --   --  1.8   Liver Function Tests:  Recent Labs Lab 02/21/16 0656 02/23/16 1402  AST 31 30  ALT 20 24  ALKPHOS 74 78  BILITOT 4.2* 4.2*  PROT 7.2 7.4  ALBUMIN 4.1 4.2   No results for input(s): LIPASE, AMYLASE in the last 168 hours. No results for input(s): AMMONIA in the last 168 hours. CBC:  Recent Labs Lab 02/21/16 0656 02/23/16 1402 02/24/16 0617 02/25/16 1000  WBC 17.8* 19.1* 20.4* 18.1*  NEUTROABS 13.1* 13.7* 14.0* 13.0*  HGB 7.7* 8.2* 7.7* 8.0*  HCT 22.5* 24.0* 23.1* 23.3*  MCV 86.9 88.9 87.8 88.6  PLT 560* 526* 528* 484*   Cardiac Enzymes: No results for input(s): CKTOTAL, CKMB, CKMBINDEX, TROPONINI in the last 168 hours. BNP (last 3 results)  Recent Labs  08/02/15 2307 11/28/15 0520 02/11/16 1006  BNP 503.2* 422.5* 381.0*    ProBNP (last 3 results) No results for input(s): PROBNP in the last 8760 hours.  CBG:  Recent Labs Lab 02/23/16 1439  GLUCAP 101*  No results found for this or any previous visit (from the past 240 hour(s)).   Studies: Dg Chest 2 View  Result Date: 02/23/2016 CLINICAL DATA:  SICKLE CELL CRISIS EXAM: CHEST  2 VIEW COMPARISON:  CT 02/21/2016 FINDINGS: LEFT-SIDED POWER PORT UNCHANGED. STABLE CARDIAC SILHOUETTE. THERE IS MILD CENTRAL VENOUS CONGESTION. NO FOCAL INFILTRATE. NO PLEURAL FLUID. NO PNEUMOTHORAX. IMPRESSION: 1. STABLE CARDIOMEGALY. 2. STABLE CENTRAL VENOUS  PULMONARY CONGESTION. 3. NO EVIDENCE OF PULMONARY INFILTRATE Electronically Signed   By: Suzy Bouchard M.D.   On: 02/23/2016 16:35   Dg Chest 2 View  Result Date: 02/21/2016 CLINICAL DATA:  New onset chest pain beginning today. EXAM: CHEST  2 VIEW COMPARISON:  02/19/2016 FINDINGS: Chronic enlargement of the cardiac silhouette. Power port tip in the SVC just above the right atrium as seen previously. Pulmonary venous hypertension, possibly with early interstitial edema. No advanced edema. No consolidation or collapse. No effusions. IMPRESSION: Chronic cardiomegaly. Worsening of the pulmonary vascular pattern suggesting venous hypertension with early interstitial edema. Electronically Signed   By: Nelson Chimes M.D.   On: 02/21/2016 07:49   Dg Chest 2 View  Result Date: 02/19/2016 CLINICAL DATA:  Sickle cell pain crisis, worsening bilat lower chest pain since yesterday EXAM: CHEST  2 VIEW COMPARISON:  02/11/2016 FINDINGS: Left Port-A-Cath is unchanged in position. Midline trachea. Moderate cardiomegaly. Mediastinal contours otherwise within normal limits. No pleural effusion or pneumothorax. No congestive failure. Lower lobe predominant pulmonary interstitial thickening is chronic and nonspecific. No lobar consolidation. IMPRESSION: Cardiomegaly with chronic interstitial thickening. No acute findings. Electronically Signed   By: Abigail Miyamoto M.D.   On: 02/19/2016 07:18   Dg Chest 2 View  Result Date: 01/31/2016 CLINICAL DATA:  Bilateral rib pain. History of hypertension, chronic pulmonary embolism and sickle cell anemia. EXAM: CHEST  2 VIEW COMPARISON:  Chest radiograph January 27, 2016 and CT chest January 05, 2016 FINDINGS: Cardiac silhouette is moderately enlarged and unchanged. Mediastinal silhouette is nonsuspicious. Pulmonary vascular congestion without pleural effusion or focal consolidation. RIGHT mid and lower lung zone atelectasis/scarring. No pneumothorax. Single lumen LEFT chest  Port-A-Cath. Surgical clips in the included right abdomen compatible with cholecystectomy. Osseous structures are nonacute. Focal sclerosis in the humeral head compatible with infarcts. IMPRESSION: Stable cardiomegaly and pulmonary vascular congestion. RIGHT atelectasis/scarring. Electronically Signed   By: Elon Alas M.D.   On: 01/31/2016 04:46   Ct Angio Chest Pe W Or Wo Contrast  Result Date: 02/21/2016 CLINICAL DATA:  Sickle cell disease.  Chest pain, bilateral rib pain EXAM: CT ANGIOGRAPHY CHEST WITH CONTRAST TECHNIQUE: Multidetector CT imaging of the chest was performed using the standard protocol during bolus administration of intravenous contrast. Multiplanar CT image reconstructions and MIPs were obtained to evaluate the vascular anatomy. CONTRAST:  100 mL Isovue 370 COMPARISON:  01/05/2016, 11/10/2015 FINDINGS: Cardiovascular: Satisfactory opacification of the central and lobar pulmonary arteries with somewhat suboptimal opacification of the subsegmental pulmonary arteries. Chronic bilateral lower lobe subsegmental occlusive pulmonary emboli. No acute pulmonary embolus. Main pulmonary artery is enlarged measuring 4.2 cm in diameter. Right heart strain (RV/ LV equals 1.0). Severe cardiomegaly. Trace pericardial effusion. Thoracic aorta is normal in caliber. No thoracic aortic dissection or aneurysm. Mediastinum/Nodes: Mild mediastinal lymphadenopathy is unchanged compared with multiple prior examinations. Left-sided Port-A-Cath with the tip at the cavoatrial junction. Normal esophagus. Normal thyroid. Lungs/Pleura: No pleural effusion or pneumothorax. Bilateral patchy areas of ground-glass opacity in the upper and lower lobes both central and peripheral distribution similar in appearance to the prior  exam. No focal consolidation, pleural effusion or pneumothorax. Upper Abdomen: Involuted and calcified spleen consistent with history of sickle cell disease. No acute upper abdominal abnormality.  Musculoskeletal: No acute osseous abnormality. 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 Review of the MIP images confirms the above findings. IMPRESSION: 1. No acute pulmonary embolus. Chronic bilateral lower lobe subsegmental pulmonary emboli unchanged in the prior exam. Chronic right heart strain with dilatation of the main pulmonary artery as can be seen with pulmonary arterial hypertension. 2. Stable cardiomegaly. 3. Patchy ground-glass opacities bilaterally suggesting mild pulmonary edema versus an infectious or inflammatory etiology. 4. Stable mediastinal lymphadenopathy. Electronically Signed   By: Kathreen Devoid   On: 02/21/2016 10:46   US Renal  Result Date: 02/11/2016 CLINICAL DATA:  Renal failure.  Sickle cell disease EXAM: RENAL / URINARY TRACT ULTRASOUND COMPLETE COMPARISON:  CT abdomen and pelvis December 15, 2015 FINDINGS: Right Kidney: Length: 11.5 cm. Echogenicity and renal cortical thickness are within normal limits. No mass, perinephric fluid, or hydronephrosis visualized. No sonographically demonstrable calculus or ureterectasis. Left Kidney: Length: 11.7 cm. Echogenicity and renal cortical thickness are within normal limits. No mass, perinephric fluid, or hydronephrosis visualized. No sonographically demonstrable calculus or ureterectasis. Bladder: Appears normal for degree of bladder distention. IMPRESSION: Study within normal limits. Electronically Signed   By: [place]ella Grip III M.D.   On: 02/11/2016 15:20   Dg Chest Portable 1 View  Result Date: 02/11/2016 CLINICAL DATA:  Sickle cell anemia.  Hypoxia. EXAM: PORTABLE CHEST 1 VIEW COMPARISON:  01/31/2016. FINDINGS: PowerPort catheter with lead tip projected over right atrium. Cardiomegaly with normal pulmonary vascularity. No focal infiltrate. No pleural effusion or pneumothorax. IMPRESSION: 1. PowerPort catheter noted with lead tip projected over right atrium. 2. Cardiomegaly with  pulmonary vascular prominence and bilateral interstitial prominence consistent with mild congestive heart failure. Electronically Signed   By: [place]   On: 02/11/2016 10:22    Scheduled Meds: . ambrisentan  5 mg Oral QHS  . aspirin  81 mg Oral Daily  . cholecalciferol  2,000 Units Oral Daily  . Deferasirox  1,080 mg Oral Daily  . folic acid  1 mg Oral Daily  . gabapentin  300 mg Oral TID  . HYDROmorphone   Intravenous Q4H  .  HYDROmorphone (DILAUDID) injection  2 mg Intravenous Q4H  . HYDROmorphone  4 mg Oral Q4H  . hydroxyurea  1,000 mg Oral Daily  . ketorolac  30 mg Intravenous Q6H  . morphine  30 mg Oral Q12H  . rivaroxaban  20 mg Oral Q supper  . senna-docusate  1 tablet Oral BID   Continuous Infusions: . dextrose 5 % and 0.45% NaCl      Principal Problem:   Sickle cell crisis ([facility]) Active Problems:   Hb-SS disease with crisis ([place])     In excess of 25 minutes spent during this visit. Greater than 50% involved face to face contact with the patient for assessment, counseling and coordination of care.

## 2016-02-27 NOTE — Progress Notes (Signed)
Pt walked halls x2 laps on room air sats 84%. Recovered to 91% 4LNC once back in room.

## 2016-02-27 NOTE — Progress Notes (Signed)
Encouraged pt to walk in halls, pt declined stating "I will after I eat lunch."

## 2016-02-27 NOTE — Progress Notes (Signed)
[name] PROGRESS NOTE  [name] [id number] DOB: [birth year] DOA: 02/23/2016 PCP: [name], MD  Assessment/Plan: Principal Problem:   Gerald cell crisis ([name]) Active Problems:   Hb-SS disease with crisis ([name])  1. Hypoxia:Will keep fluids at [name] and diurese patient. Continue supplemental oxygen. 2. Hb SS with crisis:Will continue PCA at current dose,continue Toradol and decrease IVF. Will also alternate scheduled oral Dilaudid and IV Dilaudid every 2 hours.  3. Leukocytosis: Increased since yesterday. No evidence of infection. Will continue to monitor. 4. Anemia of Chronic Disease: Hb currently at baseline.  5. Chronic pain: Continue MS Contin.  6. Chronic Anticoagulation: Continue Xarelto  Code Status: Full Code Family Communication: N/A Disposition Plan: Not yet ready for discharge  [name].  Pager [phone]. If 7PM-7AM, please contact night-coverage.  02/27/2016, 6:13 PM  LOS: 3 days   Interim History: Pt still c/o pain in ribs and back at 7/10. He  Has not used his PCA maximally and admits that he has not been using the ICS. Encouraged use of ICS.   Consultants:  None  Procedures:  None  Antibiotics:  None    Objective: Vitals:   02/27/16 1143 02/27/16 1454 02/27/16 1518 02/27/16 1521  BP:  (!) 142/83    Pulse:  92    Resp: 19 17 14    Temp:  98.4 F (36.9 C)    TempSrc:  Oral    SpO2: 91% 95% 91% 94%  Weight:      Height:       Weight change: 5.897 kg (13 lb)  Intake/Output Summary (Last 24 hours) at 02/27/16 1813 Last data filed at 02/27/16 1613  Gross per 24 hour  Intake          2961.67 ml  Output             1865 ml  Net          1096.67 ml    General: Alert, awake, oriented x3, in no acute distress.  HEENT: [name]/AT PEERL, EOMI, icteric at baseline. Neck: Trachea midline,  no masses, no thyromegal,y no JVD, no carotid bruit OROPHARYNX:  Moist, No exudate/ erythema/lesions.  Heart: Regular rate and  rhythm, without murmurs, rubs, gallops, PMI non-displaced, no heaves or thrills on palpation.  Lungs: Clear to auscultation, no wheezing or rhonchi noted. No increased vocal fremitus resonant to percussion  Abdomen: Soft, nontender, nondistended, positive bowel sounds, no masses no hepatosplenomegaly noted..  Neuro: No focal neurological deficits noted cranial nerves II through XII grossly intact.  Strength at functional baseline in bilateral upper and lower extremities. Musculoskeletal: No warmth swelling or erythema around joints, no spinal tenderness noted. Psychiatric: Patient alert and oriented x3, good insight and cognition, good recent to remote recall.    Data Reviewed: Basic Metabolic Panel:  Recent Labs Lab 02/22/16 1001 02/23/16 1402 02/24/16 0617 02/25/16 1000 02/27/16 0547  NA 139 138 137 138 139  K 3.5 3.3* 3.4* 4.3 4.6  CL 110 111 107 106 108  CO2 22 22 21* 26 24  GLUCOSE 105* 98 124* 117* 113*  BUN 8 9 8 12 18   CREATININE 0.76 0.73 0.92 0.89 0.70  CALCIUM 8.9 9.0 8.7* 8.6* 8.8*  MG  --   --   --  1.8  --    Liver Function Tests:  Recent Labs Lab 02/21/16 0656 02/23/16 1402  AST 31 30  ALT 20 24  ALKPHOS 74 78  BILITOT 4.2* 4.2*  PROT 7.2 7.4  ALBUMIN  4.1 4.2   No results for input(s): LIPASE, AMYLASE in the last 168 hours. No results for input(s): AMMONIA in the last 168 hours. CBC:  Recent Labs Lab 02/21/16 0656 02/23/16 1402 02/24/16 0617 02/25/16 1000 02/27/16 0547  WBC 17.8* 19.1* 20.4* 18.1* 19.2*  NEUTROABS 13.1* 13.7* 14.0* 13.0* 15.7*  HGB 7.7* 8.2* 7.7* 8.0* 7.6*  HCT 22.5* 24.0* 23.1* 23.3* 21.6*  MCV 86.9 88.9 87.8 88.6 88.2  PLT 560* 526* 528* 484* 449*   Cardiac Enzymes: No results for input(s): CKTOTAL, CKMB, CKMBINDEX, TROPONINI in the last 168 hours. BNP (last 3 results)  Recent Labs  08/02/15 2307 11/28/15 0520 02/11/16 1006  BNP 503.2* 422.5* 381.0*    ProBNP (last 3 results) No results for input(s): PROBNP in  the last 8760 hours.  CBG:  Recent Labs Lab 02/23/16 1439  GLUCAP 101*    No results found for this or any previous visit (from the past 240 hour(s)).   Studies: Dg Chest 2 View  Result Date: 02/23/2016 CLINICAL DATA:  Gerald CELL CRISIS EXAM: CHEST  2 VIEW COMPARISON:  CT 02/21/2016 FINDINGS: LEFT-SIDED POWER Gerald UNCHANGED. STABLE CARDIAC SILHOUETTE. THERE IS MILD CENTRAL VENOUS CONGESTION. NO FOCAL INFILTRATE. NO PLEURAL FLUID. NO PNEUMOTHORAX. IMPRESSION: 1. STABLE CARDIOMEGALY. 2. STABLE CENTRAL VENOUS PULMONARY CONGESTION. 3. NO EVIDENCE OF PULMONARY INFILTRATE Electronically Signed   By: Suzy Bouchard M.D.   On: 02/23/2016 16:35   Dg Chest 2 View  Result Date: 02/21/2016 CLINICAL DATA:  New onset chest pain beginning today. EXAM: CHEST  2 VIEW COMPARISON:  02/19/2016 FINDINGS: Chronic enlargement of the cardiac silhouette. Power Gerald tip in the SVC just above the right atrium as seen previously. Pulmonary venous hypertension, possibly with early interstitial edema. No advanced edema. No consolidation or collapse. No effusions. IMPRESSION: Chronic cardiomegaly. Worsening of the pulmonary vascular pattern suggesting venous hypertension with early interstitial edema. Electronically Signed   By: Nelson Chimes M.D.   On: 02/21/2016 07:49   Dg Chest 2 View  Result Date: 02/19/2016 CLINICAL DATA:  Gerald cell pain crisis, worsening bilat lower chest pain since yesterday EXAM: CHEST  2 VIEW COMPARISON:  02/11/2016 FINDINGS: Left Gerald-A-Cath is unchanged in position. Midline trachea. Moderate cardiomegaly. Mediastinal contours otherwise within normal limits. No pleural effusion or pneumothorax. No congestive failure. Lower lobe predominant pulmonary interstitial thickening is chronic and nonspecific. No lobar consolidation. IMPRESSION: Cardiomegaly with chronic interstitial thickening. No acute findings. Electronically Signed   By: Abigail Miyamoto M.D.   On: 02/19/2016 07:18   Dg Chest 2  View  Result Date: 01/31/2016 CLINICAL DATA:  Bilateral rib pain. History of hypertension, chronic pulmonary embolism and Gerald cell anemia. EXAM: CHEST  2 VIEW COMPARISON:  Chest radiograph January 27, 2016 and CT chest January 05, 2016 FINDINGS: Cardiac silhouette is moderately enlarged and unchanged. Mediastinal silhouette is nonsuspicious. Pulmonary vascular congestion without pleural effusion or focal consolidation. RIGHT mid and lower lung zone atelectasis/scarring. No pneumothorax. Single lumen LEFT chest Gerald-A-Cath. Surgical clips in the included right abdomen compatible with cholecystectomy. Osseous structures are nonacute. Focal sclerosis in the humeral head compatible with infarcts. IMPRESSION: Stable cardiomegaly and pulmonary vascular congestion. RIGHT atelectasis/scarring. Electronically Signed   By: Elon Alas M.D.   On: 01/31/2016 04:46   Ct Angio Chest Pe W Or Wo Contrast  Result Date: 02/21/2016 CLINICAL DATA:  Gerald cell disease.  Chest pain, bilateral rib pain EXAM: CT ANGIOGRAPHY CHEST WITH CONTRAST TECHNIQUE: Multidetector CT imaging of the chest was performed using the standard  protocol during bolus administration of intravenous contrast. Multiplanar CT image reconstructions and MIPs were obtained to evaluate the vascular anatomy. CONTRAST:  100 mL Isovue 370 COMPARISON:  01/05/2016, 11/10/2015 FINDINGS: Cardiovascular: Satisfactory opacification of the central and lobar pulmonary arteries with somewhat suboptimal opacification of the subsegmental pulmonary arteries. Chronic bilateral lower lobe subsegmental occlusive pulmonary emboli. No acute pulmonary embolus. Main pulmonary artery is enlarged measuring 4.2 cm in diameter. Right heart strain (RV/ LV equals 1.0). Severe cardiomegaly. Trace pericardial effusion. Thoracic aorta is normal in caliber. No thoracic aortic dissection or aneurysm. Mediastinum/Nodes: Mild mediastinal lymphadenopathy is unchanged compared with  multiple prior examinations. Left-sided Gerald-A-Cath with the tip at the cavoatrial junction. Normal esophagus. Normal thyroid. Lungs/Pleura: No pleural effusion or pneumothorax. Bilateral patchy areas of ground-glass opacity in the upper and lower lobes both central and peripheral distribution similar in appearance to the prior exam. No focal consolidation, pleural effusion or pneumothorax. Upper Abdomen: Involuted and calcified spleen consistent with history of Gerald cell disease. No acute upper abdominal abnormality. Musculoskeletal: No acute osseous abnormality. 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 Review of the MIP images confirms the above findings. IMPRESSION: 1. No acute pulmonary embolus. Chronic bilateral lower lobe subsegmental pulmonary emboli unchanged in the prior exam. Chronic right heart strain with dilatation of the main pulmonary artery as can be seen with pulmonary arterial hypertension. 2. Stable cardiomegaly. 3. Patchy ground-glass opacities bilaterally suggesting mild pulmonary edema versus an infectious or inflammatory etiology. 4. Stable mediastinal lymphadenopathy. Electronically Signed   By: Kathreen Devoid   On: 02/21/2016 10:46   US Renal  Result Date: 02/11/2016 CLINICAL DATA:  Renal failure.  Gerald cell disease EXAM: RENAL / URINARY TRACT ULTRASOUND COMPLETE COMPARISON:  CT abdomen and pelvis December 15, 2015 FINDINGS: Right Kidney: Length: 11.5 cm. Echogenicity and renal cortical thickness are within normal limits. No mass, perinephric fluid, or hydronephrosis visualized. No sonographically demonstrable calculus or ureterectasis. Left Kidney: Length: 11.7 cm. Echogenicity and renal cortical thickness are within normal limits. No mass, perinephric fluid, or hydronephrosis visualized. No sonographically demonstrable calculus or ureterectasis. Bladder: Appears normal for degree of bladder distention. IMPRESSION: Study within normal  limits. Electronically Signed   By: Lowella Grip III M.D.   On: 02/11/2016 15:20   Dg Chest Portable 1 View  Result Date: 02/11/2016 CLINICAL DATA:  Gerald cell anemia.  Hypoxia. EXAM: PORTABLE CHEST 1 VIEW COMPARISON:  01/31/2016. FINDINGS: PowerPort catheter with lead tip projected over right atrium. Cardiomegaly with normal pulmonary vascularity. No focal infiltrate. No pleural effusion or pneumothorax. IMPRESSION: 1. PowerPort catheter noted with lead tip projected over right atrium. 2. Cardiomegaly with pulmonary vascular prominence and bilateral interstitial prominence consistent with mild congestive heart failure. Electronically Signed   By: [place]   On: 02/11/2016 10:22    Scheduled Meds: . ambrisentan  5 mg Oral QHS  . aspirin  81 mg Oral Daily  . cholecalciferol  2,000 Units Oral Daily  . Deferasirox  1,080 mg Oral Daily  . folic acid  1 mg Oral Daily  . gabapentin  300 mg Oral TID  . HYDROmorphone   Intravenous Q4H  .  HYDROmorphone (DILAUDID) injection  2 mg Intravenous Q4H  . HYDROmorphone  4 mg Oral Q4H  . hydroxyurea  1,000 mg Oral Daily  . ketorolac  30 mg Intravenous Q6H  . morphine  30 mg Oral Q12H  . rivaroxaban  20 mg Oral Q supper  . senna-docusate  1 tablet Oral  BID   Continuous Infusions: . dextrose 5 % and 0.45% NaCl 10 mL/hr at 02/27/16 1812    Principal Problem:   Gerald cell crisis ([place]) Active Problems:   Hb-SS disease with crisis ([place])     In excess of 25 minutes spent during this visit. Greater than 50% involved face to face contact with the patient for assessment, counseling and coordination of care.

## 2016-02-28 DIAGNOSIS — D72829 Elevated white blood cell count, unspecified: Secondary | ICD-10-CM

## 2016-02-28 DIAGNOSIS — D638 Anemia in other chronic diseases classified elsewhere: Secondary | ICD-10-CM

## 2016-02-28 NOTE — Progress Notes (Signed)
SICKLE CELL SERVICE PROGRESS NOTE  [name] [id number] DOB: [birth year] DOA: 02/23/2016 PCP: [name], MD  Assessment/Plan: Principal Problem:   Sickle cell crisis ([place]) Active Problems:   Hb-SS disease with crisis ([place])  1. Hypoxia:Ground glass opacities on CT may represent pulmonary edema. Will keep fluids at [facility] and diurese patient. Continue supplemental oxygen.  2. Hb SS with crisis:Will continue PCA at current dose,continue Toradol and alternating scheduled oral Dilaudid and IV Dilaudid every 2 hours.  3. Leukocytosis:  No evidence of infection. Will continue to monitor. 4. Anemia of Chronic Disease: Hb currently at baseline.  5. Chronic pain: Continue MS Contin.  6. Chronic Anticoagulation: Continue Xarelto  Code Status: Full Code Family Communication: N/A Disposition Plan: Not yet ready for discharge  [place].  Pager [phone]. If 7PM-7AM, please contact night-coverage.  02/28/2016, 1:49 PM  LOS: 4 days   Interim History: Pt reports pain improved today and down tot 6/10. He  Has used 43.84 mg of Dilaudid on the PCA with 64/60:demands/deliveries in addition to 10 mg in clinician assisted doses of Dilaudid . Encouraged use of ICS. Last BM yesterday.  Consultants:  None  Procedures:  None  Antibiotics:  None    Objective: Vitals:   02/28/16 0520 02/28/16 0800 02/28/16 1005 02/28/16 1200  BP: (!) 100/57  122/81   Pulse: 88  86   Resp: 15 14 13 14   Temp: 97.5 F (36.4 C)  98 F (36.7 C)   TempSrc: Oral  Oral   SpO2: 92% 92% 95% 95%  Weight: 83.4 kg (183 lb 14.4 oz)     Height:       Weight change: 1.769 kg (3 lb 14.4 oz)  Intake/Output Summary (Last 24 hours) at 02/28/16 1349 Last data filed at 02/28/16 1237  Gross per 24 hour  Intake          2701.67 ml  Output             5425 ml  Net         -2723.33 ml    General: Alert, awake, oriented x3, in no acute distress. Appears in less pain today.  HEENT: [place]/AT PEERL,  EOMI, icteric at baseline.  Heart: Regular rate and rhythm, without murmurs, rubs, gallops, PMI non-displaced, no heaves or thrills on palpation.  Lungs: Clear to auscultation, no wheezing or rhonchi noted. No increased vocal fremitus resonant to percussion  Abdomen: Soft, nontender, nondistended, positive bowel sounds, no masses no hepatosplenomegaly noted..  Neuro: No focal neurological deficits noted cranial nerves II through XII grossly intact.  Strength at functional baseline in bilateral upper and lower extremities. Musculoskeletal: No warmth swelling or erythema around joints, no spinal tenderness noted. Psychiatric: Patient alert and oriented x3, good insight and cognition, good recent to remote recall.    Data Reviewed: Basic Metabolic Panel:  Recent Labs Lab 02/22/16 1001 02/23/16 1402 02/24/16 0617 02/25/16 1000 02/27/16 0547  NA 139 138 137 138 139  K 3.5 3.3* 3.4* 4.3 4.6  CL 110 111 107 106 108  CO2 22 22 21* 26 24  GLUCOSE 105* 98 124* 117* 113*  BUN 8 9 8 12 18   CREATININE 0.76 0.73 0.92 0.89 0.70  CALCIUM 8.9 9.0 8.7* 8.6* 8.8*  MG  --   --   --  1.8  --    Liver Function Tests:  Recent Labs Lab 02/23/16 1402  AST 30  ALT 24  ALKPHOS 78  BILITOT 4.2*  PROT 7.4  ALBUMIN  4.2   No results for input(s): LIPASE, AMYLASE in the last 168 hours. No results for input(s): AMMONIA in the last 168 hours. CBC:  Recent Labs Lab 02/23/16 1402 02/24/16 0617 02/25/16 1000 02/27/16 0547  WBC 19.1* 20.4* 18.1* 19.2*  NEUTROABS 13.7* 14.0* 13.0* 15.7*  HGB 8.2* 7.7* 8.0* 7.6*  HCT 24.0* 23.1* 23.3* 21.6*  MCV 88.9 87.8 88.6 88.2  PLT 526* 528* 484* 449*   Cardiac Enzymes: No results for input(s): CKTOTAL, CKMB, CKMBINDEX, TROPONINI in the last 168 hours. BNP (last 3 results)  Recent Labs  08/02/15 2307 11/28/15 0520 02/11/16 1006  BNP 503.2* 422.5* 381.0*    ProBNP (last 3 results) No results for input(s): PROBNP in the last 8760  hours.  CBG:  Recent Labs Lab 02/23/16 1439  GLUCAP 101*    No results found for this or any previous visit (from the past 240 hour(s)).   Studies: Dg Chest 2 View  Result Date: 02/23/2016 CLINICAL DATA:  SICKLE CELL CRISIS EXAM: CHEST  2 VIEW COMPARISON:  CT 02/21/2016 FINDINGS: LEFT-SIDED POWER PORT UNCHANGED. STABLE CARDIAC SILHOUETTE. THERE IS MILD CENTRAL VENOUS CONGESTION. NO FOCAL INFILTRATE. NO PLEURAL FLUID. NO PNEUMOTHORAX. IMPRESSION: 1. STABLE CARDIOMEGALY. 2. STABLE CENTRAL VENOUS PULMONARY CONGESTION. 3. NO EVIDENCE OF PULMONARY INFILTRATE Electronically Signed   By: Suzy Bouchard M.D.   On: 02/23/2016 16:35   Dg Chest 2 View  Result Date: 02/21/2016 CLINICAL DATA:  New onset chest pain beginning today. EXAM: CHEST  2 VIEW COMPARISON:  02/19/2016 FINDINGS: Chronic enlargement of the cardiac silhouette. Power port tip in the SVC just above the right atrium as seen previously. Pulmonary venous hypertension, possibly with early interstitial edema. No advanced edema. No consolidation or collapse. No effusions. IMPRESSION: Chronic cardiomegaly. Worsening of the pulmonary vascular pattern suggesting venous hypertension with early interstitial edema. Electronically Signed   By: Nelson Chimes M.D.   On: 02/21/2016 07:49   Dg Chest 2 View  Result Date: 02/19/2016 CLINICAL DATA:  Sickle cell pain crisis, worsening bilat lower chest pain since yesterday EXAM: CHEST  2 VIEW COMPARISON:  02/11/2016 FINDINGS: Left Port-A-Cath is unchanged in position. Midline trachea. Moderate cardiomegaly. Mediastinal contours otherwise within normal limits. No pleural effusion or pneumothorax. No congestive failure. Lower lobe predominant pulmonary interstitial thickening is chronic and nonspecific. No lobar consolidation. IMPRESSION: Cardiomegaly with chronic interstitial thickening. No acute findings. Electronically Signed   By: Abigail Miyamoto M.D.   On: 02/19/2016 07:18   Dg Chest 2 View  Result Date:  01/31/2016 CLINICAL DATA:  Bilateral rib pain. History of hypertension, chronic pulmonary embolism and sickle cell anemia. EXAM: CHEST  2 VIEW COMPARISON:  Chest radiograph January 27, 2016 and CT chest January 05, 2016 FINDINGS: Cardiac silhouette is moderately enlarged and unchanged. Mediastinal silhouette is nonsuspicious. Pulmonary vascular congestion without pleural effusion or focal consolidation. RIGHT mid and lower lung zone atelectasis/scarring. No pneumothorax. Single lumen LEFT chest Port-A-Cath. Surgical clips in the included right abdomen compatible with cholecystectomy. Osseous structures are nonacute. Focal sclerosis in the humeral head compatible with infarcts. IMPRESSION: Stable cardiomegaly and pulmonary vascular congestion. RIGHT atelectasis/scarring. Electronically Signed   By: Elon Alas M.D.   On: 01/31/2016 04:46   Ct Angio Chest Pe W Or Wo Contrast  Result Date: 02/21/2016 CLINICAL DATA:  Sickle cell disease.  Chest pain, bilateral rib pain EXAM: CT ANGIOGRAPHY CHEST WITH CONTRAST TECHNIQUE: Multidetector CT imaging of the chest was performed using the standard protocol during bolus administration of intravenous contrast. Multiplanar CT  image reconstructions and MIPs were obtained to evaluate the vascular anatomy. CONTRAST:  100 mL Isovue 370 COMPARISON:  01/05/2016, 11/10/2015 FINDINGS: Cardiovascular: Satisfactory opacification of the central and lobar pulmonary arteries with somewhat suboptimal opacification of the subsegmental pulmonary arteries. Chronic bilateral lower lobe subsegmental occlusive pulmonary emboli. No acute pulmonary embolus. Main pulmonary artery is enlarged measuring 4.2 cm in diameter. Right heart strain (RV/ LV equals 1.0). Severe cardiomegaly. Trace pericardial effusion. Thoracic aorta is normal in caliber. No thoracic aortic dissection or aneurysm. Mediastinum/Nodes: Mild mediastinal lymphadenopathy is unchanged compared with multiple prior  examinations. Left-sided Port-A-Cath with the tip at the cavoatrial junction. Normal esophagus. Normal thyroid. Lungs/Pleura: No pleural effusion or pneumothorax. Bilateral patchy areas of ground-glass opacity in the upper and lower lobes both central and peripheral distribution similar in appearance to the prior exam. No focal consolidation, pleural effusion or pneumothorax. Upper Abdomen: Involuted and calcified spleen consistent with history of sickle cell disease. No acute upper abdominal abnormality. Musculoskeletal: No acute osseous abnormality. 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 Review of the MIP images confirms the above findings. IMPRESSION: 1. No acute pulmonary embolus. Chronic bilateral lower lobe subsegmental pulmonary emboli unchanged in the prior exam. Chronic right heart strain with dilatation of the main pulmonary artery as can be seen with pulmonary arterial hypertension. 2. Stable cardiomegaly. 3. Patchy ground-glass opacities bilaterally suggesting mild pulmonary edema versus an infectious or inflammatory etiology. 4. Stable mediastinal lymphadenopathy. Electronically Signed   By: Kathreen Devoid   On: 02/21/2016 10:46   US Renal  Result Date: 02/11/2016 CLINICAL DATA:  Renal failure.  Sickle cell disease EXAM: RENAL / URINARY TRACT ULTRASOUND COMPLETE COMPARISON:  CT abdomen and pelvis December 15, 2015 FINDINGS: Right Kidney: Length: 11.5 cm. Echogenicity and renal cortical thickness are within normal limits. No mass, perinephric fluid, or hydronephrosis visualized. No sonographically demonstrable calculus or ureterectasis. Left Kidney: Length: 11.7 cm. Echogenicity and renal cortical thickness are within normal limits. No mass, perinephric fluid, or hydronephrosis visualized. No sonographically demonstrable calculus or ureterectasis. Bladder: Appears normal for degree of bladder distention. IMPRESSION: Study within normal limits.  Electronically Signed   By: Lowella Grip III M.D.   On: 02/11/2016 15:20   Dg Chest Portable 1 View  Result Date: 02/11/2016 CLINICAL DATA:  Sickle cell anemia.  Hypoxia. EXAM: PORTABLE CHEST 1 VIEW COMPARISON:  01/31/2016. FINDINGS: PowerPort catheter with lead tip projected over right atrium. Cardiomegaly with normal pulmonary vascularity. No focal infiltrate. No pleural effusion or pneumothorax. IMPRESSION: 1. PowerPort catheter noted with lead tip projected over right atrium. 2. Cardiomegaly with pulmonary vascular prominence and bilateral interstitial prominence consistent with mild congestive heart failure. Electronically Signed   By: [place]   On: 02/11/2016 10:22    Scheduled Meds: . ambrisentan  5 mg Oral QHS  . aspirin  81 mg Oral Daily  . cholecalciferol  2,000 Units Oral Daily  . Deferasirox  1,080 mg Oral Daily  . folic acid  1 mg Oral Daily  . furosemide  40 mg Intravenous Daily  . gabapentin  300 mg Oral TID  . HYDROmorphone   Intravenous Q4H  .  HYDROmorphone (DILAUDID) injection  2 mg Intravenous Q4H  . HYDROmorphone  4 mg Oral Q4H  . hydroxyurea  1,000 mg Oral Daily  . ketorolac  30 mg Intravenous Q6H  . morphine  30 mg Oral Q12H  . rivaroxaban  20 mg Oral Q supper  . senna-docusate  1 tablet Oral BID  Continuous Infusions: . dextrose 5 % and 0.45% NaCl 10 mL/hr at 02/28/16 1236    Principal Problem:   Sickle cell crisis ([place]) Active Problems:   Hb-SS disease with crisis ([place])     In excess of 25 minutes spent during this visit. Greater than 50% involved face to face contact with the patient for assessment, counseling and coordination of care.

## 2016-02-29 ENCOUNTER — Inpatient Hospital Stay (HOSPITAL_COMMUNITY): Payer: Medicare Other

## 2016-02-29 DIAGNOSIS — R701 Abnormal plasma viscosity: Secondary | ICD-10-CM

## 2016-02-29 LAB — BASIC METABOLIC PANEL
Anion gap: 5 (ref 5–15)
BUN: 20 mg/dL (ref 6–20)
CHLORIDE: 106 mmol/L (ref 101–111)
CO2: 27 mmol/L (ref 22–32)
CREATININE: 0.94 mg/dL (ref 0.61–1.24)
Calcium: 8.8 mg/dL — ABNORMAL LOW (ref 8.9–10.3)
GFR calc non Af Amer: >60 mL/min (ref >60)
Glucose, Bld: 112 mg/dL — ABNORMAL HIGH (ref 65–99)
Potassium: 4.3 mmol/L (ref 3.5–5.1)
Sodium: 138 mmol/L (ref 135–145)

## 2016-02-29 LAB — CBC WITH DIFFERENTIAL/PLATELET
BASOS ABS: 0 10*3/uL (ref 0.0–0.1)
BASOS PCT: 0 %
EOS PCT: 4 %
Eosinophils Absolute: 0.7 10*3/uL (ref 0.0–0.7)
HCT: 19.6 % — ABNORMAL LOW (ref 39.0–52.0)
Hemoglobin: 6.9 g/dL — CL (ref 13.0–17.0)
Lymphocytes Relative: 10 %
Lymphs Abs: 1.6 10*3/uL (ref 0.7–4.0)
MCH: 30.4 pg (ref 26.0–34.0)
MCHC: 35.2 g/dL (ref 30.0–36.0)
MCV: 86.3 fL (ref 78.0–100.0)
MONO ABS: 2.7 10*3/uL — AB (ref 0.1–1.0)
Monocytes Relative: 17 %
Neutro Abs: 10.7 10*3/uL — ABNORMAL HIGH (ref 1.7–7.7)
Neutrophils Relative %: 68 %
Platelets: 376 10*3/uL (ref 150–400)
RBC: 2.27 MIL/uL — ABNORMAL LOW (ref 4.22–5.81)
RDW: 18.7 % — AB (ref 11.5–15.5)
WBC: 15.7 10*3/uL — ABNORMAL HIGH (ref 4.0–10.5)

## 2016-02-29 LAB — RETICULOCYTES
RBC.: 2.27 MIL/uL — AB (ref 4.22–5.81)
RETIC COUNT ABSOLUTE: 97.6 10*3/uL (ref 19.0–186.0)
Retic Ct Pct: 4.3 % — ABNORMAL HIGH (ref 0.4–3.1)

## 2016-02-29 IMAGING — CR DG CHEST 2V
2 series · 2 of 2 positions shown · non-contrast
Comparison: DG CHEST 1V PORT dated 03/28/2013

CLINICAL DATA: Sickle cell crisis and cough.

EXAM:
CHEST  2 VIEW

[w chest pa]
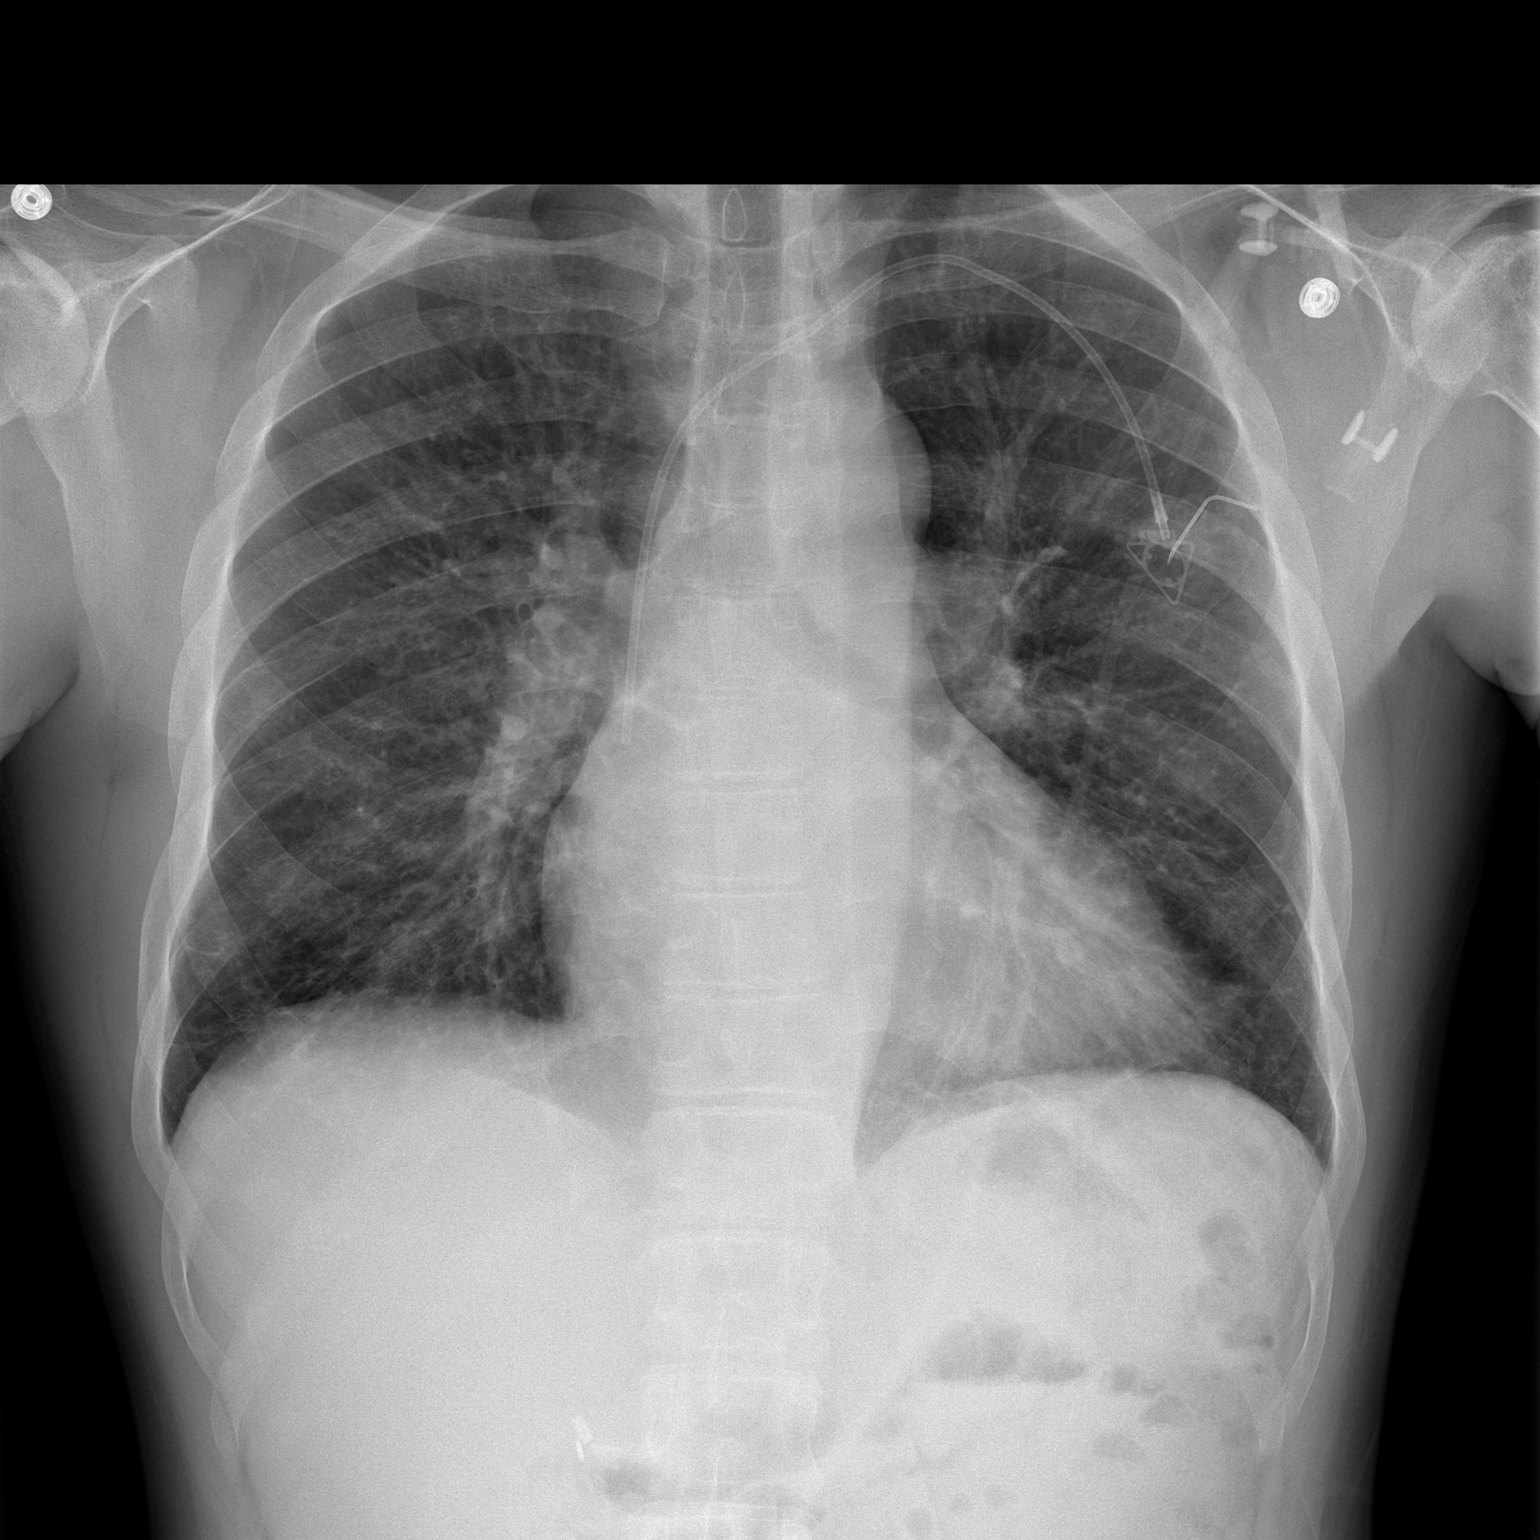

[w chest lat]
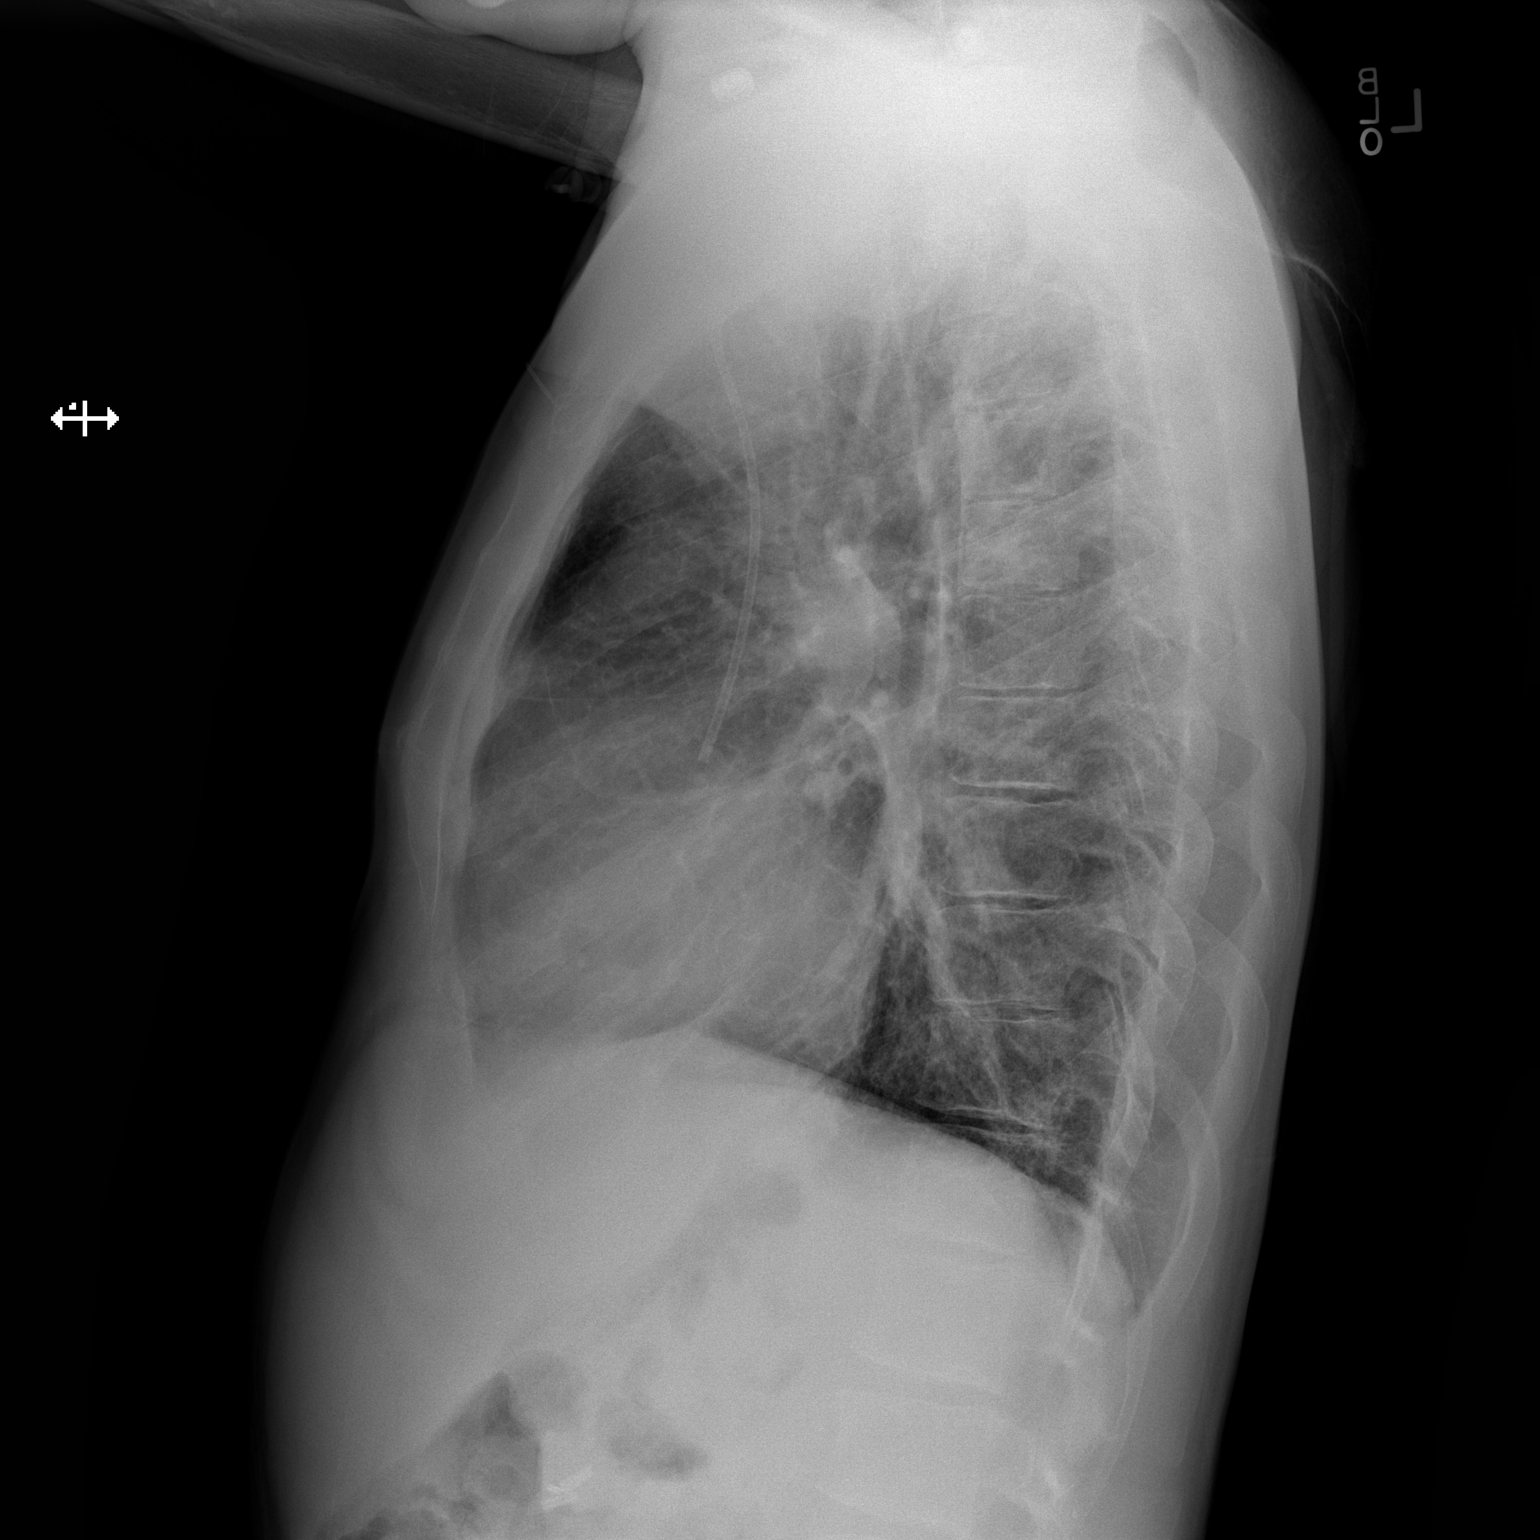

[2 of 2 positions shown; findings below may reference images not displayed]

FINDINGS: Prominent interstitial lung markings suggest chronic changes. No
focal airspace disease or overt pulmonary edema. There is a left
subclavian Port-A-Cath. Catheter tip in the lower SVC. Heart size is
normal and negative for pleural effusions.
IMPRESSION: Chronic lung changes without acute findings.

## 2016-02-29 MED ORDER — HYDROMORPHONE 1 MG/ML IV SOLN
INTRAVENOUS | Status: DC
  Administered 2016-02-29: 2 mg via INTRAVENOUS
  Administered 2016-02-29: 9.7 mg via INTRAVENOUS
  Administered 2016-02-29: 2 mg via INTRAVENOUS
  Administered 2016-02-29: 4.9 mg via INTRAVENOUS
  Administered 2016-02-29 (×2): 4.2 mg via INTRAVENOUS
  Administered 2016-02-29: 25 mg via INTRAVENOUS
  Administered 2016-02-29: 2 mg via INTRAVENOUS
  Administered 2016-02-29: 2.7 mg via INTRAVENOUS
  Administered 2016-03-01: 10.4 mg via INTRAVENOUS
  Administered 2016-03-01: 8.3 mg via INTRAVENOUS
  Administered 2016-03-01: 2 mg via INTRAVENOUS
  Administered 2016-03-01: 2.1 mg via INTRAVENOUS
  Administered 2016-03-01: 2 mg via INTRAVENOUS
  Administered 2016-03-01: 4.2 mg via INTRAVENOUS
  Filled 2016-02-29 (×3): qty 25

## 2016-02-29 NOTE — Progress Notes (Signed)
SICKLE CELL SERVICE PROGRESS NOTE  [name] [id number] DOB: Sep 19, 1980 DOA: 02/23/2016 PCP: [name], MD  Assessment/Plan: Principal Problem:   Sickle cell crisis ([place]) Active Problems:   Hb-SS disease with crisis ([name])  1. Hypoxia:Weight back to baseline but patient still hypoxic. Will repeat CXR today.  Continue supplemental oxygen.  2. Hb SS with crisis:Will continue PCA at current dose,continue Toradol and alternating scheduled oral Dilaudid and change  IV Dilaudid every 2 hours as needed. 3. Leukocytosis:  Slight improvement. Will continue to monitor.  4. Anemia of Chronic Disease: Hb at baseline but with reticulocytopenia. Will hold Hydrea and Jadenu.  5. Chronic pain: Continue MS Contin.  6. Chronic Anticoagulation: Continue Xarelto  Code Status: Full Code Family Communication: N/A Disposition Plan: Not yet ready for discharge  [place].  Pager [phone]. If 7PM-7AM, please contact night-coverage.  02/29/2016, 10:10 AM  LOS: 5 days   Interim History: Pt reports pain continues to be  6/10. He  Has used 46.9 mg of Dilaudid on the PCA with 71/67:demands/deliveries in addition to 12 mg in clinician assisted doses of Dilaudid . Encouraged use of ICS. Last BM today.  Consultants:  None  Procedures:  None  Antibiotics:  None    Objective: Vitals:   02/29/16 0400 02/29/16 0534 02/29/16 0800 02/29/16 0930  BP:  126/80  109/72  Pulse:  (!) 102  84  Resp: 19 (!) 22 18 14   Temp:  98.8 F (37.1 C)  98.6 F (37 C)  TempSrc:  Oral  Oral  SpO2: 94% 90% 92% 98%  Weight:  80.9 kg (178 lb 4.8 oz)    Height:       Weight change: -2.54 kg (-5 lb 9.6 oz)  Intake/Output Summary (Last 24 hours) at 02/29/16 1010 Last data filed at 02/29/16 0921  Gross per 24 hour  Intake           452.17 ml  Output             3400 ml  Net         -2947.83 ml    General: Alert, awake, oriented x3, in no acute distress. Appears in less pain today.   HEENT: [place]/AT PEERL, EOMI, icteric at baseline.  Heart: Regular rate and rhythm, without murmurs, rubs, gallops, PMI non-displaced, no heaves or thrills on palpation.  Lungs: Clear to auscultation, no wheezing or rhonchi noted. No increased vocal fremitus resonant to percussion  Abdomen: Soft, nontender, nondistended, positive bowel sounds, no masses no hepatosplenomegaly noted.  Neuro: No focal neurological deficits noted cranial nerves II through XII grossly intact.  Strength at functional baseline in bilateral upper and lower extremities. Musculoskeletal: No warmth swelling or erythema around joints, no spinal tenderness noted. Psychiatric: Patient alert and oriented x3, good insight and cognition, good recent to remote recall.    Data Reviewed: Basic Metabolic Panel:  Recent Labs Lab 02/23/16 1402 02/24/16 0617 02/25/16 1000 02/27/16 0547 02/29/16 0818  NA 138 137 138 139 138  K 3.3* 3.4* 4.3 4.6 4.3  CL 111 107 106 108 106  CO2 22 21* 26 24 27   GLUCOSE 98 124* 117* 113* 112*  BUN 9 8 12 18 20   CREATININE 0.73 0.92 0.89 0.70 0.94  CALCIUM 9.0 8.7* 8.6* 8.8* 8.8*  MG  --   --  1.8  --   --    Liver Function Tests:  Recent Labs Lab 02/23/16 1402  AST 30  ALT 24  ALKPHOS 78  BILITOT  4.2*  PROT 7.4  ALBUMIN 4.2   No results for input(s): LIPASE, AMYLASE in the last 168 hours. No results for input(s): AMMONIA in the last 168 hours. CBC:  Recent Labs Lab 02/23/16 1402 02/24/16 0617 02/25/16 1000 02/27/16 0547 02/29/16 0818  WBC 19.1* 20.4* 18.1* 19.2* 15.7*  NEUTROABS 13.7* 14.0* 13.0* 15.7* 10.7*  HGB 8.2* 7.7* 8.0* 7.6* 6.9*  HCT 24.0* 23.1* 23.3* 21.6* 19.6*  MCV 88.9 87.8 88.6 88.2 86.3  PLT 526* 528* 484* 449* 376   Cardiac Enzymes: No results for input(s): CKTOTAL, CKMB, CKMBINDEX, TROPONINI in the last 168 hours. BNP (last 3 results)  Recent Labs  08/02/15 2307 11/28/15 0520 02/11/16 1006  BNP 503.2* 422.5* 381.0*    ProBNP (last 3  results) No results for input(s): PROBNP in the last 8760 hours.  CBG:  Recent Labs Lab 02/23/16 1439  GLUCAP 101*    No results found for this or any previous visit (from the past 240 hour(s)).   Studies: Dg Chest 2 View  Result Date: 02/23/2016 CLINICAL DATA:  SICKLE CELL CRISIS EXAM: CHEST  2 VIEW COMPARISON:  CT 02/21/2016 FINDINGS: LEFT-SIDED POWER PORT UNCHANGED. STABLE CARDIAC SILHOUETTE. THERE IS MILD CENTRAL VENOUS CONGESTION. NO FOCAL INFILTRATE. NO PLEURAL FLUID. NO PNEUMOTHORAX. IMPRESSION: 1. STABLE CARDIOMEGALY. 2. STABLE CENTRAL VENOUS PULMONARY CONGESTION. 3. NO EVIDENCE OF PULMONARY INFILTRATE Electronically Signed   By: Suzy Bouchard M.D.   On: 02/23/2016 16:35   Dg Chest 2 View  Result Date: 02/21/2016 CLINICAL DATA:  New onset chest pain beginning today. EXAM: CHEST  2 VIEW COMPARISON:  02/19/2016 FINDINGS: Chronic enlargement of the cardiac silhouette. Power port tip in the SVC just above the right atrium as seen previously. Pulmonary venous hypertension, possibly with early interstitial edema. No advanced edema. No consolidation or collapse. No effusions. IMPRESSION: Chronic cardiomegaly. Worsening of the pulmonary vascular pattern suggesting venous hypertension with early interstitial edema. Electronically Signed   By: Nelson Chimes M.D.   On: 02/21/2016 07:49   Dg Chest 2 View  Result Date: 02/19/2016 CLINICAL DATA:  Sickle cell pain crisis, worsening bilat lower chest pain since yesterday EXAM: CHEST  2 VIEW COMPARISON:  02/11/2016 FINDINGS: Left Port-A-Cath is unchanged in position. Midline trachea. Moderate cardiomegaly. Mediastinal contours otherwise within normal limits. No pleural effusion or pneumothorax. No congestive failure. Lower lobe predominant pulmonary interstitial thickening is chronic and nonspecific. No lobar consolidation. IMPRESSION: Cardiomegaly with chronic interstitial thickening. No acute findings. Electronically Signed   By: Abigail Miyamoto M.D.    On: 02/19/2016 07:18   Dg Chest 2 View  Result Date: 01/31/2016 CLINICAL DATA:  Bilateral rib pain. History of hypertension, chronic pulmonary embolism and sickle cell anemia. EXAM: CHEST  2 VIEW COMPARISON:  Chest radiograph January 27, 2016 and CT chest January 05, 2016 FINDINGS: Cardiac silhouette is moderately enlarged and unchanged. Mediastinal silhouette is nonsuspicious. Pulmonary vascular congestion without pleural effusion or focal consolidation. RIGHT mid and lower lung zone atelectasis/scarring. No pneumothorax. Single lumen LEFT chest Port-A-Cath. Surgical clips in the included right abdomen compatible with cholecystectomy. Osseous structures are nonacute. Focal sclerosis in the humeral head compatible with infarcts. IMPRESSION: Stable cardiomegaly and pulmonary vascular congestion. RIGHT atelectasis/scarring. Electronically Signed   By: Elon Alas M.D.   On: 01/31/2016 04:46   Ct Angio Chest Pe W Or Wo Contrast  Result Date: 02/21/2016 CLINICAL DATA:  Sickle cell disease.  Chest pain, bilateral rib pain EXAM: CT ANGIOGRAPHY CHEST WITH CONTRAST TECHNIQUE: Multidetector CT imaging of the chest  was performed using the standard protocol during bolus administration of intravenous contrast. Multiplanar CT image reconstructions and MIPs were obtained to evaluate the vascular anatomy. CONTRAST:  100 mL Isovue 370 COMPARISON:  01/05/2016, 11/10/2015 FINDINGS: Cardiovascular: Satisfactory opacification of the central and lobar pulmonary arteries with somewhat suboptimal opacification of the subsegmental pulmonary arteries. Chronic bilateral lower lobe subsegmental occlusive pulmonary emboli. No acute pulmonary embolus. Main pulmonary artery is enlarged measuring 4.2 cm in diameter. Right heart strain (RV/ LV equals 1.0). Severe cardiomegaly. Trace pericardial effusion. Thoracic aorta is normal in caliber. No thoracic aortic dissection or aneurysm. Mediastinum/Nodes: Mild mediastinal  lymphadenopathy is unchanged compared with multiple prior examinations. Left-sided Port-A-Cath with the tip at the cavoatrial junction. Normal esophagus. Normal thyroid. Lungs/Pleura: No pleural effusion or pneumothorax. Bilateral patchy areas of ground-glass opacity in the upper and lower lobes both central and peripheral distribution similar in appearance to the prior exam. No focal consolidation, pleural effusion or pneumothorax. Upper Abdomen: Involuted and calcified spleen consistent with history of sickle cell disease. No acute upper abdominal abnormality. Musculoskeletal: No acute osseous abnormality. 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 Review of the MIP images confirms the above findings. IMPRESSION: 1. No acute pulmonary embolus. Chronic bilateral lower lobe subsegmental pulmonary emboli unchanged in the prior exam. Chronic right heart strain with dilatation of the main pulmonary artery as can be seen with pulmonary arterial hypertension. 2. Stable cardiomegaly. 3. Patchy ground-glass opacities bilaterally suggesting mild pulmonary edema versus an infectious or inflammatory etiology. 4. Stable mediastinal lymphadenopathy. Electronically Signed   By: Kathreen Devoid   On: 02/21/2016 10:46   US Renal  Result Date: 02/11/2016 CLINICAL DATA:  Renal failure.  Sickle cell disease EXAM: RENAL / URINARY TRACT ULTRASOUND COMPLETE COMPARISON:  CT abdomen and pelvis December 15, 2015 FINDINGS: Right Kidney: Length: 11.5 cm. Echogenicity and renal cortical thickness are within normal limits. No mass, perinephric fluid, or hydronephrosis visualized. No sonographically demonstrable calculus or ureterectasis. Left Kidney: Length: 11.7 cm. Echogenicity and renal cortical thickness are within normal limits. No mass, perinephric fluid, or hydronephrosis visualized. No sonographically demonstrable calculus or ureterectasis. Bladder: Appears normal for degree of bladder  distention. IMPRESSION: Study within normal limits. Electronically Signed   By: Lowella Grip III M.D.   On: 02/11/2016 15:20   Dg Chest Portable 1 View  Result Date: 02/11/2016 CLINICAL DATA:  Sickle cell anemia.  Hypoxia. EXAM: PORTABLE CHEST 1 VIEW COMPARISON:  01/31/2016. FINDINGS: PowerPort catheter with lead tip projected over right atrium. Cardiomegaly with normal pulmonary vascularity. No focal infiltrate. No pleural effusion or pneumothorax. IMPRESSION: 1. PowerPort catheter noted with lead tip projected over right atrium. 2. Cardiomegaly with pulmonary vascular prominence and bilateral interstitial prominence consistent with mild congestive heart failure. Electronically Signed   By: [place]   On: 02/11/2016 10:22    Scheduled Meds: . ambrisentan  5 mg Oral QHS  . aspirin  81 mg Oral Daily  . cholecalciferol  2,000 Units Oral Daily  . Deferasirox  1,080 mg Oral Daily  . folic acid  1 mg Oral Daily  . furosemide  40 mg Intravenous Daily  . gabapentin  300 mg Oral TID  . HYDROmorphone   Intravenous Q4H  . HYDROmorphone  4 mg Oral Q4H  . morphine  30 mg Oral Q12H  . rivaroxaban  20 mg Oral Q supper  . senna-docusate  1 tablet Oral BID   Continuous Infusions: . dextrose 5 % and 0.45% NaCl 10 mL/hr  at 02/28/16 1236    Principal Problem:   Sickle cell crisis ([place]) Active Problems:   Hb-SS disease with crisis ([place])     In excess of 25 minutes spent during this visit. Greater than 50% involved face to face contact with the patient for assessment, counseling and coordination of care.

## 2016-02-29 NOTE — Progress Notes (Signed)
CRITICAL VALUE ALERT  Critical value received:  Hgb 6.9  Date of notification:  02/29/2016   Time of notification:  9:11 AM   Critical value read back:Yes.    Nurse who received alert:  Joaquin Courts   MD notified (1st page):  Dr Zigmund Daniel  Time of first page:  9:11 AM   MD notified (2nd page):  Time of second page:  Responding MD:  Dr Zigmund Daniel  Time MD responded:  9:12 AM

## 2016-03-01 LAB — CBC WITH DIFFERENTIAL/PLATELET
BASOS PCT: 1 %
Basophils Absolute: 0.1 10*3/uL (ref 0.0–0.1)
EOS ABS: 0.6 10*3/uL (ref 0.0–0.7)
EOS PCT: 5 %
HEMATOCRIT: 22.1 % — AB (ref 39.0–52.0)
Hemoglobin: 7.6 g/dL — ABNORMAL LOW (ref 13.0–17.0)
LYMPHS PCT: 16 %
Lymphs Abs: 2.1 10*3/uL (ref 0.7–4.0)
MCH: 30 pg (ref 26.0–34.0)
MCHC: 34.4 g/dL (ref 30.0–36.0)
MCV: 87.4 fL (ref 78.0–100.0)
Monocytes Absolute: 1.4 10*3/uL — ABNORMAL HIGH (ref 0.1–1.0)
Monocytes Relative: 11 %
NEUTROS ABS: 8.7 10*3/uL — AB (ref 1.7–7.7)
NEUTROS PCT: 67 %
Platelets: 412 10*3/uL — ABNORMAL HIGH (ref 150–400)
RBC: 2.53 MIL/uL — ABNORMAL LOW (ref 4.22–5.81)
RDW: 18.9 % — ABNORMAL HIGH (ref 11.5–15.5)
WBC: 12.9 10*3/uL — ABNORMAL HIGH (ref 4.0–10.5)

## 2016-03-01 LAB — BASIC METABOLIC PANEL
ANION GAP: 6 (ref 5–15)
BUN: 19 mg/dL (ref 6–20)
CALCIUM: 9 mg/dL (ref 8.9–10.3)
CHLORIDE: 105 mmol/L (ref 101–111)
CO2: 26 mmol/L (ref 22–32)
CREATININE: 0.87 mg/dL (ref 0.61–1.24)
GFR calc Af Amer: >60 mL/min (ref >60)
GFR calc non Af Amer: >60 mL/min (ref >60)
Glucose, Bld: 148 mg/dL — ABNORMAL HIGH (ref 65–99)
Potassium: 4.1 mmol/L (ref 3.5–5.1)
SODIUM: 137 mmol/L (ref 135–145)

## 2016-03-01 LAB — RETICULOCYTES
RBC.: 2.53 MIL/uL — ABNORMAL LOW (ref 4.22–5.81)
RETIC CT PCT: 3.8 % — AB (ref 0.4–3.1)
Retic Count, Absolute: 96.1 10*3/uL (ref 19.0–186.0)

## 2016-03-01 MED ORDER — HEPARIN SOD (PORK) LOCK FLUSH 100 UNIT/ML IV SOLN
500.0000 [IU] | INTRAVENOUS | Status: AC | PRN
  Administered 2016-03-01: 500 [IU]
  Filled 2016-03-01: qty 5

## 2016-03-01 MED ORDER — HYDROXYUREA 500 MG PO CAPS: 1000.0000 mg | ORAL_CAPSULE | Freq: Every day | ORAL | Status: DC

## 2016-03-01 NOTE — Progress Notes (Signed)
Nursing Discharge Summary  Patient ID: [name] MRN: [id number] DOB/AGE: July 31, 1980 36 y.o.  Admit date: 02/23/2016 Discharge date: 03/01/2016  Discharged Condition: good  Disposition: 01-Home or Self Care  Follow-up Information    [name], MD Follow up on 03/07/2016.   Specialty:  Internal Medicine Why:  Go to clinic between 8:00 am - 4:00 pm fro labs (CBC and Reticulocyte count) to determine about resuming Hydrea Contact information: [place] [address] [phone]        Dorena Dew, FNP Follow up on 03/09/2016.   Specialty:  Family Medicine Why:  10:00am Contact information: [address] [address] [phone]           Prescriptions Given: No new prescriptions given.  Patient follow up appointments and medications discussed.  Patient verbalized understanding without further questions.    Means of Discharge: Patient wishes to ambulate downstairs to be discharged via private vehicle.    Signed: Buel Ream 03/01/2016, 4:19 PM

## 2016-03-01 NOTE — Discharge Summary (Addendum)
[name] MRN: [id number] DOB/AGE: Jul 25, 1980 36 y.o.  Admit date: 02/23/2016 Discharge date: 03/01/2016  Primary Care Physician:  [name], MD   Discharge Diagnoses:   Patient Active Problem List   Diagnosis Date Noted  . Cor pulmonale, chronic ([place]) 08/25/2014    Priority: High  . Pulmonary HTN 06/18/2013    Priority: High  . Functional asplenia     Priority: High  . Anemia of chronic disease 06/25/2014    Priority: Medium  . Chronic respiratory failure with hypoxia ([place]) 03/14/2014    Priority: Medium  . Chronic anticoagulation 08/22/2013    Priority: Medium  . Avascular necrosis ([place])     Priority: Low  . Slurred speech 11/10/2015  . Ischemic stroke ([place])   . Anemia 01/31/2015  . Chronic anemia   . Hb-SS disease without crisis ([place]) 10/07/2014  . Acute on chronic respiratory failure with hypoxia ([place])   . Abnormal EKG 09/10/2014  . Sickle cell anemia ([place]) 06/25/2014  . PAH (pulmonary artery hypertension) 03/18/2014  . Paralytic strabismus, external ophthalmoplegia   . Chronic pain syndrome 12/12/2013  . Essential hypertension 08/22/2013  . Vitamin D deficiency 02/13/2013  . Hx of pulmonary embolus 06/29/2012  . Hemochromatosis 12/14/2011    DISCHARGE MEDICATION: Allergies as of 03/01/2016   No Known Allergies     Medication List    TAKE these medications   ambrisentan 5 MG tablet Commonly known as:  LETAIRIS Take 5 mg by mouth at bedtime.   aspirin 81 MG chewable tablet Chew 1 tablet (81 mg total) by mouth daily.   cholecalciferol 1000 units tablet Commonly known as:  VITAMIN D Take 2,000 Units by mouth daily.   folic acid 1 MG tablet Commonly known as:  FOLVITE Take 1 mg by mouth daily.   gabapentin 300 MG capsule Commonly known as:  NEURONTIN Take 1 capsule (300 mg total) by mouth 3 (three) times daily.   hydroxyurea 500 MG capsule Commonly known as:  HYDREA Take 2 capsules (1,000 mg total) by mouth daily. Hold this  medication until CBC and reticulocytes rechecked in 1 week and then restart to be determined by Primary Provider.  May take with food to minimize GI side effects. Hold Hydrea until evaluated by Primary Provider or Hematologist. What changed:  additional instructions   JADENU 360 MG Tabs Generic drug:  Deferasirox Take 1,080 mg by mouth daily.   morphine 30 MG 12 hr tablet Commonly known as:  MS CONTIN Take 1 tablet (30 mg total) by mouth every 12 (twelve) hours.   potassium chloride SA 20 MEQ tablet Commonly known as:  K-DUR,KLOR-CON Take 2 tablets (40 mEq total) by mouth daily.   rivaroxaban 20 MG Tabs tablet Commonly known as:  XARELTO Take 1 tablet (20 mg total) by mouth at bedtime.   zolpidem 10 MG tablet Commonly known as:  AMBIEN Take 1 tablet (10 mg total) by mouth at bedtime as needed for sleep.         Consults:    SIGNIFICANT DIAGNOSTIC STUDIES:  Dg Chest 2 View  Result Date: 02/29/2016 CLINICAL DATA:  Hypoxia and weakness.  Sickle cell. EXAM: CHEST  2 VIEW COMPARISON:  02/23/2016. FINDINGS: Trachea is midline. Left subclavian power port tip is in the high right atrium. Heart is enlarged, stable. Mild patchy right upper lobe airspace opacification. Streaky atelectasis or scarring in the left lower lobe. No pleural fluid. IMPRESSION: Mild right upper lobe patchy airspace opacification. Electronically Signed   By: Rip Harbour  Blietz M.D.   On: 02/29/2016 12:53   Dg Chest 2 View  Result Date: 02/23/2016 CLINICAL DATA:  SICKLE CELL CRISIS EXAM: CHEST  2 VIEW COMPARISON:  CT 02/21/2016 FINDINGS: LEFT-SIDED POWER PORT UNCHANGED. STABLE CARDIAC SILHOUETTE. THERE IS MILD CENTRAL VENOUS CONGESTION. NO FOCAL INFILTRATE. NO PLEURAL FLUID. NO PNEUMOTHORAX. IMPRESSION: 1. STABLE CARDIOMEGALY. 2. STABLE CENTRAL VENOUS PULMONARY CONGESTION. 3. NO EVIDENCE OF PULMONARY INFILTRATE Electronically Signed   By: Suzy Bouchard M.D.   On: 02/23/2016 16:35   Dg Chest 2 View  Result Date:  02/21/2016 CLINICAL DATA:  New onset chest pain beginning today. EXAM: CHEST  2 VIEW COMPARISON:  02/19/2016 FINDINGS: Chronic enlargement of the cardiac silhouette. Power port tip in the SVC just above the right atrium as seen previously. Pulmonary venous hypertension, possibly with early interstitial edema. No advanced edema. No consolidation or collapse. No effusions. IMPRESSION: Chronic cardiomegaly. Worsening of the pulmonary vascular pattern suggesting venous hypertension with early interstitial edema. Electronically Signed   By: Nelson Chimes M.D.   On: 02/21/2016 07:49   Dg Chest 2 View  Result Date: 02/19/2016 CLINICAL DATA:  Sickle cell pain crisis, worsening bilat lower chest pain since yesterday EXAM: CHEST  2 VIEW COMPARISON:  02/11/2016 FINDINGS: Left Port-A-Cath is unchanged in position. Midline trachea. Moderate cardiomegaly. Mediastinal contours otherwise within normal limits. No pleural effusion or pneumothorax. No congestive failure. Lower lobe predominant pulmonary interstitial thickening is chronic and nonspecific. No lobar consolidation. IMPRESSION: Cardiomegaly with chronic interstitial thickening. No acute findings. Electronically Signed   By: Abigail Miyamoto M.D.   On: 02/19/2016 07:18   Ct Angio Chest Pe W Or Wo Contrast  Result Date: 02/21/2016 CLINICAL DATA:  Sickle cell disease.  Chest pain, bilateral rib pain EXAM: CT ANGIOGRAPHY CHEST WITH CONTRAST TECHNIQUE: Multidetector CT imaging of the chest was performed using the standard protocol during bolus administration of intravenous contrast. Multiplanar CT image reconstructions and MIPs were obtained to evaluate the vascular anatomy. CONTRAST:  100 mL Isovue 370 COMPARISON:  01/05/2016, 11/10/2015 FINDINGS: Cardiovascular: Satisfactory opacification of the central and lobar pulmonary arteries with somewhat suboptimal opacification of the subsegmental pulmonary arteries. Chronic bilateral lower lobe subsegmental occlusive pulmonary  emboli. No acute pulmonary embolus. Main pulmonary artery is enlarged measuring 4.2 cm in diameter. Right heart strain (RV/ LV equals 1.0). Severe cardiomegaly. Trace pericardial effusion. Thoracic aorta is normal in caliber. No thoracic aortic dissection or aneurysm. Mediastinum/Nodes: Mild mediastinal lymphadenopathy is unchanged compared with multiple prior examinations. Left-sided Port-A-Cath with the tip at the cavoatrial junction. Normal esophagus. Normal thyroid. Lungs/Pleura: No pleural effusion or pneumothorax. Bilateral patchy areas of ground-glass opacity in the upper and lower lobes both central and peripheral distribution similar in appearance to the prior exam. No focal consolidation, pleural effusion or pneumothorax. Upper Abdomen: Involuted and calcified spleen consistent with history of sickle cell disease. No acute upper abdominal abnormality. Musculoskeletal: No acute osseous abnormality. 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 Review of the MIP images confirms the above findings. IMPRESSION: 1. No acute pulmonary embolus. Chronic bilateral lower lobe subsegmental pulmonary emboli unchanged in the prior exam. Chronic right heart strain with dilatation of the main pulmonary artery as can be seen with pulmonary arterial hypertension. 2. Stable cardiomegaly. 3. Patchy ground-glass opacities bilaterally suggesting mild pulmonary edema versus an infectious or inflammatory etiology. 4. Stable mediastinal lymphadenopathy. Electronically Signed   By: Kathreen Devoid   On: 02/21/2016 10:46   US Renal  Result Date: 02/11/2016 CLINICAL DATA:  Renal failure.  Sickle cell disease EXAM: RENAL / URINARY TRACT ULTRASOUND COMPLETE COMPARISON:  CT abdomen and pelvis December 15, 2015 FINDINGS: Right Kidney: Length: 11.5 cm. Echogenicity and renal cortical thickness are within normal limits. No mass, perinephric fluid, or hydronephrosis visualized. No sonographically  demonstrable calculus or ureterectasis. Left Kidney: Length: 11.7 cm. Echogenicity and renal cortical thickness are within normal limits. No mass, perinephric fluid, or hydronephrosis visualized. No sonographically demonstrable calculus or ureterectasis. Bladder: Appears normal for degree of bladder distention. IMPRESSION: Study within normal limits. Electronically Signed   By: Lowella Grip III M.D.   On: 02/11/2016 15:20   Dg Chest Portable 1 View  Result Date: 02/11/2016 CLINICAL DATA:  Sickle cell anemia.  Hypoxia. EXAM: PORTABLE CHEST 1 VIEW COMPARISON:  01/31/2016. FINDINGS: PowerPort catheter with lead tip projected over right atrium. Cardiomegaly with normal pulmonary vascularity. No focal infiltrate. No pleural effusion or pneumothorax. IMPRESSION: 1. PowerPort catheter noted with lead tip projected over right atrium. 2. Cardiomegaly with pulmonary vascular prominence and bilateral interstitial prominence consistent with mild congestive heart failure. Electronically Signed   By: Marcello Moores  Register   On: 02/11/2016 10:22       No results found for this or any previous visit (from the past 240 hour(s)).  BRIEF ADMITTING H & P: TEDARIUS CLARO is a 36 y.o. male with medical history significant of sickle cell anemia who presents with acute on chronic back and side pain. He states that this is similar in presentation to his typical sickle cell pain crisis. He has been admitted under sickle cell team numerous times for this. No other complaints of chest pain, shortness of breath, cough, n/v/d, abdominal pain.     Hospital Course:  Present on Admission: . (Resolved) Sickle cell crisis ([place]) . (Resolved) Hb-SS disease with crisis ([place]) Pt was admitted with sickle cell crisis and Hypoxia. His pain was managed with IVF, IV Dilaudid via PCA and Toradol. As his pain decreased, he was transitioned to oral Dilaudid. Renal function remained normal throughout hospitalization with use of Toradol.  At  the time of discharge his pain is 5/10 and he is being discharged home on pre-admission dose of MS Contin and Oral Dilaudid.   Pt also developed a reticulocytopenia felt to be associated with Hydrea use. The Hydrea is being held and patient is to follow up with his PMD in 1 week to have labs checked to evaluate for bone marrow recovery and resumption of Hydrea. At the time of discharge his Hb is 7.6 which is at his baseline.  As is usual with Mr. Raikes, he had some acute hypoxia due to Acute Pulmonary Edema that is associate with IVF use for treatment of Sickle Cell Crisis. He received diuretics for several days to get back to his dry weight which is 79.3 Kg. At the time of discharge his weight is 74.3 kg and his Oxygen saturations were 91-94 % on RA with ambulation.   Pt was continued on other chronic medications for Chronic anticoagulation, Pulmonary Arterial Hypertension, Secondary Hemochromatosis and Vitamin D deficiency.   Disposition and Follow-up: Pt is discharged home in good condition. He has appointments for lab evaluation in 03/07/2016 and a clinic appointment with Ms. Hollis (NP at [facility]) on 03/07/2016 at 10:00am.   Discharge Instructions    Activity as tolerated - No restrictions    Complete by:  As directed    Diet general    Complete by:  As directed  DISCHARGE EXAM:  General: Alert, awake, oriented x3, in no apparent distress.  HEENT: [place]/AT PEERL, EOMI, anicteric Neck: Trachea midline, no masses, no thyromegal,y no JVD, no carotid bruit OROPHARYNX: Moist, No exudate/ erythema/lesions.  Heart: Regular rate and rhythm, without murmurs, rubs, gallops or S3. PMI non-displaced. Exam reveals no decreased pulses. Pulmonary/Chest: Normal effort. Breath sounds normal. No. Apnea. Clear to auscultation,no stridor,  no wheezing and no rhonchi noted. No respiratory distress and no tenderness noted. Abdomen: Soft, nontender, nondistended, normal bowel sounds, no masses no  hepatosplenomegaly noted. No fluid wave and no ascites. There is no guarding or rebound. Neuro: Alert and oriented to person, place and time. Normal motor skills, Displays no atrophy or tremors and exhibits normal muscle tone.  No focal neurological deficits noted cranial nerves II through XII grossly intact. No sensory deficit noted. Strength at baseline in bilateral upper and lower extremities. Gait normal. Musculoskeletal: No warm swelling or erythema around joints, no spinal tenderness noted. Psychiatric: Patient alert and oriented x3, good insight and cognition, good recent to remote recall.Mood, affect and judgement normal Lymph node survey: No cervical axillary or inguinal lymphadenopathy noted. Skin: Skin is warm and dry. No bruising, no ecchymosis and no rash noted. Pt is not diaphoretic. No erythema. No pallor     Blood pressure 108/76, pulse 84, temperature 98.8 F (37.1 C), temperature source Oral, resp. rate 14, height 6' (1.829 m), weight 74.3 kg (163 lb 11.2 oz), SpO2 91 %.   Recent Labs  02/29/16 0818 03/01/16 1102  NA 138 137  K 4.3 4.1  CL 106 105  CO2 27 26  GLUCOSE 112* 148*  BUN 20 19  CREATININE 0.94 0.87  CALCIUM 8.8* 9.0   No results for input(s): AST, ALT, ALKPHOS, BILITOT, PROT, ALBUMIN in the last 72 hours. No results for input(s): LIPASE, AMYLASE in the last 72 hours.  Recent Labs  02/29/16 0818 03/01/16 1102  WBC 15.7* 12.9*  NEUTROABS 10.7* 8.7*  HGB 6.9* 7.6*  HCT 19.6* 22.1*  MCV 86.3 87.4  PLT 376 412*     Total time spent including face to face and decision making was greater than 30 minutes  Signed: Norma Montemurro A. 03/01/2016, 2:21 PM

## 2016-03-02 IMAGING — CR DG CHEST 2V
2 series · 2 of 2 positions shown · non-contrast
Comparison: DG CHEST 2 VIEW dated 04/07/2013;

CLINICAL DATA: Bilateral lower rib pain

EXAM:
CHEST  2 VIEW

[w chest pa]
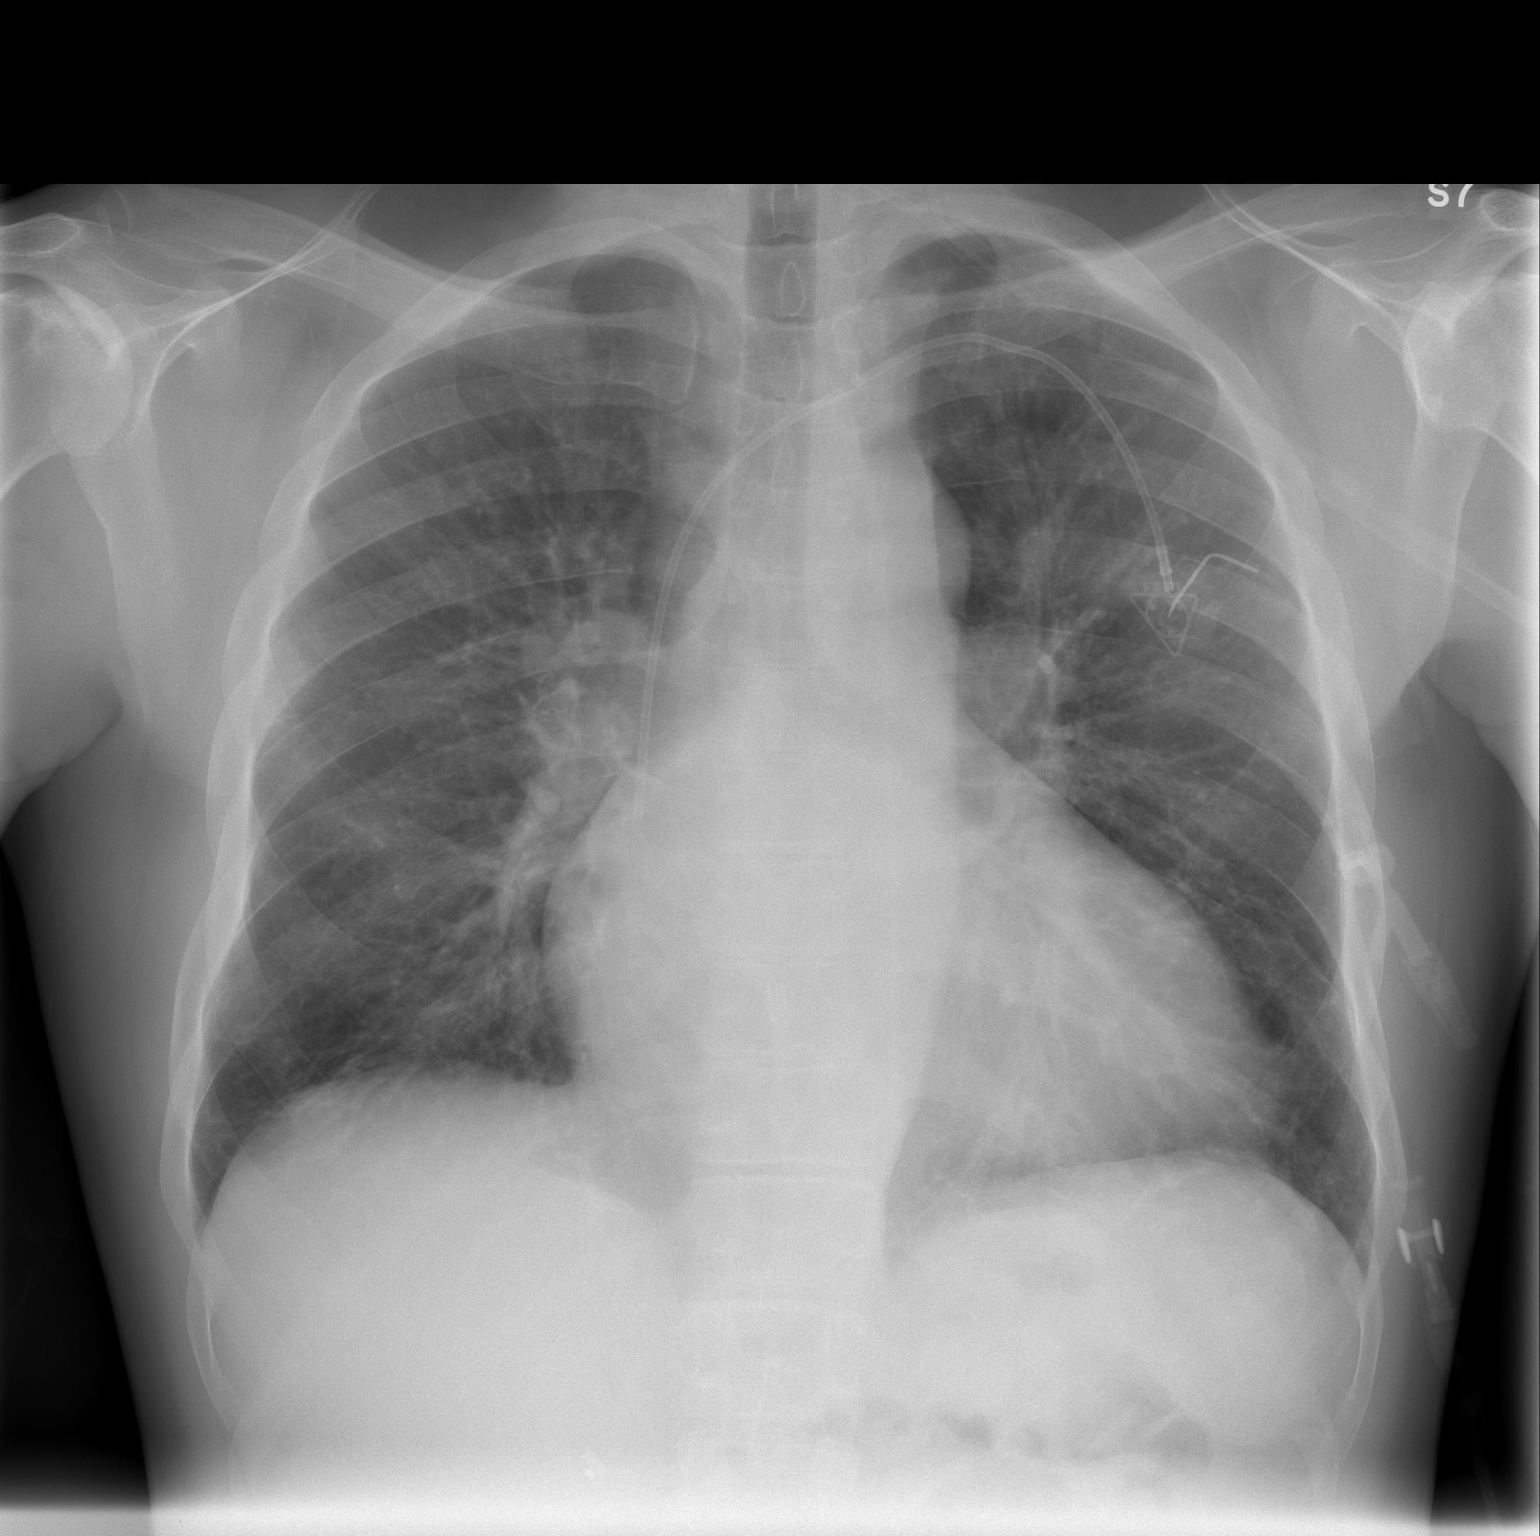

[w chest lat]
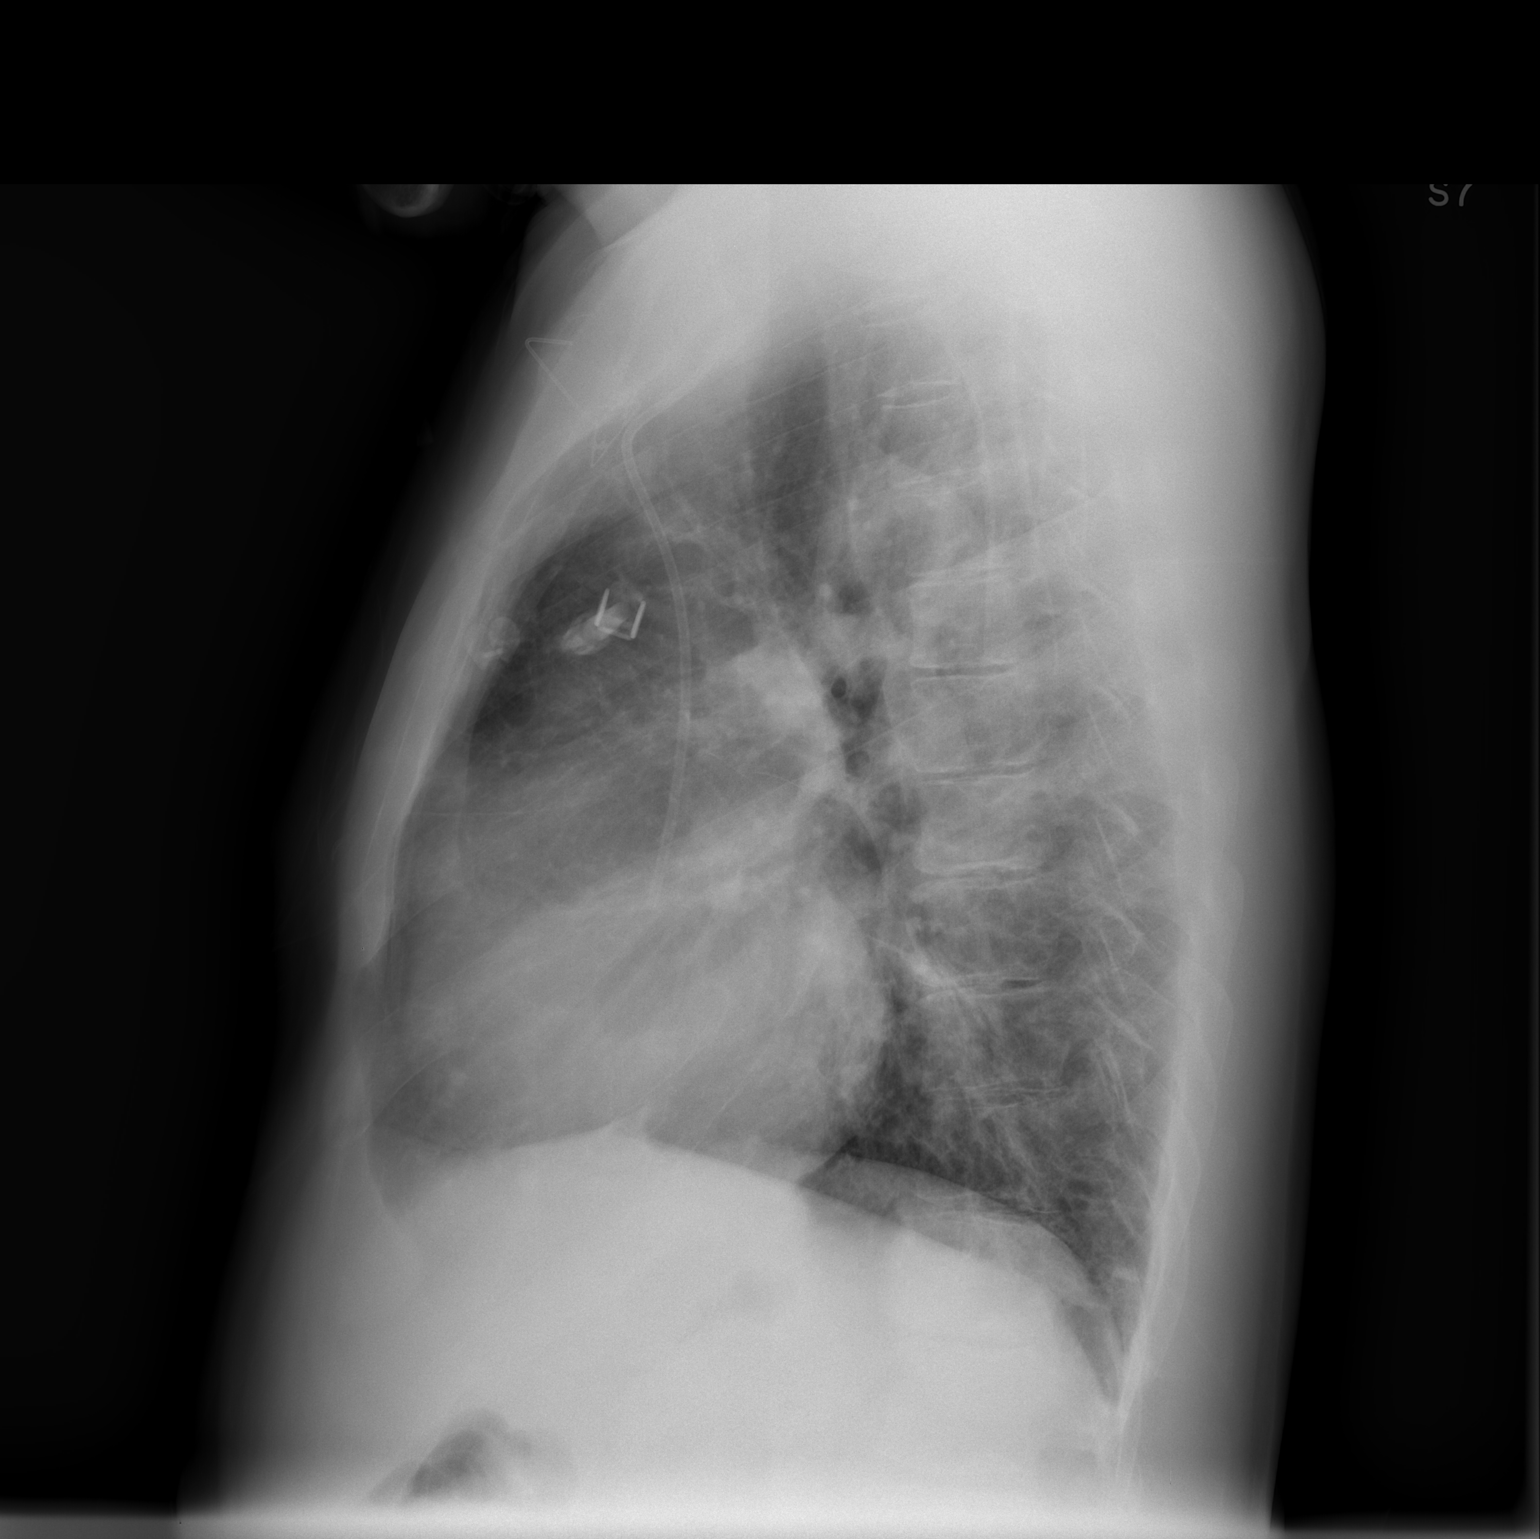

[2 of 2 positions shown; findings below may reference images not displayed]

DG CHEST 2 VIEW dated
03/22/2013; DG CHEST 1V PORT dated 03/28/2013; CT ANGIO CHEST W/CM
&/OR WO/CM dated 03/26/2013
FINDINGS: Grossly unchanged enlarged cardiac silhouette and mediastinal
contours with nodular prominence of the bilateral pulmonary hila
compatible with the mediastinal and hilar adenopathy demonstrated on
recently performed chest CT. The lungs remain hyperexpanded with
flattening of the bilateral hemidiaphragms mild diffuse slightly
nodular thickening of the pulmonary interstitium. Stable positioning
of support apparatus. No new focal airspace opacities. No pleural
effusion or pneumothorax. No definite evidence of edema. Grossly
unchanged bones.
IMPRESSION: 1.  No acute cardiopulmonary disease.
2. Grossly unchanged findings of cardiomegaly and mediastinal/hilar
adenopathy as demonstrated on recently performed chest CT.

## 2016-03-05 ENCOUNTER — Emergency Department (HOSPITAL_COMMUNITY)
Admission: EM | Admit: 2016-03-05 | Discharge: 2016-03-05 | Disposition: A | Discharge status: Home / Self Care | Payer: Medicare Other | Source: Home / Self Care | Attending: Emergency Medicine | Admitting: Emergency Medicine

## 2016-03-05 ENCOUNTER — Encounter (HOSPITAL_COMMUNITY): Payer: Self-pay | Admitting: Emergency Medicine

## 2016-03-05 DIAGNOSIS — E559 Vitamin D deficiency, unspecified: Secondary | ICD-10-CM | POA: Diagnosis not present

## 2016-03-05 DIAGNOSIS — Z87891 Personal history of nicotine dependence: Secondary | ICD-10-CM

## 2016-03-05 DIAGNOSIS — D638 Anemia in other chronic diseases classified elsewhere: Secondary | ICD-10-CM | POA: Diagnosis present

## 2016-03-05 DIAGNOSIS — G8929 Other chronic pain: Secondary | ICD-10-CM | POA: Diagnosis present

## 2016-03-05 DIAGNOSIS — I2721 Secondary pulmonary arterial hypertension: Secondary | ICD-10-CM | POA: Diagnosis present

## 2016-03-05 DIAGNOSIS — I429 Cardiomyopathy, unspecified: Secondary | ICD-10-CM | POA: Diagnosis present

## 2016-03-05 DIAGNOSIS — D72829 Elevated white blood cell count, unspecified: Secondary | ICD-10-CM | POA: Diagnosis not present

## 2016-03-05 DIAGNOSIS — I10 Essential (primary) hypertension: Secondary | ICD-10-CM | POA: Diagnosis present

## 2016-03-05 DIAGNOSIS — Z7901 Long term (current) use of anticoagulants: Secondary | ICD-10-CM

## 2016-03-05 DIAGNOSIS — I110 Hypertensive heart disease with heart failure: Secondary | ICD-10-CM | POA: Diagnosis present

## 2016-03-05 DIAGNOSIS — R7989 Other specified abnormal findings of blood chemistry: Secondary | ICD-10-CM | POA: Diagnosis not present

## 2016-03-05 DIAGNOSIS — Z8673 Personal history of transient ischemic attack (TIA), and cerebral infarction without residual deficits: Secondary | ICD-10-CM | POA: Insufficient documentation

## 2016-03-05 DIAGNOSIS — D5700 Hb-SS disease with crisis, unspecified: Secondary | ICD-10-CM | POA: Insufficient documentation

## 2016-03-05 DIAGNOSIS — Z9981 Dependence on supplemental oxygen: Secondary | ICD-10-CM

## 2016-03-05 DIAGNOSIS — Z7982 Long term (current) use of aspirin: Secondary | ICD-10-CM

## 2016-03-05 DIAGNOSIS — Z96641 Presence of right artificial hip joint: Secondary | ICD-10-CM | POA: Insufficient documentation

## 2016-03-05 DIAGNOSIS — R079 Chest pain, unspecified: Secondary | ICD-10-CM | POA: Diagnosis not present

## 2016-03-05 DIAGNOSIS — D57819 Other sickle-cell disorders with crisis, unspecified: Secondary | ICD-10-CM | POA: Diagnosis not present

## 2016-03-05 DIAGNOSIS — Z9114 Patient's other noncompliance with medication regimen: Secondary | ICD-10-CM

## 2016-03-05 DIAGNOSIS — I2781 Cor pulmonale (chronic): Secondary | ICD-10-CM | POA: Diagnosis not present

## 2016-03-05 DIAGNOSIS — J9621 Acute and chronic respiratory failure with hypoxia: Secondary | ICD-10-CM | POA: Diagnosis present

## 2016-03-05 DIAGNOSIS — I509 Heart failure, unspecified: Secondary | ICD-10-CM | POA: Diagnosis present

## 2016-03-05 DIAGNOSIS — I2782 Chronic pulmonary embolism: Secondary | ICD-10-CM | POA: Diagnosis not present

## 2016-03-05 DIAGNOSIS — Z79899 Other long term (current) drug therapy: Secondary | ICD-10-CM

## 2016-03-05 LAB — CBC WITH DIFFERENTIAL/PLATELET
BAND NEUTROPHILS: 0 %
BASOS ABS: 0 10*3/uL (ref 0.0–0.1)
BASOS PCT: 0 %
Blasts: 0 %
EOS ABS: 0.4 10*3/uL (ref 0.0–0.7)
Eosinophils Relative: 2 %
HCT: 22.4 % — ABNORMAL LOW (ref 39.0–52.0)
HEMOGLOBIN: 7.4 g/dL — AB (ref 13.0–17.0)
Lymphocytes Relative: 13 %
Lymphs Abs: 2.6 10*3/uL (ref 0.7–4.0)
MCH: 29.2 pg (ref 26.0–34.0)
MCHC: 33 g/dL (ref 30.0–36.0)
MCV: 88.5 fL (ref 78.0–100.0)
METAMYELOCYTES PCT: 0 %
MONO ABS: 2.6 10*3/uL — AB (ref 0.1–1.0)
MYELOCYTES: 0 %
Monocytes Relative: 13 %
NEUTROS PCT: 72 %
Neutro Abs: 14.4 10*3/uL — ABNORMAL HIGH (ref 1.7–7.7)
Other: 0 %
Platelets: 619 10*3/uL — ABNORMAL HIGH (ref 150–400)
Promyelocytes Absolute: 0 %
RBC: 2.53 MIL/uL — ABNORMAL LOW (ref 4.22–5.81)
RDW: 20.1 % — ABNORMAL HIGH (ref 11.5–15.5)
WBC: 20 10*3/uL — ABNORMAL HIGH (ref 4.0–10.5)
nRBC: 4 /100 WBC — ABNORMAL HIGH

## 2016-03-05 LAB — COMPREHENSIVE METABOLIC PANEL
ALK PHOS: 99 U/L (ref 38–126)
ALT: 25 U/L (ref 17–63)
AST: 36 U/L (ref 15–41)
Albumin: 4.3 g/dL (ref 3.5–5.0)
Anion gap: 8 (ref 5–15)
BUN: 9 mg/dL (ref 6–20)
CALCIUM: 9.4 mg/dL (ref 8.9–10.3)
CO2: 24 mmol/L (ref 22–32)
CREATININE: 0.85 mg/dL (ref 0.61–1.24)
Chloride: 102 mmol/L (ref 101–111)
GFR calc Af Amer: >60 mL/min (ref >60)
GFR calc non Af Amer: >60 mL/min (ref >60)
Glucose, Bld: 101 mg/dL — ABNORMAL HIGH (ref 65–99)
Potassium: 3.4 mmol/L — ABNORMAL LOW (ref 3.5–5.1)
SODIUM: 134 mmol/L — AB (ref 135–145)
Total Bilirubin: 4.2 mg/dL — ABNORMAL HIGH (ref 0.3–1.2)
Total Protein: 8.1 g/dL (ref 6.5–8.1)

## 2016-03-05 LAB — RETICULOCYTES
RBC.: 2.53 MIL/uL — AB (ref 4.22–5.81)
RETIC CT PCT: 9.1 % — AB (ref 0.4–3.1)
Retic Count, Absolute: 230.2 10*3/uL — ABNORMAL HIGH (ref 19.0–186.0)

## 2016-03-05 MED ORDER — HYDROMORPHONE HCL 2 MG/ML IJ SOLN: 2.0000 mg | INTRAMUSCULAR | Status: AC

## 2016-03-05 MED ORDER — HEPARIN SOD (PORK) LOCK FLUSH 100 UNIT/ML IV SOLN
500.0000 [IU] | Freq: Once | INTRAVENOUS | Status: AC
  Administered 2016-03-05: 500 [IU]
  Filled 2016-03-05: qty 5

## 2016-03-05 MED ORDER — DEXTROSE-NACL 5-0.45 % IV SOLN
INTRAVENOUS | Status: DC
  Administered 2016-03-05: 19:00:00 via INTRAVENOUS

## 2016-03-05 MED ORDER — HYDROMORPHONE HCL 2 MG/ML IJ SOLN
2.0000 mg | INTRAMUSCULAR | Status: AC
  Administered 2016-03-05: 2 mg via INTRAVENOUS
  Filled 2016-03-05: qty 1

## 2016-03-05 MED ORDER — DIPHENHYDRAMINE HCL 50 MG/ML IJ SOLN
25.0000 mg | Freq: Once | INTRAMUSCULAR | Status: AC
  Administered 2016-03-05: 25 mg via INTRAVENOUS
  Filled 2016-03-05: qty 1

## 2016-03-05 MED ORDER — ONDANSETRON HCL 4 MG/2ML IJ SOLN
4.0000 mg | INTRAMUSCULAR | Status: DC | PRN
  Administered 2016-03-05: 4 mg via INTRAVENOUS
  Filled 2016-03-05: qty 2

## 2016-03-05 NOTE — Discharge Instructions (Signed)
Please go to your scheduled appointment on Monday at the sickle cell clinic and mentioned today's visit. Continue to drink at least 8 glasses of water a day and use pain medication and oxygen at home as needed. Please make sure to go to sickle cell clinic first if possible before coming here if you get your usual sickle cell pain again.   Get help right away if: You feel dizzy or faint. You have new abdominal pain, especially on the left side near the stomach area. You develop a persistent, often uncomfortable and painful penile erection (priapism). If this is not treated immediately it will lead to impotence. You have numbness your arms or legs or you have a hard time moving them. You have a hard time with speech. You have a fever or persistent symptoms for more than 2-3 days. You have a fever and your symptoms suddenly get worse. You have signs or symptoms of infection. These include: Chills. Abnormal tiredness (lethargy). Irritability. Poor eating. Vomiting. You develop pain that is not helped with medicine. You develop shortness of breath. You have pain in your chest. You are coughing up pus-like or bloody sputum. You develop a stiff neck. Your feet or hands swell or have pain. Your abdomen appears bloated. You develop joint pain.

## 2016-03-05 NOTE — ED Notes (Signed)
Unable to collect labs at this time patient wants to wait until he goes to the back

## 2016-03-05 NOTE — ED Triage Notes (Signed)
Pt complaint of bilateral ribcage pain ongoing since Thursday unrelieved by home medication regimen. Pt verbalizes last pain dose at 1100. Pt denies SOB or CP.

## 2016-03-05 NOTE — ED Provider Notes (Signed)
[facility] DEPT Provider Note   CSN: [id number] Arrival date & time: 03/05/16  1623     History   Chief Complaint Chief Complaint  Patient presents with  . Sickle Cell Pain Crisis  . Ribcage Pain    HPI [name] is a 36 y.o. male well known to the ED with history of sickle cell crisis presents today with lower rib cage pain bilaterally on his sides for the last 2 days. He reports his pain as sharp, throbbing, gradually worsening, constant 8/10 pain. He states this is his usual sickle cell pain and is unchanged. He states he tried his usual pain medication at home, consisting of dilaudid and morphine with minimal relief. He admits to being on oxygen at home as needed. He denies fever, chills, chest pain, SOB, abdominal pain, change in urination, or bowel movements. He reports having an appointment at the sickle cell clinic on Monday.   The history is provided by the patient. No language interpreter was used.    Past Medical History:  Diagnosis Date  . Acute chest syndrome ([place]) 06/18/2013  . Acute embolism and thrombosis of right internal jugular vein ([place])   . Alcohol consumption of one to four drinks per day   . Avascular necrosis (HCC)    Right Hip  . Blood transfusion   . Chronic anticoagulation   . Demand ischemia ([place]) 01/02/2014  . Former smoker   . Functional asplenia   . Hb-SS disease with crisis ([place])   . History of Clostridium difficile infection   . History of pulmonary embolus (PE)   . Hypertension   . Hypokalemia   . Leukocytosis    Chronic  . Mood disorder ([place])   . Noncompliance with medication regimen   . Oxygen deficiency   . Pulmonary hypertension   . Second hand tobacco smoke exposure   . Sickle cell anemia (HCC)   . Sickle-cell crisis with associated acute chest syndrome ([place]) 05/13/2013  . Stroke ([place])   . Thrombocytosis (HCC)    Chronic  . Uses marijuana     Patient Active Problem List   Diagnosis Date Noted  . Slurred speech  11/10/2015  . Ischemic stroke ([place])   . Anemia 01/31/2015  . Chronic anemia   . Hb-SS disease without crisis ([place]) 10/07/2014  . Acute on chronic respiratory failure with hypoxia ([place])   . Abnormal EKG 09/10/2014  . Cor pulmonale, chronic ([place]) 08/25/2014  . Sickle cell anemia ([place]) 06/25/2014  . Anemia of chronic disease 06/25/2014  . PAH (pulmonary artery hypertension) 03/18/2014  . Chronic respiratory failure with hypoxia ([place]) 03/14/2014  . Paralytic strabismus, external ophthalmoplegia   . Chronic pain syndrome 12/12/2013  . Chronic anticoagulation 08/22/2013  . Essential hypertension 08/22/2013  . Pulmonary HTN 06/18/2013  . Functional asplenia   . Vitamin D deficiency 02/13/2013  . Hx of pulmonary embolus 06/29/2012  . Hemochromatosis 12/14/2011  . Avascular necrosis [facility])     Past Surgical History:  Procedure Laterality Date  . CHOLECYSTECTOMY     01/2008  . Excision of left periauricular cyst     10/2009  . Excision of right ear lobe cyst with primary closur     11/2007  . Porta cath placement    . Porta cath removal    . PORTACATH PLACEMENT  01/05/2012   Procedure: INSERTION PORT-A-CATH;  Surgeon: Odis Hollingshead, MD;  Location: [facility];  Service: General;  Laterality: N/A;  ultrasound guiced port a cath insertion with  fluoroscopy  . Right hip replacement     08/2006  . UMBILICAL HERNIA REPAIR     01/2008       Home Medications    Prior to Admission medications   Medication Sig Start Date End Date Taking? Authorizing Provider  ambrisentan (LETAIRIS) 5 MG tablet Take 5 mg by mouth daily.    Yes Historical Provider, MD  aspirin 81 MG chewable tablet Chew 1 tablet (81 mg total) by mouth daily. 07/23/14  Yes [name], MD  cholecalciferol (VITAMIN D) 1000 units tablet Take 2,000 Units by mouth daily.   Yes Historical Provider, MD  Deferasirox (JADENU) 360 MG TABS Take 1,080 mg by mouth daily.    Yes Historical Provider, MD  folic acid (FOLVITE) 1 MG  tablet Take 1 mg by mouth daily.   Yes Historical Provider, MD  gabapentin (NEURONTIN) 300 MG capsule Take 1 capsule (300 mg total) by mouth 3 (three) times daily. 06/01/15  Yes Tresa Garter, MD  hydroxyurea (HYDREA) 500 MG capsule Take 2 capsules (1,000 mg total) by mouth daily. Hold this medication until CBC and reticulocytes rechecked in 1 week and then restart to be determined by Primary Provider.  May take with food to minimize GI side effects. Hold Hydrea until evaluated by Primary Provider or Hematologist. 03/01/16  Yes [name], MD  morphine (MS CONTIN) 30 MG 12 hr tablet Take 1 tablet (30 mg total) by mouth every 12 (twelve) hours. 02/09/16 03/10/16 Yes Tresa Garter, MD  potassium chloride SA (K-DUR,KLOR-CON) 20 MEQ tablet Take 2 tablets (40 mEq total) by mouth daily. 12/23/15  Yes [name], MD  rivaroxaban (XARELTO) 20 MG TABS tablet Take 1 tablet (20 mg total) by mouth at bedtime. 12/29/15  Yes Tresa Garter, MD  zolpidem (AMBIEN) 10 MG tablet Take 1 tablet (10 mg total) by mouth at bedtime as needed for sleep. 02/09/16 03/10/16 Yes Tresa Garter, MD    Family History Family History  Problem Relation Age of Onset  . Sickle cell trait Mother   . Depression Mother   . Diabetes Mother   . Sickle cell trait Father   . Sickle cell trait Brother     Social History Social History  Substance Use Topics  . Smoking status: Former Smoker    Packs/day: 0.50    Years: 10.00    Types: Cigarettes    Quit date: 05/29/2011  . Smokeless tobacco: Never Used  . Alcohol use No     Allergies   Patient has no known allergies.   Review of Systems Review of Systems  Constitutional: Negative for chills and fever.  Respiratory: Negative for cough and shortness of breath.   Cardiovascular: Negative for chest pain.  Gastrointestinal: Negative for abdominal pain, diarrhea, nausea and vomiting.  Genitourinary: Negative for difficulty urinating and dysuria.    Musculoskeletal: Positive for back pain.       Lower rib cage bilaterally.   Skin: Negative for rash and wound.  Neurological: Negative for numbness.  All other systems reviewed and are negative.    Physical Exam Updated Vital Signs BP 112/88   Pulse 84   Temp 98.1 F (36.7 C) (Oral)   Resp 15   Ht 6' (1.829 m)   Wt 79.4 kg   SpO2 99%   BMI 23.73 kg/m   Physical Exam  Constitutional: He is oriented to person, place, and time. He appears well-developed and well-nourished.  Well appearing  HENT:  Head:  Normocephalic and atraumatic.  Nose: Nose normal.  Mouth/Throat: Oropharynx is clear and moist.  Eyes: Conjunctivae and EOM are normal. Pupils are equal, round, and reactive to light.  Neck: Normal range of motion. Neck supple.  Cardiovascular: Normal rate and normal heart sounds.   Pulmonary/Chest: Effort normal and breath sounds normal. No respiratory distress.  Normal work of breathing. No respiratory distress.   Abdominal: Soft. There is no tenderness. There is no rebound and no guarding.  Soft and nontender. No rebound or guarding.   Musculoskeletal: Normal range of motion. He exhibits no tenderness.  Neurological: He is alert and oriented to person, place, and time.  Skin: Skin is warm.  Port at left chest wall. No surrounding erythema. No discharge. Does not look infectious.   Psychiatric: He has a normal mood and affect. His behavior is normal.  Nursing note and vitals reviewed.    ED Treatments / Results  Labs (all labs ordered are listed, but only abnormal results are displayed) Labs Reviewed  COMPREHENSIVE METABOLIC PANEL - Abnormal; Notable for the following:       Result Value   Sodium 134 (*)    Potassium 3.4 (*)    Glucose, Bld 101 (*)    Total Bilirubin 4.2 (*)    All other components within normal limits  CBC WITH DIFFERENTIAL/PLATELET - Abnormal; Notable for the following:    WBC 20.0 (*)    RBC 2.53 (*)    Hemoglobin 7.4 (*)    HCT 22.4 (*)     RDW 20.1 (*)    Platelets 619 (*)    nRBC 4 (*)    Neutro Abs 14.4 (*)    Monocytes Absolute 2.6 (*)    All other components within normal limits  RETICULOCYTES - Abnormal; Notable for the following:    Retic Ct Pct 9.1 (*)    RBC. 2.53 (*)    Retic Count, Manual 230.2 (*)    All other components within normal limits    EKG  EKG Interpretation None       Radiology No results found.  Procedures Procedures (including critical care time)  Medications Ordered in ED Medications  ondansetron (ZOFRAN) injection 4 mg (4 mg Intravenous Given 03/05/16 1837)  dextrose 5 %-0.45 % sodium chloride infusion ( Intravenous Stopped 03/05/16 2107)  HYDROmorphone (DILAUDID) injection 2 mg (2 mg Intravenous Given 03/05/16 1837)    Or  HYDROmorphone (DILAUDID) injection 2 mg ( Subcutaneous See Alternative 03/05/16 1837)  HYDROmorphone (DILAUDID) injection 2 mg (2 mg Intravenous Given 03/05/16 1927)    Or  HYDROmorphone (DILAUDID) injection 2 mg ( Subcutaneous See Alternative 03/05/16 1927)  HYDROmorphone (DILAUDID) injection 2 mg (2 mg Intravenous Given 03/05/16 2001)    Or  HYDROmorphone (DILAUDID) injection 2 mg ( Subcutaneous See Alternative 03/05/16 2001)  diphenhydrAMINE (BENADRYL) injection 25 mg (25 mg Intravenous Given 03/05/16 1837)  diphenhydrAMINE (BENADRYL) injection 25 mg (25 mg Intravenous Given 03/05/16 2011)  heparin lock flush 100 unit/mL (500 Units Intracatheter Given 03/05/16 2059)     Initial Impression / Assessment and Plan / ED Course  I have reviewed the triage vital signs and the nursing notes.  Pertinent labs & imaging results that were available during my care of the patient were reviewed by me and considered in my medical decision making (see chart for details).     Patient well known to the ED presenting with sickle cell crisis. He is complaining of his usual symptoms of lower rib cage pain on his  sides bilaterally. No chest pain, shortness of breath, abdominal pain, fevers,  chills. Pt started on sickle cell protocol. Low suspicion for ACS at this time.   Pt given dilaudid x 3.    20:20 Reassessed.  Pt placed on 3 L of oxygen due to oxygen saturation being mid 80's. Pt however denied any shortness of breath. Patient satting in high 90's after 3 L started. Continues to have no chest pain, shortness of breath, fevers, chills. Patient feeling better and feels he is able to well enough to go home. He states his brother will pick him up.   Patient verbalizes understanding to discharge and agrees with assessment and plan. He is to be discharged and instructed to continue his pain medication and oxygen at home as needed. He is to go to his scheduled appointment at the sickle cell clinic for follow up on today's visit. Return precautions given. Intrusted to return immediately to ED if he experienced any fever, chills, and chest pain.  Patient case discussed with Dr. Laverta Baltimore who agreed with assessment and plan.    Final Clinical Impressions(s) / ED Diagnoses   Final diagnoses:  Sickle cell pain crisis [facility])    New Prescriptions Discharge Medication List as of 03/05/2016  8:38 PM       [place], [place] 03/05/16 [place], [place] 03/06/16 [phone]

## 2016-03-06 ENCOUNTER — Encounter (HOSPITAL_COMMUNITY): Payer: Self-pay | Admitting: Emergency Medicine

## 2016-03-06 ENCOUNTER — Inpatient Hospital Stay (HOSPITAL_COMMUNITY)
Admission: EM | Admit: 2016-03-06 | Discharge: 2016-03-09 | DRG: 811 | Disposition: A | Discharge status: Home / Self Care | Payer: Medicare Other | Attending: Internal Medicine | Admitting: Internal Medicine

## 2016-03-06 ENCOUNTER — Emergency Department (HOSPITAL_COMMUNITY): Payer: Medicare Other

## 2016-03-06 DIAGNOSIS — D57819 Other sickle-cell disorders with crisis, unspecified: Secondary | ICD-10-CM | POA: Diagnosis not present

## 2016-03-06 DIAGNOSIS — D5700 Hb-SS disease with crisis, unspecified: Secondary | ICD-10-CM

## 2016-03-06 DIAGNOSIS — R0902 Hypoxemia: Secondary | ICD-10-CM | POA: Diagnosis not present

## 2016-03-06 DIAGNOSIS — R079 Chest pain, unspecified: Secondary | ICD-10-CM | POA: Diagnosis not present

## 2016-03-06 LAB — CBC WITH DIFFERENTIAL/PLATELET
Basophils Absolute: 0.2 10*3/uL — ABNORMAL HIGH (ref 0.0–0.1)
Basophils Relative: 1 %
EOS ABS: 0.4 10*3/uL (ref 0.0–0.7)
EOS PCT: 2 %
HEMATOCRIT: 22.9 % — AB (ref 39.0–52.0)
HEMOGLOBIN: 7.8 g/dL — AB (ref 13.0–17.0)
LYMPHS PCT: 10 %
Lymphs Abs: 2.2 10*3/uL (ref 0.7–4.0)
MCH: 30.6 pg (ref 26.0–34.0)
MCHC: 34.1 g/dL (ref 30.0–36.0)
MCV: 89.8 fL (ref 78.0–100.0)
Monocytes Absolute: 2.6 10*3/uL — ABNORMAL HIGH (ref 0.1–1.0)
Monocytes Relative: 12 %
NEUTROS ABS: 16.2 10*3/uL — AB (ref 1.7–7.7)
Neutrophils Relative %: 75 %
PLATELETS: 641 10*3/uL — AB (ref 150–400)
RBC: 2.55 MIL/uL — ABNORMAL LOW (ref 4.22–5.81)
RDW: 20.7 % — ABNORMAL HIGH (ref 11.5–15.5)
WBC MORPHOLOGY: INCREASED
WBC: 21.6 10*3/uL — ABNORMAL HIGH (ref 4.0–10.5)

## 2016-03-06 LAB — RETICULOCYTES
RBC.: 2.55 MIL/uL — ABNORMAL LOW (ref 4.22–5.81)
Retic Count, Absolute: 323.9 10*3/uL — ABNORMAL HIGH (ref 19.0–186.0)
Retic Ct Pct: 12.7 % — ABNORMAL HIGH (ref 0.4–3.1)

## 2016-03-06 LAB — COMPREHENSIVE METABOLIC PANEL
ALBUMIN: 4.5 g/dL (ref 3.5–5.0)
ALK PHOS: 110 U/L (ref 38–126)
ALT: 23 U/L (ref 17–63)
ANION GAP: 8 (ref 5–15)
AST: 33 U/L (ref 15–41)
BUN: 12 mg/dL (ref 6–20)
CALCIUM: 9.3 mg/dL (ref 8.9–10.3)
CO2: 24 mmol/L (ref 22–32)
CREATININE: 1.12 mg/dL (ref 0.61–1.24)
Chloride: 104 mmol/L (ref 101–111)
GFR calc Af Amer: >60 mL/min (ref >60)
GFR calc non Af Amer: >60 mL/min (ref >60)
GLUCOSE: 113 mg/dL — AB (ref 65–99)
Potassium: 3.7 mmol/L (ref 3.5–5.1)
SODIUM: 136 mmol/L (ref 135–145)
Total Bilirubin: 4.2 mg/dL — ABNORMAL HIGH (ref 0.3–1.2)
Total Protein: 8 g/dL (ref 6.5–8.1)

## 2016-03-06 MED ORDER — HYDROXYUREA 500 MG PO CAPS
1000.0000 mg | ORAL_CAPSULE | Freq: Every day | ORAL | Status: DC
  Administered 2016-03-06 – 2016-03-09 (×3): 1000 mg via ORAL
  Filled 2016-03-06 (×4): qty 2

## 2016-03-06 MED ORDER — RIVAROXABAN 20 MG PO TABS
20.0000 mg | ORAL_TABLET | Freq: Every day | ORAL | Status: DC
  Administered 2016-03-06 – 2016-03-08 (×3): 20 mg via ORAL
  Filled 2016-03-06 (×3): qty 1

## 2016-03-06 MED ORDER — GABAPENTIN 300 MG PO CAPS
300.0000 mg | ORAL_CAPSULE | Freq: Three times a day (TID) | ORAL | Status: DC
Start: 2016-03-06 — End: 2016-03-09
  Administered 2016-03-06 – 2016-03-09 (×8): 300 mg via ORAL
  Filled 2016-03-06 (×8): qty 1

## 2016-03-06 MED ORDER — ASPIRIN 81 MG PO CHEW
81.0000 mg | CHEWABLE_TABLET | Freq: Every day | ORAL | Status: DC
  Administered 2016-03-06 – 2016-03-09 (×4): 81 mg via ORAL
  Filled 2016-03-06 (×4): qty 1

## 2016-03-06 MED ORDER — AMBRISENTAN 5 MG PO TABS
5.0000 mg | ORAL_TABLET | Freq: Every day | ORAL | Status: DC
Start: 2016-03-06 — End: 2016-03-09
  Administered 2016-03-08 – 2016-03-09 (×2): 5 mg via ORAL

## 2016-03-06 MED ORDER — PROMETHAZINE HCL 25 MG PO TABS: 25.0000 mg | ORAL_TABLET | Freq: Four times a day (QID) | ORAL | Status: DC | PRN

## 2016-03-06 MED ORDER — SODIUM CHLORIDE 0.45 % IV SOLN
INTRAVENOUS | Status: DC
  Administered 2016-03-06: 09:00:00 via INTRAVENOUS

## 2016-03-06 MED ORDER — SODIUM CHLORIDE 0.9 % IJ SOLN
INTRAMUSCULAR | Status: AC
  Filled 2016-03-06: qty 50

## 2016-03-06 MED ORDER — KETOROLAC TROMETHAMINE 15 MG/ML IJ SOLN
15.0000 mg | INTRAMUSCULAR | Status: AC
  Administered 2016-03-06: 15 mg via INTRAVENOUS
  Filled 2016-03-06: qty 1

## 2016-03-06 MED ORDER — HYDROMORPHONE HCL 2 MG/ML IJ SOLN
2.0000 mg | INTRAMUSCULAR | Status: AC
  Administered 2016-03-06: 2 mg via INTRAVENOUS
  Filled 2016-03-06: qty 1

## 2016-03-06 MED ORDER — NALOXONE HCL 0.4 MG/ML IJ SOLN: 0.4000 mg | INTRAMUSCULAR | Status: DC | PRN

## 2016-03-06 MED ORDER — ZOLPIDEM TARTRATE 10 MG PO TABS
10.0000 mg | ORAL_TABLET | Freq: Every evening | ORAL | Status: DC | PRN
  Administered 2016-03-06 – 2016-03-08 (×3): 10 mg via ORAL
  Filled 2016-03-06 (×3): qty 1

## 2016-03-06 MED ORDER — SODIUM CHLORIDE 0.45 % IV SOLN
INTRAVENOUS | Status: DC
  Administered 2016-03-06 – 2016-03-07 (×2): via INTRAVENOUS
  Administered 2016-03-07: 1000 mL via INTRAVENOUS

## 2016-03-06 MED ORDER — HYDROMORPHONE HCL 2 MG/ML IJ SOLN
2.0000 mg | INTRAMUSCULAR | Status: DC | PRN
Start: 2016-03-06 — End: 2016-03-07
  Administered 2016-03-06 – 2016-03-07 (×8): 2 mg via INTRAVENOUS
  Filled 2016-03-06 (×8): qty 1

## 2016-03-06 MED ORDER — HYDROMORPHONE HCL 2 MG/ML IJ SOLN: 2.0000 mg | INTRAMUSCULAR | Status: DC

## 2016-03-06 MED ORDER — HYDROMORPHONE 1 MG/ML IV SOLN
INTRAVENOUS | Status: DC
  Administered 2016-03-06: 1 mg via INTRAVENOUS
  Administered 2016-03-06: 25 mg via INTRAVENOUS
  Administered 2016-03-06: 1 mg via INTRAVENOUS
  Administered 2016-03-07: 25 mg via INTRAVENOUS
  Administered 2016-03-07: 1 mg via INTRAVENOUS
  Administered 2016-03-07: 5.6 mg via INTRAVENOUS
  Administered 2016-03-07: 1 mg via INTRAVENOUS
  Filled 2016-03-06 (×3): qty 25

## 2016-03-06 MED ORDER — FOLIC ACID 1 MG PO TABS
1.0000 mg | ORAL_TABLET | Freq: Every day | ORAL | Status: DC
  Administered 2016-03-06 – 2016-03-09 (×4): 1 mg via ORAL
  Filled 2016-03-06 (×4): qty 1

## 2016-03-06 MED ORDER — MORPHINE SULFATE ER 30 MG PO TBCR
30.0000 mg | EXTENDED_RELEASE_TABLET | Freq: Two times a day (BID) | ORAL | Status: DC
  Administered 2016-03-06 – 2016-03-09 (×6): 30 mg via ORAL
  Filled 2016-03-06 (×6): qty 1

## 2016-03-06 MED ORDER — VITAMIN D3 25 MCG (1000 UNIT) PO TABS
2000.0000 [IU] | ORAL_TABLET | Freq: Every day | ORAL | Status: DC
  Administered 2016-03-06 – 2016-03-09 (×4): 2000 [IU] via ORAL
  Filled 2016-03-06 (×5): qty 2

## 2016-03-06 MED ORDER — DIPHENHYDRAMINE HCL 25 MG PO CAPS: 25.0000 mg | ORAL_CAPSULE | ORAL | Status: DC | PRN

## 2016-03-06 MED ORDER — HEPARIN SOD (PORK) LOCK FLUSH 100 UNIT/ML IV SOLN: 500.0000 [IU] | Freq: Once | INTRAVENOUS | Status: DC

## 2016-03-06 MED ORDER — SODIUM CHLORIDE 0.9% FLUSH: 9.0000 mL | INTRAVENOUS | Status: DC | PRN

## 2016-03-06 MED ORDER — IOPAMIDOL (ISOVUE-370) INJECTION 76%
INTRAVENOUS | Status: AC
  Filled 2016-03-06: qty 100

## 2016-03-06 MED ORDER — DIPHENHYDRAMINE HCL 50 MG/ML IJ SOLN
25.0000 mg | Freq: Once | INTRAMUSCULAR | Status: AC
  Administered 2016-03-06: 25 mg via INTRAVENOUS
  Filled 2016-03-06: qty 1

## 2016-03-06 MED ORDER — HYDROMORPHONE HCL 2 MG/ML IJ SOLN: 2.0000 mg | INTRAMUSCULAR | Status: AC

## 2016-03-06 MED ORDER — PROMETHAZINE HCL 25 MG PO TABS: 25.0000 mg | ORAL_TABLET | ORAL | Status: DC | PRN

## 2016-03-06 MED ORDER — SODIUM CHLORIDE 0.9 % IV SOLN
25.0000 mg | INTRAVENOUS | Status: DC | PRN
  Filled 2016-03-06: qty 0.5

## 2016-03-06 MED ORDER — HYDROMORPHONE HCL 2 MG/ML IJ SOLN
2.0000 mg | INTRAMUSCULAR | Status: DC
  Administered 2016-03-06 (×3): 2 mg via INTRAVENOUS
  Filled 2016-03-06 (×3): qty 1

## 2016-03-06 MED ORDER — IOPAMIDOL (ISOVUE-370) INJECTION 76%
INTRAVENOUS | Status: AC
  Administered 2016-03-06: 100 mL
  Filled 2016-03-06: qty 100

## 2016-03-06 MED ORDER — DEFERASIROX 360 MG PO TABS: 1080.0000 mg | ORAL_TABLET | Freq: Every day | ORAL | Status: DC

## 2016-03-06 MED ORDER — ONDANSETRON HCL 4 MG/2ML IJ SOLN: 4.0000 mg | Freq: Four times a day (QID) | INTRAMUSCULAR | Status: DC | PRN

## 2016-03-06 MED ORDER — KETOROLAC TROMETHAMINE 30 MG/ML IJ SOLN
30.0000 mg | Freq: Four times a day (QID) | INTRAMUSCULAR | Status: DC | PRN
  Administered 2016-03-07 – 2016-03-09 (×4): 30 mg via INTRAVENOUS
  Filled 2016-03-06 (×4): qty 1

## 2016-03-06 NOTE — Progress Notes (Signed)
Level 8 pain for patient reported bilateral rib pain. Given Dilaudid 2 mg IV as ordered.

## 2016-03-06 NOTE — ED Notes (Signed)
Pt was found to have O2 of 78% on 5L. Pt placed on NRB and Dr Tomi Bamberger notified

## 2016-03-06 NOTE — ED Notes (Signed)
Patient transported to CT 

## 2016-03-06 NOTE — H&P (Signed)
H&P   Patient Demographics:    [name], is a 36 y.o. male  MRN: [id number]   DOB - 1980/06/23  Admit Date - 03/06/2016  Outpatient Primary MD for the patient is [name], MD  Chief Complaint  Patient presents with  . right rib cage pain     HPI:    [name]  is a 36 y.o. male with history of sickle cell disease with frequent crises and multiple complications as listed below, came to the ED today with complaints of bilateral rib pain that he associates with a sickle cell crisis. He started having pain in his rib cage bilaterally about 2 days ago. The pain is sharp, throbbing and constant in nature. He denies any trouble with cough or shortness of breath. At baseline his oxygen saturation is in the low 90s. He does have supplemental oxygen to use at home but not adherent. He was also in the ED yesterday for similar complaint but was able to maintain his saturation throughout ED stay. However today, while he was being evaluated for discharge from the ED, his pulse oxy was noted to be in the 60s and only improved to upper 80s and low 90s with non-rebreather oxygen. He has had multiple complications including Acute Chest Syndrome and Pulmonary Embolism.   Patient is being admitted for Hypoxia and Sickle Cell Pain Crisis.   Review of systems:    In addition to the HPI above, patient reports No Fever-chills No Headache, No changes with Vision or hearing No problems swallowing food or Liquids No Chest pain, Cough or Shortness of Breath No Abdominal pain, No Nausea or Vomiting, Bowel movements are regular No Blood in stool or Urine No dysuria No new skin rashes or bruises No new joints pains-aches No new weakness, tingling, numbness in any extremity No recent weight gain or loss No polyuria, polydypsia or polyphagia No significant Mental Stressors.  A full 10 point Review of Systems was done, except as stated above, all other Review of Systems were negative.  With  Past History of the following :   Past Medical History:  Diagnosis Date  . Acute chest syndrome ([place]) 06/18/2013  . Acute embolism and thrombosis of right internal jugular vein ([place])   . Alcohol consumption of one to four drinks per day   . Avascular necrosis ([facility])    Right Hip  . Blood transfusion   . Chronic anticoagulation   . Demand ischemia ([place]) 01/02/2014  . Former smoker   . Functional asplenia   . Hb-SS disease with crisis ([place])   . History of Clostridium difficile infection   . History of pulmonary embolus (PE)   . Hypertension   . Hypokalemia   . Leukocytosis    Chronic  . Mood disorder ([place])   . Noncompliance with medication regimen   . Oxygen deficiency   . Pulmonary hypertension   . Second hand tobacco smoke exposure   . Sickle cell anemia (HCC)   . Sickle-cell crisis with associated acute chest syndrome ([place]) 05/13/2013  . Stroke ([place])   . Thrombocytosis (H[facility])    Chronic  . Uses marijuana      Past Surgical History:  Procedure Laterality Date  . CHOLECYSTECTOMY     01/2008  . Excision of left periauricular cyst     10/2009  . Excision of right ear lobe cyst with primary closur     11/2007  . Porta cath placement    . Porta cath removal    .  PORTACATH PLACEMENT  01/05/2012   Procedure: INSERTION PORT-A-CATH;  Surgeon: Odis Hollingshead, MD;  Location: [employer];  Service: General;  Laterality: N/A;  ultrasound guiced port a cath insertion with fluoroscopy  . Right hip replacement     08/2006  . UMBILICAL HERNIA REPAIR     01/2008    Social History:   Social History  Substance Use Topics  . Smoking status: Former Smoker    Packs/day: 0.50    Years: 10.00    Types: Cigarettes    Quit date: 05/29/2011  . Smokeless tobacco: Never Used  . Alcohol use No    Lives - At home   Family History :   Family History  Problem Relation Age of Onset  . Sickle cell trait Mother   . Depression Mother   . Diabetes Mother   . Sickle cell trait Father   . Sickle  cell trait Brother      Home Medications:   Prior to Admission medications   Medication Sig Start Date End Date Taking? Authorizing Provider  ambrisentan (LETAIRIS) 5 MG tablet Take 5 mg by mouth daily.    Yes Historical Provider, MD  aspirin 81 MG chewable tablet Chew 1 tablet (81 mg total) by mouth daily. 07/23/14  Yes [name], MD  cholecalciferol (VITAMIN D) 1000 units tablet Take 2,000 Units by mouth daily.   Yes Historical Provider, MD  Deferasirox (JADENU) 360 MG TABS Take 1,080 mg by mouth daily.    Yes Historical Provider, MD  folic acid (FOLVITE) 1 MG tablet Take 1 mg by mouth daily.   Yes Historical Provider, MD  gabapentin (NEURONTIN) 300 MG capsule Take 1 capsule (300 mg total) by mouth 3 (three) times daily. 06/01/15  Yes Tresa Garter, MD  hydroxyurea (HYDREA) 500 MG capsule Take 2 capsules (1,000 mg total) by mouth daily. Hold this medication until CBC and reticulocytes rechecked in 1 week and then restart to be determined by Primary Provider.  May take with food to minimize GI side effects. Hold Hydrea until evaluated by Primary Provider or Hematologist. 03/01/16  Yes [name], MD  morphine (MS CONTIN) 30 MG 12 hr tablet Take 1 tablet (30 mg total) by mouth every 12 (twelve) hours. 02/09/16 03/10/16 Yes Tresa Garter, MD  potassium chloride SA (K-DUR,KLOR-CON) 20 MEQ tablet Take 2 tablets (40 mEq total) by mouth daily. 12/23/15  Yes [name], MD  rivaroxaban (XARELTO) 20 MG TABS tablet Take 1 tablet (20 mg total) by mouth at bedtime. 12/29/15  Yes Tresa Garter, MD  zolpidem (AMBIEN) 10 MG tablet Take 1 tablet (10 mg total) by mouth at bedtime as needed for sleep. 02/09/16 03/10/16 Yes Tresa Garter, MD     Allergies:    No Known Allergies   Physical Exam:  Vitals:  Vitals:   03/06/16 1503 03/06/16 1510  BP: 106/63   Pulse: 98 94  Resp: 16   Temp:     Physical Exam: Constitutional: Patient appears well-developed and  well-nourished but older than his age. Not in obvious distress. HENT: Normocephalic, atraumatic, External right and left ear normal. Oropharynx is clear and moist.  Eyes: Conjunctivae and EOM are normal. PERRLA, no scleral icterus. Neck: Normal ROM. Neck supple. No JVD. No tracheal deviation. No thyromegaly. CVS: RRR, Loud P2, no murmurs, no gallops, no carotid bruit.  Pulmonary: Effort and breath sounds normal, no stridor, rhonchi, wheezes, rales.  Abdominal: Soft. BS +, no distension, tenderness, rebound or  guarding.  Musculoskeletal: Normal range of motion. No edema and no tenderness.  Lymphadenopathy: No lymphadenopathy noted, cervical, inguinal or axillary Neuro: Alert. Normal reflexes, muscle tone coordination. No cranial nerve deficit. Skin: Skin is warm and dry. No rash noted. Not diaphoretic. No erythema. No pallor. Psychiatric: Normal mood and affect. Behavior, judgment, thought content normal.   Data Review:    CBC  Recent Labs Lab 02/29/16 0818 03/01/16 1102 03/05/16 1837 03/06/16 0846  WBC 15.7* 12.9* 20.0* 21.6*  HGB 6.9* 7.6* 7.4* 7.8*  HCT 19.6* 22.1* 22.4* 22.9*  PLT 376 412* 619* 641*  MCV 86.3 87.4 88.5 89.8  MCH 30.4 30.0 29.2 30.6  MCHC 35.2 34.4 33.0 34.1  RDW 18.7* 18.9* 20.1* 20.7*  LYMPHSABS 1.6 2.1 2.6 2.2  MONOABS 2.7* 1.4* 2.6* 2.6*  EOSABS 0.7 0.6 0.4 0.4  BASOSABS 0.0 0.1 0.0 0.2*   ----------------------------------------------------------------------------------------------------------------- Chemistries   Recent Labs Lab 02/29/16 0818 03/01/16 1102 03/05/16 1837 03/06/16 0846  NA 138 137 134* 136  K 4.3 4.1 3.4* 3.7  CL 106 105 102 104  CO2 27 26 24 24   GLUCOSE 112* 148* 101* 113*  BUN 20 19 9 12   CREATININE 0.94 0.87 0.85 1.12  CALCIUM 8.8* 9.0 9.4 9.3  AST  --   --  36 33  ALT  --   --  25 23  ALKPHOS  --   --  99 110  BILITOT  --   --  4.2* 4.2*   ----------------------------------------------------------------------------------------------------------------- estimated creatinine clearance is 100.1 mL/min (by C-G formula based on SCr of 1.12 mg/dL). ------------------------------------------------------------------------------------------------------------------ No results for input(s): TSH, T4TOTAL, T3FREE, THYROIDAB in the last 72 hours.  Invalid input(s): FREET3  Coagulation profile No results for input(s): INR, PROTIME in the last 168 hours. ------------------------------------------------------------------------------------------------------------------- No results for input(s): DDIMER in the last 72 hours. ------------------------------------------------------------------------------------------------------------------- Cardiac Enzymes No results for input(s): CKMB, TROPONINI, MYOGLOBIN in the last 168 hours.  Invalid input(s): CK ------------------------------------------------------------------------------------------------------------------    Component Value Date/Time   BNP 381.0 (H) 02/11/2016 1006   --------------------------------------------------------------------------------------------------------------- Urinalysis    Component Value Date/Time   COLORURINE AMBER (A) 02/12/2016 0926   APPEARANCEUR HAZY (A) 02/12/2016 0926   LABSPEC 1.015 02/12/2016 0926   PHURINE 5.0 02/12/2016 0926   GLUCOSEU NEGATIVE 02/12/2016 0926   HGBUR MODERATE (A) 02/12/2016 0926   BILIRUBINUR NEGATIVE 02/12/2016 0926   KETONESUR NEGATIVE 02/12/2016 0926   PROTEINUR 30 (A) 02/12/2016 0926   UROBILINOGEN 0.2 03/24/2015 1426   NITRITE NEGATIVE 02/12/2016 0926   LEUKOCYTESUR NEGATIVE 02/12/2016 0926  -------------------------------------------------------------------------------------------------------------   Imaging Results:    Dg Chest 2 View  Result Date: 03/06/2016 CLINICAL DATA:  36 year old male with right rib pain and  sickle cell crisis. Initial encounter. EXAM: CHEST  2 VIEW COMPARISON:  02/29/2016 and earlier. FINDINGS: Stable cardiomegaly and mediastinal contours. Stable left chest power port, currently accessed. Interval resolved patchy and nodular right upper lung opacity. No pneumothorax, pulmonary edema, pleural effusion or acute pulmonary opacity. Stable basilar predominant mild chronic increased interstitial markings. Negative visible bowel gas pattern. Stable cholecystectomy clips. No acute osseous abnormality identified. IMPRESSION: Interval resolved right upper lobe opacity. Stable cardiomegaly with no acute cardiopulmonary abnormality. Electronically Signed   By: Genevie Ann M.D.   On: 03/06/2016 10:46   Ct Angio Chest Pe W And/or Wo Contrast  Result Date: 03/06/2016 CLINICAL DATA:  Chest pain, new hypoxia.  Sickle cell disease. EXAM: CT ANGIOGRAPHY CHEST WITH CONTRAST TECHNIQUE: Multidetector CT imaging of the chest was performed using the  standard protocol during bolus administration of intravenous contrast. Multiplanar CT image reconstructions and MIPs were obtained to evaluate the vascular anatomy. CONTRAST:  100 cc Isovue 370 Additional 80 cc Isovue 370 given due to port leakage and patient motion. COMPARISON:  Chest CT angiogram dated 02/21/2016. FINDINGS: Cardiovascular: Despite repeat imaging, the peripheral segmental and subsegmental pulmonary arteries to the lower lobes are difficult to definitively characterize due to patient motion artifact and suboptimal contrast opacification. There is, however, no pulmonary embolism identified within the main, lobar or central segmental pulmonary arteries bilaterally. Patient has had chronic lower lobe subsegmental pulmonary emboli described on previous exams, not able to be delineated again on today's study due to the patient motion artifact and suboptimal contrast opacification. Thoracic aorta is normal in caliber and configuration. Heart is prominently enlarged,  stable compared to the previous exam. No pericardial effusion seen. Mediastinum/Nodes: Numerous small and moderate-sized lymph nodes within the anterior mediastinum are stable. Normal residual thymic tissue is also noted within the anterior mediastinum. Esophagus appears normal. Trachea and central bronchi are unremarkable. Lungs/Pleura: Mild bibasilar scarring/atelectasis is stable. Lungs otherwise clear. No evidence of pneumonia. No pleural effusion or pneumothorax. Upper Abdomen: Limited images of the upper abdomen are unremarkable. Musculoskeletal: No acute or suspicious osseous lesion. Superficial soft tissues are unremarkable. Review of the MIP images confirms the above findings. IMPRESSION: 1. No acute pulmonary embolus identified, with mild study limitations detailed above. 2. Stable cardiomegaly. 3. No pneumonia or pulmonary edema. Stable mild scarring/fibrosis at the lung bases. 4. Stable mediastinal lymphadenopathy. Electronically Signed   By: Franki Cabot M.D.   On: 03/06/2016 13:30    Assessment & Plan:    Active Problems:   Hypoxia  1. Hypoxia: Patient has baseline hypoxia, has been prescribed continuous oxygen use at home but patient is not adherent. Oxygen level went down to 60% on slight activity. CTA is negative for PE and no acute pulm process. Patient will be placed on non-re breather overnight, monitor pulse oxy closely.  2. Hb SS with crisis: Start patient on Dilaudid via PCA, start IV Toradol, IV Dilaudid 2 mg every 2 hours as needed, IVF and other adjunct therapies. 3. Leukocytosis: No evidence of infection, Will monitor.  4. Anemia of Chronic Disease: Hb at baseline. Will monitor 5. Chronic pain: Continue MS Contin.  6. Chronic Anticoagulation: Continue Xarelto   DVT Prophylaxis: On Xarelto, continue  AM Labs Ordered, also please review Full Orders  Family Communication: Admission, patient's condition and plan of care including tests being ordered have been discussed with  the patient who indicate understanding and agree with the plan and Code Status.  Code Status: Full Code  Consults called: None    Admission status: Inpatient    Time spent in minutes : 50 minutes  Aluel Schwarz MD, MHA, CPE, FACP 03/06/2016 at 4:07 PM

## 2016-03-06 NOTE — Discharge Instructions (Signed)
Please follow up in the sickle cell clinic tomorrow as planned. Return to the emergency room for fever, shortness of breath, worsening symptoms

## 2016-03-06 NOTE — ED Notes (Signed)
Patient oxygen sat drop to 83% RA. Pt place on 2 L of oxygen now. Will continue to monitor.

## 2016-03-06 NOTE — ED Triage Notes (Signed)
Patient reports having right sided rib cage pain for couple days. Patient denies any n/v/d.  Patient denies anything making it better or worse.  Denies any SOB or chest pain.

## 2016-03-06 NOTE — ED Provider Notes (Addendum)
[facility] DEPT Provider Note   CSN: [id number] Arrival date & time: 03/06/16  0802   History   Chief Complaint Chief Complaint  Patient presents with  . right rib cage pain    HPI [name] is a 36 y.o. male.  HPI Patient presents to the emergency room with complaints of bilateral rib pain that he associates with a sickle cell crisis. 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 He started having pain in his rib cage bilaterally about 2 days ago. The pain is sharp, throbbing and constant in nature. He denies any trouble with cough or shortness of breath. At baseline his oxygen saturation is in the low 90s. He does have supplemental oxygen to use at home Patient tried taking his medications at home but it was not effective. He becomes to the emergency room yesterday for treatment and evaluation. Patient states his symptoms improved and he felt better so he was discharged. This morning his symptoms have increased in severity again. He denies any fevers or chills.  He does have history of pulmonary embolism in the past as well as acute chest syndrome. Past Medical History:  Diagnosis Date  . Acute chest syndrome ([place]) 06/18/2013  . Acute embolism and thrombosis of right internal jugular vein ([place])   . Alcohol consumption of one to four drinks per day   . Avascular necrosis (HCC)    Right Hip  . Blood transfusion   . Chronic anticoagulation   . Demand ischemia ([place]) 01/02/2014  . Former smoker   . Functional asplenia   . Hb-SS disease with crisis ([place])   . History of Clostridium difficile infection   . History of pulmonary embolus (PE)   . Hypertension   . Hypokalemia   . Leukocytosis    Chronic  . Mood disorder ([place])   . Noncompliance with medication regimen   . Oxygen deficiency   . Pulmonary hypertension   . Second hand tobacco smoke exposure   . Sickle cell anemia (HCC)   . Sickle-cell crisis with associated acute chest syndrome  ([place]) 05/13/2013  . Stroke ([place])   . Thrombocytosis (HCC)    Chronic  . Uses marijuana     Patient Active Problem List   Diagnosis Date Noted  . Slurred speech 11/10/2015  . Ischemic stroke ([place])   . Anemia 01/31/2015  . Chronic anemia   . Hb-SS disease without crisis ([place]) 10/07/2014  . Acute on chronic respiratory failure with hypoxia ([place])   . Abnormal EKG 09/10/2014  . Cor pulmonale, chronic ([place]) 08/25/2014  . Sickle cell anemia ([place]) 06/25/2014  . Anemia of chronic disease 06/25/2014  . PAH (pulmonary artery hypertension) 03/18/2014  . Chronic respiratory failure with hypoxia ([place]) 03/14/2014  . Paralytic strabismus, external ophthalmoplegia   . Chronic pain syndrome 12/12/2013  . Chronic anticoagulation 08/22/2013  . Essential hypertension 08/22/2013  . Pulmonary HTN 06/18/2013  . Functional asplenia   . Vitamin D deficiency 02/13/2013  . Hx of pulmonary embolus 06/29/2012  . Hemochromatosis 12/14/2011  . Avascular necrosis [facility])     Past Surgical History:  Procedure Laterality Date  . CHOLECYSTECTOMY     01/2008  . Excision of left periauricular cyst     10/2009  . Excision of right ear lobe cyst with primary closur     11/2007  . Porta cath placement    . Porta cath removal    . PORTACATH PLACEMENT  01/05/2012   Procedure: INSERTION  PORT-A-CATH;  Surgeon: Odis Hollingshead, MD;  Location: [facility];  Service: General;  Laterality: N/A;  ultrasound guiced port a cath insertion with fluoroscopy  . Right hip replacement     08/2006  . UMBILICAL HERNIA REPAIR     01/2008       Home Medications    Prior to Admission medications   Medication Sig Start Date End Date Taking? Authorizing Provider  ambrisentan (LETAIRIS) 5 MG tablet Take 5 mg by mouth daily.    Yes Historical Provider, MD  aspirin 81 MG chewable tablet Chew 1 tablet (81 mg total) by mouth daily. 07/23/14  Yes [name], MD  cholecalciferol (VITAMIN D) 1000 units tablet Take 2,000 Units by  mouth daily.   Yes Historical Provider, MD  Deferasirox (JADENU) 360 MG TABS Take 1,080 mg by mouth daily.    Yes Historical Provider, MD  folic acid (FOLVITE) 1 MG tablet Take 1 mg by mouth daily.   Yes Historical Provider, MD  gabapentin (NEURONTIN) 300 MG capsule Take 1 capsule (300 mg total) by mouth 3 (three) times daily. 06/01/15  Yes Tresa Garter, MD  hydroxyurea (HYDREA) 500 MG capsule Take 2 capsules (1,000 mg total) by mouth daily. Hold this medication until CBC and reticulocytes rechecked in 1 week and then restart to be determined by Primary Provider.  May take with food to minimize GI side effects. Hold Hydrea until evaluated by Primary Provider or Hematologist. 03/01/16  Yes [name], MD  morphine (MS CONTIN) 30 MG 12 hr tablet Take 1 tablet (30 mg total) by mouth every 12 (twelve) hours. 02/09/16 03/10/16 Yes Tresa Garter, MD  potassium chloride SA (K-DUR,KLOR-CON) 20 MEQ tablet Take 2 tablets (40 mEq total) by mouth daily. 12/23/15  Yes [name], MD  rivaroxaban (XARELTO) 20 MG TABS tablet Take 1 tablet (20 mg total) by mouth at bedtime. 12/29/15  Yes Tresa Garter, MD  zolpidem (AMBIEN) 10 MG tablet Take 1 tablet (10 mg total) by mouth at bedtime as needed for sleep. 02/09/16 03/10/16 Yes Tresa Garter, MD    Family History Family History  Problem Relation Age of Onset  . Sickle cell trait Mother   . Depression Mother   . Diabetes Mother   . Sickle cell trait Father   . Sickle cell trait Brother     Social History Social History  Substance Use Topics  . Smoking status: Former Smoker    Packs/day: 0.50    Years: 10.00    Types: Cigarettes    Quit date: 05/29/2011  . Smokeless tobacco: Never Used  . Alcohol use No     Allergies   Patient has no known allergies.   Review of Systems Review of Systems  All other systems reviewed and are negative.    Physical Exam Updated Vital Signs BP (!) 143/102   Pulse 98   Temp 98.5  F (36.9 C) (Oral)   Resp 18   Ht 6' (1.829 m)   Wt 79.4 kg   SpO2 91%   BMI 23.73 kg/m   Physical Exam  Constitutional: No distress.  HENT:  Head: Normocephalic and atraumatic.  Right Ear: External ear normal.  Left Ear: External ear normal.  Eyes: Conjunctivae are normal. Right eye exhibits no discharge. Left eye exhibits no discharge. No scleral icterus.  Neck: Neck supple. No tracheal deviation present.  Cardiovascular: Normal rate, regular rhythm and intact distal pulses.   Pulmonary/Chest: Effort normal and breath sounds  normal. No stridor. No respiratory distress. He has no wheezes. He has no rales.  Abdominal: Soft. Bowel sounds are normal. He exhibits no distension. There is no tenderness. There is no rebound and no guarding.  Musculoskeletal: He exhibits no edema or tenderness.  Neurological: He is alert. He has normal strength. No cranial nerve deficit (no facial droop, extraocular movements intact, no slurred speech) or sensory deficit. He exhibits normal muscle tone. He displays no seizure activity. Coordination normal.  Skin: Skin is warm and dry. No rash noted. He is not diaphoretic.  Psychiatric: He has a normal mood and affect.  Nursing note and vitals reviewed.    ED Treatments / Results  Labs (all labs ordered are listed, but only abnormal results are displayed) Labs Reviewed  COMPREHENSIVE METABOLIC PANEL - Abnormal; Notable for the following:       Result Value   Glucose, Bld 113 (*)    Total Bilirubin 4.2 (*)    All other components within normal limits  CBC WITH DIFFERENTIAL/PLATELET - Abnormal; Notable for the following:    WBC 21.6 (*)    RBC 2.55 (*)    Hemoglobin 7.8 (*)    HCT 22.9 (*)    RDW 20.7 (*)    Platelets 641 (*)    Neutro Abs 16.2 (*)    Monocytes Absolute 2.6 (*)    Basophils Absolute 0.2 (*)    All other components within normal limits  RETICULOCYTES - Abnormal; Notable for the following:    Retic Ct Pct 12.7 (*)    RBC. 2.55  (*)    Retic Count, Manual 323.9 (*)    All other components within normal limits    Radiology Dg Chest 2 View  Result Date: 03/06/2016 CLINICAL DATA:  36 year old male with right rib pain and sickle cell crisis. Initial encounter. EXAM: CHEST  2 VIEW COMPARISON:  02/29/2016 and earlier. FINDINGS: Stable cardiomegaly and mediastinal contours. Stable left chest power port, currently accessed. Interval resolved patchy and nodular right upper lung opacity. No pneumothorax, pulmonary edema, pleural effusion or acute pulmonary opacity. Stable basilar predominant mild chronic increased interstitial markings. Negative visible bowel gas pattern. Stable cholecystectomy clips. No acute osseous abnormality identified. IMPRESSION: Interval resolved right upper lobe opacity. Stable cardiomegaly with no acute cardiopulmonary abnormality. Electronically Signed   By: Genevie Ann M.D.   On: 03/06/2016 10:46   Ct Angio Chest Pe W And/or Wo Contrast  Result Date: 03/06/2016 CLINICAL DATA:  Chest pain, new hypoxia.  Sickle cell disease. EXAM: CT ANGIOGRAPHY CHEST WITH CONTRAST TECHNIQUE: Multidetector CT imaging of the chest was performed using the standard protocol during bolus administration of intravenous contrast. Multiplanar CT image reconstructions and MIPs were obtained to evaluate the vascular anatomy. CONTRAST:  100 cc Isovue 370 Additional 80 cc Isovue 370 given due to port leakage and patient motion. COMPARISON:  Chest CT angiogram dated 02/21/2016. FINDINGS: Cardiovascular: Despite repeat imaging, the peripheral segmental and subsegmental pulmonary arteries to the lower lobes are difficult to definitively characterize due to patient motion artifact and suboptimal contrast opacification. There is, however, no pulmonary embolism identified within the main, lobar or central segmental pulmonary arteries bilaterally. Patient has had chronic lower lobe subsegmental pulmonary emboli described on previous exams, not able to  be delineated again on today's study due to the patient motion artifact and suboptimal contrast opacification. Thoracic aorta is normal in caliber and configuration. Heart is prominently enlarged, stable compared to the previous exam. No pericardial effusion seen. Mediastinum/Nodes: Numerous small  and moderate-sized lymph nodes within the anterior mediastinum are stable. Normal residual thymic tissue is also noted within the anterior mediastinum. Esophagus appears normal. Trachea and central bronchi are unremarkable. Lungs/Pleura: Mild bibasilar scarring/atelectasis is stable. Lungs otherwise clear. No evidence of pneumonia. No pleural effusion or pneumothorax. Upper Abdomen: Limited images of the upper abdomen are unremarkable. Musculoskeletal: No acute or suspicious osseous lesion. Superficial soft tissues are unremarkable. Review of the MIP images confirms the above findings. IMPRESSION: 1. No acute pulmonary embolus identified, with mild study limitations detailed above. 2. Stable cardiomegaly. 3. No pneumonia or pulmonary edema. Stable mild scarring/fibrosis at the lung bases. 4. Stable mediastinal lymphadenopathy. Electronically Signed   By: Franki Cabot M.D.   On: 03/06/2016 13:30    Procedures Procedures (including critical care time)  Medications Ordered in ED Medications  0.45 % sodium chloride infusion ( Intravenous New Bag/Given 03/06/16 0924)  HYDROmorphone (DILAUDID) injection 2 mg (2 mg Intravenous Given 03/06/16 1412)    Or  HYDROmorphone (DILAUDID) injection 2 mg ( Subcutaneous See Alternative 03/06/16 1412)  promethazine (PHENERGAN) tablet 25 mg (not administered)  sodium chloride 0.9 % injection (not administered)  iopamidol (ISOVUE-370) 76 % injection (not administered)  ketorolac (TORADOL) 15 MG/ML injection 15 mg (15 mg Intravenous Given 03/06/16 0925)  diphenhydrAMINE (BENADRYL) injection 25 mg (25 mg Intravenous Given 03/06/16 0925)  HYDROmorphone (DILAUDID) injection 2 mg (2 mg  Intravenous Given 03/06/16 0926)    Or  HYDROmorphone (DILAUDID) injection 2 mg ( Subcutaneous See Alternative 03/06/16 0926)  HYDROmorphone (DILAUDID) injection 2 mg (2 mg Intravenous Given 03/06/16 1010)    Or  HYDROmorphone (DILAUDID) injection 2 mg ( Subcutaneous See Alternative 03/06/16 1010)  HYDROmorphone (DILAUDID) injection 2 mg (2 mg Intravenous Given 03/06/16 1106)    Or  HYDROmorphone (DILAUDID) injection 2 mg ( Subcutaneous See Alternative 03/06/16 1106)  iopamidol (ISOVUE-370) 76 % injection (100 mLs  Contrast Given 03/06/16 1235)  diphenhydrAMINE (BENADRYL) injection 25 mg (25 mg Intravenous Given 03/06/16 1420)     Initial Impression / Assessment and Plan / ED Course  I have reviewed the triage vital signs and the nursing notes.  Pertinent labs & imaging results that were available during my care of the patient were reviewed by me and considered in my medical decision making (see chart for details).  Clinical Course as of Mar 06 1546  Sun Mar 06, 2016  1120 O2 sat high 80s on supplemental oxygen.  Will ct to evaluate for PE.  No infiltrate on cxr   [JK]  1429 Pain improved.  Pt feels like he can go home.  [id number] Sats are 100% now on non rebreather.  Dw Dr Doreene Burke.  Pt has pulmonary hypertension and is not compliant with his oxygen at home.  Suspect this the issue.  He will see him in the ED  [JK]    Clinical Course User Index [JK] Dorie Rank, MD   CT scan without PE.  No PNA or infilatrate to suggest acute chest syndrome.  O2 sat improved with supplemental oxygen and he does that available at home.  WBC is persistently elevated.  Hgb is stable  Pt is feeling better and would prefer to go home.  Pt appears to be at his baseline at this point.   Discussed dc, close follow up with sickle cell clinic tomorrow.  Final Clinical Impressions(s) / ED Diagnoses   Final diagnoses:  Sickle cell crisis [facility])    New Prescriptions New Prescriptions   No medications  on file     Dorie Rank, MD 03/06/16 1431   Pt was getting ready for discharge however o2 sat is now in the 70s on his nasal cannula.  Will start him on non re breather.  A portion of his hypoxia may be related to opiates however he does not appear sedated and he is sitting up in the bed using his phone.  Will consult with hospitalist for admission.   Dorie Rank, MD 03/06/16 [phone]

## 2016-03-06 NOTE — ED Notes (Signed)
Pt was ambulated with portable O2 monitor and was found to be 76% on RA. Dr Doreene Burke witnessed and reports that pt is at his baseline.

## 2016-03-07 ENCOUNTER — Telehealth: Payer: Self-pay

## 2016-03-07 ENCOUNTER — Other Ambulatory Visit: Payer: Self-pay

## 2016-03-07 DIAGNOSIS — Z9114 Patient's other noncompliance with medication regimen: Secondary | ICD-10-CM | POA: Diagnosis not present

## 2016-03-07 DIAGNOSIS — D5700 Hb-SS disease with crisis, unspecified: Principal | ICD-10-CM

## 2016-03-07 DIAGNOSIS — I279 Pulmonary heart disease, unspecified: Secondary | ICD-10-CM | POA: Diagnosis not present

## 2016-03-07 DIAGNOSIS — I429 Cardiomyopathy, unspecified: Secondary | ICD-10-CM | POA: Diagnosis present

## 2016-03-07 DIAGNOSIS — D72829 Elevated white blood cell count, unspecified: Secondary | ICD-10-CM

## 2016-03-07 DIAGNOSIS — E559 Vitamin D deficiency, unspecified: Secondary | ICD-10-CM | POA: Diagnosis present

## 2016-03-07 DIAGNOSIS — Z79899 Other long term (current) drug therapy: Secondary | ICD-10-CM | POA: Diagnosis not present

## 2016-03-07 DIAGNOSIS — I272 Pulmonary hypertension, unspecified: Secondary | ICD-10-CM | POA: Diagnosis not present

## 2016-03-07 DIAGNOSIS — J9621 Acute and chronic respiratory failure with hypoxia: Secondary | ICD-10-CM | POA: Diagnosis present

## 2016-03-07 DIAGNOSIS — I2782 Chronic pulmonary embolism: Secondary | ICD-10-CM | POA: Diagnosis present

## 2016-03-07 DIAGNOSIS — D571 Sickle-cell disease without crisis: Secondary | ICD-10-CM

## 2016-03-07 DIAGNOSIS — I509 Heart failure, unspecified: Secondary | ICD-10-CM | POA: Diagnosis present

## 2016-03-07 DIAGNOSIS — D638 Anemia in other chronic diseases classified elsewhere: Secondary | ICD-10-CM

## 2016-03-07 DIAGNOSIS — Z87891 Personal history of nicotine dependence: Secondary | ICD-10-CM | POA: Diagnosis not present

## 2016-03-07 DIAGNOSIS — I2721 Secondary pulmonary arterial hypertension: Secondary | ICD-10-CM | POA: Diagnosis present

## 2016-03-07 DIAGNOSIS — Z7982 Long term (current) use of aspirin: Secondary | ICD-10-CM | POA: Diagnosis not present

## 2016-03-07 DIAGNOSIS — I10 Essential (primary) hypertension: Secondary | ICD-10-CM | POA: Diagnosis present

## 2016-03-07 DIAGNOSIS — Z8673 Personal history of transient ischemic attack (TIA), and cerebral infarction without residual deficits: Secondary | ICD-10-CM | POA: Diagnosis not present

## 2016-03-07 DIAGNOSIS — R7989 Other specified abnormal findings of blood chemistry: Secondary | ICD-10-CM | POA: Diagnosis present

## 2016-03-07 DIAGNOSIS — Z9981 Dependence on supplemental oxygen: Secondary | ICD-10-CM | POA: Diagnosis not present

## 2016-03-07 DIAGNOSIS — R0902 Hypoxemia: Secondary | ICD-10-CM

## 2016-03-07 DIAGNOSIS — Z7901 Long term (current) use of anticoagulants: Secondary | ICD-10-CM | POA: Diagnosis not present

## 2016-03-07 DIAGNOSIS — I110 Hypertensive heart disease with heart failure: Secondary | ICD-10-CM | POA: Diagnosis present

## 2016-03-07 DIAGNOSIS — I2781 Cor pulmonale (chronic): Secondary | ICD-10-CM | POA: Diagnosis present

## 2016-03-07 DIAGNOSIS — Z96641 Presence of right artificial hip joint: Secondary | ICD-10-CM | POA: Diagnosis present

## 2016-03-07 DIAGNOSIS — G8929 Other chronic pain: Secondary | ICD-10-CM | POA: Diagnosis present

## 2016-03-07 LAB — CBC WITH DIFFERENTIAL/PLATELET
BAND NEUTROPHILS: 0 %
BASOS ABS: 0 10*3/uL (ref 0.0–0.1)
BLASTS: 0 %
Basophils Relative: 0 %
Eosinophils Absolute: 0.4 10*3/uL (ref 0.0–0.7)
Eosinophils Relative: 2 %
HEMATOCRIT: 18.2 % — AB (ref 39.0–52.0)
HEMOGLOBIN: 6 g/dL — AB (ref 13.0–17.0)
LYMPHS PCT: 12 %
Lymphs Abs: 2.5 10*3/uL (ref 0.7–4.0)
MCH: 30.5 pg (ref 26.0–34.0)
MCHC: 33 g/dL (ref 30.0–36.0)
MCV: 92.4 fL (ref 78.0–100.0)
Metamyelocytes Relative: 0 %
Monocytes Absolute: 1.9 10*3/uL — ABNORMAL HIGH (ref 0.1–1.0)
Monocytes Relative: 9 %
Myelocytes: 0 %
NEUTROS ABS: 15.8 10*3/uL — AB (ref 1.7–7.7)
Neutrophils Relative %: 77 %
PROMYELOCYTES ABS: 0 %
Platelets: 496 10*3/uL — ABNORMAL HIGH (ref 150–400)
RBC: 1.97 MIL/uL — AB (ref 4.22–5.81)
RDW: 22.4 % — ABNORMAL HIGH (ref 11.5–15.5)
WBC: 20.6 10*3/uL — AB (ref 4.0–10.5)
nRBC: 12 /100 WBC — ABNORMAL HIGH

## 2016-03-07 LAB — BRAIN NATRIURETIC PEPTIDE: B Natriuretic Peptide: 319.6 pg/mL — ABNORMAL HIGH (ref 0.0–100.0)

## 2016-03-07 LAB — RETICULOCYTES
RBC.: 1.96 MIL/uL — ABNORMAL LOW (ref 4.22–5.81)
RETIC COUNT ABSOLUTE: 335.2 10*3/uL — AB (ref 19.0–186.0)
Retic Ct Pct: 17.1 % — ABNORMAL HIGH (ref 0.4–3.1)

## 2016-03-07 MED ORDER — HYDROMORPHONE 1 MG/ML IV SOLN
INTRAVENOUS | Status: DC
Start: 2016-03-07 — End: 2016-03-09
  Administered 2016-03-07: 11.9 mg via INTRAVENOUS
  Administered 2016-03-07: 1 mg via INTRAVENOUS
  Administered 2016-03-07: 2 mg via INTRAVENOUS
  Administered 2016-03-08: 3.99 mg via INTRAVENOUS
  Administered 2016-03-08: 2 mg via INTRAVENOUS
  Administered 2016-03-08 (×3): 4 mg via INTRAVENOUS
  Administered 2016-03-08: 18:00:00 via INTRAVENOUS
  Administered 2016-03-08: 4 mg via INTRAVENOUS
  Administered 2016-03-09: 3 mg via INTRAVENOUS
  Administered 2016-03-09: 2 mg via INTRAVENOUS
  Filled 2016-03-07 (×2): qty 25

## 2016-03-07 MED ORDER — HYDROMORPHONE HCL 4 MG PO TABS
4.0000 mg | ORAL_TABLET | ORAL | Status: DC
  Administered 2016-03-07 – 2016-03-09 (×12): 4 mg via ORAL
  Filled 2016-03-07 (×12): qty 1

## 2016-03-07 NOTE — Progress Notes (Signed)
[name]'s O2 sat resting was 90% HR-112 with O2 @ 3.5 L via nasal canula. When ambulating he desat. to 80% and his HR-137.

## 2016-03-07 NOTE — Progress Notes (Signed)
Patient watching TV in bed. No acute distress noted. O2 sats 96% on 4L via [place] HR 92

## 2016-03-07 NOTE — Care Management Obs Status (Signed)
[facility] NOTIFICATION   Patient Details  Name: DAYVEON BULMAN MRN: [id number] Date of Birth: September 24, 1980   Medicare Observation Status Notification Given:  Yes    MahabirJuliann Pulse, RN 03/07/2016, 3:12 PM

## 2016-03-07 NOTE — Progress Notes (Signed)
SICKLE CELL SERVICE PROGRESS NOTE  [name] [id number] DOB: [birth year] DOA: 03/06/2016 PCP: [name], MD  Assessment/Plan: Active Problems:   Hypoxia  1. Hypoxia: Pt's saturations was 96% at the time of discharge just 6 days prior to presentation. It was noted that patient's saturations increased with ambulation from 86% at rest on RA to 94% on RA with ambulation at the time of discharge. I have reviewed the last ECHO from [facility] 11/11/2015 which showed PFO and moderate TRV. ??? R-L shunt with mixing thus decreased saturations vs atelectasis. Will wean Oxygen and ask Pulmonology to see in consultation. 2. 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 Will resume his oral Dilaudid and since he is already on PCA will continue for PRN use and adjust settings. Continue Toradol. Decrease IVF to [facility] as patietn eating and drinking well.  3. Leukocytosis: Unclear of source. No evidence of infection. Will monitor.  4. Anemia of Chronic Disease: Pt has a mild decrease in Hb likely due to dilution. He has had a recovery of bone marrow activity so will resume Hydrea even though Hb at 6.0 but reticulocytosis is robust. Also continue folic acid. 5. Chronic pain: Continue MS Contin and Gabapentin. 6. Secondary Hemochromatosis: Continue Jadenu 7. 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 Continue Letairis 8. 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 Continue Xarelto.   Code Status: Full Code Family Communication: N/A Disposition Plan: Not yet ready for discharge  [place].  Pager [phone]. If 7PM-7AM, please contact night-coverage.  03/07/2016, 10:37 AM  LOS: 0 days   Interim History: Pt reports that his reason for coming to the hospital was upon insistence by his mother who told him that he was breathing "funny" during sleep. His description is that of intermittent apnea. He states that  except for that he would not have come to the ED and states that he does not feel that he has had a change in his breathing.  Pt has been placed on 10 l/min of Oxygen but was not wearing Oxygen when I entered the room. At that time his saturations on the PCA were noted to be 87%. As for his pain he states that his pain is aout 7/10 which is his baseline.    Consultants:  Pulmonologist- R.  Elsworth Soho, MD  Procedures:  None  Antibiotics:  None   Objective: Vitals:   03/07/16 0202 03/07/16 0414 03/07/16 0430 03/07/16 1035  BP:  132/81  124/78  Pulse:  (!) 101  99  Resp:  (!) 22 (!) 27 (!) 22  Temp:  98 F (36.7 C)  98.8 F (37.1 C)  TempSrc:  Oral  Oral  SpO2:  94% 96% 90%  Weight: 79.6 kg (175 lb 6.4 oz)     Height: 6' (1.829 m)      Weight change:   Intake/Output Summary (Last 24 hours) at 03/07/16 1037 Last data filed at 03/07/16 1037  Gross per 24 hour  Intake              960 ml  Output             2050 ml  Net            -1090 ml    General: Alert, awake, oriented x3, in no acute distress. Pt appears normal clinical state. No appearance of clinical distress HEENT: [place]/AT PEERL, EOMI, anicteric Neck: Trachea midline,  no masses, no thyromegal,y no  JVD, no carotid bruit OROPHARYNX:  Moist, No exudate/ erythema/lesions.  Heart: Regular rate and rhythm, II/VI SEM over pre-cordium but best appreciated at the 2nd intercostal space on left. No rubs or gallops. PMI non-displaced, no heaves or thrills on palpation.  Lungs: Clear to auscultation, no wheezing or rhonchi noted. No increased vocal fremitus resonant to percussion  Abdomen: Soft, nontender, nondistended, positive bowel sounds, no masses no hepatosplenomegaly noted.  Neuro: No focal neurological deficits noted cranial nerves II through XII grossly intact.  Strength at baseline in bilateral upper and lower extremities. Musculoskeletal: No warmth swelling or erythema around joints, no spinal tenderness noted. Psychiatric:  Patient alert and oriented x3, good insight and cognition, good recent to remote recall.    Data Reviewed: Basic Metabolic Panel:  Recent Labs Lab 03/01/16 1102 03/05/16 1837 03/06/16 0846  NA 137 134* 136  K 4.1 3.4* 3.7  CL 105 102 104  CO2 26 24 24   GLUCOSE 148* 101* 113*  BUN 19 9 12   CREATININE 0.87 0.85 1.12  CALCIUM 9.0 9.4 9.3   Liver Function Tests:  Recent Labs Lab 03/05/16 1837 03/06/16 0846  AST 36 33  ALT 25 23  ALKPHOS 99 110  BILITOT 4.2* 4.2*  PROT 8.1 8.0  ALBUMIN 4.3 4.5   No results for input(s): LIPASE, AMYLASE in the last 168 hours. No results for input(s): AMMONIA in the last 168 hours. CBC:  Recent Labs Lab 03/01/16 1102 03/05/16 1837 03/06/16 0846 03/07/16 0535  WBC 12.9* 20.0* 21.6* 20.6*  NEUTROABS 8.7* 14.4* 16.2* 15.8*  HGB 7.6* 7.4* 7.8* 6.0*  HCT 22.1* 22.4* 22.9* 18.2*  MCV 87.4 88.5 89.8 92.4  PLT 412* 619* 641* 496*   Cardiac Enzymes: No results for input(s): CKTOTAL, CKMB, CKMBINDEX, TROPONINI in the last 168 hours. BNP (last 3 results)  Recent Labs  08/02/15 2307 11/28/15 0520 02/11/16 1006  BNP 503.2* 422.5* 381.0*    ProBNP (last 3 results) No results for input(s): PROBNP in the last 8760 hours.  CBG: No results for input(s): GLUCAP in the last 168 hours.  No results found for this or any previous visit (from the past 240 hour(s)).   Studies: Dg Chest 2 View  Result Date: 03/06/2016 CLINICAL DATA:  36 year old male with right rib pain and sickle cell crisis. Initial encounter. EXAM: CHEST  2 VIEW COMPARISON:  02/29/2016 and earlier. FINDINGS: Stable cardiomegaly and mediastinal contours. Stable left chest power port, currently accessed. Interval resolved patchy and nodular right upper lung opacity. No pneumothorax, pulmonary edema, pleural effusion or acute pulmonary opacity. Stable basilar predominant mild chronic increased interstitial markings. Negative visible bowel gas pattern. Stable cholecystectomy  clips. No acute osseous abnormality identified. IMPRESSION: Interval resolved right upper lobe opacity. Stable cardiomegaly with no acute cardiopulmonary abnormality. Electronically Signed   By: Genevie Ann M.D.   On: 03/06/2016 10:46   Dg Chest 2 View  Result Date: 02/29/2016 CLINICAL DATA:  Hypoxia and weakness.  Sickle cell. EXAM: CHEST  2 VIEW COMPARISON:  02/23/2016. FINDINGS: Trachea is midline. Left subclavian power port tip is in the high right atrium. Heart is enlarged, stable. Mild patchy right upper lobe airspace opacification. Streaky atelectasis or scarring in the left lower lobe. No pleural fluid. IMPRESSION: Mild right upper lobe patchy airspace opacification. Electronically Signed   By: Lorin Picket M.D.   On: 02/29/2016 12:53   Dg Chest 2 View  Result Date: 02/23/2016 CLINICAL DATA:  SICKLE CELL CRISIS EXAM: CHEST  2 VIEW COMPARISON:  CT  02/21/2016 FINDINGS: LEFT-SIDED POWER PORT UNCHANGED. STABLE CARDIAC SILHOUETTE. THERE IS MILD CENTRAL VENOUS CONGESTION. NO FOCAL INFILTRATE. NO PLEURAL FLUID. NO PNEUMOTHORAX. IMPRESSION: 1. STABLE CARDIOMEGALY. 2. STABLE CENTRAL VENOUS PULMONARY CONGESTION. 3. NO EVIDENCE OF PULMONARY INFILTRATE Electronically Signed   By: Suzy Bouchard M.D.   On: 02/23/2016 16:35   Dg Chest 2 View  Result Date: 02/21/2016 CLINICAL DATA:  New onset chest pain beginning today. EXAM: CHEST  2 VIEW COMPARISON:  02/19/2016 FINDINGS: Chronic enlargement of the cardiac silhouette. Power port tip in the SVC just above the right atrium as seen previously. Pulmonary venous hypertension, possibly with early interstitial edema. No advanced edema. No consolidation or collapse. No effusions. IMPRESSION: Chronic cardiomegaly. Worsening of the pulmonary vascular pattern suggesting venous hypertension with early interstitial edema. Electronically Signed   By: Nelson Chimes M.D.   On: 02/21/2016 07:49   Dg Chest 2 View  Result Date: 02/19/2016 CLINICAL DATA:  Sickle cell pain  crisis, worsening bilat lower chest pain since yesterday EXAM: CHEST  2 VIEW COMPARISON:  02/11/2016 FINDINGS: Left Port-A-Cath is unchanged in position. Midline trachea. Moderate cardiomegaly. Mediastinal contours otherwise within normal limits. No pleural effusion or pneumothorax. No congestive failure. Lower lobe predominant pulmonary interstitial thickening is chronic and nonspecific. No lobar consolidation. IMPRESSION: Cardiomegaly with chronic interstitial thickening. No acute findings. Electronically Signed   By: Abigail Miyamoto M.D.   On: 02/19/2016 07:18   Ct Angio Chest Pe W And/or Wo Contrast  Result Date: 03/06/2016 CLINICAL DATA:  Chest pain, new hypoxia.  Sickle cell disease. EXAM: CT ANGIOGRAPHY CHEST WITH CONTRAST TECHNIQUE: Multidetector CT imaging of the chest was performed using the standard protocol during bolus administration of intravenous contrast. Multiplanar CT image reconstructions and MIPs were obtained to evaluate the vascular anatomy. CONTRAST:  100 cc Isovue 370 Additional 80 cc Isovue 370 given due to port leakage and patient motion. COMPARISON:  Chest CT angiogram dated 02/21/2016. FINDINGS: Cardiovascular: Despite repeat imaging, the peripheral segmental and subsegmental pulmonary arteries to the lower lobes are difficult to definitively characterize due to patient motion artifact and suboptimal contrast opacification. There is, however, no pulmonary embolism identified within the main, lobar or central segmental pulmonary arteries bilaterally. Patient has had chronic lower lobe subsegmental pulmonary emboli described on previous exams, not able to be delineated again on today's study due to the patient motion artifact and suboptimal contrast opacification. Thoracic aorta is normal in caliber and configuration. Heart is prominently enlarged, stable compared to the previous exam. No pericardial effusion seen. Mediastinum/Nodes: Numerous small and moderate-sized lymph nodes within the  anterior mediastinum are stable. Normal residual thymic tissue is also noted within the anterior mediastinum. Esophagus appears normal. Trachea and central bronchi are unremarkable. Lungs/Pleura: Mild bibasilar scarring/atelectasis is stable. Lungs otherwise clear. No evidence of pneumonia. No pleural effusion or pneumothorax. Upper Abdomen: Limited images of the upper abdomen are unremarkable. Musculoskeletal: No acute or suspicious osseous lesion. Superficial soft tissues are unremarkable. Review of the MIP images confirms the above findings. IMPRESSION: 1. No acute pulmonary embolus identified, with mild study limitations detailed above. 2. Stable cardiomegaly. 3. No pneumonia or pulmonary edema. Stable mild scarring/fibrosis at the lung bases. 4. Stable mediastinal lymphadenopathy. Electronically Signed   By: Franki Cabot M.D.   On: 03/06/2016 13:30   Ct Angio Chest Pe W Or Wo Contrast  Result Date: 02/21/2016 CLINICAL DATA:  Sickle cell disease.  Chest pain, bilateral rib pain EXAM: CT ANGIOGRAPHY CHEST WITH CONTRAST TECHNIQUE: Multidetector CT imaging of the  chest was performed using the standard protocol during bolus administration of intravenous contrast. Multiplanar CT image reconstructions and MIPs were obtained to evaluate the vascular anatomy. CONTRAST:  100 mL Isovue 370 COMPARISON:  01/05/2016, 11/10/2015 FINDINGS: Cardiovascular: Satisfactory opacification of the central and lobar pulmonary arteries with somewhat suboptimal opacification of the subsegmental pulmonary arteries. Chronic bilateral lower lobe subsegmental occlusive pulmonary emboli. No acute pulmonary embolus. Main pulmonary artery is enlarged measuring 4.2 cm in diameter. Right heart strain (RV/ LV equals 1.0). Severe cardiomegaly. Trace pericardial effusion. Thoracic aorta is normal in caliber. No thoracic aortic dissection or aneurysm. Mediastinum/Nodes: Mild mediastinal lymphadenopathy is unchanged compared with multiple prior  examinations. Left-sided Port-A-Cath with the tip at the cavoatrial junction. Normal esophagus. Normal thyroid. Lungs/Pleura: No pleural effusion or pneumothorax. Bilateral patchy areas of ground-glass opacity in the upper and lower lobes both central and peripheral distribution similar in appearance to the prior exam. No focal consolidation, pleural effusion or pneumothorax. Upper Abdomen: Involuted and calcified spleen consistent with history of sickle cell disease. No acute upper abdominal abnormality. Musculoskeletal: No acute osseous abnormality. 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 Review of the MIP images confirms the above findings. IMPRESSION: 1. No acute pulmonary embolus. Chronic bilateral lower lobe subsegmental pulmonary emboli unchanged in the prior exam. Chronic right heart strain with dilatation of the main pulmonary artery as can be seen with pulmonary arterial hypertension. 2. Stable cardiomegaly. 3. Patchy ground-glass opacities bilaterally suggesting mild pulmonary edema versus an infectious or inflammatory etiology. 4. Stable mediastinal lymphadenopathy. Electronically Signed   By: Kathreen Devoid   On: 02/21/2016 10:46   US Renal  Result Date: 02/11/2016 CLINICAL DATA:  Renal failure.  Sickle cell disease EXAM: RENAL / URINARY TRACT ULTRASOUND COMPLETE COMPARISON:  CT abdomen and pelvis December 15, 2015 FINDINGS: Right Kidney: Length: 11.5 cm. Echogenicity and renal cortical thickness are within normal limits. No mass, perinephric fluid, or hydronephrosis visualized. No sonographically demonstrable calculus or ureterectasis. Left Kidney: Length: 11.7 cm. Echogenicity and renal cortical thickness are within normal limits. No mass, perinephric fluid, or hydronephrosis visualized. No sonographically demonstrable calculus or ureterectasis. Bladder: Appears normal for degree of bladder distention. IMPRESSION: Study within normal limits.  Electronically Signed   By: Lowella Grip III M.D.   On: 02/11/2016 15:20   Dg Chest Portable 1 View  Result Date: 02/11/2016 CLINICAL DATA:  Sickle cell anemia.  Hypoxia. EXAM: PORTABLE CHEST 1 VIEW COMPARISON:  01/31/2016. FINDINGS: PowerPort catheter with lead tip projected over right atrium. Cardiomegaly with normal pulmonary vascularity. No focal infiltrate. No pleural effusion or pneumothorax. IMPRESSION: 1. PowerPort catheter noted with lead tip projected over right atrium. 2. Cardiomegaly with pulmonary vascular prominence and bilateral interstitial prominence consistent with mild congestive heart failure. Electronically Signed   By: Elmo   On: 02/11/2016 10:22    Scheduled Meds: . ambrisentan  5 mg Oral Daily  . aspirin  81 mg Oral Daily  . cholecalciferol  2,000 Units Oral Daily  . Deferasirox  1,080 mg Oral Daily  . folic acid  1 mg Oral Daily  . gabapentin  300 mg Oral TID  . HYDROmorphone   Intravenous Q4H  . hydroxyurea  1,000 mg Oral Daily  . morphine  30 mg Oral Q12H  . rivaroxaban  20 mg Oral QHS   Continuous Infusions: . sodium chloride 100 mL/hr at 03/07/16 0421    Active Problems:   Hypoxia    In excess of 35 minutes spent  during this visit. Greater than 50% involved face to face contact with the patient for assessment, counseling and coordination of care.

## 2016-03-07 NOTE — Consult Note (Signed)
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 He is on Letairis and xarelto at baseline (last seen by pulmonary July 2017). He is non-compliant w/ oxygen at home. He was admitted on 2/4 after presenting to the ER at his mother's insistence w/ what sounded to be c/w periods of sleep apnea. In the ER he felt essentially to be at baseline status however his O2 sats were in the 80s and required supplemental oxygen to raise above 90%. He has had a recent bubble study echo in Oct 2017 that suggested + PFO. We were asked to see him on 2/5 d/t increasing o2 needs.   SIGNIFICANT EVENTS    STUDIES:    PAST MEDICAL HISTORY :   has a past medical history of Acute chest syndrome ([place]) (06/18/2013); Acute embolism and thrombosis of right internal jugular vein ([place]); Alcohol consumption of one to four drinks per day; Avascular necrosis ([place]); Blood transfusion; Chronic anticoagulation; Demand ischemia ([place]) (01/02/2014); Former smoker; Functional asplenia; Hb-SS disease with crisis ([place]); History of Clostridium difficile infection; History of pulmonary embolus (PE); Hypertension; Hypokalemia; Leukocytosis; Mood disorder ([place]); Noncompliance with medication regimen; Oxygen deficiency; Pulmonary hypertension; Second hand tobacco smoke exposure; Sickle cell anemia ([place]); Sickle-cell crisis with associated acute chest syndrome ([place]) (05/13/2013); Stroke [facility]); Thrombocytosis ([place]); and Uses marijuana.  has a past surgical history that includes Right hip replacement; Cholecystectomy; Porta cath placement; Porta cath removal; Umbilical hernia repair; Excision of left periauricular cyst; Excision of right ear lobe cyst with  primary closur; and Portacath placement (01/05/2012). Prior to Admission medications   Medication Sig Start Date End Date Taking? Authorizing Provider  ambrisentan (LETAIRIS) 5 MG tablet Take 5 mg by mouth daily.    Yes Historical Provider, MD  aspirin 81 MG chewable tablet Chew 1 tablet (81 mg total) by mouth daily. 07/23/14  Yes [name], MD  cholecalciferol (VITAMIN D) 1000 units tablet Take 2,000 Units by mouth daily.   Yes Historical Provider, MD  Deferasirox (JADENU) 360 MG TABS Take 1,080 mg by mouth daily.    Yes Historical Provider, MD  folic acid (FOLVITE) 1 MG tablet Take 1 mg by mouth daily.   Yes Historical Provider, MD  gabapentin (NEURONTIN) 300 MG capsule Take 1 capsule (300 mg total) by mouth 3 (three) times daily. 06/01/15  Yes Tresa Garter, MD  hydroxyurea (HYDREA) 500 MG capsule Take 2 capsules (1,000 mg total) by mouth daily. Hold this medication until CBC and reticulocytes rechecked in 1 week and then restart to be determined by Primary Provider.  May take with food to minimize GI side effects. Hold Hydrea until evaluated by Primary Provider or Hematologist. 03/01/16  Yes [name], MD  morphine (MS CONTIN) 30 MG 12 hr tablet Take 1 tablet (30 mg total) by mouth every 12 (twelve) hours. 02/09/16 03/10/16 Yes Tresa Garter, MD  potassium chloride SA (K-DUR,KLOR-CON) 20 MEQ tablet Take 2 tablets (40 mEq total) by mouth daily. 12/23/15  Yes [name], MD  rivaroxaban (XARELTO) 20 MG TABS tablet Take 1 tablet (20 mg total) by mouth at bedtime. 12/29/15  Yes Tresa Garter, MD  zolpidem Lorrin Mais)  10 MG tablet Take 1 tablet (10 mg total) by mouth at bedtime as needed for sleep. 02/09/16 03/10/16 Yes Tresa Garter, MD   No Known Allergies  FAMILY HISTORY:  family history includes Depression in his mother; Diabetes in his mother; Sickle cell trait in his brother, father, and mother. SOCIAL HISTORY:  reports that he quit smoking about 4  years ago. His smoking use included Cigarettes. He has a 5.00 pack-year smoking history. He has never used smokeless tobacco. He reports that he does not drink alcohol or use drugs.  REVIEW OF SYSTEMS:   Constitutional: Negative for fever, chills, weight loss, malaise/++fatigue and diaphoresis.  HENT: Negative for hearing loss, ear pain, nosebleeds, congestion, sore throat, neck pain, tinnitus and ear discharge.   Eyes: Negative for blurred vision, double vision, photophobia, pain, discharge and redness.  Respiratory: Negative for cough, hemoptysis, sputum production, ++shortness of breath since September. Can't really even walk room to room w/out shortness of breath, no wheezing and stridor.   Cardiovascular: Negative for chest pain, palpitations, orthopnea, claudication, leg swelling and PND. Swelling has been well controlled over last month but some times legs get very big Gastrointestinal: Negative for heartburn, nausea, vomiting, abdominal pain, diarrhea, constipation, blood in stool and melena. no abd distention  Genitourinary: Negative for dysuria, urgency, frequency, hematuria and flank pain.  Musculoskeletal: Negative for myalgias, back pain, joint pain and falls.  Skin: Negative for itching and rash.  Neurological: Negative for dizziness, tingling, tremors, sensory change, speech change, focal weakness, seizures, loss of consciousness, weakness and headaches.  Endo/Heme/Allergies: Negative for environmental allergies and polydipsia. Does not bruise/bleed easily.  SUBJECTIVE:  Resting comfortable  VITAL SIGNS: Temp:  [98 F (36.7 C)-98.8 F (37.1 C)] 98.8 F (37.1 C) (02/05 1035) Pulse Rate:  [82-104] 99 (02/05 1035) Resp:  [16-27] 22 (02/05 1035) BP: (106-143)/(63-102) 124/78 (02/05 1035) SpO2:  [78 %-100 %] 90 % (02/05 1035) Weight:  [175 lb (79.4 kg)-175 lb 6.4 oz (79.6 kg)] 175 lb 6.4 oz (79.6 kg) (02/05 0202)  PHYSICAL EXAMINATION: General:  36 year old male, no acute  distress.  Neuro:  Awake and alert, affect flat.  HEENT:  NCAT, no JVD  Cardiovascular:  RRR w/ holosystolic murmur  Lungs:  Clear, no accessory use  Abdomen:  Soft not tender + bowel sounds  Musculoskeletal:  Equal st and bulk  Skin:  Warm and dry    Recent Labs Lab 03/01/16 1102 03/05/16 1837 03/06/16 0846  NA 137 134* 136  K 4.1 3.4* 3.7  CL 105 102 104  CO2 26 24 24   BUN 19 9 12   CREATININE 0.87 0.85 1.12  GLUCOSE 148* 101* 113*    Recent Labs Lab 03/05/16 1837 03/06/16 0846 03/07/16 0535  HGB 7.4* 7.8* 6.0*  HCT 22.4* 22.9* 18.2*  WBC 20.0* 21.6* 20.6*  PLT 619* 641* 496*   Dg Chest 2 View  Result Date: 03/06/2016 CLINICAL DATA:  36 year old male with right rib pain and sickle cell crisis. Initial encounter. EXAM: CHEST  2 VIEW COMPARISON:  02/29/2016 and earlier. FINDINGS: Stable cardiomegaly and mediastinal contours. Stable left chest power port, currently accessed. Interval resolved patchy and nodular right upper lung opacity. No pneumothorax, pulmonary edema, pleural effusion or acute pulmonary opacity. Stable basilar predominant mild chronic increased interstitial markings. Negative visible bowel gas pattern. Stable cholecystectomy clips. No acute osseous abnormality identified. IMPRESSION: Interval resolved right upper lobe opacity. Stable cardiomegaly with no acute cardiopulmonary abnormality. Electronically Signed   By: Genevie Ann  M.D.   On: 03/06/2016 10:46   Ct Angio Chest Pe W And/or Wo Contrast  Result Date: 03/06/2016 CLINICAL DATA:  Chest pain, new hypoxia.  Sickle cell disease. EXAM: CT ANGIOGRAPHY CHEST WITH CONTRAST TECHNIQUE: Multidetector CT imaging of the chest was performed using the standard protocol during bolus administration of intravenous contrast. Multiplanar CT image reconstructions and MIPs were obtained to evaluate the vascular anatomy. CONTRAST:  100 cc Isovue 370 Additional 80 cc Isovue 370 given due to port leakage and patient motion.  COMPARISON:  Chest CT angiogram dated 02/21/2016. FINDINGS: Cardiovascular: Despite repeat imaging, the peripheral segmental and subsegmental pulmonary arteries to the lower lobes are difficult to definitively characterize due to patient motion artifact and suboptimal contrast opacification. There is, however, no pulmonary embolism identified within the main, lobar or central segmental pulmonary arteries bilaterally. Patient has had chronic lower lobe subsegmental pulmonary emboli described on previous exams, not able to be delineated again on today's study due to the patient motion artifact and suboptimal contrast opacification. Thoracic aorta is normal in caliber and configuration. Heart is prominently enlarged, stable compared to the previous exam. No pericardial effusion seen. Mediastinum/Nodes: Numerous small and moderate-sized lymph nodes within the anterior mediastinum are stable. Normal residual thymic tissue is also noted within the anterior mediastinum. Esophagus appears normal. Trachea and central bronchi are unremarkable. Lungs/Pleura: Mild bibasilar scarring/atelectasis is stable. Lungs otherwise clear. No evidence of pneumonia. No pleural effusion or pneumothorax. Upper Abdomen: Limited images of the upper abdomen are unremarkable. Musculoskeletal: No acute or suspicious osseous lesion. Superficial soft tissues are unremarkable. Review of the MIP images confirms the above findings. IMPRESSION: 1. No acute pulmonary embolus identified, with mild study limitations detailed above. 2. Stable cardiomegaly. 3. No pneumonia or pulmonary edema. Stable mild scarring/fibrosis at the lung bases. 4. Stable mediastinal lymphadenopathy. Electronically Signed   By: Franki Cabot M.D.   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  Suspect that this is primarily a result of his PAH. His RV pressures are so high he actually has PFO type shunt and suspect oxygen non-compliance would exacerbate this. Glad to see that he is already on NOAC and Letairis. Probably the best short term intervention we can do is to determine his actual O2 needs, this would include assessing him for nocturnal desaturations.  Plan Continuous pulse ox w/walking and nocturnal saturations Daily weight; currently he is at his dry weight so doubt additional diuretics will help much Will need to be on oxygen 24/7 Should go back to [facility] at [facility]. Probably time to re-eval his PAH regimen.  03/07/2016, 12:14 PM

## 2016-03-08 ENCOUNTER — Other Ambulatory Visit: Payer: Self-pay | Admitting: Internal Medicine

## 2016-03-08 ENCOUNTER — Encounter (HOSPITAL_COMMUNITY): Payer: Self-pay

## 2016-03-08 DIAGNOSIS — Z7901 Long term (current) use of anticoagulants: Secondary | ICD-10-CM

## 2016-03-08 DIAGNOSIS — I2721 Secondary pulmonary arterial hypertension: Secondary | ICD-10-CM

## 2016-03-08 DIAGNOSIS — F5101 Primary insomnia: Secondary | ICD-10-CM

## 2016-03-08 DIAGNOSIS — I2781 Cor pulmonale (chronic): Secondary | ICD-10-CM

## 2016-03-08 LAB — DIFFERENTIAL
Basophils Absolute: 0.2 10*3/uL — ABNORMAL HIGH (ref 0.0–0.1)
Basophils Relative: 1 %
Eosinophils Absolute: 0.7 10*3/uL (ref 0.0–0.7)
Eosinophils Relative: 4 %
Lymphocytes Relative: 23 %
Lymphs Abs: 4 10*3/uL (ref 0.7–4.0)
Monocytes Absolute: 1.9 10*3/uL — ABNORMAL HIGH (ref 0.1–1.0)
Monocytes Relative: 11 %
Neutro Abs: 10.6 10*3/uL — ABNORMAL HIGH (ref 1.7–7.7)
Neutrophils Relative %: 61 %
nRBC: 20 /100 WBC — ABNORMAL HIGH

## 2016-03-08 LAB — CBC
HCT: 20.6 % — ABNORMAL LOW (ref 39.0–52.0)
Hemoglobin: 7 g/dL — ABNORMAL LOW (ref 13.0–17.0)
MCH: 31.5 pg (ref 26.0–34.0)
MCHC: 34 g/dL (ref 30.0–36.0)
MCV: 92.8 fL (ref 78.0–100.0)
Platelets: 553 10*3/uL — ABNORMAL HIGH (ref 150–400)
RBC: 2.22 MIL/uL — ABNORMAL LOW (ref 4.22–5.81)
RDW: 22.9 % — ABNORMAL HIGH (ref 11.5–15.5)
WBC: 17.4 10*3/uL — ABNORMAL HIGH (ref 4.0–10.5)

## 2016-03-08 LAB — BASIC METABOLIC PANEL
ANION GAP: 4 — AB (ref 5–15)
BUN: 10 mg/dL (ref 6–20)
CHLORIDE: 109 mmol/L (ref 101–111)
CO2: 24 mmol/L (ref 22–32)
CREATININE: 0.79 mg/dL (ref 0.61–1.24)
Calcium: 8.5 mg/dL — ABNORMAL LOW (ref 8.9–10.3)
GFR calc non Af Amer: >60 mL/min (ref >60)
Glucose, Bld: 122 mg/dL — ABNORMAL HIGH (ref 65–99)
Potassium: 3.8 mmol/L (ref 3.5–5.1)
SODIUM: 137 mmol/L (ref 135–145)

## 2016-03-08 LAB — RETICULOCYTES
RBC.: 2.21 MIL/uL — ABNORMAL LOW (ref 4.22–5.81)
Retic Count, Absolute: 391.2 10*3/uL — ABNORMAL HIGH (ref 19.0–186.0)
Retic Ct Pct: 17.7 % — ABNORMAL HIGH (ref 0.4–3.1)

## 2016-03-08 MED ORDER — FUROSEMIDE 40 MG PO TABS
40.0000 mg | ORAL_TABLET | Freq: Once | ORAL | Status: AC
  Administered 2016-03-08: 40 mg via ORAL
  Filled 2016-03-08: qty 1

## 2016-03-08 MED ORDER — MORPHINE SULFATE ER 30 MG PO TBCR: 30.0000 mg | EXTENDED_RELEASE_TABLET | Freq: Two times a day (BID) | ORAL | 0 refills | Status: DC

## 2016-03-08 MED ORDER — HYDROMORPHONE HCL 4 MG PO TABS: 4.0000 mg | ORAL_TABLET | ORAL | 0 refills | Status: DC | PRN

## 2016-03-08 MED ORDER — ZOLPIDEM TARTRATE 10 MG PO TABS: 10.0000 mg | ORAL_TABLET | Freq: Every evening | ORAL | 0 refills | Status: DC | PRN

## 2016-03-08 MED ORDER — FUROSEMIDE 40 MG PO TABS
40.0000 mg | ORAL_TABLET | Freq: Once | ORAL | Status: AC
  Administered 2016-03-09: 40 mg via ORAL
  Filled 2016-03-08: qty 1

## 2016-03-08 NOTE — Progress Notes (Signed)
Patient's oxygen saturation on 2 liters whilst walking ( one lap in the hallway) was 92% and heart rate 123. Back to his room rested for 10 mins and rechecked while resting, saturation was 93% on 2 liters , heart rate 97

## 2016-03-08 NOTE — Progress Notes (Signed)
SICKLE CELL SERVICE PROGRESS NOTE  [name] [id number] DOB: [birth year] DOA: 03/06/2016 PCP: Gerald Chessman, MD  Assessment/Plan: Active Problems:   Hypoxia  1. Hypoxia: Appreciate Pulmonology input. Agree that patient should use Oxygen 24 hours. He has been counseled to that effect in the past but has not been adherent. I have tasked patient to obtain a follow up appointment with Dr. Karena [name] for re-evaluation meanwhile he is to wear Oxygen ATC. He is also to avoid routine IVF for pain crisis and encouraged to drink fluids instead. His dry weight is 75 kg and he should be weighed on a regular basis.  2. Elevated BNP: Start gentle diuresis. 3. 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 Continue Toradol.   4. Leukocytosis: Unclear of source. No evidence of infection. Will monitor.  5. Anemia of Chronic Disease: Hb at baseline and reticulocytosis remains robust.. Continue Hydrea and Folic Acid. 6. Chronic pain: Continue MS Contin and Gabapentin. 7. Secondary Hemochromatosis: Continue Jadenu 8. 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 Continue Letairis 9. 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 Continue Xarelto. 10. Psychosocial: Pt seems to lack insight into the severity of his disease and states that his visits with his Primary Provider are focused on obtaining his pain medications. He seems to be unaware that the issues that frequently bring him to the hospital need to be addressed with his Primary Provider and that there should be coordination of care with is Primary Provider with regard to his specialty visits. I will contact his Primary Care Physician and as that they query the patient about these issues when he is seen in the office as it is unlikely that he would initiate a complaint about symptoms as he seem them as completely separate. Pt  does not have a travel tank and was supposed to follow up in their office to arrange for the tank. I have advised patient that this should also be addressed with his PMD.    Code Status: Full Code Family Communication: N/A Disposition Plan: Possible discharge today or tomorrow.   [name] A.  Pager [phone]. If 7PM-7AM, please contact night-coverage.  03/08/2016, 10:57 AM  LOS: 1 day   Interim History: Pt reports that his reason for coming to the hospital was upon insistence by his mother who told him that he was breathing "funny" during sleep. His description is that of intermittent apnea. He states that except for that he would not have come to the ED and states that he does not feel that he has had a change in his breathing.  Pt has been placed on 10 l/min of Oxygen but was not wearing Oxygen when I entered the room. At that time his saturations on the PCA were noted to be 87%. As for his pain he states that his pain is aout 7/10 which is his baseline.    Consultants:  Pulmonologist- R.  Elsworth Soho, MD  Procedures:  None  Antibiotics:  None   Objective: Vitals:   03/08/16 0332 03/08/16 0543 03/08/16 0832 03/08/16 1026  BP:  120/68  115/82  Pulse:  86  93  Resp: 17 16 14 14   Temp:  98 F (36.7 C)  98.5 F (36.9 C)  TempSrc:  Oral  Oral  SpO2: 97% 93% 98% 94%  Weight:  79.4 kg (175 lb)    Height:       Weight change:  0 kg (0 lb)  Intake/Output Summary (Last 24 hours) at 03/08/16 1057 Last data filed at 03/08/16 [id number]  Gross per 24 hour  Intake          2630.67 ml  Output             2050 ml  Net           580.67 ml    General: Alert, awake, oriented x3, in no acute distress. Pt appears normal clinical state. No appearance of clinical distress HEENT: [name]/AT PEERL, EOMI, anicteric Heart: Regular rate and rhythm, II/VI SEM over pre-cordium but best appreciated at the 2nd intercostal space on left. Loud P2 sound on auscultation. No rubs or gallops. PMI  non-displaced, no heaves or thrills on palpation.  Lungs: Clear to auscultation, no wheezing or rhonchi noted. No increased vocal fremitus resonant to percussion  Abdomen: Soft, nontender, nondistended, positive bowel sounds, no masses no hepatosplenomegaly noted.  Neuro: No focal neurological deficits noted cranial nerves II through XII grossly intact.  Strength at baseline in bilateral upper and lower extremities. Musculoskeletal: No warmth swelling or erythema around joints, no spinal tenderness noted. Psychiatric: Patient alert and oriented x 3.   Data Reviewed: Basic Metabolic Panel:  Recent Labs Lab 03/01/16 1102 03/05/16 1837 03/06/16 0846  NA 137 134* 136  K 4.1 3.4* 3.7  CL 105 102 104  CO2 26 24 24   GLUCOSE 148* 101* 113*  BUN 19 9 12   CREATININE 0.87 0.85 1.12  CALCIUM 9.0 9.4 9.3   Liver Function Tests:  Recent Labs Lab 03/05/16 1837 03/06/16 0846  AST 36 33  ALT 25 23  ALKPHOS 99 110  BILITOT 4.2* 4.2*  PROT 8.1 8.0  ALBUMIN 4.3 4.5   No results for input(s): LIPASE, AMYLASE in the last 168 hours. No results for input(s): AMMONIA in the last 168 hours. CBC:  Recent Labs Lab 03/01/16 1102 03/05/16 1837 03/06/16 0846 03/07/16 0535 03/08/16 0624  WBC 12.9* 20.0* 21.6* 20.6* 17.4*  NEUTROABS 8.7* 14.4* 16.2* 15.8* 10.6*  HGB 7.6* 7.4* 7.8* 6.0* 7.0*  HCT 22.1* 22.4* 22.9* 18.2* 20.6*  MCV 87.4 88.5 89.8 92.4 92.8  PLT 412* 619* 641* 496* 553*   Cardiac Enzymes: No results for input(s): CKTOTAL, CKMB, CKMBINDEX, TROPONINI in the last 168 hours. BNP (last 3 results)  Recent Labs  11/28/15 0520 02/11/16 1006 03/07/16 0535  BNP 422.5* 381.0* 319.6*    ProBNP (last 3 results) No results for input(s): PROBNP in the last 8760 hours.  CBG: No results for input(s): GLUCAP in the last 168 hours.  No results found for this or any previous visit (from the past 240 hour(s)).   Studies: Dg Chest 2 View  Result Date: 03/06/2016 CLINICAL DATA:   36 year old male with right rib pain and sickle cell crisis. Initial encounter. EXAM: CHEST  2 VIEW COMPARISON:  02/29/2016 and earlier. FINDINGS: Stable cardiomegaly and mediastinal contours. Stable left chest power port, currently accessed. Interval resolved patchy and nodular right upper lung opacity. No pneumothorax, pulmonary edema, pleural effusion or acute pulmonary opacity. Stable basilar predominant mild chronic increased interstitial markings. Negative visible bowel gas pattern. Stable cholecystectomy clips. No acute osseous abnormality identified. IMPRESSION: Interval resolved right upper lobe opacity. Stable cardiomegaly with no acute cardiopulmonary abnormality. Electronically Signed   By: Genevie Ann M.D.   On: 03/06/2016 10:46   Dg Chest 2 View  Result Date: 02/29/2016 CLINICAL DATA:  Hypoxia and weakness.  Sickle cell. EXAM: CHEST  2 VIEW  COMPARISON:  02/23/2016. FINDINGS: Trachea is midline. Left subclavian power port tip is in the high right atrium. Heart is enlarged, stable. Mild patchy right upper lobe airspace opacification. Streaky atelectasis or scarring in the left lower lobe. No pleural fluid. IMPRESSION: Mild right upper lobe patchy airspace opacification. Electronically Signed   By: Lorin Picket M.D.   On: 02/29/2016 12:53   Dg Chest 2 View  Result Date: 02/23/2016 CLINICAL DATA:  SICKLE CELL CRISIS EXAM: CHEST  2 VIEW COMPARISON:  CT 02/21/2016 FINDINGS: LEFT-SIDED POWER PORT UNCHANGED. STABLE CARDIAC SILHOUETTE. THERE IS MILD CENTRAL VENOUS CONGESTION. NO FOCAL INFILTRATE. NO PLEURAL FLUID. NO PNEUMOTHORAX. IMPRESSION: 1. STABLE CARDIOMEGALY. 2. STABLE CENTRAL VENOUS PULMONARY CONGESTION. 3. NO EVIDENCE OF PULMONARY INFILTRATE Electronically Signed   By: Suzy Bouchard M.D.   On: 02/23/2016 16:35   Dg Chest 2 View  Result Date: 02/21/2016 CLINICAL DATA:  New onset chest pain beginning today. EXAM: CHEST  2 VIEW COMPARISON:  02/19/2016 FINDINGS: Chronic enlargement of the  cardiac silhouette. Power port tip in the SVC just above the right atrium as seen previously. Pulmonary venous hypertension, possibly with early interstitial edema. No advanced edema. No consolidation or collapse. No effusions. IMPRESSION: Chronic cardiomegaly. Worsening of the pulmonary vascular pattern suggesting venous hypertension with early interstitial edema. Electronically Signed   By: Nelson Chimes M.D.   On: 02/21/2016 07:49   Dg Chest 2 View  Result Date: 02/19/2016 CLINICAL DATA:  Sickle cell pain crisis, worsening bilat lower chest pain since yesterday EXAM: CHEST  2 VIEW COMPARISON:  02/11/2016 FINDINGS: Left Port-A-Cath is unchanged in position. Midline trachea. Moderate cardiomegaly. Mediastinal contours otherwise within normal limits. No pleural effusion or pneumothorax. No congestive failure. Lower lobe predominant pulmonary interstitial thickening is chronic and nonspecific. No lobar consolidation. IMPRESSION: Cardiomegaly with chronic interstitial thickening. No acute findings. Electronically Signed   By: Abigail Miyamoto M.D.   On: 02/19/2016 07:18   Ct Angio Chest Pe W And/or Wo Contrast  Result Date: 03/06/2016 CLINICAL DATA:  Chest pain, new hypoxia.  Sickle cell disease. EXAM: CT ANGIOGRAPHY CHEST WITH CONTRAST TECHNIQUE: Multidetector CT imaging of the chest was performed using the standard protocol during bolus administration of intravenous contrast. Multiplanar CT image reconstructions and MIPs were obtained to evaluate the vascular anatomy. CONTRAST:  100 cc Isovue 370 Additional 80 cc Isovue 370 given due to port leakage and patient motion. COMPARISON:  Chest CT angiogram dated 02/21/2016. FINDINGS: Cardiovascular: Despite repeat imaging, the peripheral segmental and subsegmental pulmonary arteries to the lower lobes are difficult to definitively characterize due to patient motion artifact and suboptimal contrast opacification. There is, however, no pulmonary embolism identified within  the main, lobar or central segmental pulmonary arteries bilaterally. Patient has had chronic lower lobe subsegmental pulmonary emboli described on previous exams, not able to be delineated again on today's study due to the patient motion artifact and suboptimal contrast opacification. Thoracic aorta is normal in caliber and configuration. Heart is prominently enlarged, stable compared to the previous exam. No pericardial effusion seen. Mediastinum/Nodes: Numerous small and moderate-sized lymph nodes within the anterior mediastinum are stable. Normal residual thymic tissue is also noted within the anterior mediastinum. Esophagus appears normal. Trachea and central bronchi are unremarkable. Lungs/Pleura: Mild bibasilar scarring/atelectasis is stable. Lungs otherwise clear. No evidence of pneumonia. No pleural effusion or pneumothorax. Upper Abdomen: Limited images of the upper abdomen are unremarkable. Musculoskeletal: No acute or suspicious osseous lesion. Superficial soft tissues are unremarkable. Review of the MIP images confirms the  above findings. IMPRESSION: 1. No acute pulmonary embolus identified, with mild study limitations detailed above. 2. Stable cardiomegaly. 3. No pneumonia or pulmonary edema. Stable mild scarring/fibrosis at the lung bases. 4. Stable mediastinal lymphadenopathy. Electronically Signed   By: Franki Cabot M.D.   On: 03/06/2016 13:30   Ct Angio Chest Pe W Or Wo Contrast  Result Date: 02/21/2016 CLINICAL DATA:  Sickle cell disease.  Chest pain, bilateral rib pain EXAM: CT ANGIOGRAPHY CHEST WITH CONTRAST TECHNIQUE: Multidetector CT imaging of the chest was performed using the standard protocol during bolus administration of intravenous contrast. Multiplanar CT image reconstructions and MIPs were obtained to evaluate the vascular anatomy. CONTRAST:  100 mL Isovue 370 COMPARISON:  01/05/2016, 11/10/2015 FINDINGS: Cardiovascular: Satisfactory opacification of the central and lobar pulmonary  arteries with somewhat suboptimal opacification of the subsegmental pulmonary arteries. Chronic bilateral lower lobe subsegmental occlusive pulmonary emboli. No acute pulmonary embolus. Main pulmonary artery is enlarged measuring 4.2 cm in diameter. Right heart strain (RV/ LV equals 1.0). Severe cardiomegaly. Trace pericardial effusion. Thoracic aorta is normal in caliber. No thoracic aortic dissection or aneurysm. Mediastinum/Nodes: Mild mediastinal lymphadenopathy is unchanged compared with multiple prior examinations. Left-sided Port-A-Cath with the tip at the cavoatrial junction. Normal esophagus. Normal thyroid. Lungs/Pleura: No pleural effusion or pneumothorax. Bilateral patchy areas of ground-glass opacity in the upper and lower lobes both central and peripheral distribution similar in appearance to the prior exam. No focal consolidation, pleural effusion or pneumothorax. Upper Abdomen: Involuted and calcified spleen consistent with history of sickle cell disease. No acute upper abdominal abnormality. Musculoskeletal: No acute osseous abnormality. 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 Review of the MIP images confirms the above findings. IMPRESSION: 1. No acute pulmonary embolus. Chronic bilateral lower lobe subsegmental pulmonary emboli unchanged in the prior exam. Chronic right heart strain with dilatation of the main pulmonary artery as can be seen with pulmonary arterial hypertension. 2. Stable cardiomegaly. 3. Patchy ground-glass opacities bilaterally suggesting mild pulmonary edema versus an infectious or inflammatory etiology. 4. Stable mediastinal lymphadenopathy. Electronically Signed   By: Kathreen Devoid   On: 02/21/2016 10:46   US Renal  Result Date: 02/11/2016 CLINICAL DATA:  Renal failure.  Sickle cell disease EXAM: RENAL / URINARY TRACT ULTRASOUND COMPLETE COMPARISON:  CT abdomen and pelvis December 15, 2015 FINDINGS: Right Kidney: Length: 11.5  cm. Echogenicity and renal cortical thickness are within normal limits. No mass, perinephric fluid, or hydronephrosis visualized. No sonographically demonstrable calculus or ureterectasis. Left Kidney: Length: 11.7 cm. Echogenicity and renal cortical thickness are within normal limits. No mass, perinephric fluid, or hydronephrosis visualized. No sonographically demonstrable calculus or ureterectasis. Bladder: Appears normal for degree of bladder distention. IMPRESSION: Study within normal limits. Electronically Signed   By: Lowella Grip III M.D.   On: 02/11/2016 15:20   Dg Chest Portable 1 View  Result Date: 02/11/2016 CLINICAL DATA:  Sickle cell anemia.  Hypoxia. EXAM: PORTABLE CHEST 1 VIEW COMPARISON:  01/31/2016. FINDINGS: PowerPort catheter with lead tip projected over right atrium. Cardiomegaly with normal pulmonary vascularity. No focal infiltrate. No pleural effusion or pneumothorax. IMPRESSION: 1. PowerPort catheter noted with lead tip projected over right atrium. 2. Cardiomegaly with pulmonary vascular prominence and bilateral interstitial prominence consistent with mild congestive heart failure. Electronically Signed   By: [place]   On: 02/11/2016 10:22    Scheduled Meds: . ambrisentan  5 mg Oral Daily  . aspirin  81 mg Oral Daily  . cholecalciferol  2,000 Units  Oral Daily  . Deferasirox  1,080 mg Oral Daily  . folic acid  1 mg Oral Daily  . gabapentin  300 mg Oral TID  . HYDROmorphone   Intravenous Q4H  . HYDROmorphone  4 mg Oral Q4H  . hydroxyurea  1,000 mg Oral Daily  . morphine  30 mg Oral Q12H  . rivaroxaban  20 mg Oral QHS   Continuous Infusions: . sodium chloride 1,000 mL (03/07/16 1451)    Active Problems:   Hypoxia    In excess of 35 minutes spent during this visit. Greater than 50% involved face to face contact with the patient for assessment, counseling and coordination of care.

## 2016-03-09 ENCOUNTER — Ambulatory Visit: Payer: Self-pay | Admitting: Family Medicine

## 2016-03-09 DIAGNOSIS — I279 Pulmonary heart disease, unspecified: Secondary | ICD-10-CM

## 2016-03-09 LAB — BASIC METABOLIC PANEL
Anion gap: 6 (ref 5–15)
BUN: 11 mg/dL (ref 6–20)
CALCIUM: 8.5 mg/dL — AB (ref 8.9–10.3)
CO2: 25 mmol/L (ref 22–32)
Chloride: 107 mmol/L (ref 101–111)
Creatinine, Ser: 0.92 mg/dL (ref 0.61–1.24)
GFR calc Af Amer: >60 mL/min (ref >60)
GLUCOSE: 121 mg/dL — AB (ref 65–99)
Potassium: 3.4 mmol/L — ABNORMAL LOW (ref 3.5–5.1)
Sodium: 138 mmol/L (ref 135–145)

## 2016-03-09 MED ORDER — HEPARIN SOD (PORK) LOCK FLUSH 100 UNIT/ML IV SOLN
500.0000 [IU] | Freq: Once | INTRAVENOUS | Status: AC
  Administered 2016-03-09: 500 [IU] via INTRAVENOUS
  Filled 2016-03-09: qty 5

## 2016-03-09 NOTE — Care Management Note (Signed)
Case Management Note  Patient Details  Name: [name] MRN: [id number] Date of Birth: Nov 24, 1980  Subjective/Objective:   36 y.o. M admitted with Hypoxia. Pt is from home where he will return with no CM needs. [facility] provides Oxygen.                  Action/Plan:CM will sign off for now but will be available should additional discharge needs arise or disposition change.    Expected Discharge Date:  03/09/16               Expected Discharge Plan:  Home/Self Care  In-House Referral:  NA  Discharge planning Services  CM Consult  Post Acute Care Choice:  Durable Medical Equipment, Resumption of Svcs/PTA Provider Choice offered to:  Patient  DME Arranged:  Oxygen DME Agency:  [place]:  NA [place] Agency:  NA  Status of Service:  Completed, signed off  If discussed at [place] of Stay Meetings, dates discussed:    Additional Comments:  Delrae Sawyers, RN 03/09/2016, 1:25 PM

## 2016-03-09 NOTE — Progress Notes (Signed)
[name] requested not to have his PAC needle or dressing change at this time. Patient stated, "I should be discharge home today." This nurse will honor [place]'s request due to anticipated discharge today.

## 2016-03-09 NOTE — Discharge Summary (Signed)
[name] MRN: [id number] DOB/AGE: 37-Jan-1982 37 y.o.  Admit date: 03/06/2016 Discharge date: 03/09/2016  Primary Care Physician:  [name], MD   Recommendations: 1. Oxygen to be used 2 L/min ATC until re-evaluated by pulmonary and furtehr recommendations made by them. 2. Follow up with [facility] for re-evaluation (Appointment scheduled for 2/15) 3. Referral for sleep study to evaluate for OSA. 4. Make arrangements to obtain portable Oxygen tank so that patient can maintain oxygen supplementation when not at home. 5. Pt should undertake oral fluid resuscitation during crisis and should not receive IVF or at the most a very low rate of IVF due to his sensitivity with fluid overload and risk of pulmonary edema and fluid overload.     Discharge Diagnoses:   Patient Active Problem List   Diagnosis Date Noted  . Cor pulmonale, chronic ([place]) 08/25/2014    Priority: High  . Pulmonary HTN 06/18/2013    Priority: High  . Functional asplenia     Priority: High  . Anemia of chronic disease 06/25/2014    Priority: Medium  . Chronic respiratory failure with hypoxia ([place]) 03/14/2014    Priority: Medium  . Chronic anticoagulation 08/22/2013    Priority: Medium  . Avascular necrosis ([place])     Priority: Low  . Hypoxia 03/06/2016  . Slurred speech 11/10/2015  . Ischemic stroke ([place])   . Anemia 01/31/2015  . Chronic anemia   . Hb-SS disease without crisis ([place]) 10/07/2014  . Acute on chronic respiratory failure with hypoxia ([place])   . Abnormal EKG 09/10/2014  . Sickle cell anemia ([place]) 06/25/2014  . PAH (pulmonary artery hypertension) 03/18/2014  . Paralytic strabismus, external ophthalmoplegia   . Chronic pain syndrome 12/12/2013  . Essential hypertension 08/22/2013  . Vitamin D deficiency 02/13/2013  . Hx of pulmonary embolus 06/29/2012  . Hemochromatosis 12/14/2011    DISCHARGE MEDICATION: Allergies as of 03/09/2016   No Known Allergies     Medication List     TAKE these medications   ambrisentan 5 MG tablet Commonly known as:  LETAIRIS Take 5 mg by mouth daily.   aspirin 81 MG chewable tablet Chew 1 tablet (81 mg total) by mouth daily.   cholecalciferol 1000 units tablet Commonly known as:  VITAMIN D Take 2,000 Units by mouth daily.   folic acid 1 MG tablet Commonly known as:  FOLVITE Take 1 mg by mouth daily.   gabapentin 300 MG capsule Commonly known as:  NEURONTIN Take 1 capsule (300 mg total) by mouth 3 (three) times daily.   HYDROmorphone 4 MG tablet Commonly known as:  DILAUDID Take 4 mg by mouth every 4 (four) hours as needed for severe pain. What changed:  Another medication with the same name was added. Make sure you understand how and when to take each.   HYDROmorphone 4 MG tablet Commonly known as:  DILAUDID Take 1 tablet (4 mg total) by mouth every 4 (four) hours as needed for moderate pain or severe pain. What changed:  You were already taking a medication with the same name, and this prescription was added. Make sure you understand how and when to take each.   hydroxyurea 500 MG capsule Commonly known as:  HYDREA Take 2 capsules (1,000 mg total) by mouth daily. Hold this medication until CBC and reticulocytes rechecked in 1 week and then restart to be determined by Primary Provider.  May take with food to minimize GI side effects. Hold Hydrea until evaluated by Primary Provider or Hematologist.  JADENU 360 MG Tabs Generic drug:  Deferasirox Take 1,080 mg by mouth daily.   morphine 30 MG 12 hr tablet Commonly known as:  MS CONTIN Take 1 tablet (30 mg total) by mouth every 12 (twelve) hours.   potassium chloride SA 20 MEQ tablet Commonly known as:  K-DUR,KLOR-CON Take 2 tablets (40 mEq total) by mouth daily.   rivaroxaban 20 MG Tabs tablet Commonly known as:  XARELTO Take 1 tablet (20 mg total) by mouth at bedtime.   zolpidem 10 MG tablet Commonly known as:  AMBIEN Take 1 tablet (10 mg total) by mouth at  bedtime as needed for sleep.         Consults:    SIGNIFICANT DIAGNOSTIC STUDIES:  Dg Chest 2 View  Result Date: 03/06/2016 CLINICAL DATA:  36 year old male with right rib pain and sickle cell crisis. Initial encounter. EXAM: CHEST  2 VIEW COMPARISON:  02/29/2016 and earlier. FINDINGS: Stable cardiomegaly and mediastinal contours. Stable left chest power port, currently accessed. Interval resolved patchy and nodular right upper lung opacity. No pneumothorax, pulmonary edema, pleural effusion or acute pulmonary opacity. Stable basilar predominant mild chronic increased interstitial markings. Negative visible bowel gas pattern. Stable cholecystectomy clips. No acute osseous abnormality identified. IMPRESSION: Interval resolved right upper lobe opacity. Stable cardiomegaly with no acute cardiopulmonary abnormality. Electronically Signed   By: Genevie Ann M.D.   On: 03/06/2016 10:46   Dg Chest 2 View  Result Date: 02/29/2016 CLINICAL DATA:  Hypoxia and weakness.  Sickle cell. EXAM: CHEST  2 VIEW COMPARISON:  02/23/2016. FINDINGS: Trachea is midline. Left subclavian power port tip is in the high right atrium. Heart is enlarged, stable. Mild patchy right upper lobe airspace opacification. Streaky atelectasis or scarring in the left lower lobe. No pleural fluid. IMPRESSION: Mild right upper lobe patchy airspace opacification. Electronically Signed   By: Lorin Picket M.D.   On: 02/29/2016 12:53   Dg Chest 2 View  Result Date: 02/23/2016 CLINICAL DATA:  SICKLE CELL CRISIS EXAM: CHEST  2 VIEW COMPARISON:  CT 02/21/2016 FINDINGS: LEFT-SIDED POWER PORT UNCHANGED. STABLE CARDIAC SILHOUETTE. THERE IS MILD CENTRAL VENOUS CONGESTION. NO FOCAL INFILTRATE. NO PLEURAL FLUID. NO PNEUMOTHORAX. IMPRESSION: 1. STABLE CARDIOMEGALY. 2. STABLE CENTRAL VENOUS PULMONARY CONGESTION. 3. NO EVIDENCE OF PULMONARY INFILTRATE Electronically Signed   By: Suzy Bouchard M.D.   On: 02/23/2016 16:35   Dg Chest 2 View  Result  Date: 02/21/2016 CLINICAL DATA:  New onset chest pain beginning today. EXAM: CHEST  2 VIEW COMPARISON:  02/19/2016 FINDINGS: Chronic enlargement of the cardiac silhouette. Power port tip in the SVC just above the right atrium as seen previously. Pulmonary venous hypertension, possibly with early interstitial edema. No advanced edema. No consolidation or collapse. No effusions. IMPRESSION: Chronic cardiomegaly. Worsening of the pulmonary vascular pattern suggesting venous hypertension with early interstitial edema. Electronically Signed   By: Nelson Chimes M.D.   On: 02/21/2016 07:49   Dg Chest 2 View  Result Date: 02/19/2016 CLINICAL DATA:  Sickle cell pain crisis, worsening bilat lower chest pain since yesterday EXAM: CHEST  2 VIEW COMPARISON:  02/11/2016 FINDINGS: Left Port-A-Cath is unchanged in position. Midline trachea. Moderate cardiomegaly. Mediastinal contours otherwise within normal limits. No pleural effusion or pneumothorax. No congestive failure. Lower lobe predominant pulmonary interstitial thickening is chronic and nonspecific. No lobar consolidation. IMPRESSION: Cardiomegaly with chronic interstitial thickening. No acute findings. Electronically Signed   By: Abigail Miyamoto M.D.   On: 02/19/2016 07:18   Ct Angio Chest Pe W  And/or Wo Contrast  Result Date: 03/06/2016 CLINICAL DATA:  Chest pain, new hypoxia.  Sickle cell disease. EXAM: CT ANGIOGRAPHY CHEST WITH CONTRAST TECHNIQUE: Multidetector CT imaging of the chest was performed using the standard protocol during bolus administration of intravenous contrast. Multiplanar CT image reconstructions and MIPs were obtained to evaluate the vascular anatomy. CONTRAST:  100 cc Isovue 370 Additional 80 cc Isovue 370 given due to port leakage and patient motion. COMPARISON:  Chest CT angiogram dated 02/21/2016. FINDINGS: Cardiovascular: Despite repeat imaging, the peripheral segmental and subsegmental pulmonary arteries to the lower lobes are difficult to  definitively characterize due to patient motion artifact and suboptimal contrast opacification. There is, however, no pulmonary embolism identified within the main, lobar or central segmental pulmonary arteries bilaterally. Patient has had chronic lower lobe subsegmental pulmonary emboli described on previous exams, not able to be delineated again on today's study due to the patient motion artifact and suboptimal contrast opacification. Thoracic aorta is normal in caliber and configuration. Heart is prominently enlarged, stable compared to the previous exam. No pericardial effusion seen. Mediastinum/Nodes: Numerous small and moderate-sized lymph nodes within the anterior mediastinum are stable. Normal residual thymic tissue is also noted within the anterior mediastinum. Esophagus appears normal. Trachea and central bronchi are unremarkable. Lungs/Pleura: Mild bibasilar scarring/atelectasis is stable. Lungs otherwise clear. No evidence of pneumonia. No pleural effusion or pneumothorax. Upper Abdomen: Limited images of the upper abdomen are unremarkable. Musculoskeletal: No acute or suspicious osseous lesion. Superficial soft tissues are unremarkable. Review of the MIP images confirms the above findings. IMPRESSION: 1. No acute pulmonary embolus identified, with mild study limitations detailed above. 2. Stable cardiomegaly. 3. No pneumonia or pulmonary edema. Stable mild scarring/fibrosis at the lung bases. 4. Stable mediastinal lymphadenopathy. Electronically Signed   By: Franki Cabot M.D.   On: 03/06/2016 13:30   Ct Angio Chest Pe W Or Wo Contrast  Result Date: 02/21/2016 CLINICAL DATA:  Sickle cell disease.  Chest pain, bilateral rib pain EXAM: CT ANGIOGRAPHY CHEST WITH CONTRAST TECHNIQUE: Multidetector CT imaging of the chest was performed using the standard protocol during bolus administration of intravenous contrast. Multiplanar CT image reconstructions and MIPs were obtained to evaluate the vascular  anatomy. CONTRAST:  100 mL Isovue 370 COMPARISON:  01/05/2016, 11/10/2015 FINDINGS: Cardiovascular: Satisfactory opacification of the central and lobar pulmonary arteries with somewhat suboptimal opacification of the subsegmental pulmonary arteries. Chronic bilateral lower lobe subsegmental occlusive pulmonary emboli. No acute pulmonary embolus. Main pulmonary artery is enlarged measuring 4.2 cm in diameter. Right heart strain (RV/ LV equals 1.0). Severe cardiomegaly. Trace pericardial effusion. Thoracic aorta is normal in caliber. No thoracic aortic dissection or aneurysm. Mediastinum/Nodes: Mild mediastinal lymphadenopathy is unchanged compared with multiple prior examinations. Left-sided Port-A-Cath with the tip at the cavoatrial junction. Normal esophagus. Normal thyroid. Lungs/Pleura: No pleural effusion or pneumothorax. Bilateral patchy areas of ground-glass opacity in the upper and lower lobes both central and peripheral distribution similar in appearance to the prior exam. No focal consolidation, pleural effusion or pneumothorax. Upper Abdomen: Involuted and calcified spleen consistent with history of sickle cell disease. No acute upper abdominal abnormality. Musculoskeletal: No acute osseous abnormality. 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 Review of the MIP images confirms the above findings. IMPRESSION: 1. No acute pulmonary embolus. Chronic bilateral lower lobe subsegmental pulmonary emboli unchanged in the prior exam. Chronic right heart strain with dilatation of the main pulmonary artery as can be seen with pulmonary arterial hypertension. 2. Stable cardiomegaly. 3. Patchy  ground-glass opacities bilaterally suggesting mild pulmonary edema versus an infectious or inflammatory etiology. 4. Stable mediastinal lymphadenopathy. Electronically Signed   By: Kathreen Devoid   On: 02/21/2016 10:46   US Renal  Result Date: 02/11/2016 CLINICAL DATA:  Renal  failure.  Sickle cell disease EXAM: RENAL / URINARY TRACT ULTRASOUND COMPLETE COMPARISON:  CT abdomen and pelvis December 15, 2015 FINDINGS: Right Kidney: Length: 11.5 cm. Echogenicity and renal cortical thickness are within normal limits. No mass, perinephric fluid, or hydronephrosis visualized. No sonographically demonstrable calculus or ureterectasis. Left Kidney: Length: 11.7 cm. Echogenicity and renal cortical thickness are within normal limits. No mass, perinephric fluid, or hydronephrosis visualized. No sonographically demonstrable calculus or ureterectasis. Bladder: Appears normal for degree of bladder distention. IMPRESSION: Study within normal limits. Electronically Signed   By: Lowella Grip III M.D.   On: 02/11/2016 15:20   Dg Chest Portable 1 View  Result Date: 02/11/2016 CLINICAL DATA:  Sickle cell anemia.  Hypoxia. EXAM: PORTABLE CHEST 1 VIEW COMPARISON:  01/31/2016. FINDINGS: PowerPort catheter with lead tip projected over right atrium. Cardiomegaly with normal pulmonary vascularity. No focal infiltrate. No pleural effusion or pneumothorax. IMPRESSION: 1. PowerPort catheter noted with lead tip projected over right atrium. 2. Cardiomegaly with pulmonary vascular prominence and bilateral interstitial prominence consistent with mild congestive heart failure. Electronically Signed   By: Marcello Moores  Register   On: 02/11/2016 10:22       No results found for this or any previous visit (from the past 240 hour(s)).  BRIEF ADMITTING H & P: [name]  is a 36 y.o. male with history of sickle cell disease with frequent crises and multiple complications as listed below, came to the ED today with complaints of bilateral rib pain that he associates with a sickle cell crisis. He started having pain in his rib cage bilaterally about 2 days ago. The pain is sharp, throbbing and constant in nature. He denies any trouble with cough or shortness of breath. At baseline his oxygen saturation is in the  low 90s. He does have supplemental oxygen to use at home but not adherent. He was also in the ED yesterday for similar complaint but was able to maintain his saturation throughout ED stay. However today, while he was being evaluated for discharge from the ED, his pulse oxy was noted to be in the 60s and only improved to upper 80s and low 90s with non-rebreather oxygen. He has had multiple complications including Acute Chest Syndrome and Pulmonary Embolism.   Patient is being admitted for Hypoxia and Sickle Cell Pain Crisis.   Hospital Course:  Present on Admission: . Hypoxia  Pt was admitted with hypoxia which is felt to be due to Secondary Pulmonary Hypertension and a transient right to left shunt associated with hypoxia at night. The condition was further compromised by IVF fluid administration. The IVF were discontinued and lasix given for diuresis. Pulmonology was consulted and agreed that treatment should include continuous Oxygen and diuresis to achieve dry weight of close to 75 KG while monitoring renal function. Pt had a recent ECHO (11/11/2015) which showed Right to Left shunt suggestive of PFO. He needs re-evaluation and may require adjustment of his medication for pulmonary hypertension. He has an appointment with Dr. Cleopatra Cedar on 03/17/2016 for follow up. At the time of discharge he is requiring 2l/min of Oxygen and is to use it ATC. He does not have a portable tank at home. Dr.Jegede is aware of the issue and will address this in the  ambulatory setting with patient. Pt may also have some obstructive sleep apnea as the reports from his mother is that he seem to temporarily stop breathing at night while sleeping.  With regard to his Hb SS patient felt that he was having pain but not in crisis. He was placed on his usual home medications including MS Contin and Oxycodone. He was treated with Toradol and IVF were discontinued owing to the above. He continued to have baseline pain and is  discharged home on his pre-hospital medications.   Pt has anemia of chronic disease and at the time of discharge from his last hospitalization, his Hydrea was held. His bone marrow however has recovered and indices were favorable for resuming Hydrea. Hydrea is resumed at previous dose and at the time of discharge, his Hb is at baseline of 7 g/dl, Hct 20.6% and reticulocytes 17% with an RPI of 3.9. He is continued on Hydrea at discharge.  Pt has a chronic Leukocytosis with baseline WBC of 17-19.   Pt was continued on Jadenu for secondary Hemachromatosis, Xarelto for chronic anticoagulation due to recurrent pulmonary emboli, and Ambrisentan for Pulmonary Arterial Hypertension.  Disposition and Follow-up: Pt discharged home in good condition. He is to follow up with his PMD on 03/15/2916 at 11:00am and with [facility] on 03/17/2016 at 1:30pm.   DISCHARGE EXAM:  General: Alert, awake, oriented x3, in no apparent distress. Well appearing. HEENT: [place]/AT PEERL, EOMI, anicteric Neck: Trachea midline, no masses, no thyromegal,y no JVD, no carotid bruit OROPHARYNX: Moist, No exudate/ erythema/lesions.  Heart: Regular rate and rhythm, pt has a loud P2. No rubs, gallops or S3. PMI non-displaced. Exam reveals no decreased pulses. Pulmonary/Chest: Normal effort. Breath sounds normal. No. Apnea. Clear to auscultation,no stridor,  no wheezing and no rhonchi noted. No respiratory distress and no tenderness noted. Abdomen: Soft, nontender, nondistended, normal bowel sounds, no masses no hepatosplenomegaly noted. No fluid wave and no ascites. There is no guarding or rebound. Neuro: Alert and oriented to person, place and time. Normal motor skills, Displays no atrophy or tremors and exhibits normal muscle tone.  No focal neurological deficits noted cranial nerves II through XII grossly intact. No sensory deficit noted.  Strength at baseline in bilateral upper and lower extremities. Gait  normal. Musculoskeletal: No warmth swelling or erythema around joints, no spinal tenderness noted. Psychiatric: Patient alert and oriented x3, good insight and cognition, good recent to remote recall. Mood, affect and judgement normal Skin: Skin is warm and dry. No bruising, no ecchymosis and no rash noted. Pt is not diaphoretic. No erythema. No pallor   Blood pressure 118/74, pulse 89, temperature 97.7 F (36.5 C), temperature source Oral, resp. rate 18, height 6' (1.829 m), weight 84.4 kg (186 lb 1.1 oz), SpO2 94 %.   Recent Labs  03/08/16 0624 03/09/16 0700  NA 137 138  K 3.8 3.4*  CL 109 107  CO2 24 25  GLUCOSE 122* 121*  BUN 10 11  CREATININE 0.79 0.92  CALCIUM 8.5* 8.5*   No results for input(s): AST, ALT, ALKPHOS, BILITOT, PROT, ALBUMIN in the last 72 hours. No results for input(s): LIPASE, AMYLASE in the last 72 hours.  Recent Labs  03/07/16 0535 03/08/16 0624  WBC 20.6* 17.4*  NEUTROABS 15.8* 10.6*  HGB 6.0* 7.0*  HCT 18.2* 20.6*  MCV 92.4 92.8  PLT 496* 553*     Total time spent including face to face and decision making was greater than 30 minutes  Signed: Jourdyn [name]  A. 03/09/2016, 10:26 AM

## 2016-03-11 ENCOUNTER — Emergency Department (HOSPITAL_COMMUNITY)
Admission: EM | Admit: 2016-03-11 | Discharge: 2016-03-11 | Disposition: A | Discharge status: Home / Self Care | Payer: Medicare Other | Source: Home / Self Care | Attending: Emergency Medicine | Admitting: Emergency Medicine

## 2016-03-11 ENCOUNTER — Emergency Department (HOSPITAL_COMMUNITY): Payer: Medicare Other

## 2016-03-11 DIAGNOSIS — Z9119 Patient's noncompliance with other medical treatment and regimen: Secondary | ICD-10-CM | POA: Diagnosis not present

## 2016-03-11 DIAGNOSIS — I2729 Other secondary pulmonary hypertension: Secondary | ICD-10-CM | POA: Diagnosis not present

## 2016-03-11 DIAGNOSIS — Z8673 Personal history of transient ischemic attack (TIA), and cerebral infarction without residual deficits: Secondary | ICD-10-CM | POA: Insufficient documentation

## 2016-03-11 DIAGNOSIS — D72829 Elevated white blood cell count, unspecified: Secondary | ICD-10-CM | POA: Diagnosis present

## 2016-03-11 DIAGNOSIS — Z818 Family history of other mental and behavioral disorders: Secondary | ICD-10-CM | POA: Diagnosis not present

## 2016-03-11 DIAGNOSIS — R000 Tachycardia, unspecified: Secondary | ICD-10-CM | POA: Diagnosis not present

## 2016-03-11 DIAGNOSIS — Z86711 Personal history of pulmonary embolism: Secondary | ICD-10-CM | POA: Diagnosis not present

## 2016-03-11 DIAGNOSIS — Z7901 Long term (current) use of anticoagulants: Secondary | ICD-10-CM

## 2016-03-11 DIAGNOSIS — I4581 Long QT syndrome: Secondary | ICD-10-CM | POA: Diagnosis present

## 2016-03-11 DIAGNOSIS — J9621 Acute and chronic respiratory failure with hypoxia: Secondary | ICD-10-CM | POA: Diagnosis present

## 2016-03-11 DIAGNOSIS — Z79899 Other long term (current) drug therapy: Secondary | ICD-10-CM | POA: Insufficient documentation

## 2016-03-11 DIAGNOSIS — R0789 Other chest pain: Secondary | ICD-10-CM | POA: Diagnosis not present

## 2016-03-11 DIAGNOSIS — R079 Chest pain, unspecified: Secondary | ICD-10-CM | POA: Diagnosis not present

## 2016-03-11 DIAGNOSIS — E875 Hyperkalemia: Secondary | ICD-10-CM | POA: Diagnosis not present

## 2016-03-11 DIAGNOSIS — Z9981 Dependence on supplemental oxygen: Secondary | ICD-10-CM | POA: Diagnosis not present

## 2016-03-11 DIAGNOSIS — Z7982 Long term (current) use of aspirin: Secondary | ICD-10-CM

## 2016-03-11 DIAGNOSIS — Z833 Family history of diabetes mellitus: Secondary | ICD-10-CM | POA: Diagnosis not present

## 2016-03-11 DIAGNOSIS — Z87891 Personal history of nicotine dependence: Secondary | ICD-10-CM

## 2016-03-11 DIAGNOSIS — D5701 Hb-SS disease with acute chest syndrome: Secondary | ICD-10-CM | POA: Diagnosis not present

## 2016-03-11 DIAGNOSIS — I10 Essential (primary) hypertension: Secondary | ICD-10-CM

## 2016-03-11 DIAGNOSIS — D5700 Hb-SS disease with crisis, unspecified: Secondary | ICD-10-CM | POA: Diagnosis not present

## 2016-03-11 DIAGNOSIS — Z96641 Presence of right artificial hip joint: Secondary | ICD-10-CM | POA: Diagnosis present

## 2016-03-11 LAB — RETICULOCYTES
RBC.: 2.32 MIL/uL — AB (ref 4.22–5.81)
RETIC CT PCT: 10.6 % — AB (ref 0.4–3.1)
Retic Count, Absolute: 245.9 10*3/uL — ABNORMAL HIGH (ref 19.0–186.0)

## 2016-03-11 LAB — CBC WITH DIFFERENTIAL/PLATELET
BASOS ABS: 0.1 10*3/uL (ref 0.0–0.1)
Basophils Relative: 1 %
Eosinophils Absolute: 0.3 10*3/uL (ref 0.0–0.7)
Eosinophils Relative: 2 %
HCT: 21 % — ABNORMAL LOW (ref 39.0–52.0)
Hemoglobin: 7.3 g/dL — ABNORMAL LOW (ref 13.0–17.0)
LYMPHS ABS: 1.1 10*3/uL (ref 0.7–4.0)
Lymphocytes Relative: 8 %
MCH: 31.5 pg (ref 26.0–34.0)
MCHC: 34.8 g/dL (ref 30.0–36.0)
MCV: 90.5 fL (ref 78.0–100.0)
MONO ABS: 1.7 10*3/uL — AB (ref 0.1–1.0)
Monocytes Relative: 12 %
NEUTROS ABS: 11.1 10*3/uL — AB (ref 1.7–7.7)
Neutrophils Relative %: 77 %
Platelets: 528 10*3/uL — ABNORMAL HIGH (ref 150–400)
RBC: 2.32 MIL/uL — AB (ref 4.22–5.81)
RDW: 21.4 % — AB (ref 11.5–15.5)
WBC: 14.3 10*3/uL — AB (ref 4.0–10.5)

## 2016-03-11 LAB — COMPREHENSIVE METABOLIC PANEL
ALBUMIN: 4.2 g/dL (ref 3.5–5.0)
ALK PHOS: 85 U/L (ref 38–126)
ALT: 30 U/L (ref 17–63)
ANION GAP: 7 (ref 5–15)
AST: 41 U/L (ref 15–41)
BILIRUBIN TOTAL: 4.1 mg/dL — AB (ref 0.3–1.2)
BUN: 8 mg/dL (ref 6–20)
CALCIUM: 9.3 mg/dL (ref 8.9–10.3)
CO2: 23 mmol/L (ref 22–32)
Chloride: 107 mmol/L (ref 101–111)
Creatinine, Ser: 0.88 mg/dL (ref 0.61–1.24)
GFR calc Af Amer: >60 mL/min (ref >60)
GLUCOSE: 95 mg/dL (ref 65–99)
POTASSIUM: 3.8 mmol/L (ref 3.5–5.1)
Sodium: 137 mmol/L (ref 135–145)
TOTAL PROTEIN: 7.5 g/dL (ref 6.5–8.1)

## 2016-03-11 LAB — I-STAT TROPONIN, ED: TROPONIN I, POC: 0.02 ng/mL (ref 0.00–0.08)

## 2016-03-11 MED ORDER — OXYCODONE-ACETAMINOPHEN 5-325 MG PO TABS
2.0000 | ORAL_TABLET | Freq: Once | ORAL | Status: AC
  Administered 2016-03-11: 2 via ORAL
  Filled 2016-03-11: qty 2

## 2016-03-11 MED ORDER — HYDROMORPHONE HCL 2 MG/ML IJ SOLN: 2.0000 mg | INTRAMUSCULAR | Status: AC

## 2016-03-11 MED ORDER — HYDROMORPHONE HCL 2 MG/ML IJ SOLN
2.0000 mg | INTRAMUSCULAR | Status: AC
  Administered 2016-03-11: 2 mg via INTRAVENOUS
  Filled 2016-03-11: qty 1

## 2016-03-11 MED ORDER — DIPHENHYDRAMINE HCL 50 MG/ML IJ SOLN
25.0000 mg | Freq: Once | INTRAMUSCULAR | Status: AC
  Administered 2016-03-11: 25 mg via INTRAVENOUS

## 2016-03-11 MED ORDER — KETOROLAC TROMETHAMINE 30 MG/ML IJ SOLN
30.0000 mg | INTRAMUSCULAR | Status: AC
  Administered 2016-03-11: 30 mg via INTRAVENOUS
  Filled 2016-03-11: qty 1

## 2016-03-11 MED ORDER — HEPARIN SOD (PORK) LOCK FLUSH 100 UNIT/ML IV SOLN
500.0000 [IU] | Freq: Once | INTRAVENOUS | Status: AC
  Administered 2016-03-11: 500 [IU]
  Filled 2016-03-11: qty 5

## 2016-03-11 MED ORDER — DIPHENHYDRAMINE HCL 50 MG/ML IJ SOLN
INTRAMUSCULAR | Status: AC
  Administered 2016-03-11: 25 mg via INTRAVENOUS
  Filled 2016-03-11: qty 1

## 2016-03-11 MED ORDER — HYDROMORPHONE HCL 2 MG/ML IJ SOLN
2.0000 mg | INTRAMUSCULAR | Status: AC
Start: 2016-03-11 — End: 2016-03-11
  Administered 2016-03-11: 2 mg via INTRAVENOUS
  Filled 2016-03-11: qty 1

## 2016-03-11 NOTE — ED Triage Notes (Signed)
Pt reports bilateral "side pain" starting this am. Pt denies N/V at this time.

## 2016-03-11 NOTE — Care Management (Signed)
ED CM noted Patient have had 21 ED visits and 6 resulting in hospital stays. Hx SCD with frequent crises and multiple complications, recently place on continuous  home oxygen supplied by [facility].  He is followed at the [facility] by Dr. Doreene Burke. ED CM met with patient at bedside he reported that he contacted the [facility] concerning medication refill, and he planned to pick up his prescription today but began to have increased bilateral side pain so presented to ED. ED CM called [facility] to verify prescription refills. Prescriptions are ready for pick up upon discharge from the ED. Updated patient and Dr. Lita Mains

## 2016-03-11 NOTE — ED Provider Notes (Signed)
[facility] DEPT Provider Note   CSN: [id number] Arrival date & time: 03/11/16  0718     History   Chief Complaint Chief Complaint  Patient presents with  . Flank Pain    HPI [name] is a 36 y.o. male.  HPI Patient was recently measured the hospital for similar bilateral chest wall pain. Started on home oxygen. Patient states using oxygen and pain medication as prescribed at home. States he's had ongoing bilateral chest wall pain. Pain is worse with deep breathing. Denies any new shortness of breath, fever or chills. Denies lower extremity swelling or pain. Past Medical History:  Diagnosis Date  . Acute chest syndrome ([place]) 06/18/2013  . Acute embolism and thrombosis of right internal jugular vein ([place])   . Alcohol consumption of one to four drinks per day   . Avascular necrosis (HCC)    Right Hip  . Blood transfusion   . Chronic anticoagulation   . Demand ischemia ([place]) 01/02/2014  . Former smoker   . Functional asplenia   . Hb-SS disease with crisis ([place])   . History of Clostridium difficile infection   . History of pulmonary embolus (PE)   . Hypertension   . Hypokalemia   . Leukocytosis    Chronic  . Mood disorder ([place])   . Noncompliance with medication regimen   . Oxygen deficiency   . Pulmonary hypertension   . Second hand tobacco smoke exposure   . Sickle cell anemia (HCC)   . Sickle-cell crisis with associated acute chest syndrome ([place]) 05/13/2013  . Stroke ([place])   . Thrombocytosis (HCC)    Chronic  . Uses marijuana     Patient Active Problem List   Diagnosis Date Noted  . Hypoxia 03/06/2016  . Slurred speech 11/10/2015  . Ischemic stroke ([place])   . Anemia 01/31/2015  . Chronic anemia   . 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 Acute on chronic respiratory failure with hypoxia ([place])   . Abnormal EKG 09/10/2014  . Cor pulmonale, chronic ([place]) 08/25/2014  . Sickle cell anemia ([place]) 06/25/2014  . Anemia of chronic disease 06/25/2014   . PAH (pulmonary artery hypertension) 03/18/2014  . Chronic respiratory failure with hypoxia ([place]) 03/14/2014  . Paralytic strabismus, external ophthalmoplegia   . Chronic pain syndrome 12/12/2013  . Chronic anticoagulation 08/22/2013  . Essential hypertension 08/22/2013  . Pulmonary HTN 06/18/2013  . Functional asplenia   . Vitamin D deficiency 02/13/2013  . Hx of pulmonary embolus 06/29/2012  . Hemochromatosis 12/14/2011  . Avascular necrosis [facility])     Past Surgical History:  Procedure Laterality Date  . CHOLECYSTECTOMY     01/2008  . Excision of left periauricular cyst     10/2009  . Excision of right ear lobe cyst with primary closur     11/2007  . Porta cath placement    . Porta cath removal    . PORTACATH PLACEMENT  01/05/2012   Procedure: INSERTION PORT-A-CATH;  Surgeon: Odis Hollingshead, MD;  Location: [place];  Service: General;  Laterality: N/A;  ultrasound guiced port a cath insertion with fluoroscopy  . Right hip replacement     08/2006  . UMBILICAL HERNIA REPAIR     01/2008       Home Medications    Prior to Admission medications   Medication Sig Start Date End Date Taking? Authorizing Provider  ambrisentan (LETAIRIS) 5 MG tablet Take 5 mg by mouth daily.    Yes Historical Provider, MD  aspirin 81 MG chewable tablet Chew 1 tablet (81 mg total) by mouth daily. 07/23/14  Yes [name], MD  cholecalciferol (VITAMIN D) 1000 units tablet Take 2,000 Units by mouth daily.   Yes Historical Provider, MD  Deferasirox (JADENU) 360 MG TABS Take 1,080 mg by mouth daily.    Yes Historical Provider, MD  folic acid (FOLVITE) 1 MG tablet Take 1 mg by mouth daily.   Yes Historical Provider, MD  gabapentin (NEURONTIN) 300 MG capsule Take 1 capsule (300 mg total) by mouth 3 (three) times daily. 06/01/15  Yes Tresa Garter, MD  HYDROmorphone (DILAUDID) 4 MG tablet Take 1 tablet (4 mg total) by mouth every 4 (four) hours as needed for moderate pain or severe pain.  03/08/16 03/23/16 Yes Tresa Garter, MD  hydroxyurea (HYDREA) 500 MG capsule Take 2 capsules (1,000 mg total) by mouth daily. Hold this medication until CBC and reticulocytes rechecked in 1 week and then restart to be determined by Primary Provider.  May take with food to minimize GI side effects. Hold Hydrea until evaluated by Primary Provider or Hematologist. 03/01/16  Yes [name], MD  morphine (MS CONTIN) 30 MG 12 hr tablet Take 1 tablet (30 mg total) by mouth every 12 (twelve) hours. 03/08/16 04/07/16 Yes Tresa Garter, MD  potassium chloride SA (K-DUR,KLOR-CON) 20 MEQ tablet Take 2 tablets (40 mEq total) by mouth daily. 12/23/15  Yes [name], MD  rivaroxaban (XARELTO) 20 MG TABS tablet Take 1 tablet (20 mg total) by mouth at bedtime. 12/29/15  Yes Tresa Garter, MD  zolpidem (AMBIEN) 10 MG tablet Take 1 tablet (10 mg total) by mouth at bedtime as needed for sleep. 03/08/16 04/07/16 Yes Tresa Garter, MD    Family History Family History  Problem Relation Age of Onset  . Sickle cell trait Mother   . Depression Mother   . Diabetes Mother   . Sickle cell trait Father   . Sickle cell trait Brother     Social History Social History  Substance Use Topics  . Smoking status: Former Smoker    Packs/day: 0.50    Years: 10.00    Types: Cigarettes    Quit date: 05/29/2011  . Smokeless tobacco: Never Used  . Alcohol use No     Allergies   Patient has no known allergies.   Review of Systems Review of Systems  Constitutional: Negative for chills, fatigue and fever.  HENT: Negative for facial swelling, sore throat and trouble swallowing.   Respiratory: Negative for cough, shortness of breath and wheezing.   Cardiovascular: Positive for chest pain. Negative for palpitations and leg swelling.  Gastrointestinal: Negative for abdominal pain, nausea and vomiting.  Genitourinary: Negative for dysuria, flank pain and frequency.  Musculoskeletal: Negative  for back pain, joint swelling, myalgias and neck pain.  Skin: Negative for rash.  Neurological: Negative for dizziness, weakness, light-headedness, numbness and headaches.  All other systems reviewed and are negative.    Physical Exam Updated Vital Signs BP 111/80   Pulse 70   Temp 98.7 F (37.1 C) (Oral)   Resp 16   Wt 178 lb 4 oz (80.9 kg)   SpO2 95%   BMI 24.18 kg/m   Physical Exam  Constitutional: He is oriented to person, place, and time. He appears well-developed and well-nourished. No distress.  HENT:  Head: Normocephalic and atraumatic.  Mouth/Throat: Oropharynx is clear and moist. No oropharyngeal exudate.  Eyes: EOM are normal. Pupils are  equal, round, and reactive to light. Scleral icterus is present.  Neck: Normal range of motion. Neck supple. No JVD present.  Cardiovascular: Normal rate and regular rhythm.  Exam reveals no gallop and no friction rub.   No murmur heard. Pulmonary/Chest: Effort normal and breath sounds normal. No respiratory distress. He has no wheezes. He has no rales. He exhibits tenderness (tenderness to palpation bilateral inferior chest wall.).  Abdominal: Soft. Bowel sounds are normal. There is no tenderness. There is no rebound and no guarding.  Musculoskeletal: Normal range of motion. He exhibits no edema or tenderness.  No lower extremity swelling, asymmetry or tenderness. Distal pulses intact. No midline thoracic or lumbar tenderness.  Neurological: He is alert and oriented to person, place, and time.  5/5 motor in all extremities. Sensation fully intact.  Skin: Skin is warm and dry. Capillary refill takes less than 2 seconds. No rash noted. No erythema.  Psychiatric: He has a normal mood and affect. His behavior is normal.  Nursing note and vitals reviewed.    ED Treatments / Results  Labs (all labs ordered are listed, but only abnormal results are displayed) Labs Reviewed  CBC WITH DIFFERENTIAL/PLATELET - Abnormal; Notable for the  following:       Result Value   WBC 14.3 (*)    RBC 2.32 (*)    Hemoglobin 7.3 (*)    HCT 21.0 (*)    RDW 21.4 (*)    Platelets 528 (*)    Neutro Abs 11.1 (*)    Monocytes Absolute 1.7 (*)    All other components within normal limits  RETICULOCYTES - Abnormal; Notable for the following:    Retic Ct Pct 10.6 (*)    RBC. 2.32 (*)    Retic Count, Manual 245.9 (*)    All other components within normal limits  COMPREHENSIVE METABOLIC PANEL - Abnormal; Notable for the following:    Total Bilirubin 4.1 (*)    All other components within normal limits  I-STAT TROPOININ, ED    EKG  EKG Interpretation  Date/Time:  Friday March 11 2016 08:15:24 EST Ventricular Rate:  73 PR Interval:    QRS Duration: 116 QT Interval:  434 QTC Calculation: 479 R Axis:   -99 Text Interpretation:  Sinus rhythm Left atrial enlargement Nonspecific IVCD with LAD Inferoposterior infarct, recent Abnormal lateral Q waves Abnrm T, consider ischemia, anterolateral lds Confirmed by Lita Mains  MD, Magdeline Prange ([id number]) on 03/11/2016 8:47:53 AM       Radiology Dg Chest 2 View  Result Date: 03/11/2016 CLINICAL DATA:  Sickle cell pain crisis with bilateral chest pain beginning this morning. EXAM: CHEST  2 VIEW COMPARISON:  03/06/2016 FINDINGS: Moderate cardiomegaly remains stable. Left-sided power port remains in appropriate position. Mild scarring and peripheral right midlung is unchanged. No evidence of acute pulmonary infiltrate or edema. No evidence of pleural effusion. IMPRESSION: Stable cardiomegaly.  No active lung disease. Electronically Signed   By: Earle Gell M.D.   On: 03/11/2016 08:55    Procedures Procedures (including critical care time)  Medications Ordered in ED Medications  HYDROmorphone (DILAUDID) injection 2 mg (2 mg Intravenous Given 03/11/16 0848)    Or  HYDROmorphone (DILAUDID) injection 2 mg ( Subcutaneous See Alternative 03/11/16 0848)  HYDROmorphone (DILAUDID) injection 2 mg (2 mg Intravenous  Given 03/11/16 1012)    Or  HYDROmorphone (DILAUDID) injection 2 mg ( Subcutaneous See Alternative 03/11/16 1012)  HYDROmorphone (DILAUDID) injection 2 mg (2 mg Intravenous Given 03/11/16 1100)    Or  HYDROmorphone (DILAUDID) injection 2 mg ( Subcutaneous See Alternative 03/11/16 1100)  ketorolac (TORADOL) 30 MG/ML injection 30 mg (30 mg Intravenous Given 03/11/16 0848)  diphenhydrAMINE (BENADRYL) injection 25 mg (25 mg Intravenous Given 03/11/16 1100)  oxyCODONE-acetaminophen (PERCOCET/ROXICET) 5-325 MG per tablet 2 tablet (2 tablets Oral Given 03/11/16 1259)  heparin lock flush 100 unit/mL (500 Units Intracatheter Given 03/11/16 1415)     Initial Impression / Assessment and Plan / ED Course  I have reviewed the triage vital signs and the nursing notes.  Pertinent labs & imaging results that were available during my care of the patient were reviewed by me and considered in my medical decision making (see chart for details).     Patient's pain is significantly improved. Maintaining saturation on nasal cannula oxygen. Laboratory testing has shown improvement over previous admission labs. Patient has prescriptions for pain medications at [facility] which he will go pick up. Discharge home with friend. Given return precautions and is voiced understanding.  Final Clinical Impressions(s) / ED Diagnoses   Final diagnoses:  Chest wall pain    New Prescriptions Discharge Medication List as of 03/11/2016  1:53 PM       Julianne Rice, MD 03/11/16 1630

## 2016-03-11 NOTE — Care Management (Signed)
ED CM discussed patient with [place] Coordinator at the [facility]. Walk-In clinic is closed after 1pm. EDP will consult with Dr. Doreene Burke concerning patient.  No further ED CM needs identified at this time.

## 2016-03-13 ENCOUNTER — Emergency Department (HOSPITAL_COMMUNITY): Payer: Medicare Other

## 2016-03-13 ENCOUNTER — Encounter (HOSPITAL_COMMUNITY): Payer: Self-pay | Admitting: Emergency Medicine

## 2016-03-13 ENCOUNTER — Inpatient Hospital Stay (HOSPITAL_COMMUNITY)
Admission: EM | Admit: 2016-03-13 | Discharge: 2016-03-19 | DRG: 811 | Disposition: A | Discharge status: Home / Self Care | Payer: Medicare Other | Attending: Internal Medicine | Admitting: Internal Medicine

## 2016-03-13 DIAGNOSIS — D571 Sickle-cell disease without crisis: Secondary | ICD-10-CM | POA: Diagnosis present

## 2016-03-13 DIAGNOSIS — Z7901 Long term (current) use of anticoagulants: Secondary | ICD-10-CM | POA: Diagnosis not present

## 2016-03-13 DIAGNOSIS — D5701 Hb-SS disease with acute chest syndrome: Secondary | ICD-10-CM | POA: Diagnosis not present

## 2016-03-13 DIAGNOSIS — Z833 Family history of diabetes mellitus: Secondary | ICD-10-CM | POA: Diagnosis not present

## 2016-03-13 DIAGNOSIS — R079 Chest pain, unspecified: Secondary | ICD-10-CM

## 2016-03-13 DIAGNOSIS — I272 Pulmonary hypertension, unspecified: Secondary | ICD-10-CM | POA: Diagnosis present

## 2016-03-13 DIAGNOSIS — E875 Hyperkalemia: Secondary | ICD-10-CM | POA: Diagnosis not present

## 2016-03-13 DIAGNOSIS — Z9119 Patient's noncompliance with other medical treatment and regimen: Secondary | ICD-10-CM | POA: Diagnosis not present

## 2016-03-13 DIAGNOSIS — R748 Abnormal levels of other serum enzymes: Secondary | ICD-10-CM | POA: Diagnosis not present

## 2016-03-13 DIAGNOSIS — D638 Anemia in other chronic diseases classified elsewhere: Secondary | ICD-10-CM | POA: Diagnosis not present

## 2016-03-13 DIAGNOSIS — D5700 Hb-SS disease with crisis, unspecified: Secondary | ICD-10-CM | POA: Diagnosis not present

## 2016-03-13 DIAGNOSIS — Z818 Family history of other mental and behavioral disorders: Secondary | ICD-10-CM | POA: Diagnosis not present

## 2016-03-13 DIAGNOSIS — D72829 Elevated white blood cell count, unspecified: Secondary | ICD-10-CM | POA: Diagnosis present

## 2016-03-13 DIAGNOSIS — Z9981 Dependence on supplemental oxygen: Secondary | ICD-10-CM | POA: Diagnosis not present

## 2016-03-13 DIAGNOSIS — R000 Tachycardia, unspecified: Secondary | ICD-10-CM | POA: Diagnosis present

## 2016-03-13 DIAGNOSIS — Z86711 Personal history of pulmonary embolism: Secondary | ICD-10-CM | POA: Diagnosis not present

## 2016-03-13 DIAGNOSIS — I2729 Other secondary pulmonary hypertension: Secondary | ICD-10-CM | POA: Diagnosis present

## 2016-03-13 DIAGNOSIS — Z87891 Personal history of nicotine dependence: Secondary | ICD-10-CM | POA: Diagnosis not present

## 2016-03-13 DIAGNOSIS — Z96641 Presence of right artificial hip joint: Secondary | ICD-10-CM | POA: Diagnosis present

## 2016-03-13 DIAGNOSIS — I4581 Long QT syndrome: Secondary | ICD-10-CM | POA: Diagnosis present

## 2016-03-13 DIAGNOSIS — J9621 Acute and chronic respiratory failure with hypoxia: Secondary | ICD-10-CM | POA: Diagnosis not present

## 2016-03-13 LAB — COMPREHENSIVE METABOLIC PANEL
ALT: 24 U/L (ref 17–63)
AST: 39 U/L (ref 15–41)
Albumin: 4.3 g/dL (ref 3.5–5.0)
Alkaline Phosphatase: 90 U/L (ref 38–126)
Anion gap: 7 (ref 5–15)
BUN: 8 mg/dL (ref 6–20)
CALCIUM: 8.9 mg/dL (ref 8.9–10.3)
CHLORIDE: 109 mmol/L (ref 101–111)
CO2: 22 mmol/L (ref 22–32)
CREATININE: 0.86 mg/dL (ref 0.61–1.24)
Glucose, Bld: 127 mg/dL — ABNORMAL HIGH (ref 65–99)
Potassium: 3.8 mmol/L (ref 3.5–5.1)
SODIUM: 138 mmol/L (ref 135–145)
Total Bilirubin: 5 mg/dL — ABNORMAL HIGH (ref 0.3–1.2)
Total Protein: 7.7 g/dL (ref 6.5–8.1)

## 2016-03-13 LAB — BLOOD GAS, ARTERIAL
Acid-base deficit: 3.7 mmol/L — ABNORMAL HIGH (ref 0.0–2.0)
Bicarbonate: 21 mmol/L (ref 20.0–28.0)
O2 Content: 4 L/min
O2 Saturation: 85.1 %
PCO2 ART: 38.8 mmHg (ref 32.0–48.0)
PH ART: 7.352 (ref 7.350–7.450)
Patient temperature: 98.6
pO2, Arterial: 61.6 mmHg — ABNORMAL LOW (ref 83.0–108.0)

## 2016-03-13 LAB — TROPONIN I
TROPONIN I: 0.31 ng/mL — AB (ref <0.03)
Troponin I: 0.3 ng/mL (ref <0.03)

## 2016-03-13 LAB — CBC WITH DIFFERENTIAL/PLATELET
BASOS PCT: 1 %
Basophils Absolute: 0.2 10*3/uL — ABNORMAL HIGH (ref 0.0–0.1)
EOS ABS: 0.5 10*3/uL (ref 0.0–0.7)
EOS PCT: 3 %
HCT: 21.6 % — ABNORMAL LOW (ref 39.0–52.0)
HEMOGLOBIN: 7.4 g/dL — AB (ref 13.0–17.0)
LYMPHS PCT: 20 %
Lymphs Abs: 3.1 10*3/uL (ref 0.7–4.0)
MCH: 31 pg (ref 26.0–34.0)
MCHC: 34.3 g/dL (ref 30.0–36.0)
MCV: 90.4 fL (ref 78.0–100.0)
MONO ABS: 2.6 10*3/uL — AB (ref 0.1–1.0)
Monocytes Relative: 17 %
NEUTROS PCT: 59 %
Neutro Abs: 9.1 10*3/uL — ABNORMAL HIGH (ref 1.7–7.7)
PLATELETS: 514 10*3/uL — AB (ref 150–400)
RBC: 2.39 MIL/uL — AB (ref 4.22–5.81)
RDW: 21.5 % — ABNORMAL HIGH (ref 11.5–15.5)
WBC: 15.5 10*3/uL — AB (ref 4.0–10.5)

## 2016-03-13 LAB — URINALYSIS, ROUTINE W REFLEX MICROSCOPIC
BACTERIA UA: NONE SEEN
BILIRUBIN URINE: NEGATIVE
GLUCOSE, UA: NEGATIVE mg/dL
Ketones, ur: NEGATIVE mg/dL
Leukocytes, UA: NEGATIVE
NITRITE: NEGATIVE
PH: 7 (ref 5.0–8.0)
Protein, ur: NEGATIVE mg/dL
SPECIFIC GRAVITY, URINE: 1.01 (ref 1.005–1.030)

## 2016-03-13 LAB — I-STAT TROPONIN, ED: TROPONIN I, POC: 0.13 ng/mL — AB (ref 0.00–0.08)

## 2016-03-13 LAB — RETICULOCYTES
RBC.: 2.39 MIL/uL — ABNORMAL LOW (ref 4.22–5.81)
RETIC CT PCT: 18.4 % — AB (ref 0.4–3.1)
Retic Count, Absolute: 439.8 10*3/uL — ABNORMAL HIGH (ref 19.0–186.0)

## 2016-03-13 LAB — I-STAT CG4 LACTIC ACID, ED: Lactic Acid, Venous: 0.84 mmol/L (ref 0.5–1.9)

## 2016-03-13 MED ORDER — ENOXAPARIN SODIUM 80 MG/0.8ML ~~LOC~~ SOLN: 1.0000 mg/kg | Freq: Two times a day (BID) | SUBCUTANEOUS | Status: DC

## 2016-03-13 MED ORDER — HYDROMORPHONE HCL 2 MG/ML IJ SOLN
2.0000 mg | INTRAMUSCULAR | Status: AC
  Administered 2016-03-13: 2 mg via INTRAVENOUS
  Filled 2016-03-13: qty 1

## 2016-03-13 MED ORDER — IOPAMIDOL (ISOVUE-370) INJECTION 76%
100.0000 mL | Freq: Once | INTRAVENOUS | Status: AC | PRN
  Administered 2016-03-13: 100 mL via INTRAVENOUS

## 2016-03-13 MED ORDER — HYDROMORPHONE HCL 2 MG/ML IJ SOLN: 2.0000 mg | INTRAMUSCULAR | Status: DC

## 2016-03-13 MED ORDER — HYDROMORPHONE HCL 2 MG/ML IJ SOLN
2.0000 mg | INTRAMUSCULAR | Status: AC
  Administered 2016-03-13: 2 mg via INTRAVENOUS

## 2016-03-13 MED ORDER — HYDROMORPHONE HCL 2 MG/ML IJ SOLN: 2.0000 mg | INTRAMUSCULAR | Status: AC

## 2016-03-13 MED ORDER — DEXTROSE-NACL 5-0.45 % IV SOLN
INTRAVENOUS | Status: DC
  Administered 2016-03-13 – 2016-03-16 (×4): via INTRAVENOUS

## 2016-03-13 MED ORDER — DEFERASIROX 360 MG PO TABS: 1080.0000 mg | ORAL_TABLET | Freq: Every day | ORAL | Status: DC

## 2016-03-13 MED ORDER — ZOLPIDEM TARTRATE 10 MG PO TABS
10.0000 mg | ORAL_TABLET | Freq: Every evening | ORAL | Status: DC | PRN
  Administered 2016-03-13 – 2016-03-18 (×6): 10 mg via ORAL
  Filled 2016-03-13 (×6): qty 1

## 2016-03-13 MED ORDER — FOLIC ACID 1 MG PO TABS
1.0000 mg | ORAL_TABLET | Freq: Every day | ORAL | Status: DC
  Administered 2016-03-14 – 2016-03-19 (×6): 1 mg via ORAL
  Filled 2016-03-13 (×6): qty 1

## 2016-03-13 MED ORDER — SODIUM CHLORIDE 0.9 % IV SOLN
25.0000 mg | INTRAVENOUS | Status: DC | PRN
  Filled 2016-03-13: qty 0.5

## 2016-03-13 MED ORDER — GABAPENTIN 300 MG PO CAPS
300.0000 mg | ORAL_CAPSULE | Freq: Three times a day (TID) | ORAL | Status: DC
  Administered 2016-03-13 – 2016-03-19 (×18): 300 mg via ORAL
  Filled 2016-03-13 (×19): qty 1

## 2016-03-13 MED ORDER — DIPHENHYDRAMINE HCL 25 MG PO CAPS
25.0000 mg | ORAL_CAPSULE | ORAL | Status: DC | PRN
  Administered 2016-03-13: 25 mg via ORAL
  Filled 2016-03-13: qty 1

## 2016-03-13 MED ORDER — POTASSIUM CHLORIDE CRYS ER 20 MEQ PO TBCR
40.0000 meq | EXTENDED_RELEASE_TABLET | Freq: Every day | ORAL | Status: DC
  Administered 2016-03-13 – 2016-03-17 (×5): 40 meq via ORAL
  Filled 2016-03-13 (×5): qty 2

## 2016-03-13 MED ORDER — POLYETHYLENE GLYCOL 3350 17 G PO PACK: 17.0000 g | PACK | Freq: Every day | ORAL | Status: DC | PRN

## 2016-03-13 MED ORDER — HYDROMORPHONE 1 MG/ML IV SOLN
INTRAVENOUS | Status: DC
  Administered 2016-03-13: 2.5 mg via INTRAVENOUS
  Administered 2016-03-13: 14:00:00 via INTRAVENOUS
  Administered 2016-03-13: 2 mg via INTRAVENOUS
  Administered 2016-03-13: 7 mg via INTRAVENOUS
  Administered 2016-03-13: 2 mg via INTRAVENOUS
  Administered 2016-03-14: 6.5 mg via INTRAVENOUS
  Administered 2016-03-14: 11 mg via INTRAVENOUS
  Administered 2016-03-14: 2 mg via INTRAVENOUS
  Administered 2016-03-14: 6.5 mg via INTRAVENOUS
  Administered 2016-03-14: 0 mg via INTRAVENOUS
  Administered 2016-03-14: 09:00:00 via INTRAVENOUS
  Administered 2016-03-14 (×2): 2 mg via INTRAVENOUS
  Administered 2016-03-14: 3.5 mg via INTRAVENOUS
  Administered 2016-03-14: 21:00:00 via INTRAVENOUS
  Administered 2016-03-14: 2 mg via INTRAVENOUS
  Administered 2016-03-14: 3.5 mg via INTRAVENOUS
  Administered 2016-03-14: 6 mg via INTRAVENOUS
  Administered 2016-03-14: 2 mg via INTRAVENOUS
  Administered 2016-03-15: 1 mg via INTRAVENOUS
  Administered 2016-03-15: 14:00:00 via INTRAVENOUS
  Administered 2016-03-15: 1 mg via INTRAVENOUS
  Administered 2016-03-15: 2 mg via INTRAVENOUS
  Administered 2016-03-15: 8.5 mg via INTRAVENOUS
  Administered 2016-03-15: 2 mg via INTRAVENOUS
  Administered 2016-03-15: 14 mg via INTRAVENOUS
  Administered 2016-03-15: 2 mg via INTRAVENOUS
  Filled 2016-03-13 (×5): qty 25

## 2016-03-13 MED ORDER — FUROSEMIDE 10 MG/ML IJ SOLN
40.0000 mg | Freq: Once | INTRAMUSCULAR | Status: AC
  Administered 2016-03-13: 40 mg via INTRAVENOUS
  Filled 2016-03-13: qty 4

## 2016-03-13 MED ORDER — LEVOFLOXACIN IN D5W 750 MG/150ML IV SOLN: 750.0000 mg | INTRAVENOUS | Status: DC

## 2016-03-13 MED ORDER — KETOROLAC TROMETHAMINE 30 MG/ML IJ SOLN
30.0000 mg | INTRAMUSCULAR | Status: AC
  Administered 2016-03-13: 30 mg via INTRAVENOUS
  Filled 2016-03-13: qty 1

## 2016-03-13 MED ORDER — SENNOSIDES-DOCUSATE SODIUM 8.6-50 MG PO TABS
1.0000 | ORAL_TABLET | Freq: Two times a day (BID) | ORAL | Status: DC
  Administered 2016-03-13 – 2016-03-19 (×11): 1 via ORAL
  Filled 2016-03-13 (×12): qty 1

## 2016-03-13 MED ORDER — SODIUM CHLORIDE 0.9% FLUSH: 9.0000 mL | INTRAVENOUS | Status: DC | PRN

## 2016-03-13 MED ORDER — VITAMIN D3 25 MCG (1000 UNIT) PO TABS
2000.0000 [IU] | ORAL_TABLET | Freq: Every day | ORAL | Status: DC
  Administered 2016-03-14 – 2016-03-19 (×6): 2000 [IU] via ORAL
  Filled 2016-03-13 (×6): qty 2

## 2016-03-13 MED ORDER — KETOROLAC TROMETHAMINE 30 MG/ML IJ SOLN
30.0000 mg | Freq: Four times a day (QID) | INTRAMUSCULAR | Status: DC
  Administered 2016-03-13 – 2016-03-17 (×14): 30 mg via INTRAVENOUS
  Filled 2016-03-13 (×15): qty 1

## 2016-03-13 MED ORDER — RIVAROXABAN 20 MG PO TABS
20.0000 mg | ORAL_TABLET | Freq: Every day | ORAL | Status: DC
  Administered 2016-03-13 – 2016-03-19 (×7): 20 mg via ORAL
  Filled 2016-03-13 (×7): qty 1

## 2016-03-13 MED ORDER — DEXTROSE 5 % IV SOLN
1.0000 g | INTRAVENOUS | Status: DC
  Administered 2016-03-14: 1 g via INTRAVENOUS
  Filled 2016-03-13: qty 10

## 2016-03-13 MED ORDER — HYDROMORPHONE HCL 2 MG/ML IJ SOLN
2.0000 mg | INTRAMUSCULAR | Status: AC
  Filled 2016-03-13: qty 1

## 2016-03-13 MED ORDER — DEXTROSE 5 % IV SOLN
500.0000 mg | INTRAVENOUS | Status: DC
  Administered 2016-03-14: 500 mg via INTRAVENOUS
  Filled 2016-03-13: qty 500

## 2016-03-13 MED ORDER — DEXTROSE 5 % IV SOLN
500.0000 mg | Freq: Once | INTRAVENOUS | Status: AC
  Administered 2016-03-13: 500 mg via INTRAVENOUS
  Filled 2016-03-13: qty 500

## 2016-03-13 MED ORDER — MORPHINE SULFATE ER 30 MG PO TBCR
30.0000 mg | EXTENDED_RELEASE_TABLET | Freq: Two times a day (BID) | ORAL | Status: DC
  Administered 2016-03-13 – 2016-03-19 (×12): 30 mg via ORAL
  Filled 2016-03-13 (×12): qty 1

## 2016-03-13 MED ORDER — HYDROMORPHONE HCL 2 MG/ML IJ SOLN
2.0000 mg | INTRAMUSCULAR | Status: DC
  Administered 2016-03-13 (×2): 2 mg via INTRAVENOUS
  Filled 2016-03-13 (×2): qty 1

## 2016-03-13 MED ORDER — ONDANSETRON HCL 4 MG/2ML IJ SOLN: 4.0000 mg | Freq: Four times a day (QID) | INTRAMUSCULAR | Status: DC | PRN

## 2016-03-13 MED ORDER — SODIUM CHLORIDE 0.9% FLUSH
10.0000 mL | INTRAVENOUS | Status: DC | PRN
  Administered 2016-03-14 – 2016-03-19 (×6): 10 mL
  Filled 2016-03-13 (×6): qty 40

## 2016-03-13 MED ORDER — ASPIRIN 81 MG PO CHEW
81.0000 mg | CHEWABLE_TABLET | Freq: Every day | ORAL | Status: DC
  Administered 2016-03-13 – 2016-03-19 (×7): 81 mg via ORAL
  Filled 2016-03-13 (×7): qty 1

## 2016-03-13 MED ORDER — NALOXONE HCL 0.4 MG/ML IJ SOLN: 0.4000 mg | INTRAMUSCULAR | Status: DC | PRN

## 2016-03-13 MED ORDER — AMBRISENTAN 5 MG PO TABS
5.0000 mg | ORAL_TABLET | Freq: Every day | ORAL | Status: DC
  Administered 2016-03-14 – 2016-03-19 (×6): 5 mg via ORAL
  Filled 2016-03-13 (×2): qty 1

## 2016-03-13 MED ORDER — IOPAMIDOL (ISOVUE-370) INJECTION 76%
INTRAVENOUS | Status: AC
  Filled 2016-03-13: qty 100

## 2016-03-13 MED ORDER — DEXTROSE 5 % IV SOLN
1.0000 g | Freq: Once | INTRAVENOUS | Status: AC
  Administered 2016-03-13: 1 g via INTRAVENOUS
  Filled 2016-03-13: qty 10

## 2016-03-13 MED ORDER — DIPHENHYDRAMINE HCL 25 MG PO CAPS: 25.0000 mg | ORAL_CAPSULE | ORAL | Status: DC | PRN

## 2016-03-13 NOTE — ED Provider Notes (Addendum)
Emergency Department Provider Note   I have reviewed the triage vital signs and the nursing notes.   HISTORY  Chief Complaint Sickle Cell Pain Crisis and Chest Pain   HPI [name] is a 36 y.o. male with PMH of SSD, acute chest and PE presents to the emergency department for evaluation of sudden onset left-sided chest pain at 4 AM today. Patient states this woke him from sleep. He describes it as sharp and radiating down the left side of his chest and slightly into his abdomen. He has some associated mild difficulty breathing. No fevers, chills, recent flu-like illness. Denies any associated nausea or vomiting. Describes this pain as moderate to severe. He tried home medications without relief and states that this feels somewhat atypical for his sickle cell crisis pain.   Past Medical History:  Diagnosis Date  . Acute chest syndrome ([place]) 06/18/2013  . Acute embolism and thrombosis of right internal jugular vein ([place])   . Alcohol consumption of one to four drinks per day   . Avascular necrosis (HCC)    Right Hip  . Blood transfusion   . Chronic anticoagulation   . Demand ischemia ([place]) 01/02/2014  . Former smoker   . Functional asplenia   . Hb-SS disease with crisis ([place])   . History of Clostridium difficile infection   . History of pulmonary embolus (PE)   . Hypertension   . Hypokalemia   . Leukocytosis    Chronic  . Mood disorder ([place])   . Noncompliance with medication regimen   . Oxygen deficiency   . Pulmonary hypertension   . Second hand tobacco smoke exposure   . Sickle cell anemia (HCC)   . Sickle-cell crisis with associated acute chest syndrome ([place]) 05/13/2013  . Stroke ([place])   . Thrombocytosis (HCC)    Chronic  . Uses marijuana     Patient Active Problem List   Diagnosis Date Noted  . Chest pain 03/13/2016  . Hypoxia 03/06/2016  . Slurred speech 11/10/2015  . Ischemic stroke ([place])   . Anemia 01/31/2015  . Chronic anemia   . Chest pain  syndrome   . Hb-SS disease without crisis ([place]) 10/07/2014  . Acute on chronic respiratory failure with hypoxia ([place])   . Abnormal EKG 09/10/2014  . Troponin level elevated 08/29/2014  . Cor pulmonale, chronic ([place]) 08/25/2014  . Sickle cell anemia ([place]) 06/25/2014  . Anemia of chronic disease 06/25/2014  . PAH (pulmonary artery hypertension) 03/18/2014  . Chronic respiratory failure with hypoxia ([place]) 03/14/2014  . Paralytic strabismus, external ophthalmoplegia   . Chronic pain syndrome 12/12/2013  . Chronic anticoagulation 08/22/2013  . Essential hypertension 08/22/2013  . Pulmonary HTN 06/18/2013  . Functional asplenia   . Vitamin D deficiency 02/13/2013  . Hx of pulmonary embolus 06/29/2012  . Hemochromatosis 12/14/2011  . Avascular necrosis [facility])     Past Surgical History:  Procedure Laterality Date  . CHOLECYSTECTOMY     01/2008  . Excision of left periauricular cyst     10/2009  . Excision of right ear lobe cyst with primary closur     11/2007  . Porta cath placement    . Porta cath removal    . PORTACATH PLACEMENT  01/05/2012   Procedure: INSERTION PORT-A-CATH;  Surgeon: Odis Hollingshead, MD;  Location: [place];  Service: General;  Laterality: N/A;  ultrasound guiced port a cath insertion with fluoroscopy  . Right hip replacement     08/2006  . UMBILICAL  HERNIA REPAIR     01/2008      Allergies Patient has no known allergies.  Family History  Problem Relation Age of Onset  . Sickle cell trait Mother   . Depression Mother   . Diabetes Mother   . Sickle cell trait Father   . Sickle cell trait Brother     Social History Social History  Substance Use Topics  . Smoking status: Former Smoker    Packs/day: 0.50    Years: 10.00    Types: Cigarettes    Quit date: 05/29/2011  . Smokeless tobacco: Never Used  . Alcohol use No    Review of Systems  Constitutional: No fever/chills Eyes: No visual changes. ENT: No sore throat. Cardiovascular: Positive chest  pain. Respiratory: Denies shortness of breath. Gastrointestinal: No abdominal pain.  No nausea, no vomiting.  No diarrhea.  No constipation. Genitourinary: Negative for dysuria. Musculoskeletal: Negative for back pain. Skin: Negative for rash. Neurological: Negative for headaches, focal weakness or numbness.  10-point ROS otherwise negative.  ____________________________________________   PHYSICAL EXAM:  VITAL SIGNS: ED Triage Vitals  Enc Vitals Group     BP 03/13/16 0801 125/84     Pulse Rate 03/13/16 0801 96     Resp 03/13/16 0801 18     Temp 03/13/16 0801 98.8 F (37.1 C)     Temp Source 03/13/16 0801 Oral     SpO2 03/13/16 0801 (!) 88 %     Weight 03/13/16 0800 178 lb (80.7 kg)     Height 03/13/16 0800 6' (1.829 m)     Pain Score 03/13/16 0800 8   Constitutional: Alert and oriented. Well appearing and in no acute distress. Eyes: Conjunctivae are normal.  Head: Atraumatic. Nose: No congestion/rhinnorhea. Mouth/Throat: Mucous membranes are moist.  Oropharynx non-erythematous. Neck: No stridor. Cardiovascular: Normal rate, regular rhythm. Good peripheral circulation. Grossly normal heart sounds.   Respiratory: Normal respiratory effort.  No retractions. Lungs CTAB. Gastrointestinal: Soft and nontender. No distention.  Musculoskeletal: No lower extremity tenderness nor edema. No gross deformities of extremities. Neurologic:  Normal speech and language. No gross focal neurologic deficits are appreciated.  Skin:  Skin is warm, dry and intact. No rash noted. Psychiatric: Mood and affect are normal. Speech and behavior are normal.  ____________________________________________   LABS (all labs ordered are listed, but only abnormal results are displayed)  Labs Reviewed  COMPREHENSIVE METABOLIC PANEL - Abnormal; Notable for the following:       Result Value   Glucose, Bld 127 (*)    Total Bilirubin 5.0 (*)    All other components within normal limits  CBC WITH  DIFFERENTIAL/PLATELET - Abnormal; Notable for the following:    WBC 15.5 (*)    RBC 2.39 (*)    Hemoglobin 7.4 (*)    HCT 21.6 (*)    RDW 21.5 (*)    Platelets 514 (*)    Neutro Abs 9.1 (*)    Monocytes Absolute 2.6 (*)    Basophils Absolute 0.2 (*)    All other components within normal limits  RETICULOCYTES - Abnormal; Notable for the following:    Retic Ct Pct 18.4 (*)    RBC. 2.39 (*)    Retic Count, Manual 439.8 (*)    All other components within normal limits  URINALYSIS, ROUTINE W REFLEX MICROSCOPIC - Abnormal; Notable for the following:    Hgb urine dipstick SMALL (*)    Squamous Epithelial / LPF 0-5 (*)    All other components within  normal limits  BLOOD GAS, ARTERIAL - Abnormal; Notable for the following:    pO2, Arterial 61.6 (*)    Acid-base deficit 3.7 (*)    All other components within normal limits  TROPONIN I - Abnormal; Notable for the following:    Troponin I 0.30 (*)    All other components within normal limits  I-STAT TROPOININ, ED - Abnormal; Notable for the following:    Troponin i, poc 0.13 (*)    All other components within normal limits  CULTURE, BLOOD (ROUTINE X 2)  CULTURE, BLOOD (ROUTINE X 2)  TROPONIN I  TROPONIN I  I-STAT CG4 LACTIC ACID, ED  I-STAT CG4 LACTIC ACID, ED   ____________________________________________  EKG   EKG Interpretation  Date/Time:  Sunday March 13 2016 10:28:38 EST Ventricular Rate:  100 PR Interval:    QRS Duration: 117 QT Interval:  382 QTC Calculation: 493 R Axis:   -96 Text Interpretation:  Sinus tachycardia Left atrial enlargement Nonspecific IVCD with LAD Inferior infarct, old Abnormal lateral Q waves Probable anterior infarct, age indeterminate No STEMI. Similar to prior.  Confirmed by Raden Byington MD, Suann Klier [id number]) on 03/13/2016 10:40:47 AM       ____________________________________________  RADIOLOGY  Ct Angio Chest Pe W And/or Wo Contrast  Result Date: 03/13/2016 CLINICAL DATA:  History of sickle  cell. Left-sided chest pain starting this morning about 3 a.m. EXAM: CT ANGIOGRAPHY CHEST WITH CONTRAST TECHNIQUE: Multidetector CT imaging of the chest was performed using the standard protocol during bolus administration of intravenous contrast. Multiplanar CT image reconstructions and MIPs were obtained to evaluate the vascular anatomy. CONTRAST:  100 mL Isovue 370 COMPARISON:  CT chest 03/06/2016, 02/21/2016, 01/05/2016 FINDINGS: Cardiovascular: Satisfactory opacification of the pulmonary arteries to the segmental level. No evidence of pulmonary embolism. Stable cardiomegaly. No pericardial effusion. Normal caliber thoracic aorta. No thoracic aortic atherosclerosis or dissection. Left-sided Port-A-Cath again noted. Mediastinum/Nodes: Thyroid gland, trachea, and esophagus demonstrate no significant findings. Multiple small prevascular lymph nodes unchanged compared with the prior exams. Mildly enlarged stable subcarinal lymph node. No axillary lymphadenopathy. Lungs/Pleura: Patchy areas of ground-glass opacities in bilateral upper lobes and lower lobes which may reflect mild pulmonary edema versus alveolitis secondary to an infectious or inflammatory etiology. No pleural effusion or pneumothorax. Upper Abdomen: No acute abnormality in the upper abdomen. Chronic calcified, involuted spleen. Musculoskeletal: No acute osseous abnormality. No lytic or sclerotic osseous lesion. Soft tissue: Bilateral retroareolar fibroglandular soft tissue as can be seen with gynecomastia. Review of the MIP images confirms the above findings. IMPRESSION: 1. No evidence of acute pulmonary embolus. 2. Stable cardiomegaly. 3. Patchy areas of ground-glass opacities in bilateral upper lobes and lower lobes which may reflect mild pulmonary edema versus alveolitis secondary to an infectious or inflammatory etiology. Electronically Signed   By: Kathreen Devoid   On: 03/13/2016 10:13     ____________________________________________   PROCEDURES  Procedure(s) performed:   Procedures  CRITICAL CARE Performed by: Margette Fast Total critical care time: 30 minutes Critical care time was exclusive of separately billable procedures and treating other patients. Critical care was necessary to treat or prevent imminent or life-threatening deterioration. Critical care was time spent personally by me on the following activities: development of treatment plan with patient and/or surrogate as well as nursing, discussions with consultants, evaluation of patient's response to treatment, examination of patient, obtaining history from patient or surrogate, ordering and performing treatments and interventions, ordering and review of laboratory studies, ordering and review of radiographic studies, pulse  oximetry and re-evaluation of patient's condition.  Nanda Quinton, MD Emergency Medicine  ____________________________________________   INITIAL IMPRESSION / ASSESSMENT AND PLAN / ED COURSE  Pertinent labs & imaging results that were available during my care of the patient were reviewed by me and considered in my medical decision making (see chart for details).  Patient with past medical history of sickle cell crisis, acute chest and prior pulmonary embolism presents with sudden onset left sided chest pain with some mild shortness of breath. He has mild hypoxemia on arrival. No fevers or chills. Patient is overall well-appearing. Plan for pain medication and chest x-ray. Would consider CT angiogram to evaluate for pulmonary embolism and chest x-ray is equivocal or negative.  08:40 AM Troponin elevation to 0.13 noted. No acute ischemia on EKG. Suspect elevation from secondary cause rather than primary cardiac insult. CXR pending. Strongly consider CTA to evaluate for PE.   10:22 AM CT scan negative for pulmonary embolism. It does show some bilateral cramping class opacity concerning  for mild edema versus developing infection. With hypoxemia chest discomfort I plan to cover the patient with antibiotics and continue oxygen therapy. Will discuss case with cardiology and repeat EKG with elevated troponin but suspect is secondary to a noncardiac cause.   Discussed patient's case with hospitalist, Dr. Quincy Simmonds. Patient and family (if present) updated with plan. Care transferred to hospitalist service.  I reviewed all nursing notes, vitals, pertinent old records, EKGs, labs, imaging (as available).  ____________________________________________  FINAL CLINICAL IMPRESSION(S) / ED DIAGNOSES  Final diagnoses:  Chest pain, unspecified type  Sickle cell crisis ([place])     MEDICATIONS GIVEN DURING THIS VISIT:  Medications  dextrose 5 %-0.45 % sodium chloride infusion ( Intravenous Transfusing/Transfer 03/13/16 1327)  HYDROmorphone (DILAUDID) injection 2 mg (not administered)    Or  HYDROmorphone (DILAUDID) injection 2 mg (not administered)  diphenhydrAMINE (BENADRYL) capsule 25-50 mg (25 mg Oral Given 03/13/16 0839)  iopamidol (ISOVUE-370) 76 % injection (not administered)  azithromycin (ZITHROMAX) 500 mg in dextrose 5 % 250 mL IVPB (not administered)  cefTRIAXone (ROCEPHIN) 1 g in dextrose 5 % 50 mL IVPB (not administered)  aspirin chewable tablet 81 mg (not administered)  gabapentin (NEURONTIN) capsule 300 mg (300 mg Oral Given 03/13/16 1610)  Deferasirox TABS 1,080 mg (not administered)  ambrisentan (LETAIRIS) tablet 5 mg (not administered)  cholecalciferol (VITAMIN D) tablet 2,000 Units (not administered)  folic acid (FOLVITE) tablet 1 mg (not administered)  potassium chloride SA (K-DUR,KLOR-CON) CR tablet 40 mEq (not administered)  rivaroxaban (XARELTO) tablet 20 mg (20 mg Oral Given 03/13/16 1610)  zolpidem (AMBIEN) tablet 10 mg (not administered)  morphine (MS CONTIN) 12 hr tablet 30 mg (not administered)  senna-docusate (Senokot-S) tablet 1 tablet (not administered)   polyethylene glycol (MIRALAX / GLYCOLAX) packet 17 g (not administered)  naloxone (NARCAN) injection 0.4 mg (not administered)    And  sodium chloride flush (NS) 0.9 % injection 9 mL (not administered)  ondansetron (ZOFRAN) injection 4 mg (not administered)  diphenhydrAMINE (BENADRYL) capsule 25-50 mg (not administered)    Or  diphenhydrAMINE (BENADRYL) 25 mg in sodium chloride 0.9 % 50 mL IVPB (not administered)  ketorolac (TORADOL) 30 MG/ML injection 30 mg (not administered)  HYDROmorphone (DILAUDID) 1 mg/mL PCA injection (2.5 mg Intravenous Received 03/13/16 1600)  sodium chloride flush (NS) 0.9 % injection 10-40 mL (not administered)  ketorolac (TORADOL) 30 MG/ML injection 30 mg (30 mg Intravenous Given 03/13/16 0840)  HYDROmorphone (DILAUDID) injection 2 mg (2 mg Intravenous Given 03/13/16  0937)    Or  HYDROmorphone (DILAUDID) injection 2 mg ( Subcutaneous See Alternative 03/13/16 0937)  HYDROmorphone (DILAUDID) injection 2 mg (2 mg Intravenous Given 03/13/16 0839)    Or  HYDROmorphone (DILAUDID) injection 2 mg ( Subcutaneous See Alternative 03/13/16 0839)  iopamidol (ISOVUE-370) 76 % injection 100 mL (100 mLs Intravenous Contrast Given 03/13/16 0956)  cefTRIAXone (ROCEPHIN) 1 g in dextrose 5 % 50 mL IVPB (0 g Intravenous Stopped 03/13/16 1213)  azithromycin (ZITHROMAX) 500 mg in dextrose 5 % 250 mL IVPB (0 mg Intravenous Stopped 03/13/16 1327)  furosemide (LASIX) injection 40 mg (40 mg Intravenous Given 03/13/16 1441)     NEW OUTPATIENT MEDICATIONS STARTED DURING THIS VISIT:  None   Note:  This document was prepared using Dragon voice recognition software and may include unintentional dictation errors.  Nanda Quinton, MD Emergency Medicine    Margette Fast, MD 03/13/16 [place], MD 03/13/16 1700

## 2016-03-13 NOTE — Progress Notes (Signed)
Pt oxygen saturation 83-87% on 5L [place]. Dr Jonelle Sidle notified. New order for lasix given. Will continue to monitor.

## 2016-03-13 NOTE — ED Notes (Signed)
RN at bedside collecting labs from PT port

## 2016-03-13 NOTE — ED Notes (Signed)
Abnormal lab result MD Long have been made aware

## 2016-03-13 NOTE — Progress Notes (Signed)
This nurse was notified by CCMD that patient had 12beats of wide QRS. Vital signs stable. Patient currently resting, not in distress. [name] NP notified. Will continue to monitor.

## 2016-03-13 NOTE — Progress Notes (Deleted)
ANTICOAGULATION CONSULT NOTE - Initial Consult  Pharmacy Consult for Lovenox - note was on Xarelto prior to admission Indication: pulmonary embolus  No Known Allergies  Patient Measurements: Height: 6' (182.9 cm) Weight: 178 lb (80.7 kg) IBW/kg (Calculated) : 77.6  Vital Signs: Temp: 98.8 F (37.1 C) (02/11 0801) Temp Source: Oral (02/11 0801) BP: 120/81 (02/11 0849) Pulse Rate: 97 (02/11 0849)  Labs:  Recent Labs  03/11/16 0830 03/13/16 0820  HGB 7.3* 7.4*  HCT 21.0* 21.6*  PLT 528* 514*  CREATININE 0.88  --     Estimated Creatinine Clearance: 127.4 mL/min (by C-G formula based on SCr of 0.88 mg/dL).   Medical History: Past Medical History:  Diagnosis Date  . Acute chest syndrome ([place]) 06/18/2013  . Acute embolism and thrombosis of right internal jugular vein ([place])   . Alcohol consumption of one to four drinks per day   . Avascular necrosis (HCC)    Right Hip  . Blood transfusion   . Chronic anticoagulation   . Demand ischemia ([place]) 01/02/2014  . Former smoker   . Functional asplenia   . Hb-SS disease with crisis ([place])   . History of Clostridium difficile infection   . History of pulmonary embolus (PE)   . Hypertension   . Hypokalemia   . Leukocytosis    Chronic  . Mood disorder ([place])   . Noncompliance with medication regimen   . Oxygen deficiency   . Pulmonary hypertension   . Second hand tobacco smoke exposure   . Sickle cell anemia (HCC)   . Sickle-cell crisis with associated acute chest syndrome ([place]) 05/13/2013  . Stroke ([place])   . Thrombocytosis (HCC)    Chronic  . Uses marijuana     Medications:  Scheduled:  .  HYDROmorphone (DILAUDID) injection  2 mg Intravenous Q30 min   Or  .  HYDROmorphone (DILAUDID) injection  2 mg Subcutaneous Q30 min  .  HYDROmorphone (DILAUDID) injection  2 mg Intravenous Q30 min   Or  .  HYDROmorphone (DILAUDID) injection  2 mg Subcutaneous Q30 min  .  HYDROmorphone (DILAUDID) injection  2 mg Intravenous Q1H   Or  .  HYDROmorphone (DILAUDID) injection  2 mg Subcutaneous Q1H   Infusions:  . dextrose 5 % and 0.45% NaCl 125 mL/hr at 03/13/16 [id number]    Assessment: 36 yo male with sickle cell disease presents to ER with pain crisis. To switch Xarelto 20mg  once daily that patient was taking prior to admission to Lovenox for PE. Note last dose of Xarelto was 2/10 at 1900 per med rec. CBC OK. No reported bleeding  Goal of Therapy:  Heparin level 0.6-1.1 units/ml Monitor platelets by anticoagulation protocol: Yes   Plan:  1) Last dose of Xarelto 2/10 at 1900 so cannot start Lovenox till tonight at 1900 2) Start Lovenox 80mg  SQ q12 at 8pm tonight 3) Will continue to monitor [place], PharmD, BCPS Pager [phone] 03/13/2016 9:04 AM

## 2016-03-13 NOTE — H&P (Signed)
[name] is an 36 y.o. male.   Chief Complaint: Chest pain HPI: Patient is a 36 year old gentleman with history of sickle cell disease who presented to the ER with left-sided chest pain today. Chest pain was rated as 7 out of 10 was crushing associated with shortness of breath. It was sudden in onset. Not relieved by his regular medications. Associated with the chest pain is his low back pain and joint pains which are typical of his sickle cell disease. The chest pain however is not typical of his sickle cell disease. He has known history of pulmonary embolism and was hypoxic on arrival. He has been taking his anticoagulation. CT angiogram of the chest was negative for PE. Patient's troponin was minimally elevated and cardiology was consulted. He is being admitted to rule out MI as well as treat acute sickle cell crisis. Patient's chest x-ray is suggestive of possible acute chest syndrome versus pulmonary edema. He has significant history of pulmonary hypertension as well which may be contributing to his symptoms. So for oxygen saturation is in the 80s on 5 L but patient is comfortable.  Past Medical History:  Diagnosis Date  . Acute chest syndrome ([place]) 06/18/2013  . Acute embolism and thrombosis of right internal jugular vein ([place])   . Alcohol consumption of one to four drinks per day   . Avascular necrosis ([facility])    Right Hip  . Blood transfusion   . Chronic anticoagulation   . Demand ischemia ([place]) 01/02/2014  . Former smoker   . Functional asplenia   . Hb-SS disease with crisis ([place])   . History of Clostridium difficile infection   . History of pulmonary embolus (PE)   . Hypertension   . Hypokalemia   . Leukocytosis    Chronic  . Mood disorder ([place])   . Noncompliance with medication regimen   . Oxygen deficiency   . Pulmonary hypertension   . Second hand tobacco smoke exposure   . Sickle cell anemia (H[facility])   . Sickle-cell crisis with associated acute chest syndrome ([place])  05/13/2013  . Stroke ([place])   . Thrombocytosis ([facility])    Chronic  . Uses marijuana     Past Surgical History:  Procedure Laterality Date  . CHOLECYSTECTOMY     01/2008  . Excision of left periauricular cyst     10/2009  . Excision of right ear lobe cyst with primary closur     11/2007  . Porta cath placement    . Porta cath removal    . PORTACATH PLACEMENT  01/05/2012   Procedure: INSERTION PORT-A-CATH;  Surgeon: Odis Hollingshead, MD;  Location: [place] [facility];  Service: General;  Laterality: N/A;  ultrasound guiced port a cath insertion with fluoroscopy  . Right hip replacement     08/2006  . UMBILICAL HERNIA REPAIR     01/2008    Family History  Problem Relation Age of Onset  . Sickle cell trait Mother   . Depression Mother   . Diabetes Mother   . Sickle cell trait Father   . Sickle cell trait Brother    Social History:  reports that he quit smoking about 4 years ago. His smoking use included Cigarettes. He has a 5.00 pack-year smoking history. He has never used smokeless tobacco. He reports that he does not drink alcohol or use drugs.  Allergies: No Known Allergies   (Not in a hospital admission)  Results for orders placed or performed during the hospital encounter of 03/13/16 (  from the past 48 hour(s))  Comprehensive metabolic panel     Status: Abnormal   Collection Time: 03/13/16  8:20 AM  Result Value Ref Range   Sodium 138 135 - 145 mmol/L   Potassium 3.8 3.5 - 5.1 mmol/L   Chloride 109 101 - 111 mmol/L   CO2 22 22 - 32 mmol/L   Glucose, Bld 127 (H) 65 - 99 mg/dL   BUN 8 6 - 20 mg/dL   Creatinine, Ser 0.86 0.61 - 1.24 mg/dL   Calcium 8.9 8.9 - 10.3 mg/dL   Total Protein 7.7 6.5 - 8.1 g/dL   Albumin 4.3 3.5 - 5.0 g/dL   AST 39 15 - 41 U/L   ALT 24 17 - 63 U/L   Alkaline Phosphatase 90 38 - 126 U/L   Total Bilirubin 5.0 (H) 0.3 - 1.2 mg/dL   GFR calc non Af Amer >60 >60 mL/min   GFR calc Af Amer >60 >60 mL/min    Comment: (NOTE) The eGFR has been calculated using  the CKD EPI equation. This calculation has not been validated in all clinical situations. eGFR's persistently <60 mL/min signify possible Chronic Kidney Disease.    Anion gap 7 5 - 15  CBC with Differential     Status: Abnormal   Collection Time: 03/13/16  8:20 AM  Result Value Ref Range   WBC 15.5 (H) 4.0 - 10.5 K/uL   RBC 2.39 (L) 4.22 - 5.81 MIL/uL   Hemoglobin 7.4 (L) 13.0 - 17.0 g/dL   HCT 21.6 (L) 39.0 - 52.0 %   MCV 90.4 78.0 - 100.0 fL   MCH 31.0 26.0 - 34.0 pg   MCHC 34.3 30.0 - 36.0 g/dL   RDW 21.5 (H) 11.5 - 15.5 %   Platelets 514 (H) 150 - 400 K/uL   Neutrophils Relative % 59 %   Lymphocytes Relative 20 %   Monocytes Relative 17 %   Eosinophils Relative 3 %   Basophils Relative 1 %   Neutro Abs 9.1 (H) 1.7 - 7.7 K/uL   Lymphs Abs 3.1 0.7 - 4.0 K/uL   Monocytes Absolute 2.6 (H) 0.1 - 1.0 K/uL   Eosinophils Absolute 0.5 0.0 - 0.7 K/uL   Basophils Absolute 0.2 (H) 0.0 - 0.1 K/uL   RBC Morphology RARE NRBCs     Comment: SICKLE CELLS TARGET CELLS POLYCHROMASIA PRESENT   Reticulocytes     Status: Abnormal   Collection Time: 03/13/16  8:20 AM  Result Value Ref Range   Retic Ct Pct 18.4 (H) 0.4 - 3.1 %    Comment: RESULTS CONFIRMED BY MANUAL DILUTION   RBC. 2.39 (L) 4.22 - 5.81 MIL/uL   Retic Count, Manual 439.8 (H) 19.0 - 186.0 K/uL  I-stat troponin, ED     Status: Abnormal   Collection Time: 03/13/16  8:32 AM  Result Value Ref Range   Troponin i, poc 0.13 (HH) 0.00 - 0.08 ng/mL   Comment NOTIFIED PHYSICIAN    Comment 3            Comment: Due to the release kinetics of cTnI, a negative result within the first hours of the onset of symptoms does not rule out myocardial infarction with certainty. If myocardial infarction is still suspected, repeat the test at appropriate intervals.   I-Stat CG4 Lactic Acid, ED  (not at  [facility])     Status: None   Collection Time: 03/13/16 10:46 AM  Result Value Ref Range   Lactic Acid, Venous 0.84  0.5 - 1.9 mmol/L   *Note:  Due to a large number of results and/or encounters for the requested time period, some results have not been displayed. A complete set of results can be found in Results Review.   Ct Angio Chest Pe W And/or Wo Contrast  Result Date: 03/13/2016 CLINICAL DATA:  History of sickle cell. Left-sided chest pain starting this morning about 3 a.m. EXAM: CT ANGIOGRAPHY CHEST WITH CONTRAST TECHNIQUE: Multidetector CT imaging of the chest was performed using the standard protocol during bolus administration of intravenous contrast. Multiplanar CT image reconstructions and MIPs were obtained to evaluate the vascular anatomy. CONTRAST:  100 mL Isovue 370 COMPARISON:  CT chest 03/06/2016, 02/21/2016, 01/05/2016 FINDINGS: Cardiovascular: Satisfactory opacification of the pulmonary arteries to the segmental level. No evidence of pulmonary embolism. Stable cardiomegaly. No pericardial effusion. Normal caliber thoracic aorta. No thoracic aortic atherosclerosis or dissection. Left-sided Port-A-Cath again noted. Mediastinum/Nodes: Thyroid gland, trachea, and esophagus demonstrate no significant findings. Multiple small prevascular lymph nodes unchanged compared with the prior exams. Mildly enlarged stable subcarinal lymph node. No axillary lymphadenopathy. Lungs/Pleura: Patchy areas of ground-glass opacities in bilateral upper lobes and lower lobes which may reflect mild pulmonary edema versus alveolitis secondary to an infectious or inflammatory etiology. No pleural effusion or pneumothorax. Upper Abdomen: No acute abnormality in the upper abdomen. Chronic calcified, involuted spleen. Musculoskeletal: No acute osseous abnormality. No lytic or sclerotic osseous lesion. Soft tissue: Bilateral retroareolar fibroglandular soft tissue as can be seen with gynecomastia. Review of the MIP images confirms the above findings. IMPRESSION: 1. No evidence of acute pulmonary embolus. 2. Stable cardiomegaly. 3. Patchy areas of ground-glass  opacities in bilateral upper lobes and lower lobes which may reflect mild pulmonary edema versus alveolitis secondary to an infectious or inflammatory etiology. Electronically Signed   By: Kathreen Devoid   On: 03/13/2016 10:13    Review of Systems  Constitutional: Positive for diaphoresis and malaise/fatigue.  HENT: Negative.   Eyes: Negative.   Respiratory: Positive for cough and shortness of breath.   Cardiovascular: Positive for chest pain and palpitations. Negative for leg swelling and PND.  Gastrointestinal: Negative.   Genitourinary: Negative.   Musculoskeletal: Positive for back pain, joint pain and myalgias.  Skin: Negative.   Neurological: Negative.   Endo/Heme/Allergies: Negative.   Psychiatric/Behavioral: Negative.     Blood pressure 137/95, pulse 97, temperature 98.8 F (37.1 C), temperature source Oral, resp. rate 18, height 6' (1.829 m), weight 80.7 kg (178 lb), SpO2 (!) 89 %. Physical Exam  Constitutional: He is oriented to person, place, and time. He appears well-developed and well-nourished.  HENT:  Head: Normocephalic and atraumatic.  Eyes: Conjunctivae are normal. Pupils are equal, round, and reactive to light.  Neck: Normal range of motion. Neck supple.  Cardiovascular: Normal rate and regular rhythm.   Respiratory: Effort normal. No respiratory distress. He has rales.  GI: Soft. Bowel sounds are normal.  Musculoskeletal: Normal range of motion.  Neurological: He is alert and oriented to person, place, and time.  Skin: Skin is warm and dry.  Psychiatric: He has a normal mood and affect.     Assessment/Plan A 36 year old gentleman being admitted with chest pain and elevated troponins. Also sickle cell crisis  1. Chest pain: Non-ST elevation MI is suspected. EKG showed old infarcts no acute changes. Chest pain has been relieved so far. We will admit him and cycle enzymes. Get echocardiogram in the morning. Nitroglycerin as needed as well as cardiology consult in  the morning  depending on results. Patient had cardiac catheterization last year for pulmonary hypertension. It was right-sided but based on cardiology's recommendation he might be a candidate for cardiac stress testing or left heart catheterization. He will be admitted to telemetry bed.  2. Sickle cell painful crisis: Patient will be initiated on Dilaudid PCA with Toradol and IV fluids. I will add physician Assisted dosing as well. Reevaluate pain in the morning.  3. Pulmonary hypertension: Continue oxygen. Patient is not compliant with his oxygen at home. Follow cardiology recommendations  4. Persistent hypoxemia: Acute chest syndrome is in the differentials but patient has chronic hypoxemia. Pulmonary edema more likely. I will challenge him with Lasix to see if he responds. Empiric antibiotics also started for possible pneumonia based on CT scan results. He is afebrile although has leukocytosis which is chronic. I suspect this is not pneumonia and if cultures come back negative we will discontinue antibiotics.  5. Chronic anticoagulation: Continue Xarelto.  6. Anemia of chronic disease: Hemoglobin is at 7.4 which is close to his baseline. Continue close monitoring.  Barbette Merino, MD 03/13/2016, 11:19 AM

## 2016-03-13 NOTE — ED Triage Notes (Signed)
Patient reports left sided chest pain starting this morning at about 3am. Pain radiates to his "whole left side." Hx of sickle cell.

## 2016-03-13 NOTE — Progress Notes (Signed)
RT placed pt on 6 LPM [place] from 4 LPM [place] due low P02.

## 2016-03-14 DIAGNOSIS — R748 Abnormal levels of other serum enzymes: Secondary | ICD-10-CM

## 2016-03-14 DIAGNOSIS — Z7901 Long term (current) use of anticoagulants: Secondary | ICD-10-CM

## 2016-03-14 DIAGNOSIS — J9621 Acute and chronic respiratory failure with hypoxia: Secondary | ICD-10-CM

## 2016-03-14 DIAGNOSIS — D638 Anemia in other chronic diseases classified elsewhere: Secondary | ICD-10-CM

## 2016-03-14 DIAGNOSIS — I272 Pulmonary hypertension, unspecified: Secondary | ICD-10-CM

## 2016-03-14 LAB — CBC WITH DIFFERENTIAL/PLATELET
BASOS ABS: 0.2 10*3/uL — AB (ref 0.0–0.1)
Basophils Relative: 1 %
Eosinophils Absolute: 0.7 10*3/uL (ref 0.0–0.7)
Eosinophils Relative: 4 %
HCT: 21.5 % — ABNORMAL LOW (ref 39.0–52.0)
HEMOGLOBIN: 7.3 g/dL — AB (ref 13.0–17.0)
LYMPHS ABS: 3 10*3/uL (ref 0.7–4.0)
Lymphocytes Relative: 17 %
MCH: 31.1 pg (ref 26.0–34.0)
MCHC: 34 g/dL (ref 30.0–36.0)
MCV: 91.5 fL (ref 78.0–100.0)
MONO ABS: 2.9 10*3/uL — AB (ref 0.1–1.0)
MONOS PCT: 16 %
NEUTROS PCT: 62 %
Neutro Abs: 11.1 10*3/uL — ABNORMAL HIGH (ref 1.7–7.7)
Platelets: 497 10*3/uL — ABNORMAL HIGH (ref 150–400)
RBC: 2.35 MIL/uL — AB (ref 4.22–5.81)
RDW: 21 % — AB (ref 11.5–15.5)
WBC: 17.9 10*3/uL — AB (ref 4.0–10.5)

## 2016-03-14 LAB — TROPONIN I
TROPONIN I: 0.17 ng/mL — AB (ref <0.03)
TROPONIN I: 0.17 ng/mL — AB (ref <0.03)
Troponin I: 0.24 ng/mL (ref <0.03)
Troponin I: 0.16 ng/mL (ref <0.03)

## 2016-03-14 LAB — COMPREHENSIVE METABOLIC PANEL
ALT: 23 U/L (ref 17–63)
AST: 43 U/L — AB (ref 15–41)
Albumin: 4.1 g/dL (ref 3.5–5.0)
Alkaline Phosphatase: 92 U/L (ref 38–126)
Anion gap: 8 (ref 5–15)
BILIRUBIN TOTAL: 3.2 mg/dL — AB (ref 0.3–1.2)
BUN: 13 mg/dL (ref 6–20)
CO2: 23 mmol/L (ref 22–32)
Calcium: 8.5 mg/dL — ABNORMAL LOW (ref 8.9–10.3)
Chloride: 106 mmol/L (ref 101–111)
Creatinine, Ser: 0.97 mg/dL (ref 0.61–1.24)
Glucose, Bld: 139 mg/dL — ABNORMAL HIGH (ref 65–99)
Potassium: 3.9 mmol/L (ref 3.5–5.1)
Sodium: 137 mmol/L (ref 135–145)
TOTAL PROTEIN: 7.7 g/dL (ref 6.5–8.1)

## 2016-03-14 LAB — RETICULOCYTES
RBC.: 2.38 MIL/uL — ABNORMAL LOW (ref 4.22–5.81)
Retic Count, Absolute: 535.5 10*3/uL — ABNORMAL HIGH (ref 19.0–186.0)
Retic Ct Pct: 22.5 % — ABNORMAL HIGH (ref 0.4–3.1)

## 2016-03-14 LAB — MAGNESIUM: MAGNESIUM: 1.8 mg/dL (ref 1.7–2.4)

## 2016-03-14 MED ORDER — HYDROMORPHONE HCL 2 MG/ML IJ SOLN
2.0000 mg | Freq: Once | INTRAMUSCULAR | Status: AC
  Administered 2016-03-14: 2 mg via INTRAVENOUS
  Filled 2016-03-14: qty 1

## 2016-03-14 NOTE — Progress Notes (Signed)
SICKLE CELL SERVICE PROGRESS NOTE  [name] [id number] DOB: [birth year] DOA: 03/13/2016 PCP: [name], MD  Assessment/Plan: Principal Problem:   Chest pain syndrome Active Problems:   Pulmonary HTN   Chronic anticoagulation   Anemia of chronic disease   Sickle cell anemia (HCC)   Troponin level elevated   Chest pain  1. Hb SS with crisis: Pt is in crisis likely as a direct result of hypoxia leading to sickling of cells in a setting of hypoxia. It is my opinion that for a long as patient is in an induced state of hypoxia associated with non-adherence to supplemental Oxygen, He will continue to go into crisis and present acutely. I will continue PCA and Toradol. He will likely require increased doses on the PCA as he has had repeated exposure to high dose Opiates which has likely increased his tolerance. Continue Toradol. Continue IVF for now as patient had contrast yesterday and is also on an NSAID so at risk for ATN.  2. 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 He admits that he was not adherent to using Oxygen. He is now requiring 5 L/min to maintain saturations at 88%. Will obtain ABG. Unfortunately I am sure that his pulmonary HTN is worsening as a result of prolonged Hypoxia and patient is still non-adherent to wearing Oxygen as prescribed. 3. Hypoxia: Pt has no evidence of pneumonia. It is my opinion that the findings on the CT scan are related to hypoxia and maybe constriction of vessels as the changes appear to occur in the periphery. Additionally he has no other clinical signs of a bacterial infection so will discontinue antibiotics and observe.  4. Prolonged QT Interval: No implicated drugs except Azithromycin which has been discontinued, Will check Magnesium levels.  5. Elevated Troponin: Suspect demand ischemia. Troponin trending down. Will ask Cardiology to see.  Continue ASA 81 mg. 6. Leukocytosis: Likely related to crisis.  7. Anemia of chronic disease:  Hb at baseline.  8. Chronic pain: Continue MS Contin 9. Chronic Anticoagulation: Pt on Xarelto for recurrent PE's. Continue Xarelto.   Code Status: Full Code Family Communication: N/A Disposition Plan: Not yet ready for discharge  [place].  Pager [phone]. If 7PM-7AM, please contact night-coverage.  03/14/2016, 2:26 PM  LOS: 1 day   Interim History: Pt presented to ED on 03/11/2016  a predictable crisis after he had been without Oxygen all day on the 8th as he was on a date. He reports that he was seen and managed int he ED on 03/11/2016 but was unable to really improve thus he presented to the ED on 03/13/2016 and was admitted. He reports thBNoneat he has been having pain in the left pre-cordial area and lateral ribs, currently at an intensity of  8/10 .    Consultants:  None  Procedures:  None  Antibiotics:  Azithromycin 2/11 >>2/12  Rocephin 2/11 >>2/12  Objective: Vitals:   03/14/16 0902 03/14/16 0954 03/14/16 1239 03/14/16 1400  BP:  124/90    Pulse:  77    Resp: 17 16 17 20   Temp:  98.8 F (37 C)    TempSrc:  Oral    SpO2: 93% 91% (!) 88% (!) 89%  Weight:      Height:       Weight change:   Intake/Output Summary (Last 24 hours) at 03/14/16 1426 Last data filed at 03/14/16 1346  Gross per 24 hour  Intake          3348.75 ml  Output             2850 ml  Net           498.75 ml    General: Alert, awake, oriented x3, in no acute distress.  HEENT: [place]/AT PEERL, EOMI, anicteric Neck: Trachea midline,  no masses, no thyromegal,y no JVD, no carotid bruit OROPHARYNX:  Moist, No exudate/ erythema/lesions.  Heart: Regular rate and rhythm, without murmurs, rubs, gallops, PMI non-displaced, no heaves or thrills on palpation.  Lungs: Clear to auscultation, no wheezing or rhonchi noted. No increased vocal fremitus resonant to percussion  Abdomen: Soft, nontender,  nondistended, positive bowel sounds, no masses no hepatosplenomegaly noted.  Neuro: No focal neurological deficits noted cranial nerves II through XII grossly intact. . Strength at functional baseline in bilateral upper and lower extremities. Musculoskeletal: No warmth swelling or erythema around joints, no spinal tenderness noted. Psychiatric: Patient alert and oriented x3, good insight and cognition, good recent to remote recall.    Data Reviewed: Basic Metabolic Panel:  Recent Labs Lab 03/08/16 0624 03/09/16 0700 03/11/16 0830 03/13/16 0820 03/14/16 0222  NA 137 138 137 138 137  K 3.8 3.4* 3.8 3.8 3.9  CL 109 107 107 109 106  CO2 24 25 23 22 23   GLUCOSE 122* 121* 95 127* 139*  BUN 10 11 8 8 13   CREATININE 0.79 0.92 0.88 0.86 0.97  CALCIUM 8.5* 8.5* 9.3 8.9 8.5*   Liver Function Tests:  Recent Labs Lab 03/11/16 0830 03/13/16 0820 03/14/16 0222  AST 41 39 43*  ALT 30 24 23   ALKPHOS 85 90 92  BILITOT 4.1* 5.0* 3.2*  PROT 7.5 7.7 7.7  ALBUMIN 4.2 4.3 4.1   No results for input(s): LIPASE, AMYLASE in the last 168 hours. No results for input(s): AMMONIA in the last 168 hours. CBC:  Recent Labs Lab 03/08/16 0624 03/11/16 0830 03/13/16 0820 03/14/16 0222  WBC 17.4* 14.3* 15.5* 17.9*  NEUTROABS 10.6* 11.1* 9.1* 11.1*  HGB 7.0* 7.3* 7.4* 7.3*  HCT 20.6* 21.0* 21.6* 21.5*  MCV 92.8 90.5 90.4 91.5  PLT 553* 528* 514* 497*   Cardiac Enzymes:  Recent Labs Lab 03/13/16 1414 03/13/16 2014 03/14/16 0222 03/14/16 0809  TROPONINI 0.30* 0.31* 0.24* 0.16*   BNP (last 3 results)  Recent Labs  11/28/15 0520 02/11/16 1006 03/07/16 0535  BNP 422.5* 381.0* 319.6*    ProBNP (last 3 results) No results for input(s): PROBNP in the last 8760 hours.  CBG: No results for input(s): GLUCAP in the last 168 hours.  Recent Results (from the past 240 hour(s))  Culture, blood (routine x 2)     Status: None (Preliminary result)   Collection Time: 03/13/16 10:40 AM   Result Value Ref Range Status   Specimen Description BLOOD RIGHT HAND  Final   Special Requests BOTTLES DRAWN AEROBIC ONLY [place]  Final   Culture   Final    NO GROWTH < 24 HOURS Performed at [facility], [place] [address], [place], [place] [address]    Report Status PENDING  Incomplete     Studies: Dg Chest 2 View  Result Date: 03/11/2016 CLINICAL DATA:  Sickle cell pain crisis with bilateral chest pain beginning this morning. EXAM: CHEST  2 VIEW COMPARISON:  03/06/2016 FINDINGS: Moderate cardiomegaly remains stable. Left-sided power port remains in appropriate position. Mild scarring and peripheral right midlung is unchanged. No evidence  of acute pulmonary infiltrate or edema. No evidence of pleural effusion. IMPRESSION: Stable cardiomegaly.  No active lung disease. Electronically Signed   By: Earle Gell M.D.   On: 03/11/2016 08:55   Dg Chest 2 View  Result Date: 03/06/2016 CLINICAL DATA:  36 year old male with right rib pain and sickle cell crisis. Initial encounter. EXAM: CHEST  2 VIEW COMPARISON:  02/29/2016 and earlier. FINDINGS: Stable cardiomegaly and mediastinal contours. Stable left chest power port, currently accessed. Interval resolved patchy and nodular right upper lung opacity. No pneumothorax, pulmonary edema, pleural effusion or acute pulmonary opacity. Stable basilar predominant mild chronic increased interstitial markings. Negative visible bowel gas pattern. Stable cholecystectomy clips. No acute osseous abnormality identified. IMPRESSION: Interval resolved right upper lobe opacity. Stable cardiomegaly with no acute cardiopulmonary abnormality. Electronically Signed   By: Genevie Ann M.D.   On: 03/06/2016 10:46   Dg Chest 2 View  Result Date: 02/29/2016 CLINICAL DATA:  Hypoxia and weakness.  Sickle cell. EXAM: CHEST  2 VIEW COMPARISON:  02/23/2016. FINDINGS: Trachea is midline. Left subclavian power port tip is in the high right atrium. Heart is enlarged, stable. Mild patchy  right upper lobe airspace opacification. Streaky atelectasis or scarring in the left lower lobe. No pleural fluid. IMPRESSION: Mild right upper lobe patchy airspace opacification. Electronically Signed   By: Lorin Picket M.D.   On: 02/29/2016 12:53   Dg Chest 2 View  Result Date: 02/23/2016 CLINICAL DATA:  SICKLE CELL CRISIS EXAM: CHEST  2 VIEW COMPARISON:  CT 02/21/2016 FINDINGS: LEFT-SIDED POWER PORT UNCHANGED. STABLE CARDIAC SILHOUETTE. THERE IS MILD CENTRAL VENOUS CONGESTION. NO FOCAL INFILTRATE. NO PLEURAL FLUID. NO PNEUMOTHORAX. IMPRESSION: 1. STABLE CARDIOMEGALY. 2. STABLE CENTRAL VENOUS PULMONARY CONGESTION. 3. NO EVIDENCE OF PULMONARY INFILTRATE Electronically Signed   By: Suzy Bouchard M.D.   On: 02/23/2016 16:35   Dg Chest 2 View  Result Date: 02/21/2016 CLINICAL DATA:  New onset chest pain beginning today. EXAM: CHEST  2 VIEW COMPARISON:  02/19/2016 FINDINGS: Chronic enlargement of the cardiac silhouette. Power port tip in the SVC just above the right atrium as seen previously. Pulmonary venous hypertension, possibly with early interstitial edema. No advanced edema. No consolidation or collapse. No effusions. IMPRESSION: Chronic cardiomegaly. Worsening of the pulmonary vascular pattern suggesting venous hypertension with early interstitial edema. Electronically Signed   By: Nelson Chimes M.D.   On: 02/21/2016 07:49   Dg Chest 2 View  Result Date: 02/19/2016 CLINICAL DATA:  Sickle cell pain crisis, worsening bilat lower chest pain since yesterday EXAM: CHEST  2 VIEW COMPARISON:  02/11/2016 FINDINGS: Left Port-A-Cath is unchanged in position. Midline trachea. Moderate cardiomegaly. Mediastinal contours otherwise within normal limits. No pleural effusion or pneumothorax. No congestive failure. Lower lobe predominant pulmonary interstitial thickening is chronic and nonspecific. No lobar consolidation. IMPRESSION: Cardiomegaly with chronic interstitial thickening. No acute findings.  Electronically Signed   By: Abigail Miyamoto M.D.   On: 02/19/2016 07:18   Ct Angio Chest Pe W And/or Wo Contrast  Result Date: 03/13/2016 CLINICAL DATA:  History of sickle cell. Left-sided chest pain starting this morning about 3 a.m. EXAM: CT ANGIOGRAPHY CHEST WITH CONTRAST TECHNIQUE: Multidetector CT imaging of the chest was performed using the standard protocol during bolus administration of intravenous contrast. Multiplanar CT image reconstructions and MIPs were obtained to evaluate the vascular anatomy. CONTRAST:  100 mL Isovue 370 COMPARISON:  CT chest 03/06/2016, 02/21/2016, 01/05/2016 FINDINGS: Cardiovascular: Satisfactory opacification of the pulmonary arteries to the segmental level. No evidence of pulmonary  embolism. Stable cardiomegaly. No pericardial effusion. Normal caliber thoracic aorta. No thoracic aortic atherosclerosis or dissection. Left-sided Port-A-Cath again noted. Mediastinum/Nodes: Thyroid gland, trachea, and esophagus demonstrate no significant findings. Multiple small prevascular lymph nodes unchanged compared with the prior exams. Mildly enlarged stable subcarinal lymph node. No axillary lymphadenopathy. Lungs/Pleura: Patchy areas of ground-glass opacities in bilateral upper lobes and lower lobes which may reflect mild pulmonary edema versus alveolitis secondary to an infectious or inflammatory etiology. No pleural effusion or pneumothorax. Upper Abdomen: No acute abnormality in the upper abdomen. Chronic calcified, involuted spleen. Musculoskeletal: No acute osseous abnormality. No lytic or sclerotic osseous lesion. Soft tissue: Bilateral retroareolar fibroglandular soft tissue as can be seen with gynecomastia. Review of the MIP images confirms the above findings. IMPRESSION: 1. No evidence of acute pulmonary embolus. 2. Stable cardiomegaly. 3. Patchy areas of ground-glass opacities in bilateral upper lobes and lower lobes which may reflect mild pulmonary edema versus alveolitis  secondary to an infectious or inflammatory etiology. Electronically Signed   By: Kathreen Devoid   On: 03/13/2016 10:13   Ct Angio Chest Pe W And/or Wo Contrast  Result Date: 03/06/2016 CLINICAL DATA:  Chest pain, new hypoxia.  Sickle cell disease. EXAM: CT ANGIOGRAPHY CHEST WITH CONTRAST TECHNIQUE: Multidetector CT imaging of the chest was performed using the standard protocol during bolus administration of intravenous contrast. Multiplanar CT image reconstructions and MIPs were obtained to evaluate the vascular anatomy. CONTRAST:  100 cc Isovue 370 Additional 80 cc Isovue 370 given due to port leakage and patient motion. COMPARISON:  Chest CT angiogram dated 02/21/2016. FINDINGS: Cardiovascular: Despite repeat imaging, the peripheral segmental and subsegmental pulmonary arteries to the lower lobes are difficult to definitively characterize due to patient motion artifact and suboptimal contrast opacification. There is, however, no pulmonary embolism identified within the main, lobar or central segmental pulmonary arteries bilaterally. Patient has had chronic lower lobe subsegmental pulmonary emboli described on previous exams, not able to be delineated again on today's study due to the patient motion artifact and suboptimal contrast opacification. Thoracic aorta is normal in caliber and configuration. Heart is prominently enlarged, stable compared to the previous exam. No pericardial effusion seen. Mediastinum/Nodes: Numerous small and moderate-sized lymph nodes within the anterior mediastinum are stable. Normal residual thymic tissue is also noted within the anterior mediastinum. Esophagus appears normal. Trachea and central bronchi are unremarkable. Lungs/Pleura: Mild bibasilar scarring/atelectasis is stable. Lungs otherwise clear. No evidence of pneumonia. No pleural effusion or pneumothorax. Upper Abdomen: Limited images of the upper abdomen are unremarkable. Musculoskeletal: No acute or suspicious osseous  lesion. Superficial soft tissues are unremarkable. Review of the MIP images confirms the above findings. IMPRESSION: 1. No acute pulmonary embolus identified, with mild study limitations detailed above. 2. Stable cardiomegaly. 3. No pneumonia or pulmonary edema. Stable mild scarring/fibrosis at the lung bases. 4. Stable mediastinal lymphadenopathy. Electronically Signed   By: Franki Cabot M.D.   On: 03/06/2016 13:30   Ct Angio Chest Pe W Or Wo Contrast  Result Date: 02/21/2016 CLINICAL DATA:  Sickle cell disease.  Chest pain, bilateral rib pain EXAM: CT ANGIOGRAPHY CHEST WITH CONTRAST TECHNIQUE: Multidetector CT imaging of the chest was performed using the standard protocol during bolus administration of intravenous contrast. Multiplanar CT image reconstructions and MIPs were obtained to evaluate the vascular anatomy. CONTRAST:  100 mL Isovue 370 COMPARISON:  01/05/2016, 11/10/2015 FINDINGS: Cardiovascular: Satisfactory opacification of the central and lobar pulmonary arteries with somewhat suboptimal opacification of the subsegmental pulmonary arteries. Chronic bilateral lower lobe  subsegmental occlusive pulmonary emboli. No acute pulmonary embolus. Main pulmonary artery is enlarged measuring 4.2 cm in diameter. Right heart strain (RV/ LV equals 1.0). Severe cardiomegaly. Trace pericardial effusion. Thoracic aorta is normal in caliber. No thoracic aortic dissection or aneurysm. Mediastinum/Nodes: Mild mediastinal lymphadenopathy is unchanged compared with multiple prior examinations. Left-sided Port-A-Cath with the tip at the cavoatrial junction. Normal esophagus. Normal thyroid. Lungs/Pleura: No pleural effusion or pneumothorax. Bilateral patchy areas of ground-glass opacity in the upper and lower lobes both central and peripheral distribution similar in appearance to the prior exam. No focal consolidation, pleural effusion or pneumothorax. Upper Abdomen: Involuted and calcified spleen consistent with history  of sickle cell disease. No acute upper abdominal abnormality. Musculoskeletal: No acute osseous abnormality. 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 Review of the MIP images confirms the above findings. IMPRESSION: 1. No acute pulmonary embolus. Chronic bilateral lower lobe subsegmental pulmonary emboli unchanged in the prior exam. Chronic right heart strain with dilatation of the main pulmonary artery as can be seen with pulmonary arterial hypertension. 2. Stable cardiomegaly. 3. Patchy ground-glass opacities bilaterally suggesting mild pulmonary edema versus an infectious or inflammatory etiology. 4. Stable mediastinal lymphadenopathy. Electronically Signed   By: Kathreen Devoid   On: 02/21/2016 10:46    Scheduled Meds: . ambrisentan  5 mg Oral Daily  . aspirin  81 mg Oral Daily  . azithromycin  500 mg Intravenous Q24H  . cefTRIAXone (ROCEPHIN)  IV  1 g Intravenous Q24H  . cholecalciferol  2,000 Units Oral Daily  . Deferasirox  1,080 mg Oral Daily  . folic acid  1 mg Oral Daily  . gabapentin  300 mg Oral TID  . HYDROmorphone   Intravenous Q4H  . ketorolac  30 mg Intravenous Q6H  . morphine  30 mg Oral Q12H  . potassium chloride SA  40 mEq Oral Daily  . rivaroxaban  20 mg Oral QAC supper  . senna-docusate  1 tablet Oral BID   Continuous Infusions: . dextrose 5 % and 0.45% NaCl 125 mL/hr at 03/14/16 1101    Principal Problem:   Chest pain syndrome Active Problems:   Pulmonary HTN   Chronic anticoagulation   Anemia of chronic disease   Sickle cell anemia (HCC)   Troponin level elevated   Chest pain    In excess of 25 minutes spent during this visit. Greater than 50% involved face to face contact with the patient for assessment, counseling and coordination of care.

## 2016-03-14 NOTE — Progress Notes (Signed)
-  Chaplain stopped by to visit with patient while rounding.  Chaplain introduced herself to patient.  Patient expressed that he was doing better.  Patient was eating breakfast.  Chaplain shared services provided by [facility] with patient and asked the patient to place a consult should he need to chat with one of [place].   Dorna Bloom Resident Pager [phone]

## 2016-03-15 ENCOUNTER — Ambulatory Visit: Payer: Self-pay | Admitting: Internal Medicine

## 2016-03-15 DIAGNOSIS — D5700 Hb-SS disease with crisis, unspecified: Secondary | ICD-10-CM

## 2016-03-15 LAB — CBC WITH DIFFERENTIAL/PLATELET
BASOS ABS: 0 10*3/uL (ref 0.0–0.1)
Band Neutrophils: 0 %
Basophils Relative: 0 %
Blasts: 0 %
EOS PCT: 2 %
Eosinophils Absolute: 0.2 10*3/uL (ref 0.0–0.7)
HCT: 21 % — ABNORMAL LOW (ref 39.0–52.0)
Hemoglobin: 7.1 g/dL — ABNORMAL LOW (ref 13.0–17.0)
Lymphocytes Relative: 24 %
Lymphs Abs: 2.8 10*3/uL (ref 0.7–4.0)
MCH: 31.1 pg (ref 26.0–34.0)
MCHC: 33.8 g/dL (ref 30.0–36.0)
MCV: 92.1 fL (ref 78.0–100.0)
METAMYELOCYTES PCT: 0 %
MONO ABS: 0.5 10*3/uL (ref 0.1–1.0)
MYELOCYTES: 0 %
Monocytes Relative: 4 %
Neutro Abs: 8.2 10*3/uL — ABNORMAL HIGH (ref 1.7–7.7)
Neutrophils Relative %: 70 %
PLATELETS: 495 10*3/uL — AB (ref 150–400)
Promyelocytes Absolute: 0 %
RBC: 2.28 MIL/uL — AB (ref 4.22–5.81)
RDW: 22.5 % — ABNORMAL HIGH (ref 11.5–15.5)
WBC: 11.7 10*3/uL — AB (ref 4.0–10.5)
nRBC: 0 /100 WBC

## 2016-03-15 LAB — TROPONIN I
TROPONIN I: 0.19 ng/mL — AB (ref <0.03)
TROPONIN I: 0.18 ng/mL — AB (ref <0.03)
TROPONIN I: 0.2 ng/mL — AB (ref <0.03)
Troponin I: 0.17 ng/mL (ref <0.03)

## 2016-03-15 LAB — RETICULOCYTES
RBC.: 2.28 MIL/uL — AB (ref 4.22–5.81)
Retic Ct Pct: >23 % — ABNORMAL HIGH (ref 0.4–3.1)

## 2016-03-15 LAB — BASIC METABOLIC PANEL
ANION GAP: 6 (ref 5–15)
BUN: 12 mg/dL (ref 6–20)
CO2: 22 mmol/L (ref 22–32)
Calcium: 8.2 mg/dL — ABNORMAL LOW (ref 8.9–10.3)
Chloride: 108 mmol/L (ref 101–111)
Creatinine, Ser: 0.9 mg/dL (ref 0.61–1.24)
GFR calc Af Amer: >60 mL/min (ref >60)
Glucose, Bld: 190 mg/dL — ABNORMAL HIGH (ref 65–99)
POTASSIUM: 4.1 mmol/L (ref 3.5–5.1)
SODIUM: 136 mmol/L (ref 135–145)

## 2016-03-15 LAB — BLOOD GAS, ARTERIAL
Acid-base deficit: 3.1 mmol/L — ABNORMAL HIGH (ref 0.0–2.0)
Bicarbonate: 21.6 mmol/L (ref 20.0–28.0)
Drawn by: 257701
O2 CONTENT: 5 L/min
O2 Saturation: 89.8 %
PO2 ART: 71.8 mmHg — AB (ref 83.0–108.0)
Patient temperature: 98.6
pCO2 arterial: 39.9 mmHg (ref 32.0–48.0)
pH, Arterial: 7.352 (ref 7.350–7.450)

## 2016-03-15 MED ORDER — HYDROMORPHONE 1 MG/ML IV SOLN
INTRAVENOUS | Status: DC
  Administered 2016-03-15: 2 mg via INTRAVENOUS
  Administered 2016-03-15: 18:00:00 via INTRAVENOUS
  Administered 2016-03-15: 2 mg via INTRAVENOUS
  Administered 2016-03-16: 18.4 mg via INTRAVENOUS
  Administered 2016-03-16: 10.4 mg via INTRAVENOUS
  Administered 2016-03-16: 2 mg via INTRAVENOUS
  Administered 2016-03-16: 01:00:00 via INTRAVENOUS
  Administered 2016-03-16: 9.2 mg via INTRAVENOUS
  Administered 2016-03-16: 6.99 mg via INTRAVENOUS
  Administered 2016-03-16: 2 mg via INTRAVENOUS
  Administered 2016-03-16: 12:00:00 via INTRAVENOUS
  Filled 2016-03-15 (×2): qty 25

## 2016-03-15 NOTE — Consult Note (Signed)
   [facility] CM Inpatient Consult   03/15/2016  SELIM TORUNO 07/23/1980 [id number]   Received [facility] Care Management referral. Spoke with Mr. Canlas at bedside to discuss re-engaging with [place] Management program. He declines [facility] Care Management services once again. Will make inpatient RNCM aware. [facility] Care Management contact information left at bedside.   Marthenia Rolling, MSN-Ed, RN,BSN [facility] Liaison [phone]

## 2016-03-15 NOTE — Care Management Note (Signed)
Case Management Note  Patient Details  Name: [name] MRN: [id number] Date of Birth: 1980/07/26  Subjective/Objective:   Pt admitted with Chest pain syndrome                 Action/Plan: Plan to discharge home   Expected Discharge Date:    03/19/2016              Expected Discharge Plan:  Home/Self Care  In-House Referral:  NA  Discharge planning Services  CM Consult  Post Acute Care Choice:  NA Choice offered to:  NA  DME Arranged:  N/A DME Agency:     HH Arranged:  NA HH Agency:  NA  Status of Service:  In process, will continue to follow  If discussed at Long Length of Stay Meetings, dates discussed:    Additional CommentsPurcell Mouton, RN 03/15/2016, 3:19 PM

## 2016-03-15 NOTE — Progress Notes (Signed)
SICKLE CELL SERVICE PROGRESS NOTE  [name] [id number] DOB: 17-Sep-1980 DOA: 03/13/2016 PCP: [name], MD  Assessment/Plan: Principal Problem:   Chest pain syndrome Active Problems:   Pulmonary HTN   Chronic anticoagulation   Anemia of chronic disease   Sickle cell anemia (HCC)   Troponin level elevated   Chest pain  1. Hb SS with crisis: Pt is in crisis likely as a direct result of hypoxia leading to sickling of cells in a setting of hypoxia. It is my opinion that for a long as patient is in an induced state of hypoxia associated with non-adherence to supplemental Oxygen, He will continue to go into crisis and present acutely. I will increase  PCA dose to 0.6 mg and continue Toradol. He will likely require increased doses on the PCA as he has had repeated exposure to high dose Opiates which has likely increased his tolerance. Decrease IVF and check renal function tomorrow. 2. 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 He admits that he was not adherent to using Oxygen. He is now requiring 5 L/min to maintain saturations at 88%. Will obtain ABG. Unfortunately I am sure that his pulmonary HTN is worsening as a result of prolonged Hypoxia and patient is still non-adherent to wearing Oxygen as prescribed. 3. Hypoxia: Pt has no evidence of pneumonia. It is my opinion that the findings on the CT scan are related to hypoxia and maybe constriction of vessels as the changes appear to occur in the periphery. Additionally he has no other clinical signs of a bacterial infection so will discontinue antibiotics and observe.  4. Prolonged QT Interval: No implicated drugs except Azithromycin which has been discontinued, Will check Magnesium levels.  5. Elevated Troponin: Suspect demand ischemia. Troponin trending down. Will ask Cardiology to see. Continue ASA 81 mg. 6. Leukocytosis: Likely  related to crisis.  7. Anemia of chronic disease:  Hb at baseline.  8. Chronic pain: Continue MS Contin 9. Chronic Anticoagulation: Pt on Xarelto for recurrent PE's. Continue Xarelto.   Code Status: Full Code Family Communication: N/A Disposition Plan: Not yet ready for discharge  [place].  Pager [phone]. If 7PM-7AM, please contact night-coverage.  03/15/2016, 5:05 PM  LOS: 2 days   Interim History: Pt presented to ED on 03/11/2016  a predictable crisis after he had been without Oxygen all day on the 8th as he was on a date. He reports that he was seen and managed int he ED on 03/11/2016 but was unable to really improve thus he presented to the ED on 03/13/2016 and was admitted. He reports that the pain in the [re-cordial; region has resolved. However he is still  having pain in the lateral ribs, currently at an intensity of  7/10 .    Consultants:  None  Procedures:  None  Antibiotics:  Azithromycin 2/11 >>2/12  Rocephin 2/11 >>2/12  Objective: Vitals:   03/15/16 0831 03/15/16 1146 03/15/16 1500 03/15/16 1508  BP:   119/80   Pulse:   79   Resp: 14 18 16 18   Temp:   97.7 F (36.5 C)   TempSrc:   Oral   SpO2: 95% 94% 90% 90%  Weight:      Height:       Weight change:   Intake/Output Summary (Last 24 hours) at 03/15/16 1705 Last data filed at 03/15/16 1500  Gross per 24 hour  Intake          2794.92 ml  Output             1625 ml  Net          1169.92 ml    General: Alert, awake, oriented x3, in no acute distress.  HEENT: [place]/AT PEERL, EOMI, anicteric Neck: Trachea midline,  no masses, no thyromegal,y no JVD, no carotid bruit OROPHARYNX:  Moist, No exudate/ erythema/lesions.  Heart: Regular rate and rhythm, without murmurs, rubs, gallops, PMI non-displaced, no heaves or thrills on palpation.  Lungs: Clear to auscultation, no wheezing or rhonchi noted. No increased vocal fremitus resonant to percussion  Abdomen: Soft, nontender, nondistended, positive  bowel sounds, no masses no hepatosplenomegaly noted.  Neuro: No focal neurological deficits noted cranial nerves II through XII grossly intact. . Strength at functional baseline in bilateral upper and lower extremities. Musculoskeletal: No warmth swelling or erythema around joints, no spinal tenderness noted. Psychiatric: Patient alert and oriented x3, good insight and cognition, good recent to remote recall.    Data Reviewed: Basic Metabolic Panel:  Recent Labs Lab 03/09/16 0700 03/11/16 0830 03/13/16 0820 03/14/16 0222 03/14/16 1348 03/15/16 0531  NA 138 137 138 137  --  136  K 3.4* 3.8 3.8 3.9  --  4.1  CL 107 107 109 106  --  108  CO2 25 23 22 23   --  22  GLUCOSE 121* 95 127* 139*  --  190*  BUN 11 8 8 13   --  12  CREATININE 0.92 0.88 0.86 0.97  --  0.90  CALCIUM 8.5* 9.3 8.9 8.5*  --  8.2*  MG  --   --   --   --  1.8  --    Liver Function Tests:  Recent Labs Lab 03/11/16 0830 03/13/16 0820 03/14/16 0222  AST 41 39 43*  ALT 30 24 23   ALKPHOS 85 90 92  BILITOT 4.1* 5.0* 3.2*  PROT 7.5 7.7 7.7  ALBUMIN 4.2 4.3 4.1   No results for input(s): LIPASE, AMYLASE in the last 168 hours. No results for input(s): AMMONIA in the last 168 hours. CBC:  Recent Labs Lab 03/11/16 0830 03/13/16 0820 03/14/16 0222 03/15/16 1123  WBC 14.3* 15.5* 17.9* 11.7*  NEUTROABS 11.1* 9.1* 11.1* 8.2*  HGB 7.3* 7.4* 7.3* 7.1*  HCT 21.0* 21.6* 21.5* 21.0*  MCV 90.5 90.4 91.5 92.1  PLT 528* 514* 497* 495*   Cardiac Enzymes:  Recent Labs Lab 03/14/16 1348 03/14/16 1954 03/15/16 0203 03/15/16 0816 03/15/16 1431  TROPONINI 0.17* 0.17* 0.19* 0.18* 0.17*   BNP (last 3 results)  Recent Labs  11/28/15 0520 02/11/16 1006 03/07/16 0535  BNP 422.5* 381.0* 319.6*    ProBNP (last 3 results) No results for input(s): PROBNP in the last 8760 hours.  CBG: No results for input(s): GLUCAP in the last 168 hours.  Recent Results (from the past 240 hour(s))  Culture, blood (routine  x 2)     Status: None (Preliminary result)   Collection Time: 03/13/16 10:40 AM  Result Value Ref Range Status   Specimen Description BLOOD RIGHT HAND  Final   Special Requests BOTTLES DRAWN AEROBIC ONLY [place]  Final   Culture   Final    NO GROWTH 2 DAYS Performed at [facility], [address], [place], [place] [address]    Report Status PENDING  Incomplete  Culture, blood (routine x 2)     Status: None (Preliminary result)   Collection  Time: 03/13/16 10:50 AM  Result Value Ref Range Status   Specimen Description BLOOD PORTA CATH  Final   Special Requests BOTTLES DRAWN AEROBIC AND ANAEROBIC 5 CC EACH  Final   Culture   Final    NO GROWTH 2 DAYS Performed at [facility], [address], [place], [place] [address]    Report Status PENDING  Incomplete     Studies: Dg Chest 2 View  Result Date: 03/11/2016 CLINICAL DATA:  Sickle cell pain crisis with bilateral chest pain beginning this morning. EXAM: CHEST  2 VIEW COMPARISON:  03/06/2016 FINDINGS: Moderate cardiomegaly remains stable. Left-sided power port remains in appropriate position. Mild scarring and peripheral right midlung is unchanged. No evidence of acute pulmonary infiltrate or edema. No evidence of pleural effusion. IMPRESSION: Stable cardiomegaly.  No active lung disease. Electronically Signed   By: Earle Gell M.D.   On: 03/11/2016 08:55   Dg Chest 2 View  Result Date: 03/06/2016 CLINICAL DATA:  36 year old male with right rib pain and sickle cell crisis. Initial encounter. EXAM: CHEST  2 VIEW COMPARISON:  02/29/2016 and earlier. FINDINGS: Stable cardiomegaly and mediastinal contours. Stable left chest power port, currently accessed. Interval resolved patchy and nodular right upper lung opacity. No pneumothorax, pulmonary edema, pleural effusion or acute pulmonary opacity. Stable basilar predominant mild chronic increased interstitial markings. Negative visible bowel gas pattern. Stable cholecystectomy clips. No acute  osseous abnormality identified. IMPRESSION: Interval resolved right upper lobe opacity. Stable cardiomegaly with no acute cardiopulmonary abnormality. Electronically Signed   By: Genevie Ann M.D.   On: 03/06/2016 10:46   Dg Chest 2 View  Result Date: 02/29/2016 CLINICAL DATA:  Hypoxia and weakness.  Sickle cell. EXAM: CHEST  2 VIEW COMPARISON:  02/23/2016. FINDINGS: Trachea is midline. Left subclavian power port tip is in the high right atrium. Heart is enlarged, stable. Mild patchy right upper lobe airspace opacification. Streaky atelectasis or scarring in the left lower lobe. No pleural fluid. IMPRESSION: Mild right upper lobe patchy airspace opacification. Electronically Signed   By: Lorin Picket M.D.   On: 02/29/2016 12:53   Dg Chest 2 View  Result Date: 02/23/2016 CLINICAL DATA:  SICKLE CELL CRISIS EXAM: CHEST  2 VIEW COMPARISON:  CT 02/21/2016 FINDINGS: LEFT-SIDED POWER PORT UNCHANGED. STABLE CARDIAC SILHOUETTE. THERE IS MILD CENTRAL VENOUS CONGESTION. NO FOCAL INFILTRATE. NO PLEURAL FLUID. NO PNEUMOTHORAX. IMPRESSION: 1. STABLE CARDIOMEGALY. 2. STABLE CENTRAL VENOUS PULMONARY CONGESTION. 3. NO EVIDENCE OF PULMONARY INFILTRATE Electronically Signed   By: Suzy Bouchard M.D.   On: 02/23/2016 16:35   Dg Chest 2 View  Result Date: 02/21/2016 CLINICAL DATA:  New onset chest pain beginning today. EXAM: CHEST  2 VIEW COMPARISON:  02/19/2016 FINDINGS: Chronic enlargement of the cardiac silhouette. Power port tip in the SVC just above the right atrium as seen previously. Pulmonary venous hypertension, possibly with early interstitial edema. No advanced edema. No consolidation or collapse. No effusions. IMPRESSION: Chronic cardiomegaly. Worsening of the pulmonary vascular pattern suggesting venous hypertension with early interstitial edema. Electronically Signed   By: Nelson Chimes M.D.   On: 02/21/2016 07:49   Dg Chest 2 View  Result Date: 02/19/2016 CLINICAL DATA:  Sickle cell pain crisis, worsening  bilat lower chest pain since yesterday EXAM: CHEST  2 VIEW COMPARISON:  02/11/2016 FINDINGS: Left Port-A-Cath is unchanged in position. Midline trachea. Moderate cardiomegaly. Mediastinal contours otherwise within normal limits. No pleural effusion or pneumothorax. No congestive failure. Lower lobe predominant pulmonary interstitial thickening is chronic and  nonspecific. No lobar consolidation. IMPRESSION: Cardiomegaly with chronic interstitial thickening. No acute findings. Electronically Signed   By: Abigail Miyamoto M.D.   On: 02/19/2016 07:18   Ct Angio Chest Pe W And/or Wo Contrast  Result Date: 03/13/2016 CLINICAL DATA:  History of sickle cell. Left-sided chest pain starting this morning about 3 a.m. EXAM: CT ANGIOGRAPHY CHEST WITH CONTRAST TECHNIQUE: Multidetector CT imaging of the chest was performed using the standard protocol during bolus administration of intravenous contrast. Multiplanar CT image reconstructions and MIPs were obtained to evaluate the vascular anatomy. CONTRAST:  100 mL Isovue 370 COMPARISON:  CT chest 03/06/2016, 02/21/2016, 01/05/2016 FINDINGS: Cardiovascular: Satisfactory opacification of the pulmonary arteries to the segmental level. No evidence of pulmonary embolism. Stable cardiomegaly. No pericardial effusion. Normal caliber thoracic aorta. No thoracic aortic atherosclerosis or dissection. Left-sided Port-A-Cath again noted. Mediastinum/Nodes: Thyroid gland, trachea, and esophagus demonstrate no significant findings. Multiple small prevascular lymph nodes unchanged compared with the prior exams. Mildly enlarged stable subcarinal lymph node. No axillary lymphadenopathy. Lungs/Pleura: Patchy areas of ground-glass opacities in bilateral upper lobes and lower lobes which may reflect mild pulmonary edema versus alveolitis secondary to an infectious or inflammatory etiology. No pleural effusion or pneumothorax. Upper Abdomen: No acute abnormality in the upper abdomen. Chronic calcified,  involuted spleen. Musculoskeletal: No acute osseous abnormality. No lytic or sclerotic osseous lesion. Soft tissue: Bilateral retroareolar fibroglandular soft tissue as can be seen with gynecomastia. Review of the MIP images confirms the above findings. IMPRESSION: 1. No evidence of acute pulmonary embolus. 2. Stable cardiomegaly. 3. Patchy areas of ground-glass opacities in bilateral upper lobes and lower lobes which may reflect mild pulmonary edema versus alveolitis secondary to an infectious or inflammatory etiology. Electronically Signed   By: Kathreen Devoid   On: 03/13/2016 10:13   Ct Angio Chest Pe W And/or Wo Contrast  Result Date: 03/06/2016 CLINICAL DATA:  Chest pain, new hypoxia.  Sickle cell disease. EXAM: CT ANGIOGRAPHY CHEST WITH CONTRAST TECHNIQUE: Multidetector CT imaging of the chest was performed using the standard protocol during bolus administration of intravenous contrast. Multiplanar CT image reconstructions and MIPs were obtained to evaluate the vascular anatomy. CONTRAST:  100 cc Isovue 370 Additional 80 cc Isovue 370 given due to port leakage and patient motion. COMPARISON:  Chest CT angiogram dated 02/21/2016. FINDINGS: Cardiovascular: Despite repeat imaging, the peripheral segmental and subsegmental pulmonary arteries to the lower lobes are difficult to definitively characterize due to patient motion artifact and suboptimal contrast opacification. There is, however, no pulmonary embolism identified within the main, lobar or central segmental pulmonary arteries bilaterally. Patient has had chronic lower lobe subsegmental pulmonary emboli described on previous exams, not able to be delineated again on today's study due to the patient motion artifact and suboptimal contrast opacification. Thoracic aorta is normal in caliber and configuration. Heart is prominently enlarged, stable compared to the previous exam. No pericardial effusion seen. Mediastinum/Nodes: Numerous small and moderate-sized  lymph nodes within the anterior mediastinum are stable. Normal residual thymic tissue is also noted within the anterior mediastinum. Esophagus appears normal. Trachea and central bronchi are unremarkable. Lungs/Pleura: Mild bibasilar scarring/atelectasis is stable. Lungs otherwise clear. No evidence of pneumonia. No pleural effusion or pneumothorax. Upper Abdomen: Limited images of the upper abdomen are unremarkable. Musculoskeletal: No acute or suspicious osseous lesion. Superficial soft tissues are unremarkable. Review of the MIP images confirms the above findings. IMPRESSION: 1. No acute pulmonary embolus identified, with mild study limitations detailed above. 2. Stable cardiomegaly. 3. No pneumonia or pulmonary  edema. Stable mild scarring/fibrosis at the lung bases. 4. Stable mediastinal lymphadenopathy. Electronically Signed   By: Franki Cabot M.D.   On: 03/06/2016 13:30   Ct Angio Chest Pe W Or Wo Contrast  Result Date: 02/21/2016 CLINICAL DATA:  Sickle cell disease.  Chest pain, bilateral rib pain EXAM: CT ANGIOGRAPHY CHEST WITH CONTRAST TECHNIQUE: Multidetector CT imaging of the chest was performed using the standard protocol during bolus administration of intravenous contrast. Multiplanar CT image reconstructions and MIPs were obtained to evaluate the vascular anatomy. CONTRAST:  100 mL Isovue 370 COMPARISON:  01/05/2016, 11/10/2015 FINDINGS: Cardiovascular: Satisfactory opacification of the central and lobar pulmonary arteries with somewhat suboptimal opacification of the subsegmental pulmonary arteries. Chronic bilateral lower lobe subsegmental occlusive pulmonary emboli. No acute pulmonary embolus. Main pulmonary artery is enlarged measuring 4.2 cm in diameter. Right heart strain (RV/ LV equals 1.0). Severe cardiomegaly. Trace pericardial effusion. Thoracic aorta is normal in caliber. No thoracic aortic dissection or aneurysm. Mediastinum/Nodes: Mild mediastinal lymphadenopathy is unchanged compared  with multiple prior examinations. Left-sided Port-A-Cath with the tip at the cavoatrial junction. Normal esophagus. Normal thyroid. Lungs/Pleura: No pleural effusion or pneumothorax. Bilateral patchy areas of ground-glass opacity in the upper and lower lobes both central and peripheral distribution similar in appearance to the prior exam. No focal consolidation, pleural effusion or pneumothorax. Upper Abdomen: Involuted and calcified spleen consistent with history of sickle cell disease. No acute upper abdominal abnormality. Musculoskeletal: No acute osseous abnormality. 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 Review of the MIP images confirms the above findings. IMPRESSION: 1. No acute pulmonary embolus. Chronic bilateral lower lobe subsegmental pulmonary emboli unchanged in the prior exam. Chronic right heart strain with dilatation of the main pulmonary artery as can be seen with pulmonary arterial hypertension. 2. Stable cardiomegaly. 3. Patchy ground-glass opacities bilaterally suggesting mild pulmonary edema versus an infectious or inflammatory etiology. 4. Stable mediastinal lymphadenopathy. Electronically Signed   By: Kathreen Devoid   On: 02/21/2016 10:46    Scheduled Meds: . ambrisentan  5 mg Oral Daily  . aspirin  81 mg Oral Daily  . cholecalciferol  2,000 Units Oral Daily  . Deferasirox  1,080 mg Oral Daily  . folic acid  1 mg Oral Daily  . gabapentin  300 mg Oral TID  . HYDROmorphone   Intravenous Q4H  . ketorolac  30 mg Intravenous Q6H  . morphine  30 mg Oral Q12H  . potassium chloride SA  40 mEq Oral Daily  . rivaroxaban  20 mg Oral QAC supper  . senna-docusate  1 tablet Oral BID   Continuous Infusions: . dextrose 5 % and 0.45% NaCl 10 mL/hr at 03/14/16 1613    Principal Problem:   Chest pain syndrome Active Problems:   Pulmonary HTN   Chronic anticoagulation   Anemia of chronic disease   Sickle cell anemia (HCC)   Troponin level  elevated   Chest pain    In excess of 25 minutes spent during this visit. Greater than 50% involved face to face contact with the patient for assessment, counseling and coordination of care.

## 2016-03-16 ENCOUNTER — Inpatient Hospital Stay (HOSPITAL_COMMUNITY): Payer: Medicare Other

## 2016-03-16 ENCOUNTER — Other Ambulatory Visit: Payer: Self-pay | Admitting: Cardiology

## 2016-03-16 LAB — TROPONIN I
Troponin I: 0.2 ng/mL (ref <0.03)
Troponin I: 0.17 ng/mL (ref <0.03)
Troponin I: 0.15 ng/mL (ref <0.03)

## 2016-03-16 LAB — RETICULOCYTES
RBC.: 2.19 MIL/uL — ABNORMAL LOW (ref 4.22–5.81)
RETIC CT PCT: 22 % — AB (ref 0.4–3.1)
Retic Count, Absolute: 481.8 10*3/uL — ABNORMAL HIGH (ref 19.0–186.0)

## 2016-03-16 LAB — CBC WITH DIFFERENTIAL/PLATELET
Basophils Absolute: 0.1 10*3/uL (ref 0.0–0.1)
Basophils Relative: 1 %
Eosinophils Absolute: 0.7 10*3/uL (ref 0.0–0.7)
Eosinophils Relative: 6 %
HCT: 20.1 % — ABNORMAL LOW (ref 39.0–52.0)
Hemoglobin: 6.9 g/dL — CL (ref 13.0–17.0)
LYMPHS PCT: 16 %
Lymphs Abs: 2 10*3/uL (ref 0.7–4.0)
MCH: 31.5 pg (ref 26.0–34.0)
MCHC: 34.3 g/dL (ref 30.0–36.0)
MCV: 91.8 fL (ref 78.0–100.0)
Monocytes Absolute: 2.3 10*3/uL — ABNORMAL HIGH (ref 0.1–1.0)
Monocytes Relative: 19 %
NEUTROS ABS: 7.1 10*3/uL (ref 1.7–7.7)
Neutrophils Relative %: 58 %
PLATELETS: 483 10*3/uL — AB (ref 150–400)
RBC: 2.19 MIL/uL — ABNORMAL LOW (ref 4.22–5.81)
RDW: 21.9 % — ABNORMAL HIGH (ref 11.5–15.5)
WBC: 12.2 10*3/uL — AB (ref 4.0–10.5)

## 2016-03-16 LAB — PREPARE RBC (CROSSMATCH)

## 2016-03-16 LAB — BASIC METABOLIC PANEL
Anion gap: 4 — ABNORMAL LOW (ref 5–15)
BUN: 14 mg/dL (ref 6–20)
CHLORIDE: 112 mmol/L — AB (ref 101–111)
CO2: 23 mmol/L (ref 22–32)
CREATININE: 0.88 mg/dL (ref 0.61–1.24)
Calcium: 8.8 mg/dL — ABNORMAL LOW (ref 8.9–10.3)
GFR calc Af Amer: >60 mL/min (ref >60)
GFR calc non Af Amer: >60 mL/min (ref >60)
Glucose, Bld: 117 mg/dL — ABNORMAL HIGH (ref 65–99)
Potassium: 4.9 mmol/L (ref 3.5–5.1)
SODIUM: 139 mmol/L (ref 135–145)

## 2016-03-16 MED ORDER — FUROSEMIDE 10 MG/ML IJ SOLN
40.0000 mg | Freq: Once | INTRAMUSCULAR | Status: AC
  Administered 2016-03-16: 40 mg via INTRAVENOUS
  Filled 2016-03-16: qty 4

## 2016-03-16 MED ORDER — PROCHLORPERAZINE MALEATE 10 MG PO TABS: 10.0000 mg | ORAL_TABLET | Freq: Four times a day (QID) | ORAL | Status: DC | PRN

## 2016-03-16 MED ORDER — CARVEDILOL 3.125 MG PO TABS
3.1250 mg | ORAL_TABLET | Freq: Two times a day (BID) | ORAL | Status: DC
  Administered 2016-03-16 – 2016-03-19 (×7): 3.125 mg via ORAL
  Filled 2016-03-16 (×7): qty 1

## 2016-03-16 MED ORDER — HYDROMORPHONE 1 MG/ML IV SOLN
INTRAVENOUS | Status: DC
  Administered 2016-03-16: 5.8 mg via INTRAVENOUS
  Administered 2016-03-16: 2.8 mg via INTRAVENOUS
  Administered 2016-03-16: 15 mg via INTRAVENOUS
  Administered 2016-03-16: 23:00:00 via INTRAVENOUS
  Administered 2016-03-17: 1.8 mg via INTRAVENOUS
  Administered 2016-03-17: 10 mg via INTRAVENOUS
  Administered 2016-03-17: 1.2 mg via INTRAVENOUS
  Administered 2016-03-17: 10 mg via INTRAVENOUS
  Filled 2016-03-16: qty 25

## 2016-03-16 MED ORDER — SODIUM CHLORIDE 0.9 % IV SOLN: Freq: Once | INTRAVENOUS | Status: DC

## 2016-03-16 MED ORDER — HYDROMORPHONE HCL 4 MG PO TABS
4.0000 mg | ORAL_TABLET | ORAL | Status: DC
Start: 2016-03-16 — End: 2016-03-19
  Administered 2016-03-16 – 2016-03-19 (×16): 4 mg via ORAL
  Filled 2016-03-16 (×18): qty 1

## 2016-03-16 NOTE — Progress Notes (Signed)
Patient has decreased oxygen saturation 87% 6lt. Patient is easy to arouse, saturation has now increased 89-90%. Will continue to monitor.

## 2016-03-16 NOTE — Progress Notes (Signed)
Dr. Zigmund Daniel aware of critical HGB of 6.9.  No new orders at this time.  Patient is resting comfortably in bed at this time, will continue to monitor patient.

## 2016-03-16 NOTE — Progress Notes (Signed)
Patient 02 is 93 % on 6 L [place].

## 2016-03-16 NOTE — Progress Notes (Signed)
CRITICAL VALUE ALERT  Critical value received:  HGB 6.9  Date of notification:  03/16/16  Time of notification:  [id number]  Critical value read back:Yes.    Nurse who received alert:  Ellen Henri RN  MD notified (1st page):  MD Oncall  Time of first page:  [phone]  MD notified (2nd page):  Time of second page:  Responding MD:  None  Time MD responded:  None

## 2016-03-16 NOTE — Consult Note (Addendum)
The patient has been seen in conjunction with Gerald Bellis, NP-C. All aspects of care have been considered and discussed. The patient has been personally interviewed, examined, and all clinical data has been reviewed.    Clinical data suggested the elevation in troponin is likely related to demand ischemia. We do not feel this presentation represents acute coronary syndrome.  His most important cardiovascular condition at this time is severe pulmonary hypertension. He has apparently seen the heart team in the past and was referred to pulmonary (by Dr. Johnsie [name]) for management of pulmonary hypertension. Follow-up has not been inconsistent. I believe his care has transitioned to [facility].  We do not believe further cardiac evaluation is necessary at this time. Discussed outpatient follow-up at [facility] for ongoing management of pulmonary hypertension.    Cardiology Consult    Patient ID: [name] MRN: [id number], DOB/AGE: Oct 21, 1980   Admit date: 03/13/2016 Date of Consult: 03/16/2016  Primary Physician: Gerald Chessman, MD Primary Cardiologist: Dr. Haroldine [name] Requesting Provider: Dr. Zigmund [name] Reason for Consultation: Elevated Trop  Patient Profile    36 y.o. 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   Past Medical History   Past Medical History:  Diagnosis Date  . Acute chest syndrome ([place]) 06/18/2013  . Acute embolism and thrombosis of right internal jugular vein ([place])   . Alcohol consumption of one to four drinks per day   . Avascular necrosis (HCC)    Right Hip  . Blood transfusion   . Chronic anticoagulation   . Demand ischemia ([place]) 01/02/2014  . Former smoker   . Functional asplenia   . Hb-SS disease with crisis ([place])    . History of Clostridium difficile infection   . History of pulmonary embolus (PE)   . Hypertension   . Hypokalemia   . Leukocytosis    Chronic  . Mood disorder ([place])   . Noncompliance with medication regimen   . Oxygen deficiency   . Pulmonary hypertension   . Second hand tobacco smoke exposure   . Sickle cell anemia (HCC)   . Sickle-cell crisis with associated acute chest syndrome ([place]) 05/13/2013  . Stroke ([place])   . Thrombocytosis (HCC)    Chronic  . Uses marijuana     Past Surgical History:  Procedure Laterality Date  . CHOLECYSTECTOMY     01/2008  . Excision of left periauricular cyst     10/2009  . Excision of right ear lobe cyst with primary closur     11/2007  . Porta cath placement    . Porta cath removal    . PORTACATH PLACEMENT  01/05/2012   Procedure: INSERTION PORT-A-CATH;  Surgeon: Gerald Hollingshead, MD;  Location: [facility];  Service: General;  Laterality: N/A;  ultrasound guiced port a cath insertion with fluoroscopy  . Right hip replacement     08/2006  . UMBILICAL HERNIA REPAIR     01/2008     Allergies  No Known Allergies  History of Present Illness    [name] is a 36 yo male with PMH of sickle cell disease, hx of PE on Xarelto, pulmonary HTN, chronic pain, PAH and avascular necrosis. He has been seen by Cardiology briefly in the past, and lastly by CHF service.  He was admitted 8/16 with pain and hypoxia 2/2 his sickle cell crisis. Trop peaked that admission at 7.76. EKG showed RBB with RVH, and there was concern for microvascular disease 2/2 to SCD. Felt he was not a cath candidate at the time, and suggestion was made for medical management. CHF was consulted in regards to optimize medical management for both right and left ventricular damage in relation to [facility]. It was planned that he follow in the HF clinic as an outpatient, but he was lost to follow up.   Echo 3/17 showed EF 55-60% with moderately dilated RV, RA, moderate-severe TR with PA pressure 78.    Reports he is currently O2 dependent (2-3L [place]), and has limited physical activity as a result. Denies any exertional symptoms, occasional dyspnea. States he has been complaint with his home medications, and his mother ensures he takes them. Has been in his usual state of health until the 8th. He presented to the ED with reports of left sided chest pain with associated dyspnea. States this was different from his normal sickle cell pain, which is usually located in his lower back. Reports he was out for the day prior to symptoms, and went the whole day without his supplemental O2, then developed symptoms. CTA was negative for PE. Baseline Hgb around 7.5, WBC 12.2, Trop 0.30>>0.17>>0.20>>0.17. EKG shows SR 1st degree AVB, RVH with T wave changes in anterolateral leads all similar to previous tracings.   Inpatient Medications    . sodium chloride   Intravenous Once  . ambrisentan  5 mg Oral Daily  . aspirin  81 mg Oral Daily  . carvedilol  3.125 mg Oral BID WC  . cholecalciferol  2,000 Units Oral Daily  . Deferasirox  1,080 mg Oral Daily  . folic acid  1 mg Oral Daily  . gabapentin  300 mg Oral TID  . HYDROmorphone   Intravenous Q4H  . ketorolac  30 mg Intravenous Q6H  . morphine  30 mg Oral Q12H  . potassium chloride SA  40 mEq Oral Daily  . rivaroxaban  20 mg Oral QAC supper  . senna-docusate  1 tablet Oral BID    Family History    Family History  Problem Relation Age of Onset  . Sickle cell trait Mother   . Depression Mother   . Diabetes Mother   . Sickle cell trait Father   . Sickle cell trait Brother     Social History    Social History   Social History  . Marital status: Single    Spouse name: N/A  . Number of children: 0  . Years of education: 31   Occupational History  . 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 Smoking status: Former Smoker    Packs/day: 0.50    Years: 10.00    Types: Cigarettes     Quit date: 05/29/2011  . Smokeless tobacco: Never Used  . Alcohol use No  . Drug use: No     Comment: Marijuana weekly  . Sexual activity: Yes    Partners: Female     Comment: month ago   Other Topics Concern  . Not on file   Social History Narrative   Lives in an apartment.  Single.  Lives alone but has a girlfriend that helps care for him.  Does not use any assist devices.        [name]:  [phone] Mom, emergency contact      [facility]:   Patient continuing to live in her apartment in town alone. 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     Review of Systems    General:  No chills, fever, night sweats or weight changes.  Cardiovascular: See HPI Dermatological: No rash, lesions/masses Respiratory: No cough, dyspnea Urologic: No hematuria, dysuria Abdominal:   No nausea, vomiting, diarrhea, bright red blood per rectum, melena, or hematemesis Neurologic:  No visual changes, wkns, changes in mental status. All other systems reviewed and are otherwise negative except as noted above.  Physical Exam    Blood pressure 110/64, pulse 93, temperature 98.8 F (37.1 C), temperature source Oral, resp. rate 18, height 6' (1.829 m), weight 180 lb 1.9 oz (81.7 kg), SpO2 90 %.  General: Pleasant young AAM, NAD, on [place] Psych: Normal affect, but lethargic on PCA pump. Neuro: Alert and oriented X 3. Moves all extremities spontaneously. HEENT: Normal  Neck: Supple without bruits or JVD. Lungs:  Resp regular and unlabored, CTA. Heart: RRR no s3, s4, 4/6 murmurs. Abdomen: Soft, non-tender, non-distended, BS + x 4.  Extremities: No clubbing, cyanosis or edema. DP/PT/Radials 2+ and equal bilaterally.  Labs    Troponin (Point of Care Test) No results for input(s): TROPIPOC in the last 72 hours.  Recent Labs  03/15/16 1431 03/15/16 2003 03/16/16 0159 03/16/16 0802  TROPONINI 0.17* 0.20* 0.20* 0.17*   Lab Results  Component Value Date   WBC 12.2 (H) 03/16/2016   HGB  6.9 (LL) 03/16/2016   HCT 20.1 (L) 03/16/2016   MCV 91.8 03/16/2016   PLT 483 (H) 03/16/2016    Recent Labs Lab 03/14/16 0222  03/16/16 0547  NA 137  < > 139  K 3.9  < > 4.9  CL 106  < > 112*  CO2 23  < > 23  BUN 13  < > 14  CREATININE 0.97  < > 0.88  CALCIUM 8.5*  < > 8.8*  PROT 7.7  --   --   BILITOT 3.2*  --   --   ALKPHOS 92  --   --   ALT 23  --   --   AST 43*  --   --   GLUCOSE 139*  < > 117*  < > = values in this interval not displayed. Lab Results  Component Value Date   CHOL 87 11/10/2015   HDL 20 (L) 11/10/2015   LDLCALC 46 11/10/2015   TRIG 103 11/10/2015   Lab Results  Component Value Date   DDIMER 0.27 10/27/2015     Radiology Studies    Dg Chest 2 View  Result Date: 03/11/2016 CLINICAL DATA:  Sickle cell pain crisis with bilateral chest pain beginning this morning. EXAM: CHEST  2 VIEW COMPARISON:  03/06/2016 FINDINGS: Moderate cardiomegaly remains stable. Left-sided power port remains in appropriate position. Mild scarring and peripheral right midlung is unchanged. No evidence of acute pulmonary infiltrate or edema. No evidence of pleural effusion. IMPRESSION: Stable cardiomegaly.  No active lung disease. Electronically Signed   By: Earle Gell M.D.   On: 03/11/2016 08:55   Ct Angio Chest Pe W And/or Wo Contrast  Result Date: 03/13/2016 CLINICAL DATA:  History of sickle cell. Left-sided chest pain starting this morning about 3 a.m. EXAM: CT ANGIOGRAPHY CHEST WITH CONTRAST TECHNIQUE: Multidetector CT imaging of the chest was performed using the standard protocol during bolus administration of intravenous contrast. Multiplanar CT image reconstructions  and MIPs were obtained to evaluate the vascular anatomy. CONTRAST:  100 mL Isovue 370 COMPARISON:  CT chest 03/06/2016, 02/21/2016, 01/05/2016 FINDINGS: Cardiovascular: Satisfactory opacification of the pulmonary arteries to the segmental level. No evidence of pulmonary embolism. Stable cardiomegaly. No pericardial  effusion. Normal caliber thoracic aorta. No thoracic aortic atherosclerosis or dissection. Left-sided Port-A-Cath again noted. Mediastinum/Nodes: Thyroid gland, trachea, and esophagus demonstrate no significant findings. Multiple small prevascular lymph nodes unchanged compared with the prior exams. Mildly enlarged stable subcarinal lymph node. No axillary lymphadenopathy. Lungs/Pleura: Patchy areas of ground-glass opacities in bilateral upper lobes and lower lobes which may reflect mild pulmonary edema versus alveolitis secondary to an infectious or inflammatory etiology. No pleural effusion or pneumothorax. Upper Abdomen: No acute abnormality in the upper abdomen. Chronic calcified, involuted spleen. Musculoskeletal: No acute osseous abnormality. No lytic or sclerotic osseous lesion. Soft tissue: Bilateral retroareolar fibroglandular soft tissue as can be seen with gynecomastia. Review of the MIP images confirms the above findings. IMPRESSION: 1. No evidence of acute pulmonary embolus. 2. Stable cardiomegaly. 3. Patchy areas of ground-glass opacities in bilateral upper lobes and lower lobes which may reflect mild pulmonary edema versus alveolitis secondary to an infectious or inflammatory etiology. Electronically Signed   By: Kathreen Devoid   On: 03/13/2016 10:13    ECG & Cardiac Imaging    EKG: SR 1st degree AVB, RVH with T wave changes in anterolateral leads all similar to previous tracings.    Echo: 3/17  Study Conclusions  - Left ventricle: Septal flattening from significant pulmonary   hypertension The cavity size was normal. Wall thickness was   normal. Systolic function was normal. The estimated ejection   fraction was in the range of 55% to 60%. - Right ventricle: The cavity size was moderately dilated. - Right atrium: The atrium was moderately dilated. - Atrial septum: No defect or patent foramen ovale was identified. - Tricuspid valve: There was moderate-severe regurgitation. -  Pulmonary arteries: PA peak pressure: 78 mm Hg (S). - Impressions: Significant pulmonary hypertension likely from   sickle cell disease Suggest pulmonary consult   PA pressure similar to study done 08/2014.  Impressions:  - Significant pulmonary hypertension likely from sickle cell   disease Suggest pulmonary consult   PA pressure similar to study done 08/2014.  Assessment & Plan    36 y.o. male w/ PMH significant for sickle cell disease, hx of PE on Xarelto, pulmonary HTN, chronic pain, PAH and avascular necrosis who presented with SS crisis after being out of his supplemental O2, and found to have an elevated trop.  1. Elevated Trop: Trops have been cycled this admission and show a flat non ACS trend. Pain is somewhat atypical in nature and slight reproducible to palpation. Has had improvement with O2, and PCA administration. Ekg is nonacute from previous tracings.  -- Will check echo for LV function and WMA.   2. PAH: Previously seen by Advanced Heart Failure team for optimization of PAH medications. Reports he has been compliant with home medications.  -- continue letairis, BB therapy, may need the addition of lasix ( CT suggestive of edema)  3. Sickle Cell Crisis: 2/2 going without his supplemental O2? Management per primary.  -- Hgb 6.9 today. Plan for transfusion.  4. Hx of PE: CTA negative, on Xarelto   Barnet Pall, NP-C Pager [phone] 03/16/2016, 10:23 AM

## 2016-03-16 NOTE — Progress Notes (Addendum)
SICKLE CELL SERVICE PROGRESS NOTE  [name] [id number] DOB: 09/30/1980 DOA: 03/13/2016 PCP: [name], MD  Assessment/Plan: Principal Problem:   Chest pain syndrome Active Problems:   Pulmonary HTN   Chronic anticoagulation   Anemia of chronic disease   Sickle cell anemia (HCC)   Troponin level elevated   Chest pain  1. Hb SS with crisis: Continue PCA dose to 0.6 mg and continue Toradol. Decrease clinician assisted doses and schedule oral Dilaudid. 2. Acute on Chronic Hypoxic Respiratory Failure due to Secondary PAH and Recurrent PE: ABG pAO2 of 71 on 5 l/min of Oxygen. Once transfused, will check ABG on 4 L/min and goal is paO2 > 60.  Unfortunately I am sure that his pulmonary HTN is worsening as a result of prolonged Hypoxia and patient is still non-adherent to wearing Oxygen as prescribed. 3. Hypoxia: Pt has no evidence of pneumonia. It is my opinion that the findings on the CT scan are related to hypoxia and maybe constriction of vessels as the changes appear to occur in the periphery. Additionally he has no other clinical signs of a bacterial infection so will discontinue antibiotics and observe.  4. Prolonged QT Interval: Zofran and Azithromycin discontinued. elactrolytes normal. 5. Elevated Troponin: Suspect demand ischemia. Troponin trending down. Awaiting Cardiology input. 6. Leukocytosis: Likely related to crisis.  7. Anemia of chronic disease:  Hb at baseline. However given continued elevation of Troponin will transfuse 1 unit RBC to improve Oxygen carryinng capacity 8. Chronic pain: Continue MS Contin 9. Chronic Anticoagulation: Pt on Xarelto for recurrent PE's. Continue Xarelto.   Code Status: Full Code Family Communication: N/A Disposition Plan: Not yet ready for discharge  [place].  Pager [phone]. If 7PM-7AM, please contact night-coverage.  03/16/2016, 2:59 PM  LOS: 3 days   Interim History: Pt reports pain at 6/10 and localized to  ribs. He had a BM last night. His use on the PCA has decreased in the last 24 hours..   Consultants:  None  Procedures:  None  Antibiotics:  Azithromycin 2/11 >>2/12  Rocephin 2/11 >>2/12  Objective: Vitals:   03/16/16 0809 03/16/16 0924 03/16/16 1149 03/16/16 1430  BP:  110/64  115/60  Pulse:  93  90  Resp: 15 18 16 18   Temp:  98.8 F (37.1 C)  98.8 F (37.1 C)  TempSrc:  Oral  Oral  SpO2: 92% 90% 93% 95%  Weight:      Height:       Weight change:   Intake/Output Summary (Last 24 hours) at 03/16/16 1459 Last data filed at 03/16/16 1027  Gross per 24 hour  Intake              444 ml  Output             1925 ml  Net            -1481 ml    General: Alert, awake, oriented x3, in no acute distress.  HEENT: [place]/AT PEERL, EOMI, anicteric Neck: Trachea midline,  no masses, no thyromegal,y no JVD, no carotid bruit OROPHARYNX:  Moist, No exudate/ erythema/lesions.  Heart: Regular rate and rhythm, II/VI systolic ejection murmurs at base, no rubs or gallops, PMI non-displaced, no heaves or thrills on palpation.  Lungs: Clear to auscultation, no wheezing or rhonchi noted. No increased vocal fremitus resonant to percussion  Abdomen: Soft, nontender, nondistended, positive bowel sounds, no masses no hepatosplenomegaly noted.  Neuro: No focal neurological deficits noted cranial nerves II through XII  grossly intact. . Strength at functional baseline in bilateral upper and lower extremities. Musculoskeletal: No warmth swelling or erythema around joints, no spinal tenderness noted. Psychiatric: Patient alert and oriented x3, good insight and cognition, good recent to remote recall.    Data Reviewed: Basic Metabolic Panel:  Recent Labs Lab 03/11/16 0830 03/13/16 0820 03/14/16 0222 03/14/16 1348 03/15/16 0531 03/16/16 0547  NA 137 138 137  --  136 139  K 3.8 3.8 3.9  --  4.1 4.9  CL 107 109 106  --  108 112*  CO2 23 22 23   --  22 23  GLUCOSE 95 127* 139*  --  190* 117*   BUN 8 8 13   --  12 14  CREATININE 0.88 0.86 0.97  --  0.90 0.88  CALCIUM 9.3 8.9 8.5*  --  8.2* 8.8*  MG  --   --   --  1.8  --   --    Liver Function Tests:  Recent Labs Lab 03/11/16 0830 03/13/16 0820 03/14/16 0222  AST 41 39 43*  ALT 30 24 23   ALKPHOS 85 90 92  BILITOT 4.1* 5.0* 3.2*  PROT 7.5 7.7 7.7  ALBUMIN 4.2 4.3 4.1   No results for input(s): LIPASE, AMYLASE in the last 168 hours. No results for input(s): AMMONIA in the last 168 hours. CBC:  Recent Labs Lab 03/11/16 0830 03/13/16 0820 03/14/16 0222 03/15/16 1123 03/16/16 0547  WBC 14.3* 15.5* 17.9* 11.7* 12.2*  NEUTROABS 11.1* 9.1* 11.1* 8.2* 7.1  HGB 7.3* 7.4* 7.3* 7.1* 6.9*  HCT 21.0* 21.6* 21.5* 21.0* 20.1*  MCV 90.5 90.4 91.5 92.1 91.8  PLT 528* 514* 497* 495* 483*   Cardiac Enzymes:  Recent Labs Lab 03/15/16 0816 03/15/16 1431 03/15/16 2003 03/16/16 0159 03/16/16 0802  TROPONINI 0.18* 0.17* 0.20* 0.20* 0.17*   BNP (last 3 results)  Recent Labs  11/28/15 0520 02/11/16 1006 03/07/16 0535  BNP 422.5* 381.0* 319.6*    ProBNP (last 3 results) No results for input(s): PROBNP in the last 8760 hours.  CBG: No results for input(s): GLUCAP in the last 168 hours.  Recent Results (from the past 240 hour(s))  Culture, blood (routine x 2)     Status: None (Preliminary result)   Collection Time: 03/13/16 10:40 AM  Result Value Ref Range Status   Specimen Description BLOOD RIGHT HAND  Final   Special Requests BOTTLES DRAWN AEROBIC ONLY [place]  Final   Culture   Final    NO GROWTH 2 DAYS Performed at [facility], [address], [place], [place] [address]    Report Status PENDING  Incomplete  Culture, blood (routine x 2)     Status: None (Preliminary result)   Collection Time: 03/13/16 10:50 AM  Result Value Ref Range Status   Specimen Description BLOOD PORTA CATH  Final   Special Requests BOTTLES DRAWN AEROBIC AND ANAEROBIC 5 CC EACH  Final   Culture   Final    NO GROWTH 2  DAYS Performed at [facility], [place] [address] [place],  [address]    Report Status PENDING  Incomplete     Studies: Dg Chest 2 View  Result Date: 03/11/2016 CLINICAL DATA:  Sickle cell pain crisis with bilateral chest pain beginning this morning. EXAM: CHEST  2 VIEW COMPARISON:  03/06/2016 FINDINGS: Moderate cardiomegaly remains stable. Left-sided power port remains in appropriate position. Mild scarring and peripheral right midlung is unchanged. No evidence of acute pulmonary infiltrate or edema.  No evidence of pleural effusion. IMPRESSION: Stable cardiomegaly.  No active lung disease. Electronically Signed   By: Earle Gell M.D.   On: 03/11/2016 08:55   Dg Chest 2 View  Result Date: 03/06/2016 CLINICAL DATA:  36 year old male with right rib pain and sickle cell crisis. Initial encounter. EXAM: CHEST  2 VIEW COMPARISON:  02/29/2016 and earlier. FINDINGS: Stable cardiomegaly and mediastinal contours. Stable left chest power port, currently accessed. Interval resolved patchy and nodular right upper lung opacity. No pneumothorax, pulmonary edema, pleural effusion or acute pulmonary opacity. Stable basilar predominant mild chronic increased interstitial markings. Negative visible bowel gas pattern. Stable cholecystectomy clips. No acute osseous abnormality identified. IMPRESSION: Interval resolved right upper lobe opacity. Stable cardiomegaly with no acute cardiopulmonary abnormality. Electronically Signed   By: Genevie Ann M.D.   On: 03/06/2016 10:46   Dg Chest 2 View  Result Date: 02/29/2016 CLINICAL DATA:  Hypoxia and weakness.  Sickle cell. EXAM: CHEST  2 VIEW COMPARISON:  02/23/2016. FINDINGS: Trachea is midline. Left subclavian power port tip is in the high right atrium. Heart is enlarged, stable. Mild patchy right upper lobe airspace opacification. Streaky atelectasis or scarring in the left lower lobe. No pleural fluid. IMPRESSION: Mild right upper lobe patchy airspace opacification.  Electronically Signed   By: Lorin Picket M.D.   On: 02/29/2016 12:53   Dg Chest 2 View  Result Date: 02/23/2016 CLINICAL DATA:  SICKLE CELL CRISIS EXAM: CHEST  2 VIEW COMPARISON:  CT 02/21/2016 FINDINGS: LEFT-SIDED POWER PORT UNCHANGED. STABLE CARDIAC SILHOUETTE. THERE IS MILD CENTRAL VENOUS CONGESTION. NO FOCAL INFILTRATE. NO PLEURAL FLUID. NO PNEUMOTHORAX. IMPRESSION: 1. STABLE CARDIOMEGALY. 2. STABLE CENTRAL VENOUS PULMONARY CONGESTION. 3. NO EVIDENCE OF PULMONARY INFILTRATE Electronically Signed   By: Suzy Bouchard M.D.   On: 02/23/2016 16:35   Dg Chest 2 View  Result Date: 02/21/2016 CLINICAL DATA:  New onset chest pain beginning today. EXAM: CHEST  2 VIEW COMPARISON:  02/19/2016 FINDINGS: Chronic enlargement of the cardiac silhouette. Power port tip in the SVC just above the right atrium as seen previously. Pulmonary venous hypertension, possibly with early interstitial edema. No advanced edema. No consolidation or collapse. No effusions. IMPRESSION: Chronic cardiomegaly. Worsening of the pulmonary vascular pattern suggesting venous hypertension with early interstitial edema. Electronically Signed   By: Nelson Chimes M.D.   On: 02/21/2016 07:49   Dg Chest 2 View  Result Date: 02/19/2016 CLINICAL DATA:  Sickle cell pain crisis, worsening bilat lower chest pain since yesterday EXAM: CHEST  2 VIEW COMPARISON:  02/11/2016 FINDINGS: Left Port-A-Cath is unchanged in position. Midline trachea. Moderate cardiomegaly. Mediastinal contours otherwise within normal limits. No pleural effusion or pneumothorax. No congestive failure. Lower lobe predominant pulmonary interstitial thickening is chronic and nonspecific. No lobar consolidation. IMPRESSION: Cardiomegaly with chronic interstitial thickening. No acute findings. Electronically Signed   By: Abigail Miyamoto M.D.   On: 02/19/2016 07:18   Ct Angio Chest Pe W And/or Wo Contrast  Result Date: 03/13/2016 CLINICAL DATA:  History of sickle cell. Left-sided  chest pain starting this morning about 3 a.m. EXAM: CT ANGIOGRAPHY CHEST WITH CONTRAST TECHNIQUE: Multidetector CT imaging of the chest was performed using the standard protocol during bolus administration of intravenous contrast. Multiplanar CT image reconstructions and MIPs were obtained to evaluate the vascular anatomy. CONTRAST:  100 mL Isovue 370 COMPARISON:  CT chest 03/06/2016, 02/21/2016, 01/05/2016 FINDINGS: Cardiovascular: Satisfactory opacification of the pulmonary arteries to the segmental level. No evidence of pulmonary embolism. Stable cardiomegaly. No pericardial effusion.  Normal caliber thoracic aorta. No thoracic aortic atherosclerosis or dissection. Left-sided Port-A-Cath again noted. Mediastinum/Nodes: Thyroid gland, trachea, and esophagus demonstrate no significant findings. Multiple small prevascular lymph nodes unchanged compared with the prior exams. Mildly enlarged stable subcarinal lymph node. No axillary lymphadenopathy. Lungs/Pleura: Patchy areas of ground-glass opacities in bilateral upper lobes and lower lobes which may reflect mild pulmonary edema versus alveolitis secondary to an infectious or inflammatory etiology. No pleural effusion or pneumothorax. Upper Abdomen: No acute abnormality in the upper abdomen. Chronic calcified, involuted spleen. Musculoskeletal: No acute osseous abnormality. No lytic or sclerotic osseous lesion. Soft tissue: Bilateral retroareolar fibroglandular soft tissue as can be seen with gynecomastia. Review of the MIP images confirms the above findings. IMPRESSION: 1. No evidence of acute pulmonary embolus. 2. Stable cardiomegaly. 3. Patchy areas of ground-glass opacities in bilateral upper lobes and lower lobes which may reflect mild pulmonary edema versus alveolitis secondary to an infectious or inflammatory etiology. Electronically Signed   By: Kathreen Devoid   On: 03/13/2016 10:13   Ct Angio Chest Pe W And/or Wo Contrast  Result Date: 03/06/2016 CLINICAL  DATA:  Chest pain, new hypoxia.  Sickle cell disease. EXAM: CT ANGIOGRAPHY CHEST WITH CONTRAST TECHNIQUE: Multidetector CT imaging of the chest was performed using the standard protocol during bolus administration of intravenous contrast. Multiplanar CT image reconstructions and MIPs were obtained to evaluate the vascular anatomy. CONTRAST:  100 cc Isovue 370 Additional 80 cc Isovue 370 given due to port leakage and patient motion. COMPARISON:  Chest CT angiogram dated 02/21/2016. FINDINGS: Cardiovascular: Despite repeat imaging, the peripheral segmental and subsegmental pulmonary arteries to the lower lobes are difficult to definitively characterize due to patient motion artifact and suboptimal contrast opacification. There is, however, no pulmonary embolism identified within the main, lobar or central segmental pulmonary arteries bilaterally. Patient has had chronic lower lobe subsegmental pulmonary emboli described on previous exams, not able to be delineated again on today's study due to the patient motion artifact and suboptimal contrast opacification. Thoracic aorta is normal in caliber and configuration. Heart is prominently enlarged, stable compared to the previous exam. No pericardial effusion seen. Mediastinum/Nodes: Numerous small and moderate-sized lymph nodes within the anterior mediastinum are stable. Normal residual thymic tissue is also noted within the anterior mediastinum. Esophagus appears normal. Trachea and central bronchi are unremarkable. Lungs/Pleura: Mild bibasilar scarring/atelectasis is stable. Lungs otherwise clear. No evidence of pneumonia. No pleural effusion or pneumothorax. Upper Abdomen: Limited images of the upper abdomen are unremarkable. Musculoskeletal: No acute or suspicious osseous lesion. Superficial soft tissues are unremarkable. Review of the MIP images confirms the above findings. IMPRESSION: 1. No acute pulmonary embolus identified, with mild study limitations detailed  above. 2. Stable cardiomegaly. 3. No pneumonia or pulmonary edema. Stable mild scarring/fibrosis at the lung bases. 4. Stable mediastinal lymphadenopathy. Electronically Signed   By: Franki Cabot M.D.   On: 03/06/2016 13:30   Ct Angio Chest Pe W Or Wo Contrast  Result Date: 02/21/2016 CLINICAL DATA:  Sickle cell disease.  Chest pain, bilateral rib pain EXAM: CT ANGIOGRAPHY CHEST WITH CONTRAST TECHNIQUE: Multidetector CT imaging of the chest was performed using the standard protocol during bolus administration of intravenous contrast. Multiplanar CT image reconstructions and MIPs were obtained to evaluate the vascular anatomy. CONTRAST:  100 mL Isovue 370 COMPARISON:  01/05/2016, 11/10/2015 FINDINGS: Cardiovascular: Satisfactory opacification of the central and lobar pulmonary arteries with somewhat suboptimal opacification of the subsegmental pulmonary arteries. Chronic bilateral lower lobe subsegmental occlusive pulmonary emboli. No acute  pulmonary embolus. Main pulmonary artery is enlarged measuring 4.2 cm in diameter. Right heart strain (RV/ LV equals 1.0). Severe cardiomegaly. Trace pericardial effusion. Thoracic aorta is normal in caliber. No thoracic aortic dissection or aneurysm. Mediastinum/Nodes: Mild mediastinal lymphadenopathy is unchanged compared with multiple prior examinations. Left-sided Port-A-Cath with the tip at the cavoatrial junction. Normal esophagus. Normal thyroid. Lungs/Pleura: No pleural effusion or pneumothorax. Bilateral patchy areas of ground-glass opacity in the upper and lower lobes both central and peripheral distribution similar in appearance to the prior exam. No focal consolidation, pleural effusion or pneumothorax. Upper Abdomen: Involuted and calcified spleen consistent with history of sickle cell disease. No acute upper abdominal abnormality. Musculoskeletal: No acute osseous abnormality. 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 Review of the MIP images confirms the above findings. IMPRESSION: 1. No acute pulmonary embolus. Chronic bilateral lower lobe subsegmental pulmonary emboli unchanged in the prior exam. Chronic right heart strain with dilatation of the main pulmonary artery as can be seen with pulmonary arterial hypertension. 2. Stable cardiomegaly. 3. Patchy ground-glass opacities bilaterally suggesting mild pulmonary edema versus an infectious or inflammatory etiology. 4. Stable mediastinal lymphadenopathy. Electronically Signed   By: Kathreen Devoid   On: 02/21/2016 10:46    Scheduled Meds: . sodium chloride   Intravenous Once  . ambrisentan  5 mg Oral Daily  . aspirin  81 mg Oral Daily  . carvedilol  3.125 mg Oral BID WC  . cholecalciferol  2,000 Units Oral Daily  . Deferasirox  1,080 mg Oral Daily  . folic acid  1 mg Oral Daily  . gabapentin  300 mg Oral TID  . HYDROmorphone   Intravenous Q4H  . HYDROmorphone  4 mg Oral Q4H  . ketorolac  30 mg Intravenous Q6H  . morphine  30 mg Oral Q12H  . potassium chloride SA  40 mEq Oral Daily  . rivaroxaban  20 mg Oral QAC supper  . senna-docusate  1 tablet Oral BID   Continuous Infusions: . dextrose 5 % and 0.45% NaCl 10 mL/hr at 03/16/16 0047    Principal Problem:   Chest pain syndrome Active Problems:   Pulmonary HTN   Chronic anticoagulation   Anemia of chronic disease   Sickle cell anemia (HCC)   Troponin level elevated   Chest pain    In excess of 25 minutes spent during this visit. Greater than 50% involved face to face contact with the patient for assessment, counseling and coordination of care.

## 2016-03-16 NOTE — Progress Notes (Signed)
Patient 02 sat  drops to 85% on 3L of oxygen at rest. HR 101.

## 2016-03-16 NOTE — Progress Notes (Signed)
RT called to see patient about low O2 sats. Patient 79% on 7 L [place] when RT arrived. Patient placed on NRB and patient O2 sats increased to 92-95%. RN at bedside.

## 2016-03-16 NOTE — Progress Notes (Signed)
Patient ID: [name], male   DOB: [birth year], 36 y.o.   MRN: [id number]  Spoke with Dr. Tamala Julian from Cardiology and advised him that patient had an ECHO on 11/11/2015. Based upon the recency of ECHO he advised to cancel the ECHO for today.  [name],[name]

## 2016-03-17 LAB — CBC WITH DIFFERENTIAL/PLATELET
Basophils Absolute: 0.1 10*3/uL (ref 0.0–0.1)
Basophils Relative: 1 %
Eosinophils Absolute: 0.4 10*3/uL (ref 0.0–0.7)
Eosinophils Relative: 4 %
HCT: 21.4 % — ABNORMAL LOW (ref 39.0–52.0)
HEMOGLOBIN: 7.4 g/dL — AB (ref 13.0–17.0)
LYMPHS PCT: 16 %
Lymphs Abs: 1.7 10*3/uL (ref 0.7–4.0)
MCH: 29.7 pg (ref 26.0–34.0)
MCHC: 34.6 g/dL (ref 30.0–36.0)
MCV: 85.9 fL (ref 78.0–100.0)
MONOS PCT: 15 %
Monocytes Absolute: 1.6 10*3/uL — ABNORMAL HIGH (ref 0.1–1.0)
NEUTROS ABS: 7 10*3/uL (ref 1.7–7.7)
NEUTROS PCT: 64 %
PLATELETS: 487 10*3/uL — AB (ref 150–400)
RBC: 2.49 MIL/uL — AB (ref 4.22–5.81)
RDW: 23.2 % — ABNORMAL HIGH (ref 11.5–15.5)
WBC: 10.8 10*3/uL — ABNORMAL HIGH (ref 4.0–10.5)

## 2016-03-17 LAB — BLOOD GAS, ARTERIAL
Acid-base deficit: 1.3 mmol/L (ref 0.0–2.0)
BICARBONATE: 23.4 mmol/L (ref 20.0–28.0)
Drawn by: 295031
O2 CONTENT: 4 L/min
O2 SAT: 63.7 %
PATIENT TEMPERATURE: 98.6
PCO2 ART: 42.1 mmHg (ref 32.0–48.0)
PO2 ART: 40.6 mmHg — AB (ref 83.0–108.0)
pH, Arterial: 7.364 (ref 7.350–7.450)

## 2016-03-17 LAB — BASIC METABOLIC PANEL
Anion gap: 3 — ABNORMAL LOW (ref 5–15)
BUN: 13 mg/dL (ref 6–20)
CALCIUM: 8.8 mg/dL — AB (ref 8.9–10.3)
CHLORIDE: 111 mmol/L (ref 101–111)
CO2: 26 mmol/L (ref 22–32)
CREATININE: 0.83 mg/dL (ref 0.61–1.24)
Glucose, Bld: 110 mg/dL — ABNORMAL HIGH (ref 65–99)
Potassium: 5.6 mmol/L — ABNORMAL HIGH (ref 3.5–5.1)
SODIUM: 140 mmol/L (ref 135–145)

## 2016-03-17 LAB — TYPE AND SCREEN
BLOOD PRODUCT EXPIRATION DATE: 201803052359
ISSUE DATE / TIME: 201802141547
UNIT TYPE AND RH: 5100

## 2016-03-17 LAB — RETICULOCYTES
RBC.: 2.49 MIL/uL — AB (ref 4.22–5.81)
RETIC COUNT ABSOLUTE: 266.4 10*3/uL — AB (ref 19.0–186.0)
RETIC CT PCT: 10.7 % — AB (ref 0.4–3.1)

## 2016-03-17 MED ORDER — HYDROXYUREA 500 MG PO CAPS
1000.0000 mg | ORAL_CAPSULE | Freq: Every day | ORAL | Status: DC
  Administered 2016-03-17 – 2016-03-19 (×3): 1000 mg via ORAL
  Filled 2016-03-17 (×3): qty 2

## 2016-03-17 MED ORDER — CARVEDILOL 3.125 MG PO TABS: 3.1250 mg | ORAL_TABLET | Freq: Two times a day (BID) | ORAL | 1 refills | Status: DC

## 2016-03-17 MED ORDER — FUROSEMIDE 10 MG/ML IJ SOLN
40.0000 mg | Freq: Once | INTRAMUSCULAR | Status: AC
  Administered 2016-03-17: 40 mg via INTRAVENOUS
  Filled 2016-03-17: qty 4

## 2016-03-17 MED ORDER — HYDROXYUREA 500 MG PO CAPS: 1000.0000 mg | ORAL_CAPSULE | Freq: Every day | ORAL | Status: DC

## 2016-03-17 MED ORDER — HYDROMORPHONE 1 MG/ML IV SOLN
INTRAVENOUS | Status: DC
  Administered 2016-03-17: 6 mg via INTRAVENOUS
  Administered 2016-03-17: 16:00:00 via INTRAVENOUS
  Administered 2016-03-18: 0.6 mg via INTRAVENOUS
  Administered 2016-03-18: 8.4 mg via INTRAVENOUS
  Administered 2016-03-18 (×2): via INTRAVENOUS
  Administered 2016-03-18: 9.2 mg via INTRAVENOUS
  Administered 2016-03-18: 4.79 mg via INTRAVENOUS
  Administered 2016-03-18: 6 mg via INTRAVENOUS
  Administered 2016-03-18: 10.8 mg via INTRAVENOUS
  Administered 2016-03-19: 16 mg via INTRAVENOUS
  Administered 2016-03-19: 12:00:00 via INTRAVENOUS
  Administered 2016-03-19: 6.79 mg via INTRAVENOUS
  Administered 2016-03-19: 2.4 mg via INTRAVENOUS
  Administered 2016-03-19: 9 mg via INTRAVENOUS
  Administered 2016-03-19: 0.6 mg via INTRAVENOUS
  Filled 2016-03-17 (×5): qty 25

## 2016-03-17 NOTE — Care Management Important Message (Signed)
Important Message  Patient Details  Name: [name] MRN: [id number] Date of Birth: [birth year]   Medicare Important Message Given:  Yes    Kerin Salen 03/17/2016, 12:16 [place] Message  Patient Details  Name: [name] MRN: [id number] Date of Birth: 1981/01/24   Medicare Important Message Given:  Yes    Kerin Salen 03/17/2016, 12:16 PM

## 2016-03-17 NOTE — Progress Notes (Signed)
SICKLE CELL SERVICE PROGRESS NOTE  [name] [id number] DOB: [birth year] DOA: 03/13/2016 PCP: [name], MD  Assessment/Plan: Principal Problem:   Chest pain syndrome Active Problems:   Pulmonary HTN   Chronic anticoagulation   Anemia of chronic disease   Sickle cell anemia (HCC)   Troponin level elevated   Chest pain  1. Hb SS with crisis: Continue PCA dose to 0.6 mg and continue Toradol. Discontinue clinician assisted doses and continue schedule oral Dilaudid. 2. Hyperkalemia: Potassium mildly elevated. Will discontinue supplemental potassium and hold Toradol. Repeat electrolytes tomorrow.  3. 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 Increase Oxygen to 5 l/min with ambulation and accept saturations of >86%.  Unfortunately I am sure that his pulmonary HTN is worsening as a result of prolonged Hypoxia and patient is still non-adherent to wearing Oxygen as prescribed. 4. Hypoxia: Pt has no evidence of pneumonia. It is my opinion that the findings on the CT scan are related to hypoxia and maybe constriction of vessels as the changes appear to occur in the periphery. Additionally he has no other clinical signs of a bacterial infection so will discontinue antibiotics and observe.  5. Prolonged QT Interval: Zofran and Azithromycin discontinued. QT interval improved. Elevated Troponin: Suspect demand ischemia associated with hypoxia. Troponin trending down. [facility] input. 6. Leukocytosis: Improved. Likely related to crisis.  7. Anemia of chronic disease:  Hb 7.4 g/dL post-transfusion. Will resume Hydrea. 8.  Chronic pain: Continue MS Contin 9. Chronic Anticoagulation: Pt on Xarelto for recurrent PE's. Continue Xarelto.   Code Status: Full Code Family Communication: N/A Disposition Plan: Anticipate discharge home tomorrow.  [name],[name]  Pager [phone]. If 7PM-7AM, please  contact night-coverage.  03/17/2016, 2:28 PM  LOS: 4 days   Interim History: Pt reports pain at 6/10 and localized to ribs. He had a BM last night. He used 35.2 mg with 52/52:demands/deliveries in the past 24 hours. Pt ambulated with 4 l/min of Oxygen and ABG immediately shows oxygen tension of 40.6 mmHg.   Consultants:  None  Procedures:  None  Antibiotics:  Azithromycin 2/11 >>2/12  Rocephin 2/11 >>2/12  Objective: Vitals:   03/17/16 0430 03/17/16 0800 03/17/16 1159 03/17/16 1410  BP: 100/65   121/74  Pulse: 77   78  Resp: 17 (!) 21 17 20   Temp: 98 F (36.7 C)   98.1 F (36.7 C)  TempSrc: Oral   Oral  SpO2: 96% 99% 92% 93%  Weight:      Height:       Weight change:   Intake/Output Summary (Last 24 hours) at 03/17/16 1428 Last data filed at 03/17/16 1410  Gross per 24 hour  Intake          1444.17 ml  Output             4350 ml  Net         -2905.83 ml    General: Alert, awake, oriented x3, in no acute distress. Appears at baseline.  HEENT: [place]/AT PEERL, EOMI, anicteric Neck: Trachea midline,  no masses, no thyromegal,y no JVD, no carotid bruit OROPHARYNX:  Moist, No exudate/ erythema/lesions.  Heart: Regular rate and rhythm, II/VI systolic ejection murmurs at base, no rubs or gallops, PMI non-displaced, no heaves or thrills on palpation.  Lungs: Clear to auscultation, no wheezing or rhonchi noted. No increased vocal fremitus resonant to percussion  Abdomen: Soft, nontender, nondistended, positive  bowel sounds, no masses no hepatosplenomegaly noted.  Neuro: No focal neurological deficits noted cranial nerves II through XII grossly intact. . Strength at functional baseline in bilateral upper and lower extremities. Musculoskeletal: No warmth swelling or erythema around joints, no spinal tenderness noted. Psychiatric: Patient alert and oriented x3, good insight and cognition, good recent to remote recall.    Data Reviewed: Basic Metabolic Panel:  Recent  Labs Lab 03/13/16 0820 03/14/16 0222 03/14/16 1348 03/15/16 0531 03/16/16 0547 03/17/16 1205  NA 138 137  --  136 139 140  K 3.8 3.9  --  4.1 4.9 5.6*  CL 109 106  --  108 112* 111  CO2 22 23  --  22 23 26   GLUCOSE 127* 139*  --  190* 117* 110*  BUN 8 13  --  12 14 13   CREATININE 0.86 0.97  --  0.90 0.88 0.83  CALCIUM 8.9 8.5*  --  8.2* 8.8* 8.8*  MG  --   --  1.8  --   --   --    Liver Function Tests:  Recent Labs Lab 03/11/16 0830 03/13/16 0820 03/14/16 0222  AST 41 39 43*  ALT 30 24 23   ALKPHOS 85 90 92  BILITOT 4.1* 5.0* 3.2*  PROT 7.5 7.7 7.7  ALBUMIN 4.2 4.3 4.1   No results for input(s): LIPASE, AMYLASE in the last 168 hours. No results for input(s): AMMONIA in the last 168 hours. CBC:  Recent Labs Lab 03/13/16 0820 03/14/16 0222 03/15/16 1123 03/16/16 0547 03/17/16 1205  WBC 15.5* 17.9* 11.7* 12.2* 10.8*  NEUTROABS 9.1* 11.1* 8.2* 7.1 7.0  HGB 7.4* 7.3* 7.1* 6.9* 7.4*  HCT 21.6* 21.5* 21.0* 20.1* 21.4*  MCV 90.4 91.5 92.1 91.8 85.9  PLT 514* 497* 495* 483* 487*   Cardiac Enzymes:  Recent Labs Lab 03/15/16 1431 03/15/16 2003 03/16/16 0159 03/16/16 0802 03/16/16 1449  TROPONINI 0.17* 0.20* 0.20* 0.17* 0.15*   BNP (last 3 results)  Recent Labs  11/28/15 0520 02/11/16 1006 03/07/16 0535  BNP 422.5* 381.0* 319.6*    ProBNP (last 3 results) No results for input(s): PROBNP in the last 8760 hours.  CBG: No results for input(s): GLUCAP in the last 168 hours.  Recent Results (from the past 240 hour(s))  Culture, blood (routine x 2)     Status: None (Preliminary result)   Collection Time: 03/13/16 10:40 AM  Result Value Ref Range Status   Specimen Description BLOOD RIGHT HAND  Final   Special Requests BOTTLES DRAWN AEROBIC ONLY [place]  Final   Culture   Final    NO GROWTH 3 DAYS Performed at [facility], [address], [place], [place] [address]    Report Status PENDING  Incomplete  Culture, blood (routine x 2)     Status: None  (Preliminary result)   Collection Time: 03/13/16 10:50 AM  Result Value Ref Range Status   Specimen Description BLOOD PORTA CATH  Final   Special Requests BOTTLES DRAWN AEROBIC AND ANAEROBIC 5 CC EACH  Final   Culture   Final    NO GROWTH 3 DAYS Performed at [facility], [place] [address], [place], [place] [address]    Report Status PENDING  Incomplete     Studies: Dg Chest 2 View  Result Date: 03/11/2016 CLINICAL DATA:  Sickle cell pain crisis with bilateral chest pain beginning this morning. EXAM: CHEST  2 VIEW COMPARISON:  03/06/2016 FINDINGS: Moderate cardiomegaly remains stable. Left-sided power port remains  in appropriate position. Mild scarring and peripheral right midlung is unchanged. No evidence of acute pulmonary infiltrate or edema. No evidence of pleural effusion. IMPRESSION: Stable cardiomegaly.  No active lung disease. Electronically Signed   By: Earle Gell M.D.   On: 03/11/2016 08:55   Dg Chest 2 View  Result Date: 03/06/2016 CLINICAL DATA:  36 year old male with right rib pain and sickle cell crisis. Initial encounter. EXAM: CHEST  2 VIEW COMPARISON:  02/29/2016 and earlier. FINDINGS: Stable cardiomegaly and mediastinal contours. Stable left chest power port, currently accessed. Interval resolved patchy and nodular right upper lung opacity. No pneumothorax, pulmonary edema, pleural effusion or acute pulmonary opacity. Stable basilar predominant mild chronic increased interstitial markings. Negative visible bowel gas pattern. Stable cholecystectomy clips. No acute osseous abnormality identified. IMPRESSION: Interval resolved right upper lobe opacity. Stable cardiomegaly with no acute cardiopulmonary abnormality. Electronically Signed   By: Genevie Ann M.D.   On: 03/06/2016 10:46   Dg Chest 2 View  Result Date: 02/29/2016 CLINICAL DATA:  Hypoxia and weakness.  Sickle cell. EXAM: CHEST  2 VIEW COMPARISON:  02/23/2016. FINDINGS: Trachea is midline. Left subclavian power port tip is  in the high right atrium. Heart is enlarged, stable. Mild patchy right upper lobe airspace opacification. Streaky atelectasis or scarring in the left lower lobe. No pleural fluid. IMPRESSION: Mild right upper lobe patchy airspace opacification. Electronically Signed   By: Lorin Picket M.D.   On: 02/29/2016 12:53   Dg Chest 2 View  Result Date: 02/23/2016 CLINICAL DATA:  SICKLE CELL CRISIS EXAM: CHEST  2 VIEW COMPARISON:  CT 02/21/2016 FINDINGS: LEFT-SIDED POWER PORT UNCHANGED. STABLE CARDIAC SILHOUETTE. THERE IS MILD CENTRAL VENOUS CONGESTION. NO FOCAL INFILTRATE. NO PLEURAL FLUID. NO PNEUMOTHORAX. IMPRESSION: 1. STABLE CARDIOMEGALY. 2. STABLE CENTRAL VENOUS PULMONARY CONGESTION. 3. NO EVIDENCE OF PULMONARY INFILTRATE Electronically Signed   By: Suzy Bouchard M.D.   On: 02/23/2016 16:35   Dg Chest 2 View  Result Date: 02/21/2016 CLINICAL DATA:  New onset chest pain beginning today. EXAM: CHEST  2 VIEW COMPARISON:  02/19/2016 FINDINGS: Chronic enlargement of the cardiac silhouette. Power port tip in the SVC just above the right atrium as seen previously. Pulmonary venous hypertension, possibly with early interstitial edema. No advanced edema. No consolidation or collapse. No effusions. IMPRESSION: Chronic cardiomegaly. Worsening of the pulmonary vascular pattern suggesting venous hypertension with early interstitial edema. Electronically Signed   By: Nelson Chimes M.D.   On: 02/21/2016 07:49   Dg Chest 2 View  Result Date: 02/19/2016 CLINICAL DATA:  Sickle cell pain crisis, worsening bilat lower chest pain since yesterday EXAM: CHEST  2 VIEW COMPARISON:  02/11/2016 FINDINGS: Left Port-A-Cath is unchanged in position. Midline trachea. Moderate cardiomegaly. Mediastinal contours otherwise within normal limits. No pleural effusion or pneumothorax. No congestive failure. Lower lobe predominant pulmonary interstitial thickening is chronic and nonspecific. No lobar consolidation. IMPRESSION: Cardiomegaly  with chronic interstitial thickening. No acute findings. Electronically Signed   By: Abigail Miyamoto M.D.   On: 02/19/2016 07:18   Ct Angio Chest Pe W And/or Wo Contrast  Result Date: 03/13/2016 CLINICAL DATA:  History of sickle cell. Left-sided chest pain starting this morning about 3 a.m. EXAM: CT ANGIOGRAPHY CHEST WITH CONTRAST TECHNIQUE: Multidetector CT imaging of the chest was performed using the standard protocol during bolus administration of intravenous contrast. Multiplanar CT image reconstructions and MIPs were obtained to evaluate the vascular anatomy. CONTRAST:  100 mL Isovue 370 COMPARISON:  CT chest 03/06/2016, 02/21/2016, 01/05/2016 FINDINGS: Cardiovascular: Satisfactory  opacification of the pulmonary arteries to the segmental level. No evidence of pulmonary embolism. Stable cardiomegaly. No pericardial effusion. Normal caliber thoracic aorta. No thoracic aortic atherosclerosis or dissection. Left-sided Port-A-Cath again noted. Mediastinum/Nodes: Thyroid gland, trachea, and esophagus demonstrate no significant findings. Multiple small prevascular lymph nodes unchanged compared with the prior exams. Mildly enlarged stable subcarinal lymph node. No axillary lymphadenopathy. Lungs/Pleura: Patchy areas of ground-glass opacities in bilateral upper lobes and lower lobes which may reflect mild pulmonary edema versus alveolitis secondary to an infectious or inflammatory etiology. No pleural effusion or pneumothorax. Upper Abdomen: No acute abnormality in the upper abdomen. Chronic calcified, involuted spleen. Musculoskeletal: No acute osseous abnormality. No lytic or sclerotic osseous lesion. Soft tissue: Bilateral retroareolar fibroglandular soft tissue as can be seen with gynecomastia. Review of the MIP images confirms the above findings. IMPRESSION: 1. No evidence of acute pulmonary embolus. 2. Stable cardiomegaly. 3. Patchy areas of ground-glass opacities in bilateral upper lobes and lower lobes which  may reflect mild pulmonary edema versus alveolitis secondary to an infectious or inflammatory etiology. Electronically Signed   By: Kathreen Devoid   On: 03/13/2016 10:13   Ct Angio Chest Pe W And/or Wo Contrast  Result Date: 03/06/2016 CLINICAL DATA:  Chest pain, new hypoxia.  Sickle cell disease. EXAM: CT ANGIOGRAPHY CHEST WITH CONTRAST TECHNIQUE: Multidetector CT imaging of the chest was performed using the standard protocol during bolus administration of intravenous contrast. Multiplanar CT image reconstructions and MIPs were obtained to evaluate the vascular anatomy. CONTRAST:  100 cc Isovue 370 Additional 80 cc Isovue 370 given due to port leakage and patient motion. COMPARISON:  Chest CT angiogram dated 02/21/2016. FINDINGS: Cardiovascular: Despite repeat imaging, the peripheral segmental and subsegmental pulmonary arteries to the lower lobes are difficult to definitively characterize due to patient motion artifact and suboptimal contrast opacification. There is, however, no pulmonary embolism identified within the main, lobar or central segmental pulmonary arteries bilaterally. Patient has had chronic lower lobe subsegmental pulmonary emboli described on previous exams, not able to be delineated again on today's study due to the patient motion artifact and suboptimal contrast opacification. Thoracic aorta is normal in caliber and configuration. Heart is prominently enlarged, stable compared to the previous exam. No pericardial effusion seen. Mediastinum/Nodes: Numerous small and moderate-sized lymph nodes within the anterior mediastinum are stable. Normal residual thymic tissue is also noted within the anterior mediastinum. Esophagus appears normal. Trachea and central bronchi are unremarkable. Lungs/Pleura: Mild bibasilar scarring/atelectasis is stable. Lungs otherwise clear. No evidence of pneumonia. No pleural effusion or pneumothorax. Upper Abdomen: Limited images of the upper abdomen are unremarkable.  Musculoskeletal: No acute or suspicious osseous lesion. Superficial soft tissues are unremarkable. Review of the MIP images confirms the above findings. IMPRESSION: 1. No acute pulmonary embolus identified, with mild study limitations detailed above. 2. Stable cardiomegaly. 3. No pneumonia or pulmonary edema. Stable mild scarring/fibrosis at the lung bases. 4. Stable mediastinal lymphadenopathy. Electronically Signed   By: Franki Cabot M.D.   On: 03/06/2016 13:30   Ct Angio Chest Pe W Or Wo Contrast  Result Date: 02/21/2016 CLINICAL DATA:  Sickle cell disease.  Chest pain, bilateral rib pain EXAM: CT ANGIOGRAPHY CHEST WITH CONTRAST TECHNIQUE: Multidetector CT imaging of the chest was performed using the standard protocol during bolus administration of intravenous contrast. Multiplanar CT image reconstructions and MIPs were obtained to evaluate the vascular anatomy. CONTRAST:  100 mL Isovue 370 COMPARISON:  01/05/2016, 11/10/2015 FINDINGS: Cardiovascular: Satisfactory opacification of the central and lobar pulmonary arteries  with somewhat suboptimal opacification of the subsegmental pulmonary arteries. Chronic bilateral lower lobe subsegmental occlusive pulmonary emboli. No acute pulmonary embolus. Main pulmonary artery is enlarged measuring 4.2 cm in diameter. Right heart strain (RV/ LV equals 1.0). Severe cardiomegaly. Trace pericardial effusion. Thoracic aorta is normal in caliber. No thoracic aortic dissection or aneurysm. Mediastinum/Nodes: Mild mediastinal lymphadenopathy is unchanged compared with multiple prior examinations. Left-sided Port-A-Cath with the tip at the cavoatrial junction. Normal esophagus. Normal thyroid. Lungs/Pleura: No pleural effusion or pneumothorax. Bilateral patchy areas of ground-glass opacity in the upper and lower lobes both central and peripheral distribution similar in appearance to the prior exam. No focal consolidation, pleural effusion or pneumothorax. Upper Abdomen:  Involuted and calcified spleen consistent with history of sickle cell disease. No acute upper abdominal abnormality. Musculoskeletal: No acute osseous abnormality. 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 Review of the MIP images confirms the above findings. IMPRESSION: 1. No acute pulmonary embolus. Chronic bilateral lower lobe subsegmental pulmonary emboli unchanged in the prior exam. Chronic right heart strain with dilatation of the main pulmonary artery as can be seen with pulmonary arterial hypertension. 2. Stable cardiomegaly. 3. Patchy ground-glass opacities bilaterally suggesting mild pulmonary edema versus an infectious or inflammatory etiology. 4. Stable mediastinal lymphadenopathy. Electronically Signed   By: Kathreen Devoid   On: 02/21/2016 10:46    Scheduled Meds: . sodium chloride   Intravenous Once  . ambrisentan  5 mg Oral Daily  . aspirin  81 mg Oral Daily  . carvedilol  3.125 mg Oral BID WC  . cholecalciferol  2,000 Units Oral Daily  . Deferasirox  1,080 mg Oral Daily  . folic acid  1 mg Oral Daily  . furosemide  40 mg Intravenous Once  . gabapentin  300 mg Oral TID  . HYDROmorphone   Intravenous Q4H  . HYDROmorphone  4 mg Oral Q4H  . ketorolac  30 mg Intravenous Q6H  . morphine  30 mg Oral Q12H  . potassium chloride SA  40 mEq Oral Daily  . rivaroxaban  20 mg Oral QAC supper  . senna-docusate  1 tablet Oral BID   Continuous Infusions: . dextrose 5 % and 0.45% NaCl 10 mL/hr at 03/16/16 0047    Principal Problem:   Chest pain syndrome Active Problems:   Pulmonary HTN   Chronic anticoagulation   Anemia of chronic disease   Sickle cell anemia (HCC)   Troponin level elevated   Chest pain    In excess of 25 minutes spent during this visit. Greater than 50% involved face to face contact with the patient for assessment, counseling and coordination of care.

## 2016-03-18 LAB — BASIC METABOLIC PANEL
Anion gap: 4 — ABNORMAL LOW (ref 5–15)
BUN: 17 mg/dL (ref 6–20)
CALCIUM: 8.9 mg/dL (ref 8.9–10.3)
CO2: 27 mmol/L (ref 22–32)
CREATININE: 1.04 mg/dL (ref 0.61–1.24)
Chloride: 108 mmol/L (ref 101–111)
GFR calc Af Amer: >60 mL/min (ref >60)
GFR calc non Af Amer: >60 mL/min (ref >60)
GLUCOSE: 134 mg/dL — AB (ref 65–99)
Potassium: 4.8 mmol/L (ref 3.5–5.1)
Sodium: 139 mmol/L (ref 135–145)

## 2016-03-18 LAB — CULTURE, BLOOD (ROUTINE X 2)
CULTURE: NO GROWTH
Culture: NO GROWTH

## 2016-03-18 NOTE — Progress Notes (Signed)
SICKLE CELL SERVICE PROGRESS NOTE  [name] [id number] DOB: Feb 04, 1980 DOA: 03/13/2016 PCP: [name], MD  Assessment/Plan: Principal Problem:   Chest pain syndrome Active Problems:   Pulmonary HTN   Chronic anticoagulation   Sickle cell anemia (HCC)   Anemia of chronic disease   Troponin level elevated   Chest pain  1. Hb SS with crisis: Patient has worsened pain this am. Continue PCA dose to 0.6 mg and continue Toradol. Continue oral Dilaudid. No Clinician assisted dosing. 2. Hyperkalemia: Resolved. Monitor 3. Acute on Chronic Hypoxic Respiratory Failure due to Secondary PAH and Recurrent PE: Patient is on 4L/min at rest and comfortable. Need set up of 5L/min with ambulation at home and patient counseled to remain compliant with his Oxygen. 4. Hypoxia: Pt has no evidence of pneumonia. It is my opinion that the findings on the CT scan are related to hypoxia and maybe constriction of vessels as the changes appear to occur in the periphery. Additionally he has no other clinical signs of a bacterial infection so antibiotics discontinued.  5. Prolonged QT Interval: Zofran and Azithromycin discontinued. QT interval improved. Elevated Troponin: Suspect demand ischemia associated with hypoxia. Troponin trending down. [facility] input. 6. Leukocytosis: Improved. Likely related to crisis.  7. Anemia of chronic disease:  Hb has been 7.4 g/dL  post-transfusion. Continue Hydrea. 8.  Chronic pain: Continue MS Contin 9. Chronic Anticoagulation: Pt on Xarelto for recurrent PE's. Continue Xarelto.   Code Status: Full Code Family Communication: N/A Disposition Plan: Anticipate discharge home tomorrow.  [facility]  Pager [phone]. If 7PM-7AM, please contact night-coverage.  03/18/2016, 5:59 PM  LOS: 5 days   Interim History: Pt reports pain at 6/10 and localized to ribs. He is drowsy today.  Consultants:  None  Procedures:  None  Antibiotics:  Azithromycin  2/11 >>2/12  Rocephin 2/11 >>2/12  Objective: Vitals:   03/18/16 0741 03/18/16 0821 03/18/16 1200 03/18/16 1258  BP:    100/63  Pulse:    73  Resp: 12 12 12 18   Temp:    97.8 F (36.6 C)  TempSrc:    Oral  SpO2: 95% 95% 95% 91%  Weight:      Height:       Weight change:   Intake/Output Summary (Last 24 hours) at 03/18/16 1759 Last data filed at 03/18/16 1704  Gross per 24 hour  Intake           475.83 ml  Output             2675 ml  Net         -2199.17 ml    General: Alert, awake, oriented x3, in no acute distress. Appears at baseline.  HEENT: [place]/AT PEERL, EOMI, anicteric Neck: Trachea midline,  no masses, no thyromegal,y no JVD, no carotid bruit OROPHARYNX:  Moist, No exudate/ erythema/lesions.  Heart: Regular rate and rhythm, II/VI systolic ejection murmurs at base, no rubs or gallops, PMI non-displaced, no heaves or thrills on palpation.  Lungs: Clear to auscultation, no wheezing or rhonchi noted. No increased vocal fremitus resonant to percussion  Abdomen: Soft, nontender, nondistended, positive bowel sounds, no masses no hepatosplenomegaly noted.  Neuro: No focal neurological deficits noted cranial nerves II through XII grossly intact. . Strength at functional baseline in bilateral upper and lower extremities. Musculoskeletal: No warmth swelling or erythema around joints, no spinal tenderness noted. Psychiatric: Patient alert and oriented x3, good insight and cognition, good recent to remote recall.    Data Reviewed: Basic  Metabolic Panel:  Recent Labs Lab 03/14/16 0222 03/14/16 1348 03/15/16 0531 03/16/16 0547 03/17/16 1205 03/18/16 0359  NA 137  --  136 139 140 139  K 3.9  --  4.1 4.9 5.6* 4.8  CL 106  --  108 112* 111 108  CO2 23  --  22 23 26 27   GLUCOSE 139*  --  190* 117* 110* 134*  BUN 13  --  12 14 13 17   CREATININE 0.97  --  0.90 0.88 0.83 1.04  CALCIUM 8.5*  --  8.2* 8.8* 8.8* 8.9  MG  --  1.8  --   --   --   --    Liver Function  Tests:  Recent Labs Lab 03/13/16 0820 03/14/16 0222  AST 39 43*  ALT 24 23  ALKPHOS 90 92  BILITOT 5.0* 3.2*  PROT 7.7 7.7  ALBUMIN 4.3 4.1   No results for input(s): LIPASE, AMYLASE in the last 168 hours. No results for input(s): AMMONIA in the last 168 hours. CBC:  Recent Labs Lab 03/13/16 0820 03/14/16 0222 03/15/16 1123 03/16/16 0547 03/17/16 1205  WBC 15.5* 17.9* 11.7* 12.2* 10.8*  NEUTROABS 9.1* 11.1* 8.2* 7.1 7.0  HGB 7.4* 7.3* 7.1* 6.9* 7.4*  HCT 21.6* 21.5* 21.0* 20.1* 21.4*  MCV 90.4 91.5 92.1 91.8 85.9  PLT 514* 497* 495* 483* 487*   Cardiac Enzymes:  Recent Labs Lab 03/15/16 1431 03/15/16 2003 03/16/16 0159 03/16/16 0802 03/16/16 1449  TROPONINI 0.17* 0.20* 0.20* 0.17* 0.15*   BNP (last 3 results)  Recent Labs  11/28/15 0520 02/11/16 1006 03/07/16 0535  BNP 422.5* 381.0* 319.6*    ProBNP (last 3 results) No results for input(s): PROBNP in the last 8760 hours.  CBG: No results for input(s): GLUCAP in the last 168 hours.  Recent Results (from the past 240 hour(s))  Culture, blood (routine x 2)     Status: None   Collection Time: 03/13/16 10:40 AM  Result Value Ref Range Status   Specimen Description BLOOD RIGHT HAND  Final   Special Requests BOTTLES DRAWN AEROBIC ONLY [place]  Final   Culture   Final    NO GROWTH 5 DAYS Performed at [facility], [address] [place], [place] [address]    Report Status 03/18/2016 FINAL  Final  Culture, blood (routine x 2)     Status: None   Collection Time: 03/13/16 10:50 AM  Result Value Ref Range Status   Specimen Description BLOOD PORTA CATH  Final   Special Requests BOTTLES DRAWN AEROBIC AND ANAEROBIC 5 CC EACH  Final   Culture   Final    NO GROWTH 5 DAYS Performed at [facility], [place] [address], [place], [place] [address]    Report Status 03/18/2016 FINAL  Final     Studies: Dg Chest 2 View  Result Date: 03/11/2016 CLINICAL DATA:  Sickle cell pain crisis with bilateral chest  pain beginning this morning. EXAM: CHEST  2 VIEW COMPARISON:  03/06/2016 FINDINGS: Moderate cardiomegaly remains stable. Left-sided power port remains in appropriate position. Mild scarring and peripheral right midlung is unchanged. No evidence of acute pulmonary infiltrate or edema. No evidence of pleural effusion. IMPRESSION: Stable cardiomegaly.  No active lung disease. Electronically Signed   By: Earle Gell M.D.   On: 03/11/2016 08:55   Dg Chest 2 View  Result Date: 03/06/2016 CLINICAL DATA:  36 year old male with right rib pain and sickle cell crisis. Initial encounter. EXAM: CHEST  2  VIEW COMPARISON:  02/29/2016 and earlier. FINDINGS: Stable cardiomegaly and mediastinal contours. Stable left chest power port, currently accessed. Interval resolved patchy and nodular right upper lung opacity. No pneumothorax, pulmonary edema, pleural effusion or acute pulmonary opacity. Stable basilar predominant mild chronic increased interstitial markings. Negative visible bowel gas pattern. Stable cholecystectomy clips. No acute osseous abnormality identified. IMPRESSION: Interval resolved right upper lobe opacity. Stable cardiomegaly with no acute cardiopulmonary abnormality. Electronically Signed   By: Genevie Ann M.D.   On: 03/06/2016 10:46   Dg Chest 2 View  Result Date: 02/29/2016 CLINICAL DATA:  Hypoxia and weakness.  Sickle cell. EXAM: CHEST  2 VIEW COMPARISON:  02/23/2016. FINDINGS: Trachea is midline. Left subclavian power port tip is in the high right atrium. Heart is enlarged, stable. Mild patchy right upper lobe airspace opacification. Streaky atelectasis or scarring in the left lower lobe. No pleural fluid. IMPRESSION: Mild right upper lobe patchy airspace opacification. Electronically Signed   By: Lorin Picket M.D.   On: 02/29/2016 12:53   Dg Chest 2 View  Result Date: 02/23/2016 CLINICAL DATA:  SICKLE CELL CRISIS EXAM: CHEST  2 VIEW COMPARISON:  CT 02/21/2016 FINDINGS: LEFT-SIDED POWER PORT  UNCHANGED. STABLE CARDIAC SILHOUETTE. THERE IS MILD CENTRAL VENOUS CONGESTION. NO FOCAL INFILTRATE. NO PLEURAL FLUID. NO PNEUMOTHORAX. IMPRESSION: 1. STABLE CARDIOMEGALY. 2. STABLE CENTRAL VENOUS PULMONARY CONGESTION. 3. NO EVIDENCE OF PULMONARY INFILTRATE Electronically Signed   By: Suzy Bouchard M.D.   On: 02/23/2016 16:35   Dg Chest 2 View  Result Date: 02/21/2016 CLINICAL DATA:  New onset chest pain beginning today. EXAM: CHEST  2 VIEW COMPARISON:  02/19/2016 FINDINGS: Chronic enlargement of the cardiac silhouette. Power port tip in the SVC just above the right atrium as seen previously. Pulmonary venous hypertension, possibly with early interstitial edema. No advanced edema. No consolidation or collapse. No effusions. IMPRESSION: Chronic cardiomegaly. Worsening of the pulmonary vascular pattern suggesting venous hypertension with early interstitial edema. Electronically Signed   By: Nelson Chimes M.D.   On: 02/21/2016 07:49   Dg Chest 2 View  Result Date: 02/19/2016 CLINICAL DATA:  Sickle cell pain crisis, worsening bilat lower chest pain since yesterday EXAM: CHEST  2 VIEW COMPARISON:  02/11/2016 FINDINGS: Left Port-A-Cath is unchanged in position. Midline trachea. Moderate cardiomegaly. Mediastinal contours otherwise within normal limits. No pleural effusion or pneumothorax. No congestive failure. Lower lobe predominant pulmonary interstitial thickening is chronic and nonspecific. No lobar consolidation. IMPRESSION: Cardiomegaly with chronic interstitial thickening. No acute findings. Electronically Signed   By: Abigail Miyamoto M.D.   On: 02/19/2016 07:18   Ct Angio Chest Pe W And/or Wo Contrast  Result Date: 03/13/2016 CLINICAL DATA:  History of sickle cell. Left-sided chest pain starting this morning about 3 a.m. EXAM: CT ANGIOGRAPHY CHEST WITH CONTRAST TECHNIQUE: Multidetector CT imaging of the chest was performed using the standard protocol during bolus administration of intravenous contrast.  Multiplanar CT image reconstructions and MIPs were obtained to evaluate the vascular anatomy. CONTRAST:  100 mL Isovue 370 COMPARISON:  CT chest 03/06/2016, 02/21/2016, 01/05/2016 FINDINGS: Cardiovascular: Satisfactory opacification of the pulmonary arteries to the segmental level. No evidence of pulmonary embolism. Stable cardiomegaly. No pericardial effusion. Normal caliber thoracic aorta. No thoracic aortic atherosclerosis or dissection. Left-sided Port-A-Cath again noted. Mediastinum/Nodes: Thyroid gland, trachea, and esophagus demonstrate no significant findings. Multiple small prevascular lymph nodes unchanged compared with the prior exams. Mildly enlarged stable subcarinal lymph node. No axillary lymphadenopathy. Lungs/Pleura: Patchy areas of ground-glass opacities in bilateral upper lobes and lower  lobes which may reflect mild pulmonary edema versus alveolitis secondary to an infectious or inflammatory etiology. No pleural effusion or pneumothorax. Upper Abdomen: No acute abnormality in the upper abdomen. Chronic calcified, involuted spleen. Musculoskeletal: No acute osseous abnormality. No lytic or sclerotic osseous lesion. Soft tissue: Bilateral retroareolar fibroglandular soft tissue as can be seen with gynecomastia. Review of the MIP images confirms the above findings. IMPRESSION: 1. No evidence of acute pulmonary embolus. 2. Stable cardiomegaly. 3. Patchy areas of ground-glass opacities in bilateral upper lobes and lower lobes which may reflect mild pulmonary edema versus alveolitis secondary to an infectious or inflammatory etiology. Electronically Signed   By: Kathreen Devoid   On: 03/13/2016 10:13   Ct Angio Chest Pe W And/or Wo Contrast  Result Date: 03/06/2016 CLINICAL DATA:  Chest pain, new hypoxia.  Sickle cell disease. EXAM: CT ANGIOGRAPHY CHEST WITH CONTRAST TECHNIQUE: Multidetector CT imaging of the chest was performed using the standard protocol during bolus administration of intravenous  contrast. Multiplanar CT image reconstructions and MIPs were obtained to evaluate the vascular anatomy. CONTRAST:  100 cc Isovue 370 Additional 80 cc Isovue 370 given due to port leakage and patient motion. COMPARISON:  Chest CT angiogram dated 02/21/2016. FINDINGS: Cardiovascular: Despite repeat imaging, the peripheral segmental and subsegmental pulmonary arteries to the lower lobes are difficult to definitively characterize due to patient motion artifact and suboptimal contrast opacification. There is, however, no pulmonary embolism identified within the main, lobar or central segmental pulmonary arteries bilaterally. Patient has had chronic lower lobe subsegmental pulmonary emboli described on previous exams, not able to be delineated again on today's study due to the patient motion artifact and suboptimal contrast opacification. Thoracic aorta is normal in caliber and configuration. Heart is prominently enlarged, stable compared to the previous exam. No pericardial effusion seen. Mediastinum/Nodes: Numerous small and moderate-sized lymph nodes within the anterior mediastinum are stable. Normal residual thymic tissue is also noted within the anterior mediastinum. Esophagus appears normal. Trachea and central bronchi are unremarkable. Lungs/Pleura: Mild bibasilar scarring/atelectasis is stable. Lungs otherwise clear. No evidence of pneumonia. No pleural effusion or pneumothorax. Upper Abdomen: Limited images of the upper abdomen are unremarkable. Musculoskeletal: No acute or suspicious osseous lesion. Superficial soft tissues are unremarkable. Review of the MIP images confirms the above findings. IMPRESSION: 1. No acute pulmonary embolus identified, with mild study limitations detailed above. 2. Stable cardiomegaly. 3. No pneumonia or pulmonary edema. Stable mild scarring/fibrosis at the lung bases. 4. Stable mediastinal lymphadenopathy. Electronically Signed   By: Franki Cabot M.D.   On: 03/06/2016 13:30   Ct  Angio Chest Pe W Or Wo Contrast  Result Date: 02/21/2016 CLINICAL DATA:  Sickle cell disease.  Chest pain, bilateral rib pain EXAM: CT ANGIOGRAPHY CHEST WITH CONTRAST TECHNIQUE: Multidetector CT imaging of the chest was performed using the standard protocol during bolus administration of intravenous contrast. Multiplanar CT image reconstructions and MIPs were obtained to evaluate the vascular anatomy. CONTRAST:  100 mL Isovue 370 COMPARISON:  01/05/2016, 11/10/2015 FINDINGS: Cardiovascular: Satisfactory opacification of the central and lobar pulmonary arteries with somewhat suboptimal opacification of the subsegmental pulmonary arteries. Chronic bilateral lower lobe subsegmental occlusive pulmonary emboli. No acute pulmonary embolus. Main pulmonary artery is enlarged measuring 4.2 cm in diameter. Right heart strain (RV/ LV equals 1.0). Severe cardiomegaly. Trace pericardial effusion. Thoracic aorta is normal in caliber. No thoracic aortic dissection or aneurysm. Mediastinum/Nodes: Mild mediastinal lymphadenopathy is unchanged compared with multiple prior examinations. Left-sided Port-A-Cath with the tip at the cavoatrial junction.  Normal esophagus. Normal thyroid. Lungs/Pleura: No pleural effusion or pneumothorax. Bilateral patchy areas of ground-glass opacity in the upper and lower lobes both central and peripheral distribution similar in appearance to the prior exam. No focal consolidation, pleural effusion or pneumothorax. Upper Abdomen: Involuted and calcified spleen consistent with history of sickle cell disease. No acute upper abdominal abnormality. Musculoskeletal: No acute osseous abnormality. 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 Review of the MIP images confirms the above findings. IMPRESSION: 1. No acute pulmonary embolus. Chronic bilateral lower lobe subsegmental pulmonary emboli unchanged in the prior exam. Chronic right heart strain with dilatation of  the main pulmonary artery as can be seen with pulmonary arterial hypertension. 2. Stable cardiomegaly. 3. Patchy ground-glass opacities bilaterally suggesting mild pulmonary edema versus an infectious or inflammatory etiology. 4. Stable mediastinal lymphadenopathy. Electronically Signed   By: Kathreen Devoid   On: 02/21/2016 10:46    Scheduled Meds: . sodium chloride   Intravenous Once  . ambrisentan  5 mg Oral Daily  . aspirin  81 mg Oral Daily  . carvedilol  3.125 mg Oral BID WC  . cholecalciferol  2,000 Units Oral Daily  . Deferasirox  1,080 mg Oral Daily  . folic acid  1 mg Oral Daily  . gabapentin  300 mg Oral TID  . HYDROmorphone   Intravenous Q4H  . HYDROmorphone  4 mg Oral Q4H  . hydroxyurea  1,000 mg Oral Daily  . morphine  30 mg Oral Q12H  . rivaroxaban  20 mg Oral QAC supper  . senna-docusate  1 tablet Oral BID   Continuous Infusions: . dextrose 5 % and 0.45% NaCl 10 mL/hr at 03/16/16 0047    Principal Problem:   Chest pain syndrome Active Problems:   Pulmonary HTN   Chronic anticoagulation   Sickle cell anemia (HCC)   Anemia of chronic disease   Troponin level elevated   Chest pain    In excess of 25 minutes spent during this visit. Greater than 50% involved face to face contact with the patient for assessment, counseling and coordination of care.

## 2016-03-19 MED ORDER — HEPARIN SOD (PORK) LOCK FLUSH 100 UNIT/ML IV SOLN
500.0000 [IU] | INTRAVENOUS | Status: DC | PRN
  Administered 2016-03-19: 500 [IU]
  Filled 2016-03-19 (×2): qty 5

## 2016-03-19 MED ORDER — HEPARIN SOD (PORK) LOCK FLUSH 100 UNIT/ML IV SOLN
500.0000 [IU] | INTRAVENOUS | Status: DC
  Filled 2016-03-19: qty 5

## 2016-03-19 NOTE — Discharge Summary (Signed)
Physician Discharge Summary  Patient ID: [name] MRN: [id number] DOB/AGE: 1981/01/18 36 y.o.  Admit date: 03/13/2016 Discharge date: 03/19/2016  Admission Diagnoses:  Discharge Diagnoses:  Principal Problem:   Chest pain syndrome Active Problems:   Pulmonary HTN   Chronic anticoagulation   Sickle cell anemia (HCC)   Anemia of chronic disease   Troponin level elevated   Chest pain   Discharged Condition: good  Hospital Course: A 36 year old gentleman with history of sickle cell disease admitted with acute chest syndrome. He had chest pain on admission was elevated troponin. Initial acute coronary syndrome was thought to be the cause. He has pulmonary hypertension and chronic oxygen use at home but has been noncompliant. Patient was evaluated by cardiology and ACS was ruled out. He had significant sickle cell painful crisis. He had prolonged course of treatment required high oxygen as well as high dose Dilaudid. He has gradually improved and has been transitioned to his home regimen. Patient is to use home O2 at 3-4 L by minute. This has been set up and this should be his baseline. With activities patient will go as high as 5 L by minute. He has been treated with antibiotics and other medications. Patient is currently back to baseline and would be discharged home to follow-up with PCP.  Consults: cardiology and pulmonary/intensive care  Significant Diagnostic Studies: labs: CBC is as CMP is as well as echocardiogram with a done  Treatments: IV hydration, antibiotics: vancomycin and Levaquin, analgesia: acetaminophen and Dilaudid, anticoagulation: Xarelto and respiratory therapy: O2 and albuterol/atropine nebulizer  Discharge Exam: Blood pressure 119/79, pulse 80, temperature 98.5 F (36.9 C), temperature source Oral, resp. rate 16, height 6' (1.829 m), weight 81.7 kg (180 lb 1.9 oz), SpO2 90 %. General appearance: alert, cooperative and no distress Head: Normocephalic,  without obvious abnormality, atraumatic Neck: no adenopathy, no carotid bruit, no JVD, supple, symmetrical, trachea midline and thyroid not enlarged, symmetric, no tenderness/mass/nodules Back: symmetric, no curvature. ROM normal. No CVA tenderness. Resp: clear to auscultation bilaterally Chest wall: no tenderness Cardio: regular rate and rhythm, S1, S2 normal, no murmur, click, rub or gallop GI: soft, non-tender; bowel sounds normal; no masses,  no organomegaly Extremities: extremities normal, atraumatic, no cyanosis or edema Pulses: 2+ and symmetric Skin: Skin color, texture, turgor normal. No rashes or lesions Neurologic: Grossly normal  Disposition: 01-Home or Self Care  Discharge Instructions    Diet - low sodium heart healthy    Complete by:  As directed    Increase activity slowly    Complete by:  As directed      Allergies as of 03/19/2016   No Known Allergies     Medication List    STOP taking these medications   potassium chloride SA 20 MEQ tablet Commonly known as:  K-DUR,KLOR-CON     TAKE these medications   ambrisentan 5 MG tablet Commonly known as:  LETAIRIS Take 5 mg by mouth daily.   aspirin 81 MG chewable tablet Chew 1 tablet (81 mg total) by mouth daily.   carvedilol 3.125 MG tablet Commonly known as:  COREG Take 1 tablet (3.125 mg total) by mouth 2 (two) times daily with a meal.   cholecalciferol 1000 units tablet Commonly known as:  VITAMIN D Take 2,000 Units by mouth daily.   folic acid 1 MG tablet Commonly known as:  FOLVITE Take 1 mg by mouth daily.   gabapentin 300 MG capsule Commonly known as:  NEURONTIN Take 1 capsule (300 mg  total) by mouth 3 (three) times daily.   HYDROmorphone 4 MG tablet Commonly known as:  DILAUDID Take 1 tablet (4 mg total) by mouth every 4 (four) hours as needed for moderate pain or severe pain.   hydroxyurea 500 MG capsule Commonly known as:  HYDREA Take 2 capsules (1,000 mg total) by mouth daily. May take  with food to minimize GI side effects. What changed:  additional instructions   JADENU 360 MG Tabs Generic drug:  Deferasirox Take 1,080 mg by mouth daily.   morphine 30 MG 12 hr tablet Commonly known as:  MS CONTIN Take 1 tablet (30 mg total) by mouth every 12 (twelve) hours.   rivaroxaban 20 MG Tabs tablet Commonly known as:  XARELTO Take 1 tablet (20 mg total) by mouth at bedtime.   zolpidem 10 MG tablet Commonly known as:  AMBIEN Take 1 tablet (10 mg total) by mouth at bedtime as needed for sleep.            Durable Medical Equipment        Start     Ordered   03/19/16 1511  DME Oxygen  Once    Question Answer Comment  Mode or (Route) Nasal cannula   Frequency Continuous (stationary and portable oxygen unit needed)   Oxygen conserving device Yes   Oxygen delivery system Gas      03/19/16 1510       Signed: Shelsea Hangartner,[name] 03/19/2016, 3:10 PM   Time spent 31 minutes

## 2016-03-22 ENCOUNTER — Emergency Department (HOSPITAL_COMMUNITY): Payer: Medicare Other

## 2016-03-22 ENCOUNTER — Emergency Department (HOSPITAL_COMMUNITY)
Admission: EM | Admit: 2016-03-22 | Discharge: 2016-03-22 | Disposition: A | Discharge status: Home / Self Care | Payer: Medicare Other | Attending: Emergency Medicine | Admitting: Emergency Medicine

## 2016-03-22 ENCOUNTER — Encounter (HOSPITAL_COMMUNITY): Payer: Self-pay | Admitting: Emergency Medicine

## 2016-03-22 ENCOUNTER — Non-Acute Institutional Stay (EMERGENCY_DEPARTMENT_HOSPITAL)
Admission: AD | Admit: 2016-03-22 | Discharge: 2016-03-22 | Disposition: A | Discharge status: Home / Self Care | Payer: Medicare Other | Source: Ambulatory Visit | Attending: Internal Medicine | Admitting: Internal Medicine

## 2016-03-22 ENCOUNTER — Encounter (HOSPITAL_COMMUNITY): Payer: Self-pay | Admitting: *Deleted

## 2016-03-22 DIAGNOSIS — Z87891 Personal history of nicotine dependence: Secondary | ICD-10-CM | POA: Insufficient documentation

## 2016-03-22 DIAGNOSIS — Z96641 Presence of right artificial hip joint: Secondary | ICD-10-CM | POA: Diagnosis not present

## 2016-03-22 DIAGNOSIS — Z7982 Long term (current) use of aspirin: Secondary | ICD-10-CM | POA: Diagnosis not present

## 2016-03-22 DIAGNOSIS — Z79899 Other long term (current) drug therapy: Secondary | ICD-10-CM | POA: Diagnosis not present

## 2016-03-22 DIAGNOSIS — D5700 Hb-SS disease with crisis, unspecified: Secondary | ICD-10-CM

## 2016-03-22 DIAGNOSIS — R0781 Pleurodynia: Secondary | ICD-10-CM | POA: Diagnosis not present

## 2016-03-22 DIAGNOSIS — I10 Essential (primary) hypertension: Secondary | ICD-10-CM | POA: Diagnosis not present

## 2016-03-22 DIAGNOSIS — Z8673 Personal history of transient ischemic attack (TIA), and cerebral infarction without residual deficits: Secondary | ICD-10-CM | POA: Insufficient documentation

## 2016-03-22 DIAGNOSIS — Z7901 Long term (current) use of anticoagulants: Secondary | ICD-10-CM | POA: Diagnosis not present

## 2016-03-22 LAB — CBC WITH DIFFERENTIAL/PLATELET
Basophils Absolute: 0 10*3/uL (ref 0.0–0.1)
Basophils Relative: 0 %
EOS ABS: 0.2 10*3/uL (ref 0.0–0.7)
Eosinophils Relative: 1 %
HCT: 22.2 % — ABNORMAL LOW (ref 39.0–52.0)
Hemoglobin: 7.7 g/dL — ABNORMAL LOW (ref 13.0–17.0)
LYMPHS ABS: 2.1 10*3/uL (ref 0.7–4.0)
Lymphocytes Relative: 14 %
MCH: 29.1 pg (ref 26.0–34.0)
MCHC: 34.7 g/dL (ref 30.0–36.0)
MCV: 83.8 fL (ref 78.0–100.0)
MONOS PCT: 12 %
Monocytes Absolute: 1.8 10*3/uL — ABNORMAL HIGH (ref 0.1–1.0)
NEUTROS ABS: 11 10*3/uL — AB (ref 1.7–7.7)
NRBC: 4 /100{WBCs} — AB
Neutrophils Relative %: 73 %
PLATELETS: 501 10*3/uL — AB (ref 150–400)
RBC: 2.65 MIL/uL — ABNORMAL LOW (ref 4.22–5.81)
RDW: 22.4 % — ABNORMAL HIGH (ref 11.5–15.5)
WBC: 15.1 10*3/uL — ABNORMAL HIGH (ref 4.0–10.5)

## 2016-03-22 LAB — BASIC METABOLIC PANEL
Anion gap: 6 (ref 5–15)
BUN: 10 mg/dL (ref 6–20)
CHLORIDE: 107 mmol/L (ref 101–111)
CO2: 24 mmol/L (ref 22–32)
Calcium: 9.6 mg/dL (ref 8.9–10.3)
Creatinine, Ser: 0.77 mg/dL (ref 0.61–1.24)
GFR calc Af Amer: >60 mL/min (ref >60)
GFR calc non Af Amer: >60 mL/min (ref >60)
GLUCOSE: 111 mg/dL — AB (ref 65–99)
POTASSIUM: 3.7 mmol/L (ref 3.5–5.1)
Sodium: 137 mmol/L (ref 135–145)

## 2016-03-22 LAB — RETICULOCYTES
RBC.: 2.65 MIL/uL — ABNORMAL LOW (ref 4.22–5.81)
Retic Count, Absolute: 227.9 10*3/uL — ABNORMAL HIGH (ref 19.0–186.0)
Retic Ct Pct: 8.6 % — ABNORMAL HIGH (ref 0.4–3.1)

## 2016-03-22 MED ORDER — SODIUM CHLORIDE 0.9 % IV SOLN
25.0000 mg | INTRAVENOUS | Status: DC | PRN
  Filled 2016-03-22: qty 0.5

## 2016-03-22 MED ORDER — SODIUM CHLORIDE 0.9% FLUSH: 9.0000 mL | INTRAVENOUS | Status: DC | PRN

## 2016-03-22 MED ORDER — HYDROMORPHONE HCL 2 MG/ML IJ SOLN
2.0000 mg | INTRAMUSCULAR | Status: AC
  Administered 2016-03-22: 2 mg via INTRAVENOUS
  Filled 2016-03-22: qty 1

## 2016-03-22 MED ORDER — HYDROMORPHONE HCL 2 MG/ML IJ SOLN
2.0000 mg | INTRAMUSCULAR | Status: AC
  Filled 2016-03-22: qty 1

## 2016-03-22 MED ORDER — KETOROLAC TROMETHAMINE 30 MG/ML IJ SOLN
30.0000 mg | INTRAMUSCULAR | Status: AC
  Administered 2016-03-22: 30 mg via INTRAVENOUS
  Filled 2016-03-22: qty 1

## 2016-03-22 MED ORDER — NALOXONE HCL 0.4 MG/ML IJ SOLN: 0.4000 mg | INTRAMUSCULAR | Status: DC | PRN

## 2016-03-22 MED ORDER — HEPARIN SOD (PORK) LOCK FLUSH 100 UNIT/ML IV SOLN
500.0000 [IU] | INTRAVENOUS | Status: AC | PRN
  Administered 2016-03-22: 500 [IU]
  Filled 2016-03-22: qty 5

## 2016-03-22 MED ORDER — HYDROMORPHONE HCL 2 MG/ML IJ SOLN
2.0000 mg | INTRAMUSCULAR | Status: AC
  Administered 2016-03-22: 2 mg via INTRAVENOUS

## 2016-03-22 MED ORDER — HYDROMORPHONE HCL 2 MG/ML IJ SOLN: 2.0000 mg | INTRAMUSCULAR | Status: DC | PRN

## 2016-03-22 MED ORDER — SENNOSIDES-DOCUSATE SODIUM 8.6-50 MG PO TABS
1.0000 | ORAL_TABLET | Freq: Two times a day (BID) | ORAL | Status: DC
  Administered 2016-03-22: 1 via ORAL
  Filled 2016-03-22: qty 1

## 2016-03-22 MED ORDER — HYDROMORPHONE HCL 2 MG/ML IJ SOLN: 2.0000 mg | INTRAMUSCULAR | Status: DC

## 2016-03-22 MED ORDER — ONDANSETRON HCL 4 MG/2ML IJ SOLN: 4.0000 mg | INTRAMUSCULAR | Status: DC | PRN

## 2016-03-22 MED ORDER — DIPHENHYDRAMINE HCL 25 MG PO CAPS: 25.0000 mg | ORAL_CAPSULE | ORAL | Status: DC | PRN

## 2016-03-22 MED ORDER — DEXTROSE-NACL 5-0.45 % IV SOLN
INTRAVENOUS | Status: DC
  Administered 2016-03-22: 09:00:00 via INTRAVENOUS

## 2016-03-22 MED ORDER — HYDROMORPHONE HCL 2 MG/ML IJ SOLN: 2.0000 mg | INTRAMUSCULAR | Status: AC

## 2016-03-22 MED ORDER — KETOROLAC TROMETHAMINE 30 MG/ML IJ SOLN
30.0000 mg | Freq: Four times a day (QID) | INTRAMUSCULAR | Status: DC
  Administered 2016-03-22: 30 mg via INTRAVENOUS
  Filled 2016-03-22: qty 1

## 2016-03-22 MED ORDER — POLYETHYLENE GLYCOL 3350 17 G PO PACK: 17.0000 g | PACK | Freq: Every day | ORAL | Status: DC | PRN

## 2016-03-22 MED ORDER — DIPHENHYDRAMINE HCL 50 MG/ML IJ SOLN
25.0000 mg | Freq: Once | INTRAMUSCULAR | Status: AC
  Administered 2016-03-22: 25 mg via INTRAVENOUS
  Filled 2016-03-22: qty 1

## 2016-03-22 MED ORDER — SODIUM CHLORIDE 0.45 % IV SOLN
INTRAVENOUS | Status: DC
  Administered 2016-03-22: 11:00:00 via INTRAVENOUS

## 2016-03-22 MED ORDER — SODIUM CHLORIDE 0.9% FLUSH
10.0000 mL | INTRAVENOUS | Status: AC | PRN
  Administered 2016-03-22: 10 mL

## 2016-03-22 MED ORDER — ONDANSETRON HCL 4 MG/2ML IJ SOLN: 4.0000 mg | Freq: Four times a day (QID) | INTRAMUSCULAR | Status: DC | PRN

## 2016-03-22 MED ORDER — HYDROMORPHONE 1 MG/ML IV SOLN
INTRAVENOUS | Status: DC
  Administered 2016-03-22: 18.9 mg via INTRAVENOUS
  Administered 2016-03-22: 11:00:00 via INTRAVENOUS
  Filled 2016-03-22: qty 25

## 2016-03-22 NOTE — Discharge Instructions (Signed)
Please go to [facility] tomorrow to pick up your portable Oxygen for use all the time.  Take all your medications as prescribed

## 2016-03-22 NOTE — ED Provider Notes (Signed)
[facility] DEPT Provider Note   CSN: [id number] Arrival date & time: 03/22/16  0533     History   Chief Complaint Chief Complaint  Patient presents with  . Sickle Cell Pain Crisis    HPI [name] is a 36 y.o. male.  Patient is a 35 mL male with a history of sickle cell disease, PE and prior acute chest syndrome. He is on Xarelto and states that he is taking it regularly. He also is oxygen dependent. He is on home O2 at 2 L/m. He states he woke up about 3:00 this morning with chest pain which is bilateral and pain in his arms and legs. He states that this is typical for his sickle cell pain crises. He denies any shortness of breath. No cough or chest congestion. No known fevers. No nausea or vomiting. He took his regular home pain medications this morning without improvement in symptoms.      Past Medical History:  Diagnosis Date  . Acute chest syndrome ([place]) 06/18/2013  . Acute embolism and thrombosis of right internal jugular vein ([place])   . Alcohol consumption of one to four drinks per day   . Avascular necrosis (HCC)    Right Hip  . Blood transfusion   . Chronic anticoagulation   . Demand ischemia ([place]) 01/02/2014  . Former smoker   . Functional asplenia   . Hb-SS disease with crisis ([place])   . History of Clostridium difficile infection   . History of pulmonary embolus (PE)   . Hypertension   . Hypokalemia   . Leukocytosis    Chronic  . Mood disorder ([place])   . Noncompliance with medication regimen   . Oxygen deficiency   . Pulmonary hypertension   . Second hand tobacco smoke exposure   . Sickle cell anemia (HCC)   . Sickle-cell crisis with associated acute chest syndrome ([place]) 05/13/2013  . Stroke ([place])   . Thrombocytosis (HCC)    Chronic  . Uses marijuana     Patient Active Problem List   Diagnosis Date Noted  . Chest pain 03/13/2016  . Hypoxia 03/06/2016  . Slurred speech 11/10/2015  . Ischemic stroke ([place])   . Anemia 01/31/2015  . Chronic  anemia   . Chest pain syndrome   . Hb-SS disease without crisis ([place]) 10/07/2014  . Acute on chronic respiratory failure with hypoxia ([place])   . Abnormal EKG 09/10/2014  . Troponin level elevated 08/29/2014  . Cor pulmonale, chronic ([place]) 08/25/2014  . Sickle cell anemia ([place]) 06/25/2014  . Anemia of chronic disease 06/25/2014  . PAH (pulmonary artery hypertension) 03/18/2014  . Chronic respiratory failure with hypoxia ([place]) 03/14/2014  . Paralytic strabismus, external ophthalmoplegia   . Chronic pain syndrome 12/12/2013  . Chronic anticoagulation 08/22/2013  . Essential hypertension 08/22/2013  . Pulmonary HTN 06/18/2013  . Functional asplenia   . Vitamin D deficiency 02/13/2013  . Hx of pulmonary embolus 06/29/2012  . Hemochromatosis 12/14/2011  . Avascular necrosis [facility])     Past Surgical History:  Procedure Laterality Date  . CHOLECYSTECTOMY     01/2008  . Excision of left periauricular cyst     10/2009  . Excision of right ear lobe cyst with primary closur     11/2007  . Porta cath placement    . Porta cath removal    . PORTACATH PLACEMENT  01/05/2012   Procedure: INSERTION PORT-A-CATH;  Surgeon: Odis Hollingshead, MD;  Location: [facility];  Service: General;  Laterality:  N/A;  ultrasound guiced port a cath insertion with fluoroscopy  . Right hip replacement     08/2006  . UMBILICAL HERNIA REPAIR     01/2008       Home Medications    Prior to Admission medications   Medication Sig Start Date End Date Taking? Authorizing Provider  ambrisentan (LETAIRIS) 5 MG tablet Take 5 mg by mouth daily.     Historical Provider, MD  aspirin 81 MG chewable tablet Chew 1 tablet (81 mg total) by mouth daily. 07/23/14   [name], MD  carvedilol (COREG) 3.125 MG tablet Take 1 tablet (3.125 mg total) by mouth 2 (two) times daily with a meal. 03/17/16   [name], MD  cholecalciferol (VITAMIN D) 1000 units tablet Take 2,000 Units by mouth daily.    Historical Provider, MD   Deferasirox (JADENU) 360 MG TABS Take 1,080 mg by mouth daily.     Historical Provider, MD  folic acid (FOLVITE) 1 MG tablet Take 1 mg by mouth daily.    Historical Provider, MD  gabapentin (NEURONTIN) 300 MG capsule Take 1 capsule (300 mg total) by mouth 3 (three) times daily. 06/01/15   Tresa Garter, MD  HYDROmorphone (DILAUDID) 4 MG tablet Take 1 tablet (4 mg total) by mouth every 4 (four) hours as needed for moderate pain or severe pain. 03/08/16 03/23/16  Tresa Garter, MD  hydroxyurea (HYDREA) 500 MG capsule Take 2 capsules (1,000 mg total) by mouth daily. May take with food to minimize GI side effects. 03/17/16   [name], MD  morphine (MS CONTIN) 30 MG 12 hr tablet Take 1 tablet (30 mg total) by mouth every 12 (twelve) hours. 03/08/16 04/07/16  Tresa Garter, MD  rivaroxaban (XARELTO) 20 MG TABS tablet Take 1 tablet (20 mg total) by mouth at bedtime. 12/29/15   Tresa Garter, MD  zolpidem (AMBIEN) 10 MG tablet Take 1 tablet (10 mg total) by mouth at bedtime as needed for sleep. 03/08/16 04/07/16  Tresa Garter, MD    Family History Family History  Problem Relation Age of Onset  . Sickle cell trait Mother   . Depression Mother   . Diabetes Mother   . Sickle cell trait Father   . Sickle cell trait Brother     Social History Social History  Substance Use Topics  . Smoking status: Former Smoker    Packs/day: 0.50    Years: 10.00    Types: Cigarettes    Quit date: 05/29/2011  . Smokeless tobacco: Never Used  . Alcohol use No     Allergies   Patient has no known allergies.   Review of Systems Review of Systems  Constitutional: Negative for chills, diaphoresis, fatigue and fever.  HENT: Negative for congestion, rhinorrhea and sneezing.   Eyes: Negative.   Respiratory: Negative for cough, chest tightness and shortness of breath.   Cardiovascular: Positive for chest pain. Negative for leg swelling.  Gastrointestinal: Negative for abdominal pain,  blood in stool, diarrhea, nausea and vomiting.  Genitourinary: Negative for difficulty urinating, flank pain, frequency and hematuria.  Musculoskeletal: Positive for arthralgias. Negative for back pain.  Skin: Negative for rash.  Neurological: Negative for dizziness, speech difficulty, weakness, numbness and headaches.     Physical Exam Updated Vital Signs BP 106/78 (BP Location: Right Arm)   Pulse 73   Temp 98.8 F (37.1 C) (Oral)   Resp 16   Ht 6' (1.829 m)   Wt 180 lb (  81.6 kg)   SpO2 96%   BMI 24.41 kg/m   Physical Exam  Constitutional: He is oriented to person, place, and time. He appears well-developed and well-nourished.  HENT:  Head: Normocephalic and atraumatic.  Eyes: Pupils are equal, round, and reactive to light.  Neck: Normal range of motion. Neck supple.  Cardiovascular: Normal rate, regular rhythm and normal heart sounds.   Pulmonary/Chest: Effort normal and breath sounds normal. No respiratory distress. He has no wheezes. He has no rales. He exhibits no tenderness.  Abdominal: Soft. Bowel sounds are normal. There is no tenderness. There is no rebound and no guarding.  Musculoskeletal: Normal range of motion. He exhibits no edema.  No joint swelling or erythema  Lymphadenopathy:    He has no cervical adenopathy.  Neurological: He is alert and oriented to person, place, and time.  Skin: Skin is warm and dry. No rash noted.  Psychiatric: He has a normal mood and affect.     ED Treatments / Results  Labs (all labs ordered are listed, but only abnormal results are displayed) Labs Reviewed  CBC WITH DIFFERENTIAL/PLATELET - Abnormal; Notable for the following:       Result Value   WBC 15.1 (*)    RBC 2.65 (*)    Hemoglobin 7.7 (*)    HCT 22.2 (*)    RDW 22.4 (*)    Platelets 501 (*)    All other components within normal limits  BASIC METABOLIC PANEL - Abnormal; Notable for the following:    Glucose, Bld 111 (*)    All other components within normal  limits  RETICULOCYTES - Abnormal; Notable for the following:    Retic Ct Pct 8.6 (*)    RBC. 2.65 (*)    Retic Count, Manual 227.9 (*)    All other components within normal limits    EKG  EKG Interpretation None       Radiology Dg Chest 2 View  Result Date: 03/22/2016 CLINICAL DATA:  Chest/bilateral rib pain since last night. Sickle cell crisis. EXAM: CHEST  2 VIEW COMPARISON:  Chest CT 03/13/2016 and radiographs 03/11/2016 FINDINGS: Left subclavian Port-A-Cath remains in place, with tip overlying the lower SVC. Cardiac silhouette remains moderately enlarged, unchanged. Mild diffuse chronic interstitial accentuation is unchanged. Focal scarring is noted in the right mid lung. There is no evidence of acute airspace consolidation, pulmonary edema, pleural effusion, or pneumothorax. No acute osseous abnormality is seen. IMPRESSION: No active cardiopulmonary disease. Electronically Signed   By: Logan Bores M.D.   On: 03/22/2016 08:18    Procedures Procedures (including critical care time)  Medications Ordered in ED Medications  dextrose 5 %-0.45 % sodium chloride infusion ( Intravenous New Bag/Given 03/22/16 0833)  HYDROmorphone (DILAUDID) injection 2 mg (2 mg Intravenous Not Given 03/22/16 0919)    Or  HYDROmorphone (DILAUDID) injection 2 mg ( Subcutaneous See Alternative 03/22/16 0919)  HYDROmorphone (DILAUDID) injection 2 mg (not administered)    Or  HYDROmorphone (DILAUDID) injection 2 mg (not administered)  ondansetron (ZOFRAN) injection 4 mg (not administered)  ketorolac (TORADOL) 30 MG/ML injection 30 mg (30 mg Intravenous Given 03/22/16 0835)  HYDROmorphone (DILAUDID) injection 2 mg (2 mg Intravenous Given 03/22/16 0835)    Or  HYDROmorphone (DILAUDID) injection 2 mg ( Subcutaneous See Alternative 03/22/16 0835)  HYDROmorphone (DILAUDID) injection 2 mg (2 mg Intravenous Given 03/22/16 0921)    Or  HYDROmorphone (DILAUDID) injection 2 mg ( Subcutaneous See Alternative 03/22/16  0921)  diphenhydrAMINE (BENADRYL) injection 25 mg (  25 mg Intravenous Given 03/22/16 0835)     Initial Impression / Assessment and Plan / ED Course  I have reviewed the triage vital signs and the nursing notes.  Pertinent labs & imaging results that were available during my care of the patient were reviewed by me and considered in my medical decision making (see chart for details).     Patient presents with pain related to his sickle cell disease. He denies any shortness of breath. His chest pain is similar to his prior pain flareups. There is no evidence of acute chest syndrome. He's on nasal cannula which she uses at home. He's not requiring more than his baseline oxygen. His labs are similar to baseline values. I spoke with the nurse practitioner, [place] Hollis at the sickle cell clinic who is accepted the patient over there. Patient was discharged here with his port access in place and transported the sickle cell clinic. His pain has improved with Dilaudid but it's still 7 out of 10.  Final Clinical Impressions(s) / ED Diagnoses   Final diagnoses:  Sickle cell pain crisis [facility])    New Prescriptions New Prescriptions   No medications on file     Malvin Johns, MD 03/22/16 [phone]

## 2016-03-22 NOTE — H&P (Signed)
[facility] History and Physical  [name] [id number] DOB: [birth year] DOA: 03/22/2016  PCP: [name], MD   Chief Complaint: Bilateral Rib Pain  HPI: [name] is a 36 y.o. male with history of sickle cell disease, with frequent crises and multiple complications including PEs and ACS, recently discharged from the hospital, was in the ED this morning complaining of bilateral rib pains similar to his sickle cell pain crisis. He was managed briefly and transitioned to the day hospital for extended observation and pain management. He denies SOB, denies nausea/vomiting/diarrhea. No cough, no congestion, no fever, no urinary symptom. He has not picked up his mobile oxygen tank from [facility], not adherent with his supplemental home oxygen use. At baseline, his oxygen saturation is in the low 90s, today he is saturating in the 90s on room air, not requiring more oxygen to maintain his saturation.   Systemic Review: General: The patient denies anorexia, fever, weight loss Cardiac: Denies chest pain, syncope, palpitations, pedal edema  Respiratory: Denies cough, shortness of breath, wheezing GI: Denies severe indigestion/heartburn, abdominal pain, nausea, vomiting, diarrhea and constipation GU: Denies hematuria, incontinence, dysuria  Musculoskeletal: Denies arthritis  Skin: Denies suspicious skin lesions Neurologic: Denies focal weakness or numbness, change in vision  Past Medical History:  Diagnosis Date  . Acute chest syndrome ([place]) 06/18/2013  . Acute embolism and thrombosis of right internal jugular vein ([place])   . Alcohol consumption of one to four drinks per day   . Avascular necrosis (HCC)    Right Hip  . Blood transfusion   . Chronic anticoagulation   . Demand ischemia ([place]) 01/02/2014  . Former smoker   . Functional asplenia   . Hb-SS disease with crisis ([place])   . History of Clostridium difficile infection   . History of pulmonary  embolus (PE)   . Hypertension   . Hypokalemia   . Leukocytosis    Chronic  . Mood disorder ([place])   . Noncompliance with medication regimen   . Oxygen deficiency   . Pulmonary hypertension   . Second hand tobacco smoke exposure   . Sickle cell anemia (HCC)   . Sickle-cell crisis with associated acute chest syndrome ([place]) 05/13/2013  . Stroke ([place])   . Thrombocytosis (HCC)    Chronic  . Uses marijuana     Past Surgical History:  Procedure Laterality Date  . CHOLECYSTECTOMY     01/2008  . Excision of left periauricular cyst     10/2009  . Excision of right ear lobe cyst with primary closur     11/2007  . Porta cath placement    . Porta cath removal    . PORTACATH PLACEMENT  01/05/2012   Procedure: INSERTION PORT-A-CATH;  Surgeon: Odis Hollingshead, MD;  Location: [facility];  Service: General;  Laterality: N/A;  ultrasound guiced port a cath insertion with fluoroscopy  . Right hip replacement     08/2006  . UMBILICAL HERNIA REPAIR     01/2008    No Known Allergies  Family History  Problem Relation Age of Onset  . Sickle cell trait Mother   . Depression Mother   . Diabetes Mother   . Sickle cell trait Father   . Sickle cell trait Brother       Prior to Admission medications   Medication Sig Start Date End Date Taking? Authorizing Provider  ambrisentan (LETAIRIS) 5 MG tablet Take 5 mg by mouth daily.     Historical  Provider, MD  aspirin 81 MG chewable tablet Chew 1 tablet (81 mg total) by mouth daily. 07/23/14   [name], MD  carvedilol (COREG) 3.125 MG tablet Take 1 tablet (3.125 mg total) by mouth 2 (two) times daily with a meal. 03/17/16   [name], MD  cholecalciferol (VITAMIN D) 1000 units tablet Take 2,000 Units by mouth daily.    Historical Provider, MD  Deferasirox (JADENU) 360 MG TABS Take 1,080 mg by mouth daily.     Historical Provider, MD  folic acid (FOLVITE) 1 MG tablet Take 1 mg by mouth daily.    Historical Provider, MD  gabapentin  (NEURONTIN) 300 MG capsule Take 1 capsule (300 mg total) by mouth 3 (three) times daily. 06/01/15   Tresa Garter, MD  HYDROmorphone (DILAUDID) 4 MG tablet Take 1 tablet (4 mg total) by mouth every 4 (four) hours as needed for moderate pain or severe pain. 03/08/16 03/23/16  Tresa Garter, MD  hydroxyurea (HYDREA) 500 MG capsule Take 2 capsules (1,000 mg total) by mouth daily. May take with food to minimize GI side effects. 03/17/16   [name], MD  morphine (MS CONTIN) 30 MG 12 hr tablet Take 1 tablet (30 mg total) by mouth every 12 (twelve) hours. 03/08/16 04/07/16  Tresa Garter, MD  rivaroxaban (XARELTO) 20 MG TABS tablet Take 1 tablet (20 mg total) by mouth at bedtime. 12/29/15   Tresa Garter, MD  zolpidem (AMBIEN) 10 MG tablet Take 1 tablet (10 mg total) by mouth at bedtime as needed for sleep. 03/08/16 04/07/16  Tresa Garter, MD     Physical Exam: There were no vitals filed for this visit.  General: Alert, awake, afebrile, anicteric, not in obvious distress HEENT: Normocephalic and Atraumatic, Mucous membranes pink                PERRLA; EOM intact; No scleral icterus,                 Nares: Patent, Oropharynx: Clear, Fair Dentition                 Neck: FROM, no cervical lymphadenopathy, thyromegaly, carotid bruit or JVD;  CHEST WALL: No tenderness  CHEST: Normal respiration, clear to auscultation bilaterally  HEART: Regular rate and rhythm; no murmurs rubs or gallops  BACK: No kyphosis or scoliosis; no CVA tenderness  ABDOMEN: Positive Bowel Sounds, soft, non-tender; no masses, no organomegaly EXTREMITIES: No cyanosis, clubbing, or edema SKIN:  no rash or ulceration  CNS: Alert and Oriented x 4, Nonfocal exam, CN 2-12 intact  Labs on Admission:  Basic Metabolic Panel:  Recent Labs Lab 03/16/16 0547 03/17/16 1205 03/18/16 0359 03/22/16 0826  NA 139 140 139 137  K 4.9 5.6* 4.8 3.7  CL 112* 111 108 107  CO2 23 26 27 24   GLUCOSE 117* 110* 134*  111*  BUN 14 13 17 10   CREATININE 0.88 0.83 1.04 0.77  CALCIUM 8.8* 8.8* 8.9 9.6   Liver Function Tests: No results for input(s): AST, ALT, ALKPHOS, BILITOT, PROT, ALBUMIN in the last 168 hours. No results for input(s): LIPASE, AMYLASE in the last 168 hours. No results for input(s): AMMONIA in the last 168 hours. CBC:  Recent Labs Lab 03/15/16 1123 03/16/16 0547 03/17/16 1205 03/22/16 0826  WBC 11.7* 12.2* 10.8* 15.1*  NEUTROABS 8.2* 7.1 7.0 11.0*  HGB 7.1* 6.9* 7.4* 7.7*  HCT 21.0* 20.1* 21.4* 22.2*  MCV 92.1 91.8 85.9 83.8  PLT 495* 483* 487* 501*   Cardiac Enzymes:  Recent Labs Lab 03/15/16 1431 03/15/16 2003 03/16/16 0159 03/16/16 0802 03/16/16 1449  TROPONINI 0.17* 0.20* 0.20* 0.17* 0.15*    BNP (last 3 results)  Recent Labs  11/28/15 0520 02/11/16 1006 03/07/16 0535  BNP 422.5* 381.0* 319.6*    ProBNP (last 3 results) No results for input(s): PROBNP in the last 8760 hours.  CBG: No results for input(s): GLUCAP in the last 168 hours.   Assessment/Plan Active Problems:   Sickle cell anemia with crisis ([place])   Admits to the Day Hospital  IVF .45% Saline @ 75 mls/hour (Patient is sensitive to IVF overload)  Weight based Dilaudid PCA started within 30 minutes of admission  IV Toradol 30 mg Q 6 H  Monitor vitals very closely, Re-evaluate pain scale every hour  2 L of Oxygen by [place]  Patient will be re-evaluated for pain in the context of function and relationship to baseline as care progresses.  If no significant relieve from pain (remains above 5/10) will transfer patient to inpatient services for further evaluation and management  Code Status: Full  Family Communication: None  DVT Prophylaxis: Ambulate as tolerated   Time spent: 85 Minutes  Jandy Brackens, MD, MHA, FACP, FAAP, CPE  If 7PM-7AM, please contact night-coverage [email or web address] 03/22/2016, 9:59 AM

## 2016-03-22 NOTE — Discharge Summary (Signed)
Physician Discharge Summary  [name] [id number] DOB: [birth year] DOA: 03/22/2016  PCP: [name], MD  Admit date: 03/22/2016  Discharge date: 03/22/2016  Time spent: 30 minutes  Discharge Diagnoses:  Active Problems:   Sickle cell anemia with crisis [facility])   Discharge Condition: Stable  Diet recommendation: Regular  Filed Weights   03/22/16 1021  Weight: 175 lb (79.4 kg)    History of present illness:  [name] is a 36 y.o. male with history of sickle cell disease, with frequent crises and multiple complications including PEs and ACS, recently discharged from the hospital, was in the ED this morning complaining of bilateral rib pains similar to his sickle cell pain crisis. He was managed briefly and transitioned to the day hospital for extended observation and pain management. He denies SOB, denies nausea/vomiting/diarrhea. No cough, no congestion, no fever, no urinary symptom. He has not picked up his mobile oxygen tank from [facility], not adherent with his supplemental home oxygen use. At baseline, his oxygen saturation is in the low 90s, today he is saturating in the 90s on room air, not requiring more oxygen to maintain his saturation.   Hospital Course:  [name] was admitted to the day hospital with sickle cell painful crisis. Patient was treated with weight based IV Dilaudid PCA, IV Toradol as well as IV fluids. [name] showed significant improvement symptomatically, pain improved from 8 to 4/10 at the time of discharge. Patient was discharged home in a hemodynamically stable condition. [name] will follow-up at the clinic as previously scheduled, continue with home medications as per prior to admission. He was instructed to go to [facility] to pick up his portable oxygen tank. We will schedule sleep study at his next follow up appointment.  Discharge Instructions We discussed the need for good hydration, monitoring of hydration status,  avoidance of heat, cold, stress, and infection triggers. We discussed the need to be compliant with taking Hydrea. [name] was reminded of the need to seek medical attention of any symptoms of bleeding, anemia, or infection occurs.  Discharge Exam: Vitals:   03/22/16 1431 03/22/16 1600  BP: (!) 104/59 116/73  Pulse: 86 80  Resp: 19 17  Temp:     General appearance: alert, cooperative and no distress Eyes: conjunctivae/corneas clear. PERRL, EOM's intact. Fundi benign. Neck: no adenopathy, no carotid bruit, no JVD, supple, symmetrical, trachea midline and thyroid not enlarged, symmetric, no tenderness/mass/nodules Back: symmetric, no curvature. ROM normal. No CVA tenderness. Resp: clear to auscultation bilaterally Chest wall: no tenderness Cardio: regular rate and rhythm, S1, S2 normal, no murmur, click, rub or gallop GI: soft, non-tender; bowel sounds normal; no masses, no organomegaly Extremities: extremities normal, atraumatic, no cyanosis or edema Pulses: 2+ and symmetric Skin: Skin color, texture, turgor normal. No rashes or lesions Neurologic: Grossly normal  Discharge Instructions    Diet - low sodium heart healthy    Complete by:  As directed    Increase activity slowly    Complete by:  As directed      Current Discharge Medication List    CONTINUE these medications which have NOT CHANGED   Details  ambrisentan (LETAIRIS) 5 MG tablet Take 5 mg by mouth daily.     aspirin 81 MG chewable tablet Chew 1 tablet (81 mg total) by mouth daily. Qty: 30 tablet, Refills: 11    carvedilol (COREG) 3.125 MG tablet Take 1 tablet (3.125 mg total) by mouth 2 (two) times daily with a meal. Qty:  30 tablet, Refills: 1    cholecalciferol (VITAMIN D) 1000 units tablet Take 2,000 Units by mouth daily.    Deferasirox (JADENU) 360 MG TABS Take 1,080 mg by mouth daily.     folic acid (FOLVITE) 1 MG tablet Take 1 mg by mouth daily.    gabapentin (NEURONTIN) 300 MG capsule Take 1 capsule  (300 mg total) by mouth 3 (three) times daily. Qty: 270 capsule, Refills: 3    HYDROmorphone (DILAUDID) 4 MG tablet Take 1 tablet (4 mg total) by mouth every 4 (four) hours as needed for moderate pain or severe pain. Qty: 90 tablet, Refills: 0    hydroxyurea (HYDREA) 500 MG capsule Take 2 capsules (1,000 mg total) by mouth daily. May take with food to minimize GI side effects.    morphine (MS CONTIN) 30 MG 12 hr tablet Take 1 tablet (30 mg total) by mouth every 12 (twelve) hours. Qty: 60 tablet, Refills: 0    rivaroxaban (XARELTO) 20 MG TABS tablet Take 1 tablet (20 mg total) by mouth at bedtime. Qty: 30 tablet, Refills: 3    zolpidem (AMBIEN) 10 MG tablet Take 1 tablet (10 mg total) by mouth at bedtime as needed for sleep. Qty: 30 tablet, Refills: 0   Associated Diagnoses: Primary insomnia       No Known Allergies  Significant Diagnostic Studies: Dg Chest 2 View  Result Date: 03/22/2016 CLINICAL DATA:  Chest/bilateral rib pain since last night. Sickle cell crisis. EXAM: CHEST  2 VIEW COMPARISON:  Chest CT 03/13/2016 and radiographs 03/11/2016 FINDINGS: Left subclavian Port-A-Cath remains in place, with tip overlying the lower SVC. Cardiac silhouette remains moderately enlarged, unchanged. Mild diffuse chronic interstitial accentuation is unchanged. Focal scarring is noted in the right mid lung. There is no evidence of acute airspace consolidation, pulmonary edema, pleural effusion, or pneumothorax. No acute osseous abnormality is seen. IMPRESSION: No active cardiopulmonary disease. Electronically Signed   By: Logan Bores M.D.   On: 03/22/2016 08:18   Dg Chest 2 View  Result Date: 03/11/2016 CLINICAL DATA:  Sickle cell pain crisis with bilateral chest pain beginning this morning. EXAM: CHEST  2 VIEW COMPARISON:  03/06/2016 FINDINGS: Moderate cardiomegaly remains stable. Left-sided power port remains in appropriate position. Mild scarring and peripheral right midlung is unchanged. No  evidence of acute pulmonary infiltrate or edema. No evidence of pleural effusion. IMPRESSION: Stable cardiomegaly.  No active lung disease. Electronically Signed   By: Earle Gell M.D.   On: 03/11/2016 08:55   Dg Chest 2 View  Result Date: 03/06/2016 CLINICAL DATA:  36 year old male with right rib pain and sickle cell crisis. Initial encounter. EXAM: CHEST  2 VIEW COMPARISON:  02/29/2016 and earlier. FINDINGS: Stable cardiomegaly and mediastinal contours. Stable left chest power port, currently accessed. Interval resolved patchy and nodular right upper lung opacity. No pneumothorax, pulmonary edema, pleural effusion or acute pulmonary opacity. Stable basilar predominant mild chronic increased interstitial markings. Negative visible bowel gas pattern. Stable cholecystectomy clips. No acute osseous abnormality identified. IMPRESSION: Interval resolved right upper lobe opacity. Stable cardiomegaly with no acute cardiopulmonary abnormality. Electronically Signed   By: Genevie Ann M.D.   On: 03/06/2016 10:46   Dg Chest 2 View  Result Date: 02/29/2016 CLINICAL DATA:  Hypoxia and weakness.  Sickle cell. EXAM: CHEST  2 VIEW COMPARISON:  02/23/2016. FINDINGS: Trachea is midline. Left subclavian power port tip is in the high right atrium. Heart is enlarged, stable. Mild patchy right upper lobe airspace opacification. Streaky atelectasis or scarring  in the left lower lobe. No pleural fluid. IMPRESSION: Mild right upper lobe patchy airspace opacification. Electronically Signed   By: Lorin Picket M.D.   On: 02/29/2016 12:53   Dg Chest 2 View  Result Date: 02/23/2016 CLINICAL DATA:  SICKLE CELL CRISIS EXAM: CHEST  2 VIEW COMPARISON:  CT 02/21/2016 FINDINGS: LEFT-SIDED POWER PORT UNCHANGED. STABLE CARDIAC SILHOUETTE. THERE IS MILD CENTRAL VENOUS CONGESTION. NO FOCAL INFILTRATE. NO PLEURAL FLUID. NO PNEUMOTHORAX. IMPRESSION: 1. STABLE CARDIOMEGALY. 2. STABLE CENTRAL VENOUS PULMONARY CONGESTION. 3. NO EVIDENCE OF PULMONARY  INFILTRATE Electronically Signed   By: Suzy Bouchard M.D.   On: 02/23/2016 16:35   Ct Angio Chest Pe W And/or Wo Contrast  Result Date: 03/13/2016 CLINICAL DATA:  History of sickle cell. Left-sided chest pain starting this morning about 3 a.m. EXAM: CT ANGIOGRAPHY CHEST WITH CONTRAST TECHNIQUE: Multidetector CT imaging of the chest was performed using the standard protocol during bolus administration of intravenous contrast. Multiplanar CT image reconstructions and MIPs were obtained to evaluate the vascular anatomy. CONTRAST:  100 mL Isovue 370 COMPARISON:  CT chest 03/06/2016, 02/21/2016, 01/05/2016 FINDINGS: Cardiovascular: Satisfactory opacification of the pulmonary arteries to the segmental level. No evidence of pulmonary embolism. Stable cardiomegaly. No pericardial effusion. Normal caliber thoracic aorta. No thoracic aortic atherosclerosis or dissection. Left-sided Port-A-Cath again noted. Mediastinum/Nodes: Thyroid gland, trachea, and esophagus demonstrate no significant findings. Multiple small prevascular lymph nodes unchanged compared with the prior exams. Mildly enlarged stable subcarinal lymph node. No axillary lymphadenopathy. Lungs/Pleura: Patchy areas of ground-glass opacities in bilateral upper lobes and lower lobes which may reflect mild pulmonary edema versus alveolitis secondary to an infectious or inflammatory etiology. No pleural effusion or pneumothorax. Upper Abdomen: No acute abnormality in the upper abdomen. Chronic calcified, involuted spleen. Musculoskeletal: No acute osseous abnormality. No lytic or sclerotic osseous lesion. Soft tissue: Bilateral retroareolar fibroglandular soft tissue as can be seen with gynecomastia. Review of the MIP images confirms the above findings. IMPRESSION: 1. No evidence of acute pulmonary embolus. 2. Stable cardiomegaly. 3. Patchy areas of ground-glass opacities in bilateral upper lobes and lower lobes which may reflect mild pulmonary edema versus  alveolitis secondary to an infectious or inflammatory etiology. Electronically Signed   By: Kathreen Devoid   On: 03/13/2016 10:13   Ct Angio Chest Pe W And/or Wo Contrast  Result Date: 03/06/2016 CLINICAL DATA:  Chest pain, new hypoxia.  Sickle cell disease. EXAM: CT ANGIOGRAPHY CHEST WITH CONTRAST TECHNIQUE: Multidetector CT imaging of the chest was performed using the standard protocol during bolus administration of intravenous contrast. Multiplanar CT image reconstructions and MIPs were obtained to evaluate the vascular anatomy. CONTRAST:  100 cc Isovue 370 Additional 80 cc Isovue 370 given due to port leakage and patient motion. COMPARISON:  Chest CT angiogram dated 02/21/2016. FINDINGS: Cardiovascular: Despite repeat imaging, the peripheral segmental and subsegmental pulmonary arteries to the lower lobes are difficult to definitively characterize due to patient motion artifact and suboptimal contrast opacification. There is, however, no pulmonary embolism identified within the main, lobar or central segmental pulmonary arteries bilaterally. Patient has had chronic lower lobe subsegmental pulmonary emboli described on previous exams, not able to be delineated again on today's study due to the patient motion artifact and suboptimal contrast opacification. Thoracic aorta is normal in caliber and configuration. Heart is prominently enlarged, stable compared to the previous exam. No pericardial effusion seen. Mediastinum/Nodes: Numerous small and moderate-sized lymph nodes within the anterior mediastinum are stable. Normal residual thymic tissue is also noted within the anterior  mediastinum. Esophagus appears normal. Trachea and central bronchi are unremarkable. Lungs/Pleura: Mild bibasilar scarring/atelectasis is stable. Lungs otherwise clear. No evidence of pneumonia. No pleural effusion or pneumothorax. Upper Abdomen: Limited images of the upper abdomen are unremarkable. Musculoskeletal: No acute or suspicious  osseous lesion. Superficial soft tissues are unremarkable. Review of the MIP images confirms the above findings. IMPRESSION: 1. No acute pulmonary embolus identified, with mild study limitations detailed above. 2. Stable cardiomegaly. 3. No pneumonia or pulmonary edema. Stable mild scarring/fibrosis at the lung bases. 4. Stable mediastinal lymphadenopathy. Electronically Signed   By: Franki Cabot M.D.   On: 03/06/2016 13:30   Signed:  [name] MD, MHA, FACP, [place], CPE   03/22/2016, 4:11 PM

## 2016-03-22 NOTE — ED Triage Notes (Signed)
Pt report he was awaken from sleep by bilateral rib pain denies SOB

## 2016-03-22 NOTE — Progress Notes (Signed)
Pt received to the [facility] from the ED via wheelchair. Pt alert and oriented. Pt has porta cath already accessed. Pt states his pain is at 8/10 and in his rib cage. Pt was treated with IV fluids, Dilaudid PCA and Toradol. Pt's pain was down to 4/10 at discharge. Pt received d/c instructions with verbal understanding. Pt was alert, oriented and ambulatory at discharge.

## 2016-03-22 NOTE — ED Notes (Signed)
Patient transported to sickle cell clinic by ED staff.

## 2016-03-24 ENCOUNTER — Telehealth (HOSPITAL_COMMUNITY): Payer: Self-pay | Admitting: Internal Medicine

## 2016-03-24 ENCOUNTER — Other Ambulatory Visit: Payer: Self-pay

## 2016-03-24 ENCOUNTER — Encounter (HOSPITAL_COMMUNITY): Payer: Self-pay

## 2016-03-24 ENCOUNTER — Non-Acute Institutional Stay (HOSPITAL_COMMUNITY)
Admission: AD | Admit: 2016-03-24 | Discharge: 2016-03-24 | Disposition: A | Discharge status: Home / Self Care | Payer: Medicare Other | Source: Ambulatory Visit | Attending: Internal Medicine | Admitting: Internal Medicine

## 2016-03-24 DIAGNOSIS — R0781 Pleurodynia: Secondary | ICD-10-CM | POA: Diagnosis present

## 2016-03-24 DIAGNOSIS — J810 Acute pulmonary edema: Secondary | ICD-10-CM | POA: Diagnosis present

## 2016-03-24 DIAGNOSIS — D5700 Hb-SS disease with crisis, unspecified: Secondary | ICD-10-CM | POA: Diagnosis not present

## 2016-03-24 DIAGNOSIS — Z7901 Long term (current) use of anticoagulants: Secondary | ICD-10-CM

## 2016-03-24 DIAGNOSIS — Q8901 Asplenia (congenital): Secondary | ICD-10-CM

## 2016-03-24 DIAGNOSIS — I10 Essential (primary) hypertension: Secondary | ICD-10-CM | POA: Diagnosis present

## 2016-03-24 DIAGNOSIS — Z818 Family history of other mental and behavioral disorders: Secondary | ICD-10-CM | POA: Diagnosis not present

## 2016-03-24 DIAGNOSIS — R4781 Slurred speech: Secondary | ICD-10-CM | POA: Diagnosis not present

## 2016-03-24 DIAGNOSIS — Z79899 Other long term (current) drug therapy: Secondary | ICD-10-CM

## 2016-03-24 DIAGNOSIS — Z79891 Long term (current) use of opiate analgesic: Secondary | ICD-10-CM

## 2016-03-24 DIAGNOSIS — Z87891 Personal history of nicotine dependence: Secondary | ICD-10-CM

## 2016-03-24 DIAGNOSIS — Z7982 Long term (current) use of aspirin: Secondary | ICD-10-CM

## 2016-03-24 DIAGNOSIS — J9621 Acute and chronic respiratory failure with hypoxia: Secondary | ICD-10-CM | POA: Diagnosis present

## 2016-03-24 DIAGNOSIS — D638 Anemia in other chronic diseases classified elsewhere: Secondary | ICD-10-CM | POA: Diagnosis present

## 2016-03-24 DIAGNOSIS — Z8673 Personal history of transient ischemic attack (TIA), and cerebral infarction without residual deficits: Secondary | ICD-10-CM

## 2016-03-24 DIAGNOSIS — Z9114 Patient's other noncompliance with medication regimen: Secondary | ICD-10-CM

## 2016-03-24 DIAGNOSIS — K5900 Constipation, unspecified: Secondary | ICD-10-CM | POA: Diagnosis present

## 2016-03-24 DIAGNOSIS — Z96641 Presence of right artificial hip joint: Secondary | ICD-10-CM

## 2016-03-24 DIAGNOSIS — Z9981 Dependence on supplemental oxygen: Secondary | ICD-10-CM

## 2016-03-24 DIAGNOSIS — Z833 Family history of diabetes mellitus: Secondary | ICD-10-CM | POA: Diagnosis not present

## 2016-03-24 DIAGNOSIS — Z9049 Acquired absence of other specified parts of digestive tract: Secondary | ICD-10-CM | POA: Diagnosis not present

## 2016-03-24 DIAGNOSIS — I2781 Cor pulmonale (chronic): Secondary | ICD-10-CM | POA: Diagnosis present

## 2016-03-24 DIAGNOSIS — Z86711 Personal history of pulmonary embolism: Secondary | ICD-10-CM | POA: Insufficient documentation

## 2016-03-24 DIAGNOSIS — R079 Chest pain, unspecified: Secondary | ICD-10-CM | POA: Diagnosis not present

## 2016-03-24 DIAGNOSIS — Z86718 Personal history of other venous thrombosis and embolism: Secondary | ICD-10-CM | POA: Diagnosis not present

## 2016-03-24 DIAGNOSIS — I2721 Secondary pulmonary arterial hypertension: Secondary | ICD-10-CM | POA: Diagnosis not present

## 2016-03-24 LAB — COMPREHENSIVE METABOLIC PANEL
ALBUMIN: 4.4 g/dL (ref 3.5–5.0)
ALT: 28 U/L (ref 17–63)
ANION GAP: 7 (ref 5–15)
AST: 39 U/L (ref 15–41)
Alkaline Phosphatase: 99 U/L (ref 38–126)
BUN: 11 mg/dL (ref 6–20)
CHLORIDE: 107 mmol/L (ref 101–111)
CO2: 24 mmol/L (ref 22–32)
Calcium: 9.1 mg/dL (ref 8.9–10.3)
Creatinine, Ser: 0.77 mg/dL (ref 0.61–1.24)
GFR calc Af Amer: >60 mL/min (ref >60)
GFR calc non Af Amer: >60 mL/min (ref >60)
GLUCOSE: 104 mg/dL — AB (ref 65–99)
POTASSIUM: 3.5 mmol/L (ref 3.5–5.1)
SODIUM: 138 mmol/L (ref 135–145)
TOTAL PROTEIN: 7.8 g/dL (ref 6.5–8.1)
Total Bilirubin: 5 mg/dL — ABNORMAL HIGH (ref 0.3–1.2)

## 2016-03-24 LAB — HEMOGLOBIN AND HEMATOCRIT, BLOOD
HEMATOCRIT: 22.4 % — AB (ref 39.0–52.0)
HEMOGLOBIN: 7.7 g/dL — AB (ref 13.0–17.0)

## 2016-03-24 MED ORDER — SODIUM CHLORIDE 0.9 % IV SOLN
25.0000 mg | INTRAVENOUS | Status: DC | PRN
  Filled 2016-03-24: qty 0.5

## 2016-03-24 MED ORDER — SODIUM CHLORIDE 0.9% FLUSH: 9.0000 mL | INTRAVENOUS | Status: DC | PRN

## 2016-03-24 MED ORDER — ONDANSETRON HCL 4 MG/2ML IJ SOLN: 4.0000 mg | Freq: Four times a day (QID) | INTRAMUSCULAR | Status: DC | PRN

## 2016-03-24 MED ORDER — DIPHENHYDRAMINE HCL 25 MG PO CAPS: 25.0000 mg | ORAL_CAPSULE | ORAL | Status: DC | PRN

## 2016-03-24 MED ORDER — HEPARIN SOD (PORK) LOCK FLUSH 100 UNIT/ML IV SOLN
500.0000 [IU] | INTRAVENOUS | Status: AC | PRN
  Administered 2016-03-24: 500 [IU]
  Filled 2016-03-24: qty 5

## 2016-03-24 MED ORDER — KETOROLAC TROMETHAMINE 30 MG/ML IJ SOLN
30.0000 mg | Freq: Once | INTRAMUSCULAR | Status: AC
  Administered 2016-03-24: 30 mg via INTRAVENOUS
  Filled 2016-03-24: qty 1

## 2016-03-24 MED ORDER — NALOXONE HCL 0.4 MG/ML IJ SOLN: 0.4000 mg | INTRAMUSCULAR | Status: DC | PRN

## 2016-03-24 MED ORDER — SODIUM CHLORIDE 0.9% FLUSH: 10.0000 mL | INTRAVENOUS | Status: DC | PRN

## 2016-03-24 MED ORDER — DEXTROSE-NACL 5-0.45 % IV SOLN
INTRAVENOUS | Status: DC
  Administered 2016-03-24: 10:00:00 via INTRAVENOUS

## 2016-03-24 MED ORDER — HYDROMORPHONE 1 MG/ML IV SOLN
INTRAVENOUS | Status: DC
Start: 2016-03-24 — End: 2016-03-24
  Administered 2016-03-24: 10:00:00 via INTRAVENOUS
  Administered 2016-03-24: 16.8 mg via INTRAVENOUS
  Administered 2016-03-24: 2.3 mg via INTRAVENOUS
  Filled 2016-03-24: qty 25

## 2016-03-24 NOTE — Discharge Summary (Signed)
[facility] Discharge Summary   Patient ID: MASHAWN COUZENS MRN: [id number] DOB/AGE: Sep 16, 1980 36 y.o.  Admit date: 03/24/2016 Discharge date: 03/24/2016  Primary Care Physician:  [name], MD  Admission Diagnoses:  Active Problems:   Sickle cell anemia with pain [facility])  Discharge Medications:  Allergies as of 03/24/2016   No Known Allergies     Medication List    TAKE these medications   ambrisentan 5 MG tablet Commonly known as:  LETAIRIS Take 5 mg by mouth daily.   aspirin 81 MG chewable tablet Chew 1 tablet (81 mg total) by mouth daily.   carvedilol 3.125 MG tablet Commonly known as:  COREG Take 1 tablet (3.125 mg total) by mouth 2 (two) times daily with a meal.   cholecalciferol 1000 units tablet Commonly known as:  VITAMIN D Take 2,000 Units by mouth daily.   folic acid 1 MG tablet Commonly known as:  FOLVITE Take 1 mg by mouth daily.   gabapentin 300 MG capsule Commonly known as:  NEURONTIN Take 1 capsule (300 mg total) by mouth 3 (three) times daily.   hydroxyurea 500 MG capsule Commonly known as:  HYDREA Take 2 capsules (1,000 mg total) by mouth daily. May take with food to minimize GI side effects.   JADENU 360 MG Tabs Generic drug:  Deferasirox Take 1,080 mg by mouth daily.   morphine 30 MG 12 hr tablet Commonly known as:  MS CONTIN Take 1 tablet (30 mg total) by mouth every 12 (twelve) hours.   rivaroxaban 20 MG Tabs tablet Commonly known as:  XARELTO Take 1 tablet (20 mg total) by mouth at bedtime.   zolpidem 10 MG tablet Commonly known as:  AMBIEN Take 1 tablet (10 mg total) by mouth at bedtime as needed for sleep.        Consults:  None  Significant Diagnostic Studies:  Dg Chest 2 View  Result Date: 03/22/2016 CLINICAL DATA:  Chest/bilateral rib pain since last night. Sickle cell crisis. EXAM: CHEST  2 VIEW COMPARISON:  Chest CT 03/13/2016 and radiographs 03/11/2016 FINDINGS: Left subclavian Port-A-Cath  remains in place, with tip overlying the lower SVC. Cardiac silhouette remains moderately enlarged, unchanged. Mild diffuse chronic interstitial accentuation is unchanged. Focal scarring is noted in the right mid lung. There is no evidence of acute airspace consolidation, pulmonary edema, pleural effusion, or pneumothorax. No acute osseous abnormality is seen. IMPRESSION: No active cardiopulmonary disease. Electronically Signed   By: Logan Bores M.D.   On: 03/22/2016 08:18   Dg Chest 2 View  Result Date: 03/11/2016 CLINICAL DATA:  Sickle cell pain crisis with bilateral chest pain beginning this morning. EXAM: CHEST  2 VIEW COMPARISON:  03/06/2016 FINDINGS: Moderate cardiomegaly remains stable. Left-sided power port remains in appropriate position. Mild scarring and peripheral right midlung is unchanged. No evidence of acute pulmonary infiltrate or edema. No evidence of pleural effusion. IMPRESSION: Stable cardiomegaly.  No active lung disease. Electronically Signed   By: Earle Gell M.D.   On: 03/11/2016 08:55   Dg Chest 2 View  Result Date: 03/06/2016 CLINICAL DATA:  36 year old male with right rib pain and sickle cell crisis. Initial encounter. EXAM: CHEST  2 VIEW COMPARISON:  02/29/2016 and earlier. FINDINGS: Stable cardiomegaly and mediastinal contours. Stable left chest power port, currently accessed. Interval resolved patchy and nodular right upper lung opacity. No pneumothorax, pulmonary edema, pleural effusion or acute pulmonary opacity. Stable basilar predominant mild chronic increased interstitial markings. Negative visible bowel gas pattern. Stable  cholecystectomy clips. No acute osseous abnormality identified. IMPRESSION: Interval resolved right upper lobe opacity. Stable cardiomegaly with no acute cardiopulmonary abnormality. Electronically Signed   By: Genevie Ann M.D.   On: 03/06/2016 10:46   Dg Chest 2 View  Result Date: 02/29/2016 CLINICAL DATA:  Hypoxia and weakness.  Sickle cell. EXAM:  CHEST  2 VIEW COMPARISON:  02/23/2016. FINDINGS: Trachea is midline. Left subclavian power port tip is in the high right atrium. Heart is enlarged, stable. Mild patchy right upper lobe airspace opacification. Streaky atelectasis or scarring in the left lower lobe. No pleural fluid. IMPRESSION: Mild right upper lobe patchy airspace opacification. Electronically Signed   By: Lorin Picket M.D.   On: 02/29/2016 12:53   Dg Chest 2 View  Result Date: 02/23/2016 CLINICAL DATA:  SICKLE CELL CRISIS EXAM: CHEST  2 VIEW COMPARISON:  CT 02/21/2016 FINDINGS: LEFT-SIDED POWER PORT UNCHANGED. STABLE CARDIAC SILHOUETTE. THERE IS MILD CENTRAL VENOUS CONGESTION. NO FOCAL INFILTRATE. NO PLEURAL FLUID. NO PNEUMOTHORAX. IMPRESSION: 1. STABLE CARDIOMEGALY. 2. STABLE CENTRAL VENOUS PULMONARY CONGESTION. 3. NO EVIDENCE OF PULMONARY INFILTRATE Electronically Signed   By: Suzy Bouchard M.D.   On: 02/23/2016 16:35   Ct Angio Chest Pe W And/or Wo Contrast  Result Date: 03/13/2016 CLINICAL DATA:  History of sickle cell. Left-sided chest pain starting this morning about 3 a.m. EXAM: CT ANGIOGRAPHY CHEST WITH CONTRAST TECHNIQUE: Multidetector CT imaging of the chest was performed using the standard protocol during bolus administration of intravenous contrast. Multiplanar CT image reconstructions and MIPs were obtained to evaluate the vascular anatomy. CONTRAST:  100 mL Isovue 370 COMPARISON:  CT chest 03/06/2016, 02/21/2016, 01/05/2016 FINDINGS: Cardiovascular: Satisfactory opacification of the pulmonary arteries to the segmental level. No evidence of pulmonary embolism. Stable cardiomegaly. No pericardial effusion. Normal caliber thoracic aorta. No thoracic aortic atherosclerosis or dissection. Left-sided Port-A-Cath again noted. Mediastinum/Nodes: Thyroid gland, trachea, and esophagus demonstrate no significant findings. Multiple small prevascular lymph nodes unchanged compared with the prior exams. Mildly enlarged stable  subcarinal lymph node. No axillary lymphadenopathy. Lungs/Pleura: Patchy areas of ground-glass opacities in bilateral upper lobes and lower lobes which may reflect mild pulmonary edema versus alveolitis secondary to an infectious or inflammatory etiology. No pleural effusion or pneumothorax. Upper Abdomen: No acute abnormality in the upper abdomen. Chronic calcified, involuted spleen. Musculoskeletal: No acute osseous abnormality. No lytic or sclerotic osseous lesion. Soft tissue: Bilateral retroareolar fibroglandular soft tissue as can be seen with gynecomastia. Review of the MIP images confirms the above findings. IMPRESSION: 1. No evidence of acute pulmonary embolus. 2. Stable cardiomegaly. 3. Patchy areas of ground-glass opacities in bilateral upper lobes and lower lobes which may reflect mild pulmonary edema versus alveolitis secondary to an infectious or inflammatory etiology. Electronically Signed   By: Kathreen Devoid   On: 03/13/2016 10:13   Ct Angio Chest Pe W And/or Wo Contrast  Result Date: 03/06/2016 CLINICAL DATA:  Chest pain, new hypoxia.  Sickle cell disease. EXAM: CT ANGIOGRAPHY CHEST WITH CONTRAST TECHNIQUE: Multidetector CT imaging of the chest was performed using the standard protocol during bolus administration of intravenous contrast. Multiplanar CT image reconstructions and MIPs were obtained to evaluate the vascular anatomy. CONTRAST:  100 cc Isovue 370 Additional 80 cc Isovue 370 given due to port leakage and patient motion. COMPARISON:  Chest CT angiogram dated 02/21/2016. FINDINGS: Cardiovascular: Despite repeat imaging, the peripheral segmental and subsegmental pulmonary arteries to the lower lobes are difficult to definitively characterize due to patient motion artifact and suboptimal contrast opacification. There is,  however, no pulmonary embolism identified within the main, lobar or central segmental pulmonary arteries bilaterally. Patient has had chronic lower lobe subsegmental  pulmonary emboli described on previous exams, not able to be delineated again on today's study due to the patient motion artifact and suboptimal contrast opacification. Thoracic aorta is normal in caliber and configuration. Heart is prominently enlarged, stable compared to the previous exam. No pericardial effusion seen. Mediastinum/Nodes: Numerous small and moderate-sized lymph nodes within the anterior mediastinum are stable. Normal residual thymic tissue is also noted within the anterior mediastinum. Esophagus appears normal. Trachea and central bronchi are unremarkable. Lungs/Pleura: Mild bibasilar scarring/atelectasis is stable. Lungs otherwise clear. No evidence of pneumonia. No pleural effusion or pneumothorax. Upper Abdomen: Limited images of the upper abdomen are unremarkable. Musculoskeletal: No acute or suspicious osseous lesion. Superficial soft tissues are unremarkable. Review of the MIP images confirms the above findings. IMPRESSION: 1. No acute pulmonary embolus identified, with mild study limitations detailed above. 2. Stable cardiomegaly. 3. No pneumonia or pulmonary edema. Stable mild scarring/fibrosis at the lung bases. 4. Stable mediastinal lymphadenopathy. Electronically Signed   By: Franki Cabot M.D.   On: 03/06/2016 13:30     [facility] Course: Johanthan Broad was admitted to the day infusion center for extended observation. Pt was treated with IVF, Toradol and IV analgesics. Pt was treated with a weight based PCA Dilaudid. He has total use of 18.9 with 31 demands and 27 deliveries. Pain has decreased from 9/10 to 4/10. Pt will be discharged home on current medication regimen.  Patient does not have a follow up appointment scheduled with primary care. Will schedule appointment prior to discharge.   Caymon is alert, oriented, and ambulating.   The patient was given clear instructions to go to ER or return to medical center if symptoms do not improve, worsen or new  problems develop. The patient verbalized understanding.  Physical Exam at Discharge:  BP 99/66 (BP Location: Right Arm)   Pulse 78   Temp 98.6 F (37 C) (Oral)   Resp 15   Wt 175 lb (79.4 kg)   SpO2 93%   BMI 23.73 kg/m    General Appearance:    Alert, cooperative, no distress, appears stated age  Head:    Normocephalic, without obvious abnormality, atraumatic  Eyes:    PERRL, conjunctiva/corneas clear, EOM's intact, fundi    benign, Icterus both eyes       Back:     Symmetric, no curvature, ROM normal, no CVA tenderness  Lungs:     Clear to auscultation bilaterally, respirations unlabored  Chest wall:    No tenderness or deformity  Heart:    Irregular rate and rhythm, S1 and S2 normal, no murmur, rub   or gallop  Abdomen:     Soft, non-tender, bowel sounds active all four quadrants,    no masses, no organomegaly  Extremities:   Extremities normal, atraumatic, no cyanosis or edema  Pulses:   2+ and symmetric all extremities  Skin:   Skin color, texture, turgor normal, no rashes or lesions  Lymph nodes:   Cervical, supraclavicular, and axillary nodes normal  Neurologic:   CNII-XII intact. Normal strength, sensation and reflexes      throughout     Disposition at Discharge: 01-Home or Self Care  Discharge Orders: Discharge Instructions    Discharge patient    Complete by:  As directed    Discharge disposition:  01-Home or Self Care   Discharge patient date:  03/24/2016      Condition at Discharge:   Stable  Time spent on Discharge:  15 minutes.  Signed: Briawna Carver M 03/24/2016, 3:49 PM

## 2016-03-24 NOTE — H&P (Addendum)
[facility] History and Physical   Date: 03/24/2016  Patient name: [name] Medical record number: [id number] Date of birth: Jun 16, 1980 Age: 36 y.o. Gender: male PCP: Gerald Chessman, MD  Attending physician: Tresa Garter, MD  Chief Complaint: Bilateral ankle pain  History of Present Illness: [name], a 36 year old male with a history of sickle cell anemia, HbSS presents complaining of pain to ankles bilaterally. Current pain is consistent with sickle cell anemia. Pain intensity is 9/10 described as aching and constant. He states that he had pain with ambulation on yesterday. He had Dilaudid 4 mg around 7 am without sustained relief. Patient wears home oxygen at 2 liters. He is currently not wearing nasal cannula. He denies headache, paresthesias, shortness of breath, chest pain, dysuria, nausea, vomiting, or diarrhea.   Meds: Prescriptions Prior to Admission  Medication Sig Dispense Refill Last Dose  . ambrisentan (LETAIRIS) 5 MG tablet Take 5 mg by mouth daily.    03/23/2016 at Unknown time  . aspirin 81 MG chewable tablet Chew 1 tablet (81 mg total) by mouth daily. 30 tablet 11 03/23/2016 at Unknown time  . carvedilol (COREG) 3.125 MG tablet Take 1 tablet (3.125 mg total) by mouth 2 (two) times daily with a meal. 30 tablet 1 03/23/2016 at Unknown time  . cholecalciferol (VITAMIN D) 1000 units tablet Take 2,000 Units by mouth daily.   03/23/2016 at Unknown time  . Deferasirox (JADENU) 360 MG TABS Take 1,080 mg by mouth daily.    03/23/2016 at Unknown time  . folic acid (FOLVITE) 1 MG tablet Take 1 mg by mouth daily.   03/23/2016 at Unknown time  . gabapentin (NEURONTIN) 300 MG capsule Take 1 capsule (300 mg total) by mouth 3 (three) times daily. 270 capsule 3 03/23/2016 at Unknown time  . hydroxyurea (HYDREA) 500 MG capsule Take 2 capsules (1,000 mg total) by mouth daily. May take with food to minimize GI side effects.   03/23/2016 at Unknown time  .  morphine (MS CONTIN) 30 MG 12 hr tablet Take 1 tablet (30 mg total) by mouth every 12 (twelve) hours. 60 tablet 0 03/24/2016 at Unknown time  . rivaroxaban (XARELTO) 20 MG TABS tablet Take 1 tablet (20 mg total) by mouth at bedtime. 30 tablet 3 03/23/2016 at Unknown time  . zolpidem (AMBIEN) 10 MG tablet Take 1 tablet (10 mg total) by mouth at bedtime as needed for sleep. 30 tablet 0 03/23/2016 at Unknown time    Allergies: Patient has no known allergies. Past Medical History:  Diagnosis Date  . Acute chest syndrome ([place]) 06/18/2013  . Acute embolism and thrombosis of right internal jugular vein ([place])   . Alcohol consumption of one to four drinks per day   . Avascular necrosis (H[facility])    Right Hip  . Blood transfusion   . Chronic anticoagulation   . Demand ischemia ([place]) 01/02/2014  . Former smoker   . Functional asplenia   . Hb-SS disease with crisis ([place])   . History of Clostridium difficile infection   . History of pulmonary embolus (PE)   . Hypertension   . Hypokalemia   . Leukocytosis    Chronic  . Mood disorder ([place])   . Noncompliance with medication regimen   . Oxygen deficiency   . Pulmonary hypertension   . Second hand tobacco smoke exposure   . Sickle cell anemia (HCC)   . Sickle-cell crisis with associated acute chest syndrome ([place]) 05/13/2013  .  Stroke ([place])   . Thrombocytosis (HCC)    Chronic  . Uses marijuana    Past Surgical History:  Procedure Laterality Date  . CHOLECYSTECTOMY     01/2008  . Excision of left periauricular cyst     10/2009  . Excision of right ear lobe cyst with primary closur     11/2007  . Porta cath placement    . Porta cath removal    . PORTACATH PLACEMENT  01/05/2012   Procedure: INSERTION PORT-A-CATH;  Surgeon: Odis Hollingshead, MD;  Location: [place];  Service: General;  Laterality: N/A;  ultrasound guiced port a cath insertion with fluoroscopy  . Right hip replacement     08/2006  . UMBILICAL HERNIA REPAIR     01/2008   Family  History  Problem Relation Age of Onset  . Sickle cell trait Mother   . Depression Mother   . Diabetes Mother   . Sickle cell trait Father   . Sickle cell trait Brother    Social History   Social History  . Marital status: Single    Spouse name: N/A  . Number of children: 0  . Years of education: 69   Occupational History  . 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 Smoking status: Former Smoker    Packs/day: 0.50    Years: 10.00    Types: Cigarettes    Quit date: 05/29/2011  . Smokeless tobacco: Never Used  . Alcohol use No  . Drug use: No     Comment: Marijuana weekly  . Sexual activity: Yes    Partners: Female     Comment: month ago   Other Topics Concern  . Not on file   Social History Narrative   Lives in an apartment.  Single.  Lives alone but has a girlfriend that helps care for him.  Does not use any assist devices.        [name]  [phone] Mom, emergency contact      [facility]:   Patient continuing to live in her apartment in town alone. 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    Review of Systems: Constitutional: negative for fatigue and fevers Eyes: positive for icterus Ears, nose, mouth, throat, and face: negative Respiratory: negative for dyspnea on exertion and pleurisy/chest pain Cardiovascular: negative for chest pain, dyspnea and lower extremity edema Gastrointestinal: negative for abdominal pain, constipation and nausea Genitourinary:negative for dysuria Hematologic/lymphatic: negative Musculoskeletal:positive for myalgias Neurological: negative for dizziness, gait problems, headaches, vertigo and weakness Endocrine: negative  Physical Exam: Blood pressure 112/70, pulse 81, temperature 98.9 F (37.2 C), temperature source Oral, resp. rate 18, weight 175 lb (79.4 kg), SpO2 91 %. BP 99/66 (BP Location: Right Arm)   Pulse 78   Temp 98.6 F (37 C) (Oral)   Resp  15   Wt 175 lb (79.4 kg)   SpO2 93%   BMI 23.73 kg/m   General Appearance:    Alert, cooperative, mild distress, appears stated age  Head:    Normocephalic, without obvious abnormality, atraumatic  Eyes:    PERRL, conjunctiva/corneas clear, EOM's intact, fundi    benign, Icterus both eyes       Ears:    Normal TM's and external ear canals, both ears  Nose:   Nares normal, septum midline, mucosa normal, no drainage    or sinus tenderness  Throat:   Lips, mucosa,  and tongue normal; teeth and gums normal  Neck:   Supple, symmetrical, trachea midline, no adenopathy;       thyroid:  No enlargement/tenderness/nodules; no carotid   bruit or JVD  Back:     Symmetric, no curvature, ROM normal, no CVA tenderness  Lungs:     Clear to auscultation bilaterally, respirations unlabored  Chest wall:    No tenderness or deformity  Heart:    Irregular rate and rhythm, S1 and S2 normal, no murmur, rub   or gallop  Abdomen:     Soft, non-tender, bowel sounds active all four quadrants,    no masses, no organomegaly  Extremities:   Extremities normal, atraumatic, no cyanosis or edema  Pulses:   2+ and symmetric all extremities  Skin:   Skin color, texture, turgor normal, no rashes or lesions  Lymph nodes:   Cervical, supraclavicular, and axillary nodes normal  Neurologic:   CNII-XII intact. Normal strength, sensation and reflexes      throughout    Lab results: Results for orders placed or performed during the hospital encounter of 03/24/16 (from the past 24 hour(s))  Hemoglobin and hematocrit, blood     Status: Abnormal   Collection Time: 03/24/16 10:03 AM  Result Value Ref Range   Hemoglobin 7.7 (L) 13.0 - 17.0 g/dL   HCT 22.4 (L) 39.0 - 52.0 %  Comprehensive metabolic panel     Status: Abnormal   Collection Time: 03/24/16 10:03 AM  Result Value Ref Range   Sodium 138 135 - 145 mmol/L   Potassium 3.5 3.5 - 5.1 mmol/L   Chloride 107 101 - 111 mmol/L   CO2 24 22 - 32 mmol/L   Glucose, Bld 104  (H) 65 - 99 mg/dL   BUN 11 6 - 20 mg/dL   Creatinine, Ser 0.77 0.61 - 1.24 mg/dL   Calcium 9.1 8.9 - 10.3 mg/dL   Total Protein 7.8 6.5 - 8.1 g/dL   Albumin 4.4 3.5 - 5.0 g/dL   AST 39 15 - 41 U/L   ALT 28 17 - 63 U/L   Alkaline Phosphatase 99 38 - 126 U/L   Total Bilirubin 5.0 (H) 0.3 - 1.2 mg/dL   GFR calc non Af Amer >60 >60 mL/min   GFR calc Af Amer >60 >60 mL/min   Anion gap 7 5 - 15   *Note: Due to a large number of results and/or encounters for the requested time period, some results have not been displayed. A complete set of results can be found in Results Review.    Imaging results:  No results found.   Assessment & Plan:  Sickle Cell Anemia with Pain   Patient will be admitted to the day infusion center for extended observation  Start IV D5.45 for cellular rehydration at 125/hr  Start Toradol 30 mg IV times 1 for inflammation.  Start Dilaudid PCA High Concentration per weight based protocol.   Patient will be re-evaluated for pain intensity in the context of function and relationship to baseline as care progresses.  If no significant pain relief, will transfer patient to inpatient services for a higher level of care.   Reviewed labs from 03/22/2016. Icterus, will add CMP.   Oxygen 2 liters, current saturation 91% on room air.    Hollis,Lachina M 03/24/2016, 10:11 AM

## 2016-03-24 NOTE — Progress Notes (Signed)
Pt discharged to home; discharge instructions explained, given, and signed; all questions answered; port deacccessed; pt alert, oriented, and ambulatory; no complications noted

## 2016-03-24 NOTE — Patient Outreach (Signed)
[facility]) Care Management  03/24/2016  [name] Nov 04, 1980 [id number]   Referral received for patient due to ED Utilization. Patient recently engaged while inpatient on 03-15-16 by hospital liaison.  Patient declined services at that time.  Will not outreach to patient as outreach less than 30 days.      Plan: RN Health Coach will send care management assistant message with closure status.    Jone Baseman, RN, MSN [place] [phone]

## 2016-03-24 NOTE — Telephone Encounter (Signed)
Pt called and states experiencing pain in ribs and ankle; states that he took home pain medications with no relief; NP notified; pt notified to come to [facility] for evaluation; pt verbalizes understanding

## 2016-03-27 ENCOUNTER — Inpatient Hospital Stay (HOSPITAL_COMMUNITY)
Admission: EM | Admit: 2016-03-27 | Discharge: 2016-04-02 | DRG: 811 | Disposition: A | Discharge status: Home / Self Care | Payer: Medicare Other | Attending: Internal Medicine | Admitting: Internal Medicine

## 2016-03-27 ENCOUNTER — Encounter (HOSPITAL_COMMUNITY): Payer: Self-pay | Admitting: Emergency Medicine

## 2016-03-27 ENCOUNTER — Emergency Department (HOSPITAL_COMMUNITY): Payer: Medicare Other

## 2016-03-27 ENCOUNTER — Emergency Department (HOSPITAL_COMMUNITY)
Admission: EM | Admit: 2016-03-27 | Discharge: 2016-03-27 | Disposition: A | Discharge status: Home / Self Care | Payer: Medicare Other | Source: Home / Self Care | Attending: Emergency Medicine | Admitting: Emergency Medicine

## 2016-03-27 DIAGNOSIS — I10 Essential (primary) hypertension: Secondary | ICD-10-CM | POA: Diagnosis present

## 2016-03-27 DIAGNOSIS — K5900 Constipation, unspecified: Secondary | ICD-10-CM | POA: Diagnosis present

## 2016-03-27 DIAGNOSIS — Z7982 Long term (current) use of aspirin: Secondary | ICD-10-CM | POA: Insufficient documentation

## 2016-03-27 DIAGNOSIS — R0781 Pleurodynia: Secondary | ICD-10-CM | POA: Diagnosis present

## 2016-03-27 DIAGNOSIS — J9621 Acute and chronic respiratory failure with hypoxia: Secondary | ICD-10-CM | POA: Diagnosis present

## 2016-03-27 DIAGNOSIS — Z818 Family history of other mental and behavioral disorders: Secondary | ICD-10-CM | POA: Diagnosis not present

## 2016-03-27 DIAGNOSIS — Z9049 Acquired absence of other specified parts of digestive tract: Secondary | ICD-10-CM | POA: Diagnosis not present

## 2016-03-27 DIAGNOSIS — I2781 Cor pulmonale (chronic): Secondary | ICD-10-CM | POA: Diagnosis present

## 2016-03-27 DIAGNOSIS — R4781 Slurred speech: Secondary | ICD-10-CM | POA: Diagnosis not present

## 2016-03-27 DIAGNOSIS — J810 Acute pulmonary edema: Secondary | ICD-10-CM | POA: Diagnosis present

## 2016-03-27 DIAGNOSIS — D571 Sickle-cell disease without crisis: Secondary | ICD-10-CM | POA: Insufficient documentation

## 2016-03-27 DIAGNOSIS — Z8673 Personal history of transient ischemic attack (TIA), and cerebral infarction without residual deficits: Secondary | ICD-10-CM

## 2016-03-27 DIAGNOSIS — Z79899 Other long term (current) drug therapy: Secondary | ICD-10-CM

## 2016-03-27 DIAGNOSIS — D72829 Elevated white blood cell count, unspecified: Secondary | ICD-10-CM | POA: Diagnosis not present

## 2016-03-27 DIAGNOSIS — Z79891 Long term (current) use of opiate analgesic: Secondary | ICD-10-CM | POA: Diagnosis not present

## 2016-03-27 DIAGNOSIS — Z9114 Patient's other noncompliance with medication regimen: Secondary | ICD-10-CM | POA: Diagnosis not present

## 2016-03-27 DIAGNOSIS — D5700 Hb-SS disease with crisis, unspecified: Secondary | ICD-10-CM

## 2016-03-27 DIAGNOSIS — Q8901 Asplenia (congenital): Secondary | ICD-10-CM | POA: Diagnosis not present

## 2016-03-27 DIAGNOSIS — D649 Anemia, unspecified: Secondary | ICD-10-CM | POA: Diagnosis present

## 2016-03-27 DIAGNOSIS — Z833 Family history of diabetes mellitus: Secondary | ICD-10-CM | POA: Diagnosis not present

## 2016-03-27 DIAGNOSIS — Z7901 Long term (current) use of anticoagulants: Secondary | ICD-10-CM | POA: Diagnosis not present

## 2016-03-27 DIAGNOSIS — J9611 Chronic respiratory failure with hypoxia: Secondary | ICD-10-CM | POA: Diagnosis not present

## 2016-03-27 DIAGNOSIS — Z86711 Personal history of pulmonary embolism: Secondary | ICD-10-CM | POA: Diagnosis not present

## 2016-03-27 DIAGNOSIS — Z86718 Personal history of other venous thrombosis and embolism: Secondary | ICD-10-CM | POA: Diagnosis not present

## 2016-03-27 DIAGNOSIS — R9431 Abnormal electrocardiogram [ECG] [EKG]: Secondary | ICD-10-CM | POA: Diagnosis not present

## 2016-03-27 DIAGNOSIS — Z9981 Dependence on supplemental oxygen: Secondary | ICD-10-CM | POA: Diagnosis not present

## 2016-03-27 DIAGNOSIS — I2721 Secondary pulmonary arterial hypertension: Secondary | ICD-10-CM | POA: Diagnosis present

## 2016-03-27 DIAGNOSIS — Z96641 Presence of right artificial hip joint: Secondary | ICD-10-CM

## 2016-03-27 DIAGNOSIS — Z87891 Personal history of nicotine dependence: Secondary | ICD-10-CM | POA: Diagnosis not present

## 2016-03-27 DIAGNOSIS — R079 Chest pain, unspecified: Secondary | ICD-10-CM | POA: Diagnosis not present

## 2016-03-27 DIAGNOSIS — R748 Abnormal levels of other serum enzymes: Secondary | ICD-10-CM | POA: Diagnosis not present

## 2016-03-27 DIAGNOSIS — I272 Pulmonary hypertension, unspecified: Secondary | ICD-10-CM | POA: Diagnosis not present

## 2016-03-27 DIAGNOSIS — G894 Chronic pain syndrome: Secondary | ICD-10-CM | POA: Diagnosis not present

## 2016-03-27 DIAGNOSIS — R701 Abnormal plasma viscosity: Secondary | ICD-10-CM | POA: Diagnosis not present

## 2016-03-27 DIAGNOSIS — D638 Anemia in other chronic diseases classified elsewhere: Secondary | ICD-10-CM | POA: Diagnosis present

## 2016-03-27 LAB — COMPREHENSIVE METABOLIC PANEL
ALBUMIN: 4 g/dL (ref 3.5–5.0)
ALT: 30 U/L (ref 17–63)
ALT: 33 U/L (ref 17–63)
ANION GAP: 9 (ref 5–15)
AST: 37 U/L (ref 15–41)
AST: 40 U/L (ref 15–41)
Albumin: 3.9 g/dL (ref 3.5–5.0)
Alkaline Phosphatase: 92 U/L (ref 38–126)
Alkaline Phosphatase: 91 U/L (ref 38–126)
Anion gap: 7 (ref 5–15)
BILIRUBIN TOTAL: 5.8 mg/dL — AB (ref 0.3–1.2)
BUN: 10 mg/dL (ref 6–20)
BUN: 10 mg/dL (ref 6–20)
CALCIUM: 8.8 mg/dL — AB (ref 8.9–10.3)
CHLORIDE: 107 mmol/L (ref 101–111)
CO2: 21 mmol/L — ABNORMAL LOW (ref 22–32)
CO2: 20 mmol/L — AB (ref 22–32)
CREATININE: 0.77 mg/dL (ref 0.61–1.24)
Calcium: 8.6 mg/dL — ABNORMAL LOW (ref 8.9–10.3)
Chloride: 109 mmol/L (ref 101–111)
Creatinine, Ser: 0.85 mg/dL (ref 0.61–1.24)
GFR calc Af Amer: >60 mL/min (ref >60)
GFR calc non Af Amer: >60 mL/min (ref >60)
GLUCOSE: 117 mg/dL — AB (ref 65–99)
Glucose, Bld: 106 mg/dL — ABNORMAL HIGH (ref 65–99)
POTASSIUM: 3.7 mmol/L (ref 3.5–5.1)
Potassium: 3.6 mmol/L (ref 3.5–5.1)
SODIUM: 136 mmol/L (ref 135–145)
Sodium: 137 mmol/L (ref 135–145)
TOTAL PROTEIN: 7.3 g/dL (ref 6.5–8.1)
Total Bilirubin: 5.8 mg/dL — ABNORMAL HIGH (ref 0.3–1.2)
Total Protein: 7.3 g/dL (ref 6.5–8.1)

## 2016-03-27 LAB — CBC WITH DIFFERENTIAL/PLATELET
BASOS ABS: 0 10*3/uL (ref 0.0–0.1)
Basophils Relative: 0 %
EOS ABS: 0.2 10*3/uL (ref 0.0–0.7)
EOS PCT: 1 %
HEMATOCRIT: 21.4 % — AB (ref 39.0–52.0)
Hemoglobin: 7.4 g/dL — ABNORMAL LOW (ref 13.0–17.0)
LYMPHS ABS: 3.4 10*3/uL (ref 0.7–4.0)
Lymphocytes Relative: 22 %
MCH: 30.6 pg (ref 26.0–34.0)
MCHC: 34.6 g/dL (ref 30.0–36.0)
MCV: 88.4 fL (ref 78.0–100.0)
MONO ABS: 1.4 10*3/uL — AB (ref 0.1–1.0)
Monocytes Relative: 9 %
NEUTROS PCT: 68 %
Neutro Abs: 10.5 10*3/uL — ABNORMAL HIGH (ref 1.7–7.7)
PLATELETS: 457 10*3/uL — AB (ref 150–400)
RBC: 2.42 MIL/uL — AB (ref 4.22–5.81)
RDW: 26 % — AB (ref 11.5–15.5)
WBC: 15.5 10*3/uL — AB (ref 4.0–10.5)

## 2016-03-27 LAB — RETICULOCYTES
RBC.: 2.42 MIL/uL — AB (ref 4.22–5.81)
RBC.: 2.53 MIL/uL — ABNORMAL LOW (ref 4.22–5.81)
Retic Count, Absolute: 457.4 10*3/uL — ABNORMAL HIGH (ref 19.0–186.0)
Retic Ct Pct: 18.9 % — ABNORMAL HIGH (ref 0.4–3.1)
Retic Ct Pct: >23 % — ABNORMAL HIGH (ref 0.4–3.1)

## 2016-03-27 LAB — CBC
HCT: 22 % — ABNORMAL LOW (ref 39.0–52.0)
HEMOGLOBIN: 7.4 g/dL — AB (ref 13.0–17.0)
MCH: 29.2 pg (ref 26.0–34.0)
MCHC: 33.6 g/dL (ref 30.0–36.0)
MCV: 87 fL (ref 78.0–100.0)
Platelets: 454 10*3/uL — ABNORMAL HIGH (ref 150–400)
RBC: 2.53 MIL/uL — ABNORMAL LOW (ref 4.22–5.81)
RDW: 24.3 % — AB (ref 11.5–15.5)
WBC: 16 10*3/uL — ABNORMAL HIGH (ref 4.0–10.5)

## 2016-03-27 MED ORDER — HYDROMORPHONE HCL 2 MG/ML IJ SOLN: 2.0000 mg | INTRAMUSCULAR | Status: AC

## 2016-03-27 MED ORDER — ASPIRIN 81 MG PO CHEW
81.0000 mg | CHEWABLE_TABLET | Freq: Every day | ORAL | Status: DC
  Administered 2016-03-28 – 2016-04-02 (×6): 81 mg via ORAL
  Filled 2016-03-27 (×6): qty 1

## 2016-03-27 MED ORDER — VITAMIN D3 25 MCG (1000 UNIT) PO TABS
2000.0000 [IU] | ORAL_TABLET | Freq: Every day | ORAL | Status: DC
  Administered 2016-03-28 – 2016-04-02 (×6): 2000 [IU] via ORAL
  Filled 2016-03-27 (×6): qty 2

## 2016-03-27 MED ORDER — POLYETHYLENE GLYCOL 3350 17 G PO PACK
17.0000 g | PACK | Freq: Every day | ORAL | Status: DC | PRN
  Administered 2016-03-30: 17 g via ORAL
  Filled 2016-03-27: qty 1

## 2016-03-27 MED ORDER — ONDANSETRON HCL 4 MG/2ML IJ SOLN
4.0000 mg | INTRAMUSCULAR | Status: DC | PRN
  Administered 2016-03-27: 4 mg via INTRAVENOUS
  Filled 2016-03-27: qty 2

## 2016-03-27 MED ORDER — ZOLPIDEM TARTRATE 10 MG PO TABS
10.0000 mg | ORAL_TABLET | Freq: Every evening | ORAL | Status: DC | PRN
  Administered 2016-03-27 – 2016-04-01 (×6): 10 mg via ORAL
  Filled 2016-03-27 (×6): qty 1

## 2016-03-27 MED ORDER — KETOROLAC TROMETHAMINE 30 MG/ML IJ SOLN
30.0000 mg | Freq: Once | INTRAMUSCULAR | Status: AC
  Administered 2016-03-27: 30 mg via INTRAVENOUS
  Filled 2016-03-27: qty 1

## 2016-03-27 MED ORDER — HYDROMORPHONE HCL 2 MG/ML IJ SOLN
2.0000 mg | INTRAMUSCULAR | Status: AC
  Administered 2016-03-27: 2 mg via INTRAVENOUS
  Filled 2016-03-27: qty 1

## 2016-03-27 MED ORDER — AMBRISENTAN 5 MG PO TABS
5.0000 mg | ORAL_TABLET | Freq: Every day | ORAL | Status: DC
  Administered 2016-03-29 – 2016-04-02 (×3): 5 mg via ORAL

## 2016-03-27 MED ORDER — GABAPENTIN 300 MG PO CAPS
300.0000 mg | ORAL_CAPSULE | Freq: Three times a day (TID) | ORAL | Status: DC
  Administered 2016-03-27 – 2016-04-02 (×17): 300 mg via ORAL
  Filled 2016-03-27 (×17): qty 1

## 2016-03-27 MED ORDER — CARVEDILOL 3.125 MG PO TABS
3.1250 mg | ORAL_TABLET | Freq: Two times a day (BID) | ORAL | Status: DC
  Administered 2016-03-28 – 2016-04-02 (×11): 3.125 mg via ORAL
  Filled 2016-03-27 (×11): qty 1

## 2016-03-27 MED ORDER — DIPHENHYDRAMINE HCL 25 MG PO CAPS
25.0000 mg | ORAL_CAPSULE | ORAL | Status: DC | PRN
  Administered 2016-03-30 – 2016-04-01 (×3): 25 mg via ORAL
  Filled 2016-03-27 (×4): qty 1

## 2016-03-27 MED ORDER — HYDROMORPHONE 1 MG/ML IV SOLN
INTRAVENOUS | Status: DC
  Administered 2016-03-27: 19:00:00 via INTRAVENOUS
  Administered 2016-03-28: 1 mg via INTRAVENOUS
  Administered 2016-03-28: 2.7 mL via INTRAVENOUS
  Administered 2016-03-28: 6.3 mg via INTRAVENOUS
  Administered 2016-03-28: 11.75 mL via INTRAVENOUS
  Administered 2016-03-28: 9.8 mL via INTRAVENOUS
  Administered 2016-03-28: 06:00:00 via INTRAVENOUS
  Administered 2016-03-28: 16.1 mg via INTRAVENOUS
  Administered 2016-03-28: 1 mg via INTRAVENOUS
  Administered 2016-03-29: 3.5 mg via INTRAVENOUS
  Administered 2016-03-29: 13.3 mg via INTRAVENOUS
  Administered 2016-03-29: 10.4 mg via INTRAVENOUS
  Administered 2016-03-29: 25 mg via INTRAVENOUS
  Administered 2016-03-29: 0.7 mg via INTRAVENOUS
  Filled 2016-03-27 (×4): qty 25

## 2016-03-27 MED ORDER — KETOROLAC TROMETHAMINE 30 MG/ML IJ SOLN
30.0000 mg | Freq: Four times a day (QID) | INTRAMUSCULAR | Status: AC
  Administered 2016-03-27 – 2016-04-01 (×20): 30 mg via INTRAVENOUS
  Filled 2016-03-27 (×22): qty 1

## 2016-03-27 MED ORDER — FOLIC ACID 1 MG PO TABS
1.0000 mg | ORAL_TABLET | Freq: Every day | ORAL | Status: DC
  Administered 2016-03-28 – 2016-04-02 (×6): 1 mg via ORAL
  Filled 2016-03-27 (×6): qty 1

## 2016-03-27 MED ORDER — ONDANSETRON HCL 4 MG/2ML IJ SOLN: 4.0000 mg | Freq: Four times a day (QID) | INTRAMUSCULAR | Status: DC | PRN

## 2016-03-27 MED ORDER — MORPHINE SULFATE ER 30 MG PO TBCR
30.0000 mg | EXTENDED_RELEASE_TABLET | Freq: Two times a day (BID) | ORAL | Status: DC
  Administered 2016-03-27 – 2016-04-02 (×12): 30 mg via ORAL
  Filled 2016-03-27 (×12): qty 1

## 2016-03-27 MED ORDER — AMBRISENTAN 5 MG PO TABS
5.0000 mg | ORAL_TABLET | Freq: Every day | ORAL | Status: DC
  Filled 2016-03-27: qty 1

## 2016-03-27 MED ORDER — SODIUM CHLORIDE 0.9% FLUSH: 9.0000 mL | INTRAVENOUS | Status: DC | PRN

## 2016-03-27 MED ORDER — SENNOSIDES-DOCUSATE SODIUM 8.6-50 MG PO TABS
1.0000 | ORAL_TABLET | Freq: Two times a day (BID) | ORAL | Status: DC
  Administered 2016-03-27 – 2016-04-02 (×12): 1 via ORAL
  Filled 2016-03-27 (×12): qty 1

## 2016-03-27 MED ORDER — NALOXONE HCL 0.4 MG/ML IJ SOLN: 0.4000 mg | INTRAMUSCULAR | Status: DC | PRN

## 2016-03-27 MED ORDER — DIPHENHYDRAMINE HCL 50 MG/ML IJ SOLN
25.0000 mg | Freq: Once | INTRAMUSCULAR | Status: AC
  Administered 2016-03-27: 25 mg via INTRAVENOUS
  Filled 2016-03-27: qty 1

## 2016-03-27 MED ORDER — DIPHENHYDRAMINE HCL 25 MG PO CAPS
25.0000 mg | ORAL_CAPSULE | ORAL | Status: DC | PRN
  Administered 2016-03-27: 25 mg via ORAL
  Filled 2016-03-27: qty 1

## 2016-03-27 MED ORDER — SODIUM CHLORIDE 0.9 % IV SOLN
25.0000 mg | INTRAVENOUS | Status: DC | PRN
  Filled 2016-03-27: qty 0.5

## 2016-03-27 MED ORDER — HYDROMORPHONE HCL 1 MG/ML IJ SOLN: 0.5000 mg | Freq: Once | INTRAMUSCULAR | Status: DC

## 2016-03-27 MED ORDER — HYDROXYUREA 500 MG PO CAPS
1000.0000 mg | ORAL_CAPSULE | Freq: Every day | ORAL | Status: DC
  Administered 2016-03-28 – 2016-04-02 (×6): 1000 mg via ORAL
  Filled 2016-03-27 (×6): qty 2

## 2016-03-27 MED ORDER — DEFERASIROX 360 MG PO TABS: 1080.0000 mg | ORAL_TABLET | Freq: Every day | ORAL | Status: DC

## 2016-03-27 MED ORDER — HYDROMORPHONE HCL 2 MG/ML IJ SOLN
2.0000 mg | Freq: Once | INTRAMUSCULAR | Status: AC
  Administered 2016-03-27: 2 mg via INTRAVENOUS
  Filled 2016-03-27: qty 1

## 2016-03-27 MED ORDER — SODIUM CHLORIDE 0.45 % IV SOLN
INTRAVENOUS | Status: DC
  Administered 2016-03-27: 04:00:00 via INTRAVENOUS

## 2016-03-27 MED ORDER — HEPARIN SOD (PORK) LOCK FLUSH 100 UNIT/ML IV SOLN
500.0000 [IU] | Freq: Once | INTRAVENOUS | Status: AC
  Administered 2016-03-27: 500 [IU]
  Filled 2016-03-27: qty 5

## 2016-03-27 MED ORDER — RIVAROXABAN 20 MG PO TABS
20.0000 mg | ORAL_TABLET | Freq: Every day | ORAL | Status: DC
  Administered 2016-03-27 – 2016-04-01 (×6): 20 mg via ORAL
  Filled 2016-03-27 (×6): qty 1

## 2016-03-27 NOTE — ED Notes (Signed)
ED Provider at bedside. 

## 2016-03-27 NOTE — ED Provider Notes (Signed)
[place] DEPT Provider Note   CSN: [id number] Arrival date & time: 03/27/16  0243     History   Chief Complaint Chief Complaint  Patient presents with  . Sickle Cell Pain Crisis    HPI [name] is a 36 y.o. male.  Patient with history of sickle cell anemia presents with pain in bilateral lower rib pain and left LE pain consistent with pain of typical crisis. No cough or fever. He is not vomiting. Pain medications at home are not working. He states the current pain episode started at 1:00 am that woke him from sleep. He took his medication and when there was no response by 1:45 he came to the emergency department for further treatment.    The history is provided by the patient. No language interpreter was used.  Sickle Cell Pain Crisis  Associated symptoms: no chest pain, no cough, no fever, no nausea, no shortness of breath and no vomiting     Past Medical History:  Diagnosis Date  . Acute chest syndrome ([place]) 06/18/2013  . Acute embolism and thrombosis of right internal jugular vein ([place])   . Alcohol consumption of one to four drinks per day   . Avascular necrosis (HCC)    Right Hip  . Blood transfusion   . Chronic anticoagulation   . Demand ischemia ([place]) 01/02/2014  . Former smoker   . Functional asplenia   . Hb-SS disease with crisis ([place])   . History of Clostridium difficile infection   . History of pulmonary embolus (PE)   . Hypertension   . Hypokalemia   . Leukocytosis    Chronic  . Mood disorder ([place])   . Noncompliance with medication regimen   . Oxygen deficiency   . Pulmonary hypertension   . Second hand tobacco smoke exposure   . Sickle cell anemia (HCC)   . Sickle-cell crisis with associated acute chest syndrome ([place]) 05/13/2013  . Stroke ([place])   . Thrombocytosis (HCC)    Chronic  . Uses marijuana     Patient Active Problem List   Diagnosis Date Noted  . Sickle cell anemia with crisis ([place]) 03/22/2016  . Chest pain 03/13/2016  .  Hypoxia 03/06/2016  . Slurred speech 11/10/2015  . Ischemic stroke ([place])   . Sickle cell anemia with pain ([place]) 05/07/2015  . Anemia 01/31/2015  . Chronic anemia   . Chest pain syndrome   . Hb-SS disease without crisis ([place]) 10/07/2014  . Acute on chronic respiratory failure with hypoxia ([place])   . Abnormal EKG 09/10/2014  . Troponin level elevated 08/29/2014  . Cor pulmonale, chronic ([place]) 08/25/2014  . Sickle cell anemia ([place]) 06/25/2014  . Anemia of chronic disease 06/25/2014  . PAH (pulmonary artery hypertension) 03/18/2014  . Chronic respiratory failure with hypoxia ([place]) 03/14/2014  . Paralytic strabismus, external ophthalmoplegia   . Chronic pain syndrome 12/12/2013  . Chronic anticoagulation 08/22/2013  . Essential hypertension 08/22/2013  . Pulmonary HTN 06/18/2013  . Functional asplenia   . Vitamin D deficiency 02/13/2013  . Hx of pulmonary embolus 06/29/2012  . Hemochromatosis 12/14/2011  . Avascular necrosis [facility])     Past Surgical History:  Procedure Laterality Date  . CHOLECYSTECTOMY     01/2008  . Excision of left periauricular cyst     10/2009  . Excision of right ear lobe cyst with primary closur     11/2007  . Porta cath placement    . Porta cath removal    . PORTACATH  PLACEMENT  01/05/2012   Procedure: INSERTION PORT-A-CATH;  Surgeon: Odis Hollingshead, MD;  Location: [facility];  Service: General;  Laterality: N/A;  ultrasound guiced port a cath insertion with fluoroscopy  . Right hip replacement     08/2006  . UMBILICAL HERNIA REPAIR     01/2008       Home Medications    Prior to Admission medications   Medication Sig Start Date End Date Taking? Authorizing Provider  ambrisentan (LETAIRIS) 5 MG tablet Take 5 mg by mouth daily.    Yes Historical Provider, MD  aspirin 81 MG chewable tablet Chew 1 tablet (81 mg total) by mouth daily. 07/23/14  Yes [name], MD  carvedilol (COREG) 3.125 MG tablet Take 1 tablet (3.125 mg total) by mouth 2 (two)  times daily with a meal. 03/17/16  Yes [name], MD  cholecalciferol (VITAMIN D) 1000 units tablet Take 2,000 Units by mouth daily.   Yes Historical Provider, MD  Deferasirox (JADENU) 360 MG TABS Take 1,080 mg by mouth daily.    Yes Historical Provider, MD  folic acid (FOLVITE) 1 MG tablet Take 1 mg by mouth daily.   Yes Historical Provider, MD  gabapentin (NEURONTIN) 300 MG capsule Take 1 capsule (300 mg total) by mouth 3 (three) times daily. 06/01/15  Yes Tresa Garter, MD  hydroxyurea (HYDREA) 500 MG capsule Take 2 capsules (1,000 mg total) by mouth daily. May take with food to minimize GI side effects. 03/17/16  Yes [name], MD  morphine (MS CONTIN) 30 MG 12 hr tablet Take 1 tablet (30 mg total) by mouth every 12 (twelve) hours. 03/08/16 04/07/16 Yes Tresa Garter, MD  rivaroxaban (XARELTO) 20 MG TABS tablet Take 1 tablet (20 mg total) by mouth at bedtime. 12/29/15  Yes Tresa Garter, MD  zolpidem (AMBIEN) 10 MG tablet Take 1 tablet (10 mg total) by mouth at bedtime as needed for sleep. 03/08/16 04/07/16 Yes Tresa Garter, MD    Family History Family History  Problem Relation Age of Onset  . Sickle cell trait Mother   . Depression Mother   . Diabetes Mother   . Sickle cell trait Father   . Sickle cell trait Brother     Social History Social History  Substance Use Topics  . Smoking status: Former Smoker    Packs/day: 0.50    Years: 10.00    Types: Cigarettes    Quit date: 05/29/2011  . Smokeless tobacco: Never Used  . Alcohol use No     Allergies   Patient has no known allergies.   Review of Systems Review of Systems  Constitutional: Negative for chills and fever.  Respiratory: Negative.  Negative for cough and shortness of breath.   Cardiovascular: Negative.  Negative for chest pain and leg swelling.  Gastrointestinal: Negative.  Negative for abdominal pain, nausea and vomiting.  Musculoskeletal: Negative.        See HPI  Skin:  Negative.  Negative for color change.  Neurological: Negative.  Negative for weakness.     Physical Exam Updated Vital Signs BP 109/70   Pulse 86   Temp 98.2 F (36.8 C) (Oral)   Resp 18   Ht 6' (1.829 m)   Wt 79.4 kg   SpO2 93%   BMI 23.73 kg/m   Physical Exam  Constitutional: He is oriented to person, place, and time. He appears well-developed and well-nourished.  HENT:  Head: Normocephalic.  Neck: Normal range of motion.  Neck supple.  Cardiovascular: Normal rate and regular rhythm.   Pulmonary/Chest: Effort normal and breath sounds normal. He has no wheezes. He has no rales.  Abdominal: Soft. Bowel sounds are normal. There is no tenderness. There is no rebound and no guarding.  Musculoskeletal: Normal range of motion.  Neurological: He is alert and oriented to person, place, and time.  Skin: Skin is warm and dry. No rash noted.  Psychiatric: He has a normal mood and affect.     ED Treatments / Results  Labs (all labs ordered are listed, but only abnormal results are displayed) Labs Reviewed  COMPREHENSIVE METABOLIC PANEL - Abnormal; Notable for the following:       Result Value   CO2 21 (*)    Glucose, Bld 106 (*)    Calcium 8.8 (*)    Total Bilirubin 5.8 (*)    All other components within normal limits  CBC WITH DIFFERENTIAL/PLATELET - Abnormal; Notable for the following:    WBC 15.5 (*)    RBC 2.42 (*)    Hemoglobin 7.4 (*)    HCT 21.4 (*)    RDW 26.0 (*)    Platelets 457 (*)    Neutro Abs 10.5 (*)    Monocytes Absolute 1.4 (*)    All other components within normal limits  RETICULOCYTES - Abnormal; Notable for the following:    Retic Ct Pct 18.9 (*)    RBC. 2.42 (*)    Retic Count, Manual 457.4 (*)    All other components within normal limits    EKG  EKG Interpretation None       Radiology No results found.  Procedures Procedures (including critical care time)  Medications Ordered in ED Medications  HYDROmorphone (DILAUDID) injection  0.5 mg (0.5 mg Subcutaneous Not Given 03/27/16 0336)  0.45 % sodium chloride infusion ( Intravenous Stopped 03/27/16 0647)  diphenhydrAMINE (BENADRYL) capsule 25-50 mg (25 mg Oral Given 03/27/16 0332)  ondansetron (ZOFRAN) injection 4 mg (4 mg Intravenous Given 03/27/16 0334)  HYDROmorphone (DILAUDID) injection 2 mg (2 mg Intravenous Given 03/27/16 0335)    Or  HYDROmorphone (DILAUDID) injection 2 mg ( Subcutaneous See Alternative 03/27/16 0335)  HYDROmorphone (DILAUDID) injection 2 mg (2 mg Intravenous Given 03/27/16 0434)    Or  HYDROmorphone (DILAUDID) injection 2 mg ( Subcutaneous See Alternative 03/27/16 0434)  HYDROmorphone (DILAUDID) injection 2 mg (2 mg Intravenous Given 03/27/16 0519)    Or  HYDROmorphone (DILAUDID) injection 2 mg ( Subcutaneous See Alternative 03/27/16 0519)  ketorolac (TORADOL) 30 MG/ML injection 30 mg (30 mg Intravenous Given 03/27/16 0648)  heparin lock flush 100 unit/mL (500 Units Intracatheter Given 03/27/16 0650)     Initial Impression / Assessment and Plan / ED Course  I have reviewed the triage vital signs and the nursing notes.  Pertinent labs & imaging results that were available during my care of the patient were reviewed by me and considered in my medical decision making (see chart for details).     Patient with sickle cell anemia presents with typical pain crisis. No fever or cough. Normal VS.  Pain controlled with protocol sickle cell medications on order set. Labs stable. Feel he is stable for discharge home. The patient is agreeable to going home.   Final Clinical Impressions(s) / ED Diagnoses   Final diagnoses:  None   1. Sickle Cell anemia with pain New Prescriptions New Prescriptions   No medications on file     Charlann Lange, PA-C 03/27/16 0701  Merryl Hacker, MD 03/28/16 [phone]

## 2016-03-27 NOTE — ED Notes (Signed)
Patient transported to X-ray 

## 2016-03-27 NOTE — ED Provider Notes (Signed)
[place] DEPT Provider Note   CSN: [id number] Arrival date & time: 03/27/16  1359     History   Chief Complaint Chief Complaint  Patient presents with  . Sickle Cell Pain Crisis    HPI [name] is a 36 y.o. male.  HPI Patient has a history of sickle cell disease and presents emergency department for complaints of left thigh and left lateral chest pain.  He wears oxygen at home but does not have transported wall oxygen and presented the emergency department today with hypoxia that resolved once he was put back on his home oxygen dose.  He presents with increasing pain over the past 24 hours his left chest.  No fevers or chills.  No productive cough.  He also reports left lateral thigh pain.  Noted swelling in his left lower extremity.  He is on Xarelto.  Pain is severe in severity.  Pain is not improved with his home pain medications    Past Medical History:  Diagnosis Date  . Acute chest syndrome ([place]) 06/18/2013  . Acute embolism and thrombosis of right internal jugular vein ([place])   . Alcohol consumption of one to four drinks per day   . Avascular necrosis (HCC)    Right Hip  . Blood transfusion   . Chronic anticoagulation   . Demand ischemia ([place]) 01/02/2014  . Former smoker   . Functional asplenia   . Hb-SS disease with crisis ([place])   . History of Clostridium difficile infection   . History of pulmonary embolus (PE)   . Hypertension   . Hypokalemia   . Leukocytosis    Chronic  . Mood disorder ([place])   . Noncompliance with medication regimen   . Oxygen deficiency   . Pulmonary hypertension   . Second hand tobacco smoke exposure   . Sickle cell anemia (HCC)   . Sickle-cell crisis with associated acute chest syndrome ([place]) 05/13/2013  . Stroke ([place])   . Thrombocytosis (HCC)    Chronic  . Uses marijuana     Patient Active Problem List   Diagnosis Date Noted  . Sickle cell anemia with crisis ([place]) 03/22/2016  . Chest pain 03/13/2016  . Hypoxia  03/06/2016  . Slurred speech 11/10/2015  . Ischemic stroke ([place])   . Sickle cell anemia with pain ([place]) 05/07/2015  . Anemia 01/31/2015  . Chronic anemia   . Chest pain syndrome   . Hb-SS disease without crisis ([place]) 10/07/2014  . Acute on chronic respiratory failure with hypoxia ([place])   . Abnormal EKG 09/10/2014  . Troponin level elevated 08/29/2014  . Cor pulmonale, chronic ([place]) 08/25/2014  . Sickle cell anemia ([place]) 06/25/2014  . Anemia of chronic disease 06/25/2014  . PAH (pulmonary artery hypertension) 03/18/2014  . Chronic respiratory failure with hypoxia ([place]) 03/14/2014  . Paralytic strabismus, external ophthalmoplegia   . Chronic pain syndrome 12/12/2013  . Chronic anticoagulation 08/22/2013  . Essential hypertension 08/22/2013  . Pulmonary HTN 06/18/2013  . Functional asplenia   . Vitamin D deficiency 02/13/2013  . Hx of pulmonary embolus 06/29/2012  . Hemochromatosis 12/14/2011  . Avascular necrosis [facility])     Past Surgical History:  Procedure Laterality Date  . CHOLECYSTECTOMY     01/2008  . Excision of left periauricular cyst     10/2009  . Excision of right ear lobe cyst with primary closur     11/2007  . Porta cath placement    . Porta cath removal    . PORTACATH  PLACEMENT  01/05/2012   Procedure: INSERTION PORT-A-CATH;  Surgeon: Odis Hollingshead, MD;  Location: [facility];  Service: General;  Laterality: N/A;  ultrasound guiced port a cath insertion with fluoroscopy  . Right hip replacement     08/2006  . UMBILICAL HERNIA REPAIR     01/2008       Home Medications    Prior to Admission medications   Medication Sig Start Date End Date Taking? Authorizing Provider  ambrisentan (LETAIRIS) 5 MG tablet Take 5 mg by mouth daily.    Yes Historical Provider, MD  aspirin 81 MG chewable tablet Chew 1 tablet (81 mg total) by mouth daily. 07/23/14  Yes [name], MD  carvedilol (COREG) 3.125 MG tablet Take 1 tablet (3.125 mg total) by mouth 2 (two) times  daily with a meal. 03/17/16  Yes [name], MD  cholecalciferol (VITAMIN D) 1000 units tablet Take 2,000 Units by mouth daily.   Yes Historical Provider, MD  Deferasirox (JADENU) 360 MG TABS Take 1,080 mg by mouth daily.    Yes Historical Provider, MD  folic acid (FOLVITE) 1 MG tablet Take 1 mg by mouth daily.   Yes Historical Provider, MD  gabapentin (NEURONTIN) 300 MG capsule Take 1 capsule (300 mg total) by mouth 3 (three) times daily. 06/01/15  Yes Tresa Garter, MD  hydroxyurea (HYDREA) 500 MG capsule Take 2 capsules (1,000 mg total) by mouth daily. May take with food to minimize GI side effects. 03/17/16  Yes [name], MD  morphine (MS CONTIN) 30 MG 12 hr tablet Take 1 tablet (30 mg total) by mouth every 12 (twelve) hours. 03/08/16 04/07/16 Yes Tresa Garter, MD  rivaroxaban (XARELTO) 20 MG TABS tablet Take 1 tablet (20 mg total) by mouth at bedtime. 12/29/15  Yes Tresa Garter, MD  zolpidem (AMBIEN) 10 MG tablet Take 1 tablet (10 mg total) by mouth at bedtime as needed for sleep. 03/08/16 04/07/16 Yes Tresa Garter, MD    Family History Family History  Problem Relation Age of Onset  . Sickle cell trait Mother   . Depression Mother   . Diabetes Mother   . Sickle cell trait Father   . Sickle cell trait Brother     Social History Social History  Substance Use Topics  . Smoking status: Former Smoker    Packs/day: 0.50    Years: 10.00    Types: Cigarettes    Quit date: 05/29/2011  . Smokeless tobacco: Never Used  . Alcohol use No     Allergies   Patient has no known allergies.   Review of Systems Review of Systems  All other systems reviewed and are negative.    Physical Exam Updated Vital Signs BP 124/85   Pulse 79   Temp 98.2 F (36.8 C) (Oral)   Resp 23   Ht 6' (1.829 m)   Wt 175 lb (79.4 kg)   SpO2 90%   BMI 23.73 kg/m   Physical Exam  Constitutional: He is oriented to person, place, and time. He appears well-developed  and well-nourished.  HENT:  Head: Normocephalic and atraumatic.  Eyes: EOM are normal.  Neck: Normal range of motion.  Cardiovascular: Normal rate, regular rhythm, normal heart sounds and intact distal pulses.   Pulmonary/Chest: Effort normal and breath sounds normal. No respiratory distress.  Abdominal: Soft. He exhibits no distension. There is no tenderness.  Musculoskeletal: Normal range of motion.  Neurological: He is alert and oriented to person, place,  and time.  Skin: Skin is warm and dry.  Psychiatric: He has a normal mood and affect. Judgment normal.  Nursing note and vitals reviewed.    ED Treatments / Results  Labs (all labs ordered are listed, but only abnormal results are displayed) Labs Reviewed  CBC - Abnormal; Notable for the following:       Result Value   WBC 16.0 (*)    RBC 2.53 (*)    Hemoglobin 7.4 (*)    HCT 22.0 (*)    RDW 24.3 (*)    Platelets 454 (*)    All other components within normal limits  RETICULOCYTES - Abnormal; Notable for the following:    Retic Ct Pct >23.0 (*)    RBC. 2.53 (*)    All other components within normal limits  COMPREHENSIVE METABOLIC PANEL    EKG  EKG Interpretation None       Radiology Dg Chest 2 View  Result Date: 03/27/2016 CLINICAL DATA:  Left chest pain.  Sickle cell anemia EXAM: CHEST  2 VIEW COMPARISON:  03/22/2016 FINDINGS: Cardiac enlargement with vascular congestion. Negative for edema or effusion. Negative for pneumonia. Port-A-Cath tip in the SVC. IMPRESSION: Cardiac enlargement with pulmonary vascular congestion. Negative for pneumonia or edema. Electronically Signed   By: Franchot Gallo M.D.   On: 03/27/2016 16:02    Procedures Procedures (including critical care time)  Medications Ordered in ED Medications  HYDROmorphone (DILAUDID) injection 2 mg (2 mg Intravenous Given 03/27/16 1539)  HYDROmorphone (DILAUDID) injection 2 mg (2 mg Intravenous Given 03/27/16 1633)  diphenhydrAMINE (BENADRYL)  injection 25 mg (25 mg Intravenous Given 03/27/16 1708)  HYDROmorphone (DILAUDID) injection 2 mg (2 mg Intravenous Given 03/27/16 1708)     Initial Impression / Assessment and Plan / ED Course  I have reviewed the triage vital signs and the nursing notes.  Pertinent labs & imaging results that were available during my care of the patient were reviewed by me and considered in my medical decision making (see chart for details).     No signs of acute chest.  Patient is compliant with his medications including his pain medications and his Xarelto.  Patient's pain is being managed and treated at this time.  Hopefully we can make him feel better and he will be safe for discharge.  We're unable to control his pain he may need observational admission for pain control/sickle cell crisis pain  5:20 PM Patient continues to have pain and discomfort this time despite 6 mg of IV Dilaudid.  Patient be admitted the hospital for ongoing pain control and sickle cell crisis related pain.  Final Clinical Impressions(s) / ED Diagnoses   Final diagnoses:  None    New Prescriptions New Prescriptions   No medications on file     Jola Schmidt, MD 03/27/16 [id number]

## 2016-03-27 NOTE — ED Notes (Signed)
Increased patient to 4 L [place]

## 2016-03-27 NOTE — ED Triage Notes (Signed)
Pt c/o sickle cell pain worsening today, pain worse on left side and left rib cage. Pt was placed on 3L [place] now 93%. Recently admitted for PE.

## 2016-03-27 NOTE — H&P (Signed)
History and Physical    [name] [id number] DOB: 1980/04/30 DOA: 03/27/2016  PCP: [name], MD Patient coming from: Home  Chief Complaint: Back and rib pain  HPI: [name] is a 36 y.o. male with medical history significant of sickle cell disease, chronic respiratory failure with hypoxia, pulmonary artery hypertension, essential hypertension, stroke avascular necrosis. Patient reports sharp lower back and bilateral lower rib pain that started 4 days ago. He has been taking his oral narcotics at home without any benefit to his pain. He reports no chest pain, cough, sneezing, rhinorrhea, fevers. Pain is localized to these areas without radiation and is constant.  ED Course: Vitals: Afebrile. Normal pulse. Respirations and high teens to low 20s. Blood pressure is normotensive. SPO2 of 89% via nasal cannula Labs: CO2 of 20, white blood cell count of 16 K, hemoglobin of 7.4, hematocrit of 22, reticulocyte count greater than 23% Imaging: Chest x-ray significant for no evidence of acute cardiopulmonary disease with evidence of cardiomegaly and pulmonary vascular congestion. Medications/Course: Patient given 6 mg of Dilaudid total and 25 mg of Benadryl IV  Review of Systems: Review of Systems  Constitutional: Negative for chills and fever.  Respiratory: Negative for cough, hemoptysis, sputum production, shortness of breath and wheezing.   Cardiovascular: Negative for chest pain, palpitations and orthopnea.  Gastrointestinal: Negative for abdominal pain, constipation, diarrhea, nausea and vomiting.  Genitourinary: Negative for dysuria.  All other systems reviewed and are negative.   Past Medical History:  Diagnosis Date  . Acute chest syndrome ([place]) 06/18/2013  . Acute embolism and thrombosis of right internal jugular vein ([place])   . Alcohol consumption of one to four drinks per day   . Avascular necrosis (H[facility])    Right Hip  . Blood transfusion   . Chronic  anticoagulation   . Demand ischemia ([place]) 01/02/2014  . Former smoker   . Functional asplenia   . Hb-SS disease with crisis ([place])   . History of Clostridium difficile infection   . History of pulmonary embolus (PE)   . Hypertension   . Hypokalemia   . Leukocytosis    Chronic  . Mood disorder ([place])   . Noncompliance with medication regimen   . Oxygen deficiency   . Pulmonary hypertension   . Second hand tobacco smoke exposure   . Sickle cell anemia (HCC)   . Sickle-cell crisis with associated acute chest syndrome ([place]) 05/13/2013  . Stroke ([place])   . Thrombocytosis (HCC)    Chronic  . Uses marijuana     Past Surgical History:  Procedure Laterality Date  . CHOLECYSTECTOMY     01/2008  . Excision of left periauricular cyst     10/2009  . Excision of right ear lobe cyst with primary closur     11/2007  . Porta cath placement    . Porta cath removal    . PORTACATH PLACEMENT  01/05/2012   Procedure: INSERTION PORT-A-CATH;  Surgeon: Odis Hollingshead, MD;  Location: [facility];  Service: General;  Laterality: N/A;  ultrasound guiced port a cath insertion with fluoroscopy  . Right hip replacement     08/2006  . UMBILICAL HERNIA REPAIR     01/2008     reports that he quit smoking about 4 years ago. His smoking use included Cigarettes. He has a 5.00 pack-year smoking history. He has never used smokeless tobacco. He reports that he does not drink alcohol or use drugs.  No Known Allergies  Family History  Problem Relation Age of Onset  . Sickle cell trait Mother   . Depression Mother   . Diabetes Mother   . Sickle cell trait Father   . Sickle cell trait Brother     Prior to Admission medications   Medication Sig Start Date End Date Taking? Authorizing Provider  ambrisentan (LETAIRIS) 5 MG tablet Take 5 mg by mouth daily.    Yes Historical Provider, MD  aspirin 81 MG chewable tablet Chew 1 tablet (81 mg total) by mouth daily. 07/23/14  Yes [name], MD  carvedilol (COREG)  3.125 MG tablet Take 1 tablet (3.125 mg total) by mouth 2 (two) times daily with a meal. 03/17/16  Yes [name], MD  cholecalciferol (VITAMIN D) 1000 units tablet Take 2,000 Units by mouth daily.   Yes Historical Provider, MD  Deferasirox (JADENU) 360 MG TABS Take 1,080 mg by mouth daily.    Yes Historical Provider, MD  folic acid (FOLVITE) 1 MG tablet Take 1 mg by mouth daily.   Yes Historical Provider, MD  gabapentin (NEURONTIN) 300 MG capsule Take 1 capsule (300 mg total) by mouth 3 (three) times daily. 06/01/15  Yes Tresa Garter, MD  hydroxyurea (HYDREA) 500 MG capsule Take 2 capsules (1,000 mg total) by mouth daily. May take with food to minimize GI side effects. 03/17/16  Yes [name], MD  morphine (MS CONTIN) 30 MG 12 hr tablet Take 1 tablet (30 mg total) by mouth every 12 (twelve) hours. 03/08/16 04/07/16 Yes Tresa Garter, MD  rivaroxaban (XARELTO) 20 MG TABS tablet Take 1 tablet (20 mg total) by mouth at bedtime. 12/29/15  Yes Tresa Garter, MD  zolpidem (AMBIEN) 10 MG tablet Take 1 tablet (10 mg total) by mouth at bedtime as needed for sleep. 03/08/16 04/07/16 Yes Tresa Garter, MD    Physical Exam: Vitals:   03/27/16 1715 03/27/16 1730 03/27/16 1745 03/27/16 1800  BP:  122/77  114/83  Pulse: 93 86 81 81  Resp: 17 19 20 24   Temp:      TempSrc:      SpO2: (!) 85% (!) 85% (!) 87% (!) 88%  Weight:      Height:         Constitutional: NAD, calm, comfortable, Looks older than stated age Eyes: PERRL, lids and scleral icterus bilaterally ENMT: Mucous membranes are moist. Posterior pharynx clear of any exudate or lesions.  Neck: normal, supple, no masses, no thyromegaly Respiratory: clear to auscultation bilaterally, no wheezing, no crackles. Normal respiratory effort. No accessory muscle use.  Cardiovascular: Regular rate and rhythm, no murmurs / rubs / gallops. No extremity edema. 2+ pedal pulses. No carotid bruits.  Abdomen: no tenderness, no  masses palpated. No hepatosplenomegaly. Bowel sounds positive.  Musculoskeletal: no clubbing / cyanosis. No joint deformity upper and lower extremities. Good ROM, no contractures. Normal muscle tone. No tenderness over lumbar back or over ribs bilaterally Skin: no rashes, lesions, ulcers. No induration Neurologic: CN 2-12 grossly intact. Sensation intact, DTR normal. Strength 5/5 in all 4.  Psychiatric: Normal judgment and insight. Alert and oriented x 3. Normal mood.    Labs on Admission: I have personally reviewed following labs and imaging studies  CBC:  Recent Labs Lab 03/22/16 0826 03/24/16 1003 03/27/16 0316 03/27/16 1419  WBC 15.1*  --  15.5* 16.0*  NEUTROABS 11.0*  --  10.5*  --   HGB 7.7* 7.7* 7.4* 7.4*  HCT 22.2* 22.4* 21.4* 22.0*  MCV 83.8  --  88.4 87.0  PLT 501*  --  457* [id number]*   Basic Metabolic Panel:  Recent Labs Lab 03/22/16 0826 03/24/16 1003 03/27/16 0316 03/27/16 1419  NA 137 138 137 136  K 3.7 3.5 3.6 3.7  CL 107 107 109 107  CO2 24 24 21* 20*  GLUCOSE 111* 104* 106* 117*  BUN 10 11 10 10   CREATININE 0.77 0.77 0.77 0.85  CALCIUM 9.6 9.1 8.8* 8.6*   GFR: Estimated Creatinine Clearance: 131.9 mL/min (by C-G formula based on SCr of 0.85 mg/dL). Liver Function Tests:  Recent Labs Lab 03/24/16 1003 03/27/16 0316 03/27/16 1419  AST 39 37 40  ALT 28 30 33  ALKPHOS 99 92 91  BILITOT 5.0* 5.8* 5.8*  PROT 7.8 7.3 7.3  ALBUMIN 4.4 3.9 4.0   No results for input(s): LIPASE, AMYLASE in the last 168 hours. No results for input(s): AMMONIA in the last 168 hours. Coagulation Profile: No results for input(s): INR, PROTIME in the last 168 hours. Cardiac Enzymes: No results for input(s): CKTOTAL, CKMB, CKMBINDEX, TROPONINI in the last 168 hours. BNP (last 3 results) No results for input(s): PROBNP in the last 8760 hours. HbA1C: No results for input(s): HGBA1C in the last 72 hours. CBG: No results for input(s): GLUCAP in the last 168 hours. Lipid  Profile: No results for input(s): CHOL, HDL, LDLCALC, TRIG, CHOLHDL, LDLDIRECT in the last 72 hours. Thyroid Function Tests: No results for input(s): TSH, T4TOTAL, FREET4, T3FREE, THYROIDAB in the last 72 hours. Anemia Panel:  Recent Labs  03/27/16 0316 03/27/16 1419  RETICCTPCT 18.9* >23.0*   Urine analysis:    Component Value Date/Time   COLORURINE YELLOW 03/13/2016 1026   APPEARANCEUR CLEAR 03/13/2016 1026   LABSPEC 1.010 03/13/2016 1026   PHURINE 7.0 03/13/2016 1026   GLUCOSEU NEGATIVE 03/13/2016 1026   HGBUR SMALL (A) 03/13/2016 1026   BILIRUBINUR NEGATIVE 03/13/2016 1026   KETONESUR NEGATIVE 03/13/2016 1026   PROTEINUR NEGATIVE 03/13/2016 1026   UROBILINOGEN 0.2 03/24/2015 1426   NITRITE NEGATIVE 03/13/2016 1026   LEUKOCYTESUR NEGATIVE 03/13/2016 1026   Sepsis Labs: !!!!!!!!!!!!!!!!!!!!!!!!!!!!!!!!!!!!!!!!!!!! @LABRCNTIP (procalcitonin:4,lacticidven:4) )No results found for this or any previous visit (from the past 240 hour(s)).   Radiological Exams on Admission: Dg Chest 2 View  Result Date: 03/27/2016 CLINICAL DATA:  Left chest pain.  Sickle cell anemia EXAM: CHEST  2 VIEW COMPARISON:  03/22/2016 FINDINGS: Cardiac enlargement with vascular congestion. Negative for edema or effusion. Negative for pneumonia. Port-A-Cath tip in the SVC. IMPRESSION: Cardiac enlargement with pulmonary vascular congestion. Negative for pneumonia or edema. Electronically Signed   By: Franchot Gallo M.D.   On: 03/27/2016 16:02    EKG: None available for review  Assessment/Plan Principal Problem:   Sickle cell crisis ([place]) Active Problems:   Essential hypertension   Chronic respiratory failure with hypoxia (HCC)   PAH (pulmonary artery hypertension)   Sickle cell anemia (HCC)    Sickle cell crisis No evidence of no evidence of acute chest syndrome. Patient is chronically on oxygen. Patient has been going to the infusion clinic for treatments for the past week. -PCA -Continue MS  Contin -Ketorolac 30 mg every 6 hours -Continue hydroxyurea -Continue deferasirox  Essential hypertension Blood pressure is controlled -Continue carvedilol  Pulmonary artery hypertension Secondary to sickle cell disease -Continue ambrisentan  Chronic respiratory failure with hypoxia -Continue oxygen to keep O2 saturations greater than 80%  Chronic anticoagulation -Continue Seroquel to   DVT prophylaxis: Xarelto Code Status: Full code Family Communication: None at bedside Disposition  Plan: Discharge home in 2-3 days Consults called: None Admission status: Inpatient, medical floor   Cordelia Poche, MD [facility] Pager [phone]  If 7PM-7AM, please contact night-coverage [email or web address] Password [facility]  03/27/2016, 6:42 PM

## 2016-03-27 NOTE — ED Notes (Signed)
Attempted to get lab draw. Patient wants port accessed.

## 2016-03-27 NOTE — ED Notes (Signed)
Hospitalist at bedside 

## 2016-03-28 DIAGNOSIS — D5700 Hb-SS disease with crisis, unspecified: Principal | ICD-10-CM

## 2016-03-28 MED ORDER — HYDROMORPHONE HCL 2 MG/ML IJ SOLN
2.0000 mg | INTRAMUSCULAR | Status: DC | PRN
  Administered 2016-03-28 – 2016-03-29 (×7): 2 mg via INTRAVENOUS
  Filled 2016-03-28 (×7): qty 1

## 2016-03-28 NOTE — Progress Notes (Signed)
Subjective: A 36 year old gentleman admitted yesterday with sickle cell painful crisis. Patient is also having hypoxemia due to chronic pulmonary hypertension. He still complaining of his pain is 7 out of 10. Currently on Dilaudid PCA with Toradol and IV fluids was discontinued. He has used 34.85 mg of the Dilaudid with 58 demands and 48 deliveries in the last 24 hours. He is still complaining of pain. No fever or chills no cough no significant shortness of breath although his oxygen sats is hanging in the upper 80s to 90s on 5 L.  Objective: Vital signs in last 24 hours: Temp:  [97.8 F (36.6 C)-98.8 F (37.1 C)] 98.6 F (37 C) (02/26 1259) Pulse Rate:  [75-93] 81 (02/26 1259) Resp:  [12-25] 14 (02/26 1259) BP: (100-139)/(69-95) 125/82 (02/26 1259) SpO2:  [72 %-98 %] 87 % (02/26 1259) Weight:  [79.4 kg (175 lb)] 79.4 kg (175 lb) (02/26 0238) Weight change:  Last BM Date: 03/27/16  Intake/Output from previous day: 02/25 0701 - 02/26 0700 In: 720 [P.O.:720] Out: 300 [Urine:300] Intake/Output this shift: Total I/O In: 240 [P.O.:240] Out: 350 [Urine:350]  General appearance: alert, cooperative and no distress Neck: no adenopathy, no carotid bruit, no JVD, supple, symmetrical, trachea midline and thyroid not enlarged, symmetric, no tenderness/mass/nodules Back: symmetric, no curvature. ROM normal. No CVA tenderness. Resp: clear to auscultation bilaterally Cardio: regular rate and rhythm, S1, S2 normal, no murmur, click, rub or gallop GI: soft, non-tender; bowel sounds normal; no masses,  no organomegaly Extremities: extremities normal, atraumatic, no cyanosis or edema Pulses: 2+ and symmetric Lymph nodes: Cervical, supraclavicular, and axillary nodes normal.  Lab Results:  Recent Labs  03/27/16 0316 03/27/16 1419  WBC 15.5* 16.0*  HGB 7.4* 7.4*  HCT 21.4* 22.0*  PLT 457* 454*   BMET  Recent Labs  03/27/16 0316 03/27/16 1419  NA 137 136  K 3.6 3.7  CL 109 107  CO2  21* 20*  GLUCOSE 106* 117*  BUN 10 10  CREATININE 0.77 0.85  CALCIUM 8.8* 8.6*    Studies/Results: Dg Chest 2 View  Result Date: 03/27/2016 CLINICAL DATA:  Left chest pain.  Sickle cell anemia EXAM: CHEST  2 VIEW COMPARISON:  03/22/2016 FINDINGS: Cardiac enlargement with vascular congestion. Negative for edema or effusion. Negative for pneumonia. Port-A-Cath tip in the SVC. IMPRESSION: Cardiac enlargement with pulmonary vascular congestion. Negative for pneumonia or edema. Electronically Signed   By: Franchot Gallo M.D.   On: 03/27/2016 16:02    Medications: I have reviewed the patient's current medications.  Assessment/Plan: 36 old gentleman admitted with sickle cell painful crisis.  #1 sickle cell painful crisis: Patient appears to be doing fine on his current regimen. I will add 1 mg of Dilaudid every 4 hours as physician assisted dosing. Mobilize the patient.  #2 sickle cell anemia: He appears to be at his baseline. Continue monitoring.  #3 hypoxemia: This is chronic due to chronic pulmonary hypertension. Patient has been counseled I will follow closely  #4 chronic anticoagulation: Continue Xarelto  #5 medication noncompliance: Counseling provided.  LOS: 1 day   [name],[name] 03/28/2016, 1:39 PM

## 2016-03-29 DIAGNOSIS — J9621 Acute and chronic respiratory failure with hypoxia: Secondary | ICD-10-CM

## 2016-03-29 DIAGNOSIS — Z9114 Patient's other noncompliance with medication regimen: Secondary | ICD-10-CM

## 2016-03-29 DIAGNOSIS — D638 Anemia in other chronic diseases classified elsewhere: Secondary | ICD-10-CM

## 2016-03-29 DIAGNOSIS — D72829 Elevated white blood cell count, unspecified: Secondary | ICD-10-CM

## 2016-03-29 DIAGNOSIS — I10 Essential (primary) hypertension: Secondary | ICD-10-CM

## 2016-03-29 DIAGNOSIS — I2721 Secondary pulmonary arterial hypertension: Secondary | ICD-10-CM

## 2016-03-29 LAB — CBC WITH DIFFERENTIAL/PLATELET
BASOS ABS: 0.1 10*3/uL (ref 0.0–0.1)
BASOS PCT: 1 %
EOS PCT: 5 %
Eosinophils Absolute: 0.7 10*3/uL (ref 0.0–0.7)
HCT: 21.7 % — ABNORMAL LOW (ref 39.0–52.0)
HEMOGLOBIN: 7.5 g/dL — AB (ref 13.0–17.0)
LYMPHS ABS: 0.7 10*3/uL (ref 0.7–4.0)
LYMPHS PCT: 5 %
MCH: 30.9 pg (ref 26.0–34.0)
MCHC: 34.6 g/dL (ref 30.0–36.0)
MCV: 89.3 fL (ref 78.0–100.0)
MONOS PCT: 12 %
Monocytes Absolute: 1.7 10*3/uL — ABNORMAL HIGH (ref 0.1–1.0)
NEUTROS PCT: 77 %
Neutro Abs: 11 10*3/uL — ABNORMAL HIGH (ref 1.7–7.7)
Platelets: 412 10*3/uL — ABNORMAL HIGH (ref 150–400)
RBC: 2.43 MIL/uL — ABNORMAL LOW (ref 4.22–5.81)
RDW: 24.3 % — ABNORMAL HIGH (ref 11.5–15.5)
WBC: 14.2 10*3/uL — ABNORMAL HIGH (ref 4.0–10.5)
nRBC: 13 /100 WBC — ABNORMAL HIGH

## 2016-03-29 LAB — BASIC METABOLIC PANEL
Anion gap: 4 — ABNORMAL LOW (ref 5–15)
BUN: 12 mg/dL (ref 6–20)
CALCIUM: 8.5 mg/dL — AB (ref 8.9–10.3)
CO2: 24 mmol/L (ref 22–32)
CREATININE: 0.85 mg/dL (ref 0.61–1.24)
Chloride: 109 mmol/L (ref 101–111)
GFR calc Af Amer: >60 mL/min (ref >60)
GFR calc non Af Amer: >60 mL/min (ref >60)
Glucose, Bld: 102 mg/dL — ABNORMAL HIGH (ref 65–99)
Potassium: 4.6 mmol/L (ref 3.5–5.1)
SODIUM: 137 mmol/L (ref 135–145)

## 2016-03-29 LAB — HIV ANTIBODY (ROUTINE TESTING W REFLEX): HIV Screen 4th Generation wRfx: NONREACTIVE

## 2016-03-29 LAB — RETICULOCYTES: RBC.: 2.43 MIL/uL — AB (ref 4.22–5.81)

## 2016-03-29 MED ORDER — HYDROMORPHONE HCL 2 MG/ML IJ SOLN
2.0000 mg | INTRAMUSCULAR | Status: DC
  Administered 2016-03-29 (×3): 2 mg via INTRAVENOUS
  Filled 2016-03-29 (×3): qty 1

## 2016-03-29 MED ORDER — HYDROMORPHONE 1 MG/ML IV SOLN
INTRAVENOUS | Status: DC
  Administered 2016-03-29: 7 mg via INTRAVENOUS
  Administered 2016-03-29: 21:00:00 via INTRAVENOUS
  Administered 2016-03-29: 9.05 mg via INTRAVENOUS
  Administered 2016-03-30: 11.2 mg via INTRAVENOUS
  Administered 2016-03-30: 5.6 mg via INTRAVENOUS
  Administered 2016-03-30: 6.3 mg via INTRAVENOUS
  Administered 2016-03-30: 8.4 mg via INTRAVENOUS
  Administered 2016-03-30: 7 mg via INTRAVENOUS
  Administered 2016-03-30: 11:00:00 via INTRAVENOUS
  Administered 2016-03-30: 3.5 mg via INTRAVENOUS
  Administered 2016-03-31: 3.9 mg via INTRAVENOUS
  Administered 2016-03-31: 10.5 mg via INTRAVENOUS
  Administered 2016-03-31: 17:00:00 via INTRAVENOUS
  Administered 2016-03-31: 12.45 mg via INTRAVENOUS
  Administered 2016-03-31: 0 mg via INTRAVENOUS
  Administered 2016-03-31: 11.2 mg via INTRAVENOUS
  Administered 2016-04-01: 18.2 mg via INTRAVENOUS
  Administered 2016-04-01: 9.1 mg via INTRAVENOUS
  Administered 2016-04-01: 7.7 mg via INTRAVENOUS
  Administered 2016-04-01: 4.3 mg via INTRAVENOUS
  Administered 2016-04-01: 0.7 mL via INTRAVENOUS
  Administered 2016-04-01: 25 mg via INTRAVENOUS
  Administered 2016-04-01: 7 mg via INTRAVENOUS
  Administered 2016-04-01: 01:00:00 via INTRAVENOUS
  Administered 2016-04-02: 7 mg via INTRAVENOUS
  Administered 2016-04-02: 3.5 mg via INTRAVENOUS
  Administered 2016-04-02: 06:00:00 via INTRAVENOUS
  Administered 2016-04-02: 14 mg via INTRAVENOUS
  Administered 2016-04-02: 8.4 mg via INTRAVENOUS
  Filled 2016-03-29 (×7): qty 25

## 2016-03-29 NOTE — Progress Notes (Signed)
SICKLE CELL SERVICE PROGRESS NOTE  [name] [id number] DOB: 09/07/1980 DOA: 03/27/2016 PCP: [name], MD  Assessment/Plan: Principal Problem:   Sickle cell crisis ([place]) Active Problems:   Chronic respiratory failure with hypoxia ([place])   Essential hypertension   PAH (pulmonary artery hypertension)   Sickle cell anemia ([place])  1. Acute on Chronic Respiratory Failure with Hypoxia: Pt continues to have progressive worsening of respiratory function likely related to under-use of his supplemental Oxygen. He continues to have a pattern of increase in Oxygen requirements after being without Oxygen for an extended period of time.  Unfortunately I am sure that his pulmonary HTN is worsening as a result of prolonged Hypoxia and patient is still non-adherent to wearing Oxygen as prescribed. 2. Hb SS with crisis: Continue PCA dose to 0.7 mg and schedule clinician assisted doses. Continue Toradol. Decrease clinician assisted doses and schedule oral Dilaudid. 3. Hypoxia: CXR shows pulmonary vascular congestion. Will check BNP and give a dose of Lasix. Suspect acute pulmonary edema due to IV hydration. 4. Leukocytosis: Likely related to crisis.  5. Anemia of chronic disease:  Hb at baseline 6. Chronic pain: Continue MS Contin 7. Chronic Anticoagulation: Pt on Xarelto for recurrent PE's. Continue Xarelto.   Code Status: Full Code Family Communication: N/A Disposition Plan: Not yet ready for discharge  [place].  Pager [phone]. If 7PM-7AM, please contact night-coverage.  03/29/2016, 5:10 PM  LOS: 2 days   Interim History: Pt reports that he was without his Oxygen all day on Saturday and started having pain in his ribs on Saturday night into Sunday morning.This is a pattern that has been repeated several times in the last month. I personally have had multiple discussions about the impact of being without his Oxygen has on his Sickle Cell disease as well as on his Pulmonary  Arterial Hypertension. He has not had a recent red cell exchange and does not have an appointment scheduled.  He  reports pain at 9/10 and localized to ribs. He had a BM yesterday.  Consultants:  None  Procedures:  None  Antibiotics:  None  Objective: Vitals:   03/29/16 0946 03/29/16 1245 03/29/16 1406 03/29/16 1616  BP: 113/88  118/90   Pulse: 72  73   Resp: 15 14 20 19  Temp: 97.5 F (36.4 C)  97.5 F (36.4 C)   TempSrc: Axillary  Axillary   SpO2: 95% 94% 90% 91%  Weight:      Height:       Weight change:   Intake/Output Summary (Last 24 hours) at 03/29/16 1710 Last data filed at 03/29/16 1407  Gross per 24 hour  Intake              350 ml  Output             15 50 ml  Net            -1200 ml    General: Alert, awake, oriented x3, in moderately acute distress due to pain.  HEENT: [place]/AT PEERL, EOMI, anicteric Neck: Trachea midline,  no masses, no thyromegal,y no JVD, no carotid bruit OROPHARYNX:  Moist, No exudate/ erythema/lesions.  Heart: Regular rate and rhythm, II/VI systolic ejection murmurs at base, no rubs or gallops, PMI non-displaced, no heaves or thrills on palpation.  Lungs: Clear to auscultation except at b/l bases where there are mild crackles. No wheezing or rhonchi noted. No increased vocal fremitus resonant to percussion  Abdomen: Soft, nontender, nondistended, positive bowel sounds, no  masses no hepatosplenomegaly noted.  Neuro: No focal neurological deficits noted cranial nerves II through XII grossly intact. . Strength at functional baseline in bilateral upper and lower extremities. Musculoskeletal: No warmth swelling or erythema around joints, no spinal tenderness noted. Psychiatric: Patient alert and oriented x3, good insight and cognition, good recent to remote recall.    Data Reviewed: Basic Metabolic Panel:  Recent Labs Lab 03/24/16 1003 03/27/16 0316 03/27/16 1419 03/29/16 1405  NA 138 137 136 137  K 3.5 3.6 3.7 4.6  CL 107 109  107 109  CO2 24 21* 20* 24  GLUCOSE 104* 106* 117* 102*  BUN 11 10 10 12   CREATININE 0.77 0.77 0.85 0.85  CALCIUM 9.1 8.8* 8.6* 8.5*   Liver Function Tests:  Recent Labs Lab 03/24/16 1003 03/27/16 0316 03/27/16 1419  AST 39 37 40  ALT 28 30 33  ALKPHOS 99 92 91  BILITOT 5.0* 5.8* 5.8*  PROT 7.8 7.3 7.3  ALBUMIN 4.4 3.9 4.0   No results for input(s): LIPASE, AMYLASE in the last 168 hours. No results for input(s): AMMONIA in the last 168 hours. CBC:  Recent Labs Lab 03/24/16 1003 03/27/16 0316 03/27/16 1419 03/29/16 1405  WBC  --  15.5* 16.0* 14.2*  NEUTROABS  --  10.5*  --  11.0*  HGB 7.7* 7.4* 7.4* 7.5*  HCT 22.4* 21.4* 22.0* 21.7*  MCV  --  88.4 87.0 89.3  PLT  --  457* 454* 412*   Cardiac Enzymes: No results for input(s): CKTOTAL, CKMB, CKMBINDEX, TROPONINI in the last 168 hours. BNP (last 3 results)  Recent Labs  11/28/15 0520 02/11/16 1006 03/07/16 0535  BNP 422.5* 381.0* 319.6*    ProBNP (last 3 results) No results for input(s): PROBNP in the last 8760 hours.  CBG: No results for input(s): GLUCAP in the last 168 hours.  No results found for this or any previous visit (from the past 240 hour(s)).   Studies: Dg Chest 2 View  Result Date: 03/27/2016 CLINICAL DATA:  Left chest pain.  Sickle cell anemia EXAM: CHEST  2 VIEW COMPARISON:  03/22/2016 FINDINGS: Cardiac enlargement with vascular congestion. Negative for edema or effusion. Negative for pneumonia. Port-A-Cath tip in the SVC. IMPRESSION: Cardiac enlargement with pulmonary vascular congestion. Negative for pneumonia or edema. Electronically Signed   By: Franchot Gallo M.D.   On: 03/27/2016 16:02   Dg Chest 2 View  Result Date: 03/22/2016 CLINICAL DATA:  Chest/bilateral rib pain since last night. Sickle cell crisis. EXAM: CHEST  2 VIEW COMPARISON:  Chest CT 03/13/2016 and radiographs 03/11/2016 FINDINGS: Left subclavian Port-A-Cath remains in place, with tip overlying the lower SVC. Cardiac  silhouette remains moderately enlarged, unchanged. Mild diffuse chronic interstitial accentuation is unchanged. Focal scarring is noted in the right mid lung. There is no evidence of acute airspace consolidation, pulmonary edema, pleural effusion, or pneumothorax. No acute osseous abnormality is seen. IMPRESSION: No active cardiopulmonary disease. Electronically Signed   By: Logan Bores M.D.   On: 03/22/2016 08:18   Dg Chest 2 View  Result Date: 03/11/2016 CLINICAL DATA:  Sickle cell pain crisis with bilateral chest pain beginning this morning. EXAM: CHEST  2 VIEW COMPARISON:  03/06/2016 FINDINGS: Moderate cardiomegaly remains stable. Left-sided power port remains in appropriate position. Mild scarring and peripheral right midlung is unchanged. No evidence of acute pulmonary infiltrate or edema. No evidence of pleural effusion. IMPRESSION: Stable cardiomegaly.  No active lung disease. Electronically Signed   By: Sharrie Rothman.D.  On: 03/11/2016 08:55   Dg Chest 2 View  Result Date: 03/06/2016 CLINICAL DATA:  36 year old male with right rib pain and sickle cell crisis. Initial encounter. EXAM: CHEST  2 VIEW COMPARISON:  02/29/2016 and earlier. FINDINGS: Stable cardiomegaly and mediastinal contours. Stable left chest power port, currently accessed. Interval resolved patchy and nodular right upper lung opacity. No pneumothorax, pulmonary edema, pleural effusion or acute pulmonary opacity. Stable basilar predominant mild chronic increased interstitial markings. Negative visible bowel gas pattern. Stable cholecystectomy clips. No acute osseous abnormality identified. IMPRESSION: Interval resolved right upper lobe opacity. Stable cardiomegaly with no acute cardiopulmonary abnormality. Electronically Signed   By: Genevie Ann M.D.   On: 03/06/2016 10:46   Dg Chest 2 View  Result Date: 02/29/2016 CLINICAL DATA:  Hypoxia and weakness.  Sickle cell. EXAM: CHEST  2 VIEW COMPARISON:  02/23/2016. FINDINGS: Trachea is  midline. Left subclavian power port tip is in the high right atrium. Heart is enlarged, stable. Mild patchy right upper lobe airspace opacification. Streaky atelectasis or scarring in the left lower lobe. No pleural fluid. IMPRESSION: Mild right upper lobe patchy airspace opacification. Electronically Signed   By: Lorin Picket M.D.   On: 02/29/2016 12:53   Ct Angio Chest Pe W And/or Wo Contrast  Result Date: 03/13/2016 CLINICAL DATA:  History of sickle cell. Left-sided chest pain starting this morning about 3 a.m. EXAM: CT ANGIOGRAPHY CHEST WITH CONTRAST TECHNIQUE: Multidetector CT imaging of the chest was performed using the standard protocol during bolus administration of intravenous contrast. Multiplanar CT image reconstructions and MIPs were obtained to evaluate the vascular anatomy. CONTRAST:  100 mL Isovue 370 COMPARISON:  CT chest 03/06/2016, 02/21/2016, 01/05/2016 FINDINGS: Cardiovascular: Satisfactory opacification of the pulmonary arteries to the segmental level. No evidence of pulmonary embolism. Stable cardiomegaly. No pericardial effusion. Normal caliber thoracic aorta. No thoracic aortic atherosclerosis or dissection. Left-sided Port-A-Cath again noted. Mediastinum/Nodes: Thyroid gland, trachea, and esophagus demonstrate no significant findings. Multiple small prevascular lymph nodes unchanged compared with the prior exams. Mildly enlarged stable subcarinal lymph node. No axillary lymphadenopathy. Lungs/Pleura: Patchy areas of ground-glass opacities in bilateral upper lobes and lower lobes which may reflect mild pulmonary edema versus alveolitis secondary to an infectious or inflammatory etiology. No pleural effusion or pneumothorax. Upper Abdomen: No acute abnormality in the upper abdomen. Chronic calcified, involuted spleen. Musculoskeletal: No acute osseous abnormality. No lytic or sclerotic osseous lesion. Soft tissue: Bilateral retroareolar fibroglandular soft tissue as can be seen with  gynecomastia. Review of the MIP images confirms the above findings. IMPRESSION: 1. No evidence of acute pulmonary embolus. 2. Stable cardiomegaly. 3. Patchy areas of ground-glass opacities in bilateral upper lobes and lower lobes which may reflect mild pulmonary edema versus alveolitis secondary to an infectious or inflammatory etiology. Electronically Signed   By: Kathreen Devoid   On: 03/13/2016 10:13   Ct Angio Chest Pe W And/or Wo Contrast  Result Date: 03/06/2016 CLINICAL DATA:  Chest pain, new hypoxia.  Sickle cell disease. EXAM: CT ANGIOGRAPHY CHEST WITH CONTRAST TECHNIQUE: Multidetector CT imaging of the chest was performed using the standard protocol during bolus administration of intravenous contrast. Multiplanar CT image reconstructions and MIPs were obtained to evaluate the vascular anatomy. CONTRAST:  100 cc Isovue 370 Additional 80 cc Isovue 370 given due to port leakage and patient motion. COMPARISON:  Chest CT angiogram dated 02/21/2016. FINDINGS: Cardiovascular: Despite repeat imaging, the peripheral segmental and subsegmental pulmonary arteries to the lower lobes are difficult to definitively characterize due to patient motion  artifact and suboptimal contrast opacification. There is, however, no pulmonary embolism identified within the main, lobar or central segmental pulmonary arteries bilaterally. Patient has had chronic lower lobe subsegmental pulmonary emboli described on previous exams, not able to be delineated again on today's study due to the patient motion artifact and suboptimal contrast opacification. Thoracic aorta is normal in caliber and configuration. Heart is prominently enlarged, stable compared to the previous exam. No pericardial effusion seen. Mediastinum/Nodes: Numerous small and moderate-sized lymph nodes within the anterior mediastinum are stable. Normal residual thymic tissue is also noted within the anterior mediastinum. Esophagus appears normal. Trachea and central bronchi  are unremarkable. Lungs/Pleura: Mild bibasilar scarring/atelectasis is stable. Lungs otherwise clear. No evidence of pneumonia. No pleural effusion or pneumothorax. Upper Abdomen: Limited images of the upper abdomen are unremarkable. Musculoskeletal: No acute or suspicious osseous lesion. Superficial soft tissues are unremarkable. Review of the MIP images confirms the above findings. IMPRESSION: 1. No acute pulmonary embolus identified, with mild study limitations detailed above. 2. Stable cardiomegaly. 3. No pneumonia or pulmonary edema. Stable mild scarring/fibrosis at the lung bases. 4. Stable mediastinal lymphadenopathy. Electronically Signed   By: Franki Cabot M.D.   On: 03/06/2016 13:30    Scheduled Meds: . ambrisentan  5 mg Oral Daily  . aspirin  81 mg Oral Daily  . carvedilol  3.125 mg Oral BID WC  . cholecalciferol  2,000 Units Oral Daily  . Deferasirox  1,080 mg Oral Daily  . folic acid  1 mg Oral Daily  . gabapentin  300 mg Oral TID  . HYDROmorphone   Intravenous Q4H  . hydroxyurea  1,000 mg Oral Daily  . ketorolac  30 mg Intravenous Q6H  . morphine  30 mg Oral Q12H  . rivaroxaban  20 mg Oral QHS  . senna-docusate  1 tablet Oral BID   Continuous Infusions:   Principal Problem:   Sickle cell crisis ([facility]) Active Problems:   Chronic respiratory failure with hypoxia ([facility])   Essential hypertension   PAH (pulmonary artery hypertension)   Sickle cell anemia ([facility])    In excess of 25 minutes spent during this visit. Greater than 50% involved face to face contact with the patient for assessment, counseling and coordination of care.

## 2016-03-30 DIAGNOSIS — Z7901 Long term (current) use of anticoagulants: Secondary | ICD-10-CM

## 2016-03-30 DIAGNOSIS — R0781 Pleurodynia: Secondary | ICD-10-CM

## 2016-03-30 DIAGNOSIS — D649 Anemia, unspecified: Secondary | ICD-10-CM

## 2016-03-30 DIAGNOSIS — I2781 Cor pulmonale (chronic): Secondary | ICD-10-CM

## 2016-03-30 LAB — BRAIN NATRIURETIC PEPTIDE: B Natriuretic Peptide: 974.3 pg/mL — ABNORMAL HIGH (ref 0.0–100.0)

## 2016-03-30 MED ORDER — TORSEMIDE 20 MG PO TABS
20.0000 mg | ORAL_TABLET | Freq: Every day | ORAL | Status: DC
  Administered 2016-03-30 – 2016-04-01 (×3): 20 mg via ORAL
  Filled 2016-03-30 (×3): qty 1

## 2016-03-30 MED ORDER — PROCHLORPERAZINE MALEATE 10 MG PO TABS: 10.0000 mg | ORAL_TABLET | Freq: Four times a day (QID) | ORAL | Status: DC | PRN

## 2016-03-30 MED ORDER — HYDROMORPHONE HCL 4 MG/ML IJ SOLN
2.0000 mg | INTRAMUSCULAR | Status: DC
  Administered 2016-03-30 – 2016-03-31 (×18): 2 mg via INTRAVENOUS
  Filled 2016-03-30 (×18): qty 1

## 2016-03-30 MED ORDER — MUSCLE RUB 10-15 % EX CREA
TOPICAL_CREAM | CUTANEOUS | Status: DC | PRN
Start: 2016-03-30 — End: 2016-04-02
  Filled 2016-03-30: qty 85

## 2016-03-30 MED ORDER — SODIUM CHLORIDE 0.9% FLUSH: 10.0000 mL | INTRAVENOUS | Status: DC | PRN

## 2016-03-30 NOTE — Progress Notes (Signed)
SICKLE CELL SERVICE PROGRESS NOTE  [name] [id number] DOB: Dec 19, 1980 DOA: 03/27/2016 PCP: Gerald Chessman, MD  Assessment/Plan: Principal Problem:   Hb-SS disease with crisis ([place]) Active Problems:   Cor pulmonale, chronic (HCC)   Chronic anticoagulation   Rib pain   Essential hypertension   PAH (pulmonary artery hypertension)   Sickle cell anemia (HCC)   Acute on chronic respiratory failure with hypoxia (HCC)   Chronic anemia   Slurred speech  1. Acute on Chronic Respiratory Failure with Hypoxia: Pt continues to have progressive worsening of respiratory function likely related to under-use of his supplemental Oxygen. He continues to have a pattern of increase in Oxygen requirements after being without Oxygen for an extended period of time.  Unfortunately I am sure that his pulmonary HTN is worsening as a result of prolonged Hypoxia and patient is still non-adherent to wearing Oxygen as prescribed. 2. Hb SS with crisis: Continue PCA dose to 0.7 mg and schedule clinician assisted doses. Continue Toradol. Decrease clinician assisted doses and schedule oral Dilaudid. 3. Acute pulmonary edema with Hypoxia: Resume Torsemide and repeat CXR tomorrow. 4. Leukocytosis: Likely related to crisis. No clinical evidence of infection.  5. Anemia of chronic disease:  Hb at baseline. 6. Constipation: Pt advised to request Miralax as he has not had a BM in several days. 7. Chronic pain: Continue MS Contin 8. Chronic Anticoagulation: Pt on Xarelto for recurrent PE's. Continue Xarelto.   Code Status: Full Code Family Communication: N/A Disposition Plan: Not yet ready for discharge  [name].  Pager [phone]. If 7PM-7AM, please contact night-coverage.  03/30/2016, 9:51 AM  LOS: 3 days   Interim History: Pt reports that he was without his Oxygen all day on Saturday and started having pain in his ribs on Saturday night into Sunday morning.This is a pattern that has been  repeated several times in the last month. I have had multiple discussions about the impact of being without his Oxygen has on his Sickle Cell disease as well as on his Pulmonary Arterial Hypertension. He has not had a recent red cell exchange and does not have an appointment scheduled.  He reports that his Primary Provider ([name]) is arranging follow up appointments for him. He reports that currently pain at 7/10 and localized to ribs. He has used 48.95 mg on the PCA plus 12 mg of Dilaudid in clinician assisted doses with 74/70:demands/deliveries in the last 24 hours.  He has not had a BM in several days.  Consultants:  None  Procedures:  None  Antibiotics:  None  Objective: Vitals:   03/30/16 0334 03/30/16 0531 03/30/16 0757 03/30/16 0943  BP:  118/75  138/81  Pulse:  85  86  Resp: (!) 22 16 (!) 24 18  Temp:  98.3 F (36.8 C)  98.3 F (36.8 C)  TempSrc:  Oral  Oral  SpO2: (!) 88% (!) 85% 92% 92%  Weight:      Height:       Weight change:   Intake/Output Summary (Last 24 hours) at 03/30/16 0951 Last data filed at 03/30/16 0800  Gross per 24 hour  Intake              350 ml  Output             31 00 ml  Net            -2750 ml    General: Alert, awake, oriented x3, in acute distress due to pain. However appears  more comfortable than yesterday. HEENT: [place]/AT PEERL, EOMI, anicteric Heart: Regular rate and rhythm, II/VI systolic ejection murmurs at base, no rubs or gallops, PMI non-displaced, no heaves or thrills on palpation.  Lungs: Clear to auscultation except at b/l bases where there are mild crackles. No wheezing or rhonchi noted. No increased vocal fremitus resonant to percussion  Abdomen: Soft, nontender, nondistended, positive bowel sounds, no masses no hepatosplenomegaly noted.  Neuro: No focal neurological deficits noted cranial nerves II through XII grossly intact. . Strength at functional baseline in bilateral upper and lower extremities. Musculoskeletal: No  warmth swelling or erythema around joints, no spinal tenderness noted. Psychiatric: Patient alert and oriented x3, good insight and cognition, good recent to remote recall.    Data Reviewed: Basic Metabolic Panel:  Recent Labs Lab 03/24/16 1003 03/27/16 0316 03/27/16 1419 03/29/16 1405  NA 138 137 136 137  K 3.5 3.6 3.7 4.6  CL 107 109 107 109  CO2 24 21* 20* 24  GLUCOSE 104* 106* 117* 102*  BUN 11 10 10 12   CREATININE 0.77 0.77 0.85 0.85  CALCIUM 9.1 8.8* 8.6* 8.5*   Liver Function Tests:  Recent Labs Lab 03/24/16 1003 03/27/16 0316 03/27/16 1419  AST 39 37 40  ALT 28 30 33  ALKPHOS 99 92 91  BILITOT 5.0* 5.8* 5.8*  PROT 7.8 7.3 7.3  ALBUMIN 4.4 3.9 4.0   No results for input(s): LIPASE, AMYLASE in the last 168 hours. No results for input(s): AMMONIA in the last 168 hours. CBC:  Recent Labs Lab 03/24/16 1003 03/27/16 0316 03/27/16 1419 03/29/16 1405  WBC  --  15.5* 16.0* 14.2*  NEUTROABS  --  10.5*  --  11.0*  HGB 7.7* 7.4* 7.4* 7.5*  HCT 22.4* 21.4* 22.0* 21.7*  MCV  --  88.4 87.0 89.3  PLT  --  457* 454* 412*   Cardiac Enzymes: No results for input(s): CKTOTAL, CKMB, CKMBINDEX, TROPONINI in the last 168 hours. BNP (last 3 results)  Recent Labs  02/11/16 1006 03/07/16 0535 03/30/16 0800  BNP 381.0* 319.6* 974.3*    ProBNP (last 3 results) No results for input(s): PROBNP in the last 8760 hours.  CBG: No results for input(s): GLUCAP in the last 168 hours.  No results found for this or any previous visit (from the past 240 hour(s)).   Studies: Dg Chest 2 View  Result Date: 03/27/2016 CLINICAL DATA:  Left chest pain.  Sickle cell anemia EXAM: CHEST  2 VIEW COMPARISON:  03/22/2016 FINDINGS: Cardiac enlargement with vascular congestion. Negative for edema or effusion. Negative for pneumonia. Port-A-Cath tip in the SVC. IMPRESSION: Cardiac enlargement with pulmonary vascular congestion. Negative for pneumonia or edema. Electronically Signed    By: Franchot Gallo M.D.   On: 03/27/2016 16:02   Dg Chest 2 View  Result Date: 03/22/2016 CLINICAL DATA:  Chest/bilateral rib pain since last night. Sickle cell crisis. EXAM: CHEST  2 VIEW COMPARISON:  Chest CT 03/13/2016 and radiographs 03/11/2016 FINDINGS: Left subclavian Port-A-Cath remains in place, with tip overlying the lower SVC. Cardiac silhouette remains moderately enlarged, unchanged. Mild diffuse chronic interstitial accentuation is unchanged. Focal scarring is noted in the right mid lung. There is no evidence of acute airspace consolidation, pulmonary edema, pleural effusion, or pneumothorax. No acute osseous abnormality is seen. IMPRESSION: No active cardiopulmonary disease. Electronically Signed   By: Logan Bores M.D.   On: 03/22/2016 08:18   Dg Chest 2 View  Result Date: 03/11/2016 CLINICAL DATA:  Sickle cell pain crisis  with bilateral chest pain beginning this morning. EXAM: CHEST  2 VIEW COMPARISON:  03/06/2016 FINDINGS: Moderate cardiomegaly remains stable. Left-sided power port remains in appropriate position. Mild scarring and peripheral right midlung is unchanged. No evidence of acute pulmonary infiltrate or edema. No evidence of pleural effusion. IMPRESSION: Stable cardiomegaly.  No active lung disease. Electronically Signed   By: Earle Gell M.D.   On: 03/11/2016 08:55   Dg Chest 2 View  Result Date: 03/06/2016 CLINICAL DATA:  36 year old male with right rib pain and sickle cell crisis. Initial encounter. EXAM: CHEST  2 VIEW COMPARISON:  02/29/2016 and earlier. FINDINGS: Stable cardiomegaly and mediastinal contours. Stable left chest power port, currently accessed. Interval resolved patchy and nodular right upper lung opacity. No pneumothorax, pulmonary edema, pleural effusion or acute pulmonary opacity. Stable basilar predominant mild chronic increased interstitial markings. Negative visible bowel gas pattern. Stable cholecystectomy clips. No acute osseous abnormality identified.  IMPRESSION: Interval resolved right upper lobe opacity. Stable cardiomegaly with no acute cardiopulmonary abnormality. Electronically Signed   By: Genevie Ann M.D.   On: 03/06/2016 10:46   Dg Chest 2 View  Result Date: 02/29/2016 CLINICAL DATA:  Hypoxia and weakness.  Sickle cell. EXAM: CHEST  2 VIEW COMPARISON:  02/23/2016. FINDINGS: Trachea is midline. Left subclavian power port tip is in the high right atrium. Heart is enlarged, stable. Mild patchy right upper lobe airspace opacification. Streaky atelectasis or scarring in the left lower lobe. No pleural fluid. IMPRESSION: Mild right upper lobe patchy airspace opacification. Electronically Signed   By: Lorin Picket M.D.   On: 02/29/2016 12:53   Ct Angio Chest Pe W And/or Wo Contrast  Result Date: 03/13/2016 CLINICAL DATA:  History of sickle cell. Left-sided chest pain starting this morning about 3 a.m. EXAM: CT ANGIOGRAPHY CHEST WITH CONTRAST TECHNIQUE: Multidetector CT imaging of the chest was performed using the standard protocol during bolus administration of intravenous contrast. Multiplanar CT image reconstructions and MIPs were obtained to evaluate the vascular anatomy. CONTRAST:  100 mL Isovue 370 COMPARISON:  CT chest 03/06/2016, 02/21/2016, 01/05/2016 FINDINGS: Cardiovascular: Satisfactory opacification of the pulmonary arteries to the segmental level. No evidence of pulmonary embolism. Stable cardiomegaly. No pericardial effusion. Normal caliber thoracic aorta. No thoracic aortic atherosclerosis or dissection. Left-sided Port-A-Cath again noted. Mediastinum/Nodes: Thyroid gland, trachea, and esophagus demonstrate no significant findings. Multiple small prevascular lymph nodes unchanged compared with the prior exams. Mildly enlarged stable subcarinal lymph node. No axillary lymphadenopathy. Lungs/Pleura: Patchy areas of ground-glass opacities in bilateral upper lobes and lower lobes which may reflect mild pulmonary edema versus alveolitis  secondary to an infectious or inflammatory etiology. No pleural effusion or pneumothorax. Upper Abdomen: No acute abnormality in the upper abdomen. Chronic calcified, involuted spleen. Musculoskeletal: No acute osseous abnormality. No lytic or sclerotic osseous lesion. Soft tissue: Bilateral retroareolar fibroglandular soft tissue as can be seen with gynecomastia. Review of the MIP images confirms the above findings. IMPRESSION: 1. No evidence of acute pulmonary embolus. 2. Stable cardiomegaly. 3. Patchy areas of ground-glass opacities in bilateral upper lobes and lower lobes which may reflect mild pulmonary edema versus alveolitis secondary to an infectious or inflammatory etiology. Electronically Signed   By: Kathreen Devoid   On: 03/13/2016 10:13   Ct Angio Chest Pe W And/or Wo Contrast  Result Date: 03/06/2016 CLINICAL DATA:  Chest pain, new hypoxia.  Sickle cell disease. EXAM: CT ANGIOGRAPHY CHEST WITH CONTRAST TECHNIQUE: Multidetector CT imaging of the chest was performed using the standard protocol during bolus administration  of intravenous contrast. Multiplanar CT image reconstructions and MIPs were obtained to evaluate the vascular anatomy. CONTRAST:  100 cc Isovue 370 Additional 80 cc Isovue 370 given due to port leakage and patient motion. COMPARISON:  Chest CT angiogram dated 02/21/2016. FINDINGS: Cardiovascular: Despite repeat imaging, the peripheral segmental and subsegmental pulmonary arteries to the lower lobes are difficult to definitively characterize due to patient motion artifact and suboptimal contrast opacification. There is, however, no pulmonary embolism identified within the main, lobar or central segmental pulmonary arteries bilaterally. Patient has had chronic lower lobe subsegmental pulmonary emboli described on previous exams, not able to be delineated again on today's study due to the patient motion artifact and suboptimal contrast opacification. Thoracic aorta is normal in caliber and  configuration. Heart is prominently enlarged, stable compared to the previous exam. No pericardial effusion seen. Mediastinum/Nodes: Numerous small and moderate-sized lymph nodes within the anterior mediastinum are stable. Normal residual thymic tissue is also noted within the anterior mediastinum. Esophagus appears normal. Trachea and central bronchi are unremarkable. Lungs/Pleura: Mild bibasilar scarring/atelectasis is stable. Lungs otherwise clear. No evidence of pneumonia. No pleural effusion or pneumothorax. Upper Abdomen: Limited images of the upper abdomen are unremarkable. Musculoskeletal: No acute or suspicious osseous lesion. Superficial soft tissues are unremarkable. Review of the MIP images confirms the above findings. IMPRESSION: 1. No acute pulmonary embolus identified, with mild study limitations detailed above. 2. Stable cardiomegaly. 3. No pneumonia or pulmonary edema. Stable mild scarring/fibrosis at the lung bases. 4. Stable mediastinal lymphadenopathy. Electronically Signed   By: Franki Cabot M.D.   On: 03/06/2016 13:30    Scheduled Meds: . ambrisentan  5 mg Oral Daily  . aspirin  81 mg Oral Daily  . carvedilol  3.125 mg Oral BID WC  . cholecalciferol  2,000 Units Oral Daily  . Deferasirox  1,080 mg Oral Daily  . folic acid  1 mg Oral Daily  . gabapentin  300 mg Oral TID  . HYDROmorphone   Intravenous Q4H  .  HYDROmorphone (DILAUDID) injection  2 mg Intravenous Q2H  . hydroxyurea  1,000 mg Oral Daily  . ketorolac  30 mg Intravenous Q6H  . morphine  30 mg Oral Q12H  . rivaroxaban  20 mg Oral QHS  . senna-docusate  1 tablet Oral BID  . torsemide  20 mg Oral Daily   Continuous Infusions:   Principal Problem:   Hb-SS disease with crisis ([place]) Active Problems:   Cor pulmonale, chronic (HCC)   Chronic anticoagulation   Rib pain   Essential hypertension   PAH (pulmonary artery hypertension)   Sickle cell anemia (HCC)   Acute on chronic respiratory failure with hypoxia  (HCC)   Chronic anemia   Slurred speech    In excess of 25 minutes spent during this visit. Greater than 50% involved face to face contact with the patient for assessment, counseling and coordination of care.

## 2016-03-31 DIAGNOSIS — J810 Acute pulmonary edema: Secondary | ICD-10-CM

## 2016-03-31 LAB — CBC WITH DIFFERENTIAL/PLATELET
BASOS ABS: 0.1 10*3/uL (ref 0.0–0.1)
Basophils Relative: 1 %
EOS ABS: 0.7 10*3/uL (ref 0.0–0.7)
Eosinophils Relative: 5 %
HCT: 19 % — ABNORMAL LOW (ref 39.0–52.0)
HEMOGLOBIN: 6.7 g/dL — AB (ref 13.0–17.0)
LYMPHS PCT: 9 %
Lymphs Abs: 1.2 10*3/uL (ref 0.7–4.0)
MCH: 30.6 pg (ref 26.0–34.0)
MCHC: 35.3 g/dL (ref 30.0–36.0)
MCV: 86.8 fL (ref 78.0–100.0)
MONOS PCT: 20 %
Monocytes Absolute: 2.8 10*3/uL — ABNORMAL HIGH (ref 0.1–1.0)
NEUTROS ABS: 9 10*3/uL — AB (ref 1.7–7.7)
NEUTROS PCT: 65 %
PLATELETS: 349 10*3/uL (ref 150–400)
RBC: 2.19 MIL/uL — ABNORMAL LOW (ref 4.22–5.81)
RDW: 22.8 % — ABNORMAL HIGH (ref 11.5–15.5)
WBC: 13.8 10*3/uL — ABNORMAL HIGH (ref 4.0–10.5)

## 2016-03-31 LAB — BASIC METABOLIC PANEL
ANION GAP: 5 (ref 5–15)
BUN: 17 mg/dL (ref 6–20)
CO2: 25 mmol/L (ref 22–32)
Calcium: 8.8 mg/dL — ABNORMAL LOW (ref 8.9–10.3)
Chloride: 110 mmol/L (ref 101–111)
Creatinine, Ser: 0.88 mg/dL (ref 0.61–1.24)
GLUCOSE: 112 mg/dL — AB (ref 65–99)
POTASSIUM: 4.2 mmol/L (ref 3.5–5.1)
Sodium: 140 mmol/L (ref 135–145)

## 2016-03-31 LAB — RETICULOCYTES
RBC.: 2.19 MIL/uL — ABNORMAL LOW (ref 4.22–5.81)
RETIC CT PCT: 15.6 % — AB (ref 0.4–3.1)
Retic Count, Absolute: 341.6 10*3/uL — ABNORMAL HIGH (ref 19.0–186.0)

## 2016-03-31 LAB — BRAIN NATRIURETIC PEPTIDE: B NATRIURETIC PEPTIDE 5: 697 pg/mL — AB (ref 0.0–100.0)

## 2016-03-31 MED ORDER — HYDROMORPHONE HCL 4 MG PO TABS
4.0000 mg | ORAL_TABLET | ORAL | Status: DC
  Administered 2016-03-31 – 2016-04-01 (×5): 4 mg via ORAL
  Filled 2016-03-31 (×6): qty 1

## 2016-03-31 MED ORDER — HYDROMORPHONE HCL 2 MG/ML IJ SOLN
2.0000 mg | INTRAMUSCULAR | Status: DC | PRN
  Administered 2016-03-31: 2 mg via INTRAVENOUS
  Filled 2016-03-31: qty 1

## 2016-03-31 MED ORDER — HYDROMORPHONE HCL 4 MG/ML IJ SOLN
2.0000 mg | INTRAMUSCULAR | Status: DC | PRN
  Administered 2016-03-31 – 2016-04-01 (×5): 2 mg via INTRAVENOUS
  Filled 2016-03-31 (×5): qty 1

## 2016-03-31 NOTE — Progress Notes (Signed)
SICKLE CELL SERVICE PROGRESS NOTE  [name] [id number] DOB: Sep 08, 1980 DOA: 03/27/2016 PCP: [name], MD  Assessment/Plan: Principal Problem:   Hb-SS disease with crisis ([place]) Active Problems:   Cor pulmonale, chronic (HCC)   Chronic anticoagulation   Rib pain   Essential hypertension   PAH (pulmonary artery hypertension)   Sickle cell anemia (HCC)   Acute on chronic respiratory failure with hypoxia (HCC)   Chronic anemia   Slurred speech  1. Acute Pulmonary Edema with Hypoxia: Oxygenation improving with diuresis and weights decreasing. BNP also improved since yesterday. Continue Torsemide. Continue to wean Oxygen for saturations >87%.  2. Acute on Chronic Respiratory Failure with Hypoxia: Pt continues to have progressive worsening of respiratory function likely related to under-use of his supplemental Oxygen. He continues to have a pattern of increase in Oxygen requirements after being without Oxygen for an extended period of time.  Unfortunately I am sure that his pulmonary HTN is worsening as a result of prolonged Hypoxia and patient is still non-adherent to wearing Oxygen as prescribed. 3. Hb SS with crisis: Continue PCA dose to 0.7 mg and schedule oral dilaudid. Change clinician assisted doses to every 3 hours as needed. Continue Toradol.  4. Leukocytosis: Likely related to crisis. No clinical evidence of infection.  5. Anemia of chronic disease:  Hb at baseline. 6. Constipation: Pt advised to request Miralax as he has not had a BM in several days. 7. Chronic pain: Continue MS Contin 8. Chronic Anticoagulation: Pt on Xarelto for recurrent PE's. Continue Xarelto.   Code Status: Full Code Family Communication: N/A Disposition Plan: Anticipate discharge home in 48 hours.   [name] A.  Pager [phone]. If 7PM-7AM, please contact night-coverage.  03/31/2016, 12:16 PM  LOS: 4 days   Interim History: Pt reports that he was without his Oxygen all day on  Saturday and started having pain in his ribs on Saturday night into Sunday morning.This is a pattern that has been repeated several times in the last month. I have had multiple discussions about the impact of being without his Oxygen has on his Sickle Cell disease as well as on his Pulmonary Arterial Hypertension. He has not had a recent red cell exchange and does not have an appointment scheduled.  He reports that his Primary Provider (Ms. Hollis) is arranging follow up appointments for him. He reports that currently pain at 7/10 and localized to ribs. He has used 35.7 mg on the PCA plus 24 mg of Dilaudid in clinician assisted doses with 76/51:demands/deliveries in the last 24 hours.  He had a BM yesterday.  Consultants:  None  Procedures:  None  Antibiotics:  None  Objective: Vitals:   03/31/16 0330 03/31/16 0416 03/31/16 0821 03/31/16 1034  BP:  (!) 132/100  (!) 121/97  Pulse:  98  88  Resp: 20 18 17 16  Temp:  98.2 F (36.8 C)  99 F (37.2 C)  TempSrc:  Oral  Oral  SpO2: (!) 88% (!) 88% 91% 92%  Weight:      Height:       Weight change:   Intake/Output Summary (Last 24 hours) at 03/31/16 1216 Last data filed at 03/31/16 0620  Gross per 24 hour  Intake             1152 ml  Output             27 75 ml  Net            -1623 ml  General: Alert, awake, oriented x3, in no apparent distress. HEENT: [place]/AT PEERL, EOMI, anicteric Heart: Regular rate and rhythm, II/VI systolic ejection murmurs at base, no rubs or gallops, PMI non-displaced, no heaves or thrills on palpation.  Lungs: Clear to auscultation except at b/l bases where there are mild crackles. No wheezing or rhonchi noted. No increased vocal fremitus resonant to percussion  Abdomen: Soft, nontender, nondistended, positive bowel sounds, no masses no hepatosplenomegaly noted.  Neuro: No focal neurological deficits noted cranial nerves II through XII grossly intact. . Strength at functional baseline in bilateral upper and  lower extremities. Musculoskeletal: No warmth swelling or erythema around joints, no spinal tenderness noted. Psychiatric: Patient alert and oriented x3, good insight and cognition, good recent to remote recall.    Data Reviewed: Basic Metabolic Panel:  Recent Labs Lab 03/27/16 0316 03/27/16 1419 03/29/16 1405 03/31/16 0600  NA 137 136 137 140  K 3.6 3.7 4.6 4.2  CL 109 107 109 110  CO2 21* 20* 24 25  GLUCOSE 106* 117* 102* 112*  BUN 10 10 12 17   CREATININE 0.77 0.85 0.85 0.88  CALCIUM 8.8* 8.6* 8.5* 8.8*   Liver Function Tests:  Recent Labs Lab 03/27/16 0316 03/27/16 1419  AST 37 40  ALT 30 33  ALKPHOS 92 91  BILITOT 5.8* 5.8*  PROT 7.3 7.3  ALBUMIN 3.9 4.0   No results for input(s): LIPASE, AMYLASE in the last 168 hours. No results for input(s): AMMONIA in the last 168 hours. CBC:  Recent Labs Lab 03/27/16 0316 03/27/16 1419 03/29/16 1405 03/31/16 0600  WBC 15.5* 16.0* 14.2* 13.8*  NEUTROABS 10.5*  --  11.0* 9.0*  HGB 7.4* 7.4* 7.5* 6.7*  HCT 21.4* 22.0* 21.7* 19.0*  MCV 88.4 87.0 89.3 86.8  PLT 457* 454* 412* 349   Cardiac Enzymes: No results for input(s): CKTOTAL, CKMB, CKMBINDEX, TROPONINI in the last 168 hours. BNP (last 3 results)  Recent Labs  03/07/16 0535 03/30/16 0800 03/31/16 0600  BNP 319.6* 974.3* 697.0*    ProBNP (last 3 results) No results for input(s): PROBNP in the last 8760 hours.  CBG: No results for input(s): GLUCAP in the last 168 hours.  No results found for this or any previous visit (from the past 240 hour(s)).   Studies: Dg Chest 2 View  Result Date: 03/27/2016 CLINICAL DATA:  Left chest pain.  Sickle cell anemia EXAM: CHEST  2 VIEW COMPARISON:  03/22/2016 FINDINGS: Cardiac enlargement with vascular congestion. Negative for edema or effusion. Negative for pneumonia. Port-A-Cath tip in the SVC. IMPRESSION: Cardiac enlargement with pulmonary vascular congestion. Negative for pneumonia or edema. Electronically Signed    By: Franchot Gallo M.D.   On: 03/27/2016 16:02   Dg Chest 2 View  Result Date: 03/22/2016 CLINICAL DATA:  Chest/bilateral rib pain since last night. Sickle cell crisis. EXAM: CHEST  2 VIEW COMPARISON:  Chest CT 03/13/2016 and radiographs 03/11/2016 FINDINGS: Left subclavian Port-A-Cath remains in place, with tip overlying the lower SVC. Cardiac silhouette remains moderately enlarged, unchanged. Mild diffuse chronic interstitial accentuation is unchanged. Focal scarring is noted in the right mid lung. There is no evidence of acute airspace consolidation, pulmonary edema, pleural effusion, or pneumothorax. No acute osseous abnormality is seen. IMPRESSION: No active cardiopulmonary disease. Electronically Signed   By: Logan Bores M.D.   On: 03/22/2016 08:18   Dg Chest 2 View  Result Date: 03/11/2016 CLINICAL DATA:  Sickle cell pain crisis with bilateral chest pain beginning this morning. EXAM: CHEST  2  VIEW COMPARISON:  03/06/2016 FINDINGS: Moderate cardiomegaly remains stable. Left-sided power port remains in appropriate position. Mild scarring and peripheral right midlung is unchanged. No evidence of acute pulmonary infiltrate or edema. No evidence of pleural effusion. IMPRESSION: Stable cardiomegaly.  No active lung disease. Electronically Signed   By: Earle Gell M.D.   On: 03/11/2016 08:55   Dg Chest 2 View  Result Date: 03/06/2016 CLINICAL DATA:  36 year old male with right rib pain and sickle cell crisis. Initial encounter. EXAM: CHEST  2 VIEW COMPARISON:  02/29/2016 and earlier. FINDINGS: Stable cardiomegaly and mediastinal contours. Stable left chest power port, currently accessed. Interval resolved patchy and nodular right upper lung opacity. No pneumothorax, pulmonary edema, pleural effusion or acute pulmonary opacity. Stable basilar predominant mild chronic increased interstitial markings. Negative visible bowel gas pattern. Stable cholecystectomy clips. No acute osseous abnormality identified.  IMPRESSION: Interval resolved right upper lobe opacity. Stable cardiomegaly with no acute cardiopulmonary abnormality. Electronically Signed   By: Genevie Ann M.D.   On: 03/06/2016 10:46   Ct Angio Chest Pe W And/or Wo Contrast  Result Date: 03/13/2016 CLINICAL DATA:  History of sickle cell. Left-sided chest pain starting this morning about 3 a.m. EXAM: CT ANGIOGRAPHY CHEST WITH CONTRAST TECHNIQUE: Multidetector CT imaging of the chest was performed using the standard protocol during bolus administration of intravenous contrast. Multiplanar CT image reconstructions and MIPs were obtained to evaluate the vascular anatomy. CONTRAST:  100 mL Isovue 370 COMPARISON:  CT chest 03/06/2016, 02/21/2016, 01/05/2016 FINDINGS: Cardiovascular: Satisfactory opacification of the pulmonary arteries to the segmental level. No evidence of pulmonary embolism. Stable cardiomegaly. No pericardial effusion. Normal caliber thoracic aorta. No thoracic aortic atherosclerosis or dissection. Left-sided Port-A-Cath again noted. Mediastinum/Nodes: Thyroid gland, trachea, and esophagus demonstrate no significant findings. Multiple small prevascular lymph nodes unchanged compared with the prior exams. Mildly enlarged stable subcarinal lymph node. No axillary lymphadenopathy. Lungs/Pleura: Patchy areas of ground-glass opacities in bilateral upper lobes and lower lobes which may reflect mild pulmonary edema versus alveolitis secondary to an infectious or inflammatory etiology. No pleural effusion or pneumothorax. Upper Abdomen: No acute abnormality in the upper abdomen. Chronic calcified, involuted spleen. Musculoskeletal: No acute osseous abnormality. No lytic or sclerotic osseous lesion. Soft tissue: Bilateral retroareolar fibroglandular soft tissue as can be seen with gynecomastia. Review of the MIP images confirms the above findings. IMPRESSION: 1. No evidence of acute pulmonary embolus. 2. Stable cardiomegaly. 3. Patchy areas of ground-glass  opacities in bilateral upper lobes and lower lobes which may reflect mild pulmonary edema versus alveolitis secondary to an infectious or inflammatory etiology. Electronically Signed   By: Kathreen Devoid   On: 03/13/2016 10:13   Ct Angio Chest Pe W And/or Wo Contrast  Result Date: 03/06/2016 CLINICAL DATA:  Chest pain, new hypoxia.  Sickle cell disease. EXAM: CT ANGIOGRAPHY CHEST WITH CONTRAST TECHNIQUE: Multidetector CT imaging of the chest was performed using the standard protocol during bolus administration of intravenous contrast. Multiplanar CT image reconstructions and MIPs were obtained to evaluate the vascular anatomy. CONTRAST:  100 cc Isovue 370 Additional 80 cc Isovue 370 given due to port leakage and patient motion. COMPARISON:  Chest CT angiogram dated 02/21/2016. FINDINGS: Cardiovascular: Despite repeat imaging, the peripheral segmental and subsegmental pulmonary arteries to the lower lobes are difficult to definitively characterize due to patient motion artifact and suboptimal contrast opacification. There is, however, no pulmonary embolism identified within the main, lobar or central segmental pulmonary arteries bilaterally. Patient has had chronic lower lobe subsegmental pulmonary emboli  described on previous exams, not able to be delineated again on today's study due to the patient motion artifact and suboptimal contrast opacification. Thoracic aorta is normal in caliber and configuration. Heart is prominently enlarged, stable compared to the previous exam. No pericardial effusion seen. Mediastinum/Nodes: Numerous small and moderate-sized lymph nodes within the anterior mediastinum are stable. Normal residual thymic tissue is also noted within the anterior mediastinum. Esophagus appears normal. Trachea and central bronchi are unremarkable. Lungs/Pleura: Mild bibasilar scarring/atelectasis is stable. Lungs otherwise clear. No evidence of pneumonia. No pleural effusion or pneumothorax. Upper  Abdomen: Limited images of the upper abdomen are unremarkable. Musculoskeletal: No acute or suspicious osseous lesion. Superficial soft tissues are unremarkable. Review of the MIP images confirms the above findings. IMPRESSION: 1. No acute pulmonary embolus identified, with mild study limitations detailed above. 2. Stable cardiomegaly. 3. No pneumonia or pulmonary edema. Stable mild scarring/fibrosis at the lung bases. 4. Stable mediastinal lymphadenopathy. Electronically Signed   By: Franki Cabot M.D.   On: 03/06/2016 13:30    Scheduled Meds: . ambrisentan  5 mg Oral Daily  . aspirin  81 mg Oral Daily  . carvedilol  3.125 mg Oral BID WC  . cholecalciferol  2,000 Units Oral Daily  . Deferasirox  1,080 mg Oral Daily  . folic acid  1 mg Oral Daily  . gabapentin  300 mg Oral TID  . HYDROmorphone   Intravenous Q4H  . HYDROmorphone  4 mg Oral Q4H  . hydroxyurea  1,000 mg Oral Daily  . ketorolac  30 mg Intravenous Q6H  . morphine  30 mg Oral Q12H  . rivaroxaban  20 mg Oral QHS  . senna-docusate  1 tablet Oral BID  . torsemide  20 mg Oral Daily   Continuous Infusions:   Principal Problem:   Hb-SS disease with crisis ([place]) Active Problems:   Cor pulmonale, chronic (HCC)   Chronic anticoagulation   Rib pain   Essential hypertension   PAH (pulmonary artery hypertension)   Sickle cell anemia (HCC)   Acute on chronic respiratory failure with hypoxia (HCC)   Chronic anemia   Slurred speech    In excess of 25 minutes spent during this visit. Greater than 50% involved face to face contact with the patient for assessment, counseling and coordination of care.

## 2016-03-31 NOTE — Care Management Important Message (Signed)
Important Message  Patient Details  Name: [name] MRN: [id number] Date of Birth: 04/01/1980   Medicare Important Message Given:  Yes    Kerin Salen 03/31/2016, 10:43 AMImportant Message  Patient Details  Name: [name] MRN: [id number] Date of Birth: [birth year]   Medicare Important Message Given:  Yes    Kerin Salen 03/31/2016, 10:42 AM

## 2016-04-01 LAB — BASIC METABOLIC PANEL
Anion gap: 5 (ref 5–15)
BUN: 21 mg/dL — ABNORMAL HIGH (ref 6–20)
CALCIUM: 8.6 mg/dL — AB (ref 8.9–10.3)
CO2: 26 mmol/L (ref 22–32)
Chloride: 110 mmol/L (ref 101–111)
Creatinine, Ser: 1 mg/dL (ref 0.61–1.24)
Glucose, Bld: 118 mg/dL — ABNORMAL HIGH (ref 65–99)
Potassium: 4.2 mmol/L (ref 3.5–5.1)
SODIUM: 141 mmol/L (ref 135–145)

## 2016-04-01 MED ORDER — HYDROMORPHONE HCL 4 MG PO TABS
8.0000 mg | ORAL_TABLET | ORAL | Status: DC
  Administered 2016-04-01 – 2016-04-02 (×5): 8 mg via ORAL
  Filled 2016-04-01 (×5): qty 2

## 2016-04-01 NOTE — Progress Notes (Signed)
SICKLE CELL SERVICE PROGRESS NOTE  QUAMAIN CZARNECKI [id number] DOB: 05-12-1980 DOA: 03/27/2016 PCP: [name], MD  Assessment/Plan: Principal Problem:   Hb-SS disease with crisis ([place]) Active Problems:   Cor pulmonale, chronic (HCC)   Chronic anticoagulation   Rib pain   Essential hypertension   PAH (pulmonary artery hypertension)   Sickle cell anemia (HCC)   Acute on chronic respiratory failure with hypoxia (HCC)   Chronic anemia   Slurred speech  1. Acute Pulmonary Edema with Hypoxia: Oxygenation improving with diuresis and weights decreasing. Pt now requiring 2 L/min at rest. Will check saturations on 3 l/min with ambulation.  Discontinue Torsemide today as Cr now approaching high end of normal.  Continue to wean Oxygen for saturations >87%.  2. Acute on Chronic Respiratory Failure with Hypoxia: Pt continues to have progressive worsening of respiratory function likely related to under-use of his supplemental Oxygen. He continues to have a pattern of increase in Oxygen requirements after being without Oxygen for an extended period of time.  Unfortunately I am sure that his pulmonary HTN is worsening as a result of prolonged Hypoxia and patient is still non-adherent to wearing Oxygen as prescribed. 3. Hb SS with crisis: Continue PCA dose to 0.7 mg and increase oral dilaudid to 8 mg. [place] clinician assisted doses. Continue Toradol.  4. Leukocytosis: Likely related to crisis. No clinical evidence of infection.  5. Anemia of chronic disease:  Hb at baseline. 6. Constipation: Pt advised to request Miralax as he has not had a BM in several days. 7. Chronic pain: Continue MS Contin 8. Chronic Anticoagulation: Pt on Xarelto for recurrent PE's. Continue Xarelto.   Code Status: Full Code Family Communication: N/A Disposition Plan: Anticipate discharge home tomorrow.   Dashon Mcintire A.  Pager [phone]. If 7PM-7AM, please contact night-coverage.  04/01/2016, 11:52 AM   LOS: 5 days   Interim History: Pt reports that he was without his Oxygen all day on Saturday and started having pain in his ribs on Saturday night into Sunday morning.This is a pattern that has been repeated several times in the last month. I have had multiple discussions about the impact of being without his Oxygen has on his Sickle Cell disease as well as on his Pulmonary Arterial Hypertension. He has not had a recent red cell exchange and does not have an appointment scheduled.  He reports that his Primary Provider (Ms. Hollis) is arranging follow up appointments for him. He reports that currently pain at 6/10 and localized to ribs and legs He has used 44.5 mg on the PCA plus 14 mg of Dilaudid in clinician assisted doses with 64/64:demands/deliveries in the last 24 hours.  He had a BM yesterday.  Consultants:  None  Procedures:  None  Antibiotics:  None  Objective: Vitals:   04/01/16 1010 04/01/16 1053 04/01/16 1055 04/01/16 1130  BP:      Pulse:      Resp:  16    Temp:      TempSrc:      SpO2: 95% 93% 94% (!) 89%  Weight:      Height:       Weight change: -1.905 kg (-4 lb 3.2 oz)  Intake/Output Summary (Last 24 hours) at 04/01/16 1152 Last data filed at 04/01/16 1111  Gross per 24 hour  Intake             1440 ml  Output             39 85 ml  Net            -  2545 ml    General: Alert, awake, oriented x3, in no apparent distress. HEENT: [place]/AT PEERL, EOMI, anicteric Heart: Regular rate and rhythm, II/VI systolic ejection murmurs at base, no rubs or gallops, PMI non-displaced, no heaves or thrills on palpation.  Lungs: Clear to auscultation. No wheezing or rhonchi noted. No increased vocal fremitus resonant to percussion  Abdomen: Soft, nontender, nondistended, positive bowel sounds, no masses no hepatosplenomegaly noted.  Neuro: No focal neurological deficits noted cranial nerves II through XII grossly intact. . Strength at functional baseline in bilateral upper and lower  extremities. Musculoskeletal: No warmth swelling or erythema around joints, no spinal tenderness noted. Psychiatric: Patient alert and oriented x3, good insight and cognition, good recent to remote recall.    Data Reviewed: Basic Metabolic Panel:  Recent Labs Lab 03/27/16 0316 03/27/16 1419 03/29/16 1405 03/31/16 0600 04/01/16 0500  NA 137 136 137 140 141  K 3.6 3.7 4.6 4.2 4.2  CL 109 107 109 110 110  CO2 21* 20* 24 25 26   GLUCOSE 106* 117* 102* 112* 118*  BUN 10 10 12 17  21*  CREATININE 0.77 0.85 0.85 0.88 1.00  CALCIUM 8.8* 8.6* 8.5* 8.8* 8.6*   Liver Function Tests:  Recent Labs Lab 03/27/16 0316 03/27/16 1419  AST 37 40  ALT 30 33  ALKPHOS 92 91  BILITOT 5.8* 5.8*  PROT 7.3 7.3  ALBUMIN 3.9 4.0   No results for input(s): LIPASE, AMYLASE in the last 168 hours. No results for input(s): AMMONIA in the last 168 hours. CBC:  Recent Labs Lab 03/27/16 0316 03/27/16 1419 03/29/16 1405 03/31/16 0600  WBC 15.5* 16.0* 14.2* 13.8*  NEUTROABS 10.5*  --  11.0* 9.0*  HGB 7.4* 7.4* 7.5* 6.7*  HCT 21.4* 22.0* 21.7* 19.0*  MCV 88.4 87.0 89.3 86.8  PLT 457* 454* 412* 349   Cardiac Enzymes: No results for input(s): CKTOTAL, CKMB, CKMBINDEX, TROPONINI in the last 168 hours. BNP (last 3 results)  Recent Labs  03/07/16 0535 03/30/16 0800 03/31/16 0600  BNP 319.6* 974.3* 697.0*    ProBNP (last 3 results) No results for input(s): PROBNP in the last 8760 hours.  CBG: No results for input(s): GLUCAP in the last 168 hours.  No results found for this or any previous visit (from the past 240 hour(s)).   Studies: Dg Chest 2 View  Result Date: 03/27/2016 CLINICAL DATA:  Left chest pain.  Sickle cell anemia EXAM: CHEST  2 VIEW COMPARISON:  03/22/2016 FINDINGS: Cardiac enlargement with vascular congestion. Negative for edema or effusion. Negative for pneumonia. Port-A-Cath tip in the SVC. IMPRESSION: Cardiac enlargement with pulmonary vascular congestion. Negative for  pneumonia or edema. Electronically Signed   By: Franchot Gallo M.D.   On: 03/27/2016 16:02   Dg Chest 2 View  Result Date: 03/22/2016 CLINICAL DATA:  Chest/bilateral rib pain since last night. Sickle cell crisis. EXAM: CHEST  2 VIEW COMPARISON:  Chest CT 03/13/2016 and radiographs 03/11/2016 FINDINGS: Left subclavian Port-A-Cath remains in place, with tip overlying the lower SVC. Cardiac silhouette remains moderately enlarged, unchanged. Mild diffuse chronic interstitial accentuation is unchanged. Focal scarring is noted in the right mid lung. There is no evidence of acute airspace consolidation, pulmonary edema, pleural effusion, or pneumothorax. No acute osseous abnormality is seen. IMPRESSION: No active cardiopulmonary disease. Electronically Signed   By: Logan Bores M.D.   On: 03/22/2016 08:18   Dg Chest 2 View  Result Date: 03/11/2016 CLINICAL DATA:  Sickle cell pain crisis with bilateral chest pain beginning  this morning. EXAM: CHEST  2 VIEW COMPARISON:  03/06/2016 FINDINGS: Moderate cardiomegaly remains stable. Left-sided power port remains in appropriate position. Mild scarring and peripheral right midlung is unchanged. No evidence of acute pulmonary infiltrate or edema. No evidence of pleural effusion. IMPRESSION: Stable cardiomegaly.  No active lung disease. Electronically Signed   By: Earle Gell M.D.   On: 03/11/2016 08:55   Dg Chest 2 View  Result Date: 03/06/2016 CLINICAL DATA:  36 year old male with right rib pain and sickle cell crisis. Initial encounter. EXAM: CHEST  2 VIEW COMPARISON:  02/29/2016 and earlier. FINDINGS: Stable cardiomegaly and mediastinal contours. Stable left chest power port, currently accessed. Interval resolved patchy and nodular right upper lung opacity. No pneumothorax, pulmonary edema, pleural effusion or acute pulmonary opacity. Stable basilar predominant mild chronic increased interstitial markings. Negative visible bowel gas pattern. Stable cholecystectomy  clips. No acute osseous abnormality identified. IMPRESSION: Interval resolved right upper lobe opacity. Stable cardiomegaly with no acute cardiopulmonary abnormality. Electronically Signed   By: Genevie Ann M.D.   On: 03/06/2016 10:46   Ct Angio Chest Pe W And/or Wo Contrast  Result Date: 03/13/2016 CLINICAL DATA:  History of sickle cell. Left-sided chest pain starting this morning about 3 a.m. EXAM: CT ANGIOGRAPHY CHEST WITH CONTRAST TECHNIQUE: Multidetector CT imaging of the chest was performed using the standard protocol during bolus administration of intravenous contrast. Multiplanar CT image reconstructions and MIPs were obtained to evaluate the vascular anatomy. CONTRAST:  100 mL Isovue 370 COMPARISON:  CT chest 03/06/2016, 02/21/2016, 01/05/2016 FINDINGS: Cardiovascular: Satisfactory opacification of the pulmonary arteries to the segmental level. No evidence of pulmonary embolism. Stable cardiomegaly. No pericardial effusion. Normal caliber thoracic aorta. No thoracic aortic atherosclerosis or dissection. Left-sided Port-A-Cath again noted. Mediastinum/Nodes: Thyroid gland, trachea, and esophagus demonstrate no significant findings. Multiple small prevascular lymph nodes unchanged compared with the prior exams. Mildly enlarged stable subcarinal lymph node. No axillary lymphadenopathy. Lungs/Pleura: Patchy areas of ground-glass opacities in bilateral upper lobes and lower lobes which may reflect mild pulmonary edema versus alveolitis secondary to an infectious or inflammatory etiology. No pleural effusion or pneumothorax. Upper Abdomen: No acute abnormality in the upper abdomen. Chronic calcified, involuted spleen. Musculoskeletal: No acute osseous abnormality. No lytic or sclerotic osseous lesion. Soft tissue: Bilateral retroareolar fibroglandular soft tissue as can be seen with gynecomastia. Review of the MIP images confirms the above findings. IMPRESSION: 1. No evidence of acute pulmonary embolus. 2. Stable  cardiomegaly. 3. Patchy areas of ground-glass opacities in bilateral upper lobes and lower lobes which may reflect mild pulmonary edema versus alveolitis secondary to an infectious or inflammatory etiology. Electronically Signed   By: Kathreen Devoid   On: 03/13/2016 10:13   Ct Angio Chest Pe W And/or Wo Contrast  Result Date: 03/06/2016 CLINICAL DATA:  Chest pain, new hypoxia.  Sickle cell disease. EXAM: CT ANGIOGRAPHY CHEST WITH CONTRAST TECHNIQUE: Multidetector CT imaging of the chest was performed using the standard protocol during bolus administration of intravenous contrast. Multiplanar CT image reconstructions and MIPs were obtained to evaluate the vascular anatomy. CONTRAST:  100 cc Isovue 370 Additional 80 cc Isovue 370 given due to port leakage and patient motion. COMPARISON:  Chest CT angiogram dated 02/21/2016. FINDINGS: Cardiovascular: Despite repeat imaging, the peripheral segmental and subsegmental pulmonary arteries to the lower lobes are difficult to definitively characterize due to patient motion artifact and suboptimal contrast opacification. There is, however, no pulmonary embolism identified within the main, lobar or central segmental pulmonary arteries bilaterally. Patient has had  chronic lower lobe subsegmental pulmonary emboli described on previous exams, not able to be delineated again on today's study due to the patient motion artifact and suboptimal contrast opacification. Thoracic aorta is normal in caliber and configuration. Heart is prominently enlarged, stable compared to the previous exam. No pericardial effusion seen. Mediastinum/Nodes: Numerous small and moderate-sized lymph nodes within the anterior mediastinum are stable. Normal residual thymic tissue is also noted within the anterior mediastinum. Esophagus appears normal. Trachea and central bronchi are unremarkable. Lungs/Pleura: Mild bibasilar scarring/atelectasis is stable. Lungs otherwise clear. No evidence of pneumonia. No  pleural effusion or pneumothorax. Upper Abdomen: Limited images of the upper abdomen are unremarkable. Musculoskeletal: No acute or suspicious osseous lesion. Superficial soft tissues are unremarkable. Review of the MIP images confirms the above findings. IMPRESSION: 1. No acute pulmonary embolus identified, with mild study limitations detailed above. 2. Stable cardiomegaly. 3. No pneumonia or pulmonary edema. Stable mild scarring/fibrosis at the lung bases. 4. Stable mediastinal lymphadenopathy. Electronically Signed   By: Franki Cabot M.D.   On: 03/06/2016 13:30    Scheduled Meds: . ambrisentan  5 mg Oral Daily  . aspirin  81 mg Oral Daily  . carvedilol  3.125 mg Oral BID WC  . cholecalciferol  2,000 Units Oral Daily  . Deferasirox  1,080 mg Oral Daily  . folic acid  1 mg Oral Daily  . gabapentin  300 mg Oral TID  . HYDROmorphone   Intravenous Q4H  . HYDROmorphone  8 mg Oral Q4H  . hydroxyurea  1,000 mg Oral Daily  . ketorolac  30 mg Intravenous Q6H  . morphine  30 mg Oral Q12H  . rivaroxaban  20 mg Oral QHS  . senna-docusate  1 tablet Oral BID   Continuous Infusions:   Principal Problem:   Hb-SS disease with crisis ([place]) Active Problems:   Cor pulmonale, chronic (HCC)   Chronic anticoagulation   Rib pain   Essential hypertension   PAH (pulmonary artery hypertension)   Sickle cell anemia (HCC)   Acute on chronic respiratory failure with hypoxia (HCC)   Chronic anemia   Slurred speech    In excess of 25 minutes spent during this visit. Greater than 50% involved face to face contact with the patient for assessment, counseling and coordination of care.

## 2016-04-01 NOTE — Progress Notes (Signed)
Patient educated on the importance of deep breathing and the use of Incentive spirometer patient on 5L of oxygen nasal cannula sats at 90%, desats with exertion, falling asleep during tasks, and during conversation, labs collected this am, PCA infusing, no acute events.

## 2016-04-01 NOTE — Care Management Note (Signed)
Case Management Note  Patient Details  Name: [name] MRN: [id number] Date of Birth: 1980-05-20  Subjective/Objective:   36 yo admitted with SCC.                 Action/Plan: From home with family. Pt on home 02 from [facility]. CM following along for DC needs.  Expected Discharge Date:                  Expected Discharge Plan:  Home/Self Care  In-House Referral:     Discharge planning Services  CM Consult  Post Acute Care Choice:    Choice offered to:     DME Arranged:    DME Agency:  [place]:    [facility]:     Status of Service:  In process, will continue to follow  If discussed at Long Length of Stay Meetings, dates discussed:    Additional CommentsLynnell Catalan, RN 04/01/2016, 1:27 PM  [phone]

## 2016-04-02 DIAGNOSIS — R4781 Slurred speech: Secondary | ICD-10-CM

## 2016-04-02 LAB — BASIC METABOLIC PANEL
Anion gap: 4 — ABNORMAL LOW (ref 5–15)
BUN: 17 mg/dL (ref 6–20)
CALCIUM: 8.8 mg/dL — AB (ref 8.9–10.3)
CO2: 27 mmol/L (ref 22–32)
CREATININE: 0.96 mg/dL (ref 0.61–1.24)
Chloride: 110 mmol/L (ref 101–111)
Glucose, Bld: 136 mg/dL — ABNORMAL HIGH (ref 65–99)
Potassium: 4 mmol/L (ref 3.5–5.1)
Sodium: 141 mmol/L (ref 135–145)

## 2016-04-02 LAB — CBC WITH DIFFERENTIAL/PLATELET
BASOS ABS: 0.1 10*3/uL (ref 0.0–0.1)
Basophils Relative: 1 %
EOS ABS: 0.8 10*3/uL — AB (ref 0.0–0.7)
Eosinophils Relative: 6 %
HEMATOCRIT: 18.9 % — AB (ref 39.0–52.0)
HEMOGLOBIN: 6.7 g/dL — AB (ref 13.0–17.0)
LYMPHS PCT: 12 %
Lymphs Abs: 1.6 10*3/uL (ref 0.7–4.0)
MCH: 30.3 pg (ref 26.0–34.0)
MCHC: 35.4 g/dL (ref 30.0–36.0)
MCV: 85.5 fL (ref 78.0–100.0)
Monocytes Absolute: 2.4 10*3/uL — ABNORMAL HIGH (ref 0.1–1.0)
Monocytes Relative: 18 %
NEUTROS ABS: 8.5 10*3/uL — AB (ref 1.7–7.7)
NEUTROS PCT: 63 %
Platelets: 390 10*3/uL (ref 150–400)
RBC: 2.21 MIL/uL — AB (ref 4.22–5.81)
RDW: 21.7 % — ABNORMAL HIGH (ref 11.5–15.5)
WBC: 13.4 10*3/uL — AB (ref 4.0–10.5)

## 2016-04-02 LAB — RETICULOCYTES
RBC.: 2.21 MIL/uL — AB (ref 4.22–5.81)
RETIC COUNT ABSOLUTE: 112.7 10*3/uL (ref 19.0–186.0)
RETIC CT PCT: 5.1 % — AB (ref 0.4–3.1)

## 2016-04-02 LAB — BRAIN NATRIURETIC PEPTIDE: B NATRIURETIC PEPTIDE 5: 559.5 pg/mL — AB (ref 0.0–100.0)

## 2016-04-02 MED ORDER — HEPARIN SOD (PORK) LOCK FLUSH 100 UNIT/ML IV SOLN
500.0000 [IU] | INTRAVENOUS | Status: AC | PRN
  Administered 2016-04-02: 500 [IU]
  Filled 2016-04-02: qty 5

## 2016-04-02 NOTE — Progress Notes (Signed)
Remains on PCA, on 3L nasal cannula currently, incentive spirometer encouraged and the importance of the use discussed with patient, urine output adequate, resting during the shift, no acute events.

## 2016-04-02 NOTE — Discharge Instructions (Signed)
Sickle Cell Anemia, Adult °Sickle cell anemia is a condition in which red blood cells have an abnormal “sickle” shape. This abnormal shape shortens the cells’ life span, which results in a lower than normal concentration of red blood cells in the blood. The sickle shape also causes the cells to clump together and block free blood flow through the blood vessels. As a result, the tissues and organs of the body do not receive enough oxygen. Sickle cell anemia causes organ damage and pain and increases the risk of infection. °What are the causes? °Sickle cell anemia is a genetic disorder. Those who receive two copies of the gene have the condition, and those who receive one copy have the trait. °What increases the risk? °The sickle cell gene is most common in people whose families originated in [place]. Other areas of the globe where sickle cell trait occurs include the [place], [place], the [place], and the [place]. °What are the signs or symptoms? °· Pain, especially in the extremities, back, chest, or abdomen (common). The pain may start suddenly or may develop following an illness, especially if there is dehydration. Pain can also occur due to overexertion or exposure to extreme temperature changes. °· Frequent severe bacterial infections, especially certain types of pneumonia and meningitis. °· Pain and swelling in the hands and feet. °· Decreased activity. °· Loss of appetite. °· Change in behavior. °· Headaches. °· Seizures. °· Shortness of breath or difficulty breathing. °· Vision changes. °· Skin ulcers. °Those with the trait may not have symptoms or they may have mild symptoms. °How is this diagnosed? °Sickle cell anemia is diagnosed with blood tests that demonstrate the genetic trait. It is often diagnosed during the newborn period, due to mandatory testing nationwide. A variety of blood tests, X-rays, CT scans, MRI scans, ultrasounds, and lung function tests may also be done to  monitor the condition. °How is this treated? °Sickle cell anemia may be treated with: °· Medicines. You may be given pain medicines, antibiotic medicines (to treat and prevent infections) or medicines to increase the production of certain types of hemoglobin. °· Fluids. °· Oxygen. °· Blood transfusions. ° °Follow these instructions at home: °· Drink enough fluid to keep your urine clear or pale yellow. Increase your fluid intake in hot weather and during exercise. °· Do not smoke. Smoking lowers oxygen levels in the blood. °· Only take over-the-counter or prescription medicines for pain, fever, or discomfort as directed by your health care provider. °· Take antibiotics as directed by your health care provider. Make sure you finish them it even if you start to feel better. °· Take supplements as directed by your health care provider. °· Consider wearing a medical alert bracelet. This tells anyone caring for you in an emergency of your condition. °· When traveling, keep your medical information, health care provider's names, and the medicines you take with you at all times. °· If you develop a fever, do not take medicines to reduce the fever right away. This could cover up a problem that is developing. Notify your health care provider. °· Keep all follow-up appointments with your health care provider. Sickle cell anemia requires regular medical care. °Contact a health care provider if: °You have a fever. °Get help right away if: °· You feel dizzy or faint. °· You have new abdominal pain, especially on the left side near the stomach area. °· You develop a persistent, often uncomfortable and painful penile erection (priapism). If this is not   immediately it will lead to impotence.  You have numbness your arms or legs or you have a hard time moving them.  You have a hard time with speech.  You have a fever or persistent symptoms for more than 2-3 days.  You have a fever and your symptoms suddenly get  worse.  You have signs or symptoms of infection. These include:  Chills.  Abnormal tiredness (lethargy).  Irritability.  Poor eating.  Vomiting.  You develop pain that is not helped with medicine.  You develop shortness of breath.  You have pain in your chest.  You are coughing up pus-like or bloody sputum.  You develop a stiff neck.  Your feet or hands swell or have pain.  Your abdomen appears bloated.  You develop joint pain. This information is not intended to replace advice given to you by your health care provider. Make sure you discuss any questions you have with your health care provider. Document Released: 04/27/2005 Document Revised: 08/07/2015 Document Reviewed: 08/29/2012 Elsevier Interactive Patient Education  2017 [employer] Oxygen Use, Adult When a medical condition keeps you from getting enough oxygen, your health care provider may instruct you to take extra oxygen at home. Your health care provider will let you know:  When to take oxygen.  For how long to take oxygen.  How quickly oxygen should be delivered (flow rate), in liters per minute (LPM or L/M). Home oxygen can be given through:  A mask.  A nasal cannula. This is a device or tube that goes in the nostrils.  A transtracheal catheter. This is a small, flexible tube placed in the trachea.  A tracheostomy. This is a surgically made opening in the trachea. These devices are connected with tubing to an oxygen source, such as:  A tank. Tanks hold oxygen in gas form. They must be replaced when the oxygen is used up.  A liquid oxygen device. This holds oxygen in liquid form. It must be replaced when the oxygen is used up.  An oxygen concentrator machine. This filters oxygen in the room. It uses electricity, so you must have a backup cylinder of oxygen in case the power goes out. Supplies needed: To use oxygen, you will need:  A mask, nasal cannula, transtracheal catheter, or  tracheostomy.  An oxygen tank, a liquid oxygen device, or an oxygen concentrator.  The tape that your health care provider recommends (optional). If you use a transtracheal catheter and your prescribed flow rate is 1 LPM or greater, you will also need a humidifier. Risks and complications  Fire. This can happen if the oxygen is exposed to a heat source, flame, or spark.  Injury to skin. This can happen if liquid oxygen touches your skin.  Organ damage. This can happen if you get too little oxygen. How to use oxygen Your health care provider will show you how to use your oxygen device. Follow her or his instructions. They may look something like this: 1. Wash your hands. 2. If you use an oxygen concentrator, make sure it is plugged in. 3. Place one end of the tube into the port on the tank, device, or machine. 4. Place the mask over your nose and mouth. Or, place the nasal cannula and secure it with tape if instructed. If you use a tracheostomy or transtracheal catheter, connect it to the oxygen source as directed. 5. Make sure the liter-flow setting on the machine is at the level prescribed by your health care provider. 6.  Turn on the machine or adjust the knob on the tank or device to the correct liter-flow setting. 7. When you are done, turn off and unplug the machine, or turn the knob to OFF. How to clean and care for the oxygen supplies Nasal cannula   Clean it with a warm, wet cloth daily or as needed.  Wash it with a liquid soap once a week.  Rinse it thoroughly once or twice a week.  Replace it every 2-4 weeks.  If you have an infection, such as a cold or pneumonia, change the cannula when you get better. Mask   Replace it every 2-4 weeks.  If you have an infection, such as a cold or pneumonia, change the mask when you get better. Humidifier bottle   Wash the bottle between each refill:  Wash it with soap and warm water.  Rinse it thoroughly.  Disinfect it and  its top.  Air-dry it.  Make sure it is dry before you refill it. Oxygen concentrator   Clean the air filter at least twice a week according to directions from your home medical equipment and service company.  Wipe down the cabinet every day. To do this:  Unplug the unit.  Wipe down the cabinet with a damp cloth.  Dry the cabinet. Other equipment   Change any extra tubing every 1-3 months.  Follow instructions from your health care provider about taking care of any other equipment. Safety tips Fire safety tips    Keep your oxygen and oxygen supplies at least 5 ft away from sources of heat, flames, and sparks at all times.  Do not allow smoking near your oxygen. Put up "no smoking" signs in your home.  Do not use materials that can burn (are flammable) while you use oxygen.  When you go to a restaurant with portable oxygen, ask to be seated in the nonsmoking section.  Keep a Data processing manager close by. Let your fire department know that you have oxygen in your home.  Test your home smoke detectors regularly. General safety tips   If you use an oxygen cylinder, make sure it is in a stand or secured to an object that will not move (fixed object).  If you use liquid oxygen, make sure its container is kept upright.  If you use an oxygen concentrator:  Tell Loss adjuster, chartered company. Make sure you are given priority service in the event that your power goes out.  Avoid using extension cords, if possible. Follow these instructions at home:  Use oxygen only as told by your health care provider.  Do not use alcohol or other drugs that make you relax (sedating drugs) unless instructed. They can slow down your breathing rate and make it hard to get in enough oxygen.  Know how and when to order a refill of oxygen.  Always keep a spare tank of oxygen. Plan ahead for holidays when you may not be able to get a prescription filled.  Use water-based lubricants on your lips or  nostrils. Do not use oil-based products like petroleum jelly.  To prevent skin irritation on your cheeks or behind your ears, tuck some gauze under the tubing. Contact a health care provider if:  You get headaches often.  You have shortness of breath.  You have a lasting cough.  You have anxiety.  You are sleepy all the time.  You develop an illness that affects your breathing.  You cannot exercise at your regular level.  You are  restless.  You have difficult or irregular breathing, and it is getting worse.  You have a fever.  You have persistent redness under your nose. Get help right away if:  You are confused.  You have blue lips or fingernails.  You are struggling to breathe. This information is not intended to replace advice given to you by your health care provider. Make sure you discuss any questions you have with your health care provider. Document Released: 04/09/2003 Document Revised: 09/16/2015 Document Reviewed: 08/11/2015 Elsevier Interactive Patient Education  2017 [employer].

## 2016-04-02 NOTE — Discharge Summary (Signed)
Physician Discharge Summary  [name] [id number] DOB: 18-Sep-1980 DOA: 03/27/2016  PCP: [name], MD  Admit date: 03/27/2016  Discharge date: 04/02/2016  Discharge Diagnoses:  Principal Problem:   Hb-SS disease with crisis [facility]) Active Problems:   Rib pain   Chronic anticoagulation   Essential hypertension   PAH (pulmonary artery hypertension)   Sickle cell anemia (HCC)   Cor pulmonale, chronic (HCC)   Acute on chronic respiratory failure with hypoxia (HCC)   Chronic anemia   Slurred speech  Discharge Condition: Stable  Disposition:  Follow-up Information    Brynden Thune, MD Follow up in 1 week(s).   Specialty:  Internal Medicine Contact information: [place] [id number] [phone]          Diet: Regular  Wt Readings from Last 3 Encounters:  04/02/16 85 kg (187 lb 6.3 oz)  03/27/16 79.4 kg (175 lb)  03/24/16 79.4 kg (175 lb)   History of present illness:  [name] is a 36 y.o. male with medical history significant of sickle cell disease, chronic respiratory failure with hypoxia, pulmonary artery hypertension, essential hypertension, stroke avascular necrosis. Patient reports sharp lower back and bilateral lower rib pain that started 4 days ago. He has been taking his oral narcotics at home without any benefit to his pain. He reports no chest pain, cough, sneezing, rhinorrhea, fevers. Pain is localized to these areas without radiation and is constant.  ED Course: Vitals: Afebrile. Normal pulse. Respirations and high teens to low 20s. Blood pressure is normotensive. SPO2 of 89% via nasal cannula Labs: CO2 of 20, white blood cell count of 16 K, hemoglobin of 7.4, hematocrit of 22, reticulocyte count greater than 23% Imaging: Chest x-ray significant for no evidence of acute cardiopulmonary disease with evidence of cardiomegaly and pulmonary vascular congestion. Medications/Course: Patient given 6 mg of Dilaudid total  and 25 mg of Benadryl IV  Hospital Course:  Patient was admitted for Sickle Cell Pain crisis and Acute on Chronic Respiratory Failure with Hypoxia. Patient was managed with standard sickle cell pain crisis management protocol with IVF, Toradol and Dilaudid via PCA and clinician assisted doses. Patient developed acute pulmonary edema with worsened hypoxia which improved with discontinued IVF and trial of lasix. Initially patient continues to have progressive worsening of respiratory function likely related to non-adherent to his supplemental Oxygen use at home and even in the hospital. He continues to have a pattern of increase in Oxygen requirements after being without Oxygen for an extended period of time. Unfortunately his pulmonary HTN is worsening as a result of prolonged Hypoxia and patient is still non-adherent to wearing Oxygen as prescribed. He was continued on his chronic anticoagulation for chronic PE with Xarelto. He slowly improved with his oxygenation improving with diuresis and weights decreasing. BNP also improved. Patient was hemodynamically stable. He was discharged home in a stable condition with Pulse Oxy above 87% target. Patient extensively counseled again about adherence with oxygen use at home at all times, he was educated about consequences of non-adherence including death from severe hypoxemia. He verbalized understanding. He will follow up with me in the clinic within one week of this discharge.  We discussed the need for good hydration, monitoring of hydration status, avoidance of heat, cold, stress, and infection triggers. We discussed the need to be compliant with taking Hydrea. [name] was reminded of the need to seek medical attention for any symptoms of bleeding, anemia, or infection occurs.  Discharge Exam: Vitals:  04/02/16 0521 04/02/16 0750  BP: 123/75   Pulse: 92   Resp: 19 15  Temp: 98.3 F (36.8 C)    Vitals:   04/02/16 0104 04/02/16 0344 04/02/16 0521 04/02/16  0750  BP: 127/67  123/75   Pulse: 74  92   Resp: 18 (!) 23 19 15   Temp: 98 F (36.7 C)  98.3 F (36.8 C)   TempSrc: Oral  Oral   SpO2: 91% 90% 90% (!) 88%  Weight:   85 kg (187 lb 6.3 oz)   Height:       General appearance : Awake, alert, not in any distress. Speech Clear. Not toxic looking HEENT: Atraumatic and Normocephalic, pupils equally reactive to light and accomodation Neck: Supple, no JVD. No cervical lymphadenopathy.  Chest: Good air entry bilaterally, no added sounds  CVS: S1 S2 regular, + murmurs.  Abdomen: Bowel sounds present, Non tender and not distended with no gaurding, rigidity or rebound. Extremities: B/L Lower Ext shows no edema, both legs are warm to touch Neurology: Awake alert, and oriented X 3, CN II-XII intact, Non focal Skin: No Rash  Discharge Instructions  Discharge Instructions    Diet - low sodium heart healthy    Complete by:  As directed    Increase activity slowly    Complete by:  As directed      Allergies as of 04/02/2016   No Known Allergies     Medication List    TAKE these medications   ambrisentan 5 MG tablet Commonly known as:  LETAIRIS Take 5 mg by mouth daily.   aspirin 81 MG chewable tablet Chew 1 tablet (81 mg total) by mouth daily.   carvedilol 3.125 MG tablet Commonly known as:  COREG Take 1 tablet (3.125 mg total) by mouth 2 (two) times daily with a meal.   cholecalciferol 1000 units tablet Commonly known as:  VITAMIN D Take 2,000 Units by mouth daily.   folic acid 1 MG tablet Commonly known as:  FOLVITE Take 1 mg by mouth daily.   gabapentin 300 MG capsule Commonly known as:  NEURONTIN Take 1 capsule (300 mg total) by mouth 3 (three) times daily.   hydroxyurea 500 MG capsule Commonly known as:  HYDREA Take 2 capsules (1,000 mg total) by mouth daily. May take with food to minimize GI side effects.   JADENU 360 MG Tabs Generic drug:  Deferasirox Take 1,080 mg by mouth daily.   morphine 30 MG 12 hr  tablet Commonly known as:  MS CONTIN Take 1 tablet (30 mg total) by mouth every 12 (twelve) hours.   rivaroxaban 20 MG Tabs tablet Commonly known as:  XARELTO Take 1 tablet (20 mg total) by mouth at bedtime.   zolpidem 10 MG tablet Commonly known as:  AMBIEN Take 1 tablet (10 mg total) by mouth at bedtime as needed for sleep.      The results of significant diagnostics from this hospitalization (including imaging, microbiology, ancillary and laboratory) are listed below for reference.    Significant Diagnostic Studies: Dg Chest 2 View  Result Date: 03/27/2016 CLINICAL DATA:  Left chest pain.  Sickle cell anemia EXAM: CHEST  2 VIEW COMPARISON:  03/22/2016 FINDINGS: Cardiac enlargement with vascular congestion. Negative for edema or effusion. Negative for pneumonia. Port-A-Cath tip in the SVC. IMPRESSION: Cardiac enlargement with pulmonary vascular congestion. Negative for pneumonia or edema. Electronically Signed   By: Franchot Gallo M.D.   On: 03/27/2016 16:02   Dg Chest 2 View  Result Date: 03/22/2016 CLINICAL DATA:  Chest/bilateral rib pain since last night. Sickle cell crisis. EXAM: CHEST  2 VIEW COMPARISON:  Chest CT 03/13/2016 and radiographs 03/11/2016 FINDINGS: Left subclavian Port-A-Cath remains in place, with tip overlying the lower SVC. Cardiac silhouette remains moderately enlarged, unchanged. Mild diffuse chronic interstitial accentuation is unchanged. Focal scarring is noted in the right mid lung. There is no evidence of acute airspace consolidation, pulmonary edema, pleural effusion, or pneumothorax. No acute osseous abnormality is seen. IMPRESSION: No active cardiopulmonary disease. Electronically Signed   By: Logan Bores M.D.   On: 03/22/2016 08:18   Dg Chest 2 View  Result Date: 03/11/2016 CLINICAL DATA:  Sickle cell pain crisis with bilateral chest pain beginning this morning. EXAM: CHEST  2 VIEW COMPARISON:  03/06/2016 FINDINGS: Moderate cardiomegaly remains stable.  Left-sided power port remains in appropriate position. Mild scarring and peripheral right midlung is unchanged. No evidence of acute pulmonary infiltrate or edema. No evidence of pleural effusion. IMPRESSION: Stable cardiomegaly.  No active lung disease. Electronically Signed   By: Earle Gell M.D.   On: 03/11/2016 08:55   Dg Chest 2 View  Result Date: 03/06/2016 CLINICAL DATA:  36 year old male with right rib pain and sickle cell crisis. Initial encounter. EXAM: CHEST  2 VIEW COMPARISON:  02/29/2016 and earlier. FINDINGS: Stable cardiomegaly and mediastinal contours. Stable left chest power port, currently accessed. Interval resolved patchy and nodular right upper lung opacity. No pneumothorax, pulmonary edema, pleural effusion or acute pulmonary opacity. Stable basilar predominant mild chronic increased interstitial markings. Negative visible bowel gas pattern. Stable cholecystectomy clips. No acute osseous abnormality identified. IMPRESSION: Interval resolved right upper lobe opacity. Stable cardiomegaly with no acute cardiopulmonary abnormality. Electronically Signed   By: Genevie Ann M.D.   On: 03/06/2016 10:46   Ct Angio Chest Pe W And/or Wo Contrast  Result Date: 03/13/2016 CLINICAL DATA:  History of sickle cell. Left-sided chest pain starting this morning about 3 a.m. EXAM: CT ANGIOGRAPHY CHEST WITH CONTRAST TECHNIQUE: Multidetector CT imaging of the chest was performed using the standard protocol during bolus administration of intravenous contrast. Multiplanar CT image reconstructions and MIPs were obtained to evaluate the vascular anatomy. CONTRAST:  100 mL Isovue 370 COMPARISON:  CT chest 03/06/2016, 02/21/2016, 01/05/2016 FINDINGS: Cardiovascular: Satisfactory opacification of the pulmonary arteries to the segmental level. No evidence of pulmonary embolism. Stable cardiomegaly. No pericardial effusion. Normal caliber thoracic aorta. No thoracic aortic atherosclerosis or dissection. Left-sided  Port-A-Cath again noted. Mediastinum/Nodes: Thyroid gland, trachea, and esophagus demonstrate no significant findings. Multiple small prevascular lymph nodes unchanged compared with the prior exams. Mildly enlarged stable subcarinal lymph node. No axillary lymphadenopathy. Lungs/Pleura: Patchy areas of ground-glass opacities in bilateral upper lobes and lower lobes which may reflect mild pulmonary edema versus alveolitis secondary to an infectious or inflammatory etiology. No pleural effusion or pneumothorax. Upper Abdomen: No acute abnormality in the upper abdomen. Chronic calcified, involuted spleen. Musculoskeletal: No acute osseous abnormality. No lytic or sclerotic osseous lesion. Soft tissue: Bilateral retroareolar fibroglandular soft tissue as can be seen with gynecomastia. Review of the MIP images confirms the above findings. IMPRESSION: 1. No evidence of acute pulmonary embolus. 2. Stable cardiomegaly. 3. Patchy areas of ground-glass opacities in bilateral upper lobes and lower lobes which may reflect mild pulmonary edema versus alveolitis secondary to an infectious or inflammatory etiology. Electronically Signed   By: Kathreen Devoid   On: 03/13/2016 10:13   Ct Angio Chest Pe W And/or Wo Contrast  Result Date: 03/06/2016 CLINICAL  DATA:  Chest pain, new hypoxia.  Sickle cell disease. EXAM: CT ANGIOGRAPHY CHEST WITH CONTRAST TECHNIQUE: Multidetector CT imaging of the chest was performed using the standard protocol during bolus administration of intravenous contrast. Multiplanar CT image reconstructions and MIPs were obtained to evaluate the vascular anatomy. CONTRAST:  100 cc Isovue 370 Additional 80 cc Isovue 370 given due to port leakage and patient motion. COMPARISON:  Chest CT angiogram dated 02/21/2016. FINDINGS: Cardiovascular: Despite repeat imaging, the peripheral segmental and subsegmental pulmonary arteries to the lower lobes are difficult to definitively characterize due to patient motion artifact  and suboptimal contrast opacification. There is, however, no pulmonary embolism identified within the main, lobar or central segmental pulmonary arteries bilaterally. Patient has had chronic lower lobe subsegmental pulmonary emboli described on previous exams, not able to be delineated again on today's study due to the patient motion artifact and suboptimal contrast opacification. Thoracic aorta is normal in caliber and configuration. Heart is prominently enlarged, stable compared to the previous exam. No pericardial effusion seen. Mediastinum/Nodes: Numerous small and moderate-sized lymph nodes within the anterior mediastinum are stable. Normal residual thymic tissue is also noted within the anterior mediastinum. Esophagus appears normal. Trachea and central bronchi are unremarkable. Lungs/Pleura: Mild bibasilar scarring/atelectasis is stable. Lungs otherwise clear. No evidence of pneumonia. No pleural effusion or pneumothorax. Upper Abdomen: Limited images of the upper abdomen are unremarkable. Musculoskeletal: No acute or suspicious osseous lesion. Superficial soft tissues are unremarkable. Review of the MIP images confirms the above findings. IMPRESSION: 1. No acute pulmonary embolus identified, with mild study limitations detailed above. 2. Stable cardiomegaly. 3. No pneumonia or pulmonary edema. Stable mild scarring/fibrosis at the lung bases. 4. Stable mediastinal lymphadenopathy. Electronically Signed   By: Franki Cabot M.D.   On: 03/06/2016 13:30    Microbiology: No results found for this or any previous visit (from the past 240 hour(s)).   Labs: Basic Metabolic Panel:  Recent Labs Lab 03/27/16 1419 03/29/16 1405 03/31/16 0600 04/01/16 0500 04/02/16 0500  NA 136 137 140 141 141  K 3.7 4.6 4.2 4.2 4.0  CL 107 109 110 110 110  CO2 20* 24 25 26 27   GLUCOSE 117* 102* 112* 118* 136*  BUN 10 12 17  21* 17  CREATININE 0.85 0.85 0.88 1.00 0.96  CALCIUM 8.6* 8.5* 8.8* 8.6* 8.8*   Liver  Function Tests:  Recent Labs Lab 03/27/16 0316 03/27/16 1419  AST 37 40  ALT 30 33  ALKPHOS 92 91  BILITOT 5.8* 5.8*  PROT 7.3 7.3  ALBUMIN 3.9 4.0   No results for input(s): LIPASE, AMYLASE in the last 168 hours. No results for input(s): AMMONIA in the last 168 hours. CBC:  Recent Labs Lab 03/27/16 0316 03/27/16 1419 03/29/16 1405 03/31/16 0600 04/02/16 0500  WBC 15.5* 16.0* 14.2* 13.8* 13.4*  NEUTROABS 10.5*  --  11.0* 9.0* 8.5*  HGB 7.4* 7.4* 7.5* 6.7* 6.7*  HCT 21.4* 22.0* 21.7* 19.0* 18.9*  MCV 88.4 87.0 89.3 86.8 85.5  PLT 457* 454* 412* 349 390   Cardiac Enzymes: No results for input(s): CKTOTAL, CKMB, CKMBINDEX, TROPONINI in the last 168 hours. BNP: Invalid input(s): POCBNP CBG: No results for input(s): GLUCAP in the last 168 hours.  Time coordinating discharge: 50 minutes  Signed:  Taija Mathias, [facility] 04/02/2016, 10:14 AM

## 2016-04-02 NOTE — Progress Notes (Signed)
Nursing Discharge Summary  Patient ID: RYANANTHONY DURBANO MRN: [id number] DOB/AGE: 05/31/1980 36 y.o.  Admit date: 03/27/2016 Discharge date: 04/02/2016  Discharged Condition: good  Disposition: 01-Home or [place]    JEGEDE, OLUGBEMIGA, MD Follow up in 1 week(s).   Specialty:  Internal Medicine Contact information: [place] [address] [phone]           Prescriptions Given: No new medications.  Patient follow up appointments and medications discussed with patient and he verbalized understanding without further questions.  Means of Discharge: Patient to ambulate downstairs to be discharged home via private vehicle. Patient didn't wear home oxygen when being discharged stated he would get it when he got home.   Signed: Buel Ream 04/02/2016, 1:59 PM

## 2016-04-04 ENCOUNTER — Telehealth (HOSPITAL_COMMUNITY): Payer: Self-pay | Admitting: *Deleted

## 2016-04-04 DIAGNOSIS — D5700 Hb-SS disease with crisis, unspecified: Secondary | ICD-10-CM | POA: Diagnosis not present

## 2016-04-04 NOTE — Telephone Encounter (Signed)
Pt called with complain of 9/10 pain in back and ribs; denies CP, N/V/D, fever. Gerald Hollis, NP notified and aware; pt is advised at this time to stay warm and hydrated and continue with home medication. Pt also advised to call beck if not feeling better. [name] [facility]

## 2016-04-05 ENCOUNTER — Encounter (HOSPITAL_COMMUNITY): Payer: Self-pay | Admitting: *Deleted

## 2016-04-05 ENCOUNTER — Telehealth: Payer: Self-pay

## 2016-04-05 ENCOUNTER — Other Ambulatory Visit: Payer: Self-pay | Admitting: Internal Medicine

## 2016-04-05 ENCOUNTER — Telehealth (HOSPITAL_COMMUNITY): Payer: Self-pay | Admitting: *Deleted

## 2016-04-05 ENCOUNTER — Non-Acute Institutional Stay (HOSPITAL_COMMUNITY)
Admission: AD | Admit: 2016-04-05 | Discharge: 2016-04-05 | Disposition: A | Discharge status: Home / Self Care | Payer: Medicare Other | Source: Ambulatory Visit | Attending: Internal Medicine | Admitting: Internal Medicine

## 2016-04-05 DIAGNOSIS — Z96641 Presence of right artificial hip joint: Secondary | ICD-10-CM | POA: Insufficient documentation

## 2016-04-05 DIAGNOSIS — Z87891 Personal history of nicotine dependence: Secondary | ICD-10-CM | POA: Insufficient documentation

## 2016-04-05 DIAGNOSIS — Z7901 Long term (current) use of anticoagulants: Secondary | ICD-10-CM | POA: Insufficient documentation

## 2016-04-05 DIAGNOSIS — Z7982 Long term (current) use of aspirin: Secondary | ICD-10-CM | POA: Insufficient documentation

## 2016-04-05 DIAGNOSIS — Z9114 Patient's other noncompliance with medication regimen: Secondary | ICD-10-CM | POA: Diagnosis not present

## 2016-04-05 DIAGNOSIS — Z86711 Personal history of pulmonary embolism: Secondary | ICD-10-CM | POA: Insufficient documentation

## 2016-04-05 DIAGNOSIS — J9611 Chronic respiratory failure with hypoxia: Secondary | ICD-10-CM | POA: Diagnosis not present

## 2016-04-05 DIAGNOSIS — I10 Essential (primary) hypertension: Secondary | ICD-10-CM | POA: Diagnosis not present

## 2016-04-05 DIAGNOSIS — D5700 Hb-SS disease with crisis, unspecified: Secondary | ICD-10-CM | POA: Diagnosis not present

## 2016-04-05 DIAGNOSIS — Z8673 Personal history of transient ischemic attack (TIA), and cerebral infarction without residual deficits: Secondary | ICD-10-CM | POA: Insufficient documentation

## 2016-04-05 DIAGNOSIS — I2721 Secondary pulmonary arterial hypertension: Secondary | ICD-10-CM | POA: Insufficient documentation

## 2016-04-05 DIAGNOSIS — Z79899 Other long term (current) drug therapy: Secondary | ICD-10-CM | POA: Insufficient documentation

## 2016-04-05 DIAGNOSIS — R52 Pain, unspecified: Secondary | ICD-10-CM | POA: Diagnosis present

## 2016-04-05 DIAGNOSIS — F5101 Primary insomnia: Secondary | ICD-10-CM

## 2016-04-05 LAB — COMPREHENSIVE METABOLIC PANEL
ALBUMIN: 4.4 g/dL (ref 3.5–5.0)
ALK PHOS: 115 U/L (ref 38–126)
ALT: 31 U/L (ref 17–63)
ANION GAP: 8 (ref 5–15)
AST: 52 U/L — ABNORMAL HIGH (ref 15–41)
BUN: 11 mg/dL (ref 6–20)
CALCIUM: 9.5 mg/dL (ref 8.9–10.3)
CHLORIDE: 104 mmol/L (ref 101–111)
CO2: 23 mmol/L (ref 22–32)
Creatinine, Ser: 0.88 mg/dL (ref 0.61–1.24)
GFR calc non Af Amer: >60 mL/min (ref >60)
GLUCOSE: 110 mg/dL — AB (ref 65–99)
Potassium: 3.6 mmol/L (ref 3.5–5.1)
SODIUM: 135 mmol/L (ref 135–145)
Total Bilirubin: 6.2 mg/dL — ABNORMAL HIGH (ref 0.3–1.2)
Total Protein: 8.1 g/dL (ref 6.5–8.1)

## 2016-04-05 LAB — CBC WITH DIFFERENTIAL/PLATELET
BASOS PCT: 1 %
Basophils Absolute: 0.1 10*3/uL (ref 0.0–0.1)
EOS PCT: 2 %
Eosinophils Absolute: 0.3 10*3/uL (ref 0.0–0.7)
HEMATOCRIT: 22.3 % — AB (ref 39.0–52.0)
HEMOGLOBIN: 7.8 g/dL — AB (ref 13.0–17.0)
LYMPHS PCT: 12 %
Lymphs Abs: 1.7 10*3/uL (ref 0.7–4.0)
MCH: 29.9 pg (ref 26.0–34.0)
MCHC: 35 g/dL (ref 30.0–36.0)
MCV: 85.4 fL (ref 78.0–100.0)
MONOS PCT: 14 %
Monocytes Absolute: 1.9 10*3/uL — ABNORMAL HIGH (ref 0.1–1.0)
NEUTROS ABS: 9.9 10*3/uL — AB (ref 1.7–7.7)
NEUTROS PCT: 71 %
PLATELETS: 533 10*3/uL — AB (ref 150–400)
RBC: 2.61 MIL/uL — ABNORMAL LOW (ref 4.22–5.81)
RDW: 23.4 % — ABNORMAL HIGH (ref 11.5–15.5)
WBC: 13.9 10*3/uL — ABNORMAL HIGH (ref 4.0–10.5)

## 2016-04-05 LAB — RETICULOCYTES
RBC.: 2.61 MIL/uL — ABNORMAL LOW (ref 4.22–5.81)
Retic Count, Absolute: 428 10*3/uL — ABNORMAL HIGH (ref 19.0–186.0)
Retic Ct Pct: 16.4 % — ABNORMAL HIGH (ref 0.4–3.1)

## 2016-04-05 IMAGING — CR DG CHEST 2V
2 series · 2 of 2 positions shown · non-contrast
Comparison: Chest x-ray 04/09/2013.

CLINICAL DATA: Weakness, fatigue and body aches. Sickle cell
disease.

EXAM:
CHEST  2 VIEW

[w chest pa]
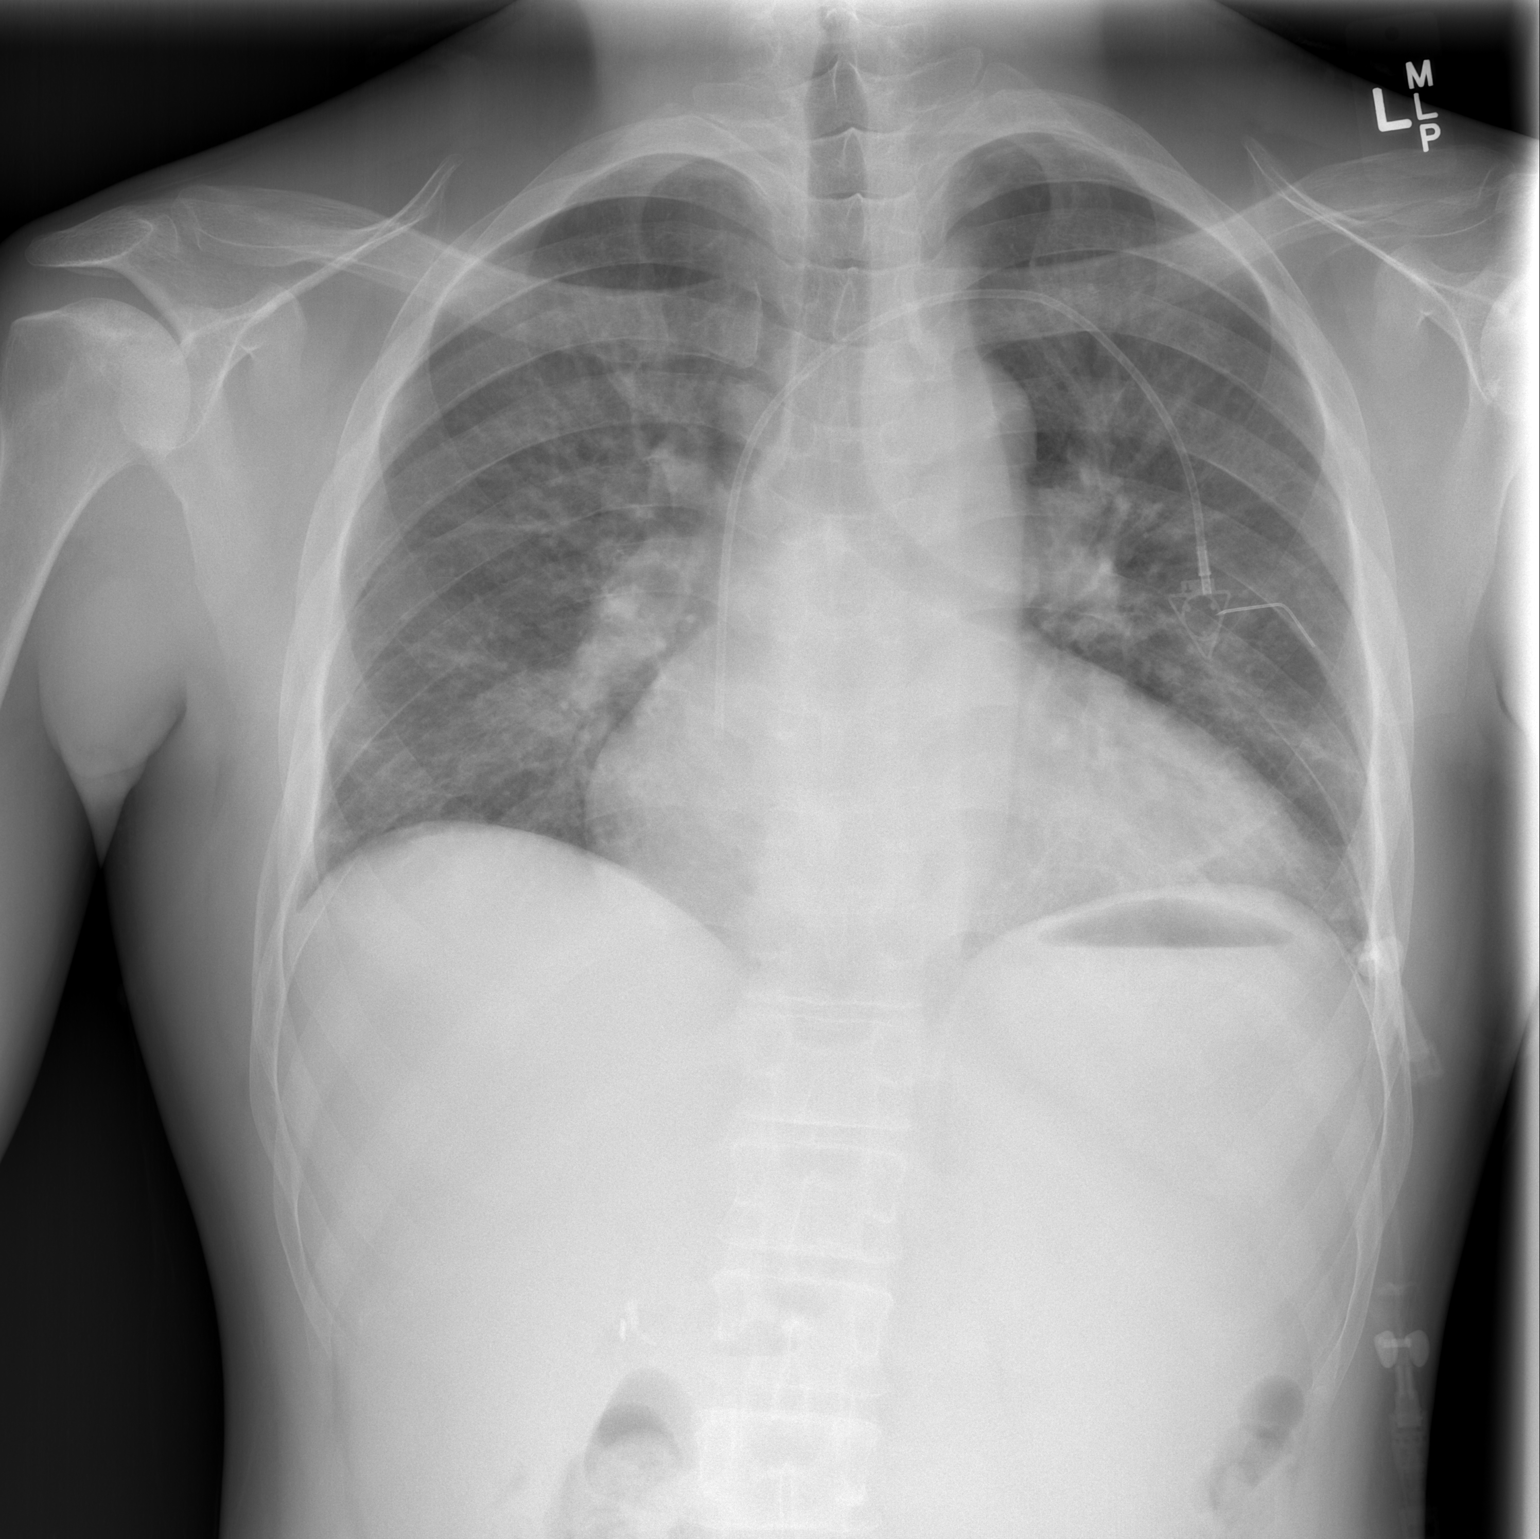

[w chest lat]
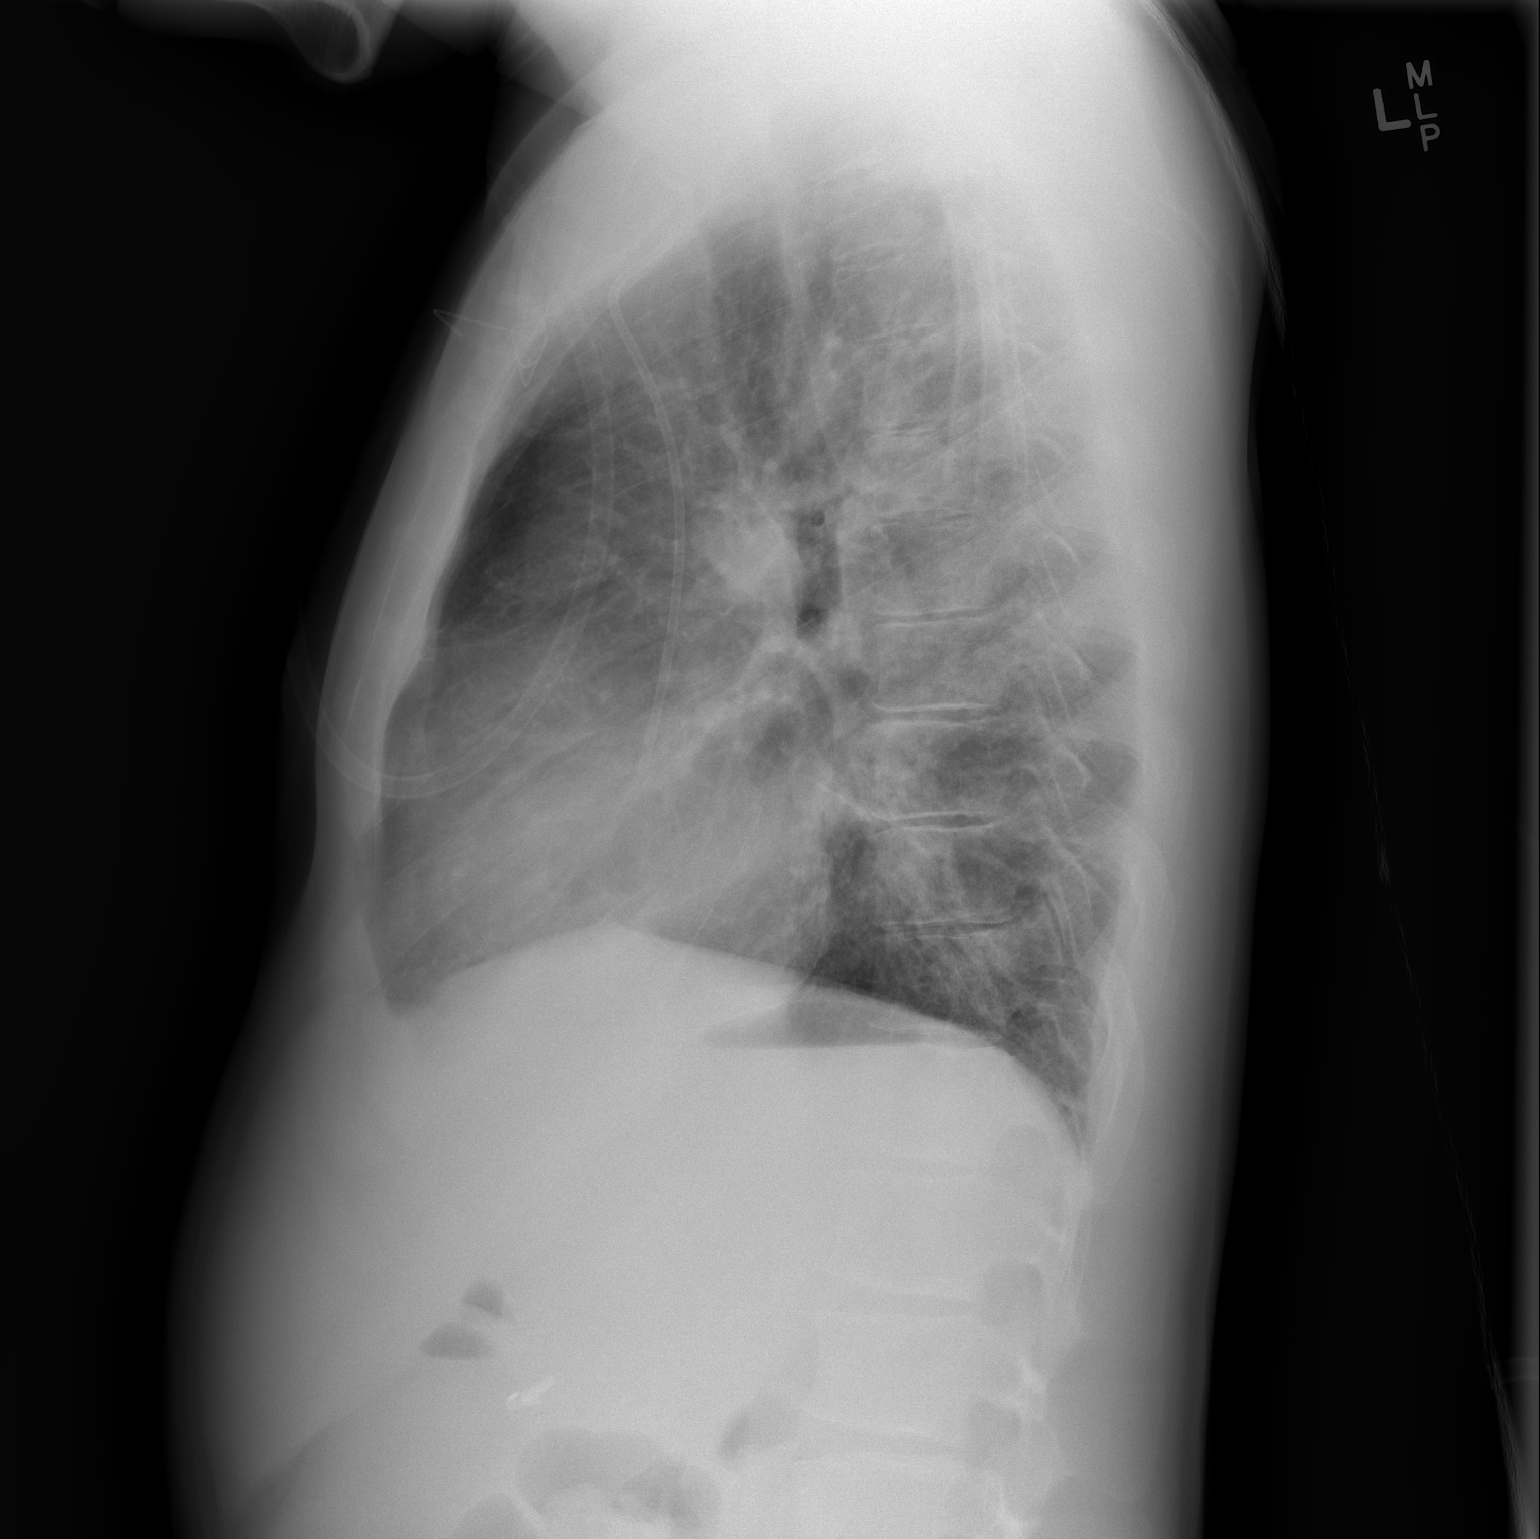

[2 of 2 positions shown; findings below may reference images not displayed]

FINDINGS: Mild diffuse interstitial prominence and patchy airspace opacities,
most notably in the right lower lobe, concerning for acute chest
syndrome in this patient history of sickle cell disease. No pleural
effusion. No definite evidence of pulmonary edema. Heart size
remains mildly enlarged. Upper mediastinal contours are within
normal limits. Left subclavian single-lumen power porta cath with
tip terminating at the superior cavoatrial junction. Surgical clips
project over the right upper quadrant of the abdomen, compatible
with prior cholecystectomy.
IMPRESSION: 1. Interval development of interstitial and patchy airspace
opacities concerning for acute chest syndrome in this patient with
history of sickle cell disease. Clinical correlation is recommended.
2. Mild cardiomegaly (unchanged).

## 2016-04-05 MED ORDER — ZOLPIDEM TARTRATE 10 MG PO TABS: 10.0000 mg | ORAL_TABLET | Freq: Every evening | ORAL | 0 refills | Status: DC | PRN

## 2016-04-05 MED ORDER — SODIUM CHLORIDE 0.9 % IV SOLN
25.0000 mg | INTRAVENOUS | Status: DC | PRN
  Filled 2016-04-05: qty 0.5

## 2016-04-05 MED ORDER — SODIUM CHLORIDE 0.9% FLUSH
10.0000 mL | INTRAVENOUS | Status: AC | PRN
  Administered 2016-04-05: 10 mL

## 2016-04-05 MED ORDER — ONDANSETRON HCL 4 MG/2ML IJ SOLN: 4.0000 mg | Freq: Four times a day (QID) | INTRAMUSCULAR | Status: DC | PRN

## 2016-04-05 MED ORDER — HEPARIN SOD (PORK) LOCK FLUSH 100 UNIT/ML IV SOLN
500.0000 [IU] | INTRAVENOUS | Status: AC | PRN
  Administered 2016-04-05: 500 [IU]
  Filled 2016-04-05: qty 5

## 2016-04-05 MED ORDER — MORPHINE SULFATE ER 30 MG PO TBCR: 30.0000 mg | EXTENDED_RELEASE_TABLET | Freq: Two times a day (BID) | ORAL | 0 refills | Status: DC

## 2016-04-05 MED ORDER — SODIUM CHLORIDE 0.45 % IV SOLN
INTRAVENOUS | Status: DC
  Administered 2016-04-05: 10:00:00 via INTRAVENOUS

## 2016-04-05 MED ORDER — SENNOSIDES-DOCUSATE SODIUM 8.6-50 MG PO TABS: 1.0000 | ORAL_TABLET | Freq: Two times a day (BID) | ORAL | Status: DC

## 2016-04-05 MED ORDER — DIPHENHYDRAMINE HCL 25 MG PO CAPS: 25.0000 mg | ORAL_CAPSULE | ORAL | Status: DC | PRN

## 2016-04-05 MED ORDER — POLYETHYLENE GLYCOL 3350 17 G PO PACK: 17.0000 g | PACK | Freq: Every day | ORAL | Status: DC | PRN

## 2016-04-05 MED ORDER — HYDROMORPHONE 1 MG/ML IV SOLN
INTRAVENOUS | Status: DC
  Administered 2016-04-05: 10:00:00 via INTRAVENOUS
  Administered 2016-04-05: 18.6 mg via INTRAVENOUS
  Filled 2016-04-05: qty 25

## 2016-04-05 MED ORDER — NALOXONE HCL 0.4 MG/ML IJ SOLN
0.4000 mg | INTRAMUSCULAR | Status: DC | PRN
Start: 2016-04-05 — End: 2016-04-05

## 2016-04-05 MED ORDER — SODIUM CHLORIDE 0.9% FLUSH: 9.0000 mL | INTRAVENOUS | Status: DC | PRN

## 2016-04-05 MED ORDER — HYDROMORPHONE HCL 2 MG/ML IJ SOLN: 1.0000 mg | INTRAMUSCULAR | Status: DC | PRN

## 2016-04-05 MED ORDER — KETOROLAC TROMETHAMINE 30 MG/ML IJ SOLN
30.0000 mg | Freq: Four times a day (QID) | INTRAMUSCULAR | Status: DC
  Administered 2016-04-05: 30 mg via INTRAVENOUS
  Filled 2016-04-05: qty 1

## 2016-04-05 MED ORDER — HYDROMORPHONE HCL 4 MG PO TABS: 4.0000 mg | ORAL_TABLET | ORAL | 0 refills | Status: DC | PRN

## 2016-04-05 NOTE — Telephone Encounter (Signed)
Patient called requesting to be seen at the [facility] c/o side, back and bilateral leg pain. Patient rates pain  8/10 on pain scale. Patient denies fever, chest pain, N/V/D, abdominal pain, or priapism. Patient reports taking his home medication this morning with no relief. Patient also reports he has been staying hydrated.    Patient placed on a brief hold and provider notified. Patient advised to come to the day hospital for treatment per Dr. Doreene Burke. Patient states an understanding and is on the way.

## 2016-04-05 NOTE — Progress Notes (Signed)
Pt received to the [facility] for treatment. Pt states his pain is 8/10 and it is in his lower back and rib cage. He was treated with IV fluids, Dilaudid PCA and Toradol. Pt was also placed on oxygen to keep his sats above 89. He is on 3 liters at home and was place on that. His pain at discharge was 5/10. He was alert, oriented and ambulatory. He recieved his discharge instructions with verbal understanding, along with prescriptions which included oxygen.. Pt was discharge home with his brother.

## 2016-04-05 NOTE — H&P (Signed)
[facility] History and Physical  DONNELL BURDON [id number] DOB: [birth year] DOA: 04/05/2016  PCP: [name], MD   Chief Complaint: Sickle Cell Pain  HPI: [name] is a 36 y.o. male with history of sickle cell disease, chronic respiratory failure with hypoxia, pulmonary artery hypertension, essential hypertension, stroke and avascular necrosis. He presented to the day hospital today with complaints of lower back, lower limbs and bilateral lower rib pain that started this morning not responding to his home medications. He has been taking his oral narcotics at home without sustained relief. He reports no chest pain, cough, sneezing, rhinorrhea, fevers. Pain is localized to these areas without radiation and is constant. He rates his pain at 8/10 on presentation. He claims his prescription for mobile oxygen has expired at [facility], needs a new prescription. He denies SOB.   Systemic Review: General: The patient denies anorexia, fever, weight loss Cardiac: Denies chest pain, syncope, palpitations, pedal edema  Respiratory: Denies cough, shortness of breath, wheezing GI: Denies severe indigestion/heartburn, abdominal pain, nausea, vomiting, diarrhea and constipation GU: Denies hematuria, incontinence, dysuria  Skin: Denies suspicious skin lesions Neurologic: Denies focal weakness or numbness, change in vision  Past Medical History:  Diagnosis Date  . Acute chest syndrome ([place]) 06/18/2013  . Acute embolism and thrombosis of right internal jugular vein ([place])   . Alcohol consumption of one to four drinks per day   . Avascular necrosis (HCC)    Right Hip  . Blood transfusion   . Chronic anticoagulation   . Demand ischemia ([place]) 01/02/2014  . Former smoker   . Functional asplenia   . Hb-SS disease with crisis ([place])   . History of Clostridium difficile infection   . History of pulmonary embolus (PE)   . Hypertension   . Hypokalemia   . Leukocytosis     Chronic  . Mood disorder ([place])   . Noncompliance with medication regimen   . Oxygen deficiency   . Pulmonary hypertension   . Second hand tobacco smoke exposure   . Sickle cell anemia (HCC)   . Sickle-cell crisis with associated acute chest syndrome ([place]) 05/13/2013  . Stroke ([place])   . Thrombocytosis (HCC)    Chronic  . Uses marijuana     Past Surgical History:  Procedure Laterality Date  . CHOLECYSTECTOMY     01/2008  . Excision of left periauricular cyst     10/2009  . Excision of right ear lobe cyst with primary closur     11/2007  . Porta cath placement    . Porta cath removal    . PORTACATH PLACEMENT  01/05/2012   Procedure: INSERTION PORT-A-CATH;  Surgeon: Odis Hollingshead, MD;  Location: [place];  Service: General;  Laterality: N/A;  ultrasound guiced port a cath insertion with fluoroscopy  . Right hip replacement     08/2006  . UMBILICAL HERNIA REPAIR     01/2008   Family History  Problem Relation Age of Onset  . Sickle cell trait Mother   . Depression Mother   . Diabetes Mother   . Sickle cell trait Father   . Sickle cell trait Brother     Prior to Admission medications   Medication Sig Start Date End Date Taking? Authorizing Provider  ambrisentan (LETAIRIS) 5 MG tablet Take 5 mg by mouth daily.     Historical Provider, MD  aspirin 81 MG chewable tablet Chew 1 tablet (81 mg total) by mouth daily. 07/23/14  [name], MD  carvedilol (COREG) 3.125 MG tablet Take 1 tablet (3.125 mg total) by mouth 2 (two) times daily with a meal. 03/17/16   [name], MD  cholecalciferol (VITAMIN D) 1000 units tablet Take 2,000 Units by mouth daily.    Historical Provider, MD  Deferasirox (JADENU) 360 MG TABS Take 1,080 mg by mouth daily.     Historical Provider, MD  folic acid (FOLVITE) 1 MG tablet Take 1 mg by mouth daily.    Historical Provider, MD  gabapentin (NEURONTIN) 300 MG capsule Take 1 capsule (300 mg total) by mouth 3 (three) times daily. 06/01/15    Tresa Garter, MD  hydroxyurea (HYDREA) 500 MG capsule Take 2 capsules (1,000 mg total) by mouth daily. May take with food to minimize GI side effects. 03/17/16   [name], MD  morphine (MS CONTIN) 30 MG 12 hr tablet Take 1 tablet (30 mg total) by mouth every 12 (twelve) hours. 03/08/16 04/07/16  Tresa Garter, MD  rivaroxaban (XARELTO) 20 MG TABS tablet Take 1 tablet (20 mg total) by mouth at bedtime. 12/29/15   Tresa Garter, MD  zolpidem (AMBIEN) 10 MG tablet Take 1 tablet (10 mg total) by mouth at bedtime as needed for sleep. 03/08/16 04/07/16  Tresa Garter, MD   Physical Exam: There were no vitals filed for this visit.  General: Alert, awake, afebrile, anicteric, not in obvious distress HEENT: Normocephalic and Atraumatic, Mucous membranes pink                PERRLA; EOM intact; No scleral icterus,                 Nares: Patent, Oropharynx: Clear, Fair Dentition                 Neck: FROM, no cervical lymphadenopathy, thyromegaly, carotid bruit or JVD;  CHEST: Normal respiration, clear to auscultation bilaterally  HEART: Regular rate and rhythm; no murmurs rubs or gallops  BACK: No kyphosis or scoliosis; no CVA tenderness  ABDOMEN: Positive Bowel Sounds, soft, non-tender; no masses, no organomegaly EXTREMITIES: No cyanosis, clubbing, or edema SKIN:  no rash or ulceration  CNS: Alert and Oriented x 4, Nonfocal exam, CN 2-12 intact  Labs on Admission:  Basic Metabolic Panel:  Recent Labs Lab 03/29/16 1405 03/31/16 0600 04/01/16 0500 04/02/16 0500  NA 137 140 141 141  K 4.6 4.2 4.2 4.0  CL 109 110 110 110  CO2 24 25 26 27   GLUCOSE 102* 112* 118* 136*  BUN 12 17 21* 17  CREATININE 0.85 0.88 1.00 0.96  CALCIUM 8.5* 8.8* 8.6* 8.8*   Liver Function Tests: No results for input(s): AST, ALT, ALKPHOS, BILITOT, PROT, ALBUMIN in the last 168 hours. No results for input(s): LIPASE, AMYLASE in the last 168 hours. No results for input(s): AMMONIA in the  last 168 hours. CBC:  Recent Labs Lab 03/29/16 1405 03/31/16 0600 04/02/16 0500  WBC 14.2* 13.8* 13.4*  NEUTROABS 11.0* 9.0* 8.5*  HGB 7.5* 6.7* 6.7*  HCT 21.7* 19.0* 18.9*  MCV 89.3 86.8 85.5  PLT 412* 349 390   Cardiac Enzymes: No results for input(s): CKTOTAL, CKMB, CKMBINDEX, TROPONINI in the last 168 hours.  BNP (last 3 results)  Recent Labs  03/30/16 0800 03/31/16 0600 04/02/16 0455  BNP 974.3* 697.0* 559.5*    ProBNP (last 3 results) No results for input(s): PROBNP in the last 8760 hours.  CBG: No results for input(s): GLUCAP in the  last 168 hours.   Assessment/Plan Active Problems:   Sickle cell anemia with pain (HCC)   Admits to the [facility]  IVF .45% Saline @ 50 mls/hour, patient sensitive to IVF  Weight based Dilaudid PCA started within 30 minutes of admission  IV Toradol 30 mg Q 6 H  Monitor vitals very closely, Re-evaluate pain scale every hour  2 L of Oxygen by [place]  Patient will be re-evaluated for pain in the context of function and relationship to baseline as care progresses.  If no significant relieve from pain (remains above 5/10) will transfer patient to inpatient services for further evaluation and management  Code Status: Full  Family Communication: None  DVT Prophylaxis: Ambulate as tolerated   Time spent: 67 Minutes   Paone, MD, MHA, FACP, FAAP, CPE  If 7PM-7AM, please contact night-coverage [email or web address] 04/05/2016, 9:55 AM

## 2016-04-05 NOTE — Discharge Summary (Addendum)
Physician Discharge Summary  [name] [id number] DOB: September 22, 1980 DOA: 04/05/2016  PCP: [name], MD  Admit date: 04/05/2016  Discharge date: 04/05/2016  Time spent: 30 minutes  Discharge Diagnoses:  Active Problems:   Sickle cell anemia with pain [facility])   Discharge Condition: Stable  Diet recommendation: Regular  Filed Weights   04/05/16 0956  Weight: 184 lb (83.5 kg)    History of present illness:  [name] is a 36 y.o. male with history of sickle cell disease, chronic respiratory failure with hypoxia, pulmonary artery hypertension, essential hypertension, stroke and avascular necrosis. He presented to the day hospital today with complaints of lower back, lower limbs and bilateral lower rib pain that started this morning not responding to his home medications. He has been taking his oral narcotics at home without sustained relief. He reports no chest pain, cough, sneezing, rhinorrhea, fevers. Pain is localized to these areas without radiation and is constant. He rates his pain at 8/10 on presentation. He claims his prescription for mobile oxygen has expired at [facility], needs a new prescription. He denies SOB.   Hospital Course:  [name] was admitted to the day hospital with sickle cell painful crisis. Patient was treated with weight based IV Dilaudid PCA, IV Toradol, clinician assisted doses as deemed appropriate and IV fluids. male showed improvement symptomatically, pain improved from 8 to 5/10 at the time of discharge, he was saturating between 89 and 92% on 2-3 L of oxygen by [place]. Patient was discharged home in a hemodynamically stable condition. [name] will follow-up at the clinic as previously scheduled, continue with home medications as per prior to admission. Prescription for new portable oxygen tank was given and his pain medications were refilled as appropriate.   Discharge Instructions We discussed the need for good hydration,  monitoring of hydration status, avoidance of heat, cold, stress, and infection triggers. We discussed the need to be compliant with taking Hydrea and other home medications. [name] was reminded of the need to seek medical attention immediately if any symptom of bleeding, anemia, or infection occurs.  Discharge Exam: Vitals:   04/05/16 1256 04/05/16 1425  BP: 106/65 110/75  Pulse: 75 76  Resp: 17 18  Temp:      General appearance: alert, cooperative and no distress Eyes: conjunctivae/corneas clear. PERRL, EOM's intact. Fundi benign. Neck: no adenopathy, no carotid bruit, no JVD, supple, symmetrical, trachea midline and thyroid not enlarged, symmetric, no tenderness/mass/nodules Back: symmetric, no curvature. ROM normal. No CVA tenderness. Resp: clear to auscultation bilaterally Chest wall: no tenderness Cardio: regular rate and rhythm, S1, S2 normal, no murmur, click, rub or gallop GI: soft, non-tender; bowel sounds normal; no masses, no organomegaly Extremities: extremities normal, atraumatic, no cyanosis or edema Pulses: 2+ and symmetric Skin: Skin color, texture, turgor normal. No rashes or lesions Neurologic: Grossly normal  Discharge Instructions    Diet - low sodium heart healthy    Complete by:  As directed    Increase activity slowly    Complete by:  As directed      Discharge Medication List as of 04/05/2016  3:17 PM    START taking these medications   Details  HYDROmorphone (DILAUDID) 4 MG tablet Take 1 tablet (4 mg total) by mouth every 4 (four) hours as needed for moderate pain or severe pain., Starting Tue 04/05/2016, Until Wed 04/20/2016, Print    morphine (MS CONTIN) 30 MG 12 hr tablet Take 1 tablet (30 mg total) by mouth  every 12 (twelve) hours., Starting Tue 04/05/2016, Until Thu 05/05/2016, Print    zolpidem (AMBIEN) 10 MG tablet Take 1 tablet (10 mg total) by mouth at bedtime as needed for sleep., Starting Tue 04/05/2016, Until Thu 05/05/2016, Print      CONTINUE  these medications which have NOT CHANGED   Details  ambrisentan (LETAIRIS) 5 MG tablet Take 5 mg by mouth daily. , Historical Med    aspirin 81 MG chewable tablet Chew 1 tablet (81 mg total) by mouth daily., Starting 07/23/2014, Until Discontinued, Normal    carvedilol (COREG) 3.125 MG tablet Take 1 tablet (3.125 mg total) by mouth 2 (two) times daily with a meal., Starting Thu 03/17/2016, Normal    cholecalciferol (VITAMIN D) 1000 units tablet Take 2,000 Units by mouth daily., Historical Med    Deferasirox (JADENU) 360 MG TABS Take 1,080 mg by mouth daily. , Historical Med    folic acid (FOLVITE) 1 MG tablet Take 1 mg by mouth daily., Historical Med    gabapentin (NEURONTIN) 300 MG capsule Take 1 capsule (300 mg total) by mouth 3 (three) times daily., Starting 06/01/2015, Until Discontinued, Print    hydroxyurea (HYDREA) 500 MG capsule Take 2 capsules (1,000 mg total) by mouth daily. May take with food to minimize GI side effects., Starting Thu 03/17/2016, No Print    rivaroxaban (XARELTO) 20 MG TABS tablet Take 1 tablet (20 mg total) by mouth at bedtime., Starting Tue 12/29/2015, Normal       No Known Allergies   Significant Diagnostic Studies: Dg Chest 2 View  Result Date: 03/27/2016 CLINICAL DATA:  Left chest pain.  Sickle cell anemia EXAM: CHEST  2 VIEW COMPARISON:  03/22/2016 FINDINGS: Cardiac enlargement with vascular congestion. Negative for edema or effusion. Negative for pneumonia. Port-A-Cath tip in the SVC. IMPRESSION: Cardiac enlargement with pulmonary vascular congestion. Negative for pneumonia or edema. Electronically Signed   By: Franchot Gallo M.D.   On: 03/27/2016 16:02   Dg Chest 2 View  Result Date: 03/22/2016 CLINICAL DATA:  Chest/bilateral rib pain since last night. Sickle cell crisis. EXAM: CHEST  2 VIEW COMPARISON:  Chest CT 03/13/2016 and radiographs 03/11/2016 FINDINGS: Left subclavian Port-A-Cath remains in place, with tip overlying the lower SVC. Cardiac  silhouette remains moderately enlarged, unchanged. Mild diffuse chronic interstitial accentuation is unchanged. Focal scarring is noted in the right mid lung. There is no evidence of acute airspace consolidation, pulmonary edema, pleural effusion, or pneumothorax. No acute osseous abnormality is seen. IMPRESSION: No active cardiopulmonary disease. Electronically Signed   By: Logan Bores M.D.   On: 03/22/2016 08:18   Dg Chest 2 View  Result Date: 03/11/2016 CLINICAL DATA:  Sickle cell pain crisis with bilateral chest pain beginning this morning. EXAM: CHEST  2 VIEW COMPARISON:  03/06/2016 FINDINGS: Moderate cardiomegaly remains stable. Left-sided power port remains in appropriate position. Mild scarring and peripheral right midlung is unchanged. No evidence of acute pulmonary infiltrate or edema. No evidence of pleural effusion. IMPRESSION: Stable cardiomegaly.  No active lung disease. Electronically Signed   By: Earle Gell M.D.   On: 03/11/2016 08:55   Ct Angio Chest Pe W And/or Wo Contrast  Result Date: 03/13/2016 CLINICAL DATA:  History of sickle cell. Left-sided chest pain starting this morning about 3 a.m. EXAM: CT ANGIOGRAPHY CHEST WITH CONTRAST TECHNIQUE: Multidetector CT imaging of the chest was performed using the standard protocol during bolus administration of intravenous contrast. Multiplanar CT image reconstructions and MIPs were obtained to evaluate the vascular anatomy.  CONTRAST:  100 mL Isovue 370 COMPARISON:  CT chest 03/06/2016, 02/21/2016, 01/05/2016 FINDINGS: Cardiovascular: Satisfactory opacification of the pulmonary arteries to the segmental level. No evidence of pulmonary embolism. Stable cardiomegaly. No pericardial effusion. Normal caliber thoracic aorta. No thoracic aortic atherosclerosis or dissection. Left-sided Port-A-Cath again noted. Mediastinum/Nodes: Thyroid gland, trachea, and esophagus demonstrate no significant findings. Multiple small prevascular lymph nodes unchanged  compared with the prior exams. Mildly enlarged stable subcarinal lymph node. No axillary lymphadenopathy. Lungs/Pleura: Patchy areas of ground-glass opacities in bilateral upper lobes and lower lobes which may reflect mild pulmonary edema versus alveolitis secondary to an infectious or inflammatory etiology. No pleural effusion or pneumothorax. Upper Abdomen: No acute abnormality in the upper abdomen. Chronic calcified, involuted spleen. Musculoskeletal: No acute osseous abnormality. No lytic or sclerotic osseous lesion. Soft tissue: Bilateral retroareolar fibroglandular soft tissue as can be seen with gynecomastia. Review of the MIP images confirms the above findings. IMPRESSION: 1. No evidence of acute pulmonary embolus. 2. Stable cardiomegaly. 3. Patchy areas of ground-glass opacities in bilateral upper lobes and lower lobes which may reflect mild pulmonary edema versus alveolitis secondary to an infectious or inflammatory etiology. Electronically Signed   By: Kathreen Devoid   On: 03/13/2016 10:13    Signed:  [name] MD, MHA, FACP, FAAP, CPE   04/05/2016, 4:32 PM

## 2016-04-05 NOTE — Discharge Instructions (Signed)
Sickle Cell Anemia, Adult °Sickle cell anemia is a condition where your red blood cells are shaped like sickles. Red blood cells carry oxygen through the body. Sickle-shaped red blood cells do not live as long as normal red blood cells. They also clump together and block blood from flowing through the blood vessels. These things prevent the body from getting enough oxygen. Sickle cell anemia causes organ damage and pain. It also increases the risk of infection. °Follow these instructions at home: °· Drink enough fluid to keep your pee (urine) clear or pale yellow. Drink more in hot weather and during exercise. °· Do not smoke. Smoking lowers oxygen levels in the blood. °· Only take over-the-counter or prescription medicines as told by your doctor. °· Take antibiotic medicines as told by your doctor. Make sure you finish them even if you start to feel better. °· Take supplements as told by your doctor. °· Consider wearing a medical alert bracelet. This tells anyone caring for you in an emergency of your condition. °· When traveling, keep your medical information, doctors' names, and the medicines you take with you at all times. °· If you have a fever, do not take fever medicines right away. This could cover up a problem. Tell your doctor. °· Keep all follow-up visits with your doctor. Sickle cell anemia requires regular medical care. °Contact a doctor if: °You have a fever. °Get help right away if: °· You feel dizzy or faint. °· You have new belly (abdominal) pain, especially on the left side near the stomach area. °· You have a lasting, often uncomfortable and painful erection of the penis (priapism). If it is not treated right away, you will become unable to have sex (impotence). °· You have numbness in your arms or legs or you have a hard time moving them. °· You have a hard time talking. °· You have a fever or lasting symptoms for more than 2-3 days. °· You have a fever and your symptoms suddenly get  worse. °· You have signs or symptoms of infection. These include: °? Chills. °? Being more tired than normal (lethargy). °? Irritability. °? Poor eating. °? Throwing up (vomiting). °· You have pain that is not helped with medicine. °· You have shortness of breath. °· You have pain in your chest. °· You are coughing up pus-like or bloody mucus. °· You have a stiff neck. °· Your feet or hands swell or have pain. °· Your belly looks bloated. °· Your joints hurt. °This information is not intended to replace advice given to you by your health care provider. Make sure you discuss any questions you have with your health care provider. °Document Released: 11/07/2012 Document Revised: 06/25/2015 Document Reviewed: 08/29/2012 °Elsevier Interactive Patient Education © 2017 [employer] ° °

## 2016-04-06 IMAGING — CR DG CHEST 2V
2 series · 2 of 2 positions shown · non-contrast
Comparison: 05/13/2013

CLINICAL DATA: Acute chest syndrome.

EXAM:
CHEST  2 VIEW

[w chest pa *]
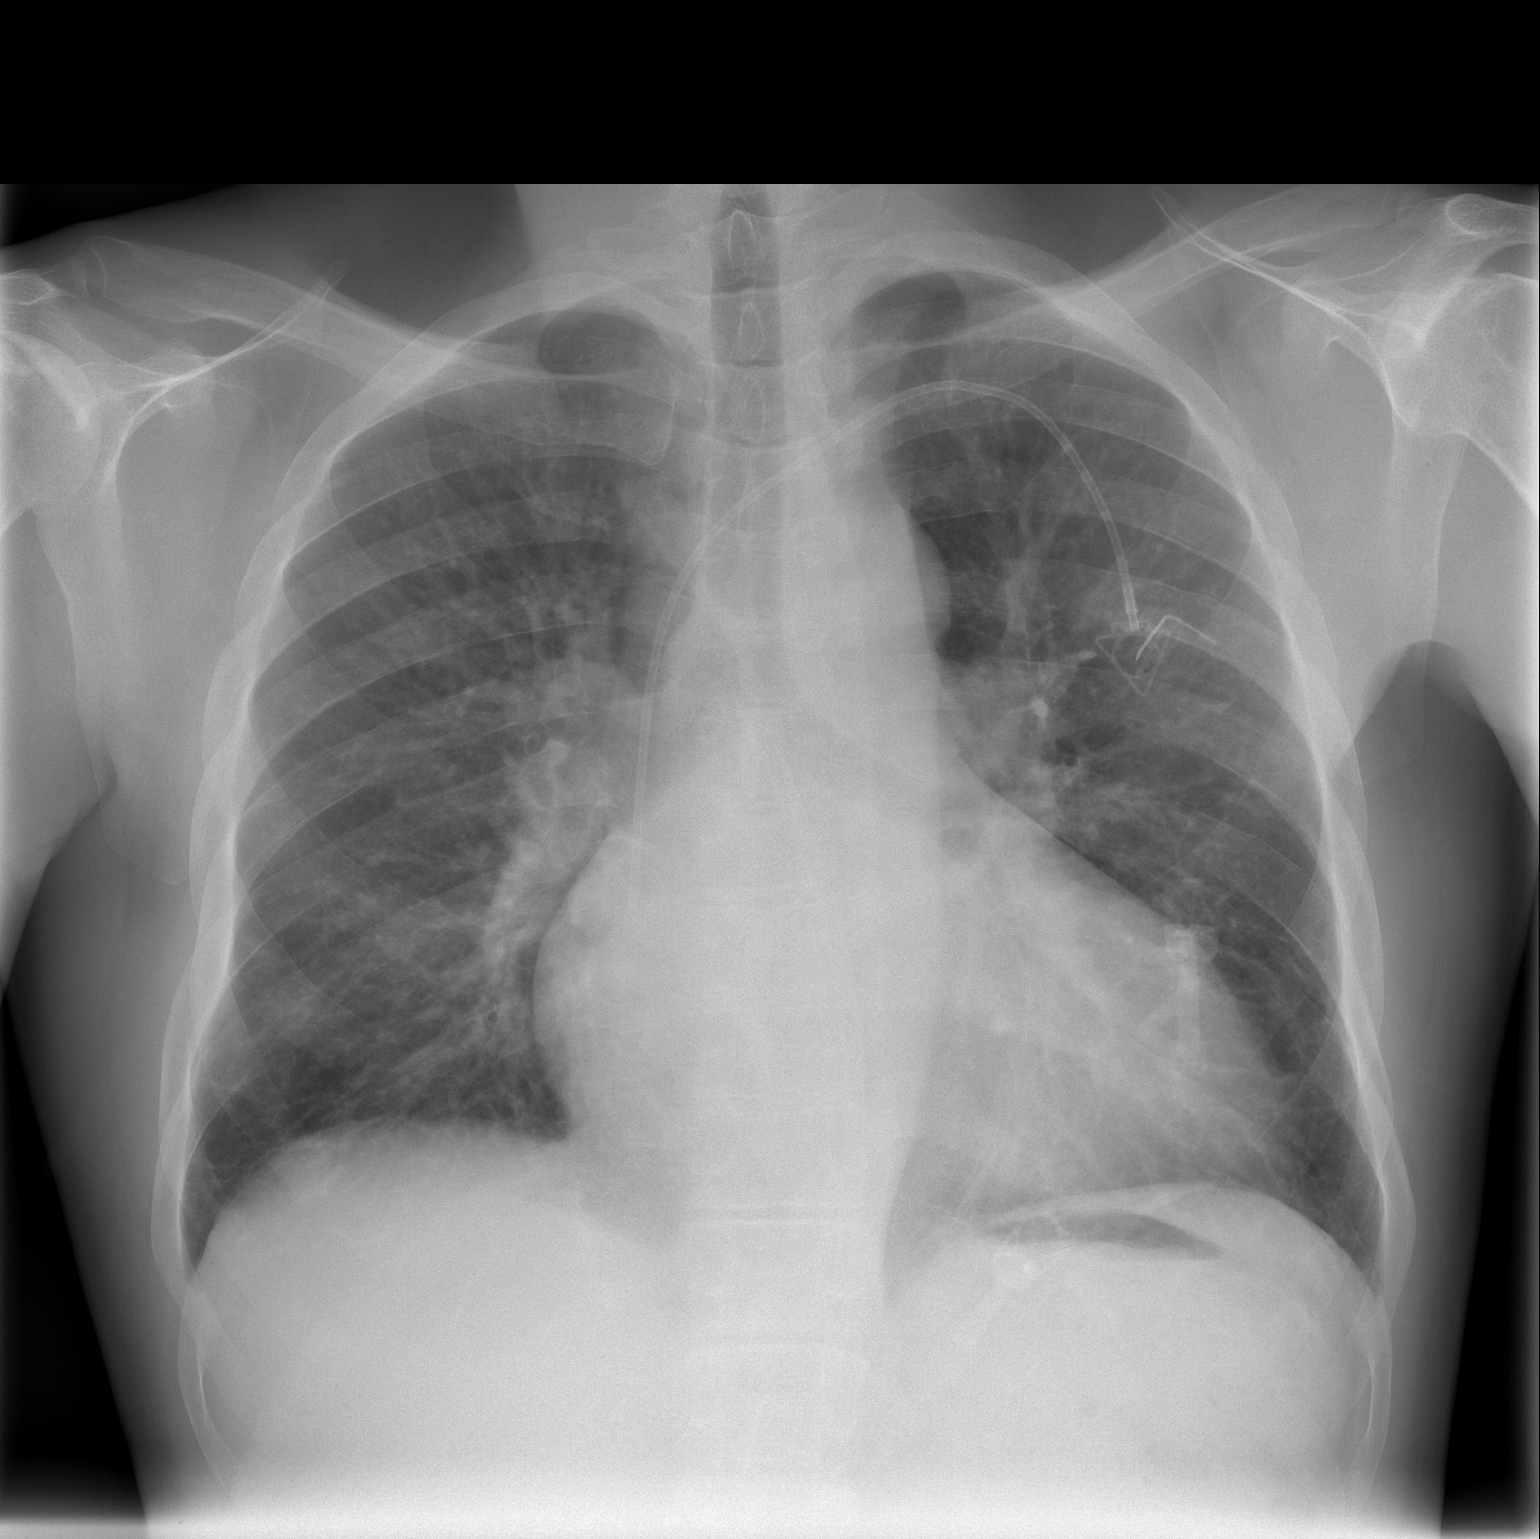

[w chest lat]
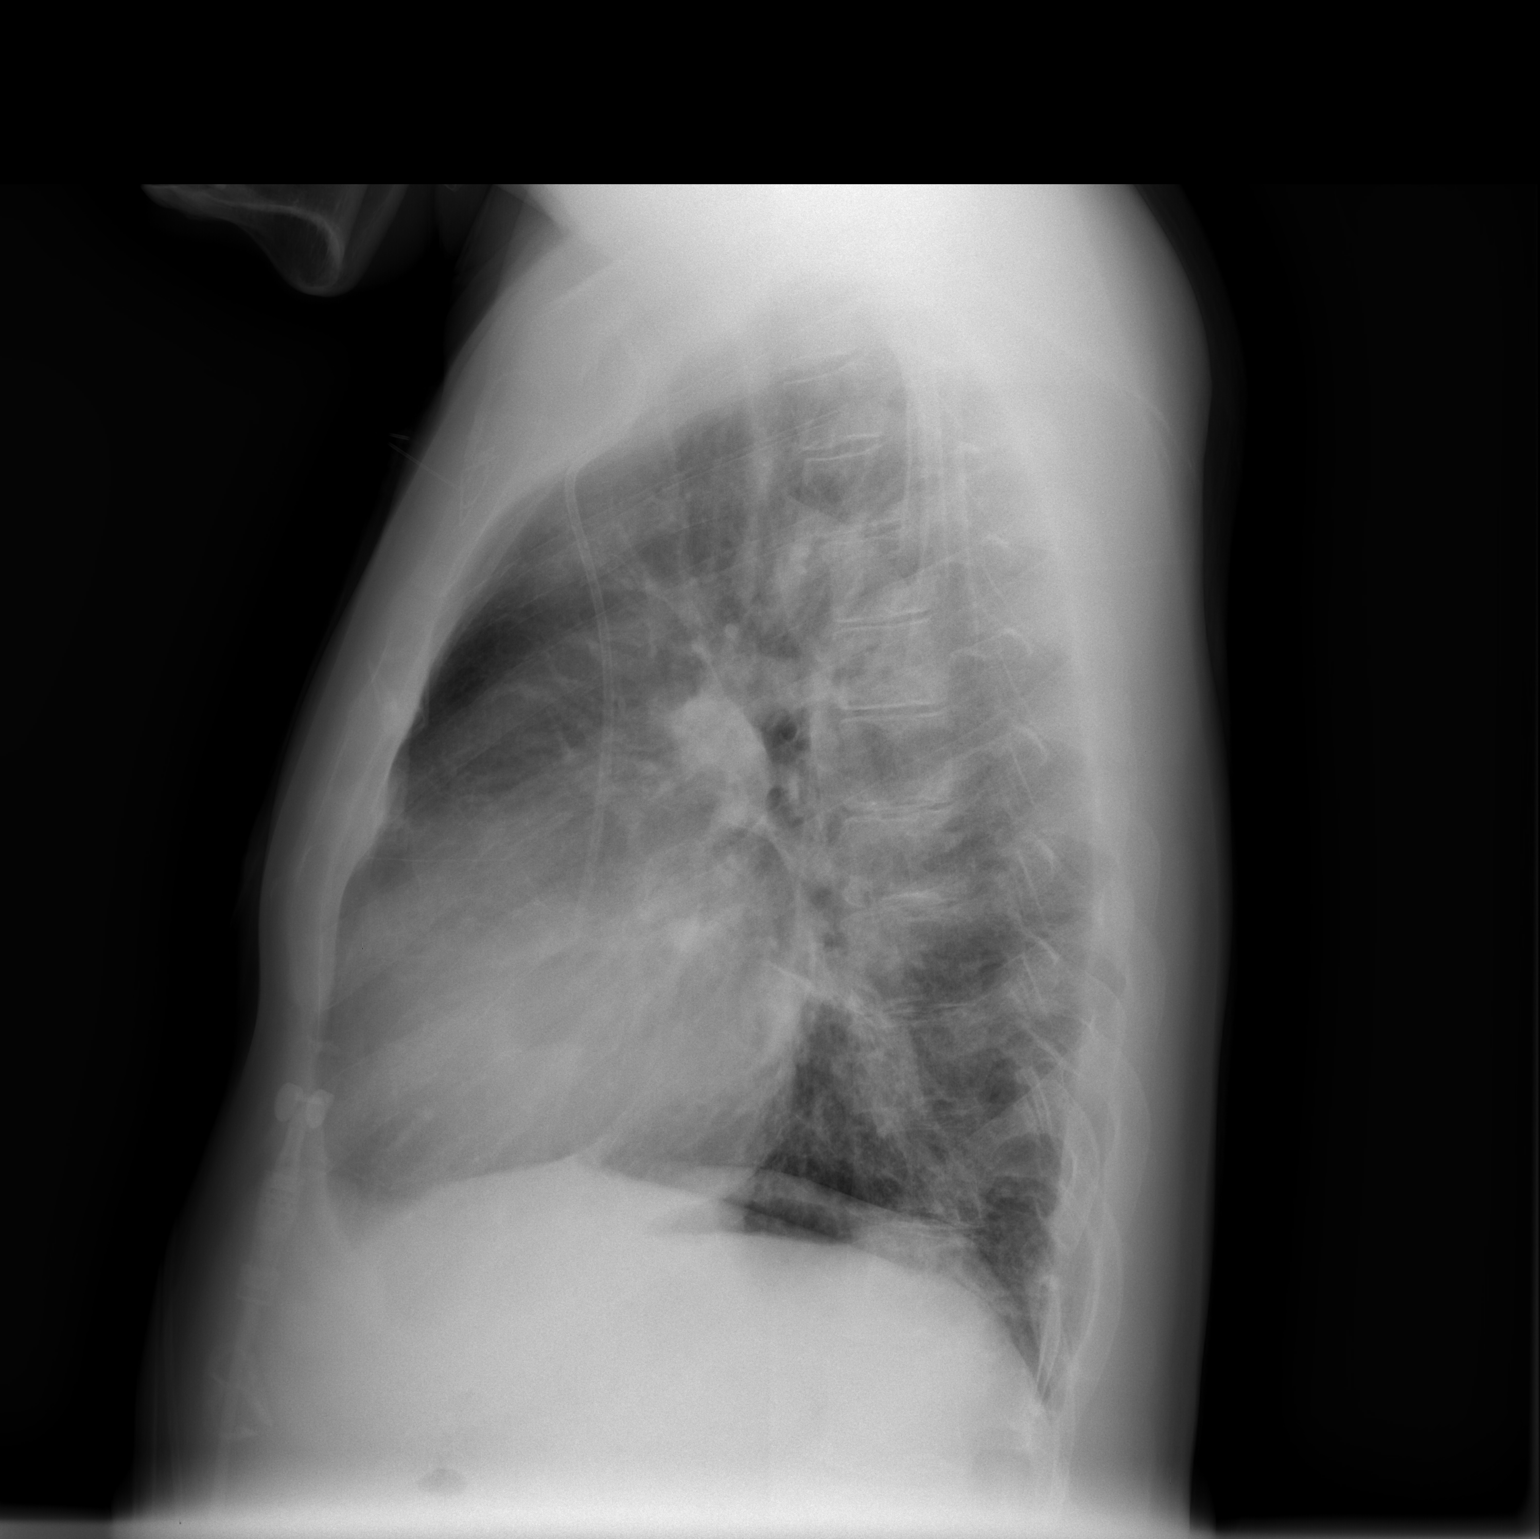

[2 of 2 positions shown; findings below may reference images not displayed]

FINDINGS: Heart is enlarged. Patient also sided power port, tip overlying the
level of the lower superior vena cava. There mild perihilar changes
of pulmonary edema. No focal consolidations or pleural effusions are
identified.
IMPRESSION: Cardiomegaly and mild pulmonary edema.

## 2016-04-07 ENCOUNTER — Telehealth (HOSPITAL_COMMUNITY): Payer: Self-pay | Admitting: *Deleted

## 2016-04-07 ENCOUNTER — Non-Acute Institutional Stay (HOSPITAL_BASED_OUTPATIENT_CLINIC_OR_DEPARTMENT_OTHER)
Admission: AD | Admit: 2016-04-07 | Discharge: 2016-04-07 | Disposition: A | Discharge status: Home / Self Care | Payer: Medicare Other | Source: Ambulatory Visit | Attending: Internal Medicine | Admitting: Internal Medicine

## 2016-04-07 ENCOUNTER — Encounter (HOSPITAL_COMMUNITY): Payer: Self-pay | Admitting: *Deleted

## 2016-04-07 ENCOUNTER — Encounter (HOSPITAL_COMMUNITY): Payer: Self-pay

## 2016-04-07 ENCOUNTER — Emergency Department (HOSPITAL_COMMUNITY)
Admission: EM | Admit: 2016-04-07 | Discharge: 2016-04-07 | Disposition: A | Discharge status: Home / Self Care | Payer: Medicare Other | Source: Home / Self Care | Attending: Emergency Medicine | Admitting: Emergency Medicine

## 2016-04-07 DIAGNOSIS — Z7982 Long term (current) use of aspirin: Secondary | ICD-10-CM

## 2016-04-07 DIAGNOSIS — D72829 Elevated white blood cell count, unspecified: Secondary | ICD-10-CM | POA: Diagnosis not present

## 2016-04-07 DIAGNOSIS — Z79899 Other long term (current) drug therapy: Secondary | ICD-10-CM | POA: Insufficient documentation

## 2016-04-07 DIAGNOSIS — J810 Acute pulmonary edema: Secondary | ICD-10-CM | POA: Diagnosis present

## 2016-04-07 DIAGNOSIS — Z86711 Personal history of pulmonary embolism: Secondary | ICD-10-CM

## 2016-04-07 DIAGNOSIS — I2721 Secondary pulmonary arterial hypertension: Secondary | ICD-10-CM | POA: Diagnosis present

## 2016-04-07 DIAGNOSIS — I2781 Cor pulmonale (chronic): Secondary | ICD-10-CM | POA: Diagnosis not present

## 2016-04-07 DIAGNOSIS — R0902 Hypoxemia: Secondary | ICD-10-CM | POA: Diagnosis not present

## 2016-04-07 DIAGNOSIS — Z86718 Personal history of other venous thrombosis and embolism: Secondary | ICD-10-CM | POA: Diagnosis not present

## 2016-04-07 DIAGNOSIS — I10 Essential (primary) hypertension: Secondary | ICD-10-CM | POA: Diagnosis present

## 2016-04-07 DIAGNOSIS — J9621 Acute and chronic respiratory failure with hypoxia: Secondary | ICD-10-CM | POA: Diagnosis not present

## 2016-04-07 DIAGNOSIS — Z8673 Personal history of transient ischemic attack (TIA), and cerebral infarction without residual deficits: Secondary | ICD-10-CM | POA: Insufficient documentation

## 2016-04-07 DIAGNOSIS — I272 Pulmonary hypertension, unspecified: Secondary | ICD-10-CM

## 2016-04-07 DIAGNOSIS — D5700 Hb-SS disease with crisis, unspecified: Secondary | ICD-10-CM | POA: Diagnosis not present

## 2016-04-07 DIAGNOSIS — Z7901 Long term (current) use of anticoagulants: Secondary | ICD-10-CM

## 2016-04-07 DIAGNOSIS — Z9114 Patient's other noncompliance with medication regimen: Secondary | ICD-10-CM | POA: Insufficient documentation

## 2016-04-07 DIAGNOSIS — Z79891 Long term (current) use of opiate analgesic: Secondary | ICD-10-CM | POA: Insufficient documentation

## 2016-04-07 DIAGNOSIS — Z96641 Presence of right artificial hip joint: Secondary | ICD-10-CM | POA: Diagnosis present

## 2016-04-07 DIAGNOSIS — R52 Pain, unspecified: Secondary | ICD-10-CM | POA: Diagnosis not present

## 2016-04-07 DIAGNOSIS — Z9119 Patient's noncompliance with other medical treatment and regimen: Secondary | ICD-10-CM

## 2016-04-07 DIAGNOSIS — Z87891 Personal history of nicotine dependence: Secondary | ICD-10-CM

## 2016-04-07 DIAGNOSIS — R0789 Other chest pain: Secondary | ICD-10-CM | POA: Diagnosis not present

## 2016-04-07 LAB — CBC WITH DIFFERENTIAL/PLATELET
BASOS ABS: 0 10*3/uL (ref 0.0–0.1)
BASOS PCT: 0 %
BLASTS: 0 %
Band Neutrophils: 0 %
EOS ABS: 0.2 10*3/uL (ref 0.0–0.7)
Eosinophils Relative: 1 %
HCT: 23.7 % — ABNORMAL LOW (ref 39.0–52.0)
Hemoglobin: 8.2 g/dL — ABNORMAL LOW (ref 13.0–17.0)
LYMPHS ABS: 2.1 10*3/uL (ref 0.7–4.0)
Lymphocytes Relative: 13 %
MCH: 30.9 pg (ref 26.0–34.0)
MCHC: 34.6 g/dL (ref 30.0–36.0)
MCV: 89.4 fL (ref 78.0–100.0)
MONO ABS: 1.3 10*3/uL — AB (ref 0.1–1.0)
MONOS PCT: 8 %
Metamyelocytes Relative: 0 %
Myelocytes: 0 %
NEUTROS PCT: 78 %
Neutro Abs: 12.6 10*3/uL — ABNORMAL HIGH (ref 1.7–7.7)
PROMYELOCYTES ABS: 0 %
Platelets: 627 10*3/uL — ABNORMAL HIGH (ref 150–400)
RBC: 2.65 MIL/uL — ABNORMAL LOW (ref 4.22–5.81)
RDW: 26.7 % — AB (ref 11.5–15.5)
WBC: 16.2 10*3/uL — ABNORMAL HIGH (ref 4.0–10.5)
nRBC: 0 /100 WBC

## 2016-04-07 LAB — COMPREHENSIVE METABOLIC PANEL
ALT: 24 U/L (ref 17–63)
ANION GAP: 5 (ref 5–15)
AST: 36 U/L (ref 15–41)
Albumin: 4.3 g/dL (ref 3.5–5.0)
Alkaline Phosphatase: 106 U/L (ref 38–126)
BUN: 8 mg/dL (ref 6–20)
CO2: 24 mmol/L (ref 22–32)
Calcium: 9 mg/dL (ref 8.9–10.3)
Chloride: 105 mmol/L (ref 101–111)
Creatinine, Ser: 0.72 mg/dL (ref 0.61–1.24)
GFR calc Af Amer: >60 mL/min (ref >60)
GFR calc non Af Amer: >60 mL/min (ref >60)
Glucose, Bld: 106 mg/dL — ABNORMAL HIGH (ref 65–99)
POTASSIUM: 3.6 mmol/L (ref 3.5–5.1)
SODIUM: 134 mmol/L — AB (ref 135–145)
Total Bilirubin: 4.7 mg/dL — ABNORMAL HIGH (ref 0.3–1.2)
Total Protein: 8 g/dL (ref 6.5–8.1)

## 2016-04-07 LAB — RETICULOCYTES
RBC.: 2.65 MIL/uL — AB (ref 4.22–5.81)
RETIC COUNT ABSOLUTE: 429.3 10*3/uL — AB (ref 19.0–186.0)
RETIC CT PCT: 16.2 % — AB (ref 0.4–3.1)

## 2016-04-07 MED ORDER — DIPHENHYDRAMINE HCL 25 MG PO CAPS: 25.0000 mg | ORAL_CAPSULE | ORAL | Status: DC | PRN

## 2016-04-07 MED ORDER — HEPARIN SOD (PORK) LOCK FLUSH 100 UNIT/ML IV SOLN
500.0000 [IU] | INTRAVENOUS | Status: DC | PRN
  Filled 2016-04-07: qty 5

## 2016-04-07 MED ORDER — ONDANSETRON HCL 4 MG/2ML IJ SOLN
4.0000 mg | Freq: Four times a day (QID) | INTRAMUSCULAR | Status: DC | PRN
Start: 2016-04-07 — End: 2016-04-07

## 2016-04-07 MED ORDER — HEPARIN SOD (PORK) LOCK FLUSH 100 UNIT/ML IV SOLN
500.0000 [IU] | INTRAVENOUS | Status: AC | PRN
  Administered 2016-04-07: 500 [IU]

## 2016-04-07 MED ORDER — SODIUM CHLORIDE 0.9% FLUSH: 9.0000 mL | INTRAVENOUS | Status: DC | PRN

## 2016-04-07 MED ORDER — NALOXONE HCL 0.4 MG/ML IJ SOLN: 0.4000 mg | INTRAMUSCULAR | Status: DC | PRN

## 2016-04-07 MED ORDER — SODIUM CHLORIDE 0.9% FLUSH: 10.0000 mL | INTRAVENOUS | Status: DC | PRN

## 2016-04-07 MED ORDER — SODIUM CHLORIDE 0.9 % IV SOLN
25.0000 mg | INTRAVENOUS | Status: DC | PRN
  Filled 2016-04-07: qty 0.5

## 2016-04-07 MED ORDER — HYDROMORPHONE 1 MG/ML IV SOLN
INTRAVENOUS | Status: DC
  Administered 2016-04-07: 12:00:00 via INTRAVENOUS
  Administered 2016-04-07: 14.3 mg via INTRAVENOUS
  Administered 2016-04-07: 0.7 mg via INTRAVENOUS
  Filled 2016-04-07: qty 25

## 2016-04-07 MED ORDER — KETOROLAC TROMETHAMINE 30 MG/ML IJ SOLN
30.0000 mg | Freq: Once | INTRAMUSCULAR | Status: AC
  Administered 2016-04-07: 30 mg via INTRAVENOUS
  Filled 2016-04-07: qty 1

## 2016-04-07 MED ORDER — DEXTROSE-NACL 5-0.45 % IV SOLN
INTRAVENOUS | Status: DC
  Administered 2016-04-07: 12:00:00 via INTRAVENOUS

## 2016-04-07 NOTE — Discharge Instructions (Signed)
Go to sickle cell clinic now 

## 2016-04-07 NOTE — ED Notes (Signed)
Unable to collect labs at this time PT currently not in rm 

## 2016-04-07 NOTE — ED Provider Notes (Signed)
Patient complains of sickle cell pain. Patient has pain in his upper back bilaterally. Patient's vital signs are all normal. I spoke with the sickle clinic and they have accepted him to be treated over in the sickle cell clinic. He will be sent over there immediately   Milton Ferguson, MD 04/07/16 1126

## 2016-04-07 NOTE — Telephone Encounter (Signed)
Pt called, requesting to come to the [facility] for treatment. Pt states his pain never went away and he has been taking his meds as prescribed. He last took his dilaudid this morning and every 4 hours. He has pain in his back and ankles. He states his pain is 8/10. He denies fever, chest pain, nausea, vomiting, diarrhea or abd pain. Will check with the provider and give him a call back. Pt voiced understanding.

## 2016-04-07 NOTE — ED Triage Notes (Signed)
Patient states he has bilateral rib cage pain. Since this AM. Patient denies SOB and chest pain.

## 2016-04-07 NOTE — ED Notes (Signed)
Patient declined Dilaudid [place] when offered.  Patient stated, "I will wait."

## 2016-04-07 NOTE — Discharge Instructions (Signed)
Sickle Cell Anemia, Adult °Sickle cell anemia is a condition where your red blood cells are shaped like sickles. Red blood cells carry oxygen through the body. Sickle-shaped red blood cells do not live as long as normal red blood cells. They also clump together and block blood from flowing through the blood vessels. These things prevent the body from getting enough oxygen. Sickle cell anemia causes organ damage and pain. It also increases the risk of infection. °Follow these instructions at home: °· Drink enough fluid to keep your pee (urine) clear or pale yellow. Drink more in hot weather and during exercise. °· Do not smoke. Smoking lowers oxygen levels in the blood. °· Only take over-the-counter or prescription medicines as told by your doctor. °· Take antibiotic medicines as told by your doctor. Make sure you finish them even if you start to feel better. °· Take supplements as told by your doctor. °· Consider wearing a medical alert bracelet. This tells anyone caring for you in an emergency of your condition. °· When traveling, keep your medical information, doctors' names, and the medicines you take with you at all times. °· If you have a fever, do not take fever medicines right away. This could cover up a problem. Tell your doctor. °· Keep all follow-up visits with your doctor. Sickle cell anemia requires regular medical care. °Contact a doctor if: °You have a fever. °Get help right away if: °· You feel dizzy or faint. °· You have new belly (abdominal) pain, especially on the left side near the stomach area. °· You have a lasting, often uncomfortable and painful erection of the penis (priapism). If it is not treated right away, you will become unable to have sex (impotence). °· You have numbness in your arms or legs or you have a hard time moving them. °· You have a hard time talking. °· You have a fever or lasting symptoms for more than 2-3 days. °· You have a fever and your symptoms suddenly get  worse. °· You have signs or symptoms of infection. These include: °? Chills. °? Being more tired than normal (lethargy). °? Irritability. °? Poor eating. °? Throwing up (vomiting). °· You have pain that is not helped with medicine. °· You have shortness of breath. °· You have pain in your chest. °· You are coughing up pus-like or bloody mucus. °· You have a stiff neck. °· Your feet or hands swell or have pain. °· Your belly looks bloated. °· Your joints hurt. °This information is not intended to replace advice given to you by your health care provider. Make sure you discuss any questions you have with your health care provider. °Document Released: 11/07/2012 Document Revised: 06/25/2015 Document Reviewed: 08/29/2012 °Elsevier Interactive Patient Education © 2017 [employer] ° °

## 2016-04-07 NOTE — Discharge Summary (Signed)
[facility] Discharge Summary   Patient ID: [name] MRN: [id number] DOB/AGE: 02-07-1980 36 y.o.  Admit date: 04/07/2016 Discharge date: 04/07/2016  Primary Care Physician:  [name], MD  Admission Diagnoses:  Active Problems:   Sickle cell pain crisis [facility])  Discharge Medications:  Current Meds  Medication Sig  . ambrisentan (LETAIRIS) 5 MG tablet Take 5 mg by mouth daily.   Marland Kitchen aspirin 81 MG chewable tablet Chew 1 tablet (81 mg total) by mouth daily.  . carvedilol (COREG) 3.125 MG tablet Take 1 tablet (3.125 mg total) by mouth 2 (two) times daily with a meal.  . cholecalciferol (VITAMIN D) 1000 units tablet Take 2,000 Units by mouth daily.  . folic acid (FOLVITE) 1 MG tablet Take 1 mg by mouth daily.  Marland Kitchen gabapentin (NEURONTIN) 300 MG capsule Take 1 capsule (300 mg total) by mouth 3 (three) times daily.  Marland Kitchen HYDROmorphone (DILAUDID) 4 MG tablet Take 1 tablet (4 mg total) by mouth every 4 (four) hours as needed for moderate pain or severe pain.  . hydroxyurea (HYDREA) 500 MG capsule Take 2 capsules (1,000 mg total) by mouth daily. May take with food to minimize GI side effects.  Marland Kitchen morphine (MS CONTIN) 30 MG 12 hr tablet Take 1 tablet (30 mg total) by mouth every 12 (twelve) hours.  . rivaroxaban (XARELTO) 20 MG TABS tablet Take 1 tablet (20 mg total) by mouth at bedtime.  Marland Kitchen zolpidem (AMBIEN) 10 MG tablet Take 1 tablet (10 mg total) by mouth at bedtime as needed for sleep.     Consults:  None  Significant Diagnostic Studies:  Dg Chest 2 View  Result Date: 03/27/2016 CLINICAL DATA:  Left chest pain.  Sickle cell anemia EXAM: CHEST  2 VIEW COMPARISON:  03/22/2016 FINDINGS: Cardiac enlargement with vascular congestion. Negative for edema or effusion. Negative for pneumonia. Port-A-Cath tip in the SVC. IMPRESSION: Cardiac enlargement with pulmonary vascular congestion. Negative for pneumonia or edema. Electronically Signed   By: Franchot Gallo M.D.   On:  03/27/2016 16:02   Dg Chest 2 View  Result Date: 03/22/2016 CLINICAL DATA:  Chest/bilateral rib pain since last night. Sickle cell crisis. EXAM: CHEST  2 VIEW COMPARISON:  Chest CT 03/13/2016 and radiographs 03/11/2016 FINDINGS: Left subclavian Port-A-Cath remains in place, with tip overlying the lower SVC. Cardiac silhouette remains moderately enlarged, unchanged. Mild diffuse chronic interstitial accentuation is unchanged. Focal scarring is noted in the right mid lung. There is no evidence of acute airspace consolidation, pulmonary edema, pleural effusion, or pneumothorax. No acute osseous abnormality is seen. IMPRESSION: No active cardiopulmonary disease. Electronically Signed   By: Logan Bores M.D.   On: 03/22/2016 08:18   Dg Chest 2 View  Result Date: 03/11/2016 CLINICAL DATA:  Sickle cell pain crisis with bilateral chest pain beginning this morning. EXAM: CHEST  2 VIEW COMPARISON:  03/06/2016 FINDINGS: Moderate cardiomegaly remains stable. Left-sided power port remains in appropriate position. Mild scarring and peripheral right midlung is unchanged. No evidence of acute pulmonary infiltrate or edema. No evidence of pleural effusion. IMPRESSION: Stable cardiomegaly.  No active lung disease. Electronically Signed   By: Earle Gell M.D.   On: 03/11/2016 08:55   Ct Angio Chest Pe W And/or Wo Contrast  Result Date: 03/13/2016 CLINICAL DATA:  History of sickle cell. Left-sided chest pain starting this morning about 3 a.m. EXAM: CT ANGIOGRAPHY CHEST WITH CONTRAST TECHNIQUE: Multidetector CT imaging of the chest was performed using the standard protocol during bolus administration  of intravenous contrast. Multiplanar CT image reconstructions and MIPs were obtained to evaluate the vascular anatomy. CONTRAST:  100 mL Isovue 370 COMPARISON:  CT chest 03/06/2016, 02/21/2016, 01/05/2016 FINDINGS: Cardiovascular: Satisfactory opacification of the pulmonary arteries to the segmental level. No evidence of  pulmonary embolism. Stable cardiomegaly. No pericardial effusion. Normal caliber thoracic aorta. No thoracic aortic atherosclerosis or dissection. Left-sided Port-A-Cath again noted. Mediastinum/Nodes: Thyroid gland, trachea, and esophagus demonstrate no significant findings. Multiple small prevascular lymph nodes unchanged compared with the prior exams. Mildly enlarged stable subcarinal lymph node. No axillary lymphadenopathy. Lungs/Pleura: Patchy areas of ground-glass opacities in bilateral upper lobes and lower lobes which may reflect mild pulmonary edema versus alveolitis secondary to an infectious or inflammatory etiology. No pleural effusion or pneumothorax. Upper Abdomen: No acute abnormality in the upper abdomen. Chronic calcified, involuted spleen. Musculoskeletal: No acute osseous abnormality. No lytic or sclerotic osseous lesion. Soft tissue: Bilateral retroareolar fibroglandular soft tissue as can be seen with gynecomastia. Review of the MIP images confirms the above findings. IMPRESSION: 1. No evidence of acute pulmonary embolus. 2. Stable cardiomegaly. 3. Patchy areas of ground-glass opacities in bilateral upper lobes and lower lobes which may reflect mild pulmonary edema versus alveolitis secondary to an infectious or inflammatory etiology. Electronically Signed   By: Kathreen Devoid   On: 03/13/2016 10:13     [facility] Course: [name] was admitted to the day infusion center for extended observation. Pt was treated with IVF, Toradol and IV analgesics. Pt was treated with a weight based PCA Dilaudid. He has total use of 15 with 29 demands and 20 deliveries. Pain has decreased from 9/10 to 6/10. Pt will be discharged home on current medication regimen.  Patient does not have a follow up appointment scheduled with primary care. Will schedule appointment prior to discharge.   [name] is alert, oriented, and ambulating.   The patient was given clear instructions to go to ER  or return to medical center if symptoms do not improve, worsen or new problems develop. The patient verbalized understanding.  Physical Exam at Discharge:  BP 105/70 (BP Location: Left Arm)   Pulse 82   Temp 98.9 F (37.2 C) (Oral)   Resp 19   Ht 6' (1.829 m)   Wt 184 lb (83.5 kg)   SpO2 93%   BMI 24.95 kg/m    General Appearance:    Alert, cooperative, no distress, appears stated age  Head:    Normocephalic, without obvious abnormality, atraumatic  Eyes:    PERRL, conjunctiva/corneas clear, EOM's intact, fundi    benign, Icterus both eyes       Back:     Symmetric, no curvature, ROM normal, no CVA tenderness  Lungs:     Clear to auscultation bilaterally, respirations unlabored  Chest wall:    No tenderness or deformity  Heart:    Irregular rate and rhythm, S1 and S2 normal, no murmur, rub   or gallop  Abdomen:     Soft, non-tender, bowel sounds active all four quadrants,    no masses, no organomegaly  Extremities:   Extremities normal, atraumatic, no cyanosis or edema  Pulses:   2+ and symmetric all extremities  Skin:   Skin color, texture, turgor normal, no rashes or lesions  Lymph nodes:   Cervical, supraclavicular, and axillary nodes normal  Neurologic:   CNII-XII intact. Normal strength, sensation and reflexes      throughout     Disposition at Discharge: 95-DC/txfr to another health  care institution with planned acute care hosp IP readmit  Discharge Orders:   Condition at Discharge:   Stable  Time spent on Discharge:  15 minutes.  Signed: Hollis,Lachina M 04/07/2016, 3:29 PM

## 2016-04-07 NOTE — Progress Notes (Signed)
Pt discharged to home; discharge instructions explained, given, and signed; all questions answered; pt alert, ambulatory; port deaccessed; no complications noted; states pain at 4/10 upon discharge

## 2016-04-07 NOTE — H&P (Signed)
[facility] History and Physical   Date: 04/07/2016  Patient name: [name] Medical record number: [id number] Date of birth: 24-Apr-1980 Age: 36 y.o. Gender: male PCP: [name], MD  Attending physician: Tresa Garter, MD  Chief Complaint: Generalized pain  History of Present Illness: [name], a 36 year old male with a history of sickle cell anemia, HbSS presents complaining of generalized pain that is consistent with sickle cell anemia. He says that he has pain primarily to both ribs and neck. [name] was evaluated in the emergency department this am and transitioned. He did not receive any treatment in the emergency department prior to arrival. Pain intensity is 9/10 described as constant and throbbing. He last had home medications around 6 am without sustained relief. He says that he is taking all other medications consistently.   Meds: Prescriptions Prior to Admission  Medication Sig Dispense Refill Last Dose  . ambrisentan (LETAIRIS) 5 MG tablet Take 5 mg by mouth daily.    04/06/2016 at Unknown time  . aspirin 81 MG chewable tablet Chew 1 tablet (81 mg total) by mouth daily. 30 tablet 11 04/06/2016 at Unknown time  . carvedilol (COREG) 3.125 MG tablet Take 1 tablet (3.125 mg total) by mouth 2 (two) times daily with a meal. 30 tablet 1 04/06/2016 at 0900  . cholecalciferol (VITAMIN D) 1000 units tablet Take 2,000 Units by mouth daily.   04/06/2016 at Unknown time  . Deferasirox (JADENU) 360 MG TABS Take 1,080 mg by mouth daily.    04/06/2016 at Unknown time  . folic acid (FOLVITE) 1 MG tablet Take 1 mg by mouth daily.   04/06/2016 at Unknown time  . gabapentin (NEURONTIN) 300 MG capsule Take 1 capsule (300 mg total) by mouth 3 (three) times daily. 270 capsule 3 04/06/2016 at Unknown time  . HYDROmorphone (DILAUDID) 4 MG tablet Take 1 tablet (4 mg total) by mouth every 4 (four) hours as needed for moderate pain or severe pain. 90 tablet 0 04/07/2016 at  0730  . hydroxyurea (HYDREA) 500 MG capsule Take 2 capsules (1,000 mg total) by mouth daily. May take with food to minimize GI side effects.   04/06/2016 at Unknown time  . morphine (MS CONTIN) 30 MG 12 hr tablet Take 1 tablet (30 mg total) by mouth every 12 (twelve) hours. 60 tablet 0 04/07/2016 at 0730  . rivaroxaban (XARELTO) 20 MG TABS tablet Take 1 tablet (20 mg total) by mouth at bedtime. 30 tablet 3 04/06/2016 at 2100  . zolpidem (AMBIEN) 10 MG tablet Take 1 tablet (10 mg total) by mouth at bedtime as needed for sleep. 30 tablet 0 Past Week at Unknown time    Allergies: Patient has no known allergies. Past Medical History:  Diagnosis Date  . Acute chest syndrome ([place]) 06/18/2013  . Acute embolism and thrombosis of right internal jugular vein ([place])   . Alcohol consumption of one to four drinks per day   . Avascular necrosis (HCC)    Right Hip  . Blood transfusion   . Chronic anticoagulation   . Demand ischemia ([employer]) 01/02/2014  . Former smoker   . Functional asplenia   . Hb-SS disease with crisis ([place])   . History of Clostridium difficile infection   . History of pulmonary embolus (PE)   . Hypertension   . Hypokalemia   . Leukocytosis    Chronic  . Mood disorder ([place])   . Noncompliance with medication regimen   .  Oxygen deficiency   . Pulmonary hypertension   . Second hand tobacco smoke exposure   . Sickle cell anemia (HCC)   . Sickle-cell crisis with associated acute chest syndrome ([place]) 05/13/2013  . Stroke ([place])   . Thrombocytosis (HCC)    Chronic  . Uses marijuana    Past Surgical History:  Procedure Laterality Date  . CHOLECYSTECTOMY     01/2008  . Excision of left periauricular cyst     10/2009  . Excision of right ear lobe cyst with primary closur     11/2007  . Porta cath placement    . Porta cath removal    . PORTACATH PLACEMENT  01/05/2012   Procedure: INSERTION PORT-A-CATH;  Surgeon: Odis Hollingshead, MD;  Location: [facility];  Service: General;  Laterality: N/A;   ultrasound guiced port a cath insertion with fluoroscopy  . Right hip replacement     08/2006  . UMBILICAL HERNIA REPAIR     01/2008   Family History  Problem Relation Age of Onset  . Sickle cell trait Mother   . Depression Mother   . Diabetes Mother   . Sickle cell trait Father   . Sickle cell trait Brother    Social History   Social History  . Marital status: Single    Spouse name: N/A  . Number of children: 0  . Years of education: 25   Occupational History  . 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 Smoking status: Former Smoker    Packs/day: 0.50    Years: 10.00    Types: Cigarettes    Quit date: 05/29/2011  . Smokeless tobacco: Never Used  . Alcohol use No  . Drug use: No     Comment: Marijuana weekly  . Sexual activity: Yes    Partners: Female     Comment: month ago   Other Topics Concern  . Not on file   Social History Narrative   Lives in an apartment.  Single.  Lives alone but has a girlfriend that helps care for him.  Does not use any assist devices.        [name]:  [phone] Mom, emergency contact      [facility]:   Patient continuing to live in her apartment in town alone. 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    Review of Systems: Constitutional: negative for fatigue and fevers Eyes: positive for icterus Ears, nose, mouth, throat, and face: negative Respiratory: negative for cough and dyspnea on exertion Cardiovascular: negative for chest pain, dyspnea, fatigue, palpitations, syncope and tachypnea Gastrointestinal: negative for nausea Genitourinary:negative for dysuria Integument/breast: negative Hematologic/lymphatic: negative Musculoskeletal:positive for arthralgias, back pain and muscle weakness Neurological: negative for dizziness and gait problems Behavioral/Psych: negative Endocrine: negative Allergic/Immunologic: negative  Physical Exam: There were no  vitals taken for this visit. BP 114/77 (BP Location: Left Arm)   Pulse 86   Temp 98.9 F (37.2 C) (Oral)   Ht 6' (1.829 m)   Wt 184 lb (83.5 kg)   SpO2 96%   BMI 24.95 kg/m   General Appearance:    Alert, cooperative, no distress, appears stated age  Head:    Normocephalic, without obvious abnormality, atraumatic  Throat:   Lips, mucosa, and tongue normal; teeth and gums normal  Neck:   Supple, symmetrical, trachea midline, no adenopathy;       thyroid:  No enlargement/tenderness/nodules; no  carotid   bruit or JVD  Back:     Symmetric, no curvature, ROM normal, no CVA tenderness  Lungs:     Clear to auscultation bilaterally, respirations unlabored  Chest wall:    No tenderness or deformity  Heart:    Irregular rate and rhythm, S1 and S2 normal, no murmur, rub   or gallop  Abdomen:     Soft, non-tender, bowel sounds active all four quadrants,    no masses, no organomegaly  Extremities:   Extremities normal, atraumatic, no cyanosis or edema  Pulses:   2+ and symmetric all extremities  Skin:   Skin color, texture, turgor normal, no rashes or lesions  Lymph nodes:   Cervical, supraclavicular, and axillary nodes normal  Neurologic:   CNII-XII intact. Normal strength, sensation and reflexes      throughout    Lab results: No results found. However, due to the size of the patient record, not all encounters were searched. Please check Results Review for a complete set of results.  Imaging results:  No results found.   Assessment & Plan:  Patient will be admitted to the day infusion center for extended observation  Start IV D5.45 for cellular rehydration at 100/hr  Start Dilaudid PCA High Concentration per weight based protocol.   Patient will be re-evaluated for pain intensity in the context of function and relationship to baseline as care progresses.  If no significant pain relief, will transfer patient to inpatient services for a higher level of care.   Will review labs  from emergency department   Rhylen [name] M 04/07/2016, 11:46 AM

## 2016-04-07 NOTE — Telephone Encounter (Signed)
After speaking with the provider, pt was told that he needed to stay home and continue to take his meds as prescribed. He needs to stay hydrated and warm. Pt voiced understanding.

## 2016-04-09 ENCOUNTER — Encounter (HOSPITAL_COMMUNITY): Payer: Self-pay | Admitting: Emergency Medicine

## 2016-04-09 ENCOUNTER — Inpatient Hospital Stay (HOSPITAL_COMMUNITY)
Admission: EM | Admit: 2016-04-09 | Discharge: 2016-04-26 | DRG: 811 | Disposition: A | Discharge status: Home / Self Care | Payer: Medicare Other | Attending: Internal Medicine | Admitting: Internal Medicine

## 2016-04-09 ENCOUNTER — Emergency Department (HOSPITAL_COMMUNITY): Payer: Medicare Other

## 2016-04-09 ENCOUNTER — Emergency Department (HOSPITAL_COMMUNITY)
Admission: EM | Admit: 2016-04-09 | Discharge: 2016-04-09 | Disposition: A | Discharge status: Home / Self Care | Payer: Medicare Other | Source: Home / Self Care | Attending: Emergency Medicine | Admitting: Emergency Medicine

## 2016-04-09 ENCOUNTER — Encounter (HOSPITAL_COMMUNITY): Payer: Self-pay | Admitting: Nurse Practitioner

## 2016-04-09 DIAGNOSIS — Z7901 Long term (current) use of anticoagulants: Secondary | ICD-10-CM

## 2016-04-09 DIAGNOSIS — I10 Essential (primary) hypertension: Secondary | ICD-10-CM | POA: Diagnosis present

## 2016-04-09 DIAGNOSIS — D5700 Hb-SS disease with crisis, unspecified: Secondary | ICD-10-CM

## 2016-04-09 DIAGNOSIS — Z09 Encounter for follow-up examination after completed treatment for conditions other than malignant neoplasm: Secondary | ICD-10-CM

## 2016-04-09 DIAGNOSIS — Z79899 Other long term (current) drug therapy: Secondary | ICD-10-CM

## 2016-04-09 DIAGNOSIS — Z87891 Personal history of nicotine dependence: Secondary | ICD-10-CM | POA: Insufficient documentation

## 2016-04-09 DIAGNOSIS — I2781 Cor pulmonale (chronic): Secondary | ICD-10-CM

## 2016-04-09 DIAGNOSIS — R0902 Hypoxemia: Secondary | ICD-10-CM | POA: Diagnosis not present

## 2016-04-09 DIAGNOSIS — I2721 Secondary pulmonary arterial hypertension: Secondary | ICD-10-CM

## 2016-04-09 DIAGNOSIS — D638 Anemia in other chronic diseases classified elsewhere: Secondary | ICD-10-CM | POA: Diagnosis present

## 2016-04-09 DIAGNOSIS — Z86711 Personal history of pulmonary embolism: Secondary | ICD-10-CM | POA: Diagnosis present

## 2016-04-09 DIAGNOSIS — Z8673 Personal history of transient ischemic attack (TIA), and cerebral infarction without residual deficits: Secondary | ICD-10-CM

## 2016-04-09 DIAGNOSIS — R9431 Abnormal electrocardiogram [ECG] [EKG]: Secondary | ICD-10-CM | POA: Diagnosis present

## 2016-04-09 DIAGNOSIS — Z7982 Long term (current) use of aspirin: Secondary | ICD-10-CM

## 2016-04-09 DIAGNOSIS — I272 Pulmonary hypertension, unspecified: Secondary | ICD-10-CM | POA: Diagnosis present

## 2016-04-09 DIAGNOSIS — J9611 Chronic respiratory failure with hypoxia: Secondary | ICD-10-CM

## 2016-04-09 DIAGNOSIS — J9621 Acute and chronic respiratory failure with hypoxia: Secondary | ICD-10-CM | POA: Diagnosis present

## 2016-04-09 DIAGNOSIS — J810 Acute pulmonary edema: Secondary | ICD-10-CM | POA: Diagnosis present

## 2016-04-09 DIAGNOSIS — R0789 Other chest pain: Secondary | ICD-10-CM | POA: Diagnosis not present

## 2016-04-09 LAB — CBC WITH DIFFERENTIAL/PLATELET
BASOS PCT: 0 %
Band Neutrophils: 0 %
Basophils Absolute: 0 10*3/uL (ref 0.0–0.1)
Blasts: 0 %
EOS PCT: 2 %
Eosinophils Absolute: 0.3 10*3/uL (ref 0.0–0.7)
HEMATOCRIT: 23.6 % — AB (ref 39.0–52.0)
Hemoglobin: 8.2 g/dL — ABNORMAL LOW (ref 13.0–17.0)
LYMPHS ABS: 1.4 10*3/uL (ref 0.7–4.0)
Lymphocytes Relative: 8 %
MCH: 31.4 pg (ref 26.0–34.0)
MCHC: 34.7 g/dL (ref 30.0–36.0)
MCV: 90.4 fL (ref 78.0–100.0)
MONO ABS: 0.9 10*3/uL (ref 0.1–1.0)
Metamyelocytes Relative: 0 %
Monocytes Relative: 5 %
Myelocytes: 0 %
NEUTROS PCT: 85 %
NRBC: 5 /100{WBCs} — AB
Neutro Abs: 14.8 10*3/uL — ABNORMAL HIGH (ref 1.7–7.7)
OTHER: 0 %
Platelets: 657 10*3/uL — ABNORMAL HIGH (ref 150–400)
Promyelocytes Absolute: 0 %
RBC: 2.61 MIL/uL — ABNORMAL LOW (ref 4.22–5.81)
RDW: 27.1 % — AB (ref 11.5–15.5)
WBC: 17.4 10*3/uL — ABNORMAL HIGH (ref 4.0–10.5)

## 2016-04-09 LAB — RETICULOCYTES
RBC.: 2.61 MIL/uL — ABNORMAL LOW (ref 4.22–5.81)
Retic Count, Absolute: 498.5 10*3/uL — ABNORMAL HIGH (ref 19.0–186.0)
Retic Ct Pct: 19.1 % — ABNORMAL HIGH (ref 0.4–3.1)

## 2016-04-09 LAB — BASIC METABOLIC PANEL
ANION GAP: 7 (ref 5–15)
BUN: 12 mg/dL (ref 6–20)
CO2: 20 mmol/L — AB (ref 22–32)
Calcium: 9 mg/dL (ref 8.9–10.3)
Chloride: 109 mmol/L (ref 101–111)
Creatinine, Ser: 0.9 mg/dL (ref 0.61–1.24)
GFR calc Af Amer: >60 mL/min (ref >60)
GLUCOSE: 107 mg/dL — AB (ref 65–99)
Potassium: 3.8 mmol/L (ref 3.5–5.1)
Sodium: 136 mmol/L (ref 135–145)

## 2016-04-09 MED ORDER — HYDROMORPHONE HCL 1 MG/ML IJ SOLN: 2.0000 mg | INTRAMUSCULAR | Status: DC

## 2016-04-09 MED ORDER — HYDROMORPHONE HCL 1 MG/ML IJ SOLN
2.0000 mg | INTRAMUSCULAR | Status: AC
  Administered 2016-04-10: 2 mg via INTRAVENOUS
  Filled 2016-04-09: qty 2

## 2016-04-09 MED ORDER — SODIUM CHLORIDE 0.45 % IV SOLN
INTRAVENOUS | Status: DC
  Administered 2016-04-10: via INTRAVENOUS

## 2016-04-09 MED ORDER — HYDROMORPHONE HCL 1 MG/ML IJ SOLN
2.0000 mg | INTRAMUSCULAR | Status: AC
  Administered 2016-04-09: 2 mg via INTRAVENOUS
  Filled 2016-04-09: qty 2

## 2016-04-09 MED ORDER — DIPHENHYDRAMINE HCL 50 MG/ML IJ SOLN
25.0000 mg | Freq: Once | INTRAMUSCULAR | Status: AC
  Administered 2016-04-10: 25 mg via INTRAVENOUS
  Filled 2016-04-09: qty 1

## 2016-04-09 MED ORDER — HYDROMORPHONE HCL 1 MG/ML IJ SOLN: 2.0000 mg | INTRAMUSCULAR | Status: AC

## 2016-04-09 MED ORDER — HYDROMORPHONE HCL 1 MG/ML IJ SOLN
2.0000 mg | INTRAMUSCULAR | Status: DC
  Administered 2016-04-10: 2 mg via INTRAVENOUS
  Filled 2016-04-09: qty 2

## 2016-04-09 MED ORDER — KETOROLAC TROMETHAMINE 30 MG/ML IJ SOLN
30.0000 mg | INTRAMUSCULAR | Status: AC
  Administered 2016-04-10: 30 mg via INTRAVENOUS
  Filled 2016-04-09: qty 1

## 2016-04-09 MED ORDER — DIPHENHYDRAMINE HCL 50 MG/ML IJ SOLN
25.0000 mg | Freq: Once | INTRAMUSCULAR | Status: AC
  Administered 2016-04-09: 25 mg via INTRAVENOUS
  Filled 2016-04-09: qty 1

## 2016-04-09 MED ORDER — HEPARIN SOD (PORK) LOCK FLUSH 100 UNIT/ML IV SOLN
500.0000 [IU] | Freq: Once | INTRAVENOUS | Status: AC
  Administered 2016-04-09: 500 [IU]
  Filled 2016-04-09: qty 5

## 2016-04-09 MED ORDER — PROMETHAZINE HCL 25 MG PO TABS: 25.0000 mg | ORAL_TABLET | ORAL | Status: DC | PRN

## 2016-04-09 MED ORDER — ONDANSETRON HCL 4 MG/2ML IJ SOLN
4.0000 mg | Freq: Once | INTRAMUSCULAR | Status: AC
  Administered 2016-04-09: 4 mg via INTRAVENOUS
  Filled 2016-04-09: qty 2

## 2016-04-09 MED ORDER — KETOROLAC TROMETHAMINE 30 MG/ML IJ SOLN
30.0000 mg | INTRAMUSCULAR | Status: AC
  Administered 2016-04-09: 30 mg via INTRAVENOUS
  Filled 2016-04-09: qty 1

## 2016-04-09 MED ORDER — DEXTROSE-NACL 5-0.45 % IV SOLN: INTRAVENOUS | Status: DC

## 2016-04-09 NOTE — ED Triage Notes (Signed)
Pt c/o SCC pain, states home medications did not help.

## 2016-04-09 NOTE — ED Provider Notes (Signed)
[facility] DEPT Provider Note   CSN: [id number] Arrival date & time: 04/09/16  2244  By signing my name below, I, Margit Banda, attest that this documentation has been prepared under the direction and in the presence of Orpah Greek, MD. Electronically Signed: Margit Banda, ED Scribe. 04/09/16. 11:59 PM.   History   Chief Complaint Chief Complaint  Patient presents with  . Sickle Cell Pain Crisis    HPI WLLIAM [name] is a 36 y.o. male with a PMHx of Sickle Cell anemia who presents to the Emergency Department complaining of moderate bilateral rib pain since earlier today. Associated sx include lower back pain. Pt states their current symptoms are consistent with prior sickle cell flare-ups. No alleviating or worsening factors noted. Pt is seen frequently in the ED for similar complaints. Pt denies cough, fever, sweats and chills.   The history is provided by the patient. No language interpreter was used.    Past Medical History:  Diagnosis Date  . Acute chest syndrome ([place]) 06/18/2013  . Acute embolism and thrombosis of right internal jugular vein ([place])   . Alcohol consumption of one to four drinks per day   . Avascular necrosis (HCC)    Right Hip  . Blood transfusion   . Chronic anticoagulation   . Demand ischemia ([place]) 01/02/2014  . Former smoker   . Functional asplenia   . Hb-SS disease with crisis ([place])   . History of Clostridium difficile infection   . History of pulmonary embolus (PE)   . Hypertension   . Hypokalemia   . Leukocytosis    Chronic  . Mood disorder ([place])   . Noncompliance with medication regimen   . Oxygen deficiency   . Pulmonary hypertension   . Second hand tobacco smoke exposure   . Sickle cell anemia (HCC)   . Sickle-cell crisis with associated acute chest syndrome ([place]) 05/13/2013  . Stroke ([place])   . Thrombocytosis (HCC)    Chronic  . Uses marijuana     Patient Active Problem List   Diagnosis Date Noted  . Sickle cell  anemia with pain ([place]) 04/05/2016  . Hb-SS disease with crisis ([place]) 03/27/2016  . Chest pain 03/13/2016  . H/O arterial ischemic stroke 11/11/2015  . Slurred speech 11/10/2015  . Chronic anemia   . Hb-SS disease without crisis ([place]) 10/07/2014  . Acute on chronic respiratory failure with hypoxia ([place])   . Abnormal ECG 09/10/2014  . Troponin level elevated 08/29/2014  . Cor pulmonale, chronic ([place]) 08/25/2014  . Sickle cell anemia ([place]) 06/25/2014  . Anemia of chronic disease 06/25/2014  . PAH (pulmonary artery hypertension) 03/18/2014  . Chronic respiratory failure with hypoxia ([place]) 03/14/2014  . Paralytic strabismus, external ophthalmoplegia   . Chronic pain syndrome 12/12/2013  . Chronic anticoagulation 08/22/2013  . Essential hypertension 08/22/2013  . Pulmonary HTN 06/18/2013  . Functional asplenia   . Vitamin D deficiency 02/13/2013  . Rib pain 01/13/2013  . Hx of pulmonary embolus 06/29/2012  . 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 Avascular necrosis ([place])   . Sickle cell pain crisis [facility])     Past Surgical History:  Procedure Laterality Date  . CHOLECYSTECTOMY     01/2008  . Excision of left periauricular cyst     10/2009  . Excision of right ear lobe cyst with primary closur     11/2007  . Porta cath placement    . Porta cath removal    . PORTACATH PLACEMENT  01/05/2012  Procedure: INSERTION PORT-A-CATH;  Surgeon: Odis Hollingshead, MD;  Location: [facility];  Service: General;  Laterality: N/A;  ultrasound guiced port a cath insertion with fluoroscopy  . Right hip replacement     08/2006  . UMBILICAL HERNIA REPAIR     01/2008       Home Medications    Prior to Admission medications   Medication Sig Start Date End Date Taking? Authorizing Provider  ambrisentan (LETAIRIS) 5 MG tablet Take 5 mg by mouth daily.     Historical Provider, MD  aspirin 81 MG chewable tablet Chew 1 tablet (81 mg total) by mouth daily. 07/23/14   [name], MD  carvedilol  (COREG) 3.125 MG tablet Take 1 tablet (3.125 mg total) by mouth 2 (two) times daily with a meal. 03/17/16   [name], MD  cholecalciferol (VITAMIN D) 1000 units tablet Take 2,000 Units by mouth daily.    Historical Provider, MD  Deferasirox (JADENU) 360 MG TABS Take 1,080 mg by mouth daily.     Historical Provider, MD  folic acid (FOLVITE) 1 MG tablet Take 1 mg by mouth daily.    Historical Provider, MD  gabapentin (NEURONTIN) 300 MG capsule Take 1 capsule (300 mg total) by mouth 3 (three) times daily. 06/01/15   Tresa Garter, MD  HYDROmorphone (DILAUDID) 4 MG tablet Take 1 tablet (4 mg total) by mouth every 4 (four) hours as needed for moderate pain or severe pain. 04/05/16 04/20/16  Tresa Garter, MD  hydroxyurea (HYDREA) 500 MG capsule Take 2 capsules (1,000 mg total) by mouth daily. May take with food to minimize GI side effects. 03/17/16   [name], MD  morphine (MS CONTIN) 30 MG 12 hr tablet Take 1 tablet (30 mg total) by mouth every 12 (twelve) hours. 04/05/16 05/05/16  Tresa Garter, MD  rivaroxaban (XARELTO) 20 MG TABS tablet Take 1 tablet (20 mg total) by mouth at bedtime. 12/29/15   Tresa Garter, MD  zolpidem (AMBIEN) 10 MG tablet Take 1 tablet (10 mg total) by mouth at bedtime as needed for sleep. 04/05/16 05/05/16  Tresa Garter, MD    Family History Family History  Problem Relation Age of Onset  . Sickle cell trait Mother   . Depression Mother   . Diabetes Mother   . Sickle cell trait Father   . Sickle cell trait Brother     Social History Social History  Substance Use Topics  . Smoking status: Former Smoker    Packs/day: 0.50    Years: 10.00    Types: Cigarettes    Quit date: 05/29/2011  . Smokeless tobacco: Never Used  . Alcohol use No     Allergies   Patient has no known allergies.   Review of Systems Review of Systems  Constitutional: Negative for chills and fever.  Respiratory: Negative for cough.   Cardiovascular:  Positive for chest pain (ribs).  Musculoskeletal: Positive for back pain.  All other systems reviewed and are negative.    Physical Exam Updated Vital Signs BP 120/84   Pulse 93   Temp 98.1 F (36.7 C) (Oral)   Resp 24   Ht 6' (1.829 m)   Wt 184 lb (83.5 kg)   SpO2 (!) 87%   BMI 24.95 kg/m   Physical Exam  Constitutional: He is oriented to person, place, and time. He appears well-developed and well-nourished. No distress.  HENT:  Head: Normocephalic and atraumatic.  Right Ear: Hearing normal.  Left Ear: Hearing normal.  Nose: Nose normal.  Mouth/Throat: Oropharynx is clear and moist and mucous membranes are normal.  Eyes: Conjunctivae and EOM are normal. Pupils are equal, round, and reactive to light.  Neck: Normal range of motion. Neck supple.  Cardiovascular: S1 normal and S2 normal.  Exam reveals no gallop and no friction rub.   No murmur heard. Tachycardic  Pulmonary/Chest: Effort normal and breath sounds normal. No respiratory distress. He exhibits no tenderness.  Abdominal: Soft. Normal appearance and bowel sounds are normal. There is no hepatosplenomegaly. There is no tenderness. There is no rebound, no guarding, no tenderness at McBurney's point and negative Murphy's sign. No hernia.  Musculoskeletal: Normal range of motion.  Neurological: He is alert and oriented to person, place, and time. He has normal strength. No cranial nerve deficit or sensory deficit. Coordination normal. GCS eye subscore is 4. GCS verbal subscore is 5. GCS motor subscore is 6.  Skin: Skin is warm, dry and intact. No rash noted. No cyanosis.  Psychiatric: He has a normal mood and affect. His speech is normal and behavior is normal. Thought content normal.  Nursing note and vitals reviewed.    ED Treatments / Results  DIAGNOSTIC STUDIES: Oxygen Saturation is 84% on RA, low by my interpretation.   COORDINATION OF CARE: 11:14 PM-Discussed next steps with pt which includes an CXR. Pt  verbalized understanding and is agreeable with the plan.    Labs (all labs ordered are listed, but only abnormal results are displayed) Labs Reviewed  COMPREHENSIVE METABOLIC PANEL - Abnormal; Notable for the following:       Result Value   Glucose, Bld 109 (*)    Total Bilirubin 4.8 (*)    Anion gap 4 (*)    All other components within normal limits  CBC WITH DIFFERENTIAL/PLATELET - Abnormal; Notable for the following:    WBC 23.5 (*)    RBC 2.55 (*)    Hemoglobin 8.0 (*)    HCT 22.2 (*)    RDW 24.7 (*)    Platelets 633 (*)    Neutro Abs 17.8 (*)    Monocytes Absolute 2.8 (*)    All other components within normal limits  RETICULOCYTES - Abnormal; Notable for the following:    Retic Ct Pct 20.9 (*)    RBC. 2.55 (*)    Retic Count, Manual 533.0 (*)    All other components within normal limits    EKG  EKG Interpretation None       Radiology Dg Chest 2 View  Result Date: 04/10/2016 CLINICAL DATA:  Bilateral chest wall pain. Sickle cell disease. Pain began yesterday. EXAM: CHEST  2 VIEW COMPARISON:  03/27/2016 FINDINGS: Left subclavian port extends to the low SVC. Unchanged cardiomegaly. No airspace consolidation. No pleural effusions. Pulmonary vasculature is normal. Hilar and mediastinal contours are unremarkable and unchanged. IMPRESSION: Unchanged cardiomegaly.  No consolidation or effusion. Electronically Signed   By: Andreas Newport M.D.   On: 04/10/2016 00:50    Procedures Procedures (including critical care time)  Medications Ordered in ED Medications  0.45 % sodium chloride infusion ( Intravenous New Bag/Given 04/10/16 0017)  HYDROmorphone (DILAUDID) injection 2 mg (not administered)    Or  HYDROmorphone (DILAUDID) injection 2 mg (not administered)  promethazine (PHENERGAN) tablet 25 mg (not administered)  ketorolac (TORADOL) 30 MG/ML injection 30 mg (30 mg Intravenous Given 04/10/16 0018)  HYDROmorphone (DILAUDID) injection 2 mg (2 mg Intravenous Given  04/10/16 0018)    Or  HYDROmorphone (  DILAUDID) injection 2 mg ( Subcutaneous See Alternative 04/10/16 0018)  HYDROmorphone (DILAUDID) injection 2 mg (2 mg Intravenous Given 04/10/16 0050)    Or  HYDROmorphone (DILAUDID) injection 2 mg ( Subcutaneous See Alternative 04/10/16 0050)  HYDROmorphone (DILAUDID) injection 2 mg (2 mg Intravenous Given 04/10/16 0311)    Or  HYDROmorphone (DILAUDID) injection 2 mg ( Subcutaneous See Alternative 04/10/16 0311)  diphenhydrAMINE (BENADRYL) injection 25 mg (25 mg Intravenous Given 04/10/16 0018)  diphenhydrAMINE (BENADRYL) injection 25 mg (25 mg Intravenous Given 04/10/16 0311)     Initial Impression / Assessment and Plan / ED Course  I have reviewed the triage vital signs and the nursing notes.  Pertinent labs & imaging results that were available during my care of the patient were reviewed by me and considered in my medical decision making (see chart for details).     Patient presents to the emergency department for evaluation of low back and bilateral rib pain secondary to painful crisis from sickle cell disease. Pain is similar to previous episodes. He does not have any anterior chest pain, shortness of breath, cough, fever. Chest x-ray is clear, no concern for acute chest. Patient has received multiple doses of narcotic analgesia as well as Toradol without any improvement in his pain. He does not feel like he can go home at this time. Will be admitted to the hospitalist service for further management.  Final Clinical Impressions(s) / ED Diagnoses   Final diagnoses:  Sickle cell pain crisis [facility])    New Prescriptions New Prescriptions   No medications on file   I personally performed the services described in this documentation, which was scribed in my presence. The recorded information has been reviewed and is accurate.     Orpah Greek, MD 04/10/16 [phone]

## 2016-04-09 NOTE — ED Provider Notes (Signed)
[facility] DEPT Provider Note   CSN: [id number] Arrival date & time: 04/09/16  0249     History   Chief Complaint Chief Complaint  Patient presents with  . Sickle Cell Pain Crisis    HPI [name] is a 36 y.o. male with a past medical history significant for sickle cell disease, hypertension, stroke, pulmonary embolism, and frequent pain crises who presents with sickle cell pain. Patient reports pain in his bilateral flanks and bilateral ankles. He reports this is a frequent pain distribution for him. He says that his last flare was 2 days ago requiring him to come to the ED for management. He says that he had his pain under control last night while going to bed average he woke up at approximate 1 AM with severe pain. He reports that his home Dilaudid and MS Contin did not help his pain. He presents for further management. He denies any recent fevers, chills, chest pain, shortness of breath, nausea, vomiting, constipation, diarrhea, dysuria. He says this feels "nothing like acute chest" and denies any infectious symptoms. He says the pain is a 910 severity and uncontrolled. He denies any other complaints on arrival.    The history is provided by the patient and medical records.  Sickle Cell Pain Crisis  Location:  L side, R side and lower extremity Severity:  Severe Onset quality:  Sudden Duration:  3 hours Similar to previous crisis episodes: yes   Timing:  Constant Progression:  Worsening Chronicity:  Recurrent History of pulmonary emboli: yes   Context: not infection   Relieved by:  Nothing Worsened by:  Nothing Ineffective treatments:  None tried Associated symptoms: no chest pain, no congestion, no cough, no fatigue, no fever, no headaches, no nausea, no shortness of breath, no swelling of legs, no vomiting and no wheezing   Risk factors: frequent pain crises and prior acute chest     Past Medical History:  Diagnosis Date  . Acute chest syndrome ([place])  06/18/2013  . Acute embolism and thrombosis of right internal jugular vein ([place])   . Alcohol consumption of one to four drinks per day   . Avascular necrosis (HCC)    Right Hip  . Blood transfusion   . Chronic anticoagulation   . Demand ischemia ([place]) 01/02/2014  . Former smoker   . Functional asplenia   . Hb-SS disease with crisis ([place])   . History of Clostridium difficile infection   . History of pulmonary embolus (PE)   . Hypertension   . Hypokalemia   . Leukocytosis    Chronic  . Mood disorder ([place])   . Noncompliance with medication regimen   . Oxygen deficiency   . Pulmonary hypertension   . Second hand tobacco smoke exposure   . Sickle cell anemia (HCC)   . Sickle-cell crisis with associated acute chest syndrome ([place]) 05/13/2013  . Stroke ([place])   . Thrombocytosis (HCC)    Chronic  . Uses marijuana     Patient Active Problem List   Diagnosis Date Noted  . Sickle cell anemia with pain ([place]) 04/05/2016  . Hb-SS disease with crisis ([place]) 03/27/2016  . Chest pain 03/13/2016  . H/O arterial ischemic stroke 11/11/2015  . Slurred speech 11/10/2015  . Chronic anemia   . Hb-SS disease without crisis ([place]) 10/07/2014  . Acute on chronic respiratory failure with hypoxia ([name])   . Abnormal ECG 09/10/2014  . Troponin level elevated 08/29/2014  . Cor pulmonale, chronic ([place]) 08/25/2014  . Sickle  cell anemia ([place]) 06/25/2014  . Anemia of chronic disease 06/25/2014  . PAH (pulmonary artery hypertension) 03/18/2014  . Chronic respiratory failure with hypoxia ([place]) 03/14/2014  . Paralytic strabismus, external ophthalmoplegia   . Chronic pain syndrome 12/12/2013  . Chronic anticoagulation 08/22/2013  . Essential hypertension 08/22/2013  . Pulmonary HTN 06/18/2013  . Functional asplenia   . Vitamin D deficiency 02/13/2013  . Rib pain 01/13/2013  . Hx of pulmonary embolus 06/29/2012  . 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 Avascular necrosis ([place])   . Sickle cell pain crisis  [facility])     Past Surgical History:  Procedure Laterality Date  . CHOLECYSTECTOMY     01/2008  . Excision of left periauricular cyst     10/2009  . Excision of right ear lobe cyst with primary closur     11/2007  . Porta cath placement    . Porta cath removal    . PORTACATH PLACEMENT  01/05/2012   Procedure: INSERTION PORT-A-CATH;  Surgeon: Odis Hollingshead, MD;  Location: [facility];  Service: General;  Laterality: N/A;  ultrasound guiced port a cath insertion with fluoroscopy  . Right hip replacement     08/2006  . UMBILICAL HERNIA REPAIR     01/2008       Home Medications    Prior to Admission medications   Medication Sig Start Date End Date Taking? Authorizing Provider  ambrisentan (LETAIRIS) 5 MG tablet Take 5 mg by mouth daily.    Yes Historical Provider, MD  aspirin 81 MG chewable tablet Chew 1 tablet (81 mg total) by mouth daily. 07/23/14  Yes [name], MD  carvedilol (COREG) 3.125 MG tablet Take 1 tablet (3.125 mg total) by mouth 2 (two) times daily with a meal. 03/17/16  Yes [name], MD  cholecalciferol (VITAMIN D) 1000 units tablet Take 2,000 Units by mouth daily.   Yes Historical Provider, MD  folic acid (FOLVITE) 1 MG tablet Take 1 mg by mouth daily.   Yes Historical Provider, MD  gabapentin (NEURONTIN) 300 MG capsule Take 1 capsule (300 mg total) by mouth 3 (three) times daily. 06/01/15  Yes Tresa Garter, MD  HYDROmorphone (DILAUDID) 4 MG tablet Take 1 tablet (4 mg total) by mouth every 4 (four) hours as needed for moderate pain or severe pain. 04/05/16 04/20/16 Yes Olugbemiga Essie Christine, MD  hydroxyurea (HYDREA) 500 MG capsule Take 2 capsules (1,000 mg total) by mouth daily. May take with food to minimize GI side effects. 03/17/16  Yes [name], MD  morphine (MS CONTIN) 30 MG 12 hr tablet Take 1 tablet (30 mg total) by mouth every 12 (twelve) hours. 04/05/16 05/05/16 Yes Tresa Garter, MD  rivaroxaban (XARELTO) 20 MG TABS tablet Take 1 tablet  (20 mg total) by mouth at bedtime. 12/29/15  Yes Tresa Garter, MD  zolpidem (AMBIEN) 10 MG tablet Take 1 tablet (10 mg total) by mouth at bedtime as needed for sleep. 04/05/16 05/05/16 Yes Tresa Garter, MD  Deferasirox (JADENU) 360 MG TABS Take 1,080 mg by mouth daily.     Historical Provider, MD    Family History Family History  Problem Relation Age of Onset  . Sickle cell trait Mother   . Depression Mother   . Diabetes Mother   . Sickle cell trait Father   . Sickle cell trait Brother     Social History Social History  Substance Use Topics  . Smoking status: Former Smoker    Packs/day:  0.50    Years: 10.00    Types: Cigarettes    Quit date: 05/29/2011  . Smokeless tobacco: Never Used  . Alcohol use No     Allergies   Patient has no known allergies.   Review of Systems Review of Systems  Constitutional: Negative for activity change, chills, diaphoresis, fatigue and fever.  HENT: Negative for congestion and rhinorrhea.   Eyes: Negative for visual disturbance.  Respiratory: Negative for cough, chest tightness, shortness of breath, wheezing and stridor.   Cardiovascular: Negative for chest pain, palpitations and leg swelling.  Gastrointestinal: Negative for abdominal distention, abdominal pain, blood in stool, constipation, diarrhea, nausea and vomiting.  Genitourinary: Negative for difficulty urinating, dysuria and flank pain.  Musculoskeletal: Negative for back pain and gait problem.  Skin: Negative for rash and wound.  Neurological: Negative for dizziness, weakness, light-headedness and headaches.  Psychiatric/Behavioral: Negative for agitation.  All other systems reviewed and are negative.    Physical Exam Updated Vital Signs BP 125/78 (BP Location: Left Arm)   Pulse 98   Temp 97.5 F (36.4 C) (Oral)   Resp 18   Ht 6' (1.829 m)   Wt 184 lb (83.5 kg)   SpO2 96%   BMI 24.95 kg/m   Physical Exam  Constitutional: He is oriented to person, place,  and time. He appears well-developed and well-nourished. No distress.  HENT:  Head: Normocephalic and atraumatic.  Right Ear: External ear normal.  Left Ear: External ear normal.  Nose: Nose normal.  Mouth/Throat: Oropharynx is clear and moist. No oropharyngeal exudate.  Eyes: EOM are normal. Pupils are equal, round, and reactive to light. Scleral icterus is present.  Neck: Normal range of motion. Neck supple.  Cardiovascular: Normal rate, normal heart sounds and intact distal pulses.   No murmur heard. Pulmonary/Chest: Effort normal and breath sounds normal. No stridor. No respiratory distress. He has no wheezes. He has no rales. He exhibits no tenderness.  Abdominal: Soft. There is no tenderness. There is no rebound and no guarding.  Musculoskeletal: He exhibits tenderness. He exhibits no edema.       Right ankle: Tenderness.       Left ankle: Tenderness.       Back:  Neurological: He is alert and oriented to person, place, and time. He displays normal reflexes. No cranial nerve deficit. He exhibits normal muscle tone. Coordination normal.  Skin: Skin is warm. Capillary refill takes less than 2 seconds. No rash noted. He is not diaphoretic. No erythema. No pallor.     ED Treatments / Results  Labs (all labs ordered are listed, but only abnormal results are displayed) Labs Reviewed  CBC WITH DIFFERENTIAL/PLATELET - Abnormal; Notable for the following:       Result Value   WBC 17.4 (*)    RBC 2.61 (*)    Hemoglobin 8.2 (*)    HCT 23.6 (*)    RDW 27.1 (*)    Platelets 657 (*)    nRBC 5 (*)    Neutro Abs 14.8 (*)    All other components within normal limits  RETICULOCYTES - Abnormal; Notable for the following:    Retic Ct Pct 19.1 (*)    RBC. 2.61 (*)    Retic Count, Manual 498.5 (*)    All other components within normal limits  BASIC METABOLIC PANEL - Abnormal; Notable for the following:    CO2 20 (*)    Glucose, Bld 107 (*)    All other components within normal limits  EKG  EKG Interpretation None       Radiology No results found.  Procedures Procedures (including critical care time)  Medications Ordered in ED Medications  dextrose 5 %-0.45 % sodium chloride infusion ( Intravenous Stopped 04/09/16 0433)  HYDROmorphone (DILAUDID) injection 2 mg (not administered)    Or  HYDROmorphone (DILAUDID) injection 2 mg (not administered)  ketorolac (TORADOL) 30 MG/ML injection 30 mg (30 mg Intravenous Given 04/09/16 0434)  HYDROmorphone (DILAUDID) injection 2 mg (2 mg Intravenous Given 04/09/16 0434)    Or  HYDROmorphone (DILAUDID) injection 2 mg ( Subcutaneous See Alternative 04/09/16 0434)  HYDROmorphone (DILAUDID) injection 2 mg (2 mg Intravenous Given 04/09/16 0506)    Or  HYDROmorphone (DILAUDID) injection 2 mg ( Subcutaneous See Alternative 04/09/16 0506)  HYDROmorphone (DILAUDID) injection 2 mg (2 mg Intravenous Given 04/09/16 0624)    Or  HYDROmorphone (DILAUDID) injection 2 mg ( Subcutaneous See Alternative 04/09/16 0624)  diphenhydrAMINE (BENADRYL) injection 25 mg (25 mg Intravenous Given 04/09/16 0434)  ondansetron (ZOFRAN) injection 4 mg (4 mg Intravenous Given 04/09/16 0510)  diphenhydrAMINE (BENADRYL) injection 25 mg (25 mg Intravenous Given 04/09/16 0638)  heparin lock flush 100 unit/mL (500 Units Intracatheter Given 04/09/16 0807)     Initial Impression / Assessment and Plan / ED Course  I have reviewed the triage vital signs and the nursing notes.  Pertinent labs & imaging results that were available during my care of the patient were reviewed by me and considered in my medical decision making (see chart for details).     [name] is a 36 y.o. male with a past medical history significant for sickle cell disease, hypertension, stroke, pulmonary embolism, and frequent pain crises who presents with sickle cell pain.  History and exam are seen above.   On exam, patient has clear lungs and chest is nontender. Patient has  tenderness on his bilateral flanks. He also is tenderness in her bilateral ankles. No change in sensation, strength, or lower extremity pulses. Abdomen nontender. No focal neurologic deficits.  Given patient's reported this pain is "the same as his normal pain crisis", and his denial that this feels like acute chest, and his lack of infectious symptoms, do not feel patient has acute chest syndrome. Patient will have screening laboratory testing performed as well as be given pain medicine per the sickle cell pain protocol.  Patient will be given fluids, pain medicine, Benadryl for itching, and reassessed.  Patient thinks that IV medications should turn this around to let him go home today.   Patient felt much better after several rounds of pain medications. Laboratory testing is similar to prior. Patient reports that he does require oxygen intermittently at home.  patient denies any shortness of breath. Patient was hypoxic for a period of time reports that he will start his oxygen when he gets home. Patient says that it is normal for him.  Patient will call and follow up with his sickle-cell team for further management. Patient felt much better and wants to go home. Patient had normal ambulation and gate without worsened pain or shortness of breath. Patient felt stable for discharge. Patient discharged in good condition.     Final Clinical Impressions(s) / ED Diagnoses   Final diagnoses:  Sickle cell pain crisis [facility])    New Prescriptions Discharge Medication List as of 04/09/2016  7:25 AM     Clinical Impression: 1. Sickle cell pain crisis ([place])     Disposition: Discharge  Condition: Good  I have  discussed the results, Dx and Tx plan with the pt(& family if present). He/she/they expressed understanding and agree(s) with the plan. Discharge instructions discussed at great length. Strict return precautions discussed and pt &/or family have verbalized understanding of the instructions. No  further questions at time of discharge.    Discharge Medication List as of 04/09/2016  7:25 AM      Follow Up: Tresa Garter, MD [place] [id number] [phone]  Schedule an appointment as soon as possible for a visit    [facility] DEPT [place] [id number] [place] [phone]  If symptoms worsen     Courtney Paris, MD 04/09/16 [phone]

## 2016-04-09 NOTE — Discharge Instructions (Signed)
Please take your pain medicine at home to help manage her symptoms. Please call your sickle cell team in the morning for further management instructions. If any symptoms acutely worsen or you begin having any symptoms similar to her prior acute chest syndrome, please return to the nearest emergency department.

## 2016-04-09 NOTE — ED Triage Notes (Signed)
Pt reports bilateral rib pain from Sickle cell crisis. Pt states pain began yesterday. Pt was seen in ED on 04/09/16

## 2016-04-10 DIAGNOSIS — Z7901 Long term (current) use of anticoagulants: Secondary | ICD-10-CM | POA: Diagnosis not present

## 2016-04-10 DIAGNOSIS — J9621 Acute and chronic respiratory failure with hypoxia: Secondary | ICD-10-CM | POA: Diagnosis not present

## 2016-04-10 DIAGNOSIS — J9611 Chronic respiratory failure with hypoxia: Secondary | ICD-10-CM

## 2016-04-10 DIAGNOSIS — I272 Pulmonary hypertension, unspecified: Secondary | ICD-10-CM | POA: Diagnosis not present

## 2016-04-10 DIAGNOSIS — Z8673 Personal history of transient ischemic attack (TIA), and cerebral infarction without residual deficits: Secondary | ICD-10-CM | POA: Diagnosis not present

## 2016-04-10 DIAGNOSIS — Z7982 Long term (current) use of aspirin: Secondary | ICD-10-CM | POA: Diagnosis not present

## 2016-04-10 DIAGNOSIS — I2781 Cor pulmonale (chronic): Secondary | ICD-10-CM | POA: Diagnosis not present

## 2016-04-10 DIAGNOSIS — Z9119 Patient's noncompliance with other medical treatment and regimen: Secondary | ICD-10-CM | POA: Diagnosis not present

## 2016-04-10 DIAGNOSIS — R9431 Abnormal electrocardiogram [ECG] [EKG]: Secondary | ICD-10-CM | POA: Diagnosis not present

## 2016-04-10 DIAGNOSIS — Z86718 Personal history of other venous thrombosis and embolism: Secondary | ICD-10-CM | POA: Diagnosis not present

## 2016-04-10 DIAGNOSIS — R701 Abnormal plasma viscosity: Secondary | ICD-10-CM | POA: Diagnosis not present

## 2016-04-10 DIAGNOSIS — I10 Essential (primary) hypertension: Secondary | ICD-10-CM | POA: Diagnosis not present

## 2016-04-10 DIAGNOSIS — R0602 Shortness of breath: Secondary | ICD-10-CM | POA: Diagnosis not present

## 2016-04-10 DIAGNOSIS — J810 Acute pulmonary edema: Secondary | ICD-10-CM | POA: Diagnosis not present

## 2016-04-10 DIAGNOSIS — D5700 Hb-SS disease with crisis, unspecified: Secondary | ICD-10-CM | POA: Diagnosis not present

## 2016-04-10 DIAGNOSIS — R079 Chest pain, unspecified: Secondary | ICD-10-CM | POA: Diagnosis not present

## 2016-04-10 DIAGNOSIS — I2721 Secondary pulmonary arterial hypertension: Secondary | ICD-10-CM | POA: Diagnosis present

## 2016-04-10 DIAGNOSIS — G894 Chronic pain syndrome: Secondary | ICD-10-CM | POA: Diagnosis not present

## 2016-04-10 DIAGNOSIS — Z96641 Presence of right artificial hip joint: Secondary | ICD-10-CM | POA: Diagnosis present

## 2016-04-10 DIAGNOSIS — Z86711 Personal history of pulmonary embolism: Secondary | ICD-10-CM | POA: Diagnosis not present

## 2016-04-10 DIAGNOSIS — R52 Pain, unspecified: Secondary | ICD-10-CM | POA: Diagnosis not present

## 2016-04-10 DIAGNOSIS — Z79891 Long term (current) use of opiate analgesic: Secondary | ICD-10-CM | POA: Diagnosis not present

## 2016-04-10 DIAGNOSIS — D638 Anemia in other chronic diseases classified elsewhere: Secondary | ICD-10-CM | POA: Diagnosis not present

## 2016-04-10 DIAGNOSIS — D72829 Elevated white blood cell count, unspecified: Secondary | ICD-10-CM | POA: Diagnosis not present

## 2016-04-10 DIAGNOSIS — Z79899 Other long term (current) drug therapy: Secondary | ICD-10-CM | POA: Diagnosis not present

## 2016-04-10 DIAGNOSIS — Z87891 Personal history of nicotine dependence: Secondary | ICD-10-CM | POA: Diagnosis not present

## 2016-04-10 DIAGNOSIS — R748 Abnormal levels of other serum enzymes: Secondary | ICD-10-CM | POA: Diagnosis not present

## 2016-04-10 LAB — MRSA PCR SCREENING: MRSA by PCR: NEGATIVE

## 2016-04-10 LAB — CBC WITH DIFFERENTIAL/PLATELET
BASOS ABS: 0 10*3/uL (ref 0.0–0.1)
Basophils Relative: 0 %
EOS PCT: 2 %
Eosinophils Absolute: 0.5 10*3/uL (ref 0.0–0.7)
HEMATOCRIT: 22.2 % — AB (ref 39.0–52.0)
Hemoglobin: 8 g/dL — ABNORMAL LOW (ref 13.0–17.0)
LYMPHS ABS: 2.4 10*3/uL (ref 0.7–4.0)
LYMPHS PCT: 10 %
MCH: 31.4 pg (ref 26.0–34.0)
MCHC: 36 g/dL (ref 30.0–36.0)
MCV: 87.1 fL (ref 78.0–100.0)
MONOS PCT: 12 %
Monocytes Absolute: 2.8 10*3/uL — ABNORMAL HIGH (ref 0.1–1.0)
NEUTROS ABS: 17.8 10*3/uL — AB (ref 1.7–7.7)
Neutrophils Relative %: 76 %
Platelets: 633 10*3/uL — ABNORMAL HIGH (ref 150–400)
RBC: 2.55 MIL/uL — ABNORMAL LOW (ref 4.22–5.81)
RDW: 24.7 % — ABNORMAL HIGH (ref 11.5–15.5)
WBC: 23.5 10*3/uL — ABNORMAL HIGH (ref 4.0–10.5)

## 2016-04-10 LAB — COMPREHENSIVE METABOLIC PANEL
ALBUMIN: 4.2 g/dL (ref 3.5–5.0)
ALT: 20 U/L (ref 17–63)
ANION GAP: 4 — AB (ref 5–15)
AST: 35 U/L (ref 15–41)
Alkaline Phosphatase: 96 U/L (ref 38–126)
BUN: 10 mg/dL (ref 6–20)
CO2: 22 mmol/L (ref 22–32)
Calcium: 8.9 mg/dL (ref 8.9–10.3)
Chloride: 109 mmol/L (ref 101–111)
Creatinine, Ser: 0.92 mg/dL (ref 0.61–1.24)
GFR calc Af Amer: >60 mL/min (ref >60)
GFR calc non Af Amer: >60 mL/min (ref >60)
GLUCOSE: 109 mg/dL — AB (ref 65–99)
POTASSIUM: 3.6 mmol/L (ref 3.5–5.1)
SODIUM: 135 mmol/L (ref 135–145)
TOTAL PROTEIN: 7.7 g/dL (ref 6.5–8.1)
Total Bilirubin: 4.8 mg/dL — ABNORMAL HIGH (ref 0.3–1.2)

## 2016-04-10 LAB — RETICULOCYTES
RBC.: 2.55 MIL/uL — AB (ref 4.22–5.81)
Retic Count, Absolute: 533 10*3/uL — ABNORMAL HIGH (ref 19.0–186.0)
Retic Ct Pct: 20.9 % — ABNORMAL HIGH (ref 0.4–3.1)

## 2016-04-10 LAB — MAGNESIUM: MAGNESIUM: 1.8 mg/dL (ref 1.7–2.4)

## 2016-04-10 LAB — TROPONIN I
Troponin I: 0.9 ng/mL (ref <0.03)
Troponin I: 1.23 ng/mL (ref <0.03)

## 2016-04-10 IMAGING — CT CT ANGIO CHEST
2 of 6 series · 19 of 36 positions shown · IV contrast (OMNIPAQUE)
Comparison: Chest x-ray 05/14/2013.  CT chest 03/26/2013.

CLINICAL DATA: Sickle-cell crisis with chest pain. Evaluate for
pulmonary embolus.

EXAM:
CT ANGIOGRAPHY CHEST WITH CONTRAST
TECHNIQUE: Multidetector CT imaging of the chest was performed using the
standard protocol during bolus administration of intravenous
contrast. Multiplanar CT image reconstructions and MIPs were
obtained to evaluate the vascular anatomy.
CONTRAST:  100mL OMNIPAQUE IOHEXOL 350 MG/ML SOLN

[Series 6: pe thins @ 1mm · axial · 0.71mm/px · z∈[+1684,+1922]mm · 18 of 266 slices shown]
[im 14/266  lung]
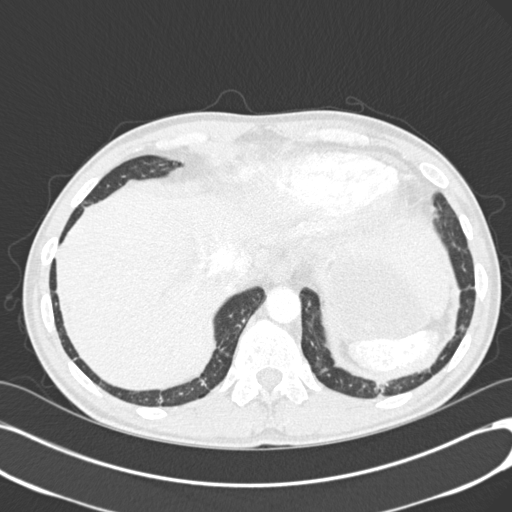
[im 27/266  mediastinal]
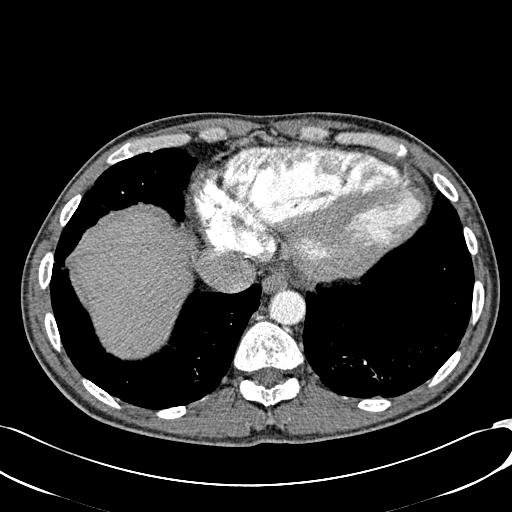
[im 40/266  lung]
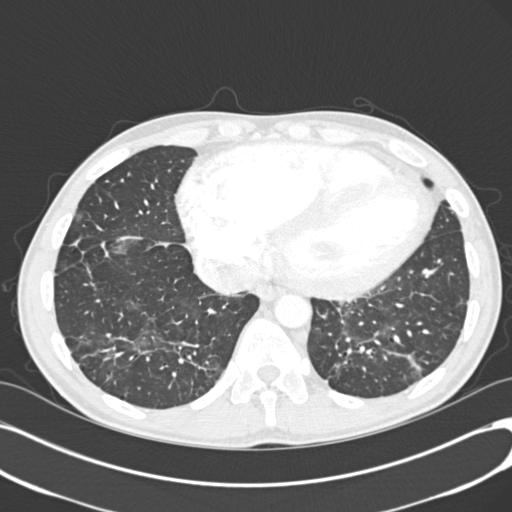
[im 54/266  mediastinal]
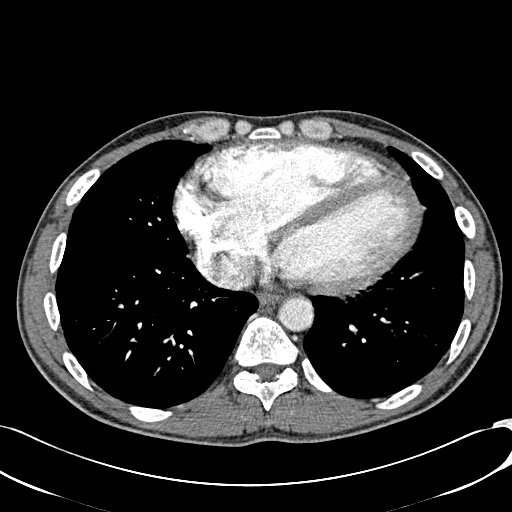
[im 67/266  lung]
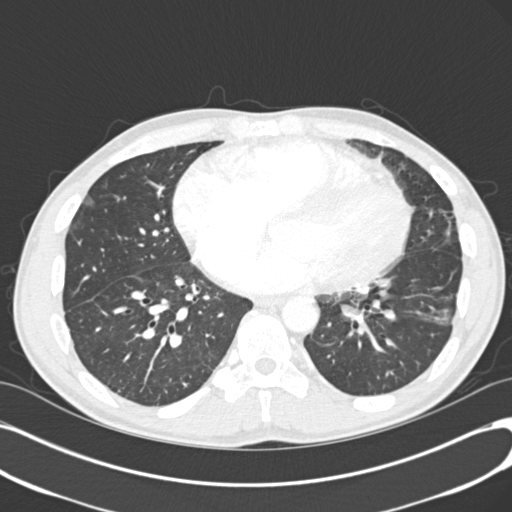
[im 80/266  mediastinal]
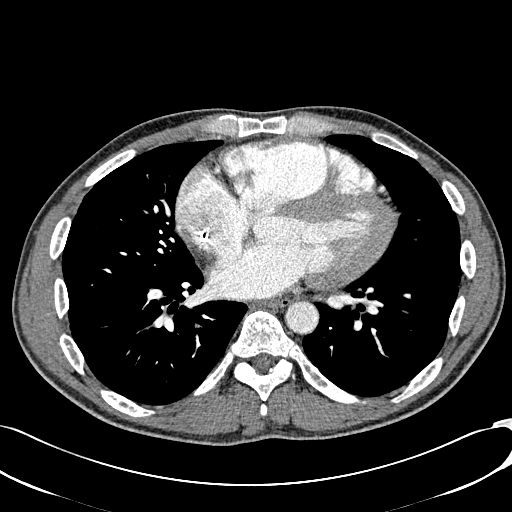
[im 93/266  lung]
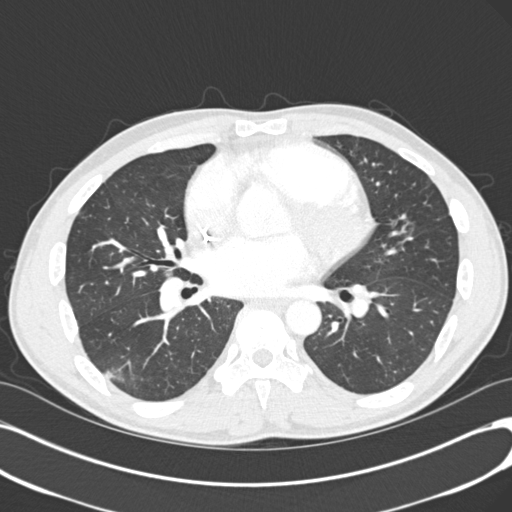
[im 107/266  mediastinal]
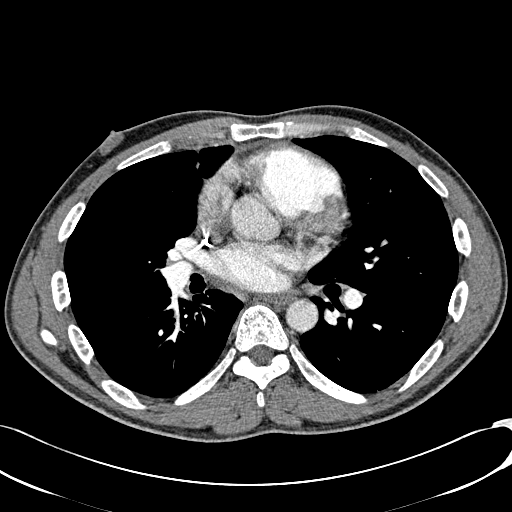
[im 120/266  lung]
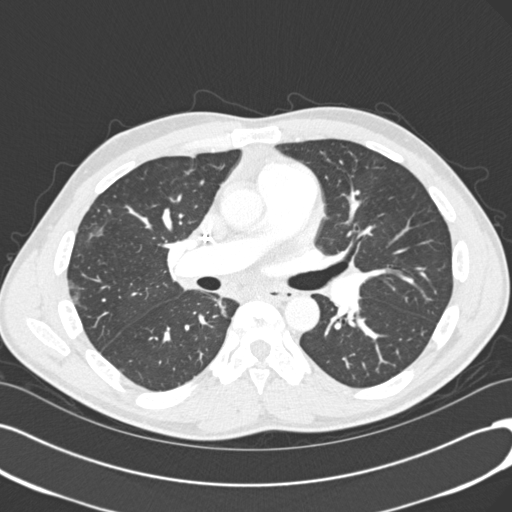
[im 146/266  mediastinal]
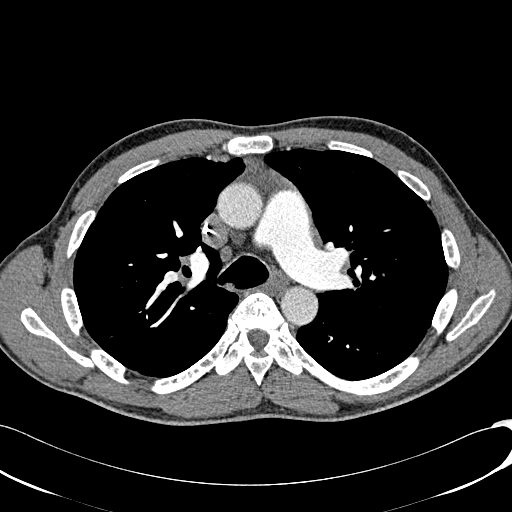
[im 160/266  lung]
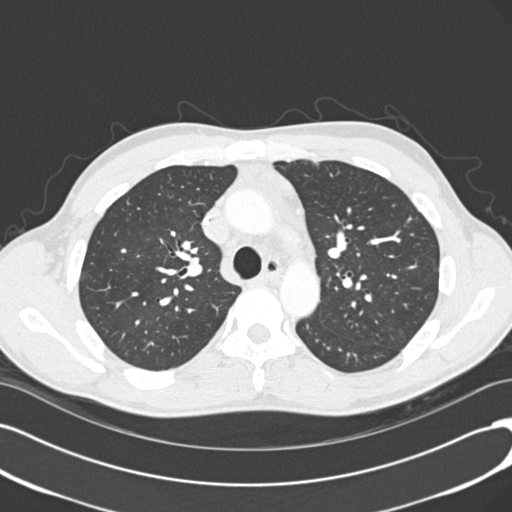
[im 173/266  mediastinal]
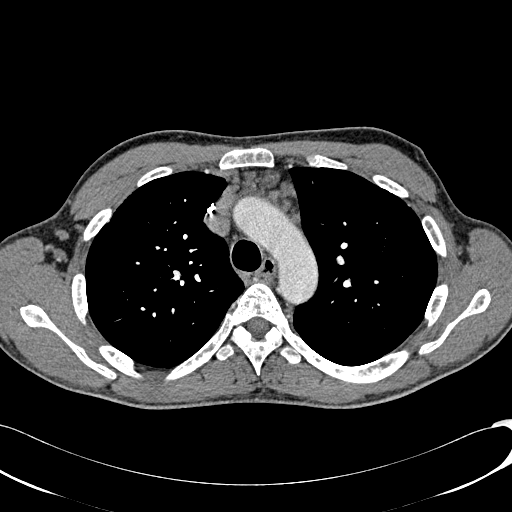
[im 186/266  lung]
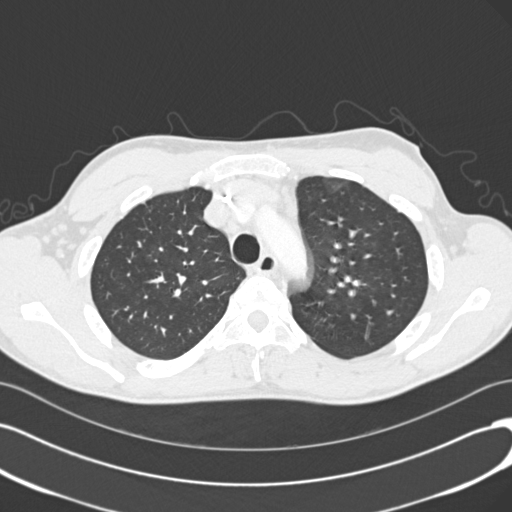
[im 199/266  mediastinal]
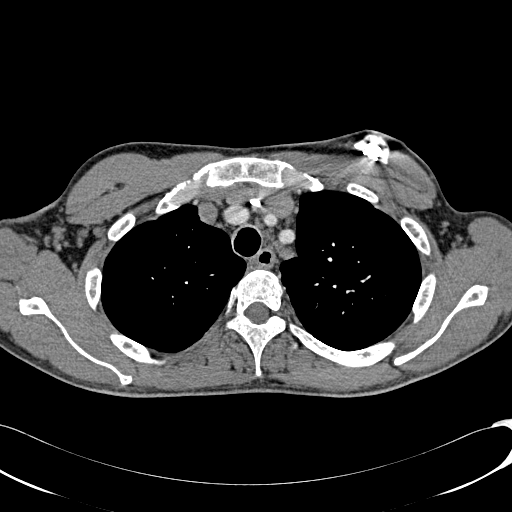
[im 213/266  lung]
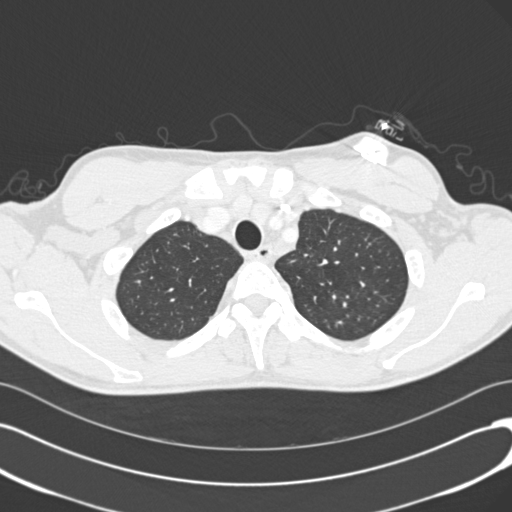
[im 226/266  mediastinal]
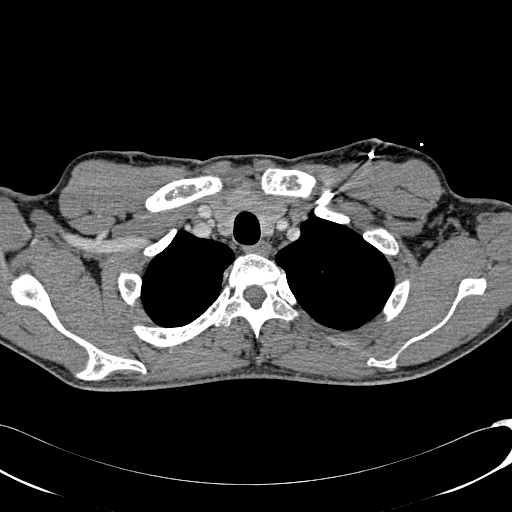
[im 239/266  lung]
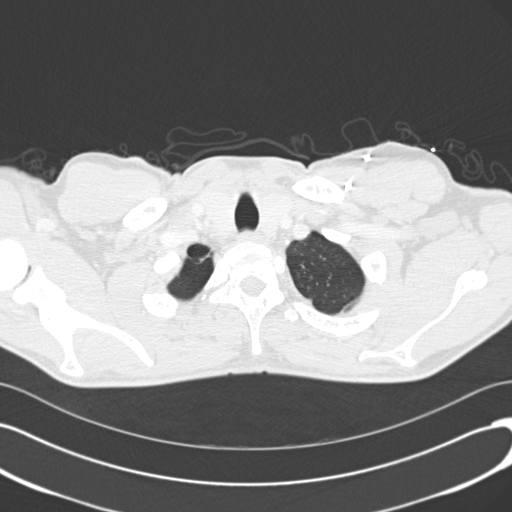
[im 252/266  mediastinal]
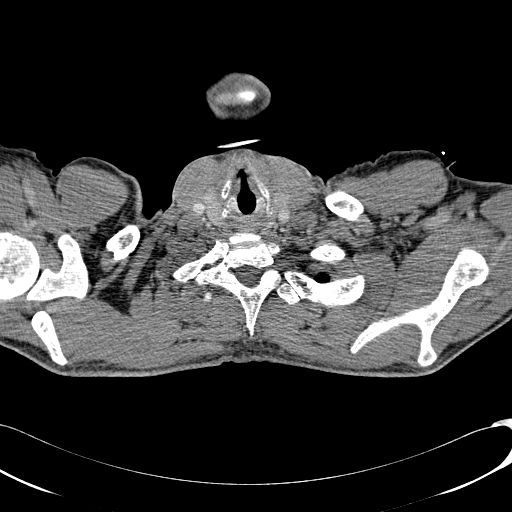

[Series 602: coronal mpr · coronal · 0.71mm/px · 1 of 108 slices shown]
[im 54/108  mediastinal]
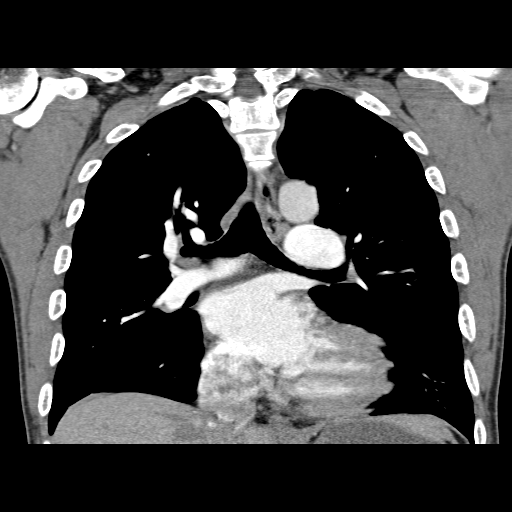

[19 of 36 positions shown; findings below may reference images not displayed]

FINDINGS: Mediastinum: No filling defects are noted within the pulmonary
arterial tree to suggest pulmonary emboli. Heart size is increased.
No pericardial fluid, thickening or calcification. No acute
abnormality of the thoracic aorta or other great vessels of the
mediastinum. Mild right hilar and mediastinal lymph node prominence,
stable. The esophagus is normal in appearance. Layering mucus within
the proximal right mainstem bronchus. Stable Port-A-Cath.

Lungs/Pleura: No pleural effusions. No suspicious appearing
pulmonary nodules or masses. Peripheral areas of ground-glass
opacity in both lungs, likely representing a acute chest syndrome.
Small vessel infarction with hemorrhagic edema not excluded. No
definite bronchopneumonia.

Upper Abdomen: Visualized portions of the upper abdomen are
unremarkable. Dense calcification of the spleen consistent with auto
infarction is redemonstrated.

Musculoskeletal: No aggressive appearing lytic or blastic lesions
are noted in the visualized portions of the skeleton. Sickle cell
endplate infarctions.

Review of the MIP images confirms the above findings.
IMPRESSION: No pulmonary embolus. Stable peripheral ground-glass opacities,
without definite bronchopneumonia.

## 2016-04-10 MED ORDER — SENNOSIDES-DOCUSATE SODIUM 8.6-50 MG PO TABS
1.0000 | ORAL_TABLET | Freq: Two times a day (BID) | ORAL | Status: DC
  Administered 2016-04-10 – 2016-04-26 (×32): 1 via ORAL
  Filled 2016-04-10 (×33): qty 1

## 2016-04-10 MED ORDER — CARVEDILOL 3.125 MG PO TABS
3.1250 mg | ORAL_TABLET | Freq: Two times a day (BID) | ORAL | Status: DC
  Administered 2016-04-10 – 2016-04-26 (×34): 3.125 mg via ORAL
  Filled 2016-04-10 (×35): qty 1

## 2016-04-10 MED ORDER — ONDANSETRON HCL 4 MG/2ML IJ SOLN: 4.0000 mg | Freq: Four times a day (QID) | INTRAMUSCULAR | Status: DC | PRN

## 2016-04-10 MED ORDER — SODIUM CHLORIDE 0.9% FLUSH: 9.0000 mL | INTRAVENOUS | Status: DC | PRN

## 2016-04-10 MED ORDER — HYDROMORPHONE HCL 2 MG PO TABS
4.0000 mg | ORAL_TABLET | ORAL | Status: DC
  Administered 2016-04-10 – 2016-04-12 (×12): 4 mg via ORAL
  Filled 2016-04-10 (×12): qty 2

## 2016-04-10 MED ORDER — DEXTROSE-NACL 5-0.45 % IV SOLN
INTRAVENOUS | Status: DC
  Administered 2016-04-10 – 2016-04-11 (×2): via INTRAVENOUS

## 2016-04-10 MED ORDER — RIVAROXABAN 20 MG PO TABS
20.0000 mg | ORAL_TABLET | Freq: Every day | ORAL | Status: DC
  Administered 2016-04-10 – 2016-04-11 (×2): 20 mg via ORAL
  Filled 2016-04-10 (×2): qty 1

## 2016-04-10 MED ORDER — HYDROXYUREA 500 MG PO CAPS
1000.0000 mg | ORAL_CAPSULE | Freq: Every day | ORAL | Status: DC
  Administered 2016-04-10 – 2016-04-15 (×6): 1000 mg via ORAL
  Filled 2016-04-10 (×8): qty 2

## 2016-04-10 MED ORDER — HYDROMORPHONE HCL 1 MG/ML IJ SOLN
1.0000 mg | Freq: Once | INTRAMUSCULAR | Status: AC
  Administered 2016-04-10: 1 mg via INTRAVENOUS

## 2016-04-10 MED ORDER — ZOLPIDEM TARTRATE 10 MG PO TABS
10.0000 mg | ORAL_TABLET | Freq: Every evening | ORAL | Status: DC | PRN
  Administered 2016-04-10 – 2016-04-25 (×16): 10 mg via ORAL
  Filled 2016-04-10 (×16): qty 1

## 2016-04-10 MED ORDER — FOLIC ACID 1 MG PO TABS
1.0000 mg | ORAL_TABLET | Freq: Every day | ORAL | Status: DC
  Administered 2016-04-10 – 2016-04-21 (×12): 1 mg via ORAL
  Filled 2016-04-10 (×12): qty 1

## 2016-04-10 MED ORDER — GABAPENTIN 300 MG PO CAPS
300.0000 mg | ORAL_CAPSULE | Freq: Three times a day (TID) | ORAL | Status: DC
  Administered 2016-04-10 – 2016-04-26 (×50): 300 mg via ORAL
  Filled 2016-04-10 (×50): qty 1

## 2016-04-10 MED ORDER — DIPHENHYDRAMINE HCL 50 MG/ML IJ SOLN: 12.5000 mg | Freq: Four times a day (QID) | INTRAMUSCULAR | Status: DC | PRN

## 2016-04-10 MED ORDER — POLYETHYLENE GLYCOL 3350 17 G PO PACK
17.0000 g | PACK | Freq: Every day | ORAL | Status: DC | PRN
  Administered 2016-04-13: 17 g via ORAL
  Filled 2016-04-10 (×2): qty 1

## 2016-04-10 MED ORDER — DIPHENHYDRAMINE HCL 50 MG/ML IJ SOLN
25.0000 mg | Freq: Once | INTRAMUSCULAR | Status: AC
  Administered 2016-04-10: 25 mg via INTRAVENOUS
  Filled 2016-04-10: qty 1

## 2016-04-10 MED ORDER — VITAMIN D3 25 MCG (1000 UNIT) PO TABS
2000.0000 [IU] | ORAL_TABLET | Freq: Every day | ORAL | Status: DC
  Administered 2016-04-10 – 2016-04-26 (×17): 2000 [IU] via ORAL
  Filled 2016-04-10 (×18): qty 2

## 2016-04-10 MED ORDER — NALOXONE HCL 0.4 MG/ML IJ SOLN: 0.4000 mg | INTRAMUSCULAR | Status: DC | PRN

## 2016-04-10 MED ORDER — KETOROLAC TROMETHAMINE 30 MG/ML IJ SOLN
30.0000 mg | Freq: Four times a day (QID) | INTRAMUSCULAR | Status: AC
  Administered 2016-04-10 – 2016-04-15 (×20): 30 mg via INTRAVENOUS
  Filled 2016-04-10 (×20): qty 1

## 2016-04-10 MED ORDER — DEFERASIROX 360 MG PO TABS: 1080.0000 mg | ORAL_TABLET | Freq: Every day | ORAL | Status: DC

## 2016-04-10 MED ORDER — DIPHENHYDRAMINE HCL 12.5 MG/5ML PO ELIX: 12.5000 mg | ORAL_SOLUTION | Freq: Four times a day (QID) | ORAL | Status: DC | PRN

## 2016-04-10 MED ORDER — ASPIRIN 81 MG PO CHEW
81.0000 mg | CHEWABLE_TABLET | Freq: Every day | ORAL | Status: DC
  Administered 2016-04-10 – 2016-04-26 (×17): 81 mg via ORAL
  Filled 2016-04-10 (×17): qty 1

## 2016-04-10 MED ORDER — SODIUM CHLORIDE 0.9 % IV SOLN
25.0000 mg | Freq: Four times a day (QID) | INTRAVENOUS | Status: DC | PRN
  Administered 2016-04-17 – 2016-04-25 (×15): 25 mg via INTRAVENOUS
  Filled 2016-04-10 (×31): qty 0.5

## 2016-04-10 MED ORDER — DIPHENHYDRAMINE HCL 25 MG PO CAPS: 25.0000 mg | ORAL_CAPSULE | ORAL | Status: DC | PRN

## 2016-04-10 MED ORDER — AMBRISENTAN 5 MG PO TABS
5.0000 mg | ORAL_TABLET | Freq: Every day | ORAL | Status: DC
  Administered 2016-04-11 – 2016-04-20 (×10): 5 mg via ORAL

## 2016-04-10 MED ORDER — SODIUM CHLORIDE 0.9% FLUSH
10.0000 mL | INTRAVENOUS | Status: DC | PRN
  Administered 2016-04-22 – 2016-04-26 (×2): 10 mL
  Filled 2016-04-10 (×2): qty 40

## 2016-04-10 MED ORDER — SODIUM CHLORIDE 0.9 % IV SOLN
25.0000 mg | INTRAVENOUS | Status: DC | PRN
  Filled 2016-04-10: qty 0.5

## 2016-04-10 MED ORDER — HYDROMORPHONE 1 MG/ML IV SOLN
INTRAVENOUS | Status: DC
  Administered 2016-04-10: 5.58 mg via INTRAVENOUS
  Administered 2016-04-10: 9.8 mg via INTRAVENOUS
  Administered 2016-04-10: 17:00:00 via INTRAVENOUS
  Administered 2016-04-11: 6.4 mg via INTRAVENOUS
  Administered 2016-04-11: 10:00:00 via INTRAVENOUS
  Administered 2016-04-11: 8 mg via INTRAVENOUS
  Administered 2016-04-11: 5.6 mg via INTRAVENOUS
  Filled 2016-04-10 (×2): qty 25

## 2016-04-10 MED ORDER — HYDROMORPHONE 1 MG/ML IV SOLN
INTRAVENOUS | Status: DC
  Administered 2016-04-10: 7.2 mg via INTRAVENOUS
  Administered 2016-04-10: 1 mg via INTRAVENOUS
  Filled 2016-04-10: qty 25

## 2016-04-10 MED ORDER — MORPHINE SULFATE ER 30 MG PO TBCR
30.0000 mg | EXTENDED_RELEASE_TABLET | Freq: Two times a day (BID) | ORAL | Status: DC
  Administered 2016-04-10 – 2016-04-26 (×33): 30 mg via ORAL
  Filled 2016-04-10 (×33): qty 1

## 2016-04-10 MED ORDER — PROCHLORPERAZINE MALEATE 10 MG PO TABS: 10.0000 mg | ORAL_TABLET | Freq: Four times a day (QID) | ORAL | Status: DC | PRN

## 2016-04-10 MED ORDER — ORAL CARE MOUTH RINSE
15.0000 mL | Freq: Two times a day (BID) | OROMUCOSAL | Status: DC
  Administered 2016-04-10 – 2016-04-25 (×28): 15 mL via OROMUCOSAL

## 2016-04-10 MED ORDER — DIPHENHYDRAMINE HCL 25 MG PO CAPS
25.0000 mg | ORAL_CAPSULE | Freq: Four times a day (QID) | ORAL | Status: DC | PRN
Start: 2016-04-10 — End: 2016-04-25
  Administered 2016-04-11 – 2016-04-15 (×5): 25 mg via ORAL
  Administered 2016-04-15: 50 mg via ORAL
  Administered 2016-04-16: 25 mg via ORAL
  Administered 2016-04-22: 50 mg via ORAL
  Filled 2016-04-10 (×2): qty 2
  Filled 2016-04-10: qty 1
  Filled 2016-04-10: qty 2
  Filled 2016-04-10 (×3): qty 1
  Filled 2016-04-10: qty 2
  Filled 2016-04-10: qty 1
  Filled 2016-04-10: qty 2
  Filled 2016-04-10: qty 1

## 2016-04-10 NOTE — Progress Notes (Signed)
Pt had a 3, 5, 6, 9 beat run of VTach, O2 sats at 93% on non-rebreather. No diet ordered. MD notified.

## 2016-04-10 NOTE — Progress Notes (Signed)
Attempted to take patient off of the non-rebreather. Patient O2 sats were 98-100% on nonrebreather. Placed patient on 5L O2 and sats dropped to 88%. After a couple of minutes, O2 level dropped to 81% and O2 was turned up to 6L. O2 sats could never reach goal of 87% on 6L and patient was replaced back on the non-rebreather. Will continue to assess. Continous pulse ox on.

## 2016-04-10 NOTE — Progress Notes (Signed)
Patient's O2 on nonbreather is sustaining at 91%. Attempted to drop O2 device down to a Venti 55% mask. O2 sats dropped down to 77% and sustained. Patient denies SOB and cardiac symptoms. Patient placed back on nonrebreather and O2 levels sustaining in the 89-91%. Will continue to monitor and wean as possible.

## 2016-04-10 NOTE — Progress Notes (Signed)
At start of this shift  Went into pt room to find the 02 not on and pt sats were 75 room air, while he was in and out of eating. Pt drowsy but arousable.. nonrebreather mask placed on and 02 sats  Went up to 84%. Concerns with  02 issues addressed with AC. Apparently attempts were made  To wean from nonrebreather to venti and nasal cannula but sats were not maintaining at appropriate level as stated by MD Previous RN made several attempts and notated account. Request made for transfer and order received by attending to transfer to 1227. Will continue to monitor and intervene accordingly

## 2016-04-10 NOTE — Progress Notes (Signed)
RN entered room and found that patient did not have his O2 on. O2 sats were 74% on RA. Nonrebreather placed back on patient and O2 sats came up to 89-91%.

## 2016-04-10 NOTE — Progress Notes (Addendum)
Patient ID: SHO SALGUERO, male   DOB: 14-Oct-1980, 36 y.o.   MRN: [id number] Pt admitted this morning with hypoxia and sickle cell crisis. Pt's pain started on Thursday (4 days ago) and predictably, he was without his Oxygen for more than 6 hours on Wednesday (the day before his crisis started). He states that he has no somnolence with the current dose on the PCA.  He also has a significant hypoxemia with Oxygen much higher than usual. He states that he has missed only 1 dose of his Xarelto which occurred in the last week.   Continue PCA and MS Contin. Schedule oral Dilaudid and decrease IVF to 75 ml/hr. Continue Hydrea. Check BNP in the morning and check daily weights. Re-evaluate pain control tomorrow and make adjustments to medications.   Pt also has minimally elevated Troponin. He has no pre-cordial chest pain and frequently presents in this manner when experiencing hypoxic episodes. This elevation in tropoin likely reflects sub-endocardial damage due to demand ischemia. EKG reviewed. Will continue to follow Troponin enzymes.  Pt also has a prolonged QTc on EKG so will discontinue Zofran and substitute compazine.   Derreon Consalvo A.

## 2016-04-10 NOTE — Progress Notes (Addendum)
MD aware of patient being on a nonrebreather since admit to the department this am. Attempted to wean patient to the Venti mask. Patient O2 sats dropped to 79% on 50% Venti mask. Increased venti to 55% and O2 sats increased to 81%. Patient placed back  On nonrebreather and O2 sats are sustaining in the 90-91% range. Will continue to  Monitor and attempt to wean as patient tolerates. Continuous pulse ox remains in place. O2 sats were verified with Dynamap machine O2 monitor.

## 2016-04-10 NOTE — H&P (Signed)
History and Physical    [name] PNT:[id number] DOB: 21-Oct-1980 DOA: 04/09/2016  Referring MD/NP/PA:   PCP: [name], MD   Patient coming from:  The patient is coming from home.  At baseline, pt is independent for most of ADL.   Chief Complaint: bilateral rib cages.  HPI: [name] is a 36 y.o. male with medical history significant of sickle cell disease, stroke with aphasia, acute chest syndrome, DVT, PE on Xarelto, pulmonary hypertension, cor pulmonale, chronic respiratory failure, C diff colitis, who presents with bilateral rib cages.  Patient states that his bilateral rib cage pain started yesterday, which has been progressively getting worse. His home pain medication does not help anymore. Patient does not have SOB, cough, fever or chills. He also has mild chest pain, which is located in the frontal chest, constant, dull, nonradiating. Patient denies nausea, vomiting, diarrhea, abdominal pain, symptoms of UTI. No leg edema. Patient states that he has been compliant to taking his Xarelto.  ED Course: pt was found to have WBC 23.5, stable hemoglobin 8.0, electrolytes renal function okay, negative chest x-ray, temperature normal, oxygen saturation 80-87% on room air, tachycardia, tachypnea. Patient is admitted to telemetry bed as inpatient.  Review of Systems:   General: no fevers, chills, no changes in body weight, has poor appetite, has fatigue HEENT: no blurry vision, hearing changes or sore throat Respiratory: no dyspnea, coughing, wheezing CV: has chest pain, no palpitations GI: no nausea, vomiting, abdominal pain, diarrhea, constipation GU: no dysuria, burning on urination, increased urinary frequency, hematuria  Ext: no leg edema Neuro: no unilateral weakness, numbness, or tingling, no vision change or hearing loss Skin: no rash, no skin tear. MSK: Has bilateral rib cage pain. Heme: No easy bruising.  Travel history: No recent long distant  travel.  Allergy: No Known Allergies  Past Medical History:  Diagnosis Date  . Acute chest syndrome ([place]) 06/18/2013  . Acute embolism and thrombosis of right internal jugular vein ([place])   . Alcohol consumption of one to four drinks per day   . Avascular necrosis (HCC)    Right Hip  . Blood transfusion   . Chronic anticoagulation   . Demand ischemia ([place]) 01/02/2014  . Former smoker   . Functional asplenia   . Hb-SS disease with crisis ([place])   . History of Clostridium difficile infection   . History of pulmonary embolus (PE)   . Hypertension   . Hypokalemia   . Leukocytosis    Chronic  . Mood disorder ([place])   . Noncompliance with medication regimen   . Oxygen deficiency   . Pulmonary hypertension   . Second hand tobacco smoke exposure   . Sickle cell anemia (HCC)   . Sickle-cell crisis with associated acute chest syndrome ([place]) 05/13/2013  . Stroke ([place])   . Thrombocytosis (HCC)    Chronic  . Uses marijuana     Past Surgical History:  Procedure Laterality Date  . CHOLECYSTECTOMY     01/2008  . Excision of left periauricular cyst     10/2009  . Excision of right ear lobe cyst with primary closur     11/2007  . Porta cath placement    . Porta cath removal    . PORTACATH PLACEMENT  01/05/2012   Procedure: INSERTION PORT-A-CATH;  Surgeon: Odis Hollingshead, MD;  Location: [facility];  Service: General;  Laterality: N/A;  ultrasound guiced port a cath insertion with fluoroscopy  . Right hip replacement  08/2006  . UMBILICAL HERNIA REPAIR     01/2008    Social History:  reports that he quit smoking about 4 years ago. His smoking use included Cigarettes. He has a 5.00 pack-year smoking history. He has never used smokeless tobacco. He reports that he does not drink alcohol or use drugs.  Family History:  Family History  Problem Relation Age of Onset  . Sickle cell trait Mother   . Depression Mother   . Diabetes Mother   . Sickle cell trait Father   . Sickle cell trait  Brother      Prior to Admission medications   Medication Sig Start Date End Date Taking? Authorizing Provider  ambrisentan (LETAIRIS) 5 MG tablet Take 5 mg by mouth daily.     Historical Provider, MD  aspirin 81 MG chewable tablet Chew 1 tablet (81 mg total) by mouth daily. 07/23/14   [name], MD  carvedilol (COREG) 3.125 MG tablet Take 1 tablet (3.125 mg total) by mouth 2 (two) times daily with a meal. 03/17/16   [name], MD  cholecalciferol (VITAMIN D) 1000 units tablet Take 2,000 Units by mouth daily.    Historical Provider, MD  Deferasirox (JADENU) 360 MG TABS Take 1,080 mg by mouth daily.     Historical Provider, MD  folic acid (FOLVITE) 1 MG tablet Take 1 mg by mouth daily.    Historical Provider, MD  gabapentin (NEURONTIN) 300 MG capsule Take 1 capsule (300 mg total) by mouth 3 (three) times daily. 06/01/15   Tresa Garter, MD  HYDROmorphone (DILAUDID) 4 MG tablet Take 1 tablet (4 mg total) by mouth every 4 (four) hours as needed for moderate pain or severe pain. 04/05/16 04/20/16  Tresa Garter, MD  hydroxyurea (HYDREA) 500 MG capsule Take 2 capsules (1,000 mg total) by mouth daily. May take with food to minimize GI side effects. 03/17/16   [name], MD  morphine (MS CONTIN) 30 MG 12 hr tablet Take 1 tablet (30 mg total) by mouth every 12 (twelve) hours. 04/05/16 05/05/16  Tresa Garter, MD  rivaroxaban (XARELTO) 20 MG TABS tablet Take 1 tablet (20 mg total) by mouth at bedtime. 12/29/15   Tresa Garter, MD  zolpidem (AMBIEN) 10 MG tablet Take 1 tablet (10 mg total) by mouth at bedtime as needed for sleep. 04/05/16 05/05/16  Tresa Garter, MD    Physical Exam: Vitals:   04/10/16 0200 04/10/16 0330 04/10/16 0345 04/10/16 0400  BP: 120/84 118/82  116/75  Pulse:  99 91 93  Resp:      Temp:      TempSrc:      SpO2:  (!) 75% (!) 76% (!) 77%  Weight:      Height:       General: Not in acute distress HEENT:       Eyes: PERRL, EOMI, no  scleral icterus.       ENT: No discharge from the ears and nose, no pharynx injection, no tonsillar enlargement.        Neck: No JVD, no bruit, no mass felt. Heme: No neck lymph node enlargement. Cardiac: S1/S2, RRR, No murmurs, No gallops or rubs. Respiratory: No rales, wheezing, rhonchi or rubs. GI: Soft, nondistended, nontender, no rebound pain, no organomegaly, BS present. GU: No hematuria Ext: No pitting leg edema bilaterally. 2+DP/PT pulse bilaterally. Musculoskeletal: No joint deformities, No joint redness or warmth, no limitation of ROM in spin. Skin: No rashes.  Neuro:  drowsy, oriented X3, cranial nerves II-XII grossly intact, moves all extremities normally. Psych: Patient is not psychotic, no suicidal or hemocidal ideation.  Labs on Admission: I have personally reviewed following labs and imaging studies  CBC:  Recent Labs Lab 04/05/16 1007 04/07/16 1108 04/09/16 0436 04/10/16 0011  WBC 13.9* 16.2* 17.4* 23.5*  NEUTROABS 9.9* 12.6* 14.8* 17.8*  HGB 7.8* 8.2* 8.2* 8.0*  HCT 22.3* 23.7* 23.6* 22.2*  MCV 85.4 89.4 90.4 87.1  PLT 533* 627* 657* 242*   Basic Metabolic Panel:  Recent Labs Lab 04/05/16 1007 04/07/16 1108 04/09/16 0436 04/10/16 0011  NA 135 134* 136 135  K 3.6 3.6 3.8 3.6  CL 104 105 109 109  CO2 23 24 20* 22  GLUCOSE 110* 106* 107* 109*  BUN 11 8 12 10   CREATININE 0.88 0.72 0.90 0.92  CALCIUM 9.5 9.0 9.0 8.9   GFR: Estimated Creatinine Clearance: 121.8 mL/min (by C-G formula based on SCr of 0.92 mg/dL). Liver Function Tests:  Recent Labs Lab 04/05/16 1007 04/07/16 1108 04/10/16 0011  AST 52* 36 35  ALT 31 24 20   ALKPHOS 115 106 96  BILITOT 6.2* 4.7* 4.8*  PROT 8.1 8.0 7.7  ALBUMIN 4.4 4.3 4.2   No results for input(s): LIPASE, AMYLASE in the last 168 hours. No results for input(s): AMMONIA in the last 168 hours. Coagulation Profile: No results for input(s): INR, PROTIME in the last 168 hours. Cardiac Enzymes: No results for  input(s): CKTOTAL, CKMB, CKMBINDEX, TROPONINI in the last 168 hours. BNP (last 3 results) No results for input(s): PROBNP in the last 8760 hours. HbA1C: No results for input(s): HGBA1C in the last 72 hours. CBG: No results for input(s): GLUCAP in the last 168 hours. Lipid Profile: No results for input(s): CHOL, HDL, LDLCALC, TRIG, CHOLHDL, LDLDIRECT in the last 72 hours. Thyroid Function Tests: No results for input(s): TSH, T4TOTAL, FREET4, T3FREE, THYROIDAB in the last 72 hours. Anemia Panel:  Recent Labs  04/09/16 0436 04/10/16 0011  RETICCTPCT 19.1* 20.9*   Urine analysis:    Component Value Date/Time   COLORURINE YELLOW 03/13/2016 1026   APPEARANCEUR CLEAR 03/13/2016 1026   LABSPEC 1.010 03/13/2016 1026   PHURINE 7.0 03/13/2016 1026   GLUCOSEU NEGATIVE 03/13/2016 1026   HGBUR SMALL (A) 03/13/2016 1026   BILIRUBINUR NEGATIVE 03/13/2016 1026   KETONESUR NEGATIVE 03/13/2016 1026   PROTEINUR NEGATIVE 03/13/2016 1026   UROBILINOGEN 0.2 03/24/2015 1426   NITRITE NEGATIVE 03/13/2016 1026   LEUKOCYTESUR NEGATIVE 03/13/2016 1026   Sepsis Labs: @LABRCNTIP (procalcitonin:4,lacticidven:4) )No results found for this or any previous visit (from the past 240 hour(s)).   Radiological Exams on Admission: Dg Chest 2 View  Result Date: 04/10/2016 CLINICAL DATA:  Bilateral chest wall pain. Sickle cell disease. Pain began yesterday. EXAM: CHEST  2 VIEW COMPARISON:  03/27/2016 FINDINGS: Left subclavian port extends to the low SVC. Unchanged cardiomegaly. No airspace consolidation. No pleural effusions. Pulmonary vasculature is normal. Hilar and mediastinal contours are unremarkable and unchanged. IMPRESSION: Unchanged cardiomegaly.  No consolidation or effusion. Electronically Signed   By: Andreas Newport M.D.   On: 04/10/2016 00:50     EKG: Not done in ED, will get one.   Assessment/Plan Principal Problem:   Sickle cell pain crisis ([place]) Active Problems:   Hx of pulmonary  embolus   Pulmonary HTN   Chronic anticoagulation   Essential hypertension   Chronic respiratory failure with hypoxia (HCC)   Cor pulmonale, chronic (HCC)   Sickle cell pain crisis ([place]):  CXR is negative, less likely to have acute chest syndrome clinically. Hemoglobin stable, 8/0. No indication to blood transfusion now. He is compliant to Xarelto, less likely to have new PE.  -will admit to tele bed as inpt -continue folic acid and hydroxyurea -continue Jadenu for secondary Hemochromatosis -IVF: D5-1/2NS at 125cc/h -Zofran for nausea -Start high dose PCA protocol for pain: loading dose 1.5 mg, bolus dose 0.8 mg, lockout interval 10 min and one hour dose limit at 4.8 mg. - continue home MS Contin - will not start Toradol since pt is on Xarelto -f/u trop x 3 and EKG  Chronic respiratory failure secondary to pulmonary hypertension and Cor pulmonale:Oxygen saturation 80-87%, but patient denies SOB.  -prn Nasal cannula oxygen to maintain oxygen saturation -continue letairis  Hx of pulmonary embolus and DVT: -continue Xarelto  Hx of stroke: has aphasia. -continue ASA  HTN: -continue coreg  DVT ppx: On Xarelto Code Status: Full code Family Communication: None at bed side. Disposition Plan:  Anticipate discharge back to previous home environment Consults called:  none Admission status: Inpatient/tele      Date of Service 04/10/2016    Ivor Costa [facility] Pager [phone]  If 7PM-7AM, please contact night-coverage [email or web address] Password [facility] 04/10/2016, 4:32 AM

## 2016-04-10 NOTE — Progress Notes (Signed)
Pt arrived to unit room 1513. VS taken. Pain 0/10. Pt oriented to room and callbell with no complications. Will continue to monitor and report to oncoming RN

## 2016-04-10 NOTE — ED Notes (Signed)
Daylight savings time:Patient somnolent-O2 sat 80-85% on NS. Stimulated patient-sat only to 85%-NRB at 100% applied with sat increased to 100% with the O2 and stimulation. Dr. Betsey Holiday made aware.

## 2016-04-11 ENCOUNTER — Encounter (HOSPITAL_COMMUNITY): Payer: Self-pay | Admitting: Internal Medicine

## 2016-04-11 ENCOUNTER — Inpatient Hospital Stay (HOSPITAL_COMMUNITY): Payer: Medicare Other

## 2016-04-11 DIAGNOSIS — Z7901 Long term (current) use of anticoagulants: Secondary | ICD-10-CM

## 2016-04-11 DIAGNOSIS — I2781 Cor pulmonale (chronic): Secondary | ICD-10-CM

## 2016-04-11 DIAGNOSIS — Z79891 Long term (current) use of opiate analgesic: Secondary | ICD-10-CM

## 2016-04-11 DIAGNOSIS — Z86711 Personal history of pulmonary embolism: Secondary | ICD-10-CM

## 2016-04-11 DIAGNOSIS — D72829 Elevated white blood cell count, unspecified: Secondary | ICD-10-CM

## 2016-04-11 DIAGNOSIS — D638 Anemia in other chronic diseases classified elsewhere: Secondary | ICD-10-CM

## 2016-04-11 DIAGNOSIS — G894 Chronic pain syndrome: Secondary | ICD-10-CM

## 2016-04-11 DIAGNOSIS — I272 Pulmonary hypertension, unspecified: Secondary | ICD-10-CM

## 2016-04-11 DIAGNOSIS — I10 Essential (primary) hypertension: Secondary | ICD-10-CM

## 2016-04-11 DIAGNOSIS — J810 Acute pulmonary edema: Secondary | ICD-10-CM | POA: Diagnosis present

## 2016-04-11 DIAGNOSIS — D5700 Hb-SS disease with crisis, unspecified: Principal | ICD-10-CM

## 2016-04-11 DIAGNOSIS — J9621 Acute and chronic respiratory failure with hypoxia: Secondary | ICD-10-CM

## 2016-04-11 LAB — BASIC METABOLIC PANEL
Anion gap: 8 (ref 5–15)
BUN: 12 mg/dL (ref 6–20)
CALCIUM: 9.1 mg/dL (ref 8.9–10.3)
CO2: 21 mmol/L — AB (ref 22–32)
CREATININE: 0.84 mg/dL (ref 0.61–1.24)
Chloride: 107 mmol/L (ref 101–111)
GFR calc non Af Amer: >60 mL/min (ref >60)
Glucose, Bld: 118 mg/dL — ABNORMAL HIGH (ref 65–99)
Potassium: 4.3 mmol/L (ref 3.5–5.1)
SODIUM: 136 mmol/L (ref 135–145)

## 2016-04-11 LAB — CBC WITH DIFFERENTIAL/PLATELET
Basophils Absolute: 0 10*3/uL (ref 0.0–0.1)
Basophils Relative: 0 %
Eosinophils Absolute: 0.9 10*3/uL — ABNORMAL HIGH (ref 0.0–0.7)
Eosinophils Relative: 4 %
HCT: 21.8 % — ABNORMAL LOW (ref 39.0–52.0)
Hemoglobin: 7.6 g/dL — ABNORMAL LOW (ref 13.0–17.0)
Lymphocytes Relative: 12 %
Lymphs Abs: 2.6 10*3/uL (ref 0.7–4.0)
MCH: 31.1 pg (ref 26.0–34.0)
MCHC: 34.9 g/dL (ref 30.0–36.0)
MCV: 89.3 fL (ref 78.0–100.0)
Monocytes Absolute: 2.6 10*3/uL — ABNORMAL HIGH (ref 0.1–1.0)
Monocytes Relative: 12 %
Neutro Abs: 15.6 10*3/uL — ABNORMAL HIGH (ref 1.7–7.7)
Neutrophils Relative %: 72 %
Platelets: 551 10*3/uL — ABNORMAL HIGH (ref 150–400)
RBC: 2.44 MIL/uL — ABNORMAL LOW (ref 4.22–5.81)
RDW: 23.2 % — ABNORMAL HIGH (ref 11.5–15.5)
WBC: 21.7 10*3/uL — ABNORMAL HIGH (ref 4.0–10.5)
nRBC: 3 /100 WBC — ABNORMAL HIGH

## 2016-04-11 LAB — BRAIN NATRIURETIC PEPTIDE: B Natriuretic Peptide: 762.8 pg/mL — ABNORMAL HIGH (ref 0.0–100.0)

## 2016-04-11 LAB — RETICULOCYTES
RBC.: 2.44 MIL/uL — ABNORMAL LOW (ref 4.22–5.81)
Retic Ct Pct: >23 % — ABNORMAL HIGH (ref 0.4–3.1)

## 2016-04-11 MED ORDER — HYDROMORPHONE 1 MG/ML IV SOLN
INTRAVENOUS | Status: DC
  Administered 2016-04-11: 7.7 mg via INTRAVENOUS
  Administered 2016-04-11: 4.9 mg via INTRAVENOUS
  Administered 2016-04-11: 23:00:00 via INTRAVENOUS
  Administered 2016-04-11: 12.82 mg via INTRAVENOUS
  Administered 2016-04-12: 0.7 mg via INTRAVENOUS
  Administered 2016-04-12: 4.2 mg via INTRAVENOUS
  Filled 2016-04-11: qty 25

## 2016-04-11 MED ORDER — TORSEMIDE 20 MG PO TABS
20.0000 mg | ORAL_TABLET | Freq: Every day | ORAL | Status: DC
  Administered 2016-04-11 – 2016-04-26 (×16): 20 mg via ORAL
  Filled 2016-04-11 (×16): qty 1

## 2016-04-11 MED ORDER — HYDROMORPHONE HCL 1 MG/ML IJ SOLN
1.0000 mg | Freq: Once | INTRAMUSCULAR | Status: AC
  Administered 2016-04-11: 1 mg via INTRAVENOUS
  Filled 2016-04-11: qty 1

## 2016-04-11 MED ORDER — FUROSEMIDE 10 MG/ML IJ SOLN
20.0000 mg | Freq: Once | INTRAMUSCULAR | Status: AC
  Administered 2016-04-11: 20 mg via INTRAVENOUS
  Filled 2016-04-11: qty 2

## 2016-04-11 NOTE — Care Management Note (Signed)
Case Management Note  Patient Details  Name: BEVIN MAYALL MRN: [id number] Date of Birth: 1980/12/04  Subjective/Objective:   ssc with hypoxia requiring nrb mask                 Action/Plan:Date:  April 11, 2016 Chart reviewed for concurrent status and case management needs. Will continue to follow patient progress. Discharge Planning: following for needs Expected discharge date: [id number] Velva Harman, BSN, [place], [place]  Expected Discharge Date:                  Expected Discharge Plan:  Home/Self Care  In-House Referral:     Discharge planning Services     Post Acute Care Choice:    Choice offered to:     DME Arranged:    DME Agency:     HH Arranged:    [place] Agency:     Status of Service:  In process, will continue to follow  If discussed at Long Length of Stay Meetings, dates discussed:    Additional Comments:  Leeroy Cha, RN 04/11/2016, 10:07 AM

## 2016-04-11 NOTE — Progress Notes (Addendum)
Shift event note: Notified by RN that pt once again found w/ 02 off and 02 sats in the 70's. States this has happened several times today and spoke w/ [facility] who states pt needs transfer to SDU as pt w/ high 02 demand on NRB and has not been able to be weaned to VM. Though there is no acute respiratory distress at this time given pt's high 02 demand and low 02 reserve on NRB clearly needs higher level of care for closer observation. Will transfer to SDU. Repeat CXR in am.   Jeryl Columbia, NP-C [facility] Pager [phone]   Addedum: Pt w/o c/o's overnight but still w/ persistent hypoxia when not wearing mask. Repeat CXR this am c/w pulmonary edema. BNP 762. Stopped IVF's for now. Will add one-time low dose IV Lasix (20 mg). Will continue SDU level of care and defer further changes in pt's plan to sickle cell rounding MD.

## 2016-04-11 NOTE — Progress Notes (Signed)
Patient will intermittently remove venti mask. When this happens his oxygen saturation drops to mid range 70s. Educated patient on his oxygen requirements and the need to keep mask on. Will continue to monitor patient.

## 2016-04-11 NOTE — Progress Notes (Addendum)
SICKLE CELL SERVICE PROGRESS NOTE  [name] STUDY CNO:[id number] DOB: Aug 09, 1980 DOA: 04/09/2016 PCP: [name], MD  Assessment/Plan: Principal Problem:   Sickle cell pain crisis ([place]) Active Problems:   Pulmonary HTN   Cor pulmonale, chronic (HCC)   Chronic anticoagulation   Chronic respiratory failure with hypoxia (HCC)   Hx of pulmonary embolus   Essential hypertension  1. Acute Pulmonary Edema with Hypoxia: Pt continues to have persistent hypoxia which is probably due to pulmonary edema. I will continue Torsemide and wean Oxygen as tolerated.  2. Acute on Chronic Respiratory Failure with Hypoxia: Pt continues to be without supplemental oxygen for prolonged periods of time and as a result is having worsening Oxygenation. This is likely a reflection of worsening PAH associated with hypoxic stateHe continues to have a pattern of increase in Oxygen requirements after being without Oxygen for an extended period of time.  Unfortunately I am sure that his pulmonary HTN is worsening as a result of prolonged Hypoxia and patient is still non-adherent to wearing Oxygen as prescribed. I have spoken with Dr. Doreene Burke and made him aware of the situation with the portable tank as the lack of this device is leading to increased mortality with this patient. 3. Hb SS with crisis: Decrease PCA dose to 0.7 mg and will give a breakthrough oral dose of 1 mg Dilaudid currently. Continue Toradol. IVF decrease to [facility] because of pulmonary edema. Will need to keep close watch on renal function due to Toradol use and inability to receive IVF because of pulmonary edema.  4. Leukocytosis: Likely related to crisis. No clinical evidence of infection.  5. Anemia of chronic disease:  Hb at baseline. 6. Chronic pain: Continue MS Contin 7. Chronic Anticoagulation: Pt on Xarelto for recurrent PE's. Continue Xarelto. Pt reports that he has missed 1 dose of Xarelto. However I do not feel that this one dose has conferred  an increased risk of PE. Nevertheless the treatment would be to continue Xarelto.  8. Benign HTN: BP will controlled with current medications except with pain when they become elevated with pain.  Code Status: Full Code Family Communication: N/A Disposition Plan: Not yet ready for discharge.   Pairlee Sawtell A.  Pager [phone]. If 7PM-7AM, please contact night-coverage.  04/11/2016, 10:51 AM  LOS: 1 day   Interim History: Pt reports that he was without his Oxygen for more than 6 hours on Wednesday and started having pain in his ribs on Thursday. This is a pattern that has been repeated several times in the last month. I have had multiple discussions about the impact of being without his Oxygen has on his Sickle Cell disease as well as on his Pulmonary Arterial Hypertension. He has not had a recent red cell exchange and does not have an appointment scheduled.  Pt reports pain as 8/10 and localized to his ribs bilaterally. He has used only 39.7 mg on the PCa in the last 24 hours with 50/49:demands/deliveries which is less than 50 % of available medication. Encouraged increased use of the PCA.   Consultants:  None  Procedures:  None  Antibiotics:  None  Objective: Vitals:   04/11/16 0742 04/11/16 0800 04/11/16 0933 04/11/16 1045  BP: 131/88 (!) 144/78  125/82  Pulse:      Resp: 15 (!) 24 (!) 31 (!) 21  Temp:  97.5 F (36.4 C)    TempSrc:  Oral    SpO2: 97% 95% 94% 92%  Weight:      Height:  Weight change: 0.045 kg (1.6 oz)  Intake/Output Summary (Last 24 hours) at 04/11/16 1051 Last data filed at 04/11/16 1040  Gross per 24 hour  Intake          2136.25 ml  Output             3675 ml  Net         -1538.75 ml    General: Alert, awake, oriented x3, in no apparent distress. HEENT: [place]/AT PEERL, EOMI, anicteric.  Heart: Regular rate and rhythm, II/VI systolic ejection murmurs at base, no rubs or gallops, PMI non-displaced, no heaves or thrills on palpation.  Lungs:  Clear to auscultation except at b/l bases where there are mild crackles. No wheezing or rhonchi noted. No increased vocal fremitus resonant to percussion  Abdomen: Soft, nontender, nondistended, positive bowel sounds, no masses no hepatosplenomegaly noted.  Neuro: No focal neurological deficits noted cranial nerves II through XII grossly intact. . Strength at functional baseline in bilateral upper and lower extremities. Musculoskeletal: No warmth swelling or erythema around joints, no spinal tenderness noted. Psychiatric: Patient alert and oriented x3, good insight and cognition, good recent to remote recall.    Data Reviewed: Basic Metabolic Panel:  Recent Labs Lab 04/05/16 1007 04/07/16 1108 04/09/16 0436 04/10/16 0011 04/10/16 0646  NA 135 134* 136 135  --   K 3.6 3.6 3.8 3.6  --   CL 104 105 109 109  --   CO2 23 24 20* 22  --   GLUCOSE 110* 106* 107* 109*  --   BUN 11 8 12 10   --   CREATININE 0.88 0.72 0.90 0.92  --   CALCIUM 9.5 9.0 9.0 8.9  --   MG  --   --   --   --  1.8   Liver Function Tests:  Recent Labs Lab 04/05/16 1007 04/07/16 1108 04/10/16 0011  AST 52* 36 35  ALT 31 24 20   ALKPHOS 115 106 96  BILITOT 6.2* 4.7* 4.8*  PROT 8.1 8.0 7.7  ALBUMIN 4.4 4.3 4.2   No results for input(s): LIPASE, AMYLASE in the last 168 hours. No results for input(s): AMMONIA in the last 168 hours. CBC:  Recent Labs Lab 04/05/16 1007 04/07/16 1108 04/09/16 0436 04/10/16 0011 04/11/16 0351  WBC 13.9* 16.2* 17.4* 23.5* 21.7*  NEUTROABS 9.9* 12.6* 14.8* 17.8* 15.6*  HGB 7.8* 8.2* 8.2* 8.0* 7.6*  HCT 22.3* 23.7* 23.6* 22.2* 21.8*  MCV 85.4 89.4 90.4 87.1 89.3  PLT 533* 627* 657* 633* 551*   Cardiac Enzymes:  Recent Labs Lab 04/10/16 0646 04/10/16 1213 04/10/16 1850  TROPONINI 0.71* 0.90* 1.23*   BNP (last 3 results)  Recent Labs  03/31/16 0600 04/02/16 0455 04/11/16 0351  BNP 697.0* 559.5* 762.8*    ProBNP (last 3 results) No results for input(s):  PROBNP in the last 8760 hours.  CBG: No results for input(s): GLUCAP in the last 168 hours.  Recent Results (from the past 240 hour(s))  MRSA PCR Screening     Status: None   Collection Time: 04/10/16  9:12 PM  Result Value Ref Range Status   MRSA by PCR NEGATIVE NEGATIVE Final    Comment:        The GeneXpert MRSA Assay (FDA approved for NASAL specimens only), is one component of a comprehensive MRSA colonization surveillance program. It is not intended to diagnose MRSA infection nor to guide or monitor treatment for MRSA infections.      Studies: Dg Chest  2 View  Result Date: 04/10/2016 CLINICAL DATA:  Bilateral chest wall pain. Sickle cell disease. Pain began yesterday. EXAM: CHEST  2 VIEW COMPARISON:  03/27/2016 FINDINGS: Left subclavian port extends to the low SVC. Unchanged cardiomegaly. No airspace consolidation. No pleural effusions. Pulmonary vasculature is normal. Hilar and mediastinal contours are unremarkable and unchanged. IMPRESSION: Unchanged cardiomegaly.  No consolidation or effusion. Electronically Signed   By: Andreas Newport M.D.   On: 04/10/2016 00:50   Dg Chest 2 View  Result Date: 03/27/2016 CLINICAL DATA:  Left chest pain.  Sickle cell anemia EXAM: CHEST  2 VIEW COMPARISON:  03/22/2016 FINDINGS: Cardiac enlargement with vascular congestion. Negative for edema or effusion. Negative for pneumonia. Port-A-Cath tip in the SVC. IMPRESSION: Cardiac enlargement with pulmonary vascular congestion. Negative for pneumonia or edema. Electronically Signed   By: Franchot Gallo M.D.   On: 03/27/2016 16:02   Dg Chest 2 View  Result Date: 03/22/2016 CLINICAL DATA:  Chest/bilateral rib pain since last night. Sickle cell crisis. EXAM: CHEST  2 VIEW COMPARISON:  Chest CT 03/13/2016 and radiographs 03/11/2016 FINDINGS: Left subclavian Port-A-Cath remains in place, with tip overlying the lower SVC. Cardiac silhouette remains moderately enlarged, unchanged. Mild diffuse chronic  interstitial accentuation is unchanged. Focal scarring is noted in the right mid lung. There is no evidence of acute airspace consolidation, pulmonary edema, pleural effusion, or pneumothorax. No acute osseous abnormality is seen. IMPRESSION: No active cardiopulmonary disease. Electronically Signed   By: Logan Bores M.D.   On: 03/22/2016 08:18   Ct Angio Chest Pe W And/or Wo Contrast  Result Date: 03/13/2016 CLINICAL DATA:  History of sickle cell. Left-sided chest pain starting this morning about 3 a.m. EXAM: CT ANGIOGRAPHY CHEST WITH CONTRAST TECHNIQUE: Multidetector CT imaging of the chest was performed using the standard protocol during bolus administration of intravenous contrast. Multiplanar CT image reconstructions and MIPs were obtained to evaluate the vascular anatomy. CONTRAST:  100 mL Isovue 370 COMPARISON:  CT chest 03/06/2016, 02/21/2016, 01/05/2016 FINDINGS: Cardiovascular: Satisfactory opacification of the pulmonary arteries to the segmental level. No evidence of pulmonary embolism. Stable cardiomegaly. No pericardial effusion. Normal caliber thoracic aorta. No thoracic aortic atherosclerosis or dissection. Left-sided Port-A-Cath again noted. Mediastinum/Nodes: Thyroid gland, trachea, and esophagus demonstrate no significant findings. Multiple small prevascular lymph nodes unchanged compared with the prior exams. Mildly enlarged stable subcarinal lymph node. No axillary lymphadenopathy. Lungs/Pleura: Patchy areas of ground-glass opacities in bilateral upper lobes and lower lobes which may reflect mild pulmonary edema versus alveolitis secondary to an infectious or inflammatory etiology. No pleural effusion or pneumothorax. Upper Abdomen: No acute abnormality in the upper abdomen. Chronic calcified, involuted spleen. Musculoskeletal: No acute osseous abnormality. No lytic or sclerotic osseous lesion. Soft tissue: Bilateral retroareolar fibroglandular soft tissue as can be seen with gynecomastia.  Review of the MIP images confirms the above findings. IMPRESSION: 1. No evidence of acute pulmonary embolus. 2. Stable cardiomegaly. 3. Patchy areas of ground-glass opacities in bilateral upper lobes and lower lobes which may reflect mild pulmonary edema versus alveolitis secondary to an infectious or inflammatory etiology. Electronically Signed   By: Kathreen Devoid   On: 03/13/2016 10:13   Dg Chest Port 1 View  Result Date: 04/11/2016 CLINICAL DATA:  Initial evaluation for sickle cell pain crisis. Worsened shortness of breath. EXAM: PORTABLE CHEST 1 VIEW COMPARISON:  Prior radiograph from 04/09/2016. FINDINGS: Left-sided Port-A-Cath in place, stable. Severe cardiomegaly not significantly changed. Mediastinal silhouette within normal limits. Lungs mildly hypoinflated. There is slightly worsened pulmonary  vascular congestion as compared to previous. Slightly worsened right infrahilar opacity, which may reflect developing infiltrate and/or atelectasis. More linear opacities at the bilateral lung bases most consistent with atelectasis. No pleural effusion. No pneumothorax. No acute osseous abnormality. IMPRESSION: 1. Severe cardiomegaly with slightly worsened diffuse pulmonary vascular congestion as compared to 04/09/16, which may reflect developing pulmonary edema. 2. Slightly patchy right infrahilar opacity, which may reflect congestion and/or developing infiltrate. 3. Mildly increased superimposed bibasilar atelectasis. Electronically Signed   By: Jeannine Boga M.D.   On: 04/11/2016 06:10    Scheduled Meds: . ambrisentan  5 mg Oral Daily  . aspirin  81 mg Oral Daily  . carvedilol  3.125 mg Oral BID WC  . cholecalciferol  2,000 Units Oral Daily  . Deferasirox  1,080 mg Oral Daily  . folic acid  1 mg Oral Daily  . gabapentin  300 mg Oral TID  . HYDROmorphone   Intravenous Q4H  .  HYDROmorphone (DILAUDID) injection  1 mg Intravenous Once  . HYDROmorphone  4 mg Oral Q4H  . hydroxyurea  1,000 mg  Oral Daily  . ketorolac  30 mg Intravenous Q6H  . mouth rinse  15 mL Mouth Rinse BID  . morphine  30 mg Oral Q12H  . rivaroxaban  20 mg Oral QHS  . senna-docusate  1 tablet Oral BID  . torsemide  20 mg Oral Daily   Continuous Infusions:   Principal Problem:   Sickle cell pain crisis (HCC) Active Problems:   Pulmonary HTN   Cor pulmonale, chronic (HCC)   Chronic anticoagulation   Chronic respiratory failure with hypoxia (HCC)   Hx of pulmonary embolus   Essential hypertension   In excess of 35 minutes spent during this visit. Greater than 50% involved face to face contact with the patient for assessment, counseling and coordination of care.

## 2016-04-12 DIAGNOSIS — R9431 Abnormal electrocardiogram [ECG] [EKG]: Secondary | ICD-10-CM

## 2016-04-12 DIAGNOSIS — R748 Abnormal levels of other serum enzymes: Secondary | ICD-10-CM

## 2016-04-12 LAB — BLOOD GAS, ARTERIAL
ACID-BASE DEFICIT: 2.3 mmol/L — AB (ref 0.0–2.0)
ACID-BASE DEFICIT: 1.8 mmol/L (ref 0.0–2.0)
Bicarbonate: 23 mmol/L (ref 20.0–28.0)
Bicarbonate: 23.8 mmol/L (ref 20.0–28.0)
DRAWN BY: 103701
Drawn by: 295031
FIO2: 50
FIO2: 50
O2 SAT: 65.7 %
O2 Saturation: 35.3 %
PCO2 ART: 45.1 mmHg (ref 32.0–48.0)
PH ART: 7.315 — AB (ref 7.350–7.450)
PO2 ART: 45.2 mmHg — AB (ref 83.0–108.0)
Patient temperature: 98.6
Patient temperature: 98.6
pCO2 arterial: 48.1 mmHg — ABNORMAL HIGH (ref 32.0–48.0)
pH, Arterial: 7.328 — ABNORMAL LOW (ref 7.350–7.450)

## 2016-04-12 LAB — CBC WITH DIFFERENTIAL/PLATELET
BASOS ABS: 0.2 10*3/uL — AB (ref 0.0–0.1)
Basophils Relative: 1 %
Eosinophils Absolute: 0.7 10*3/uL (ref 0.0–0.7)
Eosinophils Relative: 4 %
HEMATOCRIT: 21.5 % — AB (ref 39.0–52.0)
HEMOGLOBIN: 7.7 g/dL — AB (ref 13.0–17.0)
LYMPHS PCT: 11 %
Lymphs Abs: 2 10*3/uL (ref 0.7–4.0)
MCH: 31.6 pg (ref 26.0–34.0)
MCHC: 35.8 g/dL (ref 30.0–36.0)
MCV: 88.1 fL (ref 78.0–100.0)
Monocytes Absolute: 2 10*3/uL — ABNORMAL HIGH (ref 0.1–1.0)
Monocytes Relative: 11 %
NEUTROS ABS: 13.2 10*3/uL — AB (ref 1.7–7.7)
Neutrophils Relative %: 73 %
Platelets: 506 10*3/uL — ABNORMAL HIGH (ref 150–400)
RBC: 2.44 MIL/uL — AB (ref 4.22–5.81)
RDW: 23.9 % — ABNORMAL HIGH (ref 11.5–15.5)
WBC: 18.1 10*3/uL — AB (ref 4.0–10.5)

## 2016-04-12 LAB — TROPONIN I
TROPONIN I: 0.6 ng/mL — AB (ref <0.03)
Troponin I: 0.66 ng/mL (ref <0.03)

## 2016-04-12 LAB — BASIC METABOLIC PANEL
ANION GAP: 6 (ref 5–15)
BUN: 19 mg/dL (ref 6–20)
CALCIUM: 8.7 mg/dL — AB (ref 8.9–10.3)
CO2: 23 mmol/L (ref 22–32)
Chloride: 109 mmol/L (ref 101–111)
Creatinine, Ser: 1.07 mg/dL (ref 0.61–1.24)
GFR calc non Af Amer: >60 mL/min (ref >60)
Glucose, Bld: 114 mg/dL — ABNORMAL HIGH (ref 65–99)
Potassium: 4.1 mmol/L (ref 3.5–5.1)
SODIUM: 138 mmol/L (ref 135–145)

## 2016-04-12 LAB — RETICULOCYTES
RBC.: 2.44 MIL/uL — AB (ref 4.22–5.81)
Retic Ct Pct: >23 % — ABNORMAL HIGH (ref 0.4–3.1)

## 2016-04-12 MED ORDER — HYDROMORPHONE HCL 1 MG/ML IJ SOLN
1.0000 mg | Freq: Once | INTRAMUSCULAR | Status: AC
  Administered 2016-04-12: 1 mg via INTRAVENOUS
  Filled 2016-04-12: qty 1

## 2016-04-12 MED ORDER — CHLORHEXIDINE GLUCONATE CLOTH 2 % EX PADS
6.0000 | MEDICATED_PAD | Freq: Every day | CUTANEOUS | Status: DC
  Administered 2016-04-13 – 2016-04-26 (×11): 6 via TOPICAL

## 2016-04-12 MED ORDER — HYDROMORPHONE HCL 4 MG PO TABS
6.0000 mg | ORAL_TABLET | ORAL | Status: DC
  Administered 2016-04-12 (×3): 6 mg via ORAL
  Administered 2016-04-13: 08:00:00 2 mg via ORAL
  Administered 2016-04-13 – 2016-04-25 (×66): 6 mg via ORAL
  Filled 2016-04-12: qty 3
  Filled 2016-04-12 (×3): qty 1
  Filled 2016-04-12: qty 3
  Filled 2016-04-12 (×3): qty 1
  Filled 2016-04-12: qty 3
  Filled 2016-04-12: qty 1
  Filled 2016-04-12 (×2): qty 3
  Filled 2016-04-12: qty 1
  Filled 2016-04-12 (×2): qty 3
  Filled 2016-04-12: qty 1
  Filled 2016-04-12 (×5): qty 3
  Filled 2016-04-12: qty 1
  Filled 2016-04-12: qty 3
  Filled 2016-04-12 (×2): qty 1
  Filled 2016-04-12: qty 3
  Filled 2016-04-12 (×5): qty 1
  Filled 2016-04-12 (×2): qty 3
  Filled 2016-04-12 (×3): qty 1
  Filled 2016-04-12: qty 3
  Filled 2016-04-12: qty 1
  Filled 2016-04-12 (×2): qty 3
  Filled 2016-04-12: qty 1
  Filled 2016-04-12 (×2): qty 3
  Filled 2016-04-12 (×2): qty 1
  Filled 2016-04-12: qty 3
  Filled 2016-04-12: qty 1
  Filled 2016-04-12: qty 3
  Filled 2016-04-12 (×3): qty 1
  Filled 2016-04-12: qty 3
  Filled 2016-04-12: qty 1
  Filled 2016-04-12: qty 3
  Filled 2016-04-12 (×2): qty 1
  Filled 2016-04-12 (×2): qty 3
  Filled 2016-04-12 (×2): qty 1
  Filled 2016-04-12 (×2): qty 3
  Filled 2016-04-12: qty 1
  Filled 2016-04-12: qty 3
  Filled 2016-04-12: qty 1
  Filled 2016-04-12 (×3): qty 3
  Filled 2016-04-12 (×3): qty 1
  Filled 2016-04-12: qty 3
  Filled 2016-04-12: qty 1

## 2016-04-12 MED ORDER — RIVAROXABAN 20 MG PO TABS
20.0000 mg | ORAL_TABLET | Freq: Every day | ORAL | Status: DC
  Administered 2016-04-12 – 2016-04-26 (×15): 20 mg via ORAL
  Filled 2016-04-12 (×15): qty 1

## 2016-04-12 MED ORDER — HYDROMORPHONE 1 MG/ML IV SOLN
INTRAVENOUS | Status: DC
  Administered 2016-04-12: 2.1 mg via INTRAVENOUS
  Administered 2016-04-12: 7.7 mg via INTRAVENOUS
  Administered 2016-04-12 – 2016-04-13 (×2): via INTRAVENOUS
  Administered 2016-04-13: 9.8 mg via INTRAVENOUS
  Administered 2016-04-13: 1 mg via INTRAVENOUS
  Administered 2016-04-13: 12:00:00 via INTRAVENOUS
  Administered 2016-04-13: 2.8 mg via INTRAVENOUS
  Administered 2016-04-13: 8 mg via INTRAVENOUS
  Administered 2016-04-14: 6.9 mg via INTRAVENOUS
  Administered 2016-04-14: 0.7 mg via INTRAVENOUS
  Administered 2016-04-14: 17:00:00 via INTRAVENOUS
  Administered 2016-04-14 (×2): 1 mg via INTRAVENOUS
  Administered 2016-04-14: 11.5 mg via INTRAVENOUS
  Administered 2016-04-14: 7.3 mg via INTRAVENOUS
  Administered 2016-04-14: 0.7 mg via INTRAVENOUS
  Administered 2016-04-14: 8.7 mg via INTRAVENOUS
  Administered 2016-04-14: 1 mg via INTRAVENOUS
  Administered 2016-04-15 (×2): 7.7 mg via INTRAVENOUS
  Administered 2016-04-15: 8.7 mg via INTRAVENOUS
  Administered 2016-04-15: 2.8 mg via INTRAVENOUS
  Administered 2016-04-15: 0.7 mg via INTRAVENOUS
  Administered 2016-04-15: 8 mg via INTRAVENOUS
  Administered 2016-04-15 (×2): via INTRAVENOUS
  Administered 2016-04-15: 0.7 mg via INTRAVENOUS
  Administered 2016-04-16: 1.7 mg via INTRAVENOUS
  Filled 2016-04-12 (×6): qty 25

## 2016-04-12 NOTE — Progress Notes (Signed)
SICKLE CELL SERVICE PROGRESS NOTE  [name] KGY:[id number] DOB: Sep 22, 1980 DOA: 04/09/2016 PCP: [name], MD  Assessment/Plan: Principal Problem:   Sickle cell pain crisis ([place]) Active Problems:   Pulmonary HTN   Cor pulmonale, chronic (HCC)   Chronic anticoagulation   Anemia of chronic disease   Acute on chronic respiratory failure with hypoxia (HCC)   Hx of pulmonary embolus   Essential hypertension   Troponin level elevated   Prolonged Q-T interval on ECG   Acute pulmonary edema ([place])  1. Acute Pulmonary Edema with Hypoxia: Pt continues to have persistent hypoxia which is probably due to pulmonary edema.  His urine output has been minimal today. I will continue Torsemide and consider a doser of Lasix if U/O still decreased by this 2:00pm. Wean Oxygen as tolerated.  2. Acute on Chronic Respiratory Failure with Hypoxia: Pt continues to be without supplemental oxygen for prolonged periods of time and as a result is having worsening Oxygenation. I have spoken with Dr. Doreene Burke and made him aware of the situation with the portable tank as the lack of this device is leading to increased mortality with this patient. He has not been able to wean Oxygen due to saturations dropping to 70's when on 50% venti-mask during the night. Will obtain ABG on 50 % Oxygen. If still not improving will need to consider transfusion.  3. Hb SS with crisis: Continue PCA dose to 0.7 mg and will give PRN breakthrough doses as needed every 4 hours as the pain in his ribcage is severely affecting his ability to breath deeply and contributing to his hypoxia which in this patient is a risk for increased mortality.  Continue Toradol.Pt has had a small increase in BUN and Cr but will continue an keep close monitor.  IVF decrease to [facility] because of pulmonary edema.  4. Leukocytosis: Improving. Likely related to crisis. No clinical evidence of infection.  5. Anemia of chronic disease:  Hb at  baseline. 6. Chronic pain: Continue MS Contin 7. Chronic Anticoagulation: Pt on Xarelto for recurrent PE's. Continue Xarelto. Pt reports that he has missed 1 dose of Xarelto. However I do not feel that this one dose has conferred an increased risk of PE. Nevertheless the treatment would be to continue Xarelto.  8. Benign HTN: BP will controlled with current medications except with pain when they become elevated with pain.  Code Status: Full Code Family Communication: N/A Disposition Plan: Not yet ready for discharge.   Breauna Mazzeo A.  Pager [phone]. If 7PM-7AM, please contact night-coverage.  04/12/2016, 11:37 AM  LOS: 2 days   Interim History: Pt reports that he was without his Oxygen for more than 6 hours on Wednesday and started having pain in his ribs on Thursday. This is a pattern that has been repeated several times in the last month. I have had multiple discussions about the impact of being without his Oxygen has on his Sickle Cell disease as well as on his Pulmonary Arterial Hypertension. He has not had a recent red cell exchange and does not have an appointment scheduled. Pt confirms that he has not been taking Jadenu for at least 1 month as his mother researched the medication online and advised him against taking it due to risk to kidneys. I have discussed risk and benefits in his individual case and he acknowledges that he is willing to resume Jadenu after discharge.   Pt reports pain as 8/10 and localized to his ribs bilaterally. He has used only  25.2 mg on the PCA in the last 24 hours with 37/36:demands/deliveries which is less than 50 % of available medication. However he has continued to have hypoxia and has been somnolent and unable to use PCA.  Encouraged increased use of the PCA while awake.    Review of Records:  2-D ECHO ([facility]-01/06/16): Summary Technically adequate study Dilated right atrium and right ventricle with abnormal right ventricular systolic  function consistent with acute strain on the right ventricle such as pulmonary embolus Other chambers appear structurally normal Normal LV systolic function , flattened intraventricular septum consistent with pressure overload on the right side, normal LV systolic ejection fraction calculated at 68% Valves appear structurally normal mild mitral regurgitation Moderate to severe tricuspid regurgitation with moderate pulmonary hypertension right ventricular systolic pressure estimated of 60 mmHg No pericardial effusion The findings are consistent with acute/ recent pulmonary embolus Signature   Consultants:  None  Procedures:  None  Antibiotics:  None  Objective: Vitals:   04/12/16 0432 04/12/16 0800 04/12/16 0825 04/12/16 0826  BP:  98/73 98/73   Pulse:  86 87   Resp:  18  14  Temp:  98.6 F (37 C)    TempSrc:  Oral    SpO2:  100%  100%  Weight: 83.5 kg (184 lb 1.4 oz)     Height:       Weight change: -0.007 kg (-0.3 oz)  Intake/Output Summary (Last 24 hours) at 04/12/16 1137 Last data filed at 04/12/16 1000  Gross per 24 hour  Intake              270 ml  Output             2050 ml  Net            -1780 ml    General: Alert, awake, oriented x3, in no apparent distress. HEENT: [place]/AT PEERL, EOMI, anicteric.  Heart: Regular rate and rhythm, II/VI systolic ejection murmurs at base, no rubs or gallops, PMI non-displaced, no heaves or thrills on palpation.  Lungs: Clear to auscultation except at b/l bases where there are mild crackles. No wheezing or rhonchi noted. No increased vocal fremitus resonant to percussion  Abdomen: Soft, nontender, nondistended, positive bowel sounds, no masses no hepatosplenomegaly noted.  Neuro: No focal neurological deficits noted cranial nerves II through XII grossly intact. . Strength at functional baseline in bilateral upper and lower extremities. Musculoskeletal: No warmth swelling or erythema around joints, no spinal tenderness  noted. Psychiatric: Patient alert and oriented x3, good insight and cognition, good recent to remote recall.    Data Reviewed: Basic Metabolic Panel:  Recent Labs Lab 04/07/16 1108 04/09/16 0436 04/10/16 0011 04/10/16 0646 04/11/16 1250 04/12/16 0432  NA 134* 136 135  --  136 138  K 3.6 3.8 3.6  --  4.3 4.1  CL 105 109 109  --  107 109  CO2 24 20* 22  --  21* 23  GLUCOSE 106* 107* 109*  --  118* 114*  BUN 8 12 10   --  12 19  CREATININE 0.72 0.90 0.92  --  0.84 1.07  CALCIUM 9.0 9.0 8.9  --  9.1 8.7*  MG  --   --   --  1.8  --   --    Liver Function Tests:  Recent Labs Lab 04/07/16 1108 04/10/16 0011  AST 36 35  ALT 24 20  ALKPHOS 106 96  BILITOT 4.7* 4.8*  PROT 8.0 7.7  ALBUMIN 4.3 4.2   No results for input(s): LIPASE, AMYLASE in the last 168 hours. No results for input(s): AMMONIA in the last 168 hours. CBC:  Recent Labs Lab 04/07/16 1108 04/09/16 0436 04/10/16 0011 04/11/16 0351 04/12/16 0432  WBC 16.2* 17.4* 23.5* 21.7* 18.1*  NEUTROABS 12.6* 14.8* 17.8* 15.6* 13.2*  HGB 8.2* 8.2* 8.0* 7.6* 7.7*  HCT 23.7* 23.6* 22.2* 21.8* 21.5*  MCV 89.4 90.4 87.1 89.3 88.1  PLT 627* 657* 633* 551* 506*   Cardiac Enzymes:  Recent Labs Lab 04/10/16 0646 04/10/16 1213 04/10/16 1850  TROPONINI 0.71* 0.90* 1.23*   BNP (last 3 results)  Recent Labs  03/31/16 0600 04/02/16 0455 04/11/16 0351  BNP 697.0* 559.5* 762.8*    ProBNP (last 3 results) No results for input(s): PROBNP in the last 8760 hours.  CBG: No results for input(s): GLUCAP in the last 168 hours.  Recent Results (from the past 240 hour(s))  MRSA PCR Screening     Status: None   Collection Time: 04/10/16  9:12 PM  Result Value Ref Range Status   MRSA by PCR NEGATIVE NEGATIVE Final    Comment:        The GeneXpert MRSA Assay (FDA approved for NASAL specimens only), is one component of a comprehensive MRSA colonization surveillance program. It is not intended to diagnose  MRSA infection nor to guide or monitor treatment for MRSA infections.      Studies: Dg Chest 2 View  Result Date: 04/10/2016 CLINICAL DATA:  Bilateral chest wall pain. Sickle cell disease. Pain began yesterday. EXAM: CHEST  2 VIEW COMPARISON:  03/27/2016 FINDINGS: Left subclavian port extends to the low SVC. Unchanged cardiomegaly. No airspace consolidation. No pleural effusions. Pulmonary vasculature is normal. Hilar and mediastinal contours are unremarkable and unchanged. IMPRESSION: Unchanged cardiomegaly.  No consolidation or effusion. Electronically Signed   By: Andreas Newport M.D.   On: 04/10/2016 00:50   Dg Chest 2 View  Result Date: 03/27/2016 CLINICAL DATA:  Left chest pain.  Sickle cell anemia EXAM: CHEST  2 VIEW COMPARISON:  03/22/2016 FINDINGS: Cardiac enlargement with vascular congestion. Negative for edema or effusion. Negative for pneumonia. Port-A-Cath tip in the SVC. IMPRESSION: Cardiac enlargement with pulmonary vascular congestion. Negative for pneumonia or edema. Electronically Signed   By: Franchot Gallo M.D.   On: 03/27/2016 16:02   Dg Chest 2 View  Result Date: 03/22/2016 CLINICAL DATA:  Chest/bilateral rib pain since last night. Sickle cell crisis. EXAM: CHEST  2 VIEW COMPARISON:  Chest CT 03/13/2016 and radiographs 03/11/2016 FINDINGS: Left subclavian Port-A-Cath remains in place, with tip overlying the lower SVC. Cardiac silhouette remains moderately enlarged, unchanged. Mild diffuse chronic interstitial accentuation is unchanged. Focal scarring is noted in the right mid lung. There is no evidence of acute airspace consolidation, pulmonary edema, pleural effusion, or pneumothorax. No acute osseous abnormality is seen. IMPRESSION: No active cardiopulmonary disease. Electronically Signed   By: Logan Bores M.D.   On: 03/22/2016 08:18   Dg Chest Port 1 View  Result Date: 04/11/2016 CLINICAL DATA:  Initial evaluation for sickle cell pain crisis. Worsened shortness of  breath. EXAM: PORTABLE CHEST 1 VIEW COMPARISON:  Prior radiograph from 04/09/2016. FINDINGS: Left-sided Port-A-Cath in place, stable. Severe cardiomegaly not significantly changed. Mediastinal silhouette within normal limits. Lungs mildly hypoinflated. There is slightly worsened pulmonary vascular congestion as compared to previous. Slightly worsened right infrahilar opacity, which may reflect developing infiltrate and/or atelectasis. More linear opacities at the bilateral lung bases most consistent with atelectasis.  No pleural effusion. No pneumothorax. No acute osseous abnormality. IMPRESSION: 1. Severe cardiomegaly with slightly worsened diffuse pulmonary vascular congestion as compared to 04/09/16, which may reflect developing pulmonary edema. 2. Slightly patchy right infrahilar opacity, which may reflect congestion and/or developing infiltrate. 3. Mildly increased superimposed bibasilar atelectasis. Electronically Signed   By: Jeannine Boga M.D.   On: 04/11/2016 06:10    Scheduled Meds: . ambrisentan  5 mg Oral Daily  . aspirin  81 mg Oral Daily  . carvedilol  3.125 mg Oral BID WC  . cholecalciferol  2,000 Units Oral Daily  . folic acid  1 mg Oral Daily  . gabapentin  300 mg Oral TID  . HYDROmorphone   Intravenous Q4H  .  HYDROmorphone (DILAUDID) injection  1 mg Intravenous Once  . HYDROmorphone  6 mg Oral Q4H  . hydroxyurea  1,000 mg Oral Daily  . ketorolac  30 mg Intravenous Q6H  . mouth rinse  15 mL Mouth Rinse BID  . morphine  30 mg Oral Q12H  . rivaroxaban  20 mg Oral Q supper  . senna-docusate  1 tablet Oral BID  . torsemide  20 mg Oral Daily   Continuous Infusions:   Principal Problem:   Sickle cell pain crisis (HCC) Active Problems:   Pulmonary HTN   Cor pulmonale, chronic (HCC)   Chronic anticoagulation   Anemia of chronic disease   Acute on chronic respiratory failure with hypoxia (HCC)   Hx of pulmonary embolus   Essential hypertension   Troponin level  elevated   Prolonged Q-T interval on ECG   Acute pulmonary edema (HCC)   In excess of 35 minutes spent during this visit. Greater than 50% involved face to face contact with the patient for assessment, counseling and coordination of care.

## 2016-04-12 NOTE — Progress Notes (Signed)
Patient ID: [name], male   DOB: [birth year], 36 y.o.   MRN: [id number]  Blood gases reported, however both are arterial. Will attempt to obtain ABG in the morning.   [name],[name]

## 2016-04-13 ENCOUNTER — Inpatient Hospital Stay (HOSPITAL_COMMUNITY): Payer: Medicare Other

## 2016-04-13 LAB — CBC WITH DIFFERENTIAL/PLATELET
BASOS ABS: 0 10*3/uL (ref 0.0–0.1)
BASOS PCT: 0 %
Band Neutrophils: 0 %
Blasts: 0 %
EOS ABS: 0.8 10*3/uL — AB (ref 0.0–0.7)
Eosinophils Relative: 6 %
HEMATOCRIT: 21.1 % — AB (ref 39.0–52.0)
HEMOGLOBIN: 7.4 g/dL — AB (ref 13.0–17.0)
LYMPHS PCT: 7 %
Lymphs Abs: 1 10*3/uL (ref 0.7–4.0)
MCH: 30.3 pg (ref 26.0–34.0)
MCHC: 35.1 g/dL (ref 30.0–36.0)
MCV: 86.5 fL (ref 78.0–100.0)
MYELOCYTES: 0 %
Metamyelocytes Relative: 0 %
Monocytes Absolute: 1.9 10*3/uL — ABNORMAL HIGH (ref 0.1–1.0)
Monocytes Relative: 14 %
NEUTROS ABS: 10.1 10*3/uL — AB (ref 1.7–7.7)
NEUTROS PCT: 73 %
PROMYELOCYTES ABS: 0 %
Platelets: 503 10*3/uL — ABNORMAL HIGH (ref 150–400)
RBC: 2.44 MIL/uL — ABNORMAL LOW (ref 4.22–5.81)
RDW: 22.8 % — ABNORMAL HIGH (ref 11.5–15.5)
WBC: 13.8 10*3/uL — ABNORMAL HIGH (ref 4.0–10.5)
nRBC: 0 /100 WBC

## 2016-04-13 LAB — BASIC METABOLIC PANEL
ANION GAP: 5 (ref 5–15)
BUN: 27 mg/dL — ABNORMAL HIGH (ref 6–20)
CALCIUM: 8.7 mg/dL — AB (ref 8.9–10.3)
CO2: 24 mmol/L (ref 22–32)
CREATININE: 1.1 mg/dL (ref 0.61–1.24)
Chloride: 109 mmol/L (ref 101–111)
Glucose, Bld: 121 mg/dL — ABNORMAL HIGH (ref 65–99)
Potassium: 4.6 mmol/L (ref 3.5–5.1)
SODIUM: 138 mmol/L (ref 135–145)

## 2016-04-13 LAB — BLOOD GAS, ARTERIAL
Acid-base deficit: 1.1 mmol/L (ref 0.0–2.0)
Bicarbonate: 23.6 mmol/L (ref 20.0–28.0)
DRAWN BY: 331471
FIO2: 50
O2 Saturation: 93.9 %
PO2 ART: 83.9 mmHg (ref 83.0–108.0)
Patient temperature: 37
pCO2 arterial: 42.6 mmHg (ref 32.0–48.0)
pH, Arterial: 7.363 (ref 7.350–7.450)

## 2016-04-13 LAB — BRAIN NATRIURETIC PEPTIDE: B NATRIURETIC PEPTIDE 5: 364.2 pg/mL — AB (ref 0.0–100.0)

## 2016-04-13 LAB — TROPONIN I
TROPONIN I: 0.71 ng/mL — AB (ref <0.03)
TROPONIN I: 0.67 ng/mL — AB (ref <0.03)

## 2016-04-13 LAB — RETICULOCYTES
RBC.: 2.44 MIL/uL — AB (ref 4.22–5.81)
RETIC COUNT ABSOLUTE: 424.6 10*3/uL — AB (ref 19.0–186.0)
RETIC CT PCT: 17.4 % — AB (ref 0.4–3.1)

## 2016-04-13 MED ORDER — L-GLUTAMINE ORAL POWDER
15.0000 g | PACK | Freq: Two times a day (BID) | ORAL | Status: DC
  Administered 2016-04-13 – 2016-04-26 (×26): 15 g via ORAL
  Filled 2016-04-13 (×26): qty 3

## 2016-04-13 NOTE — Progress Notes (Signed)
SICKLE CELL SERVICE PROGRESS NOTE  [name] GYI:[id number] DOB: [birth year] DOA: 04/09/2016 PCP: [name], MD  Assessment/Plan: Principal Problem:   Sickle cell pain crisis ([place]) Active Problems:   Pulmonary HTN   Cor pulmonale, chronic (HCC)   Chronic anticoagulation   Anemia of chronic disease   Acute on chronic respiratory failure with hypoxia (HCC)   Hx of pulmonary embolus   Essential hypertension   Troponin level elevated   Prolonged Q-T interval on ECG   Acute pulmonary edema ([place])  1. Acute Pulmonary Edema with Hypoxia: Pt weaned down to 50% and ABG shows that saturations are co-relating and pAO2 >60. Will continue to wean patient and will do an ABG when saturations at 87% to ensure that pAo2 ic >60. 2. Acute on Chronic Respiratory Failure with Hypoxia: Pt continues to be without supplemental oxygen for prolonged periods of time and as a result is having worsening Oxygenation. CXR negative for any infiltrates and shows pulmonary edema. Continue diuresis and hold on any transfusions at this time as he is at his baseline Hb.  3. Hb SS with crisis: Continue PCA dose to 0.7 mg and clinician assisted doses on a PRN basis as the pain in his ribcage is severely affecting his ability to breath deeply and contributing to his hypoxia which in this patient is a risk for increased mortality.  Continue Toradol and add Endari. Pt has had a small increase in BUN and Cr but will continue an keep close monitor.  IVF decrease to [facility] because of pulmonary edema.  4. Elevated Troponin: Pt's Troponins remain mildly elevated likely due to demand ischemia associated with decreased oxygen saturations. Recent ECHO shows EF of 68% and Moderate to severe TR. He has no precordial CP so will hold on consulting Cardiology.  5. Leukocytosis: Improving. Likely related to crisis. No clinical evidence of infection.  6. Anemia of chronic disease:  Hb at baseline. 7. Chronic pain: Continue MS  Contin 8. Chronic Anticoagulation: Pt on Xarelto for recurrent PE's. Continue Xarelto. Pt reports that he has missed 1 dose of Xarelto. However I do not feel that this one dose has conferred an increased risk of PE. Nevertheless the treatment would be to continue Xarelto.  9. Benign HTN: BP will controlled with current medications except with pain when they become elevated with pain.  Code Status: Full Code Family Communication: N/A Disposition Plan: Not yet ready for discharge.   [name],[name]  Pager [phone]. If 7PM-7AM, please contact night-coverage.  04/13/2016, 3:53 PM  LOS: 3 days   Interim History: Pt reports that he was without his Oxygen for more than 6 hours on Wednesday and started having pain in his ribs on Thursday. This is a pattern that has been repeated several times in the last month. I have had multiple discussions about the impact of being without his Oxygen has on his Sickle Cell disease as well as on his Pulmonary Arterial Hypertension. He has not had a recent red cell exchange and does not have an appointment scheduled. Pt confirms that he has not been taking Jadenu for at least 1 month as his mother researched the medication online and advised him against taking it due to risk to kidneys. I have discussed risk and benefits in his individual case and he acknowledges that he is willing to resume Jadenu after discharge.   Pt appears more alert today. He reports pain as 7/10 and localized to his ribs bilaterally. He has used 43.4 mg on the  PCA in the last 24 hours with 63/63:demands/deliveries. Encouraged increased use of the PCA while awake.    Review of Records:  2-D ECHO ([facility]-01/06/16): Summary Technically adequate study Dilated right atrium and right ventricle with abnormal right ventricular systolic function consistent with acute strain on the right ventricle such as pulmonary embolus Other chambers appear structurally normal Normal LV systolic  function , flattened intraventricular septum consistent with pressure overload on the right side, normal LV systolic ejection fraction calculated at 68% Valves appear structurally normal mild mitral regurgitation Moderate to severe tricuspid regurgitation with moderate pulmonary hypertension right ventricular systolic pressure estimated of 60 mmHg No pericardial effusion The findings are consistent with acute/ recent pulmonary embolus Signature   Consultants:  None  Procedures:  None  Antibiotics:  None  Objective: Vitals:   04/13/16 1147 04/13/16 1200 04/13/16 1340 04/13/16 1544  BP:  117/80    Pulse:      Resp: 14 13 16 13   Temp:  97.5 F (36.4 C)    TempSrc:  Oral    SpO2: 94% 93% (!) 78% 94%  Weight:      Height:       Weight change:   Intake/Output Summary (Last 24 hours) at 04/13/16 1553 Last data filed at 04/13/16 1500  Gross per 24 hour  Intake              991 ml  Output             2225 ml  Net            -1234 ml    General: Alert, awake, oriented x3, in no apparent distress. More alert today.  HEENT: [place]/AT PEERL, EOMI, anicteric.  Heart: Regular rate and rhythm, II/VI systolic ejection murmurs at base, no rubs or gallops, PMI non-displaced, no heaves or thrills on palpation.  Lungs: Clear to auscultation except at b/l bases where there are mild crackles. No wheezing or rhonchi noted. No increased vocal fremitus resonant to percussion  Abdomen: Soft, nontender, nondistended, positive bowel sounds, no masses no hepatosplenomegaly noted.  Neuro: No focal neurological deficits noted cranial nerves II through XII grossly intact. . Strength at functional baseline in bilateral upper and lower extremities. Musculoskeletal: No warmth swelling or erythema around joints, no spinal tenderness noted. Psychiatric: Patient alert and oriented x3, good insight and cognition, good recent to remote recall.    Data Reviewed: Basic Metabolic Panel:  Recent Labs Lab  04/09/16 0436 04/10/16 0011 04/10/16 0646 04/11/16 1250 04/12/16 0432 04/13/16 1015  NA 136 135  --  136 138 138  K 3.8 3.6  --  4.3 4.1 4.6  CL 109 109  --  107 109 109  CO2 20* 22  --  21* 23 24  GLUCOSE 107* 109*  --  118* 114* 121*  BUN 12 10  --  12 19 27*  CREATININE 0.90 0.92  --  0.84 1.07 1.10  CALCIUM 9.0 8.9  --  9.1 8.7* 8.7*  MG  --   --  1.8  --   --   --    Liver Function Tests:  Recent Labs Lab 04/07/16 1108 04/10/16 0011  AST 36 35  ALT 24 20  ALKPHOS 106 96  BILITOT 4.7* 4.8*  PROT 8.0 7.7  ALBUMIN 4.3 4.2   No results for input(s): LIPASE, AMYLASE in the last 168 hours. No results for input(s): AMMONIA in the last 168 hours. CBC:  Recent Labs Lab 04/09/16 0436  04/10/16 0011 04/11/16 0351 04/12/16 0432 04/13/16 1015  WBC 17.4* 23.5* 21.7* 18.1* 13.8*  NEUTROABS 14.8* 17.8* 15.6* 13.2* 10.1*  HGB 8.2* 8.0* 7.6* 7.7* 7.4*  HCT 23.6* 22.2* 21.8* 21.5* 21.1*  MCV 90.4 87.1 89.3 88.1 86.5  PLT 657* 633* 551* 506* 503*   Cardiac Enzymes:  Recent Labs Lab 04/10/16 1213 04/10/16 1850 04/12/16 1210 04/12/16 1740 04/12/16 2350  TROPONINI 0.90* 1.23* 0.66* 0.60* 0.67*   BNP (last 3 results)  Recent Labs  04/02/16 0455 04/11/16 0351 04/13/16 1015  BNP 559.5* 762.8* 364.2*    ProBNP (last 3 results) No results for input(s): PROBNP in the last 8760 hours.  CBG: No results for input(s): GLUCAP in the last 168 hours.  Recent Results (from the past 240 hour(s))  MRSA PCR Screening     Status: None   Collection Time: 04/10/16  9:12 PM  Result Value Ref Range Status   MRSA by PCR NEGATIVE NEGATIVE Final    Comment:        The GeneXpert MRSA Assay (FDA approved for NASAL specimens only), is one component of a comprehensive MRSA colonization surveillance program. It is not intended to diagnose MRSA infection nor to guide or monitor treatment for MRSA infections.      Studies: Dg Chest 2 View  Result Date: 04/13/2016 CLINICAL  DATA:  History of sickle cell disease. Patient admitted 04/10/2016 with chest pain. Hypoxia and pulmonary edema. EXAM: CHEST  2 VIEW COMPARISON:  PA and lateral chest 04/09/2016 and 01/31/2016. Single-view of the chest 04/11/2016. FINDINGS: Marked cardiomegaly is again seen. There is mild interstitial edema which appears unchanged since yesterday's examination. No pneumothorax or pleural effusion. No consolidative process. Port-A-Cath is in place. Avascular necrosis left humeral head is noted. IMPRESSION: No change in mild interstitial edema in patient with marked cardiomegaly. Electronically Signed   By: Inge Rise M.D.   On: 04/13/2016 10:54   Dg Chest 2 View  Result Date: 04/10/2016 CLINICAL DATA:  Bilateral chest wall pain. Sickle cell disease. Pain began yesterday. EXAM: CHEST  2 VIEW COMPARISON:  03/27/2016 FINDINGS: Left subclavian port extends to the low SVC. Unchanged cardiomegaly. No airspace consolidation. No pleural effusions. Pulmonary vasculature is normal. Hilar and mediastinal contours are unremarkable and unchanged. IMPRESSION: Unchanged cardiomegaly.  No consolidation or effusion. Electronically Signed   By: Andreas Newport M.D.   On: 04/10/2016 00:50   Dg Chest 2 View  Result Date: 03/27/2016 CLINICAL DATA:  Left chest pain.  Sickle cell anemia EXAM: CHEST  2 VIEW COMPARISON:  03/22/2016 FINDINGS: Cardiac enlargement with vascular congestion. Negative for edema or effusion. Negative for pneumonia. Port-A-Cath tip in the SVC. IMPRESSION: Cardiac enlargement with pulmonary vascular congestion. Negative for pneumonia or edema. Electronically Signed   By: Franchot Gallo M.D.   On: 03/27/2016 16:02   Dg Chest 2 View  Result Date: 03/22/2016 CLINICAL DATA:  Chest/bilateral rib pain since last night. Sickle cell crisis. EXAM: CHEST  2 VIEW COMPARISON:  Chest CT 03/13/2016 and radiographs 03/11/2016 FINDINGS: Left subclavian Port-A-Cath remains in place, with tip overlying the lower  SVC. Cardiac silhouette remains moderately enlarged, unchanged. Mild diffuse chronic interstitial accentuation is unchanged. Focal scarring is noted in the right mid lung. There is no evidence of acute airspace consolidation, pulmonary edema, pleural effusion, or pneumothorax. No acute osseous abnormality is seen. IMPRESSION: No active cardiopulmonary disease. Electronically Signed   By: Logan Bores M.D.   On: 03/22/2016 08:18   Dg Chest [facility]  Result Date: 04/11/2016 CLINICAL DATA:  Initial evaluation for sickle cell pain crisis. Worsened shortness of breath. EXAM: PORTABLE CHEST 1 VIEW COMPARISON:  Prior radiograph from 04/09/2016. FINDINGS: Left-sided Port-A-Cath in place, stable. Severe cardiomegaly not significantly changed. Mediastinal silhouette within normal limits. Lungs mildly hypoinflated. There is slightly worsened pulmonary vascular congestion as compared to previous. Slightly worsened right infrahilar opacity, which may reflect developing infiltrate and/or atelectasis. More linear opacities at the bilateral lung bases most consistent with atelectasis. No pleural effusion. No pneumothorax. No acute osseous abnormality. IMPRESSION: 1. Severe cardiomegaly with slightly worsened diffuse pulmonary vascular congestion as compared to 04/09/16, which may reflect developing pulmonary edema. 2. Slightly patchy right infrahilar opacity, which may reflect congestion and/or developing infiltrate. 3. Mildly increased superimposed bibasilar atelectasis. Electronically Signed   By: Jeannine Boga M.D.   On: 04/11/2016 06:10    Scheduled Meds: . ambrisentan  5 mg Oral Daily  . aspirin  81 mg Oral Daily  . carvedilol  3.125 mg Oral BID WC  . Chlorhexidine Gluconate Cloth  6 each Topical Q0600  . cholecalciferol  2,000 Units Oral Daily  . folic acid  1 mg Oral Daily  . gabapentin  300 mg Oral TID  . HYDROmorphone   Intravenous Q4H  . HYDROmorphone  6 mg Oral Q4H  . hydroxyurea  1,000 mg Oral  Daily  . ketorolac  30 mg Intravenous Q6H  . mouth rinse  15 mL Mouth Rinse BID  . morphine  30 mg Oral Q12H  . rivaroxaban  20 mg Oral Q supper  . senna-docusate  1 tablet Oral BID  . torsemide  20 mg Oral Daily   Continuous Infusions:   Principal Problem:   Sickle cell pain crisis (HCC) Active Problems:   Pulmonary HTN   Cor pulmonale, chronic (HCC)   Chronic anticoagulation   Anemia of chronic disease   Acute on chronic respiratory failure with hypoxia (HCC)   Hx of pulmonary embolus   Essential hypertension   Troponin level elevated   Prolonged Q-T interval on ECG   Acute pulmonary edema (HCC)   In excess of 35 minutes spent during this visit. Greater than 50% involved face to face contact with the patient for assessment, counseling and coordination of care.

## 2016-04-13 NOTE — Progress Notes (Signed)
Patient pulls  venturimask off at times.O2 sat decreases to low 80s.Patient educated.

## 2016-04-14 LAB — BASIC METABOLIC PANEL
Anion gap: 6 (ref 5–15)
BUN: 30 mg/dL — AB (ref 6–20)
CALCIUM: 8.6 mg/dL — AB (ref 8.9–10.3)
CHLORIDE: 110 mmol/L (ref 101–111)
CO2: 26 mmol/L (ref 22–32)
CREATININE: 1.2 mg/dL (ref 0.61–1.24)
GFR calc non Af Amer: >60 mL/min (ref >60)
GLUCOSE: 124 mg/dL — AB (ref 65–99)
Potassium: 4.4 mmol/L (ref 3.5–5.1)
Sodium: 142 mmol/L (ref 135–145)

## 2016-04-14 LAB — BRAIN NATRIURETIC PEPTIDE: B Natriuretic Peptide: 315.7 pg/mL — ABNORMAL HIGH (ref 0.0–100.0)

## 2016-04-14 NOTE — Progress Notes (Signed)
Patients  sats dropped to 70's when he was eating his dinner. Educated him about keeping his venturi mask on in between bites. Now resting SaO2 is 94%.

## 2016-04-14 NOTE — Progress Notes (Signed)
Patient ID: [name], male   DOB: [birth year], 36 y.o.   MRN: [id number] Subjective:  Patient continues to desaturate without non-rebreather oxygen. Still on 50% oxygen, now saturating above 95% consistently. Patient has no new complaint except for pain, he is requesting more pain medication, but he was educated on the need to focus on his respiratory status at the moment. He remain calm and comfortable with no objective evidence of pain distress that needs more pain meds. No fever. Denies chest pain.   Objective:  Vital signs in last 24 hours:  Vitals:   04/14/16 1155 04/14/16 1200 04/14/16 1318 04/14/16 1600  BP:  131/86  (!) 121/58  Pulse:      Resp: 13 13 13 13   Temp:    97.6 F (36.4 C)  TempSrc:    Oral  SpO2: 95% 96% 96% 96%  Weight:      Height:       Intake/Output from previous day:   Intake/Output Summary (Last 24 hours) at 04/14/16 1750 Last data filed at 04/14/16 1205  Gross per 24 hour  Intake              960 ml  Output             2000 ml  Net            -1040 ml   Physical Exam: General: Alert, awake, oriented x3, in no acute distress.  HEENT: [place]/AT PEERL, EOMI Neck: Trachea midline,  no masses, no thyromegal,y no JVD, no carotid bruit OROPHARYNX:  Moist, No exudate/ erythema/lesions.  Heart: Regular rate and rhythm, without murmurs, rubs, gallops, PMI non-displaced, no heaves or thrills on palpation.  Lungs: Clear to auscultation, no wheezing or rhonchi noted. No increased vocal fremitus resonant to percussion  Abdomen: Soft, nontender, nondistended, positive bowel sounds, no masses no hepatosplenomegaly noted..  Neuro: No focal neurological deficits noted cranial nerves II through XII grossly intact. DTRs 2+ bilaterally upper and lower extremities. Strength 5 out of 5 in bilateral upper and lower extremities. Musculoskeletal: No warm swelling or erythema around joints, no spinal tenderness noted. Psychiatric: Patient alert and oriented x3, good insight  and cognition, good recent to remote recall. Lymph node survey: No cervical axillary or inguinal lymphadenopathy noted.  Lab Results:  Basic Metabolic Panel:    Component Value Date/Time   NA 142 04/14/2016 0500   K 4.4 04/14/2016 0500   CL 110 04/14/2016 0500   CO2 26 04/14/2016 0500   BUN 30 (H) 04/14/2016 0500   CREATININE 1.20 04/14/2016 0500   CREATININE 0.64 08/22/2013 1235   GLUCOSE 124 (H) 04/14/2016 0500   CALCIUM 8.6 (L) 04/14/2016 0500   CBC:    Component Value Date/Time   WBC 13.8 (H) 04/13/2016 1015   HGB 7.4 (L) 04/13/2016 1015   HCT 21.1 (L) 04/13/2016 1015   PLT 503 (H) 04/13/2016 1015   MCV 86.5 04/13/2016 1015   NEUTROABS 10.1 (H) 04/13/2016 1015   LYMPHSABS 1.0 04/13/2016 1015   MONOABS 1.9 (H) 04/13/2016 1015   EOSABS 0.8 (H) 04/13/2016 1015   BASOSABS 0.0 04/13/2016 1015    Recent Results (from the past 240 hour(s))  MRSA PCR Screening     Status: None   Collection Time: 04/10/16  9:12 PM  Result Value Ref Range Status   MRSA by PCR NEGATIVE NEGATIVE Final    Comment:        The GeneXpert MRSA Assay (FDA approved for NASAL specimens only), is  one component of a comprehensive MRSA colonization surveillance program. It is not intended to diagnose MRSA infection nor to guide or monitor treatment for MRSA infections.     Studies/Results: Dg Chest 2 View  Result Date: 04/13/2016 CLINICAL DATA:  History of sickle cell disease. Patient admitted 04/10/2016 with chest pain. Hypoxia and pulmonary edema. EXAM: CHEST  2 VIEW COMPARISON:  PA and lateral chest 04/09/2016 and 01/31/2016. Single-view of the chest 04/11/2016. FINDINGS: Marked cardiomegaly is again seen. There is mild interstitial edema which appears unchanged since yesterday's examination. No pneumothorax or pleural effusion. No consolidative process. Port-A-Cath is in place. Avascular necrosis left humeral head is noted. IMPRESSION: No change in mild interstitial edema in patient with marked  cardiomegaly. Electronically Signed   By: Inge Rise M.D.   On: 04/13/2016 10:54    Medications: Scheduled Meds: . ambrisentan  5 mg Oral Daily  . aspirin  81 mg Oral Daily  . carvedilol  3.125 mg Oral BID WC  . Chlorhexidine Gluconate Cloth  6 each Topical Q0600  . cholecalciferol  2,000 Units Oral Daily  . folic acid  1 mg Oral Daily  . gabapentin  300 mg Oral TID  . HYDROmorphone   Intravenous Q4H  . HYDROmorphone  6 mg Oral Q4H  . hydroxyurea  1,000 mg Oral Daily  . ketorolac  30 mg Intravenous Q6H  . L-glutamine  15 g Oral BID  . mouth rinse  15 mL Mouth Rinse BID  . morphine  30 mg Oral Q12H  . rivaroxaban  20 mg Oral Q supper  . senna-docusate  1 tablet Oral BID  . torsemide  20 mg Oral Daily   Continuous Infusions: PRN Meds:.diphenhydrAMINE **OR** diphenhydrAMINE (BENADRYL) IVPB(SICKLE CELL ONLY), naloxone **AND** sodium chloride flush, polyethylene glycol, prochlorperazine, sodium chloride flush, zolpidem  Consultants:  None  Procedures:  None  Antibiotics:  None  Assessment/Plan: Principal Problem:   Sickle cell pain crisis ([place]) Active Problems:   Hx of pulmonary embolus   Pulmonary HTN   Chronic anticoagulation   Essential hypertension   Anemia of chronic disease   Cor pulmonale, chronic (HCC)   Troponin level elevated   Prolonged Q-T interval on ECG   Acute on chronic respiratory failure with hypoxia (HCC)   Acute pulmonary edema (HCC)  1. Acute Pulmonary Edema with Hypoxia: Pt weaned down to 50% and ABG shows that saturations are co-relating and pAO2 >60. Will continue to wean patient and will do an ABG when saturations at 87% to ensure that PAo2 is >60. 2. Acute on Chronic Respiratory Failure with Hypoxia: Pt continues to be without supplemental oxygen for prolonged periods of time and as a result is having worsening Oxygenation. CXR negative for any infiltrates and shows pulmonary edema. Continue diuresis and hold on any transfusions at this  time as he is at his baseline Hb.  3. Hb SS with crisis: Continue PCA dose to 0.7 mg and clinician assisted doses on a PRN basis as the pain in his ribcage is severely affecting his ability to breath deeply and contributing to his hypoxia which in this patient is a risk for increased mortality.  Continue Toradol and add Endari. Pt has had a small increase in BUN and Cr but will continue an keep close monitor.  [facility]F decrease to [facility] because of pulmonary edema.  4. Elevated Troponin: Pt's Troponins remain mildly elevated likely due to demand ischemia associated with decreased oxygen saturations. Recent ECHO shows EF of 68% and Moderate to severe  TR. He has no precordial CP so will hold on consulting Cardiology.  5. Leukocytosis: Improving. Likely related to crisis. No clinical evidence of infection.  6. Anemia of chronic disease:  Hb at baseline. 7. Chronic pain: Continue MS Contin 8. Chronic Anticoagulation: Pt on Xarelto for recurrent PE's. Continue Xarelto.   Code Status: Full Code Family Communication: N/A Disposition Plan: Not yet ready for discharge  Demarie Hyneman  If 7PM-7AM, please contact night-coverage.  04/14/2016, 5:50 PM  LOS: 4 days

## 2016-04-14 NOTE — Progress Notes (Signed)
Patient is more alert and able to follow commands, RN put him back to venturi mask @ 50%. o2sat 87-90%. Will continue to monitor.

## 2016-04-14 NOTE — Progress Notes (Signed)
RN put back patient to non re breather mask, Pt is so drowsy and unable maintain o2sat at 90% on venturi mask. Patient O2sat 95-99% on non re breather. Will continue to monitor.

## 2016-04-15 NOTE — Progress Notes (Signed)
Date:  April 15, 2016 Chart reviewed for concurrent status and case management needs. Will continue to follow patient progress. Discharge Planning: following for needs Expected discharge date: [id number] Don Giarrusso, BSN, RN3, CCM   [phone] 

## 2016-04-15 NOTE — Progress Notes (Signed)
Pt. continues to be non-compliant with keeping his venturi-mask on. Patient has been eating since the beginning of RN's shift. On educating patient about keeping his Venturi mask on and taking a break from eating, pt. gets agitated with the RN. O2 sats stay in the 70's when mask is off and picks up and remains at 91-92 % with the mask on.

## 2016-04-16 LAB — COMPREHENSIVE METABOLIC PANEL
ALK PHOS: 89 U/L (ref 38–126)
ALT: 27 U/L (ref 17–63)
AST: 44 U/L — ABNORMAL HIGH (ref 15–41)
Albumin: 3.1 g/dL — ABNORMAL LOW (ref 3.5–5.0)
Anion gap: 5 (ref 5–15)
BILIRUBIN TOTAL: 2.7 mg/dL — AB (ref 0.3–1.2)
BUN: 23 mg/dL — ABNORMAL HIGH (ref 6–20)
CO2: 25 mmol/L (ref 22–32)
Calcium: 7.6 mg/dL — ABNORMAL LOW (ref 8.9–10.3)
Chloride: 108 mmol/L (ref 101–111)
Creatinine, Ser: 1.36 mg/dL — ABNORMAL HIGH (ref 0.61–1.24)
Glucose, Bld: 288 mg/dL — ABNORMAL HIGH (ref 65–99)
Potassium: 3.5 mmol/L (ref 3.5–5.1)
Sodium: 138 mmol/L (ref 135–145)
Total Protein: 6.1 g/dL — ABNORMAL LOW (ref 6.5–8.1)

## 2016-04-16 LAB — CBC WITH DIFFERENTIAL/PLATELET
BASOS PCT: 1 %
Basophils Absolute: 0.1 10*3/uL (ref 0.0–0.1)
EOS PCT: 7 %
Eosinophils Absolute: 0.6 10*3/uL (ref 0.0–0.7)
HEMATOCRIT: 16.1 % — AB (ref 39.0–52.0)
HEMOGLOBIN: 5.7 g/dL — AB (ref 13.0–17.0)
LYMPHS PCT: 21 %
Lymphs Abs: 1.8 10*3/uL (ref 0.7–4.0)
MCH: 30.6 pg (ref 26.0–34.0)
MCHC: 35.4 g/dL (ref 30.0–36.0)
MCV: 86.6 fL (ref 78.0–100.0)
Monocytes Absolute: 1.3 10*3/uL — ABNORMAL HIGH (ref 0.1–1.0)
Monocytes Relative: 15 %
NEUTROS PCT: 56 %
Neutro Abs: 5 10*3/uL (ref 1.7–7.7)
Platelets: 420 10*3/uL — ABNORMAL HIGH (ref 150–400)
RBC: 1.86 MIL/uL — ABNORMAL LOW (ref 4.22–5.81)
RDW: 22.1 % — ABNORMAL HIGH (ref 11.5–15.5)
WBC: 8.8 10*3/uL (ref 4.0–10.5)

## 2016-04-16 LAB — BLOOD GAS, ARTERIAL
Acid-Base Excess: 1.9 mmol/L (ref 0.0–2.0)
Bicarbonate: 26.6 mmol/L (ref 20.0–28.0)
DRAWN BY: 308601
FIO2: 50
O2 CONTENT: 12 L/min
O2 Saturation: 95.9 %
Patient temperature: 99.1
pCO2 arterial: 46.6 mmHg (ref 32.0–48.0)
pH, Arterial: 7.377 (ref 7.350–7.450)
pO2, Arterial: 94.2 mmHg (ref 83.0–108.0)

## 2016-04-16 LAB — PREPARE RBC (CROSSMATCH)

## 2016-04-16 MED ORDER — SODIUM CHLORIDE 0.9 % IV SOLN
Freq: Once | INTRAVENOUS | Status: AC
  Administered 2016-04-16: 500 mL via INTRAVENOUS

## 2016-04-16 MED ORDER — FUROSEMIDE 10 MG/ML IJ SOLN
40.0000 mg | Freq: Once | INTRAMUSCULAR | Status: AC
  Administered 2016-04-16: 40 mg via INTRAVENOUS
  Filled 2016-04-16: qty 4

## 2016-04-16 MED ORDER — HYDROMORPHONE 1 MG/ML IV SOLN
INTRAVENOUS | Status: DC
  Administered 2016-04-16: 1 mg via INTRAVENOUS
  Administered 2016-04-16: 7 mg via INTRAVENOUS
  Administered 2016-04-16: 4.9 mg via INTRAVENOUS
  Administered 2016-04-16: 5.9 mg via INTRAVENOUS
  Administered 2016-04-16: 8.4 mg via INTRAVENOUS
  Administered 2016-04-17: 4.9 mg via INTRAVENOUS
  Administered 2016-04-17: 11.2 mg via INTRAVENOUS
  Administered 2016-04-17: 4.9 mg via INTRAVENOUS
  Administered 2016-04-17 – 2016-04-18 (×3): via INTRAVENOUS
  Administered 2016-04-18: 7.7 mg via INTRAVENOUS
  Administered 2016-04-18: 0 mg via INTRAVENOUS
  Administered 2016-04-18: 11.2 mg via INTRAVENOUS
  Administered 2016-04-19: 6.3 mg via INTRAVENOUS
  Administered 2016-04-19: 4.2 mg via INTRAVENOUS
  Filled 2016-04-16 (×4): qty 25

## 2016-04-16 NOTE — Progress Notes (Signed)
Subjective: A 36 yo admitted with sickle cell crisis as well as hypoxemic respiratory failure. He is still on 50% VM. Pain at 8/10 all over but drowsy. No fever, no chills, no NVD. Patient has known pulmonary edema as well as pulmonary HTN on home oxygen. Hemoglobin today is down to 5.7g. No significant SOB.  Objective: Vital signs in last 24 hours: Temp:  [97.5 F (36.4 C)-99.1 F (37.3 C)] 98.2 F (36.8 C) (03/17 1755) Pulse Rate:  [73-87] 87 (03/17 1755) Resp:  [11-23] 13 (03/17 1800) BP: (94-124)/(54-79) 95/62 (03/17 1800) SpO2:  [81 %-100 %] 97 % (03/17 1800) FiO2 (%):  [50 %] 50 % (03/17 1731) Weight change:  Last BM Date: 04/14/16  Intake/Output from previous day: 03/16 0701 - 03/17 0700 In: 120  Out: 3650 [Urine:3250; Stool:400] Intake/Output this shift: Total I/O In: 145 [I.V.:145] Out: 3850 [Urine:3850]  General appearance: alert, cooperative and no distress Back: symmetric, no curvature. ROM normal. No CVA tenderness. Resp: clear to auscultation bilaterally Chest wall: no tenderness Cardio: regular rate and rhythm, S1, S2 normal, no murmur, click, rub or gallop GI: soft, non-tender; bowel sounds normal; no masses,  no organomegaly Extremities: extremities normal, atraumatic, no cyanosis or edema Pulses: 2+ and symmetric Skin: Skin color, texture, turgor normal. No rashes or lesions Neurologic: Grossly normal  Lab Results:  Recent Labs  04/16/16 0810  WBC 8.8  HGB 5.7*  HCT 16.1*  PLT 420*   BMET  Recent Labs  04/14/16 0500 04/16/16 0810  NA 142 138  K 4.4 3.5  CL 110 108  CO2 26 25  GLUCOSE 124* 288*  BUN 30* 23*  CREATININE 1.20 1.36*  CALCIUM 8.6* 7.6*    Studies/Results: No results found.  Medications: I have reviewed the patient's current medications.  Assessment/Plan: A 36 yo admitted with Sickle cell crisis with acute on chronic respiratory failure. Principal Problem:   Sickle cell pain crisis ([place]) Active Problems:   Hx of  pulmonary embolus   Pulmonary HTN   Chronic anticoagulation   Essential hypertension   Anemia of chronic disease   Cor pulmonale, chronic (HCC)   Troponin level elevated   Prolonged Q-T interval on ECG   Acute on chronic respiratory failure with hypoxia (HCC)   Acute pulmonary edema (HCC)  1. Acute Pulmonary Edema with Hypoxia:  Patient will get transfused 2 units PRBC to improve oxygen carrying capacity. We will repeat ABG in the morning and attempt to titrate oxygen down. 2. Acute on Chronic Respiratory Failure with Hypoxia:CXR negative for any infiltrates and shows pulmonary edema. Continue diuresis and monitor oxygen saturations. 3. Hb SS with crisis:Continue PCA dose to 0.7 mg and DC clinician assisted doses PRN basis. Continue Toradol and Endari. 4. Elevated Troponin: Patient stable. No evidence of decompensation. 5. Leukocytosis: Improving. Likely related to crisis. No clinical evidence of infection.  6. Anemia of chronic disease: Hb at baseline. 7. Chronic pain: Continue MS Contin 8. Chronic Anticoagulation: Pt on Xarelto for recurrent PE's. Continue Xarelto.   LOS: 6 days   Shakisha Abend,[name] 04/16/2016, 6:57 PM

## 2016-04-16 NOTE — Progress Notes (Signed)
PHARMACY BRIEF NOTE: HYDROXYUREA   By [facility] policy, hydroxyurea is automatically held when any of the following laboratory values occur:  ANC < 2 K  Pltc < 80K in sickle-cell patients; < 100K in other patients  Hgb <= 6 (5.7)  in sickle-cell patients; < 8 in other patients  Reticulocytes < 80K when Hgb < 9  Hydroxyurea has been held (discontinued from profile) per policy.   Dia Sitter, PharmD, BCPS 04/16/2016 8:55 AM

## 2016-04-16 NOTE — Progress Notes (Signed)
Subjective:  Patient doing much better today, although he still complaining of ongoing pain. He has no fever, denies chest pain. Remains on 50% of oxygen by Non-Rebreather, Pulse Oxy is above 95%.   Objective:  Vital signs in last 24 hours:  Vitals:   04/16/16 0900 04/16/16 1010 04/16/16 1134 04/16/16 1227  BP:  (!) 109/59    Pulse:  73    Resp: 14   14  Temp:   97.7 F (36.5 C)   TempSrc:   Axillary   SpO2: 98%   98%  Weight:      Height:        Intake/Output from previous day:   Intake/Output Summary (Last 24 hours) at 04/16/16 1231 Last data filed at 04/16/16 1135  Gross per 24 hour  Intake              120 ml  Output             3350 ml  Net            -3230 ml   Physical Exam: General: Alert, awake, oriented x3, in no acute distress.  HEENT: [place]/AT PEERL, EOMI Neck: Trachea midline,  no masses, no thyromegal,y no JVD, no carotid bruit OROPHARYNX:  Moist, No exudate/ erythema/lesions.  Heart: Regular rate and rhythm, without murmurs, rubs, gallops, PMI non-displaced, no heaves or thrills on palpation.  Lungs: Clear to auscultation, no wheezing or rhonchi noted. No increased vocal fremitus resonant to percussion  Abdomen: Soft, nontender, nondistended, positive bowel sounds, no masses no hepatosplenomegaly noted..  Neuro: No focal neurological deficits noted cranial nerves II through XII grossly intact. DTRs 2+ bilaterally upper and lower extremities. Strength 5 out of 5 in bilateral upper and lower extremities. Musculoskeletal: No warm swelling or erythema around joints, no spinal tenderness noted. Psychiatric: Patient alert and oriented x3, good insight and cognition, good recent to remote recall. Lymph node survey: No cervical axillary or inguinal lymphadenopathy noted.  Lab Results:  Basic Metabolic Panel:    Component Value Date/Time   NA 138 04/16/2016 0810   K 3.5 04/16/2016 0810   CL 108 04/16/2016 0810   CO2 25 04/16/2016 0810   BUN 23 (H) 04/16/2016  0810   CREATININE 1.36 (H) 04/16/2016 0810   CREATININE 0.64 08/22/2013 1235   GLUCOSE 288 (H) 04/16/2016 0810   CALCIUM 7.6 (L) 04/16/2016 0810   CBC:    Component Value Date/Time   WBC 8.8 04/16/2016 0810   HGB 5.7 (LL) 04/16/2016 0810   HCT 16.1 (L) 04/16/2016 0810   PLT 420 (H) 04/16/2016 0810   MCV 86.6 04/16/2016 0810   NEUTROABS 5.0 04/16/2016 0810   LYMPHSABS 1.8 04/16/2016 0810   MONOABS 1.3 (H) 04/16/2016 0810   EOSABS 0.6 04/16/2016 0810   BASOSABS 0.1 04/16/2016 0810    Recent Results (from the past 240 hour(s))  MRSA PCR Screening     Status: None   Collection Time: 04/10/16  9:12 PM  Result Value Ref Range Status   MRSA by PCR NEGATIVE NEGATIVE Final    Comment:        The GeneXpert MRSA Assay (FDA approved for NASAL specimens only), is one component of a comprehensive MRSA colonization surveillance program. It is not intended to diagnose MRSA infection nor to guide or monitor treatment for MRSA infections.     Studies/Results: No results found.  Medications: Scheduled Meds: . sodium chloride   Intravenous Once  . ambrisentan  5 mg Oral Daily  .  aspirin  81 mg Oral Daily  . carvedilol  3.125 mg Oral BID WC  . Chlorhexidine Gluconate Cloth  6 each Topical Q0600  . cholecalciferol  2,000 Units Oral Daily  . folic acid  1 mg Oral Daily  . furosemide  40 mg Intravenous Once  . gabapentin  300 mg Oral TID  . HYDROmorphone   Intravenous Q4H  . HYDROmorphone  6 mg Oral Q4H  . L-glutamine  15 g Oral BID  . mouth rinse  15 mL Mouth Rinse BID  . morphine  30 mg Oral Q12H  . rivaroxaban  20 mg Oral Q supper  . senna-docusate  1 tablet Oral BID  . 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 Acute Pulmonary Edema with Hypoxia: Pulse oxy remained above 95%, will transition to nasal cannula, titrate from 4L of oxygen to maintain pulse oxy above 87%.  Will  repeat ABG in the morning. 2. Acute on Chronic Respiratory Failure with Hypoxia: CXR negative for any infiltrates and shows pulmonary edema. Continue diuresis and hold on any transfusions at this time as he is at his baseline Hb.  3. Hb SS with crisis: Continue PCA dose to 0.7 mg and clinician assisted doses PRN basis. Continue Toradol and Endari. 4. Elevated Troponin: Pt's Troponins remain mildly elevated likely due to demand ischemia associated with decreased oxygen saturations. Recent ECHO shows EF of 68% and Moderate to severe TR.  5. Leukocytosis: Improving. Likely related to crisis. No clinical evidence of infection.  6. Anemia of chronic disease: Hb at baseline. 7. Chronic pain: Continue MS Contin 8. Chronic Anticoagulation: Pt on Xarelto for recurrent PE's. Continue Xarelto.  Code Status: Full Code Family Communication: N/A Disposition Plan: Not yet ready for discharge  [name]  If 7PM-7AM, please contact night-coverage.

## 2016-04-17 ENCOUNTER — Inpatient Hospital Stay (HOSPITAL_COMMUNITY): Payer: Medicare Other

## 2016-04-17 LAB — CBC WITH DIFFERENTIAL/PLATELET
BASOS ABS: 0.1 10*3/uL (ref 0.0–0.1)
BASOS PCT: 2 %
Eosinophils Absolute: 0.6 10*3/uL (ref 0.0–0.7)
Eosinophils Relative: 7 %
HCT: 23.1 % — ABNORMAL LOW (ref 39.0–52.0)
Hemoglobin: 7.9 g/dL — ABNORMAL LOW (ref 13.0–17.0)
Lymphocytes Relative: 24 %
Lymphs Abs: 2.1 10*3/uL (ref 0.7–4.0)
MCH: 29.3 pg (ref 26.0–34.0)
MCHC: 34.2 g/dL (ref 30.0–36.0)
MCV: 85.6 fL (ref 78.0–100.0)
MONO ABS: 1.7 10*3/uL — AB (ref 0.1–1.0)
Monocytes Relative: 19 %
Neutro Abs: 4.4 10*3/uL (ref 1.7–7.7)
Neutrophils Relative %: 49 %
Platelets: 491 10*3/uL — ABNORMAL HIGH (ref 150–400)
RBC: 2.7 MIL/uL — ABNORMAL LOW (ref 4.22–5.81)
RDW: 20.8 % — AB (ref 11.5–15.5)
WBC: 9 10*3/uL (ref 4.0–10.5)

## 2016-04-17 NOTE — Progress Notes (Signed)
Subjective: Patient appears to have improved today.  Hemoglobin is more than 7g after transfusion. He has been on 50% Ventimask oxygen.  Still has pain at 9/10 in his back and legs. He has been stable and other vitals are equally stable. No significant SOB.  Objective: Vital signs in last 24 hours: Temp:  [97.5 F (36.4 C)-99 F (37.2 C)] 98.5 F (36.9 C) (03/18 1204) Pulse Rate:  [78-87] 84 (03/18 0859) Resp:  [13-20] 16 (03/18 1415) BP: (94-135)/(47-84) 104/72 (03/18 0859) SpO2:  [86 %-98 %] 96 % (03/18 1415) FiO2 (%):  [40 %-50 %] 40 % (03/18 1100) Weight change:  Last BM Date: 04/14/16  Intake/Output from previous day: 03/17 0701 - 03/18 0700 In: 1147.5 [I.V.:145; Blood:882.5] Out: 4800 [Urine:4800] Intake/Output this shift: Total I/O In: -  Out: 1300 [Urine:1300]  General appearance: alert, cooperative and no distress Back: symmetric, no curvature. ROM normal. No CVA tenderness. Resp: clear to auscultation bilaterally Chest wall: no tenderness Cardio: regular rate and rhythm, S1, S2 normal, no murmur, click, rub or gallop GI: soft, non-tender; bowel sounds normal; no masses,  no organomegaly Extremities: extremities normal, atraumatic, no cyanosis or edema Pulses: 2+ and symmetric Skin: Skin color, texture, turgor normal. No rashes or lesions Neurologic: Grossly normal  Lab Results:  Recent Labs  04/16/16 0810 04/17/16 0845  WBC 8.8 9.0  HGB 5.7* 7.9*  HCT 16.1* 23.1*  PLT 420* 491*   BMET  Recent Labs  04/16/16 0810  NA 138  K 3.5  CL 108  CO2 25  GLUCOSE 288*  BUN 23*  CREATININE 1.36*  CALCIUM 7.6*    Studies/Results: Dg Chest Port 1 View  Result Date: 04/17/2016 CLINICAL DATA:  Lethargy.  Shortness of breath. EXAM: PORTABLE CHEST 1 VIEW COMPARISON:  None. FINDINGS: Stable enlarged cardiac silhouette. Increased density at the left lateral lung base. Interval mild patchy and linear opacity at the right lung base. Left subclavian porta catheter  tip in the right atrium. No acute bony abnormality. 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 IMPRESSION: 1. Interval atelectasis or pneumonia at the left lateral lung base. 2. Interval mild atelectasis and possible pneumonia at the right lung base. 3. Stable cardiomegaly. 4. Left subclavian porta catheter tip in the right atrium. This could be retracted 4.5 cm to place it in the superior vena cava. Electronically Signed   By: Claudie Revering M.D.   On: 04/17/2016 09:06    Medications: I have reviewed the patient's current medications.  Assessment/Plan: A 36 yo admitted with Sickle cell crisis with acute on chronic respiratory failure. 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 Acute Pulmonary Edema with Hypoxia:  Patient transfused 2 units PRBC to improve oxygen carrying capacity. We will attempt to titrate oxygen down to nasal cannula. We will repeat ABG  2. Acute on Chronic Respiratory Failure with Hypoxia:CXR negative for any infiltrates and shows pulmonary edema. We will repeat CXR and see if any improvement. Continue diuresis and monitor oxygen saturations. 3. Hb SS with crisis:Continue PCA dose to 0.7 mg and Continue Toradol and Endari. 4. Elevated Troponin: Patient stable. No evidence  of decompensation. 5. Leukocytosis: Improving. Likely related to crisis. No clinical evidence of infection.  6. Anemia of chronic disease: Hb at baseline. 7. Chronic pain: Continue MS Contin 8. Chronic Anticoagulation: Pt on Xarelto for recurrent PE's. Continue Xarelto.   LOS: 7 days   Aronda Burford,[name] 04/17/2016, 2:37 PM

## 2016-04-17 NOTE — Progress Notes (Signed)
Patient refusing ABG notified Dr. Jonelle Sidle to speak with patient concerning test.

## 2016-04-18 LAB — TYPE AND SCREEN
ABO/RH(D): O POS
ANTIBODY SCREEN: NEGATIVE
UNIT DIVISION: 0
Unit division: 0

## 2016-04-18 LAB — COMPREHENSIVE METABOLIC PANEL
ALBUMIN: 4 g/dL (ref 3.5–5.0)
ALT: 33 U/L (ref 17–63)
AST: 55 U/L — ABNORMAL HIGH (ref 15–41)
Alkaline Phosphatase: 124 U/L (ref 38–126)
Anion gap: 7 (ref 5–15)
BUN: 24 mg/dL — ABNORMAL HIGH (ref 6–20)
CHLORIDE: 105 mmol/L (ref 101–111)
CO2: 30 mmol/L (ref 22–32)
CREATININE: 0.87 mg/dL (ref 0.61–1.24)
Calcium: 9 mg/dL (ref 8.9–10.3)
GFR calc non Af Amer: >60 mL/min (ref >60)
GLUCOSE: 131 mg/dL — AB (ref 65–99)
Potassium: 3.5 mmol/L (ref 3.5–5.1)
SODIUM: 142 mmol/L (ref 135–145)
Total Bilirubin: 3 mg/dL — ABNORMAL HIGH (ref 0.3–1.2)
Total Protein: 7.4 g/dL (ref 6.5–8.1)

## 2016-04-18 LAB — CBC WITH DIFFERENTIAL/PLATELET
BASOS ABS: 0.1 10*3/uL (ref 0.0–0.1)
Basophils Relative: 1 %
Eosinophils Absolute: 0.6 10*3/uL (ref 0.0–0.7)
Eosinophils Relative: 6 %
HCT: 21.8 % — ABNORMAL LOW (ref 39.0–52.0)
HEMOGLOBIN: 7.6 g/dL — AB (ref 13.0–17.0)
LYMPHS ABS: 2 10*3/uL (ref 0.7–4.0)
Lymphocytes Relative: 19 %
MCH: 30.5 pg (ref 26.0–34.0)
MCHC: 34.9 g/dL (ref 30.0–36.0)
MCV: 87.6 fL (ref 78.0–100.0)
MONO ABS: 2.1 10*3/uL — AB (ref 0.1–1.0)
Monocytes Relative: 20 %
NEUTROS PCT: 54 %
Neutro Abs: 5.9 10*3/uL (ref 1.7–7.7)
PLATELETS: 461 10*3/uL — AB (ref 150–400)
RBC: 2.49 MIL/uL — AB (ref 4.22–5.81)
RDW: 21.1 % — ABNORMAL HIGH (ref 11.5–15.5)
WBC: 10.7 10*3/uL — AB (ref 4.0–10.5)

## 2016-04-18 LAB — BPAM RBC
BLOOD PRODUCT EXPIRATION DATE: 201803302359
Blood Product Expiration Date: 201804062359
ISSUE DATE / TIME: 201803172154
ISSUE DATE / TIME: 201803171721
Unit Type and Rh: 5100
Unit Type and Rh: 5100

## 2016-04-18 NOTE — Progress Notes (Signed)
Date:  April 18, 2016 Chart reviewed for concurrent status and case management needs. Will continue to follow patient progress. Discharge Planning: following for needs Expected discharge date: [id number] Velva Harman, BSN, [place], [place]

## 2016-04-18 NOTE — Progress Notes (Signed)
Subjective: Patient is doing much better today. He is currently on 6 L of oxygen by nasal cannula. No significant shortness of breath no cough. He is on the Dilaudid PCA as well as his oral medications. Still has pain at 9/10 in his back and legs. He has been stable and other vitals are equally stable. No significant SOB.  Objective: Vital signs in last 24 hours: Temp:  [98.5 F (36.9 C)-98.9 F (37.2 C)] 98.5 F (36.9 C) (03/19 0000) Pulse Rate:  [83-84] 83 (03/18 1754) Resp:  [12-19] 13 (03/19 0600) BP: (99-156)/(48-135) 99/48 (03/19 0600) SpO2:  [90 %-98 %] 91 % (03/19 0600) FiO2 (%):  [40 %-50 %] 40 % (03/18 1100) Weight:  [83.7 kg (184 lb 8.4 oz)] 83.7 kg (184 lb 8.4 oz) (03/19 0500) Weight change:  Last BM Date: 04/17/16  Intake/Output from previous day: 03/18 0701 - 03/19 0700 In: 650 [P.O.:600; IV Piggyback:50] Out: 2825 [Urine:2825] Intake/Output this shift: No intake/output data recorded.  General appearance: alert, cooperative and no distress Back: symmetric, no curvature. ROM normal. No CVA tenderness. Resp: clear to auscultation bilaterally Chest wall: no tenderness Cardio: regular rate and rhythm, S1, S2 normal, no murmur, click, rub or gallop GI: soft, non-tender; bowel sounds normal; no masses,  no organomegaly Extremities: extremities normal, atraumatic, no cyanosis or edema Pulses: 2+ and symmetric Skin: Skin color, texture, turgor normal. No rashes or lesions Neurologic: Grossly normal  Lab Results:  Recent Labs  04/16/16 0810 04/17/16 0845  WBC 8.8 9.0  HGB 5.7* 7.9*  HCT 16.1* 23.1*  PLT 420* 491*   BMET  Recent Labs  04/16/16 0810  NA 138  K 3.5  CL 108  CO2 25  GLUCOSE 288*  BUN 23*  CREATININE 1.36*  CALCIUM 7.6*    Studies/Results: Dg Chest Port 1 View  Result Date: 04/17/2016 CLINICAL DATA:  Lethargy.  Shortness of breath. EXAM: PORTABLE CHEST 1 VIEW COMPARISON:  None. FINDINGS: Stable enlarged cardiac silhouette. Increased  density at the left lateral lung base. Interval mild patchy and linear opacity at the right lung base. Left subclavian porta catheter tip in the right atrium. No acute bony abnormality. 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 IMPRESSION: 1. Interval atelectasis or pneumonia at the left lateral lung base. 2. Interval mild atelectasis and possible pneumonia at the right lung base. 3. Stable cardiomegaly. 4. Left subclavian porta catheter tip in the right atrium. This could be retracted 4.5 cm to place it in the superior vena cava. Electronically Signed   By: Claudie Revering M.D.   On: 04/17/2016 09:06    Medications: I have reviewed the patient's current medications.  Assessment/Plan: A 36 yo admitted with Sickle cell crisis with acute on chronic respiratory failure. 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 Acute Pulmonary Edema with Hypoxia:   oxygen demand has decreased since patient's transfusion of packed red blood cells. He is now on 6 L of oxygen per minute. I will transfer him to telemetry bed and out of the ICU. Continue to titrate his oxygen down 2. Acute on Chronic Respiratory Failure with Hypoxia:Repeat chest x-ray  showed no evidence of pneumonia or pulmonary edema. Continue Possible. 3. Hb SS with crisis:Pain management appears to be stable at this point. Continue PCA dose to 0.7 mg and Continue Toradol and Endari. 4. Elevated Troponin: This has since resolved. No further workup warranted. 5. Leukocytosis: Improving. Likely related to crisis. No clinical evidence of infection.  6. Anemia of chronic disease: Hb at baseline after blood  transfusion 7. Chronic pain: Continue MS Contin 8. Chronic Anticoagulation: Pt on Xarelto for recurrent PE's. Continue Xarelto.   LOS: 8 days   Garnell Phenix,[name] 04/18/2016, 7:18 AM

## 2016-04-19 LAB — CBC WITH DIFFERENTIAL/PLATELET
BASOS PCT: 1 %
Basophils Absolute: 0.1 10*3/uL (ref 0.0–0.1)
EOS ABS: 0.7 10*3/uL (ref 0.0–0.7)
EOS PCT: 6 %
HCT: 22.7 % — ABNORMAL LOW (ref 39.0–52.0)
Hemoglobin: 7.9 g/dL — ABNORMAL LOW (ref 13.0–17.0)
Lymphocytes Relative: 21 %
Lymphs Abs: 2.6 10*3/uL (ref 0.7–4.0)
MCH: 31.2 pg (ref 26.0–34.0)
MCHC: 34.8 g/dL (ref 30.0–36.0)
MCV: 89.7 fL (ref 78.0–100.0)
Monocytes Absolute: 2 10*3/uL — ABNORMAL HIGH (ref 0.1–1.0)
Monocytes Relative: 16 %
Neutro Abs: 7 10*3/uL (ref 1.7–7.7)
Neutrophils Relative %: 56 %
PLATELETS: 512 10*3/uL — AB (ref 150–400)
RBC: 2.53 MIL/uL — AB (ref 4.22–5.81)
RDW: 21.3 % — ABNORMAL HIGH (ref 11.5–15.5)
WBC: 12.4 10*3/uL — ABNORMAL HIGH (ref 4.0–10.5)

## 2016-04-19 LAB — COMPREHENSIVE METABOLIC PANEL
ALBUMIN: 3.9 g/dL (ref 3.5–5.0)
ALT: 35 U/L (ref 17–63)
AST: 60 U/L — AB (ref 15–41)
Alkaline Phosphatase: 103 U/L (ref 38–126)
Anion gap: 6 (ref 5–15)
BUN: 25 mg/dL — AB (ref 6–20)
CHLORIDE: 104 mmol/L (ref 101–111)
CO2: 30 mmol/L (ref 22–32)
CREATININE: 0.9 mg/dL (ref 0.61–1.24)
Calcium: 9.2 mg/dL (ref 8.9–10.3)
GFR calc Af Amer: >60 mL/min (ref >60)
GFR calc non Af Amer: >60 mL/min (ref >60)
Glucose, Bld: 125 mg/dL — ABNORMAL HIGH (ref 65–99)
POTASSIUM: 3.7 mmol/L (ref 3.5–5.1)
Sodium: 140 mmol/L (ref 135–145)
Total Bilirubin: 3.3 mg/dL — ABNORMAL HIGH (ref 0.3–1.2)
Total Protein: 7.6 g/dL (ref 6.5–8.1)

## 2016-04-19 MED ORDER — HYDROMORPHONE 1 MG/ML IV SOLN
INTRAVENOUS | Status: DC
  Administered 2016-04-19: 6.26 mg via INTRAVENOUS
  Administered 2016-04-19: 2.8 mg via INTRAVENOUS
  Administered 2016-04-19: 6.3 mg via INTRAVENOUS
  Administered 2016-04-19: 12.6 mg via INTRAVENOUS
  Administered 2016-04-19: 6.3 mg via INTRAVENOUS
  Administered 2016-04-19: 12:00:00 via INTRAVENOUS
  Administered 2016-04-20: 0.7 mg via INTRAVENOUS
  Administered 2016-04-20: 11.2 mg via INTRAVENOUS
  Administered 2016-04-20: 0 mg via INTRAVENOUS
  Administered 2016-04-20: 14 mg via INTRAVENOUS
  Administered 2016-04-20: 15:00:00 via INTRAVENOUS
  Administered 2016-04-20: 15.4 mg via INTRAVENOUS
  Administered 2016-04-21: 1.2 mg via INTRAVENOUS
  Administered 2016-04-21: 12.6 mg via INTRAVENOUS
  Administered 2016-04-21: via INTRAVENOUS
  Administered 2016-04-21: 9.8 mg via INTRAVENOUS
  Filled 2016-04-19 (×5): qty 25

## 2016-04-19 NOTE — Progress Notes (Signed)
Subjective: Patient is stable on telemetry. Pain is down to 8 out of 10 today. He still is on 6 L of oxygen although not in respiratory distress. Blood pressure and other vitals are equally stable. No significant SOB. No nausea vomiting or diarrhea  Objective: Vital signs in last 24 hours: Temp:  [98 F (36.7 C)-98.6 F (37 C)] 98 F (36.7 C) (03/20 1211) Pulse Rate:  [71-88] 76 (03/20 1211) Resp:  [14-19] 16 (03/20 1600) BP: (102-144)/(60-81) 102/66 (03/20 1211) SpO2:  [90 %-95 %] 93 % (03/20 1600) Weight:  [83.6 kg (184 lb 4.9 oz)-83.7 kg (184 lb 8.4 oz)] 83.6 kg (184 lb 4.9 oz) (03/20 7867) Weight change: 0 kg (0 lb) Last BM Date: 04/17/16  Intake/Output from previous day: 03/19 0701 - 03/20 0700 In: 115.4 [I.V.:15.4; IV Piggyback:100] Out: 6720 [Urine:1775] Intake/Output this shift: Total I/O In: 360 [P.O.:360] Out: 1000 [Urine:1000]  General appearance: alert, cooperative and no distress Back: symmetric, no curvature. ROM normal. No CVA tenderness. Resp: clear to auscultation bilaterally Chest wall: no tenderness Cardio: regular rate and rhythm, S1, S2 normal, no murmur, click, rub or gallop GI: soft, non-tender; bowel sounds normal; no masses,  no organomegaly Extremities: extremities normal, atraumatic, no cyanosis or edema Pulses: 2+ and symmetric Skin: Skin color, texture, turgor normal. No rashes or lesions Neurologic: Grossly normal  Lab Results:  Recent Labs  04/18/16 0750 04/19/16 0635  WBC 10.7* 12.4*  HGB 7.6* 7.9*  HCT 21.8* 22.7*  PLT 461* 512*   BMET  Recent Labs  04/18/16 0750 04/19/16 0635  NA 142 140  K 3.5 3.7  CL 105 104  CO2 30 30  GLUCOSE 131* 125*  BUN 24* 25*  CREATININE 0.87 0.90  CALCIUM 9.0 9.2    Studies/Results: No results found.  Medications: I have reviewed the patient's current medications.  Assessment/Plan: A 36 yo admitted with Sickle cell crisis with acute on chronic respiratory failure. 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 Acute Pulmonary Edema with Hypoxia:   due to significant pulmonary hypertension. Continue to titrate his oxygen down 2. Acute on Chronic Respiratory Failure with Hypoxia:Repeat chest x-ray showed no evidence of pneumonia or pulmonary edema. Continue treatment as above 3. Hb SS with crisis:Continue PCA dose to 0.7 mg but reduce the interval to every 8 minutes. and Continue Toradol and Endari. 4. Elevated Troponin: This has since resolved. No further workup warranted. 5. Leukocytosis: Improving. Likely related to crisis. No clinical evidence of infection.  6. Anemia of chronic disease: Hb at baseline after blood transfusion 7. Chronic pain: Continue MS Contin 8. Chronic Anticoagulation: Pt on Xarelto for recurrent PE's. Continue Xarelto.   LOS: 9 days   [name],[name] 04/19/2016, 4:51 PM

## 2016-04-20 NOTE — Progress Notes (Signed)
Hydroxyurea (Hydrea) hold criteria: ANC < 2 Pltc < 80K in sickle-cell patients; < 100K in other patients Hgb <= 6 in sickle-cell patients; < 8 in other patients  Reticulocytes < 80K when Hgb < 9  Hg 7.9 yesterday.  Hydrea held since 3/17 for Hg 5.7  ? Resume hydrea?  Eudelia Bunch, Pharm.D. [phone] 04/20/2016 12:07 PM

## 2016-04-20 NOTE — Progress Notes (Signed)
Nutrition Brief Note  Patient seen for LOS (10 days).  Wt Readings from Last 15 Encounters:  04/20/16 187 lb 2.7 oz (84.9 kg)  04/09/16 184 lb (83.5 kg)  04/07/16 184 lb (83.5 kg)  04/07/16 184 lb (83.5 kg)  04/05/16 184 lb (83.5 kg)  04/02/16 187 lb 6.3 oz (85 kg)  03/27/16 175 lb (79.4 kg)  03/24/16 175 lb (79.4 kg)  03/22/16 175 lb (79.4 kg)  03/22/16 180 lb (81.6 kg)  03/13/16 180 lb 1.9 oz (81.7 kg)  03/11/16 178 lb 4 oz (80.9 kg)  03/09/16 186 lb 1.1 oz (84.4 kg)  03/05/16 175 lb (79.4 kg)  03/01/16 163 lb 11.2 oz (74.3 kg)    Body mass index is 25.38 kg/m. Patient meets criteria for overweight based on current BMI. Skin WDL.  Pt with acute pulmonary edema with hypoxia on O2. Pt with hx of sickle cell. Current diet order is Regular, patient is consuming approximately 75% of meals at this time. Labs and medications reviewed.   No nutrition interventions warranted at this time. If nutrition issues arise, please consult RD.     Jarome Matin, MS, RD, LDN, [facility] In[facility] Clinical Dietitian Pager # [phone] After hours/weekend pager # [phone]

## 2016-04-20 NOTE — Care Management Important Message (Signed)
Important Message  Patient Details  Name: [name] MRN: [id number] Date of Birth: 1980/09/16   Medicare Important Message Given:  Yes    Kerin Salen 04/20/2016, 11:54 AMImportant Message  Patient Details  Name: [name] MRN: [id number] Date of Birth: 07-11-1980   Medicare Important Message Given:  Yes    Kerin Salen 04/20/2016, 11:54 AM

## 2016-04-20 NOTE — Progress Notes (Signed)
Report given by ongoing nurse. Agreed with nurse assessment of patient and will cont to monitor.  

## 2016-04-20 NOTE — Progress Notes (Signed)
Subjective: Patient is doing much better currently on 5-6 L of oxygen per minute. He has no fever and chest pain has resolved. He has not been out of bed since admission. Continues to be on Dilaudid PCA with oral medications including Dilaudid tablet as well as MS Contin. He has used 29 mg of the Dilaudid was 58 demands of 56 deliveries since yesterday.. No significant SOB. No nausea vomiting or diarrhea  Objective: Vital signs in last 24 hours: Temp:  [97.5 F (36.4 C)-98.4 F (36.9 C)] 98.1 F (36.7 C) (03/21 1430) Pulse Rate:  [77-89] 79 (03/21 1707) Resp:  [11-20] 15 (03/21 1504) BP: (101-119)/(59-74) 110/68 (03/21 1707) SpO2:  [91 %-100 %] 91 % (03/21 1504) Weight:  [84.9 kg (187 lb 2.7 oz)] 84.9 kg (187 lb 2.7 oz) (03/21 0659) Weight change: 1.2 kg (2 lb 10.3 oz) Last BM Date: 04/17/16  Intake/Output from previous day: 03/20 0701 - 03/21 0700 In: 460 [P.O.:360; IV Piggyback:100] Out: 1625 [Urine:1625] Intake/Output this shift: Total I/O In: -  Out: 1300 [Urine:1300]  General appearance: alert, cooperative and no distress Back: symmetric, no curvature. ROM normal. No CVA tenderness. Resp: clear to auscultation bilaterally Chest wall: no tenderness Cardio: regular rate and rhythm, S1, S2 normal, no murmur, click, rub or gallop GI: soft, non-tender; bowel sounds normal; no masses,  no organomegaly Extremities: extremities normal, atraumatic, no cyanosis or edema Pulses: 2+ and symmetric Skin: Skin color, texture, turgor normal. No rashes or lesions Neurologic: Grossly normal  Lab Results:  Recent Labs  04/18/16 0750 04/19/16 0635  WBC 10.7* 12.4*  HGB 7.6* 7.9*  HCT 21.8* 22.7*  PLT 461* 512*   BMET  Recent Labs  04/18/16 0750 04/19/16 0635  NA 142 140  K 3.5 3.7  CL 105 104  CO2 30 30  GLUCOSE 131* 125*  BUN 24* 25*  CREATININE 0.87 0.90  CALCIUM 9.0 9.2    Studies/Results: No results found.  Medications: I have reviewed the patient's current  medications.  Assessment/Plan: A 36 yo admitted with Sickle cell crisis with acute on chronic respiratory failure. 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) Active Problems:   Hx of pulmonary embolus   Pulmonary HTN   Chronic anticoagulation   Essential hypertension   Anemia of chronic disease   Cor pulmonale, chronic (HCC)   Troponin level elevated   Prolonged Q-T interval on ECG   Acute on chronic respiratory failure with hypoxia (HCC)   Acute pulmonary edema (HCC)  1. Acute Pulmonary Edema with Hypoxia:   this appears to be improving daily. Patient will be mobilized today and see what his oxygen demand would be. Continue to titrate his oxygen down 2. Acute on Chronic Respiratory Failure with Hypoxia:Repeat chest x-ray showed no evidence of pneumonia or pulmonary edema. Discontinued antibiotics. Continue treatment as above 3. Hb SS with crisis:Continue PCA dose to 0.7 mg  every 8 minutes and attempt to titrate it off for possible discharge next 24-48 hours 4. Elevated Troponin: This has since resolved. No further workup warranted. 5. Leukocytosis: Improving. Likely related to crisis. No clinical evidence of infection.  6. Anemia of chronic disease: Hb at baseline after blood transfusion 7. Chronic pain: Continue MS Contin 8. Chronic Anticoagulation: Pt on Xarelto for recurrent PE's. Continue Xarelto.   LOS: 10 days   Areanna Gengler,[name] 04/20/2016, 5:16 PM

## 2016-04-21 LAB — CBC WITH DIFFERENTIAL/PLATELET
BASOS PCT: 1 %
Basophils Absolute: 0.1 10*3/uL (ref 0.0–0.1)
EOS PCT: 7 %
Eosinophils Absolute: 0.9 10*3/uL — ABNORMAL HIGH (ref 0.0–0.7)
HEMATOCRIT: 22.5 % — AB (ref 39.0–52.0)
Hemoglobin: 7.7 g/dL — ABNORMAL LOW (ref 13.0–17.0)
Lymphocytes Relative: 15 %
Lymphs Abs: 2 10*3/uL (ref 0.7–4.0)
MCH: 30.9 pg (ref 26.0–34.0)
MCHC: 34.2 g/dL (ref 30.0–36.0)
MCV: 90.4 fL (ref 78.0–100.0)
Monocytes Absolute: 2.1 10*3/uL — ABNORMAL HIGH (ref 0.1–1.0)
Monocytes Relative: 16 %
NEUTROS PCT: 61 %
Neutro Abs: 8 10*3/uL — ABNORMAL HIGH (ref 1.7–7.7)
Platelets: 536 10*3/uL — ABNORMAL HIGH (ref 150–400)
RBC: 2.49 MIL/uL — ABNORMAL LOW (ref 4.22–5.81)
RDW: 21.3 % — AB (ref 11.5–15.5)
WBC: 13.1 10*3/uL — ABNORMAL HIGH (ref 4.0–10.5)

## 2016-04-21 LAB — FOLATE: FOLATE: 40.8 ng/mL (ref >5.9)

## 2016-04-21 LAB — RETICULOCYTES
RBC.: 2.49 MIL/uL — ABNORMAL LOW (ref 4.22–5.81)
Retic Count, Absolute: 94.6 10*3/uL (ref 19.0–186.0)
Retic Ct Pct: 3.8 % — ABNORMAL HIGH (ref 0.4–3.1)

## 2016-04-21 LAB — BRAIN NATRIURETIC PEPTIDE: B NATRIURETIC PEPTIDE 5: 321.8 pg/mL — AB (ref 0.0–100.0)

## 2016-04-21 MED ORDER — IBUPROFEN 200 MG PO TABS
600.0000 mg | ORAL_TABLET | Freq: Four times a day (QID) | ORAL | Status: DC | PRN
  Administered 2016-04-23 – 2016-04-25 (×2): 600 mg via ORAL
  Filled 2016-04-21 (×2): qty 3

## 2016-04-21 MED ORDER — IBUPROFEN 200 MG PO TABS
600.0000 mg | ORAL_TABLET | Freq: Four times a day (QID) | ORAL | Status: AC
  Administered 2016-04-21 – 2016-04-22 (×3): 600 mg via ORAL
  Filled 2016-04-21 (×3): qty 3

## 2016-04-21 MED ORDER — HYDROMORPHONE 1 MG/ML IV SOLN
INTRAVENOUS | Status: DC
  Administered 2016-04-21: 0.6 mg via INTRAVENOUS
  Administered 2016-04-21: 9 mg via INTRAVENOUS
  Administered 2016-04-21: 12:00:00 via INTRAVENOUS
  Administered 2016-04-22: 10.2 mg via INTRAVENOUS
  Administered 2016-04-22: 1.2 mg via INTRAVENOUS
  Administered 2016-04-22: 2.4 mg via INTRAVENOUS
  Administered 2016-04-22: 7.1 mg via INTRAVENOUS
  Filled 2016-04-21 (×2): qty 25

## 2016-04-21 MED ORDER — FOLIC ACID 1 MG PO TABS
2.0000 mg | ORAL_TABLET | Freq: Every day | ORAL | Status: DC
  Administered 2016-04-22 – 2016-04-26 (×5): 2 mg via ORAL
  Filled 2016-04-21 (×5): qty 2

## 2016-04-21 NOTE — Progress Notes (Addendum)
Patient ID: [name], male   DOB: 08-Jun-1980, 36 y.o.   MRN: [id number] Reviewed labs and they show reticulocytopenia. Will check B-12 and folate levels. Pt is s/p transfusion of 2 units RBC so doubt Iron panel will be of much value. Will also check Parvo-B19. Hydrea has been on hold for 7 days and should not be the reason for reticulocytopenia. Pt is on ASA which could rarely cause aplastic anemia. He may have decrease erythropoietin levels so will check levels and re-check Hb and reticulocytes tomorrow. Will ask Hematology to see in consult.   [name],[name]  Addendum: Discussed case with Dr. Jana Hakim who agrees with investigations and recommends waiting for those results before proceeding with a BM bx.   [name],[name]

## 2016-04-21 NOTE — Progress Notes (Signed)
SICKLE CELL SERVICE PROGRESS NOTE  [name] EGB:[id number] DOB: [birth year] DOA: 04/09/2016 PCP: [name], MD  Assessment/Plan: Principal Problem:   Sickle cell pain crisis ([name]) Active Problems:   Pulmonary HTN   Cor pulmonale, chronic (HCC)   Chronic anticoagulation   Anemia of chronic disease   Acute on chronic respiratory failure with hypoxia (HCC)   Hx of pulmonary embolus   Essential hypertension   Troponin level elevated   Prolonged Q-T interval on ECG   Acute pulmonary edema ([place])  1. Acute Pulmonary Edema with Hypoxia: Oxygenation improved. Will check BNP today. 2. Acute on Chronic Respiratory Failure with Hypoxia: Pt has been weaned to 5 L/min of Oxygen. Since admission he has been transfused 2 units RBC's and has been weaned down to 5 L/min of Oxygen. Will continue to wean Oxygen for saturations > 87%. Will check CXR today. Check ABG when Oxygen weaned to saturations of 87%.  3. Anemia of Chronic Disease: It is unclear if patient hemolyzed RBC's or had a failure of bone marrow. However his Hb decreased to a nadir of 5.7 and he was transfused 2 units of RBC's. At present Hb is 7.9 g/dL.  4. Hb SS with crisis: Pt has been on high dose opioids for 11 days. Will start weaning PCA to bolus of 0.6 and lockout interval of 10 minutes. Will also schedule Ibuprofen.  5. Leukocytosis: At baseline. Likely related to crisis. No clinical evidence of infection.  6. Chronic pain: Continue MS Contin 7. Chronic Anticoagulation: Pt on Xarelto for recurrent PE's. Continue Xarelto. Pt reports that he has missed 1 dose of Xarelto. However I do not feel that this one dose has conferred an increased risk of PE. Nevertheless the treatment would be to continue Xarelto.  8. Benign HTN: BP will controlled with current medications except with pain when they become elevated with pain.  Code Status: Full Code Family Communication: N/A Disposition Plan: Not yet ready for discharge.    [name],[name]  Pager [phone]. If 7PM-7AM, please contact night-coverage.  04/21/2016, 10:30 AM  LOS: 11 days   Interim History: Pt reports pain in low back which started 5 days ago. He has been essentially bed-bound for the last 11 days. He has also been receiving high dose opioids for the last 11 days. He has used Pt also started on Endari on 04/13/2016. Home supply of Letaris ran out today. Pt to check with someone in his home to see if he had anymore delivered to him ( he receives Nutritional therapist through [employer]).   Review of Records:  2-D ECHO ([facility]-01/06/16): Summary Technically adequate study Dilated right atrium and right ventricle with abnormal right ventricular systolic function consistent with acute strain on the right ventricle such as pulmonary embolus Other chambers appear structurally normal Normal LV systolic function , flattened intraventricular septum consistent with pressure overload on the right side, normal LV systolic ejection fraction calculated at 68% Valves appear structurally normal mild mitral regurgitation Moderate to severe tricuspid regurgitation with moderate pulmonary hypertension right ventricular systolic pressure estimated of 60 mmHg No pericardial effusion The findings are consistent with acute/ recent pulmonary embolus Signature   Consultants:  None  Procedures:  None  Antibiotics:  None  Objective: Vitals:   04/21/16 0453 04/21/16 0552 04/21/16 0800 04/21/16 0840  BP: 98/70   100/67  Pulse: 86   77  Resp: 20  14 15   Temp: 98.2 F (36.8 C)   99.3 F (37.4 C)  TempSrc:  Oral   Oral  SpO2: 97%  95% 98%  Weight: 84.6 kg (186 lb 8 oz) 85.3 kg (188 lb 0.8 oz)    Height:       Weight change: -0.304 kg (-10.7 oz)  Intake/Output Summary (Last 24 hours) at 04/21/16 1030 Last data filed at 04/21/16 0453  Gross per 24 hour  Intake              700 ml  Output             2600 ml  Net            -1900 ml     General: Alert, awake, oriented x3, in no apparent distress. HEENT: [place]/AT PEERL, EOMI, anicteric.  Heart: Regular rate and rhythm, II/VI systolic ejection murmurs at base, no rubs or gallops, PMI non-displaced, no heaves or thrills on palpation.  Lungs: Clear to auscultation except at b/l bases where there are mild crackles. No wheezing or rhonchi noted. No increased vocal fremitus resonant to percussion  Abdomen: Soft, nontender, nondistended, positive bowel sounds, no masses no hepatosplenomegaly noted.  Neuro: No focal neurological deficits noted cranial nerves II through XII grossly intact. . Strength at functional baseline in bilateral upper and lower extremities. Musculoskeletal: No warmth swelling or erythema around joints, no spinal tenderness noted. Psychiatric: Patient alert and oriented x3, good insight and cognition, good recent to remote recall.    Data Reviewed: Basic Metabolic Panel:  Recent Labs Lab 04/16/16 0810 04/18/16 0750 04/19/16 0635  NA 138 142 140  K 3.5 3.5 3.7  CL 108 105 104  CO2 25 30 30   GLUCOSE 288* 131* 125*  BUN 23* 24* 25*  CREATININE 1.36* 0.87 0.90  CALCIUM 7.6* 9.0 9.2   Liver Function Tests:  Recent Labs Lab 04/16/16 0810 04/18/16 0750 04/19/16 0635  AST 44* 55* 60*  ALT 27 33 35  ALKPHOS 89 124 103  BILITOT 2.7* 3.0* 3.3*  PROT 6.1* 7.4 7.6  ALBUMIN 3.1* 4.0 3.9   No results for input(s): LIPASE, AMYLASE in the last 168 hours. No results for input(s): AMMONIA in the last 168 hours. CBC:  Recent Labs Lab 04/16/16 0810 04/17/16 0845 04/18/16 0750 04/19/16 0635  WBC 8.8 9.0 10.7* 12.4*  NEUTROABS 5.0 4.4 5.9 7.0  HGB 5.7* 7.9* 7.6* 7.9*  HCT 16.1* 23.1* 21.8* 22.7*  MCV 86.6 85.6 87.6 89.7  PLT 420* 491* 461* 512*   Cardiac Enzymes: No results for input(s): CKTOTAL, CKMB, CKMBINDEX, TROPONINI in the last 168 hours. BNP (last 3 results)  Recent Labs  04/11/16 0351 04/13/16 1015 04/14/16 0500  BNP 762.8*  364.2* 315.7*    ProBNP (last 3 results) No results for input(s): PROBNP in the last 8760 hours.  CBG: No results for input(s): GLUCAP in the last 168 hours.  No results found for this or any previous visit (from the past 240 hour(s)).   Studies: Dg Chest 2 View  Result Date: 04/13/2016 CLINICAL DATA:  History of sickle cell disease. Patient admitted 04/10/2016 with chest pain. Hypoxia and pulmonary edema. EXAM: CHEST  2 VIEW COMPARISON:  PA and lateral chest 04/09/2016 and 01/31/2016. Single-view of the chest 04/11/2016. FINDINGS: Marked cardiomegaly is again seen. There is mild interstitial edema which appears unchanged since yesterday's examination. No pneumothorax or pleural effusion. No consolidative process. Port-A-Cath is in place. Avascular necrosis left humeral head is noted. IMPRESSION: No change in mild interstitial edema in patient with marked cardiomegaly. Electronically Signed   By: Marcello Moores  Dalessio M.D.   On: 04/13/2016 10:54   Dg Chest 2 View  Result Date: 04/10/2016 CLINICAL DATA:  Bilateral chest wall pain. Sickle cell disease. Pain began yesterday. EXAM: CHEST  2 VIEW COMPARISON:  03/27/2016 FINDINGS: Left subclavian port extends to the low SVC. Unchanged cardiomegaly. No airspace consolidation. No pleural effusions. Pulmonary vasculature is normal. Hilar and mediastinal contours are unremarkable and unchanged. IMPRESSION: Unchanged cardiomegaly.  No consolidation or effusion. Electronically Signed   By: Andreas Newport M.D.   On: 04/10/2016 00:50   Dg Chest 2 View  Result Date: 03/27/2016 CLINICAL DATA:  Left chest pain.  Sickle cell anemia EXAM: CHEST  2 VIEW COMPARISON:  03/22/2016 FINDINGS: Cardiac enlargement with vascular congestion. Negative for edema or effusion. Negative for pneumonia. Port-A-Cath tip in the SVC. IMPRESSION: Cardiac enlargement with pulmonary vascular congestion. Negative for pneumonia or edema. Electronically Signed   By: Franchot Gallo M.D.   On:  03/27/2016 16:02   Dg Chest Port 1 View  Result Date: 04/17/2016 CLINICAL DATA:  Lethargy.  Shortness of breath. EXAM: PORTABLE CHEST 1 VIEW COMPARISON:  None. FINDINGS: Stable enlarged cardiac silhouette. Increased density at the left lateral lung base. Interval mild patchy and linear opacity at the right lung base. Left subclavian porta catheter tip in the right atrium. No acute bony abnormality. 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 IMPRESSION: 1. Interval atelectasis or pneumonia at the left lateral lung base. 2. Interval mild atelectasis and possible pneumonia at the right lung base. 3. Stable cardiomegaly. 4. Left subclavian porta catheter tip in the right atrium. This could be retracted 4.5 cm to place it in the superior vena cava. Electronically Signed   By: Claudie Revering M.D.   On: 04/17/2016 09:06   Dg Chest Port 1 View  Result Date: 04/11/2016 CLINICAL DATA:  Initial evaluation for sickle cell pain crisis. Worsened shortness of breath. EXAM: PORTABLE CHEST 1 VIEW COMPARISON:  Prior radiograph from 04/09/2016. FINDINGS: Left-sided Port-A-Cath in place, stable. Severe cardiomegaly not significantly changed. Mediastinal silhouette within normal limits. Lungs mildly hypoinflated. There is slightly worsened pulmonary vascular congestion as compared to previous. Slightly worsened right infrahilar opacity, which may reflect developing infiltrate and/or atelectasis. More linear opacities at the bilateral lung bases most consistent with atelectasis. No pleural effusion. No pneumothorax. No acute osseous abnormality. IMPRESSION: 1. Severe cardiomegaly with slightly worsened diffuse pulmonary vascular congestion as compared to 04/09/16, which may reflect developing pulmonary edema. 2. Slightly patchy right infrahilar opacity, which may reflect congestion and/or developing infiltrate. 3. Mildly increased superimposed bibasilar atelectasis. Electronically Signed   By: Jeannine Boga  M.D.   On: 04/11/2016 06:10    Scheduled Meds: . ambrisentan  5 mg Oral Daily  . aspirin  81 mg Oral Daily  . carvedilol  3.125 mg Oral BID WC  . Chlorhexidine Gluconate Cloth  6 each Topical Q0600  . cholecalciferol  2,000 Units Oral Daily  . folic acid  1 mg Oral Daily  . gabapentin  300 mg Oral TID  . HYDROmorphone   Intravenous Q4H  . HYDROmorphone  6 mg Oral Q4H  . L-glutamine  15 g Oral BID  . mouth rinse  15 mL Mouth Rinse BID  . morphine  30 mg Oral Q12H  . rivaroxaban  20 mg Oral Q supper  . senna-docusate  1 tablet Oral BID  . torsemide  20 mg Oral Daily   Continuous Infusions:   Principal Problem:   Sickle cell pain crisis ([place])  Active Problems:   Pulmonary HTN   Cor pulmonale, chronic (HCC)   Chronic anticoagulation   Anemia of chronic disease   Acute on chronic respiratory failure with hypoxia (HCC)   Hx of pulmonary embolus   Essential hypertension   Troponin level elevated   Prolonged Q-T interval on ECG   Acute pulmonary edema (HCC)   In excess of 25 minutes spent during this visit. Greater than 50% involved face to face contact with the patient for assessment, counseling and coordination of care.

## 2016-04-22 ENCOUNTER — Inpatient Hospital Stay (HOSPITAL_COMMUNITY): Payer: Medicare Other

## 2016-04-22 DIAGNOSIS — R701 Abnormal plasma viscosity: Secondary | ICD-10-CM

## 2016-04-22 LAB — RETICULOCYTES
RBC.: 2.09 MIL/uL — AB (ref 4.22–5.81)
RETIC COUNT ABSOLUTE: 94.1 10*3/uL (ref 19.0–186.0)
Retic Ct Pct: 4.5 % — ABNORMAL HIGH (ref 0.4–3.1)

## 2016-04-22 LAB — BLOOD GAS, ARTERIAL
ACID-BASE EXCESS: 4.8 mmol/L — AB (ref 0.0–2.0)
BICARBONATE: 29.3 mmol/L — AB (ref 20.0–28.0)
DRAWN BY: 270211
O2 Content: 3 L/min
O2 Saturation: 91.5 %
PCO2 ART: 46.2 mmHg (ref 32.0–48.0)
PH ART: 7.419 (ref 7.350–7.450)
Patient temperature: 98.6
pO2, Arterial: 71.2 mmHg — ABNORMAL LOW (ref 83.0–108.0)

## 2016-04-22 LAB — CBC WITH DIFFERENTIAL/PLATELET
BASOS ABS: 0.1 10*3/uL (ref 0.0–0.1)
BASOS PCT: 1 %
EOS ABS: 0.8 10*3/uL — AB (ref 0.0–0.7)
Eosinophils Relative: 7 %
HEMATOCRIT: 18.8 % — AB (ref 39.0–52.0)
Hemoglobin: 6.4 g/dL — CL (ref 13.0–17.0)
LYMPHS ABS: 2.1 10*3/uL (ref 0.7–4.0)
LYMPHS PCT: 18 %
MCH: 30.6 pg (ref 26.0–34.0)
MCHC: 34 g/dL (ref 30.0–36.0)
MCV: 90 fL (ref 78.0–100.0)
Monocytes Absolute: 2.1 10*3/uL — ABNORMAL HIGH (ref 0.1–1.0)
Monocytes Relative: 18 %
NEUTROS ABS: 6.5 10*3/uL (ref 1.7–7.7)
NEUTROS PCT: 56 %
PLATELETS: 425 10*3/uL — AB (ref 150–400)
RBC: 2.09 MIL/uL — ABNORMAL LOW (ref 4.22–5.81)
RDW: 21.3 % — ABNORMAL HIGH (ref 11.5–15.5)
WBC: 11.5 10*3/uL — AB (ref 4.0–10.5)

## 2016-04-22 LAB — PARVOVIRUS B19 ANTIBODY, IGG AND IGM
Parovirus B19 IgG Abs: 0.5 index (ref 0.0–0.8)
Parovirus B19 IgM Abs: 0.3 index (ref 0.0–0.8)

## 2016-04-22 LAB — ERYTHROPOIETIN: Erythropoietin: 52.2 m[IU]/mL — ABNORMAL HIGH (ref 2.6–18.5)

## 2016-04-22 MED ORDER — HYDROMORPHONE 1 MG/ML IV SOLN
INTRAVENOUS | Status: DC
  Administered 2016-04-22: 10.2 mg via INTRAVENOUS
  Administered 2016-04-22: 4.5 mg via INTRAVENOUS
  Administered 2016-04-22: 14:00:00 via INTRAVENOUS
  Administered 2016-04-23: 5.5 mg via INTRAVENOUS
  Administered 2016-04-23: 9 mg via INTRAVENOUS
  Administered 2016-04-23: 2.9 mg via INTRAVENOUS
  Administered 2016-04-23: 9 mg via INTRAVENOUS
  Administered 2016-04-23: 23:00:00 via INTRAVENOUS
  Administered 2016-04-23: 5 mg via INTRAVENOUS
  Administered 2016-04-23: 03:00:00 via INTRAVENOUS
  Administered 2016-04-23: 8 mg via INTRAVENOUS
  Administered 2016-04-23: 1.5 mg via INTRAVENOUS
  Administered 2016-04-24: 9.5 mg via INTRAVENOUS
  Administered 2016-04-24: 1 mg via INTRAVENOUS
  Filled 2016-04-22 (×3): qty 25

## 2016-04-22 NOTE — Progress Notes (Signed)
CRITICAL VALUE ALERT  Critical value received:   Hgb 6.4  Date of notification: 04/22/16  Time of notification:  0425  Critical value read back:Yes.    Nurse who received alert:  Thana Farr  MD notified (1st page):  Raliegh Ip Schorr  Time of first page:  0430  MD notified (2nd page):  Time of second page:  Responding MD:  [name]  Time MD responded:  [phone]

## 2016-04-22 NOTE — Progress Notes (Signed)
Care was resumed for this patient at 1530 from [facility]. Agree with previous assessment.

## 2016-04-22 NOTE — Progress Notes (Signed)
On call notified re: Critical value Hgb 6.4. On call stated that Dr. Zigmund Daniel would address the issue today.

## 2016-04-22 NOTE — Progress Notes (Signed)
SICKLE CELL SERVICE PROGRESS NOTE  [name] GGE:[id number] DOB: 09-18-1980 DOA: 04/09/2016 PCP: [name], MD  Assessment/Plan: Principal Problem:   Sickle cell pain crisis ([place]) Active Problems:   Pulmonary HTN   Cor pulmonale, chronic (HCC)   Chronic anticoagulation   Anemia of chronic disease   Acute on chronic respiratory failure with hypoxia (HCC)   Hx of pulmonary embolus   Essential hypertension   Troponin level elevated   Prolonged Q-T interval on ECG   Acute pulmonary edema ([place])  1. Reticulocytopenia: [name] continues to have a decreased reticulocyte count in a setting of anemia and decreasing Hb. Erythropoietin levels normal as is folate. I discussed the case yesterday with Dr. Jana Hakim from Hematology and will update him on current condition for a possible official consult and/consideration of BM biopsy. Parvo-[id number] pending.  2. Acute Pulmonary Edema with Hypoxia: Oxygenation improved. Will continue to wean Oxygen to maintain saturations > 87%.  3. Acute on Chronic Respiratory Failure with Hypoxia: [name] has been weaned to 4 L/min of Oxygen. Will continue to wean Oxygen for saturations > 87%. Will check CXR today. Check ABG on 4 L/min of Oxygen.   4. Anemia of Chronic Disease: Hb today decreased to 6.4 from 7.9 yesterday.  I anticipate that he will require another transfusion due to reticulocytopenia.  5. Hb SS with crisis: Continue to wean PCA to 0.5 mg and 1 hour of 3 mg. Continue  Ibuprofen. Check renal function tomorrow.   6. Leukocytosis: At baseline. Likely related to crisis. No clinical evidence of infection.  7. Chronic pain: Continue MS Contin 8. Chronic Anticoagulation: [name] on Xarelto for recurrent PE's. Continue Xarelto. [name] reports that he has missed 1 dose of Xarelto. However I do not feel that this one dose has conferred an increased risk of PE. Nevertheless the treatment would be to continue Xarelto.  9. Benign HTN: BP will controlled with current  medications except with pain when they become elevated with pain.  Code Status: Full Code Family Communication: N/A Disposition Plan: Not yet ready for discharge.   Tasmine Hipwell A.  Pager [phone]. If 7PM-7AM, please contact night-coverage.  04/22/2016, 1:04 PM  LOS: 12 days   Interim History: [name] awake and alert. He reports that he has no more Abrisentan at home. He has no new complaints at this time. He ambulated without difficulty and required 6 l/min during ambulation.    Review of Records:  2-D ECHO ([facility]-01/06/16): Summary Technically adequate study Dilated right atrium and right ventricle with abnormal right ventricular systolic function consistent with acute strain on the right ventricle such as pulmonary embolus Other chambers appear structurally normal Normal LV systolic function , flattened intraventricular septum consistent with pressure overload on the right side, normal LV systolic ejection fraction calculated at 68% Valves appear structurally normal mild mitral regurgitation Moderate to severe tricuspid regurgitation with moderate pulmonary hypertension right ventricular systolic pressure estimated of 60 mmHg No pericardial effusion The findings are consistent with acute/ recent pulmonary embolus Signature   Consultants:  None  Procedures:  None  Antibiotics:  None  Objective: Vitals:   04/22/16 0518 04/22/16 0730 04/22/16 1043 04/22/16 1146  BP: 117/65  108/70   Pulse: 85  72   Resp: 18 13 15 15   Temp: 98 F (36.7 C)  98.3 F (36.8 C)   TempSrc: Oral  Oral   SpO2: 90% 94% 93% 95%  Weight: 86.9 kg (191 lb 8 oz)     Height:  Weight change: 2.268 kg (5 lb)  Intake/Output Summary (Last 24 hours) at 04/22/16 1304 Last data filed at 04/22/16 1042  Gross per 24 hour  Intake           576.26 ml  Output             1725 ml  Net         -1148.74 ml    General: Alert, awake, oriented x3, in no apparent distress. HEENT:  [place]/AT PEERL, EOMI, anicteric.  Heart: Regular rate and rhythm, II/VI systolic ejection murmurs at base, no rubs or gallops, PMI non-displaced, no heaves or thrills on palpation.  Lungs: Clear to auscultation except at b/l bases where there are mild crackles. No wheezing or rhonchi noted. No increased vocal fremitus resonant to percussion  Abdomen: Soft, nontender, nondistended, positive bowel sounds, no masses no hepatosplenomegaly noted.  Neuro: No focal neurological deficits noted cranial nerves II through XII grossly intact. . Strength at functional baseline in bilateral upper and lower extremities. Musculoskeletal: No warmth swelling or erythema around joints, no spinal tenderness noted. Psychiatric: Patient alert and oriented x3, good insight and cognition, good recent to remote recall.    Data Reviewed: Basic Metabolic Panel:  Recent Labs Lab 04/16/16 0810 04/18/16 0750 04/19/16 0635  NA 138 142 140  K 3.5 3.5 3.7  CL 108 105 104  CO2 25 30 30   GLUCOSE 288* 131* 125*  BUN 23* 24* 25*  CREATININE 1.36* 0.87 0.90  CALCIUM 7.6* 9.0 9.2   Liver Function Tests:  Recent Labs Lab 04/16/16 0810 04/18/16 0750 04/19/16 0635  AST 44* 55* 60*  ALT 27 33 35  ALKPHOS 89 124 103  BILITOT 2.7* 3.0* 3.3*  PROT 6.1* 7.4 7.6  ALBUMIN 3.1* 4.0 3.9   No results for input(s): LIPASE, AMYLASE in the last 168 hours. No results for input(s): AMMONIA in the last 168 hours. CBC:  Recent Labs Lab 04/17/16 0845 04/18/16 0750 04/19/16 0635 04/21/16 1143 04/22/16 0425  WBC 9.0 10.7* 12.4* 13.1* 11.5*  NEUTROABS 4.4 5.9 7.0 8.0* 6.5  HGB 7.9* 7.6* 7.9* 7.7* 6.4*  HCT 23.1* 21.8* 22.7* 22.5* 18.8*  MCV 85.6 87.6 89.7 90.4 90.0  PLT 491* 461* 512* 536* 425*   Cardiac Enzymes: No results for input(s): CKTOTAL, CKMB, CKMBINDEX, TROPONINI in the last 168 hours. BNP (last 3 results)  Recent Labs  04/13/16 1015 04/14/16 0500 04/21/16 1139  BNP 364.2* 315.7* 321.8*    ProBNP  (last 3 results) No results for input(s): PROBNP in the last 8760 hours.  CBG: No results for input(s): GLUCAP in the last 168 hours.  No results found for this or any previous visit (from the past 240 hour(s)).   Studies: Dg Chest 2 View  Result Date: 04/13/2016 CLINICAL DATA:  History of sickle cell disease. Patient admitted 04/10/2016 with chest pain. Hypoxia and pulmonary edema. EXAM: CHEST  2 VIEW COMPARISON:  PA and lateral chest 04/09/2016 and 01/31/2016. Single-view of the chest 04/11/2016. FINDINGS: Marked cardiomegaly is again seen. There is mild interstitial edema which appears unchanged since yesterday's examination. No pneumothorax or pleural effusion. No consolidative process. Port-A-Cath is in place. Avascular necrosis left humeral head is noted. IMPRESSION: No change in mild interstitial edema in patient with marked cardiomegaly. Electronically Signed   By: Inge Rise M.D.   On: 04/13/2016 10:54   Dg Chest 2 View  Result Date: 04/10/2016 CLINICAL DATA:  Bilateral chest wall pain. Sickle cell disease. Pain began yesterday. EXAM: CHEST  2 VIEW COMPARISON:  03/27/2016 FINDINGS: Left subclavian port extends to the low SVC. Unchanged cardiomegaly. No airspace consolidation. No pleural effusions. Pulmonary vasculature is normal. Hilar and mediastinal contours are unremarkable and unchanged. IMPRESSION: Unchanged cardiomegaly.  No consolidation or effusion. Electronically Signed   By: Andreas Newport M.D.   On: 04/10/2016 00:50   Dg Chest 2 View  Result Date: 03/27/2016 CLINICAL DATA:  Left chest pain.  Sickle cell anemia EXAM: CHEST  2 VIEW COMPARISON:  03/22/2016 FINDINGS: Cardiac enlargement with vascular congestion. Negative for edema or effusion. Negative for pneumonia. Port-A-Cath tip in the SVC. IMPRESSION: Cardiac enlargement with pulmonary vascular congestion. Negative for pneumonia or edema. Electronically Signed   By: Franchot Gallo M.D.   On: 03/27/2016 16:02   Dg  Chest Port 1 View  Result Date: 04/17/2016 CLINICAL DATA:  Lethargy.  Shortness of breath. EXAM: PORTABLE CHEST 1 VIEW COMPARISON:  None. FINDINGS: Stable enlarged cardiac silhouette. Increased density at the left lateral lung base. Interval mild patchy and linear opacity at the right lung base. Left subclavian porta catheter tip in the right atrium. No acute bony abnormality. 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 IMPRESSION: 1. Interval atelectasis or pneumonia at the left lateral lung base. 2. Interval mild atelectasis and possible pneumonia at the right lung base. 3. Stable cardiomegaly. 4. Left subclavian porta catheter tip in the right atrium. This could be retracted 4.5 cm to place it in the superior vena cava. Electronically Signed   By: Claudie Revering M.D.   On: 04/17/2016 09:06   Dg Chest Port 1 View  Result Date: 04/11/2016 CLINICAL DATA:  Initial evaluation for sickle cell pain crisis. Worsened shortness of breath. EXAM: PORTABLE CHEST 1 VIEW COMPARISON:  Prior radiograph from 04/09/2016. FINDINGS: Left-sided Port-A-Cath in place, stable. Severe cardiomegaly not significantly changed. Mediastinal silhouette within normal limits. Lungs mildly hypoinflated. There is slightly worsened pulmonary vascular congestion as compared to previous. Slightly worsened right infrahilar opacity, which may reflect developing infiltrate and/or atelectasis. More linear opacities at the bilateral lung bases most consistent with atelectasis. No pleural effusion. No pneumothorax. No acute osseous abnormality. IMPRESSION: 1. Severe cardiomegaly with slightly worsened diffuse pulmonary vascular congestion as compared to 04/09/16, which may reflect developing pulmonary edema. 2. Slightly patchy right infrahilar opacity, which may reflect congestion and/or developing infiltrate. 3. Mildly increased superimposed bibasilar atelectasis. Electronically Signed   By: Jeannine Boga M.D.   On: 04/11/2016  06:10    Scheduled Meds: . ambrisentan  5 mg Oral Daily  . aspirin  81 mg Oral Daily  . carvedilol  3.125 mg Oral BID WC  . Chlorhexidine Gluconate Cloth  6 each Topical Q0600  . cholecalciferol  2,000 Units Oral Daily  . folic acid  2 mg Oral Daily  . gabapentin  300 mg Oral TID  . HYDROmorphone   Intravenous Q4H  . HYDROmorphone  6 mg Oral Q4H  . L-glutamine  15 g Oral BID  . mouth rinse  15 mL Mouth Rinse BID  . morphine  30 mg Oral Q12H  . rivaroxaban  20 mg Oral Q supper  . senna-docusate  1 tablet Oral BID  . torsemide  20 mg Oral Daily   Continuous Infusions:   Principal Problem:   Sickle cell pain crisis (HCC) Active Problems:   Pulmonary HTN   Cor pulmonale, chronic (HCC)   Chronic anticoagulation   Anemia of chronic disease   Acute on chronic respiratory failure with hypoxia ([place])  Hx of pulmonary embolus   Essential hypertension   Troponin level elevated   Prolonged Q-T interval on ECG   Acute pulmonary edema (HCC)   In excess of 25 minutes spent during this visit. Greater than 50% involved face to face contact with the patient for assessment, counseling and coordination of care.

## 2016-04-22 NOTE — Care Management Note (Signed)
Case Management Note  Patient Details  Name: AYDIEN MAJETTE MRN: [id number] Date of Birth: Jun 20, 1980  Subjective/Objective:  presents complaining of generalized pain that is consistent with sickle cell anemia.                  Action/Plan:  Plan to discharge home   Expected Discharge Date:    04/29/16              Expected Discharge Plan:     In-House Referral:  NA  Discharge planning Services  CM Consult  Post Acute Care Choice:  NA Choice offered to:  NA  DME Arranged:    DME Agency:     HH Arranged:    HH Agency:     Status of Service:  In process, will continue to follow  If discussed at Long Length of Stay Meetings, dates discussed:    Additional CommentsPurcell Mouton, RN 04/22/2016, 3:56 PM

## 2016-04-23 LAB — CBC
HEMATOCRIT: 22.2 % — AB (ref 39.0–52.0)
Hemoglobin: 7.3 g/dL — ABNORMAL LOW (ref 13.0–17.0)
MCH: 29.6 pg (ref 26.0–34.0)
MCHC: 32.9 g/dL (ref 30.0–36.0)
MCV: 89.9 fL (ref 78.0–100.0)
Platelets: 500 10*3/uL — ABNORMAL HIGH (ref 150–400)
RBC: 2.47 MIL/uL — AB (ref 4.22–5.81)
RDW: 21.5 % — ABNORMAL HIGH (ref 11.5–15.5)
WBC: 16.1 10*3/uL — AB (ref 4.0–10.5)

## 2016-04-23 LAB — METHYLMALONIC ACID, SERUM: Methylmalonic Acid, Quantitative: 374 nmol/L (ref 0–378)

## 2016-04-23 MED ORDER — DIPHENHYDRAMINE HCL 50 MG/ML IJ SOLN
INTRAMUSCULAR | Status: AC
  Filled 2016-04-23: qty 1

## 2016-04-23 NOTE — Progress Notes (Signed)
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 Reticulocytopenia: Patient has been stable with low retic count. We will recheck and if still not adequate erythropoisis, will need BM biopsy. Parvo-B19 pending.  2. Acute Pulmonary Edema with Hypoxia: Stable.  No new complaints. On home oxygen at the right levels. Will continue to wean Oxygen to maintain saturations > 87%.  3. Acute on Chronic Respiratory Failure with Hypoxia: Pt has been weaned to 4 L/min of Oxygen. Will continue to wean Oxygen for saturations > 87%. Will check CXR today. Check ABG on 4 L/min of Oxygen.   4. Anemia of Chronic Disease: Hb today decreased to 6.4 from 7.9 yesterday.  I anticipate that he will require another transfusion due to reticulocytopenia.  5. Hb SS with crisis: Continue to wean PCA to 0.5 mg and 1 hour of 3 mg. Continue  Ibuprofen. Check renal function tomorrow.   6. Leukocytosis: At baseline. Likely related to crisis. No clinical evidence of infection.  7. Chronic pain: Continue MS Contin 8. Chronic Anticoagulation: Pt on Xarelto for recurrent PE's. Continue Xarelto. Pt reports that he has missed 1 dose of Xarelto. However I do not feel that this one dose has conferred an increased risk of PE. Nevertheless the treatment would be to continue Xarelto.  9. Benign HTN: BP will controlled with current medications except with pain when they become elevated with pain.  Code Status: Full Code Family Communication: N/A Disposition Plan: Not yet  ready for discharge.   [facility]  Pager [phone]. If 7PM-7AM, please contact night-coverage.  04/23/2016, 4:31 PM  LOS: 13 days   Interim History: Pt awake and alert. He reports that he has no more Abrisentan at home. He has no new complaints at this time. He ambulated without difficulty and required 6 l/min during ambulation.    Review of Records:  2-D ECHO ([facility]-01/06/16): Summary Technically adequate study Dilated right atrium and right ventricle with abnormal right ventricular systolic function consistent with acute strain on the right ventricle such as pulmonary embolus Other chambers appear structurally normal Normal LV systolic function , flattened intraventricular septum consistent with pressure overload on the right side, normal LV systolic ejection fraction calculated at 68% Valves appear structurally normal mild mitral regurgitation Moderate to severe tricuspid regurgitation with moderate pulmonary hypertension right ventricular systolic pressure estimated of 60 mmHg No pericardial effusion The findings are consistent with acute/ recent pulmonary embolus Signature   Consultants:  None  Procedures:  None  Antibiotics:  None  Objective: Vitals:   04/23/16 0800 04/23/16 1000 04/23/16 1200 04/23/16 1342  BP:  120/67  108/63  Pulse:  88  83  Resp: 14 16 13 16   Temp:  97.9 F (36.6 C)  98.3 F (36.8 C)  TempSrc:  Oral  Oral  SpO2: 92% 95% 94% 92%  Weight:      Height:       Weight change: -0.664 kg (-1 lb 7.4 oz)  Intake/Output Summary (Last 24 hours) at 04/23/16 1631 Last data filed  at 04/23/16 1419  Gross per 24 hour  Intake              720 ml  Output             2225 ml  Net            -1505 ml    General: Alert, awake, oriented x3, in no apparent distress. HEENT: [place]/AT PEERL, EOMI, anicteric.  Heart: Regular rate and rhythm, II/VI systolic ejection murmurs at base, no rubs or gallops, PMI non-displaced, no heaves or thrills on  palpation.  Lungs: Clear to auscultation except at b/l bases where there are mild crackles. No wheezing or rhonchi noted. No increased vocal fremitus resonant to percussion  Abdomen: Soft, nontender, nondistended, positive bowel sounds, no masses no hepatosplenomegaly noted.  Neuro: No focal neurological deficits noted cranial nerves II through XII grossly intact. . Strength at functional baseline in bilateral upper and lower extremities. Musculoskeletal: No warmth swelling or erythema around joints, no spinal tenderness noted. Psychiatric: Patient alert and oriented x3, good insight and cognition, good recent to remote recall.    Data Reviewed: Basic Metabolic Panel:  Recent Labs Lab 04/18/16 0750 04/19/16 0635  NA 142 140  K 3.5 3.7  CL 105 104  CO2 30 30  GLUCOSE 131* 125*  BUN 24* 25*  CREATININE 0.87 0.90  CALCIUM 9.0 9.2   Liver Function Tests:  Recent Labs Lab 04/18/16 0750 04/19/16 0635  AST 55* 60*  ALT 33 35  ALKPHOS 124 103  BILITOT 3.0* 3.3*  PROT 7.4 7.6  ALBUMIN 4.0 3.9   No results for input(s): LIPASE, AMYLASE in the last 168 hours. No results for input(s): AMMONIA in the last 168 hours. CBC:  Recent Labs Lab 04/17/16 0845 04/18/16 0750 04/19/16 0635 04/21/16 1143 04/22/16 0425 04/23/16 0530  WBC 9.0 10.7* 12.4* 13.1* 11.5* 16.1*  NEUTROABS 4.4 5.9 7.0 8.0* 6.5  --   HGB 7.9* 7.6* 7.9* 7.7* 6.4* 7.3*  HCT 23.1* 21.8* 22.7* 22.5* 18.8* 22.2*  MCV 85.6 87.6 89.7 90.4 90.0 89.9  PLT 491* 461* 512* 536* 425* 500*   Cardiac Enzymes: No results for input(s): CKTOTAL, CKMB, CKMBINDEX, TROPONINI in the last 168 hours. BNP (last 3 results)  Recent Labs  04/13/16 1015 04/14/16 0500 04/21/16 1139  BNP 364.2* 315.7* 321.8*    ProBNP (last 3 results) No results for input(s): PROBNP in the last 8760 hours.  CBG: No results for input(s): GLUCAP in the last 168 hours.  No results found for this or any previous visit (from the past 240  hour(s)).   Studies: Dg Chest 2 View  Result Date: 04/22/2016 CLINICAL DATA:  Sickle cell pain crisis, shortness of breath today. History of hypertension. EXAM: CHEST  2 VIEW COMPARISON:  Chest x-rays dated 04/17/2016 and 02/11/2016. FINDINGS: Cardiomegaly is stable. There is central pulmonary vascular congestion. Prominent interstitial markings again noted bilaterally suggesting associated interstitial edema. No evidence of overt alveolar pulmonary edema. No confluent opacity to suggest a developing pneumonia. No pleural effusion or pneumothorax seen. Left chest wall Port-A-Cath is stable in position with tip at the level of the lower SVC. No acute or suspicious osseous finding. IMPRESSION: Cardiomegaly with central pulmonary vascular congestion and bilateral interstitial edema suggesting volume overload/CHF. No evidence of pneumonia. Electronically Signed   By: Franki Cabot M.D.   On: 04/22/2016 14:14   Dg Chest 2 View  Result Date: 04/13/2016 CLINICAL DATA:  History of sickle cell disease. Patient admitted 04/10/2016  with chest pain. Hypoxia and pulmonary edema. EXAM: CHEST  2 VIEW COMPARISON:  PA and lateral chest 04/09/2016 and 01/31/2016. Single-view of the chest 04/11/2016. FINDINGS: Marked cardiomegaly is again seen. There is mild interstitial edema which appears unchanged since yesterday's examination. No pneumothorax or pleural effusion. No consolidative process. Port-A-Cath is in place. Avascular necrosis left humeral head is noted. IMPRESSION: No change in mild interstitial edema in patient with marked cardiomegaly. Electronically Signed   By: Inge Rise M.D.   On: 04/13/2016 10:54   Dg Chest 2 View  Result Date: 04/10/2016 CLINICAL DATA:  Bilateral chest wall pain. Sickle cell disease. Pain began yesterday. EXAM: CHEST  2 VIEW COMPARISON:  03/27/2016 FINDINGS: Left subclavian port extends to the low SVC. Unchanged cardiomegaly. No airspace consolidation. No pleural effusions.  Pulmonary vasculature is normal. Hilar and mediastinal contours are unremarkable and unchanged. IMPRESSION: Unchanged cardiomegaly.  No consolidation or effusion. Electronically Signed   By: Andreas Newport M.D.   On: 04/10/2016 00:50   Dg Chest 2 View  Result Date: 03/27/2016 CLINICAL DATA:  Left chest pain.  Sickle cell anemia EXAM: CHEST  2 VIEW COMPARISON:  03/22/2016 FINDINGS: Cardiac enlargement with vascular congestion. Negative for edema or effusion. Negative for pneumonia. Port-A-Cath tip in the SVC. IMPRESSION: Cardiac enlargement with pulmonary vascular congestion. Negative for pneumonia or edema. Electronically Signed   By: Franchot Gallo M.D.   On: 03/27/2016 16:02   Dg Chest Port 1 View  Result Date: 04/17/2016 CLINICAL DATA:  Lethargy.  Shortness of breath. EXAM: PORTABLE CHEST 1 VIEW COMPARISON:  None. FINDINGS: Stable enlarged cardiac silhouette. Increased density at the left lateral lung base. Interval mild patchy and linear opacity at the right lung base. Left subclavian porta catheter tip in the right atrium. No acute bony abnormality. 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 IMPRESSION: 1. Interval atelectasis or pneumonia at the left lateral lung base. 2. Interval mild atelectasis and possible pneumonia at the right lung base. 3. Stable cardiomegaly. 4. Left subclavian porta catheter tip in the right atrium. This could be retracted 4.5 cm to place it in the superior vena cava. Electronically Signed   By: Claudie Revering M.D.   On: 04/17/2016 09:06   Dg Chest Port 1 View  Result Date: 04/11/2016 CLINICAL DATA:  Initial evaluation for sickle cell pain crisis. Worsened shortness of breath. EXAM: PORTABLE CHEST 1 VIEW COMPARISON:  Prior radiograph from 04/09/2016. FINDINGS: Left-sided Port-A-Cath in place, stable. Severe cardiomegaly not significantly changed. Mediastinal silhouette within normal limits. Lungs mildly hypoinflated. There is slightly worsened pulmonary  vascular congestion as compared to previous. Slightly worsened right infrahilar opacity, which may reflect developing infiltrate and/or atelectasis. More linear opacities at the bilateral lung bases most consistent with atelectasis. No pleural effusion. No pneumothorax. No acute osseous abnormality. IMPRESSION: 1. Severe cardiomegaly with slightly worsened diffuse pulmonary vascular congestion as compared to 04/09/16, which may reflect developing pulmonary edema. 2. Slightly patchy right infrahilar opacity, which may reflect congestion and/or developing infiltrate. 3. Mildly increased superimposed bibasilar atelectasis. Electronically Signed   By: Jeannine Boga M.D.   On: 04/11/2016 06:10    Scheduled Meds: . ambrisentan  5 mg Oral Daily  . aspirin  81 mg Oral Daily  . carvedilol  3.125 mg Oral BID WC  . Chlorhexidine Gluconate Cloth  6 each Topical Q0600  . cholecalciferol  2,000 Units Oral Daily  . folic acid  2 mg Oral Daily  . gabapentin  300 mg Oral TID  .  HYDROmorphone   Intravenous Q4H  . HYDROmorphone  6 mg Oral Q4H  . L-glutamine  15 g Oral BID  . mouth rinse  15 mL Mouth Rinse BID  . morphine  30 mg Oral Q12H  . rivaroxaban  20 mg Oral Q supper  . senna-docusate  1 tablet Oral BID  . torsemide  20 mg Oral Daily   Continuous Infusions:   Principal Problem:   Sickle cell pain crisis (HCC) Active Problems:   Hx of pulmonary embolus   Pulmonary HTN   Chronic anticoagulation   Essential hypertension   Anemia of chronic disease   Cor pulmonale, chronic (HCC)   Troponin level elevated   Prolonged Q-T interval on ECG   Acute on chronic respiratory failure with hypoxia (HCC)   Acute pulmonary edema (HCC)   In excess of 25 minutes spent during this visit. Greater than 50% involved face to face contact with the patient for assessment, counseling and coordination of care.

## 2016-04-24 LAB — COMPREHENSIVE METABOLIC PANEL
ALBUMIN: 4.1 g/dL (ref 3.5–5.0)
ALT: 51 U/L (ref 17–63)
AST: 67 U/L — AB (ref 15–41)
Alkaline Phosphatase: 119 U/L (ref 38–126)
Anion gap: 6 (ref 5–15)
BUN: 24 mg/dL — ABNORMAL HIGH (ref 6–20)
CALCIUM: 9.1 mg/dL (ref 8.9–10.3)
CHLORIDE: 105 mmol/L (ref 101–111)
CO2: 30 mmol/L (ref 22–32)
Creatinine, Ser: 1.04 mg/dL (ref 0.61–1.24)
GFR calc Af Amer: >60 mL/min (ref >60)
GLUCOSE: 126 mg/dL — AB (ref 65–99)
POTASSIUM: 3.7 mmol/L (ref 3.5–5.1)
Sodium: 141 mmol/L (ref 135–145)
Total Bilirubin: 3.5 mg/dL — ABNORMAL HIGH (ref 0.3–1.2)
Total Protein: 7.6 g/dL (ref 6.5–8.1)

## 2016-04-24 LAB — CBC WITH DIFFERENTIAL/PLATELET
BASOS ABS: 0.1 10*3/uL (ref 0.0–0.1)
Basophils Relative: 1 %
EOS PCT: 5 %
Eosinophils Absolute: 0.7 10*3/uL (ref 0.0–0.7)
HEMATOCRIT: 22.1 % — AB (ref 39.0–52.0)
HEMOGLOBIN: 7.3 g/dL — AB (ref 13.0–17.0)
LYMPHS ABS: 3 10*3/uL (ref 0.7–4.0)
LYMPHS PCT: 20 %
MCH: 29.4 pg (ref 26.0–34.0)
MCHC: 33 g/dL (ref 30.0–36.0)
MCV: 89.1 fL (ref 78.0–100.0)
MONOS PCT: 16 %
Monocytes Absolute: 2.4 10*3/uL — ABNORMAL HIGH (ref 0.1–1.0)
Neutro Abs: 8.7 10*3/uL — ABNORMAL HIGH (ref 1.7–7.7)
Neutrophils Relative %: 58 %
Platelets: 488 10*3/uL — ABNORMAL HIGH (ref 150–400)
RBC: 2.48 MIL/uL — AB (ref 4.22–5.81)
RDW: 21.7 % — AB (ref 11.5–15.5)
WBC: 14.9 10*3/uL — ABNORMAL HIGH (ref 4.0–10.5)

## 2016-04-24 LAB — RETICULOCYTES
RBC.: 2.48 MIL/uL — AB (ref 4.22–5.81)
RETIC COUNT ABSOLUTE: 210.8 10*3/uL — AB (ref 19.0–186.0)
Retic Ct Pct: 8.5 % — ABNORMAL HIGH (ref 0.4–3.1)

## 2016-04-24 MED ORDER — HYDROMORPHONE 1 MG/ML IV SOLN
INTRAVENOUS | Status: DC
  Administered 2016-04-24: 7 mg via INTRAVENOUS
  Administered 2016-04-24: 4 mg via INTRAVENOUS
  Administered 2016-04-24: 11 mg via INTRAVENOUS
  Administered 2016-04-25: 8 mg via INTRAVENOUS
  Administered 2016-04-25: 7.5 mg via INTRAVENOUS
  Administered 2016-04-25: 10:00:00 via INTRAVENOUS
  Administered 2016-04-25: 1 mg via INTRAVENOUS
  Administered 2016-04-25: 7 mg via INTRAVENOUS
  Filled 2016-04-24 (×2): qty 25

## 2016-04-24 NOTE — Progress Notes (Signed)
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 Reticulocytopenia: Patient has improved  retic count from 94.1 to 210. He appears to be responding.  Parvo-B19 pending.  2. Acute Pulmonary Edema with Hypoxia: Resolved. Continue oxygen which is chronic for patient.  3. Acute on Chronic Respiratory Failure with Hypoxia: Resolved.  Pt has been weaned to 4 L/min of Oxygen. Will continue to wean Oxygen for saturations > 87%.  4. Anemia of Chronic Disease: Hb has remained stable at 7.3g. Continue to monitor.  5. Hb SS with crisis: Continue to wean PCA and give some bolus for 2 doses of q 3 hours. Continue  Ibuprofen.  6. Leukocytosis: Improved. Likely related to crisis. No clinical evidence of infection.  7. Chronic pain: Continue MS Contin 8. Chronic Anticoagulation: Pt on Xarelto for recurrent PE's. Continue Xarelto.  9. Benign HTN: BP well controlled with current medications except with pain when they become elevated with pain.  Code Status: Full Code Family Communication: N/A Disposition Plan: Not yet ready for discharge.   [facility]  Pager [phone]. If 7PM-7AM, please contact night-coverage.  04/24/2016, 7:30 PM  LOS: 14 days   Interim History: Pt awake and alert. Still complaining of pain at 9/10. Has been on his dilaudid PCA and Ibuprofen. He has used 34 mg with 68 demands and 68 deliveries in the last 24 hours. No significant SOB.    Review of Records:  2-D ECHO ([facility]-01/06/16): Summary Technically  adequate study Dilated right atrium and right ventricle with abnormal right ventricular systolic function consistent with acute strain on the right ventricle such as pulmonary embolus Other chambers appear structurally normal Normal LV systolic function , flattened intraventricular septum consistent with pressure overload on the right side, normal LV systolic ejection fraction calculated at 68% Valves appear structurally normal mild mitral regurgitation Moderate to severe tricuspid regurgitation with moderate pulmonary hypertension right ventricular systolic pressure estimated of 60 mmHg No pericardial effusion The findings are consistent with acute/ recent pulmonary embolus Signature   Consultants:  None  Procedures:  None  Antibiotics:  None  Objective: Vitals:   04/24/16 1154 04/24/16 1319 04/24/16 1650 04/24/16 1816  BP:  103/67  101/65  Pulse:  71  81  Resp: 15 15 19 15   Temp:  98.8 F (37.1 C)  98.4 F (36.9 C)  TempSrc:  Oral  Oral  SpO2: 93% 92% 92% 91%  Weight:      Height:       Weight change:   Intake/Output Summary (Last 24 hours) at 04/24/16 1930 Last data filed at 04/24/16 1615  Gross per 24 hour  Intake              120 ml  Output             1275 ml  Net            -1155 ml    General: Alert, awake, oriented x3, in no apparent distress. HEENT: /AT PEERL, EOMI, anicteric.  Heart: Regular rate and  rhythm, II/VI systolic ejection murmurs at base, no rubs or gallops, PMI non-displaced, no heaves or thrills on palpation.  Lungs: Clear to auscultation except at b/l bases where there are mild crackles. No wheezing or rhonchi noted. No increased vocal fremitus resonant to percussion  Abdomen: Soft, nontender, nondistended, positive bowel sounds, no masses no hepatosplenomegaly noted.  Neuro: No focal neurological deficits noted cranial nerves II through XII grossly intact. . Strength at functional baseline in bilateral upper and lower  extremities. Musculoskeletal: No warmth swelling or erythema around joints, no spinal tenderness noted. Psychiatric: Patient alert and oriented x3, good insight and cognition, good recent to remote recall.    Data Reviewed: Basic Metabolic Panel:  Recent Labs Lab 04/18/16 0750 04/19/16 0635 04/24/16 0355  NA 142 140 141  K 3.5 3.7 3.7  CL 105 104 105  CO2 30 30 30   GLUCOSE 131* 125* 126*  BUN 24* 25* 24*  CREATININE 0.87 0.90 1.04  CALCIUM 9.0 9.2 9.1   Liver Function Tests:  Recent Labs Lab 04/18/16 0750 04/19/16 0635 04/24/16 0355  AST 55* 60* 67*  ALT 33 35 51  ALKPHOS 124 103 119  BILITOT 3.0* 3.3* 3.5*  PROT 7.4 7.6 7.6  ALBUMIN 4.0 3.9 4.1   No results for input(s): LIPASE, AMYLASE in the last 168 hours. No results for input(s): AMMONIA in the last 168 hours. CBC:  Recent Labs Lab 04/18/16 0750 04/19/16 0635 04/21/16 1143 04/22/16 0425 04/23/16 0530 04/24/16 0355  WBC 10.7* 12.4* 13.1* 11.5* 16.1* 14.9*  NEUTROABS 5.9 7.0 8.0* 6.5  --  8.7*  HGB 7.6* 7.9* 7.7* 6.4* 7.3* 7.3*  HCT 21.8* 22.7* 22.5* 18.8* 22.2* 22.1*  MCV 87.6 89.7 90.4 90.0 89.9 89.1  PLT 461* 512* 536* 425* 500* 488*   Cardiac Enzymes: No results for input(s): CKTOTAL, CKMB, CKMBINDEX, TROPONINI in the last 168 hours. BNP (last 3 results)  Recent Labs  04/13/16 1015 04/14/16 0500 04/21/16 1139  BNP 364.2* 315.7* 321.8*    ProBNP (last 3 results) No results for input(s): PROBNP in the last 8760 hours.  CBG: No results for input(s): GLUCAP in the last 168 hours.  No results found for this or any previous visit (from the past 240 hour(s)).   Studies: Dg Chest 2 View  Result Date: 04/22/2016 CLINICAL DATA:  Sickle cell pain crisis, shortness of breath today. History of hypertension. EXAM: CHEST  2 VIEW COMPARISON:  Chest x-rays dated 04/17/2016 and 02/11/2016. FINDINGS: Cardiomegaly is stable. There is central pulmonary vascular congestion. Prominent interstitial  markings again noted bilaterally suggesting associated interstitial edema. No evidence of overt alveolar pulmonary edema. No confluent opacity to suggest a developing pneumonia. No pleural effusion or pneumothorax seen. Left chest wall Port-A-Cath is stable in position with tip at the level of the lower SVC. No acute or suspicious osseous finding. IMPRESSION: Cardiomegaly with central pulmonary vascular congestion and bilateral interstitial edema suggesting volume overload/CHF. No evidence of pneumonia. Electronically Signed   By: Franki Cabot M.D.   On: 04/22/2016 14:14   Dg Chest 2 View  Result Date: 04/13/2016 CLINICAL DATA:  History of sickle cell disease. Patient admitted 04/10/2016 with chest pain. Hypoxia and pulmonary edema. EXAM: CHEST  2 VIEW COMPARISON:  PA and lateral chest 04/09/2016 and 01/31/2016. Single-view of the chest 04/11/2016. FINDINGS: Marked cardiomegaly is again seen. There is mild interstitial edema which appears unchanged since yesterday's examination. No pneumothorax or pleural effusion. No consolidative process. Port-A-Cath is in place. Avascular necrosis left humeral head  is noted. IMPRESSION: No change in mild interstitial edema in patient with marked cardiomegaly. Electronically Signed   By: Inge Rise M.D.   On: 04/13/2016 10:54   Dg Chest 2 View  Result Date: 04/10/2016 CLINICAL DATA:  Bilateral chest wall pain. Sickle cell disease. Pain began yesterday. EXAM: CHEST  2 VIEW COMPARISON:  03/27/2016 FINDINGS: Left subclavian port extends to the low SVC. Unchanged cardiomegaly. No airspace consolidation. No pleural effusions. Pulmonary vasculature is normal. Hilar and mediastinal contours are unremarkable and unchanged. IMPRESSION: Unchanged cardiomegaly.  No consolidation or effusion. Electronically Signed   By: Andreas Newport M.D.   On: 04/10/2016 00:50   Dg Chest 2 View  Result Date: 03/27/2016 CLINICAL DATA:  Left chest pain.  Sickle cell anemia EXAM: CHEST  2  VIEW COMPARISON:  03/22/2016 FINDINGS: Cardiac enlargement with vascular congestion. Negative for edema or effusion. Negative for pneumonia. Port-A-Cath tip in the SVC. IMPRESSION: Cardiac enlargement with pulmonary vascular congestion. Negative for pneumonia or edema. Electronically Signed   By: Franchot Gallo M.D.   On: 03/27/2016 16:02   Dg Chest Port 1 View  Result Date: 04/17/2016 CLINICAL DATA:  Lethargy.  Shortness of breath. EXAM: PORTABLE CHEST 1 VIEW COMPARISON:  None. FINDINGS: Stable enlarged cardiac silhouette. Increased density at the left lateral lung base. Interval mild patchy and linear opacity at the right lung base. Left subclavian porta catheter tip in the right atrium. No acute bony abnormality. 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 IMPRESSION: 1. Interval atelectasis or pneumonia at the left lateral lung base. 2. Interval mild atelectasis and possible pneumonia at the right lung base. 3. Stable cardiomegaly. 4. Left subclavian porta catheter tip in the right atrium. This could be retracted 4.5 cm to place it in the superior vena cava. Electronically Signed   By: Claudie Revering M.D.   On: 04/17/2016 09:06   Dg Chest Port 1 View  Result Date: 04/11/2016 CLINICAL DATA:  Initial evaluation for sickle cell pain crisis. Worsened shortness of breath. EXAM: PORTABLE CHEST 1 VIEW COMPARISON:  Prior radiograph from 04/09/2016. FINDINGS: Left-sided Port-A-Cath in place, stable. Severe cardiomegaly not significantly changed. Mediastinal silhouette within normal limits. Lungs mildly hypoinflated. There is slightly worsened pulmonary vascular congestion as compared to previous. Slightly worsened right infrahilar opacity, which may reflect developing infiltrate and/or atelectasis. More linear opacities at the bilateral lung bases most consistent with atelectasis. No pleural effusion. No pneumothorax. No acute osseous abnormality. IMPRESSION: 1. Severe cardiomegaly with slightly  worsened diffuse pulmonary vascular congestion as compared to 04/09/16, which may reflect developing pulmonary edema. 2. Slightly patchy right infrahilar opacity, which may reflect congestion and/or developing infiltrate. 3. Mildly increased superimposed bibasilar atelectasis. Electronically Signed   By: Jeannine Boga M.D.   On: 04/11/2016 06:10    Scheduled Meds: . ambrisentan  5 mg Oral Daily  . aspirin  81 mg Oral Daily  . carvedilol  3.125 mg Oral BID WC  . Chlorhexidine Gluconate Cloth  6 each Topical Q0600  . cholecalciferol  2,000 Units Oral Daily  . folic acid  2 mg Oral Daily  . gabapentin  300 mg Oral TID  . HYDROmorphone   Intravenous Q4H  . HYDROmorphone  6 mg Oral Q4H  . L-glutamine  15 g Oral BID  . mouth rinse  15 mL Mouth Rinse BID  . morphine  30 mg Oral Q12H  . rivaroxaban  20 mg Oral Q supper  . senna-docusate  1 tablet Oral BID  .  torsemide  20 mg Oral Daily   Continuous Infusions:   Principal Problem:   Sickle cell pain crisis (HCC) Active Problems:   Hx of pulmonary embolus   Pulmonary HTN   Chronic anticoagulation   Essential hypertension   Anemia of chronic disease   Cor pulmonale, chronic (HCC)   Troponin level elevated   Prolonged Q-T interval on ECG   Acute on chronic respiratory failure with hypoxia (HCC)   Acute pulmonary edema (HCC)   In excess of 25 minutes spent during this visit. Greater than 50% involved face to face contact with the patient for assessment, counseling and coordination of care.

## 2016-04-25 IMAGING — CR DG CHEST 2V
2 series · 2 of 2 positions shown · non-contrast
Comparison: 05/14/2013

CLINICAL DATA: Sickle cell crisis.  Shortness of breath.

EXAM:
CHEST  2 VIEW

[w chest lat]
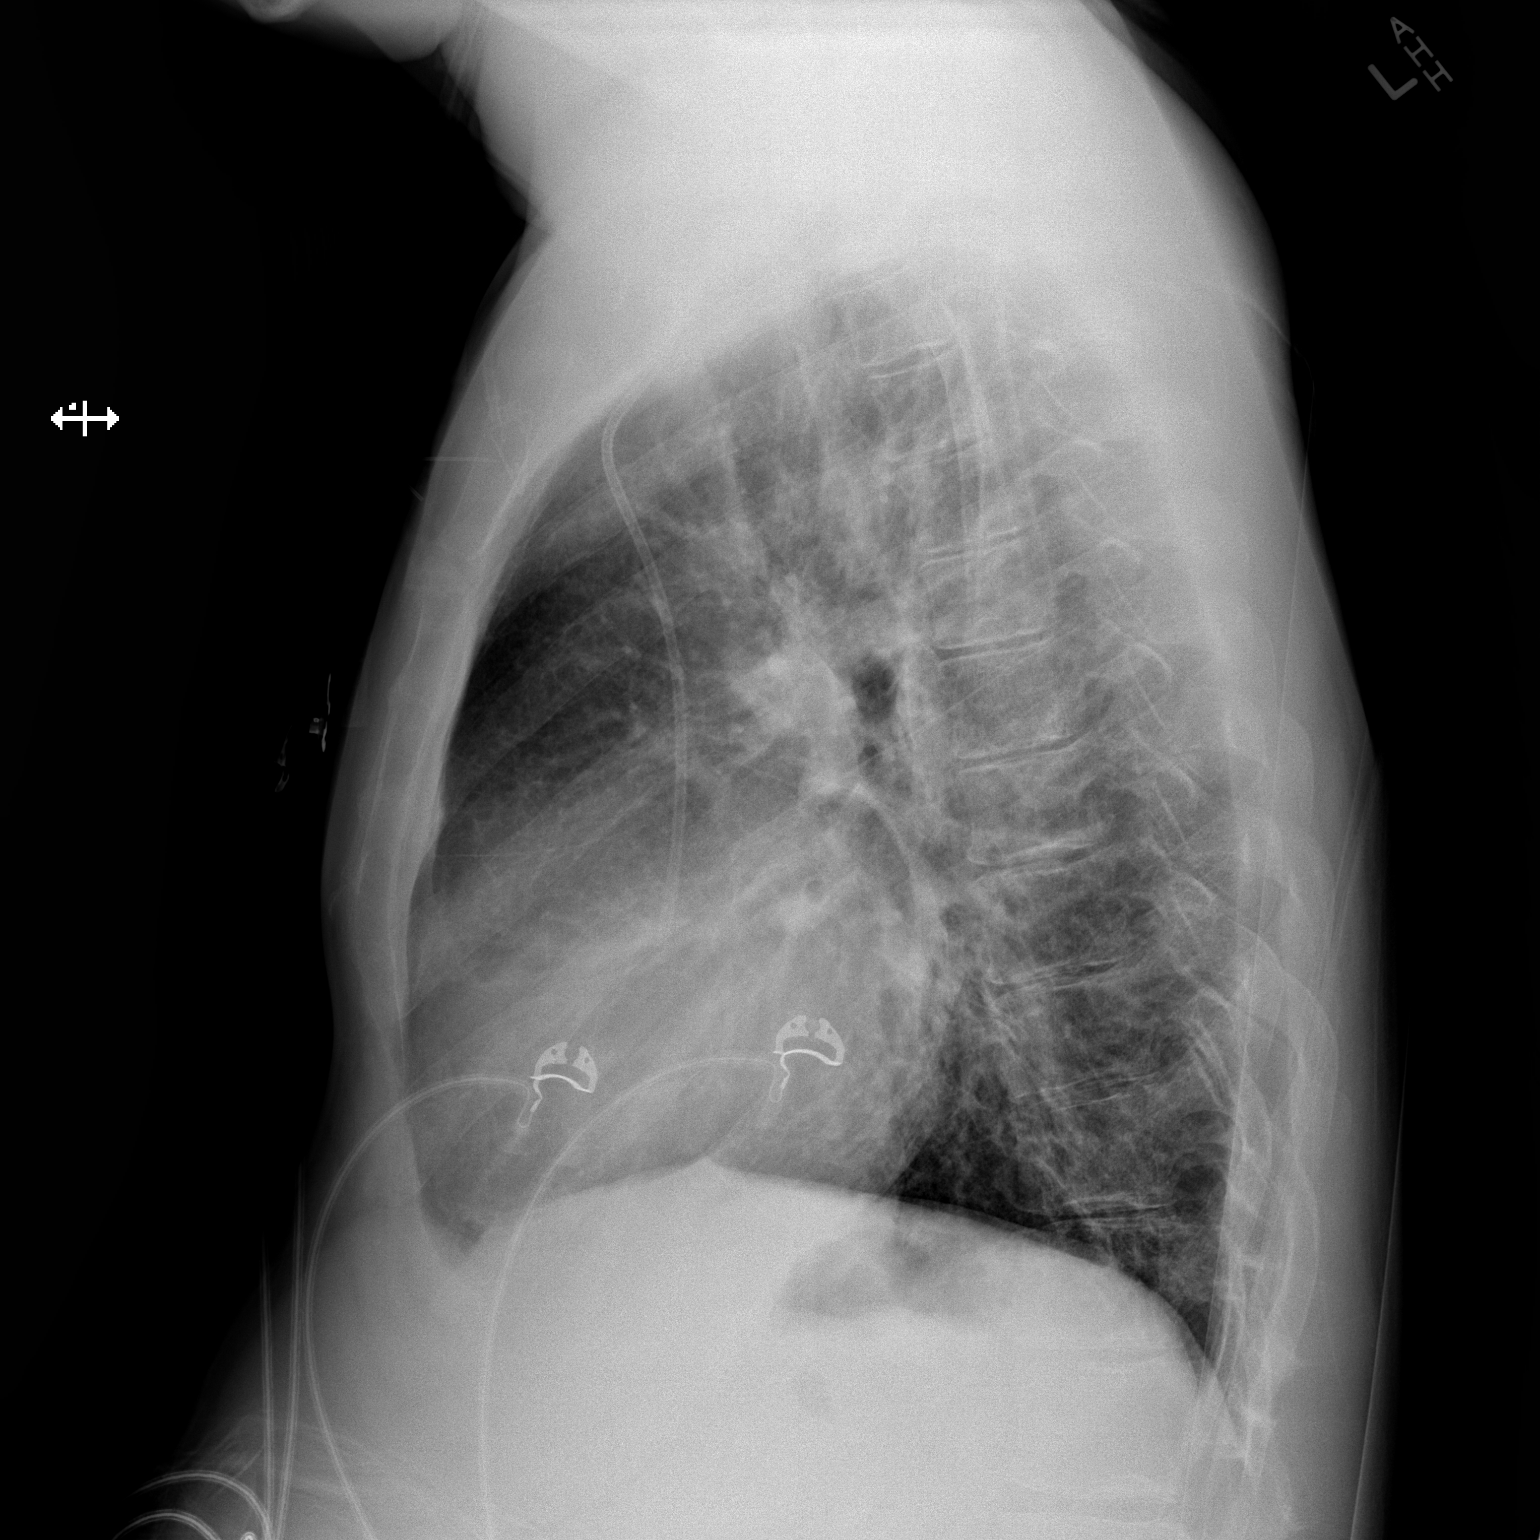

[x chest ap]
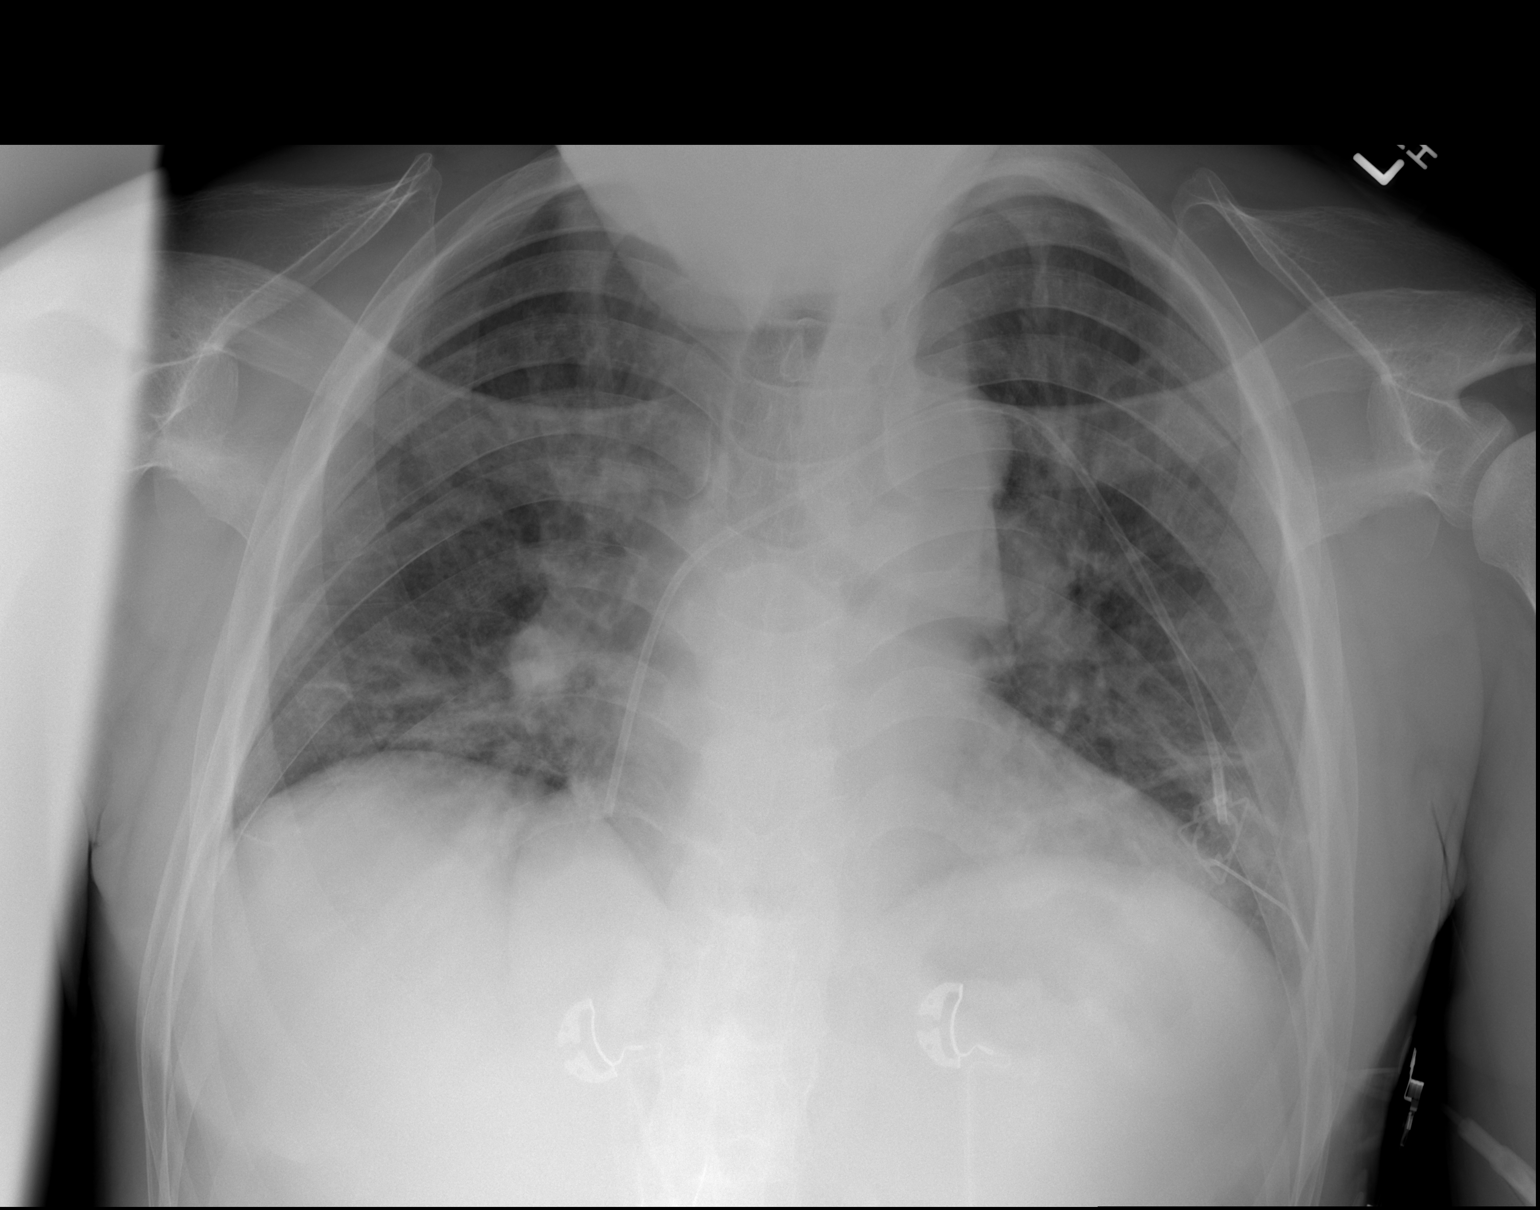

[2 of 2 positions shown; findings below may reference images not displayed]

FINDINGS: Left chest wall port a catheter is noted with tip in the cavoatrial
junction. The heart size is enlarged. The lung volumes are low and
there is scarring within both lung bases. No pleural effusions or
edema. No airspace consolidation.
IMPRESSION: 1. Low lung volumes with bibasilar scarring.  .
2. Cardiac enlargement.

## 2016-04-25 IMAGING — CT CT ANGIO CHEST
1 of 2 series · 17 of 32 positions shown · IV contrast (omnipaque)
Comparison: CT ANGIO CHEST W/CM &/OR WO/CM dated 05/18/2013;

CLINICAL DATA: Shortness of breath, chest pain, hypoxia,
tachycardia, history of sickle cell anemia, evaluate for pulmonary
embolism

EXAM:
CT ANGIOGRAPHY CHEST WITH CONTRAST
TECHNIQUE: Multidetector CT imaging of the chest was performed using the
standard protocol during bolus administration of intravenous
contrast. Multiplanar CT image reconstructions and MIPs were
obtained to evaluate the vascular anatomy.
CONTRAST:  100mL OMNIPAQUE IOHEXOL 350 MG/ML SOLN

[Series 7: thins for pacs · axial · 0.59mm/px · z∈[-239,+3]mm · 17 of 266 slices shown]
[im 12/266  lung]
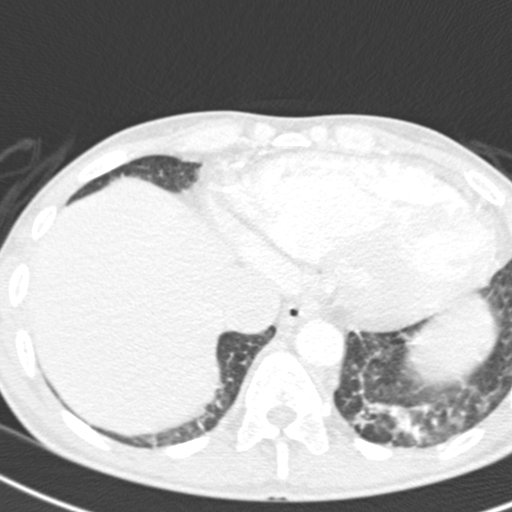
[im 24/266  soft-tissue]
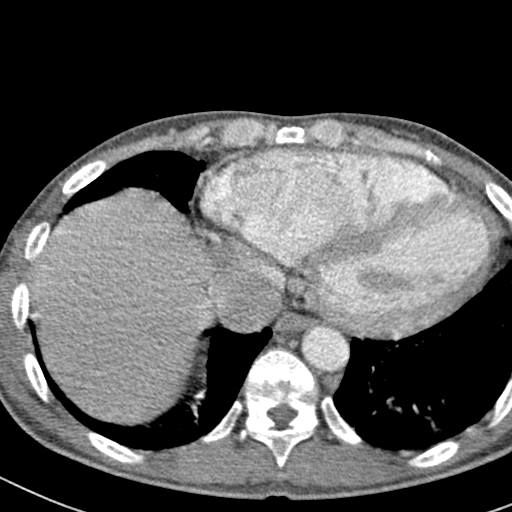
[im 47/266  lung]
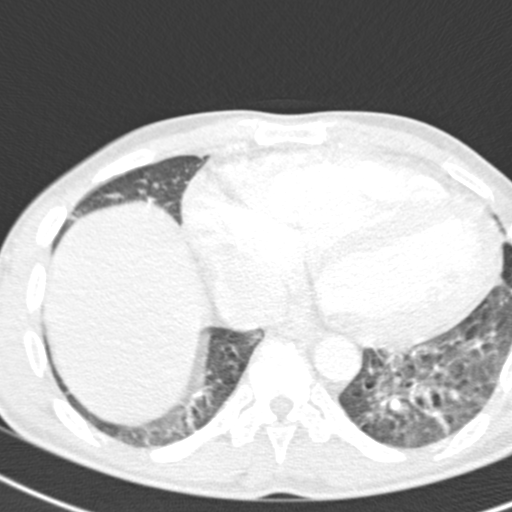
[im 58/266  soft-tissue]
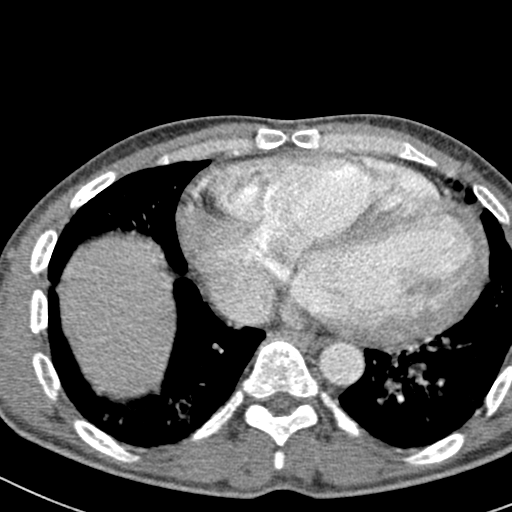
[im 70/266  lung]
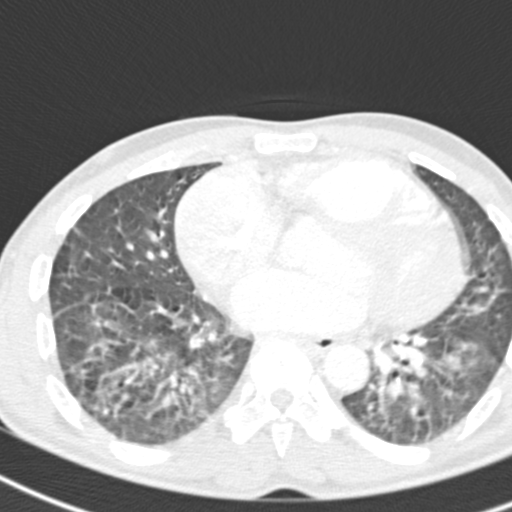
[im 93/266  soft-tissue]
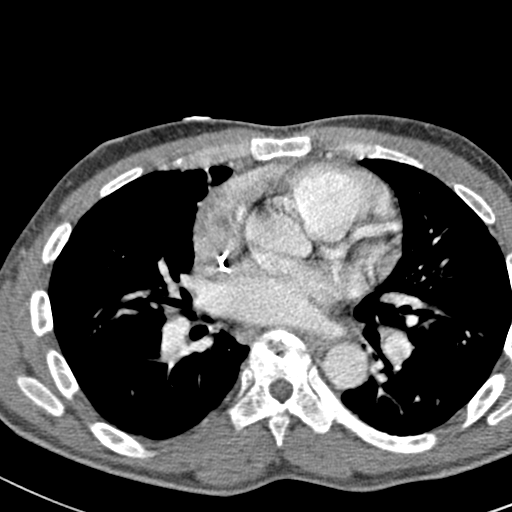
[im 104/266  lung]
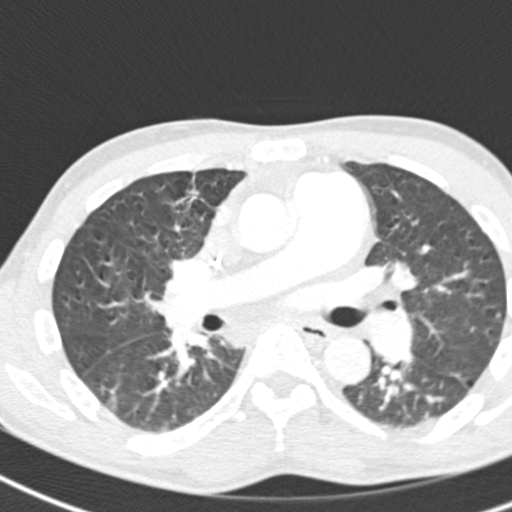
[im 116/266  soft-tissue]
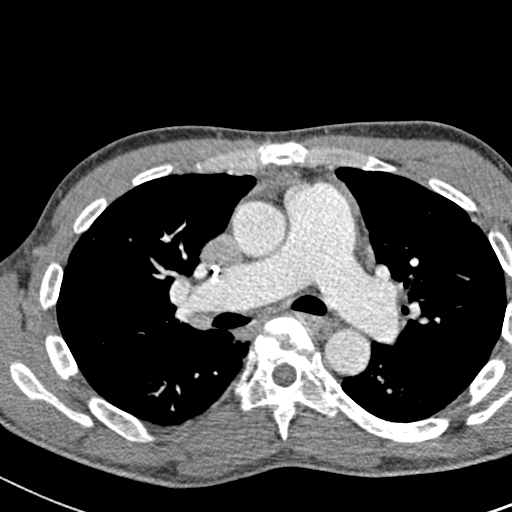
[im 139/266  lung]
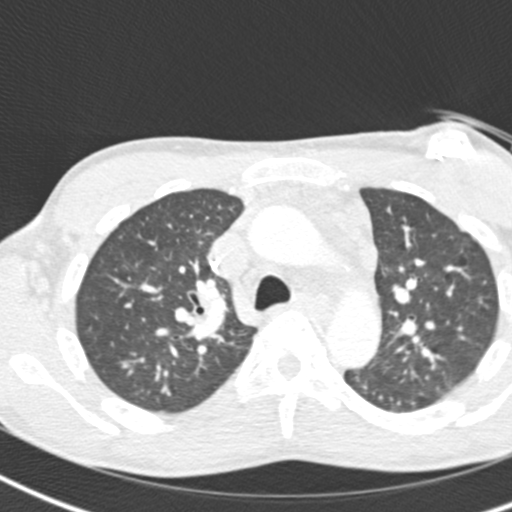
[im 150/266  soft-tissue]
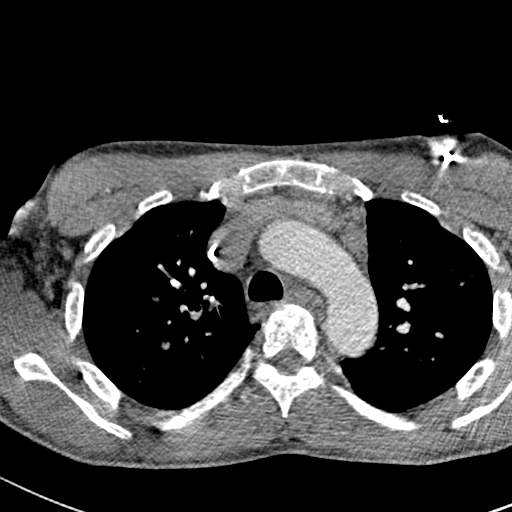
[im 162/266  lung]
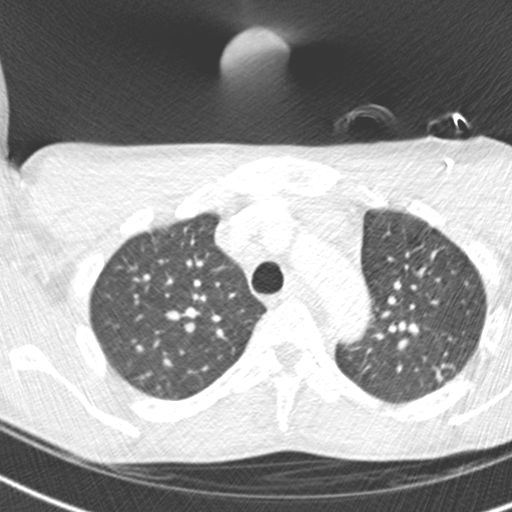
[im 173/266  soft-tissue]
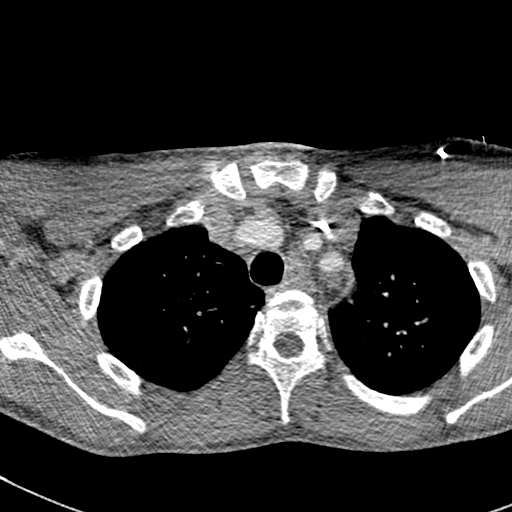
[im 196/266  lung]
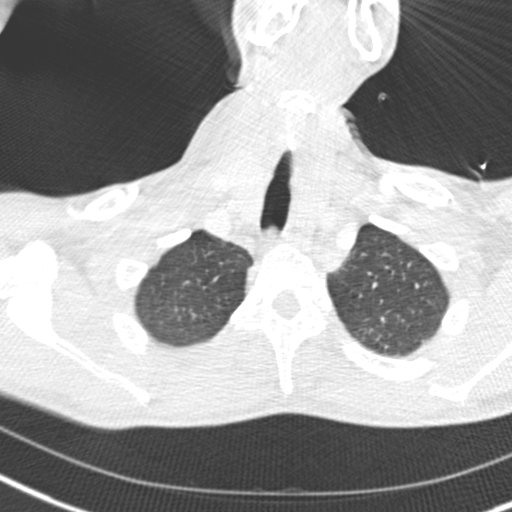
[im 208/266  soft-tissue]
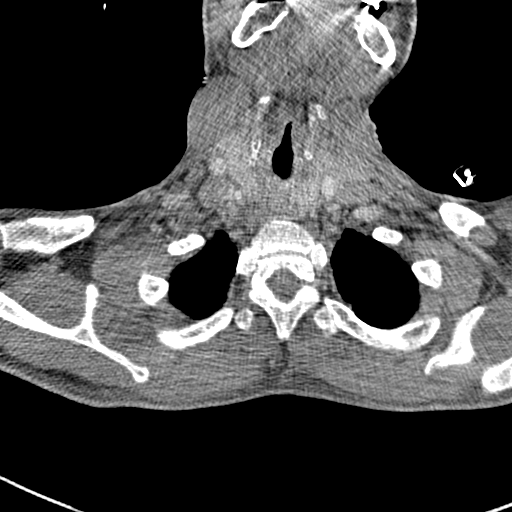
[im 219/266  lung]
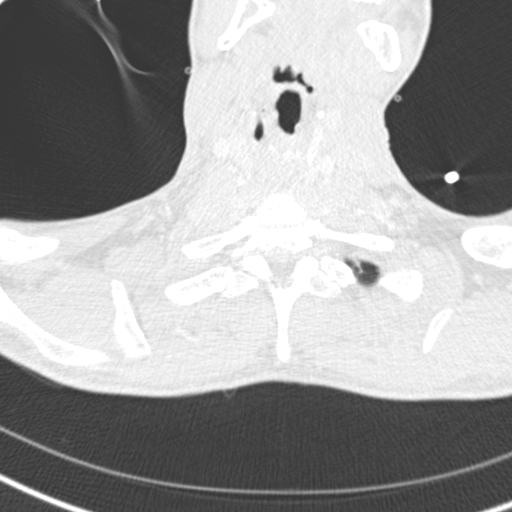
[im 242/266  soft-tissue]
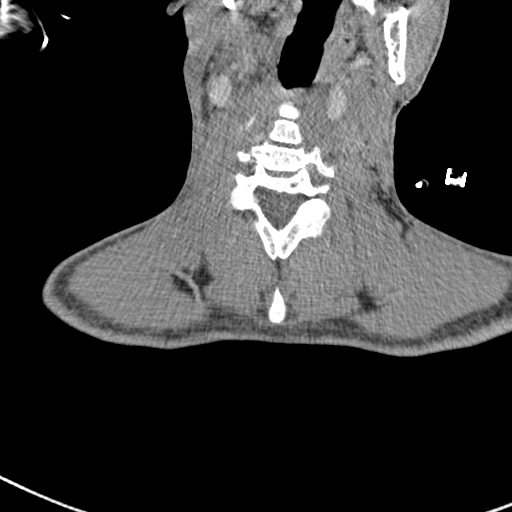
[im 254/266  lung]
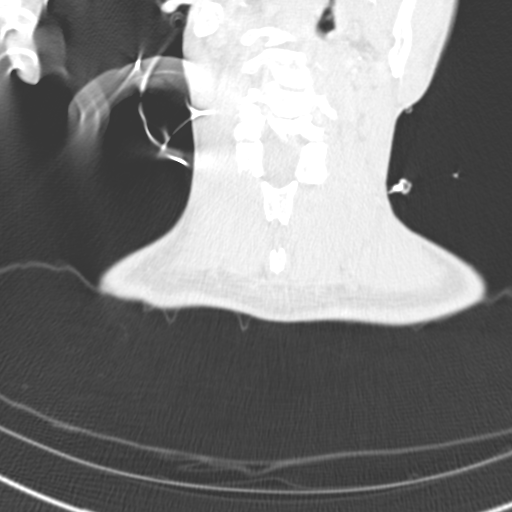

[17 of 32 positions shown; findings below may reference images not displayed]

CT
ANGIO CHEST W/CM &/OR WO/CM dated 03/26/2013; CT ANGIO CHEST W/CM
&/OR WO/CM dated 11/13/2012; CT ANGIO CHEST W/CM &/OR WO/CM
dated 08/27/2012
FINDINGS: Vascular Findings:

There is suboptimal adequate opacification of the pulmonary arterial
system with the main pulmonary artery measuring only 187 Hounsfield
units. Given this limitation, there are no discrete filling defects
within the pulmonary arterial tree to the level of the bilateral
subsegmental pulmonary arteries. Evaluation of distal subsegmental
pulmonary arteries is degraded secondary to suboptimal vessel
opacification and patient respiratory artifact. There is grossly
unchanged marked enlargement of the main pulmonary artery measuring
approximately 38 mm in diameter.

Cardiomegaly. No pericardial effusion. Normal caliber of the
thoracic aorta. Nild Pasmans configuration of the aortic arch. Branch
vessels of the aortic arch widely patent throughout their imaged
course. No definite thoracic aortic dissection or periaortic
stranding.

There is a left anterior chest wall subclavian vein approach port a
catheter with tip terminating within the superior aspect of the
right atrium.

Review of the MIP images confirms the above findings.

----------------------------------------------------------------------------------

Nonvascular Findings:

Evaluation of the pulmonary parenchyma is degraded secondary to
patient respiratory artifact.

Interval development / worsening of a ill-defined potentially
partially cavitary approximately 2.0 x 1.4 cm opacity within the
peripheral aspect of the superior segment of the right lower lobe
(image 67, series 9). There is an additional ill-defined area of
ground-glass within the subpleural aspect of the right lower lobe
(image 65, series 9).

There is grossly symmetric subpleural dependent subsegmental
atelectasis within the bilateral lower lobes and caudal segment of
the lingula. No pleural effusion or pneumothorax. The central
pulmonary airways appear widely patent. Shotty prevascular lymph
nodes are individually not enlarged by size criteria with index
prevascular lymph node measuring approximately 0.9 cm in greatest
short axis diameter (image 46, series 6). No mediastinal, hilar or
axillary lymphadenopathy.

Early arterial phase evaluation of the upper abdomen is normal.

No definite acute or aggressive osseous abnormalities. Accentuated
kyphosis within in the superior aspect of the thoracic spine, likely
positional. Stigmata of sickle cell disease affecting the imaged
osseous structures of the chest including mild diffuse increased
slightly mottled sclerosis and fish-mouth deformities about multiple
intervertebral disc spaces within in the mid cranial and mid aspects
of the thoracic spine.

Thyroid gland appears normal given obliquity. Regional soft tissues
appear normal.
IMPRESSION: 1. No evidence of pulmonary embolism to the level of the bilateral
subsegmental pulmonary arteries.
2. Interval development of airspace opacities within the right lower
and to a lesser extent, the right middle lobes, worrisome for
developing areas of infection and/or acute chest syndrome in this
patient with known sickle cell disease.
3. Cardiomegaly with enlargement of the caliber of the main
pulmonary artery, nonspecific though could be seen in the setting of
pulmonary arterial hypertension. Further evaluation could be
performed with cardiac echo as clinically indicated.

## 2016-04-25 MED ORDER — HYDROMORPHONE HCL 1 MG/ML IJ SOLN
1.0000 mg | INTRAMUSCULAR | Status: DC | PRN
  Administered 2016-04-25 – 2016-04-26 (×7): 1 mg via INTRAVENOUS
  Filled 2016-04-25 (×7): qty 1

## 2016-04-25 MED ORDER — TORSEMIDE 20 MG PO TABS: 20.0000 mg | ORAL_TABLET | Freq: Every day | ORAL | 1 refills | Status: DC

## 2016-04-25 MED ORDER — HYDROXYZINE HCL 25 MG PO TABS
25.0000 mg | ORAL_TABLET | Freq: Three times a day (TID) | ORAL | Status: DC | PRN
  Administered 2016-04-25: 25 mg via ORAL
  Filled 2016-04-25: qty 1

## 2016-04-25 MED ORDER — L-GLUTAMINE ORAL POWDER: 15.0000 g | PACK | Freq: Two times a day (BID) | ORAL | 1 refills | Status: AC

## 2016-04-25 MED ORDER — FOLIC ACID 1 MG PO TABS: 2.0000 mg | ORAL_TABLET | Freq: Every day | ORAL | Status: AC

## 2016-04-25 MED ORDER — HYDROMORPHONE HCL 4 MG PO TABS: 8.0000 mg | ORAL_TABLET | ORAL | 0 refills | Status: DC | PRN

## 2016-04-25 MED ORDER — POTASSIUM CHLORIDE CRYS ER 20 MEQ PO TBCR
40.0000 meq | EXTENDED_RELEASE_TABLET | Freq: Every day | ORAL | Status: DC
  Administered 2016-04-25 – 2016-04-26 (×2): 40 meq via ORAL
  Filled 2016-04-25 (×2): qty 2

## 2016-04-25 MED ORDER — HYDROXYUREA 500 MG PO CAPS: ORAL_CAPSULE | ORAL | Status: DC

## 2016-04-25 MED ORDER — HYDROMORPHONE HCL 4 MG PO TABS
8.0000 mg | ORAL_TABLET | ORAL | Status: DC
  Administered 2016-04-25 – 2016-04-26 (×6): 8 mg via ORAL
  Filled 2016-04-25 (×3): qty 2
  Filled 2016-04-25: qty 4
  Filled 2016-04-25 (×2): qty 2

## 2016-04-25 NOTE — Progress Notes (Signed)
SICKLE CELL SERVICE PROGRESS NOTE  [name] BUL:[id number] DOB: [birth year] DOA: 04/09/2016 PCP: [name], MD  Assessment/Plan: Principal Problem:   Sickle cell pain crisis ([place]) Active Problems:   Pulmonary HTN   Cor pulmonale, chronic (HCC)   Chronic anticoagulation   Anemia of chronic disease   Acute on chronic respiratory failure with hypoxia (HCC)   Hx of pulmonary embolus   Essential hypertension   Troponin level elevated   Prolonged Q-T interval on ECG   Acute pulmonary edema ([place])  1. Reticulocytopenia: Improved. Likely multifactorial including Hydrea. Recommend Hydrea be resumed at 75% of previous dose. Will check Hb and Reticulocyte tomorrow.  2. Acute Pulmonary Edema with Hypoxia: Oxygenation improved. ABG from Friday shows pA Oxygen in 71 which is acceptable.  3. Acute on Chronic Respiratory Failure with Hypoxia: Pt has been weaned to 3 L/min of Oxygen. Will plan to discharge home in 3 L/min of Oxygen 4. Anemia of Chronic Disease: Hb improved to 7.3 g/dL without need for further transfusion. Continue to hold Hydrea until Hb re-assessed and resume at 75% if indices allow.  5. Hb SS with crisis: Discontinue PCA today. Continue oral Dilaudid and MS Contin. Also continue Endari. Will likely need to be discharged home in Dilaudid 8 mg and weaned back down to 4 mg over the next week.  6. Leukocytosis: At baseline. Likely related to crisis. No clinical evidence of infection.  7. Chronic pain: Continue MS Contin 8. Chronic Anticoagulation: Pt on Xarelto for recurrent PE's. Continue Xarelto. Pt reports that he has missed 1 dose of Xarelto. However I do not feel that this one dose has conferred an increased risk of PE. Nevertheless the treatment would be to continue Xarelto.  9. Benign HTN: BP will controlled with current medications except with pain when they become elevated with pain.  Code Status: Full Code Family Communication: N/A Disposition Plan: Anticipate  discharge home tomorrow.   [name],[name]  Pager [phone]. If 7PM-7AM, please contact night-coverage.  04/25/2016, 2:04 PM  LOS: 15 days   Interim History: Pt awake and alert. He reports that he has no more Abrisentan at home. He has no new complaints at this time. He ambulated without difficulty and required 6 l/min during ambulation. ABG on 3 L/min shows pA Oxygen of 71.  He has done minimal walking over the weekend. Last BM last night. He has used 36.5 mg with 70/69:demands/deliveries in the last 24 hours.    Review of Records:  2-D ECHO ([facility]-01/06/16): Summary Technically adequate study Dilated right atrium and right ventricle with abnormal right ventricular systolic function consistent with acute strain on the right ventricle such as pulmonary embolus Other chambers appear structurally normal Normal LV systolic function , flattened intraventricular septum consistent with pressure overload on the right side, normal LV systolic ejection fraction calculated at 68% Valves appear structurally normal mild mitral regurgitation Moderate to severe tricuspid regurgitation with moderate pulmonary hypertension right ventricular systolic pressure estimated of 60 mmHg No pericardial effusion The findings are consistent with acute/ recent pulmonary embolus Signature   Consultants:  None  Procedures:  None  Antibiotics:  None  Objective: Vitals:   04/25/16 0227 04/25/16 0425 04/25/16 0533 04/25/16 1029  BP: 117/70  103/65 99/69  Pulse: 95  84 (!) 105  Resp: (!) 23 14 20 18   Temp: 99.6 F (37.6 C)  98.9 F (37.2 C) 97.7 F (36.5 C)  TempSrc: Oral  Oral Axillary  SpO2: 91% (!) 87% 91% 94%  Weight:   86.1 kg (189 lb 13.1 oz)   Height:       Weight change:   Intake/Output Summary (Last 24 hours) at 04/25/16 1404 Last data filed at 04/25/16 1020  Gross per 24 hour  Intake              370 ml  Output             1675 ml  Net            -1305 ml     General: Alert, awake, oriented x3, in no apparent distress. HEENT: /AT PEERL, EOMI, anicteric.  Heart: Regular rate and rhythm, II/VI systolic ejection murmurs at base, no rubs or gallops, PMI non-displaced, no heaves or thrills on palpation.  Lungs: Clear to auscultation except at b/l bases where there are mild crackles. No wheezing or rhonchi noted. No increased vocal fremitus resonant to percussion  Abdomen: Soft, nontender, nondistended, positive bowel sounds, no masses no hepatosplenomegaly noted.  Neuro: No focal neurological deficits noted cranial nerves II through XII grossly intact. . Strength at functional baseline in bilateral upper and lower extremities. Musculoskeletal: No warmth swelling or erythema around joints, no spinal tenderness noted. Psychiatric: Patient alert and oriented x3, good insight and cognition, good recent to remote recall.    Data Reviewed: Basic Metabolic Panel:  Recent Labs Lab 04/19/16 0635 04/24/16 0355  NA 140 141  K 3.7 3.7  CL 104 105  CO2 30 30  GLUCOSE 125* 126*  BUN 25* 24*  CREATININE 0.90 1.04  CALCIUM 9.2 9.1   Liver Function Tests:  Recent Labs Lab 04/19/16 0635 04/24/16 0355  AST 60* 67*  ALT 35 51  ALKPHOS 103 119  BILITOT 3.3* 3.5*  PROT 7.6 7.6  ALBUMIN 3.9 4.1   No results for input(s): LIPASE, AMYLASE in the last 168 hours. No results for input(s): AMMONIA in the last 168 hours. CBC:  Recent Labs Lab 04/19/16 0635 04/21/16 1143 04/22/16 0425 04/23/16 0530 04/24/16 0355  WBC 12.4* 13.1* 11.5* 16.1* 14.9*  NEUTROABS 7.0 8.0* 6.5  --  8.7*  HGB 7.9* 7.7* 6.4* 7.3* 7.3*  HCT 22.7* 22.5* 18.8* 22.2* 22.1*  MCV 89.7 90.4 90.0 89.9 89.1  PLT 512* 536* 425* 500* 488*   Cardiac Enzymes: No results for input(s): CKTOTAL, CKMB, CKMBINDEX, TROPONINI in the last 168 hours. BNP (last 3 results)  Recent Labs  04/13/16 1015 04/14/16 0500 04/21/16 1139  BNP 364.2* 315.7* 321.8*    ProBNP (last 3  results) No results for input(s): PROBNP in the last 8760 hours.  CBG: No results for input(s): GLUCAP in the last 168 hours.  No results found for this or any previous visit (from the past 240 hour(s)).   Studies: Dg Chest 2 View  Result Date: 04/22/2016 CLINICAL DATA:  Sickle cell pain crisis, shortness of breath today. History of hypertension. EXAM: CHEST  2 VIEW COMPARISON:  Chest x-rays dated 04/17/2016 and 02/11/2016. FINDINGS: Cardiomegaly is stable. There is central pulmonary vascular congestion. Prominent interstitial markings again noted bilaterally suggesting associated interstitial edema. No evidence of overt alveolar pulmonary edema. No confluent opacity to suggest a developing pneumonia. No pleural effusion or pneumothorax seen. Left chest wall Port-A-Cath is stable in position with tip at the level of the lower SVC. No acute or suspicious osseous finding. IMPRESSION: Cardiomegaly with central pulmonary vascular congestion and bilateral interstitial edema suggesting volume overload/CHF. No evidence of pneumonia. Electronically Signed   By: Roxy Horseman.D.  On: 04/22/2016 14:14   Dg Chest 2 View  Result Date: 04/13/2016 CLINICAL DATA:  History of sickle cell disease. Patient admitted 04/10/2016 with chest pain. Hypoxia and pulmonary edema. EXAM: CHEST  2 VIEW COMPARISON:  PA and lateral chest 04/09/2016 and 01/31/2016. Single-view of the chest 04/11/2016. FINDINGS: Marked cardiomegaly is again seen. There is mild interstitial edema which appears unchanged since yesterday's examination. No pneumothorax or pleural effusion. No consolidative process. Port-A-Cath is in place. Avascular necrosis left humeral head is noted. IMPRESSION: No change in mild interstitial edema in patient with marked cardiomegaly. Electronically Signed   By: Inge Rise M.D.   On: 04/13/2016 10:54   Dg Chest 2 View  Result Date: 04/10/2016 CLINICAL DATA:  Bilateral chest wall pain. Sickle cell disease.  Pain began yesterday. EXAM: CHEST  2 VIEW COMPARISON:  03/27/2016 FINDINGS: Left subclavian port extends to the low SVC. Unchanged cardiomegaly. No airspace consolidation. No pleural effusions. Pulmonary vasculature is normal. Hilar and mediastinal contours are unremarkable and unchanged. IMPRESSION: Unchanged cardiomegaly.  No consolidation or effusion. Electronically Signed   By: Andreas Newport M.D.   On: 04/10/2016 00:50   Dg Chest 2 View  Result Date: 03/27/2016 CLINICAL DATA:  Left chest pain.  Sickle cell anemia EXAM: CHEST  2 VIEW COMPARISON:  03/22/2016 FINDINGS: Cardiac enlargement with vascular congestion. Negative for edema or effusion. Negative for pneumonia. Port-A-Cath tip in the SVC. IMPRESSION: Cardiac enlargement with pulmonary vascular congestion. Negative for pneumonia or edema. Electronically Signed   By: Franchot Gallo M.D.   On: 03/27/2016 16:02   Dg Chest Port 1 View  Result Date: 04/17/2016 CLINICAL DATA:  Lethargy.  Shortness of breath. EXAM: PORTABLE CHEST 1 VIEW COMPARISON:  None. FINDINGS: Stable enlarged cardiac silhouette. Increased density at the left lateral lung base. Interval mild patchy and linear opacity at the right lung base. Left subclavian porta catheter tip in the right atrium. No acute bony abnormality. 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 IMPRESSION: 1. Interval atelectasis or pneumonia at the left lateral lung base. 2. Interval mild atelectasis and possible pneumonia at the right lung base. 3. Stable cardiomegaly. 4. Left subclavian porta catheter tip in the right atrium. This could be retracted 4.5 cm to place it in the superior vena cava. Electronically Signed   By: Claudie Revering M.D.   On: 04/17/2016 09:06   Dg Chest Port 1 View  Result Date: 04/11/2016 CLINICAL DATA:  Initial evaluation for sickle cell pain crisis. Worsened shortness of breath. EXAM: PORTABLE CHEST 1 VIEW COMPARISON:  Prior radiograph from 04/09/2016. FINDINGS:  Left-sided Port-A-Cath in place, stable. Severe cardiomegaly not significantly changed. Mediastinal silhouette within normal limits. Lungs mildly hypoinflated. There is slightly worsened pulmonary vascular congestion as compared to previous. Slightly worsened right infrahilar opacity, which may reflect developing infiltrate and/or atelectasis. More linear opacities at the bilateral lung bases most consistent with atelectasis. No pleural effusion. No pneumothorax. No acute osseous abnormality. IMPRESSION: 1. Severe cardiomegaly with slightly worsened diffuse pulmonary vascular congestion as compared to 04/09/16, which may reflect developing pulmonary edema. 2. Slightly patchy right infrahilar opacity, which may reflect congestion and/or developing infiltrate. 3. Mildly increased superimposed bibasilar atelectasis. Electronically Signed   By: Jeannine Boga M.D.   On: 04/11/2016 06:10    Scheduled Meds: . ambrisentan  5 mg Oral Daily  . aspirin  81 mg Oral Daily  . carvedilol  3.125 mg Oral BID WC  . Chlorhexidine Gluconate Cloth  6 each Topical Q0600  .  cholecalciferol  2,000 Units Oral Daily  . folic acid  2 mg Oral Daily  . gabapentin  300 mg Oral TID  . HYDROmorphone   Intravenous Q4H  . HYDROmorphone  6 mg Oral Q4H  . L-glutamine  15 g Oral BID  . mouth rinse  15 mL Mouth Rinse BID  . morphine  30 mg Oral Q12H  . rivaroxaban  20 mg Oral Q supper  . senna-docusate  1 tablet Oral BID  . torsemide  20 mg Oral Daily   Continuous Infusions:   Principal Problem:   Sickle cell pain crisis (HCC) Active Problems:   Pulmonary HTN   Cor pulmonale, chronic (HCC)   Chronic anticoagulation   Anemia of chronic disease   Acute on chronic respiratory failure with hypoxia (HCC)   Hx of pulmonary embolus   Essential hypertension   Troponin level elevated   Prolonged Q-T interval on ECG   Acute pulmonary edema (HCC)   In excess of 25 minutes spent during this visit. Greater than 50% involved  face to face contact with the patient for assessment, counseling and coordination of care.

## 2016-04-25 NOTE — Care Management Important Message (Signed)
Important Message  Patient Details  Name: [name] MRN: [id number] Date of Birth: [birth year]   Medicare Important Message Given:  Yes    Kerin Salen 04/25/2016, 11:49 AMImportant Message  Patient Details  Name: [name] MRN: [id number] Date of Birth: 30-Sep-1980   Medicare Important Message Given:  Yes    Kerin Salen 04/25/2016, 11:49 AM

## 2016-04-25 NOTE — Progress Notes (Signed)
Pt ambulated with Oxygen @ 3 liters sats --81-85% ambulating, when returned to bed on 3l sats increased to 88-92. SRP, RN

## 2016-04-25 NOTE — Progress Notes (Signed)
Pt is on home O2 with [facility].  There are no other needs at present time.

## 2016-04-26 ENCOUNTER — Other Ambulatory Visit: Payer: Self-pay | Admitting: Internal Medicine

## 2016-04-26 DIAGNOSIS — J9612 Chronic respiratory failure with hypercapnia: Principal | ICD-10-CM

## 2016-04-26 DIAGNOSIS — J9611 Chronic respiratory failure with hypoxia: Secondary | ICD-10-CM

## 2016-04-26 LAB — CBC WITH DIFFERENTIAL/PLATELET
BASOS PCT: 1 %
Basophils Absolute: 0.2 10*3/uL — ABNORMAL HIGH (ref 0.0–0.1)
EOS ABS: 1 10*3/uL — AB (ref 0.0–0.7)
EOS PCT: 6 %
HCT: 22.6 % — ABNORMAL LOW (ref 39.0–52.0)
Hemoglobin: 7.5 g/dL — ABNORMAL LOW (ref 13.0–17.0)
LYMPHS ABS: 2.3 10*3/uL (ref 0.7–4.0)
Lymphocytes Relative: 14 %
MCH: 29.8 pg (ref 26.0–34.0)
MCHC: 33.2 g/dL (ref 30.0–36.0)
MCV: 89.7 fL (ref 78.0–100.0)
Monocytes Absolute: 2.2 10*3/uL — ABNORMAL HIGH (ref 0.1–1.0)
Monocytes Relative: 13 %
NEUTROS ABS: 11 10*3/uL — AB (ref 1.7–7.7)
NEUTROS PCT: 66 %
Platelets: 480 10*3/uL — ABNORMAL HIGH (ref 150–400)
RBC: 2.52 MIL/uL — ABNORMAL LOW (ref 4.22–5.81)
RDW: 22.4 % — AB (ref 11.5–15.5)
WBC: 16.7 10*3/uL — ABNORMAL HIGH (ref 4.0–10.5)

## 2016-04-26 LAB — RETICULOCYTES
RBC.: 2.52 MIL/uL — ABNORMAL LOW (ref 4.22–5.81)
RETIC COUNT ABSOLUTE: 315 10*3/uL — AB (ref 19.0–186.0)
Retic Ct Pct: 12.5 % — ABNORMAL HIGH (ref 0.4–3.1)

## 2016-04-26 LAB — BASIC METABOLIC PANEL
ANION GAP: 5 (ref 5–15)
BUN: 21 mg/dL — ABNORMAL HIGH (ref 6–20)
CHLORIDE: 106 mmol/L (ref 101–111)
CO2: 28 mmol/L (ref 22–32)
Calcium: 9 mg/dL (ref 8.9–10.3)
Creatinine, Ser: 1.02 mg/dL (ref 0.61–1.24)
GFR calc Af Amer: >60 mL/min (ref >60)
GLUCOSE: 105 mg/dL — AB (ref 65–99)
POTASSIUM: 3.8 mmol/L (ref 3.5–5.1)
Sodium: 139 mmol/L (ref 135–145)

## 2016-04-26 MED ORDER — POTASSIUM CHLORIDE CRYS ER 20 MEQ PO TBCR: 40.0000 meq | EXTENDED_RELEASE_TABLET | Freq: Every day | ORAL | 2 refills | Status: AC

## 2016-04-26 MED ORDER — HYDROXYUREA 500 MG PO CAPS
500.0000 mg | ORAL_CAPSULE | ORAL | Status: DC
  Administered 2016-04-26: 500 mg via ORAL
  Filled 2016-04-26: qty 1

## 2016-04-26 MED ORDER — HEPARIN SOD (PORK) LOCK FLUSH 100 UNIT/ML IV SOLN
500.0000 [IU] | INTRAVENOUS | Status: AC | PRN
  Administered 2016-04-26: 500 [IU]

## 2016-04-26 MED ORDER — HYDROXYUREA 500 MG PO CAPS: 1000.0000 mg | ORAL_CAPSULE | ORAL | Status: DC

## 2016-04-26 NOTE — Care Management Note (Signed)
Case Management Note  Patient Details  Name: [name] MRN: [id number] Date of Birth: 04-Sep-1980  Subjective/Objective:CM f/u:home 02 portable tanks-[facility].TC [facility]-[employer]ional Sales Manager-[facility]y [facility]etser-provided timeline of portable home 02 tank issue:03/23/16-CM from otpt setting called [facility] dme about script sent to office for home 02 portable tanks.03/25/16-[facility] respiratory therapist delivered home filled portable 02 tanks different sizes-02 2l [place] continuous; patient offered the type that [facility] had available but patient declined since he wanted the type from the TV show([facility] did not carry the TV show type). 04/20/16- patient's mother called [facility] office for [facility] to pick up portable tanks, but agency told the mother they had to get the ok from the patient(patient never called) Therefore there is no documentation that portable tanks were ever picked up from patient's home. Today-home 02 ordered 4lnc continuous-[facility] dme rep Jermaine will deliver home 02-portable tank to patient's rm prior d/c, also [facility] Sweetser will have respiratory therapist to make home visit to instruct patient/family on use of portable tank-Betsy Sweetser-voiced understanding of recommendation. No further CM needs.                   Action/Plan:d/c home w/home 02.   Expected Discharge Date:                  Expected Discharge Plan:  Home/Self Care  In-House Referral:  NA  Discharge planning Services  CM Consult  Post Acute Care Choice:  NA Choice offered to:  NA  DME Arranged:  Oxygen DME Agency:  [place]:    [place] Agency:     Status of Service:  Completed, signed off  If discussed at [place] of Stay Meetings, dates discussed:    Additional Comments:  Dessa Phi, RN 04/26/2016, 12:36 PM

## 2016-04-26 NOTE — Discharge Summary (Signed)
[name] MRN: [id number] DOB/AGE: 09-Oct-1980 36 y.o.  Admit date: 04/09/2016 Discharge date: 04/26/2016  Primary Care Physician:  [name], MD   Discharge Diagnoses:   Patient Active Problem List   Diagnosis Date Noted  . Cor pulmonale, chronic ([place]) 08/25/2014    Priority: High  . Pulmonary HTN 06/18/2013    Priority: High  . Functional asplenia     Priority: High  . Acute on chronic respiratory failure with hypoxia (HCC)     Priority: Medium  . Anemia of chronic disease 06/25/2014    Priority: Medium  . Chronic respiratory failure with hypoxia ([place]) 03/14/2014    Priority: Medium  . Chronic anticoagulation 08/22/2013    Priority: Medium  . Avascular necrosis ([place])     Priority: Low  . Acute pulmonary edema ([place]) 04/11/2016  . H/O arterial ischemic stroke 11/11/2015  . Slurred speech 11/10/2015  . Chronic anemia   . Hb-SS disease without crisis ([place]) 10/07/2014  . Prolonged Q-T interval on ECG 09/10/2014  . Troponin level elevated 08/29/2014  . Sickle cell anemia ([place]) 06/25/2014  . PAH (pulmonary artery hypertension) 03/18/2014  . Paralytic strabismus, external ophthalmoplegia   . Chronic pain syndrome 12/12/2013  . Essential hypertension 08/22/2013  . Vitamin D deficiency 02/13/2013  . Rib pain 01/13/2013  . Hx of pulmonary embolus 06/29/2012  . 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 Sickle cell pain crisis ([place])     DISCHARGE MEDICATION: Allergies as of 04/26/2016   No Known Allergies     Medication List    TAKE these medications   ambrisentan 5 MG tablet Commonly known as:  LETAIRIS Take 5 mg by mouth daily.   aspirin 81 MG chewable tablet Chew 1 tablet (81 mg total) by mouth daily.   carvedilol 3.125 MG tablet Commonly known as:  COREG Take 1 tablet (3.125 mg total) by mouth 2 (two) times daily with a meal.   cholecalciferol 1000 units tablet Commonly known as:  VITAMIN D Take 2,000 Units by mouth daily.   folic acid 1 MG  tablet Commonly known as:  FOLVITE Take 2 tablets (2 mg total) by mouth daily. What changed:  how much to take   gabapentin 300 MG capsule Commonly known as:  NEURONTIN Take 1 capsule (300 mg total) by mouth 3 (three) times daily.   HYDROmorphone 4 MG tablet Commonly known as:  DILAUDID Take 2 tablets (8 mg total) by mouth every 4 (four) hours as needed for moderate pain or severe pain. What changed:  how much to take   hydroxyurea 500 MG capsule Commonly known as:  HYDREA Take 1000 mg QOD and alternate with 500 mg QOD. May take with food to minimize GI side effects. What changed:  how much to take  how to take this  when to take this  additional instructions   JADENU 360 MG Tabs Generic drug:  Deferasirox Take 1,080 mg by mouth daily.   L-glutamine 5 g Pack Powder Packet Commonly known as:  ENDARI Take 15 g by mouth 2 (two) times daily.   morphine 30 MG 12 hr tablet Commonly known as:  MS CONTIN Take 1 tablet (30 mg total) by mouth every 12 (twelve) hours.   potassium chloride SA 20 MEQ tablet Commonly known as:  K-DUR,KLOR-CON Take 2 tablets (40 mEq total) by mouth daily. Start taking on:  04/27/2016   rivaroxaban 20 MG Tabs tablet Commonly known as:  XARELTO Take 1 tablet (20 mg total) by mouth at bedtime.  torsemide 20 MG tablet Commonly known as:  DEMADEX Take 1 tablet (20 mg total) by mouth daily.   zolpidem 10 MG tablet Commonly known as:  AMBIEN Take 1 tablet (10 mg total) by mouth at bedtime as needed for sleep.            Durable Medical Equipment        Start     Ordered   04/26/16 0000  For home use only DME oxygen    Question Answer Comment  Liters per Minute 4   Frequency Continuous (stationary and portable oxygen unit needed)   Oxygen conserving device Yes   Oxygen delivery system Gas      04/26/16 1152        Consults:    SIGNIFICANT DIAGNOSTIC STUDIES:  Dg Chest 2 View  Result Date: 04/22/2016 CLINICAL DATA:  Sickle  cell pain crisis, shortness of breath today. History of hypertension. EXAM: CHEST  2 VIEW COMPARISON:  Chest x-rays dated 04/17/2016 and 02/11/2016. FINDINGS: Cardiomegaly is stable. There is central pulmonary vascular congestion. Prominent interstitial markings again noted bilaterally suggesting associated interstitial edema. No evidence of overt alveolar pulmonary edema. No confluent opacity to suggest a developing pneumonia. No pleural effusion or pneumothorax seen. Left chest wall Port-A-Cath is stable in position with tip at the level of the lower SVC. No acute or suspicious osseous finding. IMPRESSION: Cardiomegaly with central pulmonary vascular congestion and bilateral interstitial edema suggesting volume overload/CHF. No evidence of pneumonia. Electronically Signed   By: Franki Cabot M.D.   On: 04/22/2016 14:14   Dg Chest 2 View  Result Date: 04/13/2016 CLINICAL DATA:  History of sickle cell disease. Patient admitted 04/10/2016 with chest pain. Hypoxia and pulmonary edema. EXAM: CHEST  2 VIEW COMPARISON:  PA and lateral chest 04/09/2016 and 01/31/2016. Single-view of the chest 04/11/2016. FINDINGS: Marked cardiomegaly is again seen. There is mild interstitial edema which appears unchanged since yesterday's examination. No pneumothorax or pleural effusion. No consolidative process. Port-A-Cath is in place. Avascular necrosis left humeral head is noted. IMPRESSION: No change in mild interstitial edema in patient with marked cardiomegaly. Electronically Signed   By: Inge Rise M.D.   On: 04/13/2016 10:54   Dg Chest 2 View  Result Date: 04/10/2016 CLINICAL DATA:  Bilateral chest wall pain. Sickle cell disease. Pain began yesterday. EXAM: CHEST  2 VIEW COMPARISON:  03/27/2016 FINDINGS: Left subclavian port extends to the low SVC. Unchanged cardiomegaly. No airspace consolidation. No pleural effusions. Pulmonary vasculature is normal. Hilar and mediastinal contours are unremarkable and unchanged.  IMPRESSION: Unchanged cardiomegaly.  No consolidation or effusion. Electronically Signed   By: Andreas [place] M.D.   On: 04/10/2016 00:50   Dg Chest 2 View  Result Date: 03/27/2016 CLINICAL DATA:  Left chest pain.  Sickle cell anemia EXAM: CHEST  2 VIEW COMPARISON:  03/22/2016 FINDINGS: Cardiac enlargement with vascular congestion. Negative for edema or effusion. Negative for pneumonia. Port-A-Cath tip in the SVC. IMPRESSION: Cardiac enlargement with pulmonary vascular congestion. Negative for pneumonia or edema. Electronically Signed   By: Franchot Gallo M.D.   On: 03/27/2016 16:02   Dg Chest Port 1 View  Result Date: 04/17/2016 CLINICAL DATA:  Lethargy.  Shortness of breath. EXAM: PORTABLE CHEST 1 VIEW COMPARISON:  None. FINDINGS: Stable enlarged cardiac silhouette. Increased density at the left lateral lung base. Interval mild patchy and linear opacity at the right lung base. Left subclavian porta catheter tip in the right atrium. No acute bony abnormality. Previously noted avascular  necrosis of the left humeral head. IMPRESSION: 1. Interval atelectasis or pneumonia at the left lateral lung base. 2. Interval mild atelectasis and possible pneumonia at the right lung base. 3. Stable cardiomegaly. 4. Left subclavian porta catheter tip in the right atrium. This could be retracted 4.5 cm to place it in the superior vena cava. Electronically Signed   By: Claudie Revering M.D.   On: 04/17/2016 09:06   Dg Chest Port 1 View  Result Date: 04/11/2016 CLINICAL DATA:  Initial evaluation for sickle cell pain crisis. Worsened shortness of breath. EXAM: PORTABLE CHEST 1 VIEW COMPARISON:  Prior radiograph from 04/09/2016. FINDINGS: Left-sided Port-A-Cath in place, stable. Severe cardiomegaly not significantly changed. Mediastinal silhouette within normal limits. Lungs mildly hypoinflated. There is slightly worsened pulmonary vascular congestion as compared to previous. Slightly worsened right infrahilar opacity, which  may reflect developing infiltrate and/or atelectasis. More linear opacities at the bilateral lung bases most consistent with atelectasis. No pleural effusion. No pneumothorax. No acute osseous abnormality. IMPRESSION: 1. Severe cardiomegaly with slightly worsened diffuse pulmonary vascular congestion as compared to 04/09/16, which may reflect developing pulmonary edema. 2. Slightly patchy right infrahilar opacity, which may reflect congestion and/or developing infiltrate. 3. Mildly increased superimposed bibasilar atelectasis. Electronically Signed   By: Jeannine Boga M.D.   On: 04/11/2016 06:10       No results found for this or any previous visit (from the past 240 hour(s)).  BRIEF ADMITTING H & P: RITVIK MCZEAL is a 36 y.o. male with medical history significant of sickle cell disease, stroke with aphasia, acute chest syndrome, DVT, PE on Xarelto, pulmonary hypertension, cor pulmonale, chronic respiratory failure, C diff colitis, who presents with bilateral rib cages.  Patient states that his bilateral rib cage pain started yesterday, which has been progressively getting worse. His home pain medication does not help anymore. Patient does not have SOB, cough, fever or chills. He also has mild chest pain, which is located in the frontal chest, constant, dull, nonradiating. Patient denies nausea, vomiting, diarrhea, abdominal pain, symptoms of UTI. No leg edema. Patient states that he has been compliant to taking his Xarelto.  ED Course: pt was found to have WBC 23.5, stable hemoglobin 8.0, electrolytes renal function okay, negative chest x-ray, temperature normal, oxygen saturation 80-87% on room air, tachycardia, tachypnea. Patient is admitted to telemetry bed as inpatient   Hospital Course:  Present on Admission: . Sickle cell pain crisis ([place]) . Hx of pulmonary embolus . Pulmonary HTN . Essential hypertension . Cor pulmonale, chronic ([place]) . Acute pulmonary edema (HCC) . Acute  on chronic respiratory failure with hypoxia ([place]) . Anemia of chronic disease . Prolonged Q-T interval on ECG . Troponin level elevated  Pt with Hb SS, Cor Pulmonale, Chronic Hypoxic Respiratory Failure admitted with crisis and acute pulmonary edema leading to acute component of respiratory failure. His pain was managed with Dilaudid via PCA, and Toradol. He was started on diuretics and after several days of diuretics, his Oxygenation improved and his Oxygen requirements returned to baseline.  At the time of discharge he was on 3-4 l/min of Oxygenation with ambulation and his pain was at 7/10 but patient was ambulatory without difficulty.  Note that this patient has high medical risk due to his co-morbidities including PAH with chronic respiratory failure, Anemia of chronic disease, S/P CVA, Hb SS, Chronic pain and chronic opiate use and non-adherence to medical therapy. Pt has been offered every resource that we have available here to assist with  his adherence to therapy and he has refused assistance in the ambulatory setting.    Disposition and Follow-up: Discharged home in stable condition. He is to follow up with PMD in 1 week.   Discharge Instructions    Activity as tolerated - No restrictions    Complete by:  As directed    Use oxygen with ambulation.   Diet - low sodium heart healthy    Complete by:  As directed    For home use only DME oxygen    Complete by:  As directed    Liters per Minute:  4   Frequency:  Continuous (stationary and portable oxygen unit needed)   Oxygen conserving device:  Yes   Oxygen delivery system:  Gas      DISCHARGE EXAM:  General: Alert, awake, oriented x3, in mild distress.  HEENT: [place]/AT PEERL, EOMI, anicteric Neck: Trachea midline, no masses, no thyromegal,y no JVD, no carotid bruit OROPHARYNX: Moist, No exudate/ erythema/lesions.  Heart: Regular rate and rhythm, without rubs, gallops or S3. However he does have a II/VI SEM and a loud P2. PMI  non-displaced. Exam reveals no decreased pulses. Pulmonary/Chest: Normal effort. Breath sounds normal. No. Apnea. Clear to auscultation,no stridor,  no wheezing and no rhonchi noted. No respiratory distress and no tenderness noted. Abdomen: Soft, nontender, nondistended, normal bowel sounds, no masses no hepatosplenomegaly noted. No fluid wave and no ascites. There is no guarding or rebound. Neuro: Alert and oriented to person, place and time. Normal motor skills, Displays no atrophy or tremors and exhibits normal muscle tone.  No focal neurological deficits noted cranial nerves II through XII grossly intact. No sensory deficit noted. Strength at baseline in bilateral upper and lower extremities. Gait normal. Musculoskeletal: No warmth swelling or erythema around joints, no spinal tenderness noted. Psychiatric: Patient alert and oriented x3, good insight and cognition, good recent to remote recall. Lymph node survey: No cervical axillary or inguinal lymphadenopathy noted. Skin: Skin is warm and dry. No bruising, no ecchymosis and no rash noted. Pt is not diaphoretic. No erythema. No pallor    Blood pressure 104/74, pulse 75, temperature 97.9 F (36.6 C), temperature source Oral, resp. rate 18, height 6' (1.829 m), weight 83.4 kg (183 lb 13.8 oz), SpO2 94 %.   Recent Labs  04/24/16 0355 04/26/16 0437  NA 141 139  K 3.7 3.8  CL 105 106  CO2 30 28  GLUCOSE 126* 105*  BUN 24* 21*  CREATININE 1.04 1.02  CALCIUM 9.1 9.0    Recent Labs  04/24/16 0355  AST 67*  ALT 51  ALKPHOS 119  BILITOT 3.5*  PROT 7.6  ALBUMIN 4.1   No results for input(s): LIPASE, AMYLASE in the last 72 hours.  Recent Labs  04/24/16 0355 04/26/16 0437  WBC 14.9* 16.7*  NEUTROABS 8.7* 11.0*  HGB 7.3* 7.5*  HCT 22.1* 22.6*  MCV 89.1 89.7  PLT 488* 480*     Total time spent including face to face and decision making was greater than 30 minutes  Signed: Joann Kulpa A. 04/26/2016, 3:28 PM

## 2016-04-26 NOTE — Care Management Note (Signed)
Case Management Note  Patient Details  Name: JAQUALYN JUDAY MRN: [id number] Date of Birth: 1980/12/11  Subjective/Objective: Went into patient's rm to discuss d/c plan & ongoing f/u @ home. Patient agreed to [facility] allowing [facility] rep on speaker phone to confirm home visit tomorrow by respiratory therapist: [facility] will call patient @ home to set up home visit for teaching of smaller lighter portable 02 tanks-patient will have his mother there for teaching also-patient voiced understanding. There are 2 home 02 tanks already in patient's rm for d/c today.  No further CM needs.                   Action/Plan:d/c home w/home 02.   Expected Discharge Date:                  Expected Discharge Plan:  Home/Self Care  In-House Referral:  NA  Discharge planning Services  CM Consult  Post Acute Care Choice:  NA Choice offered to:  NA  DME Arranged:  Oxygen DME Agency:  [place]:    [place] Agency:     Status of Service:  Completed, signed off  If discussed at [place] of Stay Meetings, dates discussed:    Additional Comments:  Dessa Phi, RN 04/26/2016, 2:04 PM

## 2016-04-29 ENCOUNTER — Inpatient Hospital Stay (HOSPITAL_COMMUNITY)
Admission: EM | Admit: 2016-04-29 | Discharge: 2016-04-30 | DRG: 811 | Disposition: A | Discharge status: Home / Self Care | Payer: Medicare Other | Attending: Emergency Medicine | Admitting: Emergency Medicine

## 2016-04-29 ENCOUNTER — Emergency Department (HOSPITAL_COMMUNITY): Payer: Medicare Other

## 2016-04-29 ENCOUNTER — Emergency Department (HOSPITAL_COMMUNITY)
Admission: EM | Admit: 2016-04-29 | Discharge: 2016-04-29 | Disposition: A | Discharge status: Home / Self Care | Payer: Medicare Other | Source: Home / Self Care | Attending: Emergency Medicine | Admitting: Emergency Medicine

## 2016-04-29 ENCOUNTER — Encounter (HOSPITAL_COMMUNITY): Payer: Self-pay | Admitting: Emergency Medicine

## 2016-04-29 DIAGNOSIS — Z9981 Dependence on supplemental oxygen: Secondary | ICD-10-CM

## 2016-04-29 DIAGNOSIS — J810 Acute pulmonary edema: Secondary | ICD-10-CM | POA: Diagnosis not present

## 2016-04-29 DIAGNOSIS — Z8673 Personal history of transient ischemic attack (TIA), and cerebral infarction without residual deficits: Secondary | ICD-10-CM

## 2016-04-29 DIAGNOSIS — I2781 Cor pulmonale (chronic): Secondary | ICD-10-CM | POA: Diagnosis present

## 2016-04-29 DIAGNOSIS — R9431 Abnormal electrocardiogram [ECG] [EKG]: Secondary | ICD-10-CM | POA: Diagnosis not present

## 2016-04-29 DIAGNOSIS — R072 Precordial pain: Secondary | ICD-10-CM

## 2016-04-29 DIAGNOSIS — K5900 Constipation, unspecified: Secondary | ICD-10-CM | POA: Diagnosis present

## 2016-04-29 DIAGNOSIS — I517 Cardiomegaly: Secondary | ICD-10-CM | POA: Diagnosis not present

## 2016-04-29 DIAGNOSIS — Z96641 Presence of right artificial hip joint: Secondary | ICD-10-CM | POA: Diagnosis present

## 2016-04-29 DIAGNOSIS — Z86718 Personal history of other venous thrombosis and embolism: Secondary | ICD-10-CM

## 2016-04-29 DIAGNOSIS — J9611 Chronic respiratory failure with hypoxia: Secondary | ICD-10-CM | POA: Diagnosis not present

## 2016-04-29 DIAGNOSIS — W19XXXA Unspecified fall, initial encounter: Secondary | ICD-10-CM | POA: Diagnosis not present

## 2016-04-29 DIAGNOSIS — I2721 Secondary pulmonary arterial hypertension: Secondary | ICD-10-CM | POA: Diagnosis not present

## 2016-04-29 DIAGNOSIS — Z7982 Long term (current) use of aspirin: Secondary | ICD-10-CM

## 2016-04-29 DIAGNOSIS — I10 Essential (primary) hypertension: Secondary | ICD-10-CM | POA: Diagnosis not present

## 2016-04-29 DIAGNOSIS — D5700 Hb-SS disease with crisis, unspecified: Secondary | ICD-10-CM | POA: Diagnosis not present

## 2016-04-29 DIAGNOSIS — G894 Chronic pain syndrome: Secondary | ICD-10-CM | POA: Diagnosis present

## 2016-04-29 DIAGNOSIS — Z86711 Personal history of pulmonary embolism: Secondary | ICD-10-CM | POA: Diagnosis not present

## 2016-04-29 DIAGNOSIS — D638 Anemia in other chronic diseases classified elsewhere: Secondary | ICD-10-CM | POA: Diagnosis not present

## 2016-04-29 DIAGNOSIS — Z79891 Long term (current) use of opiate analgesic: Secondary | ICD-10-CM | POA: Diagnosis not present

## 2016-04-29 DIAGNOSIS — D72829 Elevated white blood cell count, unspecified: Secondary | ICD-10-CM | POA: Diagnosis present

## 2016-04-29 DIAGNOSIS — Z79899 Other long term (current) drug therapy: Secondary | ICD-10-CM | POA: Diagnosis not present

## 2016-04-29 DIAGNOSIS — I272 Pulmonary hypertension, unspecified: Secondary | ICD-10-CM | POA: Diagnosis present

## 2016-04-29 DIAGNOSIS — Z87891 Personal history of nicotine dependence: Secondary | ICD-10-CM

## 2016-04-29 DIAGNOSIS — Y92239 Unspecified place in hospital as the place of occurrence of the external cause: Secondary | ICD-10-CM | POA: Diagnosis not present

## 2016-04-29 DIAGNOSIS — I4581 Long QT syndrome: Secondary | ICD-10-CM | POA: Diagnosis not present

## 2016-04-29 DIAGNOSIS — J9621 Acute and chronic respiratory failure with hypoxia: Secondary | ICD-10-CM | POA: Diagnosis present

## 2016-04-29 DIAGNOSIS — R0781 Pleurodynia: Secondary | ICD-10-CM | POA: Diagnosis not present

## 2016-04-29 DIAGNOSIS — Z7901 Long term (current) use of anticoagulants: Secondary | ICD-10-CM | POA: Diagnosis not present

## 2016-04-29 DIAGNOSIS — R079 Chest pain, unspecified: Secondary | ICD-10-CM | POA: Diagnosis not present

## 2016-04-29 LAB — BASIC METABOLIC PANEL
ANION GAP: 7 (ref 5–15)
BUN: 12 mg/dL (ref 6–20)
CALCIUM: 9.2 mg/dL (ref 8.9–10.3)
CO2: 21 mmol/L — ABNORMAL LOW (ref 22–32)
CREATININE: 0.97 mg/dL (ref 0.61–1.24)
Chloride: 107 mmol/L (ref 101–111)
GFR calc Af Amer: >60 mL/min (ref >60)
GFR calc non Af Amer: >60 mL/min (ref >60)
GLUCOSE: 100 mg/dL — AB (ref 65–99)
Potassium: 3.5 mmol/L (ref 3.5–5.1)
Sodium: 135 mmol/L (ref 135–145)

## 2016-04-29 LAB — CBC
HCT: 22.9 % — ABNORMAL LOW (ref 39.0–52.0)
Hemoglobin: 8.1 g/dL — ABNORMAL LOW (ref 13.0–17.0)
MCH: 31.2 pg (ref 26.0–34.0)
MCHC: 35.4 g/dL (ref 30.0–36.0)
MCV: 88.1 fL (ref 78.0–100.0)
PLATELETS: 465 10*3/uL — AB (ref 150–400)
RBC: 2.6 MIL/uL — ABNORMAL LOW (ref 4.22–5.81)
RDW: 21.5 % — AB (ref 11.5–15.5)
WBC: 18.5 10*3/uL — ABNORMAL HIGH (ref 4.0–10.5)

## 2016-04-29 LAB — RETICULOCYTES
RBC.: 2.6 MIL/uL — AB (ref 4.22–5.81)
RBC.: 2.6 MIL/uL — ABNORMAL LOW (ref 4.22–5.81)
RETIC COUNT ABSOLUTE: 267.8 10*3/uL — AB (ref 19.0–186.0)
RETIC CT PCT: 10.3 % — AB (ref 0.4–3.1)
Retic Count, Absolute: 267.8 10*3/uL — ABNORMAL HIGH (ref 19.0–186.0)
Retic Ct Pct: 10.3 % — ABNORMAL HIGH (ref 0.4–3.1)

## 2016-04-29 LAB — CBC WITH DIFFERENTIAL/PLATELET
Basophils Absolute: 0 10*3/uL (ref 0.0–0.1)
Basophils Relative: 0 %
EOS PCT: 2 %
Eosinophils Absolute: 0.3 10*3/uL (ref 0.0–0.7)
HCT: 23.1 % — ABNORMAL LOW (ref 39.0–52.0)
HEMOGLOBIN: 8.1 g/dL — AB (ref 13.0–17.0)
Lymphocytes Relative: 9 %
Lymphs Abs: 1.3 10*3/uL (ref 0.7–4.0)
MCH: 31.2 pg (ref 26.0–34.0)
MCHC: 35.1 g/dL (ref 30.0–36.0)
MCV: 88.8 fL (ref 78.0–100.0)
MONOS PCT: 17 %
Monocytes Absolute: 2.5 10*3/uL — ABNORMAL HIGH (ref 0.1–1.0)
Neutro Abs: 10.6 10*3/uL — ABNORMAL HIGH (ref 1.7–7.7)
Neutrophils Relative %: 72 %
PLATELETS: 466 10*3/uL — AB (ref 150–400)
RBC: 2.6 MIL/uL — AB (ref 4.22–5.81)
RDW: 21.6 % — ABNORMAL HIGH (ref 11.5–15.5)
WBC: 14.7 10*3/uL — AB (ref 4.0–10.5)

## 2016-04-29 LAB — I-STAT TROPONIN, ED
TROPONIN I, POC: 0.08 ng/mL (ref 0.00–0.08)
Troponin i, poc: 0.07 ng/mL (ref 0.00–0.08)

## 2016-04-29 LAB — COMPREHENSIVE METABOLIC PANEL
ALBUMIN: 3.9 g/dL (ref 3.5–5.0)
ALT: 37 U/L (ref 17–63)
AST: 36 U/L (ref 15–41)
Alkaline Phosphatase: 106 U/L (ref 38–126)
Anion gap: 6 (ref 5–15)
BUN: 9 mg/dL (ref 6–20)
CHLORIDE: 109 mmol/L (ref 101–111)
CO2: 22 mmol/L (ref 22–32)
Calcium: 8.8 mg/dL — ABNORMAL LOW (ref 8.9–10.3)
Creatinine, Ser: 0.77 mg/dL (ref 0.61–1.24)
GFR calc Af Amer: >60 mL/min (ref >60)
GFR calc non Af Amer: >60 mL/min (ref >60)
GLUCOSE: 100 mg/dL — AB (ref 65–99)
POTASSIUM: 3.3 mmol/L — AB (ref 3.5–5.1)
Sodium: 137 mmol/L (ref 135–145)
Total Bilirubin: 5.5 mg/dL — ABNORMAL HIGH (ref 0.3–1.2)
Total Protein: 7.7 g/dL (ref 6.5–8.1)

## 2016-04-29 MED ORDER — KETOROLAC TROMETHAMINE 15 MG/ML IJ SOLN
15.0000 mg | INTRAMUSCULAR | Status: AC
  Administered 2016-04-29: 15 mg via INTRAVENOUS
  Filled 2016-04-29: qty 1

## 2016-04-29 MED ORDER — POTASSIUM CHLORIDE CRYS ER 20 MEQ PO TBCR
40.0000 meq | EXTENDED_RELEASE_TABLET | Freq: Once | ORAL | Status: AC
  Administered 2016-04-29: 40 meq via ORAL
  Filled 2016-04-29: qty 2

## 2016-04-29 MED ORDER — HYDROMORPHONE HCL 1 MG/ML IJ SOLN
2.0000 mg | INTRAMUSCULAR | Status: AC
  Administered 2016-04-29: 2 mg via INTRAVENOUS
  Filled 2016-04-29: qty 2

## 2016-04-29 MED ORDER — SODIUM CHLORIDE 0.45 % IV SOLN
INTRAVENOUS | Status: DC
  Administered 2016-04-29: 09:00:00 via INTRAVENOUS

## 2016-04-29 MED ORDER — HYDROMORPHONE HCL 1 MG/ML IJ SOLN: 2.0000 mg | INTRAMUSCULAR | Status: AC

## 2016-04-29 MED ORDER — SODIUM CHLORIDE 0.45 % IV SOLN
INTRAVENOUS | Status: DC
  Administered 2016-04-29: 20:00:00 via INTRAVENOUS

## 2016-04-29 MED ORDER — HYDROMORPHONE HCL 1 MG/ML IJ SOLN: 2.0000 mg | INTRAMUSCULAR | Status: DC

## 2016-04-29 MED ORDER — DIPHENHYDRAMINE HCL 25 MG PO CAPS
25.0000 mg | ORAL_CAPSULE | ORAL | Status: DC | PRN
  Administered 2016-04-29: 25 mg via ORAL
  Filled 2016-04-29: qty 1

## 2016-04-29 MED ORDER — DIPHENHYDRAMINE HCL 50 MG/ML IJ SOLN
25.0000 mg | Freq: Once | INTRAMUSCULAR | Status: AC
  Administered 2016-04-29: 25 mg via INTRAVENOUS
  Filled 2016-04-29: qty 1

## 2016-04-29 MED ORDER — HEPARIN SOD (PORK) LOCK FLUSH 100 UNIT/ML IV SOLN
500.0000 [IU] | Freq: Once | INTRAVENOUS | Status: AC
  Administered 2016-04-29: 500 [IU]
  Filled 2016-04-29: qty 5

## 2016-04-29 NOTE — ED Notes (Signed)
EDP notified of does #3. Pain 8/10.

## 2016-04-29 NOTE — ED Notes (Addendum)
Labs given to [place] and nures .

## 2016-04-29 NOTE — ED Notes (Signed)
Chest port being assessed by this RN. Blood work and medication to be administered via port. Per pt request.

## 2016-04-29 NOTE — ED Provider Notes (Addendum)
[facility] DEPT Provider Note   CSN: [id number] Arrival date & time: 04/29/16  3419     History   Chief Complaint Chief Complaint  Patient presents with  . Sickle Cell Pain Crisis  . Chest Pain    HPI [name] is a 36 y.o. male.  HPI  Pt comes in with cc of chest pain. Pt has hx of PE, sickle cell anemia. Pt is on xarelto, and reports that he has been taking his meds as prescribed. PT reports that he started having chest pain in the afternoon. Chest pain is bwlow his ribs bilaterally and in the substernal region. Pain is not worse with deep breathing. PT has cough, but denies any productive phlegm. There is no fevers.   Past Medical History:  Diagnosis Date  . Acute chest syndrome ([place]) 06/18/2013  . Acute embolism and thrombosis of right internal jugular vein ([name])   . Alcohol consumption of one to four drinks per day   . Avascular necrosis (HCC)    Right Hip  . Blood transfusion   . Chronic anticoagulation   . Demand ischemia ([name]) 01/02/2014  . Former smoker   . Functional asplenia   . Hb-SS disease with crisis ([name])   . History of Clostridium difficile infection   . History of pulmonary embolus (PE)   . Hypertension   . Hypokalemia   . Leukocytosis    Chronic  . Mood disorder ([name])   . Noncompliance with medication regimen   . Oxygen deficiency   . Pulmonary hypertension   . Second hand tobacco smoke exposure   . Sickle cell anemia (HCC)   . Sickle-cell crisis with associated acute chest syndrome ([place]) 05/13/2013  . Stroke ([name])   . Thrombocytosis (HCC)    Chronic  . Uses marijuana     Patient Active Problem List   Diagnosis Date Noted  . Acute pulmonary edema ([name]) 04/11/2016  . H/O arterial ischemic stroke 11/11/2015  . Slurred speech 11/10/2015  . Chronic anemia   . 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 Acute on chronic respiratory failure with hypoxia ([name])   . Prolonged Q-T interval on ECG 09/10/2014  . Troponin level  elevated 08/29/2014  . Cor pulmonale, chronic ([name]) 08/25/2014  . Sickle cell anemia ([name]) 06/25/2014  . Anemia of chronic disease 06/25/2014  . PAH (pulmonary artery hypertension) 03/18/2014  . Chronic respiratory failure with hypoxia ([place]) 03/14/2014  . Paralytic strabismus, external ophthalmoplegia   . Chronic pain syndrome 12/12/2013  . Chronic anticoagulation 08/22/2013  . Essential hypertension 08/22/2013  . Pulmonary HTN 06/18/2013  . Functional asplenia   . Vitamin D deficiency 02/13/2013  . Rib pain 01/13/2013  . Hx of pulmonary embolus 06/29/2012  . 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 Avascular necrosis ([name])   . Sickle cell pain crisis [facility])     Past Surgical History:  Procedure Laterality Date  . CHOLECYSTECTOMY     01/2008  . Excision of left periauricular cyst     10/2009  . Excision of right ear lobe cyst with primary closur     11/2007  . Porta cath placement    . Porta cath removal    . PORTACATH PLACEMENT  01/05/2012   Procedure: INSERTION PORT-A-CATH;  Surgeon: Odis Hollingshead, MD;  Location: [facility];  Service: General;  Laterality: N/A;  ultrasound guiced port a cath insertion with fluoroscopy  . Right hip replacement     08/2006  . UMBILICAL HERNIA REPAIR  01/2008       Home Medications    Prior to Admission medications   Medication Sig Start Date End Date Taking? Authorizing Provider  ambrisentan (LETAIRIS) 5 MG tablet Take 5 mg by mouth daily.    Yes Historical Provider, MD  aspirin 81 MG chewable tablet Chew 1 tablet (81 mg total) by mouth daily. 07/23/14  Yes [name], MD  carvedilol (COREG) 3.125 MG tablet Take 1 tablet (3.125 mg total) by mouth 2 (two) times daily with a meal. 03/17/16  Yes [name], MD  cholecalciferol (VITAMIN D) 1000 units tablet Take 2,000 Units by mouth daily.   Yes Historical Provider, MD  folic acid (FOLVITE) 1 MG tablet Take 2 tablets (2 mg total) by mouth daily. 04/25/16  Yes [name], MD  gabapentin (NEURONTIN) 300 MG capsule Take 1 capsule (300 mg total) by mouth 3 (three) times daily. 06/01/15  Yes Tresa Garter, MD  HYDROmorphone (DILAUDID) 4 MG tablet Take 2 tablets (8 mg total) by mouth every 4 (four) hours as needed for moderate pain or severe pain. 04/25/16 05/10/16 Yes [name], MD  hydroxyurea (HYDREA) 500 MG capsule Take 1000 mg QOD and alternate with 500 mg QOD. May take with food to minimize GI side effects. Patient taking differently: Take 500-1,000 mg by mouth See admin instructions. Take 1000 mg QOD and alternate with 500 mg QOD. May take with food to minimize GI side effects. 04/25/16  Yes [name], MD  L-glutamine (ENDARI) 5 g PACK Powder Packet Take 15 g by mouth 2 (two) times daily. 04/25/16  Yes [name], MD  morphine (MS CONTIN) 30 MG 12 hr tablet Take 1 tablet (30 mg total) by mouth every 12 (twelve) hours. 04/05/16 05/05/16 Yes Olugbemiga Essie Christine, MD  potassium chloride SA (K-DUR,KLOR-CON) 20 MEQ tablet Take 2 tablets (40 mEq total) by mouth daily. 04/27/16  Yes [name], MD  rivaroxaban (XARELTO) 20 MG TABS tablet Take 1 tablet (20 mg total) by mouth at bedtime. 12/29/15  Yes Tresa Garter, MD  zolpidem (AMBIEN) 10 MG tablet Take 1 tablet (10 mg total) by mouth at bedtime as needed for sleep. 04/05/16 05/05/16 Yes Olugbemiga Essie Christine, MD  torsemide (DEMADEX) 20 MG tablet Take 1 tablet (20 mg total) by mouth daily. Patient not taking: Reported on 04/29/2016 04/26/16   [name], MD    Family History Family History  Problem Relation Age of Onset  . Sickle cell trait Mother   . Depression Mother   . Diabetes Mother   . Sickle cell trait Father   . Sickle cell trait Brother     Social History Social History  Substance Use Topics  . Smoking status: Former Smoker    Packs/day: 0.50    Years: 10.00    Types: Cigarettes    Quit date: 05/29/2011  . Smokeless tobacco: Never Used  . Alcohol  use No     Allergies   Patient has no known allergies.   Review of Systems Review of Systems  ROS 10 Systems reviewed and are negative for acute change except as noted in the HPI.    Physical Exam Updated Vital Signs BP 121/83   Pulse 80   Temp 98.8 F (37.1 C) (Oral)   Resp (!) 23   Ht 6' (1.829 m)   Wt 185 lb (83.9 kg)   SpO2 93%   BMI 25.09 kg/m   Physical Exam  Constitutional:  He is oriented to person, place, and time. He appears well-developed.  HENT:  Head: Normocephalic and atraumatic.  Eyes: Conjunctivae and EOM are normal. Pupils are equal, round, and reactive to light.  Neck: Normal range of motion. Neck supple.  Cardiovascular: Normal rate and regular rhythm.   Pulmonary/Chest: Effort normal and breath sounds normal. No respiratory distress. He has no wheezes. He has no rales.  Abdominal: Soft. Bowel sounds are normal. He exhibits no distension and no mass. There is no tenderness. There is no rebound and no guarding.  Musculoskeletal: He exhibits no deformity.  Neurological: He is alert and oriented to person, place, and time.  Skin: Skin is warm.  Nursing note and vitals reviewed.    ED Treatments / Results  Labs (all labs ordered are listed, but only abnormal results are displayed) Labs Reviewed  BASIC METABOLIC PANEL - Abnormal; Notable for the following:       Result Value   CO2 21 (*)    Glucose, Bld 100 (*)    All other components within normal limits  CBC - Abnormal; Notable for the following:    WBC 18.5 (*)    RBC 2.60 (*)    Hemoglobin 8.1 (*)    HCT 22.9 (*)    RDW 21.5 (*)    Platelets 465 (*)    All other components within normal limits  RETICULOCYTES - Abnormal; Notable for the following:    Retic Ct Pct 10.3 (*)    RBC. 2.60 (*)    Retic Count, Manual 267.8 (*)    All other components within normal limits  I-STAT TROPOININ, ED  I-STAT TROPOININ, ED    EKG  EKG Interpretation  Date/Time:  Friday April 29 2016 19:09:03  EDT Ventricular Rate:  92 PR Interval:    QRS Duration: 126 QT Interval:  387 QTC Calculation: 479 R Axis:   -93 Text Interpretation:  Sinus rhythm Multiform ventricular premature complexes Borderline prolonged PR interval LAE, consider biatrial enlargement Nonspecific IVCD with LAD Inferior infarct, old Abnormal lateral Q waves No acute changes Confirmed by Kathrynn Humble, MD, Thelma Comp [phone]) on 04/29/2016 8:02:02 PM       Radiology Dg Chest 2 View  Result Date: 04/29/2016 CLINICAL DATA:  Acute onset of central chest throbbing and rib pain. Initial encounter. EXAM: CHEST  2 VIEW COMPARISON:  Chest radiograph performed 04/22/2016 FINDINGS: The lungs are well-aerated. Mild vascular congestion is noted. There is no evidence of focal opacification, pleural effusion or pneumothorax. The heart is enlarged. A left-sided chest port noted ending about the distal SVC. No acute osseous abnormalities are seen. Clips are noted within the right upper quadrant, reflecting prior cholecystectomy. IMPRESSION: Mild vascular congestion and cardiomegaly. Lungs remain grossly clear. Electronically Signed   By: Garald Balding M.D.   On: 04/29/2016 19:53    Procedures Procedures (including critical care time)  Medications Ordered in ED Medications  0.45 % sodium chloride infusion ( Intravenous Stopped 04/30/16 0012)  diphenhydrAMINE (BENADRYL) capsule 25-50 mg (25 mg Oral Given 04/29/16 2013)  ketorolac (TORADOL) 15 MG/ML injection 15 mg (15 mg Intravenous Given 04/29/16 2013)  HYDROmorphone (DILAUDID) injection 2 mg (2 mg Intravenous Given 04/29/16 2013)    Or  HYDROmorphone (DILAUDID) injection 2 mg ( Subcutaneous See Alternative 04/29/16 2013)  HYDROmorphone (DILAUDID) injection 2 mg (2 mg Intravenous Given 04/29/16 2113)    Or  HYDROmorphone (DILAUDID) injection 2 mg ( Subcutaneous See Alternative 04/29/16 2113)  HYDROmorphone (DILAUDID) injection 2 mg (2 mg Intravenous Given 04/29/16  2147)    Or  HYDROmorphone  (DILAUDID) injection 2 mg ( Subcutaneous See Alternative 04/29/16 2147)     Initial Impression / Assessment and Plan / ED Course  I have reviewed the triage vital signs and the nursing notes.  Pertinent labs & imaging results that were available during my care of the patient were reviewed by me and considered in my medical decision making (see chart for details).  Clinical Course as of Apr 30 24  Fri Apr 29, 2016  2217 Pt has refused admission to the hospitalist.  I went and spoke with the patient and informed him the rationale for admission.  Patient wants to leave against medical advice. Patient understands that his actions will lead to inadequate medical workup, and that he is at risk of complications of missed diagnosis, which includes morbidity and mortality.  Alternative options discussed - close follow up with [place] if symptoms get worse - which is what patient wants to do. Opportunity to change mind given - and pt will inform me if he changes his mind. Patient is demonstrating good capacity to make decision. Patient understands that he/she needs to return to the ER immediately if his/her symptoms get worse.    [AN]    Clinical Course User Index [AN] Varney Biles, MD    Pt comes in with cc of chest pain. Hx of sickle cell anemia - typically doesn't get chest pain. Pt has hx of PE - but the current pain is not pleuritic. Pt has cough, no fevers. Pt is noted to have some desaturation in the upper 80s / low 90s here.  DDX: Acute chest ACS PE Sickle cell pain crisis  CXR shows no new infiltrates. I think we will monitor patient closely, but no need to start treatment specific to ACS (transfusion etc.). We will start hydration. The symptoms started just few hours back, so there could be an evolving process not seen on CXR yet. Lungs exam is nonfocal.  Pt has had 3 CT PE this year - I don;t think pretest probability for PE is very high, and we will not get CT.  Admit to  medicine.   Final Clinical Impressions(s) / ED Diagnoses   Final diagnoses:  Sickle cell pain crisis ([place])  Precordial pain    New Prescriptions New Prescriptions   No medications on file     Varney Biles, MD 04/29/16 2137    Varney Biles, MD 04/30/16 [phone]

## 2016-04-29 NOTE — ED Triage Notes (Signed)
Pt complaint of constant central chest throbbing and rib pain consistent with SCC onset 0100 this morning.

## 2016-04-29 NOTE — ED Triage Notes (Signed)
Per pt, states sickle cell pain that started this am-pain in ankles and ribs

## 2016-04-29 NOTE — ED Notes (Signed)
Pt verbalized understanding of discharge instructions and denies any further questions at this time.   

## 2016-04-29 NOTE — ED Notes (Signed)
2L Nasal Canula placed.

## 2016-04-29 NOTE — ED Notes (Addendum)
With chart review pt discharge at 1130 today with complaint of leg/back pain. Pt states CP was not present then. This Probation officer confirmed chest pain start time pt repeat "one this morning."

## 2016-04-29 NOTE — ED Provider Notes (Signed)
[facility] DEPT Provider Note   CSN: [id number] Arrival date & time: 04/29/16  9937     History   Chief Complaint Chief Complaint  Patient presents with  . Sickle Cell Pain Crisis    HPI [name] is a 36 y.o. male.  36 year old male with history of sickle cell disease presents with acute painful crisis similar to prior episodes. Denies any fever, cough, congestion. Pain is localized to his back and legs. Denies any urinary symptoms. Used his home hydromorphone without relief. No recent illnesses. Nothing makes his symptoms worse.      Past Medical History:  Diagnosis Date  . Acute chest syndrome ([place]) 06/18/2013  . Acute embolism and thrombosis of right internal jugular vein ([place])   . Alcohol consumption of one to four drinks per day   . Avascular necrosis (HCC)    Right Hip  . Blood transfusion   . Chronic anticoagulation   . Demand ischemia ([place]) 01/02/2014  . Former smoker   . Functional asplenia   . Hb-SS disease with crisis ([place])   . History of Clostridium difficile infection   . History of pulmonary embolus (PE)   . Hypertension   . Hypokalemia   . Leukocytosis    Chronic  . Mood disorder ([place])   . Noncompliance with medication regimen   . Oxygen deficiency   . Pulmonary hypertension   . Second hand tobacco smoke exposure   . Sickle cell anemia (HCC)   . Sickle-cell crisis with associated acute chest syndrome ([place]) 05/13/2013  . Stroke ([place])   . Thrombocytosis (HCC)    Chronic  . Uses marijuana     Patient Active Problem List   Diagnosis Date Noted  . Acute pulmonary edema ([place]) 04/11/2016  . H/O arterial ischemic stroke 11/11/2015  . Slurred speech 11/10/2015  . Chronic anemia   . 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 Acute on chronic respiratory failure with hypoxia ([place])   . Prolonged Q-T interval on ECG 09/10/2014  . Troponin level elevated 08/29/2014  . Cor pulmonale, chronic ([place]) 08/25/2014  . Sickle cell anemia  ([place]) 06/25/2014  . Anemia of chronic disease 06/25/2014  . PAH (pulmonary artery hypertension) 03/18/2014  . Chronic respiratory failure with hypoxia ([place]) 03/14/2014  . Paralytic strabismus, external ophthalmoplegia   . Chronic pain syndrome 12/12/2013  . Chronic anticoagulation 08/22/2013  . Essential hypertension 08/22/2013  . Pulmonary HTN 06/18/2013  . Functional asplenia   . Vitamin D deficiency 02/13/2013  . Rib pain 01/13/2013  . Hx of pulmonary embolus 06/29/2012  . 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 Avascular necrosis ([place])   . Sickle cell pain crisis [facility])     Past Surgical History:  Procedure Laterality Date  . CHOLECYSTECTOMY     01/2008  . Excision of left periauricular cyst     10/2009  . Excision of right ear lobe cyst with primary closur     11/2007  . Porta cath placement    . Porta cath removal    . PORTACATH PLACEMENT  01/05/2012   Procedure: INSERTION PORT-A-CATH;  Surgeon: Odis Hollingshead, MD;  Location: [facility];  Service: General;  Laterality: N/A;  ultrasound guiced port a cath insertion with fluoroscopy  . Right hip replacement     08/2006  . UMBILICAL HERNIA REPAIR     01/2008       Home Medications    Prior to Admission medications   Medication Sig Start Date End Date Taking? Authorizing  Provider  ambrisentan (LETAIRIS) 5 MG tablet Take 5 mg by mouth daily.    Yes Historical Provider, MD  aspirin 81 MG chewable tablet Chew 1 tablet (81 mg total) by mouth daily. 07/23/14  Yes [name], MD  carvedilol (COREG) 3.125 MG tablet Take 1 tablet (3.125 mg total) by mouth 2 (two) times daily with a meal. 03/17/16  Yes [name], MD  cholecalciferol (VITAMIN D) 1000 units tablet Take 2,000 Units by mouth daily.   Yes Historical Provider, MD  folic acid (FOLVITE) 1 MG tablet Take 2 tablets (2 mg total) by mouth daily. 04/25/16  Yes [name], MD  gabapentin (NEURONTIN) 300 MG capsule Take 1 capsule (300 mg total) by mouth 3  (three) times daily. 06/01/15  Yes Tresa Garter, MD  HYDROmorphone (DILAUDID) 4 MG tablet Take 2 tablets (8 mg total) by mouth every 4 (four) hours as needed for moderate pain or severe pain. 04/25/16 05/10/16 Yes [name], MD  hydroxyurea (HYDREA) 500 MG capsule Take 1000 mg QOD and alternate with 500 mg QOD. May take with food to minimize GI side effects. Patient taking differently: Take 500-1,000 mg by mouth See admin instructions. Take 1000 mg QOD and alternate with 500 mg QOD. May take with food to minimize GI side effects. 04/25/16  Yes [name], MD  L-glutamine (ENDARI) 5 g PACK Powder Packet Take 15 g by mouth 2 (two) times daily. 04/25/16  Yes [name], MD  morphine (MS CONTIN) 30 MG 12 hr tablet Take 1 tablet (30 mg total) by mouth every 12 (twelve) hours. 04/05/16 05/05/16 Yes Olugbemiga Essie Christine, MD  potassium chloride SA (K-DUR,KLOR-CON) 20 MEQ tablet Take 2 tablets (40 mEq total) by mouth daily. 04/27/16  Yes [name], MD  rivaroxaban (XARELTO) 20 MG TABS tablet Take 1 tablet (20 mg total) by mouth at bedtime. 12/29/15  Yes Tresa Garter, MD  zolpidem (AMBIEN) 10 MG tablet Take 1 tablet (10 mg total) by mouth at bedtime as needed for sleep. 04/05/16 05/05/16 Yes Olugbemiga Essie Christine, MD  torsemide (DEMADEX) 20 MG tablet Take 1 tablet (20 mg total) by mouth daily. Patient not taking: Reported on 04/29/2016 04/26/16   [name], MD    Family History Family History  Problem Relation Age of Onset  . Sickle cell trait Mother   . Depression Mother   . Diabetes Mother   . Sickle cell trait Father   . Sickle cell trait Brother     Social History Social History  Substance Use Topics  . Smoking status: Former Smoker    Packs/day: 0.50    Years: 10.00    Types: Cigarettes    Quit date: 05/29/2011  . Smokeless tobacco: Never Used  . Alcohol use No     Allergies   Patient has no known allergies.   Review of Systems Review of  Systems  All other systems reviewed and are negative.    Physical Exam Updated Vital Signs BP 121/78   Pulse 86   Temp 99.2 F (37.3 C)   Resp 16   Wt 83.5 kg   SpO2 92%   BMI 24.95 kg/m   Physical Exam  Constitutional: He is oriented to person, place, and time. He appears well-developed and well-nourished.  Non-toxic appearance. No distress.  HENT:  Head: Normocephalic and atraumatic.  Eyes: Conjunctivae, EOM and lids are normal. Pupils are equal, round, and reactive to light.  Neck: Normal range  of motion. Neck supple. No tracheal deviation present. No thyroid mass present.  Cardiovascular: Normal rate, regular rhythm and normal heart sounds.  Exam reveals no gallop.   No murmur heard. Pulmonary/Chest: Effort normal and breath sounds normal. No stridor. No respiratory distress. He has no decreased breath sounds. He has no wheezes. He has no rhonchi. He has no rales.  Abdominal: Soft. Normal appearance and bowel sounds are normal. He exhibits no distension. There is no tenderness. There is no rebound and no CVA tenderness.  Musculoskeletal: Normal range of motion. He exhibits no edema or tenderness.  Neurological: He is alert and oriented to person, place, and time. He has normal strength. No cranial nerve deficit or sensory deficit. GCS eye subscore is 4. GCS verbal subscore is 5. GCS motor subscore is 6.  Skin: Skin is warm and dry. No abrasion and no rash noted.  Psychiatric: He has a normal mood and affect. His speech is normal and behavior is normal.  Nursing note and vitals reviewed.    ED Treatments / Results  Labs (all labs ordered are listed, but only abnormal results are displayed) Labs Reviewed  COMPREHENSIVE METABOLIC PANEL  CBC WITH DIFFERENTIAL/PLATELET  RETICULOCYTES    EKG  EKG Interpretation None       Radiology No results found.  Procedures Procedures (including critical care time)  Medications Ordered in ED Medications  HYDROmorphone  (DILAUDID) injection 2 mg (not administered)    Or  HYDROmorphone (DILAUDID) injection 2 mg (not administered)  HYDROmorphone (DILAUDID) injection 2 mg (not administered)    Or  HYDROmorphone (DILAUDID) injection 2 mg (not administered)  HYDROmorphone (DILAUDID) injection 2 mg (not administered)    Or  HYDROmorphone (DILAUDID) injection 2 mg (not administered)  HYDROmorphone (DILAUDID) injection 2 mg (not administered)    Or  HYDROmorphone (DILAUDID) injection 2 mg (not administered)  diphenhydrAMINE (BENADRYL) injection 25 mg (not administered)  0.45 % sodium chloride infusion (not administered)     Initial Impression / Assessment and Plan / ED Course  I have reviewed the triage vital signs and the nursing notes.  Pertinent labs & imaging results that were available during my care of the patient were reviewed by me and considered in my medical decision making (see chart for details).     Patient's potassium replenished here. Treated with IV hydromorphone 3 and now fills better. No concern for acute chest syndrome. Neurological exam remains stable. Patient would like to go home and manage the rest of his residual pain there.  Final Clinical Impressions(s) / ED Diagnoses   Final diagnoses:  None    New Prescriptions New Prescriptions   No medications on file     Lacretia Leigh, MD 04/29/16 1050

## 2016-04-30 ENCOUNTER — Inpatient Hospital Stay (HOSPITAL_COMMUNITY)
Admission: EM | Admit: 2016-04-30 | Discharge: 2016-05-08 | Disposition: A | Discharge status: Home / Self Care | Payer: Medicare Other | Source: Home / Self Care | Attending: Internal Medicine | Admitting: Internal Medicine

## 2016-04-30 ENCOUNTER — Encounter (HOSPITAL_COMMUNITY): Payer: Self-pay | Admitting: Nurse Practitioner

## 2016-04-30 DIAGNOSIS — G894 Chronic pain syndrome: Secondary | ICD-10-CM | POA: Diagnosis present

## 2016-04-30 DIAGNOSIS — D5700 Hb-SS disease with crisis, unspecified: Secondary | ICD-10-CM | POA: Diagnosis present

## 2016-04-30 DIAGNOSIS — I272 Pulmonary hypertension, unspecified: Secondary | ICD-10-CM | POA: Diagnosis present

## 2016-04-30 DIAGNOSIS — R0902 Hypoxemia: Secondary | ICD-10-CM

## 2016-04-30 DIAGNOSIS — I10 Essential (primary) hypertension: Secondary | ICD-10-CM | POA: Diagnosis present

## 2016-04-30 DIAGNOSIS — I2721 Secondary pulmonary arterial hypertension: Secondary | ICD-10-CM

## 2016-04-30 DIAGNOSIS — J9611 Chronic respiratory failure with hypoxia: Secondary | ICD-10-CM | POA: Diagnosis present

## 2016-04-30 MED ORDER — HYDROXYUREA 500 MG PO CAPS
500.0000 mg | ORAL_CAPSULE | ORAL | Status: DC
  Administered 2016-04-30 – 2016-05-08 (×5): 500 mg via ORAL
  Filled 2016-04-30 (×6): qty 1

## 2016-04-30 MED ORDER — DIPHENHYDRAMINE HCL 50 MG/ML IJ SOLN
INTRAMUSCULAR | Status: AC
  Administered 2016-04-30: 50 mg via INTRAVENOUS
  Filled 2016-04-30: qty 1

## 2016-04-30 MED ORDER — NALOXONE HCL 0.4 MG/ML IJ SOLN: 0.4000 mg | INTRAMUSCULAR | Status: DC | PRN

## 2016-04-30 MED ORDER — CARVEDILOL 3.125 MG PO TABS
3.1250 mg | ORAL_TABLET | Freq: Two times a day (BID) | ORAL | Status: DC
  Administered 2016-04-30 – 2016-05-08 (×18): 3.125 mg via ORAL
  Filled 2016-04-30 (×18): qty 1

## 2016-04-30 MED ORDER — L-GLUTAMINE ORAL POWDER
15.0000 g | PACK | Freq: Two times a day (BID) | ORAL | Status: DC
  Administered 2016-04-30 – 2016-05-08 (×17): 15 g via ORAL
  Filled 2016-04-30 (×18): qty 3

## 2016-04-30 MED ORDER — HYDROMORPHONE HCL 1 MG/ML IJ SOLN
2.0000 mg | Freq: Once | INTRAMUSCULAR | Status: AC
  Administered 2016-04-30: 2 mg via INTRAVENOUS
  Filled 2016-04-30: qty 2

## 2016-04-30 MED ORDER — RIVAROXABAN 20 MG PO TABS
20.0000 mg | ORAL_TABLET | Freq: Every day | ORAL | Status: DC
  Administered 2016-04-30 – 2016-05-01 (×2): 20 mg via ORAL
  Filled 2016-04-30 (×2): qty 1

## 2016-04-30 MED ORDER — AMBRISENTAN 5 MG PO TABS
5.0000 mg | ORAL_TABLET | Freq: Every day | ORAL | Status: DC
  Administered 2016-04-30 – 2016-05-06 (×7): 5 mg via ORAL

## 2016-04-30 MED ORDER — HYDROMORPHONE 1 MG/ML IV SOLN
INTRAVENOUS | Status: DC
  Administered 2016-04-30: 10.3 mg via INTRAVENOUS
  Administered 2016-04-30: 1 mg via INTRAVENOUS
  Administered 2016-04-30: 10.4 mg via INTRAVENOUS
  Administered 2016-04-30: 7.2 mg via INTRAVENOUS
  Administered 2016-04-30: 11.2 mg via INTRAVENOUS
  Administered 2016-04-30: 7.2 mg via INTRAVENOUS
  Administered 2016-04-30: 23:00:00 via INTRAVENOUS
  Administered 2016-05-01: 5.6 mg via INTRAVENOUS
  Administered 2016-05-01: 13.6 mg via INTRAVENOUS
  Administered 2016-05-01: 3.2 mg via INTRAVENOUS
  Administered 2016-05-01: 10.4 mg via INTRAVENOUS
  Administered 2016-05-01: 0.8 mg via INTRAVENOUS
  Administered 2016-05-02: via INTRAVENOUS
  Administered 2016-05-02: 12 mg via INTRAVENOUS
  Administered 2016-05-02: 10.4 mg via INTRAVENOUS
  Filled 2016-04-30 (×5): qty 25

## 2016-04-30 MED ORDER — GABAPENTIN 300 MG PO CAPS
300.0000 mg | ORAL_CAPSULE | Freq: Three times a day (TID) | ORAL | Status: DC
  Administered 2016-04-30 – 2016-05-08 (×26): 300 mg via ORAL
  Filled 2016-04-30 (×26): qty 1

## 2016-04-30 MED ORDER — DIPHENHYDRAMINE HCL 25 MG PO CAPS
25.0000 mg | ORAL_CAPSULE | ORAL | Status: DC | PRN
  Administered 2016-04-30: 50 mg via ORAL
  Administered 2016-05-01: 25 mg via ORAL
  Administered 2016-05-01 – 2016-05-02 (×2): 50 mg via ORAL
  Administered 2016-05-02 – 2016-05-07 (×6): 25 mg via ORAL
  Filled 2016-04-30 (×5): qty 1
  Filled 2016-04-30: qty 2
  Filled 2016-04-30: qty 1
  Filled 2016-04-30: qty 2
  Filled 2016-04-30 (×2): qty 1
  Filled 2016-04-30: qty 2

## 2016-04-30 MED ORDER — DEXTROSE-NACL 5-0.45 % IV SOLN
INTRAVENOUS | Status: DC
  Administered 2016-04-30 – 2016-05-06 (×4): via INTRAVENOUS

## 2016-04-30 MED ORDER — HYDROXYUREA 500 MG PO CAPS
1000.0000 mg | ORAL_CAPSULE | ORAL | Status: DC
  Administered 2016-05-01 – 2016-05-07 (×4): 1000 mg via ORAL
  Filled 2016-04-30 (×5): qty 2

## 2016-04-30 MED ORDER — FOLIC ACID 1 MG PO TABS
2.0000 mg | ORAL_TABLET | Freq: Every day | ORAL | Status: DC
  Administered 2016-04-30 – 2016-05-08 (×9): 2 mg via ORAL
  Filled 2016-04-30 (×9): qty 2

## 2016-04-30 MED ORDER — SENNOSIDES-DOCUSATE SODIUM 8.6-50 MG PO TABS
1.0000 | ORAL_TABLET | Freq: Two times a day (BID) | ORAL | Status: DC
  Administered 2016-04-30 – 2016-05-08 (×17): 1 via ORAL
  Filled 2016-04-30 (×17): qty 1

## 2016-04-30 MED ORDER — AMBRISENTAN 5 MG PO TABS
5.0000 mg | ORAL_TABLET | Freq: Every day | ORAL | Status: DC
  Filled 2016-04-30: qty 1

## 2016-04-30 MED ORDER — MORPHINE SULFATE ER 30 MG PO TBCR
30.0000 mg | EXTENDED_RELEASE_TABLET | Freq: Two times a day (BID) | ORAL | Status: DC
  Administered 2016-04-30 – 2016-05-08 (×17): 30 mg via ORAL
  Filled 2016-04-30 (×17): qty 1

## 2016-04-30 MED ORDER — ASPIRIN 81 MG PO CHEW
81.0000 mg | CHEWABLE_TABLET | Freq: Every day | ORAL | Status: DC
  Administered 2016-04-30 – 2016-05-08 (×9): 81 mg via ORAL
  Filled 2016-04-30 (×9): qty 1

## 2016-04-30 MED ORDER — SODIUM CHLORIDE 0.9% FLUSH: 9.0000 mL | INTRAVENOUS | Status: DC | PRN

## 2016-04-30 MED ORDER — HYDROMORPHONE HCL 4 MG PO TABS: 4.0000 mg | ORAL_TABLET | ORAL | Status: DC | PRN

## 2016-04-30 MED ORDER — ONDANSETRON HCL 4 MG/2ML IJ SOLN: 4.0000 mg | Freq: Four times a day (QID) | INTRAMUSCULAR | Status: DC | PRN

## 2016-04-30 MED ORDER — POLYETHYLENE GLYCOL 3350 17 G PO PACK
17.0000 g | PACK | Freq: Every day | ORAL | Status: DC | PRN
  Administered 2016-05-02: 17 g via ORAL
  Filled 2016-04-30: qty 1

## 2016-04-30 MED ORDER — DIPHENHYDRAMINE HCL 50 MG/ML IJ SOLN
50.0000 mg | Freq: Once | INTRAMUSCULAR | Status: AC
  Administered 2016-04-30: 50 mg via INTRAVENOUS

## 2016-04-30 MED ORDER — SODIUM CHLORIDE 0.9 % IV SOLN
25.0000 mg | INTRAVENOUS | Status: DC | PRN
  Filled 2016-04-30: qty 0.5

## 2016-04-30 NOTE — ED Triage Notes (Signed)
Pt presents for evaluation of sickle cell pain and chest pain 8/10.

## 2016-04-30 NOTE — ED Notes (Signed)
Pt left without getting discharge papers from RN.  Will be discharged as AMA.

## 2016-04-30 NOTE — Progress Notes (Signed)
Patient ID: [name], male   DOB: 06-25-1980, 36 y.o.   MRN: [id number] Subjective:  [name] is a 36 y.o. male with a past medical history significant for sickle cell disease and pulmonary hypertension, chronic respiratory failure on home O2, hypertension, history of stroke, history of PE on Xarelto admitted yesterday for Sickle Cell pain crisis.He has no new complaint today. He still has pain 8/10 localized to his ribs. Denies SOB or difficulty breathing  Objective:  Vital signs in last 24 hours:  Vitals:   04/30/16 0509 04/30/16 0524 04/30/16 0823 04/30/16 0834  BP: 121/84     Pulse: 98     Resp: 20 20 19 19   Temp: 97.8 F (36.6 C)     TempSrc: Oral     SpO2: 91% 92% 92% 92%  Weight: 83.9 kg (184 lb 15.5 oz)     Height: 6' (1.829 m)       Intake/Output from previous day:   Intake/Output Summary (Last 24 hours) at 04/30/16 1204 Last data filed at 04/30/16 0819  Gross per 24 hour  Intake           314.33 ml  Output              250 ml  Net            64.33 ml    Physical Exam: General: Alert, awake, oriented x3, in no acute distress.  HEENT: [place]/AT PEERL, EOMI Neck: Trachea midline,  no masses, no thyromegal,y no JVD, no carotid bruit OROPHARYNX:  Moist, No exudate/ erythema/lesions.  Heart: Regular rate and rhythm, without murmurs, rubs, gallops, PMI non-displaced, no heaves or thrills on palpation.  Lungs: Clear to auscultation, no wheezing or rhonchi noted. No increased vocal fremitus resonant to percussion  Abdomen: Soft, nontender, nondistended, positive bowel sounds, no masses no hepatosplenomegaly noted..  Neuro: No focal neurological deficits noted cranial nerves II through XII grossly intact. DTRs 2+ bilaterally upper and lower extremities. Strength 5 out of 5 in bilateral upper and lower extremities. Musculoskeletal: No warm swelling or erythema around joints, no spinal tenderness noted. Psychiatric: Patient alert and oriented x3, good insight and  cognition, good recent to remote recall. Lymph node survey: No cervical axillary or inguinal lymphadenopathy noted.  Lab Results:  Basic Metabolic Panel:    Component Value Date/Time   NA 135 04/29/2016 1940   K 3.5 04/29/2016 1940   CL 107 04/29/2016 1940   CO2 21 (L) 04/29/2016 1940   BUN 12 04/29/2016 1940   CREATININE 0.97 04/29/2016 1940   CREATININE 0.64 08/22/2013 1235   GLUCOSE 100 (H) 04/29/2016 1940   CALCIUM 9.2 04/29/2016 1940   CBC:    Component Value Date/Time   WBC 18.5 (H) 04/29/2016 1940   HGB 8.1 (L) 04/29/2016 1940   HCT 22.9 (L) 04/29/2016 1940   PLT 465 (H) 04/29/2016 1940   MCV 88.1 04/29/2016 1940   NEUTROABS 10.6 (H) 04/29/2016 0855   LYMPHSABS 1.3 04/29/2016 0855   MONOABS 2.5 (H) 04/29/2016 0855   EOSABS 0.3 04/29/2016 0855   BASOSABS 0.0 04/29/2016 0855    No results found for this or any previous visit (from the past 240 hour(s)).  Studies/Results: Dg Chest 2 View  Result Date: 04/29/2016 CLINICAL DATA:  Acute onset of central chest throbbing and rib pain. Initial encounter. EXAM: CHEST  2 VIEW COMPARISON:  Chest radiograph performed 04/22/2016 FINDINGS: The lungs are well-aerated. Mild vascular congestion is noted. There is no evidence of  focal opacification, pleural effusion or pneumothorax. The heart is enlarged. A left-sided chest port noted ending about the distal SVC. No acute osseous abnormalities are seen. Clips are noted within the right upper quadrant, reflecting prior cholecystectomy. IMPRESSION: Mild vascular congestion and cardiomegaly. Lungs remain grossly clear. Electronically Signed   By: Garald Balding M.D.   On: 04/29/2016 19:53    Medications: Scheduled Meds: . ambrisentan  5 mg Oral Daily  . aspirin  81 mg Oral Daily  . carvedilol  3.125 mg Oral BID WC  . folic acid  2 mg Oral Daily  . gabapentin  300 mg Oral TID  . HYDROmorphone   Intravenous Q4H  . [START ON 05/01/2016] hydroxyurea  1,000 mg Oral Q48H  . hydroxyurea   500 mg Oral Q48H  . L-glutamine  15 g Oral BID  . morphine  30 mg Oral Q12H  . rivaroxaban  20 mg Oral QHS  . senna-docusate  1 tablet Oral BID   Continuous Infusions: . dextrose 5 % and 0.45% NaCl 50 mL/hr at 04/30/16 1203   Consultants:  None  Procedures:  None  Antibiotics:  None  Assessment/Plan: Principal Problem:   Sickle cell pain crisis ([place]) Active Problems:   Pulmonary HTN   Essential hypertension   Chronic pain syndrome   Chronic respiratory failure with hypoxia ([place])  1. Hb SS with crisis: Continue Dilaudid PCA dose at current dose setting. Continue Toradol, decrease patient's IVF rate to 50 cc/hr and stop tomorrow to prevent pulm edema. Start schedule oral Dilaudid. 2. Acute on Chronic Respiratory Failure with Hypoxia: He continues to have a pattern of increase in Oxygen requirements after being without Oxygen for an extended period of time. Although [facility] yet he does not use it 24 hours as directed. Patient continues to focus more on pain medications neglecting the consequences that may occur without use of oxygen.  3. Leukocytosis: Likely related to crisis.  4. Anemia of chronic disease:  Hb at baseline 5. Chronic pain: Continue MS Contin 6. Chronic Anticoagulation: Pt on Xarelto for recurrent PE's. Continue Xarelto.  Code Status: Full Code Family Communication: N/A Disposition Plan: Not yet ready for discharge  Ameliarose Shark  If 7PM-7AM, please contact night-coverage.  04/30/2016, 12:04 PM  LOS: 0 days

## 2016-04-30 NOTE — ED Notes (Signed)
EKG given to EDP,Wickline,MD., for review. 

## 2016-04-30 NOTE — ED Notes (Signed)
Does not want to be admitted and want sto go home.  MD Nanavati aware.  Ambulatory w/steady gait.  Pain 7/10.  A&Ox4.  Advised not to drive with pain medication still in his system.

## 2016-04-30 NOTE — ED Notes (Signed)
Bed: WA02 Expected date:  Expected time:  Means of arrival:  Comments: Sickle cell/chest pain

## 2016-04-30 NOTE — H&P (Addendum)
History and Physical  Patient Name: [name]     IRS:[id number]    DOB: 09-Apr-1980    DOA: 04/30/2016 PCP: [name], MD   Patient coming from: Home  Chief Complaint: Pain crisis  HPI: [name] is a 36 y.o. male with a past medical history significant for sickle cell disease and pulmonary hypertension, chronic respiratory failure on home O2, hypertension, history of stroke, history of PE on Xarelto who presents with complaints of sickle cell pain.  The patient was in his usual state of health until about 24 hours ago when he started to develop pain in his ribs, typical of his sickle cell pain crises. He was seen in the ER yesterday morning, treated with IV hydromorphone and discharged, within a few hours he developed pain in the area of his sternum, that was sharp, constant, severe, similar in character to his sickle cell pain, but new in the sense that it was located in his chest. This is not pleuritic, positional, exertional. It was not associated with fever, cough, difficulty breathing, shortness of breath, sputum production.  He did return to the emergency room tonight, had a normal troponin, chest x-ray without infiltrate, and ECG was no change from his previous. Admission was requested from [facility], primarily to observe the patient overnight given his new chest pain, but the patient felt he could manage well at home and asked to be discharged.  He returned within 2 hours with persistent, unrelieved pain in his chest and requested admission for PCA.  ED course: -Afebrile, heart rate 110, respirations 18, blood pressure 116/84, pulse oximetry 88% on room air, improved to 93% on nasal cannula -Na 137, K 3.3, Cr 0.77, WBC 14.7K, Hgb 8.1        ROS: Review of Systems  Constitutional: Negative for chills and fever.  Respiratory: Negative for cough, hemoptysis, sputum production, shortness of breath and wheezing.   Cardiovascular: Positive for chest pain. Negative  for palpitations.  All other systems reviewed and are negative.         Past Medical History:  Diagnosis Date  . Acute chest syndrome ([place]) 06/18/2013  . Acute embolism and thrombosis of right internal jugular vein ([place])   . Alcohol consumption of one to four drinks per day   . Avascular necrosis ([facility])    Right Hip  . Blood transfusion   . Chronic anticoagulation   . Demand ischemia ([place]) 01/02/2014  . Former smoker   . Functional asplenia   . Hb-SS disease with crisis ([place])   . History of Clostridium difficile infection   . History of pulmonary embolus (PE)   . Hypertension   . Hypokalemia   . Leukocytosis    Chronic  . Mood disorder ([place])   . Noncompliance with medication regimen   . Oxygen deficiency   . Pulmonary hypertension   . Second hand tobacco smoke exposure   . Sickle cell anemia (H[facility])   . Sickle-cell crisis with associated acute chest syndrome ([place]) 05/13/2013  . Stroke ([place])   . Thrombocytosis ([facility])    Chronic  . Uses marijuana     Past Surgical History:  Procedure Laterality Date  . CHOLECYSTECTOMY     01/2008  . Excision of left periauricular cyst     10/2009  . Excision of right ear lobe cyst with primary closur     11/2007  . Porta cath placement    . Porta cath removal    . PORTACATH PLACEMENT  01/05/2012  Procedure: INSERTION PORT-A-CATH;  Surgeon: Odis Hollingshead, MD;  Location: [facility];  Service: General;  Laterality: N/A;  ultrasound guiced port a cath insertion with fluoroscopy  . Right hip replacement     08/2006  . UMBILICAL HERNIA REPAIR     01/2008    Social History: Patient lives with his mother and brothers.  The patient walks unassisted.  He is from [place].  He does not smoke anymore.    No Known Allergies  Family history: family history includes Depression in his mother; Diabetes in his mother; Sickle cell trait in his brother, father, and mother.  Prior to Admission medications   Medication Sig Start Date End Date Taking?  Authorizing Provider  ambrisentan (LETAIRIS) 5 MG tablet Take 5 mg by mouth daily.    Yes Historical Provider, MD  aspirin 81 MG chewable tablet Chew 1 tablet (81 mg total) by mouth daily. 07/23/14  Yes [name], MD  carvedilol (COREG) 3.125 MG tablet Take 1 tablet (3.125 mg total) by mouth 2 (two) times daily with a meal. 03/17/16  Yes [name], MD  cholecalciferol (VITAMIN D) 1000 units tablet Take 2,000 Units by mouth daily.   Yes Historical Provider, MD  folic acid (FOLVITE) 1 MG tablet Take 2 tablets (2 mg total) by mouth daily. 04/25/16  Yes [name], MD  gabapentin (NEURONTIN) 300 MG capsule Take 1 capsule (300 mg total) by mouth 3 (three) times daily. 06/01/15  Yes Tresa Garter, MD  HYDROmorphone (DILAUDID) 4 MG tablet Take 2 tablets (8 mg total) by mouth every 4 (four) hours as needed for moderate pain or severe pain. 04/25/16 05/10/16 Yes [name], MD  hydroxyurea (HYDREA) 500 MG capsule Take 1000 mg QOD and alternate with 500 mg QOD. May take with food to minimize GI side effects. Patient taking differently: Take 500-1,000 mg by mouth See admin instructions. Take 1000 mg QOD and alternate with 500 mg QOD. May take with food to minimize GI side effects. 04/25/16  Yes [name], MD  L-glutamine (ENDARI) 5 g PACK Powder Packet Take 15 g by mouth 2 (two) times daily. 04/25/16  Yes [name], MD  morphine (MS CONTIN) 30 MG 12 hr tablet Take 1 tablet (30 mg total) by mouth every 12 (twelve) hours. 04/05/16 05/05/16 Yes Olugbemiga Essie Christine, MD  potassium chloride SA (K-DUR,KLOR-CON) 20 MEQ tablet Take 2 tablets (40 mEq total) by mouth daily. 04/27/16  Yes [name], MD  rivaroxaban (XARELTO) 20 MG TABS tablet Take 1 tablet (20 mg total) by mouth at bedtime. 12/29/15  Yes Tresa Garter, MD  zolpidem (AMBIEN) 10 MG tablet Take 1 tablet (10 mg total) by mouth at bedtime as needed for sleep. 04/05/16 05/05/16 Yes Olugbemiga Essie Christine,  MD  torsemide (DEMADEX) 20 MG tablet Take 1 tablet (20 mg total) by mouth daily. Patient not taking: Reported on 04/29/2016 04/26/16   [name], MD       Physical Exam: BP 101/73 (BP Location: Right Arm)   Pulse 96   Temp 98.3 F (36.8 C) (Oral)   Resp (!) 23   SpO2 93%  General appearance: Well-developed, adult male, alert and in moderate distress from pain.   Eyes: Ancteric, conjunctiva pink, lids and lashes normal. PERRL.    ENT: No nasal deformity, discharge, epistaxis.  Hearing normal. OP moist without lesions.   Neck: No neck masses.  Trachea midline.  No thyromegaly/tenderness. Lymph: No cervical or  supraclavicular lymphadenopathy. Skin: Warm and dry.  No suspicious rashes or lesions. Cardiac: RRR, nl S1-S2, no murmurs appreciated.  Capillary refill is brisk.  No LE edema.  Radial and DP pulses 2+ and symmetric. Respiratory: Normal respiratory rate and rhythm.  CTAB without rales or wheezes. Abdomen: Abdomen soft.  No TTP. No ascites, distension, hepatosplenomegaly.   MSK: No deformities or effusions.  No cyanosis or clubbing. Neuro: Cranial nerves grossly normal.  Sensation intact to light touch. Speech is fluent.  Muscle strength normal.    Psych: Sensorium intact and responding to questions, attention normal.  Behavior appropriate.  Affect blunted by pain.  Judgment and insight appear normal.     Labs on Admission:  I have personally reviewed following labs and imaging studies: CBC:  Recent Labs Lab 04/23/16 0530 04/24/16 0355 04/26/16 0437 04/29/16 0855 04/29/16 1940  WBC 16.1* 14.9* 16.7* 14.7* 18.5*  NEUTROABS  --  8.7* 11.0* 10.6*  --   HGB 7.3* 7.3* 7.5* 8.1* 8.1*  HCT 22.2* 22.1* 22.6* 23.1* 22.9*  MCV 89.9 89.1 89.7 88.8 88.1  PLT 500* 488* 480* 466* 539*   Basic Metabolic Panel:  Recent Labs Lab 04/24/16 0355 04/26/16 0437 04/29/16 0855 04/29/16 1940  NA 141 139 137 135  K 3.7 3.8 3.3* 3.5  CL 105 106 109 107  CO2 30 28 22  21*    GLUCOSE 126* 105* 100* 100*  BUN 24* 21* 9 12  CREATININE 1.04 1.02 0.77 0.97  CALCIUM 9.1 9.0 8.8* 9.2   GFR: Estimated Creatinine Clearance: 115.6 mL/min (by C-G formula based on SCr of 0.97 mg/dL).  Liver Function Tests:  Recent Labs Lab 04/24/16 0355 04/29/16 0855  AST 67* 36  ALT 51 37  ALKPHOS 119 106  BILITOT 3.5* 5.5*  PROT 7.6 7.7  ALBUMIN 4.1 3.9   No results for input(s): LIPASE, AMYLASE in the last 168 hours. No results for input(s): AMMONIA in the last 168 hours. Coagulation Profile: No results for input(s): INR, PROTIME in the last 168 hours. Cardiac Enzymes: No results for input(s): CKTOTAL, CKMB, CKMBINDEX, TROPONINI in the last 168 hours. BNP (last 3 results) No results for input(s): PROBNP in the last 8760 hours. HbA1C: No results for input(s): HGBA1C in the last 72 hours. CBG: No results for input(s): GLUCAP in the last 168 hours. Lipid Profile: No results for input(s): CHOL, HDL, LDLCALC, TRIG, CHOLHDL, LDLDIRECT in the last 72 hours. Thyroid Function Tests: No results for input(s): TSH, T4TOTAL, FREET4, T3FREE, THYROIDAB in the last 72 hours. Anemia Panel:  Recent Labs  04/29/16 0855 04/29/16 1940  RETICCTPCT 10.3* 10.3*   Sepsis Labs: Invalid input(s): PROCALCITONIN, LACTICIDVEN No results found for this or any previous visit (from the past 240 hour(s)).       Radiological Exams on Admission: Personally reviewed CXR shows no focal infiltrate: Dg Chest 2 View  Result Date: 04/29/2016 CLINICAL DATA:  Acute onset of central chest throbbing and rib pain. Initial encounter. EXAM: CHEST  2 VIEW COMPARISON:  Chest radiograph performed 04/22/2016 FINDINGS: The lungs are well-aerated. Mild vascular congestion is noted. There is no evidence of focal opacification, pleural effusion or pneumothorax. The heart is enlarged. A left-sided chest port noted ending about the distal SVC. No acute osseous abnormalities are seen. Clips are noted within the  right upper quadrant, reflecting prior cholecystectomy. IMPRESSION: Mild vascular congestion and cardiomegaly. Lungs remain grossly clear. Electronically Signed   By: Garald Balding M.D.   On: 04/29/2016 19:53    EKG:  Independently reviewed. Rate 92, QTc 479, precordial TWI are old.  IVCD is old.  No change from preivous.    Assessment/Plan    1. Vaso-occlussive sickle cell pain crisis: Will admit per sickle cell protocol for pain control.  No evidence of acute chest at this time.  Doubt acute chest.  Doubt recurrent PE given adherence to Xarelto.  Delta troponin negative in ER tonight.   -Weight based high-dose IV hydromorphone PCA with continuous ETCO2 monitoring  Loading dose 1.5, demand 0.8, Total hourly 4.8 -Continue home MS contin -IV Ketorolac deferred given anticoagulation and pulmonary hypertension and pulmonary edema during last hospitalization -Hydrate with IVF D5 .45% saline @ 100 cc/hr  -Follow I/Os -Continue hydroxyurea and folic acid -Diphenhydramine, ondansetron and bowel regimen -If worsening hypoxia or subjective dyspnea, or new cough/sputum overnight, will obtain repeat CXR and consider transfusion   2. Sickle cell anemia:  Baseline Hgb 7-8 -Transfuse as needed if Hg drops significantly below baseline.  3. Pulmonary hypertesnion:  -Continue ambrisentan  4. Hypertension:  Normotensive at admission -Continue carvedilol  5. Other medications:  -Continue gabapentin -Continue aspirin  6. History of PE:  -Continue Xarelto      DVT prophylaxis: N/a  Code Status: FULL  Family Communication: None present  Disposition Plan: Initiate IV hydromorphone PCA now, Sickle cell team to take over management in AM. Consults called: None Admission status: INPATIENT, med surg         Medical decision making: Patient seen at 4:34 AM on 04/30/2016.  The patient was discussed with Dr. Kathrynn Humble and Lorre Munroe, PA-C.  What exists of the patient's chart was  reviewed in depth and summarized above.  Clinical condition: currently hemodynamically stable with continuous respiratory monitoring.        Edwin Dada [facility] Pager [phone]

## 2016-04-30 NOTE — ED Provider Notes (Signed)
[facility] DEPT Provider Note   CSN: [id number] Arrival date & time: 04/30/16  0242     History   Chief Complaint Chief Complaint  Patient presents with  . Sickle Cell Pain Crisis  . Chest Pain    HPI [name] is a 36 y.o. male.  Patient with past medical history of sickle cell anemia, PE, and prior acute chest anticoagulated with Xarelto presents emergency department again tonight with chest pain and shortness of breath. Patient was here several hours ago, and was advised that he be admitted to the hospital over concern for acute chest syndrome. Patient declined, and left of his own volition. He returns now stating that he is agreeable with being admitted to the hospital. He complains of persistent right sided chest pain.  He denies SOB, but is noted to be hypoxic in triage.  He reports non-productive cough.  There are no other associated symptoms.   The history is provided by the patient. No language interpreter was used.    Past Medical History:  Diagnosis Date  . Acute chest syndrome ([place]) 06/18/2013  . Acute embolism and thrombosis of right internal jugular vein ([place])   . Alcohol consumption of one to four drinks per day   . Avascular necrosis (HCC)    Right Hip  . Blood transfusion   . Chronic anticoagulation   . Demand ischemia ([place]) 01/02/2014  . Former smoker   . Functional asplenia   . Hb-SS disease with crisis ([place])   . History of Clostridium difficile infection   . History of pulmonary embolus (PE)   . Hypertension   . Hypokalemia   . Leukocytosis    Chronic  . Mood disorder ([place])   . Noncompliance with medication regimen   . Oxygen deficiency   . Pulmonary hypertension   . Second hand tobacco smoke exposure   . Sickle cell anemia (HCC)   . Sickle-cell crisis with associated acute chest syndrome ([place]) 05/13/2013  . Stroke ([place])   . Thrombocytosis (HCC)    Chronic  . Uses marijuana     Patient Active Problem List   Diagnosis Date Noted    . Acute pulmonary edema ([place]) 04/11/2016  . H/O arterial ischemic stroke 11/11/2015  . Slurred speech 11/10/2015  . Chronic anemia   . Hb-SS disease without crisis ([place]) 10/07/2014  . Acute on chronic respiratory failure with hypoxia ([employer])   . Prolonged Q-T interval on ECG 09/10/2014  . Troponin level elevated 08/29/2014  . Cor pulmonale, chronic ([place]) 08/25/2014  . Sickle cell anemia ([place]) 06/25/2014  . Anemia of chronic disease 06/25/2014  . PAH (pulmonary artery hypertension) 03/18/2014  . Chronic respiratory failure with hypoxia ([place]) 03/14/2014  . Paralytic strabismus, external ophthalmoplegia   . Chronic pain syndrome 12/12/2013  . Chronic anticoagulation 08/22/2013  . Essential hypertension 08/22/2013  . Pulmonary HTN 06/18/2013  . Functional asplenia   . Vitamin D deficiency 02/13/2013  . Rib pain 01/13/2013  . Hx of pulmonary embolus 06/29/2012  . 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 Avascular necrosis ([place])   . Sickle cell pain crisis [facility])     Past Surgical History:  Procedure Laterality Date  . CHOLECYSTECTOMY     01/2008  . Excision of left periauricular cyst     10/2009  . Excision of right ear lobe cyst with primary closur     11/2007  . Porta cath placement    . Porta cath removal    . PORTACATH PLACEMENT  01/05/2012  Procedure: INSERTION PORT-A-CATH;  Surgeon: Odis Hollingshead, MD;  Location: [facility];  Service: General;  Laterality: N/A;  ultrasound guiced port a cath insertion with fluoroscopy  . Right hip replacement     08/2006  . UMBILICAL HERNIA REPAIR     01/2008       Home Medications    Prior to Admission medications   Medication Sig Start Date End Date Taking? Authorizing Provider  ambrisentan (LETAIRIS) 5 MG tablet Take 5 mg by mouth daily.    Yes Historical Provider, MD  aspirin 81 MG chewable tablet Chew 1 tablet (81 mg total) by mouth daily. 07/23/14  Yes [name], MD  carvedilol (COREG) 3.125 MG tablet Take 1 tablet  (3.125 mg total) by mouth 2 (two) times daily with a meal. 03/17/16  Yes [name], MD  cholecalciferol (VITAMIN D) 1000 units tablet Take 2,000 Units by mouth daily.   Yes Historical Provider, MD  folic acid (FOLVITE) 1 MG tablet Take 2 tablets (2 mg total) by mouth daily. 04/25/16  Yes [name], MD  gabapentin (NEURONTIN) 300 MG capsule Take 1 capsule (300 mg total) by mouth 3 (three) times daily. 06/01/15  Yes Tresa Garter, MD  HYDROmorphone (DILAUDID) 4 MG tablet Take 2 tablets (8 mg total) by mouth every 4 (four) hours as needed for moderate pain or severe pain. 04/25/16 05/10/16 Yes [name], MD  hydroxyurea (HYDREA) 500 MG capsule Take 1000 mg QOD and alternate with 500 mg QOD. May take with food to minimize GI side effects. Patient taking differently: Take 500-1,000 mg by mouth See admin instructions. Take 1000 mg QOD and alternate with 500 mg QOD. May take with food to minimize GI side effects. 04/25/16  Yes [name], MD  L-glutamine (ENDARI) 5 g PACK Powder Packet Take 15 g by mouth 2 (two) times daily. 04/25/16  Yes [name], MD  morphine (MS CONTIN) 30 MG 12 hr tablet Take 1 tablet (30 mg total) by mouth every 12 (twelve) hours. 04/05/16 05/05/16 Yes Olugbemiga Essie Christine, MD  potassium chloride SA (K-DUR,KLOR-CON) 20 MEQ tablet Take 2 tablets (40 mEq total) by mouth daily. 04/27/16  Yes [name], MD  rivaroxaban (XARELTO) 20 MG TABS tablet Take 1 tablet (20 mg total) by mouth at bedtime. 12/29/15  Yes Tresa Garter, MD  zolpidem (AMBIEN) 10 MG tablet Take 1 tablet (10 mg total) by mouth at bedtime as needed for sleep. 04/05/16 05/05/16 Yes Olugbemiga Essie Christine, MD  torsemide (DEMADEX) 20 MG tablet Take 1 tablet (20 mg total) by mouth daily. Patient not taking: Reported on 04/29/2016 04/26/16   [name], MD    Family History Family History  Problem Relation Age of Onset  . Sickle cell trait Mother   . Depression Mother    . Diabetes Mother   . Sickle cell trait Father   . Sickle cell trait Brother     Social History Social History  Substance Use Topics  . Smoking status: Former Smoker    Packs/day: 0.50    Years: 10.00    Types: Cigarettes    Quit date: 05/29/2011  . Smokeless tobacco: Never Used  . Alcohol use No     Allergies   Patient has no known allergies.   Review of Systems Review of Systems  Constitutional: Positive for fever.  Respiratory: Positive for cough. Negative for shortness of breath.   Cardiovascular: Positive for chest pain.  All other  systems reviewed and are negative.    Physical Exam Updated Vital Signs BP 116/84 (BP Location: Right Arm)   Pulse (!) 110   Temp 98.3 F (36.8 C) (Oral)   Resp 18   SpO2 (!) 88% Comment: RN,Ati made aware of pt. decreased oxygen level. pt.placed on 3 liters 02.  Physical Exam  Constitutional: He is oriented to person, place, and time. He appears well-developed and well-nourished.  HENT:  Head: Normocephalic and atraumatic.  Eyes: Conjunctivae and EOM are normal. Pupils are equal, round, and reactive to light. Right eye exhibits no discharge. Left eye exhibits no discharge. No scleral icterus.  Neck: Normal range of motion. Neck supple. No JVD present.  Cardiovascular: Normal heart sounds.  Exam reveals no gallop and no friction rub.   No murmur heard. tachycardic  Pulmonary/Chest: Effort normal and breath sounds normal. No respiratory distress. He has no wheezes. He has no rales. He exhibits no tenderness.  Abdominal: Soft. He exhibits no distension and no mass. There is no tenderness. There is no rebound and no guarding.  Musculoskeletal: Normal range of motion. He exhibits no edema or tenderness.  Neurological: He is alert and oriented to person, place, and time.  Skin: Skin is warm and dry.  Psychiatric: He has a normal mood and affect. His behavior is normal. Judgment and thought content normal.  Nursing note and vitals  reviewed.    ED Treatments / Results  Labs (all labs ordered are listed, but only abnormal results are displayed) Labs Reviewed - No data to display  EKG  EKG Interpretation  Date/Time:  Saturday April 30 2016 02:56:36 EDT Ventricular Rate:  107 PR Interval:    QRS Duration: 123 QT Interval:  342 QTC Calculation: 457 R Axis:   -110 Text Interpretation:  Sinus tachycardia Probable left atrial enlargement Nonspecific IVCD with LAD Inferior infarct, old Abnormal lateral Q waves Probable anterior infarct, age indeterminate No significant change since last tracing Confirmed by Christy Gentles  MD, [place] ([id number]) on 04/30/2016 3:05:18 AM       Radiology Dg Chest 2 View  Result Date: 04/29/2016 CLINICAL DATA:  Acute onset of central chest throbbing and rib pain. Initial encounter. EXAM: CHEST  2 VIEW COMPARISON:  Chest radiograph performed 04/22/2016 FINDINGS: The lungs are well-aerated. Mild vascular congestion is noted. There is no evidence of focal opacification, pleural effusion or pneumothorax. The heart is enlarged. A left-sided chest port noted ending about the distal SVC. No acute osseous abnormalities are seen. Clips are noted within the right upper quadrant, reflecting prior cholecystectomy. IMPRESSION: Mild vascular congestion and cardiomegaly. Lungs remain grossly clear. Electronically Signed   By: Garald Balding M.D.   On: 04/29/2016 19:53    Procedures Procedures (including critical care time)  Medications Ordered in ED Medications  HYDROmorphone (DILAUDID) injection 2 mg (not administered)     Initial Impression / Assessment and Plan / ED Course  I have reviewed the triage vital signs and the nursing notes.  Pertinent labs & imaging results that were available during my care of the patient were reviewed by me and considered in my medical decision making (see chart for details).     Patient seen just a few hours ago and was recommended admission, which the patient  declined.  He returns now stating that he is willing to come into the hospital now as originally planned.   Appreciated Dr. Loleta Books, who will admit the patient.  Final Clinical Impressions(s) / ED Diagnoses   Final diagnoses:  Sickle cell crisis [facility])    New Prescriptions New Prescriptions   No medications on file     Montine Circle, PA-C 04/30/16 [place], MD 05/01/16 [phone]

## 2016-05-01 MED ORDER — SODIUM CHLORIDE 0.9% FLUSH
10.0000 mL | INTRAVENOUS | Status: DC | PRN
  Administered 2016-05-02 – 2016-05-08 (×7): 10 mL
  Filled 2016-05-01 (×7): qty 40

## 2016-05-01 MED ORDER — ZOLPIDEM TARTRATE 10 MG PO TABS
10.0000 mg | ORAL_TABLET | Freq: Every evening | ORAL | Status: DC | PRN
  Administered 2016-05-01 – 2016-05-07 (×7): 10 mg via ORAL
  Filled 2016-05-01 (×7): qty 1

## 2016-05-01 NOTE — Progress Notes (Signed)
Patient ID: MAURIO BAIZE, male   DOB: 07/20/1980, 36 y.o.   MRN: [id number] Subjective:  Shelia Magallon Legetteis a 36 y.o.malewith a past medical history significant for sickle cell disease and pulmonary hypertension, chronic respiratory failure on home O2, hypertension, history of stroke, history of PE on Xarelto admitted yesterday for Sickle Cell pain crisis. Patient has no new complaint today but continues to have significant pain, he says his pain is at 8/10, only slightly better than yesterday. He however denies SOB or difficulty with breathing. He has no fever. Denies any urinary symptom.   Objective:  Vital signs in last 24 hours:  Vitals:   05/01/16 0405 05/01/16 0508 05/01/16 0800 05/01/16 1120  BP:  115/71    Pulse:  97    Resp: (!) 24 18 20  (!) 26  Temp:  98.1 F (36.7 C)    TempSrc:  Axillary    SpO2: 91% 97% 93% 92%  Weight:      Height:       Intake/Output from previous day:   Intake/Output Summary (Last 24 hours) at 05/01/16 1239 Last data filed at 05/01/16 1000  Gross per 24 hour  Intake           1942.5 ml  Output             1475 ml  Net            467.5 ml   Physical Exam: General: Alert, awake, oriented x3, in no acute distress.  HEENT: [place]/AT PEERL, EOMI Neck: Trachea midline,  no masses, no thyromegal,y no JVD, no carotid bruit OROPHARYNX:  Moist, No exudate/ erythema/lesions.  Heart: Regular rate and rhythm, loud P2, without murmurs, rubs, gallops.  Lungs: Clear to auscultation, no wheezing or rhonchi noted. No increased vocal fremitus resonant to percussion  Abdomen: Soft, nontender, nondistended, positive bowel sounds, no masses no hepatosplenomegaly noted..  Neuro: No focal neurological deficits noted cranial nerves II through XII grossly intact. DTRs 2+ bilaterally upper and lower extremities. Strength 5 out of 5 in bilateral upper and lower extremities. Musculoskeletal: No warm swelling or erythema around joints, no spinal tenderness  noted. Psychiatric: Patient alert and oriented x3, good insight and cognition, good recent to remote recall. Lymph node survey: No cervical axillary or inguinal lymphadenopathy noted.  Lab Results:  Basic Metabolic Panel:    Component Value Date/Time   NA 135 04/29/2016 1940   K 3.5 04/29/2016 1940   CL 107 04/29/2016 1940   CO2 21 (L) 04/29/2016 1940   BUN 12 04/29/2016 1940   CREATININE 0.97 04/29/2016 1940   CREATININE 0.64 08/22/2013 1235   GLUCOSE 100 (H) 04/29/2016 1940   CALCIUM 9.2 04/29/2016 1940   CBC:    Component Value Date/Time   WBC 18.5 (H) 04/29/2016 1940   HGB 8.1 (L) 04/29/2016 1940   HCT 22.9 (L) 04/29/2016 1940   PLT 465 (H) 04/29/2016 1940   MCV 88.1 04/29/2016 1940   NEUTROABS 10.6 (H) 04/29/2016 0855   LYMPHSABS 1.3 04/29/2016 0855   MONOABS 2.5 (H) 04/29/2016 0855   EOSABS 0.3 04/29/2016 0855   BASOSABS 0.0 04/29/2016 0855   Studies/Results: Dg Chest 2 View  Result Date: 04/29/2016 CLINICAL DATA:  Acute onset of central chest throbbing and rib pain. Initial encounter. EXAM: CHEST  2 VIEW COMPARISON:  Chest radiograph performed 04/22/2016 FINDINGS: The lungs are well-aerated. Mild vascular congestion is noted. There is no evidence of focal opacification, pleural effusion or pneumothorax. The heart is enlarged. A  left-sided chest port noted ending about the distal SVC. No acute osseous abnormalities are seen. Clips are noted within the right upper quadrant, reflecting prior cholecystectomy. IMPRESSION: Mild vascular congestion and cardiomegaly. Lungs remain grossly clear. Electronically Signed   By: Garald Balding M.D.   On: 04/29/2016 19:53   Medications: Scheduled Meds: . ambrisentan  5 mg Oral Daily  . aspirin  81 mg Oral Daily  . carvedilol  3.125 mg Oral BID WC  . folic acid  2 mg Oral Daily  . gabapentin  300 mg Oral TID  . HYDROmorphone   Intravenous Q4H  . hydroxyurea  1,000 mg Oral Q48H  . hydroxyurea  500 mg Oral Q48H  . L-glutamine  15  g Oral BID  . morphine  30 mg Oral Q12H  . rivaroxaban  20 mg Oral QHS  . senna-docusate  1 tablet Oral BID   Continuous Infusions: . dextrose 5 % and 0.45% NaCl 50 mL/hr at 04/30/16 1953   Consultants:  None  Procedures:  None  Antibiotics:  None  Assessment/Plan: Principal Problem:   Sickle cell pain crisis ([place]) Active Problems:   Pulmonary HTN   Essential hypertension   Chronic pain syndrome   Chronic respiratory failure with hypoxia ([place])  1. Hb SS with crisis: Slightly better today. Continue Dilaudid PCA at current dose setting. Continue Toradol, continue IVF at 50 cc/hr and D/C tomorrow to prevent pulm edema. Continue schedule oral Dilaudid. 2. Acute on Chronic Respiratory Failure with Hypoxia: Patient continues to have a pattern of increase in Oxygen requirements after being without Oxygen for an extended period of time. Although [facility] yet he remains non-adherent, does not use it 24 hours as directed. Patient continues to focus more on pain medications neglecting the consequences that may occur without use of oxygen.  3. Leukocytosis: Likely related to crisis, will monitor 4. Anemia of chronic disease:Hb at baseline, monitor 5. Chronic pain: Continue MS Contin 6. Chronic Anticoagulation: Pt on Xarelto for recurrent PE's. Continue Xarelto.  Code Status: Full Code Family Communication: N/A Disposition Plan: Not yet ready for discharge  Sidonie Dexheimer  If 7PM-7AM, please contact night-coverage.  05/01/2016, 12:39 PM  LOS: 1 day

## 2016-05-02 DIAGNOSIS — G894 Chronic pain syndrome: Secondary | ICD-10-CM

## 2016-05-02 DIAGNOSIS — D638 Anemia in other chronic diseases classified elsewhere: Secondary | ICD-10-CM

## 2016-05-02 DIAGNOSIS — J9611 Chronic respiratory failure with hypoxia: Secondary | ICD-10-CM

## 2016-05-02 DIAGNOSIS — I10 Essential (primary) hypertension: Secondary | ICD-10-CM

## 2016-05-02 DIAGNOSIS — D5700 Hb-SS disease with crisis, unspecified: Principal | ICD-10-CM

## 2016-05-02 LAB — BASIC METABOLIC PANEL
ANION GAP: 6 (ref 5–15)
BUN: 13 mg/dL (ref 6–20)
CALCIUM: 8.5 mg/dL — AB (ref 8.9–10.3)
CHLORIDE: 107 mmol/L (ref 101–111)
CO2: 22 mmol/L (ref 22–32)
Creatinine, Ser: 0.71 mg/dL (ref 0.61–1.24)
GFR calc Af Amer: >60 mL/min (ref >60)
GFR calc non Af Amer: >60 mL/min (ref >60)
GLUCOSE: 122 mg/dL — AB (ref 65–99)
POTASSIUM: 3.6 mmol/L (ref 3.5–5.1)
Sodium: 135 mmol/L (ref 135–145)

## 2016-05-02 LAB — CBC WITH DIFFERENTIAL/PLATELET
BASOS ABS: 0.2 10*3/uL — AB (ref 0.0–0.1)
BASOS PCT: 1 %
Eosinophils Absolute: 0.8 10*3/uL — ABNORMAL HIGH (ref 0.0–0.7)
Eosinophils Relative: 5 %
HEMATOCRIT: 20.4 % — AB (ref 39.0–52.0)
HEMOGLOBIN: 7.1 g/dL — AB (ref 13.0–17.0)
LYMPHS ABS: 2.8 10*3/uL (ref 0.7–4.0)
LYMPHS PCT: 18 %
MCH: 31 pg (ref 26.0–34.0)
MCHC: 34.8 g/dL (ref 30.0–36.0)
MCV: 89.1 fL (ref 78.0–100.0)
MONOS PCT: 19 %
Monocytes Absolute: 3 10*3/uL — ABNORMAL HIGH (ref 0.1–1.0)
NEUTROS ABS: 8.9 10*3/uL — AB (ref 1.7–7.7)
Neutrophils Relative %: 57 %
Platelets: 454 10*3/uL — ABNORMAL HIGH (ref 150–400)
RBC: 2.29 MIL/uL — ABNORMAL LOW (ref 4.22–5.81)
RDW: 21.9 % — AB (ref 11.5–15.5)
WBC: 15.7 10*3/uL — ABNORMAL HIGH (ref 4.0–10.5)

## 2016-05-02 LAB — RETICULOCYTES
RBC.: 2.29 MIL/uL — AB (ref 4.22–5.81)
Retic Count, Absolute: 442 10*3/uL — ABNORMAL HIGH (ref 19.0–186.0)
Retic Ct Pct: 19.3 % — ABNORMAL HIGH (ref 0.4–3.1)

## 2016-05-02 LAB — LACTATE DEHYDROGENASE: LDH: 323 U/L — ABNORMAL HIGH (ref 98–192)

## 2016-05-02 MED ORDER — RIVAROXABAN 20 MG PO TABS
20.0000 mg | ORAL_TABLET | Freq: Every day | ORAL | Status: DC
  Administered 2016-05-02 – 2016-05-08 (×7): 20 mg via ORAL
  Filled 2016-05-02 (×7): qty 1

## 2016-05-02 MED ORDER — KETOROLAC TROMETHAMINE 30 MG/ML IJ SOLN
15.0000 mg | Freq: Four times a day (QID) | INTRAMUSCULAR | Status: DC
  Administered 2016-05-02 – 2016-05-03 (×3): 15 mg via INTRAVENOUS
  Filled 2016-05-02 (×4): qty 1

## 2016-05-02 MED ORDER — HYDROMORPHONE HCL 4 MG PO TABS
4.0000 mg | ORAL_TABLET | ORAL | Status: DC
  Administered 2016-05-02 – 2016-05-08 (×34): 4 mg via ORAL
  Filled 2016-05-02 (×36): qty 1

## 2016-05-02 MED ORDER — HYDROMORPHONE 1 MG/ML IV SOLN
INTRAVENOUS | Status: DC
  Administered 2016-05-02: 9.8 mg via INTRAVENOUS
  Administered 2016-05-02: 16.5 mg via INTRAVENOUS
  Administered 2016-05-02: 3.2 mg via INTRAVENOUS
  Administered 2016-05-03: 11.2 mg via INTRAVENOUS
  Administered 2016-05-03: 9.1 mg via INTRAVENOUS
  Administered 2016-05-03: 11:00:00 via INTRAVENOUS
  Filled 2016-05-02 (×3): qty 25

## 2016-05-02 NOTE — Progress Notes (Signed)
SATURATION QUALIFICATIONS: (This note is used to comply with regulatory documentation for home oxygen)  Patient Saturations on 4L of oxygen while at Rest= 93% HR 92  Patient Saturations on 4L of oxygen while Ambulating = 85% HR 96  Patient Saturations on 5 Liters of oxygen while Ambulating = 94% HR96  Patient Saturations on 5L of oxygen while at Rest= 95% HR 88

## 2016-05-02 NOTE — Progress Notes (Signed)
SICKLE CELL SERVICE PROGRESS NOTE  [name] WNU:[id number] DOB: 12-06-1980 DOA: 04/30/2016 PCP: [name], MD  Assessment/Plan: Principal Problem:   Sickle cell pain crisis ([place]) Active Problems:   Pulmonary HTN   Chronic respiratory failure with hypoxia (HCC)   Essential hypertension   Chronic pain syndrome  1. Hb SS With Crisis: Schedule oral Dilaudid every four hours as this is how patient takes ti on a chronic basis. Start Toradol 15 mg IV and continue PCA at bolus dose of 0.7 mg. Encourage ICS use and ambulation. 2. 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 Pt was recently discahrged home on 3 L/min of Oxygen he is now requiring 4 L/min . Will continue to wean for saturations >87%. He was uanble to keep his appointment due to difficulty with transportation. Will try to set up appointment with local Pulmonologist ina co-management model with Pulmonologist at [facility].  3. Anemia of Chronic Disease: Hb at baseline of 7.1 g/dL. No indication for  transfusion. Continue Hydrea in setting of robust reticulocytosis.  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  5. Leukocytosis: At baseline. Likely related to crisis. No clinical evidence of infection.  6. Chronic pain: Continue MS Contin 7. Chronic Anticoagulation: Pt on Xarelto for recurrent PE's. Continue Xarelto.  8. Benign HTN: BP will controlled with current medications. 9. Constipation: Pt has  MiraLax ordered. Encouraged patient to use.    Code Status: Full Code Family Communication: N/A Disposition Plan: Not yet ready for Discharge  [place].  Pager [phone]. If 7PM-7AM, please contact night-coverage.  05/02/2016, 1:06 PM  LOS: 2 days   Interim History: Pt awake and alert. He reports that he has been using his Oxygen ATC. He was transported to the hospital by Ambulance and thus received Oxygen in-transit. He rates his pain at 8/10 and  localized to sternum and ribs. He has used 51.6 mg with 69/65:demands/deliveries of Dilaudid in he last 24 hours. Last BM 3 days ago.  Review of Records:  2-D ECHO ([facility]-01/06/16): Summary Technically adequate study Dilated right atrium and right ventricle with abnormal right ventricular systolic function consistent with acute strain on the right ventricle such as pulmonary embolus Other chambers appear structurally normal Normal LV systolic function , flattened intraventricular septum consistent with pressure overload on the right side, normal LV systolic ejection fraction calculated at 68% Valves appear structurally normal mild mitral regurgitation Moderate to severe tricuspid regurgitation with moderate pulmonary hypertension right ventricular systolic pressure estimated of 60 mmHg No pericardial effusion The findings are consistent with acute/ recent pulmonary embolus Signature   Consultants:  None  Procedures:  None  Antibiotics:  None  Objective: Vitals:   05/02/16 0400 05/02/16 0438 05/02/16 0800 05/02/16 1138  BP: 128/81     Pulse: 99     Resp: 20 20 16  (!) 24  Temp: 98.4 F (36.9 C)     TempSrc: Oral     SpO2: 95% 92% 94% 90%  Weight:      Height:       Weight change:   Intake/Output Summary (Last 24 hours) at 05/02/16 1306 Last data filed at 05/02/16 1233  Gross per 24 hour  Intake           2903.6 ml  Output             1525 ml  Net           1378.6 ml  General: Alert, awake, oriented x3, in no apparent distress. HEENT: [place]/AT PEERL, EOMI, anicteric.  Heart: Regular rate and rhythm, II/VI systolic ejection murmurs at base, no rubs or gallops, PMI non-displaced, no heaves or thrills on palpation.  Lungs: Clear to auscultation except at b/l bases where there are mild crackles. No wheezing or rhonchi noted. No increased vocal fremitus resonant to percussion  Abdomen: Soft, nontender, nondistended, positive bowel sounds, no masses no  hepatosplenomegaly noted.  Neuro: No focal neurological deficits noted cranial nerves II through XII grossly intact. . Strength at functional baseline in bilateral upper and lower extremities. Musculoskeletal: No warmth swelling or erythema around joints, no spinal tenderness noted. Psychiatric: Patient alert and oriented x3, good insight and cognition, good recent to remote recall.    Data Reviewed: Basic Metabolic Panel:  Recent Labs Lab 04/26/16 0437 04/29/16 0855 04/29/16 1940 05/02/16 1215  NA 139 137 135 135  K 3.8 3.3* 3.5 3.6  CL 106 109 107 107  CO2 28 22 21* 22  GLUCOSE 105* 100* 100* 122*  BUN 21* 9 12 13   CREATININE 1.02 0.77 0.97 0.71  CALCIUM 9.0 8.8* 9.2 8.5*   Liver Function Tests:  Recent Labs Lab 04/29/16 0855  AST 36  ALT 37  ALKPHOS 106  BILITOT 5.5*  PROT 7.7  ALBUMIN 3.9   No results for input(s): LIPASE, AMYLASE in the last 168 hours. No results for input(s): AMMONIA in the last 168 hours. CBC:  Recent Labs Lab 04/26/16 0437 04/29/16 0855 04/29/16 1940 05/02/16 1215  WBC 16.7* 14.7* 18.5* 15.7*  NEUTROABS 11.0* 10.6*  --  PENDING  HGB 7.5* 8.1* 8.1* 7.1*  HCT 22.6* 23.1* 22.9* 20.4*  MCV 89.7 88.8 88.1 89.1  PLT 480* 466* 465* 454*   Cardiac Enzymes: No results for input(s): CKTOTAL, CKMB, CKMBINDEX, TROPONINI in the last 168 hours. BNP (last 3 results)  Recent Labs  04/13/16 1015 04/14/16 0500 04/21/16 1139  BNP 364.2* 315.7* 321.8*    ProBNP (last 3 results) No results for input(s): PROBNP in the last 8760 hours.  CBG: No results for input(s): GLUCAP in the last 168 hours.  No results found for this or any previous visit (from the past 240 hour(s)).   Studies: Dg Chest 2 View  Result Date: 04/29/2016 CLINICAL DATA:  Acute onset of central chest throbbing and rib pain. Initial encounter. EXAM: CHEST  2 VIEW COMPARISON:  Chest radiograph performed 04/22/2016 FINDINGS: The lungs are well-aerated. Mild vascular  congestion is noted. There is no evidence of focal opacification, pleural effusion or pneumothorax. The heart is enlarged. A left-sided chest port noted ending about the distal SVC. No acute osseous abnormalities are seen. Clips are noted within the right upper quadrant, reflecting prior cholecystectomy. IMPRESSION: Mild vascular congestion and cardiomegaly. Lungs remain grossly clear. Electronically Signed   By: Garald Balding M.D.   On: 04/29/2016 19:53   Dg Chest 2 View  Result Date: 04/22/2016 CLINICAL DATA:  Sickle cell pain crisis, shortness of breath today. History of hypertension. EXAM: CHEST  2 VIEW COMPARISON:  Chest x-rays dated 04/17/2016 and 02/11/2016. FINDINGS: Cardiomegaly is stable. There is central pulmonary vascular congestion. Prominent interstitial markings again noted bilaterally suggesting associated interstitial edema. No evidence of overt alveolar pulmonary edema. No confluent opacity to suggest a developing pneumonia. No pleural effusion or pneumothorax seen. Left chest wall Port-A-Cath is stable in position with tip at the level of the lower SVC. No acute or suspicious osseous finding. IMPRESSION: Cardiomegaly with central pulmonary  vascular congestion and bilateral interstitial edema suggesting volume overload/CHF. No evidence of pneumonia. Electronically Signed   By: Franki Cabot M.D.   On: 04/22/2016 14:14   Dg Chest 2 View  Result Date: 04/13/2016 CLINICAL DATA:  History of sickle cell disease. Patient admitted 04/10/2016 with chest pain. Hypoxia and pulmonary edema. EXAM: CHEST  2 VIEW COMPARISON:  PA and lateral chest 04/09/2016 and 01/31/2016. Single-view of the chest 04/11/2016. FINDINGS: Marked cardiomegaly is again seen. There is mild interstitial edema which appears unchanged since yesterday's examination. No pneumothorax or pleural effusion. No consolidative process. Port-A-Cath is in place. Avascular necrosis left humeral head is noted. IMPRESSION: No change in mild  interstitial edema in patient with marked cardiomegaly. Electronically Signed   By: Inge Rise M.D.   On: 04/13/2016 10:54   Dg Chest 2 View  Result Date: 04/10/2016 CLINICAL DATA:  Bilateral chest wall pain. Sickle cell disease. Pain began yesterday. EXAM: CHEST  2 VIEW COMPARISON:  03/27/2016 FINDINGS: Left subclavian port extends to the low SVC. Unchanged cardiomegaly. No airspace consolidation. No pleural effusions. Pulmonary vasculature is normal. Hilar and mediastinal contours are unremarkable and unchanged. IMPRESSION: Unchanged cardiomegaly.  No consolidation or effusion. Electronically Signed   By: Andreas Newport M.D.   On: 04/10/2016 00:50   Dg Chest Port 1 View  Result Date: 04/17/2016 CLINICAL DATA:  Lethargy.  Shortness of breath. EXAM: PORTABLE CHEST 1 VIEW COMPARISON:  None. FINDINGS: Stable enlarged cardiac silhouette. Increased density at the left lateral lung base. Interval mild patchy and linear opacity at the right lung base. Left subclavian porta catheter tip in the right atrium. No acute bony abnormality. 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 IMPRESSION: 1. Interval atelectasis or pneumonia at the left lateral lung base. 2. Interval mild atelectasis and possible pneumonia at the right lung base. 3. Stable cardiomegaly. 4. Left subclavian porta catheter tip in the right atrium. This could be retracted 4.5 cm to place it in the superior vena cava. Electronically Signed   By: Claudie Revering M.D.   On: 04/17/2016 09:06   Dg Chest Port 1 View  Result Date: 04/11/2016 CLINICAL DATA:  Initial evaluation for sickle cell pain crisis. Worsened shortness of breath. EXAM: PORTABLE CHEST 1 VIEW COMPARISON:  Prior radiograph from 04/09/2016. FINDINGS: Left-sided Port-A-Cath in place, stable. Severe cardiomegaly not significantly changed. Mediastinal silhouette within normal limits. Lungs mildly hypoinflated. There is slightly worsened pulmonary vascular congestion  as compared to previous. Slightly worsened right infrahilar opacity, which may reflect developing infiltrate and/or atelectasis. More linear opacities at the bilateral lung bases most consistent with atelectasis. No pleural effusion. No pneumothorax. No acute osseous abnormality. IMPRESSION: 1. Severe cardiomegaly with slightly worsened diffuse pulmonary vascular congestion as compared to 04/09/16, which may reflect developing pulmonary edema. 2. Slightly patchy right infrahilar opacity, which may reflect congestion and/or developing infiltrate. 3. Mildly increased superimposed bibasilar atelectasis. Electronically Signed   By: Jeannine Boga M.D.   On: 04/11/2016 06:10    Scheduled Meds: . ambrisentan  5 mg Oral Daily  . aspirin  81 mg Oral Daily  . carvedilol  3.125 mg Oral BID WC  . folic acid  2 mg Oral Daily  . gabapentin  300 mg Oral TID  . HYDROmorphone   Intravenous Q4H  . HYDROmorphone  4 mg Oral Q4H  . hydroxyurea  1,000 mg Oral Q48H  . hydroxyurea  500 mg Oral Q48H  . L-glutamine  15 g Oral BID  . morphine  30 mg Oral Q12H  . rivaroxaban  20 mg Oral Q supper  . senna-docusate  1 tablet Oral BID   Continuous Infusions: . dextrose 5 % and 0.45% NaCl 10 mL/hr at 05/02/16 1134    Principal Problem:   Sickle cell pain crisis (HCC) Active Problems:   Pulmonary HTN   Chronic respiratory failure with hypoxia (HCC)   Essential hypertension   Chronic pain syndrome   In excess of 35 minutes spent during this visit. Greater than 50% involved face to face contact with the patient for assessment, counseling and coordination of care.

## 2016-05-03 ENCOUNTER — Ambulatory Visit: Payer: Self-pay | Admitting: Family Medicine

## 2016-05-03 DIAGNOSIS — J9621 Acute and chronic respiratory failure with hypoxia: Secondary | ICD-10-CM

## 2016-05-03 DIAGNOSIS — I2721 Secondary pulmonary arterial hypertension: Secondary | ICD-10-CM

## 2016-05-03 LAB — BLOOD GAS, ARTERIAL
ACID-BASE DEFICIT: 2.6 mmol/L — AB (ref 0.0–2.0)
BICARBONATE: 22.9 mmol/L (ref 20.0–28.0)
DRAWN BY: 276051
O2 CONTENT: 4.5 L/min
O2 Saturation: 44.8 %
PATIENT TEMPERATURE: 98.6
pCO2 arterial: 47.7 mmHg (ref 32.0–48.0)
pH, Arterial: 7.303 — ABNORMAL LOW (ref 7.350–7.450)
pO2, Arterial: 32.2 mmHg — CL (ref 83.0–108.0)

## 2016-05-03 MED ORDER — HYDROMORPHONE 1 MG/ML IV SOLN
INTRAVENOUS | Status: DC
  Administered 2016-05-03: 12.2 mg via INTRAVENOUS
  Administered 2016-05-03: 9 mg via INTRAVENOUS
  Administered 2016-05-03: 1 mg via INTRAVENOUS
  Administered 2016-05-04: 2.4 mg via INTRAVENOUS
  Administered 2016-05-04: 11.8 mg via INTRAVENOUS
  Administered 2016-05-04: 9 mg via INTRAVENOUS
  Administered 2016-05-04: 11:00:00 via INTRAVENOUS
  Administered 2016-05-04: 6.6 mg via INTRAVENOUS
  Filled 2016-05-03 (×2): qty 25

## 2016-05-03 MED ORDER — HYDROMORPHONE HCL 1 MG/ML IJ SOLN
1.0000 mg | Freq: Once | INTRAMUSCULAR | Status: AC
  Administered 2016-05-03: 1 mg via INTRAVENOUS
  Filled 2016-05-03: qty 1

## 2016-05-03 MED ORDER — KETOROLAC TROMETHAMINE 30 MG/ML IJ SOLN
30.0000 mg | Freq: Four times a day (QID) | INTRAMUSCULAR | Status: AC
Start: 2016-05-03 — End: 2016-05-07
  Administered 2016-05-03 – 2016-05-07 (×14): 30 mg via INTRAVENOUS
  Filled 2016-05-03 (×16): qty 1

## 2016-05-03 NOTE — Progress Notes (Signed)
Patient in bed resting peacefully.  No complaints of pain noted.

## 2016-05-03 NOTE — Progress Notes (Signed)
SICKLE CELL SERVICE PROGRESS NOTE  [name] MCN:[id number] DOB: Sep 15, 1980 DOA: 04/30/2016 PCP: [name], MD  Assessment/Plan: Principal Problem:   Sickle cell pain crisis ([place]) Active Problems:   Pulmonary HTN   Chronic respiratory failure with hypoxia (HCC)   Essential hypertension   Chronic pain syndrome  1. Hb SS With Crisis: Continue scheduled oral Dilaudid every four hours in addition to MS Contin. Continue PCA at bolus dose of 0.6 mg. Encourage ICS use and ambulation. 2. 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 Pt was recently discahrged home on 3 L/min of Oxygen he is now requiring 5-6 L/min. Will obtain CXR and ABG today to evaluate. Encourage use of incentive spirometer and increased ambulation. Will speak with PMD about setting up appointment with local Pulmonologist in a co-management model with Pulmonologist at [facility].  3. Anemia of Chronic Disease: Hb at baseline of 7.1 g/dL. No indication for  transfusion. Continue Hydrea in setting of robust reticulocytosis.  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  5. Leukocytosis: At baseline. Likely related to crisis. No clinical evidence of infection.  6. Chronic pain: Continue MS Contin 7. Chronic Anticoagulation: Pt on Xarelto for recurrent PE's. Continue Xarelto.  8. Benign HTN: BP will controlled with current medications. 9. Constipation: Pt has  MiraLax ordered. Encouraged patient to use.    Code Status: Full Code Family Communication: N/A Disposition Plan: Not yet ready for Discharge  [place].  Pager [phone]. If 7PM-7AM, please contact night-coverage.  05/03/2016, 11:46 AM  LOS: 3 days   Interim History: Pt states that his upper body feels sore "... Like I was run over by a truck" and rates the intensity as 8/10. He has used 50.6 mg with 77/73:demands/deliveries in the last 24 hours.    Review of Records:  2-D ECHO ([facility]-01/06/16): Summary Technically adequate study Dilated right atrium and right ventricle with abnormal right ventricular systolic function consistent with acute strain on the right ventricle such as pulmonary embolus Other chambers appear structurally normal Normal LV systolic function , flattened intraventricular septum consistent with pressure overload on the right side, normal LV systolic ejection fraction calculated at 68% Valves appear structurally normal mild mitral regurgitation Moderate to severe tricuspid regurgitation with moderate pulmonary hypertension right ventricular systolic pressure estimated of 60 mmHg No pericardial effusion The findings are consistent with acute/ recent pulmonary embolus Signature   Consultants:  None  Procedures:  None  Antibiotics:  None  Objective: Vitals:   05/03/16 0124 05/03/16 0420 05/03/16 0637 05/03/16 1018  BP: 126/81  113/62 (!) 102/59  Pulse: 89  85 81  Resp: 18 20 16 16   Temp: 98.4 F (36.9 C)  98.4 F (36.9 C) 98.6 F (37 C)  TempSrc: Oral  Oral Oral  SpO2: 94% 92% 91% 91%  Weight:      Height:       Weight change:   Intake/Output Summary (Last 24 hours) at 05/03/16 1146 Last data filed at 05/03/16 1045  Gross per 24 hour  Intake          1832.67 ml  Output             2040 ml  Net          -207.33 ml    General: Alert, awake, oriented x3, in moderate distress due to pain and pt demonstrates mild increased WOB. Marland Kitchen HEENT: [place]/AT PEERL, EOMI, anicteric.  Heart: Regular rate and rhythm,  II/VI systolic ejection murmurs at base, no rubs or gallops, PMI non-displaced, no heaves or thrills on palpation.  Lungs: Clear to auscultation except at b/l bases where there are mild crackles. No wheezing or rhonchi noted. No increased vocal fremitus resonant to percussion  Abdomen: Soft, nontender, nondistended, positive bowel sounds, no masses no hepatosplenomegaly noted.  Neuro: No focal neurological deficits noted  cranial nerves II through XII grossly intact. . Strength at functional baseline in bilateral upper and lower extremities. Musculoskeletal: No warmth swelling or erythema around joints, no spinal tenderness noted. Psychiatric: Patient alert and oriented x3, good insight and cognition, good recent to remote recall.    Data Reviewed: Basic Metabolic Panel:  Recent Labs Lab 04/29/16 0855 04/29/16 1940 05/02/16 1215  NA 137 135 135  K 3.3* 3.5 3.6  CL 109 107 107  CO2 22 21* 22  GLUCOSE 100* 100* 122*  BUN 9 12 13   CREATININE 0.77 0.97 0.71  CALCIUM 8.8* 9.2 8.5*   Liver Function Tests:  Recent Labs Lab 04/29/16 0855  AST 36  ALT 37  ALKPHOS 106  BILITOT 5.5*  PROT 7.7  ALBUMIN 3.9   No results for input(s): LIPASE, AMYLASE in the last 168 hours. No results for input(s): AMMONIA in the last 168 hours. CBC:  Recent Labs Lab 04/29/16 0855 04/29/16 1940 05/02/16 1215  WBC 14.7* 18.5* 15.7*  NEUTROABS 10.6*  --  8.9*  HGB 8.1* 8.1* 7.1*  HCT 23.1* 22.9* 20.4*  MCV 88.8 88.1 89.1  PLT 466* 465* 454*   Cardiac Enzymes: No results for input(s): CKTOTAL, CKMB, CKMBINDEX, TROPONINI in the last 168 hours. BNP (last 3 results)  Recent Labs  04/13/16 1015 04/14/16 0500 04/21/16 1139  BNP 364.2* 315.7* 321.8*    ProBNP (last 3 results) No results for input(s): PROBNP in the last 8760 hours.  CBG: No results for input(s): GLUCAP in the last 168 hours.  No results found for this or any previous visit (from the past 240 hour(s)).   Studies: Dg Chest 2 View  Result Date: 04/29/2016 CLINICAL DATA:  Acute onset of central chest throbbing and rib pain. Initial encounter. EXAM: CHEST  2 VIEW COMPARISON:  Chest radiograph performed 04/22/2016 FINDINGS: The lungs are well-aerated. Mild vascular congestion is noted. There is no evidence of focal opacification, pleural effusion or pneumothorax. The heart is enlarged. A left-sided chest port noted ending about the distal  SVC. No acute osseous abnormalities are seen. Clips are noted within the right upper quadrant, reflecting prior cholecystectomy. IMPRESSION: Mild vascular congestion and cardiomegaly. Lungs remain grossly clear. Electronically Signed   By: Garald Balding M.D.   On: 04/29/2016 19:53   Dg Chest 2 View  Result Date: 04/22/2016 CLINICAL DATA:  Sickle cell pain crisis, shortness of breath today. History of hypertension. EXAM: CHEST  2 VIEW COMPARISON:  Chest x-rays dated 04/17/2016 and 02/11/2016. FINDINGS: Cardiomegaly is stable. There is central pulmonary vascular congestion. Prominent interstitial markings again noted bilaterally suggesting associated interstitial edema. No evidence of overt alveolar pulmonary edema. No confluent opacity to suggest a developing pneumonia. No pleural effusion or pneumothorax seen. Left chest wall Port-A-Cath is stable in position with tip at the level of the lower SVC. No acute or suspicious osseous finding. IMPRESSION: Cardiomegaly with central pulmonary vascular congestion and bilateral interstitial edema suggesting volume overload/CHF. No evidence of pneumonia. Electronically Signed   By: Franki Cabot M.D.   On: 04/22/2016 14:14   Dg Chest 2 View  Result Date: 04/13/2016 CLINICAL  DATA:  History of sickle cell disease. Patient admitted 04/10/2016 with chest pain. Hypoxia and pulmonary edema. EXAM: CHEST  2 VIEW COMPARISON:  PA and lateral chest 04/09/2016 and 01/31/2016. Single-view of the chest 04/11/2016. FINDINGS: Marked cardiomegaly is again seen. There is mild interstitial edema which appears unchanged since yesterday's examination. No pneumothorax or pleural effusion. No consolidative process. Port-A-Cath is in place. Avascular necrosis left humeral head is noted. IMPRESSION: No change in mild interstitial edema in patient with marked cardiomegaly. Electronically Signed   By: Inge Rise M.D.   On: 04/13/2016 10:54   Dg Chest 2 View  Result Date:  04/10/2016 CLINICAL DATA:  Bilateral chest wall pain. Sickle cell disease. Pain began yesterday. EXAM: CHEST  2 VIEW COMPARISON:  03/27/2016 FINDINGS: Left subclavian port extends to the low SVC. Unchanged cardiomegaly. No airspace consolidation. No pleural effusions. Pulmonary vasculature is normal. Hilar and mediastinal contours are unremarkable and unchanged. IMPRESSION: Unchanged cardiomegaly.  No consolidation or effusion. Electronically Signed   By: Andreas Newport M.D.   On: 04/10/2016 00:50   Dg Chest Port 1 View  Result Date: 04/17/2016 CLINICAL DATA:  Lethargy.  Shortness of breath. EXAM: PORTABLE CHEST 1 VIEW COMPARISON:  None. FINDINGS: Stable enlarged cardiac silhouette. Increased density at the left lateral lung base. Interval mild patchy and linear opacity at the right lung base. Left subclavian porta catheter tip in the right atrium. No acute bony abnormality. 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 IMPRESSION: 1. Interval atelectasis or pneumonia at the left lateral lung base. 2. Interval mild atelectasis and possible pneumonia at the right lung base. 3. Stable cardiomegaly. 4. Left subclavian porta catheter tip in the right atrium. This could be retracted 4.5 cm to place it in the superior vena cava. Electronically Signed   By: Claudie Revering M.D.   On: 04/17/2016 09:06   Dg Chest Port 1 View  Result Date: 04/11/2016 CLINICAL DATA:  Initial evaluation for sickle cell pain crisis. Worsened shortness of breath. EXAM: PORTABLE CHEST 1 VIEW COMPARISON:  Prior radiograph from 04/09/2016. FINDINGS: Left-sided Port-A-Cath in place, stable. Severe cardiomegaly not significantly changed. Mediastinal silhouette within normal limits. Lungs mildly hypoinflated. There is slightly worsened pulmonary vascular congestion as compared to previous. Slightly worsened right infrahilar opacity, which may reflect developing infiltrate and/or atelectasis. More linear opacities at the  bilateral lung bases most consistent with atelectasis. No pleural effusion. No pneumothorax. No acute osseous abnormality. IMPRESSION: 1. Severe cardiomegaly with slightly worsened diffuse pulmonary vascular congestion as compared to 04/09/16, which may reflect developing pulmonary edema. 2. Slightly patchy right infrahilar opacity, which may reflect congestion and/or developing infiltrate. 3. Mildly increased superimposed bibasilar atelectasis. Electronically Signed   By: Jeannine Boga M.D.   On: 04/11/2016 06:10    Scheduled Meds: . ambrisentan  5 mg Oral Daily  . aspirin  81 mg Oral Daily  . carvedilol  3.125 mg Oral BID WC  . folic acid  2 mg Oral Daily  . gabapentin  300 mg Oral TID  . HYDROmorphone   Intravenous Q4H  . HYDROmorphone  4 mg Oral Q4H  . hydroxyurea  1,000 mg Oral Q48H  . hydroxyurea  500 mg Oral Q48H  . ketorolac  15 mg Intravenous Q6H  . L-glutamine  15 g Oral BID  . morphine  30 mg Oral Q12H  . rivaroxaban  20 mg Oral Q supper  . senna-docusate  1 tablet Oral BID   Continuous Infusions: . dextrose 5 % and  0.45% NaCl 10 mL/hr at 05/02/16 1134    Principal Problem:   Sickle cell pain crisis (HCC) Active Problems:   Pulmonary HTN   Chronic respiratory failure with hypoxia (HCC)   Essential hypertension   Chronic pain syndrome   In excess of 35 minutes spent during this visit. Greater than 50% involved face to face contact with the patient for assessment, counseling and coordination of care.

## 2016-05-03 NOTE — Progress Notes (Signed)
ABG drawn per order; values showing 7.303/47.7/32.2/22.9/44.8%. Second RT attempted to draw another sample from patient, but patient refused 2nd RT to attempt to draw arterial blood. Dr. Zigmund Daniel paged and aware of results.

## 2016-05-04 ENCOUNTER — Telehealth: Payer: Self-pay

## 2016-05-04 DIAGNOSIS — I272 Pulmonary hypertension, unspecified: Secondary | ICD-10-CM

## 2016-05-04 LAB — RETICULOCYTES
RBC.: 2.16 MIL/uL — AB (ref 4.22–5.81)
RETIC COUNT ABSOLUTE: 315.4 10*3/uL — AB (ref 19.0–186.0)
Retic Ct Pct: 14.6 % — ABNORMAL HIGH (ref 0.4–3.1)

## 2016-05-04 LAB — BASIC METABOLIC PANEL
Anion gap: 4 — ABNORMAL LOW (ref 5–15)
BUN: 19 mg/dL (ref 6–20)
CALCIUM: 8.3 mg/dL — AB (ref 8.9–10.3)
CO2: 23 mmol/L (ref 22–32)
Chloride: 109 mmol/L (ref 101–111)
Creatinine, Ser: 0.88 mg/dL (ref 0.61–1.24)
GFR calc Af Amer: >60 mL/min (ref >60)
GLUCOSE: 124 mg/dL — AB (ref 65–99)
Potassium: 4.2 mmol/L (ref 3.5–5.1)
SODIUM: 136 mmol/L (ref 135–145)

## 2016-05-04 LAB — CBC WITH DIFFERENTIAL/PLATELET
BASOS PCT: 1 %
Basophils Absolute: 0.1 10*3/uL (ref 0.0–0.1)
EOS ABS: 1 10*3/uL — AB (ref 0.0–0.7)
EOS PCT: 7 %
HEMATOCRIT: 19 % — AB (ref 39.0–52.0)
Hemoglobin: 6.7 g/dL — CL (ref 13.0–17.0)
LYMPHS PCT: 12 %
Lymphs Abs: 1.7 10*3/uL (ref 0.7–4.0)
MCH: 31 pg (ref 26.0–34.0)
MCHC: 35.3 g/dL (ref 30.0–36.0)
MCV: 88 fL (ref 78.0–100.0)
MONO ABS: 2.9 10*3/uL — AB (ref 0.1–1.0)
MONOS PCT: 20 %
NRBC: 1 /100{WBCs} — AB
Neutro Abs: 8.6 10*3/uL — ABNORMAL HIGH (ref 1.7–7.7)
Neutrophils Relative %: 60 %
PLATELETS: 479 10*3/uL — AB (ref 150–400)
RBC: 2.16 MIL/uL — ABNORMAL LOW (ref 4.22–5.81)
RDW: 21.1 % — AB (ref 11.5–15.5)
WBC: 14.3 10*3/uL — ABNORMAL HIGH (ref 4.0–10.5)

## 2016-05-04 LAB — BLOOD GAS, ARTERIAL
ACID-BASE DEFICIT: 3 mmol/L — AB (ref 0.0–2.0)
BICARBONATE: 22 mmol/L (ref 20.0–28.0)
O2 CONTENT: 5 L/min
O2 Saturation: 86.4 %
PCO2 ART: 42.3 mmHg (ref 32.0–48.0)
PH ART: 7.336 — AB (ref 7.350–7.450)
PO2 ART: 67.9 mmHg — AB (ref 83.0–108.0)
Patient temperature: 98.6

## 2016-05-04 LAB — MAGNESIUM: Magnesium: 2 mg/dL (ref 1.7–2.4)

## 2016-05-04 LAB — TROPONIN I: Troponin I: 0.05 ng/mL (ref <0.03)

## 2016-05-04 MED ORDER — TORSEMIDE 20 MG PO TABS
20.0000 mg | ORAL_TABLET | Freq: Every day | ORAL | Status: DC
  Administered 2016-05-04 – 2016-05-08 (×5): 20 mg via ORAL
  Filled 2016-05-04 (×5): qty 1

## 2016-05-04 MED ORDER — HYDROMORPHONE 1 MG/ML IV SOLN
INTRAVENOUS | Status: DC
  Administered 2016-05-04: 15 mg via INTRAVENOUS
  Administered 2016-05-04: 1 mg via INTRAVENOUS
  Administered 2016-05-05: 3.6 mg via INTRAVENOUS
  Administered 2016-05-05: 0.6 mg via INTRAVENOUS
  Administered 2016-05-05: 15:00:00 via INTRAVENOUS
  Administered 2016-05-05: 1.2 mg via INTRAVENOUS
  Administered 2016-05-05: 13.8 mg via INTRAVENOUS
  Administered 2016-05-05: 10.2 mg via INTRAVENOUS
  Filled 2016-05-04 (×2): qty 25

## 2016-05-04 NOTE — Progress Notes (Signed)
CRITICAL VALUE ALERT  Critical value received:  Hgb 6.7 and Troponin 0.05  Date of notification:  05/04/2016   Time of notification:  4:06 PM   Critical value read back:Yes.    Nurse who received alert:  Kaylyn Layer   MD notified (1st page):  Dr. Stann Mainland  Time of first page:  4:07 PM  MD notified (2nd page):  Time of second page:  Responding MD:  Dr. Stann Mainland  Time MD responded:  4:08 PM  Continue to monitor patient. No new orders at this time.   [facility] 05/04/2016

## 2016-05-04 NOTE — Progress Notes (Signed)
Patient ambulated about 116ft in the hallway. He was on 5L oxygen and desat to 75%. O2 sat came up to 92% with standing rest period. Increased O2 to 8L/min and patient continued to ambulate, but still desat to 78%. Patient did have some dyspnea on exertion, especially after he bathed off in the bathroom after ambulating.

## 2016-05-04 NOTE — Care Management Important Message (Signed)
Important Message  Patient Details  Name: SALIOU BARNIER MRN: [id number] Date of Birth: 12/18/1980   Medicare Important Message Given:  Yes    Kerin Salen 05/04/2016, 10:18 AMImportant Message  Patient Details  Name: JOCOB DAMBACH MRN: [id number] Date of Birth: [birth year]   Medicare Important Message Given:  Yes    Kerin Salen 05/04/2016, 10:17 AM

## 2016-05-04 NOTE — Progress Notes (Signed)
SICKLE CELL SERVICE PROGRESS NOTE  [name] XTK:[id number] DOB: 08-18-1980 DOA: 04/30/2016 PCP: [name], MD  Assessment/Plan: Principal Problem:   Sickle cell pain crisis ([place]) Active Problems:   Pulmonary HTN   Chronic respiratory failure with hypoxia (HCC)   Essential hypertension   Chronic pain syndrome  1. Acute on Chronic Respiratory Failure: ABG results noted. Will start on Torsemide and obtain daily weights. Continue Oxygen supplementation. Obtain troponin levels. Chronic respiratory failure due to PAH-continue Letairis. Pt was recently discahrged home on 3 L/min of Oxygen he is now requiring 5-6 L/min. Will obtain CXR and ABG today to evaluate. Encourage use of incentive spirometer and increased ambulation. Will speak with PMD about setting up appointment with local Pulmonologist in a co-management model with Pulmonologist at [facility].  2. Prolonged QTc: No medications known to induce QTc prolongation. Check Potassium and Magnesium levels.  3. Hb SS With Crisis: Continue scheduled oral Dilaudid every four hours in addition to MS Contin. Increase frequency of PCA bolus to every 8 minutes. Encourage ICS use and ambulation. 4. Anemia of Chronic Disease: labs pending from today. 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  6. Leukocytosis: Labs pending from today.  7. Chronic pain: Continue MS Contin 8. Chronic Anticoagulation: Pt on Xarelto for recurrent PE's. Continue Xarelto.  9. Benign HTN: BP will controlled with current medications. 10. Constipation: Pt has  MiraLax ordered. Encouraged patient to use.    Code Status: Full Code Family Communication: N/A Disposition Plan: Not yet ready for Discharge  [place].  Pager [phone]. If 7PM-7AM, please contact night-coverage.  05/04/2016, 2:45 PM  LOS: 4 days   Interim History: Pt continues to rate pain in the left chest wall as 8/10. He localizes it to the ribs. He appears better than he did yesterday  and is currently eating lunch. He reports that he has not been taking his Torsemide at home. ABG noted.   Review of Records:  2-D ECHO ([facility]-01/06/16): Summary Technically adequate study Dilated right atrium and right ventricle with abnormal right ventricular systolic function consistent with acute strain on the right ventricle such as pulmonary embolus Other chambers appear structurally normal Normal LV systolic function , flattened intraventricular septum consistent with pressure overload on the right side, normal LV systolic ejection fraction calculated at 68% Valves appear structurally normal mild mitral regurgitation Moderate to severe tricuspid regurgitation with moderate pulmonary hypertension right ventricular systolic pressure estimated of 60 mmHg No pericardial effusion The findings are consistent with acute/ recent pulmonary embolus Signature   Consultants:  None  Procedures:  None  Antibiotics:  None  Objective: Vitals:   05/04/16 0517 05/04/16 0809 05/04/16 0826 05/04/16 1148  BP:   118/77   Pulse:   85   Resp:  15  19  Temp:      TempSrc:      SpO2: 91% (!) 85% (!) 88% 90%  Weight:      Height:       Weight change:   Intake/Output Summary (Last 24 hours) at 05/04/16 1445 Last data filed at 05/04/16 1043  Gross per 24 hour  Intake              934 ml  Output              525 ml  Net              409 ml    General: Alert, awake, oriented x3, in mild distress due to  pain. WOB normal today.  HEENT: [place]/AT PEERL, EOMI, anicteric.  Heart: Regular rate and rhythm, II/VI systolic ejection murmurs at base, no rubs or gallops, PMI non-displaced, no heaves or thrills on palpation.  Lungs: Clear to auscultation except at b/l bases where there are mild crackles. No wheezing or rhonchi noted. No increased vocal fremitus resonant to percussion  Abdomen: Soft, nontender, nondistended, positive bowel sounds, no masses no hepatosplenomegaly noted.   Neuro: No focal neurological deficits noted cranial nerves II through XII grossly intact. . Strength at functional baseline in bilateral upper and lower extremities. Musculoskeletal: No warmth swelling or erythema around joints, no spinal tenderness noted. Psychiatric: Patient alert and oriented x3, good insight and cognition, good recent to remote recall.    Data Reviewed: Basic Metabolic Panel:  Recent Labs Lab 04/29/16 0855 04/29/16 1940 05/02/16 1215  NA 137 135 135  K 3.3* 3.5 3.6  CL 109 107 107  CO2 22 21* 22  GLUCOSE 100* 100* 122*  BUN 9 12 13   CREATININE 0.77 0.97 0.71  CALCIUM 8.8* 9.2 8.5*   Liver Function Tests:  Recent Labs Lab 04/29/16 0855  AST 36  ALT 37  ALKPHOS 106  BILITOT 5.5*  PROT 7.7  ALBUMIN 3.9   No results for input(s): LIPASE, AMYLASE in the last 168 hours. No results for input(s): AMMONIA in the last 168 hours. CBC:  Recent Labs Lab 04/29/16 0855 04/29/16 1940 05/02/16 1215  WBC 14.7* 18.5* 15.7*  NEUTROABS 10.6*  --  8.9*  HGB 8.1* 8.1* 7.1*  HCT 23.1* 22.9* 20.4*  MCV 88.8 88.1 89.1  PLT 466* 465* 454*   Cardiac Enzymes: No results for input(s): CKTOTAL, CKMB, CKMBINDEX, TROPONINI in the last 168 hours. BNP (last 3 results)  Recent Labs  04/13/16 1015 04/14/16 0500 04/21/16 1139  BNP 364.2* 315.7* 321.8*    ProBNP (last 3 results) No results for input(s): PROBNP in the last 8760 hours.  CBG: No results for input(s): GLUCAP in the last 168 hours.  No results found for this or any previous visit (from the past 240 hour(s)).   Studies: Dg Chest 2 View  Result Date: 04/29/2016 CLINICAL DATA:  Acute onset of central chest throbbing and rib pain. Initial encounter. EXAM: CHEST  2 VIEW COMPARISON:  Chest radiograph performed 04/22/2016 FINDINGS: The lungs are well-aerated. Mild vascular congestion is noted. There is no evidence of focal opacification, pleural effusion or pneumothorax. The heart is enlarged. A  left-sided chest port noted ending about the distal SVC. No acute osseous abnormalities are seen. Clips are noted within the right upper quadrant, reflecting prior cholecystectomy. IMPRESSION: Mild vascular congestion and cardiomegaly. Lungs remain grossly clear. Electronically Signed   By: Garald Balding M.D.   On: 04/29/2016 19:53   Dg Chest 2 View  Result Date: 04/22/2016 CLINICAL DATA:  Sickle cell pain crisis, shortness of breath today. History of hypertension. EXAM: CHEST  2 VIEW COMPARISON:  Chest x-rays dated 04/17/2016 and 02/11/2016. FINDINGS: Cardiomegaly is stable. There is central pulmonary vascular congestion. Prominent interstitial markings again noted bilaterally suggesting associated interstitial edema. No evidence of overt alveolar pulmonary edema. No confluent opacity to suggest a developing pneumonia. No pleural effusion or pneumothorax seen. Left chest wall Port-A-Cath is stable in position with tip at the level of the lower SVC. No acute or suspicious osseous finding. IMPRESSION: Cardiomegaly with central pulmonary vascular congestion and bilateral interstitial edema suggesting volume overload/CHF. No evidence of pneumonia. Electronically Signed   By: Roxy Horseman.D.  On: 04/22/2016 14:14   Dg Chest 2 View  Result Date: 04/13/2016 CLINICAL DATA:  History of sickle cell disease. Patient admitted 04/10/2016 with chest pain. Hypoxia and pulmonary edema. EXAM: CHEST  2 VIEW COMPARISON:  PA and lateral chest 04/09/2016 and 01/31/2016. Single-view of the chest 04/11/2016. FINDINGS: Marked cardiomegaly is again seen. There is mild interstitial edema which appears unchanged since yesterday's examination. No pneumothorax or pleural effusion. No consolidative process. Port-A-Cath is in place. Avascular necrosis left humeral head is noted. IMPRESSION: No change in mild interstitial edema in patient with marked cardiomegaly. Electronically Signed   By: Inge Rise M.D.   On: 04/13/2016  10:54   Dg Chest 2 View  Result Date: 04/10/2016 CLINICAL DATA:  Bilateral chest wall pain. Sickle cell disease. Pain began yesterday. EXAM: CHEST  2 VIEW COMPARISON:  03/27/2016 FINDINGS: Left subclavian port extends to the low SVC. Unchanged cardiomegaly. No airspace consolidation. No pleural effusions. Pulmonary vasculature is normal. Hilar and mediastinal contours are unremarkable and unchanged. IMPRESSION: Unchanged cardiomegaly.  No consolidation or effusion. Electronically Signed   By: Andreas Newport M.D.   On: 04/10/2016 00:50   Dg Chest Port 1 View  Result Date: 04/17/2016 CLINICAL DATA:  Lethargy.  Shortness of breath. EXAM: PORTABLE CHEST 1 VIEW COMPARISON:  None. FINDINGS: Stable enlarged cardiac silhouette. Increased density at the left lateral lung base. Interval mild patchy and linear opacity at the right lung base. Left subclavian porta catheter tip in the right atrium. No acute bony abnormality. 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 IMPRESSION: 1. Interval atelectasis or pneumonia at the left lateral lung base. 2. Interval mild atelectasis and possible pneumonia at the right lung base. 3. Stable cardiomegaly. 4. Left subclavian porta catheter tip in the right atrium. This could be retracted 4.5 cm to place it in the superior vena cava. Electronically Signed   By: Claudie Revering M.D.   On: 04/17/2016 09:06   Dg Chest Port 1 View  Result Date: 04/11/2016 CLINICAL DATA:  Initial evaluation for sickle cell pain crisis. Worsened shortness of breath. EXAM: PORTABLE CHEST 1 VIEW COMPARISON:  Prior radiograph from 04/09/2016. FINDINGS: Left-sided Port-A-Cath in place, stable. Severe cardiomegaly not significantly changed. Mediastinal silhouette within normal limits. Lungs mildly hypoinflated. There is slightly worsened pulmonary vascular congestion as compared to previous. Slightly worsened right infrahilar opacity, which may reflect developing infiltrate and/or  atelectasis. More linear opacities at the bilateral lung bases most consistent with atelectasis. No pleural effusion. No pneumothorax. No acute osseous abnormality. IMPRESSION: 1. Severe cardiomegaly with slightly worsened diffuse pulmonary vascular congestion as compared to 04/09/16, which may reflect developing pulmonary edema. 2. Slightly patchy right infrahilar opacity, which may reflect congestion and/or developing infiltrate. 3. Mildly increased superimposed bibasilar atelectasis. Electronically Signed   By: Jeannine Boga M.D.   On: 04/11/2016 06:10    Scheduled Meds: . ambrisentan  5 mg Oral Daily  . aspirin  81 mg Oral Daily  . carvedilol  3.125 mg Oral BID WC  . folic acid  2 mg Oral Daily  . gabapentin  300 mg Oral TID  . HYDROmorphone   Intravenous Q4H  . HYDROmorphone  4 mg Oral Q4H  . hydroxyurea  1,000 mg Oral Q48H  . hydroxyurea  500 mg Oral Q48H  . ketorolac  30 mg Intravenous Q6H  . L-glutamine  15 g Oral BID  . morphine  30 mg Oral Q12H  . rivaroxaban  20 mg Oral Q supper  . senna-docusate  1 tablet Oral BID  . torsemide  20 mg Oral Daily   Continuous Infusions: . dextrose 5 % and 0.45% NaCl 10 mL/hr at 05/02/16 1134    Principal Problem:   Sickle cell pain crisis (HCC) Active Problems:   Pulmonary HTN   Chronic respiratory failure with hypoxia (HCC)   Essential hypertension   Chronic pain syndrome   In excess of 35 minutes spent during this visit. Greater than 50% involved face to face contact with the patient for assessment, counseling and coordination of care.

## 2016-05-04 NOTE — Progress Notes (Signed)
Patient ID: [name], male   DOB: March 28, 1980, 36 y.o.   MRN: [id number] Late Entry from Yesterday (05/03/2016) at 1:15 pm  Arterial blood draw attempted however results indicate venous gas. Respiratory Therapist attempted to re-draw the ABG but patient refused.   [name]

## 2016-05-05 DIAGNOSIS — Y92239 Unspecified place in hospital as the place of occurrence of the external cause: Secondary | ICD-10-CM

## 2016-05-05 DIAGNOSIS — W19XXXA Unspecified fall, initial encounter: Secondary | ICD-10-CM

## 2016-05-05 LAB — BRAIN NATRIURETIC PEPTIDE: B NATRIURETIC PEPTIDE 5: 639 pg/mL — AB (ref 0.0–100.0)

## 2016-05-05 MED ORDER — HYDROMORPHONE 1 MG/ML IV SOLN
INTRAVENOUS | Status: DC
  Administered 2016-05-05: 23:00:00 via INTRAVENOUS
  Administered 2016-05-06: 8 mg via INTRAVENOUS
  Administered 2016-05-06: 10.1 mg via INTRAVENOUS
  Administered 2016-05-06: 8 mg via INTRAVENOUS
  Administered 2016-05-06: 7 mg via INTRAVENOUS
  Administered 2016-05-06: 6.5 mg via INTRAVENOUS
  Administered 2016-05-06: 9.5 mg via INTRAVENOUS
  Administered 2016-05-07: 3.5 mg via INTRAVENOUS
  Administered 2016-05-07: 7 mg via INTRAVENOUS
  Administered 2016-05-07: 0.5 mg via INTRAVENOUS
  Administered 2016-05-07: 7.5 mg via INTRAVENOUS
  Administered 2016-05-07: 2.5 mg via INTRAVENOUS
  Administered 2016-05-07: 9.5 mg via INTRAVENOUS
  Administered 2016-05-07: 0.5 mg via INTRAVENOUS
  Administered 2016-05-08: 4 mg via INTRAVENOUS
  Administered 2016-05-08: 8.5 mg via INTRAVENOUS
  Administered 2016-05-08: 2 mg via INTRAVENOUS
  Administered 2016-05-08: 15:00:00 via INTRAVENOUS
  Administered 2016-05-08: 7.5 mg via INTRAVENOUS
  Administered 2016-05-08: 7 mg via INTRAVENOUS
  Filled 2016-05-05 (×5): qty 25

## 2016-05-05 NOTE — Progress Notes (Signed)
SICKLE CELL SERVICE PROGRESS NOTE  [name] KWI:[id number] DOB: [birth year] DOA: 04/30/2016 PCP: [name], MD  Assessment/Plan: Principal Problem:   Sickle cell pain crisis ([place]) Active Problems:   Pulmonary HTN   Chronic respiratory failure with hypoxia (HCC)   Essential hypertension   Chronic pain syndrome  1. Acute on Chronic Respiratory Failure: Pt continues to have increased requirements for Oxygen.Likely due to pulmonary edema. I started him on Torsemide yesterday and will check daily weights. Encourage use of incentive spirometer and increased ambulation. Will speak with PMD about setting up appointment with local Pulmonologist in a co-management model with Pul[facility]ologist at [name].  2. Prolonged QTc: EKG from yesterday shows prolonged QTc. Only medications on board known to induce QTc prolongation are benadryl and Zofran. However he has not used Zofran.  Potassium and Magnesium levels normal from yesterday. If  still with QTc prolongation will discontinue Benadryl.  3. Hb SS With Crisis: Continue scheduled oral Dilaudid every four hours in addition to MS Contin. Decrease  PCA bolus to .5 mg. Encourage ICS use and ambulation. 4. Anemia of Chronic Disease: Hb mildly decreased but still at baseline. Continue Hydrea.  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  6. Leukocytosis: Improved. No evidence of infection.  7. Chronic pain: Continue MS Contin 8. Chronic Anticoagulation: Pt on Xarelto for recurrent PE's. Continue Xarelto.  9. Benign HTN: BP will controlled with current medications. 10. Constipation: Pt has  MiraLax ordered. Encouraged patient to use.  11. S/P fall: Pt fell after climbing on a chair to turn off the TV.    Code Status: Full Code Family Communication: N/A Disposition Plan: Not yet ready for Discharge  [place].  Pager [phone]. If 7PM-7AM, please contact night-coverage.  05/05/2016, 5:36 PM  LOS: 5 days   Interim History: Pt  continues to rate pain in the left chest wall as 8/10. Pt had a fall last night after he climbed in a chair to turn the TV off. He denies any trauma, loss of consciousness or focal neurological deficits. He reports that he feels no effects of the fall.    Review of Records:  2-D ECHO ([facility]-01/06/16): Summary Technically adequate study Dilated right atrium and right ventricle with abnormal right ventricular systolic function consistent with acute strain on the right ventricle such as pulmonary embolus Other chambers appear structurally normal Normal LV systolic function , flattened intraventricular septum consistent with pressure overload on the right side, normal LV systolic ejection fraction calculated at 68% Valves appear structurally normal mild mitral regurgitation Moderate to severe tricuspid regurgitation with moderate pulmonary hypertension right ventricular systolic pressure estimated of 60 mmHg No pericardial effusion The findings are consistent with acute/ recent pulmonary embolus Signature   Consultants:  None  Procedures:  None  Antibiotics:  None  Objective: Vitals:   05/05/16 0734 05/05/16 1144 05/05/16 1508 05/05/16 1600  BP:   128/83   Pulse:   84   Resp: 18 17 18 16   Temp:   97.9 F (36.6 C)   TempSrc:   Oral   SpO2: 91% 90% 92% 90%  Weight:      Height:       Weight change:   Intake/Output Summary (Last 24 hours) at 05/05/16 1736 Last data filed at 05/05/16 1509  Gross per 24 hour  Intake              871 ml  Output  4600 ml  Net            -3729 ml    General: Alert, awake, oriented x3, in mild distress due to pain.  HEENT: [place]/AT PEERL, EOMI, anicteric.  Heart: Regular rate and rhythm, II/VI systolic ejection murmurs at base, no rubs or gallops, PMI non-displaced, no heaves or thrills on palpation.  Lungs: Clear to auscultation except at b/l bases where there are mild crackles. No wheezing or rhonchi noted. No  increased vocal fremitus resonant to percussion  Abdomen: Soft, nontender, nondistended, positive bowel sounds, no masses no hepatosplenomegaly noted.  Neuro: No focal neurological deficits noted cranial nerves II through XII grossly intact. . Strength at functional baseline in bilateral upper and lower extremities. Musculoskeletal: No warmth swelling or erythema around joints, no spinal tenderness noted. Psychiatric: Patient alert and oriented x3, good insight and cognition, good recent to remote recall.    Data Reviewed: Basic Metabolic Panel:  Recent Labs Lab 04/29/16 0855 04/29/16 1940 05/02/16 1215 05/04/16 1508  NA 137 135 135 136  K 3.3* 3.5 3.6 4.2  CL 109 107 107 109  CO2 22 21* 22 23  GLUCOSE 100* 100* 122* 124*  BUN 9 12 13 19   CREATININE 0.77 0.97 0.71 0.88  CALCIUM 8.8* 9.2 8.5* 8.3*  MG  --   --   --  2.0   Liver Function Tests:  Recent Labs Lab 04/29/16 0855  AST 36  ALT 37  ALKPHOS 106  BILITOT 5.5*  PROT 7.7  ALBUMIN 3.9   No results for input(s): LIPASE, AMYLASE in the last 168 hours. No results for input(s): AMMONIA in the last 168 hours. CBC:  Recent Labs Lab 04/29/16 0855 04/29/16 1940 05/02/16 1215 05/04/16 1508  WBC 14.7* 18.5* 15.7* 14.3*  NEUTROABS 10.6*  --  8.9* 8.6*  HGB 8.1* 8.1* 7.1* 6.7*  HCT 23.1* 22.9* 20.4* 19.0*  MCV 88.8 88.1 89.1 88.0  PLT 466* 465* 454* 479*   Cardiac Enzymes:  Recent Labs Lab 05/04/16 1508  TROPONINI 0.05*   BNP (last 3 results)  Recent Labs  04/14/16 0500 04/21/16 1139 05/05/16 1206  BNP 315.7* 321.8* 639.0*    ProBNP (last 3 results) No results for input(s): PROBNP in the last 8760 hours.  CBG: No results for input(s): GLUCAP in the last 168 hours.  No results found for this or any previous visit (from the past 240 hour(s)).   Studies: Dg Chest 2 View  Result Date: 04/29/2016 CLINICAL DATA:  Acute onset of central chest throbbing and rib pain. Initial encounter. EXAM: CHEST  2  VIEW COMPARISON:  Chest radiograph performed 04/22/2016 FINDINGS: The lungs are well-aerated. Mild vascular congestion is noted. There is no evidence of focal opacification, pleural effusion or pneumothorax. The heart is enlarged. A left-sided chest port noted ending about the distal SVC. No acute osseous abnormalities are seen. Clips are noted within the right upper quadrant, reflecting prior cholecystectomy. IMPRESSION: Mild vascular congestion and cardiomegaly. Lungs remain grossly clear. Electronically Signed   By: Garald Balding M.D.   On: 04/29/2016 19:53   Dg Chest 2 View  Result Date: 04/22/2016 CLINICAL DATA:  Sickle cell pain crisis, shortness of breath today. History of hypertension. EXAM: CHEST  2 VIEW COMPARISON:  Chest x-rays dated 04/17/2016 and 02/11/2016. FINDINGS: Cardiomegaly is stable. There is central pulmonary vascular congestion. Prominent interstitial markings again noted bilaterally suggesting associated interstitial edema. No evidence of overt alveolar pulmonary edema. No confluent opacity to suggest a developing pneumonia. No  pleural effusion or pneumothorax seen. Left chest wall Port-A-Cath is stable in position with tip at the level of the lower SVC. No acute or suspicious osseous finding. IMPRESSION: Cardiomegaly with central pulmonary vascular congestion and bilateral interstitial edema suggesting volume overload/CHF. No evidence of pneumonia. Electronically Signed   By: Franki Cabot M.D.   On: 04/22/2016 14:14   Dg Chest 2 View  Result Date: 04/13/2016 CLINICAL DATA:  History of sickle cell disease. Patient admitted 04/10/2016 with chest pain. Hypoxia and pulmonary edema. EXAM: CHEST  2 VIEW COMPARISON:  PA and lateral chest 04/09/2016 and 01/31/2016. Single-view of the chest 04/11/2016. FINDINGS: Marked cardiomegaly is again seen. There is mild interstitial edema which appears unchanged since yesterday's examination. No pneumothorax or pleural effusion. No consolidative  process. Port-A-Cath is in place. Avascular necrosis left humeral head is noted. IMPRESSION: No change in mild interstitial edema in patient with marked cardiomegaly. Electronically Signed   By: Inge Rise M.D.   On: 04/13/2016 10:54   Dg Chest 2 View  Result Date: 04/10/2016 CLINICAL DATA:  Bilateral chest wall pain. Sickle cell disease. Pain began yesterday. EXAM: CHEST  2 VIEW COMPARISON:  03/27/2016 FINDINGS: Left subclavian port extends to the low SVC. Unchanged cardiomegaly. No airspace consolidation. No pleural effusions. Pulmonary vasculature is normal. Hilar and mediastinal contours are unremarkable and unchanged. IMPRESSION: Unchanged cardiomegaly.  No consolidation or effusion. Electronically Signed   By: Andreas Newport M.D.   On: 04/10/2016 00:50   Dg Chest Port 1 View  Result Date: 04/17/2016 CLINICAL DATA:  Lethargy.  Shortness of breath. EXAM: PORTABLE CHEST 1 VIEW COMPARISON:  None. FINDINGS: Stable enlarged cardiac silhouette. Increased density at the left lateral lung base. Interval mild patchy and linear opacity at the right lung base. Left subclavian porta catheter tip in the right atrium. No acute bony abnormality. 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 IMPRESSION: 1. Interval atelectasis or pneumonia at the left lateral lung base. 2. Interval mild atelectasis and possible pneumonia at the right lung base. 3. Stable cardiomegaly. 4. Left subclavian porta catheter tip in the right atrium. This could be retracted 4.5 cm to place it in the superior vena cava. Electronically Signed   By: Claudie Revering M.D.   On: 04/17/2016 09:06   Dg Chest Port 1 View  Result Date: 04/11/2016 CLINICAL DATA:  Initial evaluation for sickle cell pain crisis. Worsened shortness of breath. EXAM: PORTABLE CHEST 1 VIEW COMPARISON:  Prior radiograph from 04/09/2016. FINDINGS: Left-sided Port-A-Cath in place, stable. Severe cardiomegaly not significantly changed. Mediastinal  silhouette within normal limits. Lungs mildly hypoinflated. There is slightly worsened pulmonary vascular congestion as compared to previous. Slightly worsened right infrahilar opacity, which may reflect developing infiltrate and/or atelectasis. More linear opacities at the bilateral lung bases most consistent with atelectasis. No pleural effusion. No pneumothorax. No acute osseous abnormality. IMPRESSION: 1. Severe cardiomegaly with slightly worsened diffuse pulmonary vascular congestion as compared to 04/09/16, which may reflect developing pulmonary edema. 2. Slightly patchy right infrahilar opacity, which may reflect congestion and/or developing infiltrate. 3. Mildly increased superimposed bibasilar atelectasis. Electronically Signed   By: Jeannine Boga M.D.   On: 04/11/2016 06:10    Scheduled Meds: . ambrisentan  5 mg Oral Daily  . aspirin  81 mg Oral Daily  . carvedilol  3.125 mg Oral BID WC  . folic acid  2 mg Oral Daily  . gabapentin  300 mg Oral TID  . HYDROmorphone   Intravenous Q4H  . HYDROmorphone  4 mg Oral Q4H  . hydroxyurea  1,000 mg Oral Q48H  . hydroxyurea  500 mg Oral Q48H  . ketorolac  30 mg Intravenous Q6H  . L-glutamine  15 g Oral BID  . morphine  30 mg Oral Q12H  . rivaroxaban  20 mg Oral Q supper  . senna-docusate  1 tablet Oral BID  . torsemide  20 mg Oral Daily   Continuous Infusions: . dextrose 5 % and 0.45% NaCl 10 mL/hr at 05/02/16 1134    Principal Problem:   Sickle cell pain crisis (HCC) Active Problems:   Pulmonary HTN   Chronic respiratory failure with hypoxia (HCC)   Essential hypertension   Chronic pain syndrome   In excess of 35 minutes spent during this visit. Greater than 50% involved face to face contact with the patient for assessment, counseling and coordination of care.

## 2016-05-05 NOTE — Progress Notes (Deleted)
   05/05/16 0230  What Happened  Was fall witnessed? No  Was patient injured? No (scrap on right forearm)  Patient found on floor  Found by Staff-comment (Cristina Ceniceros)  Stated prior activity ambulating-unassisted  Follow Up  MD notified K. Schorr  Time MD notified [phone]  Family notified No- patient refusal  Additional tests No  Simple treatment Other (comment) (Gauze on forearm)  Progress note created (see row info) Yes  Adult Fall Risk Assessment  Risk Factor Category (scoring not indicated) Fall has occurred during this admission (document High fall risk)  Patient's Fall Risk High Fall Risk (>13 points)  Adult Fall Risk Interventions  Required Bundle Interventions *See Row Information* High fall risk - low, moderate, and high requirements implemented  Additional Interventions Room near nurses station;Use of appropriate toileting equipment (bedpan, BSC, etc.)  Screening for Fall Injury Risk  Risk For Fall Injury- See Row Information  None identified  Required Injury Bundle Interventions *See Row Information* Injury Bundle Implemented Except Low Bed  Screening for Fall Injury Risk Interventions  Specialty Low Bed Contraindicated Centrella Smart Bed  Additional Interventions (door open bed alarm on)  Pain Assessment  Pain Assessment No/denies pain  Pain Score 0  PCA/Epidural/Spinal Assessment  Respiratory Pattern Labored;Symmetrical  Patient/Family Education Done  Epidural Specific Assessment  Color/Movement/Sensation (CMS) Normal  Neurological  Neuro (WDL) WDL  Level of Consciousness Alert  Orientation Level Oriented X4  Cognition Appropriate at baseline;Follows commands  Speech Clear  Musculoskeletal  Musculoskeletal (WDL) X  Generalized Weakness Yes  Weight Bearing Restrictions No  Musculoskeletal Details  RUE Full movement  LUE Full movement  RLE Full movement  LLE Full movement  Integumentary  Integumentary (WDL) X  Skin Color Appropriate for ethnicity  Skin  Condition Dry  Skin Integrity Abrasion  Abrasion Location Arm  Abrasion Location Orientation Right  Abrasion Intervention Gauze  Skin Turgor Non-tenting

## 2016-05-05 NOTE — Progress Notes (Signed)
05/05/16 0230  What Happened  Was fall witnessed? No  Was patient injured? No (scrap on right forearm)  Patient found on floor  Found by Staff-comment (Joelly Bolanos)  Stated prior activity ambulating-unassisted  Follow Up  MD notified K. Schorr  Time MD notified [phone]  Family notified No- patient refusal  Additional tests No  Simple treatment Other (comment) (Gauze on forearm)  Progress note created (see row info) Yes  Adult Fall Risk Assessment  Risk Factor Category (scoring not indicated) Fall has occurred during this admission (document High fall risk)  Patient's Fall Risk High Fall Risk (>13 points)  Adult Fall Risk Interventions  Required Bundle Interventions *See Row Information* High fall risk - low, moderate, and high requirements implemented  Additional Interventions Room near nurses station;Use of appropriate toileting equipment (bedpan, BSC, etc.)  Screening for Fall Injury Risk  Risk For Fall Injury- See Row Information  None identified  Required Injury Bundle Interventions *See Row Information* Injury Bundle Implemented Except Low Bed  Screening for Fall Injury Risk Interventions  Specialty Low Bed Contraindicated Centrella Smart Bed  Additional Interventions (door open bed alarm on)  Pain Assessment  Pain Assessment No/denies pain  Pain Score 0  PCA/Epidural/Spinal Assessment  Respiratory Pattern Labored;Symmetrical  Patient/Family Education Done  Epidural Specific Assessment  Color/Movement/Sensation (CMS) Normal  Neurological  Neuro (WDL) WDL  Level of Consciousness Alert  Orientation Level Oriented X4  Cognition Appropriate at baseline;Follows commands  Speech Clear  Pupil Assessment  Yes  R Pupil Size (mm) 2  R Pupil Shape Round  R Pupil Reaction Brisk  L Pupil Size (mm) 2  L Pupil Shape Round  L Pupil Reaction Brisk  R Hand Grip Moderate  L Hand Grip Moderate   R Elbow Extension (Push/Biceps) Present  L Elbow Extension (Push/Biceps) Present  R Elbow  Flexion (Pull/Triceps) Present  L Elbow Flexion (Pull/Triceps) Present  R Foot Dorsiflexion Moderate  L Foot Dorsiflexion Moderate  R Foot Plantar Flexion Moderate  L Foot Plantar Flexion Moderate  RUE Motor Response Purposeful movement;Responds to commands  RUE Sensation Full sensation  RUE Motor Strength 5  LUE Motor Response Purposeful movement;Responds to commands  LUE Sensation Full sensation  LUE Motor Strength 5  RLE Motor Response Purposeful movement;Responds to commands  RLE Sensation Full sensation  RLE Motor Strength 5  LLE Motor Response Purposeful movement;Responds to commands  LLE Sensation Full sensation  LLE Motor Strength 5  Neuro Symptoms Drowsiness  Musculoskeletal  Musculoskeletal (WDL) X  Generalized Weakness Yes  Weight Bearing Restrictions No  Musculoskeletal Details  RUE Full movement  LUE Full movement  RLE Full movement  LLE Full movement  Integumentary  Integumentary (WDL) X  Skin Color Appropriate for ethnicity  Skin Condition Dry  Skin Integrity Abrasion  Abrasion Location Arm  Abrasion Location Orientation Right  Abrasion Intervention Gauze  Skin Turgor Non-tenting

## 2016-05-06 ENCOUNTER — Other Ambulatory Visit: Payer: Self-pay | Admitting: Internal Medicine

## 2016-05-06 ENCOUNTER — Inpatient Hospital Stay (HOSPITAL_COMMUNITY): Payer: Medicare Other

## 2016-05-06 DIAGNOSIS — F5101 Primary insomnia: Secondary | ICD-10-CM

## 2016-05-06 LAB — BASIC METABOLIC PANEL WITH GFR
Anion gap: 7 (ref 5–15)
BUN: 24 mg/dL — ABNORMAL HIGH (ref 6–20)
CO2: 27 mmol/L (ref 22–32)
Calcium: 8.8 mg/dL — ABNORMAL LOW (ref 8.9–10.3)
Chloride: 106 mmol/L (ref 101–111)
Creatinine, Ser: 0.95 mg/dL (ref 0.61–1.24)
GFR calc Af Amer: >60 mL/min
GFR calc non Af Amer: >60 mL/min
Glucose, Bld: 127 mg/dL — ABNORMAL HIGH (ref 65–99)
Potassium: 3.7 mmol/L (ref 3.5–5.1)
Sodium: 140 mmol/L (ref 135–145)

## 2016-05-06 LAB — CBC WITH DIFFERENTIAL/PLATELET
Basophils Absolute: 0.1 10*3/uL (ref 0.0–0.1)
Basophils Relative: 1 %
EOS ABS: 0.9 10*3/uL — AB (ref 0.0–0.7)
EOS PCT: 6 %
HEMATOCRIT: 19.3 % — AB (ref 39.0–52.0)
Hemoglobin: 6.8 g/dL — CL (ref 13.0–17.0)
LYMPHS ABS: 2.2 10*3/uL (ref 0.7–4.0)
Lymphocytes Relative: 15 %
MCH: 31.1 pg (ref 26.0–34.0)
MCHC: 35.2 g/dL (ref 30.0–36.0)
MCV: 88.1 fL (ref 78.0–100.0)
MONO ABS: 2.2 10*3/uL — AB (ref 0.1–1.0)
Monocytes Relative: 15 %
NEUTROS PCT: 63 %
Neutro Abs: 9.5 10*3/uL — ABNORMAL HIGH (ref 1.7–7.7)
PLATELETS: 480 10*3/uL — AB (ref 150–400)
RBC: 2.19 MIL/uL — ABNORMAL LOW (ref 4.22–5.81)
RDW: 21.8 % — AB (ref 11.5–15.5)
WBC: 14.9 10*3/uL — ABNORMAL HIGH (ref 4.0–10.5)

## 2016-05-06 LAB — RETICULOCYTES
RBC.: 2.19 MIL/uL — AB (ref 4.22–5.81)
RETIC COUNT ABSOLUTE: 247.5 10*3/uL — AB (ref 19.0–186.0)
Retic Ct Pct: 11.3 % — ABNORMAL HIGH (ref 0.4–3.1)

## 2016-05-06 MED ORDER — MORPHINE SULFATE ER 30 MG PO TBCR: 30.0000 mg | EXTENDED_RELEASE_TABLET | Freq: Two times a day (BID) | ORAL | 0 refills | Status: DC

## 2016-05-06 MED ORDER — HYDROMORPHONE HCL 4 MG PO TABS: 8.0000 mg | ORAL_TABLET | ORAL | 0 refills | Status: AC | PRN

## 2016-05-06 MED ORDER — ZOLPIDEM TARTRATE 10 MG PO TABS: 10.0000 mg | ORAL_TABLET | Freq: Every evening | ORAL | 0 refills | Status: DC | PRN

## 2016-05-06 MED ORDER — IOPAMIDOL (ISOVUE-370) INJECTION 76%
INTRAVENOUS | Status: AC
  Administered 2016-05-06: 75 mL via INTRAVENOUS
  Administered 2016-05-06: 19:00:00
  Filled 2016-05-06: qty 100

## 2016-05-06 NOTE — Consult Note (Signed)
   [facility] CM Inpatient Consult   05/06/2016  [name] 1980-05-20 [id number]    Patient screened for [facility] services. He has declined re-engaging with [facility] in the past on multiple occasions. Chart reviewed. Noted recently new to home o2.Went to bedside to discuss [facility] program again. However, Mr. Sermons was asleep. Will come back at later time. Made inpatient RNCM aware.    Marthenia Rolling, MSN-Ed, RN,BSN [facility] Liaison [phone]

## 2016-05-06 NOTE — Progress Notes (Signed)
SICKLE CELL SERVICE PROGRESS NOTE  [name] FXT:[id number] DOB: [birth year] DOA: 04/30/2016 PCP: [name], MD  Assessment/Plan: Principal Problem:   Sickle cell pain crisis ([place]) Active Problems:   Pulmonary HTN   Chronic respiratory failure with hypoxia (HCC)   Essential hypertension   Chronic pain syndrome  1. Acute on Chronic Respiratory Failure: Pt continues to have increased Oxygen requirements. Will obtain a CTA. Continue diuresis with Torsemide. Will speak with PMD about setting up appointment with local Pulmonologist in a co-management model with Pulmonologist at [facility].  2. Prolonged QTc: re-check EKG tomorrow.  3. Hb SS With Crisis: Continue scheduled oral Dilaudid every four hours in addition to MS Contin. Increase frequency of PCA bolus to every 8 minutes. Encourage ICS use and ambulation. 4. 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  6. Leukocytosis: At baseline.  7. Chronic pain: Continue MS Contin 8. Chronic Anticoagulation: Pt on Xarelto for recurrent PE's. Continue Xarelto.  9. Benign HTN: BP will controlled with current medications. 10. Constipation: Pt has  MiraLax ordered. Encouraged patient to use.    Code Status: Full Code Family Communication: N/A Disposition Plan: Not yet ready for Discharge  [place].  Pager [phone]. If 7PM-7AM, please contact night-coverage.  05/06/2016, 5:06 PM  LOS: 6 days   Interim History: Pt continues to rate pain in the left chest wall as 8/10. He is well appearing however he continues to have an increased Oxygen requirement. He had a BM yesterday.   Review of Records:  2-D ECHO ([facility]-01/06/16): Summary Technically adequate study Dilated right atrium and right ventricle with abnormal right ventricular systolic function consistent with acute strain on the right ventricle such as pulmonary embolus Other chambers appear structurally  normal Normal LV systolic function , flattened intraventricular septum consistent with pressure overload on the right side, normal LV systolic ejection fraction calculated at 68% Valves appear structurally normal mild mitral regurgitation Moderate to severe tricuspid regurgitation with moderate pulmonary hypertension right ventricular systolic pressure estimated of 60 mmHg No pericardial effusion The findings are consistent with acute/ recent pulmonary embolus Signature   Consultants:  None  Procedures:  None  Antibiotics:  None  Objective: Vitals:   05/06/16 1044 05/06/16 1158 05/06/16 1417 05/06/16 1600  BP: 120/72  104/66   Pulse: 87  81   Resp: 16 15 18  (!) 21  Temp: 98.6 F (37 C)  97.9 F (36.6 C)   TempSrc: Oral  Oral   SpO2: (!) 89% 90% (!) 89% (!) 87%  Weight:      Height:       Weight change: -0.637 kg (-1 lb 6.5 oz)  Intake/Output Summary (Last 24 hours) at 05/06/16 1706 Last data filed at 05/06/16 1044  Gross per 24 hour  Intake              680 ml  Output             1700 ml  Net            -1020 ml    General: Alert, awake, oriented x3, in mild distress due to pain.  HEENT: [place]/AT PEERL, EOMI, anicteric.  Heart: Regular rate and rhythm, II/VI systolic ejection murmurs at base, no rubs or gallops, PMI non-displaced, no heaves or thrills on palpation.  Lungs: Clear to auscultation except at b/l bases where there are mild crackles. No wheezing or rhonchi noted. No increased vocal fremitus resonant to  percussion  Abdomen: Soft, nontender, nondistended, positive bowel sounds, no masses no hepatosplenomegaly noted.  Neuro: No focal neurological deficits noted cranial nerves II through XII grossly intact. . Strength at functional baseline in bilateral upper and lower extremities. Musculoskeletal: No warmth swelling or erythema around joints, no spinal tenderness noted. Psychiatric: Patient alert and oriented x3, good insight and cognition, good recent to  remote recall.    Data Reviewed: Basic Metabolic Panel:  Recent Labs Lab 04/29/16 1940 05/02/16 1215 05/04/16 1508 05/06/16 1126  NA 135 135 136 140  K 3.5 3.6 4.2 3.7  CL 107 107 109 106  CO2 21* 22 23 27   GLUCOSE 100* 122* 124* 127*  BUN 12 13 19  24*  CREATININE 0.97 0.71 0.88 0.95  CALCIUM 9.2 8.5* 8.3* 8.8*  MG  --   --  2.0  --    Liver Function Tests: No results for input(s): AST, ALT, ALKPHOS, BILITOT, PROT, ALBUMIN in the last 168 hours. No results for input(s): LIPASE, AMYLASE in the last 168 hours. No results for input(s): AMMONIA in the last 168 hours. CBC:  Recent Labs Lab 04/29/16 1940 05/02/16 1215 05/04/16 1508 05/06/16 1126  WBC 18.5* 15.7* 14.3* 14.9*  NEUTROABS  --  8.9* 8.6* 9.5*  HGB 8.1* 7.1* 6.7* 6.8*  HCT 22.9* 20.4* 19.0* 19.3*  MCV 88.1 89.1 88.0 88.1  PLT 465* 454* 479* 480*   Cardiac Enzymes:  Recent Labs Lab 05/04/16 1508  TROPONINI 0.05*   BNP (last 3 results)  Recent Labs  04/14/16 0500 04/21/16 1139 05/05/16 1206  BNP 315.7* 321.8* 639.0*    ProBNP (last 3 results) No results for input(s): PROBNP in the last 8760 hours.  CBG: No results for input(s): GLUCAP in the last 168 hours.  No results found for this or any previous visit (from the past 240 hour(s)).   Studies: Dg Chest 2 View  Result Date: 04/29/2016 CLINICAL DATA:  Acute onset of central chest throbbing and rib pain. Initial encounter. EXAM: CHEST  2 VIEW COMPARISON:  Chest radiograph performed 04/22/2016 FINDINGS: The lungs are well-aerated. Mild vascular congestion is noted. There is no evidence of focal opacification, pleural effusion or pneumothorax. The heart is enlarged. A left-sided chest port noted ending about the distal SVC. No acute osseous abnormalities are seen. Clips are noted within the right upper quadrant, reflecting prior cholecystectomy. IMPRESSION: Mild vascular congestion and cardiomegaly. Lungs remain grossly clear. Electronically Signed    By: Garald Balding M.D.   On: 04/29/2016 19:53   Dg Chest 2 View  Result Date: 04/22/2016 CLINICAL DATA:  Sickle cell pain crisis, shortness of breath today. History of hypertension. EXAM: CHEST  2 VIEW COMPARISON:  Chest x-rays dated 04/17/2016 and 02/11/2016. FINDINGS: Cardiomegaly is stable. There is central pulmonary vascular congestion. Prominent interstitial markings again noted bilaterally suggesting associated interstitial edema. No evidence of overt alveolar pulmonary edema. No confluent opacity to suggest a developing pneumonia. No pleural effusion or pneumothorax seen. Left chest wall Port-A-Cath is stable in position with tip at the level of the lower SVC. No acute or suspicious osseous finding. IMPRESSION: Cardiomegaly with central pulmonary vascular congestion and bilateral interstitial edema suggesting volume overload/CHF. No evidence of pneumonia. Electronically Signed   By: Franki Cabot M.D.   On: 04/22/2016 14:14   Dg Chest 2 View  Result Date: 04/13/2016 CLINICAL DATA:  History of sickle cell disease. Patient admitted 04/10/2016 with chest pain. Hypoxia and pulmonary edema. EXAM: CHEST  2 VIEW COMPARISON:  PA and  lateral chest 04/09/2016 and 01/31/2016. Single-view of the chest 04/11/2016. FINDINGS: Marked cardiomegaly is again seen. There is mild interstitial edema which appears unchanged since yesterday's examination. No pneumothorax or pleural effusion. No consolidative process. Port-A-Cath is in place. Avascular necrosis left humeral head is noted. IMPRESSION: No change in mild interstitial edema in patient with marked cardiomegaly. Electronically Signed   By: Inge Rise M.D.   On: 04/13/2016 10:54   Dg Chest 2 View  Result Date: 04/10/2016 CLINICAL DATA:  Bilateral chest wall pain. Sickle cell disease. Pain began yesterday. EXAM: CHEST  2 VIEW COMPARISON:  03/27/2016 FINDINGS: Left subclavian port extends to the low SVC. Unchanged cardiomegaly. No airspace consolidation.  No pleural effusions. Pulmonary vasculature is normal. Hilar and mediastinal contours are unremarkable and unchanged. IMPRESSION: Unchanged cardiomegaly.  No consolidation or effusion. Electronically Signed   By: Andreas Newport M.D.   On: 04/10/2016 00:50   Dg Chest Port 1 View  Result Date: 04/17/2016 CLINICAL DATA:  Lethargy.  Shortness of breath. EXAM: PORTABLE CHEST 1 VIEW COMPARISON:  None. FINDINGS: Stable enlarged cardiac silhouette. Increased density at the left lateral lung base. Interval mild patchy and linear opacity at the right lung base. Left subclavian porta catheter tip in the right atrium. No acute bony abnormality. 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 IMPRESSION: 1. Interval atelectasis or pneumonia at the left lateral lung base. 2. Interval mild atelectasis and possible pneumonia at the right lung base. 3. Stable cardiomegaly. 4. Left subclavian porta catheter tip in the right atrium. This could be retracted 4.5 cm to place it in the superior vena cava. Electronically Signed   By: Claudie Revering M.D.   On: 04/17/2016 09:06   Dg Chest Port 1 View  Result Date: 04/11/2016 CLINICAL DATA:  Initial evaluation for sickle cell pain crisis. Worsened shortness of breath. EXAM: PORTABLE CHEST 1 VIEW COMPARISON:  Prior radiograph from 04/09/2016. FINDINGS: Left-sided Port-A-Cath in place, stable. Severe cardiomegaly not significantly changed. Mediastinal silhouette within normal limits. Lungs mildly hypoinflated. There is slightly worsened pulmonary vascular congestion as compared to previous. Slightly worsened right infrahilar opacity, which may reflect developing infiltrate and/or atelectasis. More linear opacities at the bilateral lung bases most consistent with atelectasis. No pleural effusion. No pneumothorax. No acute osseous abnormality. IMPRESSION: 1. Severe cardiomegaly with slightly worsened diffuse pulmonary vascular congestion as compared to 04/09/16, which may  reflect developing pulmonary edema. 2. Slightly patchy right infrahilar opacity, which may reflect congestion and/or developing infiltrate. 3. Mildly increased superimposed bibasilar atelectasis. Electronically Signed   By: Jeannine Boga M.D.   On: 04/11/2016 06:10    Scheduled Meds: . ambrisentan  5 mg Oral Daily  . aspirin  81 mg Oral Daily  . carvedilol  3.125 mg Oral BID WC  . folic acid  2 mg Oral Daily  . gabapentin  300 mg Oral TID  . HYDROmorphone   Intravenous Q4H  . HYDROmorphone  4 mg Oral Q4H  . hydroxyurea  1,000 mg Oral Q48H  . hydroxyurea  500 mg Oral Q48H  . ketorolac  30 mg Intravenous Q6H  . L-glutamine  15 g Oral BID  . morphine  30 mg Oral Q12H  . rivaroxaban  20 mg Oral Q supper  . senna-docusate  1 tablet Oral BID  . torsemide  20 mg Oral Daily   Continuous Infusions: . dextrose 5 % and 0.45% NaCl 10 mL/hr at 05/02/16 1134    Principal Problem:   Sickle cell pain crisis (  [place]) Active Problems:   Pulmonary HTN   Chronic respiratory failure with hypoxia (HCC)   Essential hypertension   Chronic pain syndrome   In excess of 35 minutes spent during this visit. Greater than 50% involved face to face contact with the patient for assessment, counseling and coordination of care.

## 2016-05-06 NOTE — Care Management Note (Signed)
Case Management Note  Patient Details  Name: GAL FELDHAUS MRN: [id number] Date of Birth: 1981-01-17  Subjective/Objective:   Pt admitted with Sickle Cell pain crisis, Pulmonary HTN, Chronic respiratory failure with hypoxia                 Action/Plan: Pt plan to discharge home. Home Oxygen from [facility].    Expected Discharge Date:                  Expected Discharge Plan:  Home/Self Care  In-House Referral:  NA  Discharge planning Services     Post Acute Care Choice:  NA Choice offered to:  NA  DME Arranged:  Oxygen DME Agency:  [place]:    [place] Agency:     Status of Service:  In process, will continue to follow  If discussed at Long Length of Stay Meetings, dates discussed:    Additional CommentsPurcell Mouton, RN 05/06/2016, 10:16 AM

## 2016-05-07 LAB — TROPONIN I: Troponin I: <0.03 ng/mL (ref <0.03)

## 2016-05-07 LAB — BASIC METABOLIC PANEL
ANION GAP: 6 (ref 5–15)
BUN: 20 mg/dL (ref 6–20)
CALCIUM: 8.9 mg/dL (ref 8.9–10.3)
CO2: 29 mmol/L (ref 22–32)
Chloride: 105 mmol/L (ref 101–111)
Creatinine, Ser: 0.9 mg/dL (ref 0.61–1.24)
GFR calc Af Amer: >60 mL/min (ref >60)
GLUCOSE: 133 mg/dL — AB (ref 65–99)
Potassium: 3.7 mmol/L (ref 3.5–5.1)
Sodium: 140 mmol/L (ref 135–145)

## 2016-05-07 MED ORDER — FUROSEMIDE 10 MG/ML IJ SOLN
20.0000 mg | Freq: Two times a day (BID) | INTRAMUSCULAR | Status: AC
  Administered 2016-05-07 (×2): 20 mg via INTRAVENOUS
  Filled 2016-05-07 (×2): qty 2

## 2016-05-07 NOTE — Progress Notes (Signed)
SICKLE CELL SERVICE PROGRESS NOTE  [name] PPJ:[id number] DOB: 04/29/1980 DOA: 04/30/2016 PCP: [name], MD  Assessment/Plan: Principal Problem:   Sickle cell pain crisis ([place]) Active Problems:   Pulmonary HTN   Chronic respiratory failure with hypoxia (HCC)   Essential hypertension   Chronic pain syndrome  1. Acute on Chronic Respiratory Failure: Pt continues to have increased Oxygen requirements. Will obtain a CTA. Continue diuresis with Torsemide. Will speak with PMD about setting up appointment with local Pulmonologist in a co-management model with Pulmonologist at [facility].  2. Prolonged QTc: Pt continues to have a prolonged QTc which is essentially unchanged from the last few months. He is not on any medications that are known to prolog the QTc and his electrolytes are normal.. Will continue to ensure normal electrolytes while diuresing.  3. Hb SS With Crisis: Continue scheduled oral Dilaudid every four hours in addition to MS Contin. Continue PCA for PRN use while still hospitalized. Encourage ICS use and ambulation. The management of his pain with crisis has been optimized and I feel that there is no more that can be done acutely.  4. 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  6. Leukocytosis: At baseline.  7. Chronic pain: Continue MS Contin. This appears to be poorly controlled  8. Chronic Anticoagulation: Pt on Xarelto for recurrent PE's. Continue Xarelto.  9. Benign HTN: BP will controlled with current medications. 10. Constipation: Pt has  MiraLax ordered. Had BM yesterday.    Code Status: Full Code Family Communication: N/A Disposition Plan: Not yet ready for Discharge  [place].  Pager [phone]. If 7PM-7AM, please contact night-coverage.  05/07/2016, 2:12 PM  LOS: 7 days   Interim History: Pt continues to rate pain in the left chest wall as 8/10. He reports that he had facial pain last  night and this morning which is currently absent. He had a BM yesterday.   Review of Records:  2-D ECHO ([facility]-01/06/16): Summary Technically adequate study Dilated right atrium and right ventricle with abnormal right ventricular systolic function consistent with acute strain on the right ventricle such as pulmonary embolus Other chambers appear structurally normal Normal LV systolic function , flattened intraventricular septum consistent with pressure overload on the right side, normal LV systolic ejection fraction calculated at 68% Valves appear structurally normal mild mitral regurgitation Moderate to severe tricuspid regurgitation with moderate pulmonary hypertension right ventricular systolic pressure estimated of 60 mmHg No pericardial effusion The findings are consistent with acute/ recent pulmonary embolus Signature   Consultants:  None  Procedures:  None  Antibiotics:  None  Objective: Vitals:   05/07/16 0755 05/07/16 0931 05/07/16 1219 05/07/16 1329  BP:  108/72  110/67  Pulse:  78  84  Resp: 13  19 18   Temp:  97 F (36.1 C)  98.2 F (36.8 C)  TempSrc:  Oral  Oral  SpO2: 92% 91% 90% 93%  Weight:      Height:       Weight change: -0.2 kg (-7.1 oz)  Intake/Output Summary (Last 24 hours) at 05/07/16 1412 Last data filed at 05/07/16 1329  Gross per 24 hour  Intake              170 ml  Output             3350 ml  Net            -3180 ml    General:  Alert, awake, oriented x3, in no apparent distress.  HEENT: [place]/AT PEERL, EOMI, anicteric.  Heart: Regular rate and rhythm, II/VI systolic ejection murmurs at base, no rubs or gallops, PMI non-displaced, no heaves or thrills on palpation.  Lungs: Clear to auscultation  No wheezing, rales or rhonchi noted. No increased vocal fremitus resonant to percussion  Abdomen: Soft, nontender, nondistended, positive bowel sounds, no masses no hepatosplenomegaly noted.  Neuro: No focal neurological deficits  noted cranial nerves II through XII grossly intact. . Strength at functional baseline in bilateral upper and lower extremities. Musculoskeletal: No warmth swelling or erythema around joints, no spinal tenderness noted. Psychiatric: Patient alert and oriented x3, good insight and cognition, good recent to remote recall.    Data Reviewed: Basic Metabolic Panel:  Recent Labs Lab 05/02/16 1215 05/04/16 1508 05/06/16 1126  NA 135 136 140  K 3.6 4.2 3.7  CL 107 109 106  CO2 22 23 27   GLUCOSE 122* 124* 127*  BUN 13 19 24*  CREATININE 0.71 0.88 0.95  CALCIUM 8.5* 8.3* 8.8*  MG  --  2.0  --    Liver Function Tests: No results for input(s): AST, ALT, ALKPHOS, BILITOT, PROT, ALBUMIN in the last 168 hours. No results for input(s): LIPASE, AMYLASE in the last 168 hours. No results for input(s): AMMONIA in the last 168 hours. CBC:  Recent Labs Lab 05/02/16 1215 05/04/16 1508 05/06/16 1126  WBC 15.7* 14.3* 14.9*  NEUTROABS 8.9* 8.6* 9.5*  HGB 7.1* 6.7* 6.8*  HCT 20.4* 19.0* 19.3*  MCV 89.1 88.0 88.1  PLT 454* 479* 480*   Cardiac Enzymes:  Recent Labs Lab 05/04/16 1508  TROPONINI 0.05*   BNP (last 3 results)  Recent Labs  04/14/16 0500 04/21/16 1139 05/05/16 1206  BNP 315.7* 321.8* 639.0*    ProBNP (last 3 results) No results for input(s): PROBNP in the last 8760 hours.  CBG: No results for input(s): GLUCAP in the last 168 hours.  No results found for this or any previous visit (from the past 240 hour(s)).   Studies: Dg Chest 2 View  Result Date: 04/29/2016 CLINICAL DATA:  Acute onset of central chest throbbing and rib pain. Initial encounter. EXAM: CHEST  2 VIEW COMPARISON:  Chest radiograph performed 04/22/2016 FINDINGS: The lungs are well-aerated. Mild vascular congestion is noted. There is no evidence of focal opacification, pleural effusion or pneumothorax. The heart is enlarged. A left-sided chest port noted ending about the distal SVC. No acute osseous  abnormalities are seen. Clips are noted within the right upper quadrant, reflecting prior cholecystectomy. IMPRESSION: Mild vascular congestion and cardiomegaly. Lungs remain grossly clear. Electronically Signed   By: Garald Balding M.D.   On: 04/29/2016 19:53   Dg Chest 2 View  Result Date: 04/22/2016 CLINICAL DATA:  Sickle cell pain crisis, shortness of breath today. History of hypertension. EXAM: CHEST  2 VIEW COMPARISON:  Chest x-rays dated 04/17/2016 and 02/11/2016. FINDINGS: Cardiomegaly is stable. There is central pulmonary vascular congestion. Prominent interstitial markings again noted bilaterally suggesting associated interstitial edema. No evidence of overt alveolar pulmonary edema. No confluent opacity to suggest a developing pneumonia. No pleural effusion or pneumothorax seen. Left chest wall Port-A-Cath is stable in position with tip at the level of the lower SVC. No acute or suspicious osseous finding. IMPRESSION: Cardiomegaly with central pulmonary vascular congestion and bilateral interstitial edema suggesting volume overload/CHF. No evidence of pneumonia. Electronically Signed   By: Franki Cabot M.D.   On: 04/22/2016 14:14   Dg Chest  2 View  Result Date: 04/13/2016 CLINICAL DATA:  History of sickle cell disease. Patient admitted 04/10/2016 with chest pain. Hypoxia and pulmonary edema. EXAM: CHEST  2 VIEW COMPARISON:  PA and lateral chest 04/09/2016 and 01/31/2016. Single-view of the chest 04/11/2016. FINDINGS: Marked cardiomegaly is again seen. There is mild interstitial edema which appears unchanged since yesterday's examination. No pneumothorax or pleural effusion. No consolidative process. Port-A-Cath is in place. Avascular necrosis left humeral head is noted. IMPRESSION: No change in mild interstitial edema in patient with marked cardiomegaly. Electronically Signed   By: Inge Rise M.D.   On: 04/13/2016 10:54   Dg Chest 2 View  Result Date: 04/10/2016 CLINICAL DATA:   Bilateral chest wall pain. Sickle cell disease. Pain began yesterday. EXAM: CHEST  2 VIEW COMPARISON:  03/27/2016 FINDINGS: Left subclavian port extends to the low SVC. Unchanged cardiomegaly. No airspace consolidation. No pleural effusions. Pulmonary vasculature is normal. Hilar and mediastinal contours are unremarkable and unchanged. IMPRESSION: Unchanged cardiomegaly.  No consolidation or effusion. Electronically Signed   By: Andreas Newport M.D.   On: 04/10/2016 00:50   Ct Angio Chest Pe W Or Wo Contrast  Result Date: 05/06/2016 CLINICAL DATA:  Hypoxia, history of sickle cell EXAM: CT ANGIOGRAPHY CHEST WITH CONTRAST TECHNIQUE: Multidetector CT imaging of the chest was performed using the standard protocol during bolus administration of intravenous contrast. Multiplanar CT image reconstructions and MIPs were obtained to evaluate the vascular anatomy. CONTRAST:  75 cc of Isovue 370 IV COMPARISON:  CXR 04/29/2016, chest CT 03/13/2016 FINDINGS: Cardiovascular: Cardiomegaly. Port catheter tip in the cavoatrial junction. Small pericardial effusion. No acute pulmonary embolus. To the third order branches. No aortic aneurysm or dissection. Mediastinum/Nodes: Mild retrosternal extension of the thyroid gland without focal mass. Trachea and esophagus demonstrate no acute findings. Multiple small prevascular, paratracheal and hilar nodes are again seen without pathologic appearing enlargement. Lungs/Pleura: Perihilar distribution of ground-glass opacities suspicious for mild pulmonary edema or alveolitis secondary to infection or inflammation. No significant pleural effusion or pneumothorax. Upper Abdomen: No acute abnormality in the upper abdomen. 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 Musculoskeletal: Central endplate compressions of several mid thoracic vertebrae consistent with history of sickle cell. No acute osseous appearing abnormality. Review of the MIP images confirms  the above findings. IMPRESSION: Stable cardiomegaly with mild perihilar ground-glass opacities suspicious for pulmonary edema with differential possibilities including alveolitis possibly from an infectious or inflammatory process. No acute pulmonary embolus. Auto infarcted spleen. H- shaped vertebrae in the mid thoracic spine consistent with history of sickle cell. Electronically Signed   By: Ashley Royalty M.D.   On: 05/06/2016 19:11   Dg Chest Port 1 View  Result Date: 04/17/2016 CLINICAL DATA:  Lethargy.  Shortness of breath. EXAM: PORTABLE CHEST 1 VIEW COMPARISON:  None. FINDINGS: Stable enlarged cardiac silhouette. Increased density at the left lateral lung base. Interval mild patchy and linear opacity at the right lung base. Left subclavian porta catheter tip in the right atrium. No acute bony abnormality. 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 IMPRESSION: 1. Interval atelectasis or pneumonia at the left lateral lung base. 2. Interval mild atelectasis and possible pneumonia at the right lung base. 3. Stable cardiomegaly. 4. Left subclavian porta catheter tip in the right atrium. This could be retracted 4.5 cm to place it in the superior vena cava. Electronically Signed   By: Claudie Revering M.D.   On: 04/17/2016 09:06   Dg Chest [facility]  Result Date: 04/11/2016 CLINICAL DATA:  Initial evaluation for sickle cell pain crisis. Worsened shortness of breath. EXAM: PORTABLE CHEST 1 VIEW COMPARISON:  Prior radiograph from 04/09/2016. FINDINGS: Left-sided Port-A-Cath in place, stable. Severe cardiomegaly not significantly changed. Mediastinal silhouette within normal limits. Lungs mildly hypoinflated. There is slightly worsened pulmonary vascular congestion as compared to previous. Slightly worsened right infrahilar opacity, which may reflect developing infiltrate and/or atelectasis. More linear opacities at the bilateral lung bases most consistent with atelectasis. No pleural effusion.  No pneumothorax. No acute osseous abnormality. IMPRESSION: 1. Severe cardiomegaly with slightly worsened diffuse pulmonary vascular congestion as compared to 04/09/16, which may reflect developing pulmonary edema. 2. Slightly patchy right infrahilar opacity, which may reflect congestion and/or developing infiltrate. 3. Mildly increased superimposed bibasilar atelectasis. Electronically Signed   By: Jeannine Boga M.D.   On: 04/11/2016 06:10    Scheduled Meds: . ambrisentan  5 mg Oral Daily  . aspirin  81 mg Oral Daily  . carvedilol  3.125 mg Oral BID WC  . folic acid  2 mg Oral Daily  . furosemide  20 mg Intravenous Q12H  . gabapentin  300 mg Oral TID  . HYDROmorphone   Intravenous Q4H  . HYDROmorphone  4 mg Oral Q4H  . hydroxyurea  1,000 mg Oral Q48H  . hydroxyurea  500 mg Oral Q48H  . L-glutamine  15 g Oral BID  . morphine  30 mg Oral Q12H  . rivaroxaban  20 mg Oral Q supper  . senna-docusate  1 tablet Oral BID  . torsemide  20 mg Oral Daily   Continuous Infusions: . dextrose 5 % and 0.45% NaCl 10 mL/hr at 05/06/16 1753    Principal Problem:   Sickle cell pain crisis (HCC) Active Problems:   Pulmonary HTN   Chronic respiratory failure with hypoxia (HCC)   Essential hypertension   Chronic pain syndrome   In excess of 25 minutes spent during this visit. Greater than 50% involved face to face contact with the patient for assessment, counseling and coordination of care.

## 2016-05-07 NOTE — Progress Notes (Signed)
Patient ambulated 386 feet on unit with NT [place]. Per NT, O2 sats, measured with portable O2 monitor, dropped to 67% during ambulation on 4LO2 via . Patient asymptomatic. NT instructed patient to stop and breathe deeply and O2 sats increased to 94%. Patient returned to room.

## 2016-05-08 DIAGNOSIS — R9431 Abnormal electrocardiogram [ECG] [EKG]: Secondary | ICD-10-CM

## 2016-05-08 LAB — CBC WITH DIFFERENTIAL/PLATELET
BASOS PCT: 1 %
Basophils Absolute: 0.2 10*3/uL — ABNORMAL HIGH (ref 0.0–0.1)
EOS PCT: 6 %
Eosinophils Absolute: 1 10*3/uL — ABNORMAL HIGH (ref 0.0–0.7)
HEMATOCRIT: 19.6 % — AB (ref 39.0–52.0)
HEMOGLOBIN: 6.8 g/dL — AB (ref 13.0–17.0)
LYMPHS ABS: 2.6 10*3/uL (ref 0.7–4.0)
LYMPHS PCT: 16 %
MCH: 31.5 pg (ref 26.0–34.0)
MCHC: 34.7 g/dL (ref 30.0–36.0)
MCV: 90.7 fL (ref 78.0–100.0)
MONOS PCT: 16 %
Monocytes Absolute: 2.6 10*3/uL — ABNORMAL HIGH (ref 0.1–1.0)
NEUTROS ABS: 9.9 10*3/uL — AB (ref 1.7–7.7)
Neutrophils Relative %: 61 %
Platelets: 478 10*3/uL — ABNORMAL HIGH (ref 150–400)
RBC: 2.16 MIL/uL — ABNORMAL LOW (ref 4.22–5.81)
RDW: 22.6 % — AB (ref 11.5–15.5)
WBC: 16.3 10*3/uL — ABNORMAL HIGH (ref 4.0–10.5)

## 2016-05-08 LAB — BASIC METABOLIC PANEL
ANION GAP: 6 (ref 5–15)
BUN: 17 mg/dL (ref 6–20)
CO2: 29 mmol/L (ref 22–32)
Calcium: 8.6 mg/dL — ABNORMAL LOW (ref 8.9–10.3)
Chloride: 104 mmol/L (ref 101–111)
Creatinine, Ser: 0.85 mg/dL (ref 0.61–1.24)
GFR calc Af Amer: >60 mL/min (ref >60)
GFR calc non Af Amer: >60 mL/min (ref >60)
GLUCOSE: 122 mg/dL — AB (ref 65–99)
POTASSIUM: 3.4 mmol/L — AB (ref 3.5–5.1)
Sodium: 139 mmol/L (ref 135–145)

## 2016-05-08 LAB — BLOOD GAS, ARTERIAL
Acid-Base Excess: 6.3 mmol/L — ABNORMAL HIGH (ref 0.0–2.0)
Bicarbonate: 30.8 mmol/L — ABNORMAL HIGH (ref 20.0–28.0)
Drawn by: 312971
O2 CONTENT: 3 L/min
O2 SAT: 87 %
PATIENT TEMPERATURE: 98.6
PCO2 ART: 46.9 mmHg (ref 32.0–48.0)
PO2 ART: 61.8 mmHg — AB (ref 83.0–108.0)
pH, Arterial: 7.432 (ref 7.350–7.450)

## 2016-05-08 LAB — RETICULOCYTES
RBC.: 2.16 MIL/uL — AB (ref 4.22–5.81)
RETIC COUNT ABSOLUTE: 298.1 10*3/uL — AB (ref 19.0–186.0)
Retic Ct Pct: 13.8 % — ABNORMAL HIGH (ref 0.4–3.1)

## 2016-05-08 LAB — TROPONIN I

## 2016-05-08 MED ORDER — FUROSEMIDE 20 MG PO TABS: 20.0000 mg | ORAL_TABLET | Freq: Two times a day (BID) | ORAL | 0 refills | Status: DC

## 2016-05-08 MED ORDER — HEPARIN SOD (PORK) LOCK FLUSH 100 UNIT/ML IV SOLN
500.0000 [IU] | INTRAVENOUS | Status: DC
  Filled 2016-05-08: qty 5

## 2016-05-08 MED ORDER — HEPARIN SOD (PORK) LOCK FLUSH 100 UNIT/ML IV SOLN
500.0000 [IU] | INTRAVENOUS | Status: DC | PRN
  Administered 2016-05-08: 500 [IU]
  Filled 2016-05-08 (×2): qty 5

## 2016-05-08 MED ORDER — TORSEMIDE 20 MG PO TABS: 20.0000 mg | ORAL_TABLET | Freq: Every day | ORAL | 1 refills | Status: DC

## 2016-05-08 NOTE — Discharge Summary (Signed)
[name] MRN: [id number] DOB/AGE: 37-Jan-1982 37 y.o.  Admit date: 04/30/2016 Discharge date: 05/08/2016  Primary Care Physician:  [name], MD   Discharge Diagnoses:   Patient Active Problem List   Diagnosis Date Noted  . Cor pulmonale, chronic ([place]) 08/25/2014    Priority: High  . Pulmonary HTN 06/18/2013    Priority: High  . Functional asplenia     Priority: High  . Acute on chronic respiratory failure with hypoxia (HCC)     Priority: Medium  . Anemia of chronic disease 06/25/2014    Priority: Medium  . Chronic respiratory failure with hypoxia ([place]) 03/14/2014    Priority: Medium  . Chronic anticoagulation 08/22/2013    Priority: Medium  . Avascular necrosis ([place])     Priority: Low  . Acute pulmonary edema ([place]) 04/11/2016  . H/O arterial ischemic stroke 11/11/2015  . Slurred speech 11/10/2015  . Chronic anemia   . Hb-SS disease without crisis ([place]) 10/07/2014  . Prolonged Q-T interval on ECG 09/10/2014  . Troponin level elevated 08/29/2014  . Sickle cell anemia ([place]) 06/25/2014  . PAH (pulmonary artery hypertension) 03/18/2014  . Paralytic strabismus, external ophthalmoplegia   . Chronic pain syndrome 12/12/2013  . Essential hypertension 08/22/2013  . Vitamin D deficiency 02/13/2013  . Sickle cell crisis ([place]) 01/13/2013  . Rib pain 01/13/2013  . Hx of pulmonary embolus 06/29/2012  . 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 Sickle cell pain crisis ([place])     DISCHARGE MEDICATION: Allergies as of 05/08/2016   No Known Allergies     Medication List    TAKE these medications   ambrisentan 5 MG tablet Commonly known as:  LETAIRIS Take 5 mg by mouth daily.   aspirin 81 MG chewable tablet Chew 1 tablet (81 mg total) by mouth daily.   carvedilol 3.125 MG tablet Commonly known as:  COREG Take 1 tablet (3.125 mg total) by mouth 2 (two) times daily with a meal.   cholecalciferol 1000 units tablet Commonly known as:  VITAMIN D Take 2,000 Units  by mouth daily.   folic acid 1 MG tablet Commonly known as:  FOLVITE Take 2 tablets (2 mg total) by mouth daily.   furosemide 20 MG tablet Commonly known as:  LASIX Take 1 tablet (20 mg total) by mouth 2 (two) times daily.   gabapentin 300 MG capsule Commonly known as:  NEURONTIN Take 1 capsule (300 mg total) by mouth 3 (three) times daily.   HYDROmorphone 4 MG tablet Commonly known as:  DILAUDID Take 2 tablets (8 mg total) by mouth every 4 (four) hours as needed for moderate pain or severe pain.   hydroxyurea 500 MG capsule Commonly known as:  HYDREA Take 1000 mg QOD and alternate with 500 mg QOD. May take with food to minimize GI side effects. What changed:  how much to take  how to take this  when to take this  additional instructions   L-glutamine 5 g Pack Powder Packet Commonly known as:  ENDARI Take 15 g by mouth 2 (two) times daily.   morphine 30 MG 12 hr tablet Commonly known as:  MS CONTIN Take 1 tablet (30 mg total) by mouth every 12 (twelve) hours.   potassium chloride SA 20 MEQ tablet Commonly known as:  K-DUR,KLOR-CON Take 2 tablets (40 mEq total) by mouth daily.   rivaroxaban 20 MG Tabs tablet Commonly known as:  XARELTO Take 1 tablet (20 mg total) by mouth at bedtime.   torsemide 20 MG tablet  Commonly known as:  DEMADEX Take 1 tablet (20 mg total) by mouth daily.   zolpidem 10 MG tablet Commonly known as:  AMBIEN Take 1 tablet (10 mg total) by mouth at bedtime as needed for sleep.         Consults:    SIGNIFICANT DIAGNOSTIC STUDIES:  Dg Chest 2 View  Result Date: 04/29/2016 CLINICAL DATA:  Acute onset of central chest throbbing and rib pain. Initial encounter. EXAM: CHEST  2 VIEW COMPARISON:  Chest radiograph performed 04/22/2016 FINDINGS: The lungs are well-aerated. Mild vascular congestion is noted. There is no evidence of focal opacification, pleural effusion or pneumothorax. The heart is enlarged. A left-sided chest port noted  ending about the distal SVC. No acute osseous abnormalities are seen. Clips are noted within the right upper quadrant, reflecting prior cholecystectomy. IMPRESSION: Mild vascular congestion and cardiomegaly. Lungs remain grossly clear. Electronically Signed   By: Garald Balding M.D.   On: 04/29/2016 19:53   Dg Chest 2 View  Result Date: 04/22/2016 CLINICAL DATA:  Sickle cell pain crisis, shortness of breath today. History of hypertension. EXAM: CHEST  2 VIEW COMPARISON:  Chest x-rays dated 04/17/2016 and 02/11/2016. FINDINGS: Cardiomegaly is stable. There is central pulmonary vascular congestion. Prominent interstitial markings again noted bilaterally suggesting associated interstitial edema. No evidence of overt alveolar pulmonary edema. No confluent opacity to suggest a developing pneumonia. No pleural effusion or pneumothorax seen. Left chest wall Port-A-Cath is stable in position with tip at the level of the lower SVC. No acute or suspicious osseous finding. IMPRESSION: Cardiomegaly with central pulmonary vascular congestion and bilateral interstitial edema suggesting volume overload/CHF. No evidence of pneumonia. Electronically Signed   By: Franki Cabot M.D.   On: 04/22/2016 14:14   Dg Chest 2 View  Result Date: 04/13/2016 CLINICAL DATA:  History of sickle cell disease. Patient admitted 04/10/2016 with chest pain. Hypoxia and pulmonary edema. EXAM: CHEST  2 VIEW COMPARISON:  PA and lateral chest 04/09/2016 and 01/31/2016. Single-view of the chest 04/11/2016. FINDINGS: Marked cardiomegaly is again seen. There is mild interstitial edema which appears unchanged since yesterday's examination. No pneumothorax or pleural effusion. No consolidative process. Port-A-Cath is in place. Avascular necrosis left humeral head is noted. IMPRESSION: No change in mild interstitial edema in patient with marked cardiomegaly. Electronically Signed   By: Inge Rise M.D.   On: 04/13/2016 10:54   Dg Chest 2  View  Result Date: 04/10/2016 CLINICAL DATA:  Bilateral chest wall pain. Sickle cell disease. Pain began yesterday. EXAM: CHEST  2 VIEW COMPARISON:  03/27/2016 FINDINGS: Left subclavian port extends to the low SVC. Unchanged cardiomegaly. No airspace consolidation. No pleural effusions. Pulmonary vasculature is normal. Hilar and mediastinal contours are unremarkable and unchanged. IMPRESSION: Unchanged cardiomegaly.  No consolidation or effusion. Electronically Signed   By: Andreas Newport M.D.   On: 04/10/2016 00:50   Ct Angio Chest Pe W Or Wo Contrast  Result Date: 05/06/2016 CLINICAL DATA:  Hypoxia, history of sickle cell EXAM: CT ANGIOGRAPHY CHEST WITH CONTRAST TECHNIQUE: Multidetector CT imaging of the chest was performed using the standard protocol during bolus administration of intravenous contrast. Multiplanar CT image reconstructions and MIPs were obtained to evaluate the vascular anatomy. CONTRAST:  75 cc of Isovue 370 IV COMPARISON:  CXR 04/29/2016, chest CT 03/13/2016 FINDINGS: Cardiovascular: Cardiomegaly. Port catheter tip in the cavoatrial junction. Small pericardial effusion. No acute pulmonary embolus. To the third order branches. No aortic aneurysm or dissection. Mediastinum/Nodes: Mild retrosternal extension of the thyroid gland  without focal mass. Trachea and esophagus demonstrate no acute findings. Multiple small prevascular, paratracheal and hilar nodes are again seen without pathologic appearing enlargement. Lungs/Pleura: Perihilar distribution of ground-glass opacities suspicious for mild pulmonary edema or alveolitis secondary to infection or inflammation. No significant pleural effusion or pneumothorax. Upper Abdomen: No acute abnormality in the upper abdomen. 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 Musculoskeletal: Central endplate compressions of several mid thoracic vertebrae consistent with history of sickle cell. No acute osseous  appearing abnormality. Review of the MIP images confirms the above findings. IMPRESSION: Stable cardiomegaly with mild perihilar ground-glass opacities suspicious for pulmonary edema with differential possibilities including alveolitis possibly from an infectious or inflammatory process. No acute pulmonary embolus. Auto infarcted spleen. H- shaped vertebrae in the mid thoracic spine consistent with history of sickle cell. Electronically Signed   By: Ashley Royalty M.D.   On: 05/06/2016 19:11   Dg Chest Port 1 View  Result Date: 04/17/2016 CLINICAL DATA:  Lethargy.  Shortness of breath. EXAM: PORTABLE CHEST 1 VIEW COMPARISON:  None. FINDINGS: Stable enlarged cardiac silhouette. Increased density at the left lateral lung base. Interval mild patchy and linear opacity at the right lung base. Left subclavian porta catheter tip in the right atrium. No acute bony abnormality. 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 IMPRESSION: 1. Interval atelectasis or pneumonia at the left lateral lung base. 2. Interval mild atelectasis and possible pneumonia at the right lung base. 3. Stable cardiomegaly. 4. Left subclavian porta catheter tip in the right atrium. This could be retracted 4.5 cm to place it in the superior vena cava. Electronically Signed   By: Claudie Revering M.D.   On: 04/17/2016 09:06   Dg Chest Port 1 View  Result Date: 04/11/2016 CLINICAL DATA:  Initial evaluation for sickle cell pain crisis. Worsened shortness of breath. EXAM: PORTABLE CHEST 1 VIEW COMPARISON:  Prior radiograph from 04/09/2016. FINDINGS: Left-sided Port-A-Cath in place, stable. Severe cardiomegaly not significantly changed. Mediastinal silhouette within normal limits. Lungs mildly hypoinflated. There is slightly worsened pulmonary vascular congestion as compared to previous. Slightly worsened right infrahilar opacity, which may reflect developing infiltrate and/or atelectasis. More linear opacities at the bilateral lung bases  most consistent with atelectasis. No pleural effusion. No pneumothorax. No acute osseous abnormality. IMPRESSION: 1. Severe cardiomegaly with slightly worsened diffuse pulmonary vascular congestion as compared to 04/09/16, which may reflect developing pulmonary edema. 2. Slightly patchy right infrahilar opacity, which may reflect congestion and/or developing infiltrate. 3. Mildly increased superimposed bibasilar atelectasis. Electronically Signed   By: Jeannine Boga M.D.   On: 04/11/2016 06:10        No results found for this or any previous visit (from the past 240 hour(s)).  BRIEF ADMITTING H & P: HJALMAR BALLENGEE is a 36 y.o. male with a past medical history significant for sickle cell disease and pulmonary hypertension, chronic respiratory failure on home O2, hypertension, history of stroke, history of PE on Xarelto who presents with complaints of sickle cell pain.  The patient was in his usual state of health until about 24 hours ago when he started to develop pain in his ribs, typical of his sickle cell pain crises. He was seen in the ER yesterday morning, treated with IV hydromorphone and discharged, within a few hours he developed pain in the area of his sternum, that was sharp, constant, severe, similar in character to his sickle cell pain, but new in the sense that it was located in  his chest. This is not pleuritic, positional, exertional. It was not associated with fever, cough, difficulty breathing, shortness of breath, sputum production.  He did return to the emergency room tonight, had a normal troponin, chest x-ray without infiltrate, and ECG was no change from his previous. Admission was requested from [facility], primarily to observe the patient overnight given his new chest pain, but the patient felt he could manage well at home and asked to be discharged.  He returned within 2 hours with persistent, unrelieved pain in his chest and requested admission for PCA.  ED  course: -Afebrile, heart rate 110, respirations 18, blood pressure 116/84, pulse oximetry 88% on room air, improved to 93% on nasal cannula -Na 137, K 3.3, Cr 0.77, WBC 14.7K, Hgb 8.1   Hospital Course:  Present on Admission: . Sickle cell pain crisis ([place]) . Essential hypertension . Chronic pain syndrome . Chronic respiratory failure with hypoxia ([place]) . Pulmonary HTN  Pt was admitted with Hb SS with crisis. His pain was managed with IV Dilaudid, Toradol and IVF. As his function improved transition was made to oral analgesics. MS Contin was continued for management of his chronic pain. However his hospital course was complicated by Acute on Chronic Respiratory failure caused by acute pulmonary edema in a setting of PAH and Cor Pulmonale. This was evident clinically and on CT imaging. Pt admitted that he was not taking his Torsemide at home although it was recommended at the time of previous discharge from the hospital. Additionally, he has not been able to keep his appointments with Pul[facility]ologist at [facility] due to multiple issues. He was resumed on Torsemide and additionally given short course of lasix while keeping a close check on renal function and electrolytes. His oxygen was appropriately weaned down and at the time of discharge, he was at his baseline of 3-4 l/min of Oxygen. ABG showed that pAO2 was 61 mmHg with 3 l/min thus Oxygen was increased to 4 L/min at the time of discharge. He remained at baseline Hb throughout his hospital course and was continued on Hydrea and Endari throughout his hospital course.  Pt is also noted to have a prolonged QTc without any inciting medications. I discussed this with Cardiology and they agreed that there was no acute intervention necessary and he should follow up with his PMD at discharge. 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 He was continued on Xarelto throughout his hospital course without interruption.     Disposition and Follow-up: Pt  is discharged home in stable condition and is to follow up with Primary Provider in 3 days. At which time his electrolytes should be checked.  Discharge Instructions    Activity as tolerated - No restrictions    Complete by:  As directed    Diet general    Complete by:  As directed       DISCHARGE EXAM:  General: Alert, awake, oriented x3, in mild distress.  HEENT: [place]/AT PEERL, EOMI, anicteric Neck: Trachea midline, no masses, no thyromegal,y no JVD, no carotid bruit OROPHARYNX: Moist, No exudate/ erythema/lesions.  Heart: Regular rate and rhythm, without murmurs, rubs, gallops or S3. PMI non-displaced. Exam reveals no decreased pulses. Pulmonary/Chest: Normal effort. Breath sounds normal. No. Apnea. Clear to auscultation,no stridor,  no wheezing and no rhonchi noted. No respiratory distress and no tenderness noted. Abdomen: Soft, nontender, nondistended, normal bowel sounds, no masses no hepatosplenomegaly noted. No fluid wave and no ascites. There is no guarding or rebound. Neuro: Alert and  oriented to person, place and time. Normal motor skills, Displays no atrophy or tremors and exhibits normal muscle tone.  No new focal neurological deficits noted cranial nerves II through XII grossly intact. He does have baseline slurring of speech since his last CVA in October of 2017. No sensory deficit noted. Strength at baseline in bilateral upper and lower extremities. Gait normal. Musculoskeletal: No warmth swelling or erythema around joints, no spinal tenderness noted. Psychiatric: Patient alert and oriented x3, good insight and cognition, good recent to remote recall.Mood, affect and judgement normal Lymph node survey: No cervical axillary or inguinal lymphadenopathy noted. Skin: Skin is warm and dry. No bruising, no ecchymosis and no rash noted. Pt is not diaphoretic. No erythema. No pallor     Blood pressure 109/77, pulse 80, temperature 98.2 F (36.8 C), temperature source Oral, resp.  rate 14, height 6' (1.829 m), weight 90.4 kg (199 lb 4.7 oz), SpO2 91 %.   Recent Labs  05/07/16 1456 05/08/16 0957  NA 140 139  K 3.7 3.4*  CL 105 104  CO2 29 29  GLUCOSE 133* 122*  BUN 20 17  CREATININE 0.90 0.85  CALCIUM 8.9 8.6*   No results for input(s): AST, ALT, ALKPHOS, BILITOT, PROT, ALBUMIN in the last 72 hours. No results for input(s): LIPASE, AMYLASE in the last 72 hours.  Recent Labs  05/06/16 1126 05/08/16 0957  WBC 14.9* 16.3*  NEUTROABS 9.5* 9.9*  HGB 6.8* 6.8*  HCT 19.3* 19.6*  MCV 88.1 90.7  PLT 480* 478*     Total time spent including face to face and decision making was greater than 30 minutes  Signed: Ronell Duffus A. 05/08/2016, 3:46 PM

## 2016-05-08 NOTE — Progress Notes (Signed)
Patient ambulated in hall on 3L [place]. Using portable O2 monitor, oxygen began at 95% and steadily dropped to 86%. Patient did not feel short of breath. Upon returning to room and back in bed oxygen return to 94%.

## 2016-05-09 ENCOUNTER — Telehealth: Payer: Self-pay | Admitting: Hematology

## 2016-05-09 NOTE — Telephone Encounter (Signed)
Called patient advised of a follow up appointment with Dr. Doreene Burke on 05-18-16 @3 :72.  Gave patient the address of [facility] [facility] as he will see Dr. Doreene Burke at that location. Patient verbalizes understanding.

## 2016-05-10 ENCOUNTER — Emergency Department (HOSPITAL_COMMUNITY)
Admission: EM | Admit: 2016-05-10 | Discharge: 2016-05-10 | Disposition: A | Discharge status: Home / Self Care | Payer: Medicare Other | Attending: Emergency Medicine | Admitting: Emergency Medicine

## 2016-05-10 ENCOUNTER — Encounter (HOSPITAL_COMMUNITY): Payer: Self-pay

## 2016-05-10 ENCOUNTER — Encounter (HOSPITAL_COMMUNITY): Payer: Self-pay | Admitting: Nurse Practitioner

## 2016-05-10 ENCOUNTER — Non-Acute Institutional Stay (HOSPITAL_BASED_OUTPATIENT_CLINIC_OR_DEPARTMENT_OTHER)
Admission: AD | Admit: 2016-05-10 | Discharge: 2016-05-10 | Disposition: A | Discharge status: Home / Self Care | Payer: Medicare Other | Source: Ambulatory Visit | Attending: Internal Medicine | Admitting: Internal Medicine

## 2016-05-10 DIAGNOSIS — D5700 Hb-SS disease with crisis, unspecified: Secondary | ICD-10-CM | POA: Diagnosis present

## 2016-05-10 DIAGNOSIS — Z7982 Long term (current) use of aspirin: Secondary | ICD-10-CM | POA: Insufficient documentation

## 2016-05-10 DIAGNOSIS — Z87891 Personal history of nicotine dependence: Secondary | ICD-10-CM | POA: Insufficient documentation

## 2016-05-10 DIAGNOSIS — Z79899 Other long term (current) drug therapy: Secondary | ICD-10-CM

## 2016-05-10 DIAGNOSIS — Z8673 Personal history of transient ischemic attack (TIA), and cerebral infarction without residual deficits: Secondary | ICD-10-CM | POA: Insufficient documentation

## 2016-05-10 DIAGNOSIS — I272 Pulmonary hypertension, unspecified: Secondary | ICD-10-CM

## 2016-05-10 DIAGNOSIS — Z7902 Long term (current) use of antithrombotics/antiplatelets: Secondary | ICD-10-CM | POA: Diagnosis not present

## 2016-05-10 DIAGNOSIS — Z7901 Long term (current) use of anticoagulants: Secondary | ICD-10-CM | POA: Insufficient documentation

## 2016-05-10 DIAGNOSIS — Z86718 Personal history of other venous thrombosis and embolism: Secondary | ICD-10-CM | POA: Insufficient documentation

## 2016-05-10 DIAGNOSIS — Z79891 Long term (current) use of opiate analgesic: Secondary | ICD-10-CM | POA: Insufficient documentation

## 2016-05-10 DIAGNOSIS — Z86711 Personal history of pulmonary embolism: Secondary | ICD-10-CM

## 2016-05-10 DIAGNOSIS — I10 Essential (primary) hypertension: Secondary | ICD-10-CM | POA: Insufficient documentation

## 2016-05-10 DIAGNOSIS — Z96641 Presence of right artificial hip joint: Secondary | ICD-10-CM

## 2016-05-10 IMAGING — CT CT ANGIO CHEST
3 of 8 series · 18 of 36 positions shown · IV contrast (OMNIPAQUE)
Comparison: 06/02/2013 and prior CTs

CLINICAL DATA: 33-year-old male with shortness of breath, hypoxia
and sickle cell disease.

EXAM:
CT ANGIOGRAPHY CHEST WITH CONTRAST
TECHNIQUE: Multidetector CT imaging of the chest was performed using the
standard protocol during bolus administration of intravenous
contrast. Multiplanar CT image reconstructions and MIPs were
obtained to evaluate the vascular anatomy.
CONTRAST:  100mL OMNIPAQUE IOHEXOL 350 MG/ML SOLN

[Series 7: pe thins @ 1mm · axial · 0.74mm/px · z∈[-259,-22]mm · 9 of 297 slices shown (1 of 2)]
[im 30/297  lung]
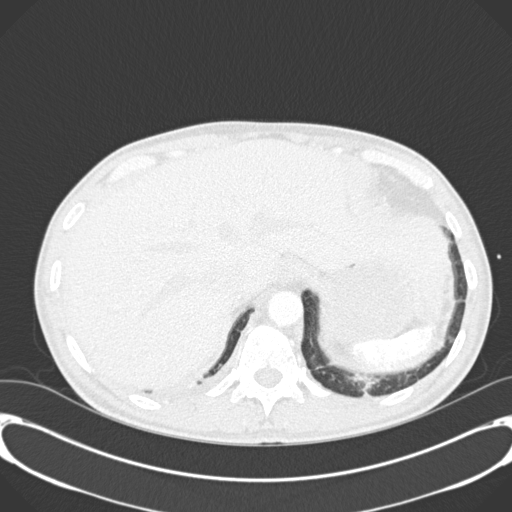
[im 60/297  mediastinal]
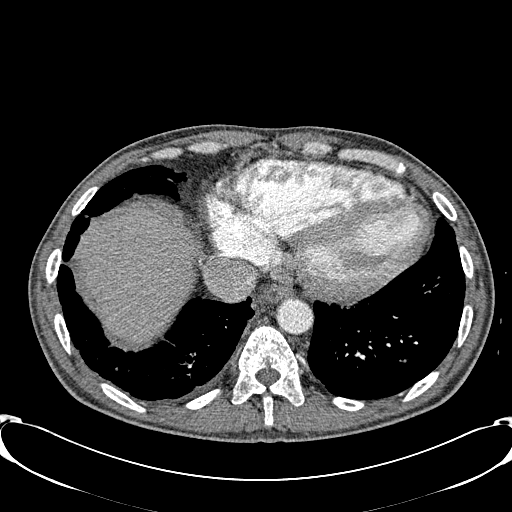
[im 89/297  lung]
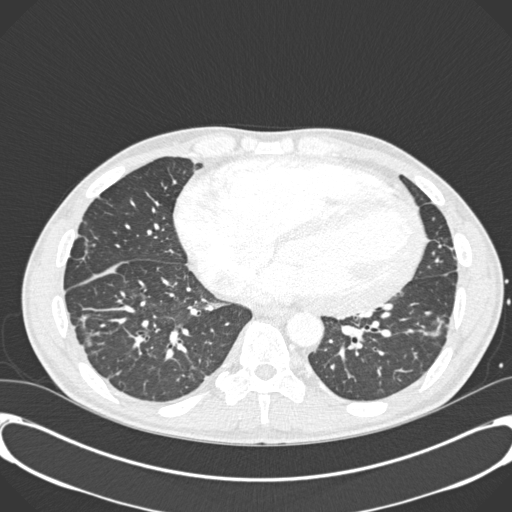
[im 119/297  mediastinal]
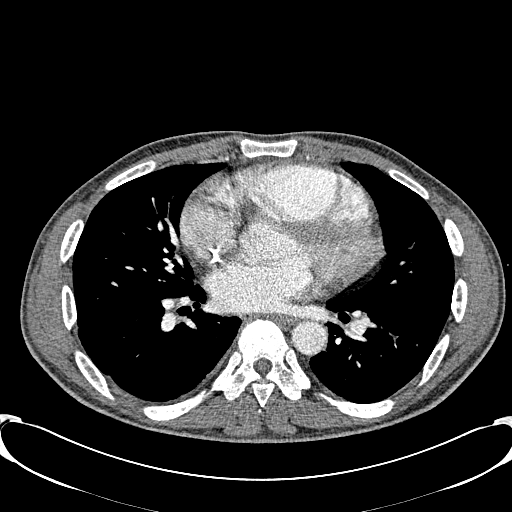
[im 149/297  lung]
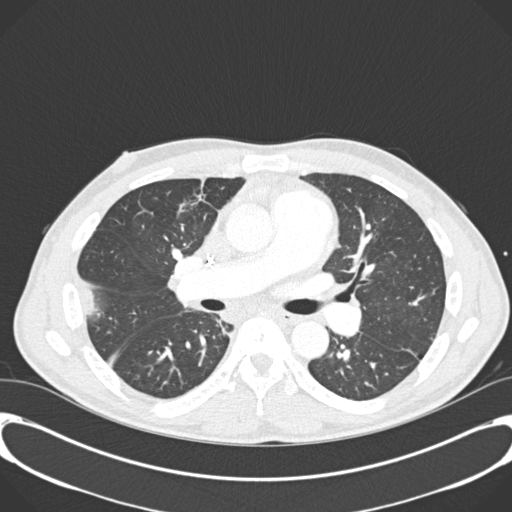
[im 178/297  mediastinal]
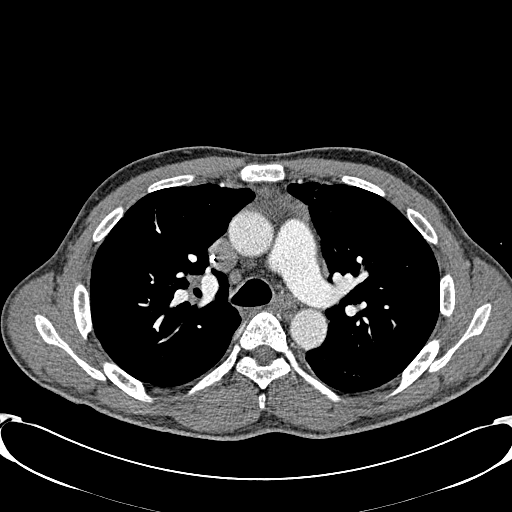
[im 208/297  lung]
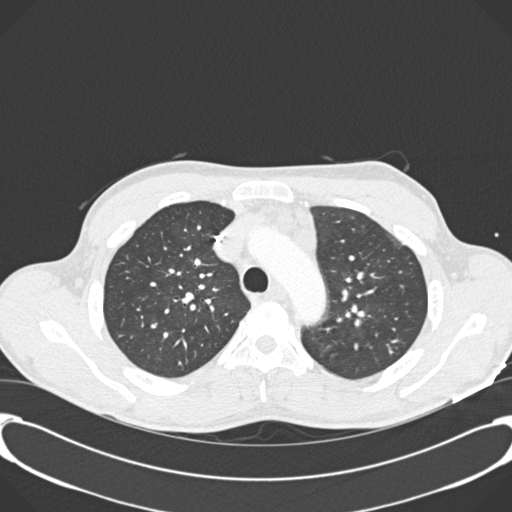
[im 237/297  mediastinal]
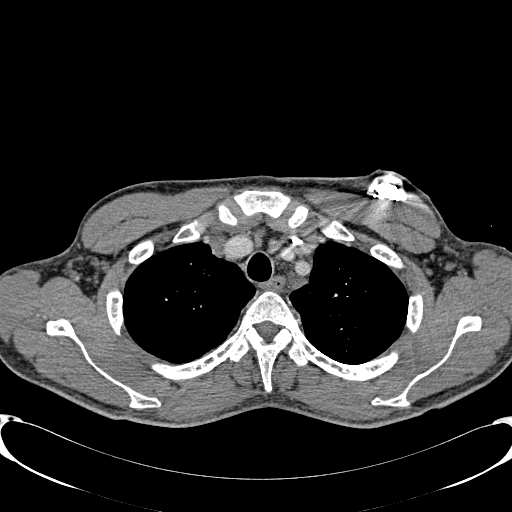
[im 267/297  lung]
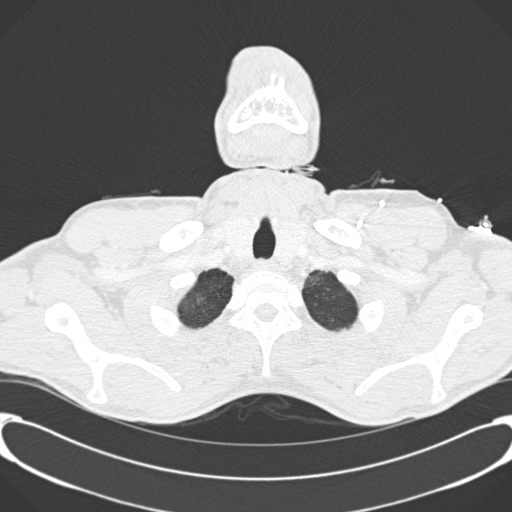

[Series 14: pe thins @ 1mm · axial · 0.74mm/px · z∈[-118,+87]mm · 8 of 265 slices shown (2 of 2)]
[im 30/265  lung]
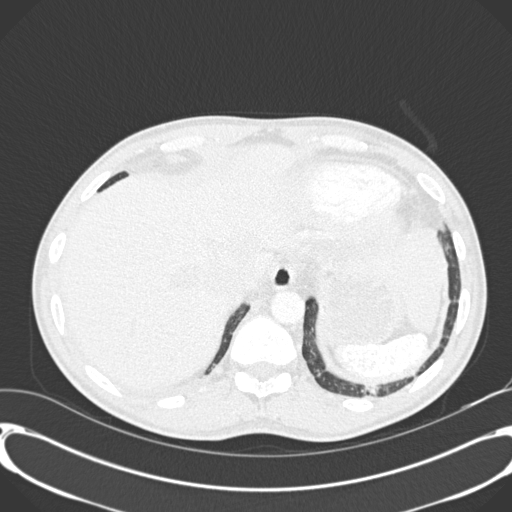
[im 59/265  lung]
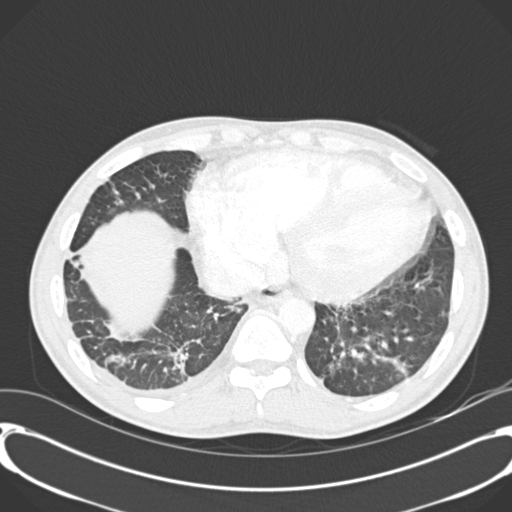
[im 89/265  lung]
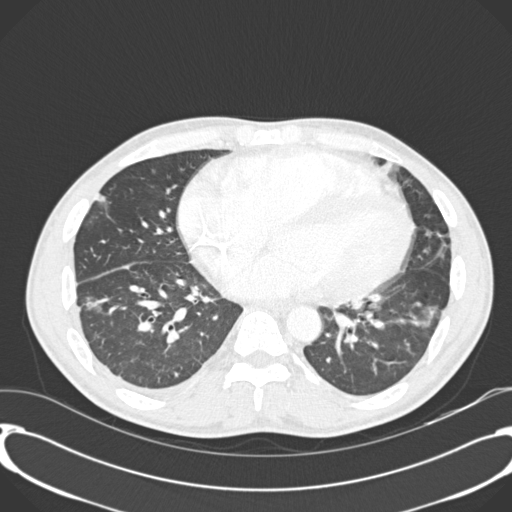
[im 118/265  lung]
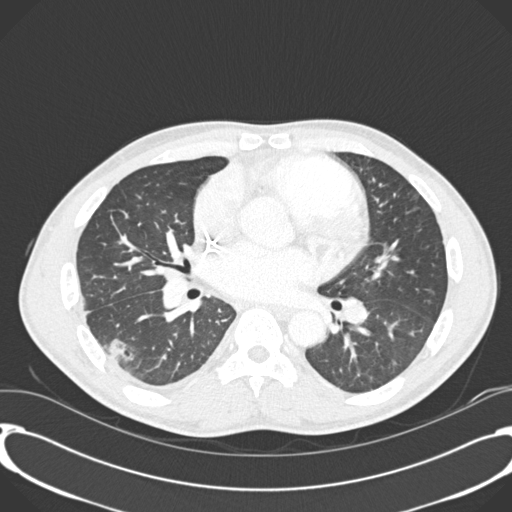
[im 147/265  lung]
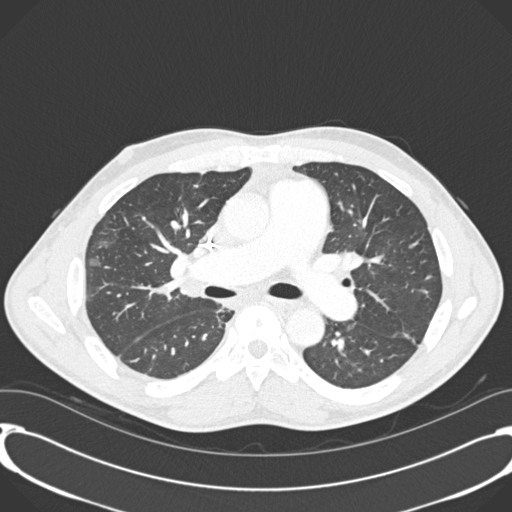
[im 177/265  lung]
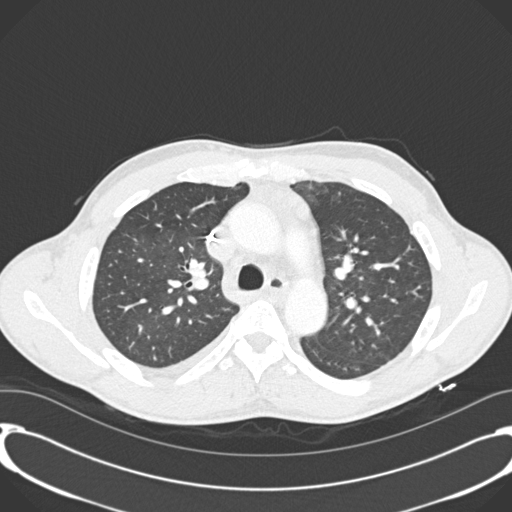
[im 206/265  lung]
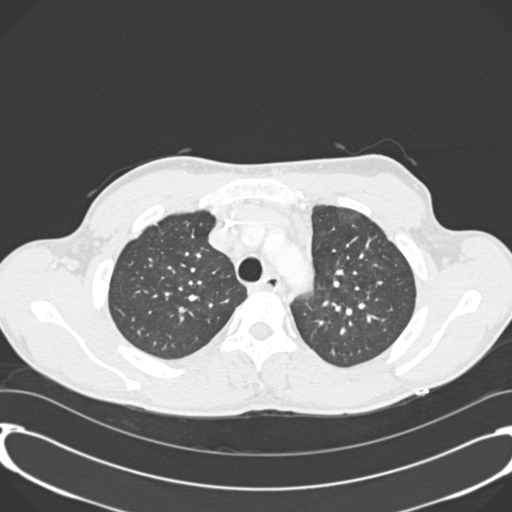
[im 235/265  lung]
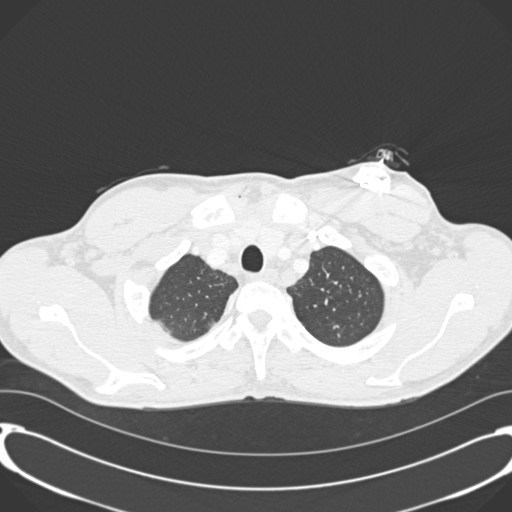

[Series 602: coronal mpr · coronal · 0.74mm/px · 1 of 106 slices shown]
[im 53/106  mediastinal]
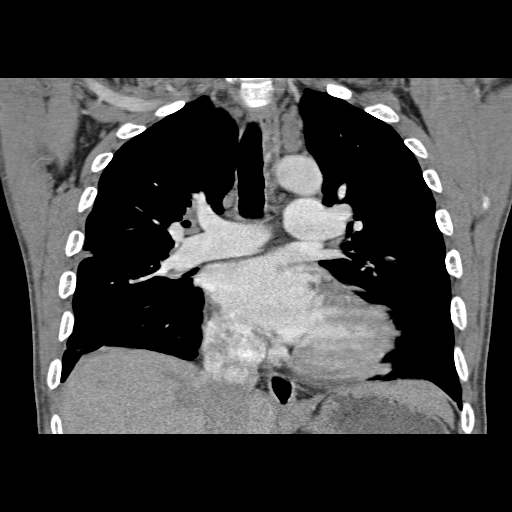

[18 of 36 positions shown; findings below may reference images not displayed]

FINDINGS: This is a technically satisfactory study.

No pulmonary emboli are identified.

Cardiomegaly is again noted.

There is no evidence of thoracic aortic aneurysm or definite
dissection.

Multiple shotty and mildly prominent mediastinal lymph nodes are
unchanged.

A miniscule right pleural effusion is now noted.

A 1.4 x 1.9 cm right lower lobe peripheral area of focal
consolidation is again noted.

A 0.8 x 1.8 cm inferior right upper lobe peripheral
consolidation/airspace opacity is now identified.

He other faint areas of peripheral ground-glass opacity within the
right middle lobe, lingula and right upper lobe are identified.

Mild bibasilar atelectasis/scarring is present.

There is no evidence of pneumothorax.

A calcified auto infarcted infarcted spleen is present.

Bony changes of sickle cell disease are noted including bilateral
humeral head AVN.

A left-sided Port-A-Cath is present.

Review of the MIP images confirms the above findings.
IMPRESSION: No evidence of pulmonary emboli or thoracic aortic aneurysm.

Nonspecific peripheral areas of ground-glass/airspace opacity and
focal consolidation as described above. This may represent
pneumonia, atelectasis or acute chest syndrome changes. The right
lower lobe area of focal consolidation is stable to minimally
increased.

New miniscule right pleural effusion.

Cardiomegaly with splenic and bony changes of sickle cell disease.

## 2016-05-10 IMAGING — CR DG CHEST 2V
2 series · 2 of 2 positions shown · non-contrast
Comparison: 06/02/2013

CLINICAL DATA: Pain, sickle cell crisis

EXAM:
CHEST  2 VIEW

[w chest pa]
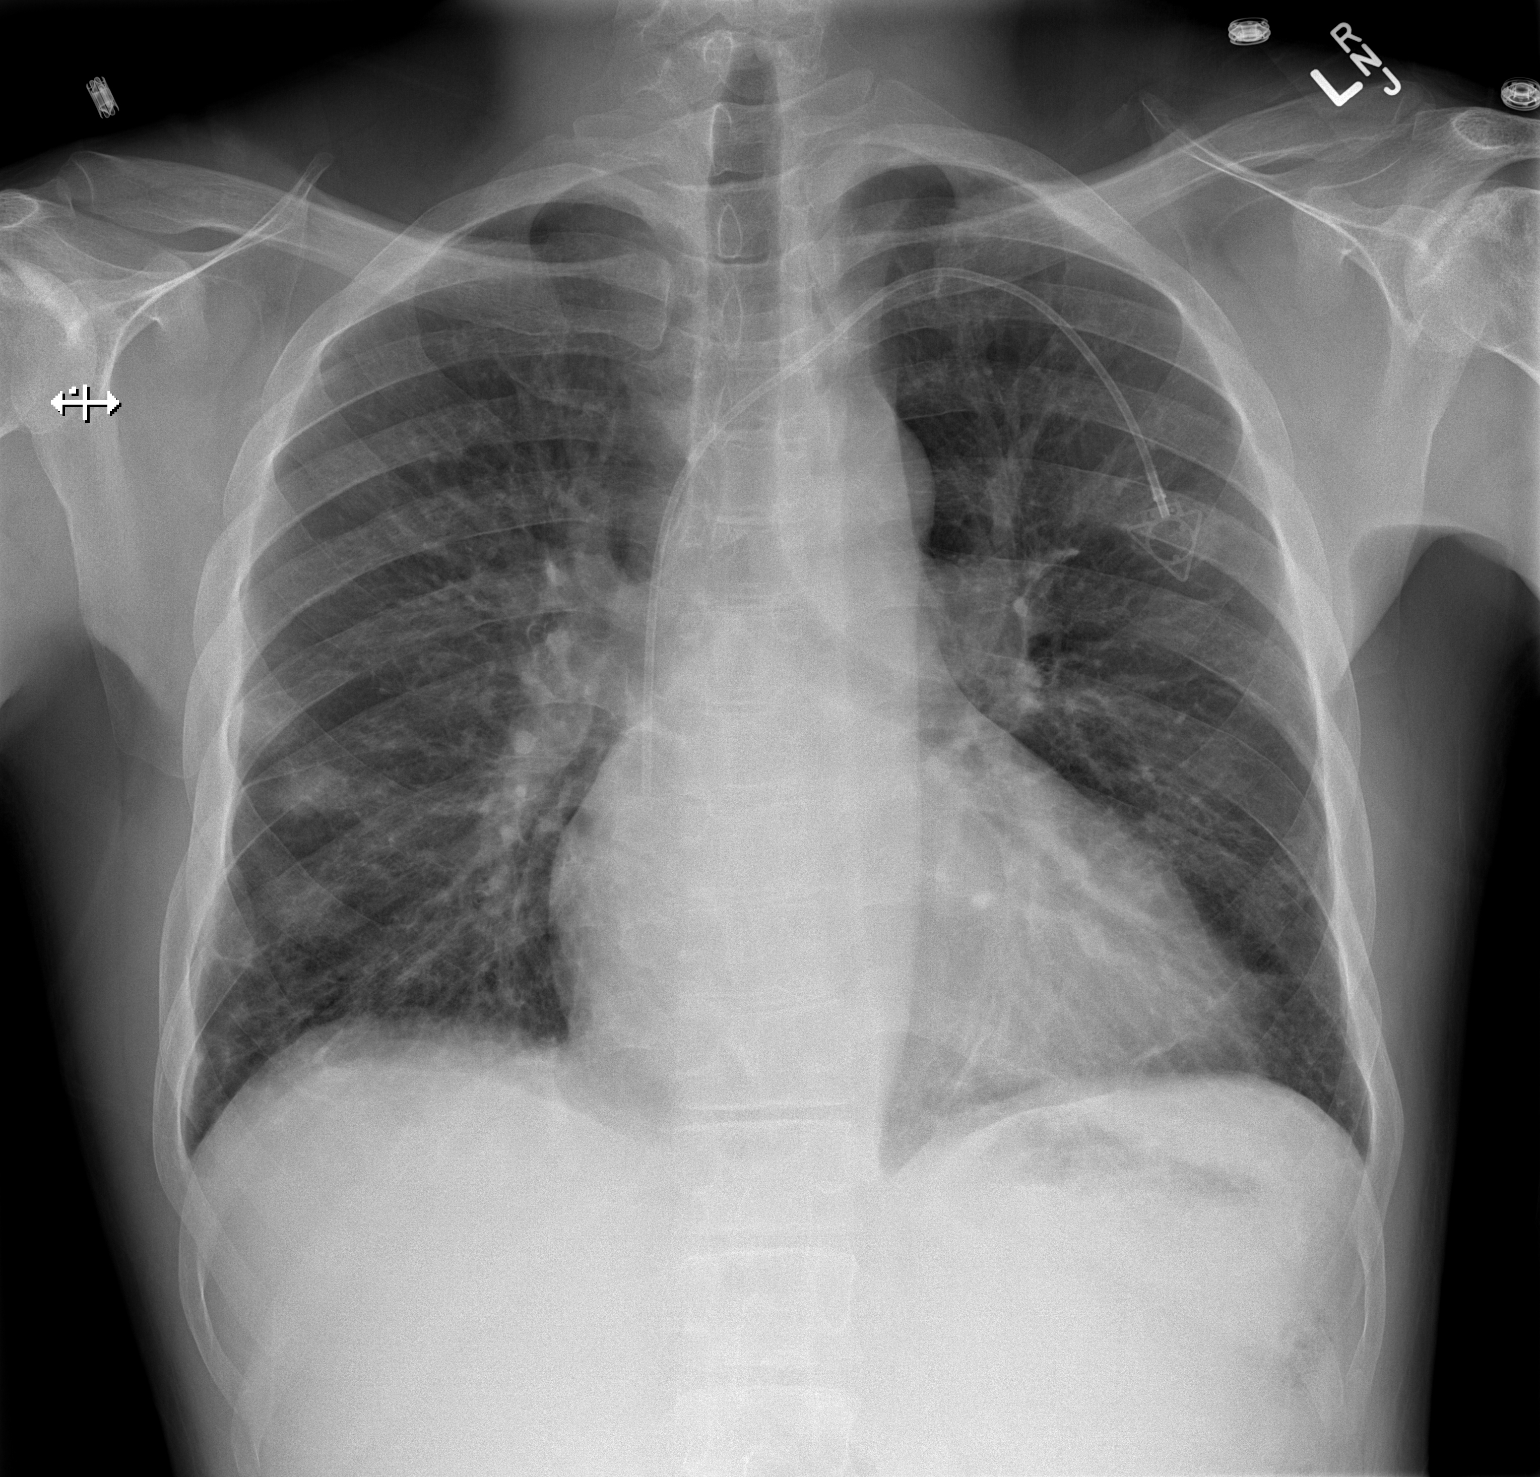

[w chest lat]
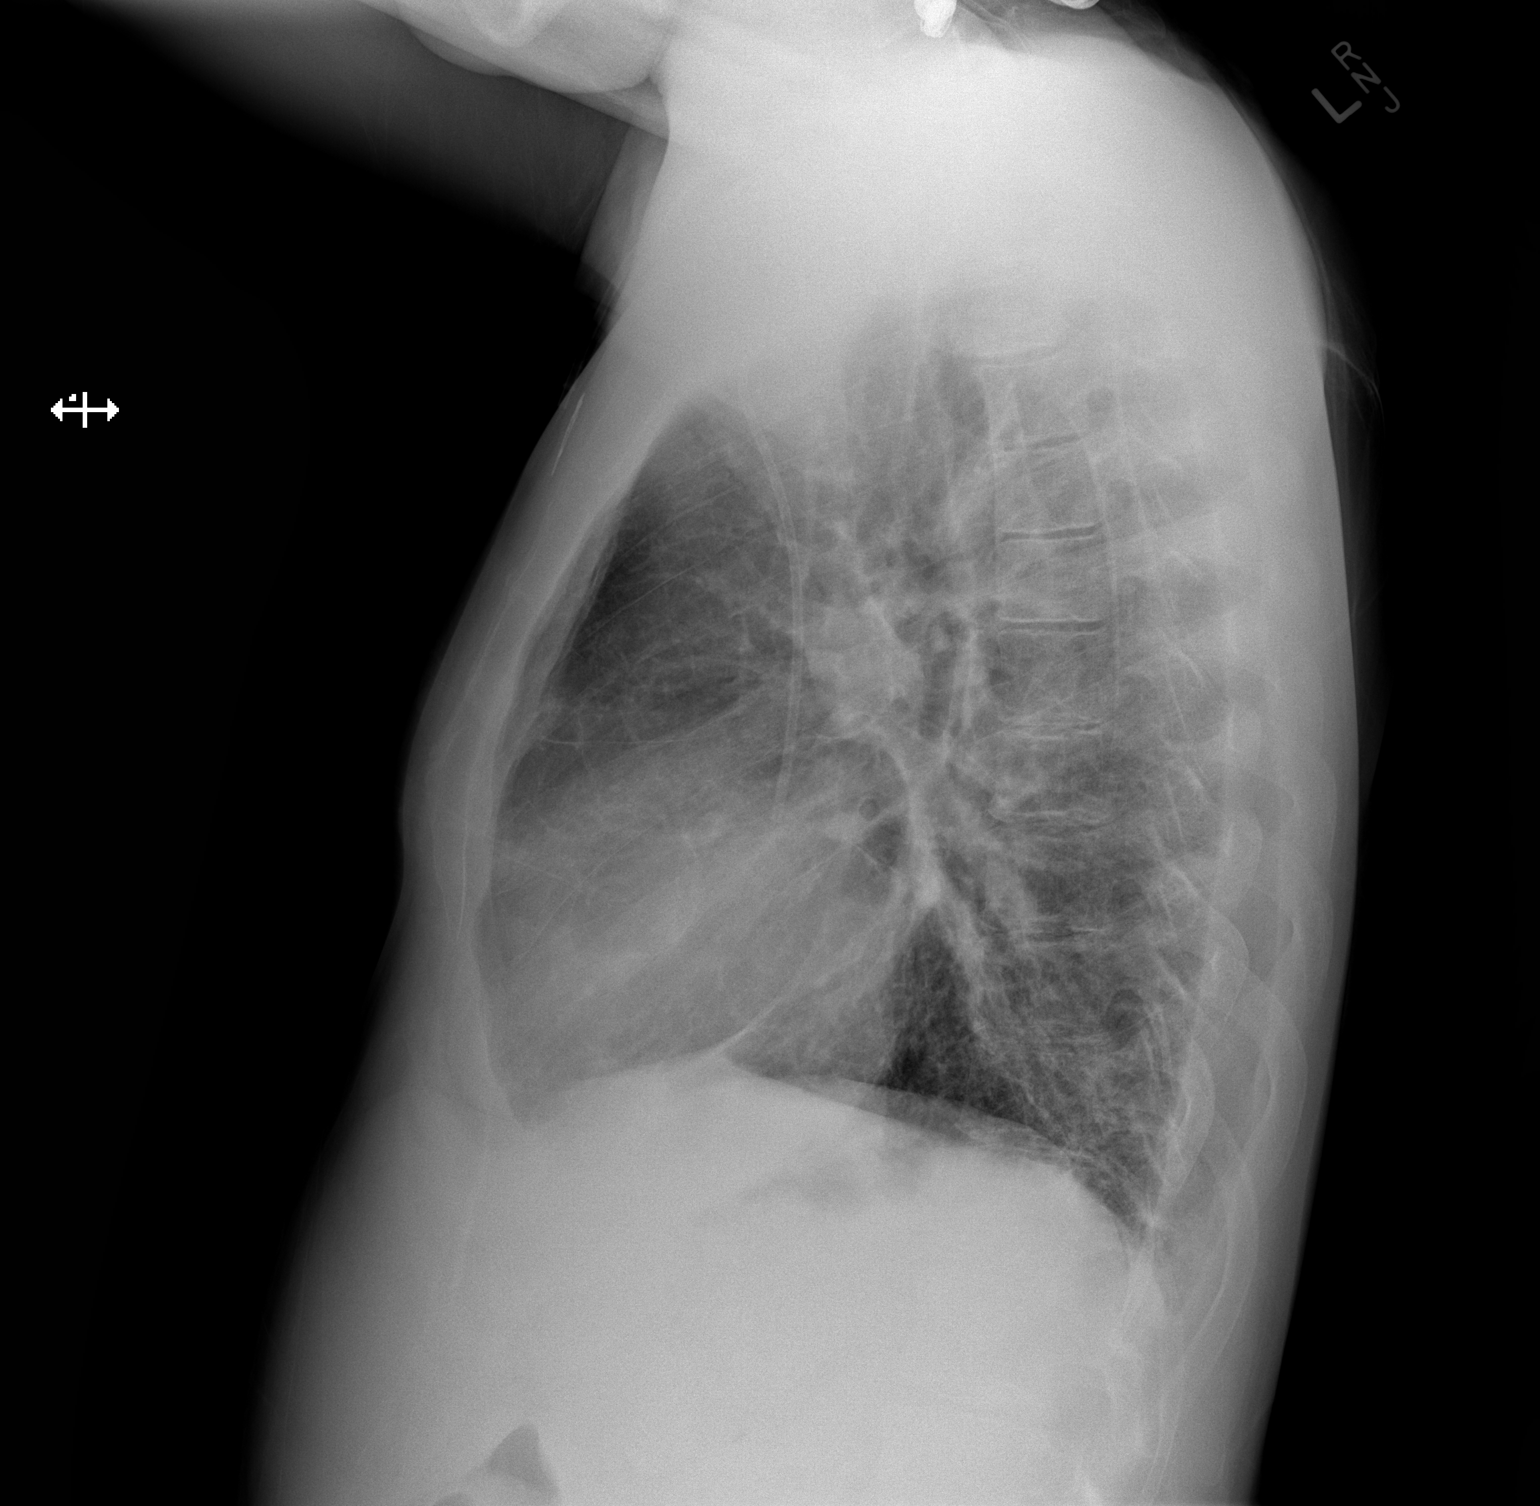

[2 of 2 positions shown; findings below may reference images not displayed]

FINDINGS: Cardiac shadow is stable. A left chest wall port is again seen in
satisfactory position. Patchy nodular changes are noted particularly
in the right lung base similar to that seen on recent CT
examination. No new focal infiltrate or sizable effusion is noted.
IMPRESSION: Stable right basilar parenchymal nodules.

## 2016-05-10 MED ORDER — NALOXONE HCL 0.4 MG/ML IJ SOLN: 0.4000 mg | INTRAMUSCULAR | Status: DC | PRN

## 2016-05-10 MED ORDER — DEXTROSE-NACL 5-0.45 % IV SOLN
INTRAVENOUS | Status: DC
  Administered 2016-05-10: 12:00:00 via INTRAVENOUS

## 2016-05-10 MED ORDER — HEPARIN SOD (PORK) LOCK FLUSH 100 UNIT/ML IV SOLN
500.0000 [IU] | INTRAVENOUS | Status: AC | PRN
  Administered 2016-05-10: 500 [IU]
  Filled 2016-05-10: qty 5

## 2016-05-10 MED ORDER — HYDROMORPHONE 1 MG/ML IV SOLN
INTRAVENOUS | Status: DC
  Administered 2016-05-10: 12.6 mg via INTRAVENOUS
  Administered 2016-05-10: 12:00:00 via INTRAVENOUS
  Filled 2016-05-10: qty 25

## 2016-05-10 MED ORDER — SODIUM CHLORIDE 0.9% FLUSH: 9.0000 mL | INTRAVENOUS | Status: DC | PRN

## 2016-05-10 MED ORDER — SODIUM CHLORIDE 0.9% FLUSH
10.0000 mL | INTRAVENOUS | Status: AC | PRN
  Administered 2016-05-10: 10 mL

## 2016-05-10 NOTE — ED Notes (Signed)
Sickle cell clinic RN picked up patient

## 2016-05-10 NOTE — ED Notes (Signed)
Bed: WA02 Expected date:  Expected time:  Means of arrival:  Comments: 

## 2016-05-10 NOTE — Progress Notes (Addendum)
Patient received to the [facility] from the ER c/o generalized pain. Patient rates pain 9/10 on pain scale. Patient was placed on a Dilaudid PCA and given IV fluids. At time of discharge patient reports a pain score of 5/10. Dicharge instructions given to patient and patient states an understanding. Patient alert, oriented, and ambulatory at time of discharge.

## 2016-05-10 NOTE — Discharge Summary (Signed)
[facility] Discharge Summary   Patient ID: [name] MRN: [id number] DOB/AGE: 37/15/1982 37 y.o.  Admit date: 05/10/2016 Discharge date: 05/10/2016  Primary Care Physician:  [name], MD  Admission Diagnoses:  Active Problems:   Sickle cell pain crisis [facility])  Discharge Medications:  Allergies as of 05/10/2016   No Known Allergies     Medication List    TAKE these medications   ambrisentan 5 MG tablet Commonly known as:  LETAIRIS Take 5 mg by mouth daily.   aspirin 81 MG chewable tablet Chew 1 tablet (81 mg total) by mouth daily.   carvedilol 3.125 MG tablet Commonly known as:  COREG Take 1 tablet (3.125 mg total) by mouth 2 (two) times daily with a meal.   cholecalciferol 1000 units tablet Commonly known as:  VITAMIN D Take 2,000 Units by mouth daily.   folic acid 1 MG tablet Commonly known as:  FOLVITE Take 2 tablets (2 mg total) by mouth daily.   furosemide 20 MG tablet Commonly known as:  LASIX Take 1 tablet (20 mg total) by mouth 2 (two) times daily.   gabapentin 300 MG capsule Commonly known as:  NEURONTIN Take 1 capsule (300 mg total) by mouth 3 (three) times daily.   HYDROmorphone 4 MG tablet Commonly known as:  DILAUDID Take 2 tablets (8 mg total) by mouth every 4 (four) hours as needed for moderate pain or severe pain.   hydroxyurea 500 MG capsule Commonly known as:  HYDREA Take 1000 mg QOD and alternate with 500 mg QOD. May take with food to minimize GI side effects. What changed:  how much to take  how to take this  when to take this  additional instructions   L-glutamine 5 g Pack Powder Packet Commonly known as:  ENDARI Take 15 g by mouth 2 (two) times daily.   morphine 30 MG 12 hr tablet Commonly known as:  MS CONTIN Take 1 tablet (30 mg total) by mouth every 12 (twelve) hours.   potassium chloride SA 20 MEQ tablet Commonly known as:  K-DUR,KLOR-CON Take 2 tablets (40 mEq total) by mouth daily.    rivaroxaban 20 MG Tabs tablet Commonly known as:  XARELTO Take 1 tablet (20 mg total) by mouth at bedtime.   torsemide 20 MG tablet Commonly known as:  DEMADEX Take 1 tablet (20 mg total) by mouth daily.   zolpidem 10 MG tablet Commonly known as:  AMBIEN Take 1 tablet (10 mg total) by mouth at bedtime as needed for sleep.        Consults:  None  Significant Diagnostic Studies:  Dg Chest 2 View  Result Date: 04/29/2016 CLINICAL DATA:  Acute onset of central chest throbbing and rib pain. Initial encounter. EXAM: CHEST  2 VIEW COMPARISON:  Chest radiograph performed 04/22/2016 FINDINGS: The lungs are well-aerated. Mild vascular congestion is noted. There is no evidence of focal opacification, pleural effusion or pneumothorax. The heart is enlarged. A left-sided chest port noted ending about the distal SVC. No acute osseous abnormalities are seen. Clips are noted within the right upper quadrant, reflecting prior cholecystectomy. IMPRESSION: Mild vascular congestion and cardiomegaly. Lungs remain grossly clear. Electronically Signed   By: Garald Balding M.D.   On: 04/29/2016 19:53   Dg Chest 2 View  Result Date: 04/22/2016 CLINICAL DATA:  Sickle cell pain crisis, shortness of breath today. History of hypertension. EXAM: CHEST  2 VIEW COMPARISON:  Chest x-rays dated 04/17/2016 and 02/11/2016. FINDINGS: Cardiomegaly is stable.  There is central pulmonary vascular congestion. Prominent interstitial markings again noted bilaterally suggesting associated interstitial edema. No evidence of overt alveolar pulmonary edema. No confluent opacity to suggest a developing pneumonia. No pleural effusion or pneumothorax seen. Left chest wall Port-A-Cath is stable in position with tip at the level of the lower SVC. No acute or suspicious osseous finding. IMPRESSION: Cardiomegaly with central pulmonary vascular congestion and bilateral interstitial edema suggesting volume overload/CHF. No evidence of pneumonia.  Electronically Signed   By: Franki Cabot M.D.   On: 04/22/2016 14:14   Dg Chest 2 View  Result Date: 04/13/2016 CLINICAL DATA:  History of sickle cell disease. Patient admitted 04/10/2016 with chest pain. Hypoxia and pulmonary edema. EXAM: CHEST  2 VIEW COMPARISON:  PA and lateral chest 04/09/2016 and 01/31/2016. Single-view of the chest 04/11/2016. FINDINGS: Marked cardiomegaly is again seen. There is mild interstitial edema which appears unchanged since yesterday's examination. No pneumothorax or pleural effusion. No consolidative process. Port-A-Cath is in place. Avascular necrosis left humeral head is noted. IMPRESSION: No change in mild interstitial edema in patient with marked cardiomegaly. Electronically Signed   By: Inge Rise M.D.   On: 04/13/2016 10:54   Ct Angio Chest Pe W Or Wo Contrast  Result Date: 05/06/2016 CLINICAL DATA:  Hypoxia, history of sickle cell EXAM: CT ANGIOGRAPHY CHEST WITH CONTRAST TECHNIQUE: Multidetector CT imaging of the chest was performed using the standard protocol during bolus administration of intravenous contrast. Multiplanar CT image reconstructions and MIPs were obtained to evaluate the vascular anatomy. CONTRAST:  75 cc of Isovue 370 IV COMPARISON:  CXR 04/29/2016, chest CT 03/13/2016 FINDINGS: Cardiovascular: Cardiomegaly. Port catheter tip in the cavoatrial junction. Small pericardial effusion. No acute pulmonary embolus. To the third order branches. No aortic aneurysm or dissection. Mediastinum/Nodes: Mild retrosternal extension of the thyroid gland without focal mass. Trachea and esophagus demonstrate no acute findings. Multiple small prevascular, paratracheal and hilar nodes are again seen without pathologic appearing enlargement. Lungs/Pleura: Perihilar distribution of ground-glass opacities suspicious for mild pulmonary edema or alveolitis secondary to infection or inflammation. No significant pleural effusion or pneumothorax. Upper Abdomen: No acute  abnormality in the upper abdomen. 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 Musculoskeletal: Central endplate compressions of several mid thoracic vertebrae consistent with history of sickle cell. No acute osseous appearing abnormality. Review of the MIP images confirms the above findings. IMPRESSION: Stable cardiomegaly with mild perihilar ground-glass opacities suspicious for pulmonary edema with differential possibilities including alveolitis possibly from an infectious or inflammatory process. No acute pulmonary embolus. Auto infarcted spleen. H- shaped vertebrae in the mid thoracic spine consistent with history of sickle cell. Electronically Signed   By: Ashley Royalty M.D.   On: 05/06/2016 19:11   Dg Chest Port 1 View  Result Date: 04/17/2016 CLINICAL DATA:  Lethargy.  Shortness of breath. EXAM: PORTABLE CHEST 1 VIEW COMPARISON:  None. FINDINGS: Stable enlarged cardiac silhouette. Increased density at the left lateral lung base. Interval mild patchy and linear opacity at the right lung base. Left subclavian porta catheter tip in the right atrium. No acute bony abnormality. 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 IMPRESSION: 1. Interval atelectasis or pneumonia at the left lateral lung base. 2. Interval mild atelectasis and possible pneumonia at the right lung base. 3. Stable cardiomegaly. 4. Left subclavian porta catheter tip in the right atrium. This could be retracted 4.5 cm to place it in the superior vena cava. Electronically Signed   By: Claudie Revering  M.D.   On: 04/17/2016 09:06   Dg Chest Port 1 View  Result Date: 04/11/2016 CLINICAL DATA:  Initial evaluation for sickle cell pain crisis. Worsened shortness of breath. EXAM: PORTABLE CHEST 1 VIEW COMPARISON:  Prior radiograph from 04/09/2016. FINDINGS: Left-sided Port-A-Cath in place, stable. Severe cardiomegaly not significantly changed. Mediastinal silhouette within normal limits. Lungs  mildly hypoinflated. There is slightly worsened pulmonary vascular congestion as compared to previous. Slightly worsened right infrahilar opacity, which may reflect developing infiltrate and/or atelectasis. More linear opacities at the bilateral lung bases most consistent with atelectasis. No pleural effusion. No pneumothorax. No acute osseous abnormality. IMPRESSION: 1. Severe cardiomegaly with slightly worsened diffuse pulmonary vascular congestion as compared to 04/09/16, which may reflect developing pulmonary edema. 2. Slightly patchy right infrahilar opacity, which may reflect congestion and/or developing infiltrate. 3. Mildly increased superimposed bibasilar atelectasis. Electronically Signed   By: Jeannine Boga M.D.   On: 04/11/2016 06:10     [facility] Course: Mr. Nathin Saran, a 36 year old male with a history of sickle cell anemia transitioned to the day infusion center from the emergency department for pain management. He was treated with minimal IV fluids due to history of pulmonary hypertension. Patient was started on high concentration PCA dilaudid. He used a total of 12.6 mg with 22 demands and 18 deliveries. Pain intensity decreased from 9/10 to 5/10.  Reviewed previous labs, hemoglobin was 6.8. Patient will return to clinic in 1 week for a CBC.   Discussed the importance of taking medications as prescribed.   The patient was given clear instructions to go to ER or return to medical center if symptoms do not improve, worsen or new problems develop. The patient verbalized understanding.   Physical Exam at Discharge:    BP 116/80 (BP Location: Right Arm)   Pulse 85   Temp 98.5 F (36.9 C) (Oral)   Resp 17   Ht 6' (1.829 m)   Wt 200 lb (90.7 kg)   SpO2 96%   BMI 27.12 kg/m   General Appearance:    Alert, cooperative, no distress, appears stated age  Head:    Normocephalic, without obvious abnormality, atraumatic  Eyes:    PERRL, conjunctiva/corneas  clear, EOM's intact, fundi    benign, Bilateral icterus     Ears:    Normal TM's and external ear canals, both ears  Back:     Symmetric, no curvature, ROM normal, no CVA tenderness  Lungs:     Clear to auscultation bilaterally, respirations unlabored  Chest wall:    No tenderness or deformity  Heart:    Regular rate and rhythm, S1 and S2 normal, no murmur, rub   or gallop  Abdomen:     Soft, Tender bowel sounds active all four quadrants,    no masses, no organomegaly  Extremities:   Extremities normal, atraumatic, no cyanosis or edema  Pulses:   2+ and symmetric all extremities  Skin:   Skin color, texture, turgor normal, no rashes or lesions  Lymph nodes:   Cervical, supraclavicular, and axillary nodes normal  Neurologic:   CNII-XII intact. Normal strength, sensation and reflexes      throughout    Disposition at Discharge: 01-Home or Self Care  Discharge Orders: Discharge Instructions    Discharge patient    Complete by:  As directed    Discharge disposition:  01-Home or Self Care   Discharge patient date:  05/10/2016      Condition at Discharge:  Stable  Time spent on Discharge:  15 minutes  Signed: Awanda Wilcock M 05/10/2016, 4:04 PM

## 2016-05-10 NOTE — ED Provider Notes (Signed)
[facility] DEPT Provider Note   CSN: [id number] Arrival date & time: 05/10/16  3212     History   Chief Complaint No chief complaint on file.   HPI [name] is a 36 y.o. male.  He presents for recurrence of his typical sickle cell crisis pain.  He states the pain is in both lower ribs, and both ankles.  He is using his usual home medications of MS Contin and Dilaudid, without relief.  He was discharged from the hospital, 2 days ago after a prolonged admission for sickle cell crisis with chest syndrome.  He had been off his diuretic at that time, but was restarted on it.  He states he is compliant with all of his prescribed medicines at this time.  He denies shortness of breath, fever, chills, nausea or vomiting, back pain, weakness or dizziness.  He states that he would be willing to go to the sickle cell clinic for treatment, if they would accept him.  There are no other known modifying factors.   HPI  Past Medical History:  Diagnosis Date  . Acute chest syndrome ([place]) 06/18/2013  . Acute embolism and thrombosis of right internal jugular vein ([place])   . Alcohol consumption of one to four drinks per day   . Avascular necrosis (HCC)    Right Hip  . Blood transfusion   . Chronic anticoagulation   . Demand ischemia ([place]) 01/02/2014  . Former smoker   . Functional asplenia   . Hb-SS disease with crisis ([place])   . History of Clostridium difficile infection   . History of pulmonary embolus (PE)   . Hypertension   . Hypokalemia   . Leukocytosis    Chronic  . Mood disorder ([place])   . Noncompliance with medication regimen   . Oxygen deficiency   . Pulmonary hypertension   . Second hand tobacco smoke exposure   . Sickle cell anemia (HCC)   . Sickle-cell crisis with associated acute chest syndrome ([place]) 05/13/2013  . Stroke ([place])   . Thrombocytosis (HCC)    Chronic  . Uses marijuana     Patient Active Problem List   Diagnosis Date Noted  . Acute pulmonary edema  ([place]) 04/11/2016  . H/O arterial ischemic stroke 11/11/2015  . Slurred speech 11/10/2015  . Chronic anemia   . Hb-SS disease without crisis ([place]) 10/07/2014  . Acute on chronic respiratory failure with hypoxia ([place])   . Prolonged Q-T interval on ECG 09/10/2014  . Troponin level elevated 08/29/2014  . Cor pulmonale, chronic ([place]) 08/25/2014  . Sickle cell anemia ([place]) 06/25/2014  . Anemia of chronic disease 06/25/2014  . PAH (pulmonary artery hypertension) 03/18/2014  . Chronic respiratory failure with hypoxia ([place]) 03/14/2014  . Paralytic strabismus, external ophthalmoplegia   . Chronic pain syndrome 12/12/2013  . Chronic anticoagulation 08/22/2013  . Essential hypertension 08/22/2013  . Pulmonary HTN 06/18/2013  . Functional asplenia   . Vitamin D deficiency 02/13/2013  . Sickle cell crisis ([place]) 01/13/2013  . Rib pain 01/13/2013  . Hx of pulmonary embolus 06/29/2012  . 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 Avascular necrosis ([place])   . Sickle cell pain crisis [facility])     Past Surgical History:  Procedure Laterality Date  . CHOLECYSTECTOMY     01/2008  . Excision of left periauricular cyst     10/2009  . Excision of right ear lobe cyst with primary closur     11/2007  . Porta cath placement    .  Porta cath removal    . PORTACATH PLACEMENT  01/05/2012   Procedure: INSERTION PORT-A-CATH;  Surgeon: Odis Hollingshead, MD;  Location: [facility];  Service: General;  Laterality: N/A;  ultrasound guiced port a cath insertion with fluoroscopy  . Right hip replacement     08/2006  . UMBILICAL HERNIA REPAIR     01/2008       Home Medications    Prior to Admission medications   Medication Sig Start Date End Date Taking? Authorizing Provider  ambrisentan (LETAIRIS) 5 MG tablet Take 5 mg by mouth daily.     Historical Provider, MD  aspirin 81 MG chewable tablet Chew 1 tablet (81 mg total) by mouth daily. 07/23/14   [name], MD  carvedilol (COREG) 3.125 MG tablet Take 1  tablet (3.125 mg total) by mouth 2 (two) times daily with a meal. 03/17/16   [name], MD  cholecalciferol (VITAMIN D) 1000 units tablet Take 2,000 Units by mouth daily.    Historical Provider, MD  folic acid (FOLVITE) 1 MG tablet Take 2 tablets (2 mg total) by mouth daily. 04/25/16   [name], MD  furosemide (LASIX) 20 MG tablet Take 1 tablet (20 mg total) by mouth 2 (two) times daily. 05/08/16 05/11/16  [name], MD  gabapentin (NEURONTIN) 300 MG capsule Take 1 capsule (300 mg total) by mouth 3 (three) times daily. 06/01/15   Tresa Garter, MD  HYDROmorphone (DILAUDID) 4 MG tablet Take 2 tablets (8 mg total) by mouth every 4 (four) hours as needed for moderate pain or severe pain. 05/06/16 05/21/16  Tresa Garter, MD  hydroxyurea (HYDREA) 500 MG capsule Take 1000 mg QOD and alternate with 500 mg QOD. May take with food to minimize GI side effects. Patient taking differently: Take 500-1,000 mg by mouth See admin instructions. Take 1000 mg QOD and alternate with 500 mg QOD. May take with food to minimize GI side effects. 04/25/16   [name], MD  L-glutamine (ENDARI) 5 g PACK Powder Packet Take 15 g by mouth 2 (two) times daily. 04/25/16   [name], MD  morphine (MS CONTIN) 30 MG 12 hr tablet Take 1 tablet (30 mg total) by mouth every 12 (twelve) hours. 05/06/16 06/05/16  Tresa Garter, MD  potassium chloride SA (K-DUR,KLOR-CON) 20 MEQ tablet Take 2 tablets (40 mEq total) by mouth daily. 04/27/16   [name], MD  rivaroxaban (XARELTO) 20 MG TABS tablet Take 1 tablet (20 mg total) by mouth at bedtime. 12/29/15   Tresa Garter, MD  torsemide (DEMADEX) 20 MG tablet Take 1 tablet (20 mg total) by mouth daily. 05/08/16   [name], MD  zolpidem (AMBIEN) 10 MG tablet Take 1 tablet (10 mg total) by mouth at bedtime as needed for sleep. 05/06/16 06/05/16  Tresa Garter, MD    Family History Family History  Problem Relation Age  of Onset  . Sickle cell trait Mother   . Depression Mother   . Diabetes Mother   . Sickle cell trait Father   . Sickle cell trait Brother     Social History Social History  Substance Use Topics  . Smoking status: Former Smoker    Packs/day: 0.50    Years: 10.00    Types: Cigarettes    Quit date: 05/29/2011  . Smokeless tobacco: Never Used  . Alcohol use No     Allergies   Patient has no known allergies.  Review of Systems Review of Systems  All other systems reviewed and are negative.    Physical Exam Updated Vital Signs BP 126/87 (BP Location: Right Arm)   Pulse 81   Temp 98.8 F (37.1 C) (Oral)   Resp 18   Ht 6' (1.829 m)   Wt 199 lb (90.3 kg)   SpO2 92%   BMI 26.99 kg/m    Physical Exam  Constitutional: He is oriented to person, place, and time. He appears well-developed and well-nourished. No distress.  HENT:  Head: Normocephalic and atraumatic.  Right Ear: External ear normal.  Left Ear: External ear normal.  Eyes: Conjunctivae and EOM are normal. Pupils are equal, round, and reactive to light.  Neck: Normal range of motion and phonation normal. Neck supple.  Cardiovascular: Normal rate, regular rhythm and normal heart sounds.   Pulmonary/Chest: Effort normal and breath sounds normal. No respiratory distress. He exhibits no tenderness and no bony tenderness.  Chest is nontender to palpation  Abdominal: Soft. There is no tenderness.  Musculoskeletal: Normal range of motion. He exhibits no edema or deformity.  Ankles are nontender to palpation  Neurological: He is alert and oriented to person, place, and time. No cranial nerve deficit or sensory deficit. He exhibits normal muscle tone. Coordination normal.  Skin: Skin is warm, dry and intact.  Psychiatric: He has a normal mood and affect. His behavior is normal. Judgment and thought content normal.  Nursing note and vitals reviewed.    ED Treatments / Results  Labs (all labs ordered are listed,  but only abnormal results are displayed) Labs Reviewed - No data to display  EKG  EKG Interpretation  Date/Time:  Tuesday May 10 2016 10:00:04 EDT Ventricular Rate:  79 PR Interval:    QRS Duration: 125 QT Interval:  425 QTC Calculation: 488 R Axis:   -101 Text Interpretation:  Sinus rhythm Probable left atrial enlargement Nonspecific IVCD with LAD Inferior infarct, old Anterolateral infarct, age indeterminate since last tracing no significant change Confirmed by Eulis Foster  MD, Laveda Demedeiros ([id number]) on 05/10/2016 10:16:56 AM       Radiology No results found.  Procedures Procedures (including critical care time)  Medications Ordered in ED Medications - No data to display   Initial Impression / Assessment and Plan / ED Course  I have reviewed the triage vital signs and the nursing notes.  Pertinent labs & imaging results that were available during my care of the patient were reviewed by me and considered in my medical decision making (see chart for details).  Clinical Course as of May 10 1021  Tue May 10, 2016  0950 Patient is on chronic oxygen therapy, did not present with oxygen tank, and has not yet been placed on oxygen, here in the emergency department.  [EW]  1001 Initial oxygen saturation was low however he had been off his usual chronic oxygenation therapy, for 2 hours.  [EW]  1008 Oxygen saturation rapidly improved to 93% on 4 L nasal cannula, his chronic treatment.  Will contact sickle cell clinic for admission to their service.  [EW]  1014 Case discussed with Dr. Doreene Burke, who accepts him to the [facility], for treatment.  [EW]    Clinical Course User Index [EW] Daleen Bo, MD    Medications - No data to display  Patient Vitals for the past 24 hrs:  BP Temp Temp src Pulse Resp SpO2 Height Weight  05/10/16 1000 - - - - - 92 % - -  05/10/16 2440  126/87 98.8 F (37.1 C) Oral 81 18 (!) 83 % - -  05/10/16 0911 - - - - - - 6' (1.829 m) 199 lb (90.3 kg)    10:18 AM  Reevaluation with update and discussion. After initial assessment and treatment, an updated evaluation reveals he remains comfortable in appearance.  Findings discussed with the patient and all questions were answered. Mychal Decarlo L    Final Clinical Impressions(s) / ED Diagnoses   Final diagnoses:  Sickle cell crisis (HCC)   Chronic pain, with sickle cell anemia, clinically stable at this time.Patient transferred to the sickle cell clinic for further evaluation and treatment in the outpatient setting.  Nursing Notes Reviewed/ Care Coordinated Applicable Imaging Reviewed Interpretation of Laboratory Data incorporated into ED treatment  The patient appears reasonably screened and/or stabilized for discharge and I doubt any other medical condition or other [facility] requiring further screening, evaluation, or treatment in the ED at this time prior to discharge.  Plan: Home Medications-continue usual; Home Treatments-rest, fluids; return here if the recommended treatment, does not improve the symptoms; Recommended follow up-CBC, as needed   New Prescriptions New Prescriptions   No medications on file    Daleen Bo, MD 05/10/16 1024

## 2016-05-10 NOTE — H&P (Signed)
[facility] History and Physical   Date: 05/10/2016  Patient name: [name] Medical record number: [id number] Date of birth: February 19, 1980 Age: 36 y.o. Gender: male PCP: Gerald Chessman, MD  Attending physician: Gerald Garter, MD  Chief Complaint: Generalized pain  History of Present Illness: [name], a 36 year old male with a history of sickle cell anemia, chronic anticoagulation therapy, and pulmonary hypertension presents complaining of generalized pain. Patient states that pain started increasing on last night and has been progressively worsening. Patient was evaluated in the emergency department this am and transitioned to the day infusion center for further observation and pain management. [name] pain intensity is 9/10 described as constant and throbbing. He says that he had been taking home medications consistently prior to reporting to the emergency department.  He denies headache, shortness of breath, chest pain, dysuria, nausea, vomiting, and diarrhea.   Meds: Prescriptions Prior to Admission  Medication Sig Dispense Refill Last Dose  . ambrisentan (LETAIRIS) 5 MG tablet Take 5 mg by mouth daily.    04/29/2016 at Unknown time  . aspirin 81 MG chewable tablet Chew 1 tablet (81 mg total) by mouth daily. 30 tablet 11 04/29/2016 at Unknown time  . carvedilol (COREG) 3.125 MG tablet Take 1 tablet (3.125 mg total) by mouth 2 (two) times daily with a meal. 30 tablet 1 04/29/2016 at 0900  . cholecalciferol (VITAMIN D) 1000 units tablet Take 2,000 Units by mouth daily.   04/29/2016 at Unknown time  . folic acid (FOLVITE) 1 MG tablet Take 2 tablets (2 mg total) by mouth daily.   04/29/2016 at Unknown time  . furosemide (LASIX) 20 MG tablet Take 1 tablet (20 mg total) by mouth 2 (two) times daily. 6 tablet 0   . gabapentin (NEURONTIN) 300 MG capsule Take 1 capsule (300 mg total) by mouth 3 (three) times daily. 270 capsule 3 04/29/2016 at Unknown time   . HYDROmorphone (DILAUDID) 4 MG tablet Take 2 tablets (8 mg total) by mouth every 4 (four) hours as needed for moderate pain or severe pain. 90 tablet 0   . hydroxyurea (HYDREA) 500 MG capsule Take 1000 mg QOD and alternate with 500 mg QOD. May take with food to minimize GI side effects. (Patient taking differently: Take 500-1,000 mg by mouth See admin instructions. Take 1000 mg QOD and alternate with 500 mg QOD. May take with food to minimize GI side effects.) 45 capsule  04/29/2016 at Unknown time  . L-glutamine (ENDARI) 5 g PACK Powder Packet Take 15 g by mouth 2 (two) times daily. 180 packet 1 04/29/2016 at Unknown time  . morphine (MS CONTIN) 30 MG 12 hr tablet Take 1 tablet (30 mg total) by mouth every 12 (twelve) hours. 60 tablet 0   . potassium chloride SA (K-DUR,KLOR-CON) 20 MEQ tablet Take 2 tablets (40 mEq total) by mouth daily. 30 tablet 2 04/29/2016 at Unknown time  . rivaroxaban (XARELTO) 20 MG TABS tablet Take 1 tablet (20 mg total) by mouth at bedtime. 30 tablet 3 04/29/2016 at 0900  . torsemide (DEMADEX) 20 MG tablet Take 1 tablet (20 mg total) by mouth daily. 30 tablet 1   . zolpidem (AMBIEN) 10 MG tablet Take 1 tablet (10 mg total) by mouth at bedtime as needed for sleep. 30 tablet 0     Allergies: Patient has no known allergies. Past Medical History:  Diagnosis Date  . Acute chest syndrome ([place]) 06/18/2013  . Acute embolism and thrombosis  of right internal jugular vein ([place])   . Alcohol consumption of one to four drinks per day   . Avascular necrosis (HCC)    Right Hip  . Blood transfusion   . Chronic anticoagulation   . Demand ischemia ([place]) 01/02/2014  . Former smoker   . Functional asplenia   . Hb-SS disease with crisis ([place])   . History of Clostridium difficile infection   . History of pulmonary embolus (PE)   . Hypertension   . Hypokalemia   . Leukocytosis    Chronic  . Mood disorder ([place])   . Noncompliance with medication regimen   . Oxygen deficiency   .  Pulmonary hypertension   . Second hand tobacco smoke exposure   . Sickle cell anemia (HCC)   . Sickle-cell crisis with associated acute chest syndrome ([place]) 05/13/2013  . Stroke ([place])   . Thrombocytosis (HCC)    Chronic  . Uses marijuana    Past Surgical History:  Procedure Laterality Date  . CHOLECYSTECTOMY     01/2008  . Excision of left periauricular cyst     10/2009  . Excision of right ear lobe cyst with primary closur     11/2007  . Porta cath placement    . Porta cath removal    . PORTACATH PLACEMENT  01/05/2012   Procedure: INSERTION PORT-A-CATH;  Surgeon: Odis Hollingshead, MD;  Location: [place];  Service: General;  Laterality: N/A;  ultrasound guiced port a cath insertion with fluoroscopy  . Right hip replacement     08/2006  . UMBILICAL HERNIA REPAIR     01/2008   Family History  Problem Relation Age of Onset  . Sickle cell trait Mother   . Depression Mother   . Diabetes Mother   . Sickle cell trait Father   . Sickle cell trait Brother    Social History   Social History  . Marital status: Single    Spouse name: N/A  . Number of children: 0  . Years of education: 77   Occupational History  . 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 Smoking status: Former Smoker    Packs/day: 0.50    Years: 10.00    Types: Cigarettes    Quit date: 05/29/2011  . Smokeless tobacco: Never Used  . Alcohol use No  . Drug use: No     Comment: Marijuana weekly  . Sexual activity: Yes    Partners: Female     Comment: month ago   Other Topics Concern  . Not on file   Social History Narrative   Lives in an apartment.  Single.  Lives alone but has a girlfriend that helps care for him.  Does not use any assist devices.        [name]:  [phone] Mom, emergency contact      [place] [facility]:   Patient continuing to live in her apartment in town alone. 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    Review of  Systems: Constitutional: negative for fatigue and fevers Eyes: positive for icterus Ears, nose, mouth, throat, and face: negative Respiratory: negative for cough, dyspnea on exertion and sputum Cardiovascular: negative for chest pain, dyspnea, fatigue, irregular heart beat and lower extremity edema Gastrointestinal: negative for constipation, diarrhea, nausea and vomiting Genitourinary:negative for dysuria Hematologic/lymphatic: negative Musculoskeletal:positive for myalgias Neurological: negative for coordination problems, dizziness and weakness Behavioral/Psych: negative Endocrine: negative  Allergic/Immunologic: negative  Physical Exam: There were no vitals taken for this visit. BP 116/80 (BP Location: Right Arm)   Pulse 85   Temp 98.5 F (36.9 C) (Oral)   Resp 17   Ht 6' (1.829 m)   Wt 200 lb (90.7 kg)   SpO2 96%   BMI 27.12 kg/m   General Appearance:    Alert, cooperative, no distress, appears stated age  Head:    Normocephalic, without obvious abnormality, atraumatic  Eyes:    PERRL, conjunctiva/corneas clear, EOM's intact, fundi    benign, both eyes. Bilateral Icterus       Ears:    Normal TM's and external ear canals, both ears  Back:     Symmetric, no curvature, ROM normal, no CVA tenderness  Lungs:     Clear to auscultation bilaterally, respirations unlabored  Chest wall:    No tenderness or deformity  Heart:    Regular rate and rhythm, S1 and S2 normal, no murmur, rub   or gallop  Abdomen:     Soft, tender, bowel sounds active all four quadrants,    no masses, no organomegaly  Extremities:   Extremities normal, atraumatic, no cyanosis or edema  Pulses:   2+ and symmetric all extremities  Skin:   Skin color, texture, turgor normal, no rashes or lesions  Lymph nodes:   Cervical, supraclavicular, and axillary nodes normal  Neurologic:   CNII-XII intact. Normal strength, sensation and reflexes      throughout    Lab results: No results found. However, due to the  size of the patient record, not all encounters were searched. Please check Results Review for a complete set of results.  Imaging results:  No results found.   Assessment & Plan:  Patient will be admitted to the day infusion center for extended observation  Start IV D5.45 for cellular rehydration at 50 ml/hr due to hx of pulmonary htn  Start Dilaudid PCA High Concentration per weight based protocol.   Patient will be re-evaluated for pain intensity in the context of function and relationship to baseline as care progresses.  If no significant pain relief, will transfer patient to inpatient services for a higher level of care.   Reviewed laboratory results, hemoglobin is 6.8.       Donia Pounds  MSN, FNP-C [facility] [place], [place] [address] [phone]  05/10/2016, 11:10 AM

## 2016-05-10 NOTE — Discharge Instructions (Signed)
Go to the sickle cell clinic, now, for treatment

## 2016-05-11 IMAGING — CR DG CHEST 1V PORT
1 series · 1 of 1 positions shown · non-contrast
Comparison: Chest CTA 06/17/2013 and earlier.

CLINICAL DATA: 33-year-old male with sickle cell anemia. Hypoxemia.
Initial encounter.

EXAM:
PORTABLE CHEST - 1 VIEW

[AP]
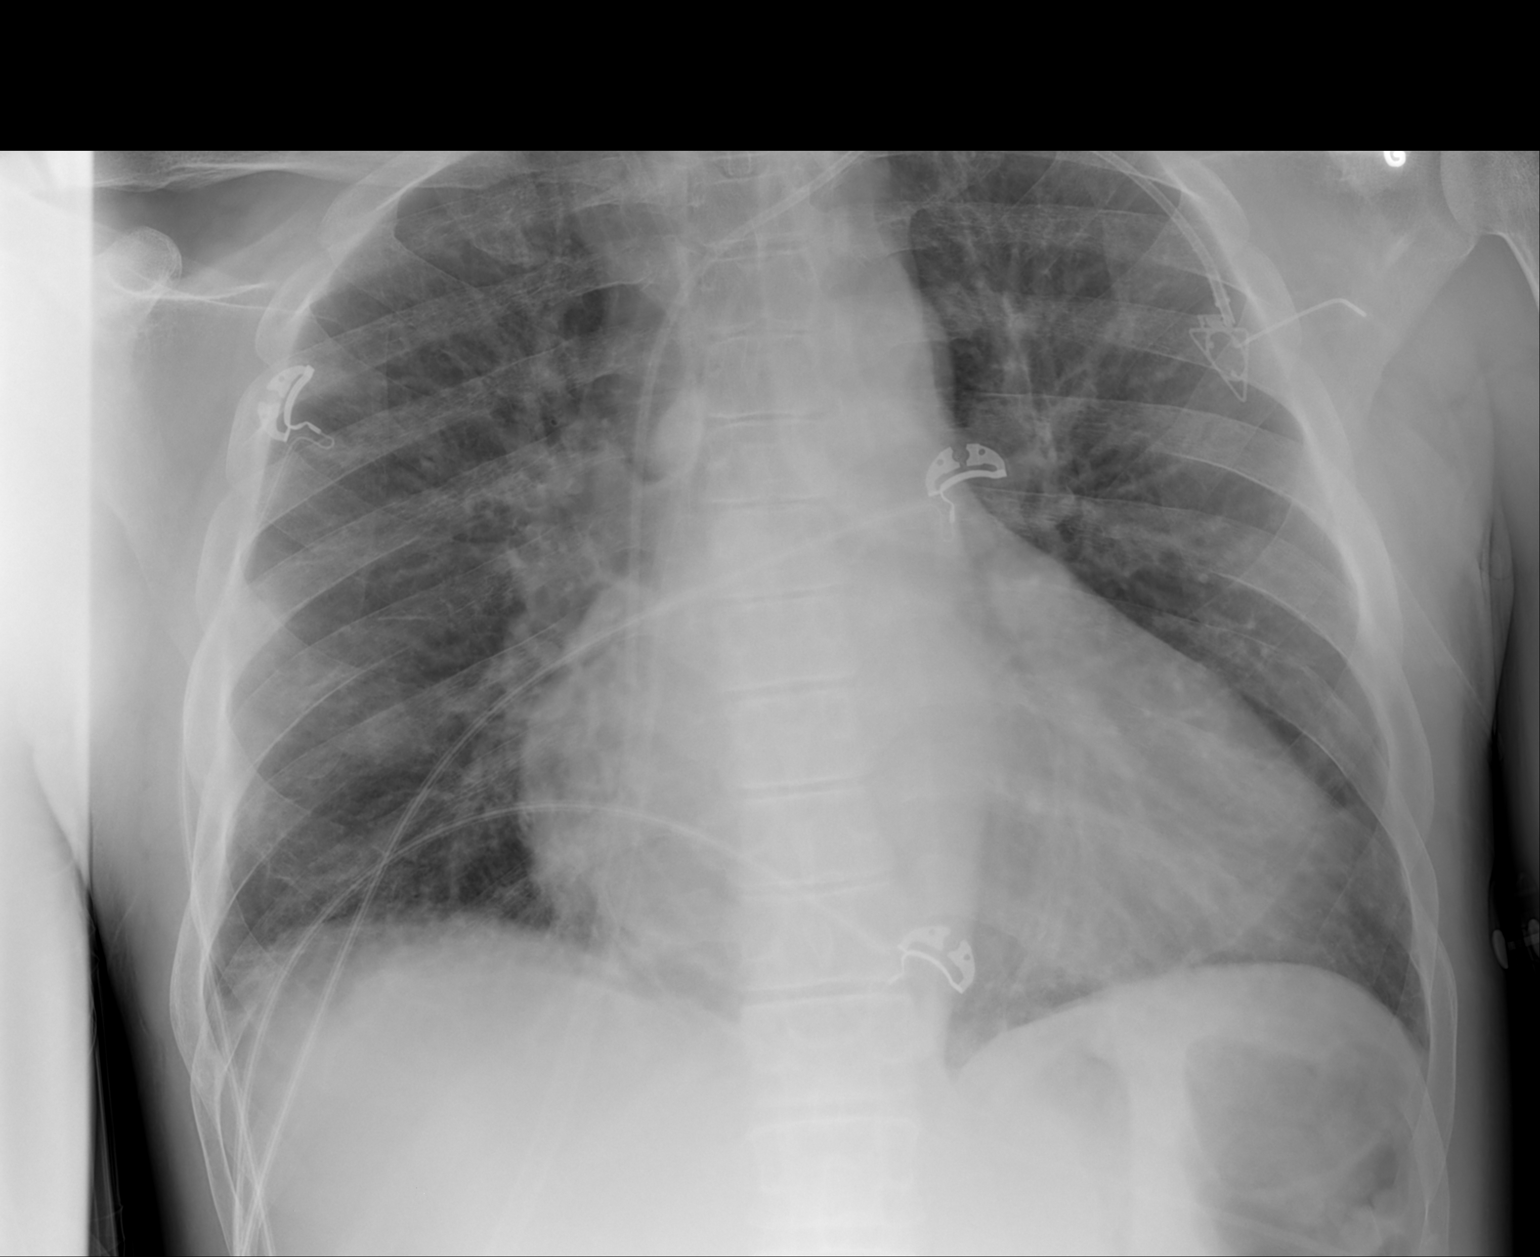

[1 of 1 positions shown; findings below may reference images not displayed]

FINDINGS: Portable AP semi upright view at 1391 hrs. Left chest Port-A-Cath
re-identified, now accessed. Mildly lower lung volumes. Stable
cardiomegaly and mediastinal contours. Visualized tracheal air
column is within normal limits. No pneumothorax. No pulmonary edema.
Mild peripheral nodular pulmonary opacity, most conspicuous in the
right lung, is stable. Overall ventilation not significantly
changed.
IMPRESSION: Stable peripheral nodular pulmonary opacity, most conspicuous in the
right lung, characterized by chest CTA yesterday.

## 2016-05-14 ENCOUNTER — Encounter (HOSPITAL_COMMUNITY): Payer: Self-pay

## 2016-05-14 ENCOUNTER — Inpatient Hospital Stay (HOSPITAL_COMMUNITY)
Admission: EM | Admit: 2016-05-14 | Discharge: 2016-05-16 | DRG: 811 | Disposition: A | Discharge status: Other Acute Inpatient Hospital | Payer: Medicare Other | Attending: Internal Medicine | Admitting: Internal Medicine

## 2016-05-14 ENCOUNTER — Emergency Department (HOSPITAL_COMMUNITY): Payer: Medicare Other

## 2016-05-14 DIAGNOSIS — Z9981 Dependence on supplemental oxygen: Secondary | ICD-10-CM

## 2016-05-14 DIAGNOSIS — R002 Palpitations: Secondary | ICD-10-CM | POA: Diagnosis present

## 2016-05-14 DIAGNOSIS — Z86711 Personal history of pulmonary embolism: Secondary | ICD-10-CM

## 2016-05-14 DIAGNOSIS — I2781 Cor pulmonale (chronic): Secondary | ICD-10-CM | POA: Diagnosis present

## 2016-05-14 DIAGNOSIS — E8770 Fluid overload, unspecified: Secondary | ICD-10-CM | POA: Diagnosis present

## 2016-05-14 DIAGNOSIS — D5701 Hb-SS disease with acute chest syndrome: Secondary | ICD-10-CM | POA: Diagnosis present

## 2016-05-14 DIAGNOSIS — J9621 Acute and chronic respiratory failure with hypoxia: Secondary | ICD-10-CM | POA: Diagnosis not present

## 2016-05-14 DIAGNOSIS — R0902 Hypoxemia: Secondary | ICD-10-CM

## 2016-05-14 DIAGNOSIS — Z8673 Personal history of transient ischemic attack (TIA), and cerebral infarction without residual deficits: Secondary | ICD-10-CM | POA: Diagnosis not present

## 2016-05-14 DIAGNOSIS — Z7722 Contact with and (suspected) exposure to environmental tobacco smoke (acute) (chronic): Secondary | ICD-10-CM | POA: Diagnosis present

## 2016-05-14 DIAGNOSIS — R918 Other nonspecific abnormal finding of lung field: Secondary | ICD-10-CM | POA: Diagnosis not present

## 2016-05-14 DIAGNOSIS — Z79899 Other long term (current) drug therapy: Secondary | ICD-10-CM

## 2016-05-14 DIAGNOSIS — I10 Essential (primary) hypertension: Secondary | ICD-10-CM | POA: Diagnosis present

## 2016-05-14 DIAGNOSIS — Z9119 Patient's noncompliance with other medical treatment and regimen: Secondary | ICD-10-CM | POA: Diagnosis not present

## 2016-05-14 DIAGNOSIS — J811 Chronic pulmonary edema: Secondary | ICD-10-CM | POA: Diagnosis present

## 2016-05-14 DIAGNOSIS — I4581 Long QT syndrome: Secondary | ICD-10-CM | POA: Diagnosis present

## 2016-05-14 DIAGNOSIS — Z96641 Presence of right artificial hip joint: Secondary | ICD-10-CM | POA: Diagnosis present

## 2016-05-14 DIAGNOSIS — Z86718 Personal history of other venous thrombosis and embolism: Secondary | ICD-10-CM | POA: Diagnosis not present

## 2016-05-14 DIAGNOSIS — J9620 Acute and chronic respiratory failure, unspecified whether with hypoxia or hypercapnia: Secondary | ICD-10-CM | POA: Diagnosis present

## 2016-05-14 DIAGNOSIS — Z87891 Personal history of nicotine dependence: Secondary | ICD-10-CM | POA: Diagnosis not present

## 2016-05-14 DIAGNOSIS — D5700 Hb-SS disease with crisis, unspecified: Secondary | ICD-10-CM | POA: Diagnosis present

## 2016-05-14 DIAGNOSIS — I2721 Secondary pulmonary arterial hypertension: Secondary | ICD-10-CM | POA: Diagnosis not present

## 2016-05-14 DIAGNOSIS — D638 Anemia in other chronic diseases classified elsewhere: Secondary | ICD-10-CM | POA: Diagnosis present

## 2016-05-14 DIAGNOSIS — R0789 Other chest pain: Secondary | ICD-10-CM | POA: Diagnosis not present

## 2016-05-14 DIAGNOSIS — D72829 Elevated white blood cell count, unspecified: Secondary | ICD-10-CM | POA: Diagnosis present

## 2016-05-14 DIAGNOSIS — Q8901 Asplenia (congenital): Secondary | ICD-10-CM | POA: Diagnosis not present

## 2016-05-14 DIAGNOSIS — I272 Pulmonary hypertension, unspecified: Secondary | ICD-10-CM | POA: Diagnosis not present

## 2016-05-14 DIAGNOSIS — D649 Anemia, unspecified: Secondary | ICD-10-CM | POA: Diagnosis present

## 2016-05-14 DIAGNOSIS — R079 Chest pain, unspecified: Secondary | ICD-10-CM

## 2016-05-14 DIAGNOSIS — J449 Chronic obstructive pulmonary disease, unspecified: Secondary | ICD-10-CM | POA: Diagnosis present

## 2016-05-14 DIAGNOSIS — Z7901 Long term (current) use of anticoagulants: Secondary | ICD-10-CM | POA: Diagnosis not present

## 2016-05-14 LAB — BLOOD GAS, ARTERIAL
ACID-BASE DEFICIT: 4 mmol/L — AB (ref 0.0–2.0)
Bicarbonate: 20.7 mmol/L (ref 20.0–28.0)
O2 Content: 15 L/min
O2 SAT: 96.5 %
Patient temperature: 98.6
pCO2 arterial: 38.9 mmHg (ref 32.0–48.0)
pH, Arterial: 7.346 — ABNORMAL LOW (ref 7.350–7.450)
pO2, Arterial: 97.3 mmHg (ref 83.0–108.0)

## 2016-05-14 LAB — HEPATIC FUNCTION PANEL
ALK PHOS: 111 U/L (ref 38–126)
ALT: 29 U/L (ref 17–63)
AST: 37 U/L (ref 15–41)
Albumin: 4.3 g/dL (ref 3.5–5.0)
BILIRUBIN DIRECT: 0.6 mg/dL — AB (ref 0.1–0.5)
BILIRUBIN INDIRECT: 5.8 mg/dL — AB (ref 0.3–0.9)
Total Bilirubin: 6.4 mg/dL — ABNORMAL HIGH (ref 0.3–1.2)
Total Protein: 8 g/dL (ref 6.5–8.1)

## 2016-05-14 LAB — BASIC METABOLIC PANEL
ANION GAP: 11 (ref 5–15)
BUN: 18 mg/dL (ref 6–20)
CHLORIDE: 107 mmol/L (ref 101–111)
CO2: 20 mmol/L — AB (ref 22–32)
Calcium: 8.9 mg/dL (ref 8.9–10.3)
Creatinine, Ser: 2.16 mg/dL — ABNORMAL HIGH (ref 0.61–1.24)
GFR, EST AFRICAN AMERICAN: 43 mL/min — AB (ref >60)
GFR, EST NON AFRICAN AMERICAN: 37 mL/min — AB (ref >60)
GLUCOSE: 130 mg/dL — AB (ref 65–99)
Potassium: 4.5 mmol/L (ref 3.5–5.1)
SODIUM: 138 mmol/L (ref 135–145)

## 2016-05-14 LAB — RETICULOCYTES: RBC.: 2.55 MIL/uL — ABNORMAL LOW (ref 4.22–5.81)

## 2016-05-14 LAB — CBC
HEMATOCRIT: 22.9 % — AB (ref 39.0–52.0)
HEMOGLOBIN: 8.2 g/dL — AB (ref 13.0–17.0)
MCH: 32.2 pg (ref 26.0–34.0)
MCHC: 35.8 g/dL (ref 30.0–36.0)
MCV: 89.8 fL (ref 78.0–100.0)
Platelets: 425 10*3/uL — ABNORMAL HIGH (ref 150–400)
RBC: 2.55 MIL/uL — ABNORMAL LOW (ref 4.22–5.81)
RDW: 23 % — AB (ref 11.5–15.5)
WBC: 24 10*3/uL — ABNORMAL HIGH (ref 4.0–10.5)

## 2016-05-14 LAB — MRSA PCR SCREENING: MRSA by PCR: NEGATIVE

## 2016-05-14 LAB — AMMONIA: Ammonia: <9 umol/L — ABNORMAL LOW (ref 9–35)

## 2016-05-14 LAB — I-STAT TROPONIN, ED: Troponin i, poc: 0.21 ng/mL (ref 0.00–0.08)

## 2016-05-14 LAB — I-STAT CG4 LACTIC ACID, ED: Lactic Acid, Venous: 0.81 mmol/L (ref 0.5–1.9)

## 2016-05-14 MED ORDER — L-GLUTAMINE ORAL POWDER
15.0000 g | PACK | Freq: Two times a day (BID) | ORAL | Status: DC
  Administered 2016-05-15 – 2016-05-16 (×3): 15 g via ORAL
  Filled 2016-05-14 (×6): qty 3

## 2016-05-14 MED ORDER — RIVAROXABAN 20 MG PO TABS
20.0000 mg | ORAL_TABLET | Freq: Every day | ORAL | Status: DC
  Administered 2016-05-14 – 2016-05-15 (×2): 20 mg via ORAL
  Filled 2016-05-14 (×3): qty 1

## 2016-05-14 MED ORDER — DEXTROSE-NACL 5-0.45 % IV SOLN: INTRAVENOUS | Status: DC

## 2016-05-14 MED ORDER — AMBRISENTAN 5 MG PO TABS
5.0000 mg | ORAL_TABLET | Freq: Every day | ORAL | Status: DC
  Filled 2016-05-14: qty 1

## 2016-05-14 MED ORDER — HYDROMORPHONE 1 MG/ML IV SOLN
INTRAVENOUS | Status: DC
  Administered 2016-05-14 – 2016-05-15 (×3): via INTRAVENOUS
  Administered 2016-05-15: 13.6 mg via INTRAVENOUS
  Administered 2016-05-15: 2 mg via INTRAVENOUS
  Administered 2016-05-15: 21:00:00 via INTRAVENOUS
  Administered 2016-05-15: 14.2 mg via INTRAVENOUS
  Administered 2016-05-15: 8.2 mg via INTRAVENOUS
  Administered 2016-05-15: 8.8 mg via INTRAVENOUS
  Administered 2016-05-15: 2 mg via INTRAVENOUS
  Administered 2016-05-16: 10.2 mg via INTRAVENOUS
  Administered 2016-05-16: 12.4 mg via INTRAVENOUS
  Administered 2016-05-16: 2 mg via INTRAVENOUS
  Administered 2016-05-16: 8 mg via INTRAVENOUS
  Administered 2016-05-16: 2 mg via INTRAVENOUS
  Administered 2016-05-16: 8.2 mg via INTRAVENOUS
  Administered 2016-05-16: 05:00:00 via INTRAVENOUS
  Administered 2016-05-16: 1.2 mg via INTRAVENOUS
  Filled 2016-05-14 (×5): qty 25

## 2016-05-14 MED ORDER — TORSEMIDE 20 MG PO TABS
20.0000 mg | ORAL_TABLET | Freq: Every day | ORAL | Status: DC
  Administered 2016-05-14 – 2016-05-16 (×3): 20 mg via ORAL
  Filled 2016-05-14 (×3): qty 1

## 2016-05-14 MED ORDER — ZOLPIDEM TARTRATE 10 MG PO TABS
10.0000 mg | ORAL_TABLET | Freq: Every evening | ORAL | Status: DC | PRN
  Administered 2016-05-14 – 2016-05-15 (×2): 10 mg via ORAL
  Filled 2016-05-14 (×2): qty 1

## 2016-05-14 MED ORDER — VITAMIN D 1000 UNITS PO TABS
2000.0000 [IU] | ORAL_TABLET | Freq: Every day | ORAL | Status: DC
  Administered 2016-05-15 – 2016-05-16 (×2): 2000 [IU] via ORAL
  Filled 2016-05-14 (×2): qty 2

## 2016-05-14 MED ORDER — HYDROMORPHONE HCL 2 MG/ML IJ SOLN
2.0000 mg | INTRAMUSCULAR | Status: DC | PRN
  Administered 2016-05-14 – 2016-05-15 (×11): 2 mg via INTRAVENOUS
  Filled 2016-05-14 (×2): qty 1

## 2016-05-14 MED ORDER — CARVEDILOL 3.125 MG PO TABS
3.1250 mg | ORAL_TABLET | Freq: Two times a day (BID) | ORAL | Status: DC
  Administered 2016-05-14 – 2016-05-16 (×5): 3.125 mg via ORAL
  Filled 2016-05-14 (×6): qty 1

## 2016-05-14 MED ORDER — FOLIC ACID 1 MG PO TABS
2.0000 mg | ORAL_TABLET | Freq: Every day | ORAL | Status: DC
  Administered 2016-05-14 – 2016-05-16 (×3): 2 mg via ORAL
  Filled 2016-05-14 (×3): qty 2

## 2016-05-14 MED ORDER — GABAPENTIN 300 MG PO CAPS
300.0000 mg | ORAL_CAPSULE | Freq: Three times a day (TID) | ORAL | Status: DC
  Administered 2016-05-14 – 2016-05-16 (×7): 300 mg via ORAL
  Filled 2016-05-14 (×7): qty 1

## 2016-05-14 MED ORDER — DIPHENHYDRAMINE HCL 50 MG/ML IJ SOLN
25.0000 mg | INTRAMUSCULAR | Status: DC | PRN
  Filled 2016-05-14: qty 0.5

## 2016-05-14 MED ORDER — SODIUM CHLORIDE 0.9% FLUSH: 9.0000 mL | INTRAVENOUS | Status: DC | PRN

## 2016-05-14 MED ORDER — HYDROMORPHONE HCL 2 MG/ML IJ SOLN
0.5000 mg | Freq: Once | INTRAMUSCULAR | Status: AC
  Administered 2016-05-14: 0.5 mg via INTRAVENOUS
  Filled 2016-05-14: qty 1

## 2016-05-14 MED ORDER — ASPIRIN 81 MG PO CHEW
81.0000 mg | CHEWABLE_TABLET | Freq: Every day | ORAL | Status: DC
  Administered 2016-05-14 – 2016-05-16 (×3): 81 mg via ORAL
  Filled 2016-05-14 (×3): qty 1

## 2016-05-14 MED ORDER — POLYETHYLENE GLYCOL 3350 17 G PO PACK: 17.0000 g | PACK | Freq: Every day | ORAL | Status: DC | PRN

## 2016-05-14 MED ORDER — ONDANSETRON HCL 4 MG/2ML IJ SOLN: 4.0000 mg | Freq: Four times a day (QID) | INTRAMUSCULAR | Status: DC | PRN

## 2016-05-14 MED ORDER — SENNOSIDES-DOCUSATE SODIUM 8.6-50 MG PO TABS
1.0000 | ORAL_TABLET | Freq: Two times a day (BID) | ORAL | Status: DC
  Administered 2016-05-14 – 2016-05-16 (×5): 1 via ORAL
  Filled 2016-05-14 (×5): qty 1

## 2016-05-14 MED ORDER — DEXTROSE-NACL 5-0.45 % IV SOLN
INTRAVENOUS | Status: DC
  Administered 2016-05-14: 11:00:00 via INTRAVENOUS

## 2016-05-14 MED ORDER — MORPHINE SULFATE ER 30 MG PO TBCR
30.0000 mg | EXTENDED_RELEASE_TABLET | Freq: Two times a day (BID) | ORAL | Status: DC
  Administered 2016-05-14 – 2016-05-16 (×4): 30 mg via ORAL
  Filled 2016-05-14 (×4): qty 1

## 2016-05-14 MED ORDER — POTASSIUM CHLORIDE CRYS ER 20 MEQ PO TBCR
40.0000 meq | EXTENDED_RELEASE_TABLET | Freq: Every day | ORAL | Status: DC
  Administered 2016-05-14 – 2016-05-16 (×3): 40 meq via ORAL
  Filled 2016-05-14 (×3): qty 2

## 2016-05-14 MED ORDER — HYDROXYUREA 500 MG PO CAPS
1000.0000 mg | ORAL_CAPSULE | ORAL | Status: DC
  Administered 2016-05-15: 1000 mg via ORAL
  Filled 2016-05-14: qty 2

## 2016-05-14 MED ORDER — NALOXONE HCL 0.4 MG/ML IJ SOLN: 0.4000 mg | INTRAMUSCULAR | Status: DC | PRN

## 2016-05-14 MED ORDER — DIPHENHYDRAMINE HCL 25 MG PO CAPS
25.0000 mg | ORAL_CAPSULE | ORAL | Status: DC | PRN
  Administered 2016-05-14: 50 mg via ORAL
  Administered 2016-05-14 – 2016-05-15 (×2): 25 mg via ORAL
  Administered 2016-05-15: 50 mg via ORAL
  Filled 2016-05-14 (×2): qty 1
  Filled 2016-05-14 (×2): qty 2

## 2016-05-14 MED ORDER — HYDROXYUREA 500 MG PO CAPS
500.0000 mg | ORAL_CAPSULE | ORAL | Status: DC
  Administered 2016-05-16: 500 mg via ORAL
  Filled 2016-05-14: qty 1

## 2016-05-14 NOTE — Progress Notes (Signed)
Rt placed pt on 45%/10LPM VM.

## 2016-05-14 NOTE — ED Triage Notes (Signed)
He states he has had sickle cell pain in his left shoulder/chest area x ~ 4 hours "that's getting worse". He is taken straight to resus A upon being found to have spo2 of 83% at triage.

## 2016-05-14 NOTE — ED Notes (Signed)
Per Ralene Bathe place pt back on NRB.

## 2016-05-14 NOTE — ED Notes (Signed)
Bed: WA19 Expected date:  Expected time:  Means of arrival:  Comments: 

## 2016-05-14 NOTE — H&P (Signed)
[name] is an 36 y.o. male.   Chief Complaint: Pain all over and shortness of breath HPI: Patient is a 36 year old gentleman well-known to our service with known history of sickle cell disease and pulmonary hypertension who presented to the ER by EMS complaining of shortness of breath with chest pain. Chest pain involving left shoulder left-sided area. His been going on for 4 hours. The pain was getting worse with sharp and intermittent. No nausea vomiting no diaphoresis. The pain is not relieved by anything including his regular home medication. Patient believed he is having recurrence of his sickle cell crisis. He was found to be hypoxic with oxygen sat of 83% on 3 L which is supposed to be his home dose. Patient is known to be noncompliant with his oxygen. He is oxygen dependent but only uses it at night. He has multiple other medical problems including being a former smoker with some COPD symptoms. He is currently on nonrebreather bag with oxygen sats in the upper 90s. We will admit him therefore for father treatment.  Past Medical History:  Diagnosis Date  . Acute chest syndrome ([place]) 06/18/2013  . Acute embolism and thrombosis of right internal jugular vein ([place])   . Alcohol consumption of one to four drinks per day   . Avascular necrosis (HCC)    Right Hip  . Blood transfusion   . Chronic anticoagulation   . Demand ischemia ([place]) 01/02/2014  . Former smoker   . Functional asplenia   . Hb-SS disease with crisis ([place])   . History of Clostridium difficile infection   . History of pulmonary embolus (PE)   . Hypertension   . Hypokalemia   . Leukocytosis    Chronic  . Mood disorder ([place])   . Noncompliance with medication regimen   . Oxygen deficiency   . Pulmonary hypertension ([place])   . Second hand tobacco smoke exposure   . Sickle cell anemia (HCC)   . Sickle-cell crisis with associated acute chest syndrome ([place]) 05/13/2013  . Stroke ([place])   . Thrombocytosis (HCC)    Chronic   . Uses marijuana     Past Surgical History:  Procedure Laterality Date  . CHOLECYSTECTOMY     01/2008  . Excision of left periauricular cyst     10/2009  . Excision of right ear lobe cyst with primary closur     11/2007  . Porta cath placement    . Porta cath removal    . PORTACATH PLACEMENT  01/05/2012   Procedure: INSERTION PORT-A-CATH;  Surgeon: Odis Hollingshead, MD;  Location: [place];  Service: General;  Laterality: N/A;  ultrasound guiced port a cath insertion with fluoroscopy  . Right hip replacement     08/2006  . UMBILICAL HERNIA REPAIR     01/2008    Family History  Problem Relation Age of Onset  . Sickle cell trait Mother   . Depression Mother   . Diabetes Mother   . Sickle cell trait Father   . Sickle cell trait Brother    Social History:  reports that he quit smoking about 4 years ago. His smoking use included Cigarettes. He has a 5.00 pack-year smoking history. He has never used smokeless tobacco. He reports that he does not drink alcohol or use drugs.  Allergies: No Known Allergies   (Not in a hospital admission)  Results for orders placed or performed during the hospital encounter of 05/14/16 (from the past 48 hour(s))  Basic metabolic panel  Status: Abnormal   Collection Time: 05/14/16 10:04 AM  Result Value Ref Range   Sodium 138 135 - 145 mmol/L   Potassium 4.5 3.5 - 5.1 mmol/L   Chloride 107 101 - 111 mmol/L   CO2 20 (L) 22 - 32 mmol/L   Glucose, Bld 130 (H) 65 - 99 mg/dL   BUN 18 6 - 20 mg/dL   Creatinine, Ser 2.16 (H) 0.61 - 1.24 mg/dL   Calcium 8.9 8.9 - 10.3 mg/dL   GFR calc non Af Amer 37 (L) >60 mL/min   GFR calc Af Amer 43 (L) >60 mL/min    Comment: (NOTE) The eGFR has been calculated using the CKD EPI equation. This calculation has not been validated in all clinical situations. eGFR's persistently <60 mL/min signify possible Chronic Kidney Disease.    Anion gap 11 5 - 15  CBC     Status: Abnormal   Collection Time: 05/14/16 10:04  AM  Result Value Ref Range   WBC 24.0 (H) 4.0 - 10.5 K/uL   RBC 2.55 (L) 4.22 - 5.81 MIL/uL   Hemoglobin 8.2 (L) 13.0 - 17.0 g/dL   HCT 22.9 (L) 39.0 - 52.0 %   MCV 89.8 78.0 - 100.0 fL   MCH 32.2 26.0 - 34.0 pg   MCHC 35.8 30.0 - 36.0 g/dL   RDW 23.0 (H) 11.5 - 15.5 %   Platelets 425 (H) 150 - 400 K/uL  Reticulocytes     Status: Abnormal   Collection Time: 05/14/16 10:04 AM  Result Value Ref Range   Retic Ct Pct >23.0 (H) 0.4 - 3.1 %    Comment: RESULTS CONFIRMED BY MANUAL DILUTION   RBC. 2.55 (L) 4.22 - 5.81 MIL/uL   Retic Count, Manual NOT CALCULATED 19.0 - 186.0 K/uL  Hepatic function panel     Status: Abnormal   Collection Time: 05/14/16 10:04 AM  Result Value Ref Range   Total Protein 8.0 6.5 - 8.1 g/dL   Albumin 4.3 3.5 - 5.0 g/dL   AST 37 15 - 41 U/L   ALT 29 17 - 63 U/L   Alkaline Phosphatase 111 38 - 126 U/L   Total Bilirubin 6.4 (H) 0.3 - 1.2 mg/dL   Bilirubin, Direct 0.6 (H) 0.1 - 0.5 mg/dL   Indirect Bilirubin 5.8 (H) 0.3 - 0.9 mg/dL  I-stat troponin, ED     Status: Abnormal   Collection Time: 05/14/16 10:11 AM  Result Value Ref Range   Troponin i, poc 0.21 (HH) 0.00 - 0.08 ng/mL   Comment NOTIFIED PHYSICIAN    Comment 3            Comment: Due to the release kinetics of cTnI, a negative result within the first hours of the onset of symptoms does not rule out myocardial infarction with certainty. If myocardial infarction is still suspected, repeat the test at appropriate intervals.   Ammonia     Status: Abnormal   Collection Time: 05/14/16 10:18 AM  Result Value Ref Range   Ammonia <9 (L) 9 - 35 umol/L  I-Stat CG4 Lactic Acid, ED     Status: None   Collection Time: 05/14/16 10:45 AM  Result Value Ref Range   Lactic Acid, Venous 0.81 0.5 - 1.9 mmol/L  Blood gas, arterial ([facility] & AP ONLY)     Status: Abnormal   Collection Time: 05/14/16 10:50 AM  Result Value Ref Range   O2 Content 15.0 L/min   pH, Arterial 7.346 (L) 7.350 - 7.450  pCO2 arterial 38.9  32.0 - 48.0 mmHg   pO2, Arterial 97.3 83.0 - 108.0 mmHg   Bicarbonate 20.7 20.0 - 28.0 mmol/L   Acid-base deficit 4.0 (H) 0.0 - 2.0 mmol/L   O2 Saturation 96.5 %   Patient temperature 98.6    Allens test (pass/fail) PASS PASS   *Note: Due to a large number of results and/or encounters for the requested time period, some results have not been displayed. A complete set of results can be found in Results Review.   Dg Chest Port 1 View  Result Date: 05/14/2016 CLINICAL DATA:  Acute chest pain.  History of sickle cell anemia. EXAM: PORTABLE CHEST 1 VIEW COMPARISON:  04/29/2016 and prior exams FINDINGS: Cardiomegaly and mild vascular congestion noted. A left Port-A-Cath is present with tip overlying the superior cavoatrial junction. There is no evidence of focal airspace disease, pulmonary edema, suspicious pulmonary nodule/mass, pleural effusion, or pneumothorax. No acute bony abnormalities are identified. Left humeral head AVN again noted. IMPRESSION: Cardiomegaly and mild pulmonary vascular congestion. Electronically Signed   By: Margarette Canada M.D.   On: 05/14/2016 10:23    Review of Systems  Constitutional: Positive for malaise/fatigue. Negative for fever.  HENT: Negative.   Eyes: Negative.   Respiratory: Positive for shortness of breath. Negative for cough and hemoptysis.   Cardiovascular: Positive for palpitations and leg swelling.  Gastrointestinal: Negative.   Genitourinary: Negative.   Musculoskeletal: Positive for back pain, joint pain and myalgias.  Skin: Negative.   Neurological: Positive for weakness.  Endo/Heme/Allergies: Negative.   Psychiatric/Behavioral: The patient is nervous/anxious.     Blood pressure 121/71, pulse 93, temperature 98.1 F (36.7 C), temperature source Oral, resp. rate 16, height 6' (1.829 m), weight 90.7 kg (200 lb), SpO2 100 %. Physical Exam  Constitutional: He is oriented to person, place, and time. He appears well-developed and well-nourished.  HENT:   Head: Normocephalic and atraumatic.  Eyes: Conjunctivae are normal. Pupils are equal, round, and reactive to light.  Neck: Normal range of motion. Neck supple.  Cardiovascular: Normal rate and regular rhythm.   Respiratory: He is in respiratory distress. He has no wheezes. He has rales.  GI: Soft. Bowel sounds are normal.  Musculoskeletal: Normal range of motion. He exhibits no edema or deformity.  Neurological: He is alert and oriented to person, place, and time.  Skin: Skin is warm and dry.  Psychiatric: He has a normal mood and affect.     Assessment/Plan A 36 year old gentleman presenting with what appears to be acute sickle cell crisis. No evidence of acute chest syndrome.  #1 acute on chronic hypoxemia: This is most likely due to acute crisis but no obvious acutechest syndrome. Patient will be placed on oxygen high flow at 10 L four-minute using facemask. Continue to treated in the line sickle cell crisis. I'll admit him to step down unit. Continue to titrate oxygen off once he is on his baseline oxygen he'll be transferred to the floor.  #2 sickle cell painful crisis: I will initiate Dilaudid PCA with Toradol and IV fluids. Monitor patient's response closely. Continue his long-acting pain medications from home  #3 chronic anticoagulation: patient will be maintain on home regimen for his anticoagulation.  #4 pulmonary hypertension: Continue palpitations current regimen. He will have to be compliant with treatment and counseling has been given.  #5 anemia of chronic disease: Hemoglobin Is stable at baseline. Monitor closely and if transfusion is desired we will do it.   Barbette Merino, MD 05/14/2016, 12:12 PM

## 2016-05-14 NOTE — ED Notes (Signed)
Pt verbalizes uses 3 lpm [place] oxygen at home; pt did not present to ED with oxygen. Pt verbalizes left chest pain radiating to left shoulder onset 0500 typical with SCC. Pt denies associated symptoms. Pt verbalizes takes blood thinner as prescribed.

## 2016-05-14 NOTE — ED Notes (Signed)
Per Ralene Bathe continue to leave pt on NRB until evaluated by sickle cell MD.

## 2016-05-14 NOTE — ED Notes (Signed)
Per PA place pt on 6 lpm [place].

## 2016-05-14 NOTE — ED Provider Notes (Signed)
[facility] DEPT Provider Note   CSN: [id number] Arrival date & time: 05/14/16  0944     History   Chief Complaint Chief Complaint  Patient presents with  . Sickle Cell Pain Crisis    HPI [name] is a 36 y.o. male.  HPI 36 year old African-American male with past medical history significant for sickle cell anemia, history of acute chest syndrome, acute embolism of the right internal jugular vein currently on anticoagulation, tobacco abuse, 3 L oxygen requirement at home that presents to the ED today from home by EMS with acute onset of left shoulder and left-sided chest pain for the past 4 hours. States the pain is "getting worse". He describes the pain as sharp in nature and is intermittent. Not associated with shortness of breath, diaphoreses, nausea, vomiting. Patient denies any history of cardiac disease. Pain unrelieved by home pain medication.  Does have a history of sickle cell anemia and sickle cell pain. States that his pain is usually in his lower back and rib cage this feels different. The pain has been constant. EMS did not do anything for the pain in route. On arrival patient was noted to be hypoxic at 83%. Patient states that he is supposed to be on 3 L of oxygen at home and does not always use. Patient was just released from the ED on 05/10/2016 after a stay for sickle cell crisis pain that wasn't controlled by pain medicine with acute chest syndrome. Patient denies any fever, chills, headache, vision changes, cough, abdominal pain, nausea, emesis, urinary symptoms, change in bowel habits. Past Medical History:  Diagnosis Date  . Acute chest syndrome ([place]) 06/18/2013  . Acute embolism and thrombosis of right internal jugular vein ([place])   . Alcohol consumption of one to four drinks per day   . Avascular necrosis (HCC)    Right Hip  . Blood transfusion   . Chronic anticoagulation   . Demand ischemia ([place]) 01/02/2014  . Former smoker   . Functional asplenia   .  Hb-SS disease with crisis ([place])   . History of Clostridium difficile infection   . History of pulmonary embolus (PE)   . Hypertension   . Hypokalemia   . Leukocytosis    Chronic  . Mood disorder ([place])   . Noncompliance with medication regimen   . Oxygen deficiency   . Pulmonary hypertension ([place])   . Second hand tobacco smoke exposure   . Sickle cell anemia (HCC)   . Sickle-cell crisis with associated acute chest syndrome ([place]) 05/13/2013  . Stroke ([place])   . Thrombocytosis (HCC)    Chronic  . Uses marijuana     Patient Active Problem List   Diagnosis Date Noted  . Respiratory failure, acute and chronic ([place]) 05/14/2016  . Acute pulmonary edema ([place]) 04/11/2016  . H/O arterial ischemic stroke 11/11/2015  . Slurred speech 11/10/2015  . Chronic anemia   . Hb-SS disease without crisis ([place]) 10/07/2014  . Acute on chronic respiratory failure with hypoxia ([place])   . Prolonged Q-T interval on ECG 09/10/2014  . Troponin level elevated 08/29/2014  . Cor pulmonale, chronic ([place]) 08/25/2014  . Sickle cell anemia ([place]) 06/25/2014  . Anemia of chronic disease 06/25/2014  . PAH (pulmonary artery hypertension) ([place]) 03/18/2014  . Chronic respiratory failure with hypoxia ([place]) 03/14/2014  . Paralytic strabismus, external ophthalmoplegia   . Chronic pain syndrome 12/12/2013  . Chronic anticoagulation 08/22/2013  . Essential hypertension 08/22/2013  . Pulmonary HTN ([place]) 06/18/2013  . Functional  asplenia   . Vitamin D deficiency 02/13/2013  . Sickle cell crisis ([place]) 01/13/2013  . Rib pain 01/13/2013  . Hx of pulmonary embolus 06/29/2012  . 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 Avascular necrosis ([place])   . Sickle cell pain crisis [facility])     Past Surgical History:  Procedure Laterality Date  . CHOLECYSTECTOMY     01/2008  . Excision of left periauricular cyst     10/2009  . Excision of right ear lobe cyst with primary closur     11/2007  . Porta cath placement    . Porta cath  removal    . PORTACATH PLACEMENT  01/05/2012   Procedure: INSERTION PORT-A-CATH;  Surgeon: Odis Hollingshead, MD;  Location: [facility];  Service: General;  Laterality: N/A;  ultrasound guiced port a cath insertion with fluoroscopy  . Right hip replacement     08/2006  . UMBILICAL HERNIA REPAIR     01/2008       Home Medications    Prior to Admission medications   Medication Sig Start Date End Date Taking? Authorizing Provider  ambrisentan (LETAIRIS) 5 MG tablet Take 5 mg by mouth daily.    Yes Historical Provider, MD  aspirin 81 MG chewable tablet Chew 1 tablet (81 mg total) by mouth daily. 07/23/14  Yes [name], MD  cholecalciferol (VITAMIN D) 1000 units tablet Take 2,000 Units by mouth daily.   Yes Historical Provider, MD  folic acid (FOLVITE) 1 MG tablet Take 2 tablets (2 mg total) by mouth daily. 04/25/16  Yes [name], MD  furosemide (LASIX) 20 MG tablet Take 1 tablet (20 mg total) by mouth 2 (two) times daily. 05/08/16 05/14/16 Yes [name], MD  gabapentin (NEURONTIN) 300 MG capsule Take 1 capsule (300 mg total) by mouth 3 (three) times daily. 06/01/15  Yes Tresa Garter, MD  HYDROmorphone (DILAUDID) 4 MG tablet Take 2 tablets (8 mg total) by mouth every 4 (four) hours as needed for moderate pain or severe pain. 05/06/16 05/21/16 Yes Olugbemiga Essie Christine, MD  hydroxyurea (HYDREA) 500 MG capsule Take 1000 mg QOD and alternate with 500 mg QOD. May take with food to minimize GI side effects. Patient taking differently: Take 500-1,000 mg by mouth See admin instructions. Take 1000 mg QOD and alternate with 500 mg QOD. May take with food to minimize GI side effects. 04/25/16  Yes [name], MD  L-glutamine (ENDARI) 5 g PACK Powder Packet Take 15 g by mouth 2 (two) times daily. 04/25/16  Yes [name], MD  lisinopril (PRINIVIL,ZESTRIL) 10 MG tablet Take 10 mg by mouth daily.   Yes Historical Provider, MD  morphine (MS CONTIN) 30 MG 12 hr tablet Take  1 tablet (30 mg total) by mouth every 12 (twelve) hours. 05/06/16 06/05/16 Yes Tresa Garter, MD  potassium chloride SA (K-DUR,KLOR-CON) 20 MEQ tablet Take 2 tablets (40 mEq total) by mouth daily. 04/27/16  Yes [name], MD  rivaroxaban (XARELTO) 20 MG TABS tablet Take 1 tablet (20 mg total) by mouth at bedtime. 12/29/15  Yes Tresa Garter, MD  torsemide (DEMADEX) 20 MG tablet Take 1 tablet (20 mg total) by mouth daily. 05/08/16  Yes [name], MD  zolpidem (AMBIEN) 10 MG tablet Take 1 tablet (10 mg total) by mouth at bedtime as needed for sleep. 05/06/16 06/05/16 Yes Tresa Garter, MD    Family History Family History  Problem Relation Age of Onset  .  Sickle cell trait Mother   . Depression Mother   . Diabetes Mother   . Sickle cell trait Father   . Sickle cell trait Brother     Social History Social History  Substance Use Topics  . Smoking status: Former Smoker    Packs/day: 0.50    Years: 10.00    Types: Cigarettes    Quit date: 05/29/2011  . Smokeless tobacco: Never Used  . Alcohol use No     Allergies   Patient has no known allergies.   Review of Systems Review of Systems  Constitutional: Negative for chills and fever.  HENT: Negative for congestion.   Eyes: Negative for visual disturbance.  Respiratory: Negative for cough, shortness of breath and wheezing.   Cardiovascular: Positive for chest pain. Negative for leg swelling.  Gastrointestinal: Negative for abdominal pain, diarrhea, nausea and vomiting.  Genitourinary: Negative for dysuria, flank pain, frequency, hematuria and urgency.  Musculoskeletal: Positive for back pain.  Skin: Negative for wound.  Neurological: Negative for dizziness, syncope, weakness, light-headedness and headaches.     Physical Exam Updated Vital Signs BP 123/88   Pulse 95   Temp 98.1 F (36.7 C) (Oral)   Resp 18   Ht 6' (1.829 m)   Wt 90.7 kg   SpO2 94%   BMI 27.12 kg/m   Physical Exam    Constitutional: He is oriented to person, place, and time. He appears well-developed and well-nourished. He is sleeping.  Non-toxic appearance. He appears ill. He appears distressed.  HENT:  Head: Normocephalic and atraumatic.  Mouth/Throat: Oropharynx is clear and moist.  Eyes: Conjunctivae and EOM are normal. Pupils are equal, round, and reactive to light. Right eye exhibits no discharge. Left eye exhibits no discharge. Scleral icterus is present.  Neck: Normal range of motion. Neck supple. No thyromegaly present.  Cardiovascular: Regular rhythm, normal heart sounds and intact distal pulses.  Exam reveals no gallop and no friction rub.   No murmur heard. Tachycardia  Pulmonary/Chest: Breath sounds normal. He is in respiratory distress. He has no wheezes. He has no rales. He exhibits no tenderness.  Hypoxia and tachypnea noted. No wheezing, crackles, course sounds noted.  Abdominal: Soft. Bowel sounds are normal. He exhibits no distension. There is no tenderness. There is no rebound and no guarding.  Musculoskeletal: Normal range of motion.  Lymphadenopathy:    He has no cervical adenopathy.  Neurological: He is alert and oriented to person, place, and time.  Skin: Skin is warm and dry. Capillary refill takes less than 2 seconds.  Nursing note and vitals reviewed.    ED Treatments / Results  Labs (all labs ordered are listed, but only abnormal results are displayed) Labs Reviewed  BASIC METABOLIC PANEL - Abnormal; Notable for the following:       Result Value   CO2 20 (*)    Glucose, Bld 130 (*)    Creatinine, Ser 2.16 (*)    GFR calc non Af Amer 37 (*)    GFR calc Af Amer 43 (*)    All other components within normal limits  CBC - Abnormal; Notable for the following:    WBC 24.0 (*)    RBC 2.55 (*)    Hemoglobin 8.2 (*)    HCT 22.9 (*)    RDW 23.0 (*)    Platelets 425 (*)    All other components within normal limits  RETICULOCYTES - Abnormal; Notable for the following:     Retic Ct Pct >23.0 (*)  RBC. 2.55 (*)    All other components within normal limits  AMMONIA - Abnormal; Notable for the following:    Ammonia <9 (*)    All other components within normal limits  HEPATIC FUNCTION PANEL - Abnormal; Notable for the following:    Total Bilirubin 6.4 (*)    Bilirubin, Direct 0.6 (*)    Indirect Bilirubin 5.8 (*)    All other components within normal limits  BLOOD GAS, ARTERIAL - Abnormal; Notable for the following:    pH, Arterial 7.346 (*)    Acid-base deficit 4.0 (*)    All other components within normal limits  I-STAT TROPOININ, ED - Abnormal; Notable for the following:    Troponin i, poc 0.21 (*)    All other components within normal limits  I-STAT CG4 LACTIC ACID, ED    EKG  EKG Interpretation  Date/Time:  Saturday May 14 2016 09:58:46 EDT Ventricular Rate:  115 PR Interval:    QRS Duration: 120 QT Interval:  319 QTC Calculation: 442 R Axis:   -108 Text Interpretation:  Sinus tachycardia Probable left atrial enlargement Nonspecific IVCD with LAD Inferior infarct, old Probable lateral infarct, age indeterminate Confirmed by Hazle Coca [id number]) on 05/14/2016 10:10:35 AM Also confirmed by Hazle Coca [phone]), editor Drema Pry [id number])  on 05/14/2016 11:49:30 AM       Radiology Dg Chest Port 1 View  Result Date: 05/14/2016 CLINICAL DATA:  Acute chest pain.  History of sickle cell anemia. EXAM: PORTABLE CHEST 1 VIEW COMPARISON:  04/29/2016 and prior exams FINDINGS: Cardiomegaly and mild vascular congestion noted. A left Port-A-Cath is present with tip overlying the superior cavoatrial junction. There is no evidence of focal airspace disease, pulmonary edema, suspicious pulmonary nodule/mass, pleural effusion, or pneumothorax. No acute bony abnormalities are identified. Left humeral head AVN again noted. IMPRESSION: Cardiomegaly and mild pulmonary vascular congestion. Electronically Signed   By: Margarette Canada M.D.   On: 05/14/2016 10:23     Procedures Procedures (including critical care time)  Medications Ordered in ED Medications  dextrose 5 %-0.45 % sodium chloride infusion ( Intravenous New Bag/Given 05/14/16 1112)  aspirin chewable tablet 81 mg (not administered)  gabapentin (NEURONTIN) capsule 300 mg (not administered)  ambrisentan (LETAIRIS) tablet 5 mg (not administered)  cholecalciferol (VITAMIN D) tablet 2,000 Units (not administered)  rivaroxaban (XARELTO) tablet 20 mg (not administered)  carvedilol (COREG) tablet 3.125 mg (not administered)  folic acid (FOLVITE) tablet 2 mg (not administered)  hydroxyurea (HYDREA) capsule 1,000 mg (not administered)  L-glutamine (ENDARI) Powder Packet 15 g (not administered)  potassium chloride SA (K-DUR,KLOR-CON) CR tablet 40 mEq (not administered)  zolpidem (AMBIEN) tablet 10 mg (not administered)  morphine (MS CONTIN) 12 hr tablet 30 mg (not administered)  torsemide (DEMADEX) tablet 20 mg (not administered)  senna-docusate (Senokot-S) tablet 1 tablet (not administered)  polyethylene glycol (MIRALAX / GLYCOLAX) packet 17 g (not administered)  naloxone (NARCAN) injection 0.4 mg (not administered)    And  sodium chloride flush (NS) 0.9 % injection 9 mL (not administered)  ondansetron (ZOFRAN) injection 4 mg (not administered)  diphenhydrAMINE (BENADRYL) capsule 25-50 mg (not administered)    Or  diphenhydrAMINE (BENADRYL) 25 mg in sodium chloride 0.9 % 50 mL IVPB (not administered)  dextrose 5 %-0.45 % sodium chloride infusion (not administered)  HYDROmorphone (DILAUDID) injection 2 mg (2 mg Intravenous Given 05/14/16 1359)  hydroxyurea (HYDREA) capsule 500 mg (not administered)  HYDROmorphone (DILAUDID) injection 0.5 mg (0.5 mg Intravenous Given 05/14/16 1209)  Initial Impression / Assessment and Plan / ED Course  I have reviewed the triage vital signs and the nursing notes.  Pertinent labs & imaging results that were available during my care of the patient were  reviewed by me and considered in my medical decision making (see chart for details).     Patient resents to the ED with complaints of sickle cell pain crisis and acute onset of left-sided chest pain this morning when he awakened. He complains of left-sided chest pain that radiates to his left shoulder denies any associated symptoms of diaphoresis, nausea, emesis. No cardiac history. Pt hypoxic on arrival. Pt is on chronic 3L of oxygen at home and is non compliant. Pt placed on non rebreather with sats at 90%. Spoke with respiratory who stats this is pt baseline. Pt does not have increased work of breathing. CXR show cardiomegaly with pulmonary edema. Leukocytosis of 24,000. Pt with scleral icterus on exam. Liver enyzmes normal. Bilirubin elevated. EKG show sinus tach with ischemic changes. Troponin elevated however pt with history of chronically elevated troponin. Likely demand ischemia. Creatine elevated at 2.16 history of elevated creatine, however acutely elevated. Lactic acid normal. Ammonia normal. Ph is 7.346. Anion gap normal. Pt is reticing. Pt was dicussed and evaluated by Dr. Ralene Bathe who will speak with Dr. Jonelle Sidle who is on for sickle cell call today.  Dr. Ralene Bathe spoke with Dr. Jonelle Sidle who states that pt is chronically ill and is non compliant with oxygen at home. He will come to the Ed to evaluate pt. Pt is requesting pain meds whoever given is current conditions and hypoxia will hold on meds at this time. Pt up dated on plan of care. Spo2 remains at 90% on NRB.   Final Clinical Impressions(s) / ED Diagnoses   Final diagnoses:  Hypoxia  Chest pain, unspecified type    New Prescriptions New Prescriptions   No medications on file     Doristine Devoid, PA-C 05/15/16 2019    Quintella Reichert, MD 05/16/16 [phone]

## 2016-05-15 DIAGNOSIS — D5701 Hb-SS disease with acute chest syndrome: Secondary | ICD-10-CM | POA: Diagnosis not present

## 2016-05-15 DIAGNOSIS — Q8901 Asplenia (congenital): Secondary | ICD-10-CM | POA: Diagnosis not present

## 2016-05-15 DIAGNOSIS — R0902 Hypoxemia: Secondary | ICD-10-CM | POA: Diagnosis not present

## 2016-05-15 DIAGNOSIS — J811 Chronic pulmonary edema: Secondary | ICD-10-CM | POA: Diagnosis not present

## 2016-05-15 DIAGNOSIS — J9621 Acute and chronic respiratory failure with hypoxia: Secondary | ICD-10-CM | POA: Diagnosis not present

## 2016-05-15 DIAGNOSIS — D638 Anemia in other chronic diseases classified elsewhere: Secondary | ICD-10-CM | POA: Diagnosis not present

## 2016-05-15 DIAGNOSIS — E8770 Fluid overload, unspecified: Secondary | ICD-10-CM | POA: Diagnosis not present

## 2016-05-15 LAB — CBC WITH DIFFERENTIAL/PLATELET
BASOS ABS: 0 10*3/uL (ref 0.0–0.1)
Band Neutrophils: 0 %
Basophils Relative: 0 %
Blasts: 0 %
EOS PCT: 8 %
Eosinophils Absolute: 1.4 10*3/uL — ABNORMAL HIGH (ref 0.0–0.7)
HCT: 22.1 % — ABNORMAL LOW (ref 39.0–52.0)
Hemoglobin: 7.8 g/dL — ABNORMAL LOW (ref 13.0–17.0)
LYMPHS ABS: 4.2 10*3/uL — AB (ref 0.7–4.0)
Lymphocytes Relative: 24 %
MCH: 32.1 pg (ref 26.0–34.0)
MCHC: 35.3 g/dL (ref 30.0–36.0)
MCV: 90.9 fL (ref 78.0–100.0)
METAMYELOCYTES PCT: 0 %
MONO ABS: 1.8 10*3/uL — AB (ref 0.1–1.0)
MONOS PCT: 10 %
Myelocytes: 0 %
Neutro Abs: 10.2 10*3/uL — ABNORMAL HIGH (ref 1.7–7.7)
Neutrophils Relative %: 58 %
PLATELETS: 398 10*3/uL (ref 150–400)
Promyelocytes Absolute: 0 %
RBC: 2.43 MIL/uL — ABNORMAL LOW (ref 4.22–5.81)
RDW: 22.3 % — AB (ref 11.5–15.5)
WBC: 17.6 10*3/uL — ABNORMAL HIGH (ref 4.0–10.5)
nRBC: 15 /100 WBC — ABNORMAL HIGH

## 2016-05-15 LAB — COMPREHENSIVE METABOLIC PANEL
ALBUMIN: 4.2 g/dL (ref 3.5–5.0)
ALT: 29 U/L (ref 17–63)
AST: 39 U/L (ref 15–41)
Alkaline Phosphatase: 99 U/L (ref 38–126)
Anion gap: 7 (ref 5–15)
BILIRUBIN TOTAL: 5 mg/dL — AB (ref 0.3–1.2)
BUN: 19 mg/dL (ref 6–20)
CHLORIDE: 107 mmol/L (ref 101–111)
CO2: 23 mmol/L (ref 22–32)
Calcium: 8.7 mg/dL — ABNORMAL LOW (ref 8.9–10.3)
Creatinine, Ser: 1.27 mg/dL — ABNORMAL HIGH (ref 0.61–1.24)
GFR calc Af Amer: >60 mL/min (ref >60)
GFR calc non Af Amer: >60 mL/min (ref >60)
Glucose, Bld: 124 mg/dL — ABNORMAL HIGH (ref 65–99)
POTASSIUM: 4.4 mmol/L (ref 3.5–5.1)
Sodium: 137 mmol/L (ref 135–145)
TOTAL PROTEIN: 7.8 g/dL (ref 6.5–8.1)

## 2016-05-15 MED ORDER — AMBRISENTAN 5 MG PO TABS: 5.0000 mg | ORAL_TABLET | Freq: Every day | ORAL | Status: DC

## 2016-05-15 NOTE — Progress Notes (Signed)
Subjective: A 36 year old gentleman admitted with sickle cell painful crisis and hypoxemia. Patient still requires 15 L of oxygen high flow with Sats in the upper 80s and lower 90s. No fever, no NVD. He reports that he has an appointment at [facility] tomorrow for some procedure. He wants to be asble to make it. Paoin is at 9/10 on Dilaudid PCA and toradol. He has used 46 mg in the last 24 hours with physician assisted doses of 10 mg.  Objective: Vital signs in last 24 hours: Temp:  [97.4 F (36.3 C)-98.1 F (36.7 C)] 97.4 F (36.3 C) (04/14 2342) Pulse Rate:  [85-126] 95 (04/15 0600) Resp:  [13-22] 17 (04/15 0600) BP: (87-133)/(47-103) 123/91 (04/15 0600) SpO2:  [77 %-100 %] 89 % (04/15 0600) FiO2 (%):  [45 %-50 %] 50 % (04/14 2340) Weight:  [90.7 kg (200 lb)] 90.7 kg (200 lb) (04/14 1005) Weight change:     Intake/Output from previous day: 04/14 0701 - 04/15 0700 In: 1210 [I.V.:1210] Out: 900 [Urine:900] Intake/Output this shift: No intake/output data recorded.  General appearance: alert, cooperative, appears stated age and no distress Eyes: conjunctivae/corneas clear. PERRL, EOM's intact. Fundi benign. Resp: clear to auscultation bilaterally Chest wall: no tenderness Cardio: regular rate and rhythm, S1, S2 normal, no murmur, click, rub or gallop GI: soft, non-tender; bowel sounds normal; no masses,  no organomegaly Extremities: extremities normal, atraumatic, no cyanosis or edema Neurologic: Grossly normal  Lab Results:  Recent Labs  05/14/16 1004 05/15/16 0513  WBC 24.0* 17.6*  HGB 8.2* 7.8*  HCT 22.9* 22.1*  PLT 425* 398   BMET  Recent Labs  05/14/16 1004 05/15/16 0513  NA 138 137  K 4.5 4.4  CL 107 107  CO2 20* 23  GLUCOSE 130* 124*  BUN 18 19  CREATININE 2.16* 1.27*  CALCIUM 8.9 8.7*    Studies/Results: Dg Chest Port 1 View  Result Date: 05/14/2016 CLINICAL DATA:  Acute chest pain.  History of sickle cell anemia. EXAM: PORTABLE CHEST 1 VIEW  COMPARISON:  04/29/2016 and prior exams FINDINGS: Cardiomegaly and mild vascular congestion noted. A left Port-A-Cath is present with tip overlying the superior cavoatrial junction. There is no evidence of focal airspace disease, pulmonary edema, suspicious pulmonary nodule/mass, pleural effusion, or pneumothorax. No acute bony abnormalities are identified. Left humeral head AVN again noted. IMPRESSION: Cardiomegaly and mild pulmonary vascular congestion. Electronically Signed   By: Margarette Canada M.D.   On: 05/14/2016 10:23    Medications: I have reviewed the patient's current medications.  Assessment/Plan:  1. Acute on chronic Hypoxia: Patient still hypoxic most likely due to acute crisis as well as known pulmonary HTN with oxygen dependence. We will need to titrate patient down to reasonable levels b elow 6L of oxygen as tolerated.  2. Sickle cell painful crisis: continue IV Toradol as well as Dilaudid PCA with physician assisted dosed for next 24 hours.  3. 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 Continue xarelto.  4.Pulmonary HTN: Continue home medications.  5. Anemia of Chronic disease: patient at baseline. Continue to monitor.  LOS: 1 day   Graycen Degan,[name] 05/15/2016, 7:50 AM

## 2016-05-16 DIAGNOSIS — D649 Anemia, unspecified: Secondary | ICD-10-CM

## 2016-05-16 DIAGNOSIS — D5700 Hb-SS disease with crisis, unspecified: Secondary | ICD-10-CM | POA: Diagnosis not present

## 2016-05-16 DIAGNOSIS — I2781 Cor pulmonale (chronic): Secondary | ICD-10-CM | POA: Diagnosis not present

## 2016-05-16 DIAGNOSIS — Z7901 Long term (current) use of anticoagulants: Secondary | ICD-10-CM | POA: Diagnosis not present

## 2016-05-16 DIAGNOSIS — J449 Chronic obstructive pulmonary disease, unspecified: Secondary | ICD-10-CM | POA: Diagnosis not present

## 2016-05-16 DIAGNOSIS — Z7722 Contact with and (suspected) exposure to environmental tobacco smoke (acute) (chronic): Secondary | ICD-10-CM | POA: Diagnosis not present

## 2016-05-16 DIAGNOSIS — D638 Anemia in other chronic diseases classified elsewhere: Secondary | ICD-10-CM | POA: Diagnosis not present

## 2016-05-16 DIAGNOSIS — I10 Essential (primary) hypertension: Secondary | ICD-10-CM | POA: Diagnosis not present

## 2016-05-16 DIAGNOSIS — Z79899 Other long term (current) drug therapy: Secondary | ICD-10-CM | POA: Diagnosis not present

## 2016-05-16 DIAGNOSIS — E8770 Fluid overload, unspecified: Secondary | ICD-10-CM | POA: Diagnosis not present

## 2016-05-16 DIAGNOSIS — Z86711 Personal history of pulmonary embolism: Secondary | ICD-10-CM | POA: Diagnosis not present

## 2016-05-16 DIAGNOSIS — J9621 Acute and chronic respiratory failure with hypoxia: Secondary | ICD-10-CM | POA: Diagnosis not present

## 2016-05-16 DIAGNOSIS — I2721 Secondary pulmonary arterial hypertension: Secondary | ICD-10-CM

## 2016-05-16 DIAGNOSIS — Z8673 Personal history of transient ischemic attack (TIA), and cerebral infarction without residual deficits: Secondary | ICD-10-CM | POA: Diagnosis not present

## 2016-05-16 DIAGNOSIS — Z96641 Presence of right artificial hip joint: Secondary | ICD-10-CM | POA: Diagnosis not present

## 2016-05-16 DIAGNOSIS — Z9981 Dependence on supplemental oxygen: Secondary | ICD-10-CM | POA: Diagnosis not present

## 2016-05-16 DIAGNOSIS — Q8901 Asplenia (congenital): Secondary | ICD-10-CM | POA: Diagnosis not present

## 2016-05-16 DIAGNOSIS — Z87891 Personal history of nicotine dependence: Secondary | ICD-10-CM | POA: Diagnosis not present

## 2016-05-16 DIAGNOSIS — R0902 Hypoxemia: Secondary | ICD-10-CM | POA: Diagnosis present

## 2016-05-16 DIAGNOSIS — J811 Chronic pulmonary edema: Secondary | ICD-10-CM | POA: Diagnosis not present

## 2016-05-16 DIAGNOSIS — R918 Other nonspecific abnormal finding of lung field: Secondary | ICD-10-CM | POA: Diagnosis not present

## 2016-05-16 DIAGNOSIS — I272 Pulmonary hypertension, unspecified: Secondary | ICD-10-CM | POA: Diagnosis not present

## 2016-05-16 DIAGNOSIS — I4581 Long QT syndrome: Secondary | ICD-10-CM | POA: Diagnosis not present

## 2016-05-16 DIAGNOSIS — Z86718 Personal history of other venous thrombosis and embolism: Secondary | ICD-10-CM | POA: Diagnosis not present

## 2016-05-16 DIAGNOSIS — D72829 Elevated white blood cell count, unspecified: Secondary | ICD-10-CM | POA: Diagnosis not present

## 2016-05-16 DIAGNOSIS — D5701 Hb-SS disease with acute chest syndrome: Secondary | ICD-10-CM | POA: Diagnosis not present

## 2016-05-16 DIAGNOSIS — R002 Palpitations: Secondary | ICD-10-CM | POA: Diagnosis not present

## 2016-05-16 DIAGNOSIS — Z9119 Patient's noncompliance with other medical treatment and regimen: Secondary | ICD-10-CM | POA: Diagnosis not present

## 2016-05-16 LAB — CBC WITH DIFFERENTIAL/PLATELET
BLASTS: 0 %
Band Neutrophils: 3 %
Basophils Absolute: 0 10*3/uL (ref 0.0–0.1)
Basophils Relative: 0 %
EOS ABS: 1.1 10*3/uL — AB (ref 0.0–0.7)
Eosinophils Relative: 7 %
HEMATOCRIT: 21.6 % — AB (ref 39.0–52.0)
HEMOGLOBIN: 7.4 g/dL — AB (ref 13.0–17.0)
LYMPHS PCT: 12 %
Lymphs Abs: 1.8 10*3/uL (ref 0.7–4.0)
MCH: 30.8 pg (ref 26.0–34.0)
MCHC: 34.3 g/dL (ref 30.0–36.0)
MCV: 90 fL (ref 78.0–100.0)
MONOS PCT: 7 %
Metamyelocytes Relative: 0 %
Monocytes Absolute: 1.1 10*3/uL — ABNORMAL HIGH (ref 0.1–1.0)
Myelocytes: 2 %
NEUTROS ABS: 11.3 10*3/uL — AB (ref 1.7–7.7)
NEUTROS PCT: 69 %
NRBC: 0 /100{WBCs}
Other: 0 %
Platelets: 387 10*3/uL (ref 150–400)
Promyelocytes Absolute: 0 %
RBC: 2.4 MIL/uL — AB (ref 4.22–5.81)
RDW: 22.3 % — ABNORMAL HIGH (ref 11.5–15.5)
WBC: 15.3 10*3/uL — ABNORMAL HIGH (ref 4.0–10.5)

## 2016-05-16 LAB — BRAIN NATRIURETIC PEPTIDE: B Natriuretic Peptide: 299.8 pg/mL — ABNORMAL HIGH (ref 0.0–100.0)

## 2016-05-16 LAB — TROPONIN I
TROPONIN I: 0.12 ng/mL — AB (ref <0.03)
TROPONIN I: 0.09 ng/mL — AB (ref <0.03)

## 2016-05-16 LAB — COMPREHENSIVE METABOLIC PANEL
ALT: 25 U/L (ref 17–63)
AST: 36 U/L (ref 15–41)
Albumin: 4.2 g/dL (ref 3.5–5.0)
Alkaline Phosphatase: 112 U/L (ref 38–126)
Anion gap: 6 (ref 5–15)
BILIRUBIN TOTAL: 3.4 mg/dL — AB (ref 0.3–1.2)
BUN: 26 mg/dL — ABNORMAL HIGH (ref 6–20)
CALCIUM: 8.6 mg/dL — AB (ref 8.9–10.3)
CO2: 24 mmol/L (ref 22–32)
CREATININE: 1.05 mg/dL (ref 0.61–1.24)
Chloride: 107 mmol/L (ref 101–111)
Glucose, Bld: 123 mg/dL — ABNORMAL HIGH (ref 65–99)
Potassium: 4 mmol/L (ref 3.5–5.1)
Sodium: 137 mmol/L (ref 135–145)
Total Protein: 7.7 g/dL (ref 6.5–8.1)

## 2016-05-16 LAB — RETICULOCYTES
RBC.: 2.32 MIL/uL — ABNORMAL LOW (ref 4.22–5.81)
Retic Count, Absolute: 225 10*3/uL — ABNORMAL HIGH (ref 19.0–186.0)
Retic Ct Pct: 9.7 % — ABNORMAL HIGH (ref 0.4–3.1)

## 2016-05-16 MED ORDER — CARVEDILOL 3.125 MG PO TABS: 3.1250 mg | ORAL_TABLET | Freq: Two times a day (BID) | ORAL | Status: AC

## 2016-05-16 NOTE — Care Management Note (Signed)
Case Management Note  Patient Details  Name: [name] MRN: [id number] Date of Birth: Jan 04, 1981  Subjective/Objective:        Sickle cell crisis            Action/Plan:Date:  May 16, 2016 Chart reviewed for concurrent status and case management needs. Will continue to follow patient progress. Discharge Planning: following for needs Expected discharge date: [id number] Velva Harman, BSN, [place], [place]   Expected Discharge Date:  05/16/16               Expected Discharge Plan:  Home/Self Care  In-House Referral:     Discharge planning Services     Post Acute Care Choice:    Choice offered to:     DME Arranged:    DME Agency:     HH Arranged:    HH Agency:     Status of Service:  In process, will continue to follow  If discussed at Long Length of Stay Meetings, dates discussed:    Additional Comments:  Leeroy Cha, RN 05/16/2016, 10:48 AM

## 2016-05-16 NOTE — Progress Notes (Addendum)
SICKLE CELL SERVICE PROGRESS NOTE  [name] ERX:[id number] DOB: 1980-10-20 DOA: 05/14/2016 PCP: [name], MD  Assessment/Plan: Principal Problem:   Acute on chronic respiratory failure with hypoxia ([place]) Active Problems:   Chronic anticoagulation   Sickle cell pain crisis ([place])   PAH (pulmonary artery hypertension) ([facility])   Chronic anemia   Respiratory failure, acute and chronic ([place])  1. Hb SS with crisis: This was likely triggered by periods without Oxygen. Continue PCA at current dose. Continue MS contin and add Toradol.  2. Acute on Chronic Respiratory Failure with Hypoxia: Likely related to prolonged Hypoxic state and fluid overload. Continue Supplemental Oxygen to maintain saturations > 87% and continue Diuresis. Check BNP 3. Leukocytosis: Likely related to crisis.  4. Anemia of chronic Disease: Hb at baseline.  5. Chronic pain: Continue MS Contin. 6. 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 He normally uses 4 l/min of Oxygen at home and is taking Letairis for the [facility]. Continue Letairis and Oxygen 7. Chronic Anticoagulation: Pt has had recurrent Pulmonary Embioli and is taking Xarelto chronically. Continue.  8. HTN: BP well controlled.  9. Medication non-compliance: As noted patient is non-adherent to the use of Oxygen and well as with follow up for Sickle Cell Disease. RBC Pheresis for secondary stroke prevention as well as Pulmonary condition. 10. Frequent Hospitalizations: Pt has had multiple hospitalizations in the last month all with sever hypoxia and discharges with resolution of acute component of respiratory failure. Most of the time his condition is triggered by prolonged periods without use of his Oxygen. He also has significant co-morbidities which further complicate the chronic respiratory condition and follow up for those conditions have been sketchy at best. I will consult Palliative care  to establish goals of care and other recommendations regarding treatments in line with patients goals of care.  Code Status: Full Code Family Communication: N/A Disposition Plan: Plan is to transfer to [facility] today.   Iszabella Hebenstreit A.  Pager [phone]. If 7PM-7AM, please contact night-coverage.  05/16/2016, 11:58 AM  LOS: 2 days   Interim History: This is a patient well known to me and on his 4th admission in the last 30 days. He has been ono-compliant with use of portable Oxygen stating that he does not like the portable tank. He was without portable Oxygen for an extended period of time on 2 days in the last week, and started having pain in the left chest wall on the subsequent day. His presentation to the ED was Hypoxia nd pain which has been the typical presentation. He has been counseled on the risk of prolonged periods without Oxygen and althjough he verbalizes understanding, he continues to go without Oxygen. He is followed by the Hematologist and Pulmonologist at [facility] and today had appointments for Hematology clinic as well as for RBC pheresis. He presently reports pain in the left chest wall and left rib-cage. There is a noted slight increase in Troponin from yesterday which is elevated from Troponin from his previous  hospital discharge   Consultants:  None  Procedures:  None  Antibiotics:  None  HPI/Subjective: Pt reports pain is 8/10 in left chest wall and shoulder. He has used 64 mg with 82/80:demands/deliveries of Dilaudid in the last 24 hours.   Objective: Vitals:   05/16/16 0949 05/16/16 1000 05/16/16 1100 05/16/16 1135  BP:  102/62 (!) 105/51   Pulse:  78 79 74  Resp:  14 20 15  Temp:      TempSrc:      SpO2:  100% 98% 95%  Weight: 86.3 kg (190 lb 4.1 oz)     Height:       Weight change:   Intake/Output Summary (Last 24 hours) at 05/16/16 1158 Last data filed at 05/16/16 1000  Gross per 24 hour  Intake            652.5 ml  Output              4900 ml  Net          -4247.5 ml    General: Alert, awake, oriented x3, in visible distress due to pain.  HEENT: [place]/AT PEERL, EOMI, anicteric. Neck: Trachea midline,  no masses, no thyromegal,y no JVD, no carotid bruit OROPHARYNX:  Moist, No exudate/ erythema/lesions.  Heart: Regular rate and rhythm, II/VI Systolicejection murmur, rubs, gallops, PMI non-displaced, no heaves or thrills on palpation.  Lungs: Clear to auscultation, no wheezing or rhonchi noted. No increased vocal fremitus resonant to percussion  Abdomen: Soft, nontender, nondistended, positive bowel sounds, no masses no hepatosplenomegaly noted..  Neuro: No focal neurological deficits noted cranial nerves II through XII grossly intact.  Strength at baseline in bilateral upper and lower extremities. Musculoskeletal: No warm swelling or erythema around joints, no spinal tenderness noted. Psychiatric: Patient alert and oriented x3, good insight and cognition, good recent to remote recall.    Data Reviewed: Basic Metabolic Panel:  Recent Labs Lab 05/14/16 1004 05/15/16 0513 05/16/16 0741  NA 138 137 137  K 4.5 4.4 4.0  CL 107 107 107  CO2 20* 23 24  GLUCOSE 130* 124* 123*  BUN 18 19 26*  CREATININE 2.16* 1.27* 1.05  CALCIUM 8.9 8.7* 8.6*   Liver Function Tests:  Recent Labs Lab 05/14/16 1004 05/15/16 0513 05/16/16 0741  AST 37 39 36  ALT 29 29 25   ALKPHOS 111 99 112  BILITOT 6.4* 5.0* 3.4*  PROT 8.0 7.8 7.7  ALBUMIN 4.3 4.2 4.2   No results for input(s): LIPASE, AMYLASE in the last 168 hours.  Recent Labs Lab 05/14/16 1018  AMMONIA <9*   CBC:  Recent Labs Lab 05/14/16 1004 05/15/16 0513 05/16/16 0741  WBC 24.0* 17.6* 15.3*  NEUTROABS  --  10.2* 11.3*  HGB 8.2* 7.8* 7.4*  HCT 22.9* 22.1* 21.6*  MCV 89.8 90.9 90.0  PLT 425* 398 387   Cardiac Enzymes: No results for input(s): CKTOTAL, CKMB, CKMBINDEX, TROPONINI in the last 168 hours. BNP (last 3 results)  Recent Labs  04/14/16 0500  04/21/16 1139 05/05/16 1206  BNP 315.7* 321.8* 639.0*    ProBNP (last 3 results) No results for input(s): PROBNP in the last 8760 hours.  CBG: No results for input(s): GLUCAP in the last 168 hours.  Recent Results (from the past 240 hour(s))  MRSA PCR Screening     Status: None   Collection Time: 05/14/16  6:35 PM  Result Value Ref Range Status   MRSA by PCR NEGATIVE NEGATIVE Final    Comment:        The GeneXpert MRSA Assay (FDA approved for NASAL specimens only), is one component of a comprehensive MRSA colonization surveillance program. It is not intended to diagnose MRSA infection nor to guide or monitor treatment for MRSA infections.      Studies: Dg Chest 2 View  Result Date: 04/29/2016 CLINICAL DATA:  Acute onset of central chest throbbing and rib pain. Initial encounter. EXAM: CHEST  2 VIEW  COMPARISON:  Chest radiograph performed 04/22/2016 FINDINGS: The lungs are well-aerated. Mild vascular congestion is noted. There is no evidence of focal opacification, pleural effusion or pneumothorax. The heart is enlarged. A left-sided chest port noted ending about the distal SVC. No acute osseous abnormalities are seen. Clips are noted within the right upper quadrant, reflecting prior cholecystectomy. IMPRESSION: Mild vascular congestion and cardiomegaly. Lungs remain grossly clear. Electronically Signed   By: Garald Balding M.D.   On: 04/29/2016 19:53   Dg Chest 2 View  Result Date: 04/22/2016 CLINICAL DATA:  Sickle cell pain crisis, shortness of breath today. History of hypertension. EXAM: CHEST  2 VIEW COMPARISON:  Chest x-rays dated 04/17/2016 and 02/11/2016. FINDINGS: Cardiomegaly is stable. There is central pulmonary vascular congestion. Prominent interstitial markings again noted bilaterally suggesting associated interstitial edema. No evidence of overt alveolar pulmonary edema. No confluent opacity to suggest a developing pneumonia. No pleural effusion or pneumothorax seen.  Left chest wall Port-A-Cath is stable in position with tip at the level of the lower SVC. No acute or suspicious osseous finding. IMPRESSION: Cardiomegaly with central pulmonary vascular congestion and bilateral interstitial edema suggesting volume overload/CHF. No evidence of pneumonia. Electronically Signed   By: Franki Cabot M.D.   On: 04/22/2016 14:14   Ct Angio Chest Pe W Or Wo Contrast  Result Date: 05/06/2016 CLINICAL DATA:  Hypoxia, history of sickle cell EXAM: CT ANGIOGRAPHY CHEST WITH CONTRAST TECHNIQUE: Multidetector CT imaging of the chest was performed using the standard protocol during bolus administration of intravenous contrast. Multiplanar CT image reconstructions and MIPs were obtained to evaluate the vascular anatomy. CONTRAST:  75 cc of Isovue 370 IV COMPARISON:  CXR 04/29/2016, chest CT 03/13/2016 FINDINGS: Cardiovascular: Cardiomegaly. Port catheter tip in the cavoatrial junction. Small pericardial effusion. No acute pulmonary embolus. To the third order branches. No aortic aneurysm or dissection. Mediastinum/Nodes: Mild retrosternal extension of the thyroid gland without focal mass. Trachea and esophagus demonstrate no acute findings. Multiple small prevascular, paratracheal and hilar nodes are again seen without pathologic appearing enlargement. Lungs/Pleura: Perihilar distribution of ground-glass opacities suspicious for mild pulmonary edema or alveolitis secondary to infection or inflammation. No significant pleural effusion or pneumothorax. Upper Abdomen: No acute abnormality in the upper abdomen. 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 Musculoskeletal: Central endplate compressions of several mid thoracic vertebrae consistent with history of sickle cell. No acute osseous appearing abnormality. Review of the MIP images confirms the above findings. IMPRESSION: Stable cardiomegaly with mild perihilar ground-glass opacities suspicious for pulmonary  edema with differential possibilities including alveolitis possibly from an infectious or inflammatory process. No acute pulmonary embolus. Auto infarcted spleen. H- shaped vertebrae in the mid thoracic spine consistent with history of sickle cell. Electronically Signed   By: Ashley Royalty M.D.   On: 05/06/2016 19:11   Dg Chest Port 1 View  Result Date: 05/14/2016 CLINICAL DATA:  Acute chest pain.  History of sickle cell anemia. EXAM: PORTABLE CHEST 1 VIEW COMPARISON:  04/29/2016 and prior exams FINDINGS: Cardiomegaly and mild vascular congestion noted. A left Port-A-Cath is present with tip overlying the superior cavoatrial junction. There is no evidence of focal airspace disease, pulmonary edema, suspicious pulmonary nodule/mass, pleural effusion, or pneumothorax. No acute bony abnormalities are identified. Left humeral head AVN again noted. IMPRESSION: Cardiomegaly and mild pulmonary vascular congestion. Electronically Signed   By: Margarette Canada M.D.   On: 05/14/2016 10:23   Dg Chest Port 1 View  Result Date: 04/17/2016 CLINICAL DATA:  Lethargy.  Shortness of breath. EXAM: PORTABLE CHEST 1 VIEW COMPARISON:  None. FINDINGS: Stable enlarged cardiac silhouette. Increased density at the left lateral lung base. Interval mild patchy and linear opacity at the right lung base. Left subclavian porta catheter tip in the right atrium. No acute bony abnormality. 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 IMPRESSION: 1. Interval atelectasis or pneumonia at the left lateral lung base. 2. Interval mild atelectasis and possible pneumonia at the right lung base. 3. Stable cardiomegaly. 4. Left subclavian porta catheter tip in the right atrium. This could be retracted 4.5 cm to place it in the superior vena cava. Electronically Signed   By: Claudie Revering M.D.   On: 04/17/2016 09:06    Scheduled Meds: . ambrisentan  5 mg Oral Daily  . aspirin  81 mg Oral Daily  . carvedilol  3.125 mg Oral BID WC  .  cholecalciferol  2,000 Units Oral Daily  . folic acid  2 mg Oral Daily  . gabapentin  300 mg Oral TID  . HYDROmorphone   Intravenous Q4H  . hydroxyurea  1,000 mg Oral Q48H  . hydroxyurea  500 mg Oral Q48H  . L-glutamine  15 g Oral BID  . morphine  30 mg Oral Q12H  . potassium chloride SA  40 mEq Oral Daily  . rivaroxaban  20 mg Oral QHS  . senna-docusate  1 tablet Oral BID  . torsemide  20 mg Oral Daily   Continuous Infusions: . dextrose 5 % and 0.45% NaCl 10 mL/hr at 05/16/16 0945    Principal Problem:   Acute on chronic respiratory failure with hypoxia ([facility]) Active Problems:   Chronic anticoagulation   Sickle cell pain crisis ([facility])   PAH (pulmonary artery hypertension) ([facility])   Chronic anemia   Respiratory failure, acute and chronic ([facility])    In excess of 45 minutes spent during this visit. In excess of 50% involved face to face contact with the patient for assessment, counseling and coordination of care.

## 2016-05-16 NOTE — Progress Notes (Signed)
05/16/16  1810  Called report to [facility]     [place] [phone] spoke with Tressie Ellis, RN

## 2016-05-16 NOTE — Discharge Summary (Signed)
[name] MRN: [id number] DOB/AGE: 37-Oct-1982 37 y.o.  Admit date: 05/14/2016 Discharge date: 05/16/2016  Primary Care Physician:  [name], MD   Discharge Diagnoses:   Patient Active Problem List   Diagnosis Date Noted  . Cor pulmonale, chronic ([place]) 08/25/2014    Priority: High  . Pulmonary HTN ([place]) 06/18/2013    Priority: High  . Functional asplenia     Priority: High  . Acute on chronic respiratory failure with hypoxia (HCC)     Priority: Medium  . Anemia of chronic disease 06/25/2014    Priority: Medium  . Chronic respiratory failure with hypoxia ([place]) 03/14/2014    Priority: Medium  . Chronic anticoagulation 08/22/2013    Priority: Medium  . Avascular necrosis ([place])     Priority: Low  . Respiratory failure, acute and chronic ([place]) 05/14/2016  . Acute pulmonary edema ([place]) 04/11/2016  . H/O arterial ischemic stroke 11/11/2015  . Slurred speech 11/10/2015  . Chronic anemia   . Hb-SS disease without crisis ([place]) 10/07/2014  . Prolonged Q-T interval on ECG 09/10/2014  . Troponin level elevated 08/29/2014  . Sickle cell anemia ([place]) 06/25/2014  . PAH (pulmonary artery hypertension) ([place]) 03/18/2014  . Paralytic strabismus, external ophthalmoplegia   . Chronic pain syndrome 12/12/2013  . Essential hypertension 08/22/2013  . Vitamin D deficiency 02/13/2013  . Sickle cell crisis ([place]) 01/13/2013  . Rib pain 01/13/2013  . Hx of pulmonary embolus 06/29/2012  . 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 Sickle cell pain crisis ([place])     DISCHARGE MEDICATION: Allergies as of 05/16/2016   No Known Allergies     Medication List    STOP taking these medications   furosemide 20 MG tablet Commonly known as:  LASIX     TAKE these medications   ambrisentan 5 MG tablet Commonly known as:  LETAIRIS Take 5 mg by mouth daily.   aspirin 81 MG chewable tablet Chew 1 tablet (81 mg total) by mouth daily.   carvedilol 3.125 MG tablet Commonly known as:   COREG Take 1 tablet (3.125 mg total) by mouth 2 (two) times daily with a meal. Start taking on:  05/17/2016   cholecalciferol 1000 units tablet Commonly known as:  VITAMIN D Take 2,000 Units by mouth daily.   folic acid 1 MG tablet Commonly known as:  FOLVITE Take 2 tablets (2 mg total) by mouth daily.   gabapentin 300 MG capsule Commonly known as:  NEURONTIN Take 1 capsule (300 mg total) by mouth 3 (three) times daily.   HYDROmorphone 4 MG tablet Commonly known as:  DILAUDID Take 2 tablets (8 mg total) by mouth every 4 (four) hours as needed for moderate pain or severe pain.   hydroxyurea 500 MG capsule Commonly known as:  HYDREA Take 1000 mg QOD and alternate with 500 mg QOD. May take with food to minimize GI side effects. What changed:  how much to take  how to take this  when to take this  additional instructions   L-glutamine 5 g Pack Powder Packet Commonly known as:  ENDARI Take 15 g by mouth 2 (two) times daily.   lisinopril 10 MG tablet Commonly known as:  PRINIVIL,ZESTRIL Take 10 mg by mouth daily.   morphine 30 MG 12 hr tablet Commonly known as:  MS CONTIN Take 1 tablet (30 mg total) by mouth every 12 (twelve) hours.   potassium chloride SA 20 MEQ tablet Commonly known as:  K-DUR,KLOR-CON Take 2 tablets (40 mEq total) by mouth  daily.   rivaroxaban 20 MG Tabs tablet Commonly known as:  XARELTO Take 1 tablet (20 mg total) by mouth at bedtime.   torsemide 20 MG tablet Commonly known as:  DEMADEX Take 1 tablet (20 mg total) by mouth daily.   zolpidem 10 MG tablet Commonly known as:  AMBIEN Take 1 tablet (10 mg total) by mouth at bedtime as needed for sleep.         Consults:    SIGNIFICANT DIAGNOSTIC STUDIES:  Dg Chest 2 View  Result Date: 04/29/2016 CLINICAL DATA:  Acute onset of central chest throbbing and rib pain. Initial encounter. EXAM: CHEST  2 VIEW COMPARISON:  Chest radiograph performed 04/22/2016 FINDINGS: The lungs are  well-aerated. Mild vascular congestion is noted. There is no evidence of focal opacification, pleural effusion or pneumothorax. The heart is enlarged. A left-sided chest port noted ending about the distal SVC. No acute osseous abnormalities are seen. Clips are noted within the right upper quadrant, reflecting prior cholecystectomy. IMPRESSION: Mild vascular congestion and cardiomegaly. Lungs remain grossly clear. Electronically Signed   By: Garald Balding M.D.   On: 04/29/2016 19:53   Dg Chest 2 View  Result Date: 04/22/2016 CLINICAL DATA:  Sickle cell pain crisis, shortness of breath today. History of hypertension. EXAM: CHEST  2 VIEW COMPARISON:  Chest x-rays dated 04/17/2016 and 02/11/2016. FINDINGS: Cardiomegaly is stable. There is central pulmonary vascular congestion. Prominent interstitial markings again noted bilaterally suggesting associated interstitial edema. No evidence of overt alveolar pulmonary edema. No confluent opacity to suggest a developing pneumonia. No pleural effusion or pneumothorax seen. Left chest wall Port-A-Cath is stable in position with tip at the level of the lower SVC. No acute or suspicious osseous finding. IMPRESSION: Cardiomegaly with central pulmonary vascular congestion and bilateral interstitial edema suggesting volume overload/CHF. No evidence of pneumonia. Electronically Signed   By: Franki Cabot M.D.   On: 04/22/2016 14:14   Ct Angio Chest Pe W Or Wo Contrast  Result Date: 05/06/2016 CLINICAL DATA:  Hypoxia, history of sickle cell EXAM: CT ANGIOGRAPHY CHEST WITH CONTRAST TECHNIQUE: Multidetector CT imaging of the chest was performed using the standard protocol during bolus administration of intravenous contrast. Multiplanar CT image reconstructions and MIPs were obtained to evaluate the vascular anatomy. CONTRAST:  75 cc of Isovue 370 IV COMPARISON:  CXR 04/29/2016, chest CT 03/13/2016 FINDINGS: Cardiovascular: Cardiomegaly. Port catheter tip in the cavoatrial  junction. Small pericardial effusion. No acute pulmonary embolus. To the third order branches. No aortic aneurysm or dissection. Mediastinum/Nodes: Mild retrosternal extension of the thyroid gland without focal mass. Trachea and esophagus demonstrate no acute findings. Multiple small prevascular, paratracheal and hilar nodes are again seen without pathologic appearing enlargement. Lungs/Pleura: Perihilar distribution of ground-glass opacities suspicious for mild pulmonary edema or alveolitis secondary to infection or inflammation. No significant pleural effusion or pneumothorax. Upper Abdomen: No acute abnormality in the upper abdomen. 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 Musculoskeletal: Central endplate compressions of several mid thoracic vertebrae consistent with history of sickle cell. No acute osseous appearing abnormality. Review of the MIP images confirms the above findings. IMPRESSION: Stable cardiomegaly with mild perihilar ground-glass opacities suspicious for pulmonary edema with differential possibilities including alveolitis possibly from an infectious or inflammatory process. No acute pulmonary embolus. Auto infarcted spleen. H- shaped vertebrae in the mid thoracic spine consistent with history of sickle cell. Electronically Signed   By: Ashley Royalty M.D.   On: 05/06/2016 19:11   Dg Chest [facility]  Result Date: 05/14/2016 CLINICAL DATA:  Acute chest pain.  History of sickle cell anemia. EXAM: PORTABLE CHEST 1 VIEW COMPARISON:  04/29/2016 and prior exams FINDINGS: Cardiomegaly and mild vascular congestion noted. A left Port-A-Cath is present with tip overlying the superior cavoatrial junction. There is no evidence of focal airspace disease, pulmonary edema, suspicious pulmonary nodule/mass, pleural effusion, or pneumothorax. No acute bony abnormalities are identified. Left humeral head AVN again noted. IMPRESSION: Cardiomegaly and mild pulmonary  vascular congestion. Electronically Signed   By: Margarette Canada M.D.   On: 05/14/2016 10:23   Dg Chest Port 1 View  Result Date: 04/17/2016 CLINICAL DATA:  Lethargy.  Shortness of breath. EXAM: PORTABLE CHEST 1 VIEW COMPARISON:  None. FINDINGS: Stable enlarged cardiac silhouette. Increased density at the left lateral lung base. Interval mild patchy and linear opacity at the right lung base. Left subclavian porta catheter tip in the right atrium. No acute bony abnormality. 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 IMPRESSION: 1. Interval atelectasis or pneumonia at the left lateral lung base. 2. Interval mild atelectasis and possible pneumonia at the right lung base. 3. Stable cardiomegaly. 4. Left subclavian porta catheter tip in the right atrium. This could be retracted 4.5 cm to place it in the superior vena cava. Electronically Signed   By: Claudie Revering M.D.   On: 04/17/2016 09:06     ECHO:01/06/2016 [facility])   Conclusions Summary Technically adequate study Dilated right atrium and right ventricle with abnormal right ventricular systolic function consistent with acute strain on the right ventricle such as pulmonary embolus Other chambers appear structurally normal Normal LV systolic function , flattened intraventricular septum consistent with pressure overload on the right side, normal LV systolic ejection fraction calculated at 68% Valves appear structurally normal mild mitral regurgitation Moderate to severe tricuspid regurgitation with moderate pulmonary hypertension right ventricular systolic pressure estimated of 60 mmHg No pericardial effusion The findings are consistent with acute/ recent pulmonary embolus    Recent Results (from the past 240 hour(s))  MRSA PCR Screening     Status: None   Collection Time: 05/14/16  6:35 PM  Result Value Ref Range Status   MRSA by PCR NEGATIVE NEGATIVE Final    Comment:        The GeneXpert MRSA Assay (FDA approved  for NASAL specimens only), is one component of a comprehensive MRSA colonization surveillance program. It is not intended to diagnose MRSA infection nor to guide or monitor treatment for MRSA infections.     BRIEF ADMITTING H & P:  Patient is a 36 year old gentleman well-known to our service with known history of sickle cell disease and pulmonary hypertension who presented to the ER by EMS complaining of shortness of breath with chest pain. Chest pain involving left shoulder left-sided area. His been going on for 4 hours. The pain was getting worse with sharp and intermittent. No nausea vomiting no diaphoresis. The pain is not relieved by anything including his regular home medication. Patient believed he is having recurrence of his sickle cell crisis. He was found to be hypoxic with oxygen sat of 83% on 3 L which is supposed to be his home dose. Patient is known to be noncompliant with his oxygen. He is oxygen dependent but only uses it at night. He has multiple other medical problems including being a former smoker with some COPD symptoms. He is currently on nonrebreather bag with oxygen sats in the upper 90s. We will admit him therefore for father treatment.  Hospital Course:  Present on Admission: . Acute on chronic respiratory failure with hypoxia ([place]) . Chronic anemia . PAH (pulmonary artery hypertension) ([place]) . Sickle cell pain crisis ([place]) . 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 On admission his Oxygen requirement was 100% venturi mask and this has now been weaned down to 50% FiO2 on 12 L/minute Oxygen after diuresis. CXR showed cardiomegaly and mild  pulmonary edema. BNP is pending from today. He was assessed to be in pulmonary edema and thus was continued on Torsemide. Pt had a decrease in weight from 90.7 kg to 86.3 kg in a 48 hour period. Patient was continued on Letairis for treatment of pulmonary Hypertension and needs to have [place] re-evaluated by pulmonologist as he has missed several Pulmonary follow-up appointments. His most recent ECHO was performed at [facility] in [facility] with findings noted above.  With regard to his Sickle Cell Crisis, he was managed with Dilaudid via PCA with settings of PCA bolus of 0.6 gm , lockout of 10 minutes and 1 hour limit of 3 mg. He was also continued on MS contin and treated additionally with Toradol. His pain is still not controlled however I suspect that he has a large burden of % of Hb S made worse by his state of hypoxemia.   With regard to the Anemia of Chronic Disease he has a baseline Hb of 6.5-7.0 g/dL.   Pt has a h/o CVA (most recent 11/10/2015) and was receiving serial RBC Pheresis fro secondary prophylaxis. However he has not had one performed since his last CVA in October. He was scheduled RBC pheresis today but was admitted for acute medical issues.   He has had recurrent Pulmonary Emboli and has been taking Xarelto. Pt endorses adherence to Xarelto and has had multiple CTA in the last year which have not changed the course of therapy. He was continued on Xarelto but CTA was not performed.   Pt also has prolonged QTc without any inciting medications. Thus compazine is recommended for anti-emetic instead of Zofran.   He does have a H/O essential HTN and his BP has been stable.   He is chronically on n Hydrea and Endari for chronic management of Sickle Cell Disease. Both of these medications were continued during hospitalization.    Disposition and Follow-up:  Pt is hemodynamically stable and is to be transferred to [facility] to the care of Dr. Seward Grater. He will receive care from  the specialists who care for patient's chronic disease and anticipates RBC pheresis which is currently not available.    DISCHARGE EXAM:  General: Alert, awake, oriented x3, in mild distress.  HEENT: [place]/AT PEERL, EOMI, anicteric Neck: Trachea midline, no masses, no thyromegal,y no JVD, no carotid bruit OROPHARYNX: Moist, No exudate/ erythema/lesions.  Heart: Regular rate and rhythm, II/VI SEM at base. Loud P2 component.  No rubs, gallops or S3. PMI non-displaced. Exam reveals no decreased pulses. Pulmonary/Chest: Normal effort. Breath sounds normal. No. Apnea. Clear to auscultation,no stridor,  no wheezing and no rhonchi noted. No respiratory distress and no tenderness noted. Abdomen: Soft, nontender, nondistended, normal bowel sounds, no masses no hepatosplenomegaly noted. No fluid wave and no ascites. There is no guarding or  rebound. Neuro: Alert and oriented to person, place and time. Normal motor skills, Displays no atrophy or tremors and exhibits normal muscle tone.  No focal neurological deficits noted cranial nerves II through XII grossly intact. No sensory deficit noted. Strength at baseline in bilateral upper and lower extremities. Pt has slurred speech at baseline. Gait at baseline. Musculoskeletal: No warmth swelling or erythema around joints, no spinal tenderness noted. Psychiatric: Patient alert and oriented x3, good insight and cognition, good recent to remote recall. Skin: Skin is warm and dry. No bruising, no ecchymosis and no rash noted. Pt is not diaphoretic. No erythema. No pallor   Blood pressure 116/69, pulse 79, temperature 97.7 F (36.5 C), temperature source Oral, resp. rate 12, height 6' (1.829 m), weight 86.3 kg (190 lb 4.1 oz), SpO2 98 %.   Recent Labs  05/15/16 0513 05/16/16 0741  NA 137 137  K 4.4 4.0  CL 107 107  CO2 23 24  GLUCOSE 124* 123*  BUN 19 26*  CREATININE 1.27* 1.05  CALCIUM 8.7* 8.6*    Recent Labs  05/15/16 0513 05/16/16 0741  AST  39 36  ALT 29 25  ALKPHOS 99 112  BILITOT 5.0* 3.4*  PROT 7.8 7.7  ALBUMIN 4.2 4.2   No results for input(s): LIPASE, AMYLASE in the last 72 hours.  Recent Labs  05/15/16 0513 05/16/16 0741  WBC 17.6* 15.3*  NEUTROABS 10.2* 11.3*  HGB 7.8* 7.4*  HCT 22.1* 21.6*  MCV 90.9 90.0  PLT 398 387     Total time spent including face to face and decision making was greater than 30 minutes  Signed: Waymond Meador A. 05/16/2016, 4:54 PM

## 2016-05-16 NOTE — Progress Notes (Signed)
CRITICAL VALUE ALERT  Critical value received:  Troponin 0.12  Date of notification:  05/16/16  Time of notification:  1338  Critical value read back: Yes  Nurse who received alert:  [facility]  MD notified (1st page):  Zigmund Daniel  Time of first page:  77  MD notified (2nd page):  Time of second page:  Responding MD:    Time MD responded:

## 2016-05-17 ENCOUNTER — Inpatient Hospital Stay (HOSPITAL_COMMUNITY): Admit: 2016-05-17 | Payer: Self-pay

## 2016-05-17 ENCOUNTER — Other Ambulatory Visit: Payer: Self-pay

## 2016-05-17 DIAGNOSIS — J9621 Acute and chronic respiratory failure with hypoxia: Secondary | ICD-10-CM | POA: Diagnosis not present

## 2016-05-17 DIAGNOSIS — D571 Sickle-cell disease without crisis: Secondary | ICD-10-CM | POA: Diagnosis not present

## 2016-05-17 DIAGNOSIS — D5700 Hb-SS disease with crisis, unspecified: Secondary | ICD-10-CM | POA: Diagnosis not present

## 2016-05-17 NOTE — Patient Outreach (Signed)
[facility]) Care Management  05/17/2016  [name] [birth year] [id number]   Screening call made to patient due to ED utilization.  HIPAA identifiers confirmed. Description of [facility] services provided. (Patient has been contacted multiple times in the past.)  Patient declined [facility] services.  He states he goes to ED or Urgent Care due to sickle cell pain crises.  RN will mail pamphlet and notify PCP. RN will notify CMA of case closure.  Candie Mile, RN, MSN [place] [phone] Fax [phone]

## 2016-05-18 ENCOUNTER — Inpatient Hospital Stay: Payer: Self-pay | Admitting: Internal Medicine

## 2016-05-19 IMAGING — CR DG CHEST 2V
2 series · 2 of 2 positions shown · non-contrast
Comparison: Chest radiograph performed 06/18/2013

CLINICAL DATA: Sickle cell disease.  Mid upper chest pain.

EXAM:
CHEST  2 VIEW

[w chest pa]
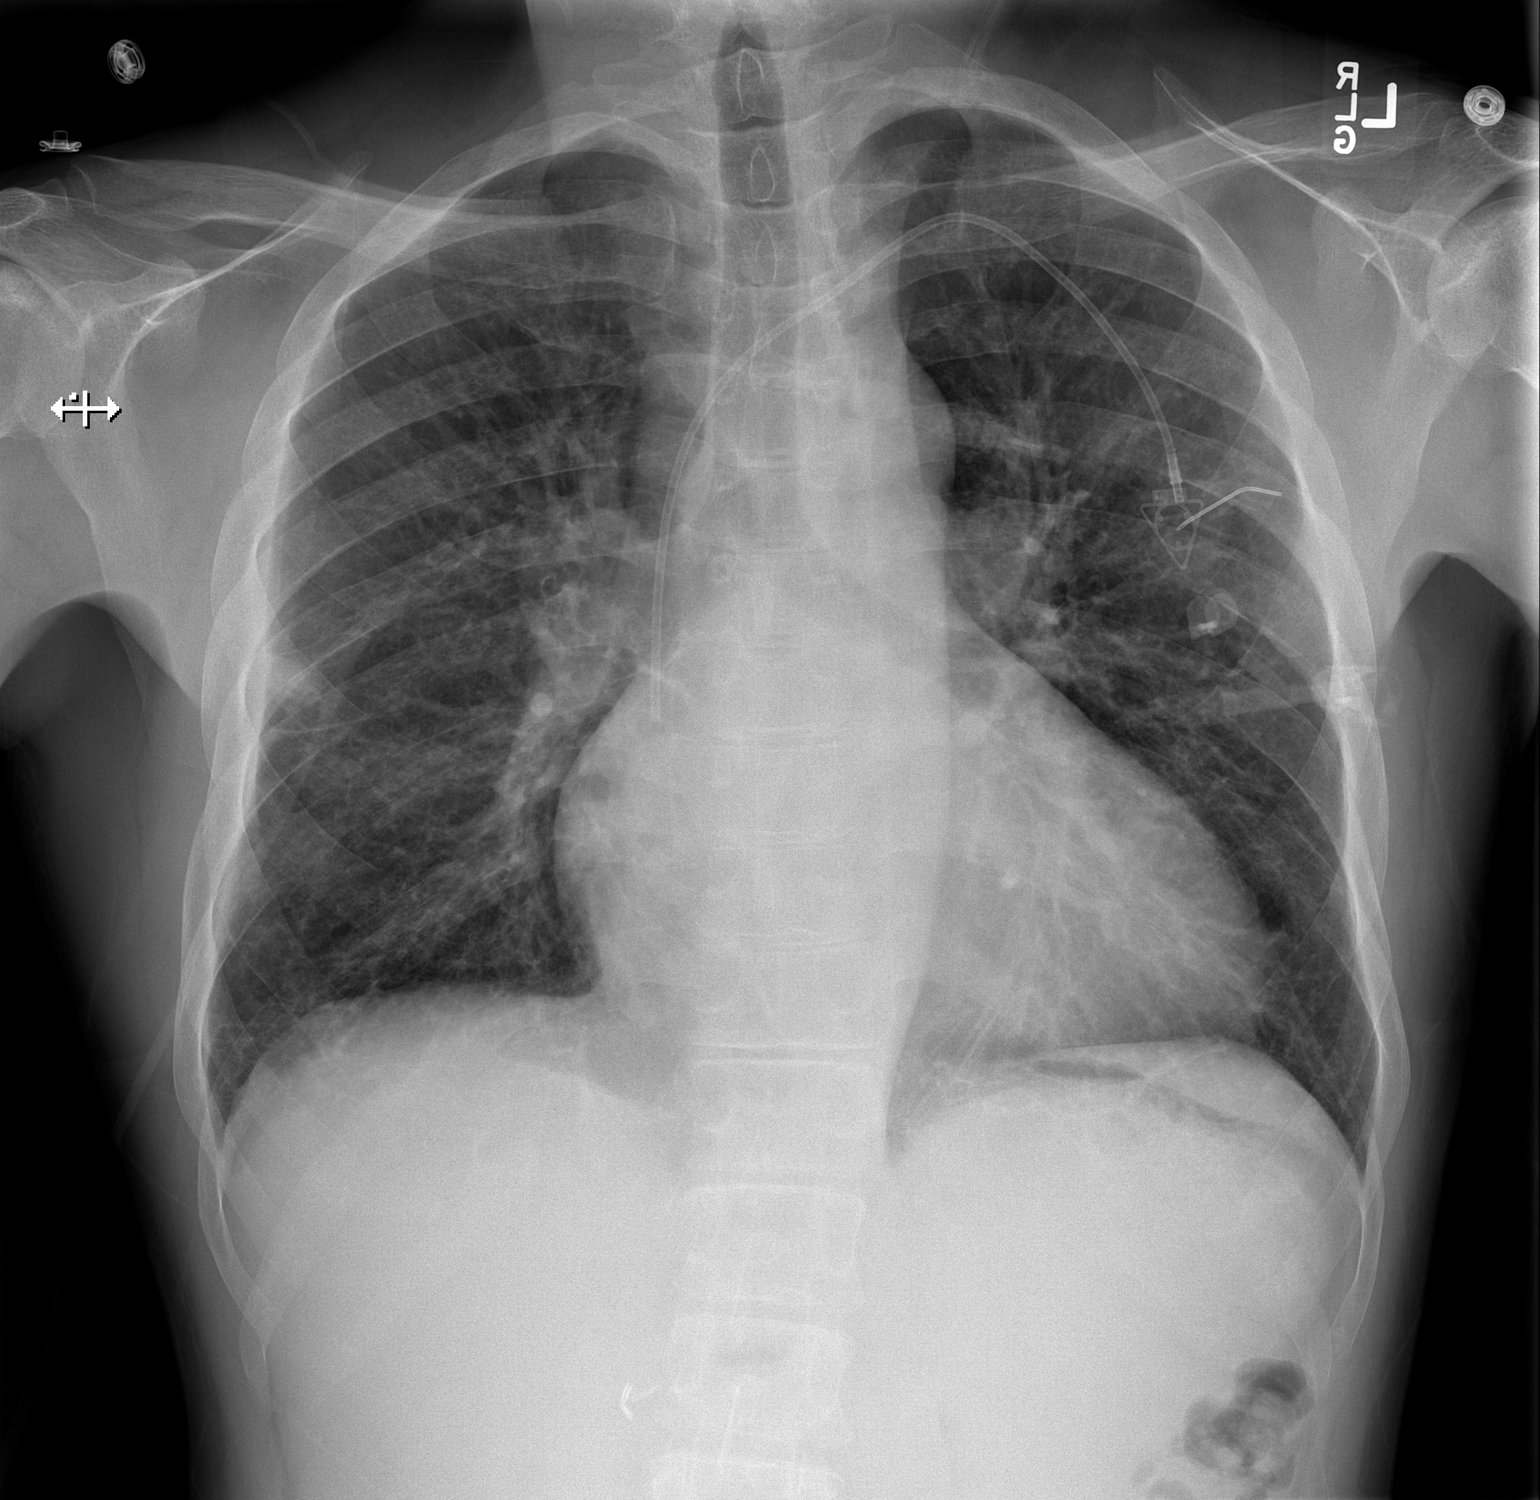

[w chest lat]
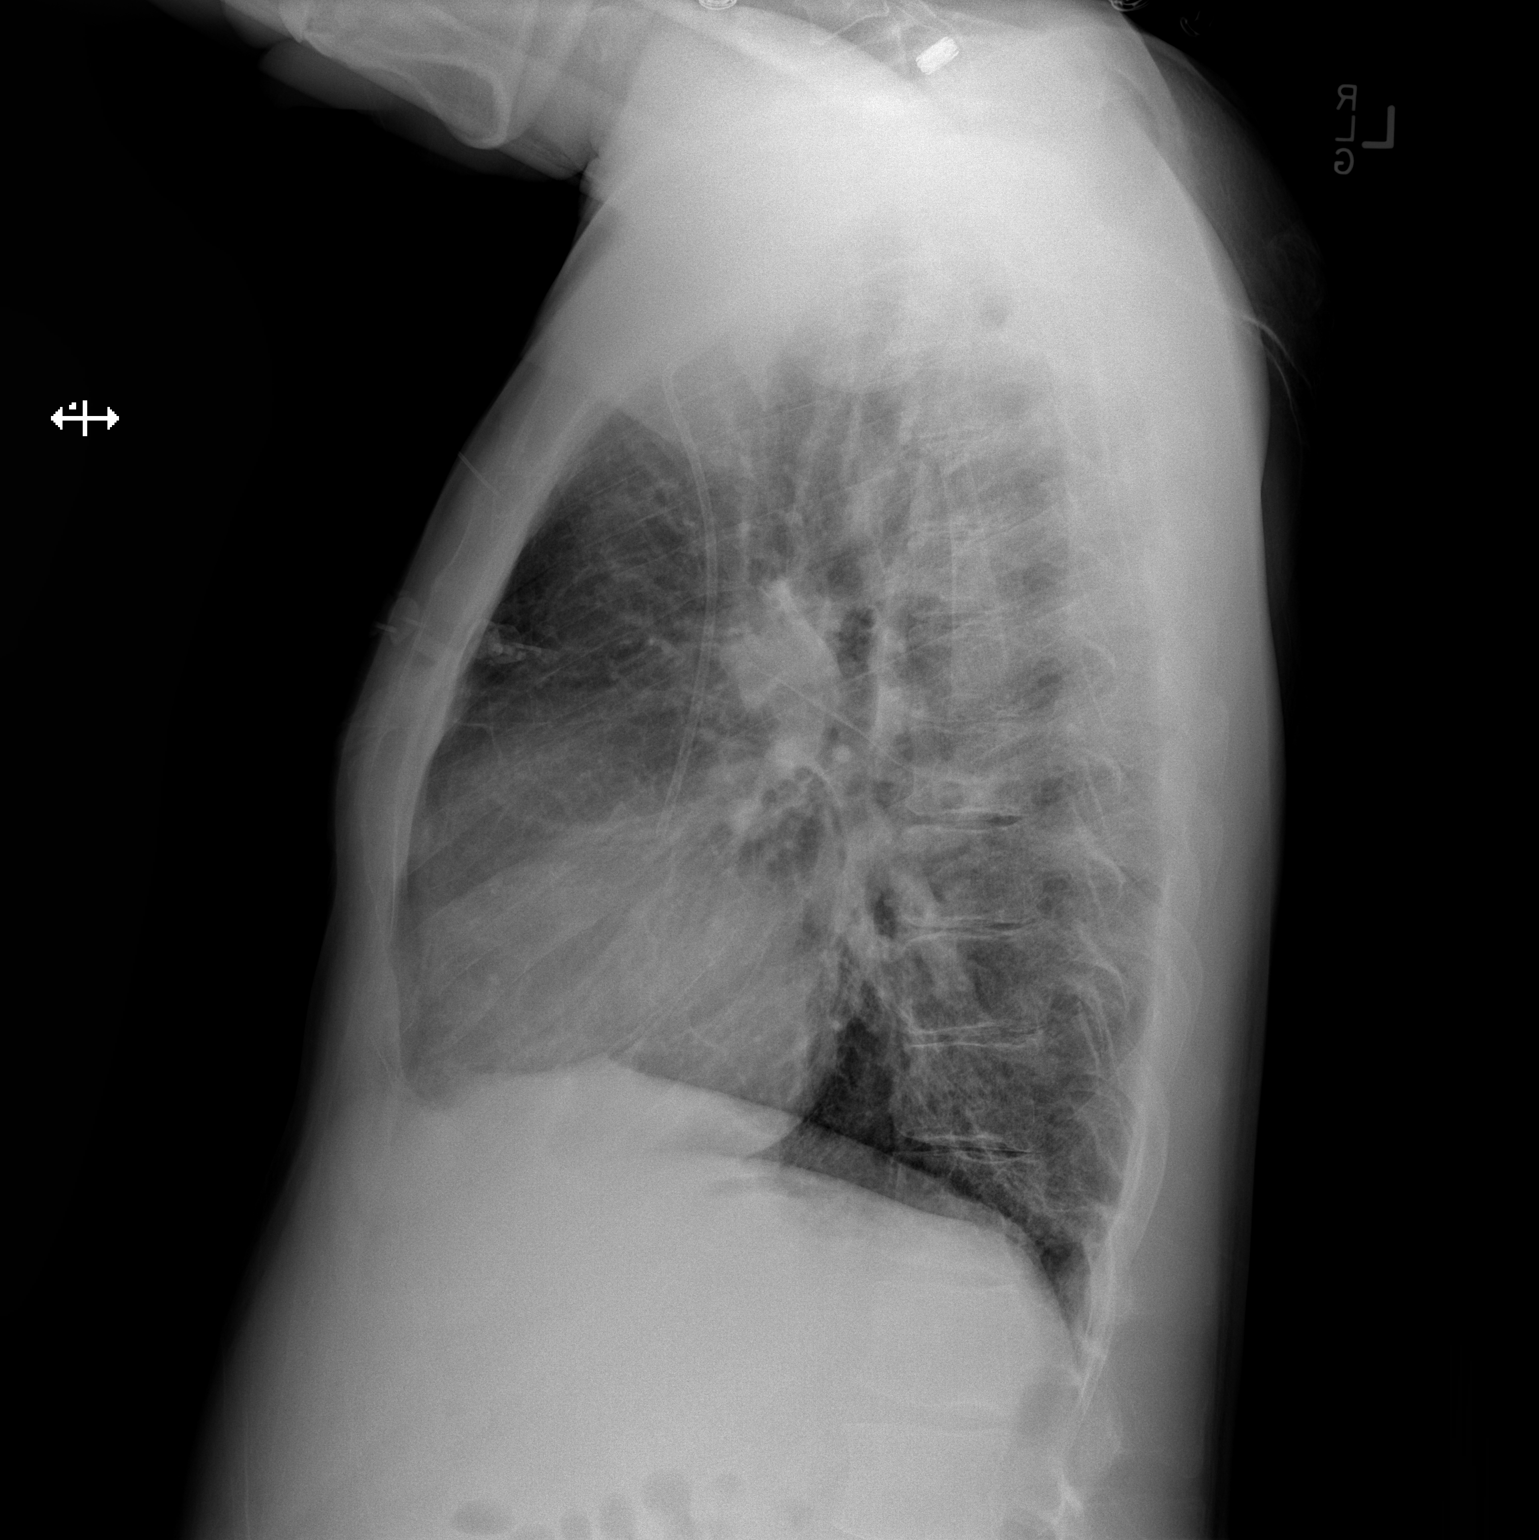

[2 of 2 positions shown; findings below may reference images not displayed]

FINDINGS: The lungs are well-aerated. Mild vascular congestion is noted. A
small peripheral focus of airspace opacity at the right midlung zone
is again seen. This may reflect infection or possibly changes of
acute chest syndrome. There is no evidence of pleural effusion or
pneumothorax.

The heart is mildly enlarged. A left-sided chest port is noted
ending about the distal SVC. No acute osseous abnormalities are
seen. Clips are noted within the right upper quadrant, reflecting
prior cholecystectomy.
IMPRESSION: Mild vascular congestion and mild cardiomegaly noted. Small
peripheral focus of airspace opacity again noted at the right
midlung zone, which may reflect infection or possibly changes of
acute chest syndrome. This is slightly less prominent than on the
prior study.

## 2016-05-19 MED FILL — Hydromorphone HCl Inj 2 MG/ML: INTRAMUSCULAR | Qty: 1 | Status: AC

## 2016-05-24 ENCOUNTER — Other Ambulatory Visit: Payer: Self-pay | Admitting: Family Medicine

## 2016-05-24 DIAGNOSIS — Z7901 Long term (current) use of anticoagulants: Secondary | ICD-10-CM

## 2016-05-24 MED ORDER — RIVAROXABAN 20 MG PO TABS: 20.0000 mg | ORAL_TABLET | Freq: Every day | ORAL | 3 refills | Status: DC

## 2016-05-28 ENCOUNTER — Encounter (HOSPITAL_COMMUNITY): Payer: Self-pay | Admitting: Emergency Medicine

## 2016-05-28 ENCOUNTER — Emergency Department (HOSPITAL_COMMUNITY): Payer: Medicare Other

## 2016-05-28 ENCOUNTER — Emergency Department (HOSPITAL_COMMUNITY)
Admission: EM | Admit: 2016-05-28 | Discharge: 2016-05-28 | Disposition: A | Discharge status: Home / Self Care | Payer: Medicare Other | Source: Home / Self Care | Attending: Emergency Medicine | Admitting: Emergency Medicine

## 2016-05-28 DIAGNOSIS — J449 Chronic obstructive pulmonary disease, unspecified: Secondary | ICD-10-CM | POA: Diagnosis present

## 2016-05-28 DIAGNOSIS — Z8673 Personal history of transient ischemic attack (TIA), and cerebral infarction without residual deficits: Secondary | ICD-10-CM | POA: Insufficient documentation

## 2016-05-28 DIAGNOSIS — D5780 Other sickle-cell disorders without crisis: Secondary | ICD-10-CM | POA: Insufficient documentation

## 2016-05-28 DIAGNOSIS — J9611 Chronic respiratory failure with hypoxia: Secondary | ICD-10-CM | POA: Diagnosis present

## 2016-05-28 DIAGNOSIS — Z79891 Long term (current) use of opiate analgesic: Secondary | ICD-10-CM

## 2016-05-28 DIAGNOSIS — I272 Pulmonary hypertension, unspecified: Secondary | ICD-10-CM | POA: Diagnosis present

## 2016-05-28 DIAGNOSIS — Z7901 Long term (current) use of anticoagulants: Secondary | ICD-10-CM | POA: Insufficient documentation

## 2016-05-28 DIAGNOSIS — D72829 Elevated white blood cell count, unspecified: Secondary | ICD-10-CM | POA: Diagnosis not present

## 2016-05-28 DIAGNOSIS — I10 Essential (primary) hypertension: Secondary | ICD-10-CM

## 2016-05-28 DIAGNOSIS — D5700 Hb-SS disease with crisis, unspecified: Principal | ICD-10-CM | POA: Diagnosis present

## 2016-05-28 DIAGNOSIS — Z9981 Dependence on supplemental oxygen: Secondary | ICD-10-CM | POA: Diagnosis not present

## 2016-05-28 DIAGNOSIS — Z87891 Personal history of nicotine dependence: Secondary | ICD-10-CM

## 2016-05-28 DIAGNOSIS — I4581 Long QT syndrome: Secondary | ICD-10-CM | POA: Diagnosis present

## 2016-05-28 DIAGNOSIS — D571 Sickle-cell disease without crisis: Secondary | ICD-10-CM | POA: Diagnosis not present

## 2016-05-28 DIAGNOSIS — Z86711 Personal history of pulmonary embolism: Secondary | ICD-10-CM | POA: Diagnosis not present

## 2016-05-28 DIAGNOSIS — Z79899 Other long term (current) drug therapy: Secondary | ICD-10-CM

## 2016-05-28 DIAGNOSIS — D649 Anemia, unspecified: Secondary | ICD-10-CM

## 2016-05-28 DIAGNOSIS — R0781 Pleurodynia: Secondary | ICD-10-CM | POA: Diagnosis not present

## 2016-05-28 DIAGNOSIS — G894 Chronic pain syndrome: Secondary | ICD-10-CM | POA: Diagnosis not present

## 2016-05-28 LAB — CBC WITH DIFFERENTIAL/PLATELET
Basophils Absolute: 0.1 10*3/uL (ref 0.0–0.1)
Basophils Relative: 1 %
EOS ABS: 0.2 10*3/uL (ref 0.0–0.7)
Eosinophils Relative: 2 %
HEMATOCRIT: 21.4 % — AB (ref 39.0–52.0)
HEMOGLOBIN: 7.4 g/dL — AB (ref 13.0–17.0)
LYMPHS PCT: 14 %
Lymphs Abs: 1.5 10*3/uL (ref 0.7–4.0)
MCH: 30.1 pg (ref 26.0–34.0)
MCHC: 34.6 g/dL (ref 30.0–36.0)
MCV: 87 fL (ref 78.0–100.0)
MONO ABS: 1.6 10*3/uL — AB (ref 0.1–1.0)
Monocytes Relative: 15 %
Neutro Abs: 7.7 10*3/uL (ref 1.7–7.7)
Neutrophils Relative %: 68 %
Platelets: 553 10*3/uL — ABNORMAL HIGH (ref 150–400)
RBC: 2.46 MIL/uL — ABNORMAL LOW (ref 4.22–5.81)
RDW: 19.2 % — ABNORMAL HIGH (ref 11.5–15.5)
WBC: 11.1 10*3/uL — AB (ref 4.0–10.5)

## 2016-05-28 LAB — COMPREHENSIVE METABOLIC PANEL
ALK PHOS: 85 U/L (ref 38–126)
ALT: 20 U/L (ref 17–63)
AST: 37 U/L (ref 15–41)
Albumin: 4.2 g/dL (ref 3.5–5.0)
Anion gap: 7 (ref 5–15)
BILIRUBIN TOTAL: 3.8 mg/dL — AB (ref 0.3–1.2)
BUN: 25 mg/dL — ABNORMAL HIGH (ref 6–20)
CALCIUM: 8.9 mg/dL (ref 8.9–10.3)
CO2: 23 mmol/L (ref 22–32)
Chloride: 107 mmol/L (ref 101–111)
Creatinine, Ser: 1.13 mg/dL (ref 0.61–1.24)
Glucose, Bld: 105 mg/dL — ABNORMAL HIGH (ref 65–99)
Potassium: 3.9 mmol/L (ref 3.5–5.1)
Sodium: 137 mmol/L (ref 135–145)
TOTAL PROTEIN: 7.8 g/dL (ref 6.5–8.1)

## 2016-05-28 MED ORDER — HYDROMORPHONE HCL 1 MG/ML IJ SOLN: 2.0000 mg | INTRAMUSCULAR | Status: AC

## 2016-05-28 MED ORDER — DIPHENHYDRAMINE HCL 50 MG/ML IJ SOLN
25.0000 mg | Freq: Once | INTRAMUSCULAR | Status: AC
  Administered 2016-05-28: 25 mg via INTRAVENOUS
  Filled 2016-05-28: qty 1

## 2016-05-28 MED ORDER — HEPARIN SOD (PORK) LOCK FLUSH 100 UNIT/ML IV SOLN
500.0000 [IU] | Freq: Once | INTRAVENOUS | Status: AC
  Administered 2016-05-28: 500 [IU]
  Filled 2016-05-28: qty 5

## 2016-05-28 MED ORDER — HYDROMORPHONE HCL 1 MG/ML IJ SOLN
2.0000 mg | INTRAMUSCULAR | Status: AC
  Administered 2016-05-28: 2 mg via INTRAVENOUS
  Filled 2016-05-28: qty 2

## 2016-05-28 MED ORDER — HYDROMORPHONE HCL 1 MG/ML IJ SOLN
2.0000 mg | INTRAMUSCULAR | Status: AC
  Filled 2016-05-28: qty 2

## 2016-05-28 MED ORDER — DIPHENHYDRAMINE HCL 50 MG/ML IJ SOLN
12.5000 mg | Freq: Once | INTRAMUSCULAR | Status: AC
  Administered 2016-05-28: 12.5 mg via INTRAVENOUS
  Filled 2016-05-28: qty 1

## 2016-05-28 MED ORDER — HYDROMORPHONE HCL 1 MG/ML IJ SOLN
2.0000 mg | INTRAMUSCULAR | Status: DC
  Administered 2016-05-28: 2 mg via INTRAVENOUS

## 2016-05-28 MED ORDER — HYDROMORPHONE HCL 1 MG/ML IJ SOLN
2.0000 mg | Freq: Once | INTRAMUSCULAR | Status: AC
  Administered 2016-05-28: 2 mg via INTRAVENOUS
  Filled 2016-05-28: qty 2

## 2016-05-28 MED ORDER — HYDROMORPHONE HCL 1 MG/ML IJ SOLN: 2.0000 mg | INTRAMUSCULAR | Status: DC

## 2016-05-28 NOTE — ED Notes (Addendum)
Per [place] PA and Pollina MD recheck pt BP if systolic 945 or greater administer pain medication then discharge if BP remains systolic 859 or greater.

## 2016-05-28 NOTE — Discharge Instructions (Signed)
Read the information below.  You may return to the Emergency Department at any time for worsening condition or any new symptoms that concern you.   If you develop worsening chest pain, shortness of breath, fever, you pass out, or become weak or dizzy, return to the ER for a recheck.

## 2016-05-28 NOTE — ED Triage Notes (Signed)
Pt states he is having bilateral rib pain that started about 1am this morning

## 2016-05-28 NOTE — ED Provider Notes (Signed)
[facility] DEPT Provider Note   CSN: [id number] Arrival date & time: 05/28/16  0258     History   Chief Complaint Chief Complaint  Patient presents with  . Sickle Cell Pain Crisis    HPI [name] is a 36 y.o. male.  HPI   Pt well known to the Emergency Department with hx sickle cell disease, acute chest, pulmonary embolism on xarelto, pulmonary hypertension, stroke p/w bilateral rib pain that woke him from sleep at 1am.  States this is similar to his normal sickle cell crises.  Denies fevers, SOB, cough, abdominal pain, N/V/D, pain elsewhere.  Has O2 at home but states he hasn't had to use it recently.  Denies any change of his medication, not out of any medications.    Past Medical History:  Diagnosis Date  . Acute chest syndrome ([place]) 06/18/2013  . Acute embolism and thrombosis of right internal jugular vein ( [place])   . Alcohol consumption of one to four drinks per day   . Avascular necrosis (HCC)    Right Hip  . Blood transfusion   . Chronic anticoagulation   . Demand ischemia ([place]) 01/02/2014  . Former smoker   . Functional asplenia   . Hb-SS disease with crisis ([place])   . History of Clostridium difficile infection   . History of pulmonary embolus (PE)   . Hypertension   . Hypokalemia   . Leukocytosis    Chronic  . Mood disorder ([place])   . Noncompliance with medication regimen   . Oxygen deficiency   . Pulmonary hypertension ([place])   . Second hand tobacco smoke exposure   . Sickle cell anemia (HCC)   . Sickle-cell crisis with associated acute chest syndrome ([place]) 05/13/2013  . Stroke ([place])   . Thrombocytosis (HCC)    Chronic  . Uses marijuana     Patient Active Problem List   Diagnosis Date Noted  . Respiratory failure, acute and chronic ([place]) 05/14/2016  . Acute pulmonary edema ([place]) 04/11/2016  . H/O arterial ischemic stroke 11/11/2015  . Slurred speech 11/10/2015  . Chronic anemia   . Hb-SS disease without crisis ([place]) 10/07/2014  . Acute on  chronic respiratory failure with hypoxia ([place])   . Prolonged Q-T interval on ECG 09/10/2014  . Troponin level elevated 08/29/2014  . Cor pulmonale, chronic ([place]) 08/25/2014  . Sickle cell anemia ([place]) 06/25/2014  . Anemia of chronic disease 06/25/2014  . PAH (pulmonary artery hypertension) ([place]) 03/18/2014  . Chronic respiratory failure with hypoxia ([place]) 03/14/2014  . Paralytic strabismus, external ophthalmoplegia   . Chronic pain syndrome 12/12/2013  . Chronic anticoagulation 08/22/2013  . Essential hypertension 08/22/2013  . Pulmonary HTN ([place]) 06/18/2013  . Functional asplenia   . Vitamin D deficiency 02/13/2013  . Sickle cell crisis ([place]) 01/13/2013  . Rib pain 01/13/2013  . Hx of pulmonary embolus 06/29/2012  . 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 Avascular necrosis ([place])   . Sickle cell pain crisis [facility])     Past Surgical History:  Procedure Laterality Date  . CHOLECYSTECTOMY     01/2008  . Excision of left periauricular cyst     10/2009  . Excision of right ear lobe cyst with primary closur     11/2007  . Porta cath placement    . Porta cath removal    . PORTACATH PLACEMENT  01/05/2012   Procedure: INSERTION PORT-A-CATH;  Surgeon: Odis Hollingshead, MD;  Location: [facility];  Service: General;  Laterality: N/A;  ultrasound  guiced port a cath insertion with fluoroscopy  . Right hip replacement     08/2006  . UMBILICAL HERNIA REPAIR     01/2008       Home Medications    Prior to Admission medications   Medication Sig Start Date End Date Taking? Authorizing Provider  ambrisentan (LETAIRIS) 5 MG tablet Take 5 mg by mouth daily.    Yes Historical Provider, MD  carvedilol (COREG) 3.125 MG tablet Take 1 tablet (3.125 mg total) by mouth 2 (two) times daily with a meal. 05/17/16  Yes [name], MD  cholecalciferol (VITAMIN D) 1000 units tablet Take 2,000 Units by mouth daily.   Yes Historical Provider, MD  folic acid (FOLVITE) 1 MG tablet Take 2 tablets (2 mg  total) by mouth daily. 04/25/16  Yes [name], MD  gabapentin (NEURONTIN) 300 MG capsule Take 1 capsule (300 mg total) by mouth 3 (three) times daily. 06/01/15  Yes Tresa Garter, MD  hydroxyurea (HYDREA) 500 MG capsule Take 1000 mg QOD and alternate with 500 mg QOD. May take with food to minimize GI side effects. Patient taking differently: Take 500-1,000 mg by mouth See admin instructions. Take 1000 mg QOD and alternate with 500 mg QOD. May take with food to minimize GI side effects. 04/25/16  Yes [name], MD  L-glutamine (ENDARI) 5 g PACK Powder Packet Take 15 g by mouth 2 (two) times daily. 04/25/16  Yes [name], MD  lisinopril (PRINIVIL,ZESTRIL) 2.5 MG tablet Take 2.5 mg by mouth daily. 05/22/16  Yes Historical Provider, MD  morphine (MS CONTIN) 30 MG 12 hr tablet Take 1 tablet (30 mg total) by mouth every 12 (twelve) hours. 05/06/16 06/05/16 Yes Tresa Garter, MD  potassium chloride SA (K-DUR,KLOR-CON) 20 MEQ tablet Take 2 tablets (40 mEq total) by mouth daily. 04/27/16  Yes [name], MD  rivaroxaban (XARELTO) 20 MG TABS tablet Take 1 tablet (20 mg total) by mouth at bedtime. 05/24/16  Yes Dorena Dew, FNP  torsemide (DEMADEX) 20 MG tablet Take 1 tablet (20 mg total) by mouth daily. 05/08/16  Yes [name], MD  zolpidem (AMBIEN) 10 MG tablet Take 1 tablet (10 mg total) by mouth at bedtime as needed for sleep. 05/06/16 06/05/16 Yes Tresa Garter, MD    Family History Family History  Problem Relation Age of Onset  . Sickle cell trait Mother   . Depression Mother   . Diabetes Mother   . Sickle cell trait Father   . Sickle cell trait Brother     Social History Social History  Substance Use Topics  . Smoking status: Former Smoker    Packs/day: 0.50    Years: 10.00    Types: Cigarettes    Quit date: 05/29/2011  . Smokeless tobacco: Never Used  . Alcohol use No     Allergies   Patient has no known allergies.   Review  of Systems Review of Systems  All other systems reviewed and are negative.    Physical Exam Updated Vital Signs BP 104/64 (BP Location: Right Arm)   Pulse 70   Temp 99.2 F (37.3 C) (Oral)   Resp 15   Ht 6' (1.829 m)   Wt 90.7 kg   SpO2 96%   BMI 27.12 kg/m   Physical Exam  Constitutional: He appears well-developed and well-nourished. No distress.  HENT:  Head: Normocephalic and atraumatic.  Neck: Neck supple.  Cardiovascular: Normal rate, regular rhythm and  intact distal pulses.   Pulmonary/Chest: Effort normal and breath sounds normal. No respiratory distress. He has no wheezes. He has no rales.  Abdominal: Soft. He exhibits no distension and no mass. There is no tenderness. There is no rebound and no guarding.  Neurological: He is alert. He exhibits normal muscle tone.  Skin: He is not diaphoretic.  Nursing note and vitals reviewed.    ED Treatments / Results  Labs (all labs ordered are listed, but only abnormal results are displayed) Labs Reviewed  CBC WITH DIFFERENTIAL/PLATELET - Abnormal; Notable for the following:       Result Value   WBC 11.1 (*)    RBC 2.46 (*)    Hemoglobin 7.4 (*)    HCT 21.4 (*)    RDW 19.2 (*)    Platelets 553 (*)    Monocytes Absolute 1.6 (*)    All other components within normal limits  COMPREHENSIVE METABOLIC PANEL - Abnormal; Notable for the following:    Glucose, Bld 105 (*)    BUN 25 (*)    Total Bilirubin 3.8 (*)    All other components within normal limits    EKG  EKG Interpretation  Date/Time:  Saturday May 28 2016 03:46:48 EDT Ventricular Rate:  76 PR Interval:    QRS Duration: 127 QT Interval:  411 QTC Calculation: 463 R Axis:   -91 Text Interpretation:  Sinus rhythm Borderline prolonged PR interval Probable left atrial enlargement Nonspecific IVCD with LAD Inferior infarct, age indeterminate Lateral leads are also involved No significant change since last tracing Confirmed by [facility]  MD, CHRISTOPHER [phone])  on 05/28/2016 5:09:21 AM       Radiology Dg Chest 2 View  Result Date: 05/28/2016 CLINICAL DATA:  36 year old male with sickle cell anemia presenting with bilateral rib pain. EXAM: CHEST  2 VIEW COMPARISON:  Chest radiograph dated 05/14/2016 FINDINGS: Left pectoral subclavian approach infusion catheter with tip over the right atrial silhouette adjacent to the cavoatrial junction. There is stable cardiomegaly with mild vascular congestion. No new consolidative changes. There is no pleural effusion or pneumothorax. No acute osseous pathology. IMPRESSION: 1. No focal consolidation. 2. Cardiomegaly with probable mild vascular congestion. Electronically Signed   By: Anner Crete M.D.   On: 05/28/2016 04:44    Procedures Procedures (including critical care time)  Medications Ordered in ED Medications  HYDROmorphone (DILAUDID) injection 2 mg (not administered)    Or  HYDROmorphone (DILAUDID) injection 2 mg (not administered)  HYDROmorphone (DILAUDID) injection 2 mg (2 mg Intravenous Given 05/28/16 0600)    Or  HYDROmorphone (DILAUDID) injection 2 mg ( Subcutaneous See Alternative 05/28/16 0600)  HYDROmorphone (DILAUDID) injection 2 mg (not administered)  HYDROmorphone (DILAUDID) injection 2 mg (2 mg Intravenous Given 05/28/16 0412)    Or  HYDROmorphone (DILAUDID) injection 2 mg ( Subcutaneous See Alternative 05/28/16 0412)  HYDROmorphone (DILAUDID) injection 2 mg (2 mg Intravenous Given 05/28/16 0510)    Or  HYDROmorphone (DILAUDID) injection 2 mg ( Subcutaneous See Alternative 05/28/16 0510)  diphenhydrAMINE (BENADRYL) injection 25 mg (25 mg Intravenous Given 05/28/16 0412)  diphenhydrAMINE (BENADRYL) injection 12.5 mg (12.5 mg Intravenous Given 05/28/16 0605)     Initial Impression / Assessment and Plan / ED Course  I have reviewed the triage vital signs and the nursing notes.  Pertinent labs & imaging results that were available during my care of the patient were reviewed by me and  considered in my medical decision making (see chart for details).  Clinical Course as of  May 28 625  Sat May 28, 2016  0622 Pt reports he will be ready to go with one more dose.  Pt has improved to 5/10 intensity.    [EW]    Clinical Course User Index [EW] Clayton Bibles, PA-C    Afebrile nontoxic patient with bilateral rib pain.  Labs at baseline.  CXR negative.  EKG unchanged. Pt feeling better with sickle cell protocol medications, ready for d/c home.  Doubt acute chest, new PE, pna, ACS.  PCP follow up.  Discussed result, findings, treatment, and follow up  with patient.  Pt given return precautions.  Pt verbalizes understanding and agrees with plan.      Final Clinical Impressions(s) / ED Diagnoses   Final diagnoses:  Sickle cell anemia with pain [facility])    New Prescriptions New Prescriptions   No medications on file     [place], PA-C 05/28/16 [place], MD 05/28/16 [phone]

## 2016-05-28 NOTE — ED Notes (Signed)
Pt is c/o of mild itching.

## 2016-05-28 NOTE — ED Notes (Signed)
Pt given ham sandwich, [place] sandwich, applesauce, and ice water per request.

## 2016-05-29 ENCOUNTER — Encounter (HOSPITAL_COMMUNITY): Payer: Self-pay

## 2016-05-29 ENCOUNTER — Emergency Department (HOSPITAL_COMMUNITY)
Admission: EM | Admit: 2016-05-29 | Discharge: 2016-05-29 | Disposition: A | Discharge status: Home / Self Care | Payer: Medicare Other | Source: Home / Self Care | Attending: Emergency Medicine | Admitting: Emergency Medicine

## 2016-05-29 DIAGNOSIS — Z8673 Personal history of transient ischemic attack (TIA), and cerebral infarction without residual deficits: Secondary | ICD-10-CM | POA: Insufficient documentation

## 2016-05-29 DIAGNOSIS — Z87891 Personal history of nicotine dependence: Secondary | ICD-10-CM | POA: Insufficient documentation

## 2016-05-29 DIAGNOSIS — Z96641 Presence of right artificial hip joint: Secondary | ICD-10-CM

## 2016-05-29 DIAGNOSIS — D57219 Sickle-cell/Hb-C disease with crisis, unspecified: Secondary | ICD-10-CM

## 2016-05-29 DIAGNOSIS — Z79899 Other long term (current) drug therapy: Secondary | ICD-10-CM

## 2016-05-29 DIAGNOSIS — D5700 Hb-SS disease with crisis, unspecified: Secondary | ICD-10-CM

## 2016-05-29 DIAGNOSIS — I10 Essential (primary) hypertension: Secondary | ICD-10-CM

## 2016-05-29 LAB — BASIC METABOLIC PANEL
Anion gap: 5 (ref 5–15)
BUN: 11 mg/dL (ref 6–20)
CO2: 23 mmol/L (ref 22–32)
Calcium: 9 mg/dL (ref 8.9–10.3)
Chloride: 108 mmol/L (ref 101–111)
Creatinine, Ser: 0.86 mg/dL (ref 0.61–1.24)
GFR calc Af Amer: >60 mL/min (ref >60)
Glucose, Bld: 103 mg/dL — ABNORMAL HIGH (ref 65–99)
POTASSIUM: 4 mmol/L (ref 3.5–5.1)
Sodium: 136 mmol/L (ref 135–145)

## 2016-05-29 LAB — CBC WITH DIFFERENTIAL/PLATELET
BASOS ABS: 0.1 10*3/uL (ref 0.0–0.1)
BASOS PCT: 1 %
EOS PCT: 1 %
Eosinophils Absolute: 0.2 10*3/uL (ref 0.0–0.7)
HCT: 21.8 % — ABNORMAL LOW (ref 39.0–52.0)
Hemoglobin: 7.4 g/dL — ABNORMAL LOW (ref 13.0–17.0)
LYMPHS PCT: 12 %
Lymphs Abs: 1.3 10*3/uL (ref 0.7–4.0)
MCH: 30.1 pg (ref 26.0–34.0)
MCHC: 33.9 g/dL (ref 30.0–36.0)
MCV: 88.6 fL (ref 78.0–100.0)
MONO ABS: 1.1 10*3/uL — AB (ref 0.1–1.0)
Monocytes Relative: 10 %
Neutro Abs: 8.1 10*3/uL — ABNORMAL HIGH (ref 1.7–7.7)
Neutrophils Relative %: 76 %
PLATELETS: 569 10*3/uL — AB (ref 150–400)
RBC: 2.46 MIL/uL — AB (ref 4.22–5.81)
RDW: 19.3 % — AB (ref 11.5–15.5)
WBC: 10.7 10*3/uL — AB (ref 4.0–10.5)

## 2016-05-29 LAB — RETICULOCYTES
RBC.: 2.46 MIL/uL — ABNORMAL LOW (ref 4.22–5.81)
RETIC COUNT ABSOLUTE: 110.7 10*3/uL (ref 19.0–186.0)
RETIC CT PCT: 4.5 % — AB (ref 0.4–3.1)

## 2016-05-29 MED ORDER — HYDROMORPHONE HCL 1 MG/ML IJ SOLN: 2.0000 mg | INTRAMUSCULAR | Status: DC

## 2016-05-29 MED ORDER — KETOROLAC TROMETHAMINE 15 MG/ML IJ SOLN
15.0000 mg | INTRAMUSCULAR | Status: AC
  Administered 2016-05-29: 15 mg via INTRAVENOUS
  Filled 2016-05-29: qty 1

## 2016-05-29 MED ORDER — HYDROMORPHONE HCL 1 MG/ML IJ SOLN
2.0000 mg | INTRAMUSCULAR | Status: AC
  Administered 2016-05-29: 2 mg via INTRAVENOUS
  Filled 2016-05-29: qty 2

## 2016-05-29 MED ORDER — HEPARIN SOD (PORK) LOCK FLUSH 100 UNIT/ML IV SOLN
500.0000 [IU] | Freq: Once | INTRAVENOUS | Status: AC
  Administered 2016-05-29: 500 [IU]
  Filled 2016-05-29: qty 5

## 2016-05-29 MED ORDER — PROMETHAZINE HCL 25 MG PO TABS
25.0000 mg | ORAL_TABLET | ORAL | Status: DC | PRN
  Administered 2016-05-29: 25 mg via ORAL
  Filled 2016-05-29: qty 1

## 2016-05-29 MED ORDER — SODIUM CHLORIDE 0.45 % IV SOLN
INTRAVENOUS | Status: DC
  Administered 2016-05-29: 125 mL/h via INTRAVENOUS

## 2016-05-29 MED ORDER — DIPHENHYDRAMINE HCL 25 MG PO CAPS
25.0000 mg | ORAL_CAPSULE | ORAL | Status: DC | PRN
  Administered 2016-05-29: 25 mg via ORAL
  Filled 2016-05-29: qty 1

## 2016-05-29 NOTE — Discharge Instructions (Signed)
Continue your current medications, follow up with your doctor if the symptoms are not resolving

## 2016-05-29 NOTE — ED Provider Notes (Signed)
[facility] DEPT Provider Note   CSN: [id number] Arrival date & time: 05/29/16  0758     History   Chief Complaint Chief Complaint  Patient presents with  . Sickle Cell Pain Crisis  . Leg Pain    HPI [name] is a 36 y.o. male.  HPI Patient presents to the emergency room with complaints of recurrent sickle cell crisis. The patient has history of sickle cell disease with frequent as to the emergency room for sickle cell pain.  Patient's symptoms started several days ago. He was seen and evaluated in the emergency room yesterday. Patient was treated and released. The patient states that his symptoms have continued. His home medications are not effective. The pain is primarily in his back and his ribs. He denies any chest pain or shortness of breath. No fevers or chills .  He denies any vomiting or diarrhea. Past Medical History:  Diagnosis Date  . Acute chest syndrome ([place]) 06/18/2013  . Acute embolism and thrombosis of right internal jugular vein ([place])   . Alcohol consumption of one to four drinks per day   . Avascular necrosis (HCC)    Right Hip  . Blood transfusion   . Chronic anticoagulation   . Demand ischemia ([place]) 01/02/2014  . Former smoker   . Functional asplenia   . Hb-SS disease with crisis ([place])   . History of Clostridium difficile infection   . History of pulmonary embolus (PE)   . Hypertension   . Hypokalemia   . Leukocytosis    Chronic  . Mood disorder ([place])   . Noncompliance with medication regimen   . Oxygen deficiency   . Pulmonary hypertension ([place])   . Second hand tobacco smoke exposure   . Sickle cell anemia (HCC)   . Sickle-cell crisis with associated acute chest syndrome ([place]) 05/13/2013  . Stroke ([place])   . Thrombocytosis (HCC)    Chronic  . Uses marijuana     Patient Active Problem List   Diagnosis Date Noted  . Respiratory failure, acute and chronic ([place]) 05/14/2016  . Acute pulmonary edema ([place]) 04/11/2016  . H/O arterial  ischemic stroke 11/11/2015  . Slurred speech 11/10/2015  . Chronic anemia   . Hb-SS disease without crisis ([place]) 10/07/2014  . Acute on chronic respiratory failure with hypoxia ([place])   . Prolonged Q-T interval on ECG 09/10/2014  . Troponin level elevated 08/29/2014  . Cor pulmonale, chronic ([place]) 08/25/2014  . Sickle cell anemia ([place]) 06/25/2014  . Anemia of chronic disease 06/25/2014  . PAH (pulmonary artery hypertension) ([place]) 03/18/2014  . Chronic respiratory failure with hypoxia ([place]) 03/14/2014  . Paralytic strabismus, external ophthalmoplegia   . Chronic pain syndrome 12/12/2013  . Chronic anticoagulation 08/22/2013  . Essential hypertension 08/22/2013  . Pulmonary HTN ([place]) 06/18/2013  . Functional asplenia   . Vitamin D deficiency 02/13/2013  . Sickle cell crisis ([place]) 01/13/2013  . Rib pain 01/13/2013  . Hx of pulmonary embolus 06/29/2012  . 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 Avascular necrosis ([place])   . Sickle cell pain crisis [facility])     Past Surgical History:  Procedure Laterality Date  . CHOLECYSTECTOMY     01/2008  . Excision of left periauricular cyst     10/2009  . Excision of right ear lobe cyst with primary closur     11/2007  . Porta cath placement    . Porta cath removal    . PORTACATH PLACEMENT  01/05/2012   Procedure: INSERTION PORT-A-CATH;  Surgeon: Odis Hollingshead, MD;  Location: [facility];  Service: General;  Laterality: N/A;  ultrasound guiced port a cath insertion with fluoroscopy  . Right hip replacement     08/2006  . UMBILICAL HERNIA REPAIR     01/2008       Home Medications    Prior to Admission medications   Medication Sig Start Date End Date Taking? Authorizing Provider  ambrisentan (LETAIRIS) 5 MG tablet Take 5 mg by mouth daily.    Yes Historical Provider, MD  carvedilol (COREG) 3.125 MG tablet Take 1 tablet (3.125 mg total) by mouth 2 (two) times daily with a meal. 05/17/16  Yes [name], MD  cholecalciferol (VITAMIN D)  1000 units tablet Take 2,000 Units by mouth daily.   Yes Historical Provider, MD  folic acid (FOLVITE) 1 MG tablet Take 2 tablets (2 mg total) by mouth daily. 04/25/16  Yes [name], MD  gabapentin (NEURONTIN) 300 MG capsule Take 1 capsule (300 mg total) by mouth 3 (three) times daily. 06/01/15  Yes Tresa Garter, MD  hydroxyurea (HYDREA) 500 MG capsule Take 1000 mg QOD and alternate with 500 mg QOD. May take with food to minimize GI side effects. Patient taking differently: Take 500-1,000 mg by mouth See admin instructions. Take 1000 mg QOD and alternate with 500 mg QOD. May take with food to minimize GI side effects. 04/25/16  Yes [name], MD  L-glutamine (ENDARI) 5 g PACK Powder Packet Take 15 g by mouth 2 (two) times daily. 04/25/16  Yes [name], MD  lisinopril (PRINIVIL,ZESTRIL) 2.5 MG tablet Take 2.5 mg by mouth daily. 05/22/16  Yes Historical Provider, MD  morphine (MS CONTIN) 30 MG 12 hr tablet Take 1 tablet (30 mg total) by mouth every 12 (twelve) hours. 05/06/16 06/05/16 Yes Tresa Garter, MD  potassium chloride SA (K-DUR,KLOR-CON) 20 MEQ tablet Take 2 tablets (40 mEq total) by mouth daily. 04/27/16  Yes [name], MD  rivaroxaban (XARELTO) 20 MG TABS tablet Take 1 tablet (20 mg total) by mouth at bedtime. 05/24/16  Yes Dorena Dew, FNP  torsemide (DEMADEX) 20 MG tablet Take 1 tablet (20 mg total) by mouth daily. 05/08/16  Yes [name], MD  zolpidem (AMBIEN) 10 MG tablet Take 1 tablet (10 mg total) by mouth at bedtime as needed for sleep. 05/06/16 06/05/16 Yes Tresa Garter, MD    Family History Family History  Problem Relation Age of Onset  . Sickle cell trait Mother   . Depression Mother   . Diabetes Mother   . Sickle cell trait Father   . Sickle cell trait Brother     Social History Social History  Substance Use Topics  . Smoking status: Former Smoker    Packs/day: 0.50    Years: 10.00    Types: Cigarettes     Quit date: 05/29/2011  . Smokeless tobacco: Never Used  . Alcohol use No     Allergies   Patient has no known allergies.   Review of Systems Review of Systems  All other systems reviewed and are negative.    Physical Exam Updated Vital Signs BP 108/84   Pulse 66   Temp 97.4 F (36.3 C) (Oral)   Resp 17   SpO2 (!) 89%   Physical Exam  Constitutional: He appears well-developed and well-nourished. No distress.  HENT:  Head: Normocephalic and atraumatic.  Right Ear: External ear normal.  Left Ear: External ear normal.  Eyes: Conjunctivae are normal. Right eye exhibits no discharge. Left eye exhibits no discharge. No scleral icterus.  Neck: Neck supple. No tracheal deviation present.  Cardiovascular: Normal rate, regular rhythm and intact distal pulses.   Pulmonary/Chest: Effort normal and breath sounds normal. No stridor. No respiratory distress. He has no wheezes. He has no rales.  Abdominal: Soft. Bowel sounds are normal. He exhibits no distension. There is no tenderness. There is no rebound and no guarding.  Musculoskeletal: He exhibits no edema or tenderness.  Neurological: He is alert. He has normal strength. No sensory deficit. Cranial nerve deficit: no gross deficits. He exhibits normal muscle tone. He displays no seizure activity. Coordination normal.  Skin: Skin is warm and dry. No rash noted.  Psychiatric: He has a normal mood and affect.  Nursing note and vitals reviewed.    ED Treatments / Results  Labs (all labs ordered are listed, but only abnormal results are displayed) Labs Reviewed  CBC WITH DIFFERENTIAL/PLATELET - Abnormal; Notable for the following:       Result Value   WBC 10.7 (*)    RBC 2.46 (*)    Hemoglobin 7.4 (*)    HCT 21.8 (*)    RDW 19.3 (*)    Platelets 569 (*)    Neutro Abs 8.1 (*)    Monocytes Absolute 1.1 (*)    All other components within normal limits  RETICULOCYTES - Abnormal; Notable for the following:    Retic Ct Pct 4.5 (*)     RBC. 2.46 (*)    All other components within normal limits  BASIC METABOLIC PANEL - Abnormal; Notable for the following:    Glucose, Bld 103 (*)    All other components within normal limits    EKG  EKG Interpretation None       Radiology Dg Chest 2 View  Result Date: 05/28/2016 CLINICAL DATA:  36 year old male with sickle cell anemia presenting with bilateral rib pain. EXAM: CHEST  2 VIEW COMPARISON:  Chest radiograph dated 05/14/2016 FINDINGS: Left pectoral subclavian approach infusion catheter with tip over the right atrial silhouette adjacent to the cavoatrial junction. There is stable cardiomegaly with mild vascular congestion. No new consolidative changes. There is no pleural effusion or pneumothorax. No acute osseous pathology. IMPRESSION: 1. No focal consolidation. 2. Cardiomegaly with probable mild vascular congestion. Electronically Signed   By: Anner Crete M.D.   On: 05/28/2016 04:44    Procedures Procedures (including critical care time)  Medications Ordered in ED Medications  0.45 % sodium chloride infusion (125 mL/hr Intravenous New Bag/Given 05/29/16 0852)  diphenhydrAMINE (BENADRYL) capsule 25-50 mg (25 mg Oral Given 05/29/16 0855)  promethazine (PHENERGAN) tablet 25 mg (25 mg Oral Given 05/29/16 0855)  heparin lock flush 100 unit/mL (not administered)  ketorolac (TORADOL) 15 MG/ML injection 15 mg (15 mg Intravenous Given 05/29/16 0852)  HYDROmorphone (DILAUDID) injection 2 mg (2 mg Intravenous Given 05/29/16 0853)  HYDROmorphone (DILAUDID) injection 2 mg (2 mg Intravenous Given 05/29/16 0938)  HYDROmorphone (DILAUDID) injection 2 mg (2 mg Intravenous Given 05/29/16 1013)     Initial Impression / Assessment and Plan / ED Course  I have reviewed the triage vital signs and the nursing notes.  Pertinent labs & imaging results that were available during my care of the patient were reviewed by me and considered in my medical decision making (see chart for  details).   Pt presents with recurrent pain associated with his sickle cell disease.  Labs are at baseline.  No  sign of acute aplastic crisis or chest syndrome. SX improved with treatment in the ED and pt now feels ready for discharge.  He feels he can manage his symptoms at home and does not require hospitalization.  Final Clinical Impressions(s) / ED Diagnoses   Final diagnoses:  Sickle cell anemia with crisis [facility])    New Prescriptions New Prescriptions   No medications on file     Dorie Rank, MD 05/29/16 1058

## 2016-05-29 NOTE — ED Notes (Signed)
Port would have blood return after flushing, but when trying to pull enough blood for lab specimen, unable to get enough blood to pull back.  Flushed port 3-4 times and adjusted patient's arm, but still not enough blood for specimens.

## 2016-05-29 NOTE — ED Triage Notes (Signed)
Pt seen yesterday for same. Sickle cell pain.  Pt states pain in bilateral ribs and legs.  Denies specific chest pain.

## 2016-05-29 NOTE — ED Notes (Signed)
Tried pulling blood off port after 30 minutes of IV fluids running through port.  Still unable to get back enough blood for specimens.

## 2016-05-30 ENCOUNTER — Encounter (HOSPITAL_COMMUNITY): Payer: Self-pay | Admitting: Emergency Medicine

## 2016-05-30 ENCOUNTER — Inpatient Hospital Stay (HOSPITAL_COMMUNITY)
Admission: EM | Admit: 2016-05-30 | Discharge: 2016-06-05 | DRG: 812 | Disposition: A | Discharge status: Home / Self Care | Payer: Medicare Other | Attending: Internal Medicine | Admitting: Internal Medicine

## 2016-05-30 DIAGNOSIS — Z7901 Long term (current) use of anticoagulants: Secondary | ICD-10-CM | POA: Diagnosis not present

## 2016-05-30 DIAGNOSIS — R7989 Other specified abnormal findings of blood chemistry: Secondary | ICD-10-CM

## 2016-05-30 DIAGNOSIS — Z9981 Dependence on supplemental oxygen: Secondary | ICD-10-CM | POA: Diagnosis not present

## 2016-05-30 DIAGNOSIS — R778 Other specified abnormalities of plasma proteins: Secondary | ICD-10-CM

## 2016-05-30 DIAGNOSIS — R9431 Abnormal electrocardiogram [ECG] [EKG]: Secondary | ICD-10-CM | POA: Diagnosis not present

## 2016-05-30 DIAGNOSIS — Z79899 Other long term (current) drug therapy: Secondary | ICD-10-CM | POA: Diagnosis not present

## 2016-05-30 DIAGNOSIS — Z86711 Personal history of pulmonary embolism: Secondary | ICD-10-CM

## 2016-05-30 DIAGNOSIS — I2721 Secondary pulmonary arterial hypertension: Secondary | ICD-10-CM | POA: Diagnosis present

## 2016-05-30 DIAGNOSIS — J9611 Chronic respiratory failure with hypoxia: Secondary | ICD-10-CM

## 2016-05-30 DIAGNOSIS — Z87891 Personal history of nicotine dependence: Secondary | ICD-10-CM | POA: Diagnosis not present

## 2016-05-30 DIAGNOSIS — G894 Chronic pain syndrome: Secondary | ICD-10-CM | POA: Diagnosis not present

## 2016-05-30 DIAGNOSIS — R0781 Pleurodynia: Secondary | ICD-10-CM | POA: Diagnosis not present

## 2016-05-30 DIAGNOSIS — I272 Pulmonary hypertension, unspecified: Secondary | ICD-10-CM | POA: Diagnosis not present

## 2016-05-30 DIAGNOSIS — I10 Essential (primary) hypertension: Secondary | ICD-10-CM | POA: Diagnosis present

## 2016-05-30 DIAGNOSIS — D5700 Hb-SS disease with crisis, unspecified: Secondary | ICD-10-CM

## 2016-05-30 DIAGNOSIS — Z8673 Personal history of transient ischemic attack (TIA), and cerebral infarction without residual deficits: Secondary | ICD-10-CM | POA: Diagnosis not present

## 2016-05-30 DIAGNOSIS — Z79891 Long term (current) use of opiate analgesic: Secondary | ICD-10-CM | POA: Diagnosis not present

## 2016-05-30 DIAGNOSIS — D72829 Elevated white blood cell count, unspecified: Secondary | ICD-10-CM | POA: Diagnosis present

## 2016-05-30 DIAGNOSIS — I4581 Long QT syndrome: Secondary | ICD-10-CM | POA: Diagnosis present

## 2016-05-30 DIAGNOSIS — J449 Chronic obstructive pulmonary disease, unspecified: Secondary | ICD-10-CM | POA: Diagnosis present

## 2016-05-30 LAB — CBC WITH DIFFERENTIAL/PLATELET
BASOS ABS: 0.1 10*3/uL (ref 0.0–0.1)
BASOS PCT: 1 %
EOS ABS: 0.2 10*3/uL (ref 0.0–0.7)
Eosinophils Relative: 2 %
HCT: 22.8 % — ABNORMAL LOW (ref 39.0–52.0)
Hemoglobin: 7.6 g/dL — ABNORMAL LOW (ref 13.0–17.0)
Lymphocytes Relative: 17 %
Lymphs Abs: 2.3 10*3/uL (ref 0.7–4.0)
MCH: 29.6 pg (ref 26.0–34.0)
MCHC: 33.3 g/dL (ref 30.0–36.0)
MCV: 88.7 fL (ref 78.0–100.0)
MONOS PCT: 14 %
Monocytes Absolute: 1.9 10*3/uL — ABNORMAL HIGH (ref 0.1–1.0)
NEUTROS PCT: 66 %
Neutro Abs: 9 10*3/uL — ABNORMAL HIGH (ref 1.7–7.7)
Platelets: 621 10*3/uL — ABNORMAL HIGH (ref 150–400)
RBC: 2.57 MIL/uL — AB (ref 4.22–5.81)
RDW: 19.5 % — ABNORMAL HIGH (ref 11.5–15.5)
WBC: 13.6 10*3/uL — AB (ref 4.0–10.5)

## 2016-05-30 LAB — RETICULOCYTES
RBC.: 2.57 MIL/uL — AB (ref 4.22–5.81)
RETIC COUNT ABSOLUTE: 190.2 10*3/uL — AB (ref 19.0–186.0)
RETIC CT PCT: 7.4 % — AB (ref 0.4–3.1)

## 2016-05-30 LAB — BASIC METABOLIC PANEL
ANION GAP: 6 (ref 5–15)
BUN: 7 mg/dL (ref 6–20)
CALCIUM: 9 mg/dL (ref 8.9–10.3)
CHLORIDE: 107 mmol/L (ref 101–111)
CO2: 22 mmol/L (ref 22–32)
CREATININE: 0.89 mg/dL (ref 0.61–1.24)
GFR calc non Af Amer: >60 mL/min (ref >60)
Glucose, Bld: 118 mg/dL — ABNORMAL HIGH (ref 65–99)
Potassium: 3.5 mmol/L (ref 3.5–5.1)
SODIUM: 135 mmol/L (ref 135–145)

## 2016-05-30 MED ORDER — HYDROMORPHONE HCL 1 MG/ML IJ SOLN: 2.0000 mg | INTRAMUSCULAR | Status: AC

## 2016-05-30 MED ORDER — HYDROMORPHONE HCL 1 MG/ML IJ SOLN: 2.0000 mg | Freq: Once | INTRAMUSCULAR | Status: AC

## 2016-05-30 MED ORDER — DIPHENHYDRAMINE HCL 25 MG PO CAPS
50.0000 mg | ORAL_CAPSULE | Freq: Once | ORAL | Status: AC
  Administered 2016-05-30: 50 mg via ORAL
  Filled 2016-05-30: qty 2

## 2016-05-30 MED ORDER — SODIUM CHLORIDE 0.9% FLUSH: 9.0000 mL | INTRAVENOUS | Status: DC | PRN

## 2016-05-30 MED ORDER — HYDROMORPHONE 1 MG/ML IV SOLN
INTRAVENOUS | Status: DC
  Administered 2016-05-30: 22:00:00 via INTRAVENOUS
  Administered 2016-05-31: 4.19 mg via INTRAVENOUS
  Administered 2016-05-31: 6 mg via INTRAVENOUS
  Administered 2016-05-31: 5.4 mg via INTRAVENOUS
  Administered 2016-05-31: 6.6 mg via INTRAVENOUS
  Administered 2016-05-31: 5.4 mg via INTRAVENOUS
  Administered 2016-05-31: 11:00:00 via INTRAVENOUS
  Administered 2016-05-31: 6.6 mg via INTRAVENOUS
  Administered 2016-06-01: 4.8 mg via INTRAVENOUS
  Administered 2016-06-01 (×2): 0 mg via INTRAVENOUS
  Administered 2016-06-01: 25 mg via INTRAVENOUS
  Administered 2016-06-01: 0 mg via INTRAVENOUS
  Administered 2016-06-01: 17.32 mg via INTRAVENOUS
  Administered 2016-06-02: 3 mg via INTRAVENOUS
  Administered 2016-06-02: 9.6 mg via INTRAVENOUS
  Administered 2016-06-02: 6 mg via INTRAVENOUS
  Administered 2016-06-02: 3 mg via INTRAVENOUS
  Administered 2016-06-02: 7.2 mg via INTRAVENOUS
  Administered 2016-06-02: 3 mg via INTRAVENOUS
  Administered 2016-06-02: 03:00:00 via INTRAVENOUS
  Administered 2016-06-02: 6 mg via INTRAVENOUS
  Administered 2016-06-02: 1 mg via INTRAVENOUS
  Administered 2016-06-03 (×2): via INTRAVENOUS
  Administered 2016-06-03: 6 mg via INTRAVENOUS
  Administered 2016-06-03: 3.6 mg via INTRAVENOUS
  Administered 2016-06-03: 4.2 mg via INTRAVENOUS
  Administered 2016-06-03: 4.8 mg via INTRAVENOUS
  Administered 2016-06-03: 4.2 mg via INTRAVENOUS
  Administered 2016-06-03: 16 mg via INTRAVENOUS
  Administered 2016-06-04: 21:00:00 via INTRAVENOUS
  Administered 2016-06-04: 7.2 mg via INTRAVENOUS
  Administered 2016-06-04: 5.4 mg via INTRAVENOUS
  Administered 2016-06-04: 2.4 mg via INTRAVENOUS
  Administered 2016-06-04: 6.6 mg via INTRAVENOUS
  Administered 2016-06-04: 0 mg via INTRAVENOUS
  Administered 2016-06-05: 1.2 mg via INTRAVENOUS
  Administered 2016-06-05: 6 mg via INTRAVENOUS
  Administered 2016-06-05: 7.2 mg via INTRAVENOUS
  Administered 2016-06-05: 4.8 mg via INTRAVENOUS
  Administered 2016-06-05: 6.6 mg via INTRAVENOUS
  Filled 2016-05-30 (×8): qty 25

## 2016-05-30 MED ORDER — CARVEDILOL 3.125 MG PO TABS
3.1250 mg | ORAL_TABLET | Freq: Two times a day (BID) | ORAL | Status: DC
  Administered 2016-05-30 – 2016-06-05 (×12): 3.125 mg via ORAL
  Filled 2016-05-30 (×12): qty 1

## 2016-05-30 MED ORDER — HYDROXYUREA 500 MG PO CAPS
1000.0000 mg | ORAL_CAPSULE | ORAL | Status: DC
  Administered 2016-05-31 – 2016-06-04 (×4): 1000 mg via ORAL
  Filled 2016-05-30 (×4): qty 2

## 2016-05-30 MED ORDER — DEXTROSE-NACL 5-0.45 % IV SOLN
INTRAVENOUS | Status: DC
  Administered 2016-05-30: 22:00:00 via INTRAVENOUS

## 2016-05-30 MED ORDER — SENNOSIDES-DOCUSATE SODIUM 8.6-50 MG PO TABS
1.0000 | ORAL_TABLET | Freq: Two times a day (BID) | ORAL | Status: DC
  Administered 2016-05-30 – 2016-06-04 (×10): 1 via ORAL
  Filled 2016-05-30 (×12): qty 1

## 2016-05-30 MED ORDER — MAGNESIUM CITRATE PO SOLN: 1.0000 | Freq: Once | ORAL | Status: DC | PRN

## 2016-05-30 MED ORDER — HYDROMORPHONE HCL 1 MG/ML IJ SOLN
2.0000 mg | INTRAMUSCULAR | Status: AC
  Administered 2016-05-30: 2 mg via INTRAVENOUS
  Filled 2016-05-30: qty 2

## 2016-05-30 MED ORDER — RIVAROXABAN 20 MG PO TABS
20.0000 mg | ORAL_TABLET | Freq: Every day | ORAL | Status: DC
  Administered 2016-05-30: 20 mg via ORAL
  Filled 2016-05-30: qty 1

## 2016-05-30 MED ORDER — DIPHENHYDRAMINE HCL 50 MG/ML IJ SOLN
25.0000 mg | INTRAMUSCULAR | Status: DC | PRN
  Administered 2016-06-03: 25 mg via INTRAVENOUS
  Filled 2016-05-30 (×2): qty 0.5

## 2016-05-30 MED ORDER — BISACODYL 10 MG RE SUPP: 10.0000 mg | Freq: Every day | RECTAL | Status: DC | PRN

## 2016-05-30 MED ORDER — KETOROLAC TROMETHAMINE 30 MG/ML IJ SOLN
30.0000 mg | INTRAMUSCULAR | Status: AC
  Administered 2016-05-30: 30 mg via INTRAVENOUS
  Filled 2016-05-30: qty 1

## 2016-05-30 MED ORDER — KETOROLAC TROMETHAMINE 30 MG/ML IJ SOLN
30.0000 mg | Freq: Four times a day (QID) | INTRAMUSCULAR | Status: AC
  Administered 2016-05-31 – 2016-06-04 (×20): 30 mg via INTRAVENOUS
  Filled 2016-05-30 (×19): qty 1

## 2016-05-30 MED ORDER — HYDROMORPHONE HCL 1 MG/ML IJ SOLN
2.0000 mg | Freq: Once | INTRAMUSCULAR | Status: AC
  Administered 2016-05-30: 2 mg via INTRAVENOUS
  Filled 2016-05-30: qty 2

## 2016-05-30 MED ORDER — DEXTROSE-NACL 5-0.45 % IV SOLN
INTRAVENOUS | Status: DC
  Administered 2016-05-30: 18:00:00 via INTRAVENOUS

## 2016-05-30 MED ORDER — ONDANSETRON HCL 4 MG/2ML IJ SOLN: 4.0000 mg | Freq: Four times a day (QID) | INTRAMUSCULAR | Status: DC | PRN

## 2016-05-30 MED ORDER — AMBRISENTAN 5 MG PO TABS: 5.0000 mg | ORAL_TABLET | Freq: Every day | ORAL | Status: DC

## 2016-05-30 MED ORDER — ZOLPIDEM TARTRATE 10 MG PO TABS
10.0000 mg | ORAL_TABLET | Freq: Every evening | ORAL | Status: DC | PRN
  Administered 2016-05-31 – 2016-06-04 (×6): 10 mg via ORAL
  Filled 2016-05-30 (×6): qty 1

## 2016-05-30 MED ORDER — LISINOPRIL 2.5 MG PO TABS
2.5000 mg | ORAL_TABLET | Freq: Every day | ORAL | Status: DC
  Administered 2016-05-31 – 2016-06-05 (×6): 2.5 mg via ORAL
  Filled 2016-05-30 (×6): qty 1

## 2016-05-30 MED ORDER — DIPHENHYDRAMINE HCL 25 MG PO CAPS
25.0000 mg | ORAL_CAPSULE | ORAL | Status: DC | PRN
  Administered 2016-05-31 – 2016-06-04 (×7): 50 mg via ORAL
  Filled 2016-05-30 (×7): qty 2

## 2016-05-30 MED ORDER — HYDROXYUREA 500 MG PO CAPS
500.0000 mg | ORAL_CAPSULE | ORAL | Status: DC
  Administered 2016-06-01 – 2016-06-05 (×3): 500 mg via ORAL
  Filled 2016-05-30 (×3): qty 1

## 2016-05-30 MED ORDER — POLYETHYLENE GLYCOL 3350 17 G PO PACK: 17.0000 g | PACK | Freq: Every day | ORAL | Status: DC | PRN

## 2016-05-30 MED ORDER — MORPHINE SULFATE ER 30 MG PO TBCR
30.0000 mg | EXTENDED_RELEASE_TABLET | Freq: Two times a day (BID) | ORAL | Status: DC
  Administered 2016-05-30 – 2016-06-05 (×12): 30 mg via ORAL
  Filled 2016-05-30 (×12): qty 1

## 2016-05-30 MED ORDER — FOLIC ACID 1 MG PO TABS
2.0000 mg | ORAL_TABLET | Freq: Every day | ORAL | Status: DC
  Administered 2016-05-31 – 2016-06-05 (×6): 2 mg via ORAL
  Filled 2016-05-30 (×6): qty 2

## 2016-05-30 MED ORDER — NALOXONE HCL 0.4 MG/ML IJ SOLN: 0.4000 mg | INTRAMUSCULAR | Status: DC | PRN

## 2016-05-30 MED ORDER — GABAPENTIN 300 MG PO CAPS
300.0000 mg | ORAL_CAPSULE | Freq: Three times a day (TID) | ORAL | Status: DC
  Administered 2016-05-30 – 2016-06-05 (×17): 300 mg via ORAL
  Filled 2016-05-30 (×17): qty 1

## 2016-05-30 MED ORDER — CHLORHEXIDINE GLUCONATE 0.12 % MT SOLN
15.0000 mL | Freq: Two times a day (BID) | OROMUCOSAL | Status: DC
  Administered 2016-05-30 – 2016-06-04 (×8): 15 mL via OROMUCOSAL
  Filled 2016-05-30 (×9): qty 15

## 2016-05-30 NOTE — ED Triage Notes (Signed)
Patient c/o sickle cell pain in ankle and ribs that started last night.

## 2016-05-30 NOTE — ED Notes (Signed)
Call to 3W, RN to call back for report.

## 2016-05-30 NOTE — ED Notes (Signed)
Report give to 3w.  Will move pt once I hear back from RN on floor as she is checking in with Dr. Darnell Level.

## 2016-05-30 NOTE — ED Notes (Addendum)
Pt appears in no distress. Will transport pt up after report.  Labs were sent.

## 2016-05-30 NOTE — H&P (Signed)
[name] LXB:[id number] DOB: 03-20-1980 DOA: 05/30/2016     PCP: Gerald Chessman, MD   Outpatient Specialists: none Patient coming from:  home Lives With family    Chief Complaint: rib pain  HPI: [name] is a 36 y.o. male with medical history significant of sickle cell disease, acute chest, pulmonary embolism on xarelto, pulmonary hypertension, stroke, Chronic respiratory failure with hypoxia on home oxygen at 3-4L    Presented with 2 days of pain bilateral as well as leg/ankle pain bilaterally on medications not effective. Symptoms typical of his sickle cell crisis denies chest pain or shortness of breath no fevers or chills no nausea vomiting. Reports he run out of his MS contin 2 days ago and his next refill will be in 3 days, He has been trying to control his pain at home with dilaudid. Pain NOt similar to prior PE. Reports compliance with anticoagulation.  2 days ago presented to ER with rib pain typical of sickle cell crisis   Labs wereat baseline.  CXR negative.  EKG unchanged. Pt felt better with sickle cell protocol medications and was d/c'ed to home. Came back next day for the same as well as ankle pain  As per ER note No sign of acute aplastic crisis or chest syndrome. Symptoms have improved with treatment in the ED and patient felt ready for discharge. His pain has recurred and he presented for the third time today  still denies any fevers or chills  Regarding pertinent Chronic problems: Last admission was in mid April [name] that time he was noted to have acute on chronic respiratory failure requiring nonrebreather of note patient at baseline has some chronic respiratory failure chronic oxygen at nighttime secondary to mild COPD.he was found to have pulmonary edema and was diuresed he has known history of pulmonary hypertension for which she has been taking Letairis. His typical dilaudid PCA settings are PCA bolus of 0.6 gm , lockout of 10 minutes and 1 hour  limit of 3 mg  states at home oxygen at 3-4L around the clock Anticoagulation due to recurrent Pulmonary emboli  IN ER:  Temp (24hrs), Avg:98.2 F (36.8 C), Min:98.2 F (36.8 C), Max:98.2 F (36.8 C)      RR 18 97% RA HR 66 BP 100/74 Hg for the past 2 days 7.4 which is close to baseline Following Medications were ordered in ER: Medications  dextrose 5 %-0.45 % sodium chloride infusion ( Intravenous New Bag/Given 05/30/16 1740)  HYDROmorphone (DILAUDID) injection 2 mg (not administered)    Or  HYDROmorphone (DILAUDID) injection 2 mg (not administered)  ketorolac (TORADOL) 30 MG/ML injection 30 mg (30 mg Intravenous Given 05/30/16 1758)  HYDROmorphone (DILAUDID) injection 2 mg (2 mg Intravenous Given 05/30/16 1741)    Or  HYDROmorphone (DILAUDID) injection 2 mg ( Subcutaneous See Alternative 05/30/16 1741)  HYDROmorphone (DILAUDID) injection 2 mg (2 mg Intravenous Given 05/30/16 1821)    Or  HYDROmorphone (DILAUDID) injection 2 mg ( Subcutaneous See Alternative 05/30/16 1821)  diphenhydrAMINE (BENADRYL) capsule 50 mg (50 mg Oral Given 05/30/16 1759)     Hospitalist was called for admission for Sickle cell crisis  Review of Systems:    Pertinent positives include:rib and ankle pain joint pain  Constitutional:  No weight loss, night sweats, Fevers, chills, fatigue, weight loss  HEENT:  No headaches, Difficulty swallowing,Tooth/dental problems,Sore throat,  No sneezing, itching, ear ache, nasal congestion, post nasal drip,  Cardio-vascular:  No chest pain, Orthopnea, PND, anasarca, dizziness,  palpitations.no Bilateral lower extremity swelling  GI:  No heartburn, indigestion, abdominal pain, nausea, vomiting, diarrhea, change in bowel habits, loss of appetite, melena, blood in stool, hematemesis Resp:  no shortness of breath at rest. No dyspnea on exertion, No excess mucus, no productive cough, No non-productive cough, No coughing up of blood.No change in color of mucus.No  wheezing. Skin:  no rash or lesions. No jaundice GU:  no dysuria, change in color of urine, no urgency or frequency. No straining to urinate.  No flank pain.  Musculoskeletal:  No or no joint swelling. No decreased range of motion. No back pain.  Psych:  No change in mood or affect. No depression or anxiety. No memory loss.  Neuro: no localizing neurological complaints, no tingling, no weakness, no double vision, no gait abnormality, no slurred speech, no confusion  As per HPI otherwise 10 point review of systems negative.   Past Medical History: Past Medical History:  Diagnosis Date  . Acute chest syndrome ([place]) 06/18/2013  . Acute embolism and thrombosis of right internal jugular vein ([place])   . Alcohol consumption of one to four drinks per day   . Avascular necrosis ([facility])    Right Hip  . Blood transfusion   . Chronic anticoagulation   . Demand ischemia ([place]) 01/02/2014  . Former smoker   . Functional asplenia   . Hb-SS disease with crisis ([place])   . History of Clostridium difficile infection   . History of pulmonary embolus (PE)   . Hypertension   . Hypokalemia   . Leukocytosis    Chronic  . Mood disorder ([place])   . Noncompliance with medication regimen   . Oxygen deficiency   . Pulmonary hypertension ([place])   . Second hand tobacco smoke exposure   . Sickle cell anemia (H[facility])   . Sickle-cell crisis with associated acute chest syndrome ([place]) 05/13/2013  . Stroke ([place])   . Thrombocytosis ([facility])    Chronic  . Uses marijuana    Past Surgical History:  Procedure Laterality Date  . CHOLECYSTECTOMY     01/2008  . Excision of left periauricular cyst     10/2009  . Excision of right ear lobe cyst with primary closur     11/2007  . Porta cath placement    . Porta cath removal    . PORTACATH PLACEMENT  01/05/2012   Procedure: INSERTION PORT-A-CATH;  Surgeon: Odis Hollingshead, MD;  Location: [facility];  Service: General;  Laterality: N/A;  ultrasound guiced port a cath insertion  with fluoroscopy  . Right hip replacement     08/2006  . UMBILICAL HERNIA REPAIR     01/2008     Social History:  Ambulatory   independently    reports that he quit smoking about 5 years ago. His smoking use included Cigarettes. He has a 5.00 pack-year smoking history. He has never used smokeless tobacco. He reports that he does not drink alcohol or use drugs.  Allergies:  No Known Allergies     Family History:   Family History  Problem Relation Age of Onset  . Sickle cell trait Mother   . Depression Mother   . Diabetes Mother   . Sickle cell trait Father   . Sickle cell trait Brother     Medications: Prior to Admission medications   Medication Sig Start Date End Date Taking? Authorizing Provider  ambrisentan (LETAIRIS) 5 MG tablet Take 5 mg by mouth daily.    Yes Historical Provider, MD  carvedilol (COREG) 3.125 MG tablet Take 1 tablet (3.125 mg total) by mouth 2 (two) times daily with a meal. 05/17/16  Yes [name], MD  cholecalciferol (VITAMIN D) 1000 units tablet Take 2,000 Units by mouth daily.   Yes Historical Provider, MD  folic acid (FOLVITE) 1 MG tablet Take 2 tablets (2 mg total) by mouth daily. 04/25/16  Yes [name], MD  gabapentin (NEURONTIN) 300 MG capsule Take 1 capsule (300 mg total) by mouth 3 (three) times daily. 06/01/15  Yes Tresa Garter, MD  hydroxyurea (HYDREA) 500 MG capsule Take 1000 mg QOD and alternate with 500 mg QOD. May take with food to minimize GI side effects. Patient taking differently: Take 500-1,000 mg by mouth See admin instructions. Take 1000 mg QOD and alternate with 500 mg QOD. May take with food to minimize GI side effects. 04/25/16  Yes [name], MD  L-glutamine (ENDARI) 5 g PACK Powder Packet Take 15 g by mouth 2 (two) times daily. 04/25/16  Yes [name], MD  lisinopril (PRINIVIL,ZESTRIL) 2.5 MG tablet Take 2.5 mg by mouth daily. 05/22/16  Yes Historical Provider, MD  morphine (MS CONTIN) 30  MG 12 hr tablet Take 1 tablet (30 mg total) by mouth every 12 (twelve) hours. 05/06/16 06/05/16 Yes Tresa Garter, MD  potassium chloride SA (K-DUR,KLOR-CON) 20 MEQ tablet Take 2 tablets (40 mEq total) by mouth daily. 04/27/16  Yes [name], MD  rivaroxaban (XARELTO) 20 MG TABS tablet Take 1 tablet (20 mg total) by mouth at bedtime. 05/24/16  Yes Dorena Dew, FNP  torsemide (DEMADEX) 20 MG tablet Take 1 tablet (20 mg total) by mouth daily. 05/08/16  Yes [name], MD  zolpidem (AMBIEN) 10 MG tablet Take 1 tablet (10 mg total) by mouth at bedtime as needed for sleep. 05/06/16 06/05/16 Yes Tresa Garter, MD    Physical Exam: Patient Vitals for the past 24 hrs:  BP Temp Temp src Pulse Resp SpO2 Height Weight  05/30/16 1900 110/74 - - 61 17 98 % - -  05/30/16 1830 101/76 - - 63 18 96 % - -  05/30/16 1828 - - - - - 96 % - -  05/30/16 1826 98/71 - - 69 16 92 % - -  05/30/16 1819 107/70 - - 65 (!) 21 94 % - -  05/30/16 1719 - - - - - - 6' (1.829 m) 90.7 kg (200 lb)  05/30/16 1649 117/79 98.2 F (36.8 C) Oral 86 15 98 % - -    1. General:  in No Acute distress 2. Psychological: Alert and  Oriented 3. Head/ENT:     Dry Mucous Membranes                          Head Non traumatic, neck supple                            Poor Dentition 4. SKIN:  decreased Skin turgor,  Skin clean Dry and intact no rash 5. Heart: Regular rate and rhythm no  Murmur, Rub or gallop 6. Lungs:   no wheezes or crackles   7. Abdomen: Soft non-tender, Non distended 8. Lower extremities: no clubbing, cyanosis, or edema 9. Neurologically Grossly intact, moving all 4 extremities equally   10. MSK: Normal range of motion   body mass index is 27.12 kg/m.  Labs on  Admission:   Labs on Admission: I have personally reviewed following labs and imaging studies  CBC:  Recent Labs Lab 05/28/16 0506 05/29/16 1010  WBC 11.1* 10.7*  NEUTROABS 7.7 8.1*  HGB 7.4* 7.4*  HCT 21.4* 21.8*  MCV 87.0  88.6  PLT 553* 097*   Basic Metabolic Panel:  Recent Labs Lab 05/28/16 0506 05/29/16 1010  NA 137 136  K 3.9 4.0  CL 107 108  CO2 23 23  GLUCOSE 105* 103*  BUN 25* 11  CREATININE 1.13 0.86  CALCIUM 8.9 9.0   GFR: Estimated Creatinine Clearance: 129.1 mL/min (by C-G formula based on SCr of 0.86 mg/dL). Liver Function Tests:  Recent Labs Lab 05/28/16 0506  AST 37  ALT 20  ALKPHOS 85  BILITOT 3.8*  PROT 7.8  ALBUMIN 4.2   No results for input(s): LIPASE, AMYLASE in the last 168 hours. No results for input(s): AMMONIA in the last 168 hours. Coagulation Profile: No results for input(s): INR, PROTIME in the last 168 hours. Cardiac Enzymes: No results for input(s): CKTOTAL, CKMB, CKMBINDEX, TROPONINI in the last 168 hours. BNP (last 3 results) No results for input(s): PROBNP in the last 8760 hours. HbA1C: No results for input(s): HGBA1C in the last 72 hours. CBG: No results for input(s): GLUCAP in the last 168 hours. Lipid Profile: No results for input(s): CHOL, HDL, LDLCALC, TRIG, CHOLHDL, LDLDIRECT in the last 72 hours. Thyroid Function Tests: No results for input(s): TSH, T4TOTAL, FREET4, T3FREE, THYROIDAB in the last 72 hours. Anemia Panel:  Recent Labs  05/29/16 1010  RETICCTPCT 4.5*    Sepsis Labs: @LABRCNTIP (procalcitonin:4,lacticidven:4) )No results found for this or any previous visit (from the past 240 hour(s)).      UA  not ordered  No results found for: HGBA1C  Estimated Creatinine Clearance: 129.1 mL/min (by C-G formula based on SCr of 0.86 mg/dL).  BNP (last 3 results) No results for input(s): PROBNP in the last 8760 hours.   ECG REPORT 4/28  Independently reviewed Rate: 76  Rhythm: sinus with unspecified IV delay ST&T Change: T wave inversion similar to prior QTC 463  Filed Weights   05/30/16 1719  Weight: 90.7 kg (200 lb)     Cultures:    Component Value Date/Time   SDES BLOOD PORTA CATH 03/13/2016 1050   SPECREQUEST  BOTTLES DRAWN AEROBIC AND ANAEROBIC 5 CC EACH 03/13/2016 1050   CULT  03/13/2016 1050    NO GROWTH 5 DAYS Performed at [name], [place] [address] [place],  [address]    REPTSTATUS 03/18/2016 FINAL 03/13/2016 1050     Radiological Exams on Admission: No results found.  Chart has been reviewed    Assessment/Plan   36 y.o. male with medical history significant of sickle cell disease, acute chest, pulmonary embolism on xarelto, pulmonary hypertension, stroke admitted for sickle cell crisis  Present on Admission: . Chronic pain syndrome restart MS Contin . Essential hypertension continue home medications . Sickle cell pain crisis ([place])- - will admit per sickle cell protocol,    control pain,    hydrate with IVF D5 .45% Saline @ 75 mls/hour, given recent pulmonary edema will avoid fluid overload monitor carefully may need diuresis   Weight based Dilaudid PCA for opioid tolerant patients.    continue hydroxyurea and folic acid   Transfuse as needed if Hg drops significantly below baseline.    No evidence of acute chest at this time   Sickle cell team to take over management in  AM  . Prolonged Q-T interval on ECG recent EKG showing a prolongation of continue to monitor  . PAH (pulmonary artery hypertension) ([place]) continue home medications . Hx of pulmonary embolus - continue xarelto states current symptoms does not seem similar to prior episodes of PE     Other plan as per orders.  DVT prophylaxis:   Xarelto   Code Status:  FULL CODE as per patient    Family Communication:   Family not  at  Bedside    Disposition Plan:     To home once workup is complete and patient is stable                                       Consults called: none    Admission status:    inpatient       Level of care        medical floor        I have spent a total of 56 min on this admission  Kailer Heindel 05/30/2016, 8:24 PM    [facility]  Pager [phone]    after 2 AM please page floor coverage PA If 7AM-7PM, please contact the day team taking care of the patient  [email or web address]  Password [email or web address]

## 2016-05-30 NOTE — ED Provider Notes (Signed)
Emergency Department Provider Note   I have reviewed the triage vital signs and the nursing notes.   HISTORY  Chief Complaint Sickle Cell Pain Crisis   HPI [name] is a 36 y.o. male with PMH of SSD presents to the ED for evaluation of continued sickle cell crisis symptoms. Patient has had bilateral rib pain and bilateral ankle pain for the past several days. He is presented to the emergency department today for the third time in last 3 days. No fevers or chills. Denies any pain in the chest or difficulty breathing. Symptoms feel similar to sickle cell crisis pain. No radiation of pain.   Past Medical History:  Diagnosis Date  . Acute chest syndrome ([place]) 06/18/2013  . Acute embolism and thrombosis of right internal jugular vein ([place])   . Alcohol consumption of one to four drinks per day   . Avascular necrosis (HCC)    Right Hip  . Blood transfusion   . Chronic anticoagulation   . Demand ischemia ([place]) 01/02/2014  . Former smoker   . Functional asplenia   . Hb-SS disease with crisis ([place])   . History of Clostridium difficile infection   . History of pulmonary embolus (PE)   . Hypertension   . Hypokalemia   . Leukocytosis    Chronic  . Mood disorder ([place])   . Noncompliance with medication regimen   . Oxygen deficiency   . Pulmonary hypertension ([place])   . Second hand tobacco smoke exposure   . Sickle cell anemia (HCC)   . Sickle-cell crisis with associated acute chest syndrome ([place]) 05/13/2013  . Stroke ([place])   . Thrombocytosis (HCC)    Chronic  . Uses marijuana     Patient Active Problem List   Diagnosis Date Noted  . Respiratory failure, acute and chronic ([place]) 05/14/2016  . Acute pulmonary edema ([place]) 04/11/2016  . H/O arterial ischemic stroke 11/11/2015  . Slurred speech 11/10/2015  . Chronic anemia   . Hb-SS disease without crisis ([place]) 10/07/2014  . Acute on chronic respiratory failure with hypoxia ([place])   . Prolonged Q-T interval on ECG  09/10/2014  . Troponin level elevated 08/29/2014  . Cor pulmonale, chronic ([place]) 08/25/2014  . Sickle cell anemia ([place]) 06/25/2014  . Anemia of chronic disease 06/25/2014  . PAH (pulmonary artery hypertension) ([place]) 03/18/2014  . Chronic respiratory failure with hypoxia ([place]) 03/14/2014  . Paralytic strabismus, external ophthalmoplegia   . Chronic pain syndrome 12/12/2013  . Chronic anticoagulation 08/22/2013  . Essential hypertension 08/22/2013  . Pulmonary HTN ([place]) 06/18/2013  . Functional asplenia   . Vitamin D deficiency 02/13/2013  . Sickle cell crisis ([place]) 01/13/2013  . Rib pain 01/13/2013  . Hx of pulmonary embolus 06/29/2012  . 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 Avascular necrosis ([place])   . Sickle cell pain crisis [facility])     Past Surgical History:  Procedure Laterality Date  . CHOLECYSTECTOMY     01/2008  . Excision of left periauricular cyst     10/2009  . Excision of right ear lobe cyst with primary closur     11/2007  . Porta cath placement    . Porta cath removal    . PORTACATH PLACEMENT  01/05/2012   Procedure: INSERTION PORT-A-CATH;  Surgeon: Odis Hollingshead, MD;  Location: [place];  Service: General;  Laterality: N/A;  ultrasound guiced port a cath insertion with fluoroscopy  . Right hip replacement     08/2006  . UMBILICAL HERNIA REPAIR  01/2008      Allergies Patient has no known allergies.  Family History  Problem Relation Age of Onset  . Sickle cell trait Mother   . Depression Mother   . Diabetes Mother   . Sickle cell trait Father   . Sickle cell trait Brother     Social History Social History  Substance Use Topics  . Smoking status: Former Smoker    Packs/day: 0.50    Years: 10.00    Types: Cigarettes    Quit date: 05/29/2011  . Smokeless tobacco: Never Used  . Alcohol use No    Review of Systems  Constitutional: No fever/chills Eyes: No visual changes. ENT: No sore throat. Cardiovascular: Denies chest pain.    Respiratory: Denies shortness of breath. Gastrointestinal: No abdominal pain.  No nausea, no vomiting.  No diarrhea.  No constipation. Genitourinary: Negative for dysuria. Musculoskeletal: Negative for back pain. Positive bilateral rib pain and ankle pain.  Skin: Negative for rash. Neurological: Negative for headaches, focal weakness or numbness.  10-point ROS otherwise negative.  ____________________________________________   PHYSICAL EXAM:  VITAL SIGNS: ED Triage Vitals  Enc Vitals Group     BP 05/30/16 1649 117/79     Pulse Rate 05/30/16 1649 86     Resp 05/30/16 1649 15     Temp 05/30/16 1649 98.2 F (36.8 C)     Temp Source 05/30/16 1649 Oral     SpO2 05/30/16 1649 98 %     Weight 05/30/16 1719 200 lb (90.7 kg)     Height 05/30/16 1719 6' (1.829 m)     Pain Score 05/30/16 1651 8   Constitutional: Alert and oriented. Well appearing and in no acute distress. Eyes: Conjunctivae are normal.  Head: Atraumatic. Nose: No congestion/rhinnorhea. Mouth/Throat: Mucous membranes are moist.  Oropharynx non-erythematous. Neck: No stridor.   Cardiovascular: Normal rate, regular rhythm. Good peripheral circulation. Grossly normal heart sounds.   Respiratory: Normal respiratory effort.  No retractions. Lungs CTAB. Gastrointestinal: Soft and nontender. No distention.  Musculoskeletal: No lower extremity tenderness nor edema. No gross deformities of extremities. Mild tenderness to palpation of the bilateral lateral chest wall. Full ROM of bilateral ankles with no overlying erythema.  Neurologic:  Normal speech and language. No gross focal neurologic deficits are appreciated.  Skin:  Skin is warm, dry and intact. No rash noted.   ____________________________________________   LABS (all labs ordered are listed, but only abnormal results are displayed)  Labs Reviewed  CBC WITH DIFFERENTIAL/PLATELET - Abnormal; Notable for the following:       Result Value   WBC 13.6 (*)    RBC  2.57 (*)    Hemoglobin 7.6 (*)    HCT 22.8 (*)    RDW 19.5 (*)    Platelets 621 (*)    Neutro Abs 9.0 (*)    Monocytes Absolute 1.9 (*)    All other components within normal limits  BASIC METABOLIC PANEL - Abnormal; Notable for the following:    Glucose, Bld 118 (*)    All other components within normal limits  RETICULOCYTES - Abnormal; Notable for the following:    Retic Ct Pct 7.4 (*)    RBC. 2.57 (*)    Retic Count, Manual 190.2 (*)    All other components within normal limits  MAGNESIUM - Abnormal; Notable for the following:    Magnesium 1.6 (*)    All other components within normal limits  CBC - Abnormal; Notable for the following:    WBC  13.8 (*)    RBC 2.70 (*)    Hemoglobin 7.9 (*)    HCT 24.4 (*)    RDW 19.4 (*)    Platelets 579 (*)    All other components within normal limits  BASIC METABOLIC PANEL - Abnormal; Notable for the following:    Glucose, Bld 139 (*)    All other components within normal limits  PHOSPHORUS   ____________________________________________  RADIOLOGY  None. Recent CXR reviewed.  ____________________________________________   PROCEDURES  Procedure(s) performed:   Procedures  None ____________________________________________   INITIAL IMPRESSION / ASSESSMENT AND PLAN / ED COURSE  Pertinent labs & imaging results that were available during my care of the patient were reviewed by me and considered in my medical decision making (see chart for details).  Patient presents to the ED with continued SSC pain in the chest wall and bilateral ankles. This is his 3rd ED presentation in 3 days for similar pain. No CP or difficulty breathing. No hypoxemia. Plan for pain medication, IVF, and reassessment. Labs drawn the last 3 days so will not today. No repeat CXR or EKG after review of prior notes and imaging.   Patient not feeling improved after two pain medication doses and IVF. Toradol also given. Discussed intractable symptoms with the  patient and recommended admission. Patient in agreement.  Discussed patient's case with hospitalist. Patient and family (if present) updated with plan. Care transferred to hospitalist service. Ordered lab work per their request.   I reviewed all nursing notes, vitals, pertinent old records, EKGs, labs, imaging (as available).   ____________________________________________  FINAL CLINICAL IMPRESSION(S) / ED DIAGNOSES  Final diagnoses:  Sickle cell pain crisis ([place])     MEDICATIONS GIVEN DURING THIS VISIT:  Medications  lisinopril (PRINIVIL,ZESTRIL) tablet 2.5 mg (not administered)  carvedilol (COREG) tablet 3.125 mg (3.125 mg Oral Given 05/31/16 0755)  morphine (MS CONTIN) 12 hr tablet 30 mg (30 mg Oral Given 0/16/01 0932)  folic acid (FOLVITE) tablet 2 mg (not administered)  hydroxyurea (HYDREA) capsule 1,000 mg (not administered)  ambrisentan (LETAIRIS) tablet 5 mg (not administered)  gabapentin (NEURONTIN) capsule 300 mg (300 mg Oral Given 05/30/16 2243)  naloxone (NARCAN) injection 0.4 mg (not administered)    And  sodium chloride flush (NS) 0.9 % injection 9 mL (not administered)  ondansetron (ZOFRAN) injection 4 mg (not administered)  diphenhydrAMINE (BENADRYL) capsule 25-50 mg (50 mg Oral Given 05/31/16 0021)    Or  diphenhydrAMINE (BENADRYL) 25 mg in sodium chloride 0.9 % 50 mL IVPB ( Intravenous See Alternative 05/31/16 0021)  dextrose 5 %-0.45 % sodium chloride infusion ( Intravenous New Bag/Given 05/30/16 2229)  senna-docusate (Senokot-S) tablet 1 tablet (1 tablet Oral Given 05/30/16 2243)  polyethylene glycol (MIRALAX / GLYCOLAX) packet 17 g (not administered)  HYDROmorphone (DILAUDID) 1 mg/mL PCA injection (4.19 mg Intravenous Received 05/31/16 0800)  ketorolac (TORADOL) 30 MG/ML injection 30 mg (30 mg Intravenous Given 05/31/16 0502)  magnesium citrate solution 1 Bottle (not administered)  bisacodyl (DULCOLAX) suppository 10 mg (not administered)  hydroxyurea (HYDREA) capsule  500 mg (not administered)  chlorhexidine (PERIDEX) 0.12 % solution 15 mL (15 mLs Mouth/Throat Given 05/30/16 2244)  zolpidem (AMBIEN) tablet 10 mg (10 mg Oral Given 05/31/16 0015)  rivaroxaban (XARELTO) tablet 20 mg (not administered)  ketorolac (TORADOL) 30 MG/ML injection 30 mg (30 mg Intravenous Given 05/30/16 1758)  HYDROmorphone (DILAUDID) injection 2 mg (2 mg Intravenous Given 05/30/16 1741)    Or  HYDROmorphone (DILAUDID) injection 2 mg ( Subcutaneous See  Alternative 05/30/16 1741)  HYDROmorphone (DILAUDID) injection 2 mg (2 mg Intravenous Given 05/30/16 1821)    Or  HYDROmorphone (DILAUDID) injection 2 mg ( Subcutaneous See Alternative 05/30/16 1821)  diphenhydrAMINE (BENADRYL) capsule 50 mg (50 mg Oral Given 05/30/16 1759)  HYDROmorphone (DILAUDID) injection 2 mg (2 mg Intravenous Given 05/30/16 1950)    Or  HYDROmorphone (DILAUDID) injection 2 mg ( Subcutaneous See Alternative 05/30/16 1950)     NEW OUTPATIENT MEDICATIONS STARTED DURING THIS VISIT:  None   Note:  This document was prepared using Dragon voice recognition software and may include unintentional dictation errors.  Nanda Quinton, MD Emergency Medicine   Margette Fast, MD 05/31/16 [phone]

## 2016-05-31 DIAGNOSIS — I2721 Secondary pulmonary arterial hypertension: Secondary | ICD-10-CM

## 2016-05-31 DIAGNOSIS — G894 Chronic pain syndrome: Secondary | ICD-10-CM

## 2016-05-31 DIAGNOSIS — I10 Essential (primary) hypertension: Secondary | ICD-10-CM

## 2016-05-31 DIAGNOSIS — Z86711 Personal history of pulmonary embolism: Secondary | ICD-10-CM

## 2016-05-31 DIAGNOSIS — D5700 Hb-SS disease with crisis, unspecified: Principal | ICD-10-CM

## 2016-05-31 LAB — MAGNESIUM: Magnesium: 1.6 mg/dL — ABNORMAL LOW (ref 1.7–2.4)

## 2016-05-31 LAB — DIFFERENTIAL
Basophils Absolute: 0.1 10*3/uL (ref 0.0–0.1)
Basophils Relative: 1 %
Eosinophils Absolute: 0.5 10*3/uL (ref 0.0–0.7)
Eosinophils Relative: 3 %
Lymphocytes Relative: 18 %
Lymphs Abs: 2.6 10*3/uL (ref 0.7–4.0)
Monocytes Absolute: 2 10*3/uL — ABNORMAL HIGH (ref 0.1–1.0)
Monocytes Relative: 14 %
Neutro Abs: 9.4 10*3/uL — ABNORMAL HIGH (ref 1.7–7.7)
Neutrophils Relative %: 64 %

## 2016-05-31 LAB — CBC
HCT: 24.4 % — ABNORMAL LOW (ref 39.0–52.0)
Hemoglobin: 7.9 g/dL — ABNORMAL LOW (ref 13.0–17.0)
MCH: 29.3 pg (ref 26.0–34.0)
MCHC: 32.4 g/dL (ref 30.0–36.0)
MCV: 90.4 fL (ref 78.0–100.0)
Platelets: 579 10*3/uL — ABNORMAL HIGH (ref 150–400)
RBC: 2.7 MIL/uL — ABNORMAL LOW (ref 4.22–5.81)
RDW: 19.4 % — ABNORMAL HIGH (ref 11.5–15.5)
WBC: 13.8 10*3/uL — ABNORMAL HIGH (ref 4.0–10.5)

## 2016-05-31 LAB — BASIC METABOLIC PANEL
Anion gap: 9 (ref 5–15)
BUN: 8 mg/dL (ref 6–20)
CO2: 22 mmol/L (ref 22–32)
Calcium: 9 mg/dL (ref 8.9–10.3)
Chloride: 105 mmol/L (ref 101–111)
Creatinine, Ser: 1.17 mg/dL (ref 0.61–1.24)
GFR calc Af Amer: >60 mL/min (ref >60)
GFR calc non Af Amer: >60 mL/min (ref >60)
Glucose, Bld: 139 mg/dL — ABNORMAL HIGH (ref 65–99)
Potassium: 3.6 mmol/L (ref 3.5–5.1)
Sodium: 136 mmol/L (ref 135–145)

## 2016-05-31 LAB — RETICULOCYTES
RBC.: 2.65 MIL/uL — AB (ref 4.22–5.81)
RETIC CT PCT: 8.3 % — AB (ref 0.4–3.1)
Retic Count, Absolute: 220 10*3/uL — ABNORMAL HIGH (ref 19.0–186.0)

## 2016-05-31 LAB — PHOSPHORUS: Phosphorus: 4.4 mg/dL (ref 2.5–4.6)

## 2016-05-31 MED ORDER — DEXTROSE-NACL 5-0.45 % IV SOLN: INTRAVENOUS | Status: DC

## 2016-05-31 MED ORDER — RIVAROXABAN 20 MG PO TABS
20.0000 mg | ORAL_TABLET | Freq: Every day | ORAL | Status: DC
  Administered 2016-05-31 – 2016-06-04 (×5): 20 mg via ORAL
  Filled 2016-05-31 (×5): qty 1

## 2016-05-31 MED ORDER — DEXTROSE-NACL 5-0.45 % IV SOLN
INTRAVENOUS | Status: AC
Start: 2016-05-31 — End: 2016-06-04

## 2016-05-31 NOTE — Progress Notes (Signed)
SICKLE CELL SERVICE PROGRESS NOTE  [name] [name] DOB: 09-17-1980 DOA: 05/30/2016 PCP: [name], MD  Assessment/Plan: Active Problems:   Sickle cell pain crisis ([place])   Hx of pulmonary embolus   Sickle cell crisis (HCC)   Essential hypertension   Chronic pain syndrome   PAH (pulmonary artery hypertension) (HCC)   Prolonged Q-T interval on ECG   1. Hb SS with crisis: Continue PCA, Toradol and IVF at [facility]. Re-assess pain tomorrow.  2. Chronic Respiratory Failure with Hypoxia: Currently at baseline 3. Anemia of chronic Disease: Baseline Hb after Red Cell Pheresis was 7.8. Currently at baseline.  4. PAH: Letairis was discontinued after last hospitalization at [facility]. Will D/C.  5. Chronic Leukocytosis: WBC at baseline 6. Secondary Hemochromatosis: Pt refusing Jadenu on advice from his mother.   Code Status: Full Code Family Communication: N/A Disposition Plan: Not yet ready for discharge  [place].  Pager [phone]. If 7PM-7AM, please contact night-coverage.  05/31/2016, 12:10 PM  LOS: 1 day   Interim History: Pt reports painin low back and ribs at an intensity of 7/10. He ahs used 21.6 mg of Dilaudid with 41/36:demands/deliveries since admission.   Consultants:  None  Procedures:  None  Antibiotics:  None   Objective: Vitals:   05/31/16 0607 05/31/16 0756 05/31/16 1028 05/31/16 1127  BP: 114/79  120/82   Pulse: 91  88   Resp: (!) 24 20 18 20   Temp: 98.5 F (36.9 C)  98.1 F (36.7 C)   TempSrc: Oral  Oral   SpO2: 93% 95% 92% 94%  Weight: 83.1 kg (183 lb 3.2 oz)     Height:       Weight change:   Intake/Output Summary (Last 24 hours) at 05/31/16 1210 Last data filed at 05/31/16 1028  Gross per 24 hour  Intake              120 ml  Output              300 ml  Net             -180 ml     Physical Exam General: Alert, awake, oriented x3, in no acute distress.  HEENT: [place]/AT PEERL, EOMI, anciteric Neck: Trachea midline,   no masses, no thyromegal,y no JVD, no carotid bruit OROPHARYNX:  Moist, No exudate/ erythema/lesions.  Heart: Regular rate and rhythm, without murmurs, rubs, gallops, PMI non-displaced, no heaves or thrills on palpation.  Lungs: Clear to auscultation, no wheezing or rhonchi noted. No increased vocal fremitus resonant to percussion  Abdomen: Soft, nontender, nondistended, positive bowel sounds, no masses no hepatosplenomegaly noted..  Neuro: No focal neurological deficits noted cranial nerves II through XII grossly intact.  Strength at baseline in bilateral upper and lower extremities. Musculoskeletal: No warmth swelling or erythema around joints, no spinal tenderness noted. Psychiatric: Patient alert and oriented x3, good insight and cognition, good recent to remote recall.     Data Reviewed: Basic Metabolic Panel:  Recent Labs Lab 05/28/16 0506 05/29/16 1010 05/30/16 2040 05/31/16 0520  NA 137 136 135 136  K 3.9 4.0 3.5 3.6  CL 107 108 107 105  CO2 23 23 22 22   GLUCOSE 105* 103* 118* 139*  BUN 25* 11 7 8   CREATININE 1.13 0.86 0.89 1.17  CALCIUM 8.9 9.0 9.0 9.0  MG  --   --   --  1.6*  PHOS  --   --   --  4.4   Liver Function Tests:  Recent Labs Lab 05/28/16 0506  AST 37  ALT 20  ALKPHOS 85  BILITOT 3.8*  PROT 7.8  ALBUMIN 4.2   No results for input(s): LIPASE, AMYLASE in the last 168 hours. No results for input(s): AMMONIA in the last 168 hours. CBC:  Recent Labs Lab 05/28/16 0506 05/29/16 1010 05/30/16 2040 05/31/16 0520  WBC 11.1* 10.7* 13.6* 13.8*  NEUTROABS 7.7 8.1* 9.0* 9.4*  HGB 7.4* 7.4* 7.6* 7.9*  HCT 21.4* 21.8* 22.8* 24.4*  MCV 87.0 88.6 88.7 90.4  PLT 553* 569* 621* 579*   Cardiac Enzymes: No results for input(s): CKTOTAL, CKMB, CKMBINDEX, TROPONINI in the last 168 hours. BNP (last 3 results)  Recent Labs  04/21/16 1139 05/05/16 1206 05/16/16 1724  BNP 321.8* 639.0* 299.8*    ProBNP (last 3 results) No results for input(s): PROBNP  in the last 8760 hours.  CBG: No results for input(s): GLUCAP in the last 168 hours.  No results found for this or any previous visit (from the past 240 hour(s)).   Studies: Dg Chest 2 View  Result Date: 05/28/2016 CLINICAL DATA:  36 year old male with sickle cell anemia presenting with bilateral rib pain. EXAM: CHEST  2 VIEW COMPARISON:  Chest radiograph dated 05/14/2016 FINDINGS: Left pectoral subclavian approach infusion catheter with tip over the right atrial silhouette adjacent to the cavoatrial junction. There is stable cardiomegaly with mild vascular congestion. No new consolidative changes. There is no pleural effusion or pneumothorax. No acute osseous pathology. IMPRESSION: 1. No focal consolidation. 2. Cardiomegaly with probable mild vascular congestion. Electronically Signed   By: Anner Crete M.D.   On: 05/28/2016 04:44   Ct Angio Chest Pe W Or Wo Contrast  Result Date: 05/06/2016 CLINICAL DATA:  Hypoxia, history of sickle cell EXAM: CT ANGIOGRAPHY CHEST WITH CONTRAST TECHNIQUE: Multidetector CT imaging of the chest was performed using the standard protocol during bolus administration of intravenous contrast. Multiplanar CT image reconstructions and MIPs were obtained to evaluate the vascular anatomy. CONTRAST:  75 cc of Isovue 370 IV COMPARISON:  CXR 04/29/2016, chest CT 03/13/2016 FINDINGS: Cardiovascular: Cardiomegaly. Port catheter tip in the cavoatrial junction. Small pericardial effusion. No acute pulmonary embolus. To the third order branches. No aortic aneurysm or dissection. Mediastinum/Nodes: Mild retrosternal extension of the thyroid gland without focal mass. Trachea and esophagus demonstrate no acute findings. Multiple small prevascular, paratracheal and hilar nodes are again seen without pathologic appearing enlargement. Lungs/Pleura: Perihilar distribution of ground-glass opacities suspicious for mild pulmonary edema or alveolitis secondary to infection or inflammation. No  significant pleural effusion or pneumothorax. Upper Abdomen: No acute abnormality in the upper abdomen. 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 Musculoskeletal: Central endplate compressions of several mid thoracic vertebrae consistent with history of sickle cell. No acute osseous appearing abnormality. Review of the MIP images confirms the above findings. IMPRESSION: Stable cardiomegaly with mild perihilar ground-glass opacities suspicious for pulmonary edema with differential possibilities including alveolitis possibly from an infectious or inflammatory process. No acute pulmonary embolus. Auto infarcted spleen. H- shaped vertebrae in the mid thoracic spine consistent with history of sickle cell. Electronically Signed   By: Ashley Royalty M.D.   On: 05/06/2016 19:11   Dg Chest Port 1 View  Result Date: 05/14/2016 CLINICAL DATA:  Acute chest pain.  History of sickle cell anemia. EXAM: PORTABLE CHEST 1 VIEW COMPARISON:  04/29/2016 and prior exams FINDINGS: Cardiomegaly and mild vascular congestion noted. A left Port-A-Cath is present with tip overlying the superior cavoatrial junction. There  is no evidence of focal airspace disease, pulmonary edema, suspicious pulmonary nodule/mass, pleural effusion, or pneumothorax. No acute bony abnormalities are identified. Left humeral head AVN again noted. IMPRESSION: Cardiomegaly and mild pulmonary vascular congestion. Electronically Signed   By: Margarette Canada M.D.   On: 05/14/2016 10:23    Scheduled Meds: . ambrisentan  5 mg Oral Daily  . carvedilol  3.125 mg Oral BID WC  . chlorhexidine  15 mL Mouth/Throat BID  . folic acid  2 mg Oral Daily  . gabapentin  300 mg Oral TID  . HYDROmorphone   Intravenous Q4H  . hydroxyurea  1,000 mg Oral QODAY  . [START ON 06/01/2016] hydroxyurea  500 mg Oral QODAY  . ketorolac  30 mg Intravenous Q6H  . lisinopril  2.5 mg Oral Daily  . morphine  30 mg Oral Q12H  . rivaroxaban  20 mg Oral Q  supper  . senna-docusate  1 tablet Oral BID   Continuous Infusions: . dextrose 5 % and 0.45% NaCl 75 mL/hr at 05/30/16 2229  . diphenhydrAMINE (BENADRYL) IVPB(SICKLE CELL ONLY)      Active Problems:   Sickle cell pain crisis (HCC)   Hx of pulmonary embolus   Sickle cell crisis (HCC)   Essential hypertension   Chronic pain syndrome   PAH (pulmonary artery hypertension) (HCC)   Prolonged Q-T interval on ECG    In excess of 30 minutes spent during this visit. Greater than 50% involved face to face contact with the patient for assessment, counseling and coordination of care.

## 2016-05-31 NOTE — Consult Note (Signed)
   [facility] CM Inpatient Consult   05/31/2016  [name] Jan 06, 1981 [id number]    Patient screened for [place] [facility] services. Spoke with Dr. Zigmund Daniel who indicates that [name] needs additional follow up for care coordination services. He has multiple co-morbidities, including his sickle cell. His health has been on the decline as of late. He has also been referred to [facility]. He has several outpatient upcoming appointments scheduled at [facility] as well.   Spoke with patient to discuss that [place] Management program services. He is agreeable. Confirmed best contact number as [phone]. Denies trouble with obtaining his medications or with transportation. Written consent obtained and [facility] Care Management packet provided. Confirmed Primary Care MD is Dr. Doreene Burke.  Encouraged [name] to please answer and return calls from [place] for engagement. He states " I will try".   Will refer to [facility] for care coordination needs. Will make inpatient RNCM aware.   Marthenia Rolling, MSN-Ed, RN,BSN [facility] Liaison [phone]

## 2016-06-01 MED ORDER — HYDROMORPHONE HCL 4 MG PO TABS
4.0000 mg | ORAL_TABLET | ORAL | Status: DC
  Administered 2016-06-01 – 2016-06-05 (×23): 4 mg via ORAL
  Filled 2016-06-01 (×23): qty 1

## 2016-06-01 NOTE — Progress Notes (Signed)
1936 After walking around the unit VS BP 122/72 P 101 R 16 ,O2 91 3 L

## 2016-06-01 NOTE — Progress Notes (Signed)
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 Hb SS with crisis: Schedule oral Dilaudid. Continue Toradol and  PCA, for PRN use. Continue IVF at [facility]. Re-assess for possible discharge tomorrow.  2. Chronic Respiratory Failure with Hypoxia: Currently at baseline. Continue Oxygen supplementation.  3. Anemia of chronic Disease: Baseline Hb after Red Cell Pheresis was 7.8. Currently at baseline.  4. PAH: Letairis was discontinued after last hospitalization at [facility]. Will D/C.  5. Chronic Leukocytosis: WBC at baseline 6. Secondary Hemochromatosis: Pt refusing Jadenu on advice from his mother.   Code Status: Full Code Family Communication: N/A Disposition Plan: Not yet ready for discharge  [place].  Pager [phone]. If 7PM-7AM, please contact night-coverage.  06/01/2016, 4:54 PM  LOS: 2 days   Interim History: Pt reports painin low back and ribs at an intensity of 7/10. He ahs used 21.6 mg of Dilaudid with 41/36:demands/deliveries since admission.   Consultants:  None  Procedures:  None  Antibiotics:  None   Objective: Vitals:   06/01/16 1100 06/01/16 1155 06/01/16 1520 06/01/16 1606  BP: 112/67  123/77   Pulse: 82  86   Resp: 14 16 16 19   Temp: 98.2 F (36.8 C)  98 F (36.7 C)   TempSrc: Oral  Oral   SpO2: 95% 92% 95% 96%  Weight:      Height:       Weight change:   Intake/Output Summary (Last 24 hours) at 06/01/16 1654 Last data filed at 06/01/16 1521  Gross per 24 hour  Intake              660 ml  Output              975 ml  Net             -315 ml     Physical Exam General: Alert, awake, oriented x3, in no acute  distress.  HEENT: [place]/AT PEERL, EOMI, anciteric Neck: Trachea midline,  no masses, no thyromegal,y no JVD, no carotid bruit OROPHARYNX:  Moist, No exudate/ erythema/lesions.  Heart: Regular rate and rhythm, without murmurs, rubs, gallops, PMI non-displaced, no heaves or thrills on palpation.  Lungs: Clear to auscultation, no wheezing or rhonchi noted. No increased vocal fremitus resonant to percussion  Abdomen: Soft, nontender, nondistended, positive bowel sounds, no masses no hepatosplenomegaly noted..  Neuro: No focal neurological deficits noted cranial nerves II through XII grossly intact.  Strength at baseline in bilateral upper and lower extremities. Musculoskeletal: No warmth swelling or erythema around joints, no spinal tenderness noted. Psychiatric: Patient alert and oriented x3, good insight and cognition, good recent to remote recall.     Data Reviewed: Basic Metabolic Panel:  Recent Labs Lab 05/28/16 0506 05/29/16 1010 05/30/16 2040 05/31/16 0520  NA 137 136 135 136  K 3.9 4.0 3.5 3.6  CL 107 108 107 105  CO2 23 23 22 22   GLUCOSE 105* 103* 118* 139*  BUN 25* 11 7 8   CREATININE 1.13 0.86 0.89 1.17  CALCIUM 8.9 9.0 9.0 9.0  MG  --   --   --  1.6*  PHOS  --   --   --  4.4   Liver Function Tests:  Recent Labs Lab 05/28/16 0506  AST 37  ALT 20  ALKPHOS 85  BILITOT 3.8*  PROT 7.8  ALBUMIN 4.2   No results for input(s): LIPASE, AMYLASE in the last 168 hours. No results for input(s): AMMONIA in the last 168 hours. CBC:  Recent Labs Lab 05/28/16 0506 05/29/16 1010 05/30/16 2040 05/31/16 0520  WBC 11.1* 10.7* 13.6* 13.8*  NEUTROABS 7.7 8.1* 9.0* 9.4*  HGB 7.4* 7.4* 7.6* 7.9*  HCT 21.4* 21.8* 22.8* 24.4*  MCV 87.0 88.6 88.7 90.4  PLT 553* 569* 621* 579*   Cardiac Enzymes: No results for input(s): CKTOTAL, CKMB, CKMBINDEX, TROPONINI in the last 168 hours. BNP (last 3 results)  Recent Labs  04/21/16 1139 05/05/16 1206 05/16/16 1724  BNP 321.8*  639.0* 299.8*    ProBNP (last 3 results) No results for input(s): PROBNP in the last 8760 hours.  CBG: No results for input(s): GLUCAP in the last 168 hours.  No results found for this or any previous visit (from the past 240 hour(s)).   Studies: Dg Chest 2 View  Result Date: 05/28/2016 CLINICAL DATA:  36 year old male with sickle cell anemia presenting with bilateral rib pain. EXAM: CHEST  2 VIEW COMPARISON:  Chest radiograph dated 05/14/2016 FINDINGS: Left pectoral subclavian approach infusion catheter with tip over the right atrial silhouette adjacent to the cavoatrial junction. There is stable cardiomegaly with mild vascular congestion. No new consolidative changes. There is no pleural effusion or pneumothorax. No acute osseous pathology. IMPRESSION: 1. No focal consolidation. 2. Cardiomegaly with probable mild vascular congestion. Electronically Signed   By: Anner Crete M.D.   On: 05/28/2016 04:44   Ct Angio Chest Pe W Or Wo Contrast  Result Date: 05/06/2016 CLINICAL DATA:  Hypoxia, history of sickle cell EXAM: CT ANGIOGRAPHY CHEST WITH CONTRAST TECHNIQUE: Multidetector CT imaging of the chest was performed using the standard protocol during bolus administration of intravenous contrast. Multiplanar CT image reconstructions and MIPs were obtained to evaluate the vascular anatomy. CONTRAST:  75 cc of Isovue 370 IV COMPARISON:  CXR 04/29/2016, chest CT 03/13/2016 FINDINGS: Cardiovascular: Cardiomegaly. Port catheter tip in the cavoatrial junction. Small pericardial effusion. No acute pulmonary embolus. To the third order branches. No aortic aneurysm or dissection. Mediastinum/Nodes: Mild retrosternal extension of the thyroid gland without focal mass. Trachea and esophagus demonstrate no acute findings. Multiple small prevascular, paratracheal and hilar nodes are again seen without pathologic appearing enlargement. Lungs/Pleura: Perihilar distribution of ground-glass opacities suspicious for  mild pulmonary edema or alveolitis secondary to infection or inflammation. No significant pleural effusion or pneumothorax. Upper Abdomen: No acute abnormality in the upper abdomen. 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 Musculoskeletal: Central endplate compressions of several mid thoracic vertebrae consistent with history of sickle cell. No acute osseous appearing abnormality. Review of the MIP images confirms the above findings. IMPRESSION: Stable cardiomegaly with mild perihilar ground-glass opacities suspicious for pulmonary edema with differential possibilities including alveolitis possibly from an infectious or inflammatory process. No acute pulmonary embolus. Auto infarcted spleen. H- shaped vertebrae in the mid thoracic spine consistent with history of sickle cell. Electronically Signed   By: Ashley Royalty M.D.   On: 05/06/2016 19:11   Dg Chest Port 1 View  Result Date: 05/14/2016 CLINICAL DATA:  Acute chest pain.  History of sickle cell anemia. EXAM: PORTABLE CHEST 1 VIEW COMPARISON:  04/29/2016 and prior exams FINDINGS: Cardiomegaly and mild vascular congestion noted. A left Port-A-Cath is present with  tip overlying the superior cavoatrial junction. There is no evidence of focal airspace disease, pulmonary edema, suspicious pulmonary nodule/mass, pleural effusion, or pneumothorax. No acute bony abnormalities are identified. Left humeral head AVN again noted. IMPRESSION: Cardiomegaly and mild pulmonary vascular congestion. Electronically Signed   By: Margarette Canada M.D.   On: 05/14/2016 10:23    Scheduled Meds: . carvedilol  3.125 mg Oral BID WC  . chlorhexidine  15 mL Mouth/Throat BID  . folic acid  2 mg Oral Daily  . gabapentin  300 mg Oral TID  . HYDROmorphone   Intravenous Q4H  . hydroxyurea  1,000 mg Oral QODAY  . hydroxyurea  500 mg Oral QODAY  . ketorolac  30 mg Intravenous Q6H  . lisinopril  2.5 mg Oral Daily  . morphine  30 mg Oral Q12H  .  rivaroxaban  20 mg Oral Q supper  . senna-docusate  1 tablet Oral BID   Continuous Infusions: . dextrose 5 % and 0.45% NaCl 10 mL/hr at 05/31/16 1700  . diphenhydrAMINE (BENADRYL) IVPB(SICKLE CELL ONLY)      Active Problems:   Sickle cell pain crisis (HCC)   Hx of pulmonary embolus   Sickle cell crisis (HCC)   Essential hypertension   Chronic pain syndrome   PAH (pulmonary artery hypertension) (HCC)   Prolonged Q-T interval on ECG    In excess of 30 minutes spent during this visit. Greater than 50% involved face to face contact with the patient for assessment, counseling and coordination of care.

## 2016-06-01 NOTE — Progress Notes (Signed)
[place] [name] pre walk VS BP 119/64   P 86 R 18  O2 SAT 91 3 L

## 2016-06-02 ENCOUNTER — Other Ambulatory Visit: Payer: Self-pay | Admitting: Internal Medicine

## 2016-06-02 ENCOUNTER — Telehealth: Payer: Self-pay

## 2016-06-02 DIAGNOSIS — F5101 Primary insomnia: Secondary | ICD-10-CM

## 2016-06-02 DIAGNOSIS — J9611 Chronic respiratory failure with hypoxia: Secondary | ICD-10-CM

## 2016-06-02 LAB — CBC WITH DIFFERENTIAL/PLATELET
BASOS ABS: 0.1 10*3/uL (ref 0.0–0.1)
BASOS PCT: 1 %
Eosinophils Absolute: 0.7 10*3/uL (ref 0.0–0.7)
Eosinophils Relative: 6 %
HEMATOCRIT: 20.7 % — AB (ref 39.0–52.0)
HEMOGLOBIN: 7 g/dL — AB (ref 13.0–17.0)
LYMPHS PCT: 16 %
Lymphs Abs: 1.9 10*3/uL (ref 0.7–4.0)
MCH: 30.3 pg (ref 26.0–34.0)
MCHC: 33.8 g/dL (ref 30.0–36.0)
MCV: 89.6 fL (ref 78.0–100.0)
Monocytes Absolute: 1.6 10*3/uL — ABNORMAL HIGH (ref 0.1–1.0)
Monocytes Relative: 13 %
NEUTROS ABS: 8 10*3/uL — AB (ref 1.7–7.7)
NEUTROS PCT: 64 %
Platelets: 594 10*3/uL — ABNORMAL HIGH (ref 150–400)
RBC: 2.31 MIL/uL — ABNORMAL LOW (ref 4.22–5.81)
RDW: 20 % — ABNORMAL HIGH (ref 11.5–15.5)
WBC: 12.3 10*3/uL — ABNORMAL HIGH (ref 4.0–10.5)

## 2016-06-02 LAB — RETICULOCYTES
RBC.: 2.31 MIL/uL — AB (ref 4.22–5.81)
RETIC COUNT ABSOLUTE: 210.2 10*3/uL — AB (ref 19.0–186.0)
Retic Ct Pct: 9.1 % — ABNORMAL HIGH (ref 0.4–3.1)

## 2016-06-02 MED ORDER — HYDROMORPHONE HCL 4 MG PO TABS: 8.0000 mg | ORAL_TABLET | ORAL | 0 refills | Status: DC | PRN

## 2016-06-02 MED ORDER — MORPHINE SULFATE ER 30 MG PO TBCR: 30.0000 mg | EXTENDED_RELEASE_TABLET | Freq: Two times a day (BID) | ORAL | 0 refills | Status: DC

## 2016-06-02 MED ORDER — ZOLPIDEM TARTRATE 10 MG PO TABS: 10.0000 mg | ORAL_TABLET | Freq: Every evening | ORAL | 0 refills | Status: DC | PRN

## 2016-06-02 NOTE — Progress Notes (Signed)
Pt's O2 saturation checked on 3L, at rest and ambulating. At rest, 92%. Ambulatory, 89%. Pt tolerated well.

## 2016-06-02 NOTE — Consult Note (Signed)
   [facility] CM Inpatient Consult   06/02/2016  DEANDREA RION 1980/07/12 [id number]    [facility] Care Management follow up. Chart reviewed. Remains inpatient. [facility] Care Management to follow post hospital discharge. Notification sent to make inpatient RNCM aware.  Marthenia Rolling, MSN-Ed, RN,BSN [facility] Liaison [phone]

## 2016-06-02 NOTE — Progress Notes (Signed)
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 Hb SS with Crisis: Continue Scheduled Oxycodone, PCA and Toradol.  2. Chronic Respiratory Failure with Hypoxia: At baseline. Continue Oxygen supplementation.  3. Anemia of Chronic Disease: Mild decrease in Hb but with adequate reticulocytosis. 4. 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 Continue Xarelto.  5. Essential HTN: BP well controlled/ 6. Chronic Leukocytosis: At baseline 7. Secondary Hemochromatosis: Pt has discontinued Jadenu on his mothers advice    Code Status: Full Code Family Communication: N/A Disposition Plan: Hopefully to discharge tomorrow  [place]  Pager [phone]. If 7PM-7AM, please contact night-coverage.  06/02/2016, 2:40 PM  LOS: 3 days   Interim History: Pt reports that needle was out of port for about 5 hours during the night. Thus his pain was increased this morning. Currently his pain is at 7/10. He has used 32.9 mg of Dilaudid with 60/65:demands/deliveries on the PCA pump.   Consultants:  None  Procedures:  None  Antibiotics:  None    Objective: Vitals:   06/02/16 0435 06/02/16 0834 06/02/16 1035 06/02/16 1218  BP:   115/71   Pulse:   88   Resp: 20 (!) 22 20 20   Temp:   98.5 F (36.9 C)   TempSrc:   Oral   SpO2: 92% 91% 94% 90%  Weight:      Height:       Weight change:   Intake/Output Summary (Last 24 hours) at 06/02/16 1440 Last data filed at 06/02/16 1035  Gross per 24 hour  Intake            775.5 ml  Output             1025 ml  Net           -249.5 ml         Physical Exam  General: Alert, awake, oriented x3, in no acute  distress.  HEENT: [place]/AT PEERL, EOMI, anicteric Neck: Trachea midline,  no masses, no thyromegal,y no JVD, no carotid bruit OROPHARYNX:  Moist, No exudate/ erythema/lesions.  Heart: Regular rate and rhythm, without murmurs, rubs, gallops, PMI non-displaced, no heaves or thrills on palpation.  Lungs: Clear to auscultation, no wheezing or rhonchi noted. No increased vocal fremitus resonant to percussion  Abdomen: Soft, nontender, nondistended, positive bowel sounds, no masses no hepatosplenomegaly noted..  Neuro: No focal neurological deficits noted cranial nerves II through XII grossly intact.  Strength at functional baseline in bilateral upper and lower extremities. Musculoskeletal: No warmth swelling or erythema around joints, no spinal tenderness noted. Gait normal. Psychiatric: Patient alert and oriented x3, good insight and cognition, good recent to remote recall.    Data Reviewed: Basic Metabolic Panel:  Recent Labs Lab 05/28/16 0506 05/29/16 1010 05/30/16 2040 05/31/16 0520  NA 137 136 135 136  K 3.9 4.0 3.5 3.6  CL 107 108 107 105  CO2 23 23 22 22   GLUCOSE 105* 103* 118* 139*  BUN 25* 11 7 8   CREATININE 1.13 0.86 0.89 1.17  CALCIUM 8.9 9.0 9.0 9.0  MG  --   --   --  1.6*  PHOS  --   --   --  4.4   Liver Function Tests:  Recent Labs Lab 05/28/16 0506  AST 37  ALT 20  ALKPHOS 85  BILITOT 3.8*  PROT 7.8  ALBUMIN 4.2   No results for input(s): LIPASE, AMYLASE in the last 168 hours. No results for input(s): AMMONIA in the last 168 hours. CBC:  Recent Labs Lab 05/28/16 0506 05/29/16 1010 05/30/16 2040 05/31/16 0520 06/02/16 0648  WBC 11.1* 10.7* 13.6* 13.8* 12.3*  NEUTROABS 7.7 8.1* 9.0* 9.4* 8.0*  HGB 7.4* 7.4* 7.6* 7.9* 7.0*  HCT 21.4* 21.8* 22.8* 24.4* 20.7*  MCV 87.0 88.6 88.7 90.4 89.6  PLT 553* 569* 621* 579* 594*   Cardiac Enzymes: No results for input(s): CKTOTAL, CKMB, CKMBINDEX, TROPONINI in the last 168 hours. BNP (last 3 results)  Recent  Labs  04/21/16 1139 05/05/16 1206 05/16/16 1724  BNP 321.8* 639.0* 299.8*    ProBNP (last 3 results) No results for input(s): PROBNP in the last 8760 hours.  CBG: No results for input(s): GLUCAP in the last 168 hours.  No results found for this or any previous visit (from the past 240 hour(s)).   Studies: Dg Chest 2 View  Result Date: 05/28/2016 CLINICAL DATA:  36 year old male with sickle cell anemia presenting with bilateral rib pain. EXAM: CHEST  2 VIEW COMPARISON:  Chest radiograph dated 05/14/2016 FINDINGS: Left pectoral subclavian approach infusion catheter with tip over the right atrial silhouette adjacent to the cavoatrial junction. There is stable cardiomegaly with mild vascular congestion. No new consolidative changes. There is no pleural effusion or pneumothorax. No acute osseous pathology. IMPRESSION: 1. No focal consolidation. 2. Cardiomegaly with probable mild vascular congestion. Electronically Signed   By: Anner Crete M.D.   On: 05/28/2016 04:44   Ct Angio Chest Pe W Or Wo Contrast  Result Date: 05/06/2016 CLINICAL DATA:  Hypoxia, history of sickle cell EXAM: CT ANGIOGRAPHY CHEST WITH CONTRAST TECHNIQUE: Multidetector CT imaging of the chest was performed using the standard protocol during bolus administration of intravenous contrast. Multiplanar CT image reconstructions and MIPs were obtained to evaluate the vascular anatomy. CONTRAST:  75 cc of Isovue 370 IV COMPARISON:  CXR 04/29/2016, chest CT 03/13/2016 FINDINGS: Cardiovascular: Cardiomegaly. Port catheter tip in the cavoatrial junction. Small pericardial effusion. No acute pulmonary embolus. To the third order branches. No aortic aneurysm or dissection. Mediastinum/Nodes: Mild retrosternal extension of the thyroid gland without focal mass. Trachea and esophagus demonstrate no acute findings. Multiple small prevascular, paratracheal and hilar nodes are again seen without pathologic appearing enlargement. Lungs/Pleura:  Perihilar distribution of ground-glass opacities suspicious for mild pulmonary edema or alveolitis secondary to infection or inflammation. No significant pleural effusion or pneumothorax. Upper Abdomen: No acute abnormality in the upper abdomen. 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 Musculoskeletal: Central endplate compressions of several mid thoracic vertebrae consistent with history of sickle cell. No acute osseous appearing abnormality. Review of the MIP images confirms the above findings. IMPRESSION: Stable cardiomegaly with mild perihilar ground-glass opacities suspicious for pulmonary edema with differential possibilities including alveolitis possibly from an infectious or inflammatory process. No acute pulmonary embolus. Auto infarcted spleen. H- shaped vertebrae in the mid thoracic spine consistent with history of sickle cell. Electronically Signed   By: Ashley Royalty M.D.   On: 05/06/2016 19:11   Dg Chest Port 1 View  Result Date: 05/14/2016 CLINICAL DATA:  Acute chest pain.  History of sickle cell anemia. EXAM: PORTABLE CHEST 1 VIEW COMPARISON:  04/29/2016 and prior exams FINDINGS: Cardiomegaly and mild vascular  congestion noted. A left Port-A-Cath is present with tip overlying the superior cavoatrial junction. There is no evidence of focal airspace disease, pulmonary edema, suspicious pulmonary nodule/mass, pleural effusion, or pneumothorax. No acute bony abnormalities are identified. Left humeral head AVN again noted. IMPRESSION: Cardiomegaly and mild pulmonary vascular congestion. Electronically Signed   By: Margarette Canada M.D.   On: 05/14/2016 10:23    Scheduled Meds: . carvedilol  3.125 mg Oral BID WC  . chlorhexidine  15 mL Mouth/Throat BID  . folic acid  2 mg Oral Daily  . gabapentin  300 mg Oral TID  . HYDROmorphone   Intravenous Q4H  . HYDROmorphone  4 mg Oral Q4H  . hydroxyurea  1,000 mg Oral QODAY  . hydroxyurea  500 mg Oral QODAY  .  ketorolac  30 mg Intravenous Q6H  . lisinopril  2.5 mg Oral Daily  . morphine  30 mg Oral Q12H  . rivaroxaban  20 mg Oral Q supper  . senna-docusate  1 tablet Oral BID   Continuous Infusions: . dextrose 5 % and 0.45% NaCl 10 mL/hr at 05/31/16 1700  . diphenhydrAMINE (BENADRYL) IVPB(SICKLE CELL ONLY)      Active Problems:   Sickle cell pain crisis (HCC)   Hx of pulmonary embolus   Sickle cell crisis (HCC)   Essential hypertension   Chronic pain syndrome   PAH (pulmonary artery hypertension) (HCC)   Prolonged Q-T interval on ECG   In excess of 25 minutes spent during this visit. Greater than 50% involved face to face contact with the patient for assessment, counseling and coordination of care.

## 2016-06-03 ENCOUNTER — Ambulatory Visit: Payer: Self-pay

## 2016-06-03 DIAGNOSIS — Z7901 Long term (current) use of anticoagulants: Secondary | ICD-10-CM

## 2016-06-03 MED ORDER — HYDROMORPHONE HCL 1 MG/ML IJ SOLN
1.0000 mg | Freq: Once | INTRAMUSCULAR | Status: AC
  Administered 2016-06-03: 1 mg via INTRAVENOUS

## 2016-06-03 NOTE — Progress Notes (Signed)
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 Hb SS with crisis: Continue PCA, and Toradol. Expect discharge home tomorrow.  2. Chronic Respiratory Failure with Hypoxia: I personally checked saturations at rest with the [place] and saturations were 95% on RA.  3. Anemia of Chronic Disease: Hb stable. At baseline.  4. Chronic Anticoagulation: Continue Xarelto   Code Status: Full Code Family Communication: N/A Disposition Plan: Anticipate discharge tomorrow.  Gerald Losh A.  Pager [phone]. If 7PM-7AM, please contact night-coverage.  06/03/2016, 2:06 PM  LOS: 4 days   Interim History: Pt reports that he feels as if ".Marland Kitchenas if a truck ran over me..." .    Consultants:  None  Procedures:  None  Antibiotics:  None    Objective: Vitals:   06/03/16 0903 06/03/16 0941 06/03/16 1159 06/03/16 1236  BP:  112/79    Pulse:  83    Resp: 15 17 16 14   Temp:  98.4 F (36.9 C)    TempSrc:  Oral    SpO2: 93% 94% 93% 94%  Weight:      Height:       Weight change: 3.1 kg (6 lb 13.4 oz)  Intake/Output Summary (Last 24 hours) at 06/03/16 1406 Last data filed at 06/03/16 1156  Gross per 24 hour  Intake          1318.59 ml  Output             1800 ml  Net          -481.41 ml     Physical Exam General: Alert, awake, oriented x3, in no acute distress.  HEENT: [place]/AT PEERL, EOMI, anicteric Neck: Trachea midline,  no masses, no thyromegal,y no JVD, no carotid bruit OROPHARYNX:  Moist, No exudate/ erythema/lesions.  Heart: Regular rate and rhythm, without murmurs, rubs, gallops, PMI non-displaced, no heaves or thrills on palpation.  Lungs: Clear  to auscultation, no wheezing or rhonchi noted. No increased vocal fremitus resonant to percussion  Abdomen: Soft, nontender, nondistended, positive bowel sounds, no masses no hepatosplenomegaly noted.  Neuro: No focal neurological deficits noted cranial nerves II through XII grossly intact.  Strength at bseline in bilateral upper and lower extremities. Musculoskeletal: No warmth swelling or erythema around joints, no spinal tenderness noted. Psychiatric: Patient alert and oriented x3, good insight and cognition, good recent to remote recall.    Data Reviewed: Basic Metabolic Panel:  Recent Labs Lab 05/28/16 0506 05/29/16 1010 05/30/16 2040 05/31/16 0520  NA 137 136 135 136  K 3.9 4.0 3.5 3.6  CL 107 108 107 105  CO2 23 23 22 22   GLUCOSE 105* 103* 118* 139*  BUN 25* 11 7 8   CREATININE 1.13 0.86 0.89 1.17  CALCIUM 8.9 9.0 9.0 9.0  MG  --   --   --  1.6*  PHOS  --   --   --  4.4   Liver Function Tests:  Recent Labs Lab 05/28/16 0506  AST 37  ALT 20  ALKPHOS 85  BILITOT 3.8*  PROT 7.8  ALBUMIN 4.2   No results for input(s): LIPASE, AMYLASE in the last 168 hours. No results for input(s): AMMONIA in  the last 168 hours. CBC:  Recent Labs Lab 05/28/16 0506 05/29/16 1010 05/30/16 2040 05/31/16 0520 06/02/16 0648  WBC 11.1* 10.7* 13.6* 13.8* 12.3*  NEUTROABS 7.7 8.1* 9.0* 9.4* 8.0*  HGB 7.4* 7.4* 7.6* 7.9* 7.0*  HCT 21.4* 21.8* 22.8* 24.4* 20.7*  MCV 87.0 88.6 88.7 90.4 89.6  PLT 553* 569* 621* 579* 594*   Cardiac Enzymes: No results for input(s): CKTOTAL, CKMB, CKMBINDEX, TROPONINI in the last 168 hours. BNP (last 3 results)  Recent Labs  04/21/16 1139 05/05/16 1206 05/16/16 1724  BNP 321.8* 639.0* 299.8*    ProBNP (last 3 results) No results for input(s): PROBNP in the last 8760 hours.  CBG: No results for input(s): GLUCAP in the last 168 hours.  No results found for this or any previous visit (from the past 240 hour(s)).   Studies: Dg Chest 2  View  Result Date: 05/28/2016 CLINICAL DATA:  36 year old male with sickle cell anemia presenting with bilateral rib pain. EXAM: CHEST  2 VIEW COMPARISON:  Chest radiograph dated 05/14/2016 FINDINGS: Left pectoral subclavian approach infusion catheter with tip over the right atrial silhouette adjacent to the cavoatrial junction. There is stable cardiomegaly with mild vascular congestion. No new consolidative changes. There is no pleural effusion or pneumothorax. No acute osseous pathology. IMPRESSION: 1. No focal consolidation. 2. Cardiomegaly with probable mild vascular congestion. Electronically Signed   By: Anner Crete M.D.   On: 05/28/2016 04:44   Ct Angio Chest Pe W Or Wo Contrast  Result Date: 05/06/2016 CLINICAL DATA:  Hypoxia, history of sickle cell EXAM: CT ANGIOGRAPHY CHEST WITH CONTRAST TECHNIQUE: Multidetector CT imaging of the chest was performed using the standard protocol during bolus administration of intravenous contrast. Multiplanar CT image reconstructions and MIPs were obtained to evaluate the vascular anatomy. CONTRAST:  75 cc of Isovue 370 IV COMPARISON:  CXR 04/29/2016, chest CT 03/13/2016 FINDINGS: Cardiovascular: Cardiomegaly. Port catheter tip in the cavoatrial junction. Small pericardial effusion. No acute pulmonary embolus. To the third order branches. No aortic aneurysm or dissection. Mediastinum/Nodes: Mild retrosternal extension of the thyroid gland without focal mass. Trachea and esophagus demonstrate no acute findings. Multiple small prevascular, paratracheal and hilar nodes are again seen without pathologic appearing enlargement. Lungs/Pleura: Perihilar distribution of ground-glass opacities suspicious for mild pulmonary edema or alveolitis secondary to infection or inflammation. No significant pleural effusion or pneumothorax. Upper Abdomen: No acute abnormality in the upper abdomen. 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  Musculoskeletal: Central endplate compressions of several mid thoracic vertebrae consistent with history of sickle cell. No acute osseous appearing abnormality. Review of the MIP images confirms the above findings. IMPRESSION: Stable cardiomegaly with mild perihilar ground-glass opacities suspicious for pulmonary edema with differential possibilities including alveolitis possibly from an infectious or inflammatory process. No acute pulmonary embolus. Auto infarcted spleen. H- shaped vertebrae in the mid thoracic spine consistent with history of sickle cell. Electronically Signed   By: Ashley Royalty M.D.   On: 05/06/2016 19:11   Dg Chest Port 1 View  Result Date: 05/14/2016 CLINICAL DATA:  Acute chest pain.  History of sickle cell anemia. EXAM: PORTABLE CHEST 1 VIEW COMPARISON:  04/29/2016 and prior exams FINDINGS: Cardiomegaly and mild vascular congestion noted. A left Port-A-Cath is present with tip overlying the superior cavoatrial junction. There is no evidence of focal airspace disease, pulmonary edema, suspicious pulmonary nodule/mass, pleural effusion, or pneumothorax. No acute bony abnormalities are identified. Left humeral head AVN again noted. IMPRESSION: Cardiomegaly and mild pulmonary vascular  congestion. Electronically Signed   By: Margarette Canada M.D.   On: 05/14/2016 10:23    Scheduled Meds: . carvedilol  3.125 mg Oral BID WC  . chlorhexidine  15 mL Mouth/Throat BID  . folic acid  2 mg Oral Daily  . gabapentin  300 mg Oral TID  . HYDROmorphone   Intravenous Q4H  .  HYDROmorphone (DILAUDID) injection  1 mg Intravenous Once  . HYDROmorphone  4 mg Oral Q4H  . hydroxyurea  1,000 mg Oral QODAY  . hydroxyurea  500 mg Oral QODAY  . ketorolac  30 mg Intravenous Q6H  . lisinopril  2.5 mg Oral Daily  . morphine  30 mg Oral Q12H  . rivaroxaban  20 mg Oral Q supper  . senna-docusate  1 tablet Oral BID   Continuous Infusions: . dextrose 5 % and 0.45% NaCl 10 mL/hr at 05/31/16 1700  .  diphenhydrAMINE (BENADRYL) IVPB(SICKLE CELL ONLY)      Active Problems:   Sickle cell pain crisis ([facility])   Hx of pulmonary embolus   Sickle cell crisis ([facility])   Essential hypertension   Chronic pain syndrome   PAH (pulmonary artery hypertension) ([facility])   Prolonged Q-T interval on ECG    In excess of 25 minutes spent during this visit. Greater than 50% involved face to face contact with the patient for assessment, counseling and coordination of care.

## 2016-06-03 NOTE — Progress Notes (Signed)
Patient on 3L 02 at rest had an 02 sat of 87 percent. Upon ambulation to nursing station, patient dropped to a low of 69 percent (on 3L O2).  Patient's HR at rest was 95.  Upon ambulation, HR did not change much (stayed around 91-96 bpm).Roderick Pee

## 2016-06-03 NOTE — Care Management Note (Signed)
Case Management Note  Patient Details  Name: [name] MRN: [id number] Date of Birth: 07/09/1980  Subjective/Objective:  36 yo admitted with Sickle Cell Crisis.                  Action/Plan: Pt from home and will DC back home. Pt currently uses [facility] for home 02 on 4L. CM will continue to follow and assist with DC needs.  Expected Discharge Date:   (unknown)               Expected Discharge Plan:  Home/Self Care  In-House Referral:     Discharge planning Services  CM Consult  Post Acute Care Choice:    Choice offered to:     DME Arranged:    DME Agency:     HH Arranged:    HH Agency:     Status of Service:  In process, will continue to follow  If discussed at Long Length of Stay Meetings, dates discussed:    Additional CommentsLynnell Catalan, RN 06/03/2016, 12:26 PM [phone]

## 2016-06-03 NOTE — Care Management Important Message (Signed)
Important Message  Patient Details  Name: [name] MRN: [id number] Date of Birth: [birth year]   Medicare Important Message Given:  Yes    Kerin Salen 06/03/2016, 10:21 AMImportant Message  Patient Details  Name: [name] MRN: [id number] Date of Birth: 18-Jun-1980   Medicare Important Message Given:  Yes    Kerin Salen 06/03/2016, 10:21 AM

## 2016-06-04 DIAGNOSIS — R9431 Abnormal electrocardiogram [ECG] [EKG]: Secondary | ICD-10-CM

## 2016-06-04 LAB — CBC WITH DIFFERENTIAL/PLATELET
Basophils Absolute: 0.1 10*3/uL (ref 0.0–0.1)
Basophils Relative: 1 %
EOS ABS: 0.7 10*3/uL (ref 0.0–0.7)
EOS PCT: 6 %
HCT: 18.3 % — ABNORMAL LOW (ref 39.0–52.0)
Hemoglobin: 6.2 g/dL — CL (ref 13.0–17.0)
LYMPHS ABS: 1.9 10*3/uL (ref 0.7–4.0)
LYMPHS PCT: 15 %
MCH: 30.2 pg (ref 26.0–34.0)
MCHC: 33.9 g/dL (ref 30.0–36.0)
MCV: 89.3 fL (ref 78.0–100.0)
MONO ABS: 1.7 10*3/uL — AB (ref 0.1–1.0)
Monocytes Relative: 15 %
Neutro Abs: 7.6 10*3/uL (ref 1.7–7.7)
Neutrophils Relative %: 63 %
PLATELETS: 491 10*3/uL — AB (ref 150–400)
RBC: 2.05 MIL/uL — ABNORMAL LOW (ref 4.22–5.81)
RDW: 19.6 % — AB (ref 11.5–15.5)
WBC: 12 10*3/uL — ABNORMAL HIGH (ref 4.0–10.5)

## 2016-06-04 LAB — PREPARE RBC (CROSSMATCH)

## 2016-06-04 LAB — RETICULOCYTES
RBC.: 2.05 MIL/uL — ABNORMAL LOW (ref 4.22–5.81)
RETIC CT PCT: 6.8 % — AB (ref 0.4–3.1)
Retic Count, Absolute: 139.4 10*3/uL (ref 19.0–186.0)

## 2016-06-04 MED ORDER — SODIUM CHLORIDE 0.9 % IV SOLN: Freq: Once | INTRAVENOUS | Status: DC

## 2016-06-04 NOTE — Progress Notes (Signed)
Patient ID: [name], male   DOB: [birth year], 36 y.o.   MRN: [id number] Subjective:  Patient claims he feels much better today. No fever, no chest pain. Hb dropped to 6.2 from 7.0. He has no symptom  Objective:  Vital signs in last 24 hours:  Vitals:   06/04/16 0455 06/04/16 0533 06/04/16 0809 06/04/16 0932  BP:  110/73  115/83  Pulse:  79  70  Resp: 16 14 13 12   Temp:  98.3 F (36.8 C)  99 F (37.2 C)  TempSrc:  Oral  Oral  SpO2: 92% 99% 92% 94%  Weight:      Height:       Intake/Output from previous day:   Intake/Output Summary (Last 24 hours) at 06/04/16 0948 Last data filed at 06/04/16 0542  Gross per 24 hour  Intake             1390 ml  Output             1450 ml  Net              -60 ml   Physical Exam: General: Alert, awake, oriented x3, in no acute distress.  HEENT: [place]/AT PEERL, EOMI Neck: Trachea midline,  no masses, no thyromegal,y no JVD, no carotid bruit OROPHARYNX:  Moist, No exudate/ erythema/lesions.  Heart: Regular rate and rhythm, without murmurs, rubs, gallops, PMI non-displaced, no heaves or thrills on palpation.  Lungs: Clear to auscultation, no wheezing or rhonchi noted. No increased vocal fremitus resonant to percussion  Abdomen: Soft, nontender, nondistended, positive bowel sounds, no masses no hepatosplenomegaly noted..  Neuro: No focal neurological deficits noted cranial nerves II through XII grossly intact. DTRs 2+ bilaterally upper and lower extremities. Strength 5 out of 5 in bilateral upper and lower extremities. Musculoskeletal: No warm swelling or erythema around joints, no spinal tenderness noted. Psychiatric: Patient alert and oriented x3, good insight and cognition, good recent to remote recall. Lymph node survey: No cervical axillary or inguinal lymphadenopathy noted.  Lab Results:  Basic Metabolic Panel:    Component Value Date/Time   NA 136 05/31/2016 0520   K 3.6 05/31/2016 0520   CL 105 05/31/2016 0520   CO2 22  05/31/2016 0520   BUN 8 05/31/2016 0520   CREATININE 1.17 05/31/2016 0520   CREATININE 0.64 08/22/2013 1235   GLUCOSE 139 (H) 05/31/2016 0520   CALCIUM 9.0 05/31/2016 0520   CBC:    Component Value Date/Time   WBC 12.0 (H) 06/04/2016 0520   HGB 6.2 (LL) 06/04/2016 0520   HCT 18.3 (L) 06/04/2016 0520   PLT 491 (H) 06/04/2016 0520   MCV 89.3 06/04/2016 0520   NEUTROABS 7.6 06/04/2016 0520   LYMPHSABS 1.9 06/04/2016 0520   MONOABS 1.7 (H) 06/04/2016 0520   EOSABS 0.7 06/04/2016 0520   BASOSABS 0.1 06/04/2016 0520    No results found for this or any previous visit (from the past 240 hour(s)).  Studies/Results: No results found.  Medications: Scheduled Meds: . carvedilol  3.125 mg Oral BID WC  . chlorhexidine  15 mL Mouth/Throat BID  . folic acid  2 mg Oral Daily  . gabapentin  300 mg Oral TID  . HYDROmorphone   Intravenous Q4H  . HYDROmorphone  4 mg Oral Q4H  . hydroxyurea  1,000 mg Oral QODAY  . hydroxyurea  500 mg Oral QODAY  . ketorolac  30 mg Intravenous Q6H  . lisinopril  2.5 mg Oral Daily  . morphine  30 mg  Oral Q12H  . rivaroxaban  20 mg Oral Q supper  . senna-docusate  1 tablet Oral BID   Continuous Infusions: . sodium chloride    . dextrose 5 % and 0.45% NaCl 10 mL/hr at 05/31/16 1700  . 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 Hb SS with crisis: Continue PCA, and Toradol.  2. Chronic Respiratory  Failure with Hypoxia: Saturations were 95% on RA. Patient stable 3. Anemia of Chronic Disease: Hb dropped to 6.2 today, baseline is between 6.5 and 7. Will transfuse with 1 unit of PRBC. Anticipate discharge home tomorrow  4. Chronic Anticoagulation: Continue Xarelto  Code Status: Full Code Family Communication: N/A Disposition Plan: Not yet ready for discharge  [name]  If 7PM-7AM, please contact night-coverage.  06/04/2016, 9:48 AM  LOS: 5 days

## 2016-06-05 DIAGNOSIS — I272 Pulmonary hypertension, unspecified: Secondary | ICD-10-CM

## 2016-06-05 LAB — CBC WITH DIFFERENTIAL/PLATELET
Basophils Absolute: 0.1 10*3/uL (ref 0.0–0.1)
Basophils Relative: 1 %
EOS PCT: 6 %
Eosinophils Absolute: 0.6 10*3/uL (ref 0.0–0.7)
HEMATOCRIT: 21.7 % — AB (ref 39.0–52.0)
Hemoglobin: 7 g/dL — ABNORMAL LOW (ref 13.0–17.0)
LYMPHS ABS: 2.2 10*3/uL (ref 0.7–4.0)
LYMPHS PCT: 20 %
MCH: 29.8 pg (ref 26.0–34.0)
MCHC: 32.3 g/dL (ref 30.0–36.0)
MCV: 92.3 fL (ref 78.0–100.0)
MONO ABS: 1.6 10*3/uL — AB (ref 0.1–1.0)
MONOS PCT: 15 %
NEUTROS ABS: 6.2 10*3/uL (ref 1.7–7.7)
Neutrophils Relative %: 58 %
PLATELETS: 518 10*3/uL — AB (ref 150–400)
RBC: 2.35 MIL/uL — ABNORMAL LOW (ref 4.22–5.81)
RDW: 20.4 % — AB (ref 11.5–15.5)
WBC: 10.7 10*3/uL — ABNORMAL HIGH (ref 4.0–10.5)

## 2016-06-05 MED ORDER — HEPARIN SOD (PORK) LOCK FLUSH 100 UNIT/ML IV SOLN
500.0000 [IU] | Freq: Once | INTRAVENOUS | Status: AC
  Administered 2016-06-05: 500 [IU] via INTRAVENOUS
  Filled 2016-06-05: qty 5

## 2016-06-05 NOTE — Progress Notes (Signed)
Patient discharged to home, discharge instructions reviewed with patient who verbalized understanding. New RX's given to patient. 

## 2016-06-05 NOTE — Discharge Summary (Signed)
Physician Discharge Summary  [name] KNL:[id number] DOB: 1980-05-12 DOA: 05/30/2016  PCP: Gerald Garter, MD  Admit date: 05/30/2016  Discharge date: 06/05/2016  Discharge Diagnoses:  Active Problems:   Sickle cell pain crisis ([place])   Hx of pulmonary embolus   Sickle cell crisis (H[facility])   Essential hypertension   Chronic pain syndrome   PAH (pulmonary artery hypertension) (H[facility])   Prolonged Q-T interval on ECG   Discharge Condition: Stable  Disposition:  Follow-up Information    Gerald Sniff, NP Follow up on 06/13/2016.   Specialty:  Adult Health Nurse Practitioner Contact information: [place] [address] [phone]        Gerald Reining, MD Follow up on 06/28/2016.   Specialty:  Internal Medicine Why:  Pulmonary Services Contact information: [facility] [id number] [place] [address] [phone]        Gerald Garter, MD. Call.   Specialty:  Internal Medicine Contact information: [place] [address] [phone]          Diet: Regular  Wt Readings from Last 3 Encounters:  06/05/16 92.1 kg (203 lb 0.7 oz)  05/28/16 90.7 kg (200 lb)  05/16/16 86.3 kg (190 lb 4.1 oz)   History of present illness:  [name] is a 36 y.o. male with medical history significant ofsickle cell disease, acute chest, pulmonary embolism on xarelto, pulmonary hypertension, stroke, Chronic respiratory failure with hypoxia on home oxygen at 3-4L    Presented with 2 days of pain bilateral as well as leg/ankle pain bilaterally on medications not effective. Symptoms typical of his sickle cell crisis denies chest pain or shortness of breath no fevers or chills no nausea vomiting. Reports he run out of his MS contin 2 days ago and his next refill will be in 3 days, He has been trying to control his pain at home with dilaudid. Pain NOt similar to prior PE. Reports compliance with anticoagulation.  2 days  ago presented to ER with rib pain typical of sickle cell crisis  Labs wereat baseline.CXR negative.EKG unchanged. Pt felt better with sickle cell protocol medications and was d/c'ed to home. Came back next day for the same as well as ankle pain  As per ER note No sign of acute aplastic crisis or chest syndrome. Symptoms have improved with treatment in the ED and patient felt ready for discharge. His pain has recurred and he presented for the third time today  still denies any fevers or chills  Regarding pertinent Chronic problems: Last admission was in mid April, that time he was noted to have acute on chronic respiratory failure requiring nonrebreather of note patient at baseline has some chronic respiratory failure chronic oxygen at nighttime secondary to mild COPD.he was found to have pulmonary edema and was diuresed he has known history of pulmonary hypertension for which she has been taking Letairis. 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  IN ER:  Temp (24hrs), Avg:98.2 F (36.8 C), Min:98.2 F (36.8 C), Max:98.2 F (36.8 C)      RR 18 97% RA HR 66 BP 100/74 Hg for the past 2 days 7.4 which is close to baseline Following Medications were ordered in ER: Medications  dextrose 5 %-0.45 % sodium chloride infusion (  Intravenous New Bag/Given 05/30/16 1740)  HYDROmorphone (DILAUDID) injection 2 mg (not administered)    Or  HYDROmorphone (DILAUDID) injection 2 mg (not administered)  ketorolac (TORADOL) 30 MG/ML injection 30 mg (30 mg Intravenous Given 05/30/16 1758)  HYDROmorphone (DILAUDID) injection 2 mg (2 mg Intravenous Given 05/30/16 1741)    Or  HYDROmorphone (DILAUDID) injection 2 mg ( Subcutaneous See Alternative 05/30/16 1741)  HYDROmorphone (DILAUDID) injection 2 mg (2 mg Intravenous Given 05/30/16 1821)    Or   HYDROmorphone (DILAUDID) injection 2 mg ( Subcutaneous See Alternative 05/30/16 1821)  diphenhydrAMINE (BENADRYL) capsule 50 mg (50 mg Oral Given 05/30/16 1759)    Hospitalist was called for admission for Sickle cell crisis  Hospital Course:  Patient was admitted and managed for Hb SS with crisis. He was managed with the standard sickle cell pain management protocol. His chronic respiratory failure remained at baseline, his Hb dropped to 6.2, he has a baseline Hb of 6.5-7.0 g/dL, he was transfused with 1 unit of PRBC, Hb improved to 7.0, pain returned to baseline, vitals returned to baseline and patient was clinically stable. He is chronically on n Hydrea and Endari for chronic management of Sickle Cell Disease. Both of these medications were continued during hospitalization.  He was discharged home in a hemodynamically stable condition, to follow up with PCP within one week of this discharge. Continue home medications and patient encouraged to use his home oxygen all the time.   Discharge Exam: Vitals:   06/05/16 0958 06/05/16 1230  BP: 112/72   Pulse: 72   Resp: 14 14  Temp: 98.4 F (36.9 C)    Vitals:   06/05/16 0550 06/05/16 0733 06/05/16 0958 06/05/16 1230  BP: 105/76  112/72   Pulse: 73  72   Resp: 16 15 14 14   Temp: 98.3 F (36.8 C)  98.4 F (36.9 C)   TempSrc: Oral  Oral   SpO2: 90% 93% 94% 94%  Weight: 92.1 kg (203 lb 0.7 oz)     Height:        General appearance : Awake, alert, not in any distress. Speech Clear. Not toxic looking HEENT: Atraumatic and Normocephalic, pupils equally reactive to light and accomodation Neck: Supple, no JVD. No cervical lymphadenopathy.  Chest: Good air entry bilaterally, no added sounds  CVS: S1 S2 regular, no murmurs.  Abdomen: Bowel sounds present, Non tender and not distended with no gaurding, rigidity or rebound. Extremities: B/L Lower Ext shows no edema, both legs are warm to touch Neurology: Awake alert, and oriented X 3, CN II-XII  intact, Non focal Skin: No Rash  Discharge Instructions  Discharge Instructions    AMB Referral to [place] [facility]    Complete by:  As directed    Please assign to [place] for care coordination and transition of care. Written consent obtained. Please see notes and call with questions. Currently at [facility]. Marthenia Rolling, MSN-Ed, RN,BSN [facility] [phone]   Reason for consult:  Please assign to [place]   Expected date of contact:  1-3 days (reserved for hospital discharges)   Diet - low sodium heart healthy    Complete by:  As directed    For home use only DME oxygen    Complete by:  As directed    Mode or (Route):  Nasal cannula   Frequency:  Continuous (stationary and portable oxygen unit needed)   Oxygen conserving device:  Yes   Oxygen delivery system:  Gas  Increase activity slowly    Complete by:  As directed      Allergies as of 06/05/2016   No Known Allergies     Medication List    TAKE these medications   carvedilol 3.125 MG tablet Commonly known as:  COREG Take 1 tablet (3.125 mg total) by mouth 2 (two) times daily with a meal.   cholecalciferol 1000 units tablet Commonly known as:  VITAMIN D Take 2,000 Units by mouth daily.   folic acid 1 MG tablet Commonly known as:  FOLVITE Take 2 tablets (2 mg total) by mouth daily.   gabapentin 300 MG capsule Commonly known as:  NEURONTIN Take 1 capsule (300 mg total) by mouth 3 (three) times daily.   HYDROmorphone 4 MG tablet Commonly known as:  DILAUDID Take by mouth every 4 (four) hours as needed for severe pain. What changed:  Another medication with the same name was added. Make sure you understand how and when to take each.   HYDROmorphone 4 MG tablet Commonly known as:  DILAUDID Take 2 tablets (8 mg total) by mouth every 4 (four) hours as needed for moderate pain or severe pain. What changed:  You were already taking a medication with the same name,  and this prescription was added. Make sure you understand how and when to take each.   hydroxyurea 500 MG capsule Commonly known as:  HYDREA Take 1000 mg QOD and alternate with 500 mg QOD. May take with food to minimize GI side effects. What changed:  how much to take  how to take this  when to take this  additional instructions   L-glutamine 5 g Pack Powder Packet Commonly known as:  ENDARI Take 15 g by mouth 2 (two) times daily.   lisinopril 2.5 MG tablet Commonly known as:  PRINIVIL,ZESTRIL Take 2.5 mg by mouth daily.   morphine 30 MG 12 hr tablet Commonly known as:  MS CONTIN Take 1 tablet (30 mg total) by mouth every 12 (twelve) hours.   potassium chloride SA 20 MEQ tablet Commonly known as:  K-DUR,KLOR-CON Take 2 tablets (40 mEq total) by mouth daily.   rivaroxaban 20 MG Tabs tablet Commonly known as:  XARELTO Take 1 tablet (20 mg total) by mouth at bedtime.   torsemide 20 MG tablet Commonly known as:  DEMADEX Take 1 tablet (20 mg total) by mouth daily.   zolpidem 10 MG tablet Commonly known as:  AMBIEN Take 1 tablet (10 mg total) by mouth at bedtime as needed for sleep.            Durable Medical Equipment        Start     Ordered   06/02/16 0000  For home use only DME oxygen    Question Answer Comment  Mode or (Route) Nasal cannula   Frequency Continuous (stationary and portable oxygen unit needed)   Oxygen conserving device Yes   Oxygen delivery system Gas      06/02/16 1423       The results of significant diagnostics from this hospitalization (including imaging, microbiology, ancillary and laboratory) are listed below for reference.    Significant Diagnostic Studies: Dg Chest 2 View  Result Date: 05/28/2016 CLINICAL DATA:  37 year old male with sickle cell anemia presenting with bilateral rib pain. EXAM: CHEST  2 VIEW COMPARISON:  Chest radiograph dated 05/14/2016 FINDINGS: Left pectoral subclavian approach infusion catheter with tip  over the right atrial silhouette adjacent to the cavoatrial junction. There is stable cardiomegaly  with mild vascular congestion. No new consolidative changes. There is no pleural effusion or pneumothorax. No acute osseous pathology. IMPRESSION: 1. No focal consolidation. 2. Cardiomegaly with probable mild vascular congestion. Electronically Signed   By: Anner Crete M.D.   On: 05/28/2016 04:44   Ct Angio Chest Pe W Or Wo Contrast  Result Date: 05/06/2016 CLINICAL DATA:  Hypoxia, history of sickle cell EXAM: CT ANGIOGRAPHY CHEST WITH CONTRAST TECHNIQUE: Multidetector CT imaging of the chest was performed using the standard protocol during bolus administration of intravenous contrast. Multiplanar CT image reconstructions and MIPs were obtained to evaluate the vascular anatomy. CONTRAST:  75 cc of Isovue 370 IV COMPARISON:  CXR 04/29/2016, chest CT 03/13/2016 FINDINGS: Cardiovascular: Cardiomegaly. Port catheter tip in the cavoatrial junction. Small pericardial effusion. No acute pulmonary embolus. To the third order branches. No aortic aneurysm or dissection. Mediastinum/Nodes: Mild retrosternal extension of the thyroid gland without focal mass. Trachea and esophagus demonstrate no acute findings. Multiple small prevascular, paratracheal and hilar nodes are again seen without pathologic appearing enlargement. Lungs/Pleura: Perihilar distribution of ground-glass opacities suspicious for mild pulmonary edema or alveolitis secondary to infection or inflammation. No significant pleural effusion or pneumothorax. Upper Abdomen: No acute abnormality in the upper abdomen. 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 Musculoskeletal: Central endplate compressions of several mid thoracic vertebrae consistent with history of sickle cell. No acute osseous appearing abnormality. Review of the MIP images confirms the above findings. IMPRESSION: Stable cardiomegaly with mild perihilar  ground-glass opacities suspicious for pulmonary edema with differential possibilities including alveolitis possibly from an infectious or inflammatory process. No acute pulmonary embolus. Auto infarcted spleen. H- shaped vertebrae in the mid thoracic spine consistent with history of sickle cell. Electronically Signed   By: Ashley Royalty M.D.   On: 05/06/2016 19:11   Dg Chest Port 1 View  Result Date: 05/14/2016 CLINICAL DATA:  Acute chest pain.  History of sickle cell anemia. EXAM: PORTABLE CHEST 1 VIEW COMPARISON:  04/29/2016 and prior exams FINDINGS: Cardiomegaly and mild vascular congestion noted. A left Port-A-Cath is present with tip overlying the superior cavoatrial junction. There is no evidence of focal airspace disease, pulmonary edema, suspicious pulmonary nodule/mass, pleural effusion, or pneumothorax. No acute bony abnormalities are identified. Left humeral head AVN again noted. IMPRESSION: Cardiomegaly and mild pulmonary vascular congestion. Electronically Signed   By: Margarette Canada M.D.   On: 05/14/2016 10:23    Microbiology: No results found for this or any previous visit (from the past 240 hour(s)).   Labs: Basic Metabolic Panel:  Recent Labs Lab 05/30/16 2040 05/31/16 0520  NA 135 136  K 3.5 3.6  CL 107 105  CO2 22 22  GLUCOSE 118* 139*  BUN 7 8  CREATININE 0.89 1.17  CALCIUM 9.0 9.0  MG  --  1.6*  PHOS  --  4.4   Liver Function Tests: No results for input(s): AST, ALT, ALKPHOS, BILITOT, PROT, ALBUMIN in the last 168 hours. No results for input(s): LIPASE, AMYLASE in the last 168 hours. No results for input(s): AMMONIA in the last 168 hours. CBC:  Recent Labs Lab 05/30/16 2040 05/31/16 0520 06/02/16 0648 06/04/16 0520 06/05/16 0500  WBC 13.6* 13.8* 12.3* 12.0* 10.7*  NEUTROABS 9.0* 9.4* 8.0* 7.6 6.2  HGB 7.6* 7.9* 7.0* 6.2* 7.0*  HCT 22.8* 24.4* 20.7* 18.3* 21.7*  MCV 88.7 90.4 89.6 89.3 92.3  PLT 621* 579* 594* 491* 518*   Cardiac Enzymes: No results for  input(s): CKTOTAL, CKMB, CKMBINDEX,  TROPONINI in the last 168 hours. BNP: Invalid input(s): POCBNP CBG: No results for input(s): GLUCAP in the last 168 hours.  Time coordinating discharge: 50 minutes  Signed:  [place]ola Depaz, [place] [facility] 06/05/2016, 12:31 PM

## 2016-06-06 ENCOUNTER — Emergency Department (HOSPITAL_COMMUNITY): Payer: Medicare Other

## 2016-06-06 ENCOUNTER — Other Ambulatory Visit: Payer: Self-pay

## 2016-06-06 ENCOUNTER — Inpatient Hospital Stay (HOSPITAL_COMMUNITY)
Admission: EM | Admit: 2016-06-06 | Discharge: 2016-06-13 | DRG: 812 | Disposition: A | Discharge status: Home / Self Care | Payer: Medicare Other | Attending: Internal Medicine | Admitting: Internal Medicine

## 2016-06-06 ENCOUNTER — Encounter (HOSPITAL_COMMUNITY): Payer: Self-pay

## 2016-06-06 DIAGNOSIS — R0602 Shortness of breath: Secondary | ICD-10-CM | POA: Diagnosis not present

## 2016-06-06 DIAGNOSIS — Z8673 Personal history of transient ischemic attack (TIA), and cerebral infarction without residual deficits: Secondary | ICD-10-CM | POA: Diagnosis not present

## 2016-06-06 DIAGNOSIS — Z87891 Personal history of nicotine dependence: Secondary | ICD-10-CM

## 2016-06-06 DIAGNOSIS — M62838 Other muscle spasm: Secondary | ICD-10-CM | POA: Diagnosis present

## 2016-06-06 DIAGNOSIS — D5700 Hb-SS disease with crisis, unspecified: Secondary | ICD-10-CM | POA: Diagnosis not present

## 2016-06-06 DIAGNOSIS — G894 Chronic pain syndrome: Secondary | ICD-10-CM | POA: Diagnosis not present

## 2016-06-06 DIAGNOSIS — D72829 Elevated white blood cell count, unspecified: Secondary | ICD-10-CM | POA: Diagnosis present

## 2016-06-06 DIAGNOSIS — D638 Anemia in other chronic diseases classified elsewhere: Secondary | ICD-10-CM | POA: Diagnosis present

## 2016-06-06 DIAGNOSIS — Z833 Family history of diabetes mellitus: Secondary | ICD-10-CM | POA: Diagnosis not present

## 2016-06-06 DIAGNOSIS — R109 Unspecified abdominal pain: Secondary | ICD-10-CM | POA: Diagnosis not present

## 2016-06-06 DIAGNOSIS — Z9114 Patient's other noncompliance with medication regimen: Secondary | ICD-10-CM | POA: Diagnosis not present

## 2016-06-06 DIAGNOSIS — I10 Essential (primary) hypertension: Secondary | ICD-10-CM | POA: Diagnosis present

## 2016-06-06 DIAGNOSIS — N179 Acute kidney failure, unspecified: Secondary | ICD-10-CM | POA: Diagnosis not present

## 2016-06-06 DIAGNOSIS — Z818 Family history of other mental and behavioral disorders: Secondary | ICD-10-CM | POA: Diagnosis not present

## 2016-06-06 DIAGNOSIS — Z86711 Personal history of pulmonary embolism: Secondary | ICD-10-CM

## 2016-06-06 DIAGNOSIS — J9611 Chronic respiratory failure with hypoxia: Secondary | ICD-10-CM | POA: Diagnosis present

## 2016-06-06 DIAGNOSIS — Z86718 Personal history of other venous thrombosis and embolism: Secondary | ICD-10-CM

## 2016-06-06 DIAGNOSIS — I272 Pulmonary hypertension, unspecified: Secondary | ICD-10-CM | POA: Diagnosis present

## 2016-06-06 DIAGNOSIS — Z7901 Long term (current) use of anticoagulants: Secondary | ICD-10-CM

## 2016-06-06 DIAGNOSIS — Z9981 Dependence on supplemental oxygen: Secondary | ICD-10-CM | POA: Diagnosis not present

## 2016-06-06 LAB — RETICULOCYTES
RBC.: 2.76 MIL/uL — ABNORMAL LOW (ref 4.22–5.81)
RETIC CT PCT: 10.9 % — AB (ref 0.4–3.1)
Retic Count, Absolute: 300.8 10*3/uL — ABNORMAL HIGH (ref 19.0–186.0)

## 2016-06-06 LAB — COMPREHENSIVE METABOLIC PANEL
ALK PHOS: 125 U/L (ref 38–126)
ALT: 44 U/L (ref 17–63)
ANION GAP: 7 (ref 5–15)
AST: 56 U/L — ABNORMAL HIGH (ref 15–41)
Albumin: 4.2 g/dL (ref 3.5–5.0)
BUN: 6 mg/dL (ref 6–20)
CO2: 21 mmol/L — ABNORMAL LOW (ref 22–32)
Calcium: 9.1 mg/dL (ref 8.9–10.3)
Chloride: 108 mmol/L (ref 101–111)
Creatinine, Ser: 0.68 mg/dL (ref 0.61–1.24)
Glucose, Bld: 106 mg/dL — ABNORMAL HIGH (ref 65–99)
Potassium: 4 mmol/L (ref 3.5–5.1)
Sodium: 136 mmol/L (ref 135–145)
Total Bilirubin: 4.7 mg/dL — ABNORMAL HIGH (ref 0.3–1.2)
Total Protein: 7.5 g/dL (ref 6.5–8.1)

## 2016-06-06 LAB — CBC WITH DIFFERENTIAL/PLATELET
BASOS ABS: 0.1 10*3/uL (ref 0.0–0.1)
Basophils Relative: 1 %
EOS ABS: 0.1 10*3/uL (ref 0.0–0.7)
EOS PCT: 1 %
HCT: 24.3 % — ABNORMAL LOW (ref 39.0–52.0)
Hemoglobin: 7.9 g/dL — ABNORMAL LOW (ref 13.0–17.0)
LYMPHS PCT: 11 %
Lymphs Abs: 1.4 10*3/uL (ref 0.7–4.0)
MCH: 28.6 pg (ref 26.0–34.0)
MCHC: 32.5 g/dL (ref 30.0–36.0)
MCV: 88 fL (ref 78.0–100.0)
MONO ABS: 1.6 10*3/uL — AB (ref 0.1–1.0)
Monocytes Relative: 13 %
Neutro Abs: 9.1 10*3/uL — ABNORMAL HIGH (ref 1.7–7.7)
Neutrophils Relative %: 74 %
PLATELETS: 549 10*3/uL — AB (ref 150–400)
RBC: 2.76 MIL/uL — ABNORMAL LOW (ref 4.22–5.81)
RDW: 20.8 % — AB (ref 11.5–15.5)
WBC: 12.2 10*3/uL — ABNORMAL HIGH (ref 4.0–10.5)

## 2016-06-06 LAB — RAPID URINE DRUG SCREEN, HOSP PERFORMED
Amphetamines: NOT DETECTED
BENZODIAZEPINES: NOT DETECTED
Barbiturates: NOT DETECTED
COCAINE: NOT DETECTED
OPIATES: POSITIVE — AB
Tetrahydrocannabinol: NOT DETECTED

## 2016-06-06 LAB — TYPE AND SCREEN
ABO/RH(D): O POS
Antibody Screen: NEGATIVE
UNIT DIVISION: 0

## 2016-06-06 LAB — BPAM RBC
Blood Product Expiration Date: 201805252359
ISSUE DATE / TIME: 201805051704
UNIT TYPE AND RH: 5100

## 2016-06-06 LAB — LIPASE, BLOOD: Lipase: 21 U/L (ref 11–51)

## 2016-06-06 MED ORDER — ONDANSETRON HCL 4 MG/2ML IJ SOLN: 4.0000 mg | Freq: Four times a day (QID) | INTRAMUSCULAR | Status: DC | PRN

## 2016-06-06 MED ORDER — CARVEDILOL 3.125 MG PO TABS
3.1250 mg | ORAL_TABLET | Freq: Two times a day (BID) | ORAL | Status: DC
  Administered 2016-06-07 – 2016-06-13 (×13): 3.125 mg via ORAL
  Filled 2016-06-06 (×13): qty 1

## 2016-06-06 MED ORDER — SODIUM CHLORIDE 0.9% FLUSH: 9.0000 mL | INTRAVENOUS | Status: DC | PRN

## 2016-06-06 MED ORDER — HYDROMORPHONE HCL 1 MG/ML IJ SOLN: 2.0000 mg | INTRAMUSCULAR | Status: AC

## 2016-06-06 MED ORDER — HYDROMORPHONE HCL 1 MG/ML IJ SOLN
2.0000 mg | INTRAMUSCULAR | Status: AC
  Administered 2016-06-06: 2 mg via INTRAVENOUS
  Filled 2016-06-06: qty 2

## 2016-06-06 MED ORDER — FOLIC ACID 1 MG PO TABS
2.0000 mg | ORAL_TABLET | Freq: Every day | ORAL | Status: DC
  Administered 2016-06-07 – 2016-06-13 (×7): 2 mg via ORAL
  Filled 2016-06-06 (×7): qty 2

## 2016-06-06 MED ORDER — GABAPENTIN 300 MG PO CAPS
300.0000 mg | ORAL_CAPSULE | Freq: Three times a day (TID) | ORAL | Status: DC
  Administered 2016-06-07 – 2016-06-13 (×21): 300 mg via ORAL
  Filled 2016-06-06 (×21): qty 1

## 2016-06-06 MED ORDER — POTASSIUM CHLORIDE CRYS ER 20 MEQ PO TBCR
40.0000 meq | EXTENDED_RELEASE_TABLET | Freq: Every day | ORAL | Status: DC
  Administered 2016-06-07 – 2016-06-13 (×7): 40 meq via ORAL
  Filled 2016-06-06 (×8): qty 2

## 2016-06-06 MED ORDER — HYDROXYUREA 500 MG PO CAPS
1000.0000 mg | ORAL_CAPSULE | ORAL | Status: DC
  Administered 2016-06-07 – 2016-06-13 (×4): 1000 mg via ORAL
  Filled 2016-06-06 (×6): qty 2

## 2016-06-06 MED ORDER — L-GLUTAMINE ORAL POWDER
15.0000 g | PACK | Freq: Two times a day (BID) | ORAL | Status: DC
  Administered 2016-06-07 – 2016-06-13 (×14): 15 g via ORAL
  Filled 2016-06-06 (×14): qty 3

## 2016-06-06 MED ORDER — KETOROLAC TROMETHAMINE 30 MG/ML IJ SOLN
30.0000 mg | INTRAMUSCULAR | Status: AC
  Administered 2016-06-06: 30 mg via INTRAVENOUS
  Filled 2016-06-06: qty 1

## 2016-06-06 MED ORDER — RIVAROXABAN 20 MG PO TABS
20.0000 mg | ORAL_TABLET | Freq: Every day | ORAL | Status: DC
  Administered 2016-06-07: 20 mg via ORAL
  Filled 2016-06-06: qty 1

## 2016-06-06 MED ORDER — DIPHENHYDRAMINE HCL 25 MG PO CAPS
25.0000 mg | ORAL_CAPSULE | Freq: Once | ORAL | Status: AC
  Administered 2016-06-06: 25 mg via ORAL
  Filled 2016-06-06: qty 1

## 2016-06-06 MED ORDER — HYDROMORPHONE HCL 1 MG/ML IJ SOLN: 2.0000 mg | INTRAMUSCULAR | Status: DC

## 2016-06-06 MED ORDER — DEXTROSE-NACL 5-0.45 % IV SOLN
INTRAVENOUS | Status: DC
  Administered 2016-06-07 (×2): via INTRAVENOUS

## 2016-06-06 MED ORDER — NALOXONE HCL 0.4 MG/ML IJ SOLN: 0.4000 mg | INTRAMUSCULAR | Status: DC | PRN

## 2016-06-06 MED ORDER — DIPHENHYDRAMINE HCL 50 MG/ML IJ SOLN
25.0000 mg | Freq: Once | INTRAMUSCULAR | Status: AC
  Administered 2016-06-06: 25 mg via INTRAVENOUS
  Filled 2016-06-06: qty 1

## 2016-06-06 MED ORDER — TORSEMIDE 20 MG PO TABS
20.0000 mg | ORAL_TABLET | Freq: Every day | ORAL | Status: DC
  Administered 2016-06-07 – 2016-06-08 (×2): 20 mg via ORAL
  Filled 2016-06-06 (×2): qty 1

## 2016-06-06 MED ORDER — MORPHINE SULFATE ER 30 MG PO TBCR
30.0000 mg | EXTENDED_RELEASE_TABLET | Freq: Two times a day (BID) | ORAL | Status: DC
  Administered 2016-06-07 – 2016-06-13 (×14): 30 mg via ORAL
  Filled 2016-06-06 (×15): qty 1

## 2016-06-06 MED ORDER — KCL IN DEXTROSE-NACL 20-5-0.45 MEQ/L-%-% IV SOLN
Freq: Once | INTRAVENOUS | Status: AC
  Administered 2016-06-06: 18:00:00 via INTRAVENOUS
  Filled 2016-06-06: qty 1000

## 2016-06-06 MED ORDER — SENNOSIDES-DOCUSATE SODIUM 8.6-50 MG PO TABS
1.0000 | ORAL_TABLET | Freq: Two times a day (BID) | ORAL | Status: DC
  Administered 2016-06-07 – 2016-06-13 (×13): 1 via ORAL
  Filled 2016-06-06 (×13): qty 1

## 2016-06-06 MED ORDER — HYDROMORPHONE 1 MG/ML IV SOLN
INTRAVENOUS | Status: DC
  Administered 2016-06-07: 7.8 mg via INTRAVENOUS
  Administered 2016-06-07: 09:00:00 via INTRAVENOUS
  Administered 2016-06-07: 7.2 mg via INTRAVENOUS
  Administered 2016-06-07: via INTRAVENOUS
  Administered 2016-06-07: 5.6 mg via INTRAVENOUS
  Administered 2016-06-07: 14.4 mg via INTRAVENOUS
  Filled 2016-06-06 (×2): qty 25

## 2016-06-06 MED ORDER — LISINOPRIL 5 MG PO TABS
2.5000 mg | ORAL_TABLET | Freq: Every day | ORAL | Status: DC
  Administered 2016-06-07 – 2016-06-13 (×7): 2.5 mg via ORAL
  Filled 2016-06-06 (×7): qty 1

## 2016-06-06 MED ORDER — DIPHENHYDRAMINE HCL 25 MG PO CAPS
25.0000 mg | ORAL_CAPSULE | ORAL | Status: DC | PRN
  Administered 2016-06-07 – 2016-06-12 (×6): 50 mg via ORAL
  Filled 2016-06-06 (×7): qty 2

## 2016-06-06 MED ORDER — SODIUM CHLORIDE 0.9 % IV SOLN
25.0000 mg | INTRAVENOUS | Status: DC | PRN
  Filled 2016-06-06: qty 0.5

## 2016-06-06 MED ORDER — POLYETHYLENE GLYCOL 3350 17 G PO PACK: 17.0000 g | PACK | Freq: Every day | ORAL | Status: DC | PRN

## 2016-06-06 MED ORDER — HYDROMORPHONE HCL 1 MG/ML IJ SOLN
2.0000 mg | INTRAMUSCULAR | Status: DC
  Administered 2016-06-06: 2 mg via INTRAVENOUS
  Filled 2016-06-06: qty 2

## 2016-06-06 NOTE — H&P (Signed)
History and Physical  Patient Name: [name]     MLY:[id number]    DOB: 03-26-1980    DOA: 06/06/2016 PCP: Tresa Garter, MD   Patient coming from: Home  Chief Complaint: Pain crisis  HPI: [name] is a 36 y.o. male with a past medical history significant for sickle cell disease and pulmonary hypertension on home O2, chronic PE on Xarelto and HTN who presents with complaints of sickle cell pain.  The patient was in his usual state of health until a few days ago he developed low back pain. This was constant, aching, and similar to his previous sickle cell crisis pain. It did not improve his home pain medicines. It was associated with some nausea and emesis, but no chest pain, shortness of breath, cough, dysuria.  ED course: -Afebrile, heart rate 70, respirations normal, pulse ox 88-92% on home 4L O2, BP 118/92 -Na 136, K 4.0, Cr 0.68, WBC 12.2K, Hgb 7.9 (baseline 7-8) -Lipase normal -CXR showed prominent vessels, no airspace disease -ECG showed old IVCD and left axis, no ST changes -He was given hydromorphone IV per protocol without appreciable improvement in pain, so TR[facility] were asked to admit for [place] pain crisis     ROS: Review of Systems  Constitutional: Negative for chills, fever and malaise/fatigue.  Respiratory: Negative for cough, hemoptysis, sputum production, shortness of breath and wheezing.   Cardiovascular: Negative for chest pain, palpitations, leg swelling and PND.  Gastrointestinal: Positive for nausea and vomiting. Negative for abdominal pain and diarrhea.  Genitourinary: Negative for dysuria, flank pain, frequency, hematuria and urgency.  All other systems reviewed and are negative.         Past Medical History:  Diagnosis Date  . Acute chest syndrome ([place]) 06/18/2013  . Acute embolism and thrombosis of right internal jugular vein ([place])   . Alcohol consumption of one to four drinks per day   . Avascular necrosis (HCC)    Right Hip  . Blood  transfusion   . Chronic anticoagulation   . Demand ischemia ([place]) 01/02/2014  . Former smoker   . Functional asplenia   . Hb-SS disease with crisis ([place])   . History of Clostridium difficile infection   . History of pulmonary embolus (PE)   . Hypertension   . Hypokalemia   . Leukocytosis    Chronic  . Mood disorder ([place])   . Noncompliance with medication regimen   . Oxygen deficiency   . Pulmonary hypertension ([place])   . Second hand tobacco smoke exposure   . Sickle cell anemia (HCC)   . Sickle-cell crisis with associated acute chest syndrome ([place]) 05/13/2013  . Stroke ([place])   . Thrombocytosis (HCC)    Chronic  . Uses marijuana     Past Surgical History:  Procedure Laterality Date  . CHOLECYSTECTOMY     01/2008  . Excision of left periauricular cyst     10/2009  . Excision of right ear lobe cyst with primary closur     11/2007  . Porta cath placement    . Porta cath removal    . PORTACATH PLACEMENT  01/05/2012   Procedure: INSERTION PORT-A-CATH;  Surgeon: Odis Hollingshead, MD;  Location: [facility];  Service: General;  Laterality: N/A;  ultrasound guiced port a cath insertion with fluoroscopy  . Right hip replacement     08/2006  . UMBILICAL HERNIA REPAIR     01/2008    Social History: The patient walks unassisted.  Former smoker.  No Known Allergies  Family history: family history includes Depression in his mother; Diabetes in his mother; Sickle cell trait in his brother, father, and mother.  Prior to Admission medications   Medication Sig Start Date End Date Taking? Authorizing Provider  carvedilol (COREG) 3.125 MG tablet Take 1 tablet (3.125 mg total) by mouth 2 (two) times daily with a meal. 05/17/16  Yes [name], MD  cholecalciferol (VITAMIN D) 1000 units tablet Take 2,000 Units by mouth daily.   Yes [provider]  folic acid (FOLVITE) 1 MG tablet Take 2 tablets (2 mg total) by mouth daily. 04/25/16  Yes [name], MD  gabapentin  (NEURONTIN) 300 MG capsule Take 1 capsule (300 mg total) by mouth 3 (three) times daily. 06/01/15  Yes Tresa Garter, MD  HYDROmorphone (DILAUDID) 4 MG tablet Take 2 tablets (8 mg total) by mouth every 4 (four) hours as needed for moderate pain or severe pain. 06/02/16 06/17/16 Yes Jegede, Marlena Clipper, MD  hydroxyurea (HYDREA) 500 MG capsule Take 1000 mg QOD and alternate with 500 mg QOD. May take with food to minimize GI side effects. Patient taking differently: Take 500-1,000 mg by mouth See admin instructions. Take 1000 mg QOD and alternate with 500 mg QOD. May take with food to minimize GI side effects. 04/25/16  Yes [name], MD  L-glutamine (ENDARI) 5 g PACK Powder Packet Take 15 g by mouth 2 (two) times daily. 04/25/16  Yes [name], MD  lisinopril (PRINIVIL,ZESTRIL) 2.5 MG tablet Take 2.5 mg by mouth daily. 05/22/16  Yes [provider]  morphine (MS CONTIN) 30 MG 12 hr tablet Take 1 tablet (30 mg total) by mouth every 12 (twelve) hours. 06/02/16 07/02/16 Yes Jegede, Marlena Clipper, MD  potassium chloride SA (K-DUR,KLOR-CON) 20 MEQ tablet Take 2 tablets (40 mEq total) by mouth daily. 04/27/16  Yes [name], MD  rivaroxaban (XARELTO) 20 MG TABS tablet Take 1 tablet (20 mg total) by mouth at bedtime. 05/24/16  Yes Dorena Dew, FNP  torsemide (DEMADEX) 20 MG tablet Take 1 tablet (20 mg total) by mouth daily. 05/08/16  Yes [name], MD  zolpidem (AMBIEN) 10 MG tablet Take 1 tablet (10 mg total) by mouth at bedtime as needed for sleep. 06/02/16 07/02/16 Yes Tresa Garter, MD       Physical Exam: BP (!) 141/91 (BP Location: Right Arm)   Pulse 90   Temp 98.8 F (37.1 C) (Oral)   Resp (!) 26   SpO2 91%  General appearance: Well-developed, adult male, alert and in no acute distress from pain.   Eyes: Icteric, conjunctiva pink, lids and lashes normal. PERRL.    ENT: No nasal deformity, discharge, epistaxis.  Hearing normal. OP moist  without lesions.   Neck: No neck masses.  Trachea midline.  No thyromegaly/tenderness. Lymph: No cervical or supraclavicular lymphadenopathy. Skin: Warm and dry.  No suspicious rashes or lesions. Cardiac: RRR, nl S1-S2, no murmurs appreciated.  Capillary refill is brisk.  No LE edema.  Radial and DP pulses 2+ and symmetric. Respiratory: Normal respiratory rate and rhythm.  CTAB without rales or wheezes. Abdomen: Abdomen soft.  No TTP. No ascites, distension, hepatosplenomegaly.   MSK: No deformities or effusions.  No cyanosis or clubbing. Neuro: Cranial nerves grossly normal.  Sensation intact to light touch. Speech is fluent.  Muscle strength normal.    Psych: Sensorium intact and responding to questions, attention normal.  Behavior appropriate.  Affect flat.  Judgment and insight appear normal.     Labs on Admission:  I have personally reviewed following labs and imaging studies: CBC:  Recent Labs Lab 05/31/16 0520 06/02/16 0648 06/04/16 0520 06/05/16 0500 06/06/16 1752  WBC 13.8* 12.3* 12.0* 10.7* 12.2*  NEUTROABS 9.4* 8.0* 7.6 6.2 9.1*  HGB 7.9* 7.0* 6.2* 7.0* 7.9*  HCT 24.4* 20.7* 18.3* 21.7* 24.3*  MCV 90.4 89.6 89.3 92.3 88.0  PLT 579* 594* 491* 518* 161*   Basic Metabolic Panel:  Recent Labs Lab 05/31/16 0520 06/06/16 1752  NA 136 136  K 3.6 4.0  CL 105 108  CO2 22 21*  GLUCOSE 139* 106*  BUN 8 6  CREATININE 1.17 0.68  CALCIUM 9.0 9.1  MG 1.6*  --   PHOS 4.4  --    GFR: Estimated Creatinine Clearance: 138.8 mL/min (by C-G formula based on SCr of 0.68 mg/dL).  Liver Function Tests:  Recent Labs Lab 06/06/16 1752  AST 56*  ALT 44  ALKPHOS 125  BILITOT 4.7*  PROT 7.5  ALBUMIN 4.2    Recent Labs Lab 06/06/16 1752  LIPASE 21   No results for input(s): AMMONIA in the last 168 hours. Coagulation Profile: No results for input(s): INR, PROTIME in the last 168 hours. Cardiac Enzymes: No results for input(s): CKTOTAL, CKMB, CKMBINDEX, TROPONINI in  the last 168 hours. BNP (last 3 results) No results for input(s): PROBNP in the last 8760 hours. HbA1C: No results for input(s): HGBA1C in the last 72 hours. CBG: No results for input(s): GLUCAP in the last 168 hours. Lipid Profile: No results for input(s): CHOL, HDL, LDLCALC, TRIG, CHOLHDL, LDLDIRECT in the last 72 hours. Thyroid Function Tests: No results for input(s): TSH, T4TOTAL, FREET4, T3FREE, THYROIDAB in the last 72 hours. Anemia Panel:  Recent Labs  06/04/16 0520 06/06/16 1752  RETICCTPCT 6.8* 10.9*   Sepsis Labs: Invalid input(s): PROCALCITONIN, LACTICIDVEN No results found for this or any previous visit (from the past 240 hour(s)).       Radiological Exams on Admission: Personally reviewed CXR shows prominent vessels, no pneumonia or consolidation: Dg Chest 2 View  Result Date: 06/06/2016 CLINICAL DATA:  Rib pain. Shortness of breath today. History of sickle cell anemia. EXAM: CHEST  2 VIEW COMPARISON:  Radiographs 05/28/2016, most recent chest CT 05/06/2016 FINDINGS: Left chest port remains in place. Cardiomegaly is similar to prior. Question of worsening vascular congestion versus differences in AP technique. No definite pleural effusion. No focal airspace disease. No pneumothorax. No acute rib abnormalities identified radiographically. IMPRESSION: Similar cardiomegaly. Question of worsening vascular congestion versus differences in technique. No new focal airspace disease. Electronically Signed   By: Jeb Levering M.D.   On: 06/06/2016 18:32    EKG: Independently reviewed. Rate 64, QTC normal 447, IVCD with LAD, old.  No ST changes.  Echocardiogram 2017: EF 55-60% PAP 78, Pulm consult recmomended.  Patient has not seen Pulmonology ever as an outpatient, saw once last summer inpatient because of hypoxia, outpatient Pulm f/u for his pHTN was recommended.         Assessment/Plan    1. Vaso-occlussive sickle cell pain crisis: Will admit per sickle cell  protocol for pain control.  No evidence of acute chest at this time. -Weight based high-dose IV hydromorphone PCA with continuous ETCO2 monitoring  Demand dose 0.8, total hourly dose of 4.8 -Continue home MS Contin -IV Ketorolac deferred -Hydrate with IVF D5 .45% saline @ 100 cc/hr  -Continue hydroxyurea and folic acid -Diphenhydramine, ondansetron and  bowel regimen    2. Sickle cell anemia:  Baseline Hgb 7-8 -Transfuse as needed if Hg drops significantly below baseline.  3. Pulmonary hypertension:  Old notes suggest he was on ambrisentan.  Currently not. -Outpatient follow up with Pulmonology  4. Hypertension:  Normotensive at admission. -Continue lisinopril, carvedilol, torsemide, potassium supplement  5. History of PE:  -Continue rivaroxaban  6. Other medications:  -Continue gabapentin -Continue L-glutamine      DVT prophylaxis: N/A on rivarox  Code Status: FULL  Family Communication: None present  Disposition Plan: Initiate IV hydromorphone PCA now, Sickle cell team to take over management in AM. Consults called: None Admission status: INPATIENT, med surg         Medical decision making: Patient seen at 10:30 PM on 06/06/2016.  What exists of the patient's chart was reviewed in depth and summarized above.  Clinical condition: currently hemodynamically stable with continuous respiratory monitoring.        Edwin Dada [facility] Pager [phone]

## 2016-06-06 NOTE — ED Provider Notes (Signed)
[facility] DEPT Provider Note   CSN: [id number] Arrival date & time: 06/06/16  1608     History   Chief Complaint Chief Complaint  Patient presents with  . Sickle Cell Pain Crisis    HPI [name] is a 36 y.o. male.  The history is provided by the patient.  Sickle Cell Pain Crisis  Location:  Abdomen and back (bilateral rib pain) Onset quality:  Gradual Duration:  8 hours Similar to previous crisis episodes: yes   Timing:  Constant Progression:  Worsening Chronicity:  Recurrent History of pulmonary emboli: yes   Context: not alcohol consumption, not dehydration, not infection and not non-compliance   Relieved by:  Nothing Worsened by:  Activity and movement Associated symptoms: fatigue   Associated symptoms: no congestion, no cough, no fever, no nausea, no shortness of breath and no vomiting     Past Medical History:  Diagnosis Date  . Acute chest syndrome ([place]) 06/18/2013  . Acute embolism and thrombosis of right internal jugular vein ([place])   . Alcohol consumption of one to four drinks per day   . Avascular necrosis (HCC)    Right Hip  . Blood transfusion   . Chronic anticoagulation   . Demand ischemia ([place]) 01/02/2014  . Former smoker   . Functional asplenia   . Hb-SS disease with crisis ([place])   . History of Clostridium difficile infection   . History of pulmonary embolus (PE)   . Hypertension   . Hypokalemia   . Leukocytosis    Chronic  . Mood disorder ([place])   . Noncompliance with medication regimen   . Oxygen deficiency   . Pulmonary hypertension ([place])   . Second hand tobacco smoke exposure   . Sickle cell anemia (HCC)   . Sickle-cell crisis with associated acute chest syndrome ([place]) 05/13/2013  . Stroke ([place])   . Thrombocytosis (HCC)    Chronic  . Uses marijuana     Patient Active Problem List   Diagnosis Date Noted  . Respiratory failure, acute and chronic ([place]) 05/14/2016  . Acute pulmonary edema ([place]) 04/11/2016  . H/O arterial  ischemic stroke 11/11/2015  . Slurred speech 11/10/2015  . Chronic anemia   . Hb-SS disease without crisis ([place]) 10/07/2014  . Acute on chronic respiratory failure with hypoxia ([place])   . Prolonged Q-T interval on ECG 09/10/2014  . Troponin level elevated 08/29/2014  . Cor pulmonale, chronic ([place]) 08/25/2014  . Sickle cell anemia ([place]) 06/25/2014  . Anemia of chronic disease 06/25/2014  . PAH (pulmonary artery hypertension) ([place]) 03/18/2014  . Chronic respiratory failure with hypoxia ([place]) 03/14/2014  . Paralytic strabismus, external ophthalmoplegia   . Chronic pain syndrome 12/12/2013  . Chronic anticoagulation 08/22/2013  . Essential hypertension 08/22/2013  . Pulmonary HTN ([place]) 06/18/2013  . Functional asplenia   . Vitamin D deficiency 02/13/2013  . Rib pain 01/13/2013  . Hx of pulmonary embolus 06/29/2012  . 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 Avascular necrosis ([place])   . Sickle cell pain crisis [facility])     Past Surgical History:  Procedure Laterality Date  . CHOLECYSTECTOMY     01/2008  . Excision of left periauricular cyst     10/2009  . Excision of right ear lobe cyst with primary closur     11/2007  . Porta cath placement    . Porta cath removal    . PORTACATH PLACEMENT  01/05/2012   Procedure: INSERTION PORT-A-CATH;  Surgeon: Odis Hollingshead, MD;  Location: [facility]  OR;  Service: General;  Laterality: N/A;  ultrasound guiced port a cath insertion with fluoroscopy  . Right hip replacement     08/2006  . UMBILICAL HERNIA REPAIR     01/2008       Home Medications    Prior to Admission medications   Medication Sig Start Date End Date Taking? Authorizing Provider  carvedilol (COREG) 3.125 MG tablet Take 1 tablet (3.125 mg total) by mouth 2 (two) times daily with a meal. 05/17/16  Yes [name], MD  cholecalciferol (VITAMIN D) 1000 units tablet Take 2,000 Units by mouth daily.   Yes [provider]  folic acid (FOLVITE) 1 MG tablet Take 2 tablets  (2 mg total) by mouth daily. 04/25/16  Yes [name], MD  gabapentin (NEURONTIN) 300 MG capsule Take 1 capsule (300 mg total) by mouth 3 (three) times daily. 06/01/15  Yes Tresa Garter, MD  HYDROmorphone (DILAUDID) 4 MG tablet Take 2 tablets (8 mg total) by mouth every 4 (four) hours as needed for moderate pain or severe pain. 06/02/16 06/17/16 Yes Jegede, Marlena Clipper, MD  hydroxyurea (HYDREA) 500 MG capsule Take 1000 mg QOD and alternate with 500 mg QOD. May take with food to minimize GI side effects. Patient taking differently: Take 500-1,000 mg by mouth See admin instructions. Take 1000 mg QOD and alternate with 500 mg QOD. May take with food to minimize GI side effects. 04/25/16  Yes [name], MD  L-glutamine (ENDARI) 5 g PACK Powder Packet Take 15 g by mouth 2 (two) times daily. 04/25/16  Yes [name], MD  lisinopril (PRINIVIL,ZESTRIL) 2.5 MG tablet Take 2.5 mg by mouth daily. 05/22/16  Yes [provider]  morphine (MS CONTIN) 30 MG 12 hr tablet Take 1 tablet (30 mg total) by mouth every 12 (twelve) hours. 06/02/16 07/02/16 Yes Jegede, Marlena Clipper, MD  potassium chloride SA (K-DUR,KLOR-CON) 20 MEQ tablet Take 2 tablets (40 mEq total) by mouth daily. 04/27/16  Yes [name], MD  rivaroxaban (XARELTO) 20 MG TABS tablet Take 1 tablet (20 mg total) by mouth at bedtime. 05/24/16  Yes Dorena Dew, FNP  torsemide (DEMADEX) 20 MG tablet Take 1 tablet (20 mg total) by mouth daily. 05/08/16  Yes [name], MD  zolpidem (AMBIEN) 10 MG tablet Take 1 tablet (10 mg total) by mouth at bedtime as needed for sleep. 06/02/16 07/02/16 Yes Tresa Garter, MD    Family History Family History  Problem Relation Age of Onset  . Sickle cell trait Mother   . Depression Mother   . Diabetes Mother   . Sickle cell trait Father   . Sickle cell trait Brother     Social History Social History  Substance Use Topics  . Smoking status: Former Smoker     Packs/day: 0.50    Years: 10.00    Types: Cigarettes    Quit date: 05/29/2011  . Smokeless tobacco: Never Used  . Alcohol use No     Allergies   Patient has no known allergies.   Review of Systems Review of Systems  Constitutional: Positive for fatigue. Negative for fever.  HENT: Negative for congestion.   Respiratory: Negative for cough and shortness of breath.   Gastrointestinal: Negative for nausea and vomiting.  All other systems are reviewed and are negative for acute change except as noted in the HPI    Physical Exam Updated Vital Signs BP 135/79 (BP Location: Left Arm)   Pulse  88   Temp 98.8 F (37.1 C) (Oral)   Resp 16   SpO2 93%   Physical Exam  Constitutional: He is oriented to person, place, and time. He appears well-developed and well-nourished. No distress.  HENT:  Head: Normocephalic and atraumatic.  Nose: Nose normal.  Eyes: Conjunctivae and EOM are normal. Pupils are equal, round, and reactive to light. Right eye exhibits no discharge. Left eye exhibits no discharge. Scleral icterus (mild) is present.  Neck: Normal range of motion. Neck supple.  Cardiovascular: Normal rate and regular rhythm.  Exam reveals no gallop and no friction rub.   No murmur heard. Pulmonary/Chest: Effort normal and breath sounds normal. No stridor. No respiratory distress. He has no rales.  Abdominal: Soft. He exhibits no distension. There is tenderness in the right upper quadrant and epigastric area.  Musculoskeletal: He exhibits no edema or tenderness.  Neurological: He is alert and oriented to person, place, and time.  Skin: Skin is warm and dry. No rash noted. He is not diaphoretic. No erythema.  Psychiatric: He has a normal mood and affect.  Vitals reviewed.    ED Treatments / Results  Labs (all labs ordered are listed, but only abnormal results are displayed) Labs Reviewed  CBC WITH DIFFERENTIAL/PLATELET - Abnormal; Notable for the following:       Result Value    WBC 12.2 (*)    RBC 2.76 (*)    Hemoglobin 7.9 (*)    HCT 24.3 (*)    RDW 20.8 (*)    Platelets 549 (*)    Neutro Abs 9.1 (*)    Monocytes Absolute 1.6 (*)    All other components within normal limits  RETICULOCYTES - Abnormal; Notable for the following:    Retic Ct Pct 10.9 (*)    RBC. 2.76 (*)    Retic Count, Manual 300.8 (*)    All other components within normal limits  COMPREHENSIVE METABOLIC PANEL - Abnormal; Notable for the following:    CO2 21 (*)    Glucose, Bld 106 (*)    AST 56 (*)    Total Bilirubin 4.7 (*)    All other components within normal limits  RAPID URINE DRUG SCREEN, HOSP PERFORMED - Abnormal; Notable for the following:    Opiates POSITIVE (*)    All other components within normal limits  LIPASE, BLOOD    EKG  EKG Interpretation  Date/Time:  Monday Jun 06 2016 18:20:14 EDT Ventricular Rate:  64 PR Interval:    QRS Duration: 124 QT Interval:  433 QTC Calculation: 447 R Axis:   -95 Text Interpretation:  Sinus rhythm Prolonged PR interval Left atrial enlargement Nonspecific IVCD with LAD Inferior infarct, old Lateral leads are also involved No significant change since last tracing Confirmed by [facility] MD, PEDRO [phone]) on 06/06/2016 8:49:21 PM       Radiology Dg Chest 2 View  Result Date: 06/06/2016 CLINICAL DATA:  Rib pain. Shortness of breath today. History of sickle cell anemia. EXAM: CHEST  2 VIEW COMPARISON:  Radiographs 05/28/2016, most recent chest CT 05/06/2016 FINDINGS: Left chest port remains in place. Cardiomegaly is similar to prior. Question of worsening vascular congestion versus differences in AP technique. No definite pleural effusion. No focal airspace disease. No pneumothorax. No acute rib abnormalities identified radiographically. IMPRESSION: Similar cardiomegaly. Question of worsening vascular congestion versus differences in technique. No new focal airspace disease. Electronically Signed   By: Jeb Levering M.D.   On: 06/06/2016 18:32     Procedures  Procedures (including critical care time)  Medications Ordered in ED Medications  morphine (MS CONTIN) 12 hr tablet 30 mg (30 mg Oral Given 06/07/16 0033)  lisinopril (PRINIVIL,ZESTRIL) tablet 2.5 mg (not administered)  rivaroxaban (XARELTO) tablet 20 mg (20 mg Oral Given 06/07/16 0033)  carvedilol (COREG) tablet 3.125 mg (not administered)  torsemide (DEMADEX) tablet 20 mg (not administered)  folic acid (FOLVITE) tablet 2 mg (not administered)  hydroxyurea (HYDREA) capsule 500-1,000 mg (not administered)  L-glutamine (ENDARI) Powder Packet 15 g (15 g Oral Given 06/07/16 0033)  potassium chloride SA (K-DUR,KLOR-CON) CR tablet 40 mEq (not administered)  gabapentin (NEURONTIN) capsule 300 mg (300 mg Oral Given 06/07/16 0033)  senna-docusate (Senokot-S) tablet 1 tablet (1 tablet Oral Not Given 06/07/16 0034)  polyethylene glycol (MIRALAX / GLYCOLAX) packet 17 g (not administered)  naloxone (NARCAN) injection 0.4 mg (not administered)    And  sodium chloride flush (NS) 0.9 % injection 9 mL (not administered)  ondansetron (ZOFRAN) injection 4 mg (not administered)  diphenhydrAMINE (BENADRYL) capsule 25-50 mg (not administered)    Or  diphenhydrAMINE (BENADRYL) 25 mg in sodium chloride 0.9 % 50 mL IVPB (not administered)  HYDROmorphone (DILAUDID) 1 mg/mL PCA injection ( Intravenous Set-up / Initial Syringe 06/07/16 0028)  dextrose 5 %-0.45 % sodium chloride infusion ( Intravenous New Bag/Given 06/07/16 0035)  zolpidem (AMBIEN) tablet 10 mg (not administered)  ketorolac (TORADOL) 30 MG/ML injection 30 mg (30 mg Intravenous Given 06/06/16 1800)  HYDROmorphone (DILAUDID) injection 2 mg (2 mg Intravenous Given 06/06/16 1800)    Or  HYDROmorphone (DILAUDID) injection 2 mg ( Subcutaneous See Alternative 06/06/16 1800)  HYDROmorphone (DILAUDID) injection 2 mg (2 mg Intravenous Given 06/06/16 1844)    Or  HYDROmorphone (DILAUDID) injection 2 mg ( Subcutaneous See Alternative 06/06/16 1844)    HYDROmorphone (DILAUDID) injection 2 mg (2 mg Intravenous Given 06/06/16 1925)    Or  HYDROmorphone (DILAUDID) injection 2 mg ( Subcutaneous See Alternative 06/06/16 1925)  diphenhydrAMINE (BENADRYL) injection 25 mg (25 mg Intravenous Given 06/06/16 1800)  dextrose 5 % and 0.45 % NaCl with KCl 20 mEq/L infusion ( Intravenous Rate/Dose Verify 06/06/16 2136)  diphenhydrAMINE (BENADRYL) capsule 25 mg (25 mg Oral Given 06/06/16 1948)     Initial Impression / Assessment and Plan / ED Course  I have reviewed the triage vital signs and the nursing notes.  Pertinent labs & imaging results that were available during my care of the patient were reviewed by me and considered in my medical decision making (see chart for details).     Typical sickle cell crisis for the patient. Chest x-ray without evidence of acute chest. Abdominal labs without evidence of biliary obstruction. Patient's bilirubin at his baseline likely secondary to hemolysis.patient provided with IV fluids and pain medicine which do not have significant improvement in his pain. We'll admit for further management.  Final Clinical Impressions(s) / ED Diagnoses   Final diagnoses:  Sickle cell pain crisis ([place])      Cardama, Grayce Sessions, MD 06/07/16 [phone]

## 2016-06-06 NOTE — ED Triage Notes (Signed)
Per EMS, pt from home.  Pt c/o leg and back pain.  Hx of same with sickle cell pain.  Started today.  Vitals:  154/100, hr 66, resp 94% ra

## 2016-06-06 NOTE — Patient Outreach (Signed)
[facility]) Care Management  06/06/16  CHRISTOFER SHEN 07/07/1980 [id number]  Initial outreach attempt for transition of care. Patient was hospitalized 4/30-06/05/2016 due to a sickle cell crisis. Patient also has a significant history of pulmonary embolus and pulmonary artery hypertension with follow up care at [facility].   Attempted to reach patient without success. Left HIPAA compliant voicemail with RNCM contact information and invited callback.  [place] R. Analayah Brooke, RN, BSN, [place] Management Coordinator [phone]

## 2016-06-06 NOTE — ED Notes (Signed)
Patient aware of pending urine sample. Patient will call out when ready.

## 2016-06-07 ENCOUNTER — Other Ambulatory Visit: Payer: Self-pay

## 2016-06-07 DIAGNOSIS — D638 Anemia in other chronic diseases classified elsewhere: Secondary | ICD-10-CM | POA: Diagnosis not present

## 2016-06-07 DIAGNOSIS — Z7901 Long term (current) use of anticoagulants: Secondary | ICD-10-CM

## 2016-06-07 DIAGNOSIS — Z818 Family history of other mental and behavioral disorders: Secondary | ICD-10-CM | POA: Diagnosis not present

## 2016-06-07 DIAGNOSIS — G894 Chronic pain syndrome: Secondary | ICD-10-CM

## 2016-06-07 DIAGNOSIS — D5700 Hb-SS disease with crisis, unspecified: Secondary | ICD-10-CM | POA: Diagnosis not present

## 2016-06-07 DIAGNOSIS — Z86711 Personal history of pulmonary embolism: Secondary | ICD-10-CM | POA: Diagnosis not present

## 2016-06-07 DIAGNOSIS — N179 Acute kidney failure, unspecified: Secondary | ICD-10-CM | POA: Diagnosis not present

## 2016-06-07 DIAGNOSIS — J9611 Chronic respiratory failure with hypoxia: Secondary | ICD-10-CM | POA: Diagnosis not present

## 2016-06-07 DIAGNOSIS — Z833 Family history of diabetes mellitus: Secondary | ICD-10-CM | POA: Diagnosis not present

## 2016-06-07 MED ORDER — ZOLPIDEM TARTRATE 10 MG PO TABS
10.0000 mg | ORAL_TABLET | Freq: Every evening | ORAL | Status: DC | PRN
  Administered 2016-06-07 – 2016-06-12 (×6): 10 mg via ORAL
  Filled 2016-06-07 (×7): qty 1

## 2016-06-07 MED ORDER — RIVAROXABAN 20 MG PO TABS
20.0000 mg | ORAL_TABLET | Freq: Every day | ORAL | Status: DC
  Administered 2016-06-07 – 2016-06-12 (×6): 20 mg via ORAL
  Filled 2016-06-07 (×6): qty 1

## 2016-06-07 MED ORDER — HYDROMORPHONE 1 MG/ML IV SOLN
INTRAVENOUS | Status: DC
  Administered 2016-06-07: 5.2 mg via INTRAVENOUS
  Administered 2016-06-08: 5.99 mg via INTRAVENOUS
  Administered 2016-06-08: 0 mg via INTRAVENOUS
  Administered 2016-06-08: 9.6 mg via INTRAVENOUS
  Administered 2016-06-08: 7.2 mg via INTRAVENOUS
  Administered 2016-06-08: 16:00:00 via INTRAVENOUS
  Administered 2016-06-08: 11.4 mg via INTRAVENOUS
  Administered 2016-06-08: 1.2 mg via INTRAVENOUS
  Administered 2016-06-09: 9 mg via INTRAVENOUS
  Administered 2016-06-09: 2.39 mg via INTRAVENOUS
  Administered 2016-06-09: 11.4 mg via INTRAVENOUS
  Administered 2016-06-09: 05:00:00 via INTRAVENOUS
  Administered 2016-06-09: 16.2 mg via INTRAVENOUS
  Administered 2016-06-09: 18:00:00 via INTRAVENOUS
  Administered 2016-06-09: 5.4 mg via INTRAVENOUS
  Administered 2016-06-09: 6.6 mg via INTRAVENOUS
  Administered 2016-06-10: 0.1 mg via INTRAVENOUS
  Administered 2016-06-10: 6.3 mg via INTRAVENOUS
  Administered 2016-06-10: 10.2 mg via INTRAVENOUS
  Administered 2016-06-10: 4.2 mg via INTRAVENOUS
  Administered 2016-06-10 (×2): via INTRAVENOUS
  Administered 2016-06-10: 15 mg via INTRAVENOUS
  Administered 2016-06-10: 6 mg via INTRAVENOUS
  Administered 2016-06-11: 17.93 mg via INTRAVENOUS
  Administered 2016-06-11: 25 mg via INTRAVENOUS
  Administered 2016-06-11: 2.4 mg via INTRAVENOUS
  Administered 2016-06-11: 4.19 mg via INTRAVENOUS
  Administered 2016-06-11: 6.6 mg via INTRAVENOUS
  Administered 2016-06-11: 6 mg via INTRAVENOUS
  Administered 2016-06-12: 7.2 mg via INTRAVENOUS
  Administered 2016-06-12: 10.5 mg via INTRAVENOUS
  Administered 2016-06-12: 4.8 mg via INTRAVENOUS
  Administered 2016-06-12 (×2): via INTRAVENOUS
  Administered 2016-06-12: 10.2 mg via INTRAVENOUS
  Administered 2016-06-12: 7.8 mg via INTRAVENOUS
  Administered 2016-06-13: 0 mg via INTRAVENOUS
  Administered 2016-06-13: 3.6 mg via INTRAVENOUS
  Administered 2016-06-13: 25 mg via INTRAVENOUS
  Administered 2016-06-13: 8.4 mg via INTRAVENOUS
  Administered 2016-06-13: 4.2 mg via INTRAVENOUS
  Administered 2016-06-13: 0.6 mg via INTRAVENOUS
  Filled 2016-06-07 (×10): qty 25

## 2016-06-07 MED ORDER — HYDROXYUREA 500 MG PO CAPS
500.0000 mg | ORAL_CAPSULE | ORAL | Status: DC
  Administered 2016-06-08 – 2016-06-12 (×3): 500 mg via ORAL
  Filled 2016-06-07 (×6): qty 1

## 2016-06-07 NOTE — Progress Notes (Signed)
IV to Oral Route Change Policy  RECOMMENDATION: This patient is ordered diphenhydramine by the intravenous route.  Based on criteria approved by the Pharmacy and Therapeutics Committee, intravenous diphenhydramine is being converted to the equivalent oral dose form(s).   DESCRIPTION: These criteria include:  Diphenhydramine is not prescribed to treat or prevent a severe allergic reaction  Diphenhydramine is not prescribed as premedication prior to receiving blood product, biologic medication, antimicrobial, or chemotherapy agent  The patient has tolerated at least one dose of an oral or enteral medication  The patient has no evidence of active gastrointestinal bleeding or impaired GI absorption (gastrectomy, short bowel, patient on TNA or NPO).  The patient is not undergoing procedural sedation   If you have questions about this conversion, please contact the Pharmacy Department  []   [phone] )  [facility] []   [phone] )  [facility] []   [phone] )  [facility] []   [phone] )  [facility] [x]   [phone] )  [facility]     Lindell Spar, PharmD, [place] Pager: [phone] 06/07/2016 10:29 AM

## 2016-06-07 NOTE — Progress Notes (Signed)
SICKLE CELL SERVICE PROGRESS NOTE  [name] JKK:[id number] DOB: 05/14/1980 DOA: 06/06/2016 PCP: Tresa Garter, MD  Assessment/Plan: Principal Problem:   Sickle cell pain crisis ([place]) Active Problems:   Pulmonary HTN (HCC)   Chronic anticoagulation   Chronic respiratory failure with hypoxia (HCC)   Anemia of chronic disease   Essential hypertension   Chronic pain syndrome  1. Hb SS with crisis: PCA adjusted to bolus dose of 0.6 mg. Continue Toradol decrease IVF to [facility].  2. Chronic Pain: [name] is prescribed MS Contin. However based on his recent usage, there seems to be a pseudo-addiction occurring leading to usage in doses greater than prescribed.  3. PAH with chronic respiratory failure with hypoxia: At baseline. [name] scheduled for RBC Pheresis at [facility] on 06/13/2016 and with Pulmonology on 06/28/2016. 4. Anemia of Chronic Disease: Hb at baseline 5. Leukocytosis: WBC at baseline. 6. Chronic Anticoagulation: Continue Xarelto 7. H/O Pulmonary Edema: [name] had been on Torsemide however this was discontinued and is to be taken only as needed. [name]'s weight at the time of discharge from [facility] was 77.6 kg. Will continue Torsemide for now.  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   Code Status: Full Code Family Communication: N/A Disposition Plan: Not yet ready for discharge  [place].  Pager [phone]. If 7PM-7AM, please contact night-coverage.  06/07/2016, 4:59 PM  LOS: 1 day   Interim History: [name] came back to hospital 2 days after discharged in a setting of not having MS Contin at home. [name] states that he has been unable to pick up his prescription as his brother is using his car. However per [place] he received a one month supply on 4/10 /2018. Also he has been hospitalized from 4/14-4/16 and from 4/30 -5/6.Thus it is unclear as to why he does not have his MS Contin.It appears that he is using the ED as a  substitute for his MS Contin which according to patient, he  has not had for several days.  Today his pain is 8/10 and localized to ribs, low back and legs. [name] up ambulating without Oxygen and saturations in the 90's.   Consultants:  None  Procedures:  None  Antibiotics:  None   Objective: Vitals:   06/07/16 1315 06/07/16 1438 06/07/16 1611 06/07/16 1617  BP:  114/83    Pulse:  92  93  Resp: 12 (!) 22    Temp:  98.1 F (36.7 C)    TempSrc:  Oral    SpO2: 92% 94% 94% 99%  Weight:      Height:       Weight change:   Intake/Output Summary (Last 24 hours) at 06/07/16 1659 Last data filed at 06/07/16 1439  Gross per 24 hour  Intake              564 ml  Output             3725 ml  Net            -3161 ml     Physical Exam General: Alert, awake, oriented x3, in no acute distress.  HEENT: /AT PEERL, EOMI, anicteric Neck: Trachea midline,  no masses, no thyromegal,y no JVD, no carotid bruit OROPHARYNX:  Moist, No exudate/ erythema/lesions.  Heart: Regular rate and rhythm, without murmurs, rubs, gallops, PMI non-displaced, no heaves or thrills on palpation.  Lungs: Clear to auscultation, no wheezing or rhonchi noted.  No increased vocal fremitus resonant to percussion  Abdomen: Soft, nontender, nondistended, positive bowel sounds, no masses no hepatosplenomegaly noted.  Neuro: No focal neurological deficits noted cranial nerves II through XII grossly intact.  Strength at functional baseline in bilateral upper and lower extremities. Musculoskeletal: No warmth swelling or erythema around joints, no spinal tenderness noted. Psychiatric: Patient alert and oriented x3, good insight and cognition, good recent to remote recall.     Data Reviewed: Basic Metabolic Panel:  Recent Labs Lab 06/06/16 1752  NA 136  K 4.0  CL 108  CO2 21*  GLUCOSE 106*  BUN 6  CREATININE 0.68  CALCIUM 9.1   Liver Function Tests:  Recent Labs Lab 06/06/16 1752  AST 56*  ALT 44   ALKPHOS 125  BILITOT 4.7*  PROT 7.5  ALBUMIN 4.2    Recent Labs Lab 06/06/16 1752  LIPASE 21   No results for input(s): AMMONIA in the last 168 hours. CBC:  Recent Labs Lab 06/02/16 0648 06/04/16 0520 06/05/16 0500 06/06/16 1752  WBC 12.3* 12.0* 10.7* 12.2*  NEUTROABS 8.0* 7.6 6.2 9.1*  HGB 7.0* 6.2* 7.0* 7.9*  HCT 20.7* 18.3* 21.7* 24.3*  MCV 89.6 89.3 92.3 88.0  PLT 594* 491* 518* 549*   Cardiac Enzymes: No results for input(s): CKTOTAL, CKMB, CKMBINDEX, TROPONINI in the last 168 hours. BNP (last 3 results)  Recent Labs  04/21/16 1139 05/05/16 1206 05/16/16 1724  BNP 321.8* 639.0* 299.8*    ProBNP (last 3 results) No results for input(s): PROBNP in the last 8760 hours.  CBG: No results for input(s): GLUCAP in the last 168 hours.  No results found for this or any previous visit (from the past 240 hour(s)).   Studies: Dg Chest 2 View  Result Date: 06/06/2016 CLINICAL DATA:  Rib pain. Shortness of breath today. History of sickle cell anemia. EXAM: CHEST  2 VIEW COMPARISON:  Radiographs 05/28/2016, most recent chest CT 05/06/2016 FINDINGS: Left chest port remains in place. Cardiomegaly is similar to prior. Question of worsening vascular congestion versus differences in AP technique. No definite pleural effusion. No focal airspace disease. No pneumothorax. No acute rib abnormalities identified radiographically. IMPRESSION: Similar cardiomegaly. Question of worsening vascular congestion versus differences in technique. No new focal airspace disease. Electronically Signed   By: Jeb Levering M.D.   On: 06/06/2016 18:32   Dg Chest 2 View  Result Date: 05/28/2016 CLINICAL DATA:  36 year old male with sickle cell anemia presenting with bilateral rib pain. EXAM: CHEST  2 VIEW COMPARISON:  Chest radiograph dated 05/14/2016 FINDINGS: Left pectoral subclavian approach infusion catheter with tip over the right atrial silhouette adjacent to the cavoatrial junction. There  is stable cardiomegaly with mild vascular congestion. No new consolidative changes. There is no pleural effusion or pneumothorax. No acute osseous pathology. IMPRESSION: 1. No focal consolidation. 2. Cardiomegaly with probable mild vascular congestion. Electronically Signed   By: Anner Crete M.D.   On: 05/28/2016 04:44   Dg Chest Port 1 View  Result Date: 05/14/2016 CLINICAL DATA:  Acute chest pain.  History of sickle cell anemia. EXAM: PORTABLE CHEST 1 VIEW COMPARISON:  04/29/2016 and prior exams FINDINGS: Cardiomegaly and mild vascular congestion noted. A left Port-A-Cath is present with tip overlying the superior cavoatrial junction. There is no evidence of focal airspace disease, pulmonary edema, suspicious pulmonary nodule/mass, pleural effusion, or pneumothorax. No acute bony abnormalities are identified. Left humeral head AVN again noted. IMPRESSION: Cardiomegaly and mild pulmonary vascular congestion. Electronically Signed   By: Dellis Filbert  Hu M.D.   On: 05/14/2016 10:23    Scheduled Meds: . carvedilol  3.125 mg Oral BID WC  . folic acid  2 mg Oral Daily  . gabapentin  300 mg Oral TID  . HYDROmorphone   Intravenous Q4H  . hydroxyurea  1,000 mg Oral Q48H  . [START ON 06/08/2016] hydroxyurea  500 mg Oral Q48H  . L-glutamine  15 g Oral BID  . lisinopril  2.5 mg Oral Daily  . morphine  30 mg Oral Q12H  . potassium chloride SA  40 mEq Oral Daily  . rivaroxaban  20 mg Oral Q supper  . senna-docusate  1 tablet Oral BID  . torsemide  20 mg Oral Daily   Continuous Infusions: . dextrose 5 % and 0.45% NaCl 100 mL/hr at 06/07/16 1316    Principal Problem:   Sickle cell pain crisis (HCC) Active Problems:   Pulmonary HTN (HCC)   Chronic anticoagulation   Chronic respiratory failure with hypoxia (HCC)   Anemia of chronic disease   Essential hypertension   Chronic pain syndrome    In excess of 25 minutes spent during this visit. Greater than 50% involved face to face contact with the  patient for assessment, counseling and coordination of care.

## 2016-06-07 NOTE — Consult Note (Signed)
   [name] CM Inpatient Consult   06/07/2016  [name] 1980-06-11 [id number]    [name] recently signed up with [facility] program. He was readmitted prior to [place] reaching him. Spoke with patient at bedside to make aware of the name of the [place] who will be making ongoing outreach attempts. He is agreeable. Will continue to follow and update [facility] RNCM about bedside encounter.    Marthenia Rolling, MSN-Ed, RN,BSN [facility] Liaison [phone]

## 2016-06-07 NOTE — Patient Outreach (Signed)
[facility]) Care Management  06/07/16  KIRILL CHATTERJEE 1980/06/15 [id number]  Patient readmitted to [facility] with sickle cell crisis on 06/06/2016.  [place] R. Jamonta Goerner, RN, BSN, [place] Management Coordinator [phone]

## 2016-06-08 DIAGNOSIS — G894 Chronic pain syndrome: Secondary | ICD-10-CM | POA: Diagnosis not present

## 2016-06-08 DIAGNOSIS — Z818 Family history of other mental and behavioral disorders: Secondary | ICD-10-CM | POA: Diagnosis not present

## 2016-06-08 DIAGNOSIS — Z833 Family history of diabetes mellitus: Secondary | ICD-10-CM | POA: Diagnosis not present

## 2016-06-08 DIAGNOSIS — D5700 Hb-SS disease with crisis, unspecified: Secondary | ICD-10-CM | POA: Diagnosis not present

## 2016-06-08 DIAGNOSIS — D638 Anemia in other chronic diseases classified elsewhere: Secondary | ICD-10-CM | POA: Diagnosis not present

## 2016-06-08 DIAGNOSIS — N179 Acute kidney failure, unspecified: Secondary | ICD-10-CM | POA: Diagnosis not present

## 2016-06-08 DIAGNOSIS — J9611 Chronic respiratory failure with hypoxia: Secondary | ICD-10-CM | POA: Diagnosis not present

## 2016-06-08 DIAGNOSIS — Z86711 Personal history of pulmonary embolism: Secondary | ICD-10-CM | POA: Diagnosis not present

## 2016-06-08 DIAGNOSIS — Z7901 Long term (current) use of anticoagulants: Secondary | ICD-10-CM | POA: Diagnosis not present

## 2016-06-08 LAB — CBC WITH DIFFERENTIAL/PLATELET
Basophils Absolute: 0.1 10*3/uL (ref 0.0–0.1)
Basophils Relative: 1 %
EOS PCT: 4 %
Eosinophils Absolute: 0.5 10*3/uL (ref 0.0–0.7)
HCT: 25.4 % — ABNORMAL LOW (ref 39.0–52.0)
Hemoglobin: 8.2 g/dL — ABNORMAL LOW (ref 13.0–17.0)
LYMPHS PCT: 12 %
Lymphs Abs: 1.5 10*3/uL (ref 0.7–4.0)
MCH: 29 pg (ref 26.0–34.0)
MCHC: 32.3 g/dL (ref 30.0–36.0)
MCV: 89.8 fL (ref 78.0–100.0)
MONO ABS: 2.1 10*3/uL — AB (ref 0.1–1.0)
Monocytes Relative: 16 %
Neutro Abs: 8.7 10*3/uL — ABNORMAL HIGH (ref 1.7–7.7)
Neutrophils Relative %: 67 %
Platelets: 518 10*3/uL — ABNORMAL HIGH (ref 150–400)
RBC: 2.83 MIL/uL — AB (ref 4.22–5.81)
RDW: 20.9 % — AB (ref 11.5–15.5)
WBC: 12.9 10*3/uL — ABNORMAL HIGH (ref 4.0–10.5)

## 2016-06-08 LAB — RETICULOCYTES
RBC.: 2.83 MIL/uL — AB (ref 4.22–5.81)
RETIC COUNT ABSOLUTE: 331.1 10*3/uL — AB (ref 19.0–186.0)
RETIC CT PCT: 11.7 % — AB (ref 0.4–3.1)

## 2016-06-08 MED ORDER — TORSEMIDE 20 MG PO TABS
20.0000 mg | ORAL_TABLET | Freq: Every day | ORAL | Status: DC
  Administered 2016-06-09: 20 mg via ORAL
  Filled 2016-06-08 (×2): qty 1

## 2016-06-08 MED ORDER — VITAMINS A & D EX OINT
TOPICAL_OINTMENT | CUTANEOUS | Status: AC
  Administered 2016-06-08: 1
  Filled 2016-06-08: qty 5

## 2016-06-08 NOTE — Progress Notes (Addendum)
SICKLE CELL SERVICE PROGRESS NOTE  [name] [id number]:[id number] DOB: Jun 07, 1980 DOA: 06/06/2016 PCP: Tresa Garter, MD  Assessment/Plan: Principal Problem:   Sickle cell pain crisis ([place]) Active Problems:   Pulmonary HTN (HCC)   Chronic anticoagulation   Chronic respiratory failure with hypoxia (HCC)   Anemia of chronic disease   Essential hypertension   Chronic pain syndrome  1. Hb SS with crisis: Continue PCA  at current dose. Toradol being held as he just last week had a 5 day course of Toradol.  Continue  IVF at [facility]. Schedule oral Dilaudid 2. Chronic Pain: Continue MS Contin. 3. PAH with chronic respiratory failure with hypoxia: At baseline. Pt scheduled for RBC Pheresis at [facility] on 06/13/2016 and with Pulmonology on 06/28/2016. 4. Anemia of Chronic Disease: Hb at baseline 5. Leukocytosis: WBC at baseline. 6. Chronic Anticoagulation: Continue Xarelto 7. H/O Pulmonary Edema: Pt had been on Torsemide however this was discontinued and is to be taken only as needed. Pt's weight at the time of discharge from [facility] was 77.6 kg. Weight today is 84.7 kg. Will continue Torsemide for now.  8. Secondary Hemochromatosis: Pt was resumed on Jadenu but has not been taking it on the advice of his mother.     Code Status: Full Code Family Communication: N/A Disposition Plan: Not yet ready for discharge  [place].  Pager [phone]. If 7PM-7AM, please contact night-coverage.  06/08/2016, 4:08 PM  LOS: 2 days   Interim History: Pt admits that he has been using his MS Contin in greater doses than prescribed when pain is intense. Today his pain is an intensity of 6/10 and localized to ribs, low back and legs. He has used 31.79 mg with 52/52:demands/deliveries in the last 24 hours.  Consultants:  None  Procedures:  None  Antibiotics:  None   Objective: Vitals:   06/08/16 1235 06/08/16 1536 06/08/16 1547 06/08/16 1600  BP:  120/86    Pulse:  90    Resp: (!) 22 18 (!)  21   Temp:  98.3 F (36.8 C)    TempSrc:  Oral    SpO2: 93% 93% 94%   Weight:    84.7 kg (186 lb 12.8 oz)  Height:       Weight change: -0.096 kg (-3.4 oz)  Intake/Output Summary (Last 24 hours) at 06/08/16 1608 Last data filed at 06/08/16 1538  Gross per 24 hour  Intake             1190 ml  Output             3825 ml  Net            -2635 ml     Physical Exam General: Alert, awake, oriented x3, in no acute distress.  Neck: Trachea midline,  no masses, no thyromegal,y no JVD, no carotid bruit OROPHARYNX:  Moist, No exudate/ erythema/lesions.  Heart: Regular rate and rhythm, without murmurs, rubs, gallops, PMI non-displaced, no heaves or thrills on palpation.  Lungs: Clear to auscultation, no wheezing or rhonchi noted. No increased vocal fremitus resonant to percussion  Abdomen: Soft, nontender, nondistended, positive bowel sounds, no masses no hepatosplenomegaly noted.  Neuro: No focal neurological deficits noted cranial nerves II through XII grossly intact.  Strength at functional baseline in bilateral upper and lower extremities. Musculoskeletal: No warmth swelling or erythema around joints, no spinal tenderness noted. Psychiatric: Patient alert and oriented x3, good insight and cognition, good recent to remote recall.     Data  Reviewed: Basic Metabolic Panel:  Recent Labs Lab 06/06/16 1752  NA 136  K 4.0  CL 108  CO2 21*  GLUCOSE 106*  BUN 6  CREATININE 0.68  CALCIUM 9.1   Liver Function Tests:  Recent Labs Lab 06/06/16 1752  AST 56*  ALT 44  ALKPHOS 125  BILITOT 4.7*  PROT 7.5  ALBUMIN 4.2    Recent Labs Lab 06/06/16 1752  LIPASE 21   No results for input(s): AMMONIA in the last 168 hours. CBC:  Recent Labs Lab 06/02/16 0648 06/04/16 0520 06/05/16 0500 06/06/16 1752 06/08/16 1300  WBC 12.3* 12.0* 10.7* 12.2* 12.9*  NEUTROABS 8.0* 7.6 6.2 9.1* 8.7*  HGB 7.0* 6.2* 7.0* 7.9* 8.2*  HCT 20.7* 18.3* 21.7* 24.3* 25.4*  MCV 89.6 89.3 92.3  88.0 89.8  PLT 594* 491* 518* 549* 518*   Cardiac Enzymes: No results for input(s): CKTOTAL, CKMB, CKMBINDEX, TROPONINI in the last 168 hours. BNP (last 3 results)  Recent Labs  04/21/16 1139 05/05/16 1206 05/16/16 1724  BNP 321.8* 639.0* 299.8*    ProBNP (last 3 results) No results for input(s): PROBNP in the last 8760 hours.  CBG: No results for input(s): GLUCAP in the last 168 hours.  No results found for this or any previous visit (from the past 240 hour(s)).   Studies: Dg Chest 2 View  Result Date: 06/06/2016 CLINICAL DATA:  Rib pain. Shortness of breath today. History of sickle cell anemia. EXAM: CHEST  2 VIEW COMPARISON:  Radiographs 05/28/2016, most recent chest CT 05/06/2016 FINDINGS: Left chest port remains in place. Cardiomegaly is similar to prior. Question of worsening vascular congestion versus differences in AP technique. No definite pleural effusion. No focal airspace disease. No pneumothorax. No acute rib abnormalities identified radiographically. IMPRESSION: Similar cardiomegaly. Question of worsening vascular congestion versus differences in technique. No new focal airspace disease. Electronically Signed   By: Jeb Levering M.D.   On: 06/06/2016 18:32   Dg Chest 2 View  Result Date: 05/28/2016 CLINICAL DATA:  36 year old male with sickle cell anemia presenting with bilateral rib pain. EXAM: CHEST  2 VIEW COMPARISON:  Chest radiograph dated 05/14/2016 FINDINGS: Left pectoral subclavian approach infusion catheter with tip over the right atrial silhouette adjacent to the cavoatrial junction. There is stable cardiomegaly with mild vascular congestion. No new consolidative changes. There is no pleural effusion or pneumothorax. No acute osseous pathology. IMPRESSION: 1. No focal consolidation. 2. Cardiomegaly with probable mild vascular congestion. Electronically Signed   By: Anner Crete M.D.   On: 05/28/2016 04:44   Dg Chest Port 1 View  Result Date:  05/14/2016 CLINICAL DATA:  Acute chest pain.  History of sickle cell anemia. EXAM: PORTABLE CHEST 1 VIEW COMPARISON:  04/29/2016 and prior exams FINDINGS: Cardiomegaly and mild vascular congestion noted. A left Port-A-Cath is present with tip overlying the superior cavoatrial junction. There is no evidence of focal airspace disease, pulmonary edema, suspicious pulmonary nodule/mass, pleural effusion, or pneumothorax. No acute bony abnormalities are identified. Left humeral head AVN again noted. IMPRESSION: Cardiomegaly and mild pulmonary vascular congestion. Electronically Signed   By: Margarette Canada M.D.   On: 05/14/2016 10:23    Scheduled Meds: . carvedilol  3.125 mg Oral BID WC  . folic acid  2 mg Oral Daily  . gabapentin  300 mg Oral TID  . HYDROmorphone   Intravenous Q4H  . hydroxyurea  1,000 mg Oral Q48H  . hydroxyurea  500 mg Oral Q48H  . L-glutamine  15 g Oral BID  .  lisinopril  2.5 mg Oral Daily  . morphine  30 mg Oral Q12H  . potassium chloride SA  40 mEq Oral Daily  . rivaroxaban  20 mg Oral Q supper  . senna-docusate  1 tablet Oral BID  . torsemide  20 mg Oral Daily   Continuous Infusions: . dextrose 5 % and 0.45% NaCl 10 mL/hr at 06/07/16 1625    Principal Problem:   Sickle cell pain crisis (HCC) Active Problems:   Pulmonary HTN (HCC)   Chronic anticoagulation   Chronic respiratory failure with hypoxia (HCC)   Anemia of chronic disease   Essential hypertension   Chronic pain syndrome    In excess of 25 minutes spent during this visit. Greater than 50% involved face to face contact with the patient for assessment, counseling and coordination of care.

## 2016-06-09 DIAGNOSIS — Z818 Family history of other mental and behavioral disorders: Secondary | ICD-10-CM | POA: Diagnosis not present

## 2016-06-09 DIAGNOSIS — Z7901 Long term (current) use of anticoagulants: Secondary | ICD-10-CM | POA: Diagnosis not present

## 2016-06-09 DIAGNOSIS — J9611 Chronic respiratory failure with hypoxia: Secondary | ICD-10-CM | POA: Diagnosis not present

## 2016-06-09 DIAGNOSIS — Z86711 Personal history of pulmonary embolism: Secondary | ICD-10-CM | POA: Diagnosis not present

## 2016-06-09 DIAGNOSIS — D5700 Hb-SS disease with crisis, unspecified: Secondary | ICD-10-CM | POA: Diagnosis not present

## 2016-06-09 DIAGNOSIS — N179 Acute kidney failure, unspecified: Secondary | ICD-10-CM | POA: Diagnosis not present

## 2016-06-09 DIAGNOSIS — G894 Chronic pain syndrome: Secondary | ICD-10-CM | POA: Diagnosis not present

## 2016-06-09 DIAGNOSIS — Z833 Family history of diabetes mellitus: Secondary | ICD-10-CM | POA: Diagnosis not present

## 2016-06-09 DIAGNOSIS — D638 Anemia in other chronic diseases classified elsewhere: Secondary | ICD-10-CM | POA: Diagnosis not present

## 2016-06-09 LAB — BASIC METABOLIC PANEL
ANION GAP: 9 (ref 5–15)
BUN: 30 mg/dL — AB (ref 6–20)
CO2: 24 mmol/L (ref 22–32)
Calcium: 8.8 mg/dL — ABNORMAL LOW (ref 8.9–10.3)
Chloride: 103 mmol/L (ref 101–111)
Creatinine, Ser: 1.52 mg/dL — ABNORMAL HIGH (ref 0.61–1.24)
GFR calc Af Amer: >60 mL/min (ref >60)
GFR, EST NON AFRICAN AMERICAN: 57 mL/min — AB (ref >60)
Glucose, Bld: 144 mg/dL — ABNORMAL HIGH (ref 65–99)
POTASSIUM: 4.6 mmol/L (ref 3.5–5.1)
Sodium: 136 mmol/L (ref 135–145)

## 2016-06-09 MED ORDER — SODIUM CHLORIDE 0.9 % IV SOLN
INTRAVENOUS | Status: DC
  Administered 2016-06-09 – 2016-06-11 (×5): via INTRAVENOUS
  Administered 2016-06-12 (×2): 1000 mL via INTRAVENOUS

## 2016-06-09 MED ORDER — METHOCARBAMOL 500 MG PO TABS
750.0000 mg | ORAL_TABLET | Freq: Three times a day (TID) | ORAL | Status: DC
  Administered 2016-06-09 – 2016-06-13 (×13): 750 mg via ORAL
  Filled 2016-06-09 (×14): qty 2

## 2016-06-09 NOTE — Progress Notes (Signed)
SICKLE CELL SERVICE PROGRESS NOTE  [name] GEX:[id number] DOB: December 30, 1980 DOA: 06/06/2016 PCP: Tresa Garter, MD  Assessment/Plan: Principal Problem:   Sickle cell pain crisis ([place]) Active Problems:   Pulmonary HTN (HCC)   Chronic anticoagulation   Chronic respiratory failure with hypoxia (HCC)   Anemia of chronic disease   Essential hypertension   Chronic pain syndrome   1. Acute Kidney Injury: Will hold Toradol and increase IVF to 100 ml/hr of 0.9 NS 2. Hb SS with Crisis: Continue PCA at current dose. Toradol held due to AKI. Will add Robaxin for muscle spasms. 3. PAH with chronic Respiratory failure: 4. Leukocytosis: No evidence of infection 5. Chronic Anticoagulation: Continue Eliquis 6. H/O Pulmonary Edema: Pt has had pulmonary edema in the past and was on Torsemide. However it was changed to PRN dosing during his last admission to [facility] but unfortunately he has been taking medication daily at home. I will hold Torsemide even though he his not at dry weight in light of AKI.  7. Anemia of Chronic Disease: Last Hb reflects stable anemia. Continue Hydrea. Recheck tomorrow.  8. Secondary Hemochromatosis: Pt refused Jadenu on the advice of his mother.   Code Status: Full Code Family Communication: N/A Disposition Plan: Not yet ready for discharge  [place].  Pager [phone]. If 7PM-7AM, please contact night-coverage.  06/09/2016, 12:50 PM  LOS: 3 days   Interim History: Pt reports pain 6/10 and localized to low back and less so to ribs. He has used 43.2 mg of Dilaudid via the PCA with 74/72:demands/deliveries in the last 24 hours. Lat BM yesterday.   Consultants:  None  Procedures:  None  Antibiotics:  None   Objective: Vitals:   06/09/16 0539 06/09/16 0819 06/09/16 1026 06/09/16 1154  BP: 109/63  (!) 111/17   Pulse: 88  89   Resp: 14 14 (!) 22 16  Temp: 98 F (36.7 C)  98.1 F (36.7 C)   TempSrc: Oral  Oral   SpO2: 96% 92% 93% 94%   Weight:      Height:       Weight change: -4.168 kg (-9 lb 3 oz)  Intake/Output Summary (Last 24 hours) at 06/09/16 1250 Last data filed at 06/09/16 0600  Gross per 24 hour  Intake            786.5 ml  Output             1550 ml  Net           -763.5 ml     Physical Exam General: Alert, awake, oriented x3, in no acute distress.  HEENT: /AT PEERL, EOMI, anicteric Neck: Trachea midline,  no masses, no thyromegal,y no JVD, no carotid bruit OROPHARYNX:  Moist, No exudate/ erythema/lesions.  Heart: Regular rate and rhythm, without murmurs, rubs, gallops, PMI non-displaced, no heaves or thrills on palpation.  Lungs: Clear to auscultation, no wheezing or rhonchi noted. No increased vocal fremitus resonant to percussion.   Abdomen: Soft, nontender, nondistended, positive bowel sounds, no masses no hepatosplenomegaly noted.  Neuro: No focal neurological deficits noted cranial nerves II through XII grossly intact.  Strength at functional baseline in bilateral upper and lower extremities. Musculoskeletal: No warmth swelling or erythema around joints, no spinal tenderness noted however spasms noted at the lumbar paraspinal muscles. Psychiatric: Patient alert and oriented x3, good insight and cognition, good recent to remote recall.     Data Reviewed: Basic Metabolic Panel:  Recent Labs Lab 06/06/16 1752 06/09/16  0500  NA 136 136  K 4.0 4.6  CL 108 103  CO2 21* 24  GLUCOSE 106* 144*  BUN 6 30*  CREATININE 0.68 1.52*  CALCIUM 9.1 8.8*   Liver Function Tests:  Recent Labs Lab 06/06/16 1752  AST 56*  ALT 44  ALKPHOS 125  BILITOT 4.7*  PROT 7.5  ALBUMIN 4.2    Recent Labs Lab 06/06/16 1752  LIPASE 21   No results for input(s): AMMONIA in the last 168 hours. CBC:  Recent Labs Lab 06/04/16 0520 06/05/16 0500 06/06/16 1752 06/08/16 1300  WBC 12.0* 10.7* 12.2* 12.9*  NEUTROABS 7.6 6.2 9.1* 8.7*  HGB 6.2* 7.0* 7.9* 8.2*  HCT 18.3* 21.7* 24.3* 25.4*  MCV 89.3  92.3 88.0 89.8  PLT 491* 518* 549* 518*   Cardiac Enzymes: No results for input(s): CKTOTAL, CKMB, CKMBINDEX, TROPONINI in the last 168 hours. BNP (last 3 results)  Recent Labs  04/21/16 1139 05/05/16 1206 05/16/16 1724  BNP 321.8* 639.0* 299.8*    ProBNP (last 3 results) No results for input(s): PROBNP in the last 8760 hours.  CBG: No results for input(s): GLUCAP in the last 168 hours.  No results found for this or any previous visit (from the past 240 hour(s)).   Studies: Dg Chest 2 View  Result Date: 06/06/2016 CLINICAL DATA:  Rib pain. Shortness of breath today. History of sickle cell anemia. EXAM: CHEST  2 VIEW COMPARISON:  Radiographs 05/28/2016, most recent chest CT 05/06/2016 FINDINGS: Left chest port remains in place. Cardiomegaly is similar to prior. Question of worsening vascular congestion versus differences in AP technique. No definite pleural effusion. No focal airspace disease. No pneumothorax. No acute rib abnormalities identified radiographically. IMPRESSION: Similar cardiomegaly. Question of worsening vascular congestion versus differences in technique. No new focal airspace disease. Electronically Signed   By: Jeb Levering M.D.   On: 06/06/2016 18:32   Dg Chest 2 View  Result Date: 05/28/2016 CLINICAL DATA:  36 year old male with sickle cell anemia presenting with bilateral rib pain. EXAM: CHEST  2 VIEW COMPARISON:  Chest radiograph dated 05/14/2016 FINDINGS: Left pectoral subclavian approach infusion catheter with tip over the right atrial silhouette adjacent to the cavoatrial junction. There is stable cardiomegaly with mild vascular congestion. No new consolidative changes. There is no pleural effusion or pneumothorax. No acute osseous pathology. IMPRESSION: 1. No focal consolidation. 2. Cardiomegaly with probable mild vascular congestion. Electronically Signed   By: Anner Crete M.D.   On: 05/28/2016 04:44   Dg Chest Port 1 View  Result Date:  05/14/2016 CLINICAL DATA:  Acute chest pain.  History of sickle cell anemia. EXAM: PORTABLE CHEST 1 VIEW COMPARISON:  04/29/2016 and prior exams FINDINGS: Cardiomegaly and mild vascular congestion noted. A left Port-A-Cath is present with tip overlying the superior cavoatrial junction. There is no evidence of focal airspace disease, pulmonary edema, suspicious pulmonary nodule/mass, pleural effusion, or pneumothorax. No acute bony abnormalities are identified. Left humeral head AVN again noted. IMPRESSION: Cardiomegaly and mild pulmonary vascular congestion. Electronically Signed   By: Margarette Canada M.D.   On: 05/14/2016 10:23    Scheduled Meds: . carvedilol  3.125 mg Oral BID WC  . folic acid  2 mg Oral Daily  . gabapentin  300 mg Oral TID  . HYDROmorphone   Intravenous Q4H  . hydroxyurea  1,000 mg Oral Q48H  . hydroxyurea  500 mg Oral Q48H  . L-glutamine  15 g Oral BID  . lisinopril  2.5 mg Oral Daily  .  morphine  30 mg Oral Q12H  . potassium chloride SA  40 mEq Oral Daily  . rivaroxaban  20 mg Oral Q supper  . senna-docusate  1 tablet Oral BID   Continuous Infusions: . dextrose 5 % and 0.45% NaCl 10 mL/hr at 06/07/16 1625    Principal Problem:   Sickle cell pain crisis (HCC) Active Problems:   Pulmonary HTN (HCC)   Chronic anticoagulation   Chronic respiratory failure with hypoxia (HCC)   Anemia of chronic disease   Essential hypertension   Chronic pain syndrome   In excess of 25 minutes spent during this visit. Greater than 50% involved face to face contact with the patient for assessment, counseling and coordination of care.

## 2016-06-10 DIAGNOSIS — D5700 Hb-SS disease with crisis, unspecified: Secondary | ICD-10-CM | POA: Diagnosis not present

## 2016-06-10 DIAGNOSIS — N179 Acute kidney failure, unspecified: Secondary | ICD-10-CM | POA: Diagnosis not present

## 2016-06-10 DIAGNOSIS — Z818 Family history of other mental and behavioral disorders: Secondary | ICD-10-CM | POA: Diagnosis not present

## 2016-06-10 DIAGNOSIS — J9611 Chronic respiratory failure with hypoxia: Secondary | ICD-10-CM | POA: Diagnosis not present

## 2016-06-10 DIAGNOSIS — Z86711 Personal history of pulmonary embolism: Secondary | ICD-10-CM | POA: Diagnosis not present

## 2016-06-10 DIAGNOSIS — Z833 Family history of diabetes mellitus: Secondary | ICD-10-CM | POA: Diagnosis not present

## 2016-06-10 LAB — RETICULOCYTES
RBC.: 2.64 MIL/uL — AB (ref 4.22–5.81)
Retic Count, Absolute: 195.4 10*3/uL — ABNORMAL HIGH (ref 19.0–186.0)
Retic Ct Pct: 7.4 % — ABNORMAL HIGH (ref 0.4–3.1)

## 2016-06-10 LAB — CBC WITH DIFFERENTIAL/PLATELET
BLASTS: 0 %
Band Neutrophils: 1 %
Basophils Absolute: 0 10*3/uL (ref 0.0–0.1)
Basophils Relative: 0 %
Eosinophils Absolute: 0.1 10*3/uL (ref 0.0–0.7)
Eosinophils Relative: 1 %
HEMATOCRIT: 23.8 % — AB (ref 39.0–52.0)
HEMOGLOBIN: 7.9 g/dL — AB (ref 13.0–17.0)
LYMPHS PCT: 20 %
Lymphs Abs: 2.4 10*3/uL (ref 0.7–4.0)
MCH: 29.9 pg (ref 26.0–34.0)
MCHC: 33.2 g/dL (ref 30.0–36.0)
MCV: 90.2 fL (ref 78.0–100.0)
MONOS PCT: 11 %
Metamyelocytes Relative: 3 %
Monocytes Absolute: 1.3 10*3/uL — ABNORMAL HIGH (ref 0.1–1.0)
Myelocytes: 1 %
NEUTROS PCT: 63 %
NRBC: 0 /100{WBCs}
Neutro Abs: 8.2 10*3/uL — ABNORMAL HIGH (ref 1.7–7.7)
OTHER: 0 %
Platelets: 436 10*3/uL — ABNORMAL HIGH (ref 150–400)
Promyelocytes Absolute: 0 %
RBC: 2.64 MIL/uL — AB (ref 4.22–5.81)
RDW: 20.4 % — ABNORMAL HIGH (ref 11.5–15.5)
WBC: 12 10*3/uL — AB (ref 4.0–10.5)

## 2016-06-10 LAB — BASIC METABOLIC PANEL
ANION GAP: 6 (ref 5–15)
BUN: 36 mg/dL — ABNORMAL HIGH (ref 6–20)
CO2: 24 mmol/L (ref 22–32)
Calcium: 8.6 mg/dL — ABNORMAL LOW (ref 8.9–10.3)
Chloride: 108 mmol/L (ref 101–111)
Creatinine, Ser: 1.14 mg/dL (ref 0.61–1.24)
GLUCOSE: 132 mg/dL — AB (ref 65–99)
POTASSIUM: 4.7 mmol/L (ref 3.5–5.1)
SODIUM: 138 mmol/L (ref 135–145)

## 2016-06-10 NOTE — Care Management Important Message (Signed)
Important Message  Patient Details  Name: VERDIS KOVAL MRN: [id number] Date of Birth: 01/07/1981   Medicare Important Message Given:  Yes    Kerin Salen 06/10/2016, 11:09 AMImportant Message  Patient Details  Name: NICHOALS HEYDE MRN: [id number] Date of Birth: 28-May-1980   Medicare Important Message Given:  Yes    Kerin Salen 06/10/2016, 11:08 AM

## 2016-06-10 NOTE — Progress Notes (Signed)
SICKLE CELL SERVICE PROGRESS NOTE  [name] TDV:[id number] DOB: [birth year] DOA: 06/06/2016 PCP: Tresa Garter, MD  Assessment/Plan: Principal Problem:   Sickle cell pain crisis ([place]) Active Problems:   Pulmonary HTN (HCC)   Chronic anticoagulation   Essential hypertension   Chronic pain syndrome   Chronic respiratory failure with hypoxia (HCC)   Anemia of chronic disease   1. Acute Kidney Injury: Creatinine has improved to 1.14 from 1.5. Continue hydration and monitoring. 2. Hb SS with Crisis: Continue PCA at current dose. Toradol held due to AKI. Will continue Robaxin for muscle spasms. 3. PAH with chronic Respiratory failure: Continue oxygen and follow up. Patient has remained stable. 4. Leukocytosis: Probably due to [place]. No evidence of infection 5. Chronic Anticoagulation: Continue Eliquis 6. H/O Pulmonary Edema: Pt has had pulmonary edema in the past and was on Torsemide. However it was changed to PRN dosing during his last admission to [facility] but unfortunately he has been taking medication daily at home. This has led to ARF. Will re-instruct patient at DC. 7. Anemia of Chronic Disease: stable H/H. Continue Hydrea. Recheck tomorrow.  8. Secondary Hemochromatosis: Pt refused Jadenu on the advice of his mother.   Code Status: Full Code Family Communication: N/A Disposition Plan: Not yet ready for discharge  [facility]  Pager [phone]. If 7PM-7AM, please contact night-coverage.  06/10/2016, 7:09 PM  LOS: 4 days   Interim History: Pt reports pain 5/10 and localized to low back and less so to ribs. He has used 39 mg of Dilaudid via the PCA with 70/68:demands/deliveries in the last 24 hours.  Consultants:  None  Procedures:  None  Antibiotics:  None   Objective: Vitals:   06/10/16 0811 06/10/16 1257 06/10/16 1500 06/10/16 1643  BP:   113/82   Pulse:   80   Resp: 13 14 16 14   Temp:   98.8 F (37.1 C)   TempSrc:   Oral   SpO2: 98% 96% 94% 94%   Weight:      Height:       Weight change: 6.033 kg (13 lb 4.8 oz)  Intake/Output Summary (Last 24 hours) at 06/10/16 1909 Last data filed at 06/10/16 1500  Gross per 24 hour  Intake          2436.67 ml  Output             3875 ml  Net         -1438.33 ml     Physical Exam General: Alert, awake, oriented x3, in no acute distress.  HEENT: [place]/AT PEERL, EOMI, anicteric Neck: Trachea midline,  no masses, no thyromegal,y no JVD, no carotid bruit OROPHARYNX:  Moist, No exudate/ erythema/lesions.  Heart: Regular rate and rhythm, without murmurs, rubs, gallops, PMI non-displaced, no heaves or thrills on palpation.  Lungs: Clear to auscultation, no wheezing or rhonchi noted. No increased vocal fremitus resonant to percussion.   Abdomen: Soft, nontender, nondistended, positive bowel sounds, no masses no hepatosplenomegaly noted.  Neuro: No focal neurological deficits noted cranial nerves II through XII grossly intact.  Strength at functional baseline in bilateral upper and lower extremities. Musculoskeletal: No warmth swelling or erythema around joints, no spinal tenderness noted however spasms noted at the lumbar paraspinal muscles. Psychiatric: Patient alert and oriented x3, good insight and cognition, good recent to remote recall.     Data Reviewed: Basic Metabolic Panel:  Recent Labs Lab 06/06/16 1752 06/09/16 0500 06/10/16 0442  NA 136 136 138  K 4.0 4.6  4.7  CL 108 103 108  CO2 21* 24 24  GLUCOSE 106* 144* 132*  BUN 6 30* 36*  CREATININE 0.68 1.52* 1.14  CALCIUM 9.1 8.8* 8.6*   Liver Function Tests:  Recent Labs Lab 06/06/16 1752  AST 56*  ALT 44  ALKPHOS 125  BILITOT 4.7*  PROT 7.5  ALBUMIN 4.2    Recent Labs Lab 06/06/16 1752  LIPASE 21   No results for input(s): AMMONIA in the last 168 hours. CBC:  Recent Labs Lab 06/04/16 0520 06/05/16 0500 06/06/16 1752 06/08/16 1300 06/10/16 0442  WBC 12.0* 10.7* 12.2* 12.9* 12.0*  NEUTROABS 7.6 6.2 9.1*  8.7* 8.2*  HGB 6.2* 7.0* 7.9* 8.2* 7.9*  HCT 18.3* 21.7* 24.3* 25.4* 23.8*  MCV 89.3 92.3 88.0 89.8 90.2  PLT 491* 518* 549* 518* 436*   Cardiac Enzymes: No results for input(s): CKTOTAL, CKMB, CKMBINDEX, TROPONINI in the last 168 hours. BNP (last 3 results)  Recent Labs  04/21/16 1139 05/05/16 1206 05/16/16 1724  BNP 321.8* 639.0* 299.8*    ProBNP (last 3 results) No results for input(s): PROBNP in the last 8760 hours.  CBG: No results for input(s): GLUCAP in the last 168 hours.  No results found for this or any previous visit (from the past 240 hour(s)).   Studies: Dg Chest 2 View  Result Date: 06/06/2016 CLINICAL DATA:  Rib pain. Shortness of breath today. History of sickle cell anemia. EXAM: CHEST  2 VIEW COMPARISON:  Radiographs 05/28/2016, most recent chest CT 05/06/2016 FINDINGS: Left chest port remains in place. Cardiomegaly is similar to prior. Question of worsening vascular congestion versus differences in AP technique. No definite pleural effusion. No focal airspace disease. No pneumothorax. No acute rib abnormalities identified radiographically. IMPRESSION: Similar cardiomegaly. Question of worsening vascular congestion versus differences in technique. No new focal airspace disease. Electronically Signed   By: Jeb Levering M.D.   On: 06/06/2016 18:32   Dg Chest 2 View  Result Date: 05/28/2016 CLINICAL DATA:  36 year old male with sickle cell anemia presenting with bilateral rib pain. EXAM: CHEST  2 VIEW COMPARISON:  Chest radiograph dated 05/14/2016 FINDINGS: Left pectoral subclavian approach infusion catheter with tip over the right atrial silhouette adjacent to the cavoatrial junction. There is stable cardiomegaly with mild vascular congestion. No new consolidative changes. There is no pleural effusion or pneumothorax. No acute osseous pathology. IMPRESSION: 1. No focal consolidation. 2. Cardiomegaly with probable mild vascular congestion. Electronically Signed   By:  Anner Crete M.D.   On: 05/28/2016 04:44   Dg Chest Port 1 View  Result Date: 05/14/2016 CLINICAL DATA:  Acute chest pain.  History of sickle cell anemia. EXAM: PORTABLE CHEST 1 VIEW COMPARISON:  04/29/2016 and prior exams FINDINGS: Cardiomegaly and mild vascular congestion noted. A left Port-A-Cath is present with tip overlying the superior cavoatrial junction. There is no evidence of focal airspace disease, pulmonary edema, suspicious pulmonary nodule/mass, pleural effusion, or pneumothorax. No acute bony abnormalities are identified. Left humeral head AVN again noted. IMPRESSION: Cardiomegaly and mild pulmonary vascular congestion. Electronically Signed   By: Margarette Canada M.D.   On: 05/14/2016 10:23    Scheduled Meds: . carvedilol  3.125 mg Oral BID WC  . folic acid  2 mg Oral Daily  . gabapentin  300 mg Oral TID  . HYDROmorphone   Intravenous Q4H  . hydroxyurea  1,000 mg Oral Q48H  . hydroxyurea  500 mg Oral Q48H  . L-glutamine  15 g Oral BID  . lisinopril  2.5 mg Oral Daily  . methocarbamol  750 mg Oral TID  . morphine  30 mg Oral Q12H  . potassium chloride SA  40 mEq Oral Daily  . rivaroxaban  20 mg Oral Q supper  . senna-docusate  1 tablet Oral BID   Continuous Infusions: . sodium chloride 100 mL/hr at 06/10/16 1907    Principal Problem:   Sickle cell pain crisis (HCC) Active Problems:   Pulmonary HTN (HCC)   Chronic anticoagulation   Essential hypertension   Chronic pain syndrome   Chronic respiratory failure with hypoxia (HCC)   Anemia of chronic disease   In excess of 25 minutes spent during this visit. Greater than 50% involved face to face contact with the patient for assessment, counseling and coordination of care.

## 2016-06-11 DIAGNOSIS — Z86711 Personal history of pulmonary embolism: Secondary | ICD-10-CM | POA: Diagnosis not present

## 2016-06-11 DIAGNOSIS — N179 Acute kidney failure, unspecified: Secondary | ICD-10-CM | POA: Diagnosis not present

## 2016-06-11 DIAGNOSIS — Z833 Family history of diabetes mellitus: Secondary | ICD-10-CM | POA: Diagnosis not present

## 2016-06-11 DIAGNOSIS — D5700 Hb-SS disease with crisis, unspecified: Secondary | ICD-10-CM | POA: Diagnosis not present

## 2016-06-11 DIAGNOSIS — Z818 Family history of other mental and behavioral disorders: Secondary | ICD-10-CM | POA: Diagnosis not present

## 2016-06-11 DIAGNOSIS — J9611 Chronic respiratory failure with hypoxia: Secondary | ICD-10-CM | POA: Diagnosis not present

## 2016-06-11 LAB — CBC WITH DIFFERENTIAL/PLATELET
BASOS ABS: 0.1 10*3/uL (ref 0.0–0.1)
BASOS PCT: 1 %
EOS PCT: 6 %
Eosinophils Absolute: 0.6 10*3/uL (ref 0.0–0.7)
HEMATOCRIT: 23.1 % — AB (ref 39.0–52.0)
Hemoglobin: 7.7 g/dL — ABNORMAL LOW (ref 13.0–17.0)
LYMPHS PCT: 17 %
Lymphs Abs: 1.9 10*3/uL (ref 0.7–4.0)
MCH: 30.1 pg (ref 26.0–34.0)
MCHC: 33.3 g/dL (ref 30.0–36.0)
MCV: 90.2 fL (ref 78.0–100.0)
MONO ABS: 2.2 10*3/uL — AB (ref 0.1–1.0)
Monocytes Relative: 20 %
NEUTROS ABS: 6.6 10*3/uL (ref 1.7–7.7)
Neutrophils Relative %: 58 %
PLATELETS: 424 10*3/uL — AB (ref 150–400)
RBC: 2.56 MIL/uL — ABNORMAL LOW (ref 4.22–5.81)
RDW: 20.5 % — AB (ref 11.5–15.5)
WBC: 11.3 10*3/uL — AB (ref 4.0–10.5)

## 2016-06-11 LAB — COMPREHENSIVE METABOLIC PANEL
ALBUMIN: 3.8 g/dL (ref 3.5–5.0)
ALT: 33 U/L (ref 17–63)
ANION GAP: 3 — AB (ref 5–15)
AST: 44 U/L — AB (ref 15–41)
Alkaline Phosphatase: 116 U/L (ref 38–126)
BILIRUBIN TOTAL: 1.9 mg/dL — AB (ref 0.3–1.2)
BUN: 17 mg/dL (ref 6–20)
CHLORIDE: 111 mmol/L (ref 101–111)
CO2: 24 mmol/L (ref 22–32)
Calcium: 8.6 mg/dL — ABNORMAL LOW (ref 8.9–10.3)
Creatinine, Ser: 0.75 mg/dL (ref 0.61–1.24)
GFR calc Af Amer: >60 mL/min (ref >60)
GFR calc non Af Amer: >60 mL/min (ref >60)
GLUCOSE: 120 mg/dL — AB (ref 65–99)
POTASSIUM: 4.6 mmol/L (ref 3.5–5.1)
SODIUM: 138 mmol/L (ref 135–145)
TOTAL PROTEIN: 7.1 g/dL (ref 6.5–8.1)

## 2016-06-11 MED ORDER — HYDROMORPHONE HCL 1 MG/ML IJ SOLN
2.0000 mg | INTRAMUSCULAR | Status: DC | PRN
  Administered 2016-06-11 – 2016-06-12 (×14): 2 mg via INTRAVENOUS
  Filled 2016-06-11 (×14): qty 2

## 2016-06-11 MED ORDER — HEPARIN SOD (PORK) LOCK FLUSH 100 UNIT/ML IV SOLN
500.0000 [IU] | Freq: Once | INTRAVENOUS | Status: DC
  Filled 2016-06-11: qty 5

## 2016-06-11 NOTE — Progress Notes (Signed)
SICKLE CELL SERVICE PROGRESS NOTE  [name]:[id number] DOB: 1980-09-13 DOA: 06/06/2016 PCP: Tresa Garter, MD  Assessment/Plan: Principal Problem:   Sickle cell pain crisis ([place]) Active Problems:   Pulmonary HTN (HCC)   Chronic anticoagulation   Essential hypertension   Chronic pain syndrome   Chronic respiratory failure with hypoxia (HCC)   Anemia of chronic disease   1. Acute Kidney Injury: Creatinine has normalized at 0.75. Decrease IV fluids and continue monitoring 2. Hb SS with Crisis: Continue PCA at current dose. Toradol held due to AKI. Will continue Robaxin for muscle spasms. Add physician assisted dosing limited. 3. PAH with chronic Respiratory failure: Continue oxygen and follow up. Patient has remained stable. 4. Leukocytosis: Improving daily. Probably due to [place]. No evidence of infection 5. Chronic Anticoagulation: Continue Eliquis 6. H/O Pulmonary Edema: Pt has had pulmonary edema in the past and was on Torsemide. However it was changed to PRN dosing during his last admission to [facility] but unfortunately he has been taking medication daily at home. This has led to ARF. Will re-instruct patient at DC. 7. Anemia of Chronic Disease: stable H/H. Continue Hydrea. Recheck tomorrow.  8. Secondary Hemochromatosis: Pt refused Jadenu on the advice of his mother.   Code Status: Full Code Family Communication: N/A Disposition Plan: Not yet ready for discharge  [facility]  Pager [phone]. If 7PM-7AM, please contact night-coverage.  06/11/2016, 12:09 PM  LOS: 5 days   Interim History: Pt reports pain 5/10 and localized to low back and less so to ribs. He has used 39 mg of Dilaudid via the PCA with 70/68:demands/deliveries in the last 24 hours.  Consultants:  None  Procedures:  None  Antibiotics:  None   Objective: Vitals:   06/11/16 0524 06/11/16 0811 06/11/16 0814 06/11/16 1034  BP: 109/70 106/66  109/62  Pulse: 85 81  79  Resp: 14  16 15    Temp: 98.4 F (36.9 C)   98.6 F (37 C)  TempSrc: Oral   Oral  SpO2: 93%  93% 95%  Weight: 89.6 kg (197 lb 9.6 oz)     Height:       Weight change: -1.134 kg (-2 lb 8 oz)  Intake/Output Summary (Last 24 hours) at 06/11/16 1209 Last data filed at 06/11/16 0930  Gross per 24 hour  Intake              640 ml  Output             3800 ml  Net            -3160 ml     Physical Exam General: Alert, awake, oriented x3, in no acute distress.  HEENT: [place]/AT PEERL, EOMI, anicteric Neck: Trachea midline,  no masses, no thyromegal,y no JVD, no carotid bruit OROPHARYNX:  Moist, No exudate/ erythema/lesions.  Heart: Regular rate and rhythm, without murmurs, rubs, gallops, PMI non-displaced, no heaves or thrills on palpation.  Lungs: Clear to auscultation, no wheezing or rhonchi noted. No increased vocal fremitus resonant to percussion.   Abdomen: Soft, nontender, nondistended, positive bowel sounds, no masses no hepatosplenomegaly noted.  Neuro: No focal neurological deficits noted cranial nerves II through XII grossly intact.  Strength at functional baseline in bilateral upper and lower extremities. Musculoskeletal: No warmth swelling or erythema around joints, no spinal tenderness noted however spasms noted at the lumbar paraspinal muscles. Psychiatric: Patient alert and oriented x3, good insight and cognition, good recent to remote recall.     Data Reviewed:  Basic Metabolic Panel:  Recent Labs Lab 06/06/16 1752 06/09/16 0500 06/10/16 0442 06/11/16 0830  NA 136 136 138 138  K 4.0 4.6 4.7 4.6  CL 108 103 108 111  CO2 21* 24 24 24   GLUCOSE 106* 144* 132* 120*  BUN 6 30* 36* 17  CREATININE 0.68 1.52* 1.14 0.75  CALCIUM 9.1 8.8* 8.6* 8.6*   Liver Function Tests:  Recent Labs Lab 06/06/16 1752 06/11/16 0830  AST 56* 44*  ALT 44 33  ALKPHOS 125 116  BILITOT 4.7* 1.9*  PROT 7.5 7.1  ALBUMIN 4.2 3.8    Recent Labs Lab 06/06/16 1752  LIPASE 21   No results for  input(s): AMMONIA in the last 168 hours. CBC:  Recent Labs Lab 06/05/16 0500 06/06/16 1752 06/08/16 1300 06/10/16 0442 06/11/16 0830  WBC 10.7* 12.2* 12.9* 12.0* 11.3*  NEUTROABS 6.2 9.1* 8.7* 8.2* 6.6  HGB 7.0* 7.9* 8.2* 7.9* 7.7*  HCT 21.7* 24.3* 25.4* 23.8* 23.1*  MCV 92.3 88.0 89.8 90.2 90.2  PLT 518* 549* 518* 436* 424*   Cardiac Enzymes: No results for input(s): CKTOTAL, CKMB, CKMBINDEX, TROPONINI in the last 168 hours. BNP (last 3 results)  Recent Labs  04/21/16 1139 05/05/16 1206 05/16/16 1724  BNP 321.8* 639.0* 299.8*    ProBNP (last 3 results) No results for input(s): PROBNP in the last 8760 hours.  CBG: No results for input(s): GLUCAP in the last 168 hours.  No results found for this or any previous visit (from the past 240 hour(s)).   Studies: Dg Chest 2 View  Result Date: 06/06/2016 CLINICAL DATA:  Rib pain. Shortness of breath today. History of sickle cell anemia. EXAM: CHEST  2 VIEW COMPARISON:  Radiographs 05/28/2016, most recent chest CT 05/06/2016 FINDINGS: Left chest port remains in place. Cardiomegaly is similar to prior. Question of worsening vascular congestion versus differences in AP technique. No definite pleural effusion. No focal airspace disease. No pneumothorax. No acute rib abnormalities identified radiographically. IMPRESSION: Similar cardiomegaly. Question of worsening vascular congestion versus differences in technique. No new focal airspace disease. Electronically Signed   By: Jeb Levering M.D.   On: 06/06/2016 18:32   Dg Chest 2 View  Result Date: 05/28/2016 CLINICAL DATA:  36 year old male with sickle cell anemia presenting with bilateral rib pain. EXAM: CHEST  2 VIEW COMPARISON:  Chest radiograph dated 05/14/2016 FINDINGS: Left pectoral subclavian approach infusion catheter with tip over the right atrial silhouette adjacent to the cavoatrial junction. There is stable cardiomegaly with mild vascular congestion. No new consolidative  changes. There is no pleural effusion or pneumothorax. No acute osseous pathology. IMPRESSION: 1. No focal consolidation. 2. Cardiomegaly with probable mild vascular congestion. Electronically Signed   By: Anner Crete M.D.   On: 05/28/2016 04:44   Dg Chest Port 1 View  Result Date: 05/14/2016 CLINICAL DATA:  Acute chest pain.  History of sickle cell anemia. EXAM: PORTABLE CHEST 1 VIEW COMPARISON:  04/29/2016 and prior exams FINDINGS: Cardiomegaly and mild vascular congestion noted. A left Port-A-Cath is present with tip overlying the superior cavoatrial junction. There is no evidence of focal airspace disease, pulmonary edema, suspicious pulmonary nodule/mass, pleural effusion, or pneumothorax. No acute bony abnormalities are identified. Left humeral head AVN again noted. IMPRESSION: Cardiomegaly and mild pulmonary vascular congestion. Electronically Signed   By: Margarette Canada M.D.   On: 05/14/2016 10:23    Scheduled Meds: . carvedilol  3.125 mg Oral BID WC  . folic acid  2 mg Oral Daily  .  gabapentin  300 mg Oral TID  . HYDROmorphone   Intravenous Q4H  . hydroxyurea  1,000 mg Oral Q48H  . hydroxyurea  500 mg Oral Q48H  . L-glutamine  15 g Oral BID  . lisinopril  2.5 mg Oral Daily  . methocarbamol  750 mg Oral TID  . morphine  30 mg Oral Q12H  . potassium chloride SA  40 mEq Oral Daily  . rivaroxaban  20 mg Oral Q supper  . senna-docusate  1 tablet Oral BID   Continuous Infusions: . sodium chloride 100 mL/hr at 06/11/16 0557    Principal Problem:   Sickle cell pain crisis (HCC) Active Problems:   Pulmonary HTN (HCC)   Chronic anticoagulation   Essential hypertension   Chronic pain syndrome   Chronic respiratory failure with hypoxia (HCC)   Anemia of chronic disease   In excess of 25 minutes spent during this visit. Greater than 50% involved face to face contact with the patient for assessment, counseling and coordination of care.

## 2016-06-12 DIAGNOSIS — Z833 Family history of diabetes mellitus: Secondary | ICD-10-CM | POA: Diagnosis not present

## 2016-06-12 DIAGNOSIS — Z86711 Personal history of pulmonary embolism: Secondary | ICD-10-CM | POA: Diagnosis not present

## 2016-06-12 DIAGNOSIS — N179 Acute kidney failure, unspecified: Secondary | ICD-10-CM | POA: Diagnosis not present

## 2016-06-12 DIAGNOSIS — J9611 Chronic respiratory failure with hypoxia: Secondary | ICD-10-CM | POA: Diagnosis not present

## 2016-06-12 DIAGNOSIS — Z818 Family history of other mental and behavioral disorders: Secondary | ICD-10-CM | POA: Diagnosis not present

## 2016-06-12 DIAGNOSIS — D5700 Hb-SS disease with crisis, unspecified: Secondary | ICD-10-CM | POA: Diagnosis not present

## 2016-06-12 MED ORDER — HYDROMORPHONE HCL 4 MG PO TABS
8.0000 mg | ORAL_TABLET | ORAL | Status: DC | PRN
  Administered 2016-06-12 – 2016-06-13 (×2): 8 mg via ORAL
  Filled 2016-06-12 (×2): qty 2

## 2016-06-12 NOTE — Progress Notes (Signed)
SICKLE CELL SERVICE PROGRESS NOTE  [name] WJX:[id number] DOB: 05/21/1980 DOA: 06/06/2016 PCP: Tresa Garter, MD  Assessment/Plan: Principal Problem:   Sickle cell pain crisis ([place]) Active Problems:   Pulmonary HTN (HCC)   Chronic anticoagulation   Essential hypertension   Chronic pain syndrome   Chronic respiratory failure with hypoxia (HCC)   Anemia of chronic disease   1. Acute Kidney Injury: Creatinine has normalized. DC IVF and monitor  2. Hb SS with Crisis: Continue PCA at current dose. Toradol held due to AKI. Will continue Robaxin for muscle spasms. DCphysician assisted dosing and initiate home short acting for breakthrough. 3. PAH with chronic Respiratory failure: Continue oxygen and follow up. Patient has remained stable. 4. Leukocytosis: Improving daily. Probably due to [place]. No evidence of infection. Recheck in am 5. Chronic Anticoagulation: Continue Eliquis 6. H/O Pulmonary Edema: On Torsemide every other day.Continue 7. Anemia of Chronic Disease: stable H/H. Continue Hydrea. Recheck tomorrow.  8. Secondary Hemochromatosis: Pt refused Jadenu on the advice of his mother.  Code Status: Full Code Family Communication: N/A Disposition Plan: Not yet ready for discharge  [facility]  Pager [phone]. If 7PM-7AM, please contact night-coverage.  06/12/2016, 6:46 PM  LOS: 6 days   Interim History: Pt reports pain 8/10 and localized to the right so to ribs. He has used 52 mg of Dilaudid via the PCA with 88/87:demands/deliveries in the last 24 hours.  Consultants:  None  Procedures:  None  Antibiotics:  None   Objective: Vitals:   06/12/16 1330 06/12/16 1600 06/12/16 1635 06/12/16 1804  BP: 109/79  112/67 110/90  Pulse: 80  82 88  Resp: 20 19  18   Temp: 98.3 F (36.8 C)   98.7 F (37.1 C)  TempSrc: Oral   Oral  SpO2: 91% 91%  91%  Weight:      Height:       Weight change: 1.089 kg (2 lb 6.4 oz)  Intake/Output Summary (Last 24 hours) at  06/12/16 1846 Last data filed at 06/12/16 1805  Gross per 24 hour  Intake          3886.67 ml  Output             4150 ml  Net          -263.33 ml     Physical Exam General: Alert, awake, oriented x3, in no acute distress.  HEENT: [place]/AT PEERL, EOMI, anicteric Neck: Trachea midline,  no masses, no thyromegal,y no JVD, no carotid bruit OROPHARYNX:  Moist, No exudate/ erythema/lesions.  Heart: Regular rate and rhythm, without murmurs, rubs, gallops, PMI non-displaced, no heaves or thrills on palpation.  Lungs: Clear to auscultation, no wheezing or rhonchi noted. No increased vocal fremitus resonant to percussion.   Abdomen: Soft, nontender, nondistended, positive bowel sounds, no masses no hepatosplenomegaly noted.  Neuro: No focal neurological deficits noted cranial nerves II through XII grossly intact.  Strength at functional baseline in bilateral upper and lower extremities. Musculoskeletal: No warmth swelling or erythema around joints, no spinal tenderness noted however spasms noted at the lumbar paraspinal muscles. Psychiatric: Patient alert and oriented x3, good insight and cognition, good recent to remote recall.     Data Reviewed: Basic Metabolic Panel:  Recent Labs Lab 06/06/16 1752 06/09/16 0500 06/10/16 0442 06/11/16 0830  NA 136 136 138 138  K 4.0 4.6 4.7 4.6  CL 108 103 108 111  CO2 21* 24 24 24   GLUCOSE 106* 144* 132* 120*  BUN 6 30*  36* 17  CREATININE 0.68 1.52* 1.14 0.75  CALCIUM 9.1 8.8* 8.6* 8.6*   Liver Function Tests:  Recent Labs Lab 06/06/16 1752 06/11/16 0830  AST 56* 44*  ALT 44 33  ALKPHOS 125 116  BILITOT 4.7* 1.9*  PROT 7.5 7.1  ALBUMIN 4.2 3.8    Recent Labs Lab 06/06/16 1752  LIPASE 21   No results for input(s): AMMONIA in the last 168 hours. CBC:  Recent Labs Lab 06/06/16 1752 06/08/16 1300 06/10/16 0442 06/11/16 0830  WBC 12.2* 12.9* 12.0* 11.3*  NEUTROABS 9.1* 8.7* 8.2* 6.6  HGB 7.9* 8.2* 7.9* 7.7*  HCT 24.3* 25.4*  23.8* 23.1*  MCV 88.0 89.8 90.2 90.2  PLT 549* 518* 436* 424*   Cardiac Enzymes: No results for input(s): CKTOTAL, CKMB, CKMBINDEX, TROPONINI in the last 168 hours. BNP (last 3 results)  Recent Labs  04/21/16 1139 05/05/16 1206 05/16/16 1724  BNP 321.8* 639.0* 299.8*    ProBNP (last 3 results) No results for input(s): PROBNP in the last 8760 hours.  CBG: No results for input(s): GLUCAP in the last 168 hours.  No results found for this or any previous visit (from the past 240 hour(s)).   Studies: Dg Chest 2 View  Result Date: 06/06/2016 CLINICAL DATA:  Rib pain. Shortness of breath today. History of sickle cell anemia. EXAM: CHEST  2 VIEW COMPARISON:  Radiographs 05/28/2016, most recent chest CT 05/06/2016 FINDINGS: Left chest port remains in place. Cardiomegaly is similar to prior. Question of worsening vascular congestion versus differences in AP technique. No definite pleural effusion. No focal airspace disease. No pneumothorax. No acute rib abnormalities identified radiographically. IMPRESSION: Similar cardiomegaly. Question of worsening vascular congestion versus differences in technique. No new focal airspace disease. Electronically Signed   By: Jeb Levering M.D.   On: 06/06/2016 18:32   Dg Chest 2 View  Result Date: 05/28/2016 CLINICAL DATA:  36 year old male with sickle cell anemia presenting with bilateral rib pain. EXAM: CHEST  2 VIEW COMPARISON:  Chest radiograph dated 05/14/2016 FINDINGS: Left pectoral subclavian approach infusion catheter with tip over the right atrial silhouette adjacent to the cavoatrial junction. There is stable cardiomegaly with mild vascular congestion. No new consolidative changes. There is no pleural effusion or pneumothorax. No acute osseous pathology. IMPRESSION: 1. No focal consolidation. 2. Cardiomegaly with probable mild vascular congestion. Electronically Signed   By: Anner Crete M.D.   On: 05/28/2016 04:44   Dg Chest Port 1  View  Result Date: 05/14/2016 CLINICAL DATA:  Acute chest pain.  History of sickle cell anemia. EXAM: PORTABLE CHEST 1 VIEW COMPARISON:  04/29/2016 and prior exams FINDINGS: Cardiomegaly and mild vascular congestion noted. A left Port-A-Cath is present with tip overlying the superior cavoatrial junction. There is no evidence of focal airspace disease, pulmonary edema, suspicious pulmonary nodule/mass, pleural effusion, or pneumothorax. No acute bony abnormalities are identified. Left humeral head AVN again noted. IMPRESSION: Cardiomegaly and mild pulmonary vascular congestion. Electronically Signed   By: Margarette Canada M.D.   On: 05/14/2016 10:23    Scheduled Meds: . carvedilol  3.125 mg Oral BID WC  . folic acid  2 mg Oral Daily  . gabapentin  300 mg Oral TID  . heparin lock flush  500 Units Intravenous Once  . HYDROmorphone   Intravenous Q4H  . hydroxyurea  1,000 mg Oral Q48H  . hydroxyurea  500 mg Oral Q48H  . L-glutamine  15 g Oral BID  . lisinopril  2.5 mg Oral Daily  .  methocarbamol  750 mg Oral TID  . morphine  30 mg Oral Q12H  . potassium chloride SA  40 mEq Oral Daily  . rivaroxaban  20 mg Oral Q supper  . senna-docusate  1 tablet Oral BID   Continuous Infusions: . sodium chloride 1,000 mL (06/12/16 1217)    Principal Problem:   Sickle cell pain crisis (HCC) Active Problems:   Pulmonary HTN (HCC)   Chronic anticoagulation   Essential hypertension   Chronic pain syndrome   Chronic respiratory failure with hypoxia (HCC)   Anemia of chronic disease   In excess of 25 minutes spent during this visit. Greater than 50% involved face to face contact with the patient for assessment, counseling and coordination of care.

## 2016-06-13 DIAGNOSIS — G894 Chronic pain syndrome: Secondary | ICD-10-CM | POA: Diagnosis not present

## 2016-06-13 DIAGNOSIS — Z818 Family history of other mental and behavioral disorders: Secondary | ICD-10-CM | POA: Diagnosis not present

## 2016-06-13 DIAGNOSIS — D638 Anemia in other chronic diseases classified elsewhere: Secondary | ICD-10-CM | POA: Diagnosis not present

## 2016-06-13 DIAGNOSIS — Z7901 Long term (current) use of anticoagulants: Secondary | ICD-10-CM | POA: Diagnosis not present

## 2016-06-13 DIAGNOSIS — Z833 Family history of diabetes mellitus: Secondary | ICD-10-CM | POA: Diagnosis not present

## 2016-06-13 DIAGNOSIS — I10 Essential (primary) hypertension: Secondary | ICD-10-CM

## 2016-06-13 DIAGNOSIS — D5700 Hb-SS disease with crisis, unspecified: Secondary | ICD-10-CM | POA: Diagnosis not present

## 2016-06-13 DIAGNOSIS — I272 Pulmonary hypertension, unspecified: Secondary | ICD-10-CM | POA: Diagnosis not present

## 2016-06-13 DIAGNOSIS — N179 Acute kidney failure, unspecified: Secondary | ICD-10-CM | POA: Diagnosis not present

## 2016-06-13 DIAGNOSIS — Z86711 Personal history of pulmonary embolism: Secondary | ICD-10-CM | POA: Diagnosis not present

## 2016-06-13 DIAGNOSIS — J9611 Chronic respiratory failure with hypoxia: Secondary | ICD-10-CM | POA: Diagnosis not present

## 2016-06-13 LAB — CBC WITH DIFFERENTIAL/PLATELET
BASOS ABS: 0.1 10*3/uL (ref 0.0–0.1)
BASOS PCT: 1 %
Eosinophils Absolute: 0.4 10*3/uL (ref 0.0–0.7)
Eosinophils Relative: 4 %
HEMATOCRIT: 22.4 % — AB (ref 39.0–52.0)
HEMOGLOBIN: 7.7 g/dL — AB (ref 13.0–17.0)
LYMPHS ABS: 1.8 10*3/uL (ref 0.7–4.0)
Lymphocytes Relative: 17 %
MCH: 30.1 pg (ref 26.0–34.0)
MCHC: 34.4 g/dL (ref 30.0–36.0)
MCV: 87.5 fL (ref 78.0–100.0)
Monocytes Absolute: 1.4 10*3/uL — ABNORMAL HIGH (ref 0.1–1.0)
Monocytes Relative: 14 %
NEUTROS PCT: 64 %
Neutro Abs: 6.6 10*3/uL (ref 1.7–7.7)
Platelets: 372 10*3/uL (ref 150–400)
RBC: 2.56 MIL/uL — AB (ref 4.22–5.81)
RDW: 20 % — AB (ref 11.5–15.5)
WBC: 10.3 10*3/uL (ref 4.0–10.5)
nRBC: 1 /100 WBC — ABNORMAL HIGH

## 2016-06-13 LAB — BASIC METABOLIC PANEL
ANION GAP: 6 (ref 5–15)
BUN: 15 mg/dL (ref 6–20)
CALCIUM: 9 mg/dL (ref 8.9–10.3)
CO2: 24 mmol/L (ref 22–32)
CREATININE: 0.69 mg/dL (ref 0.61–1.24)
Chloride: 107 mmol/L (ref 101–111)
GFR calc non Af Amer: >60 mL/min (ref >60)
Glucose, Bld: 110 mg/dL — ABNORMAL HIGH (ref 65–99)
Potassium: 4.4 mmol/L (ref 3.5–5.1)
SODIUM: 137 mmol/L (ref 135–145)

## 2016-06-13 LAB — RETICULOCYTES
RBC.: 2.56 MIL/uL — AB (ref 4.22–5.81)
RETIC COUNT ABSOLUTE: 89.6 10*3/uL (ref 19.0–186.0)
Retic Ct Pct: 3.5 % — ABNORMAL HIGH (ref 0.4–3.1)

## 2016-06-13 MED ORDER — TORSEMIDE 20 MG PO TABS: 20.0000 mg | ORAL_TABLET | Freq: Every day | ORAL | Status: AC | PRN

## 2016-06-13 NOTE — Discharge Summary (Signed)
[name] MRN: [id number] DOB/AGE: 05-Feb-1980 36 y.o.  Admit date: 06/06/2016 Discharge date: 06/13/2016  Primary Care Physician:  Tresa Garter, MD   Discharge Diagnoses:   Patient Active Problem List   Diagnosis Date Noted  . Cor pulmonale, chronic ([place]) 08/25/2014    Priority: High  . Pulmonary HTN ([place]) 06/18/2013    Priority: High  . Functional asplenia     Priority: High  . Anemia of chronic disease 06/25/2014    Priority: Medium  . Chronic respiratory failure with hypoxia ([place]) 03/14/2014    Priority: Medium  . Chronic anticoagulation 08/22/2013    Priority: Medium  . Avascular necrosis (HCC)     Priority: Low  . H/O arterial ischemic stroke 11/11/2015  . Slurred speech 11/10/2015  . Chronic anemia   . Hb-SS disease without crisis ([place]) 10/07/2014  . Prolonged Q-T interval on ECG 09/10/2014  . Troponin level elevated 08/29/2014  . Sickle cell anemia ([place]) 06/25/2014  . PAH (pulmonary artery hypertension) ([place]) 03/18/2014  . Paralytic strabismus, external ophthalmoplegia   . Chronic pain syndrome 12/12/2013  . Essential hypertension 08/22/2013  . Vitamin D deficiency 02/13/2013  . Hx of pulmonary embolus 06/29/2012  . Secondary hemochromatosis 12/14/2011    DISCHARGE MEDICATION: Allergies as of 06/13/2016   No Known Allergies     Medication List    STOP taking these medications   hydroxyurea 500 MG capsule Commonly known as:  HYDREA     TAKE these medications   carvedilol 3.125 MG tablet Commonly known as:  COREG Take 1 tablet (3.125 mg total) by mouth 2 (two) times daily with a meal.   cholecalciferol 1000 units tablet Commonly known as:  VITAMIN D Take 2,000 Units by mouth daily.   folic acid 1 MG tablet Commonly known as:  FOLVITE Take 2 tablets (2 mg total) by mouth daily.   gabapentin 300 MG capsule Commonly known as:  NEURONTIN Take 1 capsule (300 mg total) by mouth 3 (three) times daily.   HYDROmorphone 4 MG  tablet Commonly known as:  DILAUDID Take 2 tablets (8 mg total) by mouth every 4 (four) hours as needed for moderate pain or severe pain.   L-glutamine 5 g Pack Powder Packet Commonly known as:  ENDARI Take 15 g by mouth 2 (two) times daily.   lisinopril 2.5 MG tablet Commonly known as:  PRINIVIL,ZESTRIL Take 2.5 mg by mouth daily.   morphine 30 MG 12 hr tablet Commonly known as:  MS CONTIN Take 1 tablet (30 mg total) by mouth every 12 (twelve) hours.   potassium chloride SA 20 MEQ tablet Commonly known as:  K-DUR,KLOR-CON Take 2 tablets (40 mEq total) by mouth daily.   rivaroxaban 20 MG Tabs tablet Commonly known as:  XARELTO Take 1 tablet (20 mg total) by mouth at bedtime.   torsemide 20 MG tablet Commonly known as:  DEMADEX Take 1 tablet (20 mg total) by mouth daily as needed (leg swelling).   zolpidem 10 MG tablet Commonly known as:  AMBIEN Take 1 tablet (10 mg total) by mouth at bedtime as needed for sleep.         Consults:    SIGNIFICANT DIAGNOSTIC STUDIES:  Dg Chest 2 View  Result Date: 06/06/2016 CLINICAL DATA:  Rib pain. Shortness of breath today. History of sickle cell anemia. EXAM: CHEST  2 VIEW COMPARISON:  Radiographs 05/28/2016, most recent chest CT 05/06/2016 FINDINGS: Left chest port remains in place. Cardiomegaly is similar to prior. Question of worsening  vascular congestion versus differences in AP technique. No definite pleural effusion. No focal airspace disease. No pneumothorax. No acute rib abnormalities identified radiographically. IMPRESSION: Similar cardiomegaly. Question of worsening vascular congestion versus differences in technique. No new focal airspace disease. Electronically Signed   By: Jeb Levering M.D.   On: 06/06/2016 18:32   Dg Chest 2 View  Result Date: 05/28/2016 CLINICAL DATA:  36 year old male with sickle cell anemia presenting with bilateral rib pain. EXAM: CHEST  2 VIEW COMPARISON:  Chest radiograph dated 05/14/2016  FINDINGS: Left pectoral subclavian approach infusion catheter with tip over the right atrial silhouette adjacent to the cavoatrial junction. There is stable cardiomegaly with mild vascular congestion. No new consolidative changes. There is no pleural effusion or pneumothorax. No acute osseous pathology. IMPRESSION: 1. No focal consolidation. 2. Cardiomegaly with probable mild vascular congestion. Electronically Signed   By: Anner Crete M.D.   On: 05/28/2016 04:44       No results found for this or any previous visit (from the past 240 hour(s)).  BRIEF ADMITTING H & P: [name] is a 36 y.o. male with a past medical history significant for sickle cell disease and pulmonary hypertension on home O2, chronic PE on Xarelto and HTN who presents with complaints of sickle cell pain.  The patient was in his usual state of health until a few days ago he developed low back pain. This was constant, aching, and similar to his previous sickle cell crisis pain. It did not improve his home pain medicines. It was associated with some nausea and emesis, but no chest pain, shortness of breath, cough, dysuria.  ED course: -Afebrile, heart rate 70, respirations normal, pulse ox 88-92% on home 4L O2, BP 118/92 -Na 136, K 4.0, Cr 0.68, WBC 12.2K, Hgb 7.9 (baseline 7-8) -Lipase normal -CXR showed prominent vessels, no airspace disease -ECG showed old IVCD and left axis, no ST changes -He was given hydromorphone IV per protocol without appreciable improvement in pain, so [facility] were asked to admit for [facility] pain crisis   Hospital Course:  Present on Admission: . (Resolved) Sickle cell pain crisis ([employer]) . Pulmonary HTN ([place]) . Essential hypertension . Chronic pain syndrome . Chronic respiratory failure with hypoxia ([place]) . Anemia of chronic disease  Pt was admitted with sickle cell crisis likely caused by him no having his medication as he used more MS Contin  than prescribed. His pain was managed with  Dilaudid via PCA, Toradol was not given on admission as patient had been discharged only 2 days before and had at that time received 5 days of IV Toradol and would've  been at increased risk of kidney injury and GI bleeding. As pain improved, he was transitioned to oral analgesics and at the time of discharge although still complaining of pain, he is ambulatiory without difficulty.   Pt also had been taking Torsemide on a daily basis at home although his prescription had been changed to Torsemide PRN leg swelling. On admission he was continued on Torsemide daily however he sustained an Acute Kidney Injury and Torsemide was held and IVF hydration instituted. His kidney function recovered and at the time of discharge his BUN and Cr are within normal range.   Pt also has chronic respiratory failure and did not require any increased oxygen supplementation during hospitalization. He is discharged home on Oxygen 3 L/min ATC.  Pt has an anemia of chronic disease and his Hb is at baseline of 7.7 g/dL (baselien 7.8 g/dL  after last RBC Pheresis). He has his next RBC Pheresis scheduled for this Friday 06/17/2016. Hydrea is held at discharge as RPI is 0.9 with reticulocyte manual count of 89.0 and percentage of 3.5% in a setting of low Hct 22.4%. He has an appointment with hematology on Friday at which time he will have his labs reviewed and decisions made about resumption of Hydrea.   Pt also had a mild Leukocytosis but no evidence of infection. The :eukocytosis was felt to be due to crisis. At the time of discharge leukocytosis had resolved.    Disposition and Follow-up: Pt has an appointment for follow up with Hematology scheduled for Friday 06/17/2016. 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   Discharge Instructions    Activity as tolerated - No restrictions    Complete by:  As directed    Diet - low sodium heart healthy    Complete by:  As directed        DISCHARGE EXAM:  General: Alert, awake, oriented x3, in no apparent distress.  HEENT: [place]/AT PEERL, EOMI, anicteric Neck: Trachea midline, no masses, no thyromegal,y no JVD, no carotid bruit OROPHARYNX: Moist, No exudate/ erythema/lesions.  Heart: Regular rate and rhythm, II/VI SEM at base (baseline) without radiation. No rubs or gallops. PMI non-displaced. Exam reveals no decreased pulses. Pulmonary/Chest: Normal effort. Breath sounds normal. No. Apnea. Clear to auscultation,no stridor,  no wheezing and no rhonchi noted. No respiratory distress and no tenderness noted. Abdomen: Soft, nontender, nondistended, normal bowel sounds, no masses no hepatosplenomegaly noted. No fluid wave and no ascites. There is no guarding or rebound. Neuro: Alert and oriented to person, place and time. Normal motor skills, Displays no atrophy or tremors and exhibits normal muscle tone. No focal neurological deficits noted cranial nerves II through XII grossly intact. No sensory deficit noted. Strength at baseline in bilateral upper and lower extremities. Gait normal. Musculoskeletal: No warmth swelling or erythema around joints, no spinal tenderness noted. Psychiatric: Patient alert and oriented x3, good insight and cognition, good recent to remote recall. Mood, memory, affect and judgement norma Skin: Skin is warm and dry. No bruising, no ecchymosis and no rash noted. Pt is not diaphoretic. No erythema. No pallor l  Blood pressure 118/82, pulse 90, temperature 98.5 F (36.9 C), temperature source Oral, resp. rate 16, height 6' (1.829 m), weight 91.1 kg (200 lb 14.4 oz), SpO2 94 %.   Recent Labs  06/11/16 0830 06/13/16 1012  NA 138 137  K 4.6 4.4  CL 111 107  CO2 24 24  GLUCOSE 120* 110*  BUN 17 15  CREATININE 0.75 0.69  CALCIUM 8.6* 9.0    Recent Labs  06/11/16 0830  AST 44*  ALT 33  ALKPHOS 116  BILITOT 1.9*  PROT 7.1  ALBUMIN 3.8   No results for input(s): LIPASE, AMYLASE in the last  72 hours.  Recent Labs  06/11/16 0830 06/13/16 1012  WBC 11.3* 10.3  NEUTROABS 6.6 6.6  HGB 7.7* 7.7*  HCT 23.1* 22.4*  MCV 90.2 87.5  PLT 424* 372     Total time spent including face to face and decision making was greater than 30 minutes  Signed: [name],[name] 06/13/2016, 3:26 PM

## 2016-06-13 NOTE — Progress Notes (Signed)
Pt discharged home in stable condition. Discharge instructions given. Pt verbalized understanding. No immediate questions or concerns at this time.  

## 2016-06-13 NOTE — Progress Notes (Signed)
16 ml of Dilaudid PCA wasted in sharps. Witnessed by Nancy Marus, RN

## 2016-06-14 ENCOUNTER — Telehealth (HOSPITAL_COMMUNITY): Payer: Self-pay | Admitting: *Deleted

## 2016-06-14 ENCOUNTER — Encounter (HOSPITAL_COMMUNITY): Payer: Self-pay

## 2016-06-14 ENCOUNTER — Emergency Department (HOSPITAL_COMMUNITY)
Admission: EM | Admit: 2016-06-14 | Discharge: 2016-06-14 | Disposition: A | Discharge status: Home / Self Care | Payer: Medicare Other | Source: Home / Self Care

## 2016-06-14 ENCOUNTER — Non-Acute Institutional Stay (HOSPITAL_COMMUNITY)
Admission: AD | Admit: 2016-06-14 | Discharge: 2016-06-14 | Disposition: A | Discharge status: Home / Self Care | Payer: Medicare Other | Source: Ambulatory Visit | Attending: Internal Medicine | Admitting: Internal Medicine

## 2016-06-14 ENCOUNTER — Encounter (HOSPITAL_COMMUNITY): Payer: Self-pay | Admitting: *Deleted

## 2016-06-14 DIAGNOSIS — R079 Chest pain, unspecified: Secondary | ICD-10-CM | POA: Insufficient documentation

## 2016-06-14 DIAGNOSIS — Z832 Family history of diseases of the blood and blood-forming organs and certain disorders involving the immune mechanism: Secondary | ICD-10-CM | POA: Insufficient documentation

## 2016-06-14 DIAGNOSIS — Z7901 Long term (current) use of anticoagulants: Secondary | ICD-10-CM | POA: Insufficient documentation

## 2016-06-14 DIAGNOSIS — M25572 Pain in left ankle and joints of left foot: Secondary | ICD-10-CM | POA: Diagnosis not present

## 2016-06-14 DIAGNOSIS — M25571 Pain in right ankle and joints of right foot: Secondary | ICD-10-CM | POA: Diagnosis not present

## 2016-06-14 DIAGNOSIS — Z79899 Other long term (current) drug therapy: Secondary | ICD-10-CM | POA: Insufficient documentation

## 2016-06-14 DIAGNOSIS — Z86718 Personal history of other venous thrombosis and embolism: Secondary | ICD-10-CM | POA: Insufficient documentation

## 2016-06-14 DIAGNOSIS — D5700 Hb-SS disease with crisis, unspecified: Secondary | ICD-10-CM

## 2016-06-14 DIAGNOSIS — Z96641 Presence of right artificial hip joint: Secondary | ICD-10-CM | POA: Insufficient documentation

## 2016-06-14 DIAGNOSIS — Z79891 Long term (current) use of opiate analgesic: Secondary | ICD-10-CM | POA: Insufficient documentation

## 2016-06-14 DIAGNOSIS — Z87891 Personal history of nicotine dependence: Secondary | ICD-10-CM | POA: Insufficient documentation

## 2016-06-14 DIAGNOSIS — M545 Low back pain: Secondary | ICD-10-CM | POA: Diagnosis not present

## 2016-06-14 DIAGNOSIS — Z8673 Personal history of transient ischemic attack (TIA), and cerebral infarction without residual deficits: Secondary | ICD-10-CM | POA: Insufficient documentation

## 2016-06-14 DIAGNOSIS — Z5321 Procedure and treatment not carried out due to patient leaving prior to being seen by health care provider: Secondary | ICD-10-CM

## 2016-06-14 DIAGNOSIS — I2782 Chronic pulmonary embolism: Secondary | ICD-10-CM | POA: Diagnosis not present

## 2016-06-14 DIAGNOSIS — Z9981 Dependence on supplemental oxygen: Secondary | ICD-10-CM | POA: Insufficient documentation

## 2016-06-14 DIAGNOSIS — I10 Essential (primary) hypertension: Secondary | ICD-10-CM | POA: Insufficient documentation

## 2016-06-14 LAB — COMPREHENSIVE METABOLIC PANEL
ALBUMIN: 4.3 g/dL (ref 3.5–5.0)
ALK PHOS: 117 U/L (ref 38–126)
ALT: 56 U/L (ref 17–63)
AST: 86 U/L — AB (ref 15–41)
Anion gap: 7 (ref 5–15)
BILIRUBIN TOTAL: 5 mg/dL — AB (ref 0.3–1.2)
BUN: 20 mg/dL (ref 6–20)
CALCIUM: 9 mg/dL (ref 8.9–10.3)
CO2: 23 mmol/L (ref 22–32)
Chloride: 106 mmol/L (ref 101–111)
Creatinine, Ser: 0.84 mg/dL (ref 0.61–1.24)
GFR calc Af Amer: >60 mL/min (ref >60)
GFR calc non Af Amer: >60 mL/min (ref >60)
Glucose, Bld: 114 mg/dL — ABNORMAL HIGH (ref 65–99)
Potassium: 4.3 mmol/L (ref 3.5–5.1)
Sodium: 136 mmol/L (ref 135–145)
TOTAL PROTEIN: 8.2 g/dL — AB (ref 6.5–8.1)

## 2016-06-14 LAB — CBC WITH DIFFERENTIAL/PLATELET
BASOS ABS: 0.1 10*3/uL (ref 0.0–0.1)
BASOS PCT: 1 %
EOS PCT: 2 %
Eosinophils Absolute: 0.3 10*3/uL (ref 0.0–0.7)
HCT: 23.7 % — ABNORMAL LOW (ref 39.0–52.0)
Hemoglobin: 7.9 g/dL — ABNORMAL LOW (ref 13.0–17.0)
Lymphocytes Relative: 11 %
Lymphs Abs: 1.2 10*3/uL (ref 0.7–4.0)
MCH: 29.5 pg (ref 26.0–34.0)
MCHC: 33.3 g/dL (ref 30.0–36.0)
MCV: 88.4 fL (ref 78.0–100.0)
MONO ABS: 1.6 10*3/uL — AB (ref 0.1–1.0)
Monocytes Relative: 15 %
Neutro Abs: 7.8 10*3/uL — ABNORMAL HIGH (ref 1.7–7.7)
Neutrophils Relative %: 71 %
PLATELETS: 366 10*3/uL (ref 150–400)
RBC: 2.68 MIL/uL — ABNORMAL LOW (ref 4.22–5.81)
RDW: 20.2 % — AB (ref 11.5–15.5)
WBC: 11.1 10*3/uL — ABNORMAL HIGH (ref 4.0–10.5)

## 2016-06-14 LAB — RETICULOCYTES
RBC.: 2.68 MIL/uL — AB (ref 4.22–5.81)
RETIC CT PCT: 3.3 % — AB (ref 0.4–3.1)
Retic Count, Absolute: 88.4 10*3/uL (ref 19.0–186.0)

## 2016-06-14 MED ORDER — SODIUM CHLORIDE 0.9% FLUSH: 10.0000 mL | INTRAVENOUS | Status: DC | PRN

## 2016-06-14 MED ORDER — DEXTROSE-NACL 5-0.45 % IV SOLN
INTRAVENOUS | Status: DC
  Administered 2016-06-14: 09:00:00 via INTRAVENOUS

## 2016-06-14 MED ORDER — HEPARIN SOD (PORK) LOCK FLUSH 100 UNIT/ML IV SOLN
500.0000 [IU] | INTRAVENOUS | Status: DC | PRN
  Filled 2016-06-14: qty 5

## 2016-06-14 MED ORDER — SODIUM CHLORIDE 0.9% FLUSH: 9.0000 mL | INTRAVENOUS | Status: DC | PRN

## 2016-06-14 MED ORDER — HYDROMORPHONE 1 MG/ML IV SOLN
INTRAVENOUS | Status: DC
  Administered 2016-06-14: 09:00:00 via INTRAVENOUS
  Administered 2016-06-14: 19.9 mg via INTRAVENOUS
  Filled 2016-06-14: qty 25

## 2016-06-14 MED ORDER — HEPARIN SOD (PORK) LOCK FLUSH 100 UNIT/ML IV SOLN: 500.0000 [IU] | INTRAVENOUS | Status: DC | PRN

## 2016-06-14 MED ORDER — NALOXONE HCL 0.4 MG/ML IJ SOLN: 0.4000 mg | INTRAMUSCULAR | Status: DC | PRN

## 2016-06-14 MED ORDER — HEPARIN SOD (PORK) LOCK FLUSH 100 UNIT/ML IV SOLN
500.0000 [IU] | INTRAVENOUS | Status: AC | PRN
  Administered 2016-06-14: 500 [IU]

## 2016-06-14 NOTE — Discharge Summary (Signed)
[facility] Discharge Summary   Patient ID: [name] MRN: [id number] DOB/AGE: 07-12-1980 36 y.o.  Admit date: 06/14/2016 Discharge date: 06/14/2016  Primary Care Physician:  Tresa Garter, MD  Admission Diagnoses:  Active Problems:   Sickle cell crisis [facility])  Discharge Medications:  Allergies as of 06/14/2016   No Known Allergies     Medication List    TAKE these medications   carvedilol 3.125 MG tablet Commonly known as:  COREG Take 1 tablet (3.125 mg total) by mouth 2 (two) times daily with a meal.   cholecalciferol 1000 units tablet Commonly known as:  VITAMIN D Take 2,000 Units by mouth daily.   folic acid 1 MG tablet Commonly known as:  FOLVITE Take 2 tablets (2 mg total) by mouth daily.   gabapentin 300 MG capsule Commonly known as:  NEURONTIN Take 1 capsule (300 mg total) by mouth 3 (three) times daily.   HYDROmorphone 4 MG tablet Commonly known as:  DILAUDID Take 2 tablets (8 mg total) by mouth every 4 (four) hours as needed for moderate pain or severe pain.   L-glutamine 5 g Pack Powder Packet Commonly known as:  ENDARI Take 15 g by mouth 2 (two) times daily.   lisinopril 2.5 MG tablet Commonly known as:  PRINIVIL,ZESTRIL Take 2.5 mg by mouth daily.   morphine 30 MG 12 hr tablet Commonly known as:  MS CONTIN Take 1 tablet (30 mg total) by mouth every 12 (twelve) hours.   potassium chloride SA 20 MEQ tablet Commonly known as:  K-DUR,KLOR-CON Take 2 tablets (40 mEq total) by mouth daily.   rivaroxaban 20 MG Tabs tablet Commonly known as:  XARELTO Take 1 tablet (20 mg total) by mouth at bedtime.   torsemide 20 MG tablet Commonly known as:  DEMADEX Take 1 tablet (20 mg total) by mouth daily as needed (leg swelling).   zolpidem 10 MG tablet Commonly known as:  AMBIEN Take 1 tablet (10 mg total) by mouth at bedtime as needed for sleep.        Consults:  None  Significant Diagnostic Studies:  Dg Chest 2  View  Result Date: 06/06/2016 CLINICAL DATA:  Rib pain. Shortness of breath today. History of sickle cell anemia. EXAM: CHEST  2 VIEW COMPARISON:  Radiographs 05/28/2016, most recent chest CT 05/06/2016 FINDINGS: Left chest port remains in place. Cardiomegaly is similar to prior. Question of worsening vascular congestion versus differences in AP technique. No definite pleural effusion. No focal airspace disease. No pneumothorax. No acute rib abnormalities identified radiographically. IMPRESSION: Similar cardiomegaly. Question of worsening vascular congestion versus differences in technique. No new focal airspace disease. Electronically Signed   By: Jeb Levering M.D.   On: 06/06/2016 18:32   Dg Chest 2 View  Result Date: 05/28/2016 CLINICAL DATA:  36 year old male with sickle cell anemia presenting with bilateral rib pain. EXAM: CHEST  2 VIEW COMPARISON:  Chest radiograph dated 05/14/2016 FINDINGS: Left pectoral subclavian approach infusion catheter with tip over the right atrial silhouette adjacent to the cavoatrial junction. There is stable cardiomegaly with mild vascular congestion. No new consolidative changes. There is no pleural effusion or pneumothorax. No acute osseous pathology. IMPRESSION: 1. No focal consolidation. 2. Cardiomegaly with probable mild vascular congestion. Electronically Signed   By: Anner Crete M.D.   On: 05/28/2016 04:44     [facility] Course: [name], a 36 year old male with a history of sickle cell anemia presents for extended  observation and pain management.   Pain management:  D5.45 @ 75 for cellular rehydration High concentration PCA per weight based protocol. Patient used a total of 19.9 mg with 34 demands and 27 deliveries.  Pain intensity decreased from 8/10 to 3/10. [name] says that he can manage on current medication regimen.   Patient instructions:  Resume all home medications Follow up with Dr. Doreene Burke as  scheduled  The patient was given clear instructions to go to ER or return to medical center if symptoms do not improve, worsen or new problems develop. The patient verbalized understanding.    Physical Exam at Discharge:   BP 108/74 (BP Location: Left Arm)   Pulse 85   Temp 98.8 F (37.1 C) (Oral)   Resp 15   Ht 6' (1.829 m)   Wt 187 lb 9.6 oz (85.1 kg)   SpO2 94%   BMI 25.44 kg/m   General Appearance:    Alert, cooperative, no distress, appears stated age  Head:    Normocephalic, without obvious abnormality, atraumatic  Back:     Symmetric, no curvature, ROM normal, no CVA tenderness  Lungs:     Clear to auscultation bilaterally, respirations unlabored  Chest Wall:    No tenderness or deformity   Heart:    Irregular rate and rhythm, S1 and S2 normal, no murmur, rub   or gallop  Extremities:   Extremities normal, atraumatic, no cyanosis or edema  Pulses:   2+ and symmetric all extremities  Skin:   Skin color, texture, turgor normal, no rashes or lesions  Lymph nodes:   Cervical, supraclavicular, and axillary nodes normal  Neurologic:   CNII-XII intact, normal strength, sensation and reflexes    throughout    Disposition at Discharge: 01-Home or Self Care  Discharge Orders: Discharge Instructions    Discharge patient    Complete by:  As directed    Discharge disposition:  01-Home or Self Care   Discharge patient date:  06/14/2016      Condition at Discharge:   Stable  Time spent on Discharge:  Greater than 30 minutes.  Signed: Teriah Muela M 06/14/2016, 3:22 PM

## 2016-06-14 NOTE — ED Notes (Signed)
Pt reports he spoke to the clinic and is going to go there.  Pt no longer wants to be seen in ED.

## 2016-06-14 NOTE — Progress Notes (Signed)
[name] admitted to the [name] [place] c/o back and ankle pain. [name] reported a pain score of 9/10 on pain scale. [name] was treated with IV fluids, and placed on a Dilaudid PCA. At time of discharge [name] rates his pain a 4/10 on pain scale. Discharge instructions given to [name] and [name] states an understanding. [name] alert, oriented, and ambulatory at time of discharge.

## 2016-06-14 NOTE — Discharge Instructions (Signed)
Sickle Cell Anemia, Adult °Sickle cell anemia is a condition where your red blood cells are shaped like sickles. Red blood cells carry oxygen through the body. Sickle-shaped red blood cells do not live as long as normal red blood cells. They also clump together and block blood from flowing through the blood vessels. These things prevent the body from getting enough oxygen. Sickle cell anemia causes organ damage and pain. It also increases the risk of infection. °Follow these instructions at home: °· Drink enough fluid to keep your pee (urine) clear or pale yellow. Drink more in hot weather and during exercise. °· Do not smoke. Smoking lowers oxygen levels in the blood. °· Only take over-the-counter or prescription medicines as told by your doctor. °· Take antibiotic medicines as told by your doctor. Make sure you finish them even if you start to feel better. °· Take supplements as told by your doctor. °· Consider wearing a medical alert bracelet. This tells anyone caring for you in an emergency of your condition. °· When traveling, keep your medical information, doctors' names, and the medicines you take with you at all times. °· If you have a fever, do not take fever medicines right away. This could cover up a problem. Tell your doctor. °· Keep all follow-up visits with your doctor. Sickle cell anemia requires regular medical care. °Contact a doctor if: °You have a fever. °Get help right away if: °· You feel dizzy or faint. °· You have new belly (abdominal) pain, especially on the left side near the stomach area. °· You have a lasting, often uncomfortable and painful erection of the penis (priapism). If it is not treated right away, you will become unable to have sex (impotence). °· You have numbness in your arms or legs or you have a hard time moving them. °· You have a hard time talking. °· You have a fever or lasting symptoms for more than 2-3 days. °· You have a fever and your symptoms suddenly get  worse. °· You have signs or symptoms of infection. These include: °? Chills. °? Being more tired than normal (lethargy). °? Irritability. °? Poor eating. °? Throwing up (vomiting). °· You have pain that is not helped with medicine. °· You have shortness of breath. °· You have pain in your chest. °· You are coughing up pus-like or bloody mucus. °· You have a stiff neck. °· Your feet or hands swell or have pain. °· Your belly looks bloated. °· Your joints hurt. °This information is not intended to replace advice given to you by your health care provider. Make sure you discuss any questions you have with your health care provider. °Document Released: 11/07/2012 Document Revised: 06/25/2015 Document Reviewed: 08/29/2012 °Elsevier Interactive Patient Education © 2017 [employer] ° °

## 2016-06-14 NOTE — H&P (Signed)
[facility] History and Physical   Date: 06/14/2016  Patient name: [name] Medical record number: [id number] Date of birth: [birth year] Age: 36 y.o. Gender: male PCP: Tresa Garter, MD  Attending physician: Tresa Garter, MD  Chief Complaint: Low back pain and ankle pain History of Present Illness: [name], a 36 year old male with a history of sickle cell anemia, HbSS,  presents complaining of low back and bilateral ankle pain. Pain is consistent with typical sickle cell pain. He was recently discharged from inpatient services on 06/06/2016. Also, he was treated and evaluated in the emergency department on 06/13/2016. [name] laboratory values were at baseline. There were no acute findings recent chest xray. He last had Dilaudid 4 mg and MS Contin this am without sustained relief. Current pain intensity is 8/10 described as aching. He is on chronic anticoagulation therapy for a history of pulmonary embolism. He is also on 2 liter of oxygen at home. [name] denies headache, fatigue, paresthesias, weakness, gait abnormality, nausea, vomiting, or diarrhea.   Meds: Prescriptions Prior to Admission  Medication Sig Dispense Refill Last Dose  . carvedilol (COREG) 3.125 MG tablet Take 1 tablet (3.125 mg total) by mouth 2 (two) times daily with a meal.   06/13/2016 at Unknown time  . cholecalciferol (VITAMIN D) 1000 units tablet Take 2,000 Units by mouth daily.   06/13/2016 at Unknown time  . folic acid (FOLVITE) 1 MG tablet Take 2 tablets (2 mg total) by mouth daily.   06/13/2016 at Unknown time  . gabapentin (NEURONTIN) 300 MG capsule Take 1 capsule (300 mg total) by mouth 3 (three) times daily. 270 capsule 3 06/13/2016 at Unknown time  . HYDROmorphone (DILAUDID) 4 MG tablet Take 2 tablets (8 mg total) by mouth every 4 (four) hours as needed for moderate pain or severe pain. 90 tablet 0 06/14/2016 at Unknown time  . L-glutamine (ENDARI) 5 g PACK  Powder Packet Take 15 g by mouth 2 (two) times daily. 180 packet 1 06/13/2016 at Unknown time  . lisinopril (PRINIVIL,ZESTRIL) 2.5 MG tablet Take 2.5 mg by mouth daily.   06/13/2016 at Unknown time  . morphine (MS CONTIN) 30 MG 12 hr tablet Take 1 tablet (30 mg total) by mouth every 12 (twelve) hours. 60 tablet 0 06/14/2016 at Unknown time  . potassium chloride SA (K-DUR,KLOR-CON) 20 MEQ tablet Take 2 tablets (40 mEq total) by mouth daily. 30 tablet 2 06/13/2016 at Unknown time  . rivaroxaban (XARELTO) 20 MG TABS tablet Take 1 tablet (20 mg total) by mouth at bedtime. 30 tablet 3 06/13/2016 at Unknown time  . zolpidem (AMBIEN) 10 MG tablet Take 1 tablet (10 mg total) by mouth at bedtime as needed for sleep. 30 tablet 0 06/13/2016 at Unknown time  . torsemide (DEMADEX) 20 MG tablet Take 1 tablet (20 mg total) by mouth daily as needed (leg swelling).   06/12/2016    Allergies: Patient has no known allergies. Past Medical History:  Diagnosis Date  . Acute chest syndrome ([place]) 06/18/2013  . Acute embolism and thrombosis of right internal jugular vein ([place])   . Alcohol consumption of one to four drinks per day   . Avascular necrosis (HCC)    Right Hip  . Blood transfusion   . Chronic anticoagulation   . Demand ischemia ([place]) 01/02/2014  . Former smoker   . Functional asplenia   . Hb-SS disease with crisis ([place])   . History of Clostridium difficile infection   .  History of pulmonary embolus (PE)   . Hypertension   . Hypokalemia   . Leukocytosis    Chronic  . Mood disorder ([place])   . Noncompliance with medication regimen   . Oxygen deficiency   . Pulmonary hypertension ([place])   . Second hand tobacco smoke exposure   . Sickle cell anemia (HCC)   . Sickle-cell crisis with associated acute chest syndrome ([place]) 05/13/2013  . Stroke ([place])   . Thrombocytosis (HCC)    Chronic  . Uses marijuana    Past Surgical History:  Procedure Laterality Date  . CHOLECYSTECTOMY     01/2008  . Excision of left  periauricular cyst     10/2009  . Excision of right ear lobe cyst with primary closur     11/2007  . Porta cath placement    . Porta cath removal    . PORTACATH PLACEMENT  01/05/2012   Procedure: INSERTION PORT-A-CATH;  Surgeon: Odis Hollingshead, MD;  Location: [facility];  Service: General;  Laterality: N/A;  ultrasound guiced port a cath insertion with fluoroscopy  . Right hip replacement     08/2006  . UMBILICAL HERNIA REPAIR     01/2008   Family History  Problem Relation Age of Onset  . Sickle cell trait Mother   . Depression Mother   . Diabetes Mother   . Sickle cell trait Father   . Sickle cell trait Brother    Social History   Social History  . Marital status: Single    Spouse name: N/A  . Number of children: 0  . Years of education: 57   Occupational History  . 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 Smoking status: Former Smoker    Packs/day: 0.50    Years: 10.00    Types: Cigarettes    Quit date: 05/29/2011  . Smokeless tobacco: Never Used  . Alcohol use No  . Drug use: No     Comment: Marijuana weekly  . Sexual activity: Yes    Partners: Female     Comment: month ago   Other Topics Concern  . Not on file   Social History Narrative   Lives in an apartment.  Single.  Lives alone but has a girlfriend that helps care for him.  Does not use any assist devices.        [name]:  [phone] Mom, emergency contact      [facility]:   Patient continuing to live in her apartment in town alone. 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    Review of Systems: Constitutional: negative for fatigue and fevers Eyes: positive for icterus Ears, nose, mouth, throat, and face: negative Respiratory: negative for cough and dyspnea on exertion Cardiovascular: negative for chest pain, dyspnea, fatigue and irregular heart beat Gastrointestinal: negative for abdominal pain, constipation, diarrhea and  nausea Genitourinary:negative for dysuria and frequency Integument/breast: negative Musculoskeletal:positive for back pain and myalgias Neurological: negative for dizziness, gait problems, headaches, paresthesia and weakness Behavioral/Psych: negative Endocrine: negative Allergic/Immunologic: negative  Physical Exam: Blood pressure 130/77, pulse 92, temperature 99 F (37.2 C), temperature source Oral, resp. rate 20, height 6' (1.829 m), weight 187 lb 9.6 oz (85.1 kg), SpO2 96 %. BP 108/74 (BP Location: Left Arm)   Pulse 85   Temp 98.8 F (37.1 C) (Oral)   Resp 15   Ht 6' (1.829 m)   Wt 187 lb  9.6 oz (85.1 kg)   SpO2 94%   BMI 25.44 kg/m   General Appearance:    Alert, cooperative, no distress, appear older than age  Head:    Normocephalic, without obvious abnormality, atraumatic  Eyes:    PERRL, conjunctiva/corneas clear, EOM's intact, fundi    benign, both eyes       Ears:    Normal TM's and external ear canals, both ears  Nose:   Nares normal, septum midline, mucosa normal, no drainage    or sinus tenderness  Throat:   Lips, mucosa, and tongue normal; teeth and gums normal  Neck:   Supple, symmetrical, trachea midline, no adenopathy;       thyroid:  No enlargement/tenderness/nodules; no carotid   bruit or JVD  Back:     Symmetric, no curvature, ROM normal, no CVA tenderness  Lungs:     Clear to auscultation bilaterally, respirations unlabored  Chest wall:    No tenderness or deformity  Heart:    Irregular rate and rhythm, S1 and S2 normal, no murmur, rub   or gallop  Abdomen:     Soft, non-tender, bowel sounds active all four quadrants,    no masses, no organomegaly  Extremities:   Extremities normal, atraumatic, no cyanosis or edema  Pulses:   2+ and symmetric all extremities  Skin:   Skin color, texture, turgor normal, no rashes or lesions  Lymph nodes:   Cervical, supraclavicular, and axillary nodes normal  Neurologic:   CNII-XII intact. Normal strength, sensation and  reflexes      throughout    Lab results: Results for orders placed or performed during the hospital encounter of 06/06/16 (from the past 24 hour(s))  CBC with Differential/Platelet     Status: Abnormal   Collection Time: 06/13/16 10:12 AM  Result Value Ref Range   WBC 10.3 4.0 - 10.5 K/uL   RBC 2.56 (L) 4.22 - 5.81 MIL/uL   Hemoglobin 7.7 (L) 13.0 - 17.0 g/dL   HCT 22.4 (L) 39.0 - 52.0 %   MCV 87.5 78.0 - 100.0 fL   MCH 30.1 26.0 - 34.0 pg   MCHC 34.4 30.0 - 36.0 g/dL   RDW 20.0 (H) 11.5 - 15.5 %   Platelets 372 150 - 400 K/uL   Neutrophils Relative % 64 %   Lymphocytes Relative 17 %   Monocytes Relative 14 %   Eosinophils Relative 4 %   Basophils Relative 1 %   nRBC 1 (H) 0 /100 WBC   Neutro Abs 6.6 1.7 - 7.7 K/uL   Lymphs Abs 1.8 0.7 - 4.0 K/uL   Monocytes Absolute 1.4 (H) 0.1 - 1.0 K/uL   Eosinophils Absolute 0.4 0.0 - 0.7 K/uL   Basophils Absolute 0.1 0.0 - 0.1 K/uL   RBC Morphology TARGET CELLS   Reticulocytes     Status: Abnormal   Collection Time: 06/13/16 10:12 AM  Result Value Ref Range   Retic Ct Pct 3.5 (H) 0.4 - 3.1 %   RBC. 2.56 (L) 4.22 - 5.81 MIL/uL   Retic Count, Manual 89.6 19.0 - 186.0 K/uL  Basic metabolic panel     Status: Abnormal   Collection Time: 06/13/16 10:12 AM  Result Value Ref Range   Sodium 137 135 - 145 mmol/L   Potassium 4.4 3.5 - 5.1 mmol/L   Chloride 107 101 - 111 mmol/L   CO2 24 22 - 32 mmol/L   Glucose, Bld 110 (H) 65 - 99 mg/dL   BUN 15  6 - 20 mg/dL   Creatinine, Ser 0.69 0.61 - 1.24 mg/dL   Calcium 9.0 8.9 - 10.3 mg/dL   GFR calc non Af Amer >60 >60 mL/min   GFR calc Af Amer >60 >60 mL/min   Anion gap 6 5 - 15   *Note: Due to a large number of results and/or encounters for the requested time period, some results have not been displayed. A complete set of results can be found in Results Review.    Imaging results:  No results found.   Assessment & Plan:  Patient will be admitted to the day infusion center for extended  observation  Start IV D5.45 for cellular rehydration at 75/hr  Start Dilaudid PCA High Concentration per weight based protocol.   Patient will be re-evaluated for pain intensity in the context of function and relationship to baseline as care progresses.  If no significant pain relief, will transfer patient to inpatient services for a higher level of care.   Reviewed clothes, consistent with baseline   Cornelis Kluver M 06/14/2016, 8:58 AM

## 2016-06-14 NOTE — ED Notes (Signed)
Pt request labs to come from port

## 2016-06-14 NOTE — Telephone Encounter (Signed)
Patient called requesting treatment at the [facility] c/o back and ankle pain and rates it 9/10 on pain scale. Patient denies fever, chest pain, N/V/D, abdominal pain or priapism. Patient reports last taking him home medication Dilaudid and MS Cotin around 6:30 am with no relief.  Patient placed on a brief hold and provider notified. Patient advised to come to the [facility] for treatment per Cammie Sickle FNP. Patient also advised to wear oxygen. Patient states an understanding.

## 2016-06-14 NOTE — ED Triage Notes (Signed)
Patient c/o back, ribs, and leg pain due to sickle cell. No SOB or c/o chest pain. Pain started last night. Patient stated he he took MS Contin 4 mg at 0600 today.

## 2016-06-15 DIAGNOSIS — D571 Sickle-cell disease without crisis: Secondary | ICD-10-CM | POA: Diagnosis not present

## 2016-06-15 DIAGNOSIS — Z8673 Personal history of transient ischemic attack (TIA), and cerebral infarction without residual deficits: Secondary | ICD-10-CM | POA: Diagnosis not present

## 2016-06-15 DIAGNOSIS — D649 Anemia, unspecified: Secondary | ICD-10-CM | POA: Diagnosis not present

## 2016-06-15 DIAGNOSIS — D5700 Hb-SS disease with crisis, unspecified: Secondary | ICD-10-CM | POA: Diagnosis not present

## 2016-06-15 DIAGNOSIS — I639 Cerebral infarction, unspecified: Secondary | ICD-10-CM | POA: Diagnosis not present

## 2016-06-20 ENCOUNTER — Other Ambulatory Visit: Payer: Self-pay

## 2016-06-21 ENCOUNTER — Other Ambulatory Visit: Payer: Self-pay

## 2016-06-21 IMAGING — CR DG CHEST 2V
2 series · 2 of 2 positions shown · non-contrast
Comparison: PA and lateral chest of June 26, 2013

CLINICAL DATA: Sickle cell crisis with chest pain and fever

EXAM:
CHEST  2 VIEW

[w chest pa]
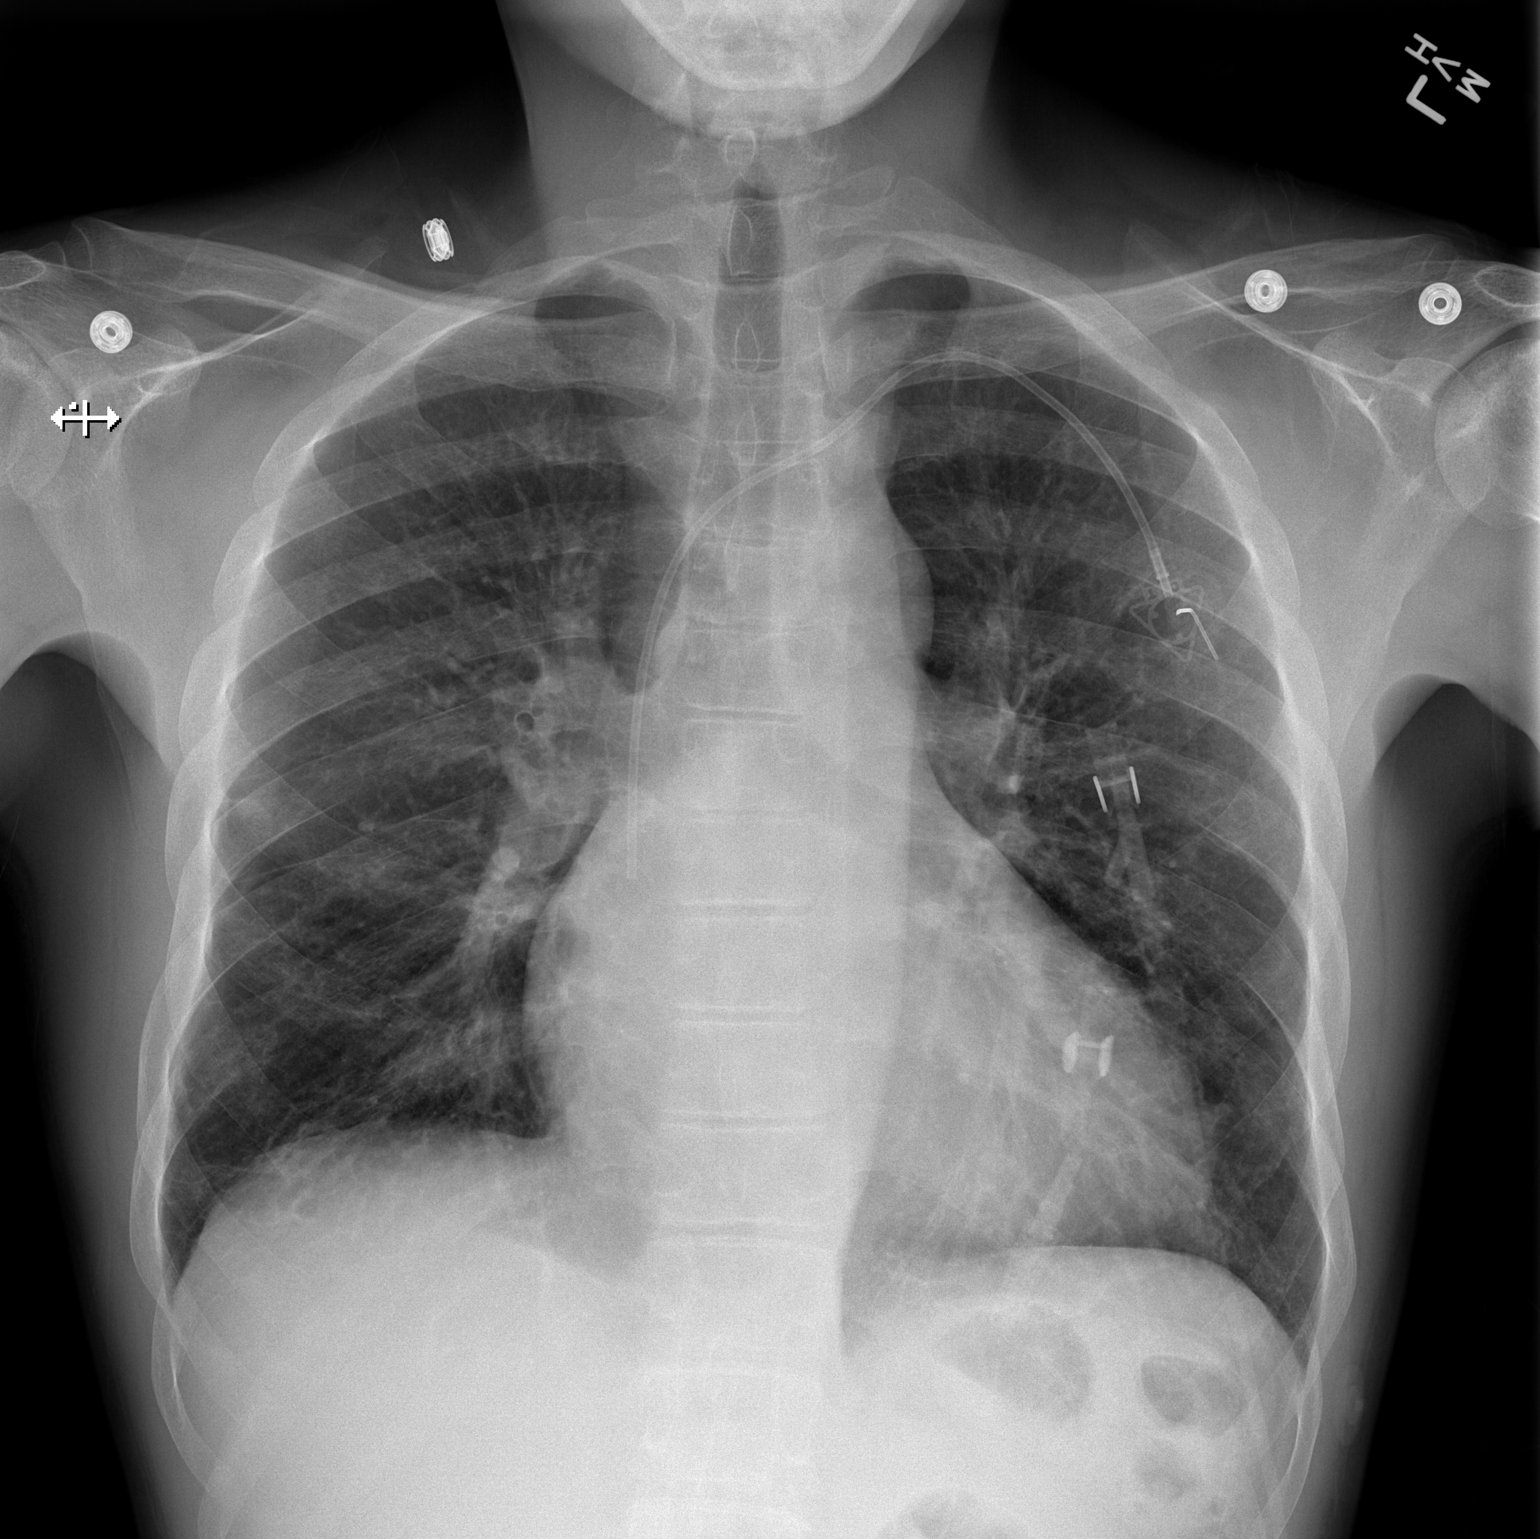

[w chest lat]
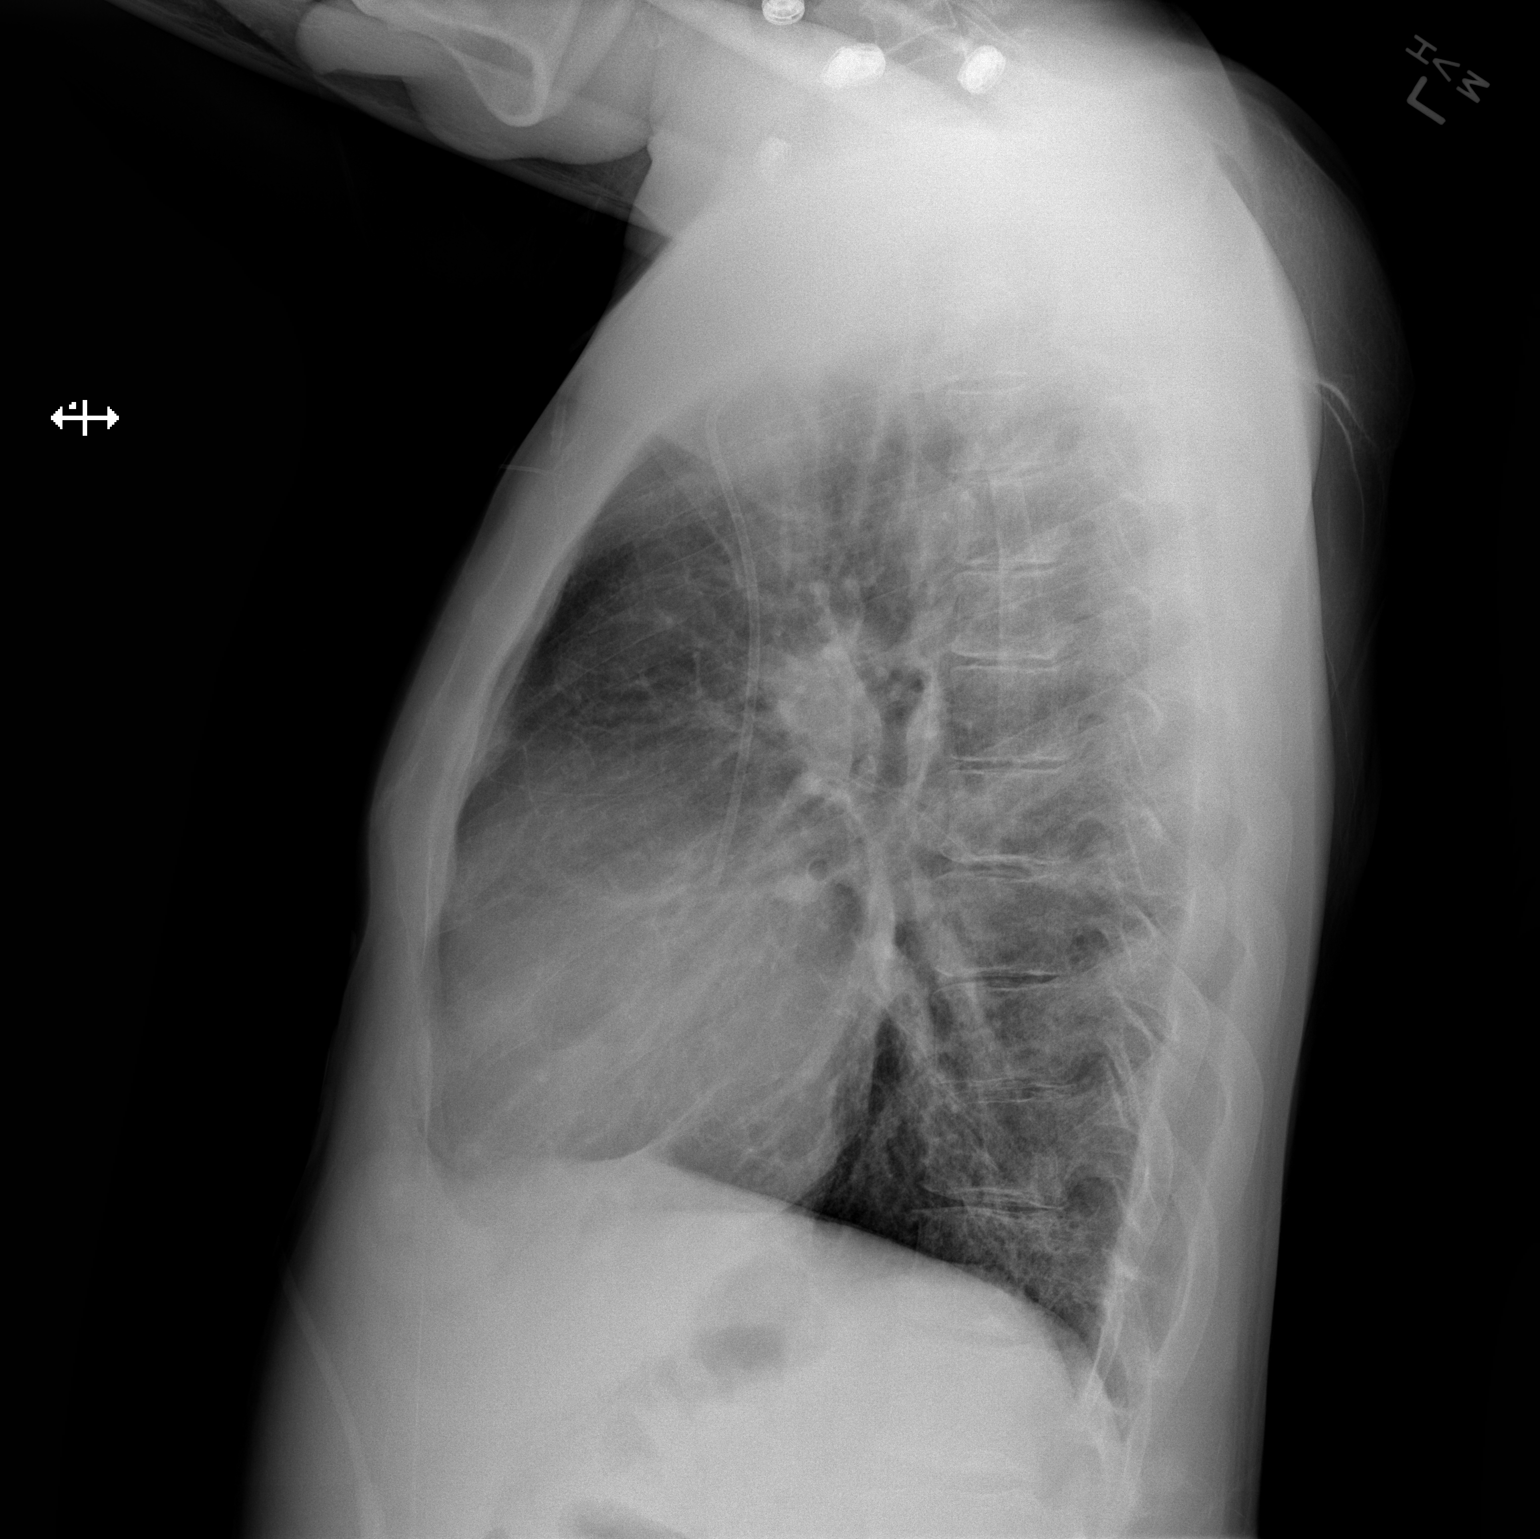

[2 of 2 positions shown; findings below may reference images not displayed]

FINDINGS: The lungs remain well-expanded. The interstitial markings are mildly
increased bilaterally but less conspicuous than on the earlier
study. There is no alveolar pneumonia. The cardiac silhouette
remains enlarged. The central pulmonary vascularity is prominent but
stable. The Port-A-Cath appliance tip lies in the region of the mid
to distal SVC. The bony thorax is unremarkable.
IMPRESSION: There is no pneumonia. There is stable enlargement of the cardiac
silhouette. Mild prominence of the interstitial markings has
improved since the previous study.

## 2016-06-21 NOTE — Patient Outreach (Signed)
[facility]) Care Management   06/21/16  [name] 08-Aug-1980 [id number]  Successful outreach completed with patient. RNCM provided explanation of RNCM role and [facility] Care Management and patient declined services at this time stating that he is "doing fine" and doesn't feel like he needs outreach at this time. RNCM invited patient to call if he changes his mind and would like services in the future and patient was agreeable.    [place] R. Dquan Cortopassi, RN, BSN, [place] Management Coordinator [phone]

## 2016-06-22 ENCOUNTER — Emergency Department (HOSPITAL_COMMUNITY)
Admission: EM | Admit: 2016-06-22 | Discharge: 2016-06-22 | Disposition: A | Discharge status: Home / Self Care | Payer: Medicare Other | Source: Home / Self Care | Attending: Emergency Medicine | Admitting: Emergency Medicine

## 2016-06-22 ENCOUNTER — Encounter (HOSPITAL_COMMUNITY): Payer: Self-pay

## 2016-06-22 ENCOUNTER — Ambulatory Visit: Payer: Self-pay | Admitting: Internal Medicine

## 2016-06-22 DIAGNOSIS — I2721 Secondary pulmonary arterial hypertension: Secondary | ICD-10-CM | POA: Diagnosis not present

## 2016-06-22 DIAGNOSIS — I2781 Cor pulmonale (chronic): Secondary | ICD-10-CM | POA: Diagnosis not present

## 2016-06-22 DIAGNOSIS — D571 Sickle-cell disease without crisis: Secondary | ICD-10-CM | POA: Insufficient documentation

## 2016-06-22 DIAGNOSIS — Z87891 Personal history of nicotine dependence: Secondary | ICD-10-CM | POA: Insufficient documentation

## 2016-06-22 DIAGNOSIS — D72829 Elevated white blood cell count, unspecified: Secondary | ICD-10-CM | POA: Diagnosis not present

## 2016-06-22 DIAGNOSIS — Z7901 Long term (current) use of anticoagulants: Secondary | ICD-10-CM

## 2016-06-22 DIAGNOSIS — I10 Essential (primary) hypertension: Secondary | ICD-10-CM | POA: Diagnosis present

## 2016-06-22 DIAGNOSIS — D5700 Hb-SS disease with crisis, unspecified: Secondary | ICD-10-CM

## 2016-06-22 DIAGNOSIS — Z86718 Personal history of other venous thrombosis and embolism: Secondary | ICD-10-CM | POA: Diagnosis not present

## 2016-06-22 DIAGNOSIS — Z79899 Other long term (current) drug therapy: Secondary | ICD-10-CM

## 2016-06-22 DIAGNOSIS — Z9981 Dependence on supplemental oxygen: Secondary | ICD-10-CM | POA: Diagnosis not present

## 2016-06-22 DIAGNOSIS — R0789 Other chest pain: Secondary | ICD-10-CM | POA: Diagnosis not present

## 2016-06-22 DIAGNOSIS — Z9114 Patient's other noncompliance with medication regimen: Secondary | ICD-10-CM | POA: Diagnosis not present

## 2016-06-22 DIAGNOSIS — Z818 Family history of other mental and behavioral disorders: Secondary | ICD-10-CM | POA: Diagnosis not present

## 2016-06-22 DIAGNOSIS — Z833 Family history of diabetes mellitus: Secondary | ICD-10-CM

## 2016-06-22 DIAGNOSIS — J9621 Acute and chronic respiratory failure with hypoxia: Secondary | ICD-10-CM | POA: Diagnosis not present

## 2016-06-22 DIAGNOSIS — D638 Anemia in other chronic diseases classified elsewhere: Secondary | ICD-10-CM | POA: Diagnosis present

## 2016-06-22 DIAGNOSIS — G894 Chronic pain syndrome: Secondary | ICD-10-CM | POA: Diagnosis not present

## 2016-06-22 DIAGNOSIS — Z8673 Personal history of transient ischemic attack (TIA), and cerebral infarction without residual deficits: Secondary | ICD-10-CM

## 2016-06-22 LAB — COMPREHENSIVE METABOLIC PANEL
ALT: 26 U/L (ref 17–63)
AST: 38 U/L (ref 15–41)
Albumin: 4 g/dL (ref 3.5–5.0)
Alkaline Phosphatase: 87 U/L (ref 38–126)
Anion gap: 7 (ref 5–15)
BUN: 8 mg/dL (ref 6–20)
CALCIUM: 9.1 mg/dL (ref 8.9–10.3)
CO2: 25 mmol/L (ref 22–32)
CREATININE: 0.7 mg/dL (ref 0.61–1.24)
Chloride: 105 mmol/L (ref 101–111)
Glucose, Bld: 97 mg/dL (ref 65–99)
POTASSIUM: 3.5 mmol/L (ref 3.5–5.1)
Sodium: 137 mmol/L (ref 135–145)
TOTAL PROTEIN: 7.3 g/dL (ref 6.5–8.1)
Total Bilirubin: 4.4 mg/dL — ABNORMAL HIGH (ref 0.3–1.2)

## 2016-06-22 LAB — CBC WITH DIFFERENTIAL/PLATELET
BASOS ABS: 0.1 10*3/uL (ref 0.0–0.1)
Basophils Relative: 1 %
Eosinophils Absolute: 0.2 10*3/uL (ref 0.0–0.7)
Eosinophils Relative: 2 %
HCT: 20.9 % — ABNORMAL LOW (ref 39.0–52.0)
Hemoglobin: 7.1 g/dL — ABNORMAL LOW (ref 13.0–17.0)
LYMPHS ABS: 1.4 10*3/uL (ref 0.7–4.0)
LYMPHS PCT: 10 %
MCH: 30.5 pg (ref 26.0–34.0)
MCHC: 34 g/dL (ref 30.0–36.0)
MCV: 89.7 fL (ref 78.0–100.0)
Monocytes Absolute: 2.3 10*3/uL — ABNORMAL HIGH (ref 0.1–1.0)
Monocytes Relative: 17 %
NEUTROS ABS: 9.6 10*3/uL — AB (ref 1.7–7.7)
Neutrophils Relative %: 70 %
Platelets: 415 10*3/uL — ABNORMAL HIGH (ref 150–400)
RBC: 2.33 MIL/uL — ABNORMAL LOW (ref 4.22–5.81)
RDW: 18.8 % — AB (ref 11.5–15.5)
WBC: 13.5 10*3/uL — ABNORMAL HIGH (ref 4.0–10.5)

## 2016-06-22 LAB — RETICULOCYTES
RBC.: 2.33 MIL/uL — ABNORMAL LOW (ref 4.22–5.81)
Retic Count, Absolute: 172.4 10*3/uL (ref 19.0–186.0)
Retic Ct Pct: 7.4 % — ABNORMAL HIGH (ref 0.4–3.1)

## 2016-06-22 MED ORDER — HEPARIN SOD (PORK) LOCK FLUSH 100 UNIT/ML IV SOLN
500.0000 [IU] | Freq: Once | INTRAVENOUS | Status: AC
  Administered 2016-06-22: 500 [IU]
  Filled 2016-06-22: qty 5

## 2016-06-22 MED ORDER — HYDROMORPHONE HCL 2 MG/ML IJ SOLN
2.0000 mg | INTRAMUSCULAR | Status: AC
  Administered 2016-06-22: 2 mg via INTRAVENOUS
  Filled 2016-06-22: qty 1

## 2016-06-22 MED ORDER — DIPHENHYDRAMINE HCL 25 MG PO CAPS
25.0000 mg | ORAL_CAPSULE | ORAL | Status: DC | PRN
  Administered 2016-06-22: 25 mg via ORAL
  Filled 2016-06-22: qty 1

## 2016-06-22 MED ORDER — DIPHENHYDRAMINE HCL 25 MG PO CAPS
25.0000 mg | ORAL_CAPSULE | Freq: Once | ORAL | Status: AC
  Administered 2016-06-22: 25 mg via ORAL
  Filled 2016-06-22: qty 1

## 2016-06-22 MED ORDER — HYDROMORPHONE HCL 2 MG/ML IJ SOLN
2.0000 mg | INTRAMUSCULAR | Status: AC
  Filled 2016-06-22: qty 1

## 2016-06-22 MED ORDER — HYDROMORPHONE HCL 2 MG/ML IJ SOLN
2.0000 mg | Freq: Once | INTRAMUSCULAR | Status: AC
  Administered 2016-06-22: 2 mg via INTRAVENOUS
  Filled 2016-06-22: qty 1

## 2016-06-22 MED ORDER — HYDROMORPHONE HCL 2 MG/ML IJ SOLN
2.0000 mg | INTRAMUSCULAR | Status: AC
  Administered 2016-06-22: 2 mg via INTRAVENOUS

## 2016-06-22 MED ORDER — HYDROMORPHONE HCL 2 MG/ML IJ SOLN: 2.0000 mg | INTRAMUSCULAR | Status: AC

## 2016-06-22 NOTE — ED Notes (Signed)
Awaiting patients brother to come for discharge before releasing since patient has had a large amount of Diluadid.

## 2016-06-22 NOTE — Discharge Instructions (Signed)
We saw you in the ER for your sickle cell related pain. °The labs and imaging indicate no true crisis at this time. We understand you are in sickle cell related pain none the less, and advise to call your sickle cell doctor for a followup as soon as possible. °If your symptoms get worse, return to the ER. ° °

## 2016-06-22 NOTE — ED Triage Notes (Signed)
Pt with sickle cell pain.  Back and rib pain since this morning.  No chest pain.

## 2016-06-22 NOTE — ED Provider Notes (Addendum)
[facility] DEPT Provider Note   CSN: [id number] Arrival date & time: 06/22/16  1650     History   Chief Complaint Chief Complaint  Patient presents with  . Sickle Cell Pain Crisis  . Back Pain    HPI [name] is a 36 y.o. male.  HPI Pt comes in with cc of sickle cell pain. Pt reports that he started having severe pain to his ribs and back this afternoon. Pain is at 8/10, and it is sharp and throbbing, similar to his sickle cell pain. Pt has no new fevers, chills. Pt denies nausea, emesis, shortness of breath, headaches, abdominal pain, uti like symptoms.   Past Medical History:  Diagnosis Date  . Acute chest syndrome ([place]) 06/18/2013  . Acute embolism and thrombosis of right internal jugular vein ([place])   . Alcohol consumption of one to four drinks per day   . Avascular necrosis (HCC)    Right Hip  . Blood transfusion   . Chronic anticoagulation   . Demand ischemia ([place]) 01/02/2014  . Former smoker   . Functional asplenia   . Hb-SS disease with crisis ([place])   . History of Clostridium difficile infection   . History of pulmonary embolus (PE)   . Hypertension   . Hypokalemia   . Leukocytosis    Chronic  . Mood disorder ([place])   . Noncompliance with medication regimen   . Oxygen deficiency   . Pulmonary hypertension ([place])   . Second hand tobacco smoke exposure   . Sickle cell anemia (HCC)   . Sickle-cell crisis with associated acute chest syndrome ([place]) 05/13/2013  . Stroke ([place])   . Thrombocytosis (HCC)    Chronic  . Uses marijuana     Patient Active Problem List   Diagnosis Date Noted  . Sickle cell crisis ([place]) 02/23/2016  . H/O arterial ischemic stroke 11/11/2015  . Slurred speech 11/10/2015  . Chronic anemia   . Hb-SS disease without crisis ([place]) 10/07/2014  . Prolonged Q-T interval on ECG 09/10/2014  . Troponin level elevated 08/29/2014  . Cor pulmonale, chronic ([place]) 08/25/2014  . Sickle cell anemia ([place]) 06/25/2014  . Anemia of chronic  disease 06/25/2014  . PAH (pulmonary artery hypertension) ([place]) 03/18/2014  . Chronic respiratory failure with hypoxia ([place]) 03/14/2014  . Paralytic strabismus, external ophthalmoplegia   . Chronic pain syndrome 12/12/2013  . Chronic anticoagulation 08/22/2013  . Essential hypertension 08/22/2013  . Pulmonary HTN ([place]) 06/18/2013  . Functional asplenia   . Vitamin D deficiency 02/13/2013  . Hx of pulmonary embolus 06/29/2012  . 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 Avascular necrosis [facility])     Past Surgical History:  Procedure Laterality Date  . CHOLECYSTECTOMY     01/2008  . Excision of left periauricular cyst     10/2009  . Excision of right ear lobe cyst with primary closur     11/2007  . Porta cath placement    . Porta cath removal    . PORTACATH PLACEMENT  01/05/2012   Procedure: INSERTION PORT-A-CATH;  Surgeon: Odis Hollingshead, MD;  Location: [facility];  Service: General;  Laterality: N/A;  ultrasound guiced port a cath insertion with fluoroscopy  . Right hip replacement     08/2006  . UMBILICAL HERNIA REPAIR     01/2008       Home Medications    Prior to Admission medications   Medication Sig Start Date End Date Taking? Authorizing Provider  carvedilol (COREG) 3.125 MG tablet Take  1 tablet (3.125 mg total) by mouth 2 (two) times daily with a meal. 05/17/16  Yes [name], MD  cholecalciferol (VITAMIN D) 1000 units tablet Take 2,000 Units by mouth daily.   Yes [provider]  folic acid (FOLVITE) 1 MG tablet Take 2 tablets (2 mg total) by mouth daily. 04/25/16  Yes [name], MD  gabapentin (NEURONTIN) 300 MG capsule Take 1 capsule (300 mg total) by mouth 3 (three) times daily. 06/01/15  Yes Jegede, Olugbemiga E, MD  LETAIRIS 5 MG tablet Take 5 mg by mouth at bedtime.  06/15/16  Yes [provider]  lisinopril (PRINIVIL,ZESTRIL) 2.5 MG tablet Take 2.5 mg by mouth at bedtime.  05/22/16  Yes [provider]  morphine (MS  CONTIN) 30 MG 12 hr tablet Take 1 tablet (30 mg total) by mouth every 12 (twelve) hours. 06/02/16 07/02/16 Yes Jegede, Marlena Clipper, MD  potassium chloride SA (K-DUR,KLOR-CON) 20 MEQ tablet Take 2 tablets (40 mEq total) by mouth daily. 04/27/16  Yes [name], MD  rivaroxaban (XARELTO) 20 MG TABS tablet Take 1 tablet (20 mg total) by mouth at bedtime. 05/24/16  Yes Dorena Dew, FNP  zolpidem (AMBIEN) 10 MG tablet Take 1 tablet (10 mg total) by mouth at bedtime as needed for sleep. Patient taking differently: Take 10 mg by mouth at bedtime.  06/02/16 07/02/16 Yes Jegede, Marlena Clipper, MD  L-glutamine (ENDARI) 5 g PACK Powder Packet Take 15 g by mouth 2 (two) times daily. Patient not taking: Reported on 06/22/2016 04/25/16   [name], MD  torsemide (DEMADEX) 20 MG tablet Take 1 tablet (20 mg total) by mouth daily as needed (leg swelling). 06/13/16   [name], MD    Family History Family History  Problem Relation Age of Onset  . Sickle cell trait Mother   . Depression Mother   . Diabetes Mother   . Sickle cell trait Father   . Sickle cell trait Brother     Social History Social History  Substance Use Topics  . Smoking status: Former Smoker    Packs/day: 0.50    Years: 10.00    Types: Cigarettes    Quit date: 05/29/2011  . Smokeless tobacco: Never Used  . Alcohol use No     Allergies   Patient has no known allergies.   Review of Systems Review of Systems  Constitutional: Positive for activity change.  Respiratory: Negative for cough and shortness of breath.   Cardiovascular: Positive for chest pain.  Gastrointestinal: Negative for abdominal pain.  Musculoskeletal: Positive for arthralgias and back pain.  Allergic/Immunologic: Positive for immunocompromised state.  All other systems reviewed and are negative.    Physical Exam Updated Vital Signs BP 115/73 (BP Location: Left Arm)   Pulse 92   Temp 98.4 F (36.9 C) (Oral)   Resp 20   Wt 83.9  kg (185 lb)   SpO2 95%   BMI 25.09 kg/m   Physical Exam  Constitutional: He is oriented to person, place, and time. He appears well-developed.  HENT:  Head: Atraumatic.  Neck: Neck supple.  Cardiovascular: Normal rate.   Pulmonary/Chest: Effort normal. No respiratory distress.  Musculoskeletal:  Passive ROM and active ROM over the hips is normal.  Neurological: He is alert and oriented to person, place, and time.  Skin: Skin is warm.  Nursing note and vitals reviewed.    ED Treatments / Results  Labs (all labs ordered are listed, but only abnormal results  are displayed) Labs Reviewed  RETICULOCYTES - Abnormal; Notable for the following:       Result Value   Retic Ct Pct 7.4 (*)    RBC. 2.33 (*)    All other components within normal limits  CBC WITH DIFFERENTIAL/PLATELET - Abnormal; Notable for the following:    WBC 13.5 (*)    RBC 2.33 (*)    Hemoglobin 7.1 (*)    HCT 20.9 (*)    RDW 18.8 (*)    Platelets 415 (*)    Neutro Abs 9.6 (*)    Monocytes Absolute 2.3 (*)    All other components within normal limits  COMPREHENSIVE METABOLIC PANEL - Abnormal; Notable for the following:    Total Bilirubin 4.4 (*)    All other components within normal limits    EKG  EKG Interpretation None       Radiology No results found.  Procedures Procedures (including critical care time)  Medications Ordered in ED Medications  diphenhydrAMINE (BENADRYL) capsule 25-50 mg (25 mg Oral Given 06/22/16 1814)  HYDROmorphone (DILAUDID) injection 2 mg (2 mg Intravenous Given 06/22/16 1826)    Or  HYDROmorphone (DILAUDID) injection 2 mg ( Subcutaneous See Alternative 06/22/16 1826)  HYDROmorphone (DILAUDID) injection 2 mg (2 mg Intravenous Given 06/22/16 1900)    Or  HYDROmorphone (DILAUDID) injection 2 mg ( Subcutaneous See Alternative 06/22/16 1900)  HYDROmorphone (DILAUDID) injection 2 mg (2 mg Intravenous Given 06/22/16 1942)    Or  HYDROmorphone (DILAUDID) injection 2 mg (  Subcutaneous See Alternative 06/22/16 1942)  HYDROmorphone (DILAUDID) injection 2 mg (2 mg Intravenous Given 06/22/16 2131)  diphenhydrAMINE (BENADRYL) capsule 25 mg (25 mg Oral Given 06/22/16 2130)     Initial Impression / Assessment and Plan / ED Course  I have reviewed the triage vital signs and the nursing notes.  Pertinent labs & imaging results that were available during my care of the patient were reviewed by me and considered in my medical decision making (see chart for details).  Clinical Course as of Jun 22 2141  Wed Jun 22, 2016  2142 Pt reassessed and reports that pain is down to 4/10, and he almost feels that he can go home - so we gave 1 more round of iv meds, and pt feels comfortable going home. Ready for d/c. Pt has an appt with Dr. Doreene Burke tomorrow.  [AN]    Clinical Course User Index [AN] Varney Biles, MD    Pt comes in with sickle cell related pain.  VSS and WNL -  hemodynamically stable  Pain appears vaso-occlusive tpye and typical of previous pain. Appropriate labs ordered. Pain control with dilaudid started.  We will reassess patient after 3 doses. Goal is to break the pain and see if patient feels comfortable going home.  Currently, there is no signs of severe decompensation clinically. Will continue to monitor closely. If we are unable to control the pain, we will admit the patient.   Final Clinical Impressions(s) / ED Diagnoses   Final diagnoses:  Sickle cell anemia with crisis ([place])  Sickle cell pain crisis [facility])    New Prescriptions New Prescriptions   No medications on file     Varney Biles, MD 06/22/16 Martin, [place], MD 06/22/16 2143

## 2016-06-23 ENCOUNTER — Non-Acute Institutional Stay (HOSPITAL_BASED_OUTPATIENT_CLINIC_OR_DEPARTMENT_OTHER)
Admission: AD | Admit: 2016-06-23 | Discharge: 2016-06-23 | Disposition: A | Discharge status: Home / Self Care | Payer: Medicare Other | Source: Ambulatory Visit | Attending: Internal Medicine | Admitting: Internal Medicine

## 2016-06-23 ENCOUNTER — Telehealth (HOSPITAL_COMMUNITY): Payer: Self-pay | Admitting: *Deleted

## 2016-06-23 ENCOUNTER — Encounter (HOSPITAL_COMMUNITY): Payer: Self-pay | Admitting: *Deleted

## 2016-06-23 ENCOUNTER — Other Ambulatory Visit: Payer: Self-pay | Admitting: Family Medicine

## 2016-06-23 DIAGNOSIS — D5700 Hb-SS disease with crisis, unspecified: Secondary | ICD-10-CM | POA: Diagnosis not present

## 2016-06-23 DIAGNOSIS — Z87891 Personal history of nicotine dependence: Secondary | ICD-10-CM | POA: Insufficient documentation

## 2016-06-23 DIAGNOSIS — I10 Essential (primary) hypertension: Secondary | ICD-10-CM | POA: Diagnosis not present

## 2016-06-23 DIAGNOSIS — Z96641 Presence of right artificial hip joint: Secondary | ICD-10-CM | POA: Insufficient documentation

## 2016-06-23 DIAGNOSIS — Z86718 Personal history of other venous thrombosis and embolism: Secondary | ICD-10-CM

## 2016-06-23 DIAGNOSIS — Z79899 Other long term (current) drug therapy: Secondary | ICD-10-CM | POA: Insufficient documentation

## 2016-06-23 DIAGNOSIS — Z8673 Personal history of transient ischemic attack (TIA), and cerebral infarction without residual deficits: Secondary | ICD-10-CM

## 2016-06-23 DIAGNOSIS — Z79891 Long term (current) use of opiate analgesic: Secondary | ICD-10-CM

## 2016-06-23 DIAGNOSIS — Z86711 Personal history of pulmonary embolism: Secondary | ICD-10-CM | POA: Insufficient documentation

## 2016-06-23 DIAGNOSIS — Z7901 Long term (current) use of anticoagulants: Secondary | ICD-10-CM

## 2016-06-23 LAB — CBC WITH DIFFERENTIAL/PLATELET
Basophils Absolute: 0.1 10*3/uL (ref 0.0–0.1)
Basophils Relative: 1 %
EOS ABS: 0.2 10*3/uL (ref 0.0–0.7)
EOS PCT: 2 %
HCT: 24.2 % — ABNORMAL LOW (ref 39.0–52.0)
Hemoglobin: 8.3 g/dL — ABNORMAL LOW (ref 13.0–17.0)
Lymphocytes Relative: 12 %
Lymphs Abs: 1.6 10*3/uL (ref 0.7–4.0)
MCH: 30.9 pg (ref 26.0–34.0)
MCHC: 34.3 g/dL (ref 30.0–36.0)
MCV: 90 fL (ref 78.0–100.0)
MONOS PCT: 13 %
Monocytes Absolute: 1.7 10*3/uL — ABNORMAL HIGH (ref 0.1–1.0)
Neutro Abs: 9.7 10*3/uL — ABNORMAL HIGH (ref 1.7–7.7)
Neutrophils Relative %: 72 %
PLATELETS: 459 10*3/uL — AB (ref 150–400)
RBC: 2.69 MIL/uL — ABNORMAL LOW (ref 4.22–5.81)
RDW: 19.1 % — ABNORMAL HIGH (ref 11.5–15.5)
WBC: 13.3 10*3/uL — ABNORMAL HIGH (ref 4.0–10.5)

## 2016-06-23 LAB — COMPREHENSIVE METABOLIC PANEL
ALT: 25 U/L (ref 17–63)
ANION GAP: 7 (ref 5–15)
AST: 38 U/L (ref 15–41)
Albumin: 3.9 g/dL (ref 3.5–5.0)
Alkaline Phosphatase: 108 U/L (ref 38–126)
BUN: 9 mg/dL (ref 6–20)
CHLORIDE: 106 mmol/L (ref 101–111)
CO2: 24 mmol/L (ref 22–32)
Calcium: 8.7 mg/dL — ABNORMAL LOW (ref 8.9–10.3)
Creatinine, Ser: 0.84 mg/dL (ref 0.61–1.24)
Glucose, Bld: 109 mg/dL — ABNORMAL HIGH (ref 65–99)
Potassium: 3.6 mmol/L (ref 3.5–5.1)
SODIUM: 137 mmol/L (ref 135–145)
Total Bilirubin: 3.9 mg/dL — ABNORMAL HIGH (ref 0.3–1.2)
Total Protein: 7.4 g/dL (ref 6.5–8.1)

## 2016-06-23 LAB — RETICULOCYTES
RBC.: 2.69 MIL/uL — AB (ref 4.22–5.81)
RETIC COUNT ABSOLUTE: 244.8 10*3/uL — AB (ref 19.0–186.0)
RETIC CT PCT: 9.1 % — AB (ref 0.4–3.1)

## 2016-06-23 MED ORDER — SODIUM CHLORIDE 0.9% FLUSH: 9.0000 mL | INTRAVENOUS | Status: DC | PRN

## 2016-06-23 MED ORDER — RIVAROXABAN 20 MG PO TABS: 20.0000 mg | ORAL_TABLET | Freq: Every day | ORAL | 3 refills | Status: DC

## 2016-06-23 MED ORDER — HEPARIN SOD (PORK) LOCK FLUSH 100 UNIT/ML IV SOLN: 500.0000 [IU] | INTRAVENOUS | Status: DC | PRN

## 2016-06-23 MED ORDER — SODIUM CHLORIDE 0.9% FLUSH: 10.0000 mL | INTRAVENOUS | Status: DC | PRN

## 2016-06-23 MED ORDER — HYDROMORPHONE 1 MG/ML IV SOLN
INTRAVENOUS | Status: DC
  Administered 2016-06-23: 25 mg via INTRAVENOUS
  Administered 2016-06-23: 17.5 mg via INTRAVENOUS
  Filled 2016-06-23: qty 25

## 2016-06-23 MED ORDER — HEPARIN SOD (PORK) LOCK FLUSH 100 UNIT/ML IV SOLN
500.0000 [IU] | INTRAVENOUS | Status: AC | PRN
  Administered 2016-06-23: 500 [IU]
  Filled 2016-06-23: qty 5

## 2016-06-23 MED ORDER — SODIUM CHLORIDE 0.9% FLUSH
10.0000 mL | INTRAVENOUS | Status: AC | PRN
  Administered 2016-06-23: 10 mL

## 2016-06-23 MED ORDER — DEXTROSE-NACL 5-0.45 % IV SOLN
INTRAVENOUS | Status: DC
  Administered 2016-06-23: 10:00:00 via INTRAVENOUS

## 2016-06-23 MED ORDER — NALOXONE HCL 0.4 MG/ML IJ SOLN: 0.4000 mg | INTRAMUSCULAR | Status: DC | PRN

## 2016-06-23 NOTE — Discharge Instructions (Signed)
-  Continue to hydrate and take prescribed home medications as ordered. -Resume all home medications. -Keep upcoming appointment  -The patient was given clear instructions to go to ER or return to medical center if symptoms do not improve, worsen or new problems develop. The patient verbalized understanding.   Sickle Cell Anemia, Adult Sickle cell anemia is a condition where your red blood cells are shaped like sickles. Red blood cells carry oxygen through the body. Sickle-shaped red blood cells do not live as long as normal red blood cells. They also clump together and block blood from flowing through the blood vessels. These things prevent the body from getting enough oxygen. Sickle cell anemia causes organ damage and pain. It also increases the risk of infection. Follow these instructions at home:  Drink enough fluid to keep your pee (urine) clear or pale yellow. Drink more in hot weather and during exercise.  Do not smoke. Smoking lowers oxygen levels in the blood.  Only take over-the-counter or prescription medicines as told by your doctor.  Take antibiotic medicines as told by your doctor. Make sure you finish them even if you start to feel better.  Take supplements as told by your doctor.  Consider wearing a medical alert bracelet. This tells anyone caring for you in an emergency of your condition.  When traveling, keep your medical information, doctors' names, and the medicines you take with you at all times.  If you have a fever, do not take fever medicines right away. This could cover up a problem. Tell your doctor.  Keep all follow-up visits with your doctor. Sickle cell anemia requires regular medical care. Contact a doctor if: You have a fever. Get help right away if:  You feel dizzy or faint.  You have new belly (abdominal) pain, especially on the left side near the stomach area.  You have a lasting, often uncomfortable and painful erection of the penis (priapism). If  it is not treated right away, you will become unable to have sex (impotence).  You have numbness in your arms or legs or you have a hard time moving them.  You have a hard time talking.  You have a fever or lasting symptoms for more than 2-3 days.  You have a fever and your symptoms suddenly get worse.  You have signs or symptoms of infection. These include:  Chills.  Being more tired than normal (lethargy).  Irritability.  Poor eating.  Throwing up (vomiting).  You have pain that is not helped with medicine.  You have shortness of breath.  You have pain in your chest.  You are coughing up pus-like or bloody mucus.  You have a stiff neck.  Your feet or hands swell or have pain.  Your belly looks bloated.  Your joints hurt. This information is not intended to replace advice given to you by your health care provider. Make sure you discuss any questions you have with your health care provider. Document Released: 11/07/2012 Document Revised: 06/25/2015 Document Reviewed: 08/29/2012 Elsevier Interactive Patient Education  2017 [employer].

## 2016-06-23 NOTE — Telephone Encounter (Signed)
Patient called requesting to come to the [facility] for treatment. Pt states his pain is in his back, legs and rib cage. He last took his MS Contin at 6:30 this morning and also his short acting meds around the clock. He denies fever, chest pain, abd pain, diarrhea, nausea, vomiting or priapism. Pt was placed on hold while speaking with the provider.  After speaking with the provider, pt was told that he could come in for treatment. Pt voiced understanding.

## 2016-06-23 NOTE — Progress Notes (Signed)
Pt received to the [facility] for treatment. Pt's pain was an 8/10 on admission and down to 4/10 at discharge. He was treated with rest, IV fluids, and Dilaudid PCA. Pt was alert and oriented at discharge. He received d/c instructions with verbal understanding and left with his brother.

## 2016-06-23 NOTE — Discharge Summary (Signed)
[facility] Discharge Summary   Patient ID: [name] MRN: [id number] DOB/AGE: [birth year] 37 y.o.  Admit date: 06/23/2016 Discharge date: 06/23/2016  Primary Care Physician:  Tresa Garter, MD  Admission Diagnoses:  Active Problems:   Essential hypertension   Sickle cell crisis [facility])   Discharge Medications:  Allergies as of 06/23/2016   No Known Allergies    Current Meds  Medication Sig  . carvedilol (COREG) 3.125 MG tablet Take 1 tablet (3.125 mg total) by mouth 2 (two) times daily with a meal.  . cholecalciferol (VITAMIN D) 1000 units tablet Take 2,000 Units by mouth daily.  . folic acid (FOLVITE) 1 MG tablet Take 2 tablets (2 mg total) by mouth daily.  Marland Kitchen gabapentin (NEURONTIN) 300 MG capsule Take 1 capsule (300 mg total) by mouth 3 (three) times daily.  Marland Kitchen LETAIRIS 5 MG tablet Take 5 mg by mouth at bedtime.   Marland Kitchen lisinopril (PRINIVIL,ZESTRIL) 2.5 MG tablet Take 2.5 mg by mouth at bedtime.   Marland Kitchen morphine (MS CONTIN) 30 MG 12 hr tablet Take 1 tablet (30 mg total) by mouth every 12 (twelve) hours.  . potassium chloride SA (K-DUR,KLOR-CON) 20 MEQ tablet Take 2 tablets (40 mEq total) by mouth daily.  Marland Kitchen zolpidem (AMBIEN) 10 MG tablet Take 1 tablet (10 mg total) by mouth at bedtime as needed for sleep. (Patient taking differently: Take 10 mg by mouth at bedtime. )  . [DISCONTINUED] rivaroxaban (XARELTO) 20 MG TABS tablet Take 1 tablet (20 mg total) by mouth at bedtime.    Consults:  N/A  Significant Diagnostic Studies:  Dg Chest 2 View  Result Date: 06/06/2016 CLINICAL DATA:  Rib pain. Shortness of breath today. History of sickle cell anemia. EXAM: CHEST  2 VIEW COMPARISON:  Radiographs 05/28/2016, most recent chest CT 05/06/2016 FINDINGS: Left chest port remains in place. Cardiomegaly is similar to prior. Question of worsening vascular congestion versus differences in AP technique. No definite pleural effusion. No focal airspace disease. No pneumothorax. No  acute rib abnormalities identified radiographically. IMPRESSION: Similar cardiomegaly. Question of worsening vascular congestion versus differences in technique. No new focal airspace disease. Electronically Signed   By: Jeb Levering M.D.   On: 06/06/2016 18:32   Dg Chest 2 View  Result Date: 05/28/2016 CLINICAL DATA:  36 year old male with sickle cell anemia presenting with bilateral rib pain. EXAM: CHEST  2 VIEW COMPARISON:  Chest radiograph dated 05/14/2016 FINDINGS: Left pectoral subclavian approach infusion catheter with tip over the right atrial silhouette adjacent to the cavoatrial junction. There is stable cardiomegaly with mild vascular congestion. No new consolidative changes. There is no pleural effusion or pneumothorax. No acute osseous pathology. IMPRESSION: 1. No focal consolidation. 2. Cardiomegaly with probable mild vascular congestion. Electronically Signed   By: Anner Crete M.D.   On: 05/28/2016 04:44     [facility] Course: [name] is a 36 y.o. male with a diagnosis of Sickle Cell Anemia, type HB-SS, presented today with a complaint of pain in bilateral sides, back, and legs since last evening. He was treated at [facility] emergency department on yesterday for sickle cell pain. Today, he presented to the day infusion center with a pain intensity as 9/10, characterized as persisted aching pain.  Syrus was admitted for extended observation into the day infusion center.  The following was his course of treatment: Intravenous D5.45 @ 125 cc/hr administer for cellular rehydration. Placed on a High Concentration PCA per weight based protocol for  pain control. Patient received  a total of 17.5 mg with 36 demands and 35 deliveries. Patient states that pain intensity is 4/10, reduced from 9/10 on admission. Reviewed laboratory values, consistent with baseline. Patient is alert, oriented and ambulatory.   Physical Exam at Discharge: BP 104/64 (BP  Location: Left Arm)   Pulse 81   Temp 98.6 F (37 C) (Oral)   Resp (!) 22   Ht 6' (1.829 m)   Wt 185 lb (83.9 kg)   SpO2 96%   BMI 25.09 kg/m   General Appearance:    Alert, cooperative, no distress, appears stated age  Head:    Normocephalic, without obvious abnormality, atraumatic  Eyes:    PERRL, conjunctiva/corneas clear, EOM's intact  Back:     Symmetric, no curvature, ROM normal, no CVA tenderness  Lungs:     Clear to auscultation bilaterally, respirations unlabored  Chest Wall:    No tenderness or deformity   Heart:    Regular rate and rhythm, S1 and S2 normal, no murmur, rub   or gallop  Abdomen:     Soft, non-tender, bowel sounds active all four quadrants,    no masses, no organomegaly  Extremities:   Extremities normal, atraumatic, no cyanosis or edema  Pulses:   2+ and symmetric all extremities  Skin:   Skin color, texture, turgor normal, no rashes or lesions  Neurologic:   Normal strength, sensation     Disposition at Discharge: 01-Home or Self Care  Discharge Orders: -Continue to hydrate and take prescribed home medications as ordered. -Resume all home medications. -Keep upcoming appointment  -The patient was given clear instructions to go to ER or return to medical center if symptoms do not improve, worsen or new problems develop. The patient verbalized understanding.   Condition at Discharge:   Stable  Time spent on Discharge:  Greater than 30 minutes.  Signed: Molli Barrows 06/23/2016, 6:22 PM

## 2016-06-23 NOTE — H&P (Signed)
[facility] History and Physical   Date: 06/23/2016  Patient name: [name] Medical record number: [id number] Date of birth: 31-Mar-1980 Age: 36 y.o. Gender: male PCP: Tresa Garter, MD  Attending physician: Tresa Garter, MD  Chief Complaint: Pain   History of Present Illness: [name] is a 36 y.o. male with a diagnosis of Sickle Cell Anemia, type HB-SS, presents today with a complaint of pain in bilateral sides, back, and legs since last evening. He presented to the emergency department on yesterday and was treated for Sickle Cell related pain. He reports pain intensity as 9/10. Pain is characterized as aching. He last took pain medication this morning and reports minimal relief. He has had several emergency department and day infusion center encounters related to sickle cell pain and 2 hospital admissions related to sickle cell crisis. At present, patient denies headache, fever, shortness of breath, chest pain, dysuria, nausea, vomiting, or diarrhea.     Meds: Prescriptions Prior to Admission  Medication Sig Dispense Refill Last Dose  . carvedilol (COREG) 3.125 MG tablet Take 1 tablet (3.125 mg total) by mouth 2 (two) times daily with a meal.   06/22/2016 at Unknown time  . cholecalciferol (VITAMIN D) 1000 units tablet Take 2,000 Units by mouth daily.   06/22/2016 at Unknown time  . folic acid (FOLVITE) 1 MG tablet Take 2 tablets (2 mg total) by mouth daily.   06/22/2016 at Unknown time  . gabapentin (NEURONTIN) 300 MG capsule Take 1 capsule (300 mg total) by mouth 3 (three) times daily. 270 capsule 3 06/22/2016 at Unknown time  . LETAIRIS 5 MG tablet Take 5 mg by mouth at bedtime.    06/22/2016 at Unknown time  . lisinopril (PRINIVIL,ZESTRIL) 2.5 MG tablet Take 2.5 mg by mouth at bedtime.    06/22/2016 at Unknown time  . morphine (MS CONTIN) 30 MG 12 hr tablet Take 1 tablet (30 mg total) by mouth every 12 (twelve) hours. 60 tablet 0 06/23/2016 at  Unknown time  . potassium chloride SA (K-DUR,KLOR-CON) 20 MEQ tablet Take 2 tablets (40 mEq total) by mouth daily. 30 tablet 2 06/22/2016 at Unknown time  . rivaroxaban (XARELTO) 20 MG TABS tablet Take 1 tablet (20 mg total) by mouth at bedtime. 30 tablet 3 06/22/2016 at Unknown time  . zolpidem (AMBIEN) 10 MG tablet Take 1 tablet (10 mg total) by mouth at bedtime as needed for sleep. (Patient taking differently: Take 10 mg by mouth at bedtime. ) 30 tablet 0 06/22/2016 at Unknown time  . L-glutamine (ENDARI) 5 g PACK Powder Packet Take 15 g by mouth 2 (two) times daily. (Patient not taking: Reported on 06/22/2016) 180 packet 1 Not Taking at Unknown time  . torsemide (DEMADEX) 20 MG tablet Take 1 tablet (20 mg total) by mouth daily as needed (leg swelling).   Unknown at Unknown time    Allergies: Patient has no known allergies. Past Medical History:  Diagnosis Date  . Acute chest syndrome ([place]) 06/18/2013  . Acute embolism and thrombosis of right internal jugular vein ([place])   . Alcohol consumption of one to four drinks per day   . Avascular necrosis (H[facility])    Right Hip  . Blood transfusion   . Chronic anticoagulation   . Demand ischemia ([place]) 01/02/2014  . Former smoker   . Functional asplenia   . Hb-SS disease with crisis ([place])   . History of Clostridium difficile infection   . History of pulmonary embolus (  PE)   . Hypertension   . Hypokalemia   . Leukocytosis    Chronic  . Mood disorder ([place])   . Noncompliance with medication regimen   . Oxygen deficiency   . Pulmonary hypertension ([place])   . Second hand tobacco smoke exposure   . Sickle cell anemia (HCC)   . Sickle-cell crisis with associated acute chest syndrome ([place]) 05/13/2013  . Stroke ([place])   . Thrombocytosis (HCC)    Chronic  . Uses marijuana    Past Surgical History:  Procedure Laterality Date  . CHOLECYSTECTOMY     01/2008  . Excision of left periauricular cyst     10/2009  . Excision of right ear lobe cyst with primary  closur     11/2007  . Porta cath placement    . Porta cath removal    . PORTACATH PLACEMENT  01/05/2012   Procedure: INSERTION PORT-A-CATH;  Surgeon: Odis Hollingshead, MD;  Location: [facility];  Service: General;  Laterality: N/A;  ultrasound guiced port a cath insertion with fluoroscopy  . Right hip replacement     08/2006  . UMBILICAL HERNIA REPAIR     01/2008   Family History  Problem Relation Age of Onset  . Sickle cell trait Mother   . Depression Mother   . Diabetes Mother   . Sickle cell trait Father   . Sickle cell trait Brother    Social History   Social History  . Marital status: Single    Spouse name: N/A  . Number of children: 0  . Years of education: 38   Occupational History  . 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 Smoking status: Former Smoker    Packs/day: 0.50    Years: 10.00    Types: Cigarettes    Quit date: 05/29/2011  . Smokeless tobacco: Never Used  . Alcohol use No  . Drug use: No     Comment: Marijuana weekly  . Sexual activity: Yes    Partners: Female     Comment: month ago   Other Topics Concern  . Not on file   Social History Narrative   Lives in an apartment.  Single.  Lives alone but has a girlfriend that helps care for him.  Does not use any assist devices.        [name]:  [phone] Mom, emergency contact      [facility]:   Patient continuing to live in her apartment in town alone. 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    Review of Systems: Cardiovascular: negative Gastrointestinal: negative Musculoskeletal:positive for arthralgias Neurological: negative  Physical Exam: Blood pressure 103/69, pulse 84, temperature 98.6 F (37 C), temperature source Oral, resp. rate 20, height 6' (1.829 m), weight 185 lb (83.9 kg), SpO2 97 %.   General Appearance:      Alert, cooperative, no distress, appears stated age  Head:    Normocephalic, without obvious  abnormality, atraumatic  Eyes:    PERRL, conjunctiva/corneas clear, EOM's intact  Back:     Symmetric, no curvature, ROM normal, no CVA tenderness  Lungs:     Clear to auscultation bilaterally, respirations unlabored  Chest Wall:    No tenderness or deformity   Heart:    Regular rate and rhythm, S1 and S2 normal, no murmur, rub   or gallop  Abdomen:     Soft, non-tender, bowel sounds active  all four quadrants,    no masses, no organomegaly  Extremities:   Extremities normal, atraumatic, no cyanosis or edema  Pulses:   2+ and symmetric all extremities  Skin:   Skin color, texture, turgor normal, no rashes or lesions  Neurologic:   Normal strength, sensation          Lab results: Pending   Imaging results:  n/a  Assessment & Plan:  Patient will be admitted to the day infusion center for extended observation  Start IV D5.45 for cellular rehydration at 125/hr  Toradol 30 mg was not administered as patient is on chronic anticoagulant therapy.  Start Dilaudid PCA High Concentration per weight based protocol.   Patient will be re-evaluated for pain intensity in the context of function and relationship to baseline as care progresses.  If no significant pain relief, will transfer patient to inpatient services for a higher level of care.   Will check CMP, CBC w/differential, and Reticulocytes  Molli Barrows 06/23/2016, 9:12 AM

## 2016-06-24 ENCOUNTER — Encounter (HOSPITAL_COMMUNITY): Payer: Self-pay | Admitting: Emergency Medicine

## 2016-06-24 ENCOUNTER — Emergency Department (HOSPITAL_COMMUNITY)
Admission: EM | Admit: 2016-06-24 | Discharge: 2016-06-24 | Disposition: A | Discharge status: Home / Self Care | Payer: Medicare Other | Source: Home / Self Care | Attending: Emergency Medicine | Admitting: Emergency Medicine

## 2016-06-24 DIAGNOSIS — Z7901 Long term (current) use of anticoagulants: Secondary | ICD-10-CM | POA: Insufficient documentation

## 2016-06-24 DIAGNOSIS — D57219 Sickle-cell/Hb-C disease with crisis, unspecified: Secondary | ICD-10-CM

## 2016-06-24 DIAGNOSIS — D5700 Hb-SS disease with crisis, unspecified: Secondary | ICD-10-CM | POA: Diagnosis not present

## 2016-06-24 DIAGNOSIS — Z87891 Personal history of nicotine dependence: Secondary | ICD-10-CM

## 2016-06-24 DIAGNOSIS — I10 Essential (primary) hypertension: Secondary | ICD-10-CM

## 2016-06-24 LAB — CBC WITH DIFFERENTIAL/PLATELET
Basophils Absolute: 0.1 10*3/uL (ref 0.0–0.1)
Basophils Relative: 1 %
EOS PCT: 2 %
Eosinophils Absolute: 0.3 10*3/uL (ref 0.0–0.7)
HCT: 25.8 % — ABNORMAL LOW (ref 39.0–52.0)
Hemoglobin: 8.4 g/dL — ABNORMAL LOW (ref 13.0–17.0)
LYMPHS ABS: 1.9 10*3/uL (ref 0.7–4.0)
Lymphocytes Relative: 16 %
MCH: 29.9 pg (ref 26.0–34.0)
MCHC: 32.6 g/dL (ref 30.0–36.0)
MCV: 91.8 fL (ref 78.0–100.0)
MONO ABS: 1.8 10*3/uL — AB (ref 0.1–1.0)
Monocytes Relative: 15 %
Neutro Abs: 7.8 10*3/uL — ABNORMAL HIGH (ref 1.7–7.7)
Neutrophils Relative %: 66 %
Platelets: 493 10*3/uL — ABNORMAL HIGH (ref 150–400)
RBC: 2.81 MIL/uL — AB (ref 4.22–5.81)
RDW: 19.8 % — AB (ref 11.5–15.5)
WBC: 11.9 10*3/uL — AB (ref 4.0–10.5)

## 2016-06-24 LAB — COMPREHENSIVE METABOLIC PANEL
ALT: 23 U/L (ref 17–63)
AST: 33 U/L (ref 15–41)
Albumin: 4.2 g/dL (ref 3.5–5.0)
Alkaline Phosphatase: 92 U/L (ref 38–126)
Anion gap: 7 (ref 5–15)
BILIRUBIN TOTAL: 4.3 mg/dL — AB (ref 0.3–1.2)
BUN: 6 mg/dL (ref 6–20)
CHLORIDE: 108 mmol/L (ref 101–111)
CO2: 23 mmol/L (ref 22–32)
CREATININE: 0.68 mg/dL (ref 0.61–1.24)
Calcium: 9 mg/dL (ref 8.9–10.3)
GFR calc Af Amer: >60 mL/min (ref >60)
Glucose, Bld: 102 mg/dL — ABNORMAL HIGH (ref 65–99)
Potassium: 3.5 mmol/L (ref 3.5–5.1)
Sodium: 138 mmol/L (ref 135–145)
Total Protein: 7.5 g/dL (ref 6.5–8.1)

## 2016-06-24 LAB — RETICULOCYTES
RBC.: 2.81 MIL/uL — ABNORMAL LOW (ref 4.22–5.81)
RETIC CT PCT: 13.1 % — AB (ref 0.4–3.1)
Retic Count, Absolute: 368.1 10*3/uL — ABNORMAL HIGH (ref 19.0–186.0)

## 2016-06-24 LAB — I-STAT TROPONIN, ED: Troponin i, poc: 0 ng/mL (ref 0.00–0.08)

## 2016-06-24 MED ORDER — HEPARIN SOD (PORK) LOCK FLUSH 100 UNIT/ML IV SOLN
500.0000 [IU] | Freq: Once | INTRAVENOUS | Status: AC
  Administered 2016-06-24: 500 [IU]
  Filled 2016-06-24: qty 5

## 2016-06-24 MED ORDER — HYDROMORPHONE HCL 1 MG/ML IJ SOLN: 2.0000 mg | INTRAMUSCULAR | Status: AC

## 2016-06-24 MED ORDER — HYDROMORPHONE HCL 1 MG/ML IJ SOLN
2.0000 mg | INTRAMUSCULAR | Status: AC
  Administered 2016-06-24: 2 mg via INTRAVENOUS
  Filled 2016-06-24: qty 2

## 2016-06-24 MED ORDER — DIPHENHYDRAMINE HCL 50 MG/ML IJ SOLN
12.5000 mg | Freq: Once | INTRAMUSCULAR | Status: AC
  Administered 2016-06-24: 12.5 mg via INTRAVENOUS
  Filled 2016-06-24: qty 1

## 2016-06-24 MED ORDER — DIPHENHYDRAMINE HCL 50 MG/ML IJ SOLN
25.0000 mg | Freq: Once | INTRAMUSCULAR | Status: AC
  Administered 2016-06-24: 25 mg via INTRAVENOUS
  Filled 2016-06-24 (×2): qty 1

## 2016-06-24 MED ORDER — DEXTROSE-NACL 5-0.45 % IV SOLN
INTRAVENOUS | Status: DC
  Administered 2016-06-24: 17:00:00 via INTRAVENOUS

## 2016-06-24 NOTE — Discharge Instructions (Signed)
Please follow up with sickle cell clinic regarding today's visit.  Get help right away if: You feel dizzy or faint. You have new abdominal pain, especially on the left side near the stomach area. You develop a persistent, often uncomfortable and painful penile erection (priapism). If this is not treated immediately it will lead to impotence. You have numbness your arms or legs or you have a hard time moving them. You have a hard time with speech. You have a fever or persistent symptoms for more than 2-3 days. You have a fever and your symptoms suddenly get worse. You have signs or symptoms of infection. These include: Chills. Abnormal tiredness (lethargy). Irritability. Poor eating. Vomiting. You develop pain that is not helped with medicine. You develop shortness of breath. You have pain in your chest. You are coughing up pus-like or bloody sputum. You develop a stiff neck. Your feet or hands swell or have pain. Your abdomen appears bloated. You develop joint pain.

## 2016-06-24 NOTE — ED Provider Notes (Signed)
[facility] DEPT Provider Note   CSN: [id number] Arrival date & time: 06/24/16  1605     History   Chief Complaint Chief Complaint  Patient presents with  . Sickle Cell Pain Crisis  . Back Pain    HPI [name] is a 36 y.o. male with PMHx of sickle cell anemia and crisis presents today with complaints of severe pain to his lateral lower rib cage bilaterally and lower back for 3 days. He reports it is worsening, sharp, 8-9/10 and similar to his previous sickle cell pain. He reports trying morphine and other pain medication at home with no resolution. He denies any chest pain, SOB, fevers, chills, HA, abdominal pain, urinary symptoms.   The history is provided by the patient. No language interpreter was used.  Sickle Cell Pain Crisis  Associated symptoms: no chest pain, no fever, no nausea, no shortness of breath and no vomiting   Back Pain   Pertinent negatives include no chest pain, no fever, no abdominal pain and no dysuria.    Past Medical History:  Diagnosis Date  . Acute chest syndrome ([place]) 06/18/2013  . Acute embolism and thrombosis of right internal jugular vein ([place])   . Alcohol consumption of one to four drinks per day   . Avascular necrosis (HCC)    Right Hip  . Blood transfusion   . Chronic anticoagulation   . Demand ischemia ([place]) 01/02/2014  . Former smoker   . Functional asplenia   . Hb-SS disease with crisis ([place])   . History of Clostridium difficile infection   . History of pulmonary embolus (PE)   . Hypertension   . Hypokalemia   . Leukocytosis    Chronic  . Mood disorder ([place])   . Noncompliance with medication regimen   . Oxygen deficiency   . Pulmonary hypertension ([place])   . Second hand tobacco smoke exposure   . Sickle cell anemia (HCC)   . Sickle-cell crisis with associated acute chest syndrome ([place]) 05/13/2013  . Stroke ([place])   . Thrombocytosis (HCC)    Chronic  . Uses marijuana     Patient Active Problem List   Diagnosis Date  Noted  . Sickle cell crisis ([place]) 02/23/2016  . H/O arterial ischemic stroke 11/11/2015  . Slurred speech 11/10/2015  . Chronic anemia   . Hb-SS disease without crisis ([place]) 10/07/2014  . Prolonged Q-T interval on ECG 09/10/2014  . Troponin level elevated 08/29/2014  . Cor pulmonale, chronic ([place]) 08/25/2014  . Sickle cell anemia ([place]) 06/25/2014  . Anemia of chronic disease 06/25/2014  . PAH (pulmonary artery hypertension) ([place]) 03/18/2014  . Chronic respiratory failure with hypoxia ([place]) 03/14/2014  . Paralytic strabismus, external ophthalmoplegia   . Chronic pain syndrome 12/12/2013  . Chronic anticoagulation 08/22/2013  . Essential hypertension 08/22/2013  . Pulmonary HTN ([place]) 06/18/2013  . Functional asplenia   . Vitamin D deficiency 02/13/2013  . Hx of pulmonary embolus 06/29/2012  . 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 Avascular necrosis [facility])     Past Surgical History:  Procedure Laterality Date  . CHOLECYSTECTOMY     01/2008  . Excision of left periauricular cyst     10/2009  . Excision of right ear lobe cyst with primary closur     11/2007  . Porta cath placement    . Porta cath removal    . PORTACATH PLACEMENT  01/05/2012   Procedure: INSERTION PORT-A-CATH;  Surgeon: Odis Hollingshead, MD;  Location: [facility];  Service: General;  Laterality: N/A;  ultrasound guiced port a cath insertion with fluoroscopy  . Right hip replacement     08/2006  . UMBILICAL HERNIA REPAIR     01/2008       Home Medications    Prior to Admission medications   Medication Sig Start Date End Date Taking? Authorizing Provider  carvedilol (COREG) 3.125 MG tablet Take 1 tablet (3.125 mg total) by mouth 2 (two) times daily with a meal. 05/17/16  Yes [name], MD  cholecalciferol (VITAMIN D) 1000 units tablet Take 2,000 Units by mouth daily.   Yes [provider]  folic acid (FOLVITE) 1 MG tablet Take 2 tablets (2 mg total) by mouth daily. 04/25/16  Yes [name], MD  gabapentin (NEURONTIN) 300 MG capsule Take 1 capsule (300 mg total) by mouth 3 (three) times daily. 06/01/15  Yes Jegede, Olugbemiga E, MD  LETAIRIS 5 MG tablet Take 5 mg by mouth at bedtime.  06/15/16  Yes [provider]  lisinopril (PRINIVIL,ZESTRIL) 2.5 MG tablet Take 2.5 mg by mouth at bedtime.  05/22/16  Yes [provider]  morphine (MS CONTIN) 30 MG 12 hr tablet Take 1 tablet (30 mg total) by mouth every 12 (twelve) hours. 06/02/16 07/02/16 Yes Jegede, Marlena Clipper, MD  potassium chloride SA (K-DUR,KLOR-CON) 20 MEQ tablet Take 2 tablets (40 mEq total) by mouth daily. 04/27/16  Yes [name], MD  rivaroxaban (XARELTO) 20 MG TABS tablet Take 1 tablet (20 mg total) by mouth at bedtime. 06/23/16  Yes Dorena Dew, FNP  zolpidem (AMBIEN) 10 MG tablet Take 1 tablet (10 mg total) by mouth at bedtime as needed for sleep. Patient taking differently: Take 10 mg by mouth at bedtime.  06/02/16 07/02/16 Yes Jegede, Marlena Clipper, MD  L-glutamine (ENDARI) 5 g PACK Powder Packet Take 15 g by mouth 2 (two) times daily. Patient not taking: Reported on 06/22/2016 04/25/16   [name], MD  torsemide (DEMADEX) 20 MG tablet Take 1 tablet (20 mg total) by mouth daily as needed (leg swelling). 06/13/16   [name], MD    Family History Family History  Problem Relation Age of Onset  . Sickle cell trait Mother   . Depression Mother   . Diabetes Mother   . Sickle cell trait Father   . Sickle cell trait Brother     Social History Social History  Substance Use Topics  . Smoking status: Former Smoker    Packs/day: 0.50    Years: 10.00    Types: Cigarettes    Quit date: 05/29/2011  . Smokeless tobacco: Never Used  . Alcohol use No     Allergies   Patient has no known allergies.   Review of Systems Review of Systems  Constitutional: Negative for chills and fever.  Respiratory: Negative for shortness of breath.   Cardiovascular: Negative for  chest pain.  Gastrointestinal: Negative for abdominal pain, diarrhea, nausea and vomiting.  Genitourinary: Negative for difficulty urinating and dysuria.  Musculoskeletal: Positive for arthralgias and back pain.  Skin: Negative for wound.  All other systems reviewed and are negative.    Physical Exam Updated Vital Signs BP 110/72 (BP Location: Right Arm)   Pulse 85   Temp 98.1 F (36.7 C) (Oral)   Resp 16   SpO2 98%   Physical Exam  Constitutional: He appears well-developed and well-nourished.  Well appearing  HENT:  Head: Normocephalic and atraumatic.  Nose: Nose normal.  Mouth/Throat: Oropharynx is clear  and moist.  Eyes: Conjunctivae and EOM are normal.  Neck: Normal range of motion.  Cardiovascular: Normal rate, normal heart sounds and intact distal pulses.   No murmur heard. Pulmonary/Chest: Effort normal and breath sounds normal. No respiratory distress. He has no wheezes. He has no rales.  Normal work of breathing. No respiratory distress noted.   Abdominal: Soft. Bowel sounds are normal. There is no tenderness. There is no rebound and no guarding.  Musculoskeletal: Normal range of motion. He exhibits no tenderness.  Neurological: He is alert.  Skin: Skin is warm. Capillary refill takes less than 2 seconds.  Psychiatric: He has a normal mood and affect. His behavior is normal.  Nursing note and vitals reviewed.    ED Treatments / Results  Labs (all labs ordered are listed, but only abnormal results are displayed) Labs Reviewed  COMPREHENSIVE METABOLIC PANEL - Abnormal; Notable for the following:       Result Value   Glucose, Bld 102 (*)    Total Bilirubin 4.3 (*)    All other components within normal limits  CBC WITH DIFFERENTIAL/PLATELET - Abnormal; Notable for the following:    WBC 11.9 (*)    RBC 2.81 (*)    Hemoglobin 8.4 (*)    HCT 25.8 (*)    RDW 19.8 (*)    Platelets 493 (*)    Neutro Abs 7.8 (*)    Monocytes Absolute 1.8 (*)    All other  components within normal limits  RETICULOCYTES - Abnormal; Notable for the following:    Retic Ct Pct 13.1 (*)    RBC. 2.81 (*)    Retic Count, Manual 368.1 (*)    All other components within normal limits  I-STAT TROPOININ, ED    EKG  EKG Interpretation  Date/Time:  Friday Jun 24 2016 18:45:26 EDT Ventricular Rate:  80 PR Interval:    QRS Duration: 125 QT Interval:  412 QTC Calculation: 476 R Axis:   -109 Text Interpretation:  Sinus rhythm Borderline prolonged PR interval Left atrial enlargement Nonspecific IVCD with LAD Inferior infarct, old Probable anterior infarct, age indeterminate No significant change since last tracing Confirmed by Blanchie Dessert [phone]) on 06/24/2016 6:49:33 PM       Radiology No results found.  Procedures Procedures (including critical care time)  Medications Ordered in ED Medications  dextrose 5 %-0.45 % sodium chloride infusion ( Intravenous New Bag/Given 06/24/16 1713)  diphenhydrAMINE (BENADRYL) injection 25 mg (25 mg Intravenous Given 06/24/16 1717)  HYDROmorphone (DILAUDID) injection 2 mg (2 mg Intravenous Given 06/24/16 1713)    Or  HYDROmorphone (DILAUDID) injection 2 mg ( Subcutaneous See Alternative 06/24/16 1713)  HYDROmorphone (DILAUDID) injection 2 mg (2 mg Intravenous Given 06/24/16 1749)    Or  HYDROmorphone (DILAUDID) injection 2 mg ( Subcutaneous See Alternative 06/24/16 1749)  HYDROmorphone (DILAUDID) injection 2 mg (2 mg Intravenous Given 06/24/16 1836)    Or  HYDROmorphone (DILAUDID) injection 2 mg ( Subcutaneous See Alternative 06/24/16 1836)  diphenhydrAMINE (BENADRYL) injection 12.5 mg (12.5 mg Intravenous Given 06/24/16 1841)     Initial Impression / Assessment and Plan / ED Course  I have reviewed the triage vital signs and the nursing notes.  Pertinent labs & imaging results that were available during my care of the patient were reviewed by me and considered in my medical decision making (see chart for details).      Well known to ED. Pt here with his typical sickle cell crisis pain. He is hemodynamically stable afebrile.  No chest pain or SOB. Lab work is similar to previous. EKGs with no acute findings and no evidence of QT prolongation.   Pt states he felt 5/10 after 2nd dose of dilaudid and felt he was okay for discharge after his 3rd dose. Pt is to follow up with his PCP and sickle cell clinic regarding today's visit. Pt told to try sickle cell clinic before ED if he felt his typical sickle cell crisis. Pt verbally understood and is in agreement with assessment and plan. Pt is in NAD, afebrile, and hemodynamically stable. Reasons to immediately return to ED discussed.  Pt seen and evaluated by Dr. Maryan Rued who agreed with assessment and plan.   Final Clinical Impressions(s) / ED Diagnoses   Final diagnoses:  Sickle cell pain crisis [facility])    New Prescriptions New Prescriptions   No medications on file     Bettey Costa, [place] 06/24/16 1859    Blanchie Dessert, MD 06/24/16 [phone]

## 2016-06-24 NOTE — ED Triage Notes (Signed)
Patient c/o sickle cell pain on bilat sides and lower back for 2 days.

## 2016-06-25 ENCOUNTER — Encounter (HOSPITAL_COMMUNITY): Payer: Self-pay

## 2016-06-25 ENCOUNTER — Inpatient Hospital Stay (HOSPITAL_COMMUNITY)
Admission: EM | Admit: 2016-06-25 | Discharge: 2016-07-02 | DRG: 811 | Disposition: A | Discharge status: Home / Self Care | Payer: Medicare Other | Attending: Internal Medicine | Admitting: Internal Medicine

## 2016-06-25 DIAGNOSIS — R918 Other nonspecific abnormal finding of lung field: Secondary | ICD-10-CM | POA: Diagnosis not present

## 2016-06-25 DIAGNOSIS — Z8673 Personal history of transient ischemic attack (TIA), and cerebral infarction without residual deficits: Secondary | ICD-10-CM | POA: Diagnosis not present

## 2016-06-25 DIAGNOSIS — Z9981 Dependence on supplemental oxygen: Secondary | ICD-10-CM | POA: Diagnosis not present

## 2016-06-25 DIAGNOSIS — R0902 Hypoxemia: Secondary | ICD-10-CM

## 2016-06-25 DIAGNOSIS — I2781 Cor pulmonale (chronic): Secondary | ICD-10-CM | POA: Diagnosis present

## 2016-06-25 DIAGNOSIS — G894 Chronic pain syndrome: Secondary | ICD-10-CM | POA: Diagnosis not present

## 2016-06-25 DIAGNOSIS — Z7901 Long term (current) use of anticoagulants: Secondary | ICD-10-CM | POA: Diagnosis not present

## 2016-06-25 DIAGNOSIS — D5701 Hb-SS disease with acute chest syndrome: Secondary | ICD-10-CM | POA: Diagnosis not present

## 2016-06-25 DIAGNOSIS — Z9114 Patient's other noncompliance with medication regimen: Secondary | ICD-10-CM | POA: Diagnosis not present

## 2016-06-25 DIAGNOSIS — R0789 Other chest pain: Secondary | ICD-10-CM | POA: Diagnosis not present

## 2016-06-25 DIAGNOSIS — D5700 Hb-SS disease with crisis, unspecified: Secondary | ICD-10-CM | POA: Diagnosis present

## 2016-06-25 DIAGNOSIS — J9621 Acute and chronic respiratory failure with hypoxia: Secondary | ICD-10-CM | POA: Diagnosis not present

## 2016-06-25 DIAGNOSIS — D72829 Elevated white blood cell count, unspecified: Secondary | ICD-10-CM | POA: Diagnosis present

## 2016-06-25 DIAGNOSIS — Z833 Family history of diabetes mellitus: Secondary | ICD-10-CM | POA: Diagnosis not present

## 2016-06-25 DIAGNOSIS — I2721 Secondary pulmonary arterial hypertension: Secondary | ICD-10-CM | POA: Diagnosis not present

## 2016-06-25 DIAGNOSIS — I10 Essential (primary) hypertension: Secondary | ICD-10-CM | POA: Diagnosis present

## 2016-06-25 DIAGNOSIS — Z9119 Patient's noncompliance with other medical treatment and regimen: Secondary | ICD-10-CM | POA: Diagnosis not present

## 2016-06-25 DIAGNOSIS — Z87891 Personal history of nicotine dependence: Secondary | ICD-10-CM | POA: Diagnosis not present

## 2016-06-25 DIAGNOSIS — J9611 Chronic respiratory failure with hypoxia: Secondary | ICD-10-CM | POA: Diagnosis not present

## 2016-06-25 DIAGNOSIS — R079 Chest pain, unspecified: Secondary | ICD-10-CM | POA: Diagnosis not present

## 2016-06-25 DIAGNOSIS — Z818 Family history of other mental and behavioral disorders: Secondary | ICD-10-CM | POA: Diagnosis not present

## 2016-06-25 DIAGNOSIS — Z86718 Personal history of other venous thrombosis and embolism: Secondary | ICD-10-CM | POA: Diagnosis not present

## 2016-06-25 DIAGNOSIS — R52 Pain, unspecified: Secondary | ICD-10-CM | POA: Diagnosis not present

## 2016-06-25 DIAGNOSIS — D638 Anemia in other chronic diseases classified elsewhere: Secondary | ICD-10-CM | POA: Diagnosis not present

## 2016-06-25 LAB — COMPREHENSIVE METABOLIC PANEL
ALT: 21 U/L (ref 17–63)
AST: 32 U/L (ref 15–41)
Albumin: 4 g/dL (ref 3.5–5.0)
Alkaline Phosphatase: 94 U/L (ref 38–126)
Anion gap: 7 (ref 5–15)
BUN: 8 mg/dL (ref 6–20)
CHLORIDE: 109 mmol/L (ref 101–111)
CO2: 22 mmol/L (ref 22–32)
CREATININE: 0.81 mg/dL (ref 0.61–1.24)
Calcium: 8.9 mg/dL (ref 8.9–10.3)
GFR calc Af Amer: >60 mL/min (ref >60)
Glucose, Bld: 112 mg/dL — ABNORMAL HIGH (ref 65–99)
Potassium: 3.7 mmol/L (ref 3.5–5.1)
Sodium: 138 mmol/L (ref 135–145)
Total Bilirubin: 4 mg/dL — ABNORMAL HIGH (ref 0.3–1.2)
Total Protein: 7.5 g/dL (ref 6.5–8.1)

## 2016-06-25 LAB — URINALYSIS, ROUTINE W REFLEX MICROSCOPIC
BILIRUBIN URINE: NEGATIVE
GLUCOSE, UA: NEGATIVE mg/dL
HGB URINE DIPSTICK: NEGATIVE
KETONES UR: NEGATIVE mg/dL
LEUKOCYTES UA: NEGATIVE
NITRITE: NEGATIVE
PROTEIN: 30 mg/dL — AB
Specific Gravity, Urine: 1.012 (ref 1.005–1.030)
pH: 7 (ref 5.0–8.0)

## 2016-06-25 LAB — CBC WITH DIFFERENTIAL/PLATELET
BASOS ABS: 0.1 10*3/uL (ref 0.0–0.1)
Basophils Relative: 1 %
EOS PCT: 2 %
Eosinophils Absolute: 0.3 10*3/uL (ref 0.0–0.7)
HEMATOCRIT: 25.7 % — AB (ref 39.0–52.0)
Hemoglobin: 8.6 g/dL — ABNORMAL LOW (ref 13.0–17.0)
LYMPHS PCT: 10 %
Lymphs Abs: 1.3 10*3/uL (ref 0.7–4.0)
MCH: 30.4 pg (ref 26.0–34.0)
MCHC: 33.5 g/dL (ref 30.0–36.0)
MCV: 90.8 fL (ref 78.0–100.0)
Monocytes Absolute: 1.6 10*3/uL — ABNORMAL HIGH (ref 0.1–1.0)
Monocytes Relative: 11 %
Neutro Abs: 10.6 10*3/uL — ABNORMAL HIGH (ref 1.7–7.7)
Neutrophils Relative %: 76 %
Platelets: 494 10*3/uL — ABNORMAL HIGH (ref 150–400)
RBC: 2.83 MIL/uL — AB (ref 4.22–5.81)
RDW: 19.9 % — ABNORMAL HIGH (ref 11.5–15.5)
WBC: 13.8 10*3/uL — AB (ref 4.0–10.5)

## 2016-06-25 LAB — RETICULOCYTES
RBC.: 2.83 MIL/uL — ABNORMAL LOW (ref 4.22–5.81)
RETIC CT PCT: 13.1 % — AB (ref 0.4–3.1)
Retic Count, Absolute: 370.7 10*3/uL — ABNORMAL HIGH (ref 19.0–186.0)

## 2016-06-25 MED ORDER — SODIUM CHLORIDE 0.9 % IV SOLN
25.0000 mg | INTRAVENOUS | Status: DC | PRN
  Filled 2016-06-25: qty 0.5

## 2016-06-25 MED ORDER — PROCHLORPERAZINE EDISYLATE 5 MG/ML IJ SOLN
10.0000 mg | Freq: Once | INTRAMUSCULAR | Status: AC
  Administered 2016-06-25: 10 mg via INTRAVENOUS
  Filled 2016-06-25: qty 2

## 2016-06-25 MED ORDER — AMBRISENTAN 5 MG PO TABS: 5.0000 mg | ORAL_TABLET | Freq: Every day | ORAL | Status: DC

## 2016-06-25 MED ORDER — CARVEDILOL 3.125 MG PO TABS
3.1250 mg | ORAL_TABLET | Freq: Two times a day (BID) | ORAL | Status: DC
  Administered 2016-06-25 – 2016-07-02 (×14): 3.125 mg via ORAL
  Filled 2016-06-25 (×14): qty 1

## 2016-06-25 MED ORDER — LISINOPRIL 5 MG PO TABS
2.5000 mg | ORAL_TABLET | Freq: Every day | ORAL | Status: DC
Start: 2016-06-25 — End: 2016-07-02
  Administered 2016-06-25 – 2016-07-01 (×6): 2.5 mg via ORAL
  Filled 2016-06-25 (×7): qty 1

## 2016-06-25 MED ORDER — FOLIC ACID 1 MG PO TABS
2.0000 mg | ORAL_TABLET | Freq: Every day | ORAL | Status: DC
  Administered 2016-06-25 – 2016-07-02 (×8): 2 mg via ORAL
  Filled 2016-06-25 (×8): qty 2

## 2016-06-25 MED ORDER — SODIUM CHLORIDE 0.9 % IV BOLUS (SEPSIS)
1000.0000 mL | Freq: Once | INTRAVENOUS | Status: AC
  Administered 2016-06-25: 1000 mL via INTRAVENOUS

## 2016-06-25 MED ORDER — ONDANSETRON HCL 4 MG/2ML IJ SOLN: 4.0000 mg | Freq: Four times a day (QID) | INTRAMUSCULAR | Status: DC | PRN

## 2016-06-25 MED ORDER — MORPHINE SULFATE ER 30 MG PO TBCR
30.0000 mg | EXTENDED_RELEASE_TABLET | Freq: Two times a day (BID) | ORAL | Status: DC
Start: 2016-06-25 — End: 2016-07-02
  Administered 2016-06-25 – 2016-07-02 (×14): 30 mg via ORAL
  Filled 2016-06-25 (×15): qty 1

## 2016-06-25 MED ORDER — SENNOSIDES-DOCUSATE SODIUM 8.6-50 MG PO TABS
1.0000 | ORAL_TABLET | Freq: Two times a day (BID) | ORAL | Status: DC
  Administered 2016-06-25 – 2016-07-02 (×13): 1 via ORAL
  Filled 2016-06-25 (×14): qty 1

## 2016-06-25 MED ORDER — HYDROMORPHONE HCL 1 MG/ML IJ SOLN
1.0000 mg | Freq: Once | INTRAMUSCULAR | Status: AC
  Administered 2016-06-25: 1 mg via INTRAVENOUS
  Filled 2016-06-25: qty 1

## 2016-06-25 MED ORDER — ONDANSETRON HCL 4 MG/2ML IJ SOLN
4.0000 mg | Freq: Once | INTRAMUSCULAR | Status: AC
  Administered 2016-06-25: 4 mg via INTRAVENOUS
  Filled 2016-06-25: qty 2

## 2016-06-25 MED ORDER — KETOROLAC TROMETHAMINE 30 MG/ML IJ SOLN
30.0000 mg | Freq: Four times a day (QID) | INTRAMUSCULAR | Status: AC
  Administered 2016-06-25 – 2016-06-30 (×20): 30 mg via INTRAVENOUS
  Filled 2016-06-25 (×20): qty 1

## 2016-06-25 MED ORDER — ENOXAPARIN SODIUM 40 MG/0.4ML ~~LOC~~ SOLN
40.0000 mg | SUBCUTANEOUS | Status: DC
  Filled 2016-06-25: qty 0.4

## 2016-06-25 MED ORDER — DIPHENHYDRAMINE HCL 50 MG/ML IJ SOLN
25.0000 mg | Freq: Once | INTRAMUSCULAR | Status: AC
  Administered 2016-06-25: 25 mg via INTRAVENOUS
  Filled 2016-06-25: qty 1

## 2016-06-25 MED ORDER — L-GLUTAMINE ORAL POWDER
15.0000 g | PACK | Freq: Two times a day (BID) | ORAL | Status: DC
  Administered 2016-06-25 – 2016-07-02 (×10): 15 g via ORAL
  Filled 2016-06-25 (×16): qty 3

## 2016-06-25 MED ORDER — NALOXONE HCL 0.4 MG/ML IJ SOLN: 0.4000 mg | INTRAMUSCULAR | Status: DC | PRN

## 2016-06-25 MED ORDER — TORSEMIDE 20 MG PO TABS
20.0000 mg | ORAL_TABLET | Freq: Every day | ORAL | Status: DC | PRN
  Filled 2016-06-25: qty 1

## 2016-06-25 MED ORDER — SODIUM CHLORIDE 0.9% FLUSH: 9.0000 mL | INTRAVENOUS | Status: DC | PRN

## 2016-06-25 MED ORDER — HYDROMORPHONE 1 MG/ML IV SOLN
INTRAVENOUS | Status: DC
  Administered 2016-06-25: 9.6 mg via INTRAVENOUS
  Administered 2016-06-25: 9 mg via INTRAVENOUS
  Administered 2016-06-25 – 2016-06-26 (×2): 25 mg via INTRAVENOUS
  Administered 2016-06-26: 3.6 mg via INTRAVENOUS
  Administered 2016-06-26: 8.4 mg via INTRAVENOUS
  Administered 2016-06-26: 6 mg via INTRAVENOUS
  Administered 2016-06-26: 25 mg via INTRAVENOUS
  Administered 2016-06-26: 2.4 mg via INTRAVENOUS
  Administered 2016-06-27: 4.8 mg via INTRAVENOUS
  Administered 2016-06-27: 1.2 mg via INTRAVENOUS
  Administered 2016-06-27: 6 mg via INTRAVENOUS
  Administered 2016-06-27: 10.2 mg via INTRAVENOUS
  Administered 2016-06-27: 25 mg via INTRAVENOUS
  Administered 2016-06-27: 10.8 mg via INTRAVENOUS
  Administered 2016-06-27: 4.2 mg via INTRAVENOUS
  Administered 2016-06-28: 9.6 mg via INTRAVENOUS
  Administered 2016-06-28: 25 mg via INTRAVENOUS
  Administered 2016-06-28: 7.8 mg via INTRAVENOUS
  Administered 2016-06-28: 9.6 mg via INTRAVENOUS
  Administered 2016-06-28: 1.2 mg via INTRAVENOUS
  Administered 2016-06-28: 25 mg via INTRAVENOUS
  Administered 2016-06-28: 4.2 mg via INTRAVENOUS
  Administered 2016-06-29: 3 mg via INTRAVENOUS
  Administered 2016-06-29: 6.6 mg via INTRAVENOUS
  Administered 2016-06-29: 0.6 mg via INTRAVENOUS
  Administered 2016-06-29: 1.2 mg via INTRAVENOUS
  Administered 2016-06-29: 7.8 mg via INTRAVENOUS
  Administered 2016-06-29: 25 mg via INTRAVENOUS
  Administered 2016-06-30: 7.7 mg via INTRAVENOUS
  Administered 2016-06-30: 25 mg via INTRAVENOUS
  Administered 2016-06-30: 7.2 mg via INTRAVENOUS
  Filled 2016-06-25 (×8): qty 25

## 2016-06-25 MED ORDER — ZOLPIDEM TARTRATE 10 MG PO TABS
10.0000 mg | ORAL_TABLET | Freq: Every day | ORAL | Status: DC
  Administered 2016-06-25 – 2016-07-01 (×7): 10 mg via ORAL
  Filled 2016-06-25 (×7): qty 1

## 2016-06-25 MED ORDER — HYDROMORPHONE HCL 1 MG/ML IJ SOLN
2.0000 mg | Freq: Once | INTRAMUSCULAR | Status: AC
  Administered 2016-06-25: 2 mg via INTRAVENOUS
  Filled 2016-06-25: qty 2

## 2016-06-25 MED ORDER — POLYETHYLENE GLYCOL 3350 17 G PO PACK: 17.0000 g | PACK | Freq: Every day | ORAL | Status: DC | PRN

## 2016-06-25 MED ORDER — DIPHENHYDRAMINE HCL 25 MG PO CAPS
25.0000 mg | ORAL_CAPSULE | ORAL | Status: DC | PRN
  Administered 2016-06-25: 25 mg via ORAL
  Administered 2016-06-26 – 2016-07-01 (×6): 50 mg via ORAL
  Filled 2016-06-25 (×3): qty 2
  Filled 2016-06-25: qty 1
  Filled 2016-06-25 (×3): qty 2

## 2016-06-25 MED ORDER — MORPHINE SULFATE ER 30 MG PO TBCR: 30.0000 mg | EXTENDED_RELEASE_TABLET | Freq: Two times a day (BID) | ORAL | Status: DC

## 2016-06-25 MED ORDER — POTASSIUM CHLORIDE CRYS ER 20 MEQ PO TBCR
40.0000 meq | EXTENDED_RELEASE_TABLET | Freq: Every day | ORAL | Status: DC
  Administered 2016-06-25 – 2016-07-02 (×8): 40 meq via ORAL
  Filled 2016-06-25 (×8): qty 2

## 2016-06-25 MED ORDER — GABAPENTIN 300 MG PO CAPS
300.0000 mg | ORAL_CAPSULE | Freq: Three times a day (TID) | ORAL | Status: DC
Start: 2016-06-25 — End: 2016-07-02
  Administered 2016-06-25 – 2016-07-02 (×20): 300 mg via ORAL
  Filled 2016-06-25 (×20): qty 1

## 2016-06-25 MED ORDER — DEXTROSE-NACL 5-0.45 % IV SOLN
INTRAVENOUS | Status: DC
  Administered 2016-06-25 – 2016-06-27 (×3): via INTRAVENOUS
  Administered 2016-06-27: 1000 mL via INTRAVENOUS
  Administered 2016-06-27: 02:00:00 via INTRAVENOUS
  Administered 2016-06-28: 1000 mL via INTRAVENOUS
  Administered 2016-06-29 – 2016-07-01 (×2): via INTRAVENOUS

## 2016-06-25 MED ORDER — VITAMIN D3 25 MCG (1000 UNIT) PO TABS
2000.0000 [IU] | ORAL_TABLET | Freq: Every day | ORAL | Status: DC
  Administered 2016-06-25 – 2016-07-02 (×8): 2000 [IU] via ORAL
  Filled 2016-06-25 (×8): qty 2

## 2016-06-25 MED ORDER — RIVAROXABAN 20 MG PO TABS
20.0000 mg | ORAL_TABLET | Freq: Every day | ORAL | Status: DC
  Administered 2016-06-25 – 2016-07-01 (×7): 20 mg via ORAL
  Filled 2016-06-25 (×7): qty 1

## 2016-06-25 NOTE — H&P (Signed)
[name] is an 36 y.o. male.   Chief Complaint: Pain all over HPI: Patient is a 36 year old gentleman with history of sickle cell disease who was just discharged from the hospital about a week ago. Since then he's been back to sickle cell center and the emergency room twice. He is complaining of 9 out of 10 pain in his chest wall back and legs. He has taken all his home medications without relief. He has been seen in the ER and received up to 3 doses of pain medication still without relief. Pain is still at 8 out of 10. Patient is opiates tolerant. He also has pulmonary hypertension home oxygen with history of recurrent pulmonary embolism. He's been known to be noncompliant with medications in the past. At this point we'll admit the patient for pain management.  Past Medical History:  Diagnosis Date  . Acute chest syndrome ([place]) 06/18/2013  . Acute embolism and thrombosis of right internal jugular vein ([place])   . Alcohol consumption of one to four drinks per day   . Avascular necrosis (HCC)    Right Hip  . Blood transfusion   . Chronic anticoagulation   . Demand ischemia ([place]) 01/02/2014  . Former smoker   . Functional asplenia   . Hb-SS disease with crisis ([place])   . History of Clostridium difficile infection   . History of pulmonary embolus (PE)   . Hypertension   . Hypokalemia   . Leukocytosis    Chronic  . Mood disorder ([place])   . Noncompliance with medication regimen   . Oxygen deficiency   . Pulmonary hypertension ([place])   . Second hand tobacco smoke exposure   . Sickle cell anemia (HCC)   . Sickle-cell crisis with associated acute chest syndrome ([place]) 05/13/2013  . Stroke ([place])   . Thrombocytosis (HCC)    Chronic  . Uses marijuana     Past Surgical History:  Procedure Laterality Date  . CHOLECYSTECTOMY     01/2008  . Excision of left periauricular cyst     10/2009  . Excision of right ear lobe cyst with primary closur     11/2007  . Porta cath placement    . Porta  cath removal    . PORTACATH PLACEMENT  01/05/2012   Procedure: INSERTION PORT-A-CATH;  Surgeon: Odis Hollingshead, MD;  Location: [facility];  Service: General;  Laterality: N/A;  ultrasound guiced port a cath insertion with fluoroscopy  . Right hip replacement     08/2006  . UMBILICAL HERNIA REPAIR     01/2008    Family History  Problem Relation Age of Onset  . Sickle cell trait Mother   . Depression Mother   . Diabetes Mother   . Sickle cell trait Father   . Sickle cell trait Brother    Social History:  reports that he quit smoking about 5 years ago. His smoking use included Cigarettes. He has a 5.00 pack-year smoking history. He has never used smokeless tobacco. He reports that he does not drink alcohol or use drugs.  Allergies: No Known Allergies  Medications Prior to Admission  Medication Sig Dispense Refill  . carvedilol (COREG) 3.125 MG tablet Take 1 tablet (3.125 mg total) by mouth 2 (two) times daily with a meal.    . cholecalciferol (VITAMIN D) 1000 units tablet Take 2,000 Units by mouth daily.    . folic acid (FOLVITE) 1 MG tablet Take 2 tablets (2 mg total) by mouth daily.    Marland Kitchen  gabapentin (NEURONTIN) 300 MG capsule Take 1 capsule (300 mg total) by mouth 3 (three) times daily. 270 capsule 3  . L-glutamine (ENDARI) 5 g PACK Powder Packet Take 15 g by mouth 2 (two) times daily. 180 packet 1  . LETAIRIS 5 MG tablet Take 5 mg by mouth at bedtime.     Marland Kitchen lisinopril (PRINIVIL,ZESTRIL) 2.5 MG tablet Take 2.5 mg by mouth at bedtime.     Marland Kitchen morphine (MS CONTIN) 30 MG 12 hr tablet Take 1 tablet (30 mg total) by mouth every 12 (twelve) hours. 60 tablet 0  . potassium chloride SA (K-DUR,KLOR-CON) 20 MEQ tablet Take 2 tablets (40 mEq total) by mouth daily. 30 tablet 2  . rivaroxaban (XARELTO) 20 MG TABS tablet Take 1 tablet (20 mg total) by mouth at bedtime. 30 tablet 3  . torsemide (DEMADEX) 20 MG tablet Take 1 tablet (20 mg total) by mouth daily as needed (leg swelling).    . zolpidem  (AMBIEN) 10 MG tablet Take 1 tablet (10 mg total) by mouth at bedtime as needed for sleep. (Patient taking differently: Take 10 mg by mouth at bedtime. ) 30 tablet 0    Results for orders placed or performed during the hospital encounter of 06/25/16 (from the past 48 hour(s))  CBC with Differential/Platelet     Status: Abnormal   Collection Time: 06/25/16 10:30 AM  Result Value Ref Range   WBC 13.8 (H) 4.0 - 10.5 K/uL   RBC 2.83 (L) 4.22 - 5.81 MIL/uL   Hemoglobin 8.6 (L) 13.0 - 17.0 g/dL   HCT 25.7 (L) 39.0 - 52.0 %   MCV 90.8 78.0 - 100.0 fL   MCH 30.4 26.0 - 34.0 pg   MCHC 33.5 30.0 - 36.0 g/dL   RDW 19.9 (H) 11.5 - 15.5 %   Platelets 494 (H) 150 - 400 K/uL   Neutrophils Relative % 76 %   Neutro Abs 10.6 (H) 1.7 - 7.7 K/uL   Lymphocytes Relative 10 %   Lymphs Abs 1.3 0.7 - 4.0 K/uL   Monocytes Relative 11 %   Monocytes Absolute 1.6 (H) 0.1 - 1.0 K/uL   Eosinophils Relative 2 %   Eosinophils Absolute 0.3 0.0 - 0.7 K/uL   Basophils Relative 1 %   Basophils Absolute 0.1 0.0 - 0.1 K/uL  Comprehensive metabolic panel     Status: Abnormal   Collection Time: 06/25/16 10:30 AM  Result Value Ref Range   Sodium 138 135 - 145 mmol/L   Potassium 3.7 3.5 - 5.1 mmol/L   Chloride 109 101 - 111 mmol/L   CO2 22 22 - 32 mmol/L   Glucose, Bld 112 (H) 65 - 99 mg/dL   BUN 8 6 - 20 mg/dL   Creatinine, Ser 0.81 0.61 - 1.24 mg/dL   Calcium 8.9 8.9 - 10.3 mg/dL   Total Protein 7.5 6.5 - 8.1 g/dL   Albumin 4.0 3.5 - 5.0 g/dL   AST 32 15 - 41 U/L   ALT 21 17 - 63 U/L   Alkaline Phosphatase 94 38 - 126 U/L   Total Bilirubin 4.0 (H) 0.3 - 1.2 mg/dL   GFR calc non Af Amer >60 >60 mL/min   GFR calc Af Amer >60 >60 mL/min    Comment: (NOTE) The eGFR has been calculated using the CKD EPI equation. This calculation has not been validated in all clinical situations. eGFR's persistently <60 mL/min signify possible Chronic Kidney Disease.    Anion gap 7 5 - 15  Reticulocytes     Status: Abnormal    Collection Time: 06/25/16 10:30 AM  Result Value Ref Range   Retic Ct Pct 13.1 (H) 0.4 - 3.1 %   RBC. 2.83 (L) 4.22 - 5.81 MIL/uL   Retic Count, Manual 370.7 (H) 19.0 - 186.0 K/uL  Urinalysis, Routine w reflex microscopic     Status: Abnormal   Collection Time: 06/25/16 10:44 AM  Result Value Ref Range   Color, Urine YELLOW YELLOW   APPearance CLEAR CLEAR   Specific Gravity, Urine 1.012 1.005 - 1.030   pH 7.0 5.0 - 8.0   Glucose, UA NEGATIVE NEGATIVE mg/dL   Hgb urine dipstick NEGATIVE NEGATIVE   Bilirubin Urine NEGATIVE NEGATIVE   Ketones, ur NEGATIVE NEGATIVE mg/dL   Protein, ur 30 (A) NEGATIVE mg/dL   Nitrite NEGATIVE NEGATIVE   Leukocytes, UA NEGATIVE NEGATIVE   RBC / HPF 0-5 0 - 5 RBC/hpf   WBC, UA 0-5 0 - 5 WBC/hpf   Bacteria, UA RARE (A) NONE SEEN   Squamous Epithelial / LPF 0-5 (A) NONE SEEN   Mucous PRESENT    Hyaline Casts, UA PRESENT    *Note: Due to a large number of results and/or encounters for the requested time period, some results have not been displayed. A complete set of results can be found in Results Review.   No results found.  Review of Systems  Constitutional: Negative.   HENT: Negative.   Eyes: Negative.   Cardiovascular: Negative.   Gastrointestinal: Negative.   Genitourinary: Negative.   Musculoskeletal: Positive for back pain, joint pain and myalgias.  Skin: Negative.     Blood pressure 106/63, pulse 90, temperature 98.7 F (37.1 C), temperature source Oral, resp. rate 16, height 6' (1.829 m), weight 87.7 kg (193 lb 6.4 oz), SpO2 93 %. Physical Exam  Constitutional: He is oriented to person, place, and time. He appears well-developed and well-nourished.  HENT:  Head: Normocephalic and atraumatic.  Eyes: Conjunctivae are normal. Pupils are equal, round, and reactive to light.  Neck: Normal range of motion. Neck supple.  Cardiovascular: Normal rate, regular rhythm and normal heart sounds.   Respiratory: Effort normal and breath sounds  normal. No respiratory distress. He has no wheezes.  GI: Soft.  Musculoskeletal: Normal range of motion.  Neurological: He is alert and oriented to person, place, and time.  Skin: Skin is warm and dry.  Psychiatric: He has a normal mood and affect.     Assessment/Plan A 36 yo man admitted with sickle cell painful Crisis.  #1 sickle cell painful crisis: Patient would be admitted to medical bed. Will start him on IV Dilaudid PCA with Toradol and IV fluids. Pain management will continue until resolution of symptoms. I will keep you when his long-acting MS Contin. Reassess pain in the morning.  #2 pulmonary artery hypertension: Patient is currently on oxygen. We will continue his oxygenation as well as home medications.  #3 chronic anticoagulation: Continue Xarelto.  #4 medication noncompliance: Counseling provided  #5 chronic anemia: Hemoglobin appears to be at his baseline. Barbette Merino, MD 06/25/2016, 6:54 PM

## 2016-06-25 NOTE — ED Triage Notes (Signed)
He c/o pain in ribcage and back, which he recognizes as sickle cell.

## 2016-06-25 NOTE — ED Notes (Signed)
Patient comes in with bilateral back and side pain.  He was seen here yesterday here in the ED, then on Thursday was seen in the [facility].

## 2016-06-25 NOTE — ED Notes (Signed)
RN WILL COLLECT LABS FROM PORT

## 2016-06-25 NOTE — ED Provider Notes (Signed)
[facility] DEPT Provider Note   CSN: [id number] Arrival date & time: 06/25/16  0941     History   Chief Complaint Chief Complaint  Patient presents with  . Sickle Cell Pain Crisis    HPI [name] is a 36 y.o. male.  Patient complains of back pain. He states this is typical for his sickle cell. He has been seen 3 times this week as an outpatient for sickle cell pain and recently was discharged from hospital with pain   The history is provided by the patient.  Sickle Cell Pain Crisis  Location:  Back Severity:  Moderate Onset quality:  Gradual Similar to previous crisis episodes: yes   Timing:  Constant Progression:  Waxing and waning Associated symptoms: no chest pain, no congestion, no cough, no fatigue and no headaches     Past Medical History:  Diagnosis Date  . Acute chest syndrome ([place]) 06/18/2013  . Acute embolism and thrombosis of right internal jugular vein ([place])   . Alcohol consumption of one to four drinks per day   . Avascular necrosis (HCC)    Right Hip  . Blood transfusion   . Chronic anticoagulation   . Demand ischemia ([place]) 01/02/2014  . Former smoker   . Functional asplenia   . Hb-SS disease with crisis ([place])   . History of Clostridium difficile infection   . History of pulmonary embolus (PE)   . Hypertension   . Hypokalemia   . Leukocytosis    Chronic  . Mood disorder ([place])   . Noncompliance with medication regimen   . Oxygen deficiency   . Pulmonary hypertension ([place])   . Second hand tobacco smoke exposure   . Sickle cell anemia (HCC)   . Sickle-cell crisis with associated acute chest syndrome ([place]) 05/13/2013  . Stroke ([place])   . Thrombocytosis (HCC)    Chronic  . Uses marijuana     Patient Active Problem List   Diagnosis Date Noted  . Sickle cell crisis ([place]) 02/23/2016  . H/O arterial ischemic stroke 11/11/2015  . Slurred speech 11/10/2015  . Chronic anemia   . Hb-SS disease without crisis ([place]) 10/07/2014  .  Prolonged Q-T interval on ECG 09/10/2014  . Troponin level elevated 08/29/2014  . Cor pulmonale, chronic ([place]) 08/25/2014  . Sickle cell anemia ([place]) 06/25/2014  . Anemia of chronic disease 06/25/2014  . PAH (pulmonary artery hypertension) ([place]) 03/18/2014  . Chronic respiratory failure with hypoxia ([place]) 03/14/2014  . Paralytic strabismus, external ophthalmoplegia   . Chronic pain syndrome 12/12/2013  . Chronic anticoagulation 08/22/2013  . Essential hypertension 08/22/2013  . Pulmonary HTN ([place]) 06/18/2013  . Functional asplenia   . Vitamin D deficiency 02/13/2013  . Hx of pulmonary embolus 06/29/2012  . 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 Avascular necrosis [facility])     Past Surgical History:  Procedure Laterality Date  . CHOLECYSTECTOMY     01/2008  . Excision of left periauricular cyst     10/2009  . Excision of right ear lobe cyst with primary closur     11/2007  . Porta cath placement    . Porta cath removal    . PORTACATH PLACEMENT  01/05/2012   Procedure: INSERTION PORT-A-CATH;  Surgeon: Odis Hollingshead, MD;  Location: [facility];  Service: General;  Laterality: N/A;  ultrasound guiced port a cath insertion with fluoroscopy  . Right hip replacement     08/2006  . UMBILICAL HERNIA REPAIR     01/2008  Home Medications    Prior to Admission medications   Medication Sig Start Date End Date Taking? Authorizing Provider  carvedilol (COREG) 3.125 MG tablet Take 1 tablet (3.125 mg total) by mouth 2 (two) times daily with a meal. 05/17/16  Yes [name], MD  cholecalciferol (VITAMIN D) 1000 units tablet Take 2,000 Units by mouth daily.   Yes [provider]  folic acid (FOLVITE) 1 MG tablet Take 2 tablets (2 mg total) by mouth daily. 04/25/16  Yes [name], MD  gabapentin (NEURONTIN) 300 MG capsule Take 1 capsule (300 mg total) by mouth 3 (three) times daily. 06/01/15  Yes Jegede, Marlena Clipper, MD  L-glutamine (ENDARI) 5 g PACK Powder Packet  Take 15 g by mouth 2 (two) times daily. 04/25/16  Yes [name], MD  LETAIRIS 5 MG tablet Take 5 mg by mouth at bedtime.  06/15/16  Yes [provider]  lisinopril (PRINIVIL,ZESTRIL) 2.5 MG tablet Take 2.5 mg by mouth at bedtime.  05/22/16  Yes [provider]  morphine (MS CONTIN) 30 MG 12 hr tablet Take 1 tablet (30 mg total) by mouth every 12 (twelve) hours. 06/02/16 07/02/16 Yes Jegede, Marlena Clipper, MD  potassium chloride SA (K-DUR,KLOR-CON) 20 MEQ tablet Take 2 tablets (40 mEq total) by mouth daily. 04/27/16  Yes [name], MD  rivaroxaban (XARELTO) 20 MG TABS tablet Take 1 tablet (20 mg total) by mouth at bedtime. 06/23/16  Yes Dorena Dew, FNP  torsemide (DEMADEX) 20 MG tablet Take 1 tablet (20 mg total) by mouth daily as needed (leg swelling). 06/13/16  Yes [name], MD  zolpidem (AMBIEN) 10 MG tablet Take 1 tablet (10 mg total) by mouth at bedtime as needed for sleep. Patient taking differently: Take 10 mg by mouth at bedtime.  06/02/16 07/02/16 Yes Tresa Garter, MD    Family History Family History  Problem Relation Age of Onset  . Sickle cell trait Mother   . Depression Mother   . Diabetes Mother   . Sickle cell trait Father   . Sickle cell trait Brother     Social History Social History  Substance Use Topics  . Smoking status: Former Smoker    Packs/day: 0.50    Years: 10.00    Types: Cigarettes    Quit date: 05/29/2011  . Smokeless tobacco: Never Used  . Alcohol use No     Allergies   Patient has no known allergies.   Review of Systems Review of Systems  Constitutional: Negative for appetite change and fatigue.  HENT: Negative for congestion, ear discharge and sinus pressure.   Eyes: Negative for discharge.  Respiratory: Negative for cough.   Cardiovascular: Negative for chest pain.  Gastrointestinal: Negative for abdominal pain and diarrhea.  Genitourinary: Negative for frequency and hematuria.    Musculoskeletal: Positive for back pain.  Skin: Negative for rash.  Neurological: Negative for seizures and headaches.  Psychiatric/Behavioral: Negative for hallucinations.     Physical Exam Updated Vital Signs BP (!) 93/52 (BP Location: Right Arm)   Pulse 75   Temp 98.1 F (36.7 C) (Oral)   Resp 17   SpO2 96%   Physical Exam  Constitutional: He is oriented to person, place, and time. He appears well-developed.  HENT:  Head: Normocephalic.  Eyes: Conjunctivae and EOM are normal. No scleral icterus.  Neck: Neck supple. No thyromegaly present.  Cardiovascular: Normal rate and regular rhythm.  Exam reveals no gallop and no friction rub.  No murmur heard. Pulmonary/Chest: No stridor. He has no wheezes. He has no rales. He exhibits no tenderness.  Abdominal: He exhibits no distension. There is no tenderness. There is no rebound.  Musculoskeletal: Normal range of motion. He exhibits no edema.  Tender lumbar spine moderate  Lymphadenopathy:    He has no cervical adenopathy.  Neurological: He is oriented to person, place, and time. He exhibits normal muscle tone. Coordination normal.  Skin: No rash noted. No erythema.  Psychiatric: He has a normal mood and affect. His behavior is normal.     ED Treatments / Results  Labs (all labs ordered are listed, but only abnormal results are displayed) Labs Reviewed  CBC WITH DIFFERENTIAL/PLATELET - Abnormal; Notable for the following:       Result Value   WBC 13.8 (*)    RBC 2.83 (*)    Hemoglobin 8.6 (*)    HCT 25.7 (*)    RDW 19.9 (*)    Platelets 494 (*)    Neutro Abs 10.6 (*)    Monocytes Absolute 1.6 (*)    All other components within normal limits  COMPREHENSIVE METABOLIC PANEL - Abnormal; Notable for the following:    Glucose, Bld 112 (*)    Total Bilirubin 4.0 (*)    All other components within normal limits  RETICULOCYTES - Abnormal; Notable for the following:    Retic Ct Pct 13.1 (*)    RBC. 2.83 (*)    Retic  Count, Manual 370.7 (*)    All other components within normal limits  URINALYSIS, ROUTINE W REFLEX MICROSCOPIC - Abnormal; Notable for the following:    Protein, ur 30 (*)    Bacteria, UA RARE (*)    Squamous Epithelial / LPF 0-5 (*)    All other components within normal limits    EKG  EKG Interpretation None       Radiology No results found.  Procedures Procedures (including critical care time)  Medications Ordered in ED Medications  HYDROmorphone (DILAUDID) injection 2 mg (2 mg Intravenous Given 06/25/16 1106)  ondansetron (ZOFRAN) injection 4 mg (4 mg Intravenous Given 06/25/16 1106)  prochlorperazine (COMPAZINE) injection 10 mg (10 mg Intravenous Given 06/25/16 1106)  sodium chloride 0.9 % bolus 1,000 mL (0 mLs Intravenous Stopped 06/25/16 1210)  HYDROmorphone (DILAUDID) injection 2 mg (2 mg Intravenous Given 06/25/16 1206)  diphenhydrAMINE (BENADRYL) injection 25 mg (25 mg Intravenous Given 06/25/16 1254)  HYDROmorphone (DILAUDID) injection 1 mg (1 mg Intravenous Given 06/25/16 1405)     Initial Impression / Assessment and Plan / ED Course  I have reviewed the triage vital signs and the nursing notes.  Pertinent labs & imaging results that were available during my care of the patient were reviewed by me and considered in my medical decision making (see chart for details).     Patient with sickle cell crisis. He has been given Dilaudid 3 times and still in pain. Hospitalist will evaluate the patient and possibly admit him for sickle pain  Final Clinical Impressions(s) / ED Diagnoses   Final diagnoses:  Sickle cell pain crisis [facility])    New Prescriptions New Prescriptions   No medications on file     Milton Ferguson, MD 06/25/16 1436

## 2016-06-26 ENCOUNTER — Inpatient Hospital Stay (HOSPITAL_COMMUNITY): Payer: Medicare Other

## 2016-06-26 MED ORDER — CYCLOBENZAPRINE HCL 10 MG PO TABS
10.0000 mg | ORAL_TABLET | Freq: Two times a day (BID) | ORAL | Status: DC | PRN
  Administered 2016-06-26 (×2): 10 mg via ORAL
  Filled 2016-06-26 (×2): qty 1

## 2016-06-26 NOTE — Progress Notes (Signed)
Pharmacy IV to PO conversion  The patient is receiving diphenhydramine by the intravenous route.  Based on the following criteria approved by the Pharmacy and May, diphenhydramine is being converted to the equivalent oral dose form.   Not prescribed to treat or prevent a severe allergic reaction   Not prescribed as premedication prior to receiving blood product, biologic medication, antimicrobial, or chemotherapy agent   The patient has tolerated at least one dose of an oral or enteral medication   The patient has no evidence of active gastrointestinal bleeding or impaired GI absorption (gastrectomy, short bowel, patient on TNA or NPO).   The patient is not undergoing procedural sedation  If you have any questions about this conversion, please contact the Pharmacy Department (ext [phone]).  Thank you.  Reuel Boom, PharmD Pager: [phone] 06/26/2016, 11:57 AM

## 2016-06-26 NOTE — Progress Notes (Signed)
Subjective: A 36 year old gentleman admitted with sickle cell painful crisis. Patient is complaining of 9 out of 10 pain in his hip and back. He is on Dilaudid PCA with Toradol and IVF. He has used 33 mg with 60 demands and 55 deliveries since admission. He is asking for more pain medications. Complaining of his rib cage pain bilaterally. No hypoxemia-tolerating 4L of oxygen.  Objective: Vital signs in last 24 hours: Temp:  [97.7 F (36.5 C)-98.7 F (37.1 C)] 97.7 F (36.5 C) (05/27 0449) Pulse Rate:  [65-92] 85 (05/27 0449) Resp:  [16-25] 25 (05/27 0458) BP: (90-119)/(52-83) 90/63 (05/27 0449) SpO2:  [90 %-96 %] 90 % (05/27 0458) Weight:  [87.7 kg (193 lb 6.4 oz)-88.8 kg (195 lb 11.2 oz)] 88.8 kg (195 lb 11.2 oz) (05/27 0449) Weight change:  Last BM Date: 06/25/16  Intake/Output from previous day: 05/26 0701 - 05/27 0700 In: 2110 [P.O.:880; I.V.:1230] Out: 300 [Urine:300] Intake/Output this shift: No intake/output data recorded.  General appearance: alert, cooperative, appears stated age and no distress Neck: no adenopathy, no carotid bruit, no JVD, supple, symmetrical, trachea midline and thyroid not enlarged, symmetric, no tenderness/mass/nodules Back: symmetric, no curvature. ROM normal. No CVA tenderness. Resp: clear to auscultation bilaterally Chest wall: no tenderness Cardio: regular rate and rhythm, S1, S2 normal, no murmur, click, rub or gallop GI: soft, non-tender; bowel sounds normal; no masses,  no organomegaly Extremities: extremities normal, atraumatic, no cyanosis or edema Pulses: 2+ and symmetric Skin: Skin color, texture, turgor normal. No rashes or lesions Neurologic: Grossly normal  Lab Results:  Recent Labs  06/24/16 1720 06/25/16 1030  WBC 11.9* 13.8*  HGB 8.4* 8.6*  HCT 25.8* 25.7*  PLT 493* 494*   BMET  Recent Labs  06/24/16 1720 06/25/16 1030  NA 138 138  K 3.5 3.7  CL 108 109  CO2 23 22  GLUCOSE 102* 112*  BUN 6 8  CREATININE 0.68  0.81  CALCIUM 9.0 8.9    Studies/Results: Dg Chest Port 1 View  Result Date: 06/26/2016 CLINICAL DATA:  Hypoxia, sickle cell EXAM: PORTABLE CHEST 1 VIEW COMPARISON:  06/06/2016 FINDINGS: Mild lingular and right lower lobe opacities, atelectasis versus pneumonia. No pleural effusion or pneumothorax. Cardiomegaly. Left chest power port terminates in the cavoatrial junction. IMPRESSION: Mild lingular and right lower lobe opacities, atelectasis versus pneumonia Electronically Signed   By: Julian Hy M.D.   On: 06/26/2016 07:16    Medications: I have reviewed the patient's current medications.  Assessment/Plan: A 36 year old gentleman admitted with sickle cell painful crisis.  1. Sickle Cell painful crisis: patient will be maintained on current regimen. No Physician assisted dosing. He has chronic pain in addition to his SCC.  2. Sickle cell anemia: continue to monitor H/H. Stable at this point.  3. Chronic anticoagulation Continue Xarelto  4. HTN: controlled  5. Chronic hypoxemia: due to Pulm HTN as well as chronic PE. Continue oxygen.  LOS: 1 day   Codey Burling,[name] 06/26/2016, 7:30 AM

## 2016-06-27 DIAGNOSIS — Z9119 Patient's noncompliance with other medical treatment and regimen: Secondary | ICD-10-CM

## 2016-06-27 DIAGNOSIS — D638 Anemia in other chronic diseases classified elsewhere: Secondary | ICD-10-CM

## 2016-06-27 DIAGNOSIS — I2721 Secondary pulmonary arterial hypertension: Secondary | ICD-10-CM

## 2016-06-27 DIAGNOSIS — J9611 Chronic respiratory failure with hypoxia: Secondary | ICD-10-CM

## 2016-06-27 DIAGNOSIS — R52 Pain, unspecified: Secondary | ICD-10-CM

## 2016-06-27 DIAGNOSIS — G894 Chronic pain syndrome: Secondary | ICD-10-CM

## 2016-06-27 LAB — CBC WITH DIFFERENTIAL/PLATELET
BASOS ABS: 0.1 10*3/uL (ref 0.0–0.1)
Basophils Relative: 1 %
EOS ABS: 0.6 10*3/uL (ref 0.0–0.7)
Eosinophils Relative: 5 %
HEMATOCRIT: 23.1 % — AB (ref 39.0–52.0)
HEMOGLOBIN: 7.7 g/dL — AB (ref 13.0–17.0)
LYMPHS PCT: 18 %
Lymphs Abs: 2.2 10*3/uL (ref 0.7–4.0)
MCH: 30.2 pg (ref 26.0–34.0)
MCHC: 33.3 g/dL (ref 30.0–36.0)
MCV: 90.6 fL (ref 78.0–100.0)
MONOS PCT: 15 %
Monocytes Absolute: 1.9 10*3/uL — ABNORMAL HIGH (ref 0.1–1.0)
NEUTROS ABS: 7.6 10*3/uL (ref 1.7–7.7)
NEUTROS PCT: 61 %
Platelets: 496 10*3/uL — ABNORMAL HIGH (ref 150–400)
RBC: 2.55 MIL/uL — AB (ref 4.22–5.81)
RDW: 19.5 % — ABNORMAL HIGH (ref 11.5–15.5)
WBC: 12.4 10*3/uL — AB (ref 4.0–10.5)

## 2016-06-27 LAB — RETICULOCYTES
RBC.: 2.55 MIL/uL — AB (ref 4.22–5.81)
RETIC COUNT ABSOLUTE: 418.2 10*3/uL — AB (ref 19.0–186.0)
RETIC CT PCT: 16.4 % — AB (ref 0.4–3.1)

## 2016-06-27 MED ORDER — HYDROXYUREA 500 MG PO CAPS
1000.0000 mg | ORAL_CAPSULE | ORAL | Status: DC
  Administered 2016-06-29 – 2016-07-01 (×2): 1000 mg via ORAL
  Filled 2016-06-27 (×2): qty 2

## 2016-06-27 MED ORDER — HYDROMORPHONE HCL 4 MG PO TABS
4.0000 mg | ORAL_TABLET | ORAL | Status: DC
  Administered 2016-06-27 – 2016-06-30 (×18): 4 mg via ORAL
  Filled 2016-06-27 (×18): qty 1

## 2016-06-27 MED ORDER — HYDROXYUREA 500 MG PO CAPS
500.0000 mg | ORAL_CAPSULE | ORAL | Status: DC
  Administered 2016-06-28 – 2016-07-02 (×3): 500 mg via ORAL
  Filled 2016-06-27 (×3): qty 1

## 2016-06-27 NOTE — Progress Notes (Addendum)
SICKLE CELL SERVICE PROGRESS NOTE  [name]:[id number] DOB: [birth year] DOA: 06/25/2016 PCP: Tresa Garter, MD  Assessment/Plan: Active Problems:   Sickle cell crisis ([place])   Sickle cell disease with crisis ([place])  1. Hb SS with Crisis: This is a patient with Continue PCA at current dose, Toradol and decrease IVF to [facility]. Will also give clinician assisted dose of  Dilaudid 1 mg x 1 to assist in managing the acute pain. 2. Acute pain: I am unsure that the current acute pain is due to SCD as the % Hb S was 10.2% 12 x days ago. This however could have been worsened by the multiple transient hypoxic states. Will treat as crisis for now and obtain a Hb electrophoresis to evaluate Hb S during this time of acute pain.  3. Anemia of Chronic Disease: Hb at baseline  (post apheresis Hb 5/16: 7.8 g/dL). Will continue to monitor. Currently taking Hydrea 500 mg alternating with 1000 mg on a QOD basis. Restart Hydrea and continue Folic Acid. 4. Mild Leukocytosis: No evidence of infection. Baseline WBC pre-pheresis 13.3 and  post apheresis 7.4. Next RBC pheresis scheduled for 6/13.  5. Chronic respiratory failure with Hypoxia: At baseline of 3 l/min of Oxygen.  6. PAH with Cor Pulmonale: Resultant chronic hypoxic respiratory failure. Was on Letairis but this was discontinued due to poor follow up and surveillance of medication. Pt has a follow up appointment for 06/27/2016. 7. Chronic Pain Syndrome: Continue MS Contin and schedule oral Dilaudid as he takes it ATC on a daily basis.  8. Chronic Anticoagulation: Continue Xarelto  9. Secondary Hemochromatosis: Pt's most recent Ferritin levels were 2,527 ng/ml on 06/14/2016. He has discontinued Jadenu on advice of his mother.     Code Status: Full Code Family Communication: N/A Disposition Plan: Not yet ready for discharge  [place].  Pager [phone]. If 7PM-7AM, please contact night-coverage.  06/27/2016, 12:52 PM  LOS: 2 days    Interim History: Discussed with patient that given that 12 days ago the % of Hb S was 10% and Hb A 85.9%, it is unlikely that the pain he is experiencing is due to sickle Cell Crisis unless he has been without his Oxygen which would create an RBC environment leading to sickling. Pt offered that he does not use his Oxygen when he goes to the store which is about 30 minute 1-2 x day. He also states that he does not intend to wear the Oxygen when going out of the house as he does not like to wear the Oxygen. He also reports that he usually takes MS Contin and oral Dilaudid ATC on a daily basis.  Currently he rates his pain at 8/10 and localized to his ribs and low back. He has used 27 mg of Dilaudid with 46/45:demands/deliveries in the last 24 hours.   Consultants:  None  Procedures:  None  Antibiotics:  None    Objective: Vitals:   06/27/16 0620 06/27/16 0844 06/27/16 1038 06/27/16 1125  BP:   113/77   Pulse:   80   Resp:  17 16 (!) 24  Temp:   98.3 F (36.8 C)   TempSrc:   Oral   SpO2:  92% 92% 93%  Weight: 92.6 kg (204 lb 1.6 oz)     Height:       Weight change: 4.853 kg (10 lb 11.2 oz)  Intake/Output Summary (Last 24 hours) at 06/27/16 1252 Last data filed at 06/27/16 1128  Gross per  24 hour  Intake              720 ml  Output             1675 ml  Net             -955 ml     Physical Exam General: Alert, awake, oriented x3, in visible distress due to pain.  HEENT: [place]/AT PEERL, EOMI, anicteric Neck: Trachea midline,  no masses, no thyromegal,y no JVD, no carotid bruit OROPHARYNX:  Moist, No exudate/ erythema/lesions.  Heart: Regular rate and rhythm, without murmurs, rubs, gallops, PMI non-displaced, no heaves or thrills on palpation.  Lungs: Clear to auscultation, no wheezing or rhonchi noted. No increased vocal fremitus resonant to percussion  Abdomen: Soft, nontender, nondistended, positive bowel sounds, no masses no hepatosplenomegaly noted.  Neuro: No focal  neurological deficits noted cranial nerves II through XII grossly intact. Strength at functional baseline in bilateral upper and lower extremities. Musculoskeletal: No warmth swelling or erythema around joints, no spinal tenderness noted. Psychiatric: Patient alert and oriented x3, good insight and cognition, good recent to remote recall.    Data Reviewed: Basic Metabolic Panel:  Recent Labs Lab 06/22/16 1828 06/23/16 0920 06/24/16 1720 06/25/16 1030  NA 137 137 138 138  K 3.5 3.6 3.5 3.7  CL 105 106 108 109  CO2 25 24 23 22   GLUCOSE 97 109* 102* 112*  BUN 8 9 6 8   CREATININE 0.70 0.84 0.68 0.81  CALCIUM 9.1 8.7* 9.0 8.9   Liver Function Tests:  Recent Labs Lab 06/22/16 1828 06/23/16 0920 06/24/16 1720 06/25/16 1030  AST 38 38 33 32  ALT 26 25 23 21   ALKPHOS 87 108 92 94  BILITOT 4.4* 3.9* 4.3* 4.0*  PROT 7.3 7.4 7.5 7.5  ALBUMIN 4.0 3.9 4.2 4.0   No results for input(s): LIPASE, AMYLASE in the last 168 hours. No results for input(s): AMMONIA in the last 168 hours. CBC:  Recent Labs Lab 06/22/16 1828 06/23/16 0920 06/24/16 1720 06/25/16 1030 06/27/16 1156  WBC 13.5* 13.3* 11.9* 13.8* 12.4*  NEUTROABS 9.6* 9.7* 7.8* 10.6* PENDING  HGB 7.1* 8.3* 8.4* 8.6* 7.7*  HCT 20.9* 24.2* 25.8* 25.7* 23.1*  MCV 89.7 90.0 91.8 90.8 90.6  PLT 415* 459* 493* 494* 496*   Cardiac Enzymes: No results for input(s): CKTOTAL, CKMB, CKMBINDEX, TROPONINI in the last 168 hours. BNP (last 3 results)  Recent Labs  04/21/16 1139 05/05/16 1206 05/16/16 1724  BNP 321.8* 639.0* 299.8*    ProBNP (last 3 results) No results for input(s): PROBNP in the last 8760 hours.  CBG: No results for input(s): GLUCAP in the last 168 hours.  No results found for this or any previous visit (from the past 240 hour(s)).   Studies: Dg Chest 2 View  Result Date: 06/06/2016 CLINICAL DATA:  Rib pain. Shortness of breath today. History of sickle cell anemia. EXAM: CHEST  2 VIEW COMPARISON:   Radiographs 05/28/2016, most recent chest CT 05/06/2016 FINDINGS: Left chest port remains in place. Cardiomegaly is similar to prior. Question of worsening vascular congestion versus differences in AP technique. No definite pleural effusion. No focal airspace disease. No pneumothorax. No acute rib abnormalities identified radiographically. IMPRESSION: Similar cardiomegaly. Question of worsening vascular congestion versus differences in technique. No new focal airspace disease. Electronically Signed   By: Jeb Levering M.D.   On: 06/06/2016 18:32   Dg Chest Port 1 View  Result Date: 06/26/2016 CLINICAL DATA:  Hypoxia, sickle cell  EXAM: PORTABLE CHEST 1 VIEW COMPARISON:  06/06/2016 FINDINGS: Mild lingular and right lower lobe opacities, atelectasis versus pneumonia. No pleural effusion or pneumothorax. Cardiomegaly. Left chest power port terminates in the cavoatrial junction. IMPRESSION: Mild lingular and right lower lobe opacities, atelectasis versus pneumonia Electronically Signed   By: Julian Hy M.D.   On: 06/26/2016 07:16    Scheduled Meds: . ambrisentan  5 mg Oral QHS  . carvedilol  3.125 mg Oral BID WC  . cholecalciferol  2,000 Units Oral Daily  . folic acid  2 mg Oral Daily  . gabapentin  300 mg Oral TID  . HYDROmorphone   Intravenous Q4H  . ketorolac  30 mg Intravenous Q6H  . L-glutamine  15 g Oral BID  . lisinopril  2.5 mg Oral QHS  . morphine  30 mg Oral Q12H  . potassium chloride SA  40 mEq Oral Daily  . rivaroxaban  20 mg Oral QHS  . senna-docusate  1 tablet Oral BID  . zolpidem  10 mg Oral QHS   Continuous Infusions: . dextrose 5 % and 0.45% NaCl 100 mL/hr at 06/27/16 1048    Active Problems:   Sickle cell crisis (HCC)   Sickle cell disease with crisis ([place])    In excess of 35 minutes spent during this visit. Greater than 50% involved face to face contact with the patient for assessment, counseling and coordination of care.

## 2016-06-28 DIAGNOSIS — J9621 Acute and chronic respiratory failure with hypoxia: Secondary | ICD-10-CM

## 2016-06-28 LAB — BASIC METABOLIC PANEL
Anion gap: 3 — ABNORMAL LOW (ref 5–15)
BUN: 9 mg/dL (ref 6–20)
CALCIUM: 8.5 mg/dL — AB (ref 8.9–10.3)
CO2: 22 mmol/L (ref 22–32)
CREATININE: 0.97 mg/dL (ref 0.61–1.24)
Chloride: 113 mmol/L — ABNORMAL HIGH (ref 101–111)
GFR calc Af Amer: >60 mL/min (ref >60)
GFR calc non Af Amer: >60 mL/min (ref >60)
Glucose, Bld: 99 mg/dL (ref 65–99)
Potassium: 4.3 mmol/L (ref 3.5–5.1)
SODIUM: 138 mmol/L (ref 135–145)

## 2016-06-28 MED ORDER — TORSEMIDE 20 MG PO TABS
20.0000 mg | ORAL_TABLET | Freq: Once | ORAL | Status: AC
  Administered 2016-06-28: 20 mg via ORAL
  Filled 2016-06-28: qty 1

## 2016-06-28 MED ORDER — HYDROMORPHONE HCL 4 MG/ML IJ SOLN
1.0000 mg | Freq: Once | INTRAMUSCULAR | Status: AC
  Administered 2016-06-28: 1 mg via INTRAVENOUS
  Filled 2016-06-28: qty 1

## 2016-06-28 NOTE — Progress Notes (Signed)
SICKLE CELL SERVICE PROGRESS NOTE  [name]:[id number] DOB: Aug 26, 1980 DOA: 06/25/2016 PCP: Tresa Garter, MD  Assessment/Plan: Active Problems:   Sickle cell crisis ([place])   Sickle cell disease with crisis ([place])  1. Hb SS with Crisis:Will give a bolus dose of 1 mg. Continue Toradol and PCA at current dose .  2. Acute on Chronic Respiratory failure with Hypoxia: Pt is having and increased Oxygen requirement and this may be due to  Fluid overload and or transient worsening of PAH due to hypoxic state (lack of use of Oxygen for prolonged period of time). If no improvement will order 2-V CXR to evaluate for acute chest syndrome.  3. Acute pain: I am unsure that the current acute pain is due to SCD as the % Hb S was 10.2% 12 x days ago. This however could have been worsened by the multiple transient hypoxic states. Will treat as crisis for now and obtain a Hb electrophoresis to evaluate Hb S during this time of acute pain.  4. Anemia of Chronic Disease: Hb at baseline  (post apheresis Hb 5/16: 7.8 g/dL). Will continue to monitor. Currently taking Hydrea 500 mg alternating with 1000 mg on a QOD basis. Continue Hydrea and Folic Acid. Re-check labs tomorrow.  5. Mild Leukocytosis: No evidence of infection. Baseline WBC pre-pheresis 13.3 and  post apheresis 7.4. Next RBC pheresis scheduled for 6/13.  6. Chronic respiratory failure with Hypoxia: At baseline of 3 l/min of Oxygen.  7. PAH with Cor Pulmonale: Resultant chronic hypoxic respiratory failure. Was on Letairis but this was discontinued due to poor follow up and surveillance of medication. Pt had a follow up appointment for 06/27/2016 and will need to re-schedule. 8. Chronic Pain Syndrome: Continue MS Contin and scheduled oral Dilaudid as he takes it ATC on a daily basis.  9. 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 He has discontinued  Jadenu on advice of his mother.     Code Status: Full Code Family Communication: N/A Disposition Plan: Not yet ready for discharge  [place].  Pager [phone]. If 7PM-7AM, please contact night-coverage.  06/28/2016, 3:22 PM  LOS: 3 days   Interim History: Pt still having pain mostly localized to his low back at an intensity of 9/10  and less so to his right sided ribs at an intensity of 7/10. He has used 37.8 mg of Dilaudid with 64/63:demands/deliveries in the last 24 hours.   Consultants:  None  Procedures:  None  Antibiotics:  None   Objective: Vitals:   06/28/16 0752 06/28/16 1125 06/28/16 1128 06/28/16 1337  BP:  119/84 119/84   Pulse:  82 82   Resp: 18   (!) 24  Temp:      TempSrc:      SpO2: 91%     Weight:      Height:       Weight change: 2.54 kg (5 lb 9.6 oz)  Intake/Output Summary (Last 24 hours) at 06/28/16 1522 Last data filed at 06/28/16 1400  Gross per 24 hour  Intake             1680 ml  Output             2050 ml  Net             -370 ml     Physical Exam General: Alert, awake, oriented x3, in visible distress due to pain.  HEENT: [place]/AT PEERL, EOMI, anicteric Neck: Trachea midline,  no masses, no thyromegal,y no JVD, no carotid bruit OROPHARYNX:  Moist, No exudate/ erythema/lesions.  Heart: Regular rate and rhythm, without murmurs, rubs, gallops, PMI non-displaced, no heaves or thrills on palpation.  Lungs: Clear to auscultation, no wheezing or rhonchi noted. No increased vocal fremitus resonant to percussion  Abdomen: Soft, nontender, nondistended, positive bowel sounds, no masses no hepatosplenomegaly noted.  Neuro: No focal neurological deficits noted cranial nerves II through XII grossly intact. Strength at functional baseline in bilateral upper and lower extremities. Musculoskeletal: No warmth swelling or erythema around joints, no spinal tenderness noted. Psychiatric: Patient alert and oriented x3, good insight and  cognition, good recent to remote recall.    Data Reviewed: Basic Metabolic Panel:  Recent Labs Lab 06/22/16 1828 06/23/16 0920 06/24/16 1720 06/25/16 1030 06/28/16 0923  NA 137 137 138 138 138  K 3.5 3.6 3.5 3.7 4.3  CL 105 106 108 109 113*  CO2 25 24 23 22 22   GLUCOSE 97 109* 102* 112* 99  BUN 8 9 6 8 9   CREATININE 0.70 0.84 0.68 0.81 0.97  CALCIUM 9.1 8.7* 9.0 8.9 8.5*   Liver Function Tests:  Recent Labs Lab 06/22/16 1828 06/23/16 0920 06/24/16 1720 06/25/16 1030  AST 38 38 33 32  ALT 26 25 23 21   ALKPHOS 87 108 92 94  BILITOT 4.4* 3.9* 4.3* 4.0*  PROT 7.3 7.4 7.5 7.5  ALBUMIN 4.0 3.9 4.2 4.0   No results for input(s): LIPASE, AMYLASE in the last 168 hours. No results for input(s): AMMONIA in the last 168 hours. CBC:  Recent Labs Lab 06/22/16 1828 06/23/16 0920 06/24/16 1720 06/25/16 1030 06/27/16 1156  WBC 13.5* 13.3* 11.9* 13.8* 12.4*  NEUTROABS 9.6* 9.7* 7.8* 10.6* 7.6  HGB 7.1* 8.3* 8.4* 8.6* 7.7*  HCT 20.9* 24.2* 25.8* 25.7* 23.1*  MCV 89.7 90.0 91.8 90.8 90.6  PLT 415* 459* 493* 494* 496*   Cardiac Enzymes: No results for input(s): CKTOTAL, CKMB, CKMBINDEX, TROPONINI in the last 168 hours. BNP (last 3 results)  Recent Labs  04/21/16 1139 05/05/16 1206 05/16/16 1724  BNP 321.8* 639.0* 299.8*    ProBNP (last 3 results) No results for input(s): PROBNP in the last 8760 hours.  CBG: No results for input(s): GLUCAP in the last 168 hours.  No results found for this or any previous visit (from the past 240 hour(s)).   Studies: Dg Chest 2 View  Result Date: 06/06/2016 CLINICAL DATA:  Rib pain. Shortness of breath today. History of sickle cell anemia. EXAM: CHEST  2 VIEW COMPARISON:  Radiographs 05/28/2016, most recent chest CT 05/06/2016 FINDINGS: Left chest port remains in place. Cardiomegaly is similar to prior. Question of worsening vascular congestion versus differences in AP technique. No definite pleural effusion. No focal airspace  disease. No pneumothorax. No acute rib abnormalities identified radiographically. IMPRESSION: Similar cardiomegaly. Question of worsening vascular congestion versus differences in technique. No new focal airspace disease. Electronically Signed   By: Jeb Levering M.D.   On: 06/06/2016 18:32   Dg Chest Port 1 View  Result Date: 06/26/2016 CLINICAL DATA:  Hypoxia, sickle cell EXAM: PORTABLE CHEST 1 VIEW COMPARISON:  06/06/2016 FINDINGS: Mild lingular and right lower lobe opacities, atelectasis versus pneumonia. No pleural effusion or pneumothorax. Cardiomegaly. Left chest power port terminates in the cavoatrial junction. IMPRESSION: Mild lingular and right lower lobe opacities, atelectasis versus pneumonia Electronically Signed   By: Julian Hy M.D.   On: 06/26/2016 07:16  Scheduled Meds: . carvedilol  3.125 mg Oral BID WC  . cholecalciferol  2,000 Units Oral Daily  . folic acid  2 mg Oral Daily  . gabapentin  300 mg Oral TID  . HYDROmorphone   Intravenous Q4H  .  HYDROmorphone (DILAUDID) injection  1 mg Intravenous Once  . HYDROmorphone  4 mg Oral Q4H  . [START ON 06/29/2016] hydroxyurea  1,000 mg Oral QODAY  . hydroxyurea  500 mg Oral QODAY  . ketorolac  30 mg Intravenous Q6H  . L-glutamine  15 g Oral BID  . lisinopril  2.5 mg Oral QHS  . morphine  30 mg Oral Q12H  . potassium chloride SA  40 mEq Oral Daily  . rivaroxaban  20 mg Oral QHS  . senna-docusate  1 tablet Oral BID  . torsemide  20 mg Oral Once  . zolpidem  10 mg Oral QHS   Continuous Infusions: . dextrose 5 % and 0.45% NaCl 10 mL/hr (06/28/16 0925)    Active Problems:   Sickle cell crisis ([facility])   Sickle cell disease with crisis ([place])    In excess of 35 minutes spent during this visit. Greater than 50% involved face to face contact with the patient for assessment, counseling and coordination of care.

## 2016-06-29 ENCOUNTER — Inpatient Hospital Stay (HOSPITAL_COMMUNITY): Payer: Medicare Other

## 2016-06-29 DIAGNOSIS — D5701 Hb-SS disease with acute chest syndrome: Secondary | ICD-10-CM

## 2016-06-29 DIAGNOSIS — Z7901 Long term (current) use of anticoagulants: Secondary | ICD-10-CM

## 2016-06-29 LAB — BASIC METABOLIC PANEL
Anion gap: 5 (ref 5–15)
BUN: 13 mg/dL (ref 6–20)
CO2: 23 mmol/L (ref 22–32)
CREATININE: 1.1 mg/dL (ref 0.61–1.24)
Calcium: 8.5 mg/dL — ABNORMAL LOW (ref 8.9–10.3)
Chloride: 109 mmol/L (ref 101–111)
Glucose, Bld: 116 mg/dL — ABNORMAL HIGH (ref 65–99)
POTASSIUM: 4.4 mmol/L (ref 3.5–5.1)
SODIUM: 137 mmol/L (ref 135–145)

## 2016-06-29 LAB — CBC WITH DIFFERENTIAL/PLATELET
BASOS PCT: 1 %
Basophils Absolute: 0.1 10*3/uL (ref 0.0–0.1)
EOS ABS: 0.7 10*3/uL (ref 0.0–0.7)
Eosinophils Relative: 5 %
HCT: 22.2 % — ABNORMAL LOW (ref 39.0–52.0)
Hemoglobin: 7.6 g/dL — ABNORMAL LOW (ref 13.0–17.0)
Lymphocytes Relative: 16 %
Lymphs Abs: 2.1 10*3/uL (ref 0.7–4.0)
MCH: 30.8 pg (ref 26.0–34.0)
MCHC: 34.2 g/dL (ref 30.0–36.0)
MCV: 89.9 fL (ref 78.0–100.0)
MONO ABS: 2.2 10*3/uL — AB (ref 0.1–1.0)
MONOS PCT: 17 %
Neutro Abs: 7.8 10*3/uL — ABNORMAL HIGH (ref 1.7–7.7)
Neutrophils Relative %: 61 %
Platelets: 481 10*3/uL — ABNORMAL HIGH (ref 150–400)
RBC: 2.47 MIL/uL — ABNORMAL LOW (ref 4.22–5.81)
RDW: 19 % — AB (ref 11.5–15.5)
WBC: 12.9 10*3/uL — ABNORMAL HIGH (ref 4.0–10.5)

## 2016-06-29 LAB — HEMOGLOBINOPATHY EVALUATION
HGB A: 63.5 % — AB (ref 96.4–98.8)
HGB F QUANT: 2.5 % — AB (ref 0.0–2.0)
HGB S QUANTITAION: 30.8 % — AB
HGB VARIANT: 0 %
Hgb A2 Quant: 3.2 % (ref 1.8–3.2)
Hgb C: 0 %

## 2016-06-29 LAB — BLOOD GAS, ARTERIAL
Acid-base deficit: 3.8 mmol/L — ABNORMAL HIGH (ref 0.0–2.0)
BICARBONATE: 21.4 mmol/L (ref 20.0–28.0)
Drawn by: 33147
O2 CONTENT: 3 L/min
O2 SAT: 84.9 %
PATIENT TEMPERATURE: 98.6
PO2 ART: 62.4 mmHg — AB (ref 83.0–108.0)
pCO2 arterial: 42.4 mmHg (ref 32.0–48.0)
pH, Arterial: 7.323 — ABNORMAL LOW (ref 7.350–7.450)

## 2016-06-29 LAB — PREPARE RBC (CROSSMATCH)

## 2016-06-29 LAB — RETICULOCYTES
RBC.: 2.47 MIL/uL — AB (ref 4.22–5.81)
RETIC COUNT ABSOLUTE: 452 10*3/uL — AB (ref 19.0–186.0)
RETIC CT PCT: 18.3 % — AB (ref 0.4–3.1)

## 2016-06-29 MED ORDER — SODIUM CHLORIDE 0.9 % IV SOLN
Freq: Once | INTRAVENOUS | Status: AC
  Administered 2016-06-29: 20:00:00 via INTRAVENOUS

## 2016-06-29 NOTE — Progress Notes (Signed)
SICKLE CELL SERVICE PROGRESS NOTE  [name] [id number]:[id number] DOB: 1980-07-09 DOA: 06/25/2016 PCP: Tresa Garter, MD  Assessment/Plan: Active Problems:   Sickle cell crisis ([place])   Sickle cell disease with crisis ([place])   1. Acute Chest Syndrome: CXR shows and clinical course consistent with acute chest syndrome. Will continue Oxygen support and transfuse 12 unit RBC's. Check ABG on 3 L/m.   2. Acute on Chronic respiratory Failure with Hypoxia: Pt has PAH with Cor Pulmonale and now superimposed Acute Chest Syndrome. Will continue supplemental Oxygen to maintain saturations above 90% and transfuse 1 unit RBC's.  3. Hb SS with Crisis: Pain continues to be elevated. Continue Toradol and PCA at current dose.  4. Anemia of Chronic Disease: Hb stable at baseline. Continue Hydrea. Electrophoresis pending.  5. Elevated Cr: Likely secondary to Torsemide. Will continue to hold Torsemide. Re-check tomorrow. 6. Chronic Pain Syndrome: Continue MS Contin and scheduled Oral Dilaudid as patient takes this ATC.  7. Chronic Anticoagulation: Continue Xarelto     Code Status: Full Code Family Communication: N/A Disposition Plan: Not yet ready for discharge  [place].  Pager [phone]. If 7PM-7AM, please contact night-coverage.  06/29/2016, 2:38 PM  LOS: 4 days   Interim History: Pt reports pain in low back at intensity of 8/10 and in right ribs at 6/10. He has used 24.6 mg od Dilaudid with  44/41: demnds/deliveries in the last 24 hours.   Consultants:  None  Procedures:  None  Antibiotics:  None   Objective: Vitals:   06/29/16 0504 06/29/16 0744 06/29/16 1050 06/29/16 1401  BP: 106/74 102/70 126/70   Pulse: 84 80 77   Resp: 20 18 18 19   Temp: 98.2 F (36.8 C)  98 F (36.7 C)   TempSrc: Oral  Oral   SpO2: 90% 91% 95% 90%  Weight:      Height:       Weight change: -2.132 kg (-4 lb 11.2 oz)  Intake/Output Summary (Last 24 hours) at 06/29/16 1438 Last data  filed at 06/29/16 1050  Gross per 24 hour  Intake             1200 ml  Output             3800 ml  Net            -2600 ml     Physical Exam  Constitutional:     General: Alert, awake, oriented x3, in mild distress, but appears unwell. HEENT: [place]/AT PEERL, EOMI, anicteric OROPHARYNX:  Moist, No exudate/ erythema/lesions.  Heart: Regular rate and rhythm, without murmurs, rubs, gallops, PMI non-displaced, no heaves or thrills on palpation.  Lungs: Clear to auscultation, no wheezing or rhonchi noted. No increased vocal fremitus resonant to percussion  Abdomen: Soft, nontender, nondistended, positive bowel sounds, no masses no hepatosplenomegaly noted.  Neuro: No focal neurological deficits noted cranial nerves II through XII grossly intact.  Strength at baseline in bilateral upper and lower extremities. Musculoskeletal: No warmth swelling or erythema around joints, no spinal tenderness noted. Psychiatric: Patient alert and oriented x3, good insight and cognition, good recent to remote recall.    Data Reviewed: Basic Metabolic Panel:  Recent Labs Lab 06/23/16 0920 06/24/16 1720 06/25/16 1030 06/28/16 0923 06/29/16 0433  NA 137 138 138 138 137  K 3.6 3.5 3.7 4.3 4.4  CL 106 108 109 113* 109  CO2 24 23 22 22 23   GLUCOSE 109* 102* 112* 99 116*  BUN 9 6 8  9  13  CREATININE 0.84 0.68 0.81 0.97 1.10  CALCIUM 8.7* 9.0 8.9 8.5* 8.5*   Liver Function Tests:  Recent Labs Lab 06/22/16 1828 06/23/16 0920 06/24/16 1720 06/25/16 1030  AST 38 38 33 32  ALT 26 25 23 21   ALKPHOS 87 108 92 94  BILITOT 4.4* 3.9* 4.3* 4.0*  PROT 7.3 7.4 7.5 7.5  ALBUMIN 4.0 3.9 4.2 4.0   No results for input(s): LIPASE, AMYLASE in the last 168 hours. No results for input(s): AMMONIA in the last 168 hours. CBC:  Recent Labs Lab 06/23/16 0920 06/24/16 1720 06/25/16 1030 06/27/16 1156 06/29/16 0433  WBC 13.3* 11.9* 13.8* 12.4* 12.9*  NEUTROABS 9.7* 7.8* 10.6* 7.6 7.8*  HGB 8.3* 8.4* 8.6*  7.7* 7.6*  HCT 24.2* 25.8* 25.7* 23.1* 22.2*  MCV 90.0 91.8 90.8 90.6 89.9  PLT 459* 493* 494* 496* 481*   Cardiac Enzymes: No results for input(s): CKTOTAL, CKMB, CKMBINDEX, TROPONINI in the last 168 hours. BNP (last 3 results)  Recent Labs  04/21/16 1139 05/05/16 1206 05/16/16 1724  BNP 321.8* 639.0* 299.8*    ProBNP (last 3 results) No results for input(s): PROBNP in the last 8760 hours.  CBG: No results for input(s): GLUCAP in the last 168 hours.  No results found for this or any previous visit (from the past 240 hour(s)).   Studies: Dg Chest 2 View  Result Date: 06/29/2016 CLINICAL DATA:  Sickle cell crisis with chest pain and shortness of breath. EXAM: CHEST  2 VIEW COMPARISON:  06/26/2016; 04/29/2016; chest CT - 05/06/2016 FINDINGS: Grossly unchanged enlarged cardiac silhouette and mediastinal contours. Stable position of support apparatus. Suspected slight worsening of linear heterogeneous potential airspace opacities within the right mid lung. Peripheral and basilar heterogeneous opacities are unchanged. No pleural effusion or pneumothorax. No evidence of edema. No acute osseus abnormalities. IMPRESSION: Worsening ill-defined right mid lung heterogeneous opacities could be indicative of early acute chest syndrome given provided history of sickle cell disease. Electronically Signed   By: Sandi Mariscal M.D.   On: 06/29/2016 12:37   Dg Chest 2 View  Result Date: 06/06/2016 CLINICAL DATA:  Rib pain. Shortness of breath today. History of sickle cell anemia. EXAM: CHEST  2 VIEW COMPARISON:  Radiographs 05/28/2016, most recent chest CT 05/06/2016 FINDINGS: Left chest port remains in place. Cardiomegaly is similar to prior. Question of worsening vascular congestion versus differences in AP technique. No definite pleural effusion. No focal airspace disease. No pneumothorax. No acute rib abnormalities identified radiographically. IMPRESSION: Similar cardiomegaly. Question of worsening  vascular congestion versus differences in technique. No new focal airspace disease. Electronically Signed   By: Jeb Levering M.D.   On: 06/06/2016 18:32   Dg Chest Port 1 View  Result Date: 06/26/2016 CLINICAL DATA:  Hypoxia, sickle cell EXAM: PORTABLE CHEST 1 VIEW COMPARISON:  06/06/2016 FINDINGS: Mild lingular and right lower lobe opacities, atelectasis versus pneumonia. No pleural effusion or pneumothorax. Cardiomegaly. Left chest power port terminates in the cavoatrial junction. IMPRESSION: Mild lingular and right lower lobe opacities, atelectasis versus pneumonia Electronically Signed   By: Julian Hy M.D.   On: 06/26/2016 07:16    Scheduled Meds: . carvedilol  3.125 mg Oral BID WC  . cholecalciferol  2,000 Units Oral Daily  . folic acid  2 mg Oral Daily  . gabapentin  300 mg Oral TID  . HYDROmorphone   Intravenous Q4H  . HYDROmorphone  4 mg Oral Q4H  . hydroxyurea  1,000 mg Oral QODAY  . hydroxyurea  500 mg Oral QODAY  . ketorolac  30 mg Intravenous Q6H  . L-glutamine  15 g Oral BID  . lisinopril  2.5 mg Oral QHS  . morphine  30 mg Oral Q12H  . potassium chloride SA  40 mEq Oral Daily  . rivaroxaban  20 mg Oral QHS  . senna-docusate  1 tablet Oral BID  . zolpidem  10 mg Oral QHS   Continuous Infusions: . sodium chloride    . dextrose 5 % and 0.45% NaCl 10 mL/hr (06/28/16 0925)    Active Problems:   Sickle cell crisis ([facility])   Sickle cell disease with crisis ([place])    In excess of 35 minutes spent during this visit. Greater than 50% involved face to face contact with the patient for assessment, counseling and coordination of care.

## 2016-06-30 LAB — CBC WITH DIFFERENTIAL/PLATELET
BASOS PCT: 1 %
Basophils Absolute: 0.1 10*3/uL (ref 0.0–0.1)
Eosinophils Absolute: 0.7 10*3/uL (ref 0.0–0.7)
Eosinophils Relative: 6 %
HCT: 23.2 % — ABNORMAL LOW (ref 39.0–52.0)
HEMOGLOBIN: 8.1 g/dL — AB (ref 13.0–17.0)
LYMPHS ABS: 2 10*3/uL (ref 0.7–4.0)
LYMPHS PCT: 16 %
MCH: 30.7 pg (ref 26.0–34.0)
MCHC: 34.9 g/dL (ref 30.0–36.0)
MCV: 87.9 fL (ref 78.0–100.0)
Monocytes Absolute: 1.8 10*3/uL — ABNORMAL HIGH (ref 0.1–1.0)
Monocytes Relative: 15 %
NEUTROS ABS: 7.7 10*3/uL (ref 1.7–7.7)
Neutrophils Relative %: 62 %
Platelets: 498 10*3/uL — ABNORMAL HIGH (ref 150–400)
RBC: 2.64 MIL/uL — ABNORMAL LOW (ref 4.22–5.81)
RDW: 18.1 % — ABNORMAL HIGH (ref 11.5–15.5)
WBC: 12.3 10*3/uL — ABNORMAL HIGH (ref 4.0–10.5)

## 2016-06-30 LAB — BASIC METABOLIC PANEL
ANION GAP: 5 (ref 5–15)
BUN: 18 mg/dL (ref 6–20)
CHLORIDE: 111 mmol/L (ref 101–111)
CO2: 23 mmol/L (ref 22–32)
CREATININE: 1.01 mg/dL (ref 0.61–1.24)
Calcium: 8.6 mg/dL — ABNORMAL LOW (ref 8.9–10.3)
GFR calc non Af Amer: >60 mL/min (ref >60)
Glucose, Bld: 122 mg/dL — ABNORMAL HIGH (ref 65–99)
Potassium: 4.7 mmol/L (ref 3.5–5.1)
Sodium: 139 mmol/L (ref 135–145)

## 2016-06-30 LAB — RETICULOCYTES
RBC.: 2.64 MIL/uL — ABNORMAL LOW (ref 4.22–5.81)
RETIC CT PCT: 12.9 % — AB (ref 0.4–3.1)
Retic Count, Absolute: 340.6 10*3/uL — ABNORMAL HIGH (ref 19.0–186.0)

## 2016-06-30 MED ORDER — HYDROMORPHONE 1 MG/ML IV SOLN: INTRAVENOUS | Status: DC

## 2016-06-30 MED ORDER — HYDROMORPHONE 1 MG/ML IV SOLN
INTRAVENOUS | Status: DC
  Administered 2016-06-30: 1.2 mg via INTRAVENOUS
  Administered 2016-06-30: 4.8 mg via INTRAVENOUS
  Administered 2016-07-01: 3.6 mg via INTRAVENOUS
  Administered 2016-07-01: 8.4 mg via INTRAVENOUS
  Administered 2016-07-01: 1.2 mg via INTRAVENOUS
  Administered 2016-07-01: 4.8 mg via INTRAVENOUS
  Administered 2016-07-01: 7.19 mg via INTRAVENOUS
  Administered 2016-07-01: 25 mg via INTRAVENOUS
  Filled 2016-06-30: qty 25

## 2016-06-30 MED ORDER — HYDROMORPHONE HCL 4 MG PO TABS
4.0000 mg | ORAL_TABLET | ORAL | Status: DC | PRN
  Administered 2016-06-30: 4 mg via ORAL
  Filled 2016-06-30: qty 1

## 2016-06-30 NOTE — Care Management Important Message (Signed)
Important Message  Patient Details  Name: WINFREY CHILLEMI MRN: [id number] Date of Birth: [birth year]   Medicare Important Message Given:  Yes    Kerin Salen 06/30/2016, 9:44 AMImportant Message  Patient Details  Name: RICCI DIROCCO MRN: [id number] Date of Birth: 02-26-1980   Medicare Important Message Given:  Yes    Kerin Salen 06/30/2016, 9:44 AM

## 2016-06-30 NOTE — Progress Notes (Signed)
SICKLE CELL SERVICE PROGRESS NOTE  [name] RAQ:[id number] DOB: 11-08-1980 DOA: 06/25/2016 PCP: Tresa Garter, MD  Assessment/Plan: Active Problems:   Sickle cell crisis ([place])   Sickle cell disease with crisis ([place])  1. Hb SS with crisis: Pt having pain but Hb S% is at a level seen in Sickle Cell Trait. I feel that he has a large component of chronic pain which is contributing to his current pain. Will start to wean PCA and change oral Dilaudid to q 4 hours PRN. 2. Acute on Chronic Respiratory Failure with Hypoxia: CXR reflects possible acute chest syndrome is S/P transfusion of RBC's. I also feel that hypoxia is partially related to opiate effect. Wean opiates. Increase pulmonary toilerty 3. Acute Chest Syndrome: Pt is s/p transfusion and % of Hb S is even less than was noted on 5/28. If hypoxia persists will obtain CT chest and consult Pulmonary.  4. Anemia of Chronic Disease: Hb 8.1g/dL after transfusion.  5. Chronic Anticoagulation: Continue Xarelto 6. Chronic Pain Syndrome: Continue MS Contin and Dilaudid PRN.   Code Status: Full Code Family Communication: N/A Disposition Plan: Not yet ready for discharge  [place].  Pager [phone]. If 7PM-7AM, please contact night-coverage.  06/30/2016, 2:52 PM  LOS: 5 days   Interim History: Pt reports that he's having increased pain in the B/L ribs and sternum. He has used a total of 37.33 mg of Dilaudid on the PCA with 71/63:demands/deliveries in the last 24 hours. Results of electrophoresis noted.    Consultants:  None  Procedures:  None  Antibiotics:  None     Objective: Vitals:   06/30/16 0630 06/30/16 0746 06/30/16 0956 06/30/16 1222  BP: 111/84  120/77   Pulse: 90  82   Resp: 18 (!) 26 18 17   Temp: 98 F (36.7 C)  98.1 F (36.7 C)   TempSrc: Oral  Oral   SpO2:  (!) 89% 93% 93%  Weight:      Height:       Weight change: 0.68 kg (1 lb 8 oz)  Intake/Output Summary (Last 24 hours) at  06/30/16 1452 Last data filed at 06/30/16 0957  Gross per 24 hour  Intake           2752.5 ml  Output             1501 ml  Net           1251.5 ml     Physical Exam  Constitutional:    Areas of pain.    Vital Signs: Saturation 95% on 5L/min at rest, RR 14, HR 80. General: Alert, awake, oriented x3, in no acute distress. No increase work of breathing. HEENT: [place]/AT PEERL, EOMI, anicteric Neck: Trachea midline,  no masses, no thyromegal,y no JVD, no carotid bruit OROPHARYNX:  Moist, No exudate/ erythema/lesions.  Heart: Regular rate and rhythm, without murmurs, rubs, gallops, PMI non-displaced, no heaves or thrills on palpation.  Lungs: Clear to auscultation, no wheezing or rhonchi noted. No increased vocal fremitus resonant to percussion  Abdomen: Soft, nontender, nondistended, positive bowel sounds, no masses no hepatosplenomegaly noted..  Neuro: No focal neurological deficits noted cranial nerves II through XII grossly intact. Strength at functional baselin in bilateral upper and lower extremities. Musculoskeletal: No warmth swelling or erythema around joints, no spinal tenderness noted. Psychiatric: Patient alert and oriented x3, good insight and cognition, good recent to remote recall.    Data Reviewed: Basic Metabolic Panel:  Recent Labs Lab 06/24/16 1720 06/25/16  1030 06/28/16 0923 06/29/16 0433 06/30/16 0930  NA 138 138 138 137 139  K 3.5 3.7 4.3 4.4 4.7  CL 108 109 113* 109 111  CO2 23 22 22 23 23   GLUCOSE 102* 112* 99 116* 122*  BUN 6 8 9 13 18   CREATININE 0.68 0.81 0.97 1.10 1.01  CALCIUM 9.0 8.9 8.5* 8.5* 8.6*   Liver Function Tests:  Recent Labs Lab 06/24/16 1720 06/25/16 1030  AST 33 32  ALT 23 21  ALKPHOS 92 94  BILITOT 4.3* 4.0*  PROT 7.5 7.5  ALBUMIN 4.2 4.0   No results for input(s): LIPASE, AMYLASE in the last 168 hours. No results for input(s): AMMONIA in the last 168 hours. CBC:  Recent Labs Lab 06/24/16 1720 06/25/16 1030  06/27/16 1156 06/29/16 0433 06/30/16 0930  WBC 11.9* 13.8* 12.4* 12.9* 12.3*  NEUTROABS 7.8* 10.6* 7.6 7.8* 7.7  HGB 8.4* 8.6* 7.7* 7.6* 8.1*  HCT 25.8* 25.7* 23.1* 22.2* 23.2*  MCV 91.8 90.8 90.6 89.9 87.9  PLT 493* 494* 496* 481* 498*   Cardiac Enzymes: No results for input(s): CKTOTAL, CKMB, CKMBINDEX, TROPONINI in the last 168 hours. BNP (last 3 results)  Recent Labs  04/21/16 1139 05/05/16 1206 05/16/16 1724  BNP 321.8* 639.0* 299.8*    ProBNP (last 3 results) No results for input(s): PROBNP in the last 8760 hours.  CBG: No results for input(s): GLUCAP in the last 168 hours.  No results found for this or any previous visit (from the past 240 hour(s)).   Studies: Dg Chest 2 View  Result Date: 06/29/2016 CLINICAL DATA:  Sickle cell crisis with chest pain and shortness of breath. EXAM: CHEST  2 VIEW COMPARISON:  06/26/2016; 04/29/2016; chest CT - 05/06/2016 FINDINGS: Grossly unchanged enlarged cardiac silhouette and mediastinal contours. Stable position of support apparatus. Suspected slight worsening of linear heterogeneous potential airspace opacities within the right mid lung. Peripheral and basilar heterogeneous opacities are unchanged. No pleural effusion or pneumothorax. No evidence of edema. No acute osseus abnormalities. IMPRESSION: Worsening ill-defined right mid lung heterogeneous opacities could be indicative of early acute chest syndrome given provided history of sickle cell disease. Electronically Signed   By: Sandi Mariscal M.D.   On: 06/29/2016 12:37   Dg Chest 2 View  Result Date: 06/06/2016 CLINICAL DATA:  Rib pain. Shortness of breath today. History of sickle cell anemia. EXAM: CHEST  2 VIEW COMPARISON:  Radiographs 05/28/2016, most recent chest CT 05/06/2016 FINDINGS: Left chest port remains in place. Cardiomegaly is similar to prior. Question of worsening vascular congestion versus differences in AP technique. No definite pleural effusion. No focal airspace  disease. No pneumothorax. No acute rib abnormalities identified radiographically. IMPRESSION: Similar cardiomegaly. Question of worsening vascular congestion versus differences in technique. No new focal airspace disease. Electronically Signed   By: Jeb Levering M.D.   On: 06/06/2016 18:32   Dg Chest Port 1 View  Result Date: 06/26/2016 CLINICAL DATA:  Hypoxia, sickle cell EXAM: PORTABLE CHEST 1 VIEW COMPARISON:  06/06/2016 FINDINGS: Mild lingular and right lower lobe opacities, atelectasis versus pneumonia. No pleural effusion or pneumothorax. Cardiomegaly. Left chest power port terminates in the cavoatrial junction. IMPRESSION: Mild lingular and right lower lobe opacities, atelectasis versus pneumonia Electronically Signed   By: Julian Hy M.D.   On: 06/26/2016 07:16    Scheduled Meds: . carvedilol  3.125 mg Oral BID WC  . cholecalciferol  2,000 Units Oral Daily  . folic acid  2 mg Oral Daily  . gabapentin  300 mg Oral TID  . HYDROmorphone   Intravenous Q4H  . HYDROmorphone  4 mg Oral Q4H  . hydroxyurea  1,000 mg Oral QODAY  . hydroxyurea  500 mg Oral QODAY  . L-glutamine  15 g Oral BID  . lisinopril  2.5 mg Oral QHS  . morphine  30 mg Oral Q12H  . potassium chloride SA  40 mEq Oral Daily  . rivaroxaban  20 mg Oral QHS  . senna-docusate  1 tablet Oral BID  . zolpidem  10 mg Oral QHS   Continuous Infusions: . dextrose 5 % and 0.45% NaCl 10 mL/hr at 06/29/16 2008    Active Problems:   Sickle cell crisis ([facility])   Sickle cell disease with crisis ([place])       In excess of 35 minutes spent during this visit. Greater than 50% involved face to face contact with the patient for assessment, counseling and coordination of care.

## 2016-06-30 NOTE — Progress Notes (Signed)
Patient currently being transfused one unit of PRBC's. Patient order,consent, blood and identification verified by 2 RN's Wyatt Portela, RN). Patient has been educated on how/when to call for assistance for any changes that may occur during transfusion. Call bell within reach. VSS. Will continue to monitor.

## 2016-06-30 NOTE — Care Management Note (Signed)
Case Management Note  Patient Details  Name: [name] MRN: [id number] Date of Birth: Sep 01, 1980  Subjective/Objective:      36 yo admitted with Sickle Cell Crisis.              Action/Plan: Pt from home with family and has home 02 provided by [facility].  Expected Discharge Date:                  Expected Discharge Plan:  Home/Self Care  In-House Referral:     Discharge planning Services  CM Consult  Post Acute Care Choice:    Choice offered to:     DME Arranged:    DME Agency:     HH Arranged:    HH Agency:     Status of Service:  In process, will continue to follow  If discussed at Long Length of Stay Meetings, dates discussed:    Additional CommentsLynnell Catalan, RN 06/30/2016, 11:23 AM  [phone]

## 2016-07-01 LAB — BASIC METABOLIC PANEL
Anion gap: 5 (ref 5–15)
BUN: 15 mg/dL (ref 6–20)
CO2: 25 mmol/L (ref 22–32)
Calcium: 8.7 mg/dL — ABNORMAL LOW (ref 8.9–10.3)
Chloride: 110 mmol/L (ref 101–111)
Creatinine, Ser: 0.75 mg/dL (ref 0.61–1.24)
GFR calc Af Amer: >60 mL/min (ref >60)
GLUCOSE: 132 mg/dL — AB (ref 65–99)
POTASSIUM: 4.4 mmol/L (ref 3.5–5.1)
Sodium: 140 mmol/L (ref 135–145)

## 2016-07-01 LAB — BPAM RBC
Blood Product Expiration Date: 201806182359
ISSUE DATE / TIME: 201805310331
UNIT TYPE AND RH: 5100

## 2016-07-01 LAB — BRAIN NATRIURETIC PEPTIDE: B Natriuretic Peptide: 539.3 pg/mL — ABNORMAL HIGH (ref 0.0–100.0)

## 2016-07-01 LAB — TYPE AND SCREEN
ABO/RH(D): O POS
Antibody Screen: NEGATIVE
Unit division: 0

## 2016-07-01 MED ORDER — FUROSEMIDE 10 MG/ML IJ SOLN
20.0000 mg | Freq: Once | INTRAMUSCULAR | Status: AC
  Administered 2016-07-01: 20 mg via INTRAVENOUS
  Filled 2016-07-01: qty 2

## 2016-07-01 MED ORDER — HYDROMORPHONE 1 MG/ML IV SOLN
INTRAVENOUS | Status: DC
  Administered 2016-07-02: 0.8 mg via INTRAVENOUS
  Administered 2016-07-02: 5.2 mg via INTRAVENOUS
  Administered 2016-07-02: 25 mg via INTRAVENOUS
  Filled 2016-07-01: qty 25

## 2016-07-01 NOTE — Progress Notes (Signed)
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 Hb SS with crisis: Pt having pain but Hb S% is at 30 % a level seen in Sickle Cell Trait. Thus Sickle Cell cannot be contributing to his pain. I feel that he has a large component of chronic pain which is contributing to his current pain and this is an exacerbation which is likely precipitated by transient hypoxic states. Will start to wean PCA and change oral Dilaudid to q 4 hours PRN. 2. Acute on Chronic Respiratory Failure with Hypoxia: CXR reflects possible acute chest syndrome and patient is S/P transfusion of RBC's. The goal is to keep Hb closer to 9-10 g/dl.  I also feel that hypoxia is partially related to opiate effect. Wean opiates. Increase pulmonary toilerty 3. Acute Chest Syndrome: Pt is s/p transfusion and % of Hb S is even less than was noted on 5/28. If hypoxia persists will obtain CT chest and consult Pulmonary.  4. Anemia of Chronic Disease: Hb 8.1g/dL after transfusion.  5. Chronic Anticoagulation: Continue Xarelto 6. Chronic Pain Syndrome: Continue MS Contin and Dilaudid PRN.   Code Status: Full Code Family Communication: N/A Disposition Plan: Anticipate discharge tomorrow.  [name] A.  Pager [phone]. If 7PM-7AM, please contact night-coverage.  07/01/2016, 4:40 PM  LOS: 6 days   Interim History: Pt reports that he's having increased pain in the B/L ribs and sternum. He has used a total of 31.9 mg of Dilaudid on the PCA with 55/51:demands/deliveries in the last 24 hours.    Consultants:  None  Procedures:  None  Antibiotics:  None     Objective: Vitals:   07/01/16 0952 07/01/16 1207 07/01/16 1540 07/01/16 1550  BP: 107/68  118/74   Pulse: 77  83   Resp: 15 17 17  (!) 21  Temp: 98.2 F (36.8 C)  98 F (36.7  C)   TempSrc: Oral  Oral   SpO2: 96% 94% 94% 96%  Weight:      Height:       Weight change: -0.907 kg (-2 lb)  Intake/Output Summary (Last 24 hours) at 07/01/16 1640 Last data filed at 07/01/16 1541  Gross per 24 hour  Intake             1600 ml  Output             4950 ml  Net            -3350 ml     Physical Exam  Constitutional:    Areas of pain.    Vital Signs: Saturation 93% on 3L/min at rest, RR 14, HR 80. General: Alert, awake, oriented x3, in mild distress secondary to pain in the ribs and sternum. No increase work of breathing. OROPHARYNX:  Moist, No exudate/ erythema/lesions.  Heart: Regular rate and rhythm, without murmurs, rubs, gallops, PMI non-displaced, no heaves or thrills on palpation.  Lungs: Clear to auscultation, no wheezing or rhonchi noted. No increased vocal fremitus resonant to percussion  Abdomen: Soft, nontender, nondistended, positive bowel sounds, no masses no hepatosplenomegaly noted.  Neuro: No focal neurological deficits noted cranial nerves II through XII grossly intact. Strength at functional baselin in bilateral upper and lower extremities. Musculoskeletal: No warmth swelling or erythema around joints, no spinal tenderness noted. Psychiatric: Patient alert and oriented x3, good insight and cognition,  good recent to remote recall.    Data Reviewed: Basic Metabolic Panel:  Recent Labs Lab 06/25/16 1030 06/28/16 0923 06/29/16 0433 06/30/16 0930 07/01/16 0535  NA 138 138 137 139 140  K 3.7 4.3 4.4 4.7 4.4  CL 109 113* 109 111 110  CO2 22 22 23 23 25   GLUCOSE 112* 99 116* 122* 132*  BUN 8 9 13 18 15   CREATININE 0.81 0.97 1.10 1.01 0.75  CALCIUM 8.9 8.5* 8.5* 8.6* 8.7*   Liver Function Tests:  Recent Labs Lab 06/24/16 1720 06/25/16 1030  AST 33 32  ALT 23 21  ALKPHOS 92 94  BILITOT 4.3* 4.0*  PROT 7.5 7.5  ALBUMIN 4.2 4.0   No results for input(s): LIPASE, AMYLASE in the last 168 hours. No results for input(s): AMMONIA  in the last 168 hours. CBC:  Recent Labs Lab 06/24/16 1720 06/25/16 1030 06/27/16 1156 06/29/16 0433 06/30/16 0930  WBC 11.9* 13.8* 12.4* 12.9* 12.3*  NEUTROABS 7.8* 10.6* 7.6 7.8* 7.7  HGB 8.4* 8.6* 7.7* 7.6* 8.1*  HCT 25.8* 25.7* 23.1* 22.2* 23.2*  MCV 91.8 90.8 90.6 89.9 87.9  PLT 493* 494* 496* 481* 498*   Cardiac Enzymes: No results for input(s): CKTOTAL, CKMB, CKMBINDEX, TROPONINI in the last 168 hours. BNP (last 3 results)  Recent Labs  05/05/16 1206 05/16/16 1724 07/01/16 0535  BNP 639.0* 299.8* 539.3*    ProBNP (last 3 results) No results for input(s): PROBNP in the last 8760 hours.  CBG: No results for input(s): GLUCAP in the last 168 hours.  No results found for this or any previous visit (from the past 240 hour(s)).   Studies: Dg Chest 2 View  Result Date: 06/29/2016 CLINICAL DATA:  Sickle cell crisis with chest pain and shortness of breath. EXAM: CHEST  2 VIEW COMPARISON:  06/26/2016; 04/29/2016; chest CT - 05/06/2016 FINDINGS: Grossly unchanged enlarged cardiac silhouette and mediastinal contours. Stable position of support apparatus. Suspected slight worsening of linear heterogeneous potential airspace opacities within the right mid lung. Peripheral and basilar heterogeneous opacities are unchanged. No pleural effusion or pneumothorax. No evidence of edema. No acute osseus abnormalities. IMPRESSION: Worsening ill-defined right mid lung heterogeneous opacities could be indicative of early acute chest syndrome given provided history of sickle cell disease. Electronically Signed   By: Sandi Mariscal M.D.   On: 06/29/2016 12:37   Dg Chest 2 View  Result Date: 06/06/2016 CLINICAL DATA:  Rib pain. Shortness of breath today. History of sickle cell anemia. EXAM: CHEST  2 VIEW COMPARISON:  Radiographs 05/28/2016, most recent chest CT 05/06/2016 FINDINGS: Left chest port remains in place. Cardiomegaly is similar to prior. Question of worsening vascular congestion versus  differences in AP technique. No definite pleural effusion. No focal airspace disease. No pneumothorax. No acute rib abnormalities identified radiographically. IMPRESSION: Similar cardiomegaly. Question of worsening vascular congestion versus differences in technique. No new focal airspace disease. Electronically Signed   By: Jeb Levering M.D.   On: 06/06/2016 18:32   Dg Chest Port 1 View  Result Date: 06/26/2016 CLINICAL DATA:  Hypoxia, sickle cell EXAM: PORTABLE CHEST 1 VIEW COMPARISON:  06/06/2016 FINDINGS: Mild lingular and right lower lobe opacities, atelectasis versus pneumonia. No pleural effusion or pneumothorax. Cardiomegaly. Left chest power port terminates in the cavoatrial junction. IMPRESSION: Mild lingular and right lower lobe opacities, atelectasis versus pneumonia Electronically Signed   By: Julian Hy M.D.   On: 06/26/2016 07:16    Scheduled Meds: . carvedilol  3.125 mg Oral BID WC  .  cholecalciferol  2,000 Units Oral Daily  . folic acid  2 mg Oral Daily  . gabapentin  300 mg Oral TID  . HYDROmorphone   Intravenous Q4H  . hydroxyurea  1,000 mg Oral QODAY  . hydroxyurea  500 mg Oral QODAY  . L-glutamine  15 g Oral BID  . lisinopril  2.5 mg Oral QHS  . morphine  30 mg Oral Q12H  . potassium chloride SA  40 mEq Oral Daily  . rivaroxaban  20 mg Oral QHS  . senna-docusate  1 tablet Oral BID  . zolpidem  10 mg Oral QHS   Continuous Infusions: . 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 Greater than 50% involved face to face contact with the patient for assessment, counseling and coordination of care.

## 2016-07-02 LAB — CBC WITH DIFFERENTIAL/PLATELET
BASOS ABS: 0.1 10*3/uL (ref 0.0–0.1)
Basophils Relative: 1 %
EOS ABS: 0.7 10*3/uL (ref 0.0–0.7)
Eosinophils Relative: 6 %
HCT: 23.7 % — ABNORMAL LOW (ref 39.0–52.0)
Hemoglobin: 8.1 g/dL — ABNORMAL LOW (ref 13.0–17.0)
Lymphocytes Relative: 18 %
Lymphs Abs: 2.2 10*3/uL (ref 0.7–4.0)
MCH: 29.9 pg (ref 26.0–34.0)
MCHC: 34.2 g/dL (ref 30.0–36.0)
MCV: 87.5 fL (ref 78.0–100.0)
MONO ABS: 2.3 10*3/uL — AB (ref 0.1–1.0)
Monocytes Relative: 19 %
Neutro Abs: 7 10*3/uL (ref 1.7–7.7)
Neutrophils Relative %: 57 %
PLATELETS: 485 10*3/uL — AB (ref 150–400)
RBC: 2.71 MIL/uL — AB (ref 4.22–5.81)
RDW: 18.5 % — AB (ref 11.5–15.5)
WBC: 12.3 10*3/uL — AB (ref 4.0–10.5)

## 2016-07-02 LAB — BASIC METABOLIC PANEL
Anion gap: 5 (ref 5–15)
BUN: 13 mg/dL (ref 6–20)
CO2: 27 mmol/L (ref 22–32)
CREATININE: 0.76 mg/dL (ref 0.61–1.24)
Calcium: 8.6 mg/dL — ABNORMAL LOW (ref 8.9–10.3)
Chloride: 108 mmol/L (ref 101–111)
GFR calc Af Amer: >60 mL/min (ref >60)
Glucose, Bld: 111 mg/dL — ABNORMAL HIGH (ref 65–99)
Potassium: 4.3 mmol/L (ref 3.5–5.1)
SODIUM: 140 mmol/L (ref 135–145)

## 2016-07-02 LAB — RETICULOCYTES
RBC.: 2.71 MIL/uL — ABNORMAL LOW (ref 4.22–5.81)
RETIC CT PCT: 7.1 % — AB (ref 0.4–3.1)
Retic Count, Absolute: 192.4 10*3/uL — ABNORMAL HIGH (ref 19.0–186.0)

## 2016-07-02 MED ORDER — HEPARIN SOD (PORK) LOCK FLUSH 100 UNIT/ML IV SOLN
500.0000 [IU] | INTRAVENOUS | Status: AC | PRN
  Administered 2016-07-02: 500 [IU]

## 2016-07-02 NOTE — Progress Notes (Signed)
Patient's at rest pulse 78 and O2 sats 96% on 3L of O2.  Post ambulation pulse 80 and sats 97% on 3L of O2.

## 2016-07-02 NOTE — Progress Notes (Signed)
Wasted 21 ml of dilaudid pca into sink with Junius Creamer RN as witness.

## 2016-07-02 NOTE — Progress Notes (Signed)
Patient discharged to home, all discharge medications and instructions reviewed and questions answered. Patient declined wheelchair assistance to vehicle, states will ambulate.  

## 2016-07-02 NOTE — Discharge Summary (Signed)
[name] MRN: [id number] DOB/AGE: October 26, 1980 36 y.o.  Admit date: 06/25/2016 Discharge date: 07/02/2016  Primary Care Physician:  Tresa Garter, MD   Discharge Diagnoses:   Patient Active Problem List   Diagnosis Date Noted  . Cor pulmonale, chronic ([place]) 08/25/2014    Priority: High  . Pulmonary HTN ([place]) 06/18/2013    Priority: High  . Functional asplenia     Priority: High  . Anemia of chronic disease 06/25/2014    Priority: Medium  . Chronic respiratory failure with hypoxia ([place]) 03/14/2014    Priority: Medium  . Chronic anticoagulation 08/22/2013    Priority: Medium  . Avascular necrosis ([place])     Priority: Low  . Sickle cell disease with crisis ([place]) 06/25/2016  . Sickle cell crisis ([place]) 02/23/2016  . H/O arterial ischemic stroke 11/11/2015  . Slurred speech 11/10/2015  . Chronic anemia   . Hb-SS disease without crisis ([place]) 10/07/2014  . Prolonged Q-T interval on ECG 09/10/2014  . Troponin level elevated 08/29/2014  . Sickle cell anemia ([place]) 06/25/2014  . PAH (pulmonary artery hypertension) ([place]) 03/18/2014  . Paralytic strabismus, external ophthalmoplegia   . Chronic pain syndrome 12/12/2013  . Essential hypertension 08/22/2013  . Vitamin D deficiency 02/13/2013  . Hx of pulmonary embolus 06/29/2012  . 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   cholecalciferol 1000 units tablet Commonly known as:  VITAMIN D Take 2,000 Units by mouth daily.   folic acid 1 MG tablet Commonly known as:  FOLVITE Take 2 tablets (2 mg total) by mouth daily.   gabapentin 300 MG capsule Commonly known as:  NEURONTIN Take 1 capsule (300 mg total) by mouth 3 (three) times daily.   HYDROmorphone 4 MG tablet Commonly  known as:  DILAUDID Take by mouth every 4 (four) hours as needed for severe pain.   hydroxyurea 500 MG capsule Commonly known as:  HYDREA Take 1,000 mg by mouth every other day. May take with food to minimize GI side effects.   hydroxyurea 500 MG capsule Commonly known as:  HYDREA Take 500 mg by mouth every other day. May take with food to minimize GI side effects.   L-glutamine 5 g Pack Powder Packet Commonly known as:  ENDARI Take 15 g by mouth 2 (two) times daily.   LETAIRIS 5 MG tablet Generic drug:  ambrisentan Take 5 mg by mouth at bedtime.   lisinopril 2.5 MG tablet Commonly known as:  PRINIVIL,ZESTRIL Take 2.5 mg by mouth at bedtime.   morphine 30 MG 12 hr tablet Commonly known as:  MS CONTIN Take 1 tablet (30 mg total) by mouth every 12 (twelve) hours.   potassium chloride SA 20 MEQ tablet Commonly known as:  K-DUR,KLOR-CON Take 2 tablets (40 mEq total) by mouth daily.   rivaroxaban 20 MG Tabs tablet Commonly known as:  XARELTO Take 1 tablet (20 mg total) by mouth at bedtime.   torsemide 20 MG tablet Commonly known as:  DEMADEX Take 1 tablet (20 mg total) by mouth daily as needed (leg swelling).   zolpidem 10 MG tablet Commonly known as:  AMBIEN Take 1 tablet (10 mg total) by mouth at bedtime as needed for sleep. What changed:  when  to take this          SIGNIFICANT DIAGNOSTIC STUDIES:  Dg Chest 2 View  Result Date: 06/29/2016 CLINICAL DATA:  Sickle cell crisis with chest pain and shortness of breath. EXAM: CHEST  2 VIEW COMPARISON:  06/26/2016; 04/29/2016; chest CT - 05/06/2016 FINDINGS: Grossly unchanged enlarged cardiac silhouette and mediastinal contours. Stable position of support apparatus. Suspected slight worsening of linear heterogeneous potential airspace opacities within the right mid lung. Peripheral and basilar heterogeneous opacities are unchanged. No pleural effusion or pneumothorax. No evidence of edema. No acute osseus abnormalities.  IMPRESSION: Worsening ill-defined right mid lung heterogeneous opacities could be indicative of early acute chest syndrome given provided history of sickle cell disease. Electronically Signed   By: Sandi Mariscal M.D.   On: 06/29/2016 12:37   Dg Chest 2 View  Result Date: 06/06/2016 CLINICAL DATA:  Rib pain. Shortness of breath today. History of sickle cell anemia. EXAM: CHEST  2 VIEW COMPARISON:  Radiographs 05/28/2016, most recent chest CT 05/06/2016 FINDINGS: Left chest port remains in place. Cardiomegaly is similar to prior. Question of worsening vascular congestion versus differences in AP technique. No definite pleural effusion. No focal airspace disease. No pneumothorax. No acute rib abnormalities identified radiographically. IMPRESSION: Similar cardiomegaly. Question of worsening vascular congestion versus differences in technique. No new focal airspace disease. Electronically Signed   By: Jeb Levering M.D.   On: 06/06/2016 18:32   Dg Chest Port 1 View  Result Date: 06/26/2016 CLINICAL DATA:  Hypoxia, sickle cell EXAM: PORTABLE CHEST 1 VIEW COMPARISON:  06/06/2016 FINDINGS: Mild lingular and right lower lobe opacities, atelectasis versus pneumonia. No pleural effusion or pneumothorax. Cardiomegaly. Left chest power port terminates in the cavoatrial junction. IMPRESSION: Mild lingular and right lower lobe opacities, atelectasis versus pneumonia Electronically Signed   By: Julian Hy M.D.   On: 06/26/2016 07:16      No results found for this or any previous visit (from the past 240 hour(s)).  BRIEF ADMITTING H & P:  Patient is a 36 year old gentleman with history of sickle cell disease who was just discharged from the hospital about a week ago. Since then he's been back to sickle cell center and the emergency room twice. He is complaining of 9 out of 10 pain in his chest wall back and legs. He has taken all his home medications without relief. He has been seen in the ER and received up  to 3 doses of pain medication still without relief. Pain is still at 8 out of 10. Patient is opiates tolerant. He also has pulmonary hypertension home oxygen with history of recurrent pulmonary embolism. He's been known to be noncompliant with medications in the past. At this point we'll admit the patient for pain management   Hospital Course:  Present on Admission: . Sickle cell crisis ([place]) . Sickle cell disease with crisis [facility])  This is an Opiate tolerant patient with Hb SS who was admitted with pain which was felt to be sickle cell crisis. Pt was admitted and treated for pain with IV Dilaudid, Toradol and IVF. Pt has been receiving RBC pheresis at [facility] and on his last red cell pheresis which was 11 days prior to admission his hemoglobin as well as at 11%. This raises suspicion that this pain was likely not associated with a sickle cell crisis and that hemoglobin electrophoresis was performed by hospitalized. This showed a hemoglobin SS of 30% which is well below that of which would be contributing to  sickle cell crisis. This is then chronic pain which likely has exacerbated due to any number of factors least of which is the fact that the patient has not been using his oxygen consistently likely causing increased pain in the areas where there has been prior damage to the tissue. The patient was weaned off of IV Dilaudid and transition to his oral medications. I have spoken with his primary care physician about this and I've advised patient to seek follow-up care soon as possible to address management of his chronic pain.  Patient also has pulmonary care hypertension with cor pulmonale and had acute on chronic respiratory failure with hypoxia.  This is likely caused by his not wearing his oxygen on a continuous basis coupled with a lower level of hemoglobin at baseline. There was some concern for acute chest syndrome as the patient had hypoxia and a new finding on chest radiologic  studies. He was transfused one unit of blood in noted to raise the hemoglobin level. However with the findings on electrophoresis this cannot be acute chest syndrome and is likely representation of some new scarring of the lungs due to his chronic condition. Once his hemoglobin levels were above 8 g/dL his oxygen levels remained at 95% on 3 L of oxygen which is his baseline.  Patient has anemia of chronic disease with a baseline hemoglobin of 6.5-7.5 g/dL. On admission his hemoglobin was at baseline. However given his pulmonary arterial hypertension the goal  should be to keep his hemoglobin closer between 8 and 10 g/dL.  Patient has chronic pain syndrome and it is clear that his current medications are not adequately managing his pain. I recommended the patient be referred to a pain clinic for the management of his chronic pain and that adjunctive therapies also be considered with this patient including meditation, distraction, physical modalities obtained including a TENS unit for the chronic pain.  The patient is chronically anticoagulated secondary to repeat pulmonary emboli and he was continued on the Xarelto throughout his hospital course.  There is a large degree of medical noncompliance is contributing to this patient's chronic and acute medical condition. The patient has been advised on a myriad of occasions that he needs to wear his oxygen on a continuous basis. However the patient does not feel comfortable going into public places with his oxygen does he refuses to wear his oxygen at home. He admits that on a regular basis he is without his oxygen about 30 minutes twice a day. When asked about the oxygen he said he has no intention of wearing his oxygen in the public setting or outside of his home. Mr. Gala Romney will need reinforcement of the importance of wearing his oxygen continuously in order to exert influence and change his practices regarding the oxygen.     Disposition and Follow-up:  Patient is discharged home in stable condition. He has ongoing chronic pain that is poorly controlled and has been advised to follow-up within the next 3 days with his primary care physician to further address the management of chronic pain.  Discharge Instructions    Activity as tolerated - No restrictions    Complete by:  As directed    Diet - low sodium heart healthy    Complete by:  As directed       DISCHARGE EXAM:  General: Alert, awake, oriented x3, in mild distress secondary to pain.  HEENT: [place]/AT PEERL, EOMI, anicteric Neck: Trachea midline, no masses, no thyromegal,y no JVD, no carotid bruit  OROPHARYNX: Moist, No exudate/ erythema/lesions.  Heart: Regular rate and rhythm, II/VI SEM at right 2nd intercostal space.  No rubs or gallops. PMI non-displaced. Exam reveals no decreased pulses. Pulmonary/Chest: Normal effort. Breath sounds normal. No. Apnea. Clear to auscultation,no stridor,  no wheezing and no rhonchi noted. No respiratory distress and no tenderness noted. Abdomen: Soft, nontender, nondistended, normal bowel sounds, no masses no hepatosplenomegaly noted. No fluid wave and no ascites. There is no guarding or rebound. Neuro: Alert and oriented to person, place and time. Normal motor skills, Displays no atrophy or tremors and exhibits normal muscle tone.  No focal neurological deficits noted cranial nerves II through XII grossly intact. No sensory deficit noted. Strength at baseline in bilateral upper and lower extremities. Gait normal. Musculoskeletal: No warmth swelling or erythema around joints, no spinal tenderness noted. Psychiatric: Patient alert and oriented x3, flat affect, good insight and cognition, good recent to remote recall. Skin: Skin is warm and dry. No bruising, no ecchymosis and no rash noted. Pt is not diaphoretic. No erythema. No pallor   Blood pressure 109/78, pulse 80, temperature 98.1 F (36.7 C), temperature source Oral, resp. rate 18, height 6'  (1.829 m), weight 89.4 kg (197 lb 3.2 oz), SpO2 97 %.   Recent Labs  07/01/16 0535 07/02/16 0600  NA 140 140  K 4.4 4.3  CL 110 108  CO2 25 27  GLUCOSE 132* 111*  BUN 15 13  CREATININE 0.75 0.76  CALCIUM 8.7* 8.6*   No results for input(s): AST, ALT, ALKPHOS, BILITOT, PROT, ALBUMIN in the last 72 hours. No results for input(s): LIPASE, AMYLASE in the last 72 hours.  Recent Labs  06/30/16 0930 07/02/16 0600  WBC 12.3* 12.3*  NEUTROABS 7.7 7.0  HGB 8.1* 8.1*  HCT 23.2* 23.7*  MCV 87.9 87.5  PLT 498* 485*     Total time spent including face to face and decision making was greater than 30 minutes  Signed: Soua Caltagirone A. 07/02/2016, 2:43 PM

## 2016-07-02 NOTE — Progress Notes (Signed)
Patient's at rest pulse 75 and O2 sats 95% on 3L O2. Ambulatory pulse 93 with sats down to 78% on 3L O2.

## 2016-07-03 ENCOUNTER — Emergency Department (HOSPITAL_COMMUNITY): Payer: Medicare Other

## 2016-07-03 ENCOUNTER — Encounter (HOSPITAL_COMMUNITY): Payer: Self-pay | Admitting: *Deleted

## 2016-07-03 ENCOUNTER — Emergency Department (HOSPITAL_COMMUNITY)
Admission: EM | Admit: 2016-07-03 | Discharge: 2016-07-03 | Disposition: A | Discharge status: Home / Self Care | Payer: Medicare Other | Attending: Emergency Medicine | Admitting: Emergency Medicine

## 2016-07-03 DIAGNOSIS — Z87891 Personal history of nicotine dependence: Secondary | ICD-10-CM | POA: Insufficient documentation

## 2016-07-03 DIAGNOSIS — Z79899 Other long term (current) drug therapy: Secondary | ICD-10-CM | POA: Diagnosis not present

## 2016-07-03 DIAGNOSIS — I10 Essential (primary) hypertension: Secondary | ICD-10-CM | POA: Insufficient documentation

## 2016-07-03 DIAGNOSIS — Z96641 Presence of right artificial hip joint: Secondary | ICD-10-CM | POA: Diagnosis not present

## 2016-07-03 DIAGNOSIS — R079 Chest pain, unspecified: Secondary | ICD-10-CM | POA: Diagnosis present

## 2016-07-03 DIAGNOSIS — Z7901 Long term (current) use of anticoagulants: Secondary | ICD-10-CM | POA: Insufficient documentation

## 2016-07-03 DIAGNOSIS — D57219 Sickle-cell/Hb-C disease with crisis, unspecified: Secondary | ICD-10-CM | POA: Insufficient documentation

## 2016-07-03 DIAGNOSIS — D5700 Hb-SS disease with crisis, unspecified: Secondary | ICD-10-CM | POA: Diagnosis not present

## 2016-07-03 DIAGNOSIS — I517 Cardiomegaly: Secondary | ICD-10-CM | POA: Diagnosis not present

## 2016-07-03 LAB — COMPREHENSIVE METABOLIC PANEL
ALT: 37 U/L (ref 17–63)
AST: 60 U/L — ABNORMAL HIGH (ref 15–41)
Albumin: 4.5 g/dL (ref 3.5–5.0)
Alkaline Phosphatase: 116 U/L (ref 38–126)
Anion gap: 8 (ref 5–15)
BUN: 11 mg/dL (ref 6–20)
CO2: 25 mmol/L (ref 22–32)
Calcium: 9.4 mg/dL (ref 8.9–10.3)
Chloride: 104 mmol/L (ref 101–111)
Creatinine, Ser: 0.69 mg/dL (ref 0.61–1.24)
GFR calc Af Amer: >60 mL/min (ref >60)
GFR calc non Af Amer: >60 mL/min (ref >60)
Glucose, Bld: 114 mg/dL — ABNORMAL HIGH (ref 65–99)
Potassium: 4 mmol/L (ref 3.5–5.1)
Sodium: 137 mmol/L (ref 135–145)
Total Bilirubin: 5.8 mg/dL — ABNORMAL HIGH (ref 0.3–1.2)
Total Protein: 8.3 g/dL — ABNORMAL HIGH (ref 6.5–8.1)

## 2016-07-03 LAB — CBC WITH DIFFERENTIAL/PLATELET
BASOS PCT: 0 %
Basophils Absolute: 0.1 10*3/uL (ref 0.0–0.1)
EOS ABS: 0.1 10*3/uL (ref 0.0–0.7)
EOS PCT: 1 %
HCT: 26 % — ABNORMAL LOW (ref 39.0–52.0)
Hemoglobin: 8.9 g/dL — ABNORMAL LOW (ref 13.0–17.0)
Lymphocytes Relative: 9 %
Lymphs Abs: 1.2 10*3/uL (ref 0.7–4.0)
MCH: 28.9 pg (ref 26.0–34.0)
MCHC: 34.2 g/dL (ref 30.0–36.0)
MCV: 84.4 fL (ref 78.0–100.0)
MONO ABS: 1.9 10*3/uL — AB (ref 0.1–1.0)
Monocytes Relative: 14 %
NEUTROS PCT: 76 %
Neutro Abs: 9.9 10*3/uL — ABNORMAL HIGH (ref 1.7–7.7)
PLATELETS: 502 10*3/uL — AB (ref 150–400)
RBC: 3.08 MIL/uL — ABNORMAL LOW (ref 4.22–5.81)
RDW: 18.3 % — AB (ref 11.5–15.5)
WBC: 13.1 10*3/uL — ABNORMAL HIGH (ref 4.0–10.5)

## 2016-07-03 LAB — RETICULOCYTES
RBC.: 3.08 MIL/uL — ABNORMAL LOW (ref 4.22–5.81)
Retic Count, Absolute: 160.2 10*3/uL (ref 19.0–186.0)
Retic Ct Pct: 5.2 % — ABNORMAL HIGH (ref 0.4–3.1)

## 2016-07-03 MED ORDER — HEPARIN SOD (PORK) LOCK FLUSH 100 UNIT/ML IV SOLN
500.0000 [IU] | Freq: Once | INTRAVENOUS | Status: AC
  Administered 2016-07-03: 500 [IU]
  Filled 2016-07-03: qty 5

## 2016-07-03 MED ORDER — DIPHENHYDRAMINE HCL 25 MG PO CAPS
25.0000 mg | ORAL_CAPSULE | ORAL | Status: DC | PRN
  Administered 2016-07-03: 50 mg via ORAL
  Filled 2016-07-03: qty 2

## 2016-07-03 MED ORDER — HYDROMORPHONE HCL 1 MG/ML IJ SOLN
2.0000 mg | INTRAMUSCULAR | Status: AC
  Administered 2016-07-03: 2 mg via INTRAVENOUS
  Filled 2016-07-03: qty 2

## 2016-07-03 MED ORDER — HYDROMORPHONE HCL 1 MG/ML IJ SOLN: 2.0000 mg | INTRAMUSCULAR | Status: AC

## 2016-07-03 NOTE — ED Triage Notes (Signed)
Pt w/ hx of sickle cell anemia complains of pain in bilateral upper legs since this morning. Pt states pain feels like sickle cell pain crisis. Pt states he has tried taking home dilaudid without relief.

## 2016-07-03 NOTE — ED Notes (Signed)
Pt's O2 sats dropped to 88 on room air. Pt placed back on 2L [place]. MD notified.

## 2016-07-03 NOTE — Discharge Instructions (Signed)
We saw you in the ER for your sickle cell related pain. °The labs and imaging indicate no true crisis at this time. We understand you are in sickle cell related pain none the less, and advise to call your sickle cell doctor for a followup as soon as possible. °If your symptoms get worse, return to the ER. ° °

## 2016-07-04 ENCOUNTER — Encounter (HOSPITAL_COMMUNITY): Payer: Self-pay | Admitting: Emergency Medicine

## 2016-07-04 ENCOUNTER — Encounter (HOSPITAL_COMMUNITY): Payer: Self-pay | Admitting: *Deleted

## 2016-07-04 ENCOUNTER — Non-Acute Institutional Stay (HOSPITAL_COMMUNITY)
Admission: AD | Admit: 2016-07-04 | Discharge: 2016-07-04 | Disposition: A | Discharge status: Home / Self Care | Payer: Medicare Other | Source: Ambulatory Visit | Attending: Internal Medicine | Admitting: Internal Medicine

## 2016-07-04 ENCOUNTER — Emergency Department (HOSPITAL_COMMUNITY)
Admission: EM | Admit: 2016-07-04 | Discharge: 2016-07-04 | Disposition: A | Discharge status: Home / Self Care | Payer: Medicare Other | Source: Home / Self Care | Attending: Emergency Medicine | Admitting: Emergency Medicine

## 2016-07-04 ENCOUNTER — Telehealth: Payer: Self-pay

## 2016-07-04 DIAGNOSIS — Z96641 Presence of right artificial hip joint: Secondary | ICD-10-CM | POA: Insufficient documentation

## 2016-07-04 DIAGNOSIS — Z79891 Long term (current) use of opiate analgesic: Secondary | ICD-10-CM | POA: Insufficient documentation

## 2016-07-04 DIAGNOSIS — Z7901 Long term (current) use of anticoagulants: Secondary | ICD-10-CM | POA: Insufficient documentation

## 2016-07-04 DIAGNOSIS — G894 Chronic pain syndrome: Secondary | ICD-10-CM

## 2016-07-04 DIAGNOSIS — Z8673 Personal history of transient ischemic attack (TIA), and cerebral infarction without residual deficits: Secondary | ICD-10-CM | POA: Insufficient documentation

## 2016-07-04 DIAGNOSIS — Z86718 Personal history of other venous thrombosis and embolism: Secondary | ICD-10-CM | POA: Diagnosis not present

## 2016-07-04 DIAGNOSIS — I10 Essential (primary) hypertension: Secondary | ICD-10-CM

## 2016-07-04 DIAGNOSIS — Z832 Family history of diseases of the blood and blood-forming organs and certain disorders involving the immune mechanism: Secondary | ICD-10-CM | POA: Insufficient documentation

## 2016-07-04 DIAGNOSIS — Z86711 Personal history of pulmonary embolism: Secondary | ICD-10-CM | POA: Diagnosis not present

## 2016-07-04 DIAGNOSIS — Z79899 Other long term (current) drug therapy: Secondary | ICD-10-CM | POA: Insufficient documentation

## 2016-07-04 DIAGNOSIS — Z87891 Personal history of nicotine dependence: Secondary | ICD-10-CM

## 2016-07-04 DIAGNOSIS — D5700 Hb-SS disease with crisis, unspecified: Secondary | ICD-10-CM | POA: Insufficient documentation

## 2016-07-04 DIAGNOSIS — Z862 Personal history of diseases of the blood and blood-forming organs and certain disorders involving the immune mechanism: Secondary | ICD-10-CM | POA: Insufficient documentation

## 2016-07-04 DIAGNOSIS — D571 Sickle-cell disease without crisis: Secondary | ICD-10-CM

## 2016-07-04 LAB — COMPREHENSIVE METABOLIC PANEL
ALK PHOS: 119 U/L (ref 38–126)
ALT: 36 U/L (ref 17–63)
ANION GAP: 10 (ref 5–15)
AST: 59 U/L — ABNORMAL HIGH (ref 15–41)
Albumin: 4.4 g/dL (ref 3.5–5.0)
BUN: 13 mg/dL (ref 6–20)
CALCIUM: 9.5 mg/dL (ref 8.9–10.3)
CO2: 24 mmol/L (ref 22–32)
Chloride: 104 mmol/L (ref 101–111)
Creatinine, Ser: 0.71 mg/dL (ref 0.61–1.24)
GFR calc Af Amer: >60 mL/min (ref >60)
GFR calc non Af Amer: >60 mL/min (ref >60)
GLUCOSE: 111 mg/dL — AB (ref 65–99)
Potassium: 4 mmol/L (ref 3.5–5.1)
SODIUM: 138 mmol/L (ref 135–145)
Total Bilirubin: 6.7 mg/dL — ABNORMAL HIGH (ref 0.3–1.2)
Total Protein: 8.3 g/dL — ABNORMAL HIGH (ref 6.5–8.1)

## 2016-07-04 LAB — RETICULOCYTES
RBC.: 3.06 MIL/uL — AB (ref 4.22–5.81)
RETIC COUNT ABSOLUTE: 153 10*3/uL (ref 19.0–186.0)
Retic Ct Pct: 5 % — ABNORMAL HIGH (ref 0.4–3.1)

## 2016-07-04 LAB — CBC WITH DIFFERENTIAL/PLATELET
BASOS ABS: 0.1 10*3/uL (ref 0.0–0.1)
Basophils Relative: 1 %
EOS ABS: 0.1 10*3/uL (ref 0.0–0.7)
Eosinophils Relative: 1 %
HCT: 26.1 % — ABNORMAL LOW (ref 39.0–52.0)
HEMOGLOBIN: 9 g/dL — AB (ref 13.0–17.0)
LYMPHS ABS: 1.4 10*3/uL (ref 0.7–4.0)
LYMPHS PCT: 10 %
MCH: 29.4 pg (ref 26.0–34.0)
MCHC: 34.5 g/dL (ref 30.0–36.0)
MCV: 85.3 fL (ref 78.0–100.0)
MONO ABS: 2.1 10*3/uL — AB (ref 0.1–1.0)
Monocytes Relative: 15 %
NEUTROS PCT: 73 %
Neutro Abs: 10.2 10*3/uL — ABNORMAL HIGH (ref 1.7–7.7)
PLATELETS: 518 10*3/uL — AB (ref 150–400)
RBC: 3.06 MIL/uL — ABNORMAL LOW (ref 4.22–5.81)
RDW: 18.4 % — ABNORMAL HIGH (ref 11.5–15.5)
WBC: 13.9 10*3/uL — ABNORMAL HIGH (ref 4.0–10.5)

## 2016-07-04 MED ORDER — DEXTROSE-NACL 5-0.45 % IV SOLN
INTRAVENOUS | Status: DC
  Administered 2016-07-04: 11:00:00 via INTRAVENOUS

## 2016-07-04 MED ORDER — KETOROLAC TROMETHAMINE 30 MG/ML IJ SOLN
30.0000 mg | Freq: Once | INTRAMUSCULAR | Status: AC
  Administered 2016-07-04: 30 mg via INTRAVENOUS
  Filled 2016-07-04: qty 1

## 2016-07-04 MED ORDER — HYDROMORPHONE HCL 4 MG PO TABS
4.0000 mg | ORAL_TABLET | Freq: Once | ORAL | Status: AC
  Administered 2016-07-04: 4 mg via ORAL
  Filled 2016-07-04: qty 1

## 2016-07-04 MED ORDER — HEPARIN SOD (PORK) LOCK FLUSH 100 UNIT/ML IV SOLN
500.0000 [IU] | INTRAVENOUS | Status: AC | PRN
  Administered 2016-07-04: 500 [IU]
  Filled 2016-07-04: qty 5

## 2016-07-04 MED ORDER — SODIUM CHLORIDE 0.9% FLUSH
10.0000 mL | INTRAVENOUS | Status: AC | PRN
  Administered 2016-07-04: 10 mL

## 2016-07-04 MED ORDER — HYDROMORPHONE HCL 2 MG PO TABS
4.0000 mg | ORAL_TABLET | Freq: Once | ORAL | Status: AC
  Administered 2016-07-04: 4 mg via ORAL
  Filled 2016-07-04: qty 2

## 2016-07-04 MED ORDER — HYDROMORPHONE HCL 4 MG PO TABS
8.0000 mg | ORAL_TABLET | Freq: Once | ORAL | Status: AC
  Administered 2016-07-04: 8 mg via ORAL
  Filled 2016-07-04: qty 2

## 2016-07-04 NOTE — Discharge Instructions (Signed)
Sickle Cell Anemia, Adult °Sickle cell anemia is a condition where your red blood cells are shaped like sickles. Red blood cells carry oxygen through the body. Sickle-shaped red blood cells do not live as long as normal red blood cells. They also clump together and block blood from flowing through the blood vessels. These things prevent the body from getting enough oxygen. Sickle cell anemia causes organ damage and pain. It also increases the risk of infection. °Follow these instructions at home: °· Drink enough fluid to keep your pee (urine) clear or pale yellow. Drink more in hot weather and during exercise. °· Do not smoke. Smoking lowers oxygen levels in the blood. °· Only take over-the-counter or prescription medicines as told by your doctor. °· Take antibiotic medicines as told by your doctor. Make sure you finish them even if you start to feel better. °· Take supplements as told by your doctor. °· Consider wearing a medical alert bracelet. This tells anyone caring for you in an emergency of your condition. °· When traveling, keep your medical information, doctors' names, and the medicines you take with you at all times. °· If you have a fever, do not take fever medicines right away. This could cover up a problem. Tell your doctor. °· Keep all follow-up visits with your doctor. Sickle cell anemia requires regular medical care. °Contact a doctor if: °You have a fever. °Get help right away if: °· You feel dizzy or faint. °· You have new belly (abdominal) pain, especially on the left side near the stomach area. °· You have a lasting, often uncomfortable and painful erection of the penis (priapism). If it is not treated right away, you will become unable to have sex (impotence). °· You have numbness in your arms or legs or you have a hard time moving them. °· You have a hard time talking. °· You have a fever or lasting symptoms for more than 2-3 days. °· You have a fever and your symptoms suddenly get  worse. °· You have signs or symptoms of infection. These include: °? Chills. °? Being more tired than normal (lethargy). °? Irritability. °? Poor eating. °? Throwing up (vomiting). °· You have pain that is not helped with medicine. °· You have shortness of breath. °· You have pain in your chest. °· You are coughing up pus-like or bloody mucus. °· You have a stiff neck. °· Your feet or hands swell or have pain. °· Your belly looks bloated. °· Your joints hurt. °This information is not intended to replace advice given to you by your health care provider. Make sure you discuss any questions you have with your health care provider. °Document Released: 11/07/2012 Document Revised: 06/25/2015 Document Reviewed: 08/29/2012 °Elsevier Interactive Patient Education © 2017 [employer] ° °

## 2016-07-04 NOTE — H&P (Signed)
[facility] History and Physical   Date: 07/04/2016  Patient name: [name] Medical record number: [id number] Date of birth: 02-03-1980 Age: 36 y.o. Gender: male PCP: Tresa Garter, MD  Attending physician: Tresa Garter, MD  Chief Complaint: Pain   History of Present Illness:  [name] is a 36 y.o. male with a diagnosis of Sickle Cell Anemia, type Hb-SS, presents today with a complaint of generalized pain. Dr. Di Kindle phoned from [facility] Emergency department to advise that [name] was medically stable and requested that he be transferred to the day infusion center for further pain management.  [name] was recently admitted to inpatient services 06/25/2016 and discharged 07/02/2016. He subsequently presented back to the emergency department one day after discharge and again today. Both encounters, he was deemed medically stable. While in the ED today, he has only received one dose of Dilaudid 4 mg PO. He arrived ambulatory to the clinic and reported that he was feeling "fine" with generalized pain 8/10. Characterized pain as aching. Patient denies headache, fever, shortness of breath, chest pain, dysuria, nausea, vomiting, or diarrhea. [name] is being admitted to the day infusion center for extended Observation.   Meds:  .  carvedilol (COREG) 3.125 MG tablet, Take 1 tablet (3.125 mg total) by mouth 2 (two) times daily with a meal., Disp: , Rfl:  .  cholecalciferol (VITAMIN D) 1000 units tablet, Take 2,000 Units by mouth daily., Disp: , Rfl:  .  folic acid (FOLVITE) 1 MG tablet, Take 2 tablets (2 mg total) by mouth daily., Disp: , Rfl:  .  gabapentin (NEURONTIN) 300 MG capsule, Take 1 capsule (300 mg total) by mouth 3 (three) times daily., Disp: 270 capsule, Rfl: 3 .  HYDROmorphone (DILAUDID) 4 MG tablet, Take by mouth every 4 (four) hours as needed for severe pain., Disp: , Rfl:  .  hydroxyurea (HYDREA) 500 MG capsule, Take 500 mg by mouth every  other day. May take with food to minimize GI side effects., Disp: , Rfl:  .  hydroxyurea (HYDREA) 500 MG capsule, Take 1,000 mg by mouth every other day. May take with food to minimize GI side effects., Disp: , Rfl:  .  L-glutamine (ENDARI) 5 g PACK Powder Packet, Take 15 g by mouth 2 (two) times daily., Disp: 180 packet, Rfl: 1 .  lisinopril (PRINIVIL,ZESTRIL) 2.5 MG tablet, Take 2.5 mg by mouth at bedtime. , Disp: , Rfl:  .  morphine (MS CONTIN) 30 MG 12 hr tablet, Take 1 tablet (30 mg total) by mouth every 12 (twelve) hours., Disp: 60 tablet, Rfl: 0 .  potassium chloride SA (K-DUR,KLOR-CON) 20 MEQ tablet, Take 2 tablets (40 mEq total) by mouth daily., Disp: 30 tablet, Rfl: 2 .  rivaroxaban (XARELTO) 20 MG TABS tablet, Take 1 tablet (20 mg total) by mouth at bedtime., Disp: 30 tablet, Rfl: 3 .  zolpidem (AMBIEN) 10 MG tablet, Take 1 tablet (10 mg total) by mouth at bedtime as needed for sleep. (Patient taking differently: Take 10 mg by mouth at bedtime. ), Disp: 30 tablet, Rfl: 0 .  LETAIRIS 5 MG tablet, Take 5 mg by mouth at bedtime. , Disp: , Rfl:  .  torsemide (DEMADEX) 20 MG tablet, Take 1 tablet (20 mg total) by mouth daily as needed (leg swelling)., Disp: , Rfl:   Allergies: Patient has no known allergies. Past Medical History:  Diagnosis Date  . Acute chest syndrome ([place]) 06/18/2013  . Acute embolism and thrombosis of right  internal jugular vein ([place])   . Alcohol consumption of one to four drinks per day   . Avascular necrosis (HCC)    Right Hip  . Blood transfusion   . Chronic anticoagulation   . Demand ischemia ([place]) 01/02/2014  . Former smoker   . Functional asplenia   . Hb-SS disease with crisis ([place])   . History of Clostridium difficile infection   . History of pulmonary embolus (PE)   . Hypertension   . Hypokalemia   . Leukocytosis    Chronic  . Mood disorder ([place])   . Noncompliance with medication regimen   . Oxygen deficiency   . Pulmonary hypertension ([place])   .  Second hand tobacco smoke exposure   . Sickle cell anemia (HCC)   . Sickle-cell crisis with associated acute chest syndrome ([place]) 05/13/2013  . Stroke ([place])   . Thrombocytosis (HCC)    Chronic  . Uses marijuana    Past Surgical History:  Procedure Laterality Date  . CHOLECYSTECTOMY     01/2008  . Excision of left periauricular cyst     10/2009  . Excision of right ear lobe cyst with primary closur     11/2007  . Porta cath placement    . Porta cath removal    . PORTACATH PLACEMENT  01/05/2012   Procedure: INSERTION PORT-A-CATH;  Surgeon: Odis Hollingshead, MD;  Location: [facility];  Service: General;  Laterality: N/A;  ultrasound guiced port a cath insertion with fluoroscopy  . Right hip replacement     08/2006  . UMBILICAL HERNIA REPAIR     01/2008   Family History  Problem Relation Age of Onset  . Sickle cell trait Mother   . Depression Mother   . Diabetes Mother   . Sickle cell trait Father   . Sickle cell trait Brother    Social History   Social History  . Marital status: Single    Spouse name: N/A  . Number of children: 0  . Years of education: 13   Occupational History  . 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 Smoking status: Former Smoker    Packs/day: 0.50    Years: 10.00    Types: Cigarettes    Quit date: 05/29/2011  . Smokeless tobacco: Never Used  . Alcohol use No  . Drug use: No     Comment: Marijuana weekly  . Sexual activity: Yes    Partners: Female     Comment: month ago   Other Topics Concern  . Not on file   Social History Narrative   Lives in an apartment.  Single.  Lives alone but has a girlfriend that helps care for him.  Does not use any assist devices.        [name]:  [phone] Mom, emergency contact       Pulmonary:   Patient continuing to live in her apartment in town alone. 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   Review of Systems: Review of Systems   Constitutional: Negative.   Respiratory: Negative.   Cardiovascular: Negative.   Musculoskeletal: Positive for joint pain.  Neurological: Negative.    Physical Exam: Blood pressure 115/84, pulse 75, temperature 98.9 F (37.2 C), temperature source Oral, resp. rate 16, height 6' (1.829 m), weight 197 lb (89.4 kg), SpO2 95 %.   General Appearance:    Alert, cooperative, no distress, appears stated  age  Head:    Normocephalic, without obvious abnormality, atraumatic  Eyes:    PERRL, conjunctiva/corneas clear, EOM's intact  Back:     Symmetric, no curvature, ROM normal, no CVA tenderness  Lungs:     Clear to auscultation bilaterally, respirations unlabored  Chest Wall:    No tenderness or deformity   Heart:    Regular rate and rhythm, S1 and S2 normal, no murmur, rub   or gallop  Abdomen:     Soft, non-tender, bowel sounds active all four quadrants,    no masses, no organomegaly  Extremities:   Extremities normal, atraumatic, no cyanosis or edema  Pulses:   2+ and symmetric all extremities  Skin:   Skin color, texture, turgor normal, no rashes or lesions  Neurologic:   Normal strength for patient's baseline and alteration in   speech pattern is patient's baseline    Lab results: -Pending   Imaging results:  Dg Chest 2 View  Result Date: 07/03/2016 CLINICAL DATA:  Sickle cell anemia.  Pain crisis. EXAM: CHEST  2 VIEW COMPARISON:  06/29/2016 FINDINGS: Left Port-A-Cath remains in place, unchanged. Cardiomegaly with vascular congestion. No confluent opacities or effusions. No acute bony abnormality. IMPRESSION: Cardiomegaly with vascular congestion. Electronically Signed   By: Rolm Baptise M.D.   On: 07/03/2016 18:51     Assessment & Plan:  Patient will be admitted to the day infusion center for extended observation  Start IV D5.45 for cellular rehydration at 125/hr   Start Toradol 30 mg IV every 6 hours for inflammation.  Administer PO Hydromorphone 8 mg and evaluate for  necessity of additional dosing.   Patient will be re-evaluated for pain intensity in the context of function and relationship to baseline as care progresses.  If no significant pain relief, will transfer patient to inpatient services for a higher level of care.   Will check CMP, CBC w/differential, Reticulocyte.      Molli Barrows 07/04/2016, 3:43 PM

## 2016-07-04 NOTE — ED Notes (Signed)
Bed: WA22 Expected date:  Expected time:  Means of arrival:  Comments: 

## 2016-07-04 NOTE — ED Notes (Signed)
PT request labs to come from port  

## 2016-07-04 NOTE — ED Provider Notes (Signed)
[facility] DEPT Provider Note   CSN: [id number] Arrival date & time: 07/04/16  4098     History   Chief Complaint Chief Complaint  Patient presents with  . Sickle Cell Pain Crisis    HPI [name] is a 36 y.o. male.  Patient with hx sickle cell anemia, and chronic pain syndrome, c/o pain bilateral torso area that he says is consistent with his prior sickle cell pain. Pain present for past week. No abrupt/acute change this AM.  No cough or uri symptoms. No fever or chills. No shortness of breath. Pain moderate-severe, constant, non radiating, without specific exacerbating factors. It is not pleuritic. Patient d/c from hospital 2 days ago, and was in ED yesterday c/o similar pain.    The history is provided by the patient.  Sickle Cell Pain Crisis  Associated symptoms: no chest pain, no cough, no fever, no headaches, no shortness of breath and no sore throat     Past Medical History:  Diagnosis Date  . Acute chest syndrome ([place]) 06/18/2013  . Acute embolism and thrombosis of right internal jugular vein ([place])   . Alcohol consumption of one to four drinks per day   . Avascular necrosis (HCC)    Right Hip  . Blood transfusion   . Chronic anticoagulation   . Demand ischemia ([place]) 01/02/2014  . Former smoker   . Functional asplenia   . Hb-SS disease with crisis ([place])   . History of Clostridium difficile infection   . History of pulmonary embolus (PE)   . Hypertension   . Hypokalemia   . Leukocytosis    Chronic  . Mood disorder ([place])   . Noncompliance with medication regimen   . Oxygen deficiency   . Pulmonary hypertension ([place])   . Second hand tobacco smoke exposure   . Sickle cell anemia (HCC)   . Sickle-cell crisis with associated acute chest syndrome ([place]) 05/13/2013  . Stroke ([place])   . Thrombocytosis (HCC)    Chronic  . Uses marijuana     Patient Active Problem List   Diagnosis Date Noted  . Sickle cell disease with crisis ([place]) 06/25/2016  . Sickle  cell crisis ([place]) 02/23/2016  . H/O arterial ischemic stroke 11/11/2015  . Slurred speech 11/10/2015  . Chronic anemia   . Hb-SS disease without crisis ([place]) 10/07/2014  . Prolonged Q-T interval on ECG 09/10/2014  . Troponin level elevated 08/29/2014  . Cor pulmonale, chronic ([place]) 08/25/2014  . Sickle cell anemia ([place]) 06/25/2014  . Anemia of chronic disease 06/25/2014  . PAH (pulmonary artery hypertension) ([place]) 03/18/2014  . Chronic respiratory failure with hypoxia ([place]) 03/14/2014  . Paralytic strabismus, external ophthalmoplegia   . Chronic pain syndrome 12/12/2013  . Chronic anticoagulation 08/22/2013  . Essential hypertension 08/22/2013  . Pulmonary HTN ([place]) 06/18/2013  . Functional asplenia   . Vitamin D deficiency 02/13/2013  . Hx of pulmonary embolus 06/29/2012  . 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 Avascular necrosis [facility])     Past Surgical History:  Procedure Laterality Date  . CHOLECYSTECTOMY     01/2008  . Excision of left periauricular cyst     10/2009  . Excision of right ear lobe cyst with primary closur     11/2007  . Porta cath placement    . Porta cath removal    . PORTACATH PLACEMENT  01/05/2012   Procedure: INSERTION PORT-A-CATH;  Surgeon: Odis Hollingshead, MD;  Location: [facility];  Service: General;  Laterality: N/A;  ultrasound  guiced port a cath insertion with fluoroscopy  . Right hip replacement     08/2006  . UMBILICAL HERNIA REPAIR     01/2008       Home Medications    Prior to Admission medications   Medication Sig Start Date End Date Taking? Authorizing Provider  carvedilol (COREG) 3.125 MG tablet Take 1 tablet (3.125 mg total) by mouth 2 (two) times daily with a meal. 05/17/16   [name], MD  cholecalciferol (VITAMIN D) 1000 units tablet Take 2,000 Units by mouth daily.    [provider]  folic acid (FOLVITE) 1 MG tablet Take 2 tablets (2 mg total) by mouth daily. 04/25/16   [name], MD  gabapentin  (NEURONTIN) 300 MG capsule Take 1 capsule (300 mg total) by mouth 3 (three) times daily. 06/01/15   Tresa Garter, MD  HYDROmorphone (DILAUDID) 4 MG tablet Take by mouth every 4 (four) hours as needed for severe pain.    [provider]  hydroxyurea (HYDREA) 500 MG capsule Take 500 mg by mouth every other day. May take with food to minimize GI side effects. 06/25/16   [provider]  hydroxyurea (HYDREA) 500 MG capsule Take 1,000 mg by mouth every other day. May take with food to minimize GI side effects. 06/24/16   [provider]  L-glutamine (ENDARI) 5 g PACK Powder Packet Take 15 g by mouth 2 (two) times daily. 04/25/16   [name], MD  LETAIRIS 5 MG tablet Take 5 mg by mouth at bedtime.  06/15/16   [provider]  lisinopril (PRINIVIL,ZESTRIL) 2.5 MG tablet Take 2.5 mg by mouth at bedtime.  05/22/16   [provider]  morphine (MS CONTIN) 30 MG 12 hr tablet Take 1 tablet (30 mg total) by mouth every 12 (twelve) hours. 06/02/16 07/03/17  Tresa Garter, MD  potassium chloride SA (K-DUR,KLOR-CON) 20 MEQ tablet Take 2 tablets (40 mEq total) by mouth daily. 04/27/16   [name], MD  rivaroxaban (XARELTO) 20 MG TABS tablet Take 1 tablet (20 mg total) by mouth at bedtime. 06/23/16   Dorena Dew, FNP  torsemide (DEMADEX) 20 MG tablet Take 1 tablet (20 mg total) by mouth daily as needed (leg swelling). 06/13/16   [name], MD  zolpidem (AMBIEN) 10 MG tablet Take 1 tablet (10 mg total) by mouth at bedtime as needed for sleep. Patient taking differently: Take 10 mg by mouth at bedtime.  06/02/16 07/03/17  Tresa Garter, MD    Family History Family History  Problem Relation Age of Onset  . Sickle cell trait Mother   . Depression Mother   . Diabetes Mother   . Sickle cell trait Father   . Sickle cell trait Brother     Social History Social History  Substance Use Topics  . Smoking status: Former Smoker     Packs/day: 0.50    Years: 10.00    Types: Cigarettes    Quit date: 05/29/2011  . Smokeless tobacco: Never Used  . Alcohol use No     Allergies   Patient has no known allergies.   Review of Systems Review of Systems  Constitutional: Negative for fever.  HENT: Negative for sore throat.   Eyes: Negative for redness.  Respiratory: Negative for cough and shortness of breath.   Cardiovascular: Negative for chest pain and leg swelling.  Gastrointestinal: Negative for abdominal pain.  Genitourinary: Negative for flank pain.  Musculoskeletal: Negative for  back pain and neck pain.  Skin: Negative for rash.  Neurological: Negative for headaches.  Hematological: Does not bruise/bleed easily.  Psychiatric/Behavioral: Negative for confusion.     Physical Exam Updated Vital Signs BP 107/82 (BP Location: Left Arm)   Pulse 88   Temp 98.6 F (37 C) (Oral)   Resp 16   SpO2 94%   Physical Exam  Constitutional: He appears well-developed and well-nourished. No distress.  HENT:  Mouth/Throat: Oropharynx is clear and moist.  Eyes: Conjunctivae are normal.  Neck: Neck supple. No tracheal deviation present.  Cardiovascular: Normal rate, regular rhythm, normal heart sounds and intact distal pulses.  Exam reveals no gallop and no friction rub.   No murmur heard. Pulmonary/Chest: Effort normal and breath sounds normal. No accessory muscle usage. No respiratory distress. He exhibits no tenderness.  Abdominal: Soft. Bowel sounds are normal. He exhibits no distension. There is no tenderness.  Genitourinary:  Genitourinary Comments: No cva tenderness  Musculoskeletal: He exhibits no edema.  Neurological: He is alert.  Skin: Skin is warm and dry. He is not diaphoretic.  Psychiatric: He has a normal mood and affect.  Nursing note and vitals reviewed.    ED Treatments / Results  Labs (all labs ordered are listed, but only abnormal results are displayed) Labs Reviewed - No data to  display  EKG  EKG Interpretation None       Radiology Dg Chest 2 View  Result Date: 07/03/2016 CLINICAL DATA:  Sickle cell anemia.  Pain crisis. EXAM: CHEST  2 VIEW COMPARISON:  06/29/2016 FINDINGS: Left Port-A-Cath remains in place, unchanged. Cardiomegaly with vascular congestion. No confluent opacities or effusions. No acute bony abnormality. IMPRESSION: Cardiomegaly with vascular congestion. Electronically Signed   By: Rolm Baptise M.D.   On: 07/03/2016 18:51    Procedures Procedures (including critical care time)  Medications Ordered in ED Medications  HYDROmorphone (DILAUDID) tablet 4 mg (not administered)     Initial Impression / Assessment and Plan / ED Course  I have reviewed the triage vital signs and the nursing notes.  Pertinent labs & imaging results that were available during my care of the patient were reviewed by me and considered in my medical decision making (see chart for details).  Reviewed nursing notes and prior charts for additional history.   Patient with multiple ED visits, and 2 admissions for same in past couple weeks.   It seems highly improbable that patient is having daily acute sickle cell pain crisis.   I discussed case with inpatient sickle cell physician, Dr Zigmund Daniel, who feels based on electrophoresis results, hgb s levels, etc that pt likely is not having acute sickle cell crisis.   I also discussed pt with sickle cell clinic provider  - she requests we send patient to the sickle cell clinic so that we can evaluate there.   Dose of dilaudid po, and patient arranged to go directly to sickle cell clinic.   His existing lab results, from yesterday afternoon, are current and were reviewed.   Vital signs normal, afeb.    Patient appears stable for d/c, to go to sickle cell clinic.     Final Clinical Impressions(s) / ED Diagnoses   Final diagnoses:  History of sickle cell anemia  Chronic pain syndrome    New Prescriptions New Prescriptions    No medications on file     Lajean Saver, MD 07/04/16 [phone]

## 2016-07-04 NOTE — ED Triage Notes (Signed)
Pt complaint of bilateral side pain described as normal SCC pain. Pt denies GU symptoms. Onset yesterday.

## 2016-07-04 NOTE — Discharge Instructions (Signed)
It was our pleasure to provide your ER care today - we hope that you feel better.  We  discussed your case with your sickle cell clinic providers - they indicated for [place] to send you over to the sickle cell clinic so that they could see you there.   Return to ER if worse, new symptoms, fevers, difficulty breathing, other concern.  You were given pain medication in the ER - no driving for the next 6 hours, or any time when taking narcotic pain medication.

## 2016-07-04 NOTE — H&P (Signed)
Erroneous note     Review of Systems  Physical Exam

## 2016-07-04 NOTE — ED Notes (Signed)
Patient is being discharged to the [facility]. Spoke with Meredith Mody, Therapist, sports. Patient is A&Ox4 and ambulatory. Will walk over to clinic.

## 2016-07-04 NOTE — Discharge Summary (Signed)
[facility] Discharge Summary   Patient ID: [name] MRN: [id number] DOB/AGE: Jul 27, 1980 36 y.o.  Admit date: 07/04/2016 Discharge date: 07/04/2016  Primary Care Physician:  Tresa Garter, MD  Admission Diagnoses:  Active Problems:   Chronic pain syndrome   Sickle cell anemia (HCC)   Sickle cell crisis [facility])  Discharge Medications:  Allergies as of 07/04/2016   No Known Allergies     Medication List    TAKE these medications   carvedilol 3.125 MG tablet Commonly known as:  COREG Take 1 tablet (3.125 mg total) by mouth 2 (two) times daily with a meal.   cholecalciferol 1000 units tablet Commonly known as:  VITAMIN D Take 2,000 Units by mouth daily.   folic acid 1 MG tablet Commonly known as:  FOLVITE Take 2 tablets (2 mg total) by mouth daily.   gabapentin 300 MG capsule Commonly known as:  NEURONTIN Take 1 capsule (300 mg total) by mouth 3 (three) times daily.   HYDROmorphone 4 MG tablet Commonly known as:  DILAUDID Take by mouth every 4 (four) hours as needed for severe pain.   hydroxyurea 500 MG capsule Commonly known as:  HYDREA Take 1,000 mg by mouth every other day. May take with food to minimize GI side effects.   hydroxyurea 500 MG capsule Commonly known as:  HYDREA Take 500 mg by mouth every other day. May take with food to minimize GI side effects.   L-glutamine 5 g Pack Powder Packet Commonly known as:  ENDARI Take 15 g by mouth 2 (two) times daily.   LETAIRIS 5 MG tablet Generic drug:  ambrisentan Take 5 mg by mouth at bedtime.   lisinopril 2.5 MG tablet Commonly known as:  PRINIVIL,ZESTRIL Take 2.5 mg by mouth at bedtime.   morphine 30 MG 12 hr tablet Commonly known as:  MS CONTIN Take 1 tablet (30 mg total) by mouth every 12 (twelve) hours.   potassium chloride SA 20 MEQ tablet Commonly known as:  K-DUR,KLOR-CON Take 2 tablets (40 mEq total) by mouth daily.   rivaroxaban 20 MG Tabs tablet Commonly known  as:  XARELTO Take 1 tablet (20 mg total) by mouth at bedtime.   torsemide 20 MG tablet Commonly known as:  DEMADEX Take 1 tablet (20 mg total) by mouth daily as needed (leg swelling).   zolpidem 10 MG tablet Commonly known as:  AMBIEN Take 1 tablet (10 mg total) by mouth at bedtime as needed for sleep. What changed:  when to take this        Consults: N/A  Significant Diagnostic Studies:  Dg Chest 2 View  Result Date: 07/03/2016 CLINICAL DATA:  Sickle cell anemia.  Pain crisis. EXAM: CHEST  2 VIEW COMPARISON:  06/29/2016 FINDINGS: Left Port-A-Cath remains in place, unchanged. Cardiomegaly with vascular congestion. No confluent opacities or effusions. No acute bony abnormality. IMPRESSION: Cardiomegaly with vascular congestion. Electronically Signed   By: Rolm Baptise M.D.   On: 07/03/2016 18:51   Dg Chest 2 View  Result Date: 06/29/2016 CLINICAL DATA:  Sickle cell crisis with chest pain and shortness of breath. EXAM: CHEST  2 VIEW COMPARISON:  06/26/2016; 04/29/2016; chest CT - 05/06/2016 FINDINGS: Grossly unchanged enlarged cardiac silhouette and mediastinal contours. Stable position of support apparatus. Suspected slight worsening of linear heterogeneous potential airspace opacities within the right mid lung. Peripheral and basilar heterogeneous opacities are unchanged. No pleural effusion or pneumothorax. No evidence of edema. No acute osseus abnormalities. IMPRESSION: Worsening ill-defined right  mid lung heterogeneous opacities could be indicative of early acute chest syndrome given provided history of sickle cell disease. Electronically Signed   By: Sandi Mariscal M.D.   On: 06/29/2016 12:37   [facility] Course: [name] is a 36 y.o. male with a diagnosis of Sickle Cell Anemia, type Hb-SS, presents today with a complaint of generalized pain. Dr. Di Kindle phoned from [facility] Emergency department to advise that [name] was medically stable and requested that he  be transferred to the day infusion center for further pain management.  [name] was recently admitted to inpatient services 06/25/2016 and discharged 07/02/2016. He subsequently presented back to the emergency department one day after discharge and again today. Both encounters, he was deemed medically stable. While in the ED today, he has only received one dose of Dilaudid 4 mg PO. He arrived ambulatory to the clinic and reported that he was feeling "fine" with generalized pain 8/10. Characterized pain as aching. Patient denies headache, fever, shortness of breath, chest pain, dysuria, nausea, vomiting, or diarrhea.  [name] was admitted to the day infusion center for extended observation and the following was his course of treatment:  -D51/2 IV Fluids @ 125 cc/hr for cellular rehydration. -Toradol 30 mg intravenously -Receive Dilaudid PO totally 12 mg. -Lab results reviewed. Consistent with baseline. -Patient is ambulatory, alert, and oriented.    Physical Exam at Discharge:  BP 115/84 (BP Location: Left Arm)   Pulse 75   Temp 98.9 F (37.2 C) (Oral)   Resp 16   Ht 6' (1.829 m)   Wt 197 lb (89.4 kg)   SpO2 95%   BMI 26.72 kg/m    General Appearance:    Alert, cooperative, no distress, appears stated age  Head:    Normocephalic, without obvious abnormality, atraumatic  Eyes:    PERRL, conjunctiva/corneas clear, EOM's intact  Back:     Symmetric, no curvature, ROM normal, no CVA tenderness  Lungs:     Clear to auscultation bilaterally, respirations unlabored  Chest Wall:    No tenderness or deformity   Heart:    Regular rate and rhythm, S1 and S2 normal, no murmur, rub   or gallop  Abdomen:     Soft, non-tender, bowel sounds active all four quadrants,    no masses, no organomegaly  Extremities:   Extremities normal, atraumatic, no cyanosis or edema  Pulses:   2+ and symmetric all extremities  Skin:   Skin color, texture, turgor normal, no rashes or lesions  Neurologic:   Normal  strength for patient's baseline and alteration in   speech pattern is patient's baseline       Disposition at Discharge: 01-Home or Self Care  Discharge Orders:  -Continue to hydrate and take prescribed home medications as ordered. -Resume all home medications. -Keep upcoming appointment  -Keep follow-up appointment scheduled at [facility] 07/13/2016 for Sickle Cell Mangement -The patient was given clear instructions to go to ER or return to medical center if symptoms do not improve, worsen or new problems develop. The patient verbalized understanding.  Condition at Discharge:   Stable  Time spent on Discharge:  20 minutes   Signed: Molli Barrows 07/04/2016, 6:55 PM

## 2016-07-04 NOTE — Progress Notes (Signed)
Pt received to the [facility] for treatment. Pt stated his pain is 8/10 and was treated with IV fluids, rest, IV Toradol and po Dilaudid. Pt's pain # was down to 5/10 at discharge. D/C instructions given with verbal understanding. Pt discharge home with with brother.

## 2016-07-05 ENCOUNTER — Other Ambulatory Visit: Payer: Self-pay | Admitting: Family Medicine

## 2016-07-05 ENCOUNTER — Other Ambulatory Visit: Payer: Self-pay | Admitting: Internal Medicine

## 2016-07-05 DIAGNOSIS — F5101 Primary insomnia: Secondary | ICD-10-CM

## 2016-07-05 MED ORDER — ZOLPIDEM TARTRATE 10 MG PO TABS: 10.0000 mg | ORAL_TABLET | Freq: Every day | ORAL | 0 refills | Status: DC

## 2016-07-05 NOTE — Progress Notes (Signed)
Patient ID: [name], male   DOB: 30-Nov-1980, 36 y.o.   MRN: [id number] Lat Entry from 07/04/2016 at 8:40 am   Received a call from Dr. Ashok Cordia in the ED stating that [name] presented in the ED in pain. He reported that except for pain he otherwise did not require hospitalization and that he was seeking advice regarding treatment of his Sickle Cell Crisis as he had been in the ED both day since discharge from the hospital. I advised Dr. Ashok Cordia that although patienr was having pain, the contribution from sickle cell was miniscule as his electrophoresis performed on 05/2816 demonstrated a % Hb S of 30.8% which was comparable to trait status. This was performed before the patient received a transfusion of 1 unit of RBC's to get his Hb levels closer to 9-10 g/dl. I feel that his pain is likely chronic with an exacerbation of the pain associated with lack of use of his Oxygen. My recommendation to Dr. Ashok Cordia was to refer to his PMD Dr. Doreene Burke and advise patient to adhere to Oxygen use.   Lashante Fryberger A.

## 2016-07-05 NOTE — Progress Notes (Signed)
Refilled Ambien 10 mg

## 2016-07-06 ENCOUNTER — Emergency Department (HOSPITAL_COMMUNITY)
Admission: EM | Admit: 2016-07-06 | Discharge: 2016-07-06 | Disposition: A | Discharge status: Home / Self Care | Payer: Medicare Other | Attending: Emergency Medicine | Admitting: Emergency Medicine

## 2016-07-06 ENCOUNTER — Encounter (HOSPITAL_COMMUNITY): Payer: Self-pay | Admitting: Emergency Medicine

## 2016-07-06 ENCOUNTER — Telehealth: Payer: Self-pay

## 2016-07-06 DIAGNOSIS — Z79899 Other long term (current) drug therapy: Secondary | ICD-10-CM | POA: Insufficient documentation

## 2016-07-06 DIAGNOSIS — G894 Chronic pain syndrome: Secondary | ICD-10-CM | POA: Insufficient documentation

## 2016-07-06 DIAGNOSIS — Z87891 Personal history of nicotine dependence: Secondary | ICD-10-CM | POA: Diagnosis not present

## 2016-07-06 DIAGNOSIS — E876 Hypokalemia: Secondary | ICD-10-CM | POA: Diagnosis not present

## 2016-07-06 DIAGNOSIS — I10 Essential (primary) hypertension: Secondary | ICD-10-CM | POA: Insufficient documentation

## 2016-07-06 DIAGNOSIS — D57419 Sickle-cell thalassemia with crisis, unspecified: Secondary | ICD-10-CM | POA: Diagnosis present

## 2016-07-06 LAB — RETICULOCYTES
RBC.: 2.97 MIL/uL — ABNORMAL LOW (ref 4.22–5.81)
RETIC COUNT ABSOLUTE: 204.9 10*3/uL — AB (ref 19.0–186.0)
RETIC CT PCT: 6.9 % — AB (ref 0.4–3.1)

## 2016-07-06 LAB — COMPREHENSIVE METABOLIC PANEL
ALT: 31 U/L (ref 17–63)
AST: 46 U/L — AB (ref 15–41)
Albumin: 4.4 g/dL (ref 3.5–5.0)
Alkaline Phosphatase: 104 U/L (ref 38–126)
Anion gap: 8 (ref 5–15)
BUN: 14 mg/dL (ref 6–20)
CHLORIDE: 104 mmol/L (ref 101–111)
CO2: 23 mmol/L (ref 22–32)
Calcium: 9.1 mg/dL (ref 8.9–10.3)
Creatinine, Ser: 0.75 mg/dL (ref 0.61–1.24)
Glucose, Bld: 106 mg/dL — ABNORMAL HIGH (ref 65–99)
POTASSIUM: 3.2 mmol/L — AB (ref 3.5–5.1)
Sodium: 135 mmol/L (ref 135–145)
Total Bilirubin: 6.9 mg/dL — ABNORMAL HIGH (ref 0.3–1.2)
Total Protein: 8.2 g/dL — ABNORMAL HIGH (ref 6.5–8.1)

## 2016-07-06 LAB — CBC WITH DIFFERENTIAL/PLATELET
BASOS ABS: 0.2 10*3/uL — AB (ref 0.0–0.1)
Basophils Relative: 1 %
Eosinophils Absolute: 0.2 10*3/uL (ref 0.0–0.7)
Eosinophils Relative: 1 %
HCT: 25.7 % — ABNORMAL LOW (ref 39.0–52.0)
Hemoglobin: 8.9 g/dL — ABNORMAL LOW (ref 13.0–17.0)
Lymphocytes Relative: 13 %
Lymphs Abs: 2.2 10*3/uL (ref 0.7–4.0)
MCH: 30 pg (ref 26.0–34.0)
MCHC: 34.6 g/dL (ref 30.0–36.0)
MCV: 86.5 fL (ref 78.0–100.0)
MONOS PCT: 16 %
Monocytes Absolute: 2.7 10*3/uL — ABNORMAL HIGH (ref 0.1–1.0)
NEUTROS PCT: 69 %
Neutro Abs: 11.6 10*3/uL — ABNORMAL HIGH (ref 1.7–7.7)
PLATELETS: 524 10*3/uL — AB (ref 150–400)
RBC: 2.97 MIL/uL — AB (ref 4.22–5.81)
RDW: 18.7 % — ABNORMAL HIGH (ref 11.5–15.5)
WBC: 16.9 10*3/uL — AB (ref 4.0–10.5)
nRBC: 1 /100 WBC — ABNORMAL HIGH

## 2016-07-06 IMAGING — CR DG CHEST 2V
2 series · 2 of 2 positions shown · non-contrast
Comparison: 07/29/2013

CLINICAL DATA: Sickle-cell, chest pain

EXAM:
CHEST  2 VIEW

[w chest lat]
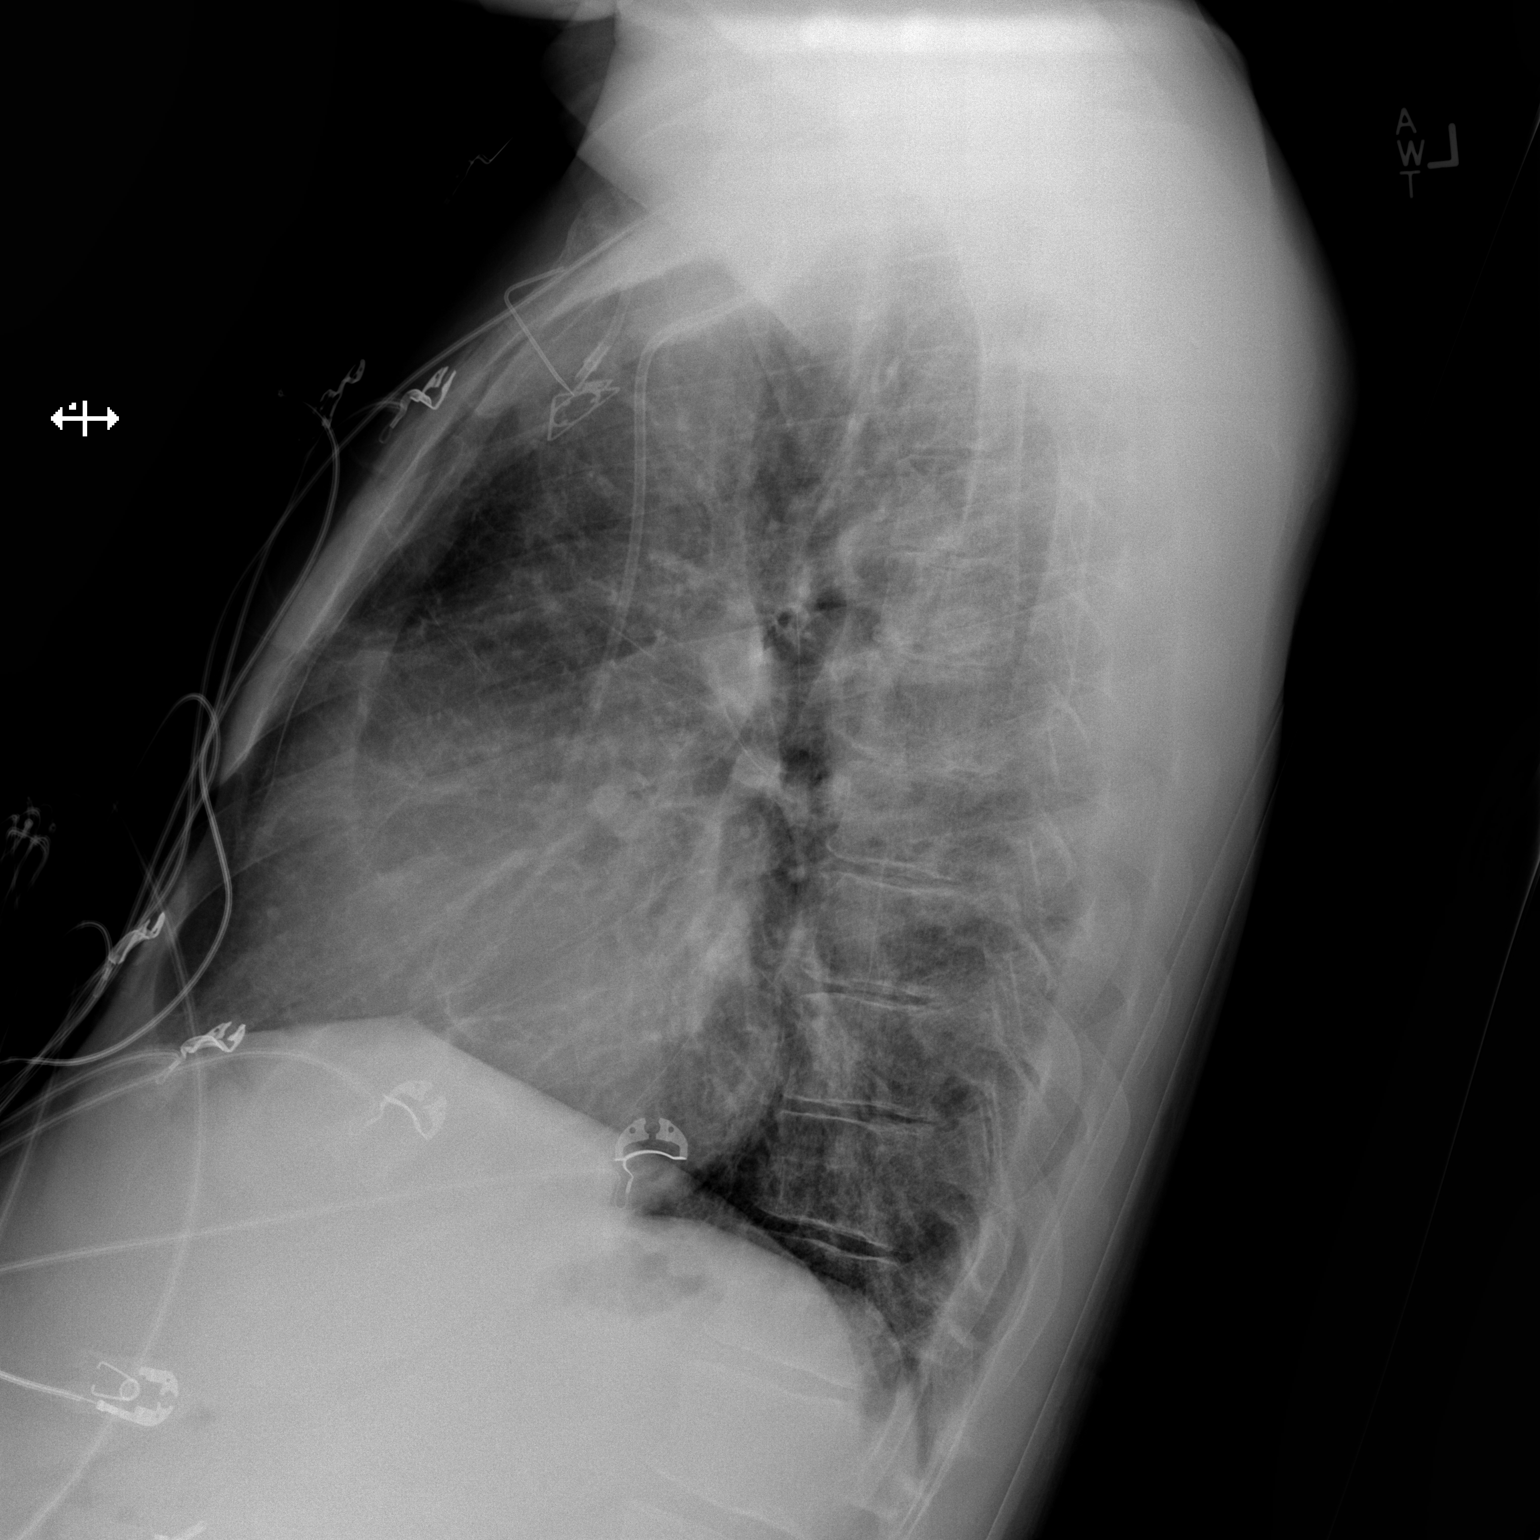

[x chest ap]
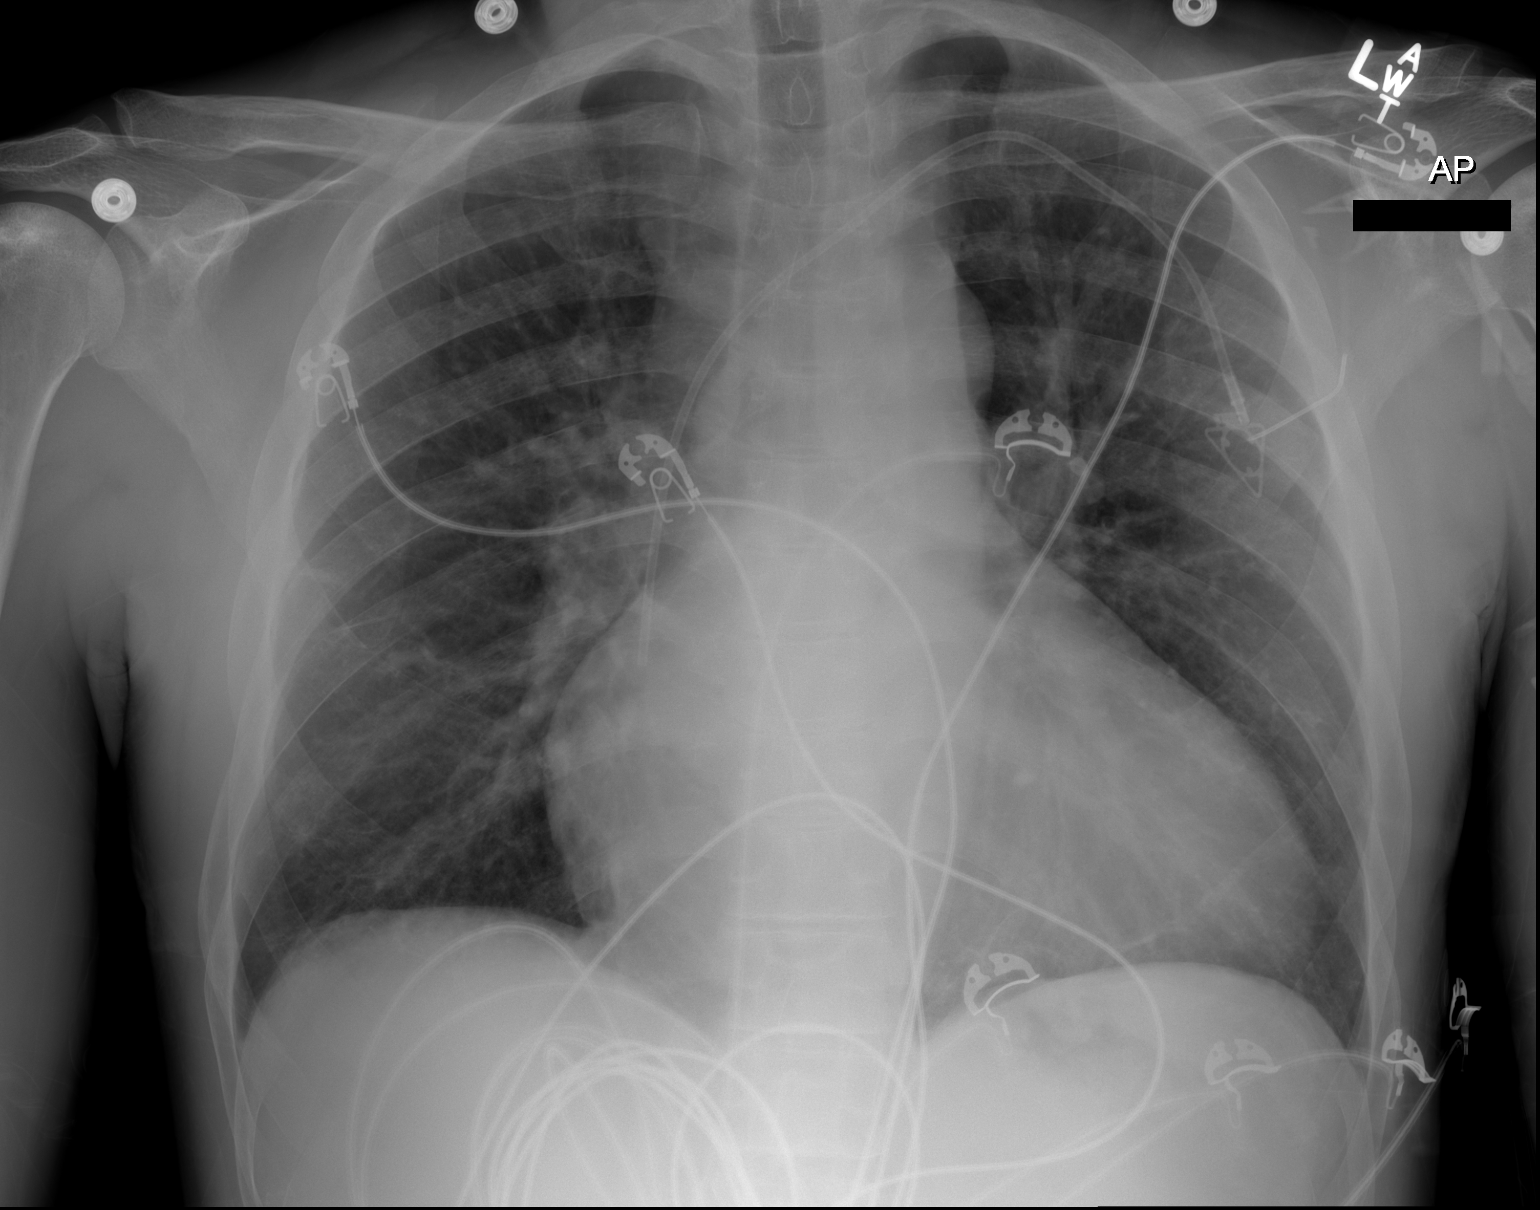

[2 of 2 positions shown; findings below may reference images not displayed]

FINDINGS: No pleural effusion or pneumothorax. No focal consolidation. Mild
interstitial thickening. Stable cardiomegaly. Left-sided Port-A-Cath
in satisfactory position. Prominence of the central pulmonary
vasculature. Unremarkable osseous structures.
IMPRESSION: No active cardiopulmonary disease.

Cardiomegaly.

## 2016-07-06 MED ORDER — DEXTROSE-NACL 5-0.45 % IV SOLN
INTRAVENOUS | Status: DC
  Administered 2016-07-06: 04:00:00 via INTRAVENOUS

## 2016-07-06 MED ORDER — MORPHINE SULFATE ER 30 MG PO TBCR
30.0000 mg | EXTENDED_RELEASE_TABLET | Freq: Two times a day (BID) | ORAL | Status: AC
  Administered 2016-07-06: 30 mg via ORAL
  Filled 2016-07-06: qty 2

## 2016-07-06 MED ORDER — HYDROMORPHONE HCL 1 MG/ML IJ SOLN
2.0000 mg | Freq: Once | INTRAMUSCULAR | Status: AC
  Administered 2016-07-06: 2 mg via INTRAVENOUS
  Filled 2016-07-06: qty 2

## 2016-07-06 MED ORDER — POTASSIUM CHLORIDE CRYS ER 20 MEQ PO TBCR
40.0000 meq | EXTENDED_RELEASE_TABLET | Freq: Once | ORAL | Status: AC
  Administered 2016-07-06: 40 meq via ORAL
  Filled 2016-07-06: qty 2

## 2016-07-06 MED ORDER — HYDROMORPHONE HCL 2 MG PO TABS
4.0000 mg | ORAL_TABLET | Freq: Once | ORAL | Status: AC
  Administered 2016-07-06: 4 mg via ORAL
  Filled 2016-07-06: qty 2

## 2016-07-06 MED ORDER — HEPARIN SOD (PORK) LOCK FLUSH 100 UNIT/ML IV SOLN
500.0000 [IU] | Freq: Once | INTRAVENOUS | Status: AC
  Administered 2016-07-06: 500 [IU]
  Filled 2016-07-06: qty 5

## 2016-07-06 MED ORDER — KETOROLAC TROMETHAMINE 30 MG/ML IJ SOLN
30.0000 mg | INTRAMUSCULAR | Status: AC
  Administered 2016-07-06: 30 mg via INTRAVENOUS
  Filled 2016-07-06: qty 1

## 2016-07-06 NOTE — Discharge Instructions (Signed)
Go to the sickle cell clinic if your pain persists. Get your prescriptions refilled on Friday as scheduled.

## 2016-07-06 NOTE — ED Provider Notes (Signed)
[facility] DEPT Provider Note   CSN: [id number] Arrival date & time: 07/06/16  0146     History   Chief Complaint Chief Complaint  Patient presents with  . Sickle Cell Pain Crisis    HPI [name] is a 36 y.o. male.  36 year old male with history of sickle cell SS anemia, chronic pain syndrome, hypertension, and avascular necrosis of the right hip presents to the emergency department for complaints of pain. He states that he has been having pain to his right side and back. He states that he has been out of his 4 mg Dilaudid tablets. He reports usually obtaining these prescriptions on a Tuesday and Friday, but forgot to go to the pharmacy today. He has not taken any other medications for his pain. No associated fevers, chest pain, shortness of breath, abdominal pain, vomiting, diarrhea. Patient has been seen 34 times in the past 6 months for similar complaints. He was discharged from the [facility] on 07/04/16 with reassuring electrophoresis results at the end of May.   The history is provided by the patient. No language interpreter was used.  Sickle Cell Pain Crisis    Past Medical History:  Diagnosis Date  . Acute chest syndrome ([place]) 06/18/2013  . Acute embolism and thrombosis of right internal jugular vein ([place])   . Alcohol consumption of one to four drinks per day   . Avascular necrosis (HCC)    Right Hip  . Blood transfusion   . Chronic anticoagulation   . Demand ischemia ([place]) 01/02/2014  . Former smoker   . Functional asplenia   . Hb-SS disease with crisis ([place])   . History of Clostridium difficile infection   . History of pulmonary embolus (PE)   . Hypertension   . Hypokalemia   . Leukocytosis    Chronic  . Mood disorder ([place])   . Noncompliance with medication regimen   . Oxygen deficiency   . Pulmonary hypertension ([place])   . Second hand tobacco smoke exposure   . Sickle cell anemia (HCC)   . Sickle-cell crisis with associated acute chest syndrome ([place])  05/13/2013  . Stroke ([place])   . Thrombocytosis (HCC)    Chronic  . Uses marijuana     Patient Active Problem List   Diagnosis Date Noted  . Sickle cell disease with crisis ([place]) 06/25/2016  . Sickle cell crisis ([place]) 02/23/2016  . H/O arterial ischemic stroke 11/11/2015  . Slurred speech 11/10/2015  . Chronic anemia   . Hb-SS disease without crisis ([place]) 10/07/2014  . Prolonged Q-T interval on ECG 09/10/2014  . Troponin level elevated 08/29/2014  . Cor pulmonale, chronic ([place]) 08/25/2014  . Sickle cell anemia ([place]) 06/25/2014  . Anemia of chronic disease 06/25/2014  . PAH (pulmonary artery hypertension) ([place]) 03/18/2014  . Chronic respiratory failure with hypoxia ([place]) 03/14/2014  . Paralytic strabismus, external ophthalmoplegia   . Chronic pain syndrome 12/12/2013  . Chronic anticoagulation 08/22/2013  . Essential hypertension 08/22/2013  . Pulmonary HTN ([place]) 06/18/2013  . Functional asplenia   . Vitamin D deficiency 02/13/2013  . Hx of pulmonary embolus 06/29/2012  . 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 Avascular necrosis [facility])     Past Surgical History:  Procedure Laterality Date  . CHOLECYSTECTOMY     01/2008  . Excision of left periauricular cyst     10/2009  . Excision of right ear lobe cyst with primary closur     11/2007  . Porta cath placement    . Porta  cath removal    . PORTACATH PLACEMENT  01/05/2012   Procedure: INSERTION PORT-A-CATH;  Surgeon: Odis Hollingshead, MD;  Location: [facility];  Service: General;  Laterality: N/A;  ultrasound guiced port a cath insertion with fluoroscopy  . Right hip replacement     08/2006  . UMBILICAL HERNIA REPAIR     01/2008       Home Medications    Prior to Admission medications   Medication Sig Start Date End Date Taking? Authorizing Provider  carvedilol (COREG) 3.125 MG tablet Take 1 tablet (3.125 mg total) by mouth 2 (two) times daily with a meal. 05/17/16  Yes [name], MD  cholecalciferol (VITAMIN D)  1000 units tablet Take 2,000 Units by mouth daily.   Yes [provider]  folic acid (FOLVITE) 1 MG tablet Take 2 tablets (2 mg total) by mouth daily. 04/25/16  Yes [name], MD  gabapentin (NEURONTIN) 300 MG capsule Take 1 capsule (300 mg total) by mouth 3 (three) times daily. 06/01/15  Yes Tresa Garter, MD  HYDROmorphone (DILAUDID) 4 MG tablet Take by mouth every 4 (four) hours as needed for severe pain.   Yes [provider]  hydroxyurea (HYDREA) 500 MG capsule Take 500 mg by mouth every other day. May take with food to minimize GI side effects. 06/25/16  Yes [provider]  hydroxyurea (HYDREA) 500 MG capsule Take 1,000 mg by mouth every other day. May take with food to minimize GI side effects. 06/24/16  Yes [provider]  L-glutamine (ENDARI) 5 g PACK Powder Packet Take 15 g by mouth 2 (two) times daily. 04/25/16  Yes [name], MD  lisinopril (PRINIVIL,ZESTRIL) 2.5 MG tablet Take 2.5 mg by mouth at bedtime.  05/22/16  Yes [provider]  morphine (MS CONTIN) 30 MG 12 hr tablet Take 1 tablet (30 mg total) by mouth every 12 (twelve) hours. 06/02/16 07/03/17 Yes Jegede, Marlena Clipper, MD  potassium chloride SA (K-DUR,KLOR-CON) 20 MEQ tablet Take 2 tablets (40 mEq total) by mouth daily. 04/27/16  Yes [name], MD  rivaroxaban (XARELTO) 20 MG TABS tablet Take 1 tablet (20 mg total) by mouth at bedtime. 06/23/16  Yes Dorena Dew, FNP  torsemide (DEMADEX) 20 MG tablet Take 1 tablet (20 mg total) by mouth daily as needed (leg swelling). 06/13/16  Yes [name], MD  zolpidem (AMBIEN) 10 MG tablet Take 1 tablet (10 mg total) by mouth at bedtime. 07/05/16 08/04/16 Yes Scot Jun, FNP  LETAIRIS 5 MG tablet Take 5 mg by mouth at bedtime.  06/15/16   [provider]    Family History Family History  Problem Relation Age of Onset  . Sickle cell trait Mother   . Depression Mother   . Diabetes Mother    . Sickle cell trait Father   . Sickle cell trait Brother     Social History Social History  Substance Use Topics  . Smoking status: Former Smoker    Packs/day: 0.50    Years: 10.00    Types: Cigarettes    Quit date: 05/29/2011  . Smokeless tobacco: Never Used  . Alcohol use No     Allergies   Patient has no known allergies.   Review of Systems Review of Systems Ten systems reviewed and are negative for acute change, except as noted in the HPI.    Physical Exam Updated Vital Signs BP 104/75 (BP Location: Left Arm)   Pulse 83  Temp 98.4 F (36.9 C) (Oral)   Resp 18   SpO2 98%   Physical Exam  Constitutional: He is oriented to person, place, and time. He appears well-developed and well-nourished. No distress.  Nontoxic appearing and in NAD  HENT:  Head: Normocephalic and atraumatic.  Eyes: Conjunctivae and EOM are normal. No scleral icterus.  Neck: Normal range of motion.  Cardiovascular: Normal rate, regular rhythm and intact distal pulses.   Pulmonary/Chest: Effort normal. No respiratory distress. He has no wheezes. He has no rales.  Chest expansion symmetric. Lungs CTAB.  Abdominal: He exhibits no distension.  Musculoskeletal: Normal range of motion.  Neurological: He is alert and oriented to person, place, and time. He exhibits normal muscle tone. Coordination normal.  Skin: Skin is warm and dry. No rash noted. He is not diaphoretic. No erythema. No pallor.  Psychiatric: He has a normal mood and affect. His behavior is normal.  Nursing note and vitals reviewed.    ED Treatments / Results  Labs (all labs ordered are listed, but only abnormal results are displayed) Labs Reviewed  COMPREHENSIVE METABOLIC PANEL - Abnormal; Notable for the following:       Result Value   Potassium 3.2 (*)    Glucose, Bld 106 (*)    Total Protein 8.2 (*)    AST 46 (*)    Total Bilirubin 6.9 (*)    All other components within normal limits  CBC WITH DIFFERENTIAL/PLATELET  - Abnormal; Notable for the following:    WBC 16.9 (*)    RBC 2.97 (*)    Hemoglobin 8.9 (*)    HCT 25.7 (*)    RDW 18.7 (*)    Platelets 524 (*)    nRBC 1 (*)    Neutro Abs 11.6 (*)    Monocytes Absolute 2.7 (*)    Basophils Absolute 0.2 (*)    All other components within normal limits  RETICULOCYTES - Abnormal; Notable for the following:    Retic Ct Pct 6.9 (*)    RBC. 2.97 (*)    Retic Count, Manual 204.9 (*)    All other components within normal limits    EKG  EKG Interpretation None       Radiology No results found.  Procedures Procedures (including critical care time)  Medications Ordered in ED Medications  dextrose 5 %-0.45 % sodium chloride infusion ( Intravenous New Bag/Given 07/06/16 0337)  potassium chloride SA (K-DUR,KLOR-CON) CR tablet 40 mEq (not administered)  ketorolac (TORADOL) 30 MG/ML injection 30 mg (30 mg Intravenous Given 07/06/16 0338)  HYDROmorphone (DILAUDID) injection 2 mg (2 mg Intravenous Given 07/06/16 0338)  HYDROmorphone (DILAUDID) tablet 4 mg (4 mg Oral Given 07/06/16 0337)  morphine (MS CONTIN) 12 hr tablet 30 mg (30 mg Oral Given 07/06/16 0514)  HYDROmorphone (DILAUDID) injection 2 mg (2 mg Intravenous Given 07/06/16 0610)     Initial Impression / Assessment and Plan / ED Course  I have reviewed the triage vital signs and the nursing notes.  Pertinent labs & imaging results that were available during my care of the patient were reviewed by me and considered in my medical decision making (see chart for details).     36 year old male, well-known to the emergency department, presents to the emergency department today for abdominal pain and flank pain which she attributes to his sickle cell. He is most recently seen 2 days ago for similar symptoms.  Per Dr. West Pugh note on 07/04/16: Pain thought less likely to be due to sickle  cell as electrophoresis performed on 06/27/16 demonstrated a % Hb S of 30.8% which was comparable to trait status.  Laboratory work up is at baseline today.  Patient given his home doses of Dilaudid and MS Contin. This was supplemented with IV Dilaudid x 2 and Toradol. Patient has been hemodynamically stable since arrival. He is in no visible or audible signs of discomfort. Mr. Rivenburg has been advised to go to the [facility] today if pain continues. Patient verbalizes comfort and understanding with plan. Return precautions given. Patient discharged in satisfactory condition.   Final Clinical Impressions(s) / ED Diagnoses   Final diagnoses:  Chronic pain syndrome    New Prescriptions New Prescriptions   No medications on file     Antonietta Breach, Hershal Coria 07/06/16 [id number]    Ripley Fraise, MD 07/06/16 [phone]

## 2016-07-06 NOTE — ED Triage Notes (Signed)
Pt states he is having his normal sickle cell pain in his sides and in his back  States it woke him up about an hour ago

## 2016-07-07 ENCOUNTER — Other Ambulatory Visit: Payer: Self-pay | Admitting: Internal Medicine

## 2016-07-07 ENCOUNTER — Emergency Department (HOSPITAL_COMMUNITY)
Admission: EM | Admit: 2016-07-07 | Discharge: 2016-07-08 | Disposition: A | Discharge status: Home / Self Care | Payer: Medicare Other | Attending: Emergency Medicine | Admitting: Emergency Medicine

## 2016-07-07 ENCOUNTER — Telehealth (HOSPITAL_COMMUNITY): Payer: Self-pay | Admitting: *Deleted

## 2016-07-07 ENCOUNTER — Encounter (HOSPITAL_COMMUNITY): Payer: Self-pay | Admitting: Emergency Medicine

## 2016-07-07 DIAGNOSIS — Z87891 Personal history of nicotine dependence: Secondary | ICD-10-CM | POA: Insufficient documentation

## 2016-07-07 DIAGNOSIS — M791 Myalgia: Secondary | ICD-10-CM | POA: Diagnosis not present

## 2016-07-07 DIAGNOSIS — Z8673 Personal history of transient ischemic attack (TIA), and cerebral infarction without residual deficits: Secondary | ICD-10-CM | POA: Diagnosis not present

## 2016-07-07 DIAGNOSIS — Z79899 Other long term (current) drug therapy: Secondary | ICD-10-CM | POA: Insufficient documentation

## 2016-07-07 DIAGNOSIS — M545 Low back pain: Secondary | ICD-10-CM | POA: Diagnosis not present

## 2016-07-07 DIAGNOSIS — D5700 Hb-SS disease with crisis, unspecified: Secondary | ICD-10-CM | POA: Diagnosis not present

## 2016-07-07 DIAGNOSIS — R0781 Pleurodynia: Secondary | ICD-10-CM | POA: Diagnosis not present

## 2016-07-07 DIAGNOSIS — M79604 Pain in right leg: Secondary | ICD-10-CM | POA: Diagnosis not present

## 2016-07-07 DIAGNOSIS — M79605 Pain in left leg: Secondary | ICD-10-CM | POA: Diagnosis not present

## 2016-07-07 DIAGNOSIS — I10 Essential (primary) hypertension: Secondary | ICD-10-CM | POA: Insufficient documentation

## 2016-07-07 DIAGNOSIS — M79601 Pain in right arm: Secondary | ICD-10-CM | POA: Diagnosis not present

## 2016-07-07 DIAGNOSIS — D571 Sickle-cell disease without crisis: Secondary | ICD-10-CM | POA: Diagnosis not present

## 2016-07-07 DIAGNOSIS — I517 Cardiomegaly: Secondary | ICD-10-CM | POA: Diagnosis not present

## 2016-07-07 DIAGNOSIS — G8929 Other chronic pain: Secondary | ICD-10-CM | POA: Diagnosis not present

## 2016-07-07 DIAGNOSIS — M79602 Pain in left arm: Secondary | ICD-10-CM | POA: Diagnosis not present

## 2016-07-07 LAB — CBC WITH DIFFERENTIAL/PLATELET
BASOS ABS: 0.1 10*3/uL (ref 0.0–0.1)
Basophils Relative: 0 %
EOS ABS: 0.2 10*3/uL (ref 0.0–0.7)
Eosinophils Relative: 1 %
HCT: 25.6 % — ABNORMAL LOW (ref 39.0–52.0)
HEMOGLOBIN: 8.8 g/dL — AB (ref 13.0–17.0)
LYMPHS ABS: 1.8 10*3/uL (ref 0.7–4.0)
Lymphocytes Relative: 10 %
MCH: 30.6 pg (ref 26.0–34.0)
MCHC: 34.4 g/dL (ref 30.0–36.0)
MCV: 88.9 fL (ref 78.0–100.0)
Monocytes Absolute: 2.5 10*3/uL — ABNORMAL HIGH (ref 0.1–1.0)
Monocytes Relative: 15 %
NEUTROS PCT: 74 %
Neutro Abs: 12.4 10*3/uL — ABNORMAL HIGH (ref 1.7–7.7)
Platelets: 545 10*3/uL — ABNORMAL HIGH (ref 150–400)
RBC: 2.88 MIL/uL — AB (ref 4.22–5.81)
RDW: 19.6 % — ABNORMAL HIGH (ref 11.5–15.5)
WBC: 16.9 10*3/uL — AB (ref 4.0–10.5)

## 2016-07-07 LAB — COMPREHENSIVE METABOLIC PANEL
ALT: 26 U/L (ref 17–63)
AST: 32 U/L (ref 15–41)
Albumin: 4.3 g/dL (ref 3.5–5.0)
Alkaline Phosphatase: 92 U/L (ref 38–126)
Anion gap: 7 (ref 5–15)
BUN: 11 mg/dL (ref 6–20)
CHLORIDE: 109 mmol/L (ref 101–111)
CO2: 23 mmol/L (ref 22–32)
CREATININE: 0.78 mg/dL (ref 0.61–1.24)
Calcium: 8.6 mg/dL — ABNORMAL LOW (ref 8.9–10.3)
Glucose, Bld: 106 mg/dL — ABNORMAL HIGH (ref 65–99)
POTASSIUM: 3.8 mmol/L (ref 3.5–5.1)
Sodium: 139 mmol/L (ref 135–145)
Total Bilirubin: 5.8 mg/dL — ABNORMAL HIGH (ref 0.3–1.2)
Total Protein: 7.9 g/dL (ref 6.5–8.1)

## 2016-07-07 LAB — RETICULOCYTES
RBC.: 2.88 MIL/uL — AB (ref 4.22–5.81)
RETIC COUNT ABSOLUTE: 311 10*3/uL — AB (ref 19.0–186.0)
RETIC CT PCT: 10.8 % — AB (ref 0.4–3.1)

## 2016-07-07 MED ORDER — HYDROMORPHONE HCL 1 MG/ML IJ SOLN
2.0000 mg | INTRAMUSCULAR | Status: AC
  Administered 2016-07-08: 2 mg via INTRAVENOUS
  Filled 2016-07-07: qty 2

## 2016-07-07 MED ORDER — HYDROMORPHONE HCL 1 MG/ML IJ SOLN: 2.0000 mg | INTRAMUSCULAR | Status: AC

## 2016-07-07 MED ORDER — HYDROMORPHONE HCL 4 MG PO TABS: 4.0000 mg | ORAL_TABLET | ORAL | 0 refills | Status: DC | PRN

## 2016-07-07 MED ORDER — HYDROMORPHONE HCL 1 MG/ML IJ SOLN: 2.0000 mg | INTRAMUSCULAR | Status: DC

## 2016-07-07 MED ORDER — HYDROMORPHONE HCL 1 MG/ML IJ SOLN
2.0000 mg | INTRAMUSCULAR | Status: DC
  Administered 2016-07-08: 2 mg via INTRAVENOUS
  Filled 2016-07-07: qty 2

## 2016-07-07 MED ORDER — KETOROLAC TROMETHAMINE 30 MG/ML IJ SOLN
30.0000 mg | INTRAMUSCULAR | Status: AC
  Administered 2016-07-08: 30 mg via INTRAVENOUS
  Filled 2016-07-07: qty 1

## 2016-07-07 MED ORDER — DEXTROSE-NACL 5-0.45 % IV SOLN
INTRAVENOUS | Status: DC
  Administered 2016-07-08: via INTRAVENOUS

## 2016-07-07 NOTE — ED Triage Notes (Signed)
Patient complaining of sickle cell pain crisis. Patient states this started two days ago. Patient states his medication is not working.

## 2016-07-07 NOTE — ED Notes (Signed)
Patient wants to wait until he gets to the back to collect labs

## 2016-07-07 NOTE — Telephone Encounter (Signed)
Patient called requesting to come to the [facility] for treatment. Pt states he is having pain in his legs, thighs and rib cage. He states his pain is 9/140. He took his last Dilaudid 4mg  last night at 10pm and his Gabapentin at 6am. He denies fever, chest pain, nausea vomiting, diarrhea, abd pain or priapism. States he is out of his MS contin. Will check with the provider while placing him on hold. Pt voiced understanding.  After speaking with the provider,pt was told to take his meds as prescribed (Dilaudid), rest and hydrate. Pt voiced understanding.

## 2016-07-07 NOTE — ED Provider Notes (Signed)
[facility] DEPT Provider Note   CSN: [id number] Arrival date & time: 07/07/16  2105   By signing my name below, I, [name], attest that this documentation has been prepared under the direction and in the presence of Gerald Fuel, MD. Electronically signed, [name], ED Scribe. 07/07/16. 11:23 PM.   History   Chief Complaint Chief Complaint  Patient presents with  . Sickle Cell Pain Crisis   The history is provided by the patient and medical records. No language interpreter was used.    [name] is a 36 y.o. male with h/o sickle cell anemia presenting to the Emergency Department with chief complaint of gradually worsening, acute on chronic generalized pains x 2 days. He reports pains to the ribs and legs. He describes 8/10, constant pains. He states he has taken prescribed pain medications including dilaudid at home without relief at home. Pt seen for similar symptoms yesterday in [facility] ED where he was given medications after labs were obtained. Pt discharged at this time with referral to [facility] for F/U; he states he has contacted them. Pt also recently discharged following admission for sickle cell pain crisis on 2 separate occasions within the past 2 weeks. No fever, chills, sweats, cough or N/V. No other complaints at this time.   Past Medical History:  Diagnosis Date  . Acute chest syndrome ([place]) 06/18/2013  . Acute embolism and thrombosis of right internal jugular vein ([place])   . Alcohol consumption of one to four drinks per day   . Avascular necrosis (HCC)    Right Hip  . Blood transfusion   . Chronic anticoagulation   . Demand ischemia ([place]) 01/02/2014  . Former smoker   . Functional asplenia   . Hb-SS disease with crisis ([place])   . History of Clostridium difficile infection   . History of pulmonary embolus (PE)   . Hypertension   . Hypokalemia   . Leukocytosis    Chronic  . Mood disorder ([place])   . Noncompliance with medication  regimen   . Oxygen deficiency   . Pulmonary hypertension ([place])   . Second hand tobacco smoke exposure   . Sickle cell anemia (HCC)   . Sickle-cell crisis with associated acute chest syndrome ([place]) 05/13/2013  . Stroke ([place])   . Thrombocytosis (HCC)    Chronic  . Uses marijuana     Patient Active Problem List   Diagnosis Date Noted  . Sickle cell disease with crisis ([place]) 06/25/2016  . Sickle cell crisis ([place]) 02/23/2016  . H/O arterial ischemic stroke 11/11/2015  . Slurred speech 11/10/2015  . Chronic anemia   . Hb-SS disease without crisis ([place]) 10/07/2014  . Prolonged Q-T interval on ECG 09/10/2014  . Troponin level elevated 08/29/2014  . Cor pulmonale, chronic ([place]) 08/25/2014  . Sickle cell anemia ([place]) 06/25/2014  . Anemia of chronic disease 06/25/2014  . PAH (pulmonary artery hypertension) ([place]) 03/18/2014  . Chronic respiratory failure with hypoxia ([place]) 03/14/2014  . Paralytic strabismus, external ophthalmoplegia   . Chronic pain syndrome 12/12/2013  . Chronic anticoagulation 08/22/2013  . Essential hypertension 08/22/2013  . Pulmonary HTN ([place]) 06/18/2013  . Functional asplenia   . Vitamin D deficiency 02/13/2013  . Hx of pulmonary embolus 06/29/2012  . 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 Avascular necrosis [facility])     Past Surgical History:  Procedure Laterality Date  . CHOLECYSTECTOMY     01/2008  . Excision of left periauricular cyst  10/2009  . Excision of right ear lobe cyst with primary closur     11/2007  . Porta cath placement    . Porta cath removal    . PORTACATH PLACEMENT  01/05/2012   Procedure: INSERTION PORT-A-CATH;  Surgeon: Odis Hollingshead, MD;  Location: [facility];  Service: General;  Laterality: N/A;  ultrasound guiced port a cath insertion with fluoroscopy  . Right hip replacement     08/2006  . UMBILICAL HERNIA REPAIR     01/2008       Home Medications    Prior to Admission medications   Medication Sig Start Date End Date  Taking? Authorizing Provider  carvedilol (COREG) 3.125 MG tablet Take 1 tablet (3.125 mg total) by mouth 2 (two) times daily with a meal. 05/17/16  Yes [name], MD  cholecalciferol (VITAMIN D) 1000 units tablet Take 2,000 Units by mouth daily.   Yes [provider]  folic acid (FOLVITE) 1 MG tablet Take 2 tablets (2 mg total) by mouth daily. 04/25/16  Yes [name], MD  gabapentin (NEURONTIN) 300 MG capsule Take 1 capsule (300 mg total) by mouth 3 (three) times daily. 06/01/15  Yes Tresa Garter, MD  HYDROmorphone (DILAUDID) 4 MG tablet Take 1 tablet (4 mg total) by mouth every 4 (four) hours as needed for moderate pain or severe pain. 07/07/16 07/22/16 Yes Jegede, Marlena Clipper, MD  hydroxyurea (HYDREA) 500 MG capsule Take 500 mg by mouth every other day. May take with food to minimize GI side effects. 06/25/16  Yes [provider]  hydroxyurea (HYDREA) 500 MG capsule Take 1,000 mg by mouth every other day. May take with food to minimize GI side effects. 06/24/16  Yes [provider]  L-glutamine (ENDARI) 5 g PACK Powder Packet Take 15 g by mouth 2 (two) times daily. 04/25/16  Yes [name], MD  lisinopril (PRINIVIL,ZESTRIL) 2.5 MG tablet Take 2.5 mg by mouth at bedtime.  05/22/16  Yes [provider]  morphine (MS CONTIN) 30 MG 12 hr tablet Take 1 tablet (30 mg total) by mouth every 12 (twelve) hours. 06/02/16 07/03/17 Yes Jegede, Marlena Clipper, MD  potassium chloride SA (K-DUR,KLOR-CON) 20 MEQ tablet Take 2 tablets (40 mEq total) by mouth daily. 04/27/16  Yes [name], MD  rivaroxaban (XARELTO) 20 MG TABS tablet Take 1 tablet (20 mg total) by mouth at bedtime. 06/23/16  Yes Dorena Dew, FNP  torsemide (DEMADEX) 20 MG tablet Take 1 tablet (20 mg total) by mouth daily as needed (leg swelling). 06/13/16  Yes [name], MD  zolpidem (AMBIEN) 10 MG tablet Take 1 tablet (10 mg total) by mouth at bedtime. 07/05/16 08/04/16  Yes Scot Jun, FNP  LETAIRIS 5 MG tablet Take 5 mg by mouth at bedtime.  06/15/16   [provider]    Family History Family History  Problem Relation Age of Onset  . Sickle cell trait Mother   . Depression Mother   . Diabetes Mother   . Sickle cell trait Father   . Sickle cell trait Brother     Social History Social History  Substance Use Topics  . Smoking status: Former Smoker    Packs/day: 0.50    Years: 10.00    Types: Cigarettes    Quit date: 05/29/2011  . Smokeless tobacco: Never Used  . Alcohol use No     Allergies   Patient has no known allergies.   Review of Systems Review  of Systems  Constitutional: Negative for chills and fever.  Gastrointestinal: Negative for nausea and vomiting.  Musculoskeletal: Positive for arthralgias and myalgias.  Skin: Negative for wound.  All other systems reviewed and are negative.    Physical Exam Updated Vital Signs BP 105/81 (BP Location: Right Arm)   Pulse 88   Temp 98.9 F (37.2 C) (Oral)   Resp 18   Ht 6' (1.829 m)   Wt 195 lb (88.5 kg)   SpO2 97%   BMI 26.45 kg/m   Physical Exam  Constitutional: He is oriented to person, place, and time. He appears well-developed and well-nourished.  HENT:  Head: Normocephalic and atraumatic.  Eyes: EOM are normal. Pupils are equal, round, and reactive to light. Scleral icterus is present.  Moderate scleral icterus.  Neck: Normal range of motion. Neck supple. No JVD present.  Cardiovascular: Normal rate, regular rhythm and normal heart sounds.   No murmur heard. Pulmonary/Chest: Effort normal and breath sounds normal. He has no wheezes. He has no rales. He exhibits no tenderness.  mediport present on L side of the chest.  Abdominal: Soft. Bowel sounds are normal. He exhibits no distension and no mass. There is no tenderness.  Musculoskeletal: Normal range of motion. He exhibits no edema.  Moderate bilateral lower rib cage tenderness.  Lymphadenopathy:     He has no cervical adenopathy.  Neurological: He is alert and oriented to person, place, and time. No cranial nerve deficit. He exhibits normal muscle tone. Coordination normal.  Skin: Skin is warm and dry. No rash noted.  Psychiatric: He has a normal mood and affect. His behavior is normal. Judgment and thought content normal.  Nursing note and vitals reviewed.    ED Treatments / Results  DIAGNOSTIC STUDIES: Oxygen Saturation is 97% on RA, NL by my interpretation.    COORDINATION OF CARE: 11:20 PM-Discussed next steps with pt. Pt verbalized understanding and is agreeable with the plan. Will order medications.   Labs (all labs ordered are listed, but only abnormal results are displayed) Labs Reviewed  COMPREHENSIVE METABOLIC PANEL - Abnormal; Notable for the following:       Result Value   Glucose, Bld 106 (*)    Calcium 8.6 (*)    Total Bilirubin 5.8 (*)    All other components within normal limits  CBC WITH DIFFERENTIAL/PLATELET - Abnormal; Notable for the following:    WBC 16.9 (*)    RBC 2.88 (*)    Hemoglobin 8.8 (*)    HCT 25.6 (*)    RDW 19.6 (*)    Platelets 545 (*)    Neutro Abs 12.4 (*)    Monocytes Absolute 2.5 (*)    All other components within normal limits  RETICULOCYTES - Abnormal; Notable for the following:    Retic Ct Pct 10.8 (*)    RBC. 2.88 (*)    Retic Count, Manual 311.0 (*)    All other components within normal limits  TROPONIN I    EKG Sinus rhythm 70 bpm. Left axis deviation. Old inferior wall myocardial infarction. T-wave inversion in lateral leads, nonspecific T-wave changes anterior leads. Nonspecific intraventricular conduction delay. When compared with ECG of 06/25/2016, there are new T-wave inversions in the lateral leads, prior T-wave inversion in inferior leads has resolved, I are T-wave inversion anteriorly the has improved.  Procedures Procedures (including critical care time)  Medications Ordered in ED Medications  dextrose 5  %-0.45 % sodium chloride infusion ( Intravenous New Bag/Given 07/08/16 0009)  HYDROmorphone (  DILAUDID) injection 2 mg (2 mg Intravenous Given 07/08/16 0044)    Or  HYDROmorphone (DILAUDID) injection 2 mg ( Subcutaneous See Alternative 07/08/16 0044)  HYDROmorphone (DILAUDID) injection 2 mg (2 mg Intravenous Given 07/08/16 0217)    Or  HYDROmorphone (DILAUDID) injection 2 mg ( Subcutaneous See Alternative 07/08/16 0217)  ketorolac (TORADOL) 30 MG/ML injection 30 mg (30 mg Intravenous Given 07/08/16 0002)  HYDROmorphone (DILAUDID) injection 2 mg (2 mg Intravenous Given 07/08/16 0002)    Or  HYDROmorphone (DILAUDID) injection 2 mg ( Subcutaneous See Alternative 07/08/16 0002)  HYDROmorphone (DILAUDID) injection 2 mg (2 mg Intravenous Given 07/08/16 0109)    Or  HYDROmorphone (DILAUDID) injection 2 mg ( Subcutaneous See Alternative 07/08/16 0109)  magnesium sulfate IVPB 2 g 50 mL (0 g Intravenous Stopped 07/08/16 0214)  diphenhydrAMINE (BENADRYL) injection 25 mg (25 mg Intravenous Given 07/08/16 0044)  ondansetron (ZOFRAN) injection 4 mg (4 mg Intravenous Given 07/08/16 0050)     Initial Impression / Assessment and Plan / ED Course  I have reviewed the triage vital signs and the nursing notes.  Pertinent lab results that were available during my care of the patient were reviewed by me and considered in my medical decision making (see chart for details).  Sickle cell pain crisis. Old records are reviewed, and he has multiple ED visits and admissions for sickle cell disease. Curiously, recent hemoglobin electrophoresis showed only 30% hemoglobin S which is suggestive of sickle cell trait. However, he runs hemoglobins of 8-9 which is more consistent with sickle cell disease. Question sickle thalassemia. He is given IV ketorolac and 3 doses of hydromorphone with good relief of pain. He is discharged with instructions to follow-up in sickle cell clinic.  Of note, he apparently has a history of prolonged QT interval. ECG was  checked prior to administering metoclopramide and diphenhydramine and QT interval was not prolonged. He does have some T-wave changes compared with recent ECG, so troponin was checked and was normal. Nothing to suggest ACS on history or laboratory testing.  Final Clinical Impressions(s) / ED Diagnoses   Final diagnoses:  Sickle cell pain crisis [facility])    New Prescriptions New Prescriptions   No medications on file   I personally performed the services described in this documentation, which was scribed in my presence. The recorded information has been reviewed and is accurate.       Gerald Fuel, MD 37/85/88 [phone]

## 2016-07-08 ENCOUNTER — Telehealth (HOSPITAL_COMMUNITY): Payer: Self-pay | Admitting: Hematology

## 2016-07-08 ENCOUNTER — Emergency Department (HOSPITAL_COMMUNITY)
Admission: EM | Admit: 2016-07-08 | Discharge: 2016-07-08 | Disposition: A | Discharge status: Home / Self Care | Payer: Medicare Other | Source: Home / Self Care | Attending: Emergency Medicine | Admitting: Emergency Medicine

## 2016-07-08 ENCOUNTER — Encounter (HOSPITAL_COMMUNITY): Payer: Self-pay

## 2016-07-08 ENCOUNTER — Other Ambulatory Visit: Payer: Self-pay | Admitting: Family Medicine

## 2016-07-08 DIAGNOSIS — Z79899 Other long term (current) drug therapy: Secondary | ICD-10-CM | POA: Insufficient documentation

## 2016-07-08 DIAGNOSIS — I10 Essential (primary) hypertension: Secondary | ICD-10-CM | POA: Insufficient documentation

## 2016-07-08 DIAGNOSIS — Z87891 Personal history of nicotine dependence: Secondary | ICD-10-CM | POA: Insufficient documentation

## 2016-07-08 DIAGNOSIS — G8929 Other chronic pain: Secondary | ICD-10-CM

## 2016-07-08 DIAGNOSIS — D5700 Hb-SS disease with crisis, unspecified: Secondary | ICD-10-CM | POA: Diagnosis not present

## 2016-07-08 LAB — TROPONIN I: Troponin I: <0.03 ng/mL (ref <0.03)

## 2016-07-08 MED ORDER — HEPARIN SOD (PORK) LOCK FLUSH 100 UNIT/ML IV SOLN
INTRAVENOUS | Status: AC
  Administered 2016-07-08: 500 [IU]
  Filled 2016-07-08: qty 5

## 2016-07-08 MED ORDER — HEPARIN SOD (PORK) LOCK FLUSH 100 UNIT/ML IV SOLN
500.0000 [IU] | Freq: Once | INTRAVENOUS | Status: AC
  Administered 2016-07-08: 500 [IU]
  Filled 2016-07-08: qty 5

## 2016-07-08 MED ORDER — ONDANSETRON HCL 4 MG/2ML IJ SOLN
4.0000 mg | Freq: Once | INTRAMUSCULAR | Status: AC
  Administered 2016-07-08: 4 mg via INTRAVENOUS
  Filled 2016-07-08: qty 2

## 2016-07-08 MED ORDER — MORPHINE SULFATE ER 30 MG PO TBCR: 30.0000 mg | EXTENDED_RELEASE_TABLET | Freq: Two times a day (BID) | ORAL | 0 refills | Status: DC

## 2016-07-08 MED ORDER — DIPHENHYDRAMINE HCL 50 MG/ML IJ SOLN
25.0000 mg | Freq: Once | INTRAMUSCULAR | Status: AC
  Administered 2016-07-08: 25 mg via INTRAVENOUS
  Filled 2016-07-08: qty 1

## 2016-07-08 MED ORDER — MAGNESIUM SULFATE 2 GM/50ML IV SOLN
2.0000 g | Freq: Once | INTRAVENOUS | Status: AC
  Administered 2016-07-08: 2 g via INTRAVENOUS
  Filled 2016-07-08: qty 50

## 2016-07-08 NOTE — ED Provider Notes (Signed)
[facility] DEPT Provider Note   CSN: [id number] Arrival date & time: 07/03/16  1556     History   Chief Complaint Chief Complaint  Patient presents with  . Sickle Cell Pain Crisis    HPI [name] is a 36 y.o. male.  HPI Pt comes in with chest pain and leg pain. Pt has hx of sickle cell anemia. Pt denies any cough, dib. Pt is noted to have slightly low O2 sats - but it seems like he has had that in the past. Pt reports that the pain is typical of his sickle cell. He has hx of PE and ACS . Pt is taking his meds as prescribed.  Past Medical History:  Diagnosis Date  . Acute chest syndrome ([place]) 06/18/2013  . Acute embolism and thrombosis of right internal jugular vein ([place])   . Alcohol consumption of one to four drinks per day   . Avascular necrosis (HCC)    Right Hip  . Blood transfusion   . Chronic anticoagulation   . Demand ischemia ([place]) 01/02/2014  . Former smoker   . Functional asplenia   . Hb-SS disease with crisis ([place])   . History of Clostridium difficile infection   . History of pulmonary embolus (PE)   . Hypertension   . Hypokalemia   . Leukocytosis    Chronic  . Mood disorder ([place])   . Noncompliance with medication regimen   . Oxygen deficiency   . Pulmonary hypertension ([place])   . Second hand tobacco smoke exposure   . Sickle cell anemia (HCC)   . Sickle-cell crisis with associated acute chest syndrome ([place]) 05/13/2013  . Stroke ([place])   . Thrombocytosis (HCC)    Chronic  . Uses marijuana     Patient Active Problem List   Diagnosis Date Noted  . Sickle cell disease with crisis ([place]) 06/25/2016  . Sickle cell crisis ([place]) 02/23/2016  . H/O arterial ischemic stroke 11/11/2015  . Slurred speech 11/10/2015  . Chronic anemia   . Hb-SS disease without crisis ([place]) 10/07/2014  . Prolonged Q-T interval on ECG 09/10/2014  . Troponin level elevated 08/29/2014  . Cor pulmonale, chronic ([place]) 08/25/2014  . Sickle cell anemia ([place]) 06/25/2014  .  Anemia of chronic disease 06/25/2014  . PAH (pulmonary artery hypertension) ([place]) 03/18/2014  . Chronic respiratory failure with hypoxia ([place]) 03/14/2014  . Paralytic strabismus, external ophthalmoplegia   . Chronic pain syndrome 12/12/2013  . Chronic anticoagulation 08/22/2013  . Essential hypertension 08/22/2013  . Pulmonary HTN ([place]) 06/18/2013  . Functional asplenia   . Vitamin D deficiency 02/13/2013  . Hx of pulmonary embolus 06/29/2012  . 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 Avascular necrosis [facility])     Past Surgical History:  Procedure Laterality Date  . CHOLECYSTECTOMY     01/2008  . Excision of left periauricular cyst     10/2009  . Excision of right ear lobe cyst with primary closur     11/2007  . Porta cath placement    . Porta cath removal    . PORTACATH PLACEMENT  01/05/2012   Procedure: INSERTION PORT-A-CATH;  Surgeon: Odis Hollingshead, MD;  Location: [facility];  Service: General;  Laterality: N/A;  ultrasound guiced port a cath insertion with fluoroscopy  . Right hip replacement     08/2006  . UMBILICAL HERNIA REPAIR     01/2008       Home Medications    Prior to Admission medications   Medication Sig Start  Date End Date Taking? Authorizing Provider  carvedilol (COREG) 3.125 MG tablet Take 1 tablet (3.125 mg total) by mouth 2 (two) times daily with a meal. 05/17/16  Yes [name], MD  cholecalciferol (VITAMIN D) 1000 units tablet Take 2,000 Units by mouth daily.   Yes [provider]  folic acid (FOLVITE) 1 MG tablet Take 2 tablets (2 mg total) by mouth daily. 04/25/16  Yes [name], MD  gabapentin (NEURONTIN) 300 MG capsule Take 1 capsule (300 mg total) by mouth 3 (three) times daily. 06/01/15  Yes Jegede, Marlena Clipper, MD  hydroxyurea (HYDREA) 500 MG capsule Take 500 mg by mouth every other day. May take with food to minimize GI side effects. 06/25/16  Yes [provider]  hydroxyurea (HYDREA) 500 MG capsule Take 1,000 mg  by mouth every other day. May take with food to minimize GI side effects. 06/24/16  Yes [provider]  L-glutamine (ENDARI) 5 g PACK Powder Packet Take 15 g by mouth 2 (two) times daily. 04/25/16  Yes [name], MD  lisinopril (PRINIVIL,ZESTRIL) 2.5 MG tablet Take 2.5 mg by mouth at bedtime.  05/22/16  Yes [provider]  potassium chloride SA (K-DUR,KLOR-CON) 20 MEQ tablet Take 2 tablets (40 mEq total) by mouth daily. 04/27/16  Yes [name], MD  rivaroxaban (XARELTO) 20 MG TABS tablet Take 1 tablet (20 mg total) by mouth at bedtime. 06/23/16  Yes Dorena Dew, FNP  torsemide (DEMADEX) 20 MG tablet Take 1 tablet (20 mg total) by mouth daily as needed (leg swelling). 06/13/16  Yes [name], MD  HYDROmorphone (DILAUDID) 4 MG tablet Take 1 tablet (4 mg total) by mouth every 4 (four) hours as needed for moderate pain or severe pain. 07/07/16 07/22/16  Tresa Garter, MD  morphine (MS CONTIN) 30 MG 12 hr tablet Take 1 tablet (30 mg total) by mouth every 12 (twelve) hours. 07/09/16 08/08/16  Scot Jun, FNP  zolpidem (AMBIEN) 10 MG tablet Take 1 tablet (10 mg total) by mouth at bedtime. 07/05/16 08/04/16  Scot Jun, FNP    Family History Family History  Problem Relation Age of Onset  . Sickle cell trait Mother   . Depression Mother   . Diabetes Mother   . Sickle cell trait Father   . Sickle cell trait Brother     Social History Social History  Substance Use Topics  . Smoking status: Former Smoker    Packs/day: 0.50    Years: 10.00    Types: Cigarettes    Quit date: 05/29/2011  . Smokeless tobacco: Never Used  . Alcohol use No     Allergies   Patient has no known allergies.   Review of Systems Review of Systems  All other systems reviewed and are negative.    Physical Exam Updated Vital Signs BP 112/78   Pulse 84   Temp 98.2 F (36.8 C)   Resp (!) 22   Wt 89.4 kg (197 lb)   SpO2 95%   BMI 26.72 kg/m    Physical Exam  Constitutional: He is oriented to person, place, and time. He appears well-developed.  HENT:  Head: Atraumatic.  Neck: Neck supple.  Cardiovascular: Normal rate.   Pulmonary/Chest: Effort normal.  Musculoskeletal: He exhibits tenderness. He exhibits no edema or deformity.  Neurological: He is alert and oriented to person, place, and time.  Skin: Skin is warm.  Nursing note and vitals reviewed.    ED Treatments /  Results  Labs (all labs ordered are listed, but only abnormal results are displayed) Labs Reviewed  RETICULOCYTES - Abnormal; Notable for the following:       Result Value   Retic Ct Pct 5.2 (*)    RBC. 3.08 (*)    All other components within normal limits  CBC WITH DIFFERENTIAL/PLATELET - Abnormal; Notable for the following:    WBC 13.1 (*)    RBC 3.08 (*)    Hemoglobin 8.9 (*)    HCT 26.0 (*)    RDW 18.3 (*)    Platelets 502 (*)    Neutro Abs 9.9 (*)    Monocytes Absolute 1.9 (*)    All other components within normal limits  COMPREHENSIVE METABOLIC PANEL - Abnormal; Notable for the following:    Glucose, Bld 114 (*)    Total Protein 8.3 (*)    AST 60 (*)    Total Bilirubin 5.8 (*)    All other components within normal limits    EKG  EKG Interpretation None       Radiology No results found.  Procedures Procedures (including critical care time)  Medications Ordered in ED Medications  HYDROmorphone (DILAUDID) injection 2 mg (2 mg Intravenous Given 07/03/16 1647)    Or  HYDROmorphone (DILAUDID) injection 2 mg ( Subcutaneous See Alternative 07/03/16 1647)  HYDROmorphone (DILAUDID) injection 2 mg (2 mg Intravenous Given 07/03/16 1738)    Or  HYDROmorphone (DILAUDID) injection 2 mg ( Subcutaneous See Alternative 07/03/16 1738)  HYDROmorphone (DILAUDID) injection 2 mg (2 mg Intravenous Given 07/03/16 1810)    Or  HYDROmorphone (DILAUDID) injection 2 mg ( Subcutaneous See Alternative 07/03/16 1810)  heparin lock flush 100 unit/mL (500 Units  Intracatheter Given 07/03/16 2052)     Initial Impression / Assessment and Plan / ED Course  I have reviewed the triage vital signs and the nursing notes.  Pertinent labs & imaging results that were available during my care of the patient were reviewed by me and considered in my medical decision making (see chart for details).     Pain crisis - improved pain with iv meds. Strict ER return precautions have been discussed, and patient is agreeing with the plan and is comfortable with the workup done and the recommendations from the ER.   Final Clinical Impressions(s) / ED Diagnoses   Final diagnoses:  Sickle cell pain crisis [facility])    New Prescriptions Discharge Medication List as of 07/03/2016  8:44 PM       Varney Biles, MD 07/08/16 [phone]

## 2016-07-08 NOTE — Telephone Encounter (Signed)
Patient C/O pain to rib cage and legs that he rates 10/10 on pain scale.  Patient states he has not taken any medication because he is out of it.  Patient denies chest pain or shortness of breath, N/V/D, fever or abdominal pain.  Advised patient I would speak with provider and call him back.

## 2016-07-08 NOTE — ED Triage Notes (Signed)
Patient c/o sickle cell pain-right rib cage x 2 days. No SOB. No chest pain.

## 2016-07-08 NOTE — Progress Notes (Signed)
Refilled MS contin

## 2016-07-08 NOTE — Telephone Encounter (Signed)
Per provider, patient can come pick up his home medication and take it as prescribed, hydrate and rest.  Patient verbalizes understanding.

## 2016-07-08 NOTE — ED Provider Notes (Signed)
[facility] DEPT Provider Note   CSN: [id number] Arrival date & time: 07/08/16  1212     History   Chief Complaint Chief Complaint  Patient presents with  . Sickle Cell Pain Crisis    HPI [name] is a 36 y.o. male.  36 year old male with history of sickle cell disease who presents with chronic pain. Pain is his usual location which is his rib cage but he denies being short of breath or chest pain. No pleuritic component to it. Patient seen here several hours ago in the ED overnight and medicated with 3 doses of IV hydromorphone and felt better and was discharged from here and went to the sickle cell clinic. He was seen there today for pain medication refill and due to the nurse practitioner Molli Barrows. Patient was instructed to take his home medications but instead he came to the ED. I spoke with Molli Barrows who stated that the patient did not appear to be in acute crisis at this time. Patient has been the ED several days as week for similar symptoms and blood work is reassuring. He denies any other symptoms.      Past Medical History:  Diagnosis Date  . Acute chest syndrome ([place]) 06/18/2013  . Acute embolism and thrombosis of right internal jugular vein ([place])   . Alcohol consumption of one to four drinks per day   . Avascular necrosis (HCC)    Right Hip  . Blood transfusion   . Chronic anticoagulation   . Demand ischemia ([place]) 01/02/2014  . Former smoker   . Functional asplenia   . Hb-SS disease with crisis ([place])   . History of Clostridium difficile infection   . History of pulmonary embolus (PE)   . Hypertension   . Hypokalemia   . Leukocytosis    Chronic  . Mood disorder ([place])   . Noncompliance with medication regimen   . Oxygen deficiency   . Pulmonary hypertension ([place])   . Second hand tobacco smoke exposure   . Sickle cell anemia (HCC)   . Sickle-cell crisis with associated acute chest syndrome ([place]) 05/13/2013  . Stroke ([place])   .  Thrombocytosis (HCC)    Chronic  . Uses marijuana     Patient Active Problem List   Diagnosis Date Noted  . Sickle cell disease with crisis ([place]) 06/25/2016  . Sickle cell crisis ([place]) 02/23/2016  . H/O arterial ischemic stroke 11/11/2015  . Slurred speech 11/10/2015  . Chronic anemia   . Hb-SS disease without crisis ([place]) 10/07/2014  . Prolonged Q-T interval on ECG 09/10/2014  . Troponin level elevated 08/29/2014  . Cor pulmonale, chronic ([place]) 08/25/2014  . Sickle cell anemia ([place]) 06/25/2014  . Anemia of chronic disease 06/25/2014  . PAH (pulmonary artery hypertension) ([place]) 03/18/2014  . Chronic respiratory failure with hypoxia ([place]) 03/14/2014  . Paralytic strabismus, external ophthalmoplegia   . Chronic pain syndrome 12/12/2013  . Chronic anticoagulation 08/22/2013  . Essential hypertension 08/22/2013  . Pulmonary HTN ([place]) 06/18/2013  . Functional asplenia   . Vitamin D deficiency 02/13/2013  . Hx of pulmonary embolus 06/29/2012  . 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 Avascular necrosis [facility])     Past Surgical History:  Procedure Laterality Date  . CHOLECYSTECTOMY     01/2008  . Excision of left periauricular cyst     10/2009  . Excision of right ear lobe cyst with primary closur     11/2007  . Porta cath placement    .  Porta cath removal    . PORTACATH PLACEMENT  01/05/2012   Procedure: INSERTION PORT-A-CATH;  Surgeon: Odis Hollingshead, MD;  Location: [facility];  Service: General;  Laterality: N/A;  ultrasound guiced port a cath insertion with fluoroscopy  . Right hip replacement     08/2006  . UMBILICAL HERNIA REPAIR     01/2008       Home Medications    Prior to Admission medications   Medication Sig Start Date End Date Taking? Authorizing Provider  carvedilol (COREG) 3.125 MG tablet Take 1 tablet (3.125 mg total) by mouth 2 (two) times daily with a meal. 05/17/16  Yes [name], MD  cholecalciferol (VITAMIN D) 1000 units tablet Take 2,000  Units by mouth daily.   Yes [provider]  folic acid (FOLVITE) 1 MG tablet Take 2 tablets (2 mg total) by mouth daily. 04/25/16  Yes [name], MD  gabapentin (NEURONTIN) 300 MG capsule Take 1 capsule (300 mg total) by mouth 3 (three) times daily. 06/01/15  Yes Tresa Garter, MD  HYDROmorphone (DILAUDID) 4 MG tablet Take 1 tablet (4 mg total) by mouth every 4 (four) hours as needed for moderate pain or severe pain. 07/07/16 07/22/16 Yes Jegede, Marlena Clipper, MD  hydroxyurea (HYDREA) 500 MG capsule Take 500 mg by mouth every other day. May take with food to minimize GI side effects. 06/25/16  Yes [provider]  hydroxyurea (HYDREA) 500 MG capsule Take 1,000 mg by mouth every other day. May take with food to minimize GI side effects. 06/24/16  Yes [provider]  L-glutamine (ENDARI) 5 g PACK Powder Packet Take 15 g by mouth 2 (two) times daily. 04/25/16  Yes [name], MD  lisinopril (PRINIVIL,ZESTRIL) 2.5 MG tablet Take 2.5 mg by mouth at bedtime.  05/22/16  Yes [provider]  morphine (MS CONTIN) 30 MG 12 hr tablet Take 1 tablet (30 mg total) by mouth every 12 (twelve) hours. 07/09/16 08/08/16 Yes Scot Jun, FNP  potassium chloride SA (K-DUR,KLOR-CON) 20 MEQ tablet Take 2 tablets (40 mEq total) by mouth daily. 04/27/16  Yes [name], MD  rivaroxaban (XARELTO) 20 MG TABS tablet Take 1 tablet (20 mg total) by mouth at bedtime. 06/23/16  Yes Dorena Dew, FNP  torsemide (DEMADEX) 20 MG tablet Take 1 tablet (20 mg total) by mouth daily as needed (leg swelling). 06/13/16  Yes [name], MD  zolpidem (AMBIEN) 10 MG tablet Take 1 tablet (10 mg total) by mouth at bedtime. 07/05/16 08/04/16 Yes Scot Jun, FNP    Family History Family History  Problem Relation Age of Onset  . Sickle cell trait Mother   . Depression Mother   . Diabetes Mother   . Sickle cell trait Father   . Sickle cell trait Brother      Social History Social History  Substance Use Topics  . Smoking status: Former Smoker    Packs/day: 0.50    Years: 10.00    Types: Cigarettes    Quit date: 05/29/2011  . Smokeless tobacco: Never Used  . Alcohol use No     Allergies   Patient has no known allergies.   Review of Systems Review of Systems  All other systems reviewed and are negative.    Physical Exam Updated Vital Signs BP 110/74 (BP Location: Left Arm)   Pulse 87   Temp 98.4 F (36.9 C) (Oral)   Resp 16   Ht 1.829 m (  6')   Wt 88.5 kg (195 lb)   SpO2 93%   BMI 26.45 kg/m   Physical Exam  Constitutional: He is oriented to person, place, and time. He appears well-developed and well-nourished.  Non-toxic appearance. No distress.  HENT:  Head: Normocephalic and atraumatic.  Eyes: Conjunctivae, EOM and lids are normal. Pupils are equal, round, and reactive to light.  Neck: Normal range of motion. Neck supple. No tracheal deviation present. No thyroid mass present.  Cardiovascular: Normal rate, regular rhythm and normal heart sounds.  Exam reveals no gallop.   No murmur heard. Pulmonary/Chest: Effort normal and breath sounds normal. No stridor. No respiratory distress. He has no decreased breath sounds. He has no wheezes. He has no rhonchi. He has no rales.  Abdominal: Soft. Normal appearance and bowel sounds are normal. He exhibits no distension. There is no tenderness. There is no rebound and no CVA tenderness.  Musculoskeletal: Normal range of motion. He exhibits no edema or tenderness.  Neurological: He is alert and oriented to person, place, and time. He has normal strength. No cranial nerve deficit or sensory deficit. GCS eye subscore is 4. GCS verbal subscore is 5. GCS motor subscore is 6.  Skin: Skin is warm and dry. No abrasion and no rash noted.  Psychiatric: He has a normal mood and affect. His speech is normal and behavior is normal.  Nursing note and vitals reviewed.    ED Treatments /  Results  Labs (all labs ordered are listed, but only abnormal results are displayed) Labs Reviewed - No data to display  EKG  EKG Interpretation None       Radiology No results found.  Procedures Procedures (including critical care time)  Medications Ordered in ED Medications - No data to display   Initial Impression / Assessment and Plan / ED Course  I have reviewed the triage vital signs and the nursing notes.  Pertinent labs & imaging results that were available during my care of the patient were reviewed by me and considered in my medical decision making (see chart for details).     Patient does not appear to be in acute sickle cell crisis at this time. He is afebrile. File signs are stable. Review of the blood work from several hours ago shows a stable hemoglobin. I informed patient that he needs to take his home medications. He agrees that his current symptoms are similar to his prior pain crisis. I have no suspicion for acute chest or other severe illness at this time. He is agreeable to this and will be discharged  Final Clinical Impressions(s) / ED Diagnoses   Final diagnoses:  None    New Prescriptions New Prescriptions   No medications on file     Lacretia Leigh, MD 07/08/16 1327

## 2016-07-08 NOTE — Discharge Instructions (Signed)
Take your home medications as prescribed and follow-up in the clinic on Monday if needed

## 2016-07-09 DIAGNOSIS — R0781 Pleurodynia: Secondary | ICD-10-CM | POA: Diagnosis not present

## 2016-07-09 DIAGNOSIS — D571 Sickle-cell disease without crisis: Secondary | ICD-10-CM | POA: Diagnosis not present

## 2016-07-09 DIAGNOSIS — M79601 Pain in right arm: Secondary | ICD-10-CM | POA: Diagnosis not present

## 2016-07-09 DIAGNOSIS — D5700 Hb-SS disease with crisis, unspecified: Secondary | ICD-10-CM | POA: Diagnosis not present

## 2016-07-09 DIAGNOSIS — M545 Low back pain: Secondary | ICD-10-CM | POA: Diagnosis not present

## 2016-07-09 DIAGNOSIS — M79604 Pain in right leg: Secondary | ICD-10-CM | POA: Diagnosis not present

## 2016-07-09 DIAGNOSIS — M79605 Pain in left leg: Secondary | ICD-10-CM | POA: Diagnosis not present

## 2016-07-09 DIAGNOSIS — M791 Myalgia: Secondary | ICD-10-CM | POA: Diagnosis not present

## 2016-07-09 DIAGNOSIS — M79602 Pain in left arm: Secondary | ICD-10-CM | POA: Diagnosis not present

## 2016-07-09 DIAGNOSIS — I517 Cardiomegaly: Secondary | ICD-10-CM | POA: Diagnosis not present

## 2016-07-12 DIAGNOSIS — D5700 Hb-SS disease with crisis, unspecified: Secondary | ICD-10-CM | POA: Diagnosis not present

## 2016-07-13 ENCOUNTER — Telehealth (HOSPITAL_COMMUNITY): Payer: Self-pay | Admitting: *Deleted

## 2016-07-13 NOTE — Telephone Encounter (Signed)
Patient called requesting to come to the Patient [name] for treatment. Pt stated his pain was 8/10 and it was in his ankles, legs and back. He last took his Dilaudid and MS contin at 630 this morning. He denies fever, chest pain, nausea, vomiting, diarrhea, abd pain or priapism.  Patient was placed on hold while checking with the provider. Pt voiced understanding.  After checking with the provider,pt was told that he needed to stay home, rest, hydrate and cont to take his pain meds as prescribed. Pt voiced understanding.

## 2016-07-14 ENCOUNTER — Emergency Department (HOSPITAL_COMMUNITY): Payer: Medicare Other

## 2016-07-14 ENCOUNTER — Emergency Department (HOSPITAL_COMMUNITY)
Admission: EM | Admit: 2016-07-14 | Discharge: 2016-07-14 | Disposition: A | Discharge status: Home / Self Care | Payer: Medicare Other | Attending: Emergency Medicine | Admitting: Emergency Medicine

## 2016-07-14 ENCOUNTER — Encounter (HOSPITAL_COMMUNITY): Payer: Self-pay | Admitting: Emergency Medicine

## 2016-07-14 DIAGNOSIS — G894 Chronic pain syndrome: Secondary | ICD-10-CM

## 2016-07-14 DIAGNOSIS — D649 Anemia, unspecified: Secondary | ICD-10-CM | POA: Diagnosis not present

## 2016-07-14 DIAGNOSIS — R079 Chest pain, unspecified: Secondary | ICD-10-CM | POA: Diagnosis not present

## 2016-07-14 DIAGNOSIS — Z7901 Long term (current) use of anticoagulants: Secondary | ICD-10-CM | POA: Diagnosis not present

## 2016-07-14 DIAGNOSIS — Z8673 Personal history of transient ischemic attack (TIA), and cerebral infarction without residual deficits: Secondary | ICD-10-CM | POA: Insufficient documentation

## 2016-07-14 DIAGNOSIS — D5700 Hb-SS disease with crisis, unspecified: Secondary | ICD-10-CM | POA: Insufficient documentation

## 2016-07-14 DIAGNOSIS — D571 Sickle-cell disease without crisis: Secondary | ICD-10-CM | POA: Diagnosis not present

## 2016-07-14 DIAGNOSIS — Z79899 Other long term (current) drug therapy: Secondary | ICD-10-CM | POA: Insufficient documentation

## 2016-07-14 DIAGNOSIS — I10 Essential (primary) hypertension: Secondary | ICD-10-CM | POA: Diagnosis not present

## 2016-07-14 DIAGNOSIS — G890 Central pain syndrome: Secondary | ICD-10-CM | POA: Diagnosis not present

## 2016-07-14 LAB — CBC WITH DIFFERENTIAL/PLATELET
BASOS ABS: 0.1 10*3/uL (ref 0.0–0.1)
BASOS PCT: 1 %
Eosinophils Absolute: 0.5 10*3/uL (ref 0.0–0.7)
Eosinophils Relative: 3 %
HEMATOCRIT: 23.7 % — AB (ref 39.0–52.0)
HEMOGLOBIN: 8.2 g/dL — AB (ref 13.0–17.0)
LYMPHS PCT: 15 %
Lymphs Abs: 2.5 10*3/uL (ref 0.7–4.0)
MCH: 30.6 pg (ref 26.0–34.0)
MCHC: 34.6 g/dL (ref 30.0–36.0)
MCV: 88.4 fL (ref 78.0–100.0)
MONO ABS: 2.8 10*3/uL — AB (ref 0.1–1.0)
Monocytes Relative: 17 %
NEUTROS ABS: 11 10*3/uL — AB (ref 1.7–7.7)
NEUTROS PCT: 64 %
Platelets: 243 10*3/uL (ref 150–400)
RBC: 2.68 MIL/uL — AB (ref 4.22–5.81)
RDW: 17.1 % — ABNORMAL HIGH (ref 11.5–15.5)
WBC: 16.9 10*3/uL — AB (ref 4.0–10.5)

## 2016-07-14 LAB — RETICULOCYTES
RBC.: 2.68 MIL/uL — AB (ref 4.22–5.81)
RETIC COUNT ABSOLUTE: 214.4 10*3/uL — AB (ref 19.0–186.0)
Retic Ct Pct: 8 % — ABNORMAL HIGH (ref 0.4–3.1)

## 2016-07-14 LAB — URINALYSIS, MICROSCOPIC (REFLEX): BACTERIA UA: NONE SEEN

## 2016-07-14 LAB — URINALYSIS, ROUTINE W REFLEX MICROSCOPIC
BILIRUBIN URINE: NEGATIVE
Glucose, UA: NEGATIVE mg/dL
Ketones, ur: NEGATIVE mg/dL
Leukocytes, UA: NEGATIVE
Nitrite: NEGATIVE
PH: 6.5 (ref 5.0–8.0)
Protein, ur: NEGATIVE mg/dL
SPECIFIC GRAVITY, URINE: 1.02 (ref 1.005–1.030)

## 2016-07-14 LAB — COMPREHENSIVE METABOLIC PANEL
ALBUMIN: 4.1 g/dL (ref 3.5–5.0)
ALT: 19 U/L (ref 17–63)
AST: 30 U/L (ref 15–41)
Alkaline Phosphatase: 88 U/L (ref 38–126)
Anion gap: 7 (ref 5–15)
BUN: 8 mg/dL (ref 6–20)
CHLORIDE: 106 mmol/L (ref 101–111)
CO2: 25 mmol/L (ref 22–32)
CREATININE: 0.78 mg/dL (ref 0.61–1.24)
Calcium: 8.6 mg/dL — ABNORMAL LOW (ref 8.9–10.3)
GFR calc Af Amer: >60 mL/min (ref >60)
GFR calc non Af Amer: >60 mL/min (ref >60)
Glucose, Bld: 125 mg/dL — ABNORMAL HIGH (ref 65–99)
POTASSIUM: 3.6 mmol/L (ref 3.5–5.1)
SODIUM: 138 mmol/L (ref 135–145)
Total Bilirubin: 3.6 mg/dL — ABNORMAL HIGH (ref 0.3–1.2)
Total Protein: 7.4 g/dL (ref 6.5–8.1)

## 2016-07-14 LAB — LACTATE DEHYDROGENASE: LDH: 285 U/L — AB (ref 98–192)

## 2016-07-14 MED ORDER — HYDROMORPHONE HCL 1 MG/ML IJ SOLN: 2.0000 mg | INTRAMUSCULAR | Status: DC

## 2016-07-14 MED ORDER — DIPHENHYDRAMINE HCL 50 MG/ML IJ SOLN
25.0000 mg | Freq: Once | INTRAMUSCULAR | Status: AC
  Administered 2016-07-14: 25 mg via INTRAVENOUS
  Filled 2016-07-14: qty 1

## 2016-07-14 MED ORDER — DEXTROSE-NACL 5-0.45 % IV SOLN
INTRAVENOUS | Status: DC
  Administered 2016-07-14: 08:00:00 via INTRAVENOUS

## 2016-07-14 MED ORDER — HYDROMORPHONE HCL 1 MG/ML IJ SOLN: 2.0000 mg | INTRAMUSCULAR | Status: AC

## 2016-07-14 MED ORDER — HYDROMORPHONE HCL 1 MG/ML IJ SOLN
1.0000 mg | INTRAMUSCULAR | Status: AC
  Administered 2016-07-14: 1 mg via INTRAVENOUS
  Filled 2016-07-14: qty 1

## 2016-07-14 MED ORDER — HEPARIN SOD (PORK) LOCK FLUSH 100 UNIT/ML IV SOLN
500.0000 [IU] | Freq: Once | INTRAVENOUS | Status: AC
  Administered 2016-07-14: 500 [IU]
  Filled 2016-07-14: qty 5

## 2016-07-14 MED ORDER — HYDROMORPHONE HCL 1 MG/ML IJ SOLN
2.0000 mg | INTRAMUSCULAR | Status: AC
  Administered 2016-07-14: 2 mg via INTRAVENOUS
  Filled 2016-07-14: qty 2

## 2016-07-14 NOTE — ED Triage Notes (Signed)
Pt states he woke up around 4am this morning with pain in both his sides, lower back, thighs, and ankles

## 2016-07-14 NOTE — ED Provider Notes (Signed)
[facility] DEPT Provider Note   CSN: [id number] Arrival date & time: 07/14/16  0536     History   Chief Complaint Chief Complaint  Patient presents with  . Sickle Cell Pain Crisis    HPI [name] is a 36 y.o. male.  The history is provided by the patient. No language interpreter was used.  Sickle Cell Pain Crisis   [name] is a 36 y.o. male who presents to the Emergency Department complaining of sickle cell pain crisis.  He presents for pain in bilateral sides, bilateral thighs, bilateral ankles. Pain began yesterday and worsened overnight. It is typical for his pain crises. He denies any shortness of breath, vomiting, abdominal pain, diarrhea, chest pain. He has a history of pulmonary embolism and is on Xarelto. He is also on home oxygen, 3 L chronically. He last took his MS Contin and Dilaudid at 4 AM this morning with no relief of his pain. Past Medical History:  Diagnosis Date  . Acute chest syndrome ([place]) 06/18/2013  . Acute embolism and thrombosis of right internal jugular vein ([place])   . Alcohol consumption of one to four drinks per day   . Avascular necrosis (HCC)    Right Hip  . Blood transfusion   . Chronic anticoagulation   . Demand ischemia ([place]) 01/02/2014  . Former smoker   . Functional asplenia   . Hb-SS disease with crisis ([place])   . History of Clostridium difficile infection   . History of pulmonary embolus (PE)   . Hypertension   . Hypokalemia   . Leukocytosis    Chronic  . Mood disorder ([place])   . Noncompliance with medication regimen   . Oxygen deficiency   . Pulmonary hypertension ([place])   . Second hand tobacco smoke exposure   . Sickle cell anemia (HCC)   . Sickle-cell crisis with associated acute chest syndrome ([place]) 05/13/2013  . Stroke ([place])   . Thrombocytosis (HCC)    Chronic  . Uses marijuana     Patient Active Problem List   Diagnosis Date Noted  . Sickle cell disease with crisis ([place]) 06/25/2016  . Sickle cell  crisis ([place]) 02/23/2016  . H/O arterial ischemic stroke 11/11/2015  . Slurred speech 11/10/2015  . Chronic anemia   . Hb-SS disease without crisis ([place]) 10/07/2014  . Prolonged Q-T interval on ECG 09/10/2014  . Troponin level elevated 08/29/2014  . Cor pulmonale, chronic ([place]) 08/25/2014  . Sickle cell anemia ([place]) 06/25/2014  . Anemia of chronic disease 06/25/2014  . PAH (pulmonary artery hypertension) ([place]) 03/18/2014  . Chronic respiratory failure with hypoxia ([place]) 03/14/2014  . Paralytic strabismus, external ophthalmoplegia   . Chronic pain syndrome 12/12/2013  . Chronic anticoagulation 08/22/2013  . Essential hypertension 08/22/2013  . Pulmonary HTN ([place]) 06/18/2013  . Functional asplenia   . Vitamin D deficiency 02/13/2013  . Hx of pulmonary embolus 06/29/2012  . 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 Avascular necrosis [facility])     Past Surgical History:  Procedure Laterality Date  . CHOLECYSTECTOMY     01/2008  . Excision of left periauricular cyst     10/2009  . Excision of right ear lobe cyst with primary closur     11/2007  . Porta cath placement    . Porta cath removal    . PORTACATH PLACEMENT  01/05/2012   Procedure: INSERTION PORT-A-CATH;  Surgeon: Odis Hollingshead, MD;  Location: [facility];  Service: General;  Laterality: N/A;  ultrasound guiced port  a cath insertion with fluoroscopy  . Right hip replacement     08/2006  . UMBILICAL HERNIA REPAIR     01/2008       Home Medications    Prior to Admission medications   Medication Sig Start Date End Date Taking? Authorizing Provider  carvedilol (COREG) 3.125 MG tablet Take 1 tablet (3.125 mg total) by mouth 2 (two) times daily with a meal. 05/17/16  Yes [name], MD  cholecalciferol (VITAMIN D) 1000 units tablet Take 2,000 Units by mouth daily.   Yes [provider]  folic acid (FOLVITE) 1 MG tablet Take 2 tablets (2 mg total) by mouth daily. 04/25/16  Yes [name], MD  gabapentin  (NEURONTIN) 300 MG capsule Take 1 capsule (300 mg total) by mouth 3 (three) times daily. 06/01/15  Yes Tresa Garter, MD  HYDROmorphone (DILAUDID) 4 MG tablet Take 1 tablet (4 mg total) by mouth every 4 (four) hours as needed for moderate pain or severe pain. 07/07/16 07/22/16 Yes Jegede, Marlena Clipper, MD  hydroxyurea (HYDREA) 500 MG capsule Take 500 mg by mouth every other day. May take with food to minimize GI side effects. 06/25/16  Yes [provider]  hydroxyurea (HYDREA) 500 MG capsule Take 1,000 mg by mouth every other day. May take with food to minimize GI side effects. 06/24/16  Yes [provider]  L-glutamine (ENDARI) 5 g PACK Powder Packet Take 15 g by mouth 2 (two) times daily. 04/25/16  Yes [name], MD  lisinopril (PRINIVIL,ZESTRIL) 2.5 MG tablet Take 2.5 mg by mouth at bedtime.  05/22/16  Yes [provider]  morphine (MS CONTIN) 30 MG 12 hr tablet Take 1 tablet (30 mg total) by mouth every 12 (twelve) hours. 07/09/16 08/08/16 Yes Scot Jun, FNP  potassium chloride SA (K-DUR,KLOR-CON) 20 MEQ tablet Take 2 tablets (40 mEq total) by mouth daily. 04/27/16  Yes [name], MD  rivaroxaban (XARELTO) 20 MG TABS tablet Take 1 tablet (20 mg total) by mouth at bedtime. 06/23/16  Yes Dorena Dew, FNP  torsemide (DEMADEX) 20 MG tablet Take 1 tablet (20 mg total) by mouth daily as needed (leg swelling). 06/13/16  Yes [name], MD  zolpidem (AMBIEN) 10 MG tablet Take 1 tablet (10 mg total) by mouth at bedtime. 07/05/16 08/04/16 Yes Scot Jun, FNP    Family History Family History  Problem Relation Age of Onset  . Sickle cell trait Mother   . Depression Mother   . Diabetes Mother   . Sickle cell trait Father   . Sickle cell trait Brother     Social History Social History  Substance Use Topics  . Smoking status: Former Smoker    Packs/day: 0.50    Years: 10.00    Types: Cigarettes    Quit date: 05/29/2011  .  Smokeless tobacco: Never Used  . Alcohol use No     Allergies   Patient has no known allergies.   Review of Systems Review of Systems  All other systems reviewed and are negative.    Physical Exam Updated Vital Signs BP 137/85   Pulse (!) 39   Temp 98.3 F (36.8 C) (Oral)   Resp 18   Ht 6' (1.829 m)   Wt 88.9 kg (196 lb)   SpO2 94%   BMI 26.58 kg/m   Physical Exam  Constitutional: He is oriented to person, place, and time. He appears well-developed and well-nourished.  HENT:  Head: Normocephalic and atraumatic.  Cardiovascular: Regular rhythm.   Murmur heard. Tachycardic, systolic ejection murmur  Pulmonary/Chest: Effort normal and breath sounds normal. No respiratory distress.  Abdominal: Soft. There is no tenderness. There is no rebound and no guarding.  Musculoskeletal: He exhibits no edema or tenderness.  Neurological: He is oriented to person, place, and time.  Drowsy appearing but conversant  Skin: Skin is warm and dry.  Psychiatric: He has a normal mood and affect. His behavior is normal.  Nursing note and vitals reviewed.    ED Treatments / Results  Labs (all labs ordered are listed, but only abnormal results are displayed) Labs Reviewed  CBC WITH DIFFERENTIAL/PLATELET - Abnormal; Notable for the following:       Result Value   WBC 16.9 (*)    RBC 2.68 (*)    Hemoglobin 8.2 (*)    HCT 23.7 (*)    RDW 17.1 (*)    Neutro Abs 11.0 (*)    Monocytes Absolute 2.8 (*)    All other components within normal limits  RETICULOCYTES - Abnormal; Notable for the following:    Retic Ct Pct 8.0 (*)    RBC. 2.68 (*)    Retic Count, Manual 214.4 (*)    All other components within normal limits  COMPREHENSIVE METABOLIC PANEL - Abnormal; Notable for the following:    Glucose, Bld 125 (*)    Calcium 8.6 (*)    Total Bilirubin 3.6 (*)    All other components within normal limits  URINALYSIS, ROUTINE W REFLEX MICROSCOPIC - Abnormal; Notable for the following:     Color, Urine ORANGE (*)    Hgb urine dipstick SMALL (*)    All other components within normal limits  LACTATE DEHYDROGENASE - Abnormal; Notable for the following:    LDH 285 (*)    All other components within normal limits  URINALYSIS, MICROSCOPIC (REFLEX) - Abnormal; Notable for the following:    Squamous Epithelial / LPF 0-5 (*)    All other components within normal limits  HEMOGLOBINOPATHY EVALUATION    EKG  EKG Interpretation None       Radiology Dg Chest Port 1 View  Result Date: 07/14/2016 CLINICAL DATA:  36 year old male with history of sickle cell disease presenting with bilateral chest pain, body pain and shortness of breath today. EXAM: PORTABLE CHEST 1 VIEW COMPARISON:  Chest x-ray 07/03/2016. FINDINGS: Mild blunting of the left costophrenic sulcus and obscuration of the lateral left hemidiaphragm suggesting a small left pleural effusion. No right pleural effusion. No definite consolidative airspace disease. Scattered areas of linear scarring in the lung bases, similar to prior studies. Cephalization of the pulmonary vasculature, without frank pulmonary edema. No pneumothorax. Heart size is mildly enlarged. Upper mediastinal contours are within normal limits. Left-sided subclavian single-lumen power porta cath with tip terminating at the superior cavoatrial junction. IMPRESSION: 1. New small left pleural effusion. 2. Chronic scarring in the lung bases bilaterally. 3. Cardiomegaly with pulmonary venous congestion, but no frank pulmonary edema. Electronically Signed   By: Vinnie Langton M.D.   On: 07/14/2016 08:07    Procedures Procedures (including critical care time)  Medications Ordered in ED Medications  HYDROmorphone (DILAUDID) injection 2 mg (0 mg Intravenous Hold 07/14/16 0933)    Or  HYDROmorphone (DILAUDID) injection 2 mg ( Subcutaneous See Alternative 07/14/16 0933)  HYDROmorphone (DILAUDID) injection 2 mg (2 mg Intravenous Given 07/14/16 0811)    Or    HYDROmorphone (DILAUDID) injection 2 mg ( Subcutaneous See Alternative  07/14/16 0811)  diphenhydrAMINE (BENADRYL) injection 25 mg (25 mg Intravenous Given 07/14/16 0811)  HYDROmorphone (DILAUDID) injection 1 mg (1 mg Intravenous Given 07/14/16 0903)    Or  HYDROmorphone (DILAUDID) injection 2 mg ( Subcutaneous See Alternative 07/14/16 0903)  heparin lock flush 100 unit/mL (500 Units Intracatheter Given 07/14/16 1131)     Initial Impression / Assessment and Plan / ED Course  I have reviewed the triage vital signs and the nursing notes.  Pertinent labs & imaging results that were available during my care of the patient were reviewed by me and considered in my medical decision making (see chart for details).    Pt with hx/o sickle cell disease, chronic pain syndrome here for rib, thigh, ankle pain that is similar to his prior pain crisis.  He states he is compliant on medications.  He was discharged from [facility] two days ago following admission for pain crisis and had apheresis performed 07/12/16.  No evidence of acute chest.  Presentation is not c/w PE, ACS, dissection, pna, CHF.  Given pts apheresis it is highly unlikely that he is having a recurrent vasoocculsive crisis.  Plan to d/c home with outpatient follow up, return precautions.    Final Clinical Impressions(s) / ED Diagnoses   Final diagnoses:  Sickle cell anemia with pain ([place])  Chronic pain syndrome    New Prescriptions Discharge Medication List as of 07/14/2016 11:10 AM       Quintella Reichert, MD 07/14/16 1557

## 2016-07-16 ENCOUNTER — Encounter (HOSPITAL_COMMUNITY): Payer: Self-pay | Admitting: Nurse Practitioner

## 2016-07-16 DIAGNOSIS — D57219 Sickle-cell/Hb-C disease with crisis, unspecified: Secondary | ICD-10-CM | POA: Insufficient documentation

## 2016-07-16 DIAGNOSIS — Z79899 Other long term (current) drug therapy: Secondary | ICD-10-CM | POA: Diagnosis not present

## 2016-07-16 DIAGNOSIS — M79652 Pain in left thigh: Secondary | ICD-10-CM | POA: Insufficient documentation

## 2016-07-16 DIAGNOSIS — I10 Essential (primary) hypertension: Secondary | ICD-10-CM | POA: Diagnosis not present

## 2016-07-16 DIAGNOSIS — M25572 Pain in left ankle and joints of left foot: Secondary | ICD-10-CM | POA: Diagnosis not present

## 2016-07-16 DIAGNOSIS — Z7901 Long term (current) use of anticoagulants: Secondary | ICD-10-CM | POA: Insufficient documentation

## 2016-07-16 DIAGNOSIS — M25571 Pain in right ankle and joints of right foot: Secondary | ICD-10-CM | POA: Diagnosis not present

## 2016-07-16 DIAGNOSIS — R109 Unspecified abdominal pain: Secondary | ICD-10-CM | POA: Insufficient documentation

## 2016-07-16 DIAGNOSIS — Z87891 Personal history of nicotine dependence: Secondary | ICD-10-CM | POA: Insufficient documentation

## 2016-07-16 DIAGNOSIS — D5700 Hb-SS disease with crisis, unspecified: Secondary | ICD-10-CM | POA: Diagnosis not present

## 2016-07-16 DIAGNOSIS — M79651 Pain in right thigh: Secondary | ICD-10-CM | POA: Diagnosis present

## 2016-07-16 DIAGNOSIS — M791 Myalgia: Secondary | ICD-10-CM | POA: Diagnosis not present

## 2016-07-16 MED ORDER — HYDROMORPHONE HCL 1 MG/ML IJ SOLN: 0.5000 mg | Freq: Once | INTRAMUSCULAR | Status: DC

## 2016-07-17 ENCOUNTER — Emergency Department (HOSPITAL_COMMUNITY)
Admission: EM | Admit: 2016-07-17 | Discharge: 2016-07-17 | Disposition: A | Discharge status: Home / Self Care | Payer: Medicare Other | Attending: Emergency Medicine | Admitting: Emergency Medicine

## 2016-07-17 DIAGNOSIS — D57219 Sickle-cell/Hb-C disease with crisis, unspecified: Secondary | ICD-10-CM | POA: Diagnosis not present

## 2016-07-17 DIAGNOSIS — D5700 Hb-SS disease with crisis, unspecified: Secondary | ICD-10-CM

## 2016-07-17 LAB — COMPREHENSIVE METABOLIC PANEL
ALK PHOS: 85 U/L (ref 38–126)
ALT: 37 U/L (ref 17–63)
ANION GAP: 6 (ref 5–15)
AST: 52 U/L — ABNORMAL HIGH (ref 15–41)
Albumin: 4 g/dL (ref 3.5–5.0)
BUN: 9 mg/dL (ref 6–20)
CALCIUM: 8.6 mg/dL — AB (ref 8.9–10.3)
CO2: 23 mmol/L (ref 22–32)
Chloride: 108 mmol/L (ref 101–111)
Creatinine, Ser: 0.73 mg/dL (ref 0.61–1.24)
GFR calc non Af Amer: >60 mL/min (ref >60)
Glucose, Bld: 115 mg/dL — ABNORMAL HIGH (ref 65–99)
POTASSIUM: 3.8 mmol/L (ref 3.5–5.1)
SODIUM: 137 mmol/L (ref 135–145)
TOTAL PROTEIN: 7.2 g/dL (ref 6.5–8.1)
Total Bilirubin: 4.4 mg/dL — ABNORMAL HIGH (ref 0.3–1.2)

## 2016-07-17 LAB — RETICULOCYTES
RBC.: 2.69 MIL/uL — AB (ref 4.22–5.81)
RETIC COUNT ABSOLUTE: 322.8 10*3/uL — AB (ref 19.0–186.0)
Retic Ct Pct: 12 % — ABNORMAL HIGH (ref 0.4–3.1)

## 2016-07-17 LAB — CBC WITH DIFFERENTIAL/PLATELET
BASOS ABS: 0.1 10*3/uL (ref 0.0–0.1)
Basophils Relative: 1 %
Eosinophils Absolute: 0.5 10*3/uL (ref 0.0–0.7)
Eosinophils Relative: 4 %
HCT: 24.2 % — ABNORMAL LOW (ref 39.0–52.0)
HEMOGLOBIN: 8.2 g/dL — AB (ref 13.0–17.0)
LYMPHS PCT: 14 %
Lymphs Abs: 1.7 10*3/uL (ref 0.7–4.0)
MCH: 30.5 pg (ref 26.0–34.0)
MCHC: 33.9 g/dL (ref 30.0–36.0)
MCV: 90 fL (ref 78.0–100.0)
MONOS PCT: 17 %
Monocytes Absolute: 2.1 10*3/uL — ABNORMAL HIGH (ref 0.1–1.0)
NEUTROS ABS: 7.9 10*3/uL — AB (ref 1.7–7.7)
NEUTROS PCT: 64 %
Platelets: 335 10*3/uL (ref 150–400)
RBC: 2.69 MIL/uL — ABNORMAL LOW (ref 4.22–5.81)
RDW: 18.2 % — ABNORMAL HIGH (ref 11.5–15.5)
WBC: 12.1 10*3/uL — ABNORMAL HIGH (ref 4.0–10.5)

## 2016-07-17 MED ORDER — HYDROMORPHONE HCL 1 MG/ML IJ SOLN
2.0000 mg | Freq: Once | INTRAMUSCULAR | Status: AC
  Administered 2016-07-17: 2 mg via INTRAVENOUS
  Filled 2016-07-17: qty 2

## 2016-07-17 MED ORDER — DIPHENHYDRAMINE HCL 25 MG PO CAPS
50.0000 mg | ORAL_CAPSULE | Freq: Once | ORAL | Status: AC
  Administered 2016-07-17: 50 mg via ORAL
  Filled 2016-07-17: qty 2

## 2016-07-17 MED ORDER — HEPARIN SOD (PORK) LOCK FLUSH 100 UNIT/ML IV SOLN
500.0000 [IU] | Freq: Once | INTRAVENOUS | Status: AC
  Administered 2016-07-17: 500 [IU]
  Filled 2016-07-17: qty 5

## 2016-07-17 MED ORDER — DEXTROSE 5 % IV BOLUS
1000.0000 mL | Freq: Once | INTRAVENOUS | Status: AC
  Administered 2016-07-17: 1000 mL via INTRAVENOUS

## 2016-07-17 NOTE — ED Provider Notes (Signed)
[facility] DEPT Provider Note   CSN: [id number] Arrival date & time: 07/16/16  2309   By signing my name below, I, Levester Fresh, attest that this documentation has been prepared under the direction and in the presence of No att. providers found . Electronically Signed: Levester Fresh, Scribe. 07/17/2016. 4:52 AM.  History   Chief Complaint Chief Complaint  Patient presents with  . Sickle Cell Pain Crisis   HPI Comments [name] is a 36 y.o. male with a PMHx significant for sickle cell disease, who presents to the Emergency Department with complaints of his typical sickle cell pain crisis x1 day, in his bilateral sides, thighs and ankles.  Sx have been constant since onset, currently rated a 8/10 in severity.  No fevers, dyspnea or chest pain.  No recent injury or trauma.  No relief with use of his normal pain medication regimen.  Pt denies experiencing any other acute sx.  The history is provided by the patient and medical records.   Past Medical History:  Diagnosis Date  . Acute chest syndrome ([place]) 06/18/2013  . Acute embolism and thrombosis of right internal jugular vein ([place])   . Alcohol consumption of one to four drinks per day   . Avascular necrosis (HCC)    Right Hip  . Blood transfusion   . Chronic anticoagulation   . Demand ischemia ([place]) 01/02/2014  . Former smoker   . Functional asplenia   . Hb-SS disease with crisis ([place])   . History of Clostridium difficile infection   . History of pulmonary embolus (PE)   . Hypertension   . Hypokalemia   . Leukocytosis    Chronic  . Mood disorder ([place])   . Noncompliance with medication regimen   . Oxygen deficiency   . Pulmonary hypertension ([place])   . Second hand tobacco smoke exposure   . Sickle cell anemia (HCC)   . Sickle-cell crisis with associated acute chest syndrome ([place]) 05/13/2013  . Stroke ([place])   . Thrombocytosis (HCC)    Chronic  . Uses marijuana     Patient Active Problem List   Diagnosis  Date Noted  . Sickle cell disease with crisis ([place]) 06/25/2016  . Sickle cell crisis ([place]) 02/23/2016  . H/O arterial ischemic stroke 11/11/2015  . Slurred speech 11/10/2015  . Chronic anemia   . Hb-SS disease without crisis ([place]) 10/07/2014  . Prolonged Q-T interval on ECG 09/10/2014  . Troponin level elevated 08/29/2014  . Cor pulmonale, chronic ([place]) 08/25/2014  . Sickle cell anemia ([place]) 06/25/2014  . Anemia of chronic disease 06/25/2014  . PAH (pulmonary artery hypertension) ([place]) 03/18/2014  . Chronic respiratory failure with hypoxia ([place]) 03/14/2014  . Paralytic strabismus, external ophthalmoplegia   . Chronic pain syndrome 12/12/2013  . Chronic anticoagulation 08/22/2013  . Essential hypertension 08/22/2013  . Pulmonary HTN ([place]) 06/18/2013  . Functional asplenia   . Vitamin D deficiency 02/13/2013  . Hx of pulmonary embolus 06/29/2012  . 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 Avascular necrosis [facility])     Past Surgical History:  Procedure Laterality Date  . CHOLECYSTECTOMY     01/2008  . Excision of left periauricular cyst     10/2009  . Excision of right ear lobe cyst with primary closur     11/2007  . Porta cath placement    . Porta cath removal    . PORTACATH PLACEMENT  01/05/2012   Procedure: INSERTION PORT-A-CATH;  Surgeon: Odis Hollingshead, MD;  Location[facility]  OR;  Service: General;  Laterality: N/A;  ultrasound guiced port a cath insertion with fluoroscopy  . Right hip replacement     08/2006  . UMBILICAL HERNIA REPAIR     01/2008       Home Medications    Prior to Admission medications   Medication Sig Start Date End Date Taking? Authorizing Provider  carvedilol (COREG) 3.125 MG tablet Take 1 tablet (3.125 mg total) by mouth 2 (two) times daily with a meal. 05/17/16  Yes [name], MD  cholecalciferol (VITAMIN D) 1000 units tablet Take 2,000 Units by mouth daily.   Yes [provider]  folic acid (FOLVITE) 1 MG tablet Take 2 tablets  (2 mg total) by mouth daily. 04/25/16  Yes [name], MD  gabapentin (NEURONTIN) 300 MG capsule Take 1 capsule (300 mg total) by mouth 3 (three) times daily. 06/01/15  Yes Tresa Garter, MD  HYDROmorphone (DILAUDID) 4 MG tablet Take 1 tablet (4 mg total) by mouth every 4 (four) hours as needed for moderate pain or severe pain. 07/07/16 07/22/16 Yes Jegede, Marlena Clipper, MD  hydroxyurea (HYDREA) 500 MG capsule Take 500 mg by mouth every other day. May take with food to minimize GI side effects. 06/25/16  Yes [provider]  hydroxyurea (HYDREA) 500 MG capsule Take 1,000 mg by mouth every other day. May take with food to minimize GI side effects. 06/24/16  Yes [provider]  L-glutamine (ENDARI) 5 g PACK Powder Packet Take 15 g by mouth 2 (two) times daily. 04/25/16  Yes [name], MD  lisinopril (PRINIVIL,ZESTRIL) 2.5 MG tablet Take 2.5 mg by mouth at bedtime.  05/22/16  Yes [provider]  morphine (MS CONTIN) 30 MG 12 hr tablet Take 1 tablet (30 mg total) by mouth every 12 (twelve) hours. 07/09/16 08/08/16 Yes Scot Jun, FNP  potassium chloride SA (K-DUR,KLOR-CON) 20 MEQ tablet Take 2 tablets (40 mEq total) by mouth daily. 04/27/16  Yes [name], MD  rivaroxaban (XARELTO) 20 MG TABS tablet Take 1 tablet (20 mg total) by mouth at bedtime. 06/23/16  Yes Dorena Dew, FNP  torsemide (DEMADEX) 20 MG tablet Take 1 tablet (20 mg total) by mouth daily as needed (leg swelling). 06/13/16  Yes [name], MD  zolpidem (AMBIEN) 10 MG tablet Take 1 tablet (10 mg total) by mouth at bedtime. 07/05/16 08/04/16 Yes Scot Jun, FNP    Family History Family History  Problem Relation Age of Onset  . Sickle cell trait Mother   . Depression Mother   . Diabetes Mother   . Sickle cell trait Father   . Sickle cell trait Brother     Social History Social History  Substance Use Topics  . Smoking status: Former Smoker    Packs/day:  0.50    Years: 10.00    Types: Cigarettes    Quit date: 05/29/2011  . Smokeless tobacco: Never Used  . Alcohol use No     Allergies   Patient has no known allergies.   Review of Systems Review of Systems  Constitutional: Negative for fever.  Respiratory: Negative for shortness of breath.   Cardiovascular: Negative for chest pain.  Musculoskeletal: Positive for arthralgias and myalgias.  All other systems reviewed and are negative.  Physical Exam Updated Vital Signs BP 108/84   Pulse 73   Temp 98.5 F (36.9 C) (Oral)   Resp 15   Ht 6' (1.829 m)   Wt 88.9  kg (196 lb)   SpO2 97%   BMI 26.58 kg/m   Physical Exam  Constitutional: He is oriented to person, place, and time. He appears well-developed and well-nourished.  HENT:  Head: Normocephalic.  Eyes: EOM are normal.  Neck: Normal range of motion.  Pulmonary/Chest: Effort normal.  Abdominal: He exhibits no distension.  Musculoskeletal: Normal range of motion.  Neurological: He is alert and oriented to person, place, and time.  Psychiatric: He has a normal mood and affect.  Nursing note and vitals reviewed.  ED Treatments / Results  DIAGNOSTIC STUDIES: Oxygen Saturation is 99% on room air, normal by my interpretation.    COORDINATION OF CARE: 12:38 AM Discussed treatment plan with pt at bedside and pt agreed to plan.  Labs (all labs ordered are listed, but only abnormal results are displayed) Labs Reviewed  COMPREHENSIVE METABOLIC PANEL - Abnormal; Notable for the following:       Result Value   Glucose, Bld 115 (*)    Calcium 8.6 (*)    AST 52 (*)    Total Bilirubin 4.4 (*)    All other components within normal limits  CBC WITH DIFFERENTIAL/PLATELET - Abnormal; Notable for the following:    WBC 12.1 (*)    RBC 2.69 (*)    Hemoglobin 8.2 (*)    HCT 24.2 (*)    RDW 18.2 (*)    Neutro Abs 7.9 (*)    Monocytes Absolute 2.1 (*)    All other components within normal limits  RETICULOCYTES - Abnormal;  Notable for the following:    Retic Ct Pct 12.0 (*)    RBC. 2.69 (*)    Retic Count, Manual 322.8 (*)    All other components within normal limits    EKG  EKG Interpretation None       Radiology No results found.  Procedures Procedures (including critical care time)  Medications Ordered in ED Medications  HYDROmorphone (DILAUDID) injection 2 mg (2 mg Intravenous Given 07/17/16 0131)  dextrose 5 % bolus 1,000 mL (0 mLs Intravenous Stopped 07/17/16 0234)  diphenhydrAMINE (BENADRYL) capsule 50 mg (50 mg Oral Given 07/17/16 0131)  HYDROmorphone (DILAUDID) injection 2 mg (2 mg Intravenous Given 07/17/16 0219)  HYDROmorphone (DILAUDID) injection 2 mg (2 mg Intravenous Given 07/17/16 0314)  heparin lock flush 100 unit/mL (500 Units Intracatheter Given 07/17/16 0424)     Initial Impression / Assessment and Plan / ED Course  I have reviewed the triage vital signs and the nursing notes.  Pertinent labs & imaging results that were available during my care of the patient were reviewed by me and considered in my medical decision making (see chart for details).     36 yo M with a chief complaint of a sickle cell pain crisis. Going on for the past few days. Feels like his normal sickle cell pain. In the same locations. Denies fevers or chest pain. Labs her baseline. Pain improved with 3 doses of medication. Discharge home.  4:52 AM:  I have discussed the diagnosis/risks/treatment options with the patient and believe the pt to be eligible for discharge home to follow-up with PCP. We also discussed returning to the ED immediately if new or worsening sx occur. We discussed the sx which are most concerning (e.g., sudden worsening pain, fever, inability to tolerate by mouth) that necessitate immediate return. Medications administered to the patient during their visit and any new prescriptions provided to the patient are listed below.  Medications given during this visit Medications  HYDROmorphone  (DILAUDID) injection 2 mg (2 mg Intravenous Given 07/17/16 0131)  dextrose 5 % bolus 1,000 mL (0 mLs Intravenous Stopped 07/17/16 0234)  diphenhydrAMINE (BENADRYL) capsule 50 mg (50 mg Oral Given 07/17/16 0131)  HYDROmorphone (DILAUDID) injection 2 mg (2 mg Intravenous Given 07/17/16 0219)  HYDROmorphone (DILAUDID) injection 2 mg (2 mg Intravenous Given 07/17/16 0314)  heparin lock flush 100 unit/mL (500 Units Intracatheter Given 07/17/16 0424)     The patient appears reasonably screen and/or stabilized for discharge and I doubt any other medical condition or other [facility] requiring further screening, evaluation, or treatment in the ED at this time prior to discharge.    Final Clinical Impressions(s) / ED Diagnoses   Final diagnoses:  Sickle cell pain crisis ([place])   I personally performed the services described in this documentation, which was scribed in my presence. The recorded information has been reviewed and is accurate.   New Prescriptions Discharge Medication List as of 07/17/2016  4:08 AM       Deno Etienne, DO 07/17/16 [phone]

## 2016-07-18 ENCOUNTER — Emergency Department (HOSPITAL_COMMUNITY): Payer: Medicare Other

## 2016-07-18 ENCOUNTER — Emergency Department (HOSPITAL_COMMUNITY)
Admission: EM | Admit: 2016-07-18 | Discharge: 2016-07-18 | Disposition: A | Discharge status: Home / Self Care | Payer: Medicare Other | Attending: Emergency Medicine | Admitting: Emergency Medicine

## 2016-07-18 DIAGNOSIS — I10 Essential (primary) hypertension: Secondary | ICD-10-CM | POA: Insufficient documentation

## 2016-07-18 DIAGNOSIS — Z8673 Personal history of transient ischemic attack (TIA), and cerebral infarction without residual deficits: Secondary | ICD-10-CM | POA: Diagnosis not present

## 2016-07-18 DIAGNOSIS — M545 Low back pain: Secondary | ICD-10-CM | POA: Diagnosis not present

## 2016-07-18 DIAGNOSIS — Z79899 Other long term (current) drug therapy: Secondary | ICD-10-CM | POA: Insufficient documentation

## 2016-07-18 DIAGNOSIS — D5700 Hb-SS disease with crisis, unspecified: Secondary | ICD-10-CM | POA: Insufficient documentation

## 2016-07-18 DIAGNOSIS — R079 Chest pain, unspecified: Secondary | ICD-10-CM | POA: Diagnosis not present

## 2016-07-18 DIAGNOSIS — Z87891 Personal history of nicotine dependence: Secondary | ICD-10-CM | POA: Insufficient documentation

## 2016-07-18 DIAGNOSIS — M79604 Pain in right leg: Secondary | ICD-10-CM | POA: Diagnosis not present

## 2016-07-18 LAB — CBC WITH DIFFERENTIAL/PLATELET
BASOS ABS: 0.1 10*3/uL (ref 0.0–0.1)
BASOS PCT: 1 %
EOS ABS: 0.3 10*3/uL (ref 0.0–0.7)
EOS PCT: 3 %
HCT: 22.4 % — ABNORMAL LOW (ref 39.0–52.0)
Hemoglobin: 7.5 g/dL — ABNORMAL LOW (ref 13.0–17.0)
Lymphocytes Relative: 12 %
Lymphs Abs: 1.5 10*3/uL (ref 0.7–4.0)
MCH: 29.8 pg (ref 26.0–34.0)
MCHC: 33.5 g/dL (ref 30.0–36.0)
MCV: 88.9 fL (ref 78.0–100.0)
Monocytes Absolute: 2.3 10*3/uL — ABNORMAL HIGH (ref 0.1–1.0)
Monocytes Relative: 18 %
Neutro Abs: 8.3 10*3/uL — ABNORMAL HIGH (ref 1.7–7.7)
Neutrophils Relative %: 66 %
PLATELETS: 379 10*3/uL (ref 150–400)
RBC: 2.52 MIL/uL — ABNORMAL LOW (ref 4.22–5.81)
RDW: 17.9 % — AB (ref 11.5–15.5)
WBC: 12.5 10*3/uL — ABNORMAL HIGH (ref 4.0–10.5)

## 2016-07-18 LAB — HEMOGLOBINOPATHY EVALUATION
HGB A2 QUANT: 2.9 % (ref 1.8–3.2)
HGB A: 81.7 % — AB (ref 96.4–98.8)
HGB F QUANT: 1.2 % (ref 0.0–2.0)
HGB S QUANTITAION: 14.2 % — AB
Hgb C: 0 %
Hgb Variant: 0 %

## 2016-07-18 LAB — RETICULOCYTES
RBC.: 2.52 MIL/uL — AB (ref 4.22–5.81)
RETIC COUNT ABSOLUTE: 247 10*3/uL — AB (ref 19.0–186.0)
Retic Ct Pct: 9.8 % — ABNORMAL HIGH (ref 0.4–3.1)

## 2016-07-18 LAB — COMPREHENSIVE METABOLIC PANEL
ALBUMIN: 4 g/dL (ref 3.5–5.0)
ALK PHOS: 102 U/L (ref 38–126)
ALT: 33 U/L (ref 17–63)
ANION GAP: 4 — AB (ref 5–15)
AST: 41 U/L (ref 15–41)
BILIRUBIN TOTAL: 4.8 mg/dL — AB (ref 0.3–1.2)
BUN: 8 mg/dL (ref 6–20)
CALCIUM: 8.5 mg/dL — AB (ref 8.9–10.3)
CO2: 27 mmol/L (ref 22–32)
Chloride: 108 mmol/L (ref 101–111)
Creatinine, Ser: 0.77 mg/dL (ref 0.61–1.24)
GFR calc Af Amer: >60 mL/min (ref >60)
GFR calc non Af Amer: >60 mL/min (ref >60)
Glucose, Bld: 122 mg/dL — ABNORMAL HIGH (ref 65–99)
Potassium: 3.7 mmol/L (ref 3.5–5.1)
Sodium: 139 mmol/L (ref 135–145)
TOTAL PROTEIN: 7.1 g/dL (ref 6.5–8.1)

## 2016-07-18 IMAGING — CR DG CHEST 2V
2 series · 2 of 2 positions shown · non-contrast
Comparison: 08/13/2013 and 07/29/2013

CLINICAL DATA: Sickle cell crisis.  Shortness of breath.

EXAM:
CHEST  2 VIEW

[w chest pa]
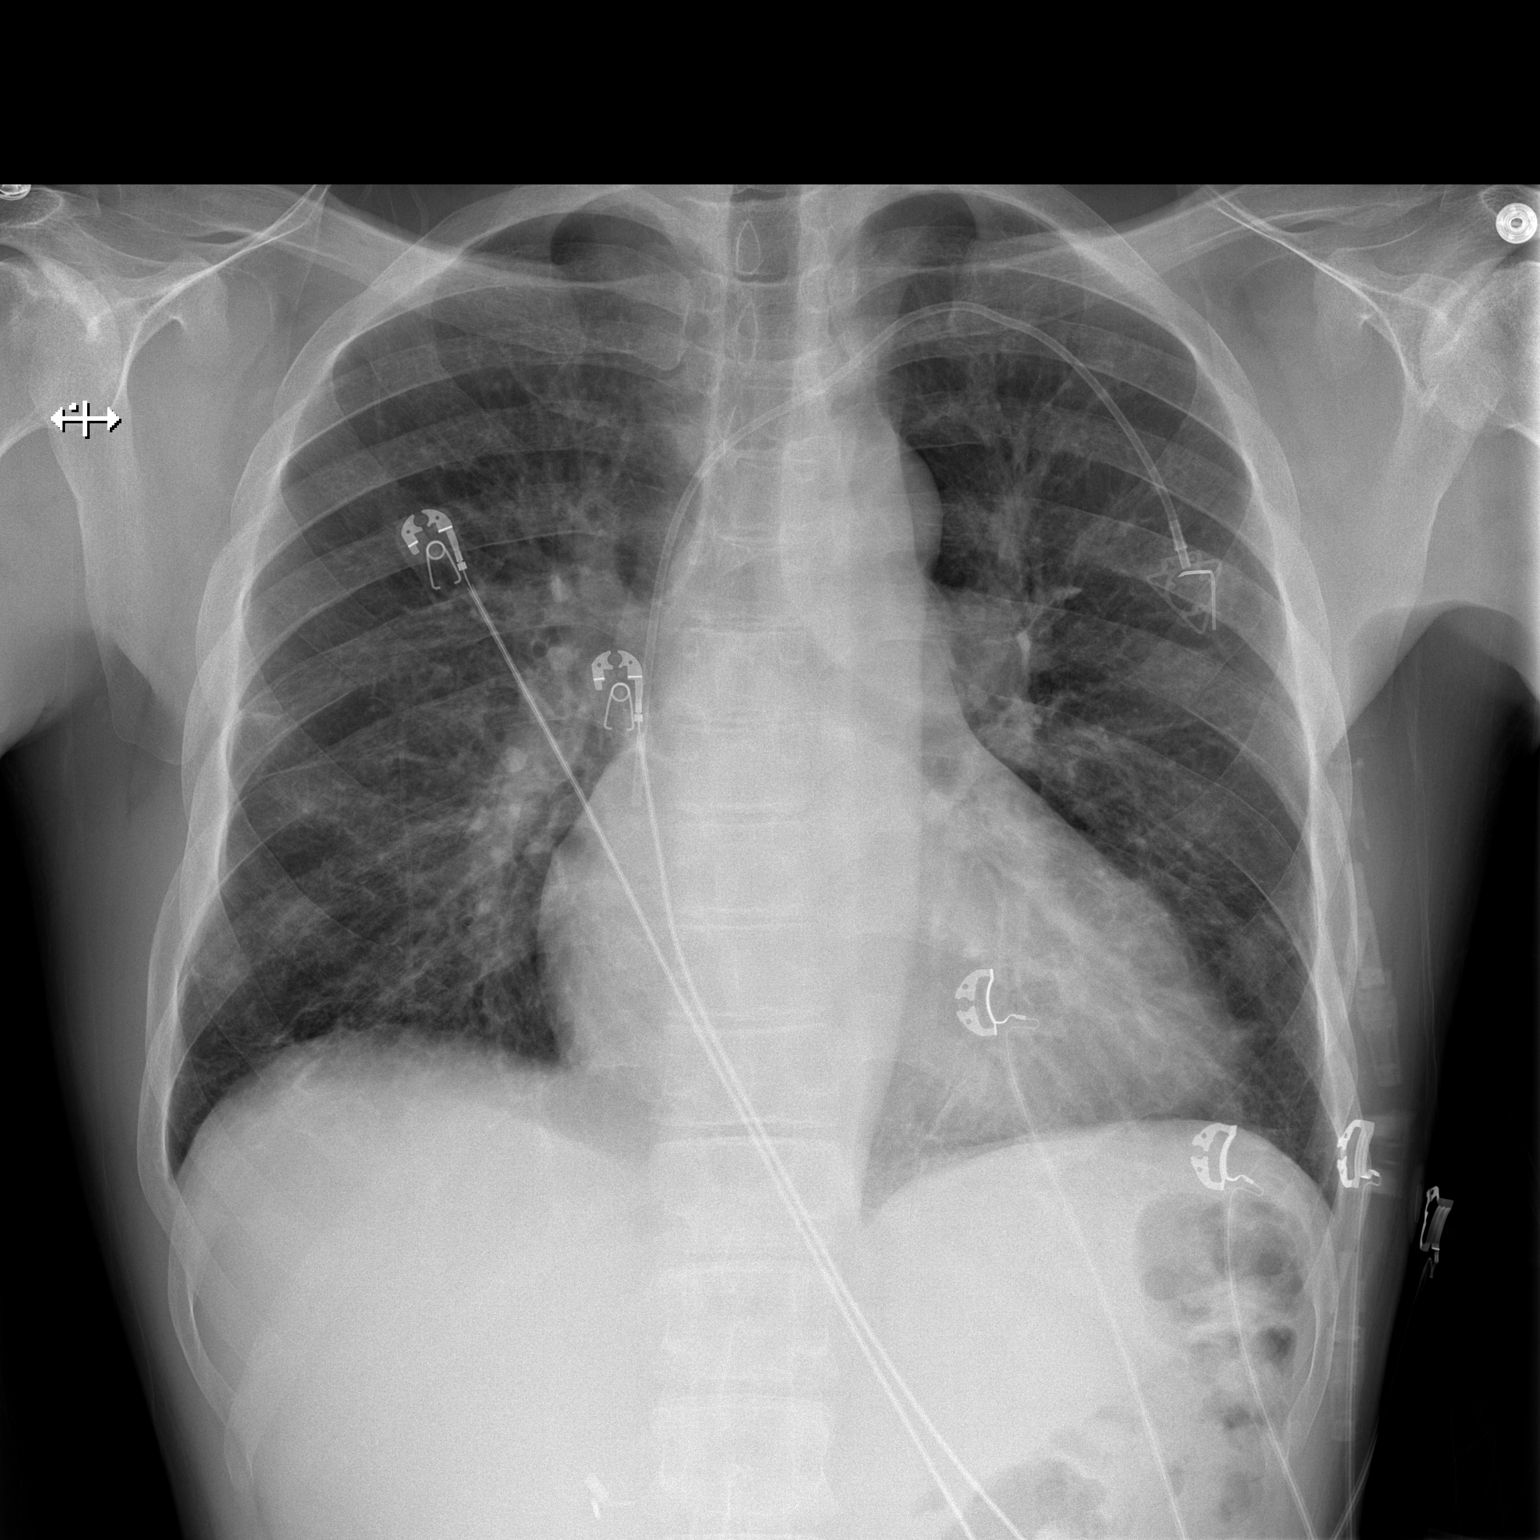

[w chest lat]
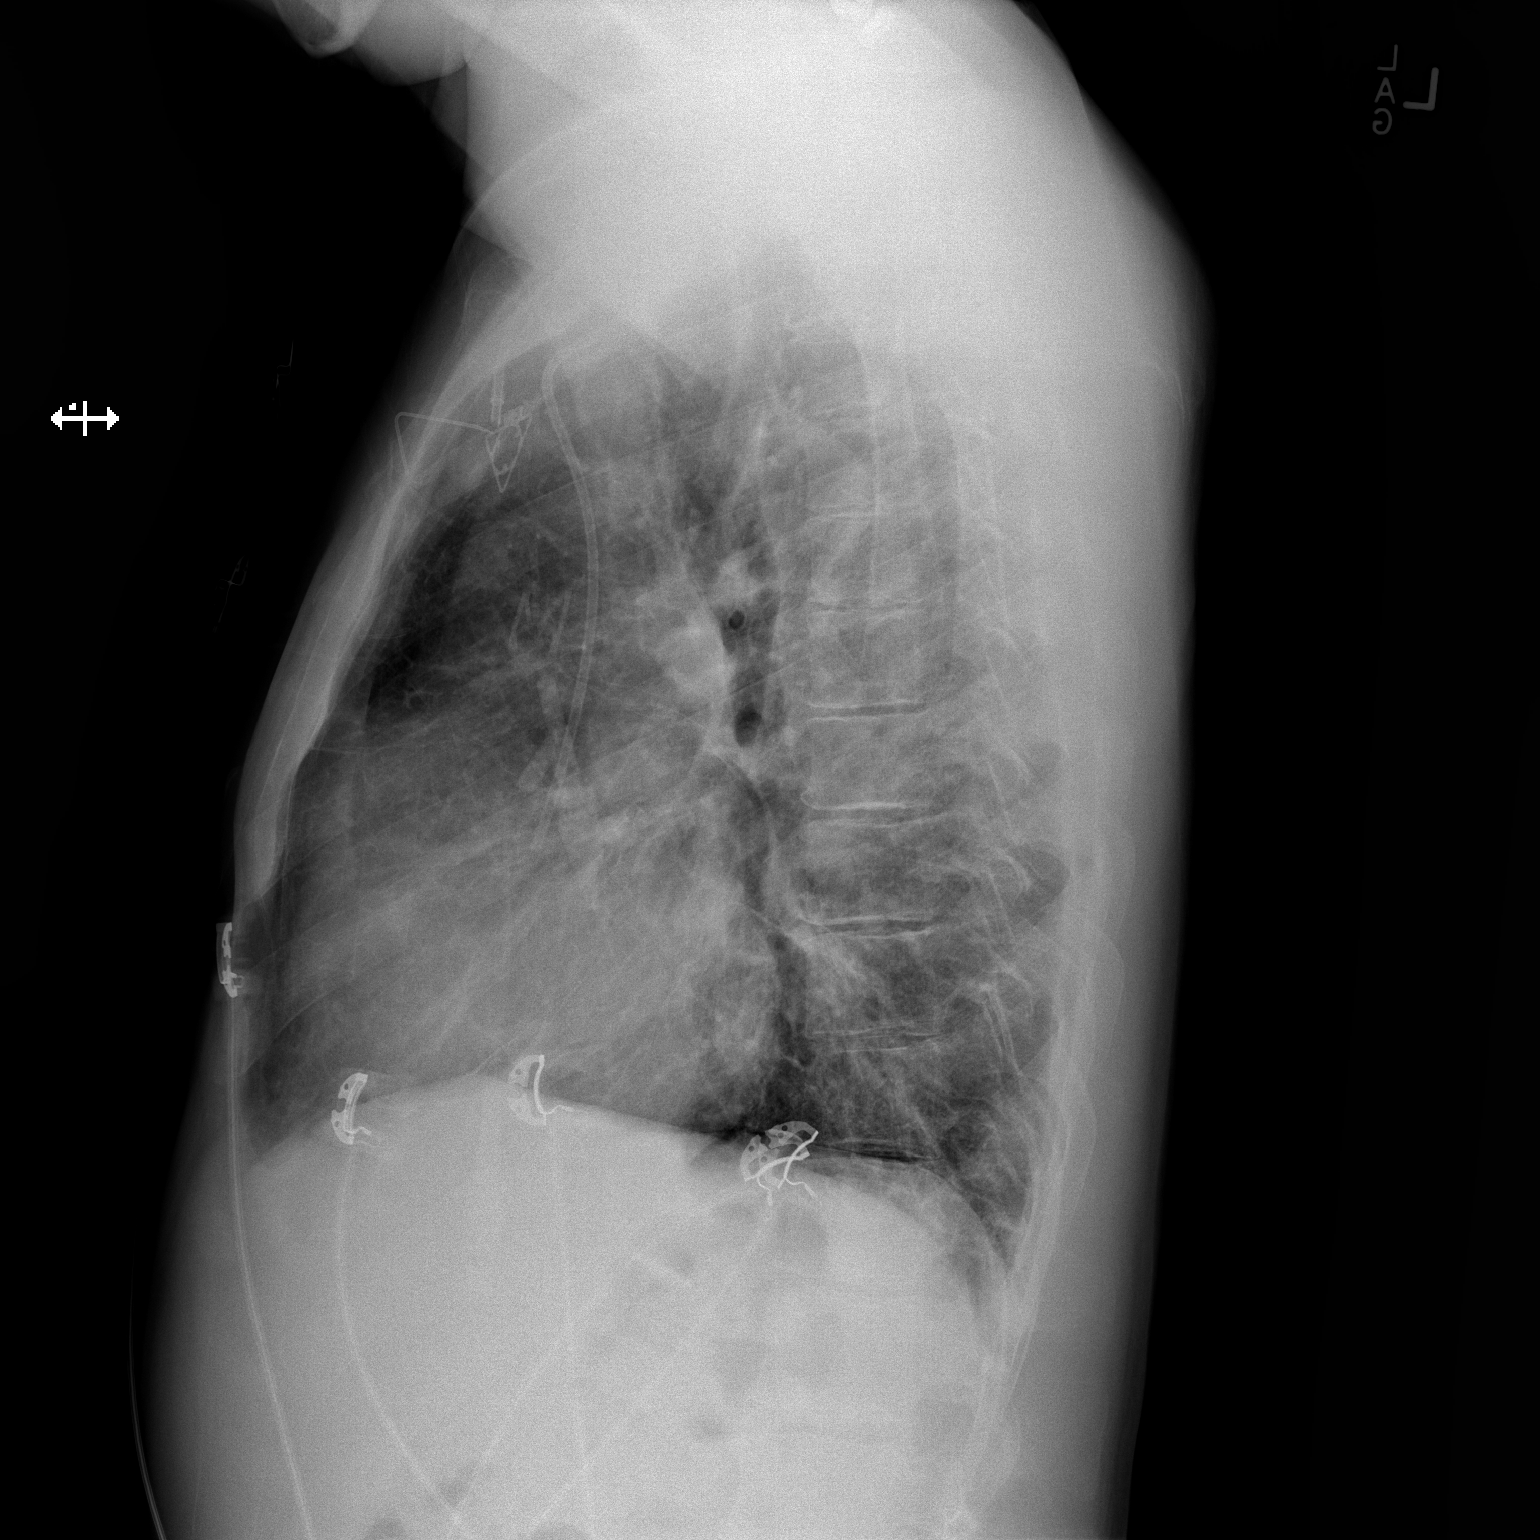

[2 of 2 positions shown; findings below may reference images not displayed]

FINDINGS: Power port in place. Chronic cardiomegaly. Pulmonary vascularity is
within normal limits. There is slight chronic accentuation of the
interstitial markings. No effusions. No acute osseous abnormality.
IMPRESSION: No acute findings. Chronic cardiomegaly. Chronic slight accentuation
of the interstitial markings.

## 2016-07-18 MED ORDER — KETOROLAC TROMETHAMINE 30 MG/ML IJ SOLN
30.0000 mg | INTRAMUSCULAR | Status: AC
Start: 2016-07-18 — End: 2016-07-18
  Administered 2016-07-18: 30 mg via INTRAVENOUS
  Filled 2016-07-18: qty 1

## 2016-07-18 MED ORDER — IOPAMIDOL (ISOVUE-370) INJECTION 76%
INTRAVENOUS | Status: AC
  Filled 2016-07-18: qty 100

## 2016-07-18 MED ORDER — ONDANSETRON HCL 4 MG/2ML IJ SOLN
4.0000 mg | INTRAMUSCULAR | Status: DC | PRN
  Administered 2016-07-18: 4 mg via INTRAVENOUS
  Filled 2016-07-18: qty 2

## 2016-07-18 MED ORDER — HYDROMORPHONE HCL 1 MG/ML IJ SOLN
2.0000 mg | Freq: Once | INTRAMUSCULAR | Status: AC
  Administered 2016-07-18: 2 mg via INTRAVENOUS
  Filled 2016-07-18: qty 2

## 2016-07-18 MED ORDER — DEXTROSE-NACL 5-0.45 % IV SOLN
INTRAVENOUS | Status: DC
  Administered 2016-07-18: 13:00:00 via INTRAVENOUS

## 2016-07-18 MED ORDER — IOPAMIDOL (ISOVUE-370) INJECTION 76%: 100.0000 mL | Freq: Once | INTRAVENOUS | Status: DC | PRN

## 2016-07-18 MED ORDER — HEPARIN SOD (PORK) LOCK FLUSH 100 UNIT/ML IV SOLN
500.0000 [IU] | Freq: Once | INTRAVENOUS | Status: AC
  Administered 2016-07-18: 500 [IU]
  Filled 2016-07-18: qty 5

## 2016-07-18 MED ORDER — DIPHENHYDRAMINE HCL 50 MG/ML IJ SOLN
25.0000 mg | Freq: Once | INTRAMUSCULAR | Status: AC
  Administered 2016-07-18: 25 mg via INTRAVENOUS
  Filled 2016-07-18: qty 1

## 2016-07-18 NOTE — ED Notes (Signed)
MD placed patient on 2 L [place]

## 2016-07-18 NOTE — ED Notes (Signed)
Patient transported to X-ray 

## 2016-07-18 NOTE — ED Triage Notes (Signed)
Pt presents w/ bilateral leg, back and rib cage pain r/t SS. Pt reports taking x2 4mg  Dilaudid and x1 30mg  Oxycotin tablets at home w/o relief. Pt denies sob and dyspnea. Pt A+OX4, speaking in complete sentences, ambulatory to triage.

## 2016-07-18 NOTE — ED Notes (Addendum)
Placed pt on 3 lpm of O2 as pt was saturating at 86% on RA O2 improved to 94%

## 2016-07-19 IMAGING — CT CT ANGIO CHEST
1 of 2 series · 19 of 32 positions shown · IV contrast ([ID] OMNI 350)
Comparison: 06/17/2013

CLINICAL DATA: Bilateral rib pain, worse on the left. History of
sickle cell. White cell count 16.6.

EXAM:
CT ANGIOGRAPHY CHEST WITH CONTRAST
TECHNIQUE: Multidetector CT imaging of the chest was performed using the
standard protocol during bolus administration of intravenous
contrast. Multiplanar CT image reconstructions and MIPs were
obtained to evaluate the vascular anatomy.
CONTRAST:  100mL OMNIPAQUE IOHEXOL 350 MG/ML SOLN

[Series 5: thins for pacs · axial · 0.71mm/px · z∈[+152,+370]mm · 19 of 244 slices shown]
[im 13/244  lung]
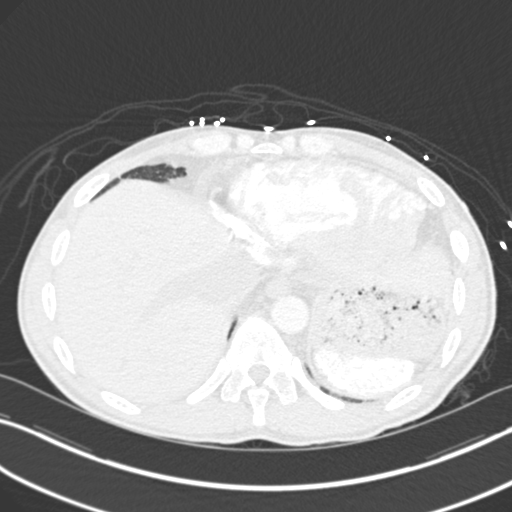
[im 25/244  mediastinal]
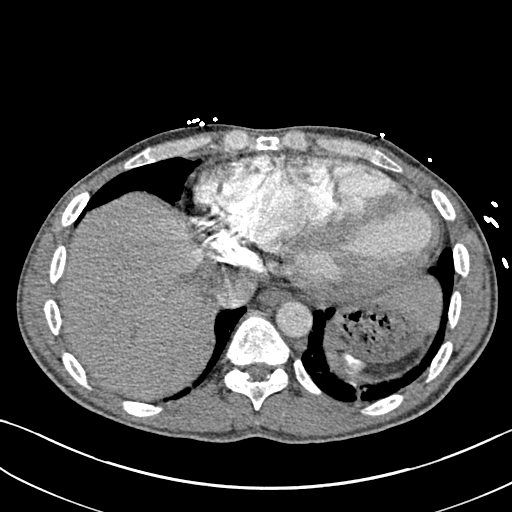
[im 37/244  lung]
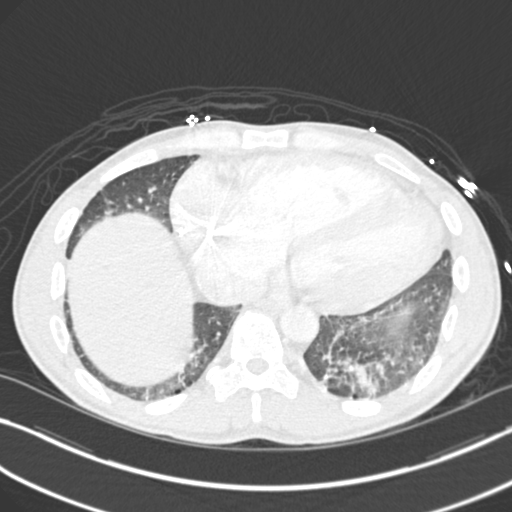
[im 61/244  mediastinal]
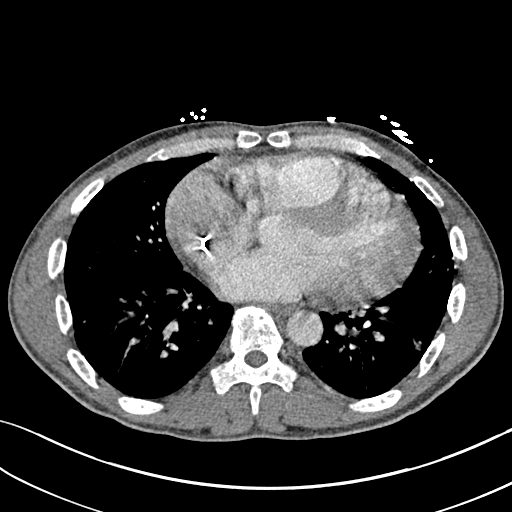
[im 73/244  lung]
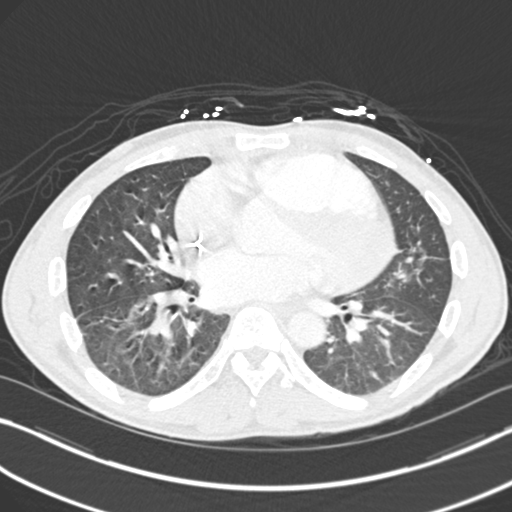
[im 82/244  mediastinal]
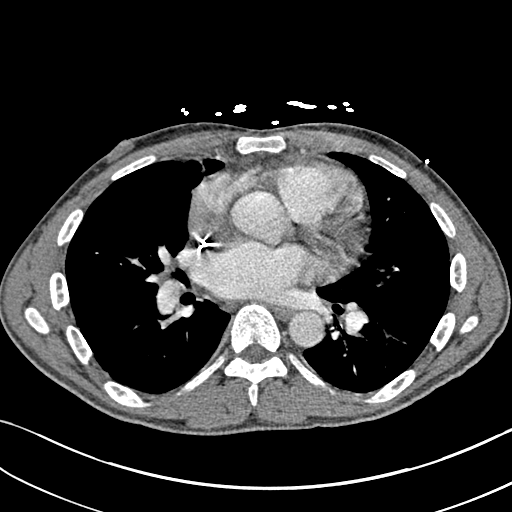
[im 86/244  lung]
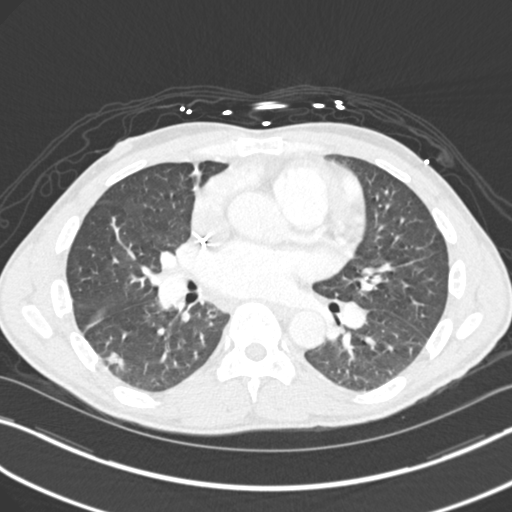
[im 98/244  mediastinal]
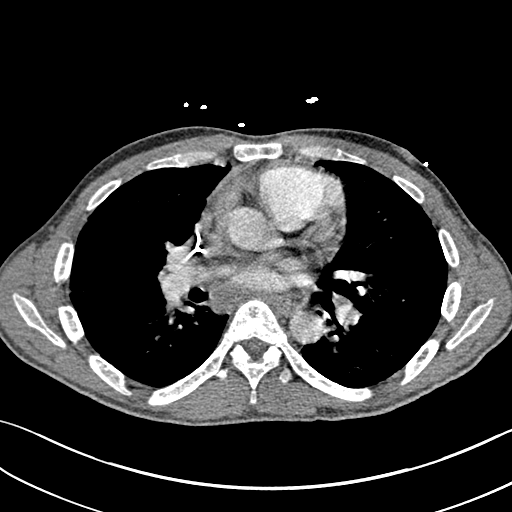
[im 110/244  lung]
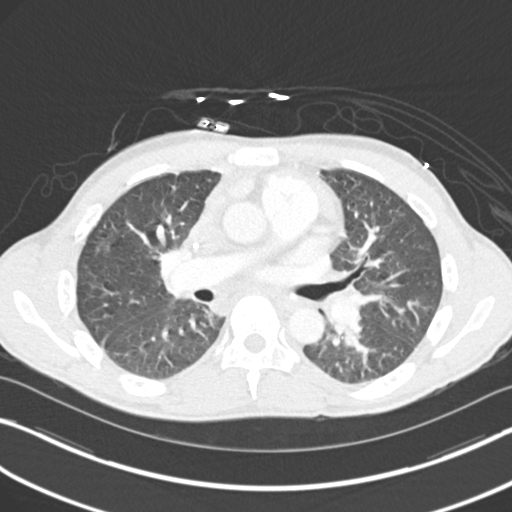
[im 122/244  mediastinal]
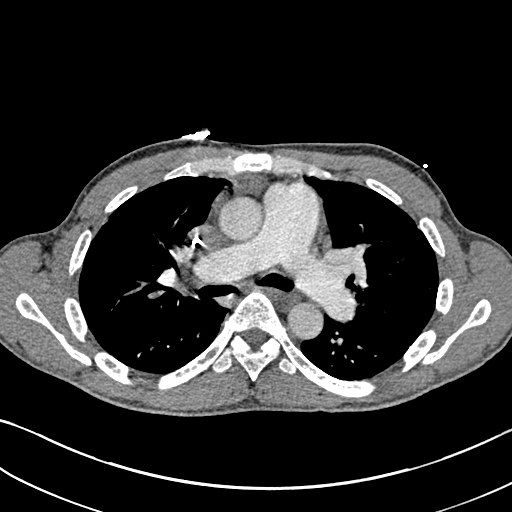
[im 134/244  lung]
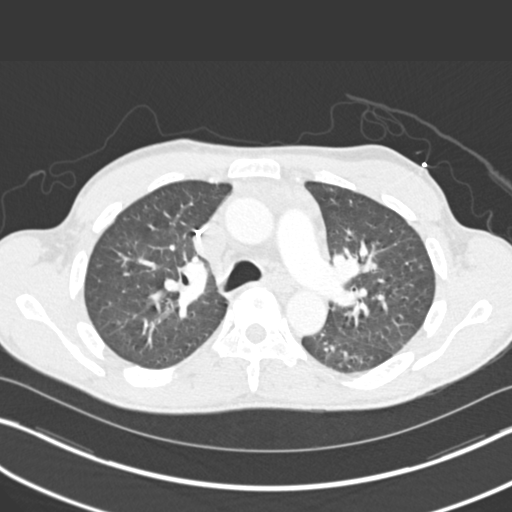
[im 146/244  mediastinal]
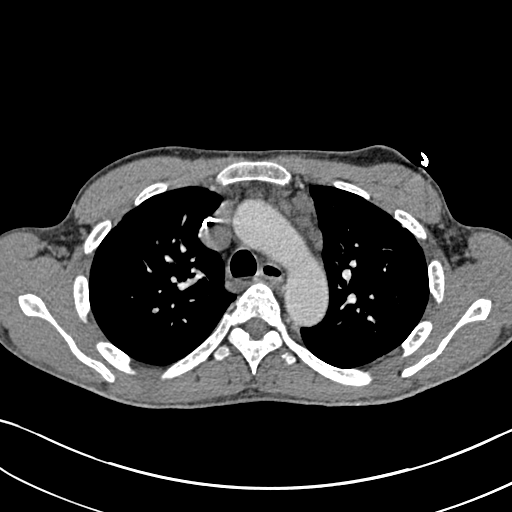
[im 158/244  lung]
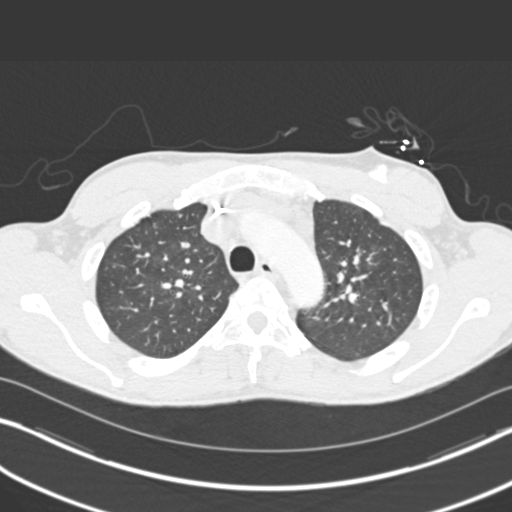
[im 163/244  mediastinal]
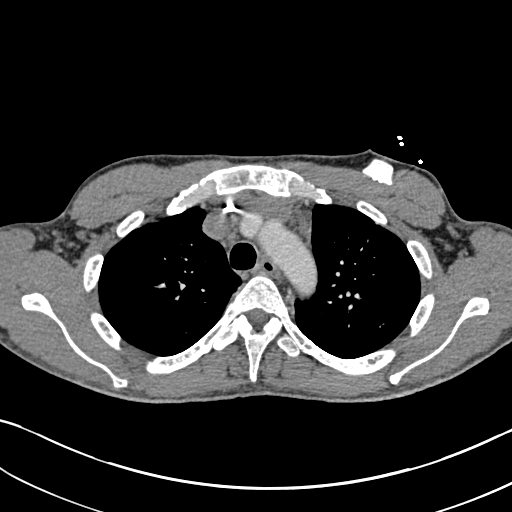
[im 171/244  lung]
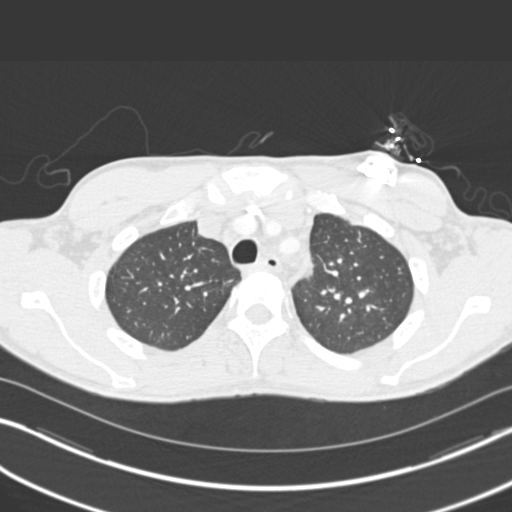
[im 183/244  mediastinal]
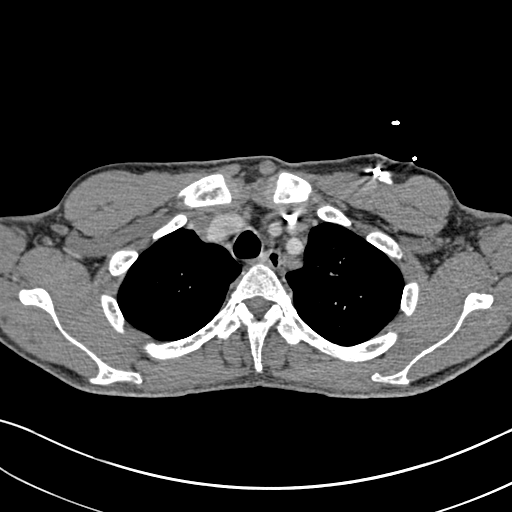
[im 207/244  lung]
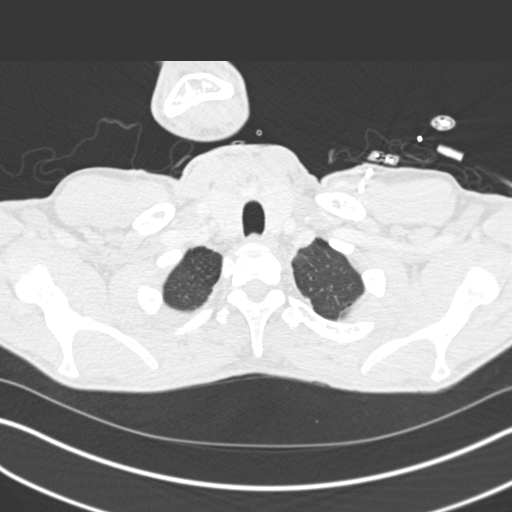
[im 219/244  mediastinal]
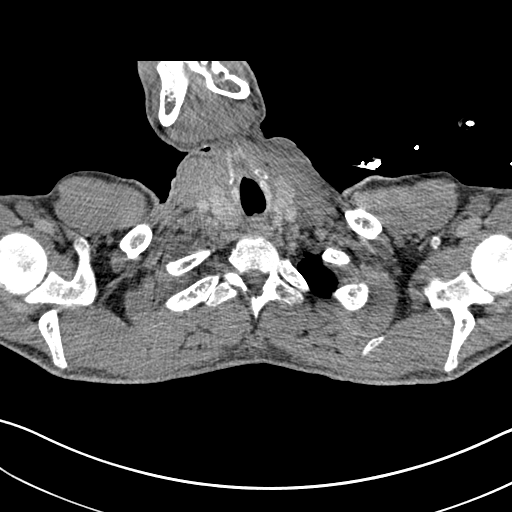
[im 231/244  lung]
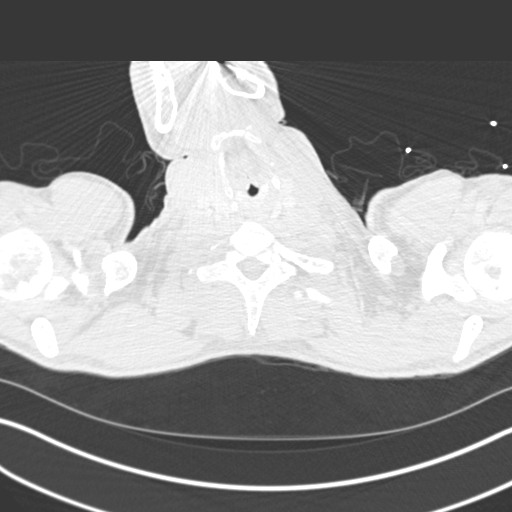

[19 of 32 positions shown; findings below may reference images not displayed]

FINDINGS: Left central venous catheter remains unchanged in position.
Technically adequate study with moderately good opacification of
central and proximal segmental pulmonary arteries. Distal segmental
vessels are not well demonstrated due to motion artifact. There is
no focal filling defect in the visualized pulmonary arteries. No
significant pulmonary embolus is suggested.

Cardiac enlargement. Normal caliber thoracic aorta. Moderately
prominent lymph nodes in the anterior mediastinum without change
since prior study. Esophagus is decompressed. Evaluation of lungs is
limited due to respiratory motion artifact but there appears to be
patchy infiltration in the lung periphery bilaterally, more
prominent in the bases. Scattered nodular changes are demonstrated
similar previous study. Infiltrative changes are some of the prior
study and may represent infiltration due to pneumonia, atelectasis,
or acute chest syndrome. No pleural effusion or pneumothorax.
Diffusely increased density and atrophy of the spleen consistent
with sickle cell related changes. Thoracic vertebral changes
consistent with sickle cell.

Review of the MIP images confirms the above findings.
IMPRESSION: No evidence of significant pulmonary embolus. Evaluation of lungs is
limited due to respiratory motion artifact but there appears to be
patchy peripheral infiltrative changes bilaterally, similar to prior
study. Cardiac enlargement and prominent anterior mediastinal lymph
nodes are also unchanged. Changes consistent with sickle cell effect
in the spleen and spine.

## 2016-07-19 NOTE — ED Provider Notes (Signed)
[facility] DEPT Provider Note   CSN: [id number] Arrival date & time: 07/18/16  1022     History   Chief Complaint Chief Complaint  Patient presents with  . Sickle Cell Pain Crisis    HPI [name] is a 36 y.o. male.  HPI Patient presents emergency department with complaints of sickle cell related pain in his back and legs.  He denies chest pain and denies shortness of breath.  Initially he is not on oxygen and found to be 83% and only after his workup in the emergency department that he let me know the specimen attempt 3 L nasal cannula.  He reports taking his home pain medication without improvement in his symptoms.  He denies fevers.  Denies chills.  Denies productive cough.  No unilateral leg swelling.  He does have a history of acute chest syndrome as well as a history of pulmonary embolism.  He states is compliant with his anticoagulation.   Past Medical History:  Diagnosis Date  . Acute chest syndrome ([place]) 06/18/2013  . Acute embolism and thrombosis of right internal jugular vein ([place])   . Alcohol consumption of one to four drinks per day   . Avascular necrosis (HCC)    Right Hip  . Blood transfusion   . Chronic anticoagulation   . Demand ischemia ([place]) 01/02/2014  . Former smoker   . Functional asplenia   . Hb-SS disease with crisis ([place])   . History of Clostridium difficile infection   . History of pulmonary embolus (PE)   . Hypertension   . Hypokalemia   . Leukocytosis    Chronic  . Mood disorder ([place])   . Noncompliance with medication regimen   . Oxygen deficiency   . Pulmonary hypertension ([place])   . Second hand tobacco smoke exposure   . Sickle cell anemia (HCC)   . Sickle-cell crisis with associated acute chest syndrome ([place]) 05/13/2013  . Stroke ([place])   . Thrombocytosis (HCC)    Chronic  . Uses marijuana     Patient Active Problem List   Diagnosis Date Noted  . Sickle cell disease with crisis ([place]) 06/25/2016  . Sickle cell crisis ([place])  02/23/2016  . H/O arterial ischemic stroke 11/11/2015  . Slurred speech 11/10/2015  . Chronic anemia   . Hb-SS disease without crisis ([place]) 10/07/2014  . Prolonged Q-T interval on ECG 09/10/2014  . Troponin level elevated 08/29/2014  . Cor pulmonale, chronic ([place]) 08/25/2014  . Sickle cell anemia ([place]) 06/25/2014  . Anemia of chronic disease 06/25/2014  . PAH (pulmonary artery hypertension) ([place]) 03/18/2014  . Chronic respiratory failure with hypoxia ([place]) 03/14/2014  . Paralytic strabismus, external ophthalmoplegia   . Chronic pain syndrome 12/12/2013  . Chronic anticoagulation 08/22/2013  . Essential hypertension 08/22/2013  . Pulmonary HTN ([place]) 06/18/2013  . Functional asplenia   . Vitamin D deficiency 02/13/2013  . Hx of pulmonary embolus 06/29/2012  . 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 Avascular necrosis [facility])     Past Surgical History:  Procedure Laterality Date  . CHOLECYSTECTOMY     01/2008  . Excision of left periauricular cyst     10/2009  . Excision of right ear lobe cyst with primary closur     11/2007  . Porta cath placement    . Porta cath removal    . PORTACATH PLACEMENT  01/05/2012   Procedure: INSERTION PORT-A-CATH;  Surgeon: Odis Hollingshead, MD;  Location: [facility];  Service: General;  Laterality: N/A;  ultrasound  guiced port a cath insertion with fluoroscopy  . Right hip replacement     08/2006  . UMBILICAL HERNIA REPAIR     01/2008       Home Medications    Prior to Admission medications   Medication Sig Start Date End Date Taking? Authorizing Provider  carvedilol (COREG) 3.125 MG tablet Take 1 tablet (3.125 mg total) by mouth 2 (two) times daily with a meal. 05/17/16  Yes [name], MD  cholecalciferol (VITAMIN D) 1000 units tablet Take 2,000 Units by mouth daily.   Yes [provider]  folic acid (FOLVITE) 1 MG tablet Take 2 tablets (2 mg total) by mouth daily. 04/25/16  Yes [name], MD  gabapentin (NEURONTIN)  300 MG capsule Take 1 capsule (300 mg total) by mouth 3 (three) times daily. 06/01/15  Yes Tresa Garter, MD  HYDROmorphone (DILAUDID) 4 MG tablet Take 1 tablet (4 mg total) by mouth every 4 (four) hours as needed for moderate pain or severe pain. 07/07/16 07/22/16 Yes Jegede, Marlena Clipper, MD  hydroxyurea (HYDREA) 500 MG capsule Take 500 mg by mouth every other day. May take with food to minimize GI side effects. 06/25/16  Yes [provider]  hydroxyurea (HYDREA) 500 MG capsule Take 1,000 mg by mouth every other day. May take with food to minimize GI side effects. 06/24/16  Yes [provider]  L-glutamine (ENDARI) 5 g PACK Powder Packet Take 15 g by mouth 2 (two) times daily. 04/25/16  Yes [name], MD  lisinopril (PRINIVIL,ZESTRIL) 2.5 MG tablet Take 2.5 mg by mouth at bedtime.  05/22/16  Yes [provider]  morphine (MS CONTIN) 30 MG 12 hr tablet Take 1 tablet (30 mg total) by mouth every 12 (twelve) hours. 07/09/16 08/08/16 Yes Scot Jun, FNP  potassium chloride SA (K-DUR,KLOR-CON) 20 MEQ tablet Take 2 tablets (40 mEq total) by mouth daily. 04/27/16  Yes [name], MD  rivaroxaban (XARELTO) 20 MG TABS tablet Take 1 tablet (20 mg total) by mouth at bedtime. 06/23/16  Yes Dorena Dew, FNP  torsemide (DEMADEX) 20 MG tablet Take 1 tablet (20 mg total) by mouth daily as needed (leg swelling). 06/13/16  Yes [name], MD  zolpidem (AMBIEN) 10 MG tablet Take 1 tablet (10 mg total) by mouth at bedtime. 07/05/16 08/04/16 Yes Scot Jun, FNP    Family History Family History  Problem Relation Age of Onset  . Sickle cell trait Mother   . Depression Mother   . Diabetes Mother   . Sickle cell trait Father   . Sickle cell trait Brother     Social History Social History  Substance Use Topics  . Smoking status: Former Smoker    Packs/day: 0.50    Years: 10.00    Types: Cigarettes    Quit date: 05/29/2011  . Smokeless tobacco:  Never Used  . Alcohol use No     Allergies   Patient has no known allergies.   Review of Systems Review of Systems  All other systems reviewed and are negative.    Physical Exam Updated Vital Signs BP 102/76 (BP Location: Right Arm)   Pulse 72   Temp 98.7 F (37.1 C) (Oral)   Resp 16   Ht 6' (1.829 m)   Wt 88.9 kg (196 lb)   SpO2 94%   BMI 26.58 kg/m   Physical Exam  Constitutional: He is oriented to person, place, and time. He appears well-developed  and well-nourished.  HENT:  Head: Normocephalic and atraumatic.  Eyes: EOM are normal.  Neck: Normal range of motion.  Cardiovascular: Normal rate, regular rhythm, normal heart sounds and intact distal pulses.   Pulmonary/Chest: Effort normal and breath sounds normal. No respiratory distress.  Abdominal: Soft. He exhibits no distension. There is no tenderness.  Musculoskeletal: Normal range of motion.  Neurological: He is alert and oriented to person, place, and time.  Skin: Skin is warm and dry.  Psychiatric: He has a normal mood and affect. Judgment normal.  Nursing note and vitals reviewed.    ED Treatments / Results  Labs (all labs ordered are listed, but only abnormal results are displayed) Labs Reviewed  COMPREHENSIVE METABOLIC PANEL - Abnormal; Notable for the following:       Result Value   Glucose, Bld 122 (*)    Calcium 8.5 (*)    Total Bilirubin 4.8 (*)    Anion gap 4 (*)    All other components within normal limits  CBC WITH DIFFERENTIAL/PLATELET - Abnormal; Notable for the following:    WBC 12.5 (*)    RBC 2.52 (*)    Hemoglobin 7.5 (*)    HCT 22.4 (*)    RDW 17.9 (*)    Neutro Abs 8.3 (*)    Monocytes Absolute 2.3 (*)    All other components within normal limits  RETICULOCYTES - Abnormal; Notable for the following:    Retic Ct Pct 9.8 (*)    RBC. 2.52 (*)    Retic Count, Manual 247.0 (*)    All other components within normal limits    EKG  EKG Interpretation None        Radiology Dg Chest 2 View  Result Date: 07/18/2016 CLINICAL DATA:  Bilateral chest and back pain, somnolence. Former smoker. History of sickle cell anemia, pulmonary embolus. EXAM: CHEST  2 VIEW COMPARISON:  Portable chest x-ray of July 14, 2016 FINDINGS: The lungs are well-expanded. There is no focal infiltrate. No significant pleural effusion is visible. The cardiac silhouette remains enlarged. The pulmonary vascularity remains engorged. The pulmonary interstitial markings are mildly increased over baseline. The power port catheter tip projects over the cavoatrial junction. The mediastinum is normal in width. The bony thorax exhibits no acute abnormality. IMPRESSION: COPD and smoking related changes, stable. Mild pulmonary interstitial prominence bilaterally suggests low-grade CHF. Stable cardiomegaly. Electronically Signed   By: David  Martinique M.D.   On: 07/18/2016 13:05   Ct Angio Chest Pe W Or Wo Contrast  Addendum Date: 07/18/2016   ADDENDUM REPORT: 07/18/2016 19:21 ADDENDUM: Add to IMPRESSION: Suspect a degree of underlying pulmonary arterial hypertension given prominence in the main pulmonary outflow tract. Electronically Signed   By: Lowella Grip III M.D.   On: 07/18/2016 19:21   Result Date: 07/18/2016 CLINICAL DATA:  Chest pain.  History of sickle cell disease EXAM: CT ANGIOGRAPHY CHEST WITH CONTRAST TECHNIQUE: Multidetector CT imaging of the chest was performed using the standard protocol during bolus administration of intravenous contrast. Multiplanar CT image reconstructions and MIPs were obtained to evaluate the vascular anatomy. CONTRAST:  90 mL Isovue 370 nonionic COMPARISON:  Chest radiograph July 18, 2016; chest CT May 06, 2016 FINDINGS: Cardiovascular: There is a pulmonary embolus in a posterior segment right lower lobe pulmonary artery branch, similar to prior study. No more central pulmonary embolus evident. There is not felt to be of right heart strain. Heart is diffusely  enlarged. There is a small pericardial effusion. There is no thoracic  aortic aneurysm or dissection. Port-A-Cath tip is at the cavoatrial junction. Main pulmonary outflow tract measures 3.8 cm in diameter consistent with pulmonary arterial hypertension. Mediastinum/Nodes: Visualized thyroid appears normal. There are multiple mediastinal lymph nodes which appear similar to the previous study. There is not felt to be frank adenopathy by size criteria. Lungs/Pleura: There is bibasilar atelectasis. There is interstitial pulmonary edema. There is a stable nodular opacity in the right middle lobe seen on axial slice 51 series 11 measuring 3 mm. There is a stable nodular opacity in the lateral segment of the right middle lobe measuring 6 x 6 mm, ground-glass in appearance, best seen on axial slice 51 series 11. There is no appreciable pleural effusion or pleural thickening. Upper Abdomen: Spleen is small and calcified consistent with prior infarct from sickle cell disease. There is reflux of contrast into the inferior vena cava and hepatic veins. Visualized upper abdominal structures otherwise appear unremarkable. Musculoskeletal: There are endplate infarcts and multiple thoracic spine levels. Multiple areas of sclerosis noted consistent with sickle cell disease. No appreciable extramedullary hematopoiesis noted. Review of the MIP images confirms the above findings. IMPRESSION: Small pulmonary embolus in a right lower lobe pulmonary artery branch, also present on most recent study. Suspect small chronic pulmonary embolus focus. No new pulmonary embolus compared to most recent study. Findings felt to be indicative of a degree of congestive heart failure. Reflux of contrast into the hepatic veins and inferior vena cava may indicate increase in right heart pressure. Nodular opacities, largest measuring 6 x 6 mm, ground-glass in appearance. This nodular opacity noted in right middle lobe. Initial follow-up with CT at 6-12  months is recommended to confirm persistence. If persistent, repeat CT is recommended every 2 years until 5 years of stability has been established. This recommendation follows the consensus statement: Guidelines for Management of Incidental Pulmonary Nodules Detected on CT Images: From the Fleischner Society 2017; Radiology 2017; 284:228-243. Multiple small lymph nodes, stable. Bony changes and splenic appearance consistent with known sickle cell disease. Critical Value/emergent results were called by telephone at the time of interpretation on 07/18/2016 at 4:18 pm to Dr. Jola Schmidt , who verbally acknowledged these results. Electronically Signed: By: Lowella Grip III M.D. On: 07/18/2016 16:19    Procedures Procedures (including critical care time)  Medications Ordered in ED Medications  ketorolac (TORADOL) 30 MG/ML injection 30 mg (30 mg Intravenous Given 07/18/16 1316)  HYDROmorphone (DILAUDID) injection 2 mg (2 mg Intravenous Given 07/18/16 1316)  diphenhydrAMINE (BENADRYL) injection 25 mg (25 mg Intravenous Given 07/18/16 1351)  HYDROmorphone (DILAUDID) injection 2 mg (2 mg Intravenous Given 07/18/16 1519)  heparin lock flush 100 unit/mL (500 Units Intracatheter Given 07/18/16 1708)     Initial Impression / Assessment and Plan / ED Course  I have reviewed the triage vital signs and the nursing notes.  Pertinent labs & imaging results that were available during my care of the patient were reviewed by me and considered in my medical decision making (see chart for details).     Chronic PE.  No new acute PE.  Pain improved.  Once patient was placed on his home 3 L of oxygen he no longer was hypoxic.  Discharge home in good condition.  Primary care follow-up  Final Clinical Impressions(s) / ED Diagnoses   Final diagnoses:  Sickle cell pain crisis [facility])    New Prescriptions Discharge Medication List as of 07/18/2016  4:43 PM       Jola Schmidt, MD 07/19/16 [phone]

## 2016-07-20 ENCOUNTER — Emergency Department (HOSPITAL_COMMUNITY): Payer: Medicare Other

## 2016-07-20 ENCOUNTER — Telehealth (HOSPITAL_COMMUNITY): Payer: Self-pay | Admitting: Hematology

## 2016-07-20 ENCOUNTER — Inpatient Hospital Stay (HOSPITAL_COMMUNITY)
Admission: EM | Admit: 2016-07-20 | Discharge: 2016-07-21 | DRG: 812 | Disposition: A | Discharge status: Home / Self Care | Payer: Medicare Other | Attending: Internal Medicine | Admitting: Internal Medicine

## 2016-07-20 ENCOUNTER — Encounter (HOSPITAL_COMMUNITY): Payer: Self-pay | Admitting: Emergency Medicine

## 2016-07-20 ENCOUNTER — Ambulatory Visit: Payer: Self-pay | Admitting: Family Medicine

## 2016-07-20 DIAGNOSIS — D571 Sickle-cell disease without crisis: Secondary | ICD-10-CM | POA: Diagnosis present

## 2016-07-20 DIAGNOSIS — J9611 Chronic respiratory failure with hypoxia: Secondary | ICD-10-CM | POA: Diagnosis present

## 2016-07-20 DIAGNOSIS — Z8673 Personal history of transient ischemic attack (TIA), and cerebral infarction without residual deficits: Secondary | ICD-10-CM

## 2016-07-20 DIAGNOSIS — I2781 Cor pulmonale (chronic): Secondary | ICD-10-CM | POA: Diagnosis present

## 2016-07-20 DIAGNOSIS — Z9049 Acquired absence of other specified parts of digestive tract: Secondary | ICD-10-CM | POA: Diagnosis not present

## 2016-07-20 DIAGNOSIS — Z87891 Personal history of nicotine dependence: Secondary | ICD-10-CM

## 2016-07-20 DIAGNOSIS — I2721 Secondary pulmonary arterial hypertension: Secondary | ICD-10-CM | POA: Diagnosis not present

## 2016-07-20 DIAGNOSIS — Z818 Family history of other mental and behavioral disorders: Secondary | ICD-10-CM | POA: Diagnosis not present

## 2016-07-20 DIAGNOSIS — G894 Chronic pain syndrome: Secondary | ICD-10-CM | POA: Diagnosis not present

## 2016-07-20 DIAGNOSIS — Z9981 Dependence on supplemental oxygen: Secondary | ICD-10-CM

## 2016-07-20 DIAGNOSIS — I10 Essential (primary) hypertension: Secondary | ICD-10-CM | POA: Diagnosis present

## 2016-07-20 DIAGNOSIS — D5700 Hb-SS disease with crisis, unspecified: Secondary | ICD-10-CM | POA: Diagnosis not present

## 2016-07-20 DIAGNOSIS — I2782 Chronic pulmonary embolism: Secondary | ICD-10-CM | POA: Diagnosis present

## 2016-07-20 DIAGNOSIS — Z96641 Presence of right artificial hip joint: Secondary | ICD-10-CM | POA: Diagnosis present

## 2016-07-20 DIAGNOSIS — Z79891 Long term (current) use of opiate analgesic: Secondary | ICD-10-CM

## 2016-07-20 DIAGNOSIS — Z7901 Long term (current) use of anticoagulants: Secondary | ICD-10-CM | POA: Diagnosis not present

## 2016-07-20 DIAGNOSIS — Z833 Family history of diabetes mellitus: Secondary | ICD-10-CM

## 2016-07-20 DIAGNOSIS — Z832 Family history of diseases of the blood and blood-forming organs and certain disorders involving the immune mechanism: Secondary | ICD-10-CM | POA: Diagnosis not present

## 2016-07-20 DIAGNOSIS — E559 Vitamin D deficiency, unspecified: Secondary | ICD-10-CM | POA: Diagnosis present

## 2016-07-20 DIAGNOSIS — I429 Cardiomyopathy, unspecified: Secondary | ICD-10-CM | POA: Diagnosis present

## 2016-07-20 DIAGNOSIS — D638 Anemia in other chronic diseases classified elsewhere: Secondary | ICD-10-CM | POA: Diagnosis present

## 2016-07-20 DIAGNOSIS — R52 Pain, unspecified: Secondary | ICD-10-CM | POA: Diagnosis present

## 2016-07-20 DIAGNOSIS — E876 Hypokalemia: Secondary | ICD-10-CM | POA: Diagnosis present

## 2016-07-20 LAB — CBC WITH DIFFERENTIAL/PLATELET
Basophils Absolute: 0.1 10*3/uL (ref 0.0–0.1)
Basophils Relative: 1 %
EOS PCT: 1 %
Eosinophils Absolute: 0.1 10*3/uL (ref 0.0–0.7)
HEMATOCRIT: 25.1 % — AB (ref 39.0–52.0)
Hemoglobin: 8.5 g/dL — ABNORMAL LOW (ref 13.0–17.0)
LYMPHS ABS: 1.9 10*3/uL (ref 0.7–4.0)
LYMPHS PCT: 13 %
MCH: 30.2 pg (ref 26.0–34.0)
MCHC: 33.9 g/dL (ref 30.0–36.0)
MCV: 89.3 fL (ref 78.0–100.0)
Monocytes Absolute: 1.8 10*3/uL — ABNORMAL HIGH (ref 0.1–1.0)
Monocytes Relative: 12 %
NEUTROS ABS: 11.1 10*3/uL — AB (ref 1.7–7.7)
Neutrophils Relative %: 73 %
PLATELETS: 525 10*3/uL — AB (ref 150–400)
RBC: 2.81 MIL/uL — AB (ref 4.22–5.81)
RDW: 18.5 % — ABNORMAL HIGH (ref 11.5–15.5)
WBC: 15 10*3/uL — AB (ref 4.0–10.5)

## 2016-07-20 LAB — COMPREHENSIVE METABOLIC PANEL
ALBUMIN: 4.1 g/dL (ref 3.5–5.0)
ALT: 39 U/L (ref 17–63)
AST: 45 U/L — AB (ref 15–41)
Alkaline Phosphatase: 99 U/L (ref 38–126)
Anion gap: 10 (ref 5–15)
BUN: 6 mg/dL (ref 6–20)
CHLORIDE: 108 mmol/L (ref 101–111)
CO2: 21 mmol/L — ABNORMAL LOW (ref 22–32)
Calcium: 9 mg/dL (ref 8.9–10.3)
Creatinine, Ser: 0.68 mg/dL (ref 0.61–1.24)
GFR calc Af Amer: >60 mL/min (ref >60)
GFR calc non Af Amer: >60 mL/min (ref >60)
GLUCOSE: 107 mg/dL — AB (ref 65–99)
POTASSIUM: 3.2 mmol/L — AB (ref 3.5–5.1)
SODIUM: 139 mmol/L (ref 135–145)
Total Bilirubin: 5.6 mg/dL — ABNORMAL HIGH (ref 0.3–1.2)
Total Protein: 7.6 g/dL (ref 6.5–8.1)

## 2016-07-20 LAB — I-STAT TROPONIN, ED: TROPONIN I, POC: 0 ng/mL (ref 0.00–0.08)

## 2016-07-20 LAB — RETICULOCYTES
RBC.: 2.81 MIL/uL — ABNORMAL LOW (ref 4.22–5.81)
Retic Count, Absolute: 337.2 10*3/uL — ABNORMAL HIGH (ref 19.0–186.0)
Retic Ct Pct: 12 % — ABNORMAL HIGH (ref 0.4–3.1)

## 2016-07-20 LAB — MAGNESIUM: Magnesium: 1.7 mg/dL (ref 1.7–2.4)

## 2016-07-20 MED ORDER — HYDROMORPHONE HCL 1 MG/ML IJ SOLN: 2.0000 mg | INTRAMUSCULAR | Status: AC

## 2016-07-20 MED ORDER — GABAPENTIN 300 MG PO CAPS
300.0000 mg | ORAL_CAPSULE | Freq: Three times a day (TID) | ORAL | Status: DC
  Administered 2016-07-20 – 2016-07-21 (×3): 300 mg via ORAL
  Filled 2016-07-20 (×3): qty 1

## 2016-07-20 MED ORDER — HYDROXYUREA 500 MG PO CAPS
500.0000 mg | ORAL_CAPSULE | ORAL | Status: DC
  Administered 2016-07-21: 500 mg via ORAL
  Filled 2016-07-20: qty 1

## 2016-07-20 MED ORDER — CARVEDILOL 3.125 MG PO TABS
3.1250 mg | ORAL_TABLET | Freq: Two times a day (BID) | ORAL | Status: DC
  Administered 2016-07-21 (×2): 3.125 mg via ORAL
  Filled 2016-07-20 (×2): qty 1

## 2016-07-20 MED ORDER — L-GLUTAMINE ORAL POWDER
15.0000 g | PACK | Freq: Two times a day (BID) | ORAL | Status: DC
  Administered 2016-07-20 – 2016-07-21 (×2): 15 g via ORAL
  Filled 2016-07-20 (×3): qty 3

## 2016-07-20 MED ORDER — SODIUM CHLORIDE 0.9% FLUSH: 9.0000 mL | INTRAVENOUS | Status: DC | PRN

## 2016-07-20 MED ORDER — DIPHENHYDRAMINE HCL 25 MG PO CAPS
25.0000 mg | ORAL_CAPSULE | ORAL | Status: DC | PRN
  Administered 2016-07-20 – 2016-07-21 (×2): 50 mg via ORAL
  Filled 2016-07-20 (×2): qty 2

## 2016-07-20 MED ORDER — HYDROXYUREA 500 MG PO CAPS: 1000.0000 mg | ORAL_CAPSULE | ORAL | Status: DC

## 2016-07-20 MED ORDER — FOLIC ACID 1 MG PO TABS
2.0000 mg | ORAL_TABLET | Freq: Every day | ORAL | Status: DC
  Administered 2016-07-21: 2 mg via ORAL
  Filled 2016-07-20: qty 2

## 2016-07-20 MED ORDER — RIVAROXABAN 20 MG PO TABS
20.0000 mg | ORAL_TABLET | Freq: Every day | ORAL | Status: DC
  Administered 2016-07-20: 20 mg via ORAL
  Filled 2016-07-20: qty 1

## 2016-07-20 MED ORDER — HYDROMORPHONE HCL 1 MG/ML IJ SOLN
2.0000 mg | INTRAMUSCULAR | Status: AC
  Administered 2016-07-20: 2 mg via INTRAVENOUS
  Filled 2016-07-20: qty 2

## 2016-07-20 MED ORDER — KETOROLAC TROMETHAMINE 30 MG/ML IJ SOLN
30.0000 mg | Freq: Once | INTRAMUSCULAR | Status: AC
  Administered 2016-07-20: 30 mg via INTRAVENOUS
  Filled 2016-07-20: qty 1

## 2016-07-20 MED ORDER — NALOXONE HCL 0.4 MG/ML IJ SOLN: 0.4000 mg | INTRAMUSCULAR | Status: DC | PRN

## 2016-07-20 MED ORDER — DIPHENHYDRAMINE HCL 50 MG/ML IJ SOLN
25.0000 mg | INTRAMUSCULAR | Status: DC | PRN
  Filled 2016-07-20: qty 0.5

## 2016-07-20 MED ORDER — LISINOPRIL 5 MG PO TABS
2.5000 mg | ORAL_TABLET | Freq: Every day | ORAL | Status: DC
  Administered 2016-07-20: 2.5 mg via ORAL
  Filled 2016-07-20: qty 1

## 2016-07-20 MED ORDER — HYDROMORPHONE 1 MG/ML IV SOLN
INTRAVENOUS | Status: DC
  Administered 2016-07-20: 25 mg via INTRAVENOUS
  Administered 2016-07-21: 10.5 mg via INTRAVENOUS
  Administered 2016-07-21: 8 mg via INTRAVENOUS
  Administered 2016-07-21: 25 mg via INTRAVENOUS
  Filled 2016-07-20 (×2): qty 25

## 2016-07-20 MED ORDER — ZOLPIDEM TARTRATE 10 MG PO TABS
10.0000 mg | ORAL_TABLET | Freq: Every day | ORAL | Status: DC
  Administered 2016-07-20: 10 mg via ORAL
  Filled 2016-07-20: qty 1

## 2016-07-20 MED ORDER — ONDANSETRON HCL 4 MG/2ML IJ SOLN: 4.0000 mg | Freq: Four times a day (QID) | INTRAMUSCULAR | Status: DC | PRN

## 2016-07-20 MED ORDER — SENNOSIDES-DOCUSATE SODIUM 8.6-50 MG PO TABS
1.0000 | ORAL_TABLET | Freq: Two times a day (BID) | ORAL | Status: DC
  Administered 2016-07-20 – 2016-07-21 (×2): 1 via ORAL
  Filled 2016-07-20 (×2): qty 1

## 2016-07-20 MED ORDER — SODIUM CHLORIDE 0.45 % IV SOLN
INTRAVENOUS | Status: DC
  Administered 2016-07-20: 16:00:00 via INTRAVENOUS

## 2016-07-20 MED ORDER — MORPHINE SULFATE ER 30 MG PO TBCR
30.0000 mg | EXTENDED_RELEASE_TABLET | Freq: Two times a day (BID) | ORAL | Status: DC
  Administered 2016-07-20 – 2016-07-21 (×2): 30 mg via ORAL
  Filled 2016-07-20 (×2): qty 1

## 2016-07-20 MED ORDER — DIPHENHYDRAMINE HCL 25 MG PO CAPS
25.0000 mg | ORAL_CAPSULE | ORAL | Status: DC | PRN
  Administered 2016-07-20: 50 mg via ORAL
  Filled 2016-07-20: qty 2

## 2016-07-20 MED ORDER — POLYETHYLENE GLYCOL 3350 17 G PO PACK: 17.0000 g | PACK | Freq: Every day | ORAL | Status: DC | PRN

## 2016-07-20 MED ORDER — POTASSIUM CHLORIDE CRYS ER 20 MEQ PO TBCR
40.0000 meq | EXTENDED_RELEASE_TABLET | Freq: Every day | ORAL | Status: DC
  Administered 2016-07-21: 40 meq via ORAL
  Filled 2016-07-20: qty 2

## 2016-07-20 NOTE — Telephone Encounter (Signed)
Call patient back and advised that per provider he needs to stay home, rest, hydrate and take his home medication.  Explained to patient that he needs to schedule and appointment to come in the office and have his medications adjusted if needed.  Patient verbalizes understanding.  Andria Frames made the F/U appointment

## 2016-07-20 NOTE — ED Notes (Signed)
Pt declined subcutaneous medication

## 2016-07-20 NOTE — ED Provider Notes (Signed)
[facility] DEPT Provider Note   CSN: [id number] Arrival date & time: 07/20/16  1416     History   Chief Complaint Chief Complaint  Patient presents with  . Sickle Cell Pain Crisis    HPI [name] is a 36 y.o. male.  [name] is a 36 y.o. Male with a history of sickle cell disease who presents to the emergency department complaining of sickle cell pain crisis per patient reports he is having bilateral rib pain and bilateral leg pain since 8 AM this morning. He reports taking oral Dilaudid without relief of his symptoms.  No other treatments attempted prior to arrival. No exacerbating factors. He denies chest pain, shortness of breath, palpitations, abdominal pain, nausea, vomiting, diarrhea, rashes or fevers.   The history is provided by the patient and medical records. No language interpreter was used.  Sickle Cell Pain Crisis  Associated symptoms: no chest pain, no congestion, no cough, no fever, no headaches, no nausea, no shortness of breath, no sore throat, no vomiting and no wheezing     Past Medical History:  Diagnosis Date  . Acute chest syndrome ([place]) 06/18/2013  . Acute embolism and thrombosis of right internal jugular vein ([place])   . Alcohol consumption of one to four drinks per day   . Avascular necrosis (HCC)    Right Hip  . Blood transfusion   . Chronic anticoagulation   . Demand ischemia ([place]) 01/02/2014  . Former smoker   . Functional asplenia   . Hb-SS disease with crisis ([place])   . History of Clostridium difficile infection   . History of pulmonary embolus (PE)   . Hypertension   . Hypokalemia   . Leukocytosis    Chronic  . Mood disorder ([place])   . Noncompliance with medication regimen   . Oxygen deficiency   . Pulmonary hypertension ([place])   . Second hand tobacco smoke exposure   . Sickle cell anemia (HCC)   . Sickle-cell crisis with associated acute chest syndrome ([place]) 05/13/2013  . Stroke ([place])   . Thrombocytosis (HCC)    Chronic  . Uses marijuana     Patient Active Problem List   Diagnosis Date Noted  . Sickle cell pain crisis ([place]) 07/20/2016  . Sickle cell crisis ([place]) 02/23/2016  . H/O arterial ischemic stroke 11/11/2015  . Slurred speech 11/10/2015  . Chronic anemia   . Hb-SS disease without crisis ([place]) 10/07/2014  . Prolonged Q-T interval on ECG 09/10/2014  . Troponin level elevated 08/29/2014  . Cor pulmonale, chronic ([place]) 08/25/2014  . Sickle cell anemia ([place]) 06/25/2014  . Anemia of chronic disease 06/25/2014  . PAH (pulmonary artery hypertension) ([place]) 03/18/2014  . Chronic respiratory failure with hypoxia ([place]) 03/14/2014  . Paralytic strabismus, external ophthalmoplegia   . Chronic pain syndrome 12/12/2013  . Chronic anticoagulation 08/22/2013  . Essential hypertension 08/22/2013  . Pulmonary HTN ([place]) 06/18/2013  . Functional asplenia   . Vitamin D deficiency 02/13/2013  . Hx of pulmonary embolus 06/29/2012  . 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 Avascular necrosis [facility])     Past Surgical History:  Procedure Laterality Date  . CHOLECYSTECTOMY     01/2008  . Excision of left periauricular cyst     10/2009  . Excision of right ear lobe cyst with primary closur     11/2007  . Porta cath placement    . Porta cath removal    . PORTACATH PLACEMENT  01/05/2012   Procedure: INSERTION PORT-A-CATH;  Surgeon: Odis Hollingshead, MD;  Location: [facility];  Service: General;  Laterality: N/A;  ultrasound guiced port a cath insertion with fluoroscopy  . Right hip replacement     08/2006  . UMBILICAL HERNIA REPAIR     01/2008       Home Medications    Prior to Admission medications   Medication Sig Start Date End Date Taking? Authorizing Provider  carvedilol (COREG) 3.125 MG tablet Take 1 tablet (3.125 mg total) by mouth 2 (two) times daily with a meal. 05/17/16  Yes [name], MD  cholecalciferol (VITAMIN D) 1000 units tablet Take 2,000 Units by mouth daily.   Yes  [provider]  folic acid (FOLVITE) 1 MG tablet Take 2 tablets (2 mg total) by mouth daily. 04/25/16  Yes [name], MD  gabapentin (NEURONTIN) 300 MG capsule Take 1 capsule (300 mg total) by mouth 3 (three) times daily. 06/01/15  Yes Tresa Garter, MD  HYDROmorphone (DILAUDID) 4 MG tablet Take 1 tablet (4 mg total) by mouth every 4 (four) hours as needed for moderate pain or severe pain. 07/07/16 07/22/16 Yes Jegede, Marlena Clipper, MD  hydroxyurea (HYDREA) 500 MG capsule Take 500 mg by mouth every other day. May take with food to minimize GI side effects. 06/25/16  Yes [provider]  hydroxyurea (HYDREA) 500 MG capsule Take 1,000 mg by mouth every other day. May take with food to minimize GI side effects. 06/24/16  Yes [provider]  L-glutamine (ENDARI) 5 g PACK Powder Packet Take 15 g by mouth 2 (two) times daily. 04/25/16  Yes [name], MD  lisinopril (PRINIVIL,ZESTRIL) 2.5 MG tablet Take 2.5 mg by mouth at bedtime.  05/22/16  Yes [provider]  morphine (MS CONTIN) 30 MG 12 hr tablet Take 1 tablet (30 mg total) by mouth every 12 (twelve) hours. 07/09/16 08/08/16 Yes Scot Jun, FNP  potassium chloride SA (K-DUR,KLOR-CON) 20 MEQ tablet Take 2 tablets (40 mEq total) by mouth daily. 04/27/16  Yes [name], MD  rivaroxaban (XARELTO) 20 MG TABS tablet Take 1 tablet (20 mg total) by mouth at bedtime. 06/23/16  Yes Dorena Dew, FNP  torsemide (DEMADEX) 20 MG tablet Take 1 tablet (20 mg total) by mouth daily as needed (leg swelling). 06/13/16  Yes [name], MD  zolpidem (AMBIEN) 10 MG tablet Take 1 tablet (10 mg total) by mouth at bedtime. 07/05/16 08/04/16 Yes Scot Jun, FNP    Family History Family History  Problem Relation Age of Onset  . Sickle cell trait Mother   . Depression Mother   . Diabetes Mother   . Sickle cell trait Father   . Sickle cell trait Brother     Social History Social  History  Substance Use Topics  . Smoking status: Former Smoker    Packs/day: 0.50    Years: 10.00    Types: Cigarettes    Quit date: 05/29/2011  . Smokeless tobacco: Never Used  . Alcohol use No     Allergies   Patient has no known allergies.   Review of Systems Review of Systems  Constitutional: Negative for chills and fever.  HENT: Negative for congestion and sore throat.   Eyes: Negative for visual disturbance.  Respiratory: Negative for cough, shortness of breath and wheezing.   Cardiovascular: Negative for chest pain and palpitations.       Rib pain   Gastrointestinal: Negative for abdominal pain, diarrhea, nausea and vomiting.  Genitourinary: Negative for dysuria.  Musculoskeletal: Positive for arthralgias. Negative for back pain and neck pain.  Skin: Negative for rash.  Neurological: Negative for syncope, light-headedness, numbness and headaches.     Physical Exam Updated Vital Signs BP (!) 129/91 (BP Location: Right Arm)   Pulse 80   Temp 98.7 F (37.1 C) (Oral)   Resp 16   Ht 6' (1.829 m)   Wt 86.5 kg (190 lb 9.6 oz)   SpO2 93%   BMI 25.85 kg/m   Physical Exam  Constitutional: He is oriented to person, place, and time. He appears well-developed and well-nourished. No distress.  Nontoxic appearing.  HENT:  Head: Normocephalic and atraumatic.  Mouth/Throat: Oropharynx is clear and moist.  Eyes: Conjunctivae are normal. Pupils are equal, round, and reactive to light. Right eye exhibits no discharge. Left eye exhibits no discharge.  Neck: Neck supple. No JVD present.  Cardiovascular: Normal rate, regular rhythm, normal heart sounds and intact distal pulses.  Exam reveals no gallop and no friction rub.   No murmur heard. Pulmonary/Chest: Effort normal and breath sounds normal. No stridor. No respiratory distress. He has no wheezes. He has no rales. He exhibits no tenderness.  Lungs are clear to ascultation bilaterally. Symmetric chest expansion bilaterally.  No increased work of breathing. No rales or rhonchi.  No chest wall TTP.   Abdominal: Soft. There is no tenderness. There is no guarding.  Musculoskeletal: Normal range of motion. He exhibits no edema or deformity.  Patient's bilateral shoulder, elbow, wrist, hip, knee and ankle joints are supple and without erythema or edema.  Lymphadenopathy:    He has no cervical adenopathy.  Neurological: He is alert and oriented to person, place, and time. Coordination normal.  Skin: Skin is warm and dry. Capillary refill takes less than 2 seconds. No rash noted. He is not diaphoretic. No erythema. No pallor.  Psychiatric: He has a normal mood and affect. His behavior is normal.  Nursing note and vitals reviewed.    ED Treatments / Results  Labs (all labs ordered are listed, but only abnormal results are displayed) Labs Reviewed  COMPREHENSIVE METABOLIC PANEL - Abnormal; Notable for the following:       Result Value   Potassium 3.2 (*)    CO2 21 (*)    Glucose, Bld 107 (*)    AST 45 (*)    Total Bilirubin 5.6 (*)    All other components within normal limits  CBC WITH DIFFERENTIAL/PLATELET - Abnormal; Notable for the following:    WBC 15.0 (*)    RBC 2.81 (*)    Hemoglobin 8.5 (*)    HCT 25.1 (*)    RDW 18.5 (*)    Platelets 525 (*)    Neutro Abs 11.1 (*)    Monocytes Absolute 1.8 (*)    All other components within normal limits  RETICULOCYTES - Abnormal; Notable for the following:    Retic Ct Pct 12.0 (*)    RBC. 2.81 (*)    Retic Count, Absolute 337.2 (*)    All other components within normal limits  MAGNESIUM  I-STAT TROPOININ, ED    EKG  EKG Interpretation  Date/Time:  Wednesday July 20 2016 16:28:10 EDT Ventricular Rate:  82 PR Interval:    QRS Duration: 121 QT Interval:  424 QTC Calculation: 496 R Axis:   -73 Text Interpretation:  Sinus rhythm Multiple premature complexes, vent & supraven Probable left atrial enlargement Nonspecific IVCD with LAD Inferior infarct, old  Abnrm T,  consider ischemia, anterolateral lds No significant change since last tracing Confirmed by Merrily Pew [phone]) on 07/20/2016 4:37:03 PM       Radiology Dg Chest 2 View  Result Date: 07/20/2016 CLINICAL DATA:  36 year old male with sickle cell disease. Bilateral rib and leg pain. EXAM: CHEST  2 VIEW COMPARISON:  Chest CTA 07/18/2016 and earlier. FINDINGS: Stable cardiomegaly and mediastinal contours. Stable left subclavian Port-A-Cath, accessed. Stable to mildly improved lung volumes. No pneumothorax. Stable pulmonary vascularity without acute edema. No pleural effusion or confluent pulmonary opacity. Stable cholecystectomy clips. Negative visible bowel gas pattern. Stable visualized osseous structures, osteopenia and also H-type vertebral body changes better demonstrated on the recent CTA. IMPRESSION: Stable cardiomegaly. No acute cardiopulmonary abnormality. Electronically Signed   By: Genevie Ann M.D.   On: 07/20/2016 16:47    Procedures Procedures (including critical care time)  Medications Ordered in ED Medications  morphine (MS CONTIN) 12 hr tablet 30 mg (not administered)  zolpidem (AMBIEN) tablet 10 mg (not administered)  hydroxyurea (HYDREA) capsule 500 mg (not administered)  hydroxyurea (HYDREA) capsule 1,000 mg (not administered)  rivaroxaban (XARELTO) tablet 20 mg (not administered)  lisinopril (PRINIVIL,ZESTRIL) tablet 2.5 mg (not administered)  carvedilol (COREG) tablet 3.125 mg (not administered)  L-glutamine (ENDARI) Powder Packet 15 g (not administered)  folic acid (FOLVITE) tablet 2 mg (not administered)  potassium chloride SA (K-DUR,KLOR-CON) CR tablet 40 mEq (not administered)  gabapentin (NEURONTIN) capsule 300 mg (not administered)  senna-docusate (Senokot-S) tablet 1 tablet (not administered)  polyethylene glycol (MIRALAX / GLYCOLAX) packet 17 g (not administered)  naloxone (NARCAN) injection 0.4 mg (not administered)    And  sodium chloride flush (NS) 0.9 %  injection 9 mL (not administered)  ondansetron (ZOFRAN) injection 4 mg (not administered)  diphenhydrAMINE (BENADRYL) capsule 25-50 mg (not administered)    Or  diphenhydrAMINE (BENADRYL) 25 mg in sodium chloride 0.9 % 50 mL IVPB (not administered)  HYDROmorphone (DILAUDID) 1 mg/mL PCA injection (25 mg Intravenous Set-up / Initial Syringe 07/20/16 2035)  HYDROmorphone (DILAUDID) injection 2 mg (2 mg Intravenous Given 07/20/16 1623)    Or  HYDROmorphone (DILAUDID) injection 2 mg ( Subcutaneous See Alternative 07/20/16 1623)  HYDROmorphone (DILAUDID) injection 2 mg (2 mg Intravenous Given 07/20/16 1733)    Or  HYDROmorphone (DILAUDID) injection 2 mg ( Subcutaneous See Alternative 07/20/16 1733)  HYDROmorphone (DILAUDID) injection 2 mg (2 mg Intravenous Given 07/20/16 1806)    Or  HYDROmorphone (DILAUDID) injection 2 mg ( Subcutaneous See Alternative 07/20/16 1806)  ketorolac (TORADOL) 30 MG/ML injection 30 mg (30 mg Intravenous Given 07/20/16 1919)     Initial Impression / Assessment and Plan / ED Course  I have reviewed the triage vital signs and the nursing notes.  Pertinent labs & imaging results that were available during my care of the patient were reviewed by me and considered in my medical decision making (see chart for details).     This is a 36 y.o. Male with a history of sickle cell disease who presents to the emergency department complaining of sickle cell pain crisis per patient reports he is having bilateral rib pain and bilateral leg pain since 8 AM this morning. He denies chest pain or shortness of breath. On exam patient is afebrile nontoxic appearing. Lungs clear to auscultation bilaterally. No chest wall tenderness to palpation. EKG is unchanged from his previous tracing. Blood work here shows a hemoglobin that is an improvement from his baseline. CMP shows preserved kidney function. Troponin is not elevated. Chest  x-ray shows no acute findings. No findings to suggest acute chest  syndrome. After 3 rounds of pain medication patient reports he is still having significant pain and would like to be admitted to the hospital. Will plan for admission.  I consulted with [facility] hospitalist Dr. Loleta Books who accepted the patient for admission.   Final Clinical Impressions(s) / ED Diagnoses   Final diagnoses:  Sickle cell pain crisis [facility])    New Prescriptions Current Discharge Medication List       Sharmaine Base 07/20/16 2059    Mesner, Corene Cornea, MD 07/21/16 0030

## 2016-07-20 NOTE — ED Triage Notes (Signed)
Pt comes in with complaints of bilateral rib and leg pain associated with Sickle Cell Pain.  Pt states he took his medications this morning but it did not help.  Ambulatory and A&O x4.  Denies chest pain.

## 2016-07-20 NOTE — Telephone Encounter (Signed)
Patient is requesting to be seen at patient care center.  Patient C/O pain to ribs that is 8/10 on pain scale.  Patient denies chest pain or shortness of breath.  Patient denies fever, N/V/D, or abdominal pain.  Patient states pain medication is not working. I advised I would notify the provider and give him a call back.

## 2016-07-21 ENCOUNTER — Ambulatory Visit: Payer: Self-pay | Admitting: Family Medicine

## 2016-07-21 DIAGNOSIS — I2782 Chronic pulmonary embolism: Secondary | ICD-10-CM | POA: Diagnosis not present

## 2016-07-21 DIAGNOSIS — R52 Pain, unspecified: Secondary | ICD-10-CM | POA: Diagnosis present

## 2016-07-21 DIAGNOSIS — Z87891 Personal history of nicotine dependence: Secondary | ICD-10-CM | POA: Diagnosis not present

## 2016-07-21 DIAGNOSIS — I429 Cardiomyopathy, unspecified: Secondary | ICD-10-CM | POA: Diagnosis not present

## 2016-07-21 DIAGNOSIS — I2781 Cor pulmonale (chronic): Secondary | ICD-10-CM

## 2016-07-21 DIAGNOSIS — J9611 Chronic respiratory failure with hypoxia: Secondary | ICD-10-CM | POA: Diagnosis not present

## 2016-07-21 DIAGNOSIS — G894 Chronic pain syndrome: Secondary | ICD-10-CM | POA: Diagnosis not present

## 2016-07-21 DIAGNOSIS — I2721 Secondary pulmonary arterial hypertension: Secondary | ICD-10-CM | POA: Diagnosis not present

## 2016-07-21 DIAGNOSIS — D571 Sickle-cell disease without crisis: Secondary | ICD-10-CM | POA: Diagnosis not present

## 2016-07-21 DIAGNOSIS — Z7901 Long term (current) use of anticoagulants: Secondary | ICD-10-CM | POA: Diagnosis not present

## 2016-07-21 DIAGNOSIS — D5700 Hb-SS disease with crisis, unspecified: Secondary | ICD-10-CM | POA: Diagnosis not present

## 2016-07-21 LAB — RETICULOCYTES
RBC.: 2.76 MIL/uL — ABNORMAL LOW (ref 4.22–5.81)
RETIC COUNT ABSOLUTE: 380.9 10*3/uL — AB (ref 19.0–186.0)
RETIC CT PCT: 13.8 % — AB (ref 0.4–3.1)

## 2016-07-21 IMAGING — CR DG CHEST 2V
2 series · 2 of 2 positions shown · non-contrast
Comparison: CT 08/26/2013.  Radiographs 08/25/2013.

CLINICAL DATA: Shortness of breath.  History of sickle cell anemia.

EXAM:
CHEST  2 VIEW

[w chest pa]
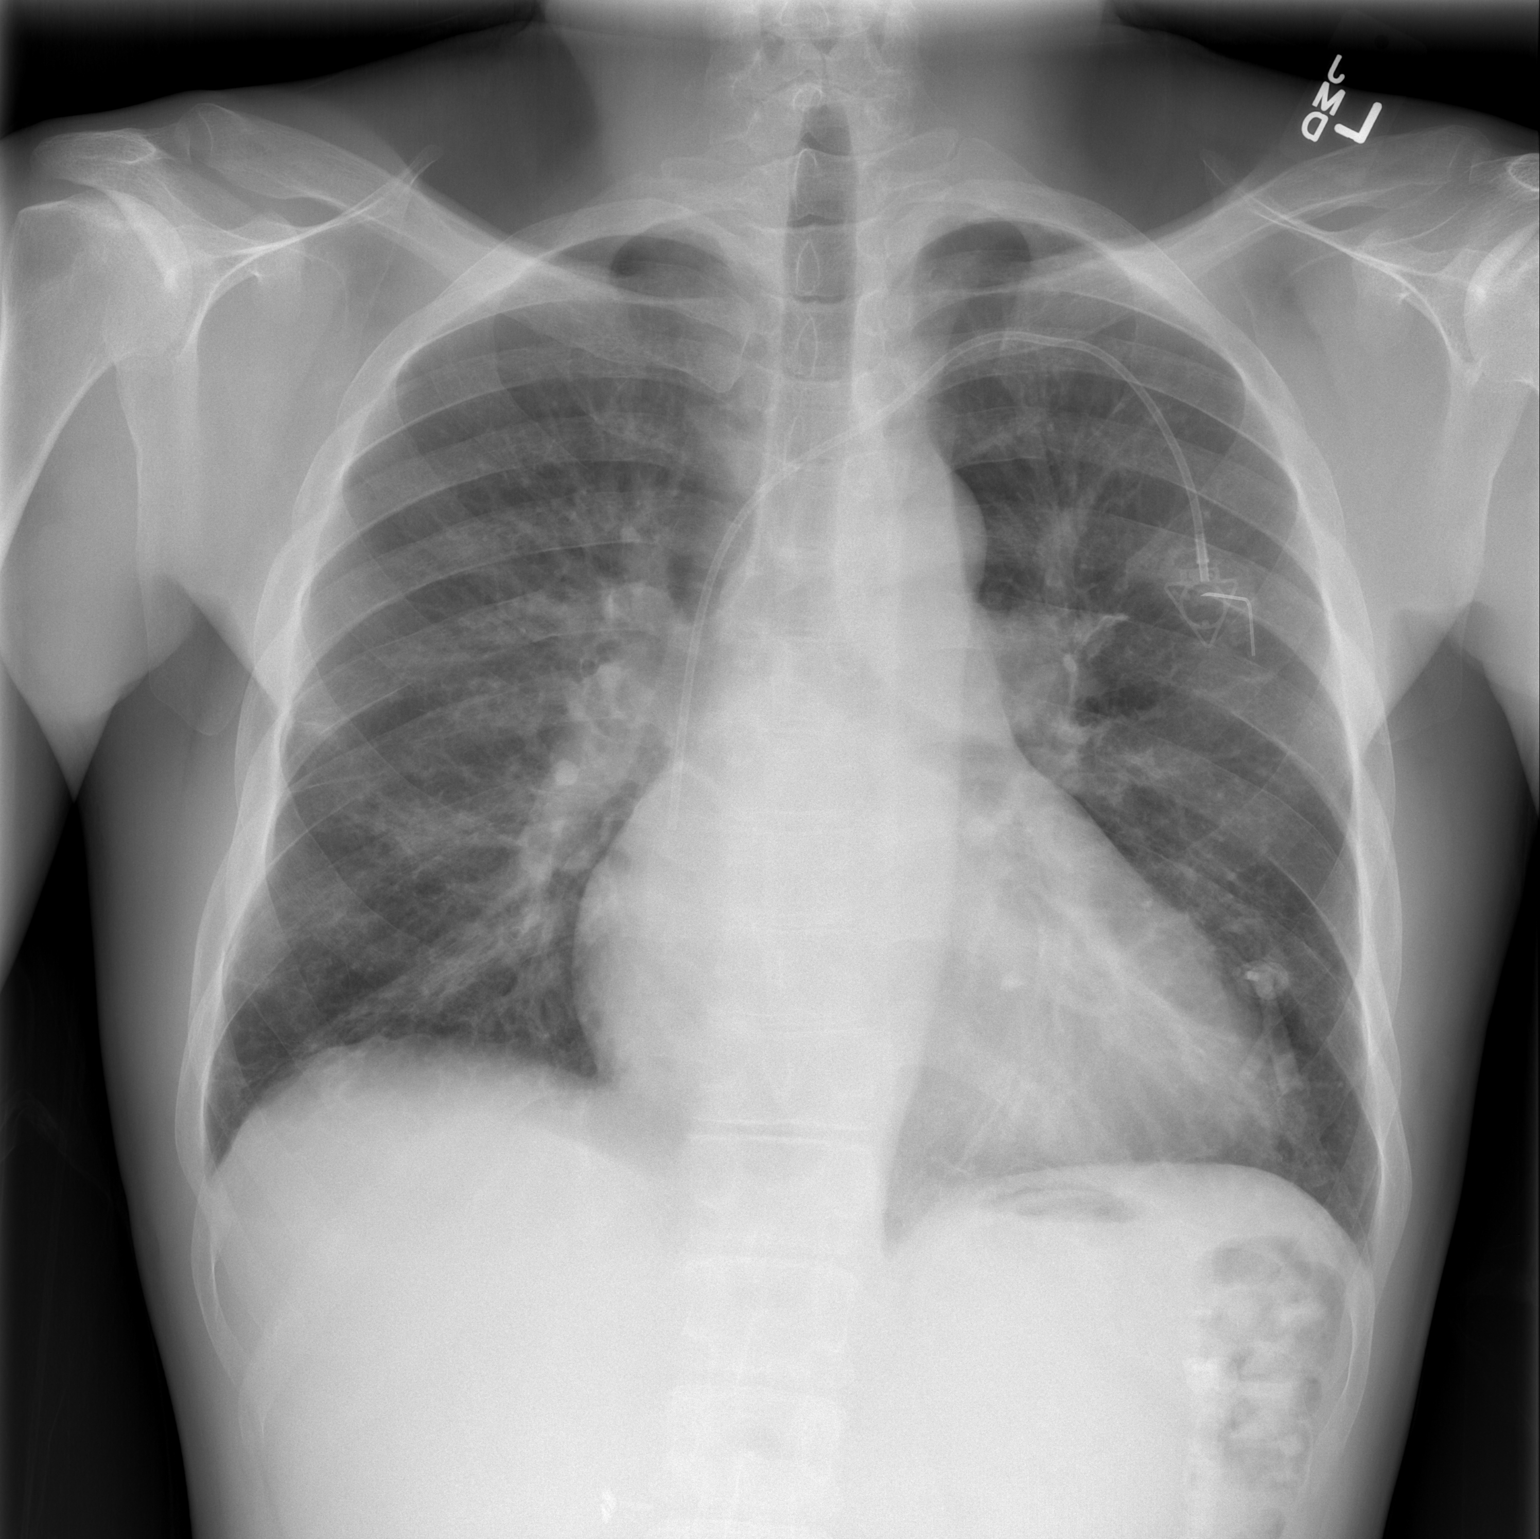

[w chest lat]
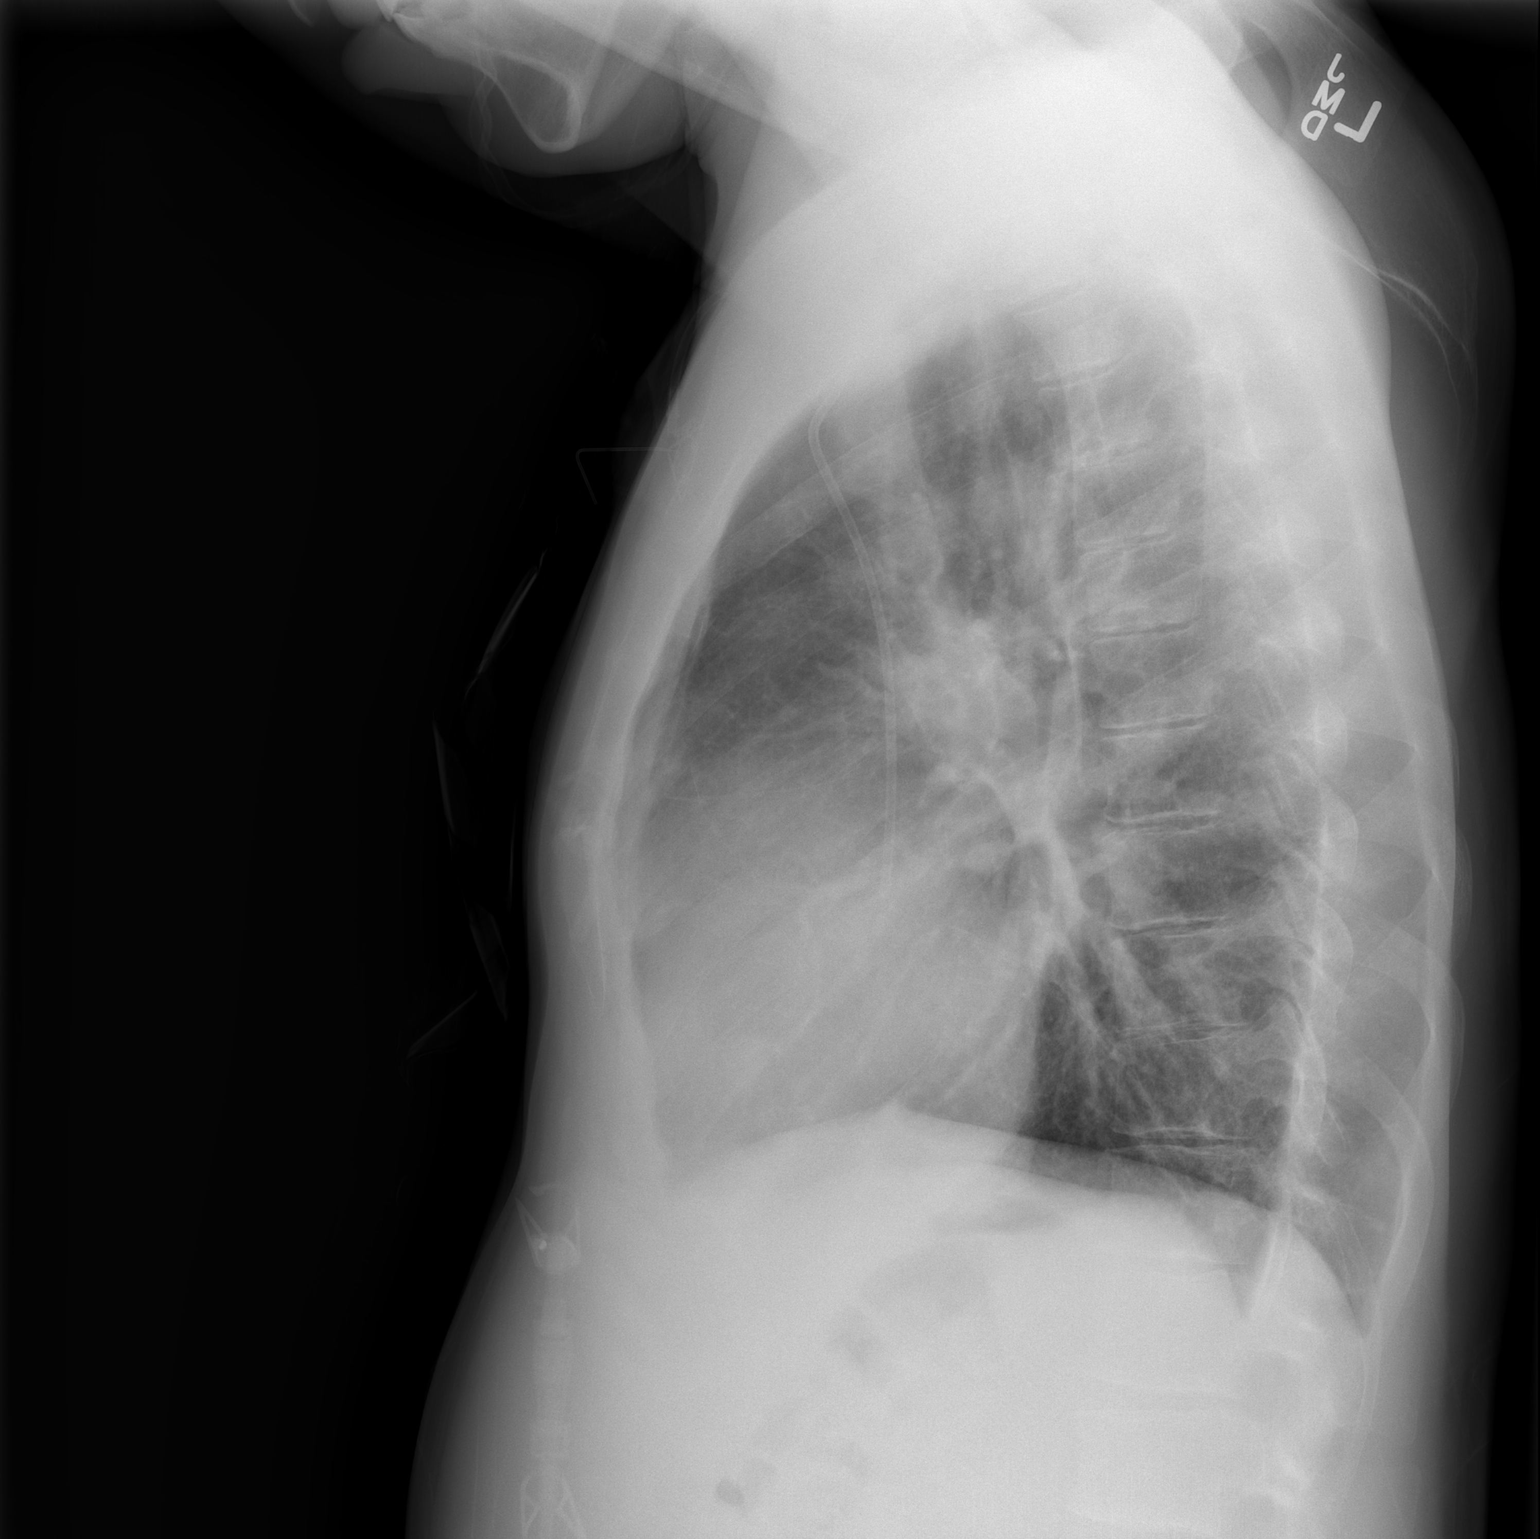

[2 of 2 positions shown; findings below may reference images not displayed]

FINDINGS: The left subclavian Port-A-Cath tip is unchanged at the SVC right
atrial junction. There is stable cardiomegaly. Generalized
accentuation of the interstitial markings and patchy bibasilar
opacities are unchanged. There is no progressive consolidation or
significant pleural effusion. There are stable endplate changes
within the thoracic spine consistent with sickle cell anemia.
IMPRESSION: Radiographically stable chest with cardiomegaly, accentuated
interstitial markings and patchy bibasilar pulmonary opacities. No
new findings demonstrated.

## 2016-07-21 MED ORDER — POTASSIUM CHLORIDE CRYS ER 20 MEQ PO TBCR
40.0000 meq | EXTENDED_RELEASE_TABLET | Freq: Once | ORAL | Status: AC
  Administered 2016-07-21: 40 meq via ORAL
  Filled 2016-07-21: qty 2

## 2016-07-21 MED ORDER — SODIUM CHLORIDE 0.9% FLUSH: 10.0000 mL | INTRAVENOUS | Status: DC | PRN

## 2016-07-21 MED ORDER — HEPARIN SOD (PORK) LOCK FLUSH 100 UNIT/ML IV SOLN
500.0000 [IU] | Freq: Once | INTRAVENOUS | Status: AC
  Administered 2016-07-21: 500 [IU] via INTRAVENOUS
  Filled 2016-07-21: qty 5

## 2016-07-21 MED ORDER — HEPARIN SOD (PORK) LOCK FLUSH 100 UNIT/ML IV SOLN: 500.0000 [IU] | INTRAVENOUS | Status: DC | PRN

## 2016-07-21 MED ORDER — HEPARIN SOD (PORK) LOCK FLUSH 100 UNIT/ML IV SOLN: 250.0000 [IU] | INTRAVENOUS | Status: DC | PRN

## 2016-07-21 NOTE — Progress Notes (Signed)
07/21/16  1730  Reviewed discharge instructions with patient. Patient verbalized understanding of discharge instructions. Copy of discharge instructions given to patient.

## 2016-07-21 NOTE — Care Management Obs Status (Signed)
[place] NOTIFICATION   Patient Details  Name: NALU TROUBLEFIELD MRN: [id number] Date of Birth: 03/01/1980   Medicare Observation Status Notification Given:  Yes    Lynnell Catalan, RN 07/21/2016, 4:16 PM

## 2016-07-21 NOTE — Progress Notes (Signed)
07/21/16  1730  Wasted 65mL of Dilaudid PCA with Jenny Reichmann, RN in [facility].

## 2016-07-21 NOTE — Consult Note (Signed)
   [facility] CM Inpatient Consult   07/21/2016  [name] 1980/08/23 [id number]   Came to visit [name] to discuss re-engaging with [place] [facility] program.   He declined stating he does not need the service. Attempts have made recently to engage for [place] [facility] program without success.  [name] denies any care coordination, medication, transportation, or disease management needs.  Will make inpatient RNCM aware.   Marthenia Rolling, MSN-Ed, RN,BSN [facility] Liaison [phone]

## 2016-07-21 NOTE — Discharge Summary (Signed)
[name] MRN: [id number] DOB/AGE: [birth year] 36 y.o.  Admit date: 07/20/2016 Discharge date: 07/21/2016  Primary Care Physician:  Tresa Garter, MD   Discharge Diagnoses:   Patient Active Problem List   Diagnosis Date Noted  . Cor pulmonale, chronic ([place]) 08/25/2014    Priority: High  . Pulmonary HTN ([place]) 06/18/2013    Priority: High  . Functional asplenia     Priority: High  . Anemia of chronic disease 06/25/2014    Priority: Medium  . Chronic respiratory failure with hypoxia ([place]) 03/14/2014    Priority: Medium  . Chronic anticoagulation 08/22/2013    Priority: Medium  . Avascular necrosis ([place])     Priority: Low  . Sickle cell crisis ([place]) 02/23/2016  . H/O arterial ischemic stroke 11/11/2015  . Slurred speech 11/10/2015  . Chronic anemia   . Hb-SS disease without crisis ([place]) 10/07/2014  . Prolonged Q-T interval on ECG 09/10/2014  . Sickle cell anemia ([place]) 06/25/2014  . PAH (pulmonary artery hypertension) ([place]) 03/18/2014  . Paralytic strabismus, external ophthalmoplegia   . Chronic pain syndrome 12/12/2013  . Essential hypertension 08/22/2013  . Vitamin D deficiency 02/13/2013  . Hx of pulmonary embolus 06/29/2012  . Secondary hemochromatosis 12/14/2011    DISCHARGE MEDICATION: Allergies as of 07/21/2016   No Known Allergies     Medication List    TAKE these medications   carvedilol 3.125 MG tablet Commonly known as:  COREG Take 1 tablet (3.125 mg total) by mouth 2 (two) times daily with a meal.   cholecalciferol 1000 units tablet Commonly known as:  VITAMIN D Take 2,000 Units by mouth daily.   folic acid 1 MG tablet Commonly known as:  FOLVITE Take 2 tablets (2 mg total) by mouth daily.   gabapentin 300 MG capsule Commonly known as:  NEURONTIN Take 1 capsule (300 mg total) by mouth 3 (three) times daily.   HYDROmorphone 4 MG tablet Commonly known as:  DILAUDID Take 1 tablet (4 mg total) by mouth every 4 (four) hours as needed for  moderate pain or severe pain.   hydroxyurea 500 MG capsule Commonly known as:  HYDREA Take 1,000 mg by mouth every other day. May take with food to minimize GI side effects.   hydroxyurea 500 MG capsule Commonly known as:  HYDREA Take 500 mg by mouth every other day. May take with food to minimize GI side effects.   L-glutamine 5 g Pack Powder Packet Commonly known as:  ENDARI Take 15 g by mouth 2 (two) times daily.   lisinopril 2.5 MG tablet Commonly known as:  PRINIVIL,ZESTRIL Take 2.5 mg by mouth at bedtime.   morphine 30 MG 12 hr tablet Commonly known as:  MS CONTIN Take 1 tablet (30 mg total) by mouth every 12 (twelve) hours.   potassium chloride SA 20 MEQ tablet Commonly known as:  K-DUR,KLOR-CON Take 2 tablets (40 mEq total) by mouth daily.   rivaroxaban 20 MG Tabs tablet Commonly known as:  XARELTO Take 1 tablet (20 mg total) by mouth at bedtime.   torsemide 20 MG tablet Commonly known as:  DEMADEX Take 1 tablet (20 mg total) by mouth daily as needed (leg swelling).   zolpidem 10 MG tablet Commonly known as:  AMBIEN Take 1 tablet (10 mg total) by mouth at bedtime.         Consults:    SIGNIFICANT DIAGNOSTIC STUDIES:  Dg Chest 2 View  Result Date: 07/20/2016 CLINICAL DATA:  36 year old male with sickle cell  disease. Bilateral rib and leg pain. EXAM: CHEST  2 VIEW COMPARISON:  Chest CTA 07/18/2016 and earlier. FINDINGS: Stable cardiomegaly and mediastinal contours. Stable left subclavian Port-A-Cath, accessed. Stable to mildly improved lung volumes. No pneumothorax. Stable pulmonary vascularity without acute edema. No pleural effusion or confluent pulmonary opacity. Stable cholecystectomy clips. Negative visible bowel gas pattern. Stable visualized osseous structures, osteopenia and also H-type vertebral body changes better demonstrated on the recent CTA. IMPRESSION: Stable cardiomegaly. No acute cardiopulmonary abnormality. Electronically Signed   By: Genevie Ann  M.D.   On: 07/20/2016 16:47   Dg Chest 2 View  Result Date: 07/18/2016 CLINICAL DATA:  Bilateral chest and back pain, somnolence. Former smoker. History of sickle cell anemia, pulmonary embolus. EXAM: CHEST  2 VIEW COMPARISON:  Portable chest x-ray of July 14, 2016 FINDINGS: The lungs are well-expanded. There is no focal infiltrate. No significant pleural effusion is visible. The cardiac silhouette remains enlarged. The pulmonary vascularity remains engorged. The pulmonary interstitial markings are mildly increased over baseline. The power port catheter tip projects over the cavoatrial junction. The mediastinum is normal in width. The bony thorax exhibits no acute abnormality. IMPRESSION: COPD and smoking related changes, stable. Mild pulmonary interstitial prominence bilaterally suggests low-grade CHF. Stable cardiomegaly. Electronically Signed   By: David  Martinique M.D.   On: 07/18/2016 13:05   Dg Chest 2 View  Result Date: 07/03/2016 CLINICAL DATA:  Sickle cell anemia.  Pain crisis. EXAM: CHEST  2 VIEW COMPARISON:  06/29/2016 FINDINGS: Left Port-A-Cath remains in place, unchanged. Cardiomegaly with vascular congestion. No confluent opacities or effusions. No acute bony abnormality. IMPRESSION: Cardiomegaly with vascular congestion. Electronically Signed   By: Rolm Baptise M.D.   On: 07/03/2016 18:51   Dg Chest 2 View  Result Date: 06/29/2016 CLINICAL DATA:  Sickle cell crisis with chest pain and shortness of breath. EXAM: CHEST  2 VIEW COMPARISON:  06/26/2016; 04/29/2016; chest CT - 05/06/2016 FINDINGS: Grossly unchanged enlarged cardiac silhouette and mediastinal contours. Stable position of support apparatus. Suspected slight worsening of linear heterogeneous potential airspace opacities within the right mid lung. Peripheral and basilar heterogeneous opacities are unchanged. No pleural effusion or pneumothorax. No evidence of edema. No acute osseus abnormalities. IMPRESSION: Worsening ill-defined right  mid lung heterogeneous opacities could be indicative of early acute chest syndrome given provided history of sickle cell disease. Electronically Signed   By: Sandi Mariscal M.D.   On: 06/29/2016 12:37   Ct Angio Chest Pe W Or Wo Contrast  Addendum Date: 07/18/2016   ADDENDUM REPORT: 07/18/2016 19:21 ADDENDUM: Add to IMPRESSION: Suspect a degree of underlying pulmonary arterial hypertension given prominence in the main pulmonary outflow tract. Electronically Signed   By: Lowella Grip III M.D.   On: 07/18/2016 19:21   Result Date: 07/18/2016 CLINICAL DATA:  Chest pain.  History of sickle cell disease EXAM: CT ANGIOGRAPHY CHEST WITH CONTRAST TECHNIQUE: Multidetector CT imaging of the chest was performed using the standard protocol during bolus administration of intravenous contrast. Multiplanar CT image reconstructions and MIPs were obtained to evaluate the vascular anatomy. CONTRAST:  90 mL Isovue 370 nonionic COMPARISON:  Chest radiograph July 18, 2016; chest CT May 06, 2016 FINDINGS: Cardiovascular: There is a pulmonary embolus in a posterior segment right lower lobe pulmonary artery branch, similar to prior study. No more central pulmonary embolus evident. There is not felt to be of right heart strain. Heart is diffusely enlarged. There is a small pericardial effusion. There is no thoracic aortic aneurysm or dissection. Port-A-Cath  tip is at the cavoatrial junction. Main pulmonary outflow tract measures 3.8 cm in diameter consistent with pulmonary arterial hypertension. Mediastinum/Nodes: Visualized thyroid appears normal. There are multiple mediastinal lymph nodes which appear similar to the previous study. There is not felt to be frank adenopathy by size criteria. Lungs/Pleura: There is bibasilar atelectasis. There is interstitial pulmonary edema. There is a stable nodular opacity in the right middle lobe seen on axial slice 51 series 11 measuring 3 mm. There is a stable nodular opacity in the lateral  segment of the right middle lobe measuring 6 x 6 mm, ground-glass in appearance, best seen on axial slice 51 series 11. There is no appreciable pleural effusion or pleural thickening. Upper Abdomen: Spleen is small and calcified consistent with prior infarct from sickle cell disease. There is reflux of contrast into the inferior vena cava and hepatic veins. Visualized upper abdominal structures otherwise appear unremarkable. Musculoskeletal: There are endplate infarcts and multiple thoracic spine levels. Multiple areas of sclerosis noted consistent with sickle cell disease. No appreciable extramedullary hematopoiesis noted. Review of the MIP images confirms the above findings. IMPRESSION: Small pulmonary embolus in a right lower lobe pulmonary artery branch, also present on most recent study. Suspect small chronic pulmonary embolus focus. No new pulmonary embolus compared to most recent study. Findings felt to be indicative of a degree of congestive heart failure. Reflux of contrast into the hepatic veins and inferior vena cava may indicate increase in right heart pressure. Nodular opacities, largest measuring 6 x 6 mm, ground-glass in appearance. This nodular opacity noted in right middle lobe. Initial follow-up with CT at 6-12 months is recommended to confirm persistence. If persistent, repeat CT is recommended every 2 years until 5 years of stability has been established. This recommendation follows the consensus statement: Guidelines for Management of Incidental Pulmonary Nodules Detected on CT Images: From the Fleischner Society 2017; Radiology 2017; 284:228-243. Multiple small lymph nodes, stable. Bony changes and splenic appearance consistent with known sickle cell disease. Critical Value/emergent results were called by telephone at the time of interpretation on 07/18/2016 at 4:18 pm to Dr. Jola Schmidt , who verbally acknowledged these results. Electronically Signed: By: Lowella Grip III M.D. On:  07/18/2016 16:19   Dg Chest Port 1 View  Result Date: 07/14/2016 CLINICAL DATA:  36 year old male with history of sickle cell disease presenting with bilateral chest pain, body pain and shortness of breath today. EXAM: PORTABLE CHEST 1 VIEW COMPARISON:  Chest x-ray 07/03/2016. FINDINGS: Mild blunting of the left costophrenic sulcus and obscuration of the lateral left hemidiaphragm suggesting a small left pleural effusion. No right pleural effusion. No definite consolidative airspace disease. Scattered areas of linear scarring in the lung bases, similar to prior studies. Cephalization of the pulmonary vasculature, without frank pulmonary edema. No pneumothorax. Heart size is mildly enlarged. Upper mediastinal contours are within normal limits. Left-sided subclavian single-lumen power porta cath with tip terminating at the superior cavoatrial junction. IMPRESSION: 1. New small left pleural effusion. 2. Chronic scarring in the lung bases bilaterally. 3. Cardiomegaly with pulmonary venous congestion, but no frank pulmonary edema. Electronically Signed   By: Vinnie Langton M.D.   On: 07/14/2016 08:07   Dg Chest Port 1 View  Result Date: 06/26/2016 CLINICAL DATA:  Hypoxia, sickle cell EXAM: PORTABLE CHEST 1 VIEW COMPARISON:  06/06/2016 FINDINGS: Mild lingular and right lower lobe opacities, atelectasis versus pneumonia. No pleural effusion or pneumothorax. Cardiomegaly. Left chest power port terminates in the cavoatrial junction. IMPRESSION: Mild lingular and  right lower lobe opacities, atelectasis versus pneumonia Electronically Signed   By: Julian Hy M.D.   On: 06/26/2016 07:16       No results found for this or any previous visit (from the past 240 hour(s)).  BRIEF ADMITTING H & P: This is an opiate tolerant patient with Hb SS who also has a significant component of chronic pain. Pt had been to the ED  On 3 occasions within the last week for c/o pain in his ribs. He again presented to the Ed  last night with pain in his ribs and stating that it was characteristic of Sickle Cell crisis. He was initially managed in the ED and without any significant relief of his pain, he was admitted for treatment of Sickle Cell Crisis. He had no other acute medical findings or concerns that required acute medical attention.    Hospital Course:  Present on Admission: . Chronic pain syndrome . Chronic respiratory failure with hypoxia ([place]) . Cor pulmonale, chronic ([place]) . Essential hypertension . PAH (pulmonary artery hypertension) ([place]) . Sickle cell anemia ([facility])  Pt was admitted for Sickle Cell crisis and managed with Dilaudid via PCA, Toradol and IVF. Today the patient still has pain . However Mr. Conroy receives chronic RBC pheresis and as such his % of Hb S is at the level of someone with Sickle Cell trait. Thus although he has SCD, at these levels of Hb S, it is not a contributing factor to his pain. His presentation  is as a result of chronic pain exacerbated by his non-adherence to Oxygen use. As suspected, Mr. Foisy was without his Oxygen for 2 days as he was at rehearsal celebration for a friends wedding in which he is participating and did not wear his Oxygen. I have discussed this with Mr. Bialas and again impressed upon him the importance of complying with his Oxygen. I have spoken with Dr. Doreene Burke and advised that Mr. Franzen should be referred to a Physician who has expertise in managing complex pain as I feel that the complexity of his pain is beyond that of a Generalist who does not specialize in pain.   Pt was also noted to have a mild hypokalemia. This was orally replaced and he should have both his Potassium and Magnesium checked on next office visit,.   He also has Chronic Respiratory failure with hypoxia due to  [place] and Cor-Pulmonale and is on 3 L/min of Oxygen. I personally checked his saturations on 3 L/min of Oxygen while ambulating and his saturations were 92% and HR 86 BPM. He  had no increased work of breathing and no conversation dyspnea. He is continued on 3 L/min of Oxygen and has a follow-up appointment with [facility] on 08/19/2016. He was previously on Letairis which was discontinued during a previous hospitalization at [facility] due to poor follow up with pulmonology.  Pt also is on Chronic anticoagulation due to chronic and  recurrent PE's and reports adherence to Xarelto.    Disposition and Follow-up: Pt is discharged home in good condition and has a follow up appointment with NP Ms. Hollis on 07/26/16.    DISCHARGE EXAM:  General: Alert, awake, oriented x3, in no apparent distress.  HEENT: [place]/AT PEERL, EOMI, anicteric Neck: Trachea midline, no masses, no thyromegal,y no JVD, no carotid bruit OROPHARYNX: Moist, No exudate/ erythema/lesions.  Heart: Regular rate and rhythm. II/VI SEM unchanged from previous rubs, gallops or S3. PMI non-displaced. Exam reveals no decreased pulses. Pulmonary/Chest: Normal effort. Breath  sounds normal. No. Apnea. Clear to auscultation,no stridor,  no wheezing and no rhonchi noted. No respiratory distress and no tenderness noted. Abdomen: Soft, nontender, nondistended, normal bowel sounds, no masses no hepatosplenomegaly noted. No fluid wave and no ascites. There is no guarding or rebound. Neuro: Alert and oriented to person, place and time. Normal motor skills, Displays no atrophy or tremors and exhibits normal muscle tone.  No focal neurological deficits noted cranial nerves II through XII grossly intact. No sensory deficit noted. Strength at baseline in bilateral upper and lower extremities. Gait normal. Musculoskeletal: No warmth swelling or erythema around joints, no spinal tenderness noted. Psychiatric: Patient alert and oriented x3, good insight and cognition, good recent to remote recall. Lymph node survey: No cervical axillary or inguinal lymphadenopathy noted. Skin: Skin is warm and dry. No bruising, no ecchymosis and  no rash noted. Pt is not diaphoretic. No erythema. No pallor    Blood pressure 107/88, pulse 79, temperature 99.2 F (37.3 C), temperature source Oral, resp. rate 18, height 6' (1.829 m), weight 86.5 kg (190 lb 9.6 oz), SpO2 93 %.   Recent Labs  07/20/16 1609  NA 139  K 3.2*  CL 108  CO2 21*  GLUCOSE 107*  BUN 6  CREATININE 0.68  CALCIUM 9.0  MG 1.7    Recent Labs  07/20/16 1609  AST 45*  ALT 39  ALKPHOS 99  BILITOT 5.6*  PROT 7.6  ALBUMIN 4.1   No results for input(s): LIPASE, AMYLASE in the last 72 hours.  Recent Labs  07/20/16 1609  WBC 15.0*  NEUTROABS 11.1*  HGB 8.5*  HCT 25.1*  MCV 89.3  PLT 525*     Total time spent including face to face and decision making was greater than 30 minutes  Signed: Neytiri Asche A. 07/21/2016, 2:56 PM

## 2016-07-21 NOTE — Care Management CC44 (Signed)
Condition Code 44 Documentation Completed  Patient Details  Name: CONNAR KEATING MRN: [id number] Date of Birth: [birth year]   Condition Code 44 given:  Yes Patient signature on Condition Code 44 notice:  Yes Documentation of 2 MD's agreement:  Yes Code 44 added to claim:  Yes    Lynnell Catalan, RN 07/21/2016, 4:16 PM

## 2016-07-21 NOTE — H&P (Signed)
History and Physical  Patient Name: [name]     ENI:[id number]    DOB: 1980/04/26    DOA: 07/20/2016 PCP: Tresa Garter, MD   Patient coming from: Home  Chief Complaint: Pain crisis  HPI: [name] is a 35 y.o. male with a past medical history significant for sickle cell disease and pulmonary hypertension on home O2, chronic PE on Xarelto and HTN who presents with complaints of sickle cell pain.  The patient was in his normal state of health until this morning he woke up with pain in bilateral ribs.  This was severe, constant, and did not improve with his home MS Contin or short-acting pain medicines, so he came to the ER.  There was no associated fever, chills, cough, sputum, dyspnea.  ED course: -Afebrile, heart rate 76, respirations and pulse ox normal on home O2, BP 123/81 -Na 139, K 3.2, Cr 0.68, WBC 15K, Hgb 8.5 (baseline 7-9) -Troponin negative -CXR showed no focal opacity -ECG unchanged from baseline -He was given hydromorphone IV per protocol without relief and TRH were asked to evaluate for sickle cell pain crisis     ROS: Review of Systems  Musculoskeletal: Positive for joint pain (ribs).  All other systems reviewed and are negative.         Past Medical History:  Diagnosis Date  . Acute chest syndrome ([place]) 06/18/2013  . Acute embolism and thrombosis of right internal jugular vein ([place])   . Alcohol consumption of one to four drinks per day   . Avascular necrosis (HCC)    Right Hip  . Blood transfusion   . Chronic anticoagulation   . Demand ischemia ([place]) 01/02/2014  . Former smoker   . Functional asplenia   . Hb-SS disease with crisis ([place])   . History of Clostridium difficile infection   . History of pulmonary embolus (PE)   . Hypertension   . Hypokalemia   . Leukocytosis    Chronic  . Mood disorder ([place])   . Noncompliance with medication regimen   . Oxygen deficiency   . Pulmonary hypertension ([place])   . Second hand tobacco  smoke exposure   . Sickle cell anemia (HCC)   . Sickle-cell crisis with associated acute chest syndrome ([place]) 05/13/2013  . Stroke ([place])   . Thrombocytosis (HCC)    Chronic  . Uses marijuana     Past Surgical History:  Procedure Laterality Date  . CHOLECYSTECTOMY     01/2008  . Excision of left periauricular cyst     10/2009  . Excision of right ear lobe cyst with primary closur     11/2007  . Porta cath placement    . Porta cath removal    . PORTACATH PLACEMENT  01/05/2012   Procedure: INSERTION PORT-A-CATH;  Surgeon: Odis Hollingshead, MD;  Location: [facility];  Service: General;  Laterality: N/A;  ultrasound guiced port a cath insertion with fluoroscopy  . Right hip replacement     08/2006  . UMBILICAL HERNIA REPAIR     01/2008    Social History: Patient lives with his brothers.  The patient walks unassisted.  He does not smoke or use illicit drugs.    No Known Allergies  Family history: family history includes Depression in his mother; Diabetes in his mother; Sickle cell trait in his brother, father, and mother.  Prior to Admission medications   Medication Sig Start Date End Date Taking? Authorizing Provider  carvedilol (COREG) 3.125 MG tablet Take  1 tablet (3.125 mg total) by mouth 2 (two) times daily with a meal. 05/17/16  Yes [name], MD  cholecalciferol (VITAMIN D) 1000 units tablet Take 2,000 Units by mouth daily.   Yes [provider]  folic acid (FOLVITE) 1 MG tablet Take 2 tablets (2 mg total) by mouth daily. 04/25/16  Yes [name], MD  gabapentin (NEURONTIN) 300 MG capsule Take 1 capsule (300 mg total) by mouth 3 (three) times daily. 06/01/15  Yes Tresa Garter, MD  HYDROmorphone (DILAUDID) 4 MG tablet Take 1 tablet (4 mg total) by mouth every 4 (four) hours as needed for moderate pain or severe pain. 07/07/16 07/22/16 Yes Jegede, Marlena Clipper, MD  hydroxyurea (HYDREA) 500 MG capsule Take 500 mg by mouth every other day. May take with  food to minimize GI side effects. 06/25/16  Yes [provider]  hydroxyurea (HYDREA) 500 MG capsule Take 1,000 mg by mouth every other day. May take with food to minimize GI side effects. 06/24/16  Yes [provider]  L-glutamine (ENDARI) 5 g PACK Powder Packet Take 15 g by mouth 2 (two) times daily. 04/25/16  Yes [name], MD  lisinopril (PRINIVIL,ZESTRIL) 2.5 MG tablet Take 2.5 mg by mouth at bedtime.  05/22/16  Yes [provider]  morphine (MS CONTIN) 30 MG 12 hr tablet Take 1 tablet (30 mg total) by mouth every 12 (twelve) hours. 07/09/16 08/08/16 Yes Scot Jun, FNP  potassium chloride SA (K-DUR,KLOR-CON) 20 MEQ tablet Take 2 tablets (40 mEq total) by mouth daily. 04/27/16  Yes [name], MD  rivaroxaban (XARELTO) 20 MG TABS tablet Take 1 tablet (20 mg total) by mouth at bedtime. 06/23/16  Yes Dorena Dew, FNP  torsemide (DEMADEX) 20 MG tablet Take 1 tablet (20 mg total) by mouth daily as needed (leg swelling). 06/13/16  Yes [name], MD  zolpidem (AMBIEN) 10 MG tablet Take 1 tablet (10 mg total) by mouth at bedtime. 07/05/16 08/04/16 Yes Scot Jun, FNP       Physical Exam: BP 119/71 (BP Location: Right Arm)   Pulse 82   Temp 97.9 F (36.6 C) (Oral)   Resp (!) 21   Ht 6' (1.829 m)   Wt 86.5 kg (190 lb 9.6 oz)   SpO2 96%   BMI 25.85 kg/m  General appearance: Well-developed, adult male, alert and in no acute distress.   Eyes: Icteric, conjunctiva pink, lids and lashes normal. PERRL.    ENT: No nasal deformity, discharge, epistaxis.  Hearing normal. OP moist without lesions.   Neck: No neck masses.  Trachea midline.  No thyromegaly/tenderness. Lymph: No cervical or supraclavicular lymphadenopathy. Skin: Warm and dry.  No jaundice.  No suspicious rashes or lesions. Cardiac: RRR, nl S1-S2, no murmurs appreciated.  Capillary refill is brisk.  No LE edema.  Radial and DP pulses 2+ and symmetric. Respiratory: Normal  respiratory rate and rhythm.  CTAB without rales or wheezes. Abdomen: Abdomen soft.  No TTP. No ascites, distension, hepatosplenomegaly.   MSK: No deformities or effusions.  No cyanosis or clubbing. Neuro: Cranial nerves grossly normal.  Sensation intact to light touch. Speech is fluent.  Muscle strength normal.    Psych: Sensorium intact and responding to questions, attention normal.  Behavior appropriate.  Affect very flat.  Judgment and insight appear normal.     Labs on Admission:  I have personally reviewed following labs and imaging studies: CBC:  Recent Labs Lab 07/14/16 0816  07/17/16 0125 07/18/16 1052 07/20/16 1609  WBC 16.9* 12.1* 12.5* 15.0*  NEUTROABS 11.0* 7.9* 8.3* 11.1*  HGB 8.2* 8.2* 7.5* 8.5*  HCT 23.7* 24.2* 22.4* 25.1*  MCV 88.4 90.0 88.9 89.3  PLT 243 335 379 725*   Basic Metabolic Panel:  Recent Labs Lab 07/14/16 0816 07/17/16 0125 07/18/16 1052 07/20/16 1609  NA 138 137 139 139  K 3.6 3.8 3.7 3.2*  CL 106 108 108 108  CO2 25 23 27  21*  GLUCOSE 125* 115* 122* 107*  BUN 8 9 8 6   CREATININE 0.78 0.73 0.77 0.68  CALCIUM 8.6* 8.6* 8.5* 9.0  MG  --   --   --  1.7   GFR: Estimated Creatinine Clearance: 138.8 mL/min (by C-G formula based on SCr of 0.68 mg/dL).  Liver Function Tests:  Recent Labs Lab 07/14/16 0816 07/17/16 0125 07/18/16 1052 07/20/16 1609  AST 30 52* 41 45*  ALT 19 37 33 39  ALKPHOS 88 85 102 99  BILITOT 3.6* 4.4* 4.8* 5.6*  PROT 7.4 7.2 7.1 7.6  ALBUMIN 4.1 4.0 4.0 4.1   Anemia Panel:  Recent Labs  07/18/16 1052 07/20/16 1609  RETICCTPCT 9.8* 12.0*        Radiological Exams on Admission: Personally reviewed CXR shows massive CM, no focal airspace disease: Dg Chest 2 View  Result Date: 07/20/2016 CLINICAL DATA:  36 year old male with sickle cell disease. Bilateral rib and leg pain. EXAM: CHEST  2 VIEW COMPARISON:  Chest CTA 07/18/2016 and earlier. FINDINGS: Stable cardiomegaly and mediastinal contours. Stable  left subclavian Port-A-Cath, accessed. Stable to mildly improved lung volumes. No pneumothorax. Stable pulmonary vascularity without acute edema. No pleural effusion or confluent pulmonary opacity. Stable cholecystectomy clips. Negative visible bowel gas pattern. Stable visualized osseous structures, osteopenia and also H-type vertebral body changes better demonstrated on the recent CTA. IMPRESSION: Stable cardiomegaly. No acute cardiopulmonary abnormality. Electronically Signed   By: Genevie Ann M.D.   On: 07/20/2016 16:47    EKG: Independently reviewed. Rate 82, QTc 496, IVCD is old, lateral TWI are also old and unchanged.  Echocardiogram 2017: Report reviewed EF 55-60% PAP 78       Assessment/Plan Principal Problem:   Sickle cell pain crisis ([place]) Active Problems:   Essential hypertension   Chronic pain syndrome   Chronic respiratory failure with hypoxia (HCC)   PAH (pulmonary artery hypertension) (HCC)   Sickle cell anemia (HCC)   Cor pulmonale, chronic (HCC)    1. Vaso-occlussive sickle cell pain crisis: Will admit per sickle cell protocol for pain control.  No evidence of acute chest at this time. -Weight based high-dose IV hydromorphone PCA with continuous ETCO2 monitoring -Continue home MS Contin -IV Ketorolac not ordered given cardiomyopathy -Hydrate with IVF D5 .45% saline @ 100 cc/hr  -Continue hydroxyurea and folic acid -Diphenhydramine, ondansetron and bowel regimen    2. Sickle cell anemia:  Baseline Hgb 7-9 -Transfuse as needed if Hg drops significantly below baseline.  3. Hypokalemia:  -Continue home K -Check Mag  4. Chronic PE:  -Continue Xarelto  5. Pulmonary hypertension:  Stable  6. Cardiomyopathy:  -Continue lisinopril, carvedilol      DVT prophylaxis: N/A  Code Status: FULL  Family Communication: None present  Disposition Plan: Initiate IV hydromorphone PCA now, Sickle cell team to take over management in AM. Consults called:  None Admission status: INPATIENT, med surg         Medical decision making: Patient seen at 7:33 PM on 07/20/2016.  The  patient was discussed with Will Dansie, PA-C.  What exists of the patient's chart was reviewed in depth and summarized above.  Clinical condition: currently hemodynamically stable with continuous respiratory monitoring.        Edwin Dada [facility] Pager [phone]

## 2016-07-23 ENCOUNTER — Encounter (HOSPITAL_COMMUNITY): Payer: Self-pay

## 2016-07-23 ENCOUNTER — Emergency Department (HOSPITAL_COMMUNITY)
Admission: EM | Admit: 2016-07-23 | Discharge: 2016-07-24 | Disposition: A | Discharge status: Home / Self Care | Payer: Medicare Other | Source: Home / Self Care | Attending: Emergency Medicine | Admitting: Emergency Medicine

## 2016-07-23 DIAGNOSIS — Z7901 Long term (current) use of anticoagulants: Secondary | ICD-10-CM | POA: Diagnosis not present

## 2016-07-23 DIAGNOSIS — I10 Essential (primary) hypertension: Secondary | ICD-10-CM | POA: Diagnosis present

## 2016-07-23 DIAGNOSIS — R1114 Bilious vomiting: Secondary | ICD-10-CM | POA: Diagnosis not present

## 2016-07-23 DIAGNOSIS — Z96641 Presence of right artificial hip joint: Secondary | ICD-10-CM | POA: Diagnosis present

## 2016-07-23 DIAGNOSIS — J9611 Chronic respiratory failure with hypoxia: Secondary | ICD-10-CM | POA: Diagnosis not present

## 2016-07-23 DIAGNOSIS — F39 Unspecified mood [affective] disorder: Secondary | ICD-10-CM | POA: Diagnosis present

## 2016-07-23 DIAGNOSIS — Z86711 Personal history of pulmonary embolism: Secondary | ICD-10-CM

## 2016-07-23 DIAGNOSIS — G894 Chronic pain syndrome: Secondary | ICD-10-CM | POA: Diagnosis present

## 2016-07-23 DIAGNOSIS — I4510 Unspecified right bundle-branch block: Secondary | ICD-10-CM | POA: Diagnosis not present

## 2016-07-23 DIAGNOSIS — Z832 Family history of diseases of the blood and blood-forming organs and certain disorders involving the immune mechanism: Secondary | ICD-10-CM

## 2016-07-23 DIAGNOSIS — F1290 Cannabis use, unspecified, uncomplicated: Secondary | ICD-10-CM | POA: Diagnosis not present

## 2016-07-23 DIAGNOSIS — R55 Syncope and collapse: Secondary | ICD-10-CM | POA: Diagnosis not present

## 2016-07-23 DIAGNOSIS — Z87891 Personal history of nicotine dependence: Secondary | ICD-10-CM

## 2016-07-23 DIAGNOSIS — Q8901 Asplenia (congenital): Secondary | ICD-10-CM | POA: Diagnosis not present

## 2016-07-23 DIAGNOSIS — Z79899 Other long term (current) drug therapy: Secondary | ICD-10-CM

## 2016-07-23 DIAGNOSIS — E876 Hypokalemia: Secondary | ICD-10-CM | POA: Diagnosis not present

## 2016-07-23 DIAGNOSIS — D571 Sickle-cell disease without crisis: Secondary | ICD-10-CM | POA: Diagnosis present

## 2016-07-23 DIAGNOSIS — N179 Acute kidney failure, unspecified: Secondary | ICD-10-CM | POA: Diagnosis not present

## 2016-07-23 DIAGNOSIS — D5700 Hb-SS disease with crisis, unspecified: Secondary | ICD-10-CM

## 2016-07-23 DIAGNOSIS — I2721 Secondary pulmonary arterial hypertension: Secondary | ICD-10-CM | POA: Diagnosis present

## 2016-07-23 DIAGNOSIS — Z8673 Personal history of transient ischemic attack (TIA), and cerebral infarction without residual deficits: Secondary | ICD-10-CM

## 2016-07-23 DIAGNOSIS — Z9119 Patient's noncompliance with other medical treatment and regimen: Secondary | ICD-10-CM

## 2016-07-23 DIAGNOSIS — Z9981 Dependence on supplemental oxygen: Secondary | ICD-10-CM

## 2016-07-23 DIAGNOSIS — Z818 Family history of other mental and behavioral disorders: Secondary | ICD-10-CM

## 2016-07-23 DIAGNOSIS — Z833 Family history of diabetes mellitus: Secondary | ICD-10-CM

## 2016-07-23 DIAGNOSIS — I2781 Cor pulmonale (chronic): Secondary | ICD-10-CM | POA: Diagnosis present

## 2016-07-23 DIAGNOSIS — D638 Anemia in other chronic diseases classified elsewhere: Secondary | ICD-10-CM | POA: Diagnosis not present

## 2016-07-23 DIAGNOSIS — I2699 Other pulmonary embolism without acute cor pulmonale: Secondary | ICD-10-CM | POA: Diagnosis present

## 2016-07-23 DIAGNOSIS — Z9114 Patient's other noncompliance with medication regimen: Secondary | ICD-10-CM

## 2016-07-23 DIAGNOSIS — Z9049 Acquired absence of other specified parts of digestive tract: Secondary | ICD-10-CM

## 2016-07-23 LAB — COMPREHENSIVE METABOLIC PANEL
ALK PHOS: 97 U/L (ref 38–126)
ALT: 27 U/L (ref 17–63)
AST: 31 U/L (ref 15–41)
Albumin: 4.4 g/dL (ref 3.5–5.0)
Anion gap: 9 (ref 5–15)
BILIRUBIN TOTAL: 6.9 mg/dL — AB (ref 0.3–1.2)
BUN: 7 mg/dL (ref 6–20)
CALCIUM: 9 mg/dL (ref 8.9–10.3)
CO2: 20 mmol/L — ABNORMAL LOW (ref 22–32)
CREATININE: 0.76 mg/dL (ref 0.61–1.24)
Chloride: 110 mmol/L (ref 101–111)
GFR calc non Af Amer: >60 mL/min (ref >60)
GLUCOSE: 101 mg/dL — AB (ref 65–99)
Potassium: 3.4 mmol/L — ABNORMAL LOW (ref 3.5–5.1)
Sodium: 139 mmol/L (ref 135–145)
TOTAL PROTEIN: 7.8 g/dL (ref 6.5–8.1)

## 2016-07-23 LAB — CBC WITH DIFFERENTIAL/PLATELET
BASOS ABS: 0.1 10*3/uL (ref 0.0–0.1)
BASOS PCT: 0 %
EOS ABS: 0.1 10*3/uL (ref 0.0–0.7)
Eosinophils Relative: 1 %
HEMATOCRIT: 27.5 % — AB (ref 39.0–52.0)
Hemoglobin: 9.2 g/dL — ABNORMAL LOW (ref 13.0–17.0)
Lymphocytes Relative: 8 %
Lymphs Abs: 1.6 10*3/uL (ref 0.7–4.0)
MCH: 30.1 pg (ref 26.0–34.0)
MCHC: 33.5 g/dL (ref 30.0–36.0)
MCV: 89.9 fL (ref 78.0–100.0)
MONO ABS: 2.2 10*3/uL — AB (ref 0.1–1.0)
Monocytes Relative: 12 %
NEUTROS ABS: 15.1 10*3/uL — AB (ref 1.7–7.7)
NEUTROS PCT: 79 %
Platelets: 650 10*3/uL — ABNORMAL HIGH (ref 150–400)
RBC: 3.06 MIL/uL — ABNORMAL LOW (ref 4.22–5.81)
RDW: 19.1 % — AB (ref 11.5–15.5)
WBC: 19.1 10*3/uL — ABNORMAL HIGH (ref 4.0–10.5)

## 2016-07-23 LAB — RETICULOCYTES
RBC.: 3.06 MIL/uL — AB (ref 4.22–5.81)
RETIC COUNT ABSOLUTE: 327.4 10*3/uL — AB (ref 19.0–186.0)
RETIC CT PCT: 10.7 % — AB (ref 0.4–3.1)

## 2016-07-23 MED ORDER — HYDROMORPHONE HCL 1 MG/ML IJ SOLN
2.0000 mg | Freq: Once | INTRAMUSCULAR | Status: AC
  Administered 2016-07-23: 2 mg via INTRAVENOUS
  Filled 2016-07-23: qty 2

## 2016-07-23 MED ORDER — HEPARIN SOD (PORK) LOCK FLUSH 100 UNIT/ML IV SOLN
500.0000 [IU] | Freq: Once | INTRAVENOUS | Status: AC
  Administered 2016-07-24: 500 [IU]
  Filled 2016-07-23: qty 5

## 2016-07-23 MED ORDER — DIPHENHYDRAMINE HCL 50 MG/ML IJ SOLN
12.5000 mg | Freq: Once | INTRAMUSCULAR | Status: AC
  Administered 2016-07-23: 12.5 mg via INTRAVENOUS
  Filled 2016-07-23: qty 1

## 2016-07-23 NOTE — ED Triage Notes (Signed)
Pt c/o bilateral leg and rib pain. He reports that it is his typical sickle cell pain. He reports taking his prescribed MS Contin and Dilaudid 2 hours ago without relief. Denies chest pain or SOB.  Ambulatory. A&Ox4.

## 2016-07-23 NOTE — ED Provider Notes (Signed)
[facility] DEPT Provider Note   CSN: [id number] Arrival date & time: 07/23/16  2105     History   Chief Complaint Chief Complaint  Patient presents with  . Sickle Cell Pain Crisis    HPI [name] is a 36 y.o. male.  36 year old male with history of sickle cell disease well-known to me who presents with his usual pain crisis. States he's been compliant with his home regimen of opiates. Also has been compliant using his home oxygen. Denies any fever or chills. No cough or congestion. No urinary symptoms. No neurological findings. Is unsure of what precipitated this attack. Was discharged from the hospital 2 days ago and that visit was reviewed and patient was felt to be suffering mostly from chronic pain as opposed to vaso-occlusive crisis at that time.      Past Medical History:  Diagnosis Date  . Acute chest syndrome ([place]) 06/18/2013  . Acute embolism and thrombosis of right internal jugular vein ([place])   . Alcohol consumption of one to four drinks per day   . Avascular necrosis (HCC)    Right Hip  . Blood transfusion   . Chronic anticoagulation   . Demand ischemia ([place]) 01/02/2014  . Former smoker   . Functional asplenia   . Hb-SS disease with crisis ([place])   . History of Clostridium difficile infection   . History of pulmonary embolus (PE)   . Hypertension   . Hypokalemia   . Leukocytosis    Chronic  . Mood disorder ([place])   . Noncompliance with medication regimen   . Oxygen deficiency   . Pulmonary hypertension ([place])   . Second hand tobacco smoke exposure   . Sickle cell anemia (HCC)   . Sickle-cell crisis with associated acute chest syndrome ([name]) 05/13/2013  . Stroke ([place])   . Thrombocytosis (HCC)    Chronic  . Uses marijuana     Patient Active Problem List   Diagnosis Date Noted  . Acute pain 07/21/2016  . Sickle cell crisis ([place]) 02/23/2016  . H/O arterial ischemic stroke 11/11/2015  . Slurred speech 11/10/2015  . Chronic anemia   . Hb-SS  disease without crisis ([place]) 10/07/2014  . Prolonged Q-T interval on ECG 09/10/2014  . Cor pulmonale, chronic ([place]) 08/25/2014  . Sickle cell anemia ([place]) 06/25/2014  . Anemia of chronic disease 06/25/2014  . PAH (pulmonary artery hypertension) ([place]) 03/18/2014  . Chronic respiratory failure with hypoxia ([place]) 03/14/2014  . Paralytic strabismus, external ophthalmoplegia   . Chronic pain syndrome 12/12/2013  . Chronic anticoagulation 08/22/2013  . Essential hypertension 08/22/2013  . Pulmonary HTN ([place]) 06/18/2013  . Functional asplenia   . Vitamin D deficiency 02/13/2013  . Hx of pulmonary embolus 06/29/2012  . 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 Avascular necrosis [facility])     Past Surgical History:  Procedure Laterality Date  . CHOLECYSTECTOMY     01/2008  . Excision of left periauricular cyst     10/2009  . Excision of right ear lobe cyst with primary closur     11/2007  . Porta cath placement    . Porta cath removal    . PORTACATH PLACEMENT  01/05/2012   Procedure: INSERTION PORT-A-CATH;  Surgeon: Odis Hollingshead, MD;  Location: [facility];  Service: General;  Laterality: N/A;  ultrasound guiced port a cath insertion with fluoroscopy  . Right hip replacement     08/2006  . UMBILICAL HERNIA REPAIR     01/2008  Home Medications    Prior to Admission medications   Medication Sig Start Date End Date Taking? Authorizing Provider  carvedilol (COREG) 3.125 MG tablet Take 1 tablet (3.125 mg total) by mouth 2 (two) times daily with a meal. 05/17/16  Yes [name], MD  cholecalciferol (VITAMIN D) 1000 units tablet Take 2,000 Units by mouth daily.   Yes [provider]  folic acid (FOLVITE) 1 MG tablet Take 2 tablets (2 mg total) by mouth daily. 04/25/16  Yes [name], MD  gabapentin (NEURONTIN) 300 MG capsule Take 1 capsule (300 mg total) by mouth 3 (three) times daily. 06/01/15  Yes Jegede, Marlena Clipper, MD  hydroxyurea (HYDREA) 500 MG capsule  Take 500 mg by mouth every other day. May take with food to minimize GI side effects. 06/25/16  Yes [provider]  hydroxyurea (HYDREA) 500 MG capsule Take 1,000 mg by mouth every other day. May take with food to minimize GI side effects. 06/24/16  Yes [provider]  L-glutamine (ENDARI) 5 g PACK Powder Packet Take 15 g by mouth 2 (two) times daily. 04/25/16  Yes [name], MD  lisinopril (PRINIVIL,ZESTRIL) 2.5 MG tablet Take 2.5 mg by mouth at bedtime.  05/22/16  Yes [provider]  morphine (MS CONTIN) 30 MG 12 hr tablet Take 1 tablet (30 mg total) by mouth every 12 (twelve) hours. 07/09/16 08/08/16 Yes Scot Jun, FNP  potassium chloride SA (K-DUR,KLOR-CON) 20 MEQ tablet Take 2 tablets (40 mEq total) by mouth daily. 04/27/16  Yes [name], MD  rivaroxaban (XARELTO) 20 MG TABS tablet Take 1 tablet (20 mg total) by mouth at bedtime. 06/23/16  Yes Dorena Dew, FNP  zolpidem (AMBIEN) 10 MG tablet Take 1 tablet (10 mg total) by mouth at bedtime. Patient taking differently: Take 10 mg by mouth at bedtime as needed for sleep.  07/05/16 08/04/16 Yes Scot Jun, FNP  torsemide (DEMADEX) 20 MG tablet Take 1 tablet (20 mg total) by mouth daily as needed (leg swelling). 06/13/16   [name], MD    Family History Family History  Problem Relation Age of Onset  . Sickle cell trait Mother   . Depression Mother   . Diabetes Mother   . Sickle cell trait Father   . Sickle cell trait Brother     Social History Social History  Substance Use Topics  . Smoking status: Former Smoker    Packs/day: 0.50    Years: 10.00    Types: Cigarettes    Quit date: 05/29/2011  . Smokeless tobacco: Never Used  . Alcohol use No     Allergies   Patient has no known allergies.   Review of Systems Review of Systems  All other systems reviewed and are negative.    Physical Exam Updated Vital Signs BP 112/89 (BP Location: Left Arm)   Pulse  88   Temp 98.2 F (36.8 C) (Oral)   Resp 16   Ht 1.829 m (6')   Wt 88.5 kg (195 lb)   SpO2 98%   BMI 26.45 kg/m   Physical Exam  Constitutional: He is oriented to person, place, and time. He appears well-developed and well-nourished.  Non-toxic appearance. No distress.  HENT:  Head: Normocephalic and atraumatic.  Eyes: Conjunctivae, EOM and lids are normal. Pupils are equal, round, and reactive to light.  Neck: Normal range of motion. Neck supple. No tracheal deviation present. No thyroid mass present.  Cardiovascular: Normal rate, regular  rhythm and normal heart sounds.  Exam reveals no gallop.   No murmur heard. Pulmonary/Chest: Effort normal and breath sounds normal. No stridor. No respiratory distress. He has no decreased breath sounds. He has no wheezes. He has no rhonchi. He has no rales.  Abdominal: Soft. Normal appearance and bowel sounds are normal. He exhibits no distension. There is no tenderness. There is no rebound and no CVA tenderness.  Musculoskeletal: Normal range of motion. He exhibits no edema or tenderness.  Neurological: He is alert and oriented to person, place, and time. He has normal strength. No cranial nerve deficit or sensory deficit. GCS eye subscore is 4. GCS verbal subscore is 5. GCS motor subscore is 6.  Skin: Skin is warm and dry. No abrasion and no rash noted.  Psychiatric: He has a normal mood and affect. His speech is normal and behavior is normal.  Nursing note and vitals reviewed.    ED Treatments / Results  Labs (all labs ordered are listed, but only abnormal results are displayed) Labs Reviewed  COMPREHENSIVE METABOLIC PANEL  CBC WITH DIFFERENTIAL/PLATELET  RETICULOCYTES    EKG  EKG Interpretation None       Radiology No results found.  Procedures Procedures (including critical care time)  Medications Ordered in ED Medications  HYDROmorphone (DILAUDID) injection 2 mg (not administered)  diphenhydrAMINE (BENADRYL) injection  12.5 mg (not administered)     Initial Impression / Assessment and Plan / ED Course  I have reviewed the triage vital signs and the nursing notes.  Pertinent labs & imaging results that were available during my care of the patient were reviewed by me and considered in my medical decision making (see chart for details).     Patient medicated here for likely chronic pain exacerbation. He is afebrile and has a chronic leukocytosis He feels will not go home at this time he is encouraged to drink plenty of liquids and to take his home meds  Final Clinical Impressions(s) / ED Diagnoses   Final diagnoses:  None    New Prescriptions New Prescriptions   No medications on file     Lacretia Leigh, MD 07/23/16 2333

## 2016-07-23 NOTE — ED Notes (Signed)
Pt c/o 8/10 sickle cell pain in the ribs and legs onset today around 11:00. Pt has tried oxycontin and dilaudid with little effect.

## 2016-07-26 ENCOUNTER — Emergency Department (HOSPITAL_COMMUNITY): Payer: Medicare Other

## 2016-07-26 ENCOUNTER — Inpatient Hospital Stay (HOSPITAL_COMMUNITY): Payer: Medicare Other

## 2016-07-26 ENCOUNTER — Inpatient Hospital Stay (HOSPITAL_COMMUNITY)
Admission: EM | Admit: 2016-07-26 | Discharge: 2016-07-28 | DRG: 176 | Disposition: A | Discharge status: Home / Self Care | Payer: Medicare Other | Attending: Internal Medicine | Admitting: Internal Medicine

## 2016-07-26 ENCOUNTER — Encounter (HOSPITAL_COMMUNITY): Payer: Self-pay | Admitting: Emergency Medicine

## 2016-07-26 ENCOUNTER — Ambulatory Visit: Payer: Self-pay | Admitting: Family Medicine

## 2016-07-26 DIAGNOSIS — I4510 Unspecified right bundle-branch block: Secondary | ICD-10-CM | POA: Diagnosis not present

## 2016-07-26 DIAGNOSIS — F39 Unspecified mood [affective] disorder: Secondary | ICD-10-CM | POA: Diagnosis not present

## 2016-07-26 DIAGNOSIS — F1290 Cannabis use, unspecified, uncomplicated: Secondary | ICD-10-CM | POA: Diagnosis not present

## 2016-07-26 DIAGNOSIS — R109 Unspecified abdominal pain: Secondary | ICD-10-CM | POA: Diagnosis not present

## 2016-07-26 DIAGNOSIS — I2699 Other pulmonary embolism without acute cor pulmonale: Secondary | ICD-10-CM

## 2016-07-26 DIAGNOSIS — D5700 Hb-SS disease with crisis, unspecified: Secondary | ICD-10-CM

## 2016-07-26 DIAGNOSIS — J9611 Chronic respiratory failure with hypoxia: Secondary | ICD-10-CM | POA: Diagnosis not present

## 2016-07-26 DIAGNOSIS — D638 Anemia in other chronic diseases classified elsewhere: Secondary | ICD-10-CM | POA: Diagnosis not present

## 2016-07-26 DIAGNOSIS — I10 Essential (primary) hypertension: Secondary | ICD-10-CM | POA: Diagnosis not present

## 2016-07-26 DIAGNOSIS — G894 Chronic pain syndrome: Secondary | ICD-10-CM

## 2016-07-26 DIAGNOSIS — I2781 Cor pulmonale (chronic): Secondary | ICD-10-CM | POA: Diagnosis not present

## 2016-07-26 DIAGNOSIS — I959 Hypotension, unspecified: Secondary | ICD-10-CM | POA: Diagnosis not present

## 2016-07-26 DIAGNOSIS — Q8901 Asplenia (congenital): Secondary | ICD-10-CM | POA: Diagnosis not present

## 2016-07-26 DIAGNOSIS — D571 Sickle-cell disease without crisis: Secondary | ICD-10-CM | POA: Diagnosis not present

## 2016-07-26 DIAGNOSIS — R1114 Bilious vomiting: Secondary | ICD-10-CM | POA: Diagnosis not present

## 2016-07-26 DIAGNOSIS — Z7901 Long term (current) use of anticoagulants: Secondary | ICD-10-CM | POA: Diagnosis not present

## 2016-07-26 DIAGNOSIS — R55 Syncope and collapse: Secondary | ICD-10-CM | POA: Diagnosis not present

## 2016-07-26 DIAGNOSIS — Z9114 Patient's other noncompliance with medication regimen: Secondary | ICD-10-CM | POA: Diagnosis not present

## 2016-07-26 DIAGNOSIS — R079 Chest pain, unspecified: Secondary | ICD-10-CM | POA: Diagnosis not present

## 2016-07-26 DIAGNOSIS — K2970 Gastritis, unspecified, without bleeding: Secondary | ICD-10-CM | POA: Diagnosis not present

## 2016-07-26 DIAGNOSIS — N179 Acute kidney failure, unspecified: Secondary | ICD-10-CM | POA: Diagnosis not present

## 2016-07-26 DIAGNOSIS — R112 Nausea with vomiting, unspecified: Secondary | ICD-10-CM | POA: Diagnosis not present

## 2016-07-26 DIAGNOSIS — I2721 Secondary pulmonary arterial hypertension: Secondary | ICD-10-CM | POA: Diagnosis not present

## 2016-07-26 DIAGNOSIS — E876 Hypokalemia: Secondary | ICD-10-CM | POA: Diagnosis not present

## 2016-07-26 DIAGNOSIS — Z9049 Acquired absence of other specified parts of digestive tract: Secondary | ICD-10-CM | POA: Diagnosis not present

## 2016-07-26 LAB — COMPREHENSIVE METABOLIC PANEL
ALBUMIN: 4.1 g/dL (ref 3.5–5.0)
ALK PHOS: 88 U/L (ref 38–126)
ALT: 22 U/L (ref 17–63)
AST: 34 U/L (ref 15–41)
Anion gap: 7 (ref 5–15)
BILIRUBIN TOTAL: 7.8 mg/dL — AB (ref 0.3–1.2)
BUN: 9 mg/dL (ref 6–20)
CALCIUM: 8.9 mg/dL (ref 8.9–10.3)
CO2: 20 mmol/L — AB (ref 22–32)
CREATININE: 1.23 mg/dL (ref 0.61–1.24)
Chloride: 109 mmol/L (ref 101–111)
GFR calc Af Amer: >60 mL/min (ref >60)
GFR calc non Af Amer: >60 mL/min (ref >60)
GLUCOSE: 130 mg/dL — AB (ref 65–99)
Potassium: 3.3 mmol/L — ABNORMAL LOW (ref 3.5–5.1)
SODIUM: 136 mmol/L (ref 135–145)
Total Protein: 7.7 g/dL (ref 6.5–8.1)

## 2016-07-26 LAB — I-STAT TROPONIN, ED
TROPONIN I, POC: 0.03 ng/mL (ref 0.00–0.08)
Troponin i, poc: 0.27 ng/mL (ref 0.00–0.08)

## 2016-07-26 LAB — URINALYSIS, ROUTINE W REFLEX MICROSCOPIC
BILIRUBIN URINE: NEGATIVE
Glucose, UA: NEGATIVE mg/dL
HGB URINE DIPSTICK: NEGATIVE
Ketones, ur: NEGATIVE mg/dL
Leukocytes, UA: NEGATIVE
Nitrite: NEGATIVE
PH: 7 (ref 5.0–8.0)
Protein, ur: NEGATIVE mg/dL
SPECIFIC GRAVITY, URINE: 1.039 — AB (ref 1.005–1.030)

## 2016-07-26 LAB — CBC WITH DIFFERENTIAL/PLATELET
BASOS PCT: 0 %
Basophils Absolute: 0 10*3/uL (ref 0.0–0.1)
EOS ABS: 0.3 10*3/uL (ref 0.0–0.7)
Eosinophils Relative: 1 %
HCT: 28.8 % — ABNORMAL LOW (ref 39.0–52.0)
HEMOGLOBIN: 9.5 g/dL — AB (ref 13.0–17.0)
LYMPHS ABS: 2 10*3/uL (ref 0.7–4.0)
LYMPHS PCT: 7 %
MCH: 30 pg (ref 26.0–34.0)
MCHC: 33 g/dL (ref 30.0–36.0)
MCV: 90.9 fL (ref 78.0–100.0)
MONO ABS: 2.3 10*3/uL — AB (ref 0.1–1.0)
Monocytes Relative: 8 %
NEUTROS ABS: 24.2 10*3/uL — AB (ref 1.7–7.7)
Neutrophils Relative %: 84 %
Platelets: 542 10*3/uL — ABNORMAL HIGH (ref 150–400)
RBC: 3.17 MIL/uL — ABNORMAL LOW (ref 4.22–5.81)
RDW: 18.5 % — AB (ref 11.5–15.5)
WBC: 28.8 10*3/uL — ABNORMAL HIGH (ref 4.0–10.5)

## 2016-07-26 LAB — PROTIME-INR
INR: 3.29
PROTHROMBIN TIME: 34.2 s — AB (ref 11.4–15.2)

## 2016-07-26 LAB — I-STAT CG4 LACTIC ACID, ED
Lactic Acid, Venous: 0.9 mmol/L (ref 0.5–1.9)
Lactic Acid, Venous: 0.49 mmol/L — ABNORMAL LOW (ref 0.5–1.9)

## 2016-07-26 LAB — TYPE AND SCREEN
ABO/RH(D): O POS
ABO/RH(D): O POS
Antibody Screen: NEGATIVE
Antibody Screen: NEGATIVE

## 2016-07-26 LAB — ECHOCARDIOGRAM COMPLETE
HEIGHTINCHES: 72 in
WEIGHTICAEL: 2948.87 [oz_av]

## 2016-07-26 LAB — RETICULOCYTES
RBC.: 3.17 MIL/uL — ABNORMAL LOW (ref 4.22–5.81)
RETIC CT PCT: 11 % — AB (ref 0.4–3.1)
Retic Count, Absolute: 348.7 10*3/uL — ABNORMAL HIGH (ref 19.0–186.0)

## 2016-07-26 LAB — TROPONIN I: TROPONIN I: 0.25 ng/mL — AB (ref <0.03)

## 2016-07-26 LAB — HEPARIN LEVEL (UNFRACTIONATED): Heparin Unfractionated: 0.96 IU/mL — ABNORMAL HIGH (ref 0.30–0.70)

## 2016-07-26 LAB — APTT
APTT: 55 s — AB (ref 24–36)
aPTT: 56 seconds — ABNORMAL HIGH (ref 24–36)

## 2016-07-26 LAB — BRAIN NATRIURETIC PEPTIDE: B NATRIURETIC PEPTIDE 5: 494.3 pg/mL — AB (ref 0.0–100.0)

## 2016-07-26 LAB — LIPASE, BLOOD: Lipase: 40 U/L (ref 11–51)

## 2016-07-26 MED ORDER — POTASSIUM CHLORIDE CRYS ER 20 MEQ PO TBCR
40.0000 meq | EXTENDED_RELEASE_TABLET | Freq: Every day | ORAL | Status: DC
  Administered 2016-07-26 – 2016-07-28 (×3): 40 meq via ORAL
  Filled 2016-07-26 (×3): qty 2

## 2016-07-26 MED ORDER — FENTANYL CITRATE (PF) 100 MCG/2ML IJ SOLN
INTRAMUSCULAR | Status: AC
  Filled 2016-07-26: qty 2

## 2016-07-26 MED ORDER — LISINOPRIL 5 MG PO TABS
2.5000 mg | ORAL_TABLET | Freq: Every day | ORAL | Status: DC
  Administered 2016-07-26 – 2016-07-27 (×2): 2.5 mg via ORAL
  Filled 2016-07-26 (×2): qty 1

## 2016-07-26 MED ORDER — HYDROXYUREA 500 MG PO CAPS
500.0000 mg | ORAL_CAPSULE | ORAL | Status: DC
  Administered 2016-07-26 – 2016-07-28 (×2): 500 mg via ORAL
  Filled 2016-07-26 (×2): qty 1

## 2016-07-26 MED ORDER — HEPARIN (PORCINE) IN NACL 100-0.45 UNIT/ML-% IJ SOLN
1400.0000 [IU]/h | INTRAMUSCULAR | Status: DC
  Administered 2016-07-26: 1400 [IU]/h via INTRAVENOUS
  Filled 2016-07-26: qty 250

## 2016-07-26 MED ORDER — HEPARIN (PORCINE) IN NACL 100-0.45 UNIT/ML-% IJ SOLN
1550.0000 [IU]/h | INTRAMUSCULAR | Status: DC
  Administered 2016-07-26: 1550 [IU]/h via INTRAVENOUS
  Filled 2016-07-26: qty 250

## 2016-07-26 MED ORDER — MORPHINE SULFATE ER 30 MG PO TBCR
30.0000 mg | EXTENDED_RELEASE_TABLET | Freq: Two times a day (BID) | ORAL | Status: DC
  Administered 2016-07-26 – 2016-07-28 (×5): 30 mg via ORAL
  Filled 2016-07-26 (×5): qty 1

## 2016-07-26 MED ORDER — SODIUM CHLORIDE 0.9% FLUSH
3.0000 mL | Freq: Two times a day (BID) | INTRAVENOUS | Status: DC
  Administered 2016-07-27 – 2016-07-28 (×2): 3 mL via INTRAVENOUS

## 2016-07-26 MED ORDER — GABAPENTIN 300 MG PO CAPS
300.0000 mg | ORAL_CAPSULE | Freq: Three times a day (TID) | ORAL | Status: DC
  Administered 2016-07-26 – 2016-07-28 (×7): 300 mg via ORAL
  Filled 2016-07-26 (×7): qty 1

## 2016-07-26 MED ORDER — CARVEDILOL 3.125 MG PO TABS
3.1250 mg | ORAL_TABLET | Freq: Two times a day (BID) | ORAL | Status: DC
  Administered 2016-07-26 – 2016-07-28 (×5): 3.125 mg via ORAL
  Filled 2016-07-26 (×5): qty 1

## 2016-07-26 MED ORDER — L-GLUTAMINE ORAL POWDER
15.0000 g | PACK | Freq: Two times a day (BID) | ORAL | Status: DC
  Administered 2016-07-26 – 2016-07-28 (×4): 15 g via ORAL
  Filled 2016-07-26 (×6): qty 3

## 2016-07-26 MED ORDER — SODIUM CHLORIDE 0.9% FLUSH
10.0000 mL | INTRAVENOUS | Status: DC | PRN
  Administered 2016-07-26 – 2016-07-28 (×2): 10 mL
  Filled 2016-07-26 (×2): qty 40

## 2016-07-26 MED ORDER — HYDROMORPHONE HCL 4 MG PO TABS
4.0000 mg | ORAL_TABLET | ORAL | Status: DC | PRN
  Administered 2016-07-26 – 2016-07-28 (×10): 4 mg via ORAL
  Filled 2016-07-26 (×11): qty 1

## 2016-07-26 MED ORDER — FOLIC ACID 1 MG PO TABS
2.0000 mg | ORAL_TABLET | Freq: Every day | ORAL | Status: DC
  Administered 2016-07-26 – 2016-07-28 (×3): 2 mg via ORAL
  Filled 2016-07-26 (×3): qty 2

## 2016-07-26 MED ORDER — DEXTROSE-NACL 5-0.45 % IV SOLN
INTRAVENOUS | Status: DC
  Administered 2016-07-26 – 2016-07-28 (×6): via INTRAVENOUS

## 2016-07-26 MED ORDER — HYDROMORPHONE HCL 1 MG/ML IJ SOLN
1.0000 mg | INTRAMUSCULAR | Status: DC | PRN
  Administered 2016-07-26 (×2): 1 mg via INTRAVENOUS
  Filled 2016-07-26 (×2): qty 1

## 2016-07-26 MED ORDER — OXYCODONE-ACETAMINOPHEN 5-325 MG PO TABS
1.0000 | ORAL_TABLET | Freq: Once | ORAL | Status: AC
  Administered 2016-07-27: 1 via ORAL
  Filled 2016-07-26: qty 1

## 2016-07-26 MED ORDER — FENTANYL CITRATE (PF) 100 MCG/2ML IJ SOLN
50.0000 ug | Freq: Once | INTRAMUSCULAR | Status: AC
  Administered 2016-07-26: 50 ug via INTRAVENOUS

## 2016-07-26 MED ORDER — ALBUTEROL SULFATE (2.5 MG/3ML) 0.083% IN NEBU
2.5000 mg | INHALATION_SOLUTION | RESPIRATORY_TRACT | Status: DC | PRN
Start: 2016-07-26 — End: 2016-07-26

## 2016-07-26 MED ORDER — ZOLPIDEM TARTRATE 10 MG PO TABS
10.0000 mg | ORAL_TABLET | Freq: Every evening | ORAL | Status: DC | PRN
  Administered 2016-07-27: 10 mg via ORAL
  Filled 2016-07-26: qty 1

## 2016-07-26 MED ORDER — HYDROXYUREA 500 MG PO CAPS
1000.0000 mg | ORAL_CAPSULE | ORAL | Status: DC
  Administered 2016-07-27: 1000 mg via ORAL
  Filled 2016-07-26: qty 2

## 2016-07-26 MED ORDER — FENTANYL CITRATE (PF) 100 MCG/2ML IJ SOLN: 50.0000 ug | INTRAMUSCULAR | Status: DC | PRN

## 2016-07-26 MED ORDER — SODIUM CHLORIDE 0.45 % IV SOLN: INTRAVENOUS | Status: DC

## 2016-07-26 MED ORDER — IOPAMIDOL (ISOVUE-370) INJECTION 76%
INTRAVENOUS | Status: AC
  Administered 2016-07-26: 100 mL
  Filled 2016-07-26: qty 100

## 2016-07-26 MED ORDER — VITAMIN D3 25 MCG (1000 UNIT) PO TABS
2000.0000 [IU] | ORAL_TABLET | Freq: Every day | ORAL | Status: DC
  Administered 2016-07-26 – 2016-07-28 (×3): 2000 [IU] via ORAL
  Filled 2016-07-26 (×3): qty 2

## 2016-07-26 MED ORDER — SODIUM CHLORIDE 0.9 % IV BOLUS (SEPSIS)
1000.0000 mL | Freq: Once | INTRAVENOUS | Status: AC
  Administered 2016-07-26: 1000 mL via INTRAVENOUS

## 2016-07-26 MED ORDER — HEPARIN BOLUS VIA INFUSION
2000.0000 [IU] | Freq: Once | INTRAVENOUS | Status: AC
  Administered 2016-07-26: 2000 [IU] via INTRAVENOUS
  Filled 2016-07-26: qty 2000

## 2016-07-26 MED ORDER — DIPHENHYDRAMINE HCL 50 MG/ML IJ SOLN
25.0000 mg | Freq: Four times a day (QID) | INTRAMUSCULAR | Status: DC | PRN
  Administered 2016-07-26: 25 mg via INTRAVENOUS
  Filled 2016-07-26: qty 1

## 2016-07-26 NOTE — ED Notes (Signed)
EMTALA reviewed by Charge RN [place].

## 2016-07-26 NOTE — ED Notes (Signed)
Dr Ferne Coe at bedside

## 2016-07-26 NOTE — Progress Notes (Signed)
ANTICOAGULATION CONSULT NOTE - Follow Up Consult  Pharmacy Consult for Heparin Indication: pulmonary embolus  No Known Allergies  Patient Measurements: Height: 6' (182.9 cm) Weight: 184 lb 4.9 oz (83.6 kg) IBW/kg (Calculated) : 77.6  Vital Signs: Temp: 98.9 F (37.2 C) (06/26 1044) Temp Source: Oral (06/26 1044) BP: 119/63 (06/26 1044) Pulse Rate: 84 (06/26 1044)  Labs:  Recent Labs  07/23/16 2216 07/26/16 0119 07/26/16 0457  HGB 9.2* 9.5*  --   HCT 27.5* 28.8*  --   PLT 650* 542*  --   APTT  --  55*  --   LABPROT  --  34.2*  --   INR  --  3.29  --   CREATININE 0.76 1.23  --   TROPONINI  --   --  0.25*    Estimated Creatinine Clearance: 90.3 mL/min (by C-G formula based on SCr of 1.23 mg/dL).    Assessment: 18 yoM with sickle cell disease on Xarelto PTA admitted with acute PE.  MD documents non-compliance with Xarelto.  Placed on heparin infusion.  Cardiology recommends resuming Xarelto at discharge. Per discussion with Dr. Zigmund Daniel, continuing heparin infusion for now while working up the syncopal episode and elevated troponin.  Monitoring aPTT with heparin levels since patient reported taking Xarelto on 6/25 and this can affect heparin level; however, do not have a baseline heparin level to evaluate.  Will check heparin level with first aPTT draw.  First heparin level and aPTT pending following 2000 unit bolus and infusion started at 1400 units/hr.  Goal of Therapy:  APTT 66-102 while Xarelto affecting heparin level  Heparin level 0.3-0.7 units/ml Monitor platelets by anticoagulation protocol: Yes   Plan:  F/u aPTT and heparin level. Continuing heparin infusion at 1400 units/hr.  Hershal Coria 07/26/2016,2:56 PM

## 2016-07-26 NOTE — ED Triage Notes (Signed)
BIB EMS from home, pt had sudden onset of N/V and bilateral rib pain. Pt hypotensive en route and low O2 sats. Per EMS pt lethargic, he is A/OX4 just slow to respond. EDP at bedside.

## 2016-07-26 NOTE — ED Notes (Signed)
Pt transported to CT ?

## 2016-07-26 NOTE — ED Notes (Signed)
[facility] at bedside, all transfer paperwork given to [facility] prior to transfer of patient.

## 2016-07-26 NOTE — Consult Note (Signed)
Cardiology Consultation:   Patient ID: [name]; [id number]; 1980/08/29   Admit date: 07/26/2016 Date of Consult: 07/26/2016  Primary Care Provider: Tresa Garter, MD Primary Cardiologist: None Primary Electrophysiologist:  none   Patient Profile:   [name] is a 36 y.o. male with a hx of sickle cell disease, chronic oxygen requirements 4L/m [place], hx of demand ischemia, hypertension, pulmonary hypertension, stroke.  [name] is being seen today for the evaluation of prolonged QT interval and syncope at the request of Dr. Zigmund [name].  History of Present Illness:   [name] was brought to the ER by at [facility] for bilateral rib pain and epigastric pain that started at 12:30 am this morning. Per EMS his pressure of 60/40 and [name] was sluggish. They placed him in Trendelenburg which improved mental status and BP.  [name] was found to have stable hemoglobin at 9.5, WBC 28.8, INR 3.29, elevated Troponin I 0.25,  AKI creatinine is 1.23. CT angio performed and [name] is found to have multiple segmental acute pulmonary emboli. Stable severe cardiomegaly, enlarged main artery hypertension, mild pulmonary edema. [name] has been started on Heparin and transferred to [facility] to be managed by Dr. Zigmund [name] who is familiar with patient.   Patient is supposed to be on Xarelto and oxygen at home but has been non complaint.  Apparently [name]  had a syncopal episode. Admitting physician concern for prolonged QTc interval and syncope. EKG HR 94 with a right bundle branch block.    Past Medical History:  Diagnosis Date  . Acute chest syndrome ([place]) 06/18/2013  . Acute embolism and thrombosis of right internal jugular vein ([place])   . Alcohol consumption of one to four drinks per day   . Avascular necrosis (HCC)    Right Hip  . Blood transfusion   . Chronic anticoagulation   . Demand ischemia ([place]) 01/02/2014  . Former smoker   . Functional asplenia   . Hb-SS disease with crisis  ([place])   . History of Clostridium difficile infection   . History of pulmonary embolus (PE)   . Hypertension   . Hypokalemia   . Leukocytosis    Chronic  . Mood disorder ([place])   . Noncompliance with medication regimen   . Oxygen deficiency   . Pulmonary hypertension ([place])   . Second hand tobacco smoke exposure   . Sickle cell anemia (HCC)   . Sickle-cell crisis with associated acute chest syndrome ([place]) 05/13/2013  . Stroke ([place])   . Thrombocytosis (HCC)    Chronic  . Uses marijuana     Past Surgical History:  Procedure Laterality Date  . CHOLECYSTECTOMY     01/2008  . Excision of left periauricular cyst     10/2009  . Excision of right ear lobe cyst with primary closur     11/2007  . Porta cath placement    . Porta cath removal    . PORTACATH PLACEMENT  01/05/2012   Procedure: INSERTION PORT-A-CATH;  Surgeon: Gerald Hollingshead, MD;  Location: [place];  Service: General;  Laterality: N/A;  ultrasound guiced port a cath insertion with fluoroscopy  . Right hip replacement     08/2006  . UMBILICAL HERNIA REPAIR     01/2008     Inpatient Medications: Scheduled Meds: . carvedilol  3.125 mg Oral BID WC  . cholecalciferol  2,000 Units Oral Daily  . folic acid  2 mg Oral Daily  . gabapentin  300 mg  Oral TID  . [START ON 07/27/2016] hydroxyurea  1,000 mg Oral QODAY  . hydroxyurea  500 mg Oral QODAY  . L-glutamine  15 g Oral BID  . lisinopril  2.5 mg Oral QHS  . morphine  30 mg Oral Q12H  . potassium chloride SA  40 mEq Oral Daily  . sodium chloride flush  3 mL Intravenous Q12H   Continuous Infusions: . dextrose 5 % and 0.45% NaCl 100 mL/hr at 07/26/16 1159  . heparin 1,400 Units/hr (07/26/16 0611)   PRN Meds: HYDROmorphone, zolpidem  Allergies:   No Known Allergies  Social History:   Social History   Social History  . Marital status: Single    Spouse name: N/A  . Number of children: 0  . Years of education: 38   Occupational History  . 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 Smoking status: Former Smoker    Packs/day: 0.50    Years: 10.00    Types: Cigarettes    Quit date: 05/29/2011  . Smokeless tobacco: Never Used  . Alcohol use No  . Drug use: No     Comment: Marijuana weekly  . Sexual activity: Yes    Partners: Female     Comment: month ago   Other Topics Concern  . Not on file   Social History Narrative   Lives in an apartment.  Single.  Lives alone but has a girlfriend that helps care for him.  Does not use any assist devices.        [name]:  [phone] Mom, emergency contact      [facility]:   Patient continuing to live in her apartment in town alone. 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    Family History:   The patient's family history includes Depression in his mother; Diabetes in his mother; Sickle cell trait in his brother, father, and mother.  ROS:  Please see the history of present illness.  All other ROS reviewed and negative.     Physical Exam/Data:   Vitals:   07/26/16 0945 07/26/16 0953 07/26/16 1043 07/26/16 1044  BP: 92/68   119/63  Pulse: 78   84  Resp: 19   18  Temp:  98.9 F (37.2 C)  98.9 F (37.2 C)  TempSrc:  Oral  Oral  SpO2: 97%   97%  Weight:   184 lb 4.9 oz (83.6 kg)   Height:        Intake/Output Summary (Last 24 hours) at 07/26/16 1254 Last data filed at 07/26/16 1159  Gross per 24 hour  Intake             1200 ml  Output                0 ml  Net             1200 ml   Filed Weights   07/26/16 0133 07/26/16 1043  Weight: 195 lb (88.5 kg) 184 lb 4.9 oz (83.6 kg)   Body mass index is 25 kg/m.  General: Well developed, well nourished, in no acute distress. Head: Normocephalic, atraumatic, sclera non-icteric, no xanthomas, nares are without discharge.  Neck: Negative for carotid bruits. JVD not elevated. Lungs: Clear bilaterally to auscultation without wheezes, rales, or rhonchi. Breathing is unlabored  on 2L [place] Heart: RRR with S1 S2. No murmurs, rubs, or gallops appreciated.  Abdomen: Soft,  non-tender, non-distended with normoactive bowel sounds. No hepatomegaly. No rebound/guarding. No obvious abdominal masses. Msk:  Strength and tone appear normal for age. Extremities: No clubbing or cyanosis. No edema.  Distal pedal pulses are 2+ and equal bilaterally. Neuro: Alert and oriented X 3. No facial asymmetry. No focal deficit. Moves all extremities spontaneously. Psych:  Responds to questions appropriately with a normal affect.  EKG:  The EKG was personally reviewed and demonstrates HR 94. Nonspecific intraventricular conduction delay. LBB  Relevant CV Studies:  ECHO 2D (04/26/2016) Study Conclusions  - Left ventricle: Septal flattening from significant pulmonary   hypertension The cavity size was normal. Wall thickness was   normal. Systolic function was normal. The estimated ejection   fraction was in the range of 55% to 60%. - Right ventricle: The cavity size was moderately dilated. - Right atrium: The atrium was moderately dilated. - Atrial septum: No defect or patent foramen ovale was identified. - Tricuspid valve: There was moderate-severe regurgitation. - Pulmonary arteries: PA peak pressure: 78 mm Hg (S). - Impressions: Significant pulmonary hypertension likely from   sickle cell disease Suggest pulmonary consult   PA pressure similar to study done 08/2014.  Impressions:  - Significant pulmonary hypertension likely from sickle cell   disease Suggest pulmonary consult   PA pressure similar to study done 08/2014.   Laboratory Data:  Chemistry Recent Labs Lab 07/20/16 1609 07/23/16 2216 07/26/16 0119  NA 139 139 136  K 3.2* 3.4* 3.3*  CL 108 110 109  CO2 21* 20* 20*  GLUCOSE 107* 101* 130*  BUN 6 7 9   CREATININE 0.68 0.76 1.23  CALCIUM 9.0 9.0 8.9  GFRNONAA >60 >60 >60  GFRAA >60 >60 >60  ANIONGAP 10 9 7      Recent Labs Lab 07/20/16 1609 07/23/16 2216  07/26/16 0119  PROT 7.6 7.8 7.7  ALBUMIN 4.1 4.4 4.1  AST 45* 31 34  ALT 39 27 22  ALKPHOS 99 97 88  BILITOT 5.6* 6.9* 7.8*   Hematology Recent Labs Lab 07/20/16 1609  07/23/16 2216 07/26/16 0119  WBC 15.0*  --  19.1* 28.8*  RBC 2.81*  2.81*  < > 3.06*  3.06* 3.17*  3.17*  HGB 8.5*  --  9.2* 9.5*  HCT 25.1*  --  27.5* 28.8*  MCV 89.3  --  89.9 90.9  MCH 30.2  --  30.1 30.0  MCHC 33.9  --  33.5 33.0  RDW 18.5*  --  19.1* 18.5*  PLT 525*  --  650* 542*  < > = values in this interval not displayed. Cardiac Enzymes Recent Labs Lab 07/26/16 0457  TROPONINI 0.25*    Recent Labs Lab 07/20/16 1616 07/26/16 0129 07/26/16 0456  TROPIPOC 0.00 0.03 0.27*    BNP Recent Labs Lab 07/26/16 0457  BNP 494.3*    DDimer No results for input(s): DDIMER in the last 168 hours.  Radiology/Studies:  Ct Angio Chest Pe W And/or Wo Contrast  Result Date: 07/26/2016 CLINICAL DATA:  36 y/o M; rib pain, abdominal pain, jaundice, history of sickle cell, and history of asplenia. EXAM: CT ANGIOGRAPHY CHEST WITH CONTRAST TECHNIQUE: Multidetector CT imaging of the chest was performed using the standard protocol during bolus administration of intravenous contrast. Multiplanar CT image reconstructions and MIPs were obtained to evaluate the vascular anatomy. CONTRAST:  100 cc Isovue 370 COMPARISON:  Concurrent CT angiogram of the abdomen and pelvis. 07/18/2016 CT angiogram of the chest. FINDINGS: Cardiovascular: Enlarged main pulmonary artery measuring 4.2 cm. Normal  caliber thoracic aorta. Stable severe cardiomegaly. Left port catheter tip projects within the right atrium. No pericardial effusion. Multiple segmental acute pulmonary emboli within the lungs bilaterally best appreciated in the right lower lobe (series 7, image 151). Mediastinum/Nodes: No enlarged mediastinal, hilar, or axillary lymph nodes. Thyroid gland, trachea, and esophagus demonstrate no significant findings. Lungs/Pleura: Stable 6  mm nodule in the right middle lobe. Septal thickening of the lung bases and right perihilar ground-glass opacities probably represents mild pulmonary edema. No pleural effusion. Upper Abdomen: Calcified atrophic spleen. Musculoskeletal: H shaped vertebral body compatible with bony stigmata of sickle cell disease. Review of the MIP images confirms the above findings. IMPRESSION: 1. Multiple segmental acute pulmonary emboli. 2. Stable severe cardiomegaly. 3. Enlarged main pulmonary artery indicates pulmonary artery hypertension. 4. Mild pulmonary edema. 5. Calcified atrophic spleen. These results were called by telephone at the time of interpretation on 07/26/2016 at 3:34 am to Dr. Abigail Butts , who verbally acknowledged these results. Electronically Signed   By: Kristine Garbe M.D.   On: 07/26/2016 03:36   Dg Chest Port 1 View  Result Date: 07/26/2016 CLINICAL DATA:  Awakened with chest pain. History of sickle cell disease. EXAM: PORTABLE CHEST 1 VIEW COMPARISON:  07/20/2016 FINDINGS: Unchanged prominent cardiac silhouette. The lungs are clear. No pleural effusions. No pneumothorax. Hilar and mediastinal contours are unremarkable and unchanged. Left-sided port appears intact, extending into the low SVC. IMPRESSION: Enlarged cardiac silhouette without significant interval change. No consolidation or effusion. Electronically Signed   By: Andreas Newport M.D.   On: 07/26/2016 01:52   Ct Angio Abdomen Pelvis  W &/or Wo Contrast  Result Date: 07/26/2016 CLINICAL DATA:  Chest wall pain and abdominal pain. Sickle cell disease. EXAM: CTA ABDOMEN AND PELVIS wITHOUT AND WITH CONTRAST TECHNIQUE: Multidetector CT imaging of the abdomen and pelvis was performed using the standard protocol during bolus administration of intravenous contrast. Multiplanar reconstructed images and MIPs were obtained and reviewed to evaluate the vascular anatomy. CONTRAST:  150 mL Isovue 370 intravenous COMPARISON:   12/15/2015 FINDINGS: VASCULAR Aorta: Normal caliber with mild atherosclerotic calcification. No dissection or aneurysm. Celiac: Patent without evidence of aneurysm, dissection, vasculitis or significant stenosis. Small calcified spleen. Splenic artery is diminutive, possibly occluded. SMA: Patent without evidence of aneurysm, dissection, vasculitis or significant stenosis. Renals: Both renal arteries are patent without evidence of aneurysm, dissection, vasculitis, fibromuscular dysplasia or significant stenosis. IMA: Patent without evidence of aneurysm, dissection, vasculitis or significant stenosis. Inflow: Normal caliber and intact. Mild atherosclerotic calcification. Proximal Outflow: Bilateral common femoral and visualized portions of the superficial and profunda femoral arteries are patent without evidence of aneurysm, dissection, vasculitis or significant stenosis. Veins: No obvious venous abnormality within the limitations of this arterial phase study. Review of the MIP images confirms the above findings. NON-VASCULAR Hepatobiliary: Cholecystectomy.  No focal liver lesion. Pancreas: Unremarkable. No pancreatic ductal dilatation or surrounding inflammatory changes. Spleen: Small calcified spleen, typical of sickle cell disease. Adrenals/Urinary Tract: Both adrenals are normal. Scattered renal parenchymal scars. Collecting systems and ureters are unremarkable. Urinary bladder is unremarkable. Stomach/Bowel: Stomach is within normal limits. Appendix appears normal. No evidence of bowel wall thickening, distention, or inflammatory changes. Lymphatic: No adenopathy in the abdomen or pelvis. Reproductive: Unremarkable Other: Umbilical hernia containing unobstructed small bowel. 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 No significant skeletal lesion. IMPRESSION: VASCULAR 1. Aortic atherosclerosis. 2. No aneurysm or dissection. Major branches of the aorta are also patent and normal in  caliber. NON-VASCULAR 1.  Small calcified spleen, typical of sickle cell disease. 2. Unobstructed small bowel within an umbilical hernia. 3. No acute findings are evident in the abdomen or pelvis. Electronically Signed   By: Andreas Newport M.D.   On: 07/26/2016 04:00    Assessment and Plan:   1. Syncope with intraventricular conduction delay on EKG:  The patient has had a syncopal episode in the context of non compliance with his Xarelto and home 02 because of a friends wedding this week, [name] subsequently developed acute multiple segmental pulmonary embolism.  -- It is difficult to assess QTc with a bundle branch block. The patients QTc is not > 500 and very unlikely the cause of his syncope which is more likely due to acute PE, hypotension and hypoxia. -- [name] is negative for DVT.   2. Elevated Troponin:  [name] is on heparin drip for PE, recommend transitioning back to Xarelto.  [name] had a bump in his Troponin at 0.25, this is most likely due to demand ischemia given the clinical picture. Transthoracic echocardiogram is pending. No ischemic work up is planned.   Kristopher Glee, PA-C  07/26/2016 12:54 PM   Attending Note:   The patient was seen and examined.  Agree with assessment and plan as noted above.  Changes made to the above note as needed.   Patient seen and independently examined with Delos Haring, PA .   We discussed all aspects of the encounter. I agree with the assessment and plan as stated above.  1. Syncope:   Mitchell had an episode of hypotension and hypoxemia related to a pulmonary embolus. No arrhythmias on tele. Given the diagnosis of acute PE by CT angio. I do not think [name] needs any further evaluation for this episode [name] will need to go home on the PE dose of Xarelto - 15 mg po BID for 3 weeks and then back to 20 mg daily .   His creatinine is up slighlty.  This will need to be watched    2. ? Prolonged Qt. [name] has a RBBB.   The QT interval is normal  No  additional work up needed    I have spent a total of 40 minutes with patient reviewing hospital  notes , telemetry, EKGs, labs and examining patient as well as establishing an assessment and plan that was discussed with the patient. > 50% of time was spent in direct patient care.  Will sign off Call for questions   Ramond Dial., MD, [facility] 07/26/2016, 1:46 PM [address]  [place] Pager [phone]

## 2016-07-26 NOTE — ED Notes (Signed)
Dr. Marily Memos advised this RN patient will now be getting admitted to [facility] instead of [facility]

## 2016-07-26 NOTE — ED Notes (Signed)
Niu MD aware of troponin, no new orders at this time

## 2016-07-26 NOTE — Progress Notes (Signed)
  Echocardiogram 2D Echocardiogram has been performed.  Darlina Sicilian M 07/26/2016, 1:53 PM

## 2016-07-26 NOTE — Progress Notes (Signed)
Brief Pharmacy Note-IV heparin  In brief, this is a 36 y/oM on IV heparin infusion for acute PE. See note by Hershal Coria, PharmD, for full details.  Assessment:  Heparin level = 0.96 units/mL-elevated, likely due to recent Xarelto use.   aPTT = 56 seconds, subtherapeutic on heparin infusion at 1400 units/hr  CBC: Hgb 9.5, Pltc elevated at 542K  No bleeding or infusion issues reported per nursing   Plan:   Given recent Xarelto use, heparin level is falsely elevated. Therefore, will adjust heparin infusion based on aPTT at this time until both heparin level and aPTT correlate and effects of Xarelto on heparin level have diminished.  Adjust heparin infusion to 1550 units/hr.  Check aPTT 6 hours after rate change.  Daily CBC, heparin level.   Monitor for s/sx of bleeding.  F/u transition to oral anticoagulation.    Lindell Spar, PharmD, BCPS Pager: [phone] 07/26/2016 4:29 PM

## 2016-07-26 NOTE — H&P (Signed)
Hospital Admission Note Date: 07/26/2016  Patient name: [name] Medical record number: [id number] Date of birth: April 27, 1980 Age: 36 y.o. 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 According to patient he was asleep and had a sudden onset of B/L rib pain which awoke him. He got up to go to the bathroom and took his Oxygen off. He reports that he had no warning of dizziness or lightheadedness before having the syncopal episode but did have 2 episodes of emesis prior to "passing out". He is unsure of the amount of time that he was unresponsive. EMS was called and transported him to the ED. He recalls that he had regained consciousness prior to the EMS arriving but was feeling weak. EMS reports a low BP (60/40) and desaturation at the scene however, with placement in the Trendelenburg position he had improvement in mental status and BP. On arrival to the ED his BO was 111/78 and saturations were 88% on RA. Evaluation in the ED showed multiple segmental acute PE. Pt admits to having missed 3 days of taking Xarelto. 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  He has taken his pain medications as prescribed for chronic pain.   In the ED he is afebrile and Oxygenation is at baseline. His BP is marginal and he has received several doses of Dilaudid and pain is still persistent. I am asked to admit patient for acute PE.   Scheduled Meds: . carvedilol  3.125 mg Oral BID WC  . cholecalciferol  2,000 Units Oral Daily  . folic acid  2 mg Oral Daily  . gabapentin  300 mg Oral TID  . hydroxyurea  1,000 mg  Oral QODAY  . hydroxyurea  500 mg Oral QODAY  . L-glutamine  15 g Oral BID  . lisinopril  2.5 mg Oral QHS  . morphine  30 mg Oral Q12H  . potassium chloride SA  40 mEq Oral Daily  . sodium chloride flush  3 mL Intravenous Q12H   Continuous Infusions: . dextrose 5 % and 0.45% NaCl 100 mL/hr at 07/26/16 0539  . heparin 1,400 Units/hr (07/26/16 0611)   PRN Meds:.zolpidem Allergies: Patient has no known allergies. Past Medical History:  Diagnosis Date  . Acute chest syndrome ([place]) 06/18/2013  . Acute embolism and thrombosis of right internal jugular vein ([place])   . Alcohol consumption of one to four drinks per day   . Avascular necrosis ([facility])    Right Hip  . Blood transfusion   . Chronic anticoagulation   . Demand ischemia ([place]) 01/02/2014  . Former smoker   . Functional asplenia   . Hb-SS disease with crisis ([place])   . History of Clostridium difficile infection   . History of pulmonary embolus (PE)   . Hypertension   . Hypokalemia   . Leukocytosis    Chronic  . Mood disorder ([place])   . Noncompliance with medication regimen   . Oxygen deficiency   . Pulmonary hypertension ([place])   . Second hand tobacco smoke exposure   . Sickle cell anemia ([facility])   . Sickle-cell  crisis with associated acute chest syndrome ([place]) 05/13/2013  . Stroke ([place])   . Thrombocytosis ([facility])    Chronic  . Uses marijuana    Past Surgical History:  Procedure Laterality Date  . CHOLECYSTECTOMY     01/2008  . Excision of left periauricular cyst     10/2009  . Excision of right ear lobe cyst with primary closur     11/2007  . Porta cath placement    . Porta cath removal    . PORTACATH PLACEMENT  01/05/2012   Procedure: INSERTION PORT-A-CATH;  Surgeon: Odis Hollingshead, MD;  Location: [facility];  Service: General;  Laterality: N/A;  ultrasound guiced port a cath insertion with fluoroscopy  . Right hip replacement     08/2006  . UMBILICAL HERNIA REPAIR     01/2008   Family History  Problem Relation Age of  Onset  . Sickle cell trait Mother   . Depression Mother   . Diabetes Mother   . Sickle cell trait Father   . Sickle cell trait Brother    Social History   Social History  . Marital status: Single    Spouse name: N/A  . Number of children: 0  . Years of education: 57   Occupational History  . 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 Smoking status: Former Smoker    Packs/day: 0.50    Years: 10.00    Types: Cigarettes    Quit date: 05/29/2011  . Smokeless tobacco: Never Used  . Alcohol use No  . Drug use: No     Comment: Marijuana weekly  . Sexual activity: Yes    Partners: Female     Comment: month ago   Other Topics Concern  . Not on file   Social History Narrative   Lives in an apartment.  Single.  Lives alone but has a girlfriend that helps care for him.  Does not use any assist devices.        [name]:  [phone] Mom, emergency contact      [facility]:   Patient continuing to live in her apartment in town alone. 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   Review of Systems: Pertinent items noted in HPI and remainder of comprehensive ROS otherwise negative. Physical Exam:  Intake/Output Summary (Last 24 hours) at 07/26/16 1110 Last data filed at 07/26/16 0241  Gross per 24 hour  Intake             1000 ml  Output                0 ml  Net             1000 ml   General: Alert, awake, oriented x3, in no acute distress.  HEENT: [place]/AT PEERL, EOMI Neck: Trachea midline,  no masses, no thyromegal,y no JVD, no carotid bruit OROPHARYNX:  Moist, No exudate/ erythema/lesions.  Heart: Regular rate and rhythm, without murmurs, rubs, gallops, PMI non-displaced, no heaves or thrills on palpation.  Lungs: Clear to auscultation, no wheezing or rhonchi noted. No increased vocal fremitus resonant to percussion  Abdomen: Soft, nontender, nondistended, positive bowel sounds, no masses no  hepatosplenomegaly noted..  Neuro: No focal neurological deficits noted cranial nerves II through XII grossly intact. DTRs 2+ bilaterally upper and lower extremities. Strength 5 out of 5 in bilateral upper and lower extremities. Musculoskeletal: No warm swelling  or erythema around joints, no spinal tenderness noted. Psychiatric: Patient alert and oriented x3, good insight and cognition, good recent to remote recall. Lymph node survey: No cervical axillary or inguinal lymphadenopathy noted.  Lab results:  Recent Labs  07/23/16 2216 07/26/16 0119  NA 139 136  K 3.4* 3.3*  CL 110 109  CO2 20* 20*  GLUCOSE 101* 130*  BUN 7 9  CREATININE 0.76 1.23  CALCIUM 9.0 8.9    Recent Labs  07/23/16 2216 07/26/16 0119  AST 31 34  ALT 27 22  ALKPHOS 97 88  BILITOT 6.9* 7.8*  PROT 7.8 7.7  ALBUMIN 4.4 4.1    Recent Labs  07/26/16 0119  LIPASE 40    Recent Labs  07/23/16 2216 07/26/16 0119  WBC 19.1* 28.8*  NEUTROABS 15.1* 24.2*  HGB 9.2* 9.5*  HCT 27.5* 28.8*  MCV 89.9 90.9  PLT 650* 542*    Recent Labs  07/26/16 0457  TROPONINI 0.25*   Invalid input(s): POCBNP No results for input(s): DDIMER in the last 72 hours. No results for input(s): HGBA1C in the last 72 hours. No results for input(s): CHOL, HDL, LDLCALC, TRIG, CHOLHDL, LDLDIRECT in the last 72 hours. No results for input(s): TSH, T4TOTAL, T3FREE, THYROIDAB in the last 72 hours.  Invalid input(s): [place]  07/23/16 2216 07/26/16 0119  RETICCTPCT 10.7* 11.0*   Imaging results:  Dg Chest 2 View  Result Date: 07/20/2016 CLINICAL DATA:  36 year old male with sickle cell disease. Bilateral rib and leg pain. EXAM: CHEST  2 VIEW COMPARISON:  Chest CTA 07/18/2016 and earlier. FINDINGS: Stable cardiomegaly and mediastinal contours. Stable left subclavian Port-A-Cath, accessed. Stable to mildly improved lung volumes. No pneumothorax. Stable pulmonary vascularity without acute edema. No pleural effusion  or confluent pulmonary opacity. Stable cholecystectomy clips. Negative visible bowel gas pattern. Stable visualized osseous structures, osteopenia and also H-type vertebral body changes better demonstrated on the recent CTA. IMPRESSION: Stable cardiomegaly. No acute cardiopulmonary abnormality. Electronically Signed   By: Genevie Ann M.D.   On: 07/20/2016 16:47   Dg Chest 2 View  Result Date: 07/18/2016 CLINICAL DATA:  Bilateral chest and back pain, somnolence. Former smoker. History of sickle cell anemia, pulmonary embolus. EXAM: CHEST  2 VIEW COMPARISON:  Portable chest x-ray of July 14, 2016 FINDINGS: The lungs are well-expanded. There is no focal infiltrate. No significant pleural effusion is visible. The cardiac silhouette remains enlarged. The pulmonary vascularity remains engorged. The pulmonary interstitial markings are mildly increased over baseline. The power port catheter tip projects over the cavoatrial junction. The mediastinum is normal in width. The bony thorax exhibits no acute abnormality. IMPRESSION: COPD and smoking related changes, stable. Mild pulmonary interstitial prominence bilaterally suggests low-grade CHF. Stable cardiomegaly. Electronically Signed   By: David  Martinique M.D.   On: 07/18/2016 13:05   Dg Chest 2 View  Result Date: 07/03/2016 CLINICAL DATA:  Sickle cell anemia.  Pain crisis. EXAM: CHEST  2 VIEW COMPARISON:  06/29/2016 FINDINGS: Left Port-A-Cath remains in place, unchanged. Cardiomegaly with vascular congestion. No confluent opacities or effusions. No acute bony abnormality. IMPRESSION: Cardiomegaly with vascular congestion. Electronically Signed   By: Rolm Baptise M.D.   On: 07/03/2016 18:51   Dg Chest 2 View  Result Date: 06/29/2016 CLINICAL DATA:  Sickle cell crisis with chest pain and shortness of breath. EXAM: CHEST  2 VIEW COMPARISON:  06/26/2016; 04/29/2016; chest CT - 05/06/2016 FINDINGS: Grossly unchanged enlarged cardiac silhouette and mediastinal contours.  Stable position of support  apparatus. Suspected slight worsening of linear heterogeneous potential airspace opacities within the right mid lung. Peripheral and basilar heterogeneous opacities are unchanged. No pleural effusion or pneumothorax. No evidence of edema. No acute osseus abnormalities. IMPRESSION: Worsening ill-defined right mid lung heterogeneous opacities could be indicative of early acute chest syndrome given provided history of sickle cell disease. Electronically Signed   By: Sandi Mariscal M.D.   On: 06/29/2016 12:37   Ct Angio Chest Pe W And/or Wo Contrast  Result Date: 07/26/2016 CLINICAL DATA:  36 y/o M; rib pain, abdominal pain, jaundice, history of sickle cell, and history of asplenia. EXAM: CT ANGIOGRAPHY CHEST WITH CONTRAST TECHNIQUE: Multidetector CT imaging of the chest was performed using the standard protocol during bolus administration of intravenous contrast. Multiplanar CT image reconstructions and MIPs were obtained to evaluate the vascular anatomy. CONTRAST:  100 cc Isovue 370 COMPARISON:  Concurrent CT angiogram of the abdomen and pelvis. 07/18/2016 CT angiogram of the chest. FINDINGS: Cardiovascular: Enlarged main pulmonary artery measuring 4.2 cm. Normal caliber thoracic aorta. Stable severe cardiomegaly. Left port catheter tip projects within the right atrium. No pericardial effusion. Multiple segmental acute pulmonary emboli within the lungs bilaterally best appreciated in the right lower lobe (series 7, image 151). Mediastinum/Nodes: No enlarged mediastinal, hilar, or axillary lymph nodes. Thyroid gland, trachea, and esophagus demonstrate no significant findings. Lungs/Pleura: Stable 6 mm nodule in the right middle lobe. Septal thickening of the lung bases and right perihilar ground-glass opacities probably represents mild pulmonary edema. No pleural effusion. Upper Abdomen: Calcified atrophic spleen. Musculoskeletal: H shaped vertebral body compatible with bony stigmata of  sickle cell disease. Review of the MIP images confirms the above findings. IMPRESSION: 1. Multiple segmental acute pulmonary emboli. 2. Stable severe cardiomegaly. 3. Enlarged main pulmonary artery indicates pulmonary artery hypertension. 4. Mild pulmonary edema. 5. Calcified atrophic spleen. These results were called by telephone at the time of interpretation on 07/26/2016 at 3:34 am to Dr. Abigail Butts , who verbally acknowledged these results. Electronically Signed   By: Kristine Garbe M.D.   On: 07/26/2016 03:36   Ct Angio Chest Pe W Or Wo Contrast  Addendum Date: 07/18/2016   ADDENDUM REPORT: 07/18/2016 19:21 ADDENDUM: Add to IMPRESSION: Suspect a degree of underlying pulmonary arterial hypertension given prominence in the main pulmonary outflow tract. Electronically Signed   By: Lowella Grip III M.D.   On: 07/18/2016 19:21   Result Date: 07/18/2016 CLINICAL DATA:  Chest pain.  History of sickle cell disease EXAM: CT ANGIOGRAPHY CHEST WITH CONTRAST TECHNIQUE: Multidetector CT imaging of the chest was performed using the standard protocol during bolus administration of intravenous contrast. Multiplanar CT image reconstructions and MIPs were obtained to evaluate the vascular anatomy. CONTRAST:  90 mL Isovue 370 nonionic COMPARISON:  Chest radiograph July 18, 2016; chest CT May 06, 2016 FINDINGS: Cardiovascular: There is a pulmonary embolus in a posterior segment right lower lobe pulmonary artery branch, similar to prior study. No more central pulmonary embolus evident. There is not felt to be of right heart strain. Heart is diffusely enlarged. There is a small pericardial effusion. There is no thoracic aortic aneurysm or dissection. Port-A-Cath tip is at the cavoatrial junction. Main pulmonary outflow tract measures 3.8 cm in diameter consistent with pulmonary arterial hypertension. Mediastinum/Nodes: Visualized thyroid appears normal. There are multiple mediastinal lymph nodes which  appear similar to the previous study. There is not felt to be frank adenopathy by size criteria. Lungs/Pleura: There is bibasilar atelectasis. There is interstitial pulmonary edema.  There is a stable nodular opacity in the right middle lobe seen on axial slice 51 series 11 measuring 3 mm. There is a stable nodular opacity in the lateral segment of the right middle lobe measuring 6 x 6 mm, ground-glass in appearance, best seen on axial slice 51 series 11. There is no appreciable pleural effusion or pleural thickening. Upper Abdomen: Spleen is small and calcified consistent with prior infarct from sickle cell disease. There is reflux of contrast into the inferior vena cava and hepatic veins. Visualized upper abdominal structures otherwise appear unremarkable. Musculoskeletal: There are endplate infarcts and multiple thoracic spine levels. Multiple areas of sclerosis noted consistent with sickle cell disease. No appreciable extramedullary hematopoiesis noted. Review of the MIP images confirms the above findings. IMPRESSION: Small pulmonary embolus in a right lower lobe pulmonary artery branch, also present on most recent study. Suspect small chronic pulmonary embolus focus. No new pulmonary embolus compared to most recent study. Findings felt to be indicative of a degree of congestive heart failure. Reflux of contrast into the hepatic veins and inferior vena cava may indicate increase in right heart pressure. Nodular opacities, largest measuring 6 x 6 mm, ground-glass in appearance. This nodular opacity noted in right middle lobe. Initial follow-up with CT at 6-12 months is recommended to confirm persistence. If persistent, repeat CT is recommended every 2 years until 5 years of stability has been established. This recommendation follows the consensus statement: Guidelines for Management of Incidental Pulmonary Nodules Detected on CT Images: From the Fleischner Society 2017; Radiology 2017; 284:228-243. Multiple small  lymph nodes, stable. Bony changes and splenic appearance consistent with known sickle cell disease. Critical Value/emergent results were called by telephone at the time of interpretation on 07/18/2016 at 4:18 pm to Dr. Jola Schmidt , who verbally acknowledged these results. Electronically Signed: By: Lowella Grip III M.D. On: 07/18/2016 16:19   Dg Chest Port 1 View  Result Date: 07/26/2016 CLINICAL DATA:  Awakened with chest pain. History of sickle cell disease. EXAM: PORTABLE CHEST 1 VIEW COMPARISON:  07/20/2016 FINDINGS: Unchanged prominent cardiac silhouette. The lungs are clear. No pleural effusions. No pneumothorax. Hilar and mediastinal contours are unremarkable and unchanged. Left-sided port appears intact, extending into the low SVC. IMPRESSION: Enlarged cardiac silhouette without significant interval change. No consolidation or effusion. Electronically Signed   By: Andreas Newport M.D.   On: 07/26/2016 01:52   Dg Chest Port 1 View  Result Date: 07/14/2016 CLINICAL DATA:  36 year old male with history of sickle cell disease presenting with bilateral chest pain, body pain and shortness of breath today. EXAM: PORTABLE CHEST 1 VIEW COMPARISON:  Chest x-ray 07/03/2016. FINDINGS: Mild blunting of the left costophrenic sulcus and obscuration of the lateral left hemidiaphragm suggesting a small left pleural effusion. No right pleural effusion. No definite consolidative airspace disease. Scattered areas of linear scarring in the lung bases, similar to prior studies. Cephalization of the pulmonary vasculature, without frank pulmonary edema. No pneumothorax. Heart size is mildly enlarged. Upper mediastinal contours are within normal limits. Left-sided subclavian single-lumen power porta cath with tip terminating at the superior cavoatrial junction. IMPRESSION: 1. New small left pleural effusion. 2. Chronic scarring in the lung bases bilaterally. 3. Cardiomegaly with pulmonary venous congestion, but no  frank pulmonary edema. Electronically Signed   By: Vinnie Langton M.D.   On: 07/14/2016 08:07   Ct Angio Abdomen Pelvis  W &/or Wo Contrast  Result Date: 07/26/2016 CLINICAL DATA:  Chest wall pain and abdominal pain. Sickle cell  disease. EXAM: CTA ABDOMEN AND PELVIS wITHOUT AND WITH CONTRAST TECHNIQUE: Multidetector CT imaging of the abdomen and pelvis was performed using the standard protocol during bolus administration of intravenous contrast. Multiplanar reconstructed images and MIPs were obtained and reviewed to evaluate the vascular anatomy. CONTRAST:  150 mL Isovue 370 intravenous COMPARISON:  12/15/2015 FINDINGS: VASCULAR Aorta: Normal caliber with mild atherosclerotic calcification. No dissection or aneurysm. Celiac: Patent without evidence of aneurysm, dissection, vasculitis or significant stenosis. Small calcified spleen. Splenic artery is diminutive, possibly occluded. SMA: Patent without evidence of aneurysm, dissection, vasculitis or significant stenosis. Renals: Both renal arteries are patent without evidence of aneurysm, dissection, vasculitis, fibromuscular dysplasia or significant stenosis. IMA: Patent without evidence of aneurysm, dissection, vasculitis or significant stenosis. Inflow: Normal caliber and intact. Mild atherosclerotic calcification. Proximal Outflow: Bilateral common femoral and visualized portions of the superficial and profunda femoral arteries are patent without evidence of aneurysm, dissection, vasculitis or significant stenosis. Veins: No obvious venous abnormality within the limitations of this arterial phase study. Review of the MIP images confirms the above findings. NON-VASCULAR Hepatobiliary: Cholecystectomy.  No focal liver lesion. Pancreas: Unremarkable. No pancreatic ductal dilatation or surrounding inflammatory changes. Spleen: Small calcified spleen, typical of sickle cell disease. Adrenals/Urinary Tract: Both adrenals are normal. Scattered renal parenchymal  scars. Collecting systems and ureters are unremarkable. Urinary bladder is unremarkable. Stomach/Bowel: Stomach is within normal limits. Appendix appears normal. No evidence of bowel wall thickening, distention, or inflammatory changes. Lymphatic: No adenopathy in the abdomen or pelvis. Reproductive: Unremarkable Other: Umbilical hernia containing unobstructed small bowel. 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 No significant skeletal lesion. IMPRESSION: VASCULAR 1. Aortic atherosclerosis. 2. No aneurysm or dissection. Major branches of the aorta are also patent and normal in caliber. NON-VASCULAR 1. Small calcified spleen, typical of sickle cell disease. 2. Unobstructed small bowel within an umbilical hernia. 3. No acute findings are evident in the abdomen or pelvis. Electronically Signed   By: Andreas Newport M.D.   On: 07/26/2016 04:00   Other results: EKG: RBBB with increase .   Assessment and Plan: 1. Syncope; Likely secondary to PE. However with a H/O prolonged QTc will ask Cardiology to see. Also cycle enzymes. This may also have been a result of hypoxia.  2. Hypotension: pt was reported to be hypotensive at site per 1st responders. However he has had no hypotension since arriving to the ED.  3. Acute PE: Continue Heparin gtt. Will transition back to Xarelto as he did not fail therapy but rather failed to adhere to therapy.  4. Hb SS without Crisis: Continue MS Contin and Dilaudid PRN.  5. Chronic Pain Syndrome: Continue MS Contin.  6. PAH and Cor Pulmonale leading to Chronic Hypoxic Respiratory Syndrome with Hypoxia: Saturations at baseline. Continue Oxygen at 3 L/min.  7. Recurrent PE; 8. Non-adherence to Medical Therapy: Pt was without Oxygen for 3 hours on Friday and 5 hours on Saturday. As well he did not take Xarelto on either Friday or Saturday,  9. Prolonged QTc: Consult cardiology in light of syncope 10. H/O Hypokalemia: On Chronic Potassium.  Continue  [name],[name] 07/26/2016, 11:10 AM    [name],[name]  Pager [phone]. If 7PM-7AM, please contact night-coverage.

## 2016-07-26 NOTE — ED Provider Notes (Signed)
[name] DEPT Provider Note   CSN: [id number] Arrival date & time: 07/26/16  0102     History   Chief Complaint Chief Complaint  Patient presents with  . Hypotension    HPI [name] is a 36 y.o. male with a Medical hx of sickle cell anemia, chronic oxygen usage at 4 L/m via nasal cannula, acute chest syndrome chronic alcohol usage, chronic anticoagulation on Xarelto, hypertension, prolonged QT, ischemic stroke, asplenia, PE, cholecystectomy, marijuana use presents to the Emergency Department complaining of sudden onset, progressive bilateral rib pain and epigastric abdominal pain onset around 12:30 AM. Patient reports this pain woke him from sleep. He vomited bilious emesis 2 and then fell to the floor. Per EMS patient was ashen upon their arrival with a blood pressure of 60/40. There were able to obtain vascular access. Patient placed in Trendelenburg with improvement in mental status and blood pressure. Patient reports he had a short episode of similar symptoms lasting approximately 10 minutes yesterday which resolved spontaneously. Record review shows the patient was evaluated on 07/23/2016 for sickle cell crisis and discharged home.  Patient reports that he feels "foggy" and nauseated. No neck avoiding alleviating factors. Patient reports large-volume alcohol intake this weekend due to wedding but denies daily alcohol usage that this is documented in the chart. Patient denies any drug use including IV drug use.   The history is provided by the patient and medical records. No language interpreter was used.    Past Medical History:  Diagnosis Date  . Acute chest syndrome ([place]) 06/18/2013  . Acute embolism and thrombosis of right internal jugular vein ([place])   . Alcohol consumption of one to four drinks per day   . Avascular necrosis (HCC)    Right Hip  . Blood transfusion   . Chronic anticoagulation   . Demand ischemia ([place]) 01/02/2014  . Former smoker   . Functional  asplenia   . Hb-SS disease with crisis ([place])   . History of Clostridium difficile infection   . History of pulmonary embolus (PE)   . Hypertension   . Hypokalemia   . Leukocytosis    Chronic  . Mood disorder ([place])   . Noncompliance with medication regimen   . Oxygen deficiency   . Pulmonary hypertension ([place])   . Second hand tobacco smoke exposure   . Sickle cell anemia (HCC)   . Sickle-cell crisis with associated acute chest syndrome ([place]) 05/13/2013  . Stroke ([place])   . Thrombocytosis (HCC)    Chronic  . Uses marijuana     Patient Active Problem List   Diagnosis Date Noted  . PE (pulmonary thromboembolism) ([place]) 07/26/2016  . Acute pain 07/21/2016  . Sickle cell crisis ([place]) 02/23/2016  . H/O arterial ischemic stroke 11/11/2015  . Slurred speech 11/10/2015  . Chronic anemia   . Hb-SS disease without crisis ([place]) 10/07/2014  . Prolonged Q-T interval on ECG 09/10/2014  . Cor pulmonale, chronic ([place]) 08/25/2014  . Sickle cell anemia ([place]) 06/25/2014  . Anemia of chronic disease 06/25/2014  . PAH (pulmonary artery hypertension) ([place]) 03/18/2014  . Chronic respiratory failure with hypoxia ([place]) 03/14/2014  . Paralytic strabismus, external ophthalmoplegia   . Chronic pain syndrome 12/12/2013  . Chronic anticoagulation 08/22/2013  . Essential hypertension 08/22/2013  . Pulmonary HTN ([place]) 06/18/2013  . Functional asplenia   . Vitamin D deficiency 02/13/2013  . Hx of pulmonary embolus 06/29/2012  . 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 Avascular necrosis (HCC)     Past  Surgical History:  Procedure Laterality Date  . CHOLECYSTECTOMY     01/2008  . Excision of left periauricular cyst     10/2009  . Excision of right ear lobe cyst with primary closur     11/2007  . Porta cath placement    . Porta cath removal    . PORTACATH PLACEMENT  01/05/2012   Procedure: INSERTION PORT-A-CATH;  Surgeon: Odis Hollingshead, MD;  Location: [facility];  Service: General;  Laterality: N/A;   ultrasound guiced port a cath insertion with fluoroscopy  . Right hip replacement     08/2006  . UMBILICAL HERNIA REPAIR     01/2008       Home Medications    Prior to Admission medications   Medication Sig Start Date End Date Taking? Authorizing Provider  carvedilol (COREG) 3.125 MG tablet Take 1 tablet (3.125 mg total) by mouth 2 (two) times daily with a meal. 05/17/16   [name], MD  cholecalciferol (VITAMIN D) 1000 units tablet Take 2,000 Units by mouth daily.    [provider]  folic acid (FOLVITE) 1 MG tablet Take 2 tablets (2 mg total) by mouth daily. 04/25/16   [name], MD  gabapentin (NEURONTIN) 300 MG capsule Take 1 capsule (300 mg total) by mouth 3 (three) times daily. 06/01/15   Tresa Garter, MD  hydroxyurea (HYDREA) 500 MG capsule Take 500 mg by mouth every other day. May take with food to minimize GI side effects. 06/25/16   [provider]  hydroxyurea (HYDREA) 500 MG capsule Take 1,000 mg by mouth every other day. May take with food to minimize GI side effects. 06/24/16   [provider]  L-glutamine (ENDARI) 5 g PACK Powder Packet Take 15 g by mouth 2 (two) times daily. 04/25/16   [name], MD  lisinopril (PRINIVIL,ZESTRIL) 2.5 MG tablet Take 2.5 mg by mouth at bedtime.  05/22/16   [provider]  morphine (MS CONTIN) 30 MG 12 hr tablet Take 1 tablet (30 mg total) by mouth every 12 (twelve) hours. 07/09/16 08/08/16  Scot Jun, FNP  potassium chloride SA (K-DUR,KLOR-CON) 20 MEQ tablet Take 2 tablets (40 mEq total) by mouth daily. 04/27/16   [name], MD  rivaroxaban (XARELTO) 20 MG TABS tablet Take 1 tablet (20 mg total) by mouth at bedtime. 06/23/16   Dorena Dew, FNP  torsemide (DEMADEX) 20 MG tablet Take 1 tablet (20 mg total) by mouth daily as needed (leg swelling). 06/13/16   [name], MD  zolpidem (AMBIEN) 10 MG tablet Take 1 tablet (10 mg total) by mouth at  bedtime. Patient taking differently: Take 10 mg by mouth at bedtime as needed for sleep.  07/05/16 08/04/16  Scot Jun, FNP    Family History Family History  Problem Relation Age of Onset  . Sickle cell trait Mother   . Depression Mother   . Diabetes Mother   . Sickle cell trait Father   . Sickle cell trait Brother     Social History Social History  Substance Use Topics  . Smoking status: Former Smoker    Packs/day: 0.50    Years: 10.00    Types: Cigarettes    Quit date: 05/29/2011  . Smokeless tobacco: Never Used  . Alcohol use No     Allergies   Patient has no known allergies.   Review of Systems Review of Systems  Constitutional: Positive for fatigue.  Eyes: Positive for visual  disturbance ( "foggy").  Respiratory: Negative for shortness of breath.   Cardiovascular: Positive for chest pain ( rib pain).  Gastrointestinal: Positive for abdominal pain ( epigastric), nausea and vomiting.  Neurological: Positive for syncope, weakness and light-headedness.  All other systems reviewed and are negative.    Physical Exam Updated Vital Signs BP 111/78 (BP Location: Left Arm)   Pulse 97   Temp 98.8 F (37.1 C) (Oral)   Resp (!) 33   SpO2 (!) 88%   Physical Exam  Constitutional: He appears well-developed. No distress.  Awake, alert, ill-appearing  HENT:  Head: Normocephalic and atraumatic.  Mouth/Throat: Oropharynx is clear and moist. Mucous membranes are pale and dry. No oropharyngeal exudate.  Bilious emesis around the mouth  Eyes: Conjunctivae are normal. Scleral icterus is present.  Neck: Normal range of motion. Neck supple.  Cardiovascular: Regular rhythm and intact distal pulses.  Tachycardia present.   Pulses:      Radial pulses are 1+ on the right side, and 1+ on the left side.       Dorsalis pedis pulses are 1+ on the right side, and 1+ on the left side.  Distal pulses are weak  Pulmonary/Chest: Effort normal and breath sounds normal. No  respiratory distress. He has no wheezes.  Equal chest expansion  Abdominal: Soft. Bowel sounds are normal. He exhibits no distension and no mass. There is tenderness in the right upper quadrant, epigastric area and left upper quadrant. There is guarding. There is no rigidity and no rebound.  Musculoskeletal: Normal range of motion. He exhibits no edema.  Neurological: He is alert. GCS eye subscore is 3. GCS verbal subscore is 5. GCS motor subscore is 6.  Patient answers questions concisely  Moves extremities without ataxia  Skin: Skin is warm. He is diaphoretic. There is pallor.  Psychiatric: He has a normal mood and affect.  Nursing note and vitals reviewed.    ED Treatments / Results  Labs (all labs ordered are listed, but only abnormal results are displayed) Labs Reviewed  COMPREHENSIVE METABOLIC PANEL - Abnormal; Notable for the following:       Result Value   Potassium 3.3 (*)    CO2 20 (*)    Glucose, Bld 130 (*)    Total Bilirubin 7.8 (*)    All other components within normal limits  CBC WITH DIFFERENTIAL/PLATELET - Abnormal; Notable for the following:    WBC 28.8 (*)    RBC 3.17 (*)    Hemoglobin 9.5 (*)    HCT 28.8 (*)    RDW 18.5 (*)    Platelets 542 (*)    Neutro Abs 24.2 (*)    Monocytes Absolute 2.3 (*)    All other components within normal limits  PROTIME-INR - Abnormal; Notable for the following:    Prothrombin Time 34.2 (*)    All other components within normal limits  APTT - Abnormal; Notable for the following:    aPTT 55 (*)    All other components within normal limits  RETICULOCYTES - Abnormal; Notable for the following:    Retic Ct Pct 11.0 (*)    RBC. 3.17 (*)    Retic Count, Absolute 348.7 (*)    All other components within normal limits  CULTURE, BLOOD (ROUTINE X 2)  CULTURE, BLOOD (ROUTINE X 2)  LIPASE, BLOOD  URINALYSIS, ROUTINE W REFLEX MICROSCOPIC  BRAIN NATRIURETIC PEPTIDE  TROPONIN I  TROPONIN I  TROPONIN I  I-STAT CG4 LACTIC ACID, ED    I-STAT  TROPOININ, ED  I-STAT CG4 LACTIC ACID, ED  I-STAT TROPOININ, ED  TYPE AND SCREEN    EKG  EKG Interpretation  Date/Time:  Tuesday July 26 2016 01:20:36 EDT Ventricular Rate:  94 PR Interval:    QRS Duration: 118 QT Interval:  376 QTC Calculation: 471 R Axis:   -110 Text Interpretation:  Sinus rhythm Left atrial enlargement Nonspecific IVCD with LAD Inferior infarct, old Abnormal lateral Q waves No significant change since last tracing Confirmed by Thayer Jew [phone]) on 07/26/2016 1:23:39 AM       Radiology Ct Angio Chest Pe W And/or Wo Contrast  Result Date: 07/26/2016 CLINICAL DATA:  36 y/o M; rib pain, abdominal pain, jaundice, history of sickle cell, and history of asplenia. EXAM: CT ANGIOGRAPHY CHEST WITH CONTRAST TECHNIQUE: Multidetector CT imaging of the chest was performed using the standard protocol during bolus administration of intravenous contrast. Multiplanar CT image reconstructions and MIPs were obtained to evaluate the vascular anatomy. CONTRAST:  100 cc Isovue 370 COMPARISON:  Concurrent CT angiogram of the abdomen and pelvis. 07/18/2016 CT angiogram of the chest. FINDINGS: Cardiovascular: Enlarged main pulmonary artery measuring 4.2 cm. Normal caliber thoracic aorta. Stable severe cardiomegaly. Left port catheter tip projects within the right atrium. No pericardial effusion. Multiple segmental acute pulmonary emboli within the lungs bilaterally best appreciated in the right lower lobe (series 7, image 151). Mediastinum/Nodes: No enlarged mediastinal, hilar, or axillary lymph nodes. Thyroid gland, trachea, and esophagus demonstrate no significant findings. Lungs/Pleura: Stable 6 mm nodule in the right middle lobe. Septal thickening of the lung bases and right perihilar ground-glass opacities probably represents mild pulmonary edema. No pleural effusion. Upper Abdomen: Calcified atrophic spleen. Musculoskeletal: H shaped vertebral body compatible with bony stigmata  of sickle cell disease. Review of the MIP images confirms the above findings. IMPRESSION: 1. Multiple segmental acute pulmonary emboli. 2. Stable severe cardiomegaly. 3. Enlarged main pulmonary artery indicates pulmonary artery hypertension. 4. Mild pulmonary edema. 5. Calcified atrophic spleen. These results were called by telephone at the time of interpretation on 07/26/2016 at 3:34 am to Dr. Abigail Butts , who verbally acknowledged these results. Electronically Signed   By: Kristine Garbe M.D.   On: 07/26/2016 03:36   Dg Chest Port 1 View  Result Date: 07/26/2016 CLINICAL DATA:  Awakened with chest pain. History of sickle cell disease. EXAM: PORTABLE CHEST 1 VIEW COMPARISON:  07/20/2016 FINDINGS: Unchanged prominent cardiac silhouette. The lungs are clear. No pleural effusions. No pneumothorax. Hilar and mediastinal contours are unremarkable and unchanged. Left-sided port appears intact, extending into the low SVC. IMPRESSION: Enlarged cardiac silhouette without significant interval change. No consolidation or effusion. Electronically Signed   By: Andreas Newport M.D.   On: 07/26/2016 01:52   Ct Angio Abdomen Pelvis  W &/or Wo Contrast  Result Date: 07/26/2016 CLINICAL DATA:  Chest wall pain and abdominal pain. Sickle cell disease. EXAM: CTA ABDOMEN AND PELVIS wITHOUT AND WITH CONTRAST TECHNIQUE: Multidetector CT imaging of the abdomen and pelvis was performed using the standard protocol during bolus administration of intravenous contrast. Multiplanar reconstructed images and MIPs were obtained and reviewed to evaluate the vascular anatomy. CONTRAST:  150 mL Isovue 370 intravenous COMPARISON:  12/15/2015 FINDINGS: VASCULAR Aorta: Normal caliber with mild atherosclerotic calcification. No dissection or aneurysm. Celiac: Patent without evidence of aneurysm, dissection, vasculitis or significant stenosis. Small calcified spleen. Splenic artery is diminutive, possibly occluded. SMA: Patent  without evidence of aneurysm, dissection, vasculitis or significant stenosis. Renals: Both renal arteries are patent without  evidence of aneurysm, dissection, vasculitis, fibromuscular dysplasia or significant stenosis. IMA: Patent without evidence of aneurysm, dissection, vasculitis or significant stenosis. Inflow: Normal caliber and intact. Mild atherosclerotic calcification. Proximal Outflow: Bilateral common femoral and visualized portions of the superficial and profunda femoral arteries are patent without evidence of aneurysm, dissection, vasculitis or significant stenosis. Veins: No obvious venous abnormality within the limitations of this arterial phase study. Review of the MIP images confirms the above findings. NON-VASCULAR Hepatobiliary: Cholecystectomy.  No focal liver lesion. Pancreas: Unremarkable. No pancreatic ductal dilatation or surrounding inflammatory changes. Spleen: Small calcified spleen, typical of sickle cell disease. Adrenals/Urinary Tract: Both adrenals are normal. Scattered renal parenchymal scars. Collecting systems and ureters are unremarkable. Urinary bladder is unremarkable. Stomach/Bowel: Stomach is within normal limits. Appendix appears normal. No evidence of bowel wall thickening, distention, or inflammatory changes. Lymphatic: No adenopathy in the abdomen or pelvis. Reproductive: Unremarkable Other: Umbilical hernia containing unobstructed small bowel. 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 No significant skeletal lesion. IMPRESSION: VASCULAR 1. Aortic atherosclerosis. 2. No aneurysm or dissection. Major branches of the aorta are also patent and normal in caliber. NON-VASCULAR 1. Small calcified spleen, typical of sickle cell disease. 2. Unobstructed small bowel within an umbilical hernia. 3. No acute findings are evident in the abdomen or pelvis. Electronically Signed   By: Andreas Newport M.D.   On: 07/26/2016 04:00     Procedures Procedures (including critical care time)  CRITICAL CARE Performed by: Abigail Butts Total critical care time: 60 minutes Critical care time was exclusive of separately billable procedures and treating other patients. Critical care was necessary to treat or prevent imminent or life-threatening deterioration. Critical care was time spent personally by me on the following activities: development of treatment plan with patient and/or surrogate as well as nursing, discussions with consultants, evaluation of patient's response to treatment, examination of patient, obtaining history from patient or surrogate, ordering and performing treatments and interventions, ordering and review of laboratory studies, ordering and review of radiographic studies, pulse oximetry and re-evaluation of patient's condition.   Medications Ordered in ED Medications  0.45 % sodium chloride infusion (not administered)  fentaNYL (SUBLIMAZE) injection 50 mcg (not administered)  dextrose 5 %-0.45 % sodium chloride infusion (not administered)  HYDROmorphone (DILAUDID) injection 1 mg (not administered)  sodium chloride 0.9 % bolus 1,000 mL (0 mLs Intravenous Stopped 07/26/16 0240)  fentaNYL (SUBLIMAZE) injection 50 mcg (50 mcg Intravenous Given 07/26/16 0240)  iopamidol (ISOVUE-370) 76 % injection (100 mLs  Contrast Given 07/26/16 0302)     Initial Impression / Assessment and Plan / ED Course  I have reviewed the triage vital signs and the nursing notes.  Pertinent labs & imaging results that were available during my care of the patient were reviewed by me and considered in my medical decision making (see chart for details).  Clinical Course as of Jul 27 431  Tue Jul 26, 2016  0352 Discussed with radiology. Patient with several new segmental pulmonary emboli, not present on study several days ago. Study was unable to determine if patient has right heart strain.  No central emboli.  [HM]  [phone]  Discussed with Dr. Blaine Hamper who will admit  [HM]    Clinical Course User Index [HM] Bharat Antillon, Jarrett [place], [place]    Patient presents from home with bilateral chest pain and epigastric abdominal pain with hypotension and near syncope.  He has had additional bilious emesis.  Patient with long history of emboli due to his sickle cell disease.  Concern for possible PE versus mesenteric ischemia vs infarction of his liver.  Patient's blood pressure has remained greater than 037 systolic. He is tachypneic.  His initial oxygen saturation was 88% however he was only on 2 L/m via nasal cannula and is normally at 4 at home. This improved after his oxygen was increased to his baseline rate.  Previous blood pressures range from 048 systolic into the low 889V. No additional episodes of hypotension while patient has been supine here in the emergency department.  Patient also with new scleral icterus. His bili is elevated from baseline however his AST and ALT are within normal limits. His lactic acid is normal.  His hemoglobin remained stable at 9.5.  4:33 AM Tachycardia has improved significantly. No additional episodes of hypotension. No additional episodes of hypoxia. Repeat lactic and troponin are pending. Patient will be admitted to stepdown.  The patient was discussed with and seen by Dr. Dina Rich who agrees with the treatment plan.   Final Clinical Impressions(s) / ED Diagnoses   Final diagnoses:  PE (pulmonary thromboembolism) ([place])  Hb-SS disease with crisis ([place])  Hypotension, unspecified hypotension type    New Prescriptions New Prescriptions   No medications on file      Agapito Games 07/26/16 [id number]    Merryl Hacker, MD 07/26/16 [phone]

## 2016-07-26 NOTE — Progress Notes (Signed)
   Pt seen and examined. Appears to be stable from a hemodynamic standpoint at this time. Patient is well known to the sickle cell team at [facility]). Currently on baseline 4L [place] O2. Heparin drip and IVF initiated by EDP. Pt stable for transfer to [facility]. Discussed case w/ Dr. Zigmund Daniel of the sickle cell team who agrees w/ transfer to her service for further care of his acute pulmonary embolism and other chronic medical conditions. Greatly appreciate her assistance in the care of this patient.    Linna Darner, MD [facility] 07/26/2016, 8:17 AM

## 2016-07-26 NOTE — Progress Notes (Signed)
*  PRELIMINARY RESULTS* Vascular Ultrasound Bilateral lower extremity venous duplex has been completed.  Preliminary findings: No evidence of deep vein thrombosis in the visualized veins or baker's cysts bilaterally.   [name] 07/26/2016, 1:23 PM

## 2016-07-26 NOTE — Progress Notes (Signed)
ANTICOAGULATION CONSULT NOTE - Initial Consult  Pharmacy Consult for Heparin Indication: pulmonary embolus  No Known Allergies  Patient Measurements: Height: 6' (182.9 cm) Weight: 195 lb (88.5 kg) IBW/kg (Calculated) : 77.6  Vital Signs: Temp: 100.4 F (38 C) (06/26 0209) Temp Source: Rectal (06/26 0209) BP: 101/69 (06/26 0400) Pulse Rate: 88 (06/26 0400)  Labs:  Recent Labs  07/23/16 2216 07/26/16 0119  HGB 9.2* 9.5*  HCT 27.5* 28.8*  PLT 650* 542*  APTT  --  55*  LABPROT  --  34.2*  INR  --  3.29  CREATININE 0.76 1.23    Estimated Creatinine Clearance: 90.3 mL/min (by C-G formula based on SCr of 1.23 mg/dL).   Medical History: Past Medical History:  Diagnosis Date  . Acute chest syndrome ([place]) 06/18/2013  . Acute embolism and thrombosis of right internal jugular vein ([place])   . Alcohol consumption of one to four drinks per day   . Avascular necrosis (HCC)    Right Hip  . Blood transfusion   . Chronic anticoagulation   . Demand ischemia ([place]) 01/02/2014  . Former smoker   . Functional asplenia   . Hb-SS disease with crisis ([place])   . History of Clostridium difficile infection   . History of pulmonary embolus (PE)   . Hypertension   . Hypokalemia   . Leukocytosis    Chronic  . Mood disorder ([place])   . Noncompliance with medication regimen   . Oxygen deficiency   . Pulmonary hypertension ([place])   . Second hand tobacco smoke exposure   . Sickle cell anemia (HCC)   . Sickle-cell crisis with associated acute chest syndrome ([place]) 05/13/2013  . Stroke ([place])   . Thrombocytosis (HCC)    Chronic  . Uses marijuana     Medications:  No current facility-administered medications on file prior to encounter.    Current Outpatient Prescriptions on File Prior to Encounter  Medication Sig Dispense Refill  . carvedilol (COREG) 3.125 MG tablet Take 1 tablet (3.125 mg total) by mouth 2 (two) times daily with a meal.    . cholecalciferol (VITAMIN D) 1000 units tablet  Take 2,000 Units by mouth daily.    . folic acid (FOLVITE) 1 MG tablet Take 2 tablets (2 mg total) by mouth daily.    Marland Kitchen gabapentin (NEURONTIN) 300 MG capsule Take 1 capsule (300 mg total) by mouth 3 (three) times daily. 270 capsule 3  . hydroxyurea (HYDREA) 500 MG capsule Take 500 mg by mouth every other day. May take with food to minimize GI side effects.    . hydroxyurea (HYDREA) 500 MG capsule Take 1,000 mg by mouth every other day. May take with food to minimize GI side effects.    . L-glutamine (ENDARI) 5 g PACK Powder Packet Take 15 g by mouth 2 (two) times daily. 180 packet 1  . lisinopril (PRINIVIL,ZESTRIL) 2.5 MG tablet Take 2.5 mg by mouth at bedtime.     Marland Kitchen morphine (MS CONTIN) 30 MG 12 hr tablet Take 1 tablet (30 mg total) by mouth every 12 (twelve) hours. 60 tablet 0  . potassium chloride SA (K-DUR,KLOR-CON) 20 MEQ tablet Take 2 tablets (40 mEq total) by mouth daily. 30 tablet 2  . rivaroxaban (XARELTO) 20 MG TABS tablet Take 1 tablet (20 mg total) by mouth at bedtime. 30 tablet 3  . torsemide (DEMADEX) 20 MG tablet Take 1 tablet (20 mg total) by mouth daily as needed (leg swelling).    . zolpidem (AMBIEN) 10  MG tablet Take 1 tablet (10 mg total) by mouth at bedtime. (Patient taking differently: Take 10 mg by mouth at bedtime as needed for sleep. ) 30 tablet 0     Assessment: 36 y.o. male with PE for heparin.  Noted patient on Xarelto at home, with last dose taken 6/25 at 8 pm  Goal of Therapy:  APTT 66-102 while Xarelto affecting heparin level  Heparin level 0.3-0.7 units/ml Monitor platelets by anticoagulation protocol: Yes   Plan:  Due to recent Xarelto, will give heparin 2000 units IV bolus, then start heparin 1400 units/hr APTT in 6 hours  Dorothie Wah, Bronson Curb 07/26/2016,4:33 AM

## 2016-07-26 NOTE — Progress Notes (Signed)
  This is a no charge note  Pending admission per PA, Jarrett Soho  36 year old man with past medical history of sickle cell disease, acute chest syndrome, pulmonary embolism on Xarelt, C diff colitis, stroke, hypertension, who presents with syncope episode, nausea, vomiting, bilateral rib pain. Patient was initially hypotensive with blood pressure 60/40, oxygen desaturation.   Patient was found to have stable hemoglobin, WBC 28.8, INR 3.29, negative troponin, lactic acid 0.90, potassium 3.3, acute renal injury with creatinine 1.23, temperature 100.4. CT angiogram of abdomen/pelvis has no dissection. CT angiogram of chest that showed multiple segmental acute pulmonary emboli. Pt was given 1L NS bolus. Bp is 101/69. IV heparin was ordered. Since pt's major issue is PE, and feel that it is not safe to transfer pt to [facility], will admit to SDU in [name] hospital.    Ivor Costa, MD  [facility] Pager [phone]  If 7PM-7AM, please contact night-coverage [email or web address] Password [facility] 07/26/2016, 4:34 AM

## 2016-07-27 DIAGNOSIS — G894 Chronic pain syndrome: Secondary | ICD-10-CM | POA: Diagnosis not present

## 2016-07-27 DIAGNOSIS — I2699 Other pulmonary embolism without acute cor pulmonale: Secondary | ICD-10-CM | POA: Diagnosis not present

## 2016-07-27 DIAGNOSIS — J9611 Chronic respiratory failure with hypoxia: Secondary | ICD-10-CM | POA: Diagnosis not present

## 2016-07-27 DIAGNOSIS — R55 Syncope and collapse: Secondary | ICD-10-CM | POA: Diagnosis not present

## 2016-07-27 LAB — CBC
HEMATOCRIT: 25.8 % — AB (ref 39.0–52.0)
HEMOGLOBIN: 8.9 g/dL — AB (ref 13.0–17.0)
MCH: 30.4 pg (ref 26.0–34.0)
MCHC: 34.5 g/dL (ref 30.0–36.0)
MCV: 88.1 fL (ref 78.0–100.0)
Platelets: 433 10*3/uL — ABNORMAL HIGH (ref 150–400)
RBC: 2.93 MIL/uL — AB (ref 4.22–5.81)
RDW: 18 % — AB (ref 11.5–15.5)
WBC: 18.7 10*3/uL — AB (ref 4.0–10.5)

## 2016-07-27 LAB — DIFFERENTIAL
Basophils Absolute: 0.1 10*3/uL (ref 0.0–0.1)
Basophils Relative: 1 %
EOS ABS: 0.4 10*3/uL (ref 0.0–0.7)
EOS PCT: 2 %
LYMPHS ABS: 3 10*3/uL (ref 0.7–4.0)
Lymphocytes Relative: 15 %
MONO ABS: 3.1 10*3/uL — AB (ref 0.1–1.0)
Monocytes Relative: 16 %
Neutro Abs: 12.9 10*3/uL — ABNORMAL HIGH (ref 1.7–7.7)
Neutrophils Relative %: 66 %

## 2016-07-27 LAB — HEPARIN LEVEL (UNFRACTIONATED): Heparin Unfractionated: 0.34 IU/mL (ref 0.30–0.70)

## 2016-07-27 LAB — APTT
APTT: 36 s (ref 24–36)
APTT: 73 s — AB (ref 24–36)

## 2016-07-27 LAB — RETICULOCYTES
RBC.: 2.92 MIL/uL — ABNORMAL LOW (ref 4.22–5.81)
RETIC CT PCT: 9.7 % — AB (ref 0.4–3.1)
Retic Count, Absolute: 283.2 10*3/uL — ABNORMAL HIGH (ref 19.0–186.0)

## 2016-07-27 MED ORDER — HEPARIN (PORCINE) IN NACL 100-0.45 UNIT/ML-% IJ SOLN: 1800.0000 [IU]/h | INTRAMUSCULAR | Status: DC

## 2016-07-27 MED ORDER — HYDROMORPHONE HCL 1 MG/ML IJ SOLN
1.0000 mg | INTRAMUSCULAR | Status: DC | PRN
  Administered 2016-07-27 – 2016-07-28 (×5): 1 mg via INTRAVENOUS
  Filled 2016-07-27 (×5): qty 1

## 2016-07-27 MED ORDER — RIVAROXABAN 15 MG PO TABS
15.0000 mg | ORAL_TABLET | Freq: Two times a day (BID) | ORAL | Status: DC
  Administered 2016-07-27 – 2016-07-28 (×4): 15 mg via ORAL
  Filled 2016-07-27 (×4): qty 1

## 2016-07-27 MED ORDER — RIVAROXABAN 20 MG PO TABS: 20.0000 mg | ORAL_TABLET | Freq: Every day | ORAL | Status: DC

## 2016-07-27 MED ORDER — SODIUM CHLORIDE 0.9 % IV SOLN
25.0000 mg | Freq: Four times a day (QID) | INTRAVENOUS | Status: DC | PRN
  Administered 2016-07-27: 25 mg via INTRAVENOUS
  Filled 2016-07-27 (×2): qty 0.5

## 2016-07-27 MED ORDER — KETOROLAC TROMETHAMINE 30 MG/ML IJ SOLN
30.0000 mg | Freq: Four times a day (QID) | INTRAMUSCULAR | Status: DC
  Administered 2016-07-27 – 2016-07-28 (×4): 30 mg via INTRAVENOUS
  Filled 2016-07-27 (×4): qty 1

## 2016-07-27 NOTE — Discharge Instructions (Signed)
Information on my medicine - XARELTO (rivaroxaban)  WHY WAS XARELTO PRESCRIBED FOR YOU? Xarelto was prescribed to treat blood clots that may have been found in the veins of your legs (deep vein thrombosis) or in your lungs (pulmonary embolism) and to reduce the risk of them occurring again.  What do you need to know about Xarelto? The starting dose is one 15 mg tablet taken TWICE daily with food for the FIRST 21 DAYS then on (enter date)  August 17, 2016  the dose is changed to one 20 mg tablet taken ONCE A DAY with your evening meal.  DO NOT stop taking Xarelto without talking to the health care provider who prescribed the medication.  Refill your prescription for 20 mg tablets before you run out.  After discharge, you should have regular check-up appointments with your healthcare provider that is prescribing your Xarelto.  In the future your dose may need to be changed if your kidney function changes by a significant amount.  What do you do if you miss a dose? If you are taking Xarelto TWICE DAILY and you miss a dose, take it as soon as you remember. You may take two 15 mg tablets (total 30 mg) at the same time then resume your regularly scheduled 15 mg twice daily the next day.  If you are taking Xarelto ONCE DAILY and you miss a dose, take it as soon as you remember on the same day then continue your regularly scheduled once daily regimen the next day. Do not take two doses of Xarelto at the same time.   Important Safety Information Xarelto is a blood thinner medicine that can cause bleeding. You should call your healthcare provider right away if you experience any of the following: ? Bleeding from an injury or your nose that does not stop. ? Unusual colored urine (red or dark brown) or unusual colored stools (red or black). ? Unusual bruising for unknown reasons. ? A serious fall or if you hit your head (even if there is no bleeding).  Some medicines may interact with Xarelto  and might increase your risk of bleeding while on Xarelto. To help avoid this, consult your healthcare provider or pharmacist prior to using any new prescription or non-prescription medications, including herbals, vitamins, non-steroidal anti-inflammatory drugs (NSAIDs) and supplements.  This website has more information on Xarelto: [email or web address]

## 2016-07-27 NOTE — Progress Notes (Signed)
SICKLE CELL SERVICE PROGRESS NOTE  [name] QIH:[id number] DOB: 1980/11/06 DOA: 07/26/2016 PCP: Tresa Garter, MD  Assessment/Plan: Active Problems:   PE (pulmonary thromboembolism) ([place])   Acute pulmonary embolism (HCC)   Syncope  1. Acute PE: Transition to Xarelto at VTE treatment dose.  2. Syncope: Likely secondary to PE. [facility] input. 3. 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  4. Chronic respiratory failure with Hypoxia: Continue Oxygen supplementation at baseline dose.  5. Chronic Pain Syndrome: Continue MS Contin 6. Anemia of Chronic Disease:Hb at baseline. No indication for transfusion.  7. Non-Adherence to Medical Care: Pt missed several days of Xarelto which resulted in PE.     Code Status: Full Code Family Communication: N/A Disposition Plan: Not yet ready for discharge  [place].  Pager [phone]. If 7PM-7AM, please contact night-coverage.  07/27/2016, 8:19 PM  LOS: 1 day   Interim History: Pt without any c/o. Pt was evaluated by Cardiology who assessed that in setting of RBBB, the OTc is WNL's.   Consultants:  Cardiology  Procedures:  None  Antibiotics:  None    Objective: Vitals:   07/27/16 0631 07/27/16 1037 07/27/16 1401 07/27/16 1818  BP: 97/67 114/65 108/78 112/74  Pulse: 80 81 86 92  Resp: 18 18 20 20   Temp: 98.9 F (37.2 C) 99.3 F (37.4 C) 100.2 F (37.9 C) (!) 100.6 F (38.1 C)  TempSrc: Oral Oral Oral Oral  SpO2: 96% 95% 94% 94%  Weight:      Height:       Weight change: -4.852 kg (-10 lb 11.1 oz)  Intake/Output Summary (Last 24 hours) at 07/27/16 2019 Last data filed at 07/27/16 1600  Gross per 24 hour  Intake             3226 ml  Output             1400 ml  Net             1826 ml     Physical Exam General: Alert, awake, oriented x3, in no acute distress.  HEENT: [place]/AT PEERL, EOMI, anicteric Neck: Trachea  midline,  no masses, no thyromegal,y no JVD, no carotid bruit OROPHARYNX:  Moist, No exudate/ erythema/lesions.  Heart: Regular rate and rhythm, without murmurs, rubs, gallops, PMI non-displaced, no heaves or thrills on palpation.  Lungs: Clear to auscultation, no wheezing or rhonchi noted. No increased vocal fremitus resonant to percussion  Abdomen: Soft, nontender, nondistended, positive bowel sounds, no masses no hepatosplenomegaly noted.  Neuro: No focal neurological deficits noted cranial nerves II through XII grossly intact.  Strength at baseline in bilateral upper and lower extremities. Musculoskeletal: No warmth swelling or erythema around joints, no spinal tenderness noted. Psychiatric: Patient alert and oriented x3, good insight and cognition, good recent to remote recall.     Data Reviewed: Basic Metabolic Panel:  Recent Labs Lab 07/23/16 2216 07/26/16 0119  NA 139 136  K 3.4* 3.3*  CL 110 109  CO2 20* 20*  GLUCOSE 101* 130*  BUN 7 9  CREATININE 0.76 1.23  CALCIUM 9.0 8.9   Liver Function Tests:  Recent Labs Lab 07/23/16 2216 07/26/16 0119  AST 31 34  ALT 27 22  ALKPHOS 97 88  BILITOT 6.9* 7.8*  PROT 7.8 7.7  ALBUMIN 4.4 4.1    Recent Labs Lab 07/26/16 0119  LIPASE 40   No results for input(s): AMMONIA in the last 168 hours.  CBC:  Recent Labs Lab 07/23/16 2216 07/26/16 0119 07/27/16 0528  WBC 19.1* 28.8* 18.7*  NEUTROABS 15.1* 24.2* 12.9*  HGB 9.2* 9.5* 8.9*  HCT 27.5* 28.8* 25.8*  MCV 89.9 90.9 88.1  PLT 650* 542* 433*   Cardiac Enzymes:  Recent Labs Lab 07/26/16 0457  TROPONINI 0.25*   BNP (last 3 results)  Recent Labs  05/16/16 1724 07/01/16 0535 07/26/16 0457  BNP 299.8* 539.3* 494.3*    ProBNP (last 3 results) No results for input(s): PROBNP in the last 8760 hours.  CBG: No results for input(s): GLUCAP in the last 168 hours.  Recent Results (from the past 240 hour(s))  Blood Culture (routine x 2)     Status: None  (Preliminary result)   Collection Time: 07/26/16  7:30 AM  Result Value Ref Range Status   Specimen Description BLOOD RIGHT ANTECUBITAL  Final   Special Requests   Final    BOTTLES DRAWN AEROBIC AND ANAEROBIC Blood Culture adequate volume   Culture NO GROWTH 1 DAY  Final   Report Status PENDING  Incomplete  Blood Culture (routine x 2)     Status: None (Preliminary result)   Collection Time: 07/26/16  7:40 AM  Result Value Ref Range Status   Specimen Description BLOOD RIGHT HAND  Final   Special Requests   Final    BOTTLES DRAWN AEROBIC ONLY Blood Culture adequate volume   Culture NO GROWTH 1 DAY  Final   Report Status PENDING  Incomplete     Studies: Dg Chest 2 View  Result Date: 07/20/2016 CLINICAL DATA:  36 year old male with sickle cell disease. Bilateral rib and leg pain. EXAM: CHEST  2 VIEW COMPARISON:  Chest CTA 07/18/2016 and earlier. FINDINGS: Stable cardiomegaly and mediastinal contours. Stable left subclavian Port-A-Cath, accessed. Stable to mildly improved lung volumes. No pneumothorax. Stable pulmonary vascularity without acute edema. No pleural effusion or confluent pulmonary opacity. Stable cholecystectomy clips. Negative visible bowel gas pattern. Stable visualized osseous structures, osteopenia and also H-type vertebral body changes better demonstrated on the recent CTA. IMPRESSION: Stable cardiomegaly. No acute cardiopulmonary abnormality. Electronically Signed   By: Genevie Ann M.D.   On: 07/20/2016 16:47   Dg Chest 2 View  Result Date: 07/18/2016 CLINICAL DATA:  Bilateral chest and back pain, somnolence. Former smoker. History of sickle cell anemia, pulmonary embolus. EXAM: CHEST  2 VIEW COMPARISON:  Portable chest x-ray of July 14, 2016 FINDINGS: The lungs are well-expanded. There is no focal infiltrate. No significant pleural effusion is visible. The cardiac silhouette remains enlarged. The pulmonary vascularity remains engorged. The pulmonary interstitial markings are  mildly increased over baseline. The power port catheter tip projects over the cavoatrial junction. The mediastinum is normal in width. The bony thorax exhibits no acute abnormality. IMPRESSION: COPD and smoking related changes, stable. Mild pulmonary interstitial prominence bilaterally suggests low-grade CHF. Stable cardiomegaly. Electronically Signed   By: David  Martinique M.D.   On: 07/18/2016 13:05   Dg Chest 2 View  Result Date: 07/03/2016 CLINICAL DATA:  Sickle cell anemia.  Pain crisis. EXAM: CHEST  2 VIEW COMPARISON:  06/29/2016 FINDINGS: Left Port-A-Cath remains in place, unchanged. Cardiomegaly with vascular congestion. No confluent opacities or effusions. No acute bony abnormality. IMPRESSION: Cardiomegaly with vascular congestion. Electronically Signed   By: Rolm Baptise M.D.   On: 07/03/2016 18:51   Dg Chest 2 View  Result Date: 06/29/2016 CLINICAL DATA:  Sickle cell crisis with chest pain and shortness of breath. EXAM: CHEST  2 VIEW COMPARISON:  06/26/2016; 04/29/2016; chest CT - 05/06/2016 FINDINGS: Grossly unchanged enlarged cardiac silhouette and mediastinal contours. Stable position of support apparatus. Suspected slight worsening of linear heterogeneous potential airspace opacities within the right mid lung. Peripheral and basilar heterogeneous opacities are unchanged. No pleural effusion or pneumothorax. No evidence of edema. No acute osseus abnormalities. IMPRESSION: Worsening ill-defined right mid lung heterogeneous opacities could be indicative of early acute chest syndrome given provided history of sickle cell disease. Electronically Signed   By: Sandi Mariscal M.D.   On: 06/29/2016 12:37   Ct Angio Chest Pe W And/or Wo Contrast  Result Date: 07/26/2016 CLINICAL DATA:  36 y/o M; rib pain, abdominal pain, jaundice, history of sickle cell, and history of asplenia. EXAM: CT ANGIOGRAPHY CHEST WITH CONTRAST TECHNIQUE: Multidetector CT imaging of the chest was performed using the standard  protocol during bolus administration of intravenous contrast. Multiplanar CT image reconstructions and MIPs were obtained to evaluate the vascular anatomy. CONTRAST:  100 cc Isovue 370 COMPARISON:  Concurrent CT angiogram of the abdomen and pelvis. 07/18/2016 CT angiogram of the chest. FINDINGS: Cardiovascular: Enlarged main pulmonary artery measuring 4.2 cm. Normal caliber thoracic aorta. Stable severe cardiomegaly. Left port catheter tip projects within the right atrium. No pericardial effusion. Multiple segmental acute pulmonary emboli within the lungs bilaterally best appreciated in the right lower lobe (series 7, image 151). Mediastinum/Nodes: No enlarged mediastinal, hilar, or axillary lymph nodes. Thyroid gland, trachea, and esophagus demonstrate no significant findings. Lungs/Pleura: Stable 6 mm nodule in the right middle lobe. Septal thickening of the lung bases and right perihilar ground-glass opacities probably represents mild pulmonary edema. No pleural effusion. Upper Abdomen: Calcified atrophic spleen. Musculoskeletal: H shaped vertebral body compatible with bony stigmata of sickle cell disease. Review of the MIP images confirms the above findings. IMPRESSION: 1. Multiple segmental acute pulmonary emboli. 2. Stable severe cardiomegaly. 3. Enlarged main pulmonary artery indicates pulmonary artery hypertension. 4. Mild pulmonary edema. 5. Calcified atrophic spleen. These results were called by telephone at the time of interpretation on 07/26/2016 at 3:34 am to Dr. Abigail Butts , who verbally acknowledged these results. Electronically Signed   By: Kristine Garbe M.D.   On: 07/26/2016 03:36   Ct Angio Chest Pe W Or Wo Contrast  Addendum Date: 07/18/2016   ADDENDUM REPORT: 07/18/2016 19:21 ADDENDUM: Add to IMPRESSION: Suspect a degree of underlying pulmonary arterial hypertension given prominence in the main pulmonary outflow tract. Electronically Signed   By: Lowella Grip III M.D.    On: 07/18/2016 19:21   Result Date: 07/18/2016 CLINICAL DATA:  Chest pain.  History of sickle cell disease EXAM: CT ANGIOGRAPHY CHEST WITH CONTRAST TECHNIQUE: Multidetector CT imaging of the chest was performed using the standard protocol during bolus administration of intravenous contrast. Multiplanar CT image reconstructions and MIPs were obtained to evaluate the vascular anatomy. CONTRAST:  90 mL Isovue 370 nonionic COMPARISON:  Chest radiograph July 18, 2016; chest CT May 06, 2016 FINDINGS: Cardiovascular: There is a pulmonary embolus in a posterior segment right lower lobe pulmonary artery branch, similar to prior study. No more central pulmonary embolus evident. There is not felt to be of right heart strain. Heart is diffusely enlarged. There is a small pericardial effusion. There is no thoracic aortic aneurysm or dissection. Port-A-Cath tip is at the cavoatrial junction. Main pulmonary outflow tract measures 3.8 cm in diameter consistent with pulmonary arterial hypertension. Mediastinum/Nodes: Visualized thyroid appears normal. There are multiple mediastinal lymph nodes which appear similar to the previous study. There is  not felt to be frank adenopathy by size criteria. Lungs/Pleura: There is bibasilar atelectasis. There is interstitial pulmonary edema. There is a stable nodular opacity in the right middle lobe seen on axial slice 51 series 11 measuring 3 mm. There is a stable nodular opacity in the lateral segment of the right middle lobe measuring 6 x 6 mm, ground-glass in appearance, best seen on axial slice 51 series 11. There is no appreciable pleural effusion or pleural thickening. Upper Abdomen: Spleen is small and calcified consistent with prior infarct from sickle cell disease. There is reflux of contrast into the inferior vena cava and hepatic veins. Visualized upper abdominal structures otherwise appear unremarkable. Musculoskeletal: There are endplate infarcts and multiple thoracic spine  levels. Multiple areas of sclerosis noted consistent with sickle cell disease. No appreciable extramedullary hematopoiesis noted. Review of the MIP images confirms the above findings. IMPRESSION: Small pulmonary embolus in a right lower lobe pulmonary artery branch, also present on most recent study. Suspect small chronic pulmonary embolus focus. No new pulmonary embolus compared to most recent study. Findings felt to be indicative of a degree of congestive heart failure. Reflux of contrast into the hepatic veins and inferior vena cava may indicate increase in right heart pressure. Nodular opacities, largest measuring 6 x 6 mm, ground-glass in appearance. This nodular opacity noted in right middle lobe. Initial follow-up with CT at 6-12 months is recommended to confirm persistence. If persistent, repeat CT is recommended every 2 years until 5 years of stability has been established. This recommendation follows the consensus statement: Guidelines for Management of Incidental Pulmonary Nodules Detected on CT Images: From the Fleischner Society 2017; Radiology 2017; 284:228-243. Multiple small lymph nodes, stable. Bony changes and splenic appearance consistent with known sickle cell disease. Critical Value/emergent results were called by telephone at the time of interpretation on 07/18/2016 at 4:18 pm to Dr. Jola Schmidt , who verbally acknowledged these results. Electronically Signed: By: Lowella Grip III M.D. On: 07/18/2016 16:19   Dg Chest Port 1 View  Result Date: 07/26/2016 CLINICAL DATA:  Awakened with chest pain. History of sickle cell disease. EXAM: PORTABLE CHEST 1 VIEW COMPARISON:  07/20/2016 FINDINGS: Unchanged prominent cardiac silhouette. The lungs are clear. No pleural effusions. No pneumothorax. Hilar and mediastinal contours are unremarkable and unchanged. Left-sided port appears intact, extending into the low SVC. IMPRESSION: Enlarged cardiac silhouette without significant interval change. No  consolidation or effusion. Electronically Signed   By: Andreas Newport M.D.   On: 07/26/2016 01:52   Dg Chest Port 1 View  Result Date: 07/14/2016 CLINICAL DATA:  36 year old male with history of sickle cell disease presenting with bilateral chest pain, body pain and shortness of breath today. EXAM: PORTABLE CHEST 1 VIEW COMPARISON:  Chest x-ray 07/03/2016. FINDINGS: Mild blunting of the left costophrenic sulcus and obscuration of the lateral left hemidiaphragm suggesting a small left pleural effusion. No right pleural effusion. No definite consolidative airspace disease. Scattered areas of linear scarring in the lung bases, similar to prior studies. Cephalization of the pulmonary vasculature, without frank pulmonary edema. No pneumothorax. Heart size is mildly enlarged. Upper mediastinal contours are within normal limits. Left-sided subclavian single-lumen power porta cath with tip terminating at the superior cavoatrial junction. IMPRESSION: 1. New small left pleural effusion. 2. Chronic scarring in the lung bases bilaterally. 3. Cardiomegaly with pulmonary venous congestion, but no frank pulmonary edema. Electronically Signed   By: Vinnie Langton M.D.   On: 07/14/2016 08:07   Ct Angio Abdomen Pelvis  W &/or Wo Contrast  Result Date: 07/26/2016 CLINICAL DATA:  Chest wall pain and abdominal pain. Sickle cell disease. EXAM: CTA ABDOMEN AND PELVIS wITHOUT AND WITH CONTRAST TECHNIQUE: Multidetector CT imaging of the abdomen and pelvis was performed using the standard protocol during bolus administration of intravenous contrast. Multiplanar reconstructed images and MIPs were obtained and reviewed to evaluate the vascular anatomy. CONTRAST:  150 mL Isovue 370 intravenous COMPARISON:  12/15/2015 FINDINGS: VASCULAR Aorta: Normal caliber with mild atherosclerotic calcification. No dissection or aneurysm. Celiac: Patent without evidence of aneurysm, dissection, vasculitis or significant stenosis. Small calcified  spleen. Splenic artery is diminutive, possibly occluded. SMA: Patent without evidence of aneurysm, dissection, vasculitis or significant stenosis. Renals: Both renal arteries are patent without evidence of aneurysm, dissection, vasculitis, fibromuscular dysplasia or significant stenosis. IMA: Patent without evidence of aneurysm, dissection, vasculitis or significant stenosis. Inflow: Normal caliber and intact. Mild atherosclerotic calcification. Proximal Outflow: Bilateral common femoral and visualized portions of the superficial and profunda femoral arteries are patent without evidence of aneurysm, dissection, vasculitis or significant stenosis. Veins: No obvious venous abnormality within the limitations of this arterial phase study. Review of the MIP images confirms the above findings. NON-VASCULAR Hepatobiliary: Cholecystectomy.  No focal liver lesion. Pancreas: Unremarkable. No pancreatic ductal dilatation or surrounding inflammatory changes. Spleen: Small calcified spleen, typical of sickle cell disease. Adrenals/Urinary Tract: Both adrenals are normal. Scattered renal parenchymal scars. Collecting systems and ureters are unremarkable. Urinary bladder is unremarkable. Stomach/Bowel: Stomach is within normal limits. Appendix appears normal. No evidence of bowel wall thickening, distention, or inflammatory changes. Lymphatic: No adenopathy in the abdomen or pelvis. Reproductive: Unremarkable Other: Umbilical hernia containing unobstructed small bowel. 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 No significant skeletal lesion. IMPRESSION: VASCULAR 1. Aortic atherosclerosis. 2. No aneurysm or dissection. Major branches of the aorta are also patent and normal in caliber. NON-VASCULAR 1. Small calcified spleen, typical of sickle cell disease. 2. Unobstructed small bowel within an umbilical hernia. 3. No acute findings are evident in the abdomen or pelvis. Electronically Signed   By:  Andreas Newport M.D.   On: 07/26/2016 04:00    Scheduled Meds: . carvedilol  3.125 mg Oral BID WC  . cholecalciferol  2,000 Units Oral Daily  . folic acid  2 mg Oral Daily  . gabapentin  300 mg Oral TID  . hydroxyurea  1,000 mg Oral QODAY  . hydroxyurea  500 mg Oral QODAY  . ketorolac  30 mg Intravenous Q6H  . L-glutamine  15 g Oral BID  . lisinopril  2.5 mg Oral QHS  . morphine  30 mg Oral Q12H  . potassium chloride SA  40 mEq Oral Daily  . Rivaroxaban  15 mg Oral BID WC  . [START ON 08/17/2016] rivaroxaban  20 mg Oral Q supper  . sodium chloride flush  3 mL Intravenous Q12H   Continuous Infusions: . dextrose 5 % and 0.45% NaCl 100 mL/hr at 07/27/16 1731    Active Problems:   PE (pulmonary thromboembolism) (HCC)   Acute pulmonary embolism (HCC)   Syncope   In excess of 25 minutes spent during this visit. Greater than 50% involved face to face contact with the patient for assessment, counseling and coordination of care.

## 2016-07-27 NOTE — Progress Notes (Signed)
[name], NP notified regarding pt's request for Benadryl for itching. Will continue to monitor closely and carry out any new orders. [name] I

## 2016-07-27 NOTE — Progress Notes (Addendum)
ANTICOAGULATION CONSULT NOTE - Follow Up Consult  Pharmacy Consult for Heparin --> Xarelto Indication: pulmonary embolus  No Known Allergies  Patient Measurements: Height: 6' (182.9 cm) Weight: 184 lb 4.9 oz (83.6 kg) IBW/kg (Calculated) : 77.6  Vital Signs: Temp: 98.9 F (37.2 C) (06/27 0631) Temp Source: Oral (06/27 0631) BP: 97/67 (06/27 0631) Pulse Rate: 80 (06/27 0631)  Labs:  Recent Labs  07/26/16 0119 07/26/16 0457 07/26/16 1512 07/26/16 2340 07/27/16 0528  HGB 9.5*  --   --   --  8.9*  HCT 28.8*  --   --   --  25.8*  PLT 542*  --   --   --  433*  APTT 55*  --  56* 73* 36  LABPROT 34.2*  --   --   --   --   INR 3.29  --   --   --   --   HEPARINUNFRC  --   --  0.96*  --  0.34  CREATININE 1.23  --   --   --   --   TROPONINI  --  0.25*  --   --   --     Estimated Creatinine Clearance: 90.3 mL/min (by C-G formula based on SCr of 1.23 mg/dL).    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  MD documents non-compliance with Xarelto.  Placed on heparin infusion.  Cardiology recommends resuming Xarelto at discharge. Per discussion with Dr. Zigmund Daniel, continuing heparin infusion for now while working up the syncopal episode and elevated troponin.  Monitoring aPTT with heparin levels since patient reported taking Xarelto on 6/25 and this can affect heparin level.  Today, 07/27/2016: - aPTT subtherapeutic, heparin level 0.34 which is at low end of therapeutic range but does not correlate to the low aPTT so likely Xarelto effects have not yet diminished. RN reports no interruptions in the infusion and no complications or bleeding. - Hgb stable, platelets slightly high  Goal of Therapy:  APTT 66-102 while Xarelto affecting heparin level  Heparin level 0.3-0.7 units/ml Monitor platelets by anticoagulation protocol: Yes   Plan:  Increase heparin infusion at 1800 units/hr. Recheck heparin level and aPTT in 6 hours.   Hershal Coria 07/27/2016,7:44 AM   ADDENDUM: 07/27/2016 11:59 AM Consulted to transition patient back to Xarelto. CrCl~90 ml/min  Plan:  Stop heparin infusion. Start Xarelto 15 mg BID with meals x 21 days followed by 20 mg daily with meal.  Hershal Coria, PharmD, BCPS Pager: [phone] 07/27/2016 11:59 AM

## 2016-07-27 NOTE — Progress Notes (Signed)
Brief Pharmacy Note-IV heparin  In brief, this is a 36 y/oM on IV heparin infusion for acute PE. See note by Hershal Coria, PharmD, for full details.  Assessment:  aPTT = 73 seconds, therapeutic on heparin infusion at 1550 units/hr  No bleeding or infusion issues reported per nursing   Plan:   Given recent Xarelto use, heparin level is falsely elevated. Therefore, will adjust heparin infusion based on aPTT at this time until both heparin level and aPTT correlate and effects of Xarelto on heparin level have diminished.  Continue heparin infusion at 1550 units/hr.  Daily CBC, heparin level, aPTT.   Monitor for s/sx of bleeding.  F/u transition to oral anticoagulation.    Netta Cedars, PharmD, BCPS Pager: [phone] 07/27/2016@12 :34 AM

## 2016-07-28 DIAGNOSIS — R55 Syncope and collapse: Secondary | ICD-10-CM | POA: Diagnosis not present

## 2016-07-28 DIAGNOSIS — I340 Nonrheumatic mitral (valve) insufficiency: Secondary | ICD-10-CM | POA: Diagnosis not present

## 2016-07-28 DIAGNOSIS — I2699 Other pulmonary embolism without acute cor pulmonale: Secondary | ICD-10-CM | POA: Diagnosis not present

## 2016-07-28 DIAGNOSIS — G894 Chronic pain syndrome: Secondary | ICD-10-CM | POA: Diagnosis not present

## 2016-07-28 LAB — BASIC METABOLIC PANEL
ANION GAP: 6 (ref 5–15)
BUN: 12 mg/dL (ref 6–20)
CALCIUM: 8.3 mg/dL — AB (ref 8.9–10.3)
CO2: 20 mmol/L — ABNORMAL LOW (ref 22–32)
Chloride: 109 mmol/L (ref 101–111)
Creatinine, Ser: 0.77 mg/dL (ref 0.61–1.24)
GFR calc Af Amer: >60 mL/min (ref >60)
Glucose, Bld: 127 mg/dL — ABNORMAL HIGH (ref 65–99)
POTASSIUM: 4.2 mmol/L (ref 3.5–5.1)
SODIUM: 135 mmol/L (ref 135–145)

## 2016-07-28 MED ORDER — HEPARIN SOD (PORK) LOCK FLUSH 100 UNIT/ML IV SOLN: 500.0000 [IU] | INTRAVENOUS | Status: DC

## 2016-07-28 MED ORDER — HEPARIN SOD (PORK) LOCK FLUSH 100 UNIT/ML IV SOLN
500.0000 [IU] | INTRAVENOUS | Status: DC | PRN
  Administered 2016-07-28: 500 [IU]

## 2016-07-28 MED ORDER — RIVAROXABAN (XARELTO) VTE STARTER PACK (15 & 20 MG): ORAL_TABLET | ORAL | 0 refills | Status: AC

## 2016-07-28 NOTE — Discharge Summary (Signed)
[name] MRN: [id number] DOB/AGE: 1980/12/16 36 y.o.  Admit date: 07/26/2016 Discharge date: 07/28/2016  Primary Care Physician:  Tresa Garter, MD   Discharge Diagnoses:   Patient Active Problem List   Diagnosis Date Noted  . Cor pulmonale, chronic ([place]) 08/25/2014    Priority: High  . Pulmonary HTN ([place]) 06/18/2013    Priority: High  . Functional asplenia     Priority: High  . Anemia of chronic disease 06/25/2014    Priority: Medium  . Chronic respiratory failure with hypoxia ([place]) 03/14/2014    Priority: Medium  . Chronic anticoagulation 08/22/2013    Priority: Medium  . Avascular necrosis ([facility])     Priority: Low  . PE (pulmonary thromboembolism) ([place]) 07/26/2016  . Acute pulmonary embolism ([place]) 07/26/2016  . Syncope 07/26/2016  . Acute pain 07/21/2016  . Sickle cell crisis ([place]) 02/23/2016  . H/O arterial ischemic stroke 11/11/2015  . Slurred speech 11/10/2015  . Chronic anemia   . Hb-SS disease without crisis ([place]) 10/07/2014  . Prolonged Q-T interval on ECG 09/10/2014  . Sickle cell anemia ([place]) 06/25/2014  . PAH (pulmonary artery hypertension) ([place]) 03/18/2014  . Paralytic strabismus, external ophthalmoplegia   . Chronic pain syndrome 12/12/2013  . Essential hypertension 08/22/2013  . Vitamin D deficiency 02/13/2013  . Hx of pulmonary embolus 06/29/2012  . 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   cholecalciferol 1000 units tablet Commonly known as:  VITAMIN D Take 2,000 Units by mouth daily.   folic acid 1 MG tablet Commonly known as:  FOLVITE Take 2  tablets (2 mg total) by mouth daily.   gabapentin 300 MG capsule Commonly known as:  NEURONTIN Take 1 capsule (300 mg total) by mouth 3 (three) times daily.   hydroxyurea 500 MG capsule Commonly known as:  HYDREA Take 1,000 mg by mouth every other day. May take with food to minimize GI side effects.   hydroxyurea 500 MG capsule Commonly known as:  HYDREA Take 500 mg by mouth every other day. May take with food to minimize GI side effects.   L-glutamine 5 g Pack Powder Packet Commonly known as:  ENDARI Take 15 g by mouth 2 (two) times daily.   lisinopril 2.5 MG tablet Commonly known as:  PRINIVIL,ZESTRIL Take 2.5 mg by mouth at bedtime.   morphine 30 MG 12 hr tablet Commonly known as:  MS CONTIN Take 1 tablet (30 mg total) by mouth every 12 (twelve) hours.   potassium chloride SA 20 MEQ tablet Commonly known as:  K-DUR,KLOR-CON Take 2 tablets (40 mEq total) by mouth daily.   Rivaroxaban 15 & 20 MG Tbpk Take as directed on package: Start with one 15mg  tablet by mouth twice a day with food. On Day 22, switch to one 20mg  tablet once a day with food. Replaces:  rivaroxaban 20 MG Tabs tablet   torsemide 20 MG tablet Commonly known as:  DEMADEX Take 1 tablet (20 mg total) by mouth daily as needed (leg swelling).   zolpidem 10 MG tablet Commonly known as:  AMBIEN Take 1 tablet (10 mg total) by mouth at bedtime. What changed:  when to take this  reasons to take this         Consults: Treatment Team:  [name], MD   SIGNIFICANT DIAGNOSTIC STUDIES:  Dg Chest 2 View  Result Date: 07/20/2016 CLINICAL DATA:  36 year old male with sickle cell disease. Bilateral rib and leg pain. EXAM: CHEST  2 VIEW COMPARISON:  Chest CTA 07/18/2016 and earlier. FINDINGS: Stable cardiomegaly and mediastinal contours. Stable left subclavian Port-A-Cath, accessed. Stable to mildly improved lung volumes. No pneumothorax. Stable pulmonary vascularity without acute edema. No pleural  effusion or confluent pulmonary opacity. Stable cholecystectomy clips. Negative visible bowel gas pattern. Stable visualized osseous structures, osteopenia and also H-type vertebral body changes better demonstrated on the recent CTA. IMPRESSION: Stable cardiomegaly. No acute cardiopulmonary abnormality. Electronically Signed   By: Genevie Ann M.D.   On: 07/20/2016 16:47   Dg Chest 2 View  Result Date: 07/18/2016 CLINICAL DATA:  Bilateral chest and back pain, somnolence. Former smoker. History of sickle cell anemia, pulmonary embolus. EXAM: CHEST  2 VIEW COMPARISON:  Portable chest x-ray of July 14, 2016 FINDINGS: The lungs are well-expanded. There is no focal infiltrate. No significant pleural effusion is visible. The cardiac silhouette remains enlarged. The pulmonary vascularity remains engorged. The pulmonary interstitial markings are mildly increased over baseline. The power port catheter tip projects over the cavoatrial junction. The mediastinum is normal in width. The bony thorax exhibits no acute abnormality. IMPRESSION: COPD and smoking related changes, stable. Mild pulmonary interstitial prominence bilaterally suggests low-grade CHF. Stable cardiomegaly. Electronically Signed   By: David  Martinique M.D.   On: 07/18/2016 13:05   Dg Chest 2 View  Result Date: 07/03/2016 CLINICAL DATA:  Sickle cell anemia.  Pain crisis. EXAM: CHEST  2 VIEW COMPARISON:  06/29/2016 FINDINGS: Left Port-A-Cath remains in place, unchanged. Cardiomegaly with vascular congestion. No confluent opacities or effusions. No acute bony abnormality. IMPRESSION: Cardiomegaly with vascular congestion. Electronically Signed   By: Rolm Baptise M.D.   On: 07/03/2016 18:51   Dg Chest 2 View  Result Date: 06/29/2016 CLINICAL DATA:  Sickle cell crisis with chest pain and shortness of breath. EXAM: CHEST  2 VIEW COMPARISON:  06/26/2016; 04/29/2016; chest CT - 05/06/2016 FINDINGS: Grossly unchanged enlarged cardiac silhouette and mediastinal  contours. Stable position of support apparatus. Suspected slight worsening of linear heterogeneous potential airspace opacities within the right mid lung. Peripheral and basilar heterogeneous opacities are unchanged. No pleural effusion or pneumothorax. No evidence of edema. No acute osseus abnormalities. IMPRESSION: Worsening ill-defined right mid lung heterogeneous opacities could be indicative of early acute chest syndrome given provided history of sickle cell disease. Electronically Signed   By: Sandi Mariscal M.D.   On: 06/29/2016 12:37   Ct Angio Chest Pe W And/or Wo Contrast  Result Date: 07/26/2016 CLINICAL DATA:  36 y/o M; rib pain, abdominal pain, jaundice, history of sickle cell, and history of asplenia. EXAM: CT ANGIOGRAPHY CHEST WITH CONTRAST TECHNIQUE: Multidetector CT imaging of the chest was performed using the standard protocol during bolus administration of intravenous contrast. Multiplanar CT image reconstructions and MIPs were obtained to evaluate the vascular anatomy. CONTRAST:  100 cc Isovue 370 COMPARISON:  Concurrent CT angiogram of the abdomen and pelvis. 07/18/2016 CT angiogram of the chest. FINDINGS: Cardiovascular: Enlarged main pulmonary artery measuring 4.2 cm. Normal caliber thoracic aorta. Stable severe cardiomegaly. Left port catheter tip projects within the right atrium. No pericardial effusion. Multiple segmental  acute pulmonary emboli within the lungs bilaterally best appreciated in the right lower lobe (series 7, image 151). Mediastinum/Nodes: No enlarged mediastinal, hilar, or axillary lymph nodes. Thyroid gland, trachea, and esophagus demonstrate no significant findings. Lungs/Pleura: Stable 6 mm nodule in the right middle lobe. Septal thickening of the lung bases and right perihilar ground-glass opacities probably represents mild pulmonary edema. No pleural effusion. Upper Abdomen: Calcified atrophic spleen. Musculoskeletal: H shaped vertebral body compatible with bony  stigmata of sickle cell disease. Review of the MIP images confirms the above findings. IMPRESSION: 1. Multiple segmental acute pulmonary emboli. 2. Stable severe cardiomegaly. 3. Enlarged main pulmonary artery indicates pulmonary artery hypertension. 4. Mild pulmonary edema. 5. Calcified atrophic spleen. These results were called by telephone at the time of interpretation on 07/26/2016 at 3:34 am to Dr. Abigail Butts , who verbally acknowledged these results. Electronically Signed   By: Kristine Garbe M.D.   On: 07/26/2016 03:36   Ct Angio Chest Pe W Or Wo Contrast  Addendum Date: 07/18/2016   ADDENDUM REPORT: 07/18/2016 19:21 ADDENDUM: Add to IMPRESSION: Suspect a degree of underlying pulmonary arterial hypertension given prominence in the main pulmonary outflow tract. Electronically Signed   By: Lowella Grip III M.D.   On: 07/18/2016 19:21   Result Date: 07/18/2016 CLINICAL DATA:  Chest pain.  History of sickle cell disease EXAM: CT ANGIOGRAPHY CHEST WITH CONTRAST TECHNIQUE: Multidetector CT imaging of the chest was performed using the standard protocol during bolus administration of intravenous contrast. Multiplanar CT image reconstructions and MIPs were obtained to evaluate the vascular anatomy. CONTRAST:  90 mL Isovue 370 nonionic COMPARISON:  Chest radiograph July 18, 2016; chest CT May 06, 2016 FINDINGS: Cardiovascular: There is a pulmonary embolus in a posterior segment right lower lobe pulmonary artery branch, similar to prior study. No more central pulmonary embolus evident. There is not felt to be of right heart strain. Heart is diffusely enlarged. There is a small pericardial effusion. There is no thoracic aortic aneurysm or dissection. Port-A-Cath tip is at the cavoatrial junction. Main pulmonary outflow tract measures 3.8 cm in diameter consistent with pulmonary arterial hypertension. Mediastinum/Nodes: Visualized thyroid appears normal. There are multiple mediastinal lymph  nodes which appear similar to the previous study. There is not felt to be frank adenopathy by size criteria. Lungs/Pleura: There is bibasilar atelectasis. There is interstitial pulmonary edema. There is a stable nodular opacity in the right middle lobe seen on axial slice 51 series 11 measuring 3 mm. There is a stable nodular opacity in the lateral segment of the right middle lobe measuring 6 x 6 mm, ground-glass in appearance, best seen on axial slice 51 series 11. There is no appreciable pleural effusion or pleural thickening. Upper Abdomen: Spleen is small and calcified consistent with prior infarct from sickle cell disease. There is reflux of contrast into the inferior vena cava and hepatic veins. Visualized upper abdominal structures otherwise appear unremarkable. Musculoskeletal: There are endplate infarcts and multiple thoracic spine levels. Multiple areas of sclerosis noted consistent with sickle cell disease. No appreciable extramedullary hematopoiesis noted. Review of the MIP images confirms the above findings. IMPRESSION: Small pulmonary embolus in a right lower lobe pulmonary artery branch, also present on most recent study. Suspect small chronic pulmonary embolus focus. No new pulmonary embolus compared to most recent study. Findings felt to be indicative of a degree of congestive heart failure. Reflux of contrast into the hepatic veins and inferior vena cava may indicate increase in right heart pressure. Nodular opacities,  largest measuring 6 x 6 mm, ground-glass in appearance. This nodular opacity noted in right middle lobe. Initial follow-up with CT at 6-12 months is recommended to confirm persistence. If persistent, repeat CT is recommended every 2 years until 5 years of stability has been established. This recommendation follows the consensus statement: Guidelines for Management of Incidental Pulmonary Nodules Detected on CT Images: From the Fleischner Society 2017; Radiology 2017; 284:228-243.  Multiple small lymph nodes, stable. Bony changes and splenic appearance consistent with known sickle cell disease. Critical Value/emergent results were called by telephone at the time of interpretation on 07/18/2016 at 4:18 pm to Dr. Jola Schmidt , who verbally acknowledged these results. Electronically Signed: By: Lowella Grip III M.D. On: 07/18/2016 16:19   Dg Chest Port 1 View  Result Date: 07/26/2016 CLINICAL DATA:  Awakened with chest pain. History of sickle cell disease. EXAM: PORTABLE CHEST 1 VIEW COMPARISON:  07/20/2016 FINDINGS: Unchanged prominent cardiac silhouette. The lungs are clear. No pleural effusions. No pneumothorax. Hilar and mediastinal contours are unremarkable and unchanged. Left-sided port appears intact, extending into the low SVC. IMPRESSION: Enlarged cardiac silhouette without significant interval change. No consolidation or effusion. Electronically Signed   By: Andreas Newport M.D.   On: 07/26/2016 01:52   Dg Chest Port 1 View  Result Date: 07/14/2016 CLINICAL DATA:  36 year old male with history of sickle cell disease presenting with bilateral chest pain, body pain and shortness of breath today. EXAM: PORTABLE CHEST 1 VIEW COMPARISON:  Chest x-ray 07/03/2016. FINDINGS: Mild blunting of the left costophrenic sulcus and obscuration of the lateral left hemidiaphragm suggesting a small left pleural effusion. No right pleural effusion. No definite consolidative airspace disease. Scattered areas of linear scarring in the lung bases, similar to prior studies. Cephalization of the pulmonary vasculature, without frank pulmonary edema. No pneumothorax. Heart size is mildly enlarged. Upper mediastinal contours are within normal limits. Left-sided subclavian single-lumen power porta cath with tip terminating at the superior cavoatrial junction. IMPRESSION: 1. New small left pleural effusion. 2. Chronic scarring in the lung bases bilaterally. 3. Cardiomegaly with pulmonary venous  congestion, but no frank pulmonary edema. Electronically Signed   By: Vinnie Langton M.D.   On: 07/14/2016 08:07   Ct Angio Abdomen Pelvis  W &/or Wo Contrast  Result Date: 07/26/2016 CLINICAL DATA:  Chest wall pain and abdominal pain. Sickle cell disease. EXAM: CTA ABDOMEN AND PELVIS wITHOUT AND WITH CONTRAST TECHNIQUE: Multidetector CT imaging of the abdomen and pelvis was performed using the standard protocol during bolus administration of intravenous contrast. Multiplanar reconstructed images and MIPs were obtained and reviewed to evaluate the vascular anatomy. CONTRAST:  150 mL Isovue 370 intravenous COMPARISON:  12/15/2015 FINDINGS: VASCULAR Aorta: Normal caliber with mild atherosclerotic calcification. No dissection or aneurysm. Celiac: Patent without evidence of aneurysm, dissection, vasculitis or significant stenosis. Small calcified spleen. Splenic artery is diminutive, possibly occluded. SMA: Patent without evidence of aneurysm, dissection, vasculitis or significant stenosis. Renals: Both renal arteries are patent without evidence of aneurysm, dissection, vasculitis, fibromuscular dysplasia or significant stenosis. IMA: Patent without evidence of aneurysm, dissection, vasculitis or significant stenosis. Inflow: Normal caliber and intact. Mild atherosclerotic calcification. Proximal Outflow: Bilateral common femoral and visualized portions of the superficial and profunda femoral arteries are patent without evidence of aneurysm, dissection, vasculitis or significant stenosis. Veins: No obvious venous abnormality within the limitations of this arterial phase study. Review of the MIP images confirms the above findings. NON-VASCULAR Hepatobiliary: Cholecystectomy.  No focal liver lesion. Pancreas: Unremarkable. No pancreatic  ductal dilatation or surrounding inflammatory changes. Spleen: Small calcified spleen, typical of sickle cell disease. Adrenals/Urinary Tract: Both adrenals are normal. Scattered  renal parenchymal scars. Collecting systems and ureters are unremarkable. Urinary bladder is unremarkable. Stomach/Bowel: Stomach is within normal limits. Appendix appears normal. No evidence of bowel wall thickening, distention, or inflammatory changes. Lymphatic: No adenopathy in the abdomen or pelvis. Reproductive: Unremarkable Other: Umbilical hernia containing unobstructed small bowel. 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 No significant skeletal lesion. IMPRESSION: VASCULAR 1. Aortic atherosclerosis. 2. No aneurysm or dissection. Major branches of the aorta are also patent and normal in caliber. NON-VASCULAR 1. Small calcified spleen, typical of sickle cell disease. 2. Unobstructed small bowel within an umbilical hernia. 3. No acute findings are evident in the abdomen or pelvis. Electronically Signed   By: Andreas Newport M.D.   On: 07/26/2016 04:00     ECHO:   Impressions:  - Compared to a prior study in 04/2015, the LVEF is lower at 50-55%   with new inferior and inferoapical akinesis. There is as new   findings of moderate to severe posteriorly directed mitral   regurgitation, with a suggestion of ischemic tethering of the   posterior leaflet. Recommend further evaluation with TEE.   Recent Results (from the past 240 hour(s))  Blood Culture (routine x 2)     Status: None (Preliminary result)   Collection Time: 07/26/16  7:30 AM  Result Value Ref Range Status   Specimen Description BLOOD RIGHT ANTECUBITAL  Final   Special Requests   Final    BOTTLES DRAWN AEROBIC AND ANAEROBIC Blood Culture adequate volume   Culture NO GROWTH 1 DAY  Final   Report Status PENDING  Incomplete  Blood Culture (routine x 2)     Status: None (Preliminary result)   Collection Time: 07/26/16  7:40 AM  Result Value Ref Range Status   Specimen Description BLOOD RIGHT HAND  Final   Special Requests   Final    BOTTLES DRAWN AEROBIC ONLY Blood Culture adequate volume    Culture NO GROWTH 1 DAY  Final   Report Status PENDING  Incomplete    BRIEF ADMITTING H & P: This is a patient with 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 According to patient he was asleep and had a sudden onset of B/L rib pain which awoke him. He got up to go to the bathroom and took his Oxygen off. He reports that he had no warning of dizziness or lightheadedness before having the syncopal episode but did have 2 episodes of emesis prior to "passing out". He is unsure of the amount of time that he was unresponsive. EMS was called and transported him to the ED. He recalls that he had regained consciousness prior to the EMS arriving but was feeling weak. EMS reports a low BP (60/40) and desaturation at the scene however, with placement in the Trendelenburg position he had improvement in mental status and BP. On arrival to the ED his BO was 111/78 and saturations were 88% on RA. Evaluation in the ED showed multiple segmental acute PE. Pt admits to having missed 3 days of taking Xarelto. 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  He has taken his  pain medications as prescribed for chronic pain.   In the ED he is afebrile and Oxygenation is at baseline. His BP is marginal and he has received several doses of Dilaudid and pain is still persistent. I am asked to admit patient for acute PE.    Hospital Course:  Present on Admission: . PE (pulmonary thromboembolism) ([place]) . Acute pulmonary embolism ([place]) . Syncope  Pt was admitted after syncopal episode and found to have Acute PE. His QTc was also prolonged in the setting of a RBBB. After evaluation, the syncope was felt to be secondary to the PE. He was started on a Heparin gtt and then transitioned to Xarelto at  VTE treatment dose.   He has chronic respiratory failure with hypoxia and the saturations remained at baseline with his usual 3 l/min of Oxygen. He is discharged home on Oxygen 3 L/min ATC.   A 2-D ECHO was performed and findings suggested that he may have ischemia tethering of posterior leaflet which would be better defined by TEE. However patient wanted to defer the TEE until he is remote from the acute PE. I discussed with Cardiology who recommended referral to cardiology in the ambulatory setting. Will defer to his PMD.   Pt was also continued on his medications for chronic conditions without interruption. .   Disposition and Follow-up: Pt discharged home in stable condition and is to follow up with Primary Provider. He will require referral to Cardiology to address findings on 2-D ECHO.    DISCHARGE EXAM:  General: Alert, awake, oriented x3, in no apparent distress.  HEENT: [place]/AT PEERL, EOMI, anicteric Neck: Trachea midline, no masses, no thyromegal,y no JVD, no carotid bruit OROPHARYNX: Moist, No exudate/ erythema/lesions.  Heart: Regular rate and rhythm, II/VI non-radiating systolic ejection murmurs. No rubs, gallops or S3. PMI non-displaced. Exam reveals no decreased pulses. Pulmonary/Chest: Normal effort. Breath sounds normal. No. Apnea. Clear to auscultation,no stridor,  no wheezing and no rhonchi noted. No respiratory distress and no tenderness noted. Abdomen: Soft, nontender, nondistended, normal bowel sounds, no masses no hepatosplenomegaly noted. No fluid wave and no ascites. There is no guarding or rebound. Neuro: Alert and oriented to person, place and time. Normal motor skills, Displays no atrophy or tremors and exhibits normal muscle tone.  No focal neurological deficits noted cranial nerves II through XII grossly intact. No sensory deficit noted.  Strength at baseline in bilateral upper and lower extremities. Gait normal. Musculoskeletal: No warmth swelling or erythema  around joints, no spinal tenderness noted. Psychiatric: Patient alert and oriented x3, good insight and cognition, good recent to remote recall. Skin: Skin is warm and dry. No bruising, no ecchymosis and no rash noted. Pt is not diaphoretic. No erythema. No pallor    Blood pressure 105/65, pulse 91, temperature 99.1 F (37.3 C), temperature source Oral, resp. rate 17, height 6' (1.829 m), weight 83.6 kg (184 lb 4.9 oz), SpO2 94 %.   Recent Labs  07/26/16 0119 07/28/16 0508  NA 136 135  K 3.3* 4.2  CL 109 109  CO2 20* 20*  GLUCOSE 130* 127*  BUN 9 12  CREATININE 1.23 0.77  CALCIUM 8.9 8.3*    Recent Labs  07/26/16 0119  AST 34  ALT 22  ALKPHOS 88  BILITOT 7.8*  PROT 7.7  ALBUMIN 4.1    Recent Labs  07/26/16 0119  LIPASE 40    Recent Labs  07/26/16 0119 07/27/16 0528  WBC 28.8* 18.7*  NEUTROABS 24.2* 12.9*  HGB 9.5*  8.9*  HCT 28.8* 25.8*  MCV 90.9 88.1  PLT 542* 433*     Total time spent including face to face and decision making was greater than 30 minutes  Signed: Sal Spratley A. 07/28/2016, 2:32 PM

## 2016-07-28 NOTE — Progress Notes (Signed)
Pt discharged to home, family coming to pick up patient. Pt in stable condition.  Pain level stable and unchanged. SRP, RN

## 2016-07-28 NOTE — Progress Notes (Signed)
Pt O2 sats completed as ordered RA rest 84%  RA ambulation 86%  O2 3l  Rest 90-93%  O2 3l ambulation 89-91%.  SRP, RN

## 2016-07-28 NOTE — Progress Notes (Signed)
Pt discharged. Stable condition. SRP, RN

## 2016-07-30 DIAGNOSIS — Z8673 Personal history of transient ischemic attack (TIA), and cerebral infarction without residual deficits: Secondary | ICD-10-CM | POA: Diagnosis not present

## 2016-07-30 DIAGNOSIS — Z86711 Personal history of pulmonary embolism: Secondary | ICD-10-CM | POA: Diagnosis not present

## 2016-07-30 DIAGNOSIS — E559 Vitamin D deficiency, unspecified: Secondary | ICD-10-CM | POA: Diagnosis not present

## 2016-07-30 DIAGNOSIS — R509 Fever, unspecified: Secondary | ICD-10-CM | POA: Diagnosis not present

## 2016-07-30 DIAGNOSIS — R0781 Pleurodynia: Secondary | ICD-10-CM | POA: Diagnosis not present

## 2016-07-30 DIAGNOSIS — D72829 Elevated white blood cell count, unspecified: Secondary | ICD-10-CM | POA: Diagnosis not present

## 2016-07-30 DIAGNOSIS — I50810 Right heart failure, unspecified: Secondary | ICD-10-CM | POA: Diagnosis not present

## 2016-07-30 DIAGNOSIS — J9611 Chronic respiratory failure with hypoxia: Secondary | ICD-10-CM | POA: Diagnosis present

## 2016-07-30 DIAGNOSIS — I517 Cardiomegaly: Secondary | ICD-10-CM | POA: Diagnosis not present

## 2016-07-30 DIAGNOSIS — I2724 Chronic thromboembolic pulmonary hypertension: Secondary | ICD-10-CM | POA: Diagnosis not present

## 2016-07-30 DIAGNOSIS — I272 Pulmonary hypertension, unspecified: Secondary | ICD-10-CM | POA: Diagnosis not present

## 2016-07-30 DIAGNOSIS — D5700 Hb-SS disease with crisis, unspecified: Secondary | ICD-10-CM | POA: Diagnosis present

## 2016-07-30 DIAGNOSIS — Z7901 Long term (current) use of anticoagulants: Secondary | ICD-10-CM | POA: Diagnosis not present

## 2016-07-30 DIAGNOSIS — G8929 Other chronic pain: Secondary | ICD-10-CM | POA: Diagnosis present

## 2016-07-30 DIAGNOSIS — Z7982 Long term (current) use of aspirin: Secondary | ICD-10-CM | POA: Diagnosis not present

## 2016-07-30 DIAGNOSIS — Z87891 Personal history of nicotine dependence: Secondary | ICD-10-CM | POA: Diagnosis not present

## 2016-07-30 DIAGNOSIS — G4700 Insomnia, unspecified: Secondary | ICD-10-CM | POA: Diagnosis not present

## 2016-07-30 DIAGNOSIS — Z9981 Dependence on supplemental oxygen: Secondary | ICD-10-CM | POA: Diagnosis not present

## 2016-07-30 DIAGNOSIS — R079 Chest pain, unspecified: Secondary | ICD-10-CM | POA: Diagnosis not present

## 2016-07-30 DIAGNOSIS — Z79899 Other long term (current) drug therapy: Secondary | ICD-10-CM | POA: Diagnosis not present

## 2016-07-31 DIAGNOSIS — I272 Pulmonary hypertension, unspecified: Secondary | ICD-10-CM | POA: Diagnosis not present

## 2016-07-31 DIAGNOSIS — R079 Chest pain, unspecified: Secondary | ICD-10-CM | POA: Diagnosis not present

## 2016-07-31 DIAGNOSIS — Z87891 Personal history of nicotine dependence: Secondary | ICD-10-CM | POA: Diagnosis not present

## 2016-07-31 DIAGNOSIS — J9611 Chronic respiratory failure with hypoxia: Secondary | ICD-10-CM | POA: Diagnosis not present

## 2016-07-31 DIAGNOSIS — D5700 Hb-SS disease with crisis, unspecified: Secondary | ICD-10-CM | POA: Diagnosis not present

## 2016-07-31 DIAGNOSIS — Z8673 Personal history of transient ischemic attack (TIA), and cerebral infarction without residual deficits: Secondary | ICD-10-CM | POA: Diagnosis not present

## 2016-07-31 DIAGNOSIS — Z9981 Dependence on supplemental oxygen: Secondary | ICD-10-CM | POA: Diagnosis not present

## 2016-07-31 DIAGNOSIS — Z86711 Personal history of pulmonary embolism: Secondary | ICD-10-CM | POA: Diagnosis not present

## 2016-07-31 LAB — CULTURE, BLOOD (ROUTINE X 2)
Culture: NO GROWTH
Culture: NO GROWTH
SPECIAL REQUESTS: ADEQUATE
Special Requests: ADEQUATE

## 2016-08-01 ENCOUNTER — Telehealth: Payer: Self-pay

## 2016-08-01 DIAGNOSIS — Z87891 Personal history of nicotine dependence: Secondary | ICD-10-CM | POA: Diagnosis not present

## 2016-08-01 DIAGNOSIS — D5700 Hb-SS disease with crisis, unspecified: Secondary | ICD-10-CM | POA: Diagnosis not present

## 2016-08-01 DIAGNOSIS — Z8673 Personal history of transient ischemic attack (TIA), and cerebral infarction without residual deficits: Secondary | ICD-10-CM | POA: Diagnosis not present

## 2016-08-01 DIAGNOSIS — Z9981 Dependence on supplemental oxygen: Secondary | ICD-10-CM | POA: Diagnosis not present

## 2016-08-01 DIAGNOSIS — I272 Pulmonary hypertension, unspecified: Secondary | ICD-10-CM | POA: Diagnosis not present

## 2016-08-01 DIAGNOSIS — Z86711 Personal history of pulmonary embolism: Secondary | ICD-10-CM | POA: Diagnosis not present

## 2016-08-01 DIAGNOSIS — R079 Chest pain, unspecified: Secondary | ICD-10-CM | POA: Diagnosis not present

## 2016-08-01 DIAGNOSIS — J9611 Chronic respiratory failure with hypoxia: Secondary | ICD-10-CM | POA: Diagnosis not present

## 2016-08-02 ENCOUNTER — Other Ambulatory Visit: Payer: Self-pay | Admitting: Internal Medicine

## 2016-08-02 DIAGNOSIS — Z87891 Personal history of nicotine dependence: Secondary | ICD-10-CM | POA: Diagnosis not present

## 2016-08-02 DIAGNOSIS — R079 Chest pain, unspecified: Secondary | ICD-10-CM | POA: Diagnosis not present

## 2016-08-02 DIAGNOSIS — D5700 Hb-SS disease with crisis, unspecified: Secondary | ICD-10-CM | POA: Diagnosis not present

## 2016-08-02 DIAGNOSIS — F5101 Primary insomnia: Secondary | ICD-10-CM

## 2016-08-02 DIAGNOSIS — Z8673 Personal history of transient ischemic attack (TIA), and cerebral infarction without residual deficits: Secondary | ICD-10-CM | POA: Diagnosis not present

## 2016-08-02 DIAGNOSIS — J9611 Chronic respiratory failure with hypoxia: Secondary | ICD-10-CM | POA: Diagnosis not present

## 2016-08-02 DIAGNOSIS — Z86711 Personal history of pulmonary embolism: Secondary | ICD-10-CM | POA: Diagnosis not present

## 2016-08-02 DIAGNOSIS — I272 Pulmonary hypertension, unspecified: Secondary | ICD-10-CM | POA: Diagnosis not present

## 2016-08-02 DIAGNOSIS — Z9981 Dependence on supplemental oxygen: Secondary | ICD-10-CM | POA: Diagnosis not present

## 2016-08-02 MED ORDER — HYDROMORPHONE HCL 4 MG PO TABS: 4.0000 mg | ORAL_TABLET | ORAL | 0 refills | Status: AC | PRN

## 2016-08-02 MED ORDER — ZOLPIDEM TARTRATE 10 MG PO TABS: 10.0000 mg | ORAL_TABLET | Freq: Every evening | ORAL | 0 refills | Status: DC | PRN

## 2016-08-02 MED ORDER — MORPHINE SULFATE ER 30 MG PO TBCR: 30.0000 mg | EXTENDED_RELEASE_TABLET | Freq: Two times a day (BID) | ORAL | 0 refills | Status: DC

## 2016-08-03 ENCOUNTER — Encounter (HOSPITAL_COMMUNITY): Payer: Self-pay

## 2016-08-03 ENCOUNTER — Emergency Department (HOSPITAL_COMMUNITY)
Admission: EM | Admit: 2016-08-03 | Discharge: 2016-08-04 | Disposition: A | Discharge status: Home / Self Care | Payer: Medicare Other | Source: Home / Self Care | Attending: Emergency Medicine | Admitting: Emergency Medicine

## 2016-08-03 DIAGNOSIS — I10 Essential (primary) hypertension: Secondary | ICD-10-CM

## 2016-08-03 DIAGNOSIS — Z79899 Other long term (current) drug therapy: Secondary | ICD-10-CM | POA: Insufficient documentation

## 2016-08-03 DIAGNOSIS — Z8673 Personal history of transient ischemic attack (TIA), and cerebral infarction without residual deficits: Secondary | ICD-10-CM | POA: Diagnosis not present

## 2016-08-03 DIAGNOSIS — I2609 Other pulmonary embolism with acute cor pulmonale: Secondary | ICD-10-CM | POA: Diagnosis not present

## 2016-08-03 DIAGNOSIS — R000 Tachycardia, unspecified: Secondary | ICD-10-CM | POA: Diagnosis not present

## 2016-08-03 DIAGNOSIS — R7881 Bacteremia: Secondary | ICD-10-CM | POA: Diagnosis not present

## 2016-08-03 DIAGNOSIS — Z9049 Acquired absence of other specified parts of digestive tract: Secondary | ICD-10-CM | POA: Diagnosis not present

## 2016-08-03 DIAGNOSIS — J9611 Chronic respiratory failure with hypoxia: Secondary | ICD-10-CM | POA: Diagnosis not present

## 2016-08-03 DIAGNOSIS — Z87891 Personal history of nicotine dependence: Secondary | ICD-10-CM

## 2016-08-03 DIAGNOSIS — Z9889 Other specified postprocedural states: Secondary | ICD-10-CM | POA: Diagnosis not present

## 2016-08-03 DIAGNOSIS — I2721 Secondary pulmonary arterial hypertension: Secondary | ICD-10-CM | POA: Diagnosis not present

## 2016-08-03 DIAGNOSIS — Z833 Family history of diabetes mellitus: Secondary | ICD-10-CM | POA: Diagnosis not present

## 2016-08-03 DIAGNOSIS — Z7901 Long term (current) use of anticoagulants: Secondary | ICD-10-CM | POA: Diagnosis not present

## 2016-08-03 DIAGNOSIS — Z96641 Presence of right artificial hip joint: Secondary | ICD-10-CM | POA: Diagnosis not present

## 2016-08-03 DIAGNOSIS — R079 Chest pain, unspecified: Secondary | ICD-10-CM | POA: Diagnosis not present

## 2016-08-03 DIAGNOSIS — Z818 Family history of other mental and behavioral disorders: Secondary | ICD-10-CM | POA: Diagnosis not present

## 2016-08-03 DIAGNOSIS — N179 Acute kidney failure, unspecified: Secondary | ICD-10-CM | POA: Diagnosis not present

## 2016-08-03 DIAGNOSIS — Z86711 Personal history of pulmonary embolism: Secondary | ICD-10-CM | POA: Diagnosis not present

## 2016-08-03 DIAGNOSIS — I330 Acute and subacute infective endocarditis: Secondary | ICD-10-CM | POA: Diagnosis not present

## 2016-08-03 DIAGNOSIS — D5700 Hb-SS disease with crisis, unspecified: Secondary | ICD-10-CM | POA: Insufficient documentation

## 2016-08-03 DIAGNOSIS — Z832 Family history of diseases of the blood and blood-forming organs and certain disorders involving the immune mechanism: Secondary | ICD-10-CM | POA: Diagnosis not present

## 2016-08-03 DIAGNOSIS — T80219A Unspecified infection due to central venous catheter, initial encounter: Secondary | ICD-10-CM | POA: Diagnosis not present

## 2016-08-03 NOTE — ED Triage Notes (Signed)
Pt c/o sickle cell pain to chest and ribs starting this morning when he awoke.  Has taken his MS Contin and dilaudid as Rx'd w/o relief.

## 2016-08-04 ENCOUNTER — Encounter (HOSPITAL_COMMUNITY): Payer: Self-pay | Admitting: Emergency Medicine

## 2016-08-04 ENCOUNTER — Inpatient Hospital Stay (HOSPITAL_COMMUNITY)
Admission: EM | Admit: 2016-08-04 | Discharge: 2016-08-05 | Discharge status: Against Medical Advice | Payer: Medicare Other | Source: Home / Self Care | Attending: Family Medicine | Admitting: Family Medicine

## 2016-08-04 ENCOUNTER — Telehealth (HOSPITAL_COMMUNITY): Payer: Self-pay | Admitting: *Deleted

## 2016-08-04 DIAGNOSIS — Z87891 Personal history of nicotine dependence: Secondary | ICD-10-CM

## 2016-08-04 DIAGNOSIS — G894 Chronic pain syndrome: Secondary | ICD-10-CM | POA: Diagnosis present

## 2016-08-04 DIAGNOSIS — Z8673 Personal history of transient ischemic attack (TIA), and cerebral infarction without residual deficits: Secondary | ICD-10-CM

## 2016-08-04 DIAGNOSIS — I330 Acute and subacute infective endocarditis: Secondary | ICD-10-CM | POA: Diagnosis present

## 2016-08-04 DIAGNOSIS — Z7982 Long term (current) use of aspirin: Secondary | ICD-10-CM

## 2016-08-04 DIAGNOSIS — Z832 Family history of diseases of the blood and blood-forming organs and certain disorders involving the immune mechanism: Secondary | ICD-10-CM

## 2016-08-04 DIAGNOSIS — E876 Hypokalemia: Secondary | ICD-10-CM | POA: Diagnosis present

## 2016-08-04 DIAGNOSIS — Z79899 Other long term (current) drug therapy: Secondary | ICD-10-CM

## 2016-08-04 DIAGNOSIS — N179 Acute kidney failure, unspecified: Secondary | ICD-10-CM | POA: Diagnosis present

## 2016-08-04 DIAGNOSIS — I081 Rheumatic disorders of both mitral and tricuspid valves: Secondary | ICD-10-CM | POA: Diagnosis present

## 2016-08-04 DIAGNOSIS — Z9049 Acquired absence of other specified parts of digestive tract: Secondary | ICD-10-CM

## 2016-08-04 DIAGNOSIS — Z86711 Personal history of pulmonary embolism: Secondary | ICD-10-CM

## 2016-08-04 DIAGNOSIS — I10 Essential (primary) hypertension: Secondary | ICD-10-CM | POA: Diagnosis present

## 2016-08-04 DIAGNOSIS — D5700 Hb-SS disease with crisis, unspecified: Secondary | ICD-10-CM | POA: Diagnosis present

## 2016-08-04 DIAGNOSIS — Z9981 Dependence on supplemental oxygen: Secondary | ICD-10-CM

## 2016-08-04 DIAGNOSIS — Q8901 Asplenia (congenital): Secondary | ICD-10-CM

## 2016-08-04 DIAGNOSIS — Z96641 Presence of right artificial hip joint: Secondary | ICD-10-CM | POA: Diagnosis present

## 2016-08-04 DIAGNOSIS — T80219A Unspecified infection due to central venous catheter, initial encounter: Secondary | ICD-10-CM | POA: Diagnosis not present

## 2016-08-04 DIAGNOSIS — R7881 Bacteremia: Secondary | ICD-10-CM | POA: Diagnosis present

## 2016-08-04 DIAGNOSIS — Z833 Family history of diabetes mellitus: Secondary | ICD-10-CM

## 2016-08-04 DIAGNOSIS — Z9889 Other specified postprocedural states: Secondary | ICD-10-CM

## 2016-08-04 DIAGNOSIS — Z818 Family history of other mental and behavioral disorders: Secondary | ICD-10-CM

## 2016-08-04 DIAGNOSIS — J9611 Chronic respiratory failure with hypoxia: Secondary | ICD-10-CM | POA: Diagnosis present

## 2016-08-04 DIAGNOSIS — I059 Rheumatic mitral valve disease, unspecified: Secondary | ICD-10-CM | POA: Diagnosis present

## 2016-08-04 DIAGNOSIS — I272 Pulmonary hypertension, unspecified: Secondary | ICD-10-CM

## 2016-08-04 DIAGNOSIS — Z7901 Long term (current) use of anticoagulants: Secondary | ICD-10-CM

## 2016-08-04 DIAGNOSIS — I2609 Other pulmonary embolism with acute cor pulmonale: Secondary | ICD-10-CM | POA: Diagnosis present

## 2016-08-04 DIAGNOSIS — Z9114 Patient's other noncompliance with medication regimen: Secondary | ICD-10-CM

## 2016-08-04 DIAGNOSIS — D571 Sickle-cell disease without crisis: Secondary | ICD-10-CM | POA: Diagnosis present

## 2016-08-04 DIAGNOSIS — D638 Anemia in other chronic diseases classified elsewhere: Secondary | ICD-10-CM | POA: Diagnosis present

## 2016-08-04 DIAGNOSIS — B957 Other staphylococcus as the cause of diseases classified elsewhere: Secondary | ICD-10-CM | POA: Diagnosis present

## 2016-08-04 DIAGNOSIS — I2721 Secondary pulmonary arterial hypertension: Secondary | ICD-10-CM | POA: Diagnosis present

## 2016-08-04 DIAGNOSIS — Z86718 Personal history of other venous thrombosis and embolism: Secondary | ICD-10-CM

## 2016-08-04 LAB — RETICULOCYTES
RBC.: 2.64 MIL/uL — ABNORMAL LOW (ref 4.22–5.81)
RBC.: 2.62 MIL/uL — ABNORMAL LOW (ref 4.22–5.81)
RETIC CT PCT: 6.6 % — AB (ref 0.4–3.1)
Retic Count, Absolute: 142.6 10*3/uL (ref 19.0–186.0)
Retic Count, Absolute: 172.9 10*3/uL (ref 19.0–186.0)
Retic Ct Pct: 5.4 % — ABNORMAL HIGH (ref 0.4–3.1)

## 2016-08-04 LAB — CBC WITH DIFFERENTIAL/PLATELET
BASOS ABS: 0.1 10*3/uL (ref 0.0–0.1)
Basophils Absolute: 0.1 10*3/uL (ref 0.0–0.1)
Basophils Relative: 1 %
Basophils Relative: 1 %
EOS ABS: 0.2 10*3/uL (ref 0.0–0.7)
EOS PCT: 1 %
Eosinophils Absolute: 0.2 10*3/uL (ref 0.0–0.7)
Eosinophils Relative: 1 %
HCT: 23.1 % — ABNORMAL LOW (ref 39.0–52.0)
HEMATOCRIT: 22.9 % — AB (ref 39.0–52.0)
HEMOGLOBIN: 7.9 g/dL — AB (ref 13.0–17.0)
Hemoglobin: 8 g/dL — ABNORMAL LOW (ref 13.0–17.0)
LYMPHS ABS: 1.1 10*3/uL (ref 0.7–4.0)
LYMPHS ABS: 1.2 10*3/uL (ref 0.7–4.0)
LYMPHS PCT: 6 %
LYMPHS PCT: 6 %
MCH: 30.3 pg (ref 26.0–34.0)
MCH: 30.2 pg (ref 26.0–34.0)
MCHC: 34.6 g/dL (ref 30.0–36.0)
MCHC: 34.5 g/dL (ref 30.0–36.0)
MCV: 87.5 fL (ref 78.0–100.0)
MCV: 87.4 fL (ref 78.0–100.0)
MONOS PCT: 13 %
Monocytes Absolute: 2.1 10*3/uL — ABNORMAL HIGH (ref 0.1–1.0)
Monocytes Absolute: 2.6 10*3/uL — ABNORMAL HIGH (ref 0.1–1.0)
Monocytes Relative: 11 %
NEUTROS ABS: 16.2 10*3/uL — AB (ref 1.7–7.7)
NEUTROS ABS: 16.5 10*3/uL — AB (ref 1.7–7.7)
NEUTROS PCT: 79 %
Neutrophils Relative %: 81 %
Platelets: 555 10*3/uL — ABNORMAL HIGH (ref 150–400)
Platelets: 603 10*3/uL — ABNORMAL HIGH (ref 150–400)
RBC: 2.64 MIL/uL — AB (ref 4.22–5.81)
RBC: 2.62 MIL/uL — AB (ref 4.22–5.81)
RDW: 18 % — ABNORMAL HIGH (ref 11.5–15.5)
RDW: 17.9 % — ABNORMAL HIGH (ref 11.5–15.5)
WBC: 19.7 10*3/uL — AB (ref 4.0–10.5)
WBC: 20.5 10*3/uL — AB (ref 4.0–10.5)

## 2016-08-04 LAB — COMPREHENSIVE METABOLIC PANEL
ALK PHOS: 94 U/L (ref 38–126)
ALT: 26 U/L (ref 17–63)
ANION GAP: 8 (ref 5–15)
AST: 29 U/L (ref 15–41)
Albumin: 4 g/dL (ref 3.5–5.0)
BUN: 8 mg/dL (ref 6–20)
CALCIUM: 9 mg/dL (ref 8.9–10.3)
CO2: 22 mmol/L (ref 22–32)
Chloride: 108 mmol/L (ref 101–111)
Creatinine, Ser: 0.74 mg/dL (ref 0.61–1.24)
GFR calc Af Amer: >60 mL/min (ref >60)
GFR calc non Af Amer: >60 mL/min (ref >60)
Glucose, Bld: 102 mg/dL — ABNORMAL HIGH (ref 65–99)
POTASSIUM: 3.2 mmol/L — AB (ref 3.5–5.1)
SODIUM: 138 mmol/L (ref 135–145)
Total Bilirubin: 3.9 mg/dL — ABNORMAL HIGH (ref 0.3–1.2)
Total Protein: 8.1 g/dL (ref 6.5–8.1)

## 2016-08-04 LAB — I-STAT CHEM 8, ED
BUN: 5 mg/dL — AB (ref 6–20)
CALCIUM ION: 1.19 mmol/L (ref 1.15–1.40)
CHLORIDE: 105 mmol/L (ref 101–111)
Creatinine, Ser: 0.6 mg/dL — ABNORMAL LOW (ref 0.61–1.24)
GLUCOSE: 108 mg/dL — AB (ref 65–99)
HCT: 25 % — ABNORMAL LOW (ref 39.0–52.0)
Hemoglobin: 8.5 g/dL — ABNORMAL LOW (ref 13.0–17.0)
POTASSIUM: 3.4 mmol/L — AB (ref 3.5–5.1)
Sodium: 139 mmol/L (ref 135–145)
TCO2: 22 mmol/L (ref 0–100)

## 2016-08-04 MED ORDER — HYDROMORPHONE HCL 2 MG/ML IJ SOLN: 2.0000 mg | INTRAMUSCULAR | Status: AC

## 2016-08-04 MED ORDER — HYDROMORPHONE HCL 2 MG/ML IJ SOLN
2.0000 mg | INTRAMUSCULAR | Status: AC
  Administered 2016-08-04: 2 mg via INTRAVENOUS
  Filled 2016-08-04: qty 1

## 2016-08-04 MED ORDER — POTASSIUM CHLORIDE CRYS ER 20 MEQ PO TBCR
40.0000 meq | EXTENDED_RELEASE_TABLET | Freq: Once | ORAL | Status: AC
  Administered 2016-08-04: 40 meq via ORAL
  Filled 2016-08-04: qty 2

## 2016-08-04 MED ORDER — KETOROLAC TROMETHAMINE 30 MG/ML IJ SOLN
30.0000 mg | INTRAMUSCULAR | Status: AC
  Administered 2016-08-04: 30 mg via INTRAVENOUS
  Filled 2016-08-04: qty 1

## 2016-08-04 MED ORDER — HEPARIN SOD (PORK) LOCK FLUSH 100 UNIT/ML IV SOLN
500.0000 [IU] | Freq: Once | INTRAVENOUS | Status: AC
  Administered 2016-08-04: 500 [IU]
  Filled 2016-08-04: qty 5

## 2016-08-04 MED ORDER — KETOROLAC TROMETHAMINE 15 MG/ML IJ SOLN
15.0000 mg | INTRAMUSCULAR | Status: AC
  Administered 2016-08-04: 15 mg via INTRAVENOUS
  Filled 2016-08-04: qty 1

## 2016-08-04 MED ORDER — HYDROMORPHONE HCL 2 MG/ML IJ SOLN
2.0000 mg | INTRAMUSCULAR | Status: DC
  Filled 2016-08-04: qty 1

## 2016-08-04 MED ORDER — DEXTROSE-NACL 5-0.45 % IV SOLN
INTRAVENOUS | Status: DC
  Administered 2016-08-04 (×2): via INTRAVENOUS

## 2016-08-04 MED ORDER — DIPHENHYDRAMINE HCL 50 MG/ML IJ SOLN
25.0000 mg | Freq: Once | INTRAMUSCULAR | Status: AC
  Administered 2016-08-04: 25 mg via INTRAVENOUS
  Filled 2016-08-04: qty 1

## 2016-08-04 MED ORDER — DIPHENHYDRAMINE HCL 25 MG PO CAPS
25.0000 mg | ORAL_CAPSULE | Freq: Once | ORAL | Status: AC
  Administered 2016-08-04: 25 mg via ORAL
  Filled 2016-08-04: qty 1

## 2016-08-04 MED ORDER — HYDROMORPHONE HCL 2 MG/ML IJ SOLN: 2.0000 mg | INTRAMUSCULAR | Status: DC

## 2016-08-04 MED ORDER — DIPHENHYDRAMINE HCL 25 MG PO CAPS
25.0000 mg | ORAL_CAPSULE | ORAL | Status: DC | PRN
  Administered 2016-08-04: 50 mg via ORAL
  Filled 2016-08-04: qty 2

## 2016-08-04 NOTE — ED Notes (Signed)
Bed: WA20 Expected date:  Expected time:  Means of arrival:  Comments: Hold for [facility] positive blood cultures

## 2016-08-04 NOTE — ED Notes (Signed)
Per Randal Buba, MD give pt PO benadryl and plan to discharge.

## 2016-08-04 NOTE — Telephone Encounter (Signed)
Pt called to request to come to the [facility] for treatment. He state his pain is in his sides and back. He states his pain is 9/10. Pt stated that he last took his Dilaudid and MS Contin on Tues, he is out of medicine. He states he went to the ED yesterday. He denies fever, chest pain, abd pain, diarrhea, nausea, vomiting or priapism. Placed patient on hold while speaking with the provider. Pt voiced understanding.  After speaking with the provider, pt was told that he could come in and pick up his prescription. Pt voiced understanding.

## 2016-08-04 NOTE — ED Triage Notes (Signed)
Pt c/o Bilateral side pain, worse today.

## 2016-08-04 NOTE — ED Provider Notes (Signed)
[facility] DEPT Provider Note   CSN: [id number] Arrival date & time: 08/03/16  2126  By signing my name below, I, Margit Banda, attest that this documentation has been prepared under the direction and in the presence of Morrison, Cobin Cadavid, MD. Electronically Signed: Margit Banda, ED Scribe. 08/04/16. 12:57 AM.  History   Chief Complaint Chief Complaint  Patient presents with  . Sickle Cell Pain Crisis    HPI [name] is a 36 y.o. male with a PMHx of sickle cell who presents to the Emergency Department complaining of sickle cell pain to his chest that started the morning of 08/03/16 when he woke up. Pt notes this is typical for him. He is currently out of his medication. No recent travel. Pt denies nausea, vomiting, diarrhea, fever, and knee pain.   The history is provided by the patient. No language interpreter was used.  Sickle Cell Pain Crisis  Severity:  Severe Onset quality:  Gradual Duration:  1 day Similar to previous crisis episodes: yes   Timing:  Constant Progression:  Unchanged Chronicity:  Chronic Sickle cell genotype:  SS Frequency of attacks:  Weekly Context: not alcohol consumption   Relieved by:  Nothing Worsened by:  Nothing Ineffective treatments:  Prescription drugs Associated symptoms: no congestion, no cough, no fever, no nausea, no shortness of breath and no vomiting   Risk factors: no exertion     Past Medical History:  Diagnosis Date  . Acute chest syndrome ([place]) 06/18/2013  . Acute embolism and thrombosis of right internal jugular vein ([place])   . Alcohol consumption of one to four drinks per day   . Avascular necrosis (HCC)    Right Hip  . Blood transfusion   . Chronic anticoagulation   . Demand ischemia ([place]) 01/02/2014  . Former smoker   . Functional asplenia   . Hb-SS disease with crisis ([place])   . History of Clostridium difficile infection   . History of pulmonary embolus (PE)   . Hypertension   . Hypokalemia   . Leukocytosis      Chronic  . Mood disorder ([place])   . Noncompliance with medication regimen   . Oxygen deficiency   . Pulmonary hypertension ([place])   . Second hand tobacco smoke exposure   . Sickle cell anemia (HCC)   . Sickle-cell crisis with associated acute chest syndrome ([place]) 05/13/2013  . Stroke ([place])   . Thrombocytosis (HCC)    Chronic  . Uses marijuana     Patient Active Problem List   Diagnosis Date Noted  . PE (pulmonary thromboembolism) ([place]) 07/26/2016  . Acute pulmonary embolism ([place]) 07/26/2016  . Syncope 07/26/2016  . Acute pain 07/21/2016  . Sickle cell crisis ([place]) 02/23/2016  . H/O arterial ischemic stroke 11/11/2015  . Slurred speech 11/10/2015  . Chronic anemia   . Hb-SS disease without crisis ([place]) 10/07/2014  . Prolonged Q-T interval on ECG 09/10/2014  . Cor pulmonale, chronic ([place]) 08/25/2014  . Sickle cell anemia ([place]) 06/25/2014  . Anemia of chronic disease 06/25/2014  . PAH (pulmonary artery hypertension) ([place]) 03/18/2014  . Chronic respiratory failure with hypoxia ([place]) 03/14/2014  . Paralytic strabismus, external ophthalmoplegia   . Chronic pain syndrome 12/12/2013  . Chronic anticoagulation 08/22/2013  . Essential hypertension 08/22/2013  . Pulmonary HTN ([place]) 06/18/2013  . Functional asplenia   . Vitamin D deficiency 02/13/2013  . Hx of pulmonary embolus 06/29/2012  . 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 Avascular necrosis [facility])     Past Surgical  History:  Procedure Laterality Date  . CHOLECYSTECTOMY     01/2008  . Excision of left periauricular cyst     10/2009  . Excision of right ear lobe cyst with primary closur     11/2007  . Porta cath placement    . Porta cath removal    . PORTACATH PLACEMENT  01/05/2012   Procedure: INSERTION PORT-A-CATH;  Surgeon: Odis Hollingshead, MD;  Location: [facility];  Service: General;  Laterality: N/A;  ultrasound guiced port a cath insertion with fluoroscopy  . Right hip replacement     08/2006  . UMBILICAL HERNIA  REPAIR     01/2008       Home Medications    Prior to Admission medications   Medication Sig Start Date End Date Taking? Authorizing Provider  carvedilol (COREG) 3.125 MG tablet Take 1 tablet (3.125 mg total) by mouth 2 (two) times daily with a meal. 05/17/16  Yes [name], MD  cholecalciferol (VITAMIN D) 1000 units tablet Take 2,000 Units by mouth daily.   Yes [provider]  folic acid (FOLVITE) 1 MG tablet Take 2 tablets (2 mg total) by mouth daily. 04/25/16  Yes [name], MD  gabapentin (NEURONTIN) 300 MG capsule Take 1 capsule (300 mg total) by mouth 3 (three) times daily. 06/01/15  Yes Tresa Garter, MD  HYDROmorphone (DILAUDID) 4 MG tablet Take 1 tablet (4 mg total) by mouth every 4 (four) hours as needed for moderate pain or severe pain. 08/02/16 08/17/16 Yes Jegede, Marlena Clipper, MD  hydroxyurea (HYDREA) 500 MG capsule Take 500 mg by mouth every other day. May take with food to minimize GI side effects. 06/25/16  Yes [provider]  hydroxyurea (HYDREA) 500 MG capsule Take 1,000 mg by mouth every other day. May take with food to minimize GI side effects. 06/24/16  Yes [provider]  L-glutamine (ENDARI) 5 g PACK Powder Packet Take 15 g by mouth 2 (two) times daily. 04/25/16  Yes [name], MD  lisinopril (PRINIVIL,ZESTRIL) 2.5 MG tablet Take 2.5 mg by mouth at bedtime.  05/22/16  Yes [provider]  morphine (MS CONTIN) 30 MG 12 hr tablet Take 1 tablet (30 mg total) by mouth every 12 (twelve) hours. 08/02/16 09/01/16 Yes Tresa Garter, MD  potassium chloride SA (K-DUR,KLOR-CON) 20 MEQ tablet Take 2 tablets (40 mEq total) by mouth daily. 04/27/16  Yes [name], MD  Rivaroxaban 15 & 20 MG TBPK Take as directed on package: Start with one 15mg  tablet by mouth twice a day with food. On Day 22, switch to one 20mg  tablet once a day with food. 07/28/16  Yes [name], MD  torsemide (DEMADEX) 20 MG  tablet Take 1 tablet (20 mg total) by mouth daily as needed (leg swelling). 06/13/16  Yes [name], MD  zolpidem (AMBIEN) 10 MG tablet Take 1 tablet (10 mg total) by mouth at bedtime as needed for sleep. 08/04/16 09/03/16 Yes Tresa Garter, MD    Family History Family History  Problem Relation Age of Onset  . Sickle cell trait Mother   . Depression Mother   . Diabetes Mother   . Sickle cell trait Father   . Sickle cell trait Brother     Social History Social History  Substance Use Topics  . Smoking status: Former Smoker    Packs/day: 0.50    Years: 10.00    Types: Cigarettes    Quit date: 05/29/2011  .  Smokeless tobacco: Never Used  . Alcohol use No     Allergies   Patient has no known allergies.   Review of Systems Review of Systems  Constitutional: Negative for fever.  HENT: Negative for congestion.   Respiratory: Negative for cough and shortness of breath.   Gastrointestinal: Negative for diarrhea, nausea and vomiting.  Genitourinary: Negative for flank pain.  Musculoskeletal: Negative for arthralgias.  All other systems reviewed and are negative.    Physical Exam Updated Vital Signs BP 113/81 (BP Location: Right Arm)   Pulse 85   Temp 98.4 F (36.9 C) (Oral)   Resp 18   Ht 6' (1.829 m)   Wt 192 lb (87.1 kg)   SpO2 97%   BMI 26.04 kg/m   Physical Exam  Constitutional: He appears well-developed and well-nourished.  HENT:  Head: Normocephalic.  Mouth/Throat: Oropharynx is clear and moist. No oropharyngeal exudate.  Eyes: Conjunctivae and EOM are normal. Pupils are equal, round, and reactive to light. Right eye exhibits no discharge. Left eye exhibits no discharge. No scleral icterus.  Neck: Normal range of motion. Neck supple. No JVD present. No tracheal deviation present.  Trachea is midline. No stridor or carotid bruits.  Cardiovascular: Normal rate, regular rhythm, normal heart sounds and intact distal pulses.   No murmur  heard. Pulmonary/Chest: Effort normal and breath sounds normal. No stridor. No respiratory distress. He has no wheezes. He has no rales.  Lungs CTA bilaterally.  Abdominal: Soft. Bowel sounds are normal. He exhibits no distension. There is no tenderness. There is no rebound and no guarding.  Musculoskeletal: Normal range of motion. He exhibits no edema or tenderness.  All compartments are soft. No palpable cords.   Lymphadenopathy:    He has no cervical adenopathy.  Neurological: He is alert. He has normal reflexes. He displays normal reflexes.  Skin: Skin is warm and dry. Capillary refill takes less than 2 seconds.  Psychiatric: He has a normal mood and affect. His behavior is normal.  Nursing note and vitals reviewed.    ED Treatments / Results  DIAGNOSTIC STUDIES: Oxygen Saturation is 97% on RA, normal by my interpretation.   COORDINATION OF CARE: 12:57 AM-Discussed next steps with pt. Pt verbalized understanding and is agreeable with the plan.   Labs (all labs ordered are listed, but only abnormal results are displayed)  Results for orders placed or performed during the hospital encounter of 08/03/16  CBC with Differential/Platelet  Result Value Ref Range   WBC 19.7 (H) 4.0 - 10.5 K/uL   RBC 2.64 (L) 4.22 - 5.81 MIL/uL   Hemoglobin 8.0 (L) 13.0 - 17.0 g/dL   HCT 23.1 (L) 39.0 - 52.0 %   MCV 87.5 78.0 - 100.0 fL   MCH 30.3 26.0 - 34.0 pg   MCHC 34.6 30.0 - 36.0 g/dL   RDW 18.0 (H) 11.5 - 15.5 %   Platelets 555 (H) 150 - 400 K/uL   Neutrophils Relative % 81 %   Neutro Abs 16.2 (H) 1.7 - 7.7 K/uL   Lymphocytes Relative 6 %   Lymphs Abs 1.1 0.7 - 4.0 K/uL   Monocytes Relative 11 %   Monocytes Absolute 2.1 (H) 0.1 - 1.0 K/uL   Eosinophils Relative 1 %   Eosinophils Absolute 0.2 0.0 - 0.7 K/uL   Basophils Relative 1 %   Basophils Absolute 0.1 0.0 - 0.1 K/uL  Reticulocytes  Result Value Ref Range   Retic Ct Pct 5.4 (H) 0.4 - 3.1 %  RBC. 2.64 (L) 4.22 - 5.81 MIL/uL    Retic Count, Absolute 142.6 19.0 - 186.0 K/uL  I-Stat Chem 8, ED  Result Value Ref Range   Sodium 139 135 - 145 mmol/L   Potassium 3.4 (L) 3.5 - 5.1 mmol/L   Chloride 105 101 - 111 mmol/L   BUN 5 (L) 6 - 20 mg/dL   Creatinine, Ser 0.60 (L) 0.61 - 1.24 mg/dL   Glucose, Bld 108 (H) 65 - 99 mg/dL   Calcium, Ion 1.19 1.15 - 1.40 mmol/L   TCO2 22 0 - 100 mmol/L   Hemoglobin 8.5 (L) 13.0 - 17.0 g/dL   HCT 25.0 (L) 39.0 - 52.0 %   *Note: Due to a large number of results and/or encounters for the requested time period, some results have not been displayed. A complete set of results can be found in Results Review.   Dg Chest 2 View  Result Date: 07/20/2016 CLINICAL DATA:  36 year old male with sickle cell disease. Bilateral rib and leg pain. EXAM: CHEST  2 VIEW COMPARISON:  Chest CTA 07/18/2016 and earlier. FINDINGS: Stable cardiomegaly and mediastinal contours. Stable left subclavian Port-A-Cath, accessed. Stable to mildly improved lung volumes. No pneumothorax. Stable pulmonary vascularity without acute edema. No pleural effusion or confluent pulmonary opacity. Stable cholecystectomy clips. Negative visible bowel gas pattern. Stable visualized osseous structures, osteopenia and also H-type vertebral body changes better demonstrated on the recent CTA. IMPRESSION: Stable cardiomegaly. No acute cardiopulmonary abnormality. Electronically Signed   By: Genevie Ann M.D.   On: 07/20/2016 16:47   Dg Chest 2 View  Result Date: 07/18/2016 CLINICAL DATA:  Bilateral chest and back pain, somnolence. Former smoker. History of sickle cell anemia, pulmonary embolus. EXAM: CHEST  2 VIEW COMPARISON:  Portable chest x-ray of July 14, 2016 FINDINGS: The lungs are well-expanded. There is no focal infiltrate. No significant pleural effusion is visible. The cardiac silhouette remains enlarged. The pulmonary vascularity remains engorged. The pulmonary interstitial markings are mildly increased over baseline. The power port  catheter tip projects over the cavoatrial junction. The mediastinum is normal in width. The bony thorax exhibits no acute abnormality. IMPRESSION: COPD and smoking related changes, stable. Mild pulmonary interstitial prominence bilaterally suggests low-grade CHF. Stable cardiomegaly. Electronically Signed   By: David  Martinique M.D.   On: 07/18/2016 13:05   Ct Angio Chest Pe W And/or Wo Contrast  Result Date: 07/26/2016 CLINICAL DATA:  36 y/o M; rib pain, abdominal pain, jaundice, history of sickle cell, and history of asplenia. EXAM: CT ANGIOGRAPHY CHEST WITH CONTRAST TECHNIQUE: Multidetector CT imaging of the chest was performed using the standard protocol during bolus administration of intravenous contrast. Multiplanar CT image reconstructions and MIPs were obtained to evaluate the vascular anatomy. CONTRAST:  100 cc Isovue 370 COMPARISON:  Concurrent CT angiogram of the abdomen and pelvis. 07/18/2016 CT angiogram of the chest. FINDINGS: Cardiovascular: Enlarged main pulmonary artery measuring 4.2 cm. Normal caliber thoracic aorta. Stable severe cardiomegaly. Left port catheter tip projects within the right atrium. No pericardial effusion. Multiple segmental acute pulmonary emboli within the lungs bilaterally best appreciated in the right lower lobe (series 7, image 151). Mediastinum/Nodes: No enlarged mediastinal, hilar, or axillary lymph nodes. Thyroid gland, trachea, and esophagus demonstrate no significant findings. Lungs/Pleura: Stable 6 mm nodule in the right middle lobe. Septal thickening of the lung bases and right perihilar ground-glass opacities probably represents mild pulmonary edema. No pleural effusion. Upper Abdomen: Calcified atrophic spleen. Musculoskeletal: H shaped vertebral body compatible with bony stigmata of  sickle cell disease. Review of the MIP images confirms the above findings. IMPRESSION: 1. Multiple segmental acute pulmonary emboli. 2. Stable severe cardiomegaly. 3. Enlarged main  pulmonary artery indicates pulmonary artery hypertension. 4. Mild pulmonary edema. 5. Calcified atrophic spleen. These results were called by telephone at the time of interpretation on 07/26/2016 at 3:34 am to Dr. Abigail Butts , who verbally acknowledged these results. Electronically Signed   By: Kristine Garbe M.D.   On: 07/26/2016 03:36   Ct Angio Chest Pe W Or Wo Contrast  Addendum Date: 07/18/2016   ADDENDUM REPORT: 07/18/2016 19:21 ADDENDUM: Add to IMPRESSION: Suspect a degree of underlying pulmonary arterial hypertension given prominence in the main pulmonary outflow tract. Electronically Signed   By: Lowella Grip III M.D.   On: 07/18/2016 19:21   Result Date: 07/18/2016 CLINICAL DATA:  Chest pain.  History of sickle cell disease EXAM: CT ANGIOGRAPHY CHEST WITH CONTRAST TECHNIQUE: Multidetector CT imaging of the chest was performed using the standard protocol during bolus administration of intravenous contrast. Multiplanar CT image reconstructions and MIPs were obtained to evaluate the vascular anatomy. CONTRAST:  90 mL Isovue 370 nonionic COMPARISON:  Chest radiograph July 18, 2016; chest CT Greenleigh Kauth 6, 2018 FINDINGS: Cardiovascular: There is a pulmonary embolus in a posterior segment right lower lobe pulmonary artery branch, similar to prior study. No more central pulmonary embolus evident. There is not felt to be of right heart strain. Heart is diffusely enlarged. There is a small pericardial effusion. There is no thoracic aortic aneurysm or dissection. Port-A-Cath tip is at the cavoatrial junction. Main pulmonary outflow tract measures 3.8 cm in diameter consistent with pulmonary arterial hypertension. Mediastinum/Nodes: Visualized thyroid appears normal. There are multiple mediastinal lymph nodes which appear similar to the previous study. There is not felt to be frank adenopathy by size criteria. Lungs/Pleura: There is bibasilar atelectasis. There is interstitial pulmonary edema.  There is a stable nodular opacity in the right middle lobe seen on axial slice 51 series 11 measuring 3 mm. There is a stable nodular opacity in the lateral segment of the right middle lobe measuring 6 x 6 mm, ground-glass in appearance, best seen on axial slice 51 series 11. There is no appreciable pleural effusion or pleural thickening. Upper Abdomen: Spleen is small and calcified consistent with prior infarct from sickle cell disease. There is reflux of contrast into the inferior vena cava and hepatic veins. Visualized upper abdominal structures otherwise appear unremarkable. Musculoskeletal: There are endplate infarcts and multiple thoracic spine levels. Multiple areas of sclerosis noted consistent with sickle cell disease. No appreciable extramedullary hematopoiesis noted. Review of the MIP images confirms the above findings. IMPRESSION: Small pulmonary embolus in a right lower lobe pulmonary artery branch, also present on most recent study. Suspect small chronic pulmonary embolus focus. No new pulmonary embolus compared to most recent study. Findings felt to be indicative of a degree of congestive heart failure. Reflux of contrast into the hepatic veins and inferior vena cava may indicate increase in right heart pressure. Nodular opacities, largest measuring 6 x 6 mm, ground-glass in appearance. This nodular opacity noted in right middle lobe. Initial follow-up with CT at 6-12 months is recommended to confirm persistence. If persistent, repeat CT is recommended every 2 years until 5 years of stability has been established. This recommendation follows the consensus statement: Guidelines for Management of Incidental Pulmonary Nodules Detected on CT Images: From the Fleischner Society 2017; Radiology 2017; 284:228-243. Multiple small lymph nodes, stable. Bony changes and splenic  appearance consistent with known sickle cell disease. Critical Value/emergent results were called by telephone at the time of  interpretation on 07/18/2016 at 4:18 pm to Dr. Jola Schmidt , who verbally acknowledged these results. Electronically Signed: By: Lowella Grip III M.D. On: 07/18/2016 16:19   Dg Chest Port 1 View  Result Date: 07/26/2016 CLINICAL DATA:  Awakened with chest pain. History of sickle cell disease. EXAM: PORTABLE CHEST 1 VIEW COMPARISON:  07/20/2016 FINDINGS: Unchanged prominent cardiac silhouette. The lungs are clear. No pleural effusions. No pneumothorax. Hilar and mediastinal contours are unremarkable and unchanged. Left-sided port appears intact, extending into the low SVC. IMPRESSION: Enlarged cardiac silhouette without significant interval change. No consolidation or effusion. Electronically Signed   By: Andreas Newport M.D.   On: 07/26/2016 01:52   Dg Chest Port 1 View  Result Date: 07/14/2016 CLINICAL DATA:  36 year old male with history of sickle cell disease presenting with bilateral chest pain, body pain and shortness of breath today. EXAM: PORTABLE CHEST 1 VIEW COMPARISON:  Chest x-ray 07/03/2016. FINDINGS: Mild blunting of the left costophrenic sulcus and obscuration of the lateral left hemidiaphragm suggesting a small left pleural effusion. No right pleural effusion. No definite consolidative airspace disease. Scattered areas of linear scarring in the lung bases, similar to prior studies. Cephalization of the pulmonary vasculature, without frank pulmonary edema. No pneumothorax. Heart size is mildly enlarged. Upper mediastinal contours are within normal limits. Left-sided subclavian single-lumen power porta cath with tip terminating at the superior cavoatrial junction. IMPRESSION: 1. New small left pleural effusion. 2. Chronic scarring in the lung bases bilaterally. 3. Cardiomegaly with pulmonary venous congestion, but no frank pulmonary edema. Electronically Signed   By: Vinnie Langton M.D.   On: 07/14/2016 08:07   Ct Angio Abdomen Pelvis  W &/or Wo Contrast  Result Date:  07/26/2016 CLINICAL DATA:  Chest wall pain and abdominal pain. Sickle cell disease. EXAM: CTA ABDOMEN AND PELVIS wITHOUT AND WITH CONTRAST TECHNIQUE: Multidetector CT imaging of the abdomen and pelvis was performed using the standard protocol during bolus administration of intravenous contrast. Multiplanar reconstructed images and MIPs were obtained and reviewed to evaluate the vascular anatomy. CONTRAST:  150 mL Isovue 370 intravenous COMPARISON:  12/15/2015 FINDINGS: VASCULAR Aorta: Normal caliber with mild atherosclerotic calcification. No dissection or aneurysm. Celiac: Patent without evidence of aneurysm, dissection, vasculitis or significant stenosis. Small calcified spleen. Splenic artery is diminutive, possibly occluded. SMA: Patent without evidence of aneurysm, dissection, vasculitis or significant stenosis. Renals: Both renal arteries are patent without evidence of aneurysm, dissection, vasculitis, fibromuscular dysplasia or significant stenosis. IMA: Patent without evidence of aneurysm, dissection, vasculitis or significant stenosis. Inflow: Normal caliber and intact. Mild atherosclerotic calcification. Proximal Outflow: Bilateral common femoral and visualized portions of the superficial and profunda femoral arteries are patent without evidence of aneurysm, dissection, vasculitis or significant stenosis. Veins: No obvious venous abnormality within the limitations of this arterial phase study. Review of the MIP images confirms the above findings. NON-VASCULAR Hepatobiliary: Cholecystectomy.  No focal liver lesion. Pancreas: Unremarkable. No pancreatic ductal dilatation or surrounding inflammatory changes. Spleen: Small calcified spleen, typical of sickle cell disease. Adrenals/Urinary Tract: Both adrenals are normal. Scattered renal parenchymal scars. Collecting systems and ureters are unremarkable. Urinary bladder is unremarkable. Stomach/Bowel: Stomach is within normal limits. Appendix appears normal. No  evidence of bowel wall thickening, distention, or inflammatory changes. Lymphatic: No adenopathy in the abdomen or pelvis. Reproductive: Unremarkable Other: Umbilical hernia containing unobstructed small bowel. Musculoskeletal: Scattered bony sclerosis typical of sickle cell  disease. No significant skeletal lesion. IMPRESSION: VASCULAR 1. Aortic atherosclerosis. 2. No aneurysm or dissection. Major branches of the aorta are also patent and normal in caliber. NON-VASCULAR 1. Small calcified spleen, typical of sickle cell disease. 2. Unobstructed small bowel within an umbilical hernia. 3. No acute findings are evident in the abdomen or pelvis. Electronically Signed   By: Andreas Newport M.D.   On: 07/26/2016 04:00   Radiology No results found.  Procedures Procedures (including critical care time)  Medications Ordered in ED Medications  ketorolac (TORADOL) 15 MG/ML injection 15 mg (not administered)  HYDROmorphone (DILAUDID) injection 2 mg (not administered)    Or  HYDROmorphone (DILAUDID) injection 2 mg (not administered)  HYDROmorphone (DILAUDID) injection 2 mg (not administered)    Or  HYDROmorphone (DILAUDID) injection 2 mg (not administered)  HYDROmorphone (DILAUDID) injection 2 mg (not administered)    Or  HYDROmorphone (DILAUDID) injection 2 mg (not administered)  HYDROmorphone (DILAUDID) injection 2 mg (not administered)    Or  HYDROmorphone (DILAUDID) injection 2 mg (not administered)  diphenhydrAMINE (BENADRYL) injection 25 mg (not administered)  dextrose 5 %-0.45 % sodium chloride infusion (not administered)    Does not want admission.  Sleeping post medication.    Final Clinical Impressions(s) / ED Diagnoses  Return for shortness of breath, worsening pain, chest pain with exertion, shortness of breath with exertion, fevers, altered level of consciousness,bleeding or any concerns. Continue benadryl and zantac and will add steroids. Follow up with your own doctor for  recheck.  The patient is nontoxic-appearing on exam and vital signs are within normal limits.   I have reviewed the triage vital signs and the nursing notes. Pertinent labs &imaging results that were available during my care of the patient were reviewed by me and considered in my medical decision making (see chart for details).  After history, exam, and medical workup I feel the patient has been appropriately medically screened and is safe for discharge home. Pertinent diagnoses were discussed with the patient. Patient was given return precautions.    I personally performed the services described in this documentation, which was scribed in my presence. The recorded information has been reviewed and is accurate.     Aquil Duhe, MD 08/04/16 [phone]

## 2016-08-04 NOTE — ED Provider Notes (Signed)
[facility] DEPT Provider Note   CSN: [id number] Arrival date & time: 08/04/16  1851     History   Chief Complaint Chief Complaint  Patient presents with  . Sickle Cell Pain Crisis    HPI [name] is a 36 y.o. male.  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 This particular crisis has been going for the last 3 or 4 days. Was seen here earlier last night/this morning. States that he has run out of his medicine and picked up the prescription today but would not be able to pick it up until tomorrow. He denies fevers. He was recently admitted to [facility] at that time he thinks he had a low-grade fever in addition to a sickle cell crisis. On 6/30 he had 1/2 blood cultures came positive for coagulase-negative staph consistent with staph epidermis. On 7/3 a repeat blood culture was obtained. [facility] called this hospital upon seeing he is a patient here because the blood culture came back positive for gram-positive cocci in clusters. Patient denies any current or recent fevers. His sickle cell pain is in the typical location of bilateral flanks and low back. No weakness or numbness. No cough or chest pain.  Past Medical History:  Diagnosis Date  . Acute chest syndrome ([place]) 06/18/2013  . Acute embolism and thrombosis of right internal jugular vein ([place])   . Alcohol consumption of one to four drinks per day   . Avascular necrosis (HCC)    Right Hip  . Blood transfusion   . Chronic anticoagulation   . Demand ischemia ([place]) 01/02/2014  . Former smoker   . Functional asplenia   . Hb-SS disease with crisis ([place])   . History of Clostridium difficile infection   . History of pulmonary embolus (PE)   . Hypertension   . Hypokalemia   . Leukocytosis    Chronic  . Mood disorder ([place])   . Noncompliance with medication regimen   . Oxygen deficiency   . Pulmonary hypertension ([place])   . Second hand tobacco smoke exposure   .  Sickle cell anemia (HCC)   . Sickle-cell crisis with associated acute chest syndrome ([place]) 05/13/2013  . Stroke ([place])   . Thrombocytosis (HCC)    Chronic  . Uses marijuana     Patient Active Problem List   Diagnosis Date Noted  . Positive blood culture 08/05/2016  . Sickle cell pain crisis ([place]) 08/05/2016  . PE (pulmonary thromboembolism) ([place]) 07/26/2016  . Syncope 07/26/2016  . Acute pain 07/21/2016  . Sickle cell crisis ([place]) 02/23/2016  . H/O arterial ischemic stroke 11/11/2015  . Slurred speech 11/10/2015  . Chronic anemia   . Hb-SS disease without crisis ([place]) 10/07/2014  . Prolonged Q-T interval on ECG 09/10/2014  . Cor pulmonale, chronic ([place]) 08/25/2014  . Sickle cell anemia ([place]) 06/25/2014  . Anemia of chronic disease 06/25/2014  . PAH (pulmonary artery hypertension) ([place]) 03/18/2014  . Chronic respiratory failure with hypoxia ([place]) 03/14/2014  . Paralytic strabismus, external ophthalmoplegia   . Chronic pain syndrome 12/12/2013  . Chronic anticoagulation 08/22/2013  . Essential hypertension 08/22/2013  . Pulmonary HTN ([place]) 06/18/2013  . Functional asplenia   . Vitamin D deficiency 02/13/2013  . Hx of pulmonary embolus 06/29/2012  . 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 Avascular necrosis [facility])     Past Surgical History:  Procedure Laterality Date  . CHOLECYSTECTOMY     01/2008  . Excision  of left periauricular cyst     10/2009  . Excision of right ear lobe cyst with primary closur     11/2007  . Porta cath placement    . Porta cath removal    . PORTACATH PLACEMENT  01/05/2012   Procedure: INSERTION PORT-A-CATH;  Surgeon: Odis Hollingshead, MD;  Location: [facility];  Service: General;  Laterality: N/A;  ultrasound guiced port a cath insertion with fluoroscopy  . Right hip replacement     08/2006  . UMBILICAL HERNIA REPAIR     01/2008       Home Medications    Prior to Admission medications   Medication Sig Start Date End Date Taking? Authorizing  Provider  carvedilol (COREG) 3.125 MG tablet Take 1 tablet (3.125 mg total) by mouth 2 (two) times daily with a meal. 05/17/16  Yes [name], MD  cholecalciferol (VITAMIN D) 1000 units tablet Take 2,000 Units by mouth daily.   Yes [provider]  folic acid (FOLVITE) 1 MG tablet Take 2 tablets (2 mg total) by mouth daily. 04/25/16  Yes [name], MD  gabapentin (NEURONTIN) 300 MG capsule Take 1 capsule (300 mg total) by mouth 3 (three) times daily. 06/01/15  Yes Tresa Garter, MD  HYDROmorphone (DILAUDID) 4 MG tablet Take 1 tablet (4 mg total) by mouth every 4 (four) hours as needed for moderate pain or severe pain. 08/02/16 08/17/16 Yes Jegede, Marlena Clipper, MD  hydroxyurea (HYDREA) 500 MG capsule Take 500 mg by mouth every other day. May take with food to minimize GI side effects. 06/25/16  Yes [provider]  hydroxyurea (HYDREA) 500 MG capsule Take 1,000 mg by mouth every other day. May take with food to minimize GI side effects. 06/24/16  Yes [provider]  L-glutamine (ENDARI) 5 g PACK Powder Packet Take 15 g by mouth 2 (two) times daily. 04/25/16  Yes [name], MD  lisinopril (PRINIVIL,ZESTRIL) 2.5 MG tablet Take 2.5 mg by mouth at bedtime.  05/22/16  Yes [provider]  morphine (MS CONTIN) 30 MG 12 hr tablet Take 1 tablet (30 mg total) by mouth every 12 (twelve) hours. 08/02/16 09/01/16 Yes Tresa Garter, MD  potassium chloride SA (K-DUR,KLOR-CON) 20 MEQ tablet Take 2 tablets (40 mEq total) by mouth daily. 04/27/16  Yes [name], MD  Rivaroxaban 15 & 20 MG TBPK Take as directed on package: Start with one 15mg  tablet by mouth twice a day with food. On Day 22, switch to one 20mg  tablet once a day with food. 07/28/16  Yes [name], MD  torsemide (DEMADEX) 20 MG tablet Take 1 tablet (20 mg total) by mouth daily as needed (leg swelling). 06/13/16   [name], MD  zolpidem (AMBIEN) 10 MG tablet  Take 1 tablet (10 mg total) by mouth at bedtime as needed for sleep. 08/04/16 09/03/16  Tresa Garter, MD    Family History Family History  Problem Relation Age of Onset  . Sickle cell trait Mother   . Depression Mother   . Diabetes Mother   . Sickle cell trait Father   . Sickle cell trait Brother     Social History Social History  Substance Use Topics  . Smoking status: Former Smoker    Packs/day: 0.50    Years: 10.00    Types: Cigarettes    Quit date: 05/29/2011  . Smokeless tobacco: Never Used  . Alcohol use No     Allergies  Patient has no known allergies.   Review of Systems Review of Systems  Constitutional: Negative for fever.  Respiratory: Negative for cough and shortness of breath.   Cardiovascular: Negative for chest pain.  Gastrointestinal: Negative for abdominal pain and vomiting.  Genitourinary: Positive for flank pain.  Musculoskeletal: Positive for back pain.  All other systems reviewed and are negative.    Physical Exam Updated Vital Signs BP 112/69   Pulse 97   Temp 98.5 F (36.9 C)   Resp 18   SpO2 94%   Physical Exam  Constitutional: He is oriented to person, place, and time. He appears well-developed and well-nourished. No distress.  HENT:  Head: Normocephalic and atraumatic.  Right Ear: External ear normal.  Left Ear: External ear normal.  Nose: Nose normal.  Eyes: Right eye exhibits no discharge. Left eye exhibits no discharge.  Neck: Neck supple.  Cardiovascular: Normal rate, regular rhythm and normal heart sounds.   Pulmonary/Chest: Effort normal and breath sounds normal.  Abdominal: Soft. He exhibits no distension. There is no tenderness.  Musculoskeletal: He exhibits no edema.  No thoracic/lumbar tenderness  Neurological: He is alert and oriented to person, place, and time.  Skin: Skin is warm and dry. He is not diaphoretic.  Nursing note and vitals reviewed.    ED Treatments / Results  Labs (all labs ordered are  listed, but only abnormal results are displayed) Labs Reviewed  CBC WITH DIFFERENTIAL/PLATELET - Abnormal; Notable for the following:       Result Value   WBC 20.5 (*)    RBC 2.62 (*)    Hemoglobin 7.9 (*)    HCT 22.9 (*)    RDW 17.9 (*)    Platelets 603 (*)    Neutro Abs 16.5 (*)    Monocytes Absolute 2.6 (*)    All other components within normal limits  RETICULOCYTES - Abnormal; Notable for the following:    Retic Ct Pct 6.6 (*)    RBC. 2.62 (*)    All other components within normal limits  COMPREHENSIVE METABOLIC PANEL - Abnormal; Notable for the following:    Potassium 3.2 (*)    Glucose, Bld 102 (*)    Total Bilirubin 3.9 (*)    All other components within normal limits  CULTURE, BLOOD (ROUTINE X 2)  CULTURE, BLOOD (ROUTINE X 2)    EKG  EKG Interpretation None       Radiology No results found.  Procedures Procedures (including critical care time)  Medications Ordered in ED Medications  HYDROmorphone (DILAUDID) injection 2 mg (2 mg Intravenous Given 08/04/16 2249)    Or  HYDROmorphone (DILAUDID) injection 2 mg ( Subcutaneous See Alternative 08/04/16 2249)  diphenhydrAMINE (BENADRYL) capsule 25-50 mg (50 mg Oral Given 08/04/16 1951)  ketorolac (TORADOL) 30 MG/ML injection 30 mg (30 mg Intravenous Given 08/04/16 2007)  HYDROmorphone (DILAUDID) injection 2 mg (2 mg Intravenous Given 08/04/16 2008)    Or  HYDROmorphone (DILAUDID) injection 2 mg ( Subcutaneous See Alternative 08/04/16 2008)  HYDROmorphone (DILAUDID) injection 2 mg (2 mg Intravenous Given 08/04/16 2107)    Or  HYDROmorphone (DILAUDID) injection 2 mg ( Subcutaneous See Alternative 08/04/16 2107)  HYDROmorphone (DILAUDID) injection 2 mg (2 mg Intravenous Given 08/04/16 2147)    Or  HYDROmorphone (DILAUDID) injection 2 mg ( Subcutaneous See Alternative 08/04/16 2147)  potassium chloride SA (K-DUR,KLOR-CON) CR tablet 40 mEq (40 mEq Oral Given 08/04/16 2147)  heparin lock flush 100 unit/mL (500 Units Intracatheter Given  08/05/16 0108)  Initial Impression / Assessment and Plan / ED Course  I have reviewed the triage vital signs and the nursing notes.  Pertinent labs & imaging results that were available during my care of the patient were reviewed by me and considered in my medical decision making (see chart for details).     Pain has improved, now a 6/10. He does not want to stay. I discussed the positive blood culture from [facility], and while this could be contaminant, it is too early to tell. Is more concerning given frequent hospital trips and port. Agreed to admission but then declined after talking to hospitalist. I reevaluated patient and he appears clear-minded and appears capable of medical decision making. He understands risks if he is truly bacteremic, including death. Still wants to be discharged. Advised f/u with PCP tomorrow (7/6). Strict return precautions, is welcome back any time.  Final Clinical Impressions(s) / ED Diagnoses   Final diagnoses:  Sickle cell pain crisis ([place])  Positive blood culture    New Prescriptions New Prescriptions   No medications on file     Sherwood Gambler, MD 08/05/16 0128

## 2016-08-05 ENCOUNTER — Encounter (HOSPITAL_COMMUNITY): Payer: Self-pay | Admitting: Emergency Medicine

## 2016-08-05 ENCOUNTER — Emergency Department (HOSPITAL_COMMUNITY): Payer: Medicare Other

## 2016-08-05 ENCOUNTER — Encounter (HOSPITAL_COMMUNITY): Payer: Self-pay | Admitting: Family Medicine

## 2016-08-05 ENCOUNTER — Inpatient Hospital Stay (HOSPITAL_COMMUNITY)
Admission: EM | Admit: 2016-08-05 | Discharge: 2016-08-14 | DRG: 314 | Disposition: A | Discharge status: Other Acute Inpatient Hospital | Payer: Medicare Other | Attending: Internal Medicine | Admitting: Internal Medicine

## 2016-08-05 DIAGNOSIS — R000 Tachycardia, unspecified: Secondary | ICD-10-CM | POA: Diagnosis not present

## 2016-08-05 DIAGNOSIS — D62 Acute posthemorrhagic anemia: Secondary | ICD-10-CM | POA: Diagnosis not present

## 2016-08-05 DIAGNOSIS — I272 Pulmonary hypertension, unspecified: Secondary | ICD-10-CM | POA: Diagnosis not present

## 2016-08-05 DIAGNOSIS — Z9889 Other specified postprocedural states: Secondary | ICD-10-CM | POA: Diagnosis not present

## 2016-08-05 DIAGNOSIS — N179 Acute kidney failure, unspecified: Secondary | ICD-10-CM | POA: Diagnosis not present

## 2016-08-05 DIAGNOSIS — E8809 Other disorders of plasma-protein metabolism, not elsewhere classified: Secondary | ICD-10-CM | POA: Diagnosis not present

## 2016-08-05 DIAGNOSIS — I059 Rheumatic mitral valve disease, unspecified: Secondary | ICD-10-CM | POA: Diagnosis present

## 2016-08-05 DIAGNOSIS — E876 Hypokalemia: Secondary | ICD-10-CM | POA: Diagnosis present

## 2016-08-05 DIAGNOSIS — R918 Other nonspecific abnormal finding of lung field: Secondary | ICD-10-CM | POA: Diagnosis not present

## 2016-08-05 DIAGNOSIS — I2609 Other pulmonary embolism with acute cor pulmonale: Secondary | ICD-10-CM | POA: Diagnosis present

## 2016-08-05 DIAGNOSIS — I2699 Other pulmonary embolism without acute cor pulmonale: Secondary | ICD-10-CM | POA: Diagnosis present

## 2016-08-05 DIAGNOSIS — G8929 Other chronic pain: Secondary | ICD-10-CM | POA: Diagnosis not present

## 2016-08-05 DIAGNOSIS — I368 Other nonrheumatic tricuspid valve disorders: Secondary | ICD-10-CM | POA: Diagnosis not present

## 2016-08-05 DIAGNOSIS — I079 Rheumatic tricuspid valve disease, unspecified: Secondary | ICD-10-CM | POA: Diagnosis present

## 2016-08-05 DIAGNOSIS — R7881 Bacteremia: Secondary | ICD-10-CM | POA: Diagnosis not present

## 2016-08-05 DIAGNOSIS — Z96641 Presence of right artificial hip joint: Secondary | ICD-10-CM | POA: Diagnosis present

## 2016-08-05 DIAGNOSIS — B957 Other staphylococcus as the cause of diseases classified elsewhere: Secondary | ICD-10-CM | POA: Diagnosis present

## 2016-08-05 DIAGNOSIS — I10 Essential (primary) hypertension: Secondary | ICD-10-CM | POA: Diagnosis present

## 2016-08-05 DIAGNOSIS — I330 Acute and subacute infective endocarditis: Secondary | ICD-10-CM

## 2016-08-05 DIAGNOSIS — I2721 Secondary pulmonary arterial hypertension: Secondary | ICD-10-CM | POA: Diagnosis present

## 2016-08-05 DIAGNOSIS — I340 Nonrheumatic mitral (valve) insufficiency: Secondary | ICD-10-CM | POA: Diagnosis not present

## 2016-08-05 DIAGNOSIS — J9610 Chronic respiratory failure, unspecified whether with hypoxia or hypercapnia: Secondary | ICD-10-CM | POA: Diagnosis not present

## 2016-08-05 DIAGNOSIS — I081 Rheumatic disorders of both mitral and tricuspid valves: Secondary | ICD-10-CM | POA: Diagnosis present

## 2016-08-05 DIAGNOSIS — Z9049 Acquired absence of other specified parts of digestive tract: Secondary | ICD-10-CM | POA: Diagnosis not present

## 2016-08-05 DIAGNOSIS — G894 Chronic pain syndrome: Secondary | ICD-10-CM | POA: Diagnosis present

## 2016-08-05 DIAGNOSIS — R0689 Other abnormalities of breathing: Secondary | ICD-10-CM | POA: Diagnosis not present

## 2016-08-05 DIAGNOSIS — I360 Nonrheumatic tricuspid (valve) stenosis: Secondary | ICD-10-CM | POA: Diagnosis not present

## 2016-08-05 DIAGNOSIS — Z8673 Personal history of transient ischemic attack (TIA), and cerebral infarction without residual deficits: Secondary | ICD-10-CM

## 2016-08-05 DIAGNOSIS — T80211A Bloodstream infection due to central venous catheter, initial encounter: Secondary | ICD-10-CM | POA: Diagnosis not present

## 2016-08-05 DIAGNOSIS — Q211 Atrial septal defect: Secondary | ICD-10-CM | POA: Diagnosis not present

## 2016-08-05 DIAGNOSIS — Z9981 Dependence on supplemental oxygen: Secondary | ICD-10-CM | POA: Diagnosis not present

## 2016-08-05 DIAGNOSIS — Z7901 Long term (current) use of anticoagulants: Secondary | ICD-10-CM | POA: Diagnosis not present

## 2016-08-05 DIAGNOSIS — R079 Chest pain, unspecified: Secondary | ICD-10-CM | POA: Diagnosis not present

## 2016-08-05 DIAGNOSIS — Z832 Family history of diseases of the blood and blood-forming organs and certain disorders involving the immune mechanism: Secondary | ICD-10-CM | POA: Diagnosis not present

## 2016-08-05 DIAGNOSIS — R570 Cardiogenic shock: Secondary | ICD-10-CM | POA: Diagnosis not present

## 2016-08-05 DIAGNOSIS — D571 Sickle-cell disease without crisis: Secondary | ICD-10-CM | POA: Diagnosis present

## 2016-08-05 DIAGNOSIS — D638 Anemia in other chronic diseases classified elsewhere: Secondary | ICD-10-CM | POA: Diagnosis present

## 2016-08-05 DIAGNOSIS — Z87891 Personal history of nicotine dependence: Secondary | ICD-10-CM | POA: Diagnosis not present

## 2016-08-05 DIAGNOSIS — J9811 Atelectasis: Secondary | ICD-10-CM | POA: Diagnosis not present

## 2016-08-05 DIAGNOSIS — Y712 Prosthetic and other implants, materials and accessory cardiovascular devices associated with adverse incidents: Secondary | ICD-10-CM | POA: Diagnosis not present

## 2016-08-05 DIAGNOSIS — Z7982 Long term (current) use of aspirin: Secondary | ICD-10-CM | POA: Diagnosis not present

## 2016-08-05 DIAGNOSIS — I639 Cerebral infarction, unspecified: Secondary | ICD-10-CM | POA: Diagnosis not present

## 2016-08-05 DIAGNOSIS — I058 Other rheumatic mitral valve diseases: Secondary | ICD-10-CM | POA: Diagnosis not present

## 2016-08-05 DIAGNOSIS — Z833 Family history of diabetes mellitus: Secondary | ICD-10-CM | POA: Diagnosis not present

## 2016-08-05 DIAGNOSIS — Z86711 Personal history of pulmonary embolism: Secondary | ICD-10-CM | POA: Diagnosis not present

## 2016-08-05 DIAGNOSIS — Z79891 Long term (current) use of opiate analgesic: Secondary | ICD-10-CM | POA: Diagnosis not present

## 2016-08-05 DIAGNOSIS — I4891 Unspecified atrial fibrillation: Secondary | ICD-10-CM | POA: Diagnosis not present

## 2016-08-05 DIAGNOSIS — Z95828 Presence of other vascular implants and grafts: Secondary | ICD-10-CM | POA: Diagnosis not present

## 2016-08-05 DIAGNOSIS — Z79899 Other long term (current) drug therapy: Secondary | ICD-10-CM | POA: Diagnosis not present

## 2016-08-05 DIAGNOSIS — J9611 Chronic respiratory failure with hypoxia: Secondary | ICD-10-CM | POA: Diagnosis not present

## 2016-08-05 DIAGNOSIS — T80219A Unspecified infection due to central venous catheter, initial encounter: Secondary | ICD-10-CM | POA: Diagnosis present

## 2016-08-05 DIAGNOSIS — E561 Deficiency of vitamin K: Secondary | ICD-10-CM | POA: Diagnosis not present

## 2016-08-05 DIAGNOSIS — E559 Vitamin D deficiency, unspecified: Secondary | ICD-10-CM | POA: Diagnosis not present

## 2016-08-05 DIAGNOSIS — G8918 Other acute postprocedural pain: Secondary | ICD-10-CM | POA: Diagnosis not present

## 2016-08-05 DIAGNOSIS — Z86718 Personal history of other venous thrombosis and embolism: Secondary | ICD-10-CM | POA: Diagnosis not present

## 2016-08-05 DIAGNOSIS — D5700 Hb-SS disease with crisis, unspecified: Secondary | ICD-10-CM | POA: Diagnosis present

## 2016-08-05 DIAGNOSIS — Z818 Family history of other mental and behavioral disorders: Secondary | ICD-10-CM | POA: Diagnosis not present

## 2016-08-05 LAB — BLOOD CULTURE ID PANEL (REFLEXED)
Acinetobacter baumannii: NOT DETECTED
CANDIDA ALBICANS: NOT DETECTED
CANDIDA TROPICALIS: NOT DETECTED
Candida glabrata: NOT DETECTED
Candida krusei: NOT DETECTED
Candida parapsilosis: NOT DETECTED
ENTEROBACTERIACEAE SPECIES: NOT DETECTED
Enterobacter cloacae complex: NOT DETECTED
Enterococcus species: NOT DETECTED
Escherichia coli: NOT DETECTED
HAEMOPHILUS INFLUENZAE: NOT DETECTED
KLEBSIELLA PNEUMONIAE: NOT DETECTED
Klebsiella oxytoca: NOT DETECTED
Listeria monocytogenes: NOT DETECTED
METHICILLIN RESISTANCE: DETECTED — AB
NEISSERIA MENINGITIDIS: NOT DETECTED
PROTEUS SPECIES: NOT DETECTED
Pseudomonas aeruginosa: NOT DETECTED
STAPHYLOCOCCUS SPECIES: DETECTED — AB
STREPTOCOCCUS SPECIES: NOT DETECTED
Serratia marcescens: NOT DETECTED
Staphylococcus aureus (BCID): NOT DETECTED
Streptococcus agalactiae: NOT DETECTED
Streptococcus pneumoniae: NOT DETECTED
Streptococcus pyogenes: NOT DETECTED

## 2016-08-05 MED ORDER — HEPARIN SOD (PORK) LOCK FLUSH 100 UNIT/ML IV SOLN
500.0000 [IU] | Freq: Once | INTRAVENOUS | Status: AC
  Administered 2016-08-05: 500 [IU]
  Filled 2016-08-05: qty 5

## 2016-08-05 NOTE — ED Notes (Signed)
Micro reports positive blood cultures drawn yesterday , which are the same positive culture from [facility] that was report yesterday at  [facility] ED in which he left AMA.  Long MD informed and i Pt called and notified per MD to report to [facility] ED or [facility] ED for admission and tx. Pt agreed and states he will arrive at [facility] ED tonight for eval

## 2016-08-05 NOTE — ED Provider Notes (Signed)
[facility] DEPT Provider Note   CSN: [id number] Arrival date & time: 08/05/16  2230     History   Chief Complaint Chief Complaint  Patient presents with  . Positive Blood Cultures   . Sickle Cell Pain Crisis    HPI [name] is a 36 y.o. male.  The history is provided by the patient and medical records.  Sickle Cell Pain Crisis  Associated symptoms: cough      36 year old male with history of sickle cell anemia (hgb SS), hx of PE on xarelto, hypertension, mood disorder, pulmonary hypertension requiring home oxygen, avascular necrosis of the right hip, prior stroke, presenting to the ED for positive blood cultures. Patient initially had positive blood cultures in hospital and we were contacted about this yesterday while he was in the ED. Patient was to be admitted but he decided to sign out AMA. He had a set of cultures drawn yesterday was called today when they turned up positive again for MRSA.  States overall he has been feeling fairly well at home. States he has continued to have some pain in his back and now his ribs. States he is not having any more shortness of breath than normal. He has been using his oxygen at 3 L at baseline. Brother has been sick with a cold and patient has started to have a slight cough. He denies any fever or chills.  No nausea or vomiting. Has been able to eat and drink normally.    Past Medical History:  Diagnosis Date  . Acute chest syndrome ([place]) 06/18/2013  . Acute embolism and thrombosis of right internal jugular vein ([place])   . Alcohol consumption of one to four drinks per day   . Avascular necrosis (HCC)    Right Hip  . Blood transfusion   . Chronic anticoagulation   . Demand ischemia ([place]) 01/02/2014  . Former smoker   . Functional asplenia   . Hb-SS disease with crisis ([place])   . History of Clostridium difficile infection   . History of pulmonary embolus (PE)   . Hypertension   . Hypokalemia   . Leukocytosis    Chronic  . Mood  disorder ([place])   . Noncompliance with medication regimen   . Oxygen deficiency   . Pulmonary hypertension ([place])   . Second hand tobacco smoke exposure   . Sickle cell anemia (HCC)   . Sickle-cell crisis with associated acute chest syndrome ([place]) 05/13/2013  . Stroke ([place])   . Thrombocytosis (HCC)    Chronic  . Uses marijuana     Patient Active Problem List   Diagnosis Date Noted  . Positive blood culture 08/05/2016  . Sickle cell pain crisis ([place]) 08/05/2016  . PE (pulmonary thromboembolism) ([place]) 07/26/2016  . Syncope 07/26/2016  . Acute pain 07/21/2016  . Sickle cell crisis ([place]) 02/23/2016  . H/O arterial ischemic stroke 11/11/2015  . Slurred speech 11/10/2015  . Chronic anemia   . Hb-SS disease without crisis ([place]) 10/07/2014  . Prolonged Q-T interval on ECG 09/10/2014  . Cor pulmonale, chronic ([place]) 08/25/2014  . Sickle cell anemia ([place]) 06/25/2014  . Anemia of chronic disease 06/25/2014  . PAH (pulmonary artery hypertension) ([place]) 03/18/2014  . Chronic respiratory failure with hypoxia ([place]) 03/14/2014  . Paralytic strabismus, external ophthalmoplegia   . Chronic pain syndrome 12/12/2013  . Chronic anticoagulation 08/22/2013  . Essential hypertension 08/22/2013  . Pulmonary HTN ([place]) 06/18/2013  . Functional asplenia   . Vitamin D deficiency 02/13/2013  .  Hx of pulmonary embolus 06/29/2012  . 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 Avascular necrosis [facility])     Past Surgical History:  Procedure Laterality Date  . CHOLECYSTECTOMY     01/2008  . Excision of left periauricular cyst     10/2009  . Excision of right ear lobe cyst with primary closur     11/2007  . Porta cath placement    . Porta cath removal    . PORTACATH PLACEMENT  01/05/2012   Procedure: INSERTION PORT-A-CATH;  Surgeon: Odis Hollingshead, MD;  Location: [facility];  Service: General;  Laterality: N/A;  ultrasound guiced port a cath insertion with fluoroscopy  . Right hip replacement     08/2006  .  UMBILICAL HERNIA REPAIR     01/2008       Home Medications    Prior to Admission medications   Medication Sig Start Date End Date Taking? Authorizing Provider  carvedilol (COREG) 3.125 MG tablet Take 1 tablet (3.125 mg total) by mouth 2 (two) times daily with a meal. 05/17/16  Yes [name], MD  cholecalciferol (VITAMIN D) 1000 units tablet Take 2,000 Units by mouth daily.   Yes [provider]  folic acid (FOLVITE) 1 MG tablet Take 2 tablets (2 mg total) by mouth daily. 04/25/16  Yes [name], MD  gabapentin (NEURONTIN) 300 MG capsule Take 1 capsule (300 mg total) by mouth 3 (three) times daily. 06/01/15  Yes Tresa Garter, MD  HYDROmorphone (DILAUDID) 4 MG tablet Take 1 tablet (4 mg total) by mouth every 4 (four) hours as needed for moderate pain or severe pain. 08/02/16 08/17/16 Yes Jegede, Marlena Clipper, MD  hydroxyurea (HYDREA) 500 MG capsule Take 500 mg by mouth every other day. May take with food to minimize GI side effects. 06/25/16  Yes [provider]  hydroxyurea (HYDREA) 500 MG capsule Take 1,000 mg by mouth every other day. May take with food to minimize GI side effects. 06/24/16  Yes [provider]  L-glutamine (ENDARI) 5 g PACK Powder Packet Take 15 g by mouth 2 (two) times daily. 04/25/16  Yes [name], MD  lisinopril (PRINIVIL,ZESTRIL) 2.5 MG tablet Take 2.5 mg by mouth at bedtime.  05/22/16  Yes [provider]  morphine (MS CONTIN) 30 MG 12 hr tablet Take 1 tablet (30 mg total) by mouth every 12 (twelve) hours. 08/02/16 09/01/16 Yes Tresa Garter, MD  potassium chloride SA (K-DUR,KLOR-CON) 20 MEQ tablet Take 2 tablets (40 mEq total) by mouth daily. 04/27/16  Yes [name], MD  Rivaroxaban 15 & 20 MG TBPK Take as directed on package: Start with one 15mg  tablet by mouth twice a day with food. On Day 22, switch to one 20mg  tablet once a day with food. Patient taking differently: Take 15-20 mg by  mouth 2 (two) times daily. Take as directed on package: Start with one 15mg  tablet by mouth twice a day with food. On Day 22, switch to one 20mg  tablet once a day with food. 07/28/16  Yes [name], MD  torsemide (DEMADEX) 20 MG tablet Take 1 tablet (20 mg total) by mouth daily as needed (leg swelling). 06/13/16  Yes [name], MD  zolpidem (AMBIEN) 10 MG tablet Take 1 tablet (10 mg total) by mouth at bedtime as needed for sleep. 08/04/16 09/03/16 Yes Tresa Garter, MD    Family History Family History  Problem Relation Age of Onset  . Sickle cell trait Mother   .  Depression Mother   . Diabetes Mother   . Sickle cell trait Father   . Sickle cell trait Brother     Social History Social History  Substance Use Topics  . Smoking status: Former Smoker    Packs/day: 0.50    Years: 10.00    Types: Cigarettes    Quit date: 05/29/2011  . Smokeless tobacco: Never Used  . Alcohol use No     Allergies   Patient has no known allergies.   Review of Systems Review of Systems  Constitutional:       + blood cultures  Respiratory: Positive for cough.   Musculoskeletal: Positive for back pain.  All other systems reviewed and are negative.    Physical Exam Updated Vital Signs BP 107/82 (BP Location: Right Arm)   Pulse (!) 124   Temp 99.6 F (37.6 C) (Oral)   Resp 19   Ht 6' (1.829 m)   Wt 87.1 kg (192 lb)   SpO2 93%   BMI 26.04 kg/m   Physical Exam  Constitutional: He is oriented to person, place, and time. He appears well-developed and well-nourished.  Appears somewhat drowsy, non-toxic in appearance  HENT:  Head: Normocephalic and atraumatic.  Mouth/Throat: Oropharynx is clear and moist.  Eyes: Conjunctivae and EOM are normal. Pupils are equal, round, and reactive to light.  Neck: Normal range of motion.  Cardiovascular: Normal rate, regular rhythm and normal heart sounds.   Pulmonary/Chest: Effort normal and breath sounds normal. No respiratory  distress. He has no wheezes.  Abdominal: Soft. Bowel sounds are normal.  Musculoskeletal: Normal range of motion.  Neurological: He is alert and oriented to person, place, and time.  Skin: Skin is warm and dry.  Psychiatric: He has a normal mood and affect.  Nursing note and vitals reviewed.    ED Treatments / Results  Labs (all labs ordered are listed, but only abnormal results are displayed) Labs Reviewed  CBC WITH DIFFERENTIAL/PLATELET - Abnormal; Notable for the following:       Result Value   WBC 25.0 (*)    RBC 2.43 (*)    Hemoglobin 7.4 (*)    HCT 21.6 (*)    RDW 18.3 (*)    Platelets 630 (*)    Neutro Abs 18.9 (*)    Monocytes Absolute 3.5 (*)    Basophils Absolute 0.3 (*)    All other components within normal limits  BASIC METABOLIC PANEL - Abnormal; Notable for the following:    Glucose, Bld 135 (*)    Creatinine, Ser 2.77 (*)    Calcium 8.7 (*)    GFR calc non Af Amer 28 (*)    GFR calc Af Amer 32 (*)    All other components within normal limits  URINE CULTURE  CULTURE, BLOOD (ROUTINE X 2)  CULTURE, BLOOD (ROUTINE X 2)  URINALYSIS, ROUTINE W REFLEX MICROSCOPIC  SODIUM, URINE, RANDOM  CREATININE, URINE, RANDOM  BASIC METABOLIC PANEL  CBC  RETICULOCYTES  I-STAT TROPOININ, ED  I-STAT CG4 LACTIC ACID, ED    EKG  EKG Interpretation None       Radiology Dg Chest 2 View  Result Date: 08/05/2016 CLINICAL DATA:  Positive blood cultures.  Rib pain. EXAM: CHEST  2 VIEW COMPARISON:  Radiographs and CT 07/26/2016 FINDINGS: Left chest port tip in the mid SVC. Stable cardiomegaly. Mild central pulmonary edema. No pleural effusion. No focal airspace disease. No pneumothorax. Stable osseous structures. IMPRESSION: Stable cardiomegaly with mild pulmonary edema. No focal airspace  disease. Electronically Signed   By: Jeb Levering M.D.   On: 08/05/2016 23:46    Procedures Procedures (including critical care time)  Medications Ordered in ED Medications - No data  to display   Initial Impression / Assessment and Plan / ED Course  I have reviewed the triage vital signs and the nursing notes.  Pertinent labs & imaging results that were available during my care of the patient were reviewed by me and considered in my medical decision making (see chart for details).  36 year old male here with positive blood cultures. Get these drawn yesterday and grew out MRSA.   He denies any fever, has had a slight cough and brother has been sick as well. He is afebrile and nontoxic in appearance here. Patient's labs with leukocytosis noted, no left shift.  SrCr also 2.77 (was normal yesterday).  He did receive toradol during ED visit yesterday.  CXR clear.  UA pending. Patient started on IVF and vancomyin.  Repeat blood cultures drawn.  Will admit for ongoing care.  Discussed with hospitalist, Dr. Hal Hope-- will evaluate in the ED and admit.  Final Clinical Impressions(s) / ED Diagnoses   Final diagnoses:  Positive blood culture  AKI (acute kidney injury) [facility])    New Prescriptions New Prescriptions   No medications on file     Larene Pickett, PA-C 08/06/16 [id number]    Varney Biles, MD 08/06/16 2325

## 2016-08-05 NOTE — ED Notes (Addendum)
Pt from home stating he was notified around 2130 that his repeat blood cultures came up positive. Pt states this was to verify a positive blood culture he got at [facility]. Pt states he was seen yesterday for bilateral back pain and he states his pain is 8/10. Pt states he has been taking his home meds, last at 2100. Pt denies fever or chills at home

## 2016-08-06 ENCOUNTER — Encounter (HOSPITAL_COMMUNITY): Payer: Self-pay | Admitting: Internal Medicine

## 2016-08-06 ENCOUNTER — Other Ambulatory Visit (HOSPITAL_COMMUNITY): Payer: Self-pay

## 2016-08-06 DIAGNOSIS — N179 Acute kidney failure, unspecified: Secondary | ICD-10-CM

## 2016-08-06 LAB — CBC
HEMATOCRIT: 20.2 % — AB (ref 39.0–52.0)
Hemoglobin: 7 g/dL — ABNORMAL LOW (ref 13.0–17.0)
MCH: 30.7 pg (ref 26.0–34.0)
MCHC: 34.7 g/dL (ref 30.0–36.0)
MCV: 88.6 fL (ref 78.0–100.0)
Platelets: 601 10*3/uL — ABNORMAL HIGH (ref 150–400)
RBC: 2.28 MIL/uL — AB (ref 4.22–5.81)
RDW: 18.7 % — AB (ref 11.5–15.5)
WBC: 23.2 10*3/uL — AB (ref 4.0–10.5)

## 2016-08-06 LAB — URINALYSIS, ROUTINE W REFLEX MICROSCOPIC
BACTERIA UA: NONE SEEN
GLUCOSE, UA: NEGATIVE mg/dL
HGB URINE DIPSTICK: NEGATIVE
KETONES UR: NEGATIVE mg/dL
Leukocytes, UA: NEGATIVE
Nitrite: NEGATIVE
PROTEIN: 30 mg/dL — AB
Specific Gravity, Urine: 1.018 (ref 1.005–1.030)
pH: 5 (ref 5.0–8.0)

## 2016-08-06 LAB — CBC WITH DIFFERENTIAL/PLATELET
BASOS ABS: 0.3 10*3/uL — AB (ref 0.0–0.1)
BASOS PCT: 1 %
EOS ABS: 0.3 10*3/uL (ref 0.0–0.7)
Eosinophils Relative: 1 %
HCT: 21.6 % — ABNORMAL LOW (ref 39.0–52.0)
Hemoglobin: 7.4 g/dL — ABNORMAL LOW (ref 13.0–17.0)
LYMPHS ABS: 2 10*3/uL (ref 0.7–4.0)
Lymphocytes Relative: 8 %
MCH: 30.5 pg (ref 26.0–34.0)
MCHC: 34.3 g/dL (ref 30.0–36.0)
MCV: 88.9 fL (ref 78.0–100.0)
MONO ABS: 3.5 10*3/uL — AB (ref 0.1–1.0)
Monocytes Relative: 14 %
NEUTROS ABS: 18.9 10*3/uL — AB (ref 1.7–7.7)
Neutrophils Relative %: 76 %
PLATELETS: 630 10*3/uL — AB (ref 150–400)
RBC: 2.43 MIL/uL — ABNORMAL LOW (ref 4.22–5.81)
RDW: 18.3 % — AB (ref 11.5–15.5)
WBC Morphology: INCREASED
WBC: 25 10*3/uL — ABNORMAL HIGH (ref 4.0–10.5)

## 2016-08-06 LAB — BASIC METABOLIC PANEL
Anion gap: 7 (ref 5–15)
Anion gap: 7 (ref 5–15)
BUN: 14 mg/dL (ref 6–20)
BUN: 12 mg/dL (ref 6–20)
CHLORIDE: 106 mmol/L (ref 101–111)
CO2: 22 mmol/L (ref 22–32)
CO2: 22 mmol/L (ref 22–32)
CREATININE: 2.77 mg/dL — AB (ref 0.61–1.24)
Calcium: 8.7 mg/dL — ABNORMAL LOW (ref 8.9–10.3)
Calcium: 8.4 mg/dL — ABNORMAL LOW (ref 8.9–10.3)
Chloride: 107 mmol/L (ref 101–111)
Creatinine, Ser: 2.67 mg/dL — ABNORMAL HIGH (ref 0.61–1.24)
GFR calc Af Amer: 32 mL/min — ABNORMAL LOW (ref >60)
GFR calc Af Amer: 33 mL/min — ABNORMAL LOW (ref >60)
GFR calc non Af Amer: 29 mL/min — ABNORMAL LOW (ref >60)
GFR, EST NON AFRICAN AMERICAN: 28 mL/min — AB (ref >60)
GLUCOSE: 135 mg/dL — AB (ref 65–99)
Glucose, Bld: 125 mg/dL — ABNORMAL HIGH (ref 65–99)
POTASSIUM: 3.8 mmol/L (ref 3.5–5.1)
Potassium: 3.9 mmol/L (ref 3.5–5.1)
SODIUM: 135 mmol/L (ref 135–145)
SODIUM: 136 mmol/L (ref 135–145)

## 2016-08-06 LAB — RETICULOCYTES
RBC.: 2.28 MIL/uL — ABNORMAL LOW (ref 4.22–5.81)
RETIC CT PCT: 8.7 % — AB (ref 0.4–3.1)
Retic Count, Absolute: 198.4 10*3/uL — ABNORMAL HIGH (ref 19.0–186.0)

## 2016-08-06 LAB — I-STAT TROPONIN, ED: Troponin i, poc: 0.01 ng/mL (ref 0.00–0.08)

## 2016-08-06 LAB — CREATININE, URINE, RANDOM: Creatinine, Urine: 471.74 mg/dL

## 2016-08-06 LAB — SODIUM, URINE, RANDOM: Sodium, Ur: 18 mmol/L

## 2016-08-06 LAB — I-STAT CG4 LACTIC ACID, ED: LACTIC ACID, VENOUS: 0.8 mmol/L (ref 0.5–1.9)

## 2016-08-06 MED ORDER — SODIUM CHLORIDE 0.9 % IV BOLUS (SEPSIS)
1000.0000 mL | Freq: Once | INTRAVENOUS | Status: AC
  Administered 2016-08-06: 1000 mL via INTRAVENOUS

## 2016-08-06 MED ORDER — ACETAMINOPHEN 650 MG RE SUPP: 650.0000 mg | Freq: Four times a day (QID) | RECTAL | Status: DC | PRN

## 2016-08-06 MED ORDER — FOLIC ACID 1 MG PO TABS
2.0000 mg | ORAL_TABLET | Freq: Every day | ORAL | Status: DC
  Administered 2016-08-06 – 2016-08-13 (×7): 2 mg via ORAL
  Filled 2016-08-06 (×7): qty 2

## 2016-08-06 MED ORDER — SODIUM CHLORIDE 0.9% FLUSH: 9.0000 mL | INTRAVENOUS | Status: DC | PRN

## 2016-08-06 MED ORDER — DIPHENHYDRAMINE HCL 25 MG PO CAPS
25.0000 mg | ORAL_CAPSULE | Freq: Once | ORAL | Status: AC
  Administered 2016-08-06: 25 mg via ORAL
  Filled 2016-08-06: qty 1

## 2016-08-06 MED ORDER — DIPHENHYDRAMINE HCL 25 MG PO CAPS
25.0000 mg | ORAL_CAPSULE | ORAL | Status: DC | PRN
  Administered 2016-08-07 – 2016-08-08 (×2): 50 mg via ORAL
  Administered 2016-08-09 – 2016-08-13 (×4): 25 mg via ORAL
  Filled 2016-08-06: qty 2
  Filled 2016-08-06 (×3): qty 1
  Filled 2016-08-06: qty 2
  Filled 2016-08-06: qty 1

## 2016-08-06 MED ORDER — HYDROXYUREA 500 MG PO CAPS
500.0000 mg | ORAL_CAPSULE | ORAL | Status: DC
  Administered 2016-08-07 – 2016-08-13 (×3): 500 mg via ORAL
  Filled 2016-08-06 (×4): qty 1

## 2016-08-06 MED ORDER — HYDROMORPHONE 1 MG/ML IV SOLN
INTRAVENOUS | Status: DC
  Administered 2016-08-06: 4.48 mg via INTRAVENOUS
  Administered 2016-08-06: 8.7 mg via INTRAVENOUS
  Administered 2016-08-06 (×2): via INTRAVENOUS
  Administered 2016-08-07: 8.42 mg via INTRAVENOUS
  Administered 2016-08-07: 17:00:00 via INTRAVENOUS
  Administered 2016-08-07: 10.44 mg via INTRAVENOUS
  Administered 2016-08-07: 3.48 mg via INTRAVENOUS
  Administered 2016-08-08: 16.53 mg via INTRAVENOUS
  Administered 2016-08-08: 12:00:00 via INTRAVENOUS
  Administered 2016-08-08: 3.48 mg via INTRAVENOUS
  Administered 2016-08-08: 11.78 mg via INTRAVENOUS
  Filled 2016-08-06 (×3): qty 25

## 2016-08-06 MED ORDER — L-GLUTAMINE ORAL POWDER
15.0000 g | PACK | Freq: Two times a day (BID) | ORAL | Status: DC
  Administered 2016-08-06 – 2016-08-13 (×15): 15 g via ORAL
  Filled 2016-08-06 (×16): qty 3

## 2016-08-06 MED ORDER — HYDROXYUREA 500 MG PO CAPS
1000.0000 mg | ORAL_CAPSULE | ORAL | Status: DC
  Administered 2016-08-08 – 2016-08-12 (×3): 1000 mg via ORAL
  Filled 2016-08-06 (×4): qty 2

## 2016-08-06 MED ORDER — ZOLPIDEM TARTRATE 5 MG PO TABS
10.0000 mg | ORAL_TABLET | Freq: Every evening | ORAL | Status: DC | PRN
  Administered 2016-08-06 – 2016-08-13 (×8): 10 mg via ORAL
  Filled 2016-08-06 (×2): qty 1
  Filled 2016-08-06: qty 2
  Filled 2016-08-06 (×3): qty 1
  Filled 2016-08-06 (×2): qty 2

## 2016-08-06 MED ORDER — GABAPENTIN 300 MG PO CAPS
300.0000 mg | ORAL_CAPSULE | Freq: Three times a day (TID) | ORAL | Status: DC
  Administered 2016-08-06 – 2016-08-13 (×23): 300 mg via ORAL
  Filled 2016-08-06 (×23): qty 1

## 2016-08-06 MED ORDER — VANCOMYCIN HCL IN DEXTROSE 1-5 GM/200ML-% IV SOLN
1000.0000 mg | Freq: Once | INTRAVENOUS | Status: AC
  Administered 2016-08-06: 1000 mg via INTRAVENOUS
  Filled 2016-08-06: qty 200

## 2016-08-06 MED ORDER — ACETAMINOPHEN 325 MG PO TABS
650.0000 mg | ORAL_TABLET | Freq: Four times a day (QID) | ORAL | Status: DC | PRN
  Administered 2016-08-11 – 2016-08-12 (×2): 650 mg via ORAL
  Filled 2016-08-06 (×2): qty 2

## 2016-08-06 MED ORDER — RIVAROXABAN 15 MG PO TABS
15.0000 mg | ORAL_TABLET | Freq: Two times a day (BID) | ORAL | Status: DC
  Administered 2016-08-06 (×2): 15 mg via ORAL
  Filled 2016-08-06 (×2): qty 1

## 2016-08-06 MED ORDER — VITAMIN D 1000 UNITS PO TABS
2000.0000 [IU] | ORAL_TABLET | Freq: Every day | ORAL | Status: DC
  Administered 2016-08-06 – 2016-08-13 (×7): 2000 [IU] via ORAL
  Filled 2016-08-06 (×7): qty 2

## 2016-08-06 MED ORDER — VANCOMYCIN HCL 10 G IV SOLR
1250.0000 mg | INTRAVENOUS | Status: DC
  Administered 2016-08-06: 1250 mg via INTRAVENOUS
  Filled 2016-08-06: qty 1250

## 2016-08-06 MED ORDER — HYDROMORPHONE HCL-NACL 0.5-0.9 MG/ML-% IV SOSY
1.0000 mg | PREFILLED_SYRINGE | Freq: Once | INTRAVENOUS | Status: AC
  Administered 2016-08-06: 1 mg via INTRAVENOUS

## 2016-08-06 MED ORDER — HYDROMORPHONE HCL 1 MG/ML IJ SOLN
1.0000 mg | Freq: Once | INTRAMUSCULAR | Status: AC
  Administered 2016-08-06: 1 mg via INTRAVENOUS
  Filled 2016-08-06: qty 1

## 2016-08-06 MED ORDER — NALOXONE HCL 0.4 MG/ML IJ SOLN: 0.4000 mg | INTRAMUSCULAR | Status: DC | PRN

## 2016-08-06 MED ORDER — SODIUM CHLORIDE 0.45 % IV SOLN
INTRAVENOUS | Status: AC
  Administered 2016-08-06 (×2): via INTRAVENOUS

## 2016-08-06 MED ORDER — SODIUM CHLORIDE 0.9 % IV SOLN
25.0000 mg | INTRAVENOUS | Status: DC | PRN
  Administered 2016-08-06: 25 mg via INTRAVENOUS
  Filled 2016-08-06 (×3): qty 0.5

## 2016-08-06 MED ORDER — SODIUM CHLORIDE 0.9% FLUSH
10.0000 mL | INTRAVENOUS | Status: DC | PRN
  Administered 2016-08-09 – 2016-08-12 (×2): 10 mL
  Filled 2016-08-06 (×2): qty 40

## 2016-08-06 MED ORDER — ONDANSETRON HCL 4 MG/2ML IJ SOLN: 4.0000 mg | Freq: Four times a day (QID) | INTRAMUSCULAR | Status: DC | PRN

## 2016-08-06 MED ORDER — CARVEDILOL 3.125 MG PO TABS
3.1250 mg | ORAL_TABLET | Freq: Two times a day (BID) | ORAL | Status: DC
  Administered 2016-08-06 – 2016-08-13 (×15): 3.125 mg via ORAL
  Filled 2016-08-06 (×15): qty 1

## 2016-08-06 MED ORDER — MORPHINE SULFATE ER 15 MG PO TBCR
30.0000 mg | EXTENDED_RELEASE_TABLET | Freq: Two times a day (BID) | ORAL | Status: DC
  Administered 2016-08-06 – 2016-08-13 (×16): 30 mg via ORAL
  Filled 2016-08-06: qty 2
  Filled 2016-08-06 (×4): qty 1
  Filled 2016-08-06: qty 2
  Filled 2016-08-06: qty 1
  Filled 2016-08-06: qty 2
  Filled 2016-08-06 (×2): qty 1
  Filled 2016-08-06: qty 2
  Filled 2016-08-06 (×3): qty 1
  Filled 2016-08-06 (×2): qty 2

## 2016-08-06 MED ORDER — RIVAROXABAN 20 MG PO TABS: 20.0000 mg | ORAL_TABLET | Freq: Every day | ORAL | Status: DC

## 2016-08-06 MED ORDER — HYDROMORPHONE 1 MG/ML IV SOLN: INTRAVENOUS | Status: DC

## 2016-08-06 NOTE — ED Notes (Signed)
Pt unsuccessfully attempted to provide urine specimen.

## 2016-08-06 NOTE — Progress Notes (Signed)
Pharmacy Antibiotic Note  [name] is a 36 y.o. male admitted on 08/05/2016 with bacteremia.  Pharmacy has been consulted for Vancomycin dosing.  Plan: After 1gm vancomycin in ED, then Vancomycin 1250mg   IV every 24 hours.  Goal trough 15-20 mcg/mL.  Height: 6' (182.9 cm) Weight: 192 lb (87.1 kg) IBW/kg (Calculated) : 77.6  Temp (24hrs), Avg:99.2 F (37.3 C), Min:98.8 F (37.1 C), Max:99.6 F (37.6 C)   Recent Labs Lab 08/04/16 0101 08/04/16 0122 08/04/16 2006 08/06/16 0014 08/06/16 0032 08/06/16 0435  WBC 19.7*  --  20.5* 25.0*  --  23.2*  CREATININE  --  0.60* 0.74 2.77*  --   --   LATICACIDVEN  --   --   --   --  0.80  --     Estimated Creatinine Clearance: 40.1 mL/min (A) (by C-G formula based on SCr of 2.77 mg/dL (H)).    No Known Allergies  Antimicrobials this admission: Vancomycin 08/06/2016 >>   Dose adjustments this admission: -  Microbiology results: pending  Thank you for allowing pharmacy to be a part of this patient's care.  [name] 08/06/2016 5:12 AM

## 2016-08-06 NOTE — H&P (Signed)
History and Physical    [name] IYM:[id number] DOB: 12/05/1980 DOA: 08/05/2016  PCP: Tresa Garter, MD  Patient coming from: Home.  Chief Complaint: Bacteremia and the blood culture.  HPI: [name] is a 36 y.o. male with history of sickle cell anemia, pulmonary hypertension, pulmonary embolism was advised to come to the ER after patient blood cultures done and 08/05/2078 was showing MRSA bacteremia. Prior to this patient had blood cultures done at [facility] which showed staph bacteremia. Patient has such denies any fever or chills productive cough dysuria discharges or diarrhea. Patient does have pain characteristic of his sickle cell anemia. Pain is mostly in the ribs.   ED Course: In the ER labs reveal acute renal failure. Chest x-ray shows mild congestion. Patient was started on vancomycin and admitted for further management of MRSA bacteremia and also on IV fluids for acute renal failure. Urine analysis is pending.  Review of Systems: As per HPI, rest all negative.   Past Medical History:  Diagnosis Date  . Acute chest syndrome ([place]) 06/18/2013  . Acute embolism and thrombosis of right internal jugular vein ([place])   . Alcohol consumption of one to four drinks per day   . Avascular necrosis (HCC)    Right Hip  . Blood transfusion   . Chronic anticoagulation   . Demand ischemia ([place]) 01/02/2014  . Former smoker   . Functional asplenia   . Hb-SS disease with crisis ([place])   . History of Clostridium difficile infection   . History of pulmonary embolus (PE)   . Hypertension   . Hypokalemia   . Leukocytosis    Chronic  . Mood disorder ([place])   . Noncompliance with medication regimen   . Oxygen deficiency   . Pulmonary hypertension ([place])   . Second hand tobacco smoke exposure   . Sickle cell anemia (HCC)   . Sickle-cell crisis with associated acute chest syndrome ([place]) 05/13/2013  . Stroke ([place])   . Thrombocytosis (HCC)    Chronic  .  Uses marijuana     Past Surgical History:  Procedure Laterality Date  . CHOLECYSTECTOMY     01/2008  . Excision of left periauricular cyst     10/2009  . Excision of right ear lobe cyst with primary closur     11/2007  . Porta cath placement    . Porta cath removal    . PORTACATH PLACEMENT  01/05/2012   Procedure: INSERTION PORT-A-CATH;  Surgeon: Odis Hollingshead, MD;  Location: [facility];  Service: General;  Laterality: N/A;  ultrasound guiced port a cath insertion with fluoroscopy  . Right hip replacement     08/2006  . UMBILICAL HERNIA REPAIR     01/2008     reports that he quit smoking about 5 years ago. His smoking use included Cigarettes. He has a 5.00 pack-year smoking history. He has never used smokeless tobacco. He reports that he does not drink alcohol or use drugs.  No Known Allergies  Family History  Problem Relation Age of Onset  . Sickle cell trait Mother   . Depression Mother   . Diabetes Mother   . Sickle cell trait Father   . Sickle cell trait Brother     Prior to Admission medications   Medication Sig Start Date End Date Taking? Authorizing Provider  carvedilol (COREG) 3.125 MG tablet Take 1 tablet (3.125 mg total) by mouth 2 (two) times daily with a meal. 05/17/16  Yes  [name], MD  cholecalciferol (VITAMIN D) 1000 units tablet Take 2,000 Units by mouth daily.   Yes [provider]  folic acid (FOLVITE) 1 MG tablet Take 2 tablets (2 mg total) by mouth daily. 04/25/16  Yes [name], MD  gabapentin (NEURONTIN) 300 MG capsule Take 1 capsule (300 mg total) by mouth 3 (three) times daily. 06/01/15  Yes Tresa Garter, MD  HYDROmorphone (DILAUDID) 4 MG tablet Take 1 tablet (4 mg total) by mouth every 4 (four) hours as needed for moderate pain or severe pain. 08/02/16 08/17/16 Yes Jegede, Marlena Clipper, MD  hydroxyurea (HYDREA) 500 MG capsule Take 500 mg by mouth every other day. May take with food to minimize GI side effects. 06/25/16  Yes  [provider]  hydroxyurea (HYDREA) 500 MG capsule Take 1,000 mg by mouth every other day. May take with food to minimize GI side effects. 06/24/16  Yes [provider]  L-glutamine (ENDARI) 5 g PACK Powder Packet Take 15 g by mouth 2 (two) times daily. 04/25/16  Yes [name], MD  lisinopril (PRINIVIL,ZESTRIL) 2.5 MG tablet Take 2.5 mg by mouth at bedtime.  05/22/16  Yes [provider]  morphine (MS CONTIN) 30 MG 12 hr tablet Take 1 tablet (30 mg total) by mouth every 12 (twelve) hours. 08/02/16 09/01/16 Yes Tresa Garter, MD  potassium chloride SA (K-DUR,KLOR-CON) 20 MEQ tablet Take 2 tablets (40 mEq total) by mouth daily. 04/27/16  Yes [name], MD  Rivaroxaban 15 & 20 MG TBPK Take as directed on package: Start with one 15mg  tablet by mouth twice a day with food. On Day 22, switch to one 20mg  tablet once a day with food. Patient taking differently: Take 15-20 mg by mouth 2 (two) times daily. Take as directed on package: Start with one 15mg  tablet by mouth twice a day with food. On Day 22, switch to one 20mg  tablet once a day with food. 07/28/16  Yes [name], MD  torsemide (DEMADEX) 20 MG tablet Take 1 tablet (20 mg total) by mouth daily as needed (leg swelling). 06/13/16  Yes [name], MD  zolpidem (AMBIEN) 10 MG tablet Take 1 tablet (10 mg total) by mouth at bedtime as needed for sleep. 08/04/16 09/03/16 Yes Tresa Garter, MD    Physical Exam: Vitals:   08/05/16 2311 08/06/16 0019 08/06/16 0100 08/06/16 0214  BP:  101/74 (!) 88/73 112/75  Pulse:  (!) 109 (!) 105 (!) 117  Resp:  18 20 (!) 21  Temp:      TempSrc:      SpO2: 93% 96% 95% 91%  Weight:      Height:          Constitutional: Moderately built and nourished. Vitals:   08/05/16 2311 08/06/16 0019 08/06/16 0100 08/06/16 0214  BP:  101/74 (!) 88/73 112/75  Pulse:  (!) 109 (!) 105 (!) 117  Resp:  18 20 (!) 21  Temp:      TempSrc:      SpO2: 93% 96%  95% 91%  Weight:      Height:       Eyes: Anicteric mild pallor. ENMT: No discharge from the ears eyes nose or mouth. Neck: No mass felt. No neck rigidity. Respiratory: No rhonchi or crepitations. Cardiovascular: S1-S2 heard no murmurs appreciated. Abdomen: Soft nontender bowel sounds present. Musculoskeletal: No edema. No joint effusion. Skin: No rash. Skin appears warm. Neurologic: Alert awake oriented to time  place and person. Moves all extremities. Psychiatric: Appears normal. Normal affect.   Labs on Admission: I have personally reviewed following labs and imaging studies  CBC:  Recent Labs Lab 08/04/16 0101 08/04/16 0122 08/04/16 2006 08/06/16 0014  WBC 19.7*  --  20.5* 25.0*  NEUTROABS 16.2*  --  16.5* 18.9*  HGB 8.0* 8.5* 7.9* 7.4*  HCT 23.1* 25.0* 22.9* 21.6*  MCV 87.5  --  87.4 88.9  PLT 555*  --  603* 409*   Basic Metabolic Panel:  Recent Labs Lab 08/04/16 0122 08/04/16 2006 08/06/16 0014  NA 139 138 136  K 3.4* 3.2* 3.8  CL 105 108 107  CO2  --  22 22  GLUCOSE 108* 102* 135*  BUN 5* 8 12  CREATININE 0.60* 0.74 2.77*  CALCIUM  --  9.0 8.7*   GFR: Estimated Creatinine Clearance: 40.1 mL/min (A) (by C-G formula based on SCr of 2.77 mg/dL (H)). Liver Function Tests:  Recent Labs Lab 08/04/16 2006  AST 29  ALT 26  ALKPHOS 94  BILITOT 3.9*  PROT 8.1  ALBUMIN 4.0   No results for input(s): LIPASE, AMYLASE in the last 168 hours. No results for input(s): AMMONIA in the last 168 hours. Coagulation Profile: No results for input(s): INR, PROTIME in the last 168 hours. Cardiac Enzymes: No results for input(s): CKTOTAL, CKMB, CKMBINDEX, TROPONINI in the last 168 hours. BNP (last 3 results) No results for input(s): PROBNP in the last 8760 hours. HbA1C: No results for input(s): HGBA1C in the last 72 hours. CBG: No results for input(s): GLUCAP in the last 168 hours. Lipid Profile: No results for input(s): CHOL, HDL, LDLCALC, TRIG, CHOLHDL,  LDLDIRECT in the last 72 hours. Thyroid Function Tests: No results for input(s): TSH, T4TOTAL, FREET4, T3FREE, THYROIDAB in the last 72 hours. Anemia Panel:  Recent Labs  08/04/16 0101 08/04/16 2006  RETICCTPCT 5.4* 6.6*   Urine analysis:    Component Value Date/Time   COLORURINE YELLOW 07/26/2016 0447   APPEARANCEUR CLEAR 07/26/2016 0447   LABSPEC 1.039 (H) 07/26/2016 0447   PHURINE 7.0 07/26/2016 0447   GLUCOSEU NEGATIVE 07/26/2016 0447   HGBUR NEGATIVE 07/26/2016 0447   BILIRUBINUR NEGATIVE 07/26/2016 0447   KETONESUR NEGATIVE 07/26/2016 0447   PROTEINUR NEGATIVE 07/26/2016 0447   UROBILINOGEN 0.2 03/24/2015 1426   NITRITE NEGATIVE 07/26/2016 0447   LEUKOCYTESUR NEGATIVE 07/26/2016 0447   Sepsis Labs: @LABRCNTIP (procalcitonin:4,lacticidven:4) ) Recent Results (from the past 240 hour(s))  Blood culture (routine x 2)     Status: None (Preliminary result)   Collection Time: 08/04/16  7:44 PM  Result Value Ref Range Status   Specimen Description BLOOD PORTA CATH  Final   Special Requests   Final    BOTTLES DRAWN AEROBIC AND ANAEROBIC Blood Culture adequate volume   Culture  Setup Time   Final    GRAM POSITIVE COCCI IN CLUSTERS IN BOTH AEROBIC AND ANAEROBIC BOTTLES CRITICAL RESULT CALLED TO, READ BACK BY AND VERIFIED WITH: A. ADKINS, RN [phone] 08/05/2016 T. TYSOR    Culture GRAM POSITIVE COCCI  Final   Report Status PENDING  Incomplete  Blood Culture ID Panel (Reflexed)     Status: Abnormal   Collection Time: 08/04/16  7:44 PM  Result Value Ref Range Status   Enterococcus species NOT DETECTED NOT DETECTED Final   Listeria monocytogenes NOT DETECTED NOT DETECTED Final   Staphylococcus species DETECTED (A) NOT DETECTED Final    Comment: Methicillin (oxacillin) resistant coagulase negative staphylococcus. Possible blood culture contaminant (unless  isolated from more than one blood culture draw or clinical case suggests pathogenicity). No antibiotic treatment is indicated for  blood  culture contaminants. CRITICAL RESULT CALLED TO, READ BACK BY AND VERIFIED WITH: A. ADKINS, RN [phone] 08/05/2016 T. TYSOR    Staphylococcus aureus NOT DETECTED NOT DETECTED Final   Methicillin resistance DETECTED (A) NOT DETECTED Final    Comment: CRITICAL RESULT CALLED TO, READ BACK BY AND VERIFIED WITH: A. ADKINS, RN [phone] 08/05/2016 T. TYSOR    Streptococcus species NOT DETECTED NOT DETECTED Final   Streptococcus agalactiae NOT DETECTED NOT DETECTED Final   Streptococcus pneumoniae NOT DETECTED NOT DETECTED Final   Streptococcus pyogenes NOT DETECTED NOT DETECTED Final   Acinetobacter baumannii NOT DETECTED NOT DETECTED Final   Enterobacteriaceae species NOT DETECTED NOT DETECTED Final   Enterobacter cloacae complex NOT DETECTED NOT DETECTED Final   Escherichia coli NOT DETECTED NOT DETECTED Final   Klebsiella oxytoca NOT DETECTED NOT DETECTED Final   Klebsiella pneumoniae NOT DETECTED NOT DETECTED Final   Proteus species NOT DETECTED NOT DETECTED Final   Serratia marcescens NOT DETECTED NOT DETECTED Final   Haemophilus influenzae NOT DETECTED NOT DETECTED Final   Neisseria meningitidis NOT DETECTED NOT DETECTED Final   Pseudomonas aeruginosa NOT DETECTED NOT DETECTED Final   Candida albicans NOT DETECTED NOT DETECTED Final   Candida glabrata NOT DETECTED NOT DETECTED Final   Candida krusei NOT DETECTED NOT DETECTED Final   Candida parapsilosis NOT DETECTED NOT DETECTED Final   Candida tropicalis NOT DETECTED NOT DETECTED Final     Radiological Exams on Admission: Dg Chest 2 View  Result Date: 08/05/2016 CLINICAL DATA:  Positive blood cultures.  Rib pain. EXAM: CHEST  2 VIEW COMPARISON:  Radiographs and CT 07/26/2016 FINDINGS: Left chest port tip in the mid SVC. Stable cardiomegaly. Mild central pulmonary edema. No pleural effusion. No focal airspace disease. No pneumothorax. Stable osseous structures. IMPRESSION: Stable cardiomegaly with mild pulmonary edema. No focal airspace  disease. Electronically Signed   By: Jeb Levering M.D.   On: 08/05/2016 23:46    EKG: Independently reviewed. Normal sinus rhythm.  Assessment/Plan Active Problems:   Chronic respiratory failure with hypoxia (HCC)   PAH (pulmonary artery hypertension) (HCC)   Sickle cell anemia (HCC)   Bacteremia   H/O arterial ischemic stroke   PE (pulmonary thromboembolism) ([place])   ARF (acute renal failure) ([place])    1. MRSA bacteremia - patient is started on vancomycin. Check 2-D echo. May need infectious disease consult in a.m. 2. Acute renal failure - UA and FENa are pending. Will hold patient's lisinopril and diuretics. Gently hydrate and follow intake and output and metabolic panel. If creatinine does not improve significantly may need further imaging of the kidneys. Patient did receive Toradol when he came to the ER yesterday. Now that patient's altered doses and gabapentin doses may need to be changed if creatinine does not improve. 3. Sickle cell pain crisis - on weight-based Dilaudid PCA. On hydroxyurea and chronic pain medications. Closely monitor for any respiratory depression since patient has renal failure. 4. History of PE on xarelto. If creatinine worsens xarelto dose may need to be changed. 5. Pulmonary hypertension holding off diuretics and lisinopril due to acute renal failure. On Coreg. 6. Sickle cell anemia - follow CBC.   DVT prophylaxis: Xarelto. Code Status: Full code.  Family Communication: Discussed with patient.  Disposition Plan: Home.  Consults called: None.  Admission status: Inpatient.    Rise Patience MD [facility] Pager [phone]-  [phone].  If 7PM-7AM, please contact night-coverage [email or web address] Password [email or web address]  08/06/2016, 2:32 AM

## 2016-08-07 ENCOUNTER — Inpatient Hospital Stay (HOSPITAL_COMMUNITY): Payer: Medicare Other

## 2016-08-07 DIAGNOSIS — I360 Nonrheumatic tricuspid (valve) stenosis: Secondary | ICD-10-CM

## 2016-08-07 DIAGNOSIS — R7881 Bacteremia: Secondary | ICD-10-CM

## 2016-08-07 LAB — BLOOD CULTURE ID PANEL (REFLEXED)
ACINETOBACTER BAUMANNII: NOT DETECTED
CANDIDA ALBICANS: NOT DETECTED
Candida glabrata: NOT DETECTED
Candida krusei: NOT DETECTED
Candida parapsilosis: NOT DETECTED
Candida tropicalis: NOT DETECTED
ENTEROBACTERIACEAE SPECIES: NOT DETECTED
Enterobacter cloacae complex: NOT DETECTED
Enterococcus species: NOT DETECTED
Escherichia coli: NOT DETECTED
HAEMOPHILUS INFLUENZAE: NOT DETECTED
KLEBSIELLA OXYTOCA: NOT DETECTED
Klebsiella pneumoniae: NOT DETECTED
Listeria monocytogenes: NOT DETECTED
METHICILLIN RESISTANCE: DETECTED — AB
NEISSERIA MENINGITIDIS: NOT DETECTED
PSEUDOMONAS AERUGINOSA: NOT DETECTED
Proteus species: NOT DETECTED
STREPTOCOCCUS AGALACTIAE: NOT DETECTED
STREPTOCOCCUS PNEUMONIAE: NOT DETECTED
STREPTOCOCCUS SPECIES: NOT DETECTED
Serratia marcescens: NOT DETECTED
Staphylococcus aureus (BCID): NOT DETECTED
Staphylococcus species: DETECTED — AB
Streptococcus pyogenes: NOT DETECTED

## 2016-08-07 LAB — CREATININE, SERUM
Creatinine, Ser: 1.59 mg/dL — ABNORMAL HIGH (ref 0.61–1.24)
GFR, EST NON AFRICAN AMERICAN: 54 mL/min — AB (ref >60)

## 2016-08-07 LAB — URINE CULTURE: CULTURE: NO GROWTH

## 2016-08-07 LAB — ECHOCARDIOGRAM COMPLETE
Height: 72 in
WEIGHTICAEL: 3174.62 [oz_av]

## 2016-08-07 MED ORDER — VANCOMYCIN HCL 10 G IV SOLR
1250.0000 mg | Freq: Two times a day (BID) | INTRAVENOUS | Status: DC
  Administered 2016-08-07 – 2016-08-08 (×3): 1250 mg via INTRAVENOUS
  Filled 2016-08-07 (×4): qty 1250

## 2016-08-07 MED ORDER — RIVAROXABAN 15 MG PO TABS
15.0000 mg | ORAL_TABLET | Freq: Two times a day (BID) | ORAL | Status: DC
  Administered 2016-08-07 – 2016-08-13 (×13): 15 mg via ORAL
  Filled 2016-08-07 (×13): qty 1

## 2016-08-07 MED ORDER — HYDROMORPHONE HCL-NACL 0.5-0.9 MG/ML-% IV SOSY
1.0000 mg | PREFILLED_SYRINGE | INTRAVENOUS | Status: AC | PRN
  Administered 2016-08-07 (×4): 1 mg via INTRAVENOUS
  Filled 2016-08-07 (×4): qty 2

## 2016-08-07 NOTE — Progress Notes (Signed)
Pharmacy Antibiotic Note  [name] is a 36 y.o. male admitted to the ED on 08/05/2016 for positive blood cultures.  Vancomycin started on admission for GPC in clusters bacteremia.  Today, 08/07/2016: - day #2 abx - afeb, wbc elevated - scr elevated but down 1.59 (crcl~70)   Plan: - with renal function improving, will change vancomycin dose to 1250 mg IV q12h - monitor renal function closely - f/u cultures  ______________________  Height: 6' (182.9 cm) Weight: 198 lb 6.6 oz (90 kg) IBW/kg (Calculated) : 77.6  Temp (24hrs), Avg:98.9 F (37.2 C), Min:98.2 F (36.8 C), Max:99.6 F (37.6 C)   Recent Labs Lab 08/04/16 0101 08/04/16 0122 08/04/16 2006 08/06/16 0014 08/06/16 0032 08/06/16 0435 08/07/16 0324  WBC 19.7*  --  20.5* 25.0*  --  23.2*  --   CREATININE  --  0.60* 0.74 2.77*  --  2.67* 1.59*  LATICACIDVEN  --   --   --   --  0.80  --   --     Estimated Creatinine Clearance: 69.8 mL/min (A) (by C-G formula based on SCr of 1.59 mg/dL (H)).    No Known Allergies  Antimicrobials this admission:  7/7 vanc>>  Dose adjustments this admission:  --  Microbiology results:  7/5 bcx x1: GPC in clusters, BCID CoNS (methicillin resistance) - cx from port 7/6  BCx x2: 1/2 GPC clusters (BCID = MR-CoNS) - cx from port 7/7 ucx:   Thank you for allowing pharmacy to be a part of this patient's care.  Lynelle Doctor 08/07/2016 9:13 AM

## 2016-08-07 NOTE — Care Management Note (Signed)
Case Management Note  Patient Details  Name: [name] MRN: [id number] Date of Birth: 05-20-1980  Subjective/Objective:    MRSA bacteremia, Acute renal failure, Sickle Cell pain crisis                Action/Plan: Discharge Planning: NCM spoke to pt and states his oxygen at home is not working properly. Has oxygen with [facility]. Contacted [facility] rep and they will have rep go by home to check oxygen. Will need a contact. Pt states his mother, Ivin Booty # [phone] is home. Contacted [facility] to make aware.   PCP [name] E MD   Expected Discharge Date:                 Expected Discharge Plan:  Home/Self Care  In-House Referral:  NA  Discharge planning Services  CM Consult  Post Acute Care Choice:  NA Choice offered to:  NA  DME Arranged:  N/A DME Agency:  NA  HH Arranged:  NA HH Agency:  NA  Status of Service:  Completed, signed off  If discussed at [place] of Stay Meetings, dates discussed:    Additional Comments:  Erenest Rasher, RN 08/07/2016, 10:29 AM

## 2016-08-07 NOTE — Progress Notes (Signed)
Subjective: Patient is a 36 year old gentleman with history of sickle l disease who was admitted with sickle cell painful crisis as well as bacteremia.patient is growing Methicillin resistant Coagulase negative Staph from his Port as well as from his peripheral culture today indicating true bacteremia. He has had fever and persistent leucocytosis. He is on Dilaudid PCA since yesterday but complaining oat 9 out of   Not getting adequate relief.  Patient was on Toradol but I discontinued it yesterday due to acute kidney injury creatinine was 2.8.  His creatinine is improving today down to 1.59. His white count is still around 25,000.   Objective: Vital signs in last 24 hours: Temp:  [98.8 F (37.1 C)-99.6 F (37.6 C)] 99.1 F (37.3 C) (07/08 1735) Pulse Rate:  [92-106] 97 (07/08 1735) Resp:  [18-28] 28 (07/08 1735) BP: (106-125)/(68-84) 109/68 (07/08 1735) SpO2:  [86 %-96 %] 91 % (07/08 1735) Weight:  [89.9 kg (198 lb 4.8 oz)-90 kg (198 lb 6.6 oz)] 90 kg (198 lb 6.6 oz) (07/08 0815) Weight change: 2.858 kg (6 lb 4.8 oz) Last BM Date: 08/05/16  Intake/Output from previous day: 07/07 0701 - 07/08 0700 In: 900 [P.O.:600; IV Piggyback:300] Out: 1300 [Urine:1300] Intake/Output this shift: Total I/O In: 970 [P.O.:720; IV Piggyback:250] Out: 1500 [Urine:1500]  General appearance: alert, cooperative, appears stated age and no distress Head: Normocephalic, without obvious abnormality, atraumatic Back: symmetric, no curvature. ROM normal. No CVA tenderness. Resp: clear to auscultation bilaterally Chest wall: no tenderness Cardio: regular rate and rhythm, S1, S2 normal, no murmur, click, rub or gallop GI: soft, non-tender; bowel sounds normal; no masses,  no organomegaly Extremities: extremities normal, atraumatic, no cyanosis or edema Pulses: 2+ and symmetric Skin: Skin color, texture, turgor normal. No rashes or lesions Neurologic: Grossly normal  Lab Results:  Recent Labs   08/06/16 0014 08/06/16 0435  WBC 25.0* 23.2*  HGB 7.4* 7.0*  HCT 21.6* 20.2*  PLT 630* 601*   BMET  Recent Labs  08/06/16 0014 08/06/16 0435 08/07/16 0324  NA 136 135  --   K 3.8 3.9  --   CL 107 106  --   CO2 22 22  --   GLUCOSE 135* 125*  --   BUN 12 14  --   CREATININE 2.77* 2.67* 1.59*  CALCIUM 8.7* 8.4*  --     Studies/Results: Dg Chest 2 View  Result Date: 08/05/2016 CLINICAL DATA:  Positive blood cultures.  Rib pain. EXAM: CHEST  2 VIEW COMPARISON:  Radiographs and CT 07/26/2016 FINDINGS: Left chest port tip in the mid SVC. Stable cardiomegaly. Mild central pulmonary edema. No pleural effusion. No focal airspace disease. No pneumothorax. Stable osseous structures. IMPRESSION: Stable cardiomegaly with mild pulmonary edema. No focal airspace disease. Electronically Signed   By: Jeb Levering M.D.   On: 08/05/2016 23:46    Medications: I have reviewed the patient's current medications.  Assessment/Plan: A 36 year old gentleman admitted with sickle cell painful crisis and bacteremia.  1. Staphylococcal bacteremia: Appears to be methicillin-resistant. Patient currently on vancomycin. He will need TTE and possibly TEE.  I suspect infection of his port.  We'll consult infectious disease and await for culture and sensitivity results.  2.  Sickle cell painful crisis: We will continue with Dilaudid PCA with no Toradol. Continue MS Contin.  I will add physician assisted doses at 1 mg every 4 hours as needed.  3.  Pulmonary hypertension: Patient has  He is hypoxic from that.  Continue oxygenation   4. Chronic  anticoagulation: Continue Xarelto.  5.  Sickle cell anemia: anemia of chronic disease.  Hemoglobin appears stable at baseline. Continue to monitor.  6. Leukocytosis: Due to bacteremia.  Also vaso-occlusive crisis.  Continue t   LOS: 1 day   Tauheedah Bok,[name] 08/07/2016, 6:54 PM

## 2016-08-07 NOTE — Progress Notes (Signed)
PHARMACY - PHYSICIAN COMMUNICATION CRITICAL VALUE ALERT - BLOOD CULTURE IDENTIFICATION (BCID)  Results for orders placed or performed during the hospital encounter of 08/05/16  Blood Culture ID Panel (Reflexed) (Collected: 08/05/2016 12:15 AM)  Result Value Ref Range   Enterococcus species NOT DETECTED NOT DETECTED   Listeria monocytogenes NOT DETECTED NOT DETECTED   Staphylococcus species DETECTED (A) NOT DETECTED   Staphylococcus aureus NOT DETECTED NOT DETECTED   Methicillin resistance DETECTED (A) NOT DETECTED   Streptococcus species NOT DETECTED NOT DETECTED   Streptococcus agalactiae NOT DETECTED NOT DETECTED   Streptococcus pneumoniae NOT DETECTED NOT DETECTED   Streptococcus pyogenes NOT DETECTED NOT DETECTED   Acinetobacter baumannii NOT DETECTED NOT DETECTED   Enterobacteriaceae species NOT DETECTED NOT DETECTED   Enterobacter cloacae complex NOT DETECTED NOT DETECTED   Escherichia coli NOT DETECTED NOT DETECTED   Klebsiella oxytoca NOT DETECTED NOT DETECTED   Klebsiella pneumoniae NOT DETECTED NOT DETECTED   Proteus species NOT DETECTED NOT DETECTED   Serratia marcescens NOT DETECTED NOT DETECTED   Haemophilus influenzae NOT DETECTED NOT DETECTED   Neisseria meningitidis NOT DETECTED NOT DETECTED   Pseudomonas aeruginosa NOT DETECTED NOT DETECTED   Candida albicans NOT DETECTED NOT DETECTED   Candida glabrata NOT DETECTED NOT DETECTED   Candida krusei NOT DETECTED NOT DETECTED   Candida parapsilosis NOT DETECTED NOT DETECTED   Candida tropicalis NOT DETECTED NOT DETECTED    Name of physician (or Provider) Contacted: Dr. Jonelle Sidle  Changes to prescribed antibiotics required: Continue vancomycin   Clovis Riley 08/07/2016  8:04 AM

## 2016-08-07 NOTE — Progress Notes (Signed)
Echocardiogram 2D Echocardiogram has been performed.  Joelene Millin 08/07/2016, 2:32 PM

## 2016-08-08 DIAGNOSIS — I340 Nonrheumatic mitral (valve) insufficiency: Secondary | ICD-10-CM

## 2016-08-08 DIAGNOSIS — J9611 Chronic respiratory failure with hypoxia: Secondary | ICD-10-CM

## 2016-08-08 DIAGNOSIS — Z7901 Long term (current) use of anticoagulants: Secondary | ICD-10-CM

## 2016-08-08 DIAGNOSIS — I2721 Secondary pulmonary arterial hypertension: Secondary | ICD-10-CM

## 2016-08-08 LAB — CBC WITH DIFFERENTIAL/PLATELET
BASOS ABS: 0.2 10*3/uL — AB (ref 0.0–0.1)
Basophils Relative: 1 %
EOS ABS: 0.6 10*3/uL (ref 0.0–0.7)
Eosinophils Relative: 3 %
HCT: 19.2 % — ABNORMAL LOW (ref 39.0–52.0)
Hemoglobin: 6.6 g/dL — CL (ref 13.0–17.0)
LYMPHS ABS: 2 10*3/uL (ref 0.7–4.0)
Lymphocytes Relative: 11 %
MCH: 29.6 pg (ref 26.0–34.0)
MCHC: 34.4 g/dL (ref 30.0–36.0)
MCV: 86.1 fL (ref 78.0–100.0)
Monocytes Absolute: 2 10*3/uL — ABNORMAL HIGH (ref 0.1–1.0)
Monocytes Relative: 11 %
NEUTROS ABS: 13.7 10*3/uL — AB (ref 1.7–7.7)
Neutrophils Relative %: 74 %
Platelets: 569 10*3/uL — ABNORMAL HIGH (ref 150–400)
RBC: 2.23 MIL/uL — ABNORMAL LOW (ref 4.22–5.81)
RDW: 18.5 % — AB (ref 11.5–15.5)
WBC: 18.5 10*3/uL — ABNORMAL HIGH (ref 4.0–10.5)

## 2016-08-08 LAB — COMPREHENSIVE METABOLIC PANEL
ALBUMIN: 3.9 g/dL (ref 3.5–5.0)
ALT: 26 U/L (ref 17–63)
ANION GAP: 6 (ref 5–15)
AST: 35 U/L (ref 15–41)
Alkaline Phosphatase: 95 U/L (ref 38–126)
BUN: 17 mg/dL (ref 6–20)
CO2: 22 mmol/L (ref 22–32)
Calcium: 8.7 mg/dL — ABNORMAL LOW (ref 8.9–10.3)
Chloride: 110 mmol/L (ref 101–111)
Creatinine, Ser: 0.94 mg/dL (ref 0.61–1.24)
GFR calc Af Amer: >60 mL/min (ref >60)
GFR calc non Af Amer: >60 mL/min (ref >60)
GLUCOSE: 111 mg/dL — AB (ref 65–99)
POTASSIUM: 4.2 mmol/L (ref 3.5–5.1)
SODIUM: 138 mmol/L (ref 135–145)
TOTAL PROTEIN: 7.4 g/dL (ref 6.5–8.1)
Total Bilirubin: 2.7 mg/dL — ABNORMAL HIGH (ref 0.3–1.2)

## 2016-08-08 LAB — RETICULOCYTES
RBC.: 2.13 MIL/uL — ABNORMAL LOW (ref 4.22–5.81)
RETIC COUNT ABSOLUTE: 198.1 10*3/uL — AB (ref 19.0–186.0)
Retic Ct Pct: 9.3 % — ABNORMAL HIGH (ref 0.4–3.1)

## 2016-08-08 LAB — CULTURE, BLOOD (ROUTINE X 2): Special Requests: ADEQUATE

## 2016-08-08 LAB — PREPARE RBC (CROSSMATCH)

## 2016-08-08 MED ORDER — SODIUM CHLORIDE 0.9 % IV SOLN
Freq: Once | INTRAVENOUS | Status: AC
  Administered 2016-08-09: 02:00:00 via INTRAVENOUS

## 2016-08-08 MED ORDER — VANCOMYCIN HCL IN DEXTROSE 1-5 GM/200ML-% IV SOLN
1000.0000 mg | Freq: Three times a day (TID) | INTRAVENOUS | Status: DC
  Administered 2016-08-08 – 2016-08-09 (×3): 1000 mg via INTRAVENOUS
  Filled 2016-08-08 (×2): qty 200

## 2016-08-08 MED ORDER — HYDROMORPHONE 1 MG/ML IV SOLN
INTRAVENOUS | Status: DC
  Administered 2016-08-08: 20:00:00 via INTRAVENOUS
  Administered 2016-08-09: 4 mg via INTRAVENOUS
  Administered 2016-08-09: 6 mg via INTRAVENOUS
  Administered 2016-08-09: 4 mg via INTRAVENOUS
  Administered 2016-08-09: 1 mg via INTRAVENOUS
  Administered 2016-08-09: 5.2 mg via INTRAVENOUS
  Administered 2016-08-10: 1.6 mg via INTRAVENOUS
  Administered 2016-08-10: 0.8 mg via INTRAVENOUS
  Administered 2016-08-10: 4.4 mg via INTRAVENOUS
  Filled 2016-08-08 (×3): qty 25

## 2016-08-08 NOTE — Progress Notes (Addendum)
SICKLE CELL SERVICE PROGRESS NOTE  [name] TIW:[id number] DOB: [birth year] DOA: 08/05/2016 PCP: Tresa Garter, MD  Assessment/Plan: Active Problems:   Chronic respiratory failure with hypoxia ([place])   PAH (pulmonary artery hypertension) (H[facility])   Sickle cell anemia ([name])   Bacteremia   H/O arterial ischemic stroke   PE (pulmonary thromboembolism) ([place])   ARF (acute renal failure) ([place])  1. Methacillin Resistant S. Epidermidis Bacteremia: 2-D ECHO shows no obvious vegetations but findings on previous ECHO dictate need for TEE as does the current bacteremia. Will consult Cardiology for TEE. Also continue Vancomycin and consult ID. (Review of records shows that [facility] from [place] from 6/30 showed Coagulase negative staphylococcus Epidermidis). Will await speciation from cultures from this admission. If Methacillin resistant S. Epidermidis will treat with Vancomycin for 14 days. 2. Acute on Chronic Anemia of Chronic Disease: Hb has been at baseline of 8 g/dL since he has been receiving chronic RBC Pheresis. The goal is to keep his Hb at 8 to maximize Oxygenation. Will transfuse 1 unit RBC's.  3. Acute Kidney Injury: Resolved with IVF. 4. Hb SS with vaso-occlusive pain: I a doubtful that the pain being experienced by this patient is actually a sickle cell crisis but rather an exacerbation of chronic pain as the Pre-Pheresis Hb S% have been 24% - 44 % and he is due for Pheresis today. I will speak with his hematologist about the value of transferring to [facility].  5. Chronic Respiratory Failure with Hypoxia: Oxygen requirement at baseline.  6. Mitral regurgitation: The 2-D ECHO from 07/26/2016 demonstrated severe posteriorly directed Mitral Regurgitation. Will ask Cardiology to see for TEE.  7. Recent Acute PE: Continue Xarelto 15 mg BID.    Code Status: Full Code Family Communication: N/A Disposition Plan: Not yet ready for discharge  [place].  Pager [phone]. If 7PM-7AM, please  contact night-coverage.  08/08/2016, 9:56 AM  LOS: 2 days   Interim History: Pt was just discharged from [facility] on 08/02/2016 and says that were it not for a call summoning him to come to the ED he would be at home. Thus he is indicating that his pain would be controllable with oral analgesics. I have discussed with patient the need for antibiotics likely involving home health. Also will speak with Dr. Manuella Ghazi at [facility] again once [facility] speciated to make decision about timing of Pheresis.   Consultants:  Cardiology  Procedures:  None  Antibiotics:  None   Objective: Vitals:   08/08/16 0105 08/08/16 0400 08/08/16 0450 08/08/16 0808  BP: 115/71  105/68   Pulse: 88  88   Resp: (!) 22 (!) 23 13 18   Temp: 99.1 F (37.3 C)  98.5 F (36.9 C)   TempSrc: Oral  Oral   SpO2: 100% 98% 96% 95%  Weight:      Height:       Weight change: 0.052 kg (1.8 oz)  Intake/Output Summary (Last 24 hours) at 08/08/16 0956 Last data filed at 08/08/16 0602  Gross per 24 hour  Intake              850 ml  Output             2550 ml  Net            -1700 ml     Physical Exam General: Alert, awake, oriented x3, in no acute distress.  HEENT: [place]/AT PEERL, EOMI, anicteric Neck: Trachea midline,  no masses, no thyromegal,y no JVD, no  carotid bruit OROPHARYNX:  Moist, No exudate/ erythema/lesions.  Heart: Regular rate and rhythm. II/VI Systolic Ejection Murmur that can be heard throughout the precordium.  No rubs, gallops, heaves or thrills noted.  PMI is  non-displaced Lungs: Clear to auscultation, no wheezing or rhonchi noted. No increased vocal fremitus resonant to percussion  Abdomen: Soft, nontender, nondistended, positive bowel sounds, no masses no hepatosplenomegaly noted.  Neuro: No focal neurological deficits noted cranial nerves II through XII grossly intact. . Strength at baselinein bilateral upper and lower extremities. Musculoskeletal: No warmth swelling or erythema around joints, no  spinal tenderness noted. Psychiatric: Patient alert and oriented x3, good insight and cognition, good recent to remote recall.     Data Reviewed: Basic Metabolic Panel:  Recent Labs Lab 08/04/16 0122 08/04/16 2006 08/06/16 0014 08/06/16 0435 08/07/16 0324 08/08/16 0334  NA 139 138 136 135  --  138  K 3.4* 3.2* 3.8 3.9  --  4.2  CL 105 108 107 106  --  110  CO2  --  22 22 22   --  22  GLUCOSE 108* 102* 135* 125*  --  111*  BUN 5* 8 12 14   --  17  CREATININE 0.60* 0.74 2.77* 2.67* 1.59* 0.94  CALCIUM  --  9.0 8.7* 8.4*  --  8.7*   Liver Function Tests:  Recent Labs Lab 08/04/16 2006 08/08/16 0334  AST 29 35  ALT 26 26  ALKPHOS 94 95  BILITOT 3.9* 2.7*  PROT 8.1 7.4  ALBUMIN 4.0 3.9   No results for input(s): LIPASE, AMYLASE in the last 168 hours. No results for input(s): AMMONIA in the last 168 hours. CBC:  Recent Labs Lab 08/04/16 0101 08/04/16 0122 08/04/16 2006 08/06/16 0014 08/06/16 0435 08/08/16 0334  WBC 19.7*  --  20.5* 25.0* 23.2* 18.5*  NEUTROABS 16.2*  --  16.5* 18.9*  --  13.7*  HGB 8.0* 8.5* 7.9* 7.4* 7.0* 6.6*  HCT 23.1* 25.0* 22.9* 21.6* 20.2* 19.2*  MCV 87.5  --  87.4 88.9 88.6 86.1  PLT 555*  --  603* 630* 601* 569*   Cardiac Enzymes: No results for input(s): CKTOTAL, CKMB, CKMBINDEX, TROPONINI in the last 168 hours. BNP (last 3 results)  Recent Labs  05/16/16 1724 07/01/16 0535 07/26/16 0457  BNP 299.8* 539.3* 494.3*    ProBNP (last 3 results) No results for input(s): PROBNP in the last 8760 hours.  CBG: No results for input(s): GLUCAP in the last 168 hours.     Studies: Dg Chest 2 View  Result Date: 08/05/2016 CLINICAL DATA:  Positive blood cultures.  Rib pain. EXAM: CHEST  2 VIEW COMPARISON:  Radiographs and CT 07/26/2016 FINDINGS: Left chest port tip in the mid SVC. Stable cardiomegaly. Mild central pulmonary edema. No pleural effusion. No focal airspace disease. No pneumothorax. Stable osseous structures. IMPRESSION:  Stable cardiomegaly with mild pulmonary edema. No focal airspace disease. Electronically Signed   By: Jeb Levering M.D.   On: 08/05/2016 23:46   Dg Chest 2 View  Result Date: 07/20/2016 CLINICAL DATA:  36 year old male with sickle cell disease. Bilateral rib and leg pain. EXAM: CHEST  2 VIEW COMPARISON:  Chest CTA 07/18/2016 and earlier. FINDINGS: Stable cardiomegaly and mediastinal contours. Stable left subclavian Port-A-Cath, accessed. Stable to mildly improved lung volumes. No pneumothorax. Stable pulmonary vascularity without acute edema. No pleural effusion or confluent pulmonary opacity. Stable cholecystectomy clips. Negative visible bowel gas pattern. Stable visualized osseous structures, osteopenia and also H-type vertebral body changes better demonstrated  on the recent CTA. IMPRESSION: Stable cardiomegaly. No acute cardiopulmonary abnormality. Electronically Signed   By: Genevie Ann M.D.   On: 07/20/2016 16:47   Dg Chest 2 View  Result Date: 07/18/2016 CLINICAL DATA:  Bilateral chest and back pain, somnolence. Former smoker. History of sickle cell anemia, pulmonary embolus. EXAM: CHEST  2 VIEW COMPARISON:  Portable chest x-ray of July 14, 2016 FINDINGS: The lungs are well-expanded. There is no focal infiltrate. No significant pleural effusion is visible. The cardiac silhouette remains enlarged. The pulmonary vascularity remains engorged. The pulmonary interstitial markings are mildly increased over baseline. The power port catheter tip projects over the cavoatrial junction. The mediastinum is normal in width. The bony thorax exhibits no acute abnormality. IMPRESSION: COPD and smoking related changes, stable. Mild pulmonary interstitial prominence bilaterally suggests low-grade CHF. Stable cardiomegaly. Electronically Signed   By: David  Martinique M.D.   On: 07/18/2016 13:05   Ct Angio Chest Pe W And/or Wo Contrast  Result Date: 07/26/2016 CLINICAL DATA:  36 y/o M; rib pain, abdominal pain,  jaundice, history of sickle cell, and history of asplenia. EXAM: CT ANGIOGRAPHY CHEST WITH CONTRAST TECHNIQUE: Multidetector CT imaging of the chest was performed using the standard protocol during bolus administration of intravenous contrast. Multiplanar CT image reconstructions and MIPs were obtained to evaluate the vascular anatomy. CONTRAST:  100 cc Isovue 370 COMPARISON:  Concurrent CT angiogram of the abdomen and pelvis. 07/18/2016 CT angiogram of the chest. FINDINGS: Cardiovascular: Enlarged main pulmonary artery measuring 4.2 cm. Normal caliber thoracic aorta. Stable severe cardiomegaly. Left port catheter tip projects within the right atrium. No pericardial effusion. Multiple segmental acute pulmonary emboli within the lungs bilaterally best appreciated in the right lower lobe (series 7, image 151). Mediastinum/Nodes: No enlarged mediastinal, hilar, or axillary lymph nodes. Thyroid gland, trachea, and esophagus demonstrate no significant findings. Lungs/Pleura: Stable 6 mm nodule in the right middle lobe. Septal thickening of the lung bases and right perihilar ground-glass opacities probably represents mild pulmonary edema. No pleural effusion. Upper Abdomen: Calcified atrophic spleen. Musculoskeletal: H shaped vertebral body compatible with bony stigmata of sickle cell disease. Review of the MIP images confirms the above findings. IMPRESSION: 1. Multiple segmental acute pulmonary emboli. 2. Stable severe cardiomegaly. 3. Enlarged main pulmonary artery indicates pulmonary artery hypertension. 4. Mild pulmonary edema. 5. Calcified atrophic spleen. These results were called by telephone at the time of interpretation on 07/26/2016 at 3:34 am to Dr. Abigail Butts , who verbally acknowledged these results. Electronically Signed   By: Kristine Garbe M.D.   On: 07/26/2016 03:36   Ct Angio Chest Pe W Or Wo Contrast  Addendum Date: 07/18/2016   ADDENDUM REPORT: 07/18/2016 19:21 ADDENDUM: Add to  IMPRESSION: Suspect a degree of underlying pulmonary arterial hypertension given prominence in the main pulmonary outflow tract. Electronically Signed   By: Lowella Grip III M.D.   On: 07/18/2016 19:21   Result Date: 07/18/2016 CLINICAL DATA:  Chest pain.  History of sickle cell disease EXAM: CT ANGIOGRAPHY CHEST WITH CONTRAST TECHNIQUE: Multidetector CT imaging of the chest was performed using the standard protocol during bolus administration of intravenous contrast. Multiplanar CT image reconstructions and MIPs were obtained to evaluate the vascular anatomy. CONTRAST:  90 mL Isovue 370 nonionic COMPARISON:  Chest radiograph July 18, 2016; chest CT May 06, 2016 FINDINGS: Cardiovascular: There is a pulmonary embolus in a posterior segment right lower lobe pulmonary artery branch, similar to prior study. No more central pulmonary embolus evident. There is not felt  to be of right heart strain. Heart is diffusely enlarged. There is a small pericardial effusion. There is no thoracic aortic aneurysm or dissection. Port-A-Cath tip is at the cavoatrial junction. Main pulmonary outflow tract measures 3.8 cm in diameter consistent with pulmonary arterial hypertension. Mediastinum/Nodes: Visualized thyroid appears normal. There are multiple mediastinal lymph nodes which appear similar to the previous study. There is not felt to be frank adenopathy by size criteria. Lungs/Pleura: There is bibasilar atelectasis. There is interstitial pulmonary edema. There is a stable nodular opacity in the right middle lobe seen on axial slice 51 series 11 measuring 3 mm. There is a stable nodular opacity in the lateral segment of the right middle lobe measuring 6 x 6 mm, ground-glass in appearance, best seen on axial slice 51 series 11. There is no appreciable pleural effusion or pleural thickening. Upper Abdomen: Spleen is small and calcified consistent with prior infarct from sickle cell disease. There is reflux of contrast into the  inferior vena cava and hepatic veins. Visualized upper abdominal structures otherwise appear unremarkable. Musculoskeletal: There are endplate infarcts and multiple thoracic spine levels. Multiple areas of sclerosis noted consistent with sickle cell disease. No appreciable extramedullary hematopoiesis noted. Review of the MIP images confirms the above findings. IMPRESSION: Small pulmonary embolus in a right lower lobe pulmonary artery branch, also present on most recent study. Suspect small chronic pulmonary embolus focus. No new pulmonary embolus compared to most recent study. Findings felt to be indicative of a degree of congestive heart failure. Reflux of contrast into the hepatic veins and inferior vena cava may indicate increase in right heart pressure. Nodular opacities, largest measuring 6 x 6 mm, ground-glass in appearance. This nodular opacity noted in right middle lobe. Initial follow-up with CT at 6-12 months is recommended to confirm persistence. If persistent, repeat CT is recommended every 2 years until 5 years of stability has been established. This recommendation follows the consensus statement: Guidelines for Management of Incidental Pulmonary Nodules Detected on CT Images: From the Fleischner Society 2017; Radiology 2017; 284:228-243. Multiple small lymph nodes, stable. Bony changes and splenic appearance consistent with known sickle cell disease. Critical Value/emergent results were called by telephone at the time of interpretation on 07/18/2016 at 4:18 pm to Dr. Jola Schmidt , who verbally acknowledged these results. Electronically Signed: By: Lowella Grip III M.D. On: 07/18/2016 16:19   Dg Chest Port 1 View  Result Date: 07/26/2016 CLINICAL DATA:  Awakened with chest pain. History of sickle cell disease. EXAM: PORTABLE CHEST 1 VIEW COMPARISON:  07/20/2016 FINDINGS: Unchanged prominent cardiac silhouette. The lungs are clear. No pleural effusions. No pneumothorax. Hilar and mediastinal  contours are unremarkable and unchanged. Left-sided port appears intact, extending into the low SVC. IMPRESSION: Enlarged cardiac silhouette without significant interval change. No consolidation or effusion. Electronically Signed   By: Andreas Newport M.D.   On: 07/26/2016 01:52   Dg Chest Port 1 View  Result Date: 07/14/2016 CLINICAL DATA:  36 year old male with history of sickle cell disease presenting with bilateral chest pain, body pain and shortness of breath today. EXAM: PORTABLE CHEST 1 VIEW COMPARISON:  Chest x-ray 07/03/2016. FINDINGS: Mild blunting of the left costophrenic sulcus and obscuration of the lateral left hemidiaphragm suggesting a small left pleural effusion. No right pleural effusion. No definite consolidative airspace disease. Scattered areas of linear scarring in the lung bases, similar to prior studies. Cephalization of the pulmonary vasculature, without frank pulmonary edema. No pneumothorax. Heart size is mildly enlarged. Upper mediastinal contours  are within normal limits. Left-sided subclavian single-lumen power porta cath with tip terminating at the superior cavoatrial junction. IMPRESSION: 1. New small left pleural effusion. 2. Chronic scarring in the lung bases bilaterally. 3. Cardiomegaly with pulmonary venous congestion, but no frank pulmonary edema. Electronically Signed   By: Vinnie Langton M.D.   On: 07/14/2016 08:07   Ct Angio Abdomen Pelvis  W &/or Wo Contrast  Result Date: 07/26/2016 CLINICAL DATA:  Chest wall pain and abdominal pain. Sickle cell disease. EXAM: CTA ABDOMEN AND PELVIS wITHOUT AND WITH CONTRAST TECHNIQUE: Multidetector CT imaging of the abdomen and pelvis was performed using the standard protocol during bolus administration of intravenous contrast. Multiplanar reconstructed images and MIPs were obtained and reviewed to evaluate the vascular anatomy. CONTRAST:  150 mL Isovue 370 intravenous COMPARISON:  12/15/2015 FINDINGS: VASCULAR Aorta: Normal  caliber with mild atherosclerotic calcification. No dissection or aneurysm. Celiac: Patent without evidence of aneurysm, dissection, vasculitis or significant stenosis. Small calcified spleen. Splenic artery is diminutive, possibly occluded. SMA: Patent without evidence of aneurysm, dissection, vasculitis or significant stenosis. Renals: Both renal arteries are patent without evidence of aneurysm, dissection, vasculitis, fibromuscular dysplasia or significant stenosis. IMA: Patent without evidence of aneurysm, dissection, vasculitis or significant stenosis. Inflow: Normal caliber and intact. Mild atherosclerotic calcification. Proximal Outflow: Bilateral common femoral and visualized portions of the superficial and profunda femoral arteries are patent without evidence of aneurysm, dissection, vasculitis or significant stenosis. Veins: No obvious venous abnormality within the limitations of this arterial phase study. Review of the MIP images confirms the above findings. NON-VASCULAR Hepatobiliary: Cholecystectomy.  No focal liver lesion. Pancreas: Unremarkable. No pancreatic ductal dilatation or surrounding inflammatory changes. Spleen: Small calcified spleen, typical of sickle cell disease. Adrenals/Urinary Tract: Both adrenals are normal. Scattered renal parenchymal scars. Collecting systems and ureters are unremarkable. Urinary bladder is unremarkable. Stomach/Bowel: Stomach is within normal limits. Appendix appears normal. No evidence of bowel wall thickening, distention, or inflammatory changes. Lymphatic: No adenopathy in the abdomen or pelvis. Reproductive: Unremarkable Other: Umbilical hernia containing unobstructed small bowel. 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 No significant skeletal lesion. IMPRESSION: VASCULAR 1. Aortic atherosclerosis. 2. No aneurysm or dissection. Major branches of the aorta are also patent and normal in caliber. NON-VASCULAR 1. Small calcified  spleen, typical of sickle cell disease. 2. Unobstructed small bowel within an umbilical hernia. 3. No acute findings are evident in the abdomen or pelvis. Electronically Signed   By: Andreas Newport M.D.   On: 07/26/2016 04:00    Scheduled Meds: . carvedilol  3.125 mg Oral BID WC  . cholecalciferol  2,000 Units Oral Daily  . folic acid  2 mg Oral Daily  . gabapentin  300 mg Oral TID  . HYDROmorphone   Intravenous Q4H  . hydroxyurea  1,000 mg Oral QODAY  . hydroxyurea  500 mg Oral QODAY  . L-glutamine  15 g Oral BID  . morphine  30 mg Oral Q12H  . Rivaroxaban  15 mg Oral BID WC  . [START ON 08/16/2016] rivaroxaban  20 mg Oral Q supper   Continuous Infusions: . diphenhydrAMINE (BENADRYL) IVPB(SICKLE CELL ONLY) 25 mg (08/06/16 2157)  . vancomycin Stopped (08/07/16 2306)    Active Problems:   Chronic respiratory failure with hypoxia (HCC)   PAH (pulmonary artery hypertension) (HCC)   Sickle cell anemia (H[facility])   Bacteremia   H/O arterial ischemic stroke   PE (pulmonary thromboembolism) ([place])   ARF (acute renal failure) ([place])  In excess of 40 minutes spent during this visit. Greater than 50% involved face to face contact with the patient for assessment, counseling and coordination of care.

## 2016-08-08 NOTE — Progress Notes (Signed)
Pharmacy Antibiotic Note  [name] is a 36 y.o. male admitted to the ED on 08/05/2016 for positive blood cultures.  Vancomycin started on admission for GPC in clusters bacteremia.  Today, 08/08/2016: - day 3 abx - afeb, wbc elevated but improving - AKI resolved   Plan:  With renal function improving, will change vancomycin dose to 1000 mg IV q8 hr  SCr daily  VT as needed ______________________  Height: 6' (182.9 cm) Weight: 198 lb 6.6 oz (90 kg) IBW/kg (Calculated) : 77.6  Temp (24hrs), Avg:99 F (37.2 C), Min:98.5 F (36.9 C), Max:99.2 F (37.3 C)   Recent Labs Lab 08/04/16 0101  08/04/16 2006 08/06/16 0014 08/06/16 0032 08/06/16 0435 08/07/16 0324 08/08/16 0334  WBC 19.7*  --  20.5* 25.0*  --  23.2*  --  18.5*  CREATININE  --   < > 0.74 2.77*  --  2.67* 1.59* 0.94  LATICACIDVEN  --   --   --   --  0.80  --   --   --   < > = values in this interval not displayed.  Estimated Creatinine Clearance: 118.1 mL/min (by C-G formula based on SCr of 0.94 mg/dL).    No Known Allergies  Antimicrobials this admission:  7/7 vanc>>  Dose adjustments this admission:  7/8: inc vanc to 1250mg  q12h 7/9: inc vanc to 1g q8 hr  Microbiology results:  7/5 bcx x1: 2/2 bottles MRSE - cx from port 7/6  BCx x2: 2/2 MRSE (4/4 bottles) - cx from port and peripheral 7/7 ucx: NGF  Thank you for allowing pharmacy to be a part of this patient's care.  Reuel Boom, PharmD, BCPS Pager: [phone] 08/08/2016, 1:55 PM

## 2016-08-09 DIAGNOSIS — Z832 Family history of diseases of the blood and blood-forming organs and certain disorders involving the immune mechanism: Secondary | ICD-10-CM

## 2016-08-09 DIAGNOSIS — D571 Sickle-cell disease without crisis: Secondary | ICD-10-CM

## 2016-08-09 DIAGNOSIS — Z87891 Personal history of nicotine dependence: Secondary | ICD-10-CM

## 2016-08-09 DIAGNOSIS — D638 Anemia in other chronic diseases classified elsewhere: Secondary | ICD-10-CM

## 2016-08-09 DIAGNOSIS — T80211A Bloodstream infection due to central venous catheter, initial encounter: Secondary | ICD-10-CM

## 2016-08-09 DIAGNOSIS — Z833 Family history of diabetes mellitus: Secondary | ICD-10-CM

## 2016-08-09 DIAGNOSIS — G894 Chronic pain syndrome: Secondary | ICD-10-CM

## 2016-08-09 DIAGNOSIS — Z818 Family history of other mental and behavioral disorders: Secondary | ICD-10-CM

## 2016-08-09 DIAGNOSIS — Y712 Prosthetic and other implants, materials and accessory cardiovascular devices associated with adverse incidents: Secondary | ICD-10-CM

## 2016-08-09 LAB — CBC WITH DIFFERENTIAL/PLATELET
BASOS ABS: 0.1 10*3/uL (ref 0.0–0.1)
BASOS PCT: 0 %
EOS PCT: 2 %
Eosinophils Absolute: 0.4 10*3/uL (ref 0.0–0.7)
HEMATOCRIT: 22.8 % — AB (ref 39.0–52.0)
Hemoglobin: 7.9 g/dL — ABNORMAL LOW (ref 13.0–17.0)
LYMPHS PCT: 6 %
Lymphs Abs: 1.5 10*3/uL (ref 0.7–4.0)
MCH: 30 pg (ref 26.0–34.0)
MCHC: 34.6 g/dL (ref 30.0–36.0)
MCV: 86.7 fL (ref 78.0–100.0)
MONO ABS: 2.3 10*3/uL — AB (ref 0.1–1.0)
Monocytes Relative: 10 %
NEUTROS ABS: 19.7 10*3/uL — AB (ref 1.7–7.7)
Neutrophils Relative %: 82 %
PLATELETS: 614 10*3/uL — AB (ref 150–400)
RBC: 2.63 MIL/uL — AB (ref 4.22–5.81)
RDW: 18.4 % — AB (ref 11.5–15.5)
WBC: 23.9 10*3/uL — AB (ref 4.0–10.5)

## 2016-08-09 LAB — CULTURE, BLOOD (ROUTINE X 2)
SPECIAL REQUESTS: ADEQUATE
Special Requests: ADEQUATE

## 2016-08-09 LAB — RETICULOCYTES
RBC.: 2.63 MIL/uL — AB (ref 4.22–5.81)
RETIC COUNT ABSOLUTE: 257.7 10*3/uL — AB (ref 19.0–186.0)
Retic Ct Pct: 9.8 % — ABNORMAL HIGH (ref 0.4–3.1)

## 2016-08-09 LAB — HEMOGLOBINOPATHY EVALUATION
HGB VARIANT: 0 %
Hgb A2 Quant: 3.3 % — ABNORMAL HIGH (ref 1.8–3.2)
Hgb A: 63.4 % — ABNORMAL LOW (ref 96.4–98.8)
Hgb C: 0 %
Hgb F Quant: 3.3 % — ABNORMAL HIGH (ref 0.0–2.0)
Hgb S Quant: 30 % — ABNORMAL HIGH

## 2016-08-09 LAB — CREATININE, SERUM: CREATININE: 0.83 mg/dL (ref 0.61–1.24)

## 2016-08-09 LAB — VANCOMYCIN, TROUGH: VANCOMYCIN TR: 22 ug/mL — AB (ref 15–20)

## 2016-08-09 MED ORDER — VANCOMYCIN HCL IN DEXTROSE 1-5 GM/200ML-% IV SOLN
1000.0000 mg | Freq: Two times a day (BID) | INTRAVENOUS | Status: DC
  Administered 2016-08-10 – 2016-08-12 (×6): 1000 mg via INTRAVENOUS
  Filled 2016-08-09 (×6): qty 200

## 2016-08-09 NOTE — Progress Notes (Signed)
SICKLE CELL SERVICE PROGRESS NOTE  [name] QMV:[id number] DOB: July 11, 1980 DOA: 08/05/2016 PCP: Tresa Garter, MD  Assessment/Plan: Active Problems:   Chronic respiratory failure with hypoxia ([place])   PAH (pulmonary artery hypertension) ([facility])   Sickle cell anemia ([place])   Bacteremia   H/O arterial ischemic stroke   PE (pulmonary thromboembolism) ([place])   ARF (acute renal failure) ([place])  1. MRSE: Continue Vancomycin. For a total of 14 days. He will need repeat cultures for TOC. If cultures remain negative the port can probably be salvaged. However if negative I would probably remove the port. Will defer to ID recommendations as they may have other suggestions to salvage port.  2. Mitral Regurgitation: Pt for TEE tomorrow and then Cardiology will make assessment and recommendations. 3. Hb SS with pain: It is unlikely that patient is in crisis as his percentage of Hb S  has been <50% pre-pheresis. Today it is at 30% which confirms a physiologic state of trait given the serial RBC pheresis Additionally he just received 1 unit of RBC's to keep Hb at recommended 8 g/dL in the setting of pulmonary arterial hypertension. Will schedule oral analgesics and decrease PCA dose. 4. Chronic Pain: Continue MS Contin. Pt needs consultation with Pain specialist as an out-patient. 5. Chronic Respiratory Failure with Hypoxia: Currently at baseline. Continue Oxygen supplementation.  6. 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  7. Acute Kidney Injury: Resolved.   Code Status: Full Code Family Communication: N/A Disposition Plan: Not yet ready for discharge  [place].  Pager [phone]. If 7PM-7AM, please contact night-coverage.  08/09/2016, 3:44 PM  LOS: 3 days   Interim History: Pt states that he feels well. Had a BM. No new c/o at this time.   Consultants:  Dr. Emmit Alexanders  Procedures:  None  Antibiotics:  Vancomycin 7/7 >>    Objective: Vitals:    08/09/16 0819 08/09/16 1100 08/09/16 1306 08/09/16 1331  BP:  (!) 125/102  119/74  Pulse:  (!) 108    Resp: (!) 22 (!) 22 (!) 25   Temp:  (!) 97.2 F (36.2 C)    TempSrc:  Oral    SpO2: 95% 92% 92%   Weight:      Height:       Weight change:   Intake/Output Summary (Last 24 hours) at 08/09/16 1544 Last data filed at 08/09/16 1100  Gross per 24 hour  Intake              734 ml  Output             1150 ml  Net             -416 ml      Physical Exam General: Alert, awake, oriented x3, in no acute distress.  HEENT: [place]/AT PEERL, EOMI, anicteric. Right strabismus at baseline Neck: Trachea midline,  no masses, no thyromegal,y no JVD, no carotid bruit OROPHARYNX:  Moist, No exudate/ erythema/lesions.  Heart: Regular rate and rhythm, II/VI systolic ejection murmurs heard throughout precordium. No rubs, gallops, PMI non-displaced, no heaves or thrills on palpation.  Lungs: Clear to auscultation, no wheezing or rhonchi noted. No increased vocal fremitus resonant to percussion  Abdomen: Soft, nontender, nondistended, positive bowel sounds, no masses no hepatosplenomegaly noted.  Neuro: No focal neurological deficits noted cranial nerves II through XII grossly intact.  Strength at baseline in bilateral upper and lower extremities. Musculoskeletal: No warmth swelling or erythema around joints, no spinal tenderness noted. Psychiatric:  Patient alert and oriented x3, good insight and cognition, good recent to remote recall.    Data Reviewed: Basic Metabolic Panel:  Recent Labs Lab 08/04/16 0122 08/04/16 2006 08/06/16 0014 08/06/16 0435 08/07/16 0324 08/08/16 0334 08/09/16 0519  NA 139 138 136 135  --  138  --   K 3.4* 3.2* 3.8 3.9  --  4.2  --   CL 105 108 107 106  --  110  --   CO2  --  22 22 22   --  22  --   GLUCOSE 108* 102* 135* 125*  --  111*  --   BUN 5* 8 12 14   --  17  --   CREATININE 0.60* 0.74 2.77* 2.67* 1.59* 0.94 0.83  CALCIUM  --  9.0 8.7* 8.4*  --  8.7*  --     Liver Function Tests:  Recent Labs Lab 08/04/16 2006 08/08/16 0334  AST 29 35  ALT 26 26  ALKPHOS 94 95  BILITOT 3.9* 2.7*  PROT 8.1 7.4  ALBUMIN 4.0 3.9   No results for input(s): LIPASE, AMYLASE in the last 168 hours. No results for input(s): AMMONIA in the last 168 hours. CBC:  Recent Labs Lab 08/04/16 0101  08/04/16 2006 08/06/16 0014 08/06/16 0435 08/08/16 0334 08/09/16 1046  WBC 19.7*  --  20.5* 25.0* 23.2* 18.5* 23.9*  NEUTROABS 16.2*  --  16.5* 18.9*  --  13.7* 19.7*  HGB 8.0*  < > 7.9* 7.4* 7.0* 6.6* 7.9*  HCT 23.1*  < > 22.9* 21.6* 20.2* 19.2* 22.8*  MCV 87.5  --  87.4 88.9 88.6 86.1 86.7  PLT 555*  --  603* 630* 601* 569* 614*  < > = values in this interval not displayed. Cardiac Enzymes: No results for input(s): CKTOTAL, CKMB, CKMBINDEX, TROPONINI in the last 168 hours. BNP (last 3 results)  Recent Labs  05/16/16 1724 07/01/16 0535 07/26/16 0457  BNP 299.8* 539.3* 494.3*    ProBNP (last 3 results) No results for input(s): PROBNP in the last 8760 hours.  CBG: No results for input(s): GLUCAP in the last 168 hours.  Recent Results (from the past 240 hour(s))  Blood culture (routine x 2)     Status: Abnormal   Collection Time: 08/04/16  7:44 PM  Result Value Ref Range Status   Specimen Description BLOOD PORTA CATH  Final   Special Requests   Final    BOTTLES DRAWN AEROBIC AND ANAEROBIC Blood Culture adequate volume   Culture  Setup Time   Final    GRAM POSITIVE COCCI IN CLUSTERS IN BOTH AEROBIC AND ANAEROBIC BOTTLES CRITICAL RESULT CALLED TO, READ BACK BY AND VERIFIED WITH: A. ADKINS, RN [phone] 08/05/2016 T. TYSOR    Culture STAPHYLOCOCCUS EPIDERMIDIS (A)  Final   Report Status 08/08/2016 FINAL  Final   Organism ID, Bacteria STAPHYLOCOCCUS EPIDERMIDIS  Final      Susceptibility   Staphylococcus epidermidis - MIC*    CIPROFLOXACIN >=8 RESISTANT Resistant     ERYTHROMYCIN >=8 RESISTANT Resistant     GENTAMICIN <=0.5 SENSITIVE Sensitive      OXACILLIN >=4 RESISTANT Resistant     TETRACYCLINE 2 SENSITIVE Sensitive     VANCOMYCIN 1 SENSITIVE Sensitive     TRIMETH/SULFA <=10 SENSITIVE Sensitive     CLINDAMYCIN <=0.25 SENSITIVE Sensitive     RIFAMPIN <=0.5 SENSITIVE Sensitive     Inducible Clindamycin NEGATIVE Sensitive     * STAPHYLOCOCCUS EPIDERMIDIS  Blood Culture ID Panel (Reflexed)  Status: Abnormal   Collection Time: 08/04/16  7:44 PM  Result Value Ref Range Status   Enterococcus species NOT DETECTED NOT DETECTED Final   Listeria monocytogenes NOT DETECTED NOT DETECTED Final   Staphylococcus species DETECTED (A) NOT DETECTED Final    Comment: Methicillin (oxacillin) resistant coagulase negative staphylococcus. Possible blood culture contaminant (unless isolated from more than one blood culture draw or clinical case suggests pathogenicity). No antibiotic treatment is indicated for blood  culture contaminants. CRITICAL RESULT CALLED TO, READ BACK BY AND VERIFIED WITH: A. ADKINS, RN [phone] 08/05/2016 T. TYSOR    Staphylococcus aureus NOT DETECTED NOT DETECTED Final   Methicillin resistance DETECTED (A) NOT DETECTED Final    Comment: CRITICAL RESULT CALLED TO, READ BACK BY AND VERIFIED WITH: A. ADKINS, RN 2140 08/05/2016 T. TYSOR    Streptococcus species NOT DETECTED NOT DETECTED Final   Streptococcus agalactiae NOT DETECTED NOT DETECTED Final   Streptococcus pneumoniae NOT DETECTED NOT DETECTED Final   Streptococcus pyogenes NOT DETECTED NOT DETECTED Final   Acinetobacter baumannii NOT DETECTED NOT DETECTED Final   Enterobacteriaceae species NOT DETECTED NOT DETECTED Final   Enterobacter cloacae complex NOT DETECTED NOT DETECTED Final   Escherichia coli NOT DETECTED NOT DETECTED Final   Klebsiella oxytoca NOT DETECTED NOT DETECTED Final   Klebsiella pneumoniae NOT DETECTED NOT DETECTED Final   Proteus species NOT DETECTED NOT DETECTED Final   Serratia marcescens NOT DETECTED NOT DETECTED Final   Haemophilus influenzae NOT  DETECTED NOT DETECTED Final   Neisseria meningitidis NOT DETECTED NOT DETECTED Final   Pseudomonas aeruginosa NOT DETECTED NOT DETECTED Final   Candida albicans NOT DETECTED NOT DETECTED Final   Candida glabrata NOT DETECTED NOT DETECTED Final   Candida krusei NOT DETECTED NOT DETECTED Final   Candida parapsilosis NOT DETECTED NOT DETECTED Final   Candida tropicalis NOT DETECTED NOT DETECTED Final  Blood culture (routine x 2)     Status: Abnormal   Collection Time: 08/05/16 12:15 AM  Result Value Ref Range Status   Specimen Description BLOOD PORT  Final   Special Requests   Final    BOTTLES DRAWN AEROBIC AND ANAEROBIC Blood Culture adequate volume   Culture  Setup Time   Final    IN BOTH AEROBIC AND ANAEROBIC BOTTLES GRAM POSITIVE COCCI IN CLUSTERS CRITICAL RESULT CALLED TO, READ BACK BY AND VERIFIED WITH: PHARMD E JACKSON [phone] MLM    Culture (A)  Final    STAPHYLOCOCCUS EPIDERMIDIS SUSCEPTIBILITIES PERFORMED ON PREVIOUS CULTURE WITHIN THE LAST 5 DAYS. Performed at [facility], [place] [address], [place], [place] [address]    Report Status 08/09/2016 FINAL  Final  Blood Culture ID Panel (Reflexed)     Status: Abnormal   Collection Time: 08/05/16 12:15 AM  Result Value Ref Range Status   Enterococcus species NOT DETECTED NOT DETECTED Final   Listeria monocytogenes NOT DETECTED NOT DETECTED Final   Staphylococcus species DETECTED (A) NOT DETECTED Final    Comment: Methicillin (oxacillin) resistant coagulase negative staphylococcus. Possible blood culture contaminant (unless isolated from more than one blood culture draw or clinical case suggests pathogenicity). No antibiotic treatment is indicated for blood  culture contaminants. CRITICAL RESULT CALLED TO, READ BACK BY AND VERIFIED WITH: PHARMD E JACKSON [phone] MLM    Staphylococcus aureus NOT DETECTED NOT DETECTED Final   Methicillin resistance DETECTED (A) NOT DETECTED Final    Comment: CRITICAL RESULT CALLED TO,  READ BACK BY AND VERIFIED WITH: Gustavo Lah [phone]  MLM    Streptococcus species NOT DETECTED NOT DETECTED Final   Streptococcus agalactiae NOT DETECTED NOT DETECTED Final   Streptococcus pneumoniae NOT DETECTED NOT DETECTED Final   Streptococcus pyogenes NOT DETECTED NOT DETECTED Final   Acinetobacter baumannii NOT DETECTED NOT DETECTED Final   Enterobacteriaceae species NOT DETECTED NOT DETECTED Final   Enterobacter cloacae complex NOT DETECTED NOT DETECTED Final   Escherichia coli NOT DETECTED NOT DETECTED Final   Klebsiella oxytoca NOT DETECTED NOT DETECTED Final   Klebsiella pneumoniae NOT DETECTED NOT DETECTED Final   Proteus species NOT DETECTED NOT DETECTED Final   Serratia marcescens NOT DETECTED NOT DETECTED Final   Haemophilus influenzae NOT DETECTED NOT DETECTED Final   Neisseria meningitidis NOT DETECTED NOT DETECTED Final   Pseudomonas aeruginosa NOT DETECTED NOT DETECTED Final   Candida albicans NOT DETECTED NOT DETECTED Final   Candida glabrata NOT DETECTED NOT DETECTED Final   Candida krusei NOT DETECTED NOT DETECTED Final   Candida parapsilosis NOT DETECTED NOT DETECTED Final   Candida tropicalis NOT DETECTED NOT DETECTED Final    Comment: Performed at [facility], [place] [address], [place], [place] [address]  Blood culture (routine x 2)     Status: Abnormal   Collection Time: 08/05/16 11:17 PM  Result Value Ref Range Status   Specimen Description BLOOD RIGHT HAND  Final   Special Requests   Final    BOTTLES DRAWN AEROBIC AND ANAEROBIC Blood Culture adequate volume   Culture  Setup Time   Final    GRAM POSITIVE COCCI IN CLUSTERS IN BOTH AEROBIC AND ANAEROBIC BOTTLES CRITICAL RESULT CALLED TO, READ BACK BY AND VERIFIED WITH: E JACKSON,PHARMD AT 1226 08/07/16 BY L BENFIELD    Culture (A)  Final    STAPHYLOCOCCUS EPIDERMIDIS SUSCEPTIBILITIES PERFORMED ON PREVIOUS CULTURE WITHIN THE LAST 5 DAYS. Performed at [facility], [place] [address]  [place], [place] [address]    Report Status 08/09/2016 FINAL  Final  Urine culture     Status: None   Collection Time: 08/06/16 11:40 AM  Result Value Ref Range Status   Specimen Description URINE, RANDOM  Final   Special Requests NONE  Final   Culture   Final    NO GROWTH Performed at [facility], [place] [address], [place], [place] [address]    Report Status 08/07/2016 FINAL  Final     Studies: Dg Chest 2 View  Result Date: 08/05/2016 CLINICAL DATA:  Positive blood cultures.  Rib pain. EXAM: CHEST  2 VIEW COMPARISON:  Radiographs and CT 07/26/2016 FINDINGS: Left chest port tip in the mid SVC. Stable cardiomegaly. Mild central pulmonary edema. No pleural effusion. No focal airspace disease. No pneumothorax. Stable osseous structures. IMPRESSION: Stable cardiomegaly with mild pulmonary edema. No focal airspace disease. Electronically Signed   By: Jeb Levering M.D.   On: 08/05/2016 23:46   Dg Chest 2 View  Result Date: 07/20/2016 CLINICAL DATA:  36 year old male with sickle cell disease. Bilateral rib and leg pain. EXAM: CHEST  2 VIEW COMPARISON:  Chest CTA 07/18/2016 and earlier. FINDINGS: Stable cardiomegaly and mediastinal contours. Stable left subclavian Port-A-Cath, accessed. Stable to mildly improved lung volumes. No pneumothorax. Stable pulmonary vascularity without acute edema. No pleural effusion or confluent pulmonary opacity. Stable cholecystectomy clips. Negative visible bowel gas pattern. Stable visualized osseous structures, osteopenia and also H-type vertebral body changes better demonstrated on the recent CTA. IMPRESSION: Stable cardiomegaly. No acute cardiopulmonary abnormality. Electronically Signed   By: Lemmie Evens  [place] Crane M.D.   On: 07/20/2016 16:47   Dg Chest 2 View  Result Date: 07/18/2016 CLINICAL DATA:  Bilateral chest and back pain, somnolence. Former smoker. History of sickle cell anemia, pulmonary embolus. EXAM: CHEST  2 VIEW COMPARISON:  Portable chest x-ray of July 14, 2016 FINDINGS: The lungs are well-expanded. There is no focal infiltrate. No significant pleural effusion is visible. The cardiac silhouette remains enlarged. The pulmonary vascularity remains engorged. The pulmonary interstitial markings are mildly increased over baseline. The power port catheter tip projects over the cavoatrial junction. The mediastinum is normal in width. The bony thorax exhibits no acute abnormality. IMPRESSION: COPD and smoking related changes, stable. Mild pulmonary interstitial prominence bilaterally suggests low-grade CHF. Stable cardiomegaly. Electronically Signed   By: David  Martinique M.D.   On: 07/18/2016 13:05   Ct Angio Chest Pe W And/or Wo Contrast  Result Date: 07/26/2016 CLINICAL DATA:  36 y/o M; rib pain, abdominal pain, jaundice, history of sickle cell, and history of asplenia. EXAM: CT ANGIOGRAPHY CHEST WITH CONTRAST TECHNIQUE: Multidetector CT imaging of the chest was performed using the standard protocol during bolus administration of intravenous contrast. Multiplanar CT image reconstructions and MIPs were obtained to evaluate the vascular anatomy. CONTRAST:  100 cc Isovue 370 COMPARISON:  Concurrent CT angiogram of the abdomen and pelvis. 07/18/2016 CT angiogram of the chest. FINDINGS: Cardiovascular: Enlarged main pulmonary artery measuring 4.2 cm. Normal caliber thoracic aorta. Stable severe cardiomegaly. Left port catheter tip projects within the right atrium. No pericardial effusion. Multiple segmental acute pulmonary emboli within the lungs bilaterally best appreciated in the right lower lobe (series 7, image 151). Mediastinum/Nodes: No enlarged mediastinal, hilar, or axillary lymph nodes. Thyroid gland, trachea, and esophagus demonstrate no significant findings. Lungs/Pleura: Stable 6 mm nodule in the right middle lobe. Septal thickening of the lung bases and right perihilar ground-glass opacities probably represents mild pulmonary edema. No pleural effusion. Upper  Abdomen: Calcified atrophic spleen. Musculoskeletal: H shaped vertebral body compatible with bony stigmata of sickle cell disease. Review of the MIP images confirms the above findings. IMPRESSION: 1. Multiple segmental acute pulmonary emboli. 2. Stable severe cardiomegaly. 3. Enlarged main pulmonary artery indicates pulmonary artery hypertension. 4. Mild pulmonary edema. 5. Calcified atrophic spleen. These results were called by telephone at the time of interpretation on 07/26/2016 at 3:34 am to Dr. Abigail Butts , who verbally acknowledged these results. Electronically Signed   By: Kristine Garbe M.D.   On: 07/26/2016 03:36   Ct Angio Chest Pe W Or Wo Contrast  Addendum Date: 07/18/2016   ADDENDUM REPORT: 07/18/2016 19:21 ADDENDUM: Add to IMPRESSION: Suspect a degree of underlying pulmonary arterial hypertension given prominence in the main pulmonary outflow tract. Electronically Signed   By: Lowella Grip III M.D.   On: 07/18/2016 19:21   Result Date: 07/18/2016 CLINICAL DATA:  Chest pain.  History of sickle cell disease EXAM: CT ANGIOGRAPHY CHEST WITH CONTRAST TECHNIQUE: Multidetector CT imaging of the chest was performed using the standard protocol during bolus administration of intravenous contrast. Multiplanar CT image reconstructions and MIPs were obtained to evaluate the vascular anatomy. CONTRAST:  90 mL Isovue 370 nonionic COMPARISON:  Chest radiograph July 18, 2016; chest CT May 06, 2016 FINDINGS: Cardiovascular: There is a pulmonary embolus in a posterior segment right lower lobe pulmonary artery branch, similar to prior study. No more central pulmonary embolus evident. There is not felt to be of right heart strain. Heart is diffusely enlarged. There is a small pericardial effusion. There is  no thoracic aortic aneurysm or dissection. Port-A-Cath tip is at the cavoatrial junction. Main pulmonary outflow tract measures 3.8 cm in diameter consistent with pulmonary arterial  hypertension. Mediastinum/Nodes: Visualized thyroid appears normal. There are multiple mediastinal lymph nodes which appear similar to the previous study. There is not felt to be frank adenopathy by size criteria. Lungs/Pleura: There is bibasilar atelectasis. There is interstitial pulmonary edema. There is a stable nodular opacity in the right middle lobe seen on axial slice 51 series 11 measuring 3 mm. There is a stable nodular opacity in the lateral segment of the right middle lobe measuring 6 x 6 mm, ground-glass in appearance, best seen on axial slice 51 series 11. There is no appreciable pleural effusion or pleural thickening. Upper Abdomen: Spleen is small and calcified consistent with prior infarct from sickle cell disease. There is reflux of contrast into the inferior vena cava and hepatic veins. Visualized upper abdominal structures otherwise appear unremarkable. Musculoskeletal: There are endplate infarcts and multiple thoracic spine levels. Multiple areas of sclerosis noted consistent with sickle cell disease. No appreciable extramedullary hematopoiesis noted. Review of the MIP images confirms the above findings. IMPRESSION: Small pulmonary embolus in a right lower lobe pulmonary artery branch, also present on most recent study. Suspect small chronic pulmonary embolus focus. No new pulmonary embolus compared to most recent study. Findings felt to be indicative of a degree of congestive heart failure. Reflux of contrast into the hepatic veins and inferior vena cava may indicate increase in right heart pressure. Nodular opacities, largest measuring 6 x 6 mm, ground-glass in appearance. This nodular opacity noted in right middle lobe. Initial follow-up with CT at 6-12 months is recommended to confirm persistence. If persistent, repeat CT is recommended every 2 years until 5 years of stability has been established. This recommendation follows the consensus statement: Guidelines for Management of Incidental  Pulmonary Nodules Detected on CT Images: From the Fleischner Society 2017; Radiology 2017; 284:228-243. Multiple small lymph nodes, stable. Bony changes and splenic appearance consistent with known sickle cell disease. Critical Value/emergent results were called by telephone at the time of interpretation on 07/18/2016 at 4:18 pm to Dr. Jola Schmidt , who verbally acknowledged these results. Electronically Signed: By: Lowella Grip III M.D. On: 07/18/2016 16:19   Dg Chest Port 1 View  Result Date: 07/26/2016 CLINICAL DATA:  Awakened with chest pain. History of sickle cell disease. EXAM: PORTABLE CHEST 1 VIEW COMPARISON:  07/20/2016 FINDINGS: Unchanged prominent cardiac silhouette. The lungs are clear. No pleural effusions. No pneumothorax. Hilar and mediastinal contours are unremarkable and unchanged. Left-sided port appears intact, extending into the low SVC. IMPRESSION: Enlarged cardiac silhouette without significant interval change. No consolidation or effusion. Electronically Signed   By: Andreas Newport M.D.   On: 07/26/2016 01:52   Dg Chest Port 1 View  Result Date: 07/14/2016 CLINICAL DATA:  36 year old male with history of sickle cell disease presenting with bilateral chest pain, body pain and shortness of breath today. EXAM: PORTABLE CHEST 1 VIEW COMPARISON:  Chest x-ray 07/03/2016. FINDINGS: Mild blunting of the left costophrenic sulcus and obscuration of the lateral left hemidiaphragm suggesting a small left pleural effusion. No right pleural effusion. No definite consolidative airspace disease. Scattered areas of linear scarring in the lung bases, similar to prior studies. Cephalization of the pulmonary vasculature, without frank pulmonary edema. No pneumothorax. Heart size is mildly enlarged. Upper mediastinal contours are within normal limits. Left-sided subclavian single-lumen power porta cath with tip terminating at the superior cavoatrial junction.  IMPRESSION: 1. New small left pleural  effusion. 2. Chronic scarring in the lung bases bilaterally. 3. Cardiomegaly with pulmonary venous congestion, but no frank pulmonary edema. Electronically Signed   By: Vinnie Langton M.D.   On: 07/14/2016 08:07   Ct Angio Abdomen Pelvis  W &/or Wo Contrast  Result Date: 07/26/2016 CLINICAL DATA:  Chest wall pain and abdominal pain. Sickle cell disease. EXAM: CTA ABDOMEN AND PELVIS wITHOUT AND WITH CONTRAST TECHNIQUE: Multidetector CT imaging of the abdomen and pelvis was performed using the standard protocol during bolus administration of intravenous contrast. Multiplanar reconstructed images and MIPs were obtained and reviewed to evaluate the vascular anatomy. CONTRAST:  150 mL Isovue 370 intravenous COMPARISON:  12/15/2015 FINDINGS: VASCULAR Aorta: Normal caliber with mild atherosclerotic calcification. No dissection or aneurysm. Celiac: Patent without evidence of aneurysm, dissection, vasculitis or significant stenosis. Small calcified spleen. Splenic artery is diminutive, possibly occluded. SMA: Patent without evidence of aneurysm, dissection, vasculitis or significant stenosis. Renals: Both renal arteries are patent without evidence of aneurysm, dissection, vasculitis, fibromuscular dysplasia or significant stenosis. IMA: Patent without evidence of aneurysm, dissection, vasculitis or significant stenosis. Inflow: Normal caliber and intact. Mild atherosclerotic calcification. Proximal Outflow: Bilateral common femoral and visualized portions of the superficial and profunda femoral arteries are patent without evidence of aneurysm, dissection, vasculitis or significant stenosis. Veins: No obvious venous abnormality within the limitations of this arterial phase study. Review of the MIP images confirms the above findings. NON-VASCULAR Hepatobiliary: Cholecystectomy.  No focal liver lesion. Pancreas: Unremarkable. No pancreatic ductal dilatation or surrounding inflammatory changes. Spleen: Small calcified  spleen, typical of sickle cell disease. Adrenals/Urinary Tract: Both adrenals are normal. Scattered renal parenchymal scars. Collecting systems and ureters are unremarkable. Urinary bladder is unremarkable. Stomach/Bowel: Stomach is within normal limits. Appendix appears normal. No evidence of bowel wall thickening, distention, or inflammatory changes. Lymphatic: No adenopathy in the abdomen or pelvis. Reproductive: Unremarkable Other: Umbilical hernia containing unobstructed small bowel. 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 No significant skeletal lesion. IMPRESSION: VASCULAR 1. Aortic atherosclerosis. 2. No aneurysm or dissection. Major branches of the aorta are also patent and normal in caliber. NON-VASCULAR 1. Small calcified spleen, typical of sickle cell disease. 2. Unobstructed small bowel within an umbilical hernia. 3. No acute findings are evident in the abdomen or pelvis. Electronically Signed   By: Andreas Newport M.D.   On: 07/26/2016 04:00    Scheduled Meds: . carvedilol  3.125 mg Oral BID WC  . cholecalciferol  2,000 Units Oral Daily  . folic acid  2 mg Oral Daily  . gabapentin  300 mg Oral TID  . HYDROmorphone   Intravenous Q4H  . hydroxyurea  1,000 mg Oral QODAY  . hydroxyurea  500 mg Oral QODAY  . L-glutamine  15 g Oral BID  . morphine  30 mg Oral Q12H  . Rivaroxaban  15 mg Oral BID WC  . [START ON 08/16/2016] rivaroxaban  20 mg Oral Q supper   Continuous Infusions: . diphenhydrAMINE (BENADRYL) IVPB(SICKLE CELL ONLY) 25 mg (08/06/16 2157)  . vancomycin Stopped (08/09/16 1306)    Active Problems:   Chronic respiratory failure with hypoxia (HCC)   PAH (pulmonary artery hypertension) (HCC)   Sickle cell anemia (HCC)   Bacteremia   H/O arterial ischemic stroke   PE (pulmonary thromboembolism) (HCC)   ARF (acute renal failure) (HCC)   In excess of 25 minutes spent during this visit. Greater than 50% involved face to face contact with  the  patient for assessment, counseling and coordination of care.

## 2016-08-09 NOTE — Consult Note (Signed)
[facility]       Reason for Consult: Staph epidermidis line infection    Referring Physician: Dr. Zigmund Daniel  Active Problems:   Chronic respiratory failure with hypoxia ([place])   PAH (pulmonary artery hypertension) (HCC)   Sickle cell anemia (HCC)   Bacteremia   H/O arterial ischemic stroke   PE (pulmonary thromboembolism) ([place])   ARF (acute renal failure) ([place])   . carvedilol  3.125 mg Oral BID WC  . cholecalciferol  2,000 Units Oral Daily  . folic acid  2 mg Oral Daily  . gabapentin  300 mg Oral TID  . HYDROmorphone   Intravenous Q4H  . hydroxyurea  1,000 mg Oral QODAY  . hydroxyurea  500 mg Oral QODAY  . L-glutamine  15 g Oral BID  . morphine  30 mg Oral Q12H  . Rivaroxaban  15 mg Oral BID WC  . [START ON 08/16/2016] rivaroxaban  20 mg Oral Q supper    Recommendations: Repeat blood culture (ordered) Vancomycin 14 days from negative blood culture (tomorrow) through July 24th  Assessment: He has Staph epidermidis bacteremia in the setting of a port.  He was at [facility] for a SS pain crises and did have blood cultures done there and was 1/2 positive and with the patient doing well, was appropriately considered a contaminate.  Repeat cultures though were done and again grew CoNS and positive here as well after admission confirming this to be a real infection.  Treatment is with vancomycin since it is methicillin resistant and as long as blood cultures remain negative now, it is reasonable to attempt line salvage by treating for 14 days with vancomycin.  New positive blood cultures will require line removal.   HIV negative this year  Antibiotics: Vancomycin day 4  HPI: [name] is a 36 y.o. male with SS and came in to the hospital by request of [facility] after a positive Staph epi blood culture.  One was done on his admission for chest pain and repeated prior to discharge and did grow as above.  His current port-acath was placed under  flouro by Dr. Zella Richer in 2013 due to multiple hospitalizations.  No previous bacteremias with this line.    No fever, no chills.  No associated diarrhea.   Review of Systems:  Constitutional: negative for fevers, malaise and anorexia Respiratory: negative for cough or sputum Integument/breast: negative for rash All other systems reviewed and are negative    Past Medical History:  Diagnosis Date  . Acute chest syndrome ([place]) 06/18/2013  . Acute embolism and thrombosis of right internal jugular vein ([place])   . Alcohol consumption of one to four drinks per day   . Avascular necrosis (HCC)    Right Hip  . Blood transfusion   . Chronic anticoagulation   . Demand ischemia ([place]) 01/02/2014  . Former smoker   . Functional asplenia   . Hb-SS disease with crisis ([place])   . History of Clostridium difficile infection   . History of pulmonary embolus (PE)   . Hypertension   . Hypokalemia   . Leukocytosis    Chronic  . Mood disorder ([place])   . Noncompliance with medication regimen   . Oxygen deficiency   . Pulmonary hypertension ([place])   . Second hand tobacco smoke exposure   . Sickle cell anemia (HCC)   . Sickle-cell crisis with associated acute chest syndrome ([place]) 05/13/2013  . Stroke ([place])   . Thrombocytosis ([place])  Chronic  . Uses marijuana     Social History  Substance Use Topics  . Smoking status: Former Smoker    Packs/day: 0.50    Years: 10.00    Types: Cigarettes    Quit date: 05/29/2011  . Smokeless tobacco: Never Used  . Alcohol use No    Family History  Problem Relation Age of Onset  . Sickle cell trait Mother   . Depression Mother   . Diabetes Mother   . Sickle cell trait Father   . Sickle cell trait Brother     No Known Allergies  Physical Exam: Constitutional: in no apparent distress and alert  Vitals:   08/09/16 1331 08/09/16 1630  BP: 119/74   Pulse:    Resp:  (!) 21  Temp:     EYES: anicteric ENMT:no thrush Cardiovascular: Cor  RRR Respiratory: CTA B; normal resipratory effort GI: Bowel sounds are normal, liver is not enlarged, spleen is not enlarged, soft, nt Musculoskeletal: no pedal edema noted Skin: negatives: no rash   Lab Results  Component Value Date   WBC 23.9 (H) 08/09/2016   HGB 7.9 (L) 08/09/2016   HCT 22.8 (L) 08/09/2016   MCV 86.7 08/09/2016   PLT 614 (H) 08/09/2016    Lab Results  Component Value Date   CREATININE 0.83 08/09/2016   BUN 17 08/08/2016   NA 138 08/08/2016   K 4.2 08/08/2016   CL 110 08/08/2016   CO2 22 08/08/2016    Lab Results  Component Value Date   ALT 26 08/08/2016   AST 35 08/08/2016   ALKPHOS 95 08/08/2016     Microbiology: Recent Results (from the past 240 hour(s))  Blood culture (routine x 2)     Status: Abnormal   Collection Time: 08/04/16  7:44 PM  Result Value Ref Range Status   Specimen Description BLOOD PORTA CATH  Final   Special Requests   Final    BOTTLES DRAWN AEROBIC AND ANAEROBIC Blood Culture adequate volume   Culture  Setup Time   Final    GRAM POSITIVE COCCI IN CLUSTERS IN BOTH AEROBIC AND ANAEROBIC BOTTLES CRITICAL RESULT CALLED TO, READ BACK BY AND VERIFIED WITH: A. ADKINS, RN [phone] 08/05/2016 T. TYSOR    Culture STAPHYLOCOCCUS EPIDERMIDIS (A)  Final   Report Status 08/08/2016 FINAL  Final   Organism ID, Bacteria STAPHYLOCOCCUS EPIDERMIDIS  Final      Susceptibility   Staphylococcus epidermidis - MIC*    CIPROFLOXACIN >=8 RESISTANT Resistant     ERYTHROMYCIN >=8 RESISTANT Resistant     GENTAMICIN <=0.5 SENSITIVE Sensitive     OXACILLIN >=4 RESISTANT Resistant     TETRACYCLINE 2 SENSITIVE Sensitive     VANCOMYCIN 1 SENSITIVE Sensitive     TRIMETH/SULFA <=10 SENSITIVE Sensitive     CLINDAMYCIN <=0.25 SENSITIVE Sensitive     RIFAMPIN <=0.5 SENSITIVE Sensitive     Inducible Clindamycin NEGATIVE Sensitive     * STAPHYLOCOCCUS EPIDERMIDIS  Blood Culture ID Panel (Reflexed)     Status: Abnormal   Collection Time: 08/04/16  7:44 PM   Result Value Ref Range Status   Enterococcus species NOT DETECTED NOT DETECTED Final   Listeria monocytogenes NOT DETECTED NOT DETECTED Final   Staphylococcus species DETECTED (A) NOT DETECTED Final    Comment: Methicillin (oxacillin) resistant coagulase negative staphylococcus. Possible blood culture contaminant (unless isolated from more than one blood culture draw or clinical case suggests pathogenicity). No antibiotic treatment is indicated for blood  culture contaminants. CRITICAL RESULT  CALLED TO, READ BACK BY AND VERIFIED WITH: A. ADKINS, RN [phone] 08/05/2016 T. TYSOR    Staphylococcus aureus NOT DETECTED NOT DETECTED Final   Methicillin resistance DETECTED (A) NOT DETECTED Final    Comment: CRITICAL RESULT CALLED TO, READ BACK BY AND VERIFIED WITH: A. ADKINS, RN 2140 08/05/2016 T. TYSOR    Streptococcus species NOT DETECTED NOT DETECTED Final   Streptococcus agalactiae NOT DETECTED NOT DETECTED Final   Streptococcus pneumoniae NOT DETECTED NOT DETECTED Final   Streptococcus pyogenes NOT DETECTED NOT DETECTED Final   Acinetobacter baumannii NOT DETECTED NOT DETECTED Final   Enterobacteriaceae species NOT DETECTED NOT DETECTED Final   Enterobacter cloacae complex NOT DETECTED NOT DETECTED Final   Escherichia coli NOT DETECTED NOT DETECTED Final   Klebsiella oxytoca NOT DETECTED NOT DETECTED Final   Klebsiella pneumoniae NOT DETECTED NOT DETECTED Final   Proteus species NOT DETECTED NOT DETECTED Final   Serratia marcescens NOT DETECTED NOT DETECTED Final   Haemophilus influenzae NOT DETECTED NOT DETECTED Final   Neisseria meningitidis NOT DETECTED NOT DETECTED Final   Pseudomonas aeruginosa NOT DETECTED NOT DETECTED Final   Candida albicans NOT DETECTED NOT DETECTED Final   Candida glabrata NOT DETECTED NOT DETECTED Final   Candida krusei NOT DETECTED NOT DETECTED Final   Candida parapsilosis NOT DETECTED NOT DETECTED Final   Candida tropicalis NOT DETECTED NOT DETECTED Final   Blood culture (routine x 2)     Status: Abnormal   Collection Time: 08/05/16 12:15 AM  Result Value Ref Range Status   Specimen Description BLOOD PORT  Final   Special Requests   Final    BOTTLES DRAWN AEROBIC AND ANAEROBIC Blood Culture adequate volume   Culture  Setup Time   Final    IN BOTH AEROBIC AND ANAEROBIC BOTTLES GRAM POSITIVE COCCI IN CLUSTERS CRITICAL RESULT CALLED TO, READ BACK BY AND VERIFIED WITH: PHARMD E JACKSON [phone] MLM    Culture (A)  Final    STAPHYLOCOCCUS EPIDERMIDIS SUSCEPTIBILITIES PERFORMED ON PREVIOUS CULTURE WITHIN THE LAST 5 DAYS. Performed at [facility], [place] [address] [place], [place] [address]    Report Status 08/09/2016 FINAL  Final  Blood Culture ID Panel (Reflexed)     Status: Abnormal   Collection Time: 08/05/16 12:15 AM  Result Value Ref Range Status   Enterococcus species NOT DETECTED NOT DETECTED Final   Listeria monocytogenes NOT DETECTED NOT DETECTED Final   Staphylococcus species DETECTED (A) NOT DETECTED Final    Comment: Methicillin (oxacillin) resistant coagulase negative staphylococcus. Possible blood culture contaminant (unless isolated from more than one blood culture draw or clinical case suggests pathogenicity). No antibiotic treatment is indicated for blood  culture contaminants. CRITICAL RESULT CALLED TO, READ BACK BY AND VERIFIED WITH: PHARMD E JACKSON [phone] MLM    Staphylococcus aureus NOT DETECTED NOT DETECTED Final   Methicillin resistance DETECTED (A) NOT DETECTED Final    Comment: CRITICAL RESULT CALLED TO, READ BACK BY AND VERIFIED WITH: PHARMD E JACKSON [phone] MLM    Streptococcus species NOT DETECTED NOT DETECTED Final   Streptococcus agalactiae NOT DETECTED NOT DETECTED Final   Streptococcus pneumoniae NOT DETECTED NOT DETECTED Final   Streptococcus pyogenes NOT DETECTED NOT DETECTED Final   Acinetobacter baumannii NOT DETECTED NOT DETECTED Final   Enterobacteriaceae species NOT DETECTED NOT  DETECTED Final   Enterobacter cloacae complex NOT DETECTED NOT DETECTED Final   Escherichia coli NOT DETECTED NOT DETECTED Final   Klebsiella oxytoca NOT DETECTED NOT DETECTED  Final   Klebsiella pneumoniae NOT DETECTED NOT DETECTED Final   Proteus species NOT DETECTED NOT DETECTED Final   Serratia marcescens NOT DETECTED NOT DETECTED Final   Haemophilus influenzae NOT DETECTED NOT DETECTED Final   Neisseria meningitidis NOT DETECTED NOT DETECTED Final   Pseudomonas aeruginosa NOT DETECTED NOT DETECTED Final   Candida albicans NOT DETECTED NOT DETECTED Final   Candida glabrata NOT DETECTED NOT DETECTED Final   Candida krusei NOT DETECTED NOT DETECTED Final   Candida parapsilosis NOT DETECTED NOT DETECTED Final   Candida tropicalis NOT DETECTED NOT DETECTED Final    Comment: Performed at [facility], [place] [address], [place], [place] [address]  Blood culture (routine x 2)     Status: Abnormal   Collection Time: 08/05/16 11:17 PM  Result Value Ref Range Status   Specimen Description BLOOD RIGHT HAND  Final   Special Requests   Final    BOTTLES DRAWN AEROBIC AND ANAEROBIC Blood Culture adequate volume   Culture  Setup Time   Final    GRAM POSITIVE COCCI IN CLUSTERS IN BOTH AEROBIC AND ANAEROBIC BOTTLES CRITICAL RESULT CALLED TO, READ BACK BY AND VERIFIED WITH: E JACKSON,PHARMD AT 1226 08/07/16 BY L BENFIELD    Culture (A)  Final    STAPHYLOCOCCUS EPIDERMIDIS SUSCEPTIBILITIES PERFORMED ON PREVIOUS CULTURE WITHIN THE LAST 5 DAYS. Performed at [facility], [place] [address], [place], [place] [address]    Report Status 08/09/2016 FINAL  Final  Urine culture     Status: None   Collection Time: 08/06/16 11:40 AM  Result Value Ref Range Status   Specimen Description URINE, RANDOM  Final   Special Requests NONE  Final   Culture   Final    NO GROWTH Performed at [facility], [place] [address] [place], [place] [address]    Report Status 08/07/2016 FINAL  Final    Scharlene Gloss, [facility] Infect[facility] [email or web address] [phone] pager  [phone] cell 08/09/2016, 6:38 PM

## 2016-08-09 NOTE — Progress Notes (Signed)
This encounter was created in error - please disregard.

## 2016-08-09 NOTE — Progress Notes (Signed)
Contact precautions discontinued per Infection prevention.

## 2016-08-09 NOTE — Progress Notes (Signed)
Pharmacy Antibiotic Note  [name] is a 36 y.o. male admitted to the ED on 08/05/2016 for positive blood cultures.  Vancomycin started on admission for GPC in clusters bacteremia.  Plan:  VT PM 7/10 is 22 mcg/ml  SCr daily  Change vancomycin to 1000 mg IV q12h  ______________________  Height: 6' (182.9 cm) Weight: 198 lb 6.6 oz (90 kg) IBW/kg (Calculated) : 77.6  Temp (24hrs), Avg:98.6 F (37 C), Min:97.2 F (36.2 C), Max:99.5 F (37.5 C)   Recent Labs Lab 08/04/16 2006 08/06/16 0014 08/06/16 0032 08/06/16 0435 08/07/16 0324 08/08/16 0334 08/09/16 0519 08/09/16 1046 08/09/16 2031  WBC 20.5* 25.0*  --  23.2*  --  18.5*  --  23.9*  --   CREATININE 0.74 2.77*  --  2.67* 1.59* 0.94 0.83  --   --   LATICACIDVEN  --   --  0.80  --   --   --   --   --   --   VANCOTROUGH  --   --   --   --   --   --   --   --  22*    Estimated Creatinine Clearance: 133.7 mL/min (by C-G formula based on SCr of 0.83 mg/dL).    No Known Allergies  Antimicrobials this admission:  7/7 vanc>>  Dose adjustments this admission:  7/8: inc vanc to 1250mg  q12h 7/9: inc vanc to 1g q8 hr 7/10 VT 22 mcg/ml: Decrease to 1000 mg IV q12h   Microbiology results:  7/5 bcx x1: 2/2 bottles MRSE - cx from port 7/6  BCx x2: 2/2 MRSE (4/4 bottles) - cx from port and peripheral 7/7 ucx: NGF  Thank you for allowing pharmacy to be a part of this patient's care.  Royetta Asal, PharmD, BCPS Pager [phone] 08/09/2016 9:22 PM

## 2016-08-10 LAB — TYPE AND SCREEN
ABO/RH(D): O POS
Antibody Screen: NEGATIVE
Unit division: 0

## 2016-08-10 LAB — CREATININE, SERUM
CREATININE: 0.88 mg/dL (ref 0.61–1.24)
GFR calc Af Amer: >60 mL/min (ref >60)

## 2016-08-10 LAB — BPAM RBC
BLOOD PRODUCT EXPIRATION DATE: 201808012359
ISSUE DATE / TIME: 201807100213
UNIT TYPE AND RH: 5100

## 2016-08-10 MED ORDER — HYDROMORPHONE 1 MG/ML IV SOLN
INTRAVENOUS | Status: DC
  Administered 2016-08-10 (×2): 1.6 mg via INTRAVENOUS
  Administered 2016-08-10: 2.8 mg via INTRAVENOUS
  Administered 2016-08-11: 0.8 mg via INTRAVENOUS
  Filled 2016-08-10: qty 25

## 2016-08-10 NOTE — Progress Notes (Signed)
    [facility] has been requested to perform a transesophageal echocardiogram on 08/11/2016 for Mitral Regurgitation.  After careful review of history and examination, the risks and benefits of transesophageal echocardiogram have been explained including risks of esophageal damage, perforation (1:10,000 risk), bleeding, pharyngeal hematoma as well as other potential complications associated with conscious sedation including aspiration, arrhythmia, respiratory failure and death. Alternatives to treatment were discussed, questions were answered. Patient is willing to proceed.   Linus Mako, PA-C 08/10/2016 1:07 PM

## 2016-08-10 NOTE — Progress Notes (Signed)
This shift received call from lab  Of Vanc trough of 22.mcg/ml. Advised by pharmacy not to hang Vancomycin this shift, dosage changes to be made. Will continue to monitor

## 2016-08-10 NOTE — Progress Notes (Signed)
SICKLE CELL SERVICE PROGRESS NOTE  [name] JSE:[id number] DOB: 10/10/1980 DOA: 08/05/2016 PCP: Tresa Garter, MD  Assessment/Plan: Active Problems:   Chronic respiratory failure with hypoxia ([place])   PAH (pulmonary artery hypertension) ([facility])   Sickle cell anemia ([place])   Bacteremia   H/O arterial ischemic stroke   PE (pulmonary thromboembolism) ([place])   ARF (acute renal failure) ([place])  1. MRSE: Continue Vancomycin. For a total of 14 days. He will need repeat cultures for TOC. If cultures remain negative the port can probably be salvaged. However if negative I would probably remove the port. Will defer to ID recommendations as they may have other suggestions to salvage port.  2. Mitral Regurgitation: Pt for TEE tomorrow and then Cardiology will make assessment and recommendations. 3. Hb SS with pain: It is unlikely that patient is in crisis as his percentage of Hb S  has been <50% pre-pheresis. Today it is at 30% which confirms a physiologic state of trait given the serial RBC pheresis Additionally he just received 1 unit of RBC's to keep Hb at recommended 8 g/dL in the setting of pulmonary arterial hypertension. Will schedule oral analgesics and decrease PCA dose. 4. Chronic Pain: Continue MS Contin. Pt needs consultation with Pain specialist as an out-patient. 5. Chronic Respiratory Failure with Hypoxia: Currently at baseline. Continue Oxygen supplementation.  6. 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  7. Acute Kidney Injury: Resolved.   Code Status: Full Code Family Communication: N/A Disposition Plan: Not yet ready for discharge  [place].  Pager [phone]. If 7PM-7AM, please contact night-coverage.  08/10/2016, 11:00 AM  LOS: 4 days   Interim History: Pt states that he feels well. Had a BM. No new c/o at this time. For TEE tomorrow.  Consultants:  Dr. Emmit Alexanders  Procedures:  None  Antibiotics:  Vancomycin 7/7  >>    Objective: Vitals:   08/10/16 0414 08/10/16 0424 08/10/16 0800 08/10/16 0815  BP:  110/68 101/77   Pulse:  83 83   Resp: (!) 21 (!) 24 (!) 28 (!) 22  Temp:  99.1 F (37.3 C) 99.9 F (37.7 C)   TempSrc:  Oral Oral   SpO2: 94% 94% 94% 94%  Weight:      Height:       Weight change:   Intake/Output Summary (Last 24 hours) at 08/10/16 1100 Last data filed at 08/10/16 1032  Gross per 24 hour  Intake              600 ml  Output             1700 ml  Net            -1100 ml      Physical Exam General: Alert, awake, oriented x3, in no acute distress.  HEENT: [place]/AT PEERL, EOMI, anicteric. 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 No rubs, gallops, PMI non-displaced, no heaves or thrills on palpation.  Lungs: Clear to auscultation, no wheezing or rhonchi noted. No increased vocal fremitus resonant to percussion  Abdomen: Soft, nontender, nondistended, positive bowel sounds, no masses no hepatosplenomegaly noted.  Neuro: No focal neurological deficits noted cranial nerves II through XII grossly intact.  Strength at baseline in bilateral upper and lower extremities. Musculoskeletal: No warmth swelling or erythema around joints, no spinal tenderness noted. Psychiatric: Patient alert and oriented x3, good insight and cognition, good recent to remote recall.  Data Reviewed: Basic Metabolic Panel:  Recent Labs Lab 08/04/16 0122 08/04/16 2006 08/06/16 0014 08/06/16 0435 08/07/16 0324 08/08/16 0334 08/09/16 0519 08/10/16 0308  NA 139 138 136 135  --  138  --   --   K 3.4* 3.2* 3.8 3.9  --  4.2  --   --   CL 105 108 107 106  --  110  --   --   CO2  --  22 22 22   --  22  --   --   GLUCOSE 108* 102* 135* 125*  --  111*  --   --   BUN 5* 8 12 14   --  17  --   --   CREATININE 0.60* 0.74 2.77* 2.67* 1.59* 0.94 0.83 0.88  CALCIUM  --  9.0 8.7* 8.4*  --  8.7*  --   --    Liver Function  Tests:  Recent Labs Lab 08/04/16 2006 08/08/16 0334  AST 29 35  ALT 26 26  ALKPHOS 94 95  BILITOT 3.9* 2.7*  PROT 8.1 7.4  ALBUMIN 4.0 3.9   No results for input(s): LIPASE, AMYLASE in the last 168 hours. No results for input(s): AMMONIA in the last 168 hours. CBC:  Recent Labs Lab 08/04/16 0101  08/04/16 2006 08/06/16 0014 08/06/16 0435 08/08/16 0334 08/09/16 1046  WBC 19.7*  --  20.5* 25.0* 23.2* 18.5* 23.9*  NEUTROABS 16.2*  --  16.5* 18.9*  --  13.7* 19.7*  HGB 8.0*  < > 7.9* 7.4* 7.0* 6.6* 7.9*  HCT 23.1*  < > 22.9* 21.6* 20.2* 19.2* 22.8*  MCV 87.5  --  87.4 88.9 88.6 86.1 86.7  PLT 555*  --  603* 630* 601* 569* 614*  < > = values in this interval not displayed. Cardiac Enzymes: No results for input(s): CKTOTAL, CKMB, CKMBINDEX, TROPONINI in the last 168 hours. BNP (last 3 results)  Recent Labs  05/16/16 1724 07/01/16 0535 07/26/16 0457  BNP 299.8* 539.3* 494.3*    ProBNP (last 3 results) No results for input(s): PROBNP in the last 8760 hours.  CBG: No results for input(s): GLUCAP in the last 168 hours.  Recent Results (from the past 240 hour(s))  Blood culture (routine x 2)     Status: Abnormal   Collection Time: 08/04/16  7:44 PM  Result Value Ref Range Status   Specimen Description BLOOD PORTA CATH  Final   Special Requests   Final    BOTTLES DRAWN AEROBIC AND ANAEROBIC Blood Culture adequate volume   Culture  Setup Time   Final    GRAM POSITIVE COCCI IN CLUSTERS IN BOTH AEROBIC AND ANAEROBIC BOTTLES CRITICAL RESULT CALLED TO, READ BACK BY AND VERIFIED WITH: A. ADKINS, RN [phone] 08/05/2016 T. TYSOR    Culture STAPHYLOCOCCUS EPIDERMIDIS (A)  Final   Report Status 08/08/2016 FINAL  Final   Organism ID, Bacteria STAPHYLOCOCCUS EPIDERMIDIS  Final      Susceptibility   Staphylococcus epidermidis - MIC*    CIPROFLOXACIN >=8 RESISTANT Resistant     ERYTHROMYCIN >=8 RESISTANT Resistant     GENTAMICIN <=0.5 SENSITIVE Sensitive     OXACILLIN >=4  RESISTANT Resistant     TETRACYCLINE 2 SENSITIVE Sensitive     VANCOMYCIN 1 SENSITIVE Sensitive     TRIMETH/SULFA <=10 SENSITIVE Sensitive     CLINDAMYCIN <=0.25 SENSITIVE Sensitive     RIFAMPIN <=0.5 SENSITIVE Sensitive     Inducible Clindamycin NEGATIVE Sensitive     * STAPHYLOCOCCUS EPIDERMIDIS  Blood  Culture ID Panel (Reflexed)     Status: Abnormal   Collection Time: 08/04/16  7:44 PM  Result Value Ref Range Status   Enterococcus species NOT DETECTED NOT DETECTED Final   Listeria monocytogenes NOT DETECTED NOT DETECTED Final   Staphylococcus species DETECTED (A) NOT DETECTED Final    Comment: Methicillin (oxacillin) resistant coagulase negative staphylococcus. Possible blood culture contaminant (unless isolated from more than one blood culture draw or clinical case suggests pathogenicity). No antibiotic treatment is indicated for blood  culture contaminants. CRITICAL RESULT CALLED TO, READ BACK BY AND VERIFIED WITH: A. ADKINS, RN [phone] 08/05/2016 T. TYSOR    Staphylococcus aureus NOT DETECTED NOT DETECTED Final   Methicillin resistance DETECTED (A) NOT DETECTED Final    Comment: CRITICAL RESULT CALLED TO, READ BACK BY AND VERIFIED WITH: A. ADKINS, RN 2140 08/05/2016 T. TYSOR    Streptococcus species NOT DETECTED NOT DETECTED Final   Streptococcus agalactiae NOT DETECTED NOT DETECTED Final   Streptococcus pneumoniae NOT DETECTED NOT DETECTED Final   Streptococcus pyogenes NOT DETECTED NOT DETECTED Final   Acinetobacter baumannii NOT DETECTED NOT DETECTED Final   Enterobacteriaceae species NOT DETECTED NOT DETECTED Final   Enterobacter cloacae complex NOT DETECTED NOT DETECTED Final   Escherichia coli NOT DETECTED NOT DETECTED Final   Klebsiella oxytoca NOT DETECTED NOT DETECTED Final   Klebsiella pneumoniae NOT DETECTED NOT DETECTED Final   Proteus species NOT DETECTED NOT DETECTED Final   Serratia marcescens NOT DETECTED NOT DETECTED Final   Haemophilus influenzae NOT DETECTED NOT  DETECTED Final   Neisseria meningitidis NOT DETECTED NOT DETECTED Final   Pseudomonas aeruginosa NOT DETECTED NOT DETECTED Final   Candida albicans NOT DETECTED NOT DETECTED Final   Candida glabrata NOT DETECTED NOT DETECTED Final   Candida krusei NOT DETECTED NOT DETECTED Final   Candida parapsilosis NOT DETECTED NOT DETECTED Final   Candida tropicalis NOT DETECTED NOT DETECTED Final  Blood culture (routine x 2)     Status: Abnormal   Collection Time: 08/05/16 12:15 AM  Result Value Ref Range Status   Specimen Description BLOOD PORT  Final   Special Requests   Final    BOTTLES DRAWN AEROBIC AND ANAEROBIC Blood Culture adequate volume   Culture  Setup Time   Final    IN BOTH AEROBIC AND ANAEROBIC BOTTLES GRAM POSITIVE COCCI IN CLUSTERS CRITICAL RESULT CALLED TO, READ BACK BY AND VERIFIED WITH: PHARMD E JACKSON [phone] MLM    Culture (A)  Final    STAPHYLOCOCCUS EPIDERMIDIS SUSCEPTIBILITIES PERFORMED ON PREVIOUS CULTURE WITHIN THE LAST 5 DAYS. Performed at [facility], [place] [address] [place], [place] [address]    Report Status 08/09/2016 FINAL  Final  Blood Culture ID Panel (Reflexed)     Status: Abnormal   Collection Time: 08/05/16 12:15 AM  Result Value Ref Range Status   Enterococcus species NOT DETECTED NOT DETECTED Final   Listeria monocytogenes NOT DETECTED NOT DETECTED Final   Staphylococcus species DETECTED (A) NOT DETECTED Final    Comment: Methicillin (oxacillin) resistant coagulase negative staphylococcus. Possible blood culture contaminant (unless isolated from more than one blood culture draw or clinical case suggests pathogenicity). No antibiotic treatment is indicated for blood  culture contaminants. CRITICAL RESULT CALLED TO, READ BACK BY AND VERIFIED WITH: PHARMD E JACKSON [phone] MLM    Staphylococcus aureus NOT DETECTED NOT DETECTED Final   Methicillin resistance DETECTED (A) NOT DETECTED Final    Comment: CRITICAL RESULT CALLED TO, READ BACK BY  AND VERIFIED WITH: Gustavo Lah [phone] MLM    Streptococcus species NOT DETECTED NOT DETECTED Final   Streptococcus agalactiae NOT DETECTED NOT DETECTED Final   Streptococcus pneumoniae NOT DETECTED NOT DETECTED Final   Streptococcus pyogenes NOT DETECTED NOT DETECTED Final   Acinetobacter baumannii NOT DETECTED NOT DETECTED Final   Enterobacteriaceae species NOT DETECTED NOT DETECTED Final   Enterobacter cloacae complex NOT DETECTED NOT DETECTED Final   Escherichia coli NOT DETECTED NOT DETECTED Final   Klebsiella oxytoca NOT DETECTED NOT DETECTED Final   Klebsiella pneumoniae NOT DETECTED NOT DETECTED Final   Proteus species NOT DETECTED NOT DETECTED Final   Serratia marcescens NOT DETECTED NOT DETECTED Final   Haemophilus influenzae NOT DETECTED NOT DETECTED Final   Neisseria meningitidis NOT DETECTED NOT DETECTED Final   Pseudomonas aeruginosa NOT DETECTED NOT DETECTED Final   Candida albicans NOT DETECTED NOT DETECTED Final   Candida glabrata NOT DETECTED NOT DETECTED Final   Candida krusei NOT DETECTED NOT DETECTED Final   Candida parapsilosis NOT DETECTED NOT DETECTED Final   Candida tropicalis NOT DETECTED NOT DETECTED Final    Comment: Performed at [facility], [place] [address] [place], [place] [address]  Blood culture (routine x 2)     Status: Abnormal   Collection Time: 08/05/16 11:17 PM  Result Value Ref Range Status   Specimen Description BLOOD RIGHT HAND  Final   Special Requests   Final    BOTTLES DRAWN AEROBIC AND ANAEROBIC Blood Culture adequate volume   Culture  Setup Time   Final    GRAM POSITIVE COCCI IN CLUSTERS IN BOTH AEROBIC AND ANAEROBIC BOTTLES CRITICAL RESULT CALLED TO, READ BACK BY AND VERIFIED WITH: E JACKSON,PHARMD AT 1226 08/07/16 BY L BENFIELD    Culture (A)  Final    STAPHYLOCOCCUS EPIDERMIDIS SUSCEPTIBILITIES PERFORMED ON PREVIOUS CULTURE WITHIN THE LAST 5 DAYS. Performed at [facility], [place] [address], [place], [place]  [address]    Report Status 08/09/2016 FINAL  Final  Urine culture     Status: None   Collection Time: 08/06/16 11:40 AM  Result Value Ref Range Status   Specimen Description URINE, RANDOM  Final   Special Requests NONE  Final   Culture   Final    NO GROWTH Performed at [facility], [place] [address] [place], [place] [address]    Report Status 08/07/2016 FINAL  Final     Studies: Dg Chest 2 View  Result Date: 08/05/2016 CLINICAL DATA:  Positive blood cultures.  Rib pain. EXAM: CHEST  2 VIEW COMPARISON:  Radiographs and CT 07/26/2016 FINDINGS: Left chest port tip in the mid SVC. Stable cardiomegaly. Mild central pulmonary edema. No pleural effusion. No focal airspace disease. No pneumothorax. Stable osseous structures. IMPRESSION: Stable cardiomegaly with mild pulmonary edema. No focal airspace disease. Electronically Signed   By: Jeb Levering M.D.   On: 08/05/2016 23:46   Dg Chest 2 View  Result Date: 07/20/2016 CLINICAL DATA:  36 year old male with sickle cell disease. Bilateral rib and leg pain. EXAM: CHEST  2 VIEW COMPARISON:  Chest CTA 07/18/2016 and earlier. FINDINGS: Stable cardiomegaly and mediastinal contours. Stable left subclavian Port-A-Cath, accessed. Stable to mildly improved lung volumes. No pneumothorax. Stable pulmonary vascularity without acute edema. No pleural effusion or confluent pulmonary opacity. Stable cholecystectomy clips. Negative visible bowel gas pattern. Stable visualized osseous structures, osteopenia and also H-type vertebral body changes better demonstrated on the recent CTA. IMPRESSION: Stable cardiomegaly. No acute cardiopulmonary  abnormality. Electronically Signed   By: Genevie Ann M.D.   On: 07/20/2016 16:47   Dg Chest 2 View  Result Date: 07/18/2016 CLINICAL DATA:  Bilateral chest and back pain, somnolence. Former smoker. History of sickle cell anemia, pulmonary embolus. EXAM: CHEST  2 VIEW COMPARISON:  Portable chest x-ray of July 14, 2016 FINDINGS: The  lungs are well-expanded. There is no focal infiltrate. No significant pleural effusion is visible. The cardiac silhouette remains enlarged. The pulmonary vascularity remains engorged. The pulmonary interstitial markings are mildly increased over baseline. The power port catheter tip projects over the cavoatrial junction. The mediastinum is normal in width. The bony thorax exhibits no acute abnormality. IMPRESSION: COPD and smoking related changes, stable. Mild pulmonary interstitial prominence bilaterally suggests low-grade CHF. Stable cardiomegaly. Electronically Signed   By: David  Martinique M.D.   On: 07/18/2016 13:05   Ct Angio Chest Pe W And/or Wo Contrast  Result Date: 07/26/2016 CLINICAL DATA:  35 y/o M; rib pain, abdominal pain, jaundice, history of sickle cell, and history of asplenia. EXAM: CT ANGIOGRAPHY CHEST WITH CONTRAST TECHNIQUE: Multidetector CT imaging of the chest was performed using the standard protocol during bolus administration of intravenous contrast. Multiplanar CT image reconstructions and MIPs were obtained to evaluate the vascular anatomy. CONTRAST:  100 cc Isovue 370 COMPARISON:  Concurrent CT angiogram of the abdomen and pelvis. 07/18/2016 CT angiogram of the chest. FINDINGS: Cardiovascular: Enlarged main pulmonary artery measuring 4.2 cm. Normal caliber thoracic aorta. Stable severe cardiomegaly. Left port catheter tip projects within the right atrium. No pericardial effusion. Multiple segmental acute pulmonary emboli within the lungs bilaterally best appreciated in the right lower lobe (series 7, image 151). Mediastinum/Nodes: No enlarged mediastinal, hilar, or axillary lymph nodes. Thyroid gland, trachea, and esophagus demonstrate no significant findings. Lungs/Pleura: Stable 6 mm nodule in the right middle lobe. Septal thickening of the lung bases and right perihilar ground-glass opacities probably represents mild pulmonary edema. No pleural effusion. Upper Abdomen: Calcified  atrophic spleen. Musculoskeletal: H shaped vertebral body compatible with bony stigmata of sickle cell disease. Review of the MIP images confirms the above findings. IMPRESSION: 1. Multiple segmental acute pulmonary emboli. 2. Stable severe cardiomegaly. 3. Enlarged main pulmonary artery indicates pulmonary artery hypertension. 4. Mild pulmonary edema. 5. Calcified atrophic spleen. These results were called by telephone at the time of interpretation on 07/26/2016 at 3:34 am to Dr. Abigail Butts , who verbally acknowledged these results. Electronically Signed   By: Kristine Garbe M.D.   On: 07/26/2016 03:36   Ct Angio Chest Pe W Or Wo Contrast  Addendum Date: 07/18/2016   ADDENDUM REPORT: 07/18/2016 19:21 ADDENDUM: Add to IMPRESSION: Suspect a degree of underlying pulmonary arterial hypertension given prominence in the main pulmonary outflow tract. Electronically Signed   By: Lowella Grip III M.D.   On: 07/18/2016 19:21   Result Date: 07/18/2016 CLINICAL DATA:  Chest pain.  History of sickle cell disease EXAM: CT ANGIOGRAPHY CHEST WITH CONTRAST TECHNIQUE: Multidetector CT imaging of the chest was performed using the standard protocol during bolus administration of intravenous contrast. Multiplanar CT image reconstructions and MIPs were obtained to evaluate the vascular anatomy. CONTRAST:  90 mL Isovue 370 nonionic COMPARISON:  Chest radiograph July 18, 2016; chest CT May 06, 2016 FINDINGS: Cardiovascular: There is a pulmonary embolus in a posterior segment right lower lobe pulmonary artery branch, similar to prior study. No more central pulmonary embolus evident. There is not felt to be of right heart strain. Heart is diffusely enlarged.  There is a small pericardial effusion. There is no thoracic aortic aneurysm or dissection. Port-A-Cath tip is at the cavoatrial junction. Main pulmonary outflow tract measures 3.8 cm in diameter consistent with pulmonary arterial hypertension.  Mediastinum/Nodes: Visualized thyroid appears normal. There are multiple mediastinal lymph nodes which appear similar to the previous study. There is not felt to be frank adenopathy by size criteria. Lungs/Pleura: There is bibasilar atelectasis. There is interstitial pulmonary edema. There is a stable nodular opacity in the right middle lobe seen on axial slice 51 series 11 measuring 3 mm. There is a stable nodular opacity in the lateral segment of the right middle lobe measuring 6 x 6 mm, ground-glass in appearance, best seen on axial slice 51 series 11. There is no appreciable pleural effusion or pleural thickening. Upper Abdomen: Spleen is small and calcified consistent with prior infarct from sickle cell disease. There is reflux of contrast into the inferior vena cava and hepatic veins. Visualized upper abdominal structures otherwise appear unremarkable. Musculoskeletal: There are endplate infarcts and multiple thoracic spine levels. Multiple areas of sclerosis noted consistent with sickle cell disease. No appreciable extramedullary hematopoiesis noted. Review of the MIP images confirms the above findings. IMPRESSION: Small pulmonary embolus in a right lower lobe pulmonary artery branch, also present on most recent study. Suspect small chronic pulmonary embolus focus. No new pulmonary embolus compared to most recent study. Findings felt to be indicative of a degree of congestive heart failure. Reflux of contrast into the hepatic veins and inferior vena cava may indicate increase in right heart pressure. Nodular opacities, largest measuring 6 x 6 mm, ground-glass in appearance. This nodular opacity noted in right middle lobe. Initial follow-up with CT at 6-12 months is recommended to confirm persistence. If persistent, repeat CT is recommended every 2 years until 5 years of stability has been established. This recommendation follows the consensus statement: Guidelines for Management of Incidental Pulmonary  Nodules Detected on CT Images: From the Fleischner Society 2017; Radiology 2017; 284:228-243. Multiple small lymph nodes, stable. Bony changes and splenic appearance consistent with known sickle cell disease. Critical Value/emergent results were called by telephone at the time of interpretation on 07/18/2016 at 4:18 pm to Dr. Jola Schmidt , who verbally acknowledged these results. Electronically Signed: By: Lowella Grip III M.D. On: 07/18/2016 16:19   Dg Chest Port 1 View  Result Date: 07/26/2016 CLINICAL DATA:  Awakened with chest pain. History of sickle cell disease. EXAM: PORTABLE CHEST 1 VIEW COMPARISON:  07/20/2016 FINDINGS: Unchanged prominent cardiac silhouette. The lungs are clear. No pleural effusions. No pneumothorax. Hilar and mediastinal contours are unremarkable and unchanged. Left-sided port appears intact, extending into the low SVC. IMPRESSION: Enlarged cardiac silhouette without significant interval change. No consolidation or effusion. Electronically Signed   By: Andreas Newport M.D.   On: 07/26/2016 01:52   Dg Chest Port 1 View  Result Date: 07/14/2016 CLINICAL DATA:  36 year old male with history of sickle cell disease presenting with bilateral chest pain, body pain and shortness of breath today. EXAM: PORTABLE CHEST 1 VIEW COMPARISON:  Chest x-ray 07/03/2016. FINDINGS: Mild blunting of the left costophrenic sulcus and obscuration of the lateral left hemidiaphragm suggesting a small left pleural effusion. No right pleural effusion. No definite consolidative airspace disease. Scattered areas of linear scarring in the lung bases, similar to prior studies. Cephalization of the pulmonary vasculature, without frank pulmonary edema. No pneumothorax. Heart size is mildly enlarged. Upper mediastinal contours are within normal limits. Left-sided subclavian single-lumen power porta cath  with tip terminating at the superior cavoatrial junction. IMPRESSION: 1. New small left pleural effusion.  2. Chronic scarring in the lung bases bilaterally. 3. Cardiomegaly with pulmonary venous congestion, but no frank pulmonary edema. Electronically Signed   By: Vinnie Langton M.D.   On: 07/14/2016 08:07   Ct Angio Abdomen Pelvis  W &/or Wo Contrast  Result Date: 07/26/2016 CLINICAL DATA:  Chest wall pain and abdominal pain. Sickle cell disease. EXAM: CTA ABDOMEN AND PELVIS wITHOUT AND WITH CONTRAST TECHNIQUE: Multidetector CT imaging of the abdomen and pelvis was performed using the standard protocol during bolus administration of intravenous contrast. Multiplanar reconstructed images and MIPs were obtained and reviewed to evaluate the vascular anatomy. CONTRAST:  150 mL Isovue 370 intravenous COMPARISON:  12/15/2015 FINDINGS: VASCULAR Aorta: Normal caliber with mild atherosclerotic calcification. No dissection or aneurysm. Celiac: Patent without evidence of aneurysm, dissection, vasculitis or significant stenosis. Small calcified spleen. Splenic artery is diminutive, possibly occluded. SMA: Patent without evidence of aneurysm, dissection, vasculitis or significant stenosis. Renals: Both renal arteries are patent without evidence of aneurysm, dissection, vasculitis, fibromuscular dysplasia or significant stenosis. IMA: Patent without evidence of aneurysm, dissection, vasculitis or significant stenosis. Inflow: Normal caliber and intact. Mild atherosclerotic calcification. Proximal Outflow: Bilateral common femoral and visualized portions of the superficial and profunda femoral arteries are patent without evidence of aneurysm, dissection, vasculitis or significant stenosis. Veins: No obvious venous abnormality within the limitations of this arterial phase study. Review of the MIP images confirms the above findings. NON-VASCULAR Hepatobiliary: Cholecystectomy.  No focal liver lesion. Pancreas: Unremarkable. No pancreatic ductal dilatation or surrounding inflammatory changes. Spleen: Small calcified spleen,  typical of sickle cell disease. Adrenals/Urinary Tract: Both adrenals are normal. Scattered renal parenchymal scars. Collecting systems and ureters are unremarkable. Urinary bladder is unremarkable. Stomach/Bowel: Stomach is within normal limits. Appendix appears normal. No evidence of bowel wall thickening, distention, or inflammatory changes. Lymphatic: No adenopathy in the abdomen or pelvis. Reproductive: Unremarkable Other: Umbilical hernia containing unobstructed small bowel. 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 No significant skeletal lesion. IMPRESSION: VASCULAR 1. Aortic atherosclerosis. 2. No aneurysm or dissection. Major branches of the aorta are also patent and normal in caliber. NON-VASCULAR 1. Small calcified spleen, typical of sickle cell disease. 2. Unobstructed small bowel within an umbilical hernia. 3. No acute findings are evident in the abdomen or pelvis. Electronically Signed   By: Andreas Newport M.D.   On: 07/26/2016 04:00    Scheduled Meds: . carvedilol  3.125 mg Oral BID WC  . cholecalciferol  2,000 Units Oral Daily  . folic acid  2 mg Oral Daily  . gabapentin  300 mg Oral TID  . HYDROmorphone   Intravenous Q4H  . hydroxyurea  1,000 mg Oral QODAY  . hydroxyurea  500 mg Oral QODAY  . L-glutamine  15 g Oral BID  . morphine  30 mg Oral Q12H  . Rivaroxaban  15 mg Oral BID WC  . [START ON 08/16/2016] rivaroxaban  20 mg Oral Q supper   Continuous Infusions: . diphenhydrAMINE (BENADRYL) IVPB(SICKLE CELL ONLY) 25 mg (08/06/16 2157)  . vancomycin Stopped (08/10/16 1031)    Active Problems:   Chronic respiratory failure with hypoxia (HCC)   PAH (pulmonary artery hypertension) (HCC)   Sickle cell anemia (HCC)   Bacteremia   H/O arterial ischemic stroke   PE (pulmonary thromboembolism) (HCC)   ARF (acute renal failure) (HCC)   In excess of 25 minutes spent during this visit. Greater than 50%  involved face to face contact with the  patient for assessment, counseling and coordination of care.

## 2016-08-10 NOTE — Care Management Important Message (Signed)
Important Message  Patient Details  Name: [name] MRN: [id number] Date of Birth: Apr 02, 1980   Medicare Important Message Given:  Yes    Kerin Salen 08/10/2016, 10:53 AMImportant Message  Patient Details  Name: [name] MRN: [id number] Date of Birth: [birth year]   Medicare Important Message Given:  Yes    Kerin Salen 08/10/2016, 10:53 AM

## 2016-08-11 ENCOUNTER — Inpatient Hospital Stay (HOSPITAL_COMMUNITY): Admit: 2016-08-11 | Discharge: 2016-08-11 | Disposition: A | Discharge status: Home / Self Care | Payer: Medicare Other

## 2016-08-11 ENCOUNTER — Encounter (HOSPITAL_COMMUNITY)
Admission: EM | Disposition: A | Discharge status: Other Acute Inpatient Hospital | Payer: Self-pay | Source: Home / Self Care | Attending: Internal Medicine

## 2016-08-11 ENCOUNTER — Inpatient Hospital Stay (HOSPITAL_COMMUNITY): Payer: Medicare Other | Admitting: Anesthesiology

## 2016-08-11 ENCOUNTER — Encounter (HOSPITAL_COMMUNITY): Payer: Self-pay | Admitting: *Deleted

## 2016-08-11 DIAGNOSIS — I058 Other rheumatic mitral valve diseases: Secondary | ICD-10-CM | POA: Diagnosis present

## 2016-08-11 DIAGNOSIS — I340 Nonrheumatic mitral (valve) insufficiency: Secondary | ICD-10-CM

## 2016-08-11 DIAGNOSIS — I079 Rheumatic tricuspid valve disease, unspecified: Secondary | ICD-10-CM | POA: Diagnosis present

## 2016-08-11 DIAGNOSIS — I368 Other nonrheumatic tricuspid valve disorders: Secondary | ICD-10-CM

## 2016-08-11 DIAGNOSIS — I059 Rheumatic mitral valve disease, unspecified: Secondary | ICD-10-CM | POA: Diagnosis present

## 2016-08-11 HISTORY — PX: TEE WITHOUT CARDIOVERSION: SHX5443

## 2016-08-11 LAB — CBC WITH DIFFERENTIAL/PLATELET
BASOS ABS: 0.1 10*3/uL (ref 0.0–0.1)
BASOS ABS: 0.1 10*3/uL (ref 0.0–0.1)
BASOS PCT: 0 %
BASOS PCT: 1 %
EOS PCT: 2 %
Eosinophils Absolute: 0.3 10*3/uL (ref 0.0–0.7)
Eosinophils Absolute: 0.2 10*3/uL (ref 0.0–0.7)
Eosinophils Relative: 2 %
HCT: 22.4 % — ABNORMAL LOW (ref 39.0–52.0)
HCT: 23.1 % — ABNORMAL LOW (ref 39.0–52.0)
Hemoglobin: 7.8 g/dL — ABNORMAL LOW (ref 13.0–17.0)
Hemoglobin: 7.7 g/dL — ABNORMAL LOW (ref 13.0–17.0)
Lymphocytes Relative: 6 %
Lymphocytes Relative: 7 %
Lymphs Abs: 1.1 10*3/uL (ref 0.7–4.0)
Lymphs Abs: 1 10*3/uL (ref 0.7–4.0)
MCH: 30.5 pg (ref 26.0–34.0)
MCH: 29.4 pg (ref 26.0–34.0)
MCHC: 34.8 g/dL (ref 30.0–36.0)
MCHC: 33.3 g/dL (ref 30.0–36.0)
MCV: 87.5 fL (ref 78.0–100.0)
MCV: 88.2 fL (ref 78.0–100.0)
MONO ABS: 2.5 10*3/uL — AB (ref 0.1–1.0)
MONO ABS: 2.1 10*3/uL — AB (ref 0.1–1.0)
Monocytes Relative: 15 %
Monocytes Relative: 16 %
NEUTROS ABS: 12.8 10*3/uL — AB (ref 1.7–7.7)
NEUTROS PCT: 77 %
Neutro Abs: 10.2 10*3/uL — ABNORMAL HIGH (ref 1.7–7.7)
Neutrophils Relative %: 74 %
PLATELETS: 568 10*3/uL — AB (ref 150–400)
PLATELETS: 528 10*3/uL — AB (ref 150–400)
RBC: 2.56 MIL/uL — AB (ref 4.22–5.81)
RBC: 2.62 MIL/uL — ABNORMAL LOW (ref 4.22–5.81)
RDW: 18.8 % — AB (ref 11.5–15.5)
RDW: 18.5 % — AB (ref 11.5–15.5)
WBC: 16.7 10*3/uL — AB (ref 4.0–10.5)
WBC: 13.6 10*3/uL — ABNORMAL HIGH (ref 4.0–10.5)

## 2016-08-11 LAB — RETICULOCYTES
RBC.: 2.56 MIL/uL — AB (ref 4.22–5.81)
RETIC COUNT ABSOLUTE: 235.5 10*3/uL — AB (ref 19.0–186.0)
Retic Ct Pct: 9.2 % — ABNORMAL HIGH (ref 0.4–3.1)

## 2016-08-11 LAB — BASIC METABOLIC PANEL
ANION GAP: 8 (ref 5–15)
BUN: 9 mg/dL (ref 6–20)
CALCIUM: 8.3 mg/dL — AB (ref 8.9–10.3)
CO2: 22 mmol/L (ref 22–32)
Chloride: 107 mmol/L (ref 101–111)
Creatinine, Ser: 0.88 mg/dL (ref 0.61–1.24)
GFR calc Af Amer: >60 mL/min (ref >60)
GLUCOSE: 126 mg/dL — AB (ref 65–99)
Potassium: 3.3 mmol/L — ABNORMAL LOW (ref 3.5–5.1)
Sodium: 137 mmol/L (ref 135–145)

## 2016-08-11 SURGERY — ECHOCARDIOGRAM, TRANSESOPHAGEAL
Anesthesia: Monitor Anesthesia Care

## 2016-08-11 MED ORDER — PROPOFOL 10 MG/ML IV BOLUS
INTRAVENOUS | Status: DC | PRN
  Administered 2016-08-11: 20 mg via INTRAVENOUS

## 2016-08-11 MED ORDER — SODIUM CHLORIDE 0.9 % IV SOLN: INTRAVENOUS | Status: DC

## 2016-08-11 MED ORDER — PROPOFOL 500 MG/50ML IV EMUL
INTRAVENOUS | Status: DC | PRN
  Administered 2016-08-11: 140 ug/kg/min via INTRAVENOUS

## 2016-08-11 MED ORDER — HYDROMORPHONE HCL 2 MG PO TABS
4.0000 mg | ORAL_TABLET | ORAL | Status: DC | PRN
  Administered 2016-08-11 – 2016-08-13 (×7): 4 mg via ORAL
  Filled 2016-08-11 (×7): qty 2

## 2016-08-11 MED ORDER — LACTATED RINGERS IV SOLN
INTRAVENOUS | Status: DC
  Administered 2016-08-11: 1000 mL via INTRAVENOUS

## 2016-08-11 MED ORDER — BUTAMBEN-TETRACAINE-BENZOCAINE 2-2-14 % EX AERO
INHALATION_SPRAY | CUTANEOUS | Status: DC | PRN
  Administered 2016-08-11: 2 via TOPICAL

## 2016-08-11 MED ORDER — POTASSIUM CHLORIDE CRYS ER 20 MEQ PO TBCR
40.0000 meq | EXTENDED_RELEASE_TABLET | ORAL | Status: AC
  Administered 2016-08-11: 40 meq via ORAL
  Filled 2016-08-11: qty 2

## 2016-08-11 MED ORDER — LACTATED RINGERS IV SOLN
INTRAVENOUS | Status: DC | PRN
  Administered 2016-08-11: 10:00:00 via INTRAVENOUS

## 2016-08-11 NOTE — Progress Notes (Signed)
SICKLE CELL SERVICE PROGRESS NOTE  [name] ZPH:[id number] DOB: Dec 09, 1980 DOA: 08/05/2016 PCP: Tresa Garter, MD  Assessment/Plan: Principal Problem:   Coagulase negative Staphylococcus bacteremia Active Problems:   Chronic respiratory failure with hypoxia (HCC)   PAH (pulmonary artery hypertension) (HCC)   Sickle cell anemia (HCC)   AKI (acute kidney injury) ([place])   H/O arterial ischemic stroke   PE (pulmonary thromboembolism) ([place])   Endocarditis of mitral valve   Endocarditis of tricuspid valve  1. MRSE: Continue Vancomycin. For a total of 14 days. He will need repeat cultures for TOC. If cultures remain negative the port can probably be salvaged. However if negative I would probably remove the port. Will defer to ID recommendations as they may have other suggestions to salvage port.  2. Mild Hypokalemia: Will replace orally. However patient leaving for TEE presently. Will address post procedure.  3. Mitral Regurgitation: Pt for TEE tomorrow and then Cardiology will make assessment and recommendations. 4. Hb SS with pain: It is unlikely that patient is in crisis as his percentage of Hb S  has been <50% pre-pheresis. Today it is at 30% which confirms a physiologic state of trait given the serial RBC pheresis Additionally he just received 1 unit of RBC's to keep Hb at recommended 8 g/dL in the setting of pulmonary arterial hypertension. Will schedule oral analgesics and decrease PCA dose. 5. Chronic Pain: Continue MS Contin. Pt needs consultation with Pain specialist as an out-patient. 6. Chronic Respiratory Failure with Hypoxia: Currently at baseline. Continue Oxygen supplementation.  7. 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  8. Acute Kidney Injury: Resolved.   Code Status: Full Code Family Communication: N/A Disposition Plan: Not yet ready for discharge  [place].  Pager [phone]. If 7PM-7AM, please contact  night-coverage.  08/11/2016, 3:28 PM  LOS: 5 days   Interim History: Pt expressed anxiety at anticipated findings from TEE. Assured pt that whatever the findings they will be thoughtfully addressed. Expected to return after procedure.   Consultants:  Dr. Emmit Alexanders  Cardiology - Dr. Stanford Breed  Procedures:  None  For TEE today  Antibiotics:  Vancomycin 7/7 >>    Objective:   Physical Exam  Vital Signs: Vital Signs reviewed and stable. General: Alert, awake, oriented x3, in no acute distress.  HEENT: [place]/AT PEERL, EOMI, anicteric. 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 No rubs, gallops, PMI non-displaced, no heaves or thrills on palpation.  Lungs: Clear to auscultation, no wheezing or rhonchi noted. No increased vocal fremitus resonant to percussion  Abdomen: Soft, nontender, nondistended, positive bowel sounds, no masses no hepatosplenomegaly noted.  Neuro: No focal neurological deficits noted cranial nerves II through XII grossly intact.  Strength at baseline in bilateral upper and lower extremities. Musculoskeletal: No warmth swelling or erythema around joints, no spinal tenderness noted. Psychiatric: Patient alert and oriented x3, good insight and cognition, good recent to remote recall.    Data Reviewed: Basic Metabolic Panel:  Recent Labs Lab 08/04/16 2006 08/06/16 0014 08/06/16 0435 08/07/16 0324 08/08/16 0334 08/09/16 0519 08/10/16 0308 08/11/16 0425  NA 138 136 135  --  138  --   --  137  K 3.2* 3.8 3.9  --  4.2  --   --  3.3*  CL 108 107 106  --  110  --   --  107  CO2 22 22 22   --  22  --   --  22  GLUCOSE 102* 135* 125*  --  111*  --   --  126*  BUN 8 12 14   --  17  --   --  9  CREATININE 0.74 2.77* 2.67* 1.59* 0.94 0.83 0.88 0.88  CALCIUM 9.0 8.7* 8.4*  --  8.7*  --   --  8.3*   Liver Function Tests:  Recent Labs Lab 08/04/16 2006 08/08/16 0334  AST 29 35   ALT 26 26  ALKPHOS 94 95  BILITOT 3.9* 2.7*  PROT 8.1 7.4  ALBUMIN 4.0 3.9   No results for input(s): LIPASE, AMYLASE in the last 168 hours. No results for input(s): AMMONIA in the last 168 hours. CBC:  Recent Labs Lab 08/04/16 2006 08/06/16 0014 08/06/16 0435 08/08/16 0334 08/09/16 1046 08/11/16 0425  WBC 20.5* 25.0* 23.2* 18.5* 23.9* 16.7*  NEUTROABS 16.5* 18.9*  --  13.7* 19.7* 12.8*  HGB 7.9* 7.4* 7.0* 6.6* 7.9* 7.8*  HCT 22.9* 21.6* 20.2* 19.2* 22.8* 22.4*  MCV 87.4 88.9 88.6 86.1 86.7 87.5  PLT 603* 630* 601* 569* 614* 568*   Cardiac Enzymes: No results for input(s): CKTOTAL, CKMB, CKMBINDEX, TROPONINI in the last 168 hours. BNP (last 3 results)  Recent Labs  05/16/16 1724 07/01/16 0535 07/26/16 0457  BNP 299.8* 539.3* 494.3*    ProBNP (last 3 results) No results for input(s): PROBNP in the last 8760 hours.  CBG: No results for input(s): GLUCAP in the last 168 hours.  Recent Results (from the past 240 hour(s))  Blood culture (routine x 2)     Status: Abnormal   Collection Time: 08/04/16  7:44 PM  Result Value Ref Range Status   Specimen Description BLOOD PORTA CATH  Final   Special Requests   Final    BOTTLES DRAWN AEROBIC AND ANAEROBIC Blood Culture adequate volume   Culture  Setup Time   Final    GRAM POSITIVE COCCI IN CLUSTERS IN BOTH AEROBIC AND ANAEROBIC BOTTLES CRITICAL RESULT CALLED TO, READ BACK BY AND VERIFIED WITH: A. ADKINS, RN [phone] 08/05/2016 T. TYSOR    Culture STAPHYLOCOCCUS EPIDERMIDIS (A)  Final   Report Status 08/08/2016 FINAL  Final   Organism ID, Bacteria STAPHYLOCOCCUS EPIDERMIDIS  Final      Susceptibility   Staphylococcus epidermidis - MIC*    CIPROFLOXACIN >=8 RESISTANT Resistant     ERYTHROMYCIN >=8 RESISTANT Resistant     GENTAMICIN <=0.5 SENSITIVE Sensitive     OXACILLIN >=4 RESISTANT Resistant     TETRACYCLINE 2 SENSITIVE Sensitive     VANCOMYCIN 1 SENSITIVE Sensitive     TRIMETH/SULFA <=10 SENSITIVE Sensitive      CLINDAMYCIN <=0.25 SENSITIVE Sensitive     RIFAMPIN <=0.5 SENSITIVE Sensitive     Inducible Clindamycin NEGATIVE Sensitive     * STAPHYLOCOCCUS EPIDERMIDIS  Blood Culture ID Panel (Reflexed)     Status: Abnormal   Collection Time: 08/04/16  7:44 PM  Result Value Ref Range Status   Enterococcus species NOT DETECTED NOT DETECTED Final   Listeria monocytogenes NOT DETECTED NOT DETECTED Final   Staphylococcus species DETECTED (A) NOT DETECTED Final    Comment: Methicillin (oxacillin) resistant coagulase negative staphylococcus. Possible blood culture contaminant (unless isolated from more than one blood culture draw or clinical case suggests pathogenicity). No antibiotic treatment is indicated for blood  culture contaminants. CRITICAL RESULT CALLED TO, READ BACK BY AND VERIFIED WITH: A. ADKINS, RN [phone] 08/05/2016 T. TYSOR    Staphylococcus aureus NOT DETECTED NOT DETECTED Final   Methicillin  resistance DETECTED (A) NOT DETECTED Final    Comment: CRITICAL RESULT CALLED TO, READ BACK BY AND VERIFIED WITH: A. ADKINS, RN 2140 08/05/2016 T. TYSOR    Streptococcus species NOT DETECTED NOT DETECTED Final   Streptococcus agalactiae NOT DETECTED NOT DETECTED Final   Streptococcus pneumoniae NOT DETECTED NOT DETECTED Final   Streptococcus pyogenes NOT DETECTED NOT DETECTED Final   Acinetobacter baumannii NOT DETECTED NOT DETECTED Final   Enterobacteriaceae species NOT DETECTED NOT DETECTED Final   Enterobacter cloacae complex NOT DETECTED NOT DETECTED Final   Escherichia coli NOT DETECTED NOT DETECTED Final   Klebsiella oxytoca NOT DETECTED NOT DETECTED Final   Klebsiella pneumoniae NOT DETECTED NOT DETECTED Final   Proteus species NOT DETECTED NOT DETECTED Final   Serratia marcescens NOT DETECTED NOT DETECTED Final   Haemophilus influenzae NOT DETECTED NOT DETECTED Final   Neisseria meningitidis NOT DETECTED NOT DETECTED Final   Pseudomonas aeruginosa NOT DETECTED NOT DETECTED Final   Candida  albicans NOT DETECTED NOT DETECTED Final   Candida glabrata NOT DETECTED NOT DETECTED Final   Candida krusei NOT DETECTED NOT DETECTED Final   Candida parapsilosis NOT DETECTED NOT DETECTED Final   Candida tropicalis NOT DETECTED NOT DETECTED Final  Blood culture (routine x 2)     Status: Abnormal   Collection Time: 08/05/16 12:15 AM  Result Value Ref Range Status   Specimen Description BLOOD PORT  Final   Special Requests   Final    BOTTLES DRAWN AEROBIC AND ANAEROBIC Blood Culture adequate volume   Culture  Setup Time   Final    IN BOTH AEROBIC AND ANAEROBIC BOTTLES GRAM POSITIVE COCCI IN CLUSTERS CRITICAL RESULT CALLED TO, READ BACK BY AND VERIFIED WITH: PHARMD E JACKSON [phone] MLM    Culture (A)  Final    STAPHYLOCOCCUS EPIDERMIDIS SUSCEPTIBILITIES PERFORMED ON PREVIOUS CULTURE WITHIN THE LAST 5 DAYS. Performed at [facility], [place] [address], [place],  [address]    Report Status 08/09/2016 FINAL  Final  Blood Culture ID Panel (Reflexed)     Status: Abnormal   Collection Time: 08/05/16 12:15 AM  Result Value Ref Range Status   Enterococcus species NOT DETECTED NOT DETECTED Final   Listeria monocytogenes NOT DETECTED NOT DETECTED Final   Staphylococcus species DETECTED (A) NOT DETECTED Final    Comment: Methicillin (oxacillin) resistant coagulase negative staphylococcus. Possible blood culture contaminant (unless isolated from more than one blood culture draw or clinical case suggests pathogenicity). No antibiotic treatment is indicated for blood  culture contaminants. CRITICAL RESULT CALLED TO, READ BACK BY AND VERIFIED WITH: PHARMD E JACKSON [phone] MLM    Staphylococcus aureus NOT DETECTED NOT DETECTED Final   Methicillin resistance DETECTED (A) NOT DETECTED Final    Comment: CRITICAL RESULT CALLED TO, READ BACK BY AND VERIFIED WITH: PHARMD E JACKSON [phone] MLM    Streptococcus species NOT DETECTED NOT DETECTED Final   Streptococcus agalactiae NOT  DETECTED NOT DETECTED Final   Streptococcus pneumoniae NOT DETECTED NOT DETECTED Final   Streptococcus pyogenes NOT DETECTED NOT DETECTED Final   Acinetobacter baumannii NOT DETECTED NOT DETECTED Final   Enterobacteriaceae species NOT DETECTED NOT DETECTED Final   Enterobacter cloacae complex NOT DETECTED NOT DETECTED Final   Escherichia coli NOT DETECTED NOT DETECTED Final   Klebsiella oxytoca NOT DETECTED NOT DETECTED Final   Klebsiella pneumoniae NOT DETECTED NOT DETECTED Final   Proteus species NOT DETECTED NOT DETECTED Final   Serratia marcescens NOT DETECTED NOT DETECTED Final  Haemophilus influenzae NOT DETECTED NOT DETECTED Final   Neisseria meningitidis NOT DETECTED NOT DETECTED Final   Pseudomonas aeruginosa NOT DETECTED NOT DETECTED Final   Candida albicans NOT DETECTED NOT DETECTED Final   Candida glabrata NOT DETECTED NOT DETECTED Final   Candida krusei NOT DETECTED NOT DETECTED Final   Candida parapsilosis NOT DETECTED NOT DETECTED Final   Candida tropicalis NOT DETECTED NOT DETECTED Final    Comment: Performed at [facility], [place] [address] [place], [place] [address]  Blood culture (routine x 2)     Status: Abnormal   Collection Time: 08/05/16 11:17 PM  Result Value Ref Range Status   Specimen Description BLOOD RIGHT HAND  Final   Special Requests   Final    BOTTLES DRAWN AEROBIC AND ANAEROBIC Blood Culture adequate volume   Culture  Setup Time   Final    GRAM POSITIVE COCCI IN CLUSTERS IN BOTH AEROBIC AND ANAEROBIC BOTTLES CRITICAL RESULT CALLED TO, READ BACK BY AND VERIFIED WITH: E JACKSON,PHARMD AT 1226 08/07/16 BY L BENFIELD    Culture (A)  Final    STAPHYLOCOCCUS EPIDERMIDIS SUSCEPTIBILITIES PERFORMED ON PREVIOUS CULTURE WITHIN THE LAST 5 DAYS. Performed at [facility], [place] [address], [place], [place] [address]    Report Status 08/09/2016 FINAL  Final  Urine culture     Status: None   Collection Time: 08/06/16 11:40 AM  Result Value Ref Range  Status   Specimen Description URINE, RANDOM  Final   Special Requests NONE  Final   Culture   Final    NO GROWTH Performed at [facility], [place] [address], [place], [place] [address]    Report Status 08/07/2016 FINAL  Final  Culture, blood (routine x 2)     Status: None (Preliminary result)   Collection Time: 08/10/16 12:16 AM  Result Value Ref Range Status   Specimen Description BLOOD BLOOD RIGHT HAND  Final   Special Requests IN PEDIATRIC BOTTLE Blood Culture adequate volume  Final   Culture   Final    NO GROWTH 1 DAY Performed at [facility], [place] [address], [place], [place] [address]    Report Status PENDING  Incomplete  Culture, blood (routine x 2)     Status: None (Preliminary result)   Collection Time: 08/10/16 12:20 AM  Result Value Ref Range Status   Specimen Description BLOOD BLOOD RIGHT HAND  Final   Special Requests IN PEDIATRIC BOTTLE Blood Culture adequate volume  Final   Culture   Final    NO GROWTH 1 DAY Performed at [facility], [place] [address], [place], [place] [address]    Report Status PENDING  Incomplete     Studies: Dg Chest 2 View  Result Date: 08/05/2016 CLINICAL DATA:  Positive blood cultures.  Rib pain. EXAM: CHEST  2 VIEW COMPARISON:  Radiographs and CT 07/26/2016 FINDINGS: Left chest port tip in the mid SVC. Stable cardiomegaly. Mild central pulmonary edema. No pleural effusion. No focal airspace disease. No pneumothorax. Stable osseous structures. IMPRESSION: Stable cardiomegaly with mild pulmonary edema. No focal airspace disease. Electronically Signed   By: Jeb Levering M.D.   On: 08/05/2016 23:46   Dg Chest 2 View  Result Date: 07/20/2016 CLINICAL DATA:  36 year old male with sickle cell disease. Bilateral rib and leg pain. EXAM: CHEST  2 VIEW COMPARISON:  Chest CTA 07/18/2016 and earlier. FINDINGS: Stable cardiomegaly and mediastinal contours. Stable left subclavian Port-A-Cath, accessed. Stable to mildly improved  lung  volumes. No pneumothorax. Stable pulmonary vascularity without acute edema. No pleural effusion or confluent pulmonary opacity. Stable cholecystectomy clips. Negative visible bowel gas pattern. Stable visualized osseous structures, osteopenia and also H-type vertebral body changes better demonstrated on the recent CTA. IMPRESSION: Stable cardiomegaly. No acute cardiopulmonary abnormality. Electronically Signed   By: Genevie Ann M.D.   On: 07/20/2016 16:47   Dg Chest 2 View  Result Date: 07/18/2016 CLINICAL DATA:  Bilateral chest and back pain, somnolence. Former smoker. History of sickle cell anemia, pulmonary embolus. EXAM: CHEST  2 VIEW COMPARISON:  Portable chest x-ray of July 14, 2016 FINDINGS: The lungs are well-expanded. There is no focal infiltrate. No significant pleural effusion is visible. The cardiac silhouette remains enlarged. The pulmonary vascularity remains engorged. The pulmonary interstitial markings are mildly increased over baseline. The power port catheter tip projects over the cavoatrial junction. The mediastinum is normal in width. The bony thorax exhibits no acute abnormality. IMPRESSION: COPD and smoking related changes, stable. Mild pulmonary interstitial prominence bilaterally suggests low-grade CHF. Stable cardiomegaly. Electronically Signed   By: David  Martinique M.D.   On: 07/18/2016 13:05   Ct Angio Chest Pe W And/or Wo Contrast  Result Date: 07/26/2016 CLINICAL DATA:  36 y/o M; rib pain, abdominal pain, jaundice, history of sickle cell, and history of asplenia. EXAM: CT ANGIOGRAPHY CHEST WITH CONTRAST TECHNIQUE: Multidetector CT imaging of the chest was performed using the standard protocol during bolus administration of intravenous contrast. Multiplanar CT image reconstructions and MIPs were obtained to evaluate the vascular anatomy. CONTRAST:  100 cc Isovue 370 COMPARISON:  Concurrent CT angiogram of the abdomen and pelvis. 07/18/2016 CT angiogram of the chest. FINDINGS:  Cardiovascular: Enlarged main pulmonary artery measuring 4.2 cm. Normal caliber thoracic aorta. Stable severe cardiomegaly. Left port catheter tip projects within the right atrium. No pericardial effusion. Multiple segmental acute pulmonary emboli within the lungs bilaterally best appreciated in the right lower lobe (series 7, image 151). Mediastinum/Nodes: No enlarged mediastinal, hilar, or axillary lymph nodes. Thyroid gland, trachea, and esophagus demonstrate no significant findings. Lungs/Pleura: Stable 6 mm nodule in the right middle lobe. Septal thickening of the lung bases and right perihilar ground-glass opacities probably represents mild pulmonary edema. No pleural effusion. Upper Abdomen: Calcified atrophic spleen. Musculoskeletal: H shaped vertebral body compatible with bony stigmata of sickle cell disease. Review of the MIP images confirms the above findings. IMPRESSION: 1. Multiple segmental acute pulmonary emboli. 2. Stable severe cardiomegaly. 3. Enlarged main pulmonary artery indicates pulmonary artery hypertension. 4. Mild pulmonary edema. 5. Calcified atrophic spleen. These results were called by telephone at the time of interpretation on 07/26/2016 at 3:34 am to Dr. Abigail Butts , who verbally acknowledged these results. Electronically Signed   By: Kristine Garbe M.D.   On: 07/26/2016 03:36   Ct Angio Chest Pe W Or Wo Contrast  Addendum Date: 07/18/2016   ADDENDUM REPORT: 07/18/2016 19:21 ADDENDUM: Add to IMPRESSION: Suspect a degree of underlying pulmonary arterial hypertension given prominence in the main pulmonary outflow tract. Electronically Signed   By: Lowella Grip III M.D.   On: 07/18/2016 19:21   Result Date: 07/18/2016 CLINICAL DATA:  Chest pain.  History of sickle cell disease EXAM: CT ANGIOGRAPHY CHEST WITH CONTRAST TECHNIQUE: Multidetector CT imaging of the chest was performed using the standard protocol during bolus administration of intravenous contrast.  Multiplanar CT image reconstructions and MIPs were obtained to evaluate the vascular anatomy. CONTRAST:  90 mL Isovue 370 nonionic COMPARISON:  Chest radiograph  July 18, 2016; chest CT May 06, 2016 FINDINGS: Cardiovascular: There is a pulmonary embolus in a posterior segment right lower lobe pulmonary artery branch, similar to prior study. No more central pulmonary embolus evident. There is not felt to be of right heart strain. Heart is diffusely enlarged. There is a small pericardial effusion. There is no thoracic aortic aneurysm or dissection. Port-A-Cath tip is at the cavoatrial junction. Main pulmonary outflow tract measures 3.8 cm in diameter consistent with pulmonary arterial hypertension. Mediastinum/Nodes: Visualized thyroid appears normal. There are multiple mediastinal lymph nodes which appear similar to the previous study. There is not felt to be frank adenopathy by size criteria. Lungs/Pleura: There is bibasilar atelectasis. There is interstitial pulmonary edema. There is a stable nodular opacity in the right middle lobe seen on axial slice 51 series 11 measuring 3 mm. There is a stable nodular opacity in the lateral segment of the right middle lobe measuring 6 x 6 mm, ground-glass in appearance, best seen on axial slice 51 series 11. There is no appreciable pleural effusion or pleural thickening. Upper Abdomen: Spleen is small and calcified consistent with prior infarct from sickle cell disease. There is reflux of contrast into the inferior vena cava and hepatic veins. Visualized upper abdominal structures otherwise appear unremarkable. Musculoskeletal: There are endplate infarcts and multiple thoracic spine levels. Multiple areas of sclerosis noted consistent with sickle cell disease. No appreciable extramedullary hematopoiesis noted. Review of the MIP images confirms the above findings. IMPRESSION: Small pulmonary embolus in a right lower lobe pulmonary artery branch, also present on most recent  study. Suspect small chronic pulmonary embolus focus. No new pulmonary embolus compared to most recent study. Findings felt to be indicative of a degree of congestive heart failure. Reflux of contrast into the hepatic veins and inferior vena cava may indicate increase in right heart pressure. Nodular opacities, largest measuring 6 x 6 mm, ground-glass in appearance. This nodular opacity noted in right middle lobe. Initial follow-up with CT at 6-12 months is recommended to confirm persistence. If persistent, repeat CT is recommended every 2 years until 5 years of stability has been established. This recommendation follows the consensus statement: Guidelines for Management of Incidental Pulmonary Nodules Detected on CT Images: From the Fleischner Society 2017; Radiology 2017; 284:228-243. Multiple small lymph nodes, stable. Bony changes and splenic appearance consistent with known sickle cell disease. Critical Value/emergent results were called by telephone at the time of interpretation on 07/18/2016 at 4:18 pm to Dr. Jola Schmidt , who verbally acknowledged these results. Electronically Signed: By: Lowella Grip III M.D. On: 07/18/2016 16:19   Dg Chest Port 1 View  Result Date: 07/26/2016 CLINICAL DATA:  Awakened with chest pain. History of sickle cell disease. EXAM: PORTABLE CHEST 1 VIEW COMPARISON:  07/20/2016 FINDINGS: Unchanged prominent cardiac silhouette. The lungs are clear. No pleural effusions. No pneumothorax. Hilar and mediastinal contours are unremarkable and unchanged. Left-sided port appears intact, extending into the low SVC. IMPRESSION: Enlarged cardiac silhouette without significant interval change. No consolidation or effusion. Electronically Signed   By: Andreas Newport M.D.   On: 07/26/2016 01:52   Dg Chest Port 1 View  Result Date: 07/14/2016 CLINICAL DATA:  36 year old male with history of sickle cell disease presenting with bilateral chest pain, body pain and shortness of breath  today. EXAM: PORTABLE CHEST 1 VIEW COMPARISON:  Chest x-ray 07/03/2016. FINDINGS: Mild blunting of the left costophrenic sulcus and obscuration of the lateral left hemidiaphragm suggesting a small left pleural effusion. No right  pleural effusion. No definite consolidative airspace disease. Scattered areas of linear scarring in the lung bases, similar to prior studies. Cephalization of the pulmonary vasculature, without frank pulmonary edema. No pneumothorax. Heart size is mildly enlarged. Upper mediastinal contours are within normal limits. Left-sided subclavian single-lumen power porta cath with tip terminating at the superior cavoatrial junction. IMPRESSION: 1. New small left pleural effusion. 2. Chronic scarring in the lung bases bilaterally. 3. Cardiomegaly with pulmonary venous congestion, but no frank pulmonary edema. Electronically Signed   By: Vinnie Langton M.D.   On: 07/14/2016 08:07   Ct Angio Abdomen Pelvis  W &/or Wo Contrast  Result Date: 07/26/2016 CLINICAL DATA:  Chest wall pain and abdominal pain. Sickle cell disease. EXAM: CTA ABDOMEN AND PELVIS wITHOUT AND WITH CONTRAST TECHNIQUE: Multidetector CT imaging of the abdomen and pelvis was performed using the standard protocol during bolus administration of intravenous contrast. Multiplanar reconstructed images and MIPs were obtained and reviewed to evaluate the vascular anatomy. CONTRAST:  150 mL Isovue 370 intravenous COMPARISON:  12/15/2015 FINDINGS: VASCULAR Aorta: Normal caliber with mild atherosclerotic calcification. No dissection or aneurysm. Celiac: Patent without evidence of aneurysm, dissection, vasculitis or significant stenosis. Small calcified spleen. Splenic artery is diminutive, possibly occluded. SMA: Patent without evidence of aneurysm, dissection, vasculitis or significant stenosis. Renals: Both renal arteries are patent without evidence of aneurysm, dissection, vasculitis, fibromuscular dysplasia or significant stenosis. IMA:  Patent without evidence of aneurysm, dissection, vasculitis or significant stenosis. Inflow: Normal caliber and intact. Mild atherosclerotic calcification. Proximal Outflow: Bilateral common femoral and visualized portions of the superficial and profunda femoral arteries are patent without evidence of aneurysm, dissection, vasculitis or significant stenosis. Veins: No obvious venous abnormality within the limitations of this arterial phase study. Review of the MIP images confirms the above findings. NON-VASCULAR Hepatobiliary: Cholecystectomy.  No focal liver lesion. Pancreas: Unremarkable. No pancreatic ductal dilatation or surrounding inflammatory changes. Spleen: Small calcified spleen, typical of sickle cell disease. Adrenals/Urinary Tract: Both adrenals are normal. Scattered renal parenchymal scars. Collecting systems and ureters are unremarkable. Urinary bladder is unremarkable. Stomach/Bowel: Stomach is within normal limits. Appendix appears normal. No evidence of bowel wall thickening, distention, or inflammatory changes. Lymphatic: No adenopathy in the abdomen or pelvis. Reproductive: Unremarkable Other: Umbilical hernia containing unobstructed small bowel. 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 No significant skeletal lesion. IMPRESSION: VASCULAR 1. Aortic atherosclerosis. 2. No aneurysm or dissection. Major branches of the aorta are also patent and normal in caliber. NON-VASCULAR 1. Small calcified spleen, typical of sickle cell disease. 2. Unobstructed small bowel within an umbilical hernia. 3. No acute findings are evident in the abdomen or pelvis. Electronically Signed   By: Andreas Newport M.D.   On: 07/26/2016 04:00    Scheduled Meds: . carvedilol  3.125 mg Oral BID WC  . cholecalciferol  2,000 Units Oral Daily  . folic acid  2 mg Oral Daily  . gabapentin  300 mg Oral TID  . hydroxyurea  1,000 mg Oral QODAY  . hydroxyurea  500 mg Oral QODAY  .  L-glutamine  15 g Oral BID  . morphine  30 mg Oral Q12H  . Rivaroxaban  15 mg Oral BID WC  . [START ON 08/16/2016] rivaroxaban  20 mg Oral Q supper   Continuous Infusions: . diphenhydrAMINE (BENADRYL) IVPB(SICKLE CELL ONLY) 25 mg (08/06/16 2157)  . lactated ringers 1,000 mL (08/11/16 0950)  . vancomycin Stopped (08/11/16 0935)    Principal Problem:   Coagulase negative Staphylococcus bacteremia Active  Problems:   Chronic respiratory failure with hypoxia (HCC)   PAH (pulmonary artery hypertension) (HCC)   Sickle cell anemia (HCC)   AKI (acute kidney injury) ([place])   H/O arterial ischemic stroke   PE (pulmonary thromboembolism) ([place])   Endocarditis of mitral valve   Endocarditis of tricuspid valve   In excess of 25 minutes spent during this visit. Greater than 50% involved face to face contact with the patient for assessment, counseling and coordination of care.

## 2016-08-11 NOTE — Progress Notes (Signed)
Patient admitted to 5W25 in stable condition. Alert and oriented. No complaints of pain or discomfort at this time. Call light within reach.

## 2016-08-11 NOTE — Progress Notes (Signed)
PCA D/c'd per physician order prior to transport to [facility] for TEE. 55mL dilaudid was wasted with witness Rayetta Humphrey.

## 2016-08-11 NOTE — Transfer of Care (Signed)
Immediate Anesthesia Transfer of Care Note  Patient: [name]  Procedure(s) Performed: Procedure(s): TRANSESOPHAGEAL ECHOCARDIOGRAM (TEE) (N/A)  Patient Location: Endoscopy Unit  Anesthesia Type:MAC  Level of Consciousness: awake, alert , oriented and patient cooperative  Airway & Oxygen Therapy: Patient Spontanous Breathing and Patient connected to nasal cannula oxygen  Post-op Assessment: Report given to RN and Post -op Vital signs reviewed and stable  Post vital signs: Reviewed and stable  Last Vitals:  Vitals:   08/11/16 0922 08/11/16 1039  BP: 111/69 (!) 87/47  Pulse:  84  Resp: 18 (!) 31  Temp: 37.4 C     Last Pain:  Vitals:   08/11/16 1039  TempSrc: Oral  PainSc:       Patients Stated Pain Goal: 2 (39/76/73 4193)  Complications: No apparent anesthesia complications

## 2016-08-11 NOTE — Progress Notes (Signed)
Patient ID: [name], male   DOB: 27-Sep-1980, 36 y.o.   MRN: [id number]          [name] for Infectious Disease    Date of Admission:  08/05/2016   Day 6 vancomycin         Patient Active Problem List   Diagnosis Date Noted  . Endocarditis of mitral valve 08/11/2016    Priority: High  . Endocarditis of tricuspid valve 08/11/2016    Priority: High  . Coagulase negative Staphylococcus bacteremia 08/05/2016    Priority: High  . PE (pulmonary thromboembolism) ([place]) 07/26/2016    Priority: High  . Sickle cell anemia (HCC) 06/25/2014    Priority: Medium  . H/O arterial ischemic stroke 11/11/2015  . AKI (acute kidney injury) ([place])   . Prolonged Q-T interval on ECG 09/10/2014  . Cor pulmonale, chronic ([place]) 08/25/2014  . Anemia of chronic disease 06/25/2014  . PAH (pulmonary artery hypertension) ([place]) 03/18/2014  . Chronic respiratory failure with hypoxia ([place]) 03/14/2014  . Paralytic strabismus, external ophthalmoplegia   . Chronic pain syndrome 12/12/2013  . Chronic anticoagulation 08/22/2013  . Essential hypertension 08/22/2013  . Pulmonary HTN ([place]) 06/18/2013  . Functional asplenia   . Vitamin D deficiency 02/13/2013  . 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 He has had multiple admissions to the hospital for pain crises. He has a chronic Port-A-Cath for transfusions. He was recently admitted to the hospital on 07/26/2016 with acute bilateral chest pain. He was found to have acute bilateral pulmonary emboli. He said that he had been off his rivaroxaban for 3 days . He had fever up to 101.2 on 07/27/2016. Blood cultures were drawn and were negative. Rivaroxaban was restarted and he was discharged the following day.  He was admitted at [facility] on 07/30/2016 with persistent chest pain. One blood culture there on 07/30/2016 grew coagulase-negative staph.  Another one was drawn on 08/02/2016 but did not turn positive with coagulase-negative staph again until he was here in the emergency department on 08/04/2016. He was admitted here that day and blood cultures were persistently positive for methicillin-resistant coagulase-negative staph on 08/04/2016 and 08/05/2016. He was seen yesterday by my partner, Dr. Talbot Grumbling, who recommended continuing vancomycin.   He was transferred here today for transesophageal echocardiogram which showed a large mobile density measuring 2.6 x 1.6 cm on the anterior mitral valve leaflet, mild mitral regurgitation, possible perforation of the septal tricuspid valve leaflet and severe tricuspid regurgitation. He has had some low-grade fevers up to 100.1 during this admission. Repeat blood cultures obtained yesterday are negative so far. I strongly suspect that his recent pulmonary emboli were septic pulmonary emboli from his tricuspid valve. I will continue vancomycin. Although current guidelines suggest it is often possible to salvage a Port-A-Cath when the bacteremia is due to coagulase-negative staph. Given how complicated this is and the fact that he was bacteremic over at least a week's period of time I would give strong consideration to removing the Port-A-Cath soon. I will follow with you while he is here.       Michel Bickers, MD [facility] [place] [facility] [phone] pager   [phone] cell 08/11/2016, 3:13 PM

## 2016-08-11 NOTE — Progress Notes (Signed)
Patient ID: [name], male   DOB: [birth year], 36 y.o.   MRN: [id number] Spoke with Dr. Stanford Breed and Dr. Acie Fredrickson both of whom recommend surgical intervention for the large vegetation found on TEE. I have discussed the case with his Hematologist with regard to his Hematological needs during a C-V Surgery and he agrees that RBC Pheresis will be a likely need. I will also speak with ID about the plan now with the new findings.   I will call to try to arrange transfer to [facility] for tomorrow.  [name],[name]

## 2016-08-11 NOTE — Progress Notes (Signed)
Pt will stay at [facility]. Will arrange to have belongings taken to new room, 5W25. RN on 5W at [facility] provided with report.

## 2016-08-11 NOTE — Consult Note (Signed)
Cardiology Consultation:   Patient ID: [name]; [id number]; 1980/02/18   Admit date: 08/05/2016 Date of Consult: 08/11/2016  Primary Care Provider: Tresa Garter, MD Primary Cardiologist: New Primary Electrophysiologist:  None   Patient Profile:   [name] is a 36 y.o. male with a hx sickle cell disease with hx of multiple related complications such as acute chest syndrome, pulmonary embolism, right internal jugular embolism, avascular necrosis or R hip, hx of demand ischemia, former smoker, hx of c.diff, chronic and stroke  The patient is being seen today for the evaluation of mitral valve vegitation at the request of Dr. Zigmund Daniel.  History of Present Illness:   [name] has a long and complex past medical history. [name] is currently inpatient (admitted on (7/6)for chronic respiratory failure with hypoxia, pulmonary artery hypertension, sickle cell anemia, bacteremia, and acute renal failure. [name] presented to the emergency department as recommended for positive blood cultures that grew MRSE. [name] is afebrile and non toxic appearing on arrival. A bacteremia work-up was done including cardiac echo, this showed. It is believed the infection likely came from his port. [name] has been seen a few times before this presentation and left AMA despite concern for bacteremia.   His 2D-echo (08/07/16) showed EF 60-65%, right ventricle severely dilated, systolic function reduced. Severe tricuspid regurg. 2-D echo (07/26/2016) showed severe posteriorly directed Mitral Regurgitation. Dr. Stanford Breed performed a TEE on patient today a large 2.6 x 1.6 cm large mobile density on anterior MV leaflet concerning for vegetation, mild MR; severe TR; possible perforation of septal TV leaflet and also malcoaptation.  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 [name] has required 1  unit RBCs already this admission and is on IV Vancomycin.    Past Medical History:  Diagnosis Date  . Acute chest syndrome ([place]) 06/18/2013  . Acute embolism and thrombosis of right internal jugular vein ([place])   . Alcohol consumption of one to four drinks per day   . Avascular necrosis (HCC)    Right Hip  . Blood transfusion   . Chronic anticoagulation   . Demand ischemia ([place]) 01/02/2014  . Former smoker   . Functional asplenia   . Hb-SS disease with crisis ([place])   . History of Clostridium difficile infection   . History of pulmonary embolus (PE)   . Hypertension   . Hypokalemia   . Leukocytosis    Chronic  . Mood disorder ([place])   . Noncompliance with medication regimen   . Oxygen deficiency   . Pulmonary hypertension ([place])   . Second hand tobacco smoke exposure   . Sickle cell anemia (HCC)   . Sickle-cell crisis with associated acute chest syndrome ([place]) 05/13/2013  . Stroke ([place])   . Thrombocytosis (HCC)    Chronic  . Uses marijuana     Past Surgical History:  Procedure Laterality Date  . CHOLECYSTECTOMY     01/2008  . Excision of left periauricular cyst     10/2009  . Excision of right ear lobe cyst with primary closur     11/2007  . Porta cath placement    . Porta cath removal    . PORTACATH PLACEMENT  01/05/2012   Procedure: INSERTION PORT-A-CATH;  Surgeon: Odis Hollingshead, MD;  Location: [facility];  Service: General;  Laterality: N/A;  ultrasound guiced port a cath  insertion with fluoroscopy  . Right hip replacement     08/2006  . UMBILICAL HERNIA REPAIR     01/2008     Inpatient Medications: Scheduled Meds: . carvedilol  3.125 mg Oral BID WC  . cholecalciferol  2,000 Units Oral Daily  . folic acid  2 mg Oral Daily  . gabapentin  300 mg Oral TID  . hydroxyurea  1,000 mg Oral QODAY  . hydroxyurea  500 mg Oral QODAY  . L-glutamine  15 g Oral BID  . morphine  30 mg Oral Q12H  . Rivaroxaban  15 mg Oral BID WC  . [START ON 08/16/2016] rivaroxaban  20 mg Oral  Q supper   Continuous Infusions: . diphenhydrAMINE (BENADRYL) IVPB(SICKLE CELL ONLY) 25 mg (08/06/16 2157)  . lactated ringers 1,000 mL (08/11/16 0950)  . vancomycin Stopped (08/11/16 0935)   PRN Meds: acetaminophen **OR** acetaminophen, diphenhydrAMINE **OR** diphenhydrAMINE (BENADRYL) IVPB(SICKLE CELL ONLY), sodium chloride flush, zolpidem  Allergies:   No Known Allergies  Social History:   Social History   Social History  . Marital status: Single    Spouse name: N/A  . Number of children: 0  . Years of education: 76   Occupational History  . 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 Smoking status: Former Smoker    Packs/day: 0.50    Years: 10.00    Types: Cigarettes    Quit date: 05/29/2011  . Smokeless tobacco: Never Used  . Alcohol use No  . Drug use: No     Comment: Marijuana weekly  . Sexual activity: Yes    Partners: Female    Birth control/ protection: Condom     Comment: month ago   Other Topics Concern  . Not on file   Social History Narrative   Lives in an apartment.  Single.  Lives alone but has a girlfriend that helps care for him.  Does not use any assist devices.        [name]:  [phone] Mom, emergency contact      [facility]:   Patient continuing to live in her apartment in town alone. 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      Family History:   The patient's family history includes Depression in his mother; Diabetes in his mother; Sickle cell trait in his brother, father, and mother.  ROS:  Please see the history of present illness.  All other ROS reviewed and negative.     Physical Exam/Data:   Vitals:   08/11/16 1110 08/11/16 1120 08/11/16 1130 08/11/16 1300  BP: 112/63 104/67 108/65 109/69  Pulse: 77 78 77 76  Resp: 20 (!) 22 (!) 22 (!) 21  Temp:    98.4 F (36.9 C)  TempSrc:    Oral  SpO2: 95% 95% 95% 98%  Weight:    193 lb 4.8 oz (87.7 kg)   Height:    6' (1.829 m)    Intake/Output Summary (Last 24 hours) at 08/11/16 1410 Last data filed at 08/11/16 1028  Gross per 24 hour  Intake             1471 ml  Output             1851 ml  Net             -380 ml   Filed Weights   08/11/16 0500 08/11/16 0922 08/11/16 1300  Weight: 200 lb 9.9 oz (91 kg) 200 lb (90.7 kg) 193 lb 4.8 oz (87.7 kg)   Body mass index is 26.22 kg/m.  General: Well developed, well nourished Head: Normocephalic, atraumatic, Neck: Negative for carotid bruits Lungs:. Breathing is unlabored. Heart: RRR with S1 S2. + late systolic murmur Abdomen: Soft, non-tender, non-distended  Msk:  Strength and tone appear normal for age. Extremities: No clubbing or cyanosis. No edema. Neuro: Alert and oriented X 3. No facial asymmetry.  Psych:  Responds to questions appropriately with a normal affect.  EKG:  The EKG was personally reviewed and demonstrates sinus tachycardia rates 114, changes of LVH and RVH.  Relevant CV Studies:  TEE 08/11/2016 Normal LV function; mild LAE; moderate RVE with severely reduced function; moderate RAE; trace AI; large mobile density (2.6 x 1.6 cm) on anterior MV leaflet concerning for vegetation; mild MR; severe TR; possible perforation of septal TV leaflet and also malcoaptation.  ECHOCARDIOGRAM 08/07/2016 Study Conclusions  - Left ventricle: The cavity size was normal. Wall thickness was   increased in a pattern of mild LVH. Systolic function was normal.   The estimated ejection fraction was in the range of 60% to 65%. - Ventricular septum: The contour showed diastolic flattening and   systolic flattening. These changes are consistent with RV volume   and pressure overload. - Left atrium: The atrium was moderately dilated. - Right ventricle: The cavity size was severely dilated. Systolic   function was reduced. - Right atrium: The atrium was dilated. - Tricuspid valve: There was severe regurgitation directed   eccentrically. -  Pulmonary arteries: PA peak pressure: 55 mm Hg (S). - Systemic veins: IVC is dilated with normal respiratory variation.   Estimated RAP 8 mmHg. - Pericardium, extracardiac: A trivial pericardial effusion was   identified.  Impressions:  - No obvious vegetations noted.  Laboratory Data:  Chemistry Recent Labs Lab 08/06/16 0435  08/08/16 0334 08/09/16 0519 08/10/16 0308 08/11/16 0425  NA 135  --  138  --   --  137  K 3.9  --  4.2  --   --  3.3*  CL 106  --  110  --   --  107  CO2 22  --  22  --   --  22  GLUCOSE 125*  --  111*  --   --  126*  BUN 14  --  17  --   --  9  CREATININE 2.67*  < > 0.94 0.83 0.88 0.88  CALCIUM 8.4*  --  8.7*  --   --  8.3*  GFRNONAA 29*  < > >60 >60 >60 >60  GFRAA 33*  < > >60 >60 >60 >60  ANIONGAP 7  --  6  --   --  8  < > = values in this interval not displayed.   Recent Labs Lab 08/04/16 2006 08/08/16 0334  PROT 8.1 7.4  ALBUMIN 4.0 3.9  AST 29 35  ALT 26 26  ALKPHOS 94 95  BILITOT 3.9* 2.7*   Hematology Recent Labs Lab 08/08/16 0334 08/09/16 1046 08/11/16 0425  WBC 18.5* 23.9* 16.7*  RBC 2.23*  2.13* 2.63*  2.63* 2.56*  2.56*  HGB 6.6* 7.9* 7.8*  HCT 19.2* 22.8* 22.4*  MCV 86.1 86.7 87.5  MCH 29.6 30.0 30.5  MCHC 34.4 34.6 34.8  RDW 18.5* 18.4* 18.8*  PLT 569* 614* 568*   Cardiac EnzymesNo results for input(s): TROPONINI in the last 168 hours.  Recent Labs  Lab 08/06/16 0029  TROPIPOC 0.01     Radiology/Studies:  No results found.  Assessment and Plan:   1. Large mitral density of MV, concerning for vegetition in the setting of bacteremia. Infection thought to have come from port. -- I would like to talk to Dr. Rodman Key about if she feels like transferring to [facility] would be appropriate given his complex medical history and possible  need for invasive tests and interventions  I will discuss this with Dr. Acie Fredrickson and this will likely be discussed with Dr. Zigmund Daniel.  2. Bacteremia: IM managing  3. Sickle cell  disease: IM managing.  4. Acute kidney injury: creatinine elevated on arrival, now back to normal and baseline at 0.88.    Kristopher Glee, PA-C  08/11/2016 2:10 PM    Attending Note:   The patient was seen and examined.  Agree with assessment and plan as noted above.  Changes made to the above note as needed.  Patient seen and independently examined with Delos Haring, PA .   We discussed all aspects of the encounter. I agree with the assessment and plan as stated above.  1.   MV endocarditis :   Pt had blood cultures drawn during his last admission -  Now [name] has been found to have MV endocarditis By TEE the vegetation is very large and mobile  I have discussed the case with Dr. Zigmund Daniel.   The patient has sickle cell that requires plasma phoresis If [name] needs to have CV surgery, she would like for him to be transferred to [facility] where this is available.    I have spent a total of 40 minutes with patient reviewing hospital  notes , telemetry, EKGs, labs and examining patient as well as establishing an assessment and plan that was discussed with the patient. > 50% of time was spent in direct patient care.

## 2016-08-11 NOTE — Anesthesia Postprocedure Evaluation (Signed)
Anesthesia Post Note  Patient: MAKOTO SELLITTO  Procedure(s) Performed: Procedure(s) (LRB): TRANSESOPHAGEAL ECHOCARDIOGRAM (TEE) (N/A)     Patient location during evaluation: Endoscopy Anesthesia Type: MAC Level of consciousness: awake and alert, oriented and patient uncooperative Pain management: pain level controlled Vital Signs Assessment: post-procedure vital signs reviewed and stable Respiratory status: spontaneous breathing, respiratory function stable and patient connected to nasal cannula oxygen Cardiovascular status: blood pressure returned to baseline and stable Postop Assessment: no headache, adequate PO intake and no signs of nausea or vomiting    Last Vitals:  Vitals:   08/11/16 0922 08/11/16 1039  BP: 111/69 (!) 87/47  Pulse:  84  Resp: 18 (!) 31  Temp: 37.4 C     Last Pain:  Vitals:   08/11/16 1039  TempSrc: Oral  PainSc:                  Anuhea Gassner

## 2016-08-11 NOTE — Progress Notes (Signed)
    Transesophageal Echocardiogram Note  HOLBERT CAPLES [id number] Jul 06, 1980  Procedure: Transesophageal Echocardiogram Indications: Bacteremia  Procedure Details Consent: Obtained Time Out: Verified patient identification, verified procedure, site/side was marked, verified correct patient position, special equipment/implants available, Radiology Safety Procedures followed,  medications/allergies/relevent history reviewed, required imaging and test results available.  Performed  Medications:  Pt sedated by anesthesia with diprovan total 300 mg IV.   Normal LV function; mild LAE; moderate RVE with severely reduced function; moderate RAE; trace AI; large mobile density (2.6 x 1.6 cm) on anterior MV leaflet concerning for vegetation; mild MR; severe TR; possible perforation of septal TV leaflet and also malcoaptation.  Complications: No apparent complications Patient did tolerate procedure well.  Kirk Ruths, MD

## 2016-08-11 NOTE — Anesthesia Preprocedure Evaluation (Addendum)
Anesthesia Evaluation  Patient identified by MRN, date of birth, ID band Patient awake    Reviewed: Allergy & Precautions, H&P , NPO status , Patient's Chart, lab work & pertinent test results, reviewed documented beta blocker date and time   Airway Mallampati: III  TM Distance: >3 FB Neck ROM: Full    Dental no notable dental hx. (+) Teeth Intact, Dental Advisory Given   Pulmonary neg pulmonary ROS, former smoker,    Pulmonary exam normal breath sounds clear to auscultation       Cardiovascular hypertension, Pt. on medications and Pt. on home beta blockers + Valvular Problems/Murmurs  Rhythm:Regular Rate:Normal + Systolic murmurs    Neuro/Psych CVA negative psych ROS   GI/Hepatic negative GI ROS, Neg liver ROS,   Endo/Other  negative endocrine ROS  Renal/GU negative Renal ROS  negative genitourinary   Musculoskeletal   Abdominal   Peds  Hematology negative hematology ROS (+) Sickle cell anemia and anemia ,   Anesthesia Other Findings   Reproductive/Obstetrics negative OB ROS                            Anesthesia Physical Anesthesia Plan  ASA: III  Anesthesia Plan: MAC   Post-op Pain Management:    Induction: Intravenous  PONV Risk Score and Plan: 1 and Propofol  Airway Management Planned: Nasal Cannula  Additional Equipment:   Intra-op Plan:   Post-operative Plan:   Informed Consent: I have reviewed the patients History and Physical, chart, labs and discussed the procedure including the risks, benefits and alternatives for the proposed anesthesia with the patient or authorized representative who has indicated his/her understanding and acceptance.   Dental advisory given  Plan Discussed with: CRNA  Anesthesia Plan Comments:         Anesthesia Quick Evaluation

## 2016-08-12 DIAGNOSIS — B957 Other staphylococcus as the cause of diseases classified elsewhere: Secondary | ICD-10-CM

## 2016-08-12 DIAGNOSIS — I330 Acute and subacute infective endocarditis: Secondary | ICD-10-CM

## 2016-08-12 DIAGNOSIS — I368 Other nonrheumatic tricuspid valve disorders: Secondary | ICD-10-CM

## 2016-08-12 DIAGNOSIS — I058 Other rheumatic mitral valve diseases: Secondary | ICD-10-CM

## 2016-08-12 DIAGNOSIS — Z95828 Presence of other vascular implants and grafts: Secondary | ICD-10-CM

## 2016-08-12 LAB — BASIC METABOLIC PANEL
ANION GAP: 5 (ref 5–15)
BUN: 10 mg/dL (ref 6–20)
CALCIUM: 8.4 mg/dL — AB (ref 8.9–10.3)
CO2: 24 mmol/L (ref 22–32)
Chloride: 106 mmol/L (ref 101–111)
Creatinine, Ser: 0.83 mg/dL (ref 0.61–1.24)
Glucose, Bld: 108 mg/dL — ABNORMAL HIGH (ref 65–99)
POTASSIUM: 3.7 mmol/L (ref 3.5–5.1)
Sodium: 135 mmol/L (ref 135–145)

## 2016-08-12 LAB — RETICULOCYTES
RBC.: 2.62 MIL/uL — AB (ref 4.22–5.81)
RETIC COUNT ABSOLUTE: 170.3 10*3/uL (ref 19.0–186.0)
Retic Ct Pct: 6.5 % — ABNORMAL HIGH (ref 0.4–3.1)

## 2016-08-12 MED ORDER — HEPARIN SOD (PORK) LOCK FLUSH 100 UNIT/ML IV SOLN
500.0000 [IU] | INTRAVENOUS | Status: DC
  Filled 2016-08-12: qty 5

## 2016-08-12 MED ORDER — HEPARIN SOD (PORK) LOCK FLUSH 100 UNIT/ML IV SOLN
500.0000 [IU] | INTRAVENOUS | Status: DC | PRN
  Administered 2016-08-12: 500 [IU]
  Filled 2016-08-12 (×2): qty 5

## 2016-08-12 MED ORDER — HYDROMORPHONE HCL 2 MG PO TABS
4.0000 mg | ORAL_TABLET | Freq: Once | ORAL | Status: DC
  Filled 2016-08-12: qty 2

## 2016-08-12 MED ORDER — VANCOMYCIN HCL IN DEXTROSE 1-5 GM/200ML-% IV SOLN: 1000.0000 mg | Freq: Two times a day (BID) | INTRAVENOUS | Status: AC

## 2016-08-12 NOTE — Progress Notes (Signed)
Pharmacy Antibiotic Note  [name] is a 36 y.o. male admitted on 08/05/2016 with bacteremia and TEE with poss large vegetation.  Pharmacy has been consulted for Vancomycin dosing.  WBC have been trending down and Cr has corrected and is stable.  Will repeat Vanc trough as dose adjusted on 7/10.  Plan: Continue Vancomycin 1000mg  IV q12 Vanc trough in AM Watch renal function   Height: 6' (182.9 cm) Weight: 193 lb 4.8 oz (87.7 kg) IBW/kg (Calculated) : 77.6  Temp (24hrs), Avg:98.6 F (37 C), Min:98.4 F (36.9 C), Max:98.7 F (37.1 C)   Recent Labs Lab 08/06/16 0032 08/06/16 0435  08/08/16 0334 08/09/16 0519 08/09/16 1046 08/09/16 2031 08/10/16 0308 08/11/16 0425 08/11/16 1610 08/12/16 0442  WBC  --  23.2*  --  18.5*  --  23.9*  --   --  16.7* 13.6*  --   CREATININE  --  2.67*  < > 0.94 0.83  --   --  0.88 0.88  --  0.83  LATICACIDVEN 0.80  --   --   --   --   --   --   --   --   --   --   VANCOTROUGH  --   --   --   --   --   --  22*  --   --   --   --   < > = values in this interval not displayed.  Estimated Creatinine Clearance: 133.7 mL/min (by C-G formula based on SCr of 0.83 mg/dL).    No Known Allergies  Antimicrobials this admission:  7/7 vanc >>  Dose adjustments this admission:  7/8: inc vanc to 1250mg  q12h 7/9: inc vanc to 1g q8 hr 7/10 VT at 19:30 = 22  on 1g q8h. Change to 1000 mg IV q12h   Microbiology results:  7/5 bcx x1: 2/2 bottles MRSE - cx from port 7/6  BCx x2: 2/2 MRSE (4/4 bottles) - cx from port and peripheral 7/7 ucx: NGF 7/11  blood ntd  Thank you for allowing pharmacy to be a part of this patient's care.  Lewie Chamber., PharmD Clinical Pharmacist [facility] [phone], or [phone] after 3:30

## 2016-08-12 NOTE — Progress Notes (Signed)
SICKLE CELL SERVICE PROGRESS NOTE  [name] WJX:[id number] DOB: 07/28/1980 DOA: 08/05/2016 PCP: Tresa Garter, MD  Assessment/Plan: Principal Problem:   Coagulase negative Staphylococcus bacteremia Active Problems:   Chronic respiratory failure with hypoxia (HCC)   PAH (pulmonary artery hypertension) (HCC)   Sickle cell anemia (HCC)   AKI (acute kidney injury) ([place])   H/O arterial ischemic stroke   PE (pulmonary thromboembolism) ([place])   Endocarditis of mitral valve   Endocarditis of tricuspid valve  1. S. Epidermidis Endocarditis: Continue Vancomycin. Appreciate ID contribution. Patient to be transferred to [facility] for consideration of open heart surgery and so I will leave Port in place until transfer occurs. They will coordinate removal of port and placement of appropriate IV access at [facility] 2. Cardiac Valvular Disease: Pt has mild MVR and severe TVR and possible perforation of septal TV leaflet and malcoaptation. Pt has been accepted in transfer to [facility] by Dr. Marney Setting for surgical intervention.   3. PAH with Cor Pulmonale: Pt  Has resultant hypoxia and is on 3 L/min of Oxygen. Currently at baseline.  4. 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  5. Chronic Respiratory Failure with Hypoxia: Secondary to Cross and recurrent Hypoxia. At baseline. 6. Anemia of Chronic Disease: Hb currently at baseline after transfusion of 1 unit RBC's.  7. 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 Thus the contribution to his pain from SCD is neglegeble if any.  8. Chronic Pain Syndrome: Continue MS Contin and Gabapentin.   9. AKI: Pt developed an AKI felt to be multifactorial.  Resolved after fluid resuscitation.  10. Hypokalemia: resolved with replacement.  11. S/P CVA: Continue ASA 12. Essential Hypertension: Well controlled. Had been on Lisinopril which was held due to AKI.    Code Status: Full Code Family Communication: N/A Disposition Plan: Not yet  ready for discharge  [place].  Pager [phone]. If 7PM-7AM, please contact night-coverage.  08/12/2016, 11:19 AM  LOS: 6 days   Interim History: Pt's mother here and given an extensive update on patient's condition at his request. He has no new complaints except non-productive cough.   Consultants:  Cardiology  Infectious Disease  Procedures:  TEE 08/11/2016  Antibiotics:  Vancomycin 7/7 >>    Objective: Vitals:   08/11/16 1300 08/11/16 2236 08/12/16 0649 08/12/16 0751  BP: 109/69 112/65 106/66 113/75  Pulse: 76 83 85 (!) 57  Resp: (!) 21 18 18    Temp: 98.4 F (36.9 C) 98.7 F (37.1 C)    TempSrc: Oral     SpO2: 98% 97% 96%   Weight: 87.7 kg (193 lb 4.8 oz)     Height: 6' (1.829 m)      Weight change: -0.281 kg (-9.9 oz)  Intake/Output Summary (Last 24 hours) at 08/12/16 1119 Last data filed at 08/12/16 0932  Gross per 24 hour  Intake              780 ml  Output             1400 ml  Net             -620 ml      Physical Exam General: Alert, awake, oriented x3, in no acute distress.  HEENT: [place]/AT PEERL, EOMI, anicteric Heart: Regular rate and rhythm, II/VI systolic ejection murmurs at base of lungs and heard throughout precordium. No rubs, gallops, PMI non-displaced, no heaves or thrills on palpation.  Lungs: Clear to auscultation, no wheezing or  rhonchi noted. No increased vocal fremitus resonant to percussion  Abdomen: Soft, nontender, nondistended, positive bowel sounds, no masses no hepatosplenomegaly noted.  Neuro: No focal neurological deficits noted cranial nerves II through XII grossly intact. Strength at baseline in bilateral upper and lower extremities. Musculoskeletal: No warmth swelling or erythema around joints, no spinal tenderness noted. Psychiatric: Patient alert and oriented x3, good insight and cognition, good recent to remote recall.    Data Reviewed: Basic Metabolic Panel:  Recent Labs Lab 08/06/16 0014 08/06/16 0435   08/08/16 0334 08/09/16 0519 08/10/16 0308 08/11/16 0425 08/12/16 0442  NA 136 135  --  138  --   --  137 135  K 3.8 3.9  --  4.2  --   --  3.3* 3.7  CL 107 106  --  110  --   --  107 106  CO2 22 22  --  22  --   --  22 24  GLUCOSE 135* 125*  --  111*  --   --  126* 108*  BUN 12 14  --  17  --   --  9 10  CREATININE 2.77* 2.67*  < > 0.94 0.83 0.88 0.88 0.83  CALCIUM 8.7* 8.4*  --  8.7*  --   --  8.3* 8.4*  < > = values in this interval not displayed. Liver Function Tests:  Recent Labs Lab 08/08/16 0334  AST 35  ALT 26  ALKPHOS 95  BILITOT 2.7*  PROT 7.4  ALBUMIN 3.9   No results for input(s): LIPASE, AMYLASE in the last 168 hours. No results for input(s): AMMONIA in the last 168 hours. CBC:  Recent Labs Lab 08/06/16 0014 08/06/16 0435 08/08/16 0334 08/09/16 1046 08/11/16 0425 08/11/16 1610  WBC 25.0* 23.2* 18.5* 23.9* 16.7* 13.6*  NEUTROABS 18.9*  --  13.7* 19.7* 12.8* 10.2*  HGB 7.4* 7.0* 6.6* 7.9* 7.8* 7.7*  HCT 21.6* 20.2* 19.2* 22.8* 22.4* 23.1*  MCV 88.9 88.6 86.1 86.7 87.5 88.2  PLT 630* 601* 569* 614* 568* 528*   Cardiac Enzymes: No results for input(s): CKTOTAL, CKMB, CKMBINDEX, TROPONINI in the last 168 hours. BNP (last 3 results)  Recent Labs  05/16/16 1724 07/01/16 0535 07/26/16 0457  BNP 299.8* 539.3* 494.3*    ProBNP (last 3 results) No results for input(s): PROBNP in the last 8760 hours.  CBG: No results for input(s): GLUCAP in the last 168 hours.  Recent Results (from the past 240 hour(s))  Blood culture (routine x 2)     Status: Abnormal   Collection Time: 08/04/16  7:44 PM  Result Value Ref Range Status   Specimen Description BLOOD PORTA CATH  Final   Special Requests   Final    BOTTLES DRAWN AEROBIC AND ANAEROBIC Blood Culture adequate volume   Culture  Setup Time   Final    GRAM POSITIVE COCCI IN CLUSTERS IN BOTH AEROBIC AND ANAEROBIC BOTTLES CRITICAL RESULT CALLED TO, READ BACK BY AND VERIFIED WITH: A. ADKINS, RN [phone]  08/05/2016 T. TYSOR    Culture STAPHYLOCOCCUS EPIDERMIDIS (A)  Final   Report Status 08/08/2016 FINAL  Final   Organism ID, Bacteria STAPHYLOCOCCUS EPIDERMIDIS  Final      Susceptibility   Staphylococcus epidermidis - MIC*    CIPROFLOXACIN >=8 RESISTANT Resistant     ERYTHROMYCIN >=8 RESISTANT Resistant     GENTAMICIN <=0.5 SENSITIVE Sensitive     OXACILLIN >=4 RESISTANT Resistant     TETRACYCLINE 2 SENSITIVE Sensitive  VANCOMYCIN 1 SENSITIVE Sensitive     TRIMETH/SULFA <=10 SENSITIVE Sensitive     CLINDAMYCIN <=0.25 SENSITIVE Sensitive     RIFAMPIN <=0.5 SENSITIVE Sensitive     Inducible Clindamycin NEGATIVE Sensitive     * STAPHYLOCOCCUS EPIDERMIDIS  Blood Culture ID Panel (Reflexed)     Status: Abnormal   Collection Time: 08/04/16  7:44 PM  Result Value Ref Range Status   Enterococcus species NOT DETECTED NOT DETECTED Final   Listeria monocytogenes NOT DETECTED NOT DETECTED Final   Staphylococcus species DETECTED (A) NOT DETECTED Final    Comment: Methicillin (oxacillin) resistant coagulase negative staphylococcus. Possible blood culture contaminant (unless isolated from more than one blood culture draw or clinical case suggests pathogenicity). No antibiotic treatment is indicated for blood  culture contaminants. CRITICAL RESULT CALLED TO, READ BACK BY AND VERIFIED WITH: A. ADKINS, RN [phone] 08/05/2016 T. TYSOR    Staphylococcus aureus NOT DETECTED NOT DETECTED Final   Methicillin resistance DETECTED (A) NOT DETECTED Final    Comment: CRITICAL RESULT CALLED TO, READ BACK BY AND VERIFIED WITH: A. ADKINS, RN 2140 08/05/2016 T. TYSOR    Streptococcus species NOT DETECTED NOT DETECTED Final   Streptococcus agalactiae NOT DETECTED NOT DETECTED Final   Streptococcus pneumoniae NOT DETECTED NOT DETECTED Final   Streptococcus pyogenes NOT DETECTED NOT DETECTED Final   Acinetobacter baumannii NOT DETECTED NOT DETECTED Final   Enterobacteriaceae species NOT DETECTED NOT DETECTED Final    Enterobacter cloacae complex NOT DETECTED NOT DETECTED Final   Escherichia coli NOT DETECTED NOT DETECTED Final   Klebsiella oxytoca NOT DETECTED NOT DETECTED Final   Klebsiella pneumoniae NOT DETECTED NOT DETECTED Final   Proteus species NOT DETECTED NOT DETECTED Final   Serratia marcescens NOT DETECTED NOT DETECTED Final   Haemophilus influenzae NOT DETECTED NOT DETECTED Final   Neisseria meningitidis NOT DETECTED NOT DETECTED Final   Pseudomonas aeruginosa NOT DETECTED NOT DETECTED Final   Candida albicans NOT DETECTED NOT DETECTED Final   Candida glabrata NOT DETECTED NOT DETECTED Final   Candida krusei NOT DETECTED NOT DETECTED Final   Candida parapsilosis NOT DETECTED NOT DETECTED Final   Candida tropicalis NOT DETECTED NOT DETECTED Final  Blood culture (routine x 2)     Status: Abnormal   Collection Time: 08/05/16 12:15 AM  Result Value Ref Range Status   Specimen Description BLOOD PORT  Final   Special Requests   Final    BOTTLES DRAWN AEROBIC AND ANAEROBIC Blood Culture adequate volume   Culture  Setup Time   Final    IN BOTH AEROBIC AND ANAEROBIC BOTTLES GRAM POSITIVE COCCI IN CLUSTERS CRITICAL RESULT CALLED TO, READ BACK BY AND VERIFIED WITH: PHARMD E JACKSON [phone] MLM    Culture (A)  Final    STAPHYLOCOCCUS EPIDERMIDIS SUSCEPTIBILITIES PERFORMED ON PREVIOUS CULTURE WITHIN THE LAST 5 DAYS. Performed at [facility], [place] [address], [place], [place] [address]    Report Status 08/09/2016 FINAL  Final  Blood Culture ID Panel (Reflexed)     Status: Abnormal   Collection Time: 08/05/16 12:15 AM  Result Value Ref Range Status   Enterococcus species NOT DETECTED NOT DETECTED Final   Listeria monocytogenes NOT DETECTED NOT DETECTED Final   Staphylococcus species DETECTED (A) NOT DETECTED Final    Comment: Methicillin (oxacillin) resistant coagulase negative staphylococcus. Possible blood culture contaminant (unless isolated from more than one blood culture draw or  clinical case suggests pathogenicity). No antibiotic treatment is indicated for blood  culture contaminants.  CRITICAL RESULT CALLED TO, READ BACK BY AND VERIFIED WITH: PHARMD E JACKSON [id number] MLM    Staphylococcus aureus NOT DETECTED NOT DETECTED Final   Methicillin resistance DETECTED (A) NOT DETECTED Final    Comment: CRITICAL RESULT CALLED TO, READ BACK BY AND VERIFIED WITH: PHARMD E JACKSON [id number] MLM    Streptococcus species NOT DETECTED NOT DETECTED Final   Streptococcus agalactiae NOT DETECTED NOT DETECTED Final   Streptococcus pneumoniae NOT DETECTED NOT DETECTED Final   Streptococcus pyogenes NOT DETECTED NOT DETECTED Final   Acinetobacter baumannii NOT DETECTED NOT DETECTED Final   Enterobacteriaceae species NOT DETECTED NOT DETECTED Final   Enterobacter cloacae complex NOT DETECTED NOT DETECTED Final   Escherichia coli NOT DETECTED NOT DETECTED Final   Klebsiella oxytoca NOT DETECTED NOT DETECTED Final   Klebsiella pneumoniae NOT DETECTED NOT DETECTED Final   Proteus species NOT DETECTED NOT DETECTED Final   Serratia marcescens NOT DETECTED NOT DETECTED Final   Haemophilus influenzae NOT DETECTED NOT DETECTED Final   Neisseria meningitidis NOT DETECTED NOT DETECTED Final   Pseudomonas aeruginosa NOT DETECTED NOT DETECTED Final   Candida albicans NOT DETECTED NOT DETECTED Final   Candida glabrata NOT DETECTED NOT DETECTED Final   Candida krusei NOT DETECTED NOT DETECTED Final   Candida parapsilosis NOT DETECTED NOT DETECTED Final   Candida tropicalis NOT DETECTED NOT DETECTED Final    Comment: Performed at [facility], [place]. [address] [place], [place] [address]  Blood culture (routine x 2)     Status: Abnormal   Collection Time: 08/05/16 11:17 PM  Result Value Ref Range Status   Specimen Description BLOOD RIGHT HAND  Final   Special Requests   Final    BOTTLES DRAWN AEROBIC AND ANAEROBIC Blood Culture adequate volume   Culture  Setup Time   Final     GRAM POSITIVE COCCI IN CLUSTERS IN BOTH AEROBIC AND ANAEROBIC BOTTLES CRITICAL RESULT CALLED TO, READ BACK BY AND VERIFIED WITH: E JACKSON,PHARMD AT 1226 08/07/16 BY L BENFIELD    Culture (A)  Final    STAPHYLOCOCCUS EPIDERMIDIS SUSCEPTIBILITIES PERFORMED ON PREVIOUS CULTURE WITHIN THE LAST 5 DAYS. Performed at [facility], [place] [address], [place], [place] [address]    Report Status 08/09/2016 FINAL  Final  Urine culture     Status: None   Collection Time: 08/06/16 11:40 AM  Result Value Ref Range Status   Specimen Description URINE, RANDOM  Final   Special Requests NONE  Final   Culture   Final    NO GROWTH Performed at [facility], [place] [address], [place], [place] [address]    Report Status 08/07/2016 FINAL  Final  Culture, blood (routine x 2)     Status: None (Preliminary result)   Collection Time: 08/10/16 12:16 AM  Result Value Ref Range Status   Specimen Description BLOOD BLOOD RIGHT HAND  Final   Special Requests IN PEDIATRIC BOTTLE Blood Culture adequate volume  Final   Culture   Final    NO GROWTH 1 DAY Performed at [facility], [place] [address] [place], [place] [address]    Report Status PENDING  Incomplete  Culture, blood (routine x 2)     Status: None (Preliminary result)   Collection Time: 08/10/16 12:20 AM  Result Value Ref Range Status   Specimen Description BLOOD BLOOD RIGHT HAND  Final   Special Requests IN PEDIATRIC BOTTLE Blood Culture adequate volume  Final   Culture  Final    NO GROWTH 1 DAY Performed at [facility], [place] [address], [place], [place] [address]    Report Status PENDING  Incomplete     Studies: Dg Chest 2 View  Result Date: 08/05/2016 CLINICAL DATA:  Positive blood cultures.  Rib pain. EXAM: CHEST  2 VIEW COMPARISON:  Radiographs and CT 07/26/2016 FINDINGS: Left chest port tip in the mid SVC. Stable cardiomegaly. Mild central pulmonary edema. No pleural effusion. No focal airspace disease. No pneumothorax. Stable  osseous structures. IMPRESSION: Stable cardiomegaly with mild pulmonary edema. No focal airspace disease. Electronically Signed   By: Jeb Levering M.D.   On: 08/05/2016 23:46   Dg Chest 2 View  Result Date: 07/20/2016 CLINICAL DATA:  36 year old male with sickle cell disease. Bilateral rib and leg pain. EXAM: CHEST  2 VIEW COMPARISON:  Chest CTA 07/18/2016 and earlier. FINDINGS: Stable cardiomegaly and mediastinal contours. Stable left subclavian Port-A-Cath, accessed. Stable to mildly improved lung volumes. No pneumothorax. Stable pulmonary vascularity without acute edema. No pleural effusion or confluent pulmonary opacity. Stable cholecystectomy clips. Negative visible bowel gas pattern. Stable visualized osseous structures, osteopenia and also H-type vertebral body changes better demonstrated on the recent CTA. IMPRESSION: Stable cardiomegaly. No acute cardiopulmonary abnormality. Electronically Signed   By: Genevie Ann M.D.   On: 07/20/2016 16:47   Dg Chest 2 View  Result Date: 07/18/2016 CLINICAL DATA:  Bilateral chest and back pain, somnolence. Former smoker. History of sickle cell anemia, pulmonary embolus. EXAM: CHEST  2 VIEW COMPARISON:  Portable chest x-ray of July 14, 2016 FINDINGS: The lungs are well-expanded. There is no focal infiltrate. No significant pleural effusion is visible. The cardiac silhouette remains enlarged. The pulmonary vascularity remains engorged. The pulmonary interstitial markings are mildly increased over baseline. The power port catheter tip projects over the cavoatrial junction. The mediastinum is normal in width. The bony thorax exhibits no acute abnormality. IMPRESSION: COPD and smoking related changes, stable. Mild pulmonary interstitial prominence bilaterally suggests low-grade CHF. Stable cardiomegaly. Electronically Signed   By: David  Martinique M.D.   On: 07/18/2016 13:05   Ct Angio Chest Pe W And/or Wo Contrast  Result Date: 07/26/2016 CLINICAL DATA:  36 y/o M;  rib pain, abdominal pain, jaundice, history of sickle cell, and history of asplenia. EXAM: CT ANGIOGRAPHY CHEST WITH CONTRAST TECHNIQUE: Multidetector CT imaging of the chest was performed using the standard protocol during bolus administration of intravenous contrast. Multiplanar CT image reconstructions and MIPs were obtained to evaluate the vascular anatomy. CONTRAST:  100 cc Isovue 370 COMPARISON:  Concurrent CT angiogram of the abdomen and pelvis. 07/18/2016 CT angiogram of the chest. FINDINGS: Cardiovascular: Enlarged main pulmonary artery measuring 4.2 cm. Normal caliber thoracic aorta. Stable severe cardiomegaly. Left port catheter tip projects within the right atrium. No pericardial effusion. Multiple segmental acute pulmonary emboli within the lungs bilaterally best appreciated in the right lower lobe (series 7, image 151). Mediastinum/Nodes: No enlarged mediastinal, hilar, or axillary lymph nodes. Thyroid gland, trachea, and esophagus demonstrate no significant findings. Lungs/Pleura: Stable 6 mm nodule in the right middle lobe. Septal thickening of the lung bases and right perihilar ground-glass opacities probably represents mild pulmonary edema. No pleural effusion. Upper Abdomen: Calcified atrophic spleen. Musculoskeletal: H shaped vertebral body compatible with bony stigmata of sickle cell disease. Review of the MIP images confirms the above findings. IMPRESSION: 1. Multiple segmental acute pulmonary emboli. 2. Stable severe cardiomegaly. 3. Enlarged main pulmonary artery indicates pulmonary artery hypertension. 4. Mild pulmonary edema.  5. Calcified atrophic spleen. These results were called by telephone at the time of interpretation on 07/26/2016 at 3:34 am to Dr. Abigail Butts , who verbally acknowledged these results. Electronically Signed   By: Kristine Garbe M.D.   On: 07/26/2016 03:36   Ct Angio Chest Pe W Or Wo Contrast  Addendum Date: 07/18/2016   ADDENDUM REPORT: 07/18/2016  19:21 ADDENDUM: Add to IMPRESSION: Suspect a degree of underlying pulmonary arterial hypertension given prominence in the main pulmonary outflow tract. Electronically Signed   By: Lowella Grip III M.D.   On: 07/18/2016 19:21   Result Date: 07/18/2016 CLINICAL DATA:  Chest pain.  History of sickle cell disease EXAM: CT ANGIOGRAPHY CHEST WITH CONTRAST TECHNIQUE: Multidetector CT imaging of the chest was performed using the standard protocol during bolus administration of intravenous contrast. Multiplanar CT image reconstructions and MIPs were obtained to evaluate the vascular anatomy. CONTRAST:  90 mL Isovue 370 nonionic COMPARISON:  Chest radiograph July 18, 2016; chest CT May 06, 2016 FINDINGS: Cardiovascular: There is a pulmonary embolus in a posterior segment right lower lobe pulmonary artery branch, similar to prior study. No more central pulmonary embolus evident. There is not felt to be of right heart strain. Heart is diffusely enlarged. There is a small pericardial effusion. There is no thoracic aortic aneurysm or dissection. Port-A-Cath tip is at the cavoatrial junction. Main pulmonary outflow tract measures 3.8 cm in diameter consistent with pulmonary arterial hypertension. Mediastinum/Nodes: Visualized thyroid appears normal. There are multiple mediastinal lymph nodes which appear similar to the previous study. There is not felt to be frank adenopathy by size criteria. Lungs/Pleura: There is bibasilar atelectasis. There is interstitial pulmonary edema. There is a stable nodular opacity in the right middle lobe seen on axial slice 51 series 11 measuring 3 mm. There is a stable nodular opacity in the lateral segment of the right middle lobe measuring 6 x 6 mm, ground-glass in appearance, best seen on axial slice 51 series 11. There is no appreciable pleural effusion or pleural thickening. Upper Abdomen: Spleen is small and calcified consistent with prior infarct from sickle cell disease. There is  reflux of contrast into the inferior vena cava and hepatic veins. Visualized upper abdominal structures otherwise appear unremarkable. Musculoskeletal: There are endplate infarcts and multiple thoracic spine levels. Multiple areas of sclerosis noted consistent with sickle cell disease. No appreciable extramedullary hematopoiesis noted. Review of the MIP images confirms the above findings. IMPRESSION: Small pulmonary embolus in a right lower lobe pulmonary artery branch, also present on most recent study. Suspect small chronic pulmonary embolus focus. No new pulmonary embolus compared to most recent study. Findings felt to be indicative of a degree of congestive heart failure. Reflux of contrast into the hepatic veins and inferior vena cava may indicate increase in right heart pressure. Nodular opacities, largest measuring 6 x 6 mm, ground-glass in appearance. This nodular opacity noted in right middle lobe. Initial follow-up with CT at 6-12 months is recommended to confirm persistence. If persistent, repeat CT is recommended every 2 years until 5 years of stability has been established. This recommendation follows the consensus statement: Guidelines for Management of Incidental Pulmonary Nodules Detected on CT Images: From the Fleischner Society 2017; Radiology 2017; 284:228-243. Multiple small lymph nodes, stable. Bony changes and splenic appearance consistent with known sickle cell disease. Critical Value/emergent results were called by telephone at the time of interpretation on 07/18/2016 at 4:18 pm to Dr. Jola Schmidt , who verbally acknowledged these results. Electronically Signed:  By: Lowella Grip III M.D. On: 07/18/2016 16:19   Dg Chest Port 1 View  Result Date: 07/26/2016 CLINICAL DATA:  Awakened with chest pain. History of sickle cell disease. EXAM: PORTABLE CHEST 1 VIEW COMPARISON:  07/20/2016 FINDINGS: Unchanged prominent cardiac silhouette. The lungs are clear. No pleural effusions. No  pneumothorax. Hilar and mediastinal contours are unremarkable and unchanged. Left-sided port appears intact, extending into the low SVC. IMPRESSION: Enlarged cardiac silhouette without significant interval change. No consolidation or effusion. Electronically Signed   By: Andreas Newport M.D.   On: 07/26/2016 01:52   Dg Chest Port 1 View  Result Date: 07/14/2016 CLINICAL DATA:  36 year old male with history of sickle cell disease presenting with bilateral chest pain, body pain and shortness of breath today. EXAM: PORTABLE CHEST 1 VIEW COMPARISON:  Chest x-ray 07/03/2016. FINDINGS: Mild blunting of the left costophrenic sulcus and obscuration of the lateral left hemidiaphragm suggesting a small left pleural effusion. No right pleural effusion. No definite consolidative airspace disease. Scattered areas of linear scarring in the lung bases, similar to prior studies. Cephalization of the pulmonary vasculature, without frank pulmonary edema. No pneumothorax. Heart size is mildly enlarged. Upper mediastinal contours are within normal limits. Left-sided subclavian single-lumen power porta cath with tip terminating at the superior cavoatrial junction. IMPRESSION: 1. New small left pleural effusion. 2. Chronic scarring in the lung bases bilaterally. 3. Cardiomegaly with pulmonary venous congestion, but no frank pulmonary edema. Electronically Signed   By: Vinnie Langton M.D.   On: 07/14/2016 08:07   Ct Angio Abdomen Pelvis  W &/or Wo Contrast  Result Date: 07/26/2016 CLINICAL DATA:  Chest wall pain and abdominal pain. Sickle cell disease. EXAM: CTA ABDOMEN AND PELVIS wITHOUT AND WITH CONTRAST TECHNIQUE: Multidetector CT imaging of the abdomen and pelvis was performed using the standard protocol during bolus administration of intravenous contrast. Multiplanar reconstructed images and MIPs were obtained and reviewed to evaluate the vascular anatomy. CONTRAST:  150 mL Isovue 370 intravenous COMPARISON:  12/15/2015  FINDINGS: VASCULAR Aorta: Normal caliber with mild atherosclerotic calcification. No dissection or aneurysm. Celiac: Patent without evidence of aneurysm, dissection, vasculitis or significant stenosis. Small calcified spleen. Splenic artery is diminutive, possibly occluded. SMA: Patent without evidence of aneurysm, dissection, vasculitis or significant stenosis. Renals: Both renal arteries are patent without evidence of aneurysm, dissection, vasculitis, fibromuscular dysplasia or significant stenosis. IMA: Patent without evidence of aneurysm, dissection, vasculitis or significant stenosis. Inflow: Normal caliber and intact. Mild atherosclerotic calcification. Proximal Outflow: Bilateral common femoral and visualized portions of the superficial and profunda femoral arteries are patent without evidence of aneurysm, dissection, vasculitis or significant stenosis. Veins: No obvious venous abnormality within the limitations of this arterial phase study. Review of the MIP images confirms the above findings. NON-VASCULAR Hepatobiliary: Cholecystectomy.  No focal liver lesion. Pancreas: Unremarkable. No pancreatic ductal dilatation or surrounding inflammatory changes. Spleen: Small calcified spleen, typical of sickle cell disease. Adrenals/Urinary Tract: Both adrenals are normal. Scattered renal parenchymal scars. Collecting systems and ureters are unremarkable. Urinary bladder is unremarkable. Stomach/Bowel: Stomach is within normal limits. Appendix appears normal. No evidence of bowel wall thickening, distention, or inflammatory changes. Lymphatic: No adenopathy in the abdomen or pelvis. Reproductive: Unremarkable Other: Umbilical hernia containing unobstructed small bowel. 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 No significant skeletal lesion. IMPRESSION: VASCULAR 1. Aortic atherosclerosis. 2. No aneurysm or dissection. Major branches of the aorta are also patent and normal in caliber.  NON-VASCULAR 1. Small calcified spleen, typical of sickle cell  disease. 2. Unobstructed small bowel within an umbilical hernia. 3. No acute findings are evident in the abdomen or pelvis. Electronically Signed   By: Andreas Newport M.D.   On: 07/26/2016 04:00    Scheduled Meds: . carvedilol  3.125 mg Oral BID WC  . cholecalciferol  2,000 Units Oral Daily  . folic acid  2 mg Oral Daily  . gabapentin  300 mg Oral TID  . hydroxyurea  1,000 mg Oral QODAY  . hydroxyurea  500 mg Oral QODAY  . L-glutamine  15 g Oral BID  . morphine  30 mg Oral Q12H  . Rivaroxaban  15 mg Oral BID WC  . [START ON 08/16/2016] rivaroxaban  20 mg Oral Q supper   Continuous Infusions: . diphenhydrAMINE (BENADRYL) IVPB(SICKLE CELL ONLY) 25 mg (08/06/16 2157)  . lactated ringers 1,000 mL (08/11/16 0950)  . vancomycin Stopped (08/12/16 4619)    Principal Problem:   Coagulase negative Staphylococcus bacteremia Active Problems:   Chronic respiratory failure with hypoxia (HCC)   PAH (pulmonary artery hypertension) (HCC)   Sickle cell anemia (HCC)   AKI (acute kidney injury) ([place])   H/O arterial ischemic stroke   PE (pulmonary thromboembolism) ([place])   Endocarditis of mitral valve   Endocarditis of tricuspid valve       In excess of 35 minutes spent during this visit. Greater than 50% involved face to face contact with the patient for assessment, counseling and coordination of care.

## 2016-08-12 NOTE — Progress Notes (Signed)
Patient ID: [name], male   DOB: 01-21-1981, 36 y.o.   MRN: [id number]          [facility] for Infectious Disease  Date of Admission:  08/05/2016           Day 7 vancomycin ASSESSMENT: He has methicillin-resistant coagulase-negative staph bacteremia complicated by mitral valve and probable tricuspid valve endocarditis. He will need 6 full weeks of IV vancomycin therapy. His latest blood culture on 08/10/2016 is negative. There is no evidence of infection around his port. It is safe and reasonable to leave the port in and try to salvage it while curing the infection.  PLAN: 1. Continue IV vancomycin 2. Please call me for any infectious disease questions this weekend  Diagnosis: Endocarditis  Culture Result: Methicillin-resistant coagulase-negative staph  No Known Allergies  OPAT Orders Discharge antibiotics: Per pharmacy protocol vancomycin Aim for Vancomycin trough 15-20 (unless otherwise indicated) Duration: 6 weeks End Date: 09-18-2016  [facility] Care Per Protocol:  Labs weekly while on IV antibiotics: _x_ CBC with differential _x_ BMP __ CMP __ CRP __ ESR __ Vancomycin trough  _NA_ Please pull PIC at completion of IV antibiotics __ Please leave PIC in place until doctor has seen patient or been notified  Fax weekly labs to [phone]   Principal Problem:   Coagulase negative Staphylococcus bacteremia Active Problems:   PE (pulmonary thromboembolism) ([place])   Endocarditis of mitral valve   Endocarditis of tricuspid valve   Sickle cell anemia (HCC)   Chronic respiratory failure with hypoxia (HCC)   PAH (pulmonary artery hypertension) ([place])   AKI (acute kidney injury) ([place])   H/O arterial ischemic stroke   . carvedilol  3.125 mg Oral BID WC  . cholecalciferol  2,000 Units Oral Daily  . folic acid  2 mg Oral Daily  . gabapentin  300 mg Oral TID  . heparin lock flush  500 Units Intracatheter Q30 days  . hydroxyurea  1,000 mg Oral QODAY  .  hydroxyurea  500 mg Oral QODAY  . L-glutamine  15 g Oral BID  . morphine  30 mg Oral Q12H  . Rivaroxaban  15 mg Oral BID WC  . [START ON 08/16/2016] rivaroxaban  20 mg Oral Q supper    SUBJECTIVE: He is feeling better but he is still having chest pain. He is requesting more pain medication.  Review of Systems: Review of Systems  Constitutional: Negative for chills, diaphoresis and fever.  Respiratory: Positive for cough. Negative for sputum production and shortness of breath.   Cardiovascular: Positive for chest pain.  Gastrointestinal: Negative for abdominal pain, diarrhea, nausea and vomiting.    No Known Allergies  OBJECTIVE: Vitals:   08/11/16 2236 08/12/16 0649 08/12/16 0751 08/12/16 1453  BP: 112/65 106/66 113/75 106/68  Pulse: 83 85 (!) 57 88  Resp: _0 Temp: 98.7 F (37.1 C)   100.2 F (37.9 C)  TempSrc:      SpO2: 97% 96%  96%  Weight:      Height:       Body mass index is 26.22 kg/m.  Physical Exam  Constitutional:  He is comfortable and in no distress. He is talking on the phone.  Cardiovascular: Normal rate and regular rhythm.   No murmur heard. Pulmonary/Chest: Effort normal and breath sounds normal.  Left anterior chest Port-A-Cath site looks good.    Lab Results Lab Results  Component Value Date   WBC 13.6 (H) 08/11/2016   HGB  7.7 (L) 08/11/2016   HCT 23.1 (L) 08/11/2016   MCV 88.2 08/11/2016   PLT 528 (H) 08/11/2016    Lab Results  Component Value Date   CREATININE 0.83 08/12/2016   BUN 10 08/12/2016   NA 135 08/12/2016   K 3.7 08/12/2016   CL 106 08/12/2016   CO2 24 08/12/2016    Lab Results  Component Value Date   ALT 26 08/08/2016   AST 35 08/08/2016   ALKPHOS 95 08/08/2016   BILITOT 2.7 (H) 08/08/2016     Microbiology: Recent Results (from the past 240 hour(s))  Blood culture (routine x 2)     Status: Abnormal   Collection Time: 08/04/16  7:44 PM  Result Value Ref Range Status   Specimen Description BLOOD PORTA  CATH  Final   Special Requests   Final    BOTTLES DRAWN AEROBIC AND ANAEROBIC Blood Culture adequate volume   Culture  Setup Time   Final    GRAM POSITIVE COCCI IN CLUSTERS IN BOTH AEROBIC AND ANAEROBIC BOTTLES CRITICAL RESULT CALLED TO, READ BACK BY AND VERIFIED WITH: A. ADKINS, RN [phone] 08/05/2016 T. TYSOR    Culture STAPHYLOCOCCUS EPIDERMIDIS (A)  Final   Report Status 08/08/2016 FINAL  Final   Organism ID, Bacteria STAPHYLOCOCCUS EPIDERMIDIS  Final      Susceptibility   Staphylococcus epidermidis - MIC*    CIPROFLOXACIN >=8 RESISTANT Resistant     ERYTHROMYCIN >=8 RESISTANT Resistant     GENTAMICIN <=0.5 SENSITIVE Sensitive     OXACILLIN >=4 RESISTANT Resistant     TETRACYCLINE 2 SENSITIVE Sensitive     VANCOMYCIN 1 SENSITIVE Sensitive     TRIMETH/SULFA <=10 SENSITIVE Sensitive     CLINDAMYCIN <=0.25 SENSITIVE Sensitive     RIFAMPIN <=0.5 SENSITIVE Sensitive     Inducible Clindamycin NEGATIVE Sensitive     * STAPHYLOCOCCUS EPIDERMIDIS  Blood Culture ID Panel (Reflexed)     Status: Abnormal   Collection Time: 08/04/16  7:44 PM  Result Value Ref Range Status   Enterococcus species NOT DETECTED NOT DETECTED Final   Listeria monocytogenes NOT DETECTED NOT DETECTED Final   Staphylococcus species DETECTED (A) NOT DETECTED Final    Comment: Methicillin (oxacillin) resistant coagulase negative staphylococcus. Possible blood culture contaminant (unless isolated from more than one blood culture draw or clinical case suggests pathogenicity). No antibiotic treatment is indicated for blood  culture contaminants. CRITICAL RESULT CALLED TO, READ BACK BY AND VERIFIED WITH: A. ADKINS, RN [phone] 08/05/2016 T. TYSOR    Staphylococcus aureus NOT DETECTED NOT DETECTED Final   Methicillin resistance DETECTED (A) NOT DETECTED Final    Comment: CRITICAL RESULT CALLED TO, READ BACK BY AND VERIFIED WITH: A. ADKINS, RN [phone] 08/05/2016 T. TYSOR    Streptococcus species NOT DETECTED NOT DETECTED Final    Streptococcus agalactiae NOT DETECTED NOT DETECTED Final   Streptococcus pneumoniae NOT DETECTED NOT DETECTED Final   Streptococcus pyogenes NOT DETECTED NOT DETECTED Final   Acinetobacter baumannii NOT DETECTED NOT DETECTED Final   Enterobacteriaceae species NOT DETECTED NOT DETECTED Final   Enterobacter cloacae complex NOT DETECTED NOT DETECTED Final   Escherichia coli NOT DETECTED NOT DETECTED Final   Klebsiella oxytoca NOT DETECTED NOT DETECTED Final   Klebsiella pneumoniae NOT DETECTED NOT DETECTED Final   Proteus species NOT DETECTED NOT DETECTED Final   Serratia marcescens NOT DETECTED NOT DETECTED Final   Haemophilus influenzae NOT DETECTED NOT DETECTED Final   Neisseria meningitidis NOT DETECTED NOT DETECTED Final   Pseudomonas  aeruginosa NOT DETECTED NOT DETECTED Final   Candida albicans NOT DETECTED NOT DETECTED Final   Candida glabrata NOT DETECTED NOT DETECTED Final   Candida krusei NOT DETECTED NOT DETECTED Final   Candida parapsilosis NOT DETECTED NOT DETECTED Final   Candida tropicalis NOT DETECTED NOT DETECTED Final  Blood culture (routine x 2)     Status: Abnormal   Collection Time: 08/05/16 12:15 AM  Result Value Ref Range Status   Specimen Description BLOOD PORT  Final   Special Requests   Final    BOTTLES DRAWN AEROBIC AND ANAEROBIC Blood Culture adequate volume   Culture  Setup Time   Final    IN BOTH AEROBIC AND ANAEROBIC BOTTLES GRAM POSITIVE COCCI IN CLUSTERS CRITICAL RESULT CALLED TO, READ BACK BY AND VERIFIED WITH: PHARMD E JACKSON [phone] MLM    Culture (A)  Final    STAPHYLOCOCCUS EPIDERMIDIS SUSCEPTIBILITIES PERFORMED ON PREVIOUS CULTURE WITHIN THE LAST 5 DAYS. Performed at [facility], [place] [address] [place], [place] [address]    Report Status 08/09/2016 FINAL  Final  Blood Culture ID Panel (Reflexed)     Status: Abnormal   Collection Time: 08/05/16 12:15 AM  Result Value Ref Range Status   Enterococcus species NOT DETECTED NOT DETECTED  Final   Listeria monocytogenes NOT DETECTED NOT DETECTED Final   Staphylococcus species DETECTED (A) NOT DETECTED Final    Comment: Methicillin (oxacillin) resistant coagulase negative staphylococcus. Possible blood culture contaminant (unless isolated from more than one blood culture draw or clinical case suggests pathogenicity). No antibiotic treatment is indicated for blood  culture contaminants. CRITICAL RESULT CALLED TO, READ BACK BY AND VERIFIED WITH: PHARMD E JACKSON [phone] MLM    Staphylococcus aureus NOT DETECTED NOT DETECTED Final   Methicillin resistance DETECTED (A) NOT DETECTED Final    Comment: CRITICAL RESULT CALLED TO, READ BACK BY AND VERIFIED WITH: PHARMD E JACKSON [phone] MLM    Streptococcus species NOT DETECTED NOT DETECTED Final   Streptococcus agalactiae NOT DETECTED NOT DETECTED Final   Streptococcus pneumoniae NOT DETECTED NOT DETECTED Final   Streptococcus pyogenes NOT DETECTED NOT DETECTED Final   Acinetobacter baumannii NOT DETECTED NOT DETECTED Final   Enterobacteriaceae species NOT DETECTED NOT DETECTED Final   Enterobacter cloacae complex NOT DETECTED NOT DETECTED Final   Escherichia coli NOT DETECTED NOT DETECTED Final   Klebsiella oxytoca NOT DETECTED NOT DETECTED Final   Klebsiella pneumoniae NOT DETECTED NOT DETECTED Final   Proteus species NOT DETECTED NOT DETECTED Final   Serratia marcescens NOT DETECTED NOT DETECTED Final   Haemophilus influenzae NOT DETECTED NOT DETECTED Final   Neisseria meningitidis NOT DETECTED NOT DETECTED Final   Pseudomonas aeruginosa NOT DETECTED NOT DETECTED Final   Candida albicans NOT DETECTED NOT DETECTED Final   Candida glabrata NOT DETECTED NOT DETECTED Final   Candida krusei NOT DETECTED NOT DETECTED Final   Candida parapsilosis NOT DETECTED NOT DETECTED Final   Candida tropicalis NOT DETECTED NOT DETECTED Final    Comment: Performed at [facility], [place]. [address], [place], [place] [address]  Blood  culture (routine x 2)     Status: Abnormal   Collection Time: 08/05/16 11:17 PM  Result Value Ref Range Status   Specimen Description BLOOD RIGHT HAND  Final   Special Requests   Final    BOTTLES DRAWN AEROBIC AND ANAEROBIC Blood Culture adequate volume   Culture  Setup Time   Final    GRAM POSITIVE COCCI IN  CLUSTERS IN BOTH AEROBIC AND ANAEROBIC BOTTLES CRITICAL RESULT CALLED TO, READ BACK BY AND VERIFIED WITH: E JACKSON,PHARMD AT 1226 08/07/16 BY L BENFIELD    Culture (A)  Final    STAPHYLOCOCCUS EPIDERMIDIS SUSCEPTIBILITIES PERFORMED ON PREVIOUS CULTURE WITHIN THE LAST 5 DAYS. Performed at [facility], [place] [address], [place], [place] [address]    Report Status 08/09/2016 FINAL  Final  Urine culture     Status: None   Collection Time: 08/06/16 11:40 AM  Result Value Ref Range Status   Specimen Description URINE, RANDOM  Final   Special Requests NONE  Final   Culture   Final    NO GROWTH Performed at [facility], [place] [address], [place], [place] [address]    Report Status 08/07/2016 FINAL  Final  Culture, blood (routine x 2)     Status: None (Preliminary result)   Collection Time: 08/10/16 12:16 AM  Result Value Ref Range Status   Specimen Description BLOOD BLOOD RIGHT HAND  Final   Special Requests IN PEDIATRIC BOTTLE Blood Culture adequate volume  Final   Culture   Final    NO GROWTH 2 DAYS Performed at [facility], [place] [address] [place], [place] [address]    Report Status PENDING  Incomplete  Culture, blood (routine x 2)     Status: None (Preliminary result)   Collection Time: 08/10/16 12:20 AM  Result Value Ref Range Status   Specimen Description BLOOD BLOOD RIGHT HAND  Final   Special Requests IN PEDIATRIC BOTTLE Blood Culture adequate volume  Final   Culture   Final    NO GROWTH 2 DAYS Performed at [facility], [place] [address] [place], [place] [address]    Report Status PENDING  Incomplete    Michel Bickers, MD [facility]  [employer] [phone] pager   [phone] cell 08/12/2016, 4:32 PM

## 2016-08-12 NOTE — Progress Notes (Addendum)
JASMAN MURRI MRN: [id number] DOB/AGE: [birth year] 36 y.o.  Admit date: 08/05/2016 Discharge date: not yet determined  Primary Care Physician:  Tresa Garter, MD   Discharge Diagnoses:   Patient Active Problem List   Diagnosis Date Noted  . Cor pulmonale, chronic ([place]) 08/25/2014    Priority: High  . Pulmonary HTN ([place]) 06/18/2013    Priority: High  . Functional asplenia     Priority: High  . Anemia of chronic disease 06/25/2014    Priority: Medium  . Chronic respiratory failure with hypoxia ([place]) 03/14/2014    Priority: Medium  . Chronic anticoagulation 08/22/2013    Priority: Medium  . Avascular necrosis ([place])     Priority: Low  . Endocarditis of mitral valve 08/11/2016  . 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 Coagulase negative Staphylococcus bacteremia 08/05/2016  . PE (pulmonary thromboembolism) ([place]) 07/26/2016  . H/O arterial ischemic stroke 11/11/2015  . AKI (acute kidney injury) ([place])   . Prolonged Q-T interval on ECG 09/10/2014  . Sickle cell anemia ([place]) 06/25/2014  . PAH (pulmonary artery hypertension) ([place]) 03/18/2014  . Paralytic strabismus, external ophthalmoplegia   . Chronic pain syndrome 12/12/2013  . Essential hypertension 08/22/2013  . Vitamin D deficiency 02/13/2013  . 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   cholecalciferol 1000 units tablet Commonly known as:  VITAMIN D Take 2,000 Units by mouth daily.   folic acid 1 MG tablet Commonly known as:  FOLVITE Take 2 tablets (2 mg total) by mouth daily.   gabapentin 300 MG capsule Commonly known as:  NEURONTIN Take 1 capsule (300  mg total) by mouth 3 (three) times daily.   HYDROmorphone 4 MG tablet Commonly known as:  DILAUDID Take 1 tablet (4 mg total) by mouth every 4 (four) hours as needed for moderate pain or severe pain.   hydroxyurea 500 MG capsule Commonly known as:  HYDREA Take 1,000 mg by mouth every other day. May take with food to minimize GI side effects.   hydroxyurea 500 MG capsule Commonly known as:  HYDREA Take 500 mg by mouth every other day. May take with food to minimize GI side effects.   L-glutamine 5 g Pack Powder Packet Commonly known as:  ENDARI Take 15 g by mouth 2 (two) times daily.   morphine 30 MG 12 hr tablet Commonly known as:  MS CONTIN Take 1 tablet (30 mg total) by mouth every 12 (twelve) hours.   potassium chloride SA 20 MEQ tablet Commonly known as:  K-DUR,KLOR-CON Take 2 tablets (40 mEq total) by mouth daily.   Rivaroxaban 15 & 20 MG Tbpk Take as directed on package: Start with one 15mg  tablet by mouth twice a day with food. On Day 22, switch to one 20mg  tablet once a day with food. What changed:  how much to take  how to take this  when to take this  additional instructions   torsemide 20 MG tablet Commonly known as:  DEMADEX Take 1 tablet (20 mg total) by mouth daily as needed (leg swelling).   vancomycin 1-5 GM/200ML-% Soln Commonly known as:  VANCOCIN Inject 200 mLs (1,000 mg total) into the vein every 12 (twelve) hours.   zolpidem 10 MG tablet Commonly known as:  AMBIEN Take 1 tablet (10 mg total) by mouth at bedtime as needed for sleep.         Consults:    SIGNIFICANT DIAGNOSTIC STUDIES:  Dg Chest 2 View  Result Date: 08/05/2016 CLINICAL DATA:  Positive blood cultures.  Rib pain. EXAM: CHEST  2 VIEW COMPARISON:  Radiographs and CT 07/26/2016 FINDINGS: Left chest port tip in the mid SVC. Stable cardiomegaly. Mild central pulmonary edema. No pleural effusion. No focal airspace disease. No pneumothorax. Stable osseous structures. IMPRESSION:  Stable cardiomegaly with mild pulmonary edema. No focal airspace disease. Electronically Signed   By: Jeb Levering M.D.   On: 08/05/2016 23:46   Dg Chest 2 View  Result Date: 07/20/2016 CLINICAL DATA:  36 year old male with sickle cell disease. Bilateral rib and leg pain. EXAM: CHEST  2 VIEW COMPARISON:  Chest CTA 07/18/2016 and earlier. FINDINGS: Stable cardiomegaly and mediastinal contours. Stable left subclavian Port-A-Cath, accessed. Stable to mildly improved lung volumes. No pneumothorax. Stable pulmonary vascularity without acute edema. No pleural effusion or confluent pulmonary opacity. Stable cholecystectomy clips. Negative visible bowel gas pattern. Stable visualized osseous structures, osteopenia and also H-type vertebral body changes better demonstrated on the recent CTA. IMPRESSION: Stable cardiomegaly. No acute cardiopulmonary abnormality. Electronically Signed   By: Genevie Ann M.D.   On: 07/20/2016 16:47   Dg Chest 2 View  Result Date: 07/18/2016 CLINICAL DATA:  Bilateral chest and back pain, somnolence. Former smoker. History of sickle cell anemia, pulmonary embolus. EXAM: CHEST  2 VIEW COMPARISON:  Portable chest x-ray of July 14, 2016 FINDINGS: The lungs are well-expanded. There is no focal infiltrate. No significant pleural effusion is visible. The cardiac silhouette remains enlarged. The pulmonary vascularity remains engorged. The pulmonary interstitial markings are mildly increased over baseline. The power port catheter tip projects over the cavoatrial junction. The mediastinum is normal in width. The bony thorax exhibits no acute abnormality. IMPRESSION: COPD and smoking related changes, stable. Mild pulmonary interstitial prominence bilaterally suggests low-grade CHF. Stable cardiomegaly. Electronically Signed   By: David  Martinique M.D.   On: 07/18/2016 13:05   Ct Angio Chest Pe W And/or Wo Contrast  Result Date: 07/26/2016 CLINICAL DATA:  36 y/o M; rib pain, abdominal pain,  jaundice, history of sickle cell, and history of asplenia. EXAM: CT ANGIOGRAPHY CHEST WITH CONTRAST TECHNIQUE: Multidetector CT imaging of the chest was performed using the standard protocol during bolus administration of intravenous contrast. Multiplanar CT image reconstructions and MIPs were obtained to evaluate the vascular anatomy. CONTRAST:  100 cc Isovue 370 COMPARISON:  Concurrent CT angiogram of the abdomen and pelvis. 07/18/2016 CT angiogram of the chest. FINDINGS: Cardiovascular: Enlarged main pulmonary artery measuring 4.2 cm. Normal caliber thoracic aorta. Stable severe cardiomegaly. Left port catheter tip projects within the right atrium. No pericardial effusion. Multiple segmental acute pulmonary emboli within the lungs bilaterally best appreciated in the right lower lobe (series 7, image 151). Mediastinum/Nodes: No enlarged mediastinal, hilar, or axillary lymph nodes. Thyroid gland, trachea, and esophagus demonstrate no significant findings. Lungs/Pleura: Stable 6 mm nodule in the right middle lobe. Septal thickening of the lung bases and right perihilar ground-glass opacities probably represents mild pulmonary edema. No pleural effusion. Upper Abdomen: Calcified atrophic spleen. Musculoskeletal: H shaped vertebral body compatible with bony stigmata of sickle cell disease. Review of the MIP images confirms the above findings. IMPRESSION: 1. Multiple  segmental acute pulmonary emboli. 2. Stable severe cardiomegaly. 3. Enlarged main pulmonary artery indicates pulmonary artery hypertension. 4. Mild pulmonary edema. 5. Calcified atrophic spleen. These results were called by telephone at the time of interpretation on 07/26/2016 at 3:34 am to Dr. Abigail Butts , who verbally acknowledged these results. Electronically Signed   By: Kristine Garbe M.D.   On: 07/26/2016 03:36   Ct Angio Chest Pe W Or Wo Contrast  Addendum Date: 07/18/2016   ADDENDUM REPORT: 07/18/2016 19:21 ADDENDUM: Add to  IMPRESSION: Suspect a degree of underlying pulmonary arterial hypertension given prominence in the main pulmonary outflow tract. Electronically Signed   By: Lowella Grip III M.D.   On: 07/18/2016 19:21   Result Date: 07/18/2016 CLINICAL DATA:  Chest pain.  History of sickle cell disease EXAM: CT ANGIOGRAPHY CHEST WITH CONTRAST TECHNIQUE: Multidetector CT imaging of the chest was performed using the standard protocol during bolus administration of intravenous contrast. Multiplanar CT image reconstructions and MIPs were obtained to evaluate the vascular anatomy. CONTRAST:  90 mL Isovue 370 nonionic COMPARISON:  Chest radiograph July 18, 2016; chest CT May 06, 2016 FINDINGS: Cardiovascular: There is a pulmonary embolus in a posterior segment right lower lobe pulmonary artery branch, similar to prior study. No more central pulmonary embolus evident. There is not felt to be of right heart strain. Heart is diffusely enlarged. There is a small pericardial effusion. There is no thoracic aortic aneurysm or dissection. Port-A-Cath tip is at the cavoatrial junction. Main pulmonary outflow tract measures 3.8 cm in diameter consistent with pulmonary arterial hypertension. Mediastinum/Nodes: Visualized thyroid appears normal. There are multiple mediastinal lymph nodes which appear similar to the previous study. There is not felt to be frank adenopathy by size criteria. Lungs/Pleura: There is bibasilar atelectasis. There is interstitial pulmonary edema. There is a stable nodular opacity in the right middle lobe seen on axial slice 51 series 11 measuring 3 mm. There is a stable nodular opacity in the lateral segment of the right middle lobe measuring 6 x 6 mm, ground-glass in appearance, best seen on axial slice 51 series 11. There is no appreciable pleural effusion or pleural thickening. Upper Abdomen: Spleen is small and calcified consistent with prior infarct from sickle cell disease. There is reflux of contrast into the  inferior vena cava and hepatic veins. Visualized upper abdominal structures otherwise appear unremarkable. Musculoskeletal: There are endplate infarcts and multiple thoracic spine levels. Multiple areas of sclerosis noted consistent with sickle cell disease. No appreciable extramedullary hematopoiesis noted. Review of the MIP images confirms the above findings. IMPRESSION: Small pulmonary embolus in a right lower lobe pulmonary artery branch, also present on most recent study. Suspect small chronic pulmonary embolus focus. No new pulmonary embolus compared to most recent study. Findings felt to be indicative of a degree of congestive heart failure. Reflux of contrast into the hepatic veins and inferior vena cava may indicate increase in right heart pressure. Nodular opacities, largest measuring 6 x 6 mm, ground-glass in appearance. This nodular opacity noted in right middle lobe. Initial follow-up with CT at 6-12 months is recommended to confirm persistence. If persistent, repeat CT is recommended every 2 years until 5 years of stability has been established. This recommendation follows the consensus statement: Guidelines for Management of Incidental Pulmonary Nodules Detected on CT Images: From the Fleischner Society 2017; Radiology 2017; 284:228-243. Multiple small lymph nodes, stable. Bony changes and splenic appearance consistent with known sickle cell disease. Critical Value/emergent results were called by telephone at  the time of interpretation on 07/18/2016 at 4:18 pm to Dr. Jola Schmidt , who verbally acknowledged these results. Electronically Signed: By: Lowella Grip III M.D. On: 07/18/2016 16:19   Dg Chest Port 1 View  Result Date: 07/26/2016 CLINICAL DATA:  Awakened with chest pain. History of sickle cell disease. EXAM: PORTABLE CHEST 1 VIEW COMPARISON:  07/20/2016 FINDINGS: Unchanged prominent cardiac silhouette. The lungs are clear. No pleural effusions. No pneumothorax. Hilar and mediastinal  contours are unremarkable and unchanged. Left-sided port appears intact, extending into the low SVC. IMPRESSION: Enlarged cardiac silhouette without significant interval change. No consolidation or effusion. Electronically Signed   By: Andreas Newport M.D.   On: 07/26/2016 01:52   Dg Chest Port 1 View  Result Date: 07/14/2016 CLINICAL DATA:  36 year old male with history of sickle cell disease presenting with bilateral chest pain, body pain and shortness of breath today. EXAM: PORTABLE CHEST 1 VIEW COMPARISON:  Chest x-ray 07/03/2016. FINDINGS: Mild blunting of the left costophrenic sulcus and obscuration of the lateral left hemidiaphragm suggesting a small left pleural effusion. No right pleural effusion. No definite consolidative airspace disease. Scattered areas of linear scarring in the lung bases, similar to prior studies. Cephalization of the pulmonary vasculature, without frank pulmonary edema. No pneumothorax. Heart size is mildly enlarged. Upper mediastinal contours are within normal limits. Left-sided subclavian single-lumen power porta cath with tip terminating at the superior cavoatrial junction. IMPRESSION: 1. New small left pleural effusion. 2. Chronic scarring in the lung bases bilaterally. 3. Cardiomegaly with pulmonary venous congestion, but no frank pulmonary edema. Electronically Signed   By: Vinnie Langton M.D.   On: 07/14/2016 08:07   Ct Angio Abdomen Pelvis  W &/or Wo Contrast  Result Date: 07/26/2016 CLINICAL DATA:  Chest wall pain and abdominal pain. Sickle cell disease. EXAM: CTA ABDOMEN AND PELVIS wITHOUT AND WITH CONTRAST TECHNIQUE: Multidetector CT imaging of the abdomen and pelvis was performed using the standard protocol during bolus administration of intravenous contrast. Multiplanar reconstructed images and MIPs were obtained and reviewed to evaluate the vascular anatomy. CONTRAST:  150 mL Isovue 370 intravenous COMPARISON:  12/15/2015 FINDINGS: VASCULAR Aorta: Normal  caliber with mild atherosclerotic calcification. No dissection or aneurysm. Celiac: Patent without evidence of aneurysm, dissection, vasculitis or significant stenosis. Small calcified spleen. Splenic artery is diminutive, possibly occluded. SMA: Patent without evidence of aneurysm, dissection, vasculitis or significant stenosis. Renals: Both renal arteries are patent without evidence of aneurysm, dissection, vasculitis, fibromuscular dysplasia or significant stenosis. IMA: Patent without evidence of aneurysm, dissection, vasculitis or significant stenosis. Inflow: Normal caliber and intact. Mild atherosclerotic calcification. Proximal Outflow: Bilateral common femoral and visualized portions of the superficial and profunda femoral arteries are patent without evidence of aneurysm, dissection, vasculitis or significant stenosis. Veins: No obvious venous abnormality within the limitations of this arterial phase study. Review of the MIP images confirms the above findings. NON-VASCULAR Hepatobiliary: Cholecystectomy.  No focal liver lesion. Pancreas: Unremarkable. No pancreatic ductal dilatation or surrounding inflammatory changes. Spleen: Small calcified spleen, typical of sickle cell disease. Adrenals/Urinary Tract: Both adrenals are normal. Scattered renal parenchymal scars. Collecting systems and ureters are unremarkable. Urinary bladder is unremarkable. Stomach/Bowel: Stomach is within normal limits. Appendix appears normal. No evidence of bowel wall thickening, distention, or inflammatory changes. Lymphatic: No adenopathy in the abdomen or pelvis. Reproductive: Unremarkable Other: Umbilical hernia containing unobstructed small bowel. 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 No significant skeletal lesion. IMPRESSION: VASCULAR 1. Aortic atherosclerosis. 2. No aneurysm or dissection. Major  branches of the aorta are also patent and normal in caliber. NON-VASCULAR 1. Small calcified  spleen, typical of sickle cell disease. 2. Unobstructed small bowel within an umbilical hernia. 3. No acute findings are evident in the abdomen or pelvis. Electronically Signed   By: Andreas Newport M.D.   On: 07/26/2016 04:00     ECHO:   Study Conclusions 08/07/2016 - Left ventricle: The cavity size was normal. Wall thickness was   increased in a pattern of mild LVH. Systolic function was normal.   The estimated ejection fraction was in the range of 60% to 65%. - Ventricular septum: The contour showed diastolic flattening and   systolic flattening. These changes are consistent with RV volume   and pressure overload. - Left atrium: The atrium was moderately dilated. - Right ventricle: The cavity size was severely dilated. Systolic   function was reduced. - Right atrium: The atrium was dilated. - Tricuspid valve: There was severe regurgitation directed   eccentrically. - Pulmonary arteries: PA peak pressure: 55 mm Hg (S). - Systemic veins: IVC is dilated with normal respiratory variation.   Estimated RAP 8 mmHg. - Pericardium, extracardiac: A trivial pericardial effusion was   identified.  Impressions:  - No obvious vegetations noted.  TEE  - Left ventricle: Systolic function was normal. The estimated   ejection fraction was in the range of 55% to 60%. Wall motion was   normal; there were no regional wall motion abnormalities. - Aortic valve: No evidence of vegetation. There was trivial   regurgitation. - Mitral valve: There was an apparent, large vegetation on the   anterior leaflet. There was mild regurgitation. - Left atrium: The atrium was mildly dilated. - Right ventricle: The cavity size was moderately dilated. Systolic   function was severely reduced. - Right atrium: The atrium was moderately dilated. - Atrial septum: There was a possible patent foramen ovale. - Tricuspid valve: There was severe regurgitation. - Pulmonic valve: No evidence of vegetation. -  Pericardium, extracardiac: A small pericardial effusion was   identified.  Impressions:  - Normal LV systolic function; mild LAE; moderate RVE with severely   reduced function; moderate RAE; small pericardial effusion; large   mass (2.6 x 1.6 cm) on anterior MV leaflet concerning for   vegetation; mild MR; malcoaptation of TV leaflets with possible   perforated septal leaflet; severe TR; possible PFO.   Recent Results (from the past 240 hour(s))  Blood culture (routine x 2)     Status: Abnormal   Collection Time: 08/04/16  7:44 PM  Result Value Ref Range Status   Specimen Description BLOOD PORTA CATH  Final   Special Requests   Final    BOTTLES DRAWN AEROBIC AND ANAEROBIC Blood Culture adequate volume   Culture  Setup Time   Final    GRAM POSITIVE COCCI IN CLUSTERS IN BOTH AEROBIC AND ANAEROBIC BOTTLES CRITICAL RESULT CALLED TO, READ BACK BY AND VERIFIED WITH: A. ADKINS, RN [phone] 08/05/2016 T. TYSOR    Culture STAPHYLOCOCCUS EPIDERMIDIS (A)  Final   Report Status 08/08/2016 FINAL  Final   Organism ID, Bacteria STAPHYLOCOCCUS EPIDERMIDIS  Final      Susceptibility   Staphylococcus epidermidis - MIC*    CIPROFLOXACIN >=8 RESISTANT Resistant     ERYTHROMYCIN >=8 RESISTANT Resistant     GENTAMICIN <=0.5 SENSITIVE Sensitive     OXACILLIN >=4 RESISTANT Resistant     TETRACYCLINE 2 SENSITIVE Sensitive     VANCOMYCIN 1 SENSITIVE Sensitive  TRIMETH/SULFA <=10 SENSITIVE Sensitive     CLINDAMYCIN <=0.25 SENSITIVE Sensitive     RIFAMPIN <=0.5 SENSITIVE Sensitive     Inducible Clindamycin NEGATIVE Sensitive     * STAPHYLOCOCCUS EPIDERMIDIS  Blood Culture ID Panel (Reflexed)     Status: Abnormal   Collection Time: 08/04/16  7:44 PM  Result Value Ref Range Status   Enterococcus species NOT DETECTED NOT DETECTED Final   Listeria monocytogenes NOT DETECTED NOT DETECTED Final   Staphylococcus species DETECTED (A) NOT DETECTED Final    Comment: Methicillin (oxacillin) resistant  coagulase negative staphylococcus. Possible blood culture contaminant (unless isolated from more than one blood culture draw or clinical case suggests pathogenicity). No antibiotic treatment is indicated for blood  culture contaminants. CRITICAL RESULT CALLED TO, READ BACK BY AND VERIFIED WITH: A. ADKINS, RN [phone] 08/05/2016 T. TYSOR    Staphylococcus aureus NOT DETECTED NOT DETECTED Final   Methicillin resistance DETECTED (A) NOT DETECTED Final    Comment: CRITICAL RESULT CALLED TO, READ BACK BY AND VERIFIED WITH: A. ADKINS, RN [phone] 08/05/2016 T. TYSOR    Streptococcus species NOT DETECTED NOT DETECTED Final   Streptococcus agalactiae NOT DETECTED NOT DETECTED Final   Streptococcus pneumoniae NOT DETECTED NOT DETECTED Final   Streptococcus pyogenes NOT DETECTED NOT DETECTED Final   Acinetobacter baumannii NOT DETECTED NOT DETECTED Final   Enterobacteriaceae species NOT DETECTED NOT DETECTED Final   Enterobacter cloacae complex NOT DETECTED NOT DETECTED Final   Escherichia coli NOT DETECTED NOT DETECTED Final   Klebsiella oxytoca NOT DETECTED NOT DETECTED Final   Klebsiella pneumoniae NOT DETECTED NOT DETECTED Final   Proteus species NOT DETECTED NOT DETECTED Final   Serratia marcescens NOT DETECTED NOT DETECTED Final   Haemophilus influenzae NOT DETECTED NOT DETECTED Final   Neisseria meningitidis NOT DETECTED NOT DETECTED Final   Pseudomonas aeruginosa NOT DETECTED NOT DETECTED Final   Candida albicans NOT DETECTED NOT DETECTED Final   Candida glabrata NOT DETECTED NOT DETECTED Final   Candida krusei NOT DETECTED NOT DETECTED Final   Candida parapsilosis NOT DETECTED NOT DETECTED Final   Candida tropicalis NOT DETECTED NOT DETECTED Final  Blood culture (routine x 2)     Status: Abnormal   Collection Time: 08/05/16 12:15 AM  Result Value Ref Range Status   Specimen Description BLOOD PORT  Final   Special Requests   Final    BOTTLES DRAWN AEROBIC AND ANAEROBIC Blood Culture adequate  volume   Culture  Setup Time   Final    IN BOTH AEROBIC AND ANAEROBIC BOTTLES GRAM POSITIVE COCCI IN CLUSTERS CRITICAL RESULT CALLED TO, READ BACK BY AND VERIFIED WITH: PHARMD E JACKSON [phone] MLM    Culture (A)  Final    STAPHYLOCOCCUS EPIDERMIDIS SUSCEPTIBILITIES PERFORMED ON PREVIOUS CULTURE WITHIN THE LAST 5 DAYS. Performed at [facility], [place] [address], [place], [place] [address]    Report Status 08/09/2016 FINAL  Final  Blood Culture ID Panel (Reflexed)     Status: Abnormal   Collection Time: 08/05/16 12:15 AM  Result Value Ref Range Status   Enterococcus species NOT DETECTED NOT DETECTED Final   Listeria monocytogenes NOT DETECTED NOT DETECTED Final   Staphylococcus species DETECTED (A) NOT DETECTED Final    Comment: Methicillin (oxacillin) resistant coagulase negative staphylococcus. Possible blood culture contaminant (unless isolated from more than one blood culture draw or clinical case suggests pathogenicity). No antibiotic treatment is indicated for blood  culture contaminants. CRITICAL RESULT CALLED TO, READ BACK BY AND  VERIFIED WITH: Gustavo Lah [phone] MLM    Staphylococcus aureus NOT DETECTED NOT DETECTED Final   Methicillin resistance DETECTED (A) NOT DETECTED Final    Comment: CRITICAL RESULT CALLED TO, READ BACK BY AND VERIFIED WITH: PHARMD E JACKSON [phone] MLM    Streptococcus species NOT DETECTED NOT DETECTED Final   Streptococcus agalactiae NOT DETECTED NOT DETECTED Final   Streptococcus pneumoniae NOT DETECTED NOT DETECTED Final   Streptococcus pyogenes NOT DETECTED NOT DETECTED Final   Acinetobacter baumannii NOT DETECTED NOT DETECTED Final   Enterobacteriaceae species NOT DETECTED NOT DETECTED Final   Enterobacter cloacae complex NOT DETECTED NOT DETECTED Final   Escherichia coli NOT DETECTED NOT DETECTED Final   Klebsiella oxytoca NOT DETECTED NOT DETECTED Final   Klebsiella pneumoniae NOT DETECTED NOT DETECTED Final   Proteus  species NOT DETECTED NOT DETECTED Final   Serratia marcescens NOT DETECTED NOT DETECTED Final   Haemophilus influenzae NOT DETECTED NOT DETECTED Final   Neisseria meningitidis NOT DETECTED NOT DETECTED Final   Pseudomonas aeruginosa NOT DETECTED NOT DETECTED Final   Candida albicans NOT DETECTED NOT DETECTED Final   Candida glabrata NOT DETECTED NOT DETECTED Final   Candida krusei NOT DETECTED NOT DETECTED Final   Candida parapsilosis NOT DETECTED NOT DETECTED Final   Candida tropicalis NOT DETECTED NOT DETECTED Final    Comment: Performed at [facility], [place]. [address], [place], [place] [address]  Blood culture (routine x 2)     Status: Abnormal   Collection Time: 08/05/16 11:17 PM  Result Value Ref Range Status   Specimen Description BLOOD RIGHT HAND  Final   Special Requests   Final    BOTTLES DRAWN AEROBIC AND ANAEROBIC Blood Culture adequate volume   Culture  Setup Time   Final    GRAM POSITIVE COCCI IN CLUSTERS IN BOTH AEROBIC AND ANAEROBIC BOTTLES CRITICAL RESULT CALLED TO, READ BACK BY AND VERIFIED WITH: E JACKSON,PHARMD AT 1226 08/07/16 BY L BENFIELD    Culture (A)  Final    STAPHYLOCOCCUS EPIDERMIDIS SUSCEPTIBILITIES PERFORMED ON PREVIOUS CULTURE WITHIN THE LAST 5 DAYS. Performed at [facility], [place] [address], [place], [place] [address]    Report Status 08/09/2016 FINAL  Final  Urine culture     Status: None   Collection Time: 08/06/16 11:40 AM  Result Value Ref Range Status   Specimen Description URINE, RANDOM  Final   Special Requests NONE  Final   Culture   Final    NO GROWTH Performed at [facility], [place] [address], [place], [place] [address]    Report Status 08/07/2016 FINAL  Final  Culture, blood (routine x 2)     Status: None (Preliminary result)   Collection Time: 08/10/16 12:16 AM  Result Value Ref Range Status   Specimen Description BLOOD BLOOD RIGHT HAND  Final   Special Requests IN PEDIATRIC BOTTLE Blood Culture adequate volume   Final   Culture   Final    NO GROWTH 2 DAYS Performed at [facility], [place] [address], [place],  [address]    Report Status PENDING  Incomplete  Culture, blood (routine x 2)     Status: None (Preliminary result)   Collection Time: 08/10/16 12:20 AM  Result Value Ref Range Status   Specimen Description BLOOD BLOOD RIGHT HAND  Final   Special Requests IN PEDIATRIC BOTTLE Blood Culture adequate volume  Final   Culture   Final    NO GROWTH  2 DAYS Performed at [facility], [place] [address] [place], [place] [address]    Report Status PENDING  Incomplete    BRIEF ADMITTING H & P: AUTHOR HATLESTAD is a 36 y.o. male with history of sickle cell anemia, pulmonary hypertension, pulmonary embolism was advised to come to the ER after patient blood cultures done and 08/05/2078 was showing MRSA bacteremia. Prior to this patient had blood cultures done at [facility] which showed staph bacteremia. Patient has such denies any fever or chills productive cough dysuria discharges or diarrhea. Patient does have pain characteristic of his sickle cell anemia. Pain is mostly in the ribs.   ED Course: In the ER labs reveal acute renal failure. Chest x-ray shows mild congestion. Patient was started on vancomycin and admitted for further management of MRSA bacteremia and also on IV fluids for acute renal failure. Urine analysis is pending.    Hospital Course:  Present on Admission: . Chronic respiratory failure with hypoxia ([place]) . PAH (pulmonary artery hypertension) ([place]) . PE (pulmonary thromboembolism) ([place]) . Sickle cell anemia (HCC) . AKI (acute kidney injury) ([place]) . Coagulase negative Staphylococcus bacteremia . 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 On admission blood cultures were done both  from the port and from the periphery. All of which resulted with positive staph epidermidis. The patient was started on IV vancomycin on 08/06/2016. He is continue IV vancomycin. Infectious diseases consult and agreed with the therapy. It was felt that the source of the infection is his port and infectious disease advised that the port be removed. However in light of the patient's other complications which includes endocarditis I have discussed this with the physicians at [facility] and the portal remain in until the patient is transferred to [facility] at that time they will proceed with port removal and alternative IV access. The patient would need a total of 6 weeks of IV vancomycin therapy.   Given the findings of the echocardiogram on his previous admission which were inconsistent with the findings and the current echocardiogram transesophageal echocardiogram is ordered. Patient underwent TEE and was found to have a large vegetation on the mitral valve (2.61.6 cm) with mild mitral regurgitation in addition to severe tricuspid regurgitation with mild coaptation of the tricuspid valve lead left with possible perforated septal leaflet. There was also possible PFO. In light of these findings this was discussed with cardiology. It was felt that given the patient's complex medical condition and the fact that his hematologist and pulmonologist are both at [facility] it was best to transfer the patient to [facility] for further surgical intervention. I spoke with Dr. Marney Setting cardiovascular surgery [facility] in the patient's been accepted for transfer. Currently we are awaiting a bed at [facility] to transfer the patient with the expectation that surgical intervention will occur around the first of next week.  Patient also has recurrent pulmonary emboli is currently on Xarelto for chronic anticoagulation therapy.  Patient has chronic respiratory failure  with hypoxemia and has a baseline oxygen requirement is 3 L/m. Currently he is at baseline for his oxygen use. This is felt to be secondary to the pulmonary arterial hypertension as well as recurrent pulmonary emboli.  Patient has a known anemia of chronic disease and given all his comorbidities  that goes to keep his hemoglobin levels at around 8. During his hospitalization the patient was transfused one unit of rbc's and at the time of discharge his hemoglobin is 7.7 g/dL. At this time the patient is asymptomatic and further transfusions will be deferred until he is transferred to [facility] on [address] is a acute change in his condition.  During this hospitalization the patient had an increase in his creatinine which is felt to be multifactorial owing to the use of Toradol as well as vancomycin. The patient was given IV hydration and lisinopril was held. With these interventions he had a resolution back to baseline creatinine.   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 He complained of pain during this hospitalization. A hemoglobin electrophoresis shows the patient's percentage of hemoglobin S was 30%. Thus the contribution of pain from his sickle cell is negligible.  Patient has chronic pain syndrome and during his hospitalization he was continued on his current medications which include MS Contin tablet gabapentin and oral Dilaudid on a when necessary basis.  Patient has a history of multiple CVAs and was continued on his aspirin as prescribed.     Disposition and Follow-up: Patient is being transferred to [facility] and has been accepted by Dr. Marney Setting.   DISCHARGE EXAM: This is a preliminary Summary of hospital course in anticipation of patient being transferred to [facility] this Weekend. Dr, Barbette Merino is will be the rounding attending Physician and an examination in addition to the remainder of patients clinical course will be documented  by Dr. Jonelle Sidle at time of transfer this weekend.   Recent Labs  08/11/16 0425 08/12/16 0442  NA 137 135  K 3.3* 3.7  CL 107 106  CO2 22 24  GLUCOSE 126* 108*  BUN 9 10  CREATININE 0.88 0.83  CALCIUM 8.3* 8.4*   No results for input(s): AST, ALT, ALKPHOS, BILITOT, PROT, ALBUMIN in the last 72 hours. No results for input(s): LIPASE, AMYLASE in the last 72 hours.  Recent Labs  08/11/16 0425 08/11/16 1610  WBC 16.7* 13.6*  NEUTROABS 12.8* 10.2*  HGB 7.8* 7.7*  HCT 22.4* 23.1*  MCV 87.5 88.2  PLT 568* 528*       Signed: [name],[name] 08/12/2016, 3:49 PM

## 2016-08-13 LAB — CREATININE, SERUM
Creatinine, Ser: 0.99 mg/dL (ref 0.61–1.24)
GFR calc non Af Amer: >60 mL/min (ref >60)

## 2016-08-13 LAB — VANCOMYCIN, TROUGH: VANCOMYCIN TR: 16 ug/mL (ref 15–20)

## 2016-08-13 MED ORDER — VANCOMYCIN HCL 10 G IV SOLR
1250.0000 mg | Freq: Two times a day (BID) | INTRAVENOUS | Status: DC
  Administered 2016-08-13 (×2): 1250 mg via INTRAVENOUS
  Filled 2016-08-13 (×3): qty 1250

## 2016-08-13 NOTE — Progress Notes (Signed)
Pharmacy Antibiotic Note  [name] is a 36 y.o. male admitted on 08/05/2016 with bacteremia and TEE with poss large vegetation.  Pharmacy has been consulted for Vancomycin dosing.  VT drawn a little early this am so most likely closer to 13-14. SCr stable, CrCl > 16ml/min.  Plan: Increase vancomycin to 1,250mg  IV Q12h Monitor clinical picture, renal function, VT prn F/U C&S, abx deescalation / LOT   Height: 6' (182.9 cm) Weight: 179 lb 4.8 oz (81.3 kg) IBW/kg (Calculated) : 77.6  Temp (24hrs), Avg:99.8 F (37.7 C), Min:98.8 F (37.1 C), Max:100.5 F (38.1 C)   Recent Labs Lab 08/08/16 0334 08/09/16 0519 08/09/16 1046 08/09/16 2031 08/10/16 0308 08/11/16 0425 08/11/16 1610 08/12/16 0442 08/13/16 0529  WBC 18.5*  --  23.9*  --   --  16.7* 13.6*  --   --   CREATININE 0.94 0.83  --   --  0.88 0.88  --  0.83 0.99  VANCOTROUGH  --   --   --  22*  --   --   --   --  16    Estimated Creatinine Clearance: 112.1 mL/min (by C-G formula based on SCr of 0.99 mg/dL).    No Known Allergies  Antimicrobials this admission:  7/7 vanc >>  Dose adjustments this admission:  7/8: inc vanc to 1250mg  q12h 7/9: inc vanc to 1g q8 hr 7/10 VT at 19:30 = 22  on 1g q8h. Change to 1000 mg IV q12h  7/14 VT = 16 (drawn early; most likely close to 13)  Microbiology results:  7/5 bcx x1: 2/2 bottles MRSE - cx from port 7/6  BCx x2: 2/2 MRSE (4/4 bottles) - cx from port and peripheral 7/7 ucx: NGF 7/11  blood ntd  Thank you for allowing pharmacy to be a part of this patient's care.  Elenor Quinones, PharmD, BCPS Clinical Pharmacist Pager [phone] 08/13/2016 6:33 AM

## 2016-08-13 NOTE — Progress Notes (Signed)
Patient notified RN that his scheduled Morphine 30 mg PO and Dilaudid 4 mg PO were not relieving his chest and rib pain of 7/10.  RN notified on-call NP Lynch and additional dose of Dilaudid 4 mg PO ordered.  When RN went into room to give the patient this medication patient was sleeping.  Will continue to monitor patient and notify as needed.

## 2016-08-13 NOTE — Progress Notes (Signed)
Patient asking for more pain medicine, different than Dilaudid. Per MD, patient can have only Morphine scheduled and Dilaudid PRN Q$hrs. Will continue to monitor.

## 2016-08-13 NOTE — Progress Notes (Signed)
Subjective: Patient is awaiting transfer to [facility]. He has significant Staphylococcus epidermidis endocarditis. He still complained of pain at 5 out of 10 in his back and 8 out of 10 in his limbs. No fever today no nausea vomiting or diarrhea. His MAXIMUM TEMPERATURE currently is 100.5. His last white count was 13,000.  Objective: Vital signs in last 24 hours: Temp:  [98.8 F (37.1 C)-100.5 F (38.1 C)] 98.8 F (37.1 C) (07/14 0604) Pulse Rate:  [78-90] 78 (07/14 0604) Resp:  [17-18] 18 (07/14 0604) BP: (106-114)/(68-76) 113/72 (07/14 0604) SpO2:  [93 %-96 %] 93 % (07/14 0604) Weight:  [81.3 kg (179 lb 4.8 oz)] 81.3 kg (179 lb 4.8 oz) (07/14 0604) Weight change: -9.389 kg (-20 lb 11.2 oz) Last BM Date: 08/12/16  Intake/Output from previous day: 07/13 0701 - 07/14 0700 In: 1127.2 [P.O.:600; I.V.:127.2; IV Piggyback:400] Out: 2050 [Urine:2050] Intake/Output this shift: Total I/O In: 310 [P.O.:60; IV Piggyback:250] Out: -   General appearance: alert, cooperative and no distress Head: Normocephalic, without obvious abnormality, atraumatic Throat: lips, mucosa, and tongue normal; teeth and gums normal Neck: no adenopathy, no carotid bruit, no JVD, supple, symmetrical, trachea midline and thyroid not enlarged, symmetric, no tenderness/mass/nodules Back: symmetric, no curvature. ROM normal. No CVA tenderness. Chest wall: no tenderness Cardio: regular rate and rhythm, S1, S2 normal, no murmur, click, rub or gallop GI: soft, non-tender; bowel sounds normal; no masses,  no organomegaly Extremities: extremities normal, atraumatic, no cyanosis or edema Pulses: 2+ and symmetric Skin: Skin color, texture, turgor normal. No rashes or lesions Neurologic: Grossly normal  Lab Results:  Recent Labs  08/11/16 0425 08/11/16 1610  WBC 16.7* 13.6*  HGB 7.8* 7.7*  HCT 22.4* 23.1*  PLT 568* 528*   BMET  Recent Labs  08/11/16 0425 08/12/16 0442 08/13/16 0529  NA  137 135  --   K 3.3* 3.7  --   CL 107 106  --   CO2 22 24  --   GLUCOSE 126* 108*  --   BUN 9 10  --   CREATININE 0.88 0.83 0.99  CALCIUM 8.3* 8.4*  --     Studies/Results: No results found.  Medications: I have reviewed the patient's current medications.  Assessment/Plan: 36 year old gentleman was known history of sickle cell disease here with sickle cell painful crisis but also bacteremia secondary to staphylococcal epidermidis with mitral valve and probable tricuspid valve vegetation.  1. S. Epidermidis Endocarditis: Patient will need 6 weeks of IV vancomycin and most likely will need surgery at [facility]. Appreciate infectious disease input. Recommendation is to leave the port in place as there is no evidence of infection with the port. His latest blood culture is negative 2. Cardiac Valvular Disease: Patient hemodynamically stable at this point. Pt has mild MVR and severe TVR and possible perforation of septal TV leaflet and malcoaptation. Pt has been accepted in transfer to [facility] by Dr. Marney Setting for surgical intervention.   he will be transferred as soon as a bed is available. 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 He was being followed by cardiology for his pulmonary hypertension. Pt  Has resultant hypoxia and is on 3 L/min of Oxygen. Currently at baseline.  4. Chronic Anticoagulation due to recurrent PE: Stable at this point. Continue Xarelto.  5. Chronic Respiratory Failure with Hypoxia: Secondary to [place] and recurrent Hypoxia. Most of it related to chronic recurrent pulmonary  emboli. Patient has been on home oxygen. He is currently at baseline. 6. Anemia of Chronic Disease: Hb currently at baseline after transfusion of 1 unit RBC's. We will recheck prior to discharge to [facility]. 7. 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 Thus the contribution to his pain from SCD is neglegeble if any. He has chronic  pain syndrome 8. Chronic Pain Syndrome: Continue MS Contin and Gabapentin.   9. AKI:  Resolved after fluid resuscitation.  10. Hypokalemia: resolved with replacement.  11. S/P CVA: Continue ASA 12. Essential Hypertension: Well controlled. Had been on Lisinopril which was held due to AKI.    LOS: 7 days   Kjerstin Abrigo,[name] 08/13/2016, 9:53 AM

## 2016-08-14 DIAGNOSIS — I2782 Chronic pulmonary embolism: Secondary | ICD-10-CM | POA: Diagnosis not present

## 2016-08-14 DIAGNOSIS — Z86711 Personal history of pulmonary embolism: Secondary | ICD-10-CM | POA: Diagnosis not present

## 2016-08-14 DIAGNOSIS — Z952 Presence of prosthetic heart valve: Secondary | ICD-10-CM | POA: Diagnosis not present

## 2016-08-14 DIAGNOSIS — J939 Pneumothorax, unspecified: Secondary | ICD-10-CM | POA: Diagnosis not present

## 2016-08-14 DIAGNOSIS — Z452 Encounter for adjustment and management of vascular access device: Secondary | ICD-10-CM | POA: Diagnosis not present

## 2016-08-14 DIAGNOSIS — I071 Rheumatic tricuspid insufficiency: Secondary | ICD-10-CM | POA: Diagnosis not present

## 2016-08-14 DIAGNOSIS — Q211 Atrial septal defect: Secondary | ICD-10-CM | POA: Diagnosis not present

## 2016-08-14 DIAGNOSIS — D62 Acute posthemorrhagic anemia: Secondary | ICD-10-CM | POA: Diagnosis not present

## 2016-08-14 DIAGNOSIS — J9610 Chronic respiratory failure, unspecified whether with hypoxia or hypercapnia: Secondary | ICD-10-CM | POA: Diagnosis present

## 2016-08-14 DIAGNOSIS — E8809 Other disorders of plasma-protein metabolism, not elsewhere classified: Secondary | ICD-10-CM | POA: Diagnosis not present

## 2016-08-14 DIAGNOSIS — Z8673 Personal history of transient ischemic attack (TIA), and cerebral infarction without residual deficits: Secondary | ICD-10-CM | POA: Diagnosis not present

## 2016-08-14 DIAGNOSIS — E561 Deficiency of vitamin K: Secondary | ICD-10-CM | POA: Diagnosis present

## 2016-08-14 DIAGNOSIS — M79671 Pain in right foot: Secondary | ICD-10-CM | POA: Diagnosis not present

## 2016-08-14 DIAGNOSIS — J811 Chronic pulmonary edema: Secondary | ICD-10-CM | POA: Diagnosis not present

## 2016-08-14 DIAGNOSIS — I639 Cerebral infarction, unspecified: Secondary | ICD-10-CM | POA: Diagnosis not present

## 2016-08-14 DIAGNOSIS — I517 Cardiomegaly: Secondary | ICD-10-CM | POA: Diagnosis not present

## 2016-08-14 DIAGNOSIS — R7881 Bacteremia: Secondary | ICD-10-CM | POA: Diagnosis not present

## 2016-08-14 DIAGNOSIS — G8929 Other chronic pain: Secondary | ICD-10-CM | POA: Diagnosis present

## 2016-08-14 DIAGNOSIS — I058 Other rheumatic mitral valve diseases: Secondary | ICD-10-CM | POA: Diagnosis not present

## 2016-08-14 DIAGNOSIS — R0689 Other abnormalities of breathing: Secondary | ICD-10-CM | POA: Diagnosis not present

## 2016-08-14 DIAGNOSIS — J189 Pneumonia, unspecified organism: Secondary | ICD-10-CM | POA: Diagnosis not present

## 2016-08-14 DIAGNOSIS — G8918 Other acute postprocedural pain: Secondary | ICD-10-CM | POA: Diagnosis not present

## 2016-08-14 DIAGNOSIS — Z4682 Encounter for fitting and adjustment of non-vascular catheter: Secondary | ICD-10-CM | POA: Diagnosis not present

## 2016-08-14 DIAGNOSIS — Z9981 Dependence on supplemental oxygen: Secondary | ICD-10-CM | POA: Diagnosis not present

## 2016-08-14 DIAGNOSIS — I361 Nonrheumatic tricuspid (valve) insufficiency: Secondary | ICD-10-CM | POA: Diagnosis not present

## 2016-08-14 DIAGNOSIS — N179 Acute kidney failure, unspecified: Secondary | ICD-10-CM | POA: Diagnosis not present

## 2016-08-14 DIAGNOSIS — J9811 Atelectasis: Secondary | ICD-10-CM | POA: Diagnosis not present

## 2016-08-14 DIAGNOSIS — Z79899 Other long term (current) drug therapy: Secondary | ICD-10-CM | POA: Diagnosis not present

## 2016-08-14 DIAGNOSIS — Z7982 Long term (current) use of aspirin: Secondary | ICD-10-CM | POA: Diagnosis not present

## 2016-08-14 DIAGNOSIS — Z86718 Personal history of other venous thrombosis and embolism: Secondary | ICD-10-CM | POA: Diagnosis not present

## 2016-08-14 DIAGNOSIS — I4891 Unspecified atrial fibrillation: Secondary | ICD-10-CM | POA: Diagnosis not present

## 2016-08-14 DIAGNOSIS — I330 Acute and subacute infective endocarditis: Secondary | ICD-10-CM | POA: Diagnosis present

## 2016-08-14 DIAGNOSIS — D571 Sickle-cell disease without crisis: Secondary | ICD-10-CM | POA: Diagnosis present

## 2016-08-14 DIAGNOSIS — J90 Pleural effusion, not elsewhere classified: Secondary | ICD-10-CM | POA: Diagnosis not present

## 2016-08-14 DIAGNOSIS — R262 Difficulty in walking, not elsewhere classified: Secondary | ICD-10-CM | POA: Diagnosis not present

## 2016-08-14 DIAGNOSIS — R918 Other nonspecific abnormal finding of lung field: Secondary | ICD-10-CM | POA: Diagnosis not present

## 2016-08-14 DIAGNOSIS — B957 Other staphylococcus as the cause of diseases classified elsewhere: Secondary | ICD-10-CM | POA: Diagnosis present

## 2016-08-14 DIAGNOSIS — I9589 Other hypotension: Secondary | ICD-10-CM | POA: Diagnosis not present

## 2016-08-14 DIAGNOSIS — Z79891 Long term (current) use of opiate analgesic: Secondary | ICD-10-CM | POA: Diagnosis not present

## 2016-08-14 DIAGNOSIS — R570 Cardiogenic shock: Secondary | ICD-10-CM | POA: Diagnosis not present

## 2016-08-14 DIAGNOSIS — I509 Heart failure, unspecified: Secondary | ICD-10-CM | POA: Diagnosis not present

## 2016-08-14 DIAGNOSIS — I272 Pulmonary hypertension, unspecified: Secondary | ICD-10-CM | POA: Diagnosis present

## 2016-08-14 DIAGNOSIS — Z7901 Long term (current) use of anticoagulants: Secondary | ICD-10-CM | POA: Diagnosis not present

## 2016-08-14 DIAGNOSIS — Z87891 Personal history of nicotine dependence: Secondary | ICD-10-CM | POA: Diagnosis not present

## 2016-08-14 DIAGNOSIS — Z96641 Presence of right artificial hip joint: Secondary | ICD-10-CM | POA: Diagnosis present

## 2016-08-14 DIAGNOSIS — J810 Acute pulmonary edema: Secondary | ICD-10-CM | POA: Diagnosis not present

## 2016-08-14 NOTE — Progress Notes (Signed)
Patient transferred to [facility] room 3321 from [facility] room [phone].  Transported via carelink with all patient belongings.  Vital signs stable.  Patient records sent with patient via [place].

## 2016-08-14 NOTE — Discharge Summary (Cosign Needed)
Rn reports pt accepted to [facility].  Dr. Marney Setting Rm [phone].

## 2016-08-15 DIAGNOSIS — Q211 Atrial septal defect: Secondary | ICD-10-CM | POA: Diagnosis not present

## 2016-08-15 DIAGNOSIS — I330 Acute and subacute infective endocarditis: Secondary | ICD-10-CM | POA: Diagnosis not present

## 2016-08-15 DIAGNOSIS — I361 Nonrheumatic tricuspid (valve) insufficiency: Secondary | ICD-10-CM | POA: Diagnosis not present

## 2016-08-15 DIAGNOSIS — I9589 Other hypotension: Secondary | ICD-10-CM | POA: Diagnosis not present

## 2016-08-15 DIAGNOSIS — I2782 Chronic pulmonary embolism: Secondary | ICD-10-CM | POA: Diagnosis not present

## 2016-08-15 DIAGNOSIS — I272 Pulmonary hypertension, unspecified: Secondary | ICD-10-CM | POA: Diagnosis not present

## 2016-08-15 DIAGNOSIS — J811 Chronic pulmonary edema: Secondary | ICD-10-CM | POA: Diagnosis not present

## 2016-08-15 DIAGNOSIS — D571 Sickle-cell disease without crisis: Secondary | ICD-10-CM | POA: Diagnosis not present

## 2016-08-15 DIAGNOSIS — Z952 Presence of prosthetic heart valve: Secondary | ICD-10-CM | POA: Diagnosis not present

## 2016-08-15 DIAGNOSIS — R918 Other nonspecific abnormal finding of lung field: Secondary | ICD-10-CM | POA: Diagnosis not present

## 2016-08-15 DIAGNOSIS — I509 Heart failure, unspecified: Secondary | ICD-10-CM | POA: Diagnosis not present

## 2016-08-15 DIAGNOSIS — I058 Other rheumatic mitral valve diseases: Secondary | ICD-10-CM | POA: Diagnosis not present

## 2016-08-15 DIAGNOSIS — I071 Rheumatic tricuspid insufficiency: Secondary | ICD-10-CM | POA: Diagnosis not present

## 2016-08-15 DIAGNOSIS — B957 Other staphylococcus as the cause of diseases classified elsewhere: Secondary | ICD-10-CM | POA: Diagnosis not present

## 2016-08-15 DIAGNOSIS — Z452 Encounter for adjustment and management of vascular access device: Secondary | ICD-10-CM | POA: Diagnosis not present

## 2016-08-15 LAB — CULTURE, BLOOD (ROUTINE X 2)
CULTURE: NO GROWTH
Culture: NO GROWTH
Special Requests: ADEQUATE
Special Requests: ADEQUATE

## 2016-08-15 IMAGING — CR DG CHEST 2V
2 series · 2 of 2 positions shown · non-contrast
Comparison: August 28, 2013.

CLINICAL DATA: Chest pain.

EXAM:
CHEST  2 VIEW

[w chest pa]
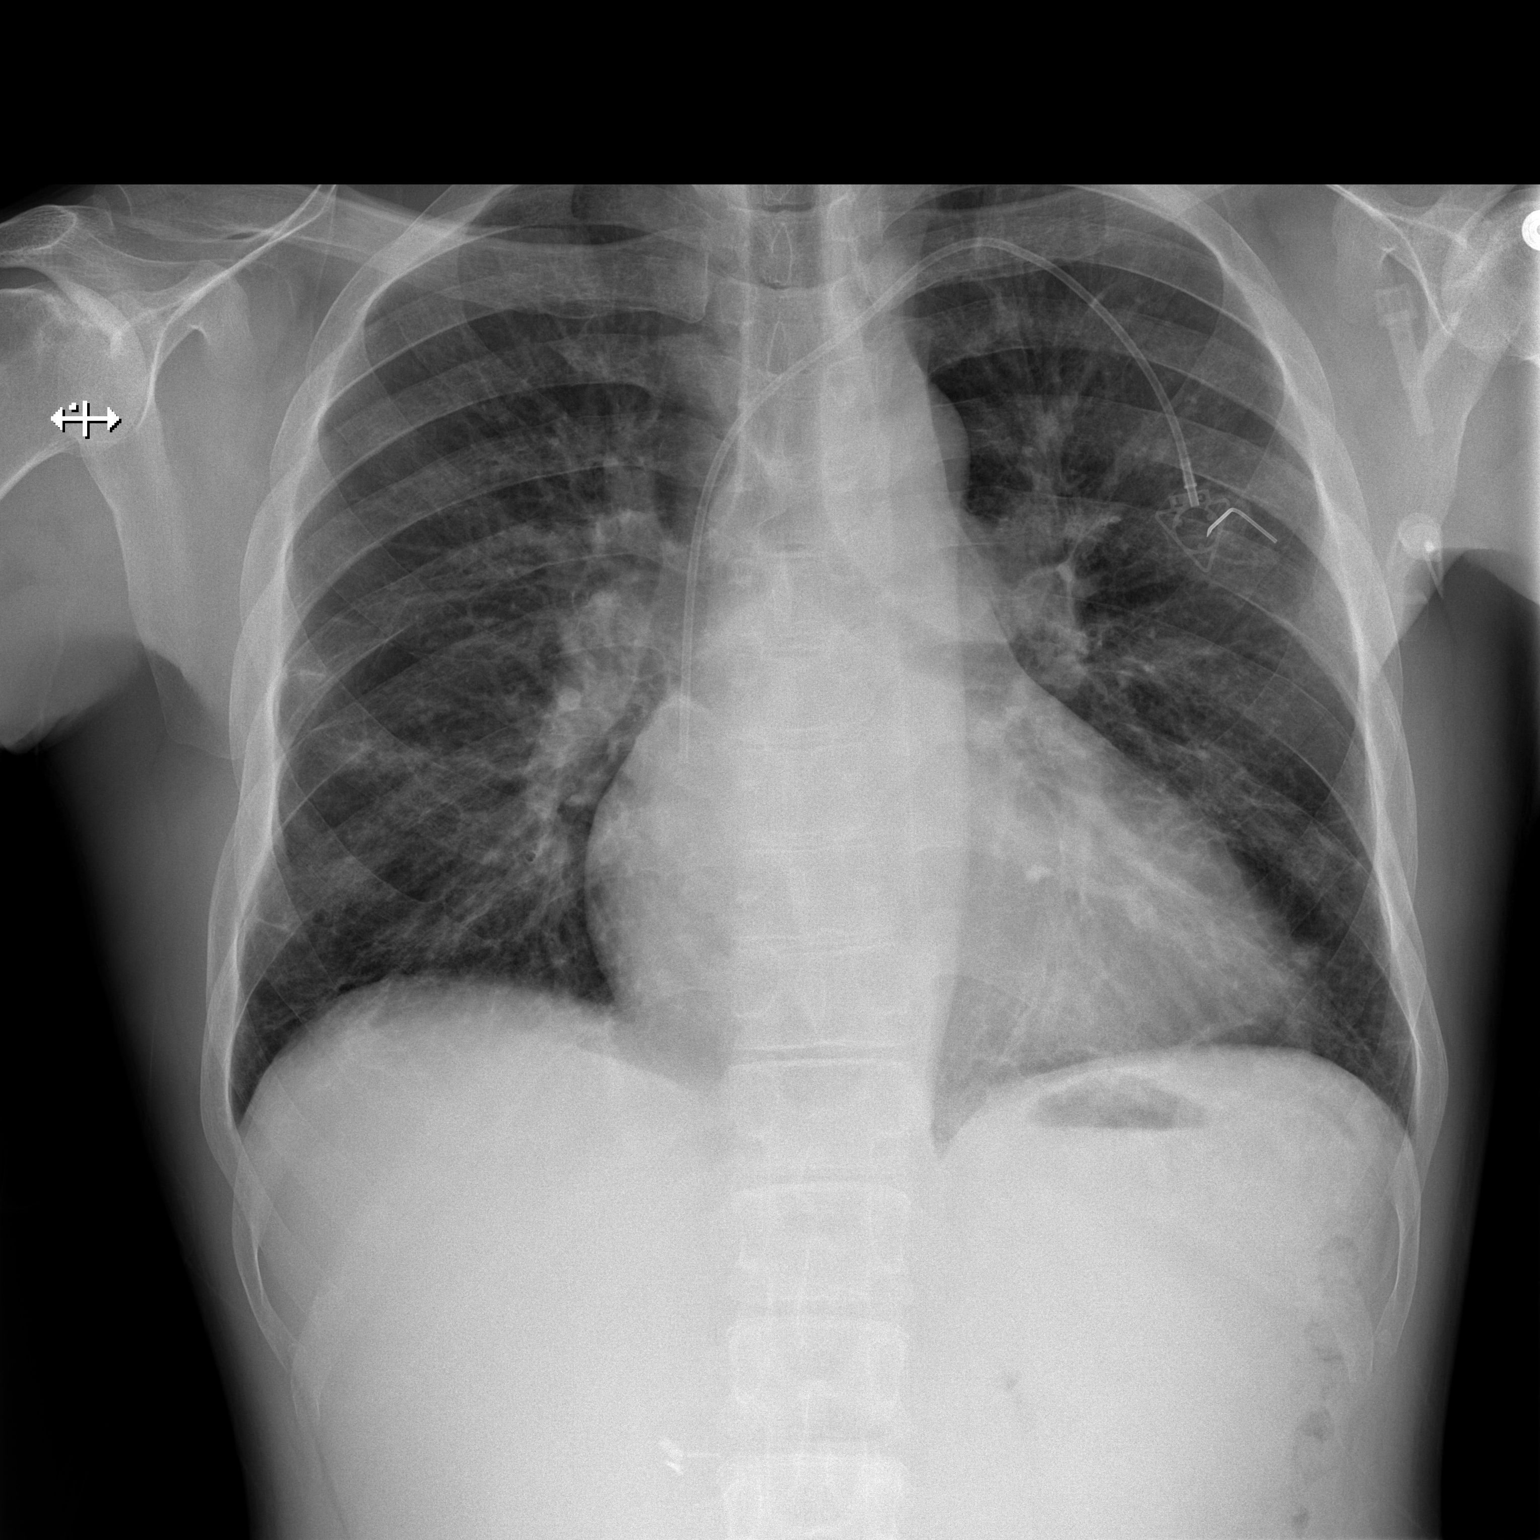

[w chest lat]
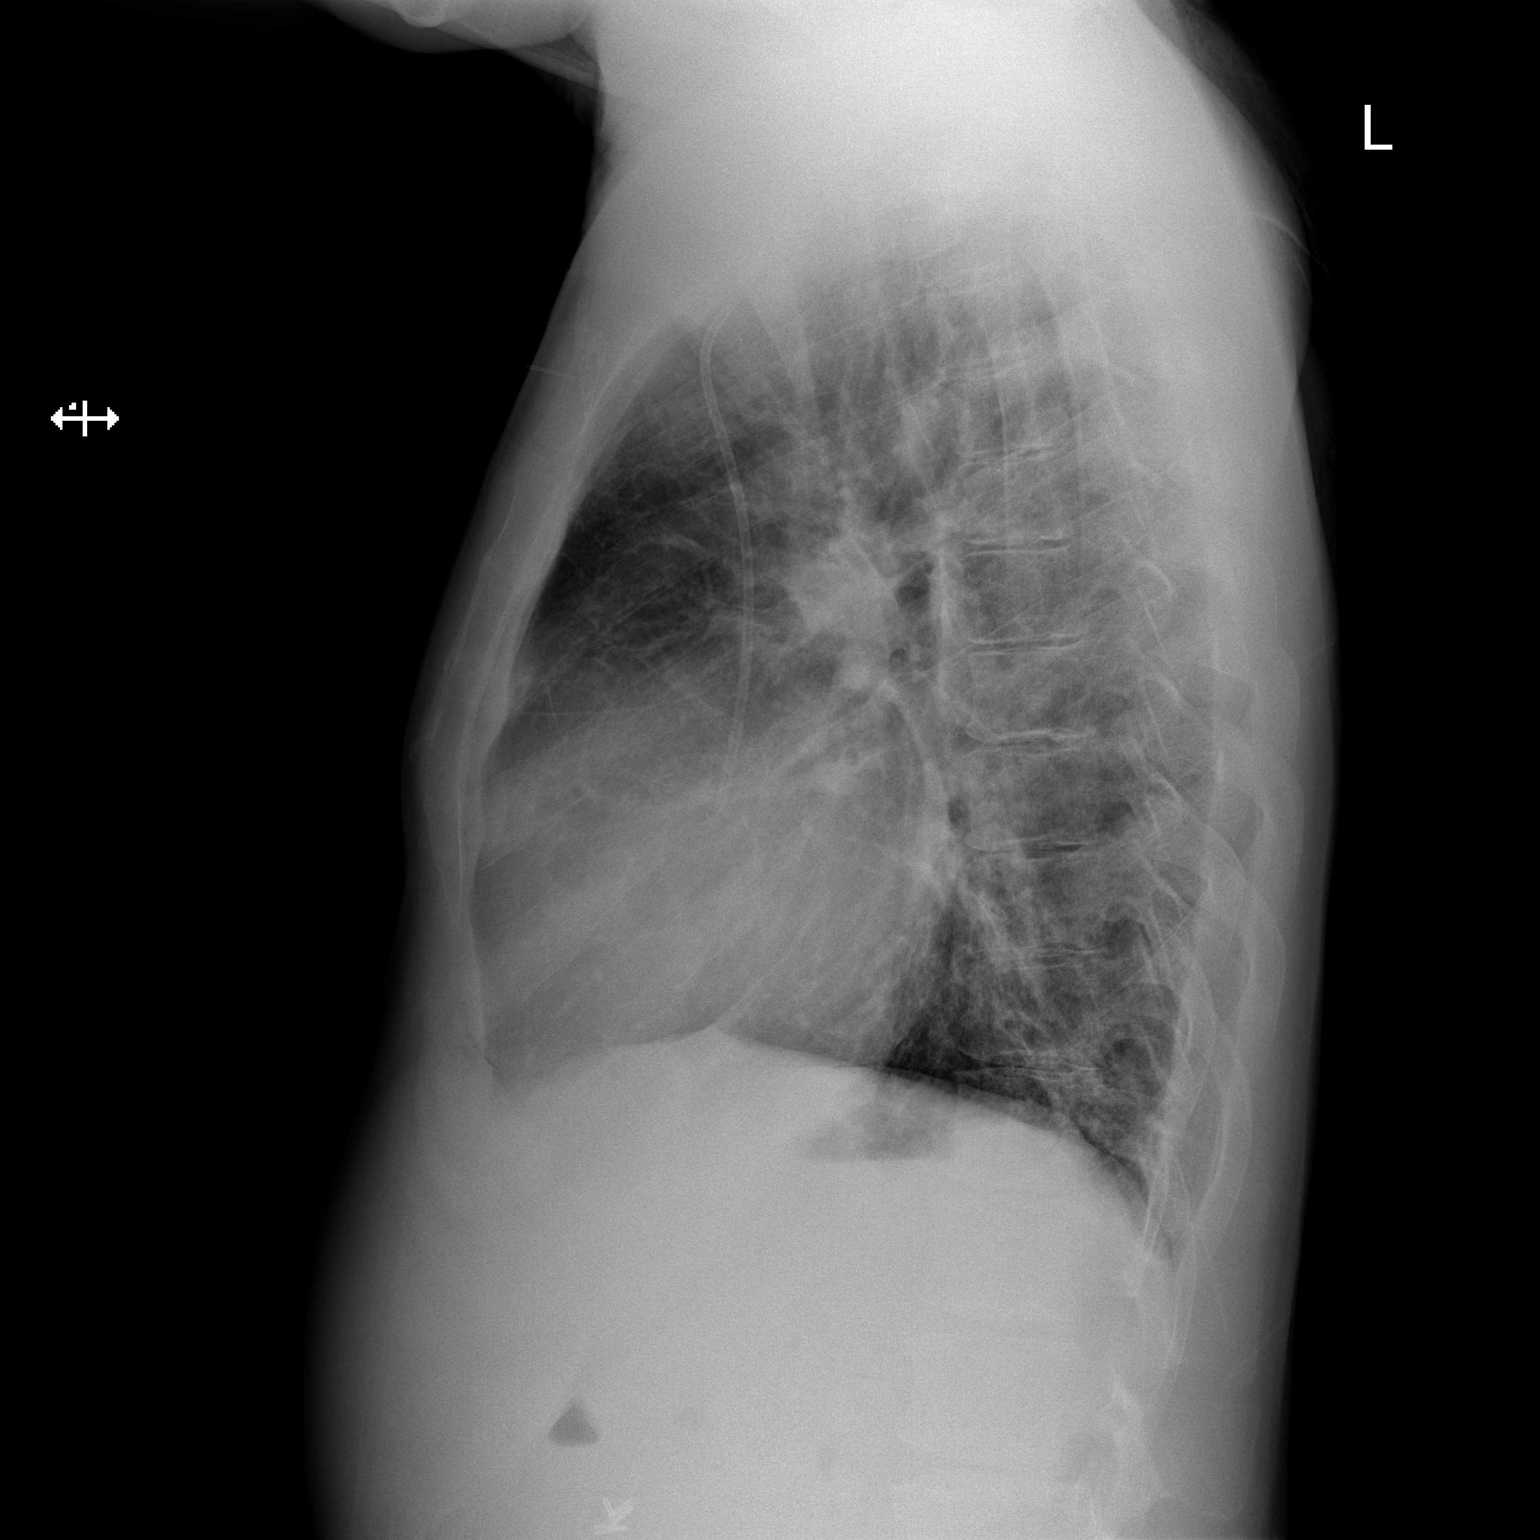

[2 of 2 positions shown; findings below may reference images not displayed]

FINDINGS: Stable cardiomediastinal silhouette. Left subclavian Port-A-Cath is
noted with distal tip in expected position of the SVC. No
pneumothorax is noted. Stable interstitial densities are noted
throughout both lungs most consistent with scarring. No acute
pulmonary disease is noted. Bony thorax is intact.
IMPRESSION: No acute cardiopulmonary abnormality seen.

## 2016-08-16 DIAGNOSIS — R570 Cardiogenic shock: Secondary | ICD-10-CM | POA: Diagnosis not present

## 2016-08-16 DIAGNOSIS — I058 Other rheumatic mitral valve diseases: Secondary | ICD-10-CM | POA: Diagnosis not present

## 2016-08-16 DIAGNOSIS — R7881 Bacteremia: Secondary | ICD-10-CM | POA: Diagnosis not present

## 2016-08-16 DIAGNOSIS — E561 Deficiency of vitamin K: Secondary | ICD-10-CM | POA: Diagnosis not present

## 2016-08-16 DIAGNOSIS — R0689 Other abnormalities of breathing: Secondary | ICD-10-CM | POA: Diagnosis not present

## 2016-08-16 DIAGNOSIS — I330 Acute and subacute infective endocarditis: Secondary | ICD-10-CM | POA: Diagnosis not present

## 2016-08-16 DIAGNOSIS — R918 Other nonspecific abnormal finding of lung field: Secondary | ICD-10-CM | POA: Diagnosis not present

## 2016-08-16 DIAGNOSIS — Z952 Presence of prosthetic heart valve: Secondary | ICD-10-CM | POA: Diagnosis not present

## 2016-08-16 DIAGNOSIS — D62 Acute posthemorrhagic anemia: Secondary | ICD-10-CM | POA: Diagnosis not present

## 2016-08-16 DIAGNOSIS — J811 Chronic pulmonary edema: Secondary | ICD-10-CM | POA: Diagnosis not present

## 2016-08-16 DIAGNOSIS — I272 Pulmonary hypertension, unspecified: Secondary | ICD-10-CM | POA: Diagnosis not present

## 2016-08-16 DIAGNOSIS — G8918 Other acute postprocedural pain: Secondary | ICD-10-CM | POA: Diagnosis not present

## 2016-08-16 DIAGNOSIS — D571 Sickle-cell disease without crisis: Secondary | ICD-10-CM | POA: Diagnosis not present

## 2016-08-17 DIAGNOSIS — R918 Other nonspecific abnormal finding of lung field: Secondary | ICD-10-CM | POA: Diagnosis not present

## 2016-08-17 DIAGNOSIS — I330 Acute and subacute infective endocarditis: Secondary | ICD-10-CM | POA: Diagnosis not present

## 2016-08-17 DIAGNOSIS — I058 Other rheumatic mitral valve diseases: Secondary | ICD-10-CM | POA: Diagnosis not present

## 2016-08-17 DIAGNOSIS — R7881 Bacteremia: Secondary | ICD-10-CM | POA: Diagnosis not present

## 2016-08-17 DIAGNOSIS — R0689 Other abnormalities of breathing: Secondary | ICD-10-CM | POA: Diagnosis not present

## 2016-08-17 DIAGNOSIS — D62 Acute posthemorrhagic anemia: Secondary | ICD-10-CM | POA: Diagnosis not present

## 2016-08-17 DIAGNOSIS — R570 Cardiogenic shock: Secondary | ICD-10-CM | POA: Diagnosis not present

## 2016-08-17 DIAGNOSIS — D571 Sickle-cell disease without crisis: Secondary | ICD-10-CM | POA: Diagnosis not present

## 2016-08-18 ENCOUNTER — Other Ambulatory Visit: Payer: Self-pay | Admitting: *Deleted

## 2016-08-18 DIAGNOSIS — J9811 Atelectasis: Secondary | ICD-10-CM | POA: Diagnosis not present

## 2016-08-18 DIAGNOSIS — J90 Pleural effusion, not elsewhere classified: Secondary | ICD-10-CM | POA: Diagnosis not present

## 2016-08-18 DIAGNOSIS — Z952 Presence of prosthetic heart valve: Secondary | ICD-10-CM | POA: Diagnosis not present

## 2016-08-18 DIAGNOSIS — I330 Acute and subacute infective endocarditis: Secondary | ICD-10-CM | POA: Diagnosis not present

## 2016-08-18 DIAGNOSIS — D62 Acute posthemorrhagic anemia: Secondary | ICD-10-CM | POA: Diagnosis not present

## 2016-08-18 DIAGNOSIS — R918 Other nonspecific abnormal finding of lung field: Secondary | ICD-10-CM | POA: Diagnosis not present

## 2016-08-18 DIAGNOSIS — R0689 Other abnormalities of breathing: Secondary | ICD-10-CM | POA: Diagnosis not present

## 2016-08-18 DIAGNOSIS — I058 Other rheumatic mitral valve diseases: Secondary | ICD-10-CM | POA: Diagnosis not present

## 2016-08-18 DIAGNOSIS — R570 Cardiogenic shock: Secondary | ICD-10-CM | POA: Diagnosis not present

## 2016-08-18 DIAGNOSIS — R7881 Bacteremia: Secondary | ICD-10-CM | POA: Diagnosis not present

## 2016-08-18 DIAGNOSIS — D571 Sickle-cell disease without crisis: Secondary | ICD-10-CM | POA: Diagnosis not present

## 2016-08-18 MED FILL — Hydromorphone HCl Inj 2 MG/ML: INTRAMUSCULAR | Qty: 1 | Status: AC

## 2016-08-18 NOTE — Patient Outreach (Signed)
Spoke with Dr. Zigmund Daniel. She indicates Mr. Diltz is currently at [facility] post cardiac surgery in ICU. States she understands Mr. Barnhart has declined [facility] on multiple occasions. However, she asks that writer attempt to re-engage him for [facility] due to his medical conditions. He is currently at [facility]. Therefore, Probation officer will attempt to follow up with him post discharge from [facility] and then re-refer to [facility] team.  Unable to follow up at this time due to being at another hospital. Confirmed best number to reach him is [phone].   Marthenia Rolling, MSN-Ed, RN,BSN [facility] Liaison [phone]

## 2016-08-19 DIAGNOSIS — R0689 Other abnormalities of breathing: Secondary | ICD-10-CM | POA: Diagnosis not present

## 2016-08-19 DIAGNOSIS — R918 Other nonspecific abnormal finding of lung field: Secondary | ICD-10-CM | POA: Diagnosis not present

## 2016-08-19 DIAGNOSIS — R570 Cardiogenic shock: Secondary | ICD-10-CM | POA: Diagnosis not present

## 2016-08-19 DIAGNOSIS — B957 Other staphylococcus as the cause of diseases classified elsewhere: Secondary | ICD-10-CM | POA: Diagnosis not present

## 2016-08-19 DIAGNOSIS — D571 Sickle-cell disease without crisis: Secondary | ICD-10-CM | POA: Diagnosis not present

## 2016-08-19 DIAGNOSIS — D62 Acute posthemorrhagic anemia: Secondary | ICD-10-CM | POA: Diagnosis not present

## 2016-08-19 DIAGNOSIS — I330 Acute and subacute infective endocarditis: Secondary | ICD-10-CM | POA: Diagnosis not present

## 2016-08-19 DIAGNOSIS — R7881 Bacteremia: Secondary | ICD-10-CM | POA: Diagnosis not present

## 2016-08-19 DIAGNOSIS — I058 Other rheumatic mitral valve diseases: Secondary | ICD-10-CM | POA: Diagnosis not present

## 2016-08-19 DIAGNOSIS — J189 Pneumonia, unspecified organism: Secondary | ICD-10-CM | POA: Diagnosis not present

## 2016-08-20 DIAGNOSIS — E561 Deficiency of vitamin K: Secondary | ICD-10-CM | POA: Diagnosis not present

## 2016-08-20 DIAGNOSIS — I330 Acute and subacute infective endocarditis: Secondary | ICD-10-CM | POA: Diagnosis not present

## 2016-08-20 DIAGNOSIS — D571 Sickle-cell disease without crisis: Secondary | ICD-10-CM | POA: Diagnosis not present

## 2016-08-20 DIAGNOSIS — R918 Other nonspecific abnormal finding of lung field: Secondary | ICD-10-CM | POA: Diagnosis not present

## 2016-08-20 DIAGNOSIS — J90 Pleural effusion, not elsewhere classified: Secondary | ICD-10-CM | POA: Diagnosis not present

## 2016-08-20 DIAGNOSIS — I272 Pulmonary hypertension, unspecified: Secondary | ICD-10-CM | POA: Diagnosis not present

## 2016-08-21 DIAGNOSIS — R570 Cardiogenic shock: Secondary | ICD-10-CM | POA: Diagnosis not present

## 2016-08-21 DIAGNOSIS — D62 Acute posthemorrhagic anemia: Secondary | ICD-10-CM | POA: Diagnosis not present

## 2016-08-21 DIAGNOSIS — R918 Other nonspecific abnormal finding of lung field: Secondary | ICD-10-CM | POA: Diagnosis not present

## 2016-08-21 DIAGNOSIS — R0689 Other abnormalities of breathing: Secondary | ICD-10-CM | POA: Diagnosis not present

## 2016-08-21 DIAGNOSIS — D571 Sickle-cell disease without crisis: Secondary | ICD-10-CM | POA: Diagnosis not present

## 2016-08-21 DIAGNOSIS — I330 Acute and subacute infective endocarditis: Secondary | ICD-10-CM | POA: Diagnosis not present

## 2016-08-21 DIAGNOSIS — J90 Pleural effusion, not elsewhere classified: Secondary | ICD-10-CM | POA: Diagnosis not present

## 2016-08-21 DIAGNOSIS — I058 Other rheumatic mitral valve diseases: Secondary | ICD-10-CM | POA: Diagnosis not present

## 2016-08-21 DIAGNOSIS — R7881 Bacteremia: Secondary | ICD-10-CM | POA: Diagnosis not present

## 2016-08-21 DIAGNOSIS — I272 Pulmonary hypertension, unspecified: Secondary | ICD-10-CM | POA: Diagnosis not present

## 2016-08-22 DIAGNOSIS — G8918 Other acute postprocedural pain: Secondary | ICD-10-CM | POA: Diagnosis not present

## 2016-08-22 DIAGNOSIS — J90 Pleural effusion, not elsewhere classified: Secondary | ICD-10-CM | POA: Diagnosis not present

## 2016-08-22 DIAGNOSIS — I272 Pulmonary hypertension, unspecified: Secondary | ICD-10-CM | POA: Diagnosis not present

## 2016-08-22 DIAGNOSIS — Z952 Presence of prosthetic heart valve: Secondary | ICD-10-CM | POA: Diagnosis not present

## 2016-08-23 DIAGNOSIS — Z952 Presence of prosthetic heart valve: Secondary | ICD-10-CM | POA: Diagnosis not present

## 2016-08-23 DIAGNOSIS — R918 Other nonspecific abnormal finding of lung field: Secondary | ICD-10-CM | POA: Diagnosis not present

## 2016-08-23 DIAGNOSIS — G8918 Other acute postprocedural pain: Secondary | ICD-10-CM | POA: Diagnosis not present

## 2016-08-24 DIAGNOSIS — G8918 Other acute postprocedural pain: Secondary | ICD-10-CM | POA: Diagnosis not present

## 2016-08-24 DIAGNOSIS — J9811 Atelectasis: Secondary | ICD-10-CM | POA: Diagnosis not present

## 2016-08-24 DIAGNOSIS — J939 Pneumothorax, unspecified: Secondary | ICD-10-CM | POA: Diagnosis not present

## 2016-08-24 DIAGNOSIS — B957 Other staphylococcus as the cause of diseases classified elsewhere: Secondary | ICD-10-CM | POA: Diagnosis not present

## 2016-08-24 DIAGNOSIS — R7881 Bacteremia: Secondary | ICD-10-CM | POA: Diagnosis not present

## 2016-08-24 DIAGNOSIS — I330 Acute and subacute infective endocarditis: Secondary | ICD-10-CM | POA: Diagnosis not present

## 2016-08-24 DIAGNOSIS — I058 Other rheumatic mitral valve diseases: Secondary | ICD-10-CM | POA: Diagnosis not present

## 2016-08-24 DIAGNOSIS — I272 Pulmonary hypertension, unspecified: Secondary | ICD-10-CM | POA: Diagnosis not present

## 2016-08-24 DIAGNOSIS — Z952 Presence of prosthetic heart valve: Secondary | ICD-10-CM | POA: Diagnosis not present

## 2016-08-24 DIAGNOSIS — Z4682 Encounter for fitting and adjustment of non-vascular catheter: Secondary | ICD-10-CM | POA: Diagnosis not present

## 2016-08-24 DIAGNOSIS — R918 Other nonspecific abnormal finding of lung field: Secondary | ICD-10-CM | POA: Diagnosis not present

## 2016-08-25 DIAGNOSIS — I639 Cerebral infarction, unspecified: Secondary | ICD-10-CM | POA: Diagnosis not present

## 2016-08-25 DIAGNOSIS — I272 Pulmonary hypertension, unspecified: Secondary | ICD-10-CM | POA: Diagnosis not present

## 2016-08-25 DIAGNOSIS — R7881 Bacteremia: Secondary | ICD-10-CM | POA: Diagnosis not present

## 2016-08-25 DIAGNOSIS — B957 Other staphylococcus as the cause of diseases classified elsewhere: Secondary | ICD-10-CM | POA: Diagnosis not present

## 2016-08-25 DIAGNOSIS — G8918 Other acute postprocedural pain: Secondary | ICD-10-CM | POA: Diagnosis not present

## 2016-08-25 DIAGNOSIS — Z952 Presence of prosthetic heart valve: Secondary | ICD-10-CM | POA: Diagnosis not present

## 2016-08-25 DIAGNOSIS — I330 Acute and subacute infective endocarditis: Secondary | ICD-10-CM | POA: Diagnosis not present

## 2016-08-25 DIAGNOSIS — I058 Other rheumatic mitral valve diseases: Secondary | ICD-10-CM | POA: Diagnosis not present

## 2016-08-25 DIAGNOSIS — D571 Sickle-cell disease without crisis: Secondary | ICD-10-CM | POA: Diagnosis not present

## 2016-08-26 DIAGNOSIS — I639 Cerebral infarction, unspecified: Secondary | ICD-10-CM | POA: Diagnosis not present

## 2016-08-26 DIAGNOSIS — Z952 Presence of prosthetic heart valve: Secondary | ICD-10-CM | POA: Diagnosis not present

## 2016-08-26 DIAGNOSIS — I058 Other rheumatic mitral valve diseases: Secondary | ICD-10-CM | POA: Diagnosis not present

## 2016-08-26 DIAGNOSIS — B957 Other staphylococcus as the cause of diseases classified elsewhere: Secondary | ICD-10-CM | POA: Diagnosis not present

## 2016-08-26 DIAGNOSIS — R7881 Bacteremia: Secondary | ICD-10-CM | POA: Diagnosis not present

## 2016-08-26 DIAGNOSIS — D571 Sickle-cell disease without crisis: Secondary | ICD-10-CM | POA: Diagnosis not present

## 2016-08-26 DIAGNOSIS — J811 Chronic pulmonary edema: Secondary | ICD-10-CM | POA: Diagnosis not present

## 2016-08-26 DIAGNOSIS — R918 Other nonspecific abnormal finding of lung field: Secondary | ICD-10-CM | POA: Diagnosis not present

## 2016-08-26 DIAGNOSIS — G8918 Other acute postprocedural pain: Secondary | ICD-10-CM | POA: Diagnosis not present

## 2016-08-26 DIAGNOSIS — I330 Acute and subacute infective endocarditis: Secondary | ICD-10-CM | POA: Diagnosis not present

## 2016-08-26 DIAGNOSIS — Z4682 Encounter for fitting and adjustment of non-vascular catheter: Secondary | ICD-10-CM | POA: Diagnosis not present

## 2016-08-26 DIAGNOSIS — I272 Pulmonary hypertension, unspecified: Secondary | ICD-10-CM | POA: Diagnosis not present

## 2016-08-27 DIAGNOSIS — J811 Chronic pulmonary edema: Secondary | ICD-10-CM | POA: Diagnosis not present

## 2016-08-27 DIAGNOSIS — Z952 Presence of prosthetic heart valve: Secondary | ICD-10-CM | POA: Diagnosis not present

## 2016-08-27 DIAGNOSIS — R918 Other nonspecific abnormal finding of lung field: Secondary | ICD-10-CM | POA: Diagnosis not present

## 2016-08-27 DIAGNOSIS — I517 Cardiomegaly: Secondary | ICD-10-CM | POA: Diagnosis not present

## 2016-08-28 DIAGNOSIS — I058 Other rheumatic mitral valve diseases: Secondary | ICD-10-CM | POA: Diagnosis not present

## 2016-08-28 DIAGNOSIS — G8918 Other acute postprocedural pain: Secondary | ICD-10-CM | POA: Diagnosis not present

## 2016-08-28 DIAGNOSIS — Z952 Presence of prosthetic heart valve: Secondary | ICD-10-CM | POA: Diagnosis not present

## 2016-08-28 DIAGNOSIS — Z452 Encounter for adjustment and management of vascular access device: Secondary | ICD-10-CM | POA: Diagnosis not present

## 2016-08-28 IMAGING — CR DG CHEST 2V
2 series · 2 of 2 positions shown · non-contrast
Comparison: 09/22/2013

CLINICAL DATA: Sickle cell anemia crisis and back pain.

EXAM:
CHEST - 2 VIEW

[w chest pa]
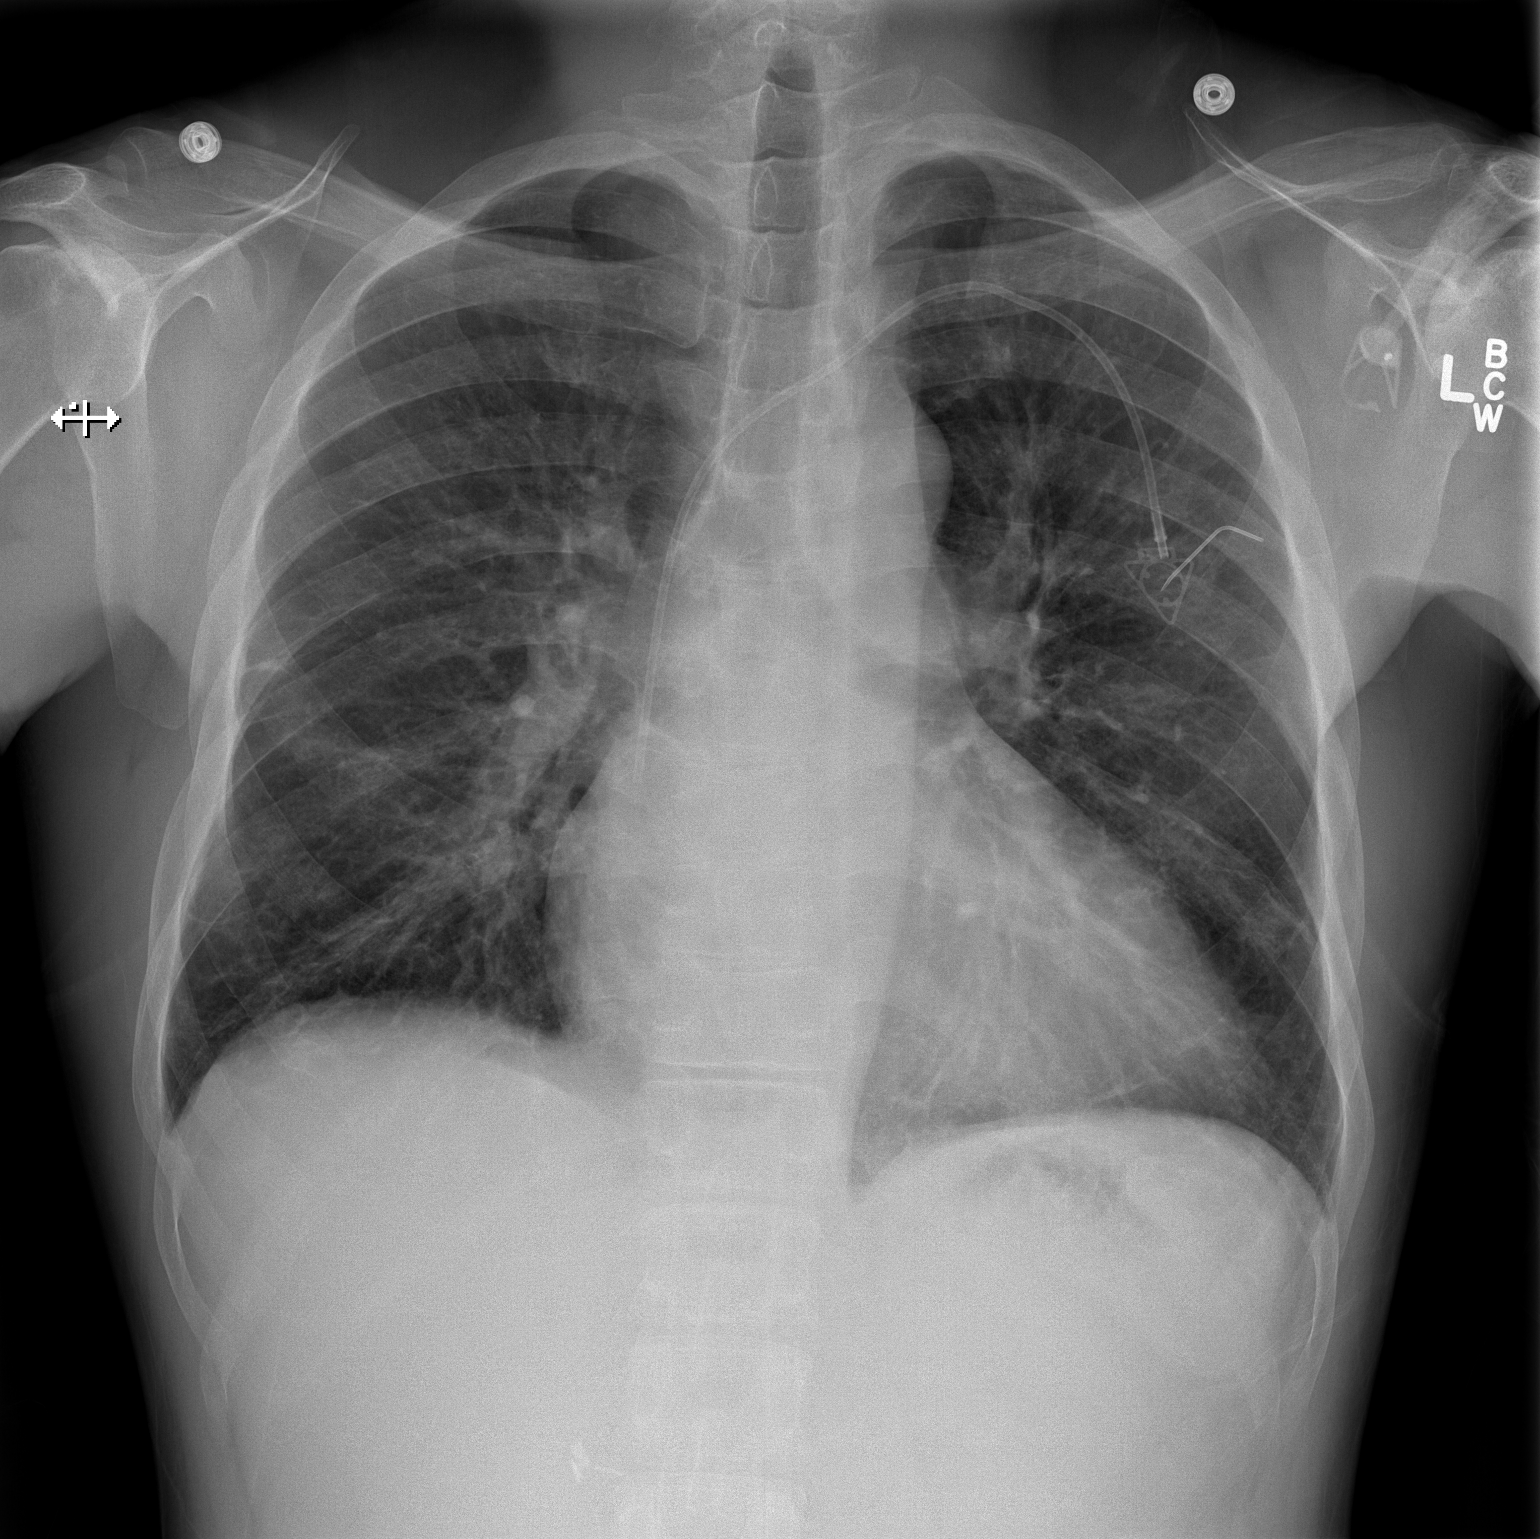

[w chest lat]
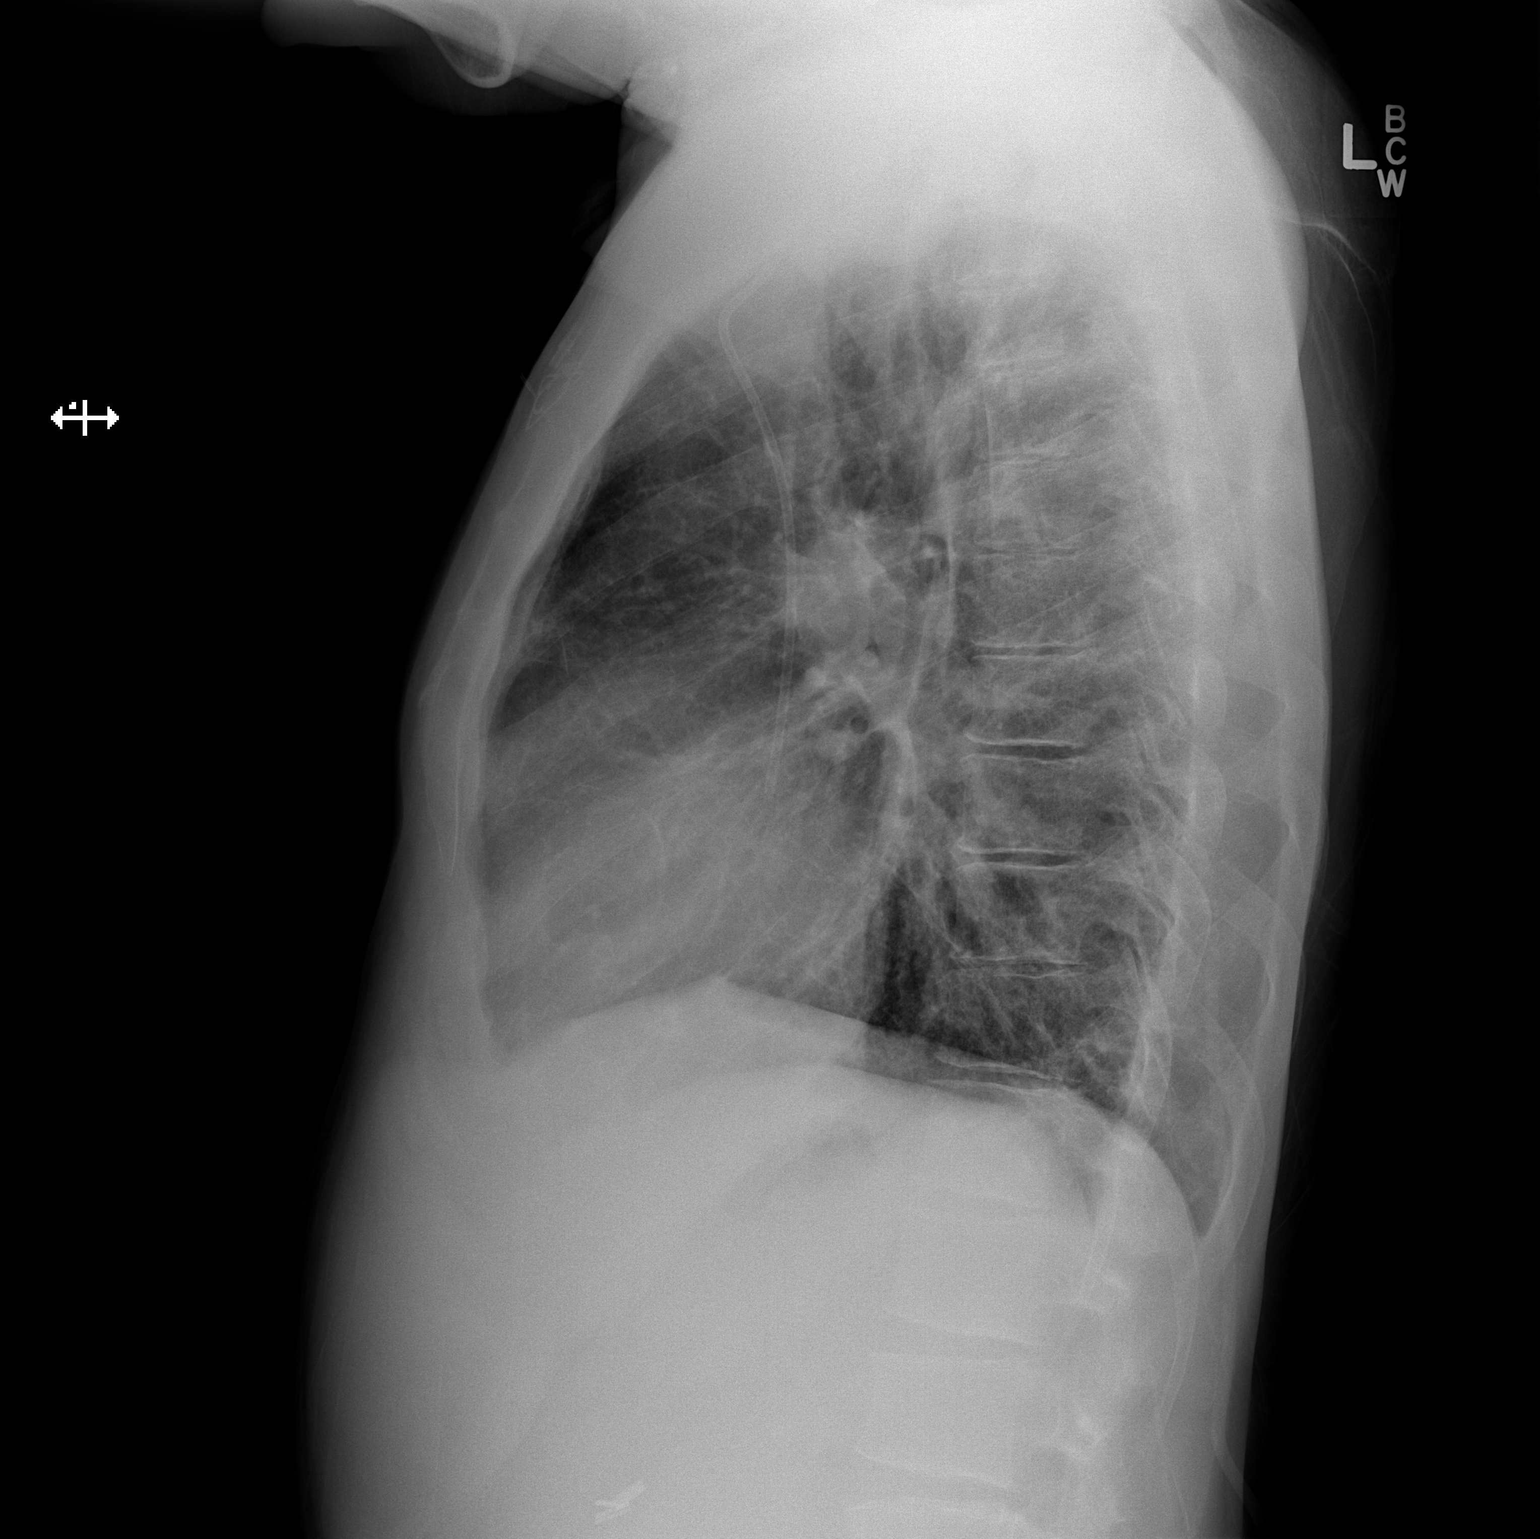

[2 of 2 positions shown; findings below may reference images not displayed]

FINDINGS: Port-A-Cath positioning is stable. Stable appearance of scattered
areas of pulmonary scarring bilaterally. There is no evidence of
pulmonary edema, consolidation, pneumothorax, nodule or pleural
fluid. The heart size is stable and at the upper limits of normal.
The visualized spine shows no fractures or significant degenerative
disease.
IMPRESSION: No active disease.  Stable pulmonary scarring.

## 2016-08-29 DIAGNOSIS — I639 Cerebral infarction, unspecified: Secondary | ICD-10-CM | POA: Diagnosis not present

## 2016-08-29 DIAGNOSIS — Z952 Presence of prosthetic heart valve: Secondary | ICD-10-CM | POA: Diagnosis not present

## 2016-08-29 DIAGNOSIS — I272 Pulmonary hypertension, unspecified: Secondary | ICD-10-CM | POA: Diagnosis not present

## 2016-08-29 DIAGNOSIS — D571 Sickle-cell disease without crisis: Secondary | ICD-10-CM | POA: Diagnosis not present

## 2016-08-29 DIAGNOSIS — R7881 Bacteremia: Secondary | ICD-10-CM | POA: Diagnosis not present

## 2016-08-29 DIAGNOSIS — I330 Acute and subacute infective endocarditis: Secondary | ICD-10-CM | POA: Diagnosis not present

## 2016-08-29 DIAGNOSIS — I058 Other rheumatic mitral valve diseases: Secondary | ICD-10-CM | POA: Diagnosis not present

## 2016-08-29 DIAGNOSIS — B957 Other staphylococcus as the cause of diseases classified elsewhere: Secondary | ICD-10-CM | POA: Diagnosis not present

## 2016-08-30 ENCOUNTER — Encounter: Payer: Self-pay | Admitting: Pharmacist

## 2016-08-30 DIAGNOSIS — Z952 Presence of prosthetic heart valve: Secondary | ICD-10-CM | POA: Diagnosis not present

## 2016-08-30 DIAGNOSIS — D571 Sickle-cell disease without crisis: Secondary | ICD-10-CM | POA: Diagnosis not present

## 2016-08-30 DIAGNOSIS — I058 Other rheumatic mitral valve diseases: Secondary | ICD-10-CM | POA: Diagnosis not present

## 2016-08-30 DIAGNOSIS — I272 Pulmonary hypertension, unspecified: Secondary | ICD-10-CM | POA: Diagnosis not present

## 2016-09-01 DIAGNOSIS — B957 Other staphylococcus as the cause of diseases classified elsewhere: Secondary | ICD-10-CM | POA: Diagnosis not present

## 2016-09-01 DIAGNOSIS — I330 Acute and subacute infective endocarditis: Secondary | ICD-10-CM | POA: Diagnosis not present

## 2016-09-01 DIAGNOSIS — D571 Sickle-cell disease without crisis: Secondary | ICD-10-CM | POA: Diagnosis not present

## 2016-09-01 DIAGNOSIS — I272 Pulmonary hypertension, unspecified: Secondary | ICD-10-CM | POA: Diagnosis not present

## 2016-09-01 DIAGNOSIS — N179 Acute kidney failure, unspecified: Secondary | ICD-10-CM | POA: Diagnosis not present

## 2016-09-01 DIAGNOSIS — Z952 Presence of prosthetic heart valve: Secondary | ICD-10-CM | POA: Diagnosis not present

## 2016-09-01 DIAGNOSIS — R7881 Bacteremia: Secondary | ICD-10-CM | POA: Diagnosis not present

## 2016-09-01 DIAGNOSIS — Z79899 Other long term (current) drug therapy: Secondary | ICD-10-CM | POA: Diagnosis not present

## 2016-09-01 DIAGNOSIS — I058 Other rheumatic mitral valve diseases: Secondary | ICD-10-CM | POA: Diagnosis not present

## 2016-09-01 NOTE — Progress Notes (Signed)
[name] MRN: [id number] DOB/AGE: Dec 31, 1980 36 y.o.  Admit date: 08/05/2016 Discharge date: not yet determined  Primary Care Physician:  Tresa Garter, MD   Discharge Diagnoses:   Patient Active Problem List   Diagnosis Date Noted  . Acute bacterial endocarditis   . Endocarditis of mitral valve 08/11/2016  . 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 Coagulase negative Staphylococcus bacteremia 08/05/2016  . PE (pulmonary thromboembolism) ([place]) 07/26/2016  . H/O arterial ischemic stroke 11/11/2015  . AKI (acute kidney injury) ([place])   . Prolonged Q-T interval on ECG 09/10/2014  . Cor pulmonale, chronic ([place]) 08/25/2014  . Sickle cell anemia ([place]) 06/25/2014  . Anemia of chronic disease 06/25/2014  . PAH (pulmonary artery hypertension) ([place]) 03/18/2014  . Chronic respiratory failure with hypoxia ([place]) 03/14/2014  . Paralytic strabismus, external ophthalmoplegia   . Chronic pain syndrome 12/12/2013  . Chronic anticoagulation 08/22/2013  . Essential hypertension 08/22/2013  . Pulmonary HTN ([place]) 06/18/2013  . Functional asplenia   . Vitamin D deficiency 02/13/2013  . 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 Avascular necrosis ([place])     DISCHARGE MEDICATION: Allergies as of 08/14/2016   No Known Allergies     Medication List    STOP taking these medications   lisinopril 2.5 MG tablet Commonly known as:  PRINIVIL,ZESTRIL     TAKE these medications   carvedilol 3.125 MG tablet Commonly known as:  COREG Take 1 tablet (3.125 mg total) by mouth 2 (two) times daily with a meal.   cholecalciferol 1000 units tablet Commonly known as:  VITAMIN D Take 2,000 Units by mouth daily.   folic acid 1 MG tablet Commonly known as:  FOLVITE Take 2 tablets (2 mg total) by mouth daily.   gabapentin 300 MG capsule Commonly known as:  NEURONTIN Take 1 capsule (300 mg total) by mouth 3 (three) times daily.   hydroxyurea 500 MG capsule Commonly known as:   HYDREA Take 1,000 mg by mouth every other day. May take with food to minimize GI side effects.   hydroxyurea 500 MG capsule Commonly known as:  HYDREA Take 500 mg by mouth every other day. May take with food to minimize GI side effects.   L-glutamine 5 g Pack Powder Packet Commonly known as:  ENDARI Take 15 g by mouth 2 (two) times daily.   morphine 30 MG 12 hr tablet Commonly known as:  MS CONTIN Take 1 tablet (30 mg total) by mouth every 12 (twelve) hours.   potassium chloride SA 20 MEQ tablet Commonly known as:  K-DUR,KLOR-CON Take 2 tablets (40 mEq total) by mouth daily.   Rivaroxaban 15 & 20 MG Tbpk Take as directed on package: Start with one 15mg  tablet by mouth twice a day with food. On Day 22, switch to one 20mg  tablet once a day with food. What changed:  how much to take  how to take this  when to take this  additional instructions   torsemide 20 MG tablet Commonly known as:  DEMADEX Take 1 tablet (20 mg total) by mouth daily as needed (leg swelling).   vancomycin 1-5 GM/200ML-% Soln Commonly known as:  VANCOCIN Inject 200 mLs (1,000 mg total) into the vein every 12 (twelve) hours.   zolpidem 10 MG tablet Commonly known as:  AMBIEN Take 1 tablet (10 mg total) by mouth at bedtime as needed for sleep.     ASK your doctor about these medications   HYDROmorphone 4 MG tablet Commonly known as:  DILAUDID  Take 1 tablet (4 mg total) by mouth every 4 (four) hours as needed for moderate pain or severe pain. Ask about: Should I take this medication?         Consults:    SIGNIFICANT DIAGNOSTIC STUDIES:  Dg Chest 2 View  Result Date: 08/05/2016 CLINICAL DATA:  Positive blood cultures.  Rib pain. EXAM: CHEST  2 VIEW COMPARISON:  Radiographs and CT 07/26/2016 FINDINGS: Left chest port tip in the mid SVC. Stable cardiomegaly. Mild central pulmonary edema. No pleural effusion. No focal airspace disease. No pneumothorax. Stable osseous structures. IMPRESSION: Stable  cardiomegaly with mild pulmonary edema. No focal airspace disease. Electronically Signed   By: Jeb Levering M.D.   On: 08/05/2016 23:46     ECHO:   Study Conclusions 08/07/2016 - Left ventricle: The cavity size was normal. Wall thickness was   increased in a pattern of mild LVH. Systolic function was normal.   The estimated ejection fraction was in the range of 60% to 65%. - Ventricular septum: The contour showed diastolic flattening and   systolic flattening. These changes are consistent with RV volume   and pressure overload. - Left atrium: The atrium was moderately dilated. - Right ventricle: The cavity size was severely dilated. Systolic   function was reduced. - Right atrium: The atrium was dilated. - Tricuspid valve: There was severe regurgitation directed   eccentrically. - Pulmonary arteries: PA peak pressure: 55 mm Hg (S). - Systemic veins: IVC is dilated with normal respiratory variation.   Estimated RAP 8 mmHg. - Pericardium, extracardiac: A trivial pericardial effusion was   identified.  Impressions:  - No obvious vegetations noted.  TEE  - Left ventricle: Systolic function was normal. The estimated   ejection fraction was in the range of 55% to 60%. Wall motion was   normal; there were no regional wall motion abnormalities. - Aortic valve: No evidence of vegetation. There was trivial   regurgitation. - Mitral valve: There was an apparent, large vegetation on the   anterior leaflet. There was mild regurgitation. - Left atrium: The atrium was mildly dilated. - Right ventricle: The cavity size was moderately dilated. Systolic   function was severely reduced. - Right atrium: The atrium was moderately dilated. - Atrial septum: There was a possible patent foramen ovale. - Tricuspid valve: There was severe regurgitation. - Pulmonic valve: No evidence of vegetation. - Pericardium, extracardiac: A small pericardial effusion was    identified.  Impressions:  - Normal LV systolic function; mild LAE; moderate RVE with severely   reduced function; moderate RAE; small pericardial effusion; large   mass (2.6 x 1.6 cm) on anterior MV leaflet concerning for   vegetation; mild MR; malcoaptation of TV leaflets with possible   perforated septal leaflet; severe TR; possible PFO.   No results found for this or any previous visit (from the past 240 hour(s)).  BRIEF ADMITTING H & P: [name] is a 36 y.o. male with history of sickle cell anemia, pulmonary hypertension, pulmonary embolism was advised to come to the ER after patient blood cultures done and 08/05/2078 was showing MRSA bacteremia. Prior to this patient had blood cultures done at [facility] which showed staph bacteremia. Patient has such denies any fever or chills productive cough dysuria discharges or diarrhea. Patient does have pain characteristic of his sickle cell anemia. Pain is mostly in the ribs.   ED Course: In the ER labs reveal acute renal failure. Chest x-ray shows mild congestion. Patient  was started on vancomycin and admitted for further management of MRSA bacteremia and also on IV fluids for acute renal failure. Urine analysis is pending.    Hospital Course:  Present on Admission: . Chronic respiratory failure with hypoxia ([place]) . PAH (pulmonary artery hypertension) ([place]) . PE (pulmonary thromboembolism) ([place]) . Sickle cell anemia ([facility]) . AKI (acute kidney injury) ([place]) . Coagulase negative Staphylococcus bacteremia . 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 On admission blood cultures were done both from the port and from the periphery. All of which resulted with positive staph epidermidis. The patient was started on IV vancomycin on 08/06/2016. He is  continue IV vancomycin. Infectious diseases consult and agreed with the therapy. It was felt that the source of the infection is his port and infectious disease advised that the port be removed. However in light of the patient's other complications which includes endocarditis I have discussed this with the physicians at [facility] and the portal remain in until the patient is transferred to [facility] at that time they will proceed with port removal and alternative IV access. The patient would need a total of 6 weeks of IV vancomycin therapy.   Given the findings of the echocardiogram on his previous admission which were inconsistent with the findings and the current echocardiogram transesophageal echocardiogram is ordered. Patient underwent TEE and was found to have a large vegetation on the mitral valve (2.61.6 cm) with mild mitral regurgitation in addition to severe tricuspid regurgitation with mild coaptation of the tricuspid valve lead left with possible perforated septal leaflet. There was also possible PFO. In light of these findings this was discussed with cardiology. It was felt that given the patient's complex medical condition and the fact that his hematologist and pulmonologist are both at [facility] it was best to transfer the patient to [facility] for further surgical intervention. I spoke with Dr. Marney Setting cardiovascular surgery [facility] in the patient's been accepted for transfer. Currently we are awaiting a bed at [facility] to transfer the patient with the expectation that surgical intervention will occur around the first of next week.  Patient also has recurrent pulmonary emboli is currently on Xarelto for chronic anticoagulation therapy.  Patient has chronic respiratory failure with hypoxemia and has a baseline oxygen requirement is 3 L/m. Currently he is at baseline for his oxygen use. This is felt to be secondary to the pulmonary  arterial hypertension as well as recurrent pulmonary emboli.  Patient has a known anemia of chronic disease and given all his comorbidities that goes to keep his hemoglobin levels at around 8. During his hospitalization the patient was transfused one unit of rbc's and at the time of discharge his hemoglobin is 7.7 g/dL. At this time the patient is asymptomatic and further transfusions will be deferred until he is transferred to [facility] on [address] is a acute change in his condition.  During this hospitalization the patient had an increase in his creatinine which is felt to be multifactorial owing to the use of Toradol as well as vancomycin. The patient was given IV hydration and lisinopril was held. With these interventions he had a resolution back to baseline creatinine.   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 He complained of pain during this hospitalization. A  hemoglobin electrophoresis shows the patient's percentage of hemoglobin S was 30%. Thus the contribution of pain from his sickle cell is negligible.  Patient has chronic pain syndrome and during his hospitalization he was continued on his current medications which include MS Contin tablet gabapentin and oral Dilaudid on a when necessary basis.  Patient has a history of multiple CVAs and was continued on his aspirin as prescribed.     Disposition and Follow-up: Patient is being transferred to [facility] and has been accepted by Dr. Marney Setting.   DISCHARGE EXAM: This is a preliminary Summary of hospital course in anticipation of patient being transferred to [facility] this Weekend. Dr, Barbette Merino is will be the rounding attending Physician and an examination in addition to the remainder of patients clinical course will be documented by Dr. Jonelle Sidle at time of transfer this weekend.  No results for input(s): NA, K, CL, CO2, GLUCOSE, BUN, CREATININE, CALCIUM, MG, PHOS in the last 72  hours. No results for input(s): AST, ALT, ALKPHOS, BILITOT, PROT, ALBUMIN in the last 72 hours. No results for input(s): LIPASE, AMYLASE in the last 72 hours. No results for input(s): WBC, NEUTROABS, HGB, HCT, MCV, PLT in the last 72 hours.     SignedBarbette Merino 08/14/2016, 12:49 AM

## 2016-09-02 DIAGNOSIS — I272 Pulmonary hypertension, unspecified: Secondary | ICD-10-CM | POA: Diagnosis not present

## 2016-09-02 DIAGNOSIS — M79671 Pain in right foot: Secondary | ICD-10-CM | POA: Diagnosis not present

## 2016-09-02 DIAGNOSIS — D571 Sickle-cell disease without crisis: Secondary | ICD-10-CM | POA: Diagnosis not present

## 2016-09-02 DIAGNOSIS — Z952 Presence of prosthetic heart valve: Secondary | ICD-10-CM | POA: Diagnosis not present

## 2016-09-02 DIAGNOSIS — I058 Other rheumatic mitral valve diseases: Secondary | ICD-10-CM | POA: Diagnosis not present

## 2016-09-03 DIAGNOSIS — J810 Acute pulmonary edema: Secondary | ICD-10-CM | POA: Diagnosis not present

## 2016-09-03 DIAGNOSIS — J811 Chronic pulmonary edema: Secondary | ICD-10-CM | POA: Diagnosis not present

## 2016-09-05 DIAGNOSIS — J811 Chronic pulmonary edema: Secondary | ICD-10-CM | POA: Diagnosis not present

## 2016-09-05 DIAGNOSIS — I058 Other rheumatic mitral valve diseases: Secondary | ICD-10-CM | POA: Diagnosis not present

## 2016-09-05 DIAGNOSIS — I330 Acute and subacute infective endocarditis: Secondary | ICD-10-CM | POA: Diagnosis not present

## 2016-09-05 DIAGNOSIS — Z952 Presence of prosthetic heart valve: Secondary | ICD-10-CM | POA: Diagnosis not present

## 2016-09-05 DIAGNOSIS — I517 Cardiomegaly: Secondary | ICD-10-CM | POA: Diagnosis not present

## 2016-09-05 DIAGNOSIS — B957 Other staphylococcus as the cause of diseases classified elsewhere: Secondary | ICD-10-CM | POA: Diagnosis not present

## 2016-09-05 DIAGNOSIS — Z452 Encounter for adjustment and management of vascular access device: Secondary | ICD-10-CM | POA: Diagnosis not present

## 2016-09-05 DIAGNOSIS — R7881 Bacteremia: Secondary | ICD-10-CM | POA: Diagnosis not present

## 2016-09-05 DIAGNOSIS — I272 Pulmonary hypertension, unspecified: Secondary | ICD-10-CM | POA: Diagnosis not present

## 2016-09-05 DIAGNOSIS — R918 Other nonspecific abnormal finding of lung field: Secondary | ICD-10-CM | POA: Diagnosis not present

## 2016-09-05 DIAGNOSIS — J90 Pleural effusion, not elsewhere classified: Secondary | ICD-10-CM | POA: Diagnosis not present

## 2016-09-05 DIAGNOSIS — I639 Cerebral infarction, unspecified: Secondary | ICD-10-CM | POA: Diagnosis not present

## 2016-09-05 DIAGNOSIS — Z79899 Other long term (current) drug therapy: Secondary | ICD-10-CM | POA: Diagnosis not present

## 2016-09-05 DIAGNOSIS — N179 Acute kidney failure, unspecified: Secondary | ICD-10-CM | POA: Diagnosis not present

## 2016-09-05 DIAGNOSIS — R262 Difficulty in walking, not elsewhere classified: Secondary | ICD-10-CM | POA: Diagnosis not present

## 2016-09-05 DIAGNOSIS — D571 Sickle-cell disease without crisis: Secondary | ICD-10-CM | POA: Diagnosis not present

## 2016-09-06 ENCOUNTER — Encounter: Payer: Self-pay | Admitting: Pharmacist

## 2016-09-07 ENCOUNTER — Telehealth: Payer: Self-pay

## 2016-09-07 DIAGNOSIS — D571 Sickle-cell disease without crisis: Secondary | ICD-10-CM | POA: Diagnosis not present

## 2016-09-07 DIAGNOSIS — F5101 Primary insomnia: Secondary | ICD-10-CM

## 2016-09-07 MED ORDER — HYDROMORPHONE HCL 4 MG PO TABS
4.0000 mg | ORAL_TABLET | ORAL | 0 refills | Status: AC | PRN
Start: 2016-09-07 — End: 2016-09-22

## 2016-09-07 MED ORDER — ZOLPIDEM TARTRATE 10 MG PO TABS: 10.0000 mg | ORAL_TABLET | Freq: Every evening | ORAL | 0 refills | Status: AC | PRN

## 2016-09-07 MED ORDER — MORPHINE SULFATE ER 30 MG PO TBCR: 30.0000 mg | EXTENDED_RELEASE_TABLET | Freq: Two times a day (BID) | ORAL | 0 refills | Status: AC

## 2016-09-07 NOTE — Telephone Encounter (Signed)
Filled prescriptions for Hydromorphone. MsContin, and Ambien.

## 2016-09-08 ENCOUNTER — Encounter: Payer: Self-pay | Admitting: Pharmacist

## 2016-09-09 ENCOUNTER — Emergency Department (HOSPITAL_COMMUNITY): Payer: Medicare Other

## 2016-09-09 ENCOUNTER — Inpatient Hospital Stay (HOSPITAL_COMMUNITY): Payer: Medicare Other

## 2016-09-09 ENCOUNTER — Inpatient Hospital Stay (HOSPITAL_COMMUNITY)
Admission: EM | Admit: 2016-09-09 | Discharge: 2016-10-01 | DRG: 871 | Disposition: E | Payer: Medicare Other | Attending: Internal Medicine | Admitting: Internal Medicine

## 2016-09-09 ENCOUNTER — Encounter (HOSPITAL_COMMUNITY): Payer: Self-pay

## 2016-09-09 DIAGNOSIS — Z96641 Presence of right artificial hip joint: Secondary | ICD-10-CM | POA: Diagnosis present

## 2016-09-09 DIAGNOSIS — Z952 Presence of prosthetic heart valve: Secondary | ICD-10-CM | POA: Diagnosis not present

## 2016-09-09 DIAGNOSIS — I471 Supraventricular tachycardia: Secondary | ICD-10-CM | POA: Diagnosis not present

## 2016-09-09 DIAGNOSIS — G894 Chronic pain syndrome: Secondary | ICD-10-CM | POA: Diagnosis not present

## 2016-09-09 DIAGNOSIS — I5021 Acute systolic (congestive) heart failure: Secondary | ICD-10-CM | POA: Diagnosis not present

## 2016-09-09 DIAGNOSIS — A419 Sepsis, unspecified organism: Secondary | ICD-10-CM | POA: Diagnosis not present

## 2016-09-09 DIAGNOSIS — I5041 Acute combined systolic (congestive) and diastolic (congestive) heart failure: Secondary | ICD-10-CM | POA: Diagnosis present

## 2016-09-09 DIAGNOSIS — E876 Hypokalemia: Secondary | ICD-10-CM | POA: Diagnosis not present

## 2016-09-09 DIAGNOSIS — Z79891 Long term (current) use of opiate analgesic: Secondary | ICD-10-CM | POA: Diagnosis not present

## 2016-09-09 DIAGNOSIS — D649 Anemia, unspecified: Secondary | ICD-10-CM | POA: Diagnosis not present

## 2016-09-09 DIAGNOSIS — R0682 Tachypnea, not elsewhere classified: Secondary | ICD-10-CM | POA: Diagnosis not present

## 2016-09-09 DIAGNOSIS — I058 Other rheumatic mitral valve diseases: Secondary | ICD-10-CM | POA: Diagnosis not present

## 2016-09-09 DIAGNOSIS — I110 Hypertensive heart disease with heart failure: Secondary | ICD-10-CM | POA: Diagnosis present

## 2016-09-09 DIAGNOSIS — J9621 Acute and chronic respiratory failure with hypoxia: Secondary | ICD-10-CM | POA: Diagnosis not present

## 2016-09-09 DIAGNOSIS — Z452 Encounter for adjustment and management of vascular access device: Secondary | ICD-10-CM | POA: Diagnosis not present

## 2016-09-09 DIAGNOSIS — A4102 Sepsis due to Methicillin resistant Staphylococcus aureus: Principal | ICD-10-CM | POA: Diagnosis present

## 2016-09-09 DIAGNOSIS — R0902 Hypoxemia: Secondary | ICD-10-CM | POA: Diagnosis not present

## 2016-09-09 DIAGNOSIS — Z8673 Personal history of transient ischemic attack (TIA), and cerebral infarction without residual deficits: Secondary | ICD-10-CM | POA: Diagnosis not present

## 2016-09-09 DIAGNOSIS — I272 Pulmonary hypertension, unspecified: Secondary | ICD-10-CM | POA: Diagnosis present

## 2016-09-09 DIAGNOSIS — Z86711 Personal history of pulmonary embolism: Secondary | ICD-10-CM | POA: Diagnosis not present

## 2016-09-09 DIAGNOSIS — I38 Endocarditis, valve unspecified: Secondary | ICD-10-CM | POA: Diagnosis not present

## 2016-09-09 DIAGNOSIS — I509 Heart failure, unspecified: Secondary | ICD-10-CM | POA: Diagnosis not present

## 2016-09-09 DIAGNOSIS — I371 Nonrheumatic pulmonary valve insufficiency: Secondary | ICD-10-CM

## 2016-09-09 DIAGNOSIS — N171 Acute kidney failure with acute cortical necrosis: Secondary | ICD-10-CM | POA: Diagnosis not present

## 2016-09-09 DIAGNOSIS — Z833 Family history of diabetes mellitus: Secondary | ICD-10-CM

## 2016-09-09 DIAGNOSIS — J9620 Acute and chronic respiratory failure, unspecified whether with hypoxia or hypercapnia: Secondary | ICD-10-CM | POA: Diagnosis not present

## 2016-09-09 DIAGNOSIS — I5033 Acute on chronic diastolic (congestive) heart failure: Secondary | ICD-10-CM | POA: Diagnosis not present

## 2016-09-09 DIAGNOSIS — J811 Chronic pulmonary edema: Secondary | ICD-10-CM

## 2016-09-09 DIAGNOSIS — E872 Acidosis, unspecified: Secondary | ICD-10-CM

## 2016-09-09 DIAGNOSIS — N179 Acute kidney failure, unspecified: Secondary | ICD-10-CM | POA: Diagnosis not present

## 2016-09-09 DIAGNOSIS — R718 Other abnormality of red blood cells: Secondary | ICD-10-CM | POA: Diagnosis not present

## 2016-09-09 DIAGNOSIS — Z9049 Acquired absence of other specified parts of digestive tract: Secondary | ICD-10-CM

## 2016-09-09 DIAGNOSIS — D6832 Hemorrhagic disorder due to extrinsic circulating anticoagulants: Secondary | ICD-10-CM | POA: Diagnosis present

## 2016-09-09 DIAGNOSIS — Z818 Family history of other mental and behavioral disorders: Secondary | ICD-10-CM | POA: Diagnosis not present

## 2016-09-09 DIAGNOSIS — Z87891 Personal history of nicotine dependence: Secondary | ICD-10-CM

## 2016-09-09 DIAGNOSIS — D571 Sickle-cell disease without crisis: Secondary | ICD-10-CM | POA: Diagnosis present

## 2016-09-09 DIAGNOSIS — J9600 Acute respiratory failure, unspecified whether with hypoxia or hypercapnia: Secondary | ICD-10-CM | POA: Diagnosis not present

## 2016-09-09 DIAGNOSIS — R7881 Bacteremia: Secondary | ICD-10-CM | POA: Diagnosis not present

## 2016-09-09 DIAGNOSIS — G8929 Other chronic pain: Secondary | ICD-10-CM | POA: Diagnosis present

## 2016-09-09 DIAGNOSIS — Z832 Family history of diseases of the blood and blood-forming organs and certain disorders involving the immune mechanism: Secondary | ICD-10-CM

## 2016-09-09 DIAGNOSIS — I330 Acute and subacute infective endocarditis: Secondary | ICD-10-CM | POA: Diagnosis not present

## 2016-09-09 DIAGNOSIS — T45515A Adverse effect of anticoagulants, initial encounter: Secondary | ICD-10-CM | POA: Diagnosis present

## 2016-09-09 DIAGNOSIS — R9431 Abnormal electrocardiogram [ECG] [EKG]: Secondary | ICD-10-CM | POA: Diagnosis not present

## 2016-09-09 DIAGNOSIS — E875 Hyperkalemia: Secondary | ICD-10-CM | POA: Diagnosis present

## 2016-09-09 DIAGNOSIS — N184 Chronic kidney disease, stage 4 (severe): Secondary | ICD-10-CM | POA: Diagnosis not present

## 2016-09-09 DIAGNOSIS — I2699 Other pulmonary embolism without acute cor pulmonale: Secondary | ICD-10-CM | POA: Diagnosis not present

## 2016-09-09 DIAGNOSIS — I428 Other cardiomyopathies: Secondary | ICD-10-CM | POA: Diagnosis present

## 2016-09-09 DIAGNOSIS — I5043 Acute on chronic combined systolic (congestive) and diastolic (congestive) heart failure: Secondary | ICD-10-CM | POA: Diagnosis not present

## 2016-09-09 DIAGNOSIS — I959 Hypotension, unspecified: Secondary | ICD-10-CM | POA: Diagnosis not present

## 2016-09-09 DIAGNOSIS — I2609 Other pulmonary embolism with acute cor pulmonale: Secondary | ICD-10-CM | POA: Diagnosis not present

## 2016-09-09 HISTORY — DX: Endocarditis, valve unspecified: I38

## 2016-09-09 LAB — CBC WITH DIFFERENTIAL/PLATELET
BASOS PCT: 0 %
Basophils Absolute: 0 10*3/uL (ref 0.0–0.1)
EOS PCT: 0 %
Eosinophils Absolute: 0 10*3/uL (ref 0.0–0.7)
HEMATOCRIT: 23 % — AB (ref 39.0–52.0)
Hemoglobin: 7.6 g/dL — ABNORMAL LOW (ref 13.0–17.0)
LYMPHS ABS: 1.9 10*3/uL (ref 0.7–4.0)
Lymphocytes Relative: 8 %
MCH: 31.8 pg (ref 26.0–34.0)
MCHC: 33 g/dL (ref 30.0–36.0)
MCV: 96.2 fL (ref 78.0–100.0)
MONOS PCT: 11 %
Monocytes Absolute: 2.6 10*3/uL — ABNORMAL HIGH (ref 0.1–1.0)
NEUTROS ABS: 19.2 10*3/uL — AB (ref 1.7–7.7)
Neutrophils Relative %: 81 %
Platelets: 423 10*3/uL — ABNORMAL HIGH (ref 150–400)
RBC: 2.39 MIL/uL — ABNORMAL LOW (ref 4.22–5.81)
RDW: 19.9 % — AB (ref 11.5–15.5)
WBC: 23.7 10*3/uL — ABNORMAL HIGH (ref 4.0–10.5)

## 2016-09-09 LAB — COMPREHENSIVE METABOLIC PANEL
ALBUMIN: 3.4 g/dL — AB (ref 3.5–5.0)
ALK PHOS: 134 U/L — AB (ref 38–126)
ALT: 29 U/L (ref 17–63)
AST: 69 U/L — ABNORMAL HIGH (ref 15–41)
Anion gap: 23 — ABNORMAL HIGH (ref 5–15)
BILIRUBIN TOTAL: 4.1 mg/dL — AB (ref 0.3–1.2)
BUN: 31 mg/dL — ABNORMAL HIGH (ref 6–20)
CALCIUM: 8.9 mg/dL (ref 8.9–10.3)
CO2: 12 mmol/L — AB (ref 22–32)
CREATININE: 3.91 mg/dL — AB (ref 0.61–1.24)
Chloride: 99 mmol/L — ABNORMAL LOW (ref 101–111)
GFR calc Af Amer: 21 mL/min — ABNORMAL LOW (ref >60)
GFR calc non Af Amer: 18 mL/min — ABNORMAL LOW (ref >60)
GLUCOSE: 87 mg/dL (ref 65–99)
Potassium: 6.1 mmol/L — ABNORMAL HIGH (ref 3.5–5.1)
SODIUM: 134 mmol/L — AB (ref 135–145)
Total Protein: 8.1 g/dL (ref 6.5–8.1)

## 2016-09-09 LAB — LACTIC ACID, PLASMA: LACTIC ACID, VENOUS: 5.3 mmol/L — AB (ref 0.5–1.9)

## 2016-09-09 LAB — I-STAT ARTERIAL BLOOD GAS, ED
ACID-BASE DEFICIT: 14 mmol/L — AB (ref 0.0–2.0)
BICARBONATE: 11.8 mmol/L — AB (ref 20.0–28.0)
O2 SAT: 98 %
PO2 ART: 125 mmHg — AB (ref 83.0–108.0)
Patient temperature: 102.5
TCO2: 13 mmol/L (ref 0–100)
pCO2 arterial: 30.2 mmHg — ABNORMAL LOW (ref 32.0–48.0)
pH, Arterial: 7.211 — ABNORMAL LOW (ref 7.350–7.450)

## 2016-09-09 LAB — ECHOCARDIOGRAM COMPLETE
AVLVOTPG: 5 mmHg
CHL CUP MV DEC (S): 278
E/e' ratio: 23.18
EWDT: 278 ms
FS: 20 % — AB (ref 28–44)
HEIGHTINCHES: 67 in
IV/PV OW: 0.87
LA vol index: 34.6 mL/m2
LADIAMINDEX: 1.66 cm/m2
LASIZE: 32 mm
LAVOL: 66.8 mL
LAVOLA4C: 54 mL
LEFT ATRIUM END SYS DIAM: 32 mm
LV E/e' medial: 23.18
LV e' LATERAL: 6.6 cm/s
LVEEAVG: 23.18
LVOT SV: 73 mL
LVOT VTI: 17.6 cm
LVOT area: 4.15 cm2
LVOT peak vel: 112 cm/s
LVOTD: 23 mm
Lateral S' vel: 5.63 cm/s
MV Peak grad: 9 mmHg
MVPKAVEL: 152 m/s
MVPKEVEL: 153 m/s
PW: 15 mm — AB (ref 0.6–1.1)
RV TAPSE: 10.7 mm
RV sys press: 41 mmHg
Reg peak vel: 253 cm/s
TDI e' lateral: 6.6
TDI e' medial: 6.25
TRMAXVEL: 253 cm/s
WEIGHTICAEL: 2864 [oz_av]

## 2016-09-09 LAB — URINALYSIS, ROUTINE W REFLEX MICROSCOPIC
BACTERIA UA: NONE SEEN
BILIRUBIN URINE: NEGATIVE
GLUCOSE, UA: NEGATIVE mg/dL
Ketones, ur: NEGATIVE mg/dL
Nitrite: NEGATIVE
PH: 6 (ref 5.0–8.0)
Protein, ur: 30 mg/dL — AB
SPECIFIC GRAVITY, URINE: 1.01 (ref 1.005–1.030)

## 2016-09-09 LAB — I-STAT CG4 LACTIC ACID, ED
Lactic Acid, Venous: 12.82 mmol/L (ref 0.5–1.9)
Lactic Acid, Venous: 6.17 mmol/L (ref 0.5–1.9)

## 2016-09-09 LAB — RAPID URINE DRUG SCREEN, HOSP PERFORMED
Amphetamines: NOT DETECTED
Barbiturates: NOT DETECTED
Benzodiazepines: NOT DETECTED
Cocaine: NOT DETECTED
OPIATES: POSITIVE — AB
Tetrahydrocannabinol: POSITIVE — AB

## 2016-09-09 LAB — PROTIME-INR
INR: 7.73
Prothrombin Time: 67.8 seconds — ABNORMAL HIGH (ref 11.4–15.2)

## 2016-09-09 LAB — SALICYLATE LEVEL

## 2016-09-09 LAB — MAGNESIUM: MAGNESIUM: 2.5 mg/dL — AB (ref 1.7–2.4)

## 2016-09-09 LAB — BRAIN NATRIURETIC PEPTIDE

## 2016-09-09 MED ORDER — VANCOMYCIN HCL IN DEXTROSE 1-5 GM/200ML-% IV SOLN: 1000.0000 mg | Freq: Once | INTRAVENOUS | Status: DC

## 2016-09-09 MED ORDER — SODIUM BICARBONATE 8.4 % IV SOLN
50.0000 meq | Freq: Once | INTRAVENOUS | Status: AC
Start: 2016-09-09 — End: 2016-09-09
  Administered 2016-09-09: 50 meq via INTRAVENOUS
  Filled 2016-09-09: qty 50

## 2016-09-09 MED ORDER — HYDRALAZINE HCL 20 MG/ML IJ SOLN: 10.0000 mg | INTRAMUSCULAR | Status: DC | PRN

## 2016-09-09 MED ORDER — PIPERACILLIN-TAZOBACTAM 3.375 G IVPB
3.3750 g | Freq: Three times a day (TID) | INTRAVENOUS | Status: DC
  Administered 2016-09-10 (×3): 3.375 g via INTRAVENOUS
  Filled 2016-09-09 (×5): qty 50

## 2016-09-09 MED ORDER — SODIUM BICARBONATE 8.4 % IV SOLN
INTRAVENOUS | Status: DC
Start: 2016-09-09 — End: 2016-09-10
  Administered 2016-09-09: via INTRAVENOUS
  Filled 2016-09-09 (×2): qty 850

## 2016-09-09 MED ORDER — STERILE WATER FOR INJECTION IV SOLN
INTRAVENOUS | Status: DC
  Filled 2016-09-09 (×2): qty 850

## 2016-09-09 MED ORDER — ACETAMINOPHEN 650 MG RE SUPP: 650.0000 mg | Freq: Once | RECTAL | Status: DC

## 2016-09-09 MED ORDER — FUROSEMIDE 10 MG/ML IJ SOLN
40.0000 mg | Freq: Once | INTRAMUSCULAR | Status: AC
  Administered 2016-09-09: 40 mg via INTRAVENOUS
  Filled 2016-09-09: qty 4

## 2016-09-09 MED ORDER — NITROGLYCERIN IN D5W 200-5 MCG/ML-% IV SOLN
0.0000 ug/min | INTRAVENOUS | Status: DC
  Administered 2016-09-09: 5 ug/min via INTRAVENOUS
  Filled 2016-09-09: qty 250

## 2016-09-09 MED ORDER — ACETAMINOPHEN 650 MG RE SUPP
650.0000 mg | Freq: Once | RECTAL | Status: AC
  Administered 2016-09-09: 650 mg via RECTAL
  Filled 2016-09-09: qty 1

## 2016-09-09 MED ORDER — PIPERACILLIN-TAZOBACTAM 3.375 G IVPB 30 MIN
3.3750 g | Freq: Once | INTRAVENOUS | Status: AC
  Administered 2016-09-09: 3.375 g via INTRAVENOUS
  Filled 2016-09-09: qty 50

## 2016-09-09 MED ORDER — SODIUM CHLORIDE 0.9 % IV BOLUS (SEPSIS)
1000.0000 mL | Freq: Once | INTRAVENOUS | Status: AC
  Administered 2016-09-09: 1000 mL via INTRAVENOUS

## 2016-09-09 MED ORDER — VANCOMYCIN HCL 10 G IV SOLR
2000.0000 mg | Freq: Once | INTRAVENOUS | Status: AC
  Administered 2016-09-09: 2000 mg via INTRAVENOUS
  Filled 2016-09-09: qty 2000

## 2016-09-09 MED ORDER — SODIUM CHLORIDE 0.9 % IV SOLN: 250.0000 mL | INTRAVENOUS | Status: DC | PRN

## 2016-09-09 MED ORDER — VANCOMYCIN HCL IN DEXTROSE 1-5 GM/200ML-% IV SOLN
1000.0000 mg | INTRAVENOUS | Status: DC
  Filled 2016-09-09: qty 200

## 2016-09-09 NOTE — ED Notes (Signed)
Dr Ashok Cordia in room.

## 2016-09-09 NOTE — Progress Notes (Signed)
This note also relates to the following rows which could not be included: SpO2 - Cannot attach notes to unvalidated device data  Pt placed on NRB mask per CCM, Dr. Ammie Dalton request to talk to the pt.  Pt tolerating NRB mask well at this time.

## 2016-09-09 NOTE — ED Notes (Signed)
MD ordered to stop nitro and given pt 577ml bolus and antibiotics.

## 2016-09-09 NOTE — ED Triage Notes (Signed)
Per EMS, pt from home with complaint of shob that began suddenly in the last hour, pt has pitting edema in lower extremities.. Pt had open heart surgery 1 month ago at [facility] and went Home 2 days ago. Pt had infection in the heart chamber. Pt has picc line in place. GCS 9. Pt having difficulty staying awake. Arousable and will follow commands but very drowsy. VS 150/110, HR 102 sinus tach, RR 30, spo2 92% on CPAP, could not get co2. CBG 90.

## 2016-09-09 NOTE — Progress Notes (Signed)
Pharmacy Antibiotic Note  ITALO BANTON is a 36 y.o. male admitted on 09/27/2016 with sepsis. Hx of endocarditis, recently d/c'd from [place] 2 days ago s/p OHS ~33mo ago. Pharmacy has been consulted for vancomycin/zosyn dosing. Tmax/24h 102.5, WBC 23.7, LA 12.82. AKI - SCr 3.91 on admit (baseline ~0.8-0.9), CrCl~26.  Plan: Zosyn 3.375g IV (55min infusion) x1; then 3.375g IV q8h (4h infusion) Vancomycin 2g IV x1; then 1g IV q24h Monitor clinical progress, c/s, renal function F/u de-escalation plan/LOT, vancomycin trough as indicated   Height: 5\' 7"  (170.2 cm) Weight: 179 lb (81.2 kg) IBW/kg (Calculated) : 66.1  Temp (24hrs), Avg:102.5 F (39.2 C), Min:102.5 F (39.2 C), Max:102.5 F (39.2 C)   Recent Labs Lab 09/08/2016 1728  LATICACIDVEN 12.82*    CrCl cannot be calculated (Patient's most recent lab result is older than the maximum 21 days allowed.).    No Known Allergies  Antimicrobials this admission: 8/10 vancomycin >>  8/10 zosyn >>   Dose adjustments this admission:   Microbiology results:   Elicia Lamp, PharmD, BCPS Clinical Pharmacist 09/29/2016 5:36 PM

## 2016-09-09 NOTE — ED Notes (Signed)
Pt tolerating bipap well at this moment. Mask readjusted.

## 2016-09-09 NOTE — ED Notes (Signed)
Condom cath placed on pt 

## 2016-09-09 NOTE — ED Notes (Signed)
Only able to collect 1 set of blood cultures prior to antibiotic admin due to difficulty stick.

## 2016-09-09 NOTE — ED Notes (Signed)
Phlebotomy in room to draw blood  

## 2016-09-09 NOTE — Consult Note (Signed)
PULMONARY / CRITICAL CARE MEDICINE   Name: [name] MRN: [id number] DOB: 27-Oct-1980    ADMISSION DATE:  09/05/2016 CONSULTATION DATE:  09/10/2016  REFERRING MD:  Arnetha Massy, M.D. / ED Resident  CHIEF COMPLAINT:  Lactic Acidosis  HISTORY OF PRESENT ILLNESS:  History obtained from the electronic medical record as well as loaded information from patient at bedside. 36 y.o. male with known history of sickle cell anemia and pulmonary hypertension. Patient has a known history of pulmonary embolism. Was found to have a MRSA bacteremia in July with blood cultures done at [facility]. Patient was evaluated by ID that recommended removal of port. The patient remained in place until transfer to do. He was ultimately found to have a large vegetation measuring at 2.6 cm in maximal dimension on transesophageal echocardiogram on his mitral valve with a possible PFO. Patient is followed by post hematology and pulmonology at [facility] and therefore transferred to [facility] was felt to be most appropriate. Patient underwent mechanical mitral valve replacement at [facility] as well as tricuspid valve ring on 7/17 as per his electronic medical record. In the emergency department the patient was started on noninvasive positive pressure ventilation for tenuous respiratory status. Lactic acidosis was noted and a bicarbonate drip was initiated. Empiric Vancomycin and Zosyn was administered. Lasix was given initially IV as well as a Nitroglycerin infusion.The patient's complex medical history PCCM was consulted. The patient reports that he has had increasing lower extremity edema for approximately 1 week. Denies any chest pain, pressure, or tightness. He has experienced increased shortness of breath particularly over the last 1-2 days with associated fever in that period of time. Denies any purulent cough. Reports only minimal back discomfort and denies any joint  pain, swelling, or erythema. He reports he remotely did have acute chest syndrome but has never been endotracheally intubated. Reports he has been compliant with his antibiotic and anticoagulant regimen. He reports he has had increased thirst lately but denies any increased sodium consumption. No recent sick contacts or travel.  PAST MEDICAL HISTORY :  Past Medical History:  Diagnosis Date  . Acute chest syndrome ([place]) 06/18/2013  . Acute embolism and thrombosis of right internal jugular vein ([place])   . Alcohol consumption of one to four drinks per day   . Avascular necrosis (HCC)    Right Hip  . Blood transfusion   . Chronic anticoagulation   . Demand ischemia ([place]) 01/02/2014  . Former smoker   . Functional asplenia   . Hb-SS disease with crisis ([place])   . History of Clostridium difficile infection   . History of pulmonary embolus (PE)   . Hypertension   . Hypokalemia   . Leukocytosis    Chronic  . Mood disorder ([place])   . Noncompliance with medication regimen   . Oxygen deficiency   . Pulmonary hypertension ([place])   . Second hand tobacco smoke exposure   . Sickle cell anemia (HCC)   . Sickle-cell crisis with associated acute chest syndrome ([place]) 05/13/2013  . Stroke ([name])   . Thrombocytosis (HCC)    Chronic  . Uses marijuana    PAST SURGICAL HISTORY: Past Surgical History:  Procedure Laterality Date  . CHOLECYSTECTOMY     01/2008  . Excision of left periauricular cyst     10/2009  . Excision of right ear lobe cyst with primary closur     11/2007  . Porta cath placement    .  Porta cath removal    . PORTACATH PLACEMENT  01/05/2012   Procedure: INSERTION PORT-A-CATH;  Surgeon: Odis Hollingshead, MD;  Location: [place];  Service: General;  Laterality: N/A;  ultrasound guiced port a cath insertion with fluoroscopy  . Right hip replacement     08/2006  . TEE WITHOUT CARDIOVERSION N/A 08/11/2016   Procedure: TRANSESOPHAGEAL ECHOCARDIOGRAM (TEE);  Surgeon: Lelon Perla, MD;   Location: [facility] ENDOSCOPY;  Service: Cardiovascular;  Laterality: N/A;  . UMBILICAL HERNIA REPAIR     01/2008    No Known Allergies  No current facility-administered medications on file prior to encounter.    Current Outpatient Prescriptions on File Prior to Encounter  Medication Sig  . carvedilol (COREG) 3.125 MG tablet Take 1 tablet (3.125 mg total) by mouth 2 (two) times daily with a meal.  . cholecalciferol (VITAMIN D) 1000 units tablet Take 2,000 Units by mouth daily.  . folic acid (FOLVITE) 1 MG tablet Take 2 tablets (2 mg total) by mouth daily.  Marland Kitchen gabapentin (NEURONTIN) 300 MG capsule Take 1 capsule (300 mg total) by mouth 3 (three) times daily.  Marland Kitchen HYDROmorphone (DILAUDID) 4 MG tablet Take 1 tablet (4 mg total) by mouth every 4 (four) hours as needed for severe pain.  . hydroxyurea (HYDREA) 500 MG capsule Take 500 mg by mouth every other day. May take with food to minimize GI side effects.  . hydroxyurea (HYDREA) 500 MG capsule Take 1,000 mg by mouth every other day. May take with food to minimize GI side effects.  . L-glutamine (ENDARI) 5 g PACK Powder Packet Take 15 g by mouth 2 (two) times daily.  Marland Kitchen morphine (MS CONTIN) 30 MG 12 hr tablet Take 1 tablet (30 mg total) by mouth every 12 (twelve) hours.  . potassium chloride SA (K-DUR,KLOR-CON) 20 MEQ tablet Take 2 tablets (40 mEq total) by mouth daily.  . Rivaroxaban 15 & 20 MG TBPK Take as directed on package: Start with one 15mg  tablet by mouth twice a day with food. On Day 22, switch to one 20mg  tablet once a day with food. (Patient taking differently: Take 15-20 mg by mouth 2 (two) times daily. Take as directed on package: Start with one 15mg  tablet by mouth twice a day with food. On Day 22, switch to one 20mg  tablet once a day with food.)  . torsemide (DEMADEX) 20 MG tablet Take 1 tablet (20 mg total) by mouth daily as needed (leg swelling).  . vancomycin (VANCOCIN) 1-5 GM/200ML-% SOLN Inject 200 mLs (1,000 mg total) into the vein  every 12 (twelve) hours.  Marland Kitchen zolpidem (AMBIEN) 10 MG tablet Take 1 tablet (10 mg total) by mouth at bedtime as needed for sleep.    FAMILY HISTORY:  Family History  Problem Relation Age of Onset  . Sickle cell trait Mother   . Depression Mother   . Diabetes Mother   . Sickle cell trait Father   . Sickle cell trait Brother     SOCIAL HISTORY: Social History   Social History  . Marital status: Single    Spouse name: N/A  . Number of children: 0  . Years of education: 53   Occupational History  . 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 Smoking status: Former Smoker    Packs/day: 0.50    Years: 10.00    Types: Cigarettes    Quit date: 05/29/2011  . Smokeless  tobacco: Never Used  . Alcohol use No  . Drug use: No     Comment: Marijuana weekly  . Sexual activity: Yes    Partners: Female    Birth control/ protection: Condom     Comment: month ago   Other Topics Concern  . Not on file   Social History Narrative   Lives in an apartment.  Single.  Lives alone but has a girlfriend that helps care for him.  Does not use any assist devices.        [name]:  [phone] Mom, emergency contact      [facility]:   Patient continuing to live in her apartment in town alone. 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   REVIEW OF SYSTEMS:  No rashes or abnormal bruising. No melena or hematochezia. No abdominal pain or diarrhea. No nausea or vomiting. A pertinent 14 point review of systems is negative except as per the history of presenting illness.  SUBJECTIVE: As above.  VITAL SIGNS: BP 137/82   Pulse (!) 124   Temp (!) 102.5 F (39.2 C) (Tympanic)   Resp (!) 28   Ht 5\' 7"  (1.702 m)   Wt 179 lb (81.2 kg)   SpO2 99%   BMI 28.04 kg/m   HEMODYNAMICS:    VENTILATOR SETTINGS: Vent Mode: BIPAP;PCV FiO2 (%):  [50 %] 50 % Set Rate:  [15 bmp] 15 bmp PEEP:  [3 cmH20] 3 cmH20  INTAKE / OUTPUT: No  intake/output data recorded.  PHYSICAL EXAMINATION: General:  Awake. Alert. No distress. No family at bedside.  Integument:  Warm & dry. No rash on exposed skin. Multiple tattoos noted. Extremities:  No cyanosis or clubbing.  Lymphatics:  No appreciated cervical or supraclavicular lymphadenoapthy. HEENT: No scleral injection or icterus. Pupils symmetric. Cardiovascular:  Regular rhythm. Pitting lower extremity edema to the knees. Unable to appreciate JVD.  Pulmonary:  Slightly diminished breath sounds in the bases. Otherwise clear with auscultation. Mild to moderately increased work of breathing on nonrebreather mask. Abdomen: Soft. Normal bowel sounds. Mildly protuberant. Grossly nontender. Musculoskeletal:  Normal bulk and tone. Hand grip strength 5/5 bilaterally. No joint deformity, erythema, or effusion appreciated. Neurological:  CN 2-12 grossly in tact. No meningismus. Moving all 4 extremities equally.  Psychiatric:  Mood and affect congruent. Oriented 4.  LABS:  BMET No results for input(s): NA, K, CL, CO2, BUN, CREATININE, GLUCOSE in the last 168 hours.  Electrolytes No results for input(s): CALCIUM, MG, PHOS in the last 168 hours.  CBC No results for input(s): WBC, HGB, HCT, PLT in the last 168 hours.  Coag's No results for input(s): APTT, INR in the last 168 hours.  Sepsis Markers  Recent Labs Lab 09/13/2016 1728  LATICACIDVEN 12.82*    ABG  Recent Labs Lab 09/19/2016 1730  PHART 7.211*  PCO2ART 30.2*  PO2ART 125.0*    Liver Enzymes No results for input(s): AST, ALT, ALKPHOS, BILITOT, ALBUMIN in the last 168 hours.  Cardiac Enzymes No results for input(s): TROPONINI, PROBNP in the last 168 hours.  Glucose No results for input(s): GLUCAP in the last 168 hours.  Imaging Dg Chest Portable 1 View  Result Date: 09/14/2016 CLINICAL DATA:  PICC line placement. Respiratory distress. Ex-smoker. EXAM: PORTABLE CHEST 1 VIEW COMPARISON:  08/05/2016. FINDINGS:  Left PICC tip at the junction of the superior vena cava and right atrium. Interval median sternotomy wires and prosthetic heart valve. Stable enlarged cardiac silhouette. Mild increase in prominence of the interstitial markings  with an interval small right pleural effusion. Decreased inspiration with mild right basilar patchy opacity in minimal left basilar linear density. Unremarkable bones. IMPRESSION: 1. Left PICC tip in satisfactory position. 2. Interval mild changes of congestive heart failure. 3. Poor inspiration with mild right basilar atelectasis and minimal left basilar atelectasis. 4. Stable cardiomegaly. Electronically Signed   By: Claudie Revering M.D.   On: 09/15/2016 17:43     STUDIES:  PORTABLE CXR 8/10:  Personally reviewed by me. Silhouetting bilateral hemidiaphragm suggestive of atelectasis. Questionable mild patchy alveolar opacities bilaterally. Suggestion of cardiomegaly. No obvious pleural effusion. Previous Port-A-Cath removed. Left upper extremities and venous catheter with tip in superior vena cava. No obvious consolidation or mass appreciated.   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 Unclear etiology Pulmonary  Hypertension H/O Pulmonary Emboli   P:   Recommend continuing nonrebreather mask No indication for endotracheal intubation at this time Continue to use noninvasive positive pressure ventilation intermittently for increased work of breathing Recommend continued monitoring with continuous pulse oximetry  CARDIOVASCULAR A:  Pulmonary Hypertension Tricuspid and Mitral Valve Endocarditis:  S/P mechanical mitral valve replacement & tricuspid valve ring 7/17 at [facility].  P:  Recommend checking BNP Recommend avoiding excessive further IV fluids Strongly consider cardiology consultation & echocardiogram if patient is not transferred  RENAL A:   Lactic Acidosis: Started on bicarbonate drip by emergency department provider.  P:   Awaiting results from serum electrolytes Recommend continued monitoring of urine output post Lasix Recommend repeating lactic acid in 2-4 hours to determine trend Can continue low-dose bicarbonate drip but would not increase rate at this time  GASTROINTESTINAL A:   No acute issues.  P:   Recommend nothing by mouth status for now  HEMATOLOGIC A:   Sickle Cell Anemia: Patient with baseline anemia. Coagulopathy: Likely secondary to systemic anticoagulation with Coumadin.  P:  Recommend holding invasive procedures for now Recommend holding Coumadin/systemic anticoagulation  INFECTIOUS A:   Possible Sepsis H/O MRSE Bacteremia & Endocarditis:  On Daptomycin to 8/27 from [facility].  P:   Agree with attempting to obtain a peripheral blood culture Could consider checking Procalcitonin Awaiting leukocyte count Agree with empiric treatment with Zosyn and Vancomycin empirically  ENDOCRINE A:   No acute issues.   P:   Awaiting serum lab result  NEUROLOGIC A:   No acute issues.  P:   Recommend close monitoring.   FAMILY  - Updates: Patient and emergency department provider updated at bedside.  DISCUSSION:  36 y.o. male with known history of MRSE  bacteremia and endocarditis status post mechanical mitral valve replacement and tricuspid ring. Patient chronically on systemic anticoagulation as well as antibiotic therapy. Clinical picture could be consistent with acute cor pulmonale versus acute congestive heart failure from decompensation of  left ventricle or valve replacement. An infectious process must be considered with his fever but other than his known endocarditis/bacteremia there is no other obvious source. I spoke with the emergency department provider recommending he contact the patient's surgeon at [facility] once all of his test results that have been ordered are available and determine whether or not they wish to evaluate the patient at their facility. Otherwise, he will contact our electronic ICU intensivist for admission to our facility. We would be happy to help take care of this patient.  I have spent a total of 34 minutes of critical care time today caring for the patient, discussing plan of care with emergency department provider, and reviewing the patient's electronic medical record.    Sonia Baller Ashok Cordia, M.D. [facility] Pulmonary & Critical Care Pager:  [phone] After 3pm or if no response, call [phone] 09/08/2016, 6:20 PM

## 2016-09-09 NOTE — ED Notes (Signed)
Pt has received first 578ml of 1L bolus and respiratory status unchanged, breath sounds clear. 2nd 500 bolus started then pt to start on bicarb drip per MD

## 2016-09-09 NOTE — ED Provider Notes (Signed)
[facility] DEPT Provider Note   CSN: [id number] Arrival date & time:        History   Chief Complaint Chief Complaint  Patient presents with  . Respiratory Distress    HPI PARK BECK is a 36 y.o. male. With history of SS disease, PE on xarelto, chronic hypoxic respiratory failure on 3L O2 at baseline, pulmonary HTN. 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 Port was thought to be source of infection, was also removed 7/16. PICC placed 7/24. He is still on daptomycin for staph epi bacteremia/endocarditis.  EMS called for respiratory distress. Patient found tachypneic, saturating 80% (unclear if was on home O2), GCS of 9. Treated with CPAP and transferred to [facility] for further care. Given 1 sublingual nitroglycerin with minimal improvement.   Mother reports onset of respiratory distress today. She denies patient having cough, vomiting, diarrhea, fevers or chills.   HPI  Past Medical History:  Diagnosis Date  . Acute chest syndrome ([place]) 06/18/2013  . Acute embolism and thrombosis of right internal jugular vein ([place])   . Alcohol consumption of one to four drinks per day   . Avascular necrosis (HCC)    Right Hip  . Blood transfusion   . Chronic anticoagulation   . Demand ischemia ([place]) 01/02/2014  . Endocarditis   . Former smoker   . Functional asplenia   . Hb-SS disease with crisis ([place])   . History of Clostridium difficile infection   . History of pulmonary embolus (PE)   . Hypertension   . Hypokalemia   . Leukocytosis    Chronic  . Mood disorder ([place])   . Noncompliance with medication regimen   . Oxygen deficiency   . Pulmonary hypertension ([place])   . Second hand tobacco smoke exposure   . Sickle cell anemia (HCC)   . Sickle-cell crisis with associated acute chest syndrome ([place]) 05/13/2013  . Stroke ([place])   . Thrombocytosis (HCC)    Chronic  . Uses marijuana     Patient Active Problem  List   Diagnosis Date Noted  . Acute on chronic respiratory failure with hypoxia ([place]) 09/21/2016  . Acute bacterial endocarditis   . Endocarditis of mitral valve 08/11/2016  . 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 Coagulase negative Staphylococcus bacteremia 08/05/2016  . PE (pulmonary thromboembolism) ([place]) 07/26/2016  . H/O arterial ischemic stroke 11/11/2015  . AKI (acute kidney injury) ([place])   . Prolonged Q-T interval on ECG 09/10/2014  . Cor pulmonale, chronic ([place]) 08/25/2014  . Sickle cell anemia ([place]) 06/25/2014  . Anemia of chronic disease 06/25/2014  . PAH (pulmonary artery hypertension) ([place]) 03/18/2014  . Chronic respiratory failure with hypoxia ([place]) 03/14/2014  . Paralytic strabismus, external ophthalmoplegia   . Chronic pain syndrome 12/12/2013  . Chronic anticoagulation 08/22/2013  . Essential hypertension 08/22/2013  . Pulmonary HTN ([place]) 06/18/2013  . Functional asplenia   . Vitamin D deficiency 02/13/2013  . 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 Avascular necrosis [facility])     Past Surgical History:  Procedure Laterality Date  . CHOLECYSTECTOMY     01/2008  . Excision of left periauricular cyst     10/2009  . Excision of right ear lobe cyst with primary closur     11/2007  . Porta cath placement    . Porta cath removal    . PORTACATH PLACEMENT  01/05/2012   Procedure: INSERTION PORT-A-CATH;  Surgeon: Odis Hollingshead, MD;  Location: [facility] OR;  Service: General;  Laterality: N/A;  ultrasound guiced port a cath insertion with fluoroscopy  . Right hip replacement     08/2006  . TEE WITHOUT CARDIOVERSION N/A 08/11/2016   Procedure: TRANSESOPHAGEAL ECHOCARDIOGRAM (TEE);  Surgeon: Lelon Perla, MD;  Location: [facility] ENDOSCOPY;  Service: Cardiovascular;  Laterality: N/A;  . UMBILICAL HERNIA REPAIR     01/2008       Home Medications    Prior to Admission medications   Medication Sig Start Date End Date Taking? Authorizing Provider  acetaminophen  (TYLENOL) 325 MG tablet Take 325 mg by mouth as needed. 09/07/16 09/17/16 Yes [provider]  amiodarone (PACERONE) 200 MG tablet Take 200 mg by mouth daily. 09/08/16 09/08/17 Yes [provider]  aspirin 81 MG chewable tablet Chew 81 mg by mouth daily.   Yes [provider]  cholecalciferol (VITAMIN D) 1000 units tablet Take 2,000 Units by mouth daily.   Yes [provider]  DAPTOmycin (CUBICIN) 500 MG injection Inject 500 mg into the vein every 30 (thirty) days. 09/07/16  Yes [provider]  folic acid (FOLVITE) 1 MG tablet Take 2 tablets (2 mg total) by mouth daily. Patient taking differently: Take 1 mg by mouth daily.  04/25/16  Yes [name], MD  furosemide (LASIX) 40 MG tablet Take 40 mg by mouth 2 (two) times daily. 09/07/16 09/07/17 Yes [provider]  gabapentin (NEURONTIN) 300 MG capsule Take 1 capsule (300 mg total) by mouth 3 (three) times daily. 06/01/15  Yes Tresa Garter, MD  HYDROmorphone (DILAUDID) 4 MG tablet Take 1 tablet (4 mg total) by mouth every 4 (four) hours as needed for severe pain. 09/07/16 09/22/16 Yes Scot Jun, FNP  hydroxyurea (HYDREA) 500 MG capsule Take 1,000 mg by mouth every other day. May take with food to minimize GI side effects. 06/24/16  Yes [provider]  morphine (MS CONTIN) 30 MG 12 hr tablet Take 1 tablet (30 mg total) by mouth every 12 (twelve) hours. Patient taking differently: Take 30 mg by mouth every 4 (four) hours.  09/07/16 10/07/16 Yes Scot Jun, FNP  polyethylene glycol (MIRALAX / GLYCOLAX) packet Take 17 g by mouth daily as needed. 09/07/16 09/10/16 Yes [provider]  potassium chloride SA (K-DUR,KLOR-CON) 20 MEQ tablet Take 2 tablets (40 mEq total) by mouth daily. 04/27/16  Yes [name], MD  senna-docusate (SENOKOT-S) 8.6-50 MG tablet Take 1 tablet by mouth daily as needed. 09/07/16 09/07/17 Yes [provider]  spironolactone (ALDACTONE) 25  MG tablet Take 12.5 mg by mouth daily. 09/07/16  Yes [provider]  warfarin (COUMADIN) 2 MG tablet Take 2 mg by mouth See admin instructions. Mon. Wed. fri take 4 mg ( TWO TABLETS) Tue. Thurs Sat and Sunday Take 5 MG (2.5 TABLETS) 09/07/16  Yes [provider]  zolpidem (AMBIEN) 10 MG tablet Take 1 tablet (10 mg total) by mouth at bedtime as needed for sleep. 09/07/16 10/07/16 Yes Scot Jun, FNP  carvedilol (COREG) 3.125 MG tablet Take 1 tablet (3.125 mg total) by mouth 2 (two) times daily with a meal. Patient not taking: Reported on 09/29/2016 05/17/16   [name], MD  L-glutamine (ENDARI) 5 g PACK Powder Packet Take 15 g by mouth 2 (two) times daily. Patient not taking: Reported on 09/15/2016 04/25/16   [name], MD  Rivaroxaban 15 & 20 MG TBPK Take as directed on package: Start with one 15mg  tablet  by mouth twice a day with food. On Day 22, switch to one 20mg  tablet once a day with food. Patient not taking: Reported on 09/20/2016 07/28/16   [name], MD  torsemide (DEMADEX) 20 MG tablet Take 1 tablet (20 mg total) by mouth daily as needed (leg swelling). Patient not taking: Reported on 09/11/2016 06/13/16   [name], MD  vancomycin (VANCOCIN) 1-5 GM/200ML-% SOLN Inject 200 mLs (1,000 mg total) into the vein every 12 (twelve) hours. Patient not taking: Reported on 09/26/2016 08/12/16   [name], MD    Family History Family History  Problem Relation Age of Onset  . Sickle cell trait Mother   . Depression Mother   . Diabetes Mother   . Sickle cell trait Father   . Sickle cell trait Brother     Social History Social History  Substance Use Topics  . Smoking status: Former Smoker    Packs/day: 0.50    Years: 10.00    Types: Cigarettes    Quit date: 05/29/2011  . Smokeless tobacco: Never Used  . Alcohol use No     Allergies   Patient has no known allergies.   Review of Systems Review of Systems    Constitutional: Positive for activity change, chills and fever.  HENT: Negative for ear pain and sore throat.   Eyes: Negative for pain and visual disturbance.  Respiratory: Positive for shortness of breath. Negative for cough.   Cardiovascular: Positive for leg swelling. Negative for chest pain and palpitations.  Gastrointestinal: Negative for abdominal pain and vomiting.  Genitourinary: Negative for dysuria and hematuria.  Musculoskeletal: Negative for arthralgias and back pain.  Skin: Negative for color change and rash.  Neurological: Negative for seizures and syncope.  All other systems reviewed and are negative.    Physical Exam Updated Vital Signs BP 107/80   Pulse 86   Temp 98.7 F (37.1 C) (Oral)   Resp 18   Ht 6\' 1"  (1.854 m)   Wt 91.3 kg (201 lb 3.2 oz)   SpO2 95%   BMI 26.55 kg/m   Physical Exam  Constitutional: He appears well-developed and well-nourished. He appears distressed.  HENT:  Head: Normocephalic and atraumatic.  Eyes: Conjunctivae are normal.  Neck: Neck supple.  Cardiovascular: Regular rhythm.  Tachycardia present.   No murmur heard. Pulmonary/Chest: He is in respiratory distress. He has rales. He exhibits no tenderness.  Sternotomy incision healing No obvious purulence  Abdominal: Soft. There is no tenderness.  Musculoskeletal: He exhibits edema (lower extremity pitting edema to thigh).  Neurological: He is alert. No cranial nerve deficit or sensory deficit. He exhibits normal muscle tone. Coordination normal.  Skin: Skin is warm and dry.  Psychiatric: He has a normal mood and affect.  Nursing note and vitals reviewed.    ED Treatments / Results  Labs (all labs ordered are listed, but only abnormal results are displayed) Labs Reviewed  COMPREHENSIVE METABOLIC PANEL - Abnormal; Notable for the following:       Result Value   Sodium 134 (*)    Potassium 6.1 (*)    Chloride 99 (*)    CO2 12 (*)    BUN 31 (*)    Creatinine, Ser 3.91 (*)     Albumin 3.4 (*)    AST 69 (*)    Alkaline Phosphatase 134 (*)    Total Bilirubin 4.1 (*)    GFR calc non Af Amer 18 (*)    GFR calc Af Wyvonnia Lora  21 (*)    Anion gap 23 (*)    All other components within normal limits  CBC WITH DIFFERENTIAL/PLATELET - Abnormal; Notable for the following:    WBC 23.7 (*)    RBC 2.39 (*)    Hemoglobin 7.6 (*)    HCT 23.0 (*)    RDW 19.9 (*)    Platelets 423 (*)    Neutro Abs 19.2 (*)    Monocytes Absolute 2.6 (*)    All other components within normal limits  PROTIME-INR - Abnormal; Notable for the following:    Prothrombin Time 67.8 (*)    INR 7.73 (*)    All other components within normal limits  URINALYSIS, ROUTINE W REFLEX MICROSCOPIC - Abnormal; Notable for the following:    APPearance HAZY (*)    Hgb urine dipstick SMALL (*)    Protein, ur 30 (*)    Leukocytes, UA TRACE (*)    Squamous Epithelial / LPF 0-5 (*)    All other components within normal limits  LACTIC ACID, PLASMA - Abnormal; Notable for the following:    Lactic Acid, Venous 5.3 (*)    All other components within normal limits  MAGNESIUM - Abnormal; Notable for the following:    Magnesium 2.5 (*)    All other components within normal limits  RAPID URINE DRUG SCREEN, HOSP PERFORMED - Abnormal; Notable for the following:    Opiates POSITIVE (*)    Tetrahydrocannabinol POSITIVE (*)    All other components within normal limits  BRAIN NATRIURETIC PEPTIDE - Abnormal; Notable for the following:    B Natriuretic Peptide >4,500.0 (*)    All other components within normal limits  I-STAT CG4 LACTIC ACID, ED - Abnormal; Notable for the following:    Lactic Acid, Venous 12.82 (*)    All other components within normal limits  I-STAT ARTERIAL BLOOD GAS, ED - Abnormal; Notable for the following:    pH, Arterial 7.211 (*)    pCO2 arterial 30.2 (*)    pO2, Arterial 125.0 (*)    Bicarbonate 11.8 (*)    Acid-base deficit 14.0 (*)    All other components within normal limits  I-STAT CG4  LACTIC ACID, ED - Abnormal; Notable for the following:    Lactic Acid, Venous 6.17 (*)    All other components within normal limits  CULTURE, BLOOD (ROUTINE X 2)  CULTURE, BLOOD (ROUTINE X 2)  PROCALCITONIN  SALICYLATE LEVEL  LACTIC ACID, PLASMA  LACTIC ACID, PLASMA  COMPREHENSIVE METABOLIC PANEL  CBC WITH DIFFERENTIAL/PLATELET  MAGNESIUM  PHOSPHORUS  PROTIME-INR  PROCALCITONIN  TYPE AND SCREEN    EKG  EKG Interpretation  Date/Time:  Friday September 09 2016 17:13:54 EDT Ventricular Rate:  130 PR Interval:    QRS Duration: 155 QT Interval:  375 QTC Calculation: 552 R Axis:   167 Text Interpretation:  Sinus tachycardia Nonspecific intraventricular conduction delay Non-specific ST-t changes Confirmed by Lajean Saver [id number]) on 09/15/2016 5:34:33 PM       Radiology Dg Chest Portable 1 View  Result Date: 09/13/2016 CLINICAL DATA:  PICC line placement. Respiratory distress. Ex-smoker. EXAM: PORTABLE CHEST 1 VIEW COMPARISON:  08/05/2016. FINDINGS: Left PICC tip at the junction of the superior vena cava and right atrium. Interval median sternotomy wires and prosthetic heart valve. Stable enlarged cardiac silhouette. Mild increase in prominence of the interstitial markings with an interval small right pleural effusion. Decreased inspiration with mild right basilar patchy opacity in minimal left basilar linear density. Unremarkable bones. IMPRESSION: 1. Left  PICC tip in satisfactory position. 2. Interval mild changes of congestive heart failure. 3. Poor inspiration with mild right basilar atelectasis and minimal left basilar atelectasis. 4. Stable cardiomegaly. Electronically Signed   By: Claudie Revering M.D.   On: 09/10/2016 17:43    Procedures Procedures (including critical care time)  Medications Ordered in ED Medications  acetaminophen (TYLENOL) suppository 650 mg (650 mg Rectal Not Given 09/25/2016 1818)  piperacillin-tazobactam (ZOSYN) IVPB 3.375 g (not administered)  vancomycin  (VANCOCIN) IVPB 1000 mg/200 mL premix (not administered)  0.9 %  sodium chloride infusion (not administered)  sodium bicarbonate 150 mEq in sterile water 1,000 mL infusion ( Intravenous New Bag/Given 09/20/2016 2337)  hydrALAZINE (APRESOLINE) injection 10 mg (not administered)  oxyCODONE-acetaminophen (PERCOCET/ROXICET) 5-325 MG per tablet 1 tablet (not administered)  furosemide (LASIX) injection 40 mg (40 mg Intravenous Given 09/15/2016 1735)  piperacillin-tazobactam (ZOSYN) IVPB 3.375 g (0 g Intravenous Stopped 09/13/2016 1815)  vancomycin (VANCOCIN) 2,000 mg in sodium chloride 0.9 % 500 mL IVPB (0 mg Intravenous Stopped 09/07/2016 2020)  sodium chloride 0.9 % bolus 1,000 mL (0 mLs Intravenous Stopped 09/21/2016 1844)  acetaminophen (TYLENOL) suppository 650 mg (650 mg Rectal Given 09/23/2016 1810)  sodium bicarbonate injection 50 mEq (50 mEq Intravenous Given 09/30/2016 2115)     Initial Impression / Assessment and Plan / ED Course  I have reviewed the triage vital signs and the nursing notes.  Pertinent labs & imaging results that were available during my care of the patient were reviewed by me and considered in my medical decision making (see chart for details).     Patient is a 36 y/o male with complex medical history, including history of SS disease, PE on xarelto, chronic hypoxic respiratory failure on 3L O2 at baseline, pulmonary HTN, staph bacteremia with MV endocarditis s/p MVR and TV annulus repair 7/16, who presented with altered mental status and acute on chronic hypoxic respiratory failure. Patient treated by EMS was CPAP, and was immediately transitioned to bipap upon arrival. Patient arrived in acute distress, GCS improved to 12 was CPAP and BiPAP.   Initially concerned for fluid overload and pulmonary edema, nitroglycerin drip and Lasix ordered. However after patient found to be febrile, tachycardic, with lactic acidosis and leukocytosis. Clinical exam more consistent with sepsis, and previous  orders discontinued.  Code sepsis initiated with broad-spectrum antibiotics. Single culture obtained from left PICC line prior to initiation of antibiotics. Additional lab abnormalities metabolic acidosis with AG 23, lactate 12, bicarb 12, AKI with Cr elevated to 3.9.   Discussed with critical care for admission to ICU. Patient did have significant improvement in respiratory status with BiPAP and antibiotics. He was able to be  transitioned to nonrebreather. He was able to respond to questions, GCS improving to a 15. Question of patient should be transferred to [facility] given his recent valve replacement, however I feel the patient is not stable for transfer at this time. His symptoms seemed to be secondary to sepsis and metabolic derangements, and less from valvular or heart pathology.  Patient admitted to ICU for further care.   Patient and plan of care discussed with Attending physician, Dr. Ashok Cordia.     Final Clinical Impressions(s) / ED Diagnoses   Final diagnoses:  Hypoxia  Sepsis, due to unspecified organism ([place])  Acidosis  Acute on chronic heart failure, unspecified heart failure type [facility])    New Prescriptions Current Discharge Medication List       Arnetha Massy, MD 09/10/16 0032    Ashok Cordia,  Lennette Bihari, MD 09/10/16 1539

## 2016-09-09 NOTE — ED Notes (Signed)
Echo being done in room. Pt to be transported up to 4N when complete.

## 2016-09-09 NOTE — ED Notes (Signed)
SEPSIS  Activated @ [id number]  (RN aware)

## 2016-09-09 NOTE — ED Notes (Signed)
Lab called and gave Critical INR of 7.78, ICU MD at bedside and informed of results. Pt tolerating NRB mask well, still tachpneic, no respiratory status change.

## 2016-09-09 NOTE — Progress Notes (Signed)
  Echocardiogram 2D Echocardiogram has been performed.  [name] 09/08/2016, 9:56 PM

## 2016-09-09 NOTE — Progress Notes (Signed)
PCCM Attending Note: Contacted by electronic ICU intensivist. Emergency department provider uncomfortable with contacting [facility]. Labs that have now resulted reviewed showing hemolysis with artificial hyperkalemia. Patient does have acute renal failure. Leukocytosis consistent with possible infectious process. Admission orders placed. Contacting cardiology for stat echocardiogram. Electronic ICU intensivist contact [place] depending upon echocardiogram result.  Sonia Baller Ashok Cordia, M.D. [facility] Pulmonary & Critical Care Pager:  [phone] After 3pm or if no response, call [phone] 8:03 PM 09/30/2016

## 2016-09-10 ENCOUNTER — Inpatient Hospital Stay (HOSPITAL_COMMUNITY): Payer: Medicare Other

## 2016-09-10 ENCOUNTER — Telehealth: Payer: Self-pay | Admitting: Nurse Practitioner

## 2016-09-10 DIAGNOSIS — I272 Pulmonary hypertension, unspecified: Secondary | ICD-10-CM | POA: Diagnosis not present

## 2016-09-10 DIAGNOSIS — J9621 Acute and chronic respiratory failure with hypoxia: Secondary | ICD-10-CM | POA: Diagnosis not present

## 2016-09-10 DIAGNOSIS — N179 Acute kidney failure, unspecified: Secondary | ICD-10-CM

## 2016-09-10 DIAGNOSIS — I38 Endocarditis, valve unspecified: Secondary | ICD-10-CM

## 2016-09-10 DIAGNOSIS — I5021 Acute systolic (congestive) heart failure: Secondary | ICD-10-CM | POA: Diagnosis not present

## 2016-09-10 DIAGNOSIS — J9600 Acute respiratory failure, unspecified whether with hypoxia or hypercapnia: Secondary | ICD-10-CM | POA: Diagnosis not present

## 2016-09-10 DIAGNOSIS — I2609 Other pulmonary embolism with acute cor pulmonale: Secondary | ICD-10-CM

## 2016-09-10 DIAGNOSIS — D6832 Hemorrhagic disorder due to extrinsic circulating anticoagulants: Secondary | ICD-10-CM | POA: Diagnosis not present

## 2016-09-10 DIAGNOSIS — I5041 Acute combined systolic (congestive) and diastolic (congestive) heart failure: Secondary | ICD-10-CM | POA: Diagnosis not present

## 2016-09-10 DIAGNOSIS — E872 Acidosis: Secondary | ICD-10-CM | POA: Diagnosis not present

## 2016-09-10 DIAGNOSIS — I2699 Other pulmonary embolism without acute cor pulmonale: Secondary | ICD-10-CM | POA: Diagnosis not present

## 2016-09-10 DIAGNOSIS — A4102 Sepsis due to Methicillin resistant Staphylococcus aureus: Secondary | ICD-10-CM | POA: Diagnosis not present

## 2016-09-10 LAB — COMPREHENSIVE METABOLIC PANEL
ALBUMIN: 3.2 g/dL — AB (ref 3.5–5.0)
ALT: 31 U/L (ref 17–63)
ANION GAP: 14 (ref 5–15)
AST: 54 U/L — AB (ref 15–41)
Alkaline Phosphatase: 105 U/L (ref 38–126)
BUN: 36 mg/dL — AB (ref 6–20)
CHLORIDE: 100 mmol/L — AB (ref 101–111)
CO2: 22 mmol/L (ref 22–32)
Calcium: 8.4 mg/dL — ABNORMAL LOW (ref 8.9–10.3)
Creatinine, Ser: 3.72 mg/dL — ABNORMAL HIGH (ref 0.61–1.24)
GFR calc Af Amer: 22 mL/min — ABNORMAL LOW (ref >60)
GFR, EST NON AFRICAN AMERICAN: 19 mL/min — AB (ref >60)
Glucose, Bld: 105 mg/dL — ABNORMAL HIGH (ref 65–99)
POTASSIUM: 6.2 mmol/L — AB (ref 3.5–5.1)
Sodium: 136 mmol/L (ref 135–145)
Total Bilirubin: 3.1 mg/dL — ABNORMAL HIGH (ref 0.3–1.2)
Total Protein: 7.1 g/dL (ref 6.5–8.1)

## 2016-09-10 LAB — BASIC METABOLIC PANEL
ANION GAP: 12 (ref 5–15)
BUN: 38 mg/dL — ABNORMAL HIGH (ref 6–20)
CO2: 24 mmol/L (ref 22–32)
Calcium: 8.4 mg/dL — ABNORMAL LOW (ref 8.9–10.3)
Chloride: 99 mmol/L — ABNORMAL LOW (ref 101–111)
Creatinine, Ser: 3.51 mg/dL — ABNORMAL HIGH (ref 0.61–1.24)
GFR calc Af Amer: 24 mL/min — ABNORMAL LOW (ref >60)
GFR, EST NON AFRICAN AMERICAN: 21 mL/min — AB (ref >60)
Glucose, Bld: 95 mg/dL (ref 65–99)
POTASSIUM: 4.8 mmol/L (ref 3.5–5.1)
SODIUM: 135 mmol/L (ref 135–145)

## 2016-09-10 LAB — CBC WITH DIFFERENTIAL/PLATELET
BASOS ABS: 0 10*3/uL (ref 0.0–0.1)
Basophils Relative: 0 %
EOS PCT: 0 %
Eosinophils Absolute: 0 10*3/uL (ref 0.0–0.7)
HEMATOCRIT: 20 % — AB (ref 39.0–52.0)
HEMOGLOBIN: 6.7 g/dL — AB (ref 13.0–17.0)
LYMPHS ABS: 1.3 10*3/uL (ref 0.7–4.0)
LYMPHS PCT: 6 %
MCH: 31.8 pg (ref 26.0–34.0)
MCHC: 33.5 g/dL (ref 30.0–36.0)
MCV: 94.8 fL (ref 78.0–100.0)
MONOS PCT: 14 %
Monocytes Absolute: 3 10*3/uL — ABNORMAL HIGH (ref 0.1–1.0)
Neutro Abs: 17.1 10*3/uL — ABNORMAL HIGH (ref 1.7–7.7)
Neutrophils Relative %: 80 %
Platelets: 358 10*3/uL (ref 150–400)
RBC: 2.11 MIL/uL — ABNORMAL LOW (ref 4.22–5.81)
RDW: 20.2 % — ABNORMAL HIGH (ref 11.5–15.5)
WBC: 21.4 10*3/uL — AB (ref 4.0–10.5)

## 2016-09-10 LAB — PROTIME-INR
INR: 5.83 — AB
PROTHROMBIN TIME: 54.1 s — AB (ref 11.4–15.2)

## 2016-09-10 LAB — PREPARE RBC (CROSSMATCH)

## 2016-09-10 LAB — PHOSPHORUS: PHOSPHORUS: 6.6 mg/dL — AB (ref 2.5–4.6)

## 2016-09-10 LAB — LACTIC ACID, PLASMA: LACTIC ACID, VENOUS: 1.1 mmol/L (ref 0.5–1.9)

## 2016-09-10 LAB — RETICULOCYTES
RBC.: 2.13 MIL/uL — ABNORMAL LOW (ref 4.22–5.81)
RETIC COUNT ABSOLUTE: 242.8 10*3/uL — AB (ref 19.0–186.0)
Retic Ct Pct: 11.4 % — ABNORMAL HIGH (ref 0.4–3.1)

## 2016-09-10 LAB — PROCALCITONIN
Procalcitonin: 4.81 ng/mL
Procalcitonin: 12.3 ng/mL

## 2016-09-10 LAB — MAGNESIUM: Magnesium: 2.5 mg/dL — ABNORMAL HIGH (ref 1.7–2.4)

## 2016-09-10 MED ORDER — OXYCODONE-ACETAMINOPHEN 5-325 MG PO TABS
1.0000 | ORAL_TABLET | ORAL | Status: DC | PRN
  Administered 2016-09-10 – 2016-09-12 (×6): 1 via ORAL
  Filled 2016-09-10 (×7): qty 1

## 2016-09-10 MED ORDER — VANCOMYCIN HCL IN DEXTROSE 1-5 GM/200ML-% IV SOLN
1000.0000 mg | INTRAVENOUS | Status: DC
  Administered 2016-09-10 – 2016-09-13 (×4): 1000 mg via INTRAVENOUS
  Filled 2016-09-10 (×4): qty 200

## 2016-09-10 MED ORDER — PIPERACILLIN-TAZOBACTAM 3.375 G IVPB
3.3750 g | Freq: Three times a day (TID) | INTRAVENOUS | Status: DC
  Administered 2016-09-11: 3.375 g via INTRAVENOUS
  Filled 2016-09-10 (×3): qty 50

## 2016-09-10 MED ORDER — SODIUM CHLORIDE 0.9 % IV SOLN
Freq: Once | INTRAVENOUS | Status: AC
  Administered 2016-09-10: 17:00:00 via INTRAVENOUS

## 2016-09-10 MED ORDER — ORAL CARE MOUTH RINSE
15.0000 mL | Freq: Two times a day (BID) | OROMUCOSAL | Status: DC
  Administered 2016-09-10 – 2016-09-13 (×4): 15 mL via OROMUCOSAL

## 2016-09-10 MED ORDER — SODIUM POLYSTYRENE SULFONATE 15 GM/60ML PO SUSP
15.0000 g | Freq: Once | ORAL | Status: AC
  Administered 2016-09-10: 15 g via ORAL
  Filled 2016-09-10: qty 60

## 2016-09-10 NOTE — Progress Notes (Addendum)
PULMONARY / CRITICAL CARE MEDICINE   Name: [name] MRN: [id number] DOB: 1980-04-21    ADMISSION DATE:  08/31/2016 CONSULTATION DATE:  09/22/2016  REFERRING MD:  Arnetha Massy, M.D. / ED Resident  CHIEF COMPLAINT:  Lactic Acidosis  HISTORY OF PRESENT ILLNESS:   36 y.o. male with known history of sickle cell anemia and pulmonary hypertension with prior h/o f pulmonary embolism. Was found to have a MRSA bacteremia in July with blood cultures done at [facility]. Patient was evaluated by ID that recommended removal of port. The patient remained in place until transfer to do. He was ultimately found to have a large vegetation measuring at 2.6 cm in maximal dimension on transesophageal echocardiogram on his mitral valve with a possible PFO. Patient is followed by post hematology and pulmonology at [facility] and therefore transferred to [facility] was felt to be most appropriate. Patient underwent mechanical mitral valve replacement at [facility] as well as tricuspid valve ring on 7/17   Adm 8/10 with resp distress with severe  Lactic acidosis & coagulopathy  SUBJECTIVE:  Remains on NRB but satn 100% C/o pain in ribs afebrile  VITAL SIGNS: BP 105/69   Pulse 73   Temp 97.6 F (36.4 C) (Axillary)   Resp 15   Ht 6\' 1"  (1.854 m)   Wt 201 lb 3.2 oz (91.3 kg)   SpO2 100%   BMI 26.55 kg/m   HEMODYNAMICS:    VENTILATOR SETTINGS: Vent Mode: BIPAP;PCV FiO2 (%):  [50 %-100 %] 100 % Set Rate:  [15 bmp] 15 bmp PEEP:  [3 cmH20] 3 cmH20  INTAKE / OUTPUT: I/O last 3 completed shifts: In: 2019.2 [I.V.:369.2; IV Piggyback:1650] Out: 3382 [NKNLZ:7673]  PHYSICAL EXAMINATION: General:  Awake. Alert. No distress. On NRB Integument:  Warm & dry. No rash on exposed skin. Multiple tattoos noted. Extremities:  No cyanosis or clubbing.  Lymphatics:  No appreciated cervical or supraclavicular lymphadenoapthy. HEENT: No scleral injection  or icterus. Pupils symmetric. Cardiovascular:  Regular rhythm. Pitting lower extremity edema to the knees. Unable to appreciate JVD.  Pulmonary:  Slightly diminished breath sounds in the bases. Otherwise clear with auscultation. Mild to moderately increased work of breathing on nonrebreather mask. Abdomen: Soft. Normal bowel sounds. Mildly protuberant. Grossly nontender. Musculoskeletal:  Normal bulk and tone. Hand grip strength 5/5 bilaterally. No joint deformity, erythema, or effusion appreciated. Neurological:  CN 2-12 grossly in tact. No meningismus. Moving all 4 extremities equally. Drowsy , wakes up to name   LABS:  BMET  Recent Labs Lab 09/18/2016 1717 09/10/16 0528  NA 134* 136  K 6.1* 6.2*  CL 99* 100*  CO2 12* 22  BUN 31* 36*  CREATININE 3.91* 3.72*  GLUCOSE 87 105*    Electrolytes  Recent Labs Lab 09/27/2016 1717 09/26/2016 2053 09/10/16 0528  CALCIUM 8.9  --  8.4*  MG  --  2.5* 2.5*  PHOS  --   --  6.6*    CBC  Recent Labs Lab 09/30/2016 1717 09/10/16 0528  WBC 23.7* 21.4*  HGB 7.6* 6.7*  HCT 23.0* 20.0*  PLT 423* 358    Coag's  Recent Labs Lab 09/15/2016 1717 09/10/16 0528  INR 7.73* 5.83*    Sepsis Markers  Recent Labs Lab 09/26/2016 2048 09/17/2016 2053 09/10/16 0528 09/10/16 0529  LATICACIDVEN 6.17* 5.3*  --  1.1  PROCALCITON  --  4.81 12.30  --     ABG  Recent Labs Lab 09/14/2016 1730  PHART 7.211*  PCO2ART 30.2*  PO2ART 125.0*    Liver Enzymes  Recent Labs Lab 09/19/2016 1717 09/10/16 0528  AST 69* 54*  ALT 29 31  ALKPHOS 134* 105  BILITOT 4.1* 3.1*  ALBUMIN 3.4* 3.2*    Cardiac Enzymes No results for input(s): TROPONINI, PROBNP in the last 168 hours.  Glucose No results for input(s): GLUCAP in the last 168 hours.  Imaging US Renal  Result Date: 09/10/2016 CLINICAL DATA:  Acute renal failure. History of hypertension sickle cell anemia. EXAM: RENAL / URINARY TRACT ULTRASOUND COMPLETE COMPARISON:  CT abdomen and  pelvis July 26, 2016 FINDINGS: Right Kidney: Length: 13.6 cm. Echogenicity within normal limits. No mass or hydronephrosis visualized. Multifocal scarring. Left Kidney: Length: 12.6 cm. Echogenicity within normal limits. No mass or hydronephrosis visualized. Multifocal scarring. Bladder: Appears normal for degree of bladder distention. IMPRESSION: 1. No obstructive uropathy. 2. Cortical scarring, better demonstrated on prior CT. Electronically Signed   By: Elon Alas M.D.   On: 09/10/2016 01:09   Dg Chest Port 1 View  Result Date: 09/10/2016 CLINICAL DATA:  Acute respiratory failure. Acute respiratory failure. EXAM: PORTABLE CHEST 1 VIEW COMPARISON:  09/18/2016 FINDINGS: The heart is enlarged but stable. Prominent mediastinal and hilar contours again demonstrated. Improved lung aeration in part due to better lung volumes. Resolving perihilar pulmonary edema. Persistent areas of atelectasis and small effusions. No pneumothorax. IMPRESSION: Stable cardiac enlargement and prominent pulmonary arteries. Improved lung aeration with resolving pulmonary edema and atelectasis. Electronically Signed   By: Marijo Sanes M.D.   On: 09/10/2016 08:11   Dg Chest Portable 1 View  Result Date: 09/14/2016 CLINICAL DATA:  PICC line placement. Respiratory distress. Ex-smoker. EXAM: PORTABLE CHEST 1 VIEW COMPARISON:  08/05/2016. FINDINGS: Left PICC tip at the junction of the superior vena cava and right atrium. Interval median sternotomy wires and prosthetic heart valve. Stable enlarged cardiac silhouette. Mild increase in prominence of the interstitial markings with an interval small right pleural effusion. Decreased inspiration with mild right basilar patchy opacity in minimal left basilar linear density. Unremarkable bones. IMPRESSION: 1. Left PICC tip in satisfactory position. 2. Interval mild changes of congestive heart failure. 3. Poor inspiration with mild right basilar atelectasis and minimal left basilar  atelectasis. 4. Stable cardiomegaly. Electronically Signed   By: Claudie Revering M.D.   On: 09/19/2016 17:43   reviewed  STUDIES:    MICROBIOLOGY: Blood Cultures x2 6/26:  Negative Blood Culture x1 7/5 (port-a-cath):  MRSE Blood Culture x1 7/6 (port-a-cath):  MRSE Blood Culture x1 7/6 (peripheral):  MRSE Urine Culture 7/7:  Negative Blood Cultures x2 7/11 (peripheral):  Negative  Blood Culture x1 8/10 (from PICC) >>>  ANTIBIOTICS: Daptomycin (outpt) >>> (end date 8/27) Vancomycin 8/10 >>> Zosyn 8/10 >>>  SIGNIFICANT EVENTS: 07/06 - 07/15 - Admitted for Acute Bacterial Endocarditis of Tricuspid and Mitral Valves & Pulmonary Embolism 07/17 - Mechanical Mitral Valve Replacement and Tricuspid Valve Ring at [facility] 08/10 - Presented to ED at [facility]   LINES/TUBES: LUE SL PICC 7/31 >>>  ASSESSMENT / PLAN:  PULMONARY A: Acute on Chronic Hypoxic Respiratory Failure:  3 L/m baseline. Unclear etiology Pulmonary Hypertension H/O Pulmonary Emboli   P:  Dial down to [place], satn 88% & above  CARDIOVASCULAR A:  Pulmonary Hypertension Tricuspid and Mitral Valve Endocarditis:  S/P mechanical mitral valve replacement & tricuspid valve ring 7/17 at [facility].  P:  cardiology consultation   RENAL A:   Lactic Acidosis: resolved AKI  Hyperkalemia  P:   ct Lasix 40 q 12h Dc bicarbonate drip  Hold aldactone Kayexalate x1  GASTROINTESTINAL A:   No acute issues.  P:   Clear liquids & advance to renal diet if tolerated  HEMATOLOGIC A:   Sickle Cell Anemia: Patient with baseline anemia. Coagulopathy: Likely secondary to systemic anticoagulation with Coumadin.  P:  holding Coumadin, INR decreasing Chk retic count, doubt crisis, hold hydrea for now  INFECTIOUS A:   Possible Sepsis H/O MRSE Bacteremia & Endocarditis:  On Daptomycin to 8/27 from [facility].  P:   Await cx data Ct  empiric Zosyn and Vancomycin     NEUROLOGIC A:   Chronic  pain  P:   Doubt active crisis Hold dilaudid & MS contin (with renal failure) until he is more awake   FAMILY  - Updates: Patient and emergency department provider updated at bedside.  DISCUSSION:  36 y.o. male with known history of MRSE bacteremia and endocarditis status post mechanical mitral valve replacement and tricuspid ring. Patient chronically on systemic anticoagulation as well as antibiotic therapy. Clinical picture could be consistent with acute cor pulmonale versus acute congestive heart failure from decompensation of left ventricle or valve replacement.  Seems to have improved, expect renal failure to resolve  My cc time x 47m  Kara Mead MD. [facility]. [place] [facility] Pager [phone] If no response call [phone]    09/10/2016, 10:05 AM

## 2016-09-10 NOTE — Consult Note (Addendum)
Cardiology Consultation:   Patient ID: [name]; [id number]; May 02, 1980   Admit date: 09/21/2016 Date of Consult: 09/10/2016  Primary Care Provider: Tresa Garter, MD Primary Cardiologist: [facility]   Patient Profile:   [name] is a 36 y.o. male with a hx of Sickle cell disease, endocarditis status post mitral valve replacement and tricuspid valve ring who is being seen today for the evaluation of heart failure at the request of [place].  History of Present Illness:   [name] is a 36 year old male with a history of endocarditis status post tricuspid valve ring and mitral valve replacement, pulmonary embolism on chronic anticoagulation, and sickle cell disease who presented to the hospital with increasing lower extremity edema. Currently, he denies chest pain, shortness of breath. He says that his breathing has improved since being admitted to the hospital. His initial labs showed a BMP greater than 4500. He also had an elevated white count with cultures pending currently on antibiotics. In July, he had MRSA bacteremia and was found to have a vegetation on his mitral valve. This resulted in mitral valve replacement and tricuspid valve ring in placement. He is followed at [facility] by hematology and pulmonology.  Past Medical History:  Diagnosis Date  . Acute chest syndrome ([place]) 06/18/2013  . Acute embolism and thrombosis of right internal jugular vein ([place])   . Alcohol consumption of one to four drinks per day   . Avascular necrosis (HCC)    Right Hip  . Blood transfusion   . Chronic anticoagulation   . Demand ischemia ([place]) 01/02/2014  . Endocarditis   . Former smoker   . Functional asplenia   . Hb-SS disease with crisis ([place])   . History of Clostridium difficile infection   . History of pulmonary embolus (PE)   . Hypertension   . Hypokalemia   . Leukocytosis    Chronic  . Mood disorder ([place])   . Noncompliance with medication regimen   . Oxygen deficiency   .  Pulmonary hypertension ([place])   . Second hand tobacco smoke exposure   . Sickle cell anemia (HCC)   . Sickle-cell crisis with associated acute chest syndrome ([place]) 05/13/2013  . Stroke ([place])   . Thrombocytosis (HCC)    Chronic  . Uses marijuana     Past Surgical History:  Procedure Laterality Date  . CHOLECYSTECTOMY     01/2008  . Excision of left periauricular cyst     10/2009  . Excision of right ear lobe cyst with primary closur     11/2007  . Porta cath placement    . Porta cath removal    . PORTACATH PLACEMENT  01/05/2012   Procedure: INSERTION PORT-A-CATH;  Surgeon: Odis Hollingshead, MD;  Location: [facility];  Service: General;  Laterality: N/A;  ultrasound guiced port a cath insertion with fluoroscopy  . Right hip replacement     08/2006  . TEE WITHOUT CARDIOVERSION N/A 08/11/2016   Procedure: TRANSESOPHAGEAL ECHOCARDIOGRAM (TEE);  Surgeon: Lelon Perla, MD;  Location: [facility] ENDOSCOPY;  Service: Cardiovascular;  Laterality: N/A;  . UMBILICAL HERNIA REPAIR     01/2008     Inpatient Medications: Scheduled Meds: . acetaminophen  650 mg Rectal Once  . mouth rinse  15 mL Mouth Rinse BID   Continuous Infusions: . sodium chloride    . piperacillin-tazobactam (ZOSYN)  IV 3.375 g (09/10/16 1029)  . vancomycin     PRN Meds: sodium chloride, hydrALAZINE, oxyCODONE-acetaminophen  Allergies:   No  Known Allergies  Social History:   Social History   Social History  . Marital status: Single    Spouse name: N/A  . Number of children: 0  . Years of education: 49   Occupational History  . 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 Smoking status: Former Smoker    Packs/day: 0.50    Years: 10.00    Types: Cigarettes    Quit date: 05/29/2011  . Smokeless tobacco: Never Used  . Alcohol use No  . Drug use: No     Comment: Marijuana weekly  . Sexual activity: Yes    Partners: Female    Birth control/ protection:  Condom     Comment: month ago   Other Topics Concern  . Not on file   Social History Narrative   Lives in an apartment.  Single.  Lives alone but has a girlfriend that helps care for him.  Does not use any assist devices.        [name]:  [phone] Mom, emergency contact      [facility]:   Patient continuing to live in her apartment in town alone. 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    Family History:    Family History  Problem Relation Age of Onset  . Sickle cell trait Mother   . Depression Mother   . Diabetes Mother   . Sickle cell trait Father   . Sickle cell trait Brother      ROS:  Please see the history of present illness.  ROS  All other ROS reviewed and negative.     Physical Exam/Data:   Vitals:   09/10/16 1030 09/10/16 1100 09/10/16 1130 09/10/16 1200  BP: 118/78 104/70 106/62 124/79  Pulse: 76 (!) 154 75 79  Resp: 20 15 19 18   Temp:    97.7 F (36.5 C)  TempSrc:      SpO2: 100% 100% 100% 96%  Weight:      Height:        Intake/Output Summary (Last 24 hours) at 09/10/16 1242 Last data filed at 09/10/16 1200  Gross per 24 hour  Intake          2289.17 ml  Output             1575 ml  Net           714.17 ml   Filed Weights   09/28/2016 1716 09/14/2016 2230 09/10/16 0232  Weight: 179 lb (81.2 kg) 201 lb 3.2 oz (91.3 kg) 201 lb 3.2 oz (91.3 kg)   Body mass index is 26.55 kg/m.  General:  Well nourished, well developed, in no acute distress, Drowsy HEENT: normal Lymph: no adenopathy Neck: no JVD Endocrine:  No thryomegaly Vascular: No carotid bruits; FA pulses 2+ bilaterally without bruits  Cardiac:  normal S1, S2; RRR; no murmur  Lungs:  clear to auscultation bilaterally, no wheezing, rhonchi or rales  Abd: soft, nontender, no hepatomegaly  Ext: no edema Musculoskeletal:  No deformities, BUE and BLE strength normal and equal Skin: warm and dry  Neuro:  CNs 2-12 intact, no focal abnormalities noted Psych:  Normal affect    EKG:  The EKG was personally reviewed and demonstrates:  Sinus tachycardia Telemetry:  Telemetry was personally reviewed and demonstrates:  Sinus rhythm  Relevant CV Studies: TTE  - Left ventricle: The cavity size was normal. Wall thickness was  increased in a pattern of moderate LVH. Systolic function was   mildly reduced. The estimated ejection fraction was in the range   of 45% to 50%. Diffuse hypokinesis. The study is not technically   sufficient to allow evaluation of LV diastolic function. - Ventricular septum: The contour showed diastolic flattening and   systolic flattening. - Mitral valve: Mechanical prosthesis. Normal tilting bileaflet   motion. No obstruction. Trivial MR. - Left atrium: The atrium was normal in size. - Right ventricle: Massively dilated with moderate RV systolic   dysfunction. - Right atrium: Severely dilated, - Tricuspid valve: Annuloplasty ring in place. Trivial TR. No   vegetation. - Pulmonic valve: There was mild regurgitation. - Pulmonary arteries: PA peak pressure: 41 mm Hg (S). - Inferior vena cava: The vessel was dilated. The respirophasic   diameter changes were blunted (< 50%), consistent with elevated   central venous pressure.  Laboratory Data:  Chemistry Recent Labs Lab 09/08/2016 1717 October 06, 2016 0528  NA 134* 136  K 6.1* 6.2*  CL 99* 100*  CO2 12* 22  GLUCOSE 87 105*  BUN 31* 36*  CREATININE 3.91* 3.72*  CALCIUM 8.9 8.4*  GFRNONAA 18* 19*  GFRAA 21* 22*  ANIONGAP 23* 14     Recent Labs Lab 09/18/2016 1717 October 06, 2016 0528  PROT 8.1 7.1  ALBUMIN 3.4* 3.2*  AST 69* 54*  ALT 29 31  ALKPHOS 134* 105  BILITOT 4.1* 3.1*   Hematology Recent Labs Lab 09/08/2016 1717 10-06-2016 0528  WBC 23.7* 21.4*  RBC 2.39* 2.11*  2.13*  HGB 7.6* 6.7*  HCT 23.0* 20.0*  MCV 96.2 94.8  MCH 31.8 31.8  MCHC 33.0 33.5  RDW 19.9* 20.2*  PLT 423* 358   Cardiac EnzymesNo results for input(s): TROPONINI in the last 168 hours. No results for  input(s): TROPIPOC in the last 168 hours.  BNP Recent Labs Lab 09/11/2016 2053  BNP >4,500.0*    DDimer No results for input(s): DDIMER in the last 168 hours.  Radiology/Studies:  US Renal  Result Date: 06-Oct-2016 CLINICAL DATA:  Acute renal failure. History of hypertension sickle cell anemia. EXAM: RENAL / URINARY TRACT ULTRASOUND COMPLETE COMPARISON:  CT abdomen and pelvis July 26, 2016 FINDINGS: Right Kidney: Length: 13.6 cm. Echogenicity within normal limits. No mass or hydronephrosis visualized. Multifocal scarring. Left Kidney: Length: 12.6 cm. Echogenicity within normal limits. No mass or hydronephrosis visualized. Multifocal scarring. Bladder: Appears normal for degree of bladder distention. IMPRESSION: 1. No obstructive uropathy. 2. Cortical scarring, better demonstrated on prior CT. Electronically Signed   By: Elon Alas M.D.   On: 06-Oct-2016 01:09   Dg Chest Port 1 View  Result Date: October 06, 2016 CLINICAL DATA:  Acute respiratory failure. Acute respiratory failure. EXAM: PORTABLE CHEST 1 VIEW COMPARISON:  09/05/2016 FINDINGS: The heart is enlarged but stable. Prominent mediastinal and hilar contours again demonstrated. Improved lung aeration in part due to better lung volumes. Resolving perihilar pulmonary edema. Persistent areas of atelectasis and small effusions. No pneumothorax. IMPRESSION: Stable cardiac enlargement and prominent pulmonary arteries. Improved lung aeration with resolving pulmonary edema and atelectasis. Electronically Signed   By: Marijo Sanes M.D.   On: 2016/10/06 08:11   Dg Chest Portable 1 View  Result Date: 09/29/2016 CLINICAL DATA:  PICC line placement. Respiratory distress. Ex-smoker. EXAM: PORTABLE CHEST 1 VIEW COMPARISON:  08/05/2016. FINDINGS: Left PICC tip at the junction of the superior vena cava and right atrium. Interval median sternotomy wires and prosthetic heart valve. Stable enlarged cardiac silhouette. Mild increase  in prominence of the  interstitial markings with an interval small right pleural effusion. Decreased inspiration with mild right basilar patchy opacity in minimal left basilar linear density. Unremarkable bones. IMPRESSION: 1. Left PICC tip in satisfactory position. 2. Interval mild changes of congestive heart failure. 3. Poor inspiration with mild right basilar atelectasis and minimal left basilar atelectasis. 4. Stable cardiomegaly. Electronically Signed   By: Claudie Revering M.D.   On: 09/15/2016 17:43    Assessment and Plan:   1. 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 No further changes. 2. Mild systolic heart failure: Likely nonischemic. Has signs of volume overload with an elevated BNP. Agree with diuresis. Is currently in acute renal failure. Creatinine has improved with diuresis.It is unclear as to the cause of his heart failure, as we do not have records from possible heart catheterization prior to surgery. 3. Acute renal failure: Creatinine is elevated significantly from baseline of around 1. Currently patient is getting diuresed, with an elevated BNP. Agree with continued diuresis. 4. Pulmonary hypertension: Likely the cause of his RV systolic dysfunction. Likely Nizar Cutler improve as his diuresis continues. 5. Sepsis: Cultures are pending. Antibiotics per primary team. Echo shows no evidence of vegetation on his valves. Should blood cultures become positive, would likely benefit from a TEE.   Signed, Aysen Shieh Meredith Leeds, MD  09/10/2016 12:42 PM

## 2016-09-10 NOTE — Telephone Encounter (Signed)
error 

## 2016-09-10 NOTE — Progress Notes (Signed)
eLink Physician-Brief Progress Note Patient Name: ODES LOLLI DOB: [birth year] MRN: [id number]   Date of Service  09/10/2016  HPI/Events of Note  Hypotension - BP = 96/63 with MAP = 73 and Hgb = 6.7 this AM  eICU Interventions  Will transfuse 1 unit PRBC now.      Intervention Category Major Interventions: Hypotension - evaluation and management  Jon Lall Eugene 09/10/2016, 5:27 PM

## 2016-09-11 DIAGNOSIS — I2699 Other pulmonary embolism without acute cor pulmonale: Secondary | ICD-10-CM | POA: Diagnosis not present

## 2016-09-11 DIAGNOSIS — J9621 Acute and chronic respiratory failure with hypoxia: Secondary | ICD-10-CM | POA: Diagnosis not present

## 2016-09-11 DIAGNOSIS — I5041 Acute combined systolic (congestive) and diastolic (congestive) heart failure: Secondary | ICD-10-CM | POA: Diagnosis not present

## 2016-09-11 DIAGNOSIS — I2609 Other pulmonary embolism with acute cor pulmonale: Secondary | ICD-10-CM | POA: Diagnosis not present

## 2016-09-11 DIAGNOSIS — N179 Acute kidney failure, unspecified: Secondary | ICD-10-CM | POA: Diagnosis not present

## 2016-09-11 DIAGNOSIS — D6832 Hemorrhagic disorder due to extrinsic circulating anticoagulants: Secondary | ICD-10-CM | POA: Diagnosis not present

## 2016-09-11 DIAGNOSIS — E872 Acidosis: Secondary | ICD-10-CM | POA: Diagnosis not present

## 2016-09-11 DIAGNOSIS — A4102 Sepsis due to Methicillin resistant Staphylococcus aureus: Secondary | ICD-10-CM | POA: Diagnosis not present

## 2016-09-11 LAB — CBC WITH DIFFERENTIAL/PLATELET
BASOS ABS: 0 10*3/uL (ref 0.0–0.1)
BASOS PCT: 0 %
EOS ABS: 0.3 10*3/uL (ref 0.0–0.7)
Eosinophils Relative: 2 %
HCT: 22.5 % — ABNORMAL LOW (ref 39.0–52.0)
Hemoglobin: 7.6 g/dL — ABNORMAL LOW (ref 13.0–17.0)
LYMPHS ABS: 1 10*3/uL (ref 0.7–4.0)
LYMPHS PCT: 6 %
MCH: 30.8 pg (ref 26.0–34.0)
MCHC: 33.8 g/dL (ref 30.0–36.0)
MCV: 91.1 fL (ref 78.0–100.0)
MONO ABS: 2.1 10*3/uL — AB (ref 0.1–1.0)
Monocytes Relative: 13 %
NEUTROS ABS: 13.1 10*3/uL — AB (ref 1.7–7.7)
Neutrophils Relative %: 79 %
PLATELETS: 341 10*3/uL (ref 150–400)
RBC: 2.47 MIL/uL — ABNORMAL LOW (ref 4.22–5.81)
RDW: 19.8 % — AB (ref 11.5–15.5)
WBC: 16.5 10*3/uL — ABNORMAL HIGH (ref 4.0–10.5)

## 2016-09-11 LAB — MAGNESIUM
MAGNESIUM: 2.4 mg/dL (ref 1.7–2.4)
MAGNESIUM: 2.3 mg/dL (ref 1.7–2.4)

## 2016-09-11 LAB — PROCALCITONIN: Procalcitonin: 11.01 ng/mL

## 2016-09-11 LAB — BASIC METABOLIC PANEL
Anion gap: 10 (ref 5–15)
Anion gap: 10 (ref 5–15)
BUN: 34 mg/dL — ABNORMAL HIGH (ref 6–20)
BUN: 28 mg/dL — AB (ref 6–20)
CHLORIDE: 100 mmol/L — AB (ref 101–111)
CO2: 26 mmol/L (ref 22–32)
CO2: 26 mmol/L (ref 22–32)
CREATININE: 3.21 mg/dL — AB (ref 0.61–1.24)
CREATININE: 2.78 mg/dL — AB (ref 0.61–1.24)
Calcium: 8.3 mg/dL — ABNORMAL LOW (ref 8.9–10.3)
Calcium: 8.1 mg/dL — ABNORMAL LOW (ref 8.9–10.3)
Chloride: 100 mmol/L — ABNORMAL LOW (ref 101–111)
GFR calc Af Amer: 32 mL/min — ABNORMAL LOW (ref >60)
GFR calc non Af Amer: 23 mL/min — ABNORMAL LOW (ref >60)
GFR, EST AFRICAN AMERICAN: 27 mL/min — AB (ref >60)
GFR, EST NON AFRICAN AMERICAN: 27 mL/min — AB (ref >60)
GLUCOSE: 102 mg/dL — AB (ref 65–99)
Glucose, Bld: 112 mg/dL — ABNORMAL HIGH (ref 65–99)
Potassium: 4.2 mmol/L (ref 3.5–5.1)
Potassium: 3.5 mmol/L (ref 3.5–5.1)
SODIUM: 136 mmol/L (ref 135–145)
Sodium: 136 mmol/L (ref 135–145)

## 2016-09-11 LAB — PHOSPHORUS: Phosphorus: 6.2 mg/dL — ABNORMAL HIGH (ref 2.5–4.6)

## 2016-09-11 LAB — PROTIME-INR
INR: 5.05
PROTHROMBIN TIME: 48.2 s — AB (ref 11.4–15.2)

## 2016-09-11 MED ORDER — CHLORHEXIDINE GLUCONATE CLOTH 2 % EX PADS
6.0000 | MEDICATED_PAD | Freq: Every day | CUTANEOUS | Status: DC
  Administered 2016-09-11 – 2016-09-15 (×4): 6 via TOPICAL

## 2016-09-11 MED ORDER — HYDROMORPHONE HCL 2 MG PO TABS
1.0000 mg | ORAL_TABLET | ORAL | Status: DC | PRN
  Administered 2016-09-11 – 2016-09-12 (×6): 1 mg via ORAL
  Filled 2016-09-11 (×6): qty 1

## 2016-09-11 MED ORDER — SODIUM CHLORIDE 0.9% FLUSH: 10.0000 mL | INTRAVENOUS | Status: DC | PRN

## 2016-09-11 MED ORDER — ASPIRIN 81 MG PO CHEW
81.0000 mg | CHEWABLE_TABLET | Freq: Every day | ORAL | Status: DC
  Administered 2016-09-11 – 2016-09-15 (×5): 81 mg via ORAL
  Filled 2016-09-11 (×5): qty 1

## 2016-09-11 MED ORDER — SODIUM CHLORIDE 0.9% FLUSH
10.0000 mL | Freq: Two times a day (BID) | INTRAVENOUS | Status: DC
  Administered 2016-09-11 – 2016-09-14 (×7): 10 mL
  Administered 2016-09-15 (×2): 20 mL

## 2016-09-11 NOTE — Progress Notes (Signed)
eLink Physician-Brief Progress Note Patient Name: KIMI KROFT DOB: 09/15/1980 MRN: [id number]   Date of Service  09/11/2016  HPI/Events of Note  Bedside nurse reports several episodes of NSVT in the last hour. Cardiology is following the patient.   eICU Interventions  Will order:  1. BMP and Mg++ level STAT. 2. Defer Rx of NSVT to Cardiology. Bedside nurse will call the cardiologist on call.      Intervention Category Major Interventions: Arrhythmia - evaluation and management  Lawson Mahone Eugene 09/11/2016, 4:45 PM

## 2016-09-11 NOTE — Progress Notes (Signed)
[facility] MD made aware of blood pressures. No new orders.

## 2016-09-11 NOTE — Progress Notes (Signed)
Progress Note  Patient Name: [name] Date of Encounter: 09/11/2016  Primary Cardiologist: [facility]  Subjective   Feeling improved. Breathing better. Minimal SOB  Inpatient Medications    Scheduled Meds: . acetaminophen  650 mg Rectal Once  . aspirin  81 mg Oral Daily  . Chlorhexidine Gluconate Cloth  6 each Topical Daily  . mouth rinse  15 mL Mouth Rinse BID  . sodium chloride flush  10-40 mL Intracatheter Q12H   Continuous Infusions: . sodium chloride    . vancomycin Stopped (09/10/16 2220)   PRN Meds: sodium chloride, hydrALAZINE, HYDROmorphone, oxyCODONE-acetaminophen, sodium chloride flush   Vital Signs    Vitals:   09/11/16 0630 09/11/16 0700 09/11/16 0730 09/11/16 0800  BP: (!) 88/55 (!) 84/59 (!) 85/63 93/69  Pulse: 76 74 71 78  Resp: 12 14 14 19   Temp:    97.7 F (36.5 C)  TempSrc:    Axillary  SpO2: 93% 92% 92% 97%  Weight:      Height:        Intake/Output Summary (Last 24 hours) at 09/11/16 6213 Last data filed at 09/11/16 0600  Gross per 24 hour  Intake              640 ml  Output             1100 ml  Net             -460 ml   Filed Weights   09/12/2016 2230 09/10/16 0232 09/11/16 0300  Weight: 201 lb 3.2 oz (91.3 kg) 201 lb 3.2 oz (91.3 kg) 205 lb 0.4 oz (93 kg)    Telemetry    SR with episodes of SVT - Personally Reviewed  ECG    N/A - Personally Reviewed  Physical Exam   GEN: No acute distress.   Neck: No JVD Cardiac: RRR, no murmurs, rubs, or gallops.  Respiratory: Clear to auscultation bilaterally. GI: Soft, nontender, non-distended  MS: 2+ edema; No deformity. Neuro:  Nonfocal  Psych: Normal affect   Labs    Chemistry Recent Labs Lab 09/08/2016 1717 09/10/16 0528 09/10/16 1709 09/11/16 0315  NA 134* 136 135 136  K 6.1* 6.2* 4.8 4.2  CL 99* 100* 99* 100*  CO2 12* 22 24 26   GLUCOSE 87 105* 95 102*  BUN 31* 36* 38* 34*  CREATININE 3.91* 3.72* 3.51* 3.21*  CALCIUM 8.9 8.4* 8.4* 8.3*  PROT 8.1 7.1  --   --     ALBUMIN 3.4* 3.2*  --   --   AST 69* 54*  --   --   ALT 29 31  --   --   ALKPHOS 134* 105  --   --   BILITOT 4.1* 3.1*  --   --   GFRNONAA 18* 19* 21* 23*  GFRAA 21* 22* 24* 27*  ANIONGAP 23* 14 12 10      Hematology Recent Labs Lab 09/07/2016 1717 09/10/16 0528 09/11/16 0315  WBC 23.7* 21.4* 16.5*  RBC 2.39* 2.11*  2.13* 2.47*  HGB 7.6* 6.7* 7.6*  HCT 23.0* 20.0* 22.5*  MCV 96.2 94.8 91.1  MCH 31.8 31.8 30.8  MCHC 33.0 33.5 33.8  RDW 19.9* 20.2* 19.8*  PLT 423* 358 341    Cardiac EnzymesNo results for input(s): TROPONINI in the last 168 hours. No results for input(s): TROPIPOC in the last 168 hours.   BNP Recent Labs Lab 09/24/2016 2053  BNP >4,500.0*     DDimer No results for  input(s): DDIMER in the last 168 hours.   Radiology    US Renal  Result Date: 09/10/2016 CLINICAL DATA:  Acute renal failure. History of hypertension sickle cell anemia. EXAM: RENAL / URINARY TRACT ULTRASOUND COMPLETE COMPARISON:  CT abdomen and pelvis July 26, 2016 FINDINGS: Right Kidney: Length: 13.6 cm. Echogenicity within normal limits. No mass or hydronephrosis visualized. Multifocal scarring. Left Kidney: Length: 12.6 cm. Echogenicity within normal limits. No mass or hydronephrosis visualized. Multifocal scarring. Bladder: Appears normal for degree of bladder distention. IMPRESSION: 1. No obstructive uropathy. 2. Cortical scarring, better demonstrated on prior CT. Electronically Signed   By: Elon Alas M.D.   On: 09/10/2016 01:09   Dg Chest Port 1 View  Result Date: 09/10/2016 CLINICAL DATA:  Acute respiratory failure. Acute respiratory failure. EXAM: PORTABLE CHEST 1 VIEW COMPARISON:  09/22/2016 FINDINGS: The heart is enlarged but stable. Prominent mediastinal and hilar contours again demonstrated. Improved lung aeration in part due to better lung volumes. Resolving perihilar pulmonary edema. Persistent areas of atelectasis and small effusions. No pneumothorax. IMPRESSION: Stable  cardiac enlargement and prominent pulmonary arteries. Improved lung aeration with resolving pulmonary edema and atelectasis. Electronically Signed   By: Marijo Sanes M.D.   On: 09/10/2016 08:11   Dg Chest Portable 1 View  Result Date: 09/25/2016 CLINICAL DATA:  PICC line placement. Respiratory distress. Ex-smoker. EXAM: PORTABLE CHEST 1 VIEW COMPARISON:  08/05/2016. FINDINGS: Left PICC tip at the junction of the superior vena cava and right atrium. Interval median sternotomy wires and prosthetic heart valve. Stable enlarged cardiac silhouette. Mild increase in prominence of the interstitial markings with an interval small right pleural effusion. Decreased inspiration with mild right basilar patchy opacity in minimal left basilar linear density. Unremarkable bones. IMPRESSION: 1. Left PICC tip in satisfactory position. 2. Interval mild changes of congestive heart failure. 3. Poor inspiration with mild right basilar atelectasis and minimal left basilar atelectasis. 4. Stable cardiomegaly. Electronically Signed   By: Claudie Revering M.D.   On: 09/11/2016 17:43    Cardiac Studies   TTE - Left ventricle: The cavity size was normal. Wall thickness was   increased in a pattern of moderate LVH. Systolic function was   mildly reduced. The estimated ejection fraction was in the range   of 45% to 50%. Diffuse hypokinesis. The study is not technically   sufficient to allow evaluation of LV diastolic function. - Ventricular septum: The contour showed diastolic flattening and   systolic flattening. - Mitral valve: Mechanical prosthesis. Normal tilting bileaflet   motion. No obstruction. Trivial MR. - Left atrium: The atrium was normal in size. - Right ventricle: Massively dilated with moderate RV systolic   dysfunction. - Right atrium: Severely dilated, - Tricuspid valve: Annuloplasty ring in place. Trivial TR. No   vegetation. - Pulmonic valve: There was mild regurgitation. - Pulmonary arteries: PA peak  pressure: 41 mm Hg (S). - Inferior vena cava: The vessel was dilated. The respirophasic   diameter changes were blunted (< 50%), consistent with elevated   central venous pressure.  Patient Profile     36 y.o. 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 No further changes. Goal INR 2.5-3.5. If coumadin stopped Laurie Penado need to bridge with heparin. 2. Mild  systolic heart failure: Likely nonischemic. Volume overload with LE edema and increased BMP. BP low overnight. Would diurese as BP allows. Would start lasix 80 mg IV when BP rebounds. 3. Acute renal failure: Creatinine significantly elevated from baseline. Improving with diuresis.  4. Pulmonary hypertension: Likely the cause of his RV systolic dysfunction. significant LE edema. Continue diuresis 5. SVT: seen on monitor. Possibly due to volume overload. May improve as patient is diuresed. 6. Sepsis: antibiotics per primary team.  Signed, Wyona Neils Meredith Leeds, MD  09/11/2016, 9:22 AM

## 2016-09-11 NOTE — Progress Notes (Addendum)
[place] for warfarin Indication: pulmonary embolus hx, mechanical MVR   Assessment: 41 yom with hx of PE and mechanical MVR on warfarin PTA, currently on hold due to supratherapeutic INR. INR 7.73 on admit, down to 5.05. Pharmacy consulted to resume warfarin when INR<3. Hg up 7.6 s/p 1u PRBC, plt wnl. No bleed documented. SCDs on.  PTA warfarin dose: 4mg  on MWF and 5mg  AOD - per mother (last dose 8/10 PTA)  Goal of Therapy:  INR 2.5-3.5 Monitor platelets by anticoagulation protocol: Yes   Plan:  Resume warfarin when INR<3 per consult Daily INR Monitor CBC, s/sx bleeding  Elicia Lamp, PharmD, BCPS Clinical Pharmacist 09/11/2016 10:06 AM

## 2016-09-11 NOTE — Progress Notes (Signed)
PULMONARY / CRITICAL CARE MEDICINE   Name: [name] MRN: [id number] DOB: [birth year]    ADMISSION DATE:  09/11/2016 CONSULTATION DATE:  09/18/2016  REFERRING MD:  Arnetha Massy, M.D. / ED Resident  CHIEF COMPLAINT:  Lactic Acidosis  HISTORY OF PRESENT ILLNESS:   36 y.o. male with known history of sickle cell anemia and pulmonary hypertension with prior h/o  pulmonary embolism. Was found to have a MRSA bacteremia in July with blood cultures done at [facility]. Patient was evaluated by ID that recommended removal of port. He was ultimately found to have a large vegetation measuring at 2.6 cm in maximal dimension on transesophageal echocardiogram on his mitral valve with a possible PFO. Patient is followed by post hematology and pulmonology at [facility] and therefore transferred to [facility] mechanical mitral valve replacement as well as tricuspid valve ring on 7/17   Adm 8/10 with resp distress with severe  Lactic acidosis & coagulopathy  SUBJECTIVE:  Transitioned off NRB to [place] C/o pain in ribs & asking for pain meds afebrile  VITAL SIGNS: BP 93/69   Pulse 78   Temp (!) 97.4 F (36.3 C) (Axillary)   Resp 19   Ht 6\' 1"  (1.854 m)   Wt 205 lb 0.4 oz (93 kg)   SpO2 97%   BMI 27.05 kg/m   HEMODYNAMICS:    VENTILATOR SETTINGS:    INTAKE / OUTPUT: I/O last 3 completed shifts: In: 1659.2 [P.O.:290; I.V.:519.2; IV Piggyback:850] Out: 2675 [Urine:2675]  PHYSICAL EXAMINATION: General:  Awake. Alert. No distress. On [place] Integument:  Warm & dry. No rash on exposed skin. Multiple tattoos noted. Extremities:  No cyanosis or clubbing.  Lymphatics:  No appreciated cervical or supraclavicular lymphadenoapthy. HEENT: No scleral injection or icterus. Pupils symmetric. Cardiovascular:  Regular rhythm. no  JVD.  Pulmonary:  Slightly diminished breath sounds in the bases no rhonchi Abdomen: Soft. Normal bowel sounds. Mildly  protuberant. Grossly nontender. Musculoskeletal:  Normal bulk and tone.  Pitting lower extremity edema to the knees. Neurological: Hand grip strength 5/5 bilaterally.Moving all 4 extremities equally. Drowsy, but interactive   LABS:  BMET  Recent Labs Lab 09/10/16 0528 09/10/16 1709 09/11/16 0315  NA 136 135 136  K 6.2* 4.8 4.2  CL 100* 99* 100*  CO2 22 24 26   BUN 36* 38* 34*  CREATININE 3.72* 3.51* 3.21*  GLUCOSE 105* 95 102*    Electrolytes  Recent Labs Lab 09/13/2016 2053 09/10/16 0528 09/10/16 1709 09/11/16 0315  CALCIUM  --  8.4* 8.4* 8.3*  MG 2.5* 2.5*  --  2.4  PHOS  --  6.6*  --  6.2*    CBC  Recent Labs Lab 09/08/2016 1717 09/10/16 0528 09/11/16 0315  WBC 23.7* 21.4* 16.5*  HGB 7.6* 6.7* 7.6*  HCT 23.0* 20.0* 22.5*  PLT 423* 358 341    Coag's  Recent Labs Lab 08/31/2016 1717 09/10/16 0528 09/11/16 0315  INR 7.73* 5.83* 5.05*    Sepsis Markers  Recent Labs Lab 09/11/2016 2048 09/06/2016 2053 09/10/16 0528 09/10/16 0529 09/11/16 0315  LATICACIDVEN 6.17* 5.3*  --  1.1  --   PROCALCITON  --  4.81 12.30  --  11.01    ABG  Recent Labs Lab 09/04/2016 1730  PHART 7.211*  PCO2ART 30.2*  PO2ART 125.0*    Liver Enzymes  Recent Labs Lab 09/18/2016 1717 09/10/16 0528  AST 69* 54*  ALT 29 31  ALKPHOS 134* 105  BILITOT 4.1* 3.1*  ALBUMIN 3.4* 3.2*    Cardiac Enzymes No results for input(s): TROPONINI, PROBNP in the last 168 hours.  Glucose No results for input(s): GLUCAP in the last 168 hours.  Imaging No results found. 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  P:  cardiology consultation  ? Need for amio - home med  RENAL A:   Lactic Acidosis: resolved AKI Hyperkalemia -improved with Kayexalate x1  P:   ct Lasix 40 q 12h Hold aldactone   GASTROINTESTINAL A:   No acute issues.  P:    advance to renal diet   HEMATOLOGIC A:   Sickle Cell Anemia: Patient with baseline anemia. Coagulopathy: Likely secondary to systemic anticoagulation with Coumadin.  P:  holding Coumadin, INR decreasing, resume when down to 3.0 range High retic count, doubt crisis, hold hydrea for now Goal Hb 7 & above  INFECTIOUS A:   Possible Sepsis H/O MRSE Bacteremia & Endocarditis:  On Daptomycin to 8/27 from [place].  P:   Await cx data Ct   Vancomycin , dc zosyn since no other source found redose dapto when needed    NEUROLOGIC A:   Chronic pain  P:   Doubt active crisis Hold  MS contin (with renal failure) until he is more awake Resume dilaudid (1/2 home dose ) & monitor mental status   FAMILY  - Updates: Patient   DISCUSSION:  36 y.o. male with known history of MRSE bacteremia and endocarditis status post mechanical mitral valve replacement and tricuspid ring. Patient chronically on systemic anticoagulation as well as antibiotic therapy. Clinical picture could be consistent with  acute cor pulmonale versus acute congestive heart failure from decompensation of left ventricle or valve replacement.  Seems to have improved, expect renal failure to resolve slowly, concerned about  Resuming home pain meds with renal failure - watch in SDU  My cc time x 18m  Kara Mead MD. FCCP. [place] [facility] Pager [phone] If no response call [phone]    09/11/2016, 9:00 AM

## 2016-09-11 NOTE — Plan of Care (Signed)
Problem: Activity: Goal: Risk for activity intolerance will decrease Outcome: Progressing Pt walked in room and sat up in chair for several hours today.

## 2016-09-12 DIAGNOSIS — I330 Acute and subacute infective endocarditis: Secondary | ICD-10-CM | POA: Diagnosis not present

## 2016-09-12 DIAGNOSIS — A419 Sepsis, unspecified organism: Secondary | ICD-10-CM | POA: Diagnosis not present

## 2016-09-12 DIAGNOSIS — D6832 Hemorrhagic disorder due to extrinsic circulating anticoagulants: Secondary | ICD-10-CM | POA: Diagnosis not present

## 2016-09-12 DIAGNOSIS — I5041 Acute combined systolic (congestive) and diastolic (congestive) heart failure: Secondary | ICD-10-CM | POA: Diagnosis not present

## 2016-09-12 DIAGNOSIS — I2699 Other pulmonary embolism without acute cor pulmonale: Secondary | ICD-10-CM | POA: Diagnosis not present

## 2016-09-12 DIAGNOSIS — R718 Other abnormality of red blood cells: Secondary | ICD-10-CM | POA: Diagnosis not present

## 2016-09-12 DIAGNOSIS — I471 Supraventricular tachycardia: Secondary | ICD-10-CM | POA: Diagnosis not present

## 2016-09-12 DIAGNOSIS — J9620 Acute and chronic respiratory failure, unspecified whether with hypoxia or hypercapnia: Secondary | ICD-10-CM

## 2016-09-12 DIAGNOSIS — E876 Hypokalemia: Secondary | ICD-10-CM | POA: Diagnosis not present

## 2016-09-12 DIAGNOSIS — I509 Heart failure, unspecified: Secondary | ICD-10-CM

## 2016-09-12 DIAGNOSIS — D649 Anemia, unspecified: Secondary | ICD-10-CM | POA: Diagnosis not present

## 2016-09-12 DIAGNOSIS — N179 Acute kidney failure, unspecified: Secondary | ICD-10-CM | POA: Diagnosis not present

## 2016-09-12 DIAGNOSIS — J9621 Acute and chronic respiratory failure with hypoxia: Secondary | ICD-10-CM | POA: Diagnosis not present

## 2016-09-12 DIAGNOSIS — Z952 Presence of prosthetic heart valve: Secondary | ICD-10-CM | POA: Diagnosis not present

## 2016-09-12 DIAGNOSIS — R9431 Abnormal electrocardiogram [ECG] [EKG]: Secondary | ICD-10-CM

## 2016-09-12 DIAGNOSIS — A4102 Sepsis due to Methicillin resistant Staphylococcus aureus: Secondary | ICD-10-CM | POA: Diagnosis not present

## 2016-09-12 DIAGNOSIS — I428 Other cardiomyopathies: Secondary | ICD-10-CM | POA: Diagnosis not present

## 2016-09-12 DIAGNOSIS — I5033 Acute on chronic diastolic (congestive) heart failure: Secondary | ICD-10-CM

## 2016-09-12 DIAGNOSIS — I110 Hypertensive heart disease with heart failure: Secondary | ICD-10-CM | POA: Diagnosis not present

## 2016-09-12 DIAGNOSIS — E872 Acidosis: Secondary | ICD-10-CM | POA: Diagnosis not present

## 2016-09-12 DIAGNOSIS — I272 Pulmonary hypertension, unspecified: Secondary | ICD-10-CM | POA: Diagnosis not present

## 2016-09-12 DIAGNOSIS — R0902 Hypoxemia: Secondary | ICD-10-CM | POA: Diagnosis not present

## 2016-09-12 LAB — CBC WITH DIFFERENTIAL/PLATELET
BASOS PCT: 0 %
Basophils Absolute: 0 10*3/uL (ref 0.0–0.1)
EOS ABS: 0.4 10*3/uL (ref 0.0–0.7)
Eosinophils Relative: 3 %
HCT: 24.4 % — ABNORMAL LOW (ref 39.0–52.0)
HEMOGLOBIN: 8.1 g/dL — AB (ref 13.0–17.0)
Lymphocytes Relative: 7 %
Lymphs Abs: 0.9 10*3/uL (ref 0.7–4.0)
MCH: 31 pg (ref 26.0–34.0)
MCHC: 33.2 g/dL (ref 30.0–36.0)
MCV: 93.5 fL (ref 78.0–100.0)
Monocytes Absolute: 1.7 10*3/uL — ABNORMAL HIGH (ref 0.1–1.0)
Monocytes Relative: 13 %
NEUTROS ABS: 10.4 10*3/uL — AB (ref 1.7–7.7)
Neutrophils Relative %: 77 %
PLATELETS: 334 10*3/uL (ref 150–400)
RBC: 2.61 MIL/uL — ABNORMAL LOW (ref 4.22–5.81)
RDW: 20.8 % — AB (ref 11.5–15.5)
WBC: 13.4 10*3/uL — ABNORMAL HIGH (ref 4.0–10.5)

## 2016-09-12 LAB — BASIC METABOLIC PANEL
ANION GAP: 8 (ref 5–15)
Anion gap: 6 (ref 5–15)
BUN: 22 mg/dL — AB (ref 6–20)
BUN: 18 mg/dL (ref 6–20)
CHLORIDE: 100 mmol/L — AB (ref 101–111)
CO2: 27 mmol/L (ref 22–32)
CO2: 25 mmol/L (ref 22–32)
Calcium: 8.2 mg/dL — ABNORMAL LOW (ref 8.9–10.3)
Calcium: 7.8 mg/dL — ABNORMAL LOW (ref 8.9–10.3)
Chloride: 105 mmol/L (ref 101–111)
Creatinine, Ser: 2.23 mg/dL — ABNORMAL HIGH (ref 0.61–1.24)
Creatinine, Ser: 1.87 mg/dL — ABNORMAL HIGH (ref 0.61–1.24)
GFR calc Af Amer: 42 mL/min — ABNORMAL LOW (ref >60)
GFR calc Af Amer: 51 mL/min — ABNORMAL LOW (ref >60)
GFR calc non Af Amer: 44 mL/min — ABNORMAL LOW (ref >60)
GFR, EST NON AFRICAN AMERICAN: 36 mL/min — AB (ref >60)
GLUCOSE: 107 mg/dL — AB (ref 65–99)
Glucose, Bld: 108 mg/dL — ABNORMAL HIGH (ref 65–99)
POTASSIUM: 3.4 mmol/L — AB (ref 3.5–5.1)
Potassium: 3.5 mmol/L (ref 3.5–5.1)
Sodium: 135 mmol/L (ref 135–145)
Sodium: 136 mmol/L (ref 135–145)

## 2016-09-12 LAB — PROTIME-INR
INR: 4.83
Prothrombin Time: 46.5 seconds — ABNORMAL HIGH (ref 11.4–15.2)

## 2016-09-12 LAB — PHOSPHORUS: Phosphorus: 4.4 mg/dL (ref 2.5–4.6)

## 2016-09-12 LAB — VANCOMYCIN, TROUGH: Vancomycin Tr: 18 ug/mL (ref 15–20)

## 2016-09-12 LAB — MAGNESIUM: MAGNESIUM: 2.2 mg/dL (ref 1.7–2.4)

## 2016-09-12 MED ORDER — GABAPENTIN 100 MG PO CAPS
100.0000 mg | ORAL_CAPSULE | Freq: Three times a day (TID) | ORAL | Status: DC
  Administered 2016-09-12 – 2016-09-15 (×10): 100 mg via ORAL
  Filled 2016-09-12 (×10): qty 1

## 2016-09-12 MED ORDER — POTASSIUM CHLORIDE CRYS ER 20 MEQ PO TBCR
40.0000 meq | EXTENDED_RELEASE_TABLET | Freq: Once | ORAL | Status: AC
  Administered 2016-09-12: 40 meq via ORAL
  Filled 2016-09-12: qty 2

## 2016-09-12 MED ORDER — FOLIC ACID 1 MG PO TABS
2.0000 mg | ORAL_TABLET | Freq: Every day | ORAL | Status: DC
  Administered 2016-09-13 – 2016-09-15 (×3): 2 mg via ORAL
  Filled 2016-09-12 (×3): qty 2

## 2016-09-12 MED ORDER — GABAPENTIN 300 MG PO CAPS: 300.0000 mg | ORAL_CAPSULE | Freq: Three times a day (TID) | ORAL | Status: DC

## 2016-09-12 MED ORDER — FUROSEMIDE 10 MG/ML IJ SOLN
20.0000 mg | Freq: Two times a day (BID) | INTRAMUSCULAR | Status: DC
  Administered 2016-09-12 – 2016-09-15 (×8): 20 mg via INTRAVENOUS
  Filled 2016-09-12 (×8): qty 2

## 2016-09-12 MED ORDER — HYDROXYUREA 500 MG PO CAPS
1000.0000 mg | ORAL_CAPSULE | ORAL | Status: DC
  Administered 2016-09-12 – 2016-09-14 (×2): 1000 mg via ORAL
  Filled 2016-09-12 (×3): qty 2

## 2016-09-12 MED ORDER — HYDROMORPHONE HCL 2 MG PO TABS
1.0000 mg | ORAL_TABLET | ORAL | Status: DC | PRN
  Administered 2016-09-12 – 2016-09-13 (×4): 2 mg via ORAL
  Filled 2016-09-12 (×4): qty 1

## 2016-09-12 MED ORDER — ACETAMINOPHEN 325 MG PO TABS
650.0000 mg | ORAL_TABLET | Freq: Four times a day (QID) | ORAL | Status: DC | PRN
Start: 2016-09-12 — End: 2016-09-16

## 2016-09-12 MED ORDER — OXYCODONE HCL 5 MG PO TABS
5.0000 mg | ORAL_TABLET | ORAL | Status: DC | PRN
  Administered 2016-09-12 – 2016-09-15 (×7): 10 mg via ORAL
  Filled 2016-09-12 (×7): qty 2

## 2016-09-12 NOTE — Progress Notes (Signed)
[place] for warfarin Indication: pulmonary embolus hx, mechanical MVR   Assessment: 43 yom with hx of PE and mechanical MVR on warfarin PTA, currently on hold due to supratherapeutic INR. INR 7.73 on admit, down to 4.83. Pharmacy consulted to resume warfarin when INR<3. Hg up 8.1 s/p 1u PRBC, plt wnl. No bleed documented. SCDs on.  PTA warfarin dose: 4mg  on MWF and 5mg  AOD - per mother (last dose 8/10 PTA)  Goal of Therapy:  INR 2.5-3.5 Monitor platelets by anticoagulation protocol: Yes   Plan:  Resume warfarin when INR<3 per consult Daily INR Monitor CBC, s/sx bleeding  Georga Bora, PharmD Clinical Pharmacist 09/12/2016 7:57 AM

## 2016-09-12 NOTE — Progress Notes (Signed)
eLink Physician-Brief Progress Note Patient Name: CHRISTEN BEDOYA DOB: 07-Nov-1980 MRN: [id number]   Date of Service  09/12/2016  HPI/Events of Note    eICU Interventions  Hypokalemia -repleted      Intervention Category Intermediate Interventions: Electrolyte abnormality - evaluation and management  ALVA,RAKESH V. 09/12/2016, 5:25 AM

## 2016-09-12 NOTE — Evaluation (Signed)
Physical Therapy Evaluation Patient Details Name: [name] MRN: [id number] DOB: [birth year] Today's Date: 09/12/2016   History of Present Illness  Pt is a 36 y/o male who presents s/p sickle cell crisis. 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 Pt presents to the ED with sudden SOB and pitting edema in bilateral LE's.   Clinical Impression  Pt admitted with above diagnosis. Pt currently with functional limitations due to the deficits listed below (see PT Problem List). At the time of PT eval pt was able to perform transfers and minimal ambulation with close guard for safety as well as +2 due to multiple lines. Pt recently d/c home from [facility] after a 3 week stay. Overall pt deconditioned and noted increase in HR to 152 bpm with sit<>stand transfer. Will plan for outpatient PT follow up however if pt does not progress with tolerance for functional mobility, may want to consider HHPT. Pt will benefit from skilled PT to increase their independence and safety with mobility to allow discharge to the venue listed below.       Follow Up Recommendations Outpatient PT;Supervision for mobility/OOB    Equipment Recommendations  Other (comment) (Depends on progress with PT. May need an AD)    Recommendations for Other Services       Precautions / Restrictions Precautions Precautions: Fall Restrictions Weight Bearing Restrictions: No      Mobility  Bed Mobility Overal bed mobility: Needs Assistance Bed Mobility: Supine to Sit     Supine to sit: Supervision     General bed mobility comments: HOB elevated. Pt was able to transition to EOB with supervision for safety and increased time.   Transfers Overall transfer level: Needs assistance Equipment used: None Transfers: Sit to/from Stand Sit to  Stand: Min guard         General transfer comment: Hands-on guarding required for balance support and safety. Assist mainly for management of lines.  Ambulation/Gait Ambulation/Gait assistance: Min guard Ambulation Distance (Feet): 3 Feet Assistive device: None Gait Pattern/deviations: Step-through pattern;Decreased stride length;Trunk flexed Gait velocity: Decreased Gait velocity interpretation: Below normal speed for age/gender General Gait Details: Pt was able to take a few steps from bed to chair. Hands-on guarding provided for safety and assist provided for management of lines. Pt initially refusing to ambulate, however once standing was agreeable. Due to increased HR (152bpm) with standing, further ambulation deferred. RN present and agreed.   Stairs            Wheelchair Mobility    Modified Rankin (Stroke Patients Only)       Balance Overall balance assessment: Needs assistance Sitting-balance support: Feet supported;No upper extremity supported Sitting balance-Leahy Scale: Good     Standing balance support: No upper extremity supported;During functional activity Standing balance-Leahy Scale: Fair                               Pertinent Vitals/Pain Pain Assessment: 0-10 Pain Score: 8  Pain Location: Chest, L side Pain Descriptors / Indicators: Sharp;Constant Pain Intervention(s): RN gave pain meds during session    [place] expects to be discharged to:: Private residence Living Arrangements: Parent;Other relatives Available Help at Discharge: Family;Available 24 hours/day Type of Home: Apartment Home Access:  Level entry     Home Layout: One level Home Equipment: None      Prior Function Level of Independence: Independent         Comments: Pt was at [facility] for about 3 weeks. Working with therapy - by the end of his stay was not using an AD and was able to give himself a shower.      Hand Dominance   Dominant  Hand: Right    Extremity/Trunk Assessment   Upper Extremity Assessment Upper Extremity Assessment: Defer to OT evaluation    Lower Extremity Assessment Lower Extremity Assessment: RLE deficits/detail RLE Deficits / Details: Decreased hip AROM 2 AVN/hip replacement in 2008    Cervical / Trunk Assessment Cervical / Trunk Assessment: Normal  Communication   Communication: Expressive difficulties  Cognition Arousal/Alertness: Awake/alert Behavior During Therapy: Flat affect Overall Cognitive Status: Within Functional Limits for tasks assessed                                        General Comments      Exercises     Assessment/Plan    PT Assessment Patient needs continued PT services  PT Problem List Decreased strength;Decreased range of motion;Decreased activity tolerance;Decreased balance;Decreased mobility;Decreased knowledge of use of DME;Decreased safety awareness;Decreased knowledge of precautions;Pain       PT Treatment Interventions DME instruction;Gait training;Stair training;Functional mobility training;Therapeutic activities;Therapeutic exercise;Neuromuscular re-education;Patient/family education    PT Goals (Current goals can be found in the Care Plan section)  Acute Rehab PT Goals Patient Stated Goal: Return home PT Goal Formulation: With patient Time For Goal Achievement: 09/19/16 Potential to Achieve Goals: Good    Frequency Min 3X/week   Barriers to discharge        Co-evaluation               AM-PAC PT "6 Clicks" Daily Activity  Outcome Measure Difficulty turning over in bed (including adjusting bedclothes, sheets and blankets)?: None Difficulty moving from lying on back to sitting on the side of the bed? : A Little Difficulty sitting down on and standing up from a chair with arms (e.g., wheelchair, bedside commode, etc,.)?: A Little Help needed moving to and from a bed to chair (including a wheelchair)?: A Little Help  needed walking in hospital room?: A Little Help needed climbing 3-5 steps with a railing? : A Lot 6 Click Score: 18    End of Session Equipment Utilized During Treatment: Gait belt;Oxygen Activity Tolerance: Treatment limited secondary to medical complications (Comment) (tachycardia) Patient left: in chair;with call bell/phone within reach;with chair alarm set;with nursing/sitter in room Nurse Communication: Mobility status PT Visit Diagnosis: Unsteadiness on feet (R26.81);Pain;Difficulty in walking, not elsewhere classified (R26.2) Pain - part of body:  (chest)    Time: 3267-1245 PT Time Calculation (min) (ACUTE ONLY): 21 min   Charges:   PT Evaluation $PT Eval Moderate Complexity: 1 Mod     PT G Codes:        Rolinda Roan, PT, DPT Acute Rehabilitation Services Pager: [phone]   Thelma Comp 09/12/2016, 1:44 PM

## 2016-09-12 NOTE — Progress Notes (Signed)
Pharmacy Antibiotic Note  [name] is a 35 y.o. male admitted on 09/10/2016 with sepsis. Hx of endocarditis, recently d/c'd from [place]. Pharmacy has been consulted for vancomycin/zosyn dosing.   Scr decreased further this evening to 1.89 from 2.2 this morning. A vancomycin trough tonight is in range at 53mcg/mL.  Plan: Zosyn 3.375g IV q8h (4h infusion) Vancomycin 1g IV q24h Monitor clinical progress, c/s, renal function and repeat levels F/u de-escalation plan/LOT   Height: 6\' 1"  (185.4 cm) Weight: 201 lb 15.1 oz (91.6 kg) IBW/kg (Calculated) : 79.9  Temp (24hrs), Avg:97.7 F (36.5 C), Min:97.5 F (36.4 C), Max:98.1 F (36.7 C)   Recent Labs Lab 09/18/2016 1717 09/22/2016 1728 09/28/2016 2048 09/14/2016 2053 09/10/16 0528 09/10/16 0529 09/10/16 1709 09/11/16 0315 09/11/16 1748 09/12/16 0440 09/12/16 1200 09/12/16 1750  WBC 23.7*  --   --   --  21.4*  --   --  16.5*  --  13.4*  --   --   CREATININE 3.91*  --   --   --  3.72*  --  3.51* 3.21* 2.78* 2.23* 1.87*  --   LATICACIDVEN  --  12.82* 6.17* 5.3*  --  1.1  --   --   --   --   --   --   VANCOTROUGH  --   --   --   --   --   --   --   --   --   --   --  18    Estimated Creatinine Clearance: 61.1 mL/min (A) (by C-G formula based on SCr of 1.87 mg/dL (H)).    No Known Allergies  Antimicrobials this admission: 8/10 vancomycin >>  8/10 zosyn >>   Dose adjustments this admission: 8/13 VT = 17mcg/mL on 1g IV q24h- continued  Microbiology results: 8/10 BCx: ngtd  [name] D. Andrae Claunch, PharmD, [place] Clinical Pharmacist Pager: [phone] 09/12/2016 7:01 PM

## 2016-09-12 NOTE — Progress Notes (Signed)
Pharmacy Antibiotic Note  [name] is a 36 y.o. male admitted on 09/04/2016 with sepsis. Hx of endocarditis, recently d/c'd from [place]. Pharmacy has been consulted for vancomycin/zosyn dosing. Tmax 102.5>afebrile, WBC down 23.7>13.4, LA 12.82. AKI - SCr 2.23 on admit (baseline ~0.8-0.9), CrCl~51.  Plan: Zosyn 3.375g IV (110min infusion) x1; then 3.375g IV q8h (4h infusion) Vancomycin 2g IV x1; then 1g IV q24h Monitor clinical progress, c/s, renal function F/u de-escalation plan/LOT Vancomycin trough 8/13   Height: 6\' 1"  (185.4 cm) Weight: 201 lb 15.1 oz (91.6 kg) IBW/kg (Calculated) : 79.9  Temp (24hrs), Avg:97.7 F (36.5 C), Min:97.5 F (36.4 C), Max:98 F (36.7 C)   Recent Labs Lab 09/28/2016 1717 09/20/2016 1728 09/06/2016 2048 09/27/2016 2053 09/10/16 0528 09/10/16 0529 09/10/16 1709 09/11/16 0315 09/11/16 1748 09/12/16 0440  WBC 23.7*  --   --   --  21.4*  --   --  16.5*  --  13.4*  CREATININE 3.91*  --   --   --  3.72*  --  3.51* 3.21* 2.78* 2.23*  LATICACIDVEN  --  12.82* 6.17* 5.3*  --  1.1  --   --   --   --     Estimated Creatinine Clearance: 51.3 mL/min (A) (by C-G formula based on SCr of 2.23 mg/dL (H)).    No Known Allergies  Antimicrobials this admission: 8/10 vancomycin >>  8/10 zosyn >>   Dose adjustments this admission:   Microbiology results: 8/10 BCx:  Georga Bora, PharmD Clinical Pharmacist 09/12/2016 8:04 AM

## 2016-09-12 NOTE — Progress Notes (Signed)
[place] TEAM 1 - Stepdown/ICU TEAM  [name]  IRJ:[id number] DOB: 1980/12/25 DOA: 09/22/2016 PCP: Tresa Garter, MD    Brief Narrative:  36yo male with a history of sickle cell anemia and pulmonary hypertension with prior pulmonary emboli who was found to have a MRSA bacteremia in July 2018.  He was ultimately found to have a 2.6 cm mitral valve vegetation on TEE with a possible PFO.  He underwent mechanical mitral valve replacement as well as tricuspid valve ring on 7/17 at [facility].  He presented to [facility] on 8/10 with resp distress with severe lactic acidosis & coagulopathy.    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  He denies n/v or abdom pain.  He is eating his supper w/o difficulty as I examine him.    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 at [facility] July 2018 Valves functioning properly per TTE per Cards service   Coumadin induced coagulopathy Goal INR is 2.5-3.5  Mild nonishcemic systolic CHF EF 30-16% - some peripheral edema persists, but diuresis limited by hypotension - follow Filed Weights   09/10/16 0232 09/11/16 0300 09/12/16 0430  Weight: 91.3 kg (201 lb 3.2 oz) 93 kg (205 lb 0.4 oz) 91.6 kg (201 lb 15.1 oz)    Lactic acidosis Resolved   Acute kidney injury  crt is slowly improving - follow trend   Sickle Cell Anemia   Chronic pain  On chronic high  dose narcotics - dosing limited by hypotension at present   DVT prophylaxis: warfarin  Code Status: FULL CODE Family Communication: no family present at time of exam  Disposition Plan: SDU   Consultants:  PCCM Cardiology  Antimicrobials:  Zosyn 8/10 > 8/11 Vanc 8/10 >  Objective: Blood pressure 132/86, pulse 83, temperature 97.6 F (36.4 C), resp. rate 18, height 6\' 1"  (1.854 m), weight 91.6 kg (201 lb 15.1 oz), SpO2 96 %.  Intake/Output Summary (Last 24 hours) at 09/12/16 1745 Last data filed at 09/12/16 1658  Gross per 24 hour  Intake              810 ml  Output             1850 ml  Net            -1040 ml   Filed Weights   09/10/16 0232 09/11/16 0300 09/12/16 0430  Weight: 91.3 kg (201 lb 3.2 oz) 93 kg (205 lb 0.4 oz) 91.6 kg (201 lb 15.1 oz)    Examination: General: No acute respiratory distress Lungs: Clear to auscultation bilaterally without wheezes or crackles Cardiovascular: Regular rate and rhythm without murmur gallop or rub normal S1 and S2 Abdomen: Nontender, nondistended, soft, bowel sounds positive, no rebound, no ascites, no appreciable mass Extremities: 1+ B LE edema   CBC:  Recent Labs Lab 09/22/2016 1717 09/10/16 0528 09/11/16 0315 09/12/16 0440  WBC 23.7*  21.4* 16.5* 13.4*  NEUTROABS 19.2* 17.1* 13.1* 10.4*  HGB 7.6* 6.7* 7.6* 8.1*  HCT 23.0* 20.0* 22.5* 24.4*  MCV 96.2 94.8 91.1 93.5  PLT 423* 358 341 826   Basic Metabolic Panel:  Recent Labs Lab 09/15/2016 2053 09/10/16 0528 09/10/16 1709 09/11/16 0315 09/11/16 1748 09/12/16 0440 09/12/16 1200  NA  --  136 135 136 136 135 136  K  --  6.2* 4.8 4.2 3.5 3.4* 3.5  CL  --  100* 99* 100* 100* 100* 105  CO2  --  22 24 26 26 27 25   GLUCOSE  --  105* 95 102* 112* 107* 108*  BUN  --  36* 38* 34* 28* 22* 18  CREATININE  --  3.72* 3.51* 3.21* 2.78* 2.23* 1.87*  CALCIUM  --  8.4* 8.4* 8.3* 8.1* 8.2* 7.8*  MG 2.5* 2.5*  --  2.4 2.3 2.2  --   PHOS  --  6.6*  --  6.2*  --  4.4  --     GFR: Estimated Creatinine Clearance: 61.1 mL/min (A) (by C-G formula based on SCr of 1.87 mg/dL (H)).  Liver Function Tests:  Recent Labs Lab 09/15/2016 1717 09/10/16 0528  AST 69* 54*  ALT 29 31  ALKPHOS 134* 105  BILITOT 4.1* 3.1*  PROT 8.1 7.1  ALBUMIN 3.4* 3.2*   Coagulation Profile:  Recent Labs Lab 09/26/2016 1717 09/10/16 0528 09/11/16 0315 09/12/16 0440  INR 7.73* 5.83* 5.05* 4.83*    Recent Results (from the past 240 hour(s))  Culture, blood (Routine x 2)     Status: None (Preliminary result)   Collection Time: 09/23/2016  5:30 PM  Result Value Ref Range Status   Specimen Description BLOOD LEFT ARM  Final   Special Requests   Final    BOTTLES DRAWN AEROBIC AND ANAEROBIC Blood Culture adequate volume   Culture NO GROWTH 2 DAYS  Final   Report Status PENDING  Incomplete  Culture, blood (Routine x 2)     Status: None (Preliminary result)   Collection Time: 09/28/2016  8:40 PM  Result Value Ref Range Status   Specimen Description BLOOD RIGHT HAND  Final   Special Requests IN PEDIATRIC BOTTLE Blood Culture adequate volume  Final   Culture NO GROWTH 1 DAY  Final   Report Status PENDING  Incomplete     Scheduled Meds: . acetaminophen  650 mg Rectal Once  . aspirin  81 mg Oral Daily  . Chlorhexidine Gluconate Cloth  6 each Topical Daily  . furosemide  20 mg Intravenous BID  . mouth rinse  15 mL Mouth Rinse BID  . sodium chloride flush  10-40 mL Intracatheter Q12H     LOS: 3 days   [name], MD [facility] Office  [phone] Pager - Text Page per [name] as per below:  On-Call/Text Page:      [name]      password [email or web address]  If 7PM-7AM, please contact night-coverage [email or web address] Password [employer] 09/12/2016, 5:45 PM

## 2016-09-12 NOTE — Progress Notes (Addendum)
Progress Note  Patient Name: [name] Date of Encounter: 09/12/2016  Primary Cardiologist: [facility]  Subjective   Feeling improved. Breathing slightly better.   Inpatient Medications    Scheduled Meds: . acetaminophen  650 mg Rectal Once  . aspirin  81 mg Oral Daily  . Chlorhexidine Gluconate Cloth  6 each Topical Daily  . mouth rinse  15 mL Mouth Rinse BID  . sodium chloride flush  10-40 mL Intracatheter Q12H   Continuous Infusions: . sodium chloride    . vancomycin Stopped (09/11/16 1903)   PRN Meds: sodium chloride, hydrALAZINE, HYDROmorphone, oxyCODONE-acetaminophen, sodium chloride flush   Vital Signs    Vitals:   09/12/16 0700 09/12/16 0730 09/12/16 0800 09/12/16 0830  BP: (!) 85/58 100/72 95/75 97/72   Pulse: 72 76 72 73  Resp: 18 19 20 20   Temp:   98.1 F (36.7 C)   TempSrc:   Oral   SpO2: 99% 94% 96% 96%  Weight:      Height:        Intake/Output Summary (Last 24 hours) at 09/12/16 0857 Last data filed at 09/12/16 0744  Gross per 24 hour  Intake              690 ml  Output             1425 ml  Net             -735 ml   Filed Weights   09/10/16 0232 09/11/16 0300 09/12/16 0430  Weight: 201 lb 3.2 oz (91.3 kg) 205 lb 0.4 oz (93 kg) 201 lb 15.1 oz (91.6 kg)    Telemetry    SR with episodes of SVT - Personally Reviewed  ECG    N/A - Personally Reviewed  Physical Exam   GEN: No acute distress.   Neck: No JVD Cardiac: RRR, no murmurs, rubs, or gallops.  Respiratory: Crackles up to mid lungs bilaterally. GI: Soft, nontender, non-distended  MS: 2+ edema; No deformity. Neuro:  Nonfocal  Psych: Normal affect   Labs    Chemistry Recent Labs Lab 09/11/2016 1717 09/10/16 0528  09/11/16 0315 09/11/16 1748 09/12/16 0440  NA 134* 136  < > 136 136 135  K 6.1* 6.2*  < > 4.2 3.5 3.4*  CL 99* 100*  < > 100* 100* 100*  CO2 12* 22  < > 26 26 27   GLUCOSE 87 105*  < > 102* 112* 107*  BUN 31* 36*  < > 34* 28* 22*  CREATININE 3.91* 3.72*  <  > 3.21* 2.78* 2.23*  CALCIUM 8.9 8.4*  < > 8.3* 8.1* 8.2*  PROT 8.1 7.1  --   --   --   --   ALBUMIN 3.4* 3.2*  --   --   --   --   AST 69* 54*  --   --   --   --   ALT 29 31  --   --   --   --   ALKPHOS 134* 105  --   --   --   --   BILITOT 4.1* 3.1*  --   --   --   --   GFRNONAA 18* 19*  < > 23* 27* 36*  GFRAA 21* 22*  < > 27* 32* 42*  ANIONGAP 23* 14  < > 10 10 8   < > = values in this interval not displayed.   Hematology  Recent Labs Lab 09/10/16 0528 09/11/16 0315 09/12/16  0440  WBC 21.4* 16.5* 13.4*  RBC 2.11*  2.13* 2.47* 2.61*  HGB 6.7* 7.6* 8.1*  HCT 20.0* 22.5* 24.4*  MCV 94.8 91.1 93.5  MCH 31.8 30.8 31.0  MCHC 33.5 33.8 33.2  RDW 20.2* 19.8* 20.8*  PLT 358 341 334    Cardiac EnzymesNo results for input(s): TROPONINI in the last 168 hours. No results for input(s): TROPIPOC in the last 168 hours.   BNP  Recent Labs Lab 09/08/2016 2053  BNP >4,500.0*     DDimer No results for input(s): DDIMER in the last 168 hours.   Radiology    No results found.  Cardiac Studies   TTE - Left ventricle: The cavity size was normal. Wall thickness was   increased in a pattern of moderate LVH. Systolic function was   mildly reduced. The estimated ejection fraction was in the range   of 45% to 50%. Diffuse hypokinesis. The study is not technically   sufficient to allow evaluation of LV diastolic function. - Ventricular septum: The contour showed diastolic flattening and   systolic flattening. - Mitral valve: Mechanical prosthesis. Normal tilting bileaflet   motion. No obstruction. Trivial MR. - Left atrium: The atrium was normal in size. - Right ventricle: Massively dilated with moderate RV systolic   dysfunction. - Right atrium: Severely dilated, - Tricuspid valve: Annuloplasty ring in place. Trivial TR. No   vegetation. - Pulmonic valve: There was mild regurgitation. - Pulmonary arteries: PA peak pressure: 41 mm Hg (S). - Inferior vena cava: The vessel was  dilated. The respirophasic   diameter changes were blunted (< 50%), consistent with elevated   central venous pressure.  Patient Profile     36 y.o. 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 No further changes. Goal INR 2.5-3.5. If coumadin stopped will need to bridge with heparin. 2. Mild systolic heart failure: Likely nonischemic. Volume overload with LE edema and increased BMP. BP low overnight. Would diurese as BP allows. Would start lasix 20 mg IV BID. Replace potassium, repeat ECG - prolonged QT. 3. Acute renal failure: Creatinine significantly elevated from baseline. Improving with diuresis.  4. Pulmonary hypertension: Likely the cause of his RV systolic dysfunction. significant LE edema. Continue diuresis 5. SVT: seen on monitor. Possibly due to volume overload. May improve as patient is diuresed. 6. Sepsis: antibiotics per primary team.  Signed, Ena Dawley, MD  09/12/2016, 8:57 AM

## 2016-09-13 ENCOUNTER — Encounter: Payer: Self-pay | Admitting: Pharmacist

## 2016-09-13 DIAGNOSIS — R0902 Hypoxemia: Secondary | ICD-10-CM | POA: Diagnosis not present

## 2016-09-13 DIAGNOSIS — N184 Chronic kidney disease, stage 4 (severe): Secondary | ICD-10-CM | POA: Diagnosis not present

## 2016-09-13 DIAGNOSIS — N171 Acute kidney failure with acute cortical necrosis: Secondary | ICD-10-CM | POA: Diagnosis not present

## 2016-09-13 DIAGNOSIS — I5043 Acute on chronic combined systolic (congestive) and diastolic (congestive) heart failure: Secondary | ICD-10-CM

## 2016-09-13 DIAGNOSIS — J9621 Acute and chronic respiratory failure with hypoxia: Secondary | ICD-10-CM | POA: Diagnosis not present

## 2016-09-13 DIAGNOSIS — Z952 Presence of prosthetic heart valve: Secondary | ICD-10-CM

## 2016-09-13 DIAGNOSIS — E872 Acidosis: Secondary | ICD-10-CM | POA: Diagnosis not present

## 2016-09-13 DIAGNOSIS — I330 Acute and subacute infective endocarditis: Secondary | ICD-10-CM | POA: Diagnosis not present

## 2016-09-13 DIAGNOSIS — I2699 Other pulmonary embolism without acute cor pulmonale: Secondary | ICD-10-CM | POA: Diagnosis not present

## 2016-09-13 DIAGNOSIS — D6832 Hemorrhagic disorder due to extrinsic circulating anticoagulants: Secondary | ICD-10-CM | POA: Diagnosis not present

## 2016-09-13 DIAGNOSIS — I5041 Acute combined systolic (congestive) and diastolic (congestive) heart failure: Secondary | ICD-10-CM

## 2016-09-13 DIAGNOSIS — A4102 Sepsis due to Methicillin resistant Staphylococcus aureus: Secondary | ICD-10-CM | POA: Diagnosis not present

## 2016-09-13 DIAGNOSIS — N179 Acute kidney failure, unspecified: Secondary | ICD-10-CM | POA: Diagnosis not present

## 2016-09-13 LAB — TYPE AND SCREEN
ABO/RH(D): O POS
Antibody Screen: NEGATIVE
UNIT DIVISION: 0
Unit division: 0

## 2016-09-13 LAB — COMPREHENSIVE METABOLIC PANEL
ALK PHOS: 92 U/L (ref 38–126)
ALT: 29 U/L (ref 17–63)
AST: 34 U/L (ref 15–41)
Albumin: 2.7 g/dL — ABNORMAL LOW (ref 3.5–5.0)
Anion gap: 7 (ref 5–15)
BILIRUBIN TOTAL: 2.2 mg/dL — AB (ref 0.3–1.2)
BUN: 14 mg/dL (ref 6–20)
CALCIUM: 7.8 mg/dL — AB (ref 8.9–10.3)
CO2: 25 mmol/L (ref 22–32)
CREATININE: 1.68 mg/dL — AB (ref 0.61–1.24)
Chloride: 103 mmol/L (ref 101–111)
GFR, EST AFRICAN AMERICAN: 59 mL/min — AB (ref >60)
GFR, EST NON AFRICAN AMERICAN: 51 mL/min — AB (ref >60)
Glucose, Bld: 105 mg/dL — ABNORMAL HIGH (ref 65–99)
Potassium: 3.6 mmol/L (ref 3.5–5.1)
Sodium: 135 mmol/L (ref 135–145)
TOTAL PROTEIN: 6.2 g/dL — AB (ref 6.5–8.1)

## 2016-09-13 LAB — BPAM RBC
BLOOD PRODUCT EXPIRATION DATE: 201808172359
Blood Product Expiration Date: 201808172359
ISSUE DATE / TIME: 201808062337
ISSUE DATE / TIME: 201808111751
UNIT TYPE AND RH: 9500
Unit Type and Rh: 9500

## 2016-09-13 LAB — PATHOLOGIST SMEAR REVIEW

## 2016-09-13 LAB — VANCOMYCIN, TROUGH: VANCOMYCIN TR: 13 ug/mL — AB (ref 15–20)

## 2016-09-13 LAB — PROTIME-INR
INR: 3.58
Prothrombin Time: 36.6 seconds — ABNORMAL HIGH (ref 11.4–15.2)

## 2016-09-13 MED ORDER — VANCOMYCIN HCL 500 MG IV SOLR
500.0000 mg | Freq: Once | INTRAVENOUS | Status: AC
  Administered 2016-09-13: 500 mg via INTRAVENOUS
  Filled 2016-09-13: qty 500

## 2016-09-13 MED ORDER — MORPHINE SULFATE ER 15 MG PO TBCR
30.0000 mg | EXTENDED_RELEASE_TABLET | Freq: Two times a day (BID) | ORAL | Status: DC
  Administered 2016-09-13 – 2016-09-15 (×6): 30 mg via ORAL
  Filled 2016-09-13: qty 2
  Filled 2016-09-13: qty 1
  Filled 2016-09-13 (×5): qty 2

## 2016-09-13 MED ORDER — HYDROMORPHONE HCL 1 MG/ML IJ SOLN
0.5000 mg | INTRAMUSCULAR | Status: DC | PRN
  Administered 2016-09-13 – 2016-09-14 (×4): 0.5 mg via INTRAVENOUS
  Filled 2016-09-13 (×4): qty 0.5

## 2016-09-13 MED ORDER — HYDROMORPHONE HCL 2 MG PO TABS
4.0000 mg | ORAL_TABLET | ORAL | Status: DC | PRN
Start: 2016-09-13 — End: 2016-09-16
  Administered 2016-09-14: 4 mg via ORAL
  Filled 2016-09-13 (×3): qty 2

## 2016-09-13 MED ORDER — HYDROMORPHONE HCL 2 MG PO TABS
1.0000 mg | ORAL_TABLET | ORAL | Status: DC | PRN
  Administered 2016-09-13 (×2): 1 mg via ORAL
  Filled 2016-09-13 (×2): qty 1

## 2016-09-13 MED ORDER — VANCOMYCIN HCL 10 G IV SOLR
1500.0000 mg | INTRAVENOUS | Status: DC
  Administered 2016-09-14 – 2016-09-15 (×2): 1500 mg via INTRAVENOUS
  Filled 2016-09-13 (×2): qty 1500

## 2016-09-13 NOTE — Progress Notes (Signed)
PROGRESS NOTE    DESIDERIO DOLATA  IWP:[id number] DOB: 03/01/1980 DOA: 09/01/2016 PCP: Tresa Garter, MD    Brief Narrative:  36 year old male who presented with the chief complaint of respiratory distress. Patient is known to have history of sickle cell disease, MRSA bacteremia, mitral valve endocarditis status post mechanical mitral valve replacement and tricuspid valve ring at [facility] July 2018. Along with dyspnea patient reported worsening lower extremity edema for about 7 days. On initial physical examination blood pressure 137/82, heart rate 124, temperature 102.5, respiratory 28, oxygen saturation 99%. On noninvasive mechanical ventilation. Her lungs had decreased presence bilaterally with increased work of breathing, heart S1-S2 present tachycardic, the abdomen was soft nontender, positive pitting bilateral lower extremity edema after the knees. Patient was noted to have lactic acidosis.   Patient was admitted to the hospital with working diagnosis of suspected sepsis, complicated by lactic acidosis and acute heart failure.   Assessment & Plan:   Active Problems:   Acute on chronic respiratory failure with hypoxia (HCC)  1. Acute hypoxic respiratory failure. Will continue diuresis with furosemide, urine output 2,450 cc over last 24 hours, dyspnea has improved, echocardiogram with no ejection fraction 45 to 50%, diffuse hypokynesis, mitral valve with normal functioning.   2. History of tricuspid and mitral valve endocarditis. No bacteremia or valvular vegetation, will continue close monitoring.   3. Pulmonary embolism. Continue anticoagulation with apixaban,   4. Nonischemic cardiomyopathy, diastolic heart failure. Will continue diuresis with furosemide, will target negative fluid balance, keep MAP more than 60.   5. AKI. Serum cr at 1,68 from 1,87, K at 3,6 and serum bicarbonate at 25. Will follow on renal function in am, avoid hypotension or  nephrotoxic medications.   6. Sickle cell anemia. Stable hb and hct, patient complains of pain, will resume home dose of morphine and hydromorphone with a low dose IV hydromorphone for break through pain.     DVT prophylaxis: warfarin Code Status: Full  Family Communication:  Disposition Plan: Home    Consultants:   Pulmonary  Cardiology   Procedures:  TEE  Antimicrobials:    Subjective: Dyspnea is improving, but still persistent, no nausea or vomiting, complains of chest pain, severe in intensity, requesting IV hydromorphone. Persistent lower extremity edema.   Objective: Vitals:   09/13/16 0930 09/13/16 1000 09/13/16 1100 09/13/16 1200  BP: 103/78 101/71 (!) 88/62   Pulse: 81 81 81   Resp: (!) 24 20 (!) 21   Temp:    97.9 F (36.6 C)  TempSrc:    Oral  SpO2: 94% 95% 97%   Weight:      Height:        Intake/Output Summary (Last 24 hours) at 09/13/16 1437 Last data filed at 09/13/16 1000  Gross per 24 hour  Intake              440 ml  Output             2275 ml  Net            -1835 ml   Filed Weights   09/11/16 0300 09/12/16 0430 09/13/16 0232  Weight: 93 kg (205 lb 0.4 oz) 91.6 kg (201 lb 15.1 oz) 90.4 kg (199 lb 4.7 oz)    Examination:  General exam: deconditioned E ENT: no pallor or icterus, oral mucosa moist.  Respiratory system: Scattered rhonchi. No rales or wheezing.  Respiratory effort normal. Cardiovascular system: S1 & S2 heard, RRR. No JVD,  murmurs, rubs, gallops or clicks. Positive edema ++ pitting up to the knees.  Gastrointestinal system: Abdomen is nondistended, soft and nontender. No organomegaly or masses felt. Normal bowel sounds heard. Central nervous system: Alert and oriented. No focal neurological deficits. Extremities: Symmetric 5 x 5 power. Skin: No rashes, lesions or ulcers     Data Reviewed: I have personally reviewed following labs and imaging studies  CBC:  Recent Labs Lab 09/02/2016 1717 09/10/16 0528 09/11/16 0315  09/12/16 0440  WBC 23.7* 21.4* 16.5* 13.4*  NEUTROABS 19.2* 17.1* 13.1* 10.4*  HGB 7.6* 6.7* 7.6* 8.1*  HCT 23.0* 20.0* 22.5* 24.4*  MCV 96.2 94.8 91.1 93.5  PLT 423* 358 341 741   Basic Metabolic Panel:  Recent Labs Lab 09/28/2016 2053 09/10/16 0528  09/11/16 0315 09/11/16 1748 09/12/16 0440 09/12/16 1200 09/13/16 0217  NA  --  136  < > 136 136 135 136 135  K  --  6.2*  < > 4.2 3.5 3.4* 3.5 3.6  CL  --  100*  < > 100* 100* 100* 105 103  CO2  --  22  < > 26 26 27 25 25   GLUCOSE  --  105*  < > 102* 112* 107* 108* 105*  BUN  --  36*  < > 34* 28* 22* 18 14  CREATININE  --  3.72*  < > 3.21* 2.78* 2.23* 1.87* 1.68*  CALCIUM  --  8.4*  < > 8.3* 8.1* 8.2* 7.8* 7.8*  MG 2.5* 2.5*  --  2.4 2.3 2.2  --   --   PHOS  --  6.6*  --  6.2*  --  4.4  --   --   < > = values in this interval not displayed. GFR: Estimated Creatinine Clearance: 68 mL/min (A) (by C-G formula based on SCr of 1.68 mg/dL (H)). Liver Function Tests:  Recent Labs Lab 09/27/2016 1717 09/10/16 0528 09/13/16 0217  AST 69* 54* 34  ALT 29 31 29   ALKPHOS 134* 105 92  BILITOT 4.1* 3.1* 2.2*  PROT 8.1 7.1 6.2*  ALBUMIN 3.4* 3.2* 2.7*   No results for input(s): LIPASE, AMYLASE in the last 168 hours. No results for input(s): AMMONIA in the last 168 hours. Coagulation Profile:  Recent Labs Lab 09/20/2016 1717 09/10/16 0528 09/11/16 0315 09/12/16 0440 09/13/16 0217  INR 7.73* 5.83* 5.05* 4.83* 3.58   Cardiac Enzymes: No results for input(s): CKTOTAL, CKMB, CKMBINDEX, TROPONINI in the last 168 hours. BNP (last 3 results) No results for input(s): PROBNP in the last 8760 hours. HbA1C: No results for input(s): HGBA1C in the last 72 hours. CBG: No results for input(s): GLUCAP in the last 168 hours. Lipid Profile: No results for input(s): CHOL, HDL, LDLCALC, TRIG, CHOLHDL, LDLDIRECT in the last 72 hours. Thyroid Function Tests: No results for input(s): TSH, T4TOTAL, FREET4, T3FREE, THYROIDAB in the last 72  hours. Anemia Panel: No results for input(s): VITAMINB12, FOLATE, FERRITIN, TIBC, IRON, RETICCTPCT in the last 72 hours. Sepsis Labs:  Recent Labs Lab 08/31/2016 1728 09/19/2016 2048 09/25/2016 2053 09/10/16 0528 09/10/16 0529 09/11/16 0315  PROCALCITON  --   --  4.81 12.30  --  11.01  LATICACIDVEN 12.82* 6.17* 5.3*  --  1.1  --     Recent Results (from the past 240 hour(s))  Culture, blood (Routine x 2)     Status: None (Preliminary result)   Collection Time: 09/18/2016  5:30 PM  Result Value Ref Range Status   Specimen Description BLOOD LEFT  ARM  Final   Special Requests   Final    BOTTLES DRAWN AEROBIC AND ANAEROBIC Blood Culture adequate volume   Culture NO GROWTH 4 DAYS  Final   Report Status PENDING  Incomplete  Culture, blood (Routine x 2)     Status: None (Preliminary result)   Collection Time: 09/04/2016  8:40 PM  Result Value Ref Range Status   Specimen Description BLOOD RIGHT HAND  Final   Special Requests IN PEDIATRIC BOTTLE Blood Culture adequate volume  Final   Culture NO GROWTH 3 DAYS  Final   Report Status PENDING  Incomplete         Radiology Studies: No results found.      Scheduled Meds: . aspirin  81 mg Oral Daily  . Chlorhexidine Gluconate Cloth  6 each Topical Daily  . folic acid  2 mg Oral Daily  . furosemide  20 mg Intravenous BID  . gabapentin  100 mg Oral TID  . hydroxyurea  1,000 mg Oral QODAY  . mouth rinse  15 mL Mouth Rinse BID  . sodium chloride flush  10-40 mL Intracatheter Q12H   Continuous Infusions: . vancomycin Stopped (09/12/16 2029)     LOS: 4 days        Mauricio Gerome Apley, MD [facility] Pager [phone]  If 7PM-7AM, please contact night-coverage [email or web address] Password [facility] 09/13/2016, 2:37 PM

## 2016-09-13 NOTE — Progress Notes (Signed)
Pharmacy Antibiotic Note  [name] is a 36 y.o. male admitted on 09/17/2016 with sepsis. Hx of endocarditis, recently d/c'd from [place]. Pharmacy has been consulted for vancomycin/zosyn dosing.   Scr decreased further this evening to 1.68 A vancomycin trough tonight is below goal at 13 mcg/dL  Plan: Zosyn 3.375g IV q8h (4h infusion) Increase Vancomycin to 1500 mg iv Q 24 hours Monitor clinical progress, c/s, renal function and repeat levels F/u de-escalation plan/LOT   Height: 6\' 1"  (185.4 cm) Weight: 198 lb 6.6 oz (90 kg) IBW/kg (Calculated) : 79.9  Temp (24hrs), Avg:97.9 F (36.6 C), Min:97.6 F (36.4 C), Max:98.5 F (36.9 C)   Recent Labs Lab 09/08/2016 1717 09/23/2016 1728 09/07/2016 2048 09/15/2016 2053 09/10/16 0528 09/10/16 0529  09/11/16 0315 09/11/16 1748 09/12/16 0440 09/12/16 1200 09/12/16 1750 09/13/16 0217 09/13/16 1954  WBC 23.7*  --   --   --  21.4*  --   --  16.5*  --  13.4*  --   --   --   --   CREATININE 3.91*  --   --   --  3.72*  --   < > 3.21* 2.78* 2.23* 1.87*  --  1.68*  --   LATICACIDVEN  --  12.82* 6.17* 5.3*  --  1.1  --   --   --   --   --   --   --   --   VANCOTROUGH  --   --   --   --   --   --   --   --   --   --   --  18  --  13*  < > = values in this interval not displayed.  Estimated Creatinine Clearance: 68 mL/min (A) (by C-G formula based on SCr of 1.68 mg/dL (H)).    No Known Allergies  Antimicrobials this admission: 8/10 vancomycin >>  8/10 zosyn >>   Dose adjustments this admission: 8/13 VT = 46mcg/mL on 1g IV q24h- continued 8/14 VT = 13 mcg/dL -> increase to 1500 mg iv Q 24 hours  Microbiology results: 8/10 BCx: ngtd  Thank you Anette Guarneri, PharmD [phone] 09/13/2016 9:05 PM

## 2016-09-13 NOTE — Progress Notes (Signed)
Physical Therapy Treatment Patient Details Name: [name] MRN: [id number] DOB: 07/20/1980 Today's Date: 09/13/2016    History of Present Illness Pt is a 36 y/o male who presents s/p sickle cell crisis. 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 Pt presents to the ED with sudden SOB and pitting edema in bilateral LE's.     PT Comments    Pt progressing well towards physical therapy goals. Was able to tolerate gait training this session with HR remaining stable in low to mid 90's, and O2 fairly stable with occasional drops <85% (questionable waveform - unclear whether this was accurate). Pt reports feeling better today. Anticipate quick progress with continued therapy interventions. Will continue to follow.    Follow Up Recommendations  Outpatient PT;Supervision for mobility/OOB     Equipment Recommendations  Other (comment) (Depends on progress with PT. May need an AD)    Recommendations for Other Services       Precautions / Restrictions Precautions Precautions: Fall Restrictions Weight Bearing Restrictions: No    Mobility  Bed Mobility Overal bed mobility: Needs Assistance Bed Mobility: Supine to Sit     Supine to sit: Supervision     General bed mobility comments: HOB elevated. Pt was able to transition to EOB with supervision for safety and increased time.   Transfers Overall transfer level: Needs assistance Equipment used: None Transfers: Sit to/from Stand Sit to Stand: Min guard         General transfer comment: Hands-on guarding required for balance support and safety. Assist mainly for management of lines. He was able to power up from the EOB at the lowest height.   Ambulation/Gait Ambulation/Gait assistance: Min guard;Supervision Ambulation Distance (Feet):  175 Feet Assistive device: None (Held to IV pole for support) Gait Pattern/deviations: Step-through pattern;Decreased stride length;Trunk flexed Gait velocity: Decreased Gait velocity interpretation: Below normal speed for age/gender General Gait Details: HR remained in low to mid 90's and O2 sats fluctuated on 2L/min supplemental O2 (questionable waveform). Pt took 1 seated rest break 2 fatigue.    Stairs            Wheelchair Mobility    Modified Rankin (Stroke Patients Only)       Balance Overall balance assessment: Needs assistance Sitting-balance support: Feet supported;No upper extremity supported Sitting balance-Leahy Scale: Good     Standing balance support: No upper extremity supported;During functional activity Standing balance-Leahy Scale: Fair                              Cognition Arousal/Alertness: Awake/alert Behavior During Therapy: Flat affect Overall Cognitive Status: Within Functional Limits for tasks assessed                                        Exercises      General Comments        Pertinent Vitals/Pain Pain Assessment: Faces Faces Pain Scale: No hurt    Home Living                      Prior Function            PT  Goals (current goals can now be found in the care plan section) Acute Rehab PT Goals Patient Stated Goal: Return home PT Goal Formulation: With patient Time For Goal Achievement: 09/19/16 Potential to Achieve Goals: Good Progress towards PT goals: Progressing toward goals    Frequency    Min 3X/week      PT Plan Current plan remains appropriate    Co-evaluation              AM-PAC PT "6 Clicks" Daily Activity  Outcome Measure  Difficulty turning over in bed (including adjusting bedclothes, sheets and blankets)?: None Difficulty moving from lying on back to sitting on the side of the bed? : A Little Difficulty sitting down on and standing up from a chair with  arms (e.g., wheelchair, bedside commode, etc,.)?: A Little Help needed moving to and from a bed to chair (including a wheelchair)?: A Little Help needed walking in hospital room?: A Little Help needed climbing 3-5 steps with a railing? : A Lot 6 Click Score: 18    End of Session Equipment Utilized During Treatment: Gait belt;Oxygen Activity Tolerance: Patient tolerated treatment well Patient left: in chair;with call bell/phone within reach;with chair alarm set;with nursing/sitter in room Nurse Communication: Mobility status PT Visit Diagnosis: Unsteadiness on feet (R26.81);Pain;Difficulty in walking, not elsewhere classified (R26.2) Pain - part of body:  (chest)     Time: 2836-6294 PT Time Calculation (min) (ACUTE ONLY): 24 min  Charges:  $Gait Training: 23-37 mins                    G Codes:       Rolinda Roan, PT, DPT Acute Rehabilitation Services Pager: [name] 09/13/2016, 1:44 PM

## 2016-09-13 NOTE — Progress Notes (Signed)
Progress Note  Patient Name: [name] Date of Encounter: 09/13/2016  Primary Cardiologist: [facility]  Subjective   No complaints. Resting comfortably. No dyspnea but he remains on supplemental O2 via [place].  Inpatient Medications    Scheduled Meds: . aspirin  81 mg Oral Daily  . Chlorhexidine Gluconate Cloth  6 each Topical Daily  . folic acid  2 mg Oral Daily  . furosemide  20 mg Intravenous BID  . gabapentin  100 mg Oral TID  . hydroxyurea  1,000 mg Oral QODAY  . mouth rinse  15 mL Mouth Rinse BID  . sodium chloride flush  10-40 mL Intracatheter Q12H   Continuous Infusions: . vancomycin Stopped (09/12/16 2029)   PRN Meds: acetaminophen, hydrALAZINE, HYDROmorphone, oxyCODONE, sodium chloride flush   Vital Signs    Vitals:   09/13/16 0500 09/13/16 0600 09/13/16 0700 09/13/16 0800  BP: (!) 76/64 90/75 93/71    Pulse: 78 77 74   Resp: 15 17 15    Temp:    97.6 F (36.4 C)  TempSrc:    Oral  SpO2: 95% 96% 98%   Weight:      Height:        Intake/Output Summary (Last 24 hours) at 09/13/16 1001 Last data filed at 09/13/16 0500  Gross per 24 hour  Intake              680 ml  Output             2075 ml  Net            -1395 ml   Filed Weights   09/11/16 0300 09/12/16 0430 09/13/16 0232  Weight: 205 lb 0.4 oz (93 kg) 201 lb 15.1 oz (91.6 kg) 199 lb 4.7 oz (90.4 kg)    Telemetry    NSR currently,  PSVT  - Personally Reviewed  ECG    NSR w/ RBBB - Personally Reviewed  Physical Exam   GEN: No acute distress.   Neck: No JVD Cardiac: RRR, no murmurs, rubs, or gallops.  Respiratory: faint crackles in the LLL. GI: Soft, nontender, non-distended  MS: bilateral LEE, R>L 2+ pitting pretibial edema on the right; No deformity. Neuro:  Nonfocal  Psych: Normal affect   Labs    Chemistry Recent Labs Lab 09/14/2016 1717 09/10/16 0528  09/12/16 0440 09/12/16 1200 09/13/16 0217  NA 134* 136  < > 135 136 135  K 6.1* 6.2*  < > 3.4* 3.5 3.6  CL 99* 100*  < >  100* 105 103  CO2 12* 22  < > 27 25 25   GLUCOSE 87 105*  < > 107* 108* 105*  BUN 31* 36*  < > 22* 18 14  CREATININE 3.91* 3.72*  < > 2.23* 1.87* 1.68*  CALCIUM 8.9 8.4*  < > 8.2* 7.8* 7.8*  PROT 8.1 7.1  --   --   --  6.2*  ALBUMIN 3.4* 3.2*  --   --   --  2.7*  AST 69* 54*  --   --   --  34  ALT 29 31  --   --   --  29  ALKPHOS 134* 105  --   --   --  92  BILITOT 4.1* 3.1*  --   --   --  2.2*  GFRNONAA 18* 19*  < > 36* 44* 51*  GFRAA 21* 22*  < > 42* 51* 59*  ANIONGAP 23* 14  < > 8 6 7   < > =  values in this interval not displayed.   Hematology Recent Labs Lab 09/10/16 0528 09/11/16 0315 09/12/16 0440  WBC 21.4* 16.5* 13.4*  RBC 2.11*  2.13* 2.47* 2.61*  HGB 6.7* 7.6* 8.1*  HCT 20.0* 22.5* 24.4*  MCV 94.8 91.1 93.5  MCH 31.8 30.8 31.0  MCHC 33.5 33.8 33.2  RDW 20.2* 19.8* 20.8*  PLT 358 341 334    Cardiac EnzymesNo results for input(s): TROPONINI in the last 168 hours. No results for input(s): TROPIPOC in the last 168 hours.   BNP Recent Labs Lab 09/08/2016 2053  BNP >4,500.0*     DDimer No results for input(s): DDIMER in the last 168 hours.   Radiology    No results found.  Cardiac Studies   TTE 09/17/16  - Left ventricle: The cavity size was normal. Wall thickness was increased in a pattern of moderate LVH. Systolic function was mildly reduced. The estimated ejection fraction was in the range of 45% to 50%. Diffuse hypokinesis. The study is not technically sufficient to allow evaluation of LV diastolic function. - Ventricular septum: The contour showed diastolic flattening and systolic flattening. - Mitral valve: Mechanical prosthesis. Normal tilting bileaflet motion. No obstruction. Trivial MR. - Left atrium: The atrium was normal in size. - Right ventricle: Massively dilated with moderate RV systolic dysfunction. - Right atrium: Severely dilated, - Tricuspid valve: Annuloplasty ring in place. Trivial TR. No vegetation. - Pulmonic  valve: There was mild regurgitation. - Pulmonary arteries: intact tricuspid annuloplasty ring with trivial TR,   mechanical MVR wtihout obtruction. No evidence for endocarditis.  (S). - Inferior vena cava: The vessel was dilated. The respirophasic diameter changes were blunted (<50%), consistent with elevated central venous pressure.  Impressions:  - Compared to a prior study in 07/2016, the LVEF has mildly   declined. There is moderate RV systolic dysfunction, severe RAE   and RVE, intact tricuspid annuloplasty ring with trivial TR,   mechanical MVR wtihout obtruction. No evidence for endocarditis.  Patient Profile     36 y.o. 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 2D echo this admit shows intact tricuspid annuloplasty ring with trivial TR, mechanical MVR wtihout obtruction. No evidence for endocarditis.  2. Mild Systolic HF w/ acute exacerbation: compared to prior echo 07/2016, his LVEF has mildly declined, now at 45-50%. This is likely nonischemic. He was admitted with volume overload and markedly abnormal BNP at >4,500. He was started on IV Lasix. Good UOP. - 2.4L out yesterday. Total I/Os net negative 2L. SCr is improving with diuresis, down from 2.23>>1.87>>1.68. K is stable at 3.6. He is still with rales and LE pitting edema on exam. Continue diuresis. Continue to monitor renal function closely while diuresising.   3. AKI: Scr significantly elevated from baseline but has been gradually improving with diuresis, down from 2.23>>1.87>>1.68. Continue to monitor.   4. Pulmonary HTN: 2D echo shows elevated PA pressure at 41 mm Hg. Likely the cause of his RV systolic dysfunction. Continue with diuresis.   5. SVT: noted during this admission. Paroxsysmal. Currently  NSR. Continue to monitor.   6. Sepsis: per primary team. On antibiotics.   Signed, Lyda Jester, PA-C  09/13/2016, 10:01 AM    The patient was seen, examined and discussed with Brittainy M. Ladoris Gene and I agree  with the above.   The patient diuresed well after we added lasix 20 mg IV BID, - 1.4 L, down 6 lbs, Crea improving 3.2 -> 1.68. BP in 90', , continue diuresis at the same rate. Telemetry with SR.  Ena Dawley, MD 09/13/2016

## 2016-09-13 NOTE — Consult Note (Signed)
   [facility] CM Inpatient Consult   09/13/2016  [name] January 06, 1981 [id number]   Patient assessed for multiple hospitalizations and ED visitand for restart of services in the Medicare ACO .  Met with the patient at the bedside. 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   Patient denies needs for post hospital services.  States he needs a new mattress for his hospital bed.  He has a hospital bed from [place].  He denies issues for care management follow up.  Will follow up with inpatient RNCM on patient's DME needs.  Patient said he had his surgery at [facility] last month. Patient declined [place] Management services for post hospital follow up.  Patient did accept a brochure and 24 hour nurse line magnet. For questions please contact:    Natividad Brood, RN BSN [place] Liaison  [phone] business mobile phone Toll free office [phone]

## 2016-09-13 NOTE — Progress Notes (Signed)
Pt received from [place] - oriented to room

## 2016-09-14 DIAGNOSIS — E872 Acidosis: Secondary | ICD-10-CM | POA: Diagnosis not present

## 2016-09-14 DIAGNOSIS — I330 Acute and subacute infective endocarditis: Secondary | ICD-10-CM | POA: Diagnosis not present

## 2016-09-14 DIAGNOSIS — N184 Chronic kidney disease, stage 4 (severe): Secondary | ICD-10-CM | POA: Diagnosis not present

## 2016-09-14 DIAGNOSIS — I509 Heart failure, unspecified: Secondary | ICD-10-CM | POA: Diagnosis not present

## 2016-09-14 DIAGNOSIS — N171 Acute kidney failure with acute cortical necrosis: Secondary | ICD-10-CM | POA: Diagnosis not present

## 2016-09-14 DIAGNOSIS — I2699 Other pulmonary embolism without acute cor pulmonale: Secondary | ICD-10-CM | POA: Diagnosis not present

## 2016-09-14 DIAGNOSIS — A4102 Sepsis due to Methicillin resistant Staphylococcus aureus: Secondary | ICD-10-CM | POA: Diagnosis not present

## 2016-09-14 DIAGNOSIS — R0902 Hypoxemia: Secondary | ICD-10-CM | POA: Diagnosis not present

## 2016-09-14 DIAGNOSIS — J9621 Acute and chronic respiratory failure with hypoxia: Secondary | ICD-10-CM | POA: Diagnosis not present

## 2016-09-14 DIAGNOSIS — I5041 Acute combined systolic (congestive) and diastolic (congestive) heart failure: Secondary | ICD-10-CM | POA: Diagnosis not present

## 2016-09-14 DIAGNOSIS — N179 Acute kidney failure, unspecified: Secondary | ICD-10-CM | POA: Diagnosis not present

## 2016-09-14 DIAGNOSIS — D6832 Hemorrhagic disorder due to extrinsic circulating anticoagulants: Secondary | ICD-10-CM | POA: Diagnosis not present

## 2016-09-14 LAB — CULTURE, BLOOD (ROUTINE X 2)
Culture: NO GROWTH
SPECIAL REQUESTS: ADEQUATE

## 2016-09-14 LAB — MRSA PCR SCREENING: MRSA BY PCR: NEGATIVE

## 2016-09-14 LAB — PROTIME-INR
INR: 2.34
Prothrombin Time: 26.1 seconds — ABNORMAL HIGH (ref 11.4–15.2)

## 2016-09-14 MED ORDER — HYDROMORPHONE HCL 1 MG/ML IJ SOLN
0.5000 mg | INTRAMUSCULAR | Status: DC | PRN
  Administered 2016-09-14 – 2016-09-16 (×8): 0.5 mg via INTRAVENOUS
  Filled 2016-09-14 (×9): qty 0.5

## 2016-09-14 MED ORDER — WARFARIN - PHARMACIST DOSING INPATIENT: Freq: Every day | Status: DC

## 2016-09-14 MED ORDER — WARFARIN SODIUM 5 MG PO TABS
5.0000 mg | ORAL_TABLET | Freq: Once | ORAL | Status: AC
  Administered 2016-09-14: 5 mg via ORAL
  Filled 2016-09-14: qty 1

## 2016-09-14 NOTE — Progress Notes (Addendum)
[name] TEAM 1 - Stepdown/ICU TEAM  [name]  SEG:[id number] DOB: 1980-09-16 DOA: 09/30/2016 PCP: Tresa Garter, MD    Brief Narrative:  36yo male with a history of sickle cell anemia and pulmonary hypertension with prior pulmonary emboli who was found to have a MRSA bacteremia in July 2018.  He was ultimately found to have a 2.6 cm mitral valve vegetation on TEE with a possible PFO.  He underwent mechanical mitral valve replacement as well as tricuspid valve ring on 7/17 at [facility].  He presented to [facility] on [facility]/10 with resp distress with severe lactic acidosis & coagulopathy.    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  He denies significant shortness of breath at rest.  He denies uncontrolled pain nausea vomiting or abdominal pain.  He states that he is moving his bowels.  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 at [facility] July 2018 Valves functioning properly per TTE per Cards service   Coumadin induced coagulopathy Goal INR is 2.5-3.5 - INR now within therapeutic range  Mild nonishcemic systolic CHF EF 73-71% - some peripheral edema persists - tolerating diuresis thus far - agree with continuing diuretic as per cardiology service Filed Weights   09/13/16 0232 09/13/16 1912 09/14/16 0416  Weight: 90.4 kg (199 lb 4.7 oz) 90 kg (198 lb 6.6 oz) 89.5 kg (197 lb  5 oz)    Lactic acidosis Resolved   Acute kidney injury  crt continues to improve - follow trend   Recent Labs Lab 09/11/16 0315 09/11/16 1748 09/12/16 0440 09/12/16 1200 09/13/16 0217  CREATININE 3.21* 2.78* 2.23* 1.87* 1.68*    Sickle Cell Anemia   Chronic pain  On chronic high dose narcotics - pain well controlled at time of exam today  DVT prophylaxis: warfarin  Code Status: FULL CODE Family Communication: no family present at time of exam  Disposition Plan: Stable for telemetry bed but will not move patient as he was just moved yesterday  Consultants:  PCCM Cardiology  Antimicrobials:  Zosyn 8/10 > 8/11 Vanc 8/10 >   Objective: Blood pressure 105/69, pulse 82, temperature 98.3 F (36.8 C), temperature source Oral, resp. rate 19, height 6\' 1"  (1.854 m), weight 89.5 kg (197 lb 5 oz), SpO2 93 %.  Intake/Output Summary (Last 24 hours) at 09/14/16 1338 Last data filed at 09/14/16 0953  Gross per 24 hour  Intake              303 ml  Output             1050 ml  Net             -747 ml   Filed Weights   09/13/16 0232 09/13/16 1912 09/14/16 0416  Weight: 90.4 kg (199 lb 4.7 oz) 90 kg (198 lb 6.6 oz) 89.5 kg (197 lb 5 oz)    Examination: General: No acute respiratory  distress Lungs: CTA th/o w/ exception to poor air movement B bases - no wheezing  Cardiovascular: Regular rate and rhythm without murmur Abdomen: Nontender, nondistended, soft, bowel sounds positive, no rebound, no ascites, no appreciable mass Extremities: 1+ B LE edema persists   CBC:  Recent Labs Lab 09/26/2016 1717 09/10/16 0528 09/11/16 0315 09/12/16 0440  WBC 23.7* 21.4* 16.5* 13.4*  NEUTROABS 19.2* 17.1* 13.1* 10.4*  HGB 7.6* 6.7* 7.6* 8.1*  HCT 23.0* 20.0* 22.5* 24.4*  MCV 96.2 94.8 91.1 93.5  PLT 423* 358 341 245   Basic Metabolic Panel:  Recent Labs Lab 09/10/2016 2053 09/10/16 0528  09/11/16 0315 09/11/16 1748 09/12/16 0440 09/12/16 1200 09/13/16 0217  NA  --  136  <  > 136 136 135 136 135  K  --  6.2*  < > 4.2 3.5 3.4* 3.5 3.6  CL  --  100*  < > 100* 100* 100* 105 103  CO2  --  22  < > 26 26 27 25 25   GLUCOSE  --  105*  < > 102* 112* 107* 108* 105*  BUN  --  36*  < > 34* 28* 22* 18 14  CREATININE  --  3.72*  < > 3.21* 2.78* 2.23* 1.87* 1.68*  CALCIUM  --  8.4*  < > 8.3* 8.1* 8.2* 7.8* 7.8*  MG 2.5* 2.5*  --  2.4 2.3 2.2  --   --   PHOS  --  6.6*  --  6.2*  --  4.4  --   --   < > = values in this interval not displayed. GFR: Estimated Creatinine Clearance: 68 mL/min (A) (by C-G formula based on SCr of 1.68 mg/dL (H)).  Liver Function Tests:  Recent Labs Lab 09/01/2016 1717 09/10/16 0528 09/13/16 0217  AST 69* 54* 34  ALT 29 31 29   ALKPHOS 134* 105 92  BILITOT 4.1* 3.1* 2.2*  PROT 8.1 7.1 6.2*  ALBUMIN 3.4* 3.2* 2.7*   Coagulation Profile:  Recent Labs Lab 09/10/16 0528 09/11/16 0315 09/12/16 0440 09/13/16 0217 09/14/16 0427  INR 5.83* 5.05* 4.83* 3.58 2.34    Recent Results (from the past 240 hour(s))  Culture, blood (Routine x 2)     Status: None   Collection Time: 09/24/2016  5:30 PM  Result Value Ref Range Status   Specimen Description BLOOD LEFT ARM  Final   Special Requests   Final    BOTTLES DRAWN AEROBIC AND ANAEROBIC Blood Culture adequate volume   Culture NO GROWTH 5 DAYS  Final   Report Status 09/14/2016 FINAL  Final  Culture, blood (Routine x 2)     Status: None (Preliminary result)   Collection Time: 09/18/2016  8:40 PM  Result Value Ref Range Status   Specimen Description BLOOD RIGHT HAND  Final   Special Requests IN PEDIATRIC BOTTLE Blood Culture adequate volume  Final   Culture NO GROWTH 4 DAYS  Final   Report Status PENDING  Incomplete  MRSA PCR Screening     Status: None   Collection Time: 09/13/16 11:11 PM  Result Value Ref Range Status   MRSA by PCR NEGATIVE NEGATIVE Final    Comment:        The GeneXpert MRSA Assay (FDA approved for NASAL specimens only), is one component of a comprehensive MRSA  colonization surveillance program. It is not intended to diagnose MRSA infection nor to guide or monitor treatment for MRSA infections.      Scheduled Meds: . aspirin  81 mg Oral Daily  . Chlorhexidine Gluconate Cloth  6 each Topical Daily  . folic acid  2 mg Oral Daily  . furosemide  20 mg Intravenous BID  . gabapentin  100 mg Oral TID  . hydroxyurea  1,000 mg Oral QODAY  . mouth rinse  15 mL Mouth Rinse BID  . morphine  30 mg Oral Q12H  . sodium chloride flush  10-40 mL Intracatheter Q12H  . warfarin  5 mg Oral ONCE-1800  . Warfarin - Pharmacist Dosing Inpatient   Does not apply q1800     LOS: 5 days   [name], MD [facility] Office  [phone] Pager - Text Page per Amion as per below:  On-Call/Text Page:      [name]      password [email or web address]  If 7PM-7AM, please contact night-coverage [email or web address] Password [email or web address] 09/14/2016, 1:38 PM

## 2016-09-14 NOTE — Progress Notes (Signed)
ANTICOAGULATION CONSULT NOTE - Initial Consult  Pharmacy Consult for Coumadin Indication: MVR and hx of PE  No Known Allergies  Patient Measurements: Height: 6\' 1"  (185.4 cm) Weight: 197 lb 5 oz (89.5 kg) IBW/kg (Calculated) : 79.9  Vital Signs: Temp: 98.3 F (36.8 C) (08/15 0723) Temp Source: Oral (08/15 0723) BP: 109/59 (08/15 0723) Pulse Rate: 90 (08/15 0346)  Assessment: 36 yo M presents on 8/10 with sepsis. On Coumadin 5mg  daily exc for 4mg  on MWF PTA for h/o PE and MVR, INR now dropped to 2.34 (7.73 on admit) Will restart Coumadin on 8/15. May need to bridge as would expect INR to drop further. Hgb low but stable at 8.1, plts wnl.  Goal of Therapy:  INR 2.5-3.5 Monitor platelets by anticoagulation protocol: Yes   Plan:  Give Coumadin 5mg  PO x 1 Monitor daily INR, CBC, s/s of bleed Consider need to bridge with heparin or enoxaparin?  Elenor Quinones, PharmD, BCPS Clinical Pharmacist Pager [phone] 09/14/2016 7:42 AM

## 2016-09-14 NOTE — Progress Notes (Signed)
Progress Note  Patient Name: [name] Date of Encounter: 09/14/2016  Primary Cardiologist: [facility]   Subjective   No major complaints. He notes some soreness around his sternal incision site but no other issues.   Inpatient Medications    Scheduled Meds: . aspirin  81 mg Oral Daily  . Chlorhexidine Gluconate Cloth  6 each Topical Daily  . folic acid  2 mg Oral Daily  . furosemide  20 mg Intravenous BID  . gabapentin  100 mg Oral TID  . hydroxyurea  1,000 mg Oral QODAY  . mouth rinse  15 mL Mouth Rinse BID  . morphine  30 mg Oral Q12H  . sodium chloride flush  10-40 mL Intracatheter Q12H  . warfarin  5 mg Oral ONCE-1800  . Warfarin - Pharmacist Dosing Inpatient   Does not apply q1800   Continuous Infusions: . vancomycin     PRN Meds: acetaminophen, hydrALAZINE, HYDROmorphone (DILAUDID) injection, HYDROmorphone, oxyCODONE, sodium chloride flush   Vital Signs    Vitals:   09/13/16 2341 09/14/16 0346 09/14/16 0416 09/14/16 0723  BP: 109/75 115/83  (!) 109/59  Pulse: 89 90    Resp: (!) 23 19    Temp: 98.7 F (37.1 C) 98.4 F (36.9 C)  98.3 F (36.8 C)  TempSrc: Oral Oral  Oral  SpO2: 95% 96%    Weight:   197 lb 5 oz (89.5 kg)   Height:        Intake/Output Summary (Last 24 hours) at 09/14/16 0910 Last data filed at 09/14/16 0400  Gross per 24 hour  Intake              300 ml  Output             1550 ml  Net            -1250 ml   Filed Weights   09/13/16 0232 09/13/16 1912 09/14/16 0416  Weight: 199 lb 4.7 oz (90.4 kg) 198 lb 6.6 oz (90 kg) 197 lb 5 oz (89.5 kg)    Telemetry    NSR - Personally Reviewed  ECG    NSR - Personally Reviewed  Physical Exam   GEN: No acute distress.   Neck: No JVD Cardiac: RRR, mechanical valve sounds present Respiratory: faint bibasilar crackles, L>R GI: Soft, nontender, non-distended  MS: 2+ bilateral LEE edema, R>L; No deformity. Neuro:  Nonfocal  Psych: Normal affect   Labs    Chemistry Recent  Labs Lab 08/31/2016 1717 09/10/16 0528  09/12/16 0440 09/12/16 1200 09/13/16 0217  NA 134* 136  < > 135 136 135  K 6.1* 6.2*  < > 3.4* 3.5 3.6  CL 99* 100*  < > 100* 105 103  CO2 12* 22  < > 27 25 25   GLUCOSE 87 105*  < > 107* 108* 105*  BUN 31* 36*  < > 22* 18 14  CREATININE 3.91* 3.72*  < > 2.23* 1.87* 1.68*  CALCIUM 8.9 8.4*  < > 8.2* 7.8* 7.8*  PROT 8.1 7.1  --   --   --  6.2*  ALBUMIN 3.4* 3.2*  --   --   --  2.7*  AST 69* 54*  --   --   --  34  ALT 29 31  --   --   --  29  ALKPHOS 134* 105  --   --   --  92  BILITOT 4.1* 3.1*  --   --   --  2.2*  GFRNONAA 18* 19*  < > 36* 44* 51*  GFRAA 21* 22*  < > 42* 51* 59*  ANIONGAP 23* 14  < > 8 6 7   < > = values in this interval not displayed.   Hematology Recent Labs Lab 09/10/16 0528 09/11/16 0315 09/12/16 0440  WBC 21.4* 16.5* 13.4*  RBC 2.11*  2.13* 2.47* 2.61*  HGB 6.7* 7.6* 8.1*  HCT 20.0* 22.5* 24.4*  MCV 94.8 91.1 93.5  MCH 31.8 30.8 31.0  MCHC 33.5 33.8 33.2  RDW 20.2* 19.8* 20.8*  PLT 358 341 334    Cardiac EnzymesNo results for input(s): TROPONINI in the last 168 hours. No results for input(s): TROPIPOC in the last 168 hours.   BNP Recent Labs Lab 09/19/2016 2053  BNP >4,500.0*     DDimer No results for input(s): DDIMER in the last 168 hours.   Radiology    No results found.  Cardiac Studies   TTE 09/17/16  - Left ventricle: The cavity size was normal. Wall thickness was increased in a pattern of moderate LVH. Systolic function was mildly reduced. The estimated ejection fraction was in the range of 45% to 50%. Diffuse hypokinesis. The study is not technically sufficient to allow evaluation of LV diastolic function. - Ventricular septum: The contour showed diastolic flattening and systolic flattening. - Mitral valve: Mechanical prosthesis. Normal tilting bileaflet motion. No obstruction. Trivial MR. - Left atrium: The atrium was normal in size. - Right ventricle: Massively dilated  with moderate RV systolic dysfunction. - Right atrium: Severely dilated, - Tricuspid valve: Annuloplasty ring in place. Trivial TR. No vegetation. - Pulmonic valve: There was mild regurgitation. - Pulmonary arteries: intact tricuspid annuloplasty ring with trivial TR, mechanical MVR wtihout obtruction. No evidence for endocarditis.  (S). - Inferior vena cava: The vessel was dilated. The respirophasic diameter changes were blunted (<50%), consistent with elevated central venous pressure.  Impressions:  - Compared to a prior study in 07/2016, the LVEF has mildly declined. There is moderate RV systolic dysfunction, severe RAE and RVE, intact tricuspid annuloplasty ring with trivial TR, mechanical MVR wtihout obtruction. No evidence for endocarditis.  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 2D echo this admit shows intact tricuspid annuloplasty ring with trivial TR, mechanical MVR wtihout obtruction. No evidence for endocarditis.  2. Mild Systolic HF w/ acute exacerbation: compared to prior echo 07/2016, his LVEF has mildly declined, now at 45-50%. This is likely nonischemic. He was admitted with volume overload and markedly abnormal BNP at >4,500. He was started on IV Lasix, 20 mg IV BID. Good UOP. I/Os net negative 3.5 L since admit. SCr is improving with diuresis, down from 2.23>>1.87>>1.68. K has been stable at 3.6. He needs f/u BMP for today. He is still with pulmonic rales and LE pitting edema on exam. Continue diuresis. Continue to monitor renal function closely while diuresising.   3. AKI: Scr significantly elevated from baseline but has been gradually improving with diuresis, down from 2.23>>1.87>>1.68. Urine  in urinal appears dark. Needs f/u BMP today. Lab pending. Continue to monitor.    4. Pulmonary HTN: 2D echo shows elevated PA pressure at 41 mm Hg. Likely the cause of his RV systolic dysfunction. Continue with diuresis.  5. SVT: noted during this admission. Paroxsysmal. Currently NSR. Continue to monitor.   6. Sepsis: per primary team. On antibiotics.   7. Sickle Cell Anemia: per primary.   8. Chronic Anticoagulation: on Coumadin for mechanical MV and well as prior h/o PE. INR is 2.34. Pharmacy following.   Signed, Lyda Jester, PA-C  09/14/2016, 9:10 AM    The patient was seen, examined and discussed with Brittainy M. Rosita Fire, PA-C and I agree with the above.   The patient diuresed well after we added lasix 20 mg IV BID, - 1.2 L, down to 196 lbs, Crea continues to improve 3.9->1.68 (baseline 1.0), BPimproved , continue diuresis at the same rate, he is still fluid overloaded. Telemetry with SR.  Ena Dawley, MD 09/14/2016

## 2016-09-14 NOTE — Discharge Instructions (Signed)
Information on my medicine - Coumadin   (Warfarin)  Why was Coumadin prescribed for you? Coumadin was prescribed for you because you have a blood clot or a medical condition that can cause an increased risk of forming blood clots. Blood clots can cause serious health problems by blocking the flow of blood to the heart, lung, or brain. Coumadin can prevent harmful blood clots from forming. As a reminder your indication for Coumadin is:   Blood Clot Prevention After Heart Valve Surgery  What test will check on my response to Coumadin? While on Coumadin (warfarin) you will need to have an INR test regularly to ensure that your dose is keeping you in the desired range. The INR (international normalized ratio) number is calculated from the result of the laboratory test called prothrombin time (PT).  If an INR APPOINTMENT HAS NOT ALREADY BEEN MADE FOR YOU please schedule an appointment to have this lab work done by your health care provider within 7 days. Your INR goal is usually a number between:  2 to 3 or your provider may give you a more narrow range like 2-2.5.  Ask your health care provider during an office visit what your goal INR is.  What  do you need to  know  About  COUMADIN? Take Coumadin (warfarin) exactly as prescribed by your healthcare provider about the same time each day.  DO NOT stop taking without talking to the doctor who prescribed the medication.  Stopping without other blood clot prevention medication to take the place of Coumadin may increase your risk of developing a new clot or stroke.  Get refills before you run out.  What do you do if you miss a dose? If you miss a dose, take it as soon as you remember on the same day then continue your regularly scheduled regimen the next day.  Do not take two doses of Coumadin at the same time.  Important Safety Information A possible side effect of Coumadin (Warfarin) is an increased risk of bleeding. You should call your healthcare  provider right away if you experience any of the following: ? Bleeding from an injury or your nose that does not stop. ? Unusual colored urine (red or dark brown) or unusual colored stools (red or black). ? Unusual bruising for unknown reasons. ? A serious fall or if you hit your head (even if there is no bleeding).  Some foods or medicines interact with Coumadin (warfarin) and might alter your response to warfarin. To help avoid this: ? Eat a balanced diet, maintaining a consistent amount of Vitamin K. ? Notify your provider about major diet changes you plan to make. ? Avoid alcohol or limit your intake to 1 drink for women and 2 drinks for men per day. (1 drink is 5 oz. wine, 12 oz. beer, or 1.5 oz. liquor.)  Make sure that ANY health care provider who prescribes medication for you knows that you are taking Coumadin (warfarin).  Also make sure the healthcare provider who is monitoring your Coumadin knows when you have started a new medication including herbals and non-prescription products.  Coumadin (Warfarin)  Major Drug Interactions  Increased Warfarin Effect Decreased Warfarin Effect  Alcohol (large quantities) Antibiotics (esp. Septra/Bactrim, Flagyl, Cipro) Amiodarone (Cordarone) Aspirin (ASA) Cimetidine (Tagamet) Megestrol (Megace) NSAIDs (ibuprofen, naproxen, etc.) Piroxicam (Feldene) Propafenone (Rythmol SR) Propranolol (Inderal) Isoniazid (INH) Posaconazole (Noxafil) Barbiturates (Phenobarbital) Carbamazepine (Tegretol) Chlordiazepoxide (Librium) Cholestyramine (Questran) Griseofulvin Oral Contraceptives Rifampin Sucralfate (Carafate) Vitamin K   Coumadin (Warfarin) Major   Herbal Interactions  Increased Warfarin Effect Decreased Warfarin Effect  Garlic Ginseng Ginkgo biloba Coenzyme Q10 Green tea St. [name]'s wort    Coumadin (Warfarin) FOOD Interactions  Eat a consistent number of servings per week of foods HIGH in Vitamin K (1 serving =  cup)  Collards  (cooked, or boiled & drained) Kale (cooked, or boiled & drained) Mustard greens (cooked, or boiled & drained) Parsley *serving size only =  cup Spinach (cooked, or boiled & drained) Swiss chard (cooked, or boiled & drained) Turnip greens (cooked, or boiled & drained)  Eat a consistent number of servings per week of foods MEDIUM-HIGH in Vitamin K (1 serving = 1 cup)  Asparagus (cooked, or boiled & drained) Broccoli (cooked, boiled & drained, or raw & chopped) Brussel sprouts (cooked, or boiled & drained) *serving size only =  cup Lettuce, raw (green leaf, endive, romaine) Spinach, raw Turnip greens, raw & chopped   These websites have more information on Coumadin (warfarin):  www.coumadin.com; www.ahrq.gov/consumer/coumadin.htm;    

## 2016-09-14 NOTE — Care Management Note (Addendum)
Case Management Note  Patient Details  Name: [name] MRN: [id number] Date of Birth: 01-03-1981  Subjective/Objective:   Pt admitted on 09/15/2016 with sepsis and respiratory distress.  Pt with hx of endocarditis; recent MVR done at [facility] in July.  Per pt, he is on long term IV antibiotics at home, but he does not remember the name of his home health provider.  He lives at home with his mother, who he states is doing well with IV infusions.  Pt has home oxygen, provided by [facility].                 Action/Plan: Attempted to reach patient's mother by phone, without success.  Will continue to try to reach mother in attempt to find name of [facility] provider.  Pt states he would like to see if he can get a hospital bed for home, as he can elevate his legs.  Will follow up.    Expected Discharge Date:                  Expected Discharge Plan:  [place]  In-House Referral:     Discharge planning Services  CM Consult  Post Acute Care Choice:    Choice offered to:     DME Arranged:    DME Agency:     HH Arranged:    [place] Agency:     Status of Service:  In process, will continue to follow  If discussed at Long Length of Stay Meetings, dates discussed:    Additional Comments:  Reinaldo Raddle, RN, BSN  Trauma/Neuro ICU Case Manager [phone]

## 2016-09-14 NOTE — Care Management Note (Signed)
Case Management Note  Patient Details  Name: [name] MRN: [id number] Date of Birth: 06/12/1980  Subjective/Objective:   Pt admitted on 09/03/2016 with sepsis and respiratory distress.  Pt with hx of endocarditis; recent MVR done at [facility] in July.  Per pt, he is on long term IV antibiotics at home, but he does not remember the name of his home health provider.  He lives at home with his mother, who he states is doing well with IV infusions.  Pt has home oxygen, provided by [facility]                  Action/Plan: Attempted to reach patient's mother by phone, without success.  Will continue to try to reach mother in attempt to find name of [facility] provider.  Pt states he would like to see if he can get a hospital bed for home, as he can elevate his legs.  Will follow up  Expected Discharge Date:                  Expected Discharge Plan:  [place]  In-House Referral:     Discharge planning Services  CM Consult  Post Acute Care Choice:  Resumption of Svcs/PTA Provider Choice offered to:  Parent  DME Arranged:    DME Agency:     HH Arranged:  RN [place] Agency:     Status of Service:  In process, will continue to follow  If discussed at Long Length of Stay Meetings, dates discussed:    Additional Comments:  8/15 [place], BSN - spoke with patient's mom,  she states they have a hospital bed with [facility] and they need a new mattress, she states [facility] is doing the infusion [phone] . NCM called and spoke with Romelle Starcher she states yes they are working with patient for home infusion, their fax number is [phone], when ready she will need scripts, d/c summary, demographics, recent labs, and if there are any changes in his picc  Line.    Zenon Mayo, RN 09/14/2016, 4:41 PM

## 2016-09-15 ENCOUNTER — Inpatient Hospital Stay (HOSPITAL_COMMUNITY): Payer: Medicare Other

## 2016-09-15 DIAGNOSIS — G894 Chronic pain syndrome: Secondary | ICD-10-CM

## 2016-09-15 DIAGNOSIS — I5043 Acute on chronic combined systolic (congestive) and diastolic (congestive) heart failure: Secondary | ICD-10-CM | POA: Diagnosis not present

## 2016-09-15 DIAGNOSIS — I509 Heart failure, unspecified: Secondary | ICD-10-CM | POA: Diagnosis not present

## 2016-09-15 DIAGNOSIS — E872 Acidosis: Secondary | ICD-10-CM | POA: Diagnosis not present

## 2016-09-15 DIAGNOSIS — R7881 Bacteremia: Secondary | ICD-10-CM

## 2016-09-15 DIAGNOSIS — N179 Acute kidney failure, unspecified: Secondary | ICD-10-CM | POA: Diagnosis not present

## 2016-09-15 DIAGNOSIS — I5041 Acute combined systolic (congestive) and diastolic (congestive) heart failure: Secondary | ICD-10-CM | POA: Diagnosis not present

## 2016-09-15 DIAGNOSIS — I058 Other rheumatic mitral valve diseases: Secondary | ICD-10-CM

## 2016-09-15 DIAGNOSIS — I5021 Acute systolic (congestive) heart failure: Secondary | ICD-10-CM | POA: Diagnosis not present

## 2016-09-15 DIAGNOSIS — J9621 Acute and chronic respiratory failure with hypoxia: Secondary | ICD-10-CM | POA: Diagnosis not present

## 2016-09-15 DIAGNOSIS — D571 Sickle-cell disease without crisis: Secondary | ICD-10-CM | POA: Diagnosis not present

## 2016-09-15 DIAGNOSIS — A4102 Sepsis due to Methicillin resistant Staphylococcus aureus: Secondary | ICD-10-CM | POA: Diagnosis not present

## 2016-09-15 DIAGNOSIS — I272 Pulmonary hypertension, unspecified: Secondary | ICD-10-CM | POA: Diagnosis not present

## 2016-09-15 DIAGNOSIS — D6832 Hemorrhagic disorder due to extrinsic circulating anticoagulants: Secondary | ICD-10-CM | POA: Diagnosis not present

## 2016-09-15 DIAGNOSIS — I2699 Other pulmonary embolism without acute cor pulmonale: Secondary | ICD-10-CM | POA: Diagnosis not present

## 2016-09-15 LAB — CBC
HCT: 26 % — ABNORMAL LOW (ref 39.0–52.0)
HEMOGLOBIN: 8.4 g/dL — AB (ref 13.0–17.0)
MCH: 30.4 pg (ref 26.0–34.0)
MCHC: 32.3 g/dL (ref 30.0–36.0)
MCV: 94.2 fL (ref 78.0–100.0)
Platelets: 298 10*3/uL (ref 150–400)
RBC: 2.76 MIL/uL — ABNORMAL LOW (ref 4.22–5.81)
RDW: 20.5 % — AB (ref 11.5–15.5)
WBC: 12.2 10*3/uL — ABNORMAL HIGH (ref 4.0–10.5)

## 2016-09-15 LAB — COMPREHENSIVE METABOLIC PANEL
ALBUMIN: 3 g/dL — AB (ref 3.5–5.0)
ALK PHOS: 108 U/L (ref 38–126)
ALT: 24 U/L (ref 17–63)
ANION GAP: 7 (ref 5–15)
AST: 28 U/L (ref 15–41)
BILIRUBIN TOTAL: 2.4 mg/dL — AB (ref 0.3–1.2)
BUN: 11 mg/dL (ref 6–20)
CALCIUM: 8.4 mg/dL — AB (ref 8.9–10.3)
CO2: 24 mmol/L (ref 22–32)
Chloride: 105 mmol/L (ref 101–111)
Creatinine, Ser: 1.29 mg/dL — ABNORMAL HIGH (ref 0.61–1.24)
GFR calc Af Amer: >60 mL/min (ref >60)
GFR calc non Af Amer: >60 mL/min (ref >60)
GLUCOSE: 114 mg/dL — AB (ref 65–99)
Potassium: 3.7 mmol/L (ref 3.5–5.1)
SODIUM: 136 mmol/L (ref 135–145)
TOTAL PROTEIN: 6.9 g/dL (ref 6.5–8.1)

## 2016-09-15 LAB — PROTIME-INR
INR: 1.84
Prothrombin Time: 21.5 seconds — ABNORMAL HIGH (ref 11.4–15.2)

## 2016-09-15 LAB — CULTURE, BLOOD (ROUTINE X 2)
Culture: NO GROWTH
Special Requests: ADEQUATE

## 2016-09-15 MED ORDER — WARFARIN SODIUM 5 MG PO TABS
5.0000 mg | ORAL_TABLET | Freq: Once | ORAL | Status: AC
  Administered 2016-09-15: 5 mg via ORAL
  Filled 2016-09-15: qty 1

## 2016-09-15 MED ORDER — ALBUMIN HUMAN 25 % IV SOLN
50.0000 g | Freq: Once | INTRAVENOUS | Status: AC
  Administered 2016-09-16: 50 g via INTRAVENOUS
  Filled 2016-09-15: qty 200

## 2016-09-15 NOTE — Progress Notes (Signed)
Progress Note  Patient Name: [name] Date of Encounter: 09/15/2016  Primary Cardiologist: [facility]   Subjective   No major complaints. He appears somnolent.   Inpatient Medications    Scheduled Meds: . aspirin  81 mg Oral Daily  . Chlorhexidine Gluconate Cloth  6 each Topical Daily  . folic acid  2 mg Oral Daily  . furosemide  20 mg Intravenous BID  . gabapentin  100 mg Oral TID  . hydroxyurea  1,000 mg Oral QODAY  . mouth rinse  15 mL Mouth Rinse BID  . morphine  30 mg Oral Q12H  . sodium chloride flush  10-40 mL Intracatheter Q12H  . warfarin  5 mg Oral ONCE-1800  . Warfarin - Pharmacist Dosing Inpatient   Does not apply q1800   Continuous Infusions: . vancomycin Stopped (09/15/16 0040)   PRN Meds: acetaminophen, hydrALAZINE, HYDROmorphone (DILAUDID) injection, HYDROmorphone, oxyCODONE, sodium chloride flush   Vital Signs    Vitals:   09/15/16 0000 09/15/16 0010 09/15/16 0343 09/15/16 0400  BP: 99/72 99/72 102/89 105/81  Pulse: 87   92  Resp: 20   17  Temp:  98.5 F (36.9 C) 98.5 F (36.9 C)   TempSrc:  Oral Oral   SpO2: 95%   93%  Weight:   195 lb 15.8 oz (88.9 kg)   Height:        Intake/Output Summary (Last 24 hours) at 09/15/16 0850 Last data filed at 09/15/16 0834  Gross per 24 hour  Intake              993 ml  Output             2125 ml  Net            -1132 ml   Filed Weights   09/13/16 1912 09/14/16 0416 09/15/16 0343  Weight: 198 lb 6.6 oz (90 kg) 197 lb 5 oz (89.5 kg) 195 lb 15.8 oz (88.9 kg)    Telemetry    NSR - Personally Reviewed  ECG    NSR - Personally Reviewed  Physical Exam   GEN: No acute distress.   Neck: No JVD Cardiac: RRR, mechanical valve sounds present Respiratory: faint bibasilar crackles, L>R GI: Soft, nontender, non-distended  MS: 2+ bilateral LEE edema, R>L; No deformity. Neuro:  Nonfocal  Psych: Normal affect   Labs    Chemistry Recent Labs Lab 09/10/16 0528  09/12/16 1200 09/13/16 0217  09/15/16 0250  NA 136  < > 136 135 136  K 6.2*  < > 3.5 3.6 3.7  CL 100*  < > 105 103 105  CO2 22  < > 25 25 24   GLUCOSE 105*  < > 108* 105* 114*  BUN 36*  < > 18 14 11   CREATININE 3.72*  < > 1.87* 1.68* 1.29*  CALCIUM 8.4*  < > 7.8* 7.8* 8.4*  PROT 7.1  --   --  6.2* 6.9  ALBUMIN 3.2*  --   --  2.7* 3.0*  AST 54*  --   --  34 28  ALT 31  --   --  29 24  ALKPHOS 105  --   --  92 108  BILITOT 3.1*  --   --  2.2* 2.4*  GFRNONAA 19*  < > 44* 51* >60  GFRAA 22*  < > 51* 59* >60  ANIONGAP 14  < > 6 7 7   < > = values in this interval not displayed.  Hematology  Recent Labs Lab 09/11/16 0315 09/12/16 0440 09/15/16 0250  WBC 16.5* 13.4* 12.2*  RBC 2.47* 2.61* 2.76*  HGB 7.6* 8.1* 8.4*  HCT 22.5* 24.4* 26.0*  MCV 91.1 93.5 94.2  MCH 30.8 31.0 30.4  MCHC 33.8 33.2 32.3  RDW 19.8* 20.8* 20.5*  PLT 341 334 298    Cardiac EnzymesNo results for input(s): TROPONINI in the last 168 hours. No results for input(s): TROPIPOC in the last 168 hours.   BNP  Recent Labs Lab 09/18/2016 2053  BNP >4,500.0*     DDimer No results for input(s): DDIMER in the last 168 hours.   Radiology    No results found.  Cardiac Studies   TTE 09/17/16  - Left ventricle: The cavity size was normal. Wall thickness was increased in a pattern of moderate LVH. Systolic function was mildly reduced. The estimated ejection fraction was in the range of 45% to 50%. Diffuse hypokinesis. The study is not technically sufficient to allow evaluation of LV diastolic function. - Ventricular septum: The contour showed diastolic flattening and systolic flattening. - Mitral valve: Mechanical prosthesis. Normal tilting bileaflet motion. No obstruction. Trivial MR. - Left atrium: The atrium was normal in size. - Right ventricle: Massively dilated with moderate RV systolic dysfunction. - Right atrium: Severely dilated, - Tricuspid valve: Annuloplasty ring in place. Trivial TR.  No vegetation. - Pulmonic valve: There was mild regurgitation. - Pulmonary arteries: intact tricuspid annuloplasty ring with trivial TR, mechanical MVR wtihout obtruction. No evidence for endocarditis.  (S). - Inferior vena cava: The vessel was dilated. The respirophasic diameter changes were blunted (<50%), consistent with elevated central venous pressure.  Impressions:  - Compared to a prior study in 07/2016, the LVEF has mildly declined. There is moderate RV systolic dysfunction, severe RAE and RVE, intact tricuspid annuloplasty ring with trivial TR, mechanical MVR wtihout obtruction. No evidence for endocarditis.  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 2D echo this admit shows intact tricuspid annuloplasty ring with trivial TR, mechanical MVR wtihout obtruction. No evidence for endocarditis.  2. Mild Systolic HF w/ acute exacerbation: compared to prior echo 07/2016, his LVEF has mildly declined, now at 45-50%. This is likely nonischemic. He was admitted with volume overload and markedly abnormal BNP at >4,500. He was started on IV Lasix, good response, now below baseline weight of 197 lbs, still fluid overloaded, good response to diuretics, I would continue the same dose, crea improving, K stable.   3. AKI: Crea 3.7--> 1.29 with diuresis    4. Pulmonary HTN: 2D echo shows elevated PA pressure at 41 mm Hg. Likely the cause of his RV systolic dysfunction. Continue with diuresis.   5. SVT: noted during this admission. Paroxsysmal. Currently NSR. Continue to monitor.   6. Sepsis: per primary team. On antibiotics.   7. Sickle Cell Anemia: per primary.   8. Chronic Anticoagulation: on Coumadin for mechanical  MV and well as prior h/o PE. INR is 2.34. Pharmacy following.   Ena Dawley, MD 09/15/2016

## 2016-09-15 NOTE — Progress Notes (Signed)
CCMD called and made me aware that patient sustaining heart rate in 130s. Notified Dr. Sherral Hammers. No new orders.

## 2016-09-15 NOTE — Progress Notes (Signed)
MD woods paged to make aware that pt requiring more O2, now on 6L and had an episode of increasing tachycardia with ventricular bigeminy. Heart rate now in the 90's. Pt did not complain of SOB during this.   Pt is complaining of pain frequently and calling for pain medication often even when he appears very drowsy, pt will call for medication and be asleep when the nurse comes to the room.

## 2016-09-15 NOTE — Progress Notes (Signed)
PT Cancellation Note  Patient Details Name: [name] MRN: [id number] DOB: August 09, 1980   Cancelled Treatment:    Reason Eval/Treat Not Completed: Patient declined, no reason specified (come back later.) Pt declined in AM and PM.   [place] 09/15/2016, 3:14 PM [facility] PT [phone]

## 2016-09-15 NOTE — Progress Notes (Signed)
PROGRESS NOTE    [name]  ZJI:[id number] DOB: Aug 25, 1980 DOA: 09/25/2016 PCP: Tresa Garter, MD   Brief Narrative:  36 y.o. BM PMHx Sickle Cell Anemia Pulmonary HTN, PE, MRSA bacteremia in July with blood cultures done at [facility]. Patient was evaluated by ID that recommended removal of port. The patient remained in place until transfer to do. He was ultimately found to have a large vegetation measuring at 2.6 cm in maximal dimension on transesophageal echocardiogram on his mitral valve with a possible PFO. Patient is followed by post hematology and pulmonology at [facility] and therefore transferred to [facility] was felt to be most appropriate. Patient underwent mechanical mitral valve replacement at [facility] as well as tricuspid valve ring on 7/17 as per his electronic medical record.   In the emergency department the patient was started on noninvasive positive pressure ventilation for tenuous respiratory status. Lactic acidosis was noted and a bicarbonate drip was initiated. Empiric Vancomycin and Zosyn was administered. Lasix was given initially IV as well as a Nitroglycerin infusion.The patient's complex medical history PCCM was consulted. The patient reports that he has had increasing lower extremity edema for approximately 1 week. Denies any chest pain, pressure, or tightness. He has experienced increased shortness of breath particularly over the last 1-2 days with associated fever in that period of time. Denies any purulent cough. Reports only minimal back discomfort and denies any joint pain, swelling, or erythema. He reports he remotely did have acute chest syndrome but has never been endotracheally intubated. Reports he has been compliant with his antibiotic and anticoagulant regimen. He reports he has had increased thirst lately but denies any increased sodium consumption. No recent sick contacts or  travel.    Subjective: 8/16  A/O 4, positive increased WOB, positive increased SOB, positive CP (pulmonary edema/pulmonary congestion), positive increased pedal edema, negative abdominal pain, negative N/V. Patient states was chronically on 3 L O2 sedentary--> 4 L O2 when active.    Assessment & Plan:   Active Problems:   Acute on chronic respiratory failure with hypoxia (HCC)   Acute on chronic respiratory failure with hypoxia  -Secondary to worsening CHF, patient's EF post surgery has decreased from 60-65% ---> 45-50%, massively dilated right ventricle see echocardiogram results below   Acute systolic CHF nonischemic -Strict in and out +8.8 L -Daily weight -Patient's BP will not tolerate beta blocker,ACEI/ARB, or increased narcotics/benzodiazepine -Albumin 50 g 1 (hopefully will increase ability to diuresis, given patient's low protein level) -Lasix 20 mg BID  Pulmonary HTN -See CHF   Tricuspid and Mitral vavle endocarditis  -S/p mechanical mitral valve and tricuspid ring valve at [facility] July 2018 -Valves functioning properly per TTE per Cards service   Hx PE -Fully anticoagulated w/ warfarin   Recent Labs Lab 09/10/16 0528 09/11/16 0315 09/12/16 0440 09/13/16 0217 09/14/16 0427 09/15/16 0250  INR 5.83* 5.05* 4.83* 3.58 2.34 1.84  -INR goal 2.5 -3.5 -Subtherapeutic  positive STAPHYLOCOCCUS EPIDERMIDIS oxacillin resistant Bacteremia -On previous admission in July, patient has been on vancomycin since July, and has had valves replaced at [place] with Dr. Carlyle Basques ID on 8/16, stated patient only needed to be on 6 weeks of Vancomycin (will verify with pharmacy exact start date). Patient Can come off of isolation. Obtain blood culture after patient has been off vancomycin   Acute renal failure  Recent Labs Lab 09/11/16 0315 09/11/16 1748 09/12/16 0440 09/12/16 1200 09/13/16 0217 09/15/16 0250  CREATININE 3.21* 2.78* 2.23* 1.87* 1.68* 1.29*   -Resolving   Sickle Cell Anemia  -Currently stable  Chronic pain  -On chronic high dose narcotics  -Patient request additional narcotics: See CHF    DVT prophylaxis: Warfarin per pharmacy Code Status: Full Family Communication: Spoke with family over the phone Disposition Plan: Discuss plan of care. TBD   Consultants:  Cardiology ID phone consult Dr. Carlyle Basques    Procedures/Significant Events:  07/06 > 08/14/16 - Admitted for Acute Bacterial Endocarditis of Tricuspid and Mitral Valves & Pulmonary Embolism 07/17 - Mechanical Mitral Valve Replacement and Tricuspid Valve Ring at [facility] 08/10 - admit to [facility]  8/10 Echocardiogram: LVEF= 45 to 50%, diffuse hypokinesis -Mitral valve: Mechanical prosthesis -Right ventricle massively dilated with moderate RV systolic dysfunction -Right atrium severely: dilated -Pulmonary arteries: PA peak pressure 41 mmHg   I have personally reviewed and interpreted all radiology studies and my findings are as above.  VENTILATOR SETTINGS: None   Cultures 7/6 blood positive STAPHYLOCOCCUS EPIDERMIDIS oxacillin resistant 8/10 blood NGTD       Antimicrobials: Anti-infectives    Start     Stop   09/14/16 2130  vancomycin (VANCOCIN) 1,500 mg in sodium chloride 0.9 % 500 mL IVPB         09/13/16 2115  vancomycin (VANCOCIN) 500 mg in sodium chloride 0.9 % 100 mL IVPB     09/13/16 2317   09/10/16 2200  vancomycin (VANCOCIN) IVPB 1000 mg/200 mL premix  Status:  Discontinued     09/10/16 1837   09/10/16 1900  piperacillin-tazobactam (ZOSYN) IVPB 3.375 g  Status:  Discontinued     09/11/16 0910   09/10/16 1900  vancomycin (VANCOCIN) IVPB 1000 mg/200 mL premix  Status:  Discontinued     09/13/16 2104   09/10/16 0200  piperacillin-tazobactam (ZOSYN) IVPB 3.375 g  Status:  Discontinued     09/10/16 1837   09/08/2016 1745  piperacillin-tazobactam (ZOSYN) IVPB 3.375 g     09/17/2016 1815   09/11/2016 1745  vancomycin  (VANCOCIN) IVPB 1000 mg/200 mL premix  Status:  Discontinued     09/21/2016 1740   09/18/2016 1745  vancomycin (VANCOCIN) 2,000 mg in sodium chloride 0.9 % 500 mL IVPB     09/15/2016 2020       Devices None   LINES / TUBES:  None    Continuous Infusions: . vancomycin Stopped (09/15/16 0040)     Objective: Vitals:   09/15/16 0000 09/15/16 0010 09/15/16 0343 09/15/16 0400  BP: 99/72 99/72 102/89 105/81  Pulse: 87   92  Resp: 20   17  Temp:  98.5 F (36.9 C) 98.5 F (36.9 C)   TempSrc:  Oral Oral   SpO2: 95%   93%  Weight:   195 lb 15.8 oz (88.9 kg)   Height:        Intake/Output Summary (Last 24 hours) at 09/15/16 0844 Last data filed at 09/15/16 0834  Gross per 24 hour  Intake              993 ml  Output             2125 ml  Net            -1132 ml   Filed Weights   09/13/16 1912 09/14/16 0416 09/15/16 0343  Weight: 198 lb 6.6 oz (90 kg) 197 lb 5 oz (89.5 kg) 195 lb 15.8 oz (88.9 kg)    Examination:  General: A/O 4,  positive acute on chronic respiratory distress Neck:  Negative scars, masses, torticollis, lymphadenopathy, JVD Lungs: tachypnea diffuse poor air movement, bibasilar crackles, negative wheezing  Cardiovascular: Tachycardia, Regular rhythm without murmur gallop or rub normal S1 and S2 Abdomen: negative abdominal pain, nondistended, positive soft, bowel sounds, no rebound, no ascites, no appreciable mass Extremities: No significant cyanosis, clubbing. Bilateral lower extremity edema 3-4+ to hips,  Skin: Negative rashes, lesions, ulcers Psychiatric:  Negative depression, negative anxiety, negative fatigue, negative mania  Central nervous system:  Cranial nerves II through XII intact, tongue/uvula midline, all extremities muscle strength 5/5, sensation intact throughout, negative dysarthria, negative expressive aphasia, negative receptive aphasia.  .     Data Reviewed: Care during the described time interval was provided by me .  I have reviewed this  patient's available data, including medical history, events of note, physical examination, and all test results as part of my evaluation.   CBC:  Recent Labs Lab 09/15/2016 1717 09/10/16 0528 09/11/16 0315 09/12/16 0440 09/15/16 0250  WBC 23.7* 21.4* 16.5* 13.4* 12.2*  NEUTROABS 19.2* 17.1* 13.1* 10.4*  --   HGB 7.6* 6.7* 7.6* 8.1* 8.4*  HCT 23.0* 20.0* 22.5* 24.4* 26.0*  MCV 96.2 94.8 91.1 93.5 94.2  PLT 423* 358 341 334 563   Basic Metabolic Panel:  Recent Labs Lab 08/31/2016 2053 09/10/16 0528  09/11/16 0315 09/11/16 1748 09/12/16 0440 09/12/16 1200 09/13/16 0217 09/15/16 0250  NA  --  136  < > 136 136 135 136 135 136  K  --  6.2*  < > 4.2 3.5 3.4* 3.5 3.6 3.7  CL  --  100*  < > 100* 100* 100* 105 103 105  CO2  --  22  < > 26 26 27 25 25 24   GLUCOSE  --  105*  < > 102* 112* 107* 108* 105* 114*  BUN  --  36*  < > 34* 28* 22* 18 14 11   CREATININE  --  3.72*  < > 3.21* 2.78* 2.23* 1.87* 1.68* 1.29*  CALCIUM  --  8.4*  < > 8.3* 8.1* 8.2* 7.8* 7.8* 8.4*  MG 2.5* 2.5*  --  2.4 2.3 2.2  --   --   --   PHOS  --  6.6*  --  6.2*  --  4.4  --   --   --   < > = values in this interval not displayed. GFR: Estimated Creatinine Clearance: 88.6 mL/min (A) (by C-G formula based on SCr of 1.29 mg/dL (H)). Liver Function Tests:  Recent Labs Lab 09/24/2016 1717 09/10/16 0528 09/13/16 0217 09/15/16 0250  AST 69* 54* 34 28  ALT 29 31 29 24   ALKPHOS 134* 105 92 108  BILITOT 4.1* 3.1* 2.2* 2.4*  PROT 8.1 7.1 6.2* 6.9  ALBUMIN 3.4* 3.2* 2.7* 3.0*   No results for input(s): LIPASE, AMYLASE in the last 168 hours. No results for input(s): AMMONIA in the last 168 hours. Coagulation Profile:  Recent Labs Lab 09/11/16 0315 09/12/16 0440 09/13/16 0217 09/14/16 0427 09/15/16 0250  INR 5.05* 4.83* 3.58 2.34 1.84   Cardiac Enzymes: No results for input(s): CKTOTAL, CKMB, CKMBINDEX, TROPONINI in the last 168 hours. BNP (last 3 results) No results for input(s): PROBNP in the last  8760 hours. HbA1C: No results for input(s): HGBA1C in the last 72 hours. CBG: No results for input(s): GLUCAP in the last 168 hours. Lipid Profile: No results for input(s): CHOL, HDL, LDLCALC, TRIG, CHOLHDL, LDLDIRECT in the last 72 hours.  Thyroid Function Tests: No results for input(s): TSH, T4TOTAL, FREET4, T3FREE, THYROIDAB in the last 72 hours. Anemia Panel: No results for input(s): VITAMINB12, FOLATE, FERRITIN, TIBC, IRON, RETICCTPCT in the last 72 hours. Urine analysis:    Component Value Date/Time   COLORURINE YELLOW 09/21/2016 2010   APPEARANCEUR HAZY (A) 08/31/2016 2010   LABSPEC 1.010 09/28/2016 2010   PHURINE 6.0 09/04/2016 2010   GLUCOSEU NEGATIVE 09/21/2016 2010   HGBUR SMALL (A) 09/03/2016 2010   [place] NEGATIVE 09/18/2016 2010   [place] NEGATIVE 09/11/2016 2010   PROTEINUR 30 (A) 08/31/2016 2010   UROBILINOGEN 0.2 03/24/2015 1426   NITRITE NEGATIVE 09/25/2016 2010   LEUKOCYTESUR TRACE (A) 09/07/2016 2010   Sepsis Labs: @LABRCNTIP (procalcitonin:4,lacticidven:4)  ) Recent Results (from the past 240 hour(s))  Culture, blood (Routine x 2)     Status: None   Collection Time: 09/03/2016  5:30 PM  Result Value Ref Range Status   Specimen Description BLOOD LEFT ARM  Final   Special Requests   Final    BOTTLES DRAWN AEROBIC AND ANAEROBIC Blood Culture adequate volume   Culture NO GROWTH 5 DAYS  Final   Report Status 09/14/2016 FINAL  Final  Culture, blood (Routine x 2)     Status: None (Preliminary result)   Collection Time: 09/24/2016  8:40 PM  Result Value Ref Range Status   Specimen Description BLOOD RIGHT HAND  Final   Special Requests IN PEDIATRIC BOTTLE Blood Culture adequate volume  Final   Culture NO GROWTH 4 DAYS  Final   Report Status PENDING  Incomplete  MRSA PCR Screening     Status: None   Collection Time: 09/13/16 11:11 PM  Result Value Ref Range Status   MRSA by PCR NEGATIVE NEGATIVE Final    Comment:        The GeneXpert MRSA Assay  (FDA approved for NASAL specimens only), is one component of a comprehensive MRSA colonization surveillance program. It is not intended to diagnose MRSA infection nor to guide or monitor treatment for MRSA infections.          Radiology Studies: No results found.      Scheduled Meds: . aspirin  81 mg Oral Daily  . Chlorhexidine Gluconate Cloth  6 each Topical Daily  . folic acid  2 mg Oral Daily  . furosemide  20 mg Intravenous BID  . gabapentin  100 mg Oral TID  . hydroxyurea  1,000 mg Oral QODAY  . mouth rinse  15 mL Mouth Rinse BID  . morphine  30 mg Oral Q12H  . sodium chloride flush  10-40 mL Intracatheter Q12H  . warfarin  5 mg Oral ONCE-1800  . Warfarin - Pharmacist Dosing Inpatient   Does not apply q1800   Continuous Infusions: . vancomycin Stopped (09/15/16 0040)     LOS: 6 days    Time spent: 40 minutes    Jordanna Hendrie, Geraldo Docker, MD [facility] Pager [phone]   If 7PM-7AM, please contact night-coverage [email or web address] Password [facility] 09/15/2016, 8:44 AM

## 2016-09-15 NOTE — Progress Notes (Signed)
[place] for Coumadin Indication: MVR and hx of PE  No Known Allergies  Patient Measurements: Height: 6\' 1"  (185.4 cm) Weight: 195 lb 15.8 oz (88.9 kg) IBW/kg (Calculated) : 79.9  Vital Signs: Temp: 98.5 F (36.9 C) (08/16 0343) Temp Source: Oral (08/16 0343) BP: 105/81 (08/16 0400) Pulse Rate: 92 (08/16 0400)  Assessment: 36 yo M presents on 8/10 with sepsis. On Coumadin 5mg  daily exc for 4mg  on MWF PTA for h/o PE and MVR, coumadin held on admission due to SUPRA therapeutic INR but resumed on 8/15. INR down 1.84 < 2.38. Not surprising due to number of doses held.   Goal of Therapy:  INR 2.5-3.5 Monitor platelets by anticoagulation protocol: Yes   Plan:  1. Give Coumadin 5mg  PO x 1 this evening  2. Monitor daily INR, CBC, s/s of bleed 3. Consider need to bridge with heparin or enoxaparin?  Vincenza Hews, PharmD, BCPS 09/15/2016, 8:25 AM

## 2016-09-16 DIAGNOSIS — I2699 Other pulmonary embolism without acute cor pulmonale: Secondary | ICD-10-CM | POA: Diagnosis not present

## 2016-09-16 DIAGNOSIS — A4102 Sepsis due to Methicillin resistant Staphylococcus aureus: Secondary | ICD-10-CM | POA: Diagnosis not present

## 2016-09-16 DIAGNOSIS — I5041 Acute combined systolic (congestive) and diastolic (congestive) heart failure: Secondary | ICD-10-CM | POA: Diagnosis not present

## 2016-09-16 DIAGNOSIS — A419 Sepsis, unspecified organism: Secondary | ICD-10-CM

## 2016-09-16 DIAGNOSIS — J9621 Acute and chronic respiratory failure with hypoxia: Secondary | ICD-10-CM | POA: Diagnosis not present

## 2016-09-16 DIAGNOSIS — D6832 Hemorrhagic disorder due to extrinsic circulating anticoagulants: Secondary | ICD-10-CM | POA: Diagnosis not present

## 2016-09-16 DIAGNOSIS — E872 Acidosis: Secondary | ICD-10-CM | POA: Diagnosis not present

## 2016-09-16 LAB — CBC
HCT: 27.9 % — ABNORMAL LOW (ref 39.0–52.0)
Hemoglobin: 8.9 g/dL — ABNORMAL LOW (ref 13.0–17.0)
MCH: 29.9 pg (ref 26.0–34.0)
MCHC: 31.9 g/dL (ref 30.0–36.0)
MCV: 93.6 fL (ref 78.0–100.0)
PLATELETS: 260 10*3/uL (ref 150–400)
RBC: 2.98 MIL/uL — AB (ref 4.22–5.81)
RDW: 20.1 % — AB (ref 11.5–15.5)
WBC: 14 10*3/uL — ABNORMAL HIGH (ref 4.0–10.5)

## 2016-09-16 LAB — BASIC METABOLIC PANEL
Anion gap: 11 (ref 5–15)
BUN: 14 mg/dL (ref 6–20)
CO2: 22 mmol/L (ref 22–32)
CREATININE: 1.48 mg/dL — AB (ref 0.61–1.24)
Calcium: 8.8 mg/dL — ABNORMAL LOW (ref 8.9–10.3)
Chloride: 102 mmol/L (ref 101–111)
GFR calc Af Amer: >60 mL/min (ref >60)
GFR, EST NON AFRICAN AMERICAN: 59 mL/min — AB (ref >60)
GLUCOSE: 124 mg/dL — AB (ref 65–99)
Potassium: 3.8 mmol/L (ref 3.5–5.1)
Sodium: 135 mmol/L (ref 135–145)

## 2016-09-16 LAB — TROPONIN I: Troponin I: 0.06 ng/mL (ref <0.03)

## 2016-09-16 LAB — PROTIME-INR
INR: 1.81
PROTHROMBIN TIME: 21.2 s — AB (ref 11.4–15.2)

## 2016-09-16 LAB — GLUCOSE, CAPILLARY
GLUCOSE-CAPILLARY: 129 mg/dL — AB (ref 65–99)
Glucose-Capillary: 131 mg/dL — ABNORMAL HIGH (ref 65–99)

## 2016-09-16 LAB — MAGNESIUM: Magnesium: 1.7 mg/dL (ref 1.7–2.4)

## 2016-09-16 MED ORDER — WARFARIN SODIUM 5 MG PO TABS: 5.0000 mg | ORAL_TABLET | Freq: Once | ORAL | Status: DC

## 2016-09-16 MED ORDER — METOPROLOL TARTRATE 5 MG/5ML IV SOLN
5.0000 mg | Freq: Once | INTRAVENOUS | Status: AC
Start: 2016-09-16 — End: 2016-09-16
  Administered 2016-09-16: 5 mg via INTRAVENOUS
  Filled 2016-09-16: qty 5

## 2016-09-19 IMAGING — CT CT HIP*R* W/O CM
3 of 4 series · 17 of 46 positions shown, 19 images · non-contrast
Comparison: RIGHT hip radiographs 10/27/2013

CLINICAL DATA: RIGHT hip pain post fall, history sickle cell
disease, prior avascular necrosis RIGHT hip, RIGHT hip replacement

EXAM:
CT OF THE RIGHT HIP WITHOUT CONTRAST
TECHNIQUE: Multidetector CT imaging of the right hip was performed according to
the standard protocol. Multiplanar CT image reconstructions were
also generated.

[Series 2: rt hip bone · axial · 0.49mm/px · z∈[-400,-302]mm · 4 of 107 slices shown]
[im 12/107  bone]
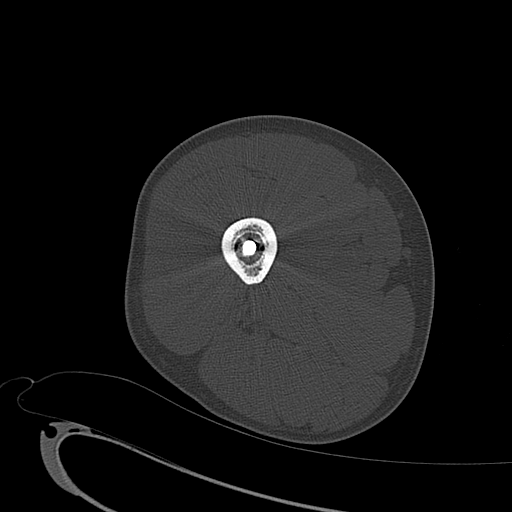
[im 23/107  bone]
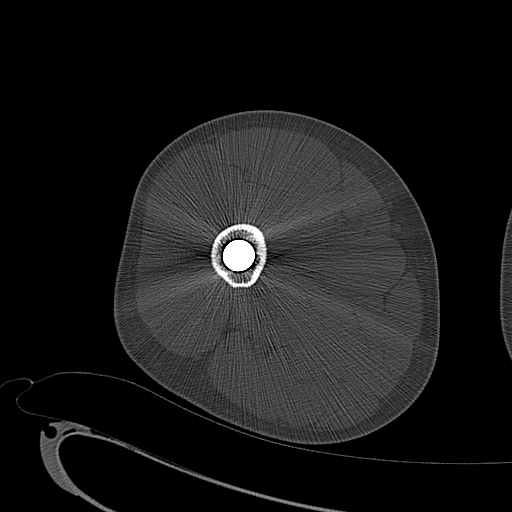
[im 34/107  bone]
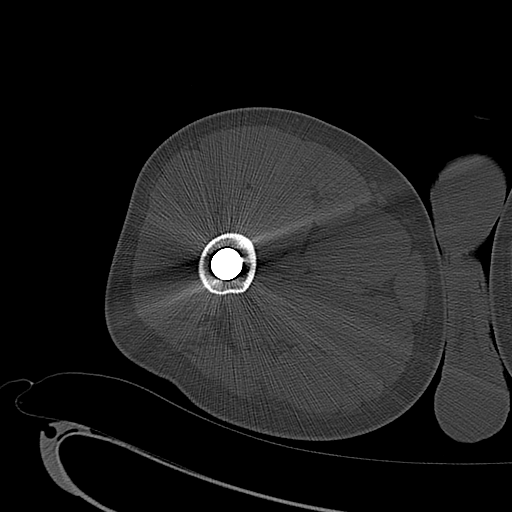
[im 45/107  bone]
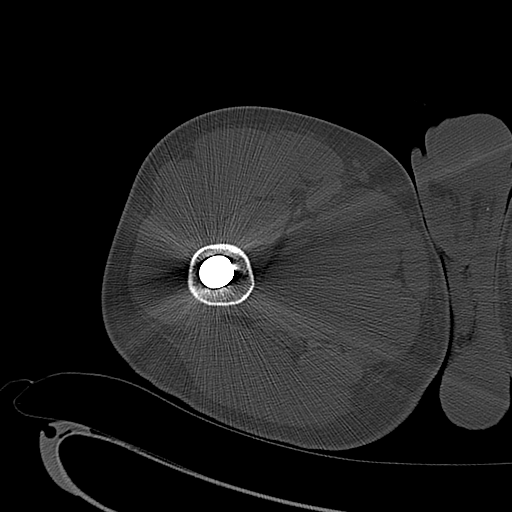

[Series 3: rt hip st · axial · 0.49mm/px · z∈[-410,-146]mm · 10 of 65 slices shown, 12 images]
[im 6/65  soft-tissue]
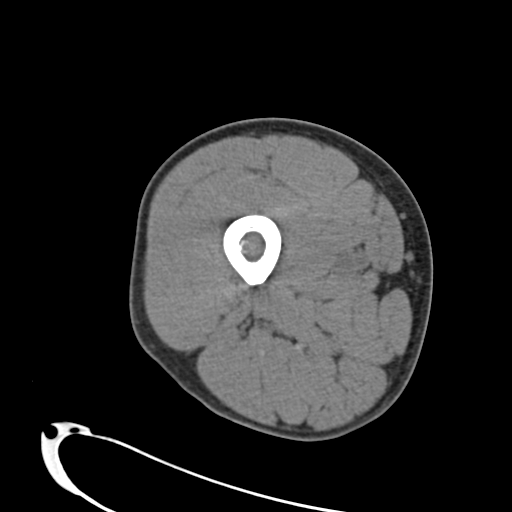
[im 6/65  bone]
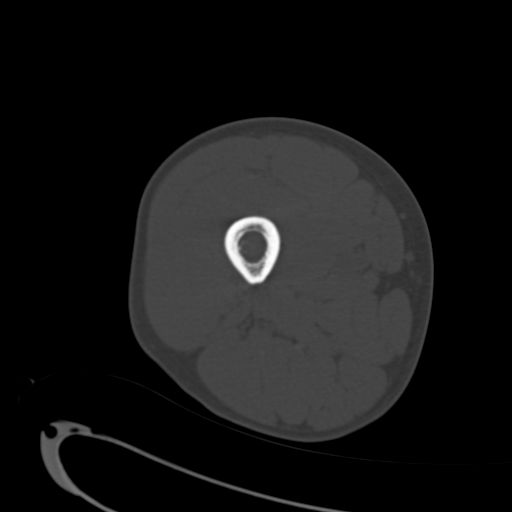
[im 12/65  soft-tissue]
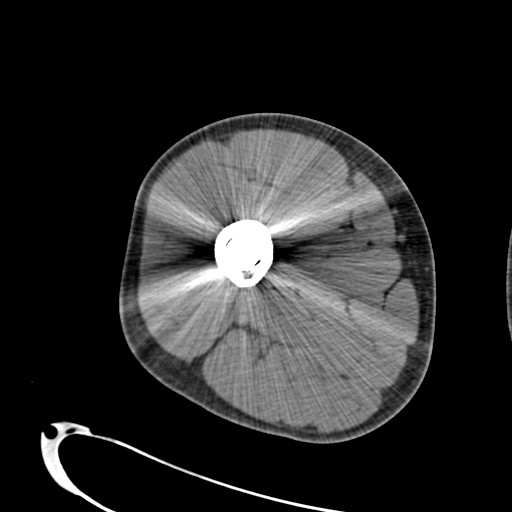
[im 18/65  soft-tissue]
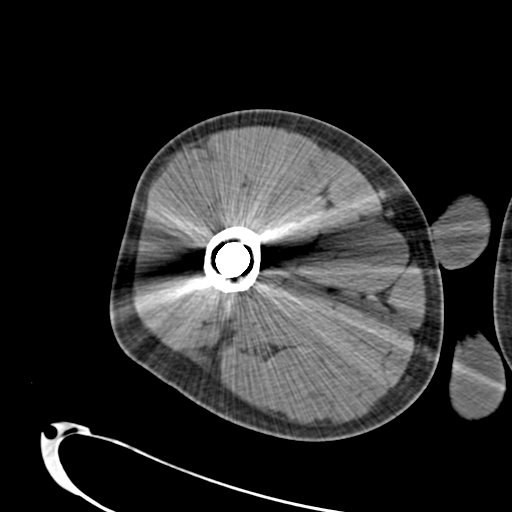
[im 24/65  soft-tissue]
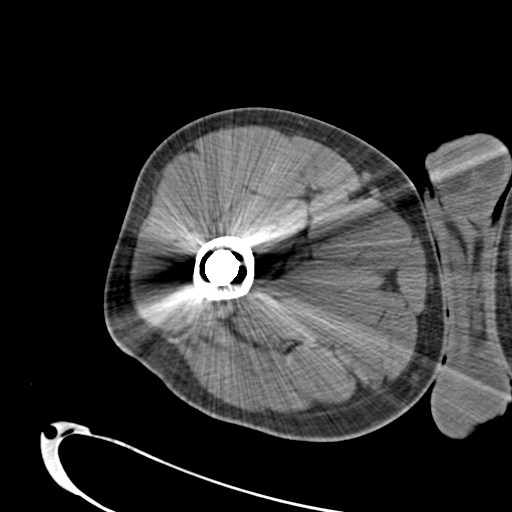
[im 30/65  soft-tissue]
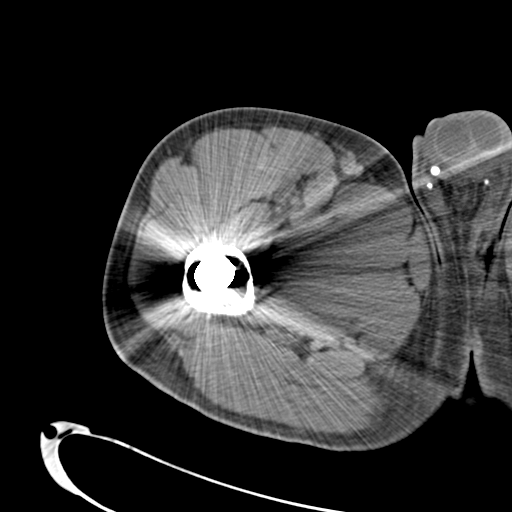
[im 35/65  soft-tissue]
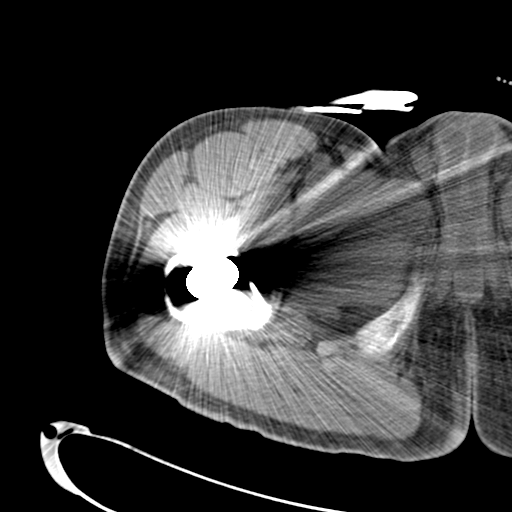
[im 41/65  soft-tissue]
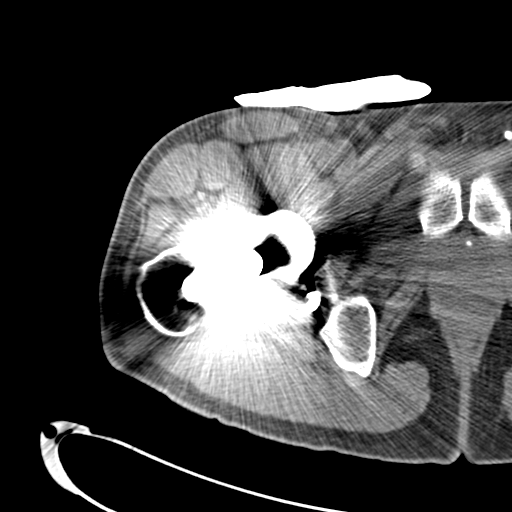
[im 47/65  soft-tissue]
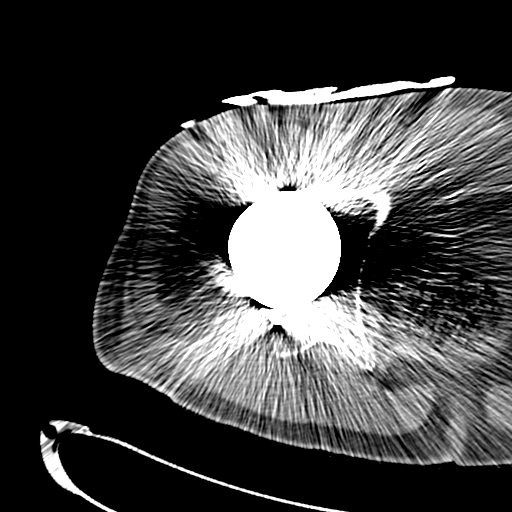
[im 53/65  soft-tissue]
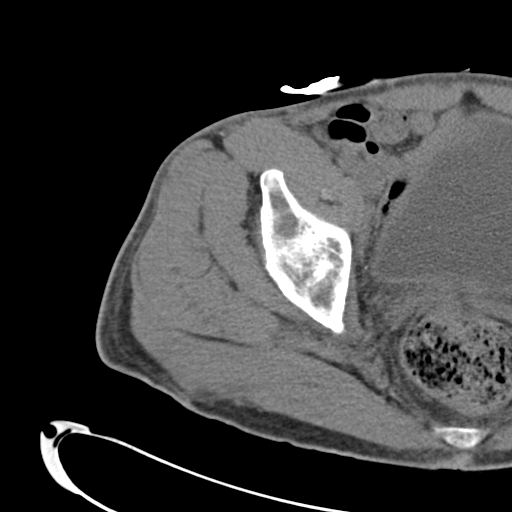
[im 53/65  bone]
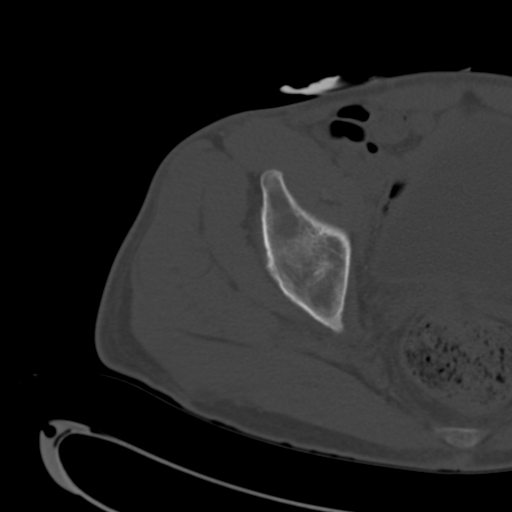
[im 59/65  soft-tissue]
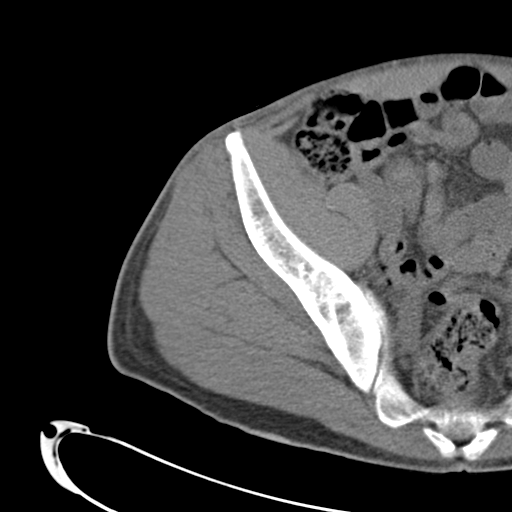

[Series 604: <mpr thick range(1)> · coronal · 0.63mm/px · 3 of 110 slices shown]
[im 37/110  soft-tissue]
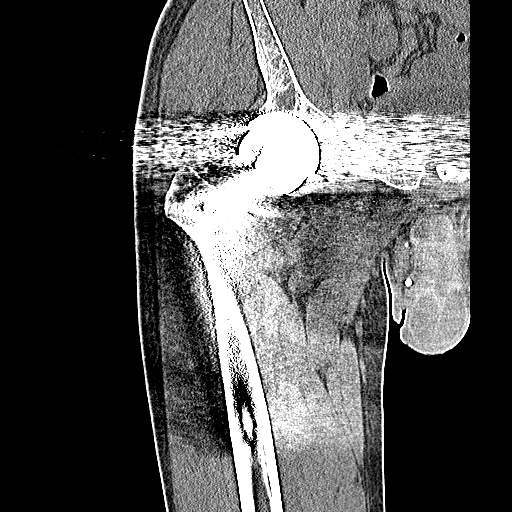
[im 49/110  soft-tissue]
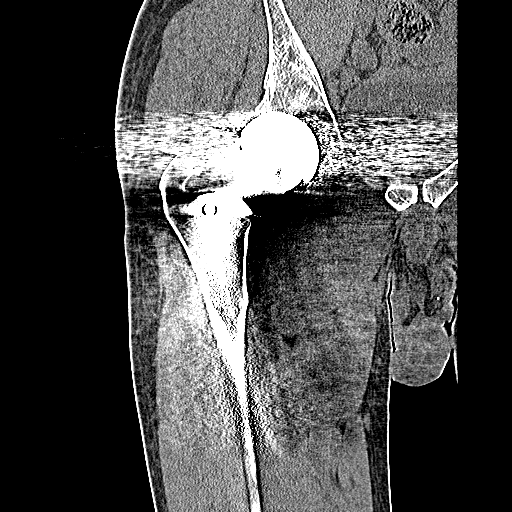
[im 61/110  soft-tissue]
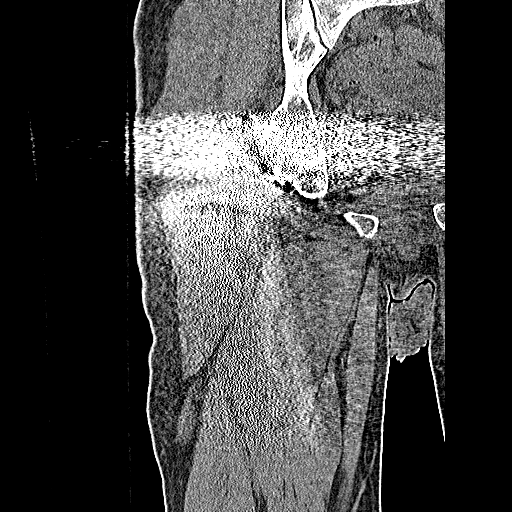

[17 of 46 positions shown; findings below may reference images not displayed]

FINDINGS: Severe artifacts secondary to metallic components of RIGHT hip
prosthesis.

Visualized pelvis grossly intact.

No definite acute fracture of dislocation seen within limitations of
metallic artifacts.

Bones appear demineralized.

No significant periprosthetic lucency.

Nutrient foramen seen at the posterior aspect of the femoral
diaphysis at the level of the distal portion of the femoral
component.

No definite acute osseous abnormalities identified.

Plate and screws seen at the posterior column RIGHT acetabular post
ORIF.

Intrapelvic soft tissues unremarkable.
IMPRESSION: RIGHT hip prosthesis.

No definite acute osseous findings.

## 2016-09-22 IMAGING — CR DG CHEST 2V
2 series · 2 of 2 positions shown · non-contrast
Comparison: October 27, 2013

CLINICAL DATA: Difficulty breathing and chest pain; sickle cell
disease

EXAM:
CHEST  2 VIEW

[w chest pa]
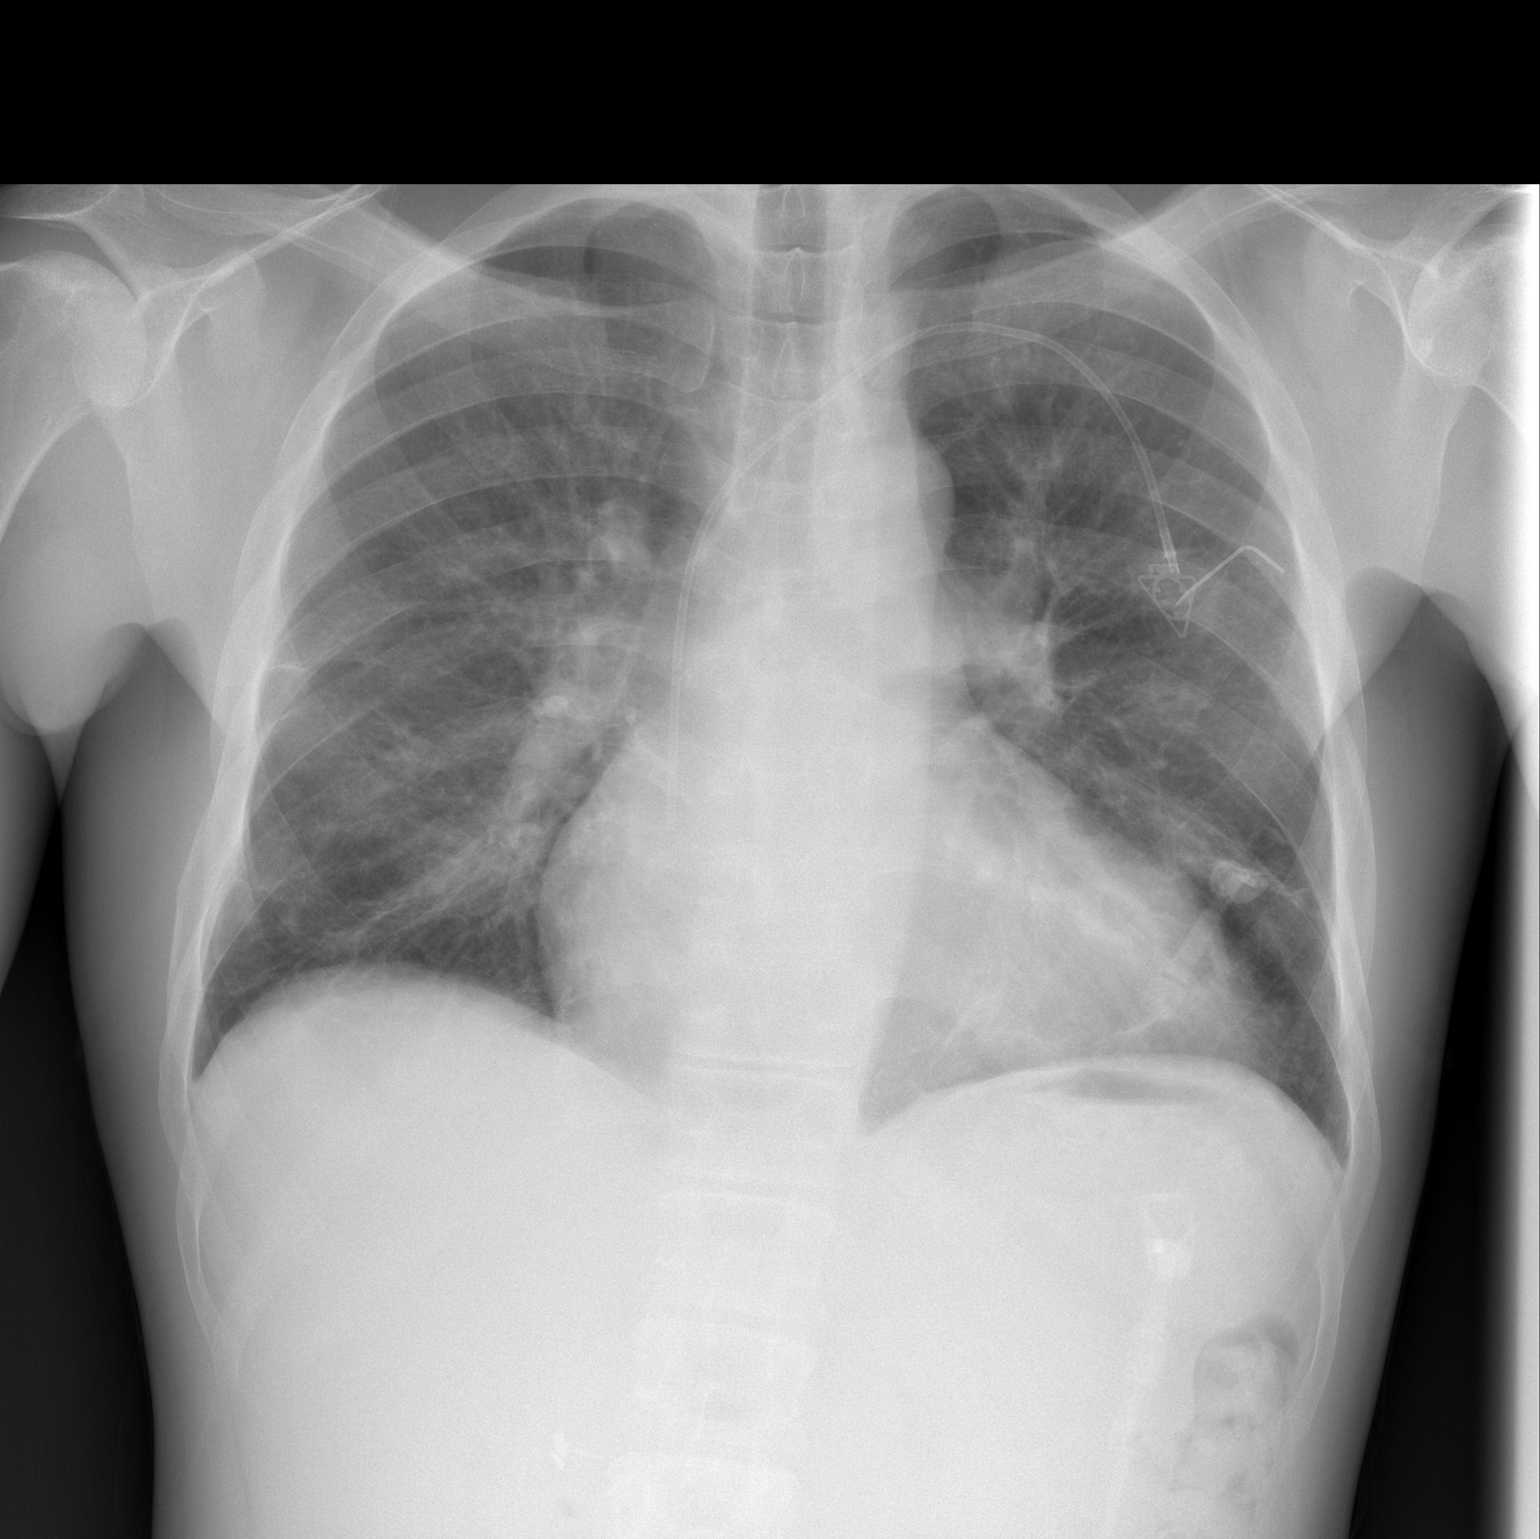

[w chest lat]
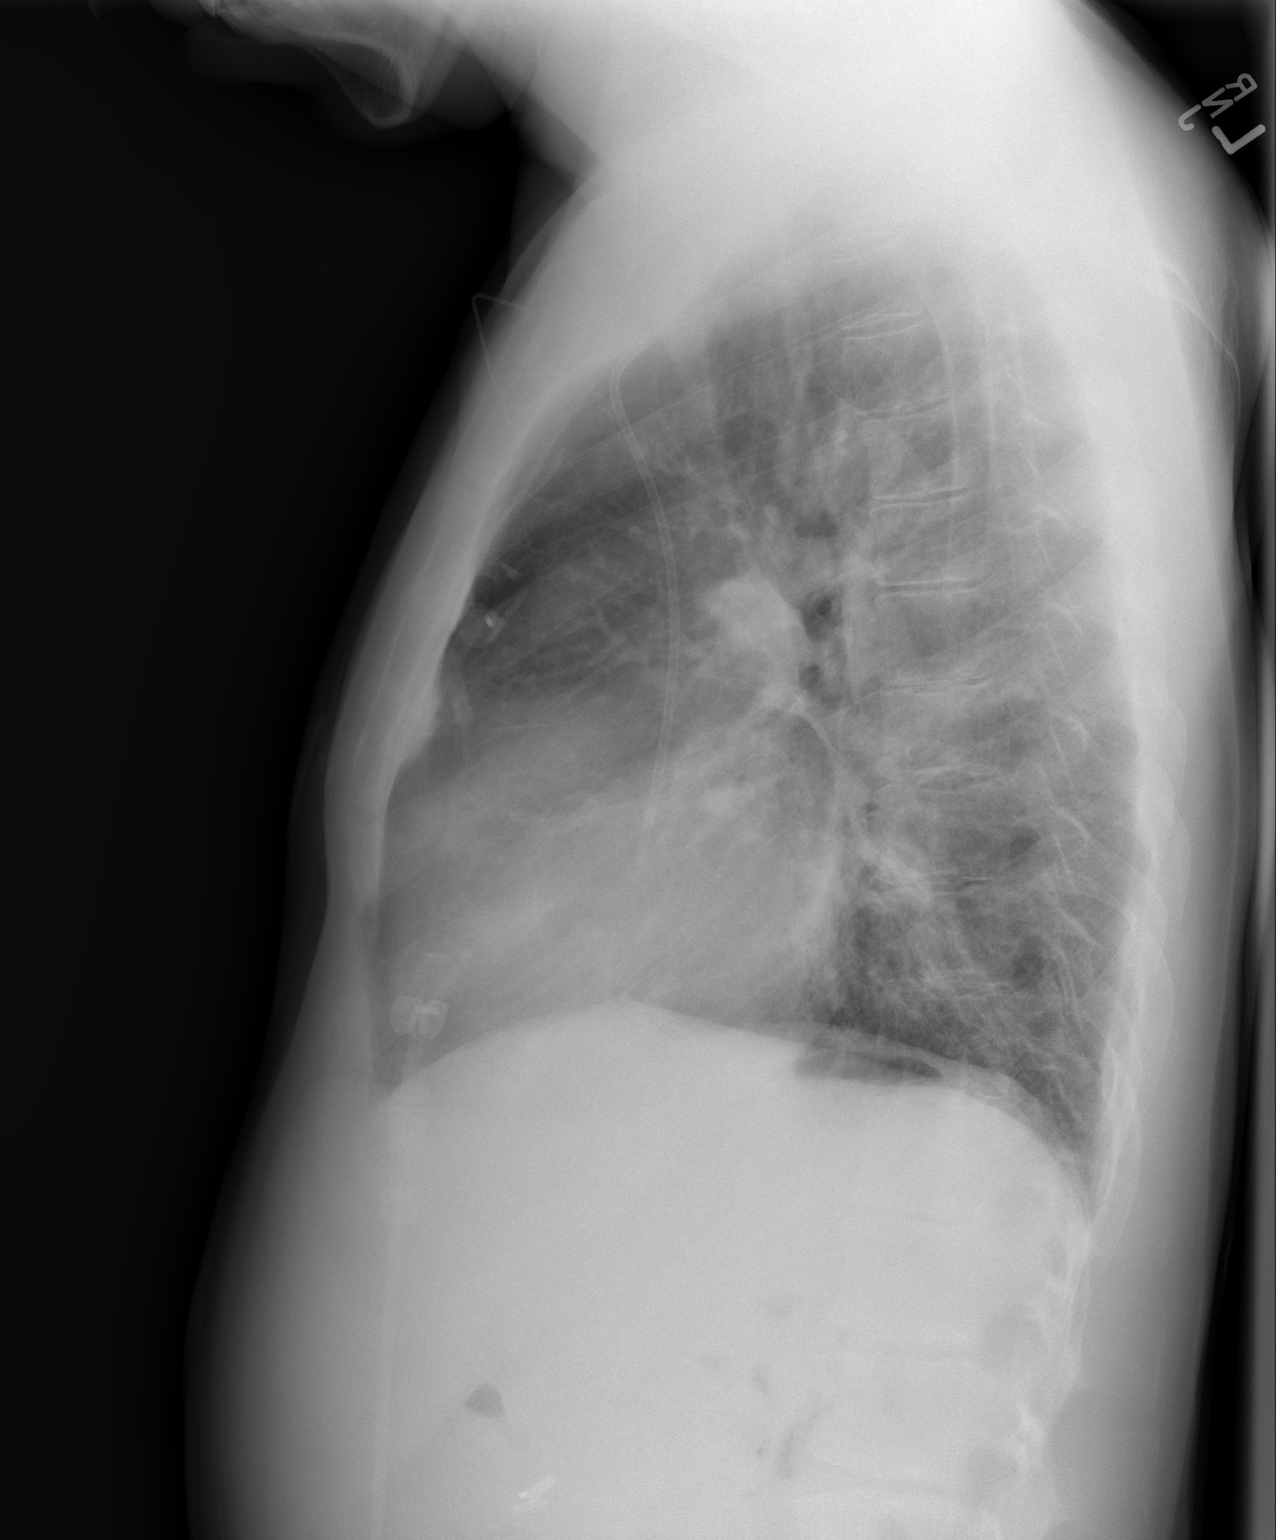

[2 of 2 positions shown; findings below may reference images not displayed]

FINDINGS: Port-A-Cath tip is at the cavoatrial junction. No pneumothorax.
Areas of mild scarring are noted bilaterally. There is no edema or
consolidation. Heart is borderline enlarged with pulmonary
vascularity showing mild pulmonary venous hypertension. No
adenopathy. There are no appreciable bone lesions.
IMPRESSION: Areas of mild scarring bilaterally. Evidence of mild pulmonary
venous congestion, a finding common with sickle cell disease. No
frank edema or consolidation.

## 2016-09-26 ENCOUNTER — Ambulatory Visit: Payer: Self-pay | Admitting: Infectious Diseases

## 2016-10-01 NOTE — Progress Notes (Signed)
Notified by CCMD that patient's HR was sustaining in 140's. Notified MD and order given for Metoprolol 5mg  IV Once.   Milford Cage, RN

## 2016-10-01 NOTE — Progress Notes (Addendum)
Entered in Error

## 2016-10-01 NOTE — Progress Notes (Signed)
Patient called out to use bathroom, patient was already sitting on side of bed. 1 assist to [facility]. Patient was weak but able to get to [facility] with contact guard assist. Patient had a BM and then developed tremors and began  to slump over on [facility], this RN called for assistance. Patient became unresponsive and Brien Mates, RN; Pieter Partridge, RN; Mayford Knife, RN; and Flint Melter, NT arrived to room and assisted with getting him back to bed. CPR initiated and code blue called.

## 2016-10-01 NOTE — Progress Notes (Signed)
Called to room urgently by [facility],RN , who had gotten patient up to bedside commode. Patient had become unresponsive. Patient placed in bed and code called. MD notified. See notes from MD and fellow RN.  Milford Cage, RN

## 2016-10-01 NOTE — Code Documentation (Signed)
  Patient Name: [name]   MRN: [id number]   Date of Birth/ Sex: [birth year] , male      Admission Date: 09/29/2016  Attending Provider: Allie Bossier, MD  Primary Diagnosis: Acidosis [E87.2] Hypoxia [R09.02] Acute renal failure (ARF) ([place]) [N17.9] Sepsis, due to unspecified organism [facility]) [A41.9] Acute on chronic heart failure, unspecified heart failure type ([place]) [I50.9]   Indication: Pt was in his usual state of health until this AM, when he was went to use the bathroom and lost consciousness and pulse. Code blue was subsequently called. At the time of arrival on scene, ACLS protocol was underway.   Technical Description:  - CPR performance duration:  40 minutes  - Was defibrillation or cardioversion used? No   - Was external pacer placed? No  - Was patient intubated pre/post CPR? No   Medications Administered: Y = Yes; Blank = No Amiodarone    Atropine    Calcium    Epinephrine  Y  Lidocaine    Magnesium    Norepinephrine    Phenylephrine    Sodium bicarbonate  Y  Vasopressin    Other    Post CPR evaluation:  - Final Status - Was patient successfully resuscitated ? No   Miscellaneous Information:  - Time of death:  5:49 AM  - Primary team notified?  Yes  - Family Notified? Yes     Neva Seat, MD   2016/10/11, 5:55 AM

## 2016-10-01 NOTE — Progress Notes (Signed)
Paged to the bedside at [facility] for a code blue. On arrival, patient had been given CPR for approximately 16 minutes and had received six rounds of epi. He was nonresponsive, asystolic. Mother was called by CCM and informed of pt status at 28. Patient coded till 26 when time of death was called. Awaiting on arrival of family member to discuss patient outcome.   Jeannette Corpus, APRN [facility] [phone]

## 2016-10-01 DEATH — deceased

## 2016-10-18 IMAGING — CT CT ANGIO CHEST
1 of 2 series · 19 of 32 positions shown · IV contrast (OMNIPAQUE 350)
Comparison: 08/26/2013

CLINICAL DATA: Sickle cell trait. Lower back and bilateral leg
pain. Mild shortness of breath. Initial encounter

EXAM:
CT ANGIOGRAPHY CHEST WITH CONTRAST
TECHNIQUE: Multidetector CT imaging of the chest was performed using the
standard protocol during bolus administration of intravenous
contrast. Multiplanar CT image reconstructions and MIPs were
obtained to evaluate the vascular anatomy.
CONTRAST:  100mL OMNIPAQUE IOHEXOL 350 MG/ML SOLN

[Series 7: thins for pacs · axial · 0.74mm/px · z∈[+129,+360]mm · 19 of 257 slices shown]
[im 13/257  lung]
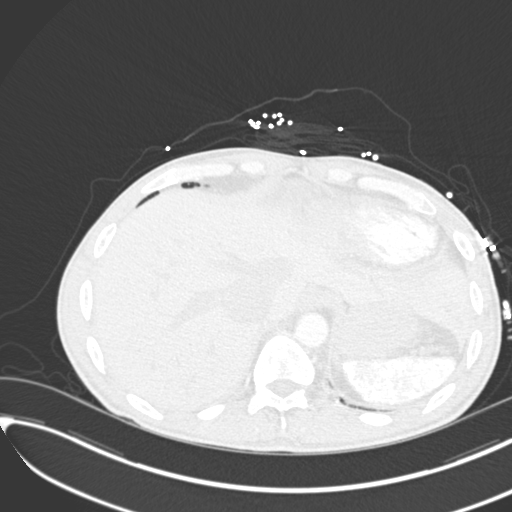
[im 26/257  mediastinal]
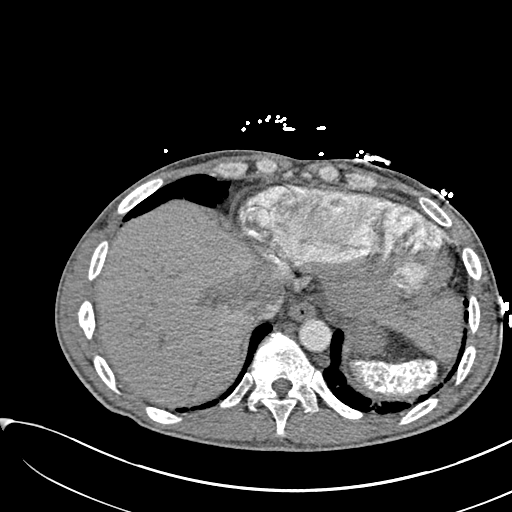
[im 39/257  lung]
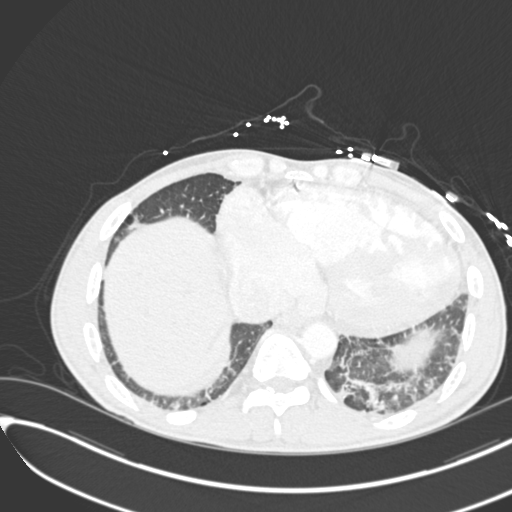
[im 65/257  mediastinal]
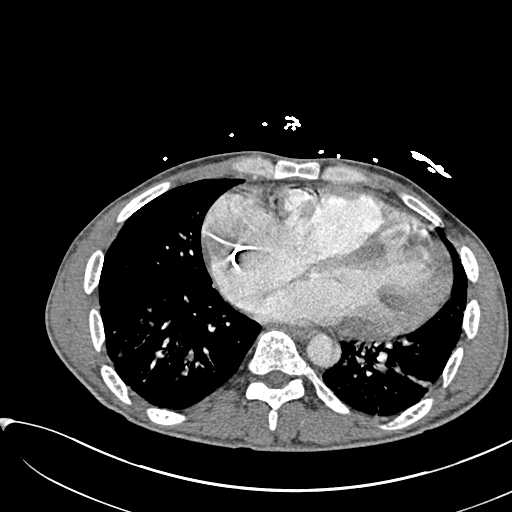
[im 77/257  lung]
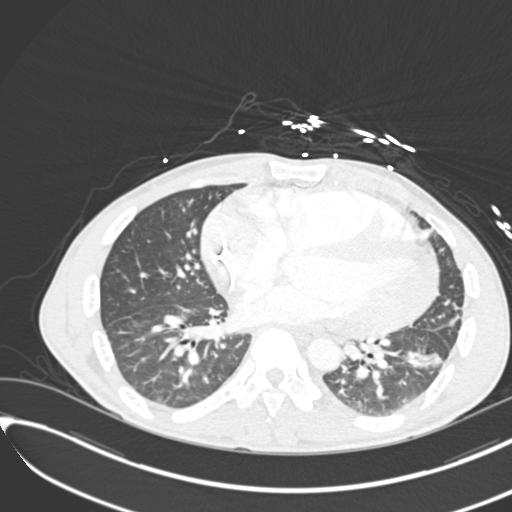
[im 86/257  mediastinal]
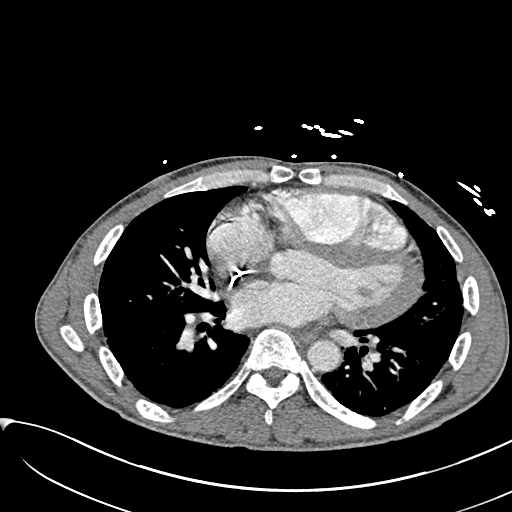
[im 90/257  lung]
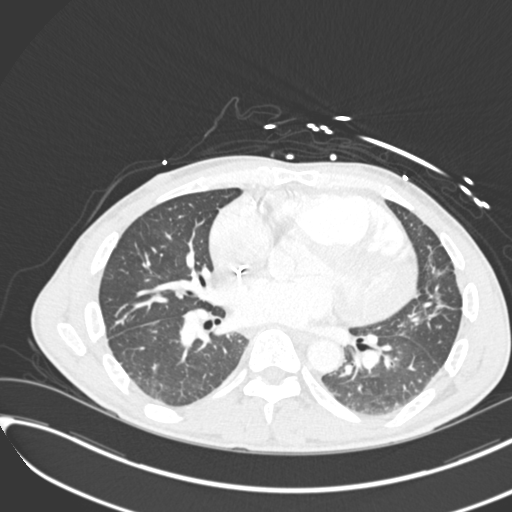
[im 103/257  mediastinal]
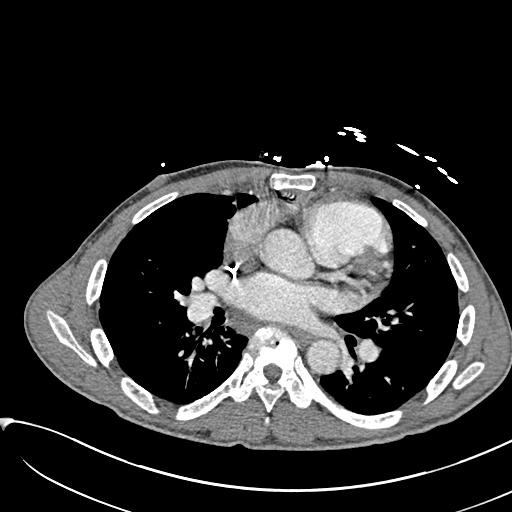
[im 116/257  lung]
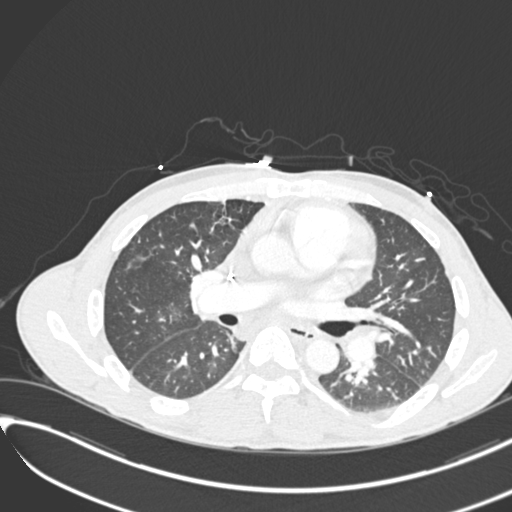
[im 129/257  mediastinal]
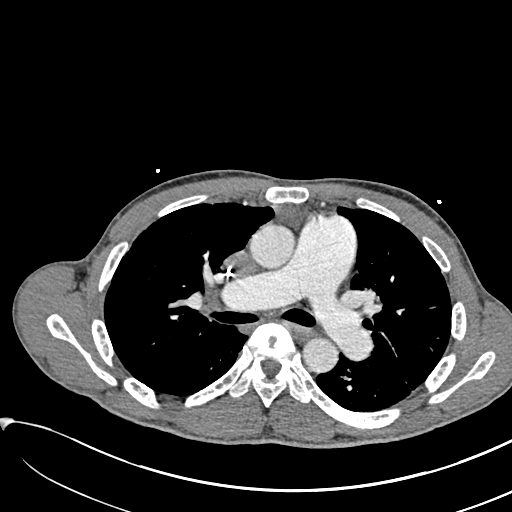
[im 141/257  lung]
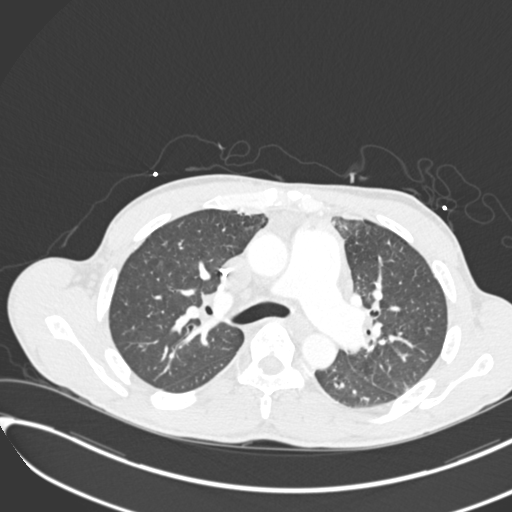
[im 154/257  mediastinal]
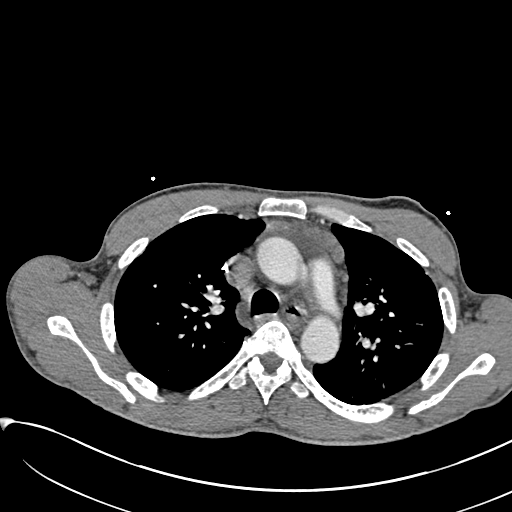
[im 167/257  lung]
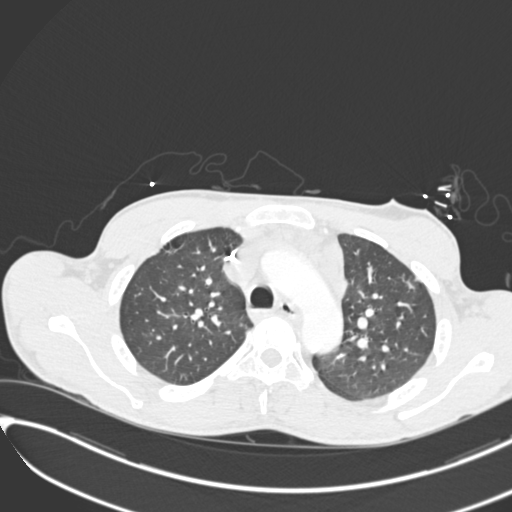
[im 171/257  mediastinal]
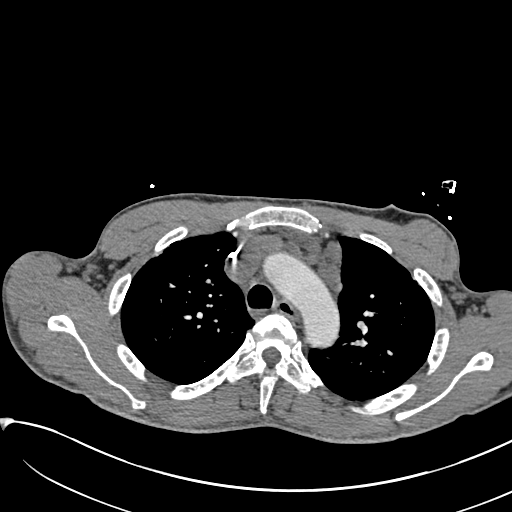
[im 180/257  lung]
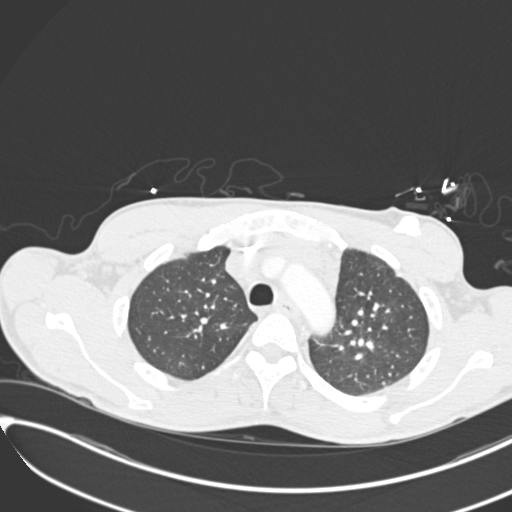
[im 193/257  mediastinal]
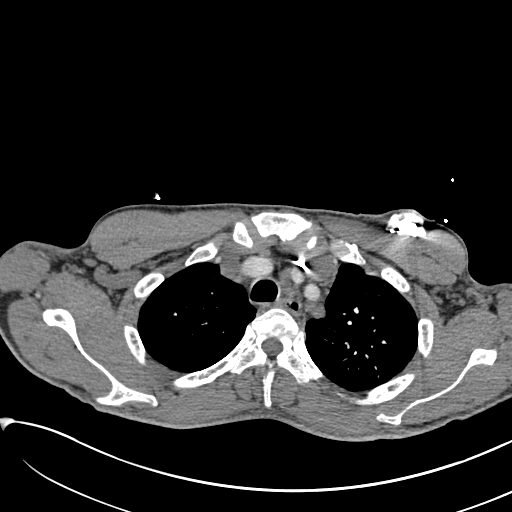
[im 218/257  lung]
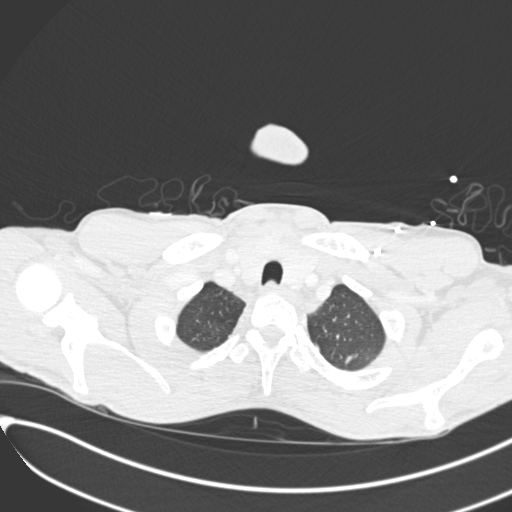
[im 231/257  mediastinal]
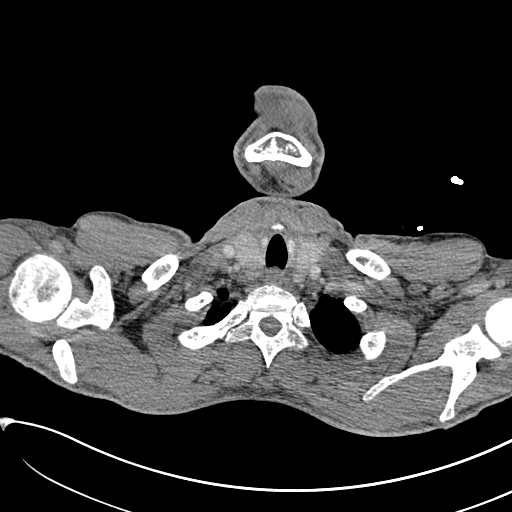
[im 244/257  lung]
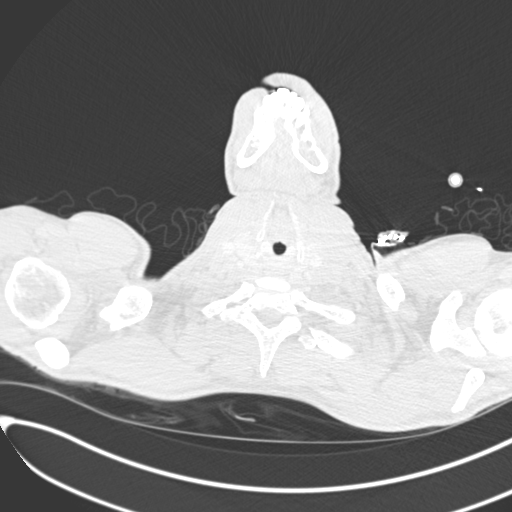

[19 of 32 positions shown; findings below may reference images not displayed]

FINDINGS: THORACIC INLET/BODY WALL:

Left subclavian porta catheter in unremarkable position. No acute
findings.

MEDIASTINUM:

Chronic cardiomegaly. No pericardial effusion. There is right heart
enlargement and main pulmonary artery enlargement (42 mm) suggesting
pulmonary hypertension. As permitted by bolus dispersion and
intermittent respiratory motion, no pulmonary embolism. No aortic
dissection or aneurysm. Chronic mediastinal lymphadenopathy,
especially superior and anterior mediastinal. Thymus remains visible
and stable from previous imaging.

LUNG WINDOWS:

There is chronic interstitial opacities in the lower lungs with
volume loss, likely a combination of scarring and atelectasis.
Patchy reticular nodular opacities are also chronic. No definitive
pneumonia or edema. No effusion or pneumothorax.

UPPER ABDOMEN:

Small and calcified spleen consistent with auto infarction.
High-density liver, likely secondary hemosiderosis.

OSSEOUS:

Typical osseous changes of sickle cell disease with thoracic
endplate concavities and bilateral humeral head osteonecrosis.

Review of the MIP images confirms the above findings.
IMPRESSION: 1. Negative for pulmonary embolism.
2. Chronic lung disease without definite pneumonia or edema.
3. Chronic findings are stable from priors and noted above.

## 2016-10-31 NOTE — Death Summary Note (Signed)
Death Summary  [name] WFU:[id number] DOB: 04-10-1980 DOA: 2016-09-24  PCP: Tresa Garter, MD PCP/Office notified: no  Admit date: 09-24-2016 Date of Death: 11/10/16  Final Diagnoses:  Active Problems:   Acute on chronic respiratory failure with hypoxia (HCC)   Sepsis (HCC) Acute on chronic respiratory failure with hypoxia  -Secondary to worsening CHF, patient's EF post surgery has decreased from 60-65% ---> 45-50%, massively dilated right ventricle see echocardiogram results below    Acute systolic CHF nonischemic -Strict in and out +8.8 L -Daily weight -Patient's BP will not tolerate beta blocker,ACEI/ARB, or increased narcotics/benzodiazepine  Pulmonary HTN -See CHF    Tricuspid and Mitral vavle endocarditis  -S/p mechanical mitral valve and tricuspid ring valve at [facility] July 2018 -Valves functioning properly per TTE per Cards service    Hx PE -Fully anticoagulated w/ warfarin   -INR goal 2.5 -3.5 -Subtherapeutic   positive STAPHYLOCOCCUS EPIDERMIDIS oxacillin resistant Bacteremia -On previous admission in July, patient has been on vancomycin since July, and has had valves replaced at [place] with Dr. Carlyle Basques ID on 8/16, stated patient only needed to be on 6 weeks of Vancomycin (will verify with pharmacy exact start date). Patient Can come off of isolation. Obtain blood culture after patient has been off vancomycin   Acute renal failure -Resolving   Sickle Cell Anemia  -Currently stable  Chronic pain  -On chronic high dose narcotics  -Patient request additional narcotics: See CHF       History of present illness:  36 y.o. BM PMHx Sickle Cell Anemia Pulmonary HTN, PE, MRSA bacteremia in July with blood cultures done at [facility]. Patient was evaluated by ID that recommended removal of port. The patient remained in place until transfer to do. He was ultimately found to have a large vegetation measuring at  2.6 cm in maximal dimension on transesophageal echocardiogram on his mitral valve with a possible PFO. Patient is followed by post hematology and pulmonology at [facility] and therefore transferred to [facility] was felt to be most appropriate. Patient underwent mechanical mitral valve replacement at [facility] as well as tricuspid valve ring on 7/17 as per his electronic medical record.    In the emergency department the patient was started on noninvasive positive pressure ventilation for tenuous respiratory status. Lactic acidosis was noted and a bicarbonate drip was initiated. Empiric Vancomycin and Zosyn was administered. Lasix was given initially IV as well as a Nitroglycerin infusion.The patient's complex medical history PCCM was consulted. The patient reports that he has had increasing lower extremity edema for approximately 1 week. Denies any chest pain, pressure, or tightness. He has experienced increased shortness of breath particularly over the last 1-2 days with associated fever in that period of time. Denies any purulent cough. Reports only minimal back discomfort and denies any joint pain, swelling, or erythema. He reports he remotely did have acute chest syndrome but has never been endotracheally intubated. Reports he has been compliant with his antibiotic and anticoagulant regimen. He reports he has had increased thirst lately but denies any increased sodium consumption. No recent sick contacts or travel.  Patient being treated for multisystem organ failure. See above diagnosis treatments. Per NP Lovey Newcomer Paged to the bedside at [facility] for a code blue. On arrival, patient had been given CPR for approximately 16 minutes and had received six rounds of epi. He was nonresponsive, asystolic. Mother was called by CCM and informed of pt status  at 0535. Patient coded till 76 when time of death was called. Awaiting on arrival of family member to discuss patient  outcome.       Time: 0 549  Signed:  Dia Crawford, MD [facility] [phone]

## 2016-11-14 IMAGING — CR DG CHEST 2V
2 series · 2 of 2 positions shown · non-contrast
Comparison: 10/30/2013

CLINICAL DATA: Fever, weakness and body aches. History of sickle
cell anemia.

EXAM:
CHEST - 2 VIEW

[w chest pa]
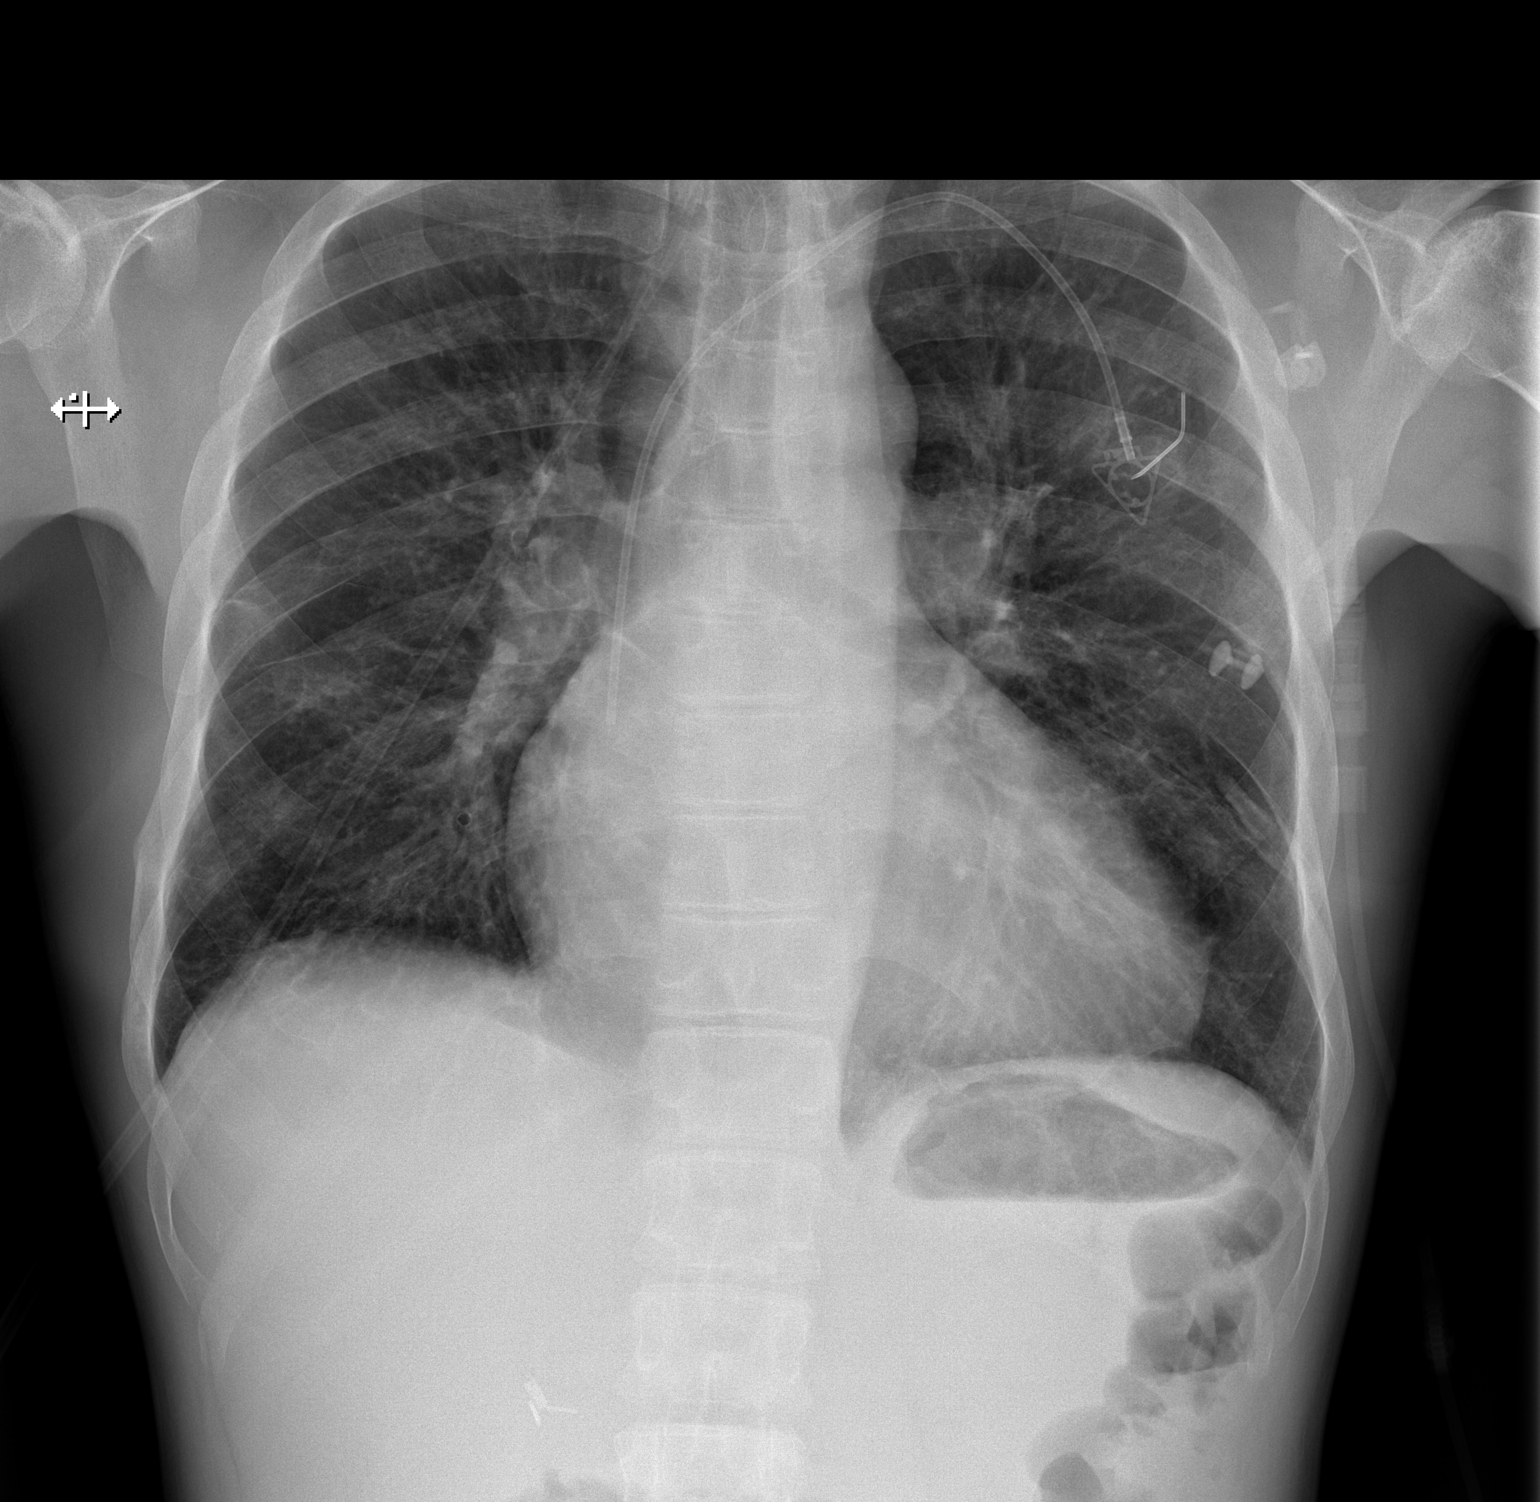

[w chest lat]
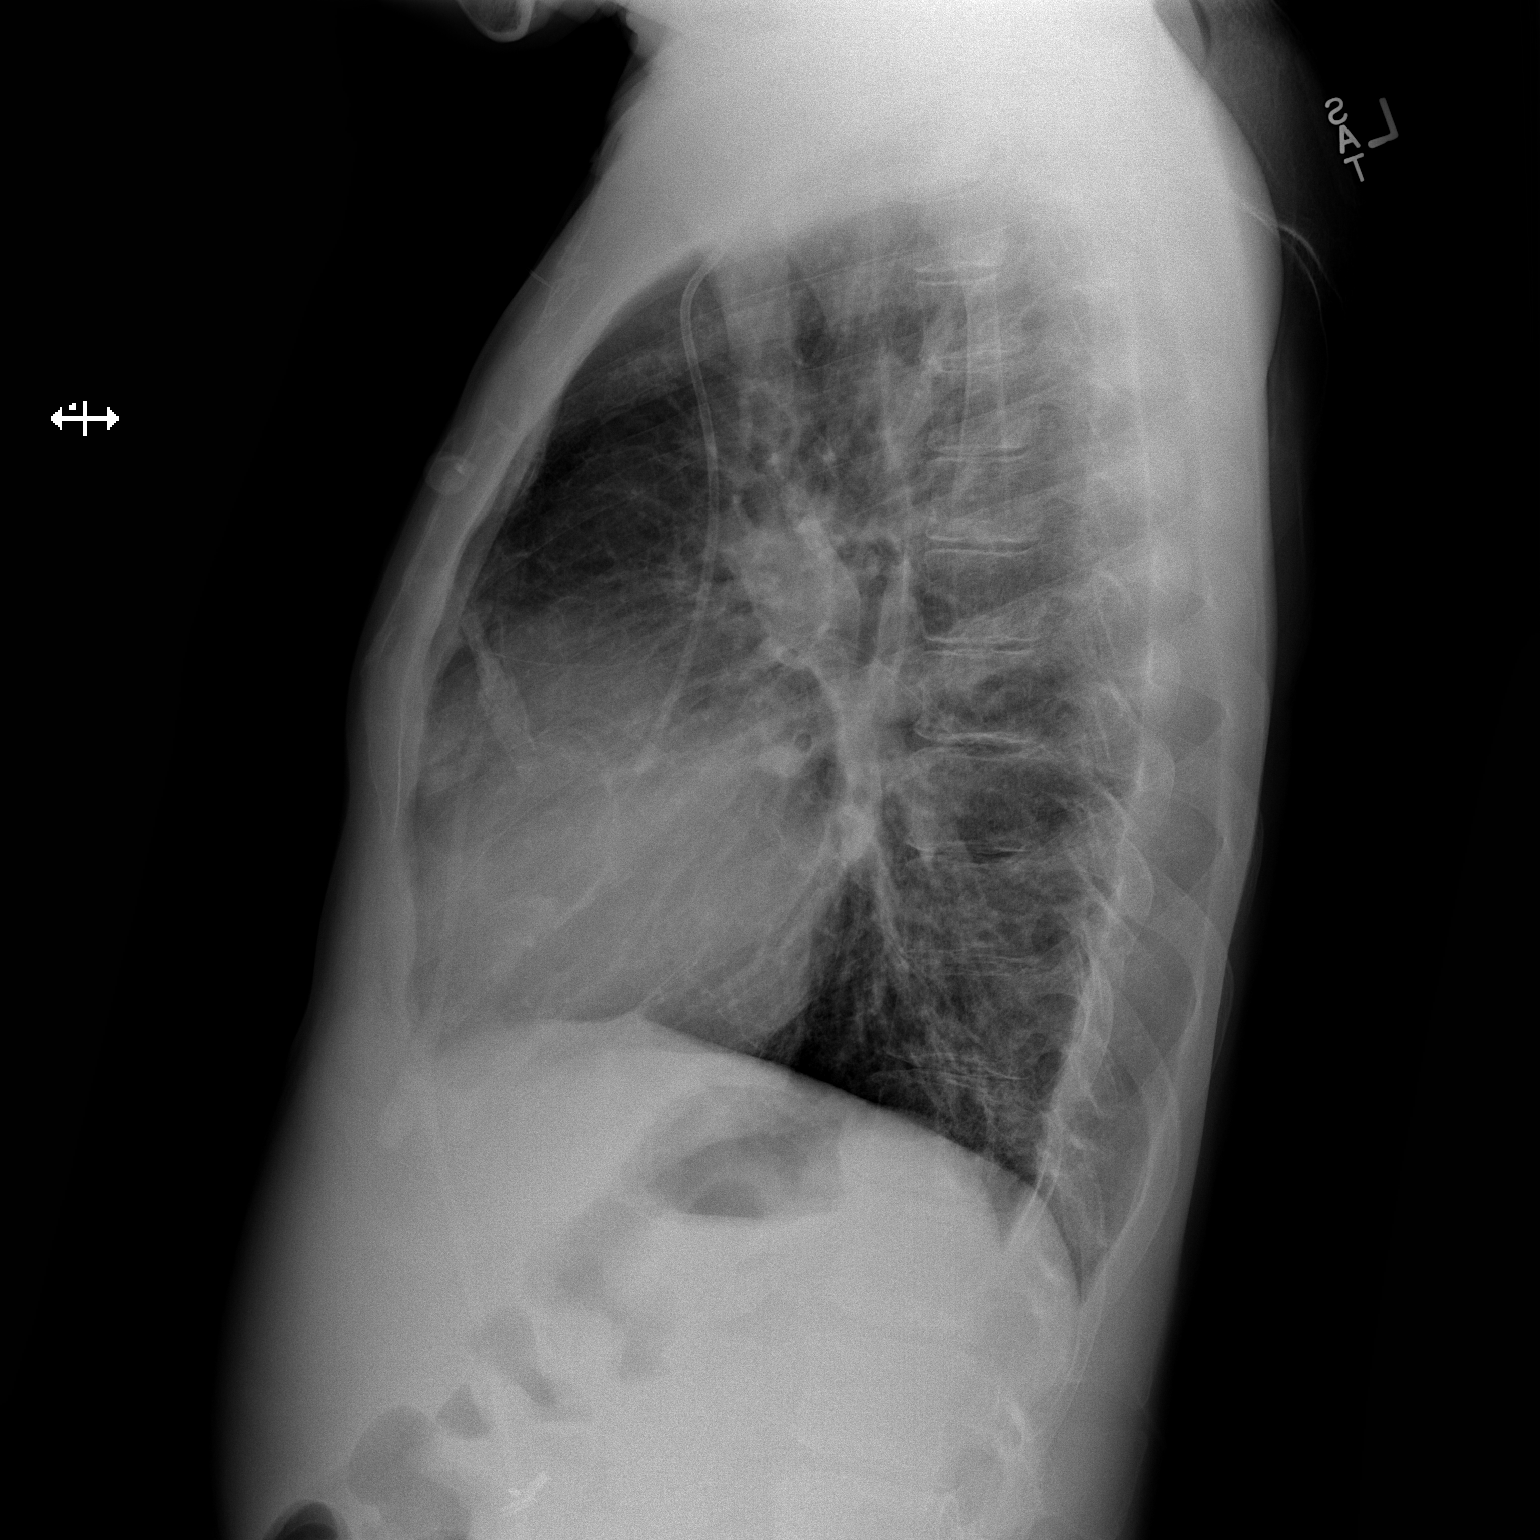

[2 of 2 positions shown; findings below may reference images not displayed]

FINDINGS: Stable appearance of left-sided port with catheter tip at the
cavoatrial junction. Stable appearance of pulmonary scarring
bilaterally. There is no evidence of pulmonary edema, consolidation,
pneumothorax, nodule or pleural fluid. The heart size is stable and
at the upper limits of normal. The visualized bony thorax is
unremarkable.
IMPRESSION: No active disease.  Stable pulmonary scarring.

## 2016-11-15 IMAGING — CR DG CHEST 2V
2 series · 2 of 2 positions shown · non-contrast
Comparison: 12/22/2013

CLINICAL DATA: Sickle cell crisis, rib pain

EXAM:
CHEST  2 VIEW

[w chest pa]
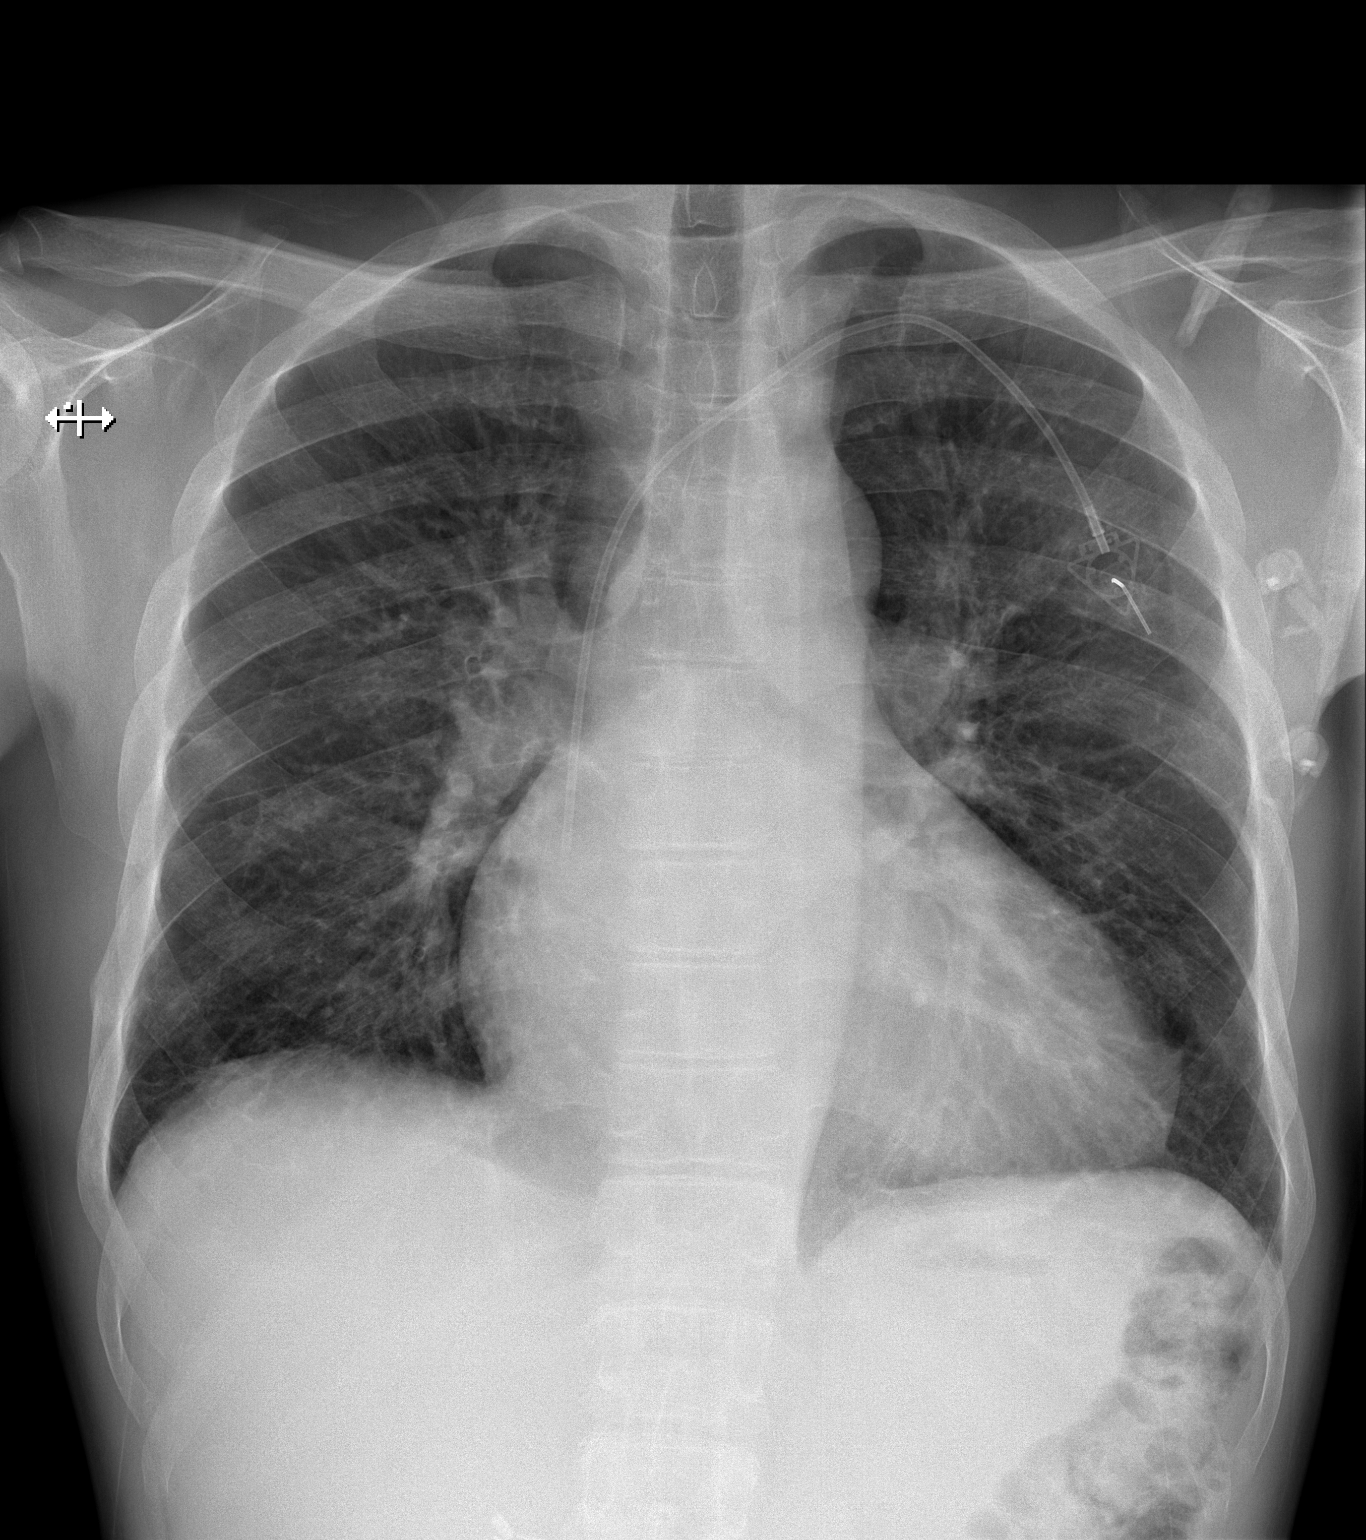

[w chest lat]
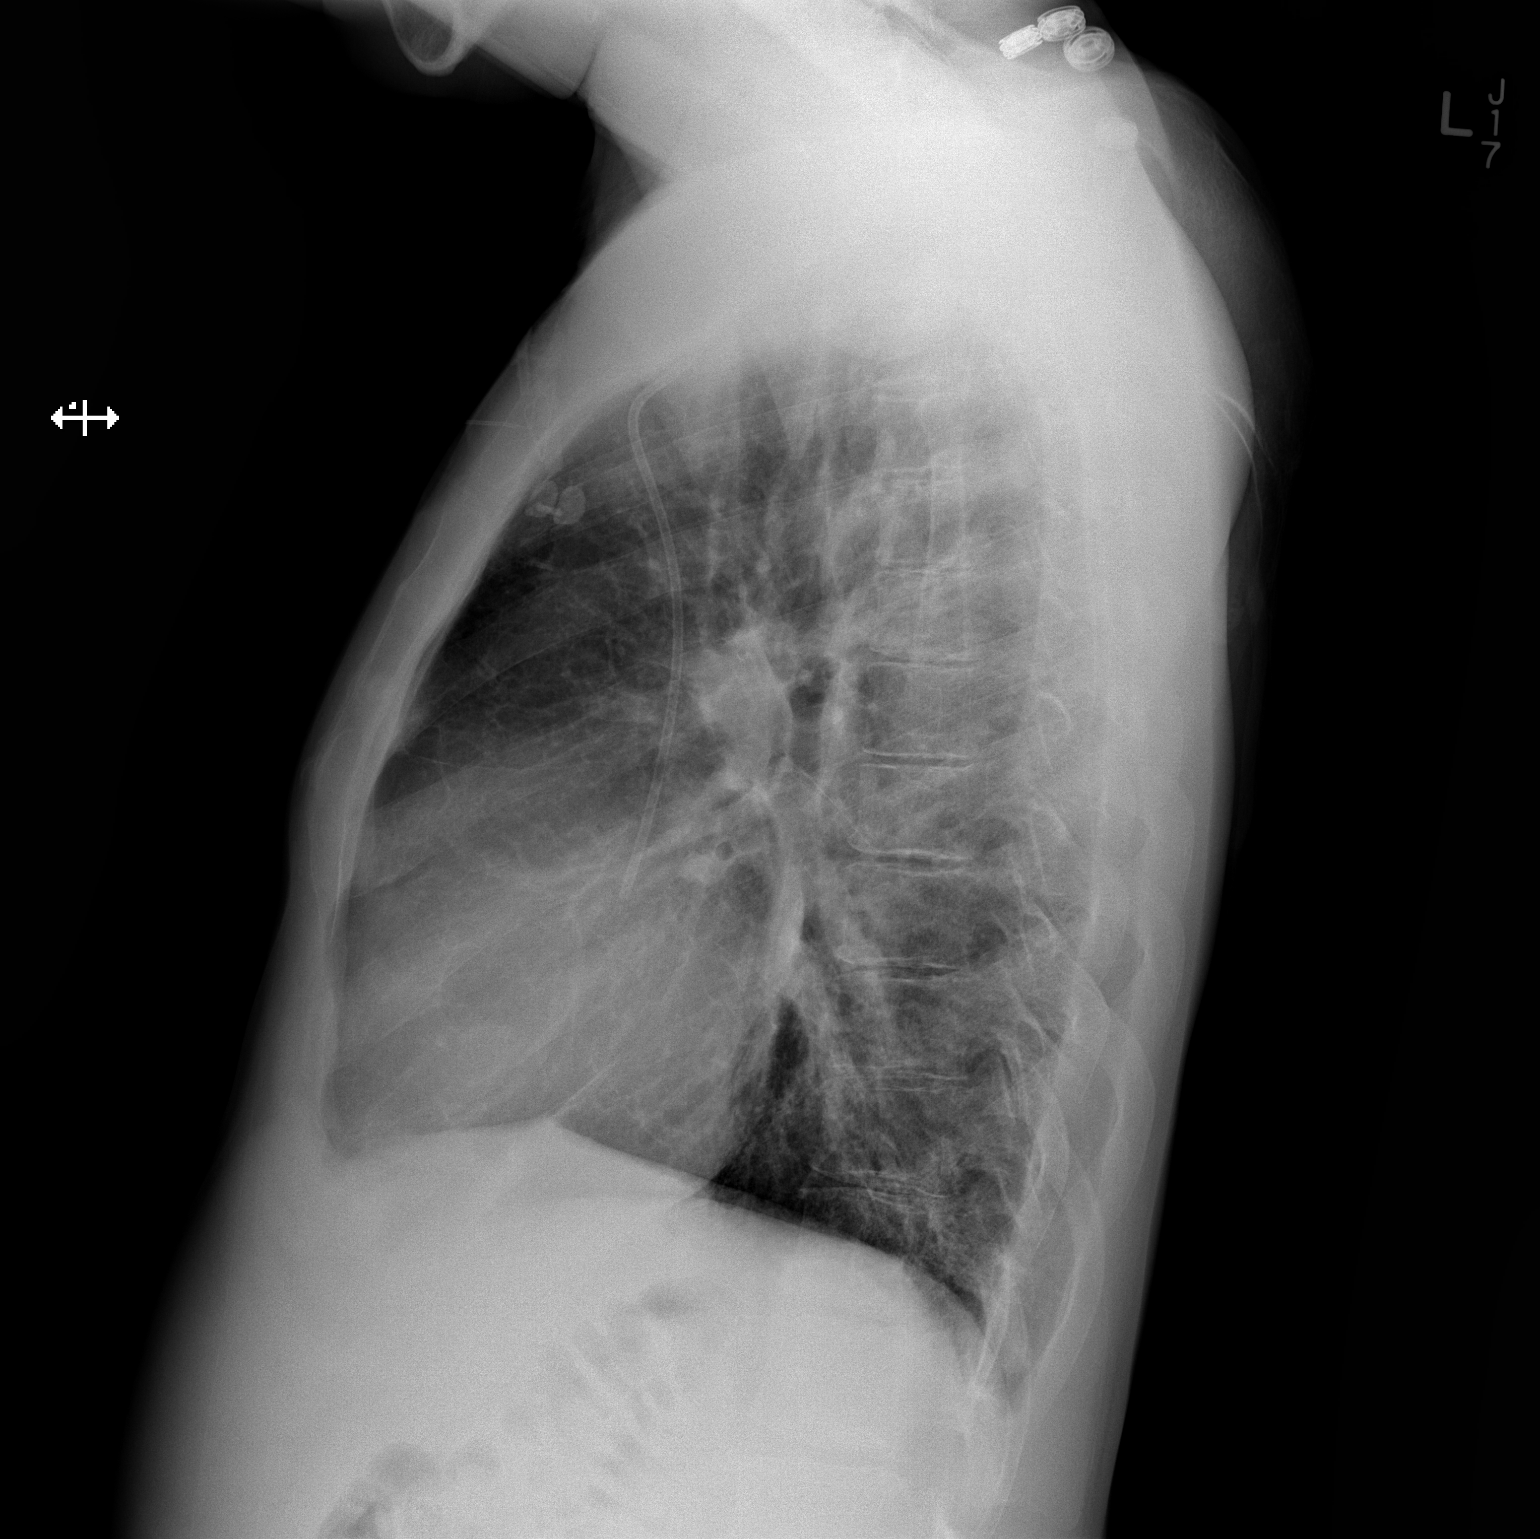

[2 of 2 positions shown; findings below may reference images not displayed]

FINDINGS: LEFT subclavian power port with tip in SVC.

Enlargement of cardiac silhouette with pulmonary vascular
congestion.

Moderate peribronchial thickening with accentuation of perihilar
markings.

Minimal RIGHT base atelectasis.

Minimal scarring RIGHT mid lung and LEFT lower lobe.

No acute infiltrate, pleural effusion or pneumothorax.

Bones unremarkable.
IMPRESSION: Enlargement of cardiac silhouette with pulmonary vascular
congestion.

Moderate bronchitic changes with RIGHT basilar atelectasis and
scattered scarring.

No acute abnormalities.

## 2016-11-18 NOTE — Progress Notes (Signed)
Physical Therapy Evaluation Addendum for G-Codes    09/29/2016 1154  PT G-Codes **NOT FOR INPATIENT CLASS**  Functional Assessment Tool Used Clinical judgement  Functional Limitation Mobility: Walking and moving around  Mobility: Walking and Moving Around Current Status [phone]) CK  Mobility: Walking and Moving Around Goal Status [phone]) CJ   Rolinda Roan, PT, DPT Acute Rehabilitation Services Pager: [phone]

## 2016-11-24 IMAGING — CT CT ANGIO CHEST
1 of 2 series · 18 of 32 positions shown · IV contrast (OMNIPAQUE 350)
Comparison: Radiographs today.  Chest CT 11/25/2013.

CLINICAL DATA: Bilateral chest pain for 1 day. History of sickle
cell anemia. Initial encounter.

EXAM:
CT ANGIOGRAPHY CHEST WITH CONTRAST
TECHNIQUE: Multidetector CT imaging of the chest was performed using the
standard protocol during bolus administration of intravenous
contrast. Multiplanar CT image reconstructions and MIPs were
obtained to evaluate the vascular anatomy.
CONTRAST:  100mL OMNIPAQUE IOHEXOL 350 MG/ML SOLN

[Series 6: thins for pacs · axial · 0.74mm/px · z∈[-127,+158]mm · 18 of 317 slices shown]
[im 16/317  lung]
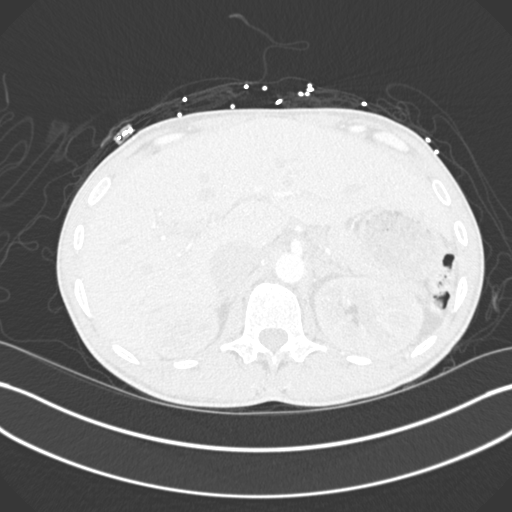
[im 32/317  mediastinal]
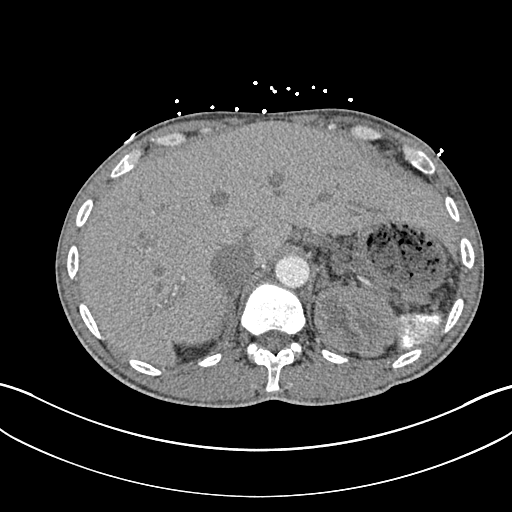
[im 64/317  lung]
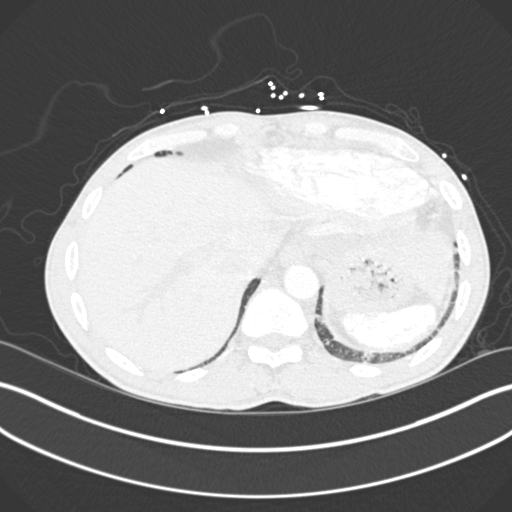
[im 80/317  mediastinal]
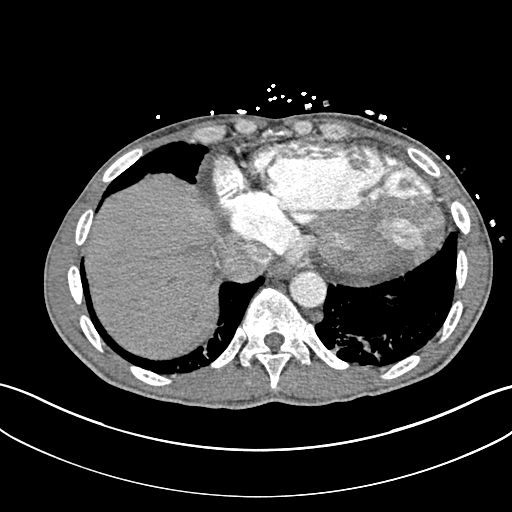
[im 95/317  lung]
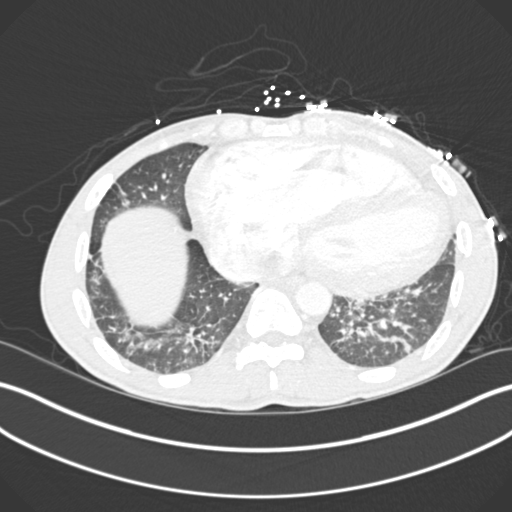
[im 106/317  mediastinal]
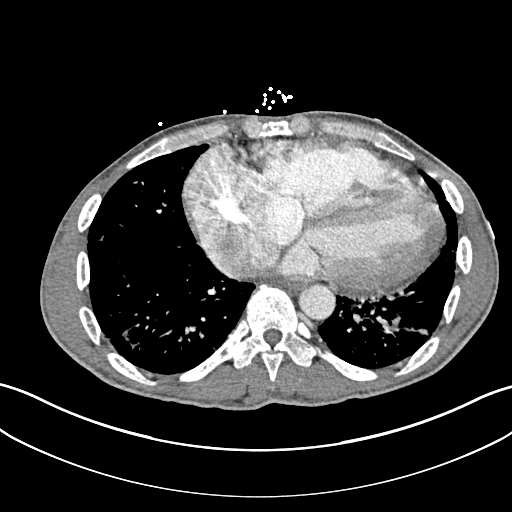
[im 111/317  lung]
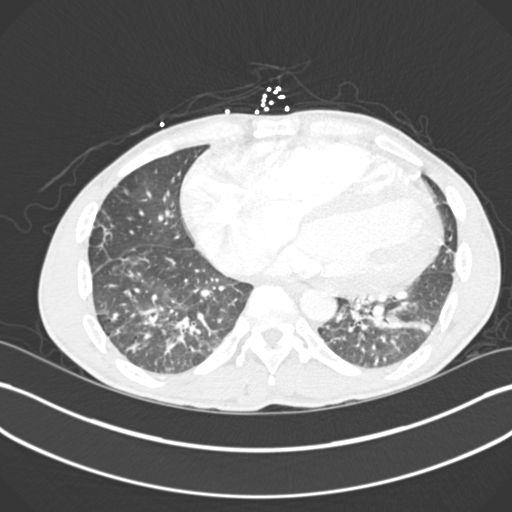
[im 143/317  mediastinal]
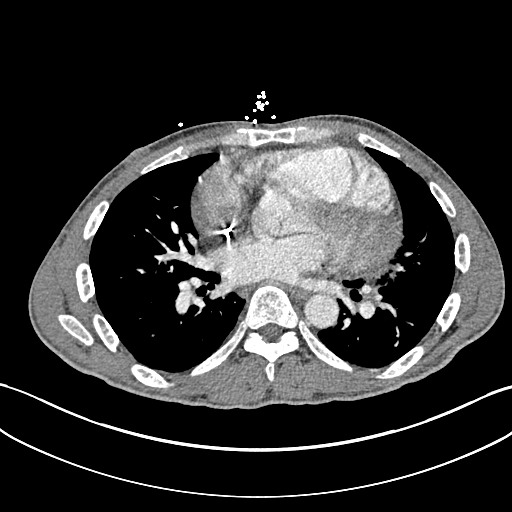
[im 149/317  lung]
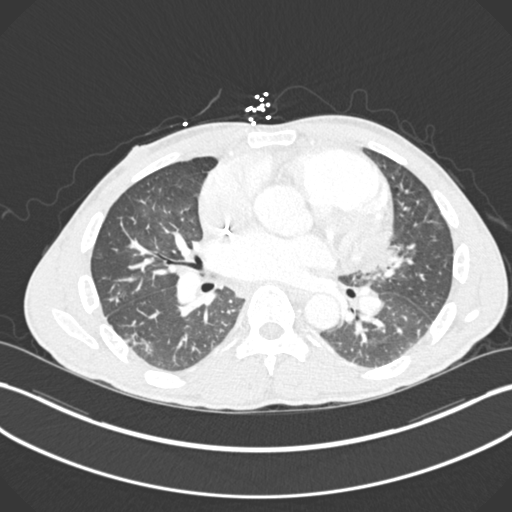
[im 159/317  mediastinal]
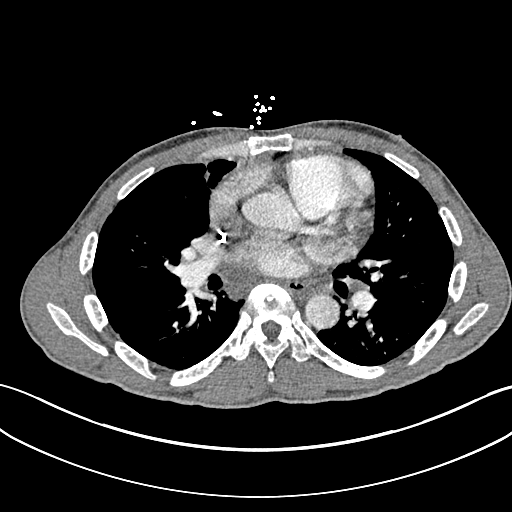
[im 174/317  lung]
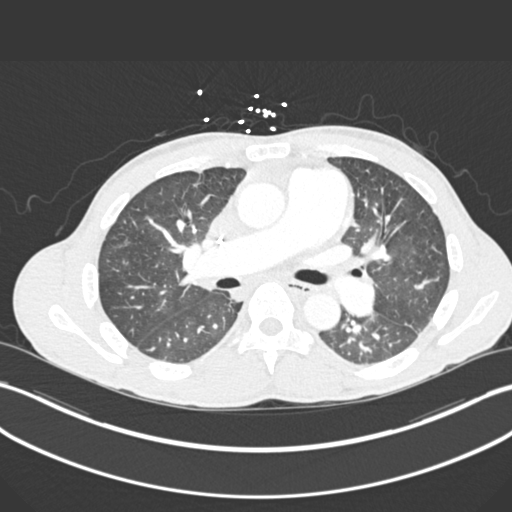
[im 206/317  mediastinal]
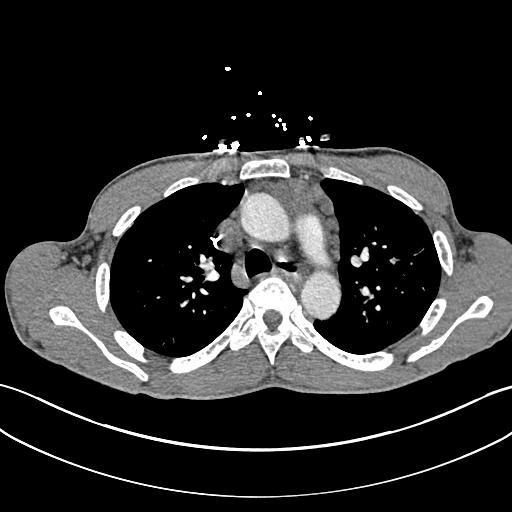
[im 211/317  lung]
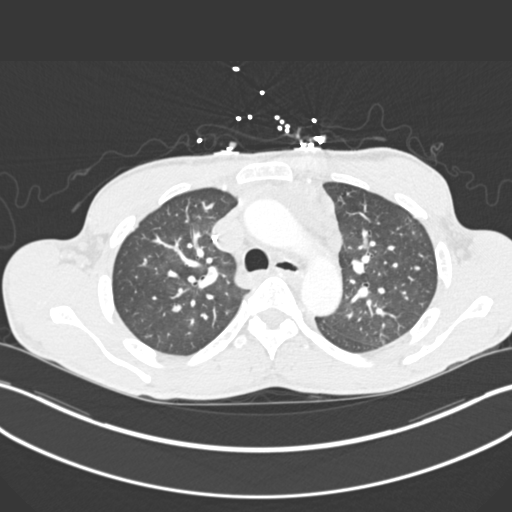
[im 222/317  mediastinal]
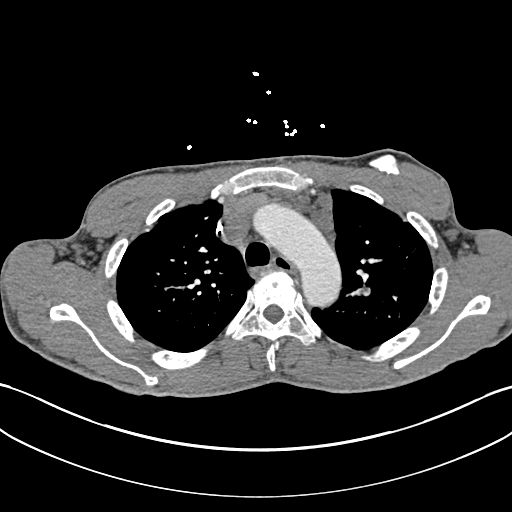
[im 238/317  lung]
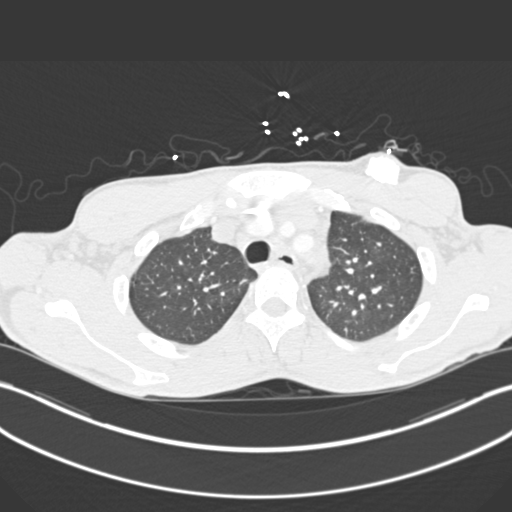
[im 253/317  mediastinal]
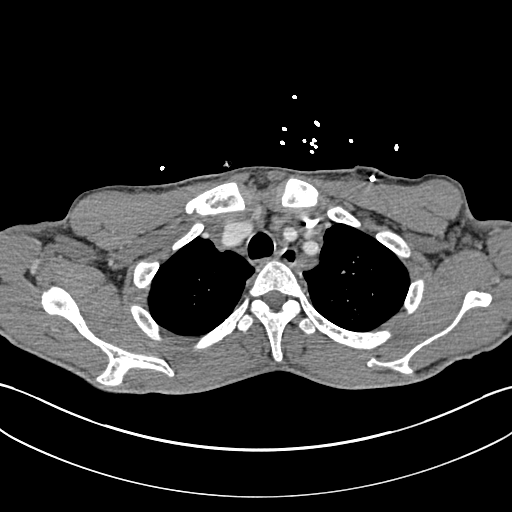
[im 285/317  lung]
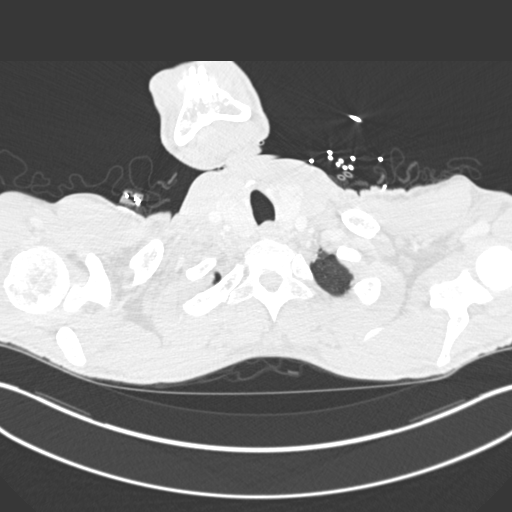
[im 301/317  mediastinal]
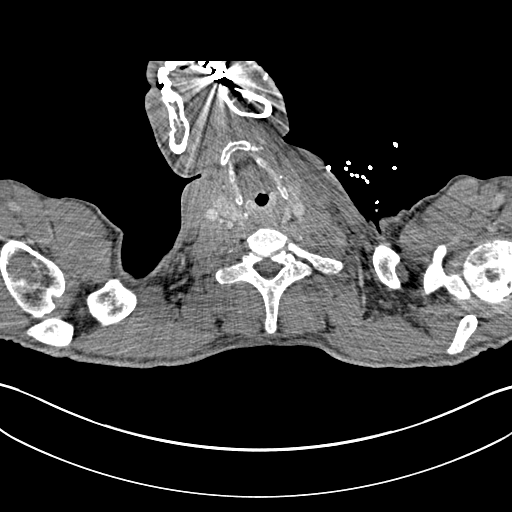

[18 of 32 positions shown; findings below may reference images not displayed]

FINDINGS: Vascular: The pulmonary arteries are well opacified with contrast.
There is a tiny nonobstructing filling defect within a left upper
lobe a pulmonary arterial subsegmental branch. This is best seen on
thin section axial images 91-98 and appears new. No other pulmonary
emboli are demonstrated. The central pulmonary arteries appear
widely patent. There is stable dilatation the pulmonary arteries and
the right cardiac chambers, but no signs of acute right heart
strain. No significant atherosclerosis demonstrated.Cardiomegaly
appears stable.

Mediastinum: There is stable nodularity within the anterior
mediastinum consistent with residual thymic tissue. Small
mediastinal and hilar lymph nodes are stable. The thyroid gland,
trachea and esophagus demonstrate no significant findings. The left
subclavian Port-A-Cath appears unchanged.

Lungs/Pleura: There is no pleural or pericardial effusion.There is
stable chronic lung disease with basilar scarring and chronic
interstitial opacities. No superimposed airspace disease or
suspicious pulmonary nodule.

Upper abdomen: There is stable chronic calcification of the spleen
consistent with auto infarction. No acute findings identified.

Musculoskeletal/Chest wall: No chest wall lesion or acute osseous
findings.Chronic osseous changes of sickle cell in both humeral
heads an the thoracic spine are stable.

Review of the MIP images confirms the above findings.
IMPRESSION: 1. Tiny focus of nonocclusive thromboembolic disease in the left
upper lobe. No central pulmonary emboli demonstrated.
2. Stable chronic dilatation of the pulmonary arteries and right
cardiac chambers consistent with pulmonary arterial hypertension. No
evidence of acute right heart strain.
3. Stable chronic lung disease.
4. Stable chronic sequela of sickle cell disease.
5. Critical Value/emergent results were called by telephone at the
time of interpretation on 01/01/2014 at [DATE] to Dr. Brack Tiger, who
verbally acknowledged these results.

## 2016-11-24 IMAGING — DX DG CHEST 1V PORT
1 series · 1 of 1 positions shown · non-contrast
Comparison: Radiographs 12/23/2013.  CT 11/25/2013.

CLINICAL DATA: Shortness of breath with hypoxia.  Sickle cell pain.

EXAM:
PORTABLE CHEST - 1 VIEW

[AP]
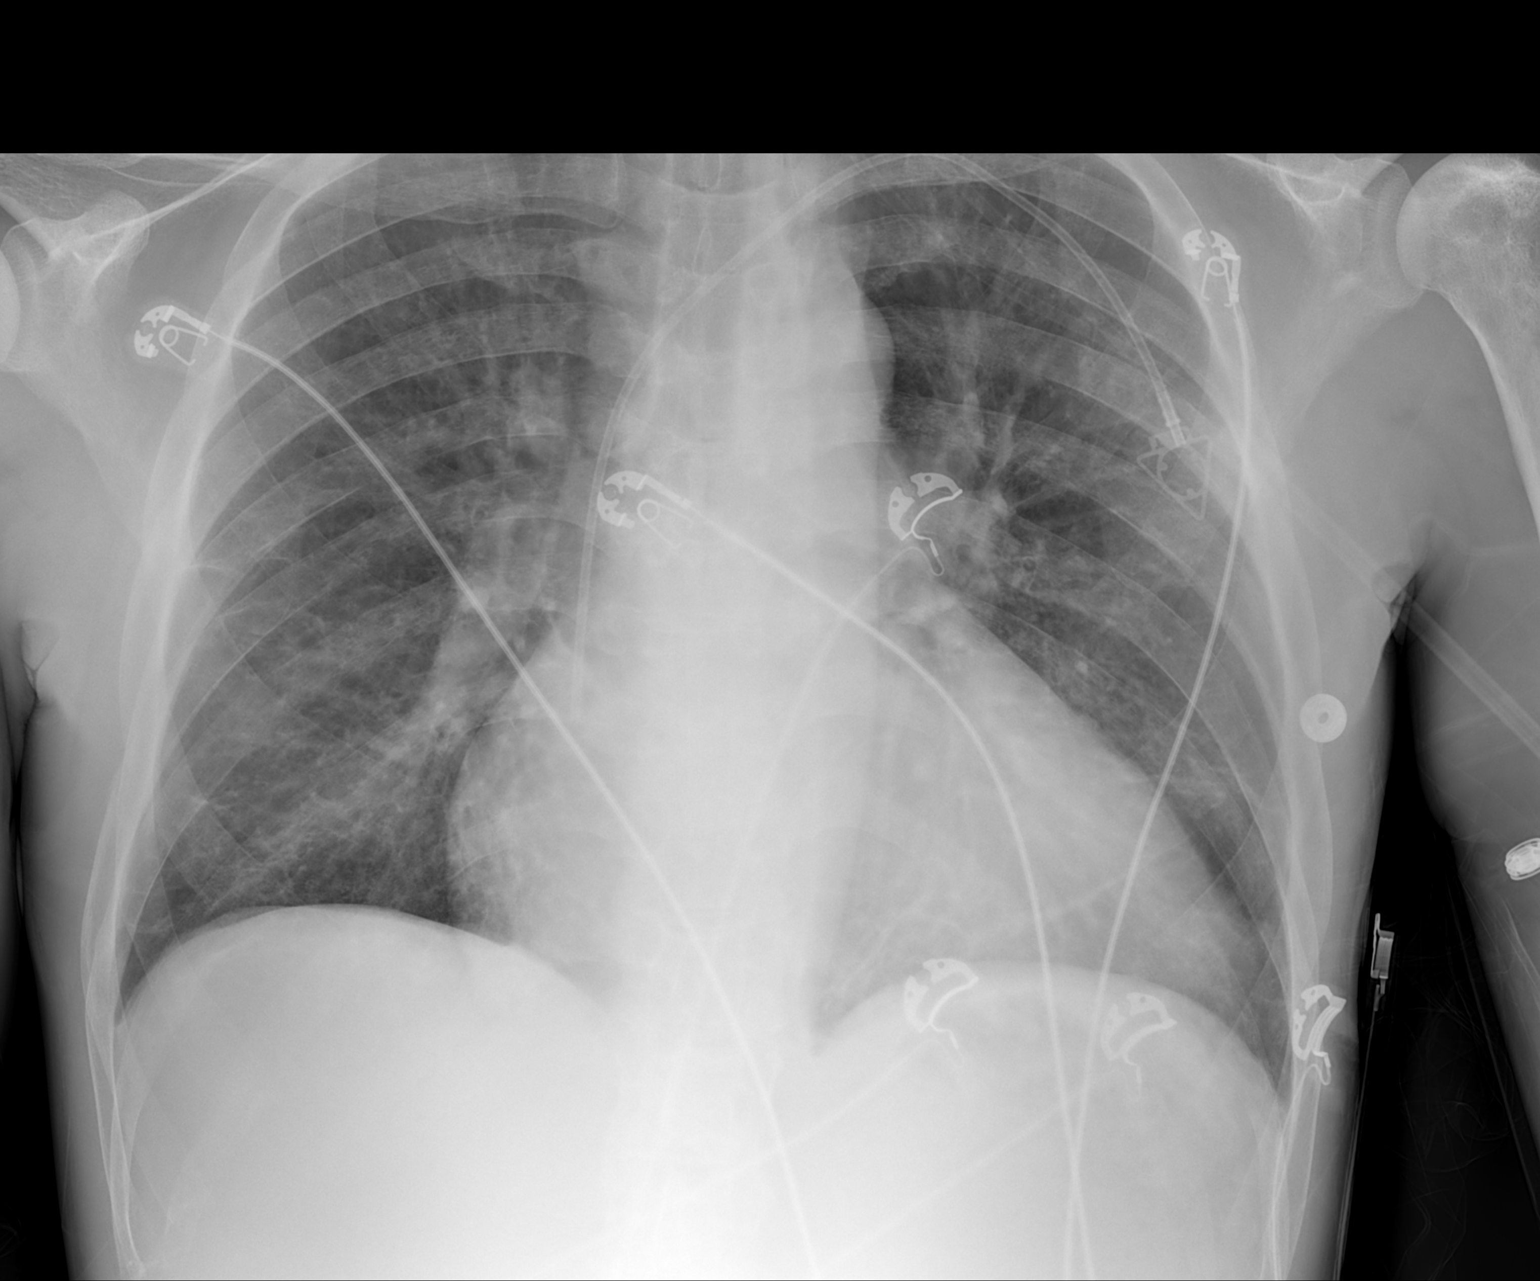

[1 of 1 positions shown; findings below may reference images not displayed]

FINDINGS: 6641 hr. The left subclavian Port-A-Cath is unchanged at the SVC
right atrial junction. Cardiomegaly and chronic vascular congestion
are unchanged. There is minimally increased basilar atelectasis
compared with the most recent radiographs. There is no overt
pulmonary edema, confluent airspace opacity or pleural effusion.
Left humeral head avascular necrosis noted.
IMPRESSION: Stable cardiomegaly and chronic vascular congestion. Minimally
increased bibasilar atelectasis without focal airspace disease.

## 2016-11-25 IMAGING — DX DG CHEST 1V PORT
1 series · 1 of 1 positions shown · non-contrast
Comparison: CT 12 2 6320.  Chest x-ray 12/23/2013.

CLINICAL DATA: Hypoxia.

EXAM:
PORTABLE CHEST - 1 VIEW

[chest ap]
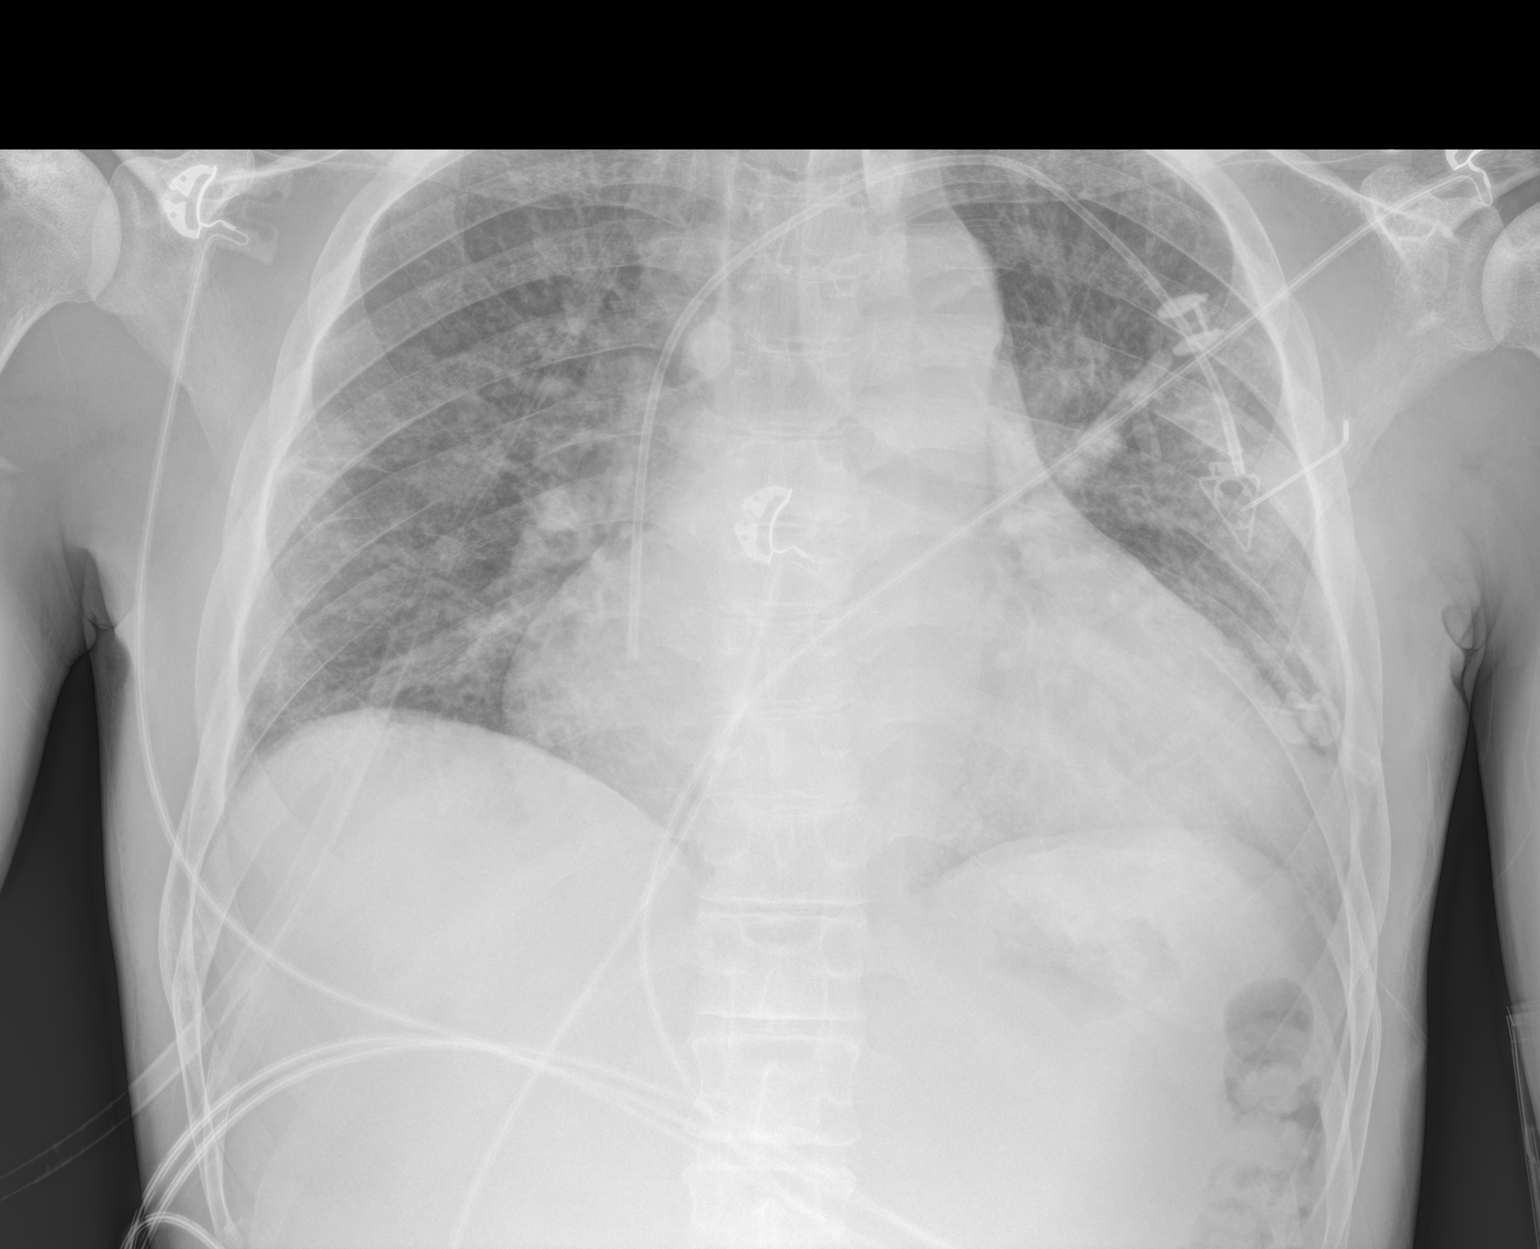

[1 of 1 positions shown; findings below may reference images not displayed]

FINDINGS: Power port catheter in good anatomic position. Severe cardiomegaly.
Pulmonary venous congestion mild interstitial prominence suggesting
congestive heart failure. Underlying chronic interstitial lung
disease. No pleural effusion or pneumothorax. No acute bony
abnormality.
IMPRESSION: 1. Power port catheter in good anatomic position.
2. Congestive heart failure with interstitial edema. Underlying
chronic interstitial lung disease.

## 2016-12-27 IMAGING — CR DG CHEST 2V
2 series · 2 of 2 positions shown · non-contrast
Comparison: 01/02/2014

CLINICAL DATA: Hypoxia, cough, sickle cell disease, history
pulmonary embolism, smoking

EXAM:
CHEST  2 VIEW

[w chest pa]
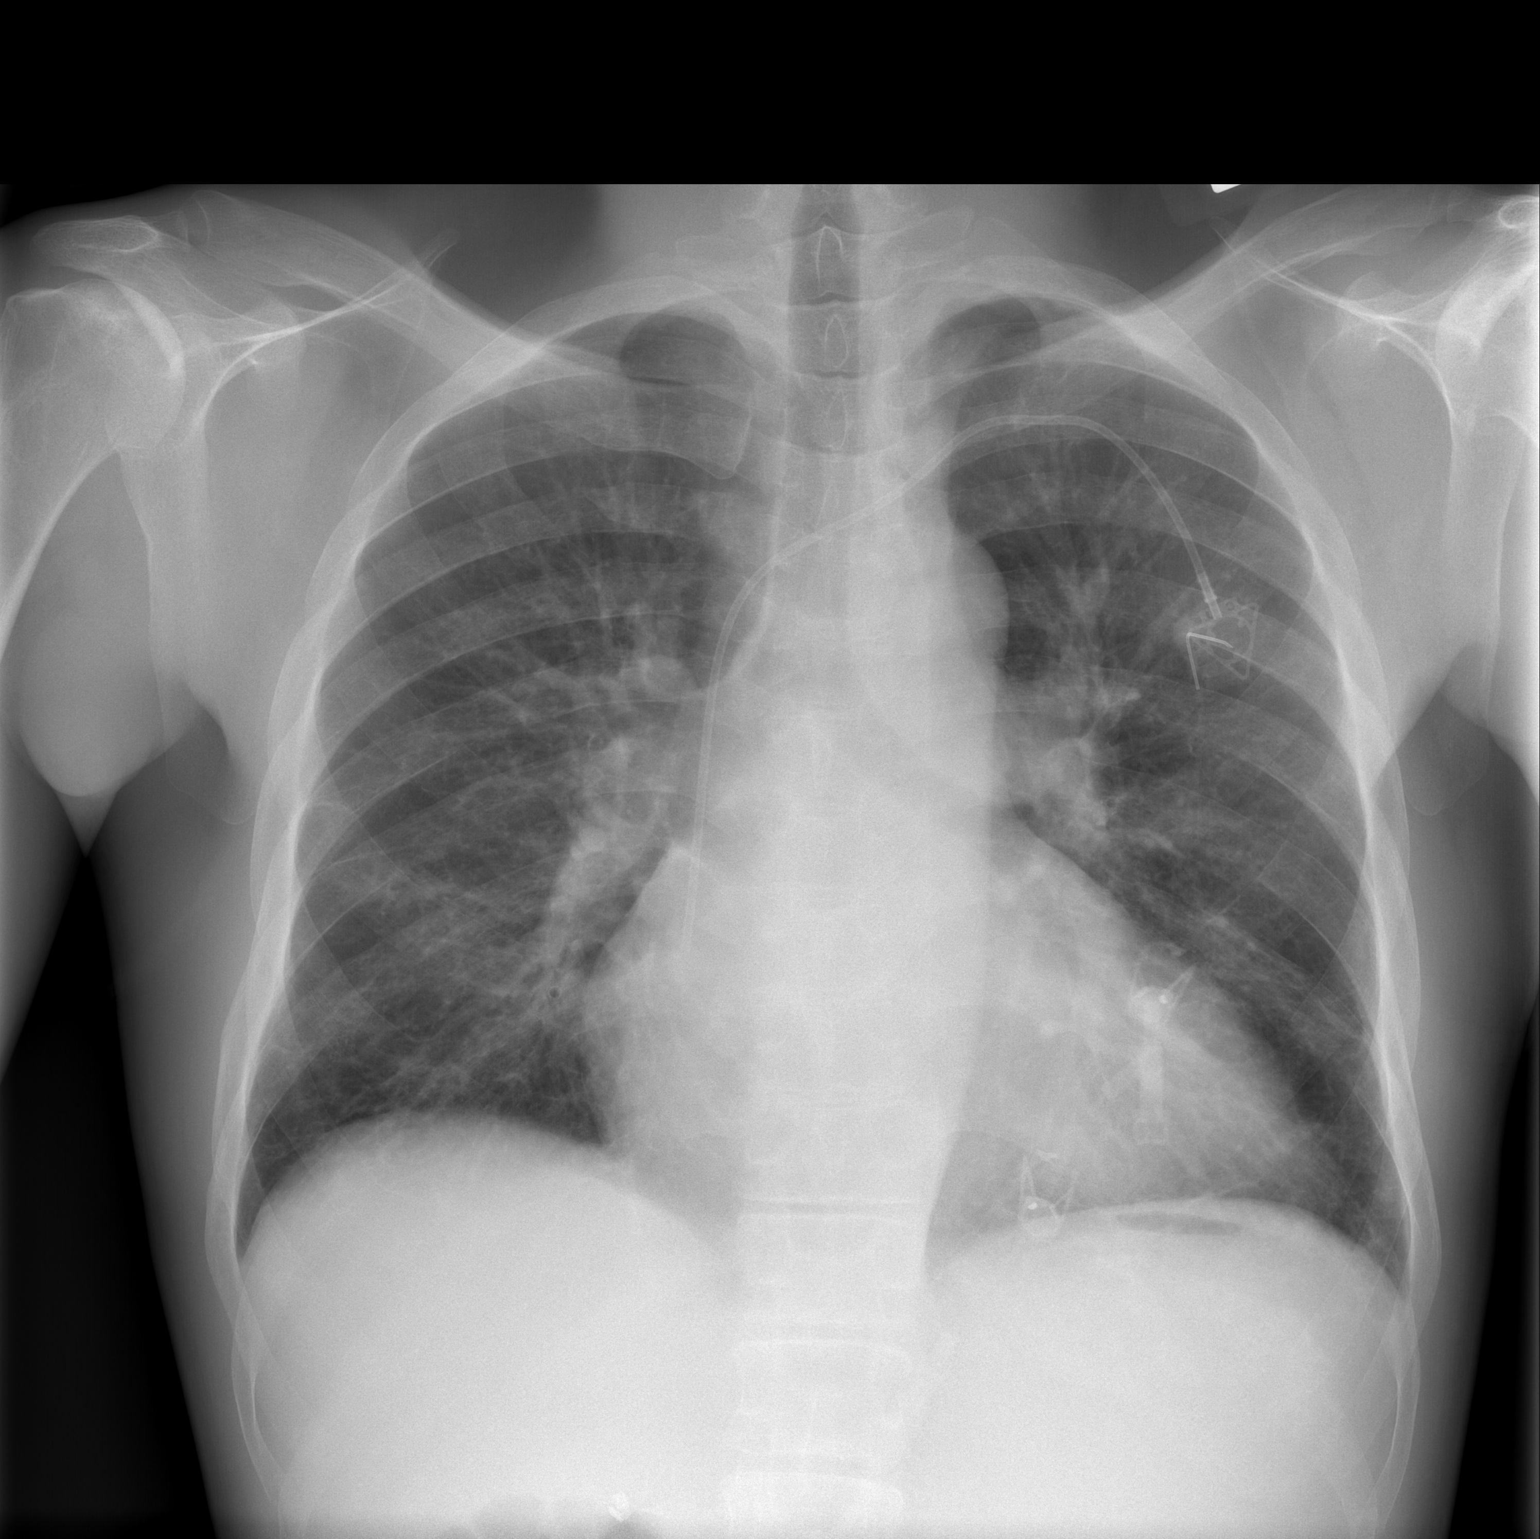

[w chest lat]
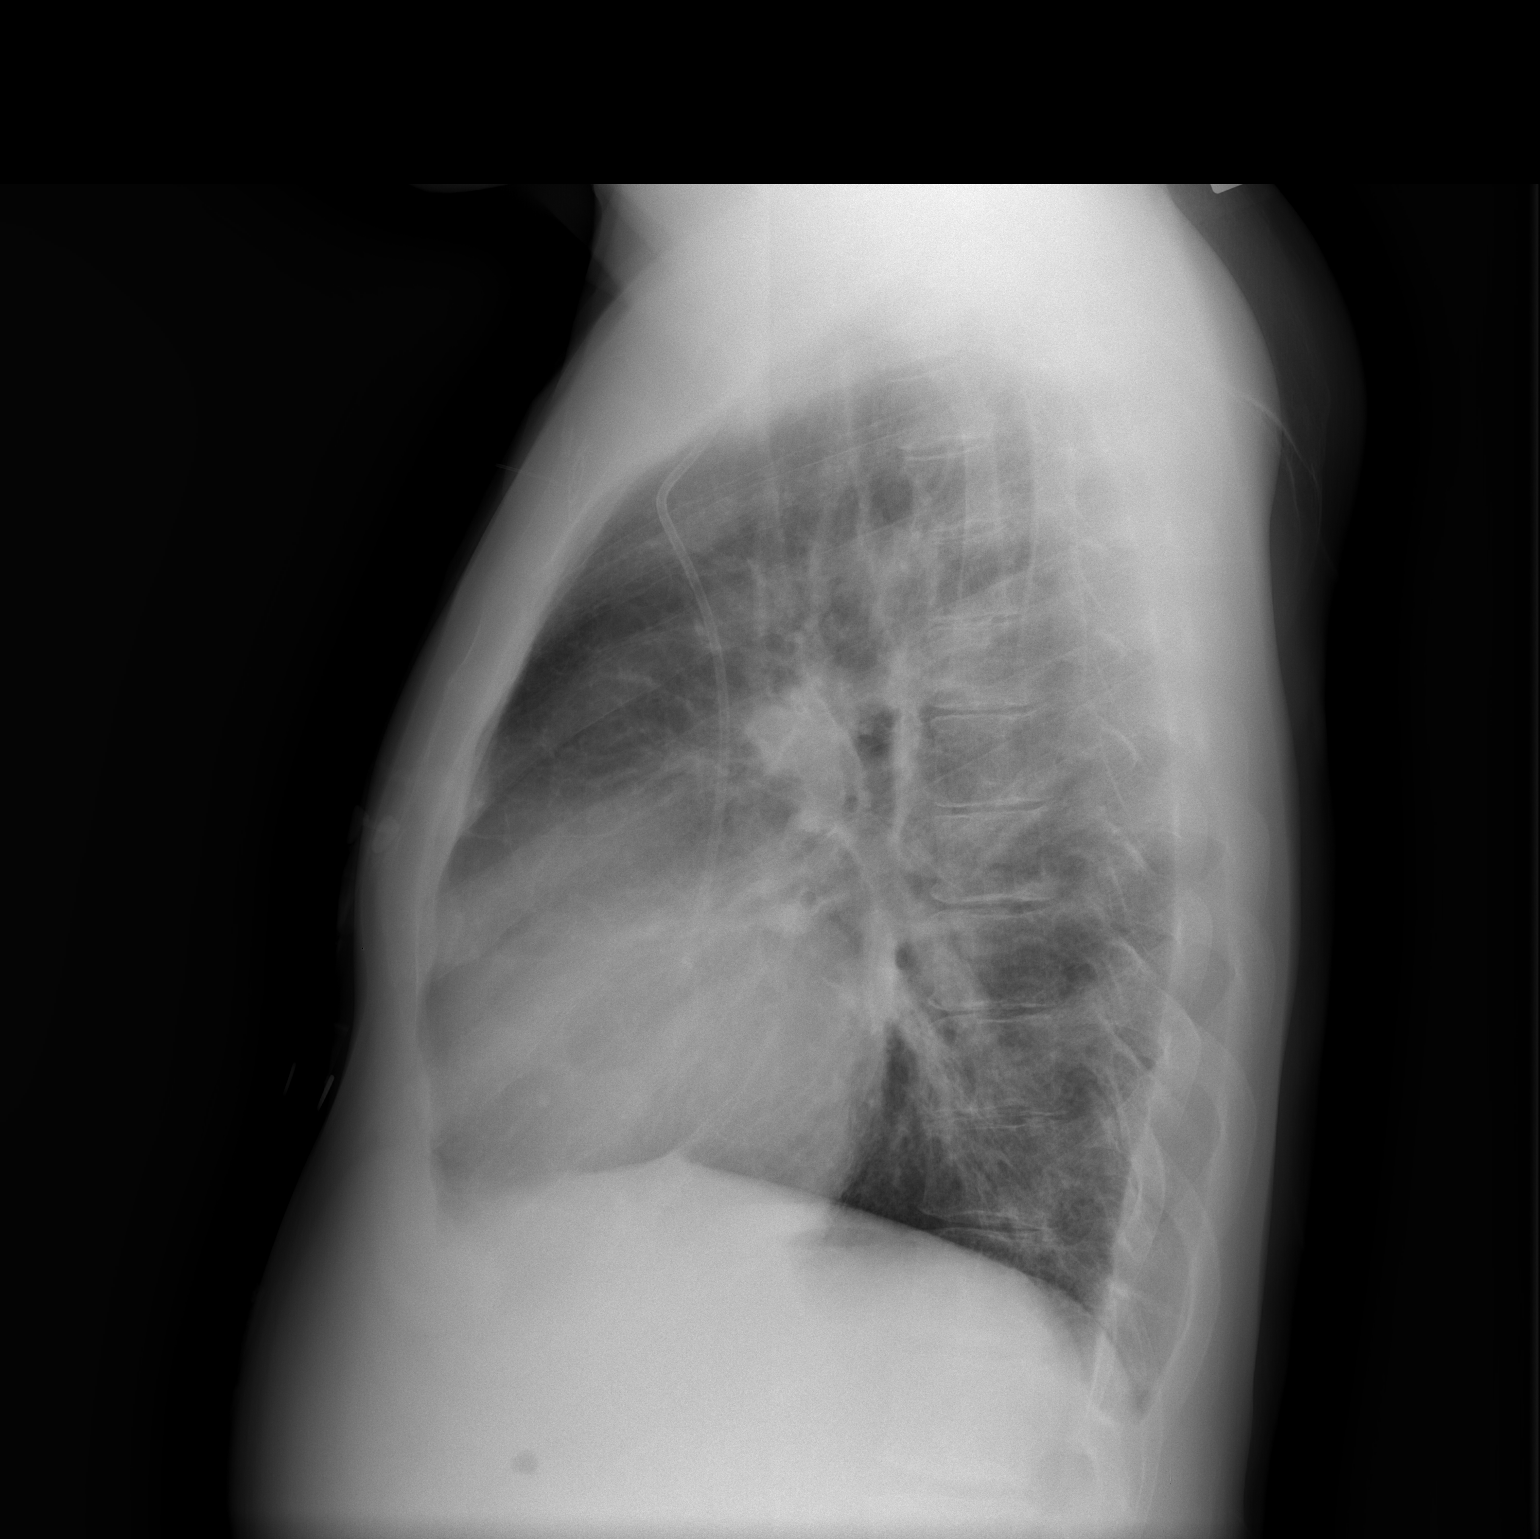

[2 of 2 positions shown; findings below may reference images not displayed]

FINDINGS: LEFT subclavian Port-A-Cath with tip projecting over cavoatrial
junction.

Enlargement of cardiac silhouette with pulmonary vascular
congestion.

Mediastinal contour stable.

Lungs clear.

No pleural effusion or pneumothorax.

Bones unremarkable.
IMPRESSION: Enlargement of cardiac silhouette with pulmonary vascular congestion
consistent with history of sickle cell disease.

No acute abnormalities.

## 2017-01-16 IMAGING — CR DG CHEST 2V
2 series · 2 of 2 positions shown · non-contrast
Comparison: 02/03/2014

CLINICAL DATA: Sickle cell crisis, cough, congestion

EXAM:
CHEST  2 VIEW

[w chest pa]
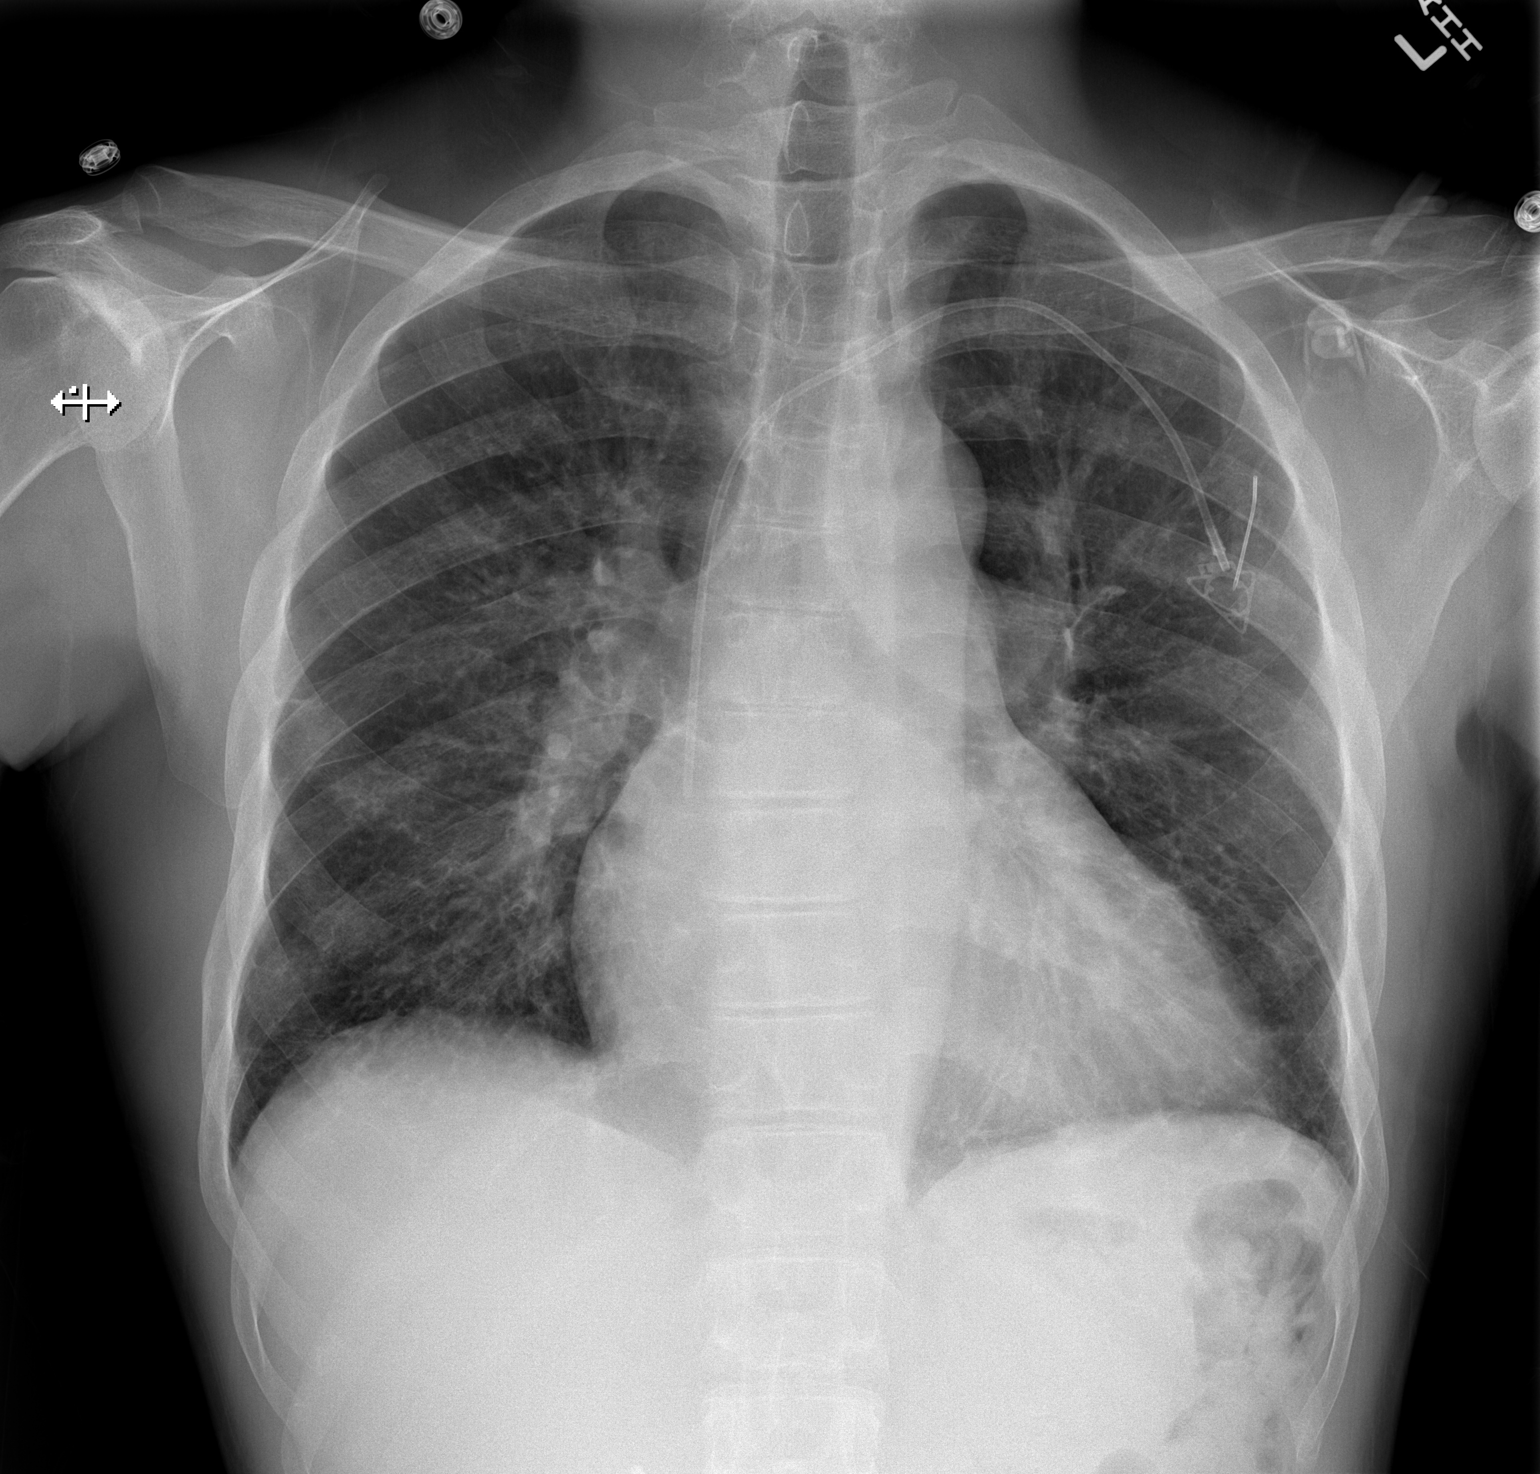

[w chest lat]
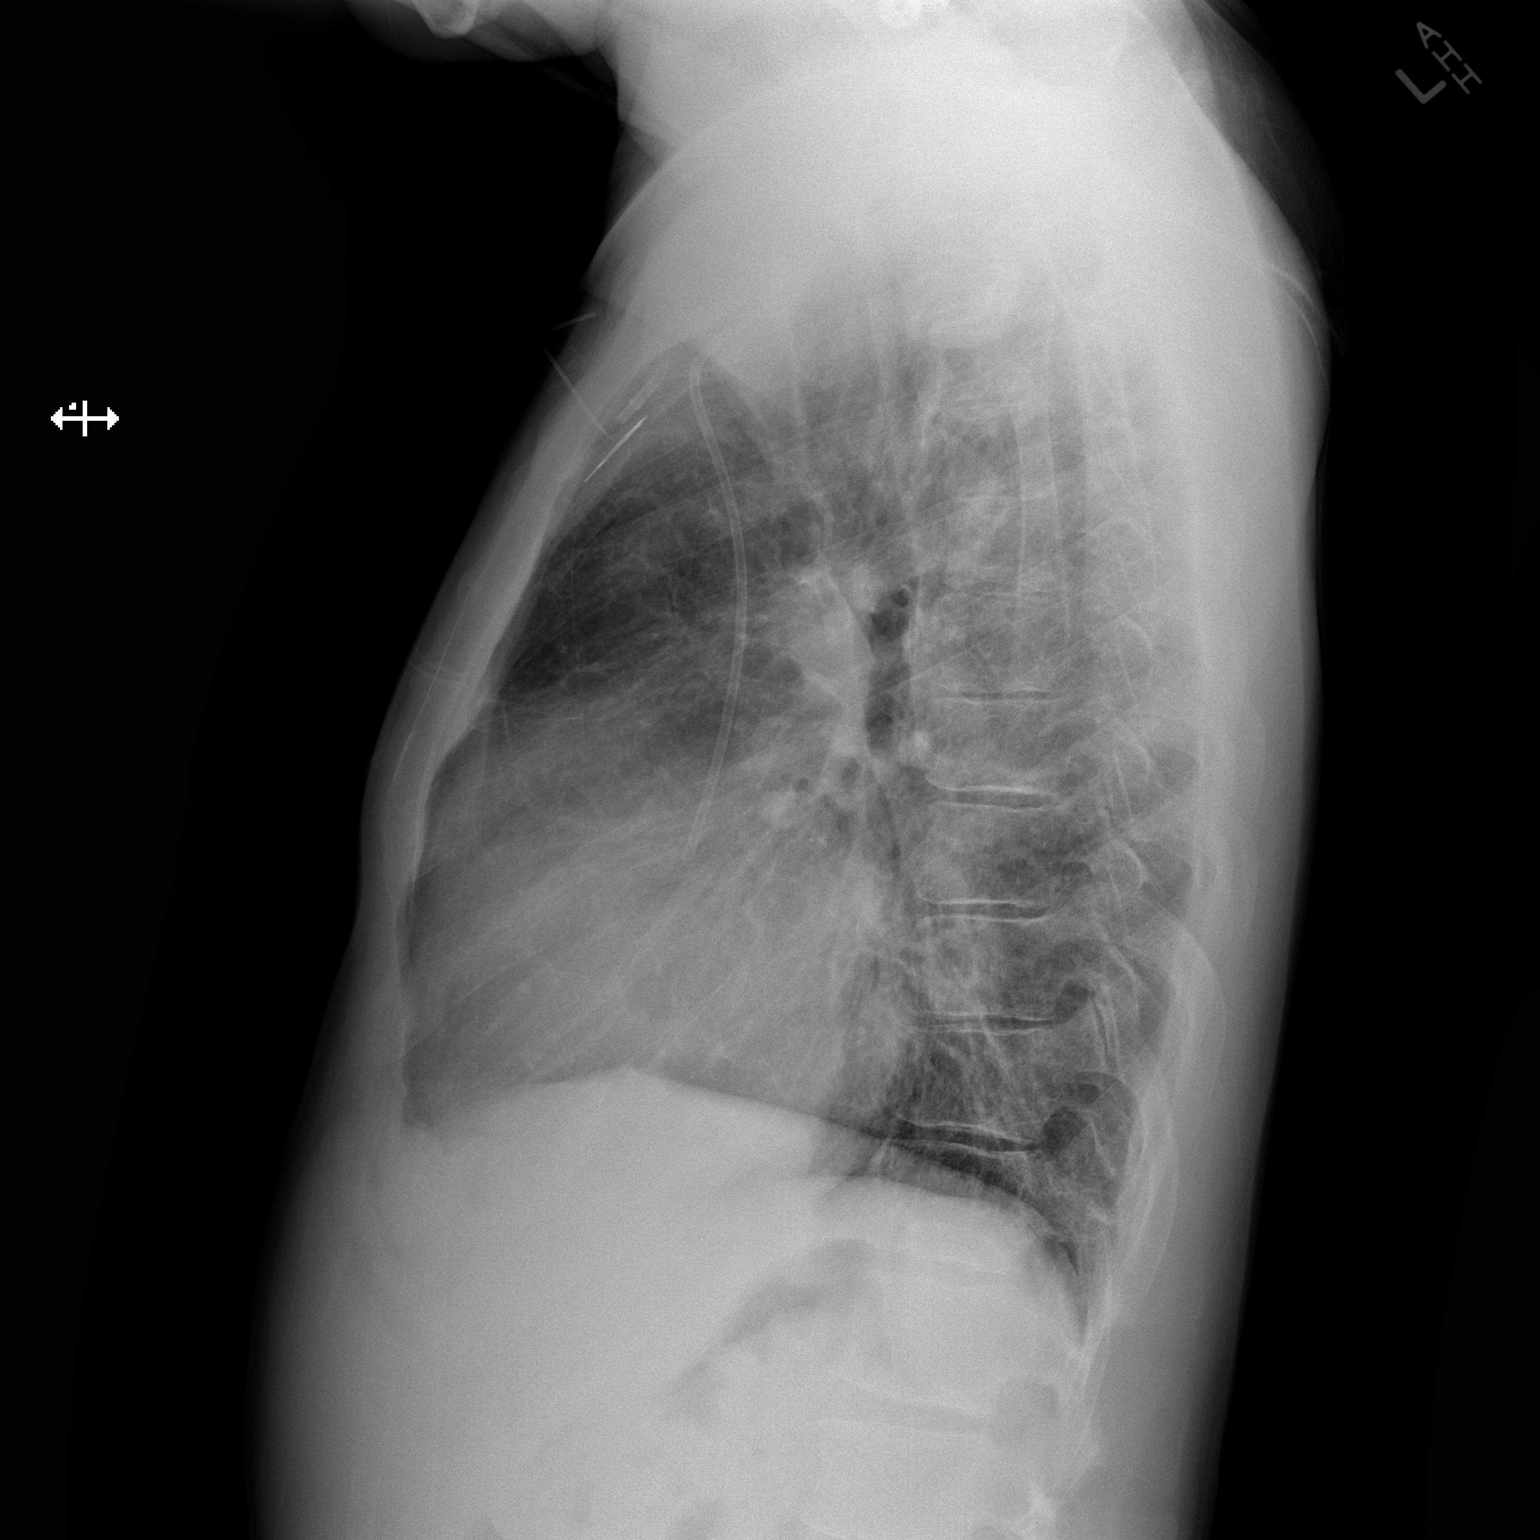

[2 of 2 positions shown; findings below may reference images not displayed]

FINDINGS: Borderline cardiomegaly again noted. Stable central mild vascular
congestion. No definite superimposed infiltrate or pulmonary edema.
Stable left Port-A-Cath position. Bony thorax is stable.
IMPRESSION: Borderline cardiomegaly. Central vascular congestion without
convincing pulmonary edema. No segmental infiltrate.

## 2017-01-27 IMAGING — CR DG CHEST 2V
2 series · 2 of 2 positions shown · non-contrast
Comparison: Chest radiograph February 23, 2014

CLINICAL DATA: Sickle cell crisis, shortness of breath, upper chest
pain for 48 hr.

EXAM:
CHEST  2 VIEW

[w chest pa]
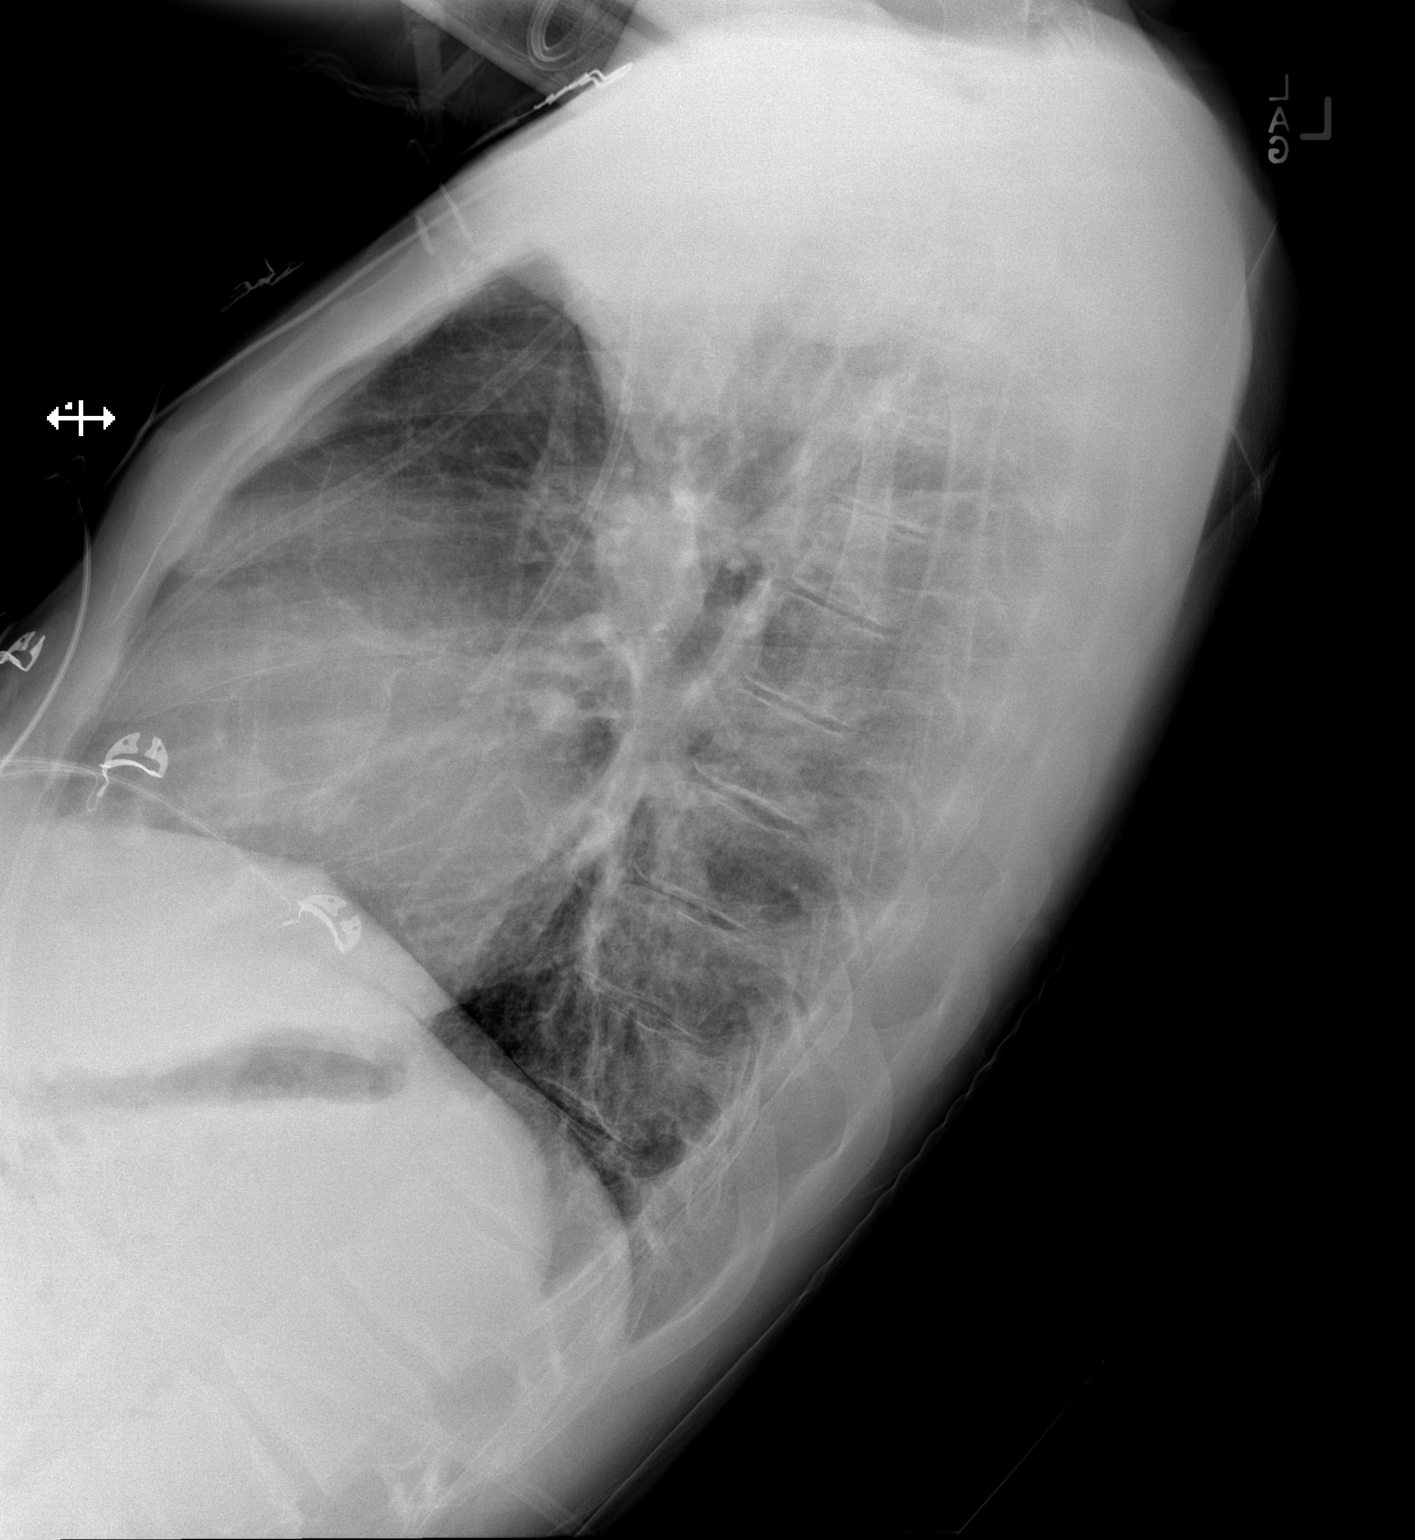

[x chest ap]
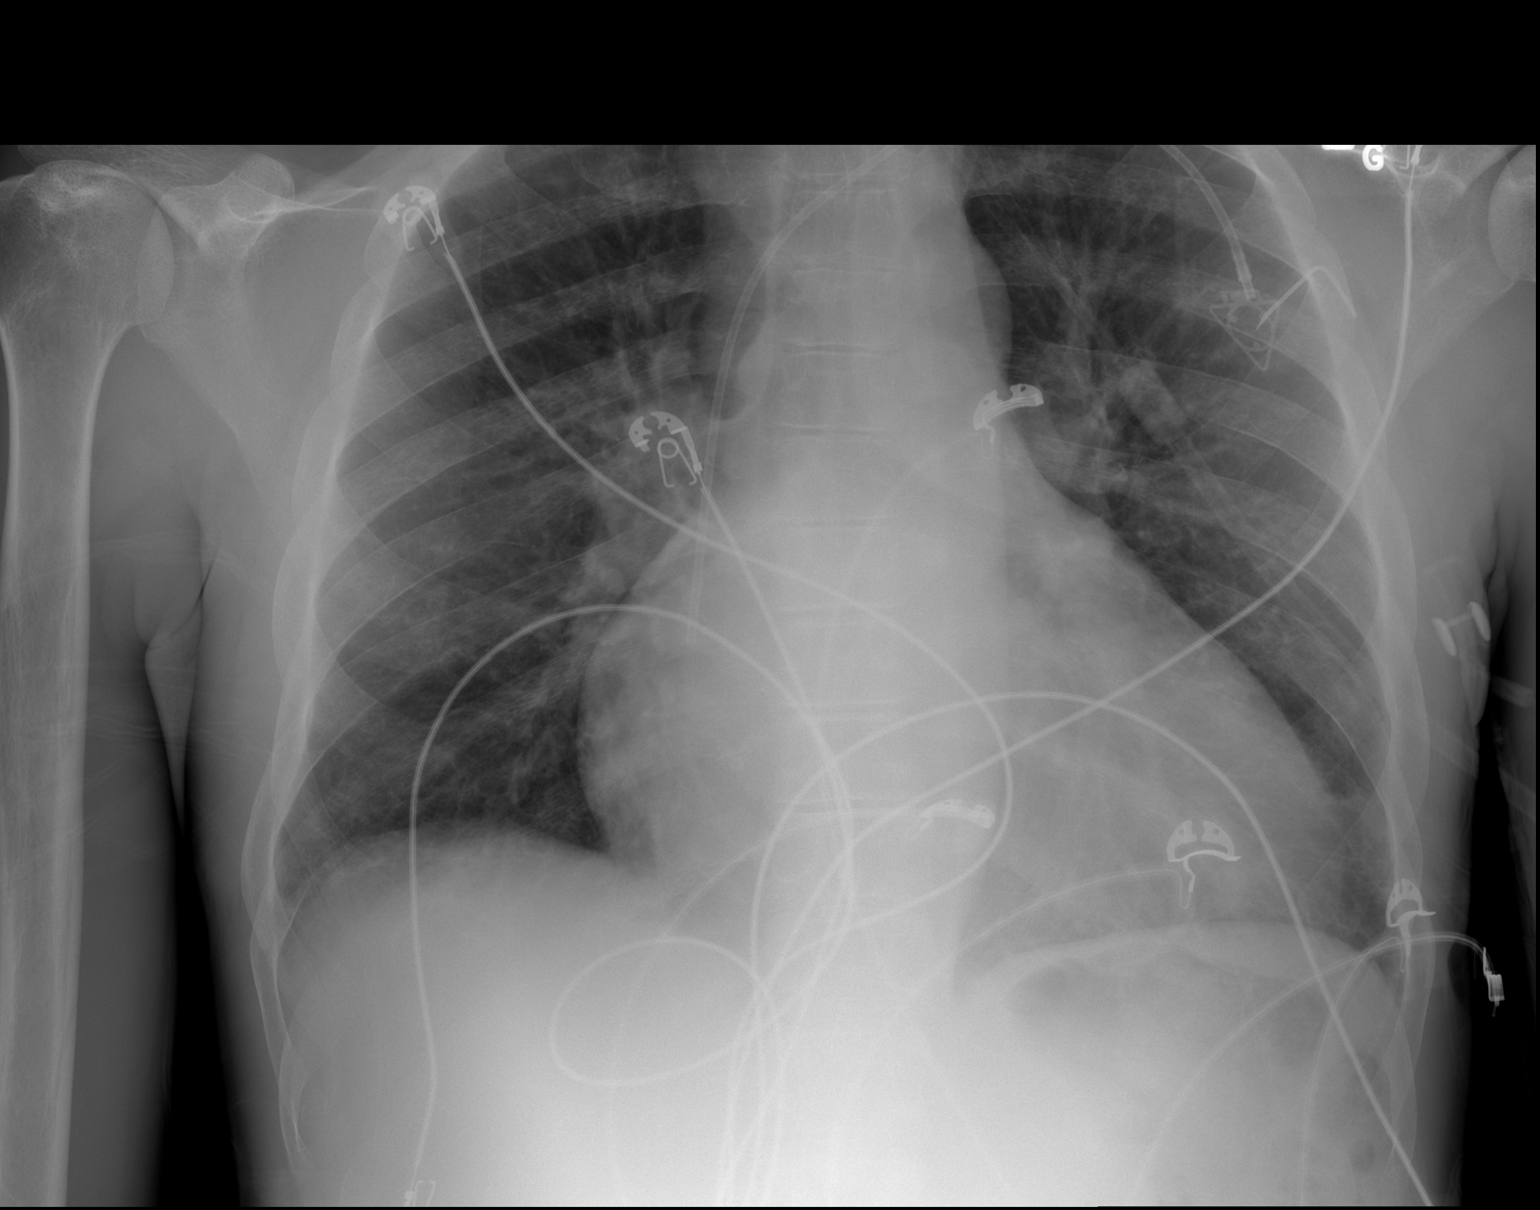

[2 of 2 positions shown; findings below may reference images not displayed]

FINDINGS: The cardiac silhouette is mild-to-moderately enlarged. Pulmonary
vascular congestion, increased without pleural effusion or focal
consolidation. No pneumothorax.

LEFT chest Port-A-Cath with distal tip projecting cavoatrial
junction. No pneumothorax. Soft tissue planes and included osseous
structures are nonacute.
IMPRESSION: Mild cardiomegaly and pulmonary vascular congestion without focal
consolidation.

  By: Mickael Bel

## 2017-02-01 IMAGING — CT CT ANGIO CHEST
2 of 6 series · 18 of 36 positions shown · IV contrast (OMNIPAQUE)
Comparison: 01/01/2014

CLINICAL DATA: Sickle cell disease with sudden onset left-sided
chest pain.

EXAM:
CT ANGIOGRAPHY CHEST WITH CONTRAST
TECHNIQUE: Multidetector CT imaging of the chest was performed using the
standard protocol during bolus administration of intravenous
contrast. Multiplanar CT image reconstructions and MIPs were
obtained to evaluate the vascular anatomy.
CONTRAST:  100mL OMNIPAQUE IOHEXOL 350 MG/ML SOLN

[Series 7: pe thins @ 1mm · axial · 0.69mm/px · z∈[-282,-14]mm · 17 of 298 slices shown]
[im 15/298  lung]
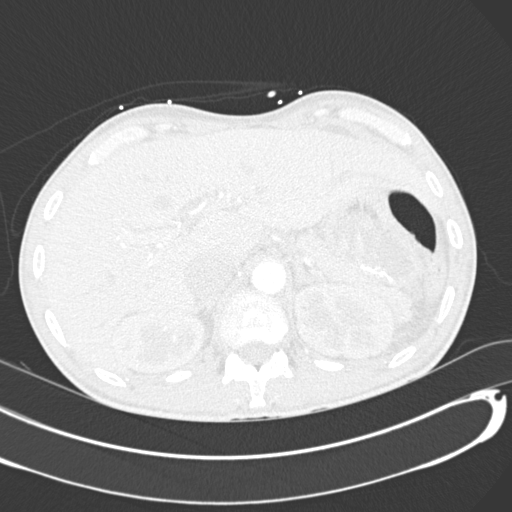
[im 30/298  mediastinal]
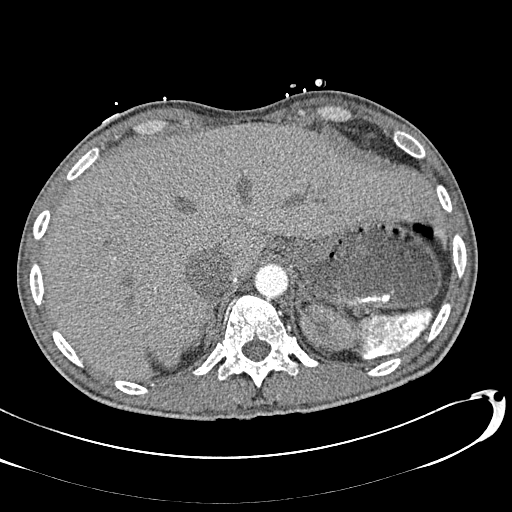
[im 45/298  lung]
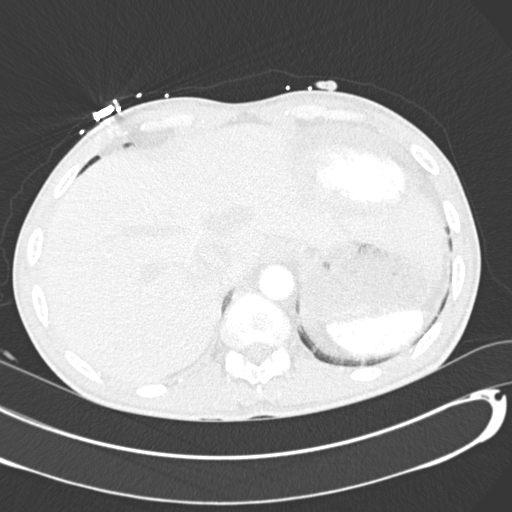
[im 60/298  mediastinal]
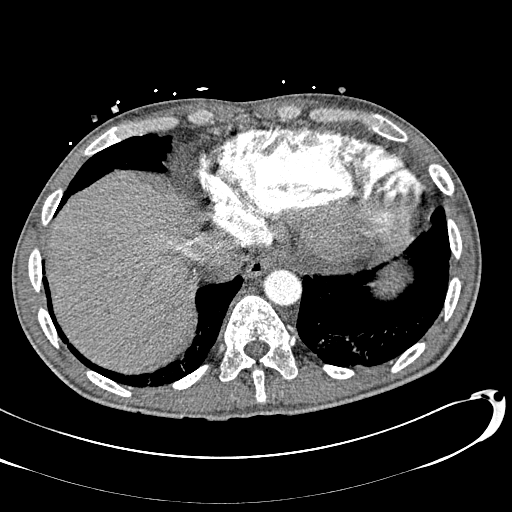
[im 90/298  lung]
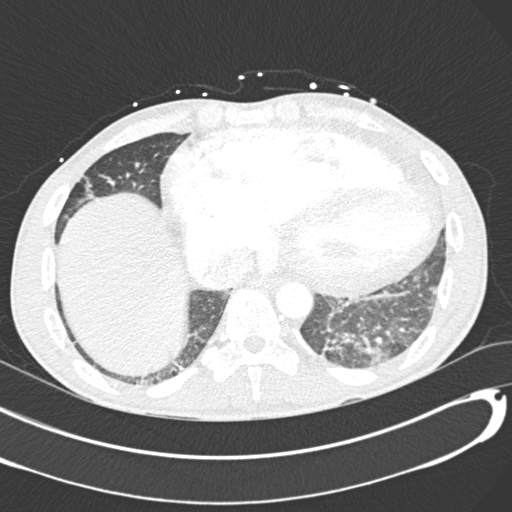
[im 104/298  mediastinal]
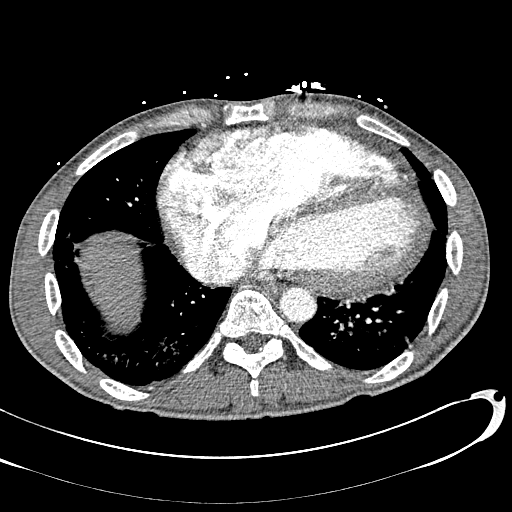
[im 119/298  lung]
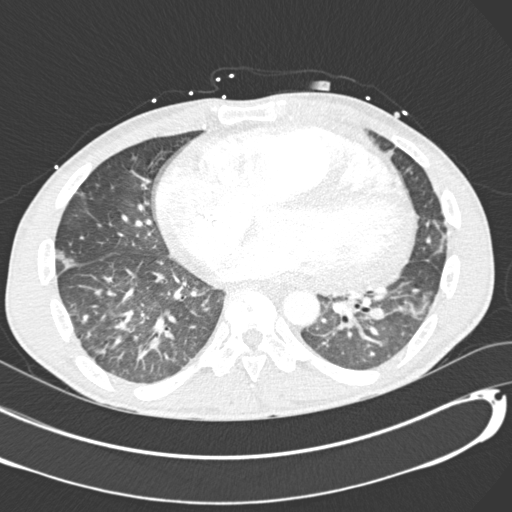
[im 134/298  mediastinal]
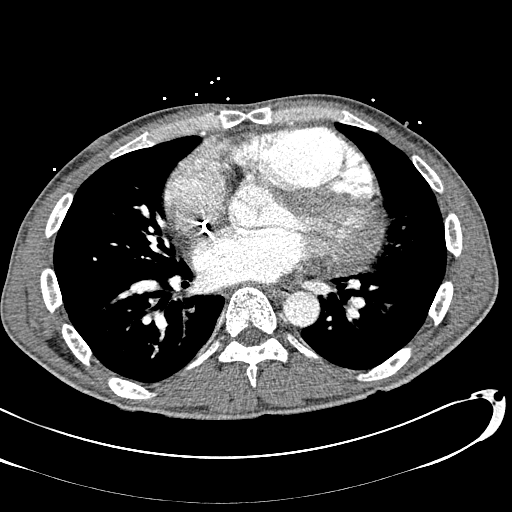
[im 149/298  lung]
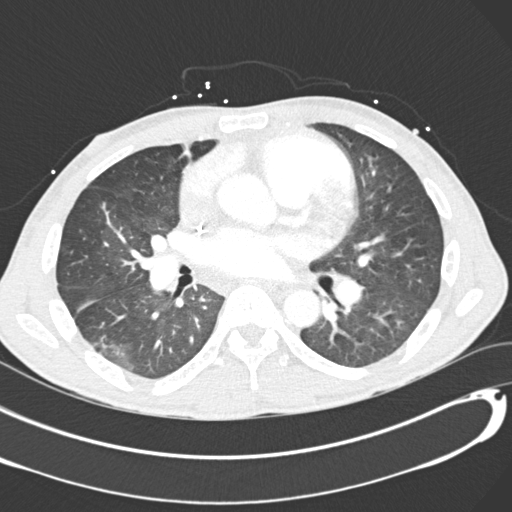
[im 164/298  mediastinal]
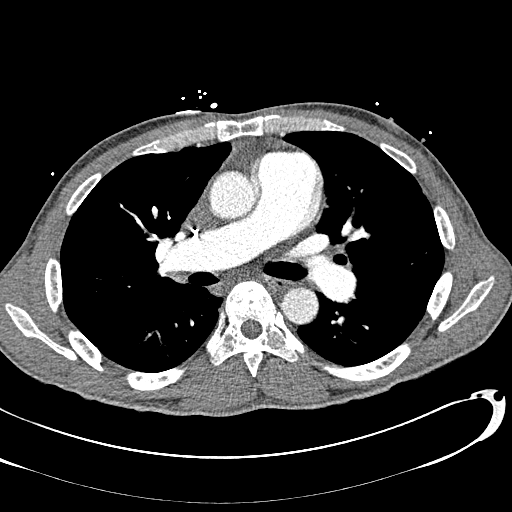
[im 179/298  lung]
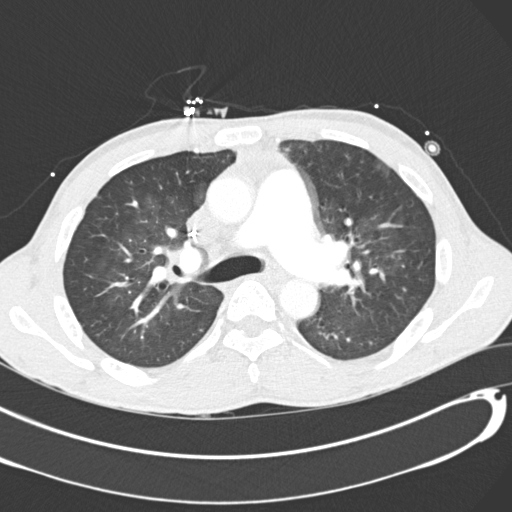
[im 194/298  mediastinal]
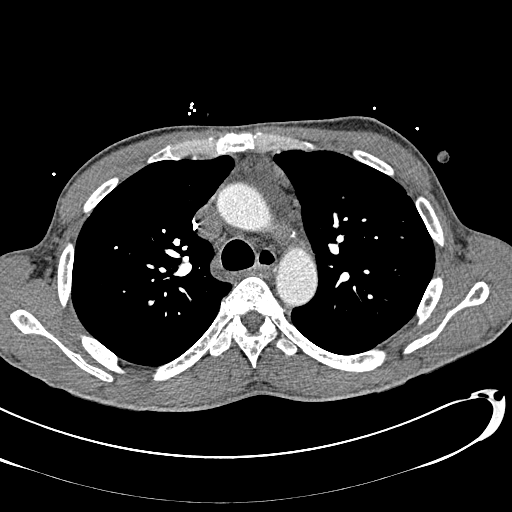
[im 208/298  lung]
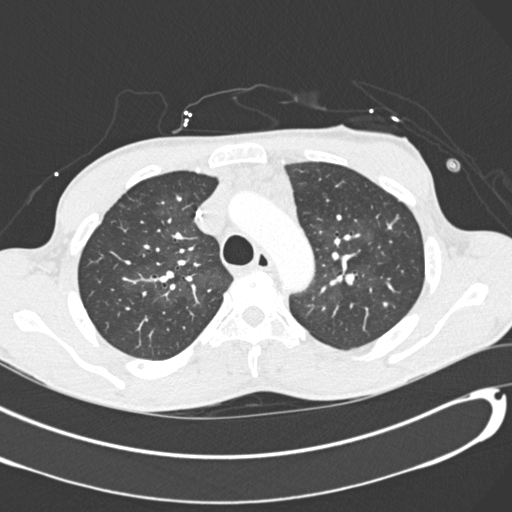
[im 238/298  mediastinal]
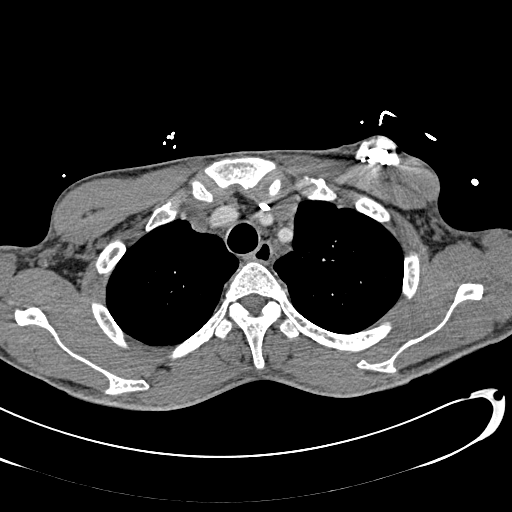
[im 253/298  lung]
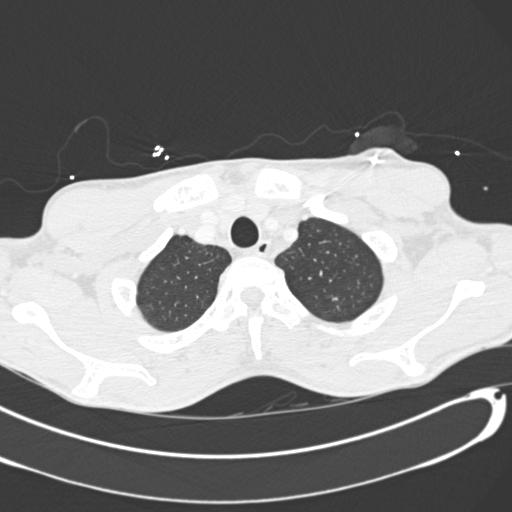
[im 268/298  mediastinal]
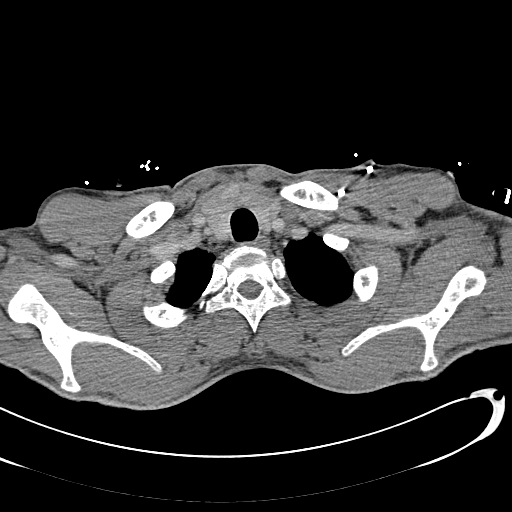
[im 283/298  lung]
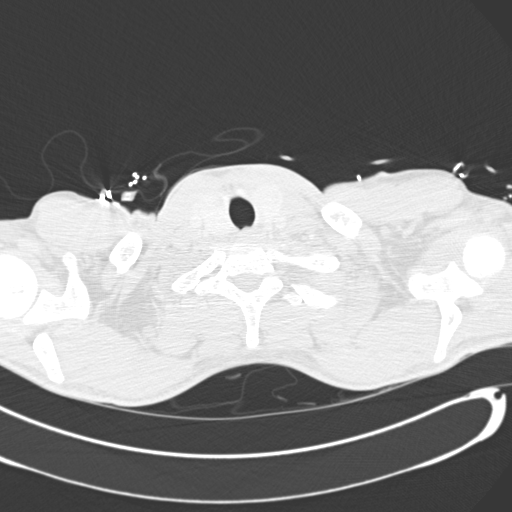

[Series 602: <mpr thick range> · coronal · 0.69mm/px · 1 of 115 slices shown]
[im 58/115  mediastinal]
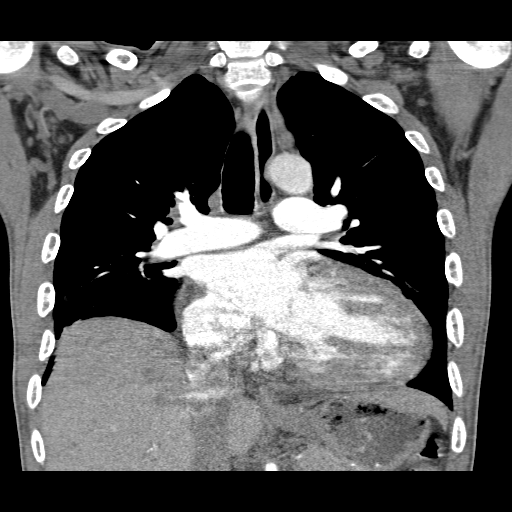

[18 of 36 positions shown; findings below may reference images not displayed]

FINDINGS: THORACIC INLET/BODY WALL:

Left subclavian porta catheter, tip in the right atrium.

MEDIASTINUM:

There is multi focal pulmonary embolism with clot primarily
subsegmental but intermittently occlusive. Clot in the posterior
segment left upper lobe on image 92, and in the left lower lobe on
image 175 and 183 is chronic and without progression from
01/01/2014. The lower lobe clot was previously obscured by artifact,
but with the benefit of the current scan was present. There is acute
pulmonary embolism to the right upper and right lower lobes,
visualized on images 117, 164 and 177.

There is chronic cardiomegaly and right heart enlargement, which
limits utility of the RV to LV ratio. The main pulmonary artery is
chronically dilated at 37 mm. There is no progression of these
findings to suggest acute right heart failure. No pericardial
effusion. No aortic dissection. There is chronic mild enlargement of
upper mediastinal lymph nodes.

LUNG WINDOWS:

Patchy ground-glass density in the bilateral lungs, with lower lung
subsegmental atelectasis or scarring that is stable from prior and
likely fixed, chronic lung disease. No lung infarct noted.

UPPER ABDOMEN:

Splenic auto infarction.

OSSEOUS:

Typical osseous changes of sickle cell disease.

Critical Value/emergent results were called by telephone at the time
of interpretation on 03/11/2014 at [DATE] to Dr. MONOM HIRARI
, who verbally acknowledged these results.

Review of the MIP images confirms the above findings.
IMPRESSION: Acute on chronic pulmonary embolism with diffuse subsegmental clot.
Chronic pulmonary artery hypertension; no morphologic change to
suggest acute right heart failure.

## 2017-02-01 IMAGING — CR DG CHEST 2V
2 series · 2 of 2 positions shown · non-contrast
Comparison: Chest radiograph performed 03/06/2014

CLINICAL DATA: Acute onset of shortness of breath. Decreased O2
saturation. Generalized weakness and pain. Initial encounter.

EXAM:
CHEST  2 VIEW

[x chest ap]
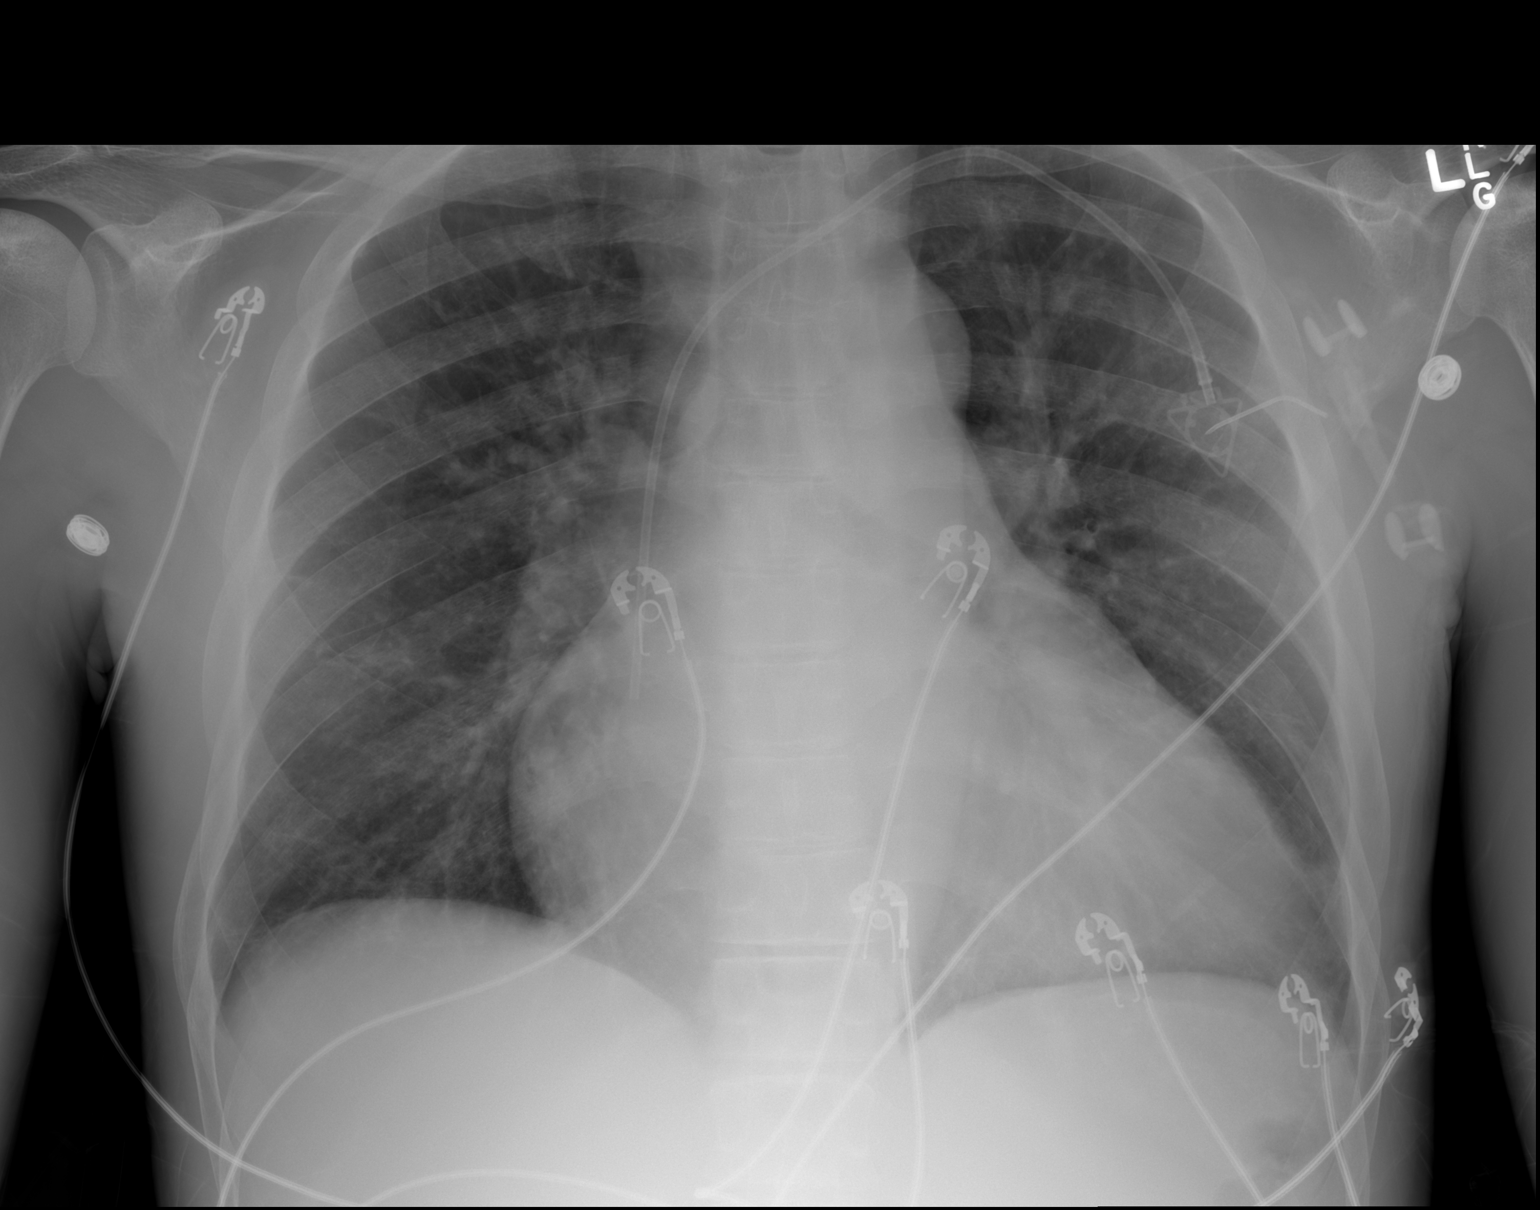

[w chest lat]
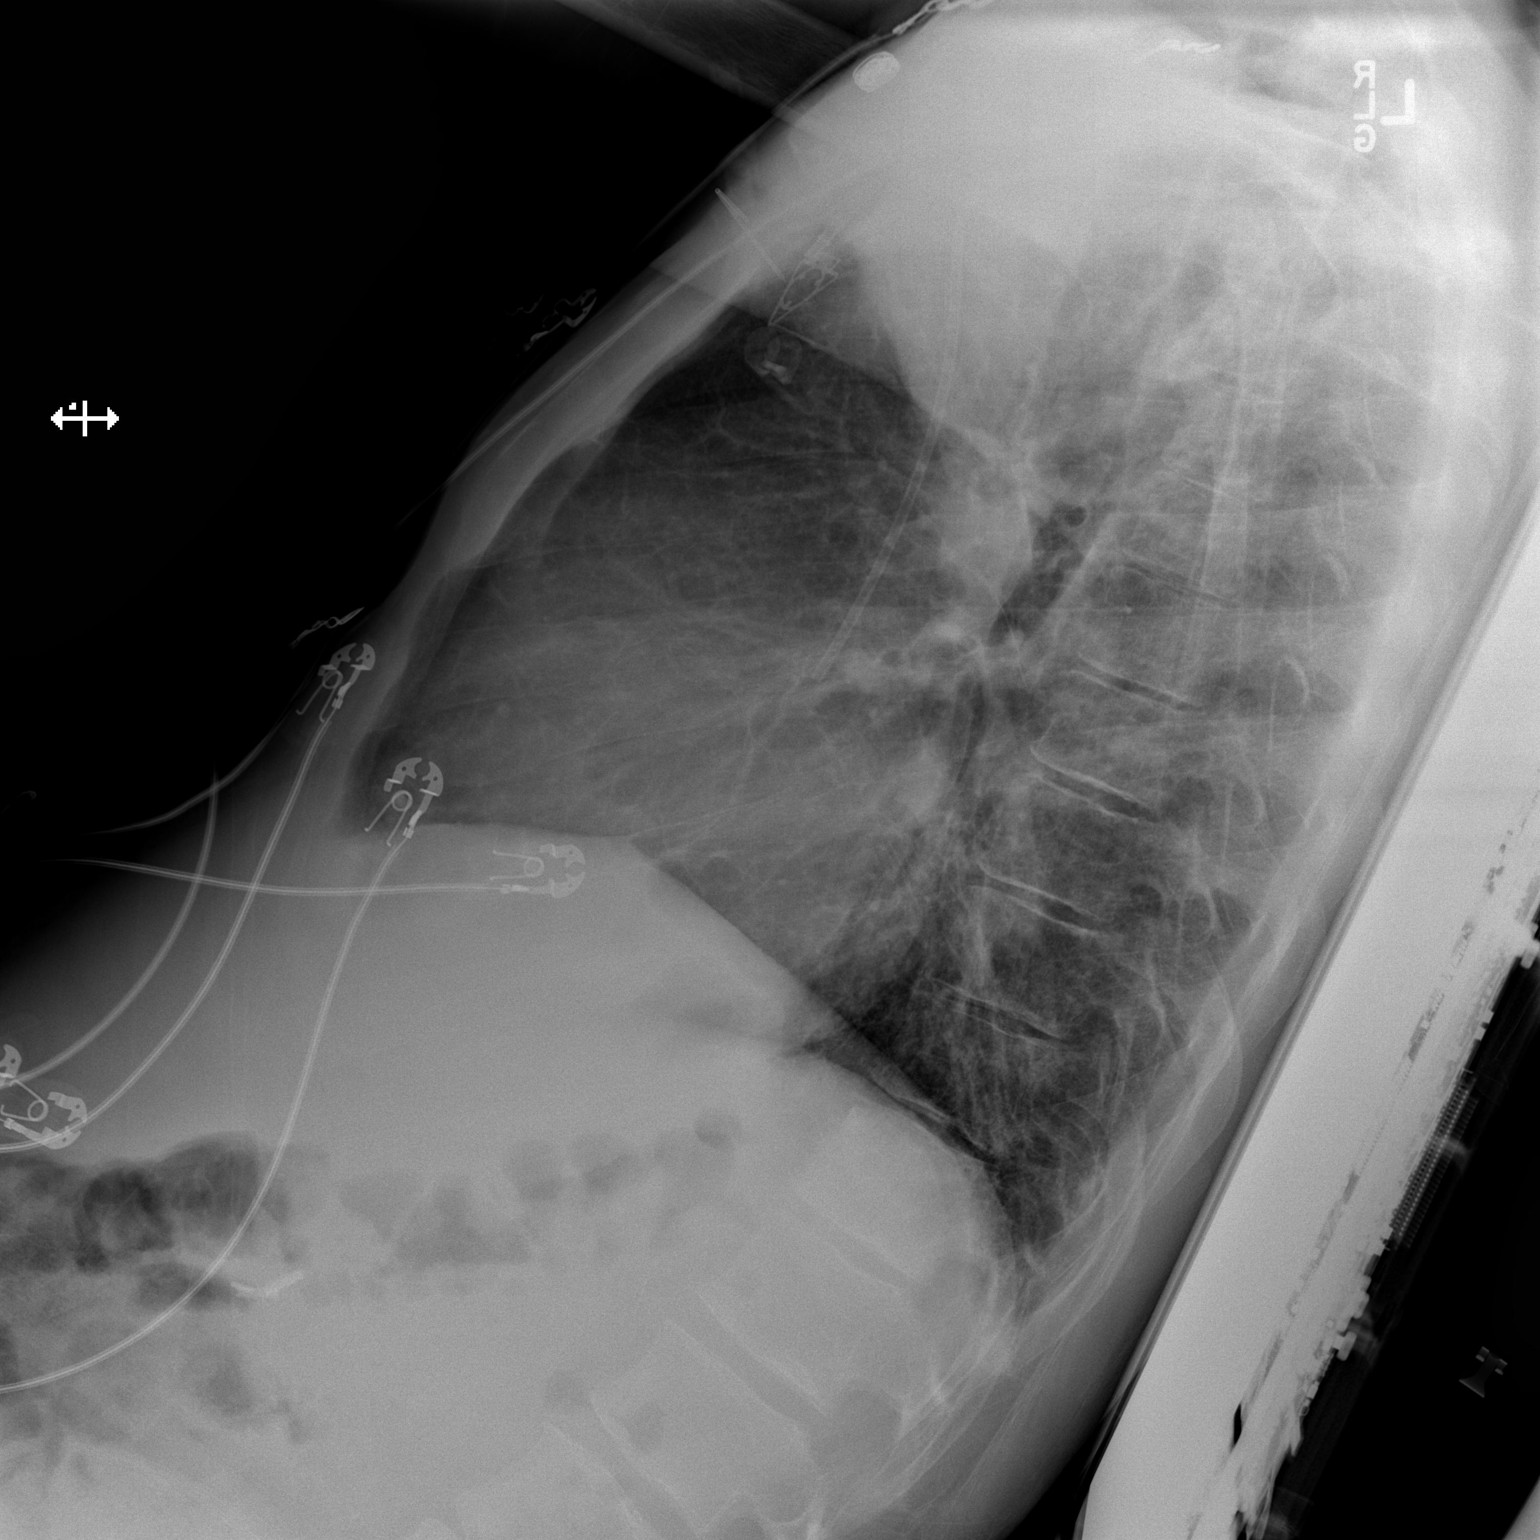

[2 of 2 positions shown; findings below may reference images not displayed]

FINDINGS: The lungs are well-aerated. Vascular congestion is noted. Minimal
bilateral atelectasis is seen. There is no evidence of pleural
effusion or pneumothorax.

The heart is mildly enlarged. A left-sided chest port is noted
ending about the cavoatrial junction. No acute osseous abnormalities
are seen.
IMPRESSION: Vascular congestion and mild cardiomegaly. Minimal bilateral
atelectasis seen.

## 2017-02-04 IMAGING — CR DG CHEST 2V
2 series · 2 of 2 positions shown · non-contrast
Comparison: 03/11/2014

CLINICAL DATA: Dizzy and weak, history of sickle cell

EXAM:
CHEST  2 VIEW

[w chest lat]
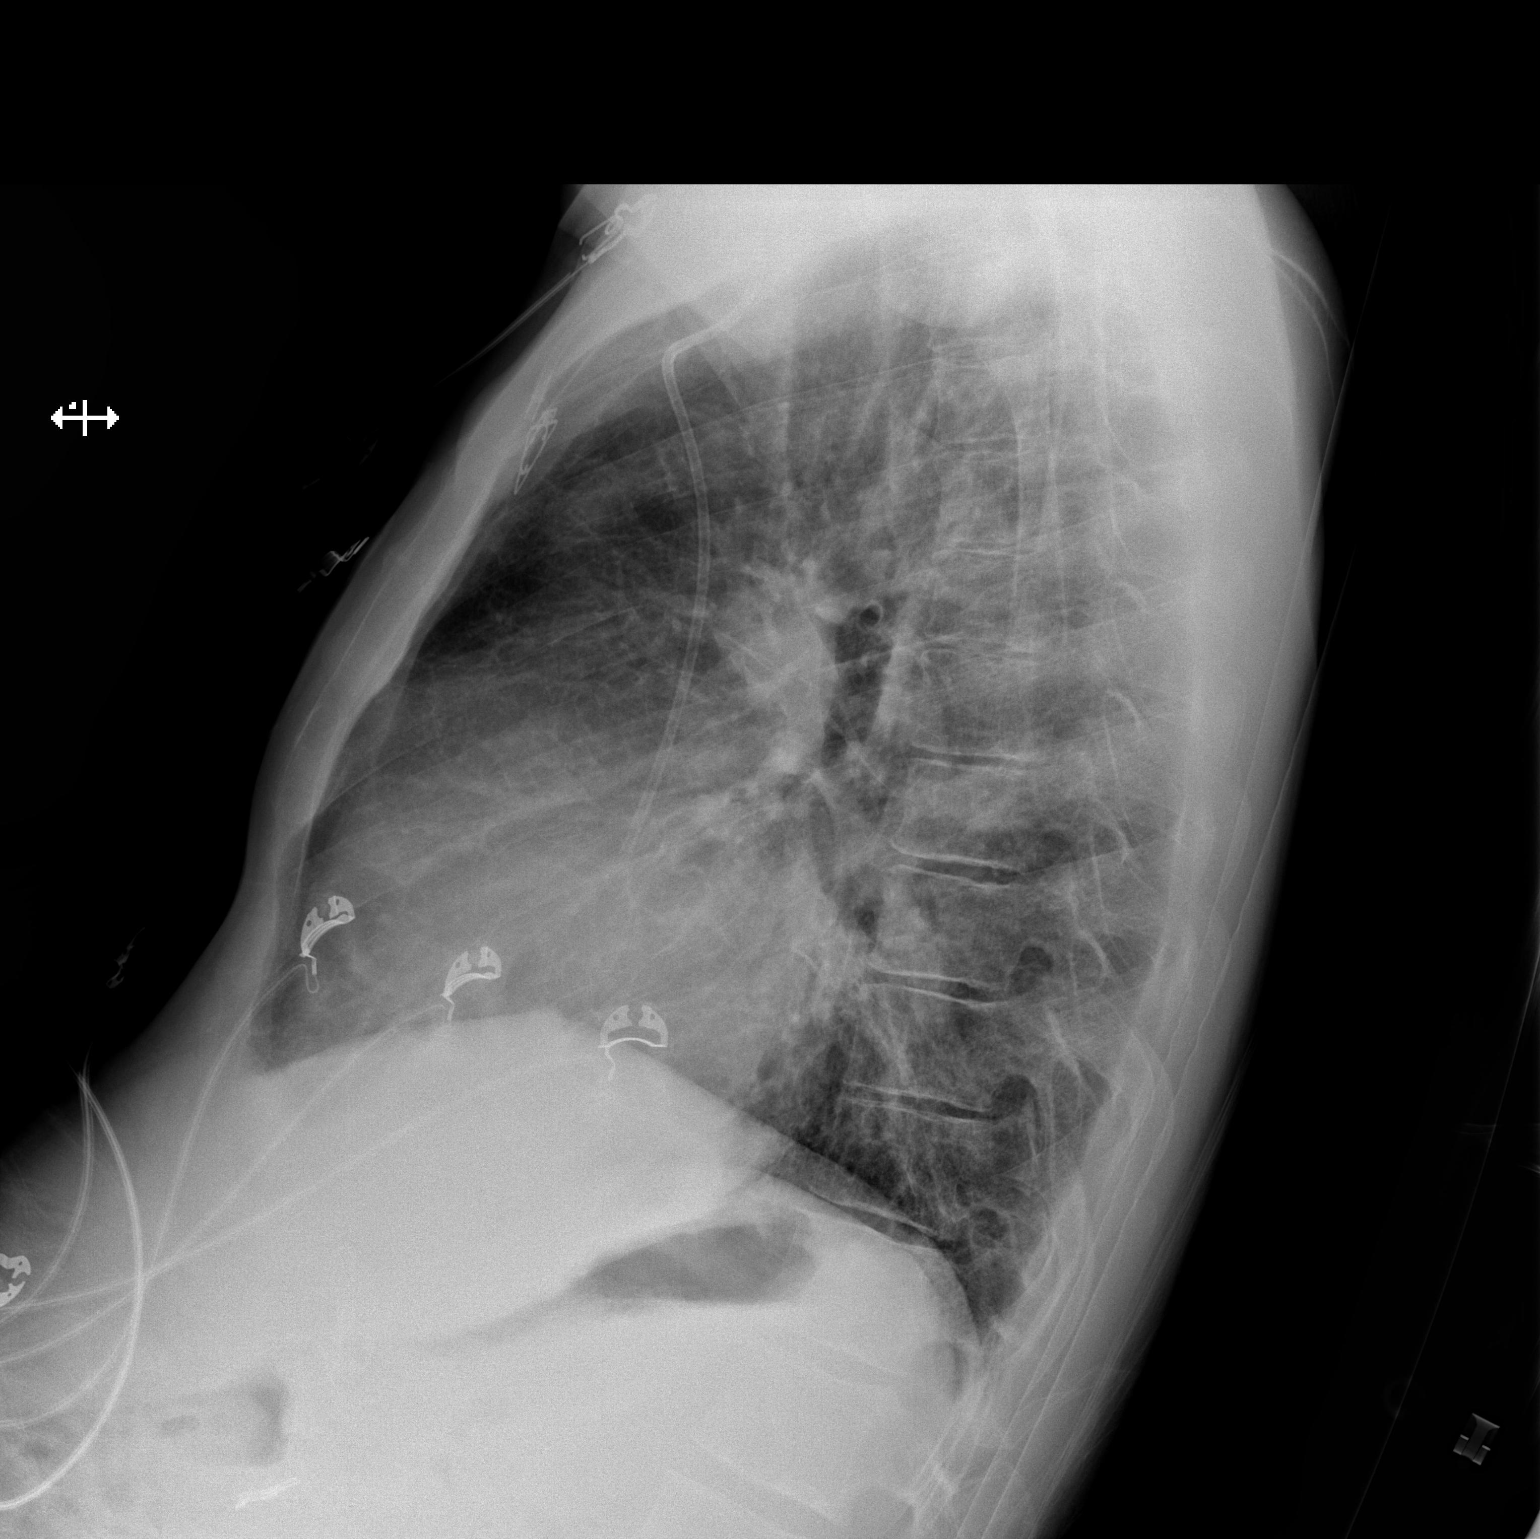

[x chest ap]
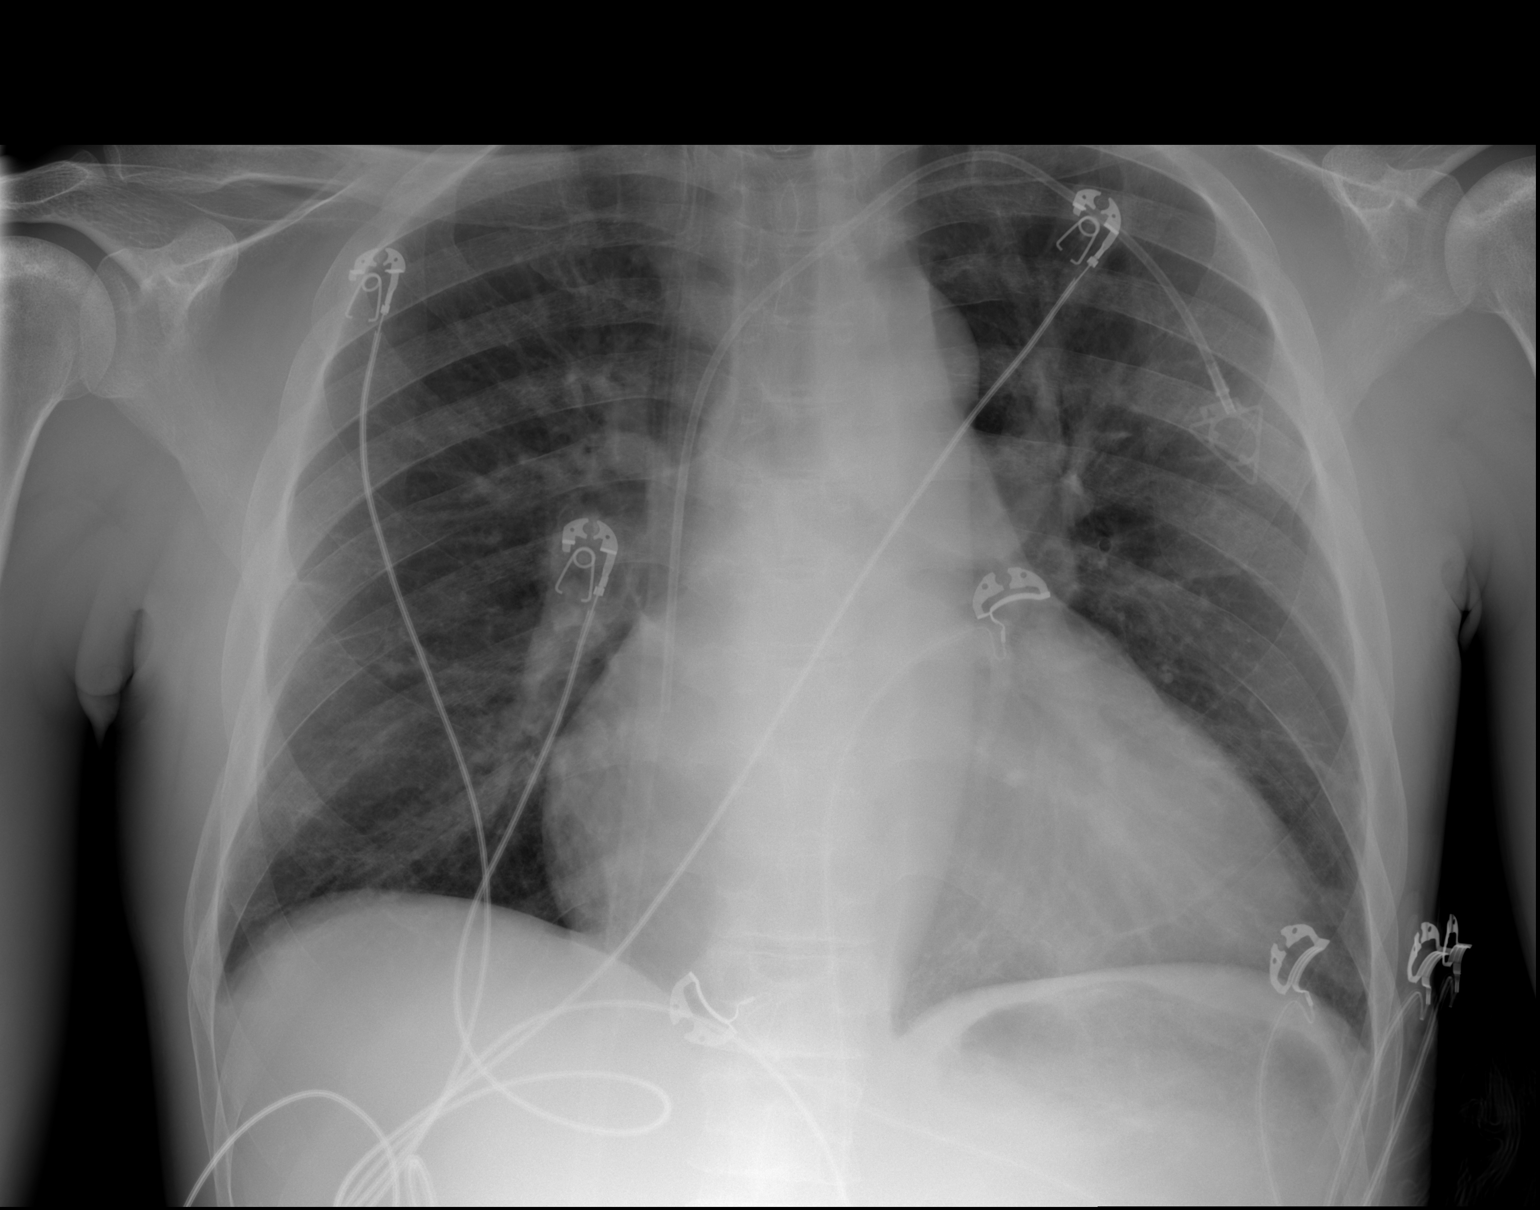

[2 of 2 positions shown; findings below may reference images not displayed]

FINDINGS: Cardiac shadow remains enlarged. A left chest wall port is again
seen. The lungs are clear without focal infiltrate. The bony
structures are within normal limits.
IMPRESSION: No acute abnormality is noted. The patient does have a recent
diagnosis of acute on chronic pulmonary embolism on 03/11/2014

## 2017-02-06 IMAGING — CR DG CHEST 2V
2 series · 2 of 2 positions shown · non-contrast
Comparison: 03/14/2014

CLINICAL DATA: Shortness of breath and chest pain. Sickle cell
patient.

EXAM:
CHEST  2 VIEW

[w chest pa]
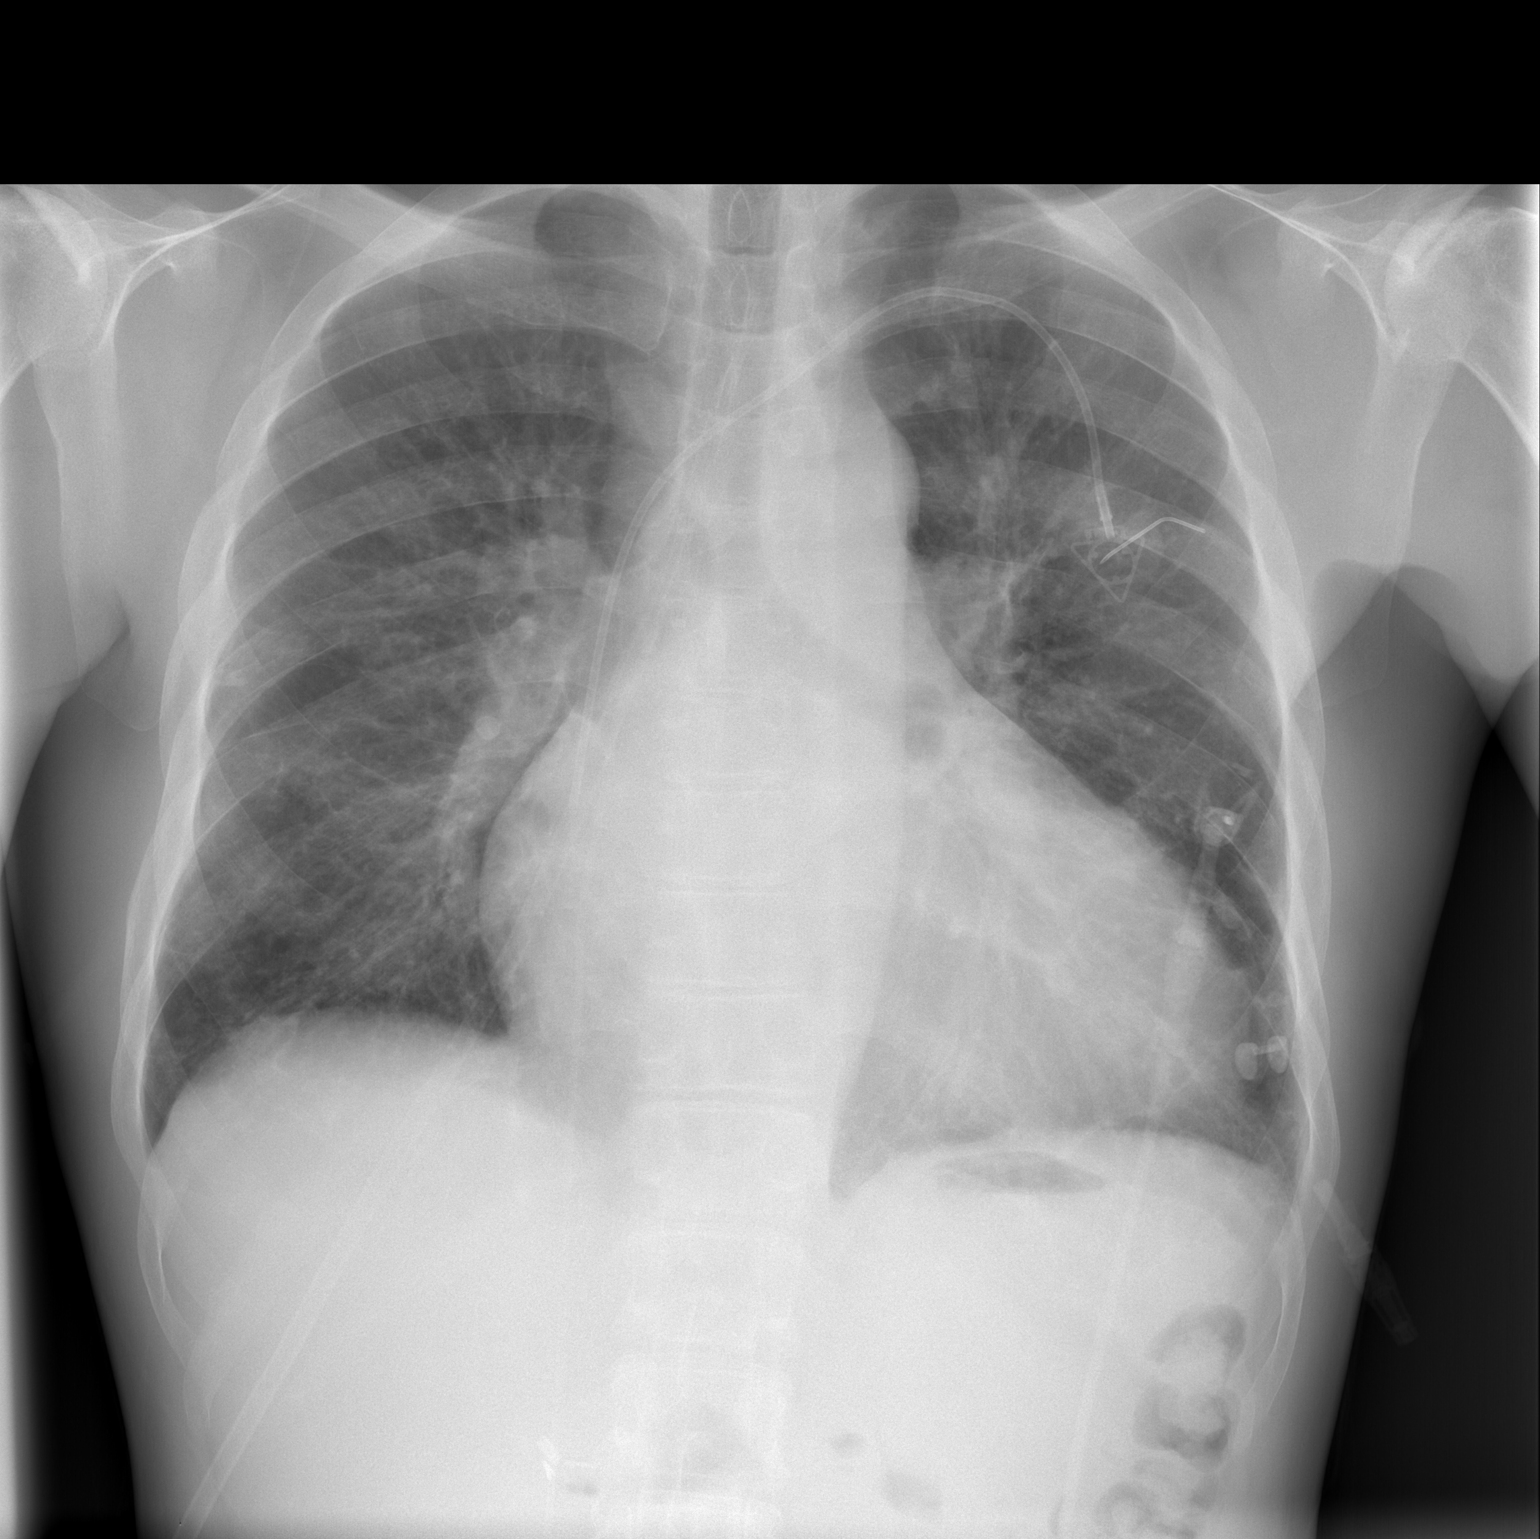

[w chest lat]
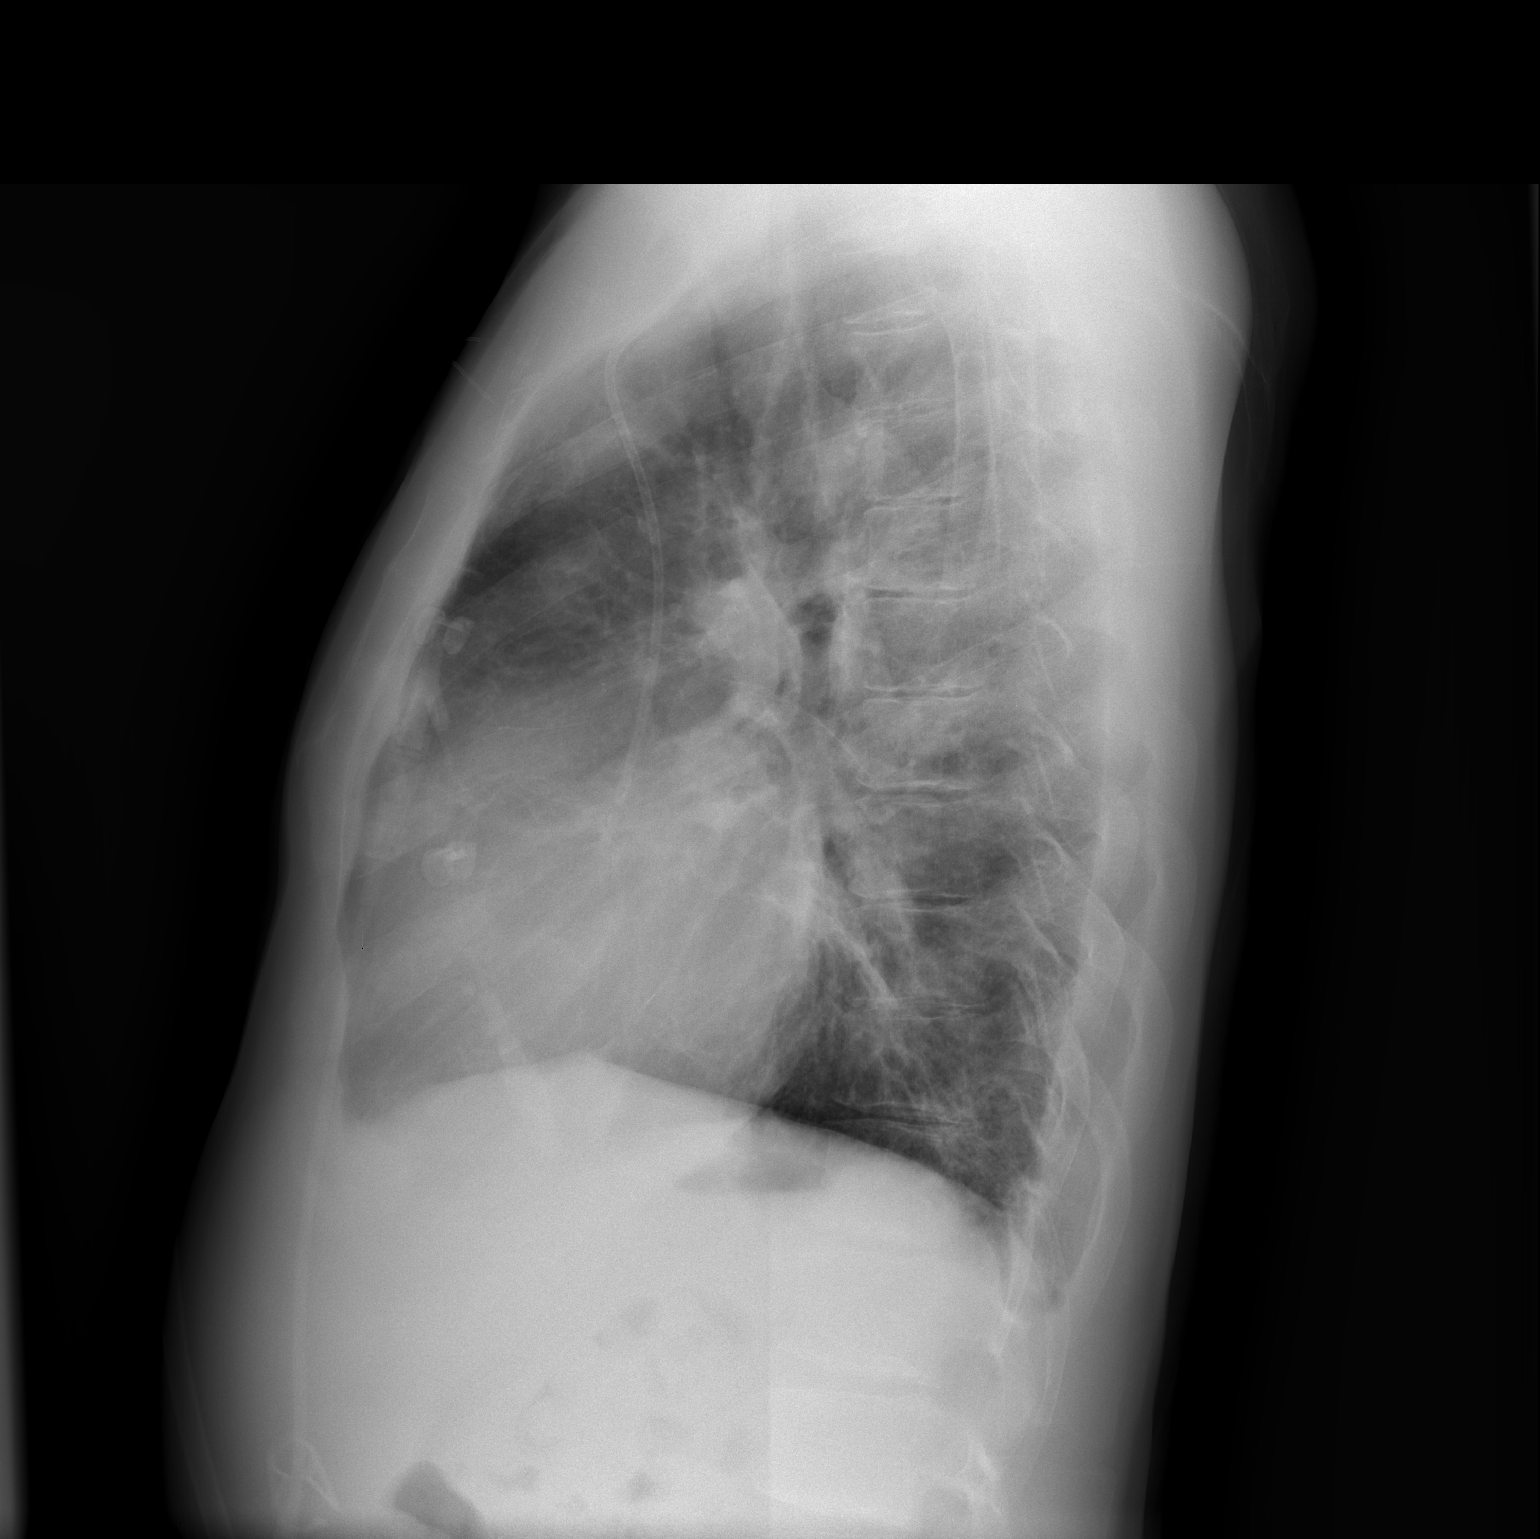

[2 of 2 positions shown; findings below may reference images not displayed]

FINDINGS: Left anterior chest wall Port-A-Cath is present with tip projecting
over the superior cavoatrial junction. Stable cardiomegaly. No
consolidative pulmonary opacities. No pleural effusion or
pneumothorax.
IMPRESSION: No acute cardiopulmonary process.

## 2017-02-08 IMAGING — CT CT HEAD W/O CM
1 of 2 series · 15 of 30 positions shown, 19 images · non-contrast
Comparison: 04/25/2011

CLINICAL DATA: Left upper extremity acute weakness, dizziness,
slurred speech with expressive dysphasia. History of the sickle cell
disease.

EXAM:
CT HEAD WITHOUT CONTRAST
TECHNIQUE: Contiguous axial images were obtained from the base of the skull
through the vertex without contrast.

[Series 2: head_seq 4.5 h37s st · axial · 0.43mm/px · z∈[-97,+47]mm · 15 of 36 slices shown, 19 images]
[im 2/36  brain]
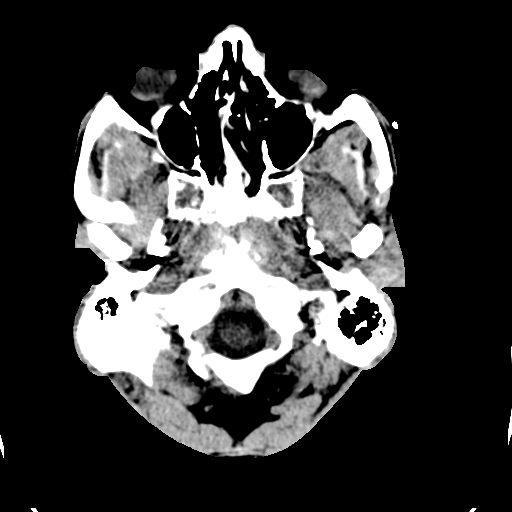
[im 2/36  bone]
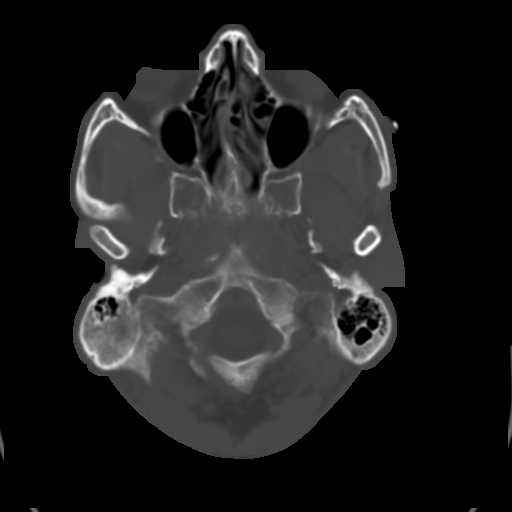
[im 6/36  brain]
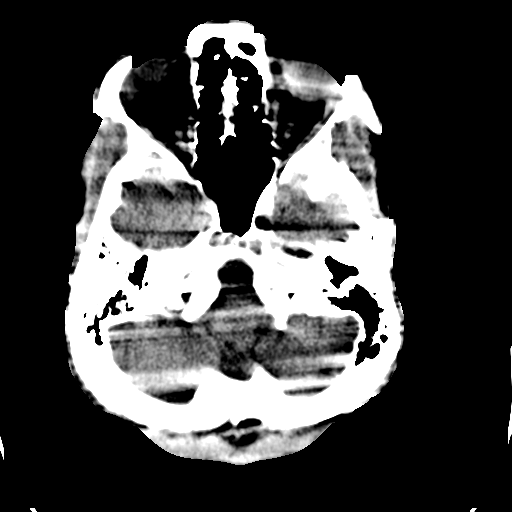
[im 7/36  brain]
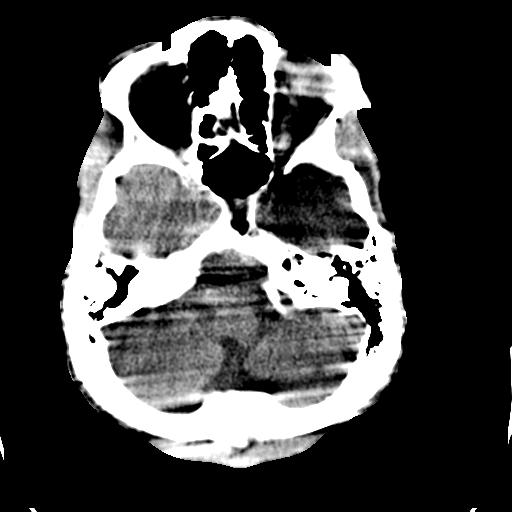
[im 9/36  brain]
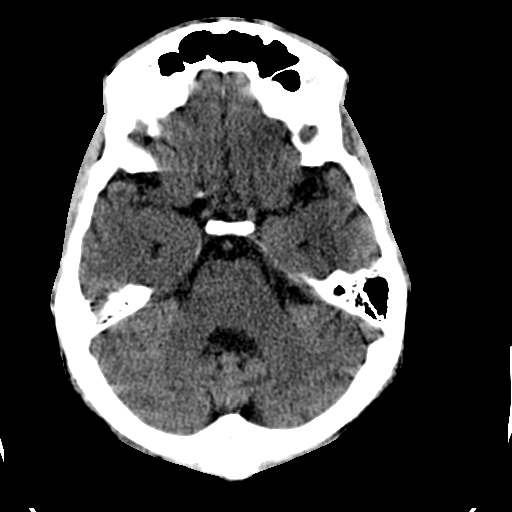
[im 12/36  brain]
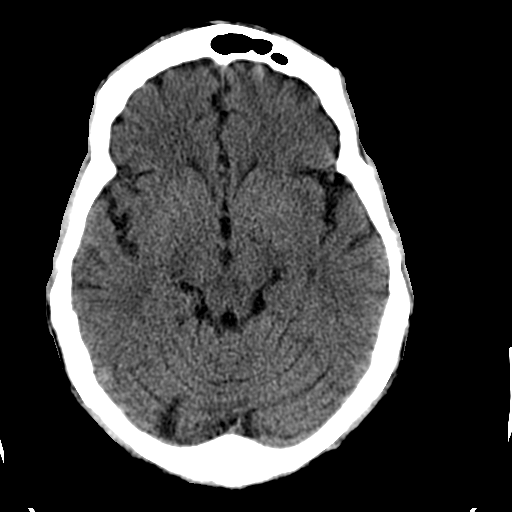
[im 12/36  bone]
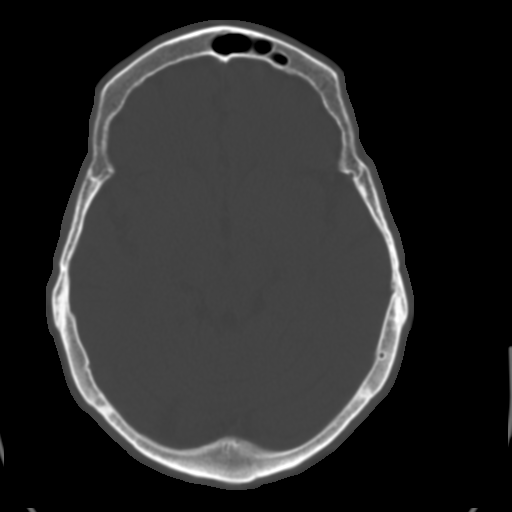
[im 14/36  brain]
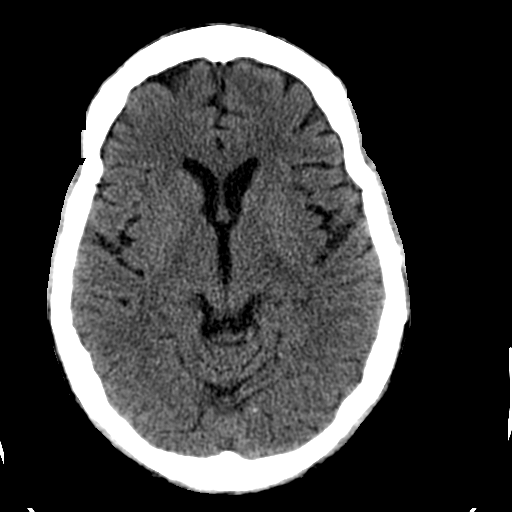
[im 16/36  brain]
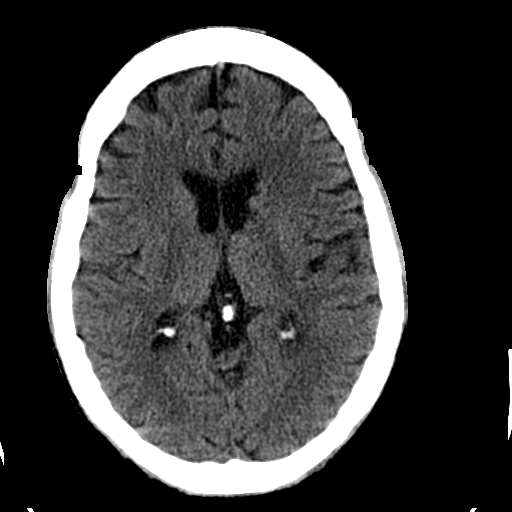
[im 19/36  brain]
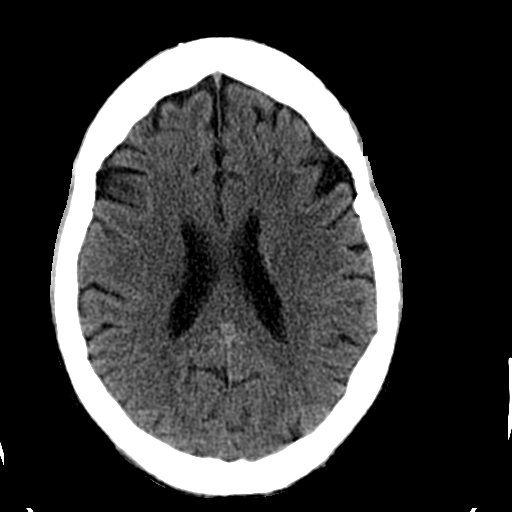
[im 21/36  brain]
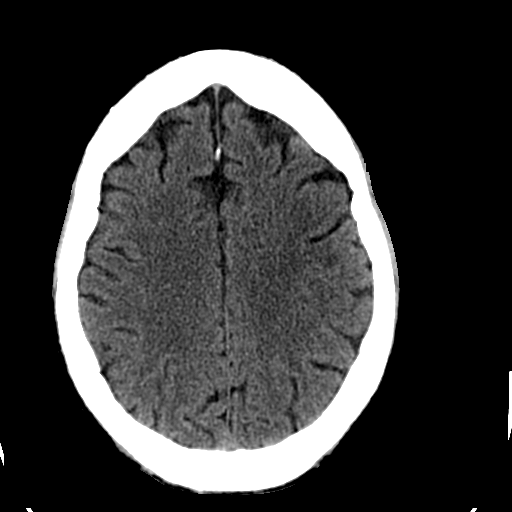
[im 21/36  bone]
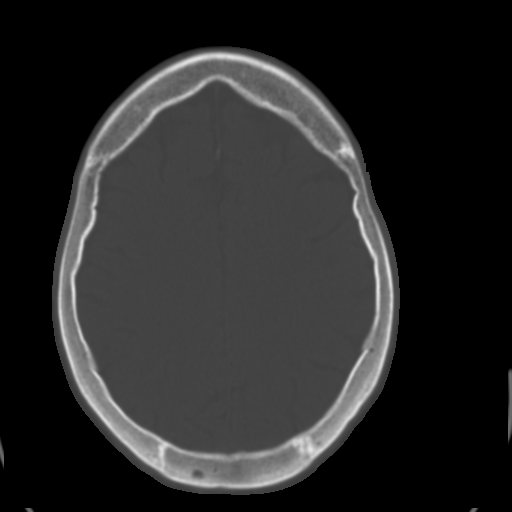
[im 22/36  brain]
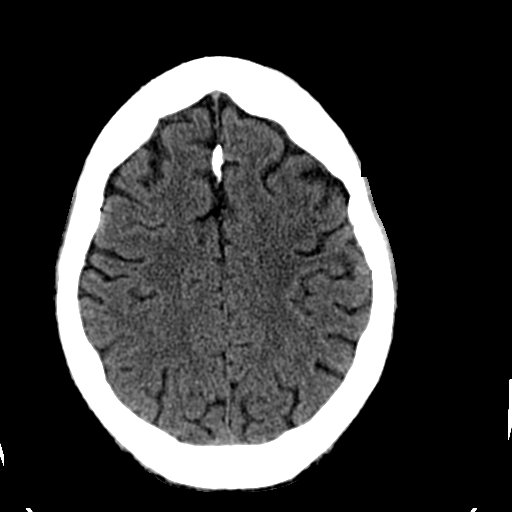
[im 26/36  brain]
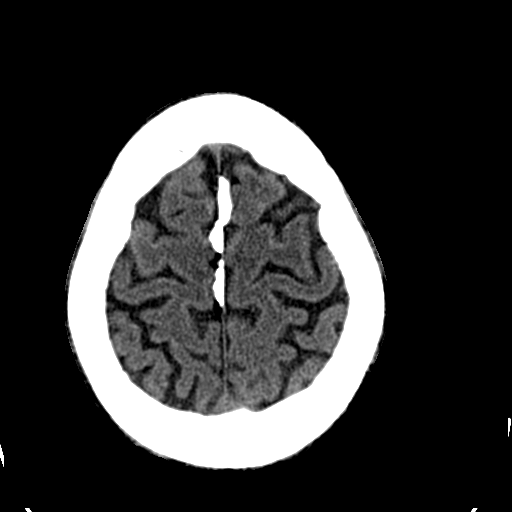
[im 27/36  brain]
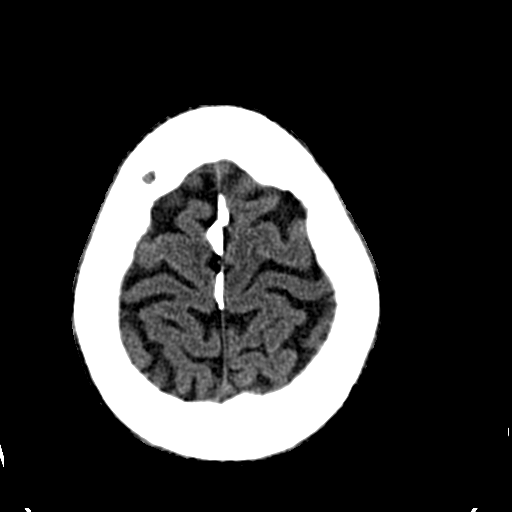
[im 29/36  brain]
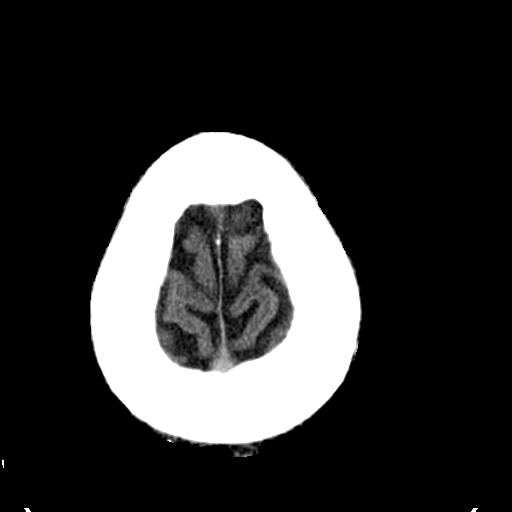
[im 29/36  bone]
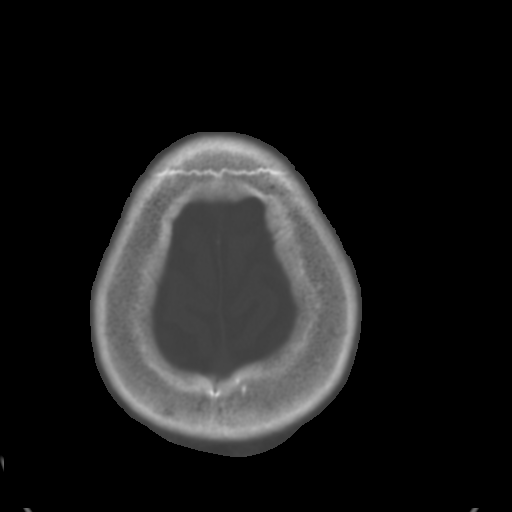
[im 32/36  brain]
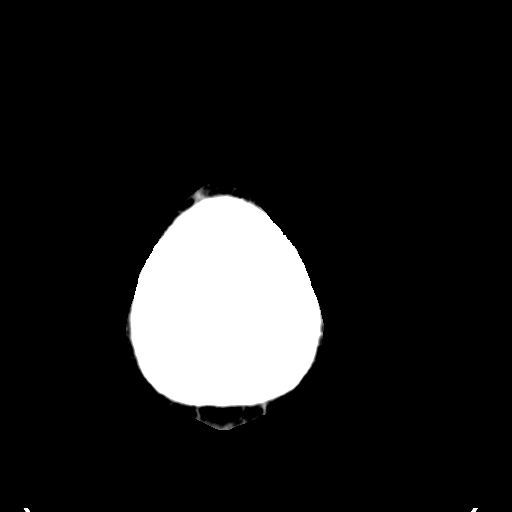
[im 34/36  brain]
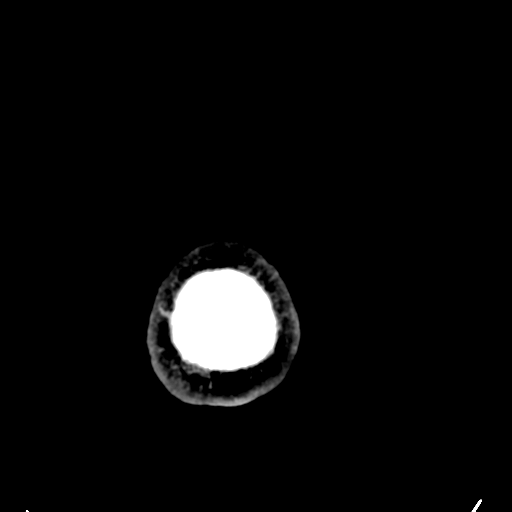

[15 of 30 positions shown; findings below may reference images not displayed]

FINDINGS: Mild brain atrophy pattern for the patient's young age. Remote
caudate lacunar infarct on the left. Periventricular mild chronic
white matter microvascular ischemic changes suspected. No acute
intracranial hemorrhage, known infarction, mass lesion, midline
shift, herniation, hydrocephalus, or extra-axial fluid collection.
No focal mass effect or edema. Cisterns patent. Remote right
cerebellar lacunar type infarct. Mild cerebellar atrophy as well.

Mastoids and sinuses remain clear.  No acute osseous finding.
IMPRESSION: No acute intracranial finding.

Remote left caudate and cerebellar lacunar-type infarcts.

Atrophy and chronic white matter microvascular ischemic changes.

## 2017-02-09 IMAGING — CT CT CHEST W/ CM
1 of 2 series · 14 of 29 positions shown, 18 images · IV contrast (OMNIPAQUE)
Comparison: 03/11/2013.

CLINICAL DATA: Short of breath. Fever since last night. Pulmonary
embolism. Acute on chronic pulmonary embolism.

EXAM:
CT CHEST WITH CONTRAST
TECHNIQUE: Multidetector CT imaging of the chest was performed during
intravenous contrast administration.
CONTRAST:  80mL OMNIPAQUE IOHEXOL 300 MG/ML  SOLN

[Series 2: rtn chest with st · axial · 0.75mm/px · z∈[+1056,+1316]mm · 14 of 62 slices shown, 18 images]
[im 5/62  mediastinal]
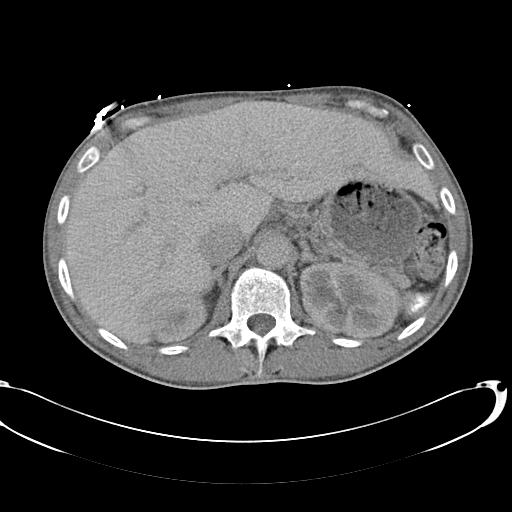
[im 5/62  lung]
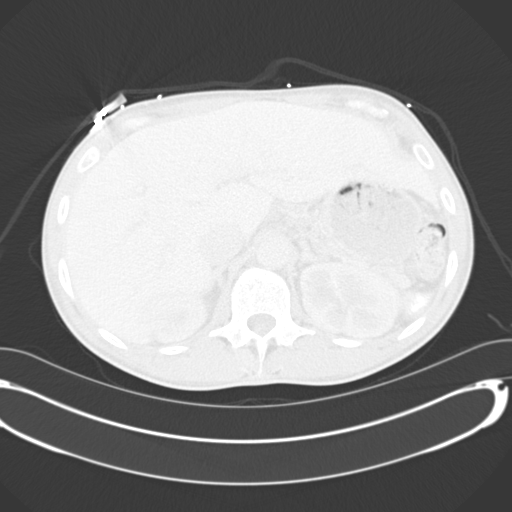
[im 9/62  lung]
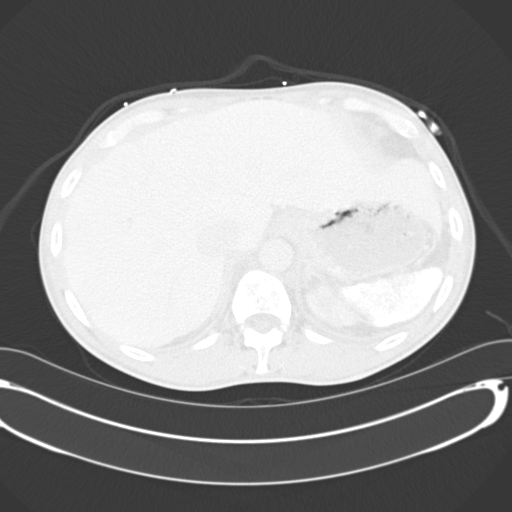
[im 14/62  lung]
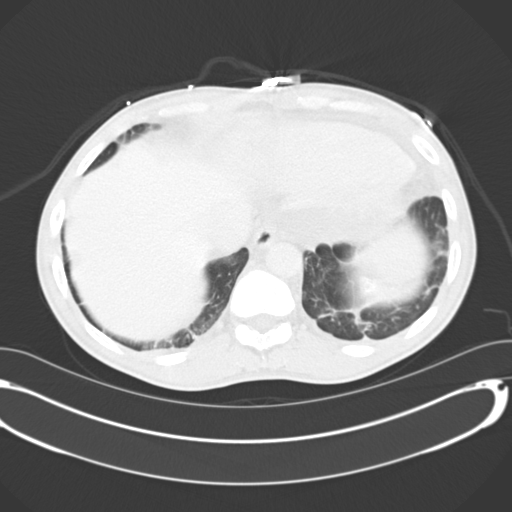
[im 18/62  lung]
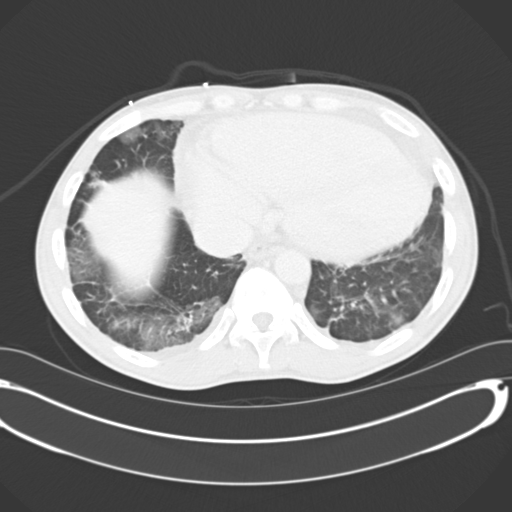
[im 22/62  mediastinal]
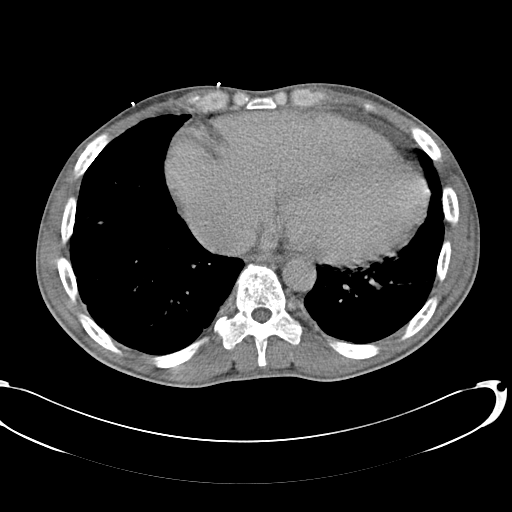
[im 22/62  lung]
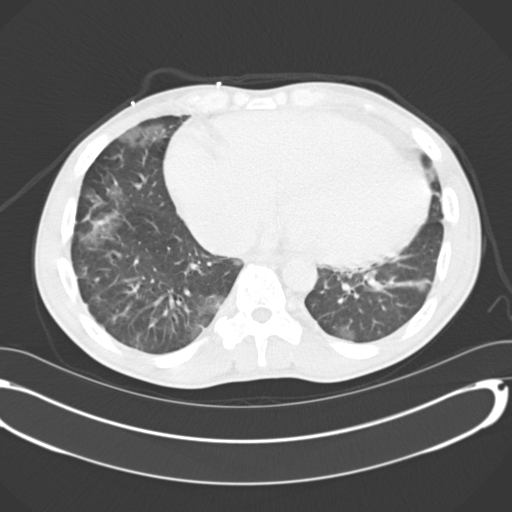
[im 27/62  lung]
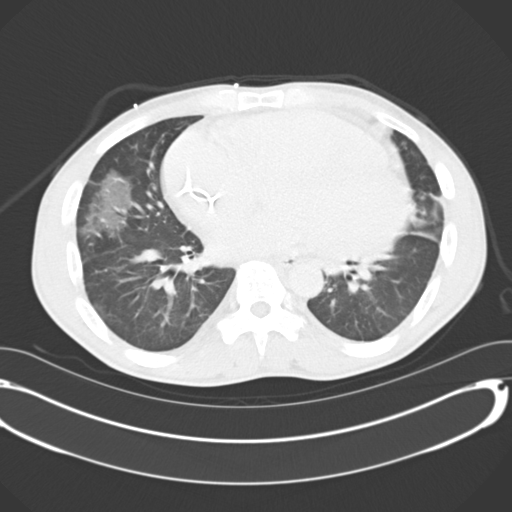
[im 30/62  lung]
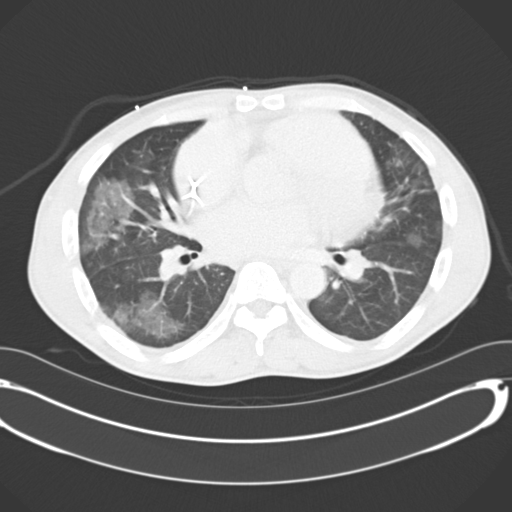
[im 31/62  lung]
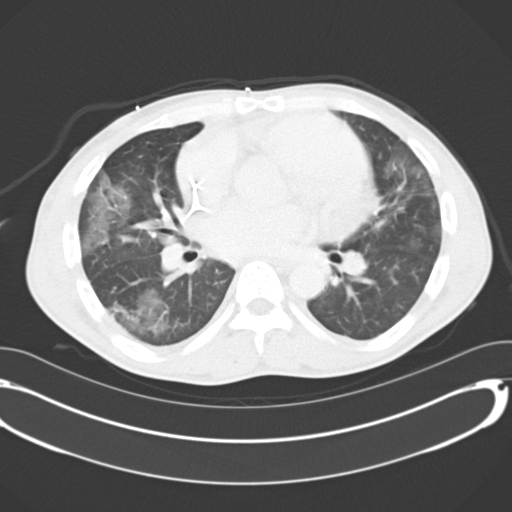
[im 35/62  mediastinal]
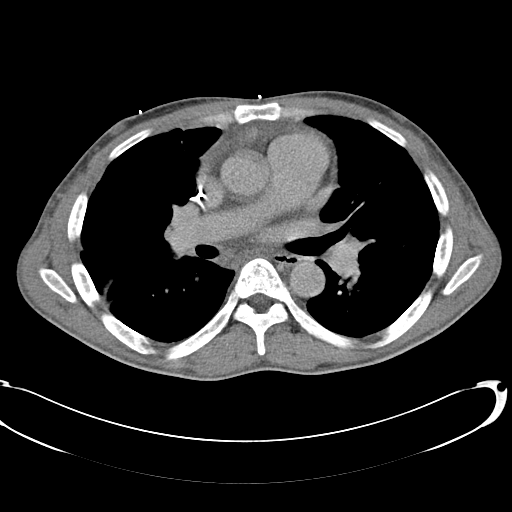
[im 35/62  lung]
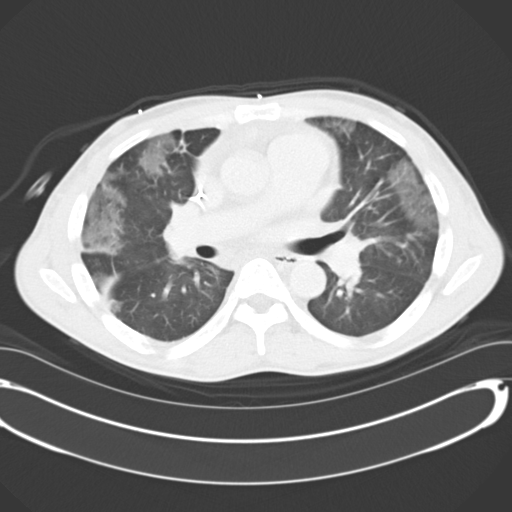
[im 40/62  lung]
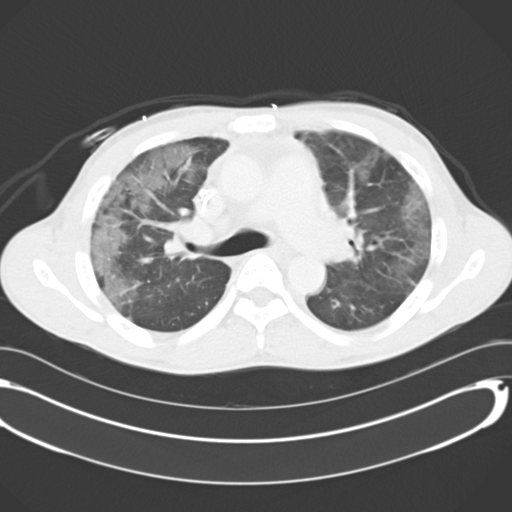
[im 44/62  lung]
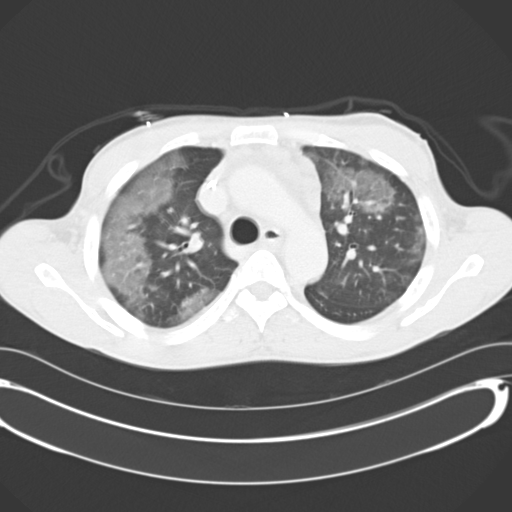
[im 48/62  lung]
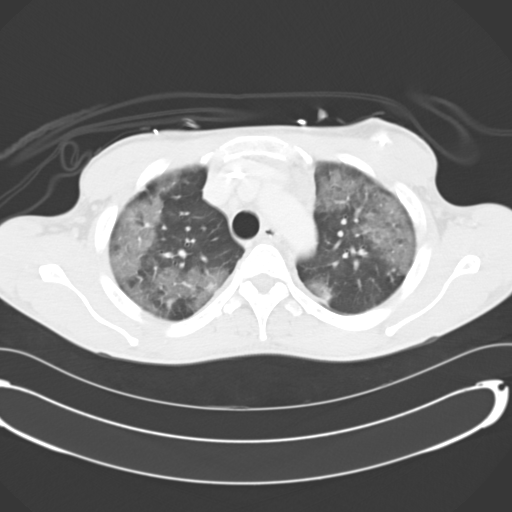
[im 53/62  mediastinal]
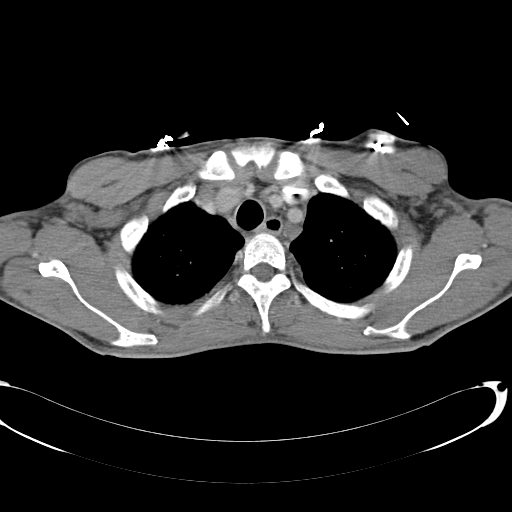
[im 53/62  lung]
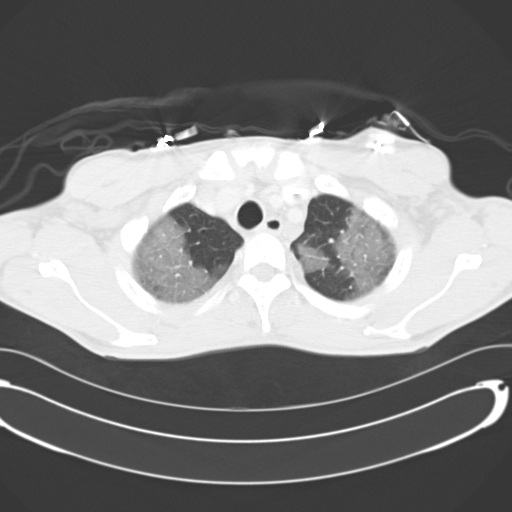
[im 57/62  lung]
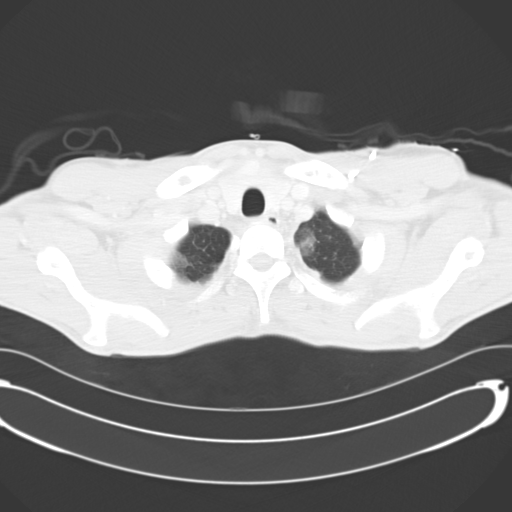

[14 of 29 positions shown; findings below may reference images not displayed]

FINDINGS: Musculoskeletal: Sickle cell bony changes are present throughout the
thoracic spine with a H shaped vertebral bodies. Diffuse osseous
sclerosis compatible with old infarcts associated with sickle cell
anemia.

Lungs: There is a peripheral mosaic attenuation pattern consistent
with acute chest syndrome. The differential considerations are
pulmonary edema, small vessel infarcts with infection or
interstitial lung disease unlikely.

Central airways: Patent.

Vasculature: Grossly within normal limits. LEFT subclavian
Port-A-Cath terminating in the RIGHT atrium. Cardiomegaly is
present. This is a chronic finding and appears similar to the prior
CT and radiographs.

Effusions: Tiny bilateral pleural effusions layering dependently.
Small pericardial effusion.

Lymphadenopathy: Unchanged prevascular lymphadenopathy. No axillary
adenopathy.

Esophagus: Normal.

Upper abdomen: Periportal edema is present. Splenic auto infarction.

Other: None.
IMPRESSION: 1. Scattered areas of ground-glass attenuation throughout the lungs
bilaterally. Findings are compatible with acute chest syndrome in a
patient with sickle cell disease. General differential
considerations in acute chest include pulmonary edema, pulmonary
hemorrhage, infection/inflammation and fat embolism. Given other
findings, fat embolism and pulmonary edema are the most likely
considerations. Unchanged cardiomegaly.
2. Small bilateral pleural effusions. Small pericardial effusion and
cardiomegaly.
3. Stable prevascular mediastinal adenopathy.

## 2017-02-21 IMAGING — CR DG CHEST 2V
2 series · 2 of 2 positions shown · non-contrast
Comparison: Chest x-ray dated 03/16/2014 and chest CT 03/19/2014

CLINICAL DATA: Chest pain and shortness of breath. Sickle cell
crisis.

EXAM:
CHEST  2 VIEW

[w chest pa]
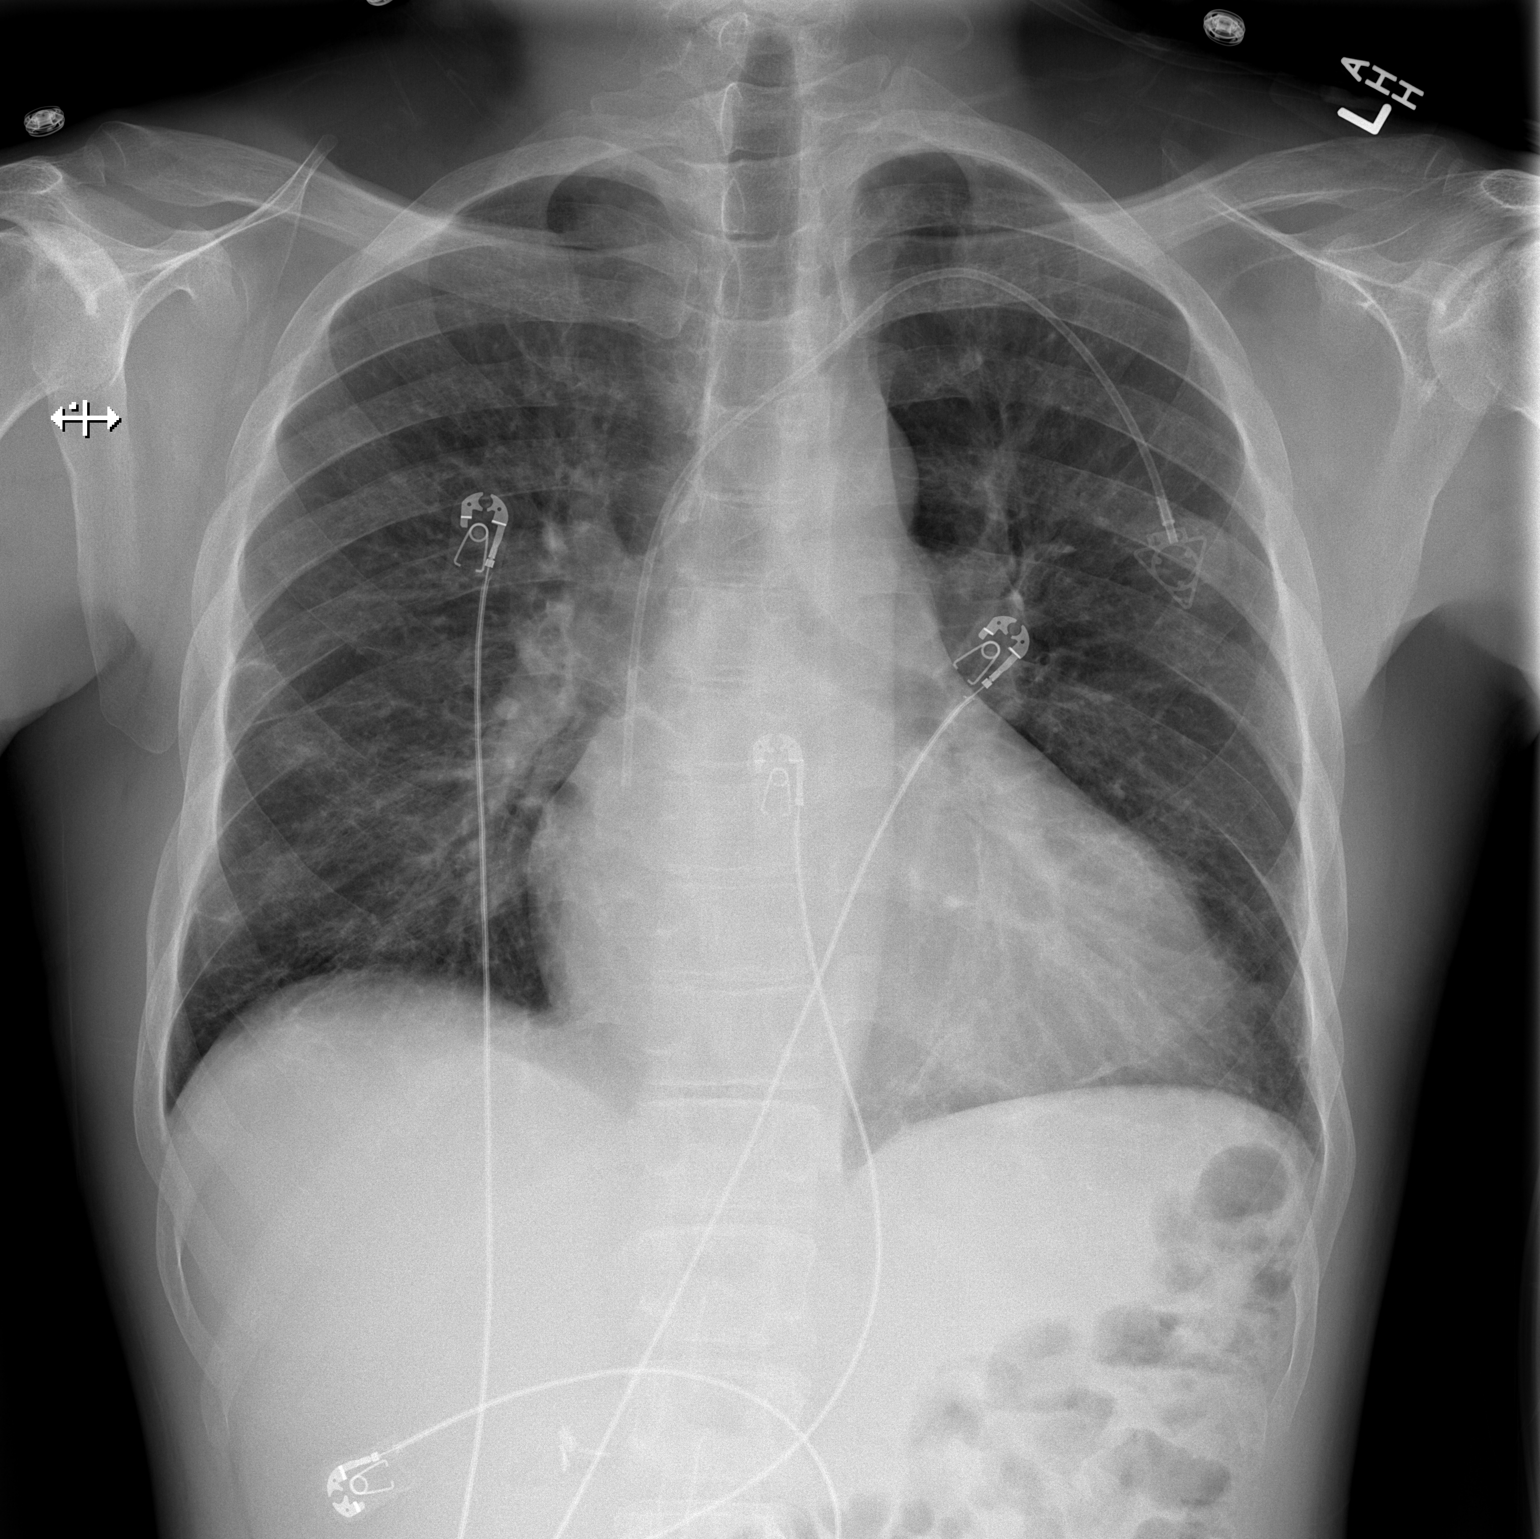

[w chest lat]
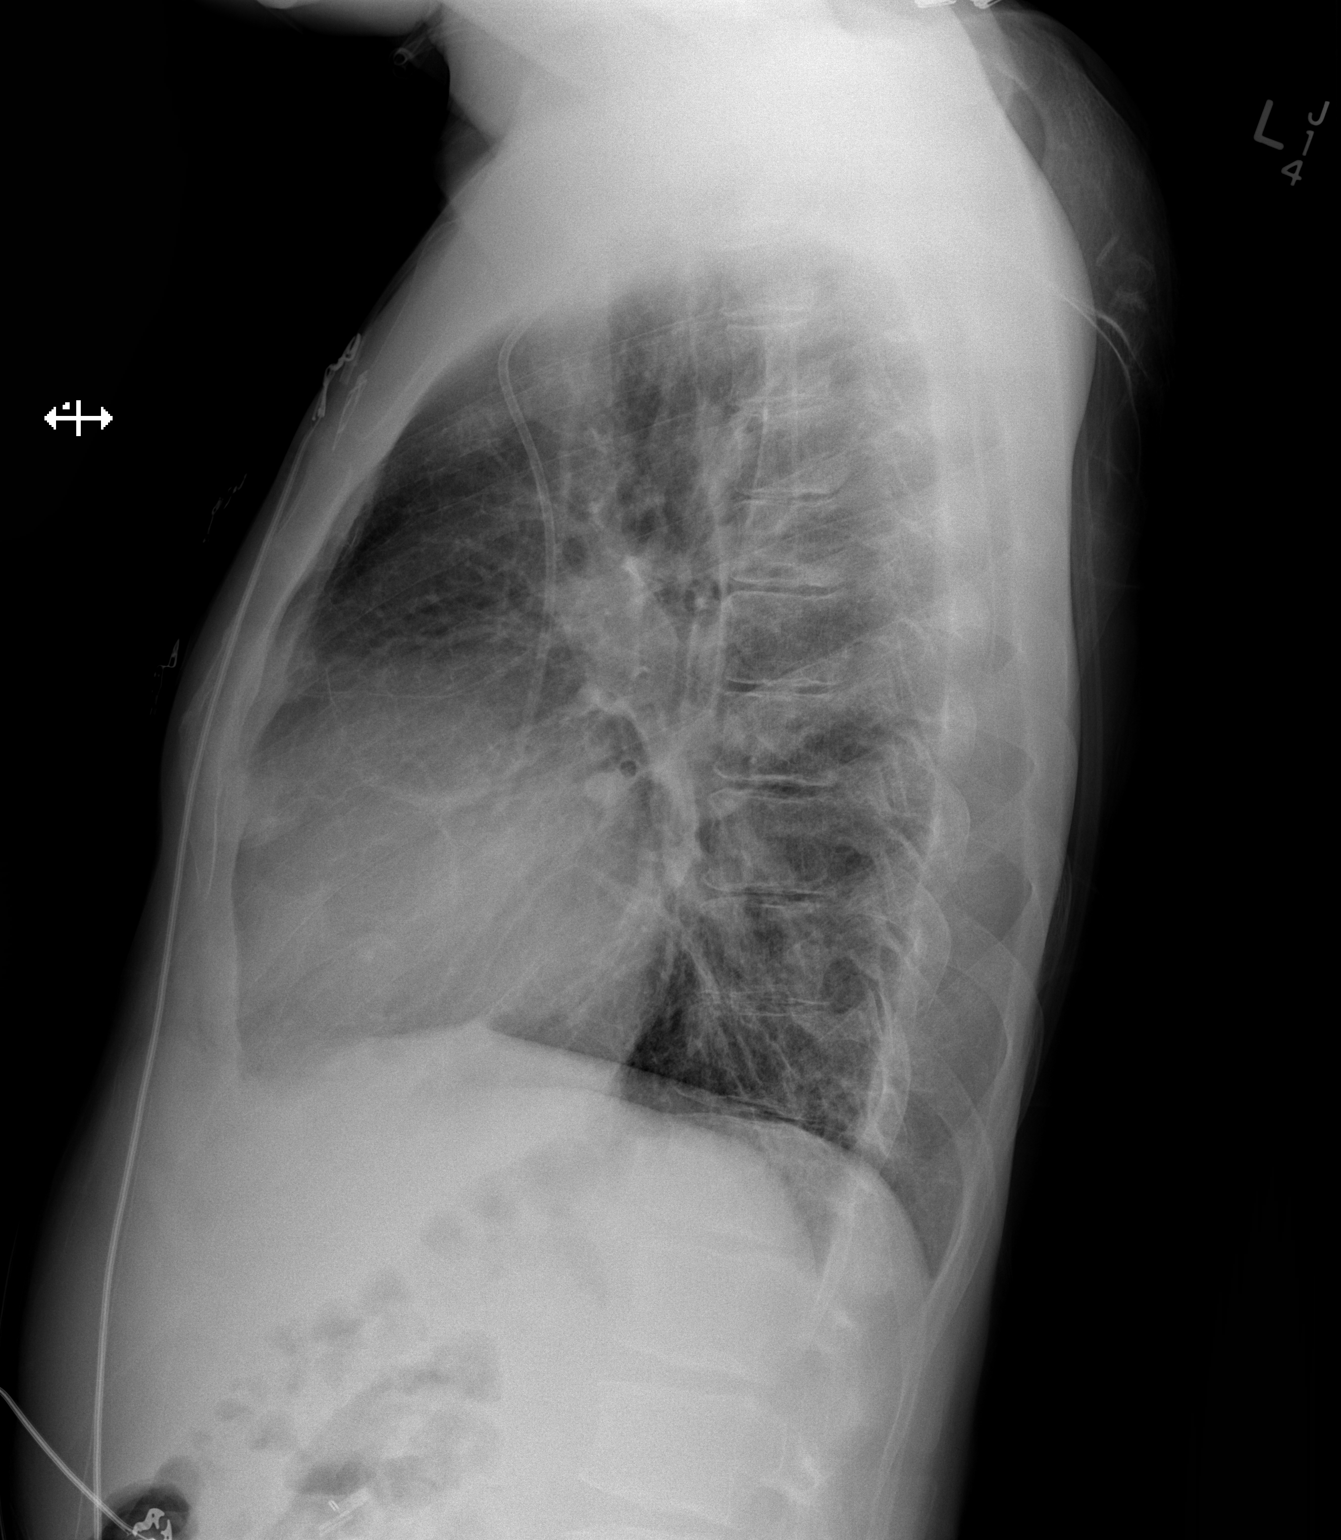

[2 of 2 positions shown; findings below may reference images not displayed]

FINDINGS: Chronic cardiomegaly. Pulmonary vascularity is within normal limits.
Power port in place. Chronic accentuation of the interstitial
markings. No acute infiltrates.
IMPRESSION: Chronic cardiomegaly.  Stable scarring in the lungs.

## 2017-02-27 IMAGING — CR DG CHEST 2V
2 series · 2 of 2 positions shown · non-contrast
Comparison: 03/31/2014.

CLINICAL DATA: LEFT-sided chest pain. Rib pain. Symptoms for 1
week. Sickle cell anemia.

EXAM:
CHEST  2 VIEW

[w chest pa]
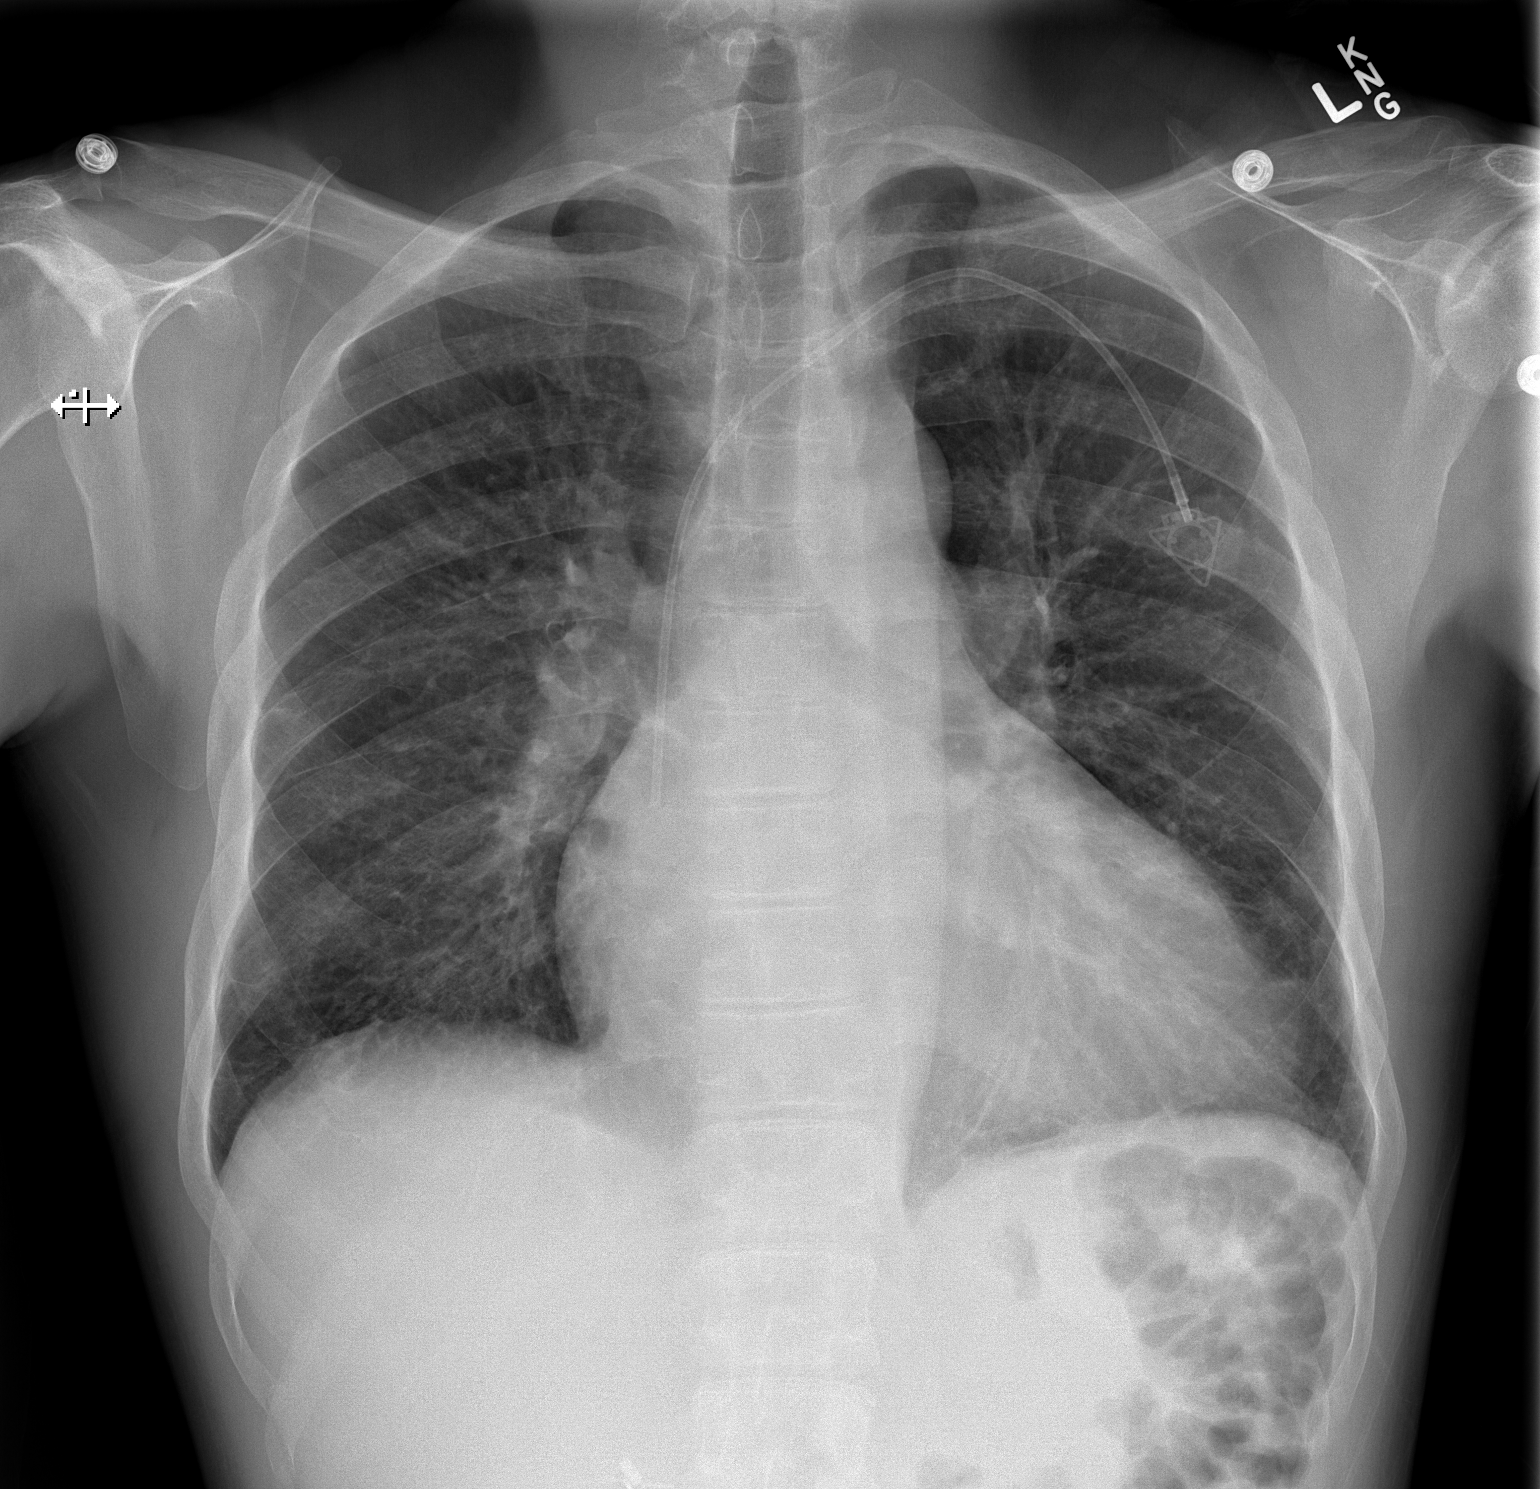

[w chest lat]
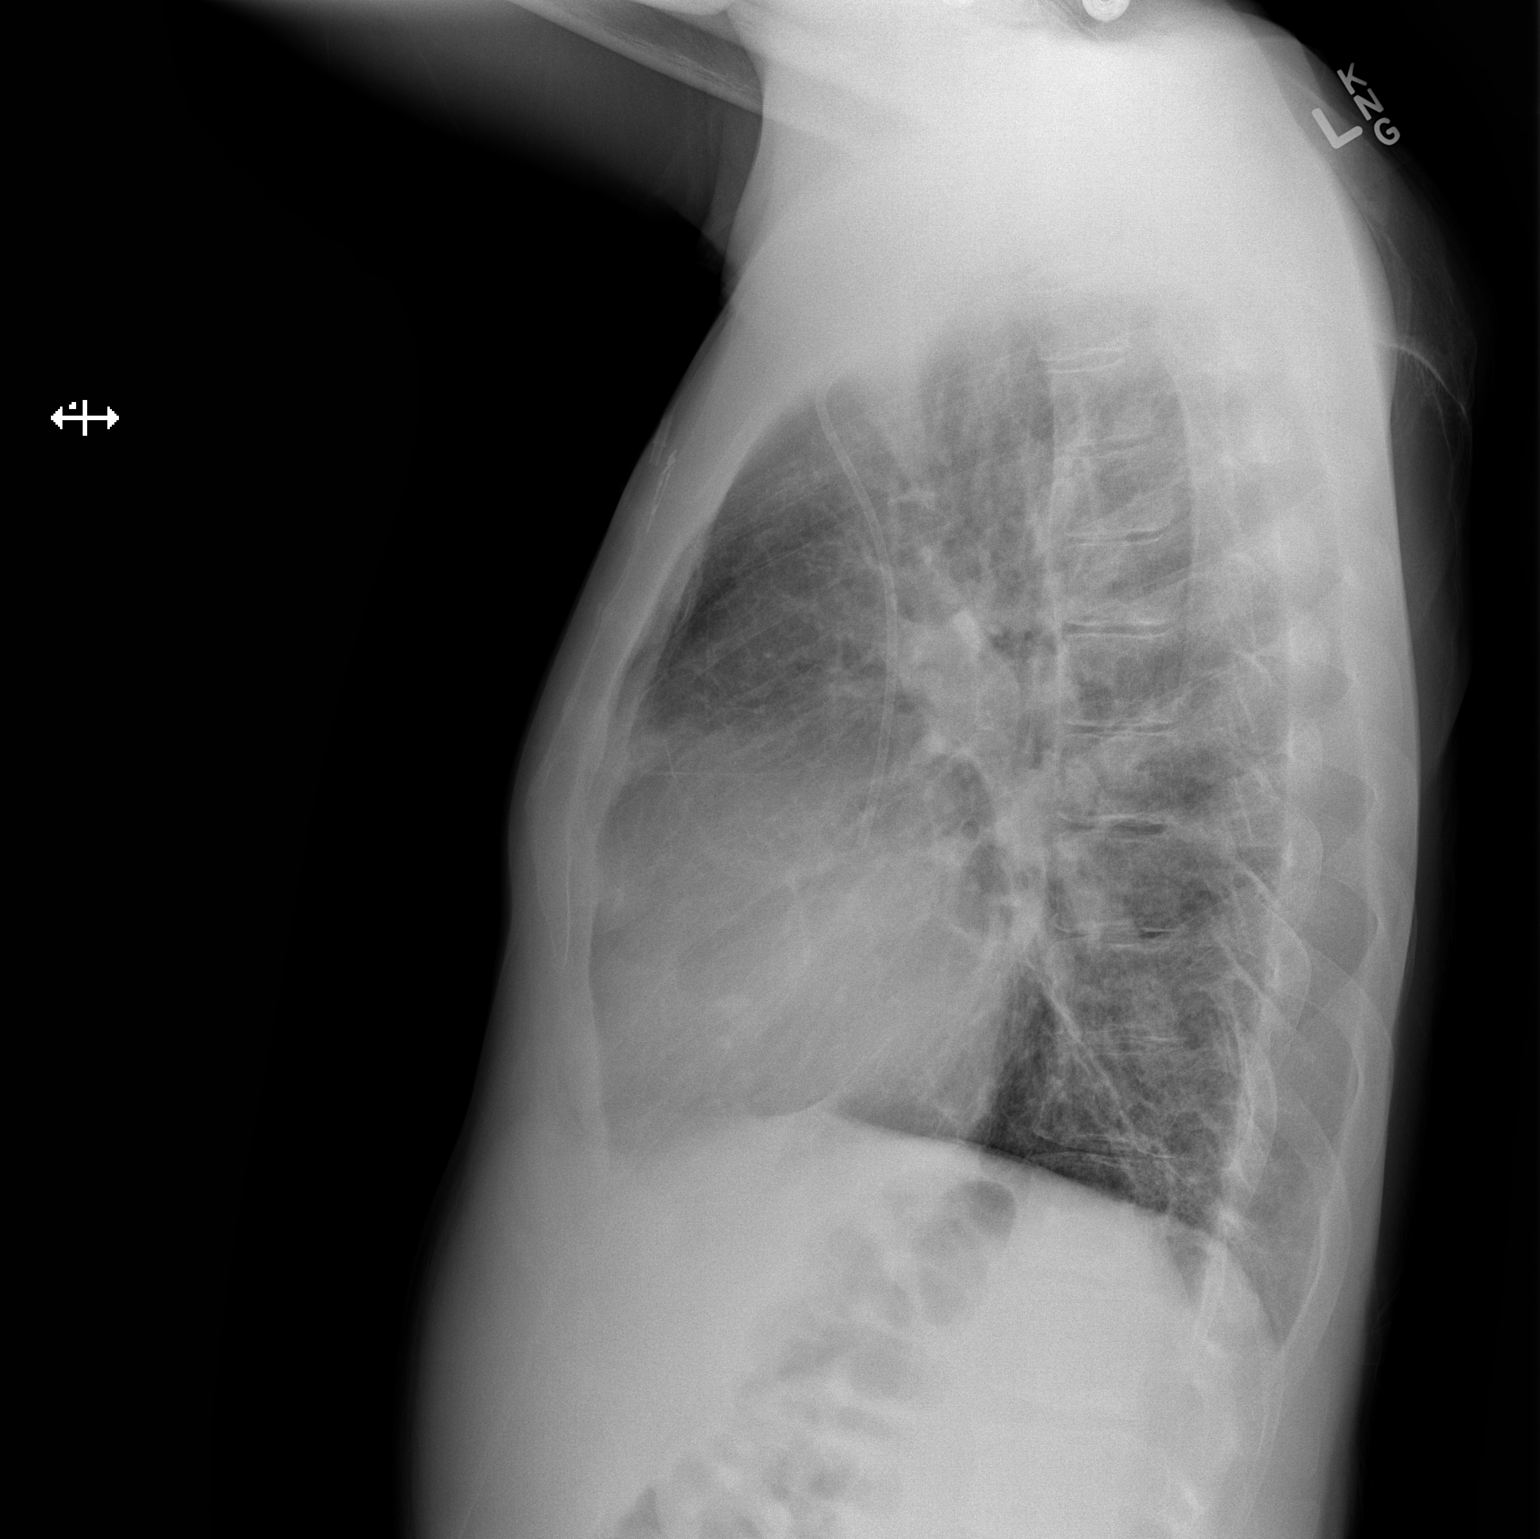

[2 of 2 positions shown; findings below may reference images not displayed]

FINDINGS: LEFT subclavian power port unchanged. Cardiopericardial silhouette
is enlarged but unchanged compared to prior. There is no airspace
consolidation to suggest acute chest syndrome. No pleural effusions.
Chronic pulmonary parenchymal scarring associated with sickle cell
disease. Bony thoracic spine sickle cell changes better seen on
prior CT than on today's radiograph. Enlargement of the pulmonary
hila compatible with secondary pulmonary hypertension.
IMPRESSION: Chronic sickle cell changes without findings of acute chest
syndrome.

## 2017-03-17 IMAGING — CR DG CHEST 2V
2 series · 2 of 2 positions shown · non-contrast
Comparison: 04/06/2014

CLINICAL DATA: Sickle cell crisis with chest pain. Gradual onset
over 2 days. Worse this morning.

EXAM:
CHEST  2 VIEW

[w chest pa]
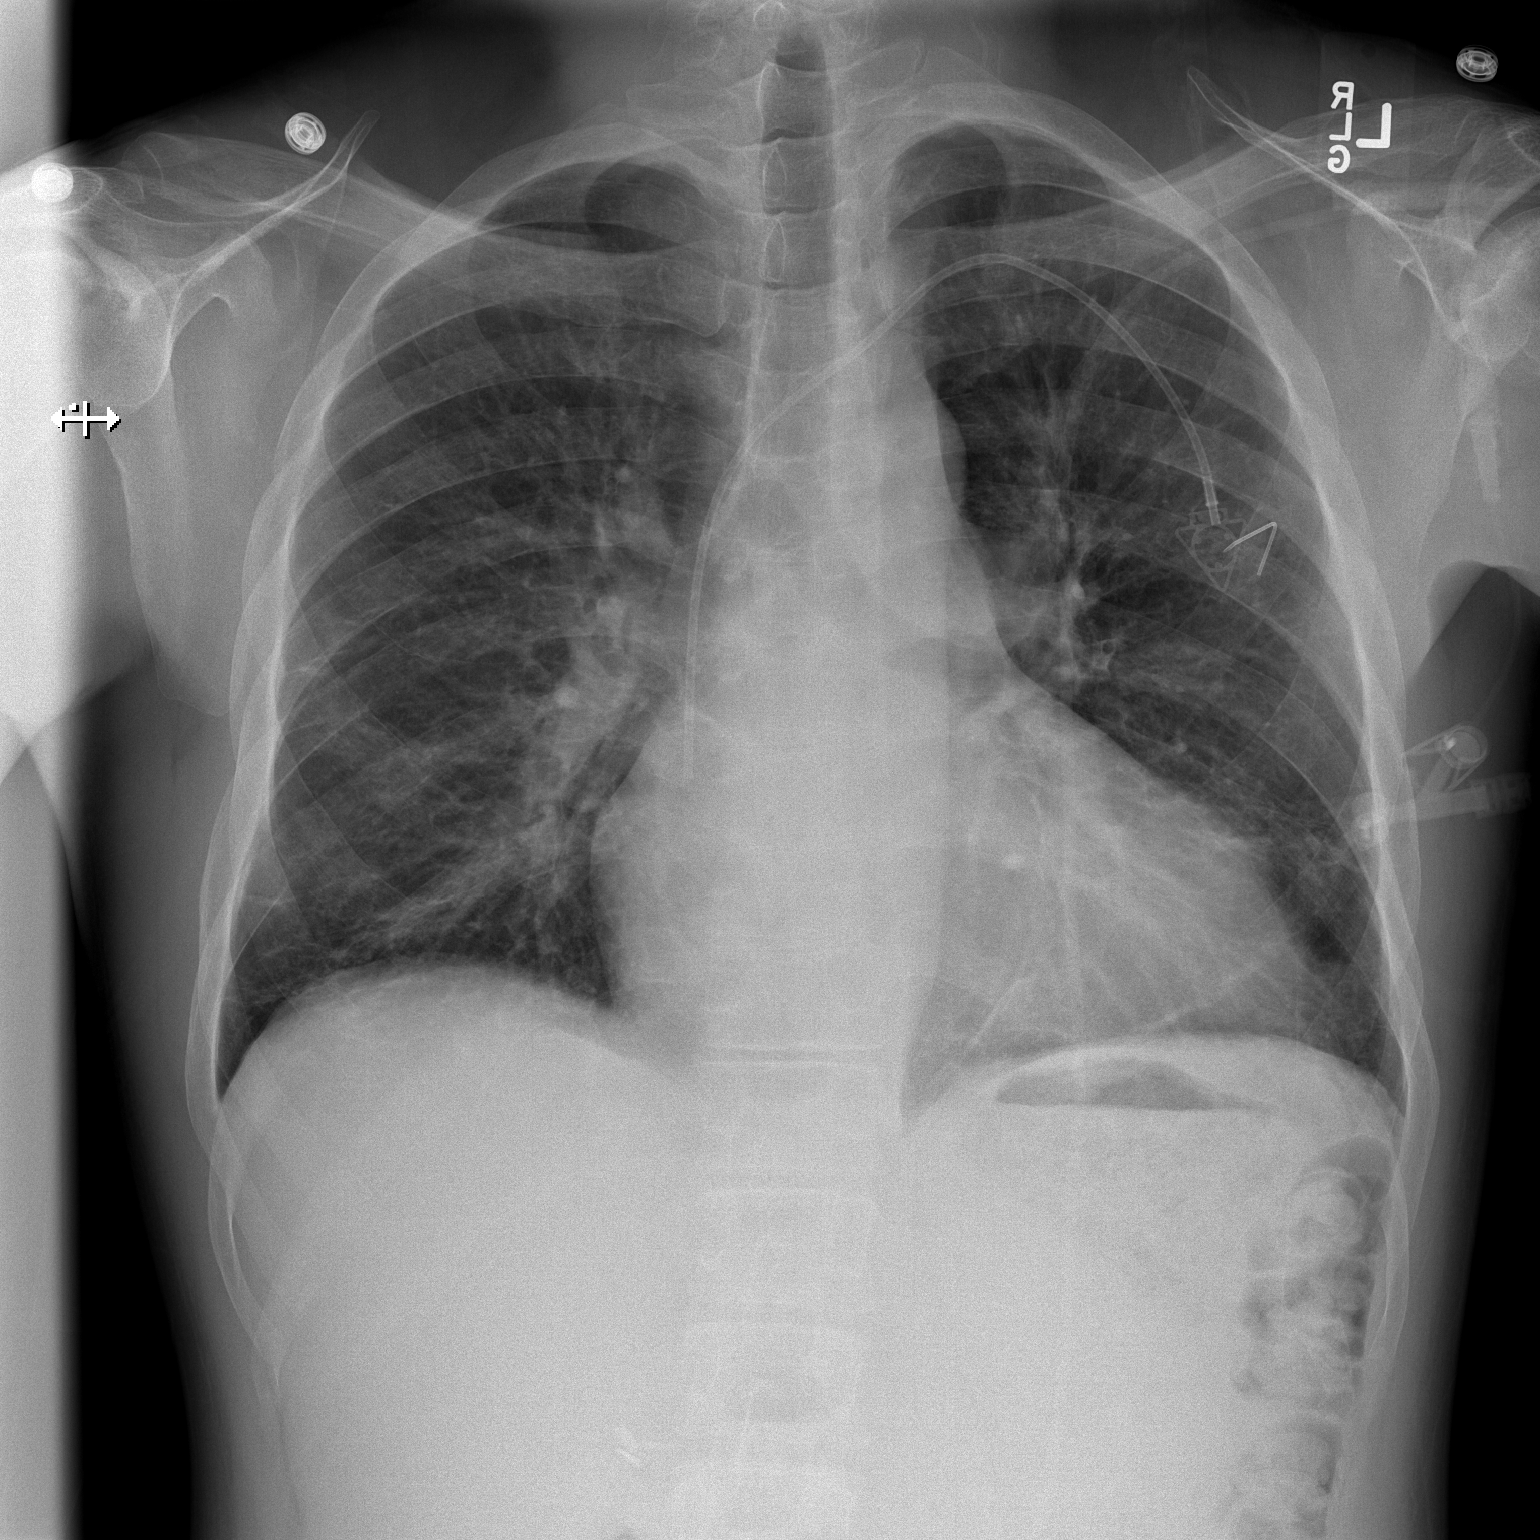

[w chest lat]
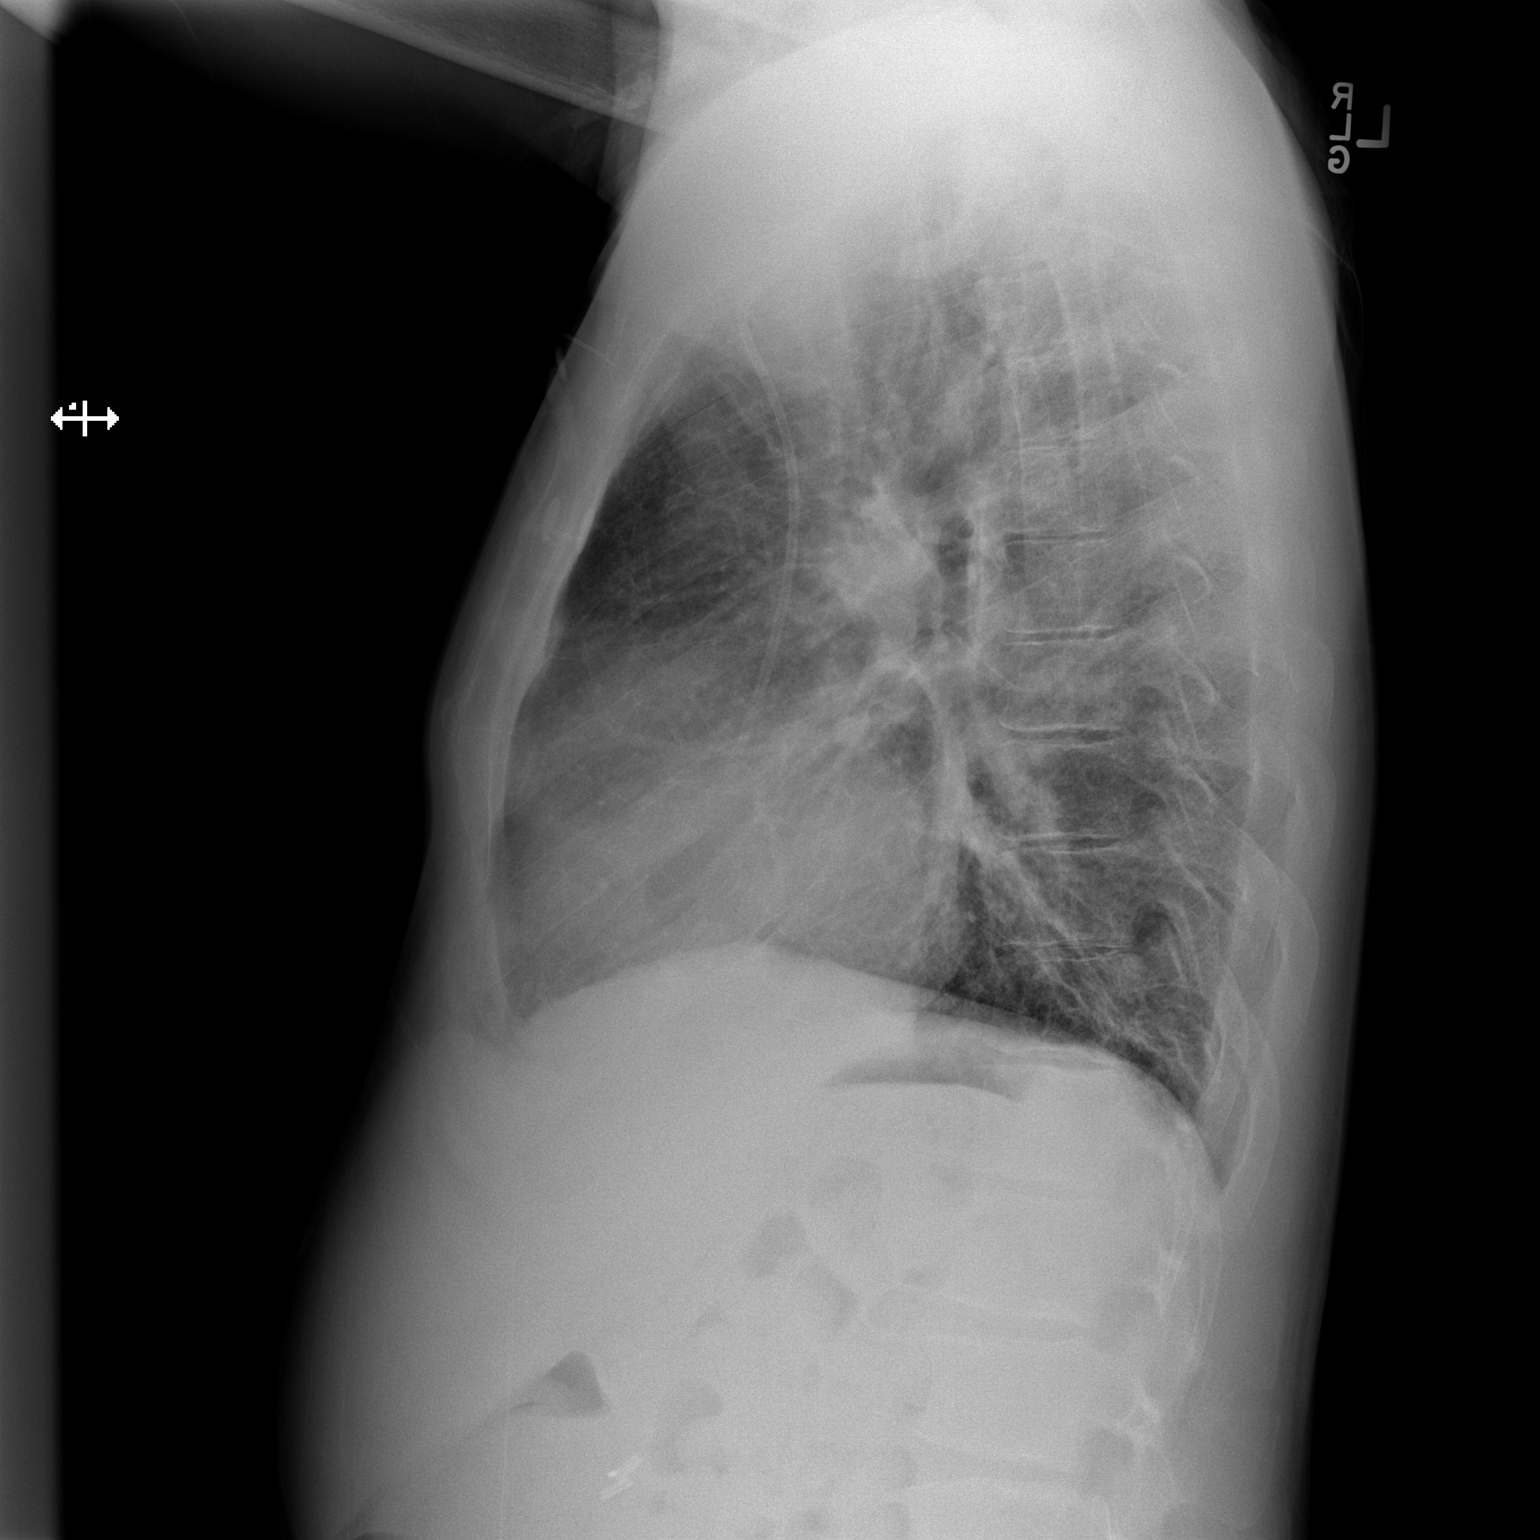

[2 of 2 positions shown; findings below may reference images not displayed]

FINDINGS: Left chest port remains in place. Cardiomegaly and prominent hila
are unchanged. Pulmonary vasculature is normal, no pulmonary edema.
Scarring predominantly in the lower lobes, unchanged from prior
exam. No consolidation. No pleural effusion or pneumothorax. No
acute osseous abnormalities are seen.
IMPRESSION: Stable chronic change, no acute pulmonary process.

## 2017-03-22 IMAGING — CR DG CHEST 2V
2 series · 2 of 2 positions shown · non-contrast
Comparison: 04/24/2014 .

CLINICAL DATA: Sickle cell crisis.  Pain.

EXAM:
CHEST  2 VIEW

[w chest pa]
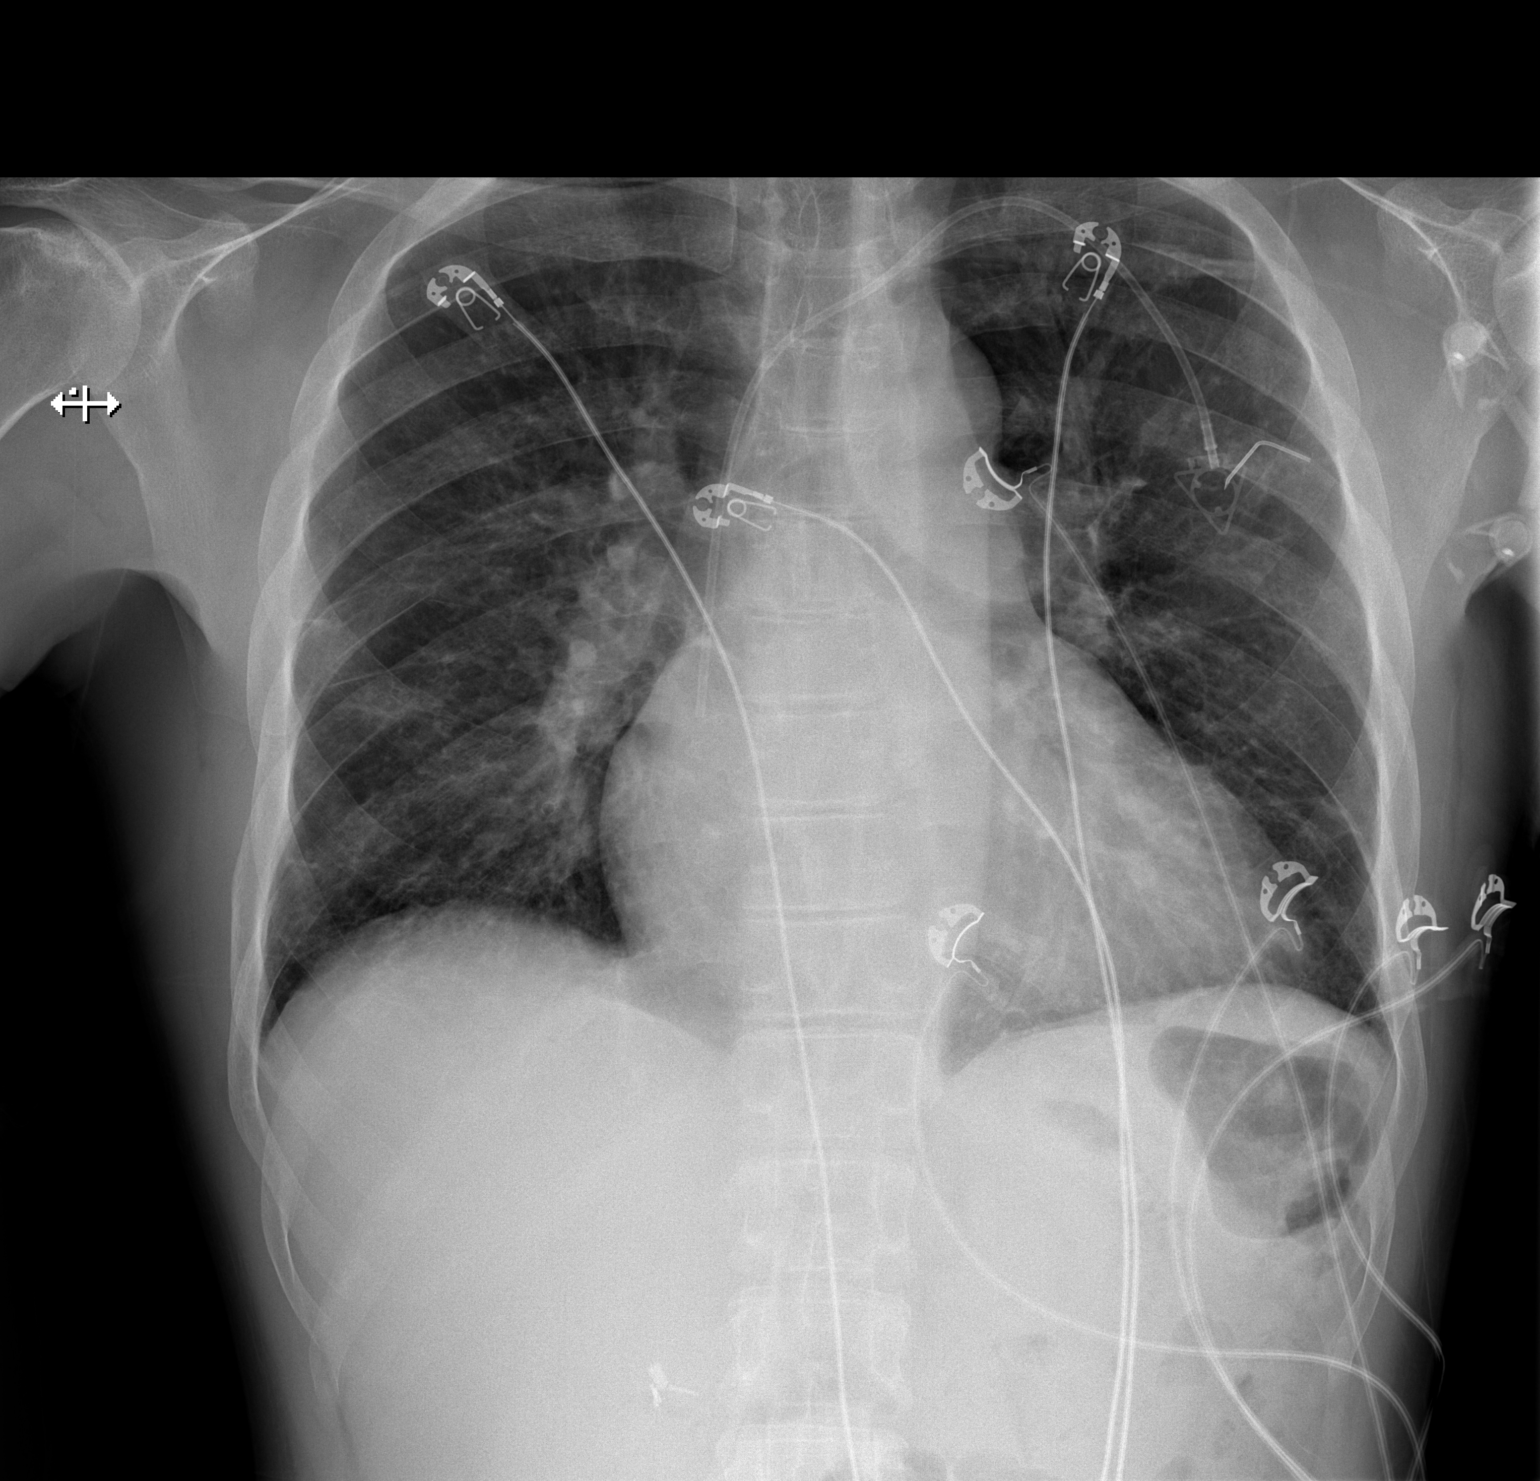

[w chest lat]
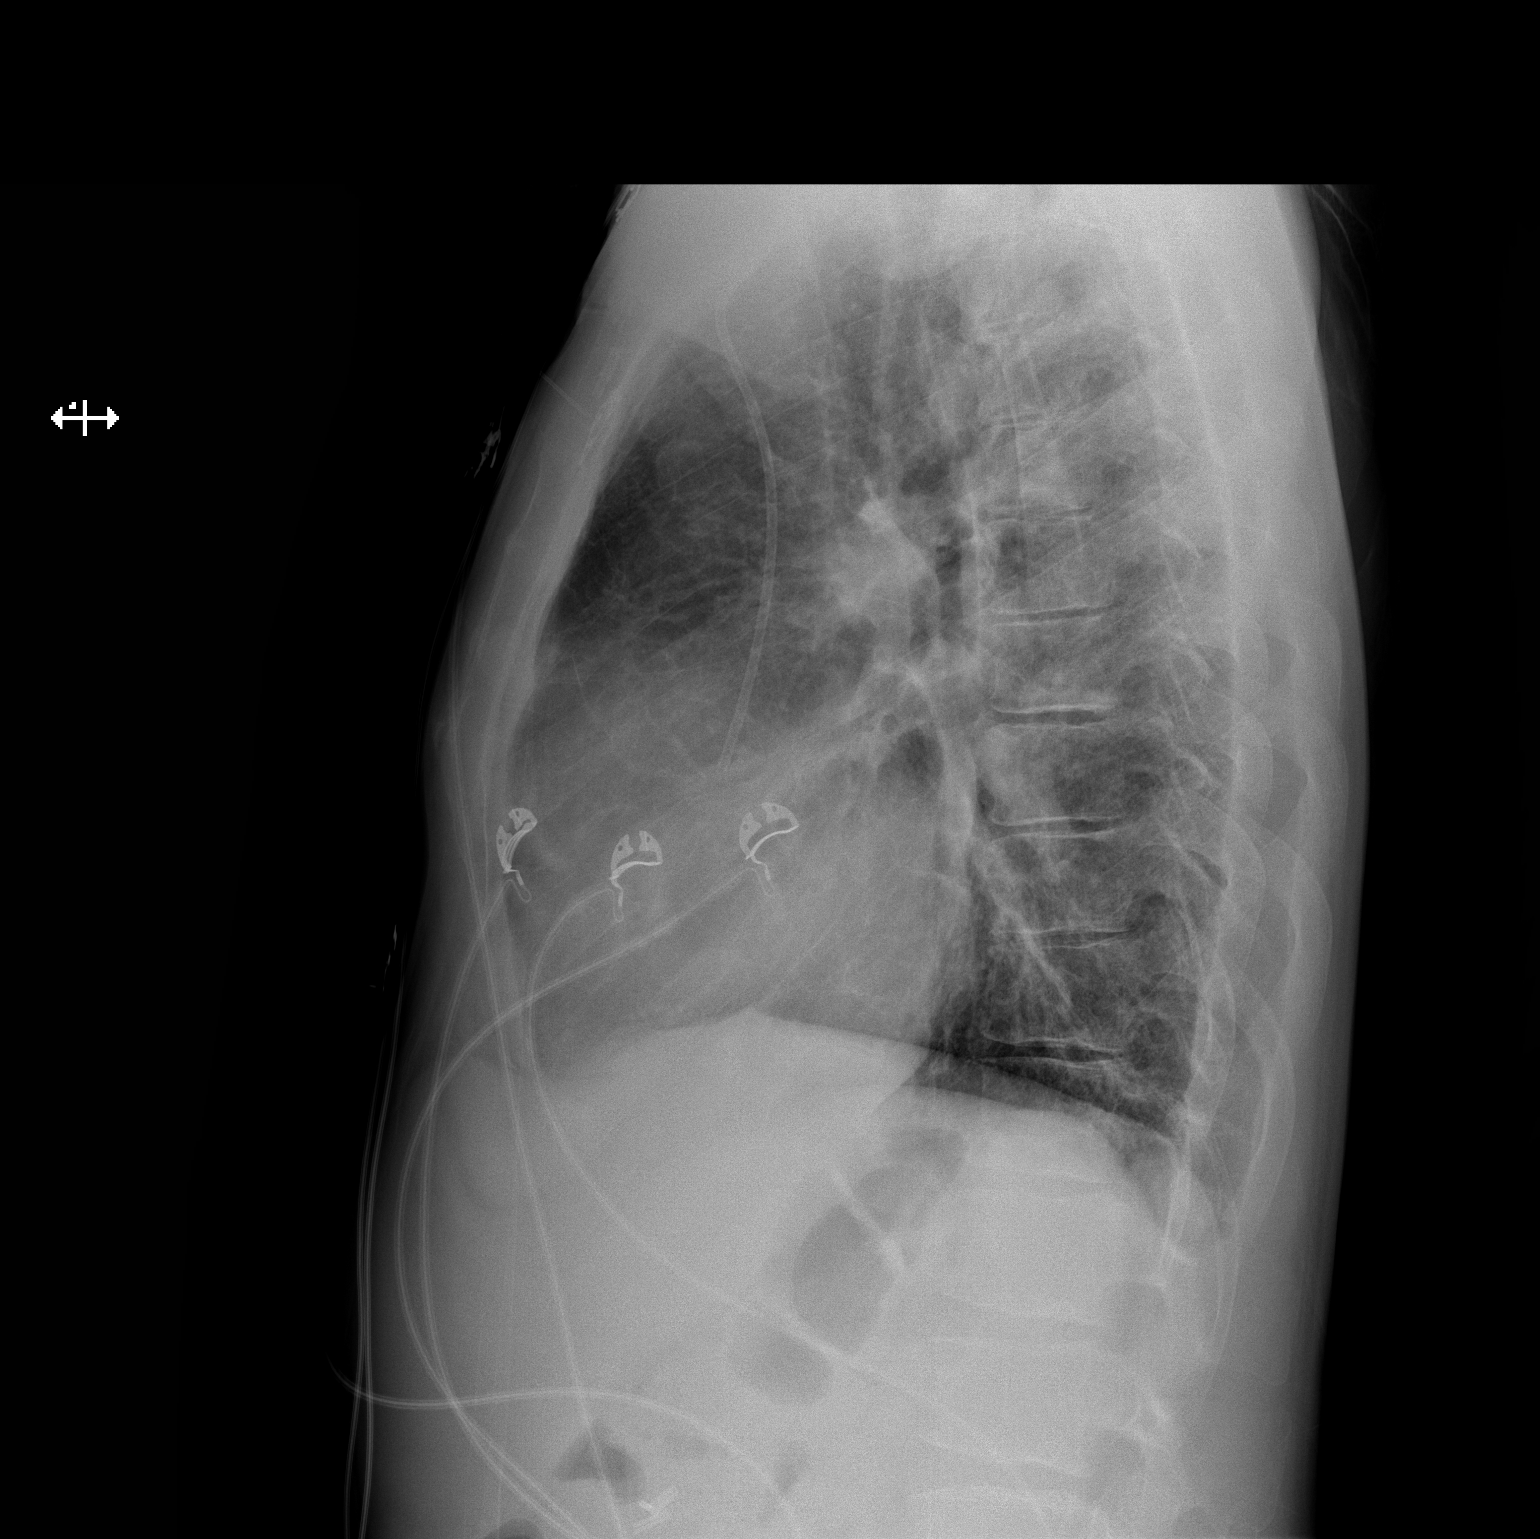

[2 of 2 positions shown; findings below may reference images not displayed]

FINDINGS: Power Port catheter noted with lead tip projected over the
cavoatrial junction. Mediastinum and hilar structures normal.
Cardiomegaly. Stable bilateral pleural parenchymal scarring, no
change. No acute infiltrate. No pleural effusion or pneumothorax.
Surgical clips right upper quadrant. No acute bony abnormality.
IMPRESSION: 1. Stable changes of pleural parenchymal scarring. No acute
infiltrate.
2. Stable cardiomegaly.
3. Power port catheter in stable position.

## 2017-04-14 IMAGING — CR DG CHEST 2V
2 series · 2 of 2 positions shown · non-contrast
Comparison: 04/29/2014

CLINICAL DATA: Dry cough for 2 days.

EXAM:
CHEST  2 VIEW

[w chest pa]
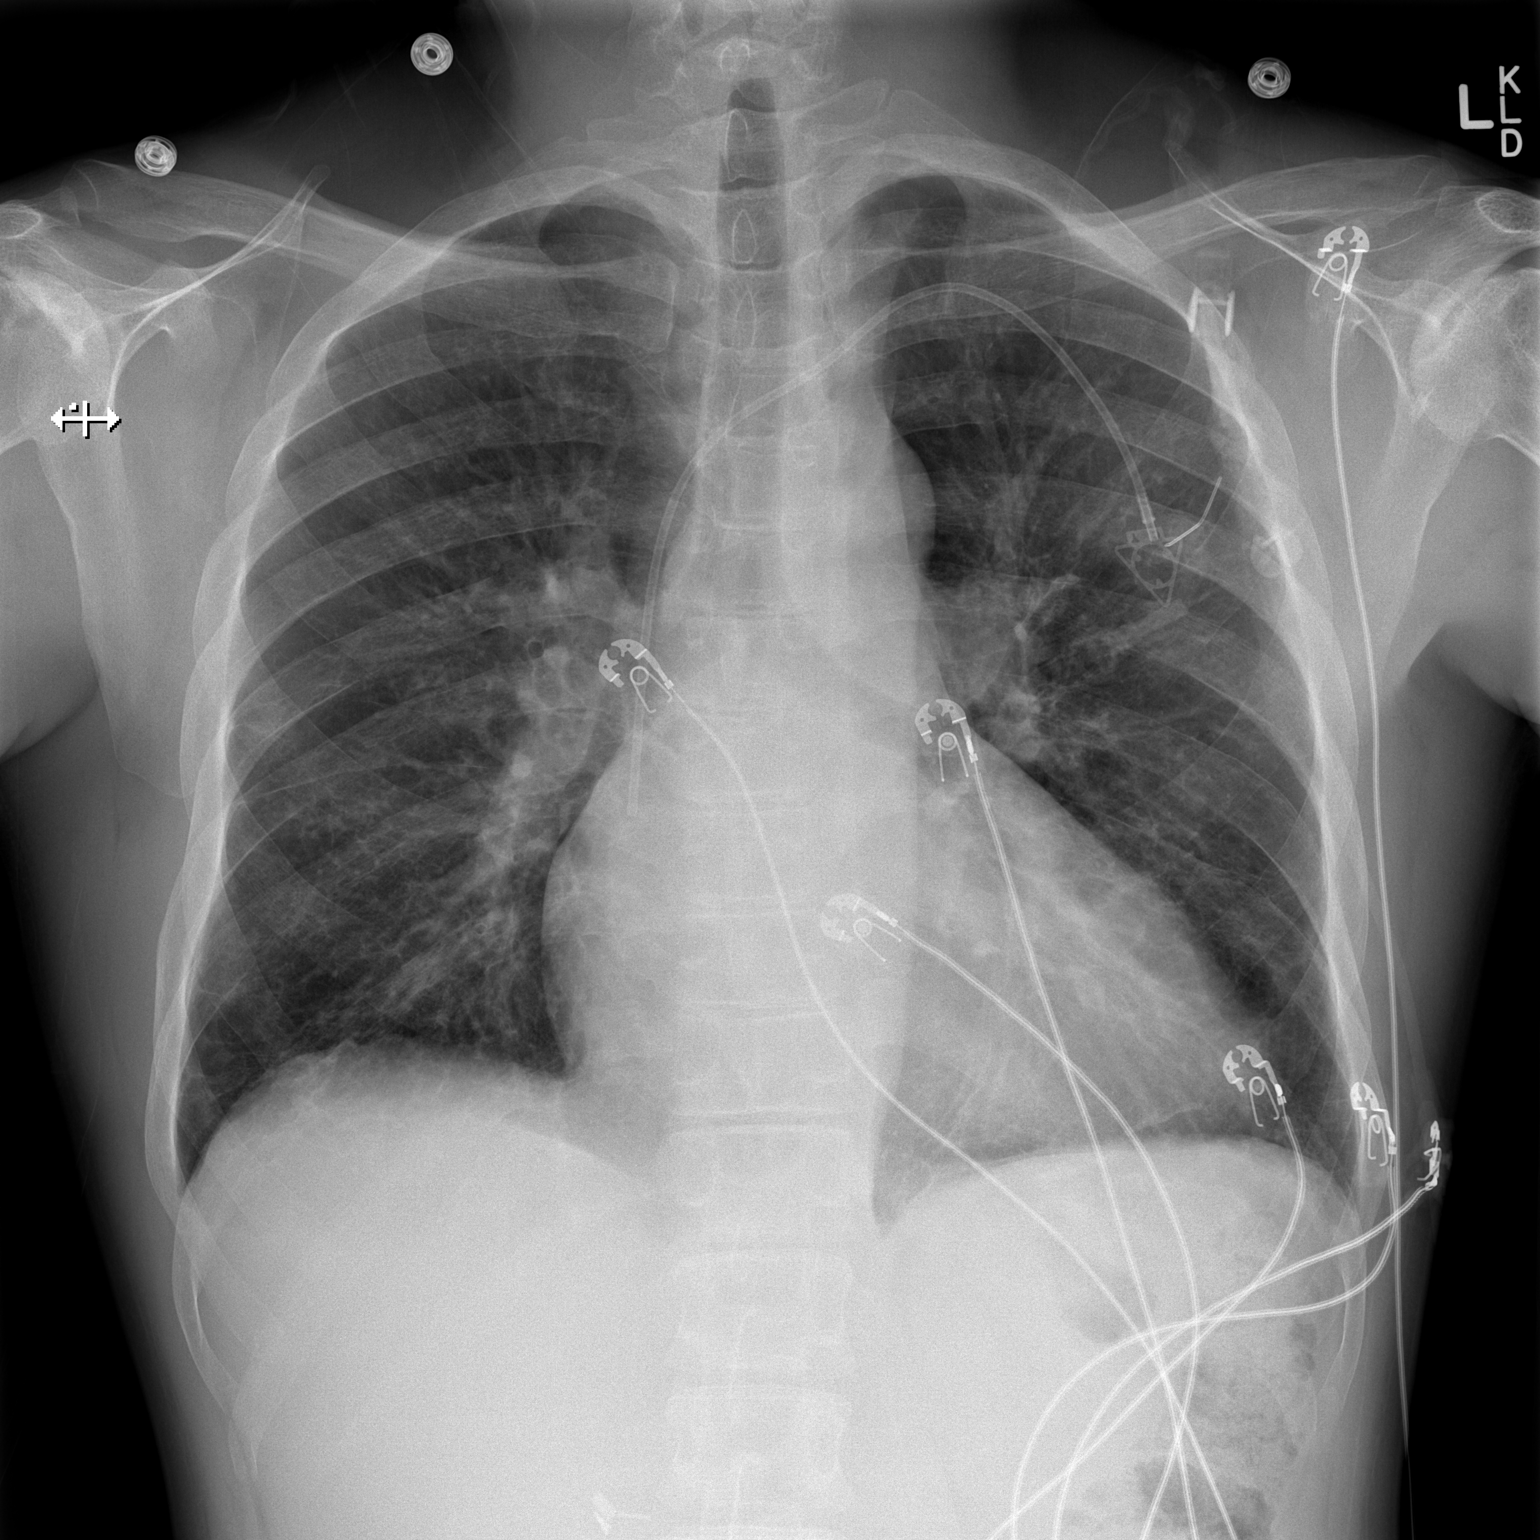

[w chest lat]
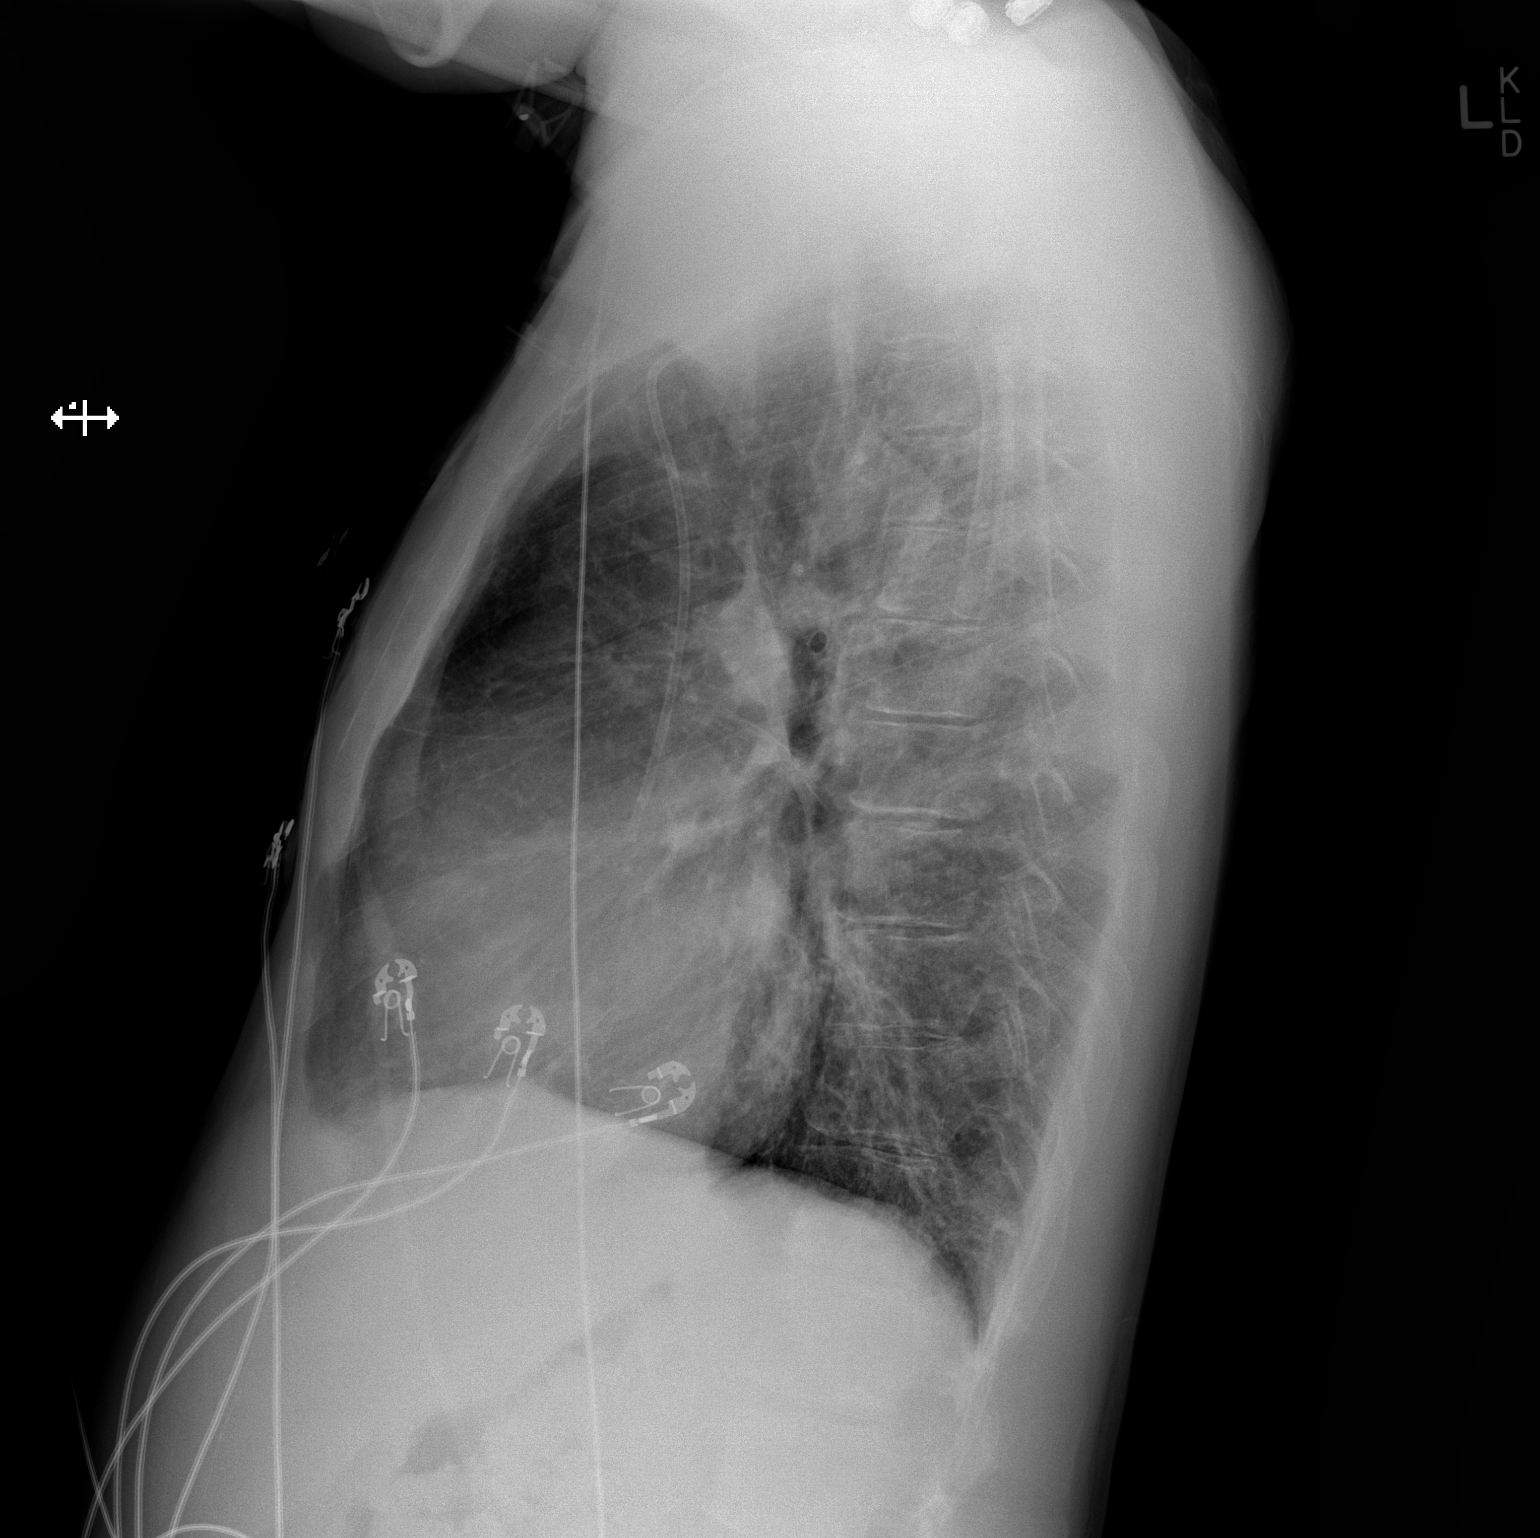

[2 of 2 positions shown; findings below may reference images not displayed]

FINDINGS: Left subclavian porta catheter, tip at the upper cavoatrial
junction.

Stable mild cardiomegaly.  Stable aortic and hilar contours.

Scattered interstitial opacity consistent with mild scarring. There
is no edema, consolidation, effusion, or pneumothorax.

Bilateral humeral head osteonecrosis from sickle cell disease.
IMPRESSION: 1. No active cardiopulmonary disease.
2. Cardiomegaly and mild lung scarring.

## 2017-04-20 IMAGING — CR DG CHEST 2V
2 series · 2 of 2 positions shown · non-contrast
Comparison: 05/22/2014 and 04/29/2014.

CLINICAL DATA: Bilateral lower rib pain and productive cough,
sickle cell patient.

EXAM:
CHEST  2 VIEW

[w chest pa]
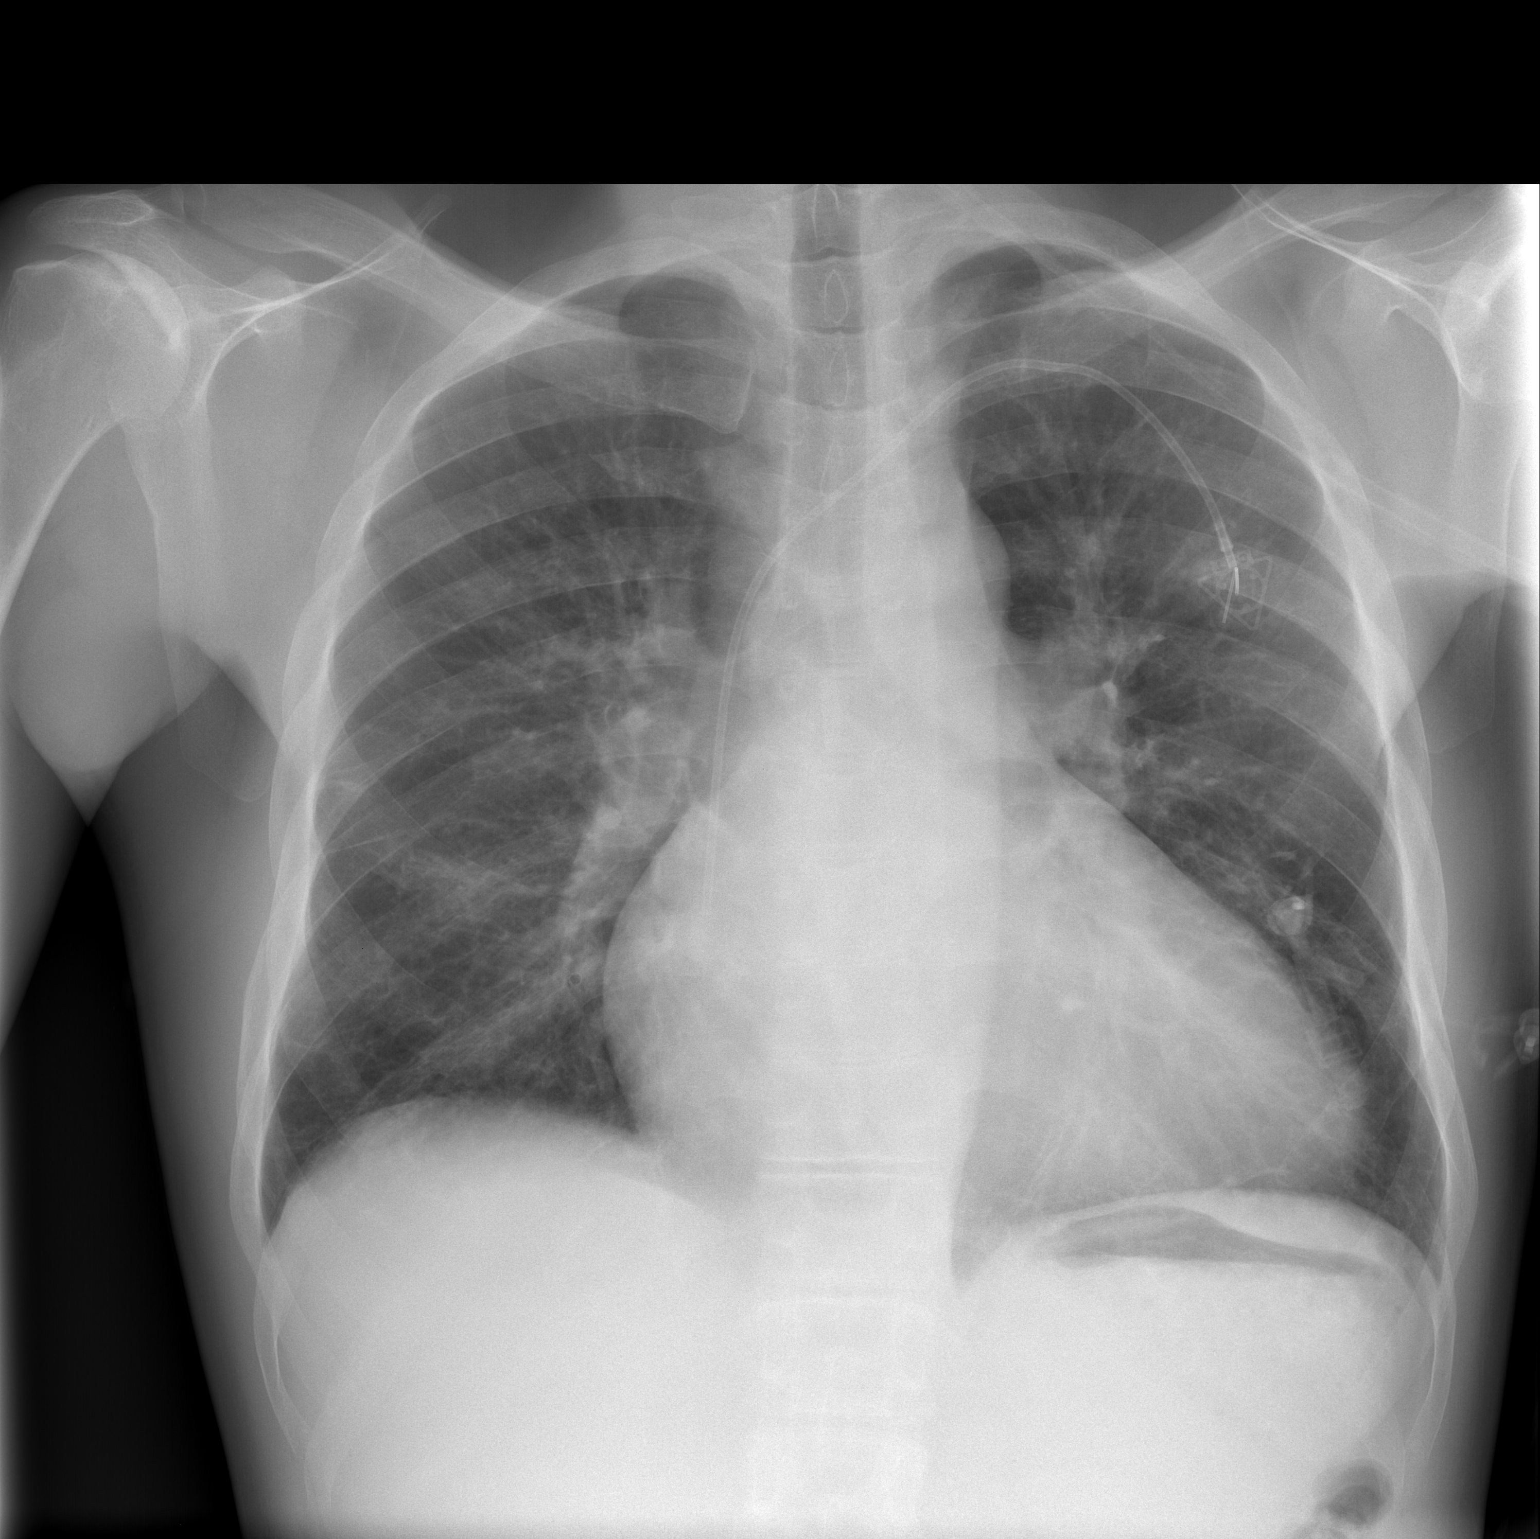

[w chest lat]
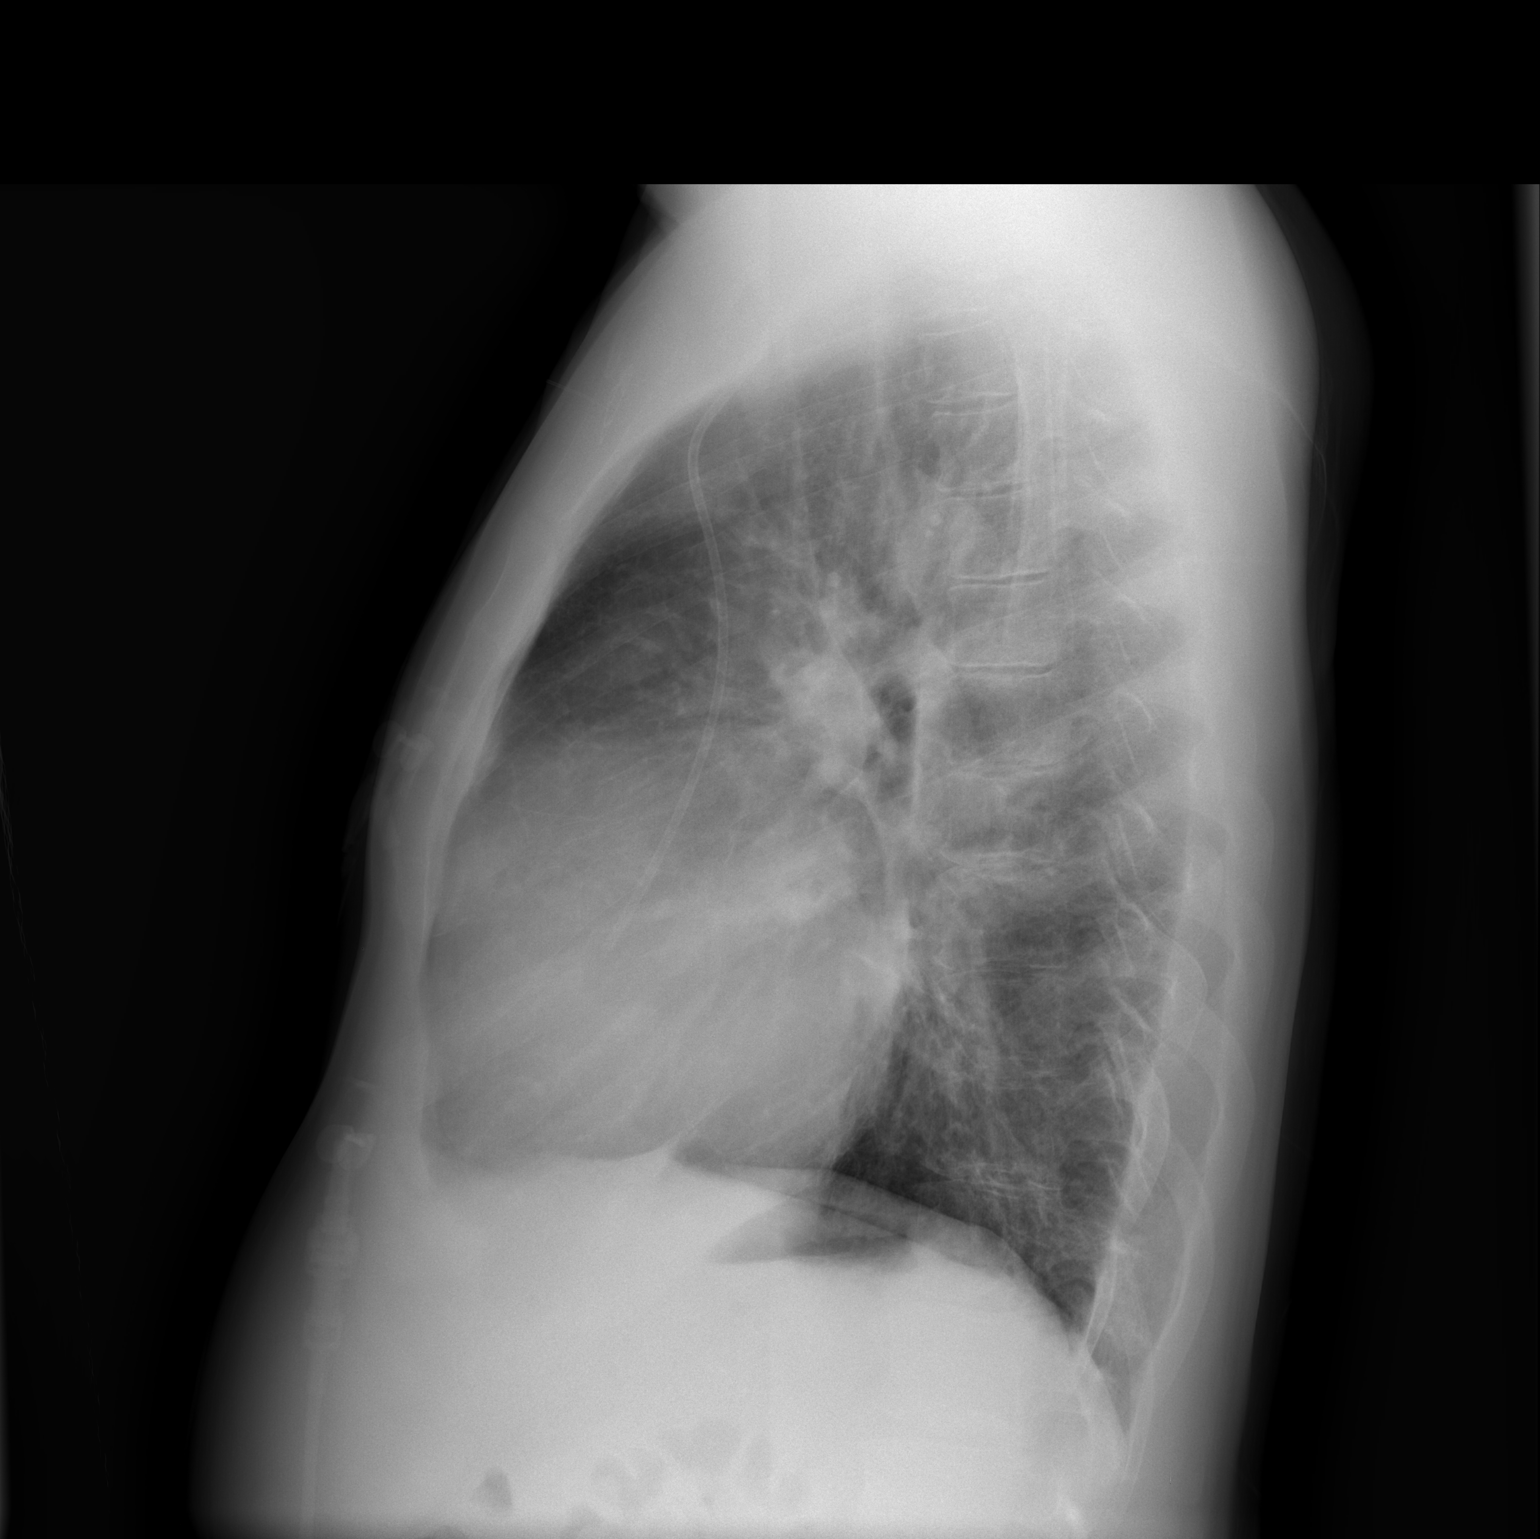

[2 of 2 positions shown; findings below may reference images not displayed]

FINDINGS: Trachea is midline. Heart is enlarged. Left subclavian Port-A-Cath
terminates in the high right atrium. Scattered pleural parenchymal
scarring. No new areas of consolidation. No pleural fluid.
IMPRESSION: No acute findings.

## 2017-04-27 IMAGING — CR DG SHOULDER 2+V*L*
3 series · 3 of 3 positions shown · non-contrast
Comparison: 10/25/2012

CLINICAL DATA: LEFT shoulder pain for 2-3 days, sickle cell
disease, no known injury

EXAM:
LEFT SHOULDER - 2+ VIEW

[w shoulder ap internal left *]
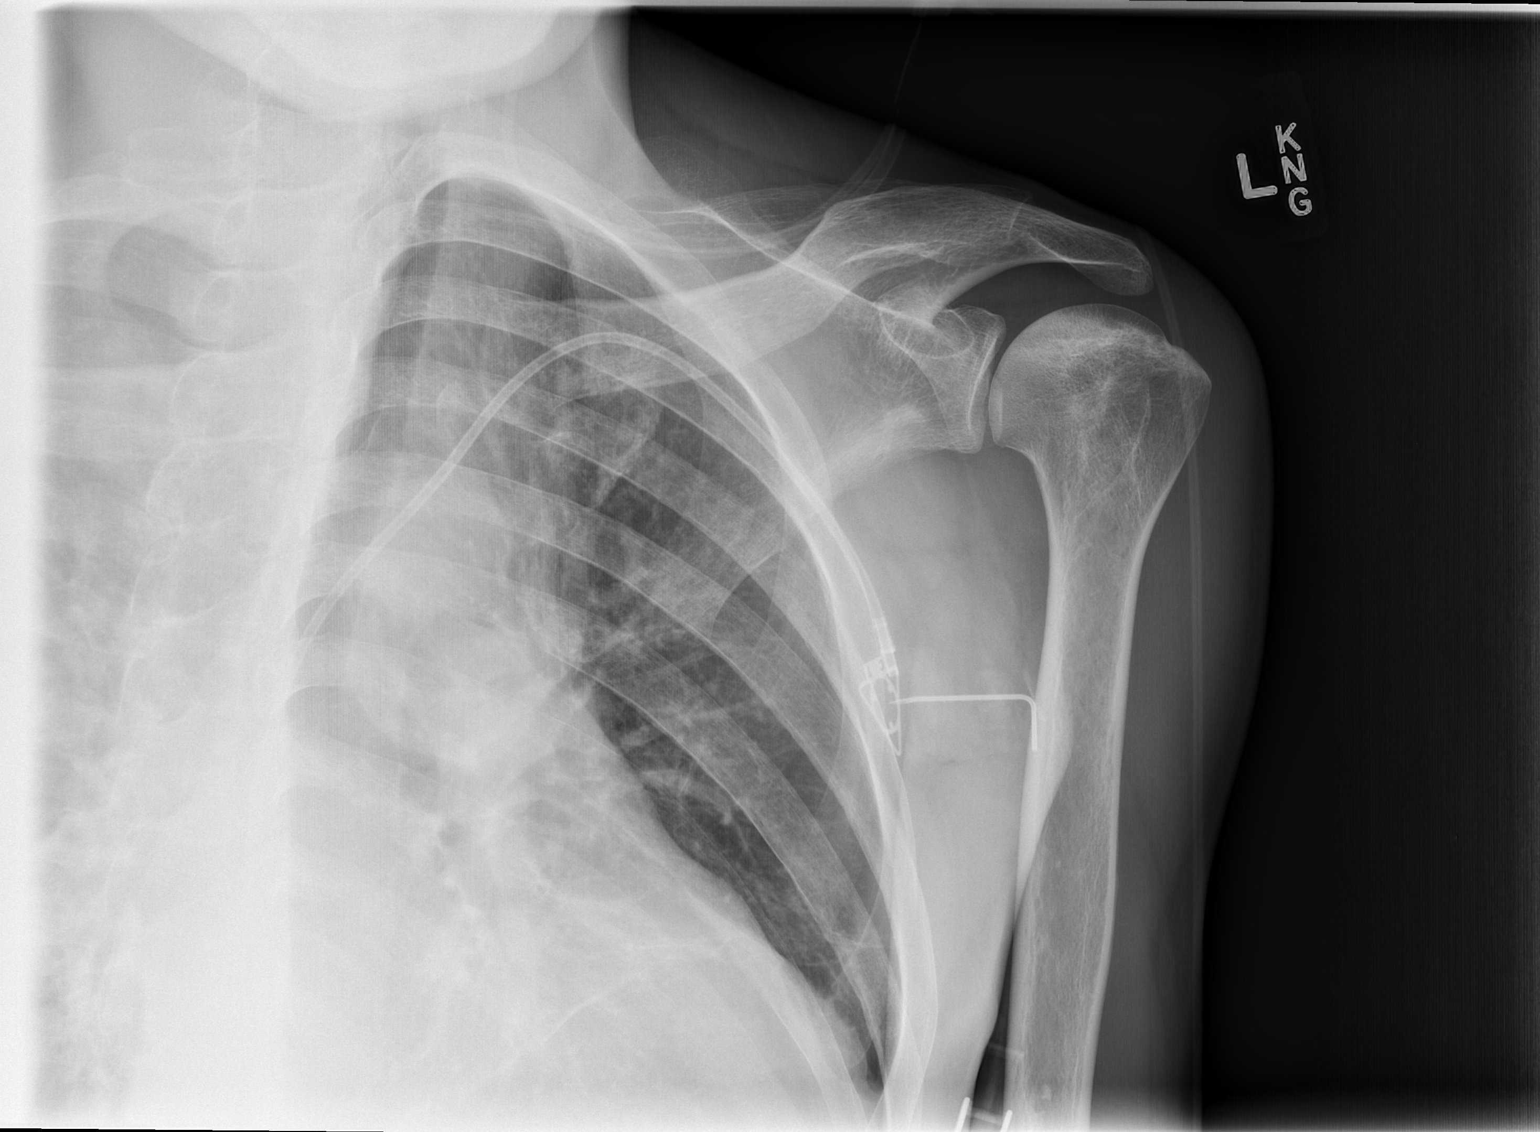

[w shoulder y view left *]
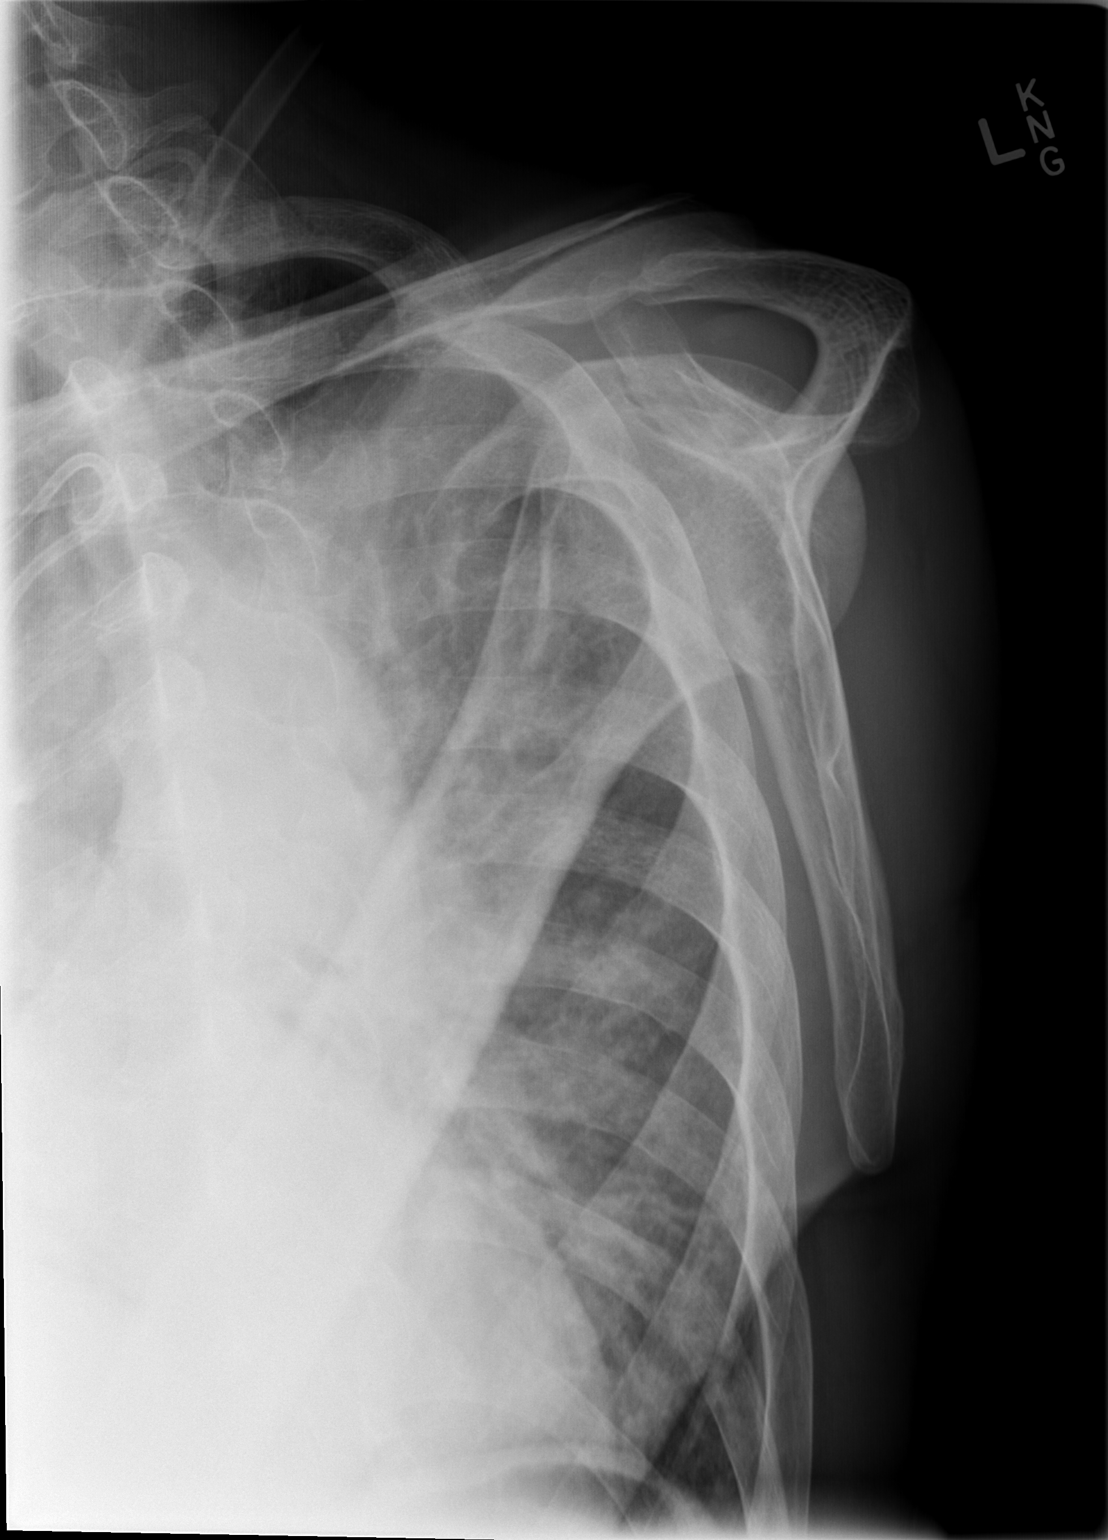

[x shoulder axillary left]
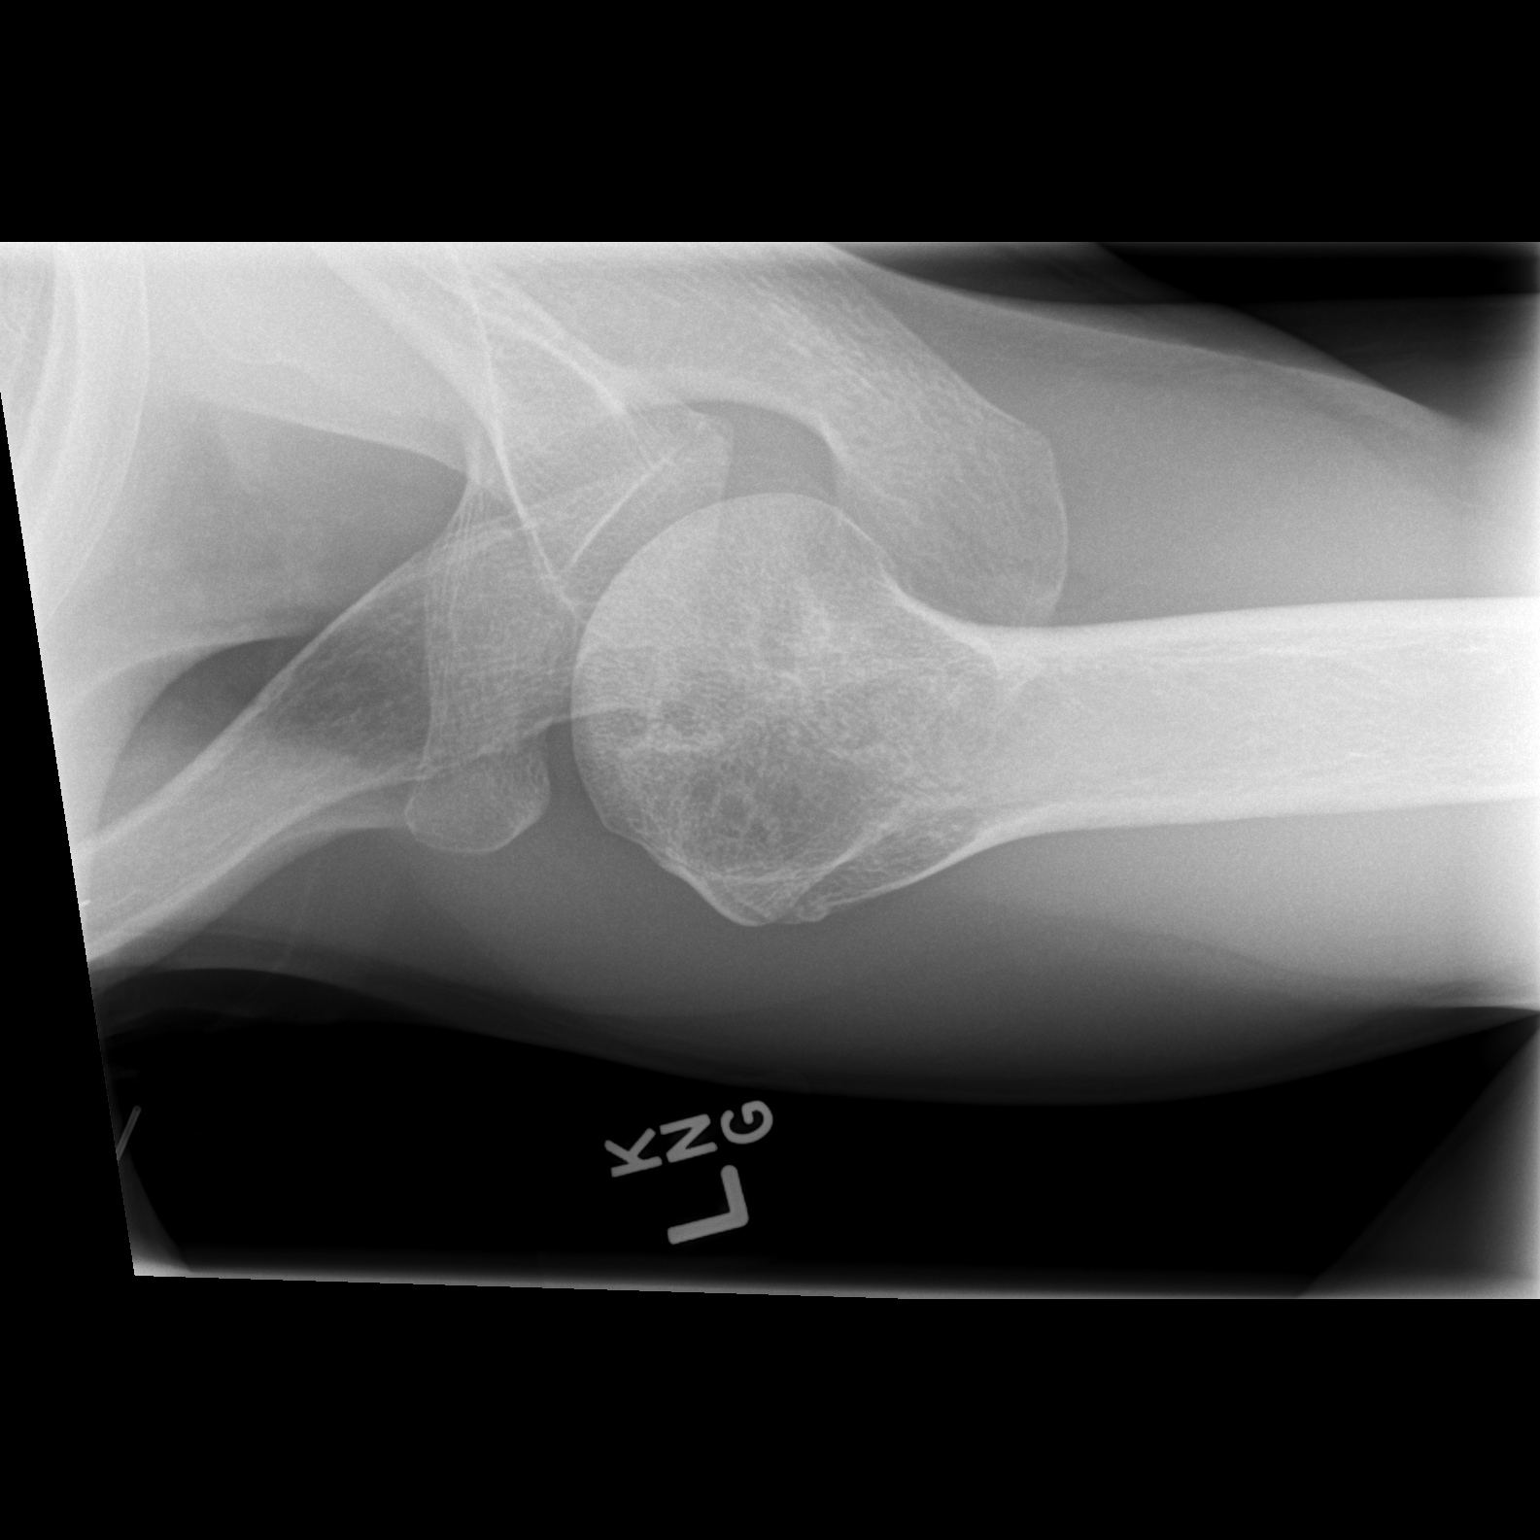

[3 of 3 positions shown; findings below may reference images not displayed]

FINDINGS: AC joint alignment normal.

Sclerosis identified sat uperior aspect of the LEFT humeral head
consistent with avascular necrosis.

Pattern appears similar to that seen on the previous exam.

No acute fracture, dislocation or bone destruction.

No humeral head collapse identified.

LEFT subclavian Port-A-Cath visualized.

No acute LEFT rib abnormalities noted.
IMPRESSION: Avascular necrosis LEFT humeral head unchanged from previous exam.

No acute abnormalities.

## 2017-05-16 IMAGING — CR DG CHEST 2V
2 series · 2 of 2 positions shown · non-contrast
Comparison: Chest x-ray of 06/03/2014

CLINICAL DATA: Left flank pain, weakness, shortness of breath for 1
day, history of sickle cell disease

EXAM:
CHEST  2 VIEW

[w chest pa]
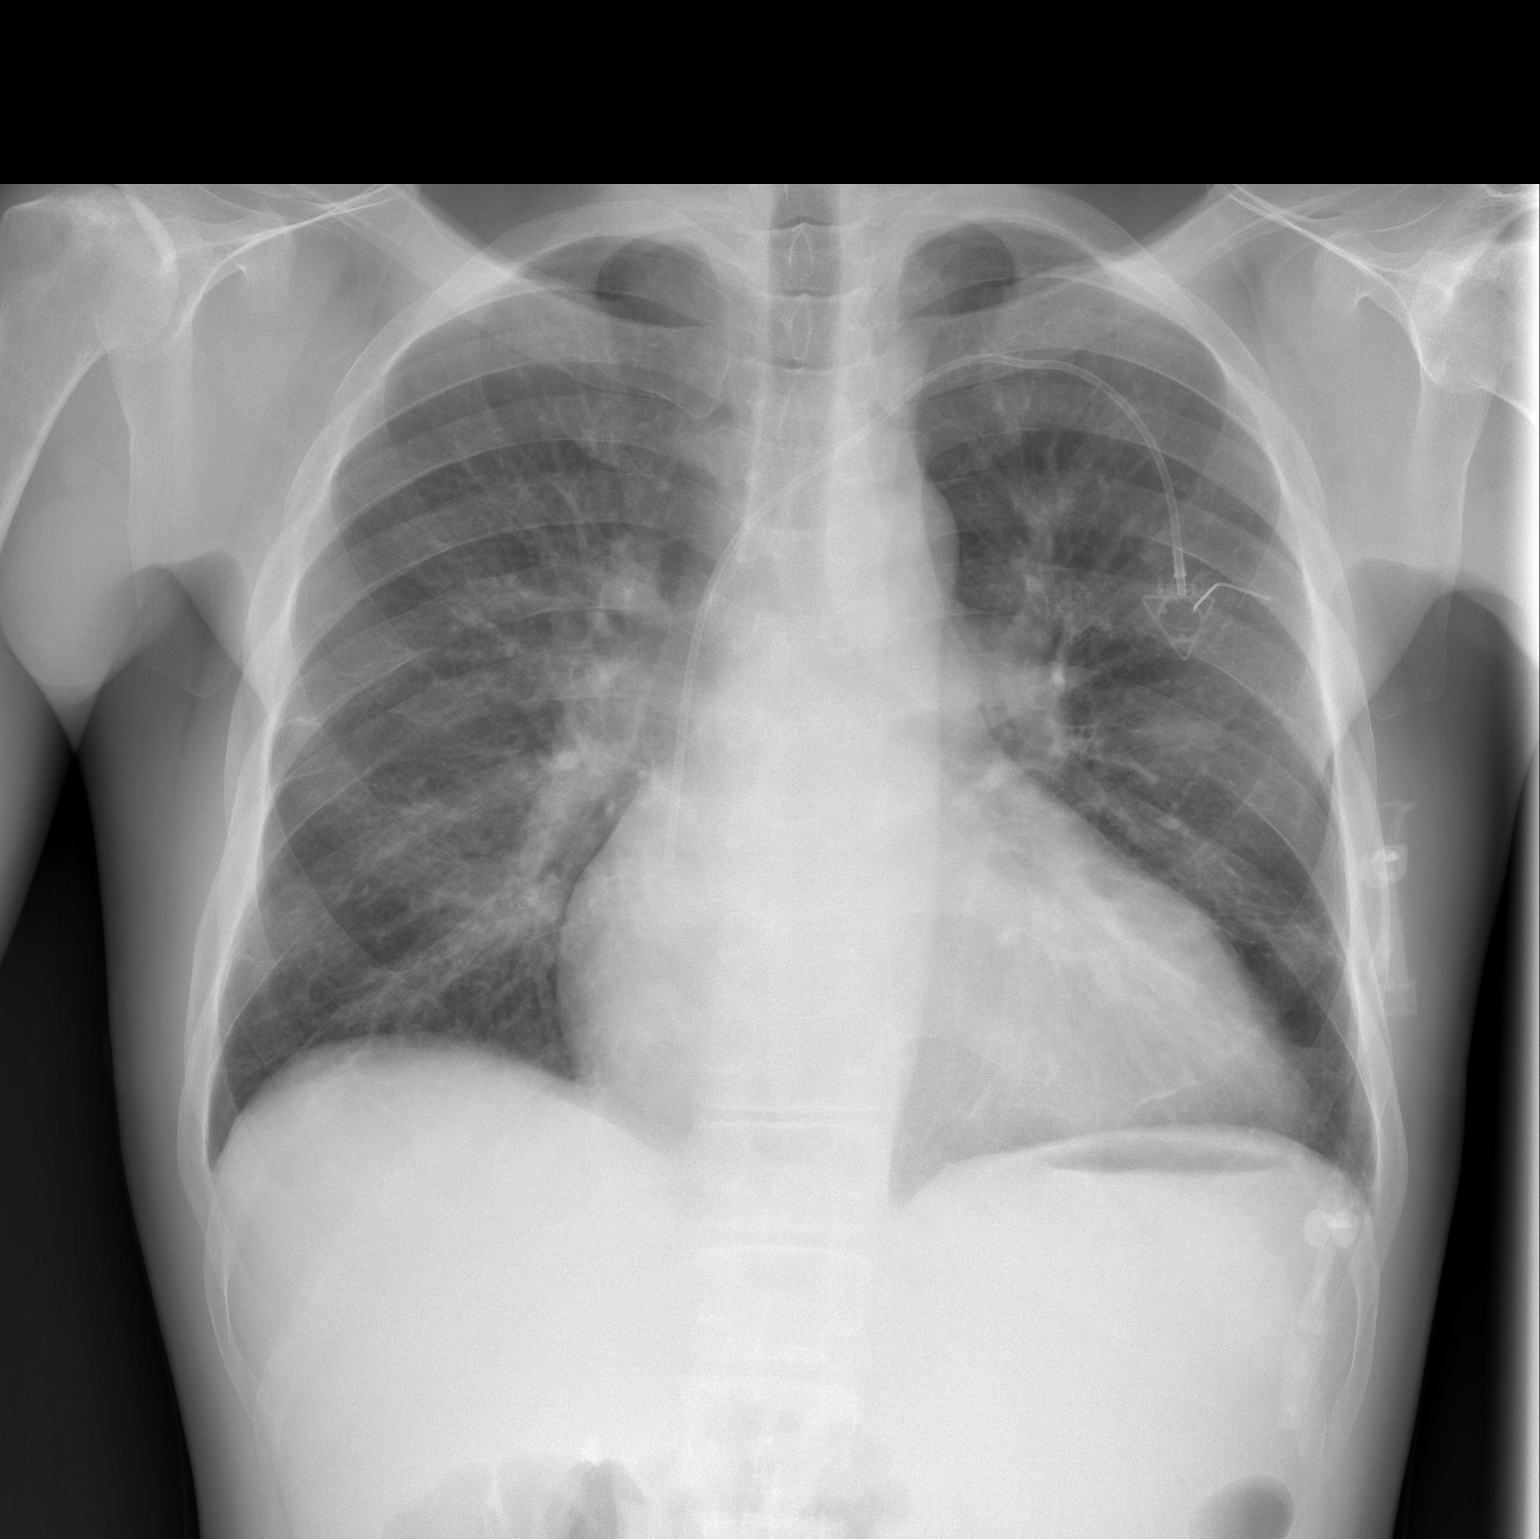

[w chest lat]
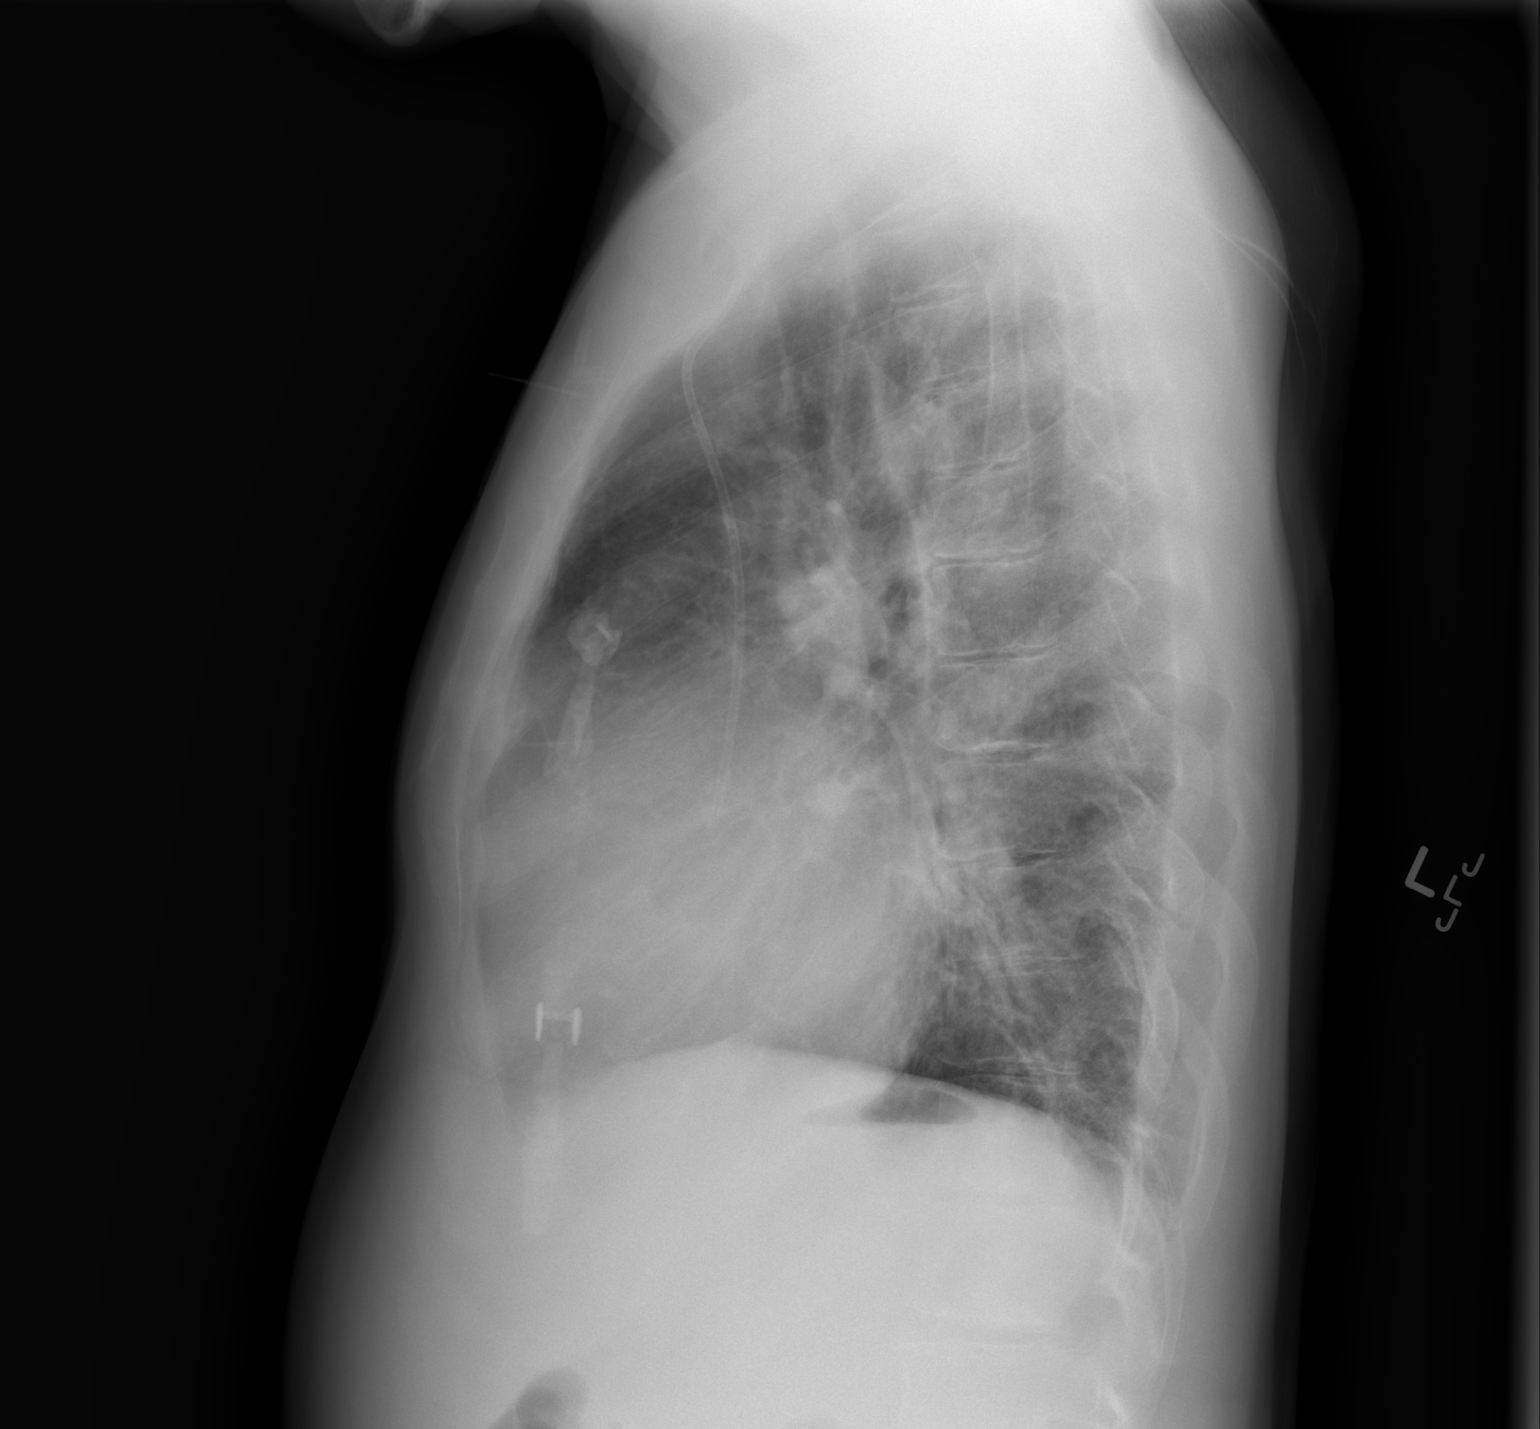

[2 of 2 positions shown; findings below may reference images not displayed]

FINDINGS: There is little change in cardiomegaly with minimal fullness of the
perihilar vasculature. No frank congestive heart failure seen and no
effusion is noted. Left Port-A-Cath is present with the tip seen to
the expected SVC -RA junction. No bony abnormality is noted.
IMPRESSION: No significant change in cardiomegaly and perhaps minimal pulmonary
vascular congestion. No effusion

## 2017-05-18 IMAGING — CR DG CHEST 2V
2 series · 2 of 2 positions shown · non-contrast
Comparison: Chest radiograph June 23, 2014

CLINICAL DATA: Sickle cell crisis, chest pain.

EXAM:
CHEST  2 VIEW

[w chest pa]
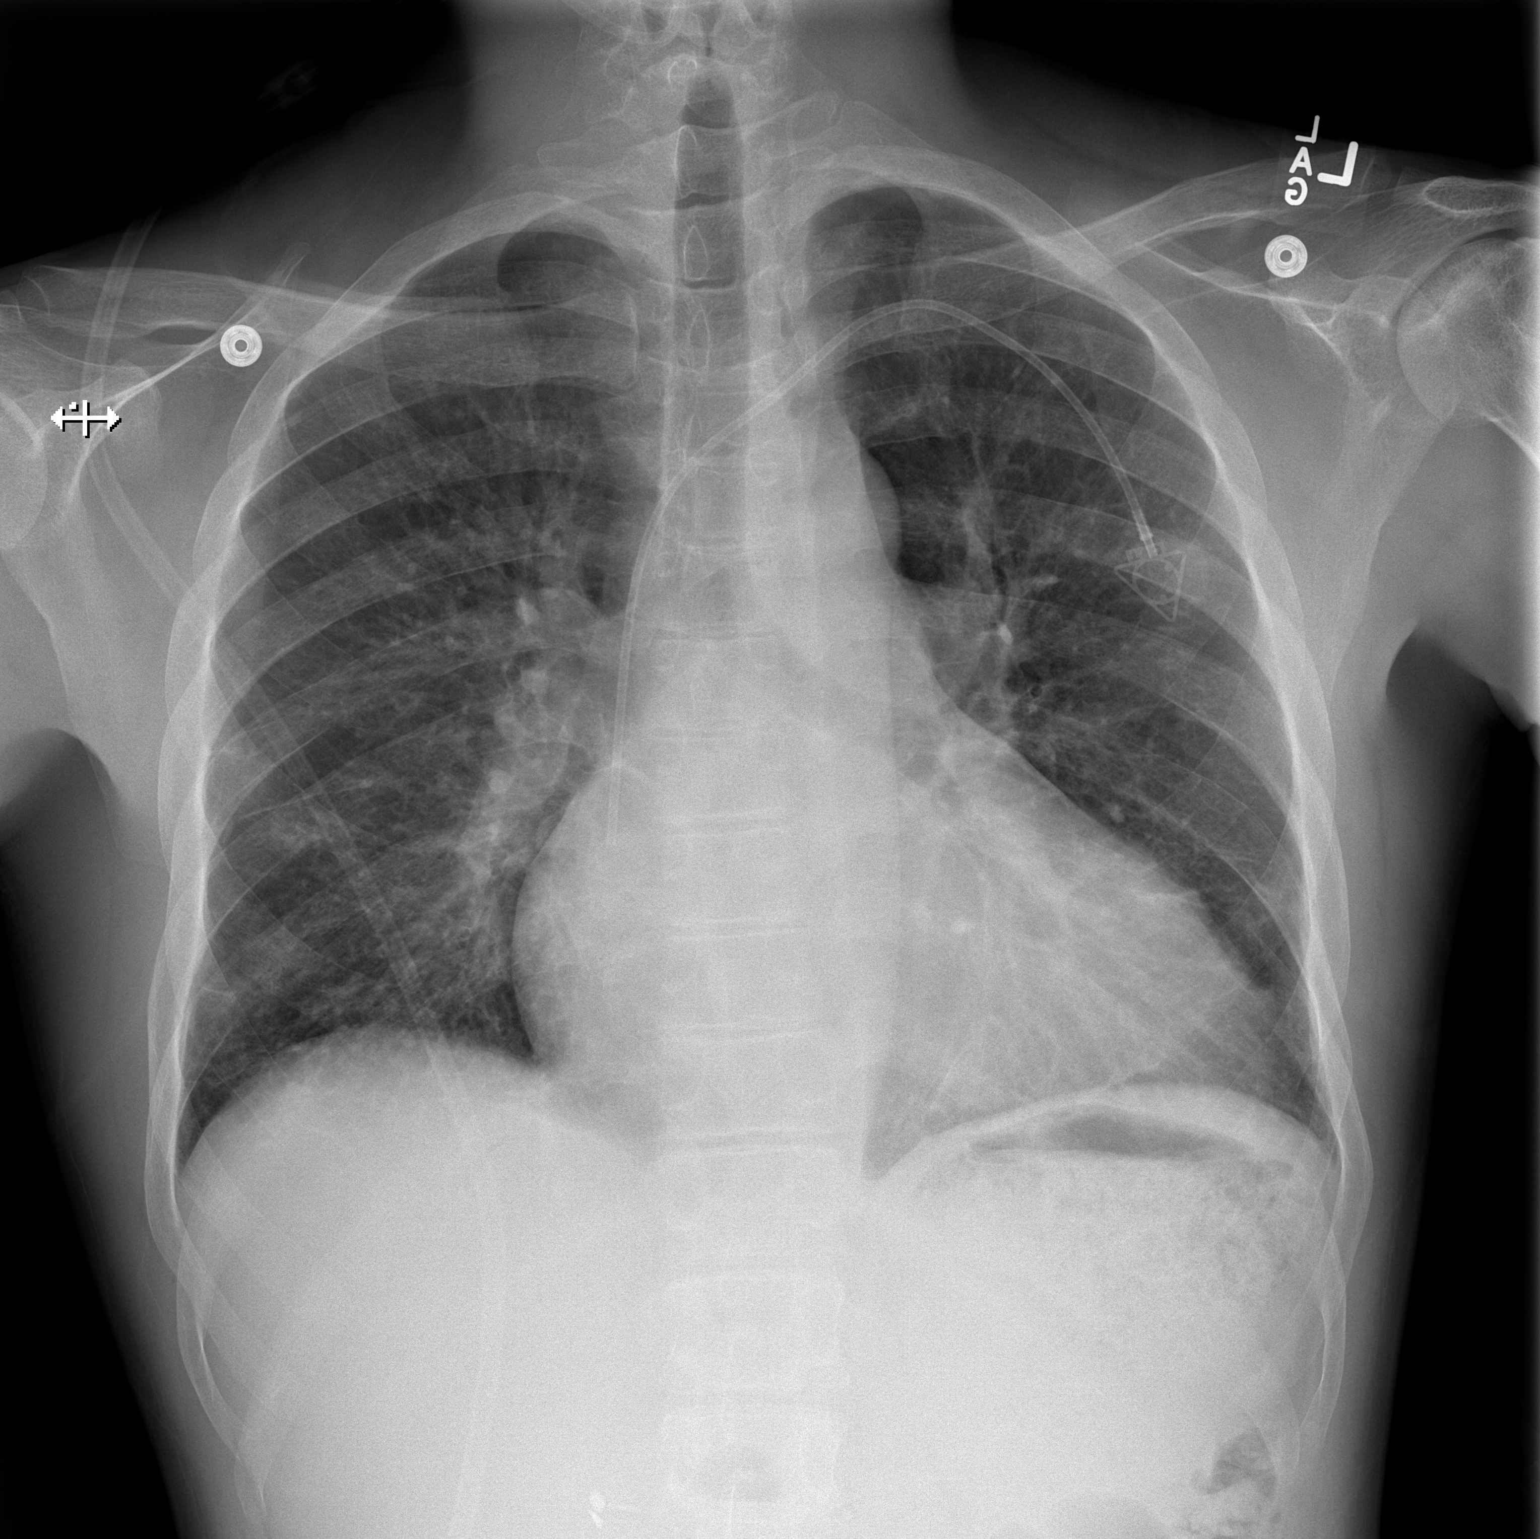

[w chest lat]
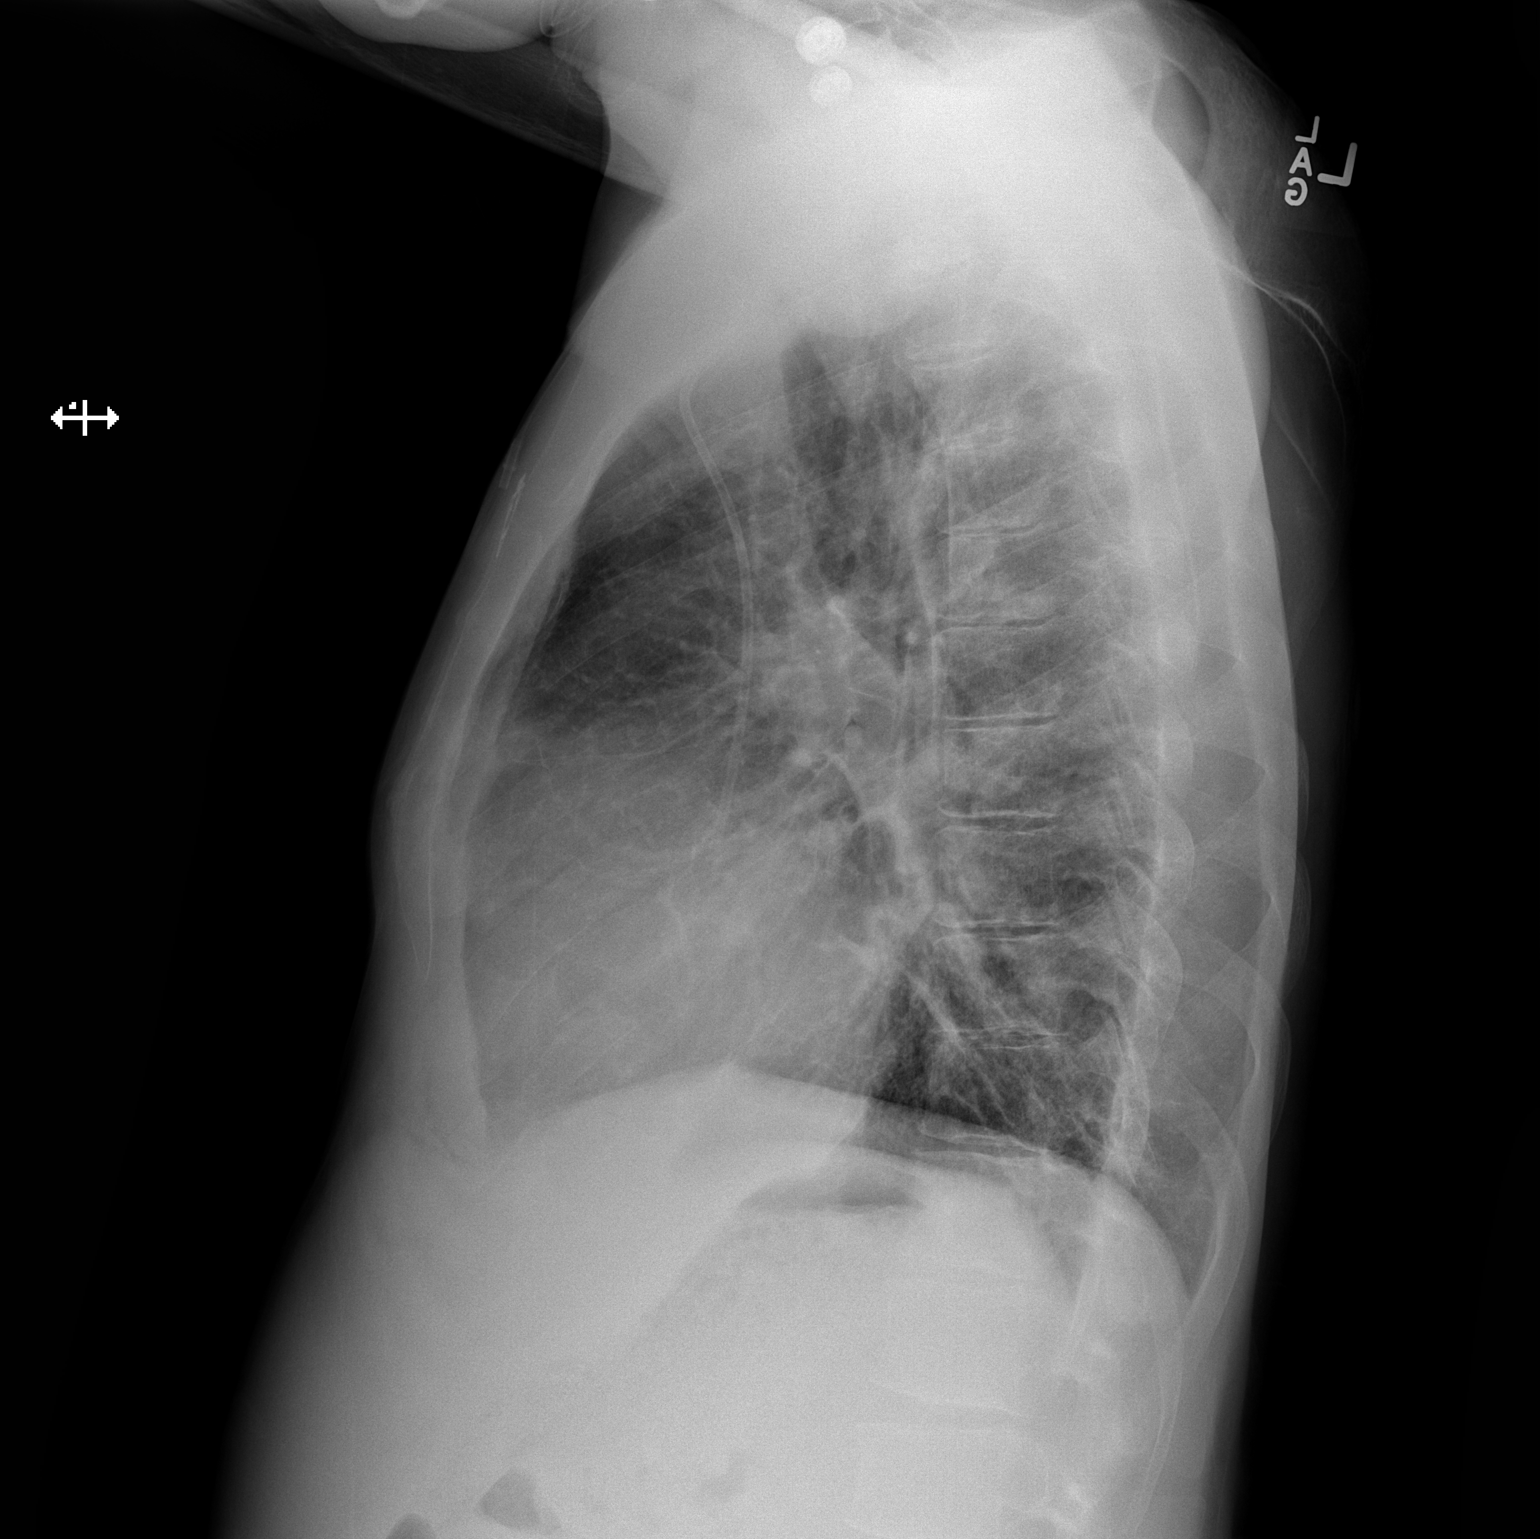

[2 of 2 positions shown; findings below may reference images not displayed]

FINDINGS: Moderate cardiomegaly, unchanged from prior examination. Mediastinal
silhouette is nonsuspicious. Diffuse slightly increasing
interstitial prominence without pleural effusion or focal
consolidation. No pneumothorax. Osseous structures are unchanged
with probable LEFT humeral head avascular necrosis. Single lumen
LEFT chest Port-A-Cath with distal tip at cavoatrial junction.
IMPRESSION: Similar cardiomegaly. Mildly increasing interstitial prominence
(atypical infection or pulmonary edema) without focal consolidation.

## 2017-06-06 IMAGING — CR DG CHEST 2V
2 series · 2 of 2 positions shown · non-contrast
Comparison: 06/25/2014

CLINICAL DATA: Sickle cell pain crisis. Mid to left upper back
pain. Weakness. Started several hours ago.

EXAM:
CHEST  2 VIEW

[w chest pa]
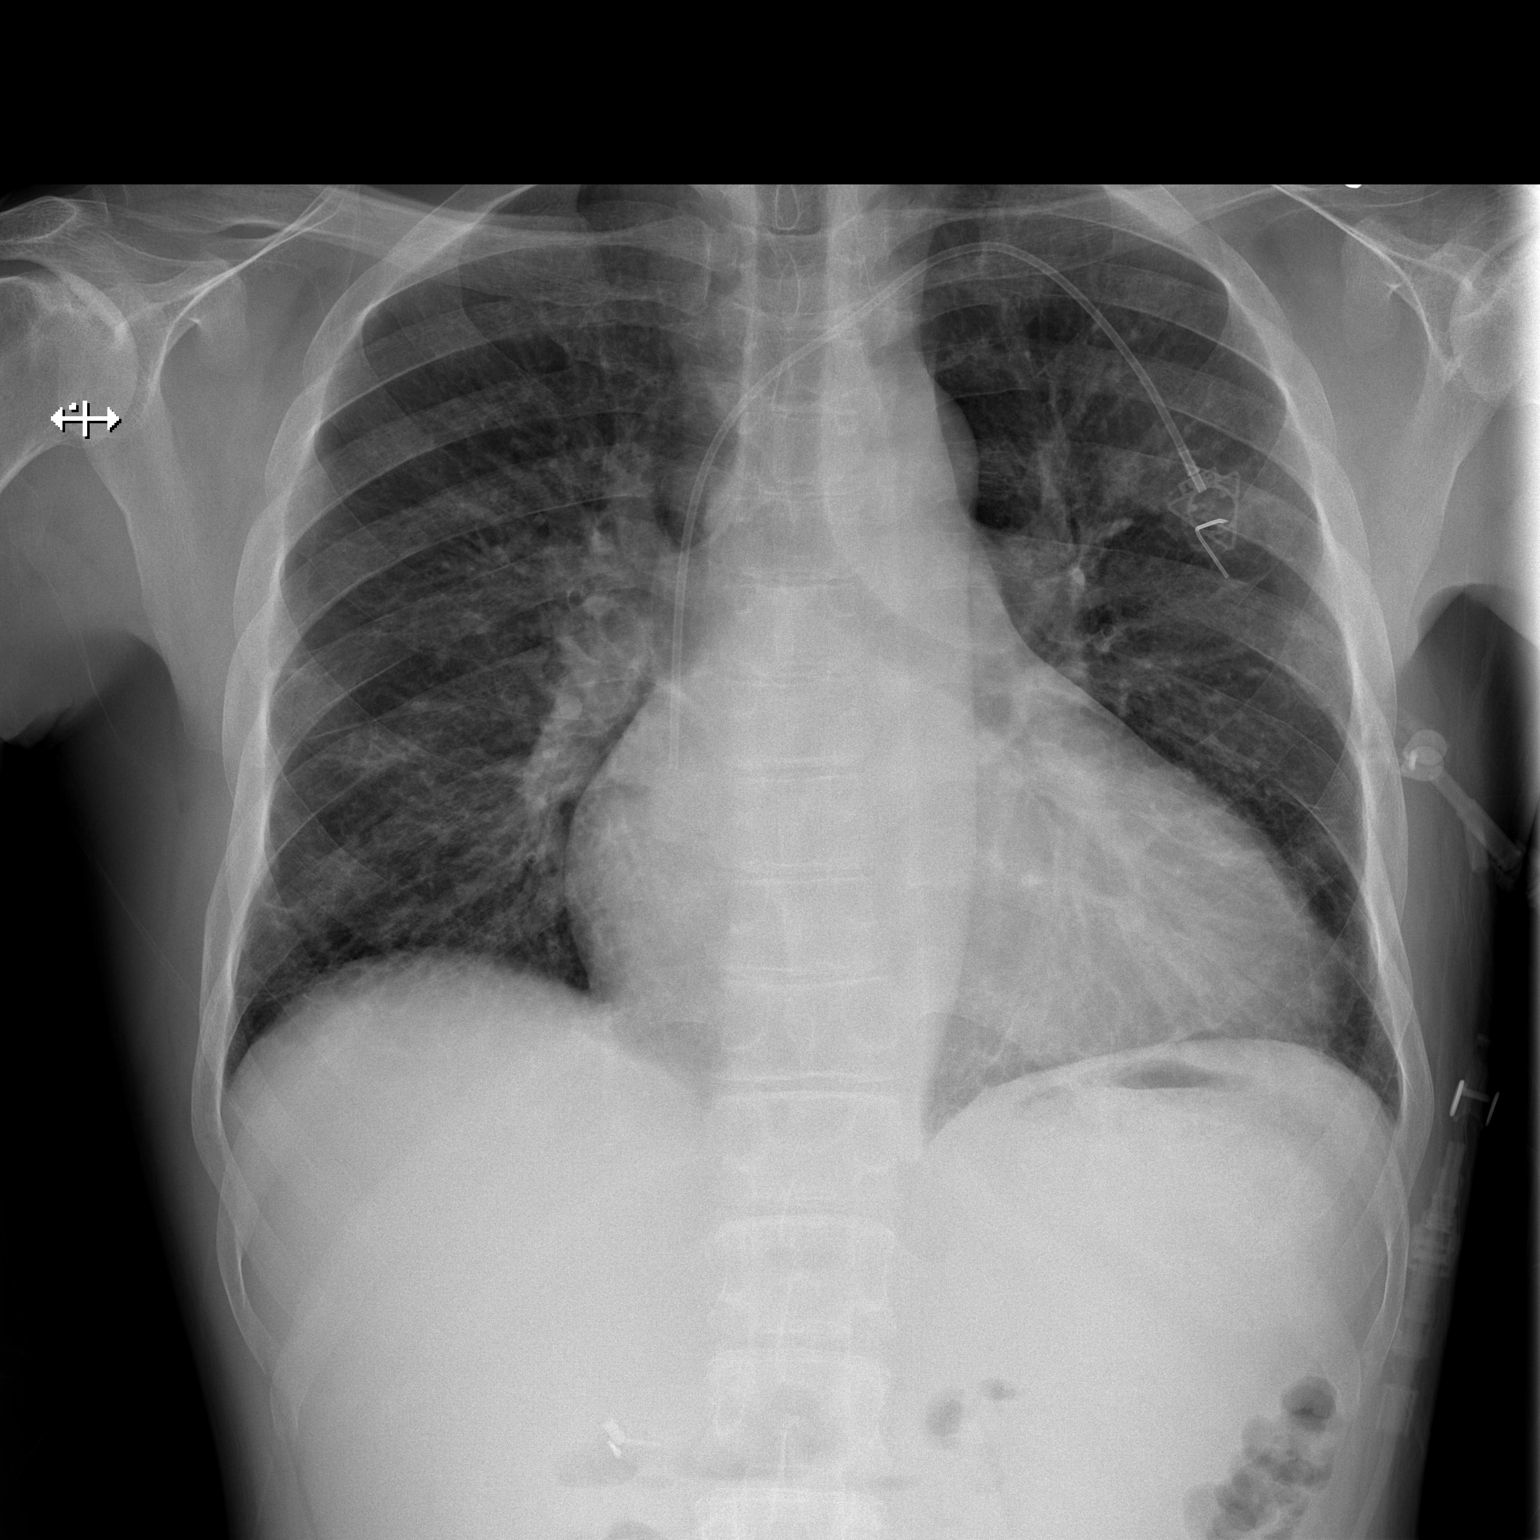

[w chest lat]
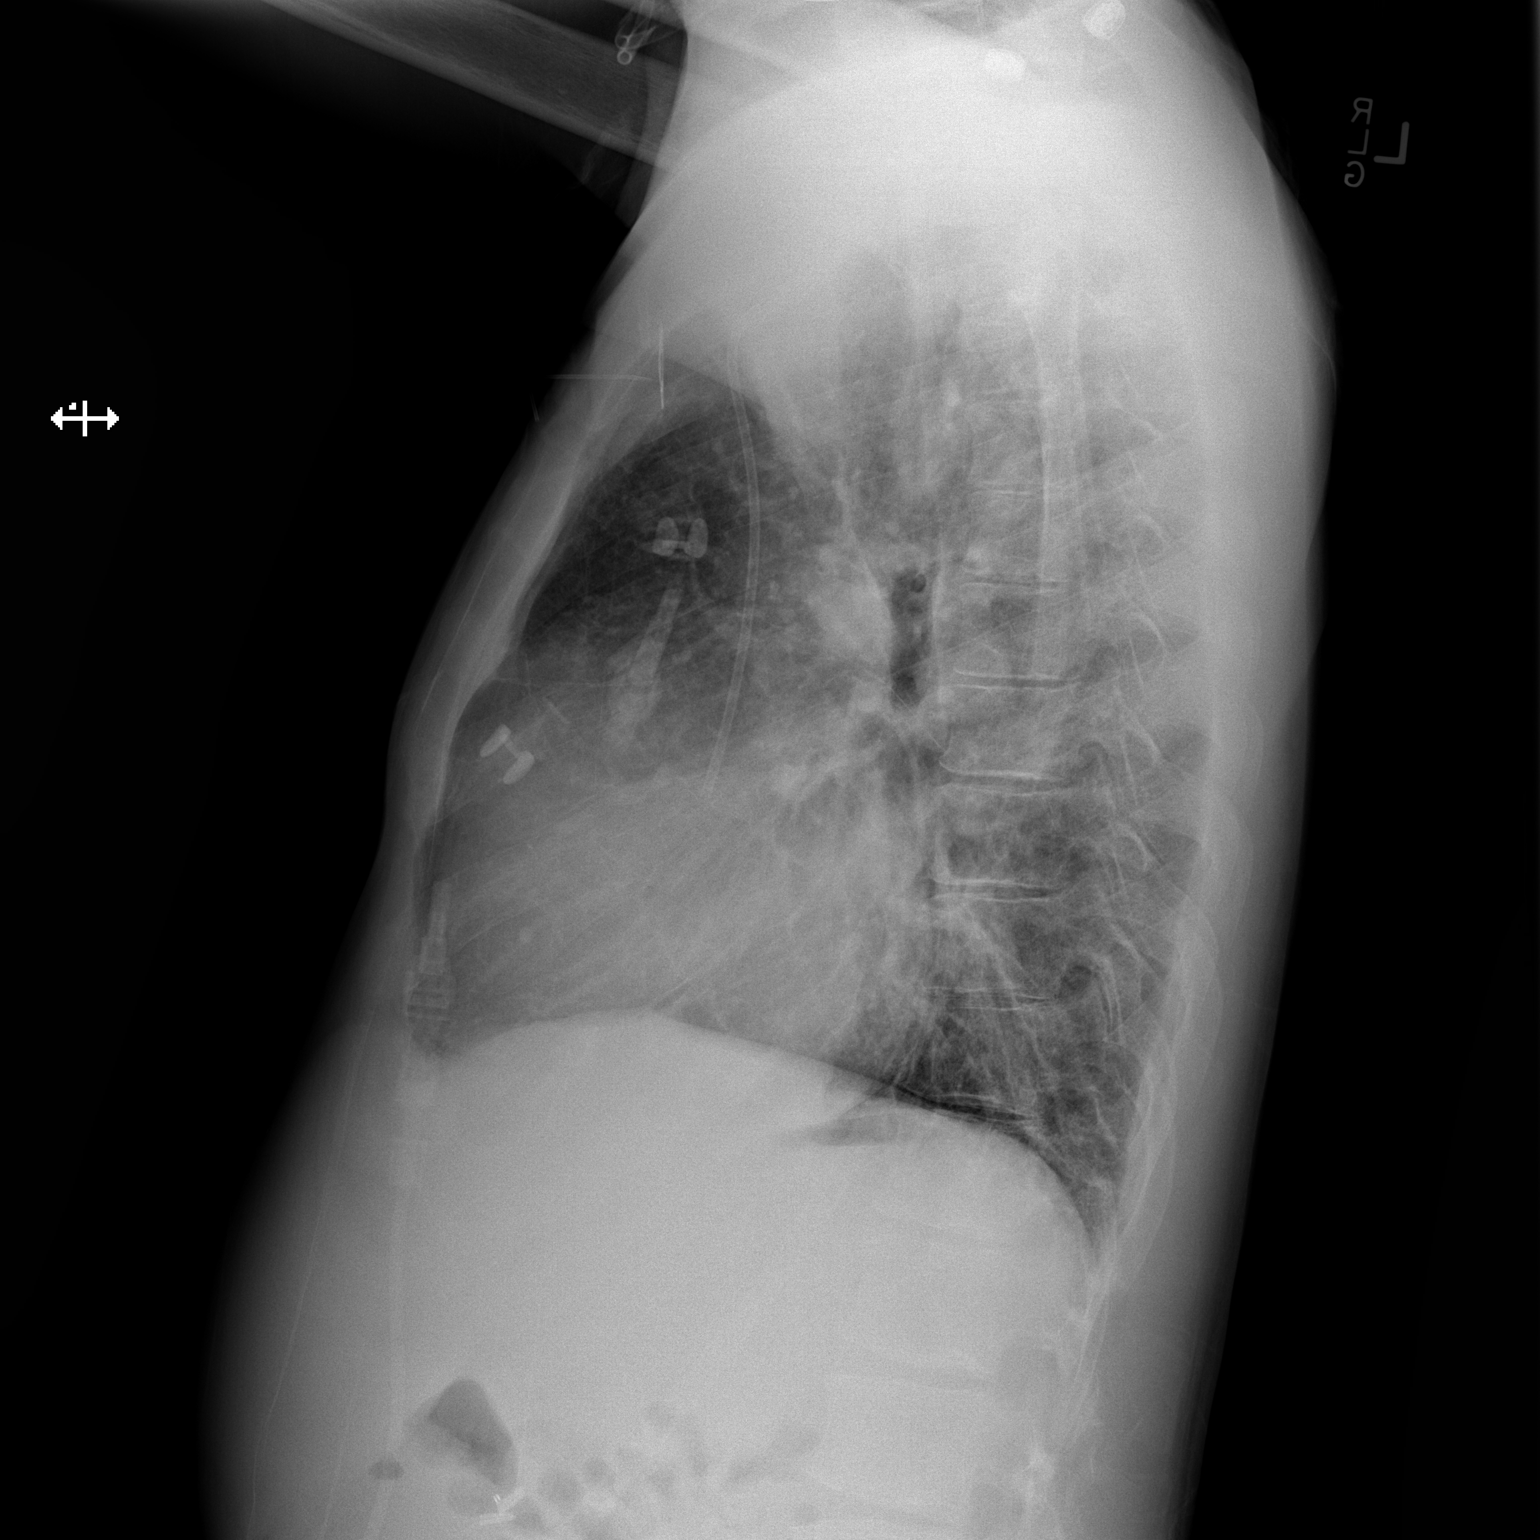

[2 of 2 positions shown; findings below may reference images not displayed]

FINDINGS: Cardiac enlargement with patchy interstitial changes in the lungs
similar to previous study. No focal consolidation or airspace
disease. No blunting of costophrenic angles. No pneumothorax. Power
port type central venous catheter with tip over the cavoatrial
junction.
IMPRESSION: Cardiac enlargement with stable patchy interstitial changes in the
lungs. No acute infiltration.

## 2017-06-12 IMAGING — CR DG CHEST 2V
2 series · 2 of 2 positions shown · non-contrast
Comparison: 07/14/2014

CLINICAL DATA: Chest pain for 1 day.  Sickle cell.

EXAM:
CHEST  2 VIEW

[w chest pa]
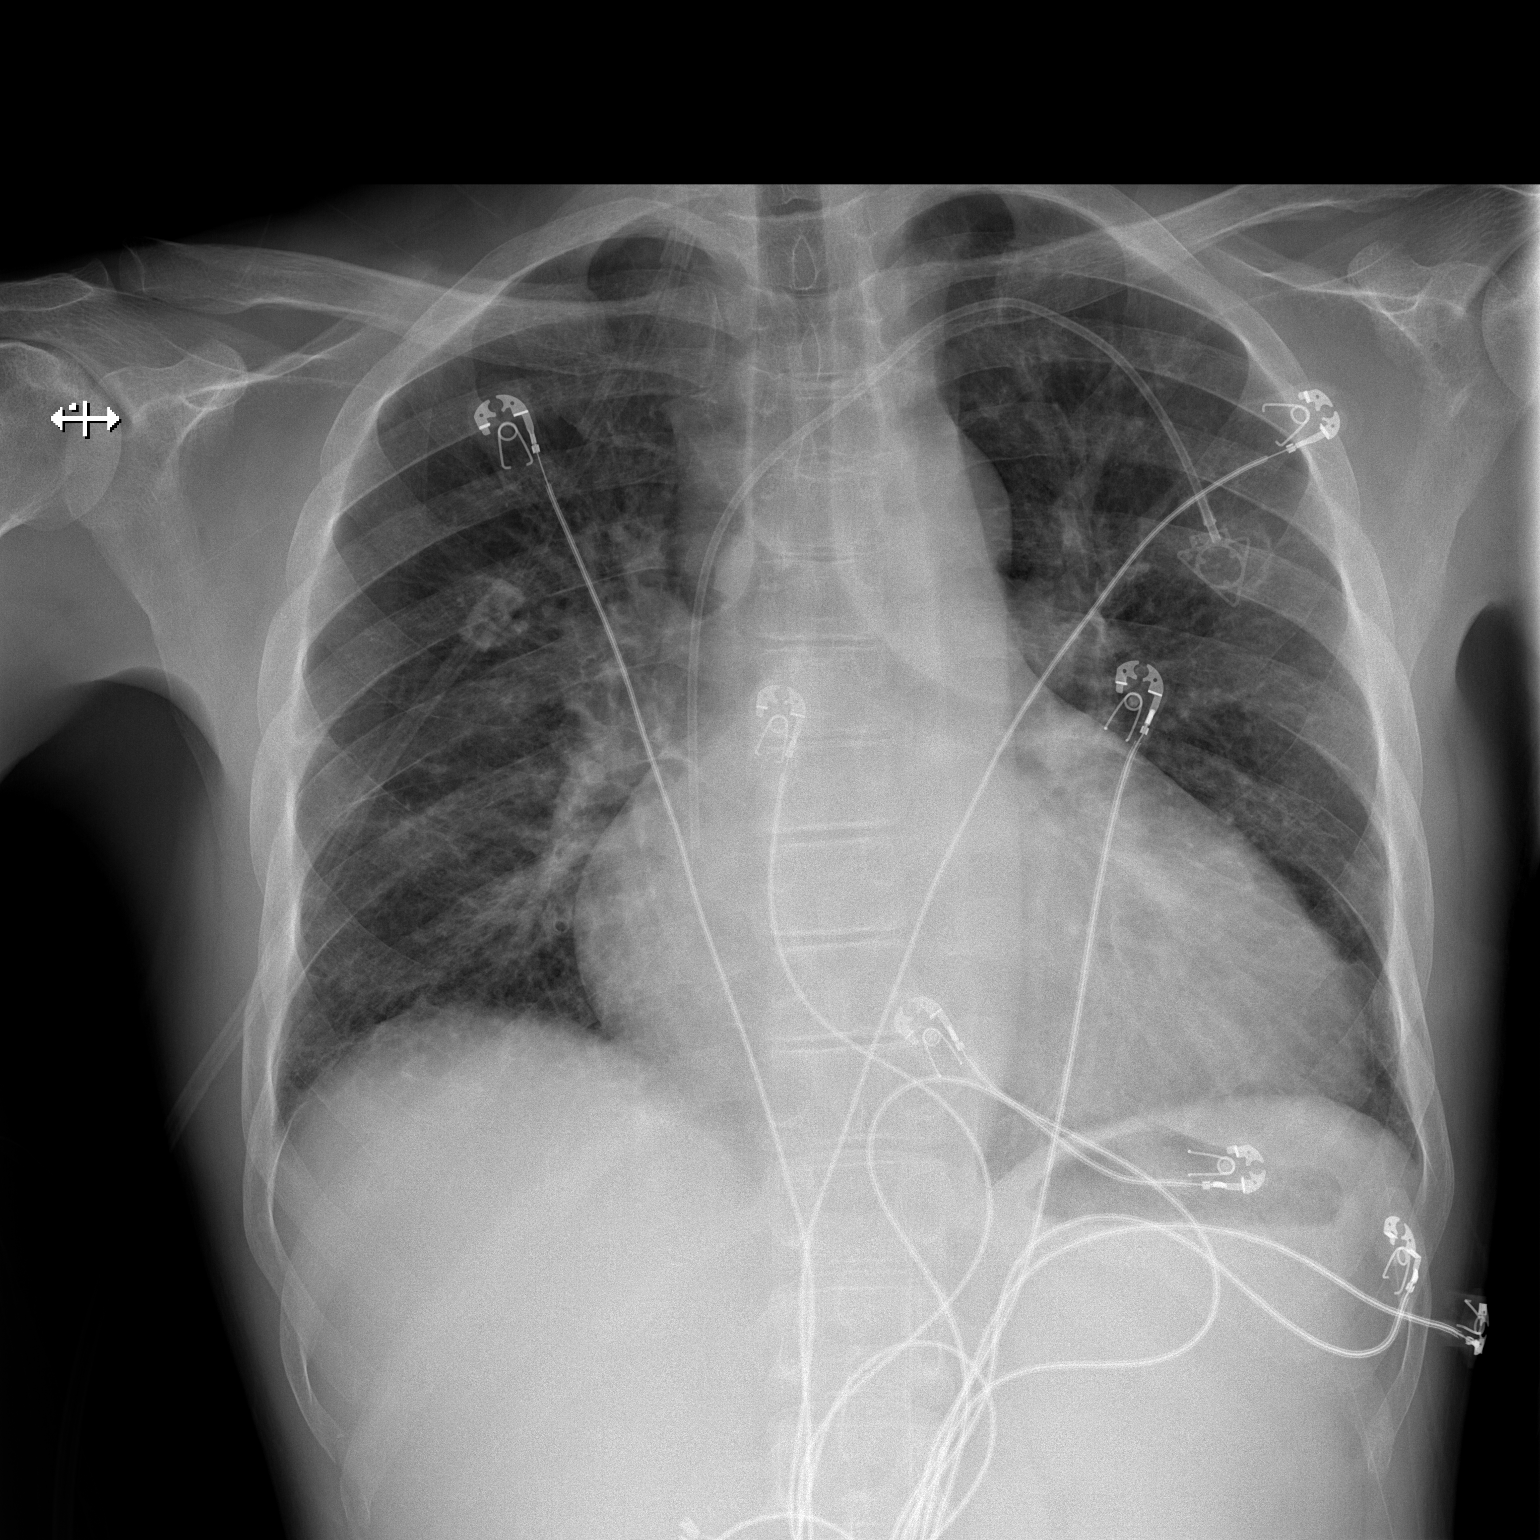

[w chest lat]
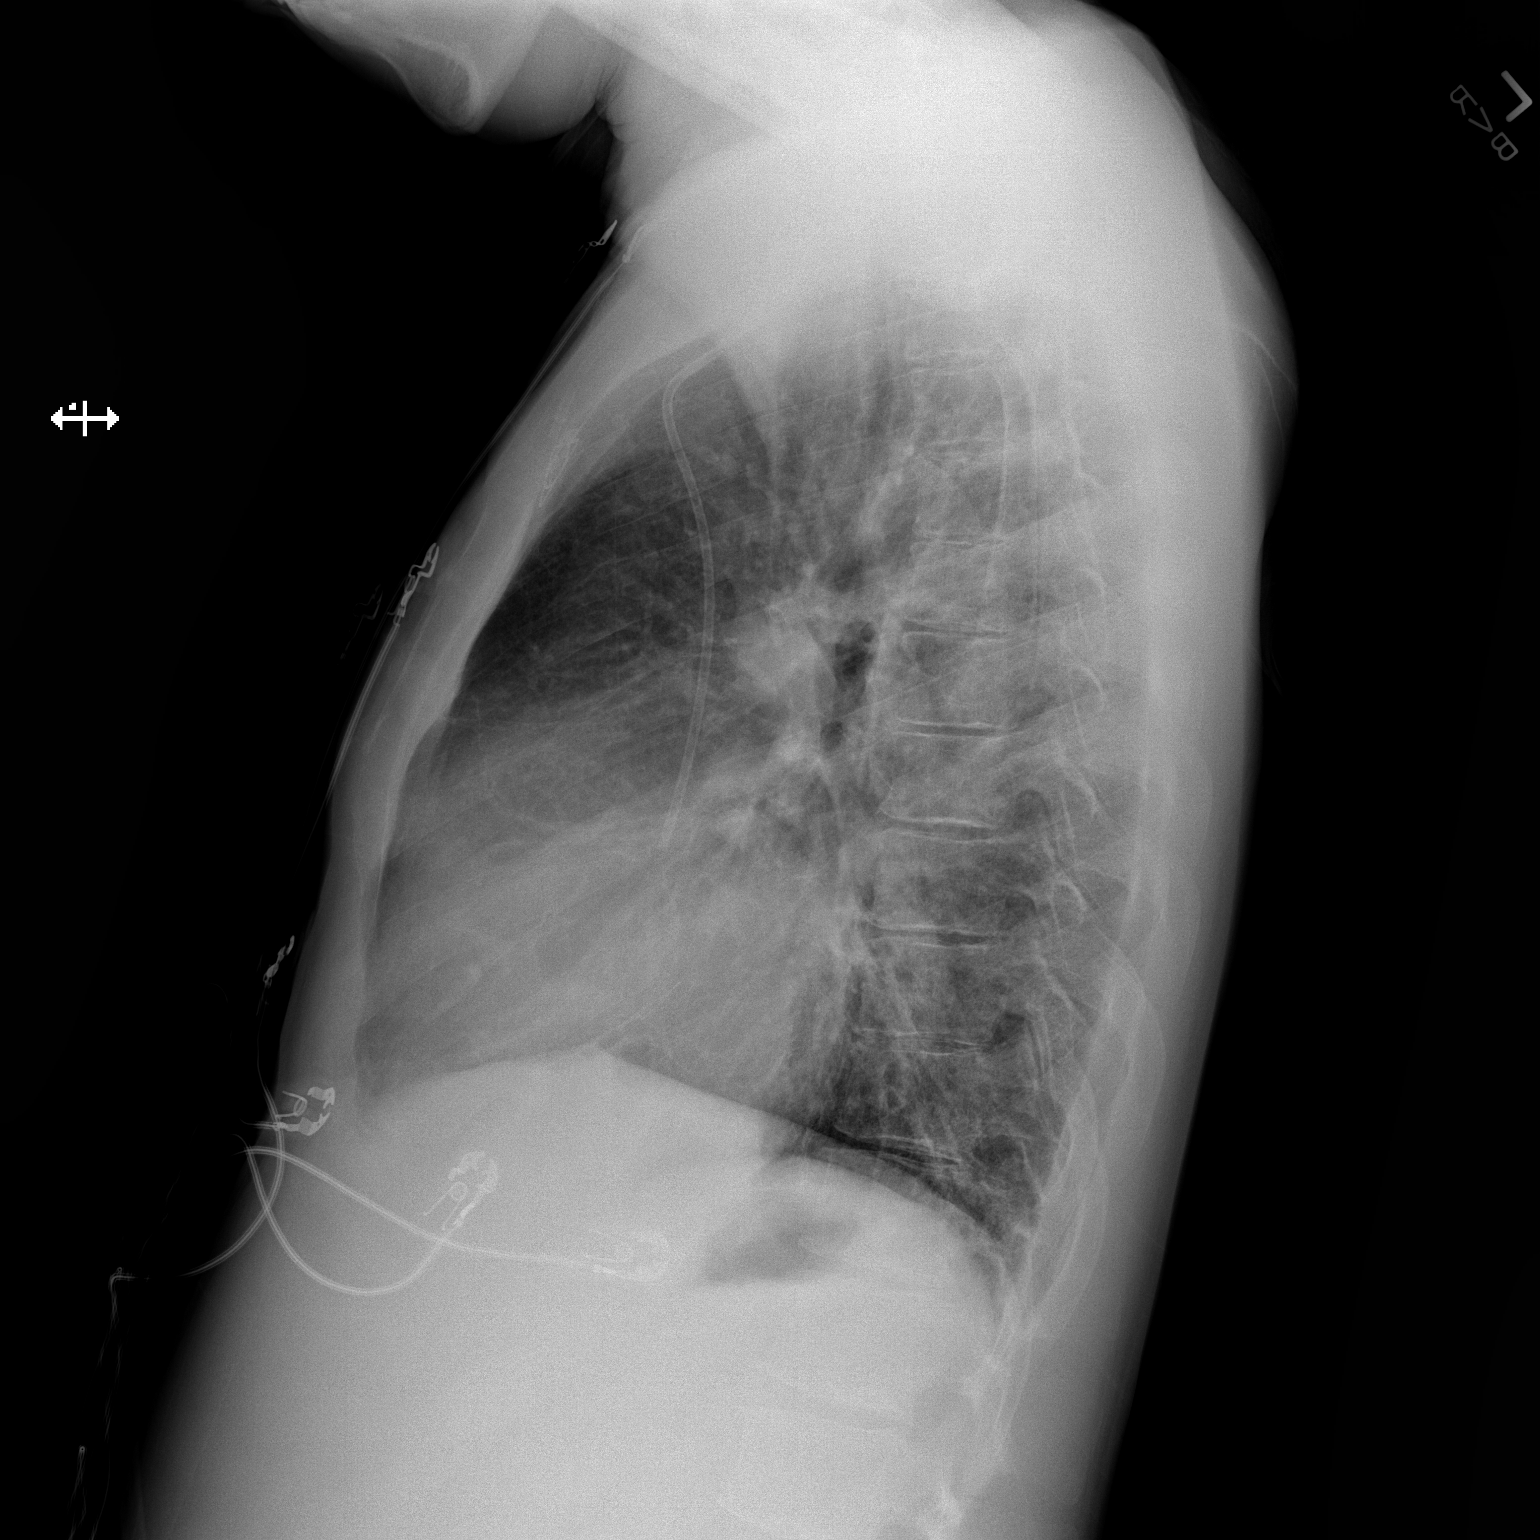

[2 of 2 positions shown; findings below may reference images not displayed]

FINDINGS: Prominent cardiomegaly. Left subclavian Port-A-Cath device with its
tip at the cavoatrial junction is stable. Interstitial changes
towards lung bases are stable. No pneumothorax. No pleural effusion.
No consolidation
IMPRESSION: Cardiomegaly and chronic basilar interstitial changes.

## 2017-06-12 IMAGING — CT CT ANGIO CHEST
2 of 6 series · 18 of 36 positions shown · IV contrast (omnipaque)
Comparison: 07/20/2014 chest radiograph, chest CT 03/19/2014

CLINICAL DATA: Chest pain, sickle cell crisis

EXAM:
CT ANGIOGRAPHY CHEST WITH CONTRAST
TECHNIQUE: Multidetector CT imaging of the chest was performed using the
standard protocol during bolus administration of intravenous
contrast. Multiplanar CT image reconstructions and MIPs were
obtained to evaluate the vascular anatomy.
CONTRAST:  100mL OMNIPAQUE IOHEXOL 350 MG/ML SOLN

[Series 6: thins for pacs · axial · 0.73mm/px · z∈[-225,+34]mm · 17 of 289 slices shown]
[im 15/289  lung]
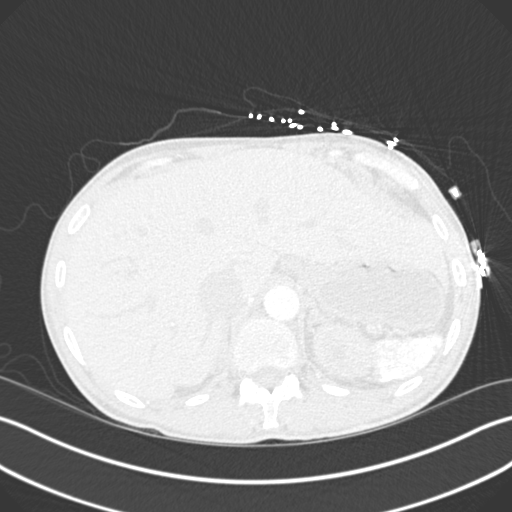
[im 29/289  mediastinal]
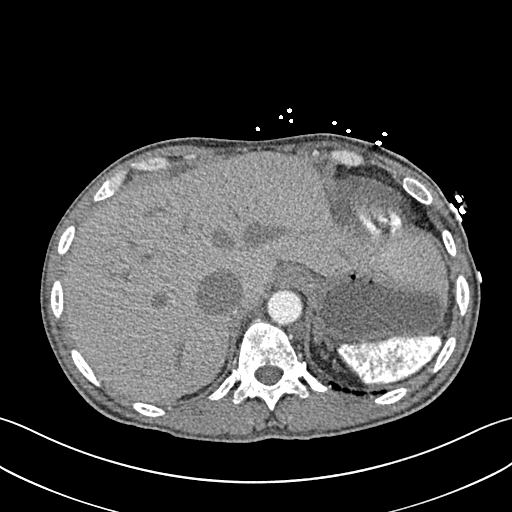
[im 44/289  lung]
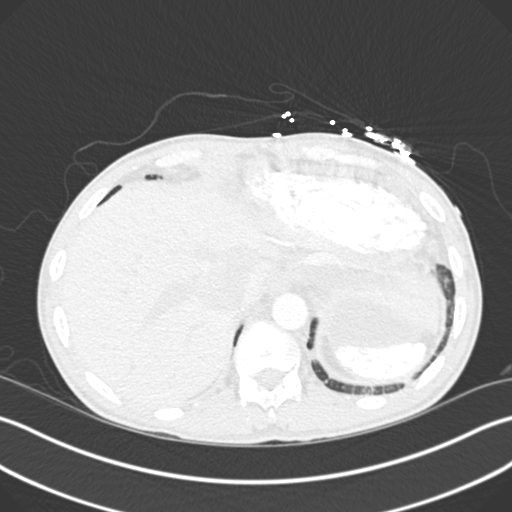
[im 58/289  mediastinal]
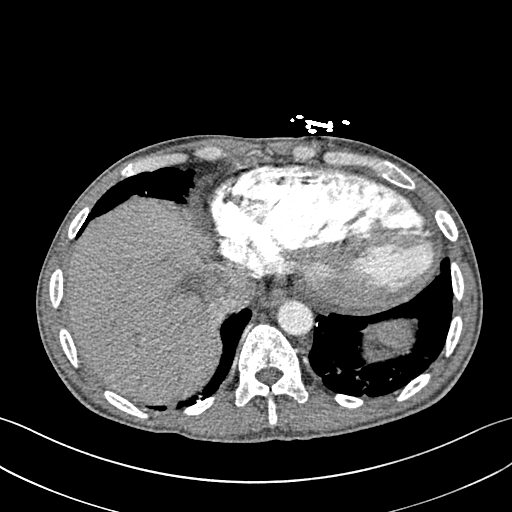
[im 87/289  lung]
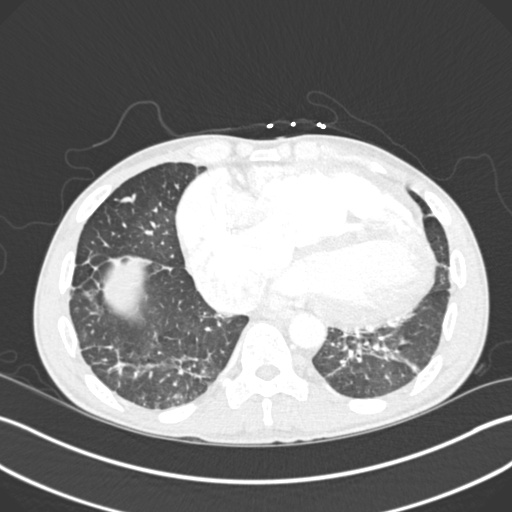
[im 101/289  mediastinal]
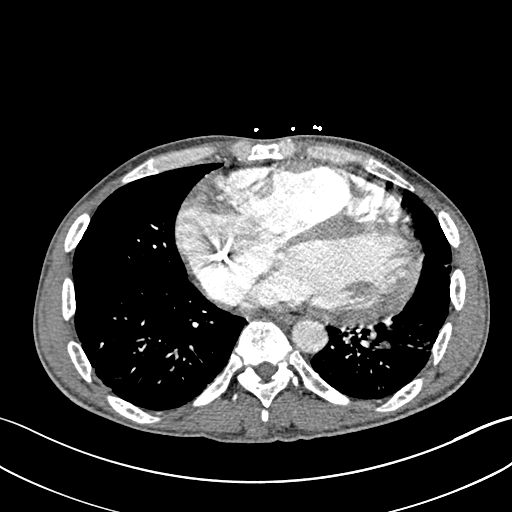
[im 116/289  lung]
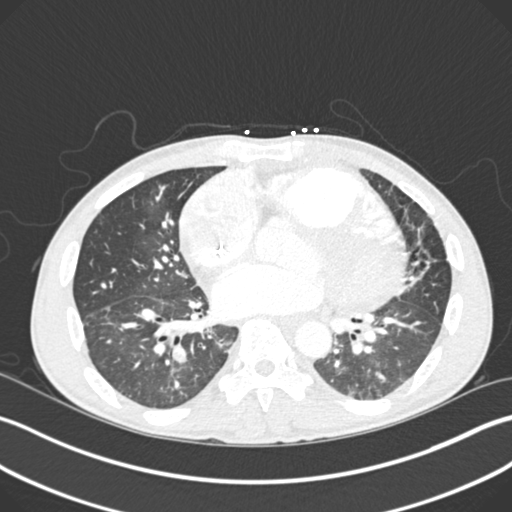
[im 130/289  mediastinal]
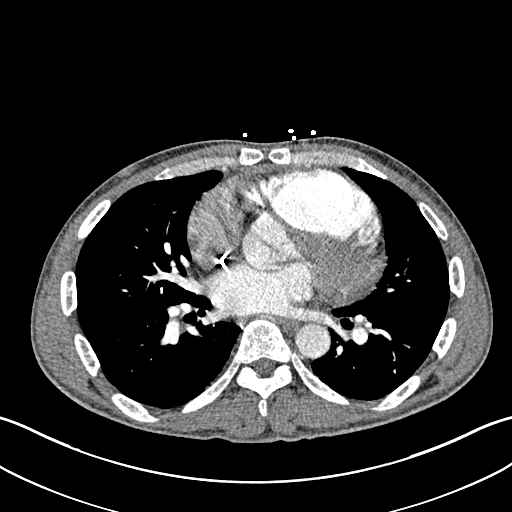
[im 145/289  lung]
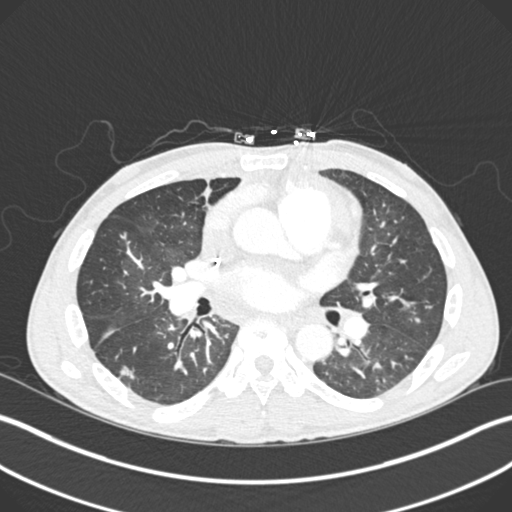
[im 159/289  mediastinal]
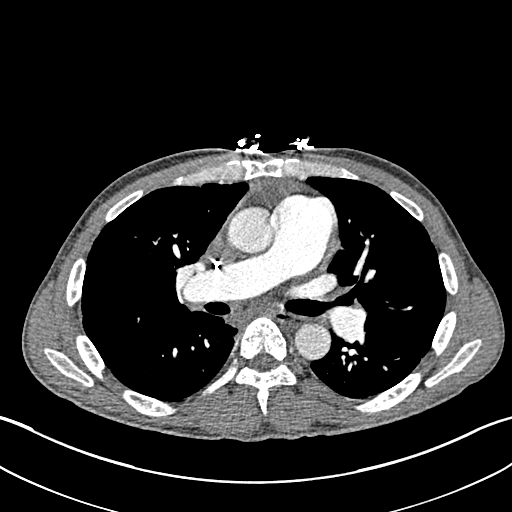
[im 173/289  lung]
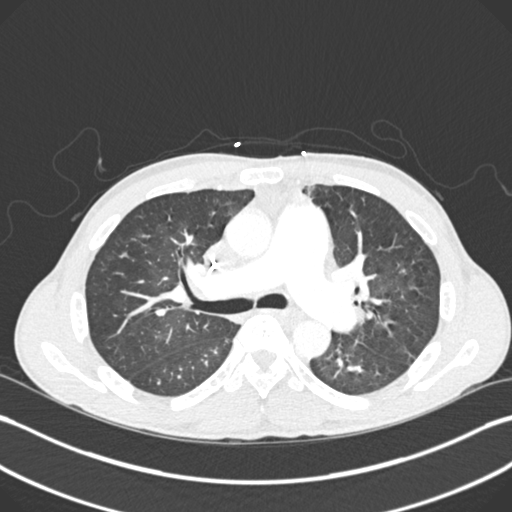
[im 188/289  mediastinal]
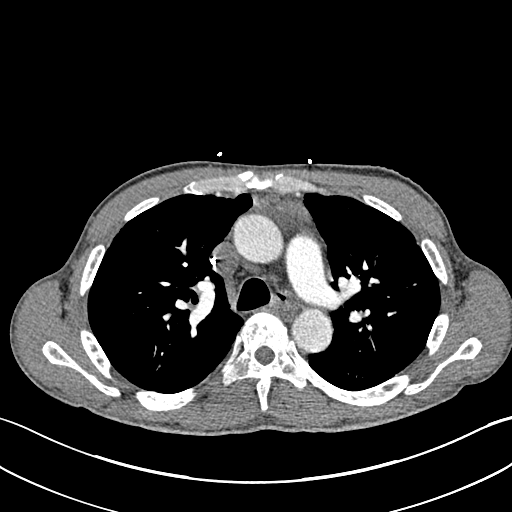
[im 202/289  lung]
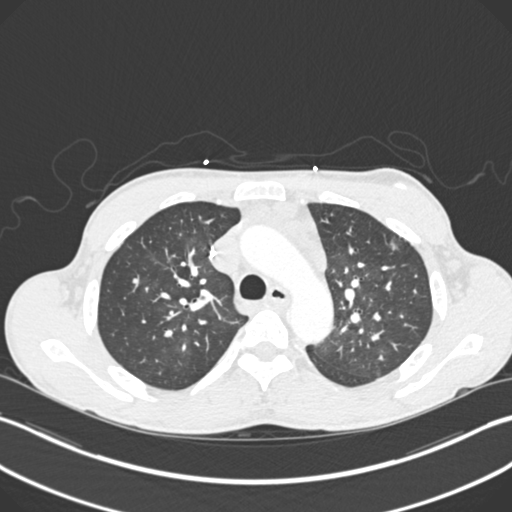
[im 231/289  mediastinal]
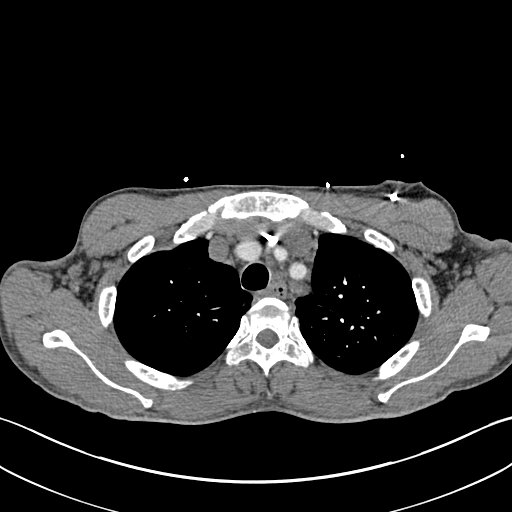
[im 245/289  lung]
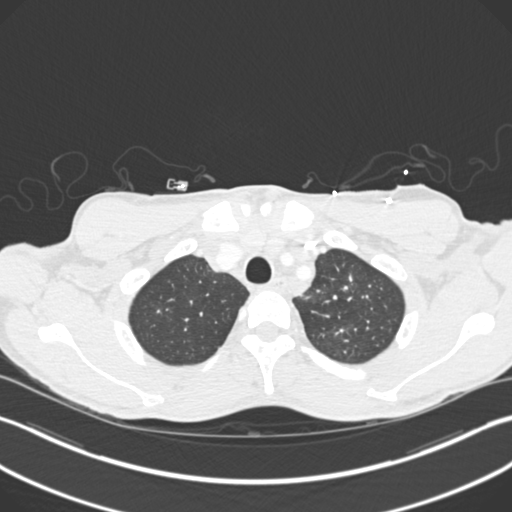
[im 260/289  mediastinal]
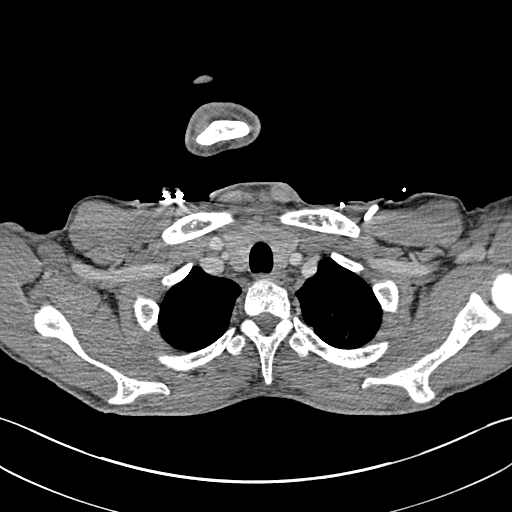
[im 274/289  lung]
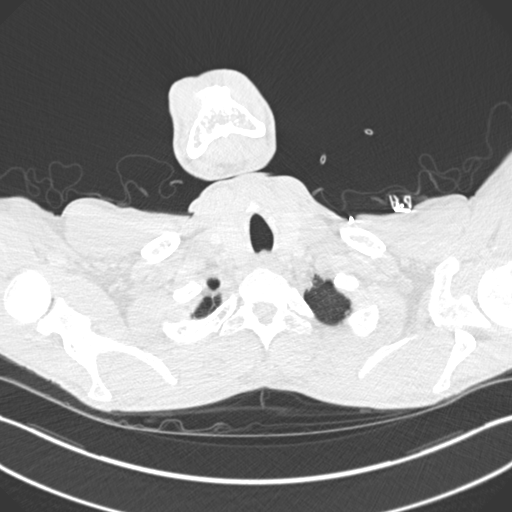

[Series 8: coronal mpr · coronal · 0.60mm/px · 1 of 132 slices shown]
[im 66/132  mediastinal]
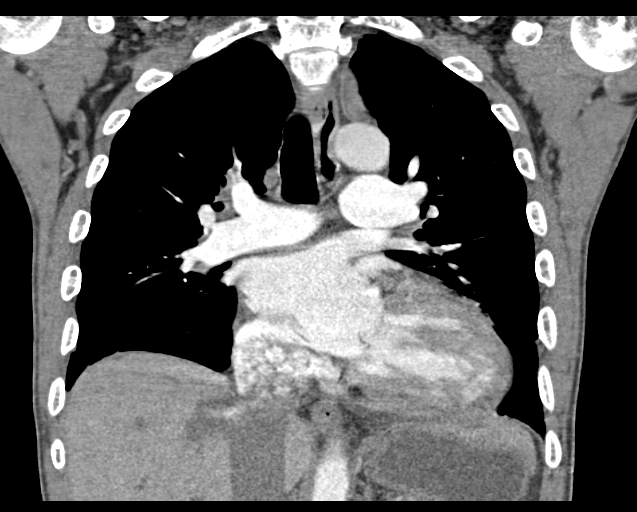

[18 of 36 positions shown; findings below may reference images not displayed]

FINDINGS: Mediastinum/Nodes: Left-sided Port-A-Cath in place with tip in the
right atrium. Cardiomegaly noted. Right atrial prominence. No
pericardial effusion. AP window lymphadenopathy persists, largest
1.0 cm image 32. Representative pretracheal node measures 0.7 cm
image 37.

Pulmonary arterial dilation is reidentified measuring 4.0 cm maximal
transverse diameter image 121 series 6. The examination is adequate
for evaluation for acute pulmonary embolism up to and including the
3rd order pulmonary arteries. Multiple bilateral lower lobe filling
defects are identified compatible with pulmonary embolism, for
example image 178 series 6. Allowing for motion artifact, there is
no evidence for right heart strain as by calculated RV: LV ratio of
0.8.

Lungs/Pleura: Areas of mosaic type per fusion are identified
bilaterally, likely related to pulmonary emboli. Curvilinear
bilateral lower lobe scarring or atelectasis is noted.

Upper abdomen: Normal

Musculoskeletal: Inhomogeneous trabecular pattern of the vertebral
bodies is compatible with sickle cell deformity. No acute osseous
abnormality.

Review of the MIP images confirms the above findings.
IMPRESSION: Small to moderate overall clot burden with bilateral predominantly
lower lobe pulmonary emboli. No CT evidence for right heart strain.

Cardiomegaly.

Mosaic type perfusion pattern within the lung parenchyma compatible
with pulmonary emboli.

Critical Value/emergent results were called by telephone at the time
of interpretation on 07/20/2014 at [DATE] to Dr. FORTNITE DU ,
who verbally acknowledged these results.

## 2017-06-26 IMAGING — CR DG CHEST 2V
2 series · 2 of 2 positions shown · non-contrast
Comparison: 07/20/2014

CLINICAL DATA: Sickle cell pain crisis.

EXAM:
CHEST  2 VIEW

[w chest pa]
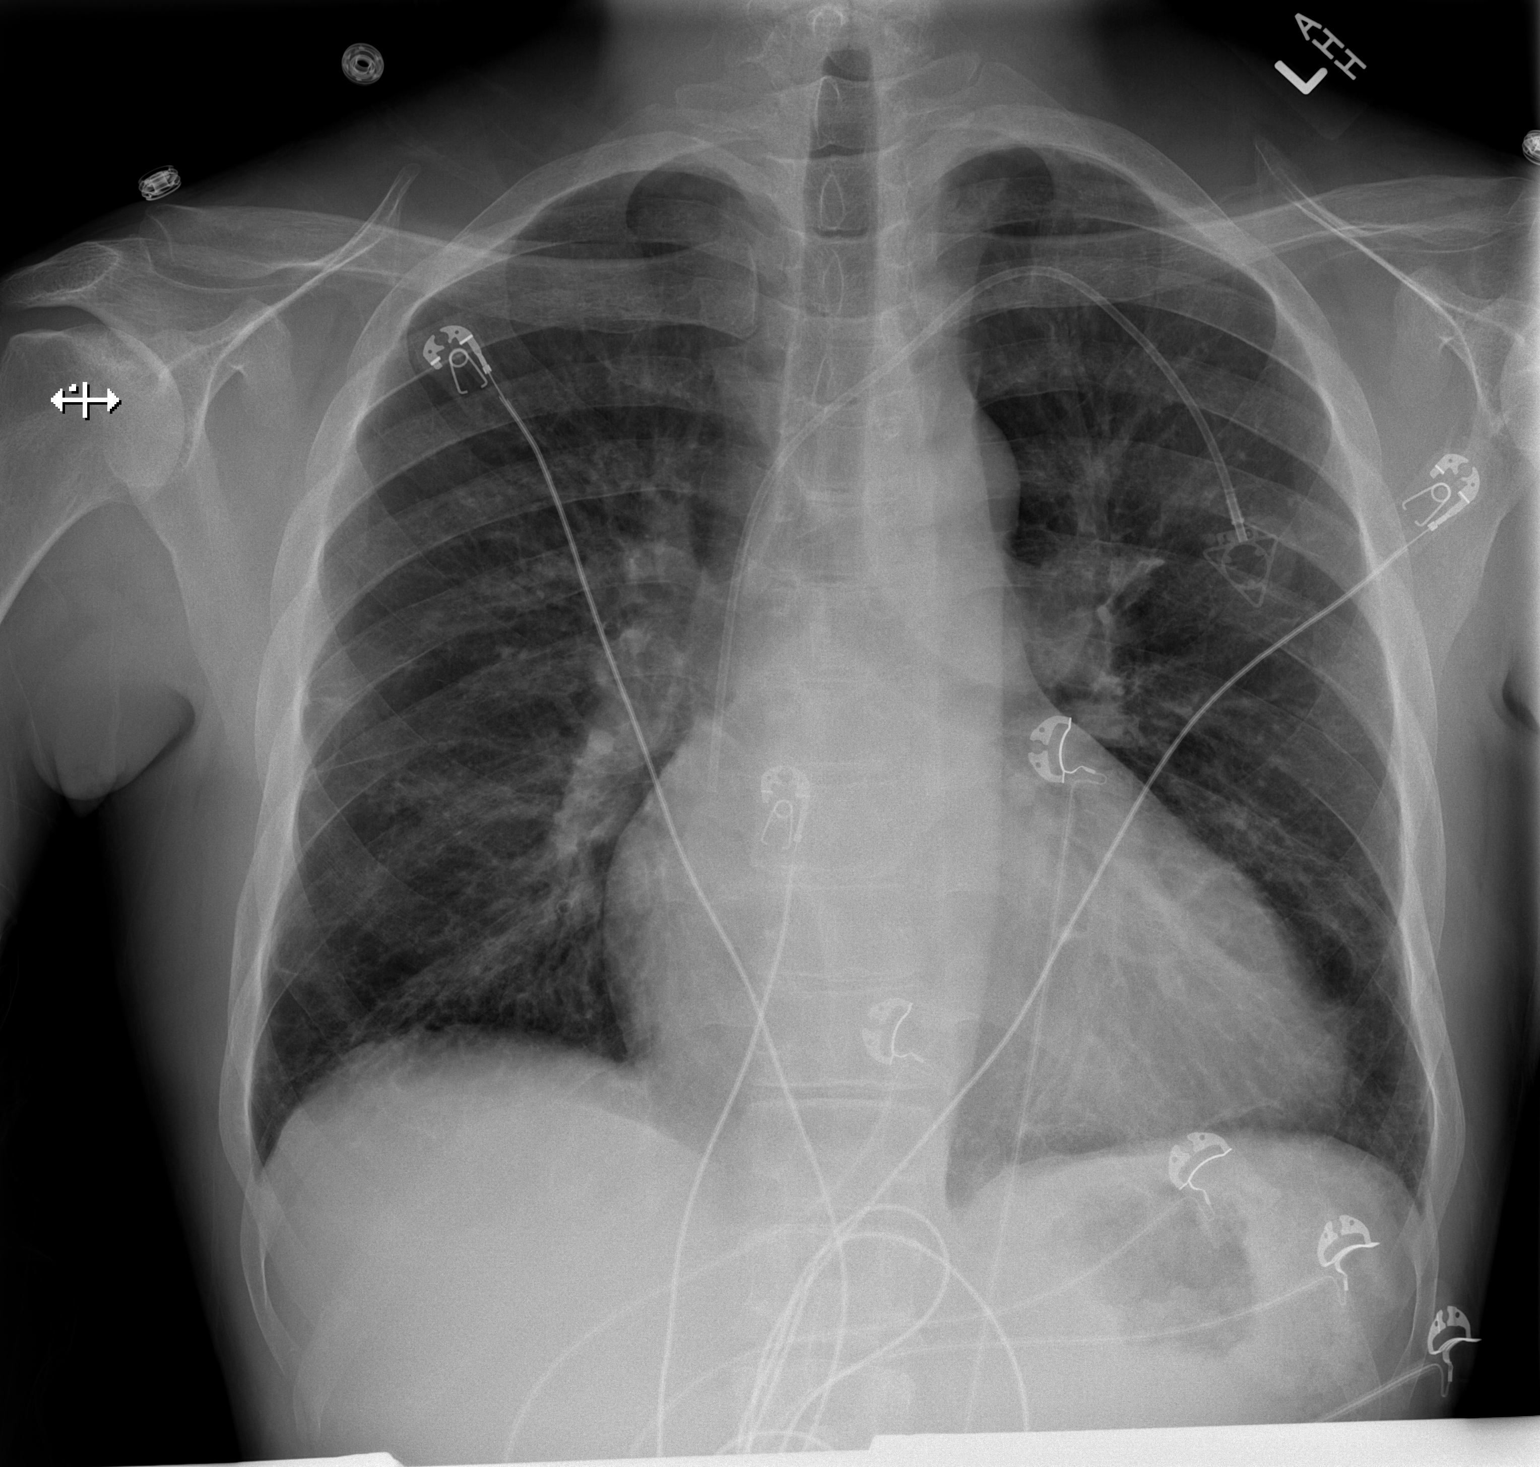

[w chest lat]
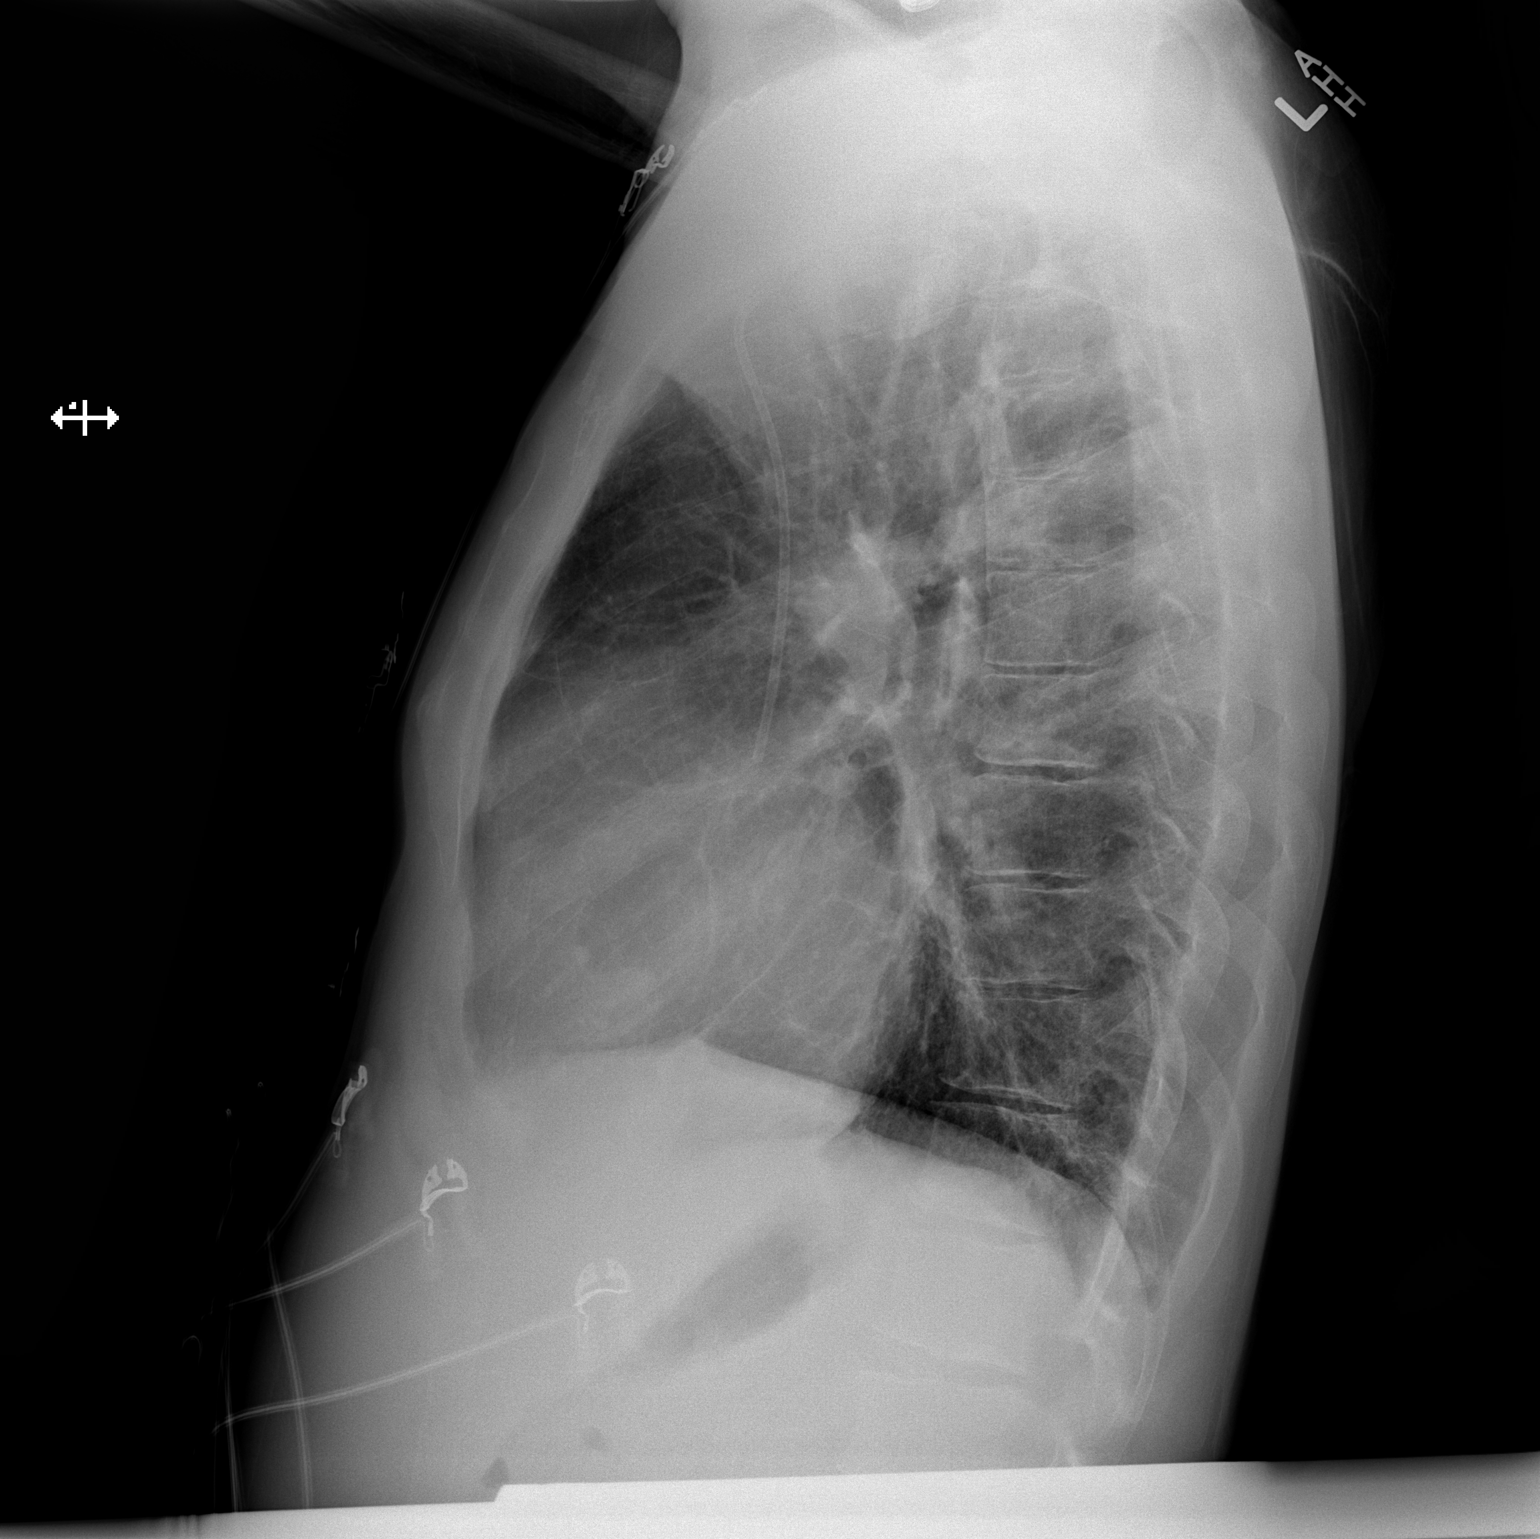

[2 of 2 positions shown; findings below may reference images not displayed]

FINDINGS: Stable appearance of the left Port-A-Cath. Catheter tip in the lower
SVC region. Prominent lung markings in the mid and lower lungs have
not significantly changed. There is no significant airspace disease
or pulmonary edema. Heart size remains enlarged. No evidence for
pleural effusions.
IMPRESSION: Stable mild cardiomegaly without acute lung findings.

## 2017-06-28 IMAGING — CR DG CHEST 1V PORT
1 series · 1 of 1 positions shown · non-contrast
Comparison: 08/03/2014

CLINICAL DATA: Hypoxia.  Sickle cell pain crisis

EXAM:
PORTABLE CHEST - 1 VIEW

[AP]
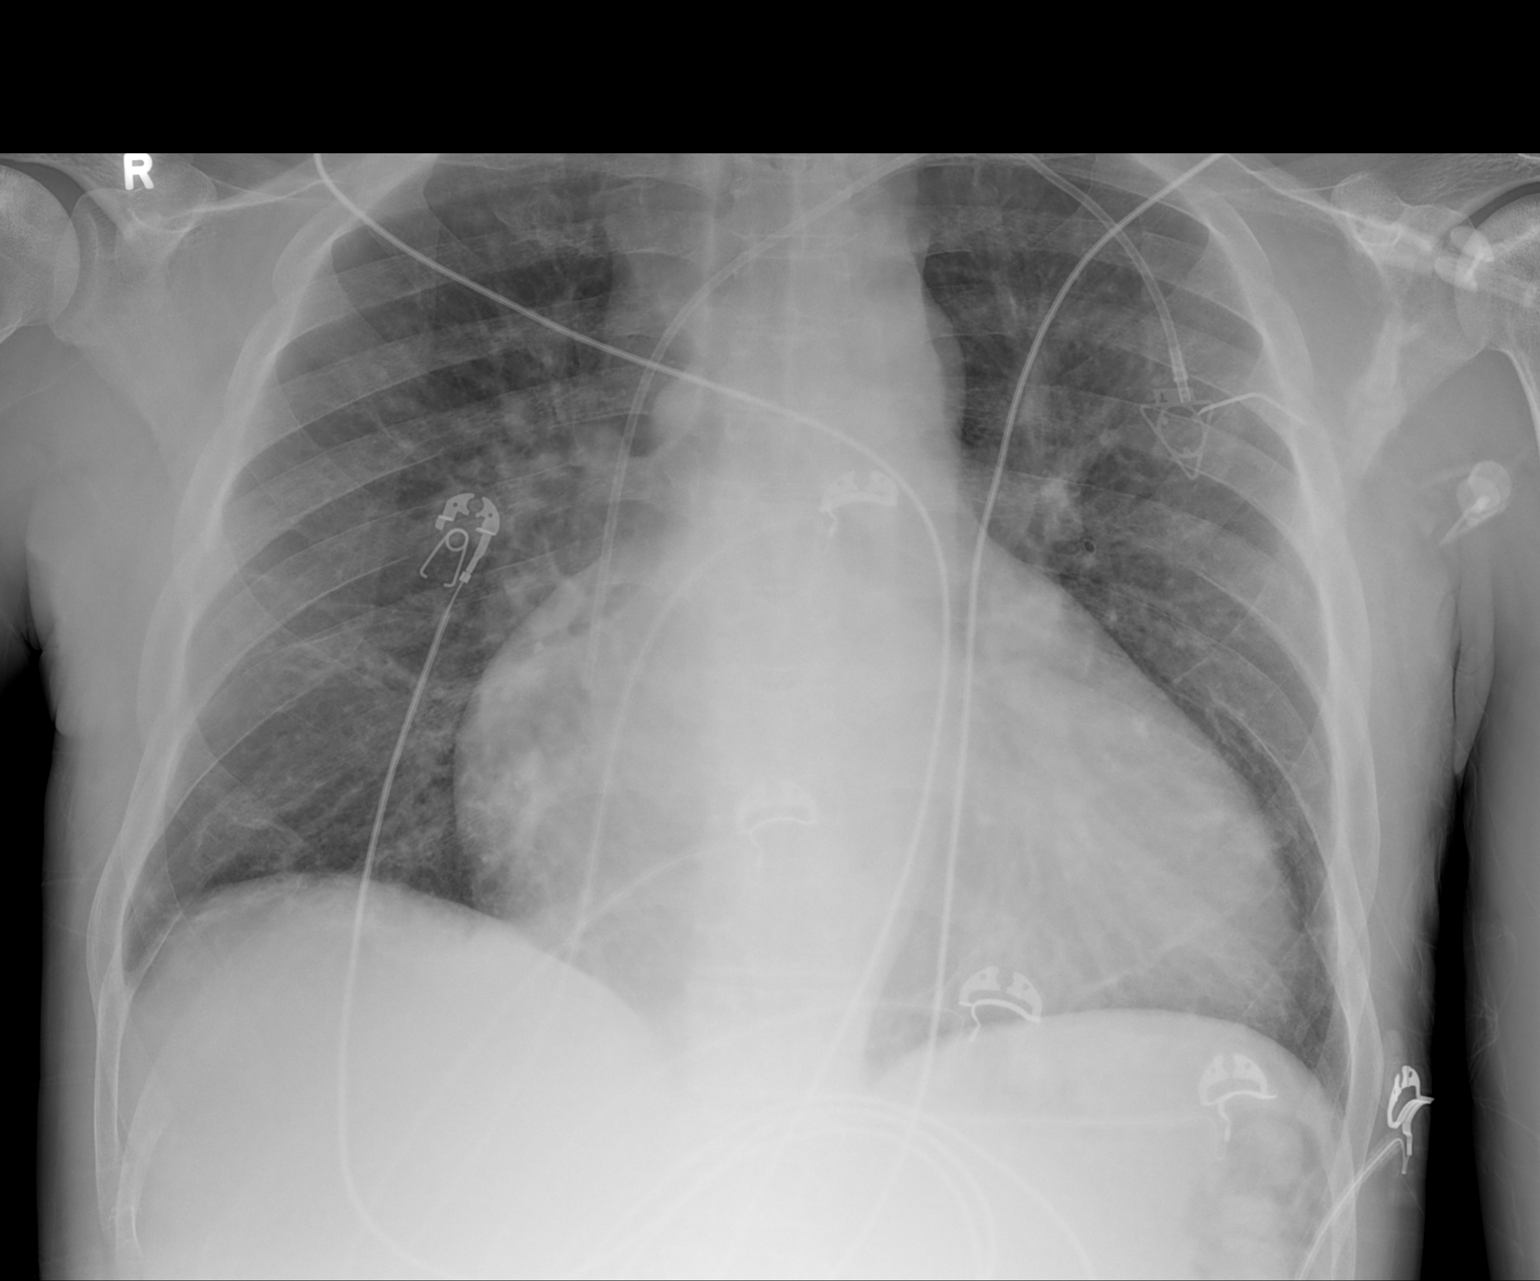

[1 of 1 positions shown; findings below may reference images not displayed]

FINDINGS: Chronic cardiomegaly. Aortic and hilar contours are stable and
negative. Stable left subclavian porta catheter, tip at the upper
right atrium.

Chronic reticulation of lung markings consistent with scarring.
There is no edema, consolidation, effusion, or pneumothorax.

No acute osseous findings.
IMPRESSION: Stable exam.  No evidence of acute cardiopulmonary disease.

## 2017-06-29 IMAGING — US IR FLUORO GUIDE CV LINE*R*
1 series · 1 of 1 positions shown · non-contrast
Comparison: none

CLINICAL DATA: Sickle cell

[Series 1: ir fluoro/shunt/fist · 1 of 1 slices shown]
[im 1/1]
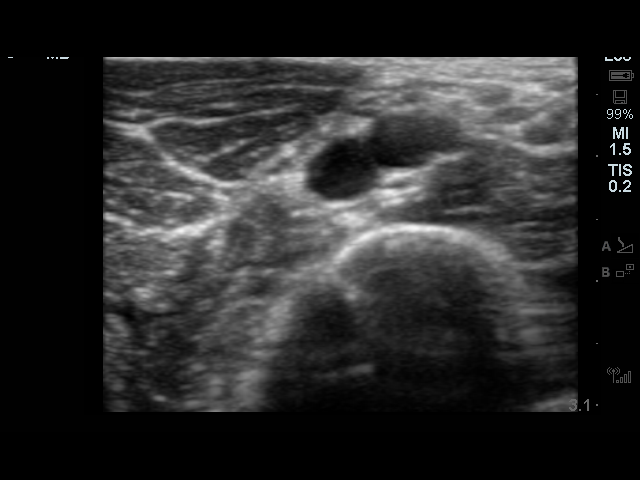

[1 of 1 positions shown; findings below may reference images not displayed]

EXAM:
RIGHT UPPER EXTREMITY PICC LINE PLACEMENT WITH ULTRASOUND AND
FLUOROSCOPIC GUIDANCE

FLUOROSCOPY TIME:  12 seconds

PROCEDURE:
The patient was advised of the possible risks and complications and
agreed to undergo the procedure. The patient was then brought to the
angiographic suite for the procedure.

The right arm was prepped with chlorhexidine, draped in the usual
sterile fashion using maximum barrier technique (cap and mask,
sterile gown, sterile gloves, large sterile sheet, hand hygiene and
cutaneous antisepsis) and infiltrated locally with 1% Lidocaine.

Ultrasound demonstrated patency of the right basilic vein, and this
was documented with an image. Under real-time ultrasound guidance,
this vein was accessed with a 21 gauge micropuncture needle and
image documentation was performed. A [DATE] wire was introduced in to
the vein. Over this, a 5 French double lumen power PICC was advanced
to the lower SVC/right atrial junction. Fluoroscopy during the
procedure and fluoro spot radiograph confirms appropriate catheter
position. The catheter was flushed and covered with a sterile
dressing.

COMPLICATIONS:
None

LENGTH:
45 cm
IMPRESSION: Successful right arm power PICC line placement with ultrasound and
fluoroscopic guidance. The catheter is ready for use.

## 2017-07-17 IMAGING — CR DG CHEST 2V
2 series · 2 of 2 positions shown · non-contrast
Comparison: 08/05/2014

CLINICAL DATA: Sickle cell crisis, shortness of breath

EXAM:
CHEST  2 VIEW

[w chest pa]
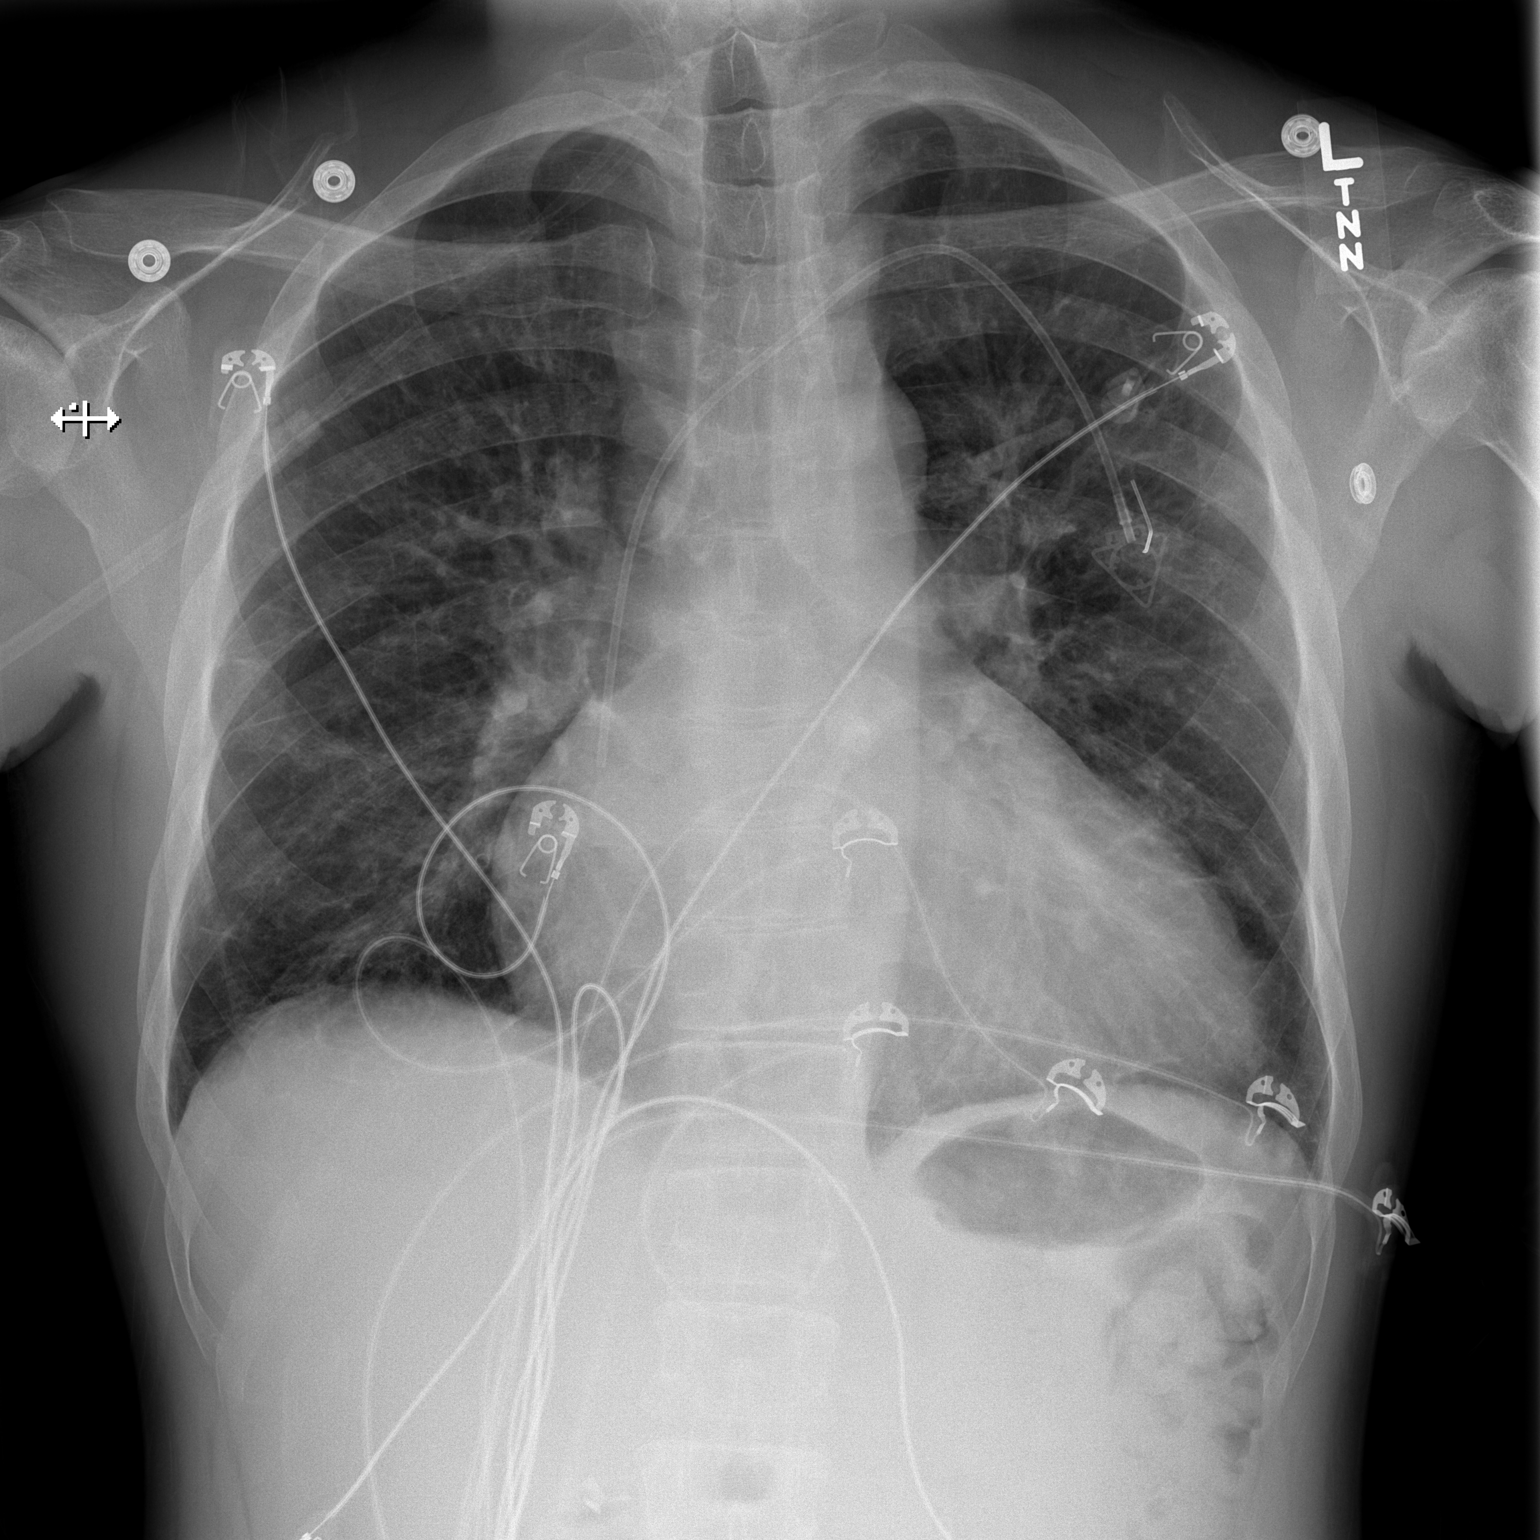

[w chest lat]
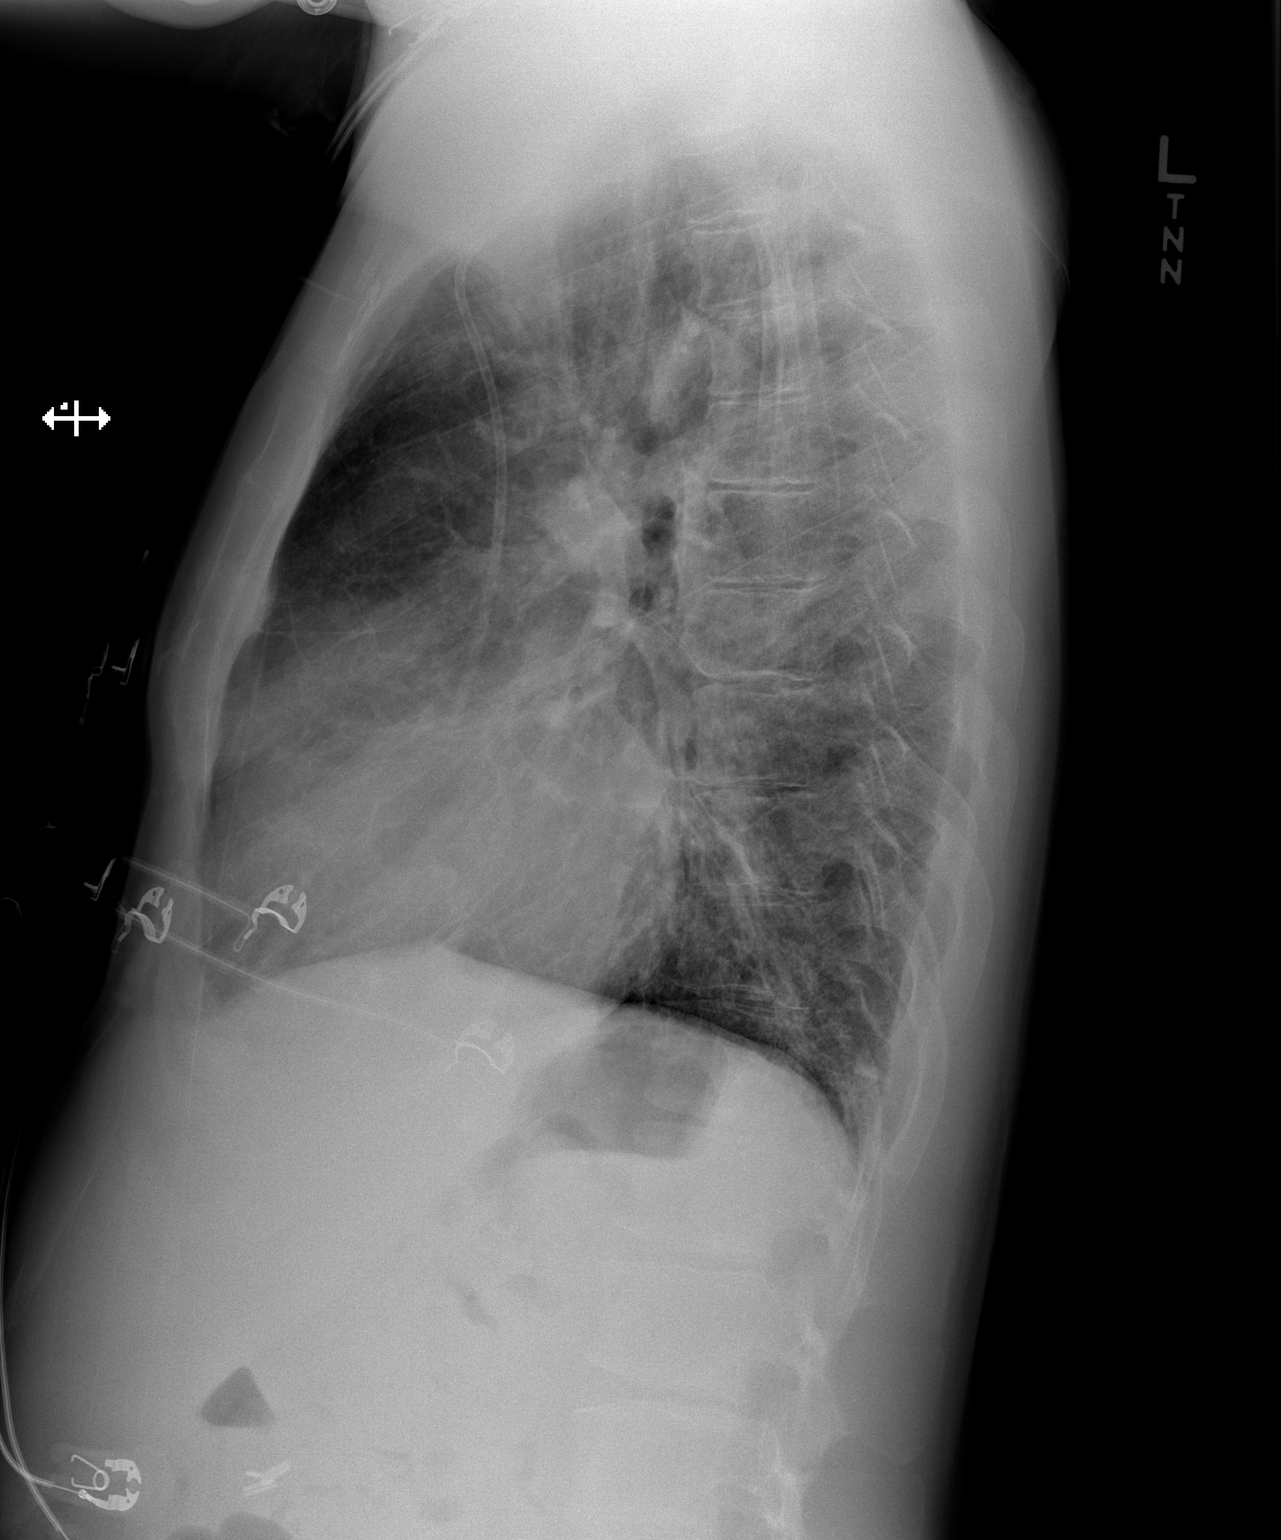

[2 of 2 positions shown; findings below may reference images not displayed]

FINDINGS: Moderate enlargement of the cardiac silhouette is reidentified.
Diffusely prominent interstitial markings are stable with areas of
linear probable superimposed scarring. No pleural effusion.
Cholecystectomy clips are noted. No acute osseous finding.
Left-sided Port-A-Cath in place with tip at the cavoatrial junction.
IMPRESSION: No focal acute finding.

## 2017-07-17 IMAGING — CR DG ANKLE COMPLETE 3+V*L*
3 series · 3 of 3 positions shown · non-contrast
Comparison: 12/21/2006

CLINICAL DATA: Sickle cell crisis with left lower leg and ankle
pain and swelling.

EXAM:
LEFT ANKLE COMPLETE - 3+ VIEW

[x ankle ap left]
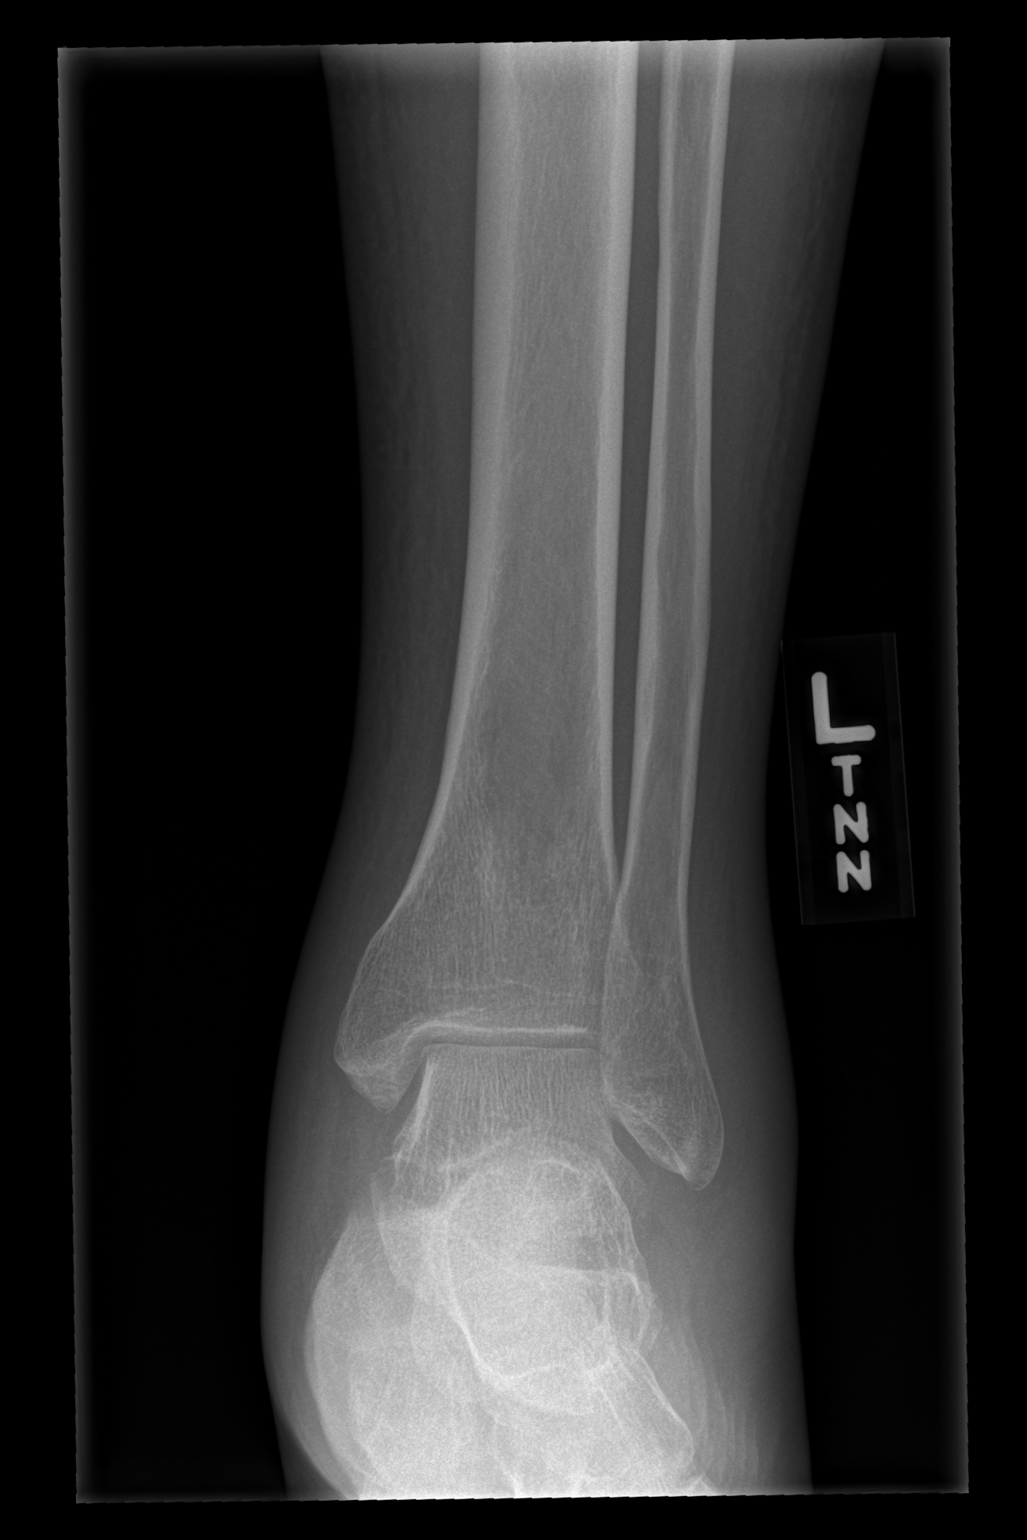

[x ankle obl left]
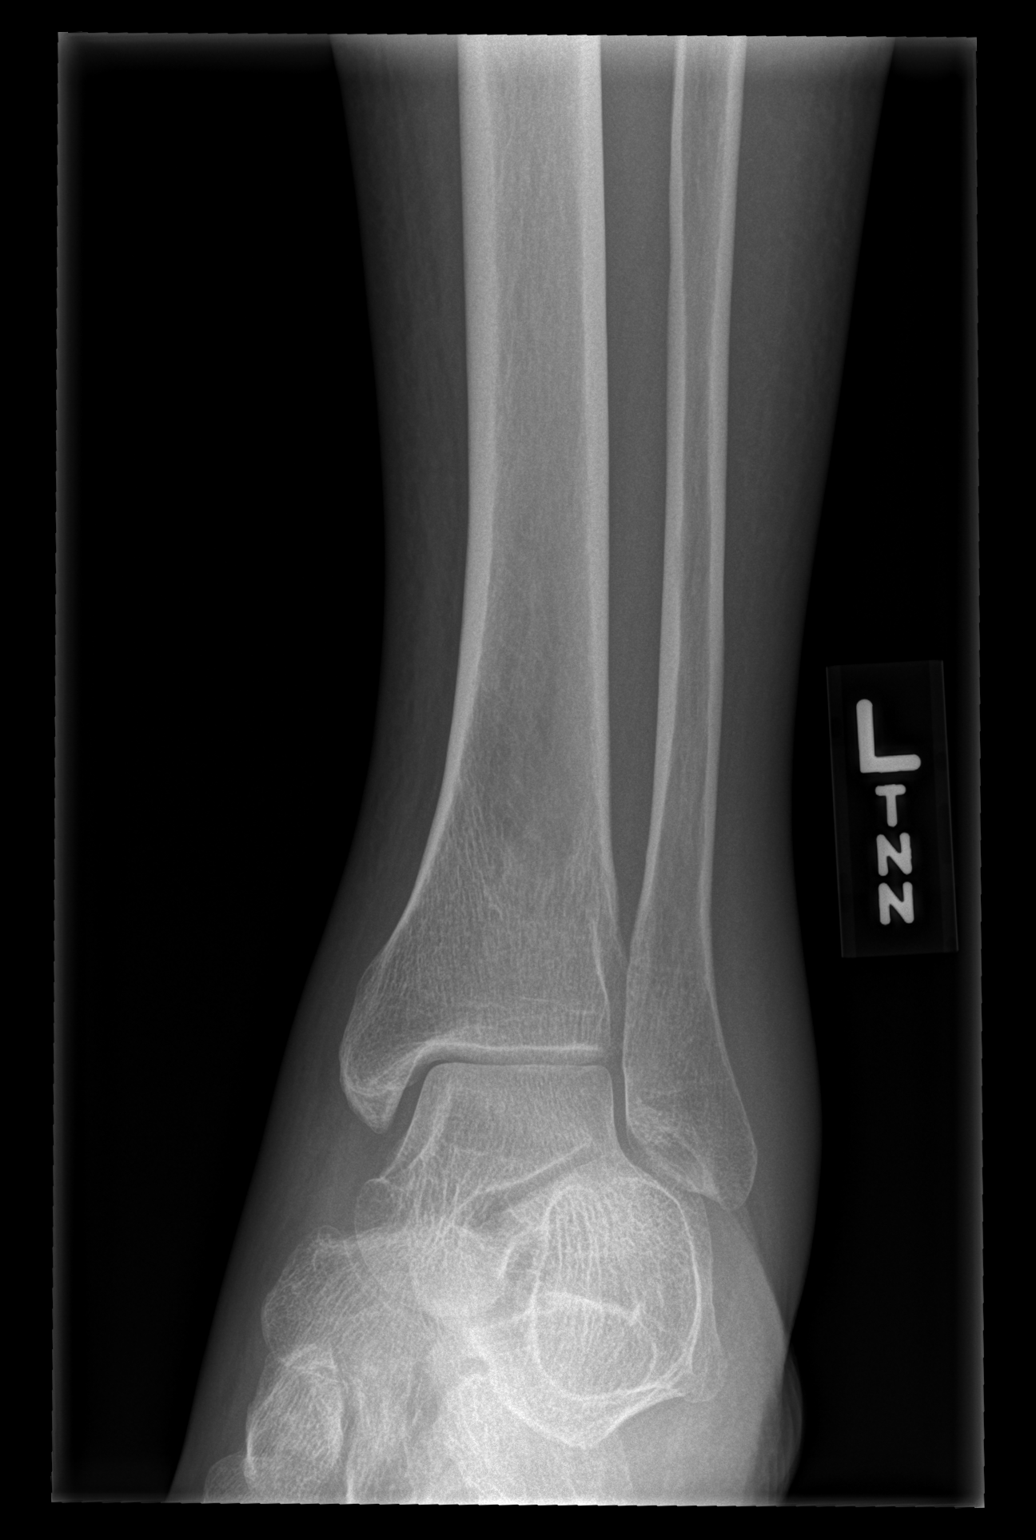

[x ankle lat left]
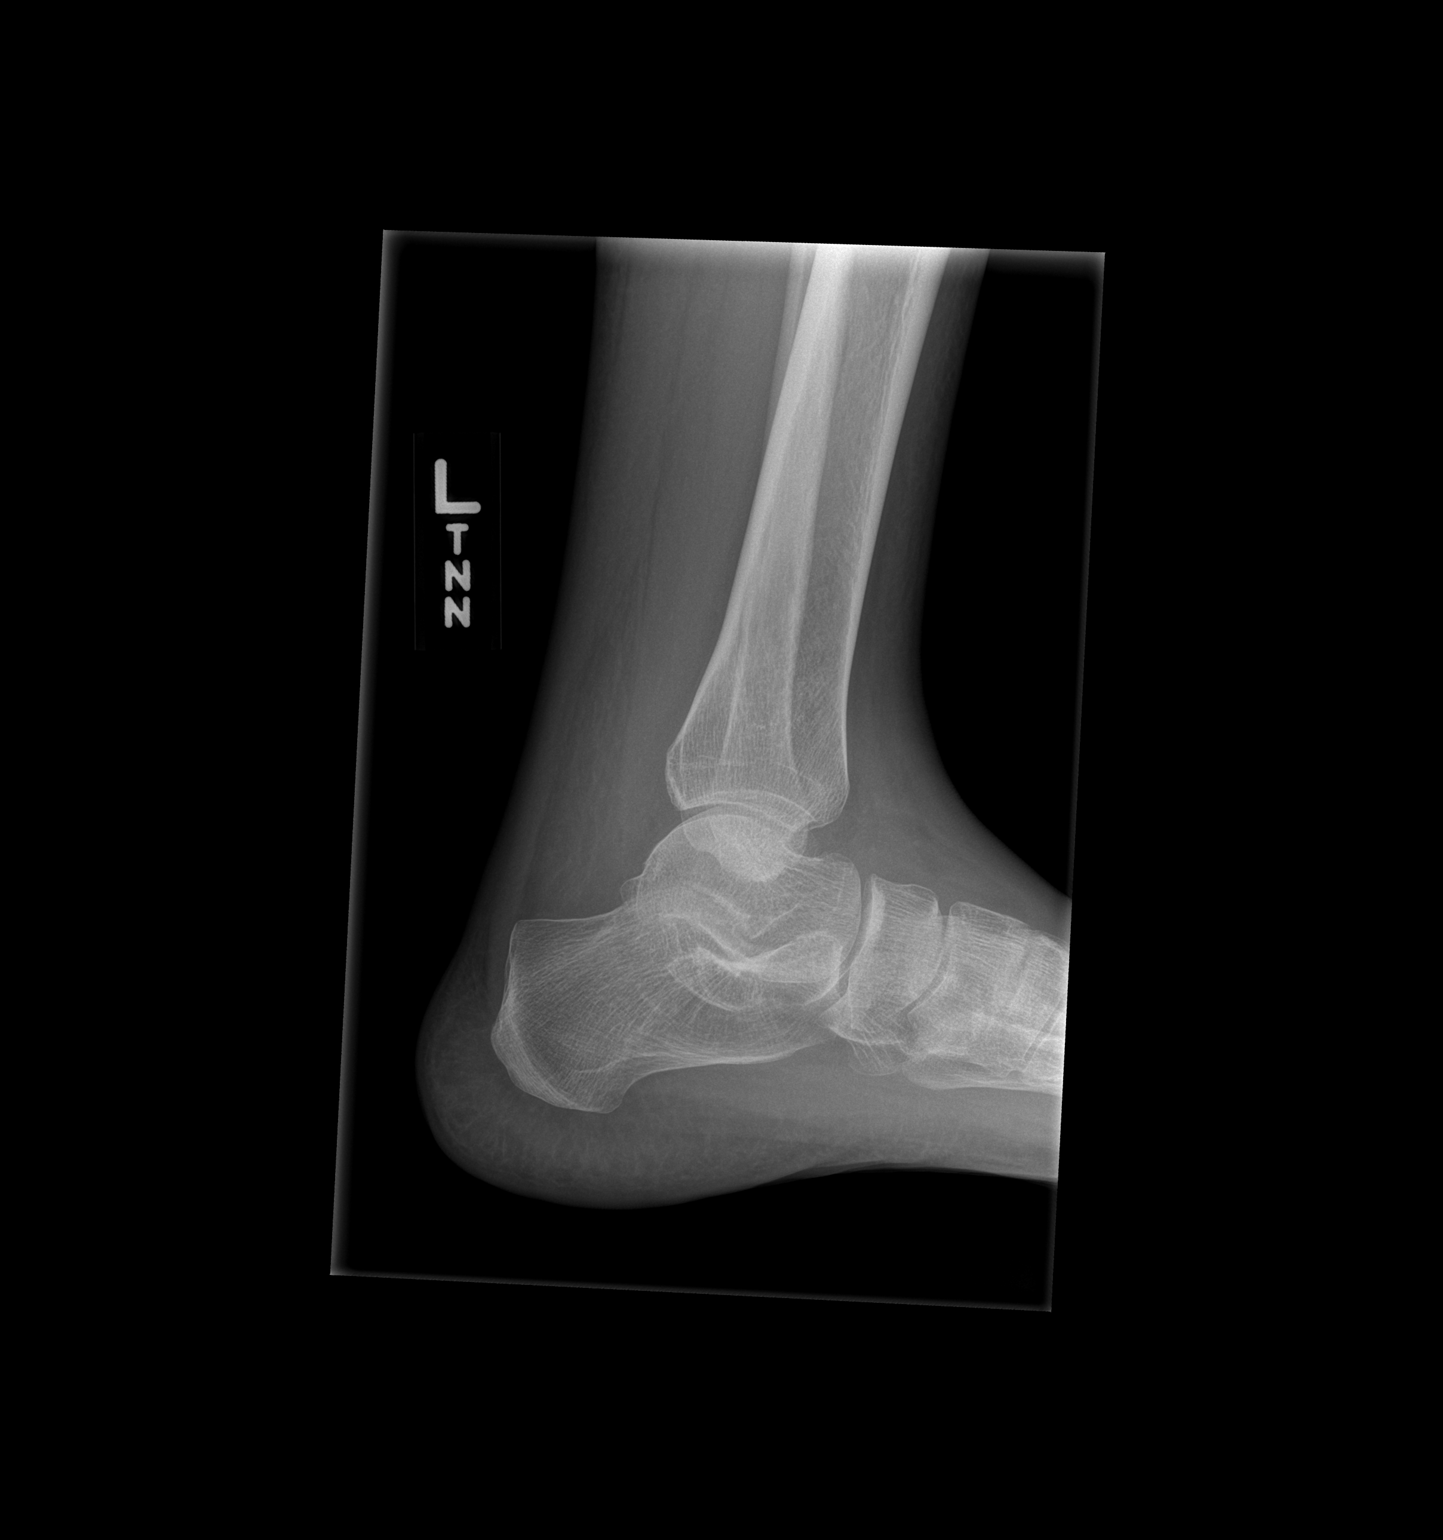

[3 of 3 positions shown; findings below may reference images not displayed]

FINDINGS: Diffuse soft tissue swelling is seen surrounding the left ankle. No
evidence of fracture, dislocation or bony destruction. The ankle
mortise shows normal alignment. No bony lesions or visible bony
infarcts by x-ray. No significant degenerative changes.
IMPRESSION: Soft tissue swelling surrounding left ankle. No underlying bony
abnormalities are identified.

## 2017-07-22 IMAGING — CR DG CHEST 2V
2 series · 2 of 2 positions shown · non-contrast
Comparison: 08/24/2014.

CLINICAL DATA: Sickle cell crisis. Chest pain. Severe bilateral
lower extremity pain.

EXAM:
CHEST  2 VIEW

[w chest pa]
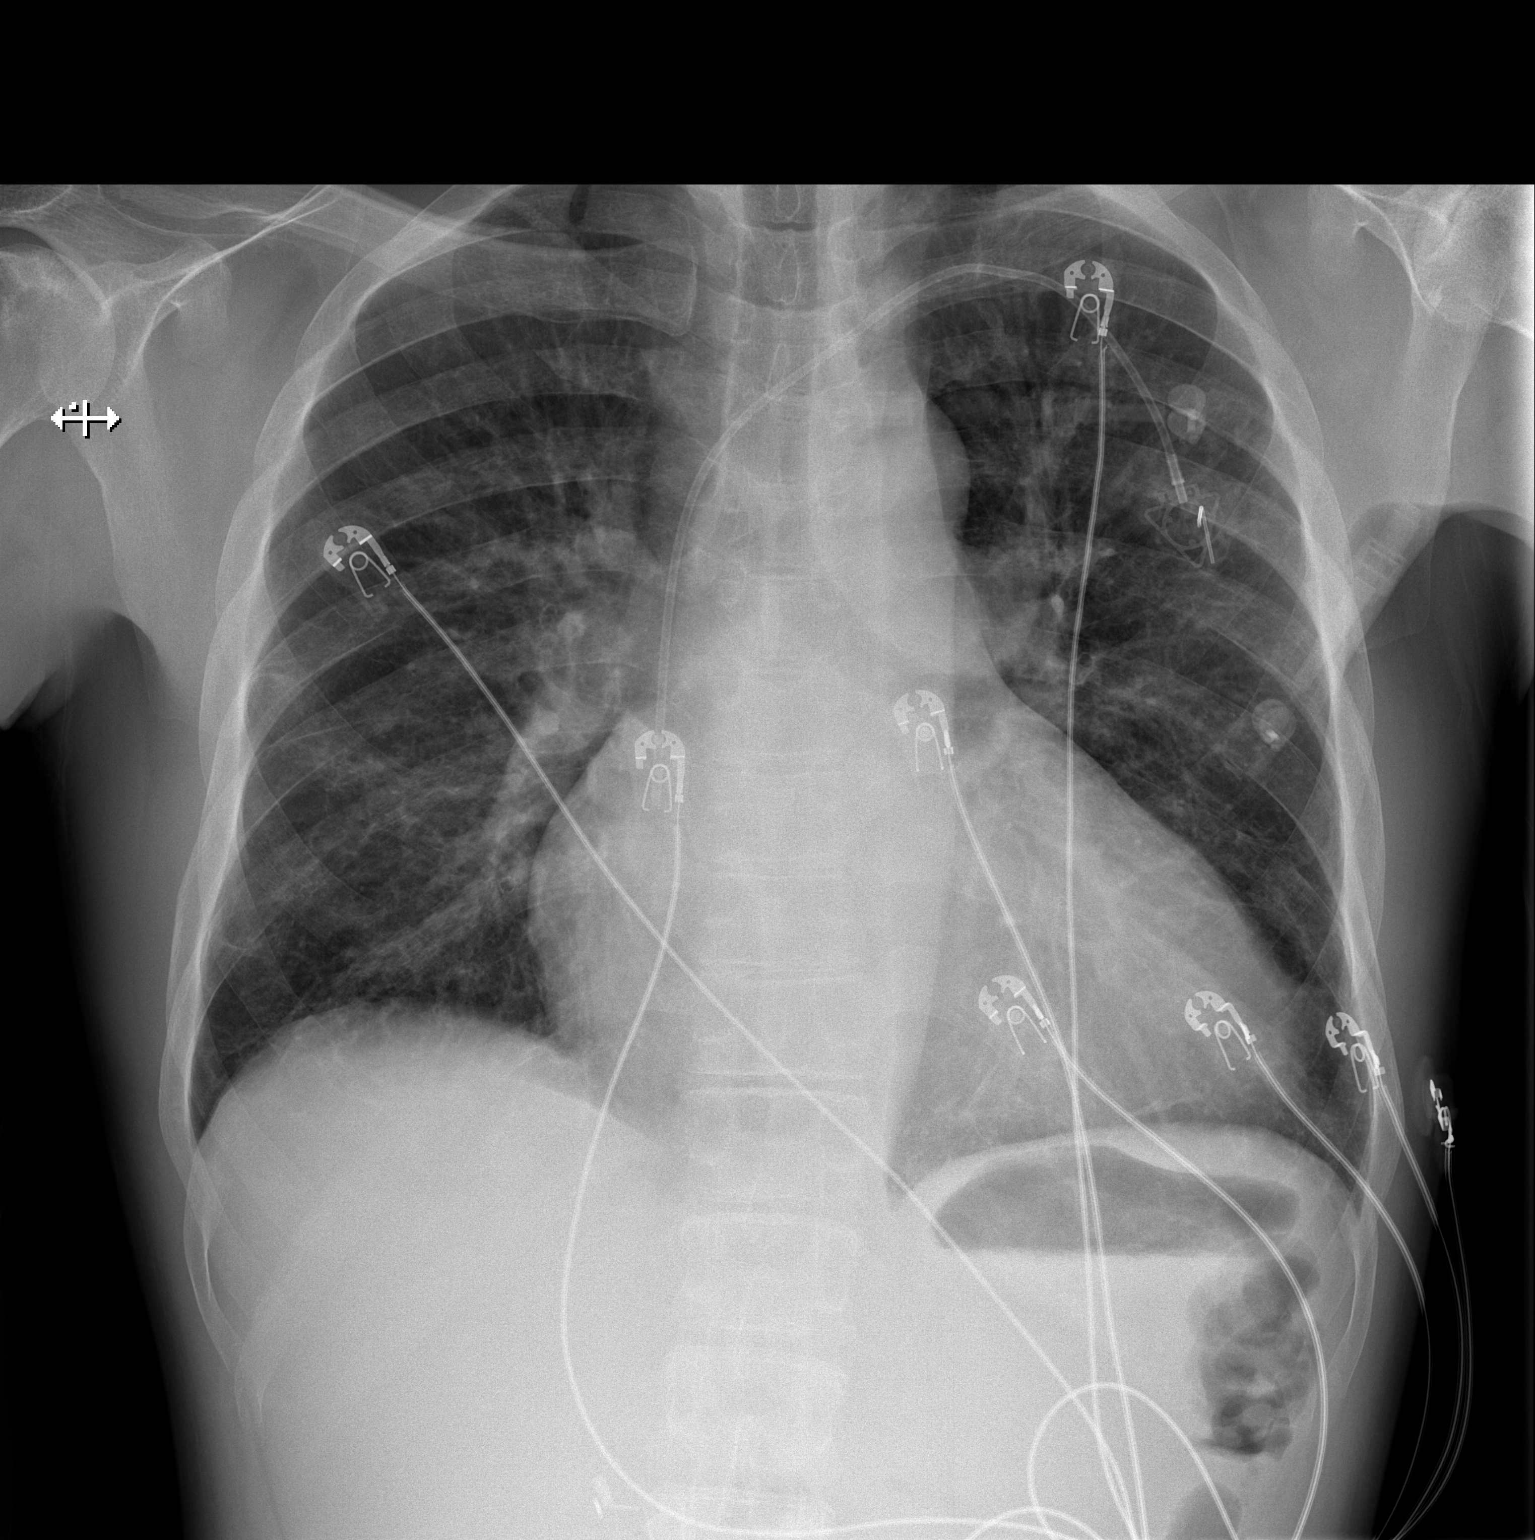

[w chest lat]
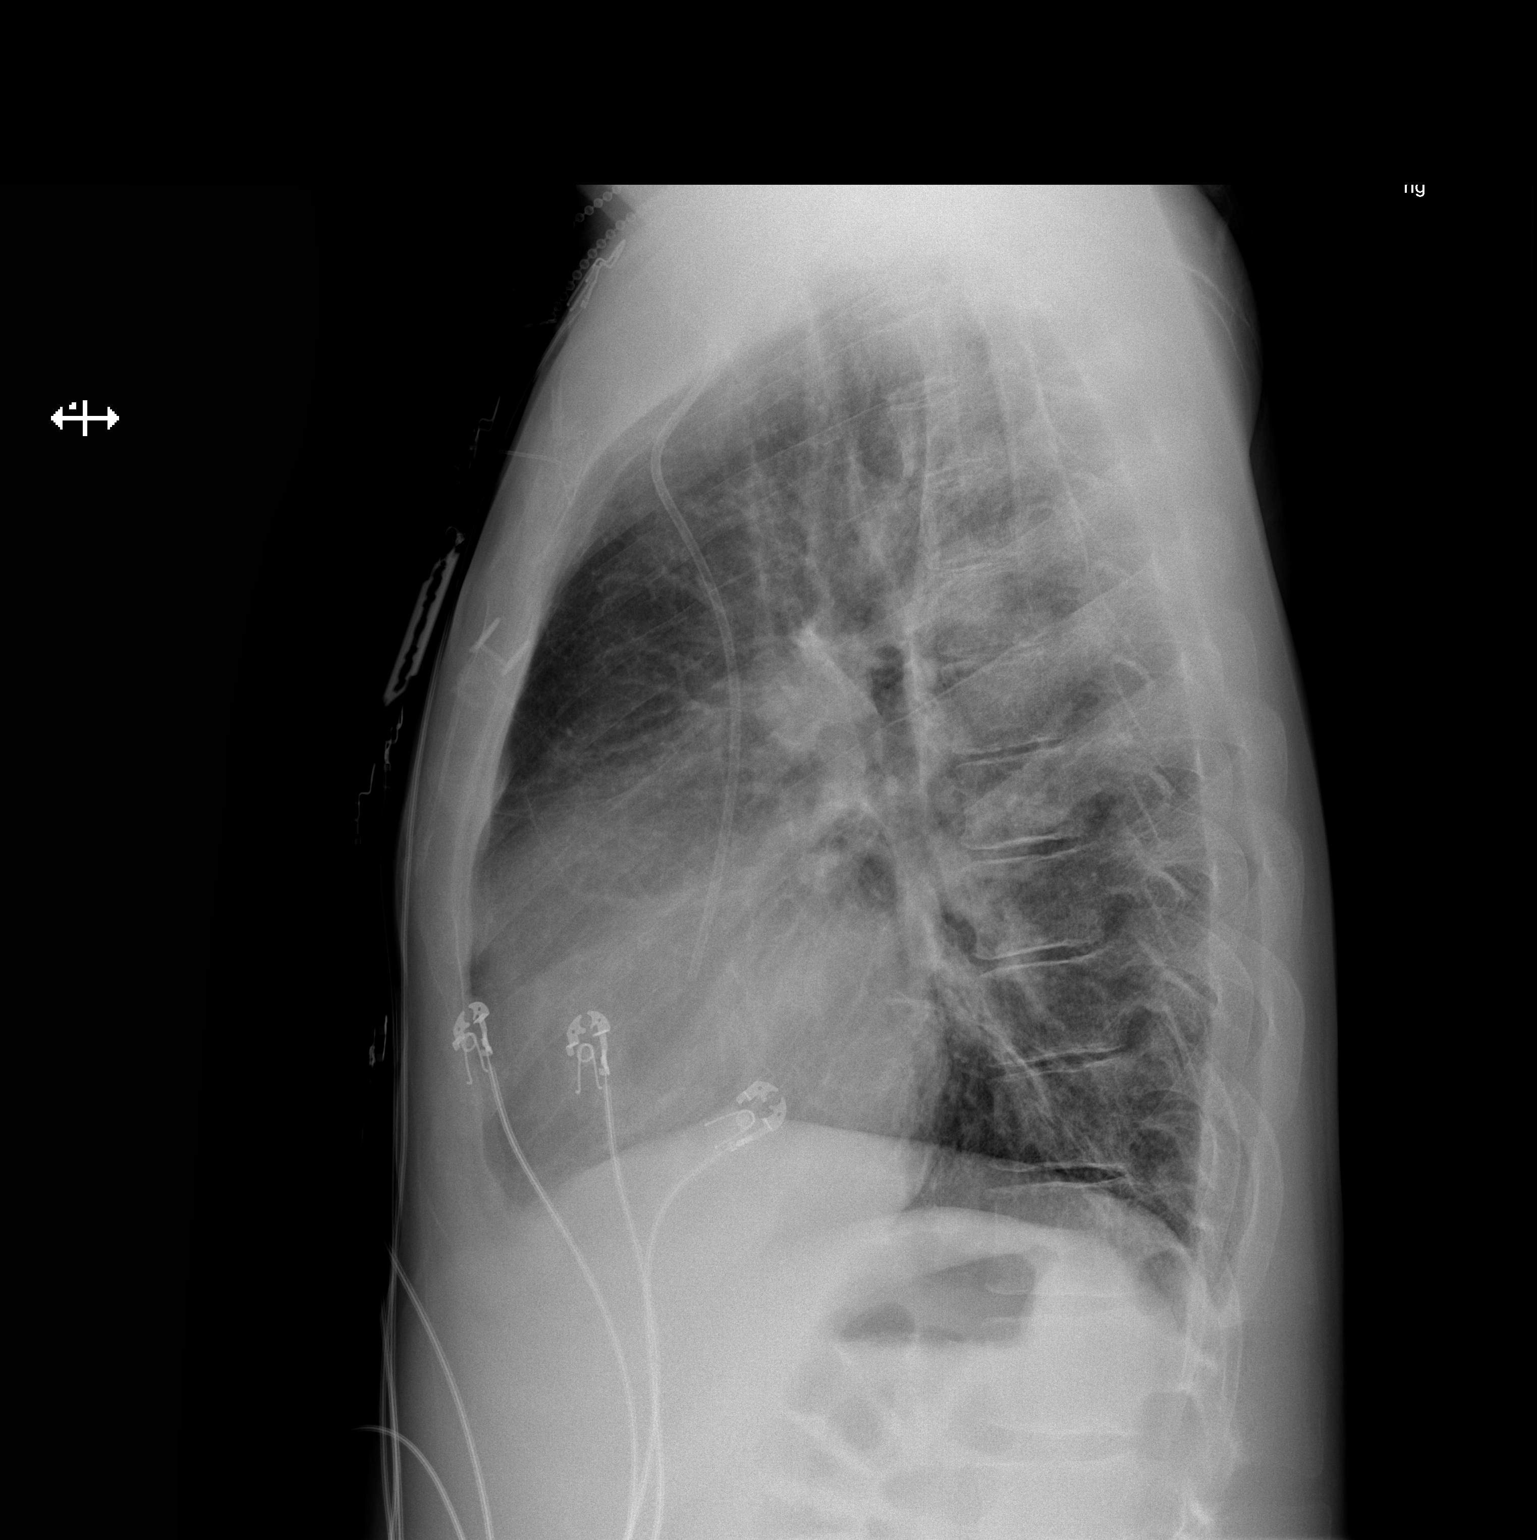

[2 of 2 positions shown; findings below may reference images not displayed]

FINDINGS: No change. Artifact overlies the chest. Cardiac silhouette remains
enlarged. Mediastinal shadows are otherwise normal. Power port
inserted from the left subclavian vein has its tip in the SVC just
above the right atrium. Vascular plethora remains present without
edema or effusions. Pulmonary scarring remains present.
IMPRESSION: No acute finding. Cardiomegaly. Pulmonary vascular plethora.
Pulmonary scarring.

## 2017-07-22 IMAGING — CT CT ANGIO CHEST
2 of 6 series · 18 of 36 positions shown · IV contrast (OMNIPAQUE)
Comparison: CT chest 07/20/2014

CLINICAL DATA: Chest pain.  Sickle cell.  Recent pulmonary embolism

EXAM:
CT ANGIOGRAPHY CHEST WITH CONTRAST
TECHNIQUE: Multidetector CT imaging of the chest was performed using the
standard protocol during bolus administration of intravenous
contrast. Multiplanar CT image reconstructions and MIPs were
obtained to evaluate the vascular anatomy.
CONTRAST:  100mL OMNIPAQUE IOHEXOL 350 MG/ML SOLN

[Series 6: pe thins @ 1mm · axial · 0.70mm/px · z∈[-288,-26]mm · 17 of 292 slices shown]
[im 15/292  lung]
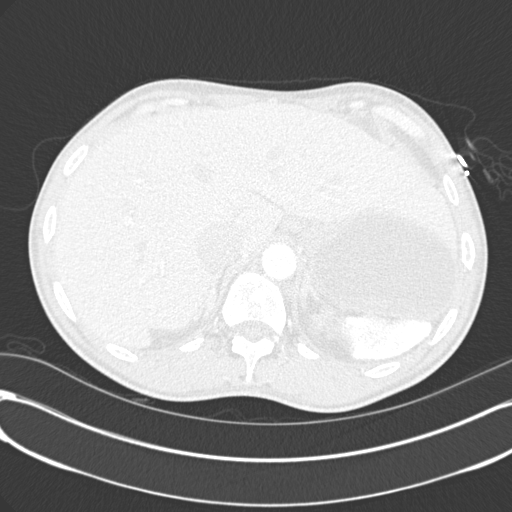
[im 30/292  mediastinal]
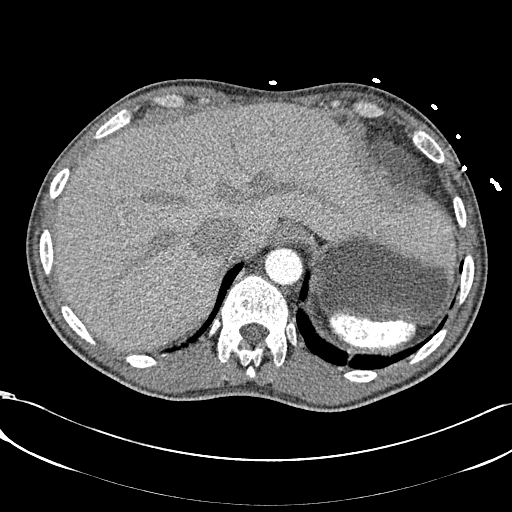
[im 44/292  lung]
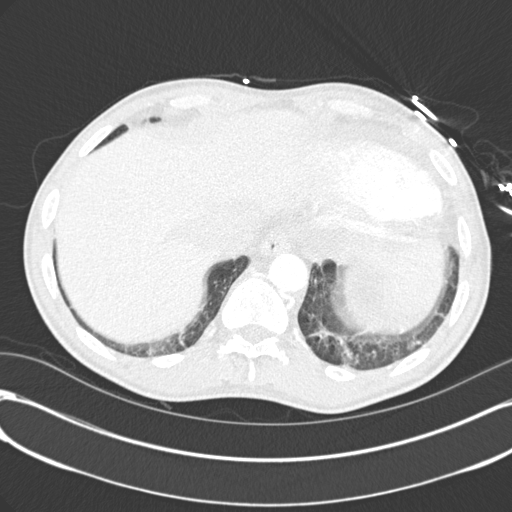
[im 59/292  mediastinal]
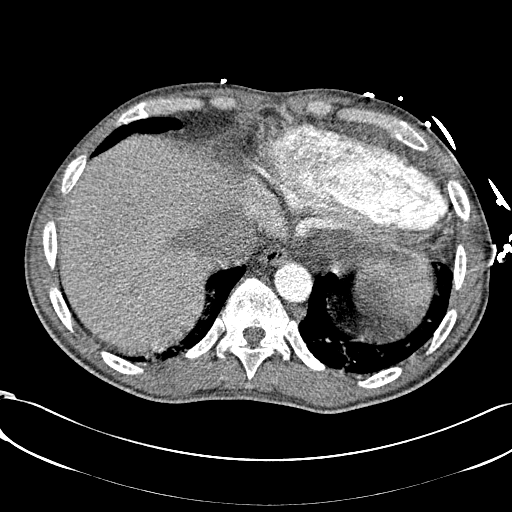
[im 88/292  lung]
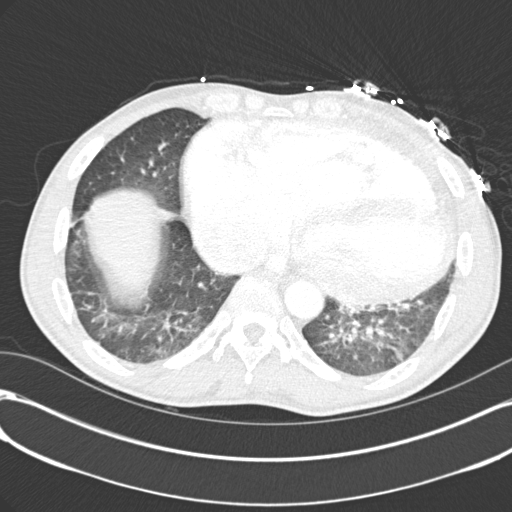
[im 102/292  mediastinal]
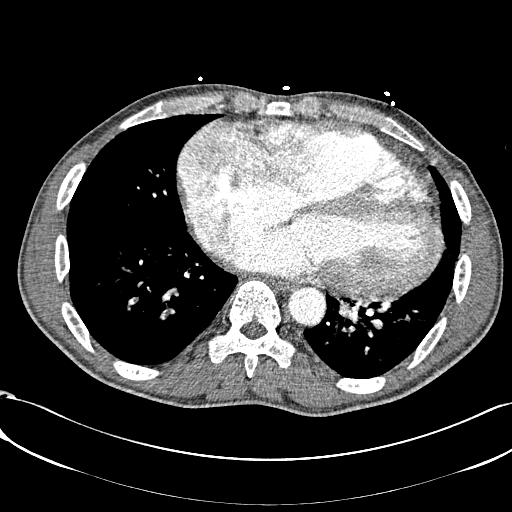
[im 117/292  lung]
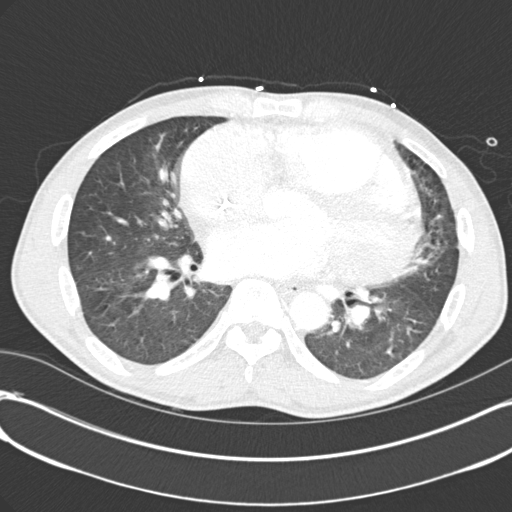
[im 131/292  mediastinal]
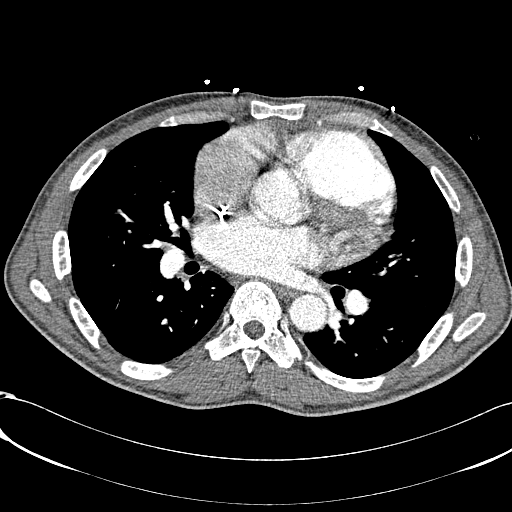
[im 146/292  lung]
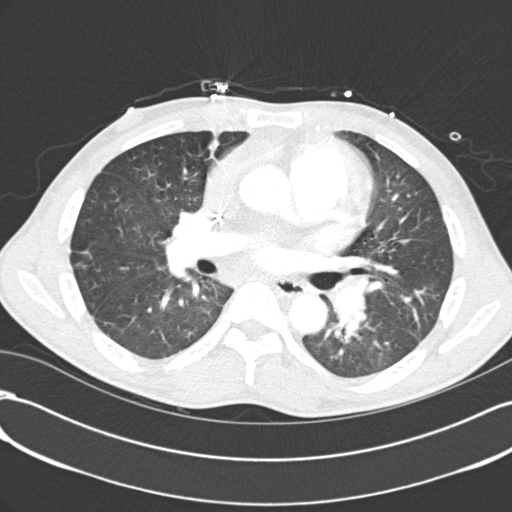
[im 161/292  mediastinal]
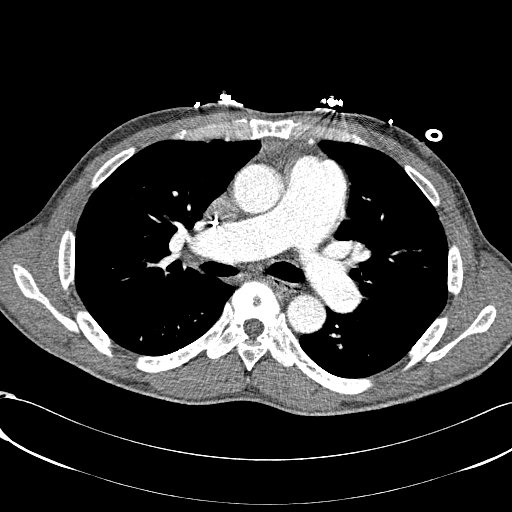
[im 175/292  lung]
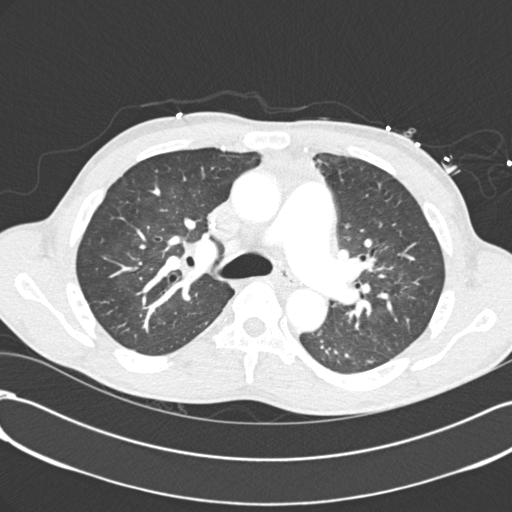
[im 190/292  mediastinal]
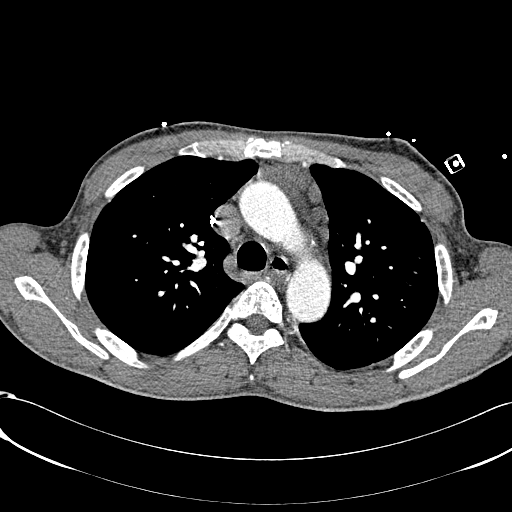
[im 204/292  lung]
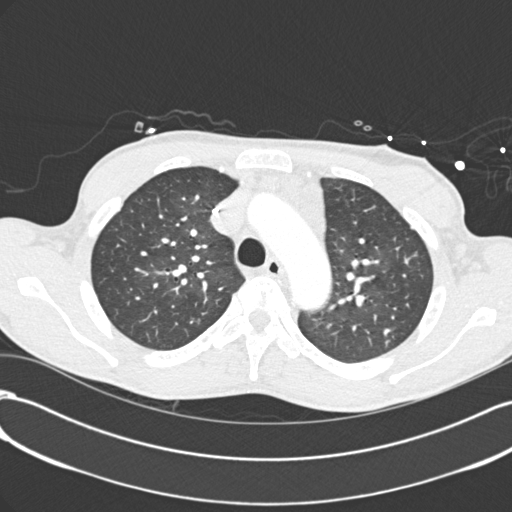
[im 233/292  mediastinal]
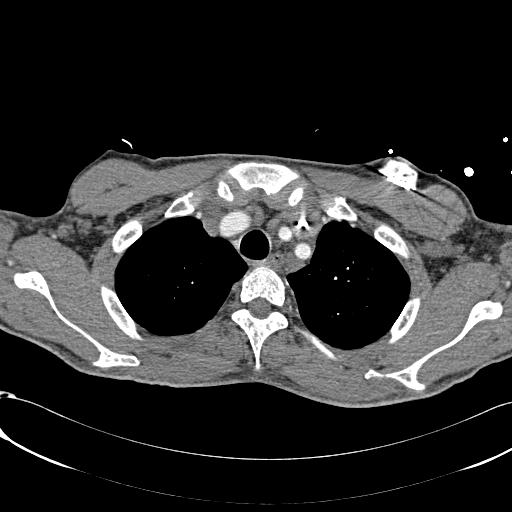
[im 248/292  lung]
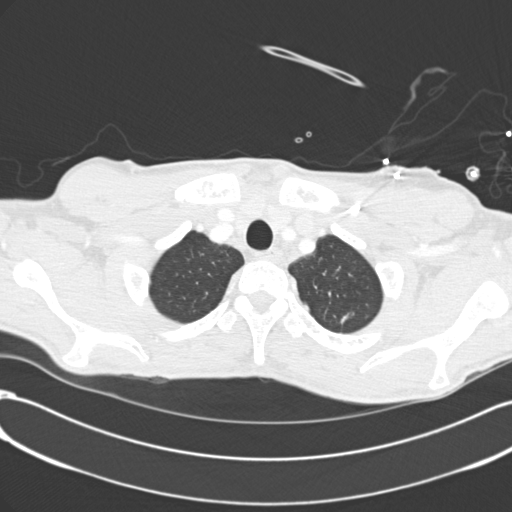
[im 262/292  mediastinal]
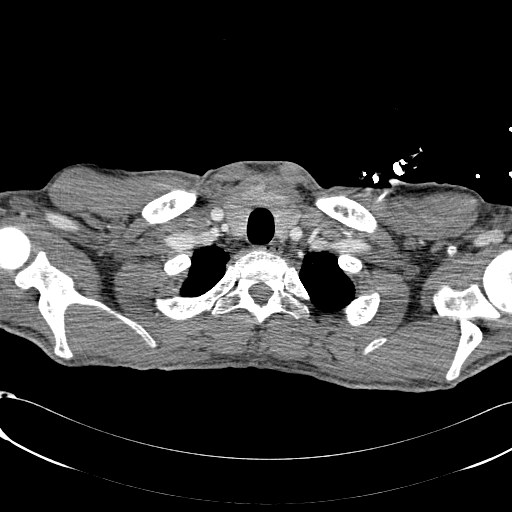
[im 277/292  lung]
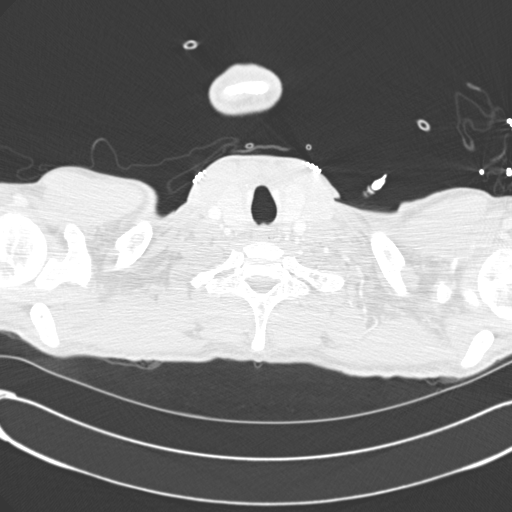

[Series 602: <mpr thick range> · coronal · 0.70mm/px · 1 of 123 slices shown]
[im 62/123  mediastinal]
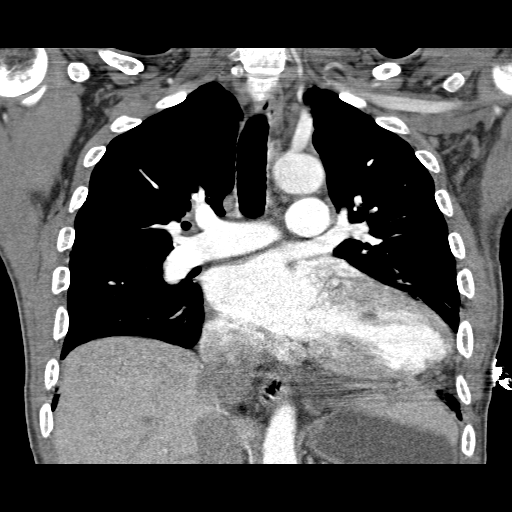

[18 of 36 positions shown; findings below may reference images not displayed]

FINDINGS: Small filling defects in the lower lobe pulmonary arteries are
unchanged from the prior study and consistent with residual
pulmonary emboli which are not dissolved. No new emboli identified.
It is unlikely these are recurrent emboli as the are in the exact
same location as the prior study.

Cardiac enlargement without pericardial effusion. Right ventricular
enlargement. RV/LV ratio is 1.5 suggesting right heart strain.
Pulmonary arteries are enlarged.

Port-A-Cath in the SVC unchanged.  Because

Anterior mediastinal adenopathy measuring up to 12 mm unchanged. No
hilar adenopathy.

Patchy airspace densities in the posterior lung bases bilaterally
unchanged most consistent with atelectasis or scarring. Additional
ground-glass density in the lungs bilaterally most notably in the
right lung are unchanged and may be related to chronic lung disease
or pulmonary emboli.

Upper abdomen negative.  Negative for   effusion.

Review of the MIP images confirms the above findings.
IMPRESSION: Small filling defects in the lower lobe pulmonary arteries unchanged
from the CT of 07/20/2014 compatible with residual emboli which have
not dissolved. No new emboli identified.

Cardiac enlargement. Pulmonary artery enlargement. Enlarged right
ventricle. RV/LV ratio 1.5 consistent with right ventricle strain
likely due to pulmonary hypertension.

Critical Value/emergent results were called by telephone at the time
of interpretation on 08/29/2014 at [DATE] to Dr. LOVORKO URLOVIC ,
who verbally acknowledged these results.

## 2017-08-03 IMAGING — CR DG CHEST 2V
2 series · 2 of 2 positions shown · non-contrast
Comparison: Chest radiograph from 08/29/2014

CLINICAL DATA: Acute onset of sickle cell pain crisis. Decreased O2
saturation. Worsening generalized weakness. Initial encounter.

EXAM:
CHEST  2 VIEW

[x chest ap]
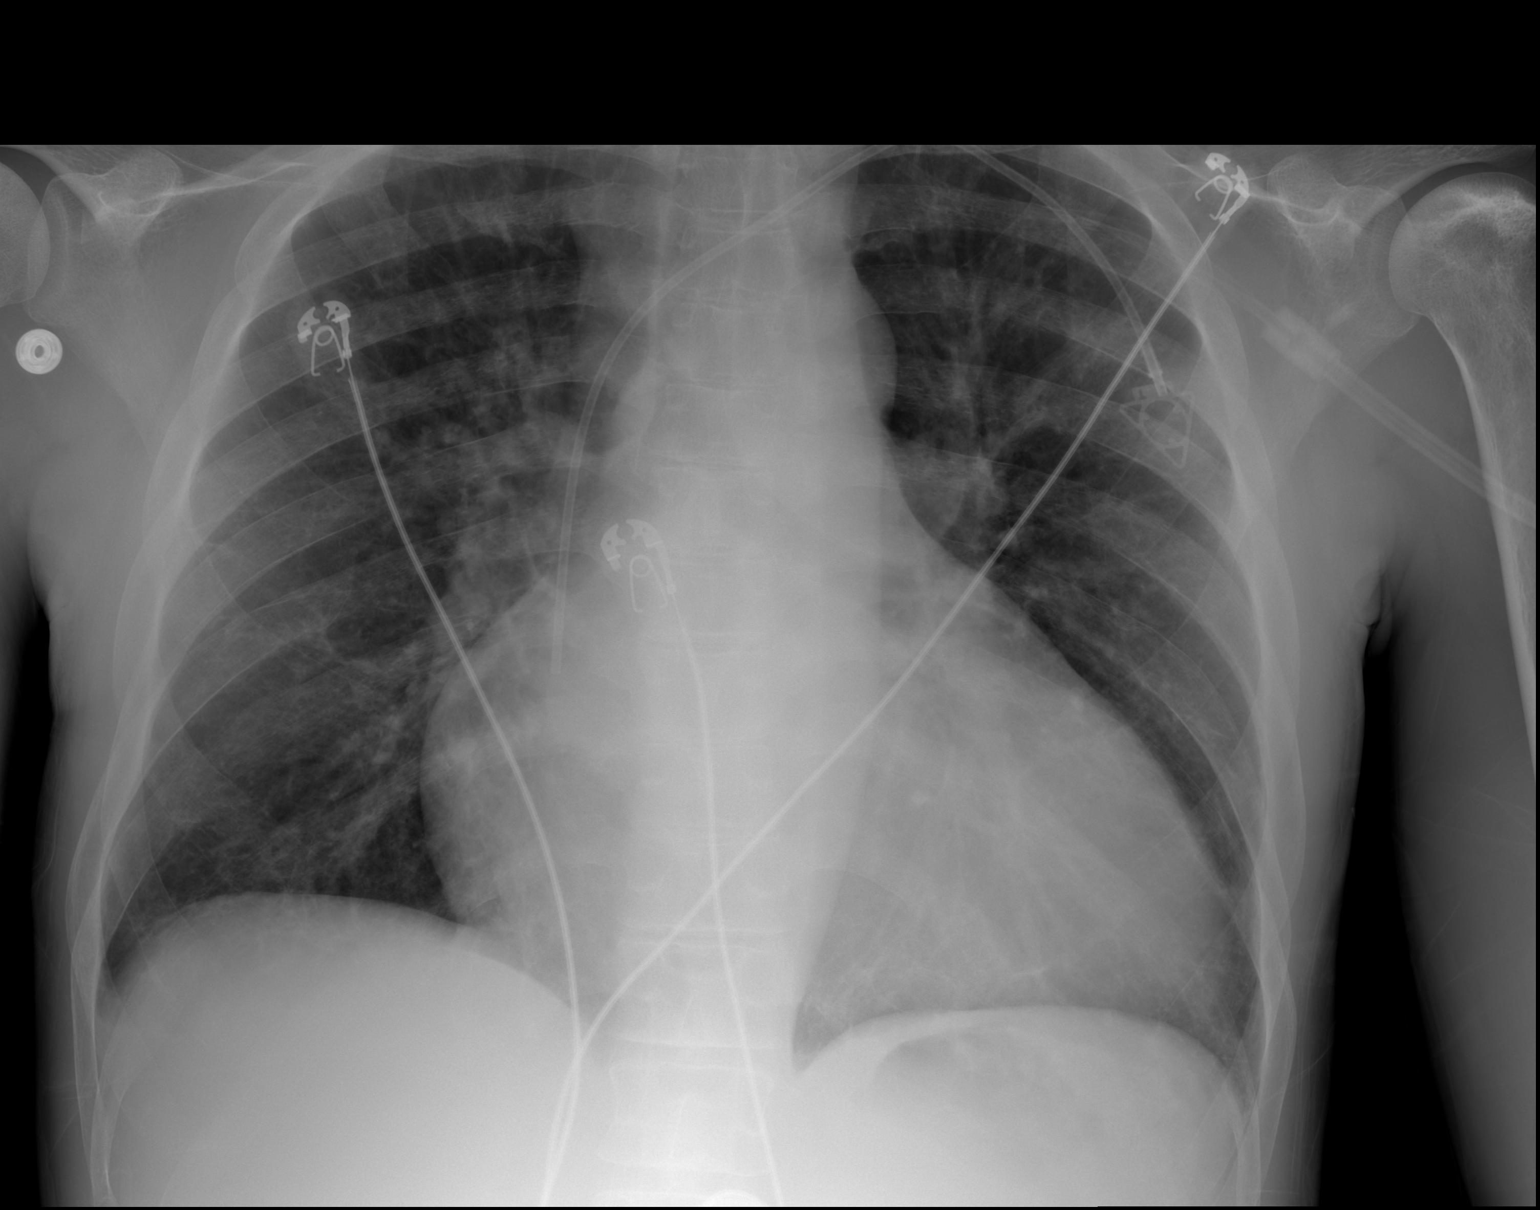

[w chest lat]
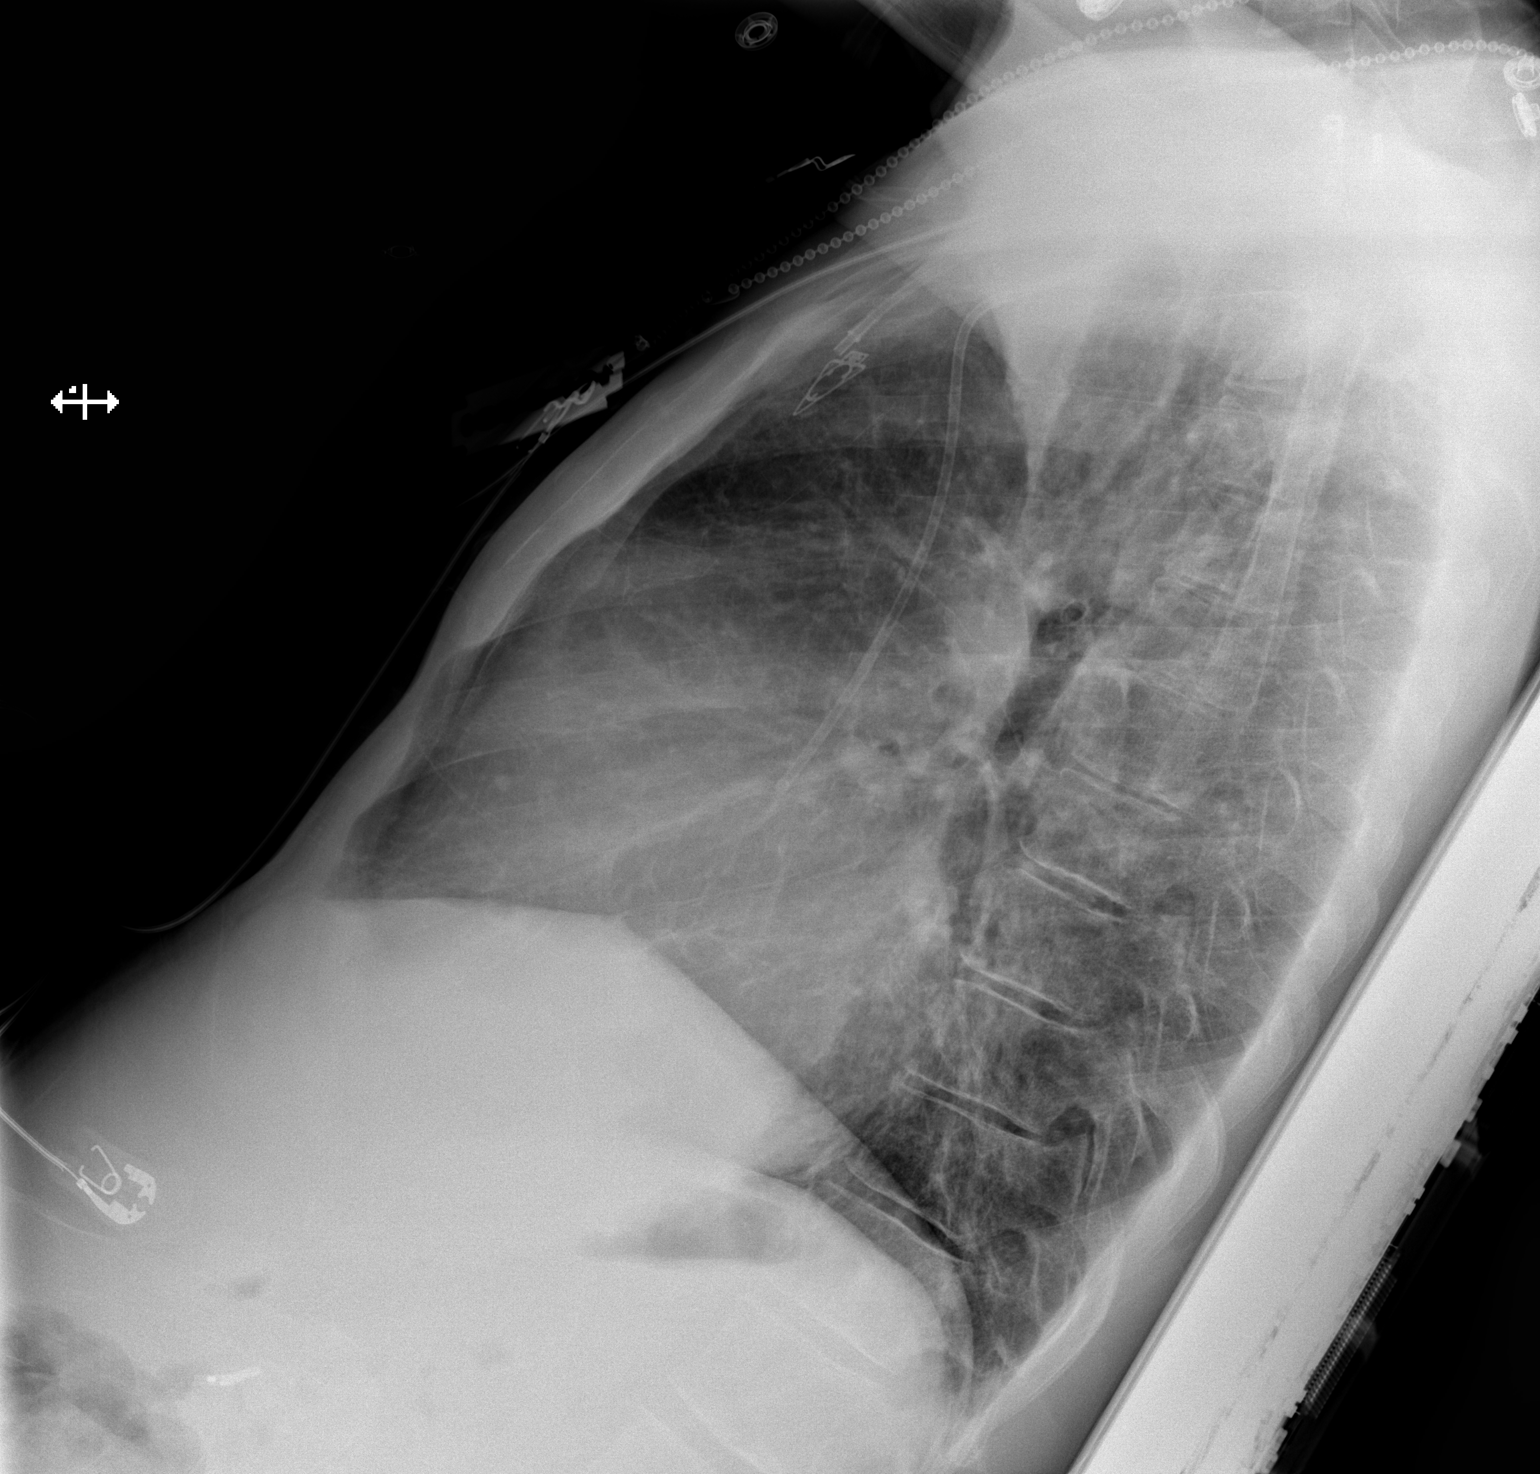

[2 of 2 positions shown; findings below may reference images not displayed]

FINDINGS: The lungs are well-aerated. Mild vascular congestion is noted.
Minimal bilateral atelectasis or scarring is noted. There is no
evidence of pleural effusion or pneumothorax.

The heart is enlarged. A left-sided chest port is noted ending about
the cavoatrial junction. No acute osseous abnormalities are seen.
Clips are noted within the right upper quadrant, reflecting prior
cholecystectomy.
IMPRESSION: Mild vascular congestion and cardiomegaly noted. Minimal bilateral
atelectasis or scarring noted.

## 2017-08-03 IMAGING — DX DG CHEST 1V
1 series · 1 of 1 positions shown · non-contrast
Comparison: Radiographs and CT earlier today.

CLINICAL DATA: Respiratory failure. Hypoxemia. Sickle cell patient.

EXAM:
CHEST  1 VIEW

[chest ap]
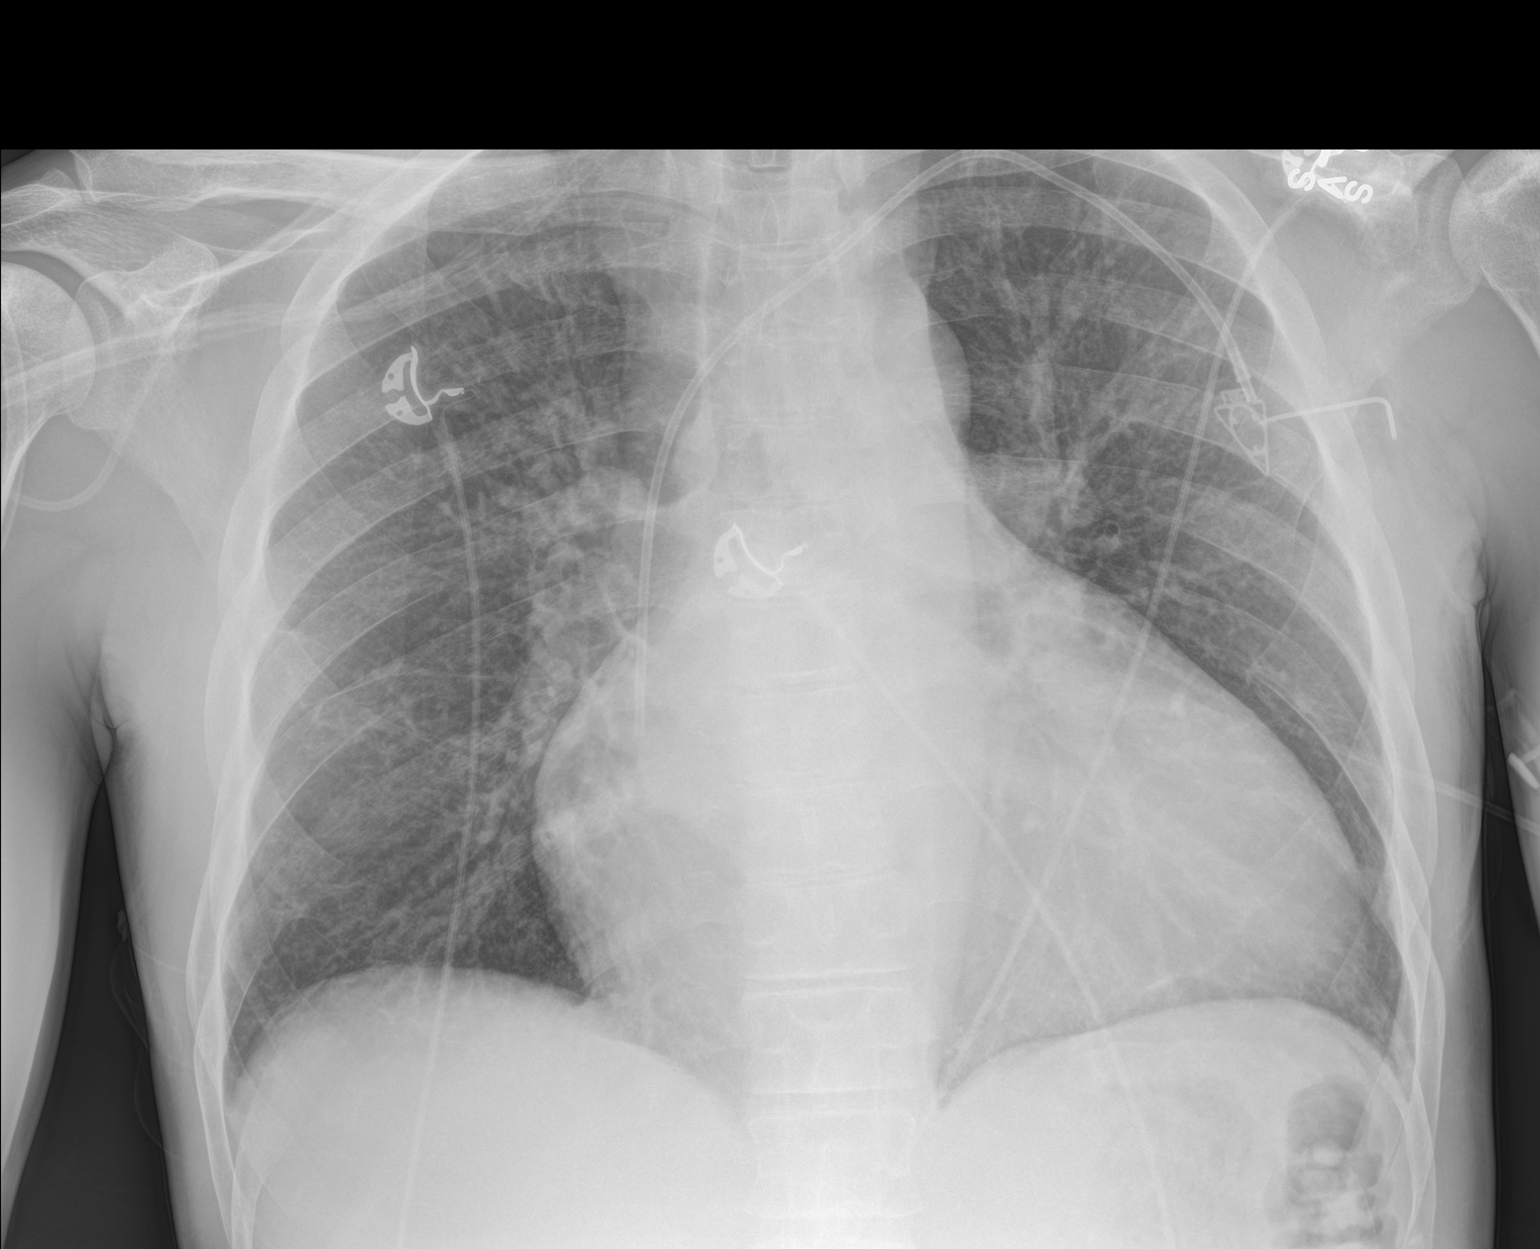

[1 of 1 positions shown; findings below may reference images not displayed]

FINDINGS: 7470 hour. Stable cardiomegaly. The left subclavian Port-A-Cath is
unchanged at the SVC right atrial junction. Stable chronic vascular
congestion and basilar scarring. No superimposed edema, confluent
airspace opacity or significant pleural effusion. Chronic osseous
findings of sickle cell anemia again noted.
IMPRESSION: Stable examination compared with earlier study today. Chronic
cardiomegaly and chronic vascular congestion.

## 2017-08-05 IMAGING — CR DG CHEST 1V PORT
1 series · 1 of 1 positions shown · non-contrast
Comparison: Chest radiograph from 1 day prior.

CLINICAL DATA: Respiratory failure.

EXAM:
PORTABLE CHEST - 1 VIEW

[AP]
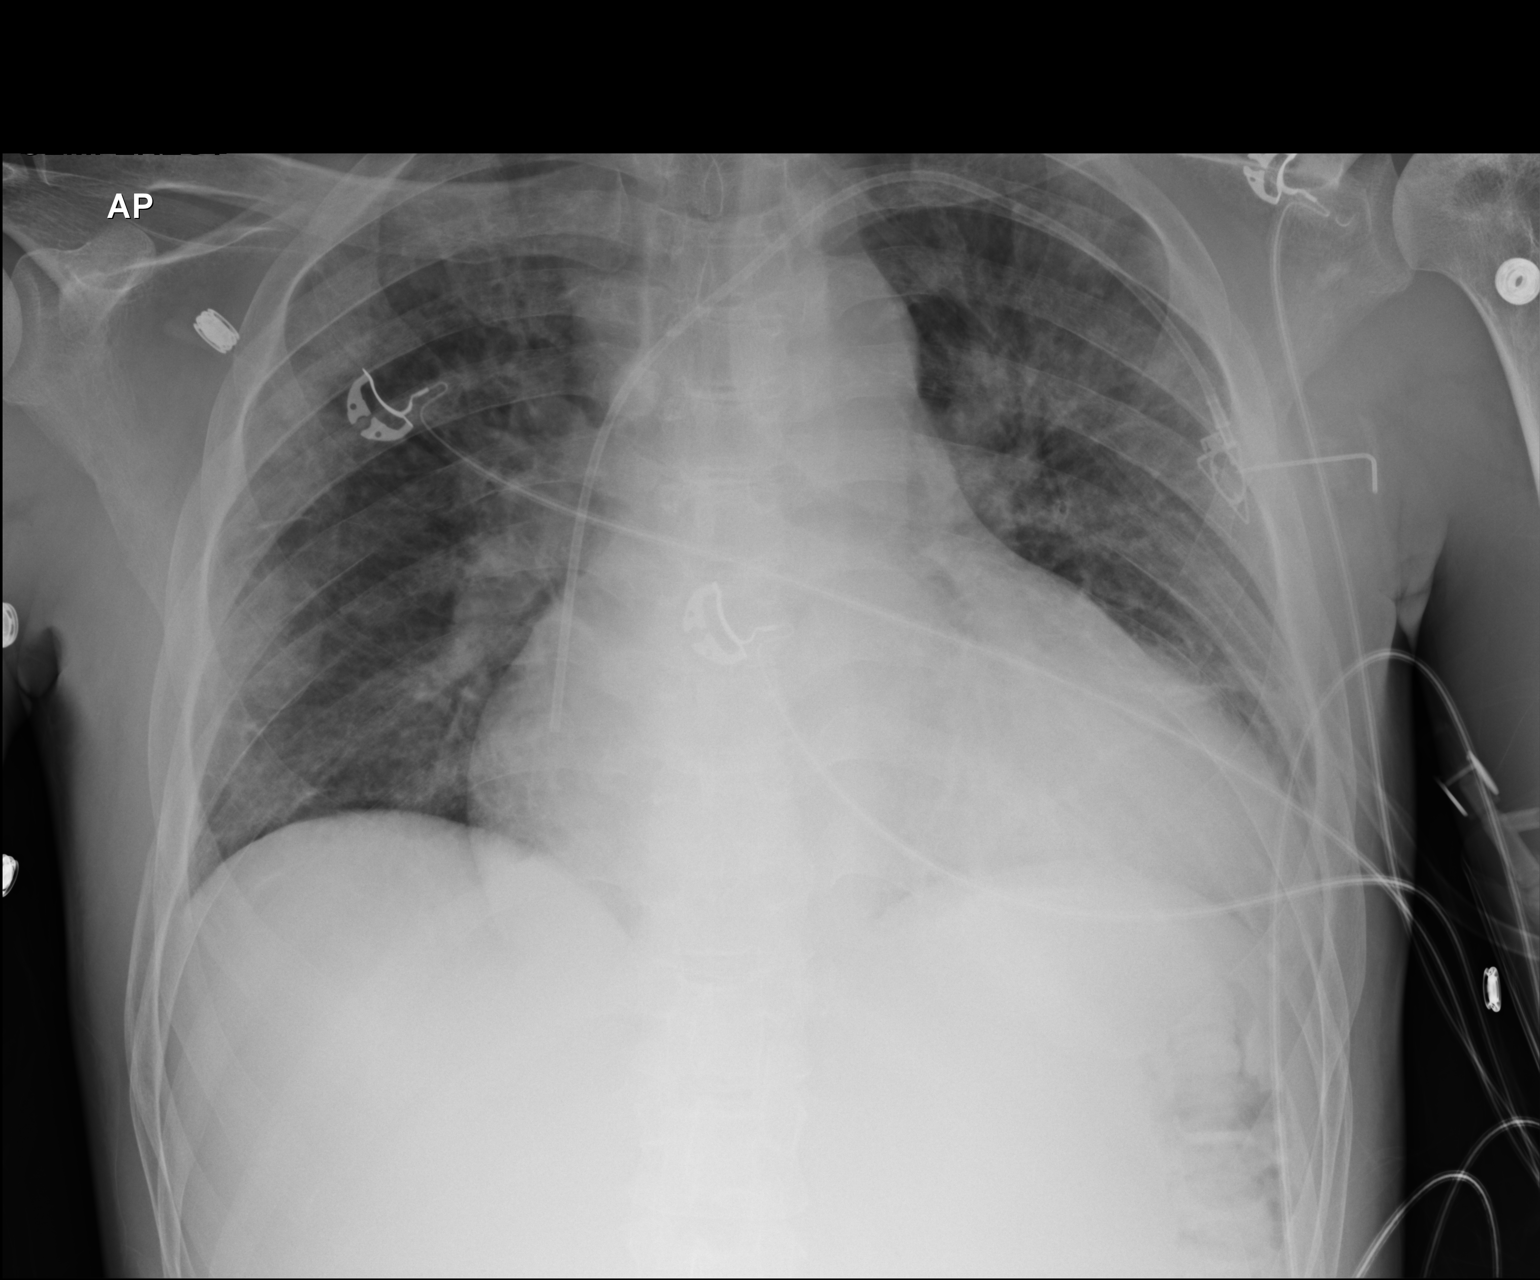

[1 of 1 positions shown; findings below may reference images not displayed]

FINDINGS: Left subclavian central venous catheter terminates near the
cavoatrial junction. Stable cardiomediastinal silhouette with mild
to moderate cardiomegaly. No pneumothorax. No pleural effusion.
There is stable mild to moderate pulmonary edema. No new focal lung
opacity. Stable mild scarring versus atelectasis in the mid to lower
left lung.
IMPRESSION: 1. Stable mild to moderate congestive heart failure.
2. Stable mild scarring versus atelectasis in the basilar left lung.

## 2017-08-08 IMAGING — NM NM PULMONARY VENT & PERF
16 series · 16 of 16 positions shown · non-contrast
Comparison: CT 09/18/2014

CLINICAL DATA: Hypoxia. Pulmonary embolus on demonstrated on CT of
09/10/2014.

EXAM:
NUCLEAR MEDICINE VENTILATION - PERFUSION LUNG SCAN
TECHNIQUE: Ventilation images were obtained in multiple projections using
inhaled aerosol Ic-EEm DTPA. Perfusion images were obtained in
multiple projections after intravenous injection of Ic-EEm MAA.
RADIOPHARMACEUTICALS:  Thirty-eight mCi Gechnetium-WWm DTPA aerosol
inhalation and 5 mCi Gechnetium-WWm MAA IV

[Series 1: ant post vent · 2.07mm/px · 1 of 1 slices shown (1 of 2)]
[im 1/1  full-range]
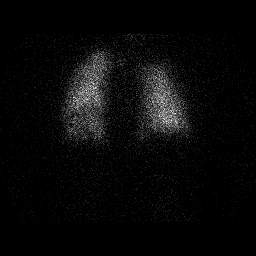

[Series 1: ant post vent · 2.07mm/px · 1 of 1 slices shown (2 of 2)]
[im 1/1  full-range]
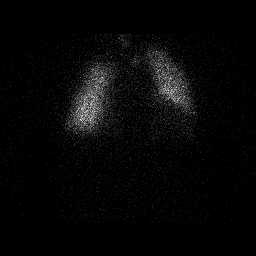

[Series 2: lao-rpo vent · 2.07mm/px · 1 of 1 slices shown (1 of 2)]
[im 1/1  full-range]
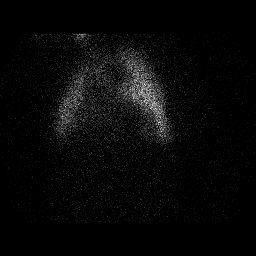

[Series 2: lao-rpo vent · 2.07mm/px · 1 of 1 slices shown (2 of 2)]
[im 1/1  full-range]
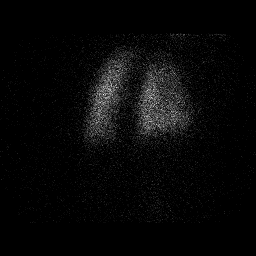

[Series 3: llat-rlat vent · 2.07mm/px · 1 of 1 slices shown (1 of 2)]
[im 1/1]
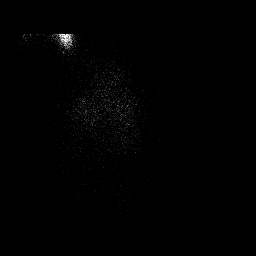

[Series 3: llat-rlat vent · 2.07mm/px · 1 of 1 slices shown (2 of 2)]
[im 1/1]
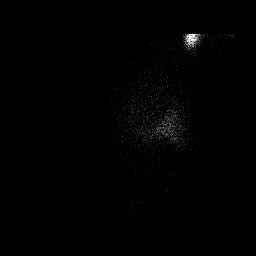

[Series 4: lpo-rao vent · 2.07mm/px · 1 of 1 slices shown (1 of 2)]
[im 1/1  full-range]
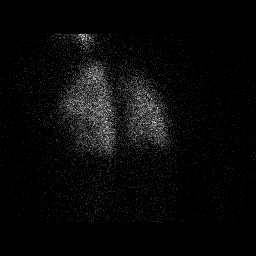

[Series 4: lpo-rao vent · 2.07mm/px · 1 of 1 slices shown (2 of 2)]
[im 1/1  full-range]
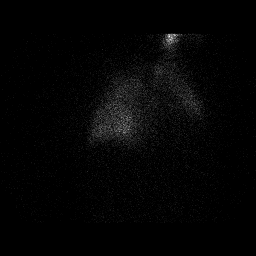

[Series 5: lpo-rao perf · 2.07mm/px · 1 of 1 slices shown (1 of 2)]
[im 1/1]
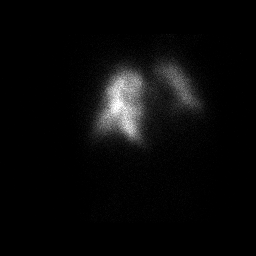

[Series 5: lpo-rao perf · 2.07mm/px · 1 of 1 slices shown (2 of 2)]
[im 1/1]
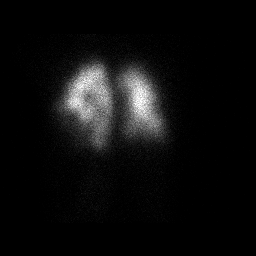

[Series 6: llat-rlat perf · 2.07mm/px · 1 of 1 slices shown (1 of 2)]
[im 1/1]
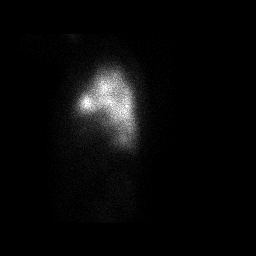

[Series 6: llat-rlat perf · 2.07mm/px · 1 of 1 slices shown (2 of 2)]
[im 1/1]
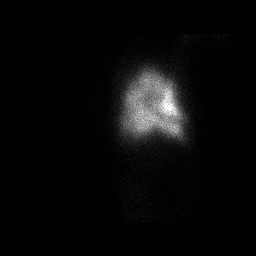

[Series 7: lao-rpo perf · 2.07mm/px · 1 of 1 slices shown (1 of 2)]
[im 1/1]
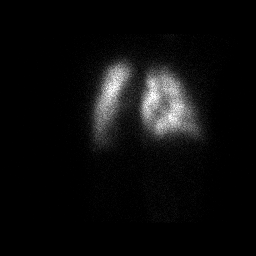

[Series 7: lao-rpo perf · 2.07mm/px · 1 of 1 slices shown (2 of 2)]
[im 1/1]
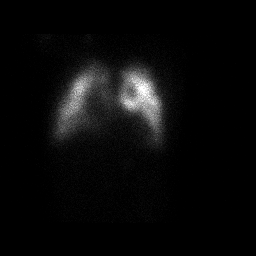

[Series 8: ant post perf · 2.07mm/px · 1 of 1 slices shown (1 of 2)]
[im 1/1]
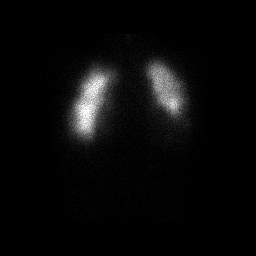

[Series 8: ant post perf · 2.07mm/px · 1 of 1 slices shown (2 of 2)]
[im 1/1]
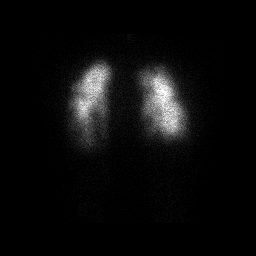

[16 of 16 positions shown; findings below may reference images not displayed]

FINDINGS: Ventilation: There is moderate decreased ventilation to the LEFT
lower lobe posteriorly.

Perfusion: There is decreased regional perfusion to the LEFT lower
lobe posteriorly which matches the ventilation defect but is
slightly more pronounced and corresponds with chronic pulmonary
emboli seen on comparison CT. There additional small peripheral
perfusion defects in the LEFT and RIGHT lungs.
IMPRESSION: 1. No clear evidence of acute pulmonary embolism.
2. Decreased perfusion and ventilation to the LEFT lower lobe with
the profusion decreased to a greater degree than the ventilation
corresponds to the chronic pulmonary emboli seen on comparison CT.
3. Multiple small peripheral perfusion defects also consistent
chronic PE.

## 2017-09-18 IMAGING — CT CT ANGIO CHEST
2 of 6 series · 18 of 46 positions shown · IV contrast (OMNIPAQUE 300)
Comparison: 09/10/2014.  Chest x-ray earlier today.

CLINICAL DATA: Sickle cell crisis.  Chest pain.  Low O2 sats.

EXAM:
CT ANGIOGRAPHY CHEST WITH CONTRAST
TECHNIQUE: Multidetector CT imaging of the chest was performed using the
standard protocol during bolus administration of intravenous
contrast. Multiplanar CT image reconstructions and MIPs were
obtained to evaluate the vascular anatomy.
CONTRAST:  100mL OMNIPAQUE IOHEXOL 350 MG/ML SOLN

[Series 6: thins for pacs · axial · 0.80mm/px · z∈[-301,-59]mm · 15 of 266 slices shown]
[im 12/266  lung]
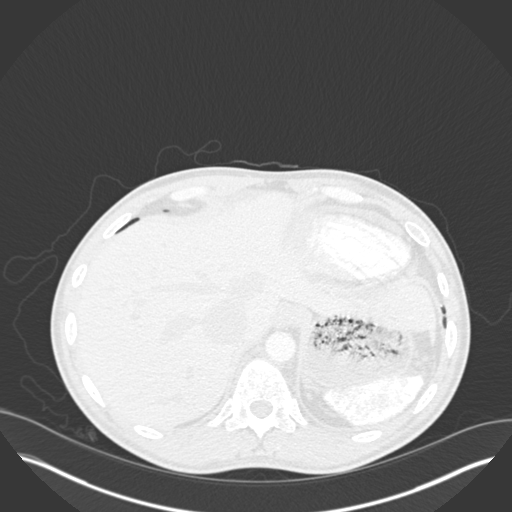
[im 35/266  soft-tissue]
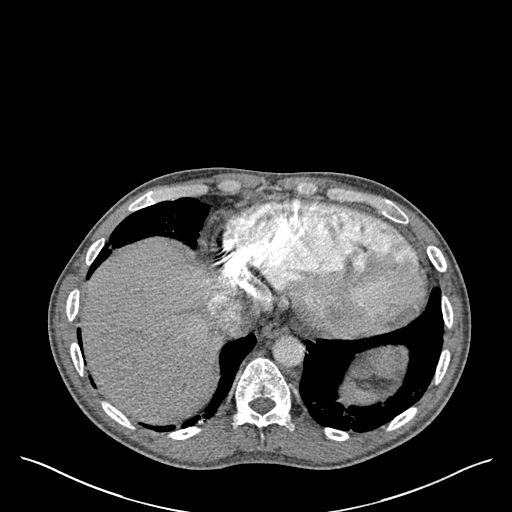
[im 47/266  lung]
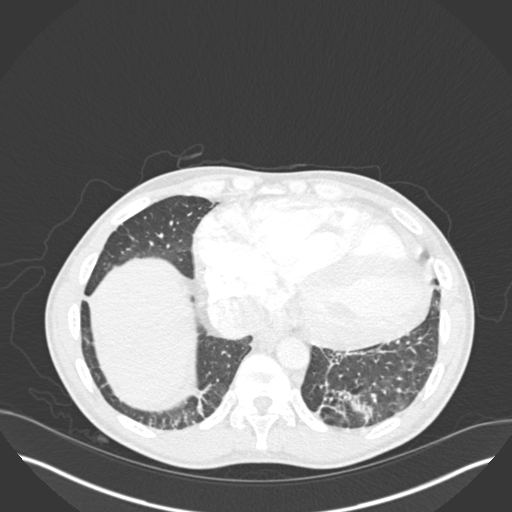
[im 70/266  soft-tissue]
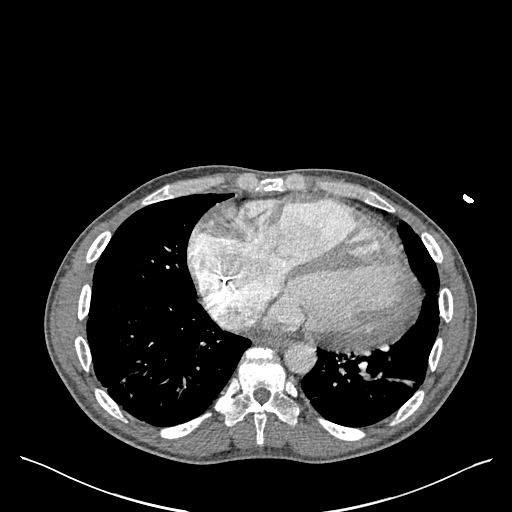
[im 81/266  lung]
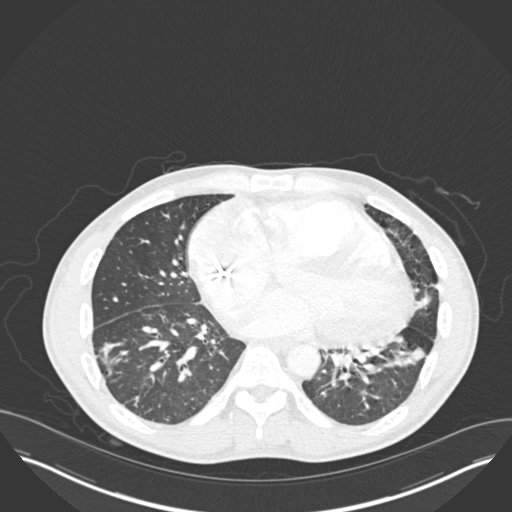
[im 104/266  soft-tissue]
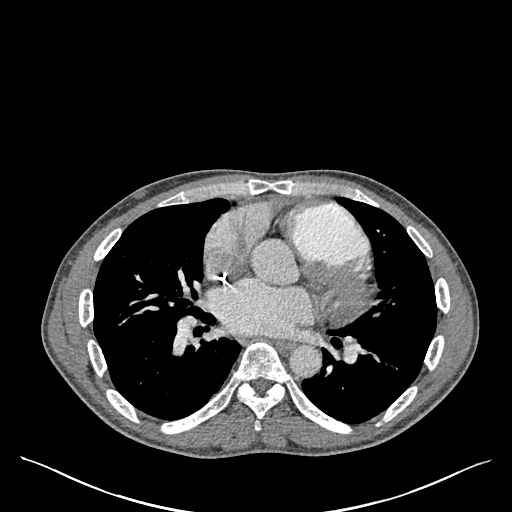
[im 116/266  lung]
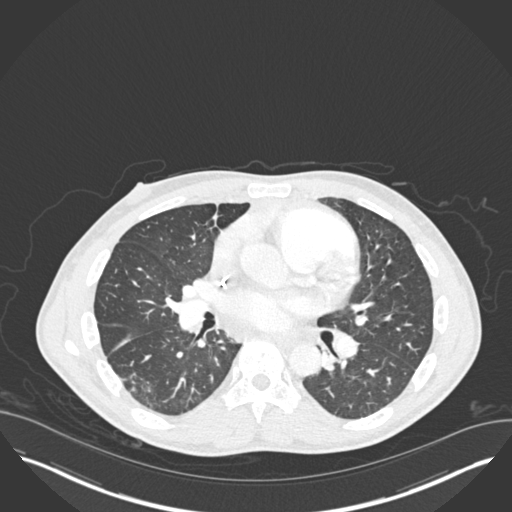
[im 139/266  soft-tissue]
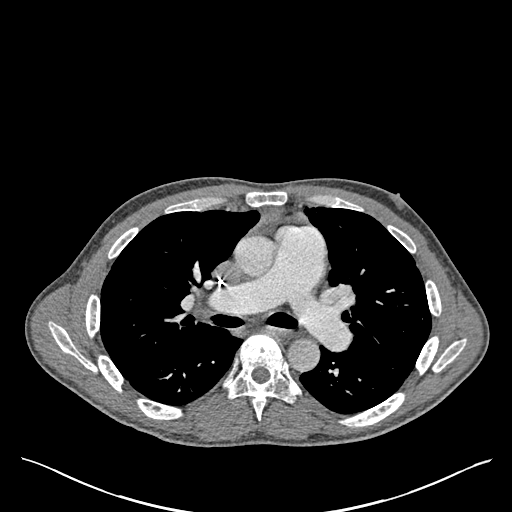
[im 150/266  lung]
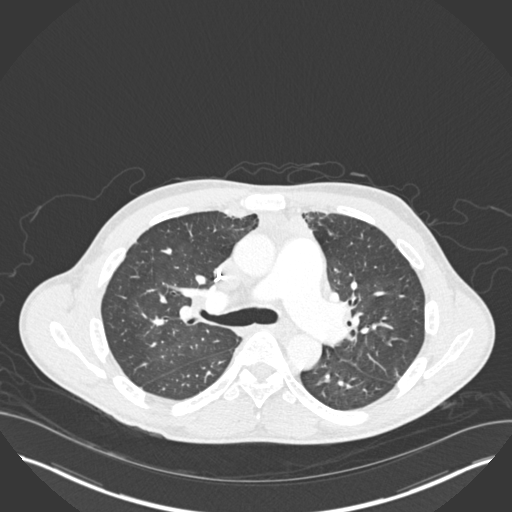
[im 162/266  soft-tissue]
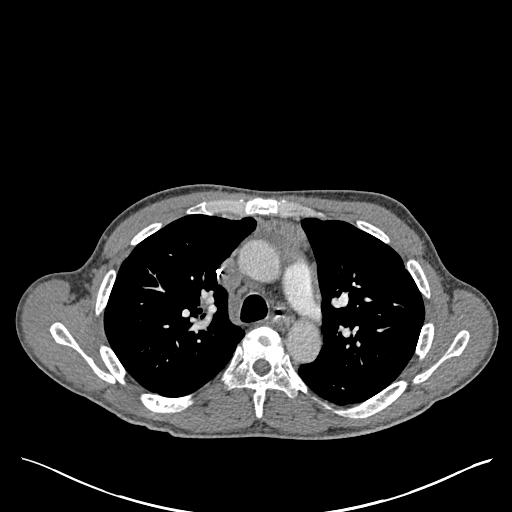
[im 185/266  lung]
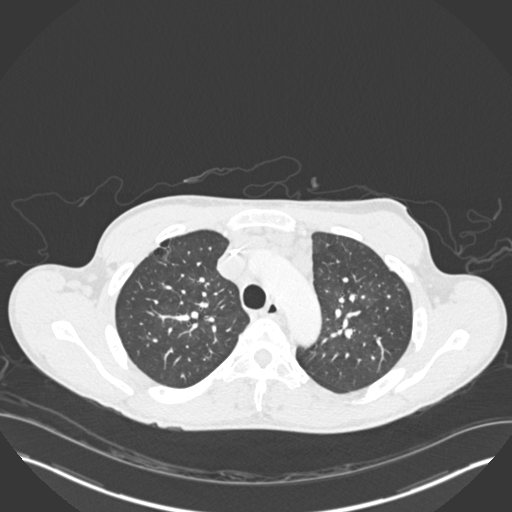
[im 196/266  soft-tissue]
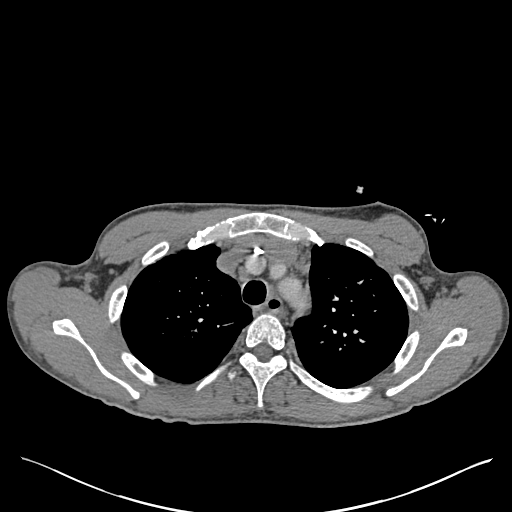
[im 219/266  lung]
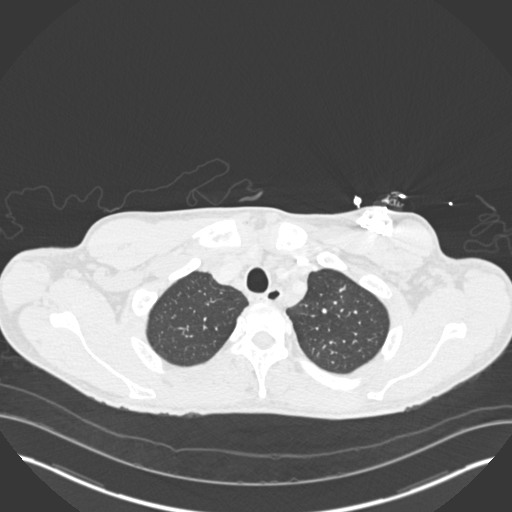
[im 231/266  soft-tissue]
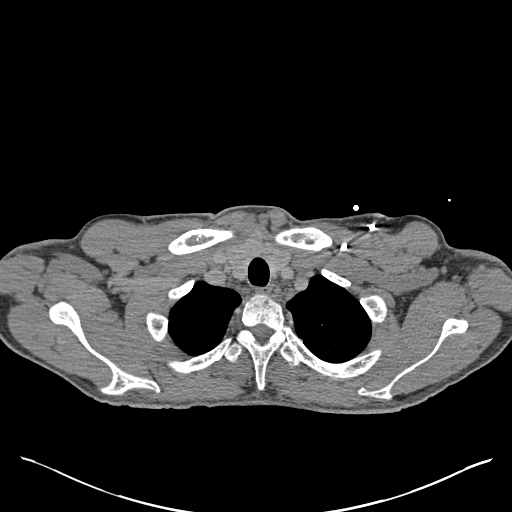
[im 254/266  lung]
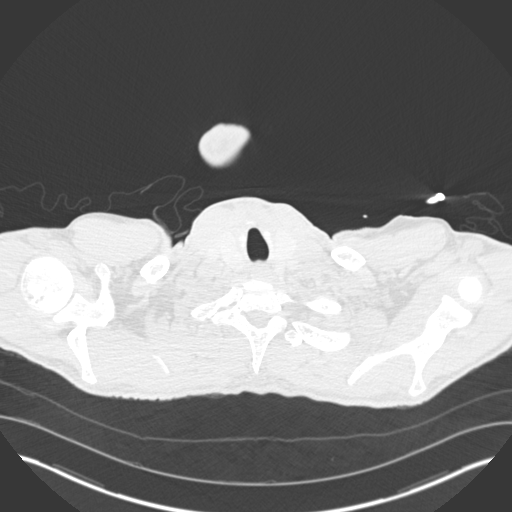

[Series 8: coronal mpr · coronal · 0.53mm/px · 3 of 121 slices shown]
[im 31/121  soft-tissue]
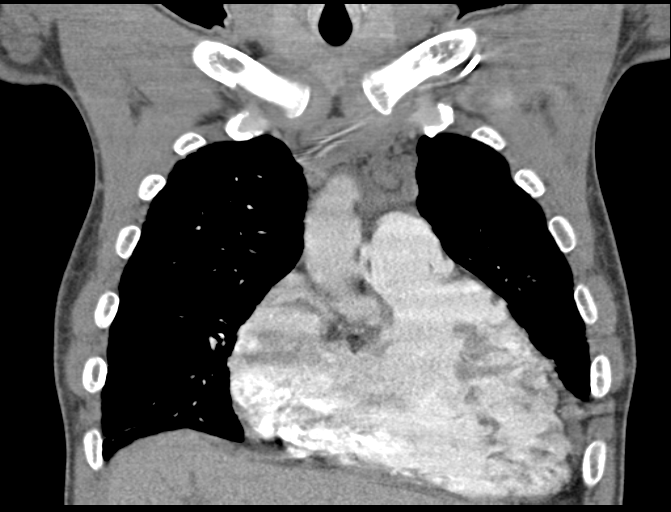
[im 61/121  soft-tissue]
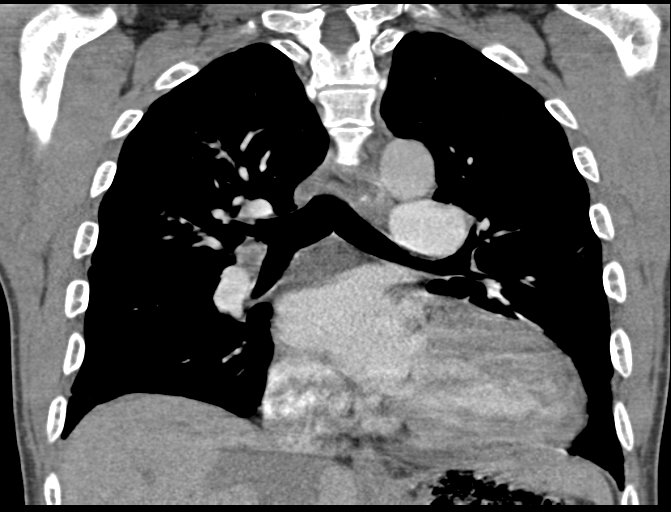
[im 91/121  soft-tissue]
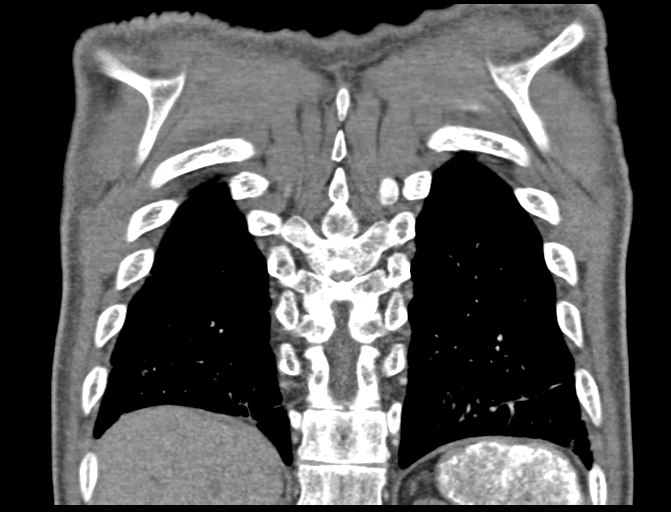

[18 of 46 positions shown; findings below may reference images not displayed]

FINDINGS: There is cardiomegaly. Aorta is normal caliber. Mildly enlarged
anterior mediastinal/prevascular lymph nodes. Index node has a short
axis diameter of 11 mm, stable. No hilar or axillary adenopathy.

Previously seen lower lobe pulmonary emboli not visualized on
today's study. For no confluent airspace opacity right that linear
areas of scarring in the lung bases. No effusions or confluent
airspace opacities.

Left chest wall Port-A-Cath remains in place, unchanged. Chest wall
soft tissues otherwise unremarkable. Imaging into the upper abdomen
shows no acute findings. Diffuse calcifications throughout the
spleen compatible with auto infarction.

No acute bony abnormality or focal bone lesion.

Review of the MIP images confirms the above findings.
IMPRESSION: Stable cardiomegaly and anterior mediastinal adenopathy.

Previously seen pulmonary emboli no longer visualized. Scarring in
the lung bases.

No acute findings.

## 2017-09-18 IMAGING — CR DG CHEST 2V
2 series · 2 of 2 positions shown · non-contrast
Comparison: 04/12/2014

CLINICAL DATA: Chronic sickle cell pain

EXAM:
CHEST  2 VIEW

[w chest pa]
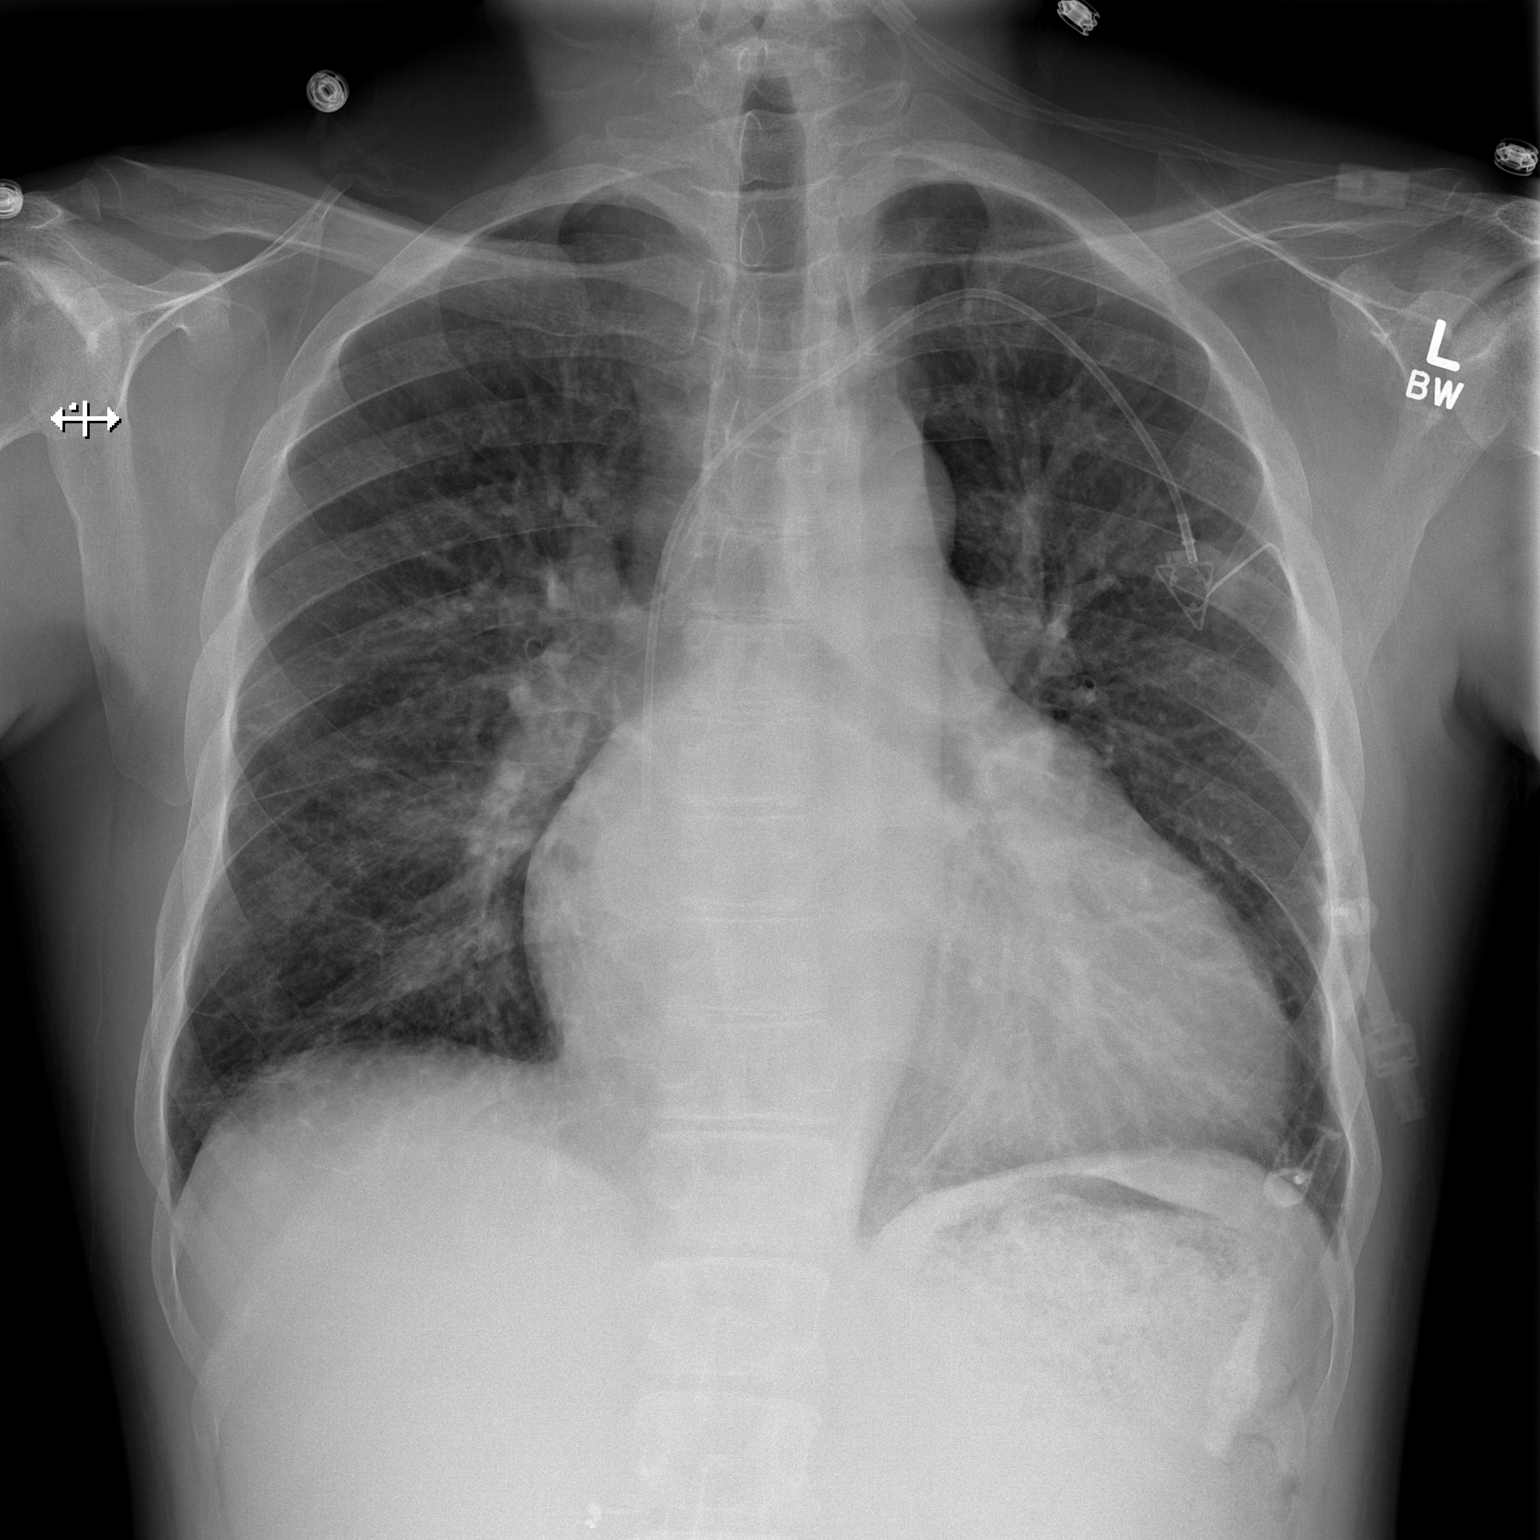

[w chest lat]
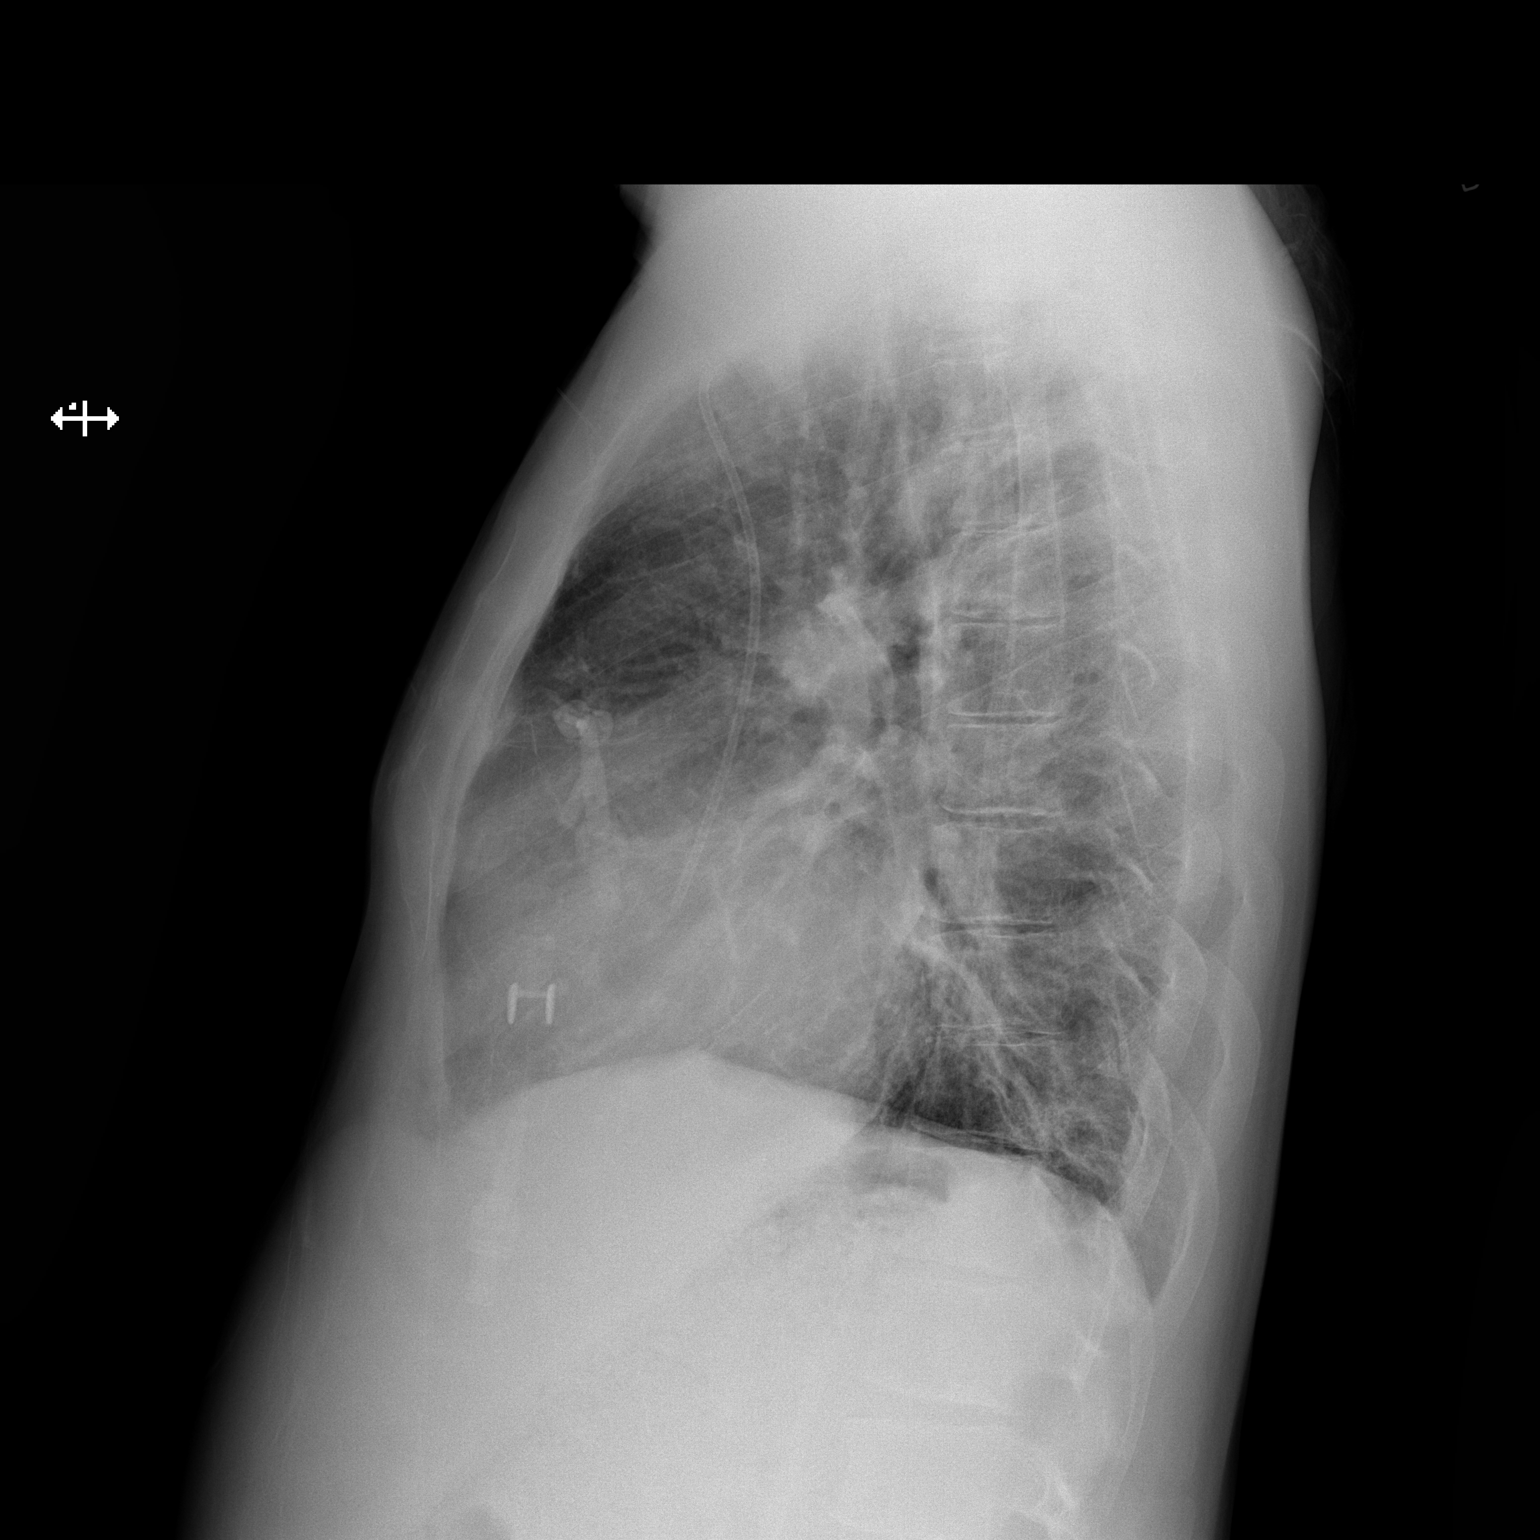

[2 of 2 positions shown; findings below may reference images not displayed]

FINDINGS: Cardiomegaly.  No frank interstitial edema.

Mild patchy right basilar/right lower lobe opacity, atelectasis
versus pneumonia.

No pleural effusion or pneumothorax.

Left chest power port terminating at the cavoatrial junction.
IMPRESSION: Mild patchy right lower lobe opacity, atelectasis versus pneumonia.

Cardiomegaly.  No frank interstitial edema.

## 2017-10-06 IMAGING — CR DG CHEST 2V
2 series · 2 of 2 positions shown · non-contrast
Comparison: 10/26/2014

CLINICAL DATA: Sickle cell pain crisis with left lateral chest wall
pain

EXAM:
CHEST  2 VIEW

[w chest pa]
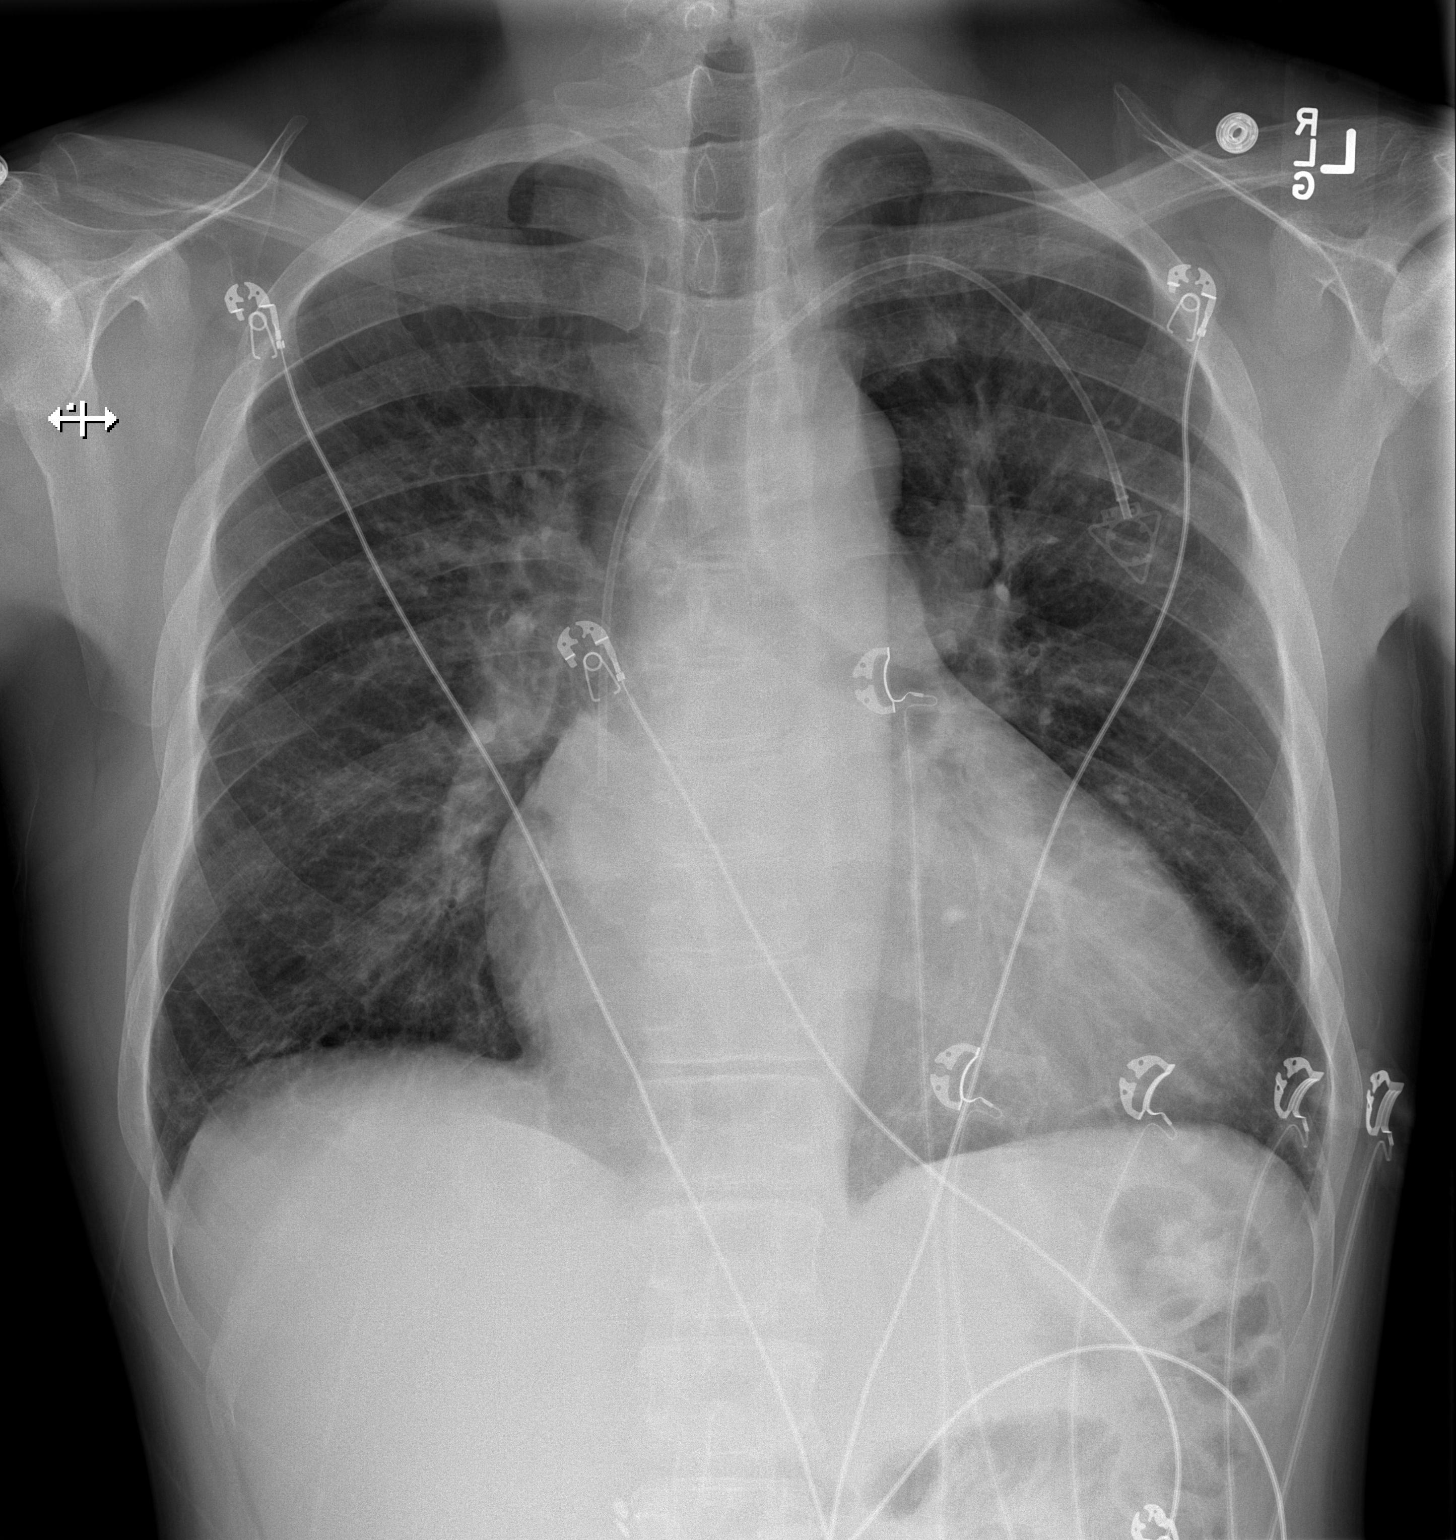

[w chest lat]
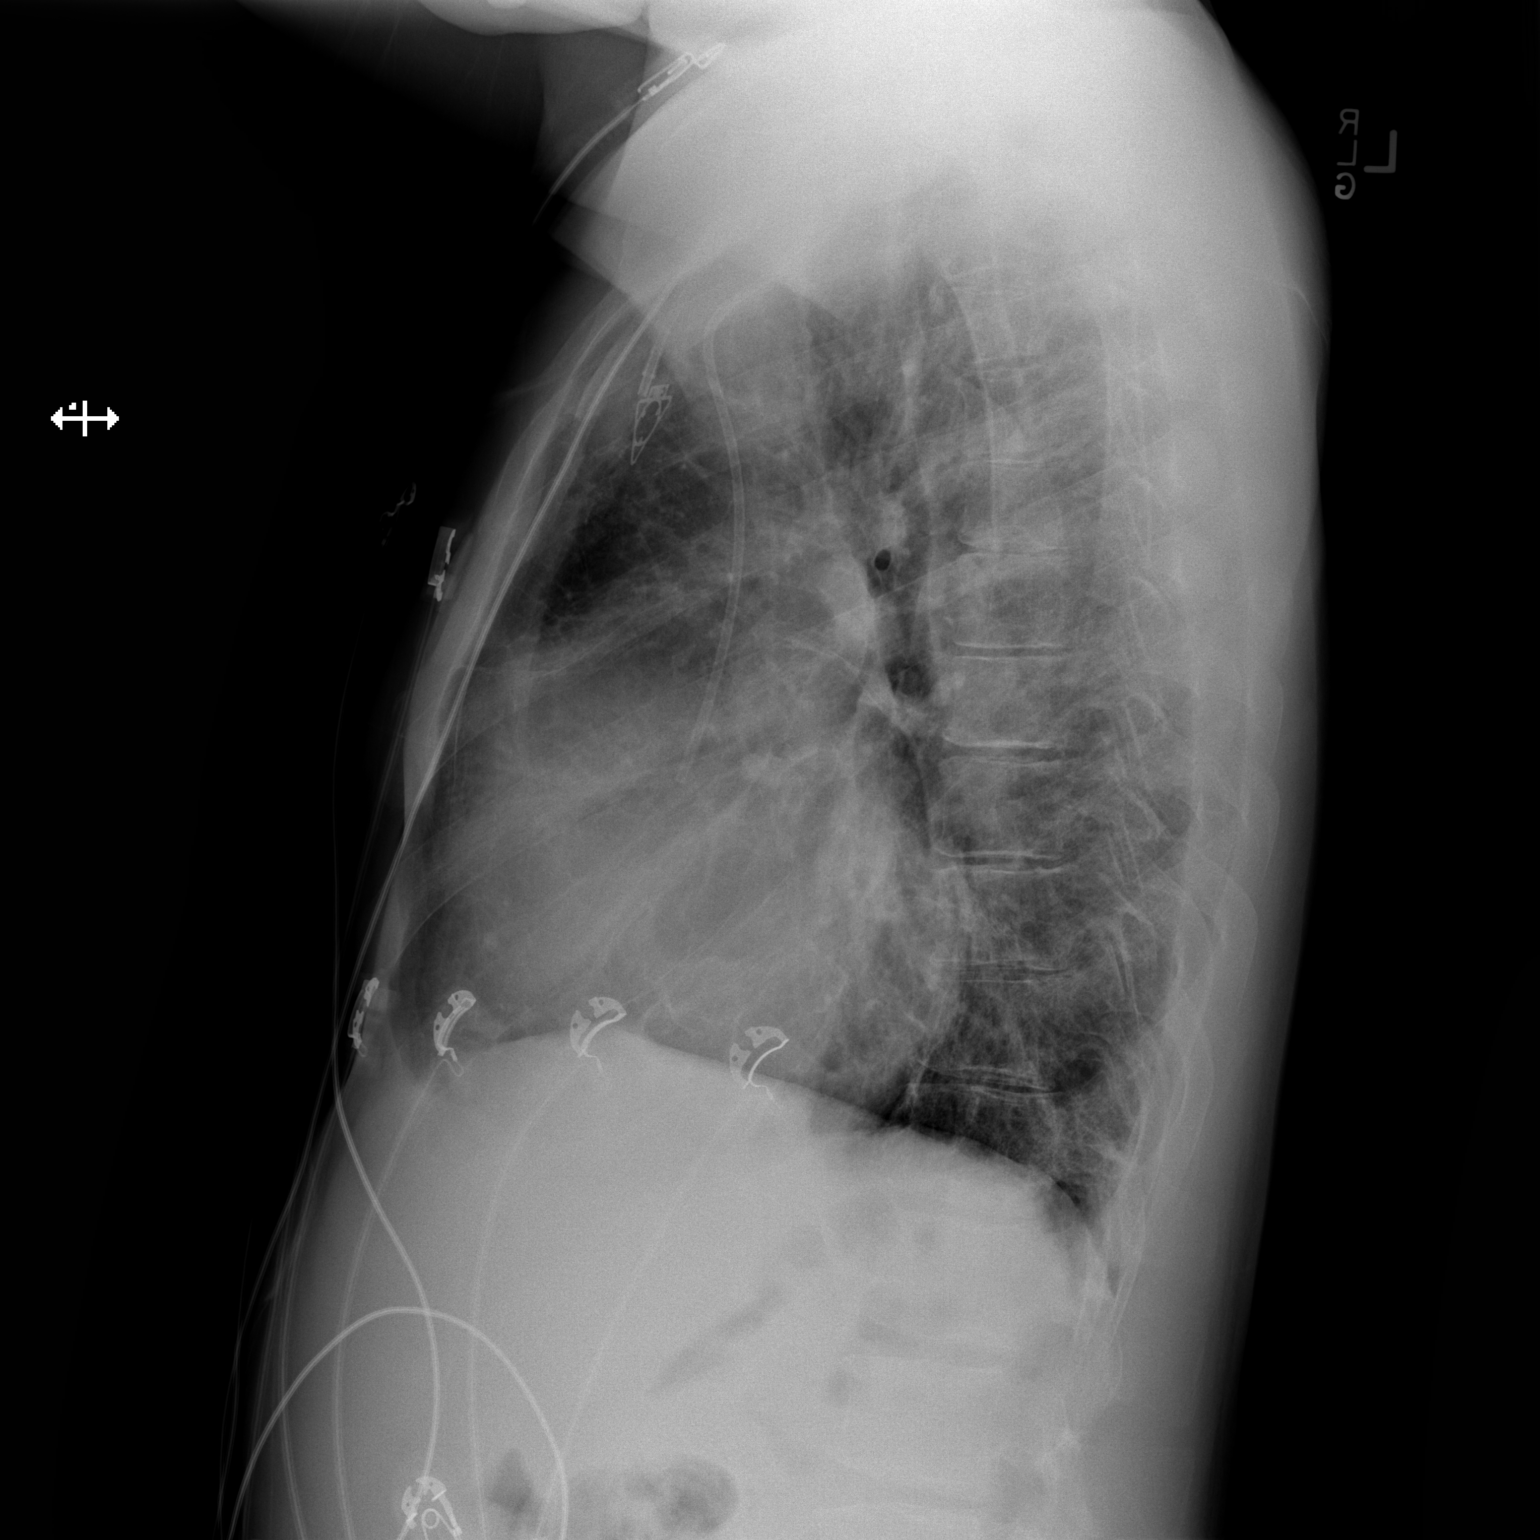

[2 of 2 positions shown; findings below may reference images not displayed]

FINDINGS: Chronic cardiomegaly. Negative aortic and hilar contours. Stable
left subclavian porta catheter placement, tip at the upper right
atrium. Chronic interstitial coarsening consistent with scar. There
is no edema, consolidation, effusion, or pneumothorax. No osseous
findings to explain acute pain. Chronic humeral head osteonecrosis.
Cholecystectomy.
IMPRESSION: Stable lung scarring and cardiomegaly. No evidence of acute
cardiopulmonary disease.

## 2017-10-06 IMAGING — CT CT ANGIO CHEST
2 of 6 series · 18 of 36 positions shown · IV contrast (OMNIPAQUE 350)
Comparison: Chest CT October 26, 2014 and November 13, 2014 chest
radiograph

CLINICAL DATA: Chest pain.  Sickle cell disease

EXAM:
CT ANGIOGRAPHY CHEST WITH CONTRAST
TECHNIQUE: Multidetector CT imaging of the chest was performed using the
standard protocol during bolus administration of intravenous
contrast. Multiplanar CT image reconstructions and MIPs were
obtained to evaluate the vascular anatomy.
CONTRAST:  100mL OMNIPAQUE IOHEXOL 350 MG/ML SOLN

[Series 6: thins for pacs · axial · 0.74mm/px · z∈[-82,+174]mm · 17 of 286 slices shown]
[im 15/286  lung]
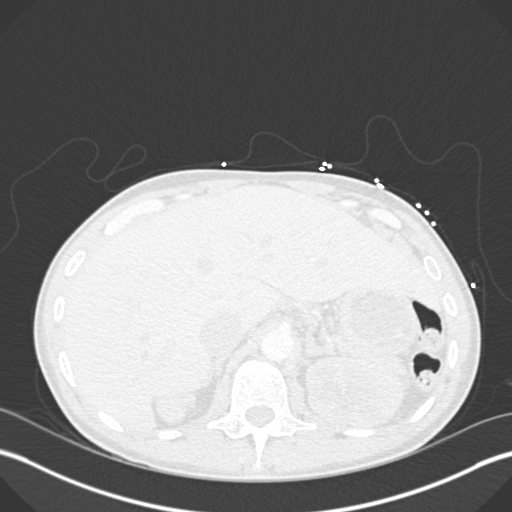
[im 29/286  mediastinal]
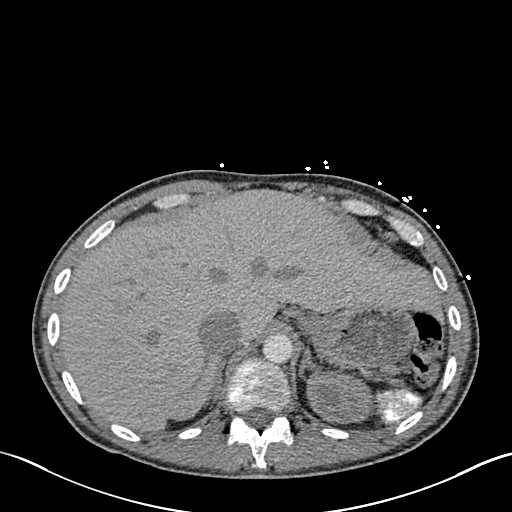
[im 43/286  lung]
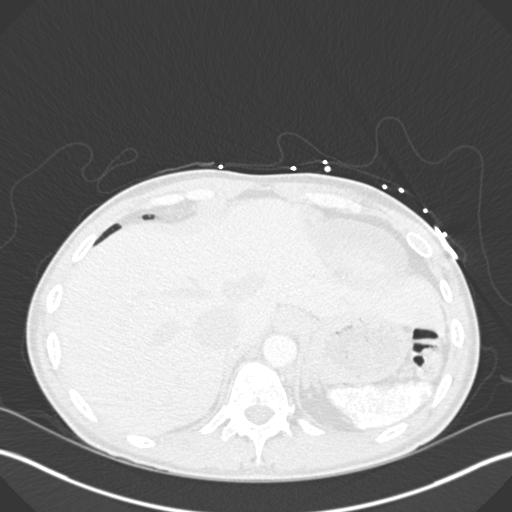
[im 58/286  mediastinal]
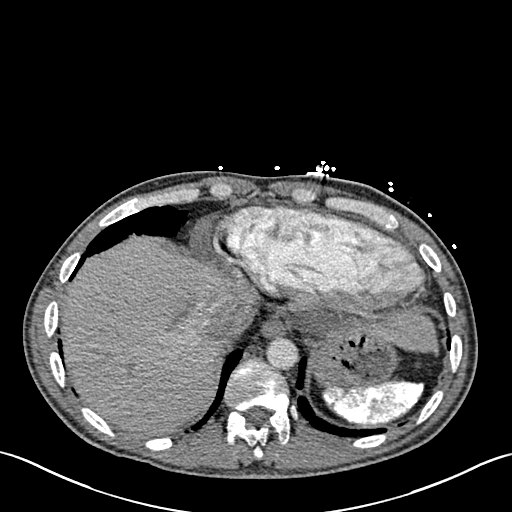
[im 86/286  lung]
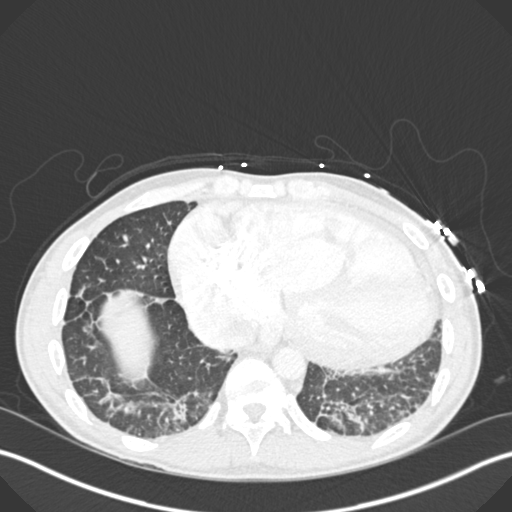
[im 100/286  mediastinal]
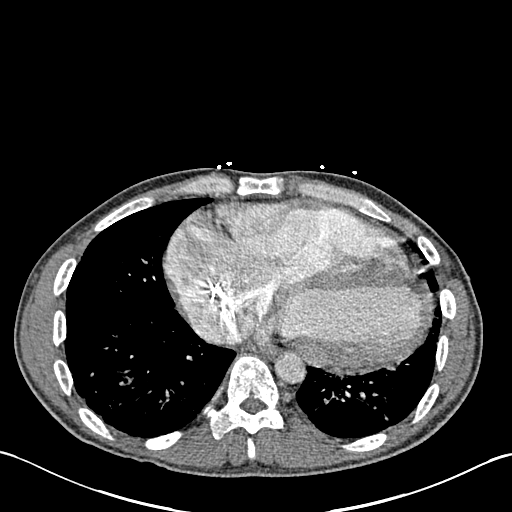
[im 115/286  lung]
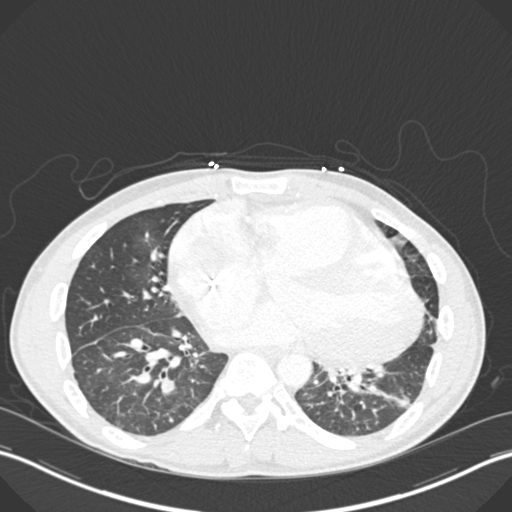
[im 129/286  mediastinal]
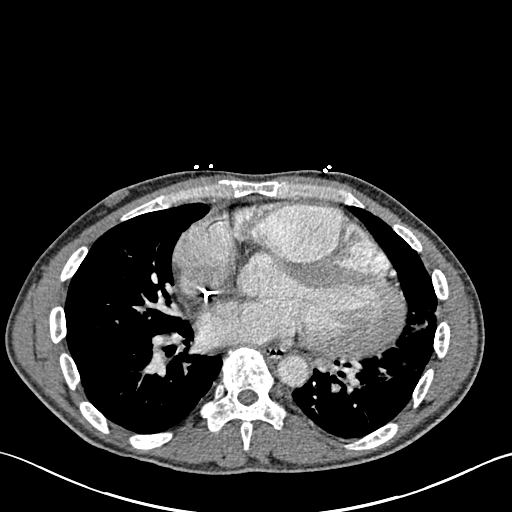
[im 143/286  lung]
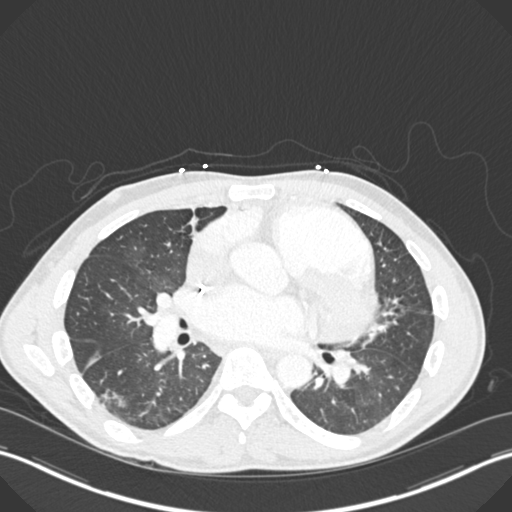
[im 157/286  mediastinal]
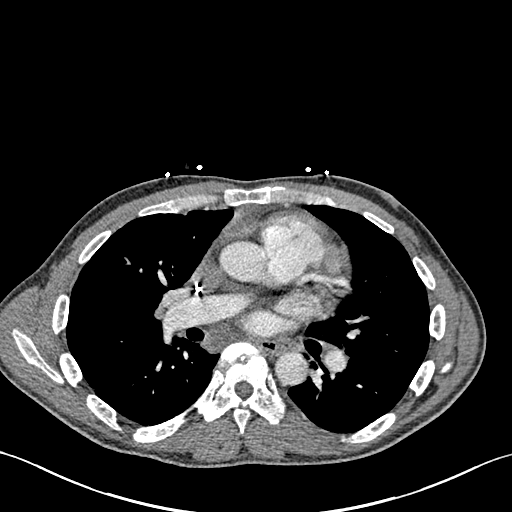
[im 172/286  lung]
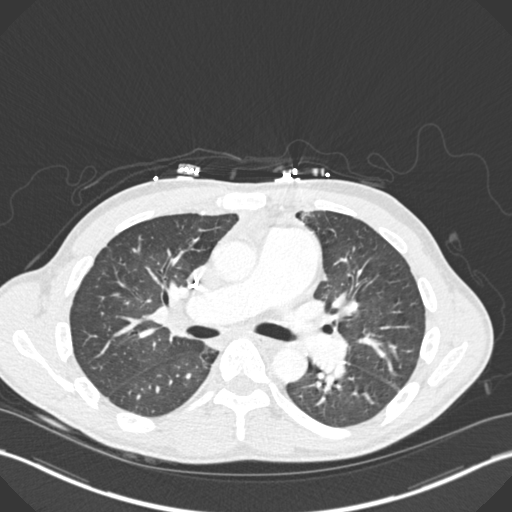
[im 186/286  mediastinal]
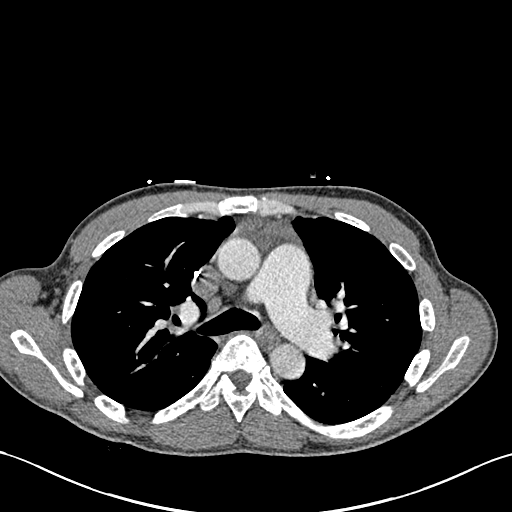
[im 200/286  lung]
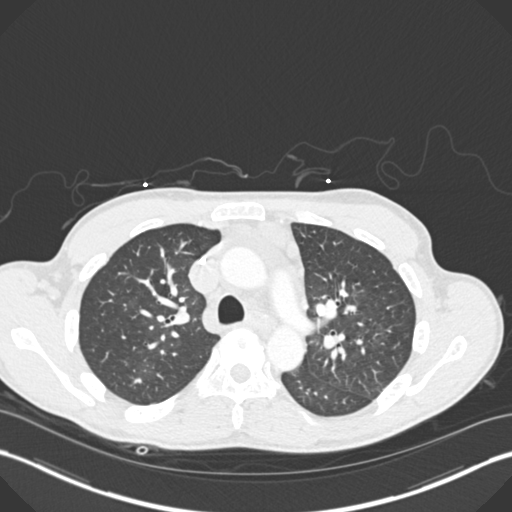
[im 229/286  mediastinal]
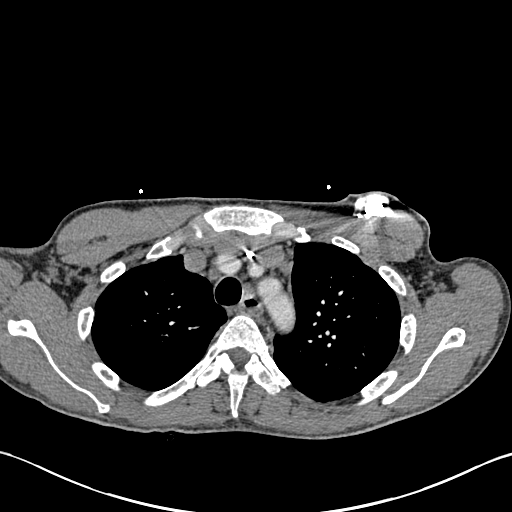
[im 243/286  lung]
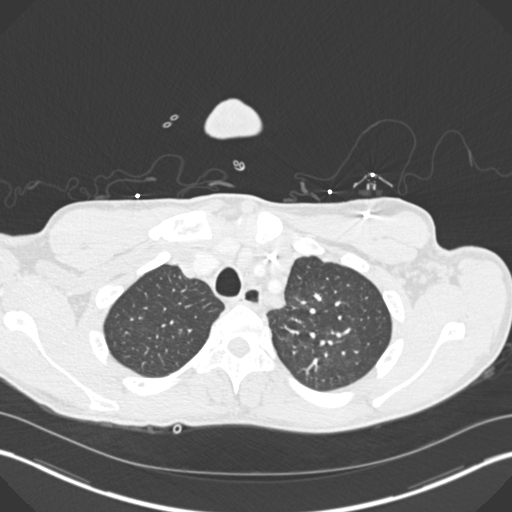
[im 257/286  mediastinal]
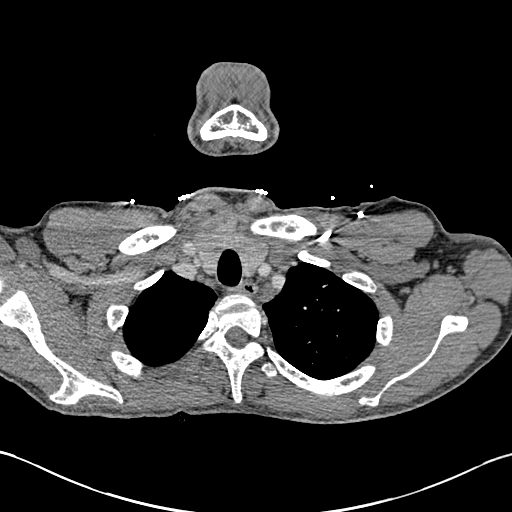
[im 271/286  lung]
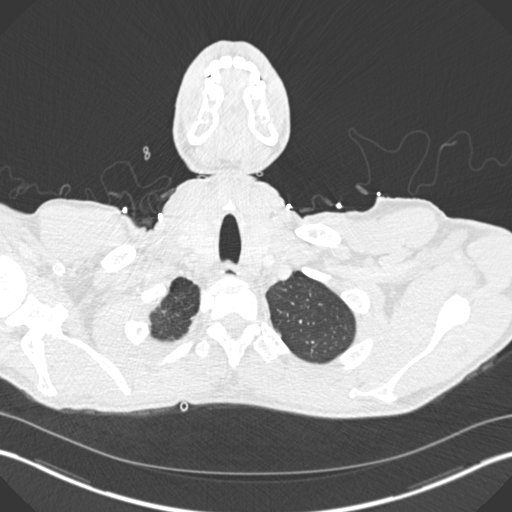

[Series 8: coronal mpr · coronal · 0.63mm/px · 1 of 125 slices shown]
[im 63/125  mediastinal]
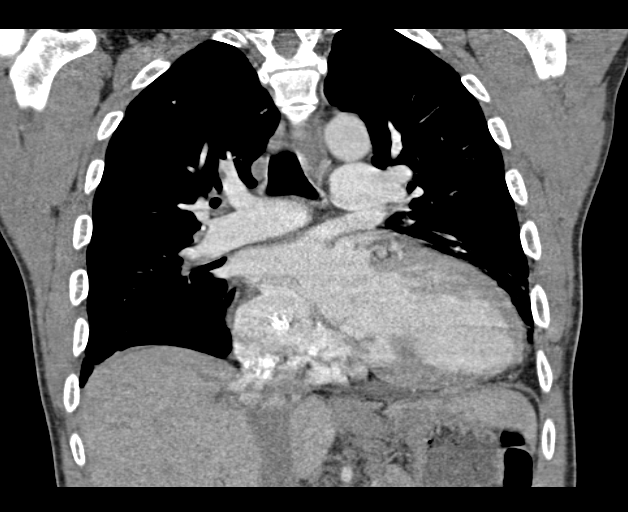

[18 of 36 positions shown; findings below may reference images not displayed]

FINDINGS: There is a small pulmonary embolus in the posterior segment right
lower lobe pulmonary artery. No more central pulmonary emboli are
identified. No evidence of right heart strain. There is no thoracic
aortic aneurysm or dissection. The visualized great vessels appear
normal.

There is a small area of patchy infiltrate in the anterior segment
of the left upper lobe, stable. There is patchy atelectatic change
in both bases, stable. No new opacity is appreciable.

Cardiomegaly is stable.  The pericardium is not thickened.

Visualized thyroid appears normal. Anterior mediastinal adenopathy
remains, stable. The largest individual lymph node in the anterior
mediastinum measures 2.3 x 1.5 cm, unchanged. There is a single
prominent lymph node in the right hilum measuring 1.6 x 1.2 cm.
There is no new adenopathy. Central catheter tip is near the
cavoatrial junction.

In the visualized upper abdomen, there is diffuse calcification of
the spleen consistent with a degree of autoinfarction, stable.

There are multiple thoracic spine end plate infarcts consistent with
known sickle cell disease. Bones overall are sclerotic consistent
with the chronic anemia.

There is a probable sebaceous cyst measuring 2.1 x 1.1 cm in the
right anterior chest wall, stable.

Review of the MIP images confirms the above findings.
IMPRESSION: Small pulmonary embolus in a posterior segment right lower lobe
pulmonary artery branch, not present recently and presumed new. No
other pulmonary emboli identified.

Stable cardiomegaly and anterior mediastinal adenopathy. Single
prominent lymph node in the right hilum. Stable areas of patchy
lower lobe atelectasis and mild patchy infiltrate in the left upper
lobe. No new parenchymal lung opacity.

Stable bony changes consistent with sickle cell anemia. Evidence of
prior splenic autoinfarction.

Critical Value/emergent results were called by telephone at the time
of interpretation on 11/13/2014 at [DATE] to Dr. CLARISA
BURKHALTER , who verbally acknowledged these results.

## 2017-10-08 IMAGING — CR DG PELVIS 1-2V
1 series · 1 of 1 positions shown · non-contrast
Comparison: CT right hip dated 10/27/2013

CLINICAL DATA: Sickle cell pain crisis

EXAM:
PELVIS - 1-2 VIEW

[t pelvis ap]
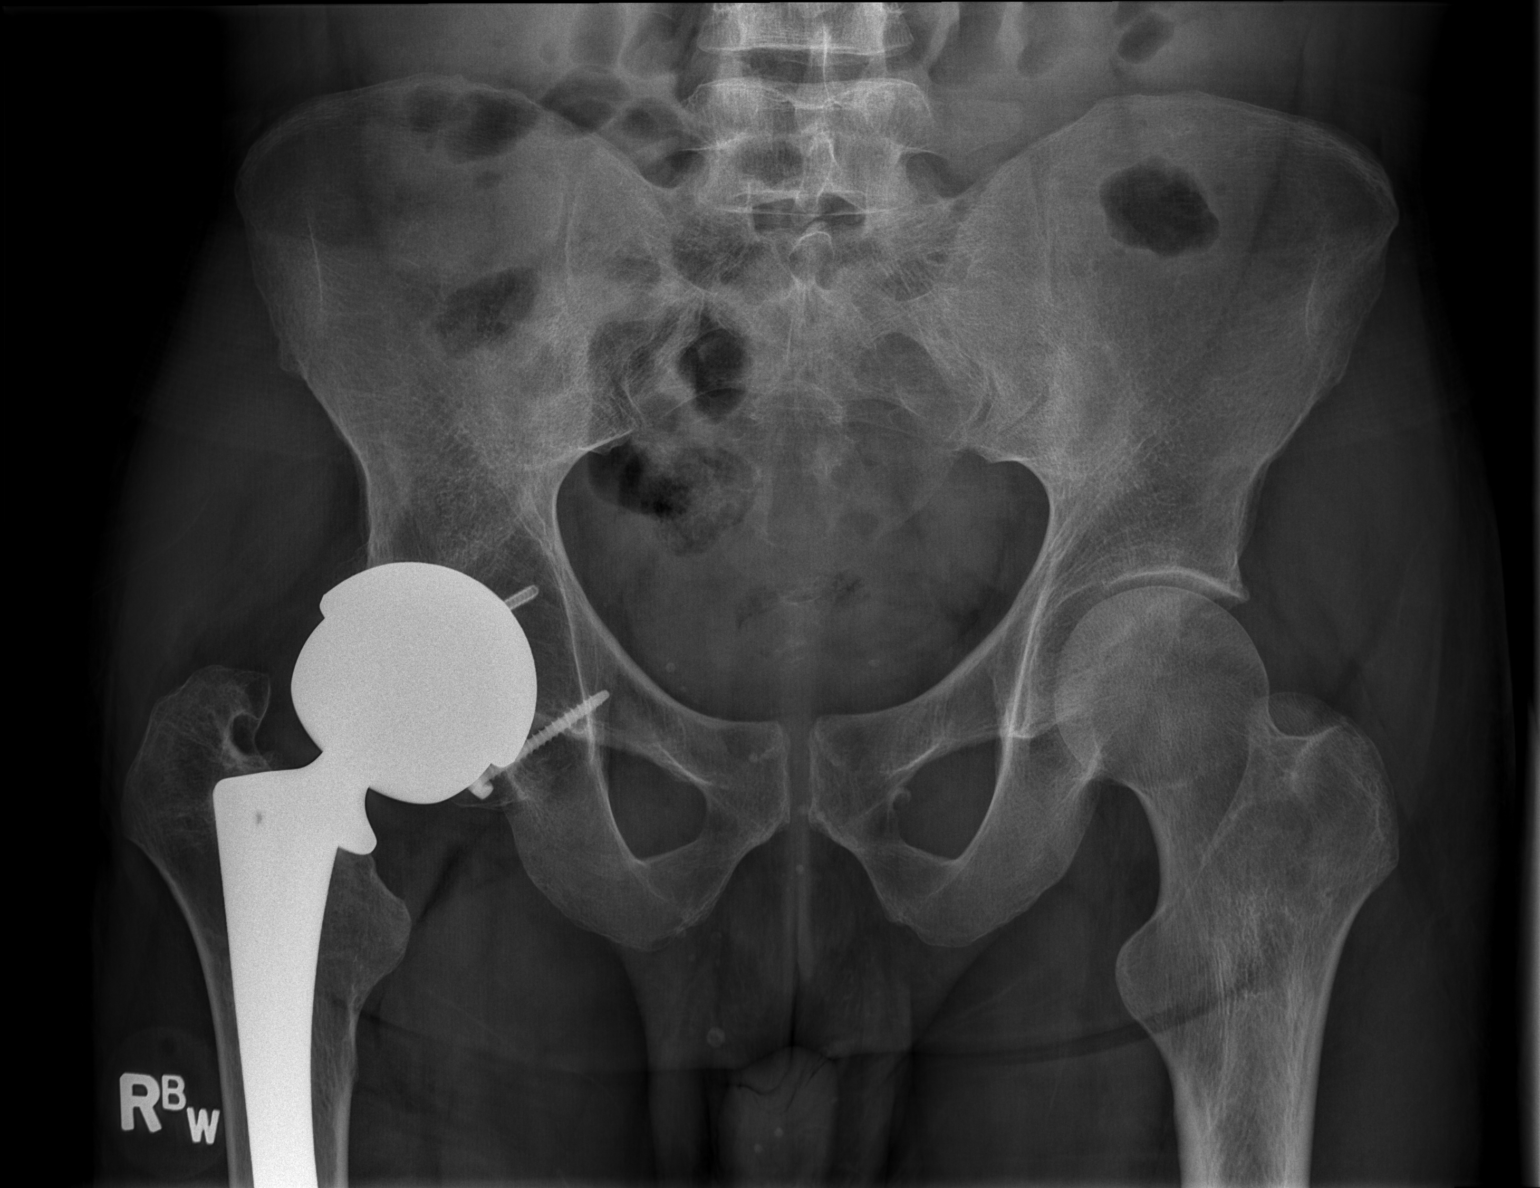

[1 of 1 positions shown; findings below may reference images not displayed]

FINDINGS: Right hip arthroplasty in satisfactory position.

No fracture or dislocation is seen.

Left hip joint space is preserved.

Visualized bony pelvis appears intact.
IMPRESSION: Right hip arthroplasty satisfactory position.

No fracture or dislocation is seen.

## 2017-10-08 IMAGING — CR DG LUMBAR SPINE COMPLETE 4+V
5 series · 5 of 5 positions shown · non-contrast
Comparison: None.

CLINICAL DATA: Sickle cell pain crisis

EXAM:
LUMBAR SPINE - COMPLETE 4+ VIEW

[t lumbar spine ap]
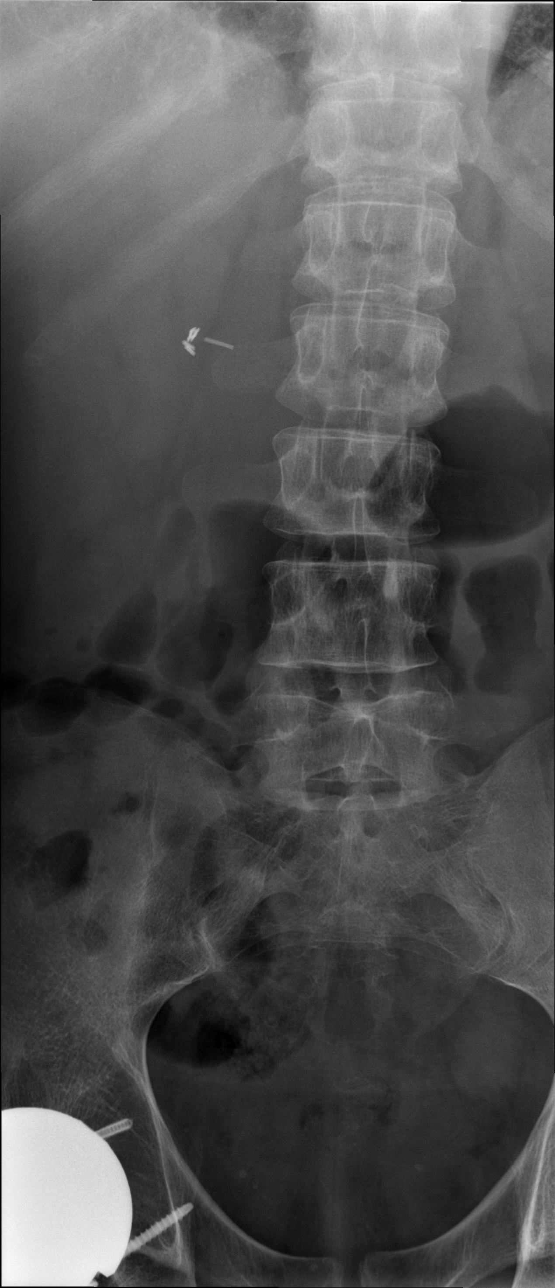

[t lumbar spine obl (1 of 2)]
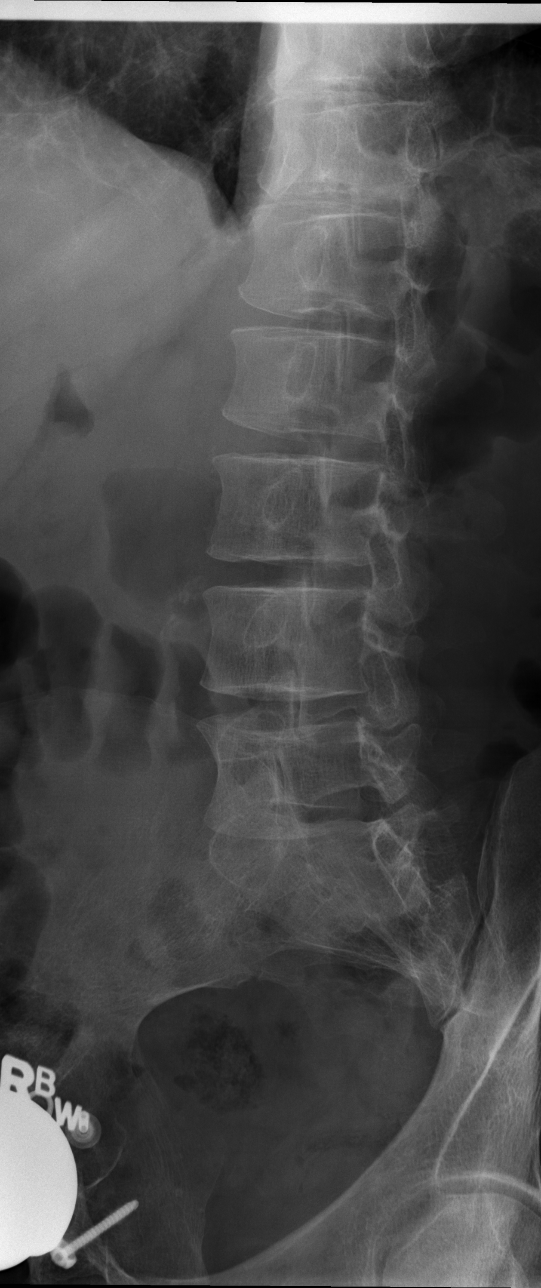

[t lumbar spine obl (2 of 2)]
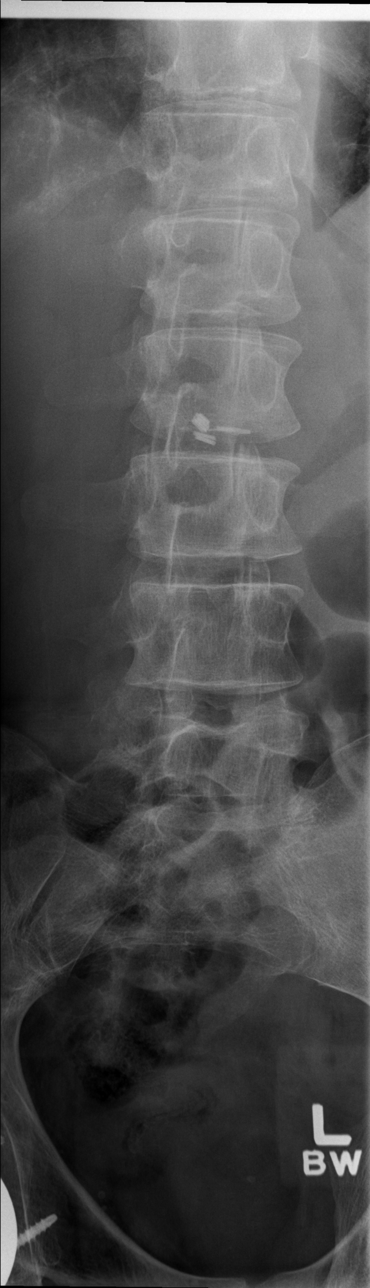

[t lumbar spine lat]
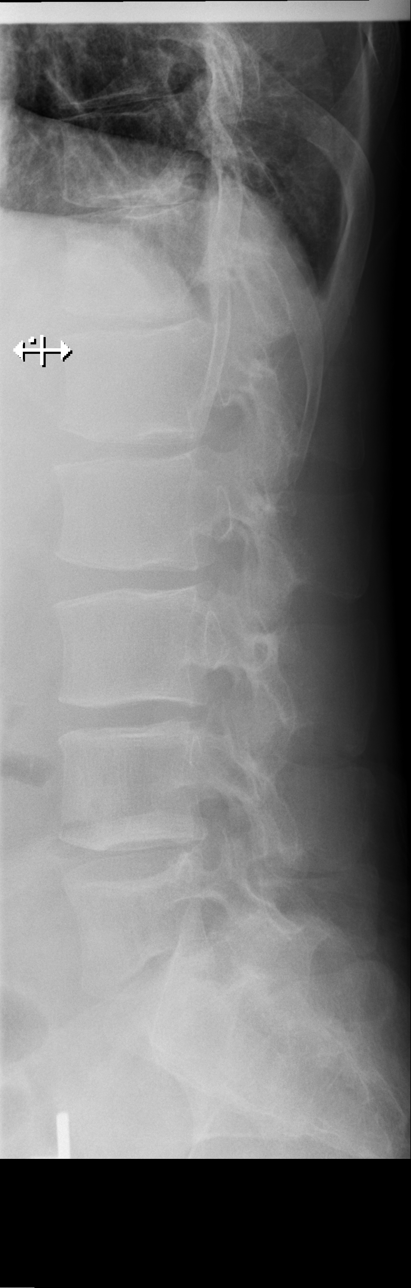

[t lumbar l-5 s-1 spot]
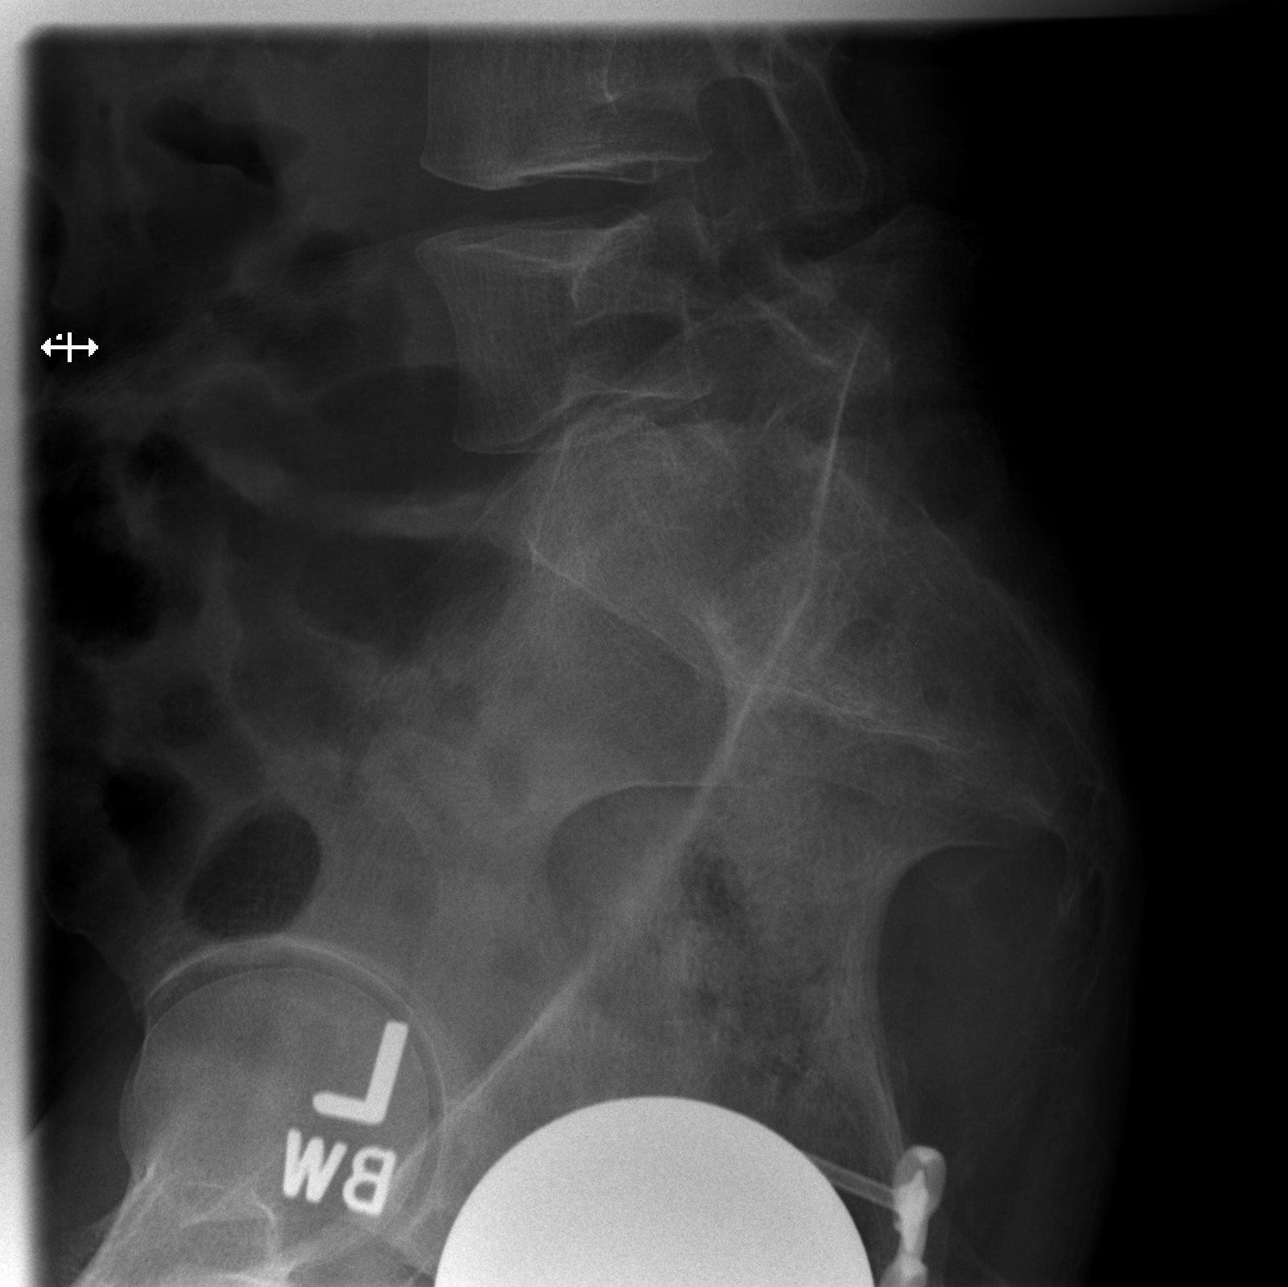

[5 of 5 positions shown; findings below may reference images not displayed]

FINDINGS: Five lumbar type vertebral bodies.

Straightening of the lumbar spine.

No evidence of fracture or dislocation. Vertebral body heights are
maintained.

Mild superior/ inferior endplate changes at L5 characteristic of
sickle cell.

Visualized bony pelvis appears intact.
IMPRESSION: No fracture or dislocation is seen.

## 2017-10-08 IMAGING — CR DG CHEST 2V
2 series · 2 of 2 positions shown · non-contrast
Comparison: 11/13/2014

CLINICAL DATA: Back pain.  Sickle cell anemia.

EXAM:
CHEST  2 VIEW

[w chest pa]
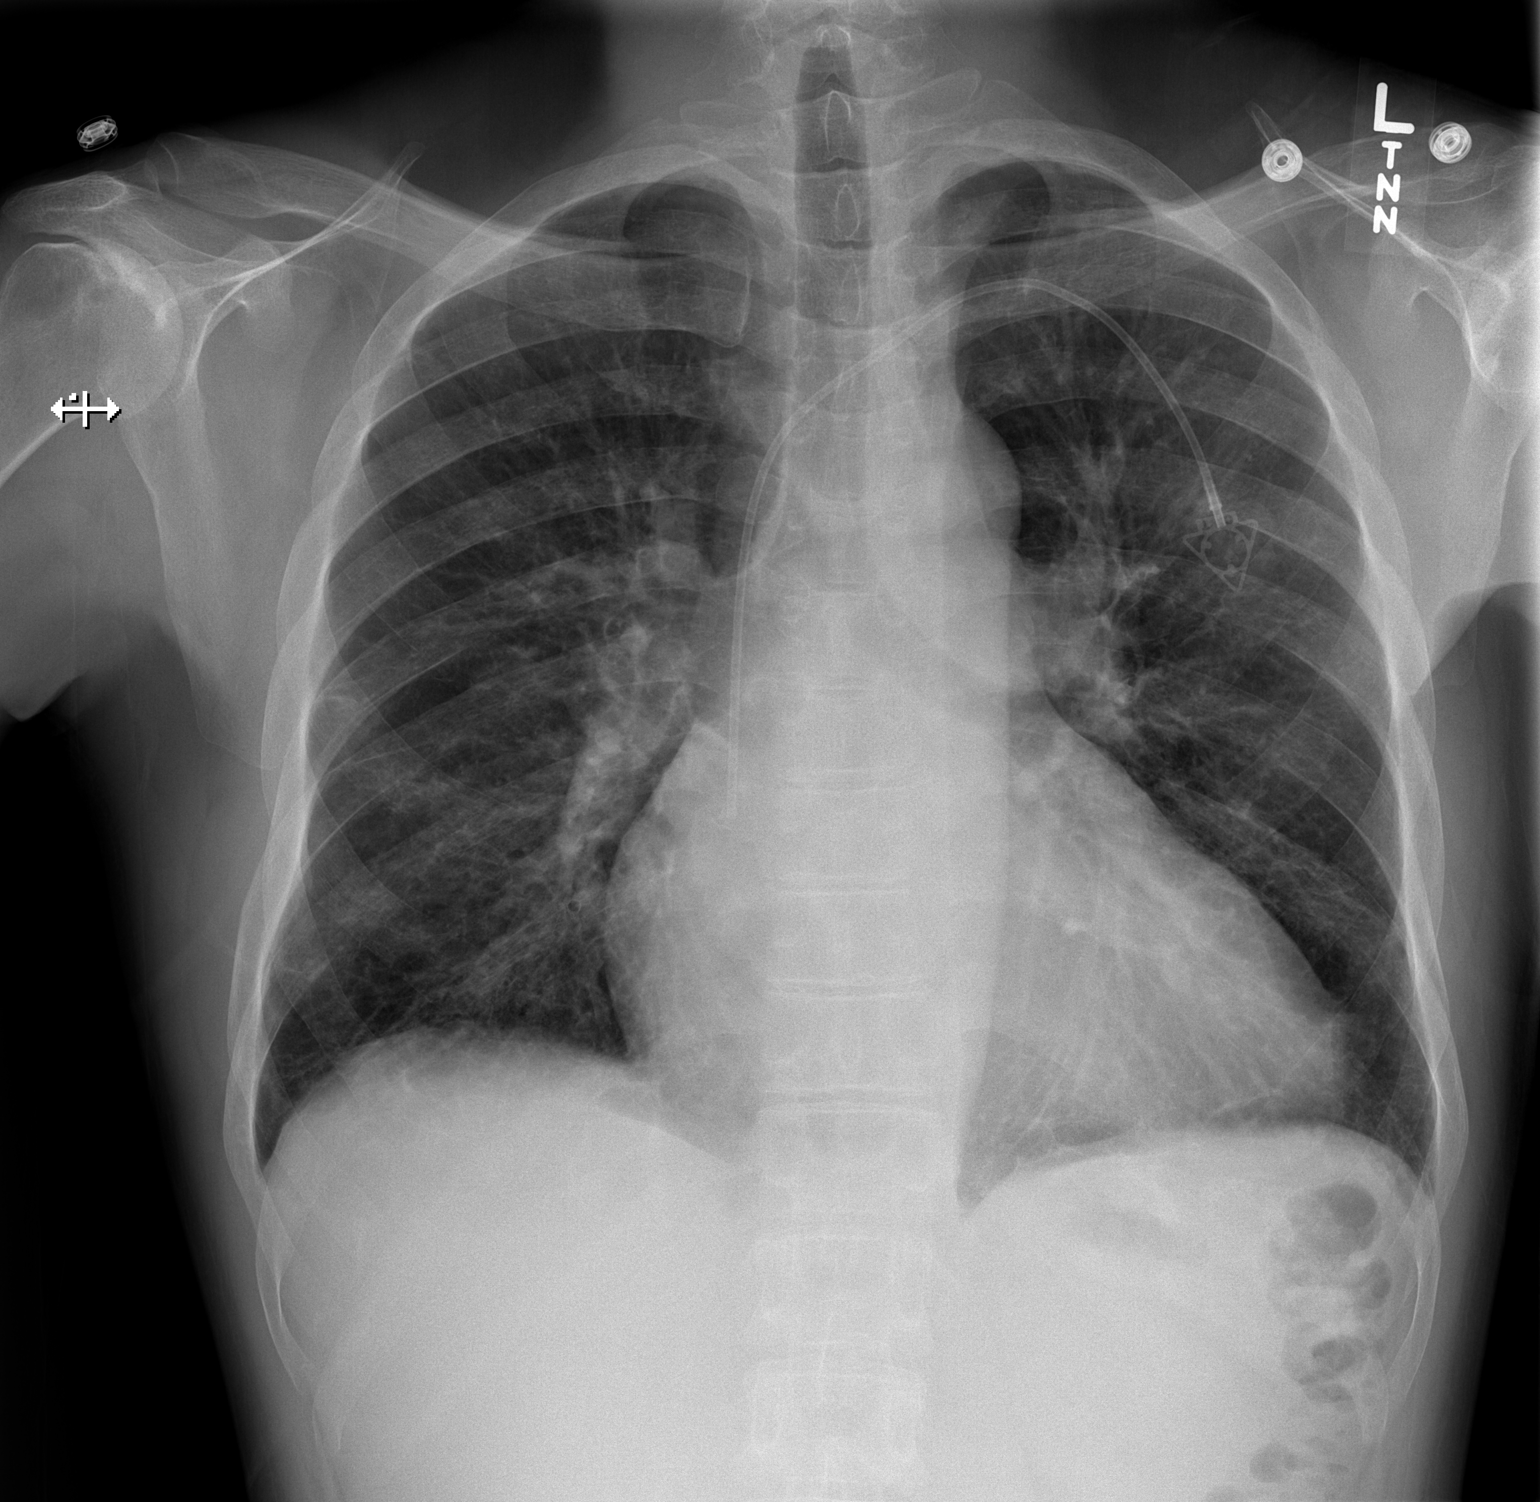

[w chest lat]
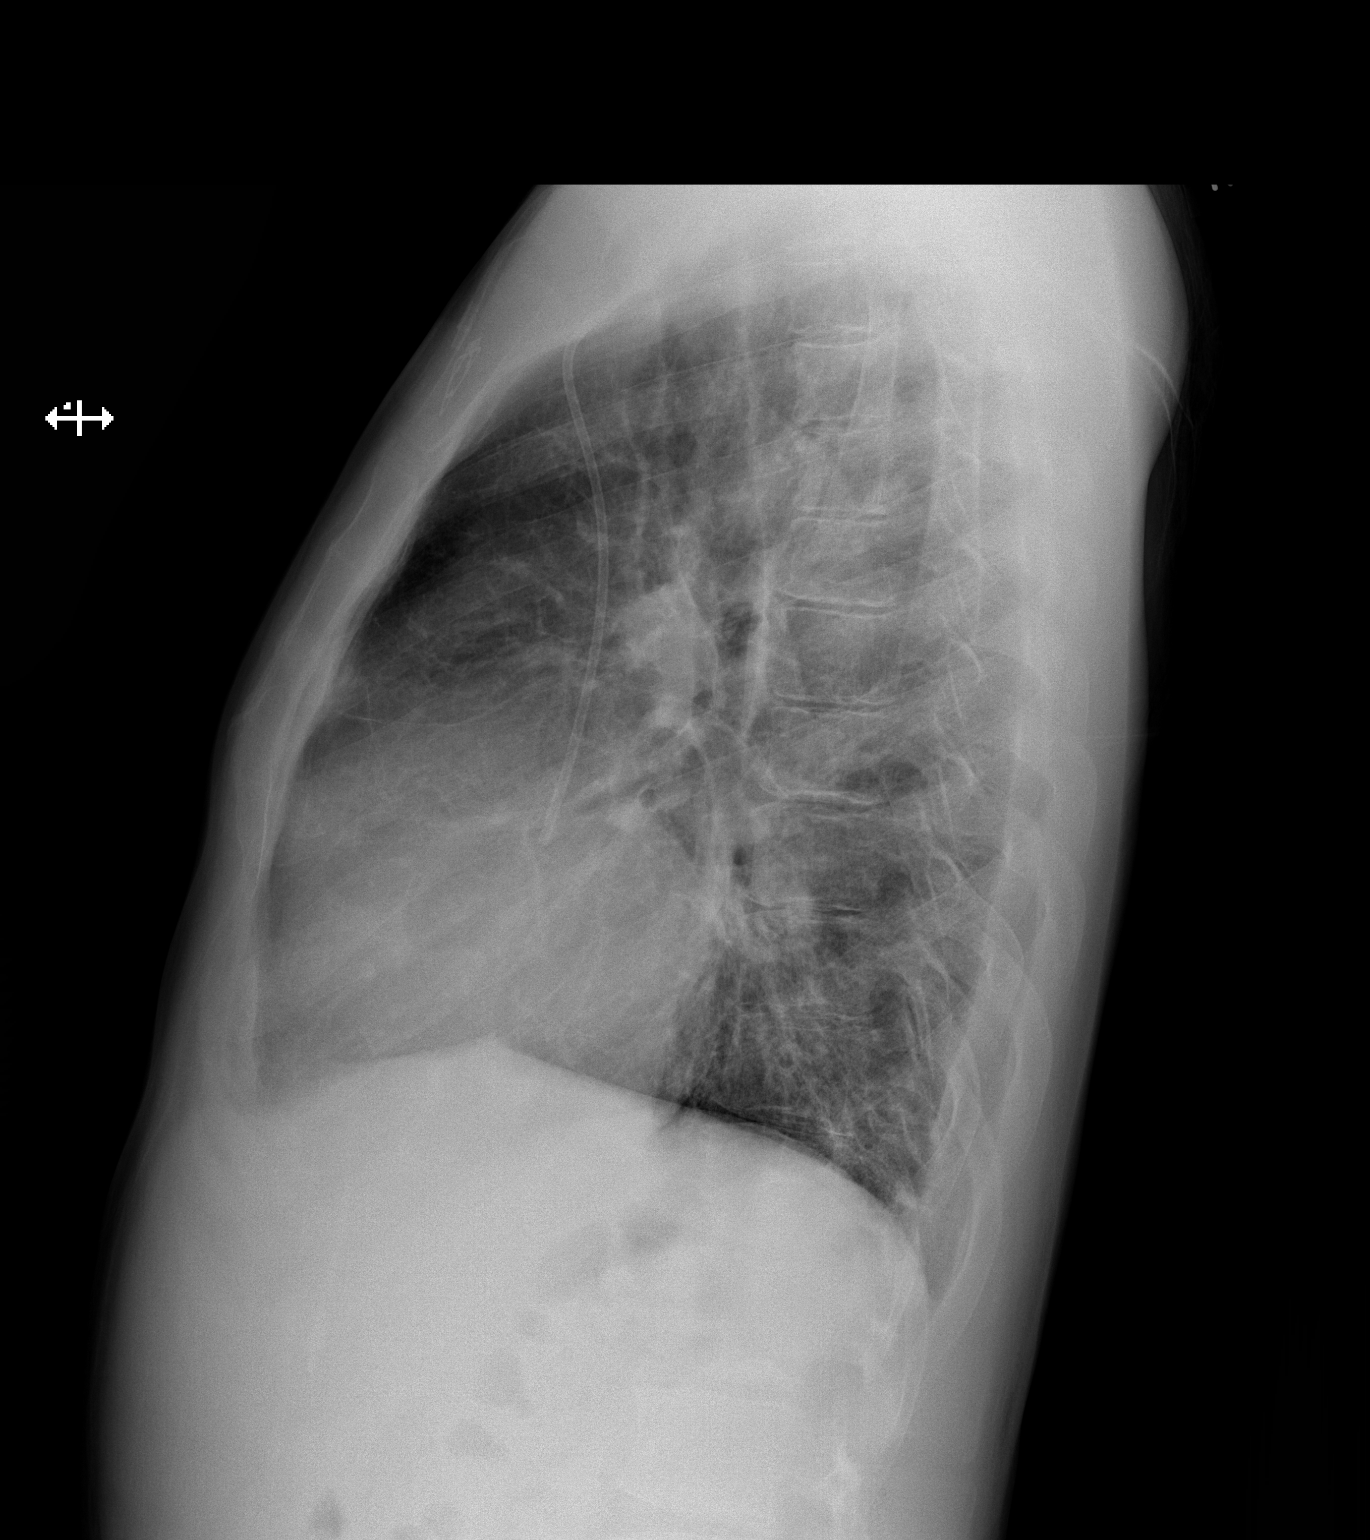

[2 of 2 positions shown; findings below may reference images not displayed]

FINDINGS: Cardiomegaly. Left subclavian vein Port-A-Cath is stable. No
pneumothorax. Chronic changes at the lung bases.
IMPRESSION: No active cardiopulmonary disease.

## 2017-10-18 IMAGING — CR DG CHEST 1V PORT
1 series · 1 of 1 positions shown · non-contrast
Comparison: Chest x-ray 11/15/2014 and earlier. CTA chest
11/13/2014. CT chest 10/26/2014 and earlier.

CLINICAL DATA: Current history of sickle cell disease, presenting
with hypoxia. Recent diagnosis of pulmonary embolus.

EXAM:
PORTABLE CHEST 1 VIEW

[AP]
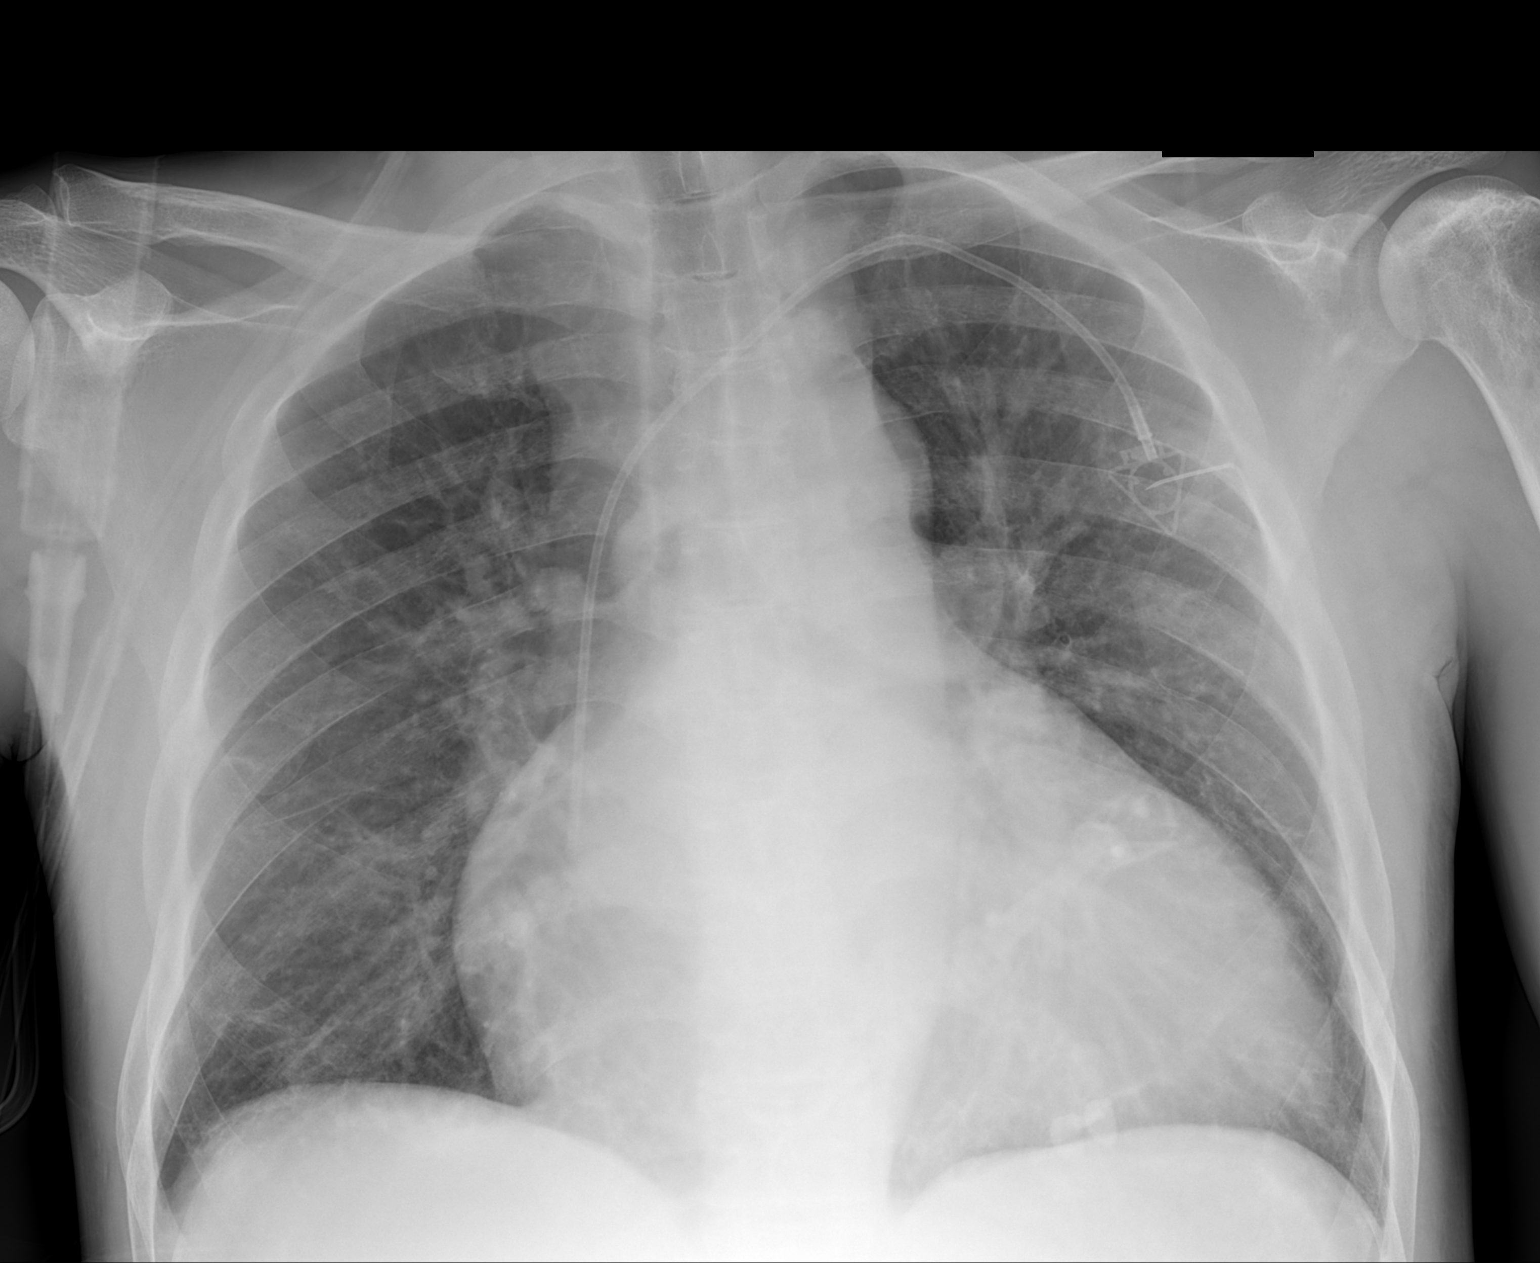

[1 of 1 positions shown; findings below may reference images not displayed]

FINDINGS: Cardiac silhouette massively enlarged, unchanged. Widening of the
superior mediastinum, unchanged, shown on prior CT to be due to
anterior mediastinal lymphadenopathy. Stable scarring in the lung
bases. Lungs otherwise clear. No localized airspace consolidation.
No pleural effusions. No pneumothorax. Normal pulmonary vascularity.
Left subclavian Port-A-Cath tip projects at the cavoatrial junction,
unchanged.
IMPRESSION: Stable massive cardiomegaly.  No acute cardiopulmonary disease.

## 2017-10-19 IMAGING — DX DG CHEST 2V
2 series · 2 of 2 positions shown · non-contrast
Comparison: Chest x-ray of November 25, 2014

CLINICAL DATA: Sickle cell crisis, shortness of breath.

EXAM:
CHEST  2 VIEW

[chest pa]
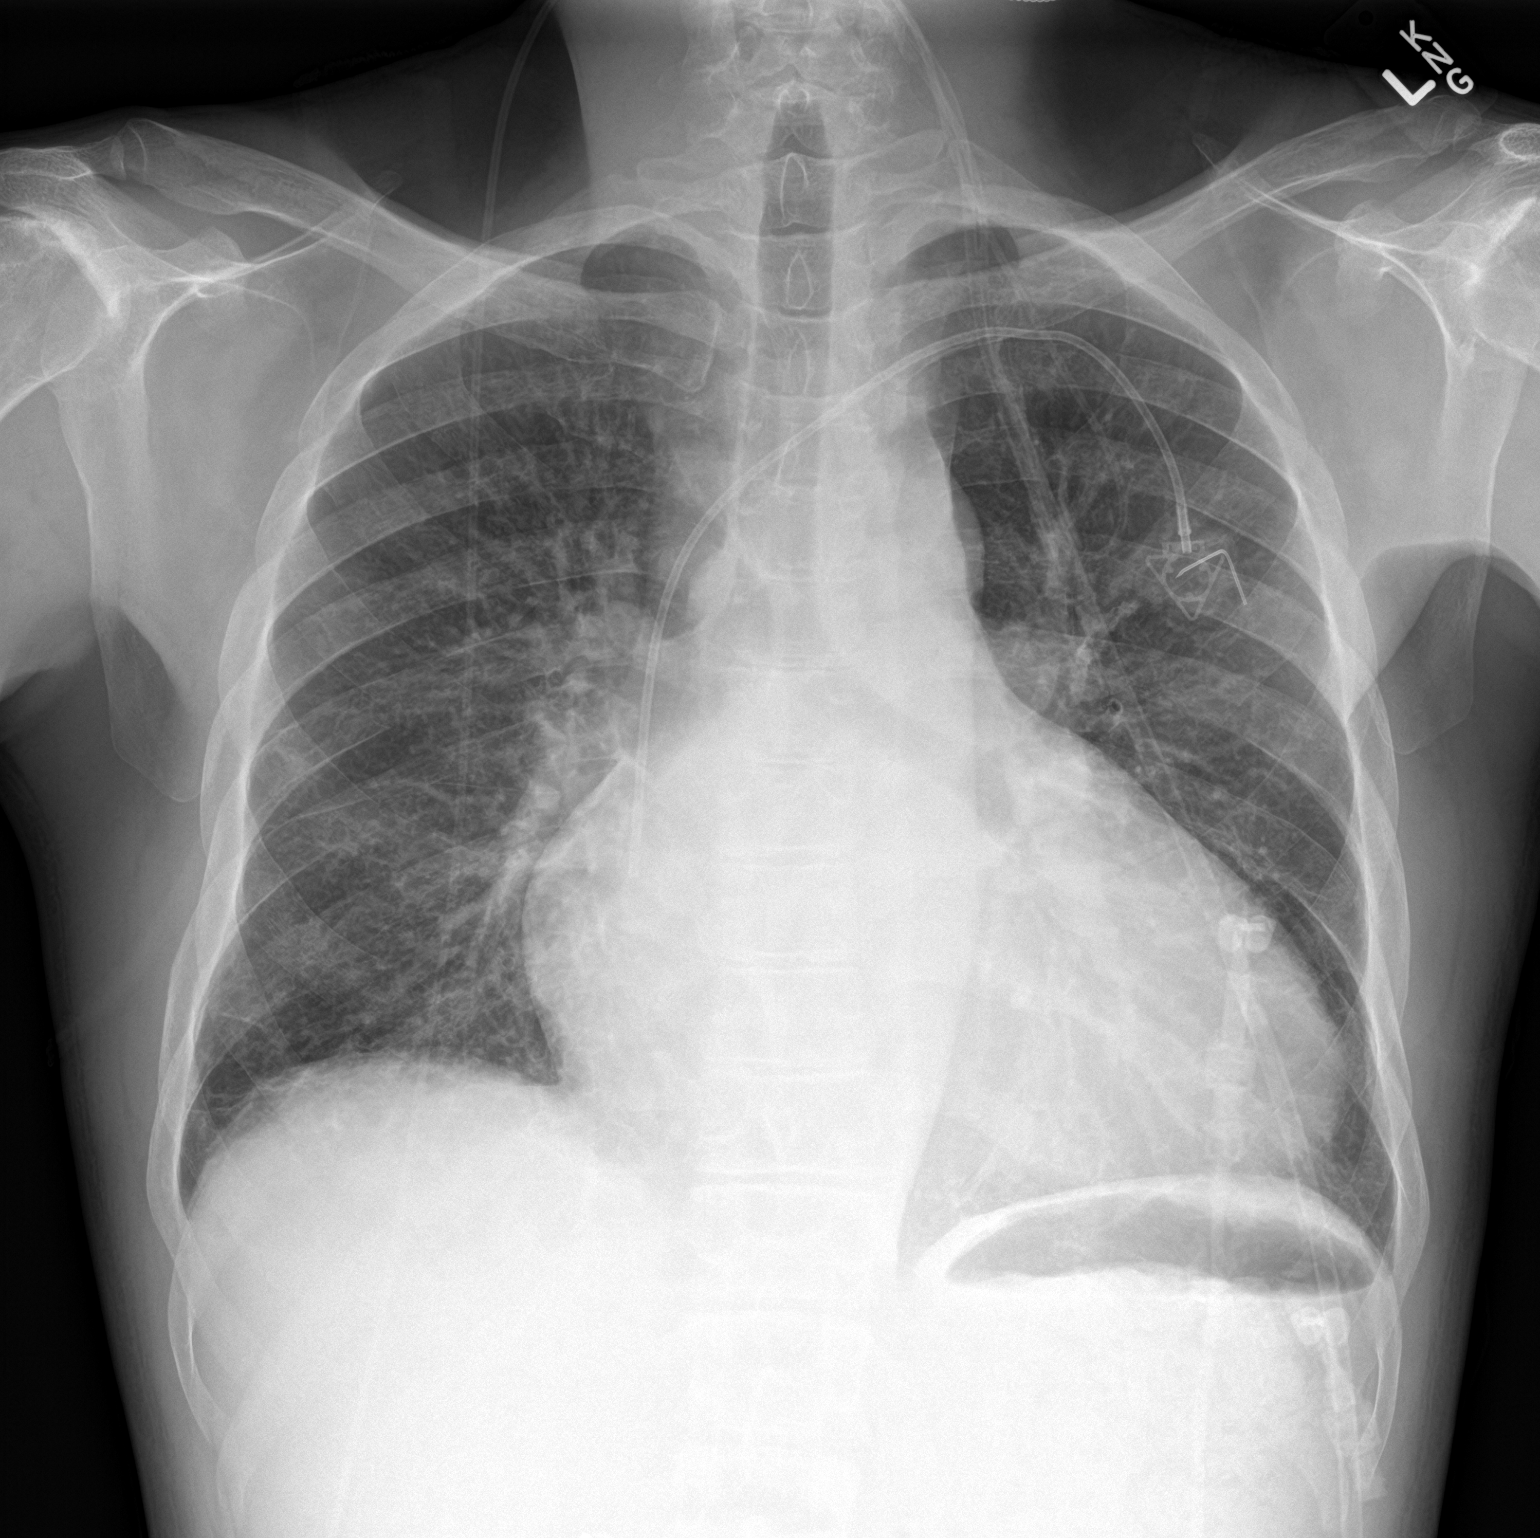

[chest lat]
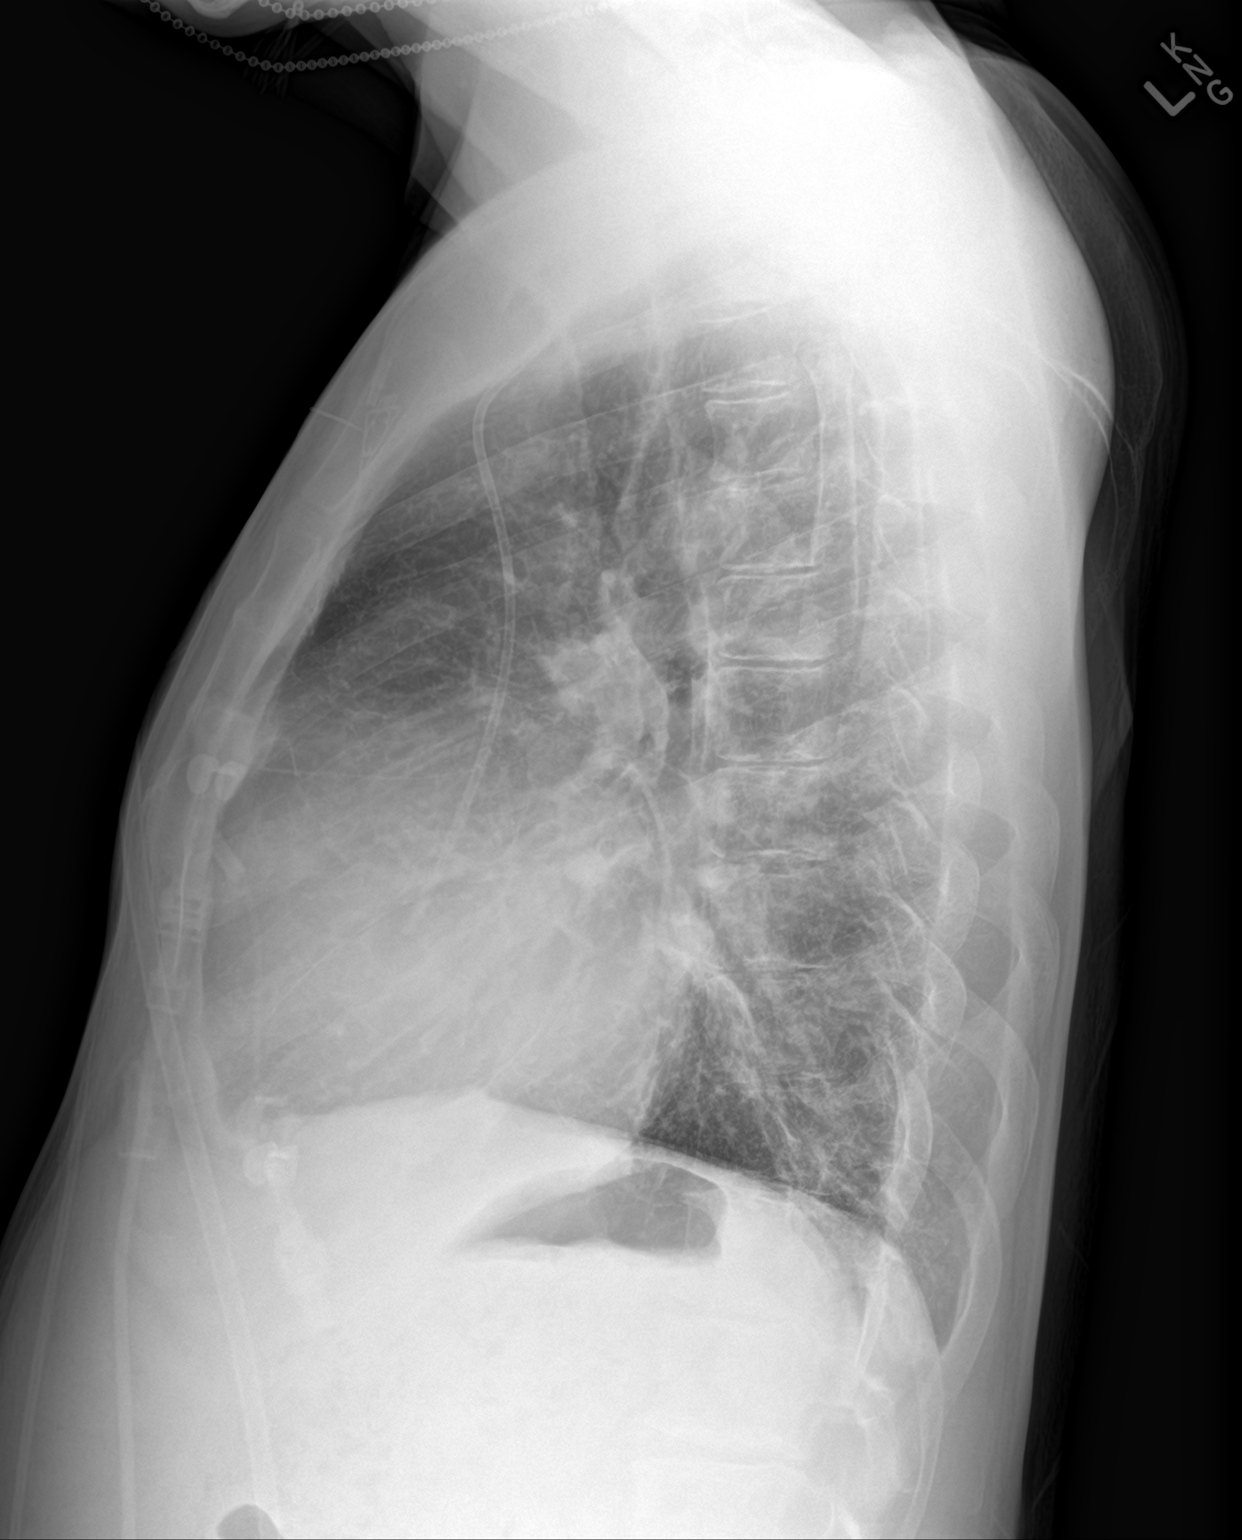

[2 of 2 positions shown; findings below may reference images not displayed]

FINDINGS: The cardiopericardial silhouette remains enlarged. The pulmonary
vascularity is less engorged today. The lungs are well-expanded.
There is no focal infiltrate or pleural effusion. The power port
appliance tip is stable projecting over the distal third of the SVC
just proximal to the cavoatrial junction. There are stable coarse
retrocardiac lung markings. There is multilevel degenerative disc
disease of the thoracic spine.
IMPRESSION: Improved appearance of the pulmonary vascularity and pulmonary
interstitium consistent with resolution of pulmonary edema. Stable
cardiomegaly. There is no evidence of pneumonia.

## 2017-10-23 IMAGING — CT CT ANGIO CHEST
2 of 10 series · 17 of 46 positions shown · IV contrast (OMNIPAQUE 300)
Comparison: 10/26/2014

CLINICAL DATA: Shortness of breath.

EXAM:
CT ANGIOGRAPHY CHEST WITH CONTRAST
TECHNIQUE: Multidetector CT imaging of the chest was performed using the
standard protocol during bolus administration of intravenous
contrast. Multiplanar CT image reconstructions and MIPs were
obtained to evaluate the vascular anatomy.
CONTRAST:  100mL OMNIPAQUE IOHEXOL 350 MG/ML SOLN

[Series 14: thins for pacs · axial · 0.74mm/px · z∈[-236,-22]mm · 14 of 248 slices shown]
[im 17/248  lung]
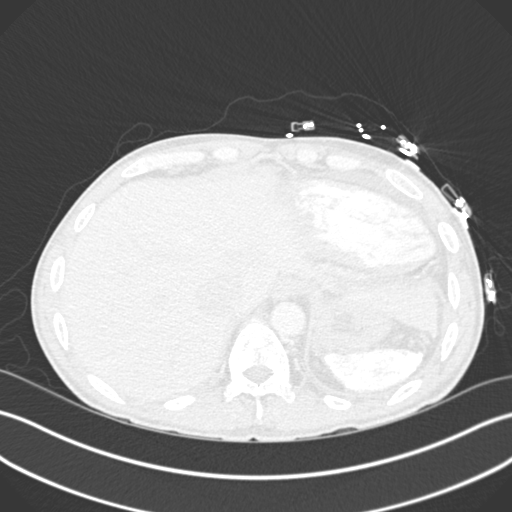
[im 33/248  soft-tissue]
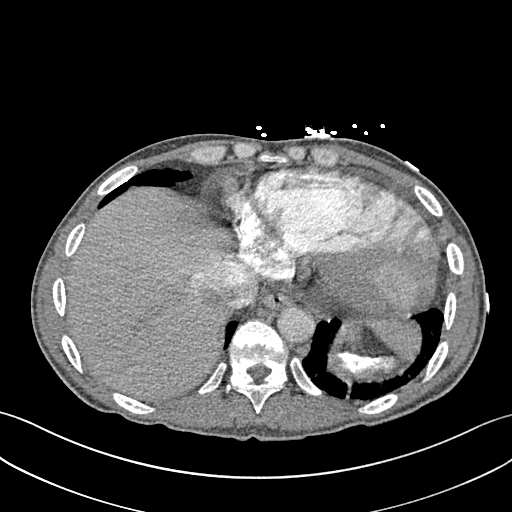
[im 50/248  lung]
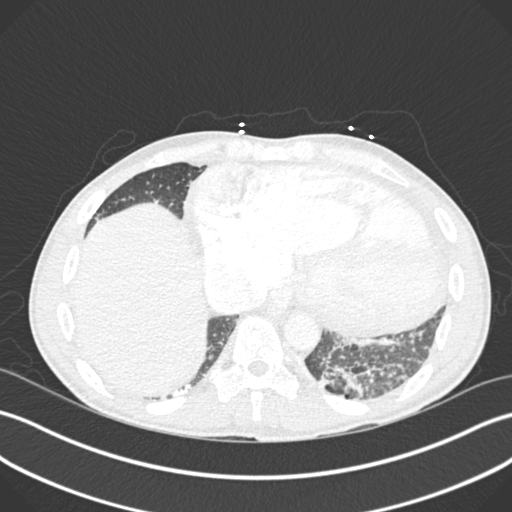
[im 66/248  soft-tissue]
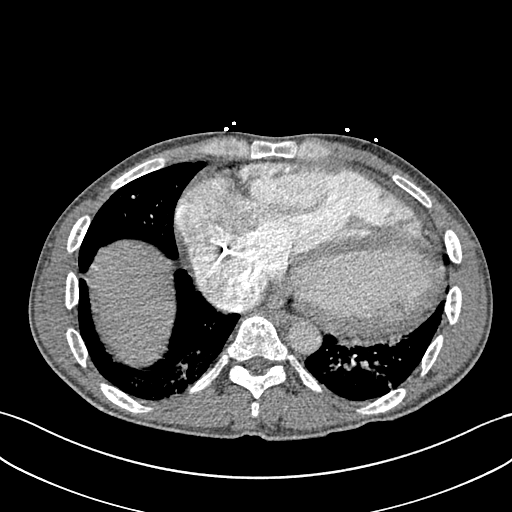
[im 83/248  lung]
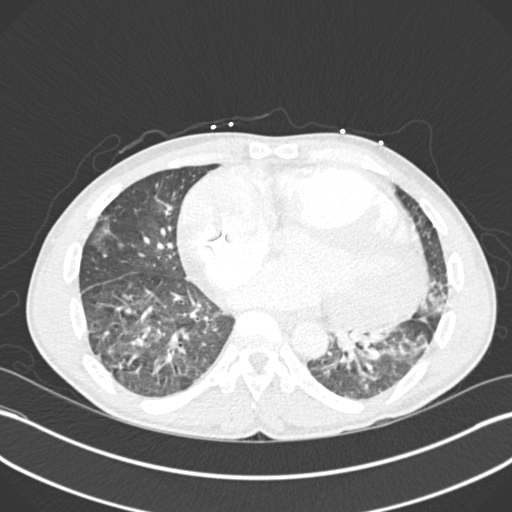
[im 99/248  soft-tissue]
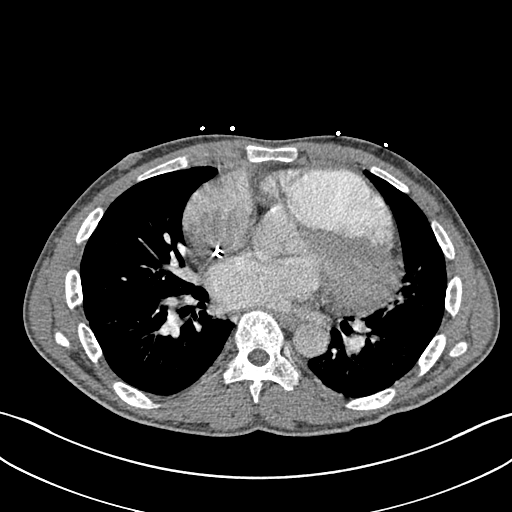
[im 116/248  lung]
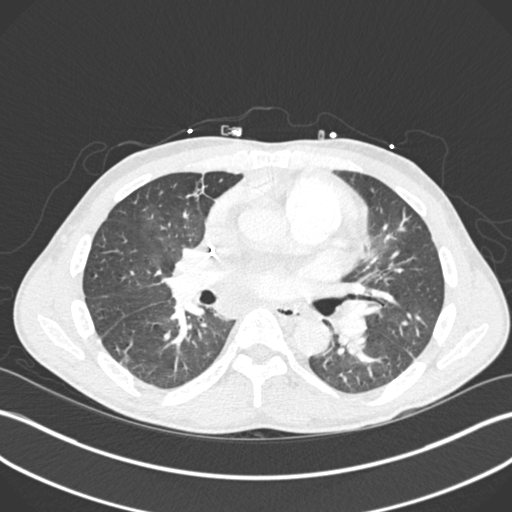
[im 132/248  soft-tissue]
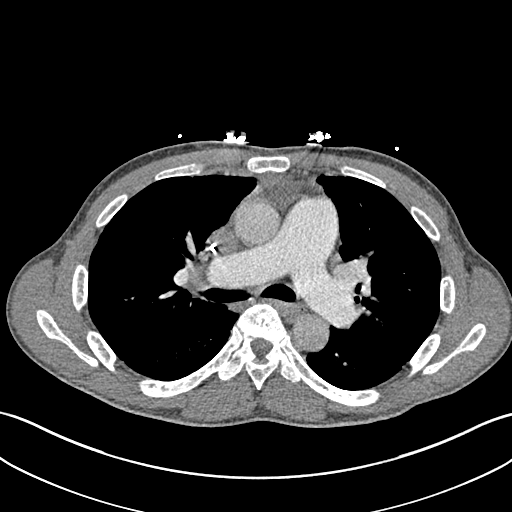
[im 149/248  lung]
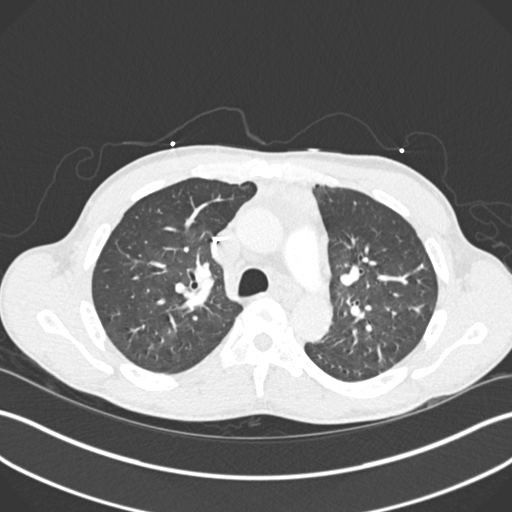
[im 165/248  soft-tissue]
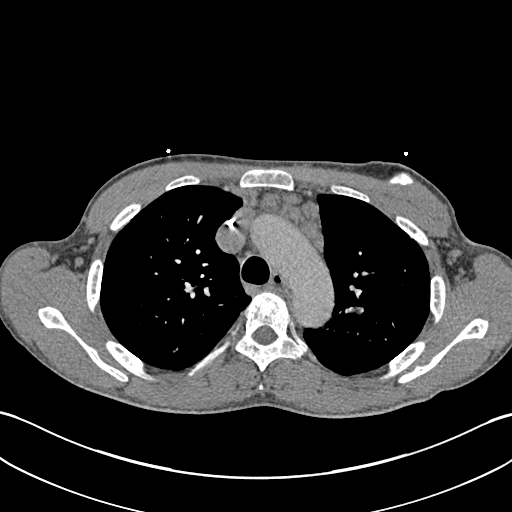
[im 182/248  lung]
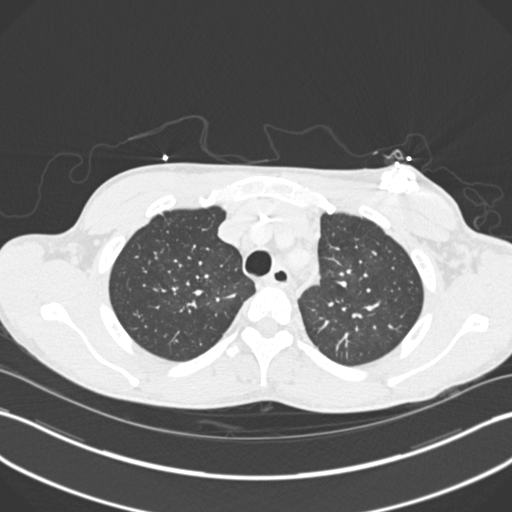
[im 198/248  soft-tissue]
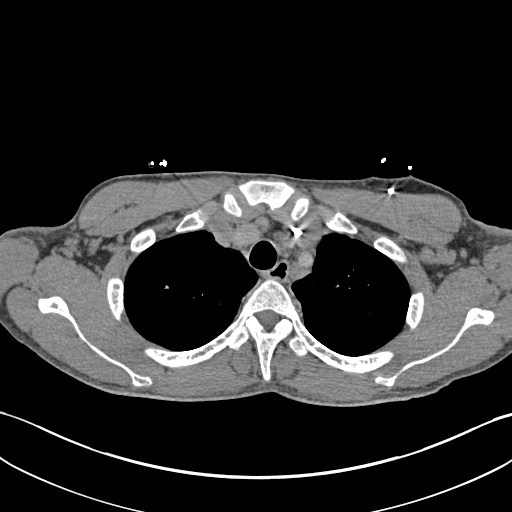
[im 215/248  lung]
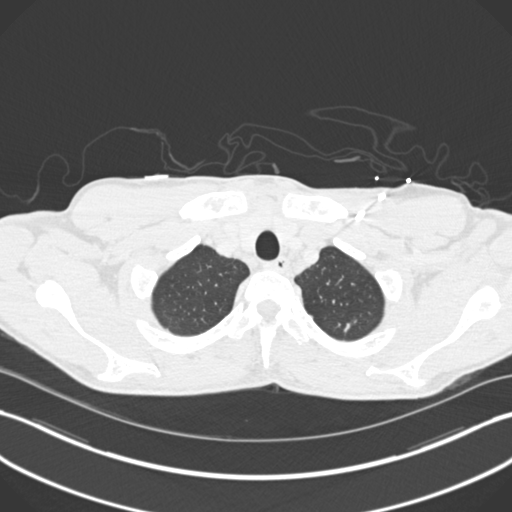
[im 231/248  soft-tissue]
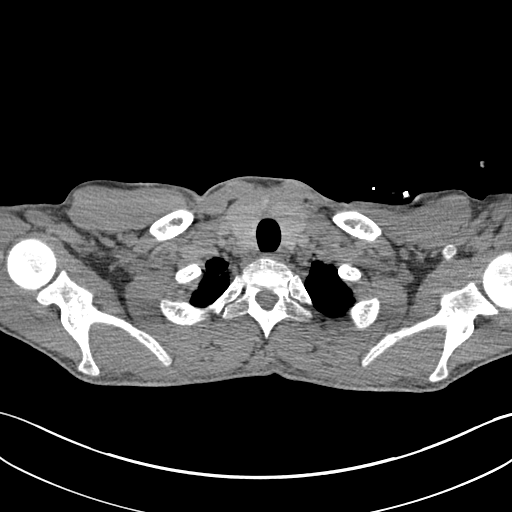

[Series 16: coronal mpr · coronal · 0.52mm/px · 3 of 113 slices shown]
[im 29/113  soft-tissue]
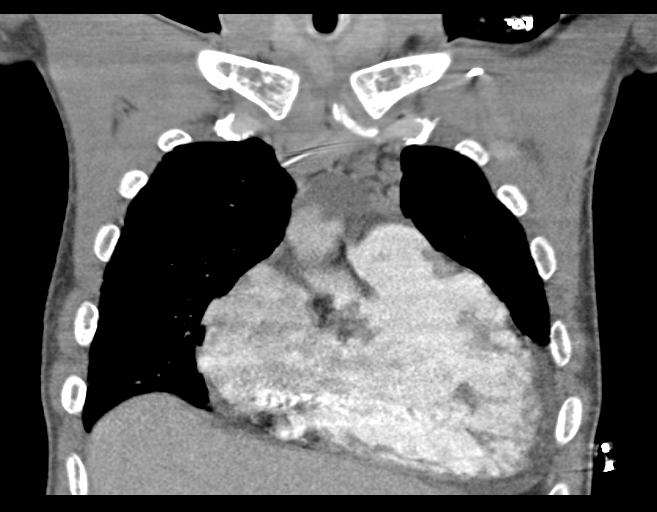
[im 57/113  soft-tissue]
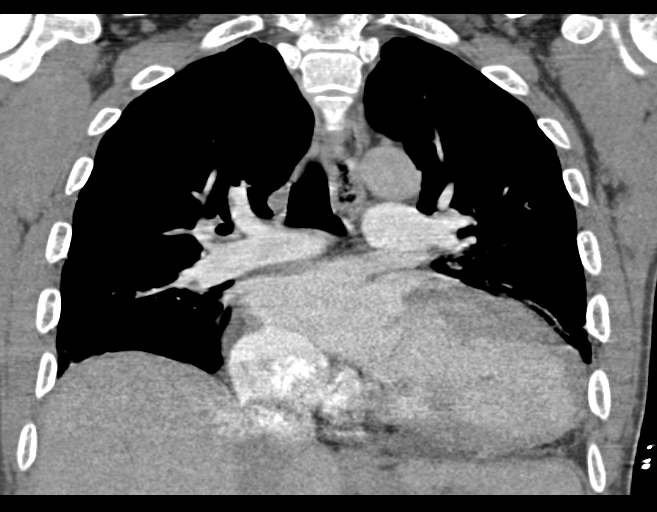
[im 85/113  soft-tissue]
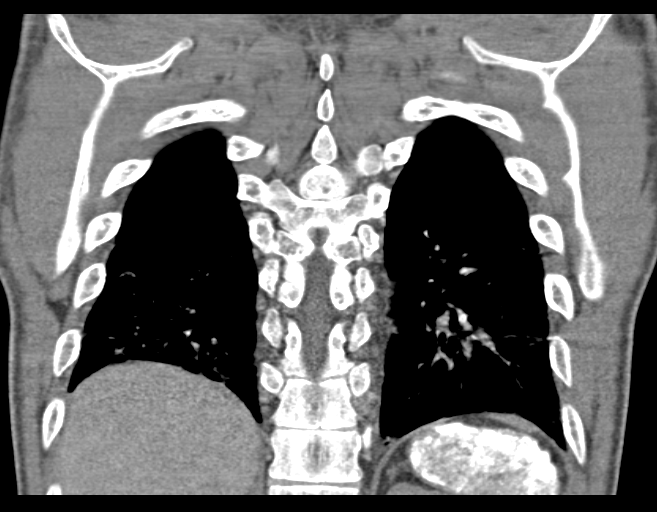

[17 of 46 positions shown; findings below may reference images not displayed]

FINDINGS: Mediastinum: Overall examination is suboptimal. Poor pulmonary
arterial opacification and respiratory motion artifact diminishes
exam detail. The heart size is moderately enlarged. No pericardial
effusion. Prominent anterior mediastinal lymph nodes are again
noted, unchanged from previous exam compatible with adenopathy. The
trachea appears patent and is midline. Unremarkable appearance of
the esophagus. Left upper lobe segmental pulmonary artery filling
defect is identified, image 88 of series 14.

Lungs/Pleura: No pleural effusion identified. There is atelectasis
noted in both lung bases. No airspace consolidation.

Upper Abdomen: The visualized portions of the liver are diffusely
increased and attenuation. Calcified spleen noted.

Musculoskeletal: Bony stigmata of sickle cell disease identified.

Review of the MIP images confirms the above findings.
IMPRESSION: 1. Exam detail diminished secondary to respiratory motion artifact
and poor pulmonary arterial opacification.
2. Examination is positive for at least 1 segmental branch pulmonary
artery filling defect compatible with acute pulmonary embolus.
3. Chronic changes of sickle cell disease.

## 2017-11-21 IMAGING — CR DG CHEST 2V
3 series · 3 of 3 positions shown · non-contrast
Comparison: 12/16/2014

CLINICAL DATA: Sickle Cell Crisis; SOB;dyspnea with exertion; no
chest pain; Former Smoker; Hx HTN; Pulmonary Embolus; Patient has
Port A Cath

EXAM:
CHEST  2 VIEW

[w chest pa]
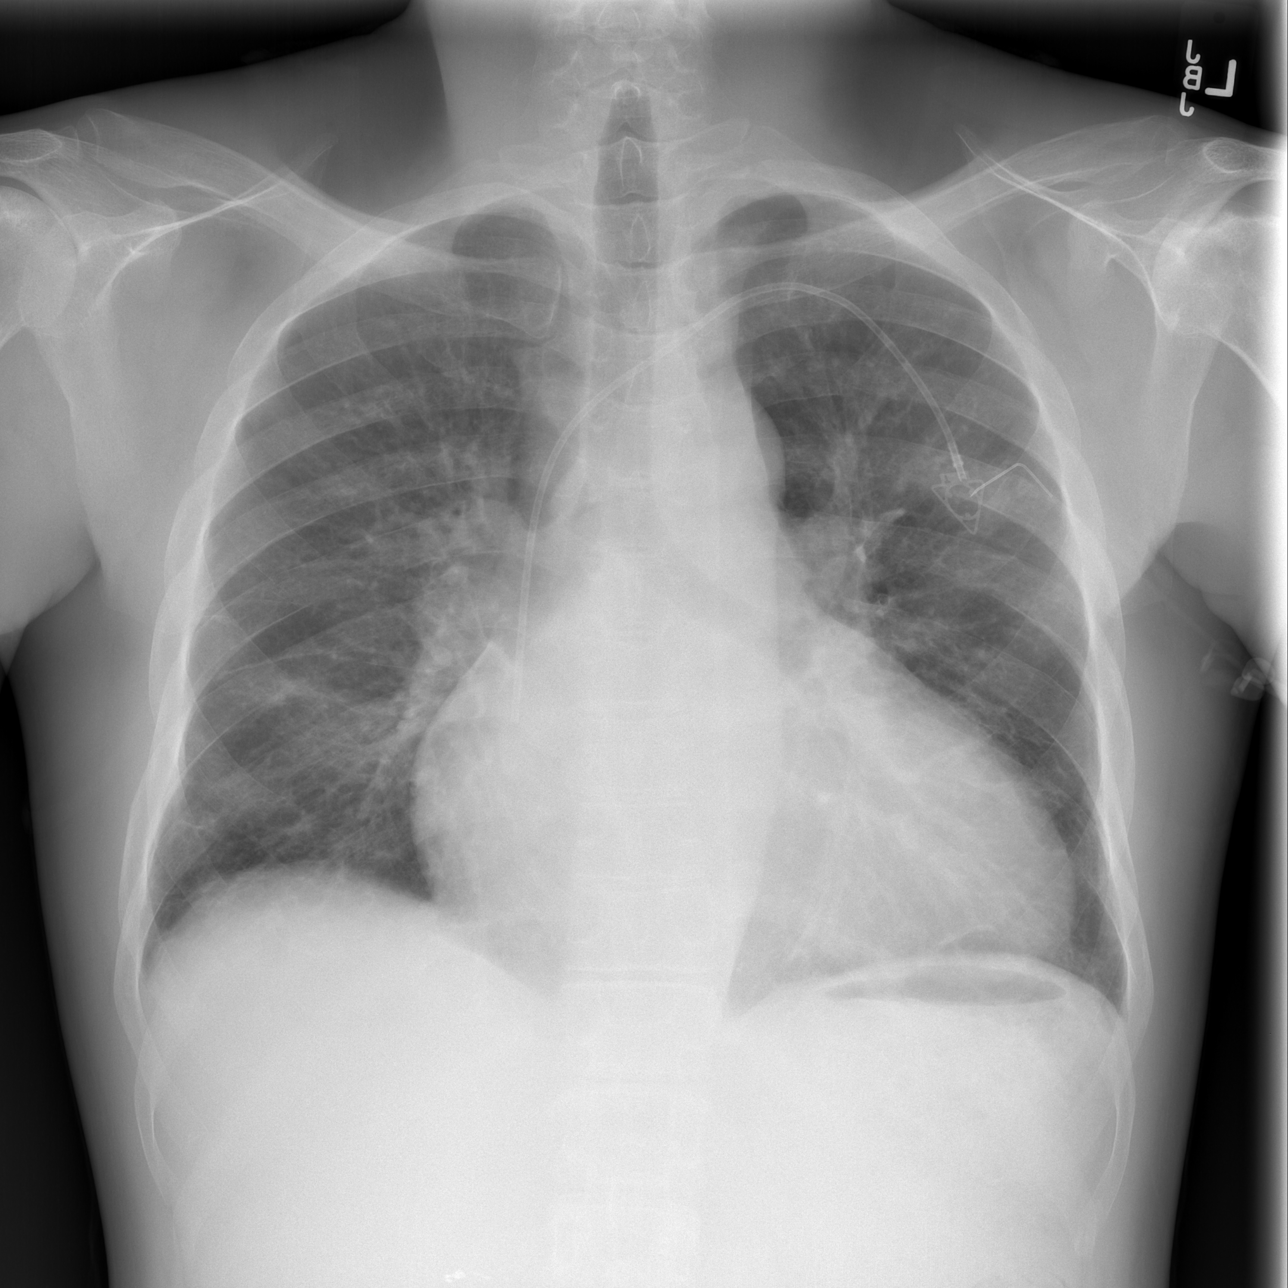

[w chest lat (1 of 2)]
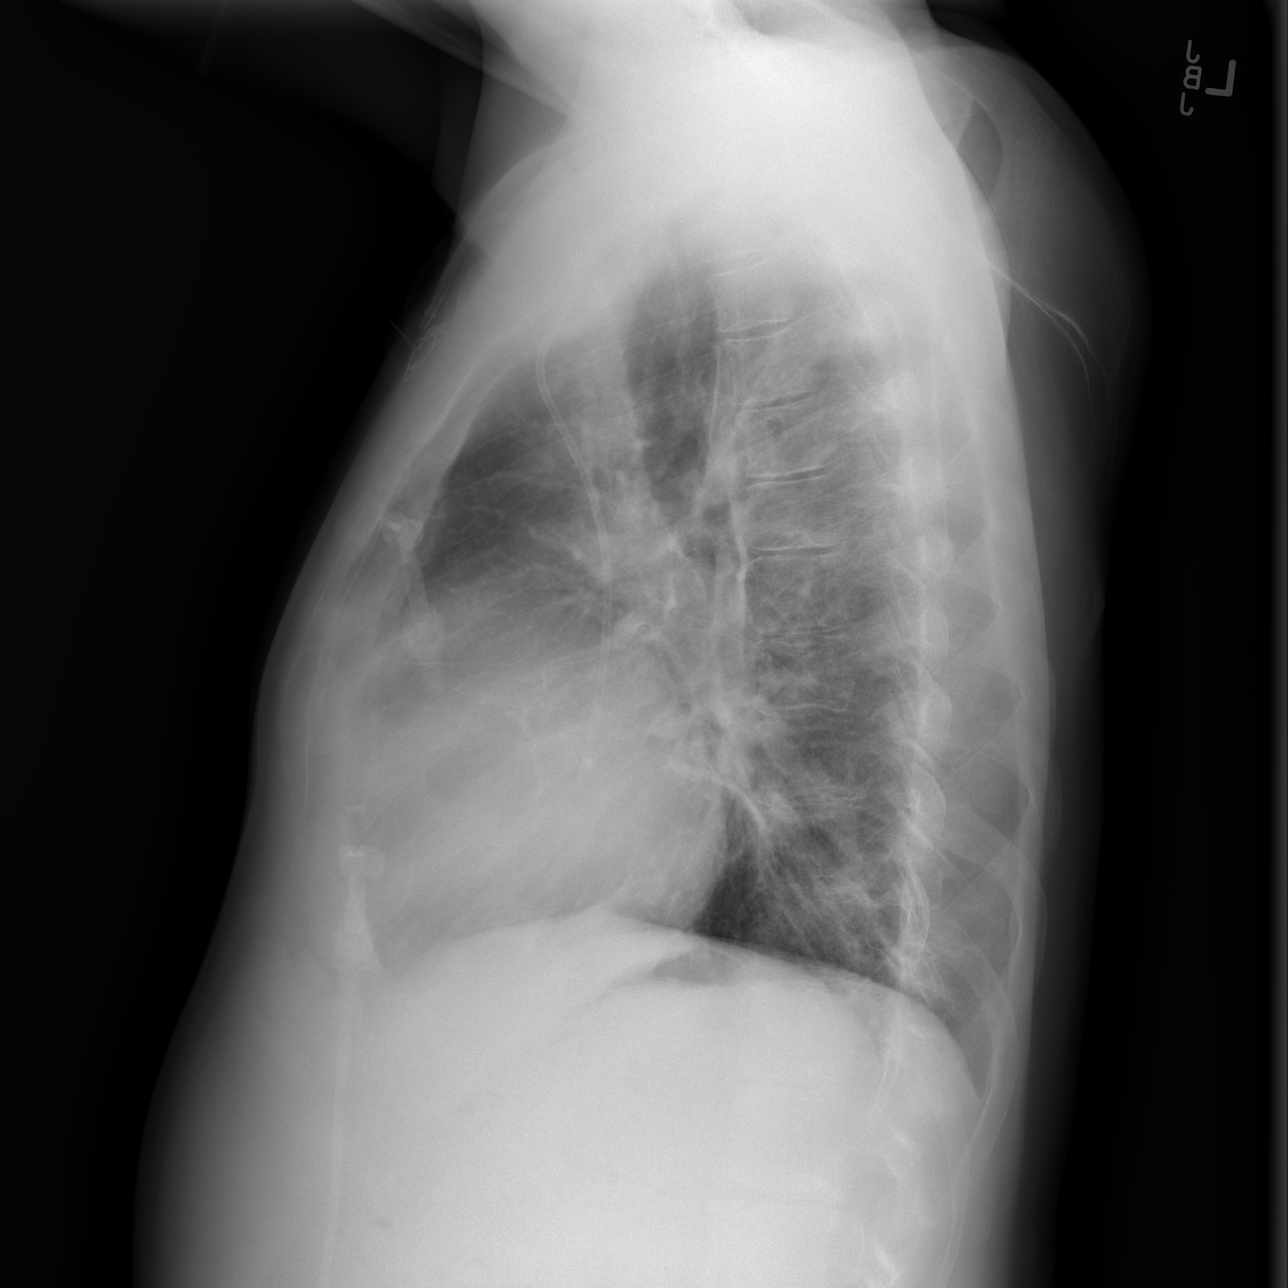

[w chest lat (2 of 2)]
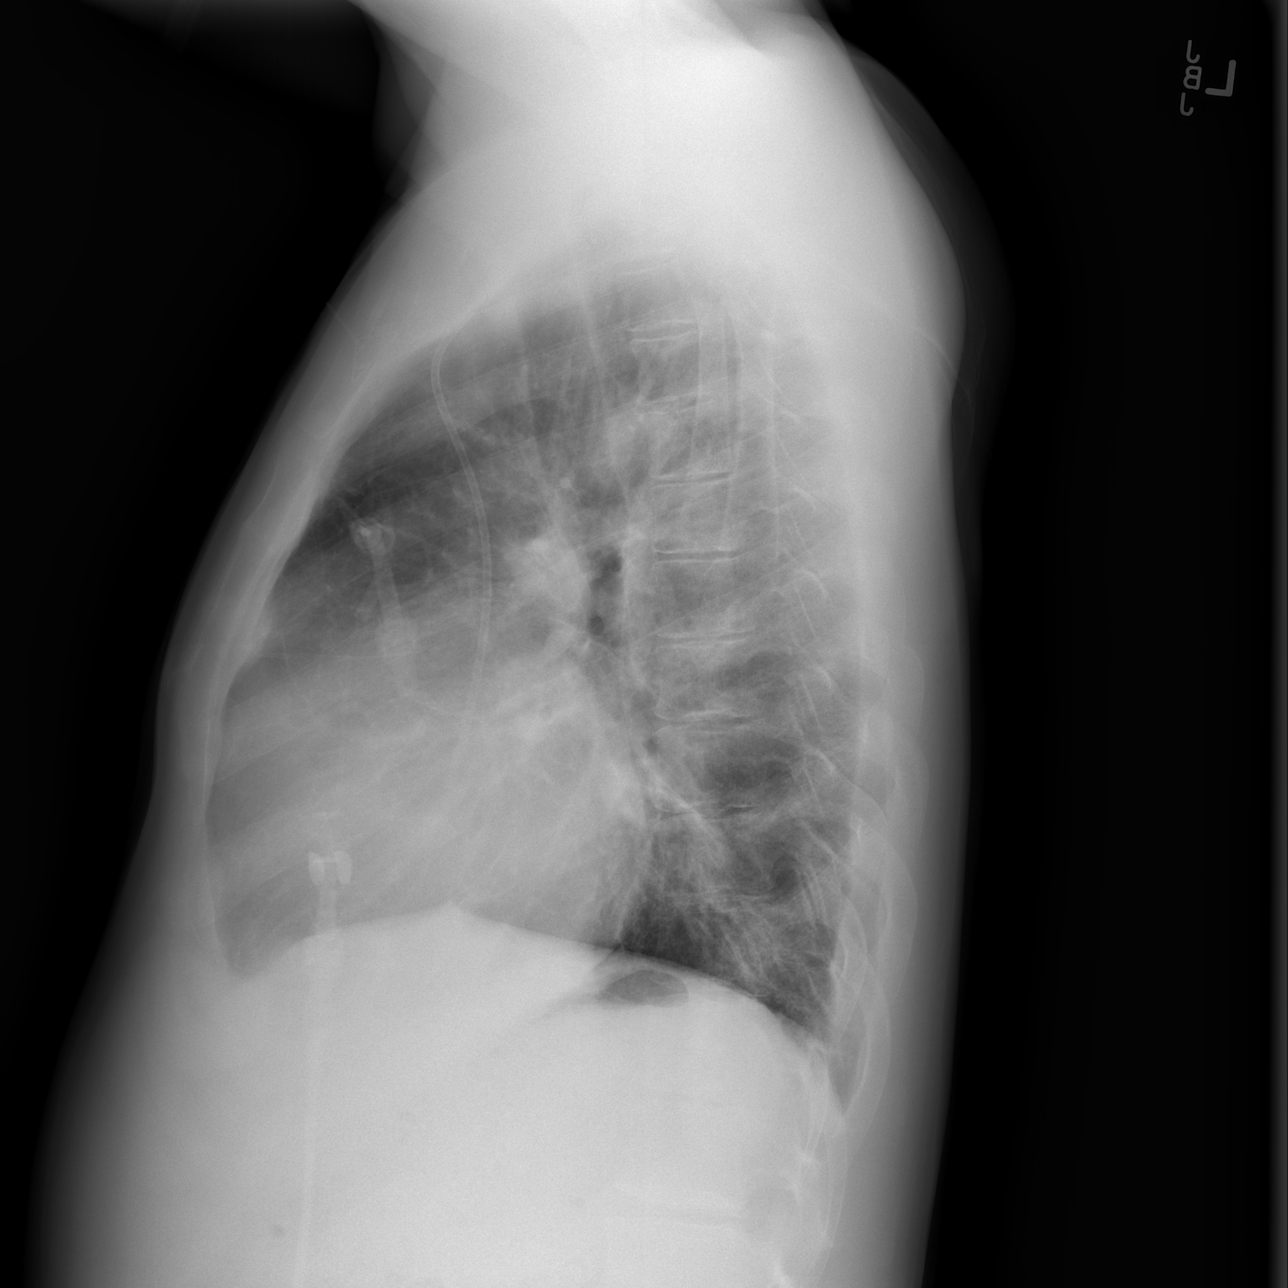

[3 of 3 positions shown; findings below may reference images not displayed]

FINDINGS: Left-sided Port-A-Cath tip to the lower superior vena cava. The
heart is mildly enlarged. There has been some improvement in
aeration of the lungs bilaterally. Very minimal opacities persist
primarily in the right lower lobe. No frank consolidations. No
pleural effusions or pulmonary edema.
IMPRESSION: 1. Stable cardiomegaly.
2. Improved aeration.

## 2017-12-07 IMAGING — CR DG CHEST 1V PORT
1 series · 1 of 1 positions shown · non-contrast
Comparison: 01/08/2015

CLINICAL DATA: Sickle cell pain in the ribs and low back.
Intermittently for the last week.

EXAM:
PORTABLE CHEST 1 VIEW

[AP]
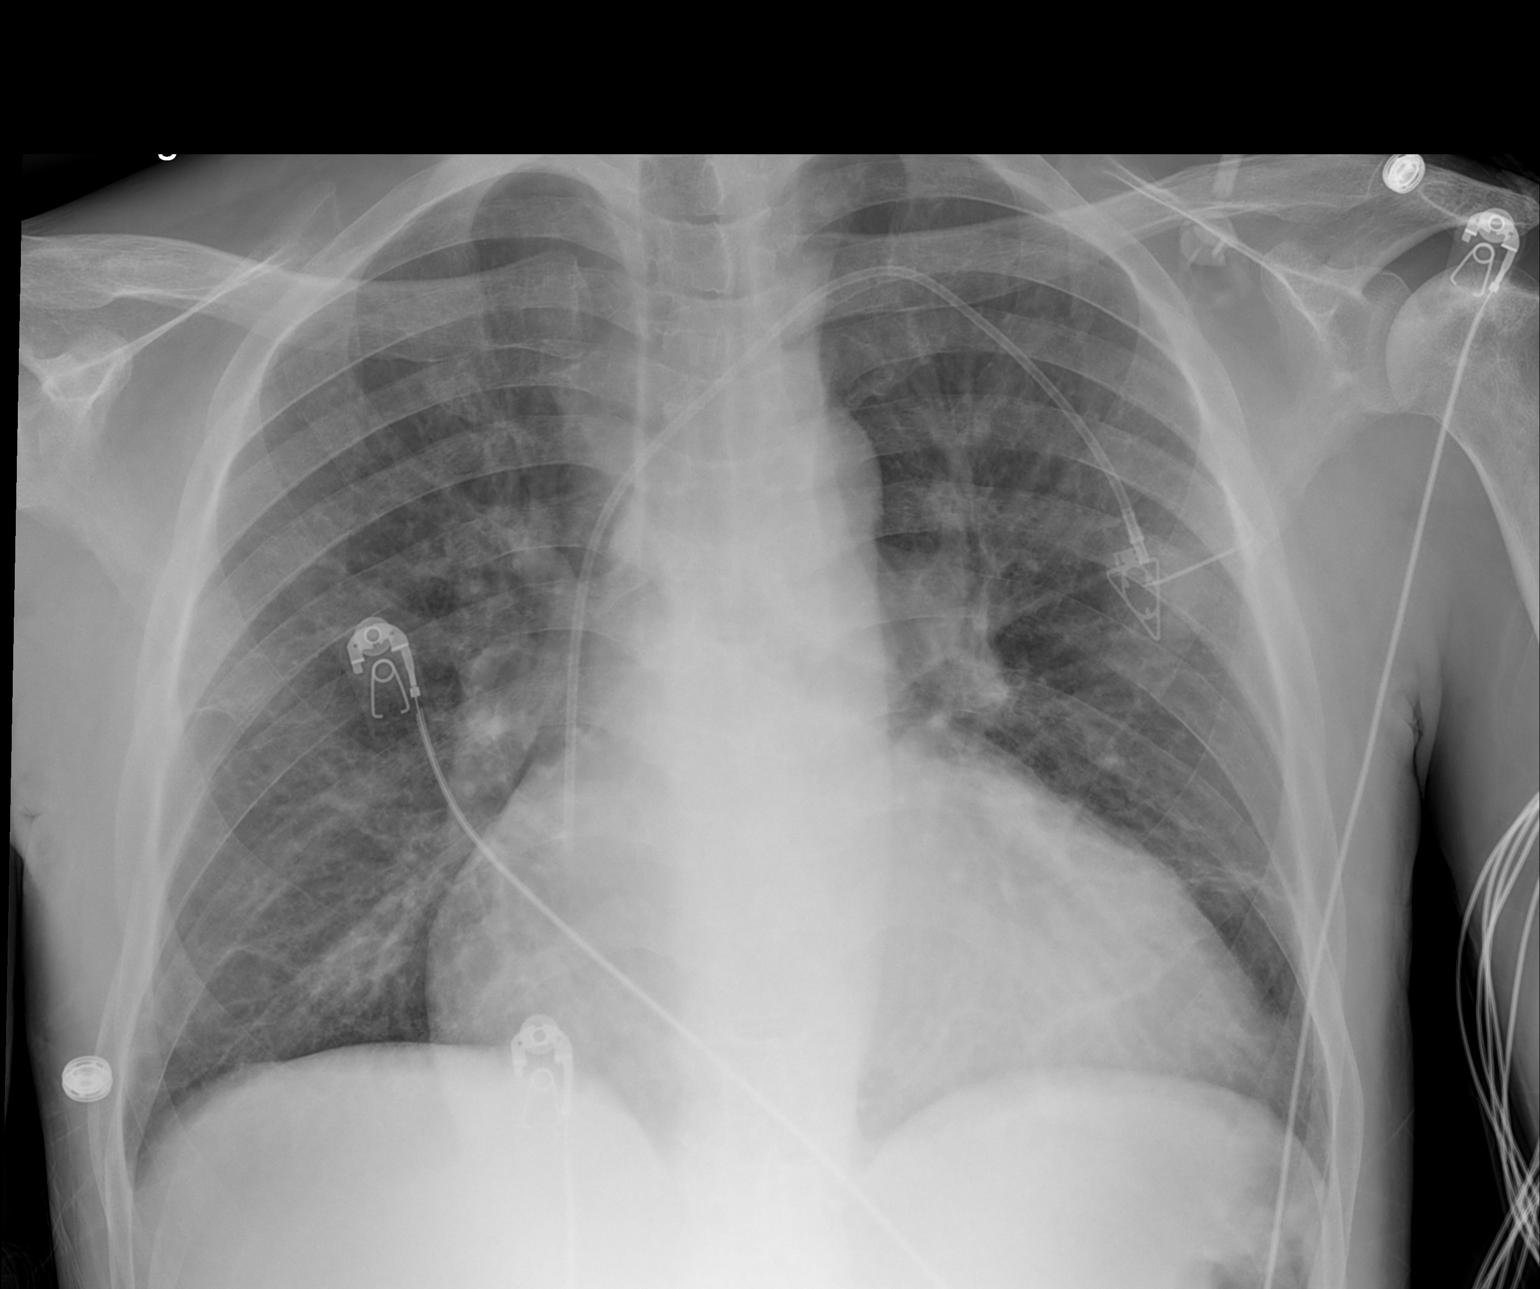

[1 of 1 positions shown; findings below may reference images not displayed]

FINDINGS: Cardiac enlargement with mild pulmonary vascular congestion similar
to prior study. Mild interstitial fibrosis. No evidence of edema or
consolidation. No blunting of costophrenic angles. No pneumothorax.
Mediastinal contours appear intact. Power port type central venous
catheter with tip over the cavoatrial junction region. The sclerosis
in the proximal left humerus consistent with bone infarct.
IMPRESSION: Cardiac enlargement with mild pulmonary vascular congestion. No
focal consolidation.

## 2017-12-19 IMAGING — CR DG CHEST 2V
2 series · 2 of 2 positions shown · non-contrast
Comparison: Chest radiograph from 01/14/2015

CLINICAL DATA: Acute onset of bilateral generalized chest pain.
Initial encounter.

EXAM:
CHEST  2 VIEW

[w chest pa]
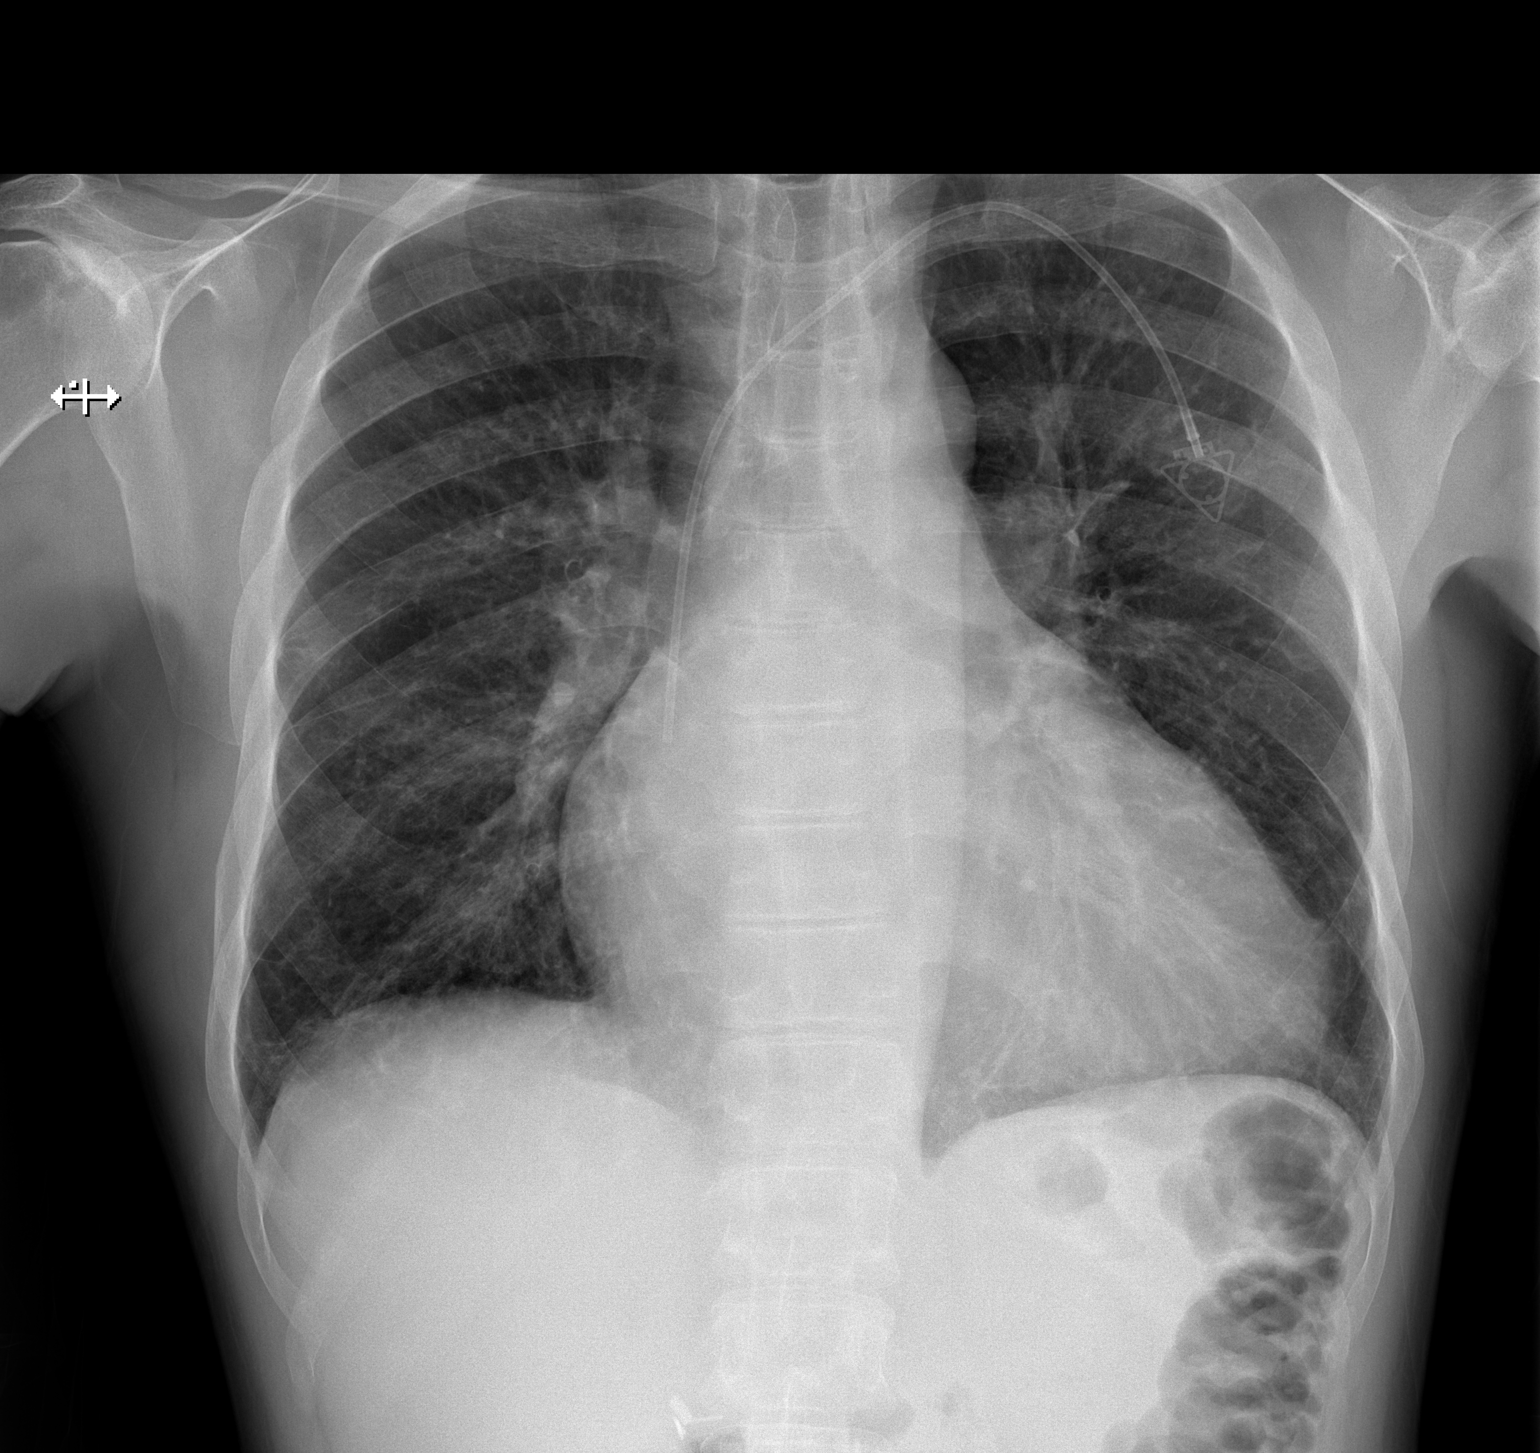

[w chest lat]
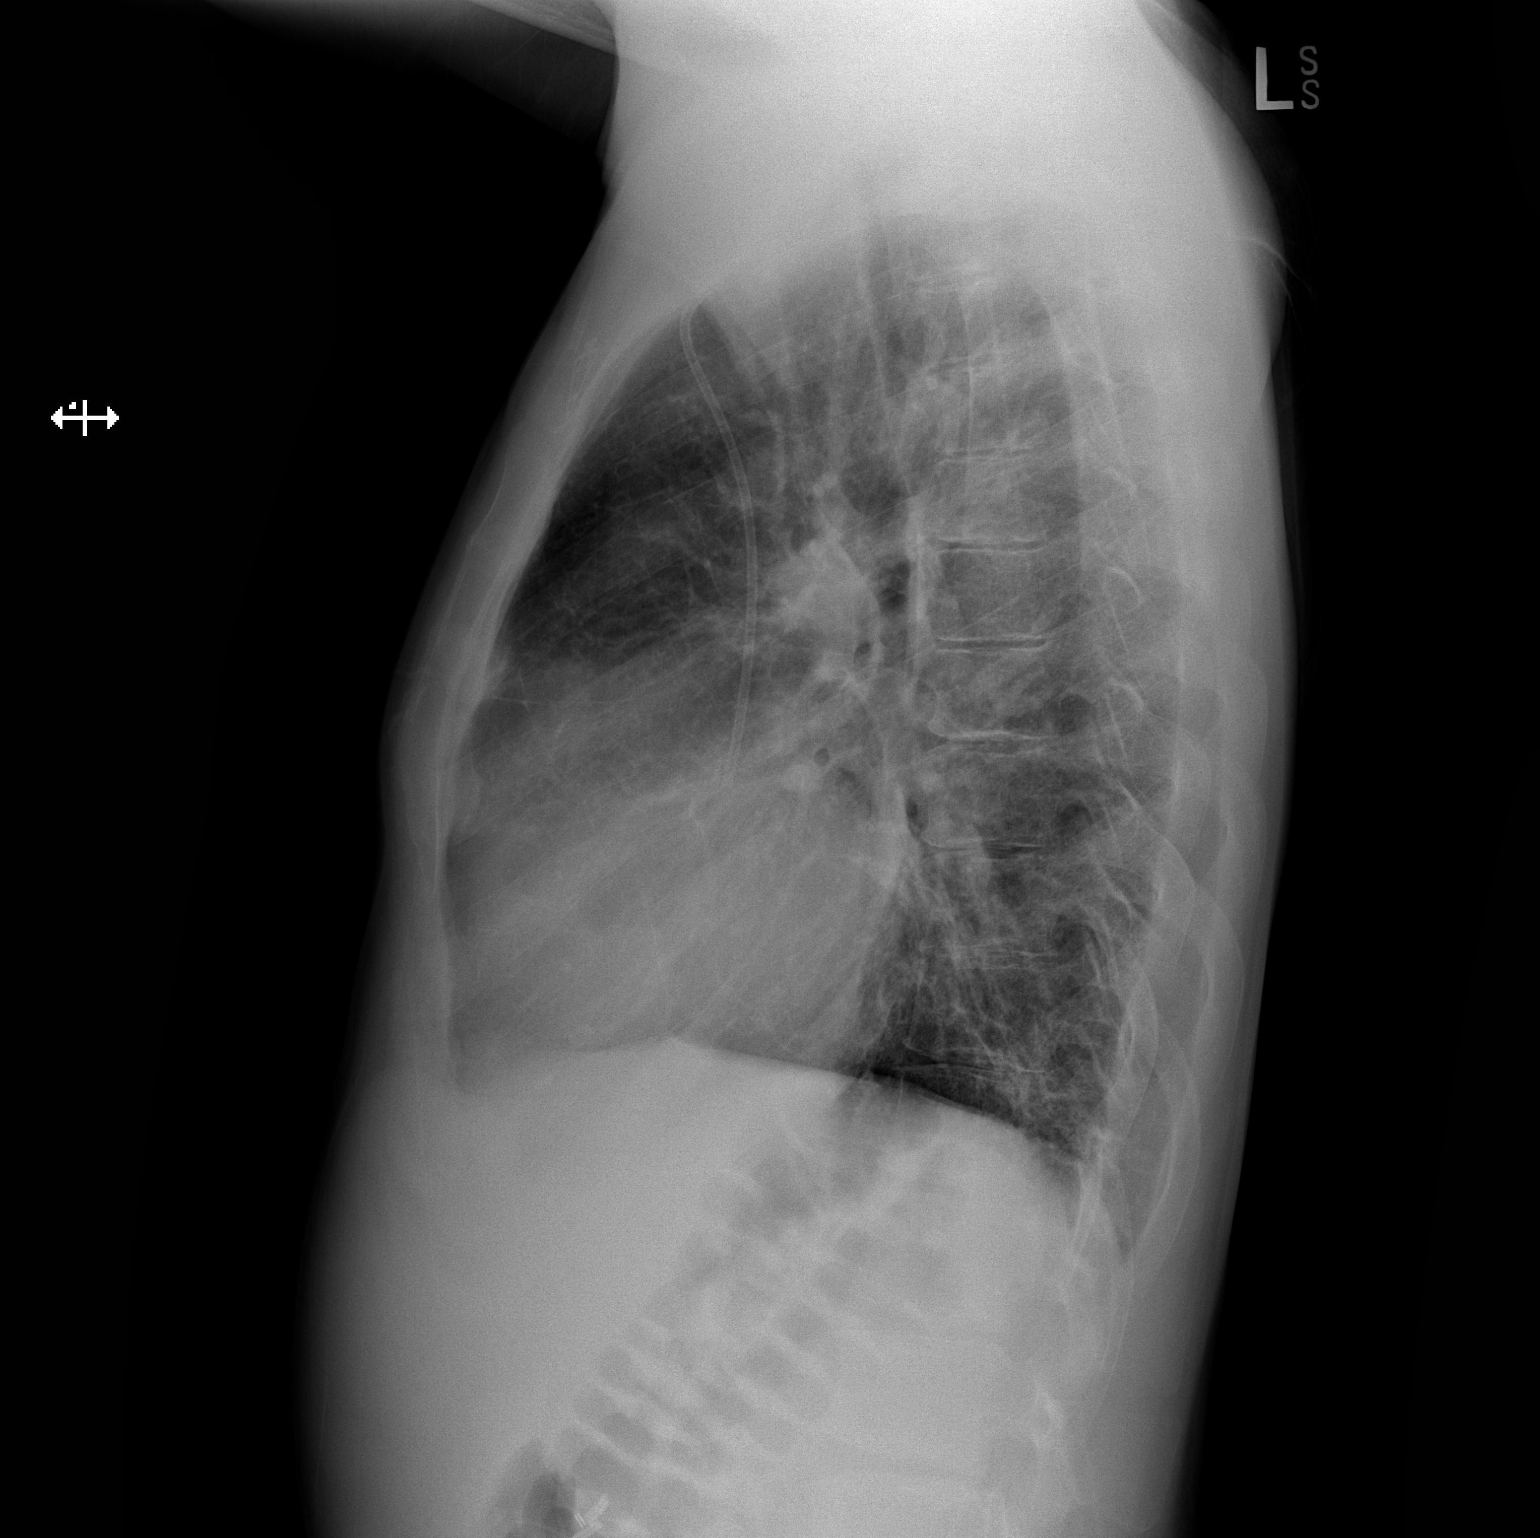

[2 of 2 positions shown; findings below may reference images not displayed]

FINDINGS: The lungs are well-aerated. Peribronchial thickening is noted. There
is no evidence of focal opacification, pleural effusion or
pneumothorax.

The heart is enlarged. A left-sided chest port is noted ending about
the distal SVC. No acute osseous abnormalities are seen. Clips are
noted within the right upper quadrant, reflecting prior
cholecystectomy.
IMPRESSION: Peribronchial thickening noted. Lungs otherwise grossly clear.
Cardiomegaly noted.

## 2018-01-18 IMAGING — CR DG CHEST 2V
2 series · 2 of 2 positions shown · non-contrast
Comparison: 01/31/2015

CLINICAL DATA: Sickle cell crisis, pain in ribs and legs,
generalized headache, former smoker, leukocytosis

EXAM:
CHEST  2 VIEW

[w chest pa]
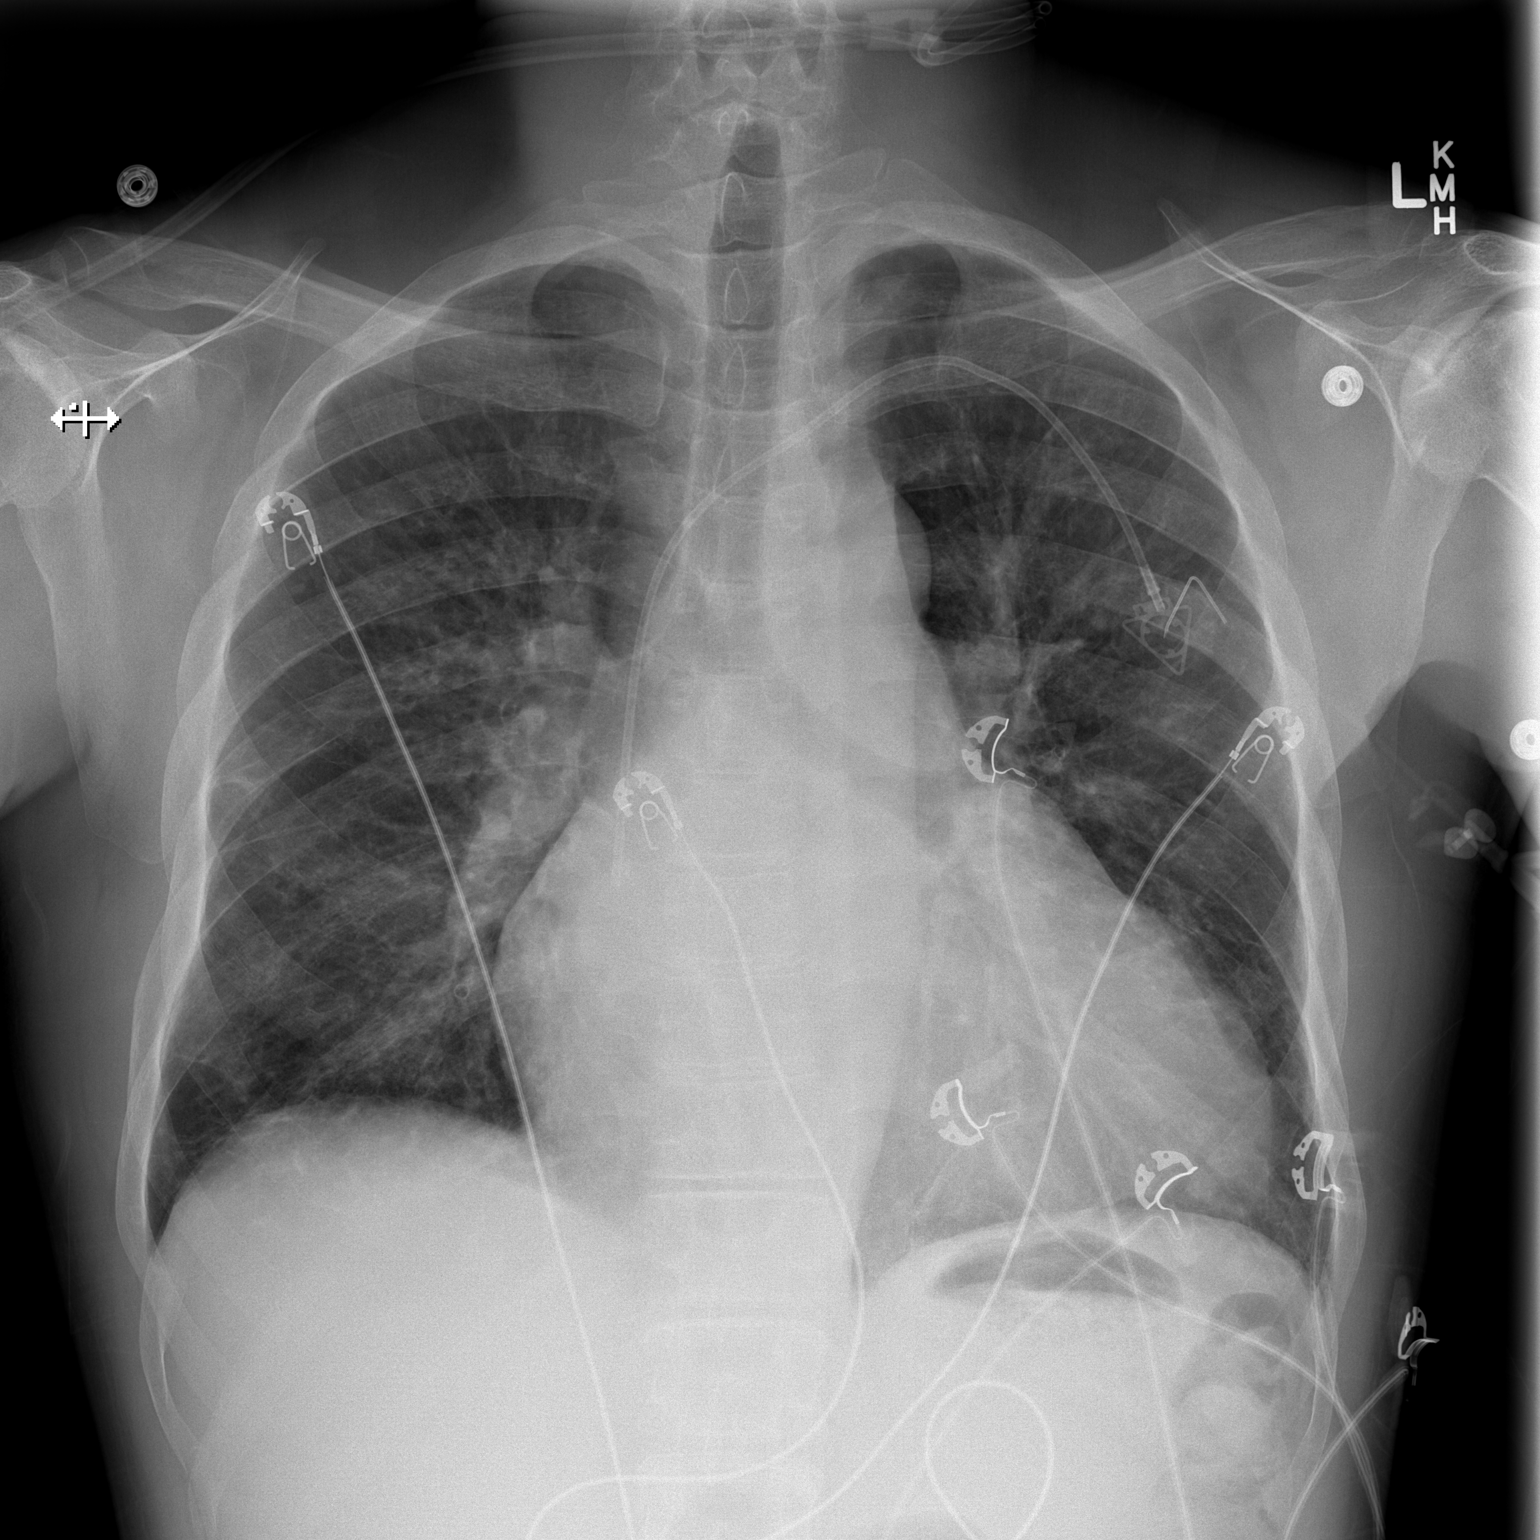

[w chest lat]
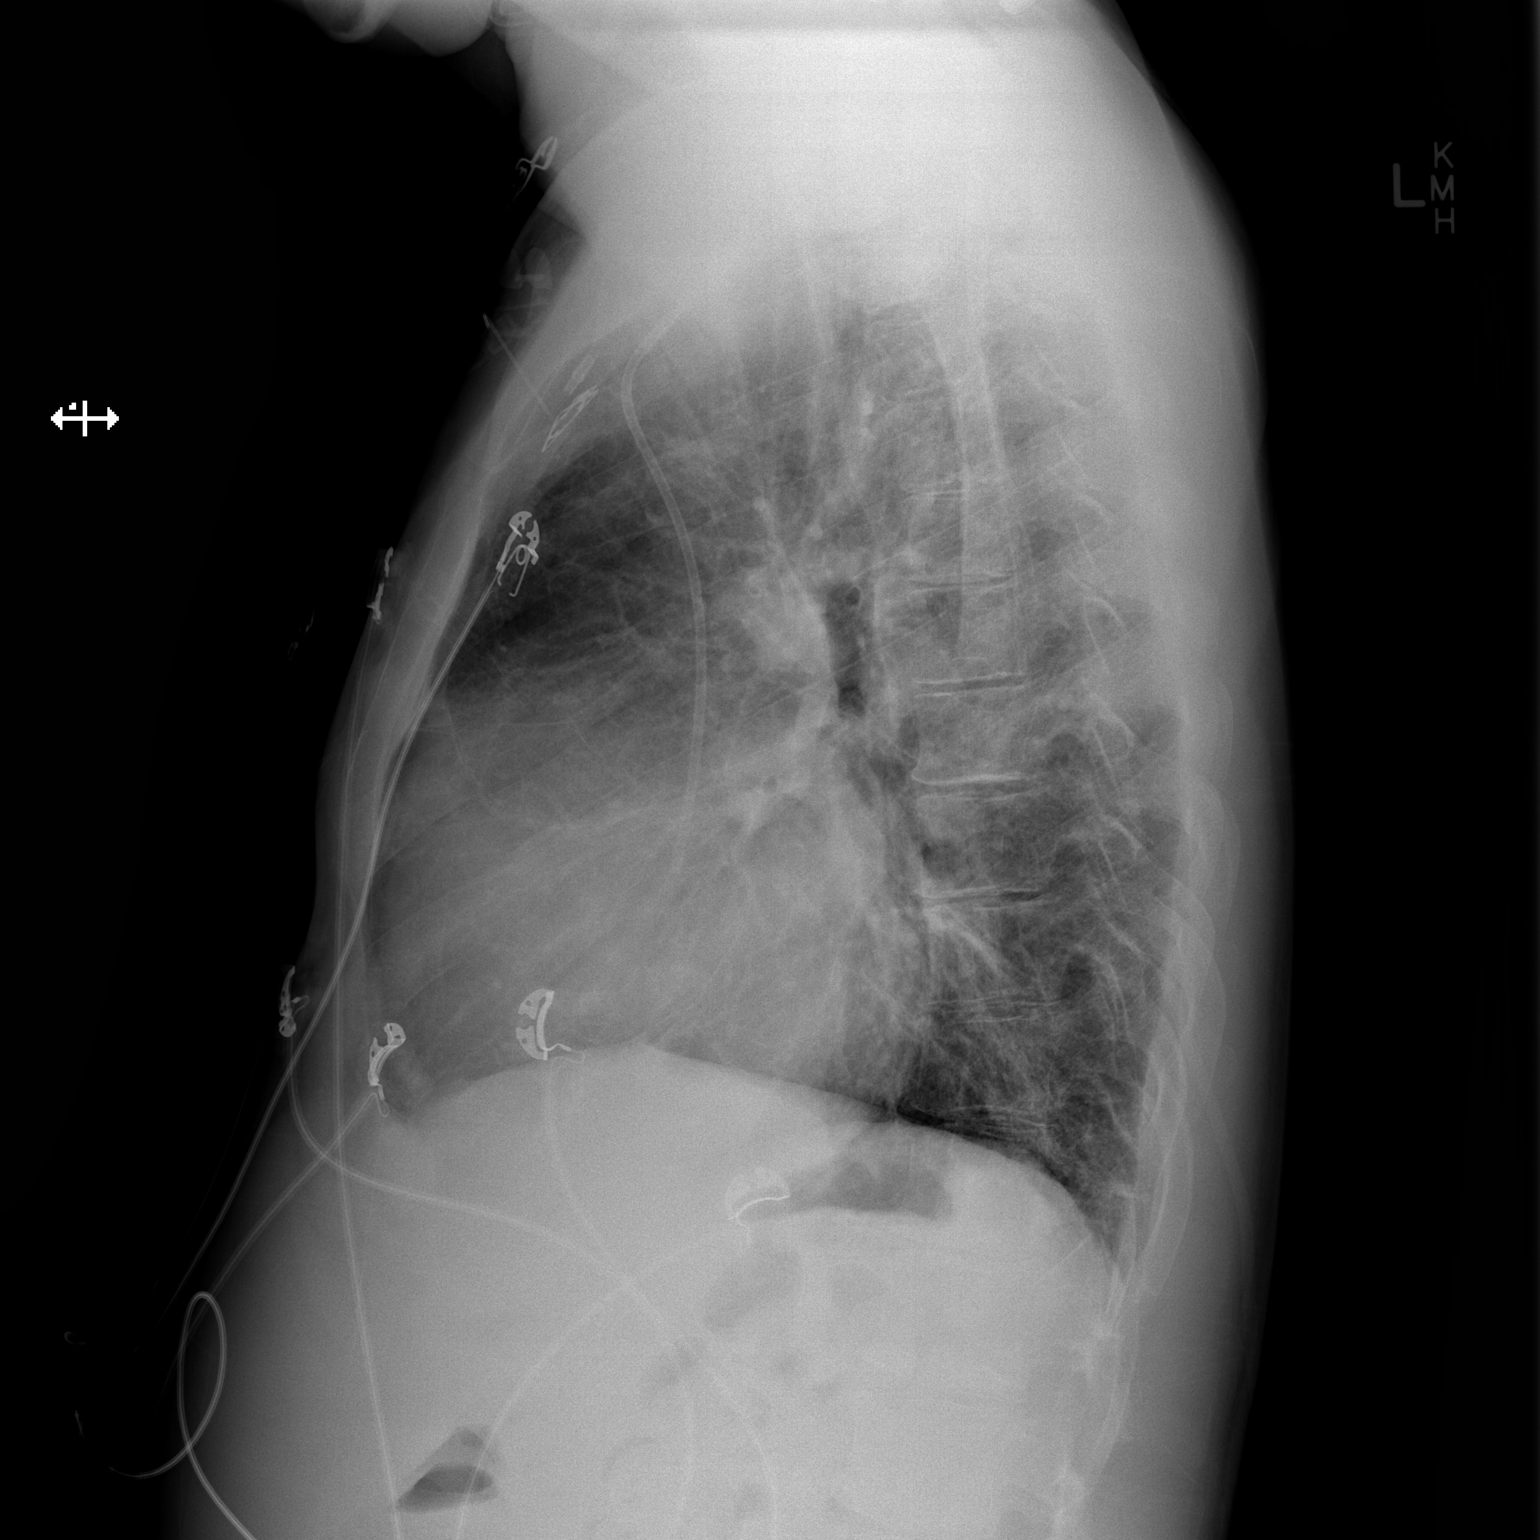

[2 of 2 positions shown; findings below may reference images not displayed]

FINDINGS: LEFT subclavian Port-A-Cath with tip projecting over SVC near
cavoatrial junction.

Enlargement of cardiac silhouette with pulmonary vascular
congestion.

Mediastinal contour stable.

Increased opacity in RIGHT upper lobe suspicious for pneumonia.

Remaining lungs clear.

Mild chronic central peribronchial thickening.

No pleural effusion or pneumothorax.

Osseous structures stable
IMPRESSION: Enlargement of cardiac silhouette with pulmonary vascular
congestion.

Question developing RIGHT upper lobe infiltrate.

## 2018-01-25 IMAGING — CR DG CHEST 2V
2 series · 2 of 2 positions shown · non-contrast
Comparison: 02/25/2015

CLINICAL DATA: Sickle cell pain, crisis, left-sided chest pain

EXAM:
CHEST  2 VIEW

[w chest pa]
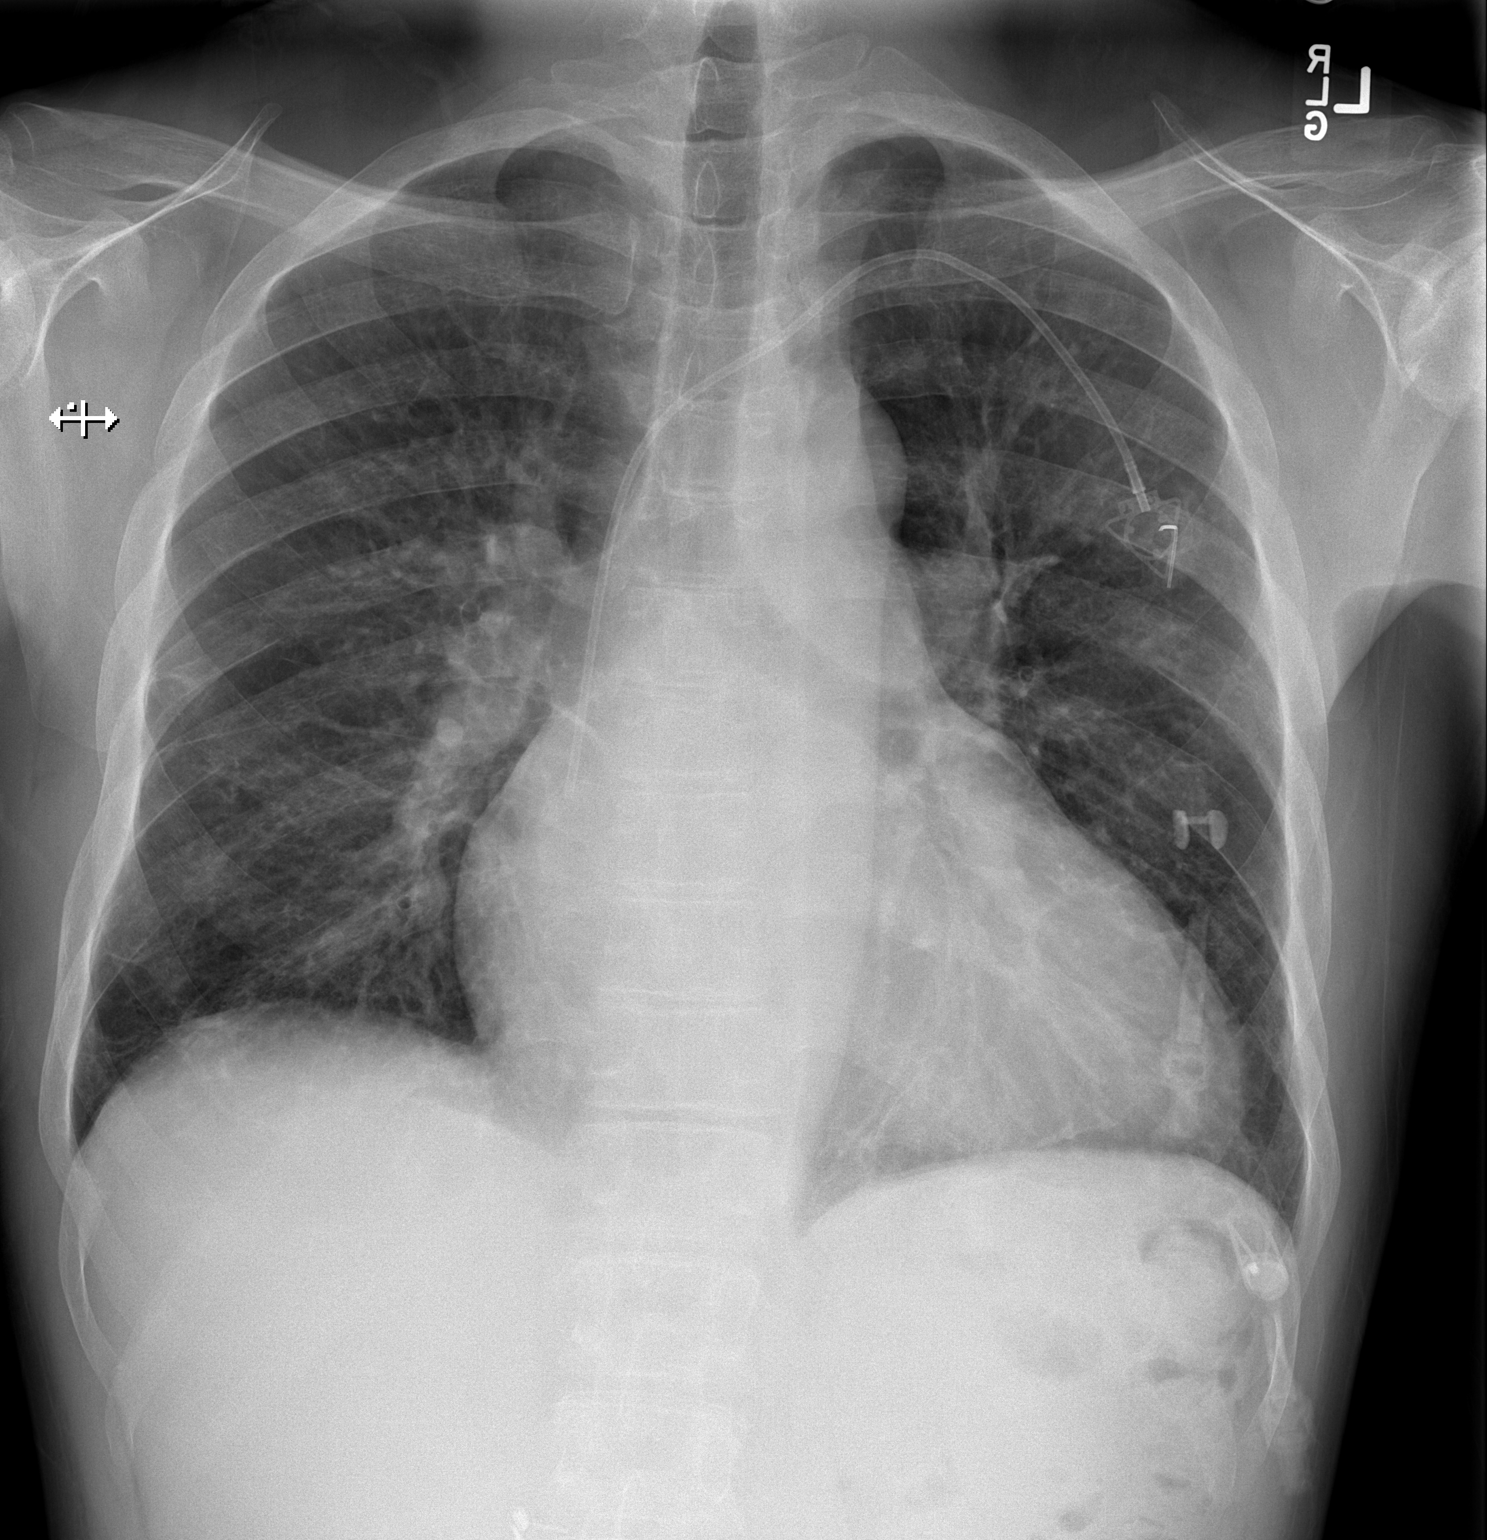

[w chest lat]
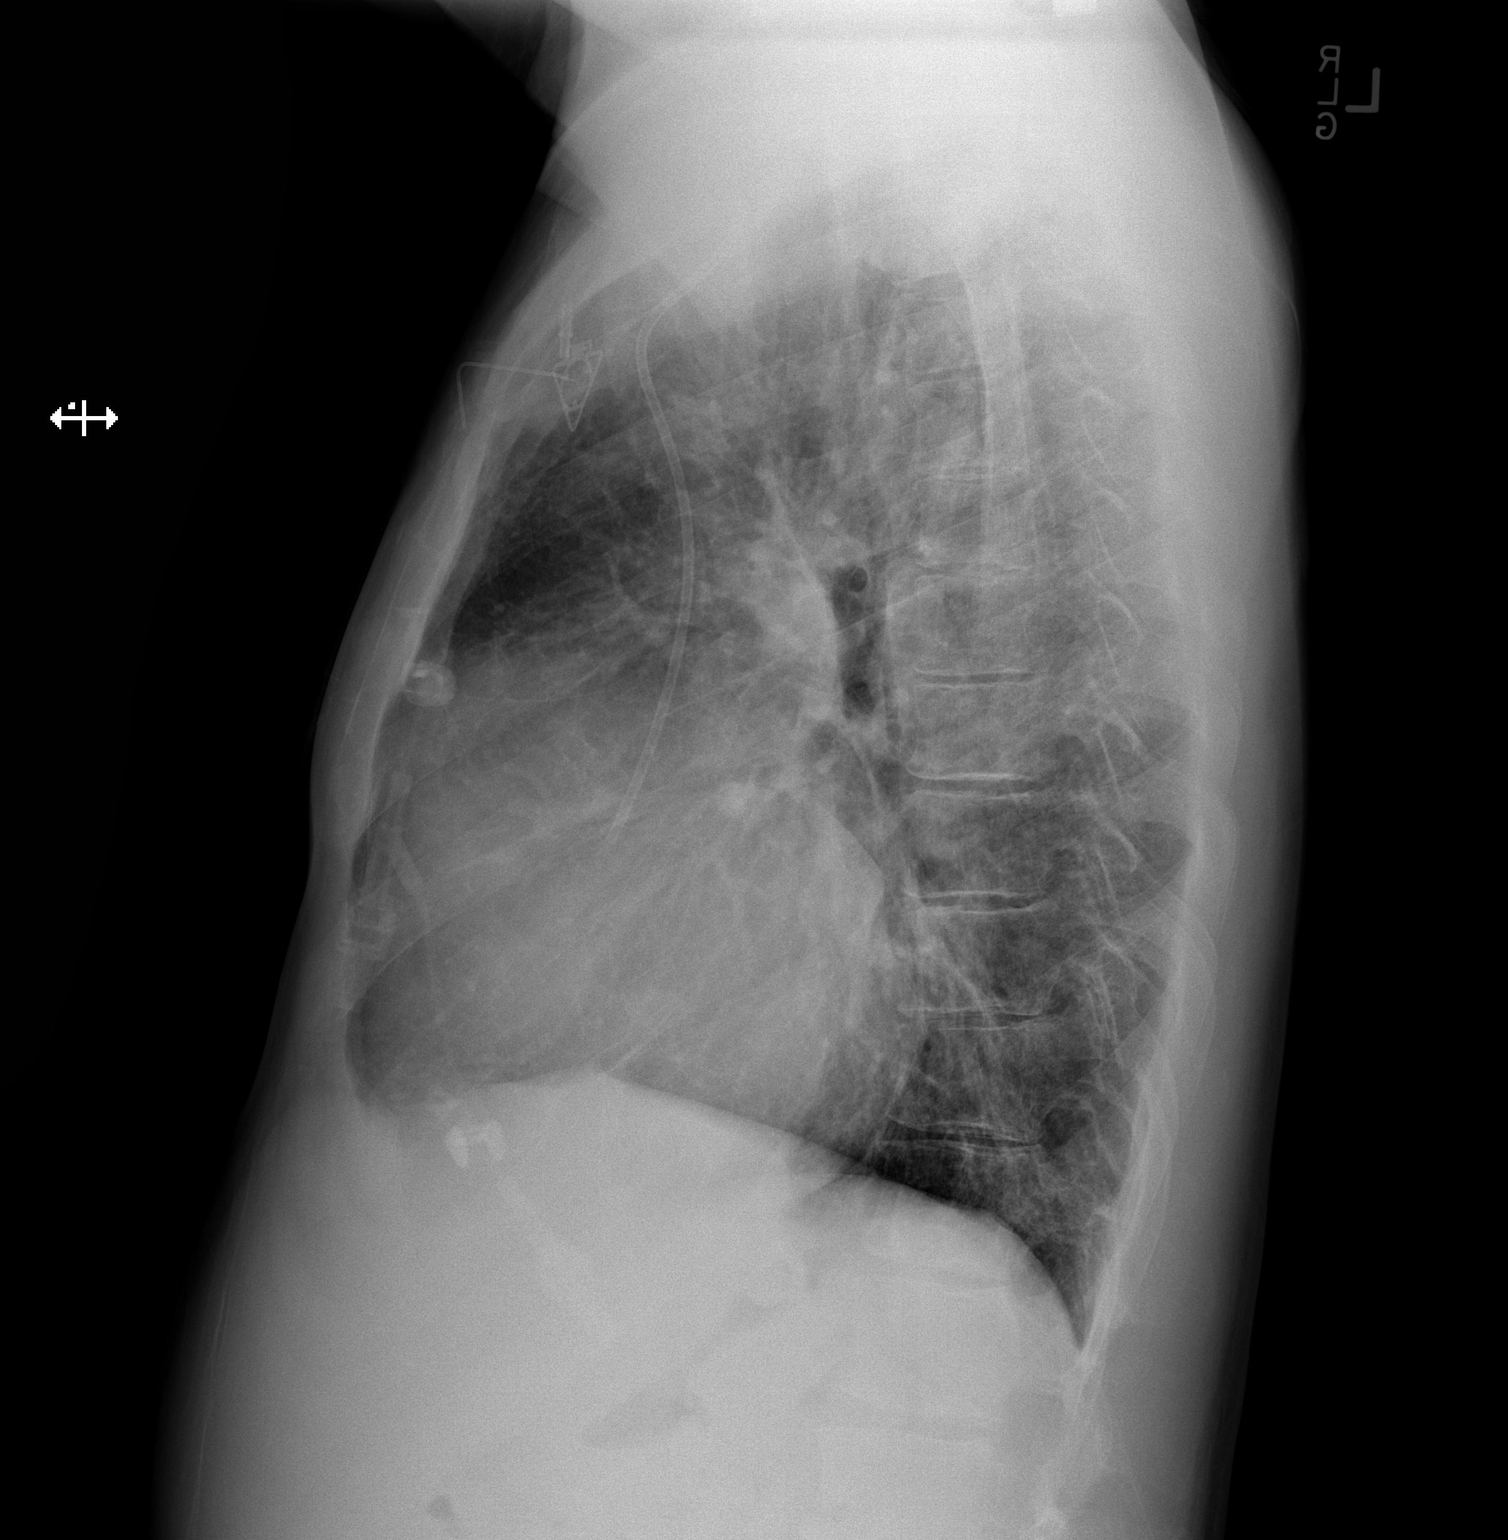

[2 of 2 positions shown; findings below may reference images not displayed]

FINDINGS: There is no focal consolidation. There is mild bilateral
interstitial thickening. There is no pleural effusion or
pneumothorax. There is stable cardiomegaly. There is a left-sided
Port-A-Cath in satisfactory position.

The osseous structures are unremarkable.
IMPRESSION: Cardiomegaly with mild pulmonary vascular congestion.

## 2018-02-09 IMAGING — CR DG CHEST 2V
2 series · 2 of 2 positions shown · non-contrast
Comparison: 03/04/2015

CLINICAL DATA: Sickle cell crisis

EXAM:
CHEST  2 VIEW

[w chest pa]
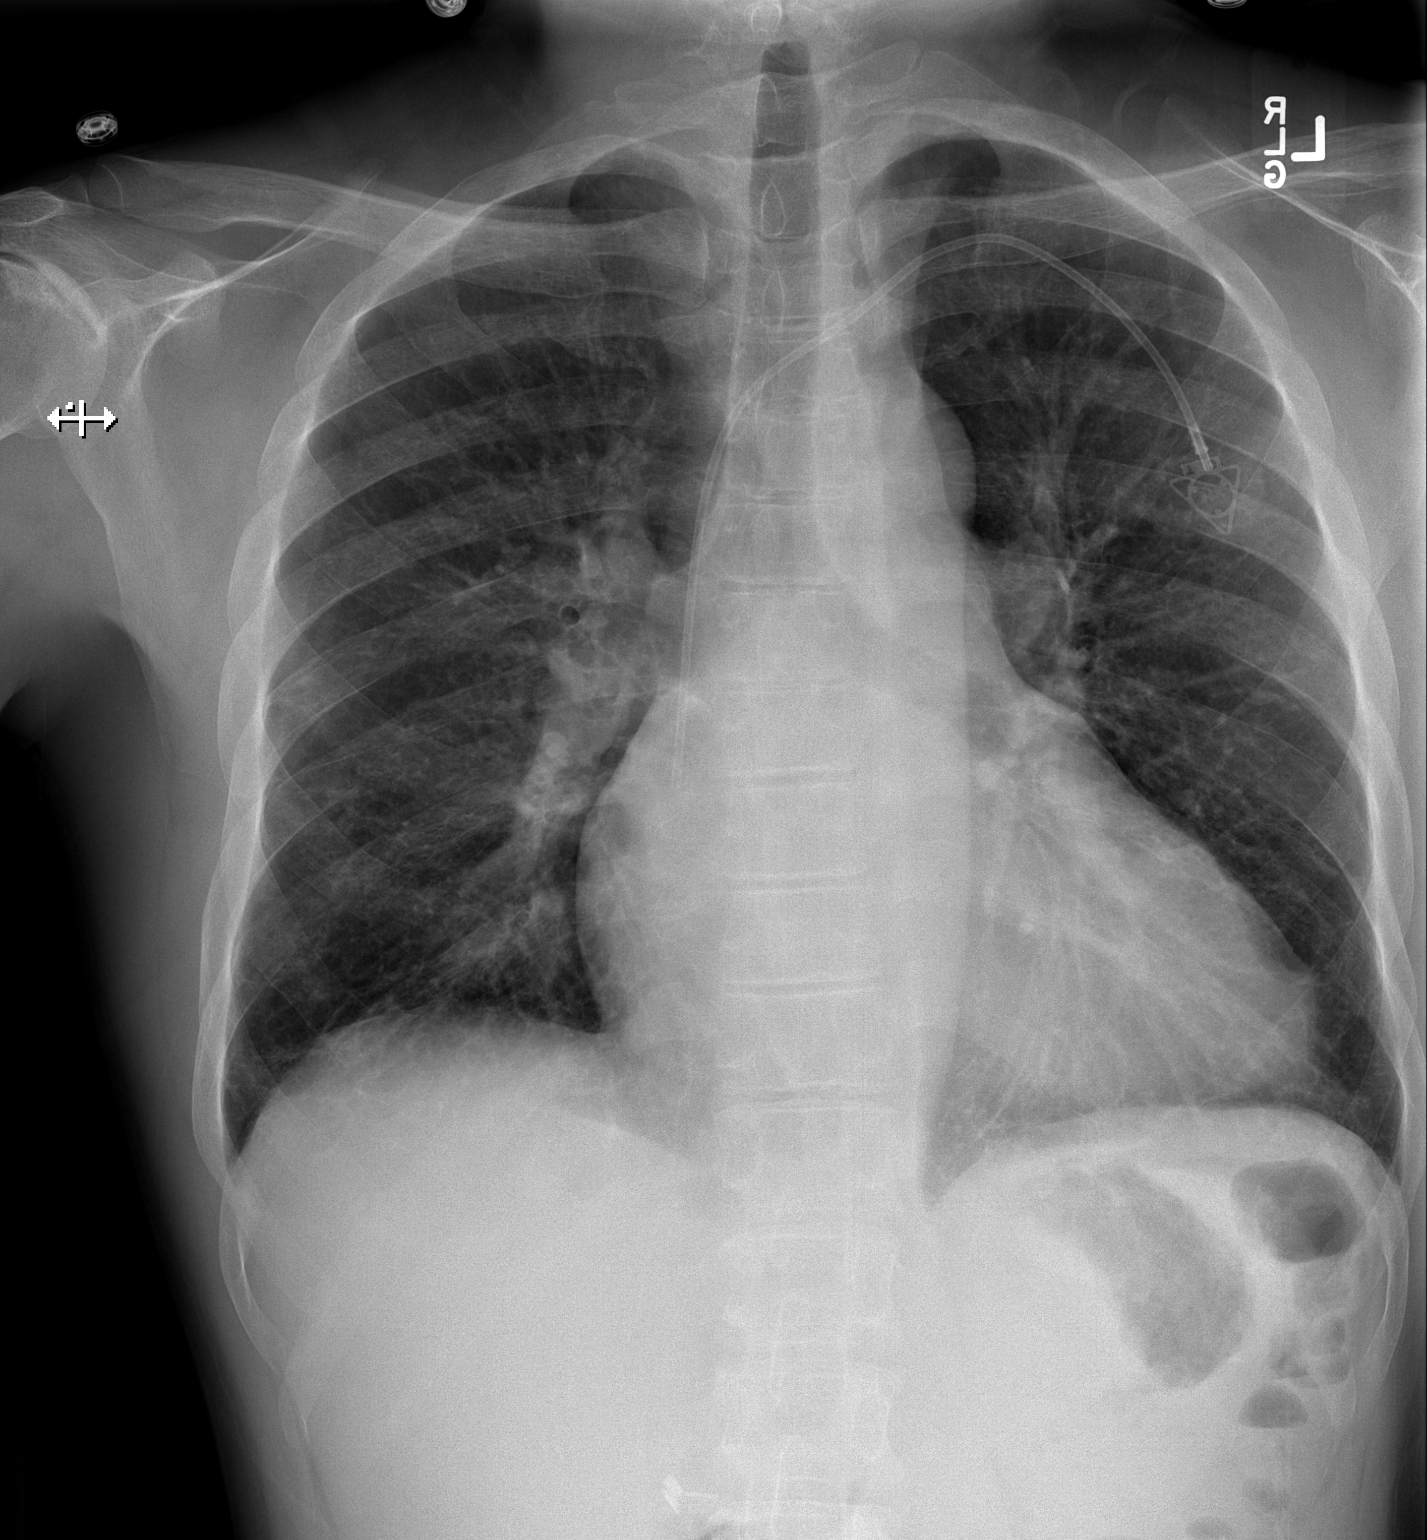

[w chest lat]
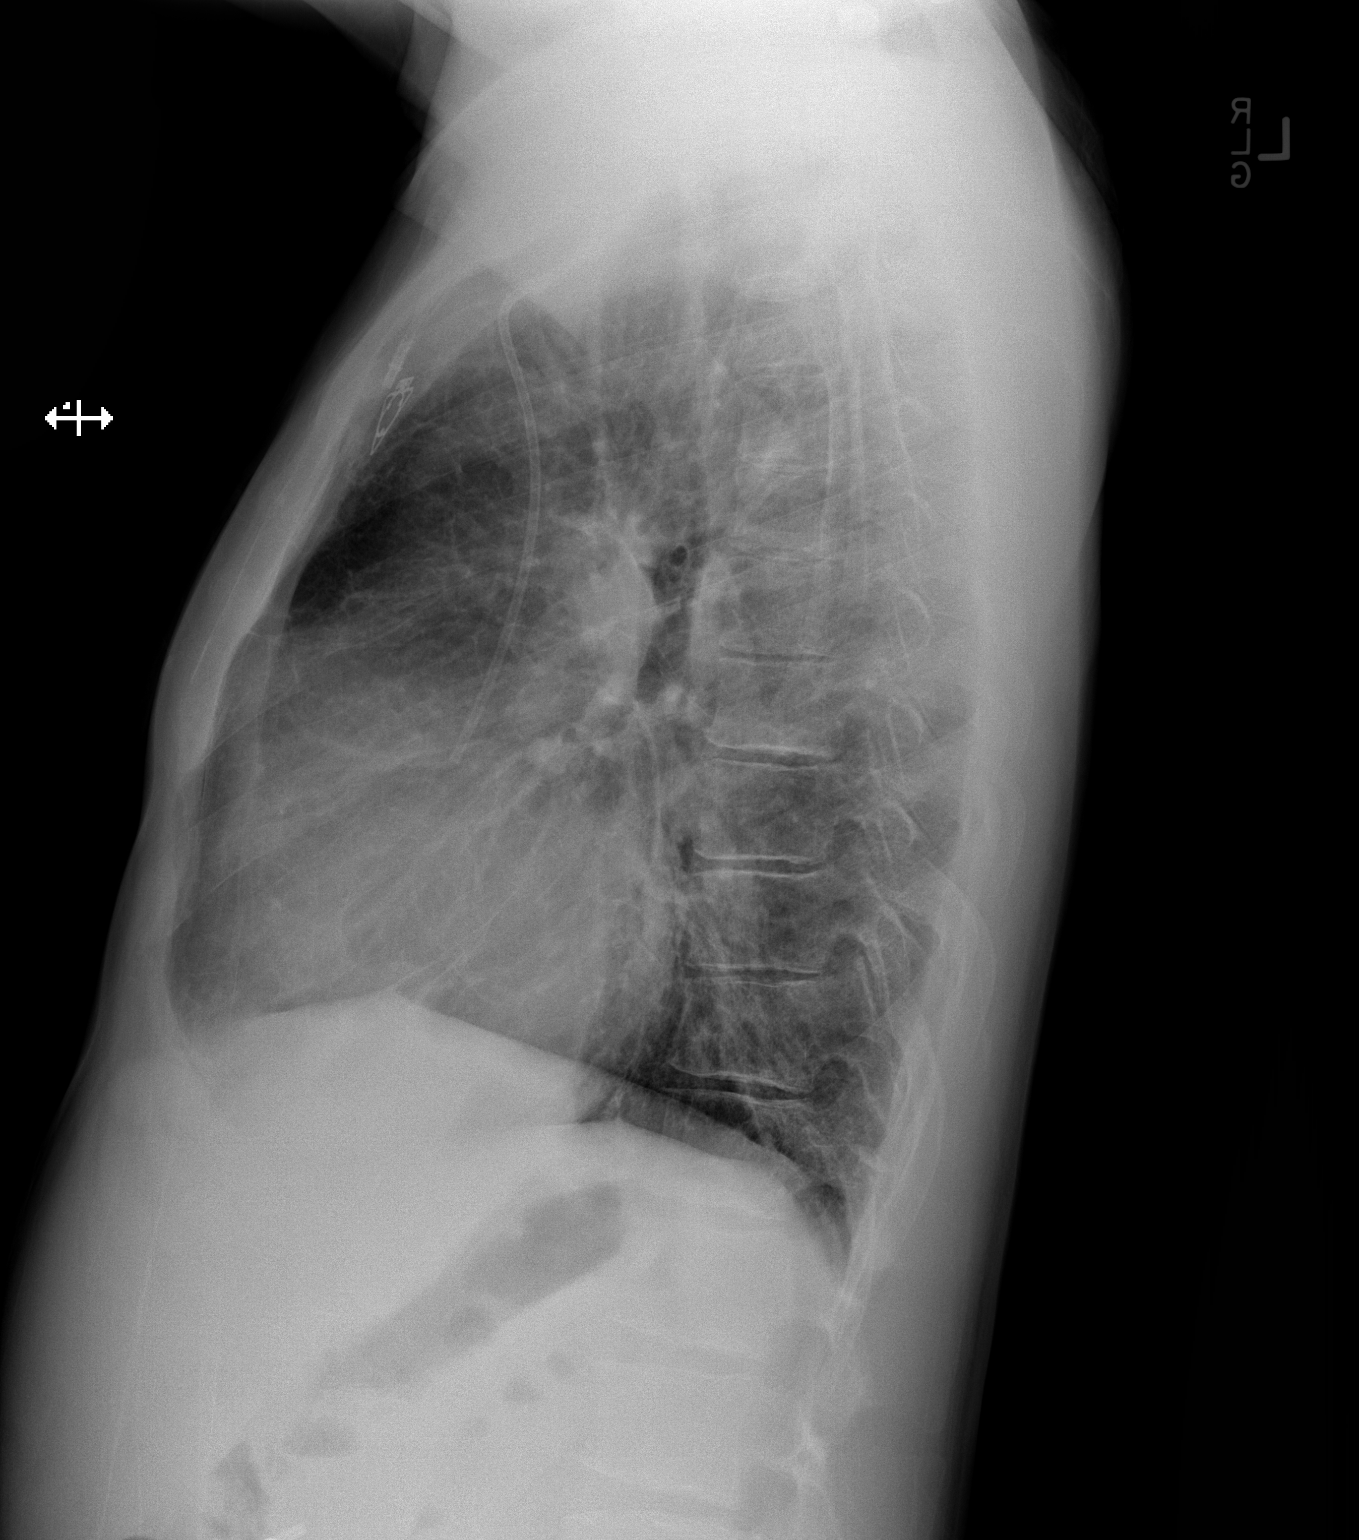

[2 of 2 positions shown; findings below may reference images not displayed]

FINDINGS: There is mild bilateral interstitial thickening. There is no focal
parenchymal opacity. There is no pleural effusion or pneumothorax.
There is stable cardiomegaly. There is a left-sided Port-A-Cath in
unchanged position.

The osseous structures are unremarkable.
IMPRESSION: No acute cardiopulmonary disease.

Cardiomegaly with mild pulmonary vascular congestion.

## 2018-03-05 NOTE — Telephone Encounter (Signed)
Closed for Admin purposes.

## 2018-03-17 IMAGING — CR DG CHEST 2V
2 series · 2 of 2 positions shown · non-contrast
Comparison: 03/19/2015

CLINICAL DATA: Sickle cell crisis with chest pain, initial
encounter

EXAM:
CHEST  2 VIEW

[w chest pa]
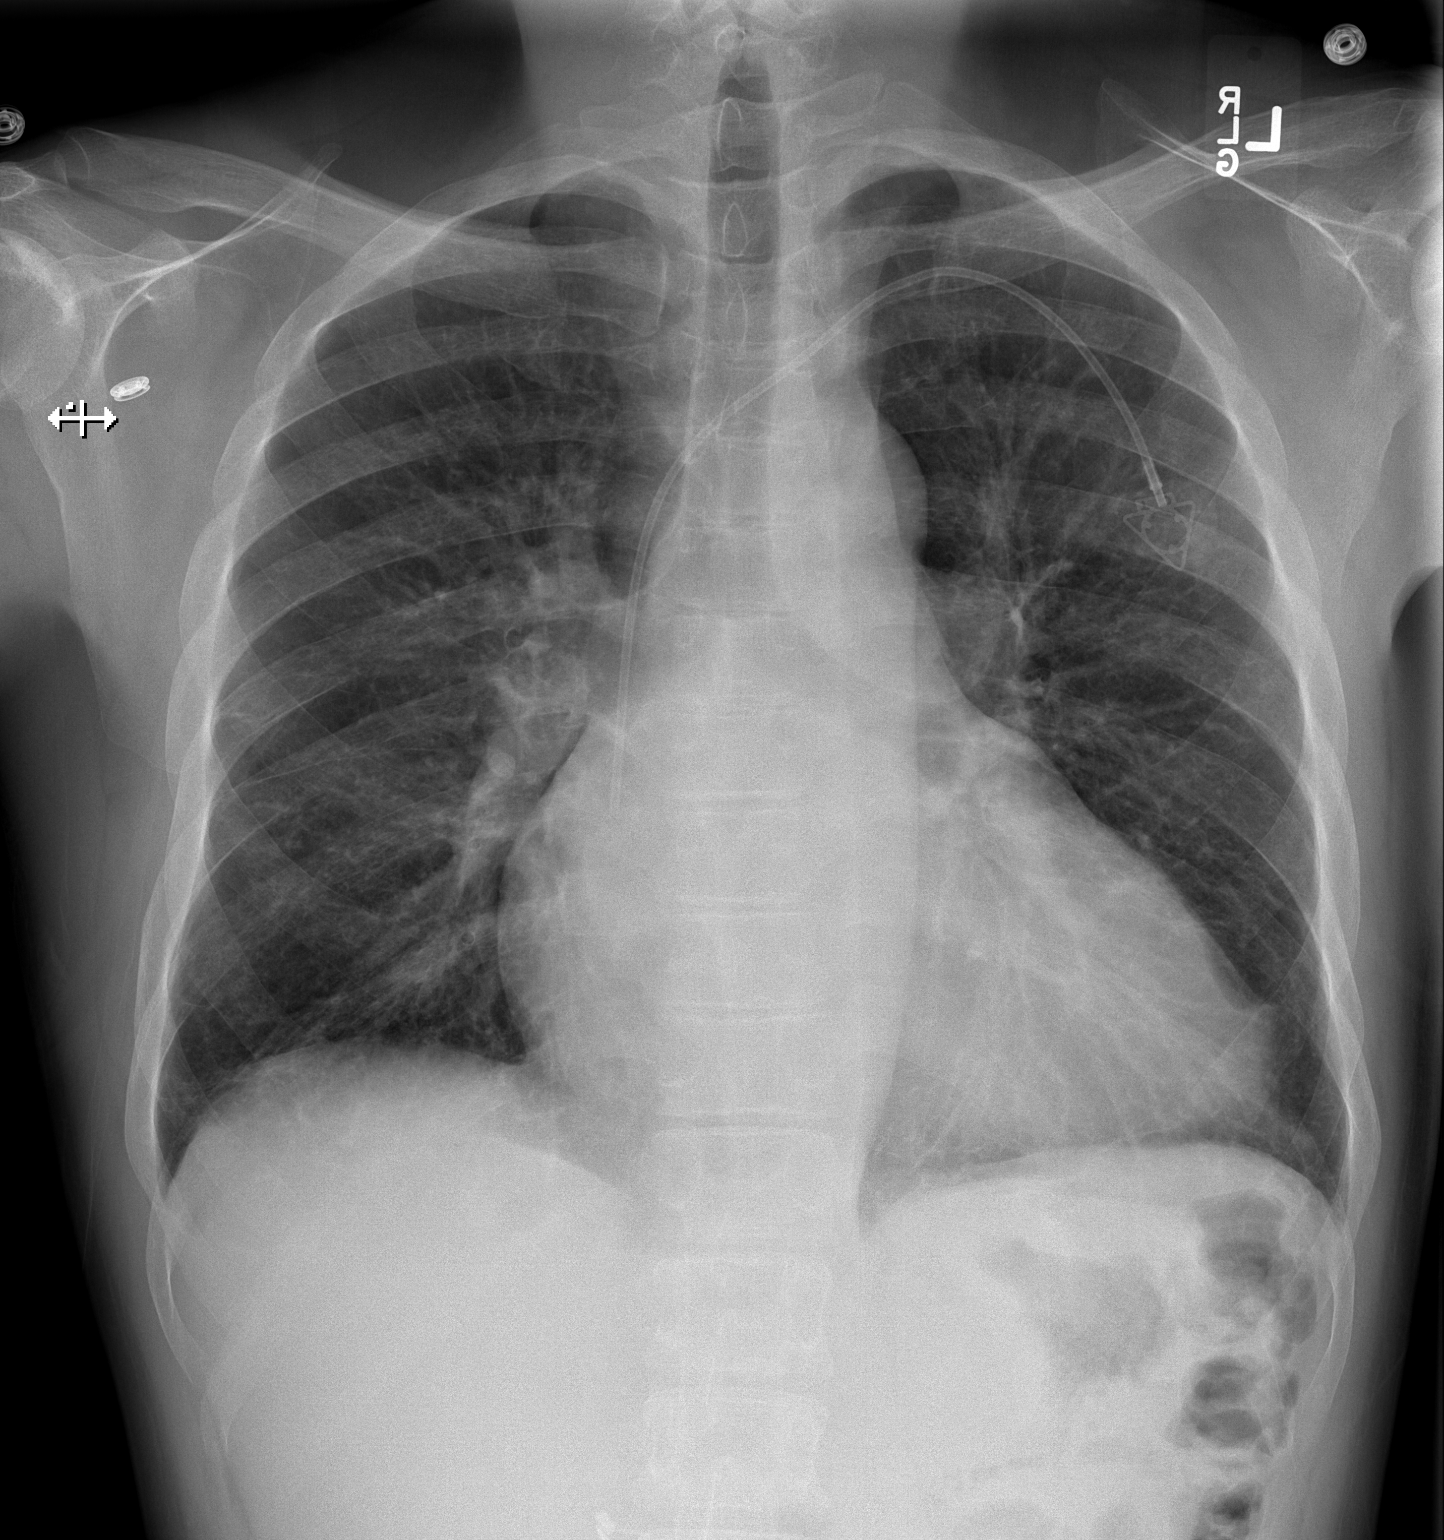

[w chest lat]
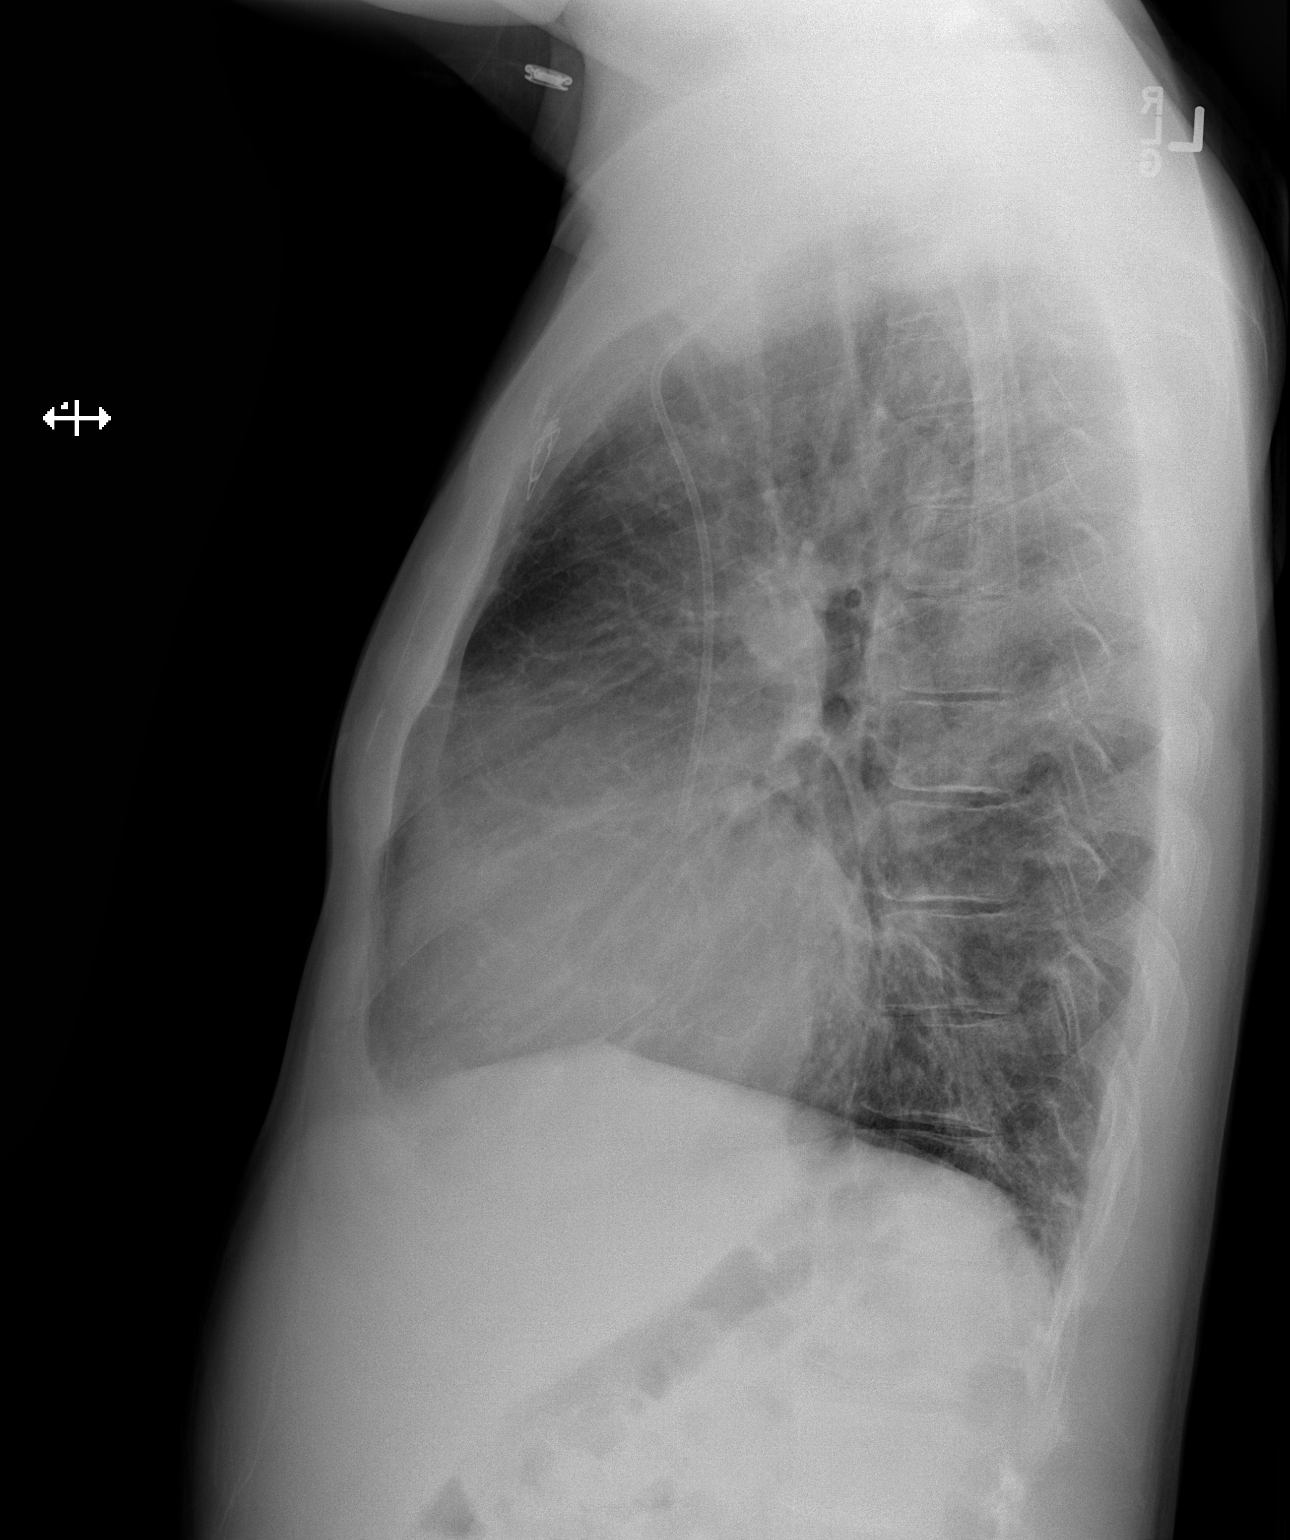

[2 of 2 positions shown; findings below may reference images not displayed]

FINDINGS: Cardiac shadow is enlarged. A left chest wall port is again seen and
stable. The lungs are well aerated bilaterally. No acute bony
abnormality is seen.
IMPRESSION: No acute abnormality noted.

## 2018-03-19 IMAGING — CR DG CHEST 2V
2 series · 2 of 2 positions shown · non-contrast
Comparison: 04/24/2015.

CLINICAL DATA: BILATERAL flank pain for 3 days. Sickle cell crisis.

EXAM:
CHEST  2 VIEW

[w chest pa]
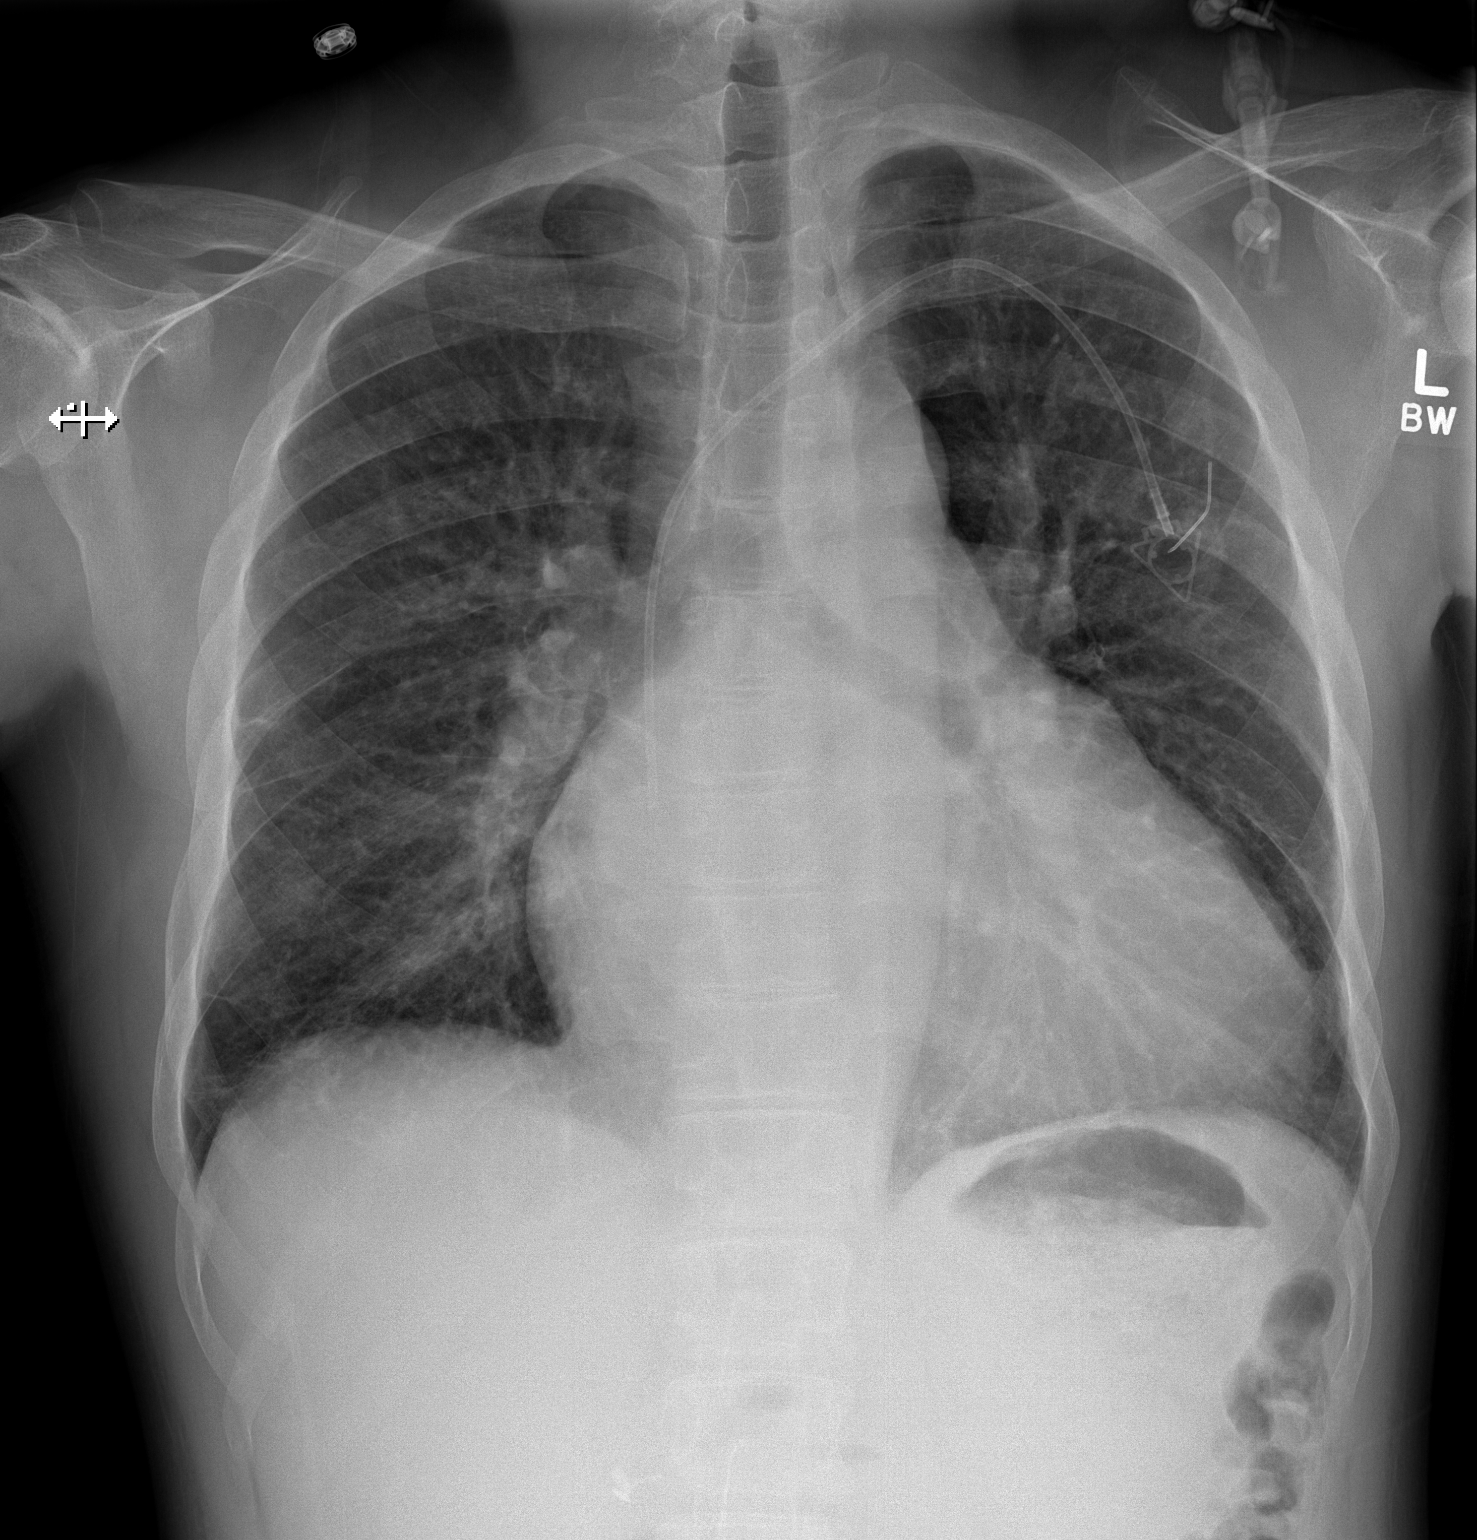

[w chest lat]
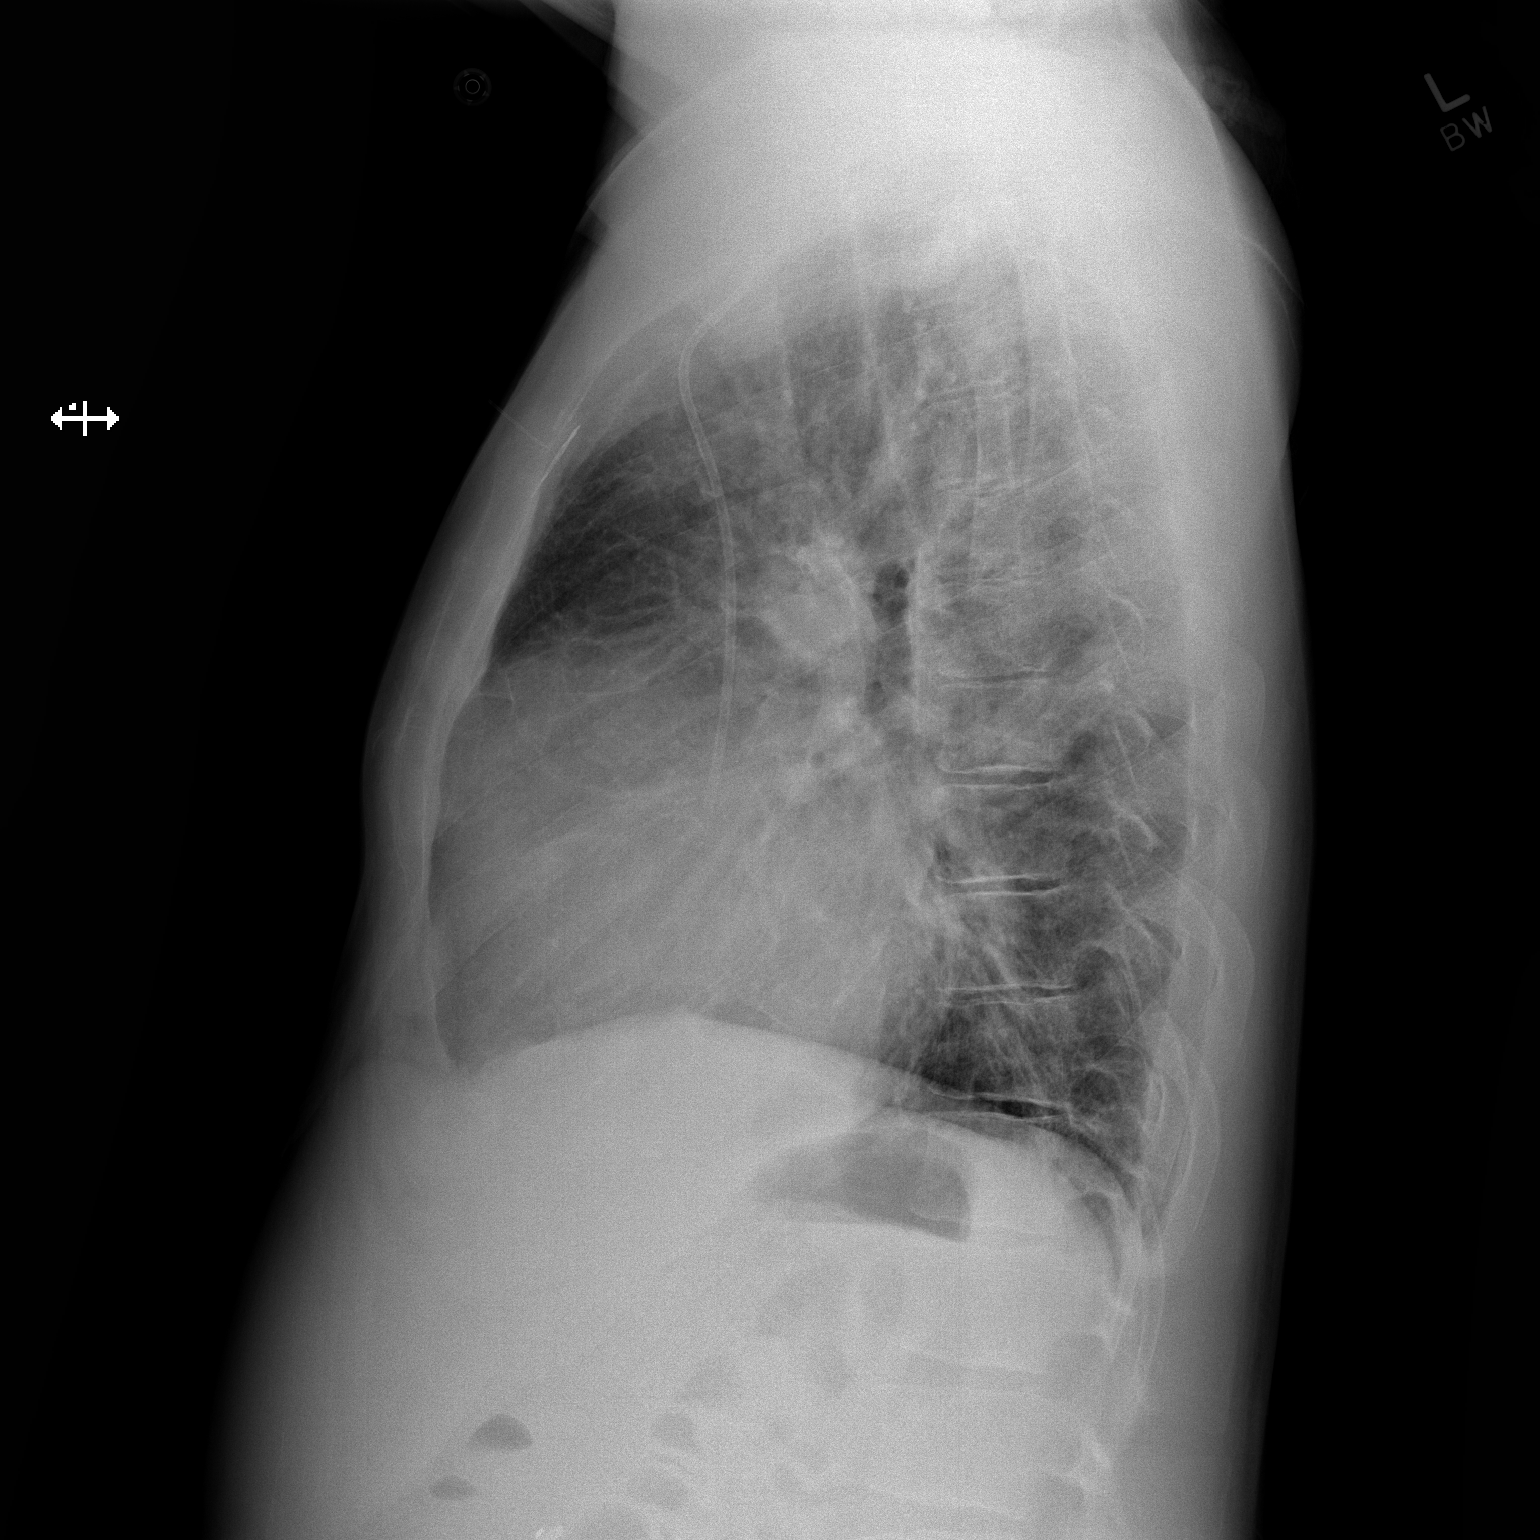

[2 of 2 positions shown; findings below may reference images not displayed]

FINDINGS: Cardiomegaly. Port-A-Cath unchanged, tip in RIGHT atrium. Mild
vascular congestion. No infiltrates, or effusion. Sickle cell
changes in the visualized osseous structures.
IMPRESSION: Cardiomegaly.  No acute abnormality noted.

## 2018-03-20 IMAGING — US US RENAL
1 series · 14 of 25 positions shown · non-contrast
Comparison: CT abdomen and pelvis December 15, 2015

CLINICAL DATA: Renal failure.  Sickle cell disease

EXAM:
RENAL / URINARY TRACT ULTRASOUND COMPLETE

[Series 1: us renal · 0.23mm/px · 14 of 41 slices shown]
[im 1/41]
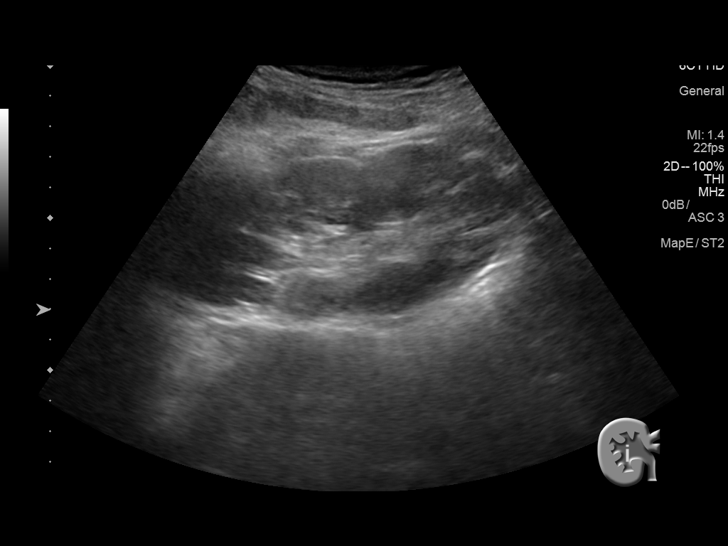
[im 4/41]
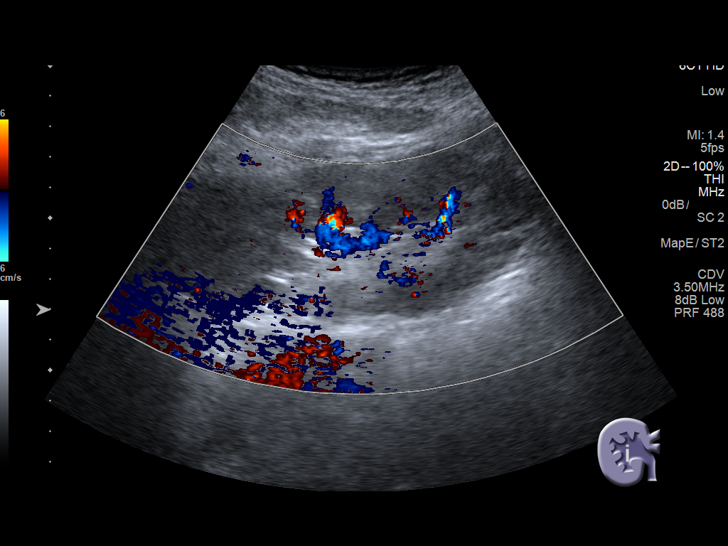
[im 7/41]
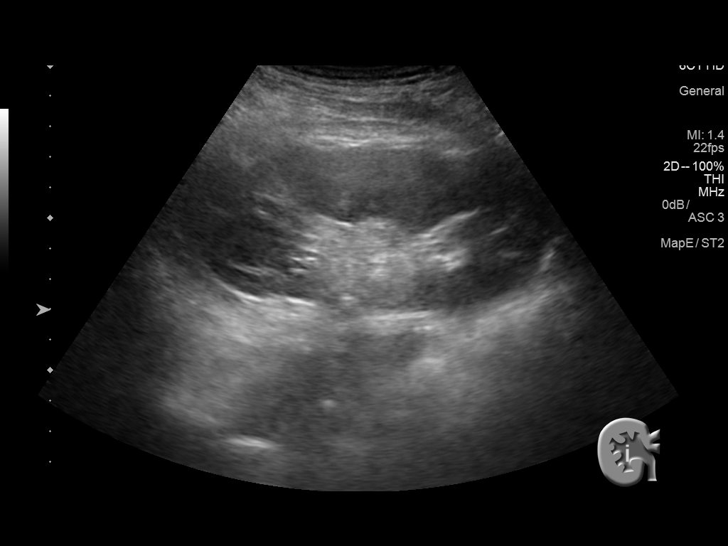
[im 11/41]
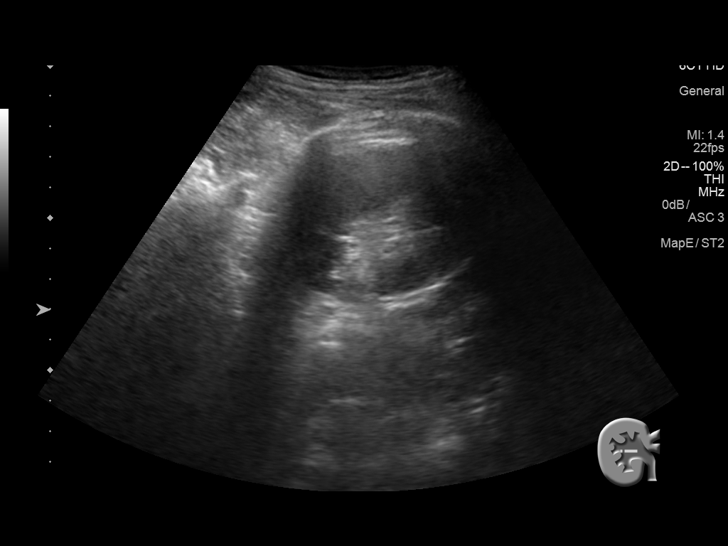
[im 14/41]
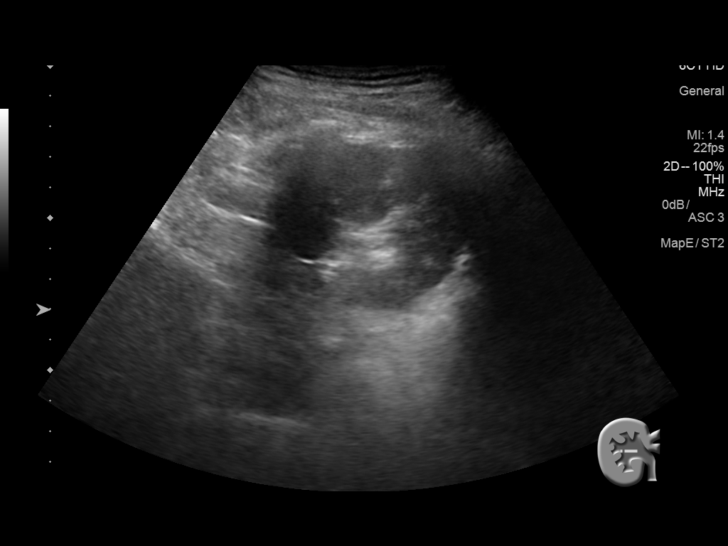
[im 16/41]
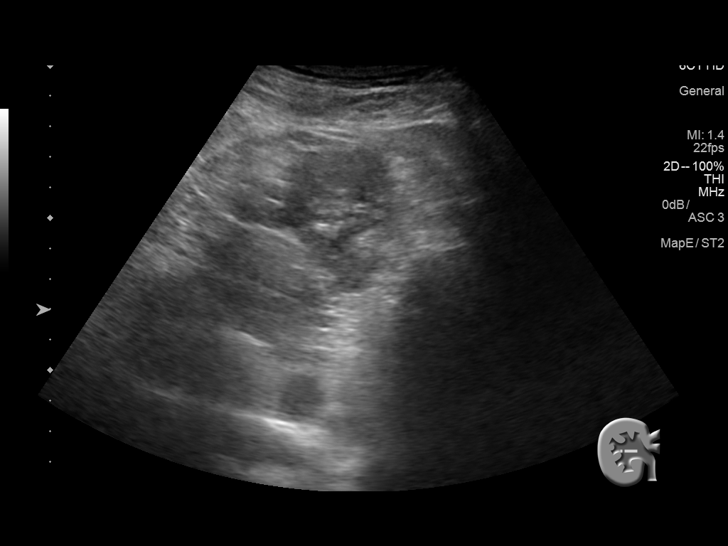
[im 19/41]
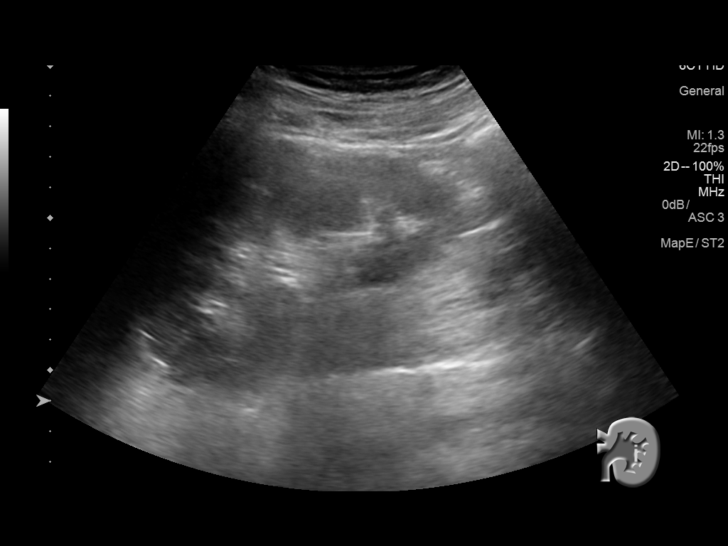
[im 22/41]
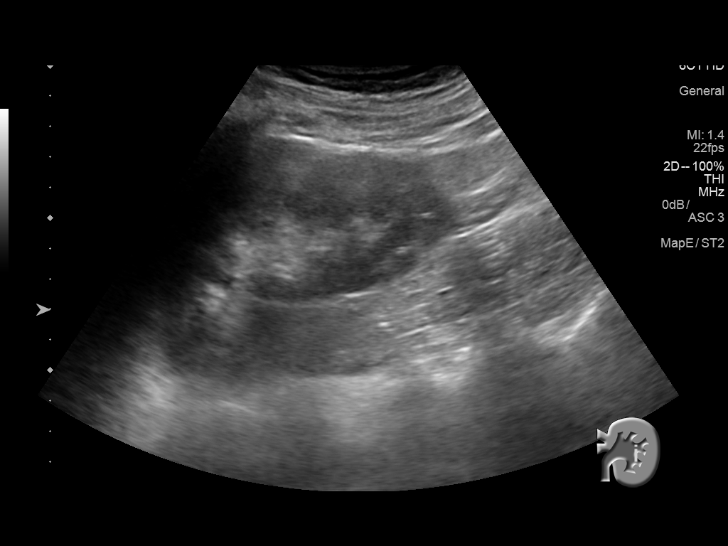
[im 26/41]
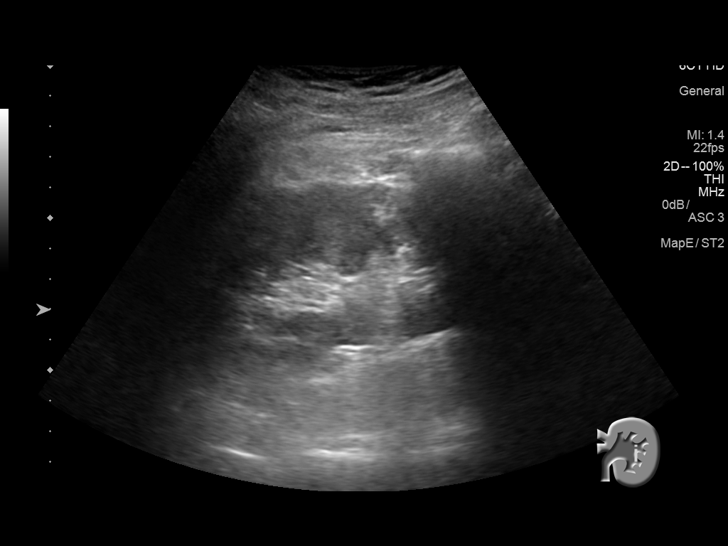
[im 27/41]
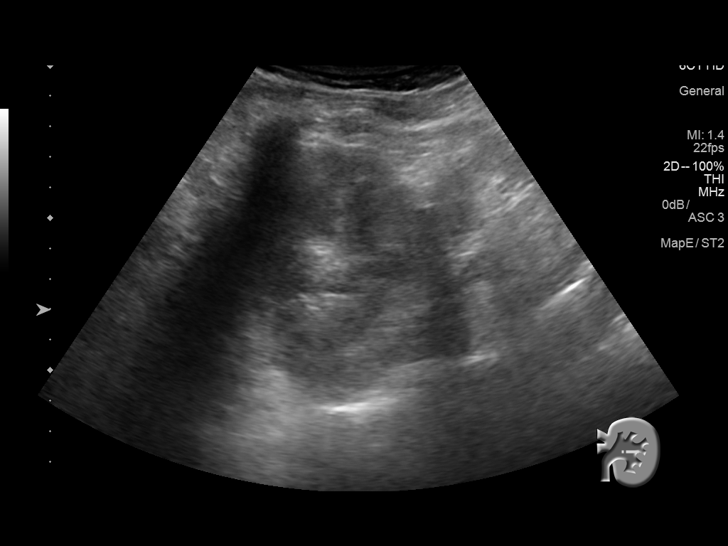
[im 31/41]
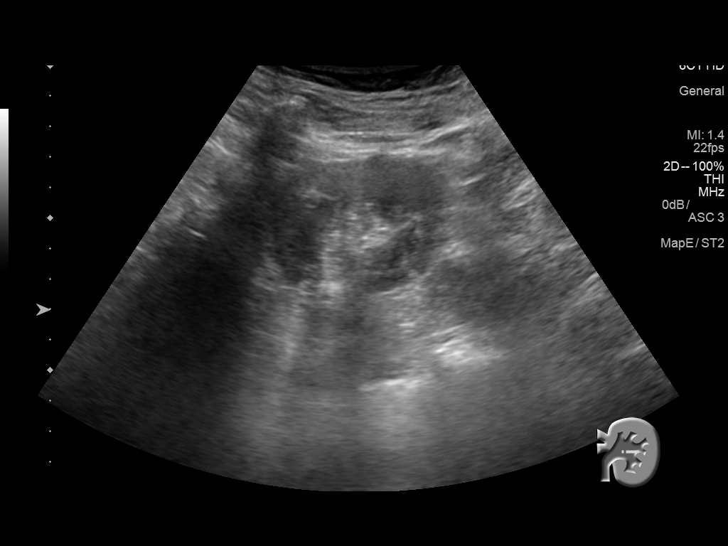
[im 34/41]
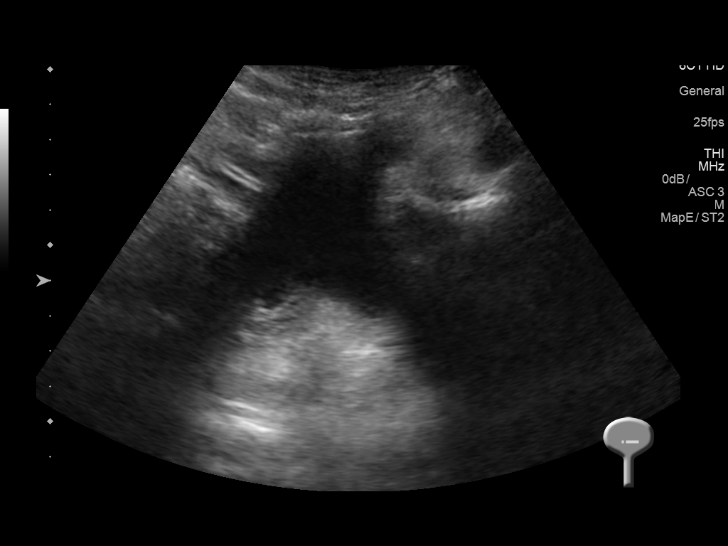
[im 37/41]
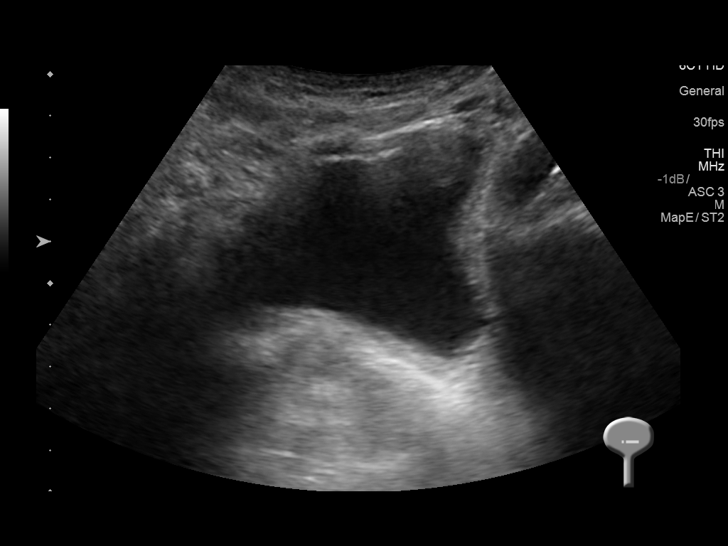
[im 41/41]
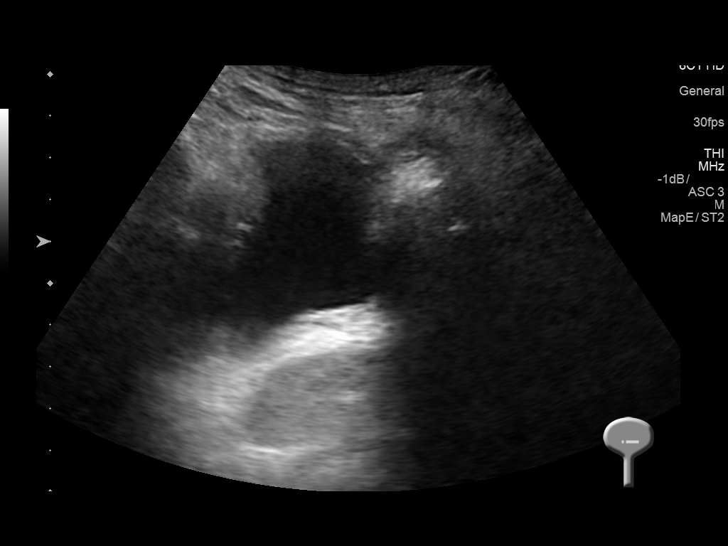

[14 of 25 positions shown; findings below may reference images not displayed]

FINDINGS: Right Kidney:

Length: 11.5 cm. Echogenicity and renal cortical thickness are
within normal limits. No mass, perinephric fluid, or hydronephrosis
visualized. No sonographically demonstrable calculus or
ureterectasis.

Left Kidney:

Length: 11.7 cm. Echogenicity and renal cortical thickness are
within normal limits. No mass, perinephric fluid, or hydronephrosis
visualized. No sonographically demonstrable calculus or
ureterectasis.

Bladder:

Appears normal for degree of bladder distention.
IMPRESSION: Study within normal limits.

## 2018-03-30 IMAGING — CR DG CHEST 2V
2 series · 2 of 2 positions shown · non-contrast
Comparison: 04/26/2015

CLINICAL DATA: Sickle cell pain crisis, chest pain

EXAM:
CHEST  2 VIEW

[w chest pa]
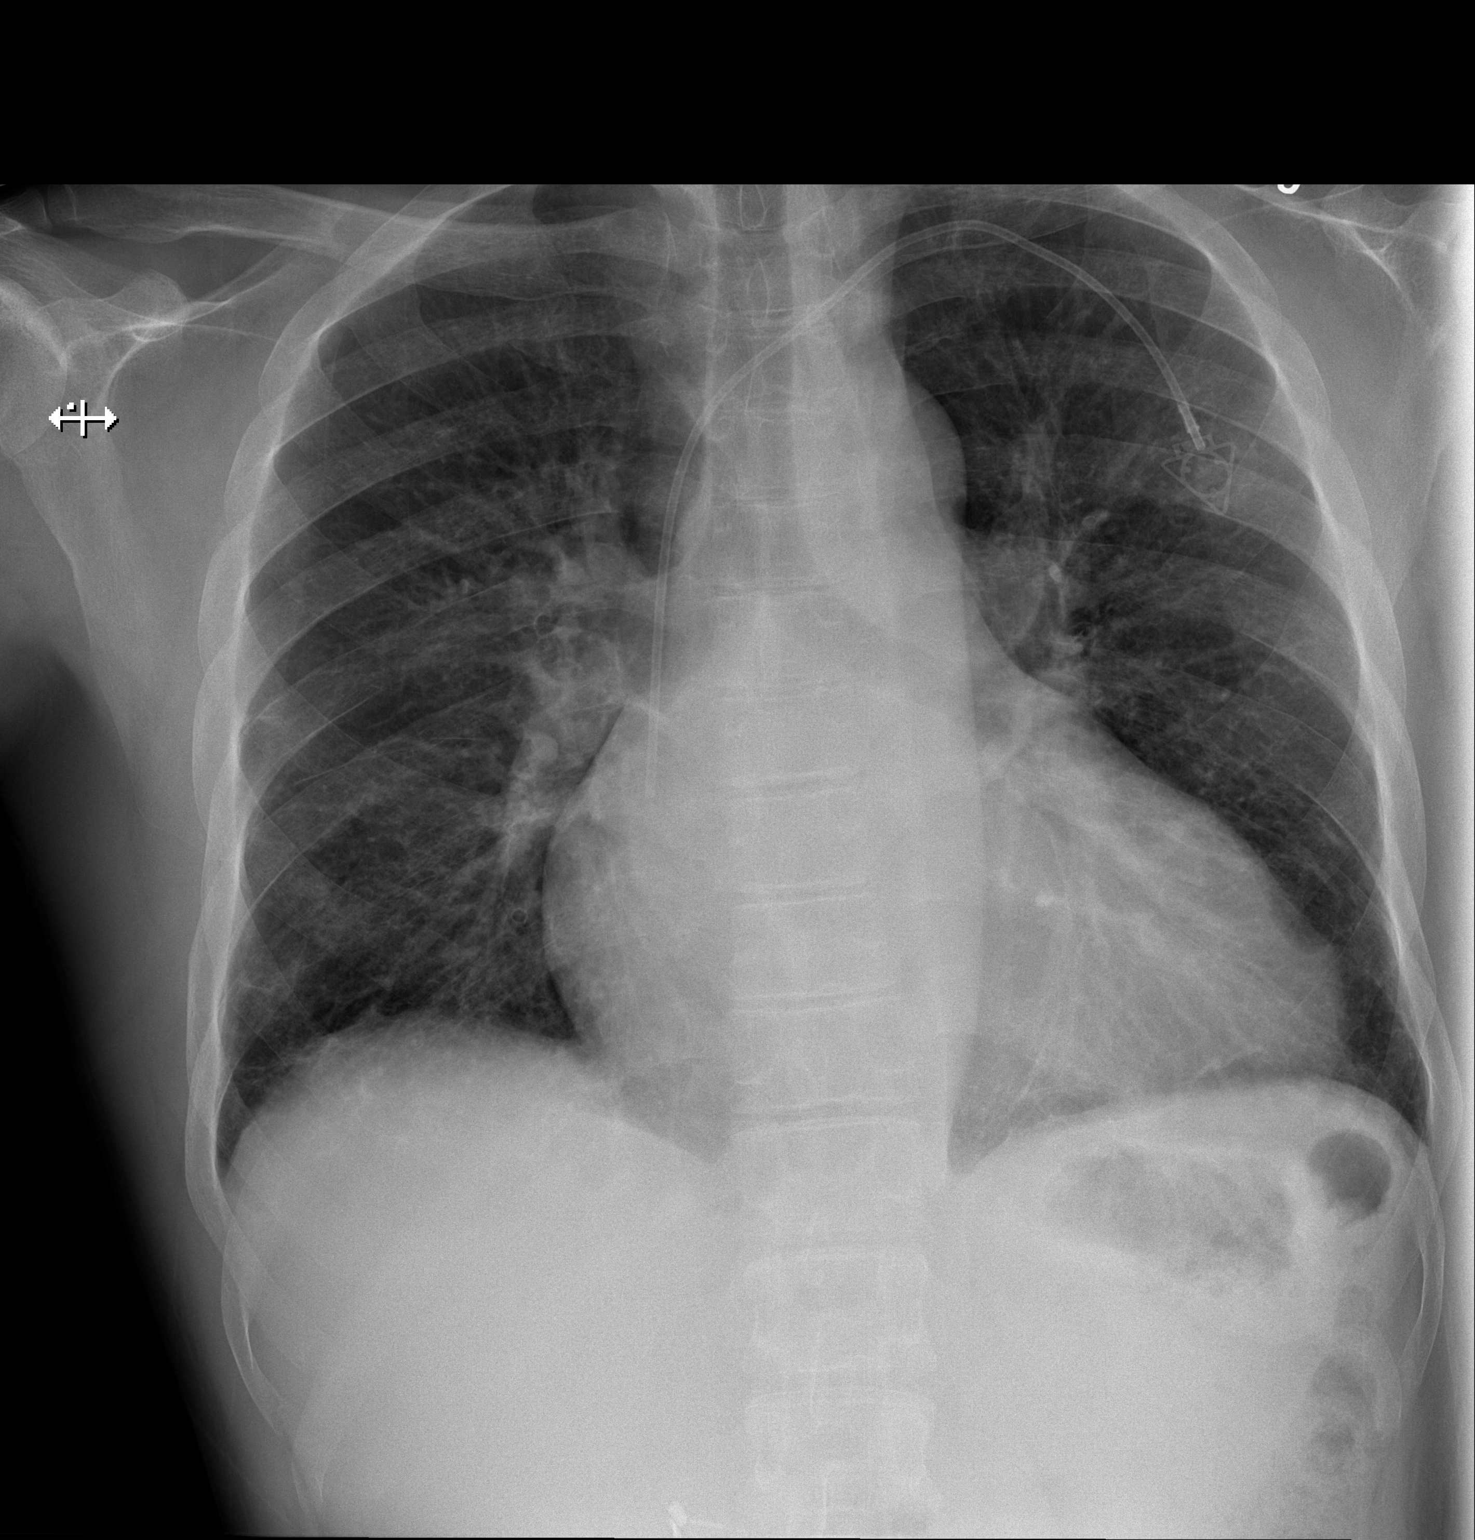

[w chest lat]
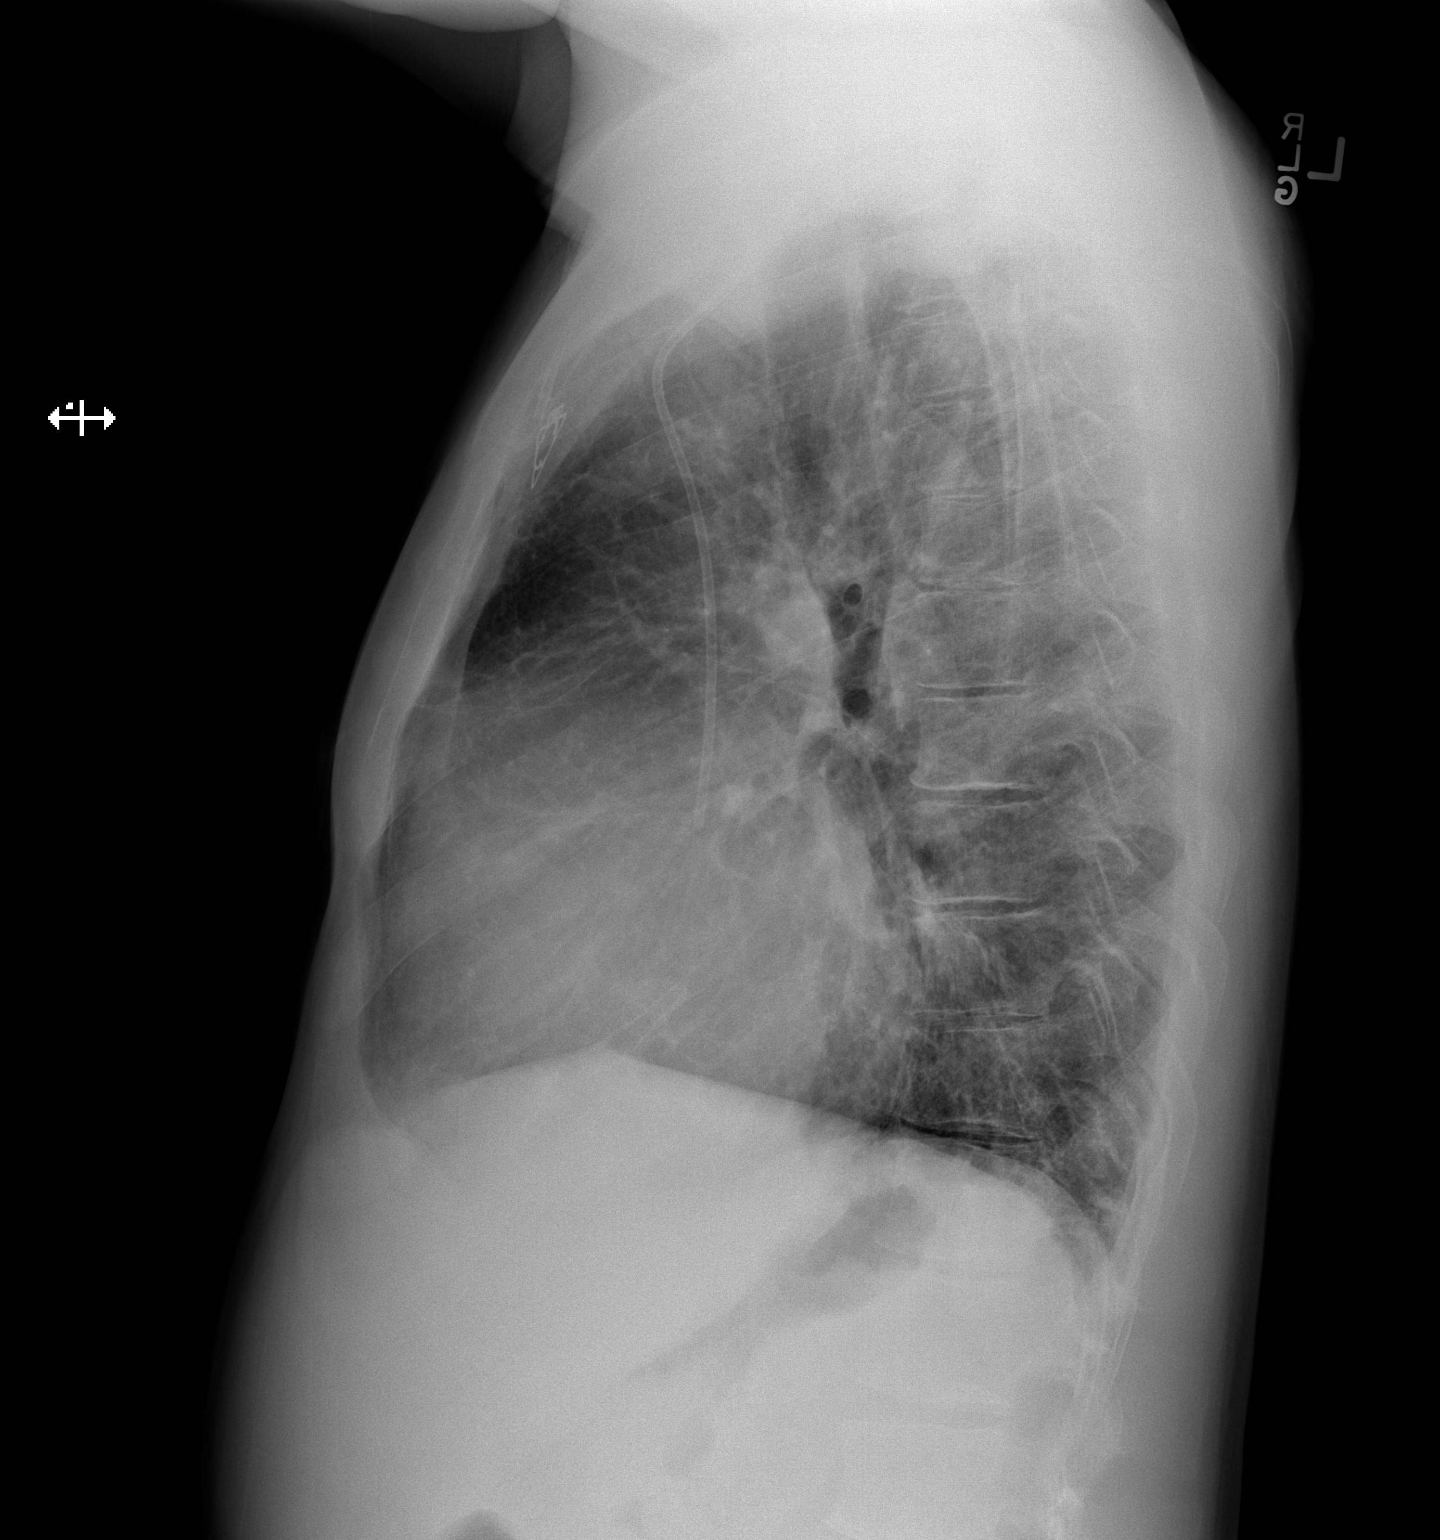

[2 of 2 positions shown; findings below may reference images not displayed]

FINDINGS: Left subclavian power port catheter tip SVC RA junction. Stable
cardiomegaly with vascular congestion and basilar atelectasis/
parenchymal scarring. No superimposed pneumonia, collapse or
consolidation. No edema, effusion or pneumothorax. Trachea midline.
IMPRESSION: Stable cardiomegaly with vascular congestion and parenchymal
scarring. No superimposed acute process.

## 2018-04-24 IMAGING — CR DG CHEST 2V
2 series · 2 of 2 positions shown · non-contrast
Comparison: 05/07/2015

CLINICAL DATA: Rib cage pain and mid back pain. Sickle cell crisis.

EXAM:
CHEST  2 VIEW

[w chest pa]
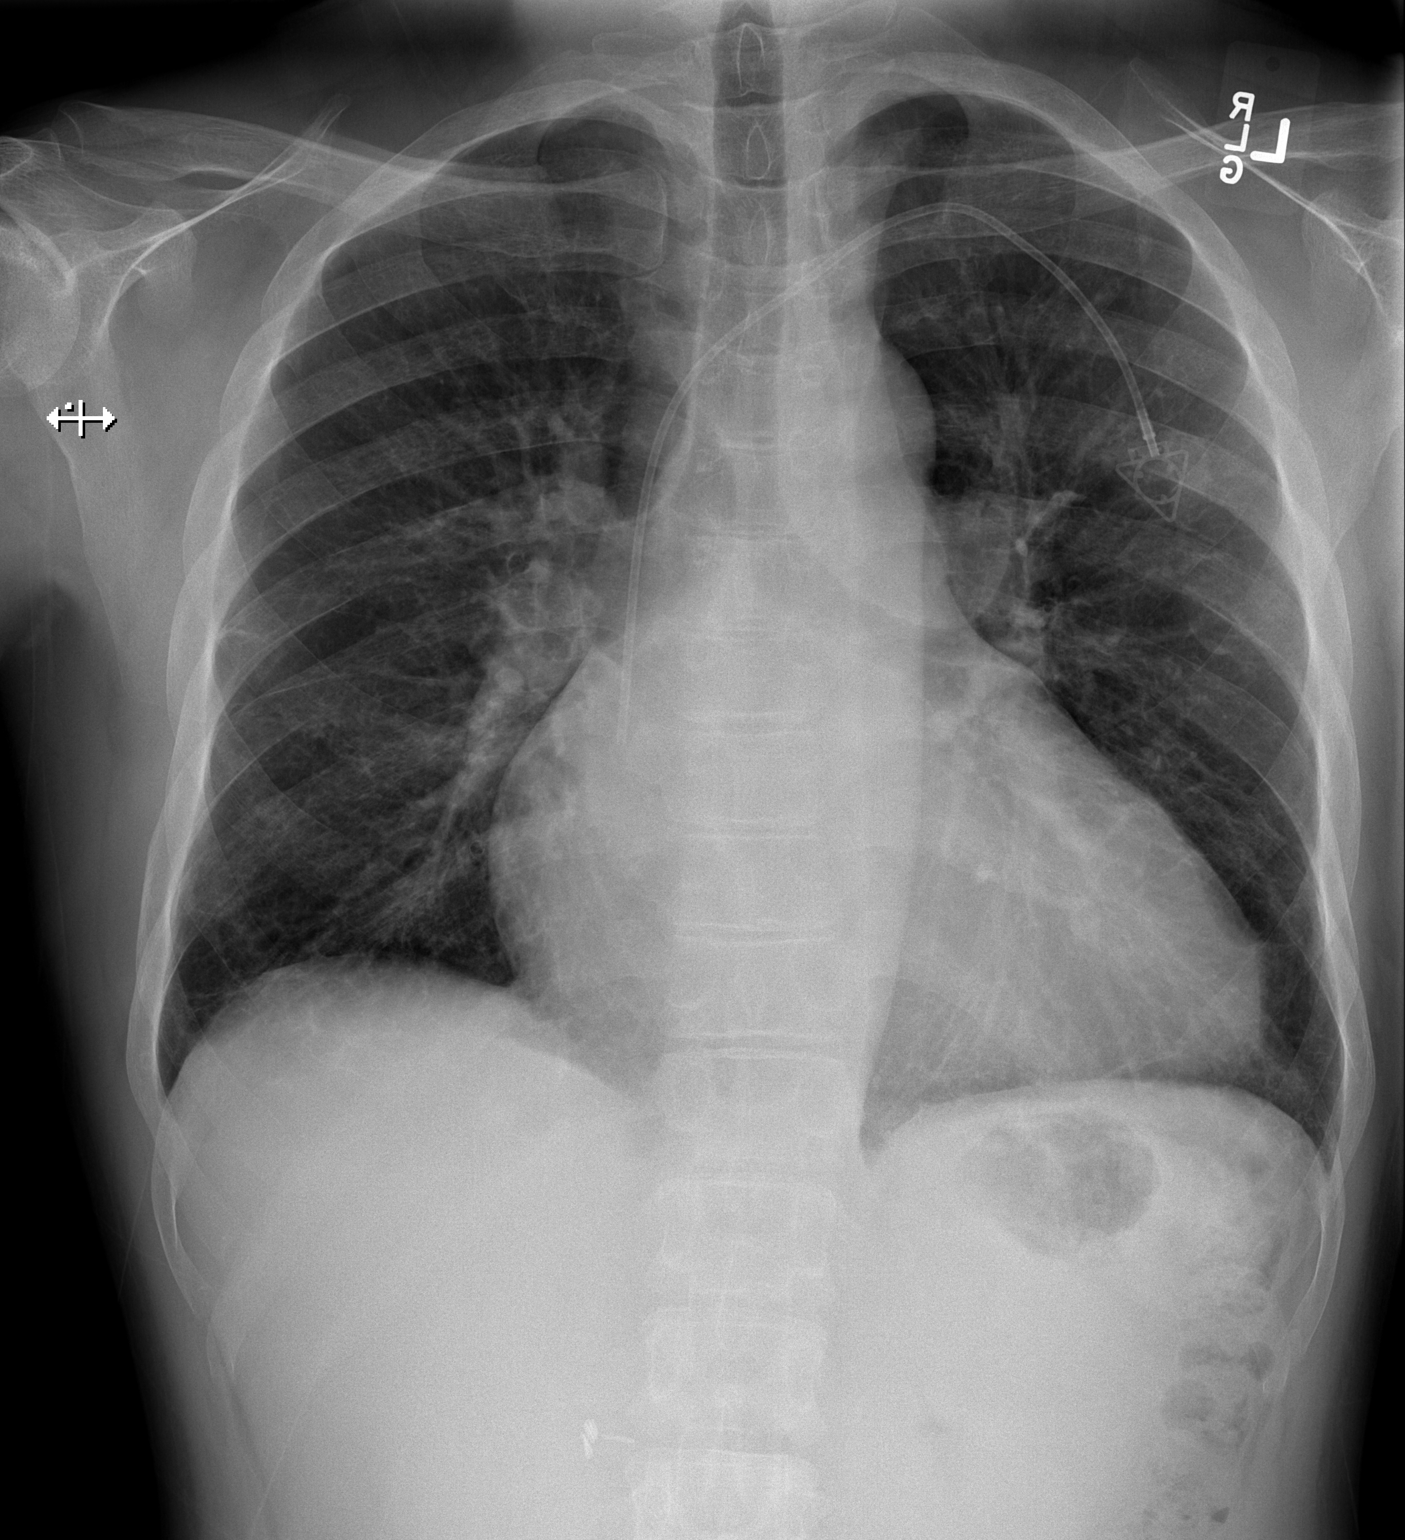

[w chest lat]
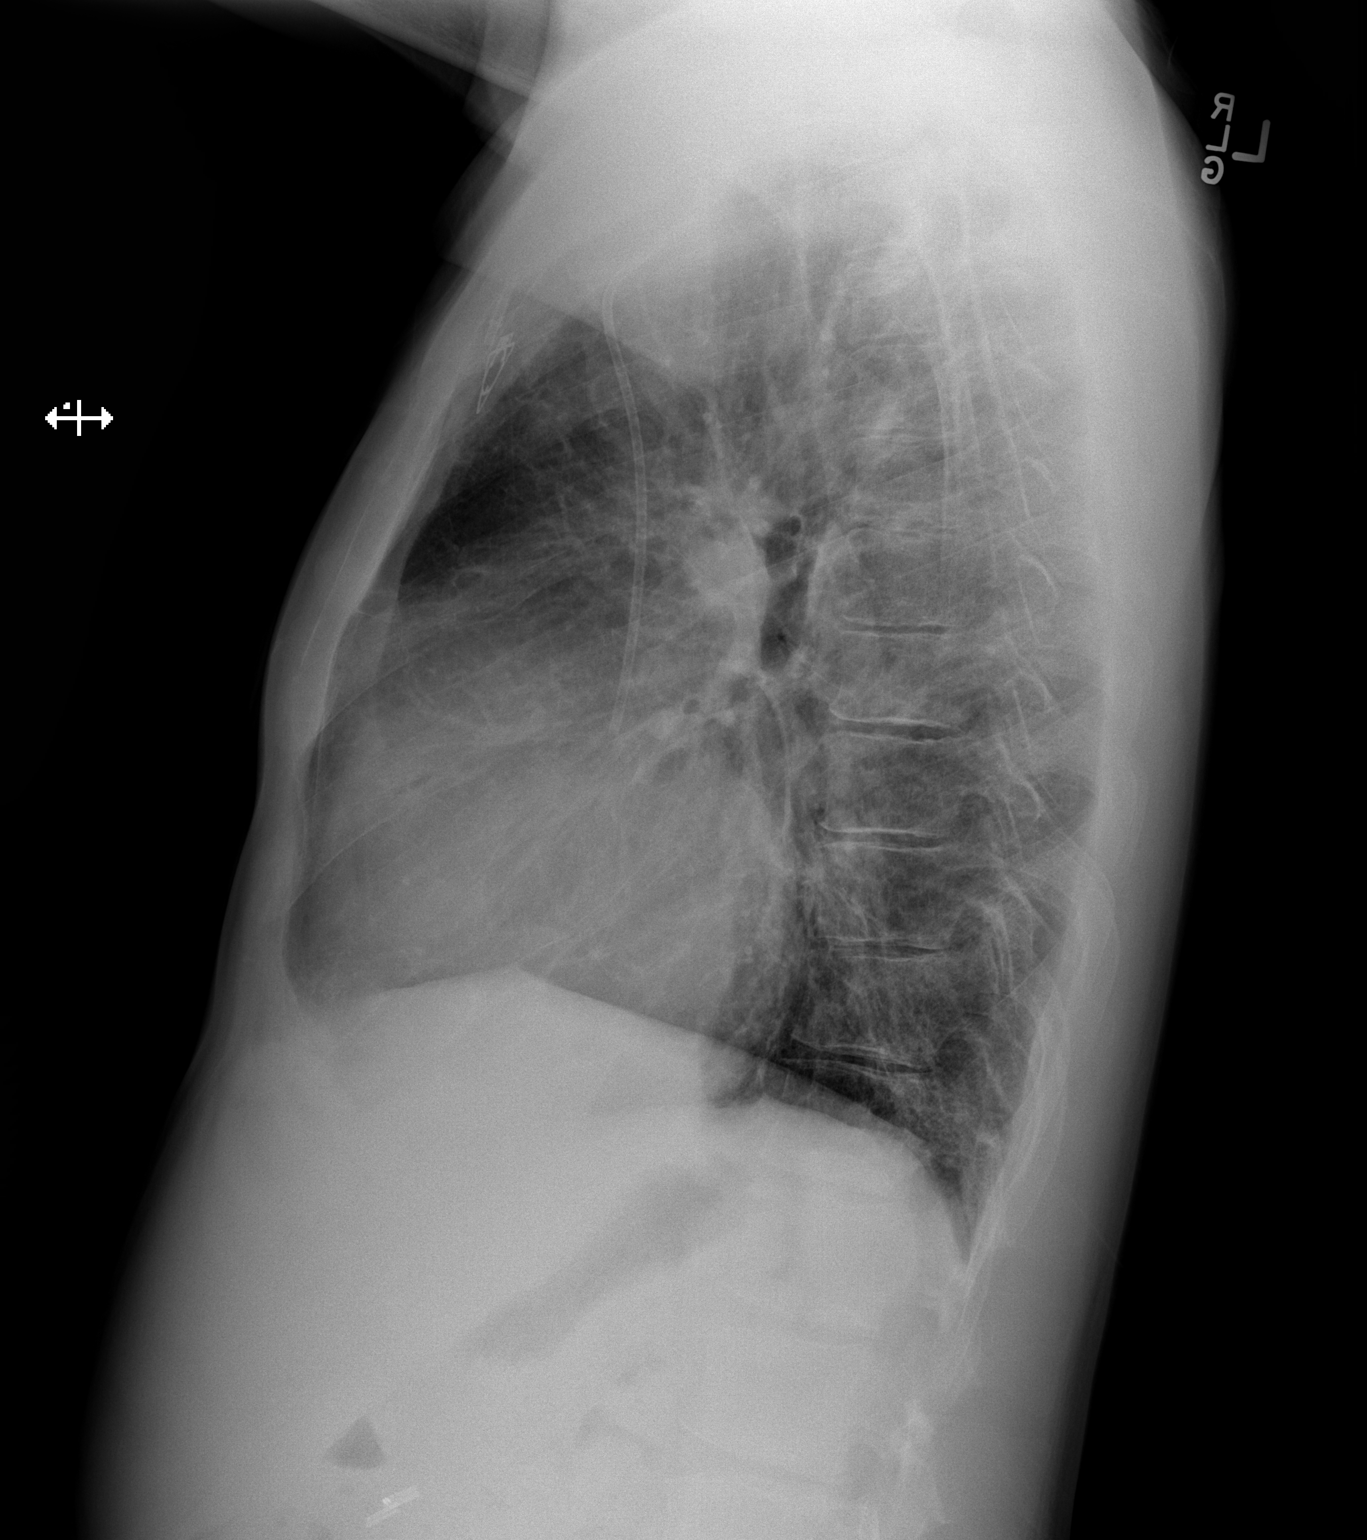

[2 of 2 positions shown; findings below may reference images not displayed]

FINDINGS: Chronic cardiomegaly. Pulmonary vascularity is normal. No
infiltrates or effusions. Power port in place. No bone abnormality.
IMPRESSION: No acute abnormality.  Chronic cardiomegaly.

## 2018-04-25 IMAGING — CR DG CHEST 2V
2 series · 2 of 2 positions shown · non-contrast
Comparison: 06/01/2015

CLINICAL DATA: Sickle cell anemia. Left-sided chest pain for 1 day.

EXAM:
CHEST  2 VIEW

[w chest lat]
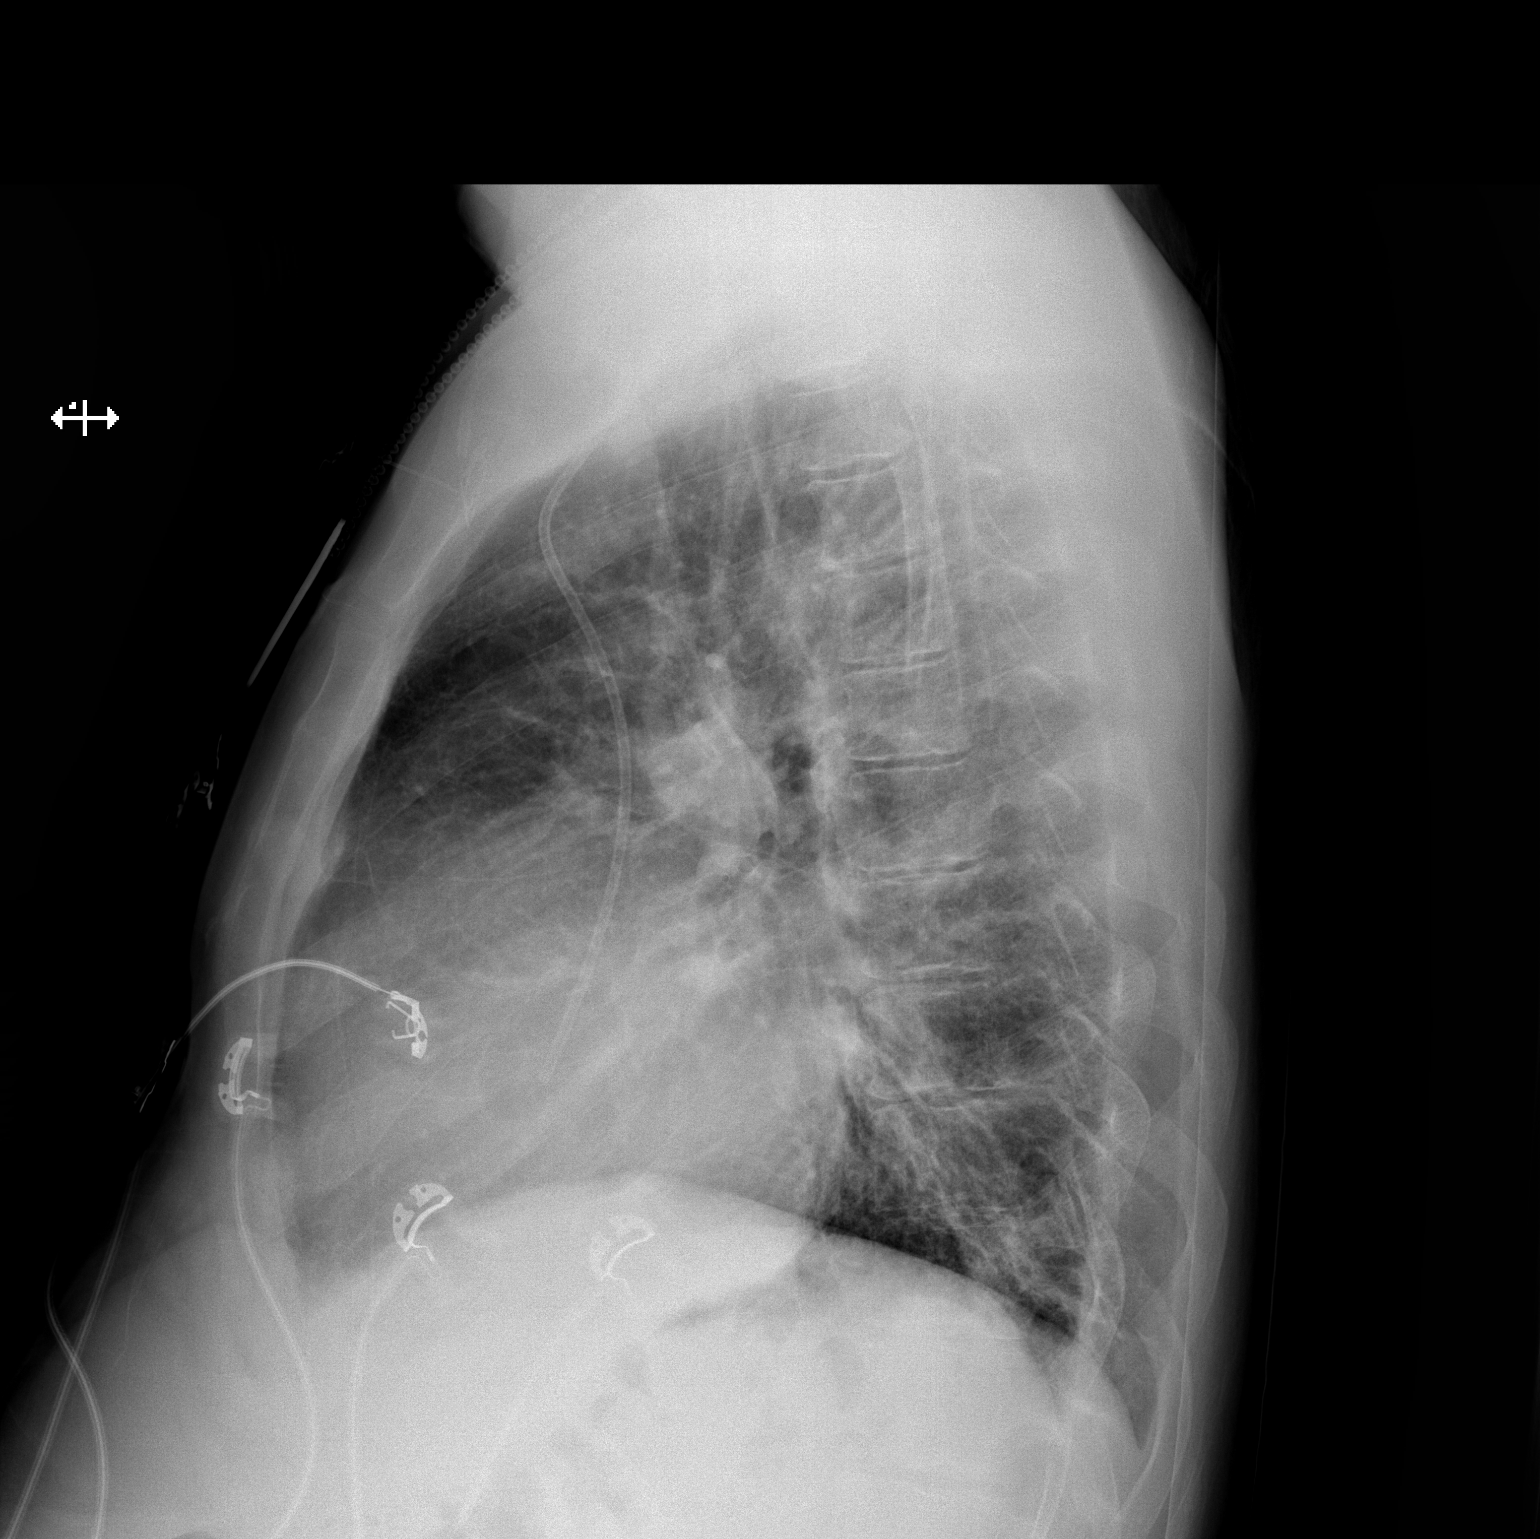

[x chest ap]
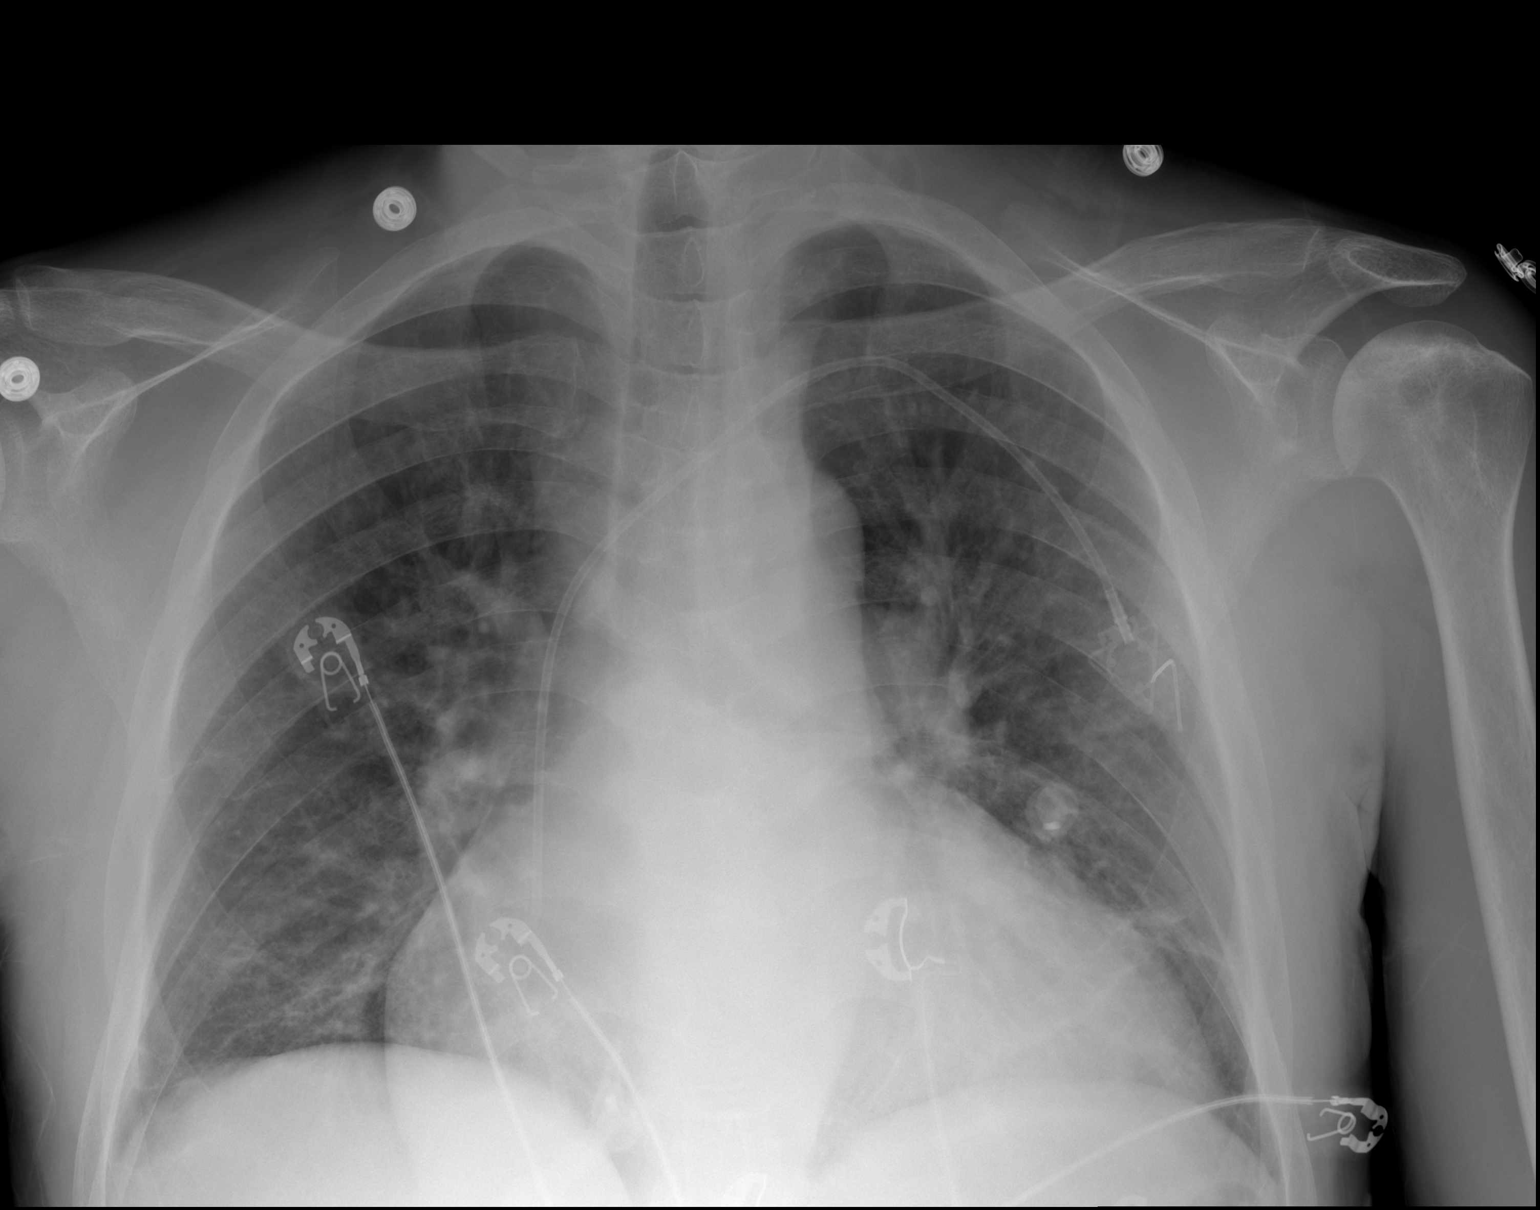

[2 of 2 positions shown; findings below may reference images not displayed]

FINDINGS: There is an enlarged cardiac silhouette, stable from 06/01/2015. No
pleural effusions. No airspace consolidation. Mild vascular
prominence, unchanged. Airway is unremarkable.

There is a left subclavian Port-A-Cath with tip at the cavoatrial
junction.
IMPRESSION: Enlarged cardiac silhouette, stable. No interval change from
06/01/2015.

## 2018-04-26 IMAGING — CT CT ABD-PELV W/ CM
2 of 4 series · 17 of 46 positions shown, 19 images · IV contrast (iopamidol)
Comparison: 06/01/2009

CLINICAL DATA: Fever flank pain and leukocytosis.

EXAM:
CT ABDOMEN AND PELVIS WITH CONTRAST
TECHNIQUE: Multidetector CT imaging of the abdomen and pelvis was performed
using the standard protocol following bolus administration of
intravenous contrast.
CONTRAST:  100mL [id number] IOPAMIDOL (J4FQ5F-FAA) INJECTION 61%

[Series 2: rtn a/p with · axial · 0.79mm/px · z∈[+788,+1238]mm · 14 of 100 slices shown, 16 images]
[im 5/100  soft-tissue]
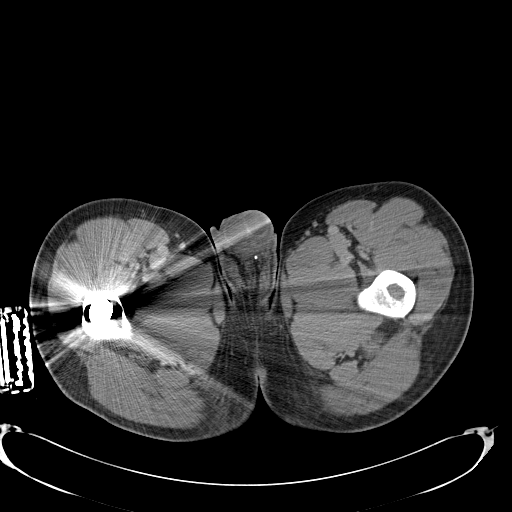
[im 5/100  bone]
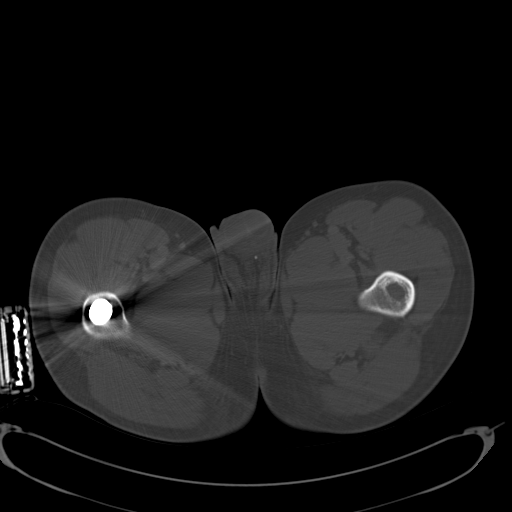
[im 15/100  soft-tissue]
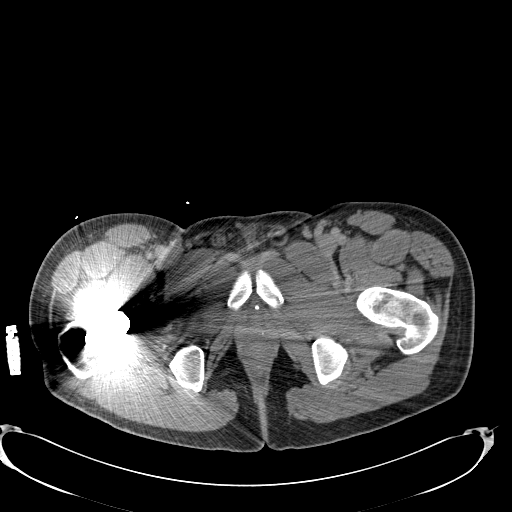
[im 20/100  soft-tissue]
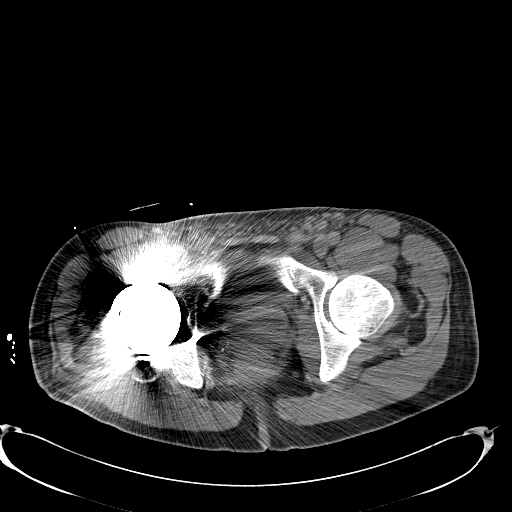
[im 25/100  soft-tissue]
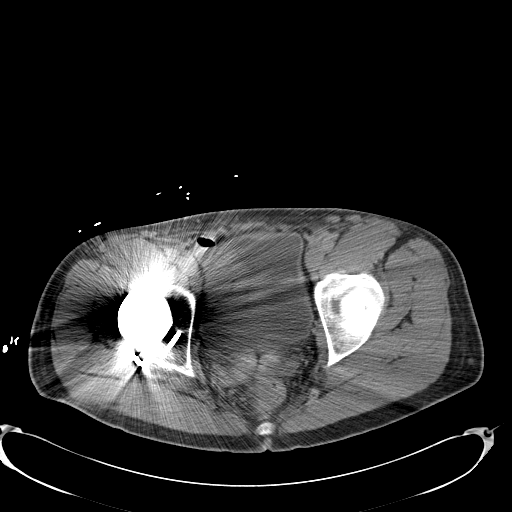
[im 35/100  soft-tissue]
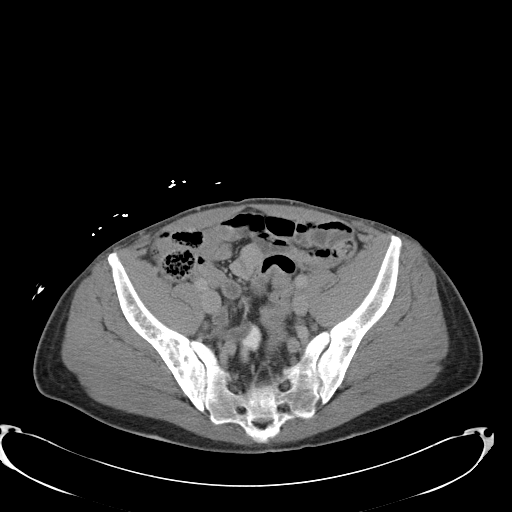
[im 40/100  soft-tissue]
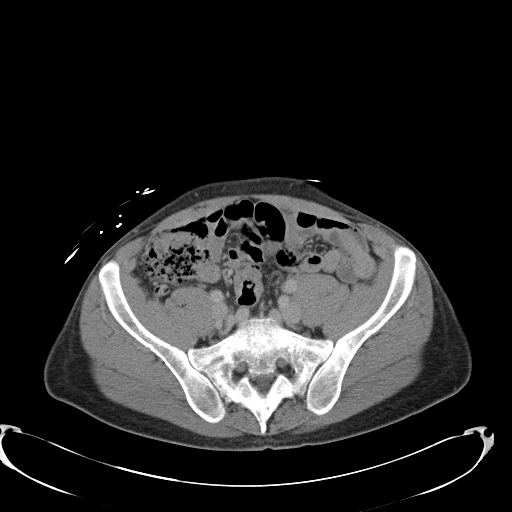
[im 45/100  soft-tissue]
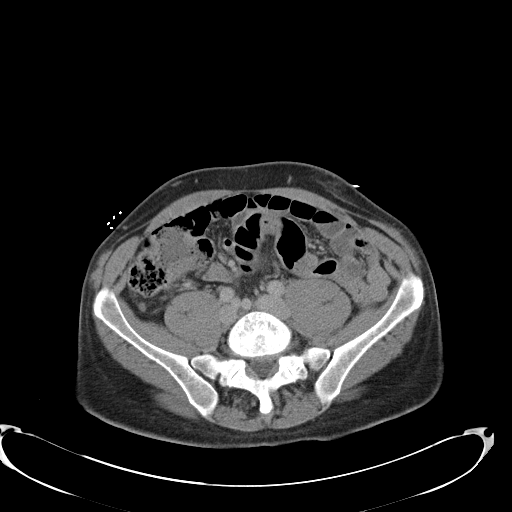
[im 55/100  soft-tissue]
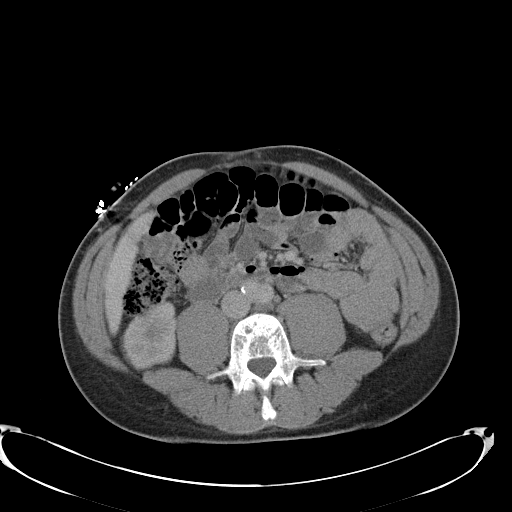
[im 60/100  soft-tissue]
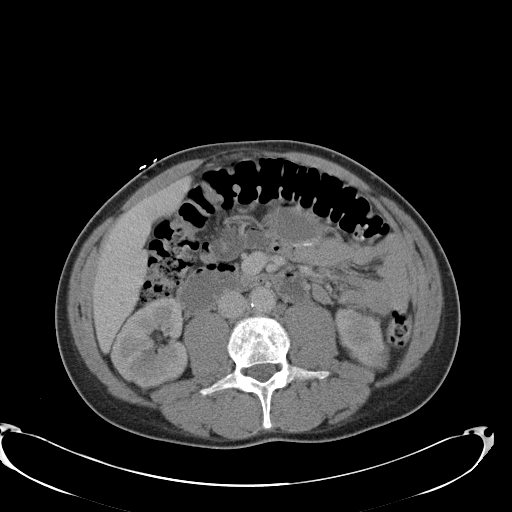
[im 60/100  bone]
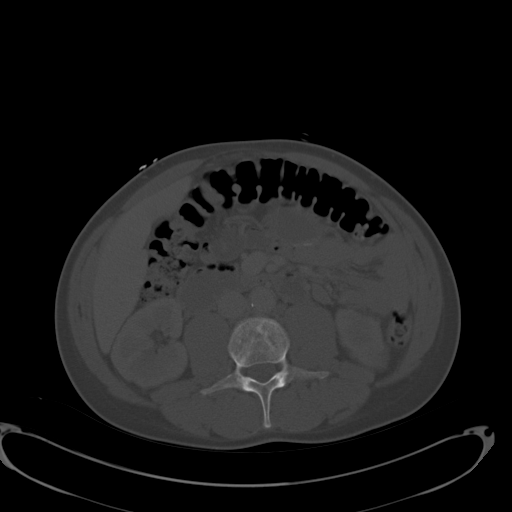
[im 65/100  soft-tissue]
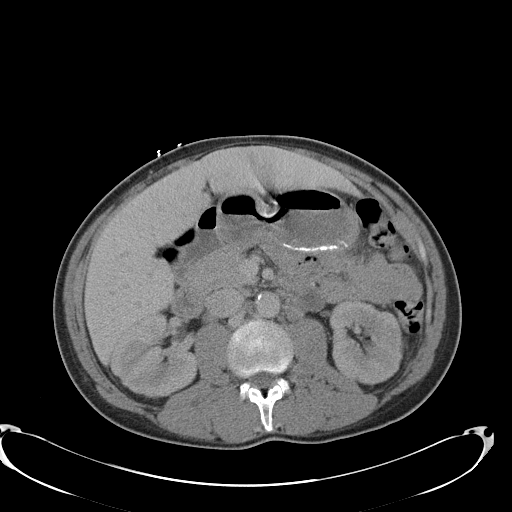
[im 75/100  soft-tissue]
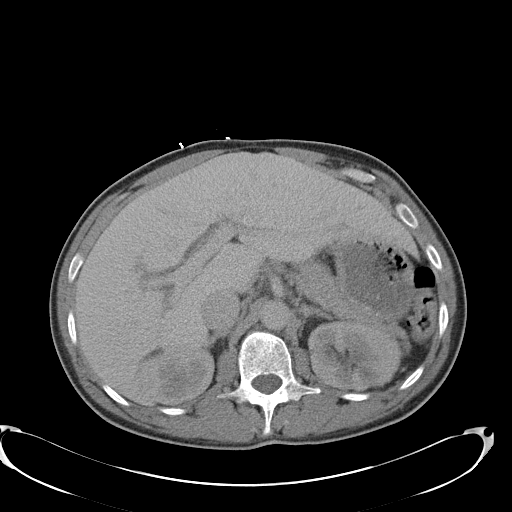
[im 80/100  soft-tissue]
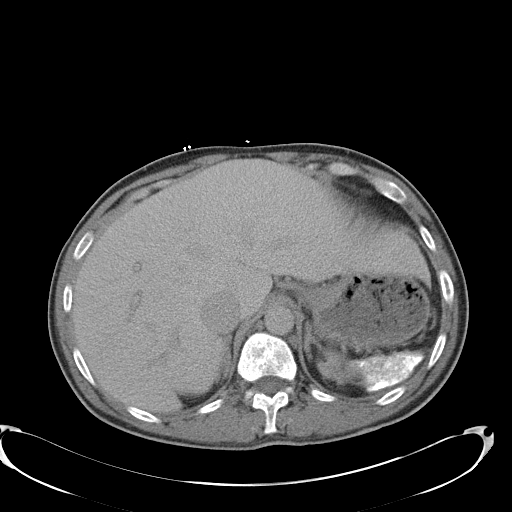
[im 85/100  soft-tissue]
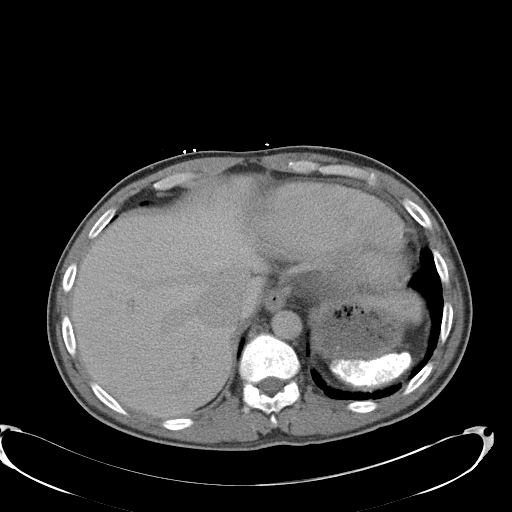
[im 95/100  soft-tissue]
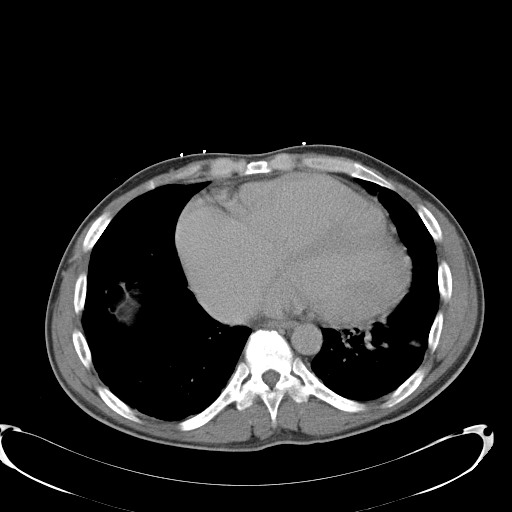

[Series 602: <mpr thick range> · coronal · 0.97mm/px · 3 of 123 slices shown]
[im 41/123  soft-tissue]
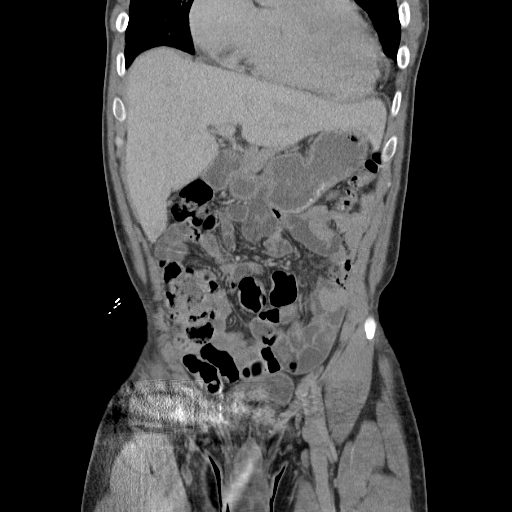
[im 55/123  soft-tissue]
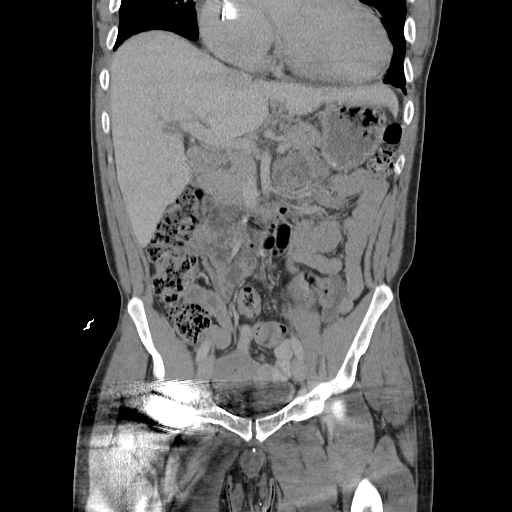
[im 68/123  soft-tissue]
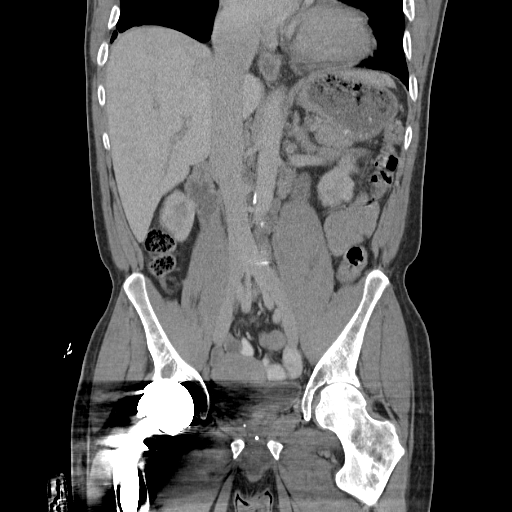

[17 of 46 positions shown; findings below may reference images not displayed]

FINDINGS: Lower chest: Ventriculomegaly, unchanged. Chronic scarring in both
lungs.

Hepatobiliary: Cholecystectomy. No focal liver lesions. No bile duct
dilatation.

Pancreas: Normal

Spleen: Small and calcified, unchanged.

Adrenals/Urinary Tract: The adrenals and kidneys are normal in
appearance. There is no urinary calculus evident. There is no
hydronephrosis or ureteral dilatation. Collecting systems and
ureters appear unremarkable.

Stomach/Bowel: There are normal appearances of the stomach, small
bowel and colon. The appendix is normal.

Vascular/Lymphatic: The abdominal aorta is normal in caliber. There
is mild atherosclerotic calcification. There is no pathologically
enlarged adenopathy in the abdomen or pelvis. There is a prominent
number of small periaortic nodes, unchanged.

Reproductive: Unremarkable

Other: No acute inflammatory changes are evident in the abdomen or
pelvis. There is no ascites.

Musculoskeletal: Multiple bone infarcts. Probable left femoral head
AVN. Incidental umbilical hernia, nonobstructive.
IMPRESSION: No acute findings are evident in the abdomen or pelvis. Chronic
splenic infarction. Multiple bone infarctions. Probable left hip
AVN. Cardiomegaly. The findings are all typical of sickle cell
disease.

## 2018-05-02 IMAGING — DX DG CHEST 2V
2 series · 2 of 2 positions shown · non-contrast
Comparison: PA and lateral chest x-ray June 02, 2015

CLINICAL DATA: Cough and body aches for the past 2 days, no
shortness of breath, history of pulmonary embolism, sickle cell
anemia

EXAM:
CHEST  2 VIEW

[chest lat]
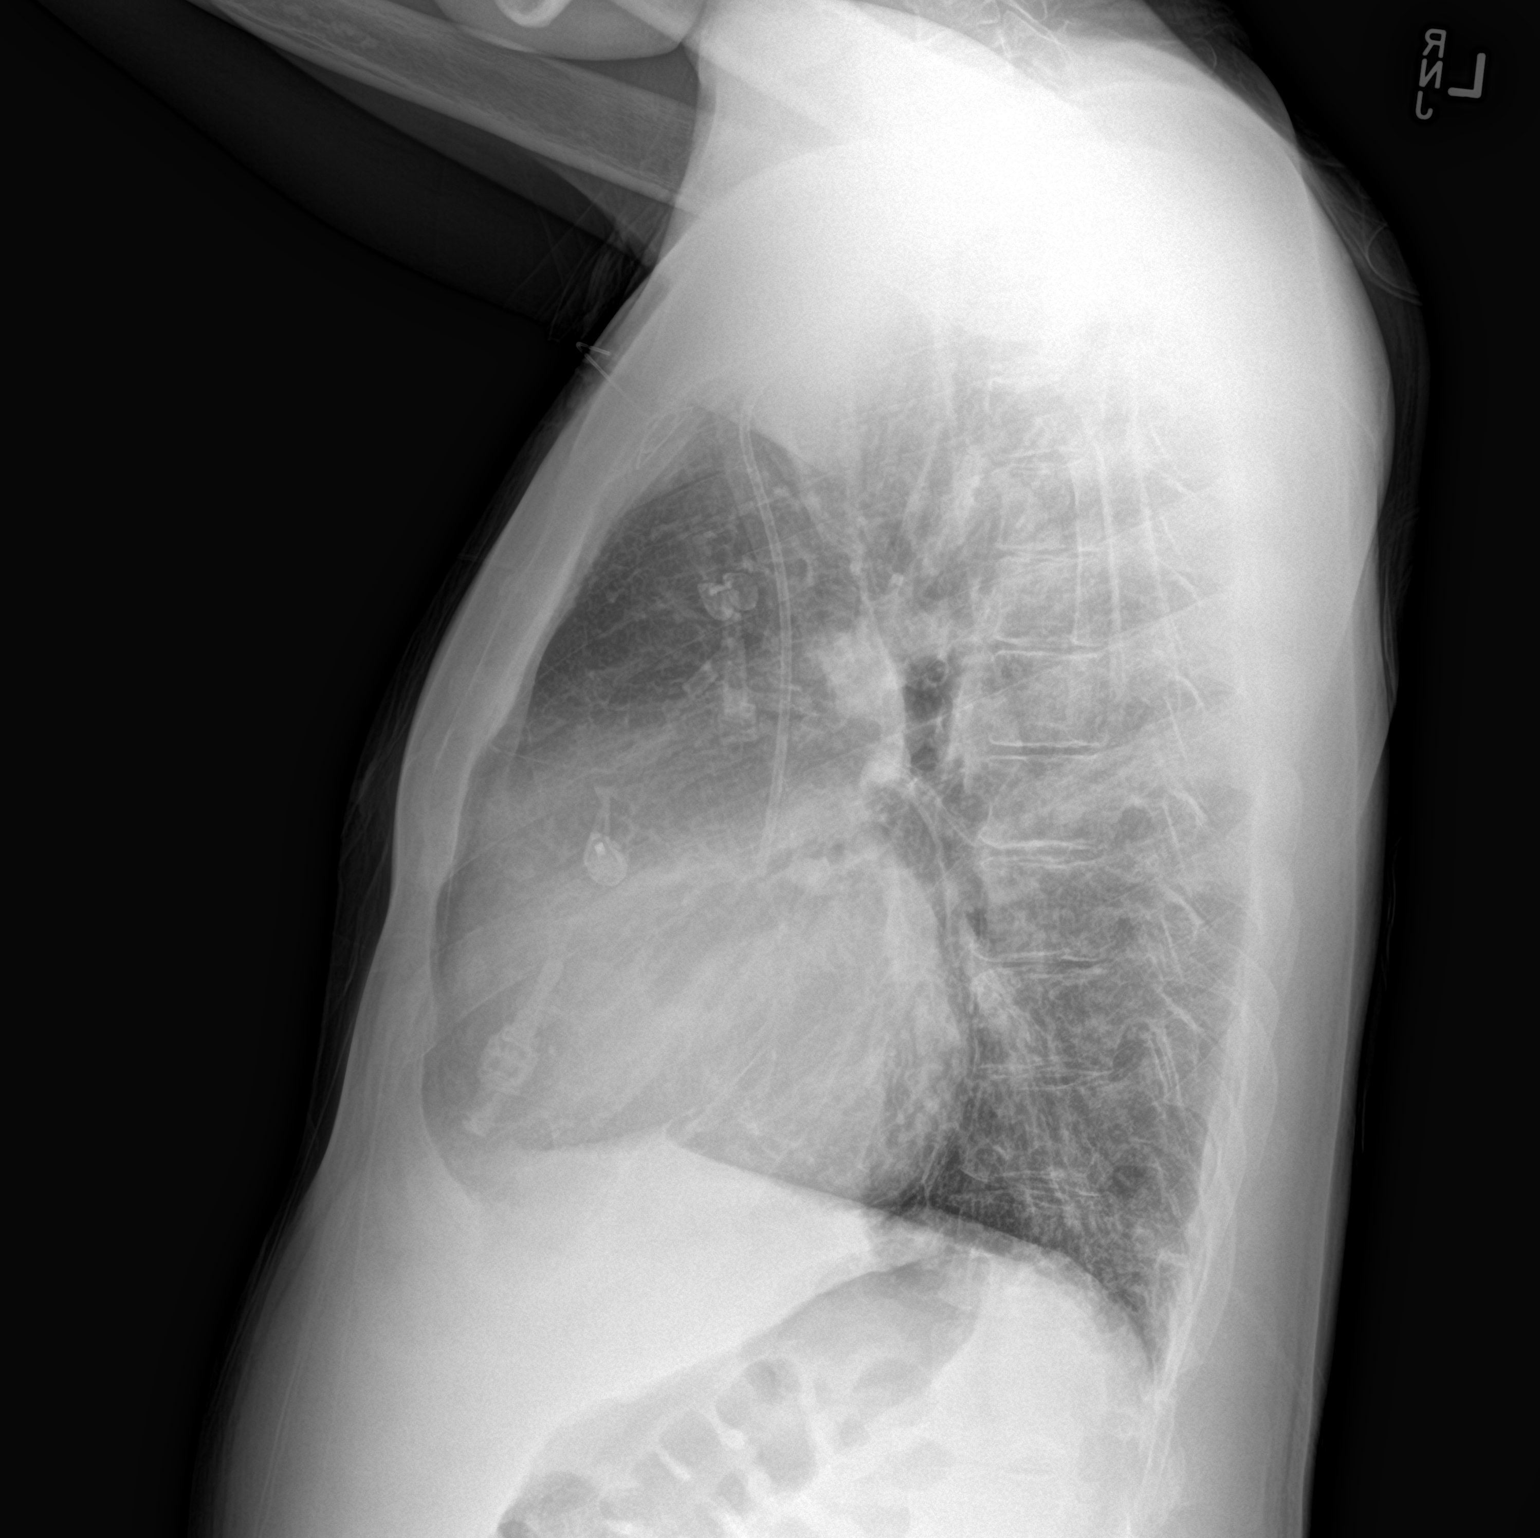

[chest pa]
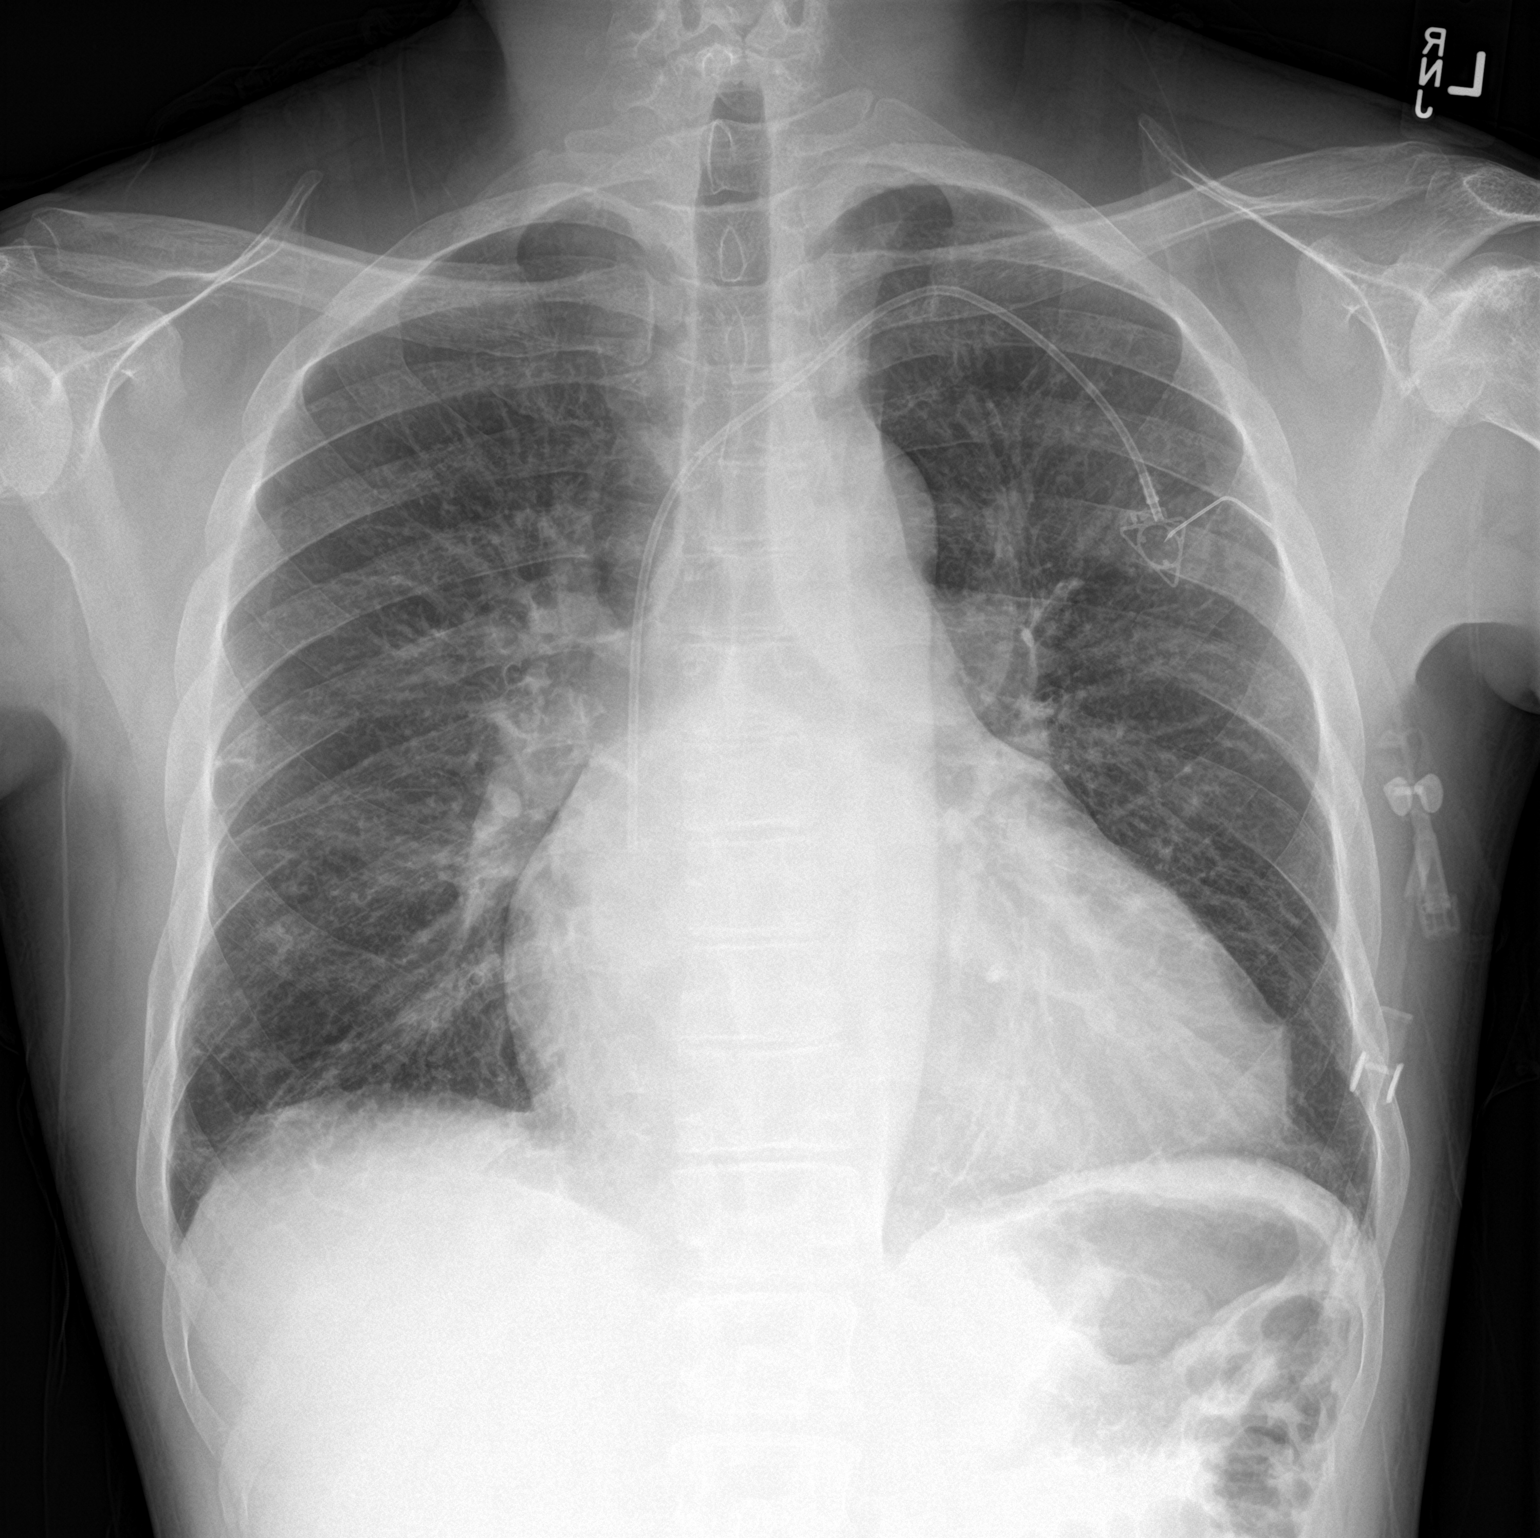

[2 of 2 positions shown; findings below may reference images not displayed]

FINDINGS: The lungs are adequately inflated. The cardiopericardial silhouette
remains enlarged. The pulmonary vascularity remains engorged but is
slightly less the bony thorax exhibits no acute abnormality. There
IMPRESSION: COPD, low-grade CHF. No alveolar pneumonia. If the patient has
elevated white blood cell count or fever, follow-up PA and lateral
chest X-ray is recommended in 3-4 weeks following trial of
antibiotic therapy.

## 2018-05-29 IMAGING — DX DG CHEST 2V
2 series · 2 of 2 positions shown · non-contrast
Comparison: Chest x-ray dated 06/19/2015.

CLINICAL DATA: Sickle cell crisis with chest pain and shortness of
breath today.

EXAM:
CHEST  2 VIEW

[chest pa]
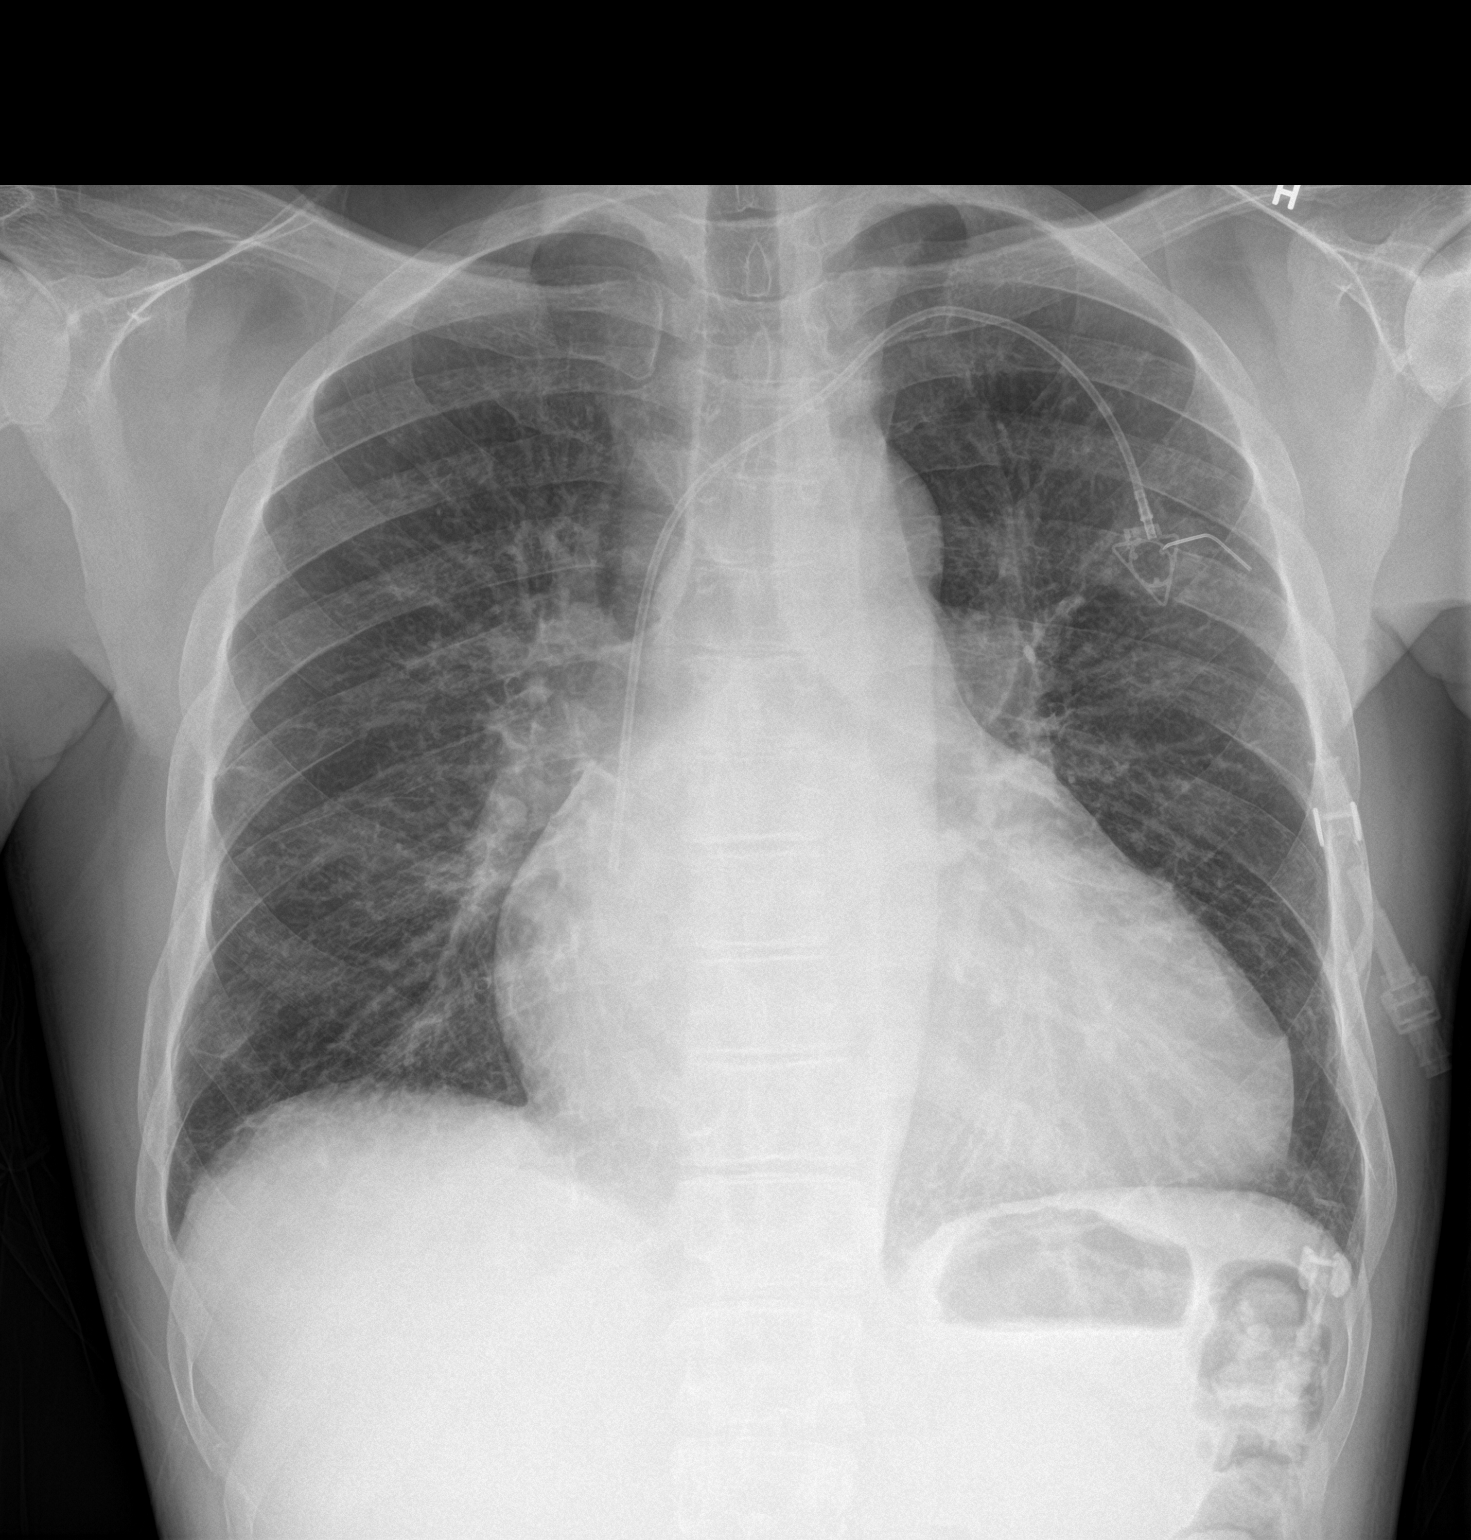

[chest lat]
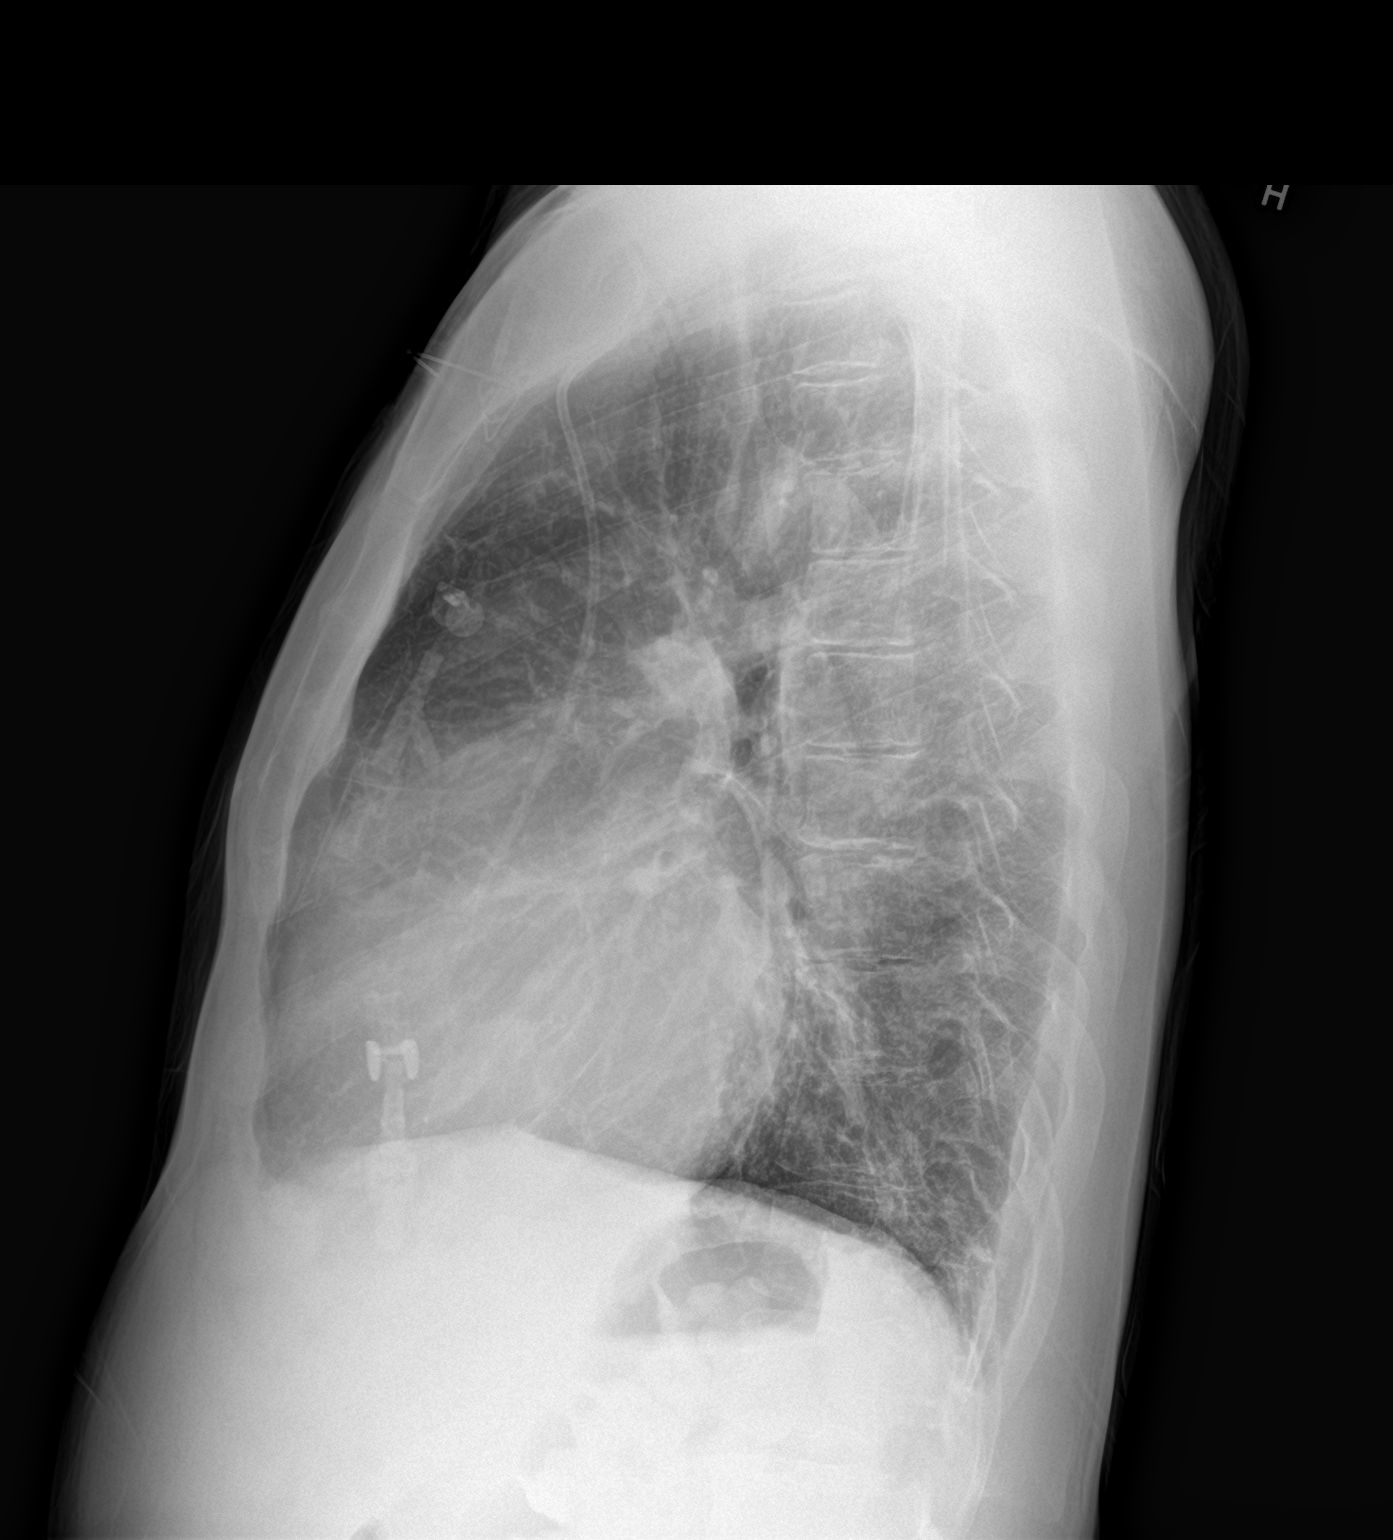

[2 of 2 positions shown; findings below may reference images not displayed]

FINDINGS: Cardiomegaly is stable. Left chest wall Port-A-Cath is stable in
position with tip overlying the expected location of the cavoatrial
junction. Pulmonary vasculature remains mildly prominent, without
frank pulmonary edema. Suspect some degree of chronic pulmonary
artery hypertension.

No evidence of pneumonia. No pleural effusion or pneumothorax seen.
Osseous structures about the chest are unremarkable.
IMPRESSION: Stable cardiomegaly.  No acute findings.

## 2018-05-29 NOTE — Telephone Encounter (Signed)
Message sent to provider 

## 2018-05-30 IMAGING — CR DG CHEST 2V
2 series · 2 of 2 positions shown · non-contrast
Comparison: 07/06/2015

CLINICAL DATA: Shortness of breath, history of sickle cell

EXAM:
CHEST  2 VIEW

[w chest lat]
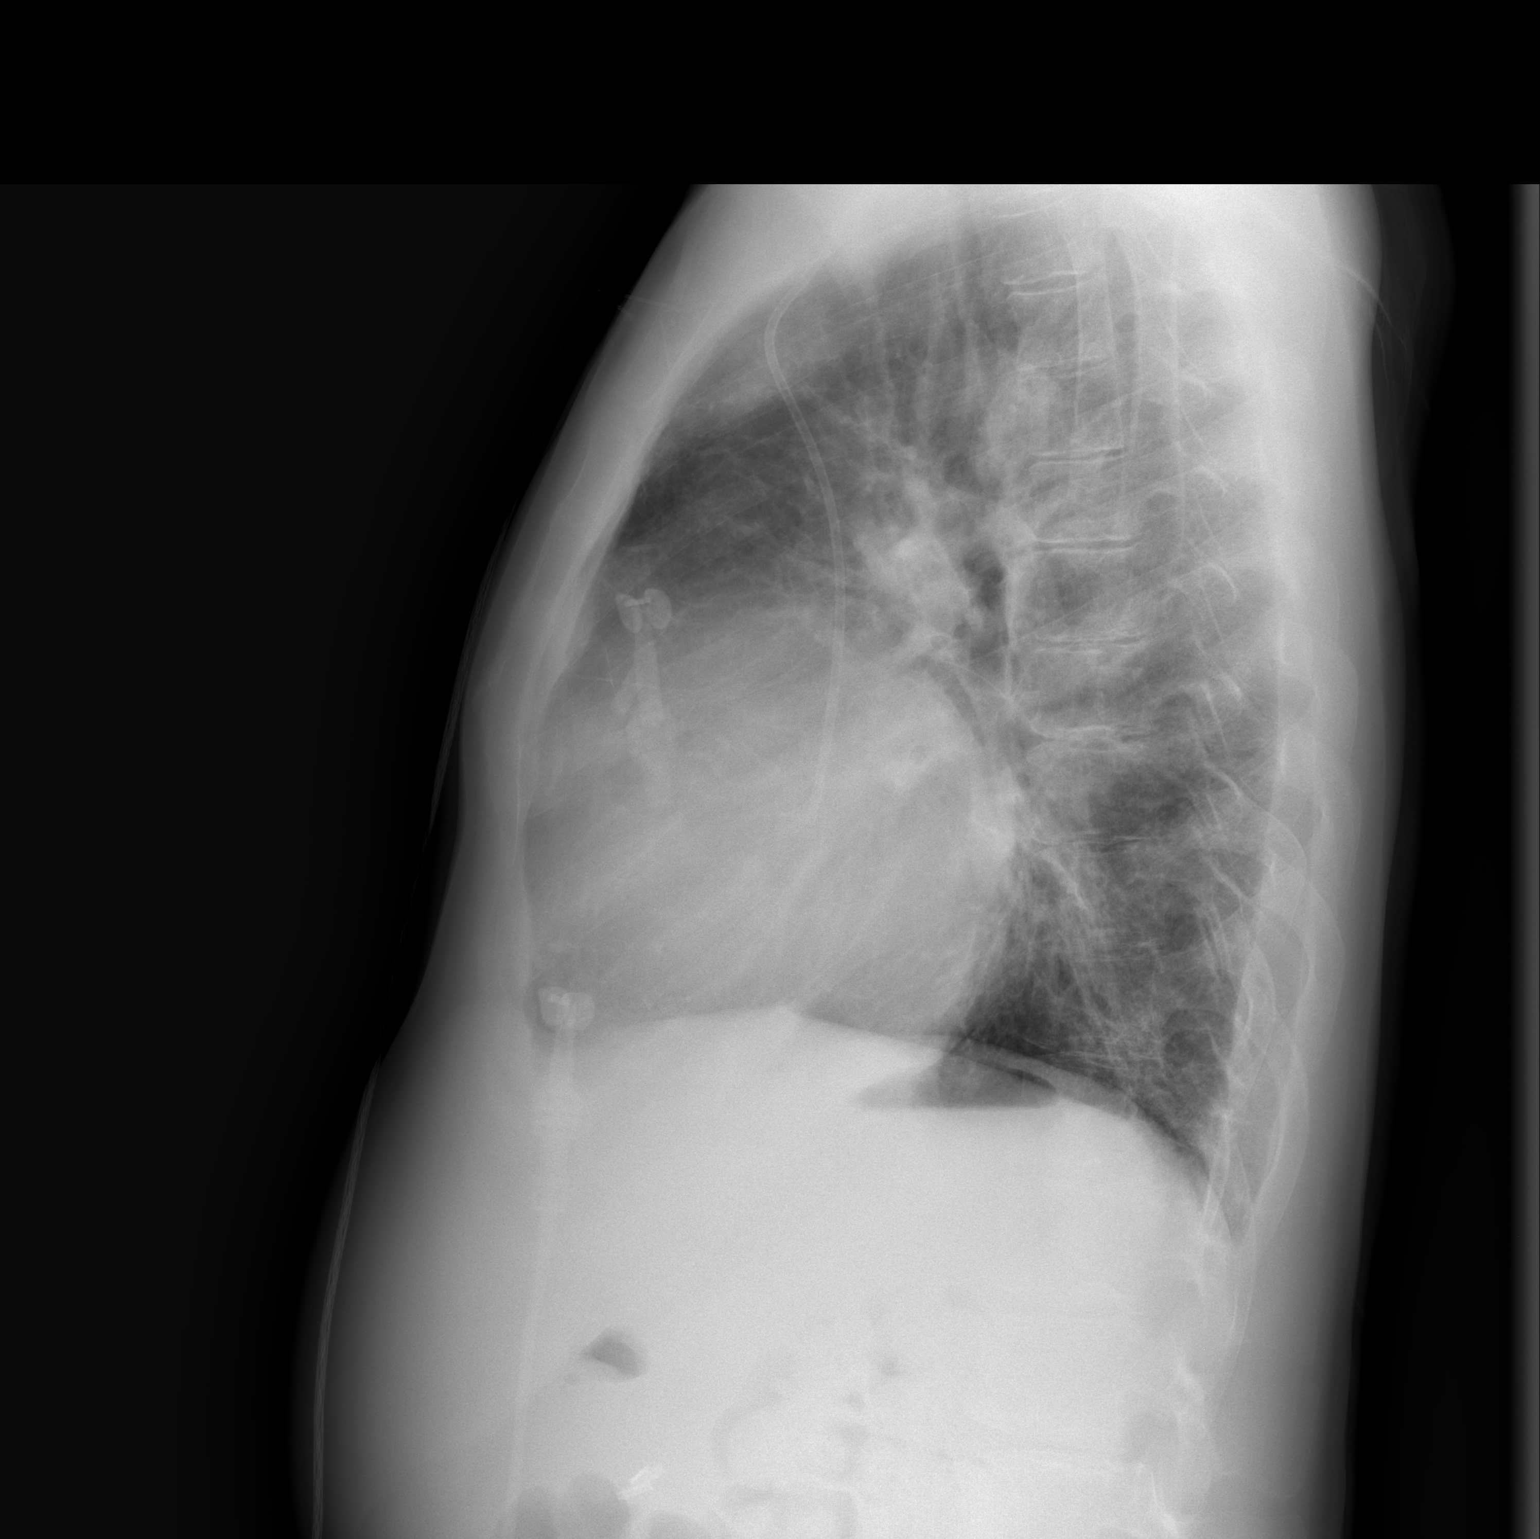

[w chest pa]
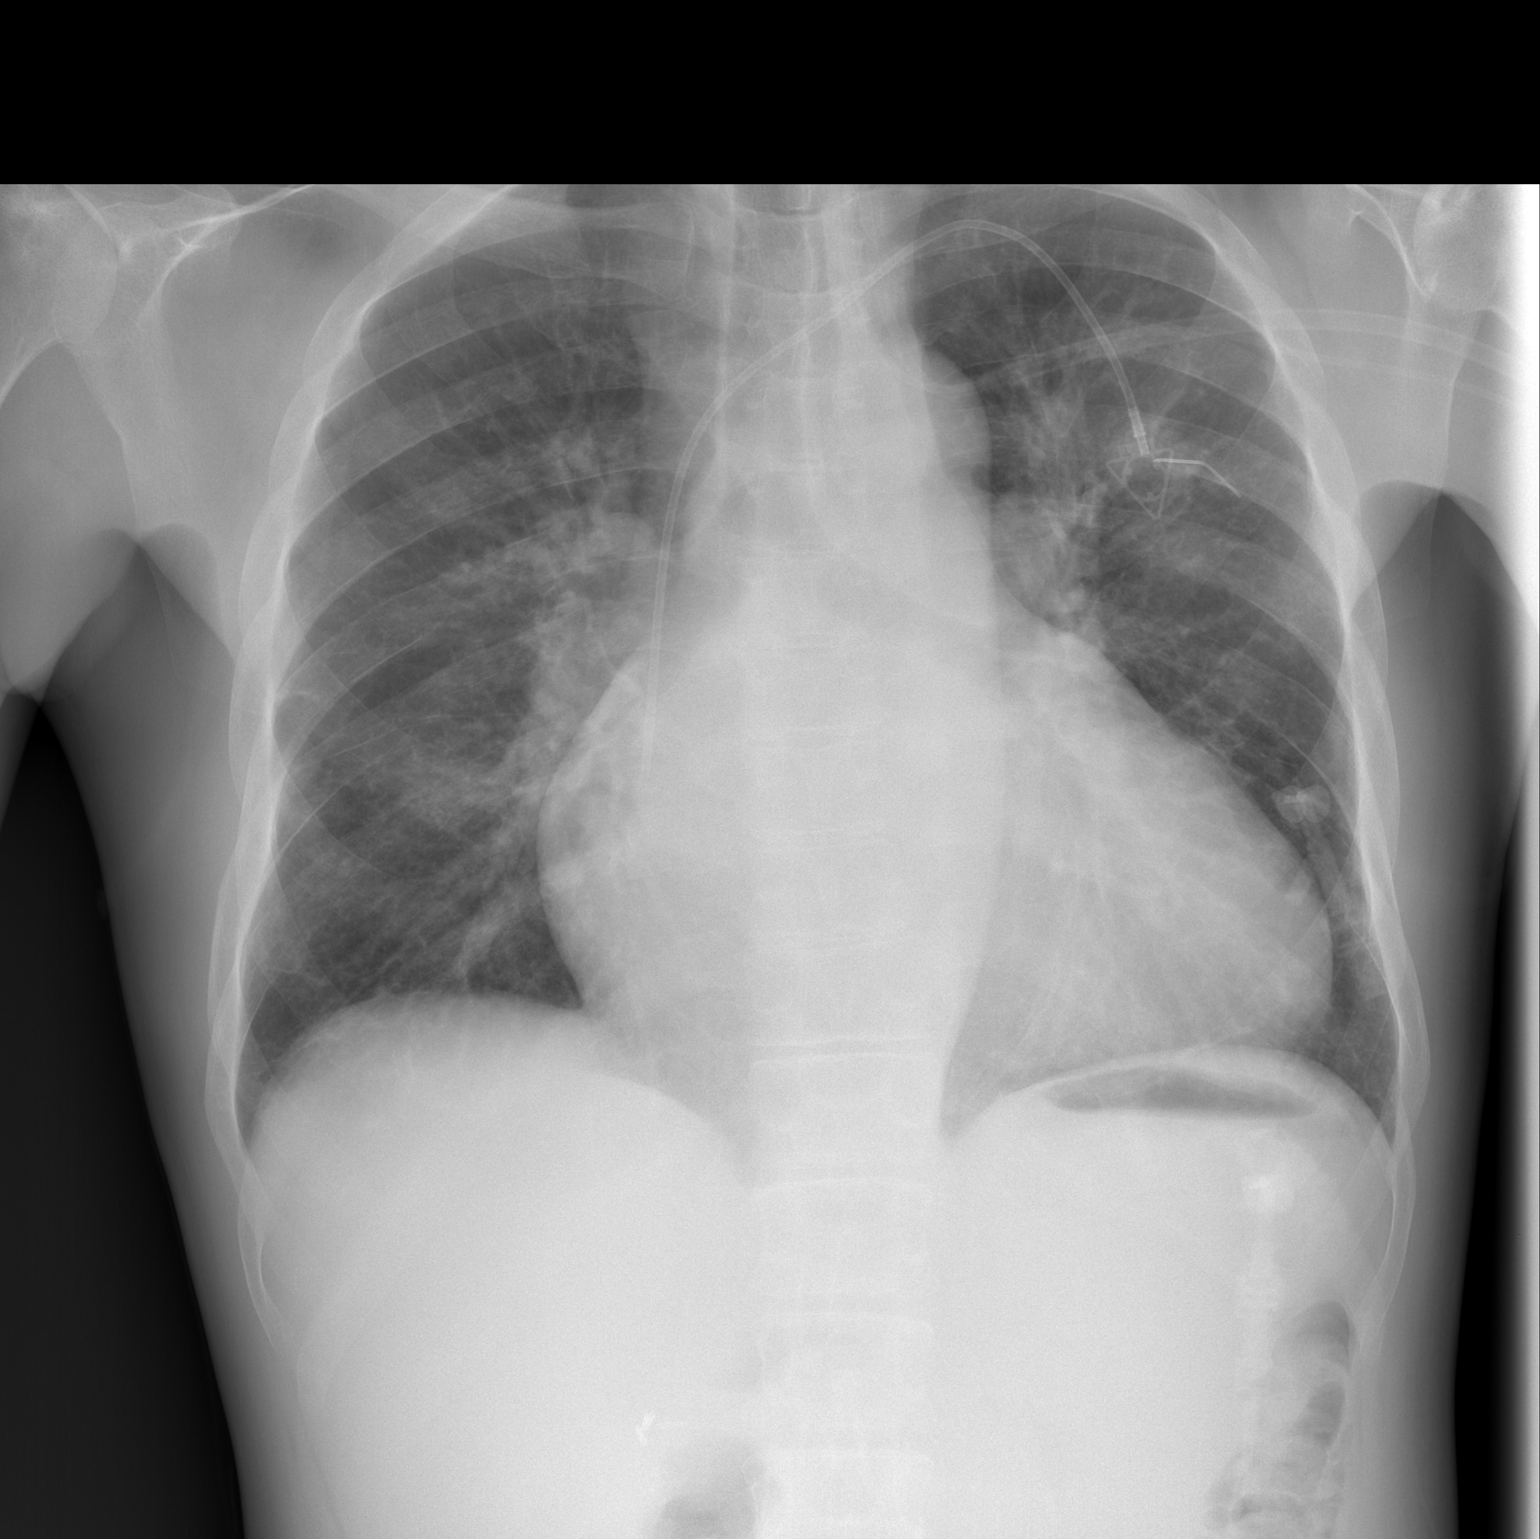

[2 of 2 positions shown; findings below may reference images not displayed]

FINDINGS: Moderate cardiac silhouette enlargement stable. Minimal vascular
congestion. No pleural effusion. No consolidation.
IMPRESSION: Cardiac enlargement stable with development of very mild vascular
congestion.

## 2018-05-30 NOTE — Telephone Encounter (Signed)
Message sent to provider 

## 2018-05-31 NOTE — Telephone Encounter (Signed)
Message sent to provider 

## 2018-06-01 IMAGING — DX DG CHEST 1V PORT
1 series · 1 of 1 positions shown · non-contrast
Comparison: July 08, 2015

CLINICAL DATA: Shortness of breath.  Sickle cell disease

EXAM:
PORTABLE CHEST 1 VIEW

[chest ap]
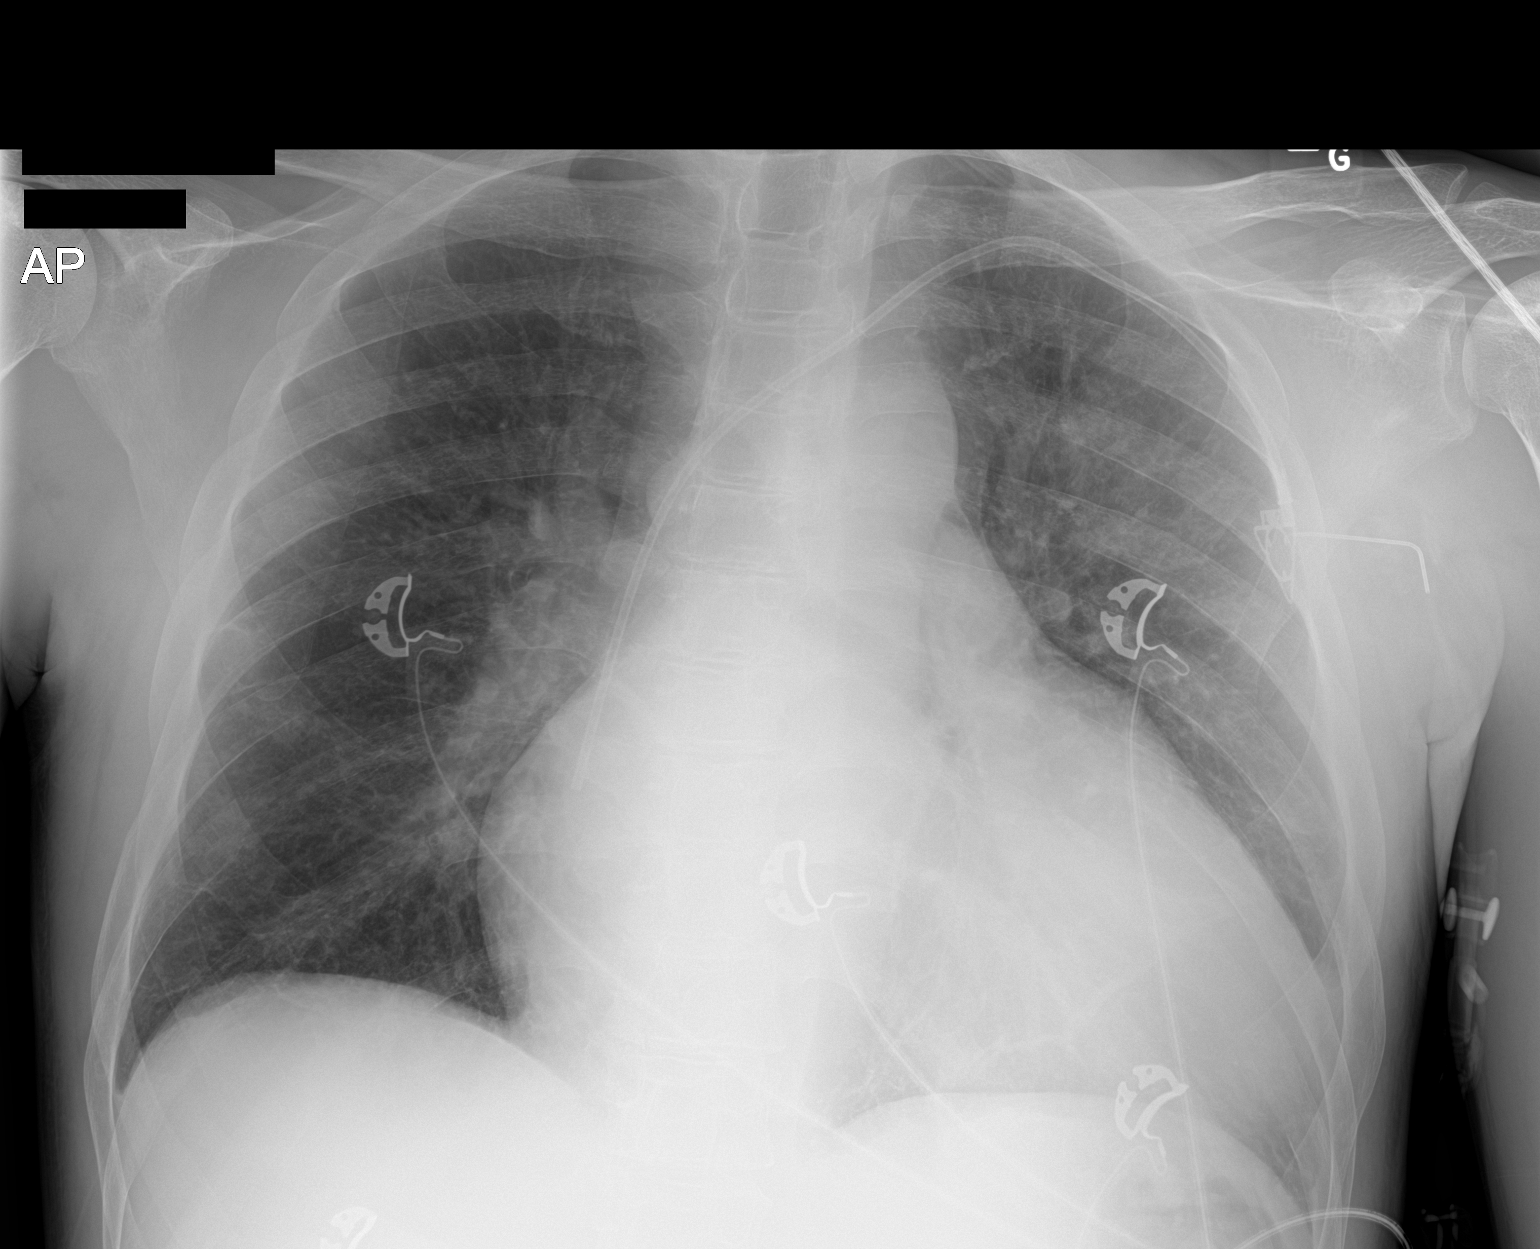

[1 of 1 positions shown; findings below may reference images not displayed]

FINDINGS: Port-A-Cath tip is in the superior cava. No pneumothorax. There is
no edema or consolidation. There is generalized cardiac enlargement.
There is pulmonary venous hypertension. No adenopathy evident. No
bone lesions.
IMPRESSION: Cardiomegaly with pulmonary vascular congestion.  No consolidation.

## 2018-06-01 NOTE — Telephone Encounter (Signed)
Message sent to provider 

## 2018-06-09 IMAGING — CR DG CHEST 2V
2 series · 2 of 2 positions shown · non-contrast
Comparison: 07/09/2015

CLINICAL DATA: Sickle cell crisis with pain and shortness of Breath

EXAM:
CHEST  2 VIEW

[w chest pa]
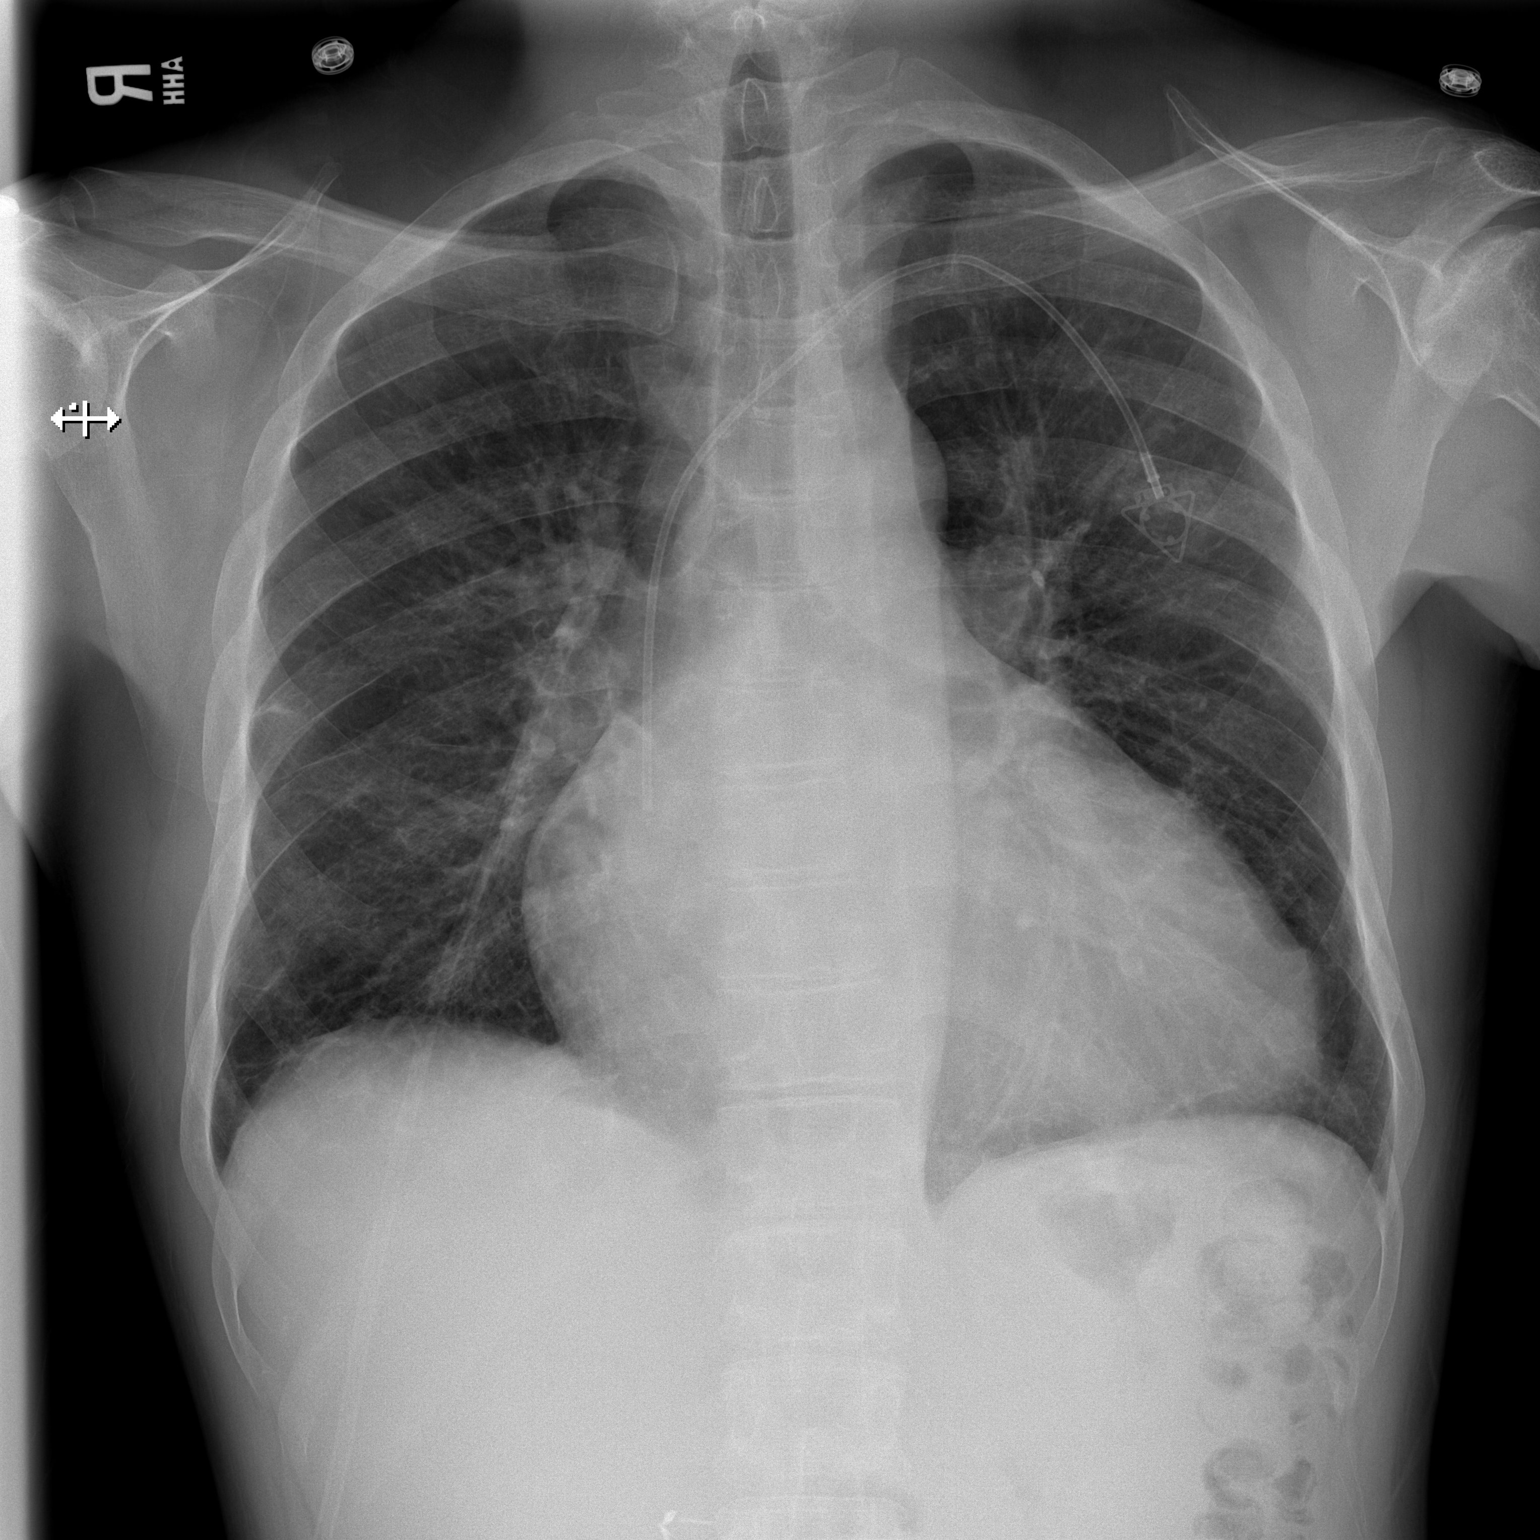

[w chest lat]
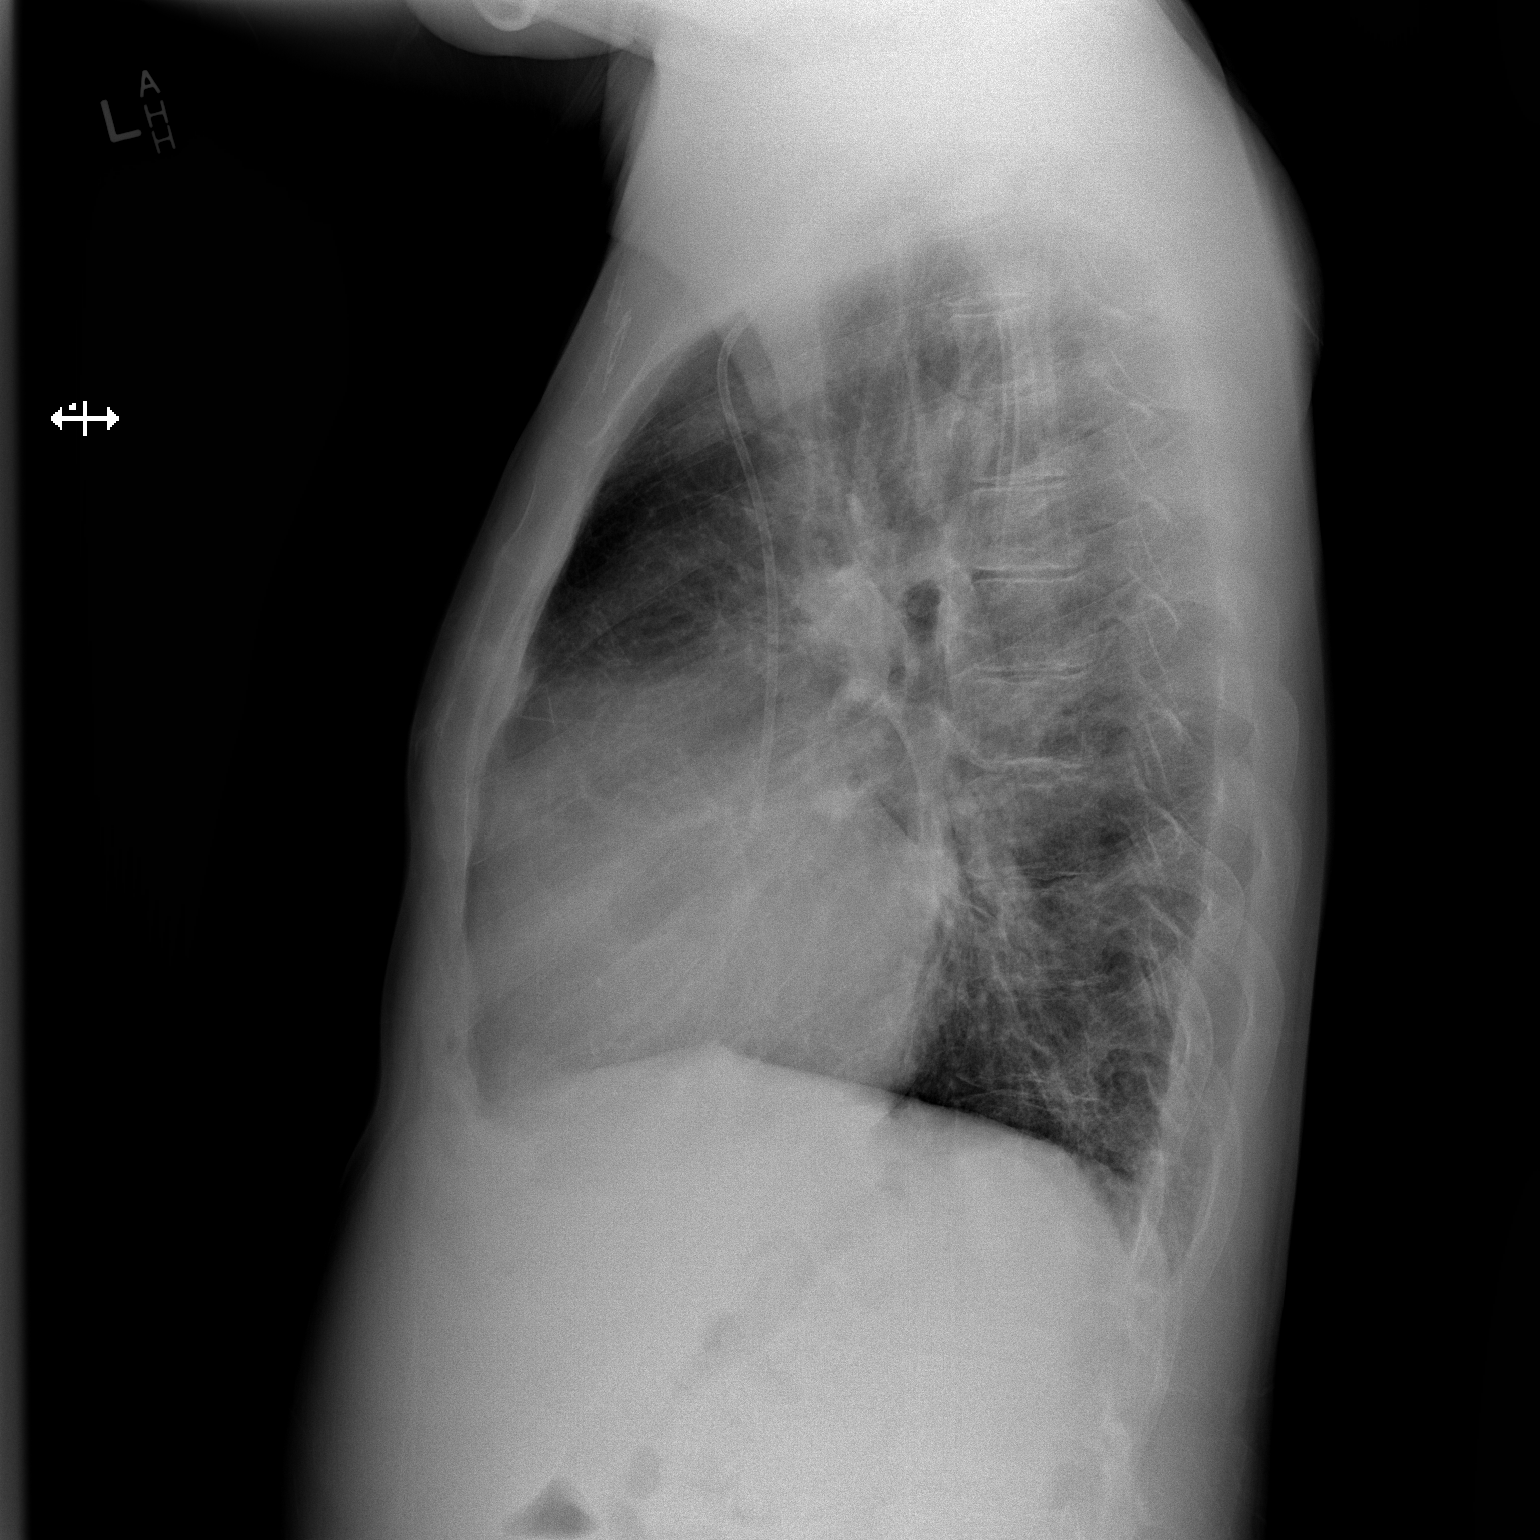

[2 of 2 positions shown; findings below may reference images not displayed]

FINDINGS: Cardiac shadow is again enlarged. Left chest wall port is again seen
and stable. No focal infiltrate or sizable effusion is noted. No
acute bony abnormality is seen.
IMPRESSION: No acute abnormality noted.

## 2018-06-25 IMAGING — CR DG CHEST 2V
2 series · 2 of 2 positions shown · non-contrast
Comparison: Chest radiograph performed 07/18/2015

CLINICAL DATA: Acute onset of shortness of breath and lower chest
pain. Initial encounter.

EXAM:
CHEST  2 VIEW

[w chest pa]
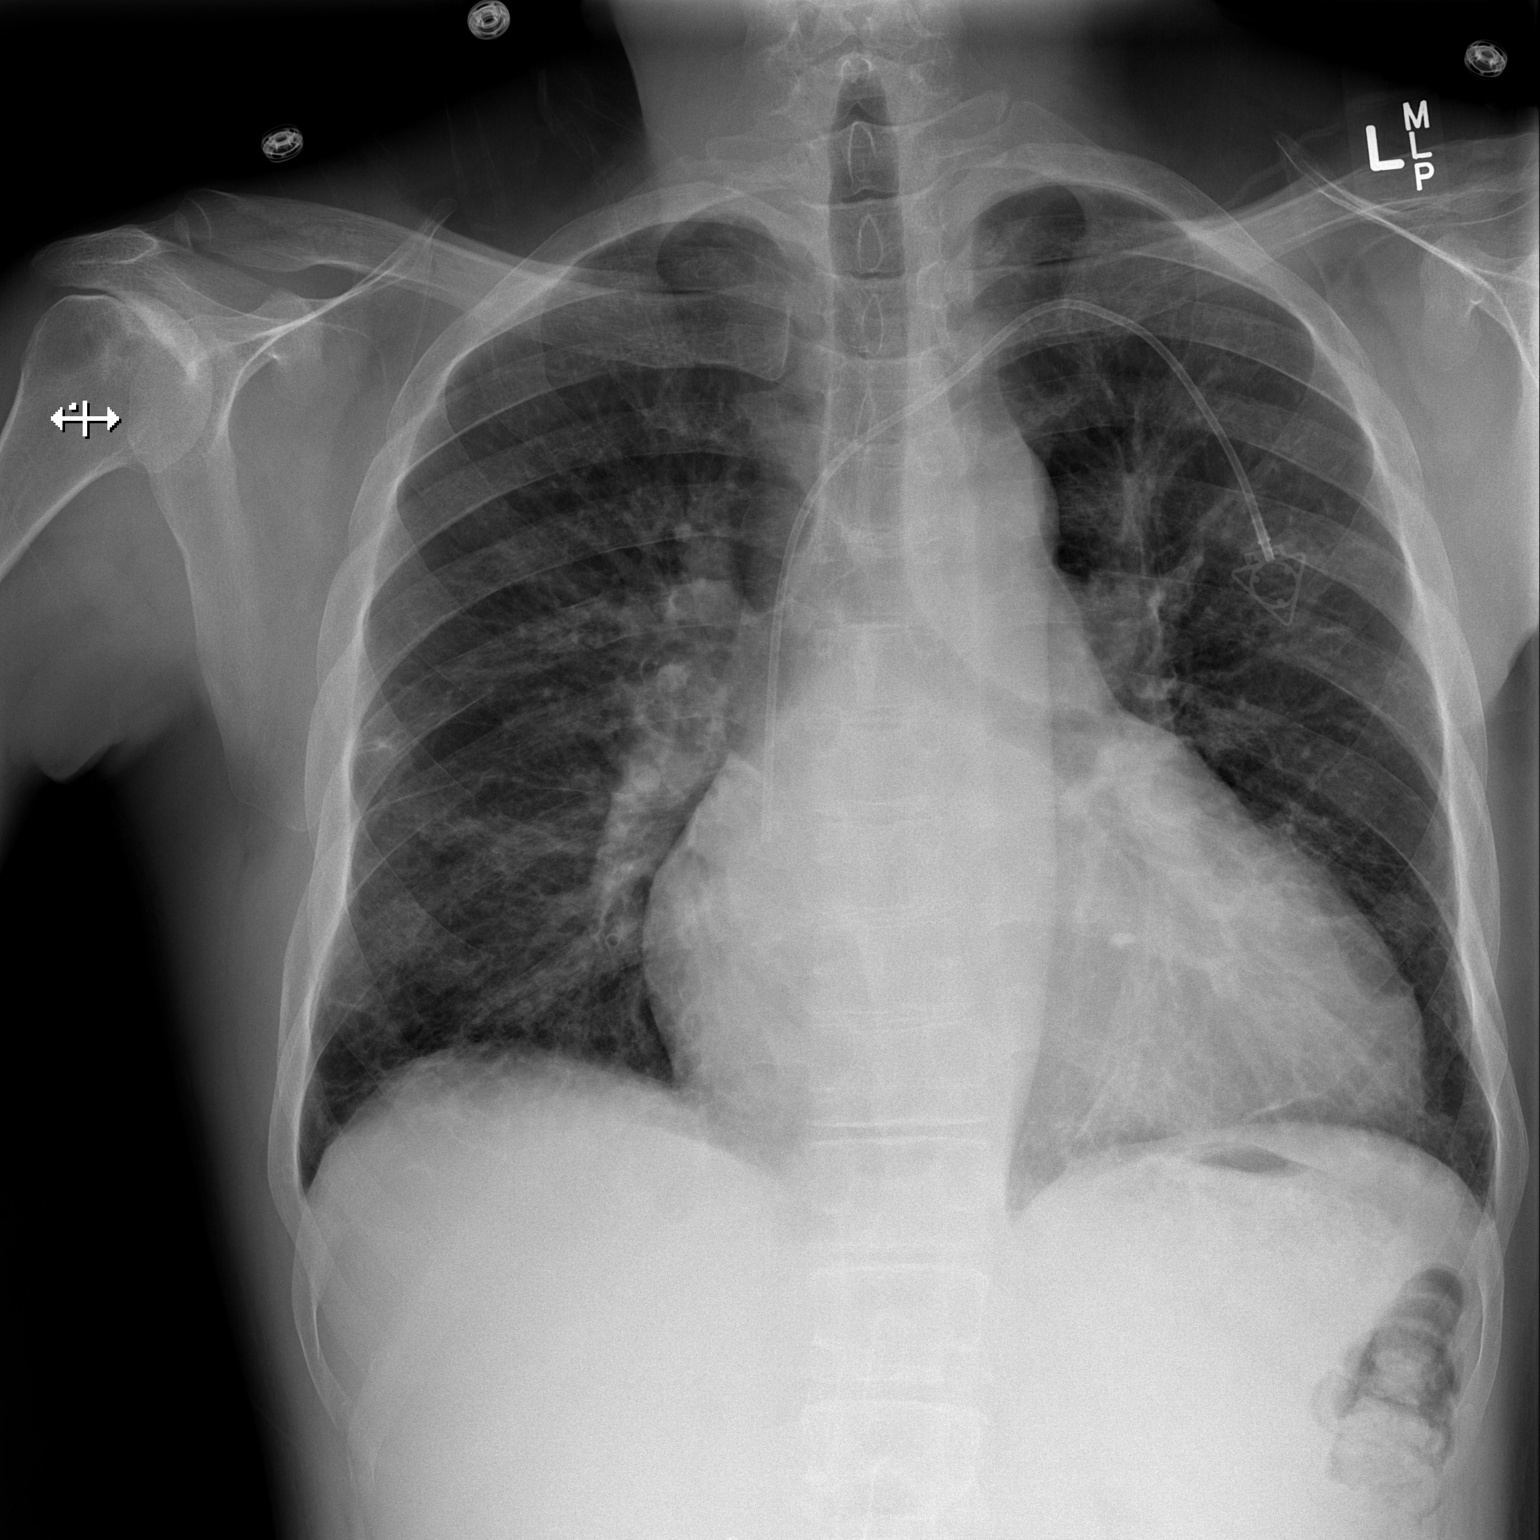

[w chest lat]
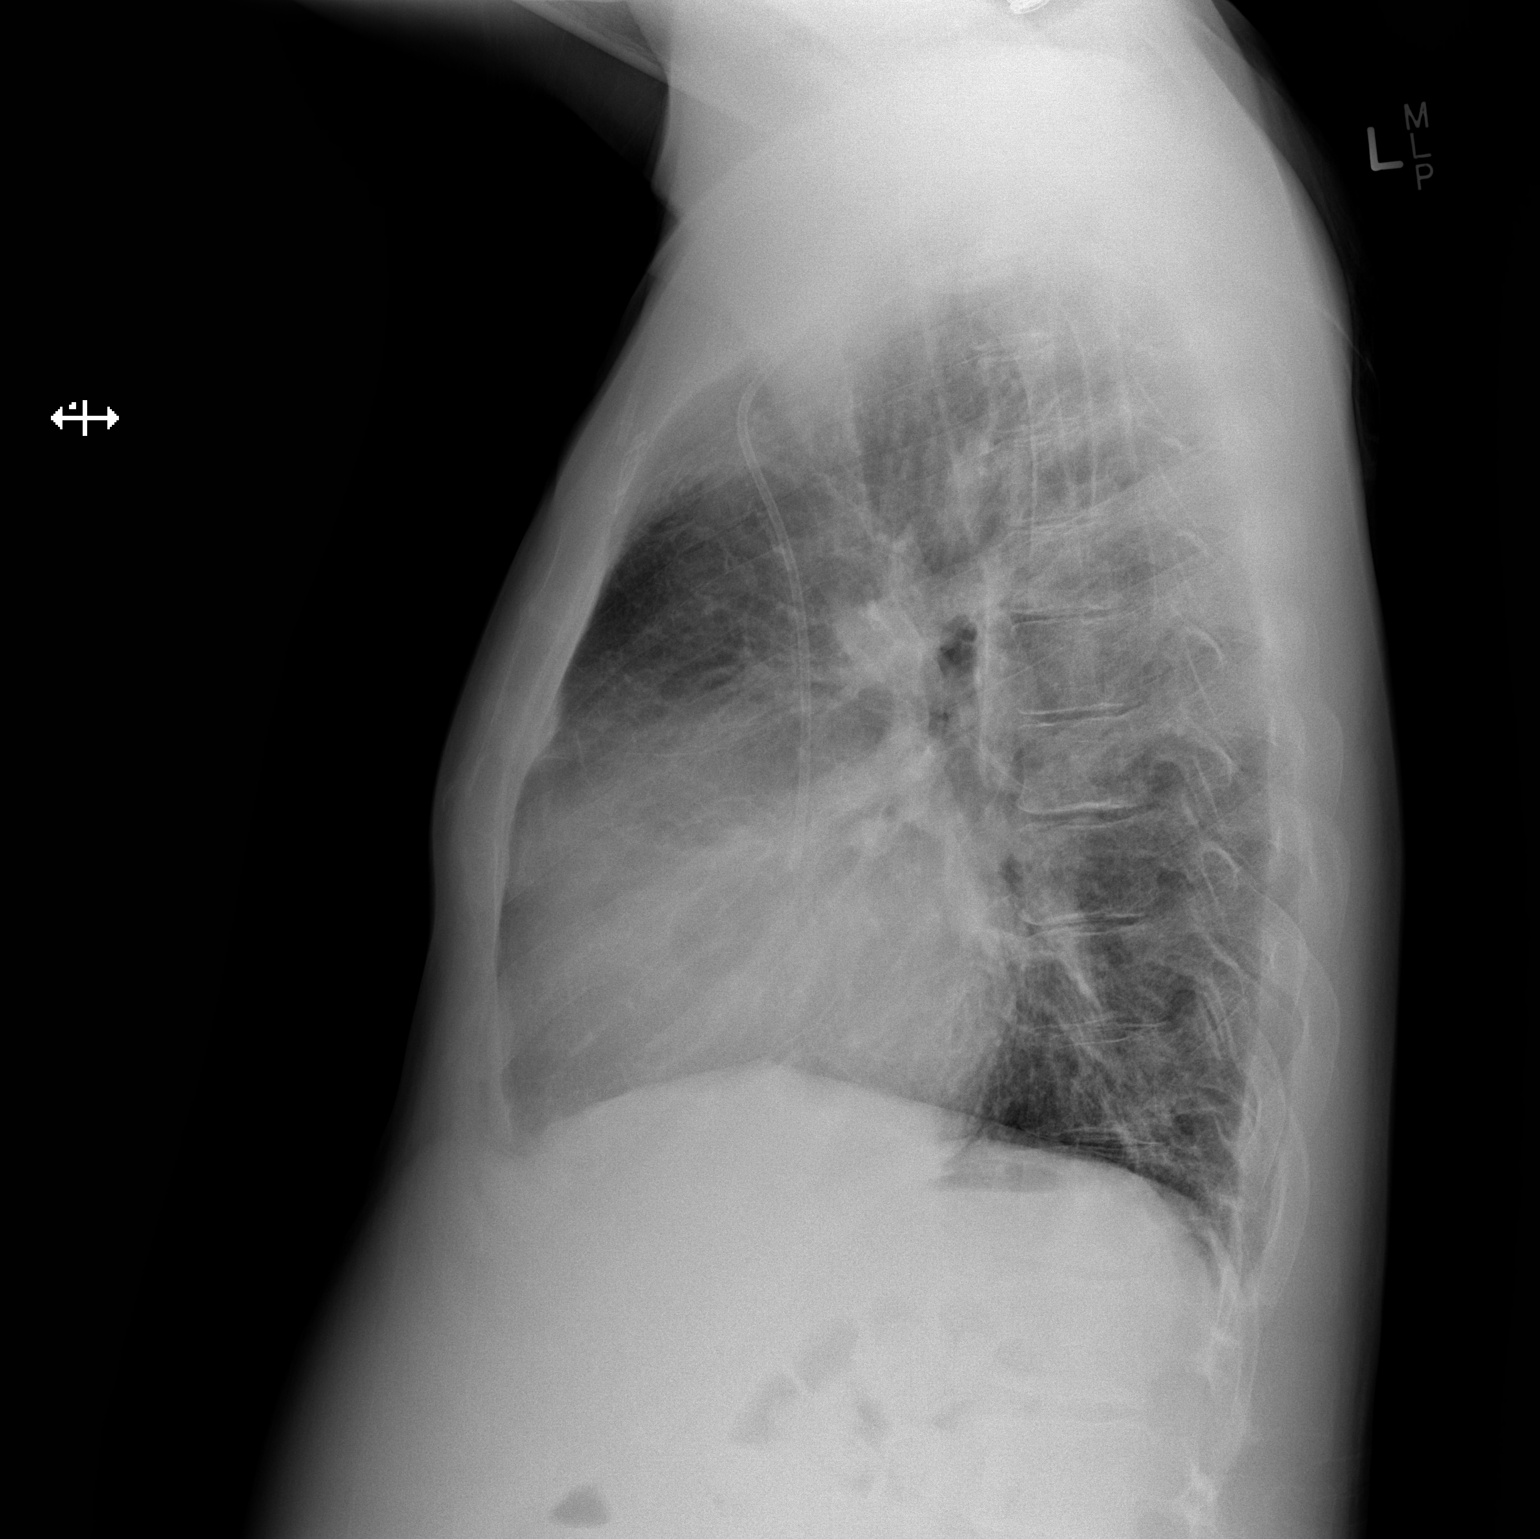

[2 of 2 positions shown; findings below may reference images not displayed]

FINDINGS: The lungs are well-aerated and clear. There is no evidence of focal
opacification, pleural effusion or pneumothorax.

The heart is enlarged. A left-sided chest port is noted ending about
the distal SVC. No acute osseous abnormalities are seen.
IMPRESSION: Cardiomegaly.  Lungs remain grossly clear.

## 2018-06-30 IMAGING — DX DG CHEST 2V
2 series · 2 of 2 positions shown · non-contrast
Comparison: 08/02/2015

CLINICAL DATA: Sickle cell crisis with shortness of breath and
cough

EXAM:
CHEST  2 VIEW

[chest pa]
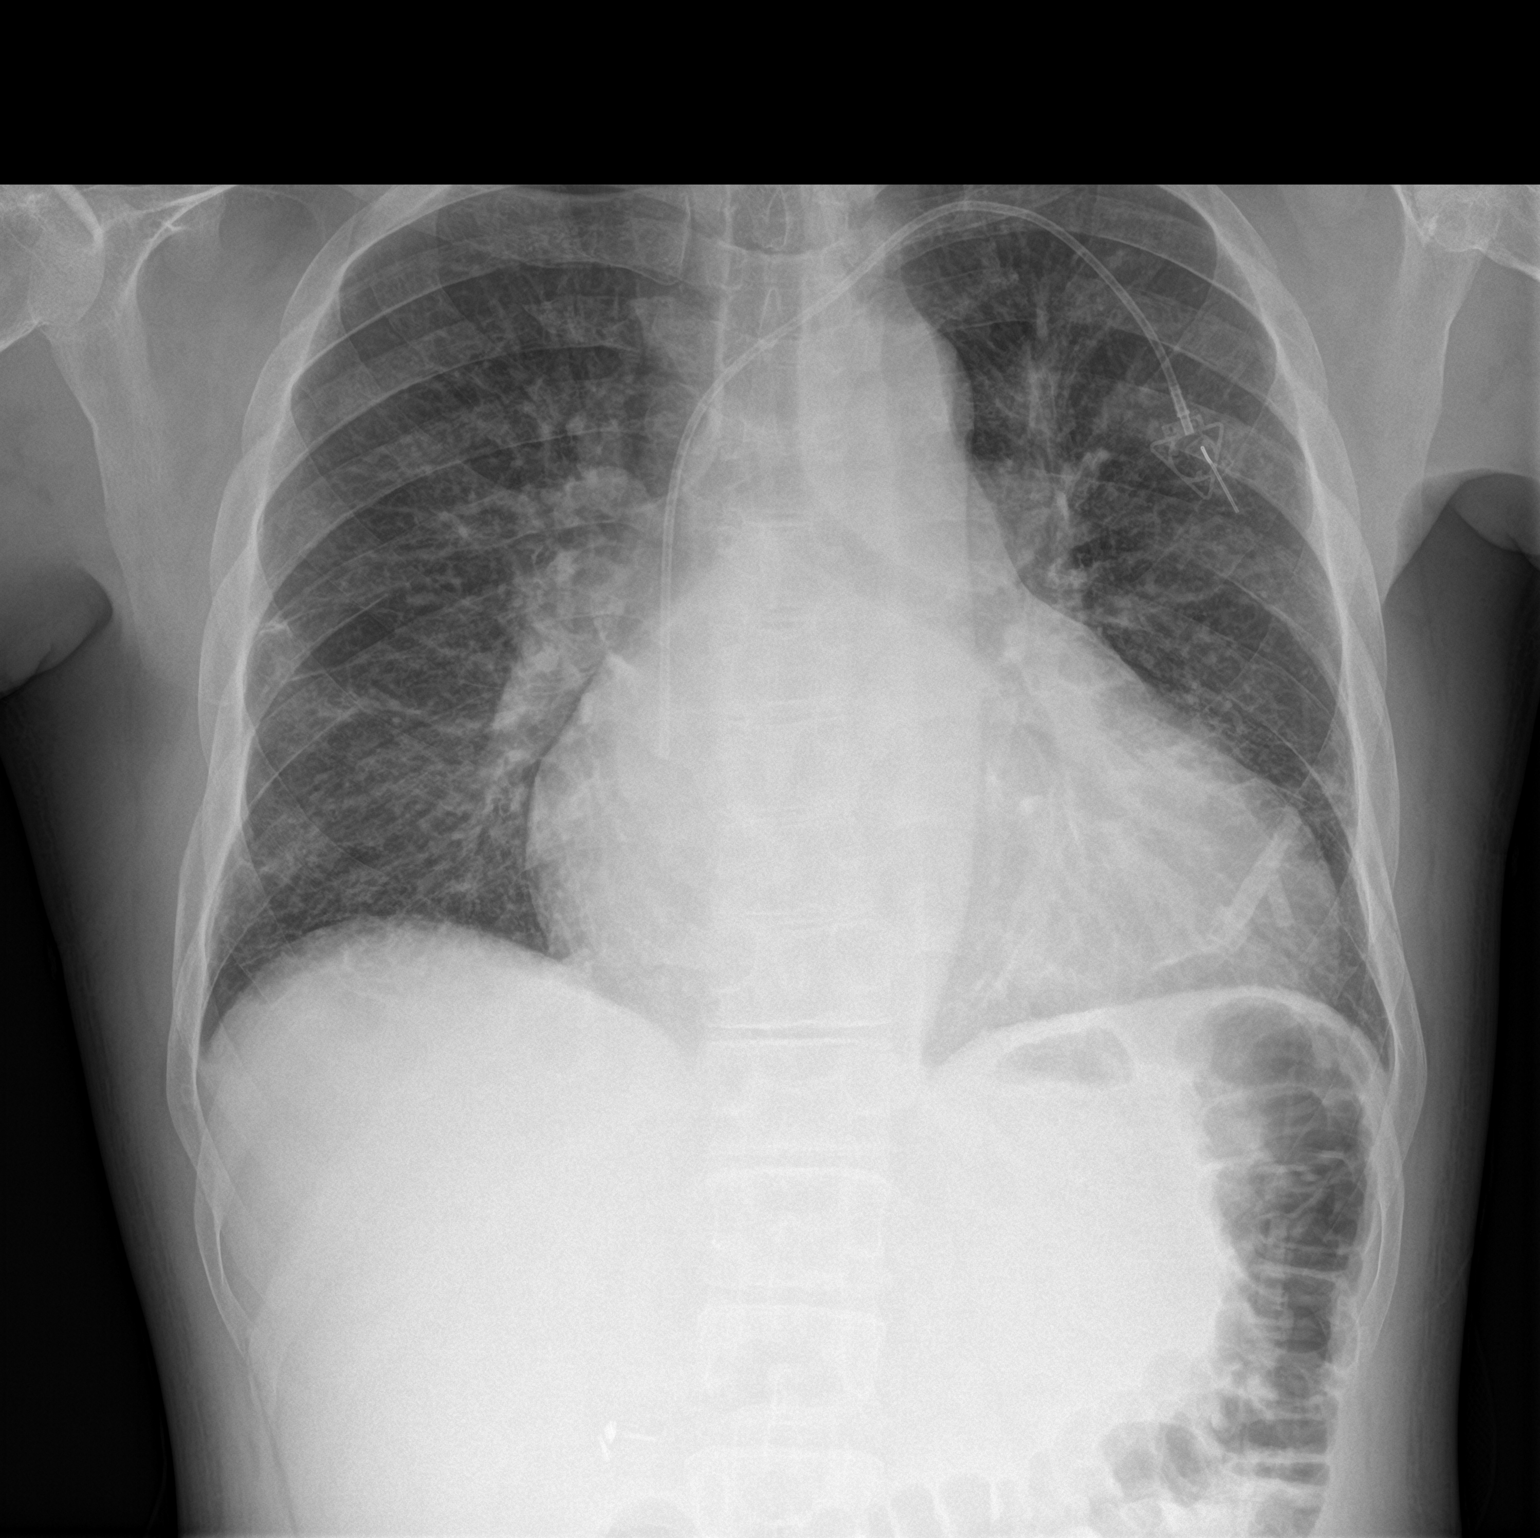

[chest lat]
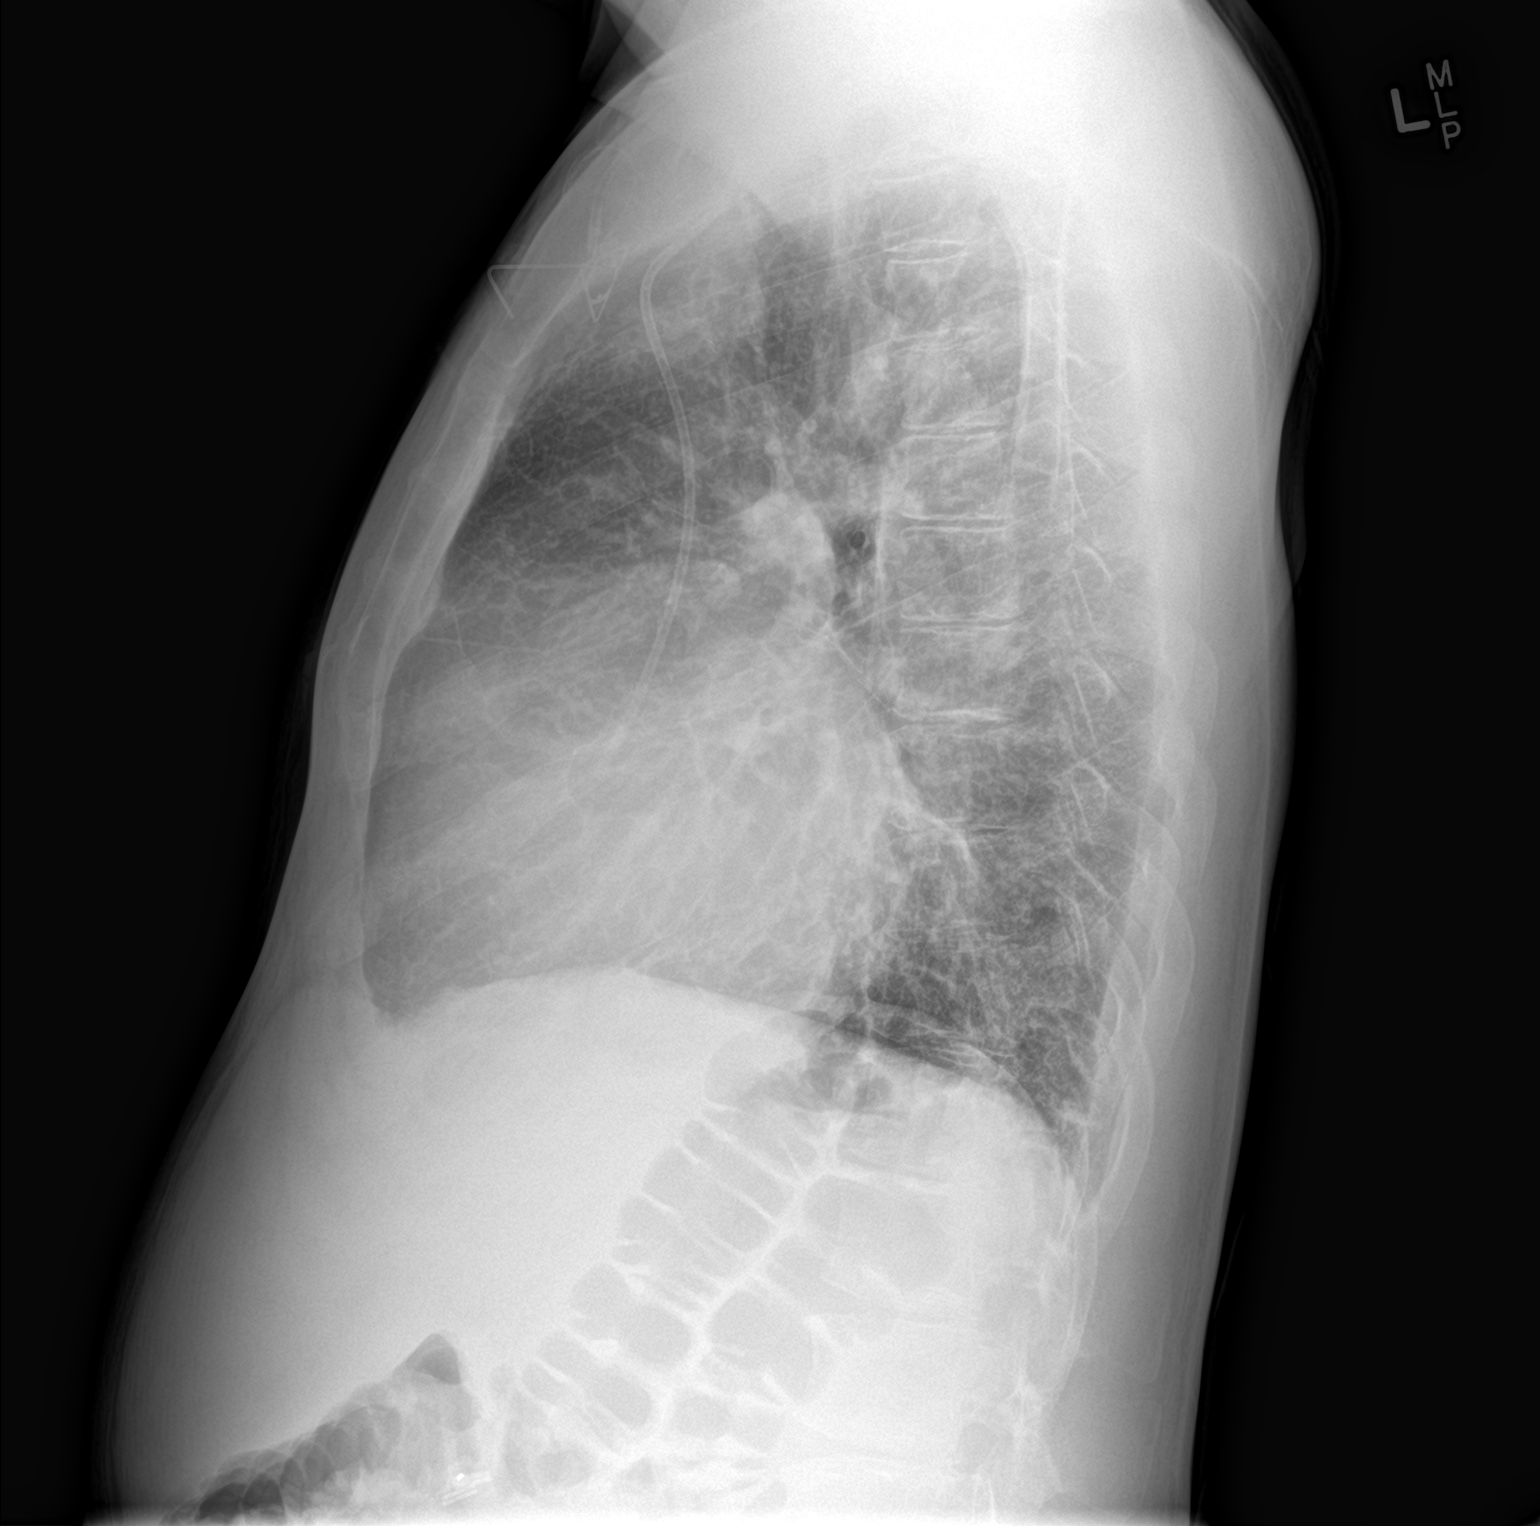

[2 of 2 positions shown; findings below may reference images not displayed]

FINDINGS: Cardiac shadow remains enlarged. Left chest wall port is again seen
and stable. Mild interstitial changes are noted throughout both
lungs. No acute bony abnormality is noted.
IMPRESSION: Mild increased interstitial changes without focal infiltrate.

## 2018-07-21 IMAGING — CR DG CHEST 2V
2 series · 2 of 2 positions shown · non-contrast
Comparison: PA and lateral chest x-ray August 07, 2015

CLINICAL DATA: Sickle cell crisis, chest pain, shortness of breath

EXAM:
CHEST  2 VIEW

[x chest ap]
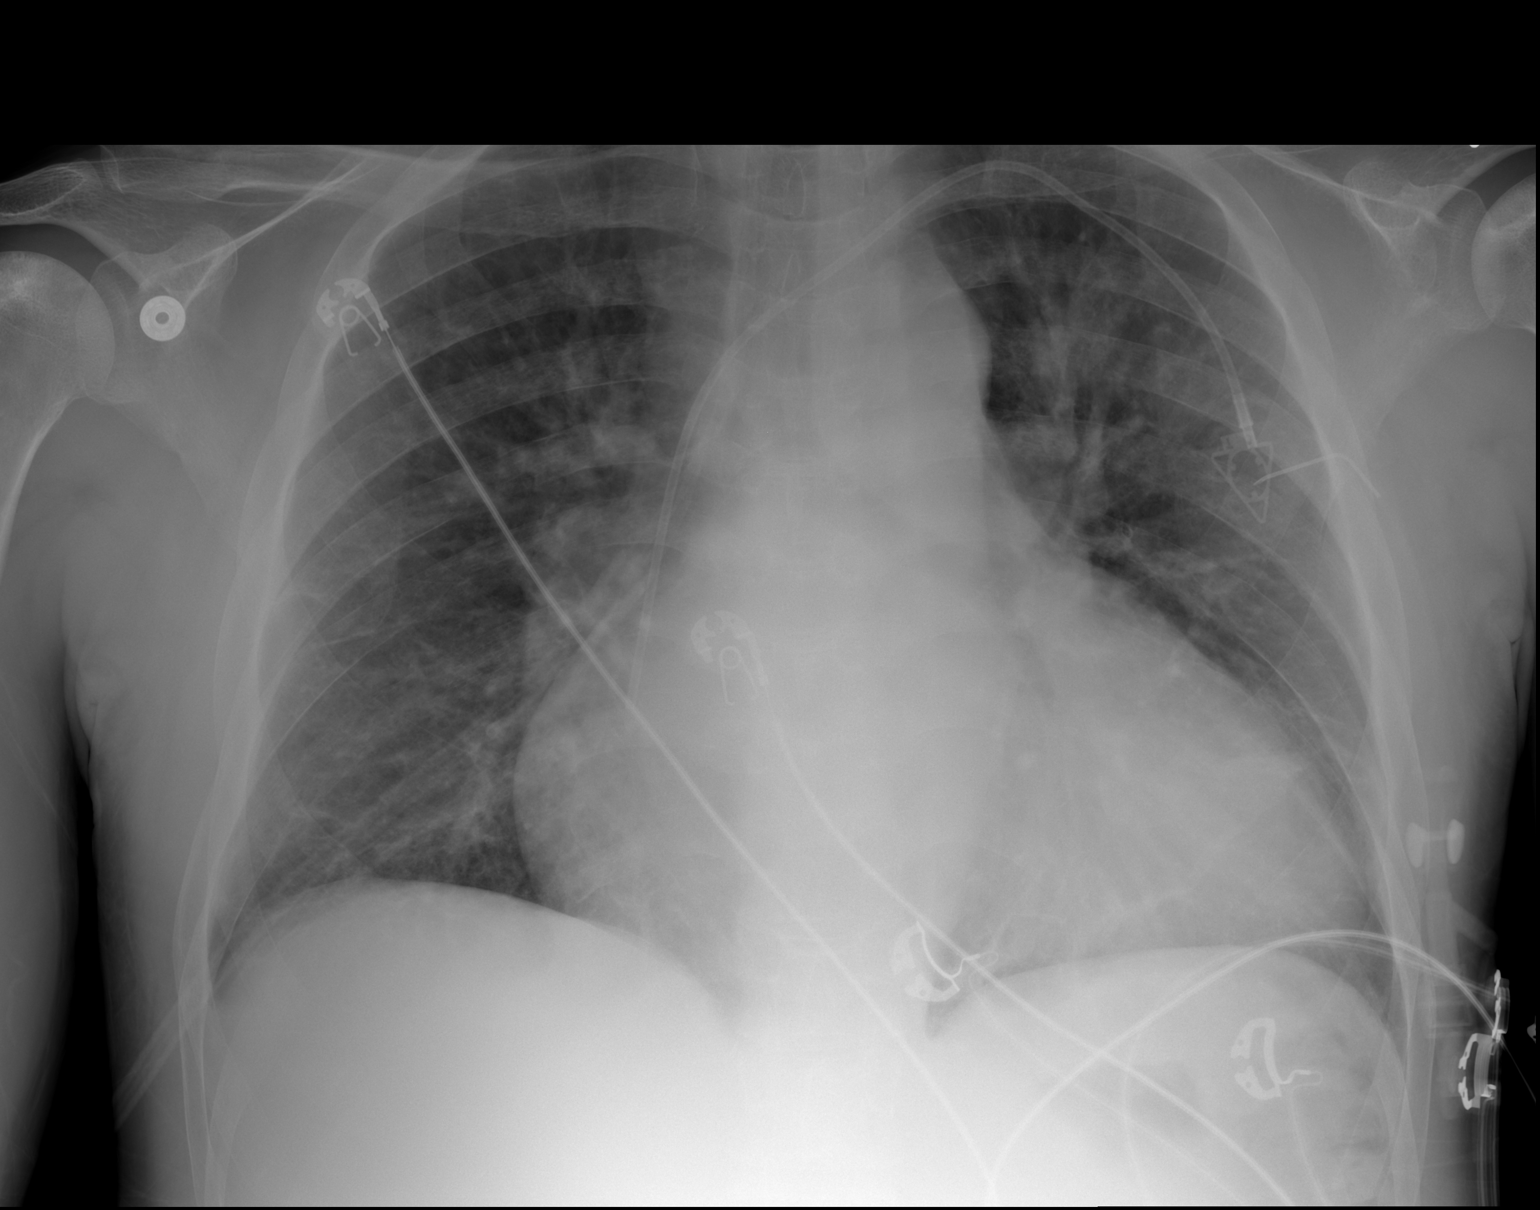

[w chest lat]
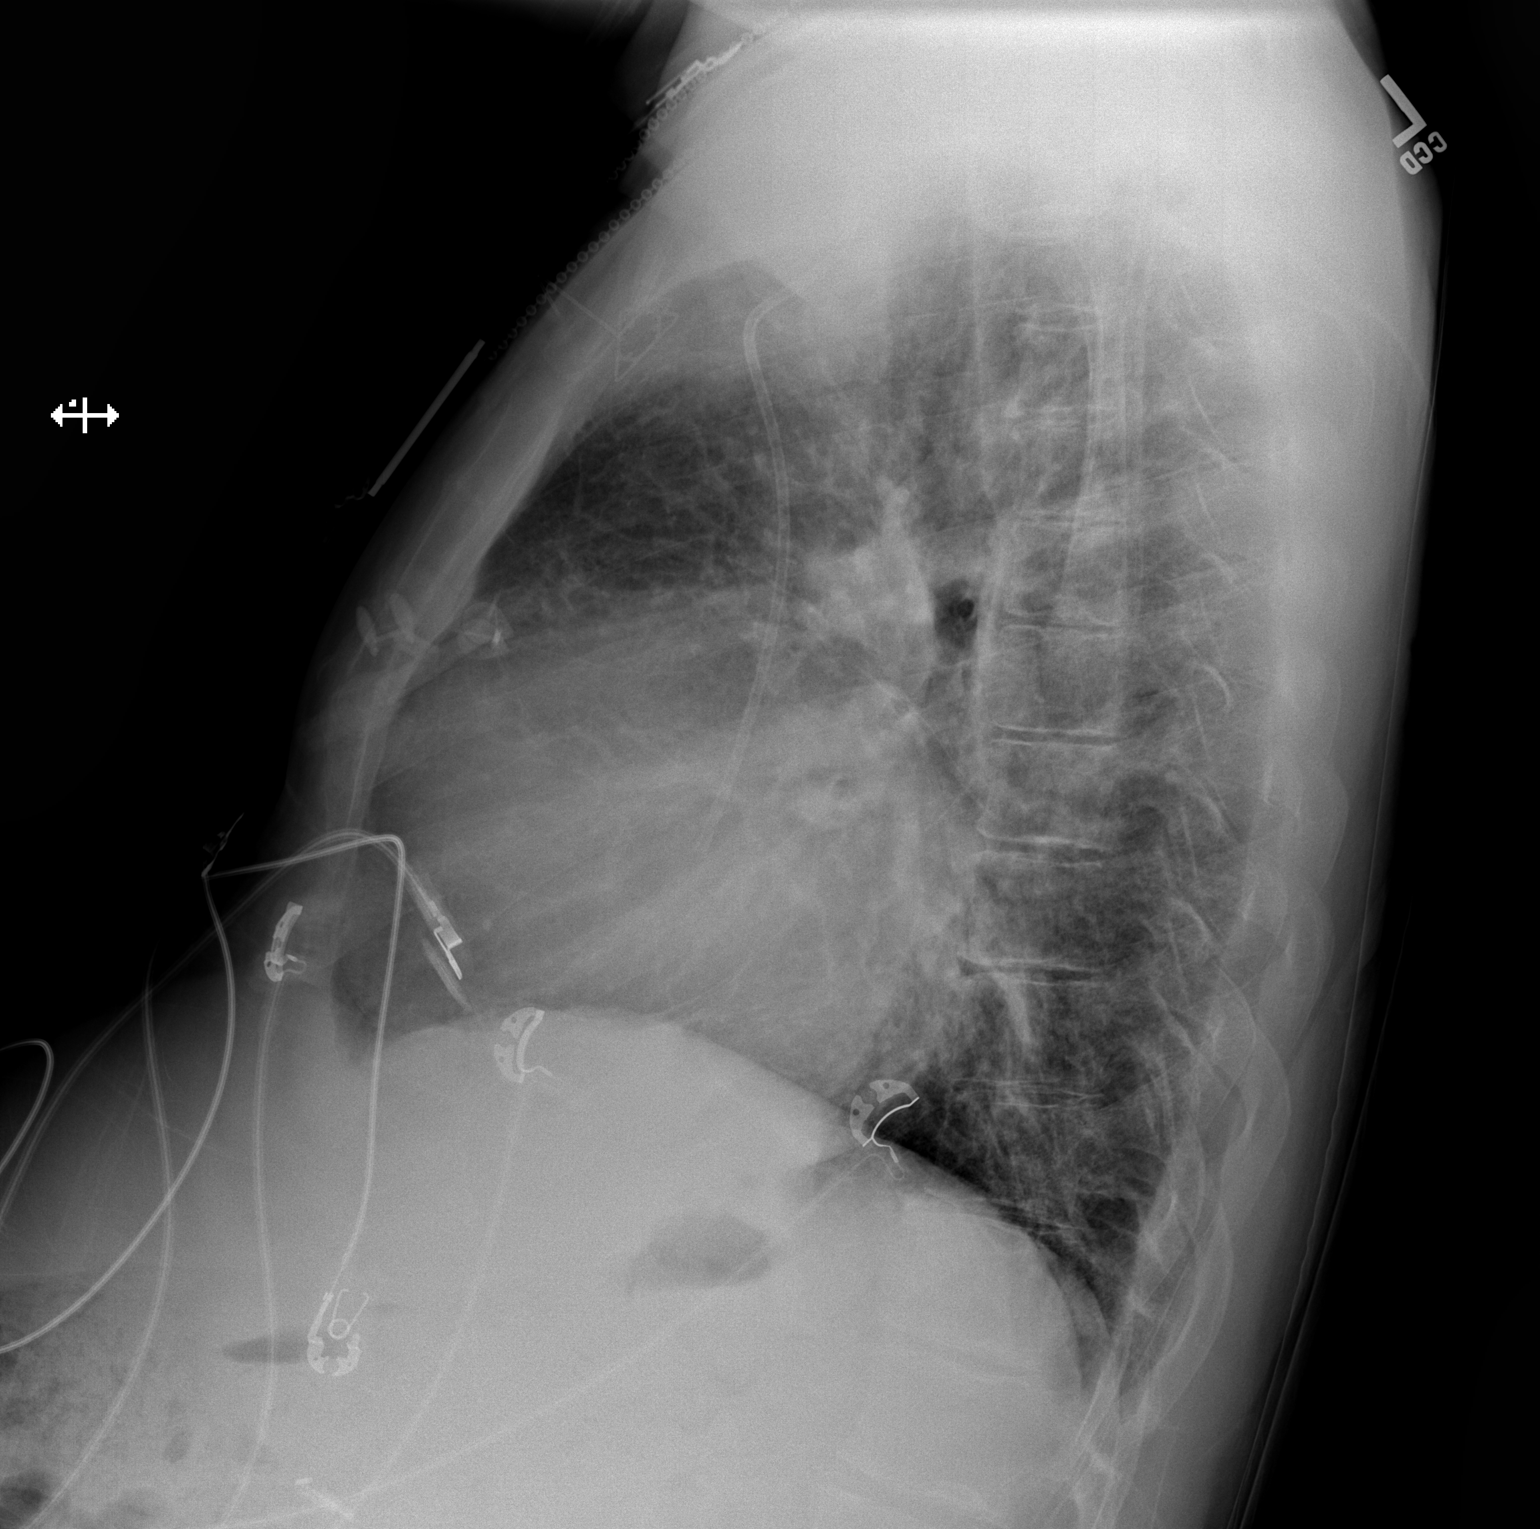

[2 of 2 positions shown; findings below may reference images not displayed]

FINDINGS: The lungs are well-expanded. The interstitial markings are
increased. Confluent alveolar opacity is present in the left upper
lobe. The cardiac silhouette is chronically enlarged. The pulmonary
vascularity is chronically engorged centrally. There is no pleural
effusion. There is mild multilevel degenerative disc disease of the
thoracic spine. The Port-A-Cath appliance catheter tip projects over
the distal third of the SVC.
IMPRESSION: Chronic cardiomegaly and central pulmonary vascular congestion.
Patchy alveolar opacity in the left upper leg and may reflect
pneumonia or confluent edema.

## 2018-07-25 IMAGING — DX DG CHEST 2V
2 series · 2 of 2 positions shown · non-contrast
Comparison: 08/28/2015

CLINICAL DATA: Sickle cell crisis.  Hypoxemia.  Weakness today.

EXAM:
CHEST  2 VIEW

[chest lat]
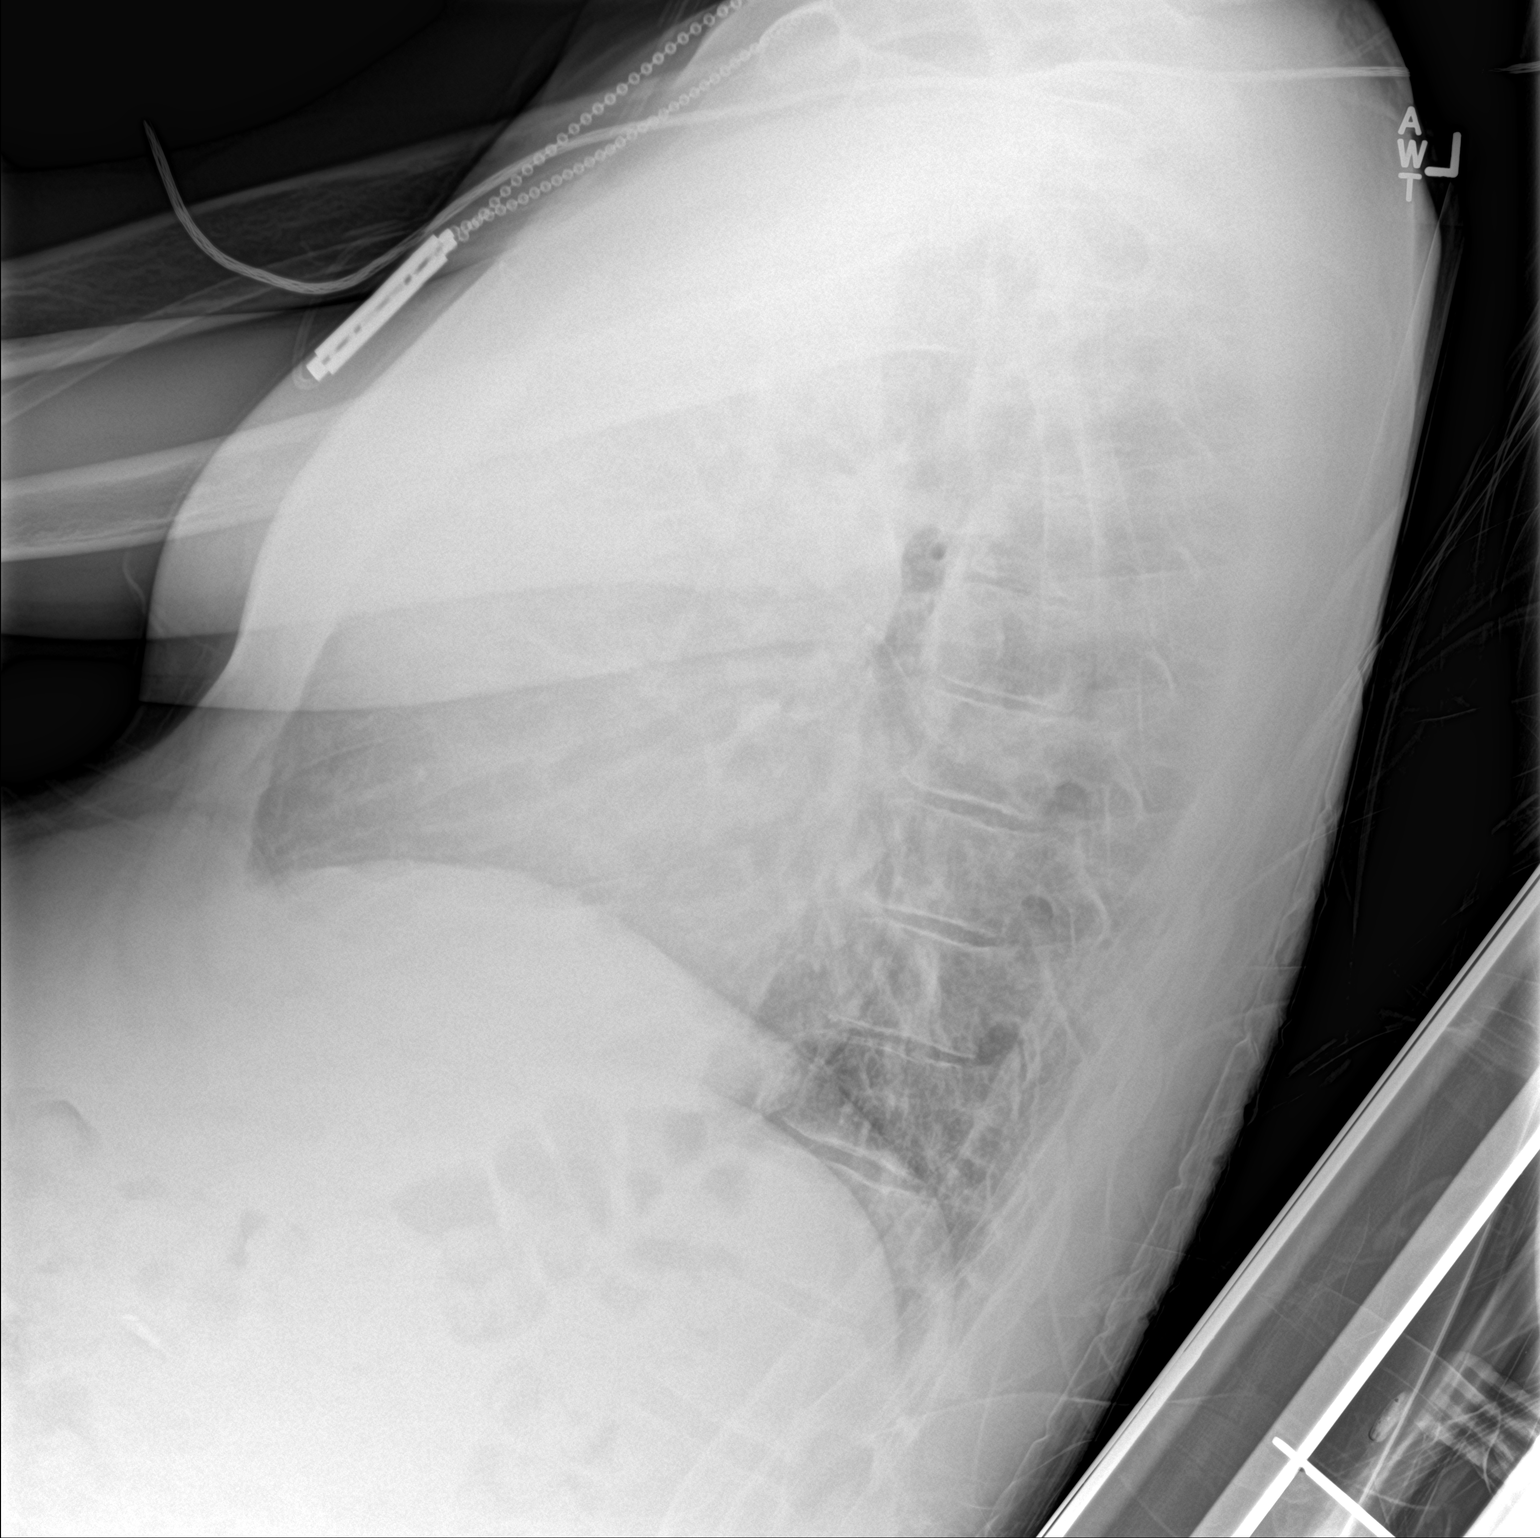

[chest ap]
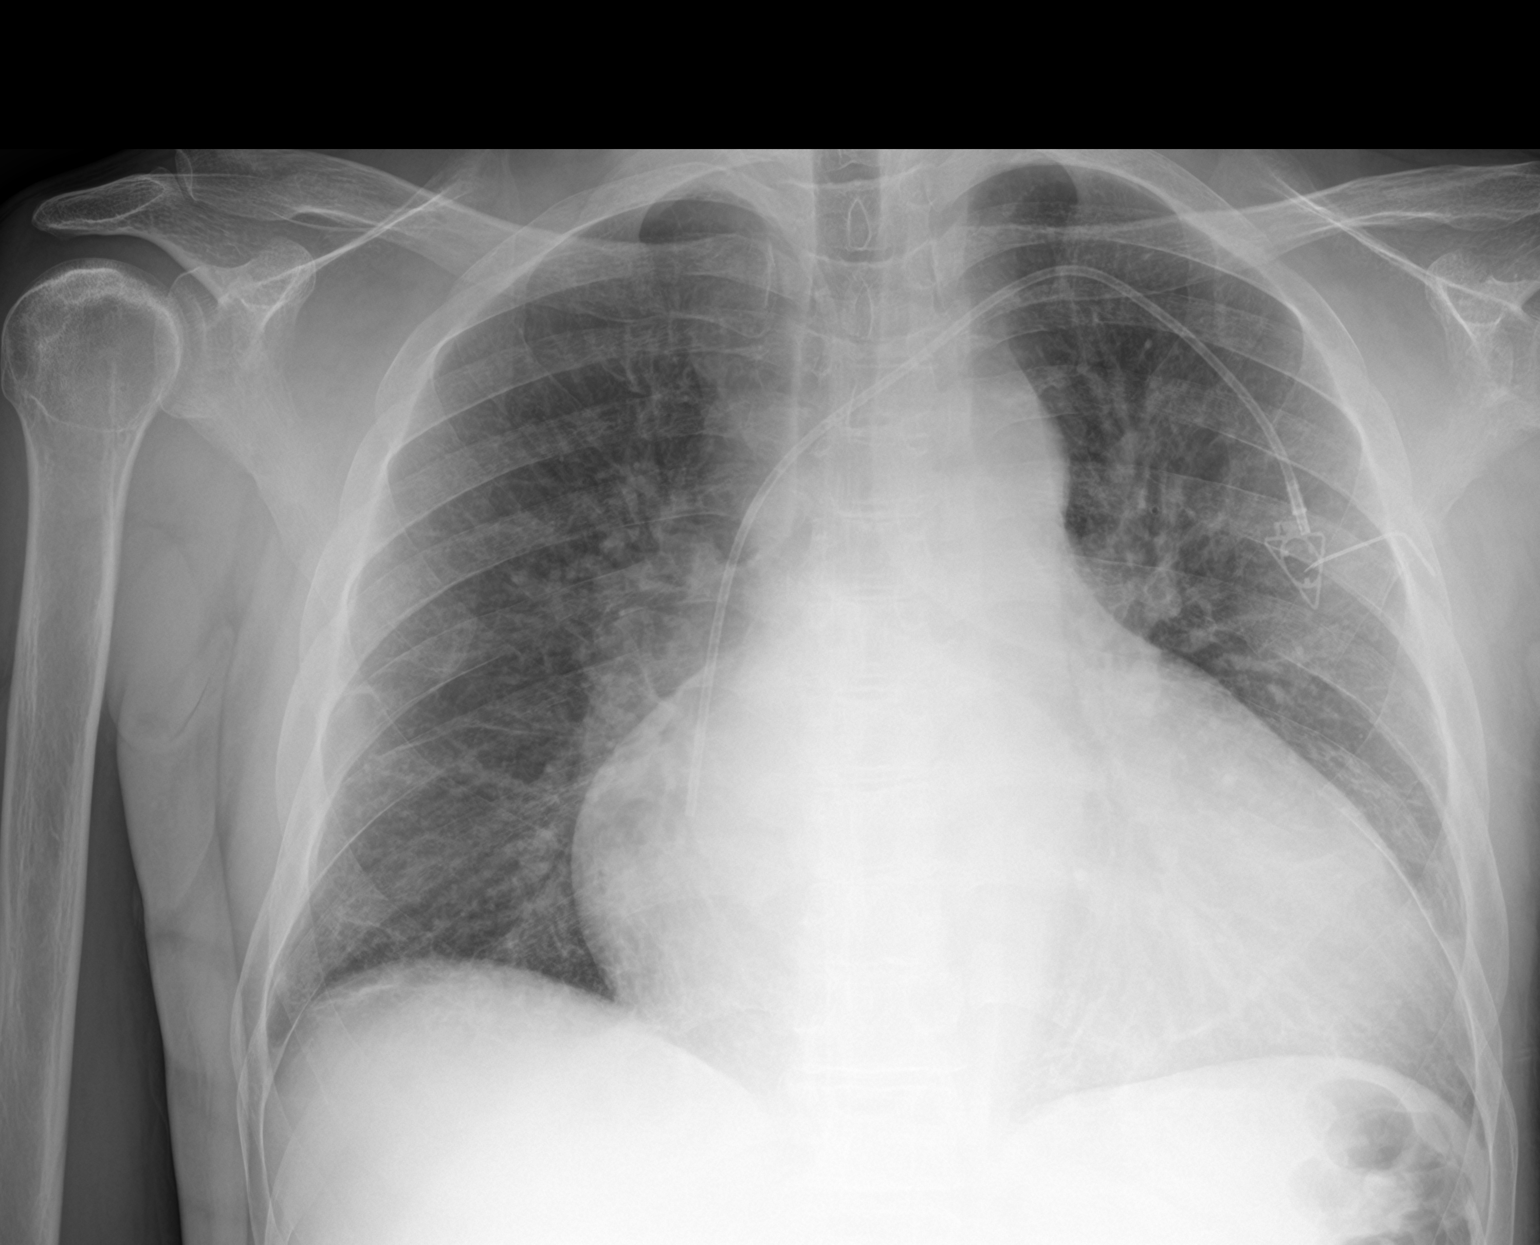

[2 of 2 positions shown; findings below may reference images not displayed]

FINDINGS: Left-sided Port-A-Cath tip to the level of the lower superior vena
cava -right atrial junction. Heart is markedly enlarged and stable
in configuration. There is mild pulmonary vascular congestion. No
overt edema. There is minimal right lower lobe atelectasis. No
pleural effusions or consolidations. Surgical clips are noted in the
upper abdomen. Note is made of sclerosis within the right humeral
head, raising the question of avascular necrosis. Left humeral head
is not imaged today.
IMPRESSION: Cardiomegaly and vascular congestion.  Right lower lobe atelectasis.

Question avascular necrosis of the right humeral head.

## 2018-08-11 IMAGING — CR DG LUMBAR SPINE COMPLETE 4+V
5 series · 5 of 5 positions shown · non-contrast
Comparison: CT of the abdomen and pelvis from 06/03/2015

CLINICAL DATA: Status post fall, with lower back pain radiating to
both hips. Initial encounter.

EXAM:
LUMBAR SPINE - COMPLETE 4+ VIEW

[t lumbar spine ap]
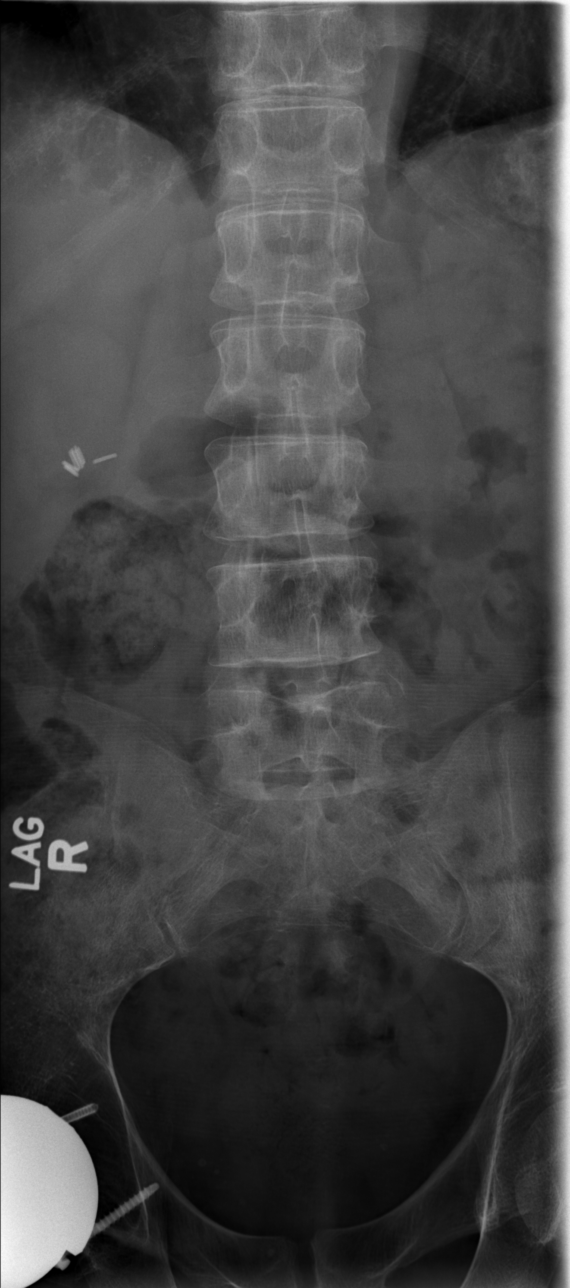

[t lumbar spine obl (1 of 2)]
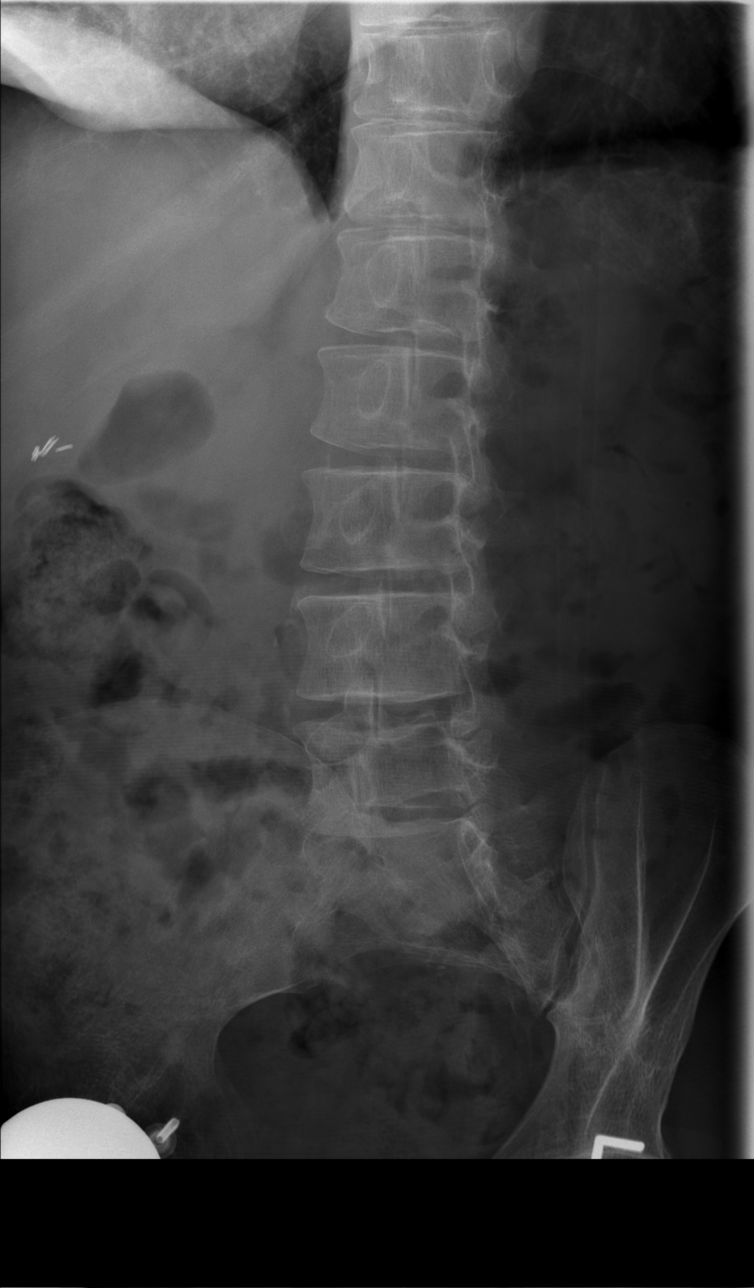

[t lumbar spine obl (2 of 2)]
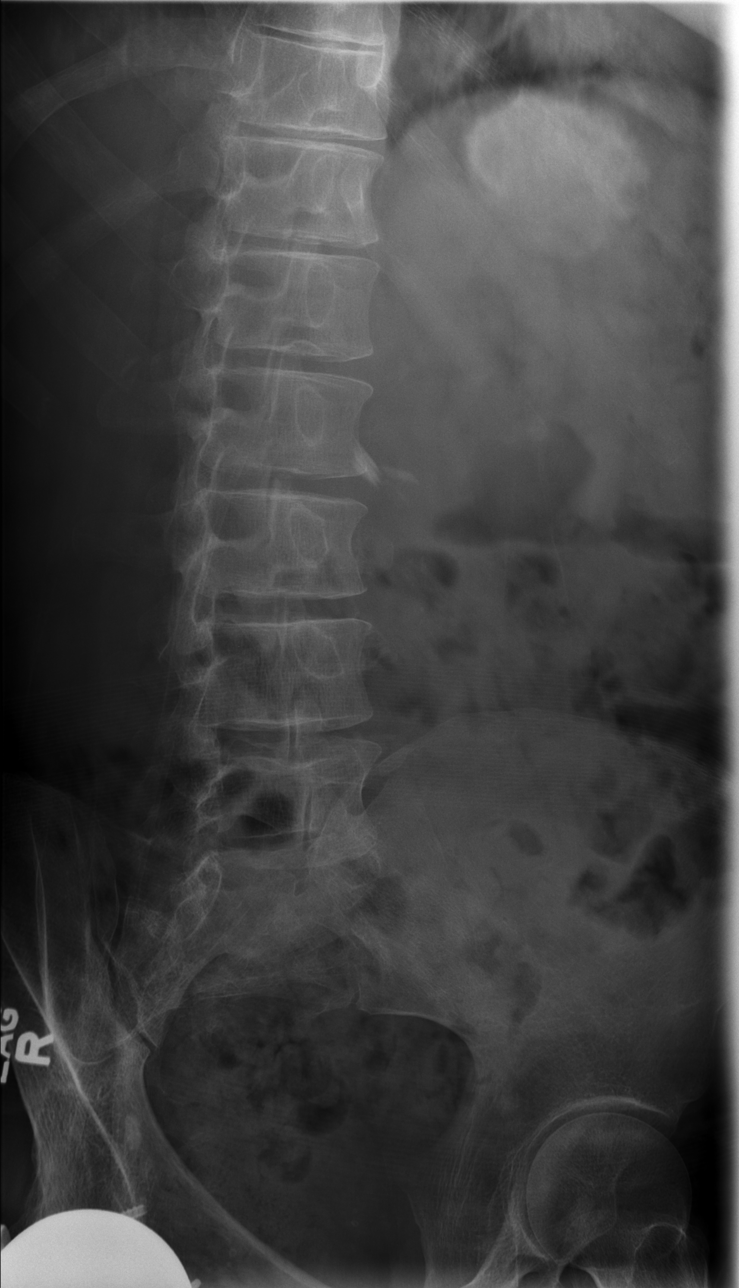

[t lumbar spine lat]
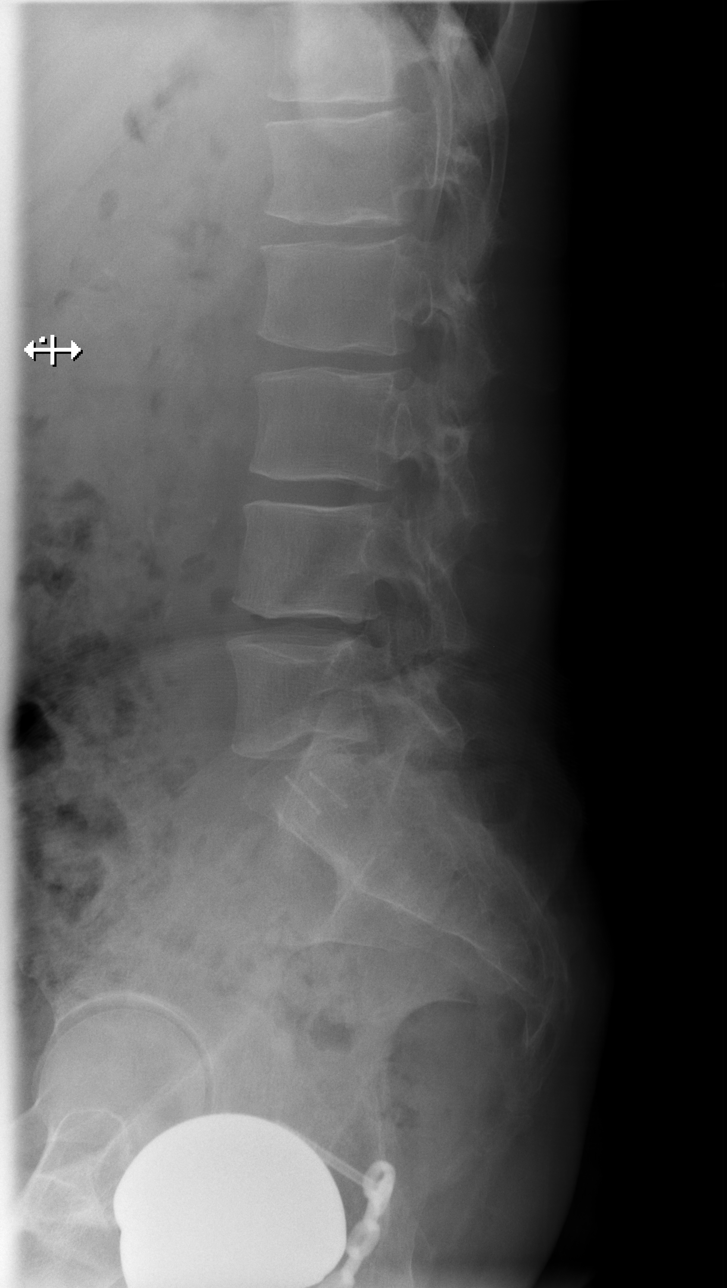

[t lumbar l-5 s-1 spot]
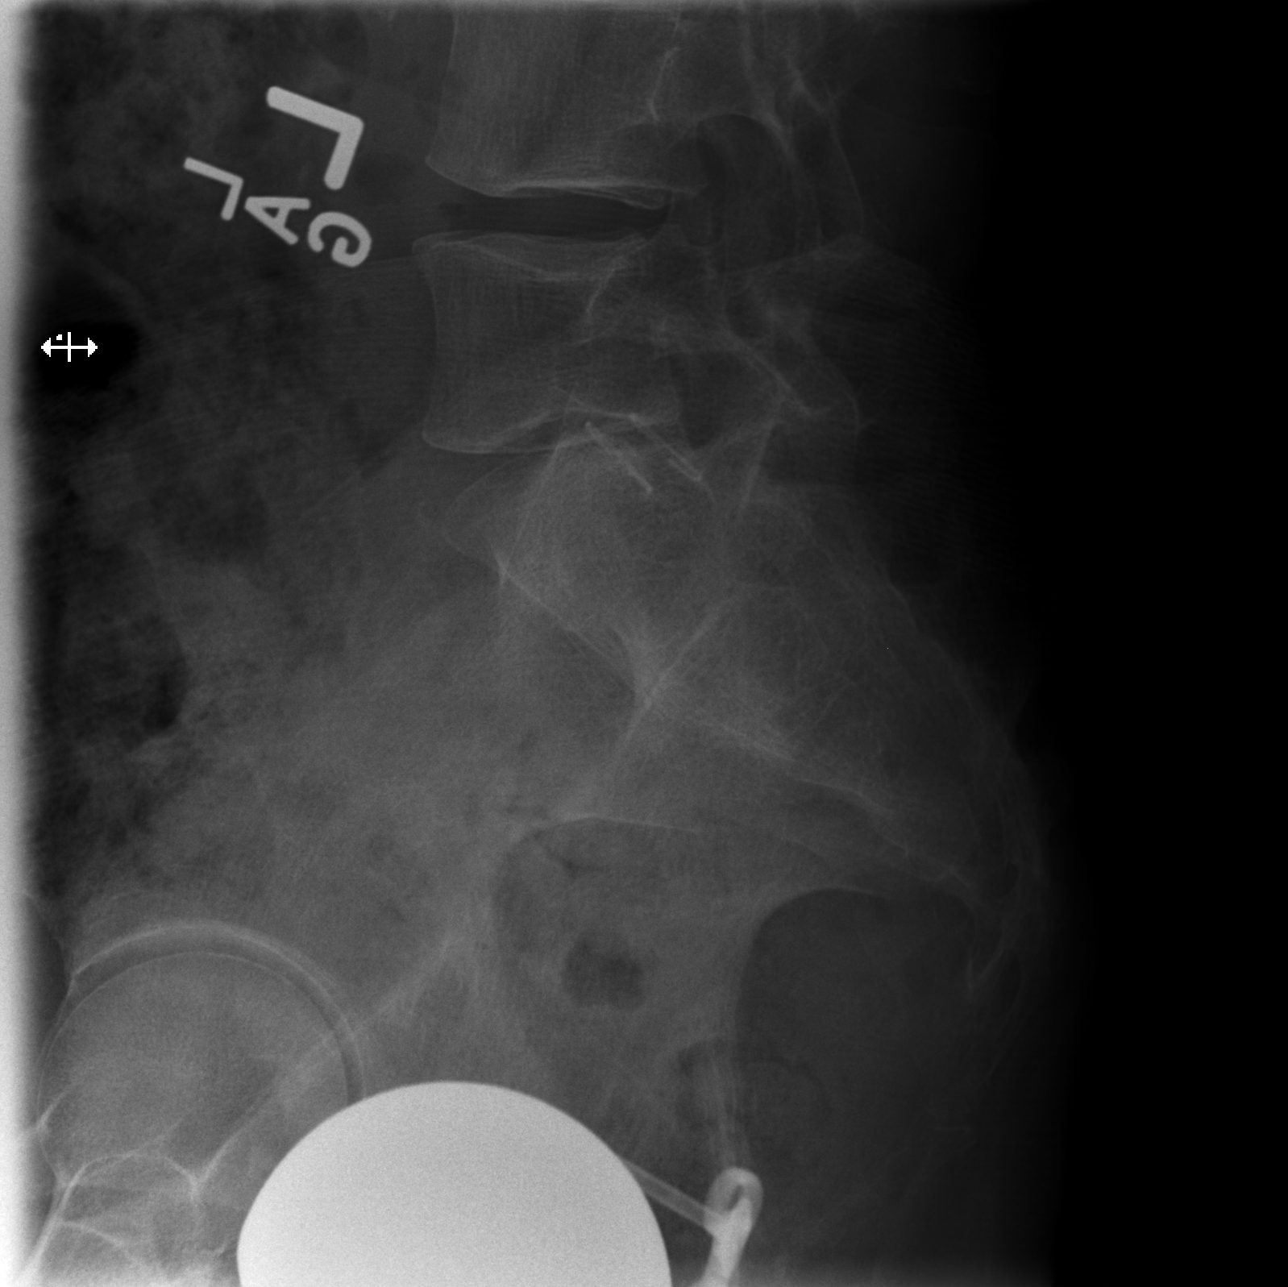

[5 of 5 positions shown; findings below may reference images not displayed]

FINDINGS: There is no evidence of fracture or subluxation. Vertebral bodies
demonstrate normal height and alignment. Intervertebral disc spaces
are preserved. The visualized neural foramina are grossly
unremarkable in appearance. A right hip arthroplasty is only
partially imaged but appears grossly unremarkable.

The visualized bowel gas pattern is unremarkable in appearance; air
and stool are noted within the colon. The sacroiliac joints are
within normal limits. Clips are noted within the right upper
quadrant, reflecting prior cholecystectomy.
IMPRESSION: No evidence of fracture or subluxation along the lumbar spine.

## 2018-08-11 IMAGING — CR DG HIP (WITH OR WITHOUT PELVIS) 2-3V*R*
3 series · 3 of 3 positions shown · non-contrast
Comparison: None.

CLINICAL DATA: Status post fall, landing on right side, with right
hip pain. Initial encounter.

EXAM:
DG HIP (WITH OR WITHOUT PELVIS) 2-3V RIGHT

[t pelvis ap]
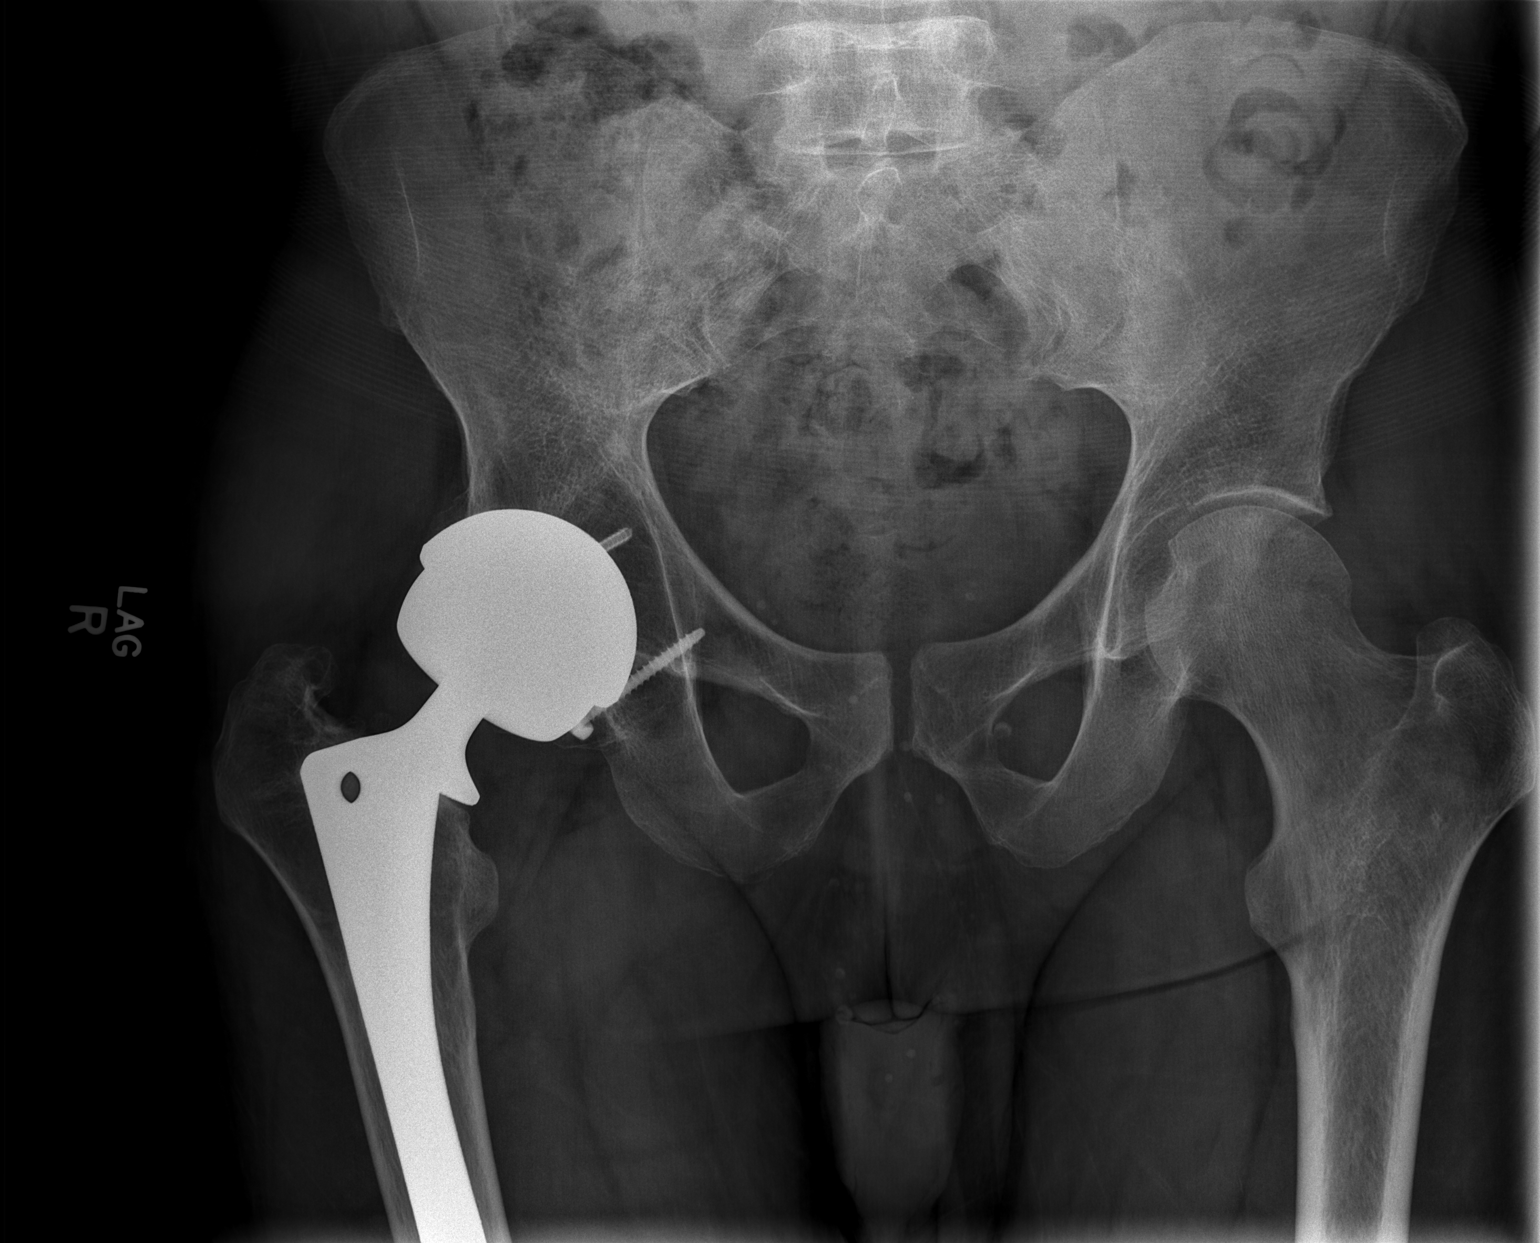

[t hip ap right]
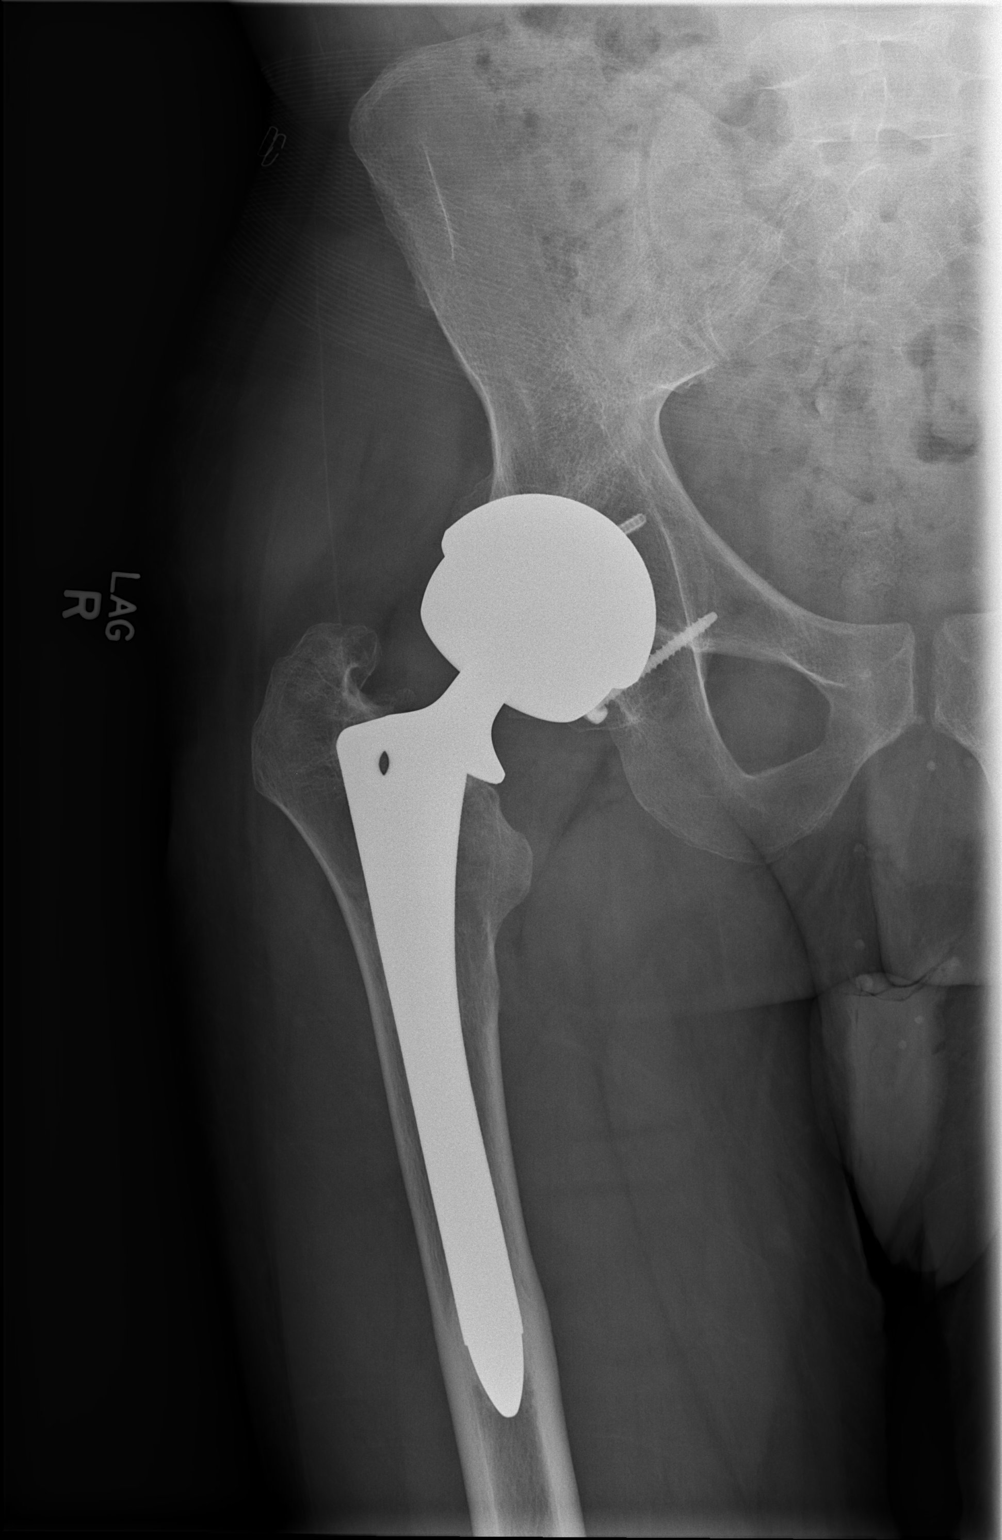

[t hip frog leg right]
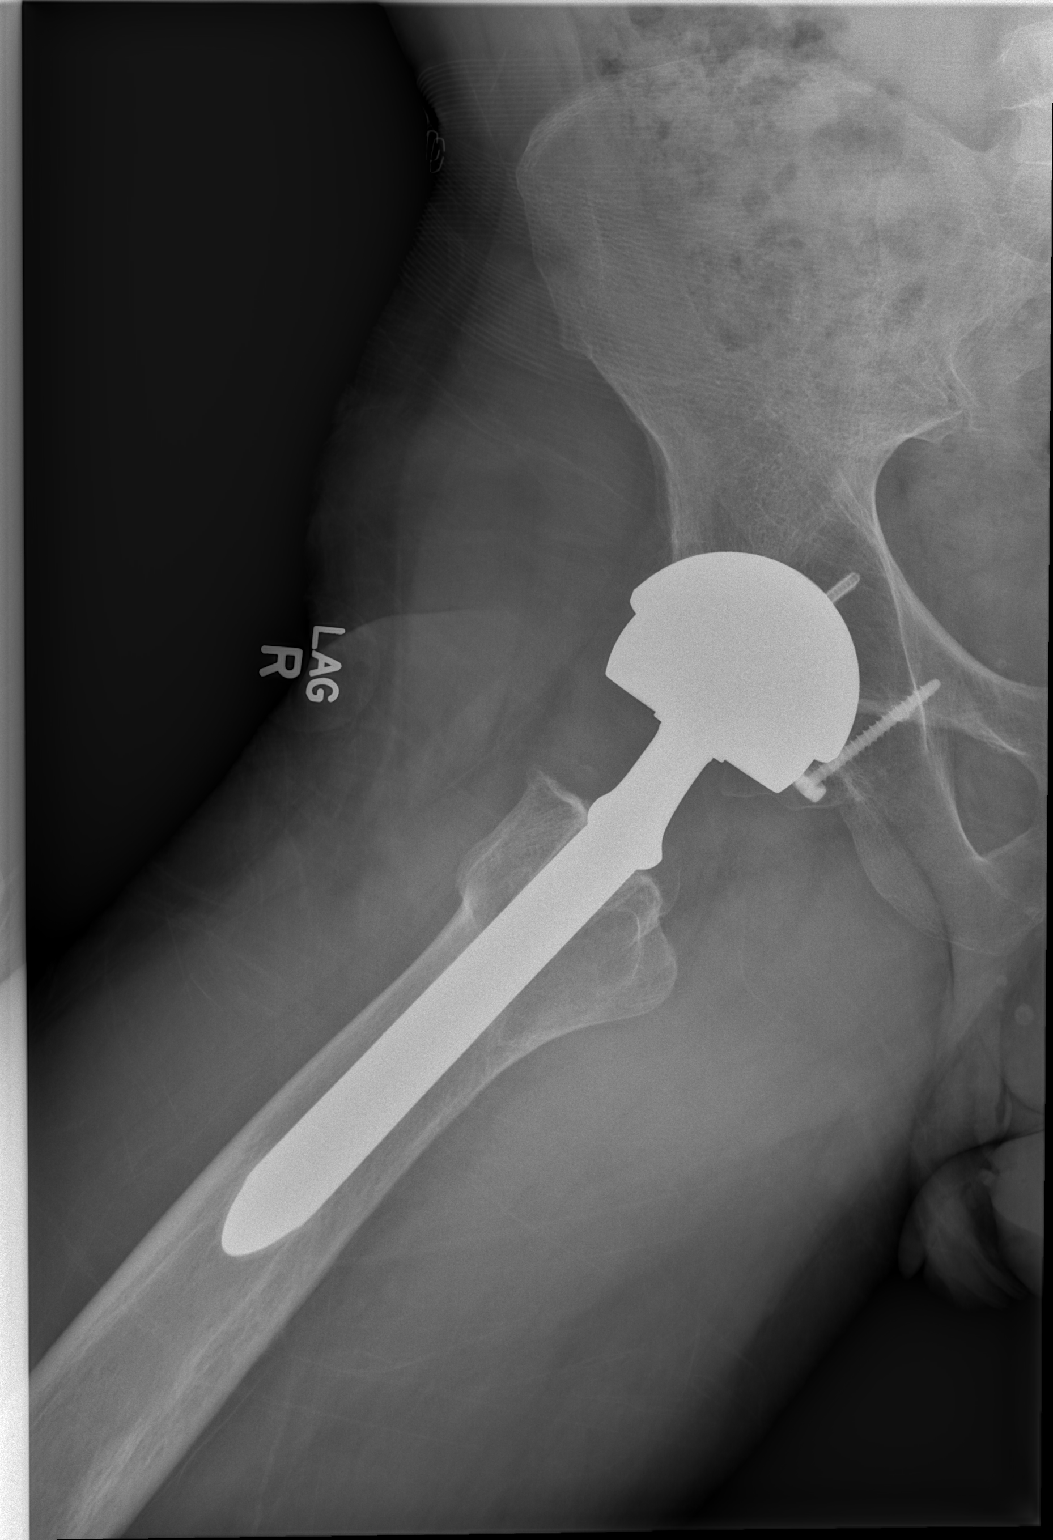

[3 of 3 positions shown; findings below may reference images not displayed]

FINDINGS: There is no evidence of fracture or dislocation. The patient's right
hip arthroplasty is grossly unremarkable in appearance, without
evidence of loosening. Underlying screws are noted along the right
acetabulum. Both femoral heads are seated normally within their
respective acetabula. The proximal right femur appears intact. No
significant degenerative change is appreciated. The sacroiliac
joints are unremarkable in appearance.

The visualized bowel gas pattern is grossly unremarkable in
appearance.
IMPRESSION: No evidence of fracture or dislocation. Right hip arthroplasty is
grossly intact, without evidence of loosening.

## 2018-08-16 IMAGING — DX DG CHEST 2V
2 series · 2 of 2 positions shown · non-contrast
Comparison: 09/18/2015

CLINICAL DATA: Fever today

EXAM:
CHEST  2 VIEW

[chest pa]
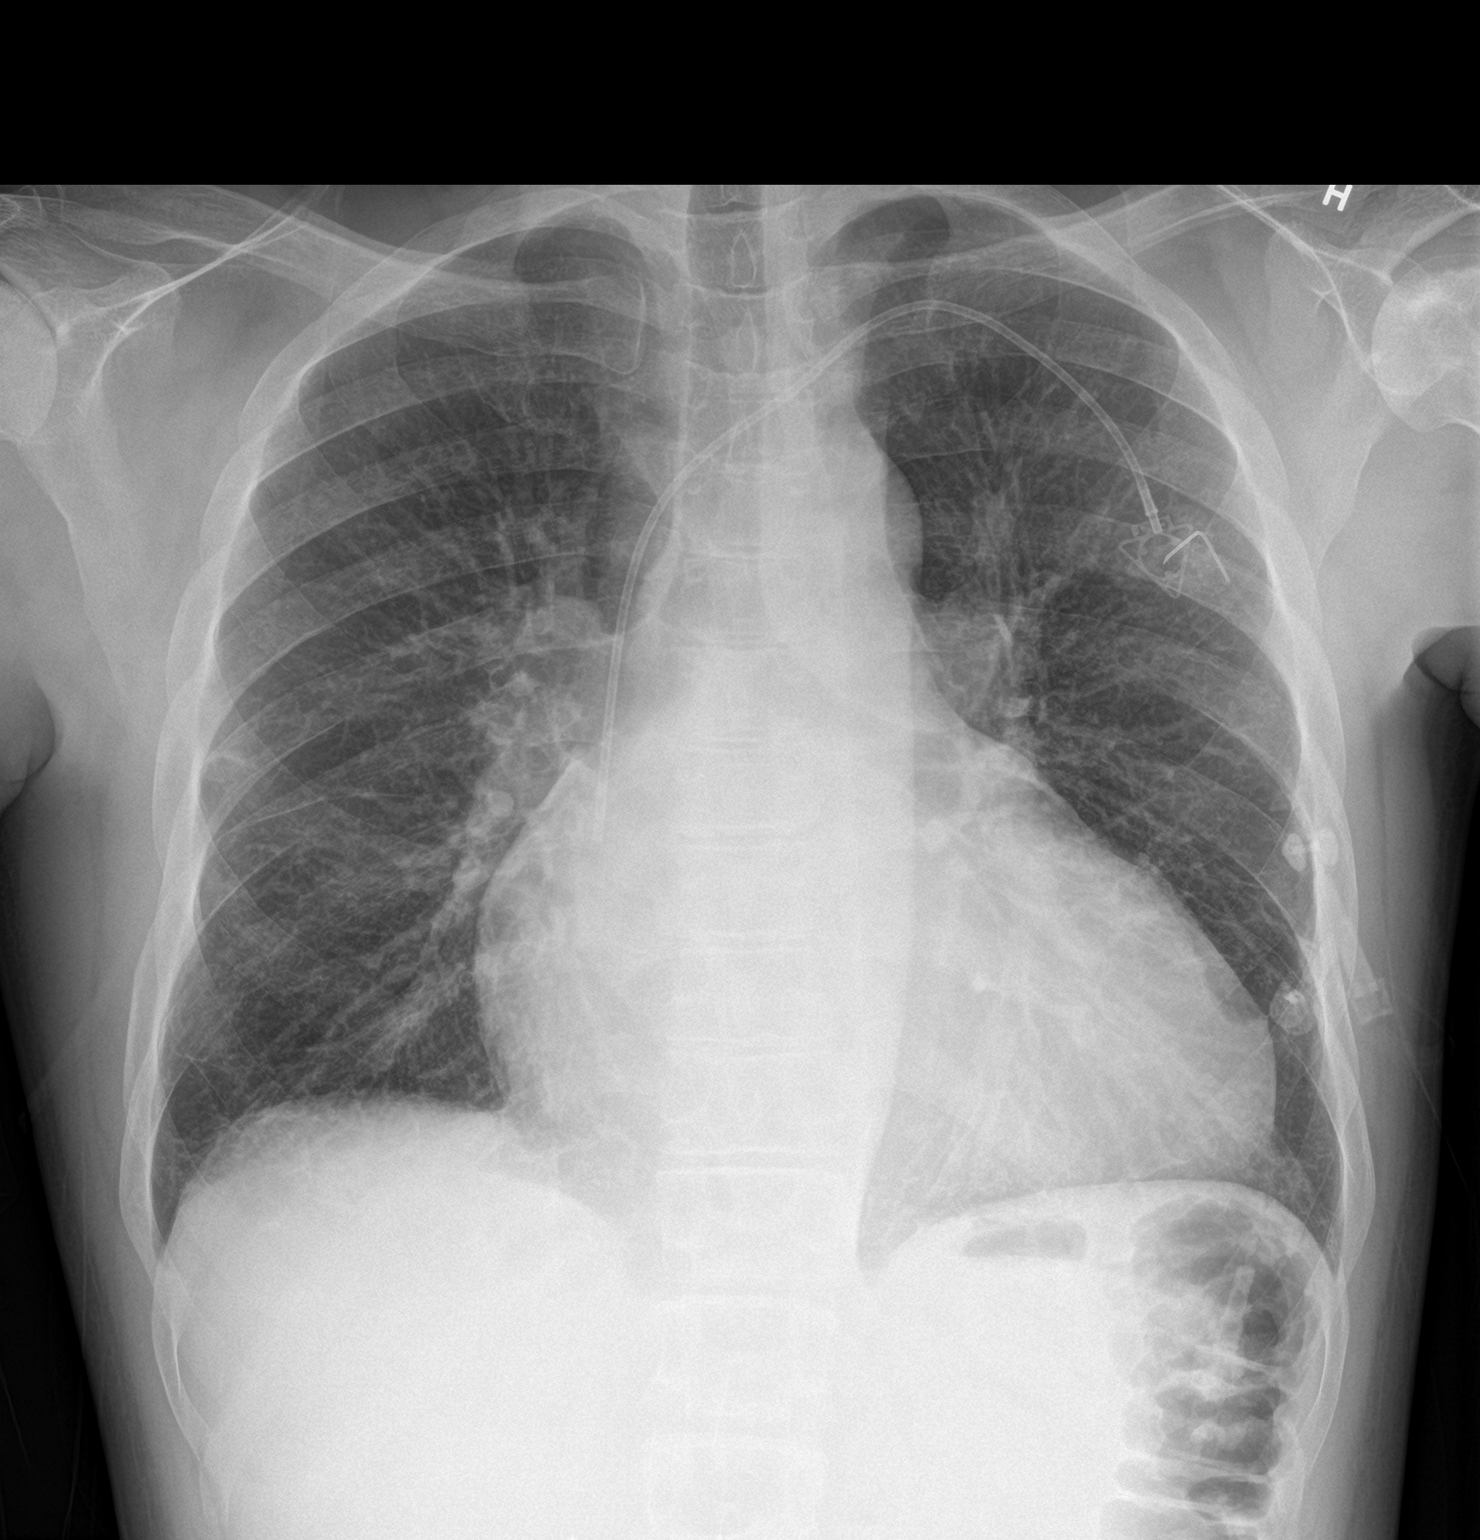

[chest lat]
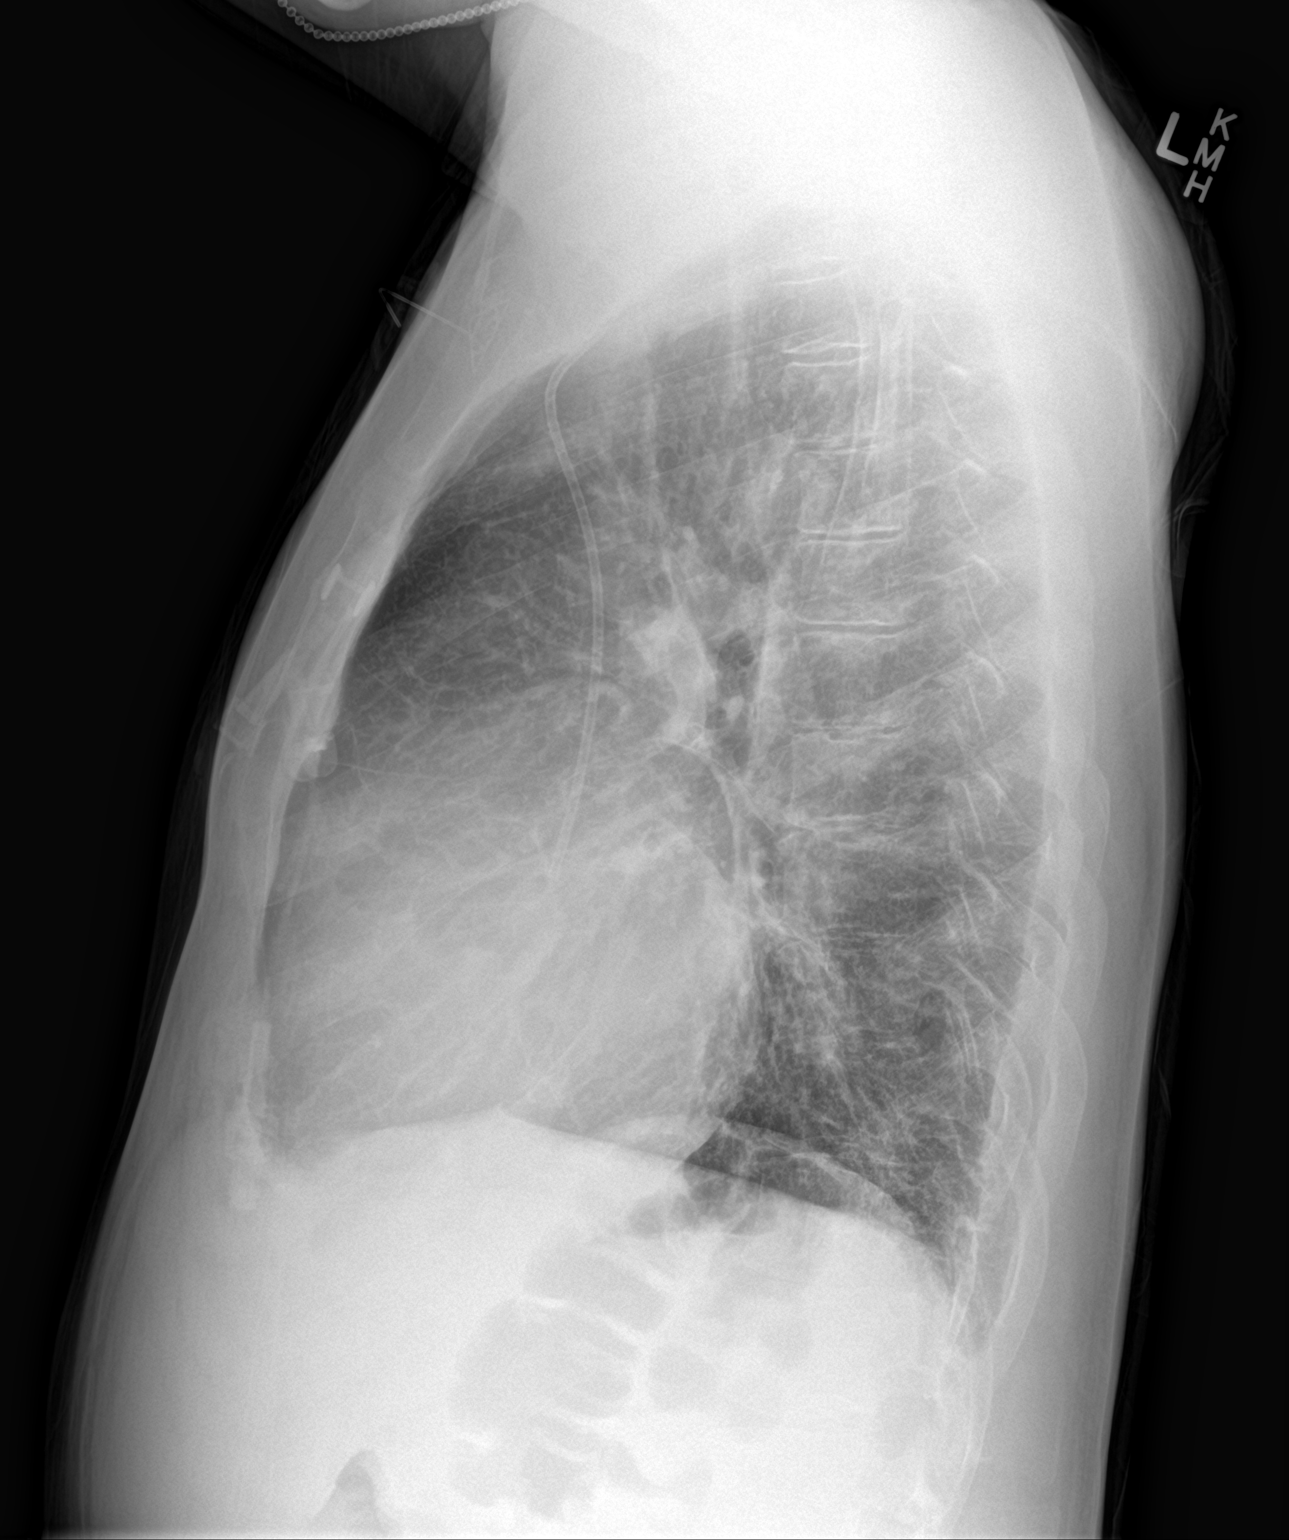

[2 of 2 positions shown; findings below may reference images not displayed]

FINDINGS: Left chest wall port is again seen and stable. Cardiac shadow
remains enlarged. The lungs are well aerated bilaterally. Diffuse
interstitial changes are again seen stable from the prior study. No
focal infiltrate is seen.
IMPRESSION: No acute abnormality noted.

## 2018-08-19 IMAGING — CR DG CHEST 2V
2 series · 2 of 2 positions shown · non-contrast
Comparison: Chest radiograph September 23, 2015

CLINICAL DATA: Bilateral rib pain radiating to back for 2 days.
History of hypertension and sickle cell.

EXAM:
CHEST  2 VIEW

[w chest pa]
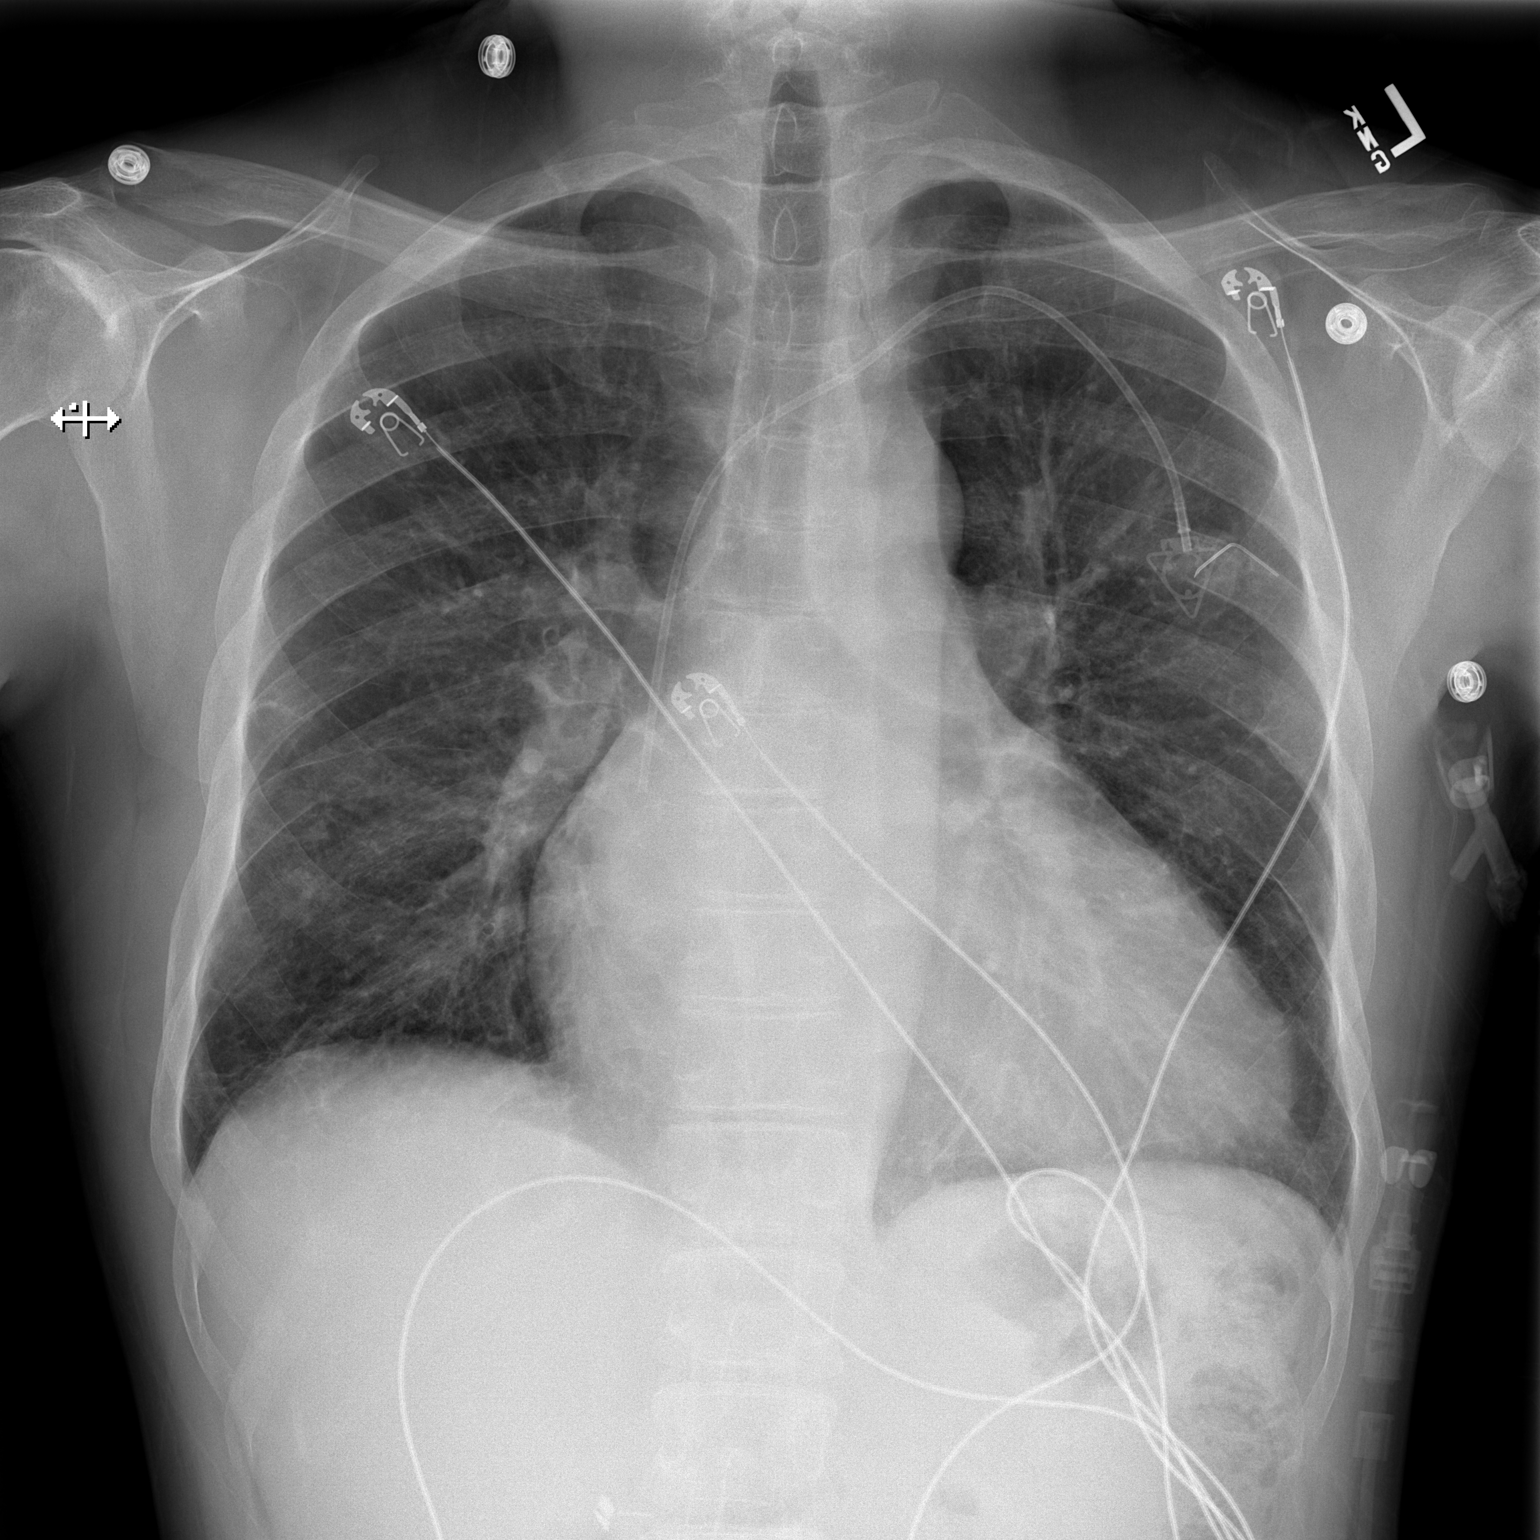

[w chest lat]
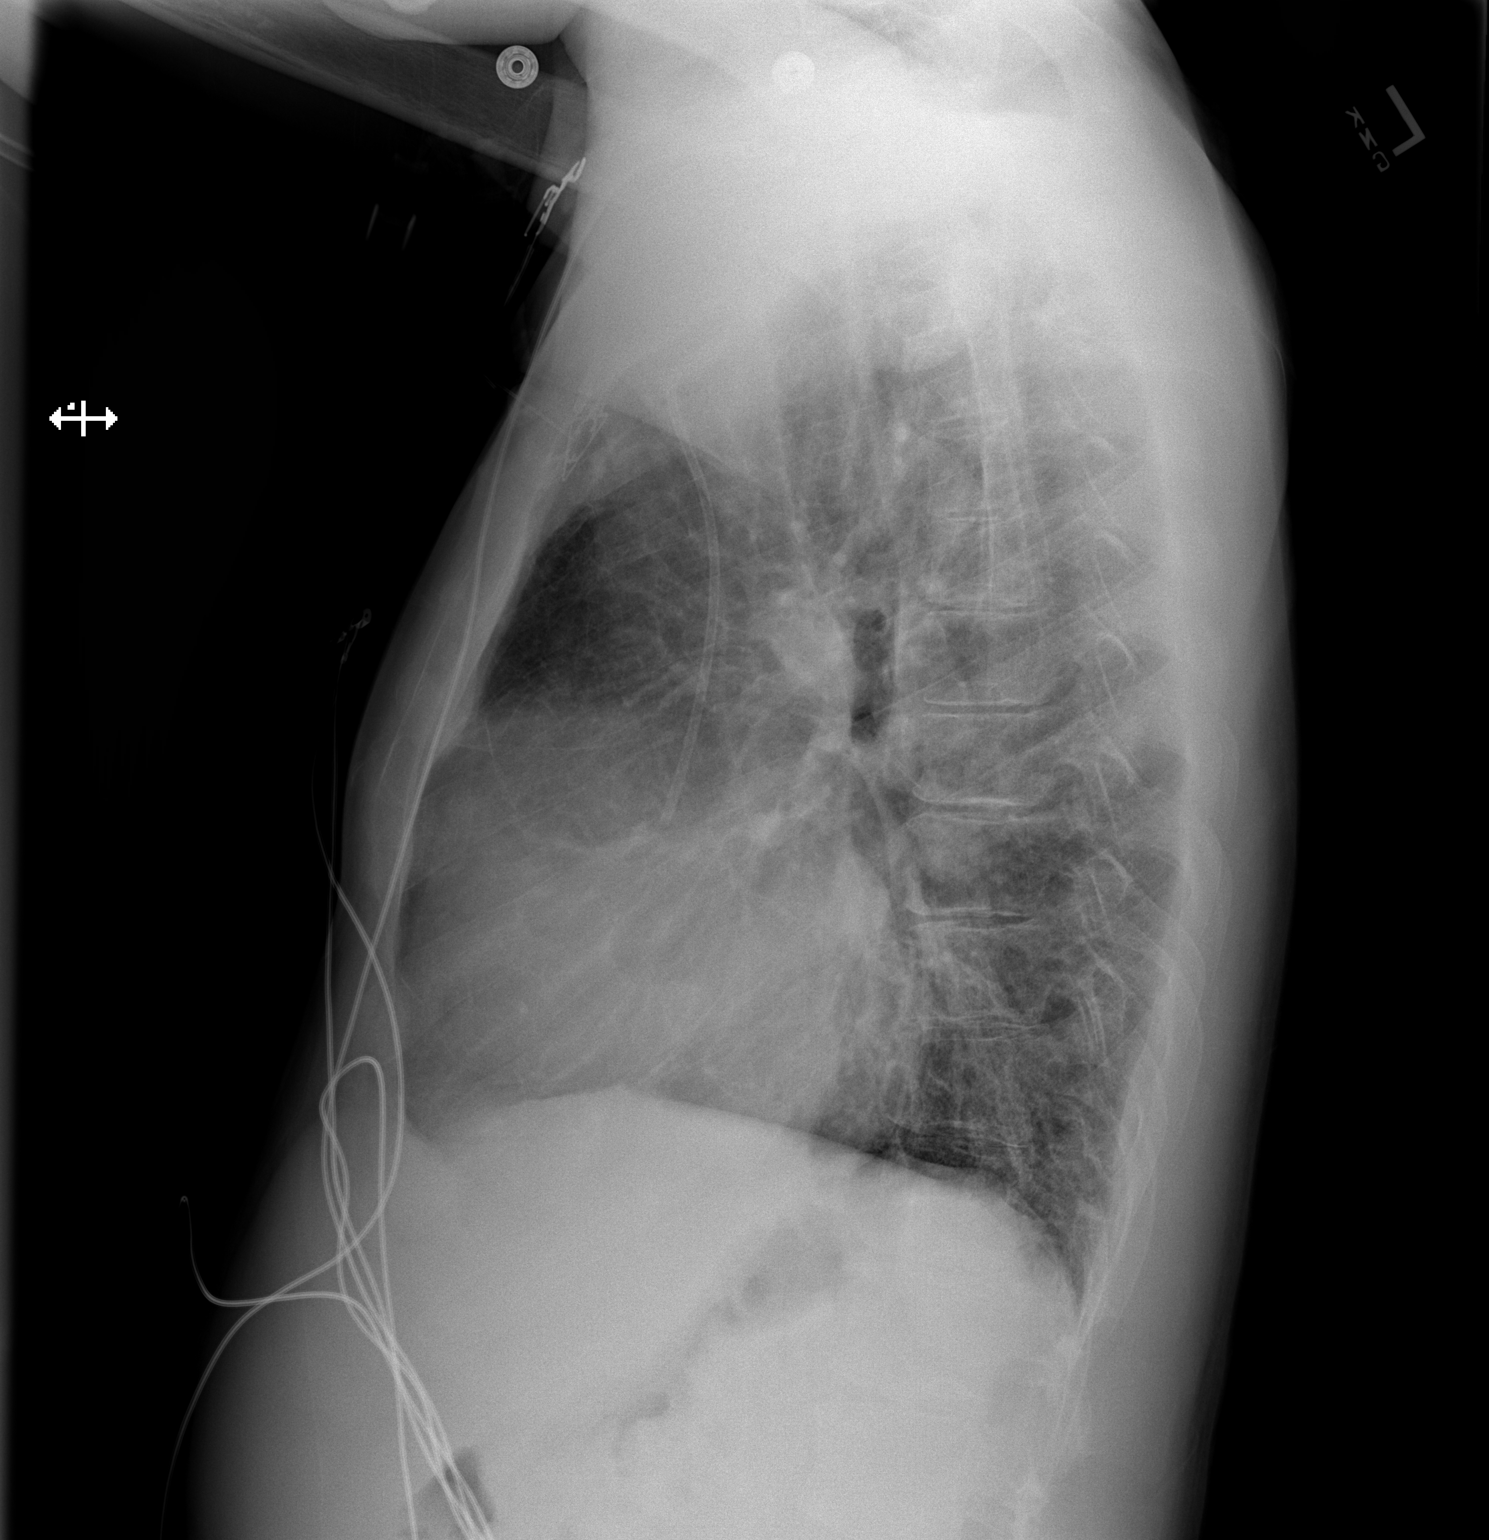

[2 of 2 positions shown; findings below may reference images not displayed]

FINDINGS: The cardiac silhouette is moderately enlarged and unchanged.
Mediastinal silhouette is nonsuspicious. Stable mild bibasilar
strandy densities, unchanged. No pleural effusion or focal
consolidation. No pneumothorax. Single lumen LEFT chest Port-A-Cath
with distal tip projecting cavoatrial junction. No pneumothorax.
Soft tissue planes included osseous structures are nonsuspicious.
Surgical clips in the included right abdomen compatible with
cholecystectomy.
IMPRESSION: Stable cardiomegaly.  No acute pulmonary process.

## 2018-09-15 IMAGING — DX DG CHEST 2V
2 series · 2 of 2 positions shown · non-contrast
Comparison: 09/26/2015

CLINICAL DATA: Tachypnea and hypoxia

EXAM:
CHEST  2 VIEW

[chest pa]
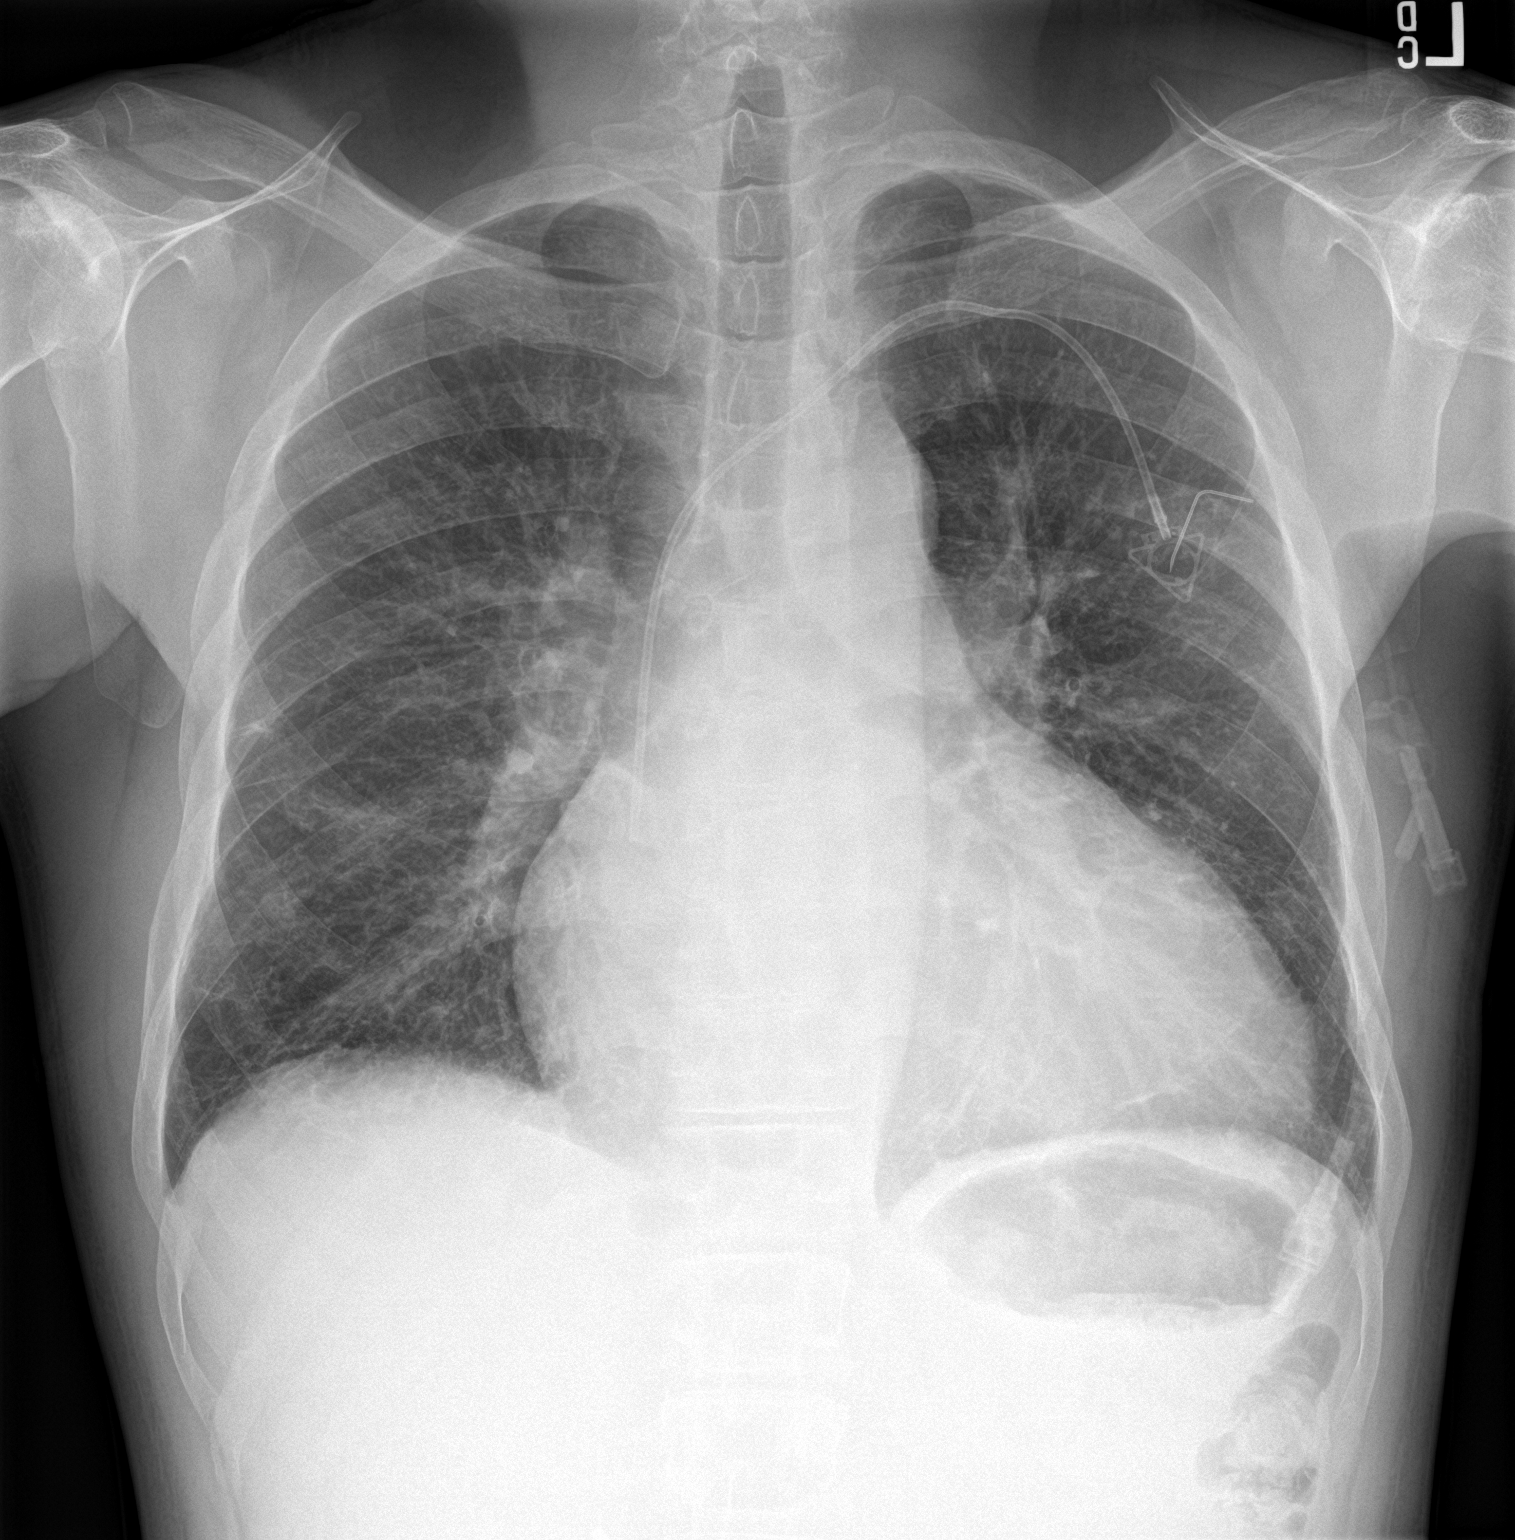

[chest lat]
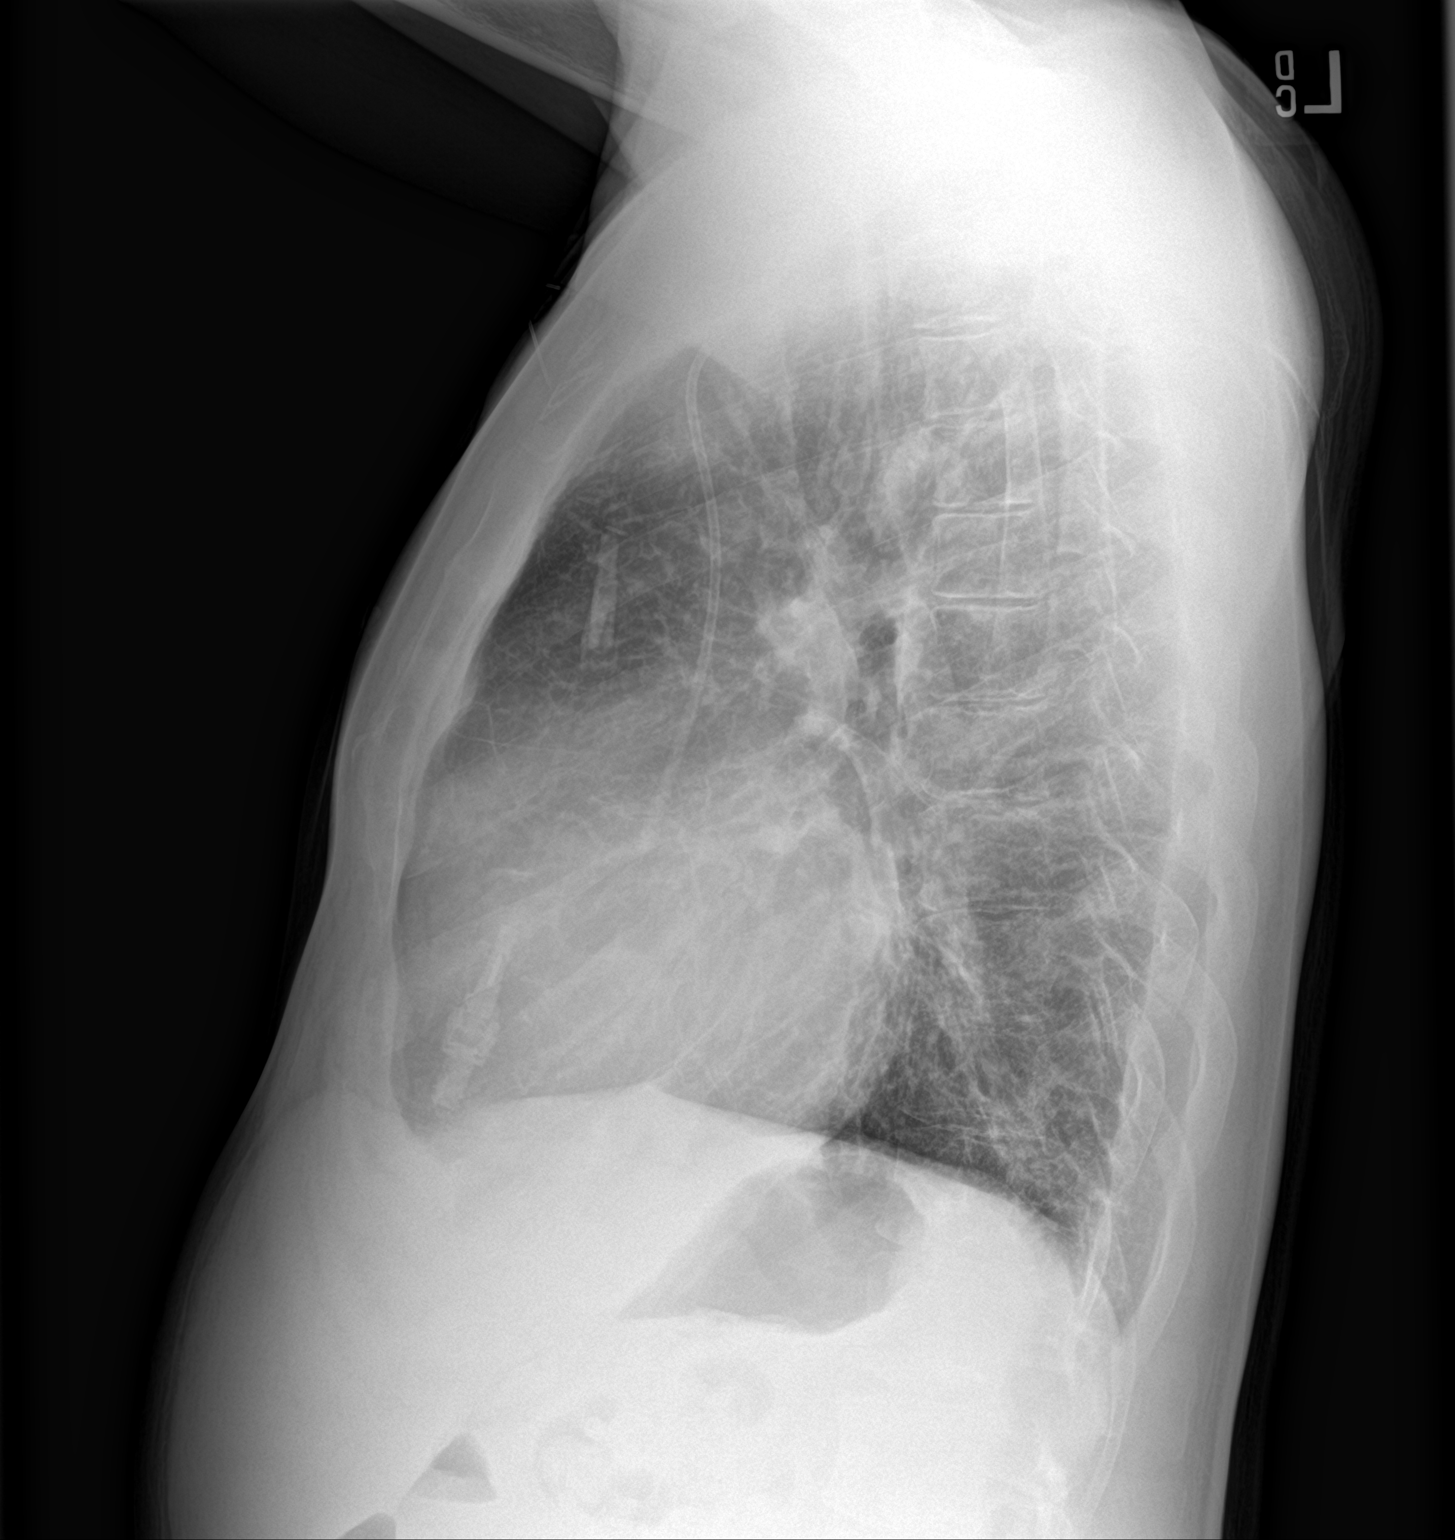

[2 of 2 positions shown; findings below may reference images not displayed]

FINDINGS: Chronic cardiomegaly. Porta catheter on the left with tip at the
upper right atrium. Chronic interstitial coarsening with small
scattered scar-like opacities. There is no edema, consolidation,
effusion, or pneumothorax. Bilateral humeral head avascular
necrosis.
IMPRESSION: Stable from prior.  No evidence of acute disease.

## 2018-09-19 IMAGING — CR DG CHEST 2V
2 series · 2 of 2 positions shown · non-contrast
Comparison: 10/23/2015

CLINICAL DATA: Sickle cell pain. New mid to left lateral chest pain
this morning.

EXAM:
CHEST  2 VIEW

[w chest pa]
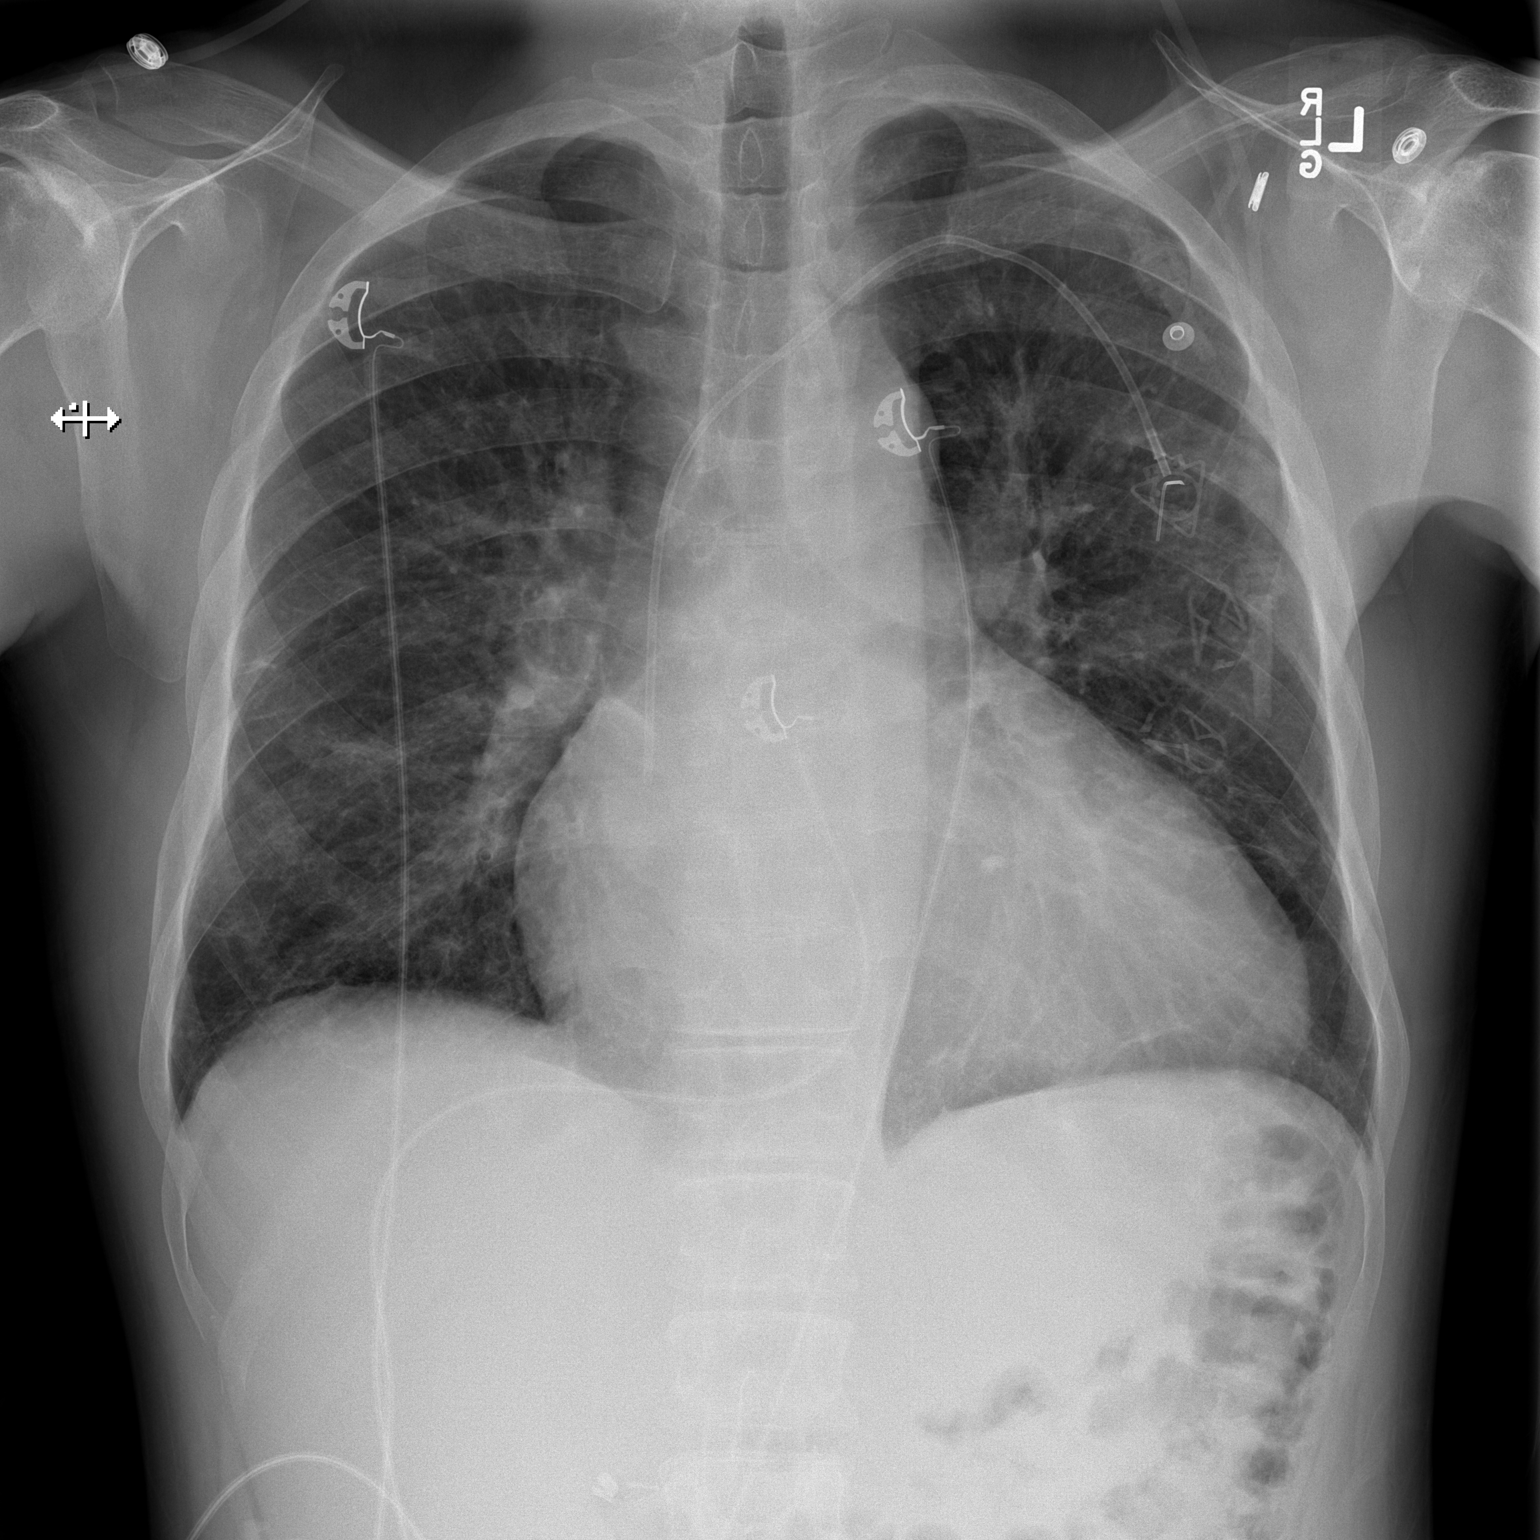

[w chest lat]
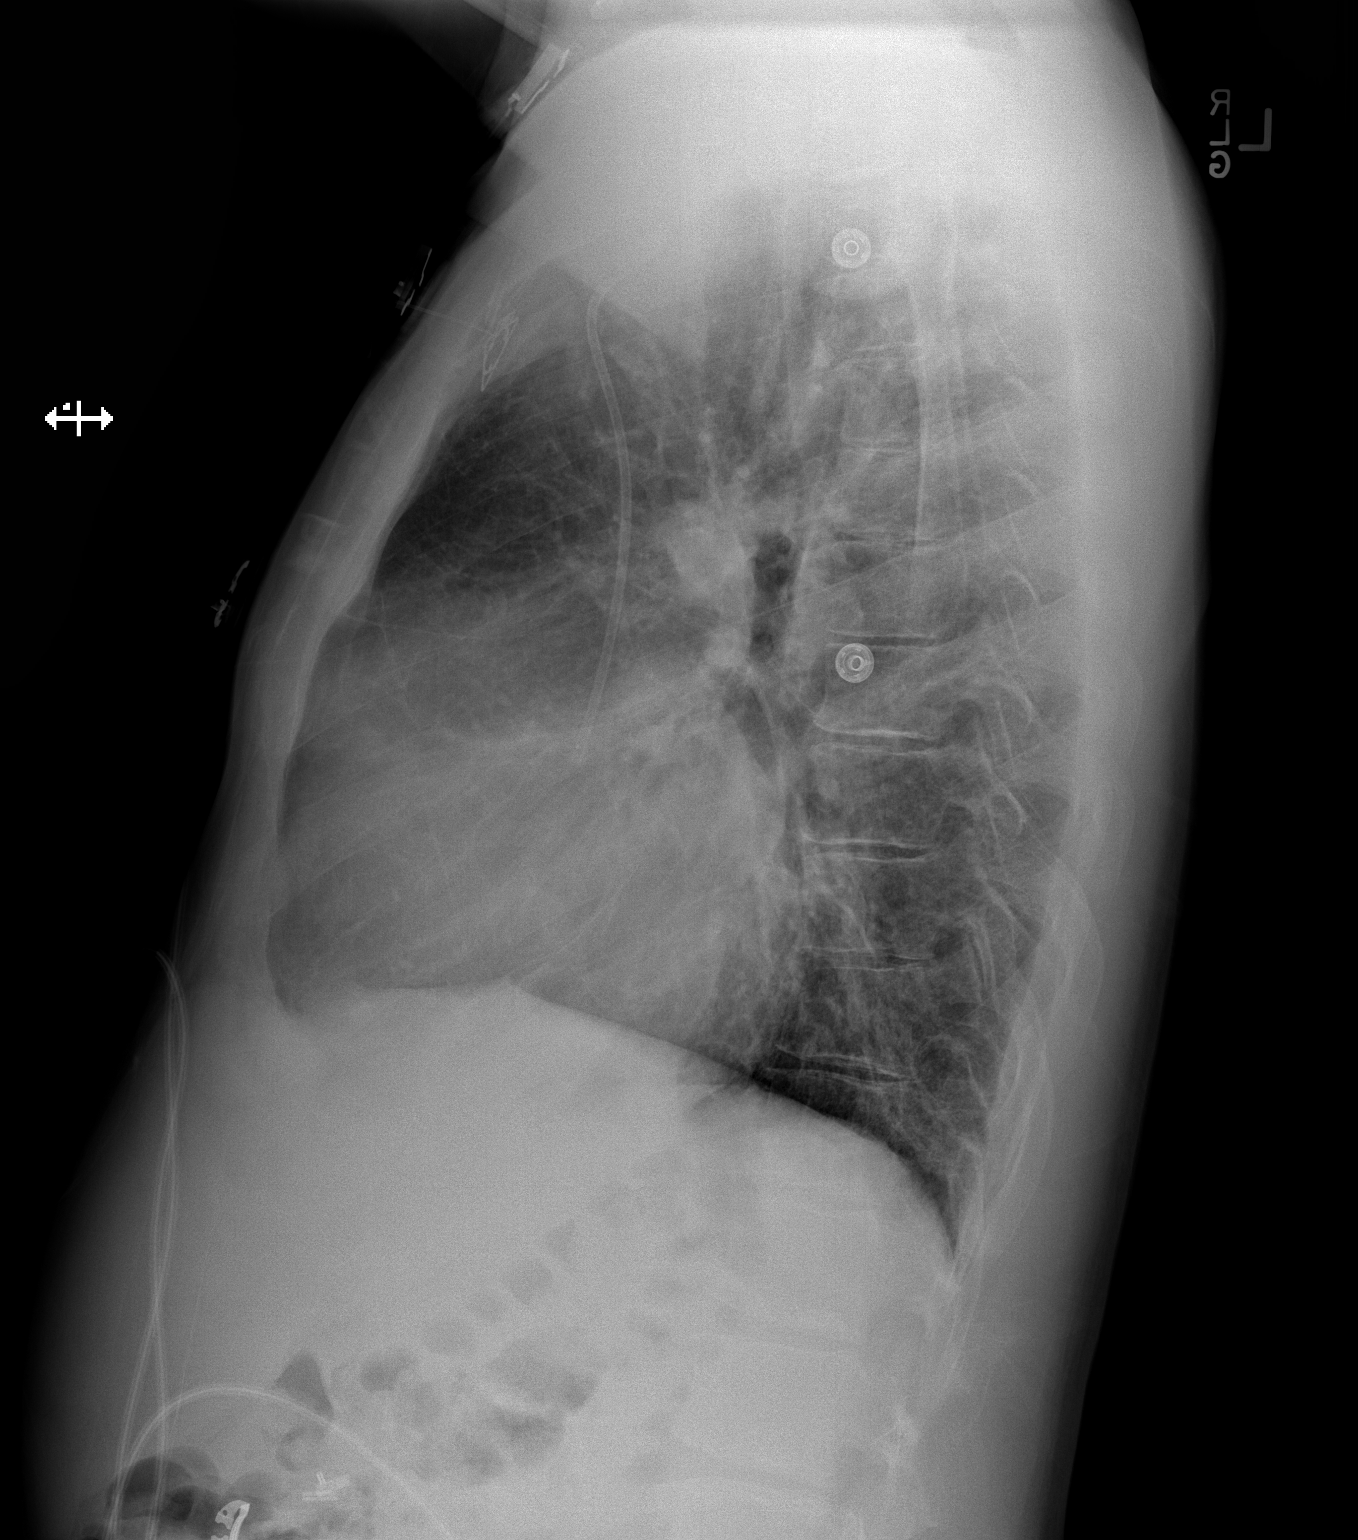

[2 of 2 positions shown; findings below may reference images not displayed]

FINDINGS: The cardiac silhouette is enlarged but stable in appearance.
Left-sided port catheter tip terminates in the proximal right
atrium, unchanged. No pneumonic consolidation nor overt pulmonary
edema. Minimal subpleural scarring in the right upper lobe. No
effusion or pneumothorax. No suspicious osseous abnormalities. There
is osteoarthritic sclerosis about the left glenohumeral joint. Right
upper quadrant surgical clips consistent with cholecystectomy
IMPRESSION: Stable cardiomegaly without acute pulmonary disease.

## 2018-09-20 IMAGING — DX DG CHEST 2V
2 series · 2 of 2 positions shown · non-contrast
Comparison: PA and lateral chest x-ray October 27, 2015

CLINICAL DATA: Sickle cell crisis, bilateral flank pain, former
smoker, history of hypertension.

EXAM:
CHEST  2 VIEW

[chest pa]
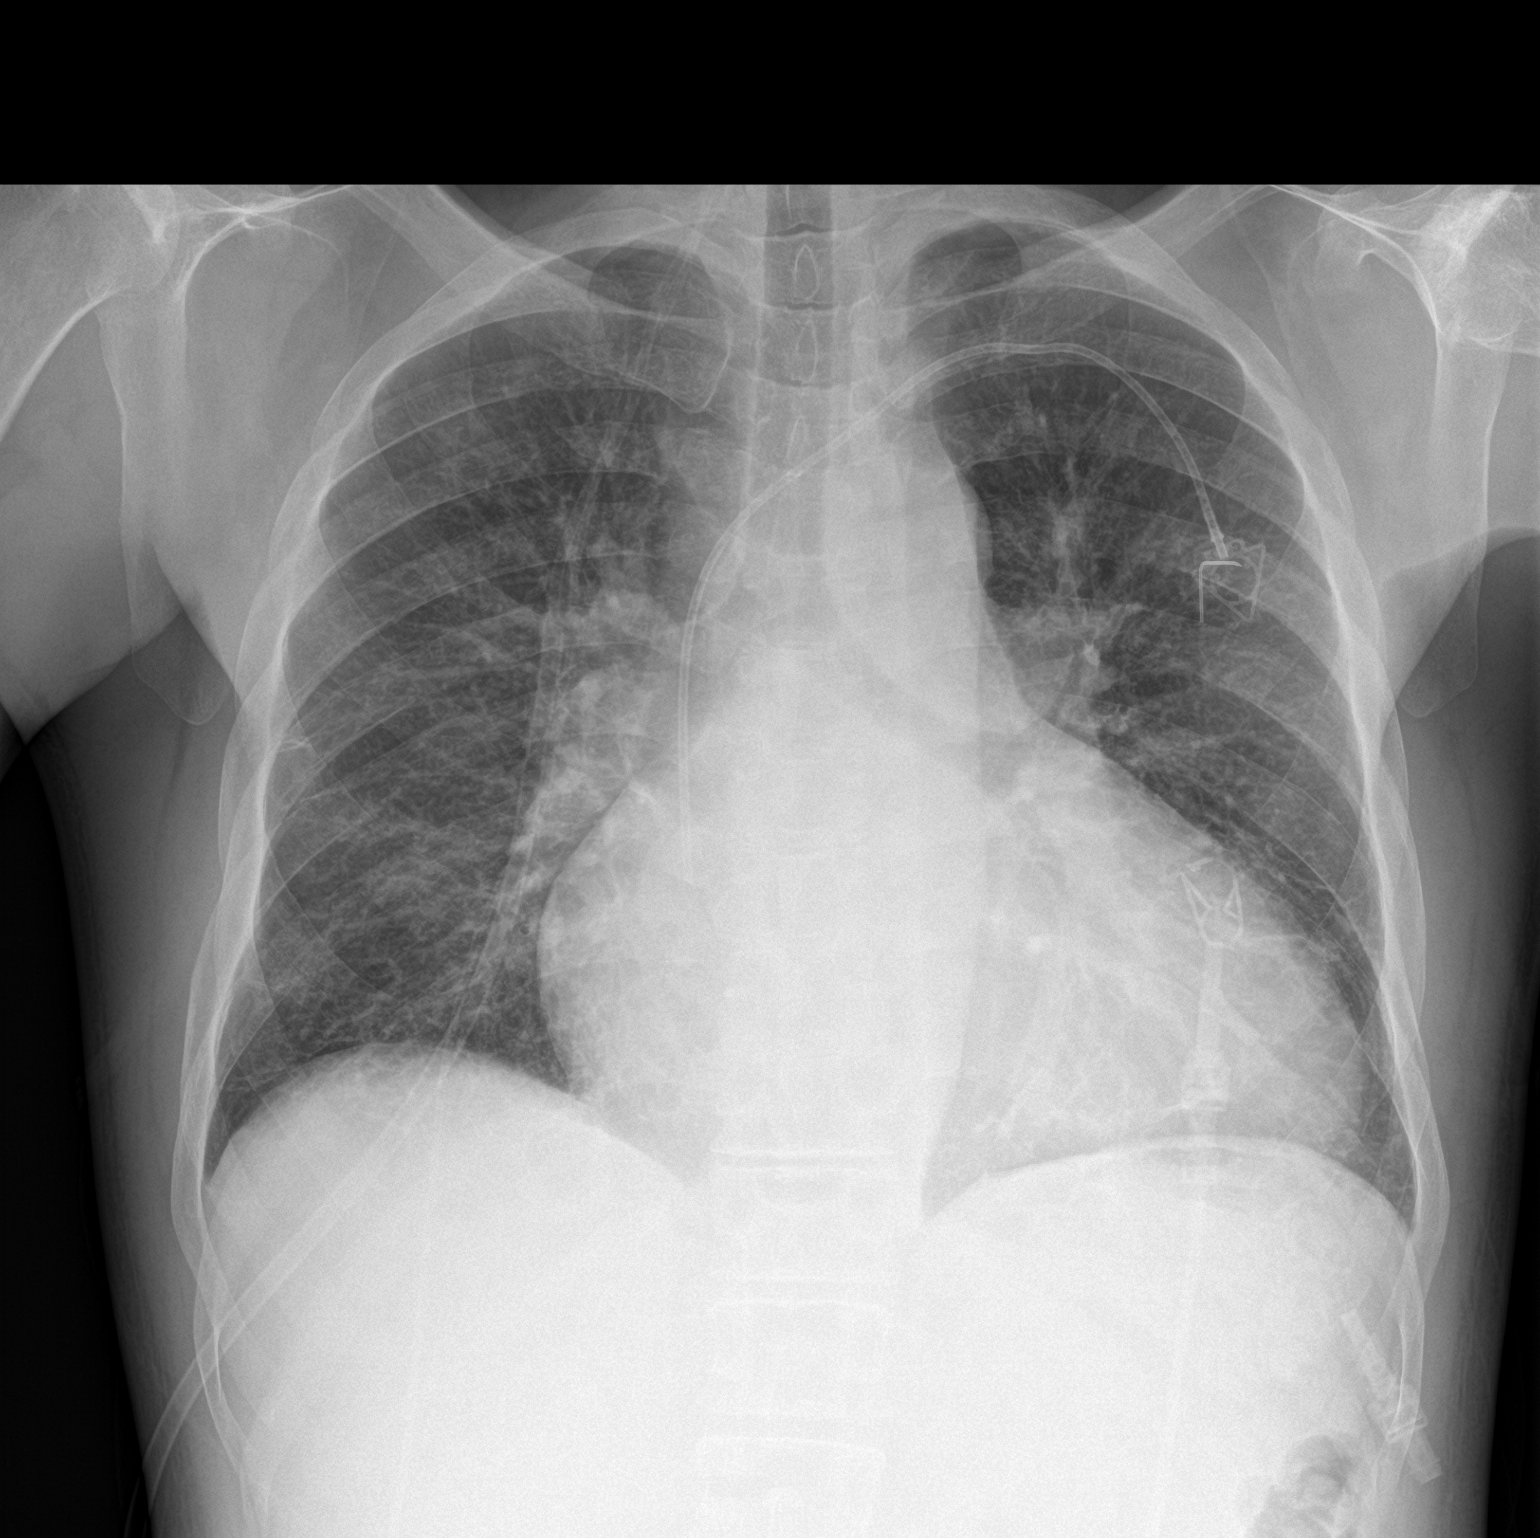

[chest lat]
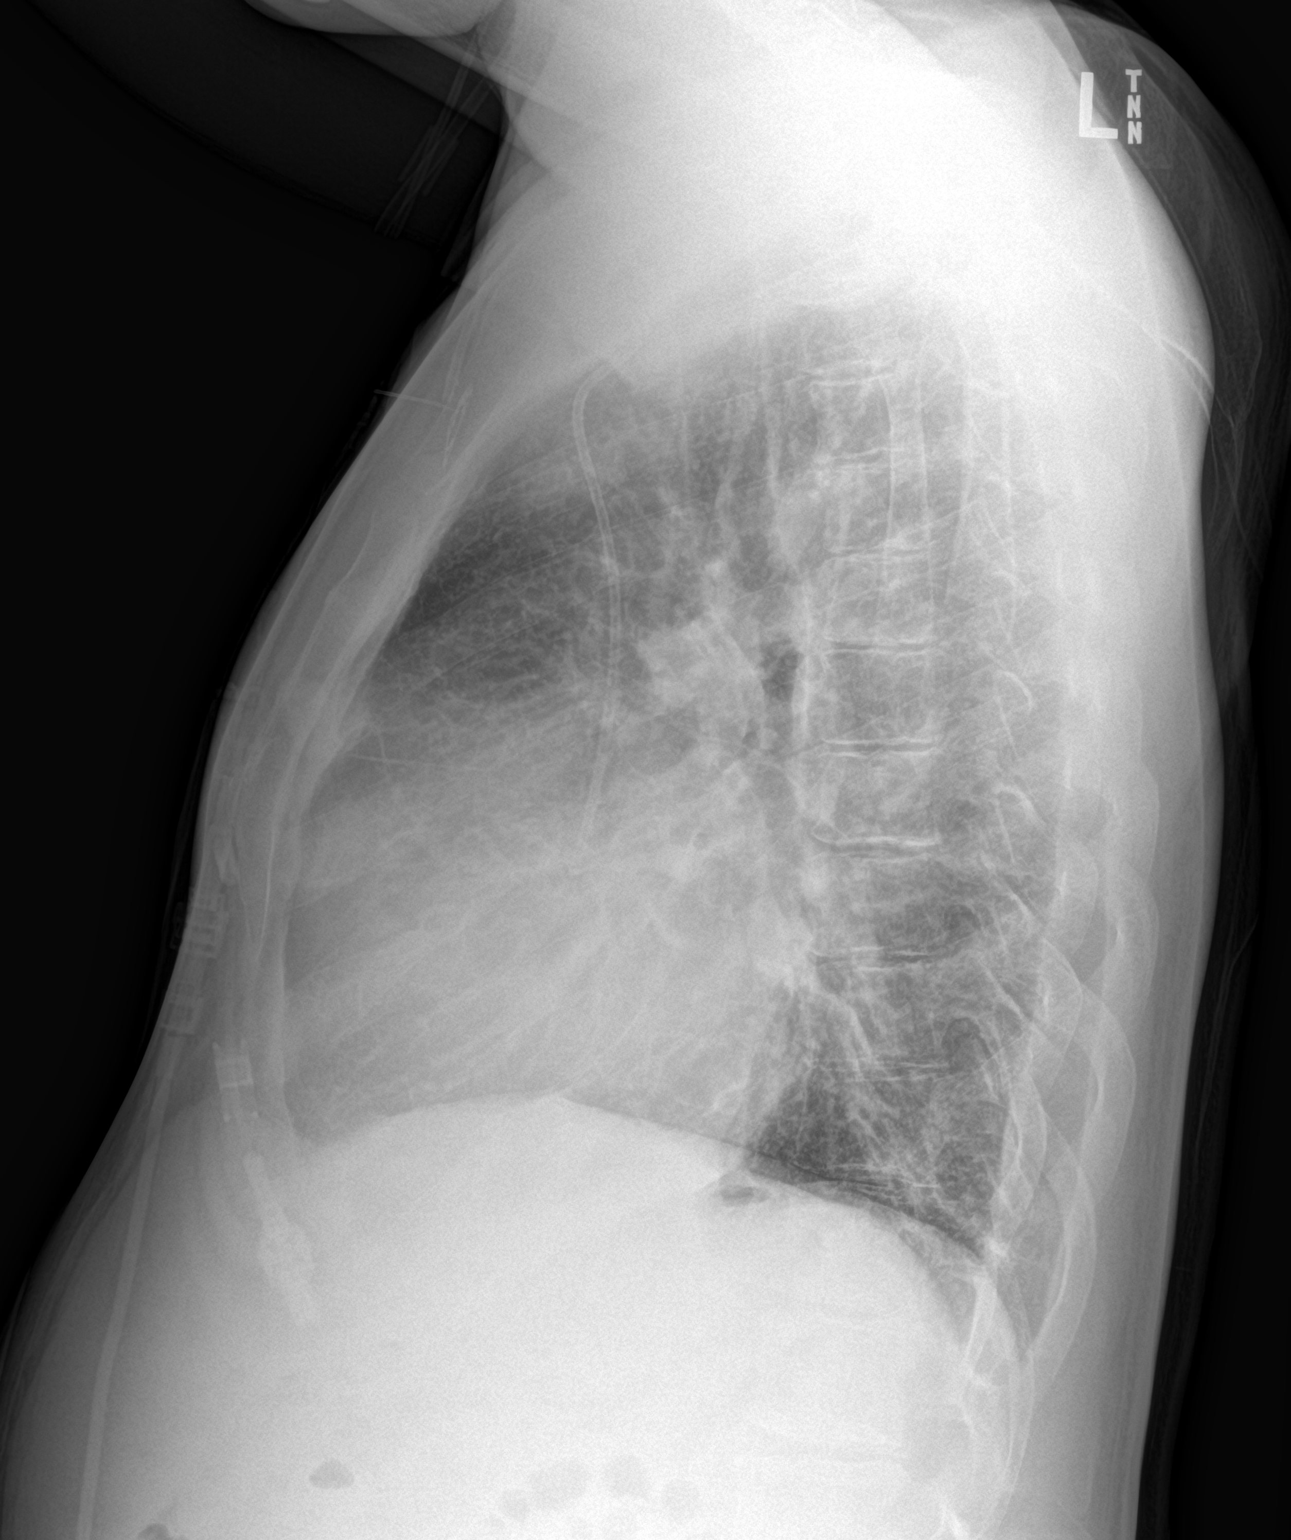

[2 of 2 positions shown; findings below may reference images not displayed]

FINDINGS: The lungs are adequately inflated. The interstitial markings remain
increased. The pulmonary vascularity remains engorged and the
cardiac silhouette remains enlarged. There is no alveolar pneumonia
nor pleural effusion or pneumothorax. The power port catheter tip
projects over the distal third of the SVC. There is multilevel
degenerative disc disease of the thoracic spine.
IMPRESSION: Stable cardiomegaly with mild pulmonary vascular prominence. No
pneumonia nor other acute cardiopulmonary abnormality.

## 2018-10-03 IMAGING — CT CT HEAD W/O CM
3 of 5 series · 17 of 47 positions shown, 20 images · non-contrast
Comparison: None.

CLINICAL DATA: Left-sided weakness.

EXAM:
CT HEAD WITHOUT CONTRAST
TECHNIQUE: Contiguous axial images were obtained from the base of the skull
through the vertex without intravenous contrast.

[Series 201: head w/o, idose (1) · axial · non-contrast · 0.49mm/px · z∈[+75,+205]mm · 11 of 32 slices shown, 14 images]
[im 3/32  brain]
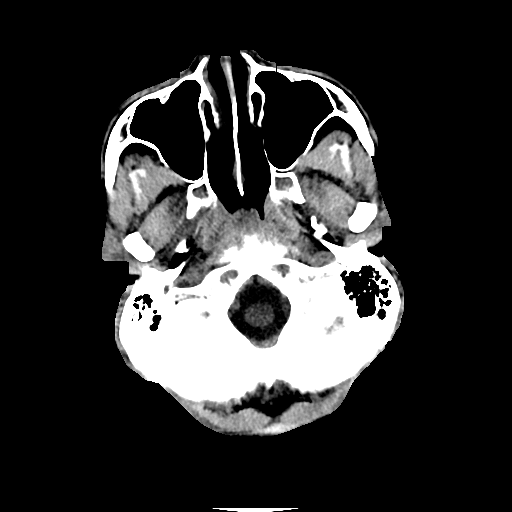
[im 3/32  bone]
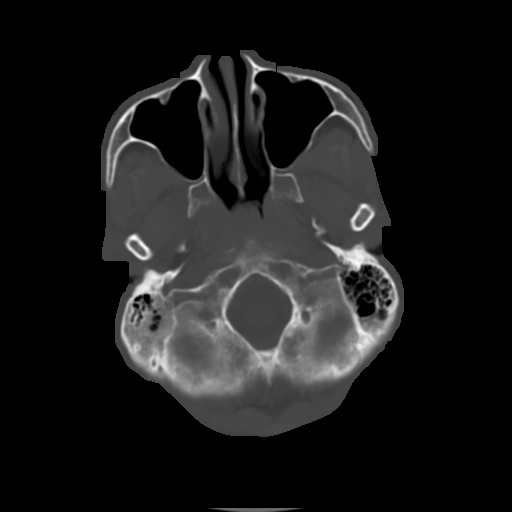
[im 5/32  brain]
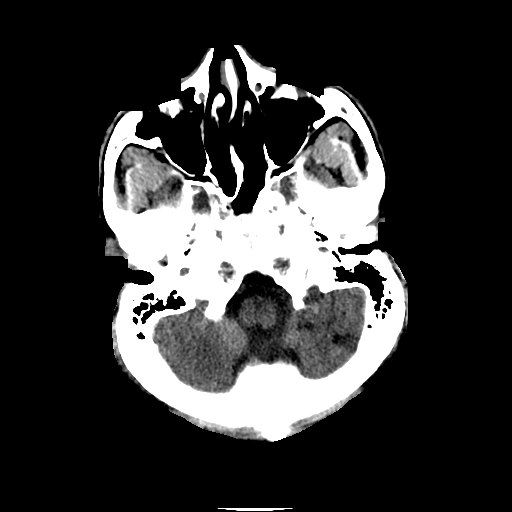
[im 7/32  brain]
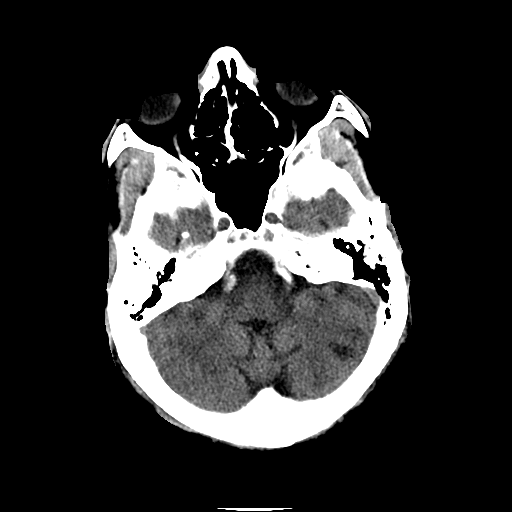
[im 12/32  brain]
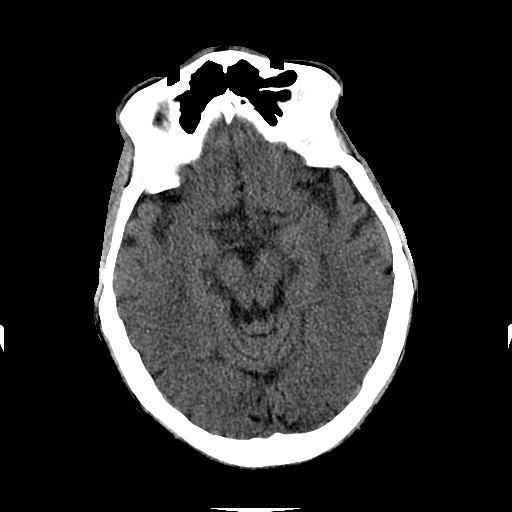
[im 14/32  brain]
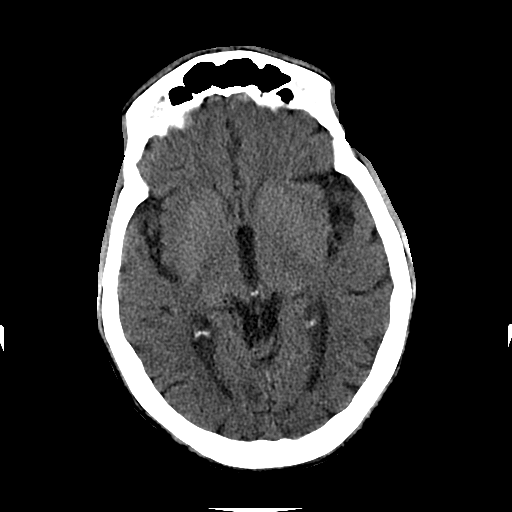
[im 14/32  bone]
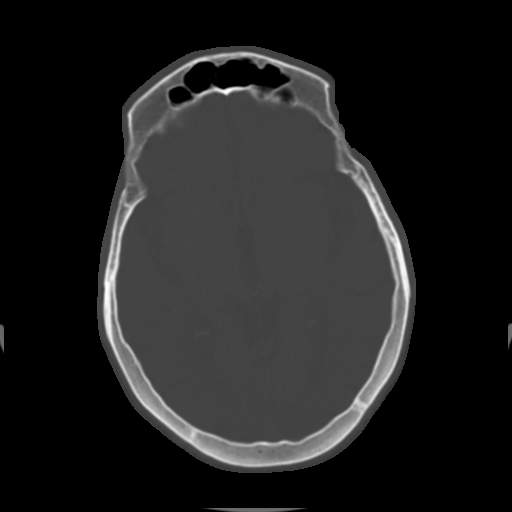
[im 16/32  brain]
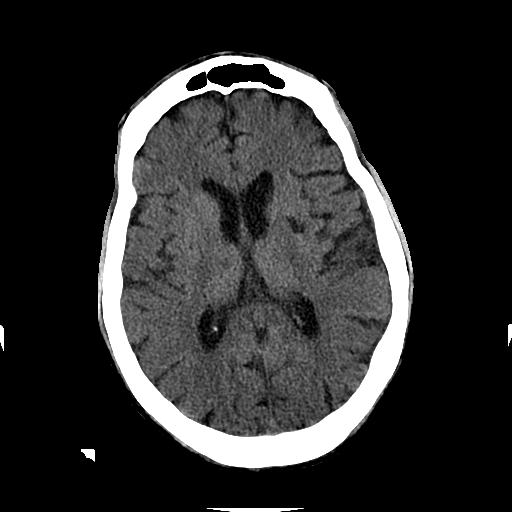
[im 18/32  brain]
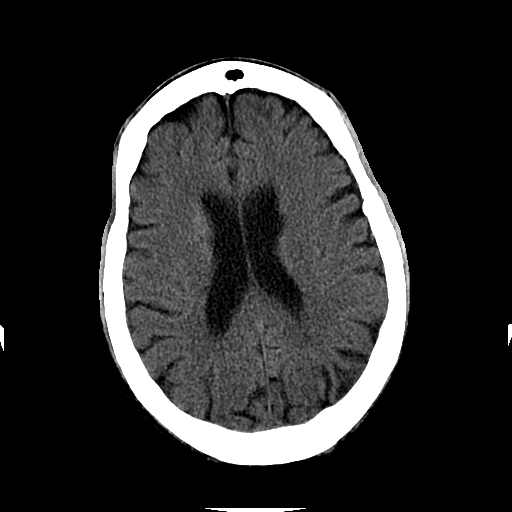
[im 20/32  brain]
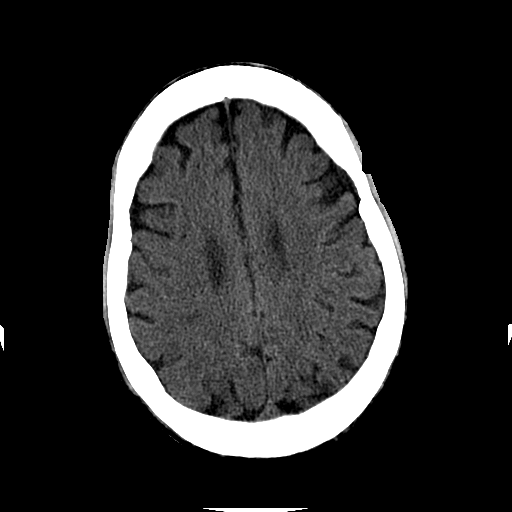
[im 25/32  brain]
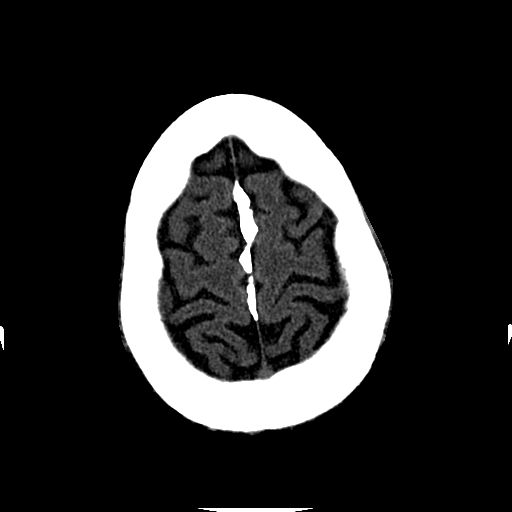
[im 25/32  bone]
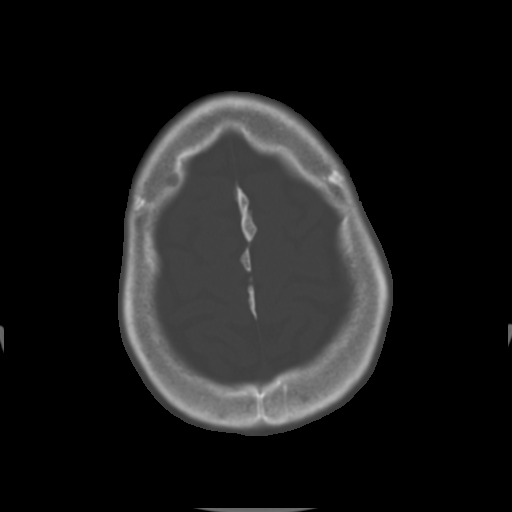
[im 27/32  brain]
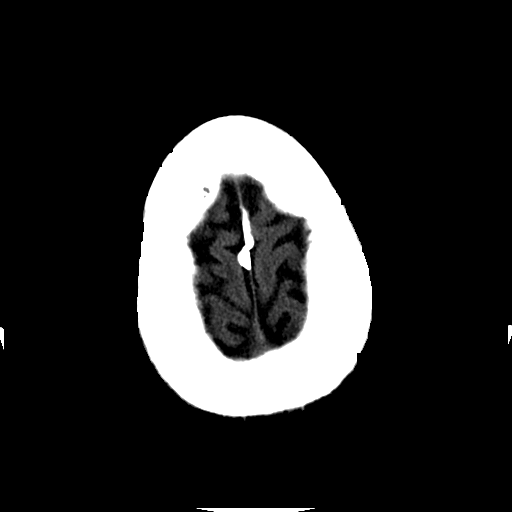
[im 29/32  brain]
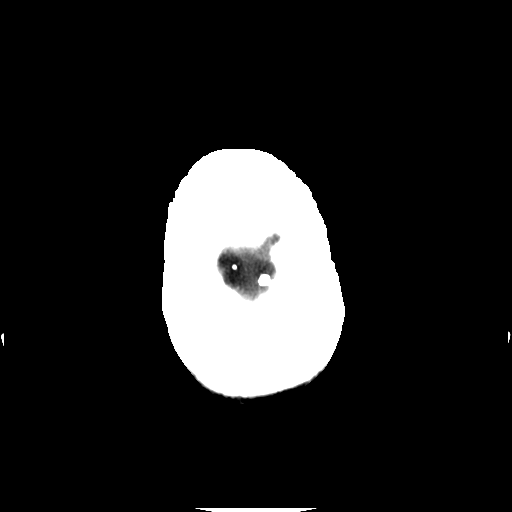

[Series 203: coronal st, idose (1) · coronal · 0.40mm/px · 3 of 81 slices shown]
[im 21/81  brain]
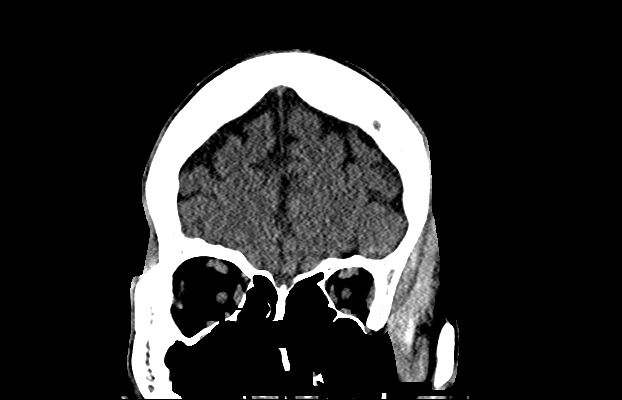
[im 41/81  brain]
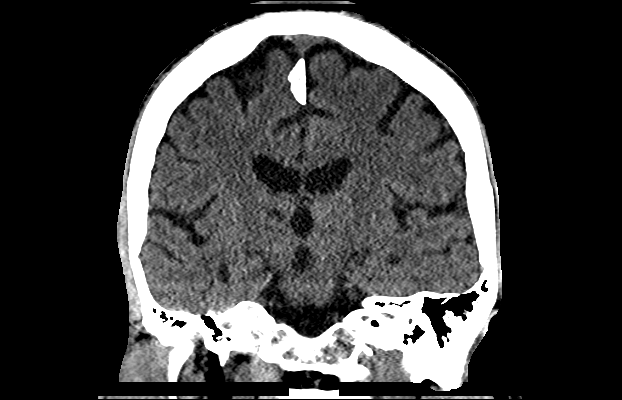
[im 61/81  brain]
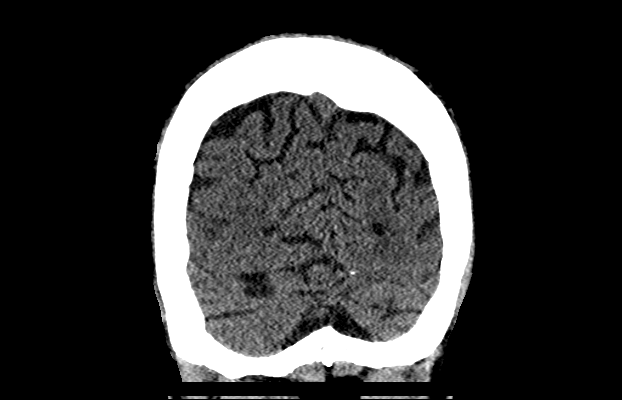

[Series 204: sagittal st, idose (1) · sagittal · 0.40mm/px · 3 of 83 slices shown]
[im 28/83  brain]
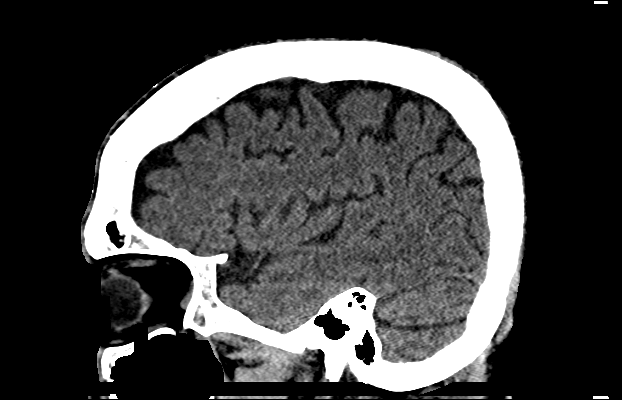
[im 42/83  brain]
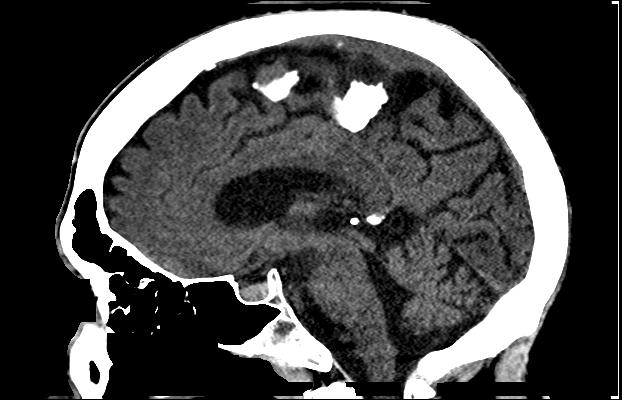
[im 55/83  brain]
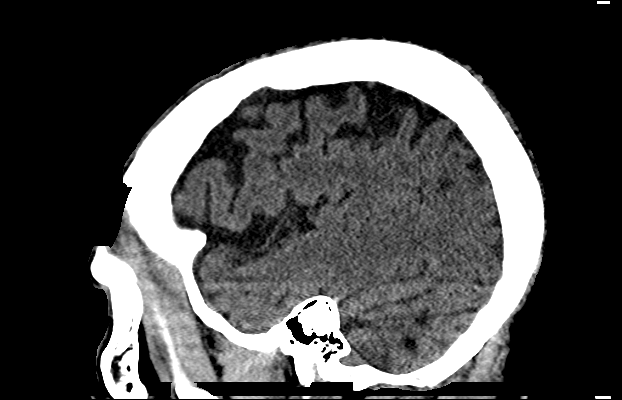

[17 of 47 positions shown; findings below may reference images not displayed]

FINDINGS: Brain: No evidence of acute infarction, hemorrhage, extra-axial
collection, ventriculomegaly, or mass effect. Old left basal ganglia
lacunar infarct. Old bilateral cerebellar infarcts. Generalized
cerebral atrophy. Periventricular white matter low attenuation
likely secondary to microangiopathy.

Vascular: No significant cerebral vascular atherosclerotic
calcifications are noted.

Skull: Negative for fracture or focal lesion.

Sinuses/Orbits: Visualized portions of the orbits are unremarkable.
Visualized portions of the paranasal sinuses and mastoid air cells
are unremarkable.

Other: None.
IMPRESSION: 1. No acute intracranial pathology.

## 2018-10-03 IMAGING — DX DG CHEST 1V PORT
1 series · 1 of 1 positions shown · non-contrast
Comparison: 10/28/2015; 09/26/2015; 08/07/2015

CLINICAL DATA: History of sickle cell disease, now with shortness
of breath and hypoxia.

EXAM:
PORTABLE CHEST 1 VIEW

[chest ap]
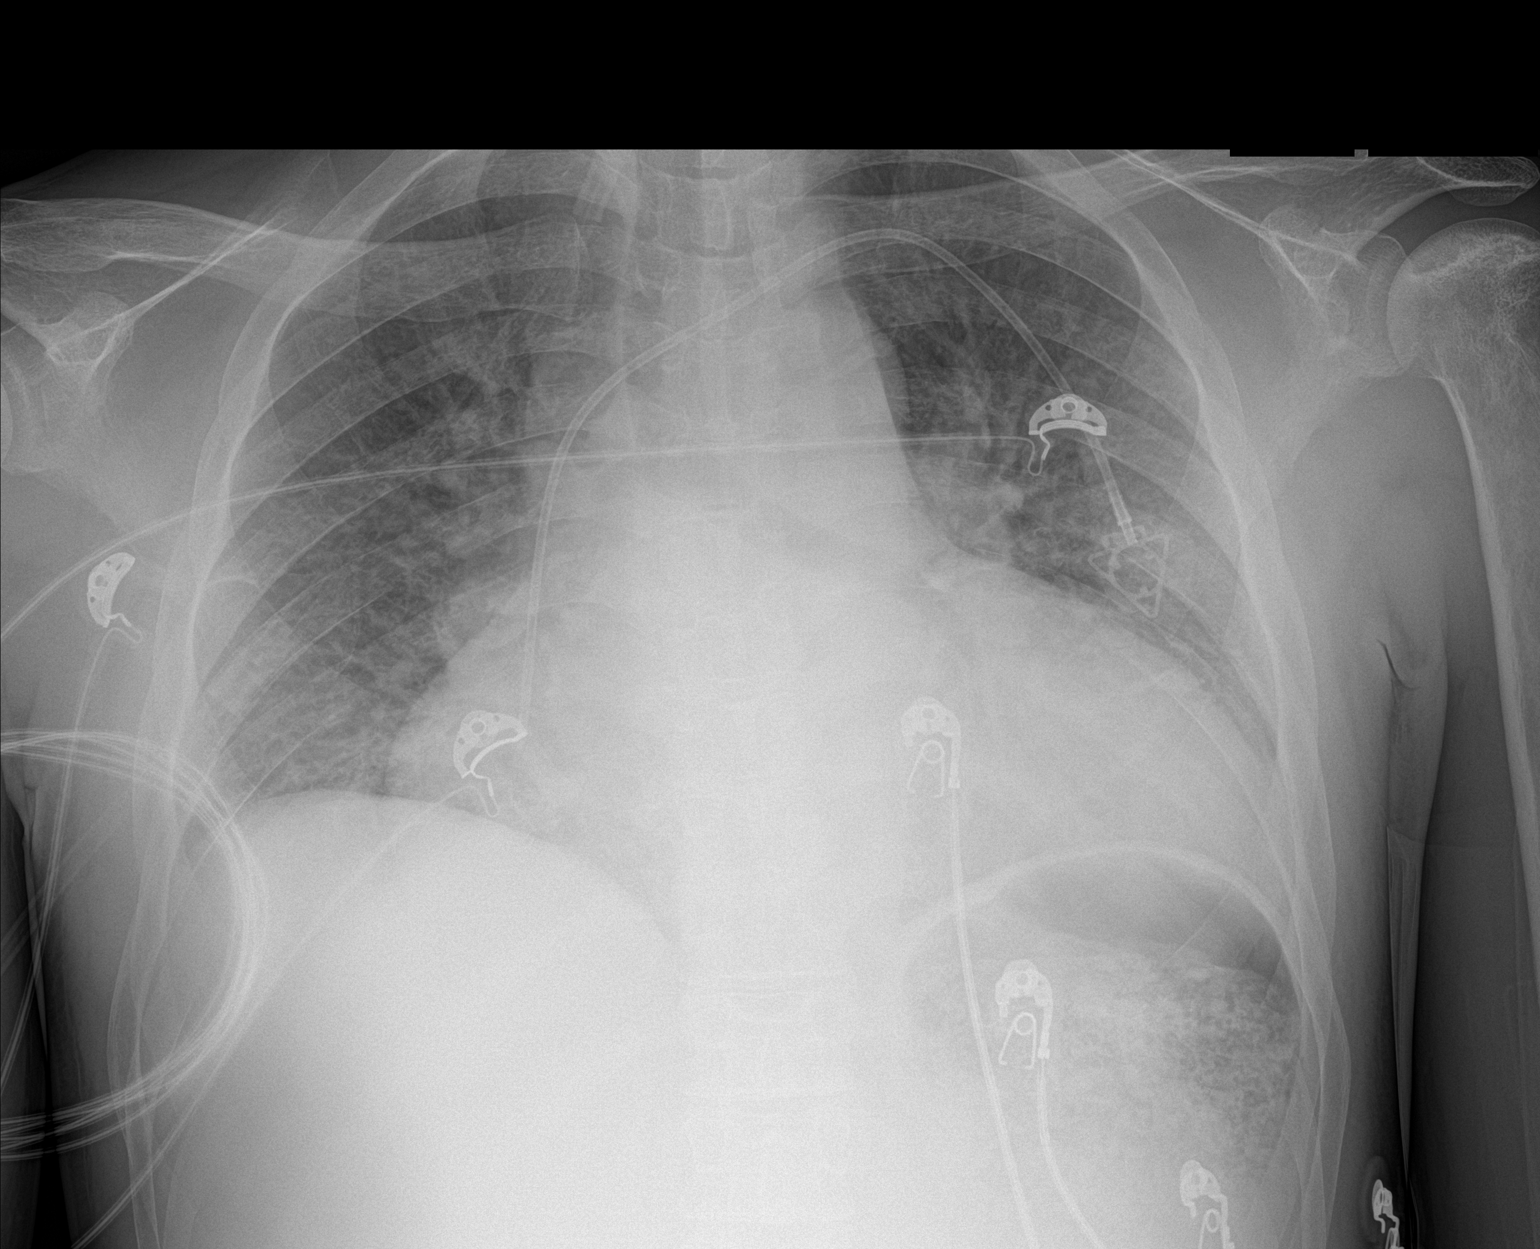

[1 of 1 positions shown; findings below may reference images not displayed]

FINDINGS: Similar findings of enlarged cardiac silhouette and mediastinal
contours given reduced lung volumes. Pulmonary vasculature is
indistinct with cephalization of flow. Grossly unchanged bilateral
infrahilar heterogeneous opacities favored to represent atelectasis.
Stable position of support apparatus. Trace pleural effusions are
not excluded. No pneumothorax. Stable sequela of avascular necrosis
involving the left humeral head, incompletely evaluated.
IMPRESSION: 1. Findings worrisome for pulmonary edema on this AP portable
examination. Note, atypical infection could have a similar
appearance. Further evaluation with a PA and lateral chest
radiograph may be obtained as clinically indicated.
2. Similar findings of cardiomegaly.

## 2018-10-03 IMAGING — MR MR HEAD W/O CM
9 of 10 series · 35 of 48 positions shown · non-contrast
Comparison: Head CT same day.

CLINICAL DATA: Sickle cell disease. Gait disturbance and left
facial droop.

EXAM:
MRI HEAD WITHOUT CONTRAST
TECHNIQUE: Multiplanar, multiecho pulse sequences of the brain and surrounding
structures were obtained without intravenous contrast.

[Series 3: T1 · sagittal · 5.0mm · 0.47mm/px · 1 of 23 slices shown]
[im 1/23]
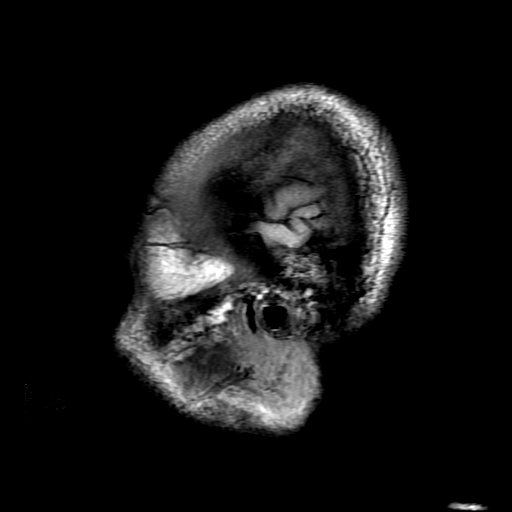

[Series 4: DWI · axial · 3.0mm · 1.09mm/px · z∈[-47,+89]mm · 9 of 94 slices shown (1 of 4)]
[im 1/94]
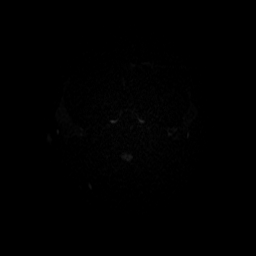
[im 12/94]
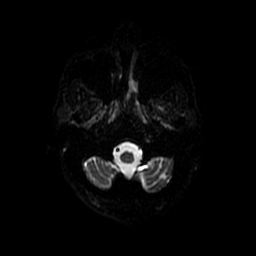
[im 24/94]
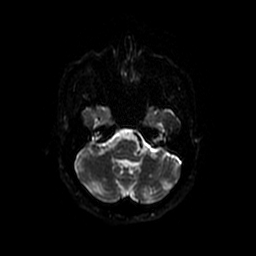
[im 35/94]
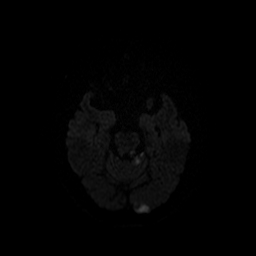
[im 47/94]
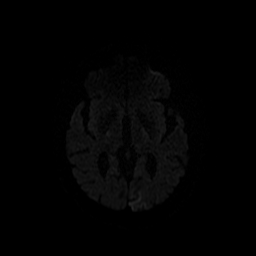
[im 59/94]
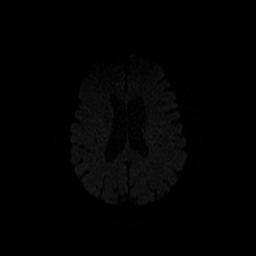
[im 70/94]
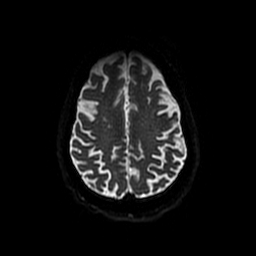
[im 82/94]
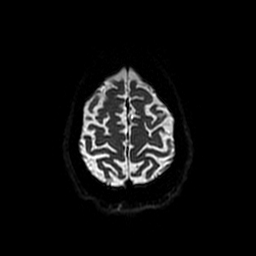
[im 94/94]
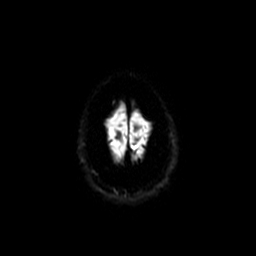

[Series 5: DWI · coronal · 5.0mm · 1.09mm/px · 7 of 66 slices shown (2 of 4)]
[im 1/66]
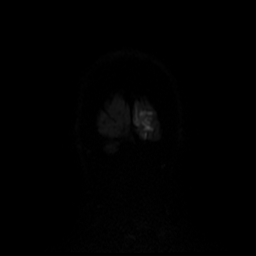
[im 11/66]
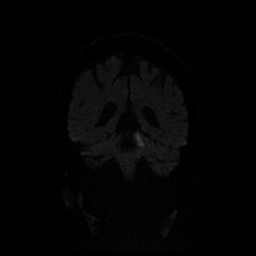
[im 22/66]
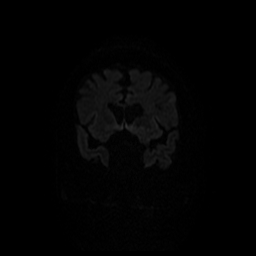
[im 33/66]
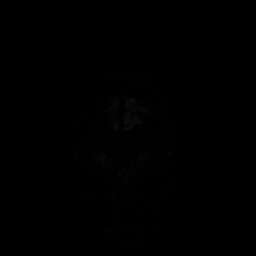
[im 44/66]
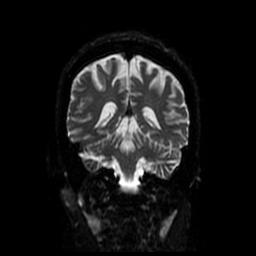
[im 55/66]
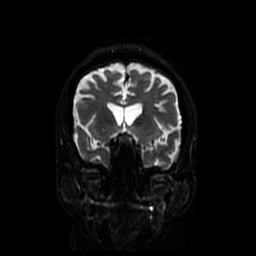
[im 66/66]
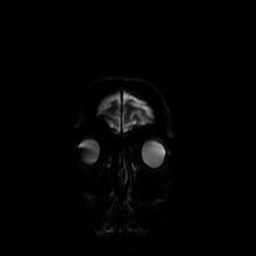

[Series 7: ax mpgr · axial · 5.0mm · 0.47mm/px · 1 of 26 slices shown]
[im 1/26]
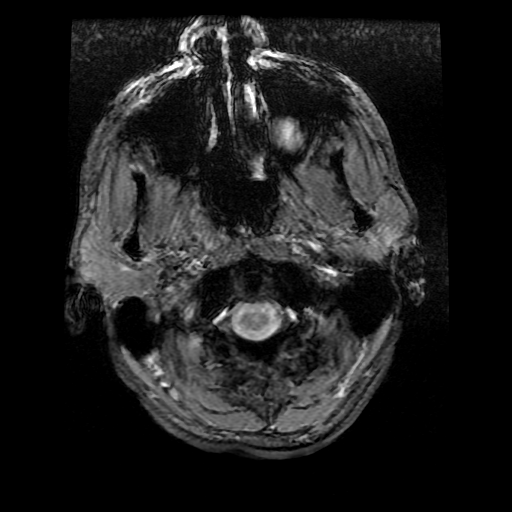

[Series 8: T2 · axial · 5.0mm · 0.47mm/px · z∈[-43,+104]mm · 3 of 26 slices shown (1 of 2)]
[im 1/26]
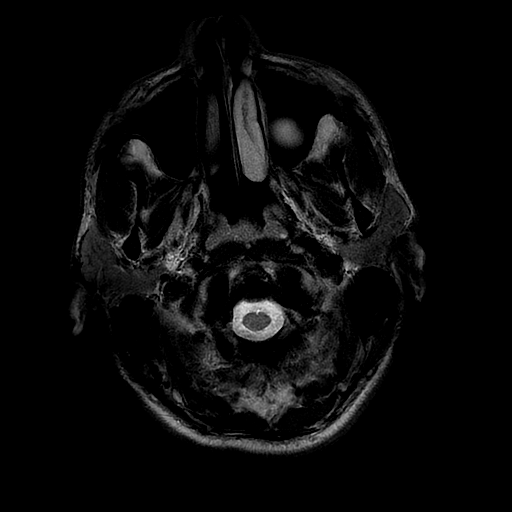
[im 13/26]
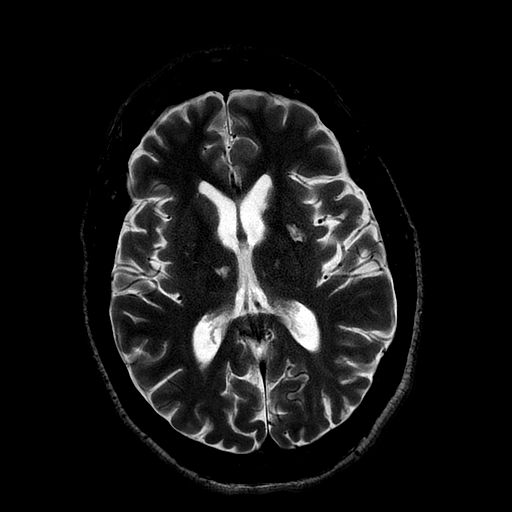
[im 26/26]
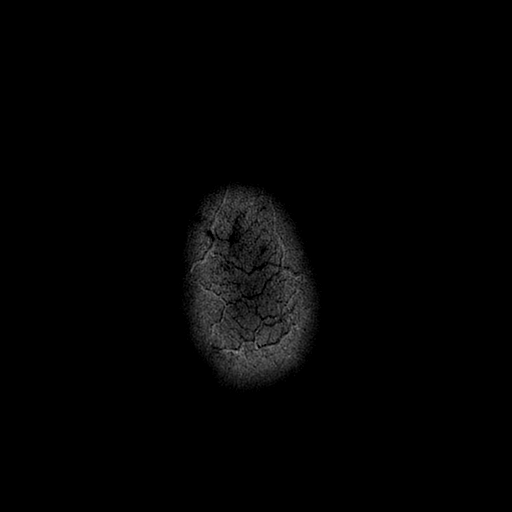

[Series 9: FLAIR · axial · 5.0mm · 0.47mm/px · z∈[-43,+104]mm · 3 of 26 slices shown]
[im 1/26]
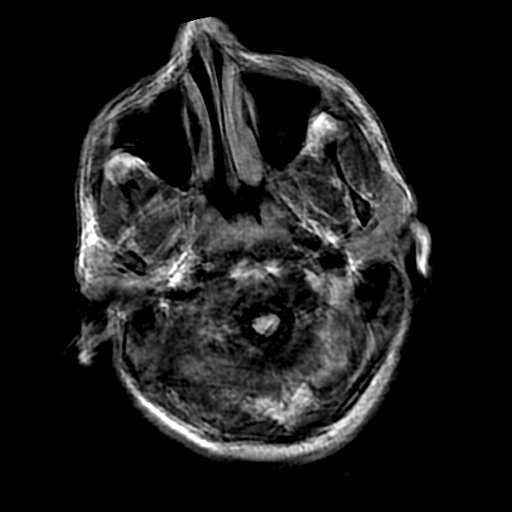
[im 13/26]
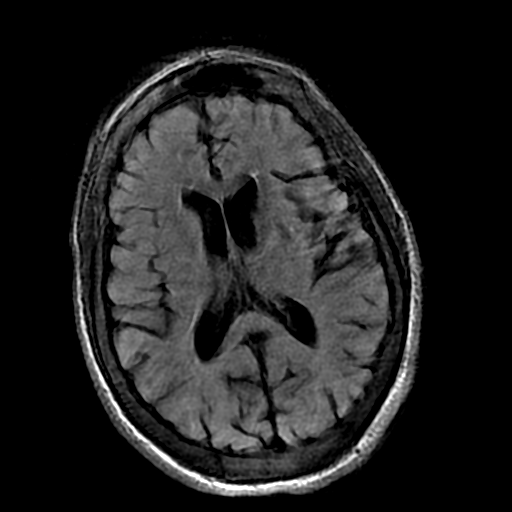
[im 26/26]
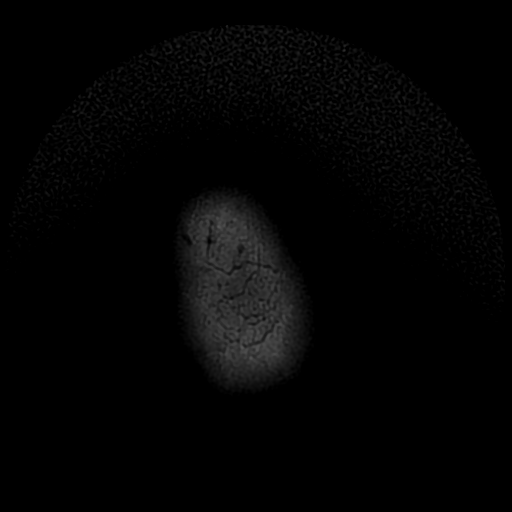

[Series 11: T2 · coronal · 5.0mm · 0.43mm/px · 3 of 28 slices shown (2 of 2)]
[im 1/28]
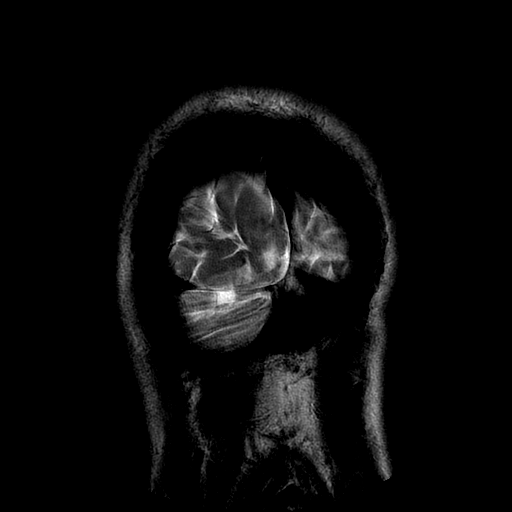
[im 14/28]
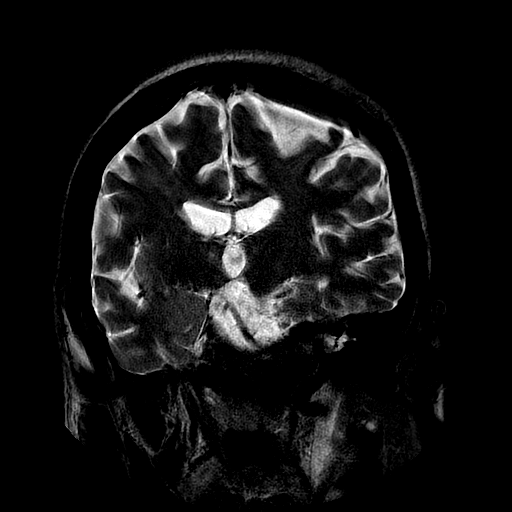
[im 28/28]
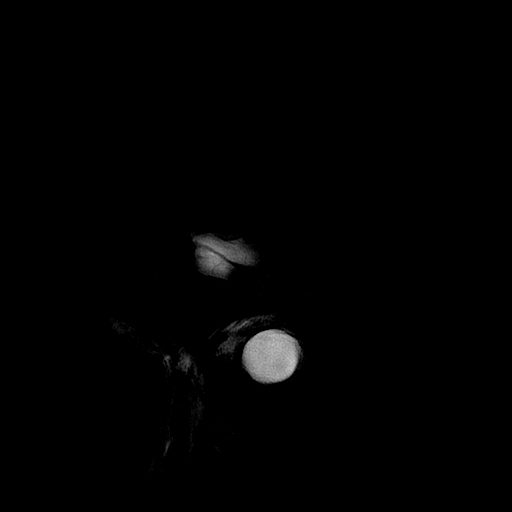

[Series 400: DWI · axial · 3.0mm · 1.09mm/px · z∈[-47,+89]mm · 5 of 47 slices shown (3 of 4)]
[im 1/47]
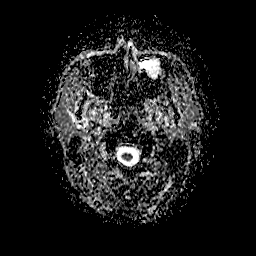
[im 12/47]
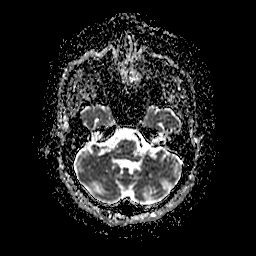
[im 24/47]
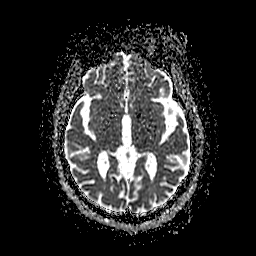
[im 35/47]
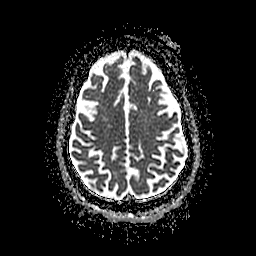
[im 47/47]
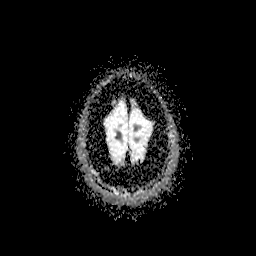

[Series 500: DWI · coronal · 5.0mm · 1.09mm/px · 3 of 33 slices shown (4 of 4)]
[im 1/33]
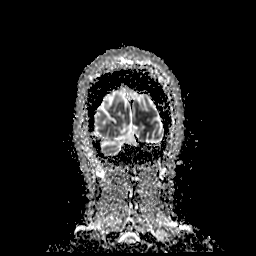
[im 17/33]
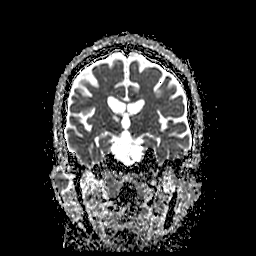
[im 33/33]
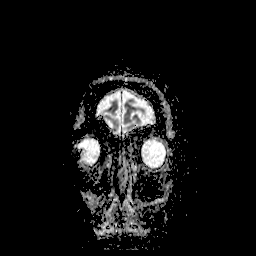

[35 of 48 positions shown; findings below may reference images not displayed]

FINDINGS: Brain: There are scattered acute infarctions. Small foci of acute
infarction are seen within the left superior cerebellum and left
posterior pons/midbrain junction. There is acute infarction within
the left occipital cortex measuring about 2 in diameter. There is a
small acute infarction within the left corpus callosum more
cingulate gyrus. A few punctate acute infarctions are seen in the
left frontoparietal cortex. No acute infarction affecting the right
hemisphere.

There are multiple old bilateral cerebellar infarctions. There are
old small vessel infarctions affecting the thalami I and basal
ganglia left more than right. No mass lesion, hemorrhage,
hydrocephalus or extra-axial collection.

Vascular: Major vessels at the base of the brain show flow.

Skull and upper cervical spine: Negative except for chronic marrow
changes of sickle cell.

Sinuses/Orbits: Chronic marrow changes of sickle cell. No
inflammatory sinus disease. Orbits negative.

Other: None significant
IMPRESSION: Background pattern of old infarctions throughout the cerebellum and
old lacunar infarctions affecting the thalami basal ganglia.
Multiple acute infarctions including areas of involvement in the
left cerebellum, left dorsal pons midbrain junction, left occipital
lobe, the left corpus callosum or cingulate gyrus, and a few small
punctate foci in the left frontoparietal cortex. Findings are most
consistent with embolic disease from the heart or ascending aorta
given the combination of anterior and posterior circulation
infarctions.

## 2018-10-15 IMAGING — DX DG CHEST 1V PORT
1 series · 1 of 1 positions shown · non-contrast
Comparison: 11/10/2015

CLINICAL DATA: Sickle-cell crisis, acute chest syndrome

EXAM:
PORTABLE CHEST 1 VIEW

[chest ap]
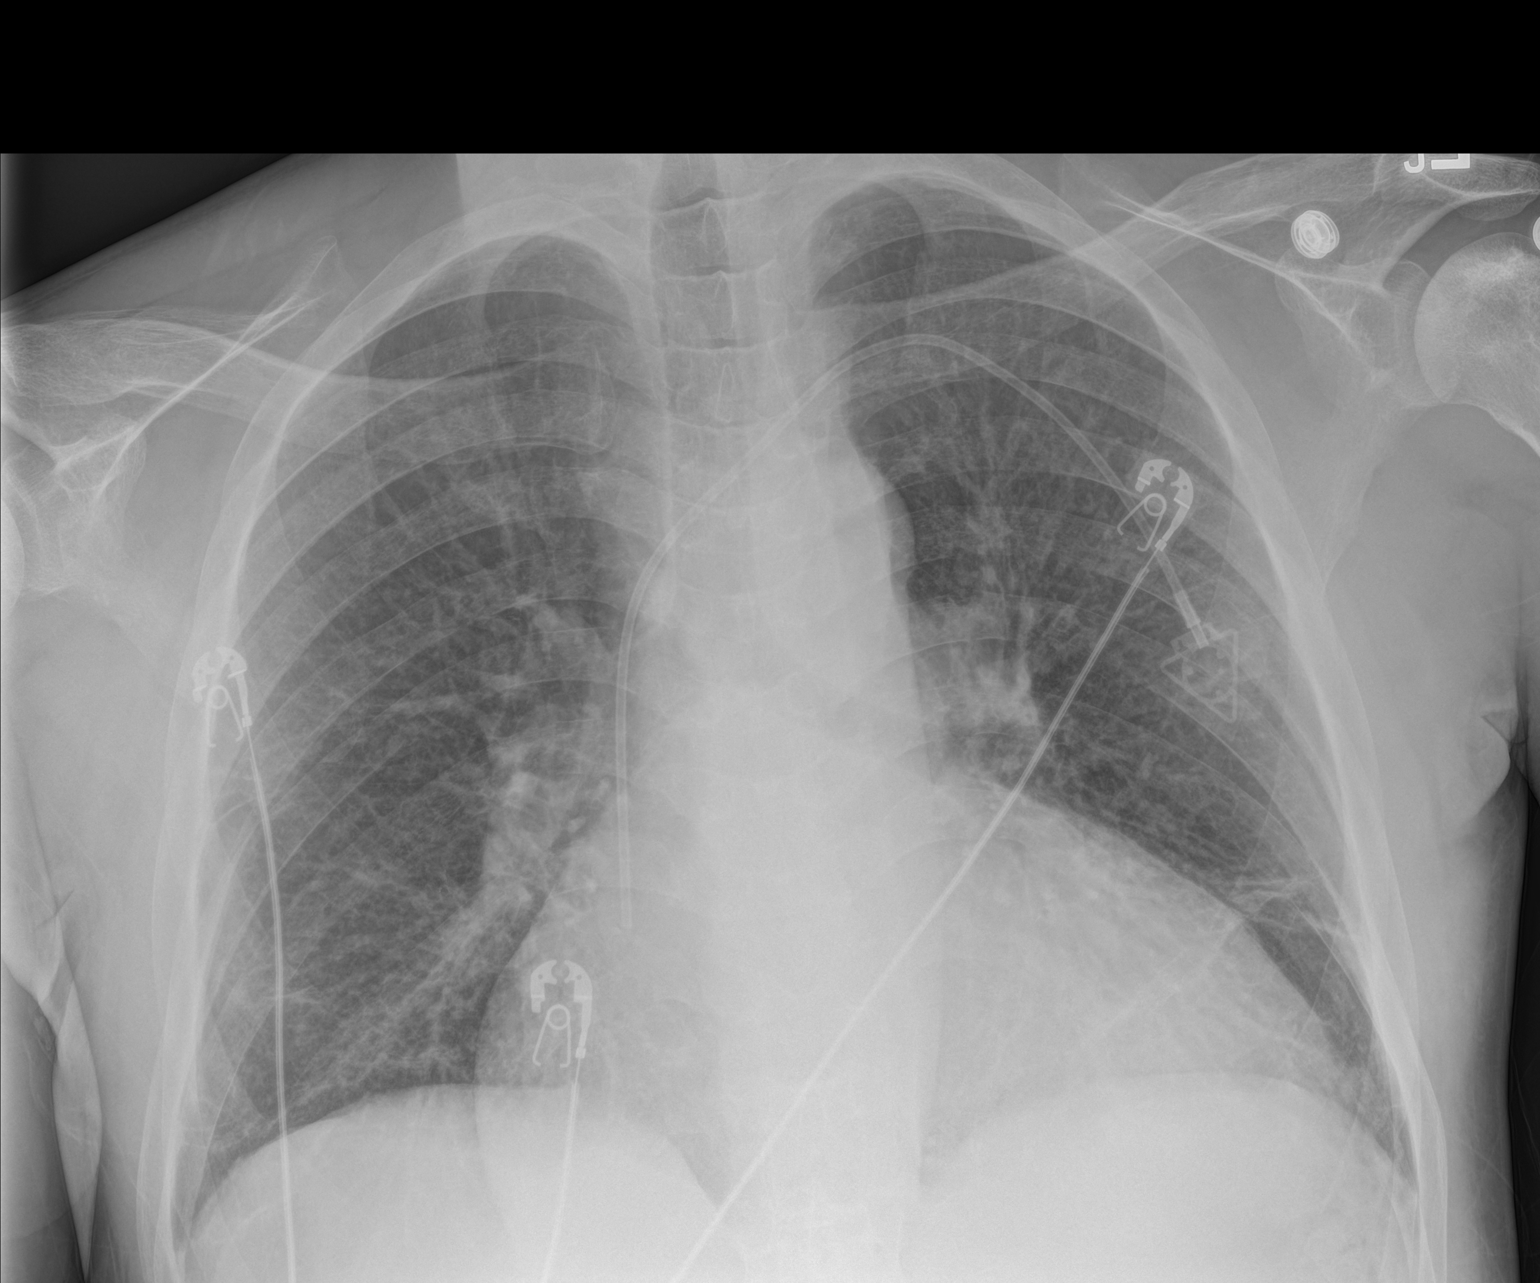

[1 of 1 positions shown; findings below may reference images not displayed]

FINDINGS: Marked cardiomegaly with mild vascular congestion and diffuse
interstitial prominence. Basilar peripheral Kerley B-lines noted
suspicious for mild basilar interstitial edema pattern. There is
associated increased bibasilar atelectasis. No large effusion or
pneumothorax. Trachea is midline. Left subclavian power port
catheter tip at the SVC RA junction.
IMPRESSION: Marked cardiomegaly with mild basilar interstitial edema pattern and
increased basilar atelectasis.

## 2018-10-16 IMAGING — CR DG CHEST 2V
2 series · 2 of 2 positions shown · non-contrast
Comparison: 11/22/2015

CLINICAL DATA: Bilateral side pain for 1 week with hypoxia.

EXAM:
CHEST  2 VIEW

[w chest pa]
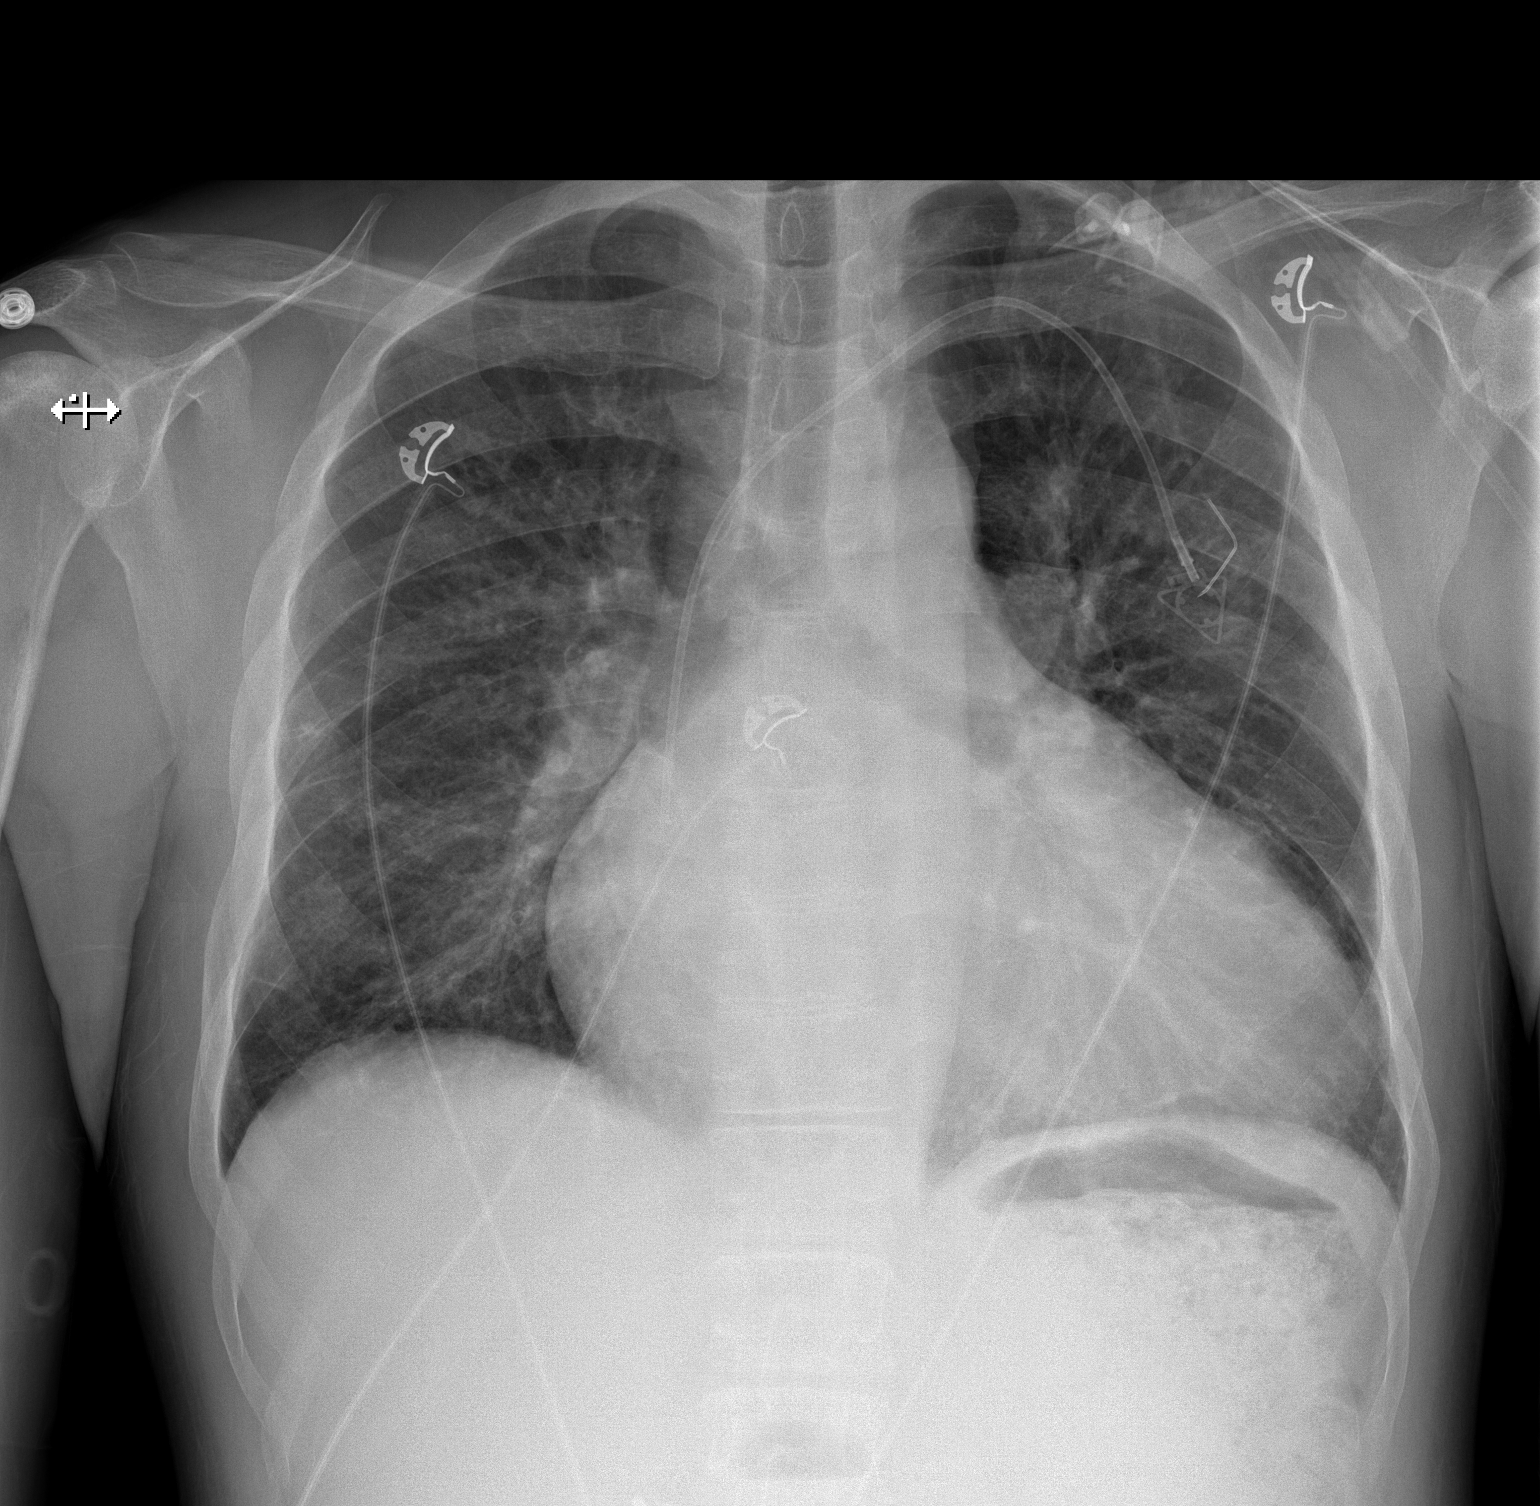

[w chest lat]
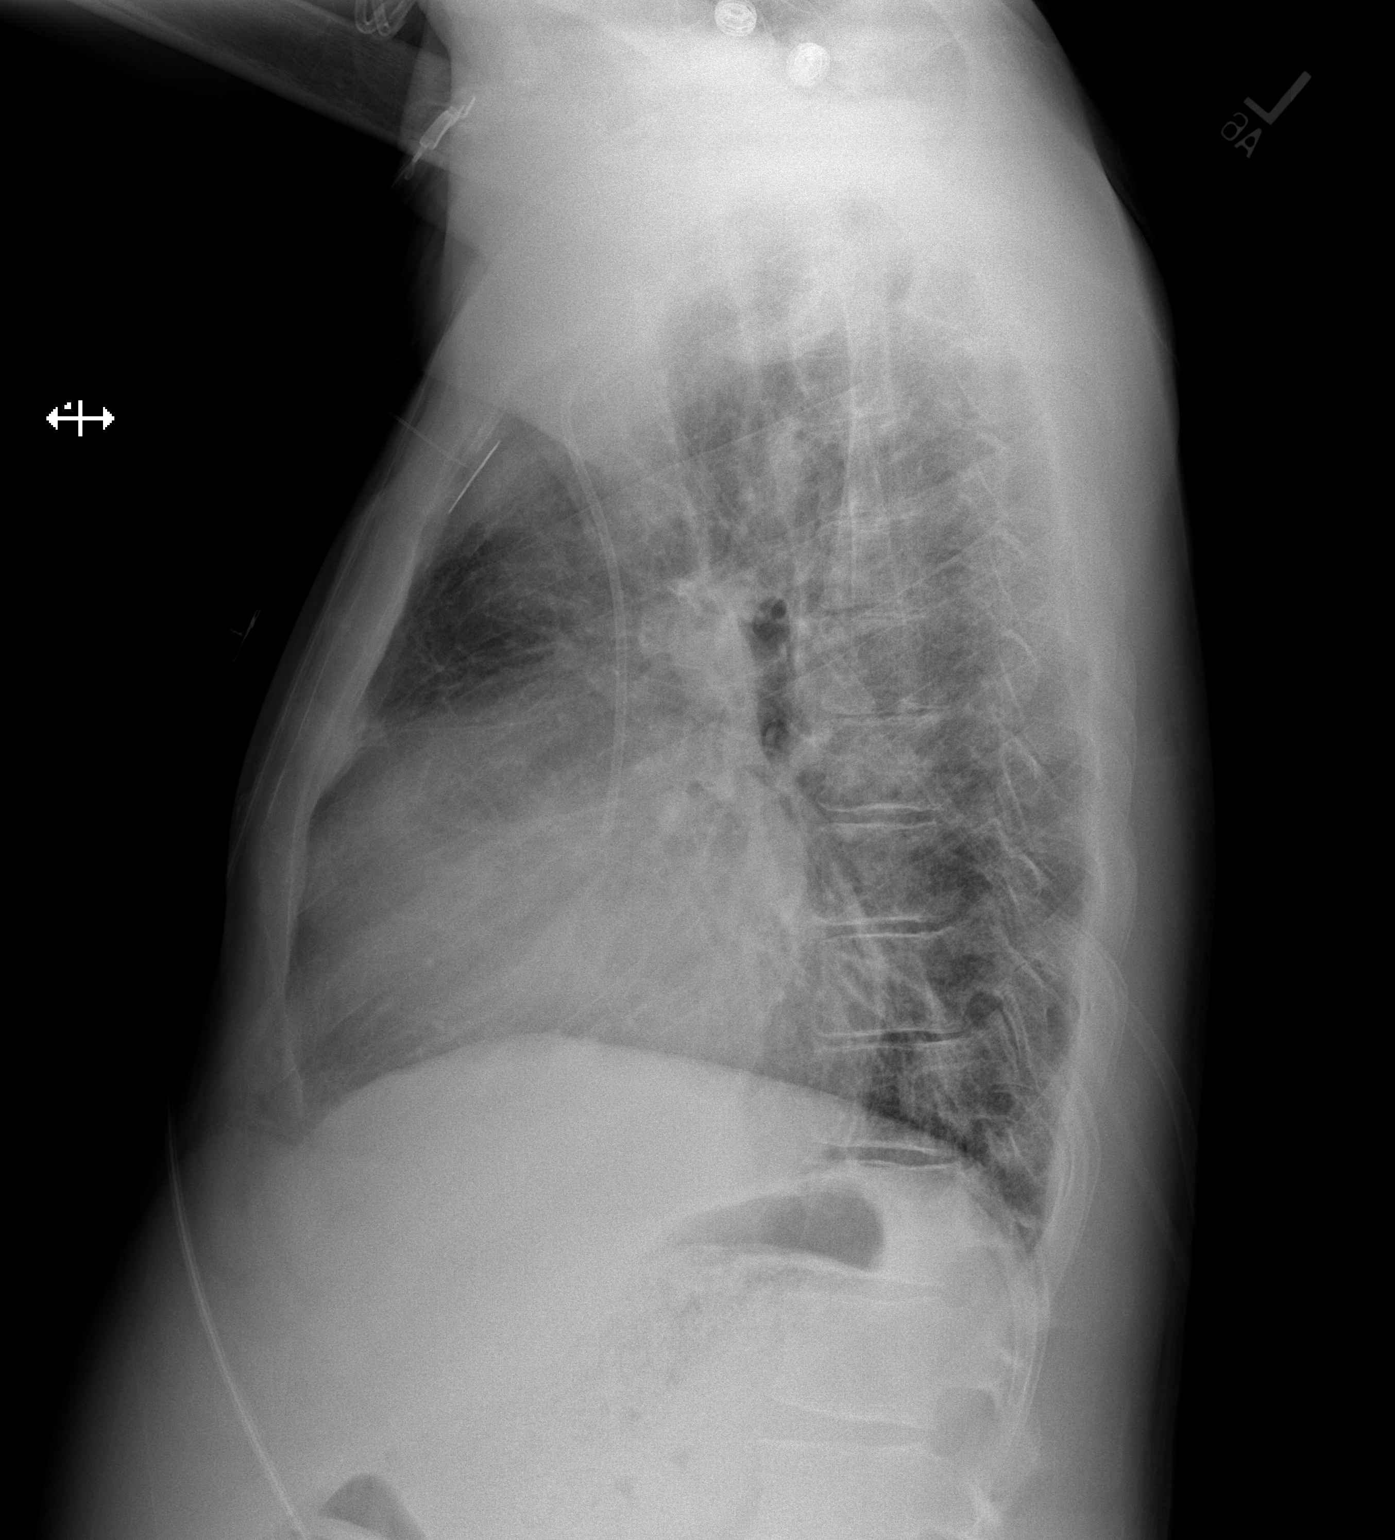

[2 of 2 positions shown; findings below may reference images not displayed]

FINDINGS: The lungs are clear wiithout focal pneumonia, edema, pneumothorax or
pleural effusion. The cardio pericardial silhouette is enlarged.
There is pulmonary vascular congestion without overt pulmonary
edema. Nodular areas in the peripheral aspect of the right mid lung
were seen on CT scan from 2 weeks ago. Left Port-A-Cath tip projects
at the SVC/RA junction. Telemetry leads overlie the chest.
IMPRESSION: Cardiomegaly with pulmonary vascular congestion.

Nodular density right mid lung better seen on recent CT scan.

## 2018-10-18 IMAGING — US US RENAL
1 series · 14 of 25 positions shown · non-contrast
Comparison: CT abdomen and pelvis July 26, 2016

CLINICAL DATA: Acute renal failure. History of hypertension sickle
cell anemia.

EXAM:
RENAL / URINARY TRACT ULTRASOUND COMPLETE

[Series 1: us renal · 0.25mm/px · 14 of 29 slices shown]
[im 1/29]
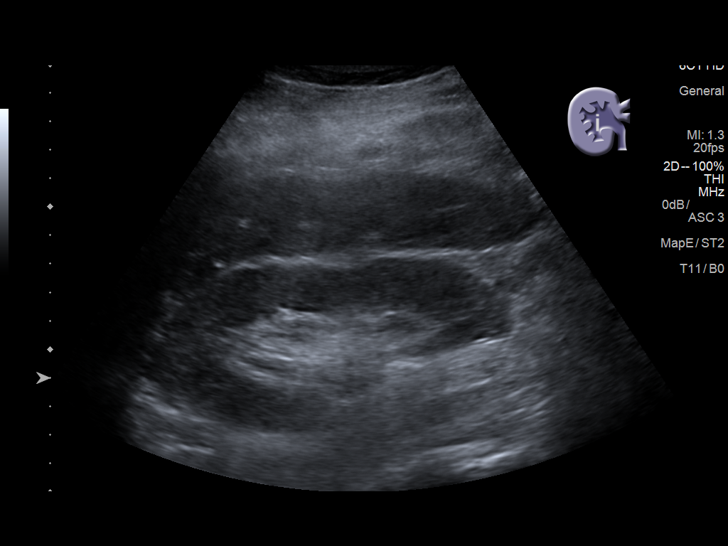
[im 3/29]
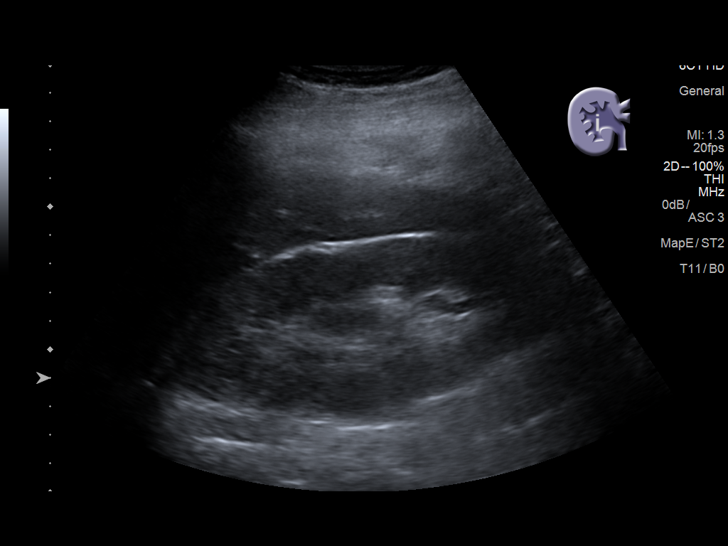
[im 5/29]
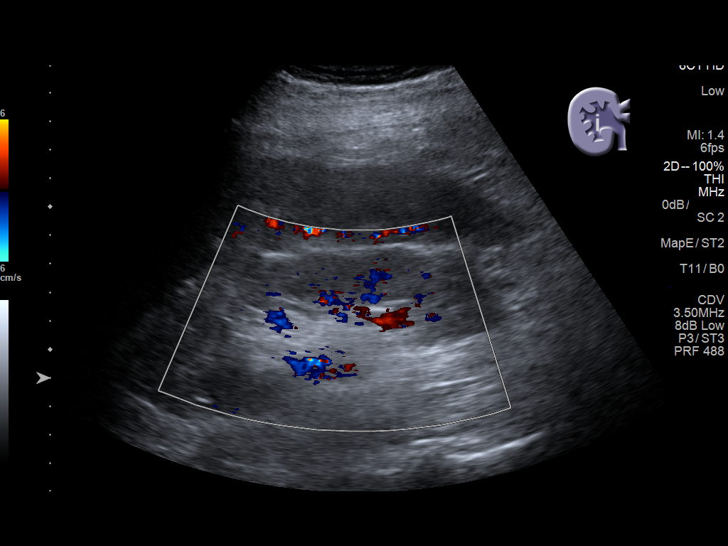
[im 8/29]
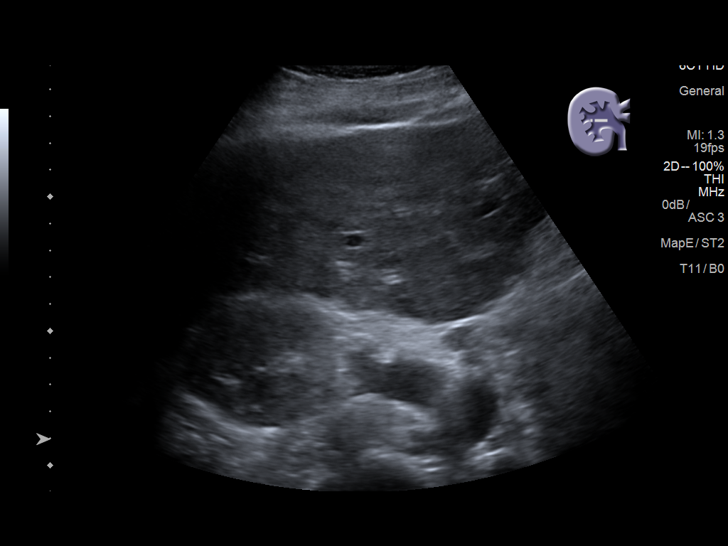
[im 10/29]
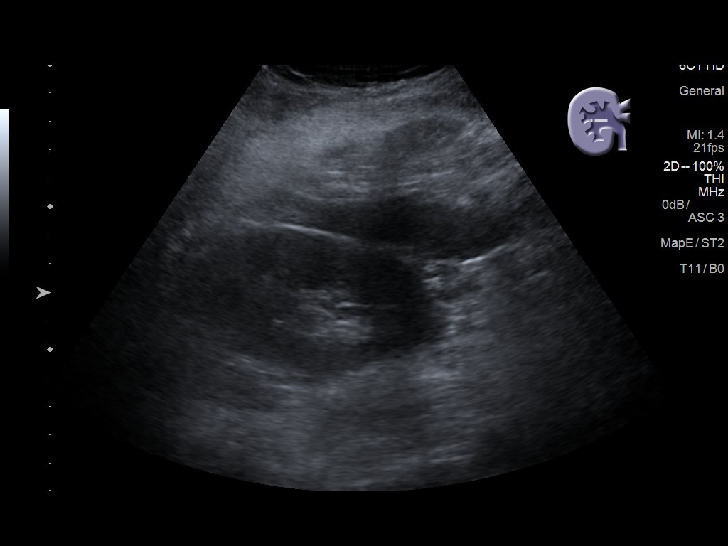
[im 11/29]
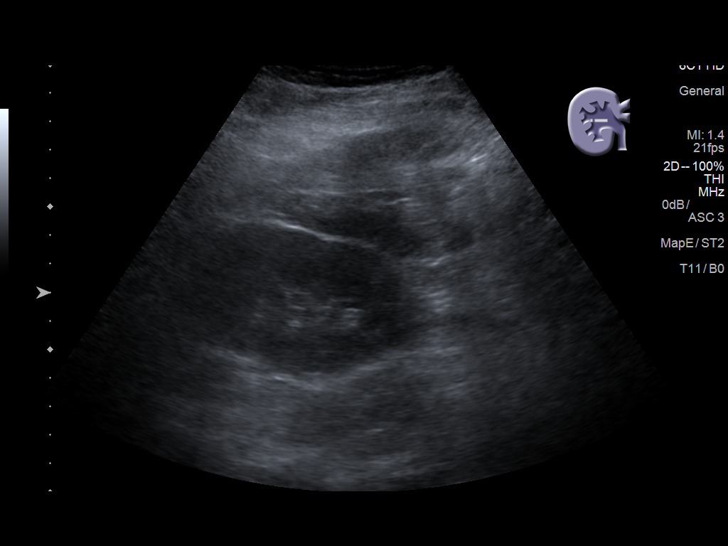
[im 13/29]
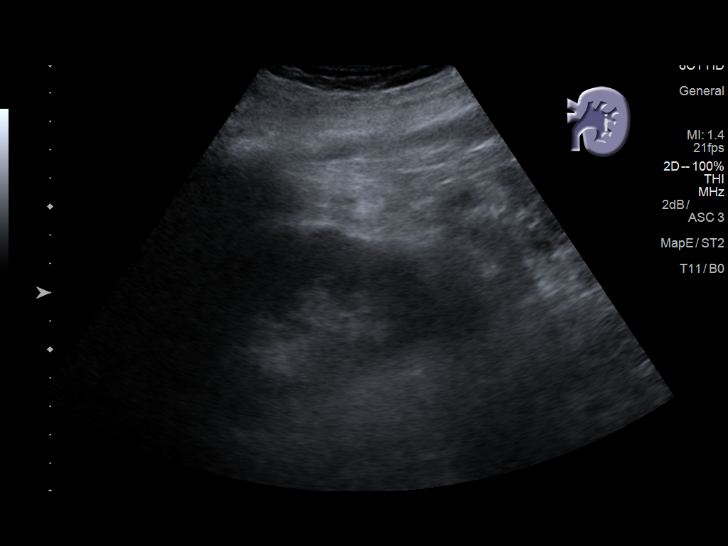
[im 16/29]
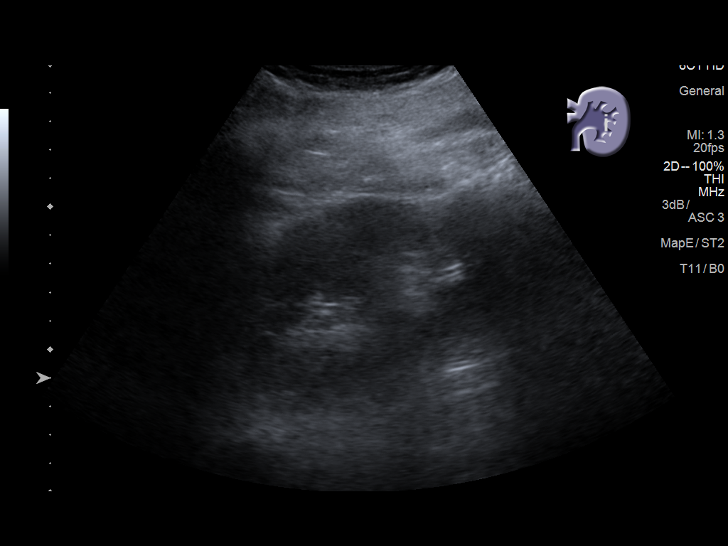
[im 18/29]
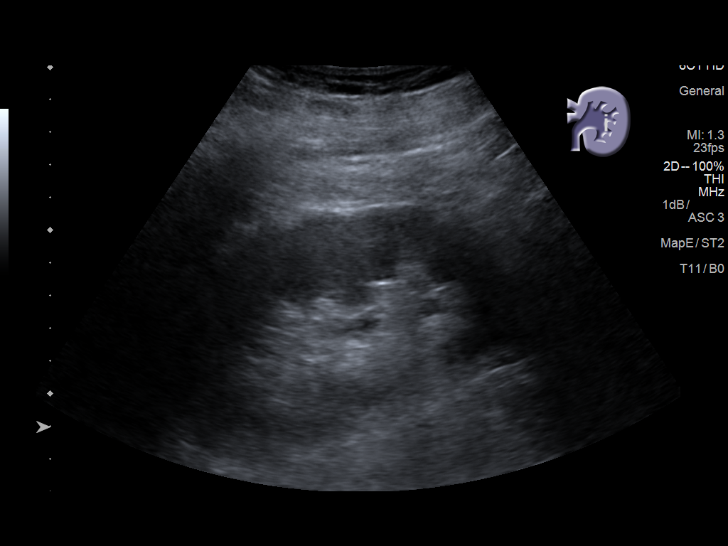
[im 19/29]
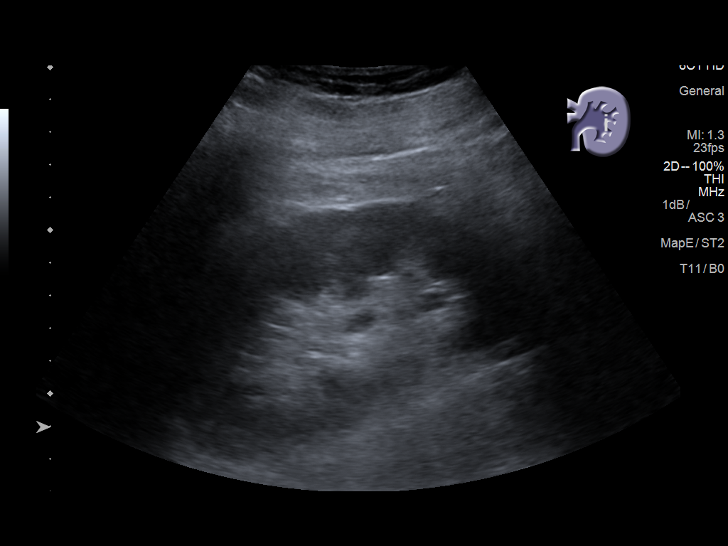
[im 22/29]
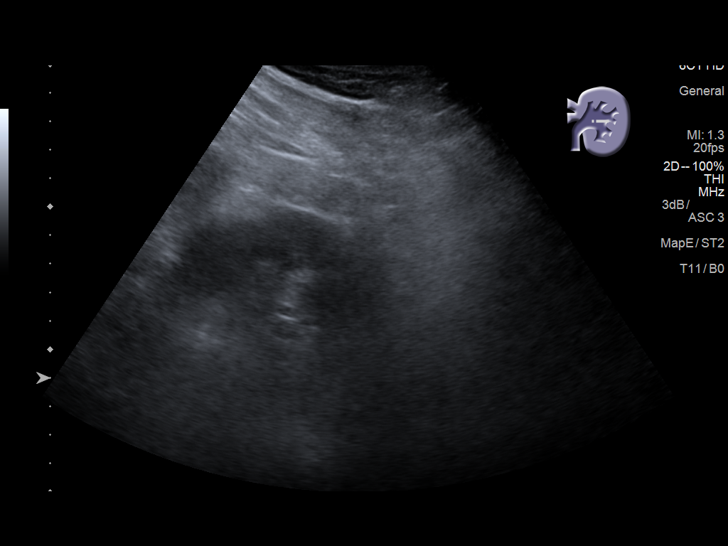
[im 24/29]
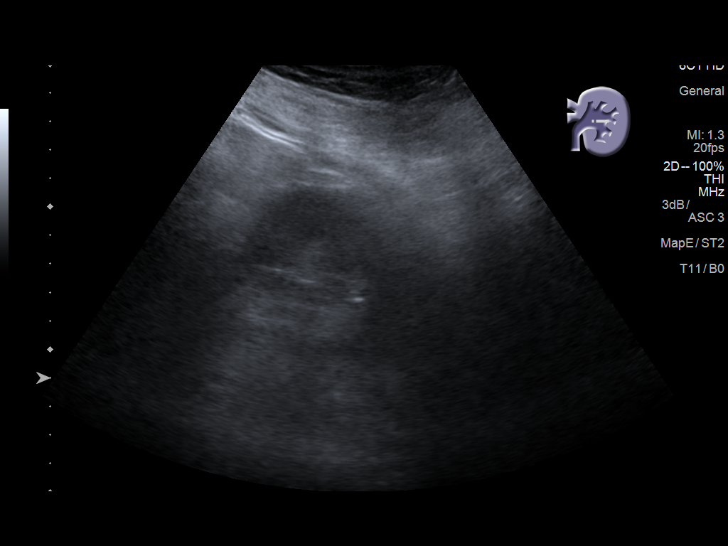
[im 26/29]
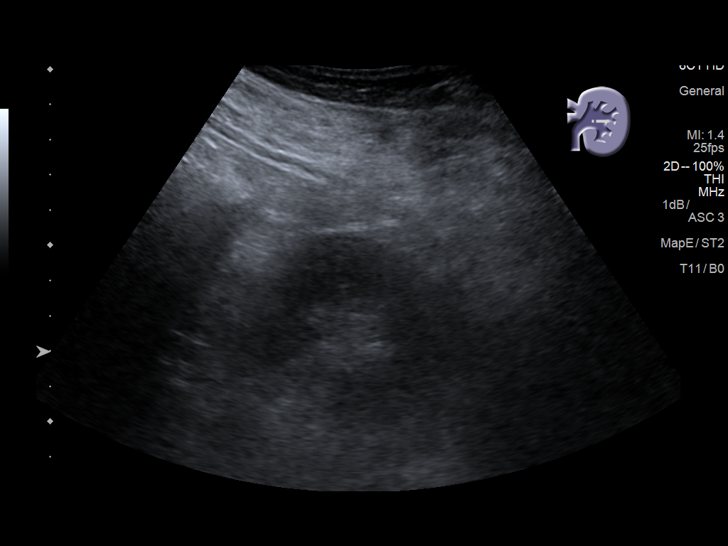
[im 29/29]
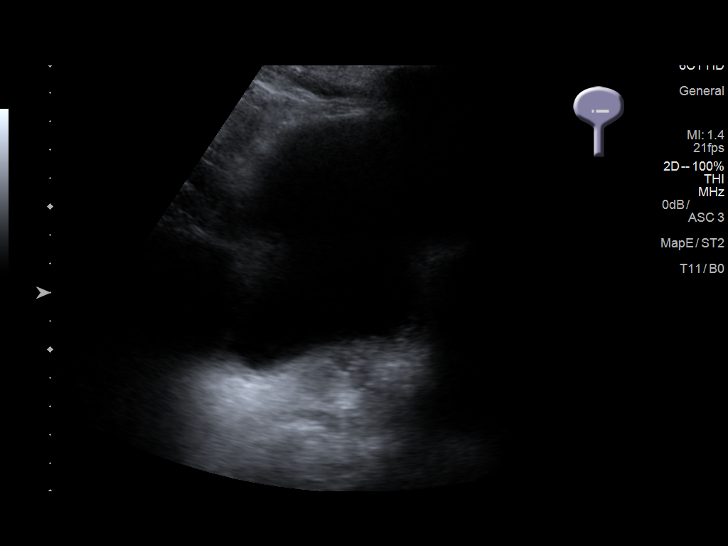

[14 of 25 positions shown; findings below may reference images not displayed]

FINDINGS: Right Kidney:

Length: 13.6 cm. Echogenicity within normal limits. No mass or
hydronephrosis visualized. Multifocal scarring.

Left Kidney:

Length: 12.6 cm. Echogenicity within normal limits. No mass or
hydronephrosis visualized. Multifocal scarring.

Bladder:

Appears normal for degree of bladder distention.
IMPRESSION: 1. No obstructive uropathy.
2. Cortical scarring, better demonstrated on prior CT.

## 2018-11-07 IMAGING — CT CT ABD-PELV W/ CM
2 of 4 series · 16 of 46 positions shown, 18 images · IV contrast (ISOVUE)
Comparison: 06/03/2015

CLINICAL DATA: Upper abdominal pain, nausea, vomiting since 7 a.m.

EXAM:
CT ABDOMEN AND PELVIS WITH CONTRAST
TECHNIQUE: Multidetector CT imaging of the abdomen and pelvis was performed
using the standard protocol following bolus administration of
intravenous contrast.
CONTRAST:  100mL HYFTUW-X44 IOPAMIDOL (HYFTUW-X44) INJECTION 61%

[Series 2: abd/pel with · axial · 0.74mm/px · z∈[-282,+112]mm · 13 of 89 slices shown, 15 images]
[im 5/89  soft-tissue]
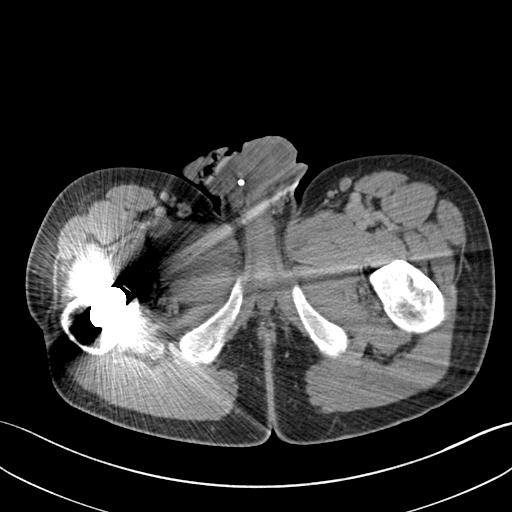
[im 5/89  bone]
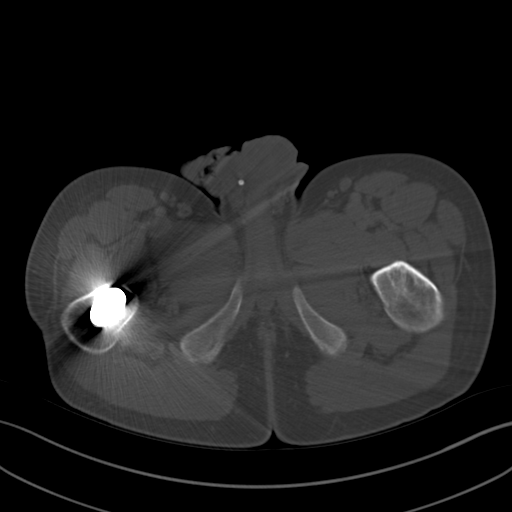
[im 14/89  soft-tissue]
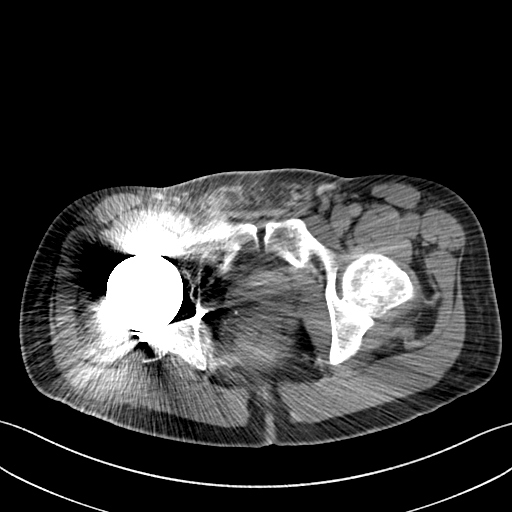
[im 19/89  soft-tissue]
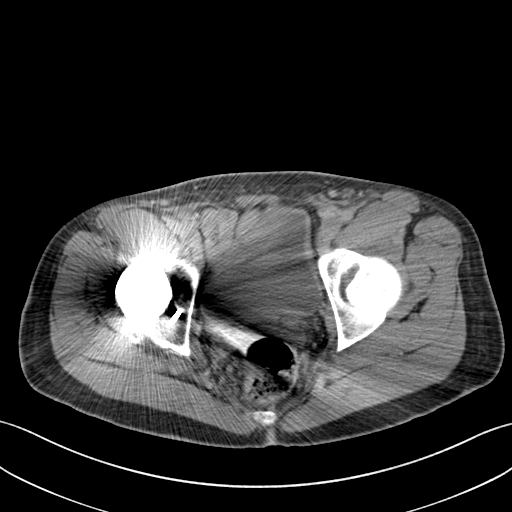
[im 24/89  soft-tissue]
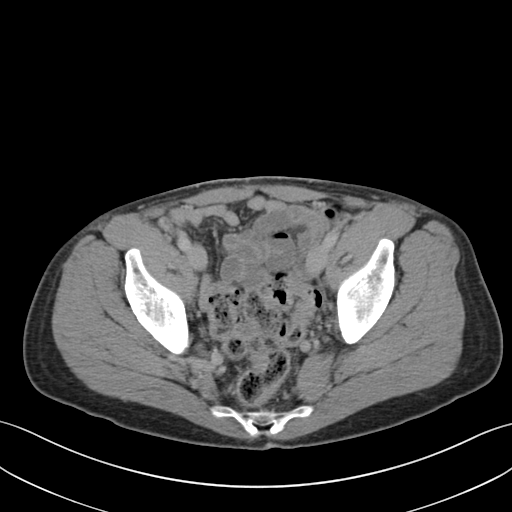
[im 33/89  soft-tissue]
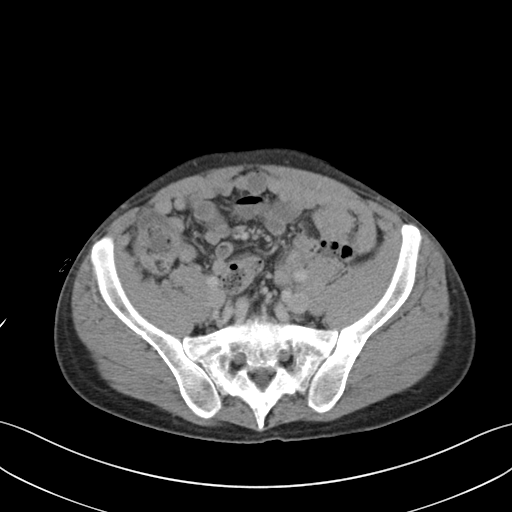
[im 38/89  soft-tissue]
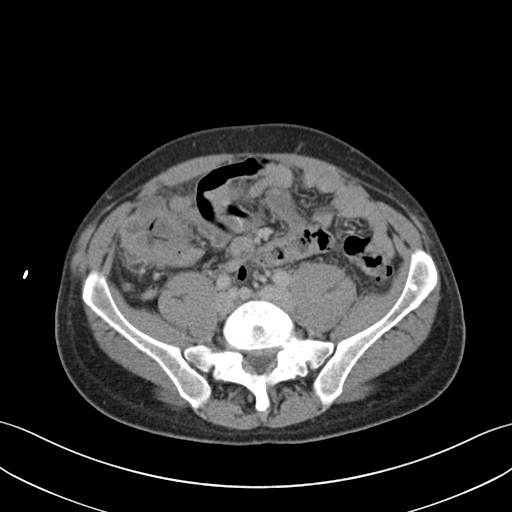
[im 47/89  soft-tissue]
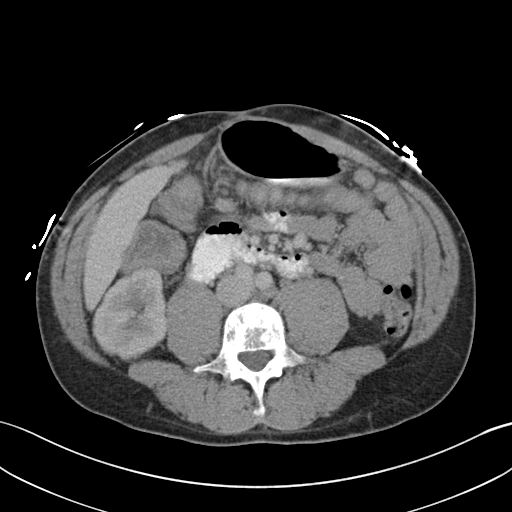
[im 51/89  soft-tissue]
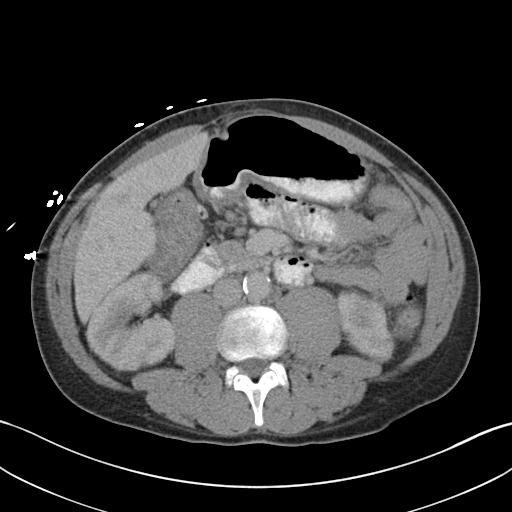
[im 56/89  soft-tissue]
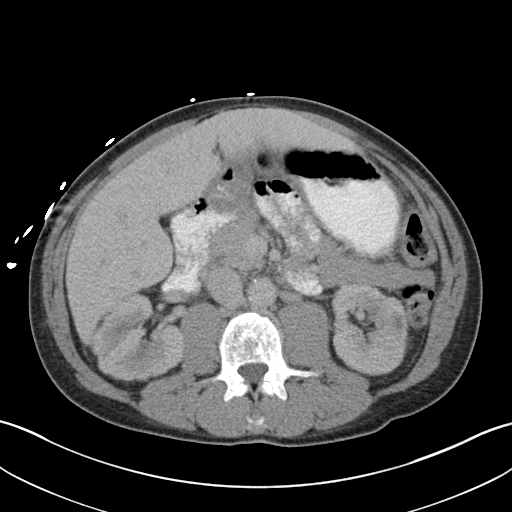
[im 56/89  bone]
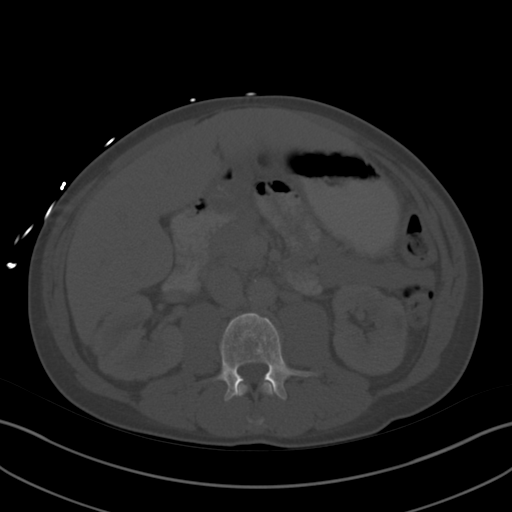
[im 65/89  soft-tissue]
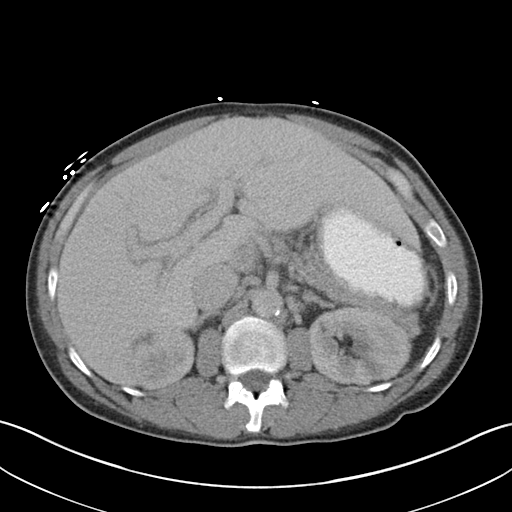
[im 70/89  soft-tissue]
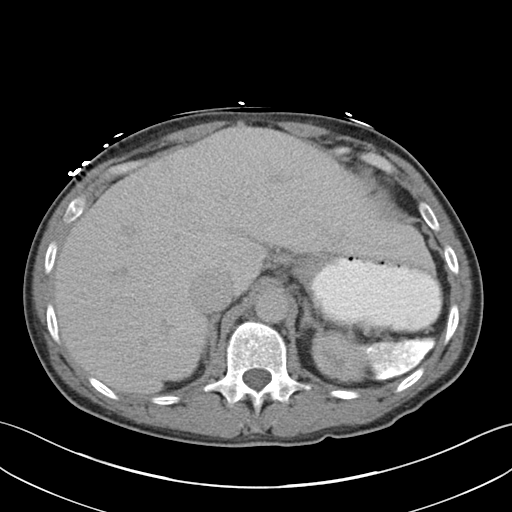
[im 75/89  soft-tissue]
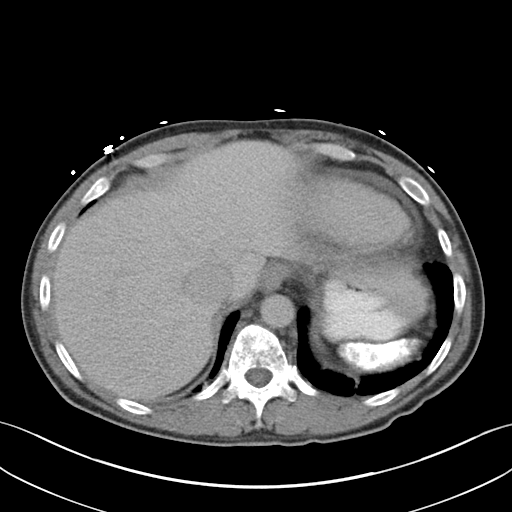
[im 84/89  soft-tissue]
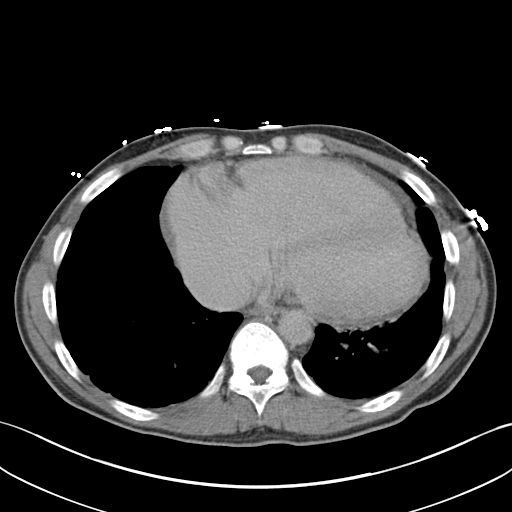

[Series 3: coronal a/|p · coronal · 0.71mm/px · 3 of 139 slices shown]
[im 47/139  soft-tissue]
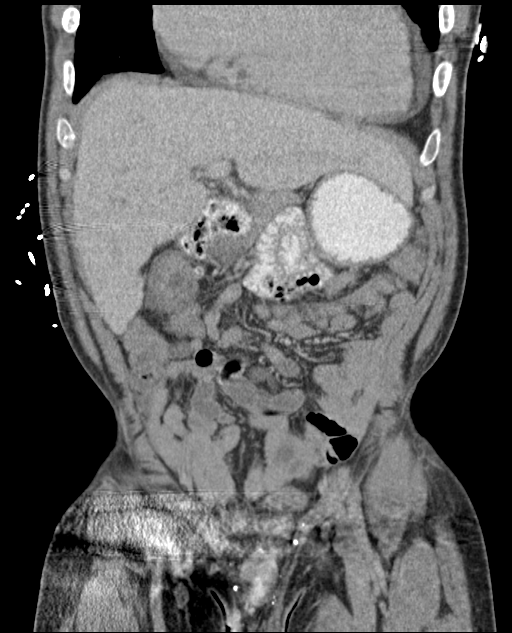
[im 62/139  soft-tissue]
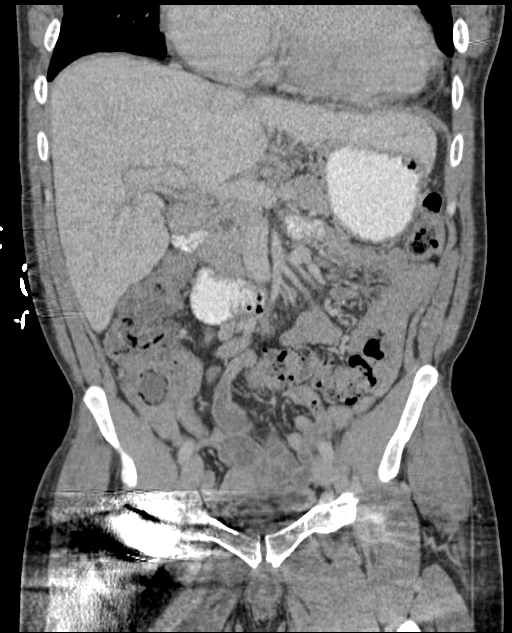
[im 77/139  soft-tissue]
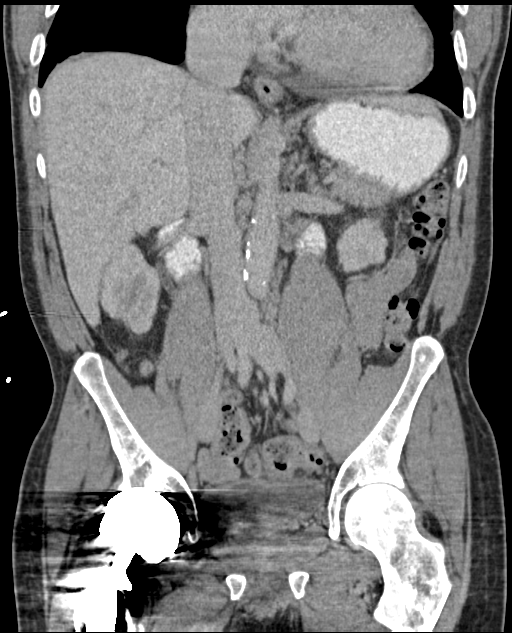

[16 of 46 positions shown; findings below may reference images not displayed]

FINDINGS: Lower chest: Cardiomegaly.  Mild lingular and bibasilar atelectasis.

Hepatobiliary: No focal liver abnormality is seen. Status post
cholecystectomy. No biliary dilatation.

Pancreas: Unremarkable. No pancreatic ductal dilatation or
surrounding inflammatory changes.

Spleen: Extensive chronic splenic infarction with calcification.

Adrenals/Urinary Tract: Adrenal glands are unremarkable. Kidneys are
normal, without renal calculi, focal lesion, or hydronephrosis.
Bladder is unremarkable.

Stomach/Bowel: Bowel wall thickening of the ascending and transverse
colon concerning for colitis. No pneumatosis, pneumoperitoneum or
portal venous gas. No bowel obstruction.

Vascular/Lymphatic: Normal caliber abdominal aorta with
atherosclerosis. No lymphadenopathy.

Reproductive: Prostate is unremarkable.

Other: No fluid collection or hematoma.  No abdominal wall hernia.

Musculoskeletal: No acute osseous abnormality. No aggressive is
lytic or sclerotic osseous lesion. Mild osteosclerosis consistent
with sickle cell anemia. Right total hip arthroplasty without
failure complication. Mild subchondral sclerosis in the left femoral
head concerning for avascular necrosis.
IMPRESSION: 1. Bowel wall thickening of the ascending and transverse colon
concerning for colitis which may be secondary to an infectious,
inflammatory or ischemic etiology given the patient's history of
sickle cell anemia.
2. Mild subchondral sclerosis in the left femoral head concerning
for avascular necrosis.
3. Stable cardiomegaly.

## 2018-11-08 IMAGING — DX DG ABDOMEN 1V
1 series · 1 of 1 positions shown · non-contrast
Comparison: CT yesterday

CLINICAL DATA: Acute abdominal pain.  Nausea and vomiting.

EXAM:
ABDOMEN - 1 VIEW

[abdomen kub]
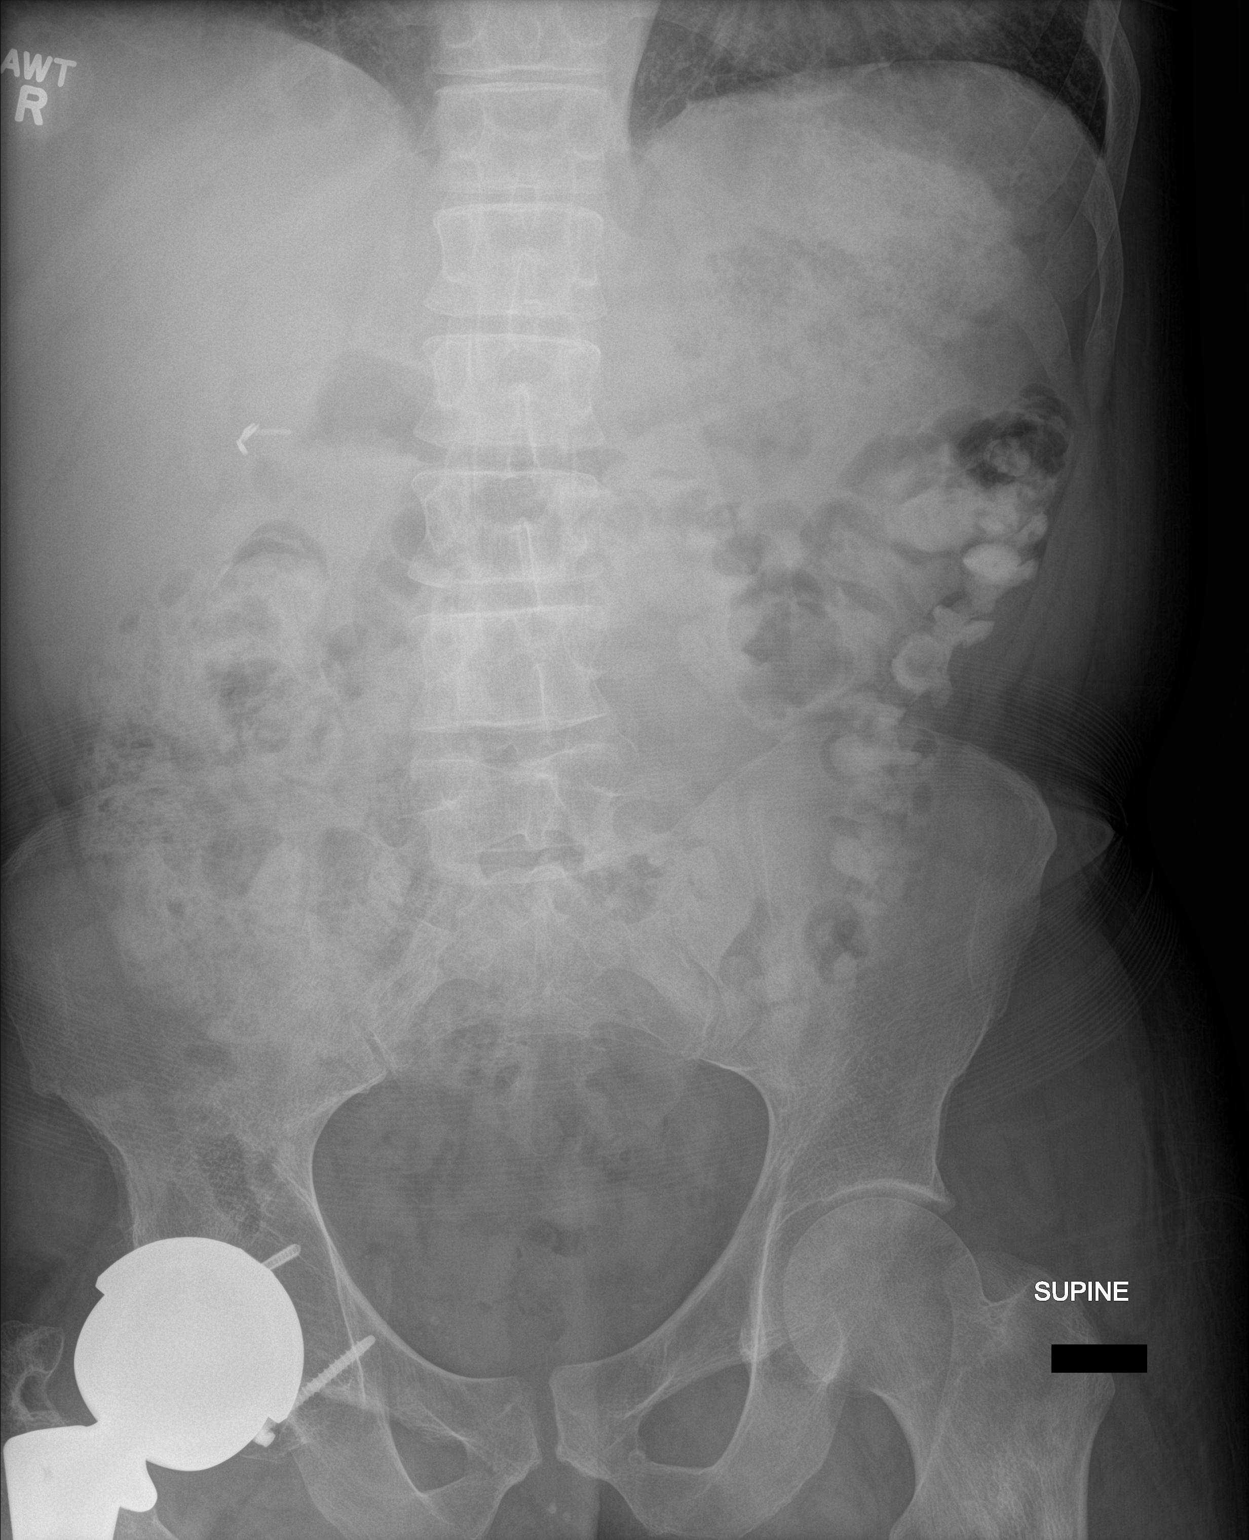

[1 of 1 positions shown; findings below may reference images not displayed]

FINDINGS: Enteric contrast from CT yesterday in the distal transverse and
descending colon. Small to moderate stool burden. No bowel
dilatation to suggest obstruction. No evidence of free air on
portable supine view. Cholecystectomy clips in the right upper
quadrant of the abdomen. Right hip arthroplasty in place.
IMPRESSION: No evidence of bowel obstruction or free air. Enteric contrast from
CT yesterday in the distal transverse and descending colon. Small to
moderate stool burden.

## 2018-11-13 IMAGING — DX DG CHEST 2V
2 series · 2 of 2 positions shown · non-contrast
Comparison: 11/27/2015

CLINICAL DATA: Hypoxia with shortness of breath history of sickle
cell

EXAM:
CHEST  2 VIEW

[chest pa]
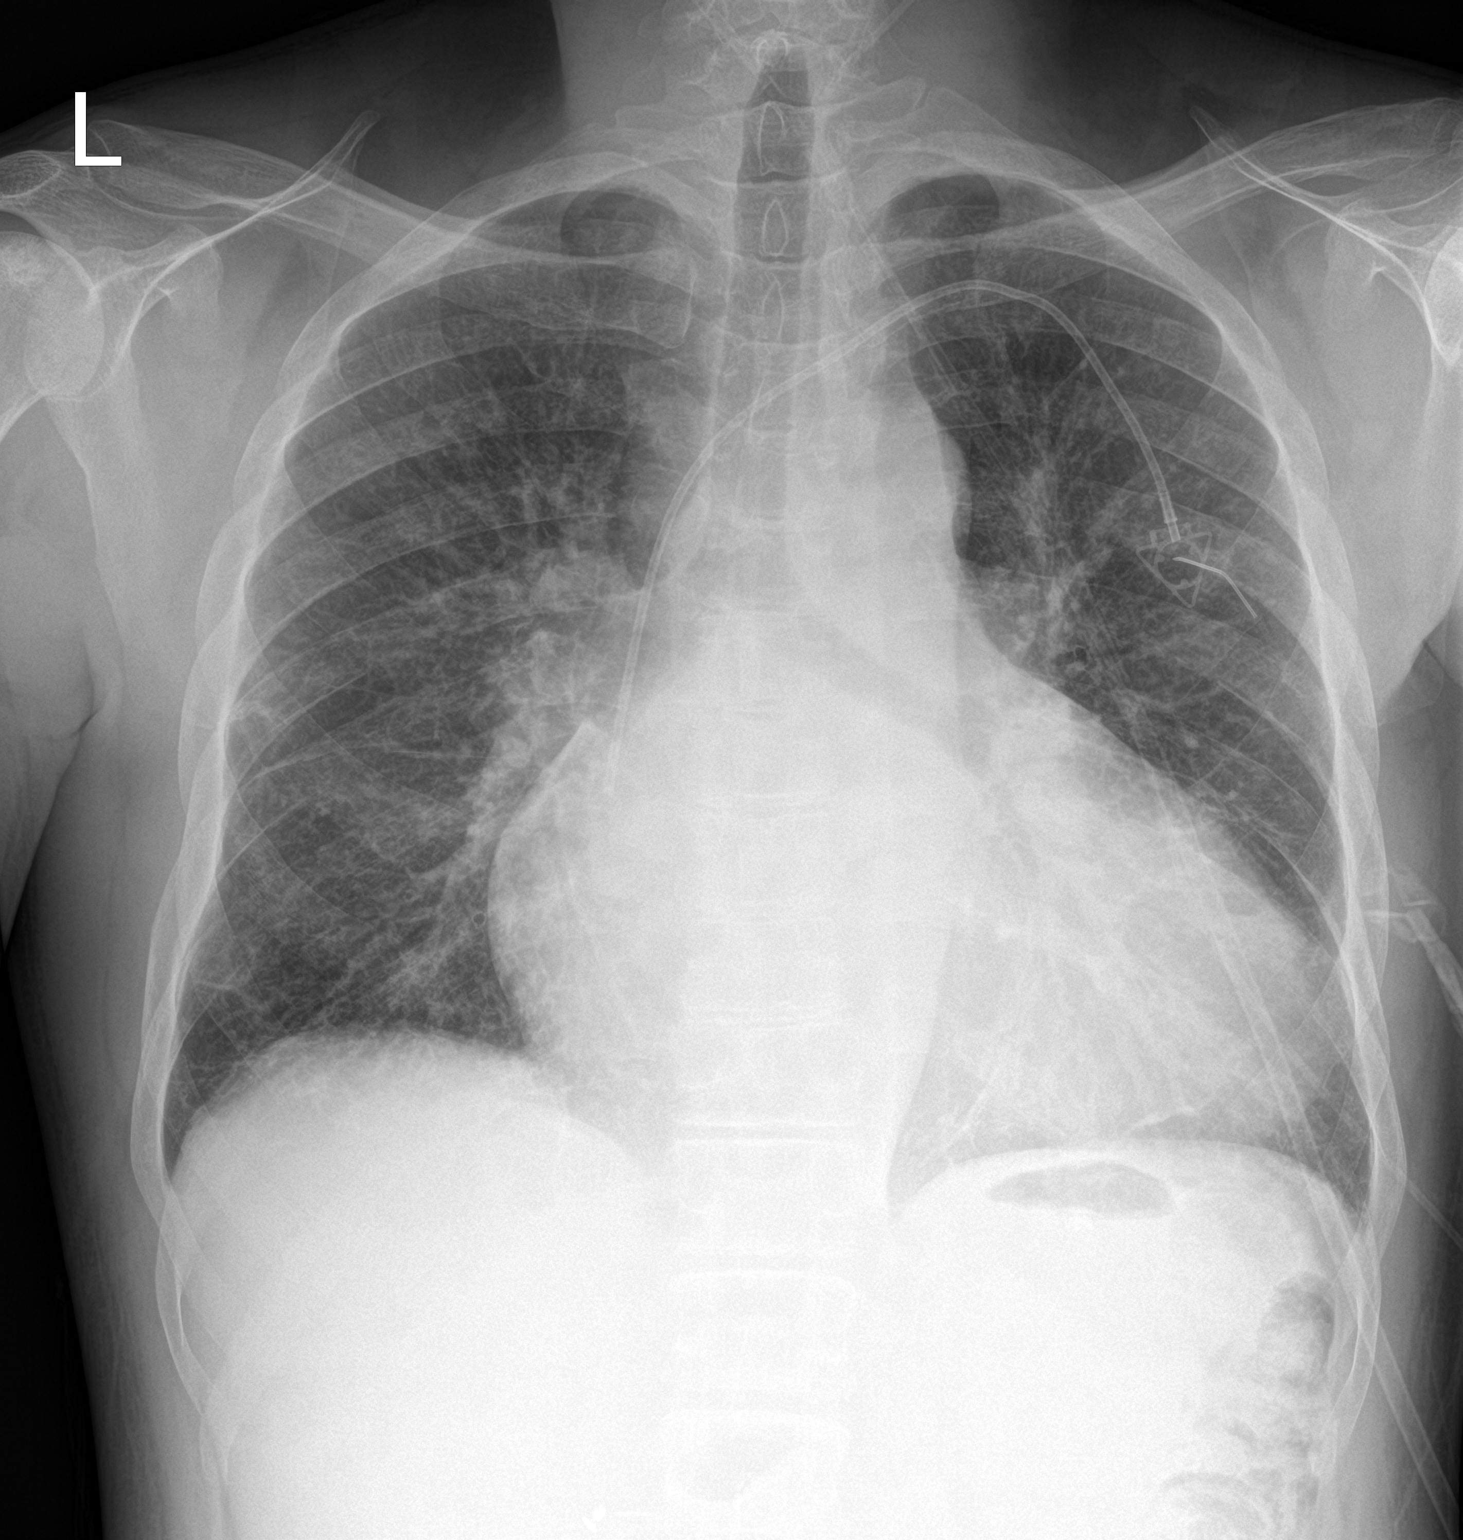

[chest lat]
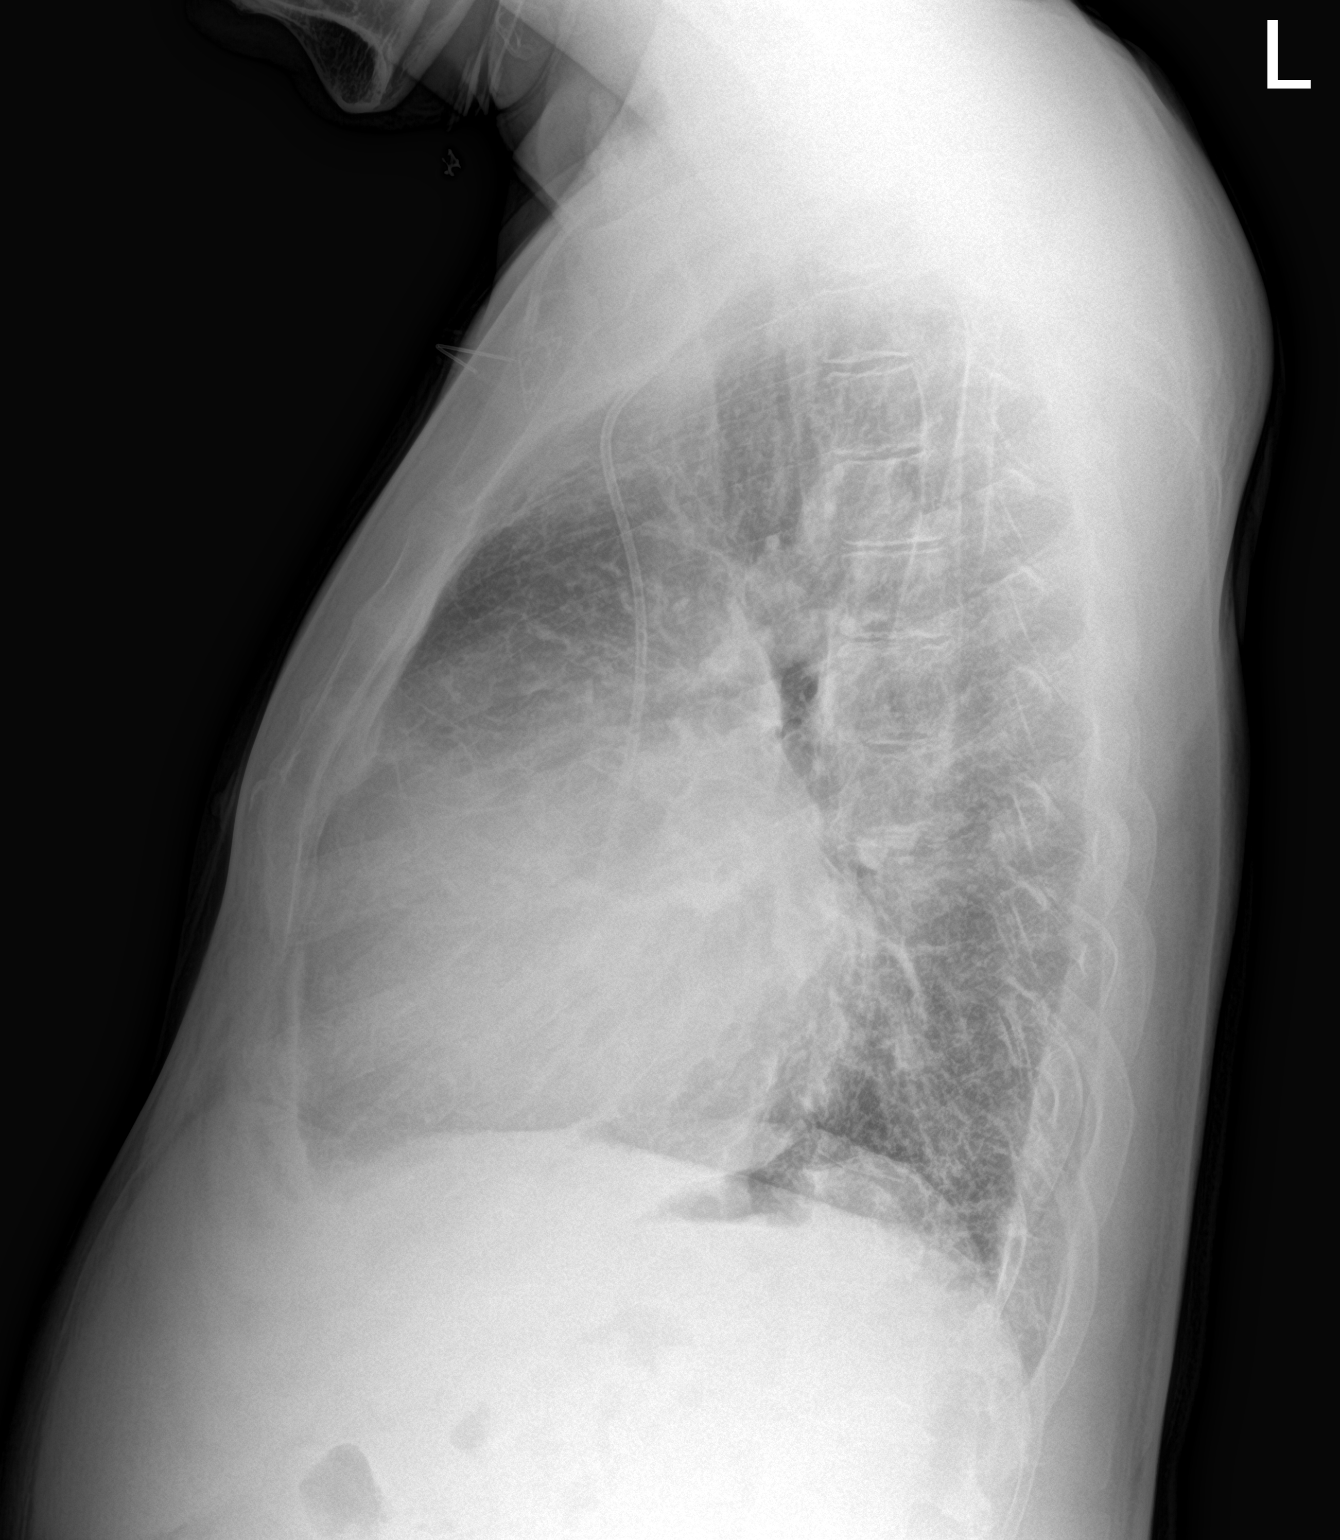

[2 of 2 positions shown; findings below may reference images not displayed]

FINDINGS: A left-sided central venous port tip overlies the distal SVC.
Moderate-to-marked cardiomegaly is similar compared to the prior
study. No effusion. No consolidation. No pneumothorax. Small amount
of atelectasis at the left base.
IMPRESSION: Cardiomegaly without acute infiltrate or overt pulmonary edema.

## 2018-11-19 IMAGING — CR DG CHEST 2V
2 series · 2 of 2 positions shown · non-contrast
Comparison: 12/21/2015

CLINICAL DATA: Bilateral rib pain since 3 p.m. today. History of
sickle cell. History of acute chest syndrome.

EXAM:
CHEST  2 VIEW

[w chest pa]
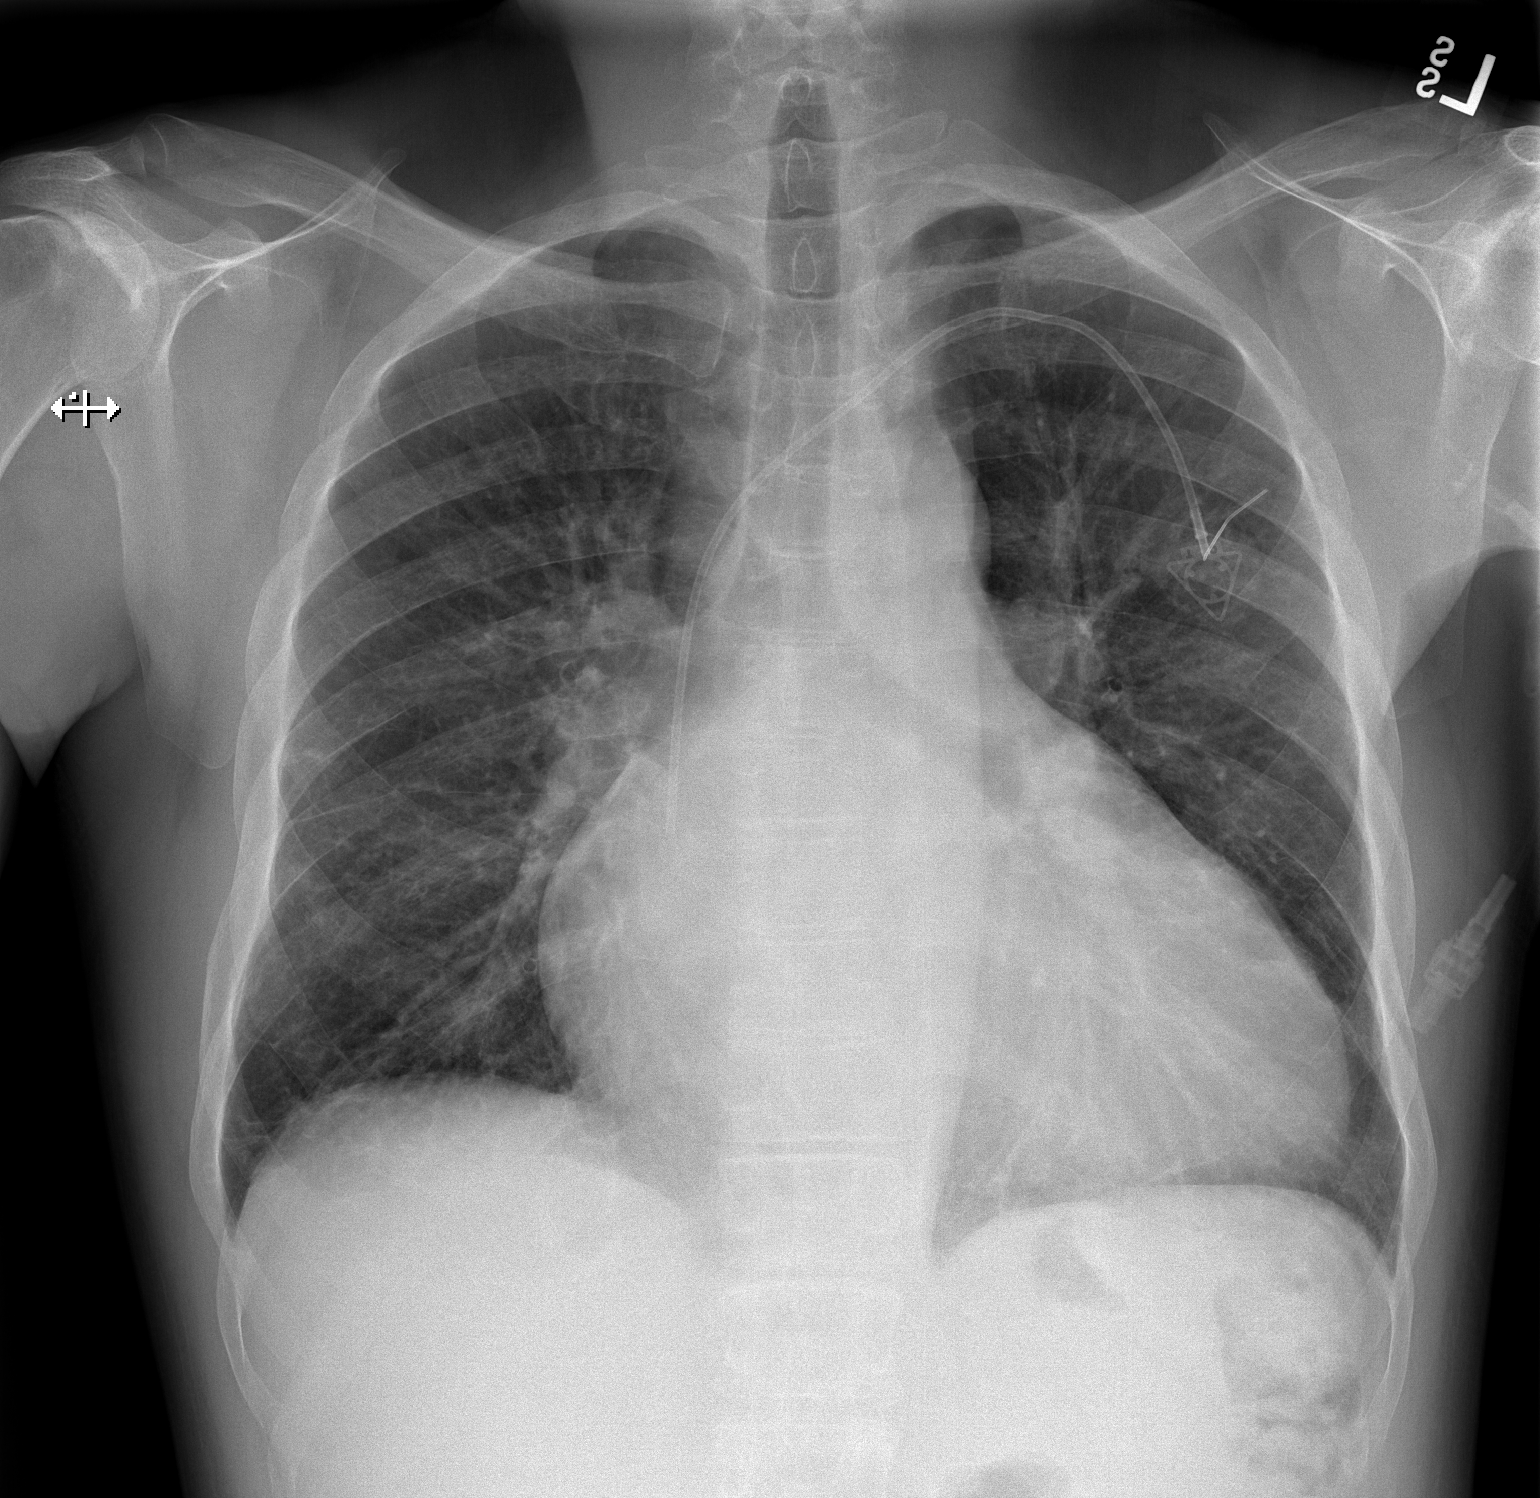

[w chest lat]
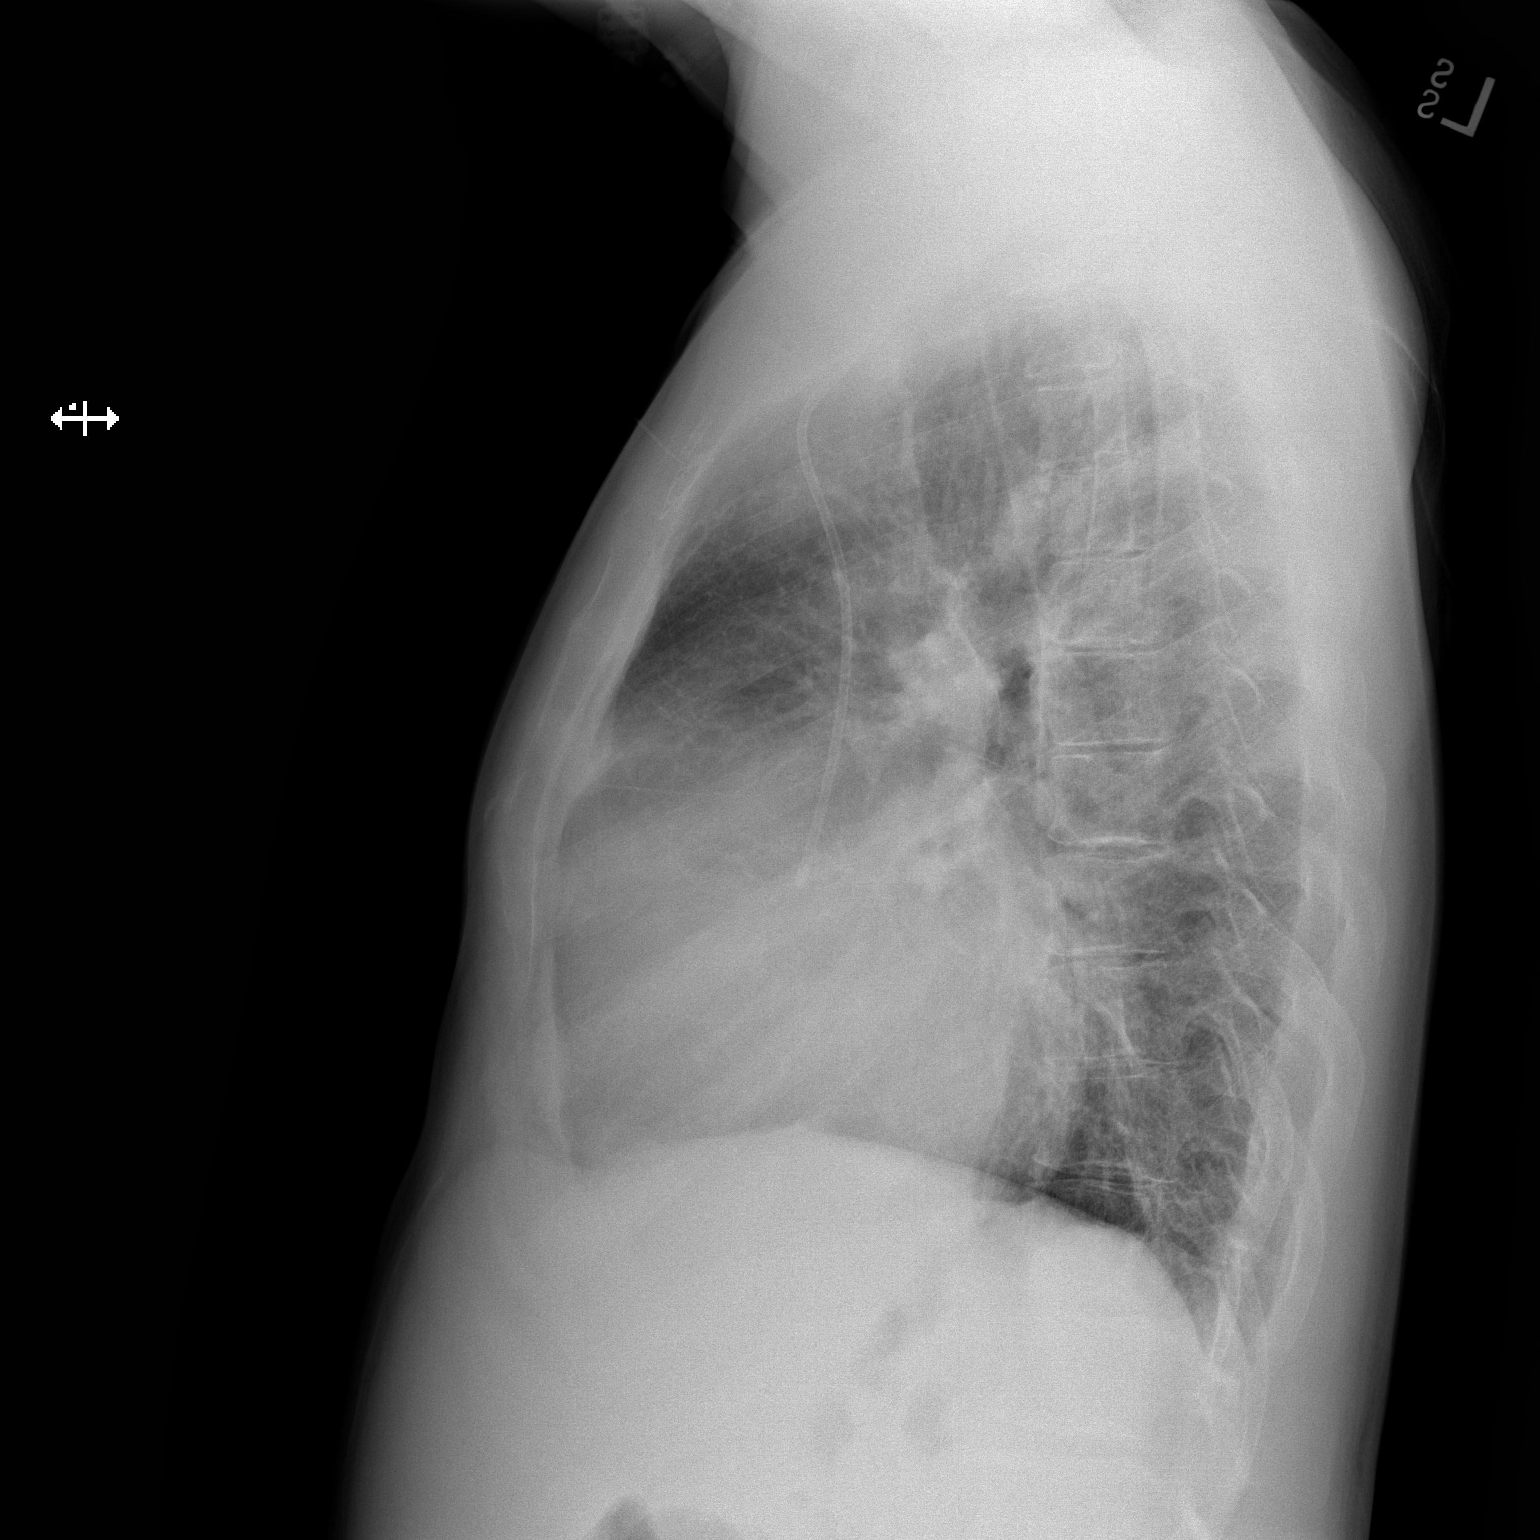

[2 of 2 positions shown; findings below may reference images not displayed]

FINDINGS: Power port type central venous catheter with tip over the cavoatrial
junction region. No pneumothorax. No change in position since prior
study. Cardiac enlargement without vascular congestion. No focal
consolidation or edema. No blunting of costophrenic angles. No
pneumothorax. Mediastinal contours appear intact.
IMPRESSION: Cardiac enlargement. No evidence of active pulmonary disease. No
focal consolidation.

## 2018-11-26 IMAGING — CR DG CHEST 2V
2 series · 2 of 2 positions shown · non-contrast
Comparison: 01/01/2016

CLINICAL DATA: Chest and back pain for 5 days.  Sickle cell anemia.

EXAM:
CHEST  2 VIEW

[w chest lat]
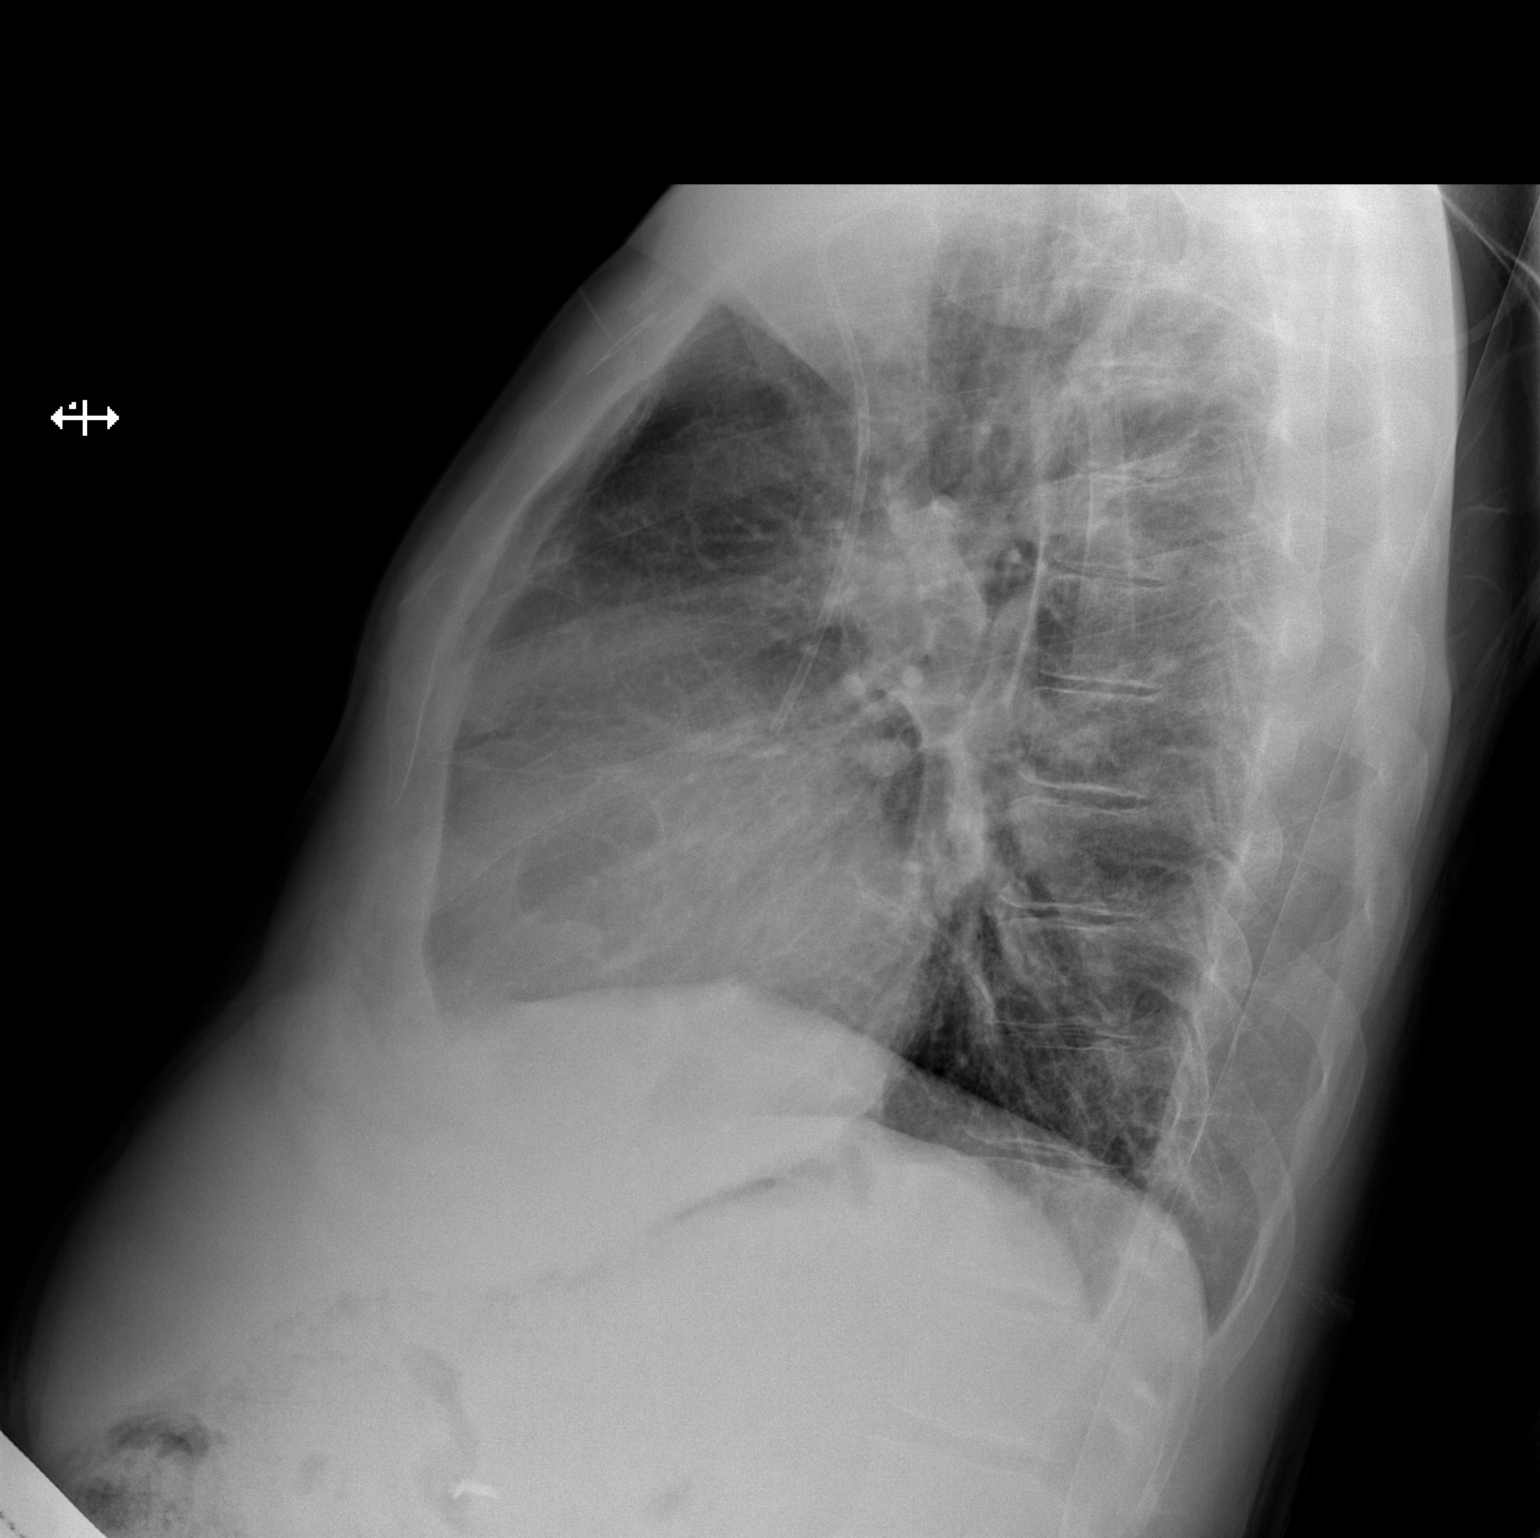

[x chest ap]
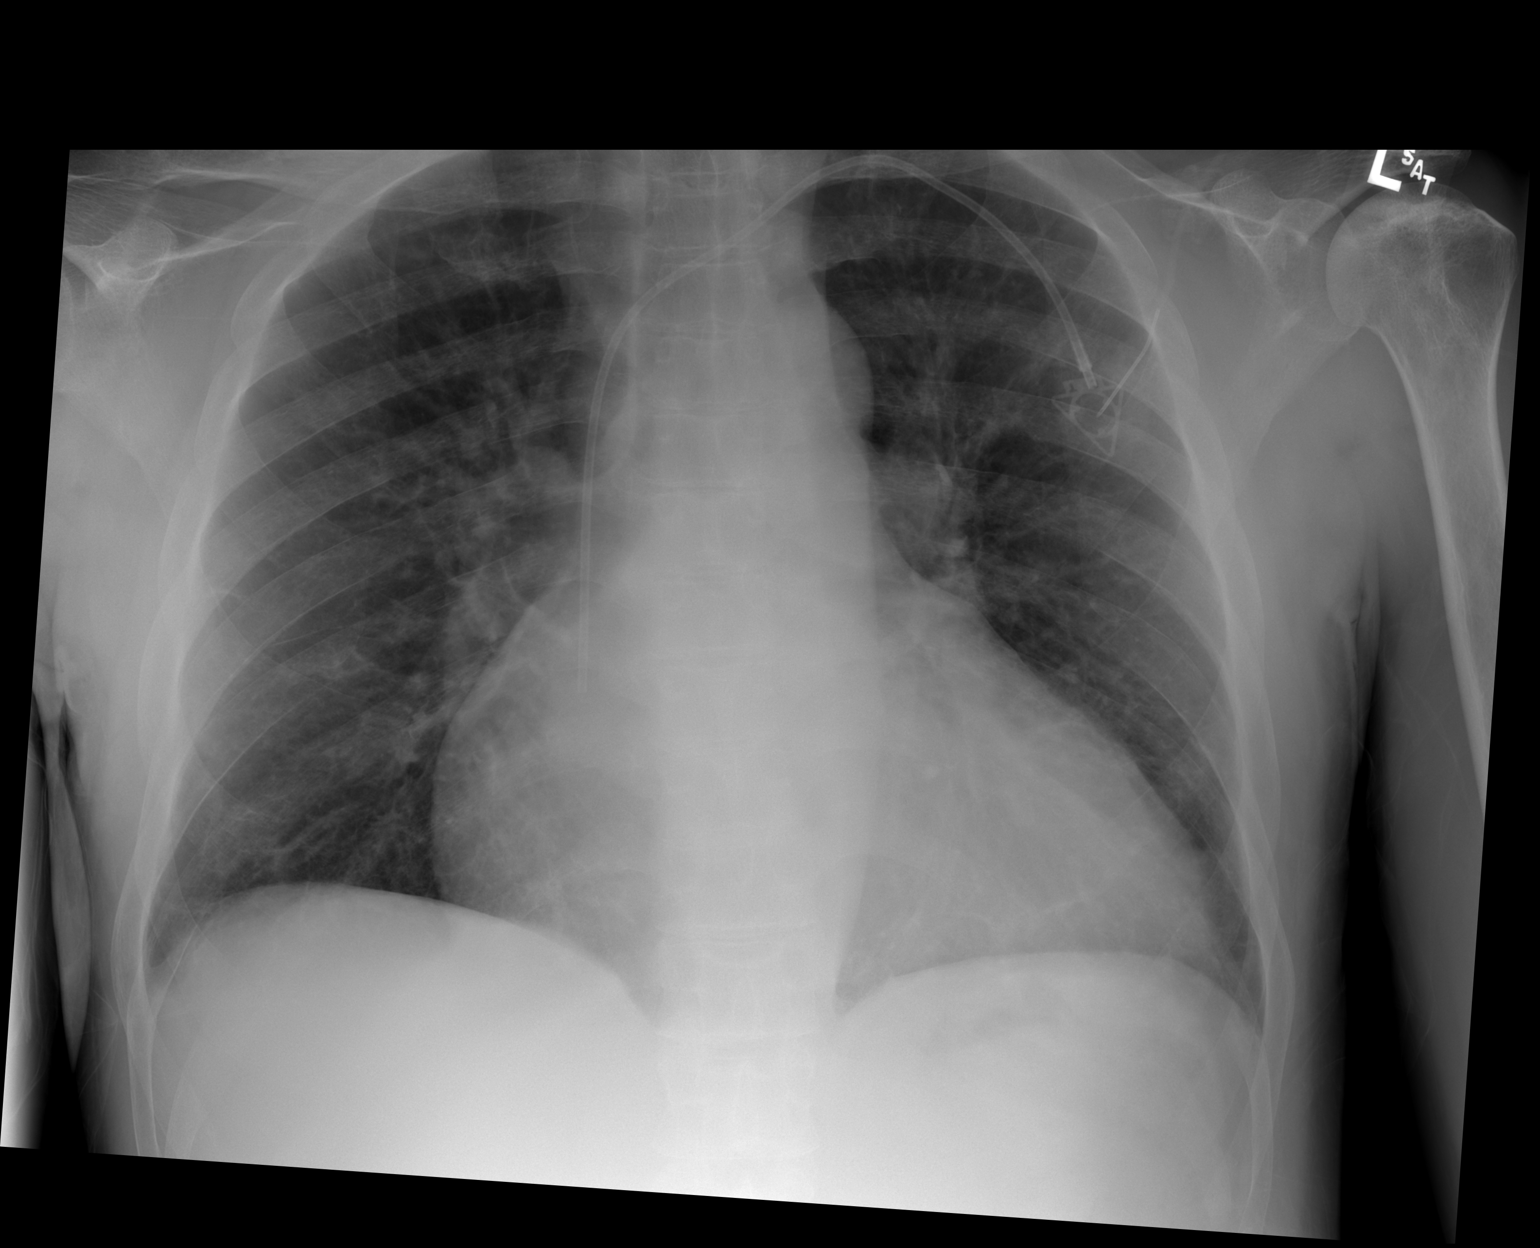

[2 of 2 positions shown; findings below may reference images not displayed]

FINDINGS: Mild to moderate cardiomegaly remains stable. Left-sided power port
remains in appropriate position.

Chronic pulmonary vascular congestion is again demonstrated. No
evidence of acute infiltrate or pulmonary edema. No evidence of
pleural effusion.
IMPRESSION: Stable cardiomegaly and pulmonary venous hypertension. No acute
findings.

## 2018-12-24 IMAGING — CR DG CHEST 2V
2 series · 2 of 2 positions shown · non-contrast
Comparison: Chest radiograph January 27, 2016 and CT chest
January 05, 2016

CLINICAL DATA: Bilateral rib pain. History of hypertension, chronic
pulmonary embolism and sickle cell anemia.

EXAM:
CHEST  2 VIEW

[w chest pa]
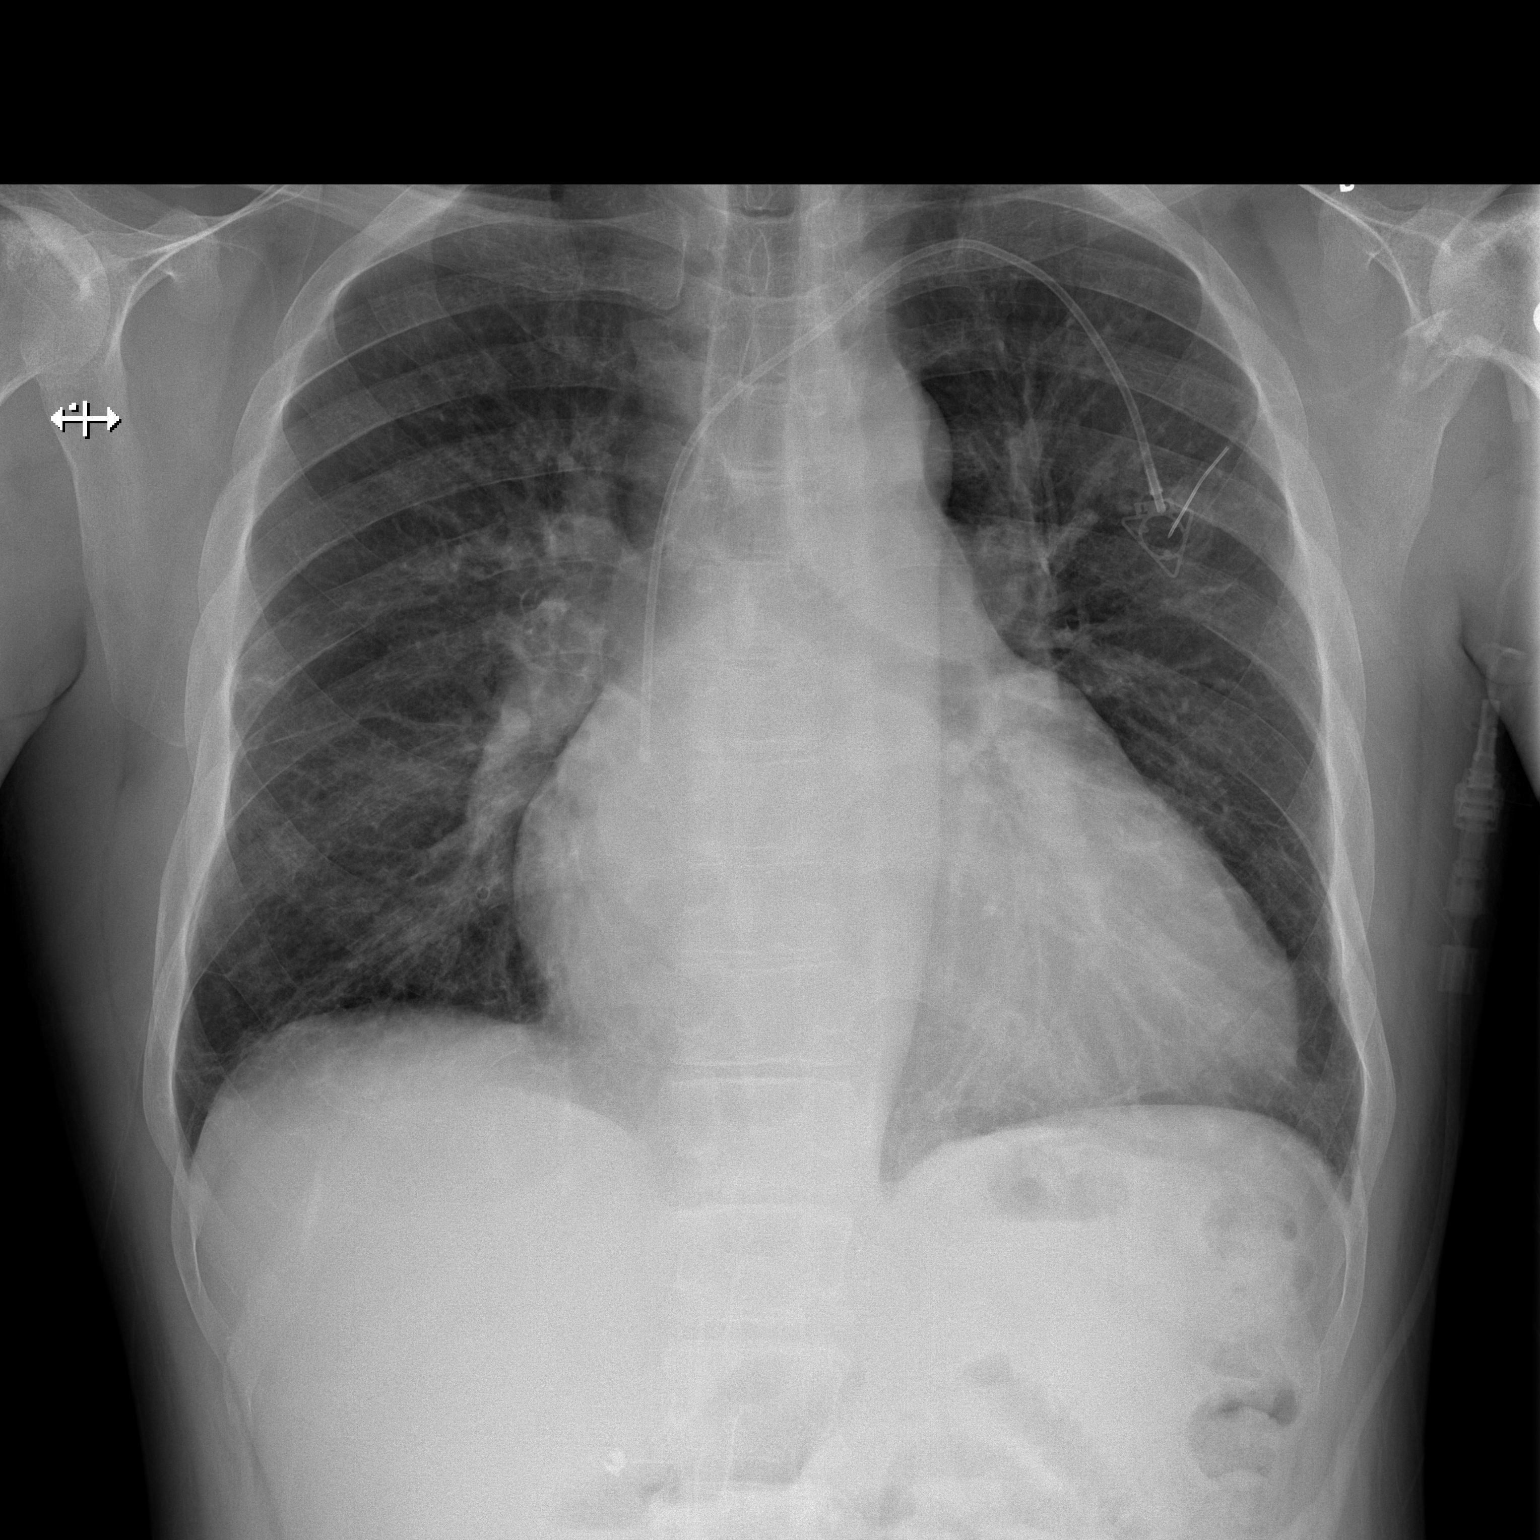

[w chest lat]
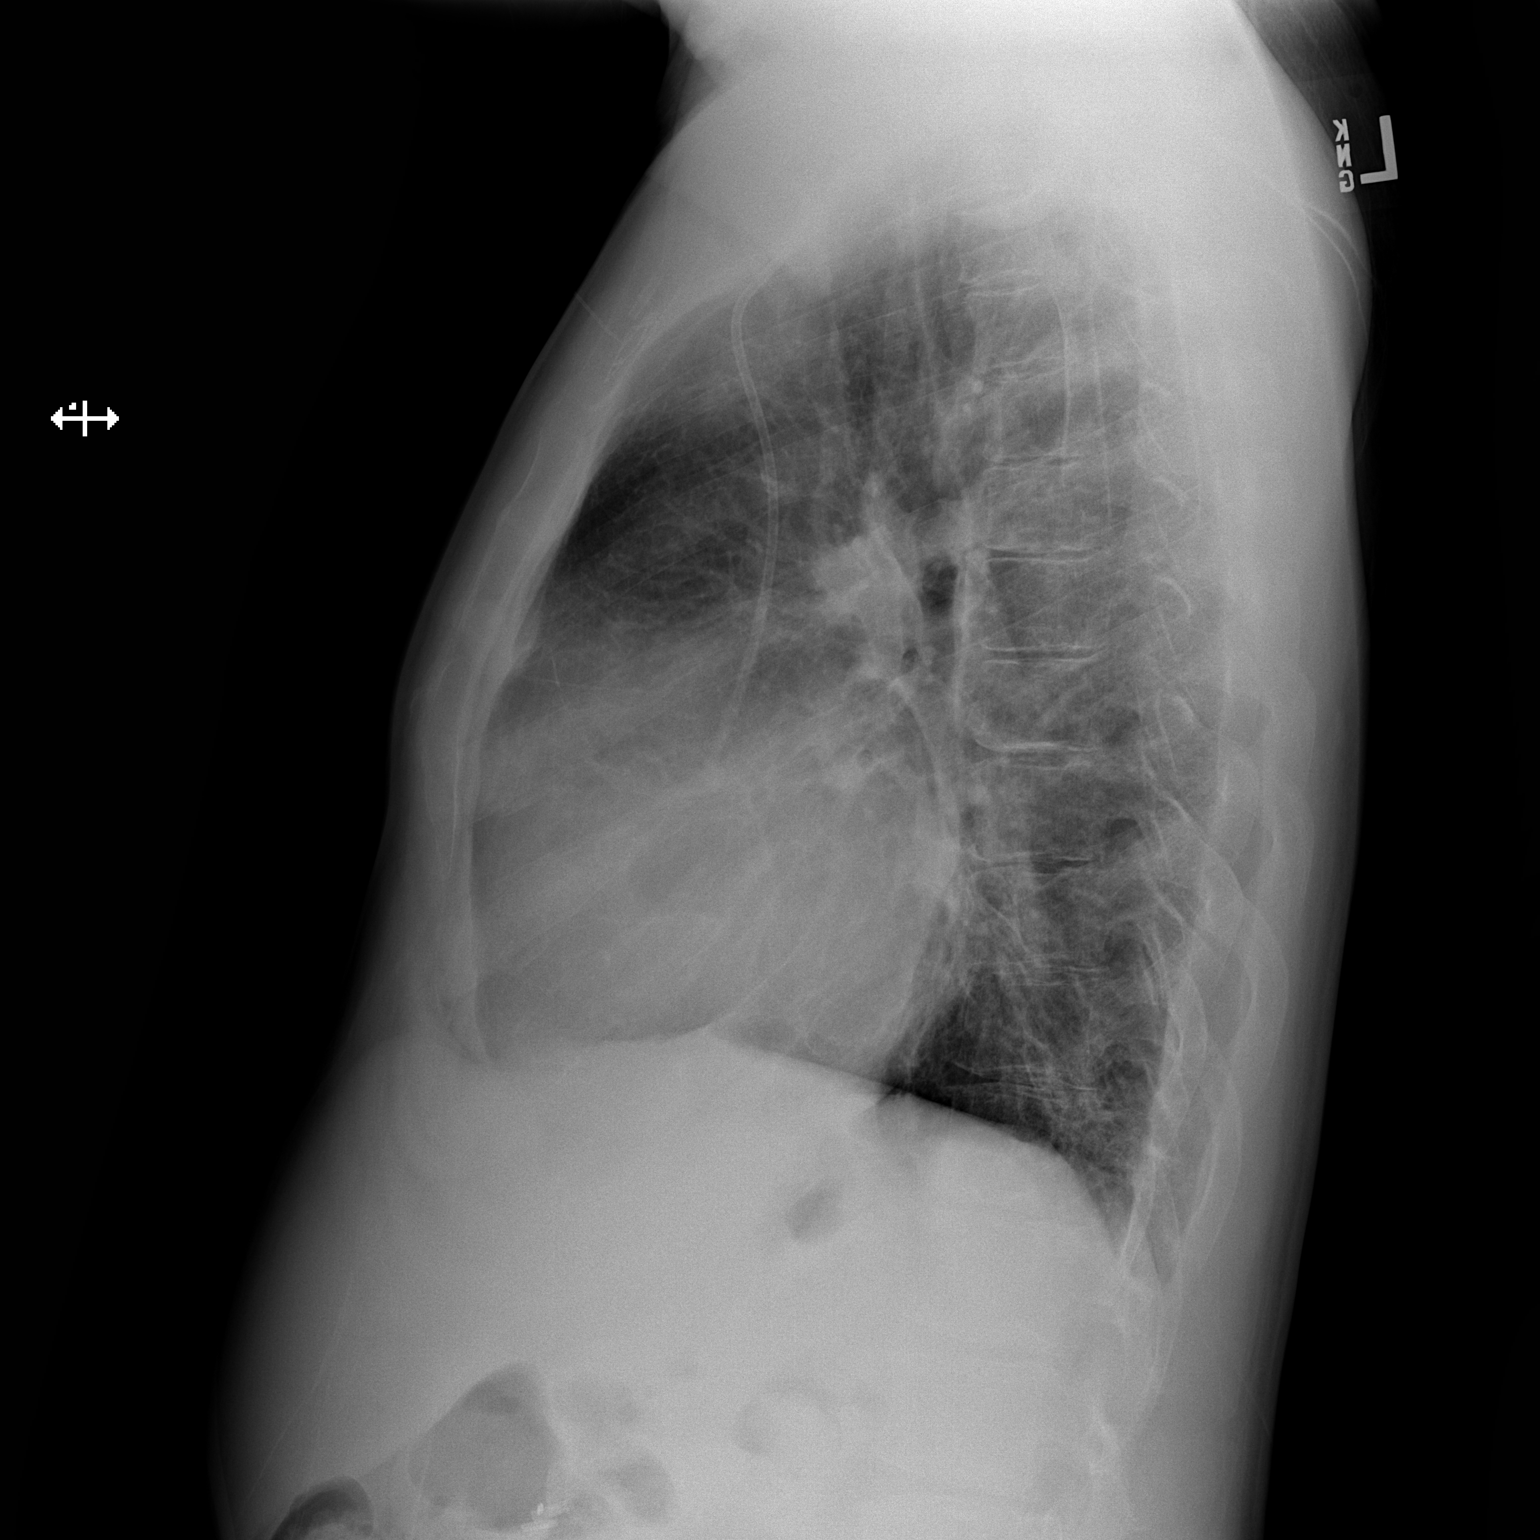

[2 of 2 positions shown; findings below may reference images not displayed]

FINDINGS: Cardiac silhouette is moderately enlarged and unchanged. Mediastinal
silhouette is nonsuspicious. Pulmonary vascular congestion without
pleural effusion or focal consolidation. RIGHT mid and lower lung
zone atelectasis/scarring. No pneumothorax. Single lumen LEFT chest
Port-A-Cath. Surgical clips in the included right abdomen compatible
with cholecystectomy. Osseous structures are nonacute. Focal
sclerosis in the humeral head compatible with infarcts.
IMPRESSION: Stable cardiomegaly and pulmonary vascular congestion.

RIGHT atelectasis/scarring.

## 2019-01-04 IMAGING — DX DG CHEST 1V PORT
1 series · 1 of 1 positions shown · non-contrast
Comparison: 01/31/2016.

CLINICAL DATA: Sickle cell anemia.  Hypoxia.

EXAM:
PORTABLE CHEST 1 VIEW

[chest ap]
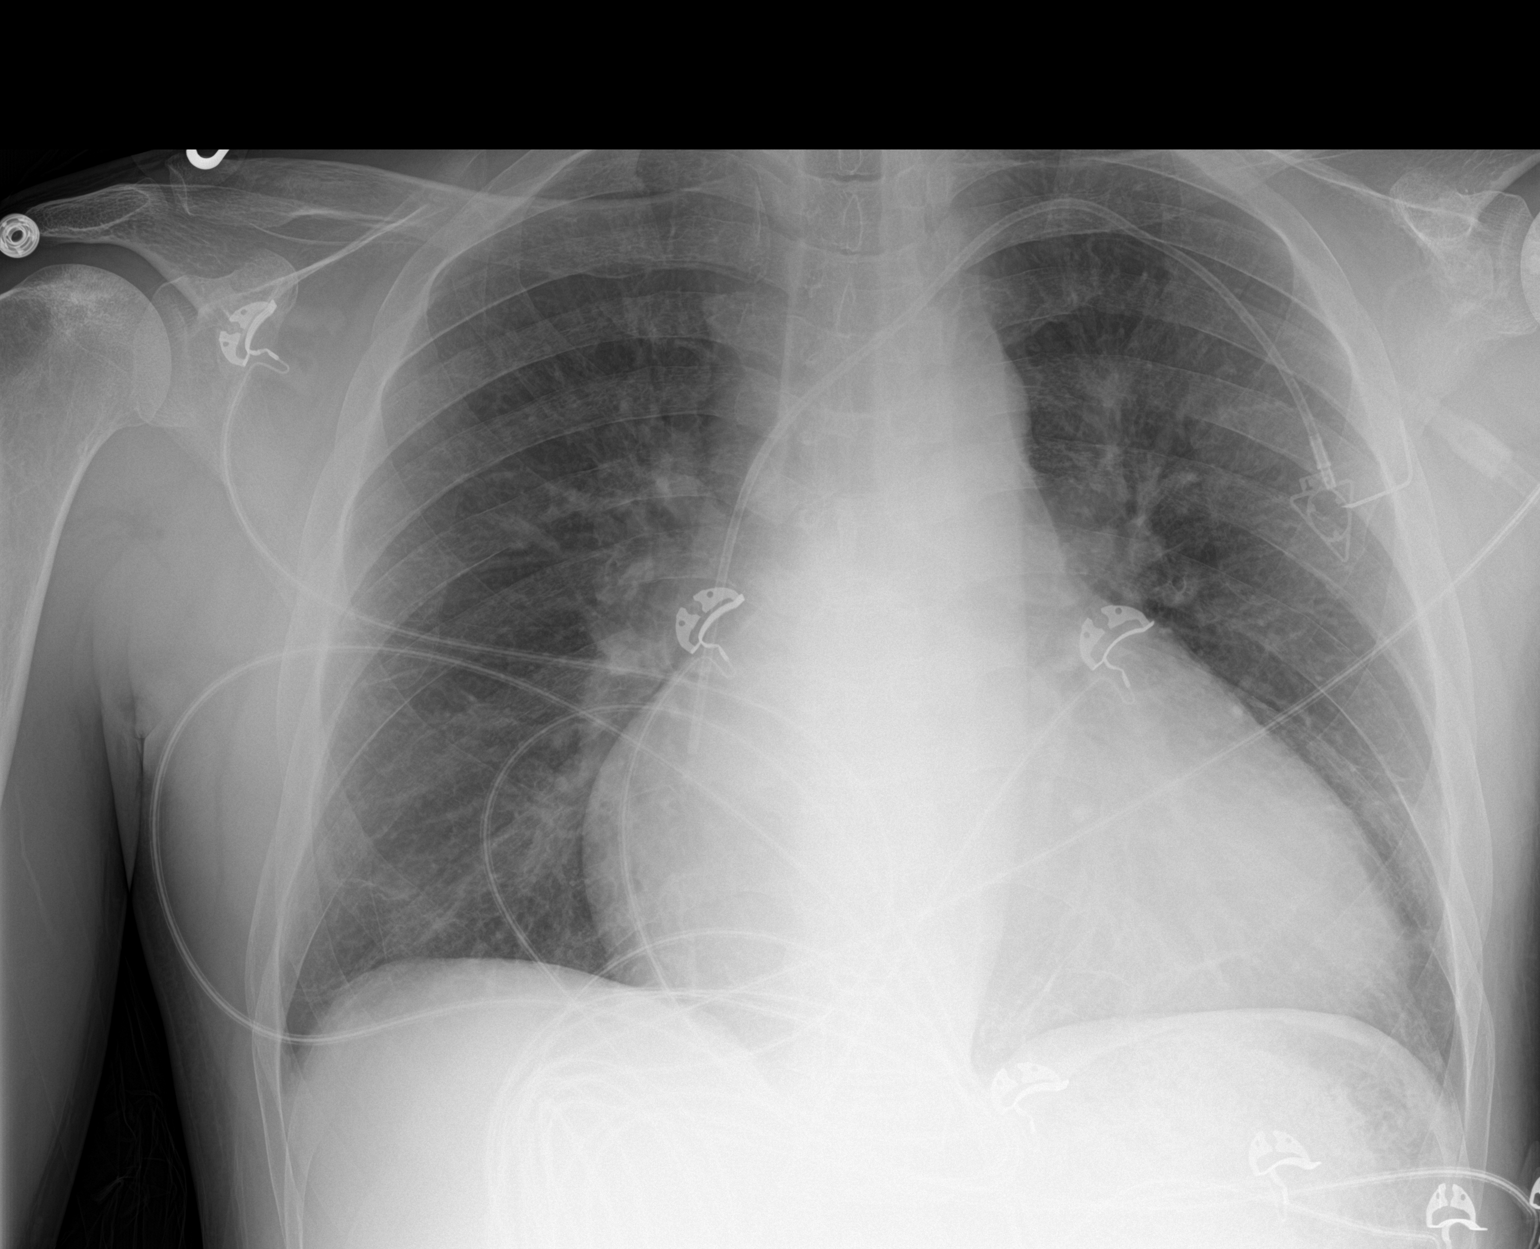

[1 of 1 positions shown; findings below may reference images not displayed]

FINDINGS: PowerPort catheter with lead tip projected over right atrium.
Cardiomegaly with normal pulmonary vascularity. No focal infiltrate.
No pleural effusion or pneumothorax.
IMPRESSION: 1. PowerPort catheter noted with lead tip projected over right
atrium.

2. Cardiomegaly with pulmonary vascular prominence and bilateral
interstitial prominence consistent with mild congestive heart
failure.

## 2019-01-12 IMAGING — CR DG CHEST 2V
2 series · 2 of 2 positions shown · non-contrast
Comparison: 02/11/2016

CLINICAL DATA: Sickle cell pain crisis, worsening bilat lower chest
pain since yesterday

EXAM:
CHEST  2 VIEW

[w chest pa]
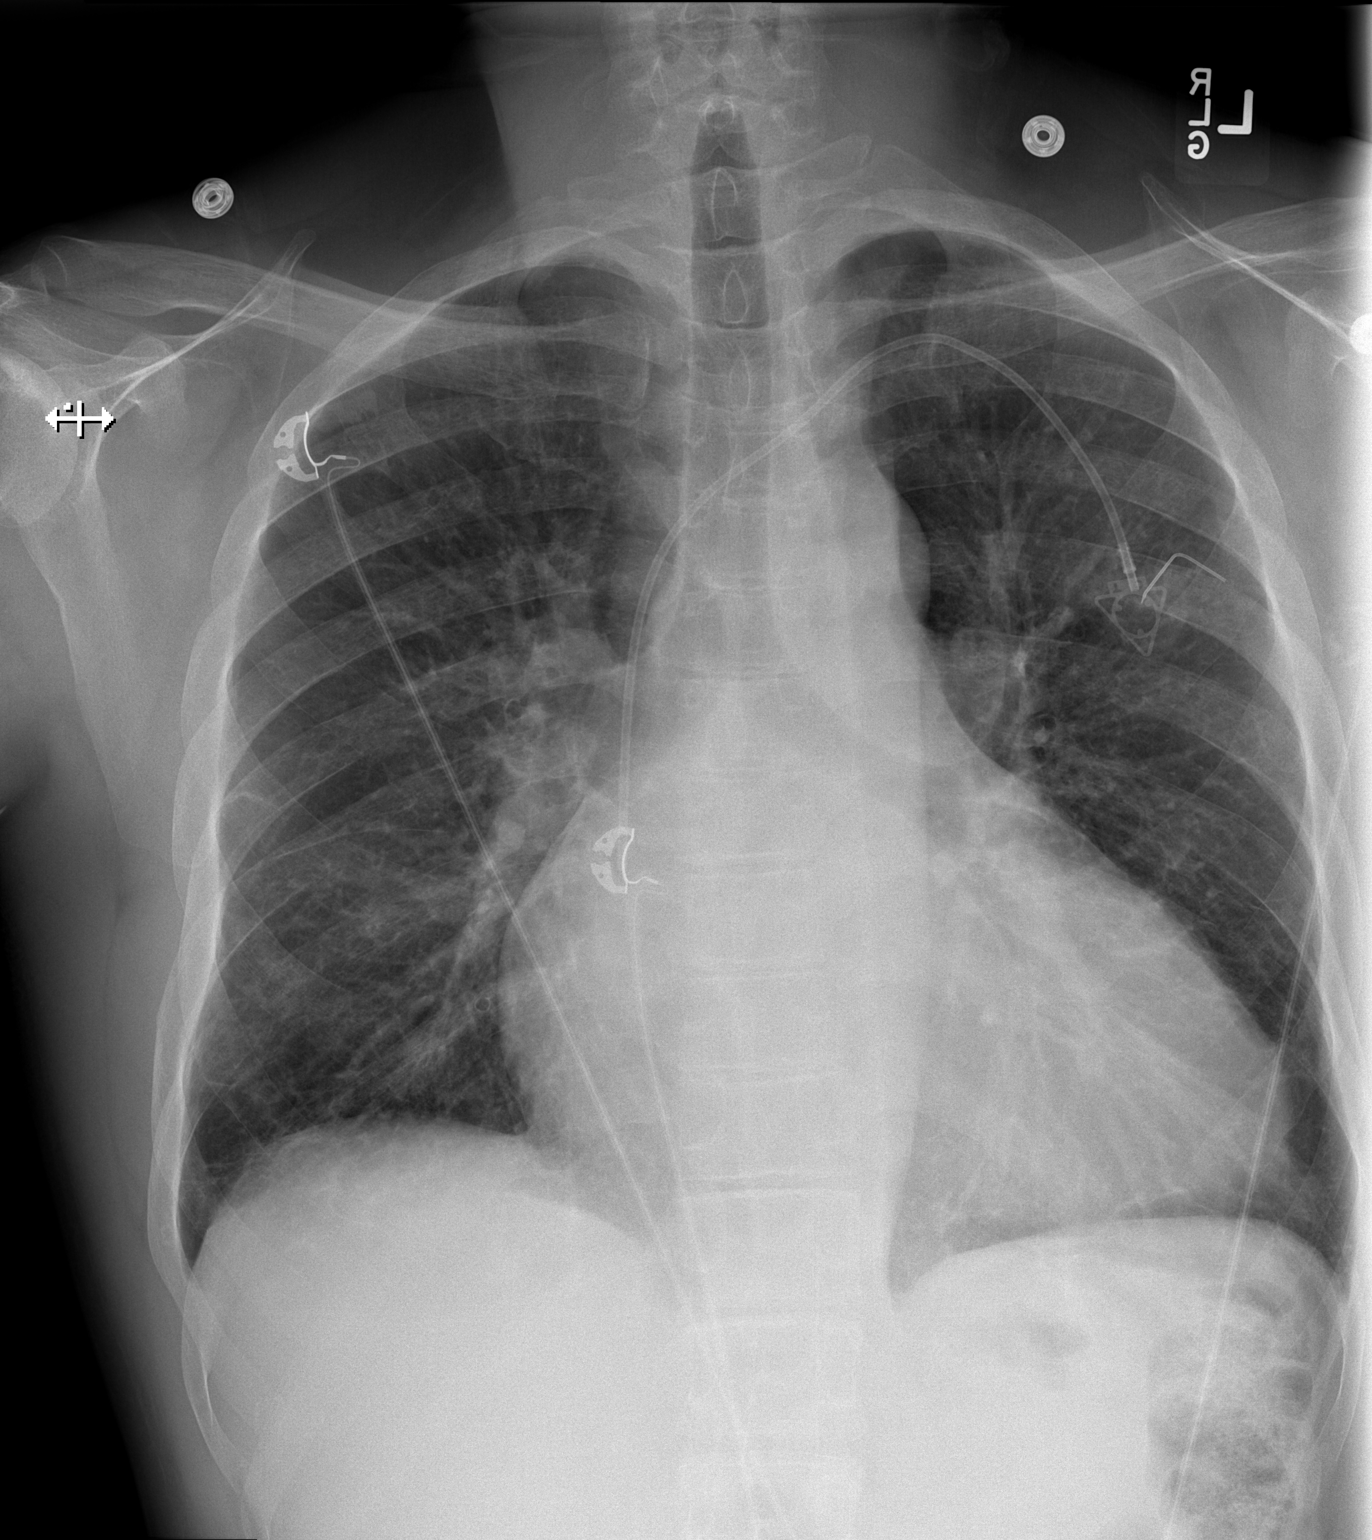

[w chest lat]
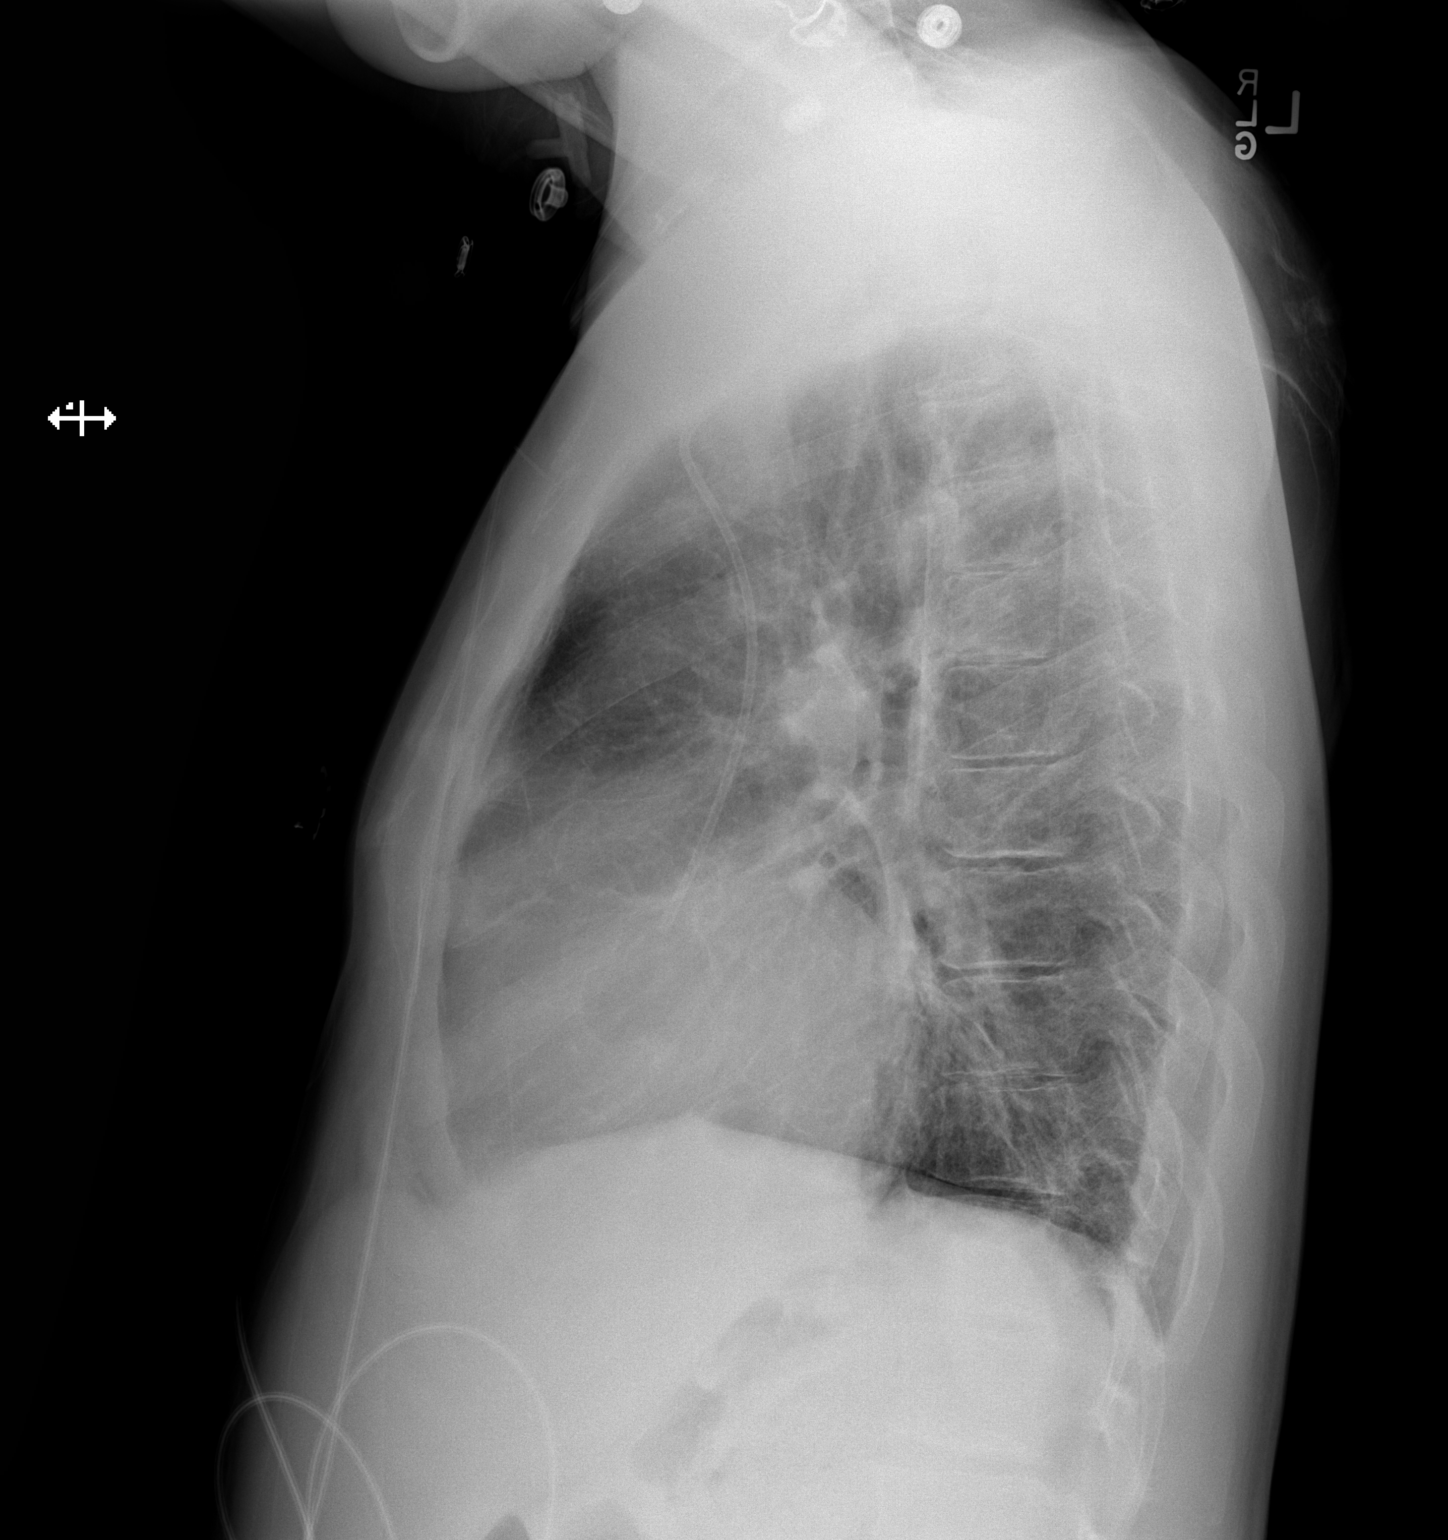

[2 of 2 positions shown; findings below may reference images not displayed]

FINDINGS: Left Port-A-Cath is unchanged in position. Midline trachea. Moderate
cardiomegaly. Mediastinal contours otherwise within normal limits.
No pleural effusion or pneumothorax. No congestive failure. Lower
lobe predominant pulmonary interstitial thickening is chronic and
nonspecific. No lobar consolidation.
IMPRESSION: Cardiomegaly with chronic interstitial thickening. No acute
findings.

## 2019-01-14 IMAGING — CT CT ANGIO CHEST
2 of 6 series · 18 of 36 positions shown · IV contrast (isovue)
Comparison: 01/05/2016, 11/10/2015

CLINICAL DATA: Sickle cell disease.  Chest pain, bilateral rib pain

EXAM:
CT ANGIOGRAPHY CHEST WITH CONTRAST
TECHNIQUE: Multidetector CT imaging of the chest was performed using the
standard protocol during bolus administration of intravenous
contrast. Multiplanar CT image reconstructions and MIPs were
obtained to evaluate the vascular anatomy.
CONTRAST:  100 mL Isovue 370

[Series 6: thins for pacs · axial · 0.74mm/px · z∈[-304,-46]mm · 17 of 288 slices shown]
[im 15/288  lung]
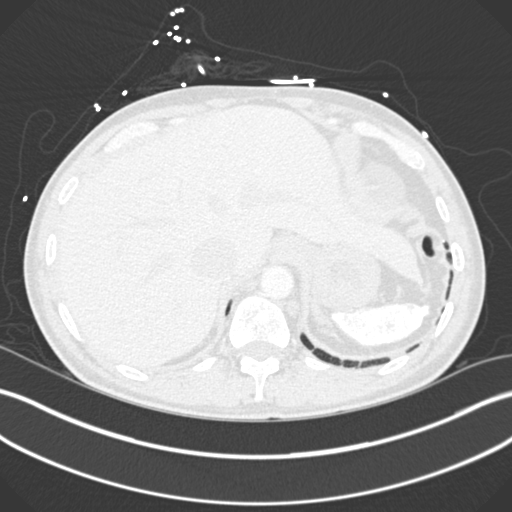
[im 29/288  mediastinal]
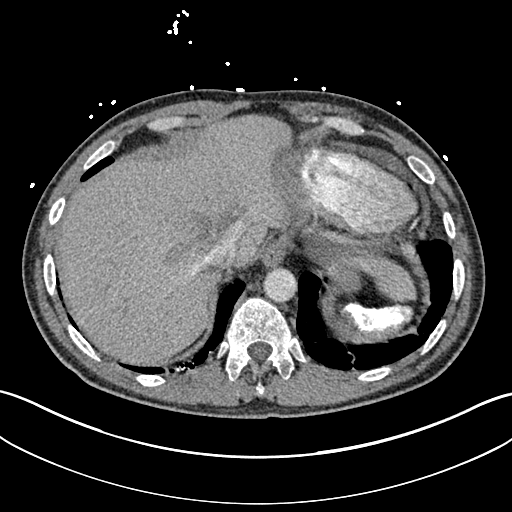
[im 44/288  lung]
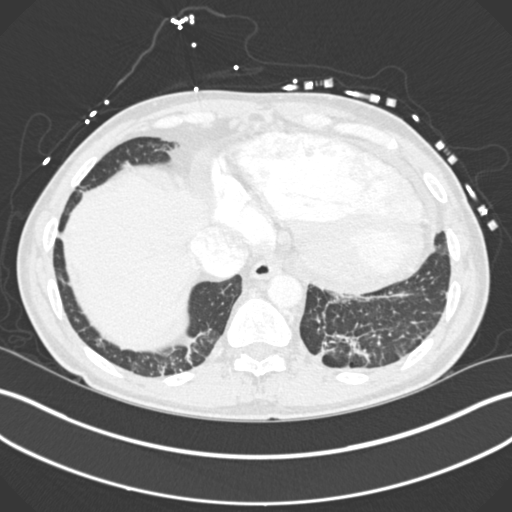
[im 58/288  mediastinal]
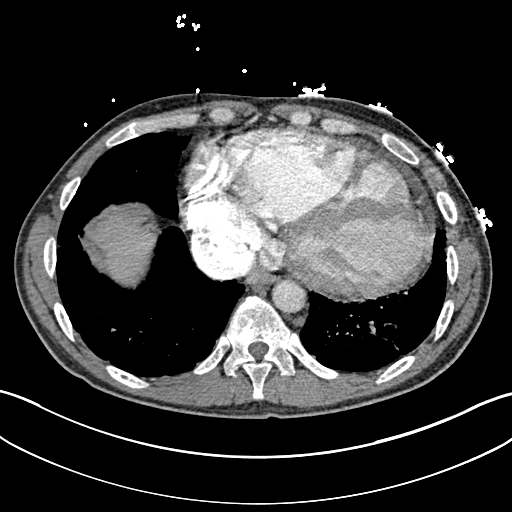
[im 87/288  lung]
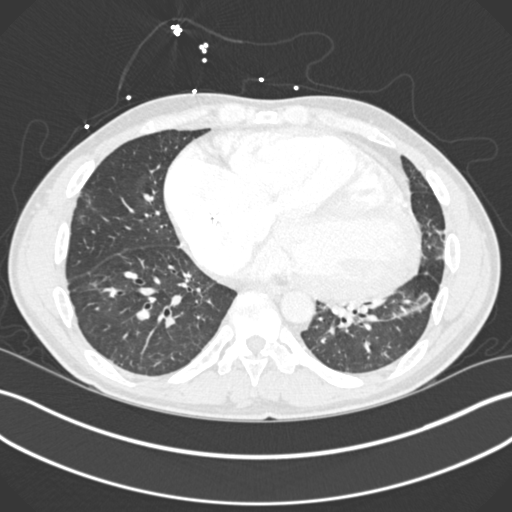
[im 101/288  mediastinal]
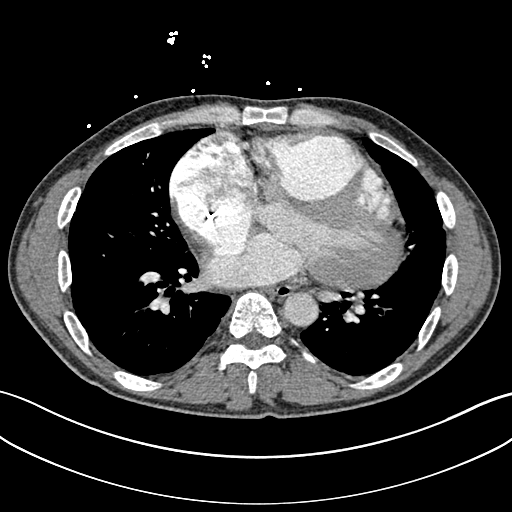
[im 115/288  lung]
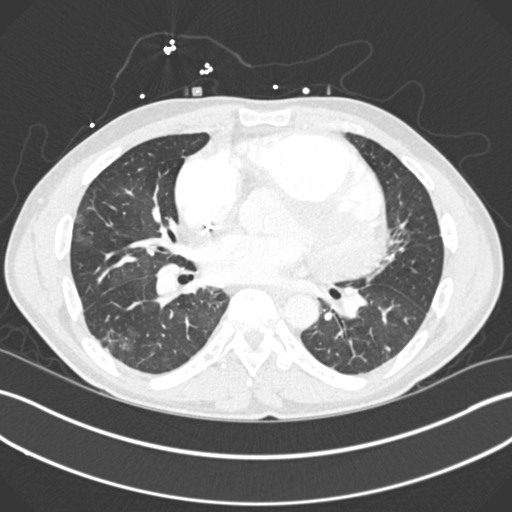
[im 130/288  mediastinal]
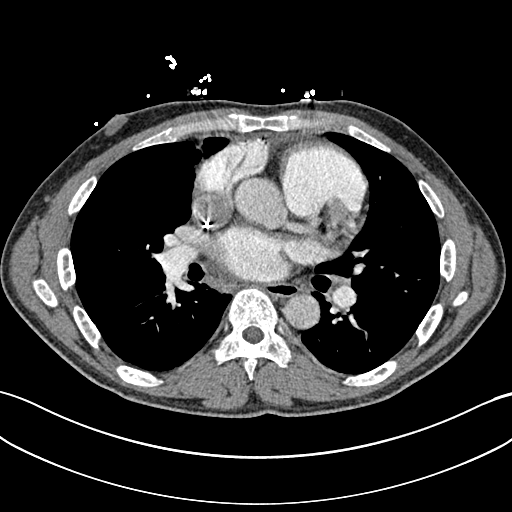
[im 144/288  lung]
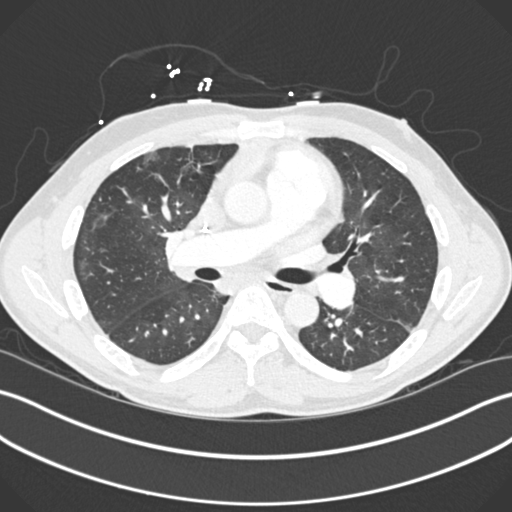
[im 158/288  mediastinal]
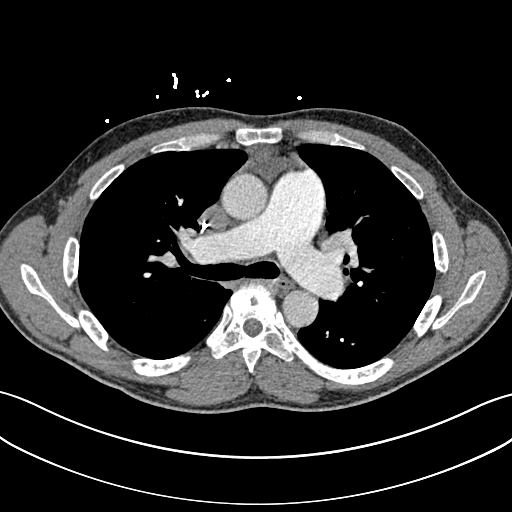
[im 173/288  lung]
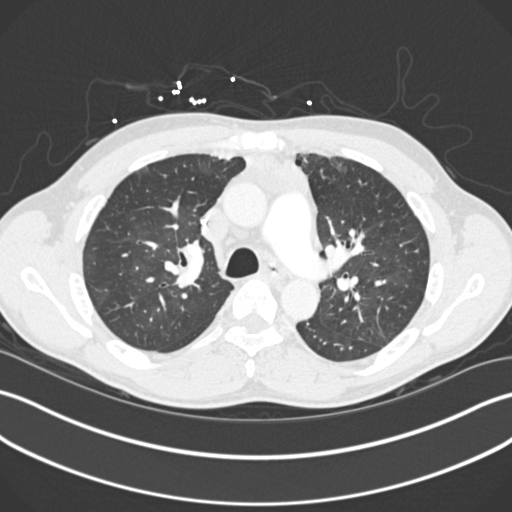
[im 187/288  mediastinal]
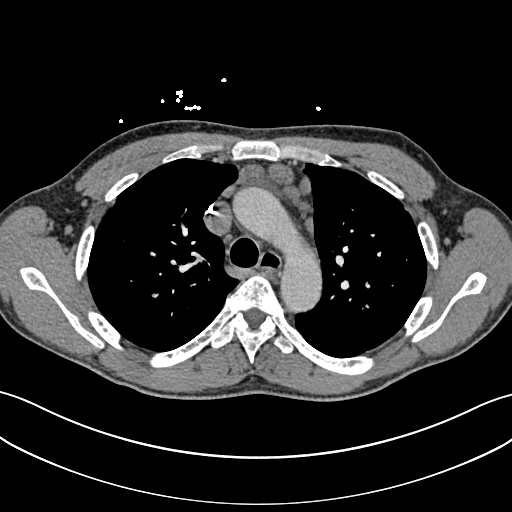
[im 201/288  lung]
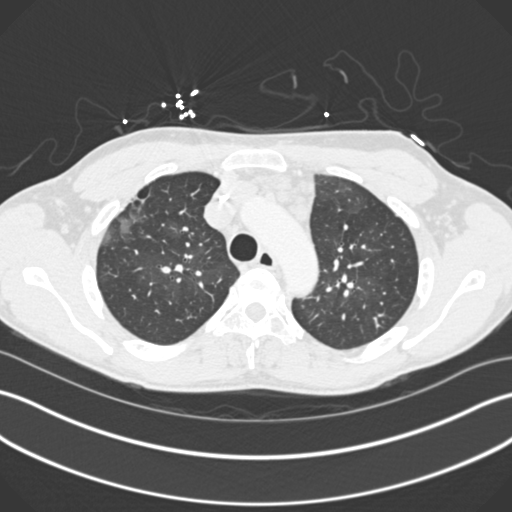
[im 230/288  mediastinal]
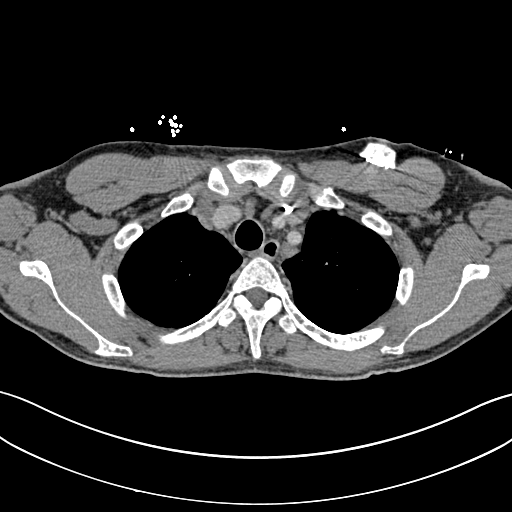
[im 244/288  lung]
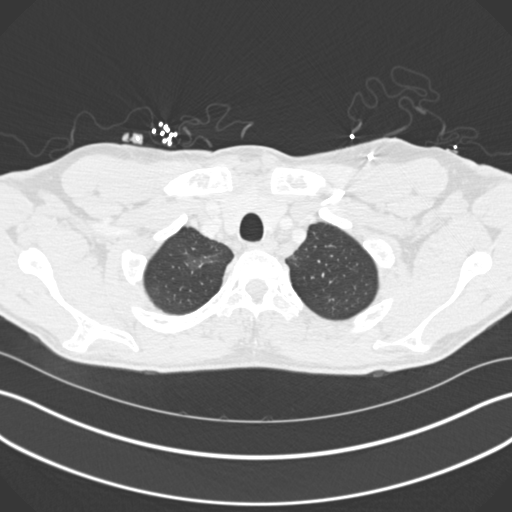
[im 259/288  mediastinal]
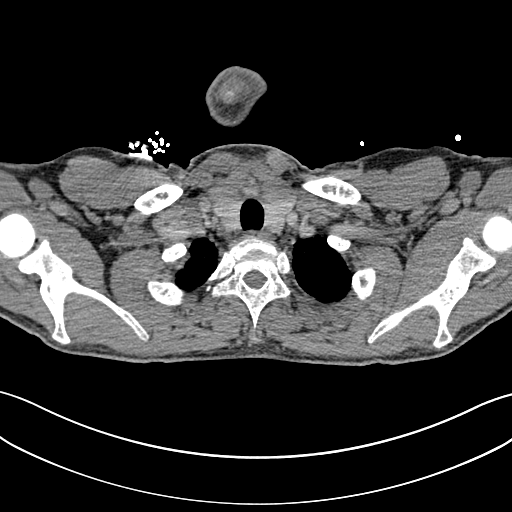
[im 273/288  lung]
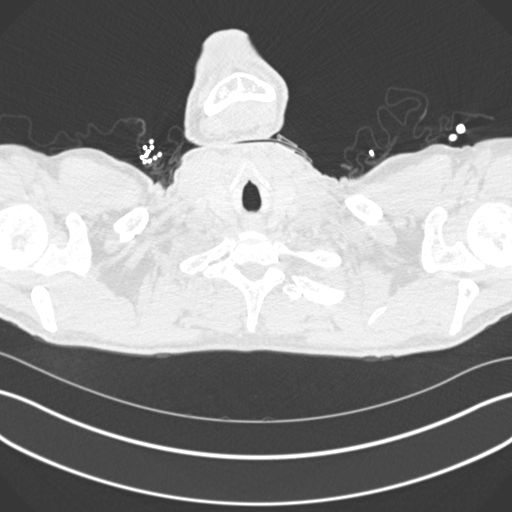

[Series 8: coronal mpr · coronal · 0.57mm/px · 1 of 117 slices shown]
[im 59/117  mediastinal]
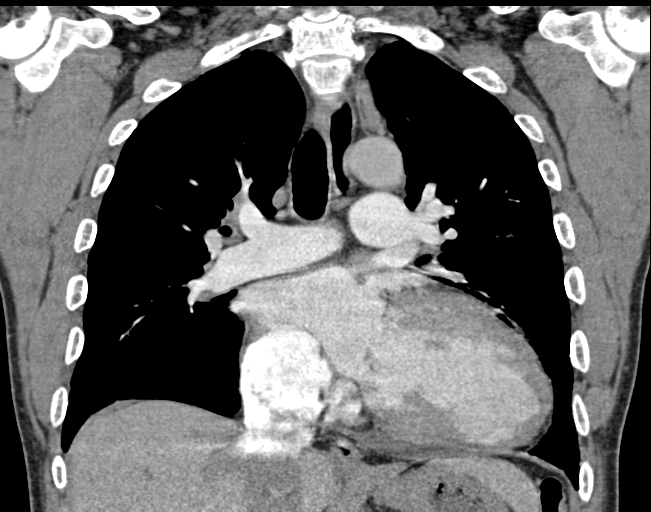

[18 of 36 positions shown; findings below may reference images not displayed]

FINDINGS: Cardiovascular: Satisfactory opacification of the central and lobar
pulmonary arteries with somewhat suboptimal opacification of the
subsegmental pulmonary arteries. Chronic bilateral lower lobe
subsegmental occlusive pulmonary emboli. No acute pulmonary embolus.
Main pulmonary artery is enlarged measuring 4.2 cm in diameter.
Right heart strain (RV/ LV equals 1.0). Severe cardiomegaly. Trace
pericardial effusion. Thoracic aorta is normal in caliber. No
thoracic aortic dissection or aneurysm.

Mediastinum/Nodes: Mild mediastinal lymphadenopathy is unchanged
compared with multiple prior examinations. Left-sided Port-A-Cath
with the tip at the cavoatrial junction. Normal esophagus. Normal
thyroid.

Lungs/Pleura: No pleural effusion or pneumothorax. Bilateral patchy
areas of ground-glass opacity in the upper and lower lobes both
central and peripheral distribution similar in appearance to the
prior exam. No focal consolidation, pleural effusion or
pneumothorax.

Upper Abdomen: Involuted and calcified spleen consistent with
history of sickle cell disease. No acute upper abdominal
abnormality.

Musculoskeletal: No acute osseous abnormality. Subchondral
serpiginous sclerosis in the humeral heads bilaterally consistent
with AVN.

Review of the MIP images confirms the above findings.
IMPRESSION: 1. No acute pulmonary embolus. Chronic bilateral lower lobe
subsegmental pulmonary emboli unchanged in the prior exam. Chronic
right heart strain with dilatation of the main pulmonary artery as
can be seen with pulmonary arterial hypertension.
2. Stable cardiomegaly.
3. Patchy ground-glass opacities bilaterally suggesting mild
pulmonary edema versus an infectious or inflammatory etiology.
4. Stable mediastinal lymphadenopathy.

## 2019-01-14 IMAGING — CR DG CHEST 2V
2 series · 2 of 2 positions shown · non-contrast
Comparison: 02/19/2016

CLINICAL DATA: New onset chest pain beginning today.

EXAM:
CHEST  2 VIEW

[w chest pa]
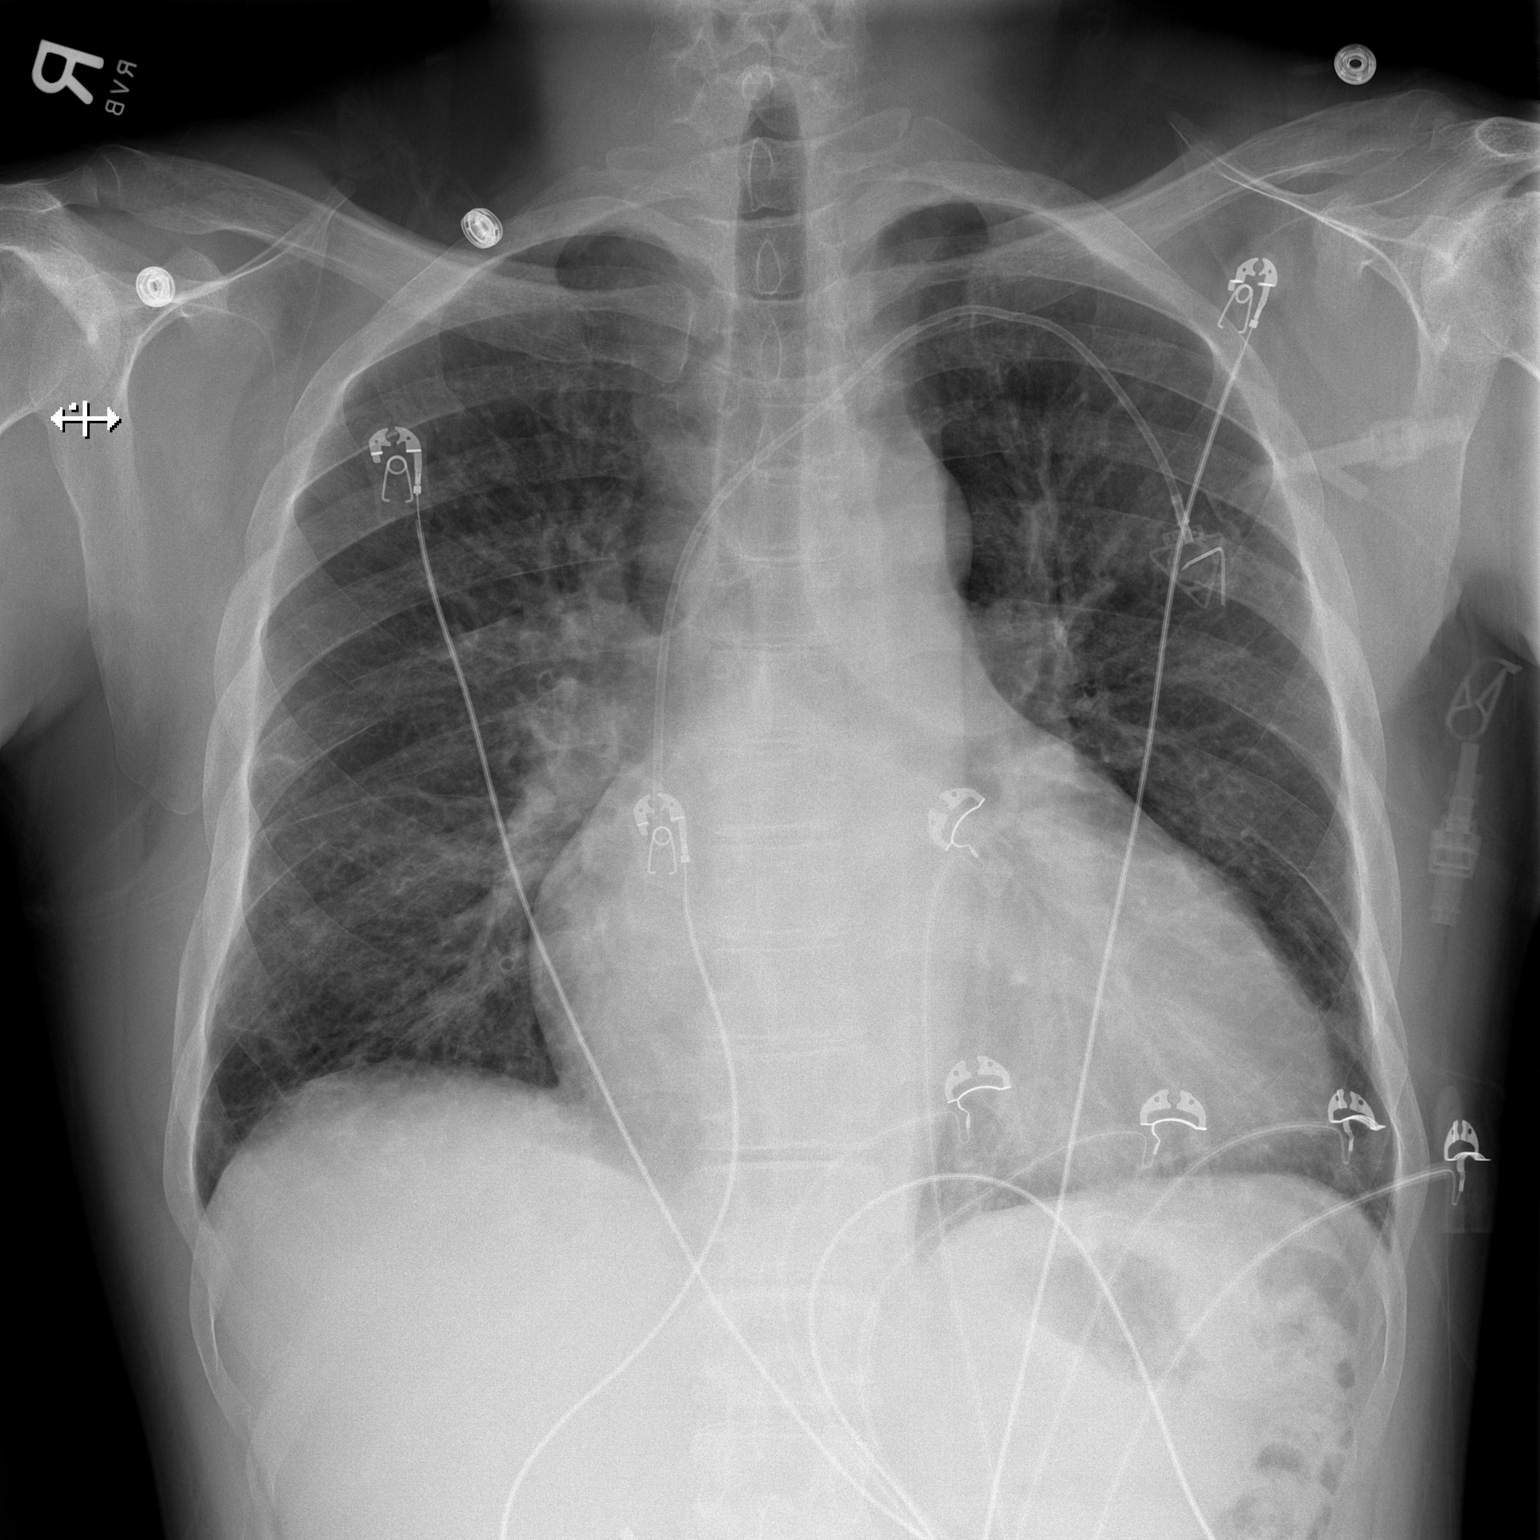

[w chest lat]
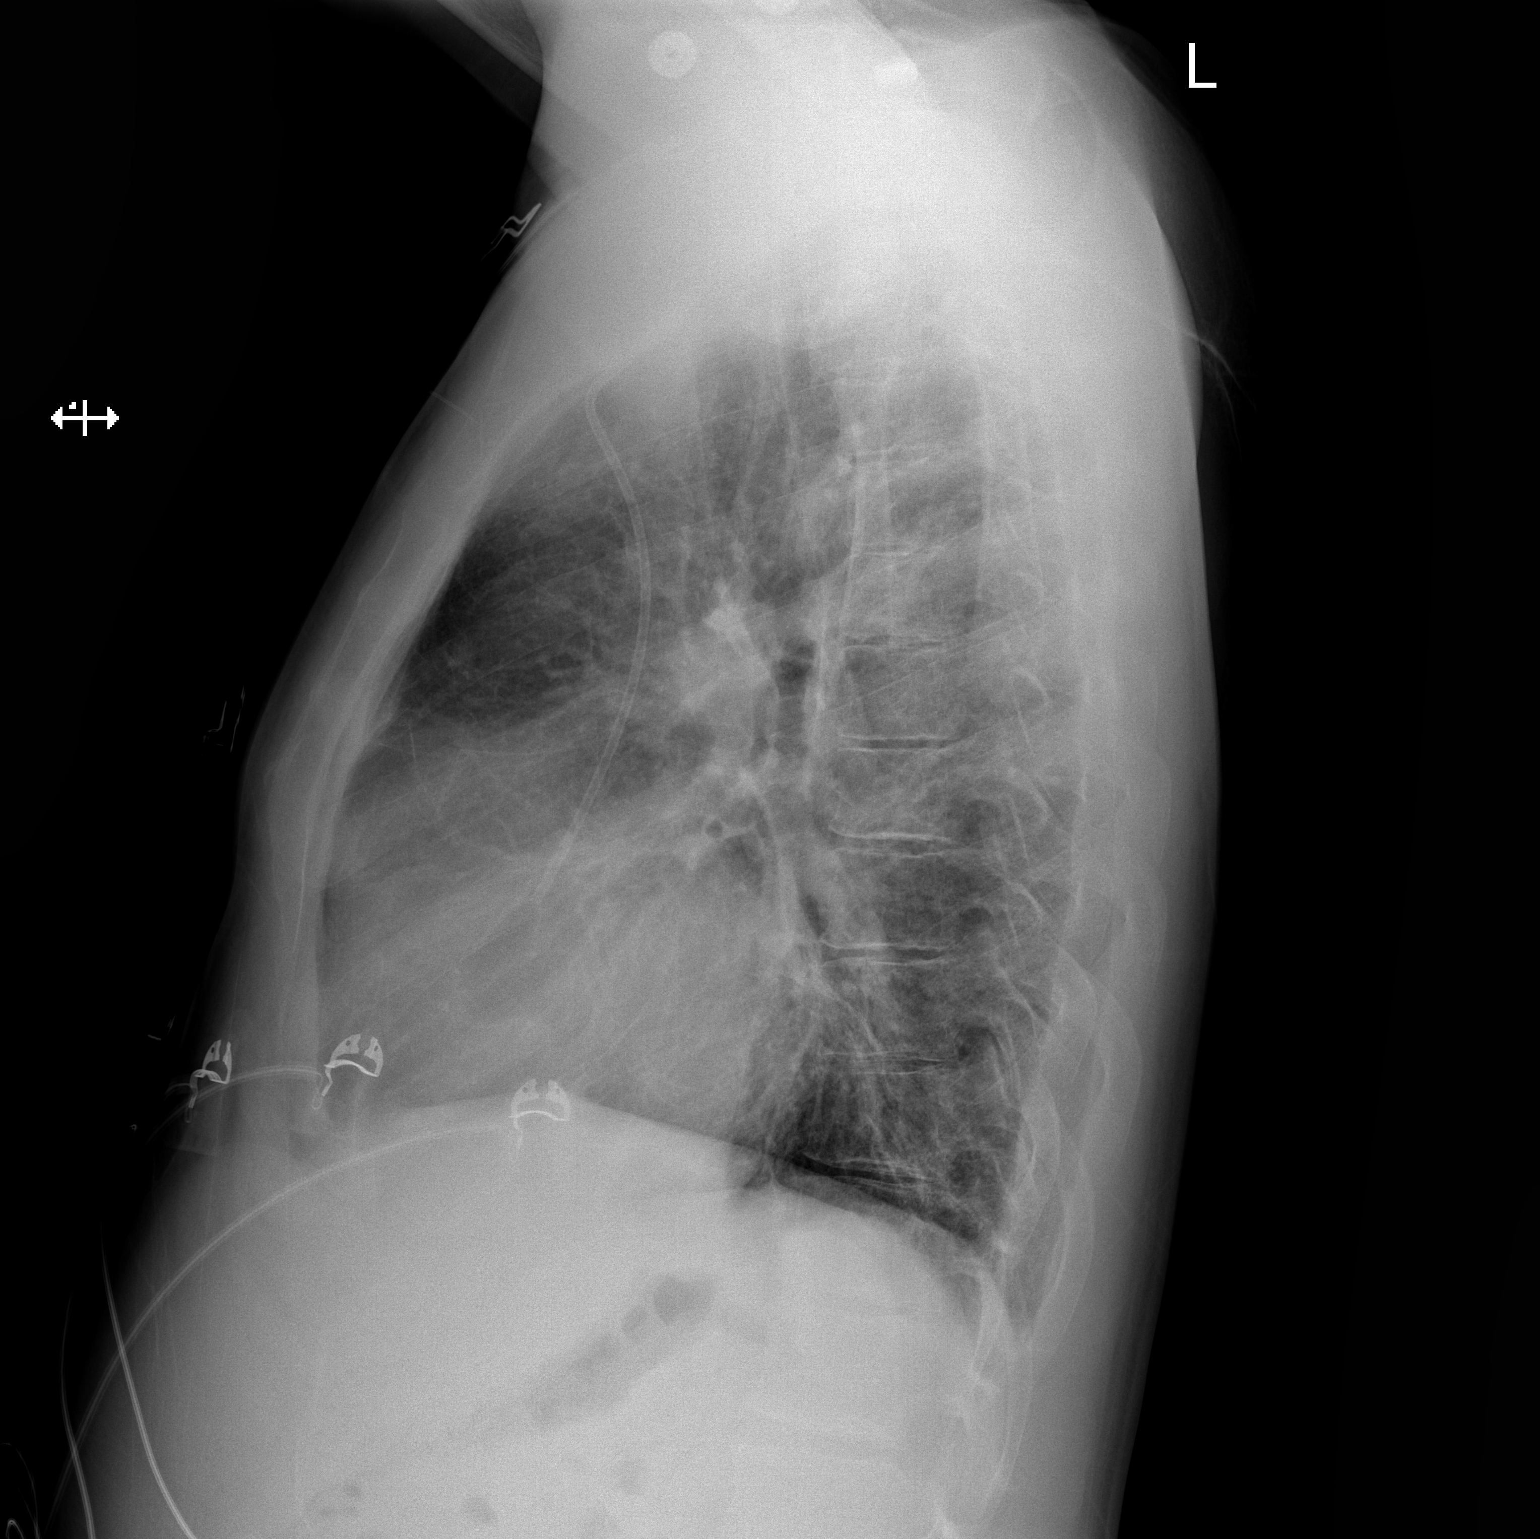

[2 of 2 positions shown; findings below may reference images not displayed]

FINDINGS: Chronic enlargement of the cardiac silhouette. Power port tip in the
SVC just above the right atrium as seen previously. Pulmonary venous
hypertension, possibly with early interstitial edema. No advanced
edema. No consolidation or collapse. No effusions.
IMPRESSION: Chronic cardiomegaly. Worsening of the pulmonary vascular pattern
suggesting venous hypertension with early interstitial edema.

## 2019-01-16 IMAGING — CR DG CHEST 2V
2 series · 2 of 2 positions shown · non-contrast
Comparison: CT 02/21/2016

CLINICAL DATA: SICKLE CELL CRISIS

EXAM:
CHEST  2 VIEW

[w chest lat]
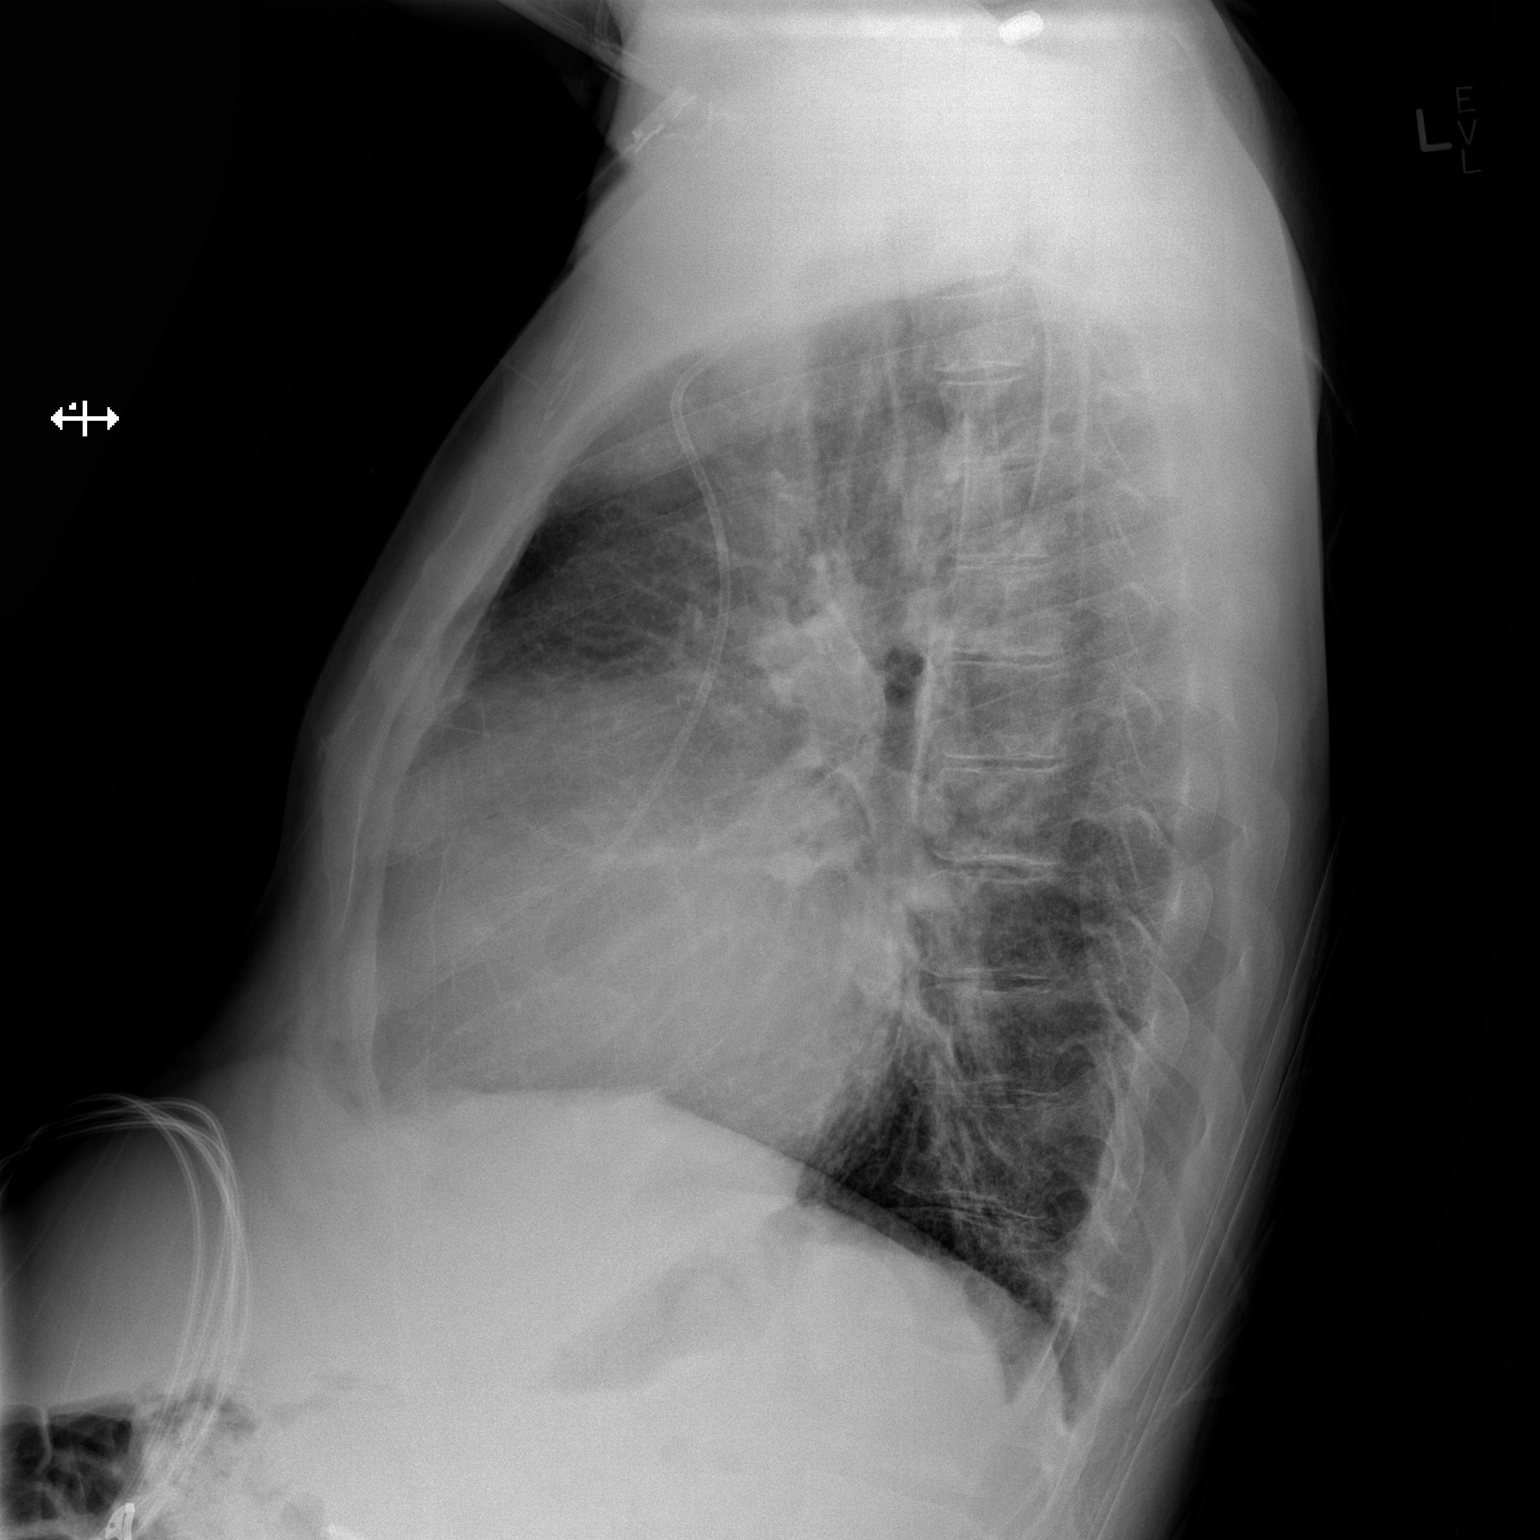

[x chest ap]
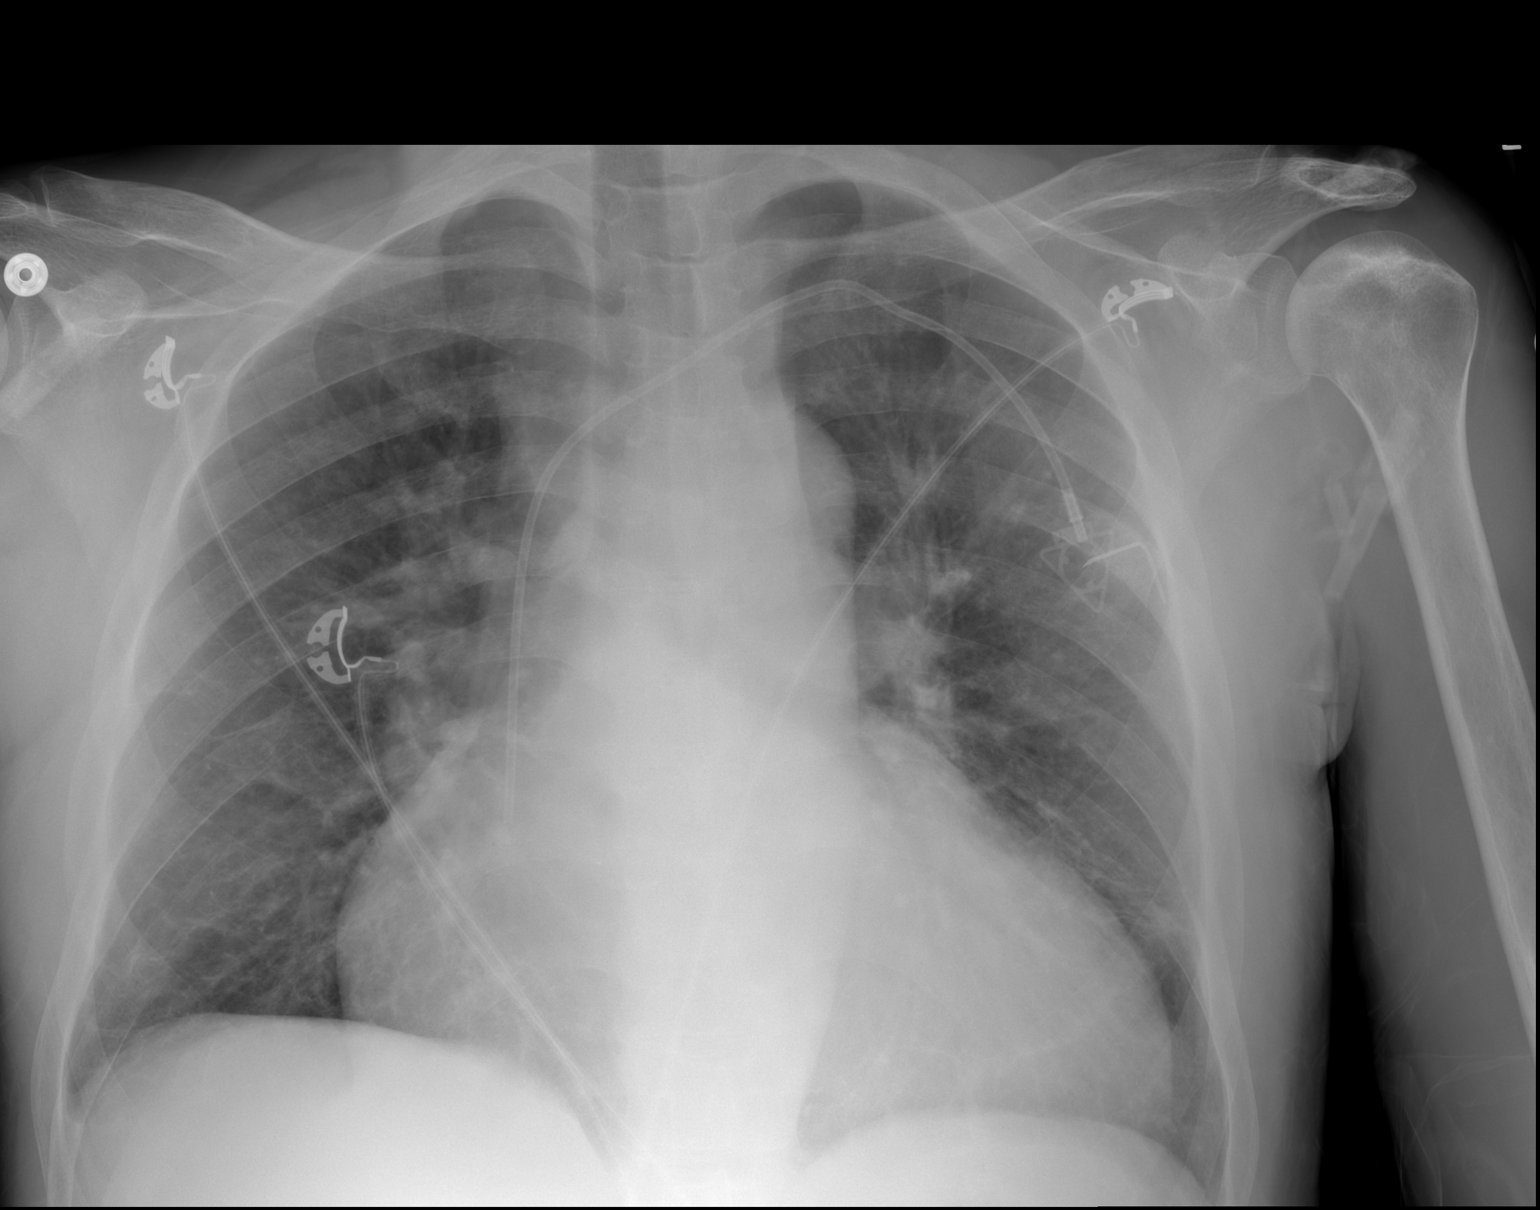

[2 of 2 positions shown; findings below may reference images not displayed]

FINDINGS: LEFT-SIDED POWER PORT UNCHANGED. STABLE CARDIAC SILHOUETTE. THERE IS
MILD CENTRAL VENOUS CONGESTION. NO FOCAL INFILTRATE. NO PLEURAL
FLUID. NO PNEUMOTHORAX.
IMPRESSION: 1. STABLE CARDIOMEGALY.
2. STABLE CENTRAL VENOUS PULMONARY CONGESTION.
3. NO EVIDENCE OF PULMONARY INFILTRATE

## 2019-01-22 IMAGING — DX DG CHEST 2V
2 series · 2 of 2 positions shown · non-contrast
Comparison: 02/23/2016.

CLINICAL DATA: Hypoxia and weakness.  Sickle cell.

EXAM:
CHEST  2 VIEW

[chest pa]
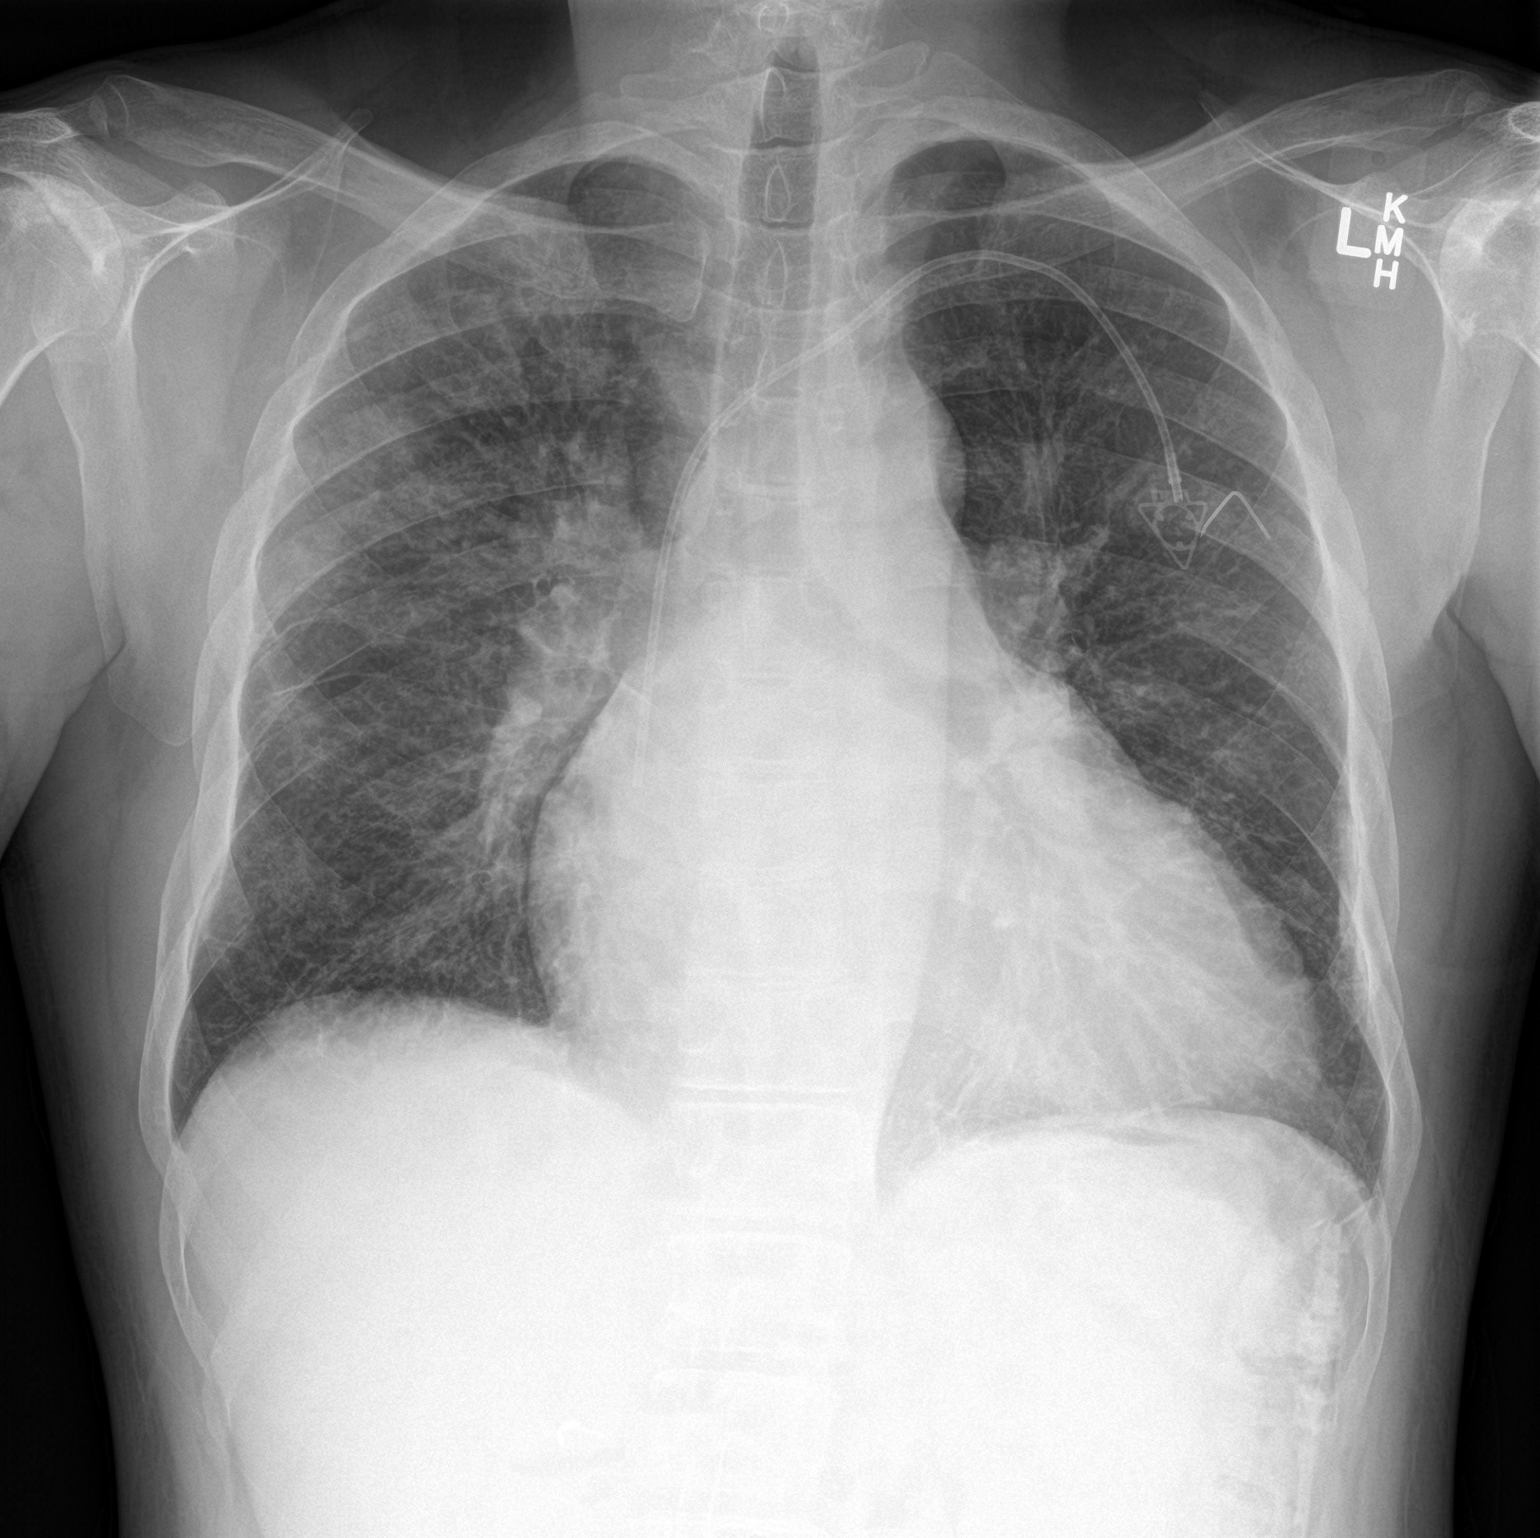

[chest lat]
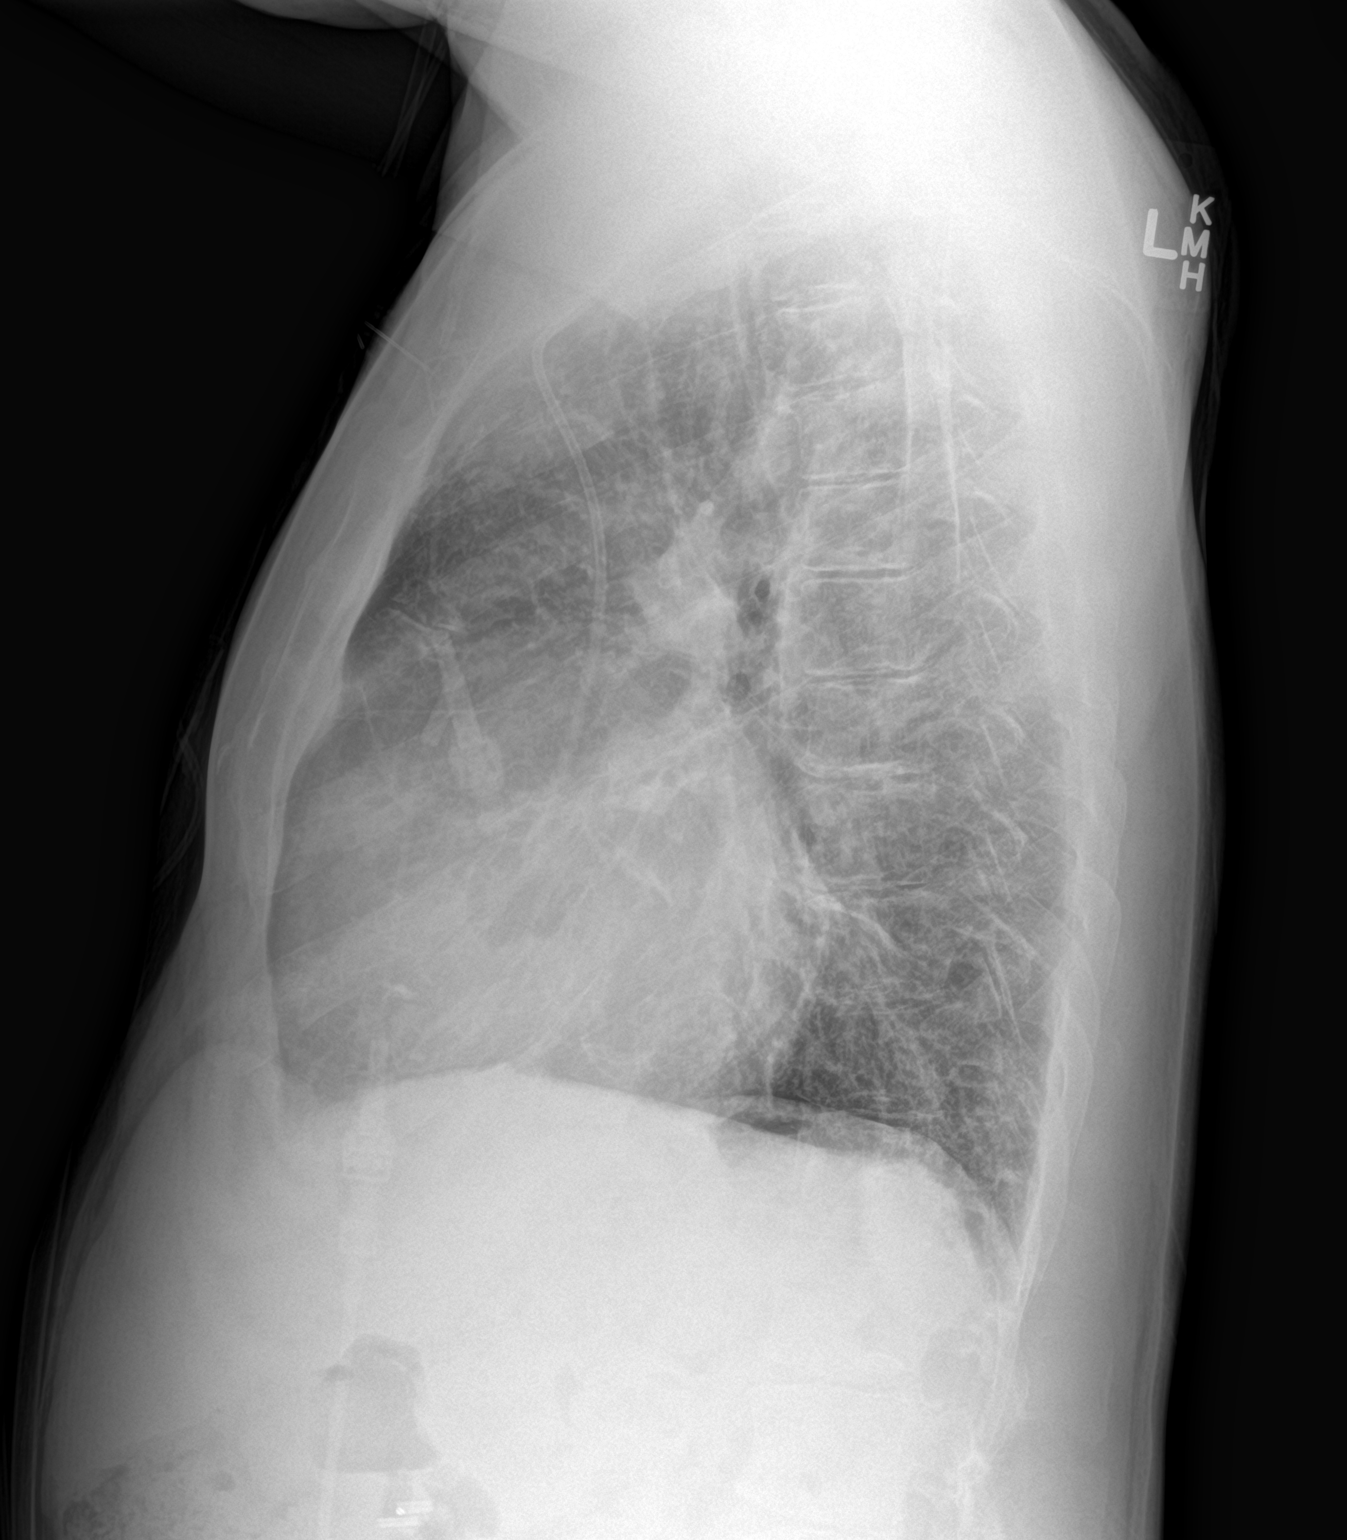

[2 of 2 positions shown; findings below may reference images not displayed]

FINDINGS: Trachea is midline. Left subclavian power port tip is in the high
right atrium. Heart is enlarged, stable. Mild patchy right upper
lobe airspace opacification. Streaky atelectasis or scarring in the
left lower lobe. No pleural fluid.
IMPRESSION: Mild right upper lobe patchy airspace opacification.

## 2019-01-28 IMAGING — CR DG CHEST 2V
2 series · 2 of 2 positions shown · non-contrast
Comparison: 02/29/2016 and earlier.

CLINICAL DATA: 35-year-old male with right rib pain and sickle cell
crisis. Initial encounter.

EXAM:
CHEST  2 VIEW

[w chest pa]
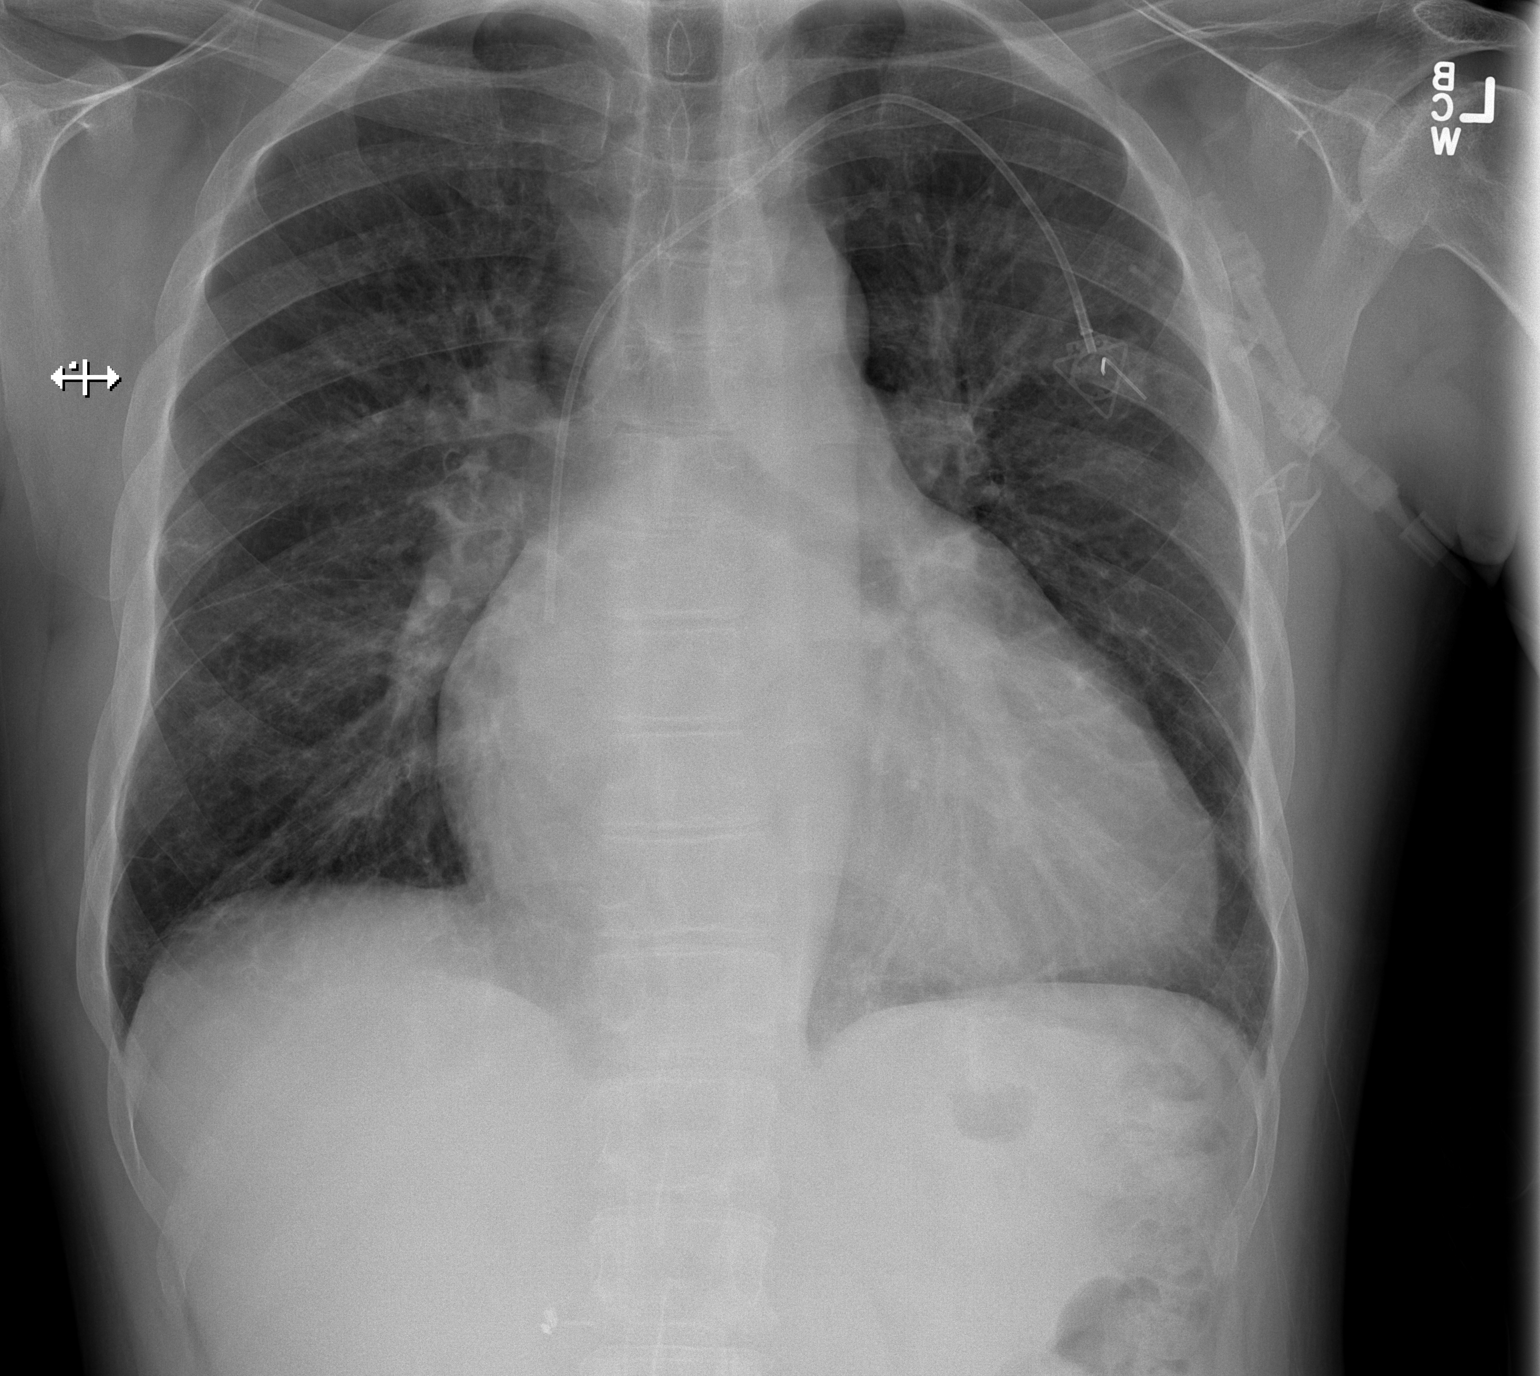

[w chest lat]
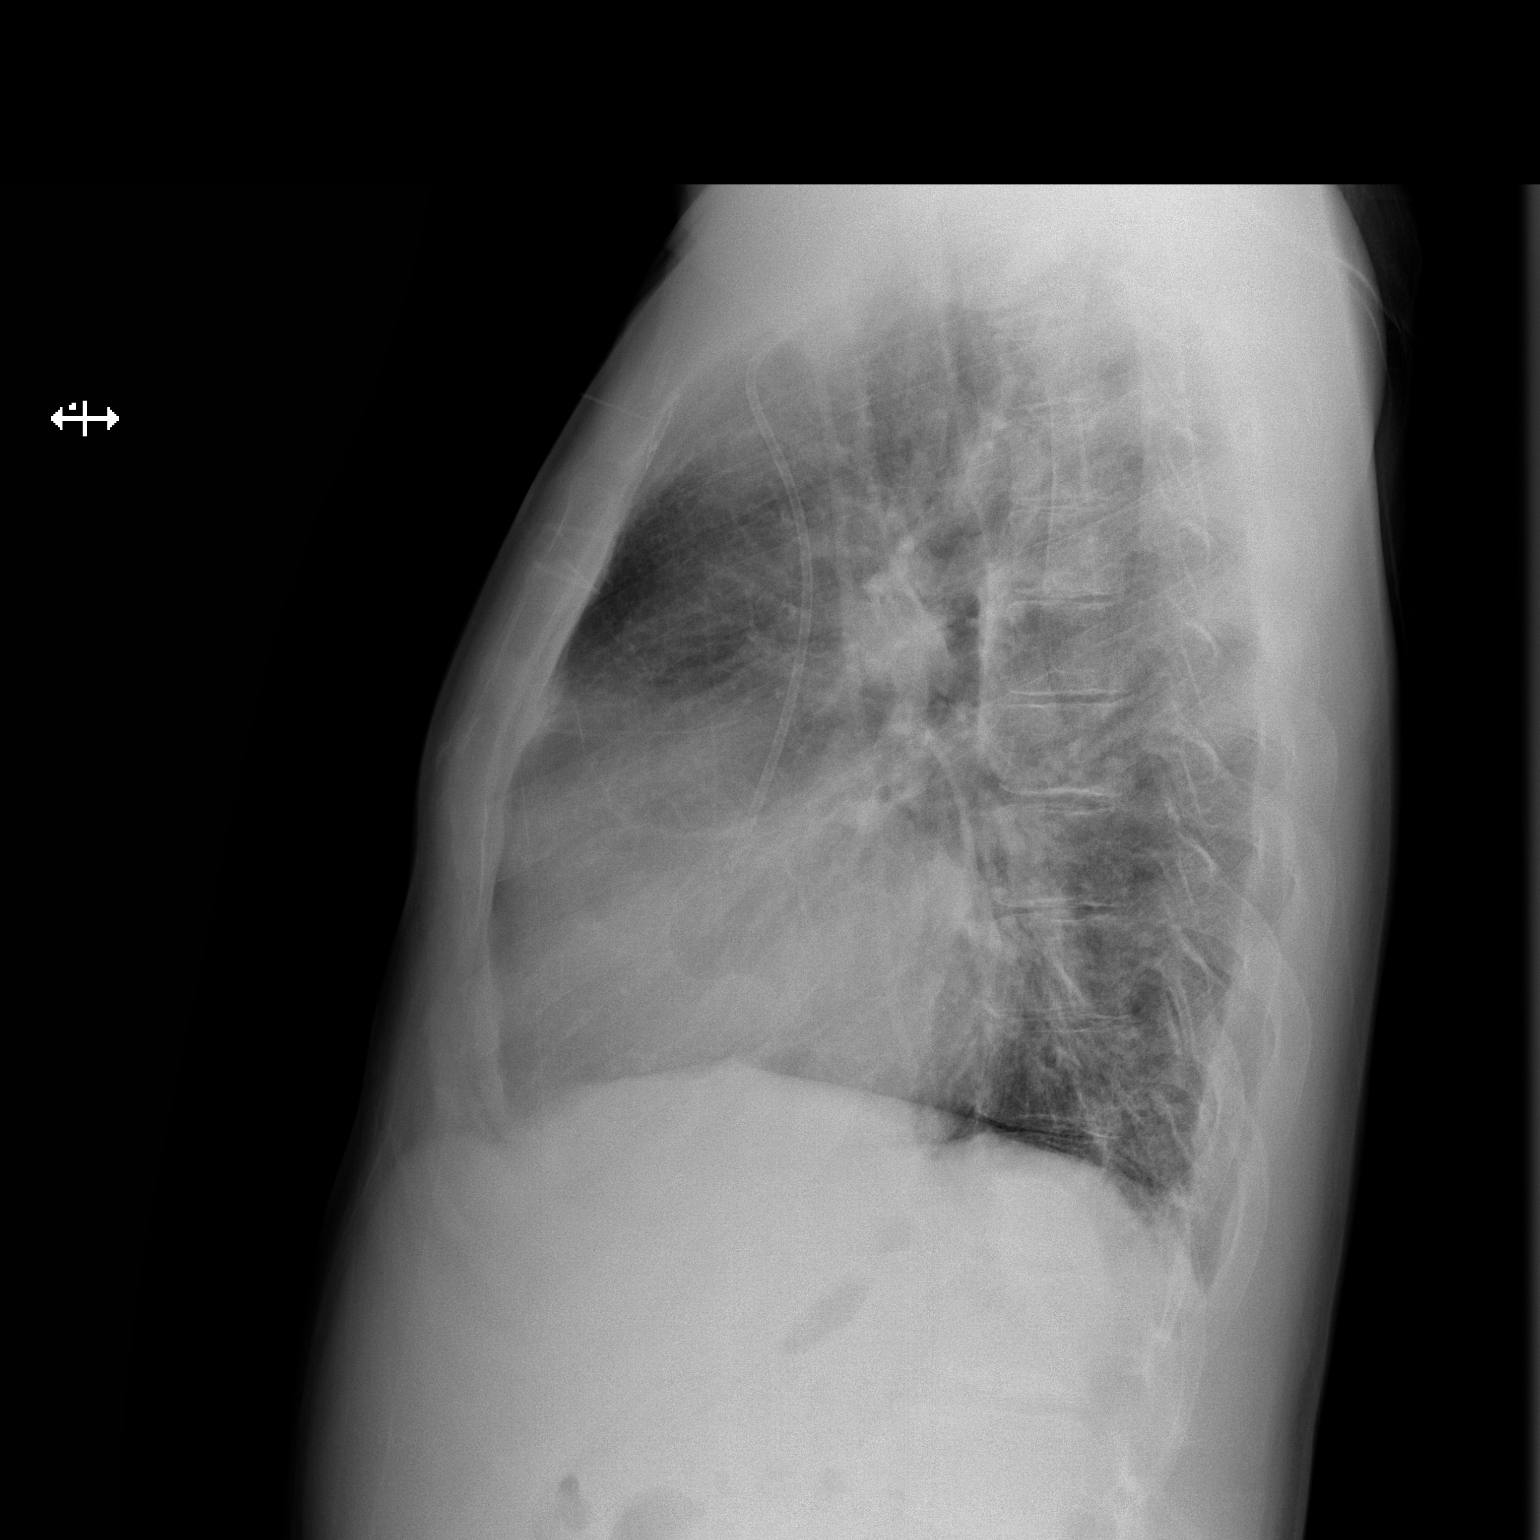

[2 of 2 positions shown; findings below may reference images not displayed]

FINDINGS: Stable cardiomegaly and mediastinal contours. Stable left chest
power port, currently accessed. Interval resolved patchy and nodular
right upper lung opacity. No pneumothorax, pulmonary edema, pleural
effusion or acute pulmonary opacity. Stable basilar predominant mild
chronic increased interstitial markings. Negative visible bowel gas
pattern. Stable cholecystectomy clips. No acute osseous abnormality
identified.
IMPRESSION: Interval resolved right upper lobe opacity. Stable cardiomegaly with
no acute cardiopulmonary abnormality.

## 2019-01-28 IMAGING — CT CT ANGIO CHEST
3 of 25 series · 9 of 40 positions shown · IV contrast (ISOVUE 370)
Comparison: Chest CT angiogram dated 02/21/2016.

CLINICAL DATA: Chest pain, new hypoxia.  Sickle cell disease.

EXAM:
CT ANGIOGRAPHY CHEST WITH CONTRAST
TECHNIQUE: Multidetector CT imaging of the chest was performed using the
standard protocol during bolus administration of intravenous
contrast. Multiplanar CT image reconstructions and MIPs were
obtained to evaluate the vascular anatomy.
CONTRAST:  100 cc Isovue 370
Additional 80 cc Isovue 370 given due to port leakage and patient
motion.

[Series 13: thins for pacs · axial · 0.77mm/px · z∈[-144,-20]mm · 4 of 208 slices shown (1 of 3)]
[im 42/208  lung]
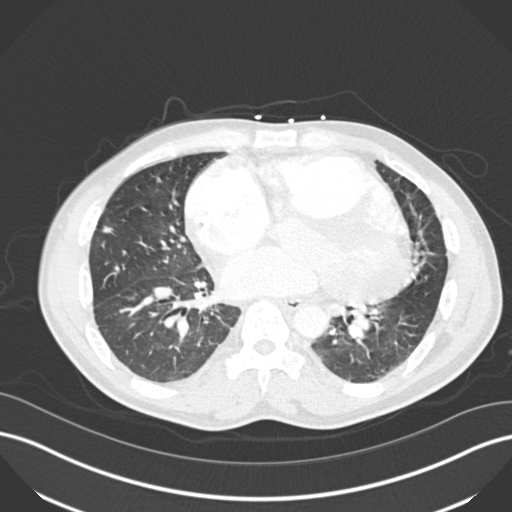
[im 83/208  mediastinal]
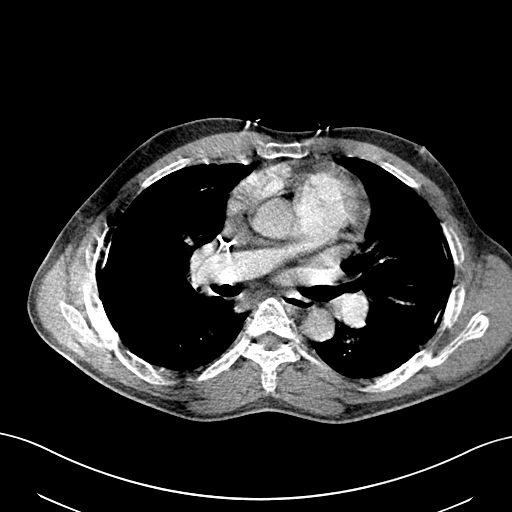
[im 125/208  lung]
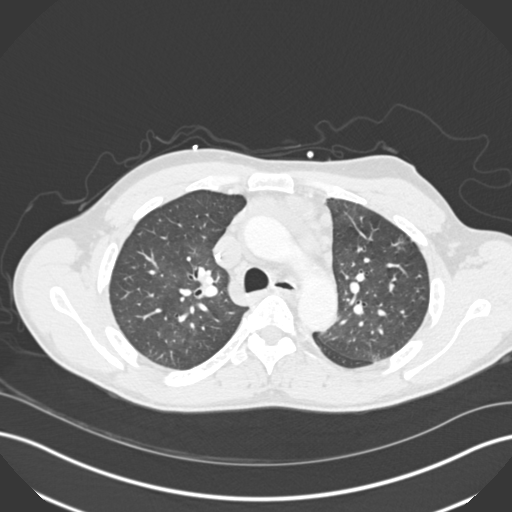
[im 166/208  mediastinal]
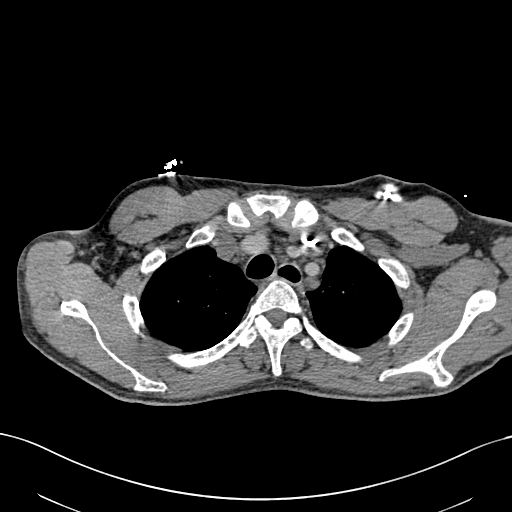

[Series 31: thins for pacs · axial · 0.74mm/px · z∈[-215,-129]mm · 3 of 174 slices shown (2 of 3)]
[im 44/174  lung]
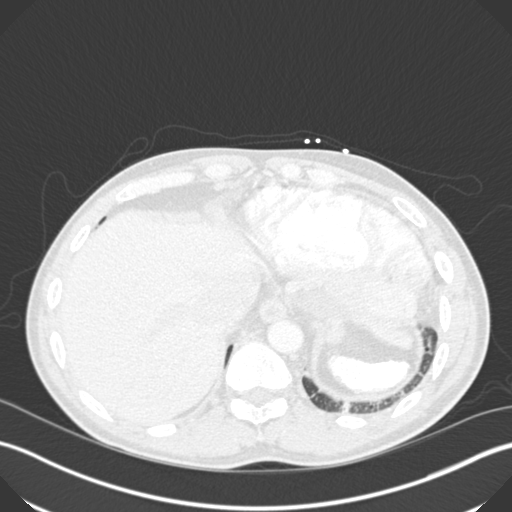
[im 87/174  lung]
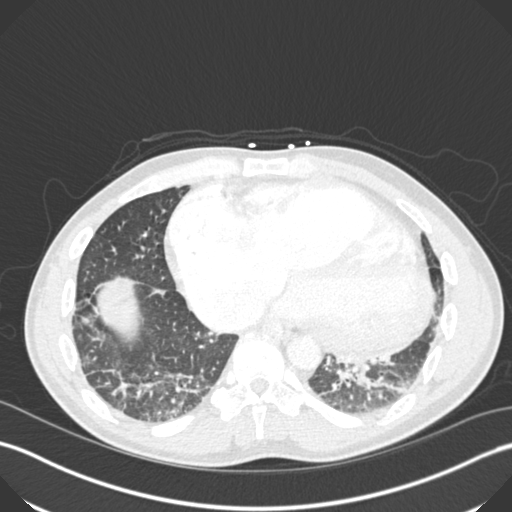
[im 130/174  lung]
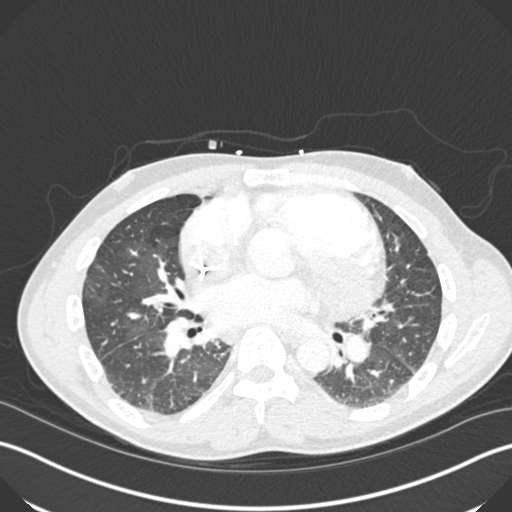

[Series 38: thins for pacs · axial · 0.74mm/px · z∈[-205,-158]mm · 2 of 280 slices shown (3 of 3)]
[im 47/280  lung]
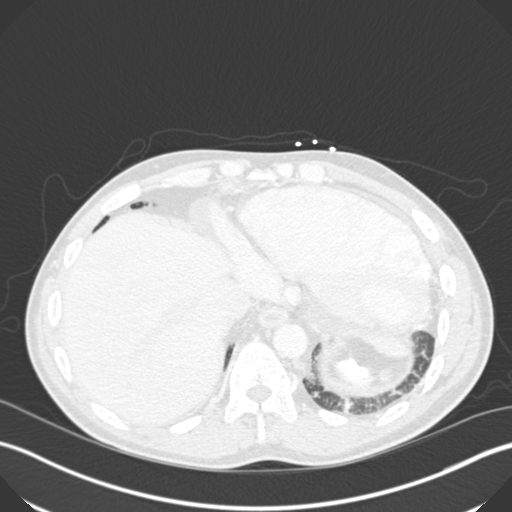
[im 94/280  lung]
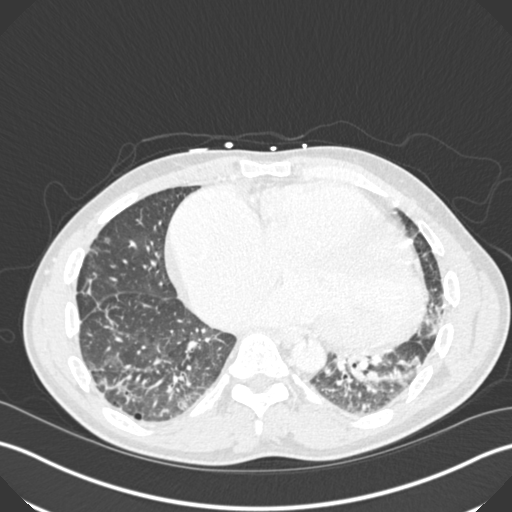

[9 of 40 positions shown; findings below may reference images not displayed]

FINDINGS: Cardiovascular: Despite repeat imaging, the peripheral segmental and
subsegmental pulmonary arteries to the lower lobes are difficult to
definitively characterize due to patient motion artifact and
suboptimal contrast opacification. There is, however, no pulmonary
embolism identified within the main, lobar or central segmental
pulmonary arteries bilaterally.

Patient has had chronic lower lobe subsegmental pulmonary emboli
described on previous exams, not able to be delineated again on
today's study due to the patient motion artifact and suboptimal
contrast opacification.

Thoracic aorta is normal in caliber and configuration. Heart is
prominently enlarged, stable compared to the previous exam. No
pericardial effusion seen.

Mediastinum/Nodes: Numerous small and moderate-sized lymph nodes
within the anterior mediastinum are stable. Normal residual thymic
tissue is also noted within the anterior mediastinum.

Esophagus appears normal. Trachea and central bronchi are
unremarkable.

Lungs/Pleura: Mild bibasilar scarring/atelectasis is stable. Lungs
otherwise clear. No evidence of pneumonia. No pleural effusion or
pneumothorax.

Upper Abdomen: Limited images of the upper abdomen are unremarkable.

Musculoskeletal: No acute or suspicious osseous lesion. Superficial
soft tissues are unremarkable.

Review of the MIP images confirms the above findings.
IMPRESSION: 1. No acute pulmonary embolus identified, with mild study
limitations detailed above.
2. Stable cardiomegaly.
3. No pneumonia or pulmonary edema. Stable mild scarring/fibrosis at
the lung bases.
4. Stable mediastinal lymphadenopathy.

## 2019-02-04 IMAGING — CT CT ANGIO CHEST
2 of 6 series · 18 of 36 positions shown · IV contrast (ISOVUE 370)
Comparison: CT chest 03/06/2016, 02/21/2016, 01/05/2016

CLINICAL DATA: History of sickle cell. Left-sided chest pain
starting this morning about 3 a.m..

EXAM:
CT ANGIOGRAPHY CHEST WITH CONTRAST
TECHNIQUE: Multidetector CT imaging of the chest was performed using the
standard protocol during bolus administration of intravenous
contrast. Multiplanar CT image reconstructions and MIPs were
obtained to evaluate the vascular anatomy.
CONTRAST:  100 mL Isovue 370

[Series 6: thins for pacs · axial · 0.74mm/px · z∈[-291,-33]mm · 17 of 288 slices shown]
[im 15/288  lung]
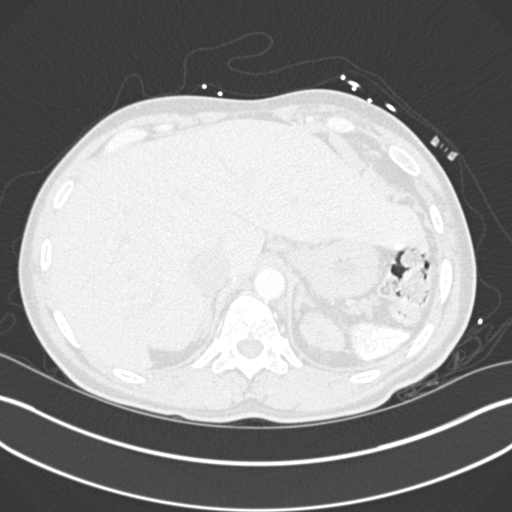
[im 29/288  mediastinal]
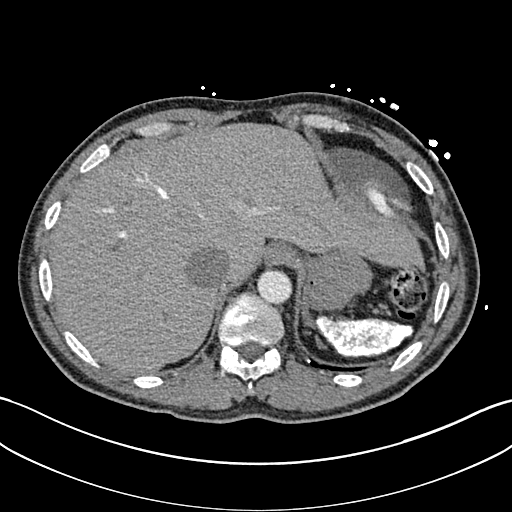
[im 44/288  lung]
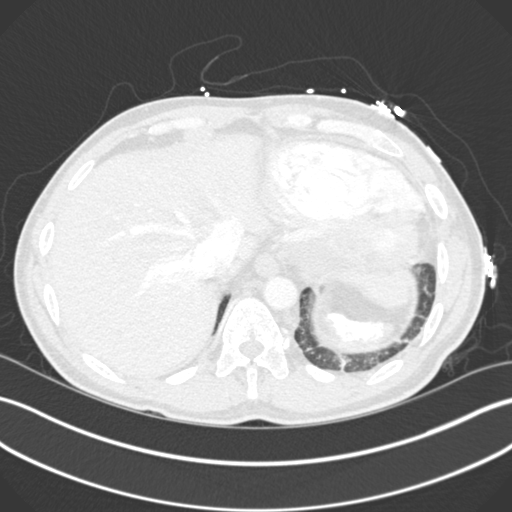
[im 58/288  mediastinal]
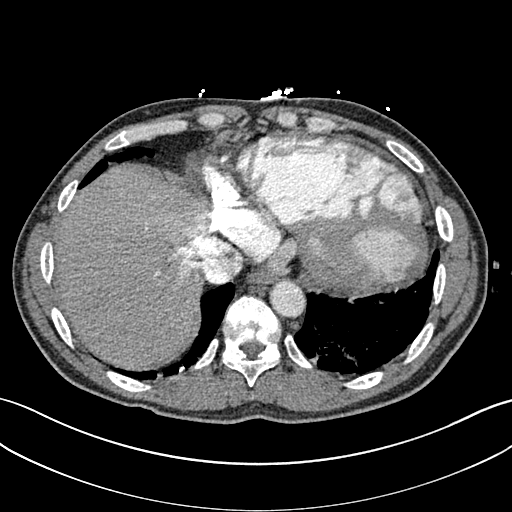
[im 87/288  lung]
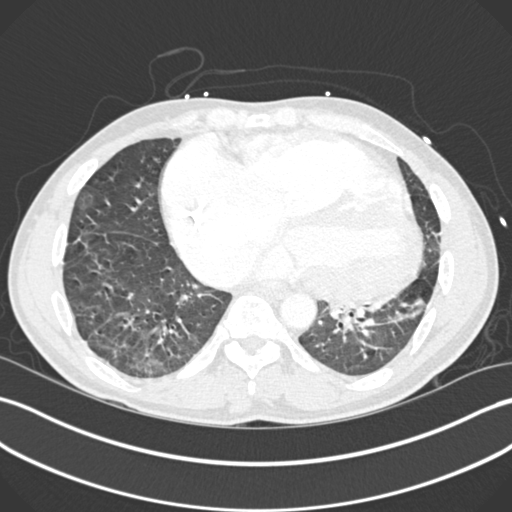
[im 101/288  mediastinal]
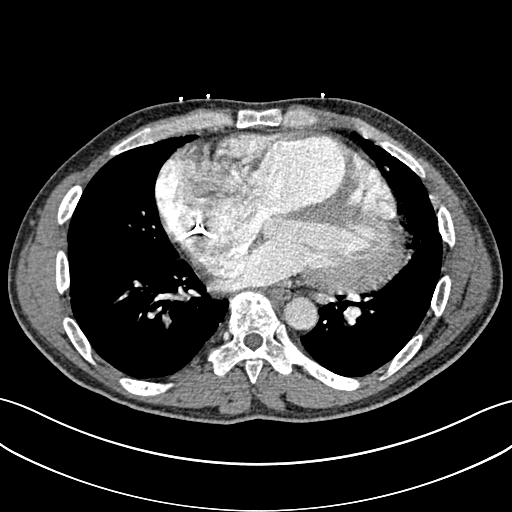
[im 115/288  lung]
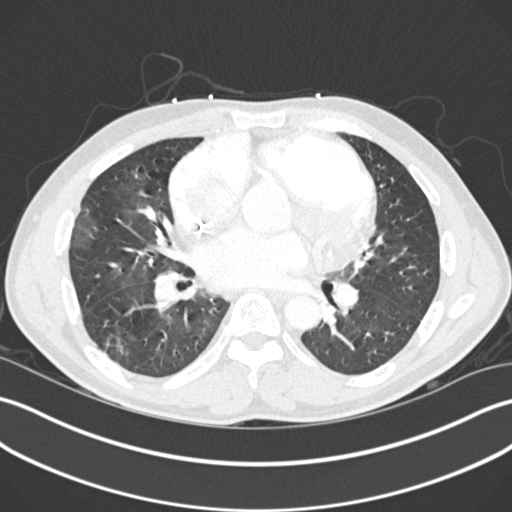
[im 130/288  mediastinal]
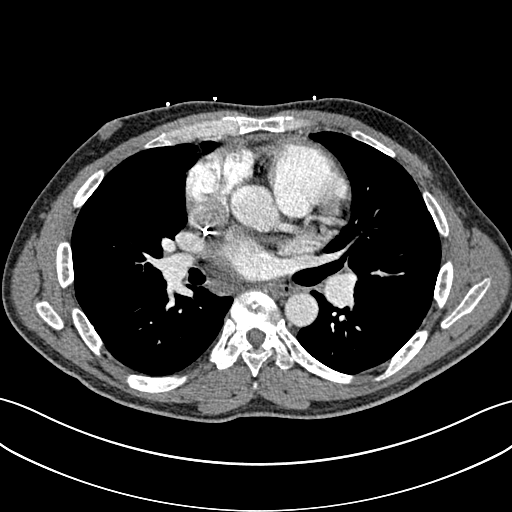
[im 144/288  lung]
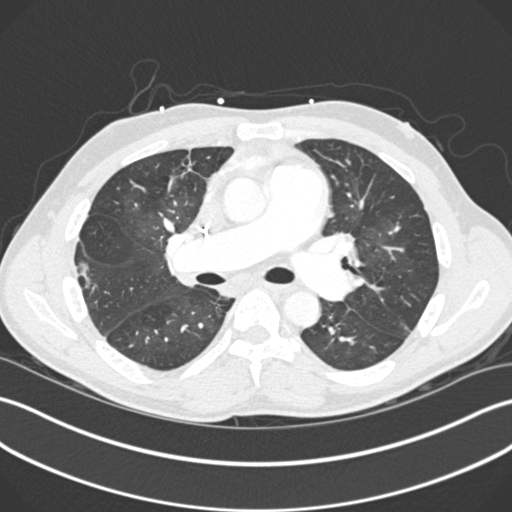
[im 158/288  mediastinal]
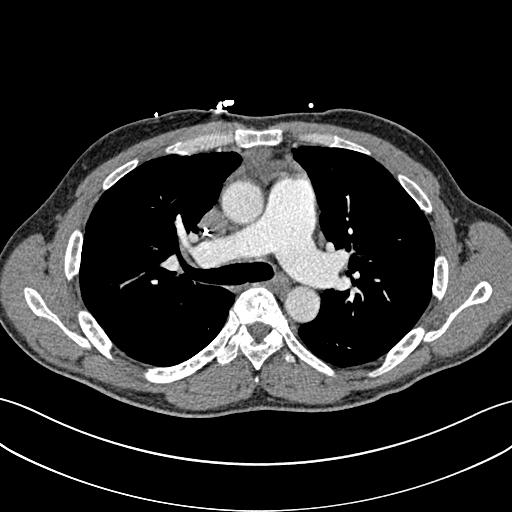
[im 173/288  lung]
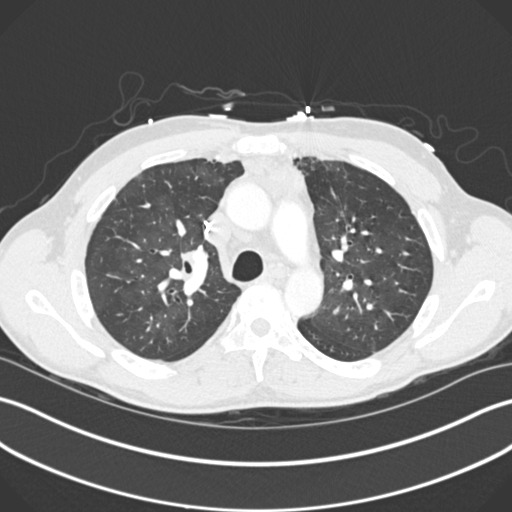
[im 187/288  mediastinal]
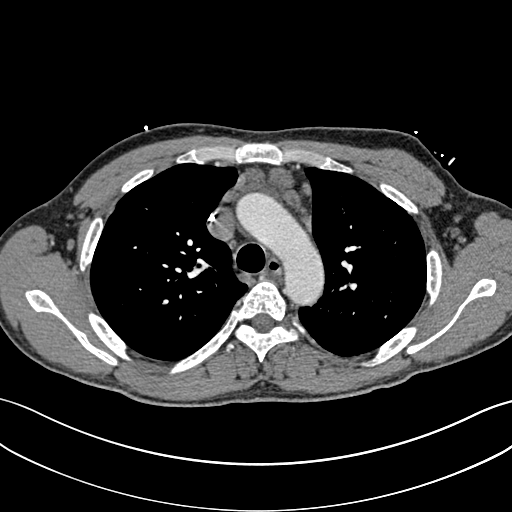
[im 201/288  lung]
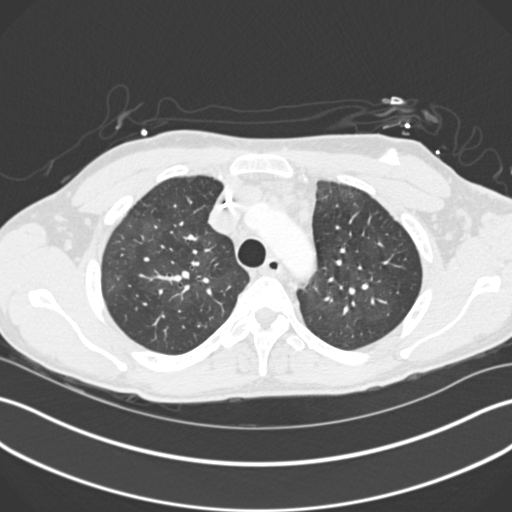
[im 230/288  mediastinal]
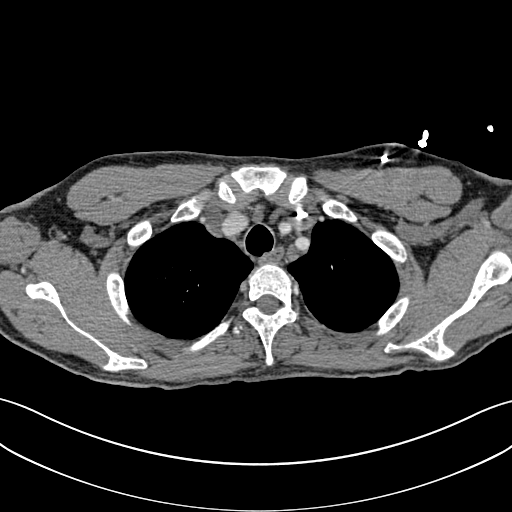
[im 244/288  lung]
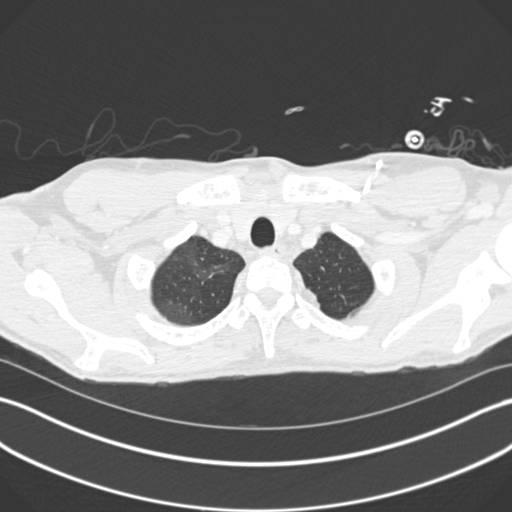
[im 259/288  mediastinal]
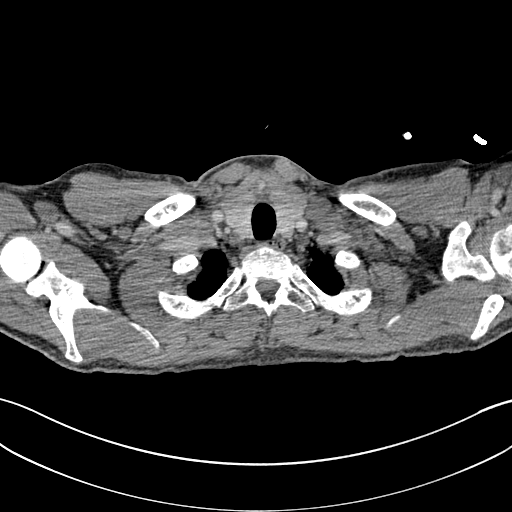
[im 273/288  lung]
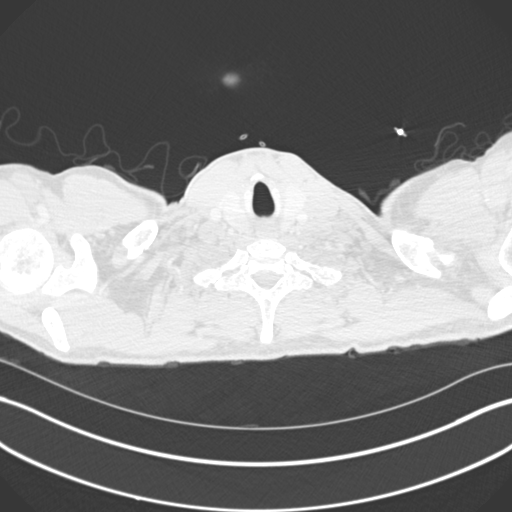

[Series 8: coronal mpr · coronal · 0.58mm/px · 1 of 151 slices shown]
[im 76/151  mediastinal]
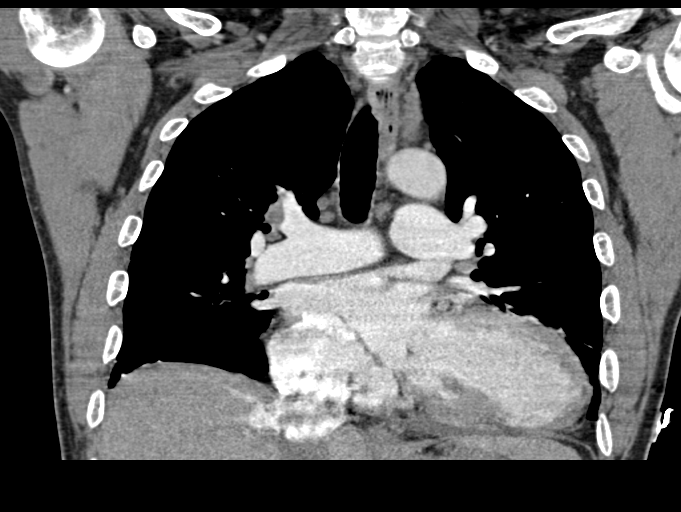

[18 of 36 positions shown; findings below may reference images not displayed]

FINDINGS: Cardiovascular: Satisfactory opacification of the pulmonary arteries
to the segmental level. No evidence of pulmonary embolism. Stable
cardiomegaly. No pericardial effusion. Normal caliber thoracic
aorta. No thoracic aortic atherosclerosis or dissection. Left-sided
Port-A-Cath again noted.

Mediastinum/Nodes: Thyroid gland, trachea, and esophagus demonstrate
no significant findings. Multiple small prevascular lymph nodes
unchanged compared with the prior exams. Mildly enlarged stable
subcarinal lymph node. No axillary lymphadenopathy.

Lungs/Pleura: Patchy areas of ground-glass opacities in bilateral
upper lobes and lower lobes which may reflect mild pulmonary edema
versus alveolitis secondary to an infectious or inflammatory
etiology. No pleural effusion or pneumothorax.

Upper Abdomen: No acute abnormality in the upper abdomen. Chronic
calcified, involuted spleen.

Musculoskeletal: No acute osseous abnormality. No lytic or sclerotic
osseous lesion.

Soft tissue: Bilateral retroareolar fibroglandular soft tissue as
can be seen with gynecomastia.

Review of the MIP images confirms the above findings.
IMPRESSION: 1. No evidence of acute pulmonary embolus.
2. Stable cardiomegaly.
3. Patchy areas of ground-glass opacities in bilateral upper lobes
and lower lobes which may reflect mild pulmonary edema versus
alveolitis secondary to an infectious or inflammatory etiology.

## 2019-02-13 IMAGING — CR DG CHEST 2V
2 series · 2 of 2 positions shown · non-contrast
Comparison: Chest CT 03/13/2016 and radiographs 03/11/2016

CLINICAL DATA: Chest/bilateral rib pain since last night. Sickle
cell crisis.

EXAM:
CHEST  2 VIEW

[w chest pa]
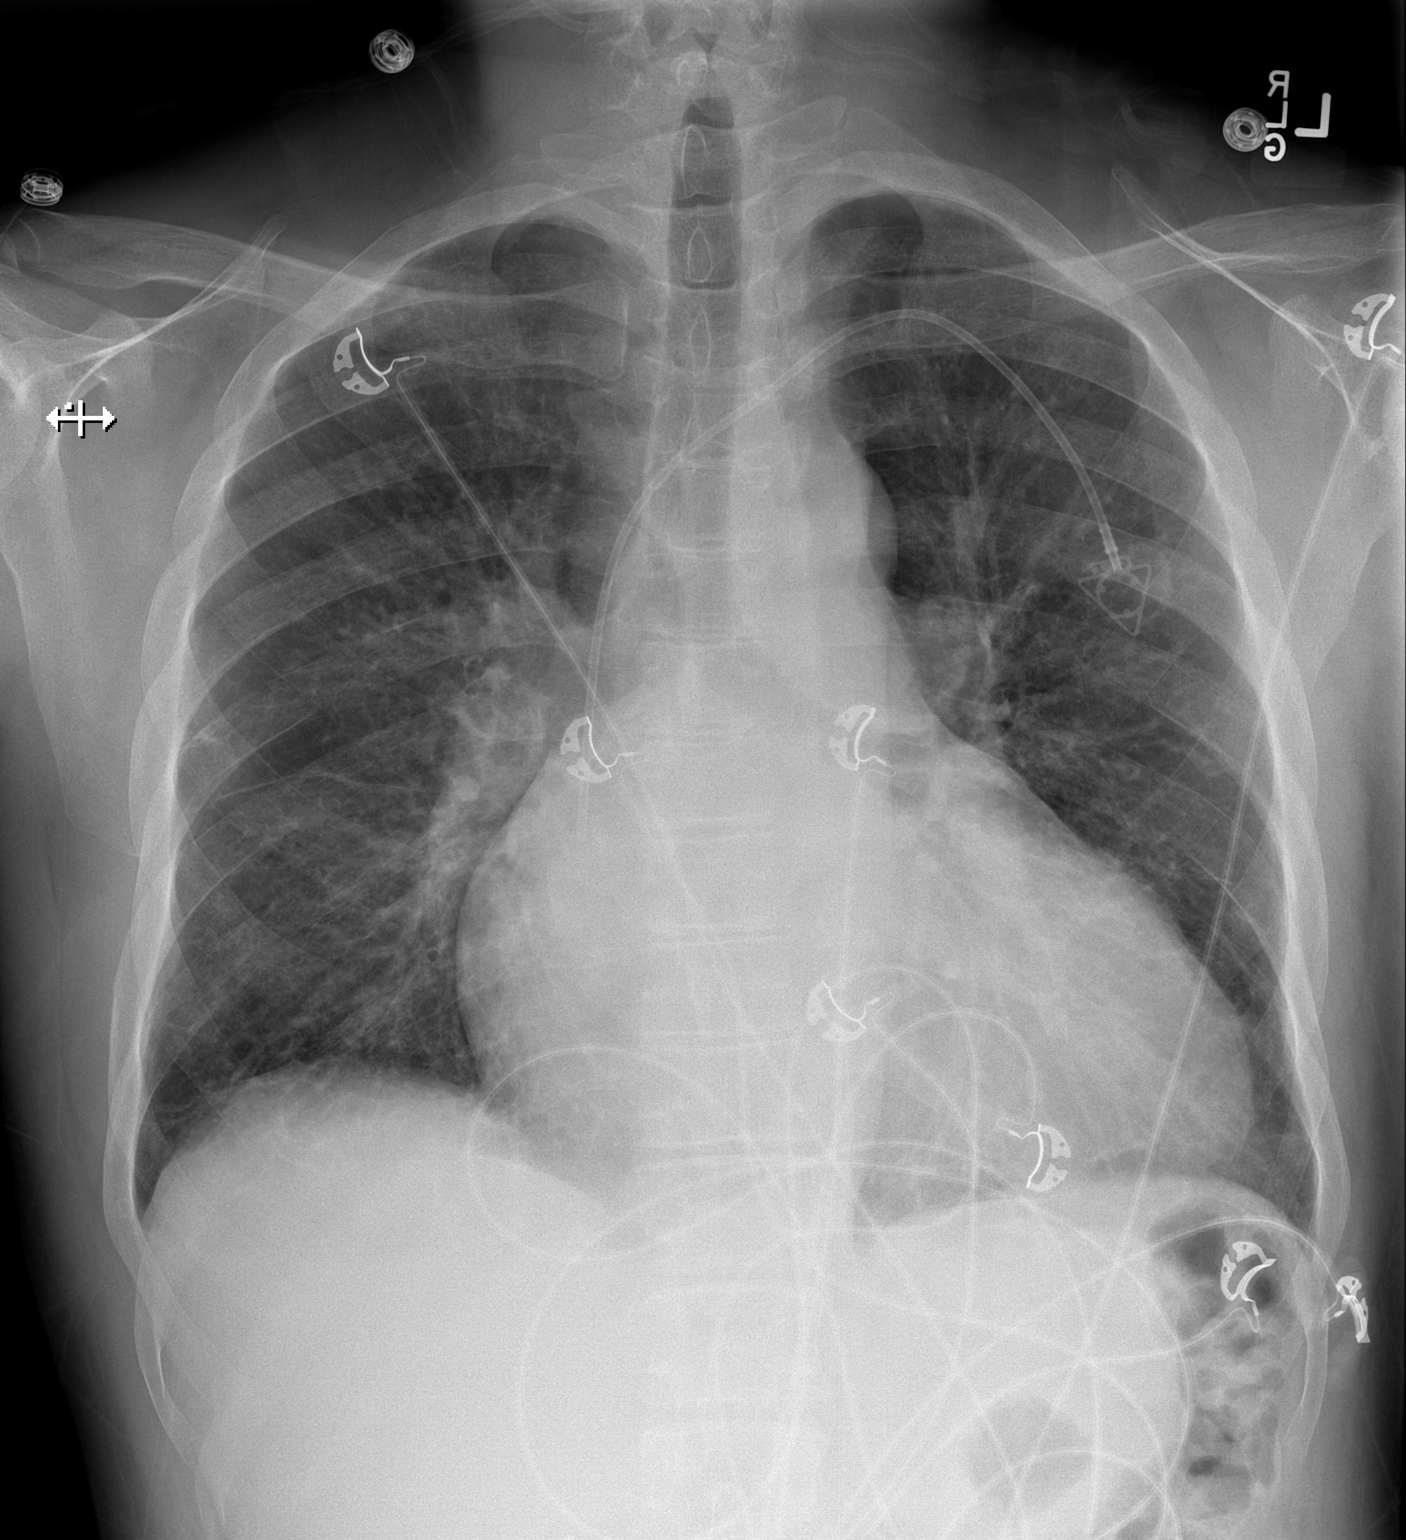

[w chest lat]
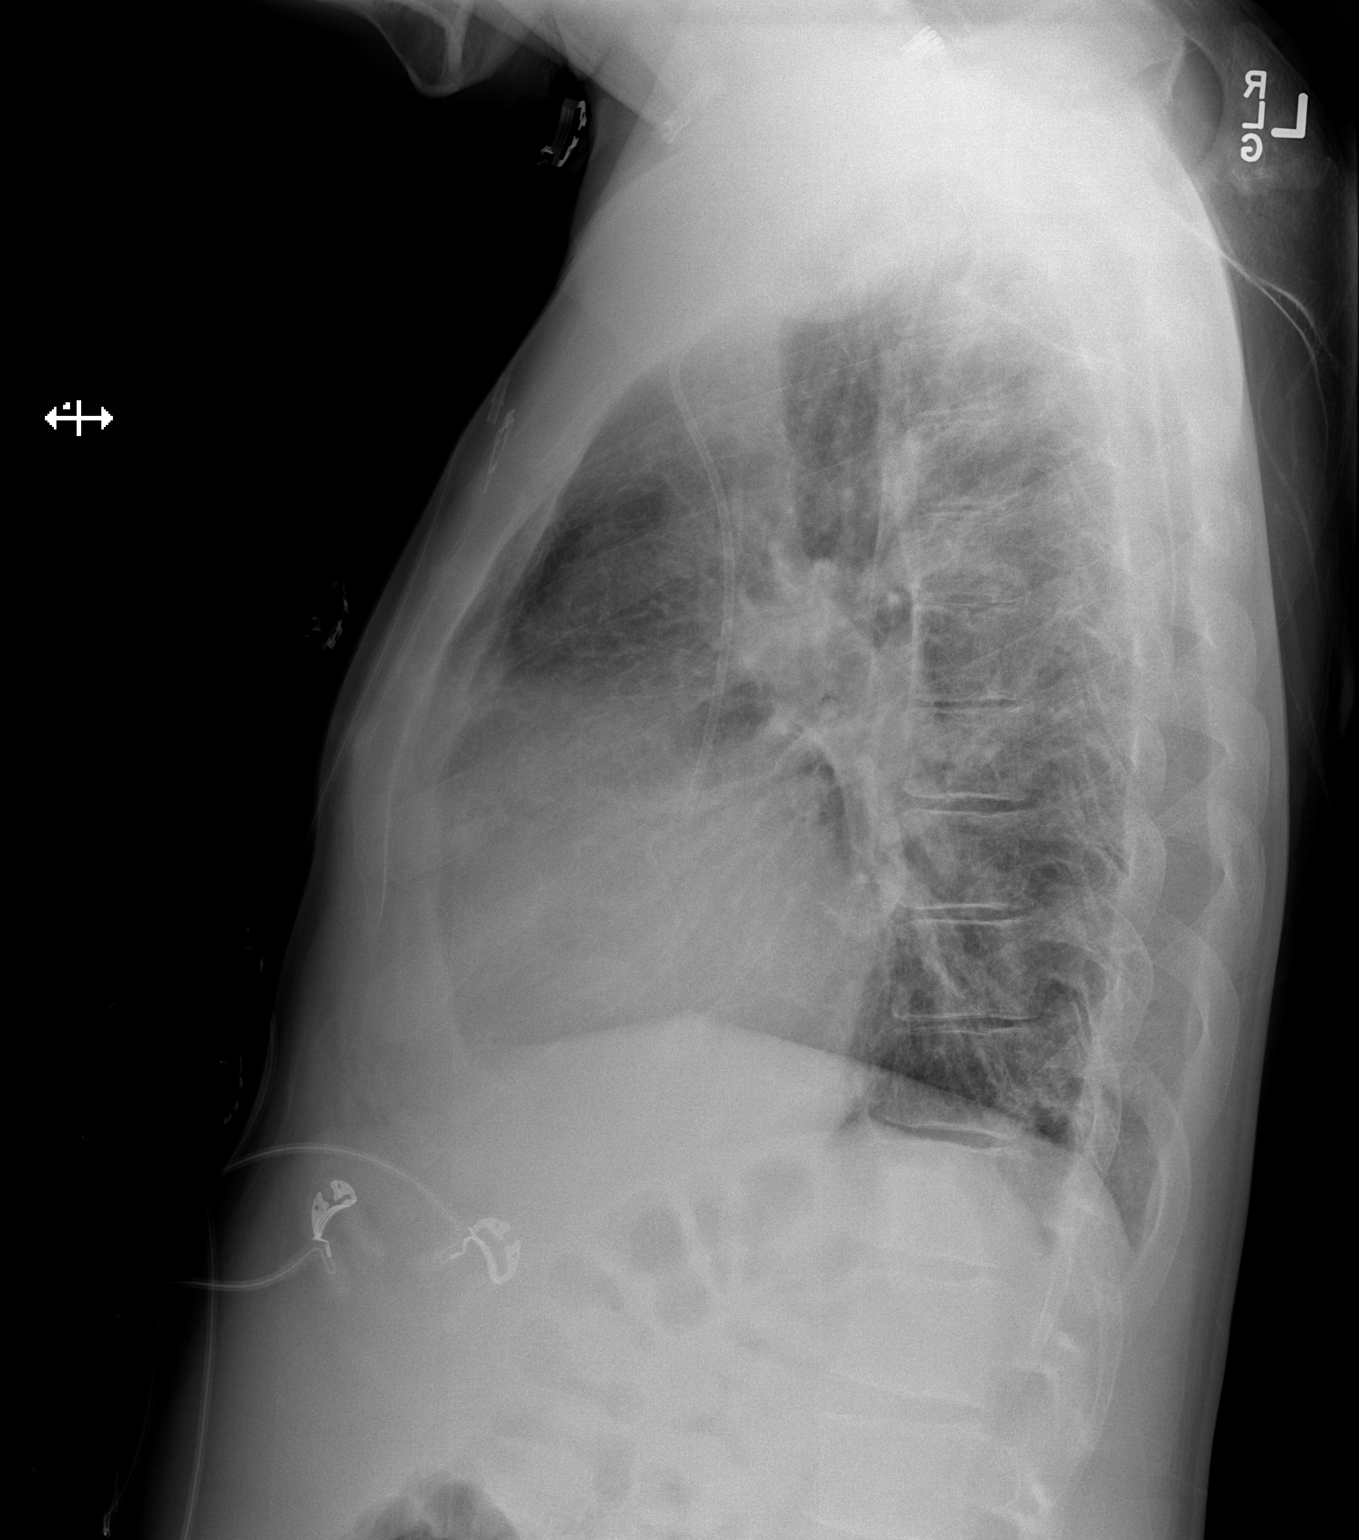

[2 of 2 positions shown; findings below may reference images not displayed]

FINDINGS: Left subclavian Port-A-Cath remains in place, with tip overlying the
lower SVC. Cardiac silhouette remains moderately enlarged,
unchanged. Mild diffuse chronic interstitial accentuation is
unchanged. Focal scarring is noted in the right mid lung. There is
no evidence of acute airspace consolidation, pulmonary edema,
pleural effusion, or pneumothorax. No acute osseous abnormality is
seen.
IMPRESSION: No active cardiopulmonary disease.

## 2019-02-18 IMAGING — CR DG CHEST 2V
2 series · 2 of 2 positions shown · non-contrast
Comparison: 03/22/2016

CLINICAL DATA: Left chest pain.  Sickle cell anemia

EXAM:
CHEST  2 VIEW

[w chest lat]
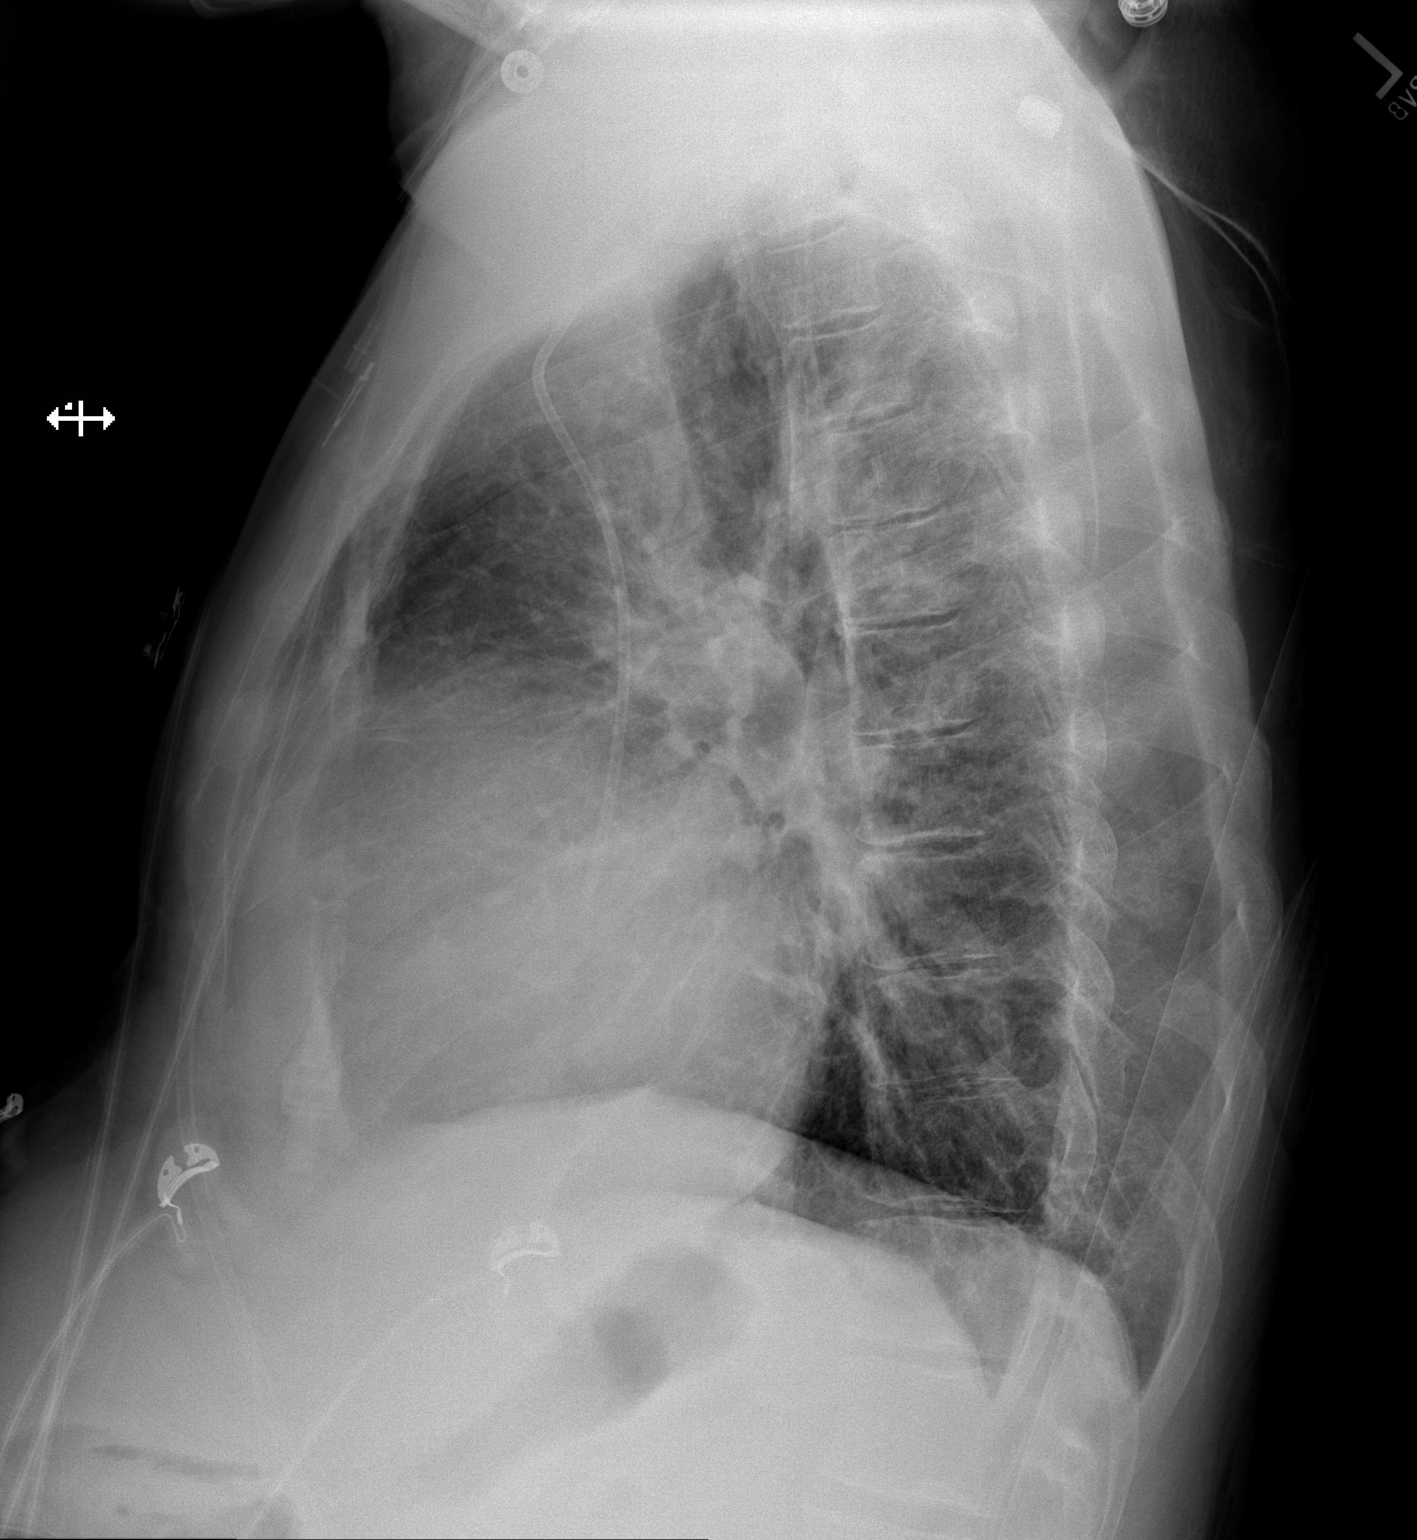

[x chest ap]
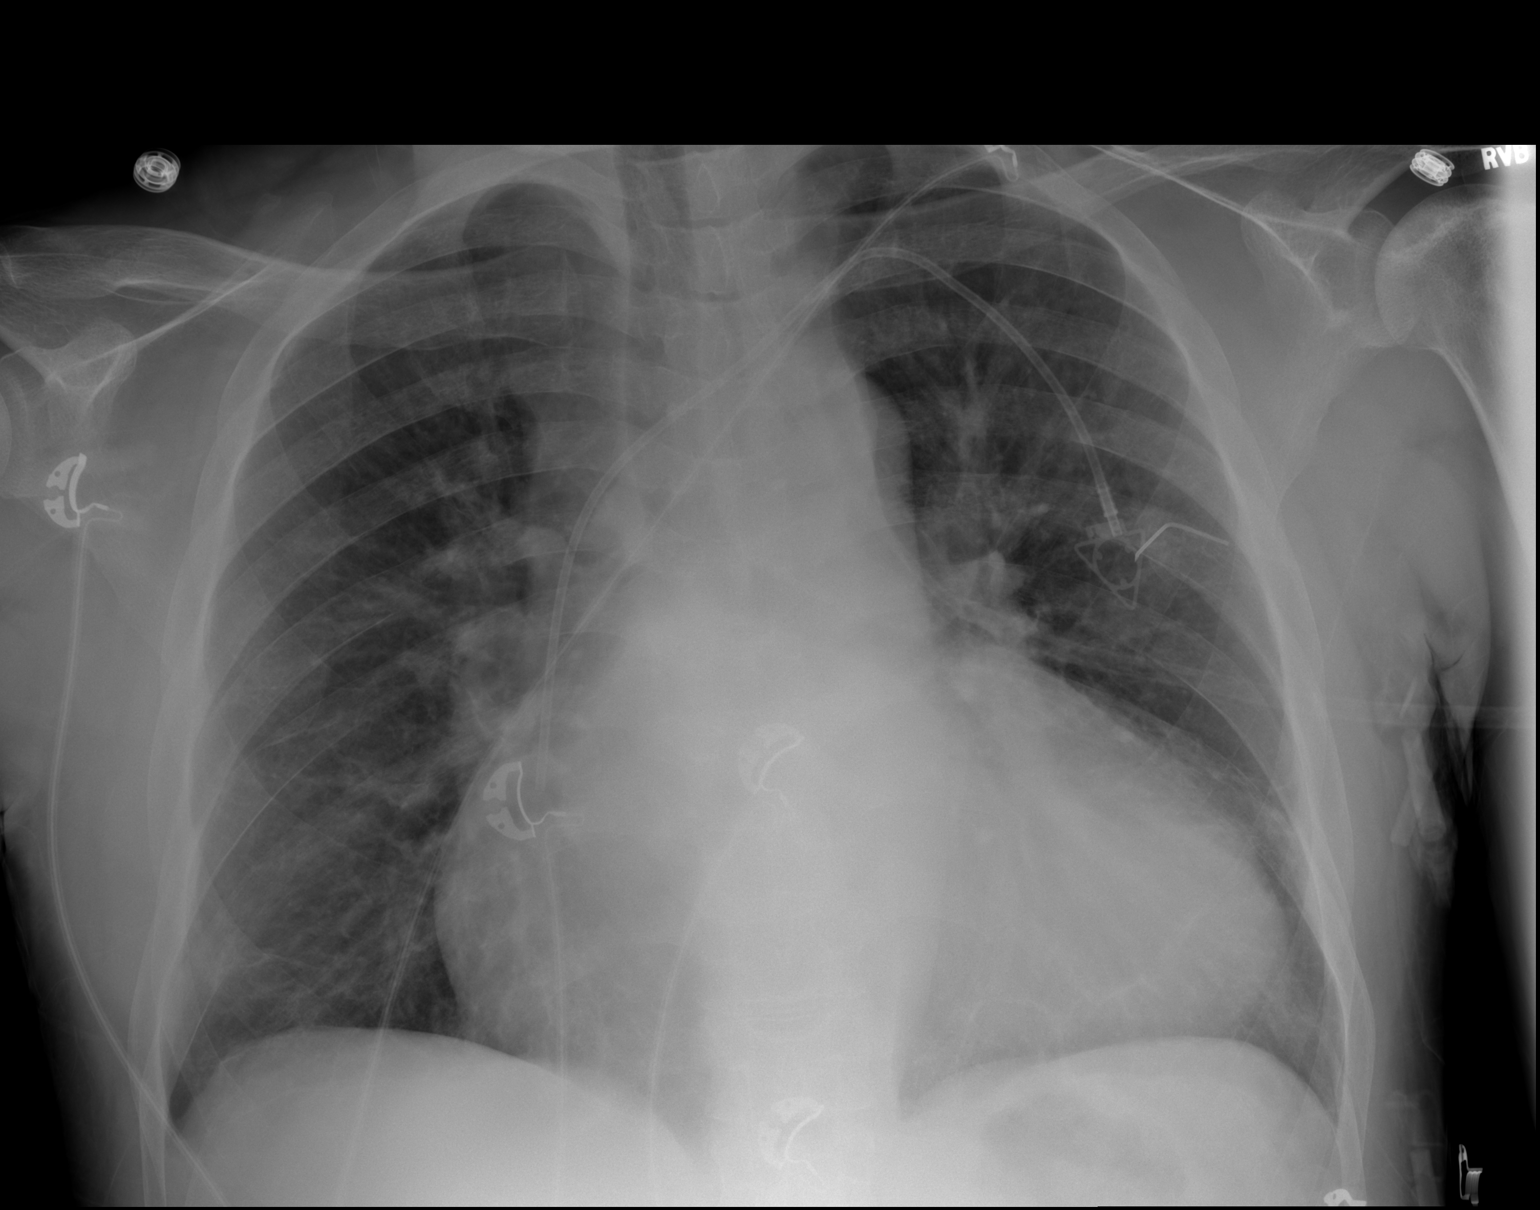

[2 of 2 positions shown; findings below may reference images not displayed]

FINDINGS: Cardiac enlargement with vascular congestion. Negative for edema or
effusion. Negative for pneumonia. Port-A-Cath tip in the SVC.
IMPRESSION: Cardiac enlargement with pulmonary vascular congestion. Negative for
pneumonia or edema.

## 2019-03-03 IMAGING — CR DG CHEST 2V
2 series · 2 of 2 positions shown · non-contrast
Comparison: 03/27/2016

CLINICAL DATA: Bilateral chest wall pain. Sickle cell disease. Pain
began yesterday.

EXAM:
CHEST  2 VIEW

[w chest pa]
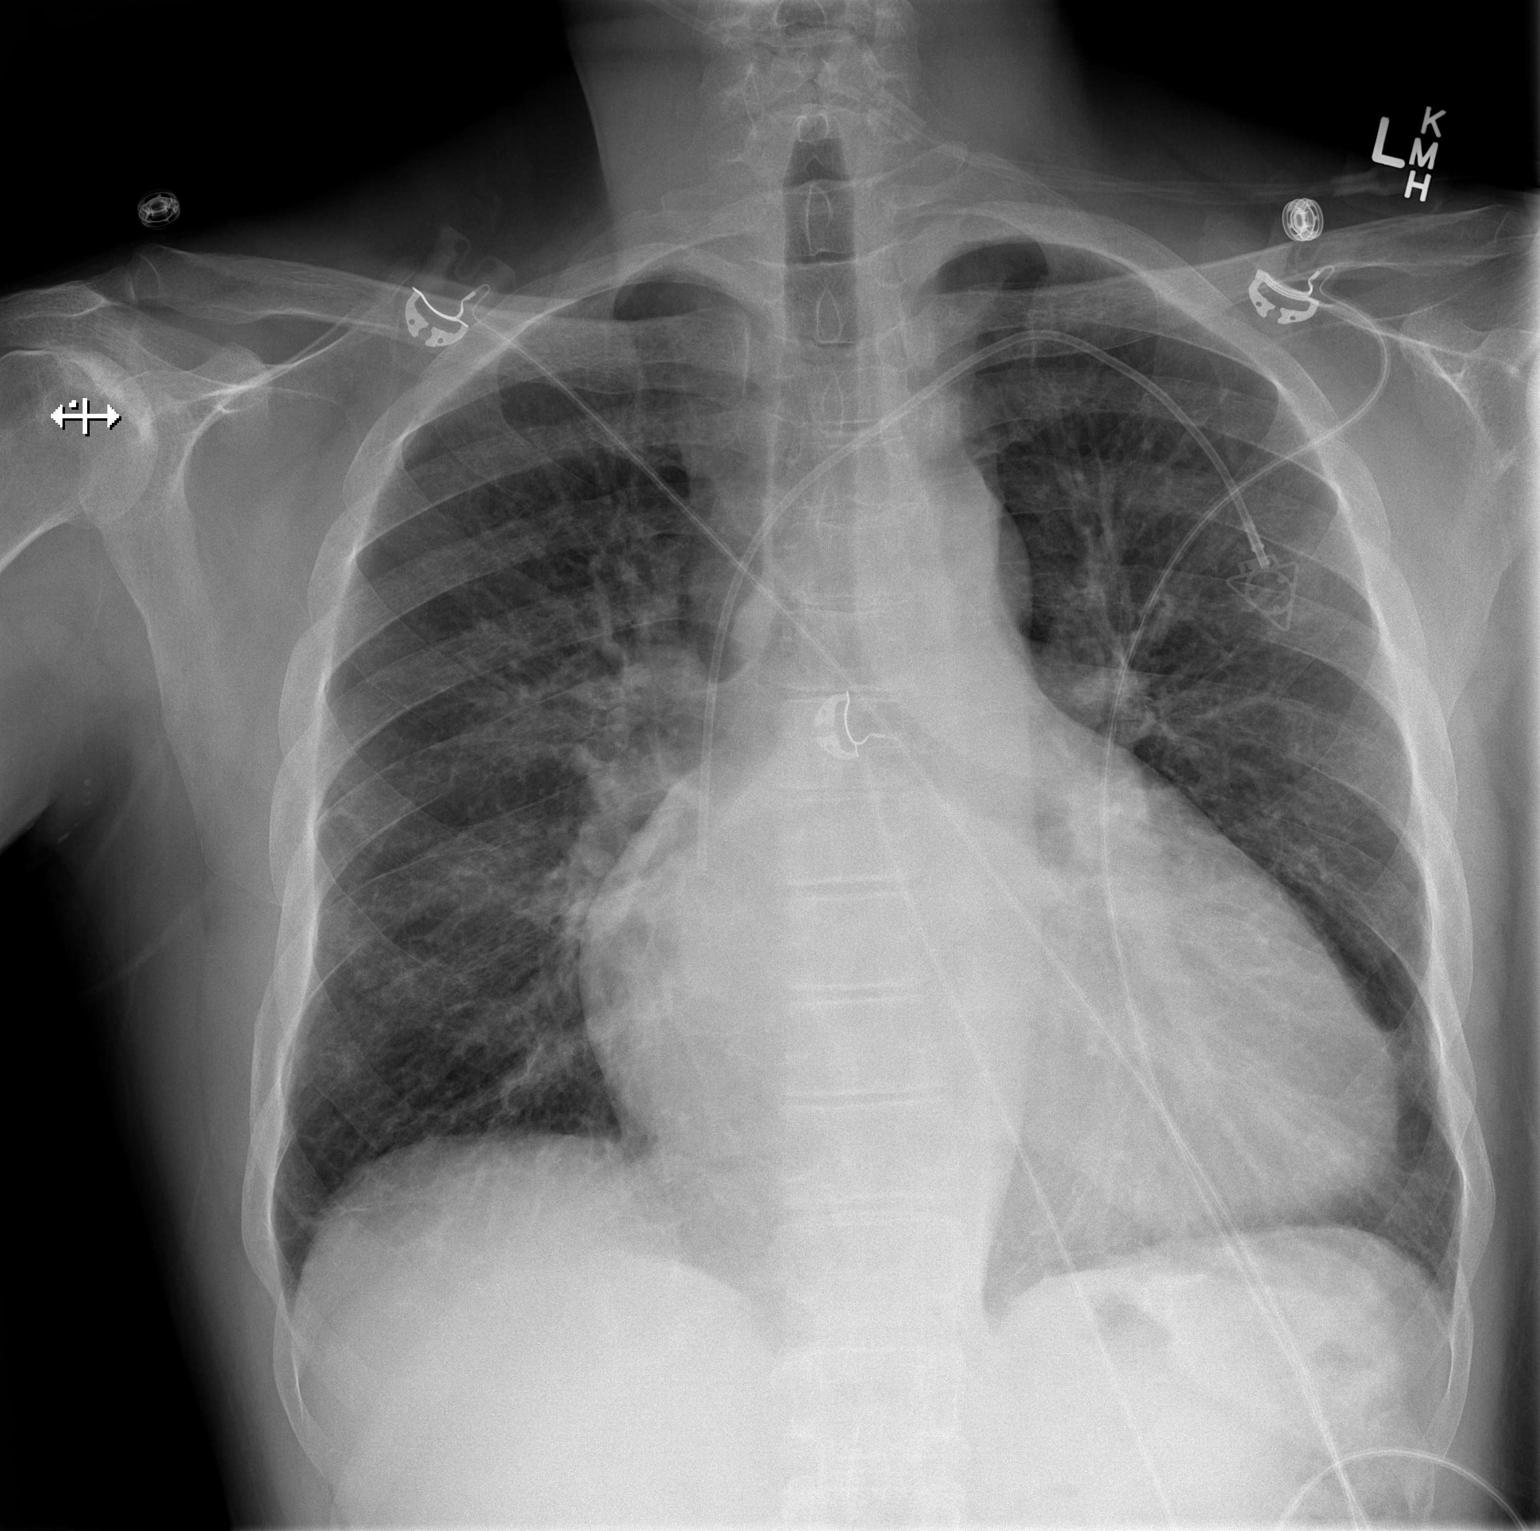

[w chest lat]
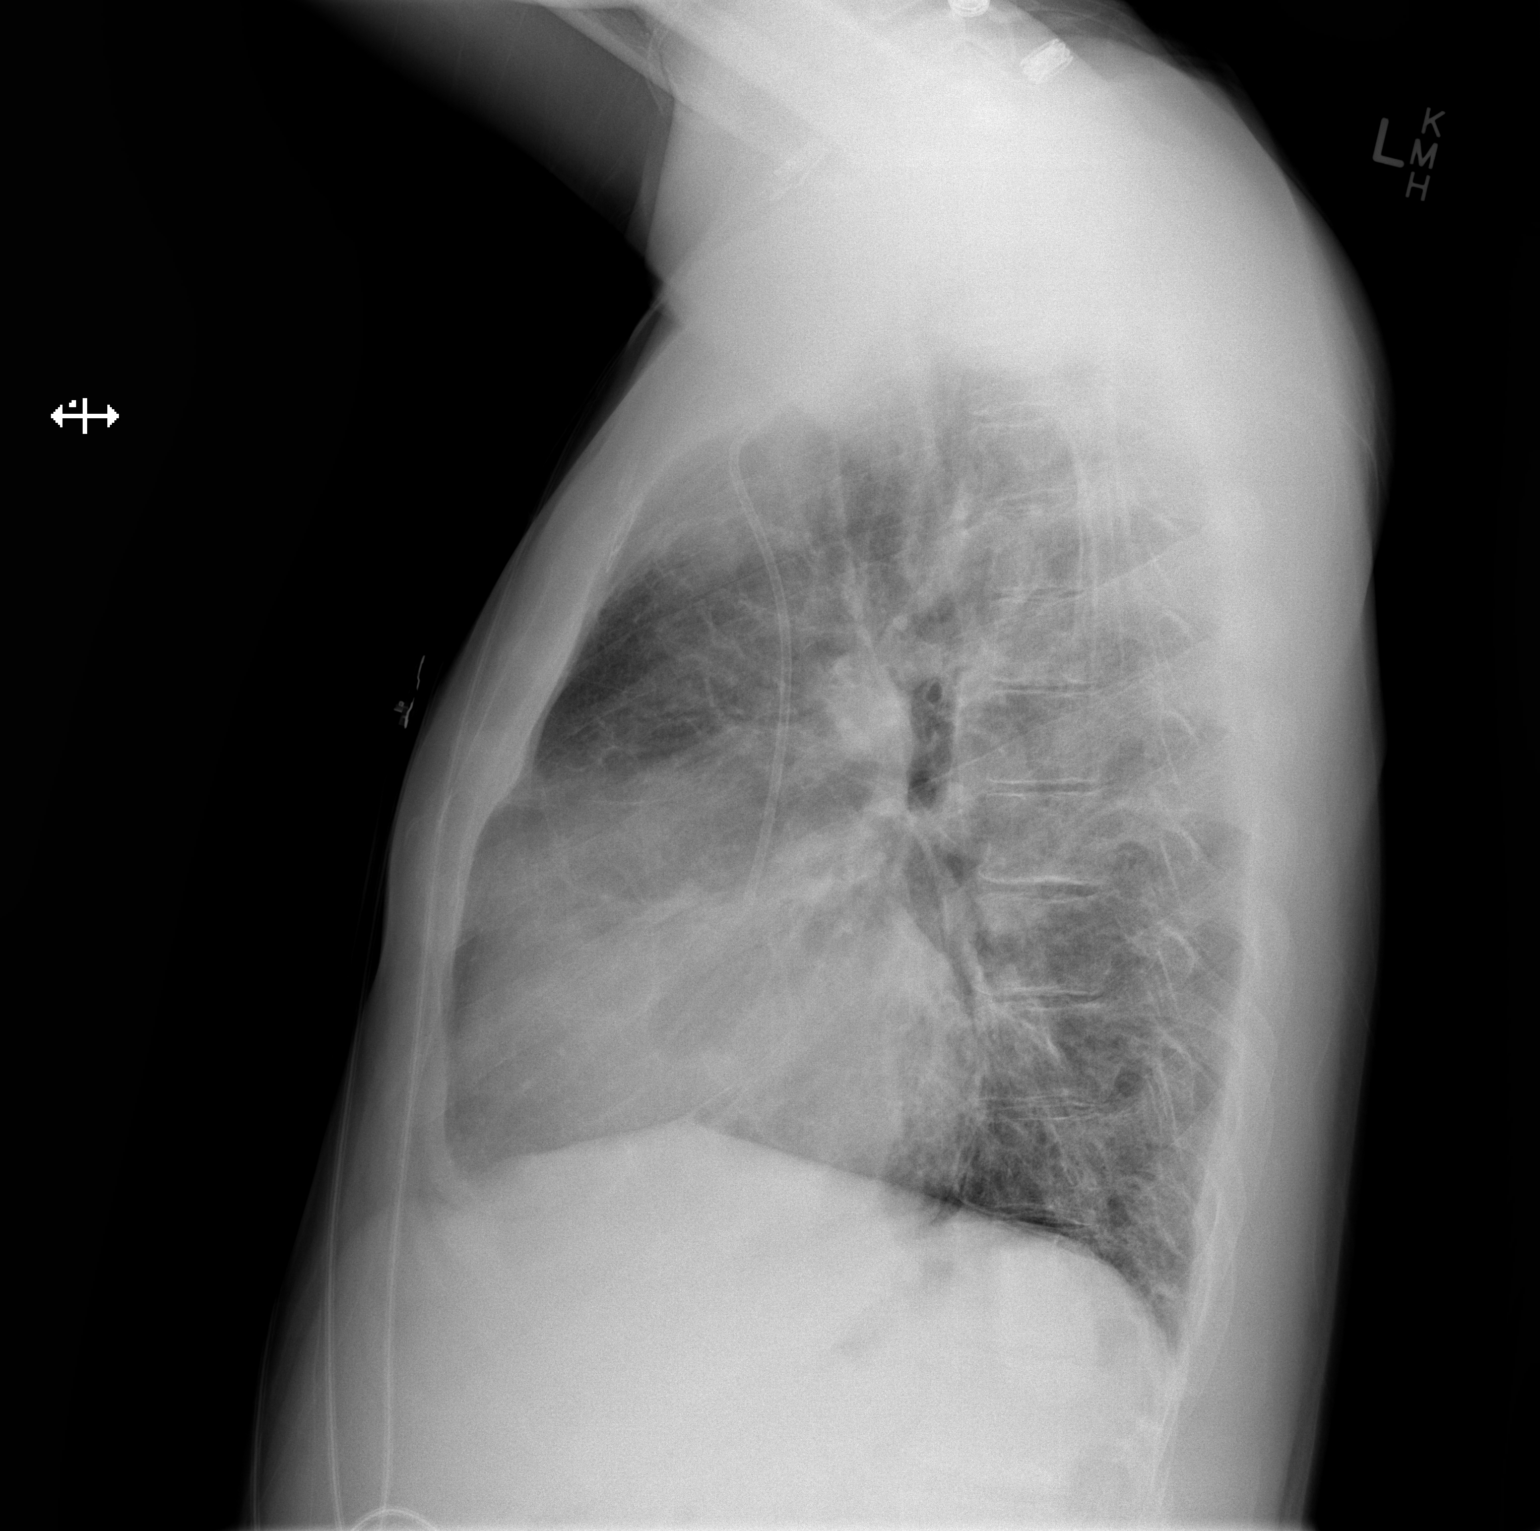

[2 of 2 positions shown; findings below may reference images not displayed]

FINDINGS: Left subclavian port extends to the low SVC.

Unchanged cardiomegaly. No airspace consolidation. No pleural
effusions. Pulmonary vasculature is normal. Hilar and mediastinal
contours are unremarkable and unchanged.
IMPRESSION: Unchanged cardiomegaly.  No consolidation or effusion.

## 2019-03-05 IMAGING — DX DG CHEST 1V PORT
1 series · 1 of 1 positions shown · non-contrast
Comparison: Prior radiograph from 04/09/2016.

CLINICAL DATA: Initial evaluation for sickle cell pain crisis.
Worsened shortness of breath.

EXAM:
PORTABLE CHEST 1 VIEW

[chest ap]
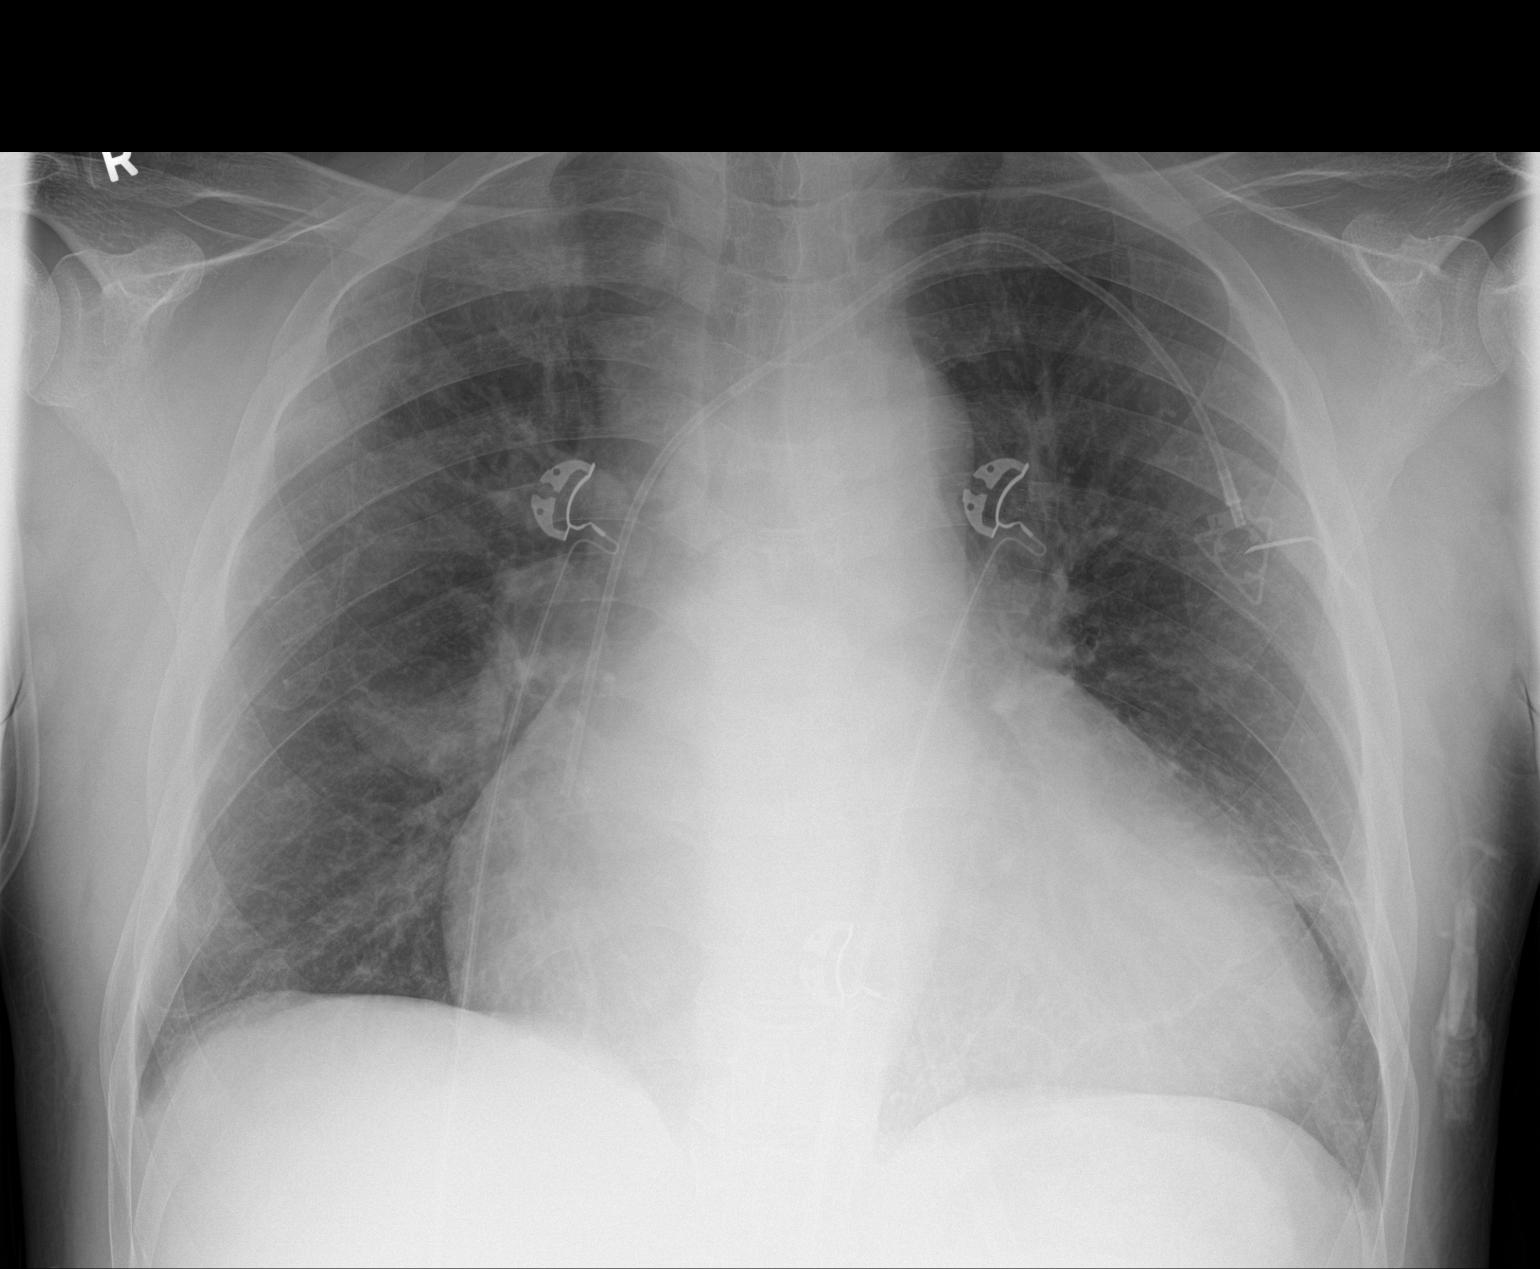

[1 of 1 positions shown; findings below may reference images not displayed]

FINDINGS: Left-sided Port-A-Cath in place, stable. Severe cardiomegaly not
significantly changed. Mediastinal silhouette within normal limits.

Lungs mildly hypoinflated. There is slightly worsened pulmonary
vascular congestion as compared to previous. Slightly worsened right
infrahilar opacity, which may reflect developing infiltrate and/or
atelectasis. More linear opacities at the bilateral lung bases most
consistent with atelectasis. No pleural effusion. No pneumothorax.

No acute osseous abnormality.
IMPRESSION: 1. Severe cardiomegaly with slightly worsened diffuse pulmonary
vascular congestion as compared to 04/09/16, which may reflect
developing pulmonary edema.
2. Slightly patchy right infrahilar opacity, which may reflect
congestion and/or developing infiltrate.
3. Mildly increased superimposed bibasilar atelectasis.

## 2019-03-07 IMAGING — DX DG CHEST 2V
2 series · 2 of 2 positions shown · non-contrast
Comparison: PA and lateral chest 04/09/2016 and 01/31/2016.
Single-view of the chest 04/11/2016.

CLINICAL DATA: History of sickle cell disease. Patient admitted
04/10/2016 with chest pain. Hypoxia and pulmonary edema.

EXAM:
CHEST  2 VIEW

[chest lat]
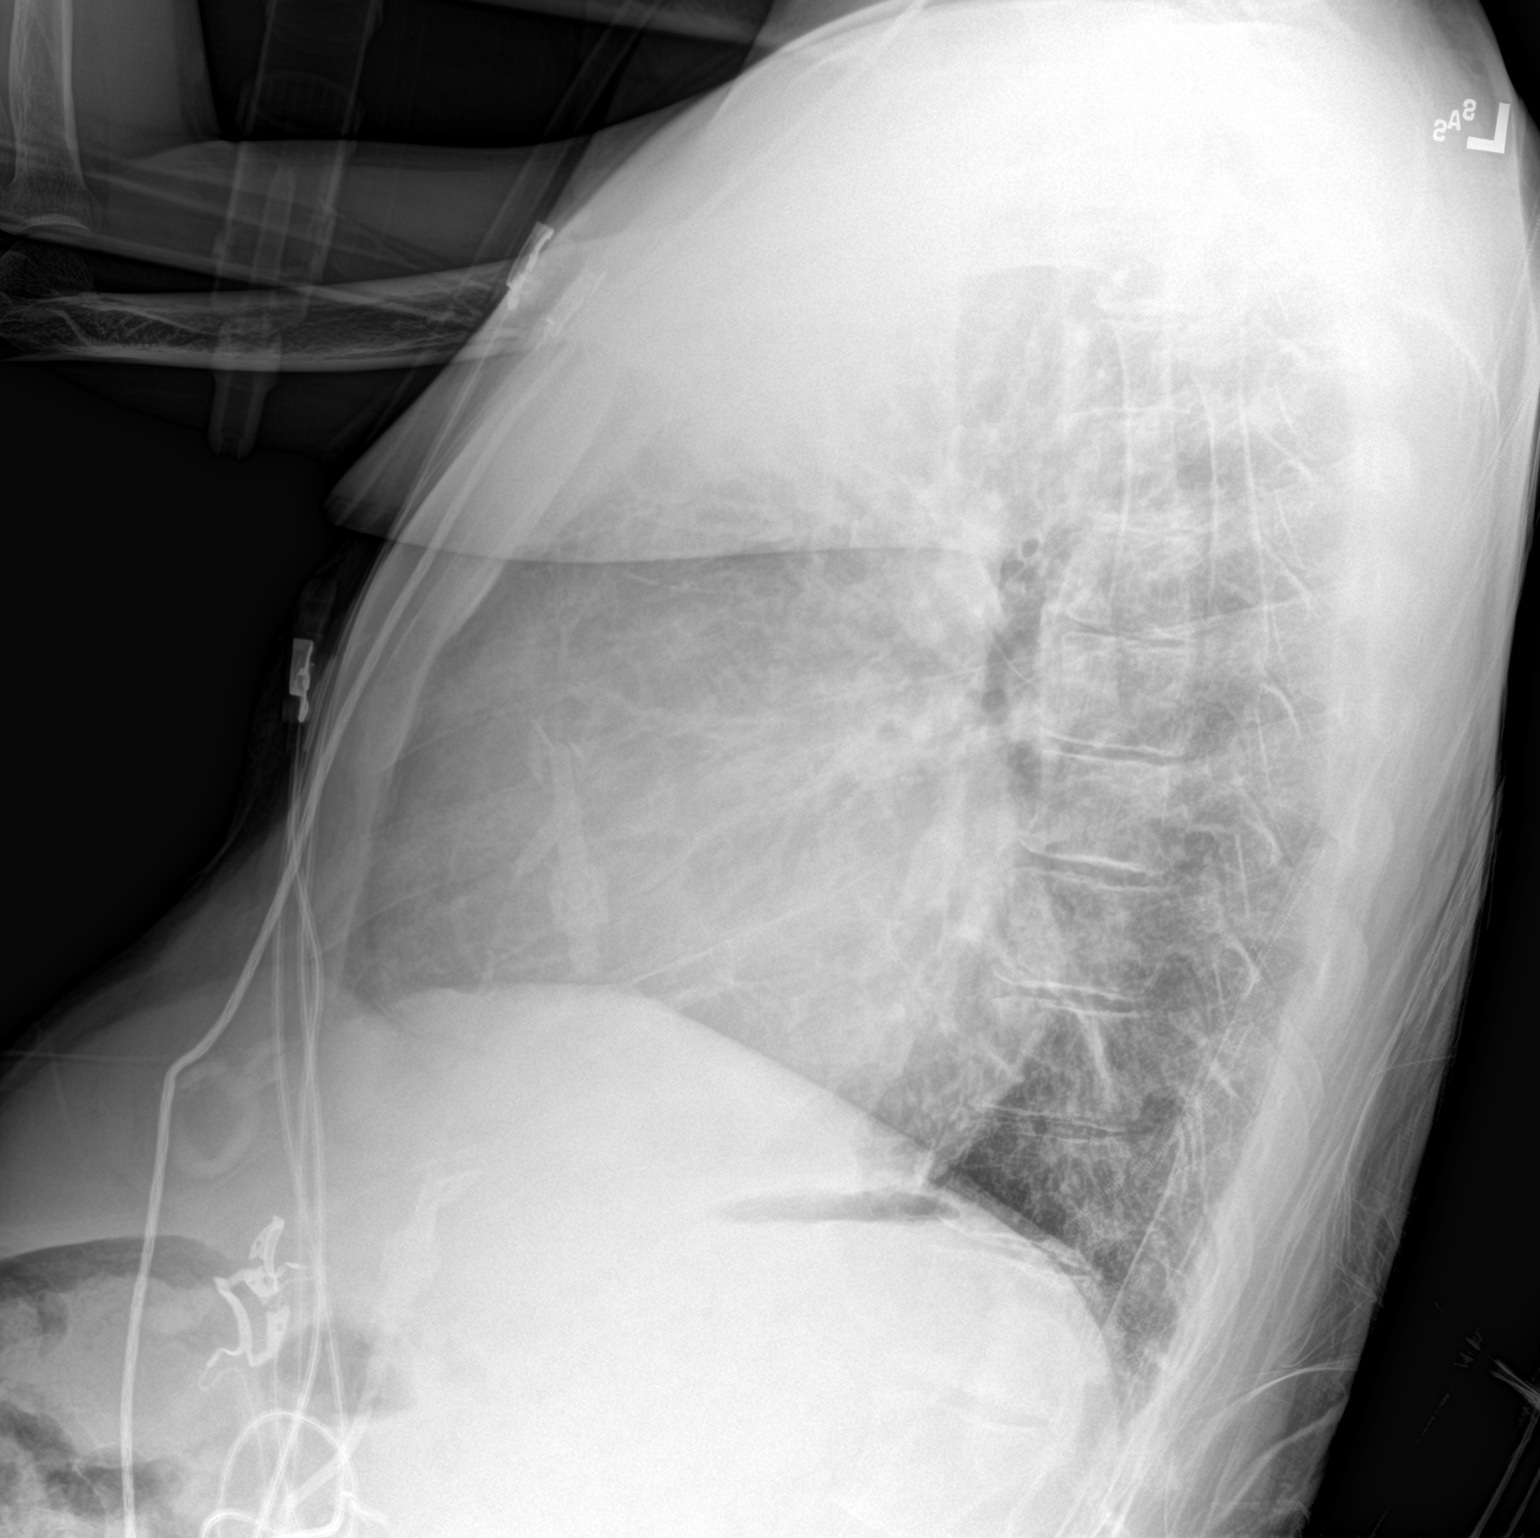

[chest ap]
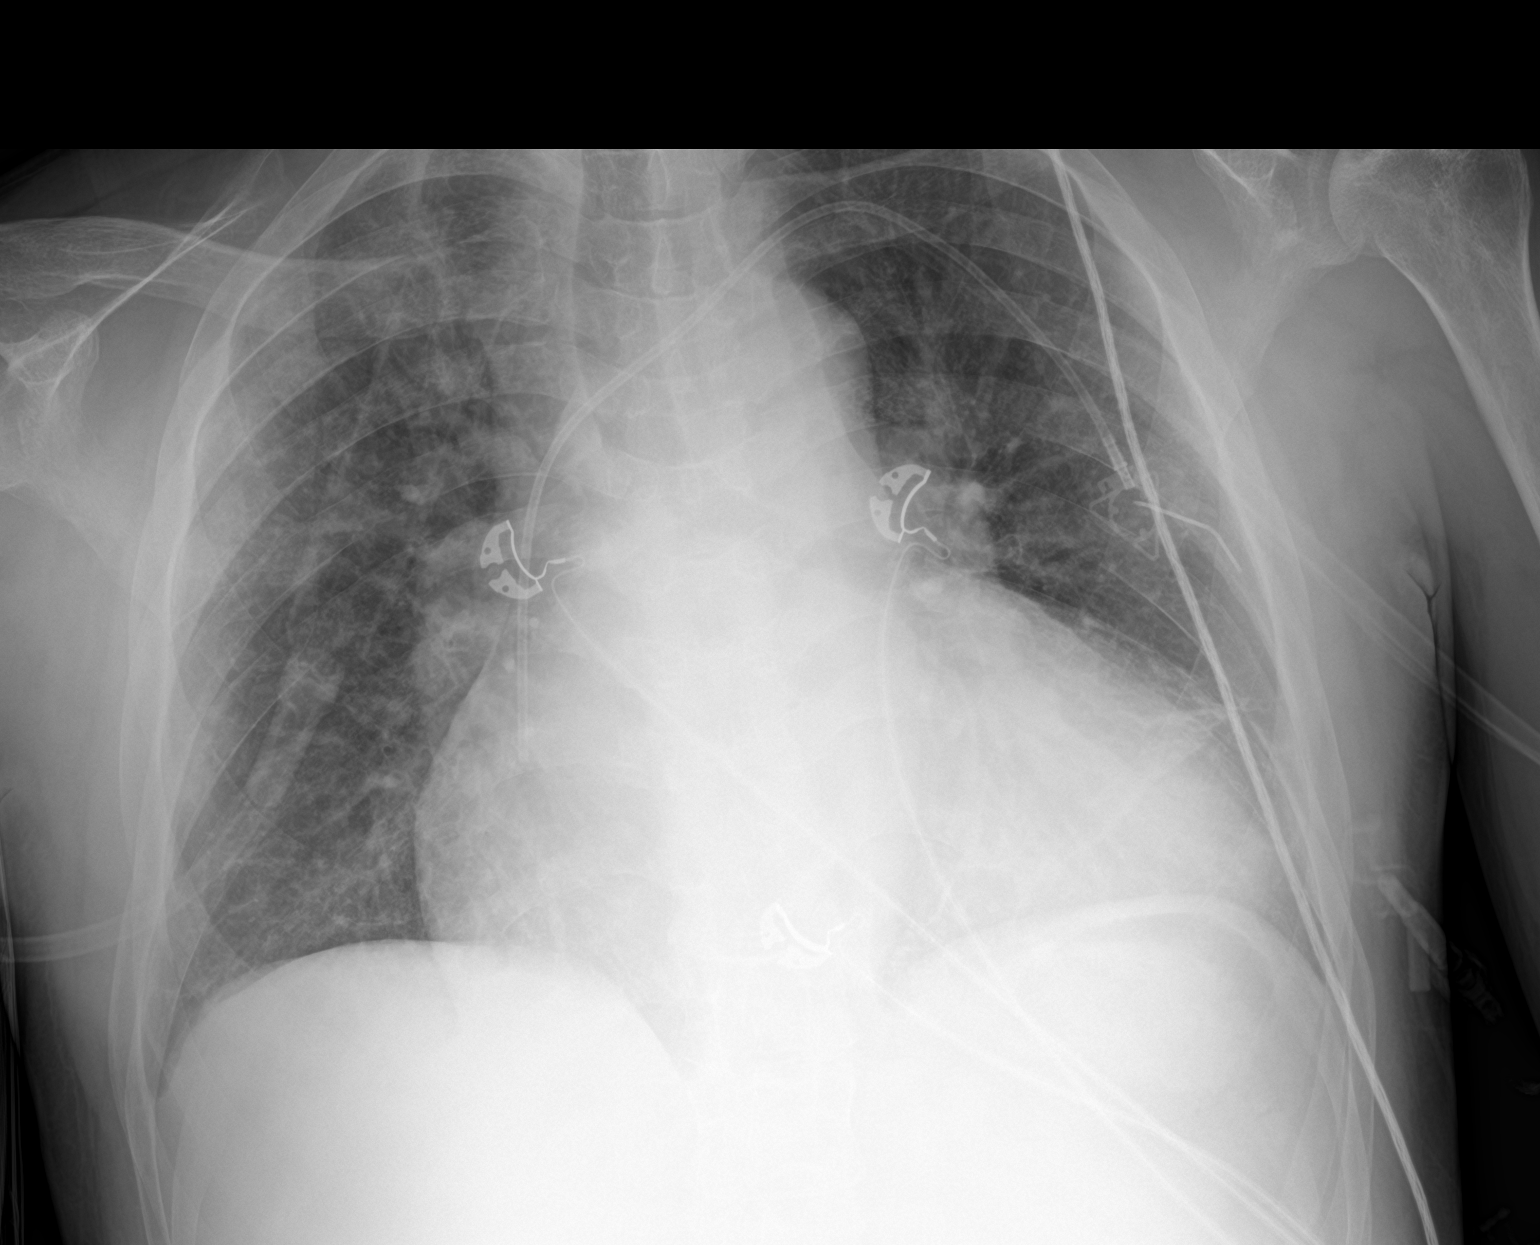

[2 of 2 positions shown; findings below may reference images not displayed]

FINDINGS: Marked cardiomegaly is again seen. There is mild interstitial edema
which appears unchanged since yesterday's examination. No
pneumothorax or pleural effusion. No consolidative process.
Port-A-Cath is in place. Avascular necrosis left humeral head is
noted.
IMPRESSION: No change in mild interstitial edema in patient with marked
cardiomegaly.

## 2019-03-11 IMAGING — DX DG CHEST 1V PORT
1 series · 1 of 1 positions shown · non-contrast
Comparison: None.

CLINICAL DATA: Lethargy.  Shortness of breath.

EXAM:
PORTABLE CHEST 1 VIEW

[chest ap]
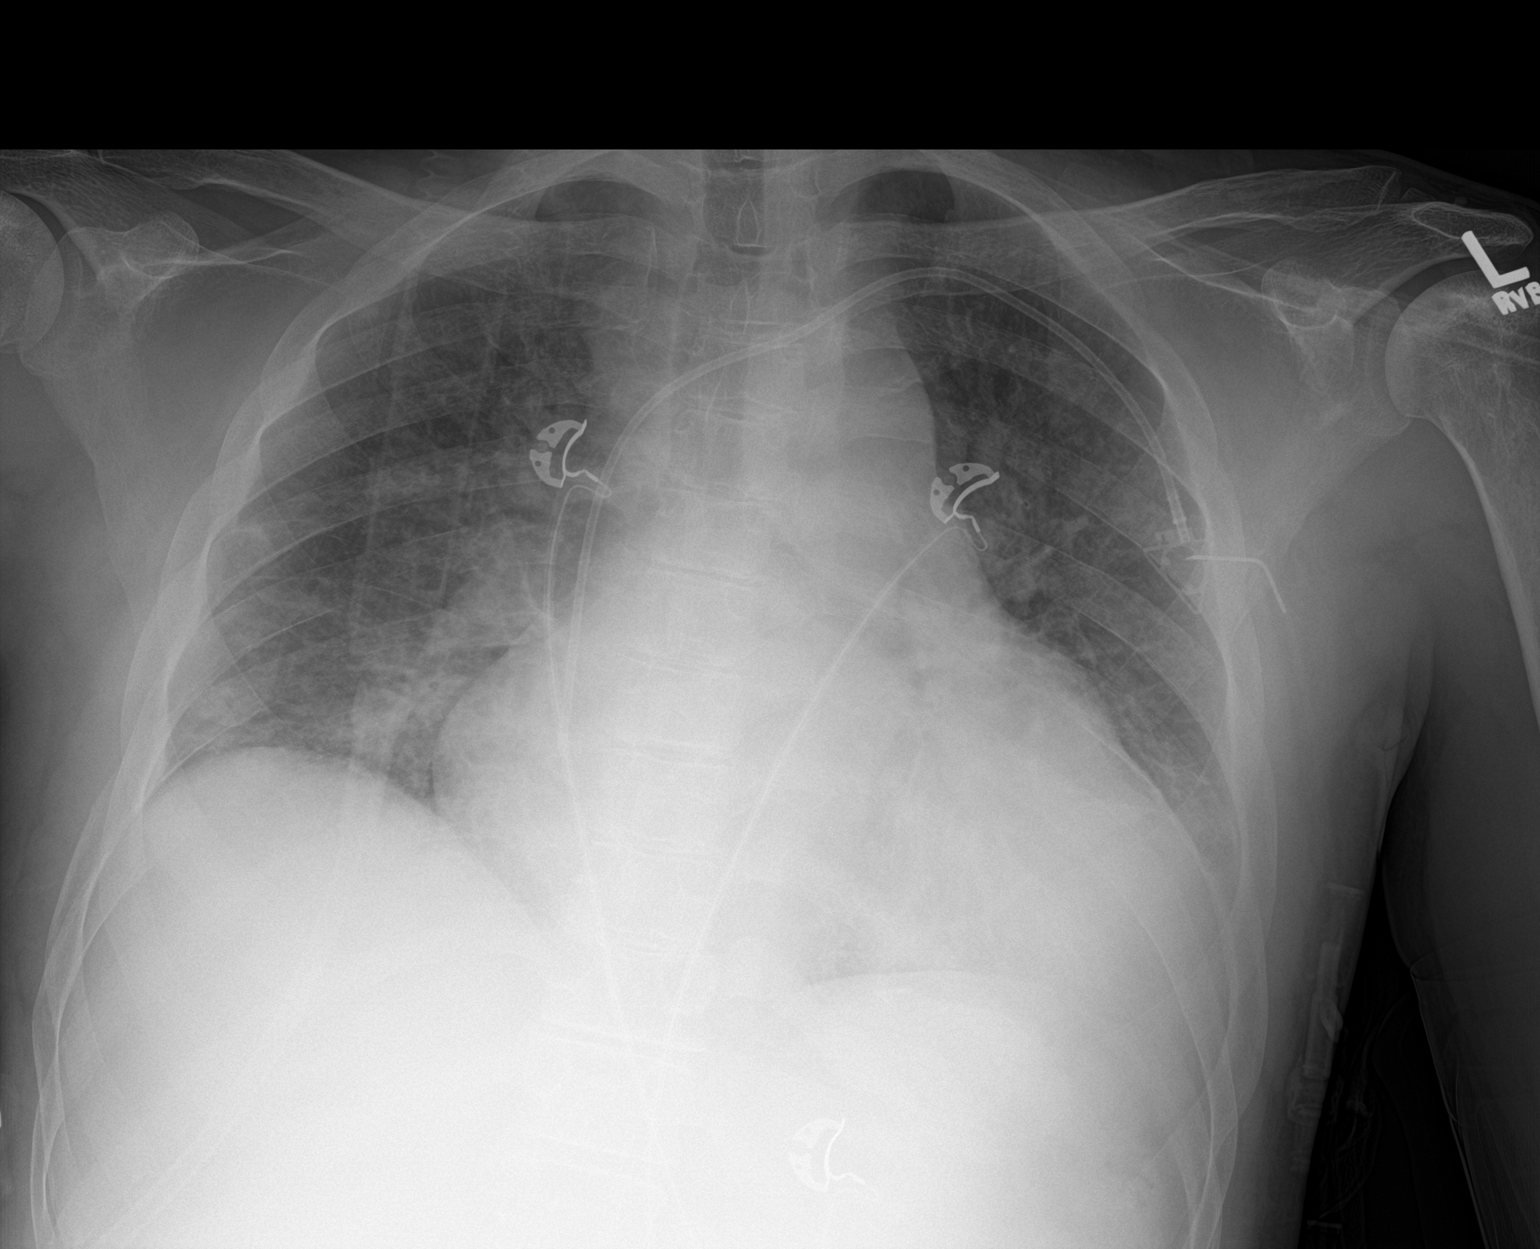

[1 of 1 positions shown; findings below may reference images not displayed]

FINDINGS: Stable enlarged cardiac silhouette. Increased density at the left
lateral lung base. Interval mild patchy and linear opacity at the
right lung base. Left subclavian porta catheter tip in the right
atrium. No acute bony abnormality. Previously noted avascular
necrosis of the left humeral head.
IMPRESSION: 1. Interval atelectasis or pneumonia at the left lateral lung base.
2. Interval mild atelectasis and possible pneumonia at the right
lung base.
3. Stable cardiomegaly.
4. Left subclavian porta catheter tip in the right atrium. This
could be retracted 4.5 cm to place it in the superior vena cava.

## 2019-03-16 IMAGING — DX DG CHEST 2V
2 series · 2 of 2 positions shown · non-contrast
Comparison: Chest x-rays dated 04/17/2016 and 02/11/2016.

CLINICAL DATA: Sickle cell pain crisis, shortness of breath today.
History of hypertension.

EXAM:
CHEST  2 VIEW

[chest pa]
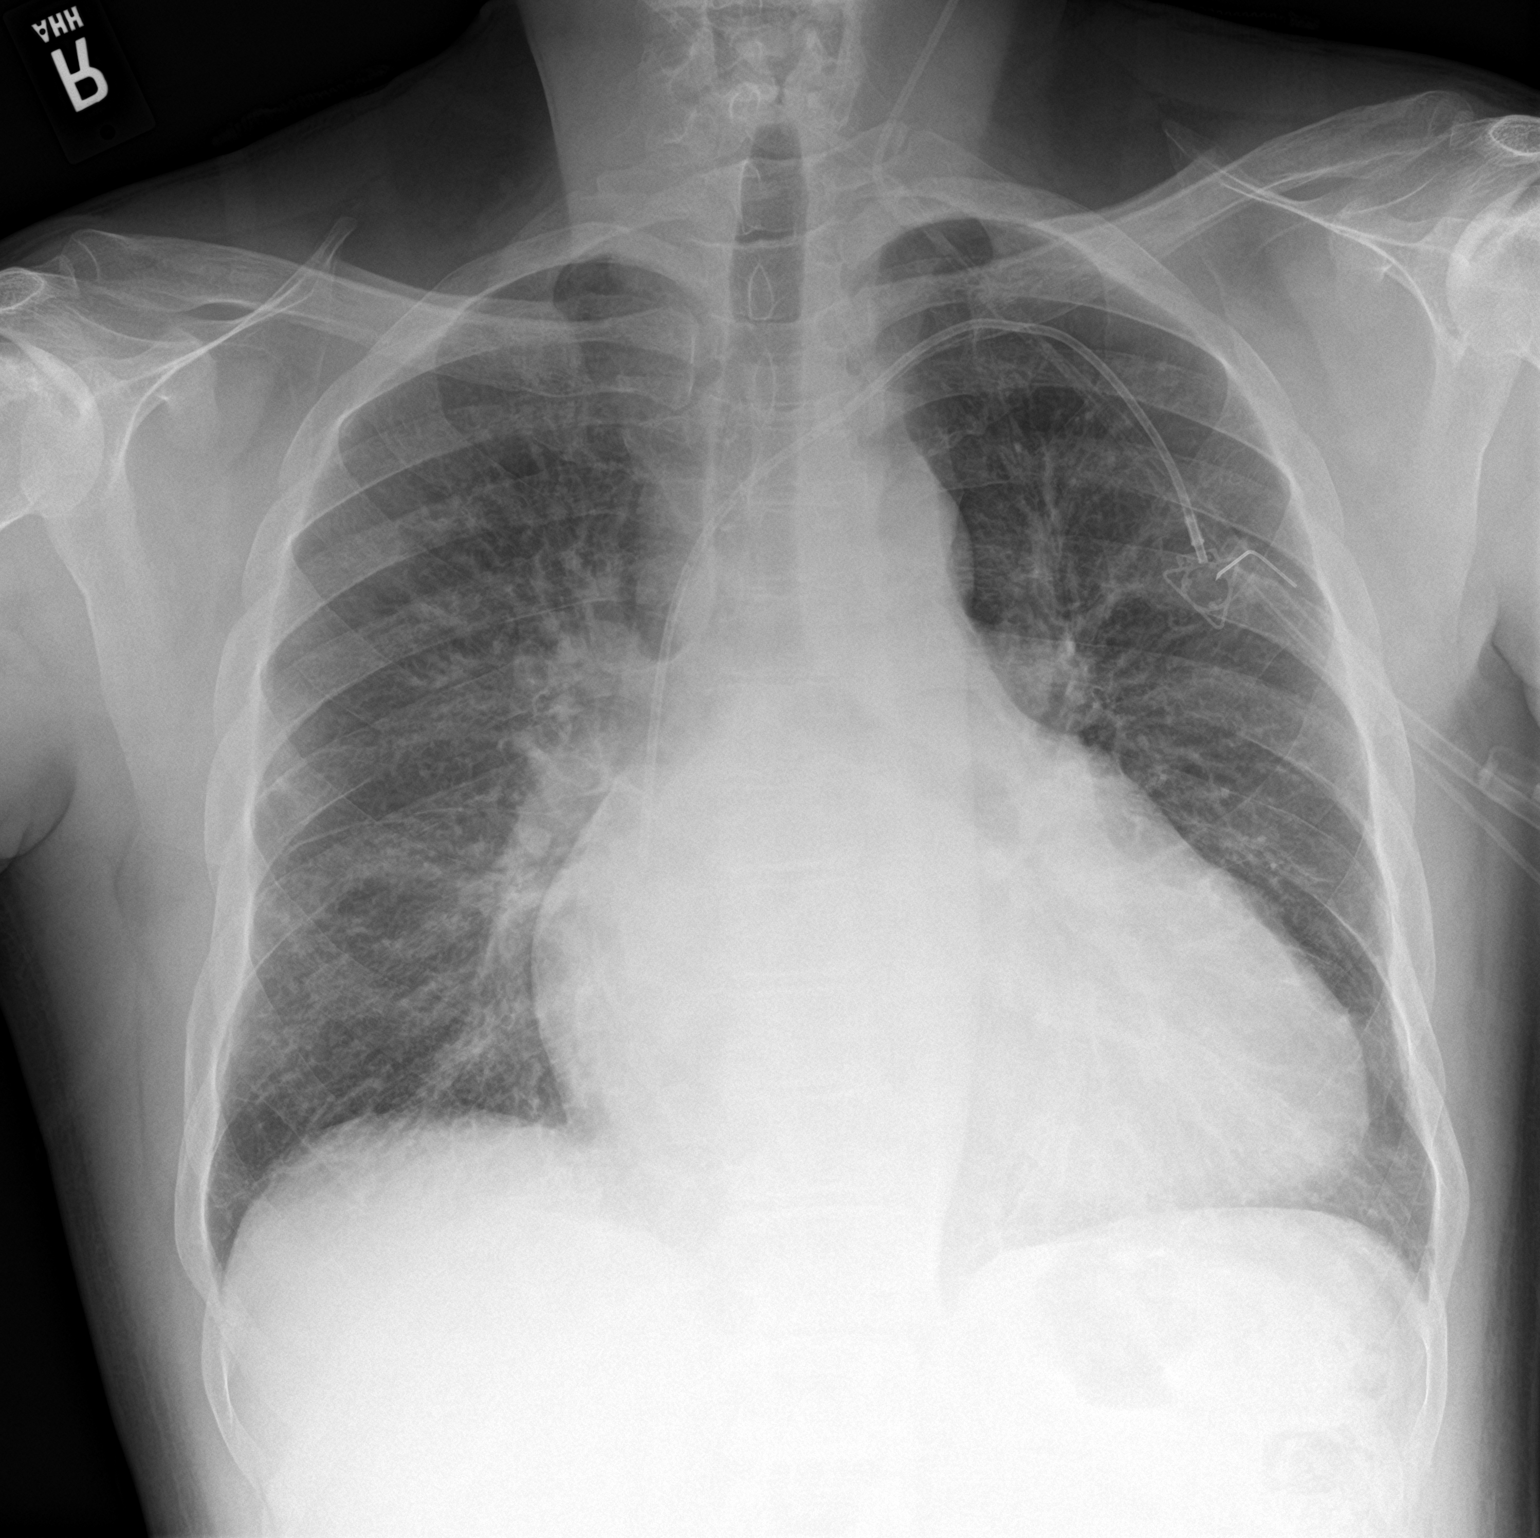

[chest lat]
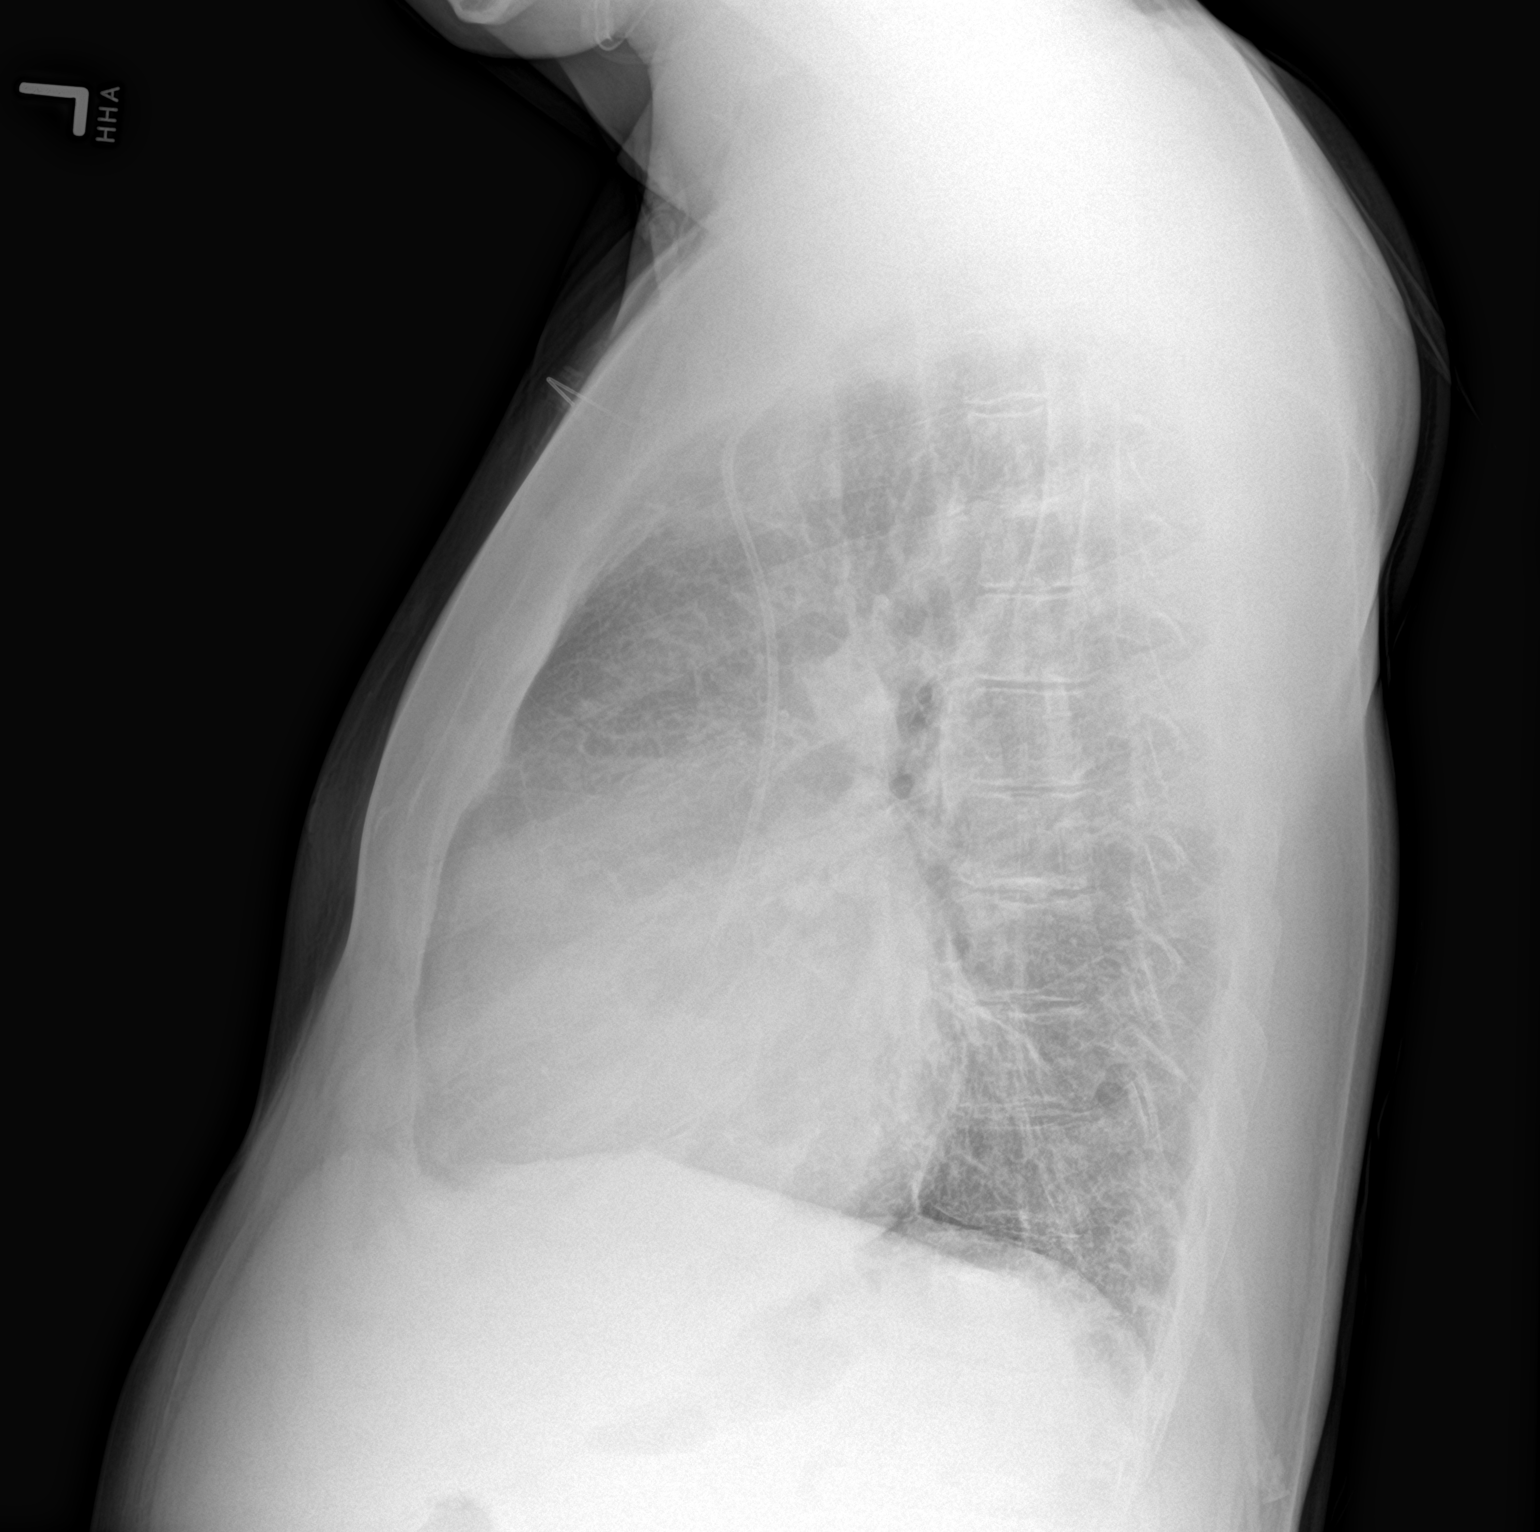

[2 of 2 positions shown; findings below may reference images not displayed]

FINDINGS: Cardiomegaly is stable. There is central pulmonary vascular
congestion. Prominent interstitial markings again noted bilaterally
suggesting associated interstitial edema. No evidence of overt
alveolar pulmonary edema. No confluent opacity to suggest a
developing pneumonia. No pleural effusion or pneumothorax seen.

Left chest wall Port-A-Cath is stable in position with tip at the
level of the lower SVC. No acute or suspicious osseous finding.
IMPRESSION: Cardiomegaly with central pulmonary vascular congestion and
bilateral interstitial edema suggesting volume overload/CHF. No
evidence of pneumonia.

## 2019-03-23 IMAGING — CR DG CHEST 2V
2 series · 2 of 2 positions shown · non-contrast
Comparison: Chest radiograph performed 04/22/2016

CLINICAL DATA: Acute onset of central chest throbbing and rib pain.
Initial encounter.

EXAM:
CHEST  2 VIEW

[w chest pa]
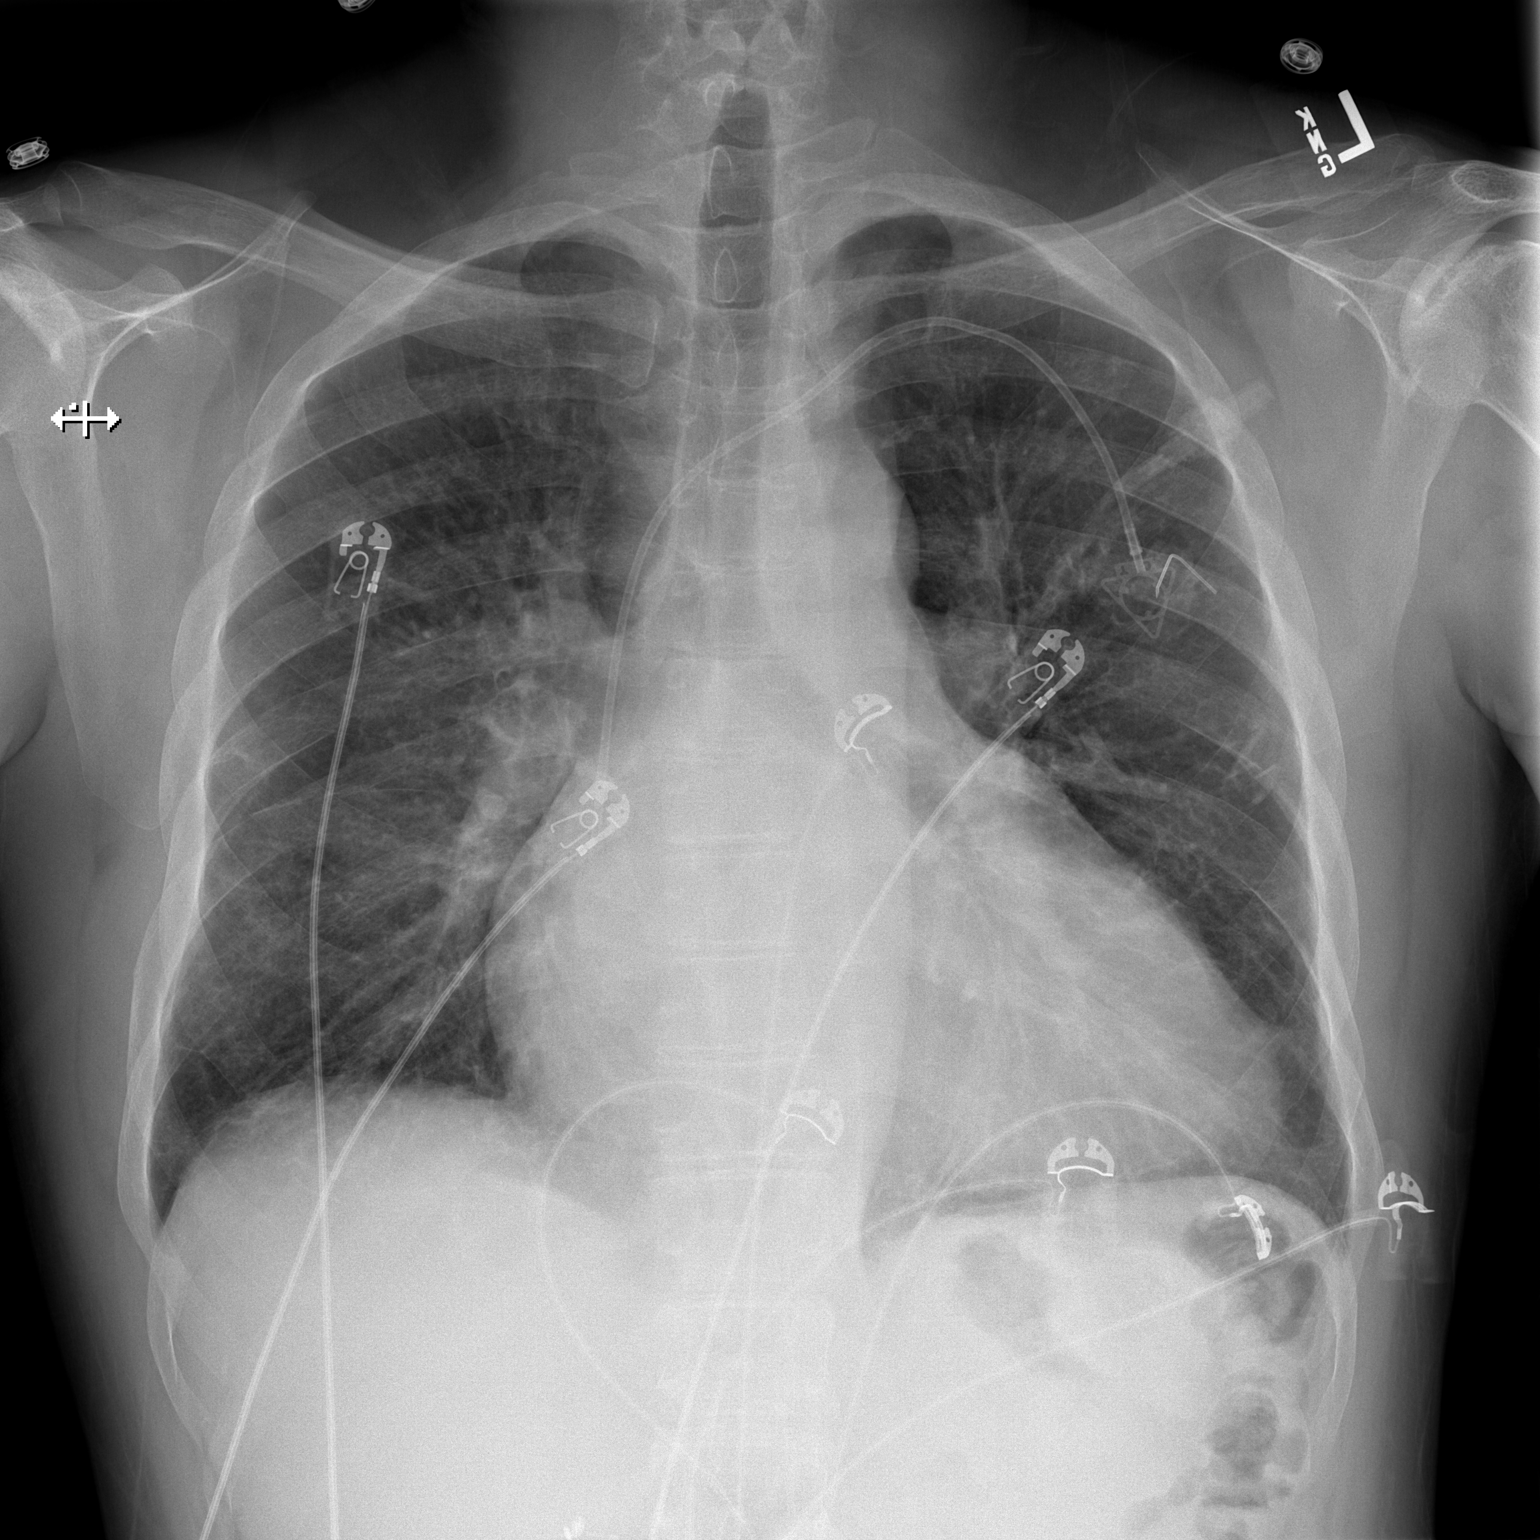

[w chest lat]
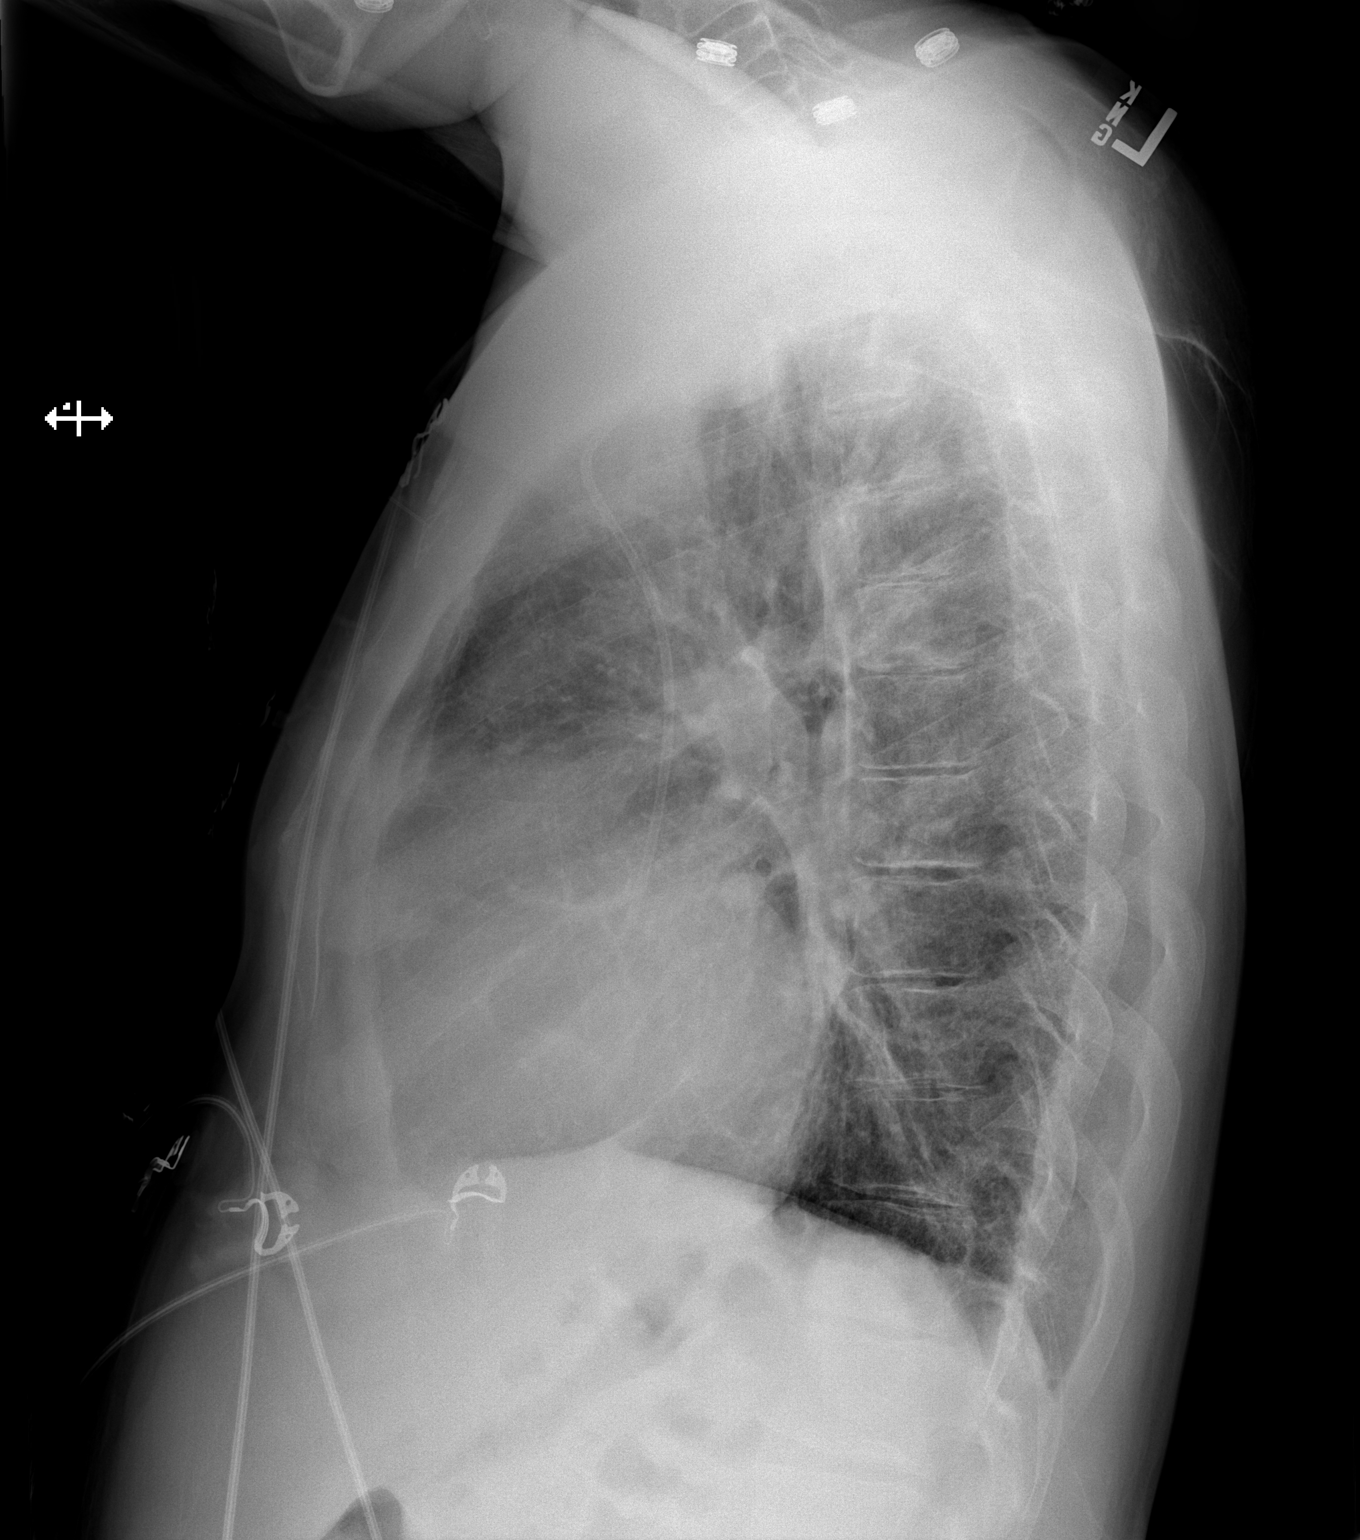

[2 of 2 positions shown; findings below may reference images not displayed]

FINDINGS: The lungs are well-aerated. Mild vascular congestion is noted. There
is no evidence of focal opacification, pleural effusion or
pneumothorax.

The heart is enlarged. A left-sided chest port noted ending about
the distal SVC. No acute osseous abnormalities are seen. Clips are
noted within the right upper quadrant, reflecting prior
cholecystectomy.
IMPRESSION: Mild vascular congestion and cardiomegaly. Lungs remain grossly
clear.

## 2019-03-30 IMAGING — CT CT ANGIO CHEST
2 of 6 series · 18 of 46 positions shown · IV contrast (isovue)
Comparison: CXR 04/29/2016, chest CT 03/13/2016

CLINICAL DATA: Hypoxia, history of sickle cell

EXAM:
CT ANGIOGRAPHY CHEST WITH CONTRAST
TECHNIQUE: Multidetector CT imaging of the chest was performed using the
standard protocol during bolus administration of intravenous
contrast. Multiplanar CT image reconstructions and MIPs were
obtained to evaluate the vascular anatomy.
CONTRAST:  75 cc of Isovue 370 IV

[Series 5: thins · axial · 0.83mm/px · z∈[+1390,+1710]mm · 16 of 352 slices shown]
[im 16/352  lung]
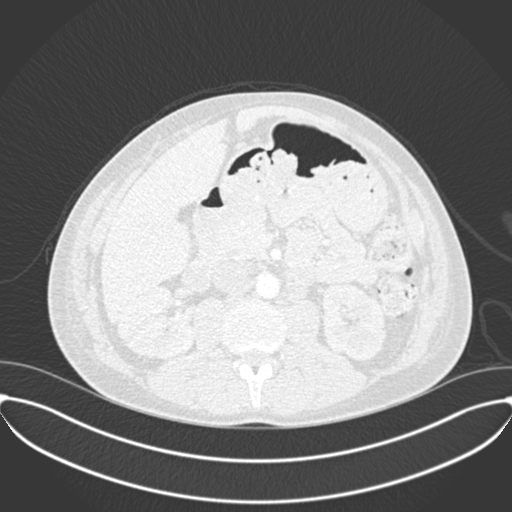
[im 46/352  soft-tissue]
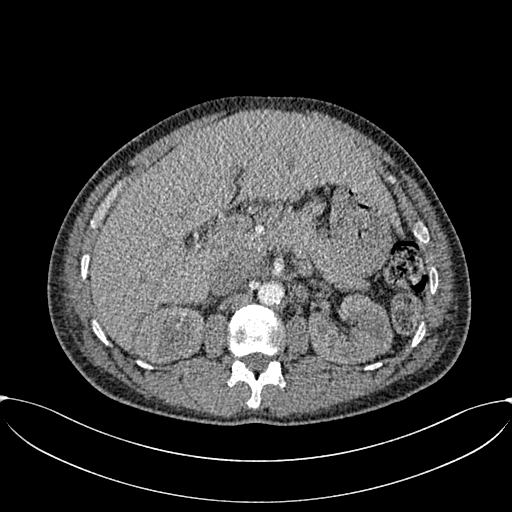
[im 62/352  lung]
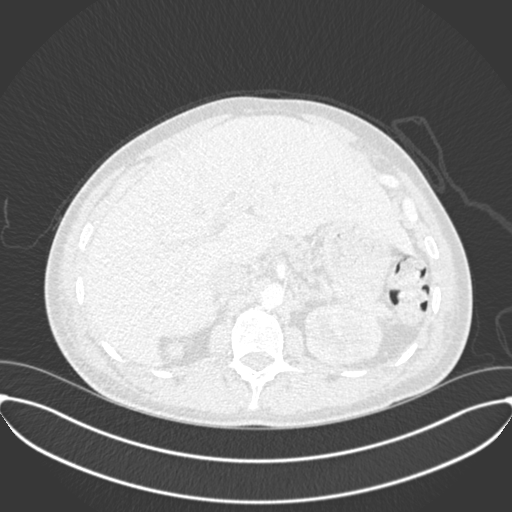
[im 77/352  soft-tissue]
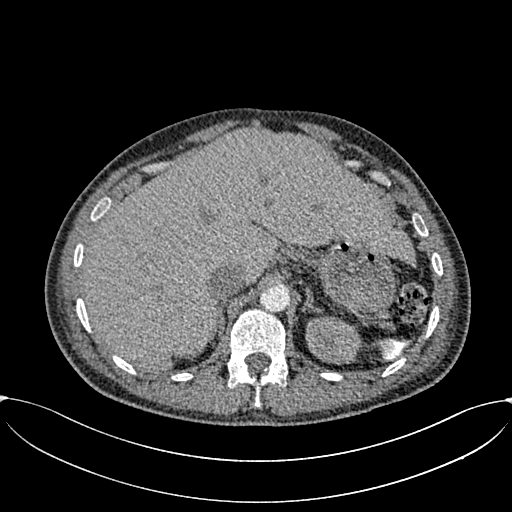
[im 107/352  lung]
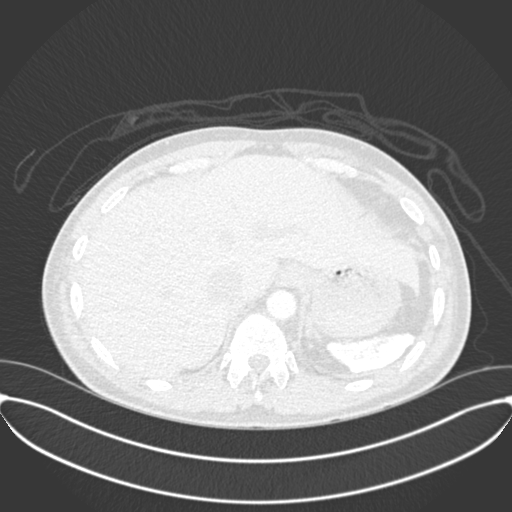
[im 123/352  soft-tissue]
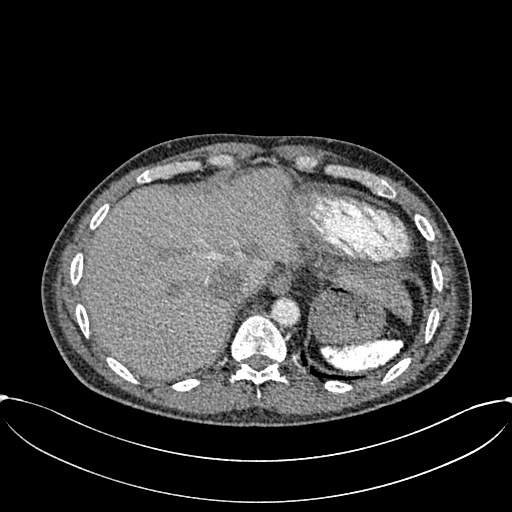
[im 138/352  lung]
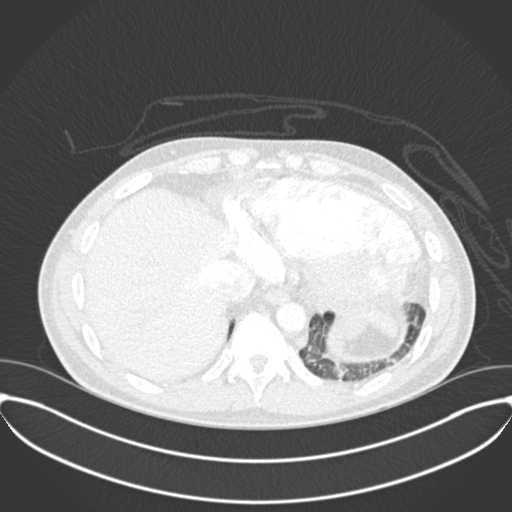
[im 168/352  soft-tissue]
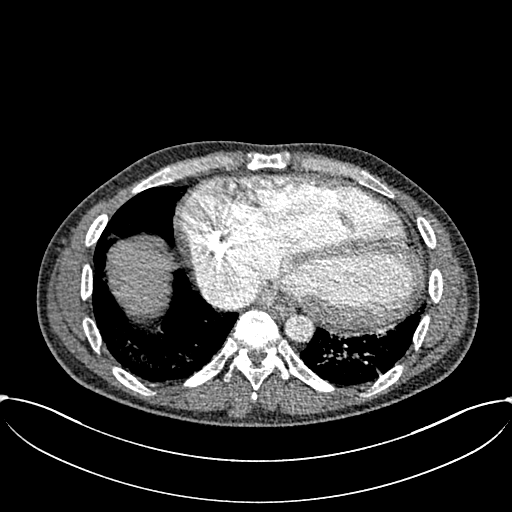
[im 184/352  lung]
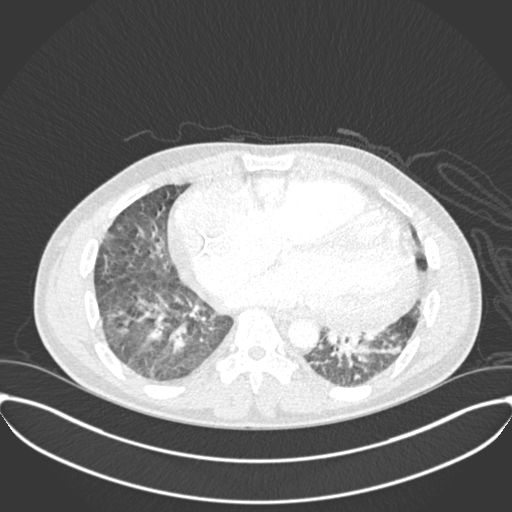
[im 214/352  soft-tissue]
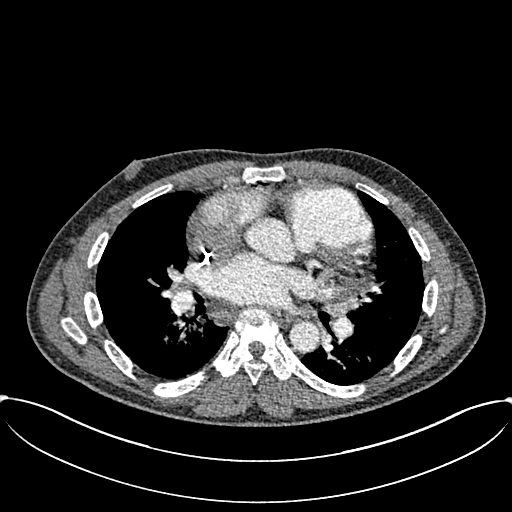
[im 229/352  lung]
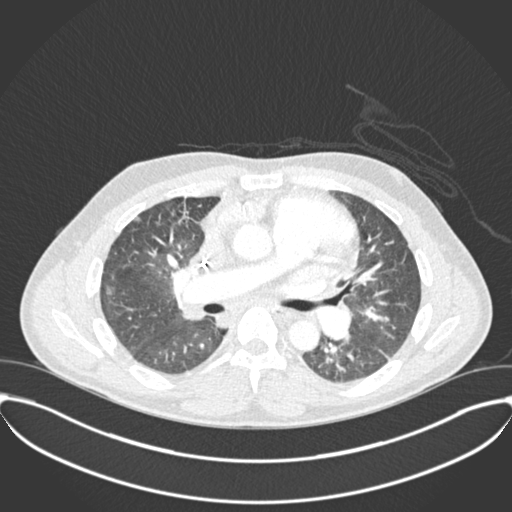
[im 245/352  soft-tissue]
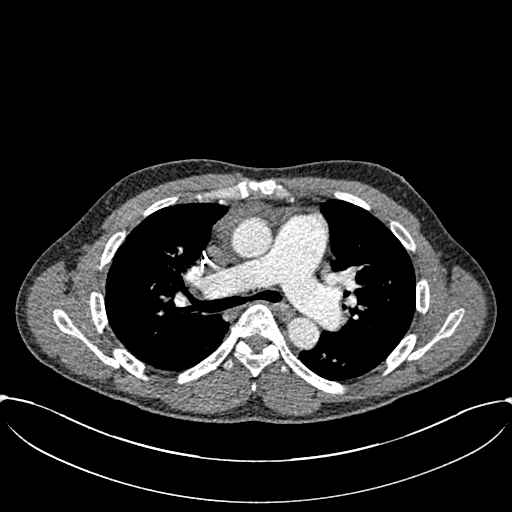
[im 275/352  lung]
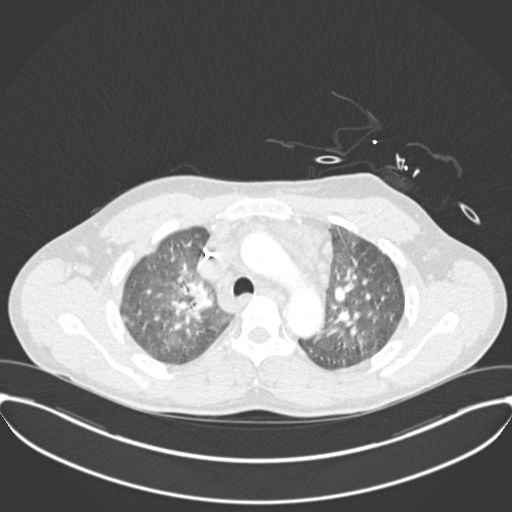
[im 290/352  soft-tissue]
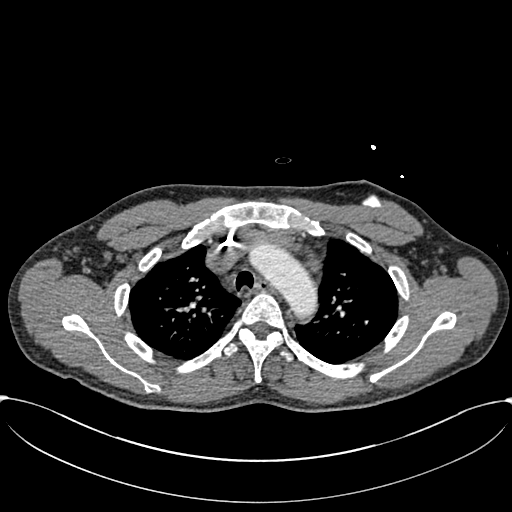
[im 306/352  lung]
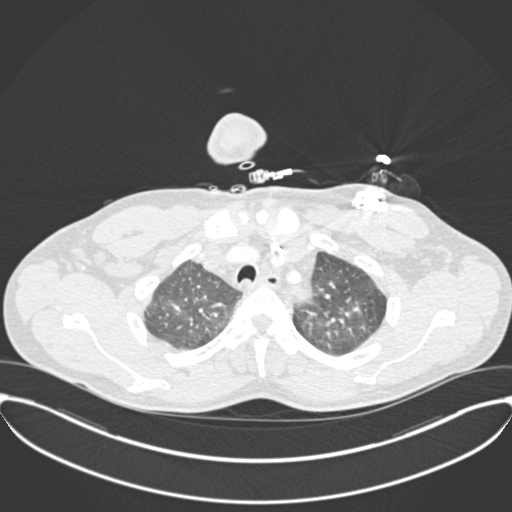
[im 336/352  soft-tissue]
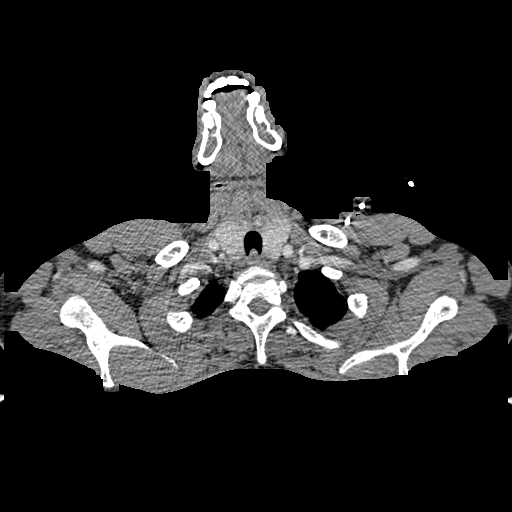

[Series 6: coronal mpr · coronal · 0.69mm/px · 2 of 106 slices shown]
[im 36/106  soft-tissue]
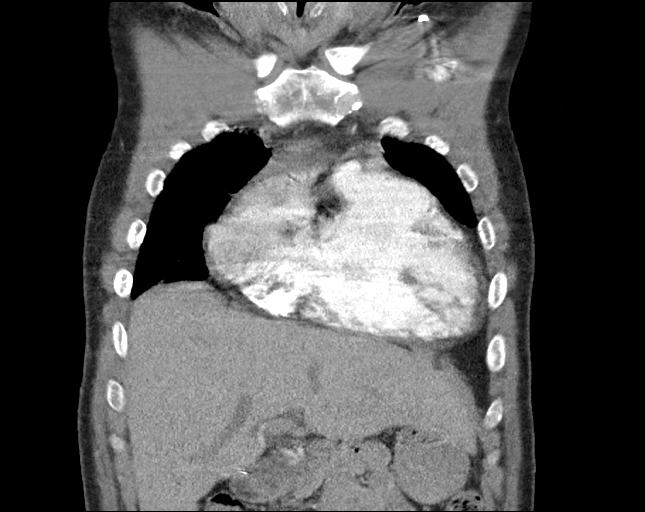
[im 71/106  soft-tissue]
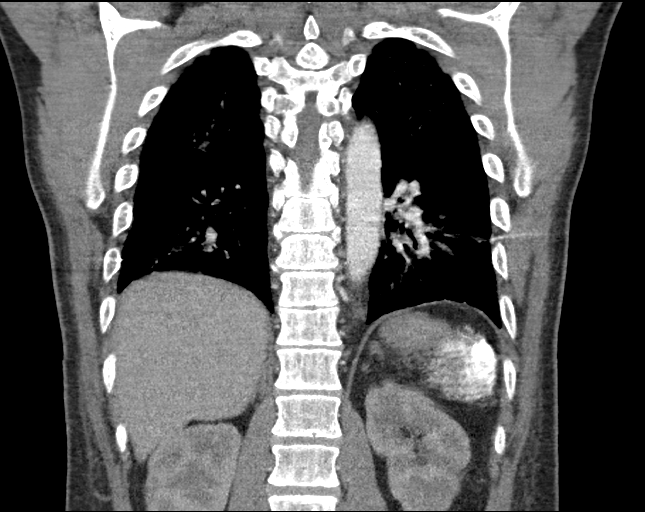

[18 of 46 positions shown; findings below may reference images not displayed]

FINDINGS: Cardiovascular: Cardiomegaly. Port catheter tip in the cavoatrial
junction. Small pericardial effusion. No acute pulmonary embolus. To
the third order branches. No aortic aneurysm or dissection.

Mediastinum/Nodes: Mild retrosternal extension of the thyroid gland
without focal mass. Trachea and esophagus demonstrate no acute
findings. Multiple small prevascular, paratracheal and hilar nodes
are again seen without pathologic appearing enlargement.

Lungs/Pleura: Perihilar distribution of ground-glass opacities
suspicious for mild pulmonary edema or alveolitis secondary to
infection or inflammation. No significant pleural effusion or
pneumothorax.

Upper Abdomen: No acute abnormality in the upper abdomen.
Autoinfarcted spleen densely calcified in appearance and shrunken in
appearance.

Musculoskeletal: Central endplate compressions of several mid
thoracic vertebrae consistent with history of sickle cell. No acute
osseous appearing abnormality.

Review of the MIP images confirms the above findings.
IMPRESSION: Stable cardiomegaly with mild perihilar ground-glass opacities
suspicious for pulmonary edema with differential possibilities
including alveolitis possibly from an infectious or inflammatory
process.

No acute pulmonary embolus.

Auto infarcted spleen.

H- shaped vertebrae in the mid thoracic spine consistent with
history of sickle cell.

## 2019-04-07 IMAGING — DX DG CHEST 1V PORT
1 series · 1 of 1 positions shown · non-contrast
Comparison: 04/29/2016 and prior exams

CLINICAL DATA: Acute chest pain.  History of sickle cell anemia.

EXAM:
PORTABLE CHEST 1 VIEW

[chest ap]
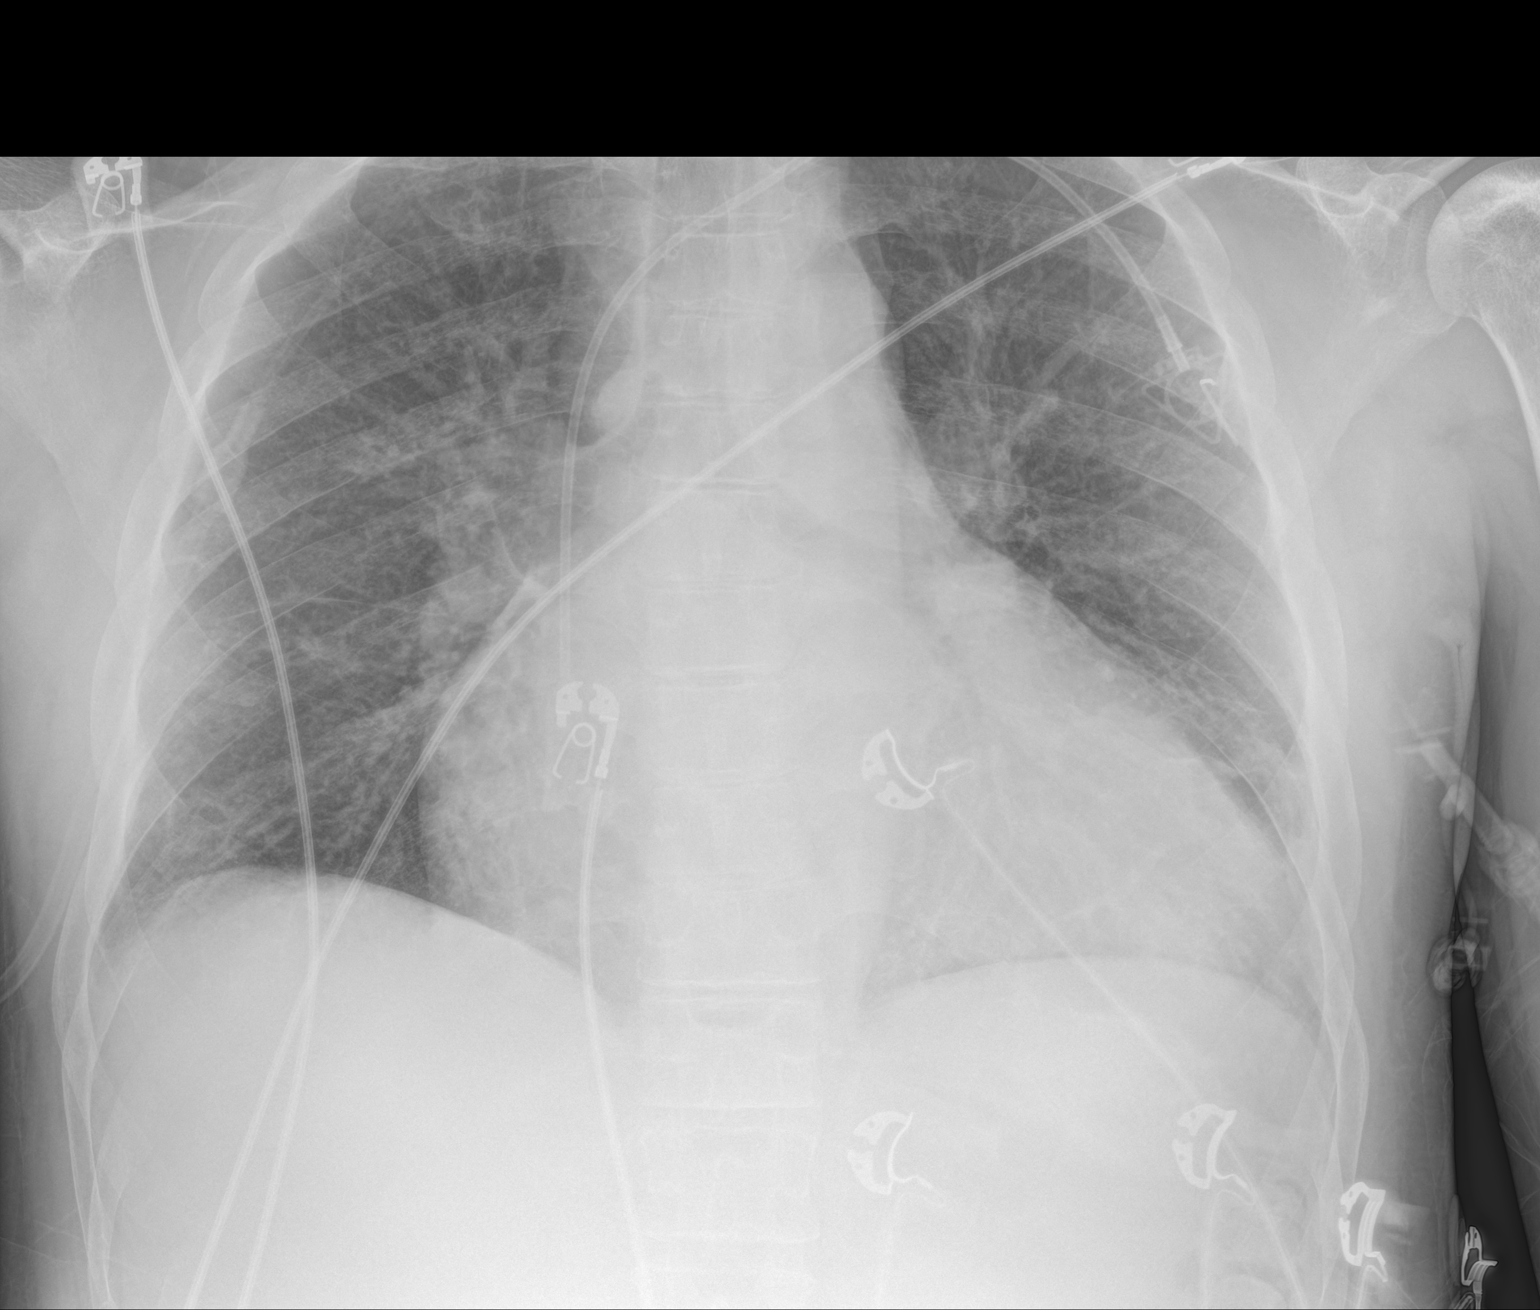

[1 of 1 positions shown; findings below may reference images not displayed]

FINDINGS: Cardiomegaly and mild vascular congestion noted.

A left Port-A-Cath is present with tip overlying the superior
cavoatrial junction.

There is no evidence of focal airspace disease, pulmonary edema,
suspicious pulmonary nodule/mass, pleural effusion, or pneumothorax.

No acute bony abnormalities are identified. Left humeral head AVN
again noted.
IMPRESSION: Cardiomegaly and mild pulmonary vascular congestion.

## 2019-04-21 IMAGING — CR DG CHEST 2V
2 series · 2 of 2 positions shown · non-contrast
Comparison: Chest radiograph dated 05/14/2016

CLINICAL DATA: 36-year-old male with sickle cell anemia presenting
with bilateral rib pain.

EXAM:
CHEST  2 VIEW

[w chest pa]
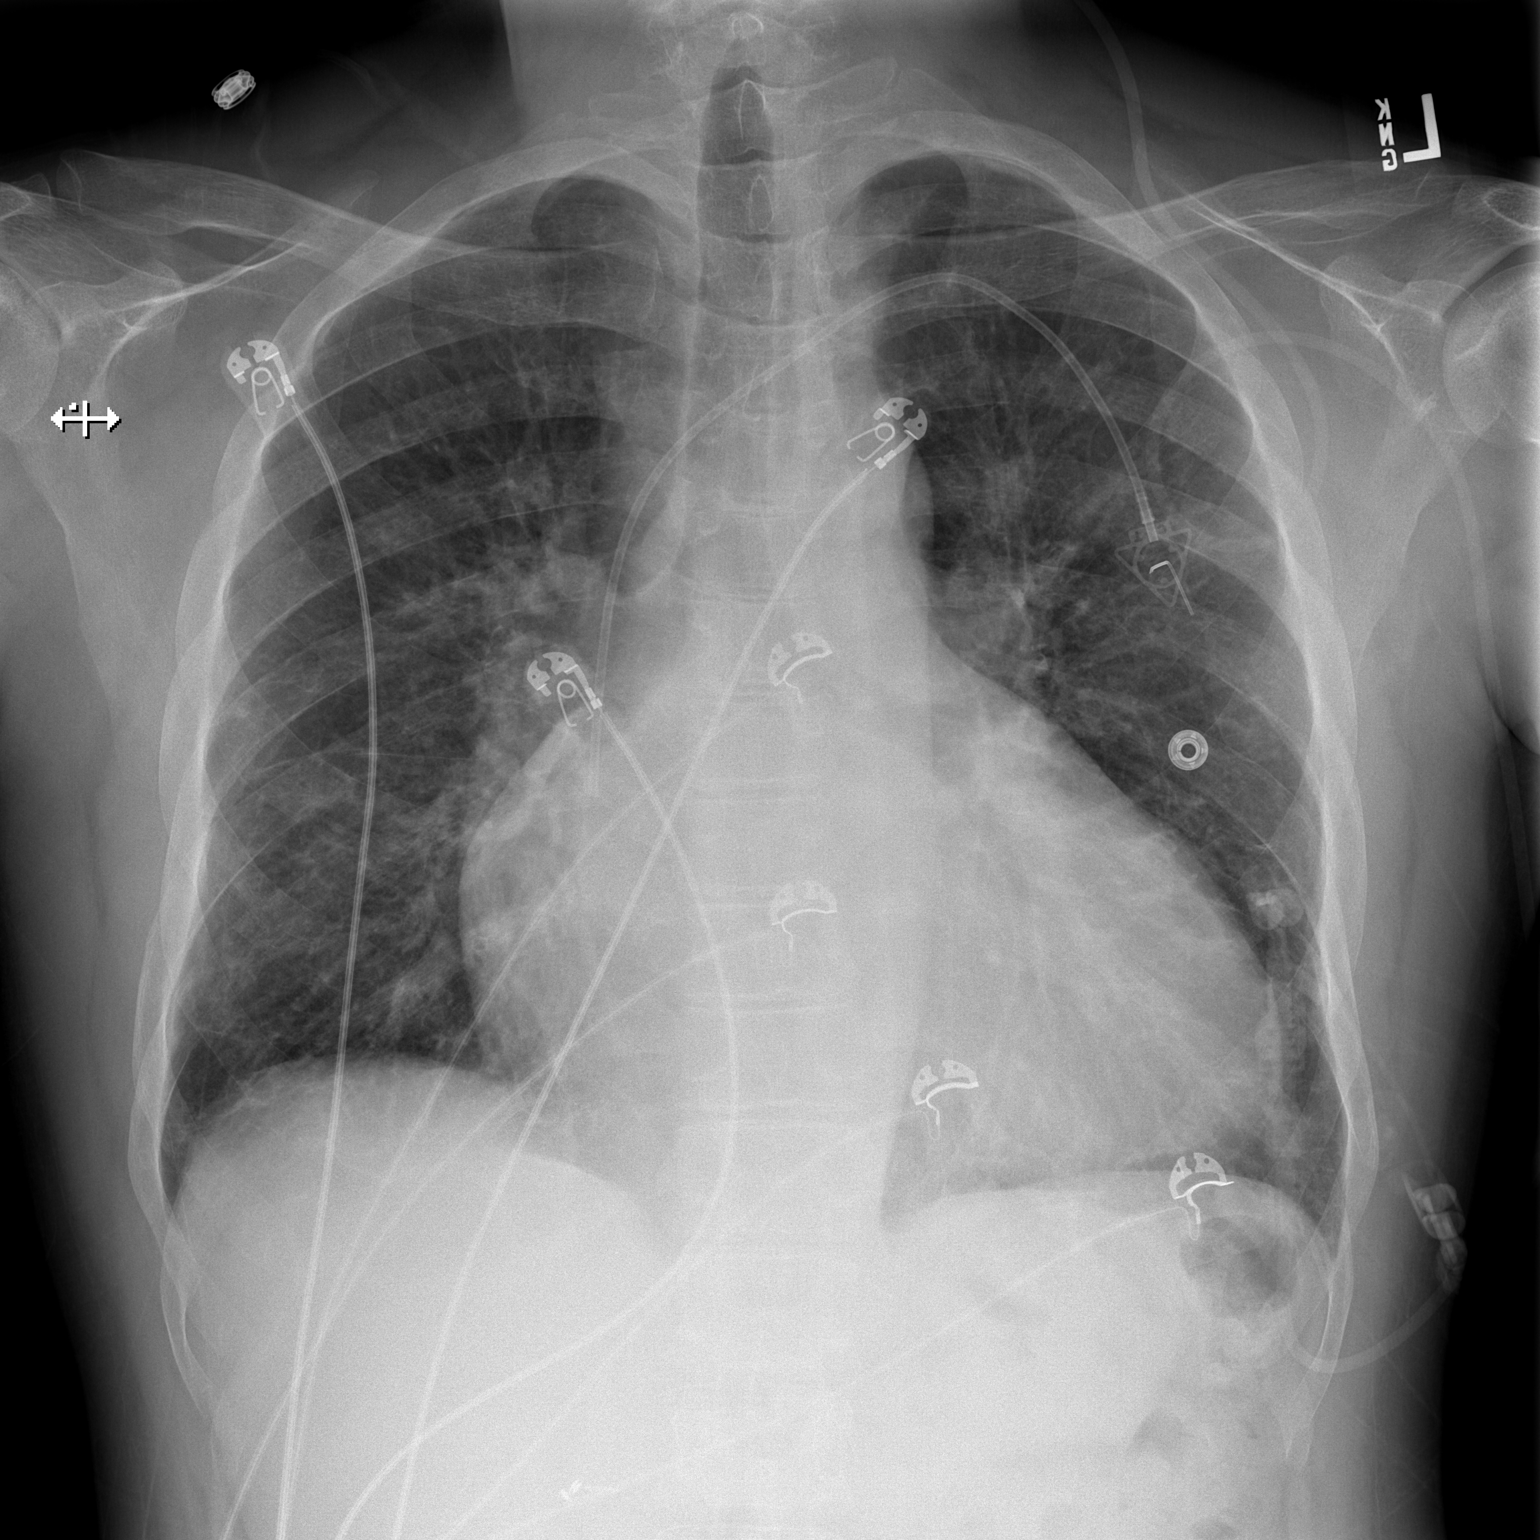

[w chest lat]
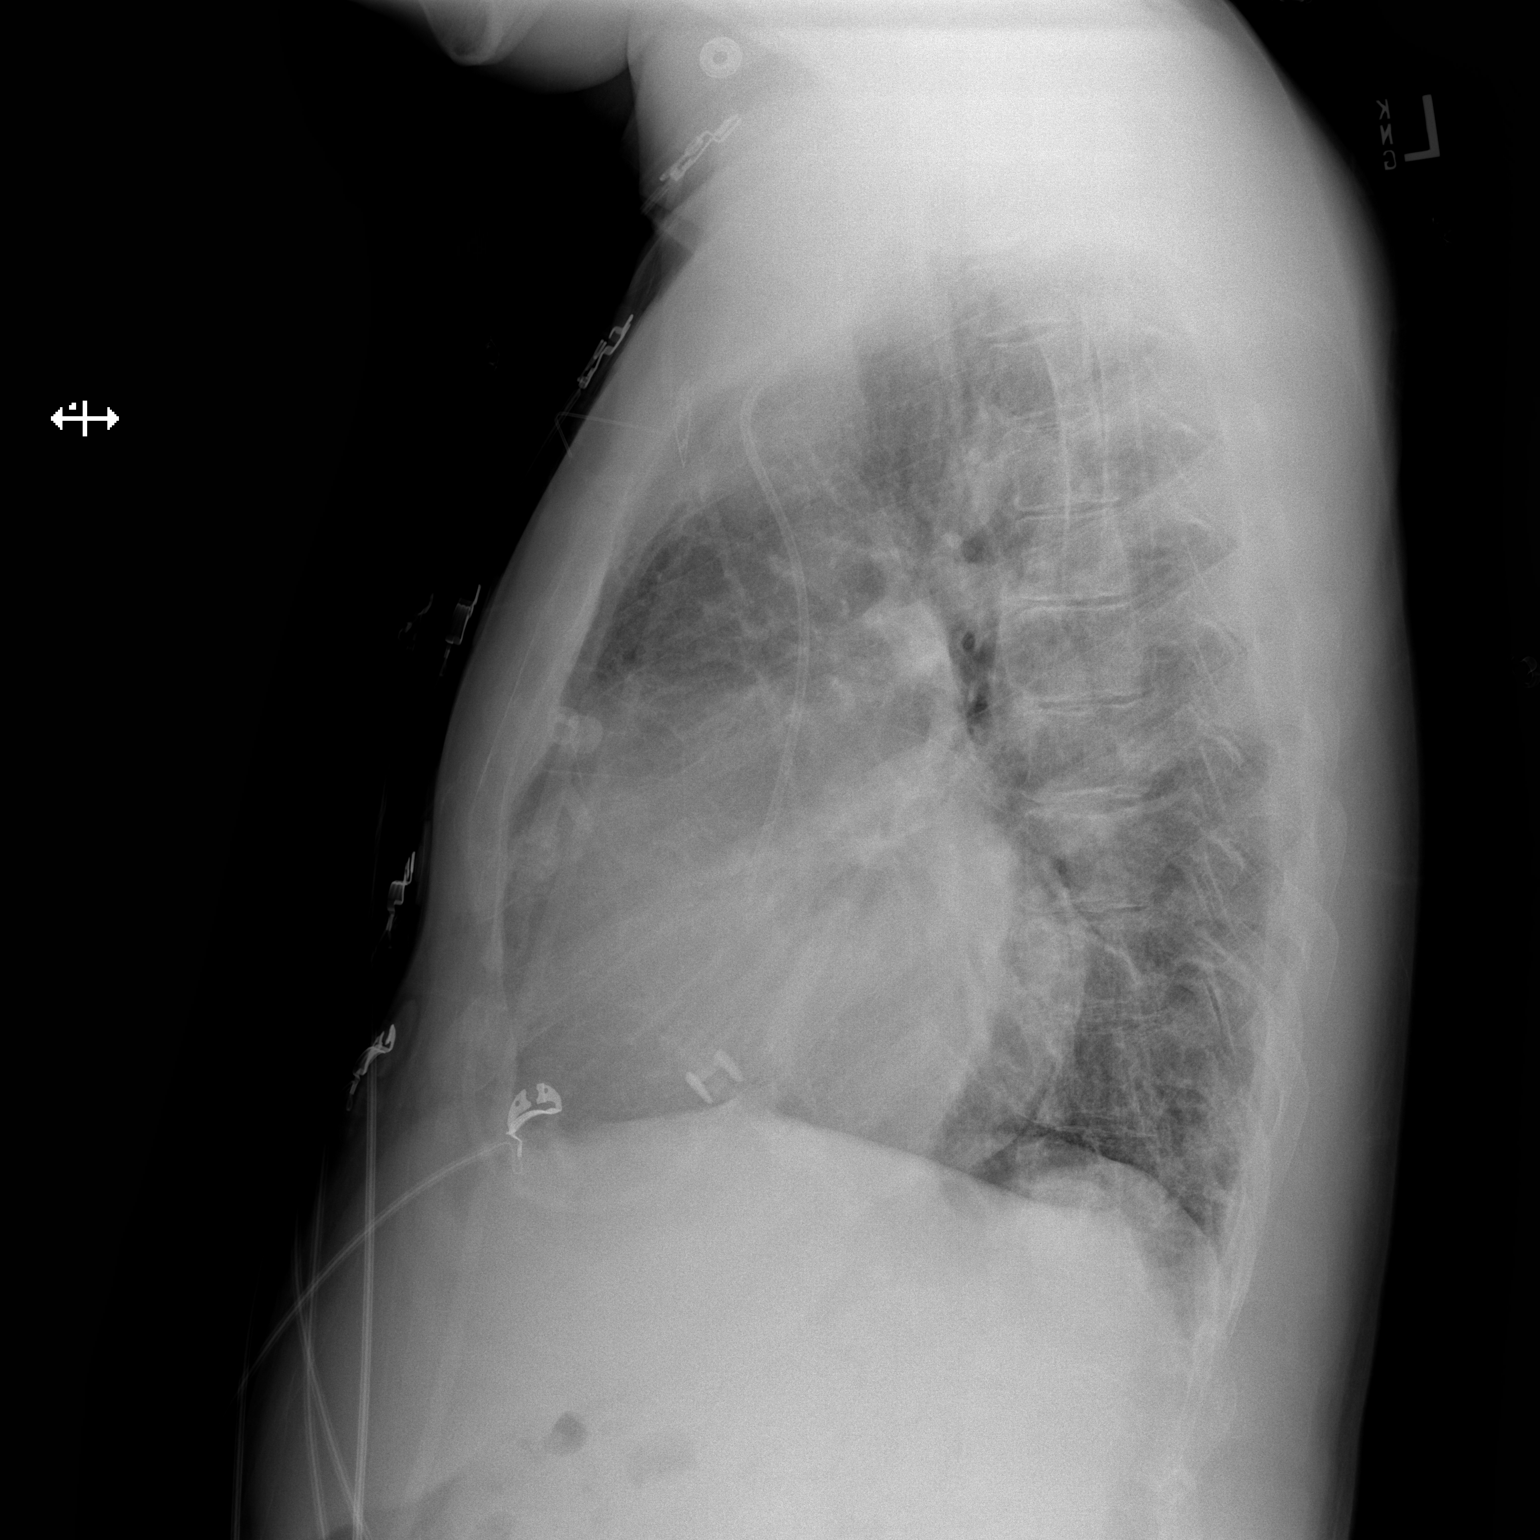

[2 of 2 positions shown; findings below may reference images not displayed]

FINDINGS: Left pectoral subclavian approach infusion catheter with tip over
the right atrial silhouette adjacent to the cavoatrial junction.
There is stable cardiomegaly with mild vascular congestion. No new
consolidative changes. There is no pleural effusion or pneumothorax.
No acute osseous pathology.
IMPRESSION: 1. No focal consolidation.
2. Cardiomegaly with probable mild vascular congestion.

## 2019-04-30 IMAGING — CR DG CHEST 2V
2 series · 2 of 2 positions shown · non-contrast
Comparison: Radiographs 05/28/2016, most recent chest CT 05/06/2016

CLINICAL DATA: Rib pain. Shortness of breath today. History of
sickle cell anemia.

EXAM:
CHEST  2 VIEW

[w chest lat]
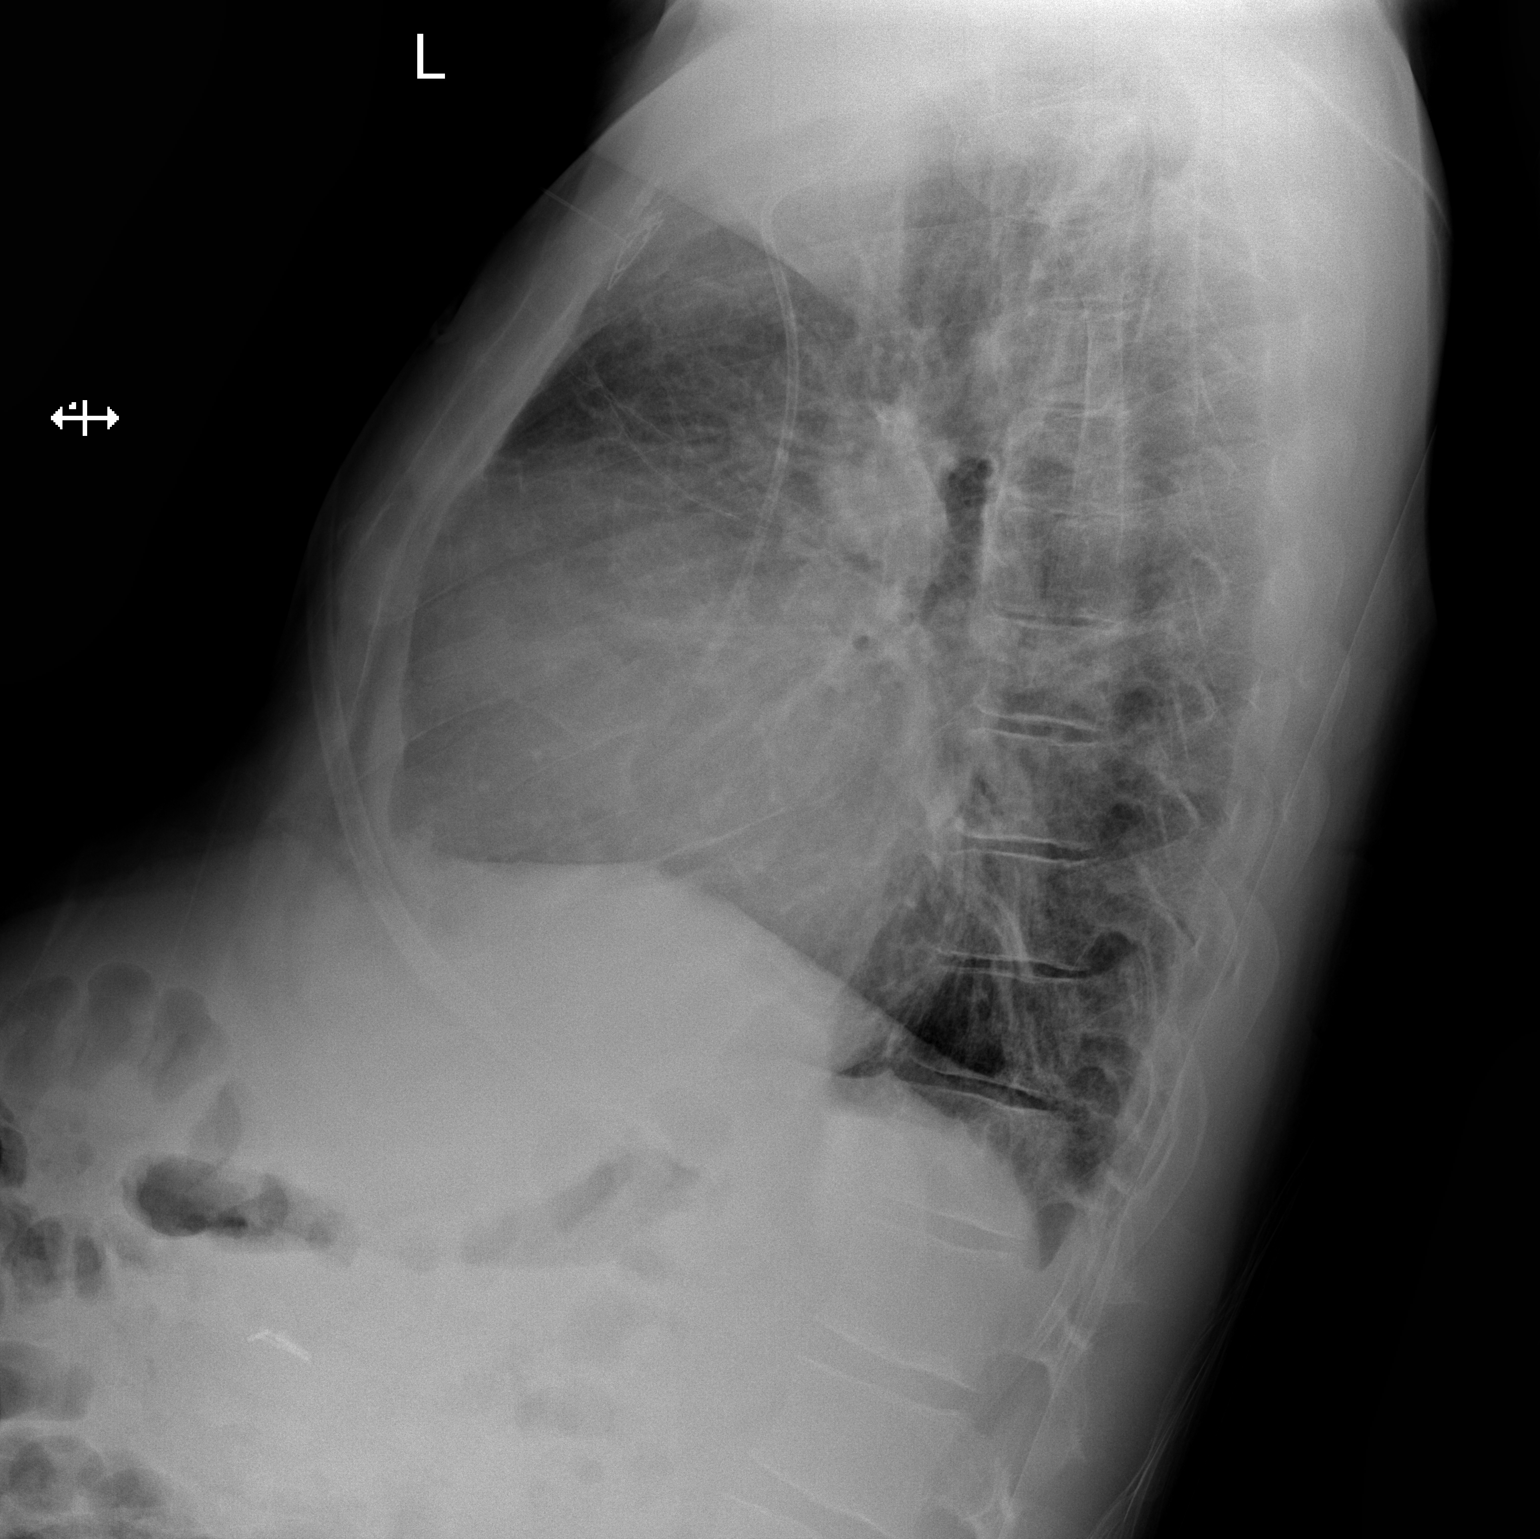

[x chest ap]
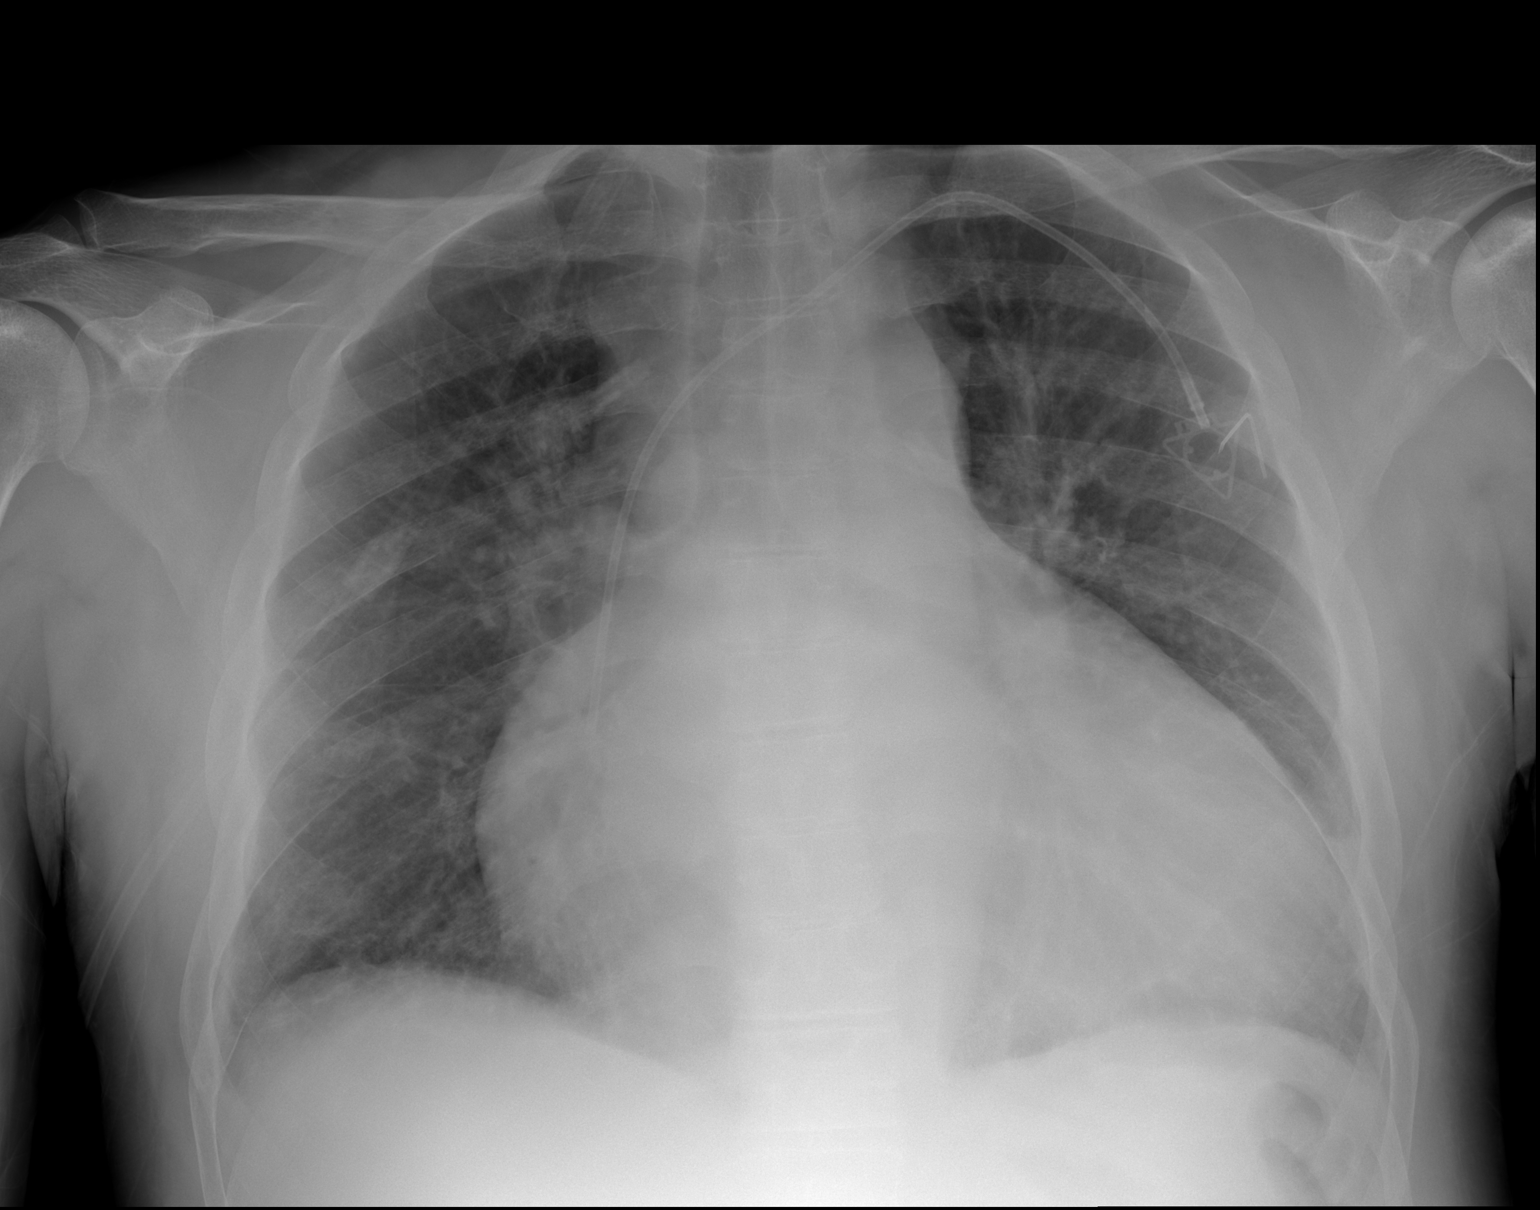

[2 of 2 positions shown; findings below may reference images not displayed]

FINDINGS: Left chest port remains in place. Cardiomegaly is similar to prior.
Question of worsening vascular congestion versus differences in AP
technique. No definite pleural effusion. No focal airspace disease.
No pneumothorax. No acute rib abnormalities identified
radiographically.
IMPRESSION: Similar cardiomegaly. Question of worsening vascular congestion
versus differences in technique.

No new focal airspace disease.

## 2019-05-20 IMAGING — DX DG CHEST 1V PORT
1 series · 1 of 1 positions shown · non-contrast
Comparison: 06/06/2016

CLINICAL DATA: Hypoxia, sickle cell

EXAM:
PORTABLE CHEST 1 VIEW

[chest ap]
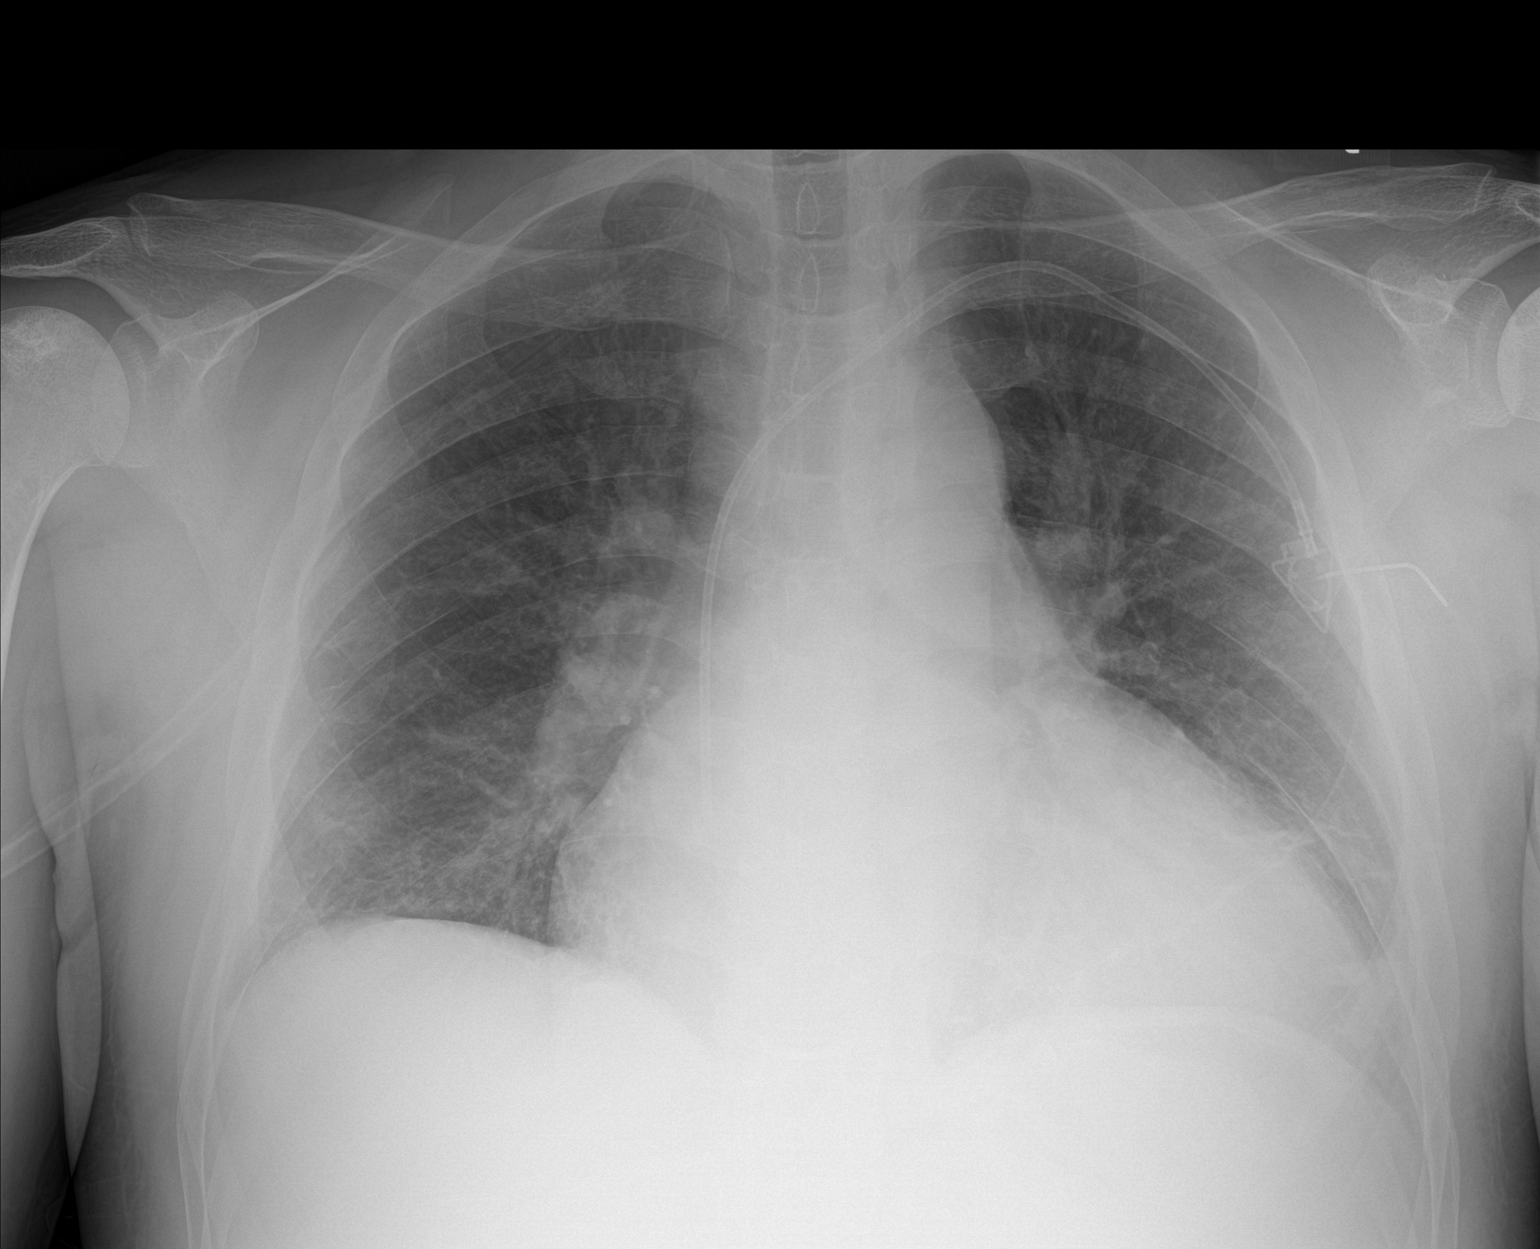

[1 of 1 positions shown; findings below may reference images not displayed]

FINDINGS: Mild lingular and right lower lobe opacities, atelectasis versus
pneumonia. No pleural effusion or pneumothorax.

Cardiomegaly.

Left chest power port terminates in the cavoatrial junction.
IMPRESSION: Mild lingular and right lower lobe opacities, atelectasis versus
pneumonia

## 2019-05-23 IMAGING — DX DG CHEST 2V
2 series · 2 of 2 positions shown · non-contrast
Comparison: 06/26/2016; 04/29/2016; chest CT - 05/06/2016

CLINICAL DATA: Sickle cell crisis with chest pain and shortness of
breath.

EXAM:
CHEST  2 VIEW

[chest pa]
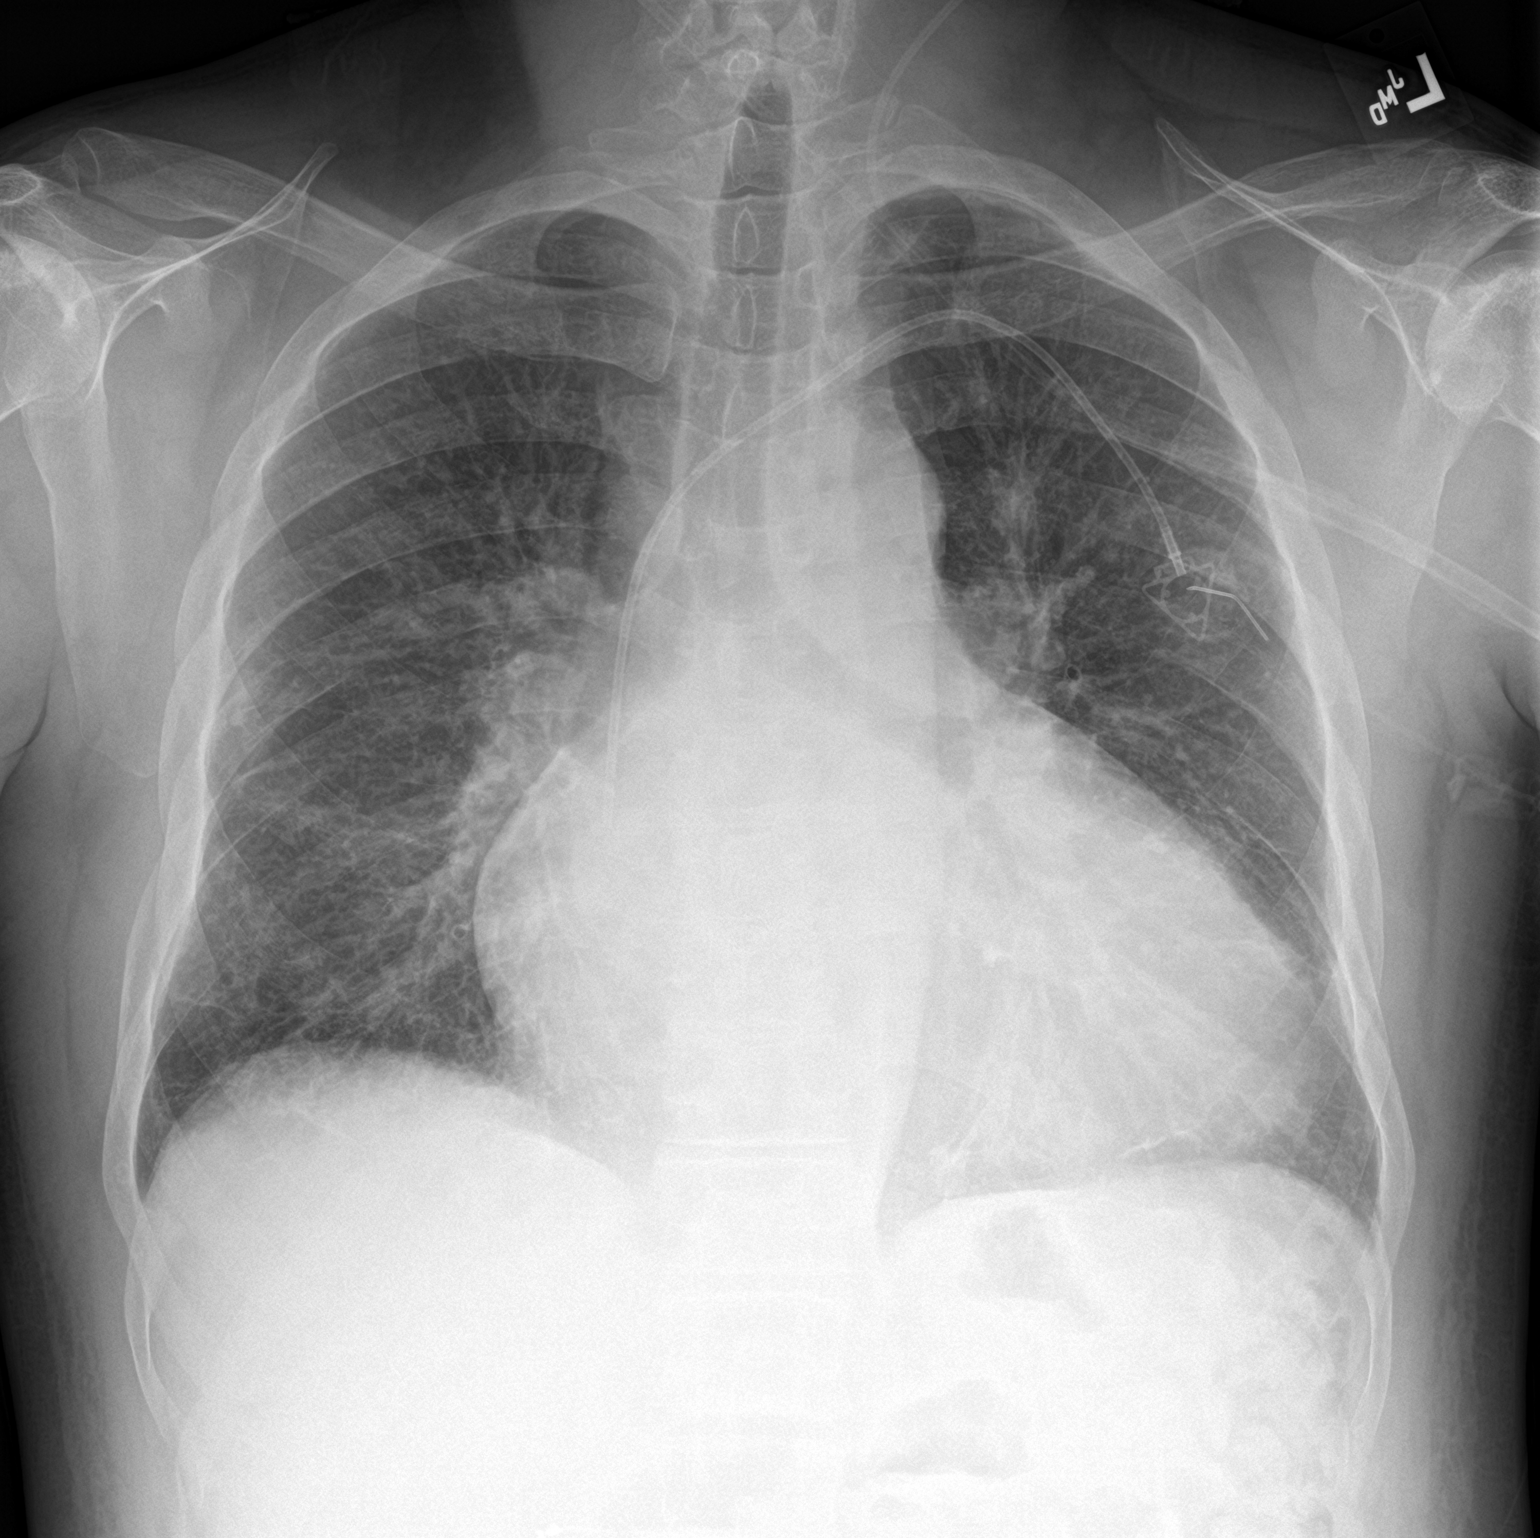

[chest lat]
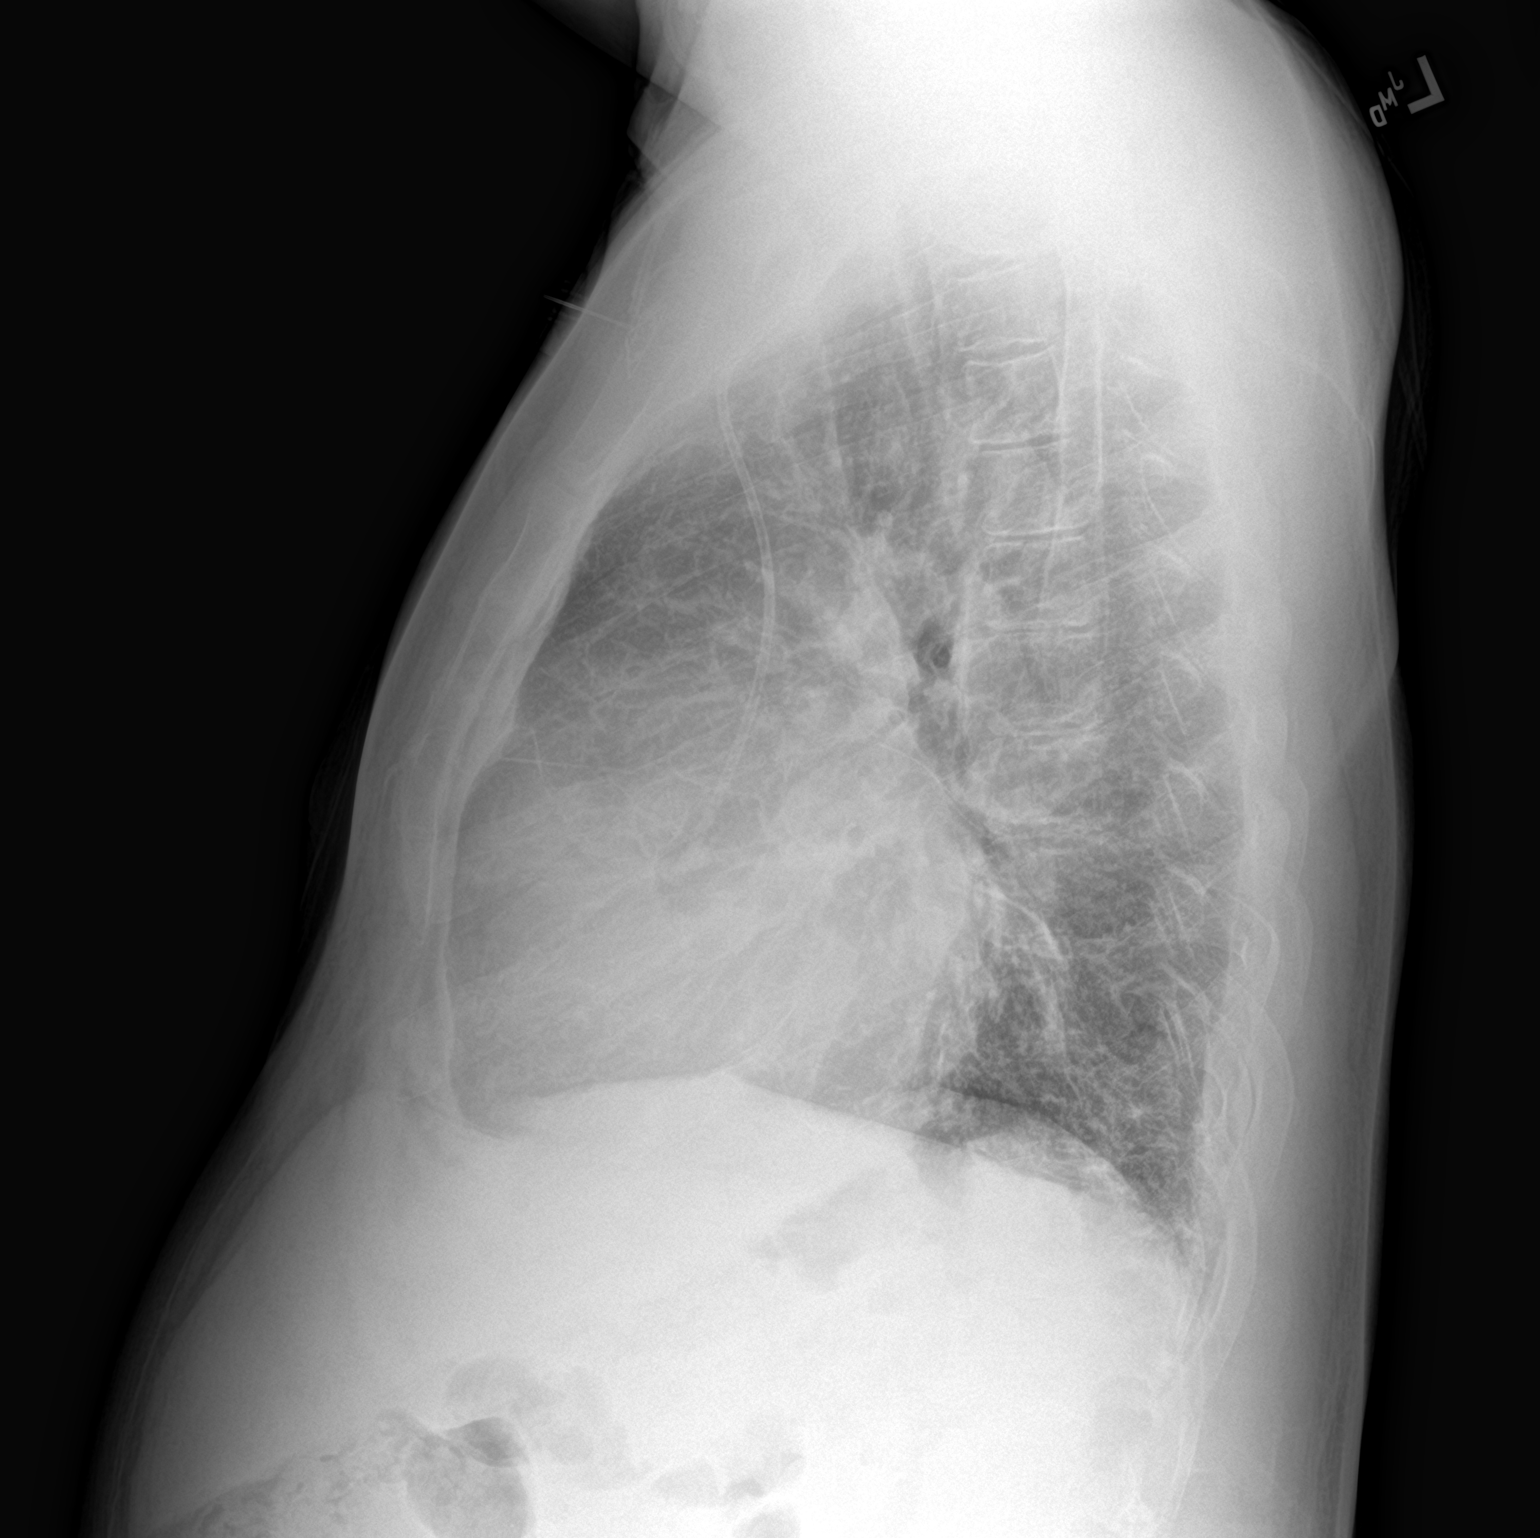

[2 of 2 positions shown; findings below may reference images not displayed]

FINDINGS: Grossly unchanged enlarged cardiac silhouette and mediastinal
contours. Stable position of support apparatus. Suspected slight
worsening of linear heterogeneous potential airspace opacities
within the right mid lung. Peripheral and basilar heterogeneous
opacities are unchanged. No pleural effusion or pneumothorax. No
evidence of edema. No acute osseus abnormalities.
IMPRESSION: Worsening ill-defined right mid lung heterogeneous opacities could
be indicative of early acute chest syndrome given provided history
of sickle cell disease.

## 2019-05-27 IMAGING — CR DG CHEST 2V
2 series · 2 of 2 positions shown · non-contrast
Comparison: 06/29/2016

CLINICAL DATA: Sickle cell anemia.  Pain crisis.

EXAM:
CHEST  2 VIEW

[w chest pa]
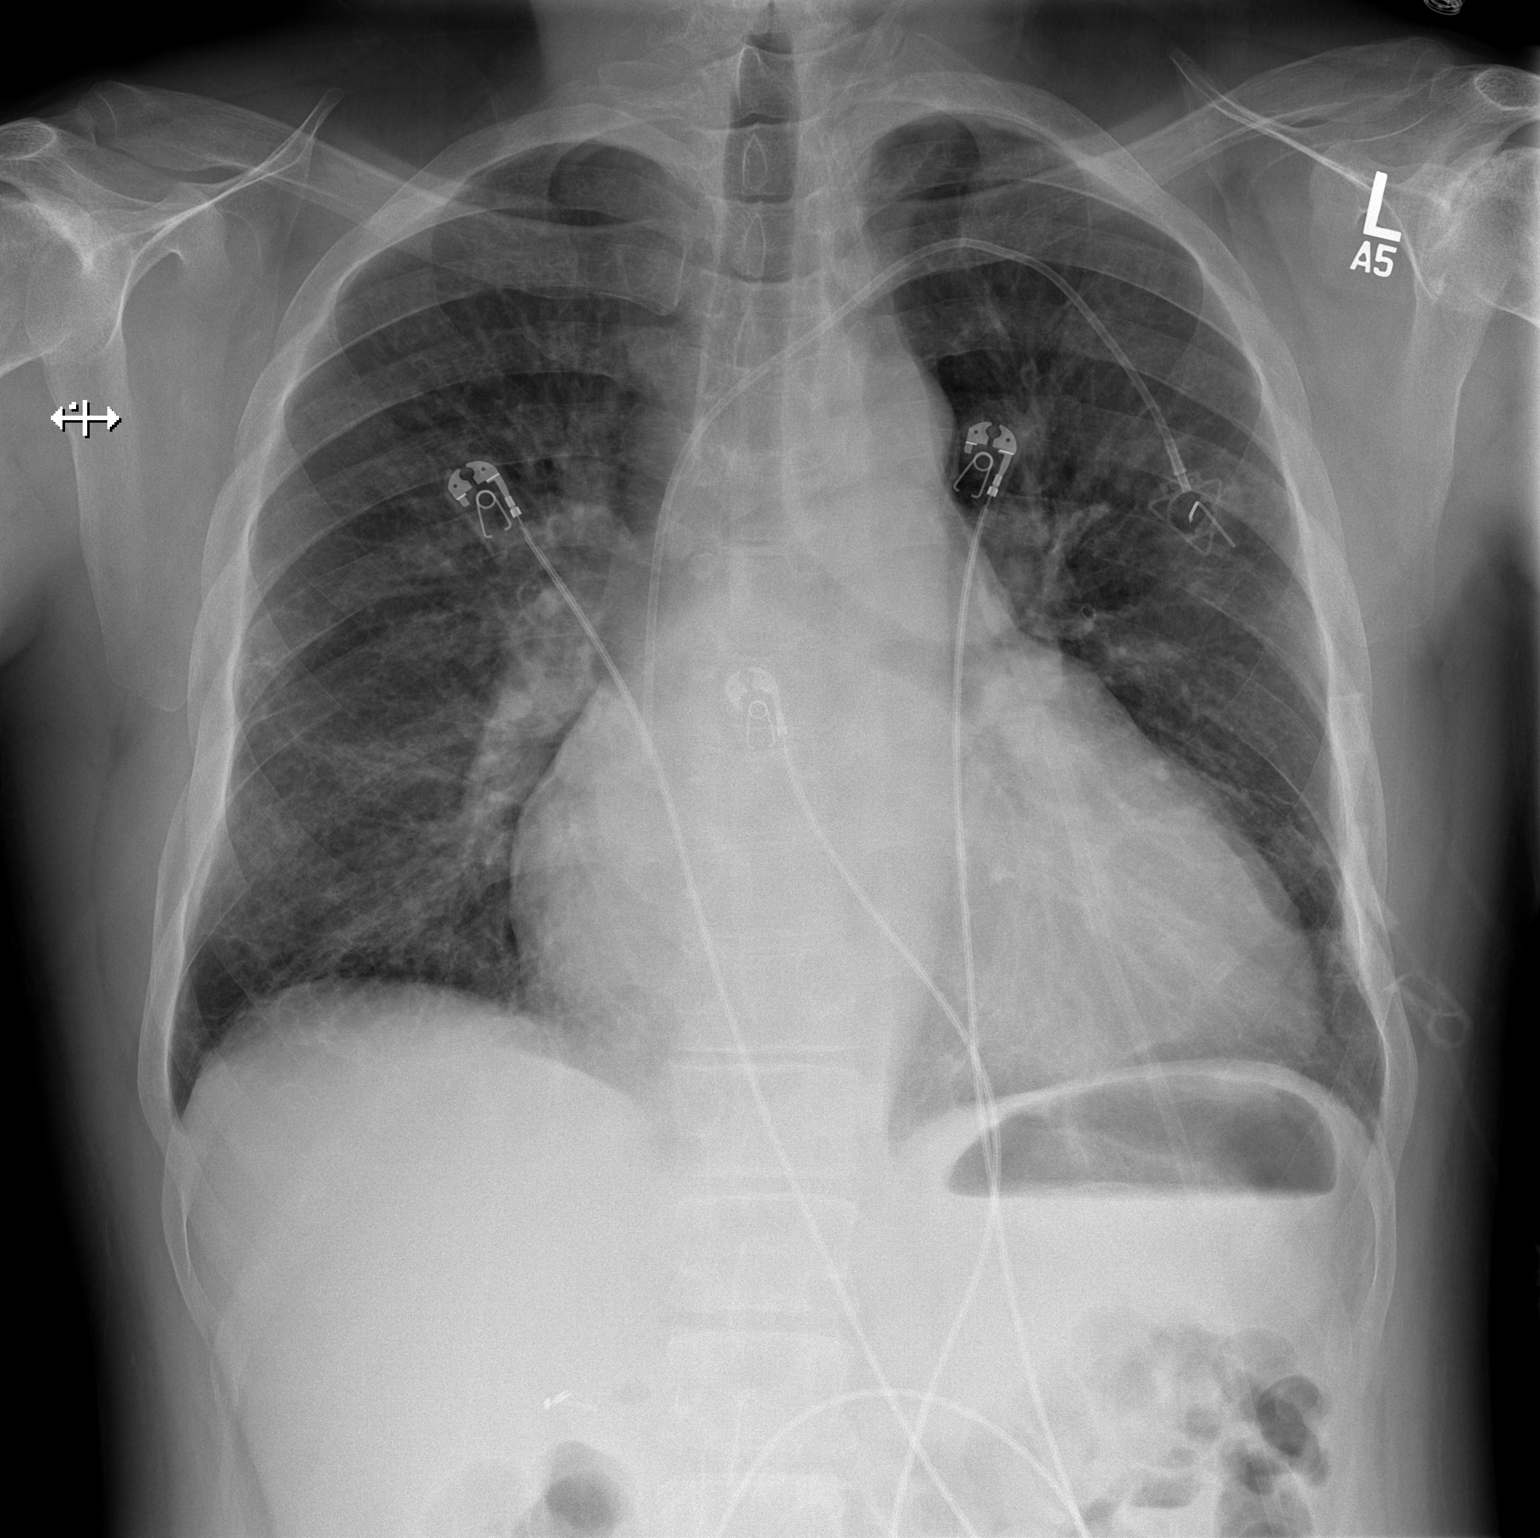

[w chest lat]
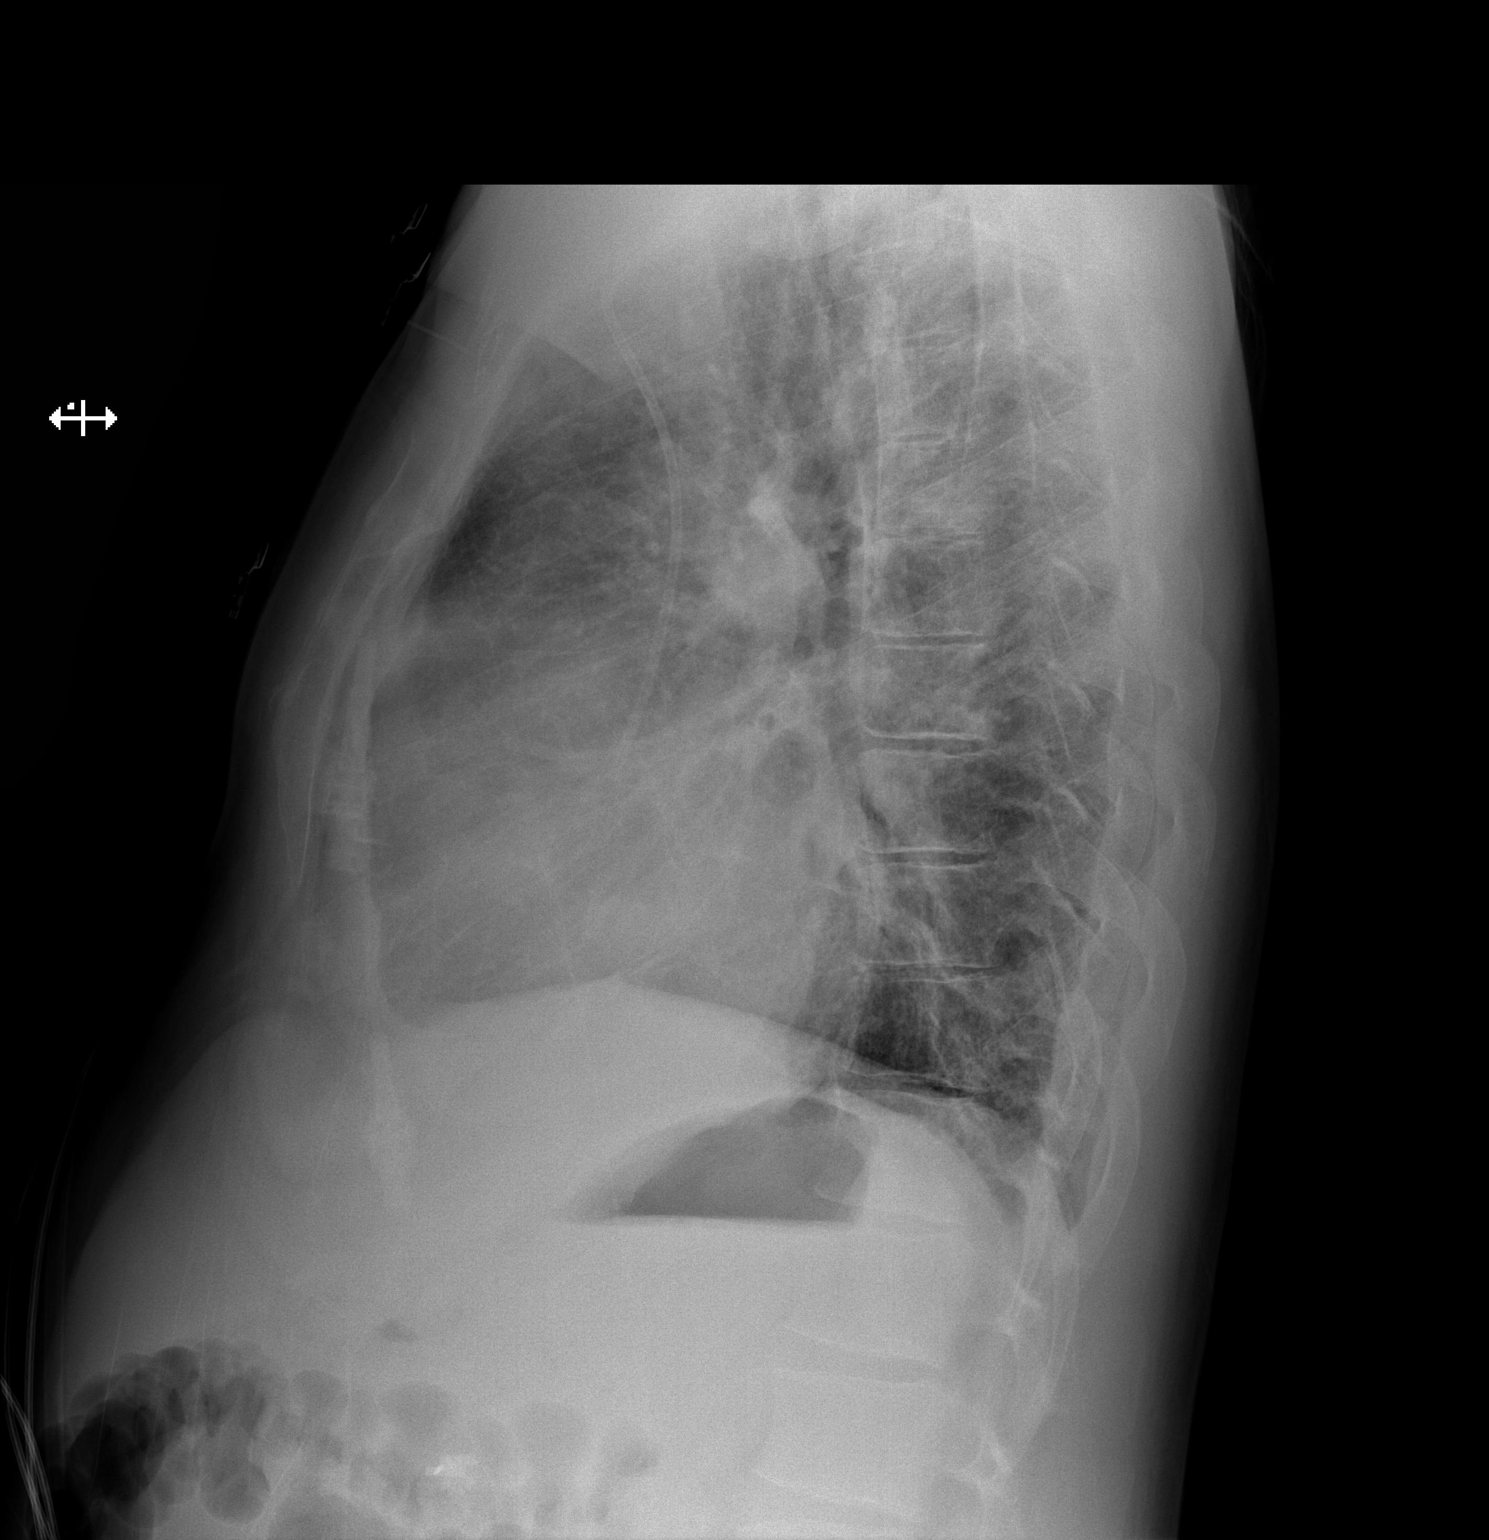

[2 of 2 positions shown; findings below may reference images not displayed]

FINDINGS: Left Port-A-Cath remains in place, unchanged. Cardiomegaly with
vascular congestion. No confluent opacities or effusions. No acute
bony abnormality.
IMPRESSION: Cardiomegaly with vascular congestion.

## 2019-06-07 IMAGING — DX DG CHEST 1V PORT
1 series · 1 of 1 positions shown · non-contrast
Comparison: Chest x-ray 07/03/2016.

CLINICAL DATA: 36-year-old male with history of sickle cell disease
presenting with bilateral chest pain, body pain and shortness of
breath today.

EXAM:
PORTABLE CHEST 1 VIEW

[chest ap]
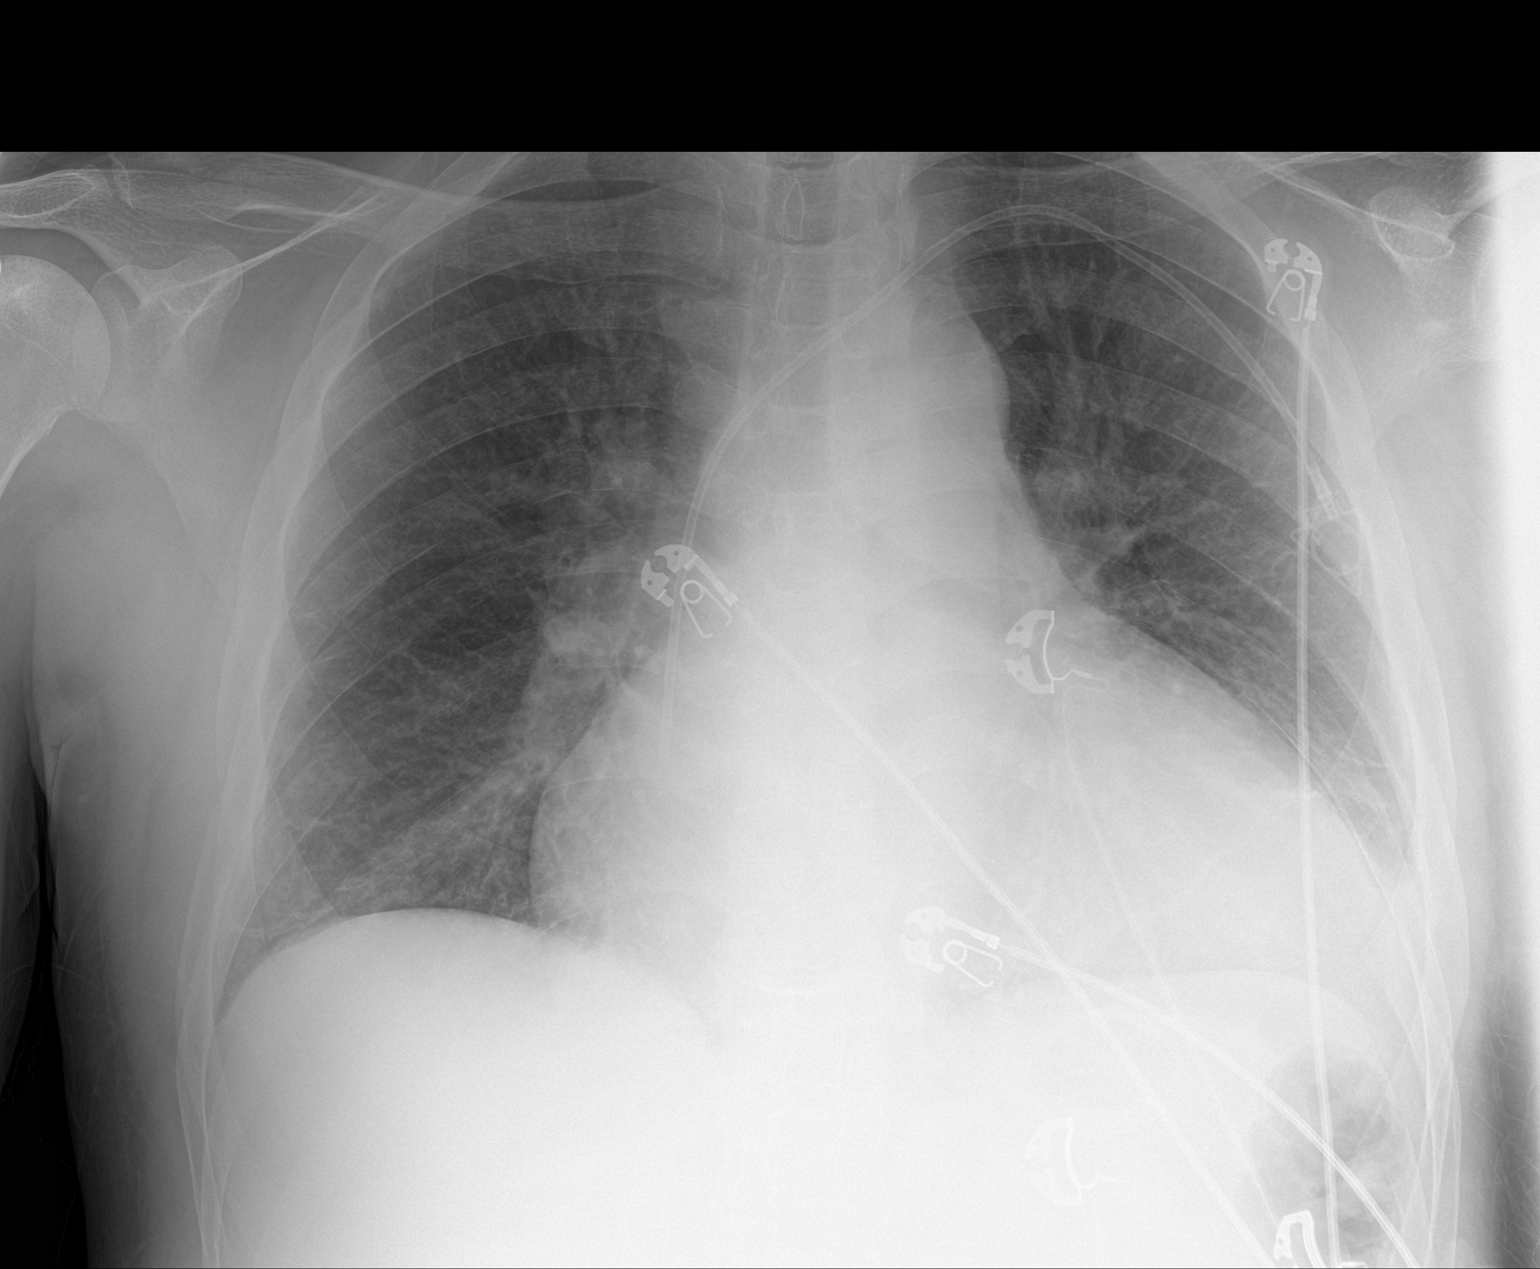

[1 of 1 positions shown; findings below may reference images not displayed]

FINDINGS: Mild blunting of the left costophrenic sulcus and obscuration of the
lateral left hemidiaphragm suggesting a small left pleural effusion.
No right pleural effusion. No definite consolidative airspace
disease. Scattered areas of linear scarring in the lung bases,
similar to prior studies. Cephalization of the pulmonary
vasculature, without frank pulmonary edema. No pneumothorax. Heart
size is mildly enlarged. Upper mediastinal contours are within
normal limits. Left-sided subclavian single-lumen power porta cath
with tip terminating at the superior cavoatrial junction.
IMPRESSION: 1. New small left pleural effusion.
2. Chronic scarring in the lung bases bilaterally.
3. Cardiomegaly with pulmonary venous congestion, but no frank
pulmonary edema.

## 2019-06-11 IMAGING — CR DG CHEST 2V
2 series · 2 of 2 positions shown · non-contrast
Comparison: Portable chest x-ray July 14, 2016

CLINICAL DATA: Bilateral chest and back pain, somnolence. Former
smoker. History of sickle cell anemia, pulmonary embolus.

EXAM:
CHEST  2 VIEW

[w chest lat]
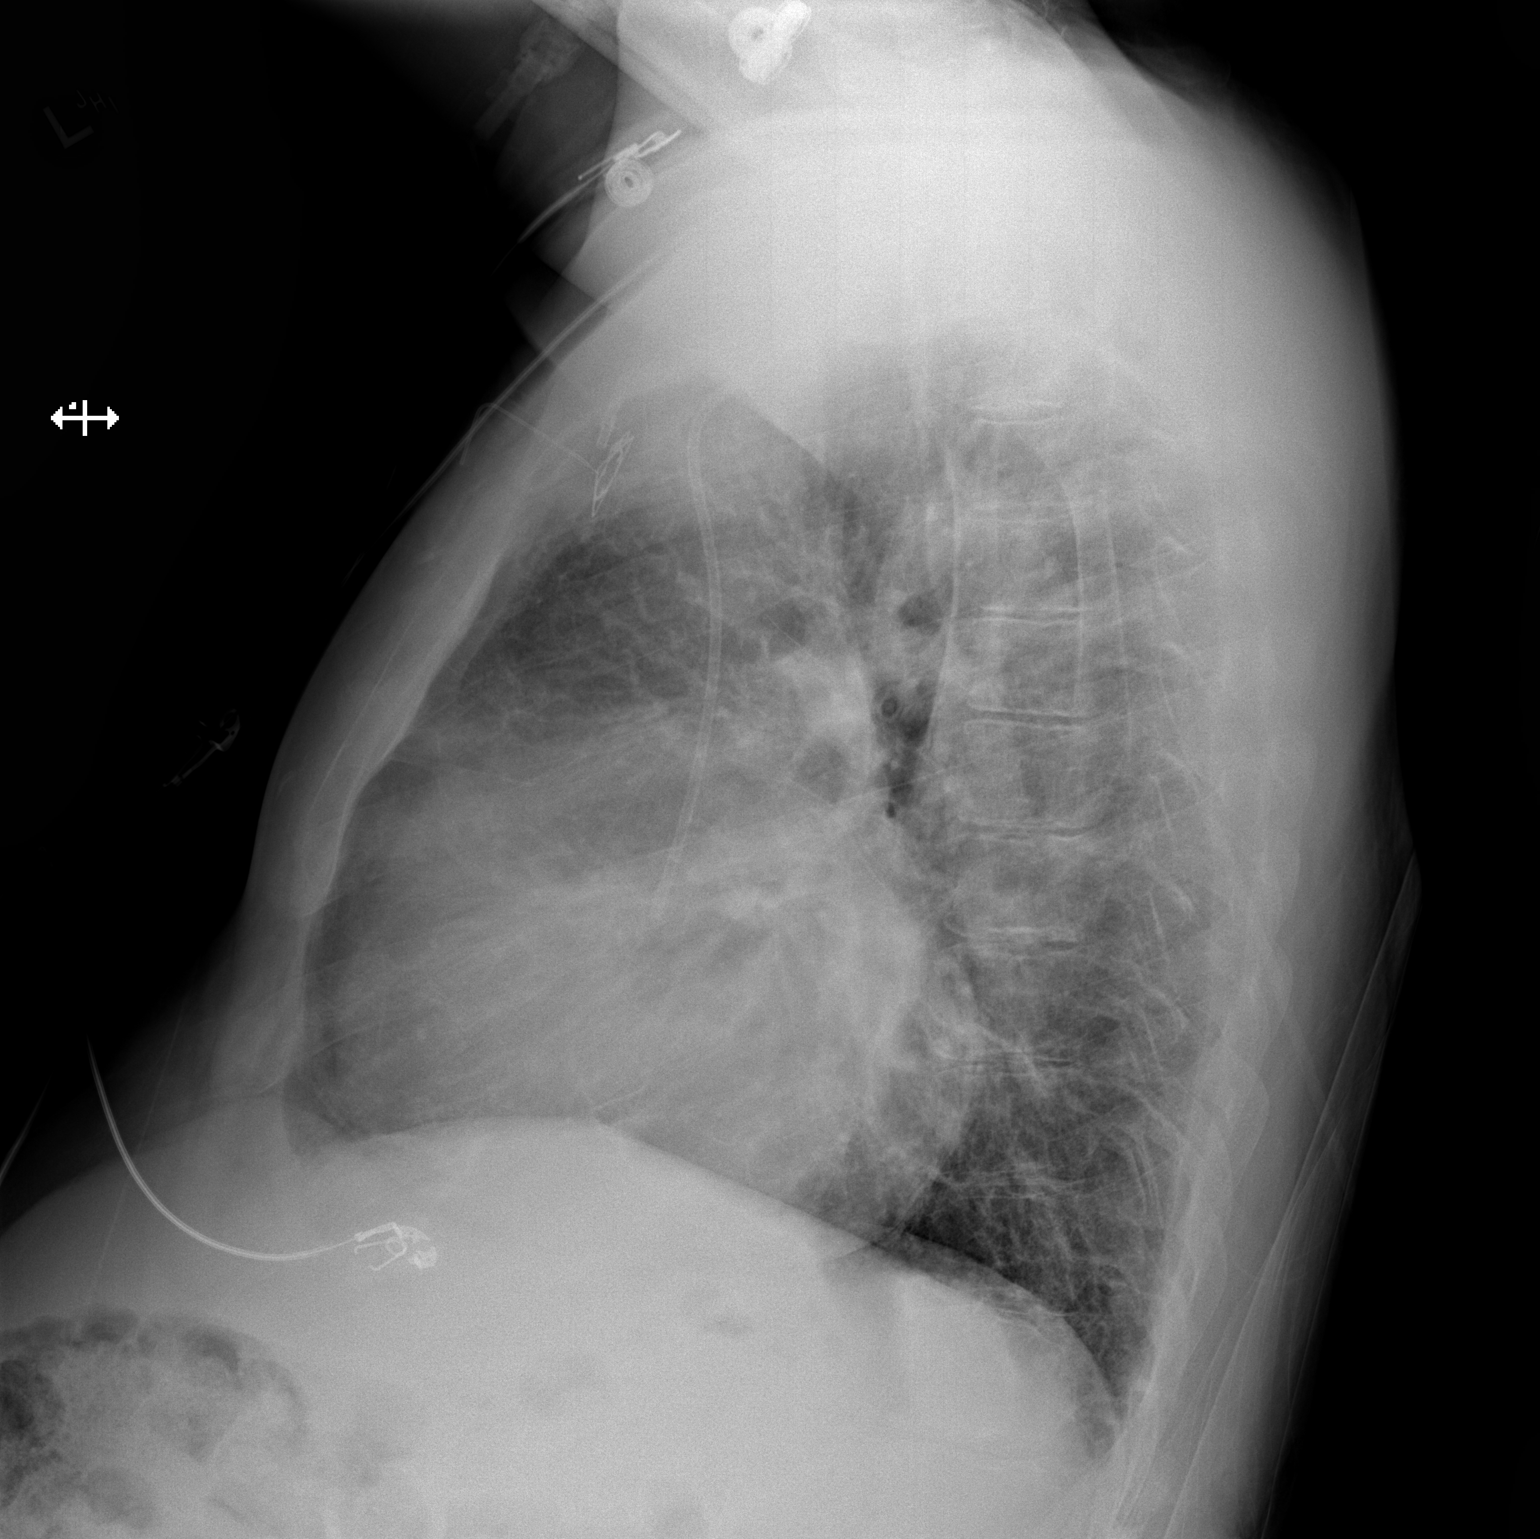

[x chest ap]
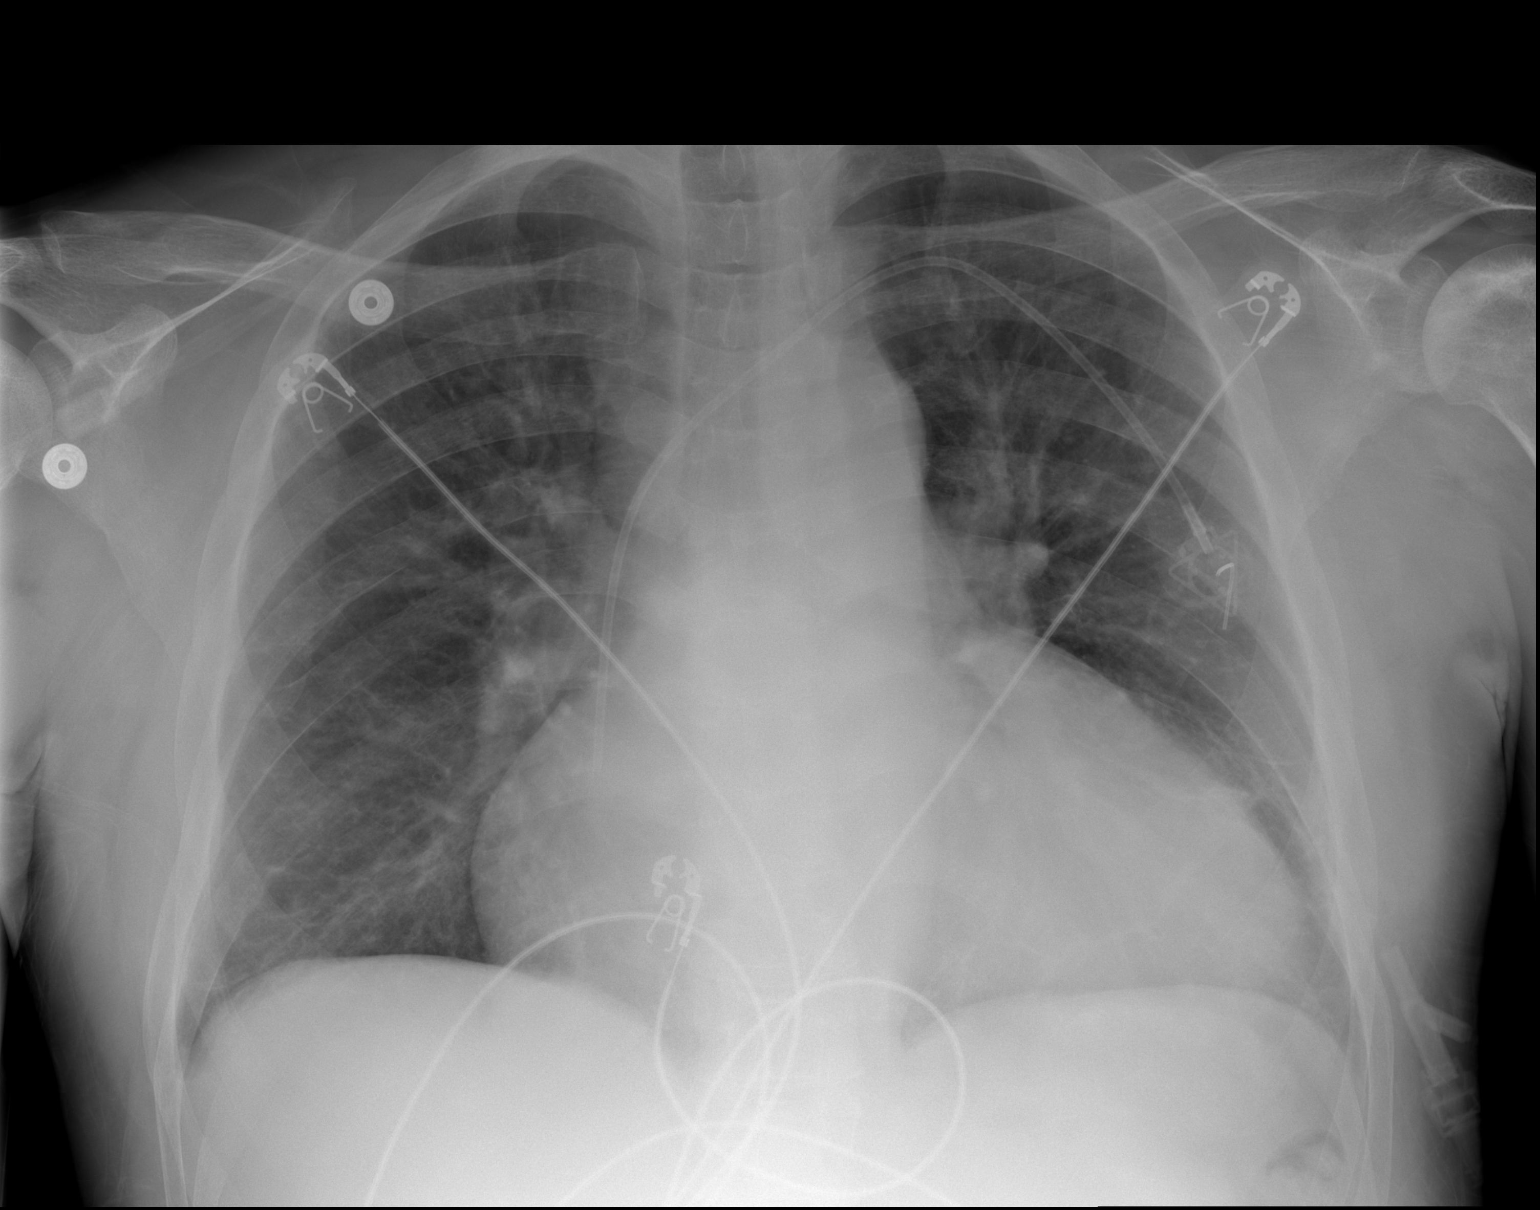

[2 of 2 positions shown; findings below may reference images not displayed]

FINDINGS: The lungs are well-expanded. There is no focal infiltrate. No
significant pleural effusion is visible. The cardiac silhouette
remains enlarged. The pulmonary vascularity remains engorged. The
pulmonary interstitial markings are mildly increased over baseline.
The power port catheter tip projects over the cavoatrial junction.
The mediastinum is normal in width. The bony thorax exhibits no
acute abnormality.
IMPRESSION: COPD and smoking related changes, stable. Mild pulmonary
interstitial prominence bilaterally suggests low-grade CHF. Stable
cardiomegaly.

## 2019-06-13 IMAGING — CR DG CHEST 2V
2 series · 2 of 2 positions shown · non-contrast
Comparison: Chest CTA 07/18/2016 and earlier.

CLINICAL DATA: 36-year-old male with sickle cell disease. Bilateral
rib and leg pain.

EXAM:
CHEST  2 VIEW

[w chest pa]
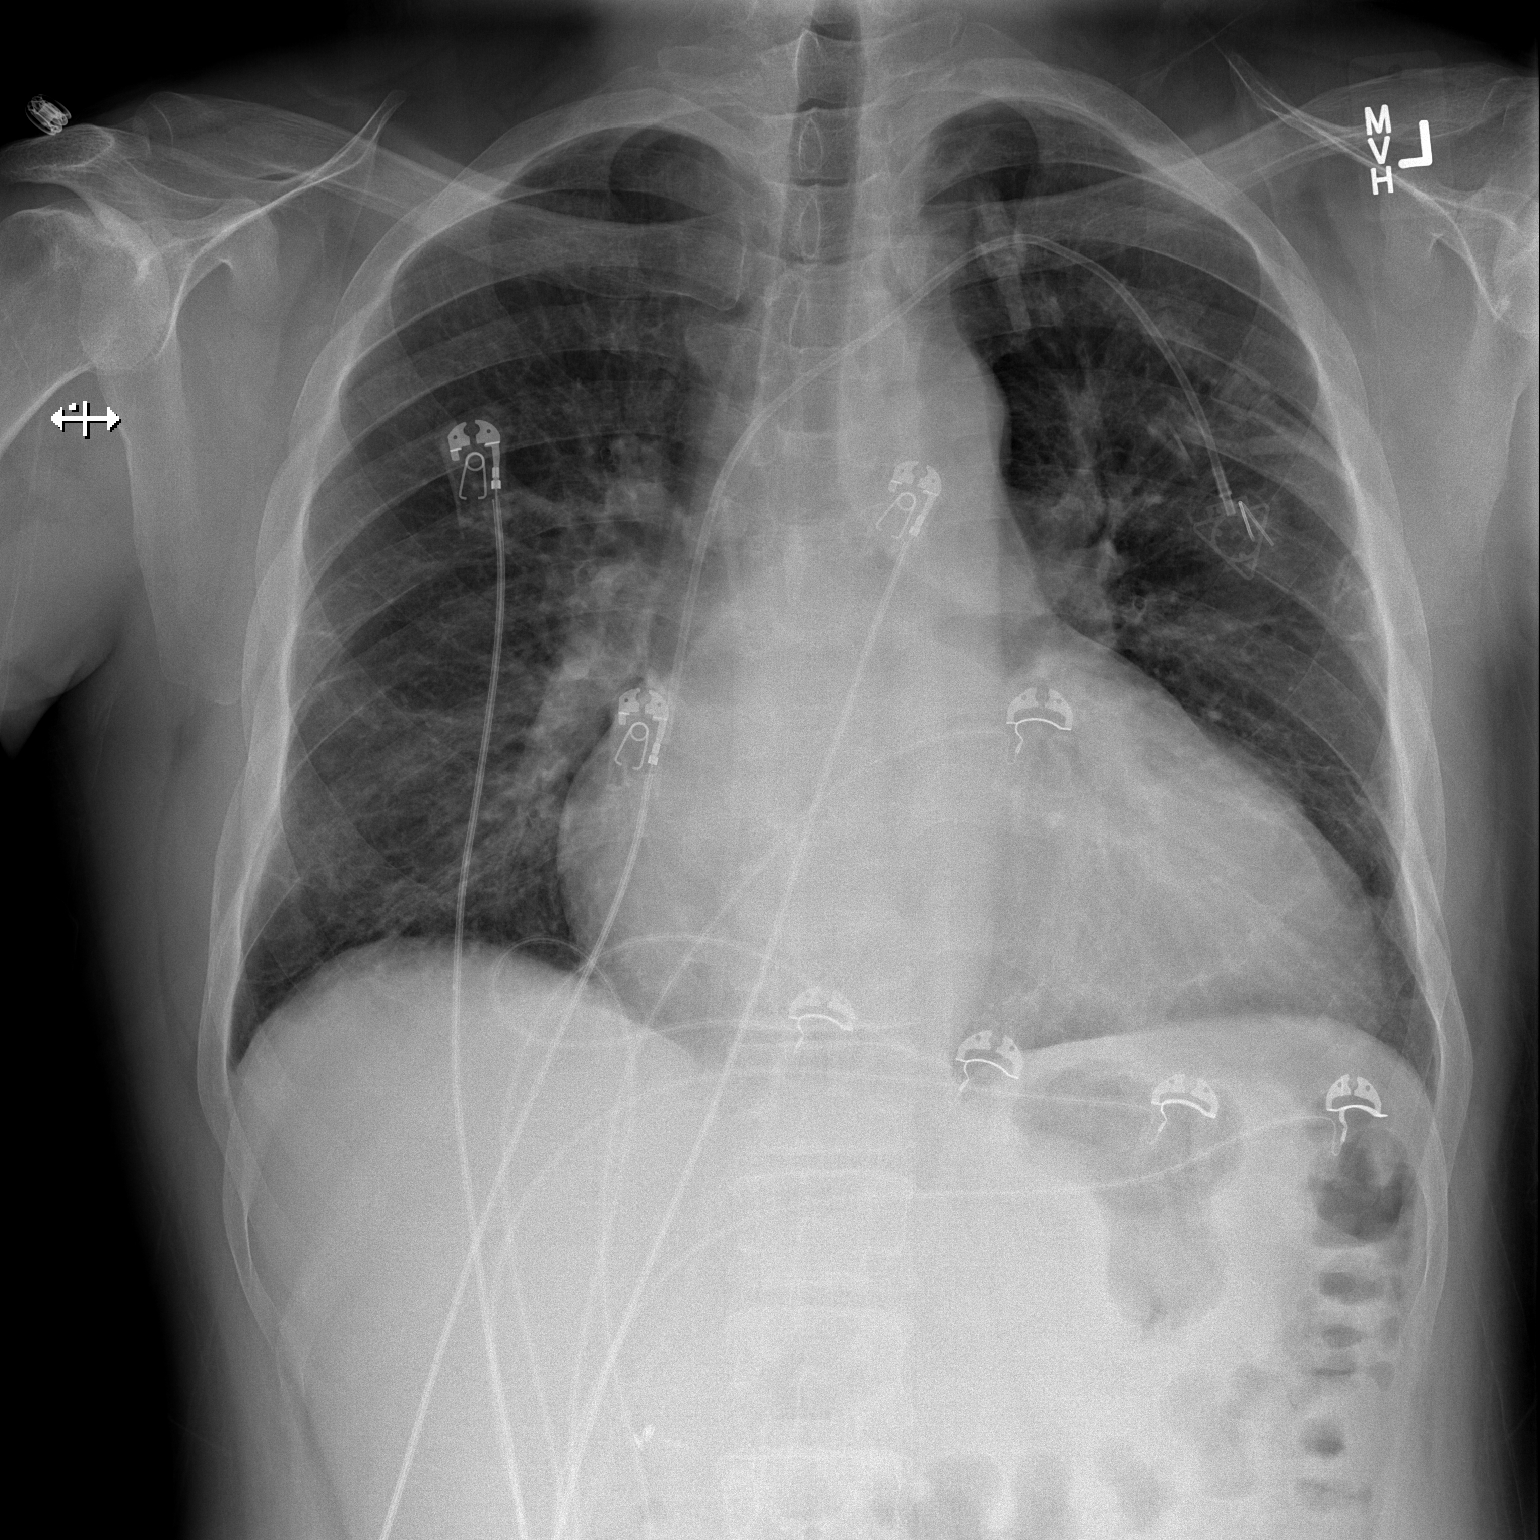

[w chest lat]
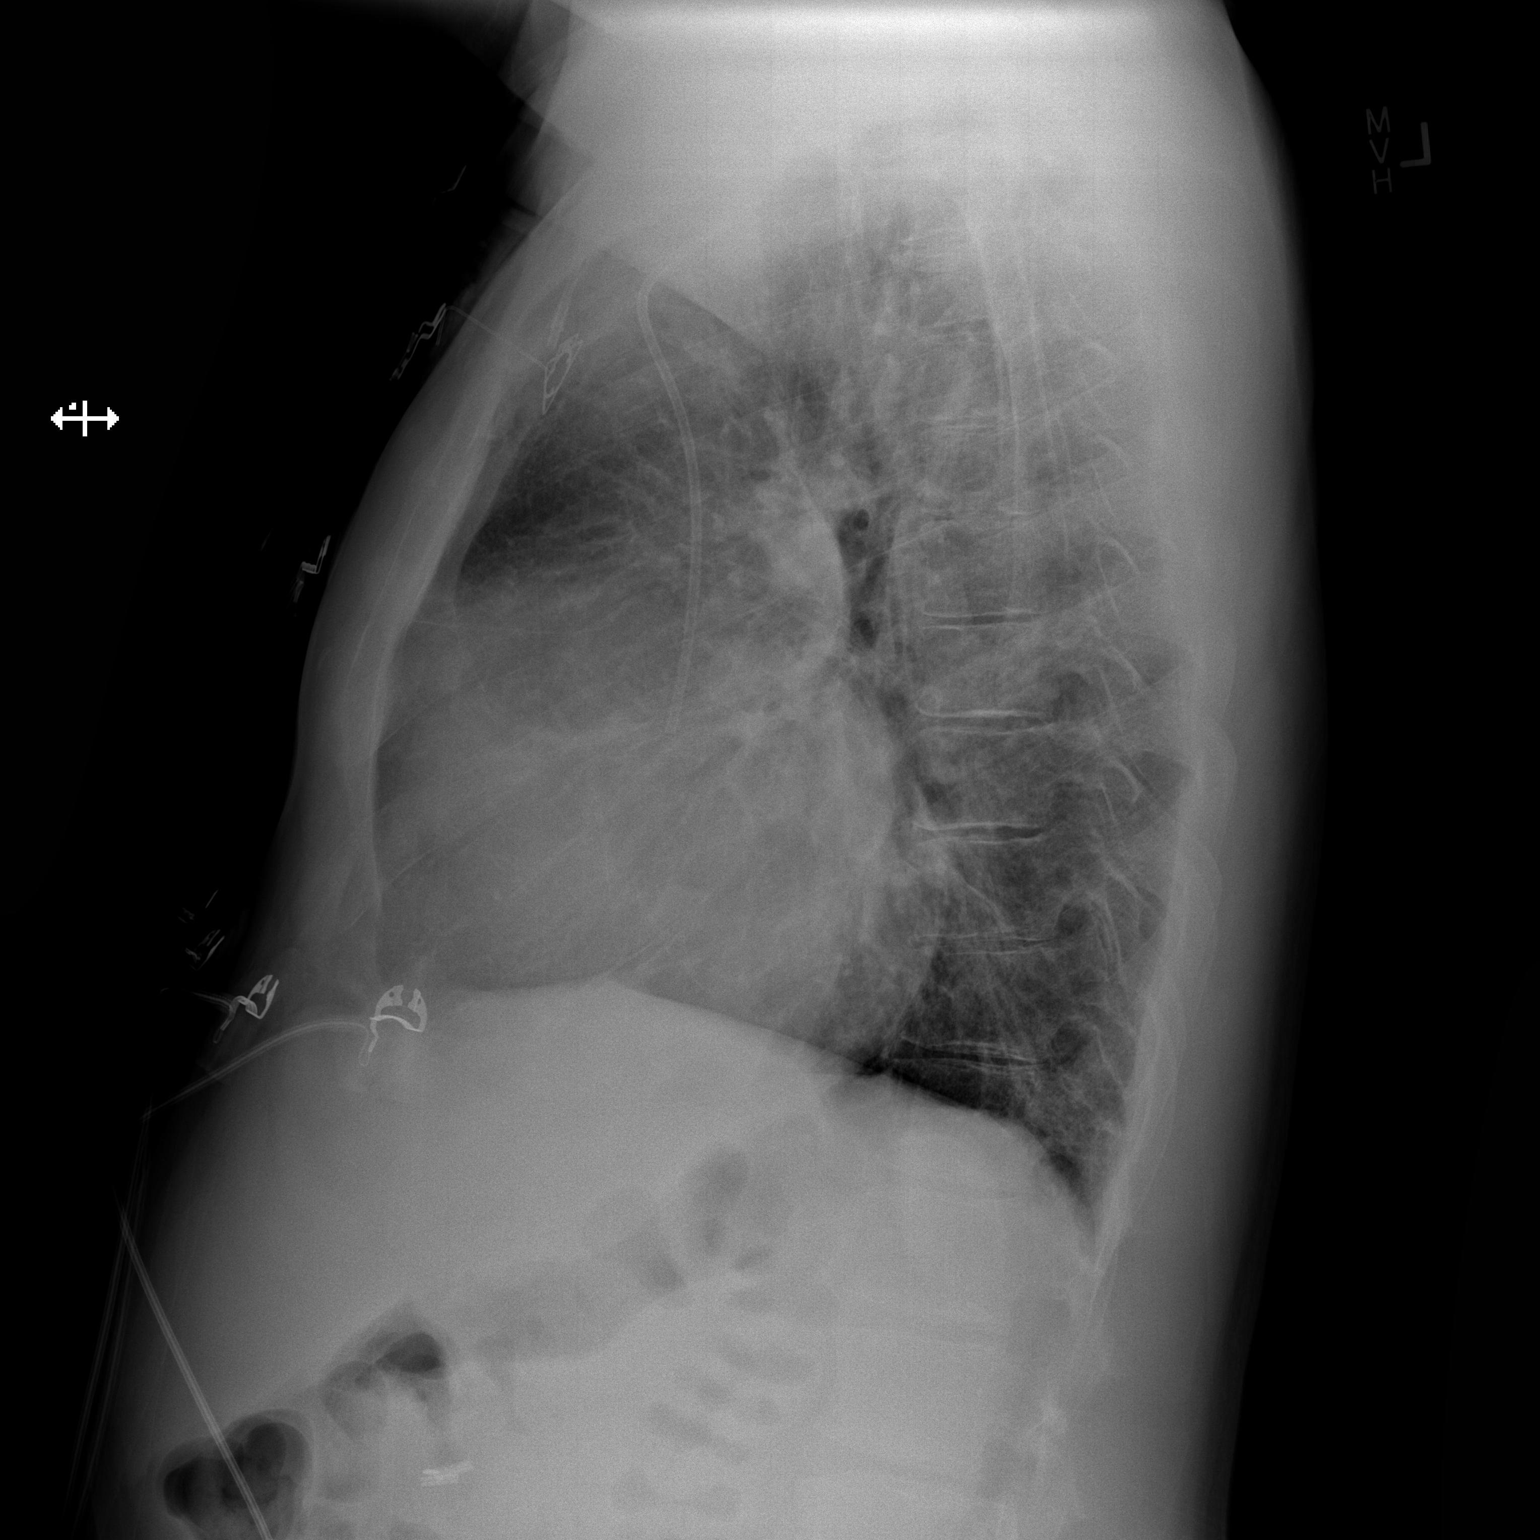

[2 of 2 positions shown; findings below may reference images not displayed]

FINDINGS: Stable cardiomegaly and mediastinal contours. Stable left subclavian
Port-A-Cath, accessed.

Stable to mildly improved lung volumes. No pneumothorax. Stable
pulmonary vascularity without acute edema. No pleural effusion or
confluent pulmonary opacity. Stable cholecystectomy clips. Negative
visible bowel gas pattern.

Stable visualized osseous structures, osteopenia and also H-type
vertebral body changes better demonstrated on the recent CTA.
IMPRESSION: Stable cardiomegaly. No acute cardiopulmonary abnormality.

## 2019-06-19 IMAGING — CR DG CHEST 1V PORT
1 series · 1 of 1 positions shown · non-contrast
Comparison: 07/20/2016

CLINICAL DATA: Awakened with chest pain. History of sickle cell
disease.

EXAM:
PORTABLE CHEST 1 VIEW

[AP]
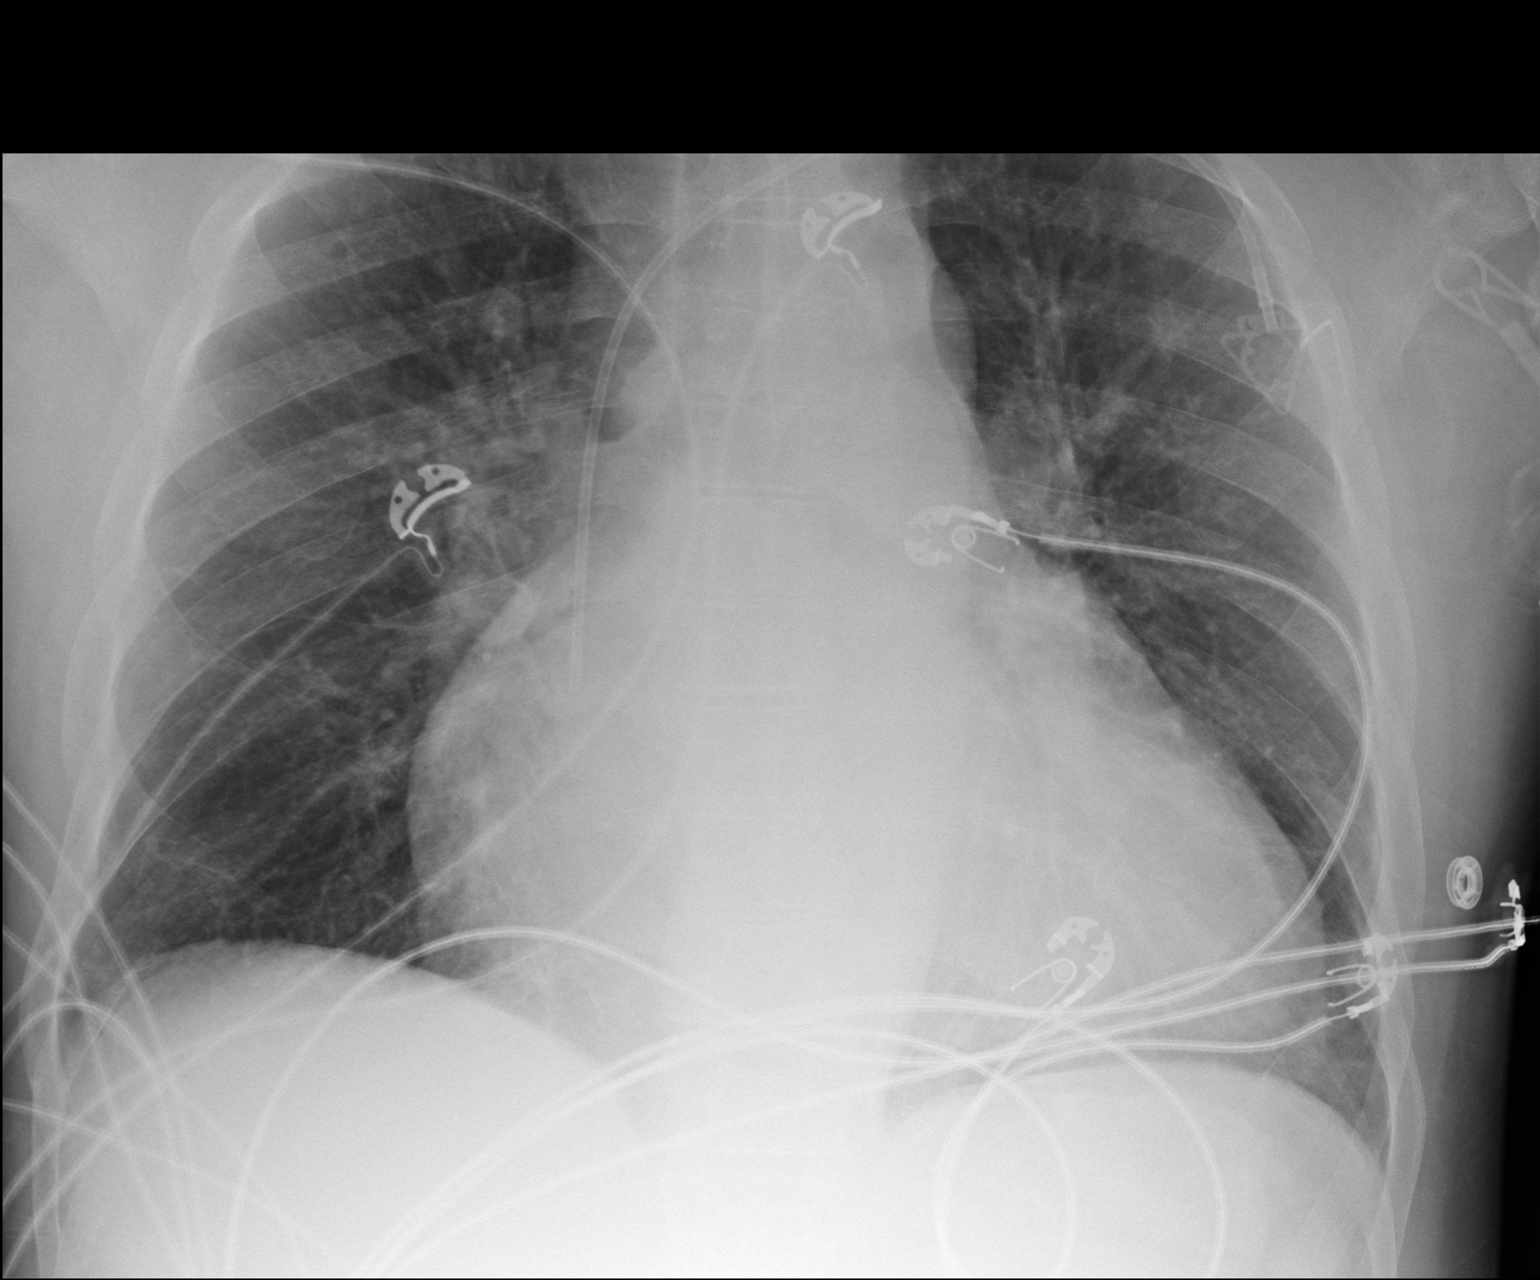

[1 of 1 positions shown; findings below may reference images not displayed]

FINDINGS: Unchanged prominent cardiac silhouette. The lungs are clear. No
pleural effusions. No pneumothorax. Hilar and mediastinal contours
are unremarkable and unchanged.

Left-sided port appears intact, extending into the low SVC.
IMPRESSION: Enlarged cardiac silhouette without significant interval change. No
consolidation or effusion.

## 2019-06-19 IMAGING — CT CT ANGIO CHEST
2 of 6 series · 18 of 36 positions shown · IV contrast (isovue)
Comparison: Concurrent CT angiogram of the abdomen and pelvis.
07/18/2016 CT angiogram of the chest.

CLINICAL DATA: 36 y/o M; rib pain, abdominal pain, jaundice,
history of sickle cell, and history of asplenia.

EXAM:
CT ANGIOGRAPHY CHEST WITH CONTRAST
TECHNIQUE: Multidetector CT imaging of the chest was performed using the
standard protocol during bolus administration of intravenous
contrast. Multiplanar CT image reconstructions and MIPs were
obtained to evaluate the vascular anatomy.
CONTRAST:  100 cc Isovue 370

[Series 7: pe thins · axial · 0.82mm/px · z∈[+1283,+1538]mm · 17 of 287 slices shown]
[im 16/287  lung]
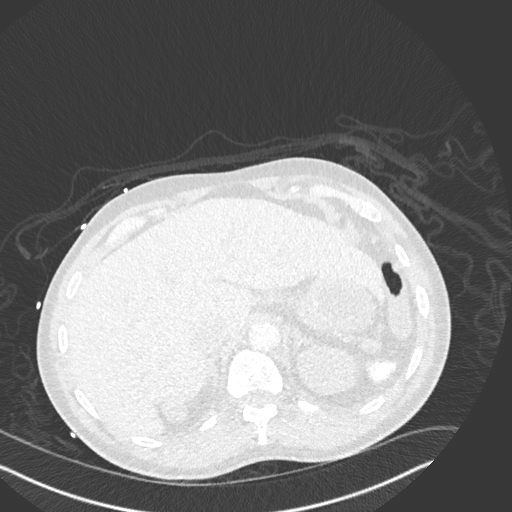
[im 32/287  mediastinal]
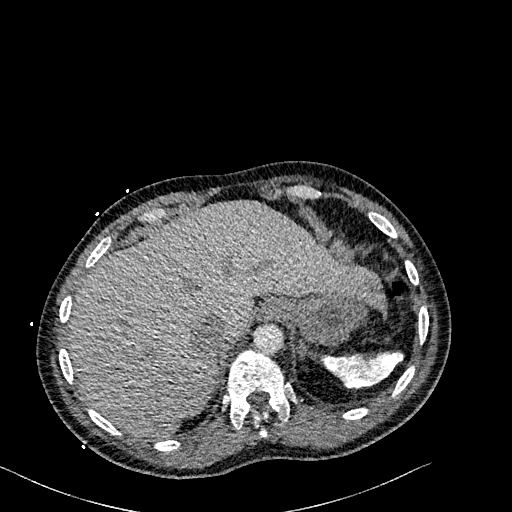
[im 48/287  lung]
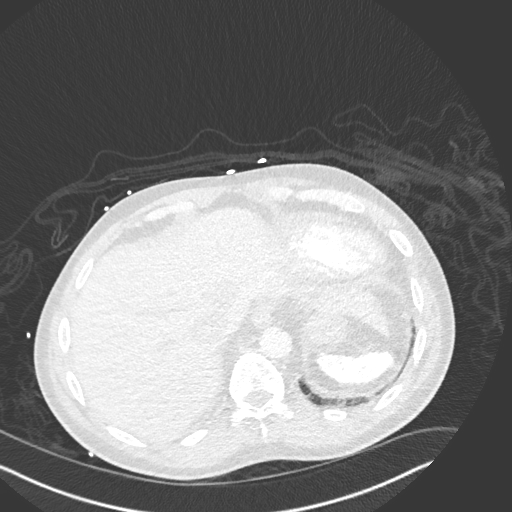
[im 64/287  mediastinal]
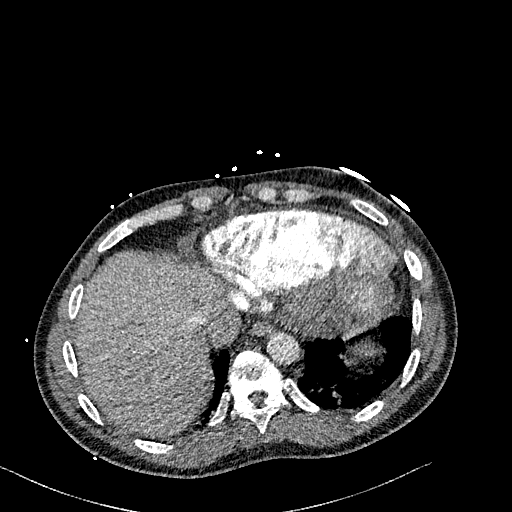
[im 80/287  lung]
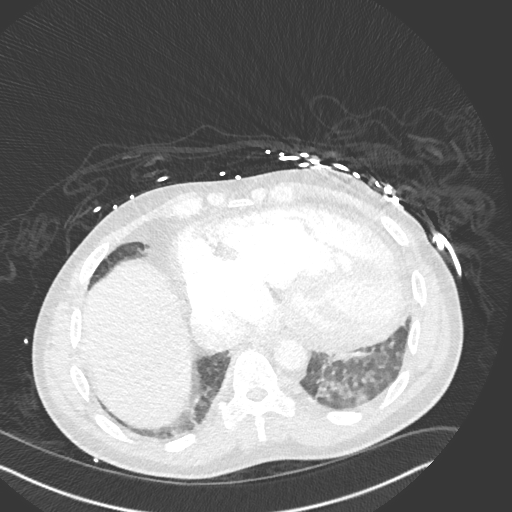
[im 96/287  mediastinal]
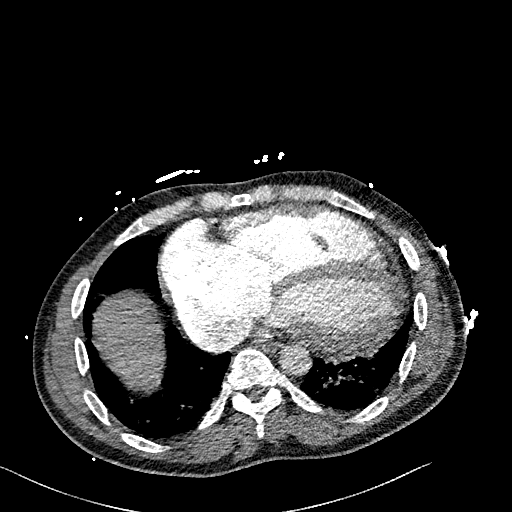
[im 112/287  lung]
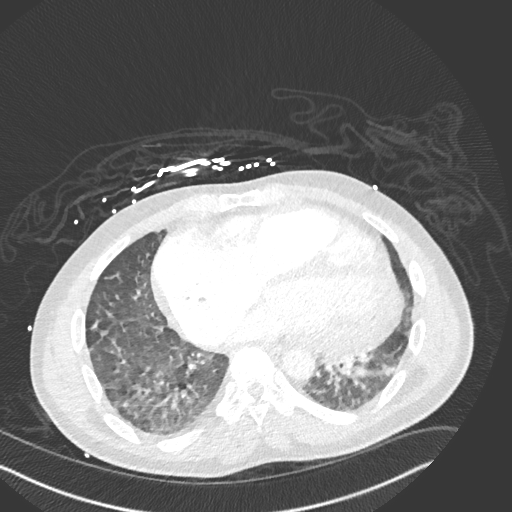
[im 128/287  mediastinal]
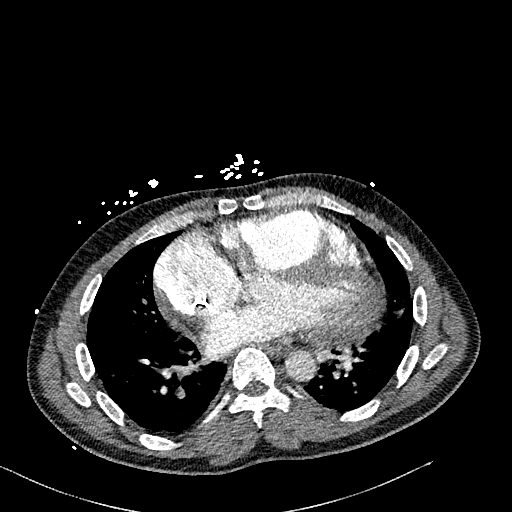
[im 144/287  lung]
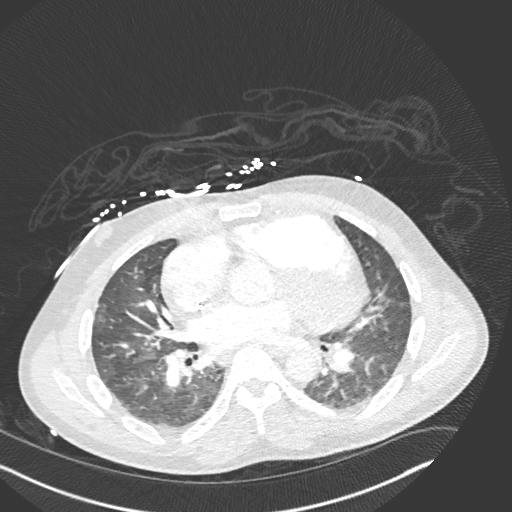
[im 159/287  mediastinal]
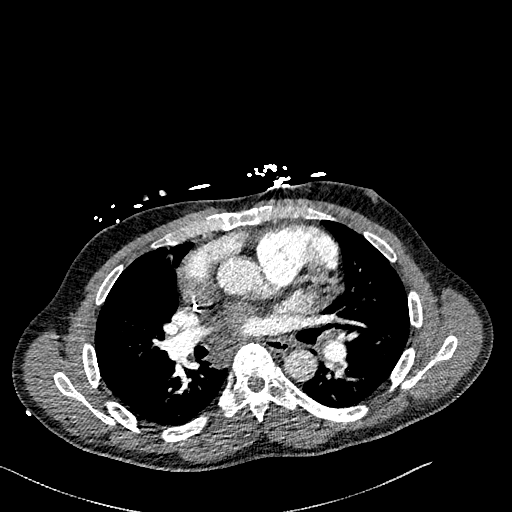
[im 175/287  lung]
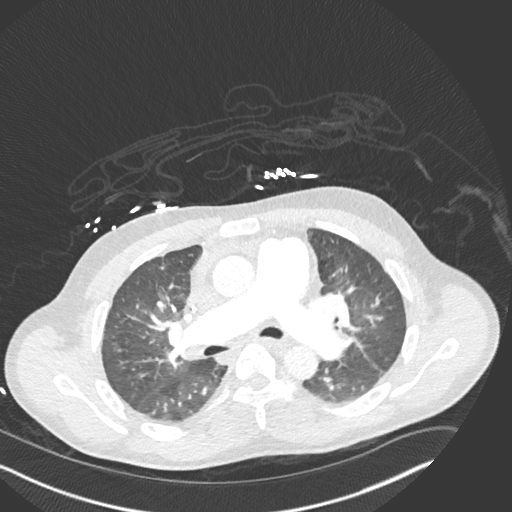
[im 191/287  mediastinal]
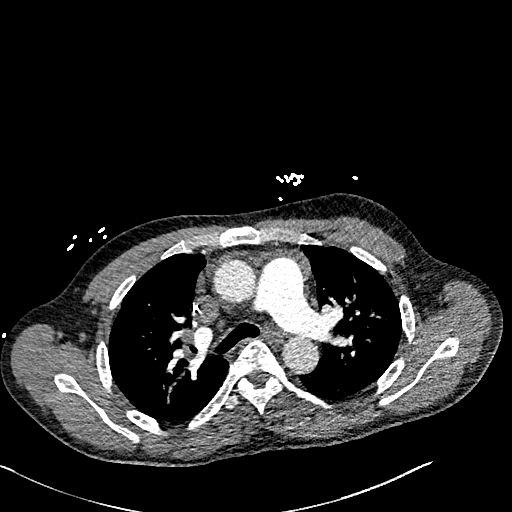
[im 207/287  lung]
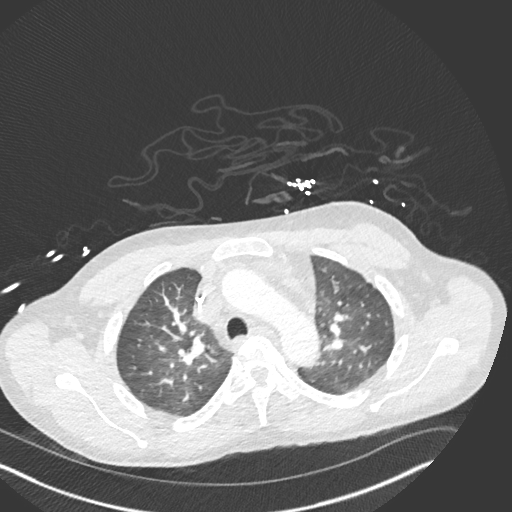
[im 223/287  mediastinal]
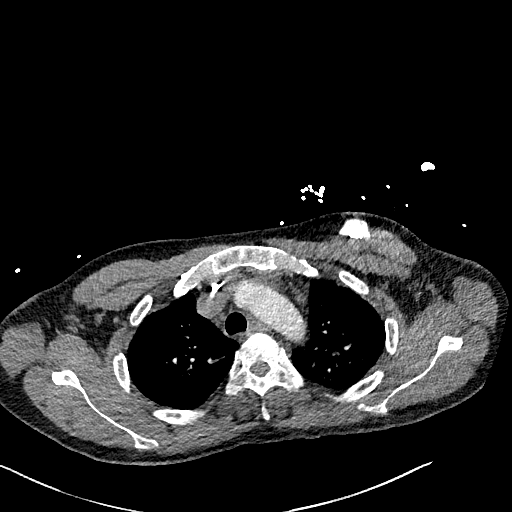
[im 239/287  lung]
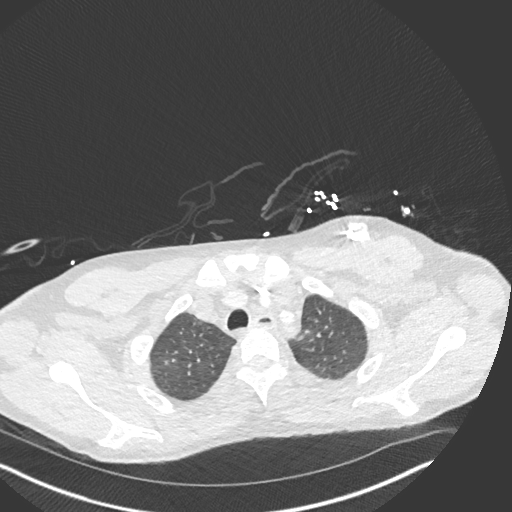
[im 255/287  mediastinal]
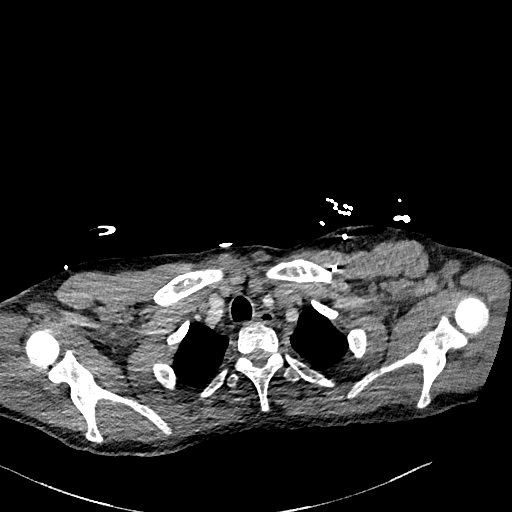
[im 271/287  lung]
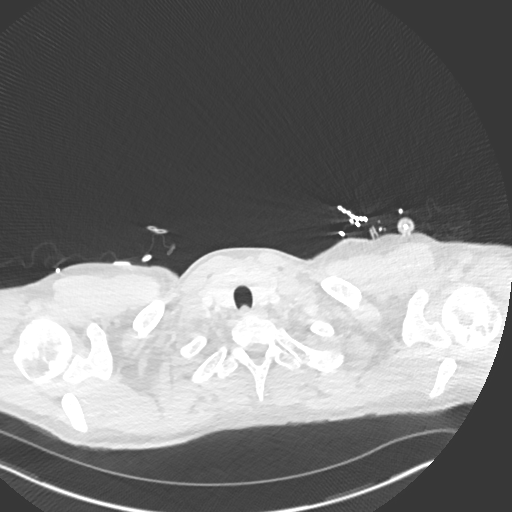

[Series 8: pe 2mm cor · coronal · 0.59mm/px · 1 of 135 slices shown]
[im 68/135  mediastinal]
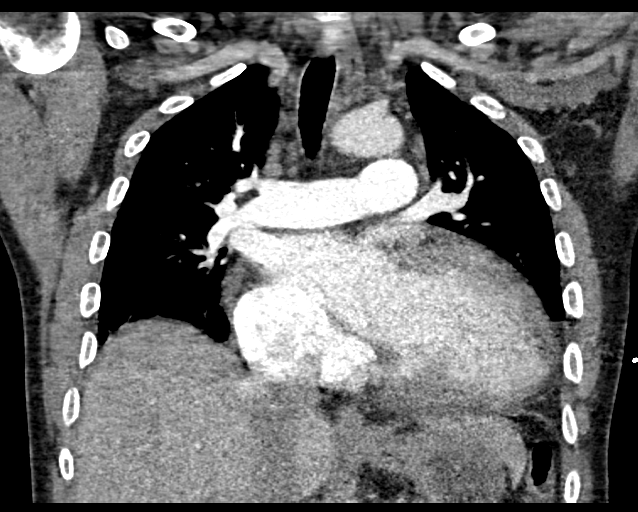

[18 of 36 positions shown; findings below may reference images not displayed]

FINDINGS: Cardiovascular: Enlarged main pulmonary artery measuring 4.2 cm.
Normal caliber thoracic aorta.

Stable severe cardiomegaly. Left port catheter tip projects within
the right atrium. No pericardial effusion.

Multiple segmental acute pulmonary emboli within the lungs
bilaterally best appreciated in the right lower lobe (series 7,
image 151).

Mediastinum/Nodes: No enlarged mediastinal, hilar, or axillary lymph
nodes. Thyroid gland, trachea, and esophagus demonstrate no
significant findings.

Lungs/Pleura: Stable 6 mm nodule in the right middle lobe. Septal
thickening of the lung bases and right perihilar ground-glass
opacities probably represents mild pulmonary edema. No pleural
effusion.

Upper Abdomen: Calcified atrophic spleen.

Musculoskeletal: H shaped vertebral body compatible with bony
stigmata of sickle cell disease.

Review of the MIP images confirms the above findings.
IMPRESSION: 1. Multiple segmental acute pulmonary emboli.
2. Stable severe cardiomegaly.
3. Enlarged main pulmonary artery indicates pulmonary artery
hypertension.
4. Mild pulmonary edema.
5. Calcified atrophic spleen.
These results were called by telephone at the time of interpretation
on 07/26/2016 at [DATE] to Dr. CARBO SARAZIN , who verbally
acknowledged these results.

By: Rakhee Laynez M.D.

## 2019-06-19 IMAGING — CT CT CTA ABD/PEL W/CM AND/OR W/O CM
2 of 9 series · 10 of 46 positions shown, 16 images · IV contrast (isovue)
Comparison: 12/15/2015

CLINICAL DATA: Chest wall pain and abdominal pain. Sickle cell
disease.

EXAM:
CTA ABDOMEN AND PELVIS wITHOUT AND WITH CONTRAST
TECHNIQUE: Multidetector CT imaging of the abdomen and pelvis was performed
using the standard protocol during bolus administration of
intravenous contrast. Multiplanar reconstructed images and MIPs were
obtained and reviewed to evaluate the vascular anatomy.
CONTRAST:  150 mL Isovue 370 intravenous

[Series 7: renal cta cor · coronal · 0.74mm/px · 2 of 145 slices shown, 3 images]
[im 49/145  soft-tissue]
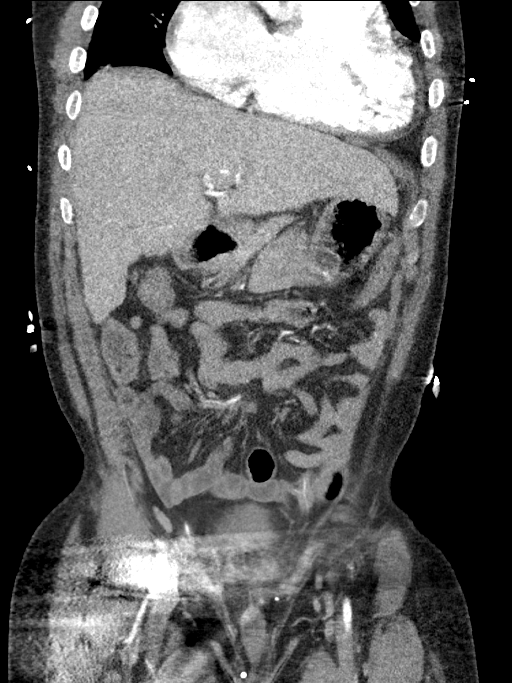
[im 49/145  bone]
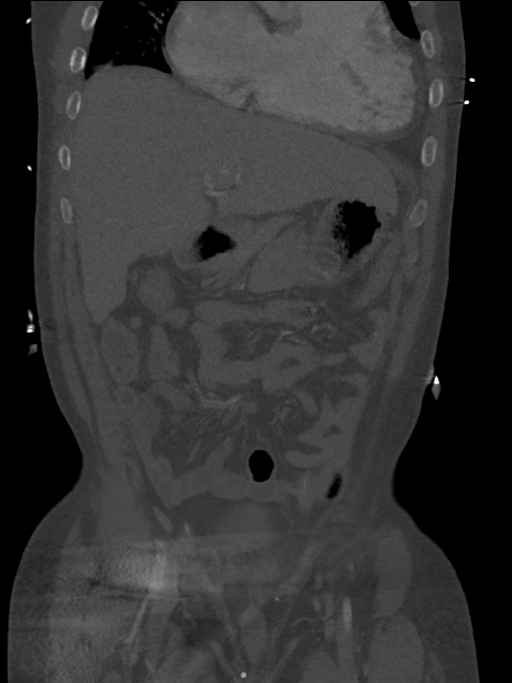
[im 97/145  soft-tissue]
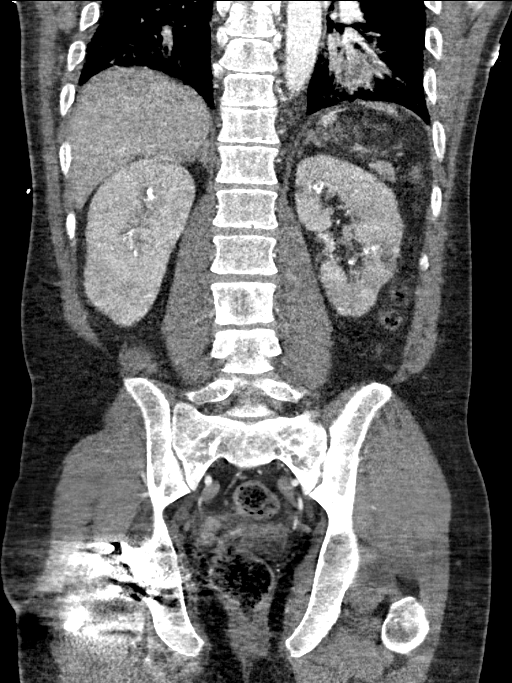

[Series 11: venous 5.0 · axial · portal-venous · 0.72mm/px · z∈[+970,+1355]mm · 8 of 101 slices shown, 13 images]
[im 12/101  soft-tissue]
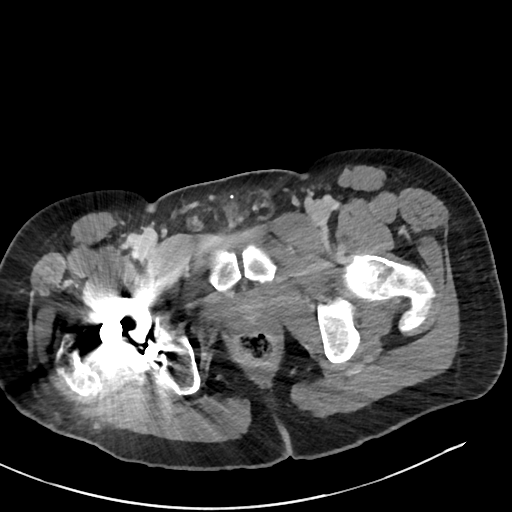
[im 12/101  bone]
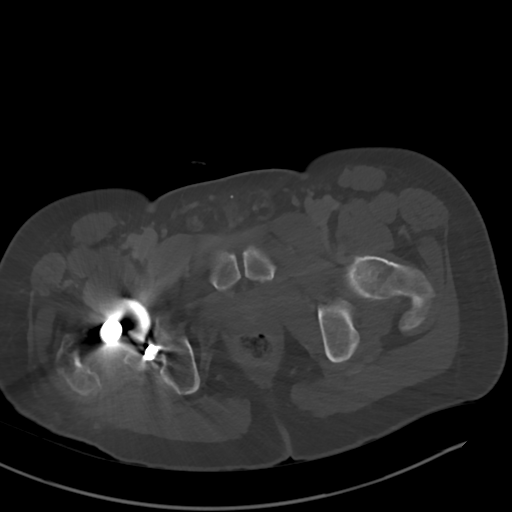
[im 23/101  soft-tissue]
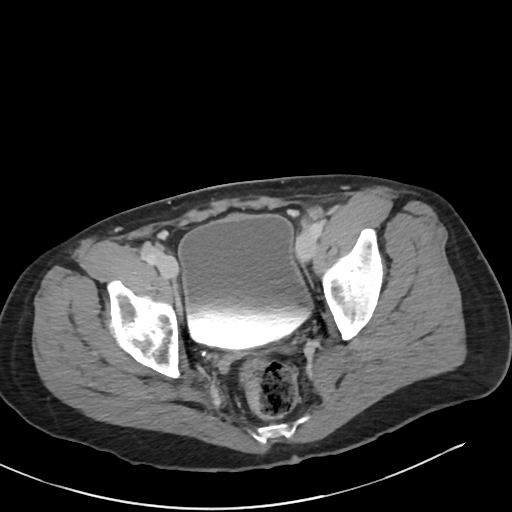
[im 34/101  soft-tissue]
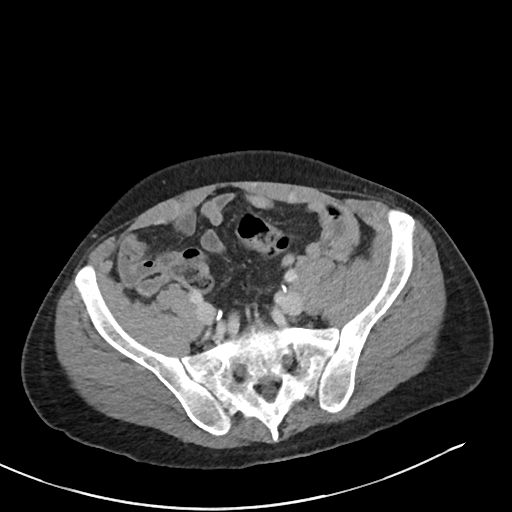
[im 45/101  soft-tissue]
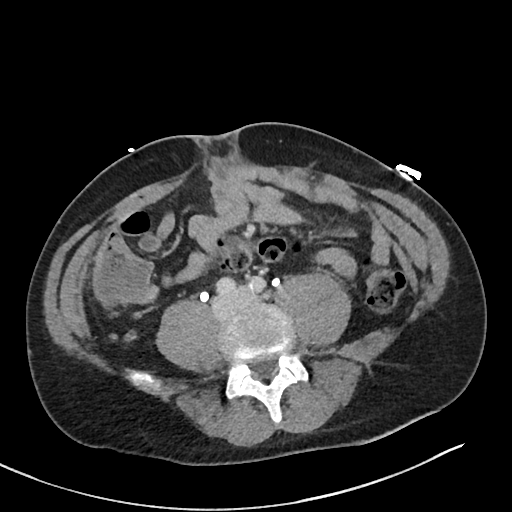
[im 56/101  soft-tissue]
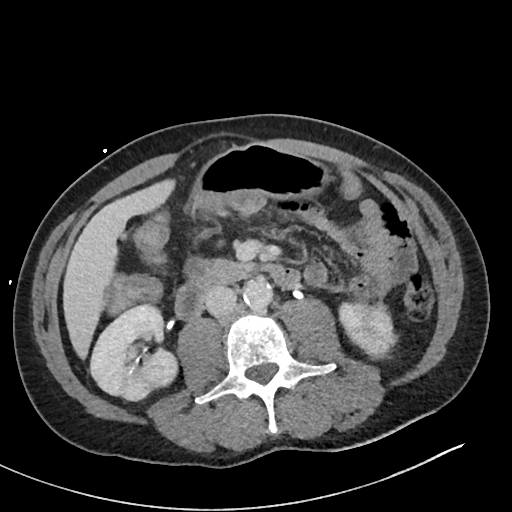
[im 56/101  lung]
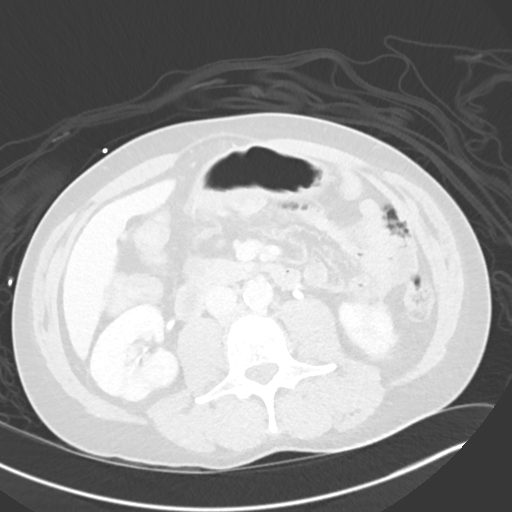
[im 67/101  soft-tissue]
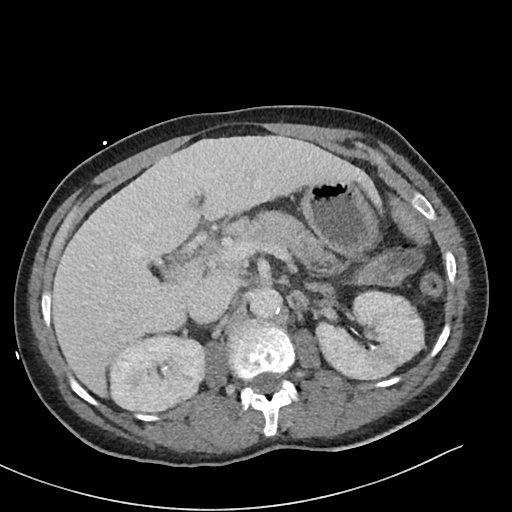
[im 67/101  lung]
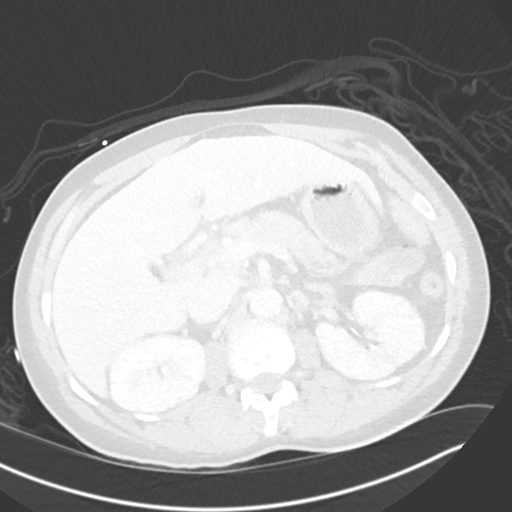
[im 78/101  soft-tissue]
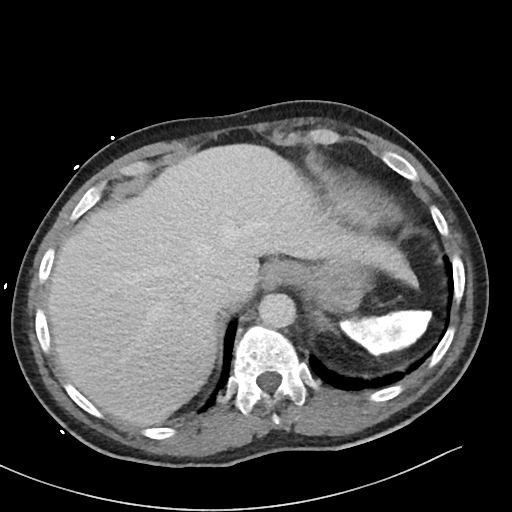
[im 78/101  lung]
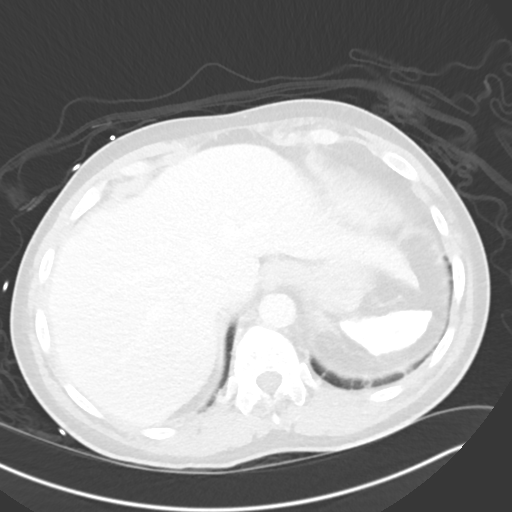
[im 89/101  soft-tissue]
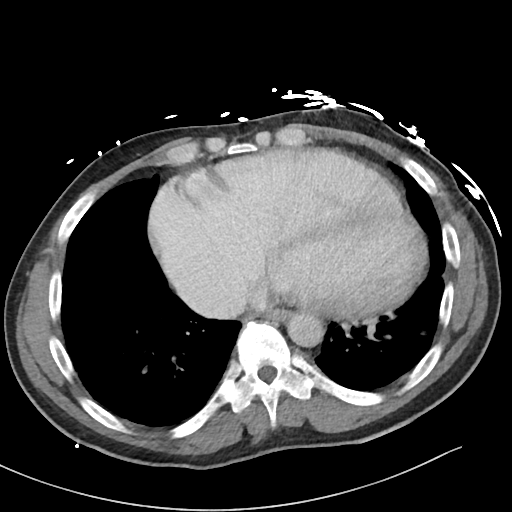
[im 89/101  lung]
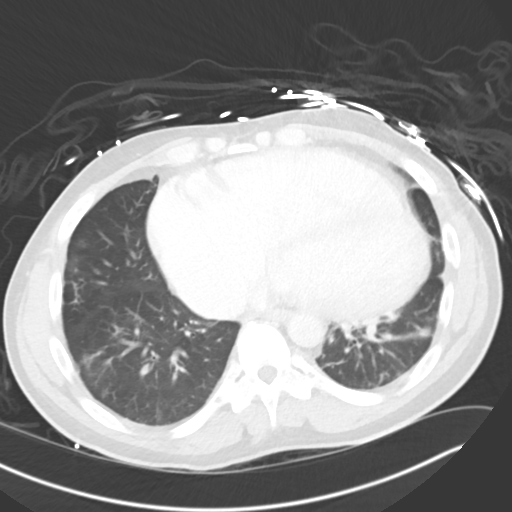

[10 of 46 positions shown; findings below may reference images not displayed]

FINDINGS: VASCULAR

Aorta: Normal caliber with mild atherosclerotic calcification. No
dissection or aneurysm.

Celiac: Patent without evidence of aneurysm, dissection, vasculitis
or significant stenosis. Small calcified spleen. Splenic artery is
diminutive, possibly occluded.

SMA: Patent without evidence of aneurysm, dissection, vasculitis or
significant stenosis.

Renals: Both renal arteries are patent without evidence of aneurysm,
dissection, vasculitis, fibromuscular dysplasia or significant
stenosis.

IMA: Patent without evidence of aneurysm, dissection, vasculitis or
significant stenosis.

Inflow: Normal caliber and intact. Mild atherosclerotic
calcification.

Proximal Outflow: Bilateral common femoral and visualized portions
of the superficial and profunda femoral arteries are patent without
evidence of aneurysm, dissection, vasculitis or significant
stenosis.

Veins: No obvious venous abnormality within the limitations of this
arterial phase study.

Review of the MIP images confirms the above findings.

NON-VASCULAR

Hepatobiliary: Cholecystectomy.  No focal liver lesion.

Pancreas: Unremarkable. No pancreatic ductal dilatation or
surrounding inflammatory changes.

Spleen: Small calcified spleen, typical of sickle cell disease.

Adrenals/Urinary Tract: Both adrenals are normal. Scattered renal
parenchymal scars. Collecting systems and ureters are unremarkable.
Urinary bladder is unremarkable.

Stomach/Bowel: Stomach is within normal limits. Appendix appears
normal. No evidence of bowel wall thickening, distention, or
inflammatory changes.

Lymphatic: No adenopathy in the abdomen or pelvis.

Reproductive: Unremarkable

Other: Umbilical hernia containing unobstructed small bowel.

Musculoskeletal: Scattered bony sclerosis typical of sickle cell
disease. No significant skeletal lesion.
IMPRESSION: VASCULAR

1. Aortic atherosclerosis.
2. No aneurysm or dissection. Major branches of the aorta are also
patent and normal in caliber.

NON-VASCULAR

1. Small calcified spleen, typical of sickle cell disease.
2. Unobstructed small bowel within an umbilical hernia.
3. No acute findings are evident in the abdomen or pelvis.

## 2019-06-29 IMAGING — CR DG CHEST 2V
2 series · 2 of 2 positions shown · non-contrast
Comparison: Radiographs and CT 07/26/2016

CLINICAL DATA: Positive blood cultures.  Rib pain.

EXAM:
CHEST  2 VIEW

[w chest pa]
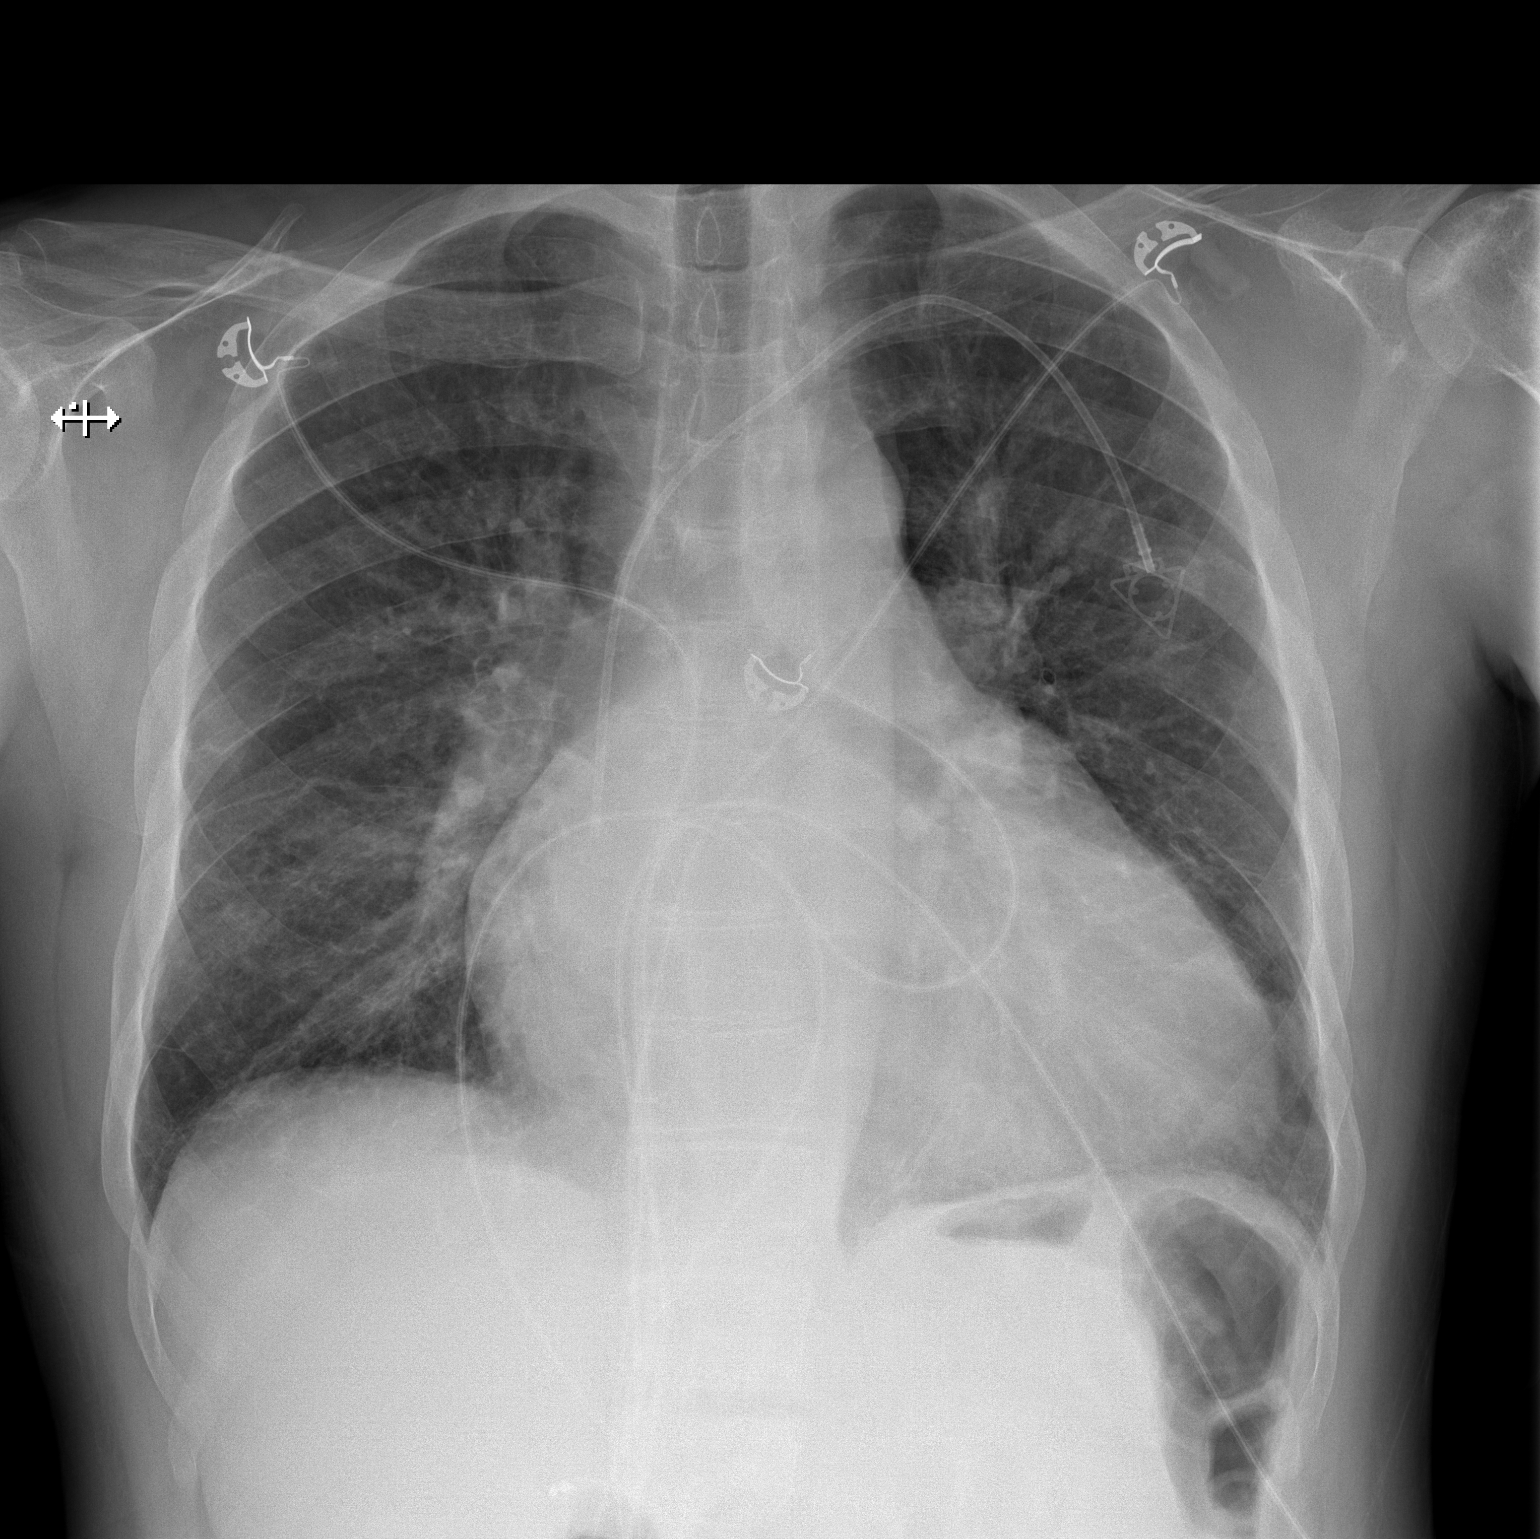

[w chest lat]
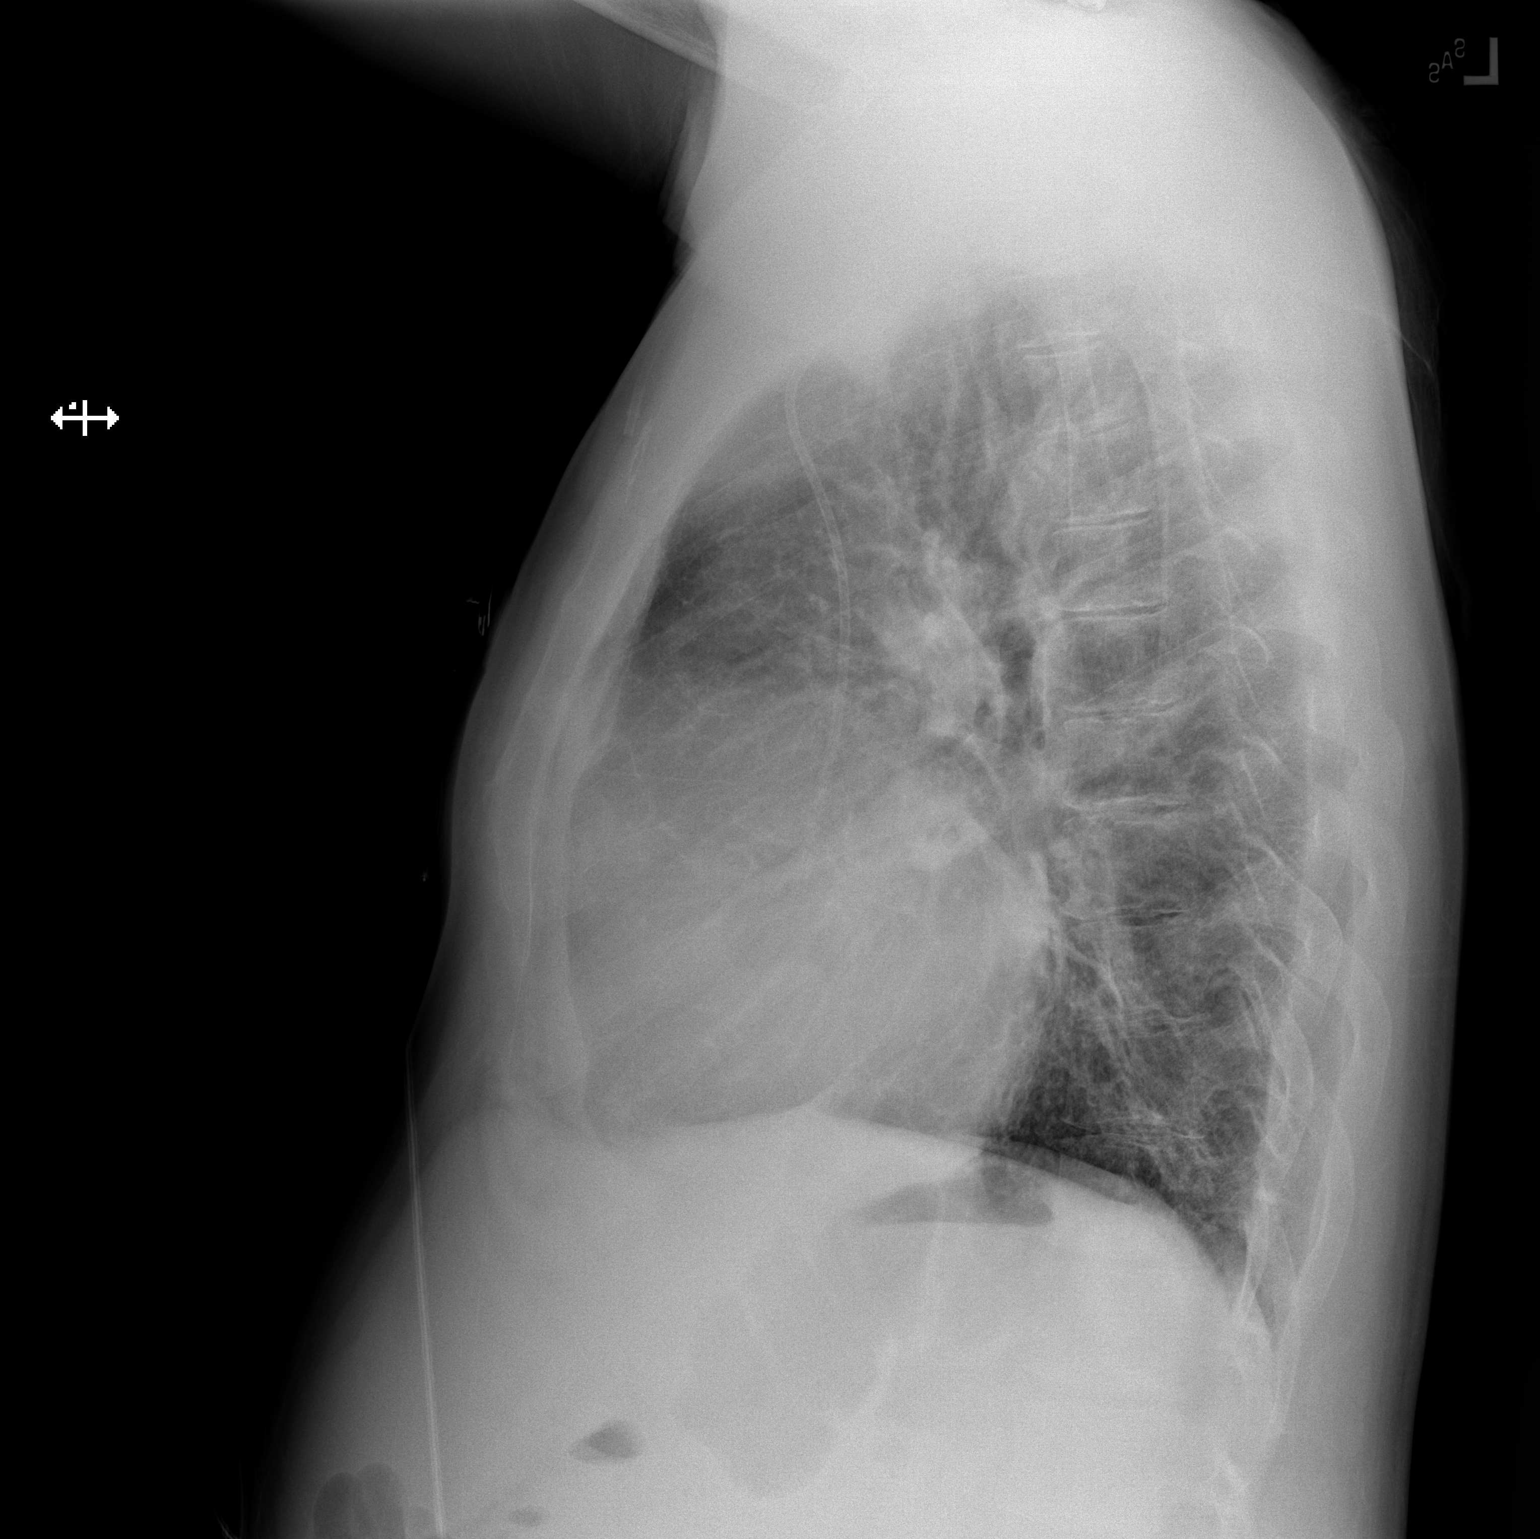

[2 of 2 positions shown; findings below may reference images not displayed]

FINDINGS: Left chest port tip in the mid SVC. Stable cardiomegaly. Mild
central pulmonary edema. No pleural effusion. No focal airspace
disease. No pneumothorax. Stable osseous structures.
IMPRESSION: Stable cardiomegaly with mild pulmonary edema. No focal airspace
disease.

## 2019-08-03 IMAGING — DX DG CHEST 1V PORT
1 series · 1 of 1 positions shown · non-contrast
Comparison: 08/05/2016.

CLINICAL DATA: PICC line placement. Respiratory distress.
Ex-smoker.

EXAM:
PORTABLE CHEST 1 VIEW

[chest ap]
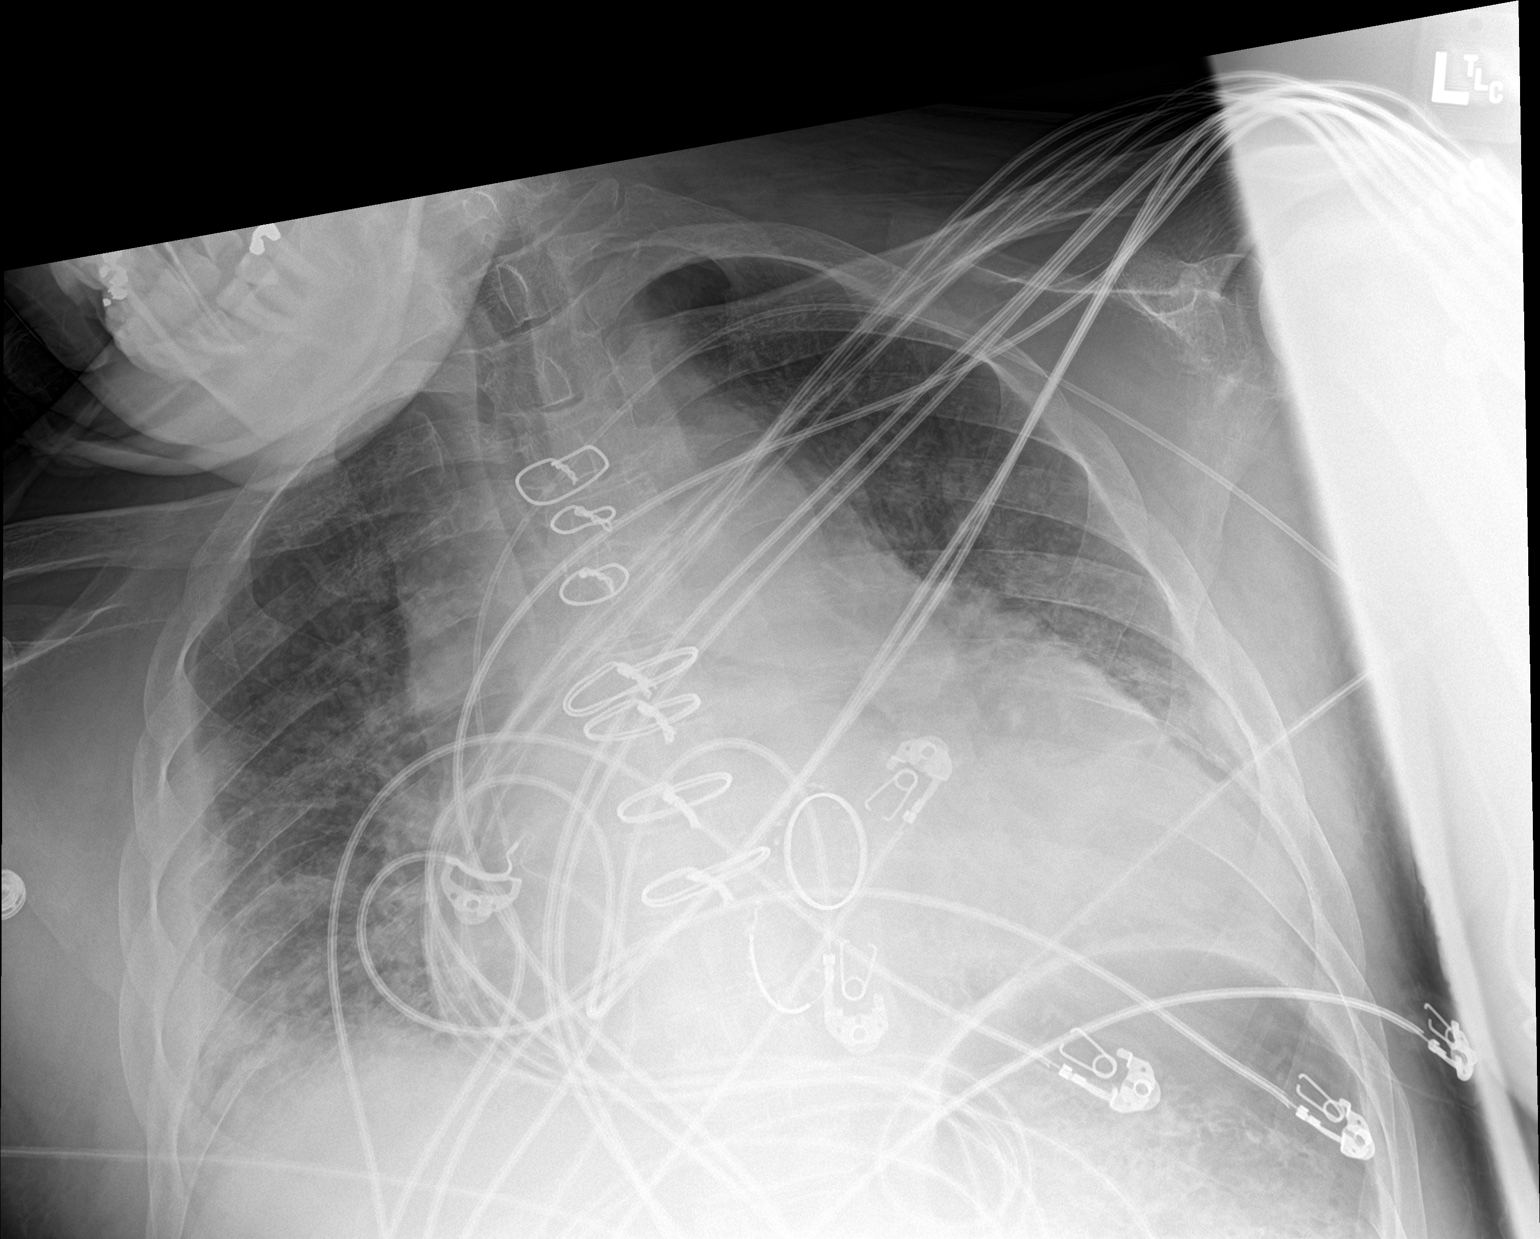

[1 of 1 positions shown; findings below may reference images not displayed]

FINDINGS: Left PICC tip at the junction of the superior vena cava and right
atrium. Interval median sternotomy wires and prosthetic heart valve.
Stable enlarged cardiac silhouette. Mild increase in prominence of
the interstitial markings with an interval small right pleural
effusion. Decreased inspiration with mild right basilar patchy
opacity in minimal left basilar linear density. Unremarkable bones.
IMPRESSION: 1. Left PICC tip in satisfactory position.
2. Interval mild changes of congestive heart failure.
3. Poor inspiration with mild right basilar atelectasis and minimal
left basilar atelectasis.
4. Stable cardiomegaly.

## 2019-08-04 IMAGING — DX DG CHEST 1V PORT
1 series · 1 of 1 positions shown · non-contrast
Comparison: 09/09/2016

CLINICAL DATA: Acute respiratory failure. Acute respiratory
failure.

EXAM:
PORTABLE CHEST 1 VIEW

[chest ap]
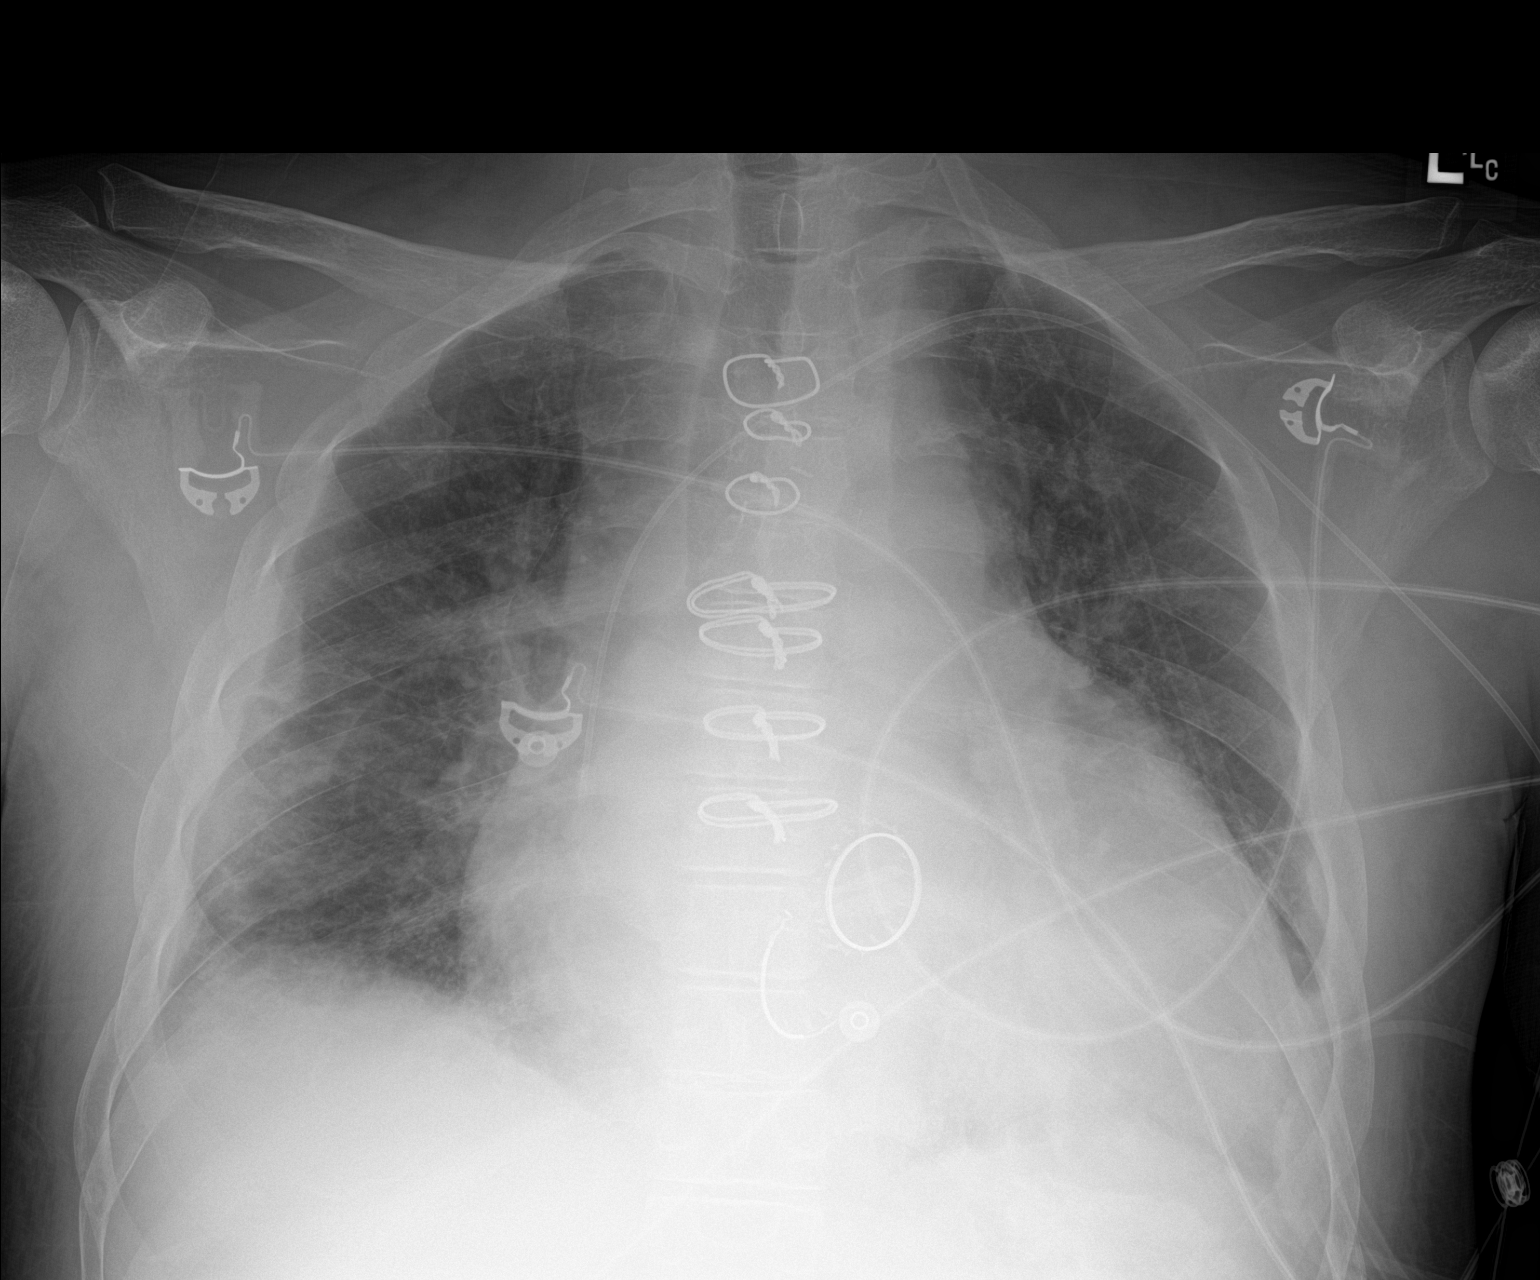

[1 of 1 positions shown; findings below may reference images not displayed]

FINDINGS: The heart is enlarged but stable. Prominent mediastinal and hilar
contours again demonstrated. Improved lung aeration in part due to
better lung volumes. Resolving perihilar pulmonary edema. Persistent
areas of atelectasis and small effusions. No pneumothorax.
IMPRESSION: Stable cardiac enlargement and prominent pulmonary arteries.

Improved lung aeration with resolving pulmonary edema and
atelectasis.

## 2019-08-09 IMAGING — DX DG CHEST 1V PORT
1 series · 1 of 1 positions shown · non-contrast
Comparison: Portable chest x-ray September 10, 2016

CLINICAL DATA: Pulmonary edema history of acute on chronic
respiratory failure, sickle cell anemia, peripheral arterial
thrombosis as well as pulmonary embolism, former smoker.

EXAM:
PORTABLE CHEST 1 VIEW

[chest ap]
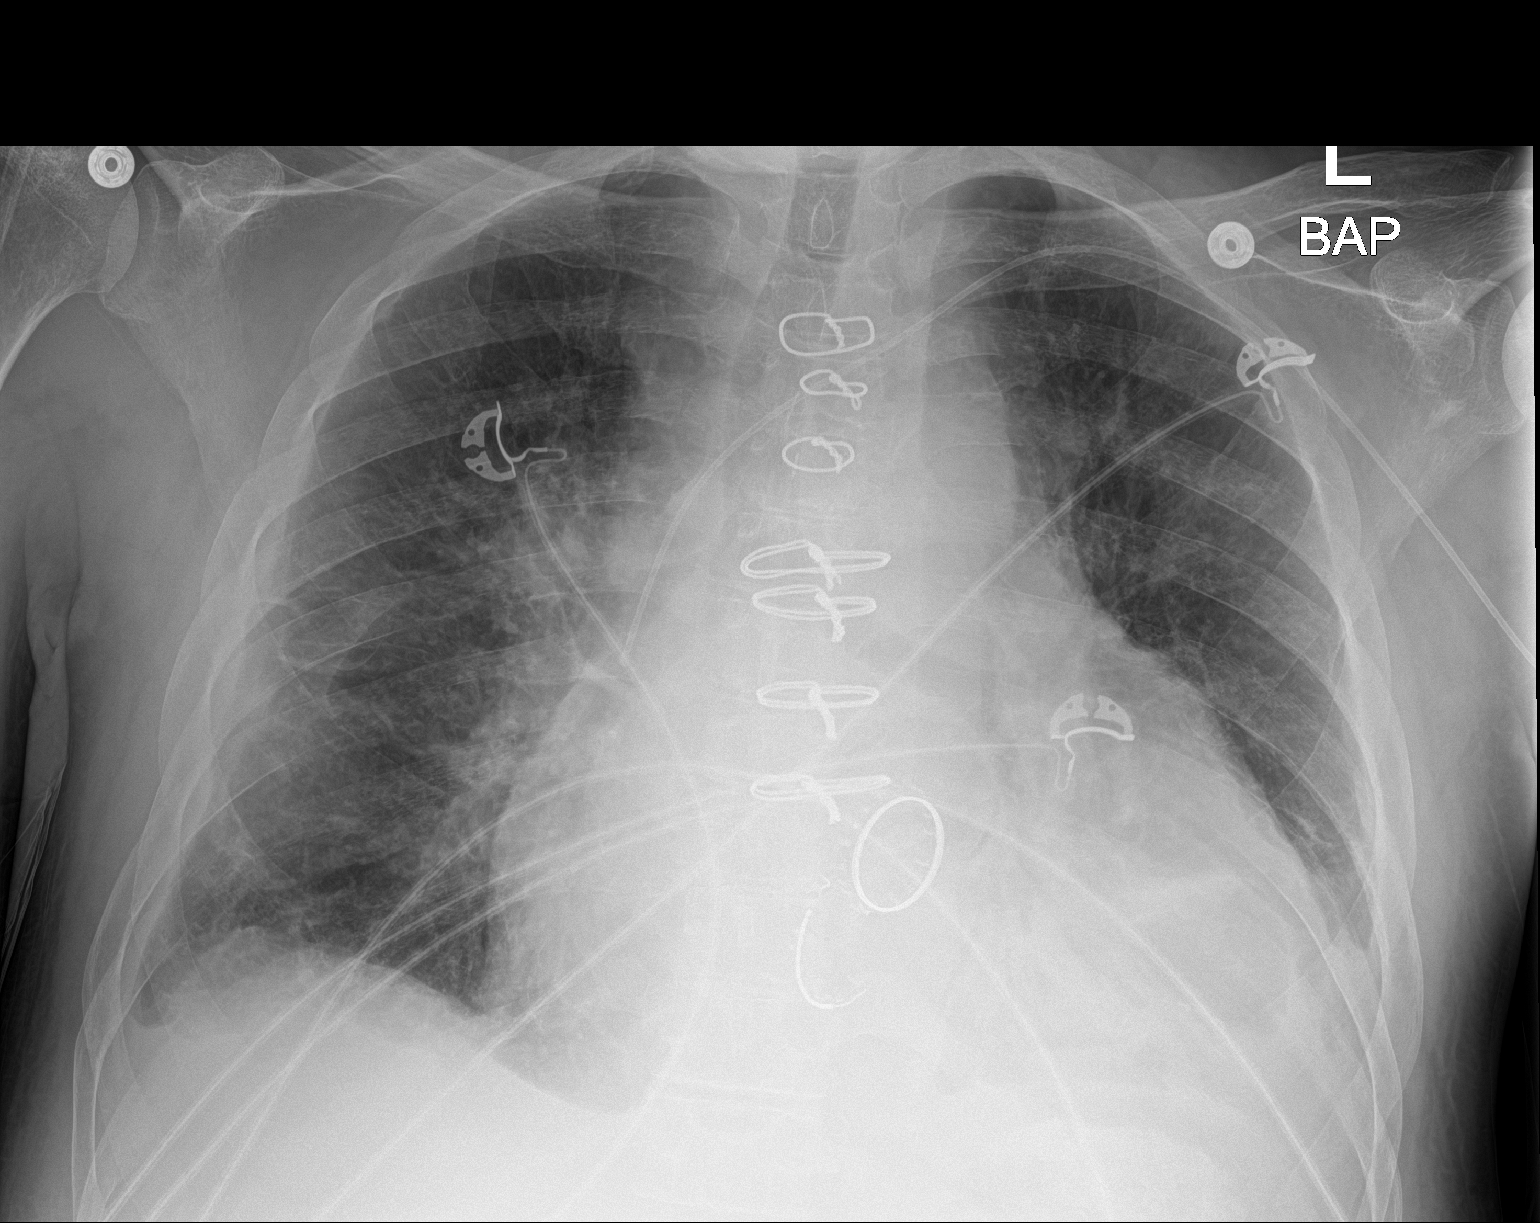

[1 of 1 positions shown; findings below may reference images not displayed]

FINDINGS: The lungs are well-expanded. The interstitial markings remain
increased. There are small bilateral pleural effusions. There is
increasing density in the retrocardiac region on the left. The
pulmonary vascularity is engorged. The cardiac silhouette is
enlarged. The patient has undergone previous mitral and tricuspid
valve replacement. The sternal wires are intact. The bony thorax
exhibits no acute abnormality.
IMPRESSION: CHF with mild pulmonary interstitial edema and small bilateral
pleural effusions. Slight interval increase in retrocardiac density
on the left suggests atelectasis or pneumonia.
